# Patient Record
Sex: Female | Born: 1960
Health system: Southern US, Community
[De-identification: ages and names within clinical notes are randomized; demographics above are authoritative.]

## PROBLEM LIST (undated history)

## (undated) DIAGNOSIS — N289 Disorder of kidney and ureter, unspecified: Secondary | ICD-10-CM

## (undated) DIAGNOSIS — I82409 Acute embolism and thrombosis of unspecified deep veins of unspecified lower extremity: Secondary | ICD-10-CM

## (undated) DIAGNOSIS — E119 Type 2 diabetes mellitus without complications: Secondary | ICD-10-CM

## (undated) DIAGNOSIS — I509 Heart failure, unspecified: Secondary | ICD-10-CM

## (undated) DIAGNOSIS — J449 Chronic obstructive pulmonary disease, unspecified: Secondary | ICD-10-CM

## (undated) DIAGNOSIS — I10 Essential (primary) hypertension: Secondary | ICD-10-CM

## (undated) DIAGNOSIS — I4719 Other supraventricular tachycardia: Secondary | ICD-10-CM

## (undated) DIAGNOSIS — E1142 Type 2 diabetes mellitus with diabetic polyneuropathy: Secondary | ICD-10-CM

## (undated) DIAGNOSIS — E538 Deficiency of other specified B group vitamins: Secondary | ICD-10-CM

## (undated) DIAGNOSIS — N183 Chronic kidney disease, stage 3 unspecified: Secondary | ICD-10-CM

## (undated) DIAGNOSIS — Z720 Tobacco use: Secondary | ICD-10-CM

## (undated) DIAGNOSIS — R778 Other specified abnormalities of plasma proteins: Secondary | ICD-10-CM

## (undated) DIAGNOSIS — F1010 Alcohol abuse, uncomplicated: Secondary | ICD-10-CM

## (undated) DIAGNOSIS — R001 Bradycardia, unspecified: Secondary | ICD-10-CM

## (undated) DIAGNOSIS — I779 Disorder of arteries and arterioles, unspecified: Secondary | ICD-10-CM

## (undated) DIAGNOSIS — N179 Acute kidney failure, unspecified: Secondary | ICD-10-CM

## (undated) DIAGNOSIS — Z9119 Patient's noncompliance with other medical treatment and regimen: Secondary | ICD-10-CM

## (undated) DIAGNOSIS — J189 Pneumonia, unspecified organism: Secondary | ICD-10-CM

## (undated) DIAGNOSIS — Z91199 Patient's noncompliance with other medical treatment and regimen due to unspecified reason: Secondary | ICD-10-CM

## (undated) DIAGNOSIS — E876 Hypokalemia: Secondary | ICD-10-CM

## (undated) DIAGNOSIS — F32A Depression, unspecified: Secondary | ICD-10-CM

## (undated) DIAGNOSIS — R7989 Other specified abnormal findings of blood chemistry: Secondary | ICD-10-CM

## (undated) DIAGNOSIS — I429 Cardiomyopathy, unspecified: Secondary | ICD-10-CM

## (undated) DIAGNOSIS — K219 Gastro-esophageal reflux disease without esophagitis: Secondary | ICD-10-CM

## (undated) DIAGNOSIS — I472 Ventricular tachycardia: Secondary | ICD-10-CM

## (undated) DIAGNOSIS — G4730 Sleep apnea, unspecified: Secondary | ICD-10-CM

## (undated) DIAGNOSIS — I739 Peripheral vascular disease, unspecified: Secondary | ICD-10-CM

## (undated) DIAGNOSIS — F419 Anxiety disorder, unspecified: Secondary | ICD-10-CM

## (undated) DIAGNOSIS — E1165 Type 2 diabetes mellitus with hyperglycemia: Secondary | ICD-10-CM

## (undated) DIAGNOSIS — F1410 Cocaine abuse, uncomplicated: Secondary | ICD-10-CM

## (undated) DIAGNOSIS — I2510 Atherosclerotic heart disease of native coronary artery without angina pectoris: Secondary | ICD-10-CM

## (undated) DIAGNOSIS — F1210 Cannabis abuse, uncomplicated: Secondary | ICD-10-CM

## (undated) DIAGNOSIS — I998 Other disorder of circulatory system: Secondary | ICD-10-CM

## (undated) DIAGNOSIS — R0600 Dyspnea, unspecified: Secondary | ICD-10-CM

## (undated) DIAGNOSIS — D649 Anemia, unspecified: Secondary | ICD-10-CM

## (undated) DIAGNOSIS — K7030 Alcoholic cirrhosis of liver without ascites: Secondary | ICD-10-CM

## (undated) DIAGNOSIS — I4729 Other ventricular tachycardia: Secondary | ICD-10-CM

## (undated) DIAGNOSIS — I480 Paroxysmal atrial fibrillation: Secondary | ICD-10-CM

## (undated) DIAGNOSIS — E669 Obesity, unspecified: Secondary | ICD-10-CM

## (undated) DIAGNOSIS — M1990 Unspecified osteoarthritis, unspecified site: Secondary | ICD-10-CM

## (undated) DIAGNOSIS — E785 Hyperlipidemia, unspecified: Secondary | ICD-10-CM

## (undated) DIAGNOSIS — I5042 Chronic combined systolic (congestive) and diastolic (congestive) heart failure: Secondary | ICD-10-CM

## (undated) DIAGNOSIS — E039 Hypothyroidism, unspecified: Secondary | ICD-10-CM

## (undated) DIAGNOSIS — I70229 Atherosclerosis of native arteries of extremities with rest pain, unspecified extremity: Secondary | ICD-10-CM

## (undated) DIAGNOSIS — I471 Supraventricular tachycardia: Secondary | ICD-10-CM

## (undated) DIAGNOSIS — L0390 Cellulitis, unspecified: Secondary | ICD-10-CM

## (undated) DIAGNOSIS — F1110 Opioid abuse, uncomplicated: Secondary | ICD-10-CM

## (undated) DIAGNOSIS — F329 Major depressive disorder, single episode, unspecified: Secondary | ICD-10-CM

## (undated) HISTORY — DX: Cardiomyopathy, unspecified: I42.9

## (undated) HISTORY — DX: Obesity, unspecified: E66.9

## (undated) HISTORY — DX: Alcohol abuse, uncomplicated: F10.10

## (undated) HISTORY — DX: Hypomagnesemia: E83.42

## (undated) HISTORY — DX: Supraventricular tachycardia: I47.1

## (undated) HISTORY — DX: Disorder of kidney and ureter, unspecified: N28.9

## (undated) HISTORY — DX: Patient's noncompliance with other medical treatment and regimen due to unspecified reason: Z91.199

## (undated) HISTORY — DX: Other ventricular tachycardia: I47.29

## (undated) HISTORY — DX: Cocaine abuse, uncomplicated: F14.10

## (undated) HISTORY — DX: Atherosclerosis of native arteries of extremities with rest pain, unspecified extremity: I70.229

## (undated) HISTORY — DX: Paroxysmal atrial fibrillation: I48.0

## (undated) HISTORY — DX: Cannabis abuse, uncomplicated: F12.10

## (undated) HISTORY — DX: Patient's noncompliance with other medical treatment and regimen: Z91.19

## (undated) HISTORY — DX: Opioid abuse, uncomplicated: F11.10

## (undated) HISTORY — DX: Chronic obstructive pulmonary disease, unspecified: J44.9

## (undated) HISTORY — DX: Other supraventricular tachycardia: I47.19

## (undated) HISTORY — DX: Peripheral vascular disease, unspecified: I73.9

## (undated) HISTORY — DX: Ventricular tachycardia: I47.2

## (undated) HISTORY — DX: Deficiency of other specified B group vitamins: E53.8

## (undated) HISTORY — DX: Disorder of arteries and arterioles, unspecified: I77.9

## (undated) HISTORY — DX: Anemia, unspecified: D64.9

## (undated) HISTORY — DX: Type 2 diabetes mellitus without complications: E11.9

## (undated) HISTORY — PX: CARDIAC CATHETERIZATION: SHX172

## (undated) HISTORY — DX: Other disorder of circulatory system: I99.8

## (undated) HISTORY — DX: Chronic combined systolic (congestive) and diastolic (congestive) heart failure: I50.42

## (undated) HISTORY — DX: Hypokalemia: E87.6

## (undated) HISTORY — DX: Alcoholic cirrhosis of liver without ascites: K70.30

## (undated) HISTORY — DX: Tobacco use: Z72.0

## (undated) HISTORY — PX: CARDIOVERSION: SHX1299

---

## 1986-11-14 HISTORY — PX: DILATION AND CURETTAGE OF UTERUS: SHX78

## 2013-05-13 ENCOUNTER — Ambulatory Visit: Payer: Medicaid Other | Attending: Family Medicine | Admitting: Family Medicine

## 2013-05-13 ENCOUNTER — Encounter: Payer: Self-pay | Admitting: Family Medicine

## 2013-05-13 VITALS — BP 131/85 | HR 67 | Temp 98.8°F | Resp 18 | Ht 64.0 in | Wt 152.4 lb

## 2013-05-13 DIAGNOSIS — K7030 Alcoholic cirrhosis of liver without ascites: Secondary | ICD-10-CM | POA: Insufficient documentation

## 2013-05-13 DIAGNOSIS — I429 Cardiomyopathy, unspecified: Secondary | ICD-10-CM

## 2013-05-13 DIAGNOSIS — F17200 Nicotine dependence, unspecified, uncomplicated: Secondary | ICD-10-CM

## 2013-05-13 DIAGNOSIS — F1110 Opioid abuse, uncomplicated: Secondary | ICD-10-CM | POA: Insufficient documentation

## 2013-05-13 DIAGNOSIS — F1210 Cannabis abuse, uncomplicated: Secondary | ICD-10-CM | POA: Insufficient documentation

## 2013-05-13 DIAGNOSIS — E1165 Type 2 diabetes mellitus with hyperglycemia: Secondary | ICD-10-CM

## 2013-05-13 DIAGNOSIS — Z720 Tobacco use: Secondary | ICD-10-CM

## 2013-05-13 DIAGNOSIS — J449 Chronic obstructive pulmonary disease, unspecified: Secondary | ICD-10-CM

## 2013-05-13 DIAGNOSIS — E1169 Type 2 diabetes mellitus with other specified complication: Secondary | ICD-10-CM | POA: Insufficient documentation

## 2013-05-13 DIAGNOSIS — F1010 Alcohol abuse, uncomplicated: Secondary | ICD-10-CM | POA: Insufficient documentation

## 2013-05-13 DIAGNOSIS — F1310 Sedative, hypnotic or anxiolytic abuse, uncomplicated: Secondary | ICD-10-CM

## 2013-05-13 DIAGNOSIS — I5042 Chronic combined systolic (congestive) and diastolic (congestive) heart failure: Secondary | ICD-10-CM

## 2013-05-13 DIAGNOSIS — E1151 Type 2 diabetes mellitus with diabetic peripheral angiopathy without gangrene: Secondary | ICD-10-CM | POA: Insufficient documentation

## 2013-05-13 DIAGNOSIS — I428 Other cardiomyopathies: Secondary | ICD-10-CM

## 2013-05-13 DIAGNOSIS — R0609 Other forms of dyspnea: Secondary | ICD-10-CM | POA: Insufficient documentation

## 2013-05-13 DIAGNOSIS — E119 Type 2 diabetes mellitus without complications: Secondary | ICD-10-CM | POA: Insufficient documentation

## 2013-05-13 DIAGNOSIS — I4891 Unspecified atrial fibrillation: Secondary | ICD-10-CM

## 2013-05-13 MED ORDER — POTASSIUM CHLORIDE ER 10 MEQ PO TBCR: 10.0000 meq | EXTENDED_RELEASE_TABLET | Freq: Every day | ORAL | Status: DC

## 2013-05-13 NOTE — Progress Notes (Signed)
Pt here to establish new pt care. Pt is from [facility], with extensive medical hx. Is now living with sister to help with care. C/o nausea after eating. vss

## 2013-05-13 NOTE — Patient Instructions (Addendum)
Cirrhosis Cirrhosis is a condition of scarring of the liver which is caused when the liver has tried repairing itself following damage. This damage may come from a previous infection such as one of the forms of hepatitis (usually hepatitis C), or the damage may come from being injured by toxins. The main toxin that causes this damage is alcohol. The scarring of the liver from use of alcohol is irreversible. That means the liver cannot return to normal even though alcohol is not used any more. The main danger of hepatitis C infection is that it may cause long-lasting (chronic) liver disease, and this also may lead to cirrhosis. This complication is progressive and irreversible. CAUSES  Prior to available blood tests, hepatitis C could be contracted by blood transfusions. Since testing of blood has improved, this is now unlikely. This infection can also be contracted through intravenous drug use and the sharing of needles. It can also be contracted through sexual relationships. The injury caused by alcohol comes from too much use. It is not a few drinks that poison the liver, but years of misuse. Usually there will be some signs and symptoms early with scarring of the liver that suggest the development of better habits. Alcohol should never be used while using acetaminophen. A small dose of both taken together may cause irreversible damage to the liver. HOME CARE INSTRUCTIONS  There is no specific treatment for cirrhosis. However, there are things you can do to avoid making the condition worse.  Rest as needed.  Eat a well-balanced diet. Your caregiver can help you with suggestions.  Vitamin supplements including vitamins A, K, D, and thiamine can help.  A low-salt diet, water restriction, or diuretic medicine may be needed to reduce fluid retention.  Avoid alcohol. This can be extremely toxic if combined with acetaminophen.  Avoid drugs which are toxic to the liver. Some of these include isoniazid,  methyldopa, acetaminophen, anabolic steroids (muscle-building drugs), erythromycin, and oral contraceptives (birth control pills). Check with your caregiver to make sure medicines you are presently taking will not be harmful.  Periodic blood tests may be required. Follow your caregiver's advice regarding the timing of these.  Milk thistle is an herbal remedy which does protect the liver against toxins. However, it will not help once the liver has been scarred. SEEK MEDICAL CARE IF:  You have increasing fatigue or weakness.  You develop swelling of the hands, feet, legs, or face.  You vomit bright red blood, or a coffee ground appearing material.  You have blood in your stools, or the stools turn black and tarry.  You have a fever.  You develop loss of appetite, or have nausea and vomiting.  You develop jaundice.  You develop easy bruising or bleeding.  You have worsening of any of the problems you are concerned about. Document Released: 10/31/2005 Document Revised: 01/23/2012 Document Reviewed: 06/18/2008 [facility] Patient Information 2014 [place]. Smoking Cessation, Tips for Success YOU CAN QUIT SMOKING If you are ready to quit smoking, congratulations! You have chosen to help yourself be healthier. Cigarettes bring nicotine, tar, carbon monoxide, and other irritants into your body. Your lungs, heart, and blood vessels will be able to work better without these poisons. There are many different ways to quit smoking. Nicotine gum, nicotine patches, a nicotine inhaler, or nicotine nasal spray can help with physical craving. Hypnosis, support groups, and medicines help break the habit of smoking. Here are some tips to help you quit for good.  Throw away all  cigarettes.  Clean and remove all ashtrays from your home, work, and car.  On a card, write down your reasons for quitting. Carry the card with you and read it when you get the urge to smoke.  Cleanse your body of nicotine.  Drink enough water and fluids to keep your urine clear or pale yellow. Do this after quitting to flush the nicotine from your body.  Learn to predict your moods. Do not let a bad situation be your excuse to have a cigarette. Some situations in your life might tempt you into wanting a cigarette.  Never have "just one" cigarette. It leads to wanting another and another. Remind yourself of your decision to quit.  Change habits associated with smoking. If you smoked while driving or when feeling stressed, try other activities to replace smoking. Stand up when drinking your coffee. Brush your teeth after eating. Sit in a different chair when you read the paper. Avoid alcohol while trying to quit, and try to drink fewer caffeinated beverages. Alcohol and caffeine may urge you to smoke.  Avoid foods and drinks that can trigger a desire to smoke, such as sugary or spicy foods and alcohol.  Ask people who smoke not to smoke around you.  Have something planned to do right after eating or having a cup of coffee. Take a walk or exercise to perk you up. This will help to keep you from overeating.  Try a relaxation exercise to calm you down and decrease your stress. Remember, you may be tense and nervous for the first 2 weeks after you quit, but this will pass.  Find new activities to keep your hands busy. Play with a pen, coin, or rubber band. Doodle or draw things on paper.  Brush your teeth right after eating. This will help cut down on the craving for the taste of tobacco after meals. You can try mouthwash, too.  Use oral substitutes, such as lemon drops, carrots, a cinnamon stick, or chewing gum, in place of cigarettes. Keep them handy so they are available when you have the urge to smoke.  When you have the urge to smoke, try deep breathing.  Designate your home as a nonsmoking area.  If you are a heavy smoker, ask your caregiver about a prescription for nicotine chewing gum. It can ease your  withdrawal from nicotine.  Reward yourself. Set aside the cigarette money you save and buy yourself something nice.  Look for support from others. Join a support group or smoking cessation program. Ask someone at home or at work to help you with your plan to quit smoking.  Always ask yourself, "Do I need this cigarette or is this just a reflex?" Tell yourself, "Today, I choose not to smoke," or "I do not want to smoke." You are reminding yourself of your decision to quit, even if you do smoke a cigarette. HOW WILL I FEEL WHEN I QUIT SMOKING?  The benefits of not smoking start within days of quitting.  You may have symptoms of withdrawal because your body is used to nicotine (the addictive substance in cigarettes). You may crave cigarettes, be irritable, feel very hungry, cough often, get headaches, or have difficulty concentrating.  The withdrawal symptoms are only temporary. They are strongest when you first quit but will go away within 10 to 14 days.  When withdrawal symptoms occur, stay in control. Think about your reasons for quitting. Remind yourself that these are signs that your body is healing and getting  used to being without cigarettes.  Remember that withdrawal symptoms are easier to treat than the major diseases that smoking can cause.  Even after the withdrawal is over, expect periodic urges to smoke. However, these cravings are generally short-lived and will go away whether you smoke or not. Do not smoke!  If you relapse and smoke again, do not lose hope. Most smokers quit 3 times before they are successful.  If you relapse, do not give up! Plan ahead and think about what you will do the next time you get the urge to smoke. LIFE AS A NONSMOKER: MAKE IT FOR A MONTH, MAKE IT FOR LIFE Day 1: Hang this page where you will see it every day. Day 2: Get rid of all ashtrays, matches, and lighters. Day 3: Drink water. Breathe deeply between sips. Day 4: Avoid places with smoke-filled  air, such as bars, clubs, or the smoking section of restaurants. Day 5: Keep track of how much money you save by not smoking. Day 6: Avoid boredom. Keep a good book with you or go to the movies. Day 7: Reward yourself! One week without smoking! Day 8: Make a dental appointment to get your teeth cleaned. Day 9: Decide how you will turn down a cigarette before it is offered to you. Day 10: Review your reasons for quitting. Day 11: Distract yourself. Stay active to keep your mind off smoking and to relieve tension. Take a walk, exercise, read a book, do a crossword puzzle, or try a new hobby. Day 12: Exercise. Get off the bus before your stop or use stairs instead of escalators. Day 13: Call on friends for support and encouragement. Day 14: Reward yourself! Two weeks without smoking! Day 15: Practice deep breathing exercises. Day 16: Bet a friend that you can stay a nonsmoker. Day 17: Ask to sit in nonsmoking sections of restaurants. Day 18: Hang up "No Smoking" signs. Day 19: Think of yourself as a nonsmoker. Day 20: Each morning, tell yourself you will not smoke. Day 21: Reward yourself! Three weeks without smoking! Day 22: Think of smoking in negative ways. Remember how it stains your teeth, gives you bad breath, and leaves you short of breath. Day 23: Eat a nutritious breakfast. Day 24:Do not relive your days as a smoker. Day 25: Hold a pencil in your hand when talking on the telephone. Day 26: Tell all your friends you do not smoke. Day 27: Think about how much better food tastes. Day 28: Remember, one cigarette is one too many. Day 29: Take up a hobby that will keep your hands busy. Day 30: Congratulations! One month without smoking! Give yourself a big reward. Your caregiver can direct you to community resources or hospitals for support, which may include:  Group support.  Education.  Hypnosis.  Subliminal therapy. Document Released: 07/29/2004 Document Revised: 01/23/2012  Document Reviewed: 08/17/2009 [facility] Patient Information 2014 [place], [place]. Cardiomyopathy Cardiomyopathy means a disease of the heart muscle. The heart muscle becomes enlarged or stiff. The heart is not able to pump enough blood or deliver enough oxygen to the body. This leads to heart failure and is the number one reason for heart transplants.  TYPES OF CARDIOMYOPATHY INCLUDE: DILATED  The most common type. The heart muscle is stretched out and weak so there is less blood pumped out.   Some causes:  Disease of the arteries of the heart (ischemia).  Heart attack with muscle scar.  Leaky or damaged valves.  After a viral illness.  Smoking.  High cholesterol.  Diabetes or overactive thyroid.  Alcohol or drug abuse.  High blood pressure.  May be reversible. HYPERTROPHIC The heart muscle grows bigger so there is less room for blood in the ventricle, and not enough blood is pumped out.   Causes include:  Mitral valve leaks.  Inherited tendency (from your family).  No explanation (idiopathic).  May be a cause of sudden death in young athletes with no symptoms. RESTRICTIVE The heart muscle becomes stiff, but not always larger. The heart has to work harder and will get weaker. Abnormal heart beats or rhythm (arrhythmia) are common.  Some causes:  Diseases in other parts of the body which may produce abnormal deposits in the heart muscle.  Probably not inherited.  A result of radiation treatment for cancer. SYMPTOMS OF ALL TYPES:  Less able to exercise or tolerate physical activity.  Palpitations.  Irregular heart beat, heart arrhythmias.  Shortness of breath, even at rest.  Chest pain.  Lightheadedness or fainting. TREATMENT  Life-style changes including reducing salt, lowering cholesterol, stop smoking.  Manage contributing causes with medications.  Medicines to help reduce the fluids in the body.  An implanted cardioverter defibrillator (ICD) to  improve heart function and correct arrhythmias.  Medications to relax the blood vessels and make it easier for the heart to pump.  Drugs that help regulate heart beat and improve heart relaxation, reducing the work of the heart.  Myomectomy for patients with hypertrophic cardiomyopathy and severe problems. This is a surgical procedure that removes a portion of the thickened muscle wall in order to improve heart output and provide symptom relief.  A heart transplant is an option in carefully applied circumstances. SEEK IMMEDIATE MEDICAL CARE IF:   You have severe chest pain, especially if the pain is crushing or pressure-like and spreads to the arms, back, neck, or jaw, or if you have sweating, feeling sick to your stomach (nausea), or shortness of breath. THIS IS AN EMERGENCY. Do not wait to see if the pain will go away. Get medical help at once. Call your local emergency services (911 in [place] DO NOT drive yourself to the hospital.  You develop severe shortness of breath.  You begin to cough up bloody sputum.  You are unable to sleep because you cannot breathe.  You gain weight due to fluid retention.  You develop painful swelling in your calf or leg.  You feel your heart racing and it does not go away or happens when you are resting. Document Released: 01/13/2005 Document Revised: 01/23/2012 Document Reviewed: 06/18/2008 [facility] Patient Information 2014 [place]. Heart Failure Heart failure means your heart has trouble pumping blood. This makes it hard for your body to work well. Heart failure is a long-term (chronic) condition. You must take good care of yourself and follow your doctor's treatment plan. HOME CARE  Take your heart medicine as told by your doctor.  Do not stop taking medicine unless your doctor tells you to.  Do not skip any dose of medicine.  Refill your medicines before they run out.  Only take other medicines after your doctor approves.  Stay  active and follow your activity program as told by your doctor.  Rest for 1 hour before and after meals.  Eat heart healthy foods. This includes fresh or frozen fruits and vegetables, fish, lean meats, fat-free or low-fat dairy foods, whole grains, and high-fiber foods.  Do not eat more than 1500 milligrams of salt each day or as told  by your doctor.  Cook in a healthy way. Roast, grill, broil, bake, poach, steam, or stir-fry foods.  Limit fluids as told by your doctor.  Weigh yourself every morning. Do this after you pee (urinate) and before you eat breakfast. Write down your weight to give to your doctor.  Take your blood pressure and write it down if your doctor tell you to.  Ask your doctor how to check your pulse. Check your pulse as told.  Lose weight if you are overweight. Maintain a healthy weight.  Stop smoking or chewing tobacco. Do not use gum or patches that help you quit without your doctor's approval.  Schedule and go to doctor visits as told.  Nonpregnant women should have no more than 1 drink a day. Men should have no more than 2 drinks a day. Talk to your doctor about drinking alcohol.  Stop drug use.  Stay current with shots (immunizations).  Manage your health conditions as told by your doctor.  Learn to manage your stress.  Rest when you are tired.  If it is really hot outside:  Avoid intense activities.  Use air conditioning or fans, or get in a cooler place.  Avoid caffeine and alcohol.  Wear loose-fitting, lightweight, and light-colored clothing.  If it is really cold outside:  Avoid intense activities.  Layer your clothing.  Wear mittens or gloves, a hat, and a scarf when going outside.  Avoid alcohol.  Learn about heart failure and get support as needed.  Get help to maintain or improve your quality of life and your ability to care for yourself as needed. GET HELP IF:   You gain 3 lb/1.4 kg or more in 1 day or 5 lb/2.3 kg in a  week.  You are more short of breath than usual.  You cannot do your normal activities.  You tire easily.  You cough more than normal, especially with activity.  You have any or more puffiness (swelling) in areas such as your hands, feet, ankles, or belly (abdomen).  You cannot sleep because it is hard to breathe.  You cough up thick spit (mucus) that is bloody.  You feel like your heart is beating fast (palpitations).  You get dizzy or lightheaded when you stand up. GET HELP RIGHT AWAY IF:   You have trouble breathing.  There is a change in mental status, such as becoming less alert or not being able to focus.  You have chest pain or discomfort.  You faint.  You were outside in hot weather and show signs of your body overheating (heat exhaustion):  Heavy sweating.  Muscle cramps.  Weakness.  Dizziness.  Headaches.  Fainting.  You were outside in cold weather and show signs of low body temperature (hypothermia):  Clumsiness.  Confusion.  Sleepiness.  Shivering. MAKE SURE YOU:   Understand these instructions.  Will watch your condition.  Will get help right away if you are not doing well or get worse. Document Released: 08/09/2008 Document Revised: 10/17/2012 Document Reviewed: 05/31/2012 [facility] Patient Information 2014 [place], [place].

## 2013-05-13 NOTE — Progress Notes (Signed)
Quick Note:  Pt already notified in person: Continue current dose of warfarin and recheck on Thursday INR  Gerlene Fee, MD, CDE, [place], [place]   ______

## 2013-05-13 NOTE — Progress Notes (Signed)
05/13/13 Already taken care of per Dr. Wynetta Emery. P.Abella Shugart,RN BSN [facility]

## 2013-05-13 NOTE — Progress Notes (Signed)
Patient ID: [name], female   DOB: Mar 06, 1961, 52 y.o.   MRN: [id number]  CC: Hospital followup  HPI: The patient was recently discharged from a hospital stay in [place].  She was very ill.  She had a cardiac arrest and required resuscitation.  She was also found to have A. fib with RVR and started on warfarin.  She has a history of polysubstance abuse.  She has alcoholic cirrhosis.  She has uncontrolled diabetes mellitus which was discovered in the hospital.  She is now living in the area with her sister.  Her sister is helping her.  She has not establish cardiology care since she has been in [place].  She reports no symptoms.  She reports that she is taking her diuretics but she is losing continence of urine on the diuretics.  She is currently taking Lasix and spironolactone.  She's also taking amiodarone.  The patient is a very poor historian.  She did bring some medical records with her today.  No Known Allergies Past Medical History  Diagnosis Date  . COPD (chronic obstructive pulmonary disease)   . Alcohol abuse   . Narcotic abuse   . Cocaine abuse   . Marijuana abuse   . Poorly controlled diabetes mellitus   . COPD (chronic obstructive pulmonary disease)   . Tobacco abuse   . Atrial fibrillation   . Heart failure   . Alcoholic cirrhosis   . Cardiomyopathy    No current outpatient prescriptions on file prior to visit.   No current facility-administered medications on file prior to visit.   Family History  Problem Relation Age of Onset  . Hypertension Mother   . Hypertension Father   . Diabetes Mother   . Cancer Mother     ovarian   History   Social History  . Marital Status: Divorced    Spouse Name: N/A    Number of Children: N/A  . Years of Education: N/A   Occupational History  . disabled    Social History Main Topics  . Smoking status: Current Every Day Smoker    Types: Cigarettes  . Smokeless tobacco: Not on file  . Alcohol Use: Yes  . Drug  Use: Yes  . Sexually Active: Not Currently   Other Topics Concern  . Not on file   Social History Narrative  . No narrative on file    Review of Systems  Constitutional: Negative for fever, chills, diaphoresis, activity change, reports poor appetite change and chronic fatigue.  HENT: Negative for ear pain, nosebleeds, congestion, facial swelling, rhinorrhea, neck pain, neck stiffness and ear discharge.   Eyes: Negative for pain, discharge, redness, itching and visual disturbance.  Respiratory: Negative for cough, choking, chest tightness, shortness of breath, wheezing and stridor.   Cardiovascular: Negative for chest pain, palpitations and leg swelling.  Gastrointestinal: Negative for abdominal distention.  Genitourinary: Negative for dysuria, urgency, frequency, hematuria, flank pain, decreased urine volume, difficulty urinating and dyspareunia.  Musculoskeletal: Negative for back pain, joint swelling, arthralgias and gait problem.  edema in the lower extremities Neurological: Negative for dizziness, tremors, seizures, syncope, facial asymmetry, speech difficulty, weakness, light-headedness, numbness and headaches.  Hematological: Negative for adenopathy. Does not bruise/bleed easily.  Psychiatric/Behavioral: Negative for hallucinations, behavioral problems, confusion, dysphoric mood, decreased concentration and agitation.    Objective:   Filed Vitals:   05/13/13 1145  BP: 131/85  Pulse: 67  Temp: 98.8 F (37.1 C)  Resp: 18    Physical Exam  Constitutional:  Appears chronically ill, well-developed and well-nourished. No distress.  HENT: Normocephalic. External right and left ear normal. Oropharynx is clear and moist.  Eyes: Conjunctivae and EOM are normal. PERRLA, no scleral icterus.  Neck: Normal ROM. Neck supple. No JVD. No tracheal deviation. No thyromegaly.  CVS: RRR, S1/S2 +, no murmurs, no gallops, no carotid bruit.  Pulmonary: Effort and breath sounds normal, no  stridor, rhonchi, wheezes, rales.  Abdominal: Soft. BS +,  no distension, tenderness, rebound or guarding.  Musculoskeletal: Contracted skin in the lower extremities from rapid loss of fluid, chronic venous stasis changes in both lower extremities.  Lymphadenopathy: No lymphadenopathy noted, cervical, inguinal. Neuro: Alert. Normal reflexes, muscle tone coordination. No cranial nerve deficit. Skin: Skin is warm and dry. No rash noted. Not diaphoretic. No erythema. No pallor.  Psychiatric: Normal mood and affect. Behavior, judgment, thought content normal.   No results found for this basename: WBC, HGB, HCT, MCV, PLT   No results found for this basename: CREATININE, BUN, NA, K, CL, CO2   No results found for this basename: HGBA1C   Lipid Panel  No results found for this basename: chol, trig, hdl, cholhdl, vldl, ldlcalc     Assessment and plan:   Patient Active Problem List   Diagnosis Date Noted  . COPD (chronic obstructive pulmonary disease)   . Alcohol abuse   . Narcotic abuse   . Marijuana abuse   . Cardiomyopathy   . Alcoholic cirrhosis   . Heart failure   . Atrial fibrillation   . Tobacco abuse   . Poorly controlled diabetes mellitus    Check PT/INR today Check for discharge summary Referral to cardiology and GI for evaluation.  She will need close followup with a local cardiologist given the complexity of her heart disease.  At this time we'll continue the current medications that she is on.  Monitor PT/INR.  RTC in 2 weeks for office visit  Recheck PT/INR in 3 days  The patient was given clear instructions to go to ER or return to medical center if symptoms don't improve, worsen or new problems develop.  The patient verbalized understanding.  The patient was told to call to get any lab results if not heard anything in the next week.    Gerlene Fee, MD, CDE, [place], [place]

## 2013-05-16 ENCOUNTER — Ambulatory Visit: Payer: Self-pay | Attending: Family Medicine

## 2013-05-16 NOTE — Progress Notes (Unsigned)
Patient ID: [name], female   DOB: Jun 07, 1961, 52 y.o.   MRN: [id number]  INR subtherapeutic at 1.5 Instructed to take 5 mg in 2.5 mg every other day, patient instructed she 5 mg today on 7/3 Repeat INR 7/7

## 2013-05-20 ENCOUNTER — Ambulatory Visit: Payer: Medicaid Other | Attending: Family Medicine

## 2013-05-20 DIAGNOSIS — Z7901 Long term (current) use of anticoagulants: Secondary | ICD-10-CM

## 2013-05-20 LAB — POCT INR: INR: 1.7

## 2013-05-20 NOTE — Patient Instructions (Signed)
Patient instructed to start taking 5 mg per day and come back Thursday to repeat INR per Dr. Wynelle Cleveland.

## 2013-05-23 ENCOUNTER — Ambulatory Visit: Payer: Medicaid Other | Attending: Family Medicine

## 2013-05-23 DIAGNOSIS — E119 Type 2 diabetes mellitus without complications: Secondary | ICD-10-CM

## 2013-05-23 DIAGNOSIS — E1165 Type 2 diabetes mellitus with hyperglycemia: Secondary | ICD-10-CM

## 2013-05-23 DIAGNOSIS — Z7901 Long term (current) use of anticoagulants: Secondary | ICD-10-CM | POA: Insufficient documentation

## 2013-05-23 DIAGNOSIS — K7030 Alcoholic cirrhosis of liver without ascites: Secondary | ICD-10-CM

## 2013-05-23 LAB — BASIC METABOLIC PANEL
BUN: 38 mg/dL — ABNORMAL HIGH (ref 6–23)
Chloride: 95 mEq/L — ABNORMAL LOW (ref 96–112)
Creat: 0.99 mg/dL (ref 0.50–1.10)

## 2013-05-23 NOTE — Patient Instructions (Addendum)
INR 1.6  Pt instructed to take 2 pills per day for a total of 5 mg. Per Dr. Candiss Norse.  To return Monday during office visit to repeat INR.

## 2013-05-24 NOTE — Progress Notes (Signed)
Quick Note:  Please call patient for a visit within 2-3 days INR is low in electrolytes are abnormal. Stop any potassium supplements immediately. ______

## 2013-05-24 NOTE — Progress Notes (Signed)
Quick Note:  Please ask patient to stop Aldactone, potassium supplements immediately and to come back in the clinic within 2-3 days. ______

## 2013-05-27 ENCOUNTER — Ambulatory Visit: Payer: Medicaid Other | Attending: Family Medicine | Admitting: Internal Medicine

## 2013-05-27 ENCOUNTER — Encounter: Payer: Self-pay | Admitting: Internal Medicine

## 2013-05-27 VITALS — BP 125/78 | HR 78 | Temp 99.2°F | Resp 16 | Ht 64.0 in | Wt 154.0 lb

## 2013-05-27 DIAGNOSIS — J4489 Other specified chronic obstructive pulmonary disease: Secondary | ICD-10-CM | POA: Insufficient documentation

## 2013-05-27 DIAGNOSIS — K7030 Alcoholic cirrhosis of liver without ascites: Secondary | ICD-10-CM | POA: Insufficient documentation

## 2013-05-27 DIAGNOSIS — E119 Type 2 diabetes mellitus without complications: Secondary | ICD-10-CM | POA: Insufficient documentation

## 2013-05-27 DIAGNOSIS — J449 Chronic obstructive pulmonary disease, unspecified: Secondary | ICD-10-CM | POA: Insufficient documentation

## 2013-05-27 DIAGNOSIS — I428 Other cardiomyopathies: Secondary | ICD-10-CM | POA: Insufficient documentation

## 2013-05-27 DIAGNOSIS — Z87891 Personal history of nicotine dependence: Secondary | ICD-10-CM | POA: Insufficient documentation

## 2013-05-27 DIAGNOSIS — I10 Essential (primary) hypertension: Secondary | ICD-10-CM

## 2013-05-27 DIAGNOSIS — Z7901 Long term (current) use of anticoagulants: Secondary | ICD-10-CM | POA: Insufficient documentation

## 2013-05-27 DIAGNOSIS — I4891 Unspecified atrial fibrillation: Secondary | ICD-10-CM | POA: Insufficient documentation

## 2013-05-27 LAB — BASIC METABOLIC PANEL
BUN: 30 mg/dL — ABNORMAL HIGH (ref 6–23)
Calcium: 9.1 mg/dL (ref 8.4–10.5)
Glucose, Bld: 350 mg/dL — ABNORMAL HIGH (ref 70–99)
Potassium: 4.8 mEq/L (ref 3.5–5.3)

## 2013-05-27 LAB — POCT INR: INR: 2.4

## 2013-05-27 NOTE — Progress Notes (Signed)
Patient presents for FU for anticoagulation therapy and INR.

## 2013-05-27 NOTE — Progress Notes (Signed)
Patient ID: [name], female   DOB: 06-03-1961, 52 y.o.   MRN: [id number]  Patient Demographics  [name], is a 52 y.o. female  [id number]  [id number]  DOB - 07/13/1961  Chief Complaint  Patient presents with  . Follow-up  . Warfarin Sensitivity    INR check        Subjective:   [name] with History of fibrillation on Coumadin, chronic narcotic abuse, ongoing smoking counseled to quit smoking, COPD, alcoholic cirrhosis, hypertension  is here for INR check.  Denies any subjective complaints except as above, no active headache, no chest abdominal pain at this time, not short of breath. No focal weakness which is new.   Objective:    Patient Active Problem List   Diagnosis Date Noted  . Long term (current) use of anticoagulants 05/20/2013  . COPD (chronic obstructive pulmonary disease)   . Alcohol abuse   . Narcotic abuse   . Marijuana abuse   . Cardiomyopathy   . Alcoholic cirrhosis   . Heart failure   . Atrial fibrillation   . Tobacco abuse   . Poorly controlled diabetes mellitus      Filed Vitals:   05/27/13 1052  BP: 125/78  Pulse: 78  Temp: 99.2 F (37.3 C)  TempSrc: Oral  Resp: 16  Height: 5\' 4"  (1.626 m)  Weight: 154 lb (69.854 kg)  SpO2: 97%     Exam   Awake Alert, Oriented X 3, No new F.N deficits, Normal affect [place].AT,PERRAL Supple Neck,No JVD, No cervical lymphadenopathy appriciated.  Symmetrical Chest wall movement, Good air movement bilaterally, CTAB RRR,No Gallops,Rubs or new Murmurs, No Parasternal Heave +ve B.Sounds, Abd Soft, Non tender, No organomegaly appriciated, No rebound - guarding or rigidity. No Cyanosis, Clubbing or edema, No new Rash or bruise       Data Review   CBC No results found for this basename: WBC, HGB, HCT, PLT, MCV, MCH, MCHC, RDW, NEUTRABS, LYMPHSABS, MONOABS, EOSABS, BASOSABS, BANDABS, BANDSABD,  in the last 168 hours  Chemistries    Recent Labs Lab 05/23/13 1011  NA 131*  K 5.2   CL 95*  CO2 29  GLUCOSE 250*  BUN 38*  CREATININE 0.99  CALCIUM 9.2   ------------------------------------------------------------------------------------------------------------------ No results found for this basename: HGBA1C,  in the last 72 hours ------------------------------------------------------------------------------------------------------------------ No results found for this basename: CHOL, HDL, LDLCALC, TRIG, CHOLHDL, LDLDIRECT,  in the last 72 hours ------------------------------------------------------------------------------------------------------------------ No results found for this basename: TSH, T4TOTAL, FREET3, T3FREE, THYROIDAB,  in the last 72 hours ------------------------------------------------------------------------------------------------------------------ No results found for this basename: VITAMINB12, FOLATE, FERRITIN, TIBC, IRON, RETICCTPCT,  in the last 72 hours  Coagulation profile   Recent Labs Lab 05/23/13 1013  INR 1.6       Prior to Admission medications   Medication Sig Start Date End Date Taking? Authorizing Provider  carvedilol (COREG) 6.25 MG tablet Take 6.25 mg by mouth 2 (two) times daily with a meal.   Yes Historical Provider, MD  doxycycline (VIBRAMYCIN) 100 MG capsule Take 100 mg by mouth 2 (two) times daily.   Yes Historical Provider, MD  furosemide (LASIX) 40 MG tablet Take 40 mg by mouth.   Yes Historical Provider, MD  magnesium oxide (MAG-OX) 400 MG tablet Take 400 mg by mouth daily.   Yes Historical Provider, MD  metFORMIN (GLUCOPHAGE) 1000 MG tablet Take 1,000 mg by mouth 2 (two) times daily with a meal.   Yes Historical Provider, MD  predniSONE (DELTASONE) 10 MG tablet Take  10 mg by mouth daily.   Yes Historical Provider, MD  warfarin (COUMADIN) 2.5 MG tablet Take 2.5 mg by mouth daily.   Yes Historical Provider, MD     Assessment & Plan   Atrial fibrillation on Coumadin. INR is therapeutic now, dose was recently  increased, patient has been asked to come back in 3 days for repeat INR and dose adjustment if needed.   Top normal potassium. She was never taking lisinopril however she was on Aldactone and potassium supplements both. Both have been held, she'll continue to take her Lasix per home dose, come back in 3 days and will check BMP today.   Mentioned history of cardiomyopathy. Will check baseline echogram, echogram has been ordered.   History of alcoholic cirrhosis, smoking, narcotic abuse. Counseled to abstain from alcohol and smoking, requested not to miss his narcotics.     Routine health maintenance.    Colonoscopy - referral made    Mammogram, Pap smear - referral made    Immunizations tetanus shot next visit, clinic is out of tetanus shot       Thurnell Lose M.D on 05/27/2013 at 11:03 AM

## 2013-05-29 ENCOUNTER — Telehealth: Payer: Self-pay

## 2013-05-29 NOTE — Telephone Encounter (Signed)
Message copied by Dorothe Pea on Wed May 29, 2013  3:51 PM ------      Message from: [facility]      Created: Wed May 29, 2013  1:11 PM       Please have patient comeback in 2-3 days for another visit, Low Sodium & high sugars ------

## 2013-05-29 NOTE — Telephone Encounter (Signed)
Left message to return our call-we need to bring back in Needs to be seen back in office in the next couple of days

## 2013-05-29 NOTE — Progress Notes (Signed)
Quick Note:  Please have patient comeback in 2-3 days for another visit, Low Sodium & high sugars ______

## 2013-05-30 ENCOUNTER — Ambulatory Visit: Payer: Medicaid Other | Attending: Family Medicine

## 2013-05-30 DIAGNOSIS — Z7901 Long term (current) use of anticoagulants: Secondary | ICD-10-CM

## 2013-05-30 NOTE — Progress Notes (Unsigned)
Patient's INR 2.4. Instructed to continue taking two 2.5 mg pills (5 mg) per day per Dr. Wynetta Emery. Patient is to come back next week. If INR stable on next visit then patient to return ever 3-4 weeks for INR.

## 2013-06-04 ENCOUNTER — Encounter: Payer: Self-pay | Admitting: Obstetrics & Gynecology

## 2013-06-06 ENCOUNTER — Ambulatory Visit: Payer: Medicaid Other | Attending: Family Medicine

## 2013-06-06 VITALS — BP 131/79 | HR 71 | Temp 98.7°F | Resp 16

## 2013-06-06 DIAGNOSIS — I2699 Other pulmonary embolism without acute cor pulmonale: Secondary | ICD-10-CM

## 2013-06-06 NOTE — Progress Notes (Unsigned)
Patient presents for INR for anticoagulation treatment.  Patient to return in one week for repeat of INR and EKG per Dr. Christa See orders.

## 2013-06-06 NOTE — Patient Instructions (Addendum)
Pt to start taking 2.5 mg and return next Thursday per doctor order.

## 2013-06-06 NOTE — Progress Notes (Unsigned)
Pt here for INR level. Vss. Denies pain.taking 2.5 mg coumadin daily

## 2013-06-13 ENCOUNTER — Ambulatory Visit: Payer: Medicaid Other | Attending: Family Medicine | Admitting: Family Medicine

## 2013-06-13 ENCOUNTER — Encounter: Payer: Self-pay | Admitting: Family Medicine

## 2013-06-13 VITALS — BP 129/84 | HR 88 | Temp 97.0°F | Resp 16 | Wt 154.0 lb

## 2013-06-13 DIAGNOSIS — Z7901 Long term (current) use of anticoagulants: Secondary | ICD-10-CM

## 2013-06-13 DIAGNOSIS — F1411 Cocaine abuse, in remission: Secondary | ICD-10-CM

## 2013-06-13 DIAGNOSIS — L0390 Cellulitis, unspecified: Secondary | ICD-10-CM | POA: Insufficient documentation

## 2013-06-13 DIAGNOSIS — I5022 Chronic systolic (congestive) heart failure: Secondary | ICD-10-CM

## 2013-06-13 DIAGNOSIS — E119 Type 2 diabetes mellitus without complications: Secondary | ICD-10-CM | POA: Insufficient documentation

## 2013-06-13 DIAGNOSIS — L0291 Cutaneous abscess, unspecified: Secondary | ICD-10-CM

## 2013-06-13 LAB — GLUCOSE, POCT (MANUAL RESULT ENTRY): POC Glucose: 291 mg/dl — AB (ref 70–99)

## 2013-06-13 MED ORDER — WARFARIN SODIUM 5 MG PO TABS: 5.0000 mg | ORAL_TABLET | Freq: Every day | ORAL | Status: DC

## 2013-06-13 MED ORDER — INSULIN ASPART 100 UNIT/ML ~~LOC~~ SOLN
8.0000 [IU] | Freq: Once | SUBCUTANEOUS | Status: AC
  Administered 2013-06-13: 8 [IU] via SUBCUTANEOUS

## 2013-06-13 MED ORDER — SULFAMETHOXAZOLE-TMP DS 800-160 MG PO TABS: 1.0000 | ORAL_TABLET | Freq: Two times a day (BID) | ORAL | Status: DC

## 2013-06-13 MED ORDER — GLIPIZIDE ER 10 MG PO TB24: 10.0000 mg | ORAL_TABLET | Freq: Every day | ORAL | Status: DC

## 2013-06-13 NOTE — Patient Instructions (Addendum)
Cellulitis  Cellulitis is an infection of the skin and the tissue beneath it. The infected area is usually red and tender. Cellulitis occurs most often in the arms and lower legs.   CAUSES   Cellulitis is caused by bacteria that enter the skin through cracks or cuts in the skin. The most common types of bacteria that cause cellulitis are Staphylococcus and Streptococcus.  SYMPTOMS   · Redness and warmth.  · Swelling.  · Tenderness or pain.  · Fever.  DIAGNOSIS   Your caregiver can usually determine what is wrong based on a physical exam. Blood tests may also be done.  TREATMENT   Treatment usually involves taking an antibiotic medicine.  HOME CARE INSTRUCTIONS   · Take your antibiotics as directed. Finish them even if you start to feel better.  · Keep the infected arm or leg elevated to reduce swelling.  · Apply a warm cloth to the affected area up to 4 times per day to relieve pain.  · Only take over-the-counter or prescription medicines for pain, discomfort, or fever as directed by your caregiver.  · Keep all follow-up appointments as directed by your caregiver.  SEEK MEDICAL CARE IF:   · You notice red streaks coming from the infected area.  · Your red area gets larger or turns dark in color.  · Your bone or joint underneath the infected area becomes painful after the skin has healed.  · Your infection returns in the same area or another area.  · You notice a swollen bump in the infected area.  · You develop new symptoms.  SEEK IMMEDIATE MEDICAL CARE IF:   · You have a fever.  · You feel very sleepy.  · You develop vomiting or diarrhea.  · You have a general ill feeling (malaise) with muscle aches and pains.  MAKE SURE YOU:   · Understand these instructions.  · Will watch your condition.  · Will get help right away if you are not doing well or get worse.  Document Released: 08/10/2005 Document Revised: 05/01/2012 Document Reviewed: 01/16/2012  ExitCare® Patient Information ©2014 [employer].    Chronic  Obstructive Pulmonary Disease  Chronic obstructive pulmonary disease (COPD) is a condition in which airflow from the lungs is restricted. The lungs can never return to normal, but there are measures you can take which will improve them and make you feel better.  CAUSES   · Smoking.  · Exposure to secondhand smoke.  · Breathing in irritants such as air pollution, dust, cigarette smoke, strong odors, aerosol sprays, or paint fumes.  · History of lung infections.  SYMPTOMS   · Deep, persistent (chronic) cough with a large amount of thick mucus.  · Wheezing.  · Shortness of breath, especially with physical activity.  · Feeling like you cannot get enough air.  · Difficulty breathing.  · Rapid breaths (tachypnea).  · Gray or bluish discoloration (cyanosis) of the skin, especially in fingers, toes, or lips.  · Fatigue.  · Weight loss.  · Swelling in legs, ankles, or feet.  · Fast heartbeat (tachycardia).  · Frequent lung infections.  ·  Chest tightness.  DIAGNOSIS   Initial diagnosis may be based on your history, symptoms, and physical examination. Additional tests for COPD may include:  · Chest X-ray.  · Computed tomography (CT) scan.  · Lung (pulmonary) function tests.  · Blood tests.  TREATMENT   Treatment focuses on making you comfortable (supportive care). Your caregiver may prescribe medicines (  inhaled or pills) to help improve your breathing. Additional treatment options may include oxygen therapy and pulmonary rehabilitation. Treatment should also include reducing your exposure to known irritants and following a plan to stop smoking.  HOME CARE INSTRUCTIONS   · Take all medicines, including antibiotic medicines, as directed by your caregiver.  · Use inhaled medicines as directed by your caregiver.  · Avoid medicines or cough syrups that dry up your airway (antihistamines) and slow down the elimination of secretions. This decreases respiratory capacity and may lead to infections.  · If you smoke, stop  smoking.  · Avoid exposure to smoke, chemicals, and fumes that aggravate your breathing.  · Avoid contact with individuals that have a contagious illness.  · Avoid extreme temperature and humidity changes.  · Use humidifiers at home and at your bedside if they do not make breathing difficult.  · Drink enough water and fluids to keep your urine clear or pale yellow. This loosens secretions.  · Eat healthy foods. Eating smaller, more frequent meals and resting before meals may help you maintain your strength.  · Ask your caregiver about the use of vitamins and mineral supplements.  · Stay active. Exercise and physical activity will help maintain your ability to do things you want to do.  · Balance activity with periods of rest.  · Assume a position of comfort if you become short of breath.  · Learn and use relaxation techniques.  · Learn and use controlled breathing techniques as directed by your caregiver. Controlled breathing techniques include:  · Pursed lip breathing. This breathing technique starts with breathing in (inhaling) through your nose for 1 second. Next, purse your lips as if you were going to whistle. Then breathe out (exhale) through the pursed lips for 2 seconds.  · Diaphragmatic breathing. Start by putting one hand on your abdomen just above your waist. Inhale slowly through your nose. The hand on your abdomen should move out. Then exhale slowly through pursed lips. You should be able to feel the hand on your abdomen moving in as you exhale.  · Learn and use controlled coughing to clear mucus from your lungs. Controlled coughing is a series of short, progressive coughs. The steps of controlled coughing are:  1. Lean your head slightly forward.  2. Breathe in deeply using diaphragmatic breathing.  3. Try to hold your breath for 3 seconds.  4. Keep your mouth slightly open while coughing twice.  5. Spit any mucus out into a tissue.  6. Rest and repeat the steps once or twice as needed.  · Receive all  protective vaccines your caregiver suggests, especially pneumococcal and influenza vaccines.  · Learn to manage stress.  · Schedule and attend all follow-up appointments as directed by your caregiver. It is important to keep all your appointments.  · Participate in pulmonary rehabilitation as directed by your caregiver.  · Use home oxygen as suggested.  SEEK MEDICAL CARE IF:   · You are coughing up more mucus than usual.  · There is a change in the color or thickness of the mucus.  · Breathing is more labored than usual.  · Your breathing is faster than usual.  · Your skin color is more cyanotic than usual.  · You are running out of the medicine you take for your breathing.  · You are anxious, apprehensive, or restless.  · You have a fever.  SEEK IMMEDIATE MEDICAL CARE IF:   · You have a rapid heart rate.  ·   You have shortness of breath while you are resting.  · You have shortness of breath that prevents you from being able to talk.  · You have shortness of breath that prevents you from performing your usual physical activities.  · You have chest pain lasting longer than 5 minutes.  · You have a seizure.  · Your family or friends notice that you are agitated or confused.  MAKE SURE YOU:   · Understand these instructions.  · Will watch your condition.  · Will get help right away if you are not doing well or get worse.  Document Released: 08/10/2005 Document Revised: 07/25/2012 Document Reviewed: 12/31/2010  ExitCare® Patient Information ©2014 [employer].

## 2013-06-13 NOTE — Progress Notes (Signed)
Patient here for follow up of INR Right lower leg red and swollen with some  Tenderness to front of leg

## 2013-06-13 NOTE — Progress Notes (Signed)
Patient ID: [name], female   DOB: [birth year], 52 y.o.   MRN: [id number]  CC: follow up   HPI: Pt has been taking warfarin 2.5 mg po daily.  The dose was reduced last week according to patient.  She says that she has been taking it.  Her blood sugars have been running high.  She has been on steroids since being discharged from hospital in [place].  She is breathing much better now.  She is scheduled to see cardiologist and GI in the near future.   No Known Allergies Past Medical History  Diagnosis Date  . COPD (chronic obstructive pulmonary disease)   . Alcohol abuse   . Narcotic abuse   . Cocaine abuse   . Marijuana abuse   . Poorly controlled diabetes mellitus   . COPD (chronic obstructive pulmonary disease)   . Tobacco abuse   . Atrial fibrillation   . Heart failure   . Alcoholic cirrhosis   . Cardiomyopathy    Current Outpatient Prescriptions on File Prior to Visit  Medication Sig Dispense Refill  . carvedilol (COREG) 6.25 MG tablet Take 6.25 mg by mouth 2 (two) times daily with a meal.      . furosemide (LASIX) 40 MG tablet Take 40 mg by mouth.      . magnesium oxide (MAG-OX) 400 MG tablet Take 400 mg by mouth daily.      . metFORMIN (GLUCOPHAGE) 1000 MG tablet Take 1,000 mg by mouth 2 (two) times daily with a meal.      . predniSONE (DELTASONE) 10 MG tablet Take 10 mg by mouth daily.       No current facility-administered medications on file prior to visit.   Family History  Problem Relation Age of Onset  . Hypertension Mother   . Hypertension Father   . Diabetes Mother   . Cancer Mother     ovarian   History   Social History  . Marital Status: Divorced    Spouse Name: N/A    Number of Children: N/A  . Years of Education: N/A   Occupational History  . disabled    Social History Main Topics  . Smoking status: Current Every Day Smoker    Types: Cigarettes  . Smokeless tobacco: Not on file  . Alcohol Use: Yes  . Drug Use: Yes  . Sexually Active: Not  Currently   Other Topics Concern  . Not on file   Social History Narrative  . No narrative on file    Review of Systems  Constitutional: Negative for fever, chills, diaphoresis, activity change, appetite change and fatigue.  HENT: Negative for ear pain, nosebleeds, congestion, facial swelling, rhinorrhea, neck pain, neck stiffness and ear discharge.   Eyes: Negative for pain, discharge, redness, itching and visual disturbance.  Respiratory: Negative for cough, choking, chest tightness, shortness of breath, wheezing and stridor.   Cardiovascular: Negative for chest pain, palpitations and leg swelling.  Gastrointestinal: Negative for abdominal distention.  Genitourinary: Negative for dysuria, urgency, frequency, hematuria, flank pain, decreased urine volume, difficulty urinating and dyspareunia.  Musculoskeletal: Negative for back pain, joint swelling, arthralgias and gait problem.  Neurological: Negative for dizziness, tremors, seizures, syncope, facial asymmetry, speech difficulty, weakness, light-headedness, numbness and headaches.  Hematological: Negative for adenopathy. Does not bruise/bleed easily.  Psychiatric/Behavioral: Negative for hallucinations, behavioral problems, confusion, dysphoric mood, decreased concentration and agitation.    Objective:   Filed Vitals:   06/13/13 1509  BP: 129/84  Pulse: 88  Temp: 97  F (36.1 C)  Resp: 16    Physical Exam  Constitutional: Appears well-developed and well-nourished. No distress.  HENT: Normocephalic. External right and left ear normal. Oropharynx is clear and moist.  Eyes: Conjunctivae and EOM are normal. PERRLA, no scleral icterus.  Neck: Normal ROM. Neck supple. No JVD. No tracheal deviation. No thyromegaly.  CVS: RRR, S1/S2 +, no murmurs, no gallops, no carotid bruit.  Pulmonary: Effort and breath sounds normal, no stridor, rhonchi, wheezes, rales.  Abdominal: Soft. BS +,  no distension, tenderness, rebound or guarding.   Musculoskeletal: Normal range of motion. No edema and no tenderness.  Lymphadenopathy: No lymphadenopathy noted, cervical, inguinal. Neuro: Alert. Normal reflexes, muscle tone coordination. No cranial nerve deficit. Skin: Skin is warm and dry. No rash noted. Not diaphoretic. No erythema. No pallor.  Psychiatric: Normal mood and affect. Behavior, judgment, thought content normal.   No results found for this basename: WBC, HGB, HCT, MCV, PLT   Lab Results  Component Value Date   CREATININE 0.94 05/27/2013   BUN 30* 05/27/2013   NA 131* 05/27/2013   K 4.8 05/27/2013   CL 93* 05/27/2013   CO2 25 05/27/2013    No results found for this basename: HGBA1C   Lipid Panel  No results found for this basename: chol, trig, hdl, cholhdl, vldl, ldlcalc     Assessment and plan:   Patient Active Problem List   Diagnosis Date Noted  . Cellulitis 06/13/2013  . Long term (current) use of anticoagulants 05/20/2013  . COPD (chronic obstructive pulmonary disease)   . Alcohol abuse   . Narcotic abuse   . Marijuana abuse   . Cardiomyopathy   . Alcoholic cirrhosis   . Heart failure   . Atrial fibrillation   . Tobacco abuse   . Poorly controlled diabetes mellitus   Hyperglycemia Added glucotrol XL 10 mg po daily in AM   Increase warfarin to 5 mg po daily   Check INR next Wednesday  The patient was counseled on the dangers of tobacco use, and was advised to quit.  Reviewed strategies to maximize success, including removing cigarettes and smoking materials from environment.  RTC in 1 months  Check INR in 1 week  The patient was given clear instructions to go to ER or return to medical center if symptoms don't improve, worsen or new problems develop.  The patient verbalized understanding.  The patient was told to call to get any lab results if not heard anything in the next week.   INR recheck in 1 week  Gerlene Fee, MD, CDE, [place], [place]

## 2013-06-20 ENCOUNTER — Other Ambulatory Visit: Payer: Self-pay | Admitting: Obstetrics and Gynecology

## 2013-06-20 ENCOUNTER — Ambulatory Visit: Payer: Medicaid Other | Attending: Family Medicine

## 2013-06-20 VITALS — BP 98/61 | HR 76 | Temp 98.7°F | Resp 16

## 2013-06-20 DIAGNOSIS — I2699 Other pulmonary embolism without acute cor pulmonale: Secondary | ICD-10-CM

## 2013-06-20 DIAGNOSIS — Z1231 Encounter for screening mammogram for malignant neoplasm of breast: Secondary | ICD-10-CM

## 2013-06-20 NOTE — Patient Instructions (Addendum)
PT ORDERED TO TAKE 10MG  COUMADIN TODAY AND TOMORROW.  RESUME 5MG  DOSE STARTING Saturday UNTIL SHE RETURN Thursday.

## 2013-06-20 NOTE — Progress Notes (Unsigned)
PT HERE FOR INR DRAW. CURRENTLY TAKING WARFARIN 5MG .DENIES CONCERNS AT THIS TIME.

## 2013-06-27 ENCOUNTER — Ambulatory Visit: Payer: Self-pay | Attending: Internal Medicine

## 2013-06-27 ENCOUNTER — Ambulatory Visit: Payer: Medicaid Other | Attending: Family Medicine | Admitting: Internal Medicine

## 2013-06-27 ENCOUNTER — Encounter: Payer: Self-pay | Admitting: Internal Medicine

## 2013-06-27 VITALS — BP 117/76 | HR 74 | Temp 98.7°F | Resp 16

## 2013-06-27 DIAGNOSIS — L03119 Cellulitis of unspecified part of limb: Secondary | ICD-10-CM | POA: Insufficient documentation

## 2013-06-27 DIAGNOSIS — F1110 Opioid abuse, uncomplicated: Secondary | ICD-10-CM

## 2013-06-27 DIAGNOSIS — I2699 Other pulmonary embolism without acute cor pulmonale: Secondary | ICD-10-CM

## 2013-06-27 DIAGNOSIS — Z7901 Long term (current) use of anticoagulants: Secondary | ICD-10-CM

## 2013-06-27 DIAGNOSIS — L02419 Cutaneous abscess of limb, unspecified: Secondary | ICD-10-CM | POA: Insufficient documentation

## 2013-06-27 DIAGNOSIS — F1310 Sedative, hypnotic or anxiolytic abuse, uncomplicated: Secondary | ICD-10-CM

## 2013-06-27 DIAGNOSIS — L0291 Cutaneous abscess, unspecified: Secondary | ICD-10-CM

## 2013-06-27 DIAGNOSIS — J449 Chronic obstructive pulmonary disease, unspecified: Secondary | ICD-10-CM

## 2013-06-27 DIAGNOSIS — L0390 Cellulitis, unspecified: Secondary | ICD-10-CM

## 2013-06-27 LAB — POCT INR: INR: 2.3

## 2013-06-27 MED ORDER — TRAMADOL HCL 50 MG PO TABS: 50.0000 mg | ORAL_TABLET | Freq: Three times a day (TID) | ORAL | Status: DC | PRN

## 2013-06-27 MED ORDER — SULFAMETHOXAZOLE-TMP DS 800-160 MG PO TABS: 1.0000 | ORAL_TABLET | Freq: Two times a day (BID) | ORAL | Status: DC

## 2013-06-27 NOTE — Progress Notes (Signed)
PT INSTRUCTED TO CONTINUE TAKING COUMADIN 5MG  AND RETURN IN 2 WEEKS

## 2013-06-27 NOTE — Progress Notes (Signed)
Patient ID: [name], female   DOB: 1961/01/03, 52 y.o.   MRN: [id number]  CC: Followup  HPI: Patient was seen in the clinic today for followup of cellulitis. She still has significant redness of the right leg, and multiple scabs showing nonhealing ulcers. She sustained these wounds about a month ago when she was trying to shave her legs. She continue to smoke cigarette, abuses alcohol. She denies any chest. She has some tingling sensations in her legs as a result of neuropathy.   No Known Allergies Past Medical History  Diagnosis Date  . COPD (chronic obstructive pulmonary disease)   . Alcohol abuse   . Narcotic abuse   . Cocaine abuse   . Marijuana abuse   . Poorly controlled diabetes mellitus   . COPD (chronic obstructive pulmonary disease)   . Tobacco abuse   . Atrial fibrillation   . Heart failure   . Alcoholic cirrhosis   . Cardiomyopathy    Current Outpatient Prescriptions on File Prior to Visit  Medication Sig Dispense Refill  . carvedilol (COREG) 6.25 MG tablet Take 6.25 mg by mouth 2 (two) times daily with a meal.      . furosemide (LASIX) 40 MG tablet Take 40 mg by mouth.      [name] glipiZIDE (GLUCOTROL XL) 10 MG 24 hr tablet Take 1 tablet (10 mg total) by mouth daily with breakfast.  30 tablet  1  . magnesium oxide (MAG-OX) 400 MG tablet Take 400 mg by mouth daily.      . metFORMIN (GLUCOPHAGE) 1000 MG tablet Take 1,000 mg by mouth 2 (two) times daily with a meal.      . predniSONE (DELTASONE) 10 MG tablet Take 10 mg by mouth daily.      [name] warfarin (COUMADIN) 5 MG tablet Take 1 tablet (5 mg total) by mouth daily.  30 tablet  2   No current facility-administered medications on file prior to visit.   Family History  Problem Relation Age of Onset  . Hypertension Mother   . Hypertension Father   . Diabetes Mother   . Cancer Mother     ovarian   History   Social History  . Marital Status: Divorced    Spouse Name: N/A    Number of Children: N/A  . Years of  Education: N/A   Occupational History  . disabled    Social History Main Topics  . Smoking status: Current Every Day Smoker    Types: Cigarettes  . Smokeless tobacco: Not on file  . Alcohol Use: Yes  . Drug Use: Yes  . Sexual Activity: Not Currently   Other Topics Concern  . Not on file   Social History Narrative  . No narrative on file    Review of Systems: Constitutional: Negative for fever, chills, diaphoresis, activity change, appetite change and fatigue. HENT: Negative for ear pain, nosebleeds, congestion, facial swelling, rhinorrhea, neck pain, neck stiffness and ear discharge.  Eyes: Negative for pain, discharge, redness, itching and visual disturbance. Respiratory: Negative for cough, choking, chest tightness, shortness of breath, wheezing and stridor.  Cardiovascular: Negative for chest pain, palpitations and leg swelling. Gastrointestinal: Negative for abdominal distention. Genitourinary: Negative for dysuria, urgency, frequency, hematuria, flank pain, decreased urine volume, difficulty urinating and dyspareunia.  Neurological: Negative for dizziness, tremors, seizures, syncope, facial asymmetry, speech difficulty, weakness, light-headedness, numbness and headaches.  Hematological: Negative for adenopathy. Does not bruise/bleed easily. Psychiatric/Behavioral: Negative for hallucinations, behavioral problems, confusion, dysphoric mood, decreased concentration and  agitation.    Objective:   Filed Vitals:   06/27/13 1547  BP: 117/76  Pulse: 74  Temp: 98.7 F (37.1 C)  Resp: 16    Physical Exam: Constitutional: Patient appears well-developed and well-nourished. No distress. HENT: Normocephalic, atraumatic, External right and left ear normal. Oropharynx is clear and moist.  Eyes: Conjunctivae and EOM are normal. PERRLA, no scleral icterus. Neck: Normal ROM. Neck supple. No JVD. No tracheal deviation. No thyromegaly. CVS: RRR, S1/S2 +, no murmurs, no gallops, no  carotid bruit.  Pulmonary: Effort and breath sounds normal, no stridor, rhonchi, wheezes, rales.  Abdominal: Soft. BS +,  no distension, tenderness, rebound or guarding.  Musculoskeletal: Erythema and multiple scabs on right leg, nonhealing ulcers, poor pulsation .  Lymphadenopathy: No lymphadenopathy noted, cervical, inguinal or axillary Neuro: Alert. Normal reflexes, muscle tone coordination. No cranial nerve deficit. Skin: Skin is warm and dry. No rash noted. Not diaphoretic. No erythema. No pallor. Psychiatric: Normal mood and affect. Behavior, judgment, thought content normal.  No results found for this basename: WBC, HGB, HCT, MCV, PLT   Lab Results  Component Value Date   CREATININE 0.94 05/27/2013   BUN 30* 05/27/2013   NA 131* 05/27/2013   K 4.8 05/27/2013   CL 93* 05/27/2013   CO2 25 05/27/2013    No results found for this basename: HGBA1C   Lipid Panel  No results found for this basename: chol, trig, hdl, cholhdl, vldl, ldlcalc       Assessment and plan:   Patient Active Problem List   Diagnosis Date Noted  . Cellulitis 06/13/2013  . History of cocaine abuse 06/13/2013  . Long term (current) use of anticoagulants 05/20/2013  . COPD (chronic obstructive pulmonary disease)   . Alcohol abuse   . Narcotic abuse   . Marijuana abuse   . Cardiomyopathy   . Alcoholic cirrhosis   . Heart failure   . Atrial fibrillation   . Tobacco abuse   . Poorly controlled diabetes mellitus    Tramadol 50 mg tab by mouth twice a day when necessary pain Bactrim one tablet by mouth twice a day for 14 days  Patient extensively counseled about smoking cessation Patient also counseled about shaving her legs, she was instructed not to use razor because she cannot feel pain as a result of peripheral vascular disease and and poorly controlled diabetes with peripheral neuropathy and so we'll not know when she injure herself which may not heal as it is right now  INR is therapeutic today.  Continue present dose.      [name] was given clear instructions to go to ER or return to the clinic if symptoms don't improve, worsen or new problems develop.  [name] verbalized understanding.  [name] was told to call to get lab results if hasn't heard anything in the next week.    I STRONGLY RECOMMEND THAT YOU STOP SMOKING AND USING ALL TOBACCO / NICOTINE PRODUCTS.  If YOU ARE READY TO QUIT SMOKING PLEASE CALL  Smoking cessation specialist [phone]  Angelica Chessman, [place], [place]   06/27/2013, 4:16 PM

## 2013-06-27 NOTE — Progress Notes (Signed)
PT HERE FOR INR. TAKING COUMADIN 5MG  TAB. C/O RIGHT HEEL ABRASION WITH NEUROPATHY PAIN X 4 DYS AGO. TENDER TO THE TOUCH.

## 2013-07-02 ENCOUNTER — Ambulatory Visit (HOSPITAL_COMMUNITY)
Admission: RE | Admit: 2013-07-02 | Discharge: 2013-07-02 | Disposition: A | Discharge status: Home / Self Care | Payer: Medicaid Other | Source: Ambulatory Visit | Attending: Obstetrics and Gynecology | Admitting: Obstetrics and Gynecology

## 2013-07-02 ENCOUNTER — Ambulatory Visit (HOSPITAL_COMMUNITY)
Admission: RE | Admit: 2013-07-02 | Discharge: 2013-07-02 | Disposition: A | Discharge status: Home / Self Care | Payer: Self-pay | Source: Ambulatory Visit | Attending: Obstetrics and Gynecology | Admitting: Obstetrics and Gynecology

## 2013-07-02 ENCOUNTER — Encounter (HOSPITAL_COMMUNITY): Payer: Self-pay

## 2013-07-02 VITALS — BP 130/70 | Temp 98.1°F | Ht 64.0 in | Wt 168.8 lb

## 2013-07-02 DIAGNOSIS — Z1231 Encounter for screening mammogram for malignant neoplasm of breast: Secondary | ICD-10-CM

## 2013-07-02 DIAGNOSIS — Z01419 Encounter for gynecological examination (general) (routine) without abnormal findings: Secondary | ICD-10-CM

## 2013-07-02 NOTE — Progress Notes (Signed)
No complaints today.  Pap Smear:    Pap smear completed today. Per patient last Pap smear was 10 years ago in [facility] and normal. Per patient no history of an abnormal Pap smear. No Pap smear results in EPIC.  Physical exam: Breasts Breasts symmetrical. No skin abnormalities bilateral breasts. No nipple retraction bilateral breasts. No nipple discharge bilateral breasts. No lymphadenopathy. No lumps palpated bilateral breasts. No complaints of pain or tenderness on exam. Patient escorted to mammography for a screening mammogram.          Pelvic/Bimanual   Ext Genitalia No lesions, no swelling and no discharge observed on external genitalia.         Vagina Vagina pink and normal texture. No lesions or discharge observed in vagina.          Cervix Cervix is present. Cervix pink. Cervix friable. Lesion observed around 4 o'clock on the cervix. Small amount of white discharge observed on cervix.    Uterus Uterus is present and palpable. Uterus is positioned to the left and enlarged.  Referred patient to the [facility] for follow up for enlarged uterus and abnormal area observed on cervix. Appointment scheduled for Wednesday, August 21, 2013 at 1245       Adnexae Bilateral ovaries present and palpable. No tenderness on palpation.          Rectovaginal No rectal exam completed today since patient had no rectal complaints. No skin abnormalities observed on exam.

## 2013-07-02 NOTE — Patient Instructions (Signed)
[name] how to perform BSE and gave educational materials to take home. Let her know [facility] will cover Pap smears every 3 years unless has a history of abnormal Pap smears. Referred patient to the [facility] for follow up for enlarged uterus and abnormal area observed on cervix. Appointment scheduled for Wednesday, August 21, 2013 at 1245. Told patient [facility] would not cover the follow up but to fill out financial assistance paperwork. Patient given financial assistance paperwork. Let patient know will follow up with her within the next couple weeks with results by letter or phone. Smoking cessation discussed with patient. Patient aware of appointment and will be there. [name] verbalized understanding. Patient escorted to mammography for a screening mammogram.  Laurren Lepkowski, [place], RN 11:43 AM

## 2013-07-02 NOTE — Addendum Note (Signed)
Encounter addended by: Shirley Muscat, RN on: 07/02/2013 12:50 PM<BR>     Documentation filed: Visit Diagnoses, Charges VN

## 2013-07-05 ENCOUNTER — Telehealth (HOSPITAL_COMMUNITY): Payer: Self-pay | Admitting: *Deleted

## 2013-07-05 NOTE — Telephone Encounter (Signed)
Telephoned patient at home # and left message to return call to [facility] 

## 2013-07-05 NOTE — Telephone Encounter (Signed)
Patient returned call to [facility]. Advised patient of negative pap smear. Next pap due in 3 years. Also advised patient to keep appointment with [facility] on Oct 8. Patient voiced understanding.

## 2013-07-11 ENCOUNTER — Ambulatory Visit: Payer: Medicaid Other | Attending: Internal Medicine

## 2013-07-11 VITALS — BP 111/75 | HR 82 | Temp 98.2°F | Resp 16 | Ht 66.0 in | Wt 172.0 lb

## 2013-07-11 DIAGNOSIS — I2699 Other pulmonary embolism without acute cor pulmonale: Secondary | ICD-10-CM

## 2013-07-11 LAB — POCT INR: INR: 2.7

## 2013-07-11 NOTE — Progress Notes (Unsigned)
PT INR 2.7 INSTRUCTED TO CONTINUE 5MG  COUMADIN AND F/U IN 2 WEEKS. DENIES BLEEDING AT THIS TIME

## 2013-07-12 ENCOUNTER — Encounter: Payer: Self-pay | Admitting: Obstetrics & Gynecology

## 2013-07-17 ENCOUNTER — Ambulatory Visit: Payer: Self-pay | Admitting: Cardiovascular Disease

## 2013-07-17 ENCOUNTER — Telehealth: Payer: Self-pay | Admitting: Internal Medicine

## 2013-07-18 ENCOUNTER — Other Ambulatory Visit: Payer: Self-pay | Admitting: Internal Medicine

## 2013-07-18 ENCOUNTER — Telehealth: Payer: Self-pay | Admitting: Family Medicine

## 2013-07-18 DIAGNOSIS — L0390 Cellulitis, unspecified: Secondary | ICD-10-CM

## 2013-07-18 MED ORDER — FUROSEMIDE 40 MG PO TABS: 40.0000 mg | ORAL_TABLET | Freq: Every day | ORAL | Status: DC

## 2013-07-18 MED ORDER — TRAMADOL HCL 50 MG PO TABS: 50.0000 mg | ORAL_TABLET | Freq: Three times a day (TID) | ORAL | Status: DC | PRN

## 2013-07-18 NOTE — Telephone Encounter (Signed)
Pt called regarding a refill of her medication Tramadol, pt states that [facility] is still awaiting for doctor approval. Please contact pharmacy or pt.

## 2013-07-19 ENCOUNTER — Ambulatory Visit: Payer: Medicaid Other | Attending: Family Medicine

## 2013-07-19 VITALS — BP 162/84 | HR 81 | Temp 98.8°F | Resp 16 | Ht 64.0 in | Wt 177.0 lb

## 2013-07-19 DIAGNOSIS — I2699 Other pulmonary embolism without acute cor pulmonale: Secondary | ICD-10-CM

## 2013-07-19 NOTE — Progress Notes (Unsigned)
Pt here for labs only. 

## 2013-07-22 ENCOUNTER — Other Ambulatory Visit: Payer: Self-pay | Admitting: Obstetrics and Gynecology

## 2013-07-22 DIAGNOSIS — R928 Other abnormal and inconclusive findings on diagnostic imaging of breast: Secondary | ICD-10-CM

## 2013-07-26 ENCOUNTER — Ambulatory Visit: Payer: Medicaid Other | Attending: Internal Medicine

## 2013-07-26 ENCOUNTER — Encounter: Payer: Self-pay | Admitting: Internal Medicine

## 2013-07-26 ENCOUNTER — Ambulatory Visit: Payer: Medicaid Other | Attending: Internal Medicine | Admitting: Internal Medicine

## 2013-07-26 VITALS — BP 109/75 | HR 84 | Temp 98.3°F | Resp 16 | Wt 174.0 lb

## 2013-07-26 DIAGNOSIS — J449 Chronic obstructive pulmonary disease, unspecified: Secondary | ICD-10-CM | POA: Insufficient documentation

## 2013-07-26 DIAGNOSIS — E1165 Type 2 diabetes mellitus with hyperglycemia: Secondary | ICD-10-CM

## 2013-07-26 DIAGNOSIS — I429 Cardiomyopathy, unspecified: Secondary | ICD-10-CM

## 2013-07-26 DIAGNOSIS — I4891 Unspecified atrial fibrillation: Secondary | ICD-10-CM | POA: Insufficient documentation

## 2013-07-26 DIAGNOSIS — Z7901 Long term (current) use of anticoagulants: Secondary | ICD-10-CM

## 2013-07-26 DIAGNOSIS — E119 Type 2 diabetes mellitus without complications: Secondary | ICD-10-CM

## 2013-07-26 DIAGNOSIS — Z79899 Other long term (current) drug therapy: Secondary | ICD-10-CM | POA: Insufficient documentation

## 2013-07-26 DIAGNOSIS — J4489 Other specified chronic obstructive pulmonary disease: Secondary | ICD-10-CM | POA: Insufficient documentation

## 2013-07-26 DIAGNOSIS — L0291 Cutaneous abscess, unspecified: Secondary | ICD-10-CM

## 2013-07-26 DIAGNOSIS — L0390 Cellulitis, unspecified: Secondary | ICD-10-CM

## 2013-07-26 DIAGNOSIS — I428 Other cardiomyopathies: Secondary | ICD-10-CM

## 2013-07-26 LAB — BASIC METABOLIC PANEL
BUN: 19 mg/dL (ref 6–23)
Calcium: 9.3 mg/dL (ref 8.4–10.5)
Chloride: 99 mEq/L (ref 96–112)
Creat: 0.74 mg/dL (ref 0.50–1.10)

## 2013-07-26 LAB — CBC WITH DIFFERENTIAL/PLATELET
Basophils Relative: 0 % (ref 0–1)
Eosinophils Absolute: 0.2 10*3/uL (ref 0.0–0.7)
Lymphs Abs: 2.2 10*3/uL (ref 0.7–4.0)
MCH: 29.8 pg (ref 26.0–34.0)
MCHC: 34.4 g/dL (ref 30.0–36.0)
Neutrophils Relative %: 58 % (ref 43–77)
Platelets: 330 10*3/uL (ref 150–400)
RBC: 3.99 MIL/uL (ref 3.87–5.11)

## 2013-07-26 LAB — MAGNESIUM: Magnesium: 1.6 mg/dL (ref 1.5–2.5)

## 2013-07-26 LAB — POCT INR: INR: 1.4

## 2013-07-26 LAB — LIPID PANEL: Cholesterol: 258 mg/dL — ABNORMAL HIGH (ref 0–200)

## 2013-07-26 MED ORDER — WARFARIN SODIUM 5 MG PO TABS: 5.0000 mg | ORAL_TABLET | Freq: Every day | ORAL | Status: DC

## 2013-07-26 MED ORDER — FUROSEMIDE 40 MG PO TABS: 40.0000 mg | ORAL_TABLET | Freq: Every day | ORAL | Status: DC

## 2013-07-26 MED ORDER — NAPROXEN 500 MG PO TABS: 500.0000 mg | ORAL_TABLET | Freq: Two times a day (BID) | ORAL | Status: DC

## 2013-07-26 MED ORDER — MAGNESIUM OXIDE 400 MG PO TABS: 400.0000 mg | ORAL_TABLET | Freq: Every day | ORAL | Status: DC

## 2013-07-26 MED ORDER — METFORMIN HCL 1000 MG PO TABS: 1000.0000 mg | ORAL_TABLET | Freq: Two times a day (BID) | ORAL | Status: DC

## 2013-07-26 MED ORDER — GLIPIZIDE ER 10 MG PO TB24: 10.0000 mg | ORAL_TABLET | Freq: Every day | ORAL | Status: DC

## 2013-07-26 MED ORDER — CARVEDILOL 6.25 MG PO TABS: 6.2500 mg | ORAL_TABLET | Freq: Two times a day (BID) | ORAL | Status: DC

## 2013-07-26 NOTE — Patient Instructions (Addendum)
Atrial Fibrillation Your caregiver has diagnosed you with atrial fibrillation (AFib). The heart normally beats very regularly; AFib is a type of irregular heartbeat. The heart rate may be faster or slower than normal. This can prevent your heart from pumping as well as it should. AFib can be constant (chronic) or intermittent (paroxysmal). CAUSES  Atrial fibrillation may be caused by:  Heart disease, including heart attack, coronary artery disease, heart failure, diseases of the heart valves, and others.  Blood clot in the lungs (pulmonary embolism).  Pneumonia or other infections.  Chronic lung disease.  Thyroid disease.  Toxins. These include alcohol, some medications (such as decongestant medications or diet pills), and caffeine. In some people, no cause for AFib can be found. This is referred to as Lone Atrial Fibrillation. SYMPTOMS   Palpitations or a fluttering in your chest.  A vague sense of chest discomfort.  Shortness of breath.  Sudden onset of lightheadedness or weakness. Sometimes, the first sign of AFib can be a complication of the condition. This could be a stroke or heart failure. DIAGNOSIS  Your description of your condition may make your caregiver suspicious of atrial fibrillation. Your caregiver will examine your pulse to determine if fibrillation is present. An EKG (electrocardiogram) will confirm the diagnosis. Further testing may help determine what caused you to have atrial fibrillation. This may include chest x-ray, echocardiogram, blood tests, or CT scans. PREVENTION  If you have previously had atrial fibrillation, your caregiver may advise you to avoid substances known to cause the condition (such as stimulant medications, and possibly caffeine or alcohol). You may be advised to use medications to prevent recurrence. Proper treatment of any underlying condition is important to help prevent recurrence. PROGNOSIS  Atrial fibrillation does tend to become a  chronic condition over time. It can cause significant complications (see below). Atrial fibrillation is not usually immediately life-threatening, but it can shorten your life expectancy. This seems to be worse in women. If you have lone atrial fibrillation and are under 60 years old, the risk of complications is very low, and life expectancy is not shortened. RISKS AND COMPLICATIONS  Complications of atrial fibrillation can include stroke, chest pain, and heart failure. Your caregiver will recommend treatments for the atrial fibrillation, as well as for any underlying conditions, to help minimize risk of complications. TREATMENT  Treatment for AFib is divided into several categories:  Treatment of any underlying condition.  Converting you out of AFib into a regular (sinus) rhythm.  Controlling rapid heart rate.  Prevention of blood clots and stroke. Medications and procedures are available to convert your atrial fibrillation to sinus rhythm. However, recent studies have shown that this may not offer you any advantage, and cardiac experts are continuing research and debate on this topic. More important is controlling your rapid heartbeat. The rapid heartbeat causes more symptoms, and places strain on your heart. Your caregiver will advise you on the use of medications that can control your heart rate. Atrial fibrillation is a strong stroke risk. You can lessen this risk by taking blood thinning medications such as Coumadin (warfarin), or sometimes aspirin. These medications need close monitoring by your caregiver. Over-medication can cause bleeding. Too little medication may not protect against stroke. HOME CARE INSTRUCTIONS   If your caregiver prescribed medicine to make your heartbeat more normally, take as directed.  If blood thinners were prescribed by your caregiver, take EXACTLY as directed.  Perform blood tests EXACTLY as directed.  Quit smoking. Smoking increases your cardiac and   lung  (pulmonary) risks.  DO NOT drink alcohol.  DO NOT drink caffeinated drinks (e.g. coffee, soda, chocolate, and leaf teas). You may drink decaffeinated coffee, soda or tea.  If you are overweight, you should choose a reduced calorie diet to lose weight. Please see a registered dietitian if you need more information about healthy weight loss. DO NOT USE DIET PILLS as they may aggravate heart problems.  If you have other heart problems that are causing AFib, you may need to eat a low salt, fat, and cholesterol diet. Your caregiver will tell you if this is necessary.  Exercise every day to improve your physical fitness. Stay active unless advised otherwise.  If your caregiver has given you a follow-up appointment, it is very important to keep that appointment. Not keeping the appointment could result in heart failure or stroke. If there is any problem keeping the appointment, you must call back to this facility for assistance. SEEK MEDICAL CARE IF:  You notice a change in the rate, rhythm or strength of your heartbeat.  You develop an infection or any other change in your overall health status. SEEK IMMEDIATE MEDICAL CARE IF:   You develop chest pain, abdominal pain, sweating, weakness or feel sick to your stomach (nausea).  You develop shortness of breath.  You develop swollen feet and ankles.  You develop dizziness, numbness, or weakness of your face or limbs, or any change in vision or speech. MAKE SURE YOU:   Understand these instructions.  Will watch your condition.  Will get help right away if you are not doing well or get worse. Document Released: 10/31/2005 Document Revised: 01/23/2012 Document Reviewed: 06/09/2010 [facility] Patient Information 2014 [place]. Warfarin tablets What is this medicine? WARFARIN (WAR far in) is an anticoagulant. It is used to treat or prevent clots in the veins, arteries, lungs, or heart. This medicine may be used for other purposes; ask  your health care provider or pharmacist if you have questions. What should I tell my health care provider before I take this medicine? They need to know if you have any of these conditions: -alcoholism -anemia -bleeding disorders -cancer -diabetes -heart disease -high blood pressure -history of bleeding in the gastrointestinal tract -history of stroke or other brain injury or disease -kidney or liver disease -protein C deficiency -protein S deficiency -psychosis or dementia -recent injury, recent or planned surgery or procedure -an unusual or allergic reaction to warfarin, other medicines, foods, dyes, or preservatives -pregnant or trying to get pregnant -breast-feeding How should I use this medicine? Take this medicine by mouth with a glass of water. Follow the directions on the prescription label. You can take this medicine with or without food. Take your medicine at the same time each day. Do not take it more often than directed. Do not stop taking except on the advice of your doctor or health care professional. A special MedGuide will be given to you by the pharmacist with each prescription and refill. Be sure to read this information carefully each time. Talk to your pediatrician regarding the use of this medicine in children. Special care may be needed. Overdosage: If you think you have taken too much of this medicine contact a poison control center or emergency room at once. NOTE: This medicine is only for you. Do not share this medicine with others. What if I miss a dose? It is important not to miss a dose. If you miss a dose, call your healthcare provider. Take the dose as  soon as possible on the same day. If it is almost time for your next dose, take only that dose. Do not take double or extra doses to make up for a missed dose. What may interact with this medicine? Do not take this medicine with any of the following medications: -agents that prevent or dissolve blood  clots -aspirin or other salicylates -danshen -dextrothyroxine -mifepristone -St. John's Wort -red yeast rice This medicine may also interact with the following medications: -acetaminophen -agents that lower cholesterol -alcohol -allopurinol -amiodarone -antibiotics or medicines for treating bacterial, fungal or viral infections -azathioprine -barbiturate medicines for inducing sleep or treating seizures -certain medicines for diabetes -certain medicines for heart rhythm problems -certain medicines for high blood pressure -chloral hydrate -cisapride -disulfiram -female hormones, including contraceptive or birth control pills -general anesthetics -herbal or dietary products like cranberry, garlic, ginkgo, ginseng, green tea, or kava kava -influenza virus vaccine -female hormones -medicines for mental depression or psychosis -medicines for some types of cancer -medicines for stomach problems -methylphenidate -NSAIDs, medicines for pain and inflammation, like ibuprofen or naproxen -propoxyphene -quinidine, quinine -raloxifene -seizure or epilepsy medicine like carbamazepine, phenytoin, and valproic acid -steroids like cortisone and prednisone -tamoxifen -thyroid medicine -tramadol -vitamin c, vitamin e, and vitamin K -zafirlukast -zileuton This list may not describe all possible interactions. Give your health care provider a list of all the medicines, herbs, non-prescription drugs, or dietary supplements you use. Also tell them if you smoke, drink alcohol, or use illegal drugs. Some items may interact with your medicine. What should I watch for while using this medicine? Visit your doctor or health care professional for regular checks on your progress. You will need to have your blood checked regularly to make sure you are getting the right dose of this medicine. When you first start taking this medicine, these tests are done often. Once the correct dose is determined and you  take your medicine properly, these tests can be done less often. While you are taking this medicine, carry an identification card with your name, the name and dose of medicine(s) being used, and the name and phone number of your doctor or health care professional or person to contact in an emergency. You should discuss your diet with your doctor or health care professional. Do not make major changes in your diet. Many foods contain high amounts of vitamin K, which can interfere with the effect of this medicine. Your doctor or health care professional may want you to limit your intake of foods that contain vitamin K. Some foods that have moderate to high amounts of vitamin K are green leafy vegetables like beet greens, collard greens, endive, kale, mustard greens, spinach, turnip greens, watercress, and certain lettuces like green leaf or romaine. Some other foods that have high to moderate amounts of vitamin K are asparagus, black eye peas, broccoli, brussel sprouts, cabbage, cucumber with peel, okra, peas, parsley, and green onions. This medicine can cause birth defects or bleeding in an unborn child. Women of childbearing age should use effective birth control while taking this medicine. If a woman becomes pregnant while taking this medicine, she should discuss the potential risks and her options with her health care professional. Avoid sports and activities that might cause injury while you are using this medicine. Severe falls or injuries can cause unseen bleeding. Be careful when using sharp tools or knives. Consider using an Copy. Take special care brushing or flossing your teeth. Report any injuries, bruising, or red spots on the  skin to your doctor or health care professional. If you have an illness that causes vomiting, diarrhea, or fever for more than a few days, contact your doctor. Also check with your doctor if you are unable to eat for several days. These problems can change the effect  of this medicine. Even after you stop taking this medicine, it takes several days before your body recovers its normal ability to clot blood. Ask your doctor or health care professional how long you need to be careful. If you are going to have surgery or dental work, tell your doctor or health care professional that you have been taking this medicine. What side effects may I notice from receiving this medicine? Side effects that you should report to your doctor or health care professional as soon as possible: -back or stomach pain -chest pain or fast or irregular heartbeat -difficulty breathing or talking, wheezing -dizziness -fever or chills -headaches -heavy menstrual bleeding or vaginal bleeding -nausea, vomiting -painful, blue, or purple toes -painful, prolonged erection -signs and symptoms of bleeding such as bloody or black, tarry stools, red or dark-brown urine, spitting up blood or brown material that looks like coffee grounds, red spots on the skin, unusual bruising or bleeding from the eye, gums, or nose -skin rash, itching or skin damage -unusual swelling or sudden weight gain -unusually weak or tired -yellowing of skin or eyes Side effects that usually do not require medical attention (report to your doctor or health care professional if they continue or are bothersome): -diarrhea -unusual hair loss This list may not describe all possible side effects. Call your doctor for medical advice about side effects. You may report side effects to FDA at 1-800-FDA-1088. Where should I keep my medicine? Keep out of the reach of children. Store at room temperature between 15 and 30 degrees C (59 and 86 degrees F). Protect from light. Throw away any unused medicine after the expiration date. NOTE: This sheet is a summary. It may not cover all possible information. If you have questions about this medicine, talk to your doctor, pharmacist, or health care provider.  2012, [employer]. (03/02/2011 10:58:04 AM)

## 2013-07-26 NOTE — Progress Notes (Signed)
Patient ID: [name], female   DOB: 16-Sep-1961, 52 y.o.   MRN: [id number]  CC: Followup  HPI: 52 year old female with past medical history of COPD, drug abuse remote history, poorly controlled diabetes, cardiomyopathy and atrial fibrillation on Coumadin who presented to clinic for followup. Patient has no current complaints. She reported not taking amiodarone due to severe nausea and vomiting which was prescribed to her by [facility] physicians. She has no cardiologist in the town who follows her. Patient has no chest pain or shortness of breath. No fever or chills. No cough. No orthopnea.  No Known Allergies Past Medical History  Diagnosis Date  . COPD (chronic obstructive pulmonary disease)   . Alcohol abuse   . Narcotic abuse   . Cocaine abuse   . Marijuana abuse   . Poorly controlled diabetes mellitus   . COPD (chronic obstructive pulmonary disease)   . Tobacco abuse   . Atrial fibrillation   . Heart failure   . Alcoholic cirrhosis   . Cardiomyopathy    Current Outpatient Prescriptions on File Prior to Visit  Medication Sig Dispense Refill  . carvedilol (COREG) 6.25 MG tablet Take 6.25 mg by mouth 2 (two) times daily with a meal.      . furosemide (LASIX) 40 MG tablet Take 1 tablet (40 mg total) by mouth daily.  30 tablet  3  . glipiZIDE (GLUCOTROL XL) 10 MG 24 hr tablet Take 1 tablet (10 mg total) by mouth daily with breakfast.  30 tablet  1  . magnesium oxide (MAG-OX) 400 MG tablet Take 400 mg by mouth daily.      . metFORMIN (GLUCOPHAGE) 1000 MG tablet Take 1,000 mg by mouth 2 (two) times daily with a meal.      . predniSONE (DELTASONE) 10 MG tablet Take 10 mg by mouth daily.      [name] sulfamethoxazole-trimethoprim (BACTRIM DS) 800-160 MG per tablet Take 1 tablet by mouth 2 (two) times daily.  28 tablet  0  . traMADol (ULTRAM) 50 MG tablet Take 1 tablet (50 mg total) by mouth every 8 (eight) hours as needed for pain.  30 tablet  0  . warfarin (COUMADIN) 5 MG tablet Take 1  tablet (5 mg total) by mouth daily.  30 tablet  2   No current facility-administered medications on file prior to visit.   Family History  Problem Relation Age of Onset  . Hypertension Mother   . Diabetes Mother   . Cancer Mother     breast, ovarian, colon  . Hypertension Father    History   Social History  . Marital Status: Divorced    Spouse Name: N/A    Number of Children: N/A  . Years of Education: N/A   Occupational History  . disabled    Social History Main Topics  . Smoking status: Current Every Day Smoker    Types: Cigarettes  . Smokeless tobacco: Not on file  . Alcohol Use: Yes     Comment: has not used in 6 or 7 months  . Drug Use: Yes    Special: "Crack" cocaine, Marijuana     Comment: patient states has not used in 3 months  . Sexual Activity: Not Currently   Other Topics Concern  . Not on file   Social History Narrative  . No narrative on file    Review of Systems  Constitutional: Negative for fever, chills, diaphoresis, activity change, appetite change and fatigue.  HENT: Negative for ear pain, nosebleeds,  congestion, facial swelling, rhinorrhea, neck pain, neck stiffness and ear discharge.   Eyes: Negative for pain, discharge, redness, itching and visual disturbance.  Respiratory: Negative for cough, choking, chest tightness, shortness of breath, wheezing and stridor.   Cardiovascular: Negative for chest pain, palpitations and leg swelling.  Gastrointestinal: Negative for abdominal distention.  Genitourinary: Negative for dysuria, urgency, frequency, hematuria, flank pain, decreased urine volume, difficulty urinating and dyspareunia.  Musculoskeletal: Negative for back pain, joint swelling, arthralgias and gait problem.  Neurological: Negative for dizziness, tremors, seizures, syncope, facial asymmetry, speech difficulty, weakness, light-headedness, numbness and headaches.  Hematological: Negative for adenopathy. Does not bruise/bleed easily.   Psychiatric/Behavioral: Negative for hallucinations, behavioral problems, confusion, dysphoric mood, decreased concentration and agitation.    Objective:   Filed Vitals:   07/26/13 1203  BP: 109/75  Pulse: 84  Temp: 98.3 F (36.8 C)  Resp: 16    Physical Exam  Constitutional: Appears well-developed and well-nourished. No distress.  HENT: Normocephalic. External right and left ear normal. Oropharynx is clear and moist.  Eyes: Conjunctivae and EOM are normal. PERRLA, no scleral icterus.  Neck: Normal ROM. Neck supple. No JVD. No tracheal deviation. No thyromegaly.  CVS: irregular rhythm, rate controleld, S1/S2 appreciated  Pulmonary: Effort and breath sounds normal, no stridor, rhonchi, wheezes, rales.  Abdominal: Soft. BS +,  no distension, tenderness, rebound or guarding.  Musculoskeletal: Normal range of motion. No edema and no tenderness.  Lymphadenopathy: No lymphadenopathy noted, cervical, inguinal. Neuro: Alert. Normal reflexes, muscle tone coordination. No cranial nerve deficit. Skin: Skin is warm and dry. No rash noted. Not diaphoretic. No erythema. No pallor.  Psychiatric: Normal mood and affect. Behavior, judgment, thought content normal.   No results found for this basename: WBC, HGB, HCT, MCV, PLT   Lab Results  Component Value Date   CREATININE 0.94 05/27/2013   BUN 30* 05/27/2013   NA 131* 05/27/2013   K 4.8 05/27/2013   CL 93* 05/27/2013   CO2 25 05/27/2013    No results found for this basename: HGBA1C   Lipid Panel  No results found for this basename: chol, trig, hdl, cholhdl, vldl, ldlcalc       Assessment and plan:   Patient Active Problem List   Diagnosis Date Noted  . Cardiomyopathy     Priority: Medium - pt reported not taking amiodarone so we discontinued this medication - she has no cardiologist in town so referral provided  . Atrial fibrillation     Priority: Medium - INR 1.4 today. Patient advised to take Coumadin 7.5 mg tonight, 5 mg the  following day and Sunday to take 7.5 mg. Patient will come back Monday to recheck INR  - continue coreg 6.25 mg BID  . Poorly controlled diabetes mellitus     Priority: Medium - check A1c today - Continue metformin and glipizide   .  Preventive health care  - Check lipid panel, A1c, TSH  - Check magnesium level and BMP , CBC  06/13/2013

## 2013-07-26 NOTE — Progress Notes (Signed)
Pt is here for a f/u and wanting to review meds Alert w/no signs of acute distress

## 2013-07-29 ENCOUNTER — Ambulatory Visit: Payer: Medicaid Other | Attending: Internal Medicine

## 2013-07-29 DIAGNOSIS — Z7901 Long term (current) use of anticoagulants: Secondary | ICD-10-CM | POA: Insufficient documentation

## 2013-07-29 DIAGNOSIS — Z09 Encounter for follow-up examination after completed treatment for conditions other than malignant neoplasm: Secondary | ICD-10-CM | POA: Insufficient documentation

## 2013-07-29 LAB — POCT INR: INR: 1.7

## 2013-07-29 NOTE — Patient Instructions (Signed)
PT TO TAKE 7.5 MG COUMADIN M- THURS. RETURN Friday FOR REPEAT

## 2013-07-31 ENCOUNTER — Ambulatory Visit: Payer: Medicaid Other | Attending: Cardiology | Admitting: Cardiology

## 2013-07-31 ENCOUNTER — Encounter: Payer: Self-pay | Admitting: Cardiology

## 2013-07-31 VITALS — BP 116/83 | HR 76 | Temp 98.3°F | Resp 16

## 2013-07-31 DIAGNOSIS — Z09 Encounter for follow-up examination after completed treatment for conditions other than malignant neoplasm: Secondary | ICD-10-CM | POA: Insufficient documentation

## 2013-07-31 DIAGNOSIS — S8990XA Unspecified injury of unspecified lower leg, initial encounter: Secondary | ICD-10-CM | POA: Insufficient documentation

## 2013-07-31 DIAGNOSIS — J4489 Other specified chronic obstructive pulmonary disease: Secondary | ICD-10-CM

## 2013-07-31 DIAGNOSIS — I739 Peripheral vascular disease, unspecified: Secondary | ICD-10-CM

## 2013-07-31 DIAGNOSIS — Z7901 Long term (current) use of anticoagulants: Secondary | ICD-10-CM

## 2013-07-31 DIAGNOSIS — Z720 Tobacco use: Secondary | ICD-10-CM

## 2013-07-31 DIAGNOSIS — Z79899 Other long term (current) drug therapy: Secondary | ICD-10-CM | POA: Insufficient documentation

## 2013-07-31 DIAGNOSIS — I4891 Unspecified atrial fibrillation: Secondary | ICD-10-CM

## 2013-07-31 DIAGNOSIS — I70209 Unspecified atherosclerosis of native arteries of extremities, unspecified extremity: Secondary | ICD-10-CM

## 2013-07-31 DIAGNOSIS — X58XXXA Exposure to other specified factors, initial encounter: Secondary | ICD-10-CM | POA: Insufficient documentation

## 2013-07-31 DIAGNOSIS — F17200 Nicotine dependence, unspecified, uncomplicated: Secondary | ICD-10-CM

## 2013-07-31 DIAGNOSIS — L98499 Non-pressure chronic ulcer of skin of other sites with unspecified severity: Secondary | ICD-10-CM

## 2013-07-31 DIAGNOSIS — E039 Hypothyroidism, unspecified: Secondary | ICD-10-CM | POA: Insufficient documentation

## 2013-07-31 DIAGNOSIS — J449 Chronic obstructive pulmonary disease, unspecified: Secondary | ICD-10-CM

## 2013-07-31 DIAGNOSIS — I428 Other cardiomyopathies: Secondary | ICD-10-CM

## 2013-07-31 DIAGNOSIS — I429 Cardiomyopathy, unspecified: Secondary | ICD-10-CM

## 2013-07-31 MED ORDER — LEVOTHYROXINE SODIUM 50 MCG PO TABS: 50.0000 ug | ORAL_TABLET | Freq: Every day | ORAL | Status: DC

## 2013-07-31 MED ORDER — ASPIRIN EC 81 MG PO TBEC: 81.0000 mg | DELAYED_RELEASE_TABLET | Freq: Every day | ORAL | Status: DC

## 2013-07-31 NOTE — Assessment & Plan Note (Signed)
She most likely has coronary disease but is asymptomatic. Continue secondary preventative therapy. Would not perform echocardiogram at this time because it will not change our treatment strategy. We'll make sure she is taking 81 mg of aspirin a day.

## 2013-07-31 NOTE — Assessment & Plan Note (Signed)
Will began replacement therapy with levothyroxine at 0.5 mg per day. Repeat TSH in 6 weeks.

## 2013-07-31 NOTE — Assessment & Plan Note (Signed)
Foot care emphasized to the patient. She has been advised of this before. No indication of infection at this point. Continue to follow. Until she stops smoking, there is very little we can do. I made this clear to the patient.

## 2013-07-31 NOTE — Assessment & Plan Note (Signed)
INR check arranged for Friday.

## 2013-07-31 NOTE — Assessment & Plan Note (Signed)
Rate controlled and asymptomatic. INR subtherapeutic. We'll repeat INR today. If she remains subtherapeutic  Noncompliant is her record indicates, we may want to stop this drug altogether for increased risk and 4 no benefit if subtherapeutic.

## 2013-07-31 NOTE — Progress Notes (Signed)
HPI [name] comes in today for followup. She continues to take poor care of her self and has not stopped smoking. She has 3 nonhealing areas on her right heel but they do not appear to be infected. He is just gotten over cellulitis.  Her blood work was remarkable for an elevated TSH. Her LDLs also greater than 100. I do not see a history of hypothyroidism. She also has had some therapeutic on arch the last several visits. Her Coumadin dose was increased last visit. She says she's never had a pulmonary embolus.  Past Medical History  Diagnosis Date  . COPD (chronic obstructive pulmonary disease)   . Alcohol abuse   . Narcotic abuse   . Cocaine abuse   . Marijuana abuse   . Poorly controlled diabetes mellitus   . COPD (chronic obstructive pulmonary disease)   . Tobacco abuse   . Atrial fibrillation   . Heart failure   . Alcoholic cirrhosis   . Cardiomyopathy     Current Outpatient Prescriptions  Medication Sig Dispense Refill  . carvedilol (COREG) 6.25 MG tablet Take 1 tablet (6.25 mg total) by mouth 2 (two) times daily with a meal.  60 tablet  3  . glipiZIDE (GLUCOTROL XL) 10 MG 24 hr tablet Take 1 tablet (10 mg total) by mouth daily with breakfast.  30 tablet  3  . lisinopril (PRINIVIL,ZESTRIL) 10 MG tablet Take 10 mg by mouth daily.      . magnesium oxide (MAG-OX) 400 MG tablet Take 1 tablet (400 mg total) by mouth daily.  30 tablet  1  . metFORMIN (GLUCOPHAGE) 1000 MG tablet Take 1 tablet (1,000 mg total) by mouth 2 (two) times daily with a meal.  60 tablet  3  . naproxen (NAPROSYN) 500 MG tablet Take 1 tablet (500 mg total) by mouth 2 (two) times daily with a meal.  45 tablet  1  . warfarin (COUMADIN) 5 MG tablet Take 1 tablet (5 mg total) by mouth daily.  30 tablet  3  . furosemide (LASIX) 40 MG tablet Take 1 tablet (40 mg total) by mouth daily.  30 tablet  3   No current facility-administered medications for this visit.    No Known Allergies  Family History  Problem  Relation Age of Onset  . Hypertension Mother   . Diabetes Mother   . Cancer Mother     breast, ovarian, colon  . Hypertension Father     History   Social History  . Marital Status: Divorced    Spouse Name: N/A    Number of Children: N/A  . Years of Education: N/A   Occupational History  . disabled    Social History Main Topics  . Smoking status: Current Every Day Smoker    Types: Cigarettes  . Smokeless tobacco: Not on file  . Alcohol Use: Yes     Comment: has not used in 6 or 7 months  . Drug Use: Yes    Special: "Crack" cocaine, Marijuana     Comment: patient states has not used in 3 months  . Sexual Activity: Not Currently   Other Topics Concern  . Not on file   Social History Narrative  . No narrative on file    ROS ALL NEGATIVE EXCEPT THOSE NOTED IN HPI  PE  General Appearance: well developed, well nourished in no acute distress, chronically ill and looks much older than stated age, disheveled HEENT: symmetrical face, PERRLA, g poor dentition Neck: no JVD,  thyromegaly, or adenopathy, trachea midline Chest: symmetric without deformity Cardiac: PMI non-displaced, RRR, soft S1 and S2, no gallop or murmur Lung: Decreased breath sounds throughout without wheezing Vascular: Pulses not palpable in her feet, dependent rubor, Abdominal: nondistended, nontender, good bowel sounds, no HSM, no bruits Extremities: no cyanosis, dependent rubor, 3 areas on her right heel that showed no signs of infection. no sign of DVT, no varicosities  Skin: normal color, no rashes Neuro: alert and oriented x 3, non-focal Pysch: normal affect  EKG  BMET    Component Value Date/Time   NA 137 07/26/2013 1242   K 4.5 07/26/2013 1242   CL 99 07/26/2013 1242   CO2 30 07/26/2013 1242   GLUCOSE 105* 07/26/2013 1242   BUN 19 07/26/2013 1242   CREATININE 0.74 07/26/2013 1242   CALCIUM 9.3 07/26/2013 1242    Lipid Panel     Component Value Date/Time   CHOL 258* 07/26/2013 1242   TRIG  232* 07/26/2013 1242   HDL 58 07/26/2013 1242   CHOLHDL 4.4 07/26/2013 1242   VLDL 46* 07/26/2013 1242   LDLCALC 154* 07/26/2013 1242    CBC    Component Value Date/Time   WBC 7.7 07/26/2013 1242   RBC 3.99 07/26/2013 1242   HGB 11.9* 07/26/2013 1242   HCT 34.6* 07/26/2013 1242   PLT 330 07/26/2013 1242   MCV 86.7 07/26/2013 1242   MCH 29.8 07/26/2013 1242   MCHC 34.4 07/26/2013 1242   RDW 19.2* 07/26/2013 1242   LYMPHSABS 2.2 07/26/2013 1242   MONOABS 0.8 07/26/2013 1242   EOSABS 0.2 07/26/2013 1242   BASOSABS 0.0 07/26/2013 1242

## 2013-07-31 NOTE — Progress Notes (Signed)
PT HERE F/U CARDIOMYOPATHY DENIES CP OR SOB TAKING COUMADIN THERAPY

## 2013-07-31 NOTE — Addendum Note (Signed)
Addended by: Candie Chroman D on: 07/31/2013 01:04 PM   Modules accepted: Orders

## 2013-08-01 ENCOUNTER — Other Ambulatory Visit: Payer: Self-pay

## 2013-08-02 ENCOUNTER — Ambulatory Visit: Payer: Medicaid Other | Attending: Internal Medicine

## 2013-08-02 VITALS — BP 148/83 | HR 85 | Temp 98.6°F | Resp 16 | Ht 64.0 in | Wt 178.0 lb

## 2013-08-02 DIAGNOSIS — Z7901 Long term (current) use of anticoagulants: Secondary | ICD-10-CM

## 2013-08-02 NOTE — Progress Notes (Unsigned)
Pt is here for an INR check

## 2013-08-06 ENCOUNTER — Other Ambulatory Visit (HOSPITAL_COMMUNITY): Payer: Self-pay | Admitting: *Deleted

## 2013-08-06 ENCOUNTER — Encounter (HOSPITAL_COMMUNITY): Payer: Self-pay | Admitting: *Deleted

## 2013-08-06 ENCOUNTER — Other Ambulatory Visit (HOSPITAL_COMMUNITY): Payer: Self-pay | Admitting: Podiatry

## 2013-08-06 DIAGNOSIS — R0989 Other specified symptoms and signs involving the circulatory and respiratory systems: Secondary | ICD-10-CM

## 2013-08-09 ENCOUNTER — Telehealth: Payer: Self-pay | Admitting: Emergency Medicine

## 2013-08-09 NOTE — Telephone Encounter (Signed)
Spoke with Health visitor for new pt services. Pt [name] will receive all medication from their courier services/med assist program. Judson Roch will fax over release form of pt meds.

## 2013-08-10 ENCOUNTER — Emergency Department (HOSPITAL_COMMUNITY)
Admission: EM | Admit: 2013-08-10 | Discharge: 2013-08-10 | Disposition: A | Discharge status: Home / Self Care | Payer: Medicaid Other | Source: Home / Self Care

## 2013-08-10 ENCOUNTER — Encounter (HOSPITAL_COMMUNITY): Payer: Self-pay | Admitting: Emergency Medicine

## 2013-08-10 DIAGNOSIS — T148XXA Other injury of unspecified body region, initial encounter: Secondary | ICD-10-CM

## 2013-08-10 DIAGNOSIS — L089 Local infection of the skin and subcutaneous tissue, unspecified: Secondary | ICD-10-CM

## 2013-08-10 DIAGNOSIS — IMO0002 Reserved for concepts with insufficient information to code with codable children: Secondary | ICD-10-CM

## 2013-08-10 DIAGNOSIS — M792 Neuralgia and neuritis, unspecified: Secondary | ICD-10-CM

## 2013-08-10 MED ORDER — GABAPENTIN 100 MG PO CAPS: 100.0000 mg | ORAL_CAPSULE | Freq: Three times a day (TID) | ORAL | Status: DC

## 2013-08-10 MED ORDER — TRAMADOL HCL 50 MG PO TABS: 50.0000 mg | ORAL_TABLET | Freq: Four times a day (QID) | ORAL | Status: DC | PRN

## 2013-08-10 MED ORDER — SILVER SULFADIAZINE 1 % EX CREA: TOPICAL_CREAM | Freq: Every day | CUTANEOUS | Status: DC

## 2013-08-10 NOTE — ED Provider Notes (Signed)
Medical screening examination/treatment/procedure(s) were performed by a resident physician and as supervising physician I was immediately available for consultation/collaboration.  Philipp Deputy, M.D.  Harden Mo, MD 08/10/13 [phone]

## 2013-08-10 NOTE — ED Notes (Signed)
Pt c/o bilateral heel pain onset 3 months Pain radiates to right side of hip Pain is constant and increases w/acitivty  Sxs also include: swelling, redness Seen at [facility] and was referred to a foot specialist who then referred her to the cardiovascular center She is alert w/no signs of acute distress.

## 2013-08-10 NOTE — ED Provider Notes (Signed)
CSN: [id number]     Arrival date & time 08/10/13  1205 History   None    Chief Complaint  Patient presents with  . Foot Pain   (Consider location/radiation/quality/duration/timing/severity/associated sxs/prior Treatment) HPI  Foot pain: started several years ago. Already seen for this 7 days ago by podiatrist who started on Keflex and naprosyn. Naprosyn not working. Stabbing pain. Going for vascular studies next week due to PVD. No purulent discharge. Applying tripple ABX. Also w/ medial R heal lesion for several months that is scabbed over and healing very slowly. Burning sensation also in hands and other toes.    Past Medical History  Diagnosis Date  . COPD (chronic obstructive pulmonary disease)   . Alcohol abuse   . Narcotic abuse   . Cocaine abuse   . Marijuana abuse   . Poorly controlled diabetes mellitus   . COPD (chronic obstructive pulmonary disease)   . Tobacco abuse   . Atrial fibrillation   . Heart failure   . Alcoholic cirrhosis   . Cardiomyopathy    History reviewed. No pertinent past surgical history. Family History  Problem Relation Age of Onset  . Hypertension Mother   . Diabetes Mother   . Cancer Mother     breast, ovarian, colon  . Hypertension Father    History  Substance Use Topics  . Smoking status: Current Every Day Smoker -- 1.00 packs/day    Types: Cigarettes  . Smokeless tobacco: Not on file  . Alcohol Use: Yes     Comment: has not used in 6 or 7 months   OB History   Grav Para Term Preterm Abortions TAB SAB Ect Mult Living   1 1 1       1      Review of Systems  Constitutional: Negative for fever, chills and activity change.  Respiratory: Negative for shortness of breath and wheezing.   Cardiovascular: Negative for chest pain, palpitations and leg swelling.  Gastrointestinal: Negative for abdominal pain and abdominal distention.  Musculoskeletal: Negative for joint swelling.  Skin: Positive for wound.    Allergies  Review of  patient's allergies indicates no known allergies.  Home Medications   Current Outpatient Rx  Name  Route  Sig  Dispense  Refill  . carvedilol (COREG) 6.25 MG tablet   Oral   Take 1 tablet (6.25 mg total) by mouth 2 (two) times daily with a meal.   60 tablet   3   . furosemide (LASIX) 40 MG tablet   Oral   Take 1 tablet (40 mg total) by mouth daily.   30 tablet   3   . glipiZIDE (GLUCOTROL XL) 10 MG 24 hr tablet   Oral   Take 1 tablet (10 mg total) by mouth daily with breakfast.   30 tablet   3   . levothyroxine (SYNTHROID, LEVOTHROID) 50 MCG tablet   Oral   Take 1 tablet (50 mcg total) by mouth daily.   120 tablet   3   . lisinopril (PRINIVIL,ZESTRIL) 10 MG tablet   Oral   Take 10 mg by mouth daily.         . magnesium oxide (MAG-OX) 400 MG tablet   Oral   Take 1 tablet (400 mg total) by mouth daily.   30 tablet   1   . metFORMIN (GLUCOPHAGE) 1000 MG tablet   Oral   Take 1 tablet (1,000 mg total) by mouth 2 (two) times daily with a meal.   60  tablet   3   . naproxen (NAPROSYN) 500 MG tablet   Oral   Take 1 tablet (500 mg total) by mouth 2 (two) times daily with a meal.   45 tablet   1   . aspirin EC 81 MG tablet   Oral   Take 1 tablet (81 mg total) by mouth daily.   120 tablet   3   . gabapentin (NEURONTIN) 100 MG capsule   Oral   Take 1 capsule (100 mg total) by mouth 3 (three) times daily.   90 capsule   0   . silver sulfADIAZINE (SILVADENE) 1 % cream   Topical   Apply topically daily. Apply to foot   50 g   0   . traMADol (ULTRAM) 50 MG tablet   Oral   Take 1 tablet (50 mg total) by mouth every 6 (six) hours as needed for pain.   30 tablet   0   . warfarin (COUMADIN) 5 MG tablet   Oral   Take 1 tablet (5 mg total) by mouth daily.   30 tablet   3    BP 162/89  Pulse 85  Temp(Src) 98.1 F (36.7 C) (Oral)  Resp 18  SpO2 95%  LMP 11/27/2012 Physical Exam  Constitutional: She is oriented to person, place, and time. She appears  well-developed and well-nourished. No distress.  HENT:  Head: Normocephalic.  Eyes: EOM are normal. Pupils are equal, round, and reactive to light.  Neck: Normal range of motion. No tracheal deviation present.  Cardiovascular: Normal rate and normal heart sounds.   No murmur heard. 1+ distal pulses   Pulmonary/Chest: Effort normal and breath sounds normal. No respiratory distress. She has no wheezes. She has no rales. She exhibits no tenderness.  Abdominal: Soft. She exhibits no distension.  Musculoskeletal: Normal range of motion.  Trace LE edema  Neurological: She is alert and oriented to person, place, and time. No cranial nerve deficit.  Skin: Skin is warm. She is not diaphoretic.  Skin tears along the dorsum of the 2nd and 3rd toes w/o purulend discharge in various stages of healing w/ some serosanguanous drainage and surounding erythema/induration.   Psychiatric: She has a normal mood and affect. Her behavior is normal. Judgment and thought content normal.    ED Course  Procedures (including critical care time) Labs Review Labs Reviewed - No data to display Imaging Review No results found.  MDM   1. Neuropathic pain   2. Foot infection   3. Skin tear    52yo F w/ complex pmh presenting for foot pain that is multifactorial. Some component of DM neuropathy given stocking glove distributiona nd burning nature. Also related to PVD. Pt needs to keep appt for further vascular workup. No claudication. Infected foot appears to be healing  - add silvadene cream given open wounds and chronic heal wounds - Tramadol for pain from foot trauma - adding gabapentin (100mg  TID) for likely neuropathic type pain - all questions answered and precaution given  Reviewed previous providers notes and recent labs.     Linna Darner, MD Family Medicine PGY-3 08/10/2013, 2:01 PM    Waldemar Dickens, MD 08/10/13 [phone]

## 2013-08-13 ENCOUNTER — Ambulatory Visit (HOSPITAL_COMMUNITY)
Admission: RE | Admit: 2013-08-13 | Discharge: 2013-08-13 | Disposition: A | Discharge status: Home / Self Care | Payer: Medicaid Other | Source: Ambulatory Visit | Attending: Cardiovascular Disease | Admitting: Cardiovascular Disease

## 2013-08-13 DIAGNOSIS — R0989 Other specified symptoms and signs involving the circulatory and respiratory systems: Secondary | ICD-10-CM | POA: Insufficient documentation

## 2013-08-13 NOTE — Progress Notes (Signed)
Lower Extremity Arterial Duplex Completed. °Brianna L Mazza,RVT °

## 2013-08-14 ENCOUNTER — Ambulatory Visit
Admission: RE | Admit: 2013-08-14 | Discharge: 2013-08-14 | Disposition: A | Discharge status: Home / Self Care | Payer: Medicaid Other | Source: Ambulatory Visit | Attending: Obstetrics and Gynecology | Admitting: Obstetrics and Gynecology

## 2013-08-14 DIAGNOSIS — R928 Other abnormal and inconclusive findings on diagnostic imaging of breast: Secondary | ICD-10-CM

## 2013-08-15 ENCOUNTER — Telehealth: Payer: Self-pay | Admitting: Internal Medicine

## 2013-08-15 NOTE — Telephone Encounter (Signed)
[facility] says they faxed over a request for pt's medication regimen in order to enroll pt in their program.  They have not received reply and were f/u to see if they needed to resend.  Please f/u with [name], [phone]

## 2013-08-16 NOTE — Telephone Encounter (Signed)
Forms were faxed to and received by [facility].

## 2013-08-19 ENCOUNTER — Other Ambulatory Visit: Payer: Self-pay | Admitting: *Deleted

## 2013-08-19 ENCOUNTER — Ambulatory Visit (INDEPENDENT_AMBULATORY_CARE_PROVIDER_SITE_OTHER): Payer: Medicaid Other | Admitting: Cardiovascular Disease

## 2013-08-19 ENCOUNTER — Encounter: Payer: Self-pay | Admitting: Cardiovascular Disease

## 2013-08-19 VITALS — BP 146/83 | HR 88 | Ht 64.0 in | Wt 179.0 lb

## 2013-08-19 DIAGNOSIS — Z720 Tobacco use: Secondary | ICD-10-CM

## 2013-08-19 DIAGNOSIS — E1159 Type 2 diabetes mellitus with other circulatory complications: Secondary | ICD-10-CM

## 2013-08-19 DIAGNOSIS — E1151 Type 2 diabetes mellitus with diabetic peripheral angiopathy without gangrene: Secondary | ICD-10-CM

## 2013-08-19 DIAGNOSIS — M79609 Pain in unspecified limb: Secondary | ICD-10-CM

## 2013-08-19 DIAGNOSIS — I428 Other cardiomyopathies: Secondary | ICD-10-CM

## 2013-08-19 DIAGNOSIS — I4891 Unspecified atrial fibrillation: Secondary | ICD-10-CM

## 2013-08-19 DIAGNOSIS — E039 Hypothyroidism, unspecified: Secondary | ICD-10-CM

## 2013-08-19 DIAGNOSIS — F17200 Nicotine dependence, unspecified, uncomplicated: Secondary | ICD-10-CM

## 2013-08-19 DIAGNOSIS — M79604 Pain in right leg: Secondary | ICD-10-CM

## 2013-08-19 DIAGNOSIS — I429 Cardiomyopathy, unspecified: Secondary | ICD-10-CM

## 2013-08-19 DIAGNOSIS — I798 Other disorders of arteries, arterioles and capillaries in diseases classified elsewhere: Secondary | ICD-10-CM

## 2013-08-19 MED ORDER — HYDROCODONE-ACETAMINOPHEN 10-325 MG PO TABS: 1.0000 | ORAL_TABLET | Freq: Three times a day (TID) | ORAL | Status: DC | PRN

## 2013-08-19 NOTE — Assessment & Plan Note (Signed)
Maint NSR  INR;s not very Rx F/U at [facility] no bleeding issues

## 2013-08-19 NOTE — Telephone Encounter (Signed)
Pt's niece states pt complaining of pain in feet and would like Vicodin refill.  Informed pt [phone] that we would refill this time, but she would need to discuss pain medication with her vascular doctor.  Pt informed would need to pick up the rx here.

## 2013-08-19 NOTE — Assessment & Plan Note (Signed)
TSH near 12 in September  Would increase synthroid to 75ug  F/U with primary

## 2013-08-19 NOTE — Assessment & Plan Note (Signed)
Discussed low carb diet.  Target hemoglobin A1c is 6.5 or less.  Continue current medications. Very poor insight into management and diet

## 2013-08-19 NOTE — Assessment & Plan Note (Signed)
Not clear of diagnosis ECG fairly benign  F/U echo to assess LV and RV function

## 2013-08-19 NOTE — Patient Instructions (Addendum)
Your physician has requested that you have a lexiscan myoview. For further information please visit [email or web address]. Please follow instruction sheet, as given.   Your physician has requested that you have an echocardiogram. Echocardiography is a painless test that uses sound waves to create images of your heart. It provides your doctor with information about the size and shape of your heart and how well your heart's chambers and valves are working. This procedure takes approximately one hour. There are no restrictions for this procedure.   Your physician recommends that you continue on your current medications as directed. Please refer to the Current Medication list given to you today.  Your physician wants you to follow-up in: 1 Year You will receive a reminder letter in the mail two months in advance. If you don't receive a letter, please call our office to schedule the follow-up appointment.

## 2013-08-19 NOTE — Assessment & Plan Note (Signed)
Not clear who she has been set up to see.  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  Plan per primary since she was not referred to [place] for PVD or LE testing

## 2013-08-19 NOTE — Assessment & Plan Note (Signed)
Counseled for less than 10 minutes Link between smoking and vascular disease discussed Little motivation to quit

## 2013-08-19 NOTE — Progress Notes (Signed)
Patient ID: [name], female   DOB: January 19, 1961, 52 y.o.   MRN: [id number]      52 yo referred by Dr Tery Sanfilippo for DCM.  I see no evaluation of EF in EPIC  Has poorly controlled DM, HTN and elevated lipids Has had venous insufficiency with heal ulcers.  Smoking, ETOH and alcohol abuse with mention of cirrhosis TSH in September elevated at 11.5  Supposed to be on coumadin ? For PAF in [place]    Most of her INR/s are not Rx  Chronic foot pain seen in ER 9/27  Has been on naproxen and antibiotics by podiatrist.  ABI's ordered but not done No clear diagnosis of PVD established She thinks her ABI's were done this week at [facility]?.  Pain in feet seems more neuropathic.  Has hip pain with ambulation right greater than left No true calf claudication    ROS: Denies fever, malais, weight loss, blurry vision, decreased visual acuity, cough, sputum, SOB, hemoptysis, pleuritic pain, palpitaitons, heartburn, abdominal pain, melena, lower extremity edema, claudication, or rash.  All other systems reviewed and negative   General: Affect appropriate Chronically ill female  HEENT: normal Neck supple with no adenopathy JVP normal no bruits no thyromegaly Lungs clear with no wheezing and good diaphragmatic motion Heart:  S1/S2 no murmur,rub, gallop or click PMI normal Abdomen: benighn, BS positve, no tenderness, no AAA no bruit.  No HSM or HJR Distal pulses intact femorally but hard to palpabe below knees No bruits No edema Neuro non-focal Skin warm and dry No muscular weakness  Medications Current Outpatient Prescriptions  Medication Sig Dispense Refill  . aspirin EC 81 MG tablet Take 1 tablet (81 mg total) by mouth daily.  120 tablet  3  . carvedilol (COREG) 6.25 MG tablet Take 1 tablet (6.25 mg total) by mouth 2 (two) times daily with a meal.  60 tablet  3  . furosemide (LASIX) 40 MG tablet Take 1 tablet (40 mg total) by mouth daily.  30 tablet  3  . gabapentin (NEURONTIN) 100 MG capsule  Take 1 capsule (100 mg total) by mouth 3 (three) times daily.  90 capsule  0  . glipiZIDE (GLUCOTROL XL) 10 MG 24 hr tablet Take 1 tablet (10 mg total) by mouth daily with breakfast.  30 tablet  3  . levothyroxine (SYNTHROID, LEVOTHROID) 50 MCG tablet Take 1 tablet (50 mcg total) by mouth daily.  120 tablet  3  . lisinopril (PRINIVIL,ZESTRIL) 10 MG tablet Take 10 mg by mouth daily.      . magnesium oxide (MAG-OX) 400 MG tablet Take 1 tablet (400 mg total) by mouth daily.  30 tablet  1  . metFORMIN (GLUCOPHAGE) 1000 MG tablet Take 1 tablet (1,000 mg total) by mouth 2 (two) times daily with a meal.  60 tablet  3  . naproxen (NAPROSYN) 500 MG tablet Take 1 tablet (500 mg total) by mouth 2 (two) times daily with a meal.  45 tablet  1  . silver sulfADIAZINE (SILVADENE) 1 % cream Apply topically daily. Apply to foot  50 g  0  . traMADol (ULTRAM) 50 MG tablet Take 1 tablet (50 mg total) by mouth every 6 (six) hours as needed for pain.  30 tablet  0  . warfarin (COUMADIN) 5 MG tablet Take 1 tablet (5 mg total) by mouth daily.  30 tablet  3   No current facility-administered medications for this visit.    Allergies Review of patient's allergies indicates  no known allergies.  Family History: Family History  Problem Relation Age of Onset  . Hypertension Mother   . Diabetes Mother   . Cancer Mother     breast, ovarian, colon  . Hypertension Father     Social History: History   Social History  . Marital Status: Divorced    Spouse Name: N/A    Number of Children: N/A  . Years of Education: N/A   Occupational History  . disabled    Social History Main Topics  . Smoking status: Current Every Day Smoker -- 1.00 packs/day    Types: Cigarettes  . Smokeless tobacco: Not on file  . Alcohol Use: Yes     Comment: has not used in 6 or 7 months  . Drug Use: Yes    Special: "Crack" cocaine, Marijuana     Comment: patient states has not used in 3 months  . Sexual Activity: Not Currently    Other Topics Concern  . Not on file   Social History Narrative  . No narrative on file    Electrocardiogram:  06/13/13  SR rate 70 nonspecific T wave changes   Assessment and Plan

## 2013-08-20 ENCOUNTER — Encounter: Payer: Self-pay | Admitting: Cardiovascular Disease

## 2013-08-20 ENCOUNTER — Ambulatory Visit (INDEPENDENT_AMBULATORY_CARE_PROVIDER_SITE_OTHER): Payer: Medicaid Other | Admitting: Cardiovascular Disease

## 2013-08-20 ENCOUNTER — Telehealth: Payer: Self-pay | Admitting: *Deleted

## 2013-08-20 VITALS — BP 130/78 | HR 81 | Ht 64.0 in | Wt 180.8 lb

## 2013-08-20 DIAGNOSIS — L98499 Non-pressure chronic ulcer of skin of other sites with unspecified severity: Secondary | ICD-10-CM

## 2013-08-20 DIAGNOSIS — I70209 Unspecified atherosclerosis of native arteries of extremities, unspecified extremity: Secondary | ICD-10-CM

## 2013-08-20 DIAGNOSIS — I4891 Unspecified atrial fibrillation: Secondary | ICD-10-CM

## 2013-08-20 DIAGNOSIS — I739 Peripheral vascular disease, unspecified: Secondary | ICD-10-CM

## 2013-08-20 DIAGNOSIS — I119 Hypertensive heart disease without heart failure: Secondary | ICD-10-CM

## 2013-08-20 NOTE — Progress Notes (Signed)
08/20/2013 [name]   1961/08/08  [id number]  Primary Physician Angelica Chessman, MD Primary Cardiologist: [name] MD Renae Gloss   HPI:  [name] is a 52 year old moderately overweight single Caucasian female mother of one child who lives with her sister. She was referred by Dr. Franki Monte at [facility] for peripheral vascular evaluation because of critical limb ischemia. She saw Dr. Jenkins Rouge at [facility]  for evaluation of cardiomyopathy and proximal atrial fibrillation.risk factors include a 20-pack-year history of tobacco abuse, treated diabetes, and hypertension as well as family history of heart disease. She's never had a heart attack or stroke. She does have COPD. She had paroxysmal atrial fibrillation in the past and has undergone cardioversion in [place].. She is currently in sinus rhythm on Coumadin anticoagulation. A 2-D echo was ordered by Dr. Nolon Lennert. The [place] Doppler studies performed in our office suggested critical limb ischemia with an occluded SFA and popliteal arteries bilaterally with tibial vessel disease as well. She complains of lifestyle limiting claudication.   Current Outpatient Prescriptions  Medication Sig Dispense Refill  . aspirin EC 81 MG tablet Take 1 tablet (81 mg total) by mouth daily.  120 tablet  3  . carvedilol (COREG) 6.25 MG tablet Take 1 tablet (6.25 mg total) by mouth 2 (two) times daily with a meal.  60 tablet  3  . furosemide (LASIX) 40 MG tablet Take 1 tablet (40 mg total) by mouth daily.  30 tablet  3  . glipiZIDE (GLUCOTROL XL) 10 MG 24 hr tablet Take 1 tablet (10 mg total) by mouth daily with breakfast.  30 tablet  3  . levothyroxine (SYNTHROID, LEVOTHROID) 50 MCG tablet Take 1 tablet (50 mcg total) by mouth daily.  120 tablet  3  . lisinopril (PRINIVIL,ZESTRIL) 10 MG tablet Take 10 mg by mouth daily.      . magnesium oxide (MAG-OX) 400 MG tablet Take 1 tablet (400 mg total) by mouth  daily.  30 tablet  1  . metFORMIN (GLUCOPHAGE) 1000 MG tablet Take 1 tablet (1,000 mg total) by mouth 2 (two) times daily with a meal.  60 tablet  3  . silver sulfADIAZINE (SILVADENE) 1 % cream Apply topically daily. Apply to foot  50 g  0  . warfarin (COUMADIN) 5 MG tablet Take 1 tablet (5 mg total) by mouth daily.  30 tablet  3   No current facility-administered medications for this visit.    No Known Allergies  History   Social History  . Marital Status: Divorced    Spouse Name: N/A    Number of Children: N/A  . Years of Education: N/A   Occupational History  . disabled    Social History Main Topics  . Smoking status: Current Every Day Smoker -- 1.00 packs/day    Types: Cigarettes  . Smokeless tobacco: Never Used  . Alcohol Use: No     Comment: has not used in 6 or 7 months  . Drug Use: Yes    Special: "Crack" cocaine, Marijuana     Comment: patient states has not used in 3 months  . Sexual Activity: Not Currently   Other Topics Concern  . Not on file   Social History Narrative  . No narrative on file     Review of Systems: General: negative for chills, fever, night sweats or weight changes.  Cardiovascular: negative for chest pain, dyspnea on exertion, edema, orthopnea, palpitations, paroxysmal nocturnal dyspnea  or shortness of breath Dermatological: negative for rash Respiratory: negative for cough or wheezing Urologic: negative for hematuria Abdominal: negative for nausea, vomiting, diarrhea, bright red blood per rectum, melena, or hematemesis Neurologic: negative for visual changes, syncope, or dizziness All other systems reviewed and are otherwise negative except as noted above.    Blood pressure 130/78, pulse 81, height 5\' 4"  (1.626 m), weight 180 lb 12.8 oz (82.01 kg), last menstrual period 11/27/2012.  General appearance: alert and no distress Neck: no adenopathy, no carotid bruit, no JVD, supple, symmetrical, trachea midline and thyroid not enlarged,  symmetric, no tenderness/mass/nodules Lungs: clear to auscultation bilaterally Heart: regular rate and rhythm, S1, S2 normal, no murmur, click, rub or gallop Abdomen: soft, non-tender; bowel sounds normal; no masses,  no organomegaly Extremities: extremities normal, atraumatic, no cyanosis or edema and absent pedal pulses with nonhealing right heel ulcers. Pulses: 2+ and symmetric absent pulses  EKG normal sinus rhythm at 81 without ST or T wave changes  ASSESSMENT AND PLAN:   Atherosclerotic peripheral vascular disease with ulceration Patient was referred by Dr. Kendell Bane from [place] for nonhealing right heel ulcer. She also has bilateral lower extremity lifestyle limiting claudication.lower extremities extremity arterial Dopplers were performed 08/13/13 revealing an occluded distal right SFA and popliteal artery with occluded tibial vessels as well. Her left ABI was 0.44 with similar anatomy. Based on this, her symptoms and her nonhealing ulcer she will need angiography and potential percutaneous intervention for critical limb ischemia.  Atrial fibrillation History of DC cardioversion [place] several times on Coumadin anticoagulation since that time. She is currently in sinus rhythm.      [name] MD FACP,FACC,FAHA, [facility] 08/20/2013 11:16 AM

## 2013-08-20 NOTE — Telephone Encounter (Signed)
Pt presented to office, stating the pharmacy did not have the Vicodin ordered yesterday.  I reminded the pt in instructed to pick up the prescription in office.  I gave the pt the Vicodin rx and reminded the pt her vascular surgeon would be handling her pain management.

## 2013-08-20 NOTE — Assessment & Plan Note (Signed)
Patient was referred by Dr. Kendell Bane from [place] for nonhealing right heel ulcer. She also has bilateral lower extremity lifestyle limiting claudication.lower extremities extremity arterial Dopplers were performed 08/13/13 revealing an occluded distal right SFA and popliteal artery with occluded tibial vessels as well. Her left ABI was 0.44 with similar anatomy. Based on this, her symptoms and her nonhealing ulcer she will need angiography and potential percutaneous intervention for critical limb ischemia.

## 2013-08-20 NOTE — Assessment & Plan Note (Signed)
History of DC cardioversion [place] several times on Coumadin anticoagulation since that time. She is currently in sinus rhythm.

## 2013-08-20 NOTE — Patient Instructions (Addendum)
Your physician has requested that you have a peripheral vascular angiogram. This exam is performed at the hospital. During this exam IV contrast is used to look at arterial blood flow. Please review the information sheet given for details. This will be done in November with left groin access. Representatives will be scott and tara.

## 2013-08-21 ENCOUNTER — Ambulatory Visit (INDEPENDENT_AMBULATORY_CARE_PROVIDER_SITE_OTHER): Payer: Medicaid Other | Admitting: Obstetrics & Gynecology

## 2013-08-21 ENCOUNTER — Encounter: Payer: Self-pay | Admitting: Obstetrics & Gynecology

## 2013-08-21 ENCOUNTER — Other Ambulatory Visit: Payer: Self-pay | Admitting: *Deleted

## 2013-08-21 ENCOUNTER — Encounter: Payer: Self-pay | Admitting: *Deleted

## 2013-08-21 ENCOUNTER — Encounter: Payer: Self-pay | Admitting: Cardiovascular Disease

## 2013-08-21 VITALS — BP 139/85 | HR 92 | Temp 97.9°F | Ht 64.0 in | Wt 180.0 lb

## 2013-08-21 DIAGNOSIS — N888 Other specified noninflammatory disorders of cervix uteri: Secondary | ICD-10-CM

## 2013-08-21 DIAGNOSIS — Z01818 Encounter for other preprocedural examination: Secondary | ICD-10-CM

## 2013-08-21 DIAGNOSIS — Z23 Encounter for immunization: Secondary | ICD-10-CM

## 2013-08-21 DIAGNOSIS — N72 Inflammatory disease of cervix uteri: Secondary | ICD-10-CM

## 2013-08-21 NOTE — Progress Notes (Signed)
  Subjective:    Patient ID: [name], female    DOB: 01/10/1961, 52 y.o.   MRN: [id number]  HPI  52 yo lady who was referred here because the provider at [facility] saw a cyst on her cervix. The pap smear done there was normal. She denies any vaginal or vulvar problems.  Review of Systems Her flu vaccine is due.    Objective:   Physical Exam Nabothian cyst seen on her cervix at the 5 o'clock position Normal vulva/vagina       Assessment & Plan:  Nabothian cyst- reassurance given

## 2013-08-21 NOTE — Patient Instructions (Signed)
Breast Self-Awareness  Practicing breast self-awareness may pick up problems early, prevent significant medical complications, and possibly save your life. By practicing breast self-awareness, you can become familiar with how your breasts look and feel and if your breasts are changing. This allows you to notice changes early. It can also offer you some reassurance that your breast health is good. One way to learn what is normal for your breasts and whether your breasts are changing is to do a breast self-exam.  If you find a lump or something that was not present in the past, it is best to contact your caregiver right away. Other findings that should be evaluated by your caregiver include nipple discharge, especially if it is bloody; skin changes or reddening; areas where the skin seems to be pulled in (retracted); or new lumps and bumps. Breast pain is seldom associated with cancer (malignancy), but should also be evaluated by a caregiver.  HOW TO PERFORM A BREAST SELF-EXAM  The best time to examine your breasts is 5 7 days after your menstrual period is over. During menstruation, the breasts are lumpier, and it may be more difficult to pick up changes. If you do not menstruate, have reached menopause, or had your uterus removed (hysterectomy), you should examine your breasts at regular intervals, such as monthly. If you are breastfeeding, examine your breasts after a feeding or after using a breast pump. Breast implants do not decrease the risk for lumps or tumors, so continue to perform breast self-exams as recommended. Talk to your caregiver about how to determine the difference between the implant and breast tissue. Also, talk about the amount of pressure you should use during the exam. Over time, you will become more familiar with the variations of your breasts and more comfortable with the exam. A breast self-exam requires you to remove all your clothes above the waist.  1. Look at your breasts and nipples.  Stand in front of a mirror in a room with good lighting. With your hands on your hips, push your hands firmly downward. Look for a difference in shape, contour, and size from one breast to the other (asymmetry). Asymmetry includes puckers, dips, or bumps. Also, look for skin changes, such as reddened or scaly areas on the breasts. Look for nipple changes, such as discharge, dimpling, repositioning, or redness.  2. Carefully feel your breasts. This is best done either in the shower or tub while using soapy water or when flat on your back. Place the arm (on the side of the breast you are examining) above  your head. Use the pads (not the fingertips) of your three middle fingers on your opposite hand to feel your breasts. Start in the underarm area and use ¾ inch (2 cm) overlapping circles to feel your breast. Use 3 different levels of pressure (light, medium, and firm pressure) at each circle before moving to the next circle. The light pressure is needed to feel the tissue closest to the skin. The medium pressure will help to feel breast tissue a little deeper, while the firm pressure is needed to feel the tissue close to the ribs. Continue the overlapping circles, moving downward over the breast until you feel your ribs below your breast. Then, move one finger-width towards the center of the body. Continue to use the ¾ inch (2 cm) overlapping circles to feel your breast as you move slowly up toward the collar bone (clavicle) near the base of the neck. Continue the up and down exam using   all 3 pressures until you reach the middle of the chest. Do this with each breast, carefully feeling for lumps or changes.  3.  Keep a written record with breast changes or normal findings for each breast. By writing this information down, you do not need to depend only on memory for size, tenderness, or location. Write down where you are in your menstrual cycle, if you are still menstruating.  Breast tissue can have some lumps or  thick tissue. However, see your caregiver if you find anything that concerns you.    SEEK MEDICAL CARE IF:  · You see a change in shape, contour, or size of your breasts or nipples.    · You see skin changes, such as reddened or scaly areas on the breasts or nipples.    · You have an unusual discharge from your nipples.    · You feel a new lump or unusually thick areas.    Document Released: 10/31/2005 Document Revised: 10/17/2012 Document Reviewed: 02/15/2012  [employer]® Patient Information ©2014 [employer].

## 2013-08-22 ENCOUNTER — Telehealth: Payer: Self-pay | Admitting: *Deleted

## 2013-08-22 NOTE — Telephone Encounter (Signed)
Called to inform patient that I will be mailing out her lab slips and chest xray information. I will try to call again later.

## 2013-08-27 ENCOUNTER — Other Ambulatory Visit: Payer: Self-pay | Admitting: *Deleted

## 2013-08-27 DIAGNOSIS — Z01818 Encounter for other preprocedural examination: Secondary | ICD-10-CM

## 2013-08-28 ENCOUNTER — Telehealth: Payer: Self-pay | Admitting: Cardiovascular Disease

## 2013-08-28 ENCOUNTER — Other Ambulatory Visit: Payer: Medicaid Other

## 2013-08-28 NOTE — Telephone Encounter (Signed)
Has received paperwork regarding upcoming procedure  Does not understand  Needs to talk to nurse to get instructions regarding x ray and labwork  Please call

## 2013-08-28 NOTE — Telephone Encounter (Signed)
Called patient who allowed RN to speak with nephew who helps handle care. Informed that both pre-procedure labs & chest x-ray are both done in the [facility] and that appointments are not necessary for either. Questions answered to patient/family member's satisfaction.

## 2013-08-29 ENCOUNTER — Ambulatory Visit: Payer: Medicaid Other

## 2013-09-02 ENCOUNTER — Telehealth: Payer: Self-pay | Admitting: *Deleted

## 2013-09-02 NOTE — Telephone Encounter (Signed)
Lorre Munroe and [facility] called asking about pt's pain management from our office.  I informed them that pt was being treated by [facility] for circulation problems and that her pain was from poor circulation and she informed our office that [facility] would be performing a procedure. Pt's pain is from the circulation and is being now managed by [facility].

## 2013-09-04 ENCOUNTER — Ambulatory Visit (HOSPITAL_COMMUNITY): Payer: Medicaid Other | Attending: Cardiology | Admitting: Cardiology

## 2013-09-04 ENCOUNTER — Ambulatory Visit (HOSPITAL_BASED_OUTPATIENT_CLINIC_OR_DEPARTMENT_OTHER): Payer: Medicaid Other | Admitting: Radiology

## 2013-09-04 ENCOUNTER — Telehealth: Payer: Self-pay | Admitting: Cardiovascular Disease

## 2013-09-04 DIAGNOSIS — F17200 Nicotine dependence, unspecified, uncomplicated: Secondary | ICD-10-CM | POA: Insufficient documentation

## 2013-09-04 DIAGNOSIS — I079 Rheumatic tricuspid valve disease, unspecified: Secondary | ICD-10-CM | POA: Insufficient documentation

## 2013-09-04 DIAGNOSIS — E119 Type 2 diabetes mellitus without complications: Secondary | ICD-10-CM | POA: Insufficient documentation

## 2013-09-04 DIAGNOSIS — R0602 Shortness of breath: Secondary | ICD-10-CM

## 2013-09-04 DIAGNOSIS — I4891 Unspecified atrial fibrillation: Secondary | ICD-10-CM

## 2013-09-04 DIAGNOSIS — I428 Other cardiomyopathies: Secondary | ICD-10-CM

## 2013-09-04 DIAGNOSIS — I429 Cardiomyopathy, unspecified: Secondary | ICD-10-CM

## 2013-09-04 MED ORDER — TECHNETIUM TC 99M SESTAMIBI GENERIC - CARDIOLITE
30.0000 | Freq: Once | INTRAVENOUS | Status: AC | PRN
  Administered 2013-09-04: 30 via INTRAVENOUS

## 2013-09-04 NOTE — Telephone Encounter (Signed)
I spoke with [name] and verified with her that I could speak with [name].  [name] expressed his deep concern about the pain that [name] is in.  She is not able to get any relief from the constant lower extremity pain that she is experiencing. Her primary doctor and [facility] will not give her any additional pain medicine.    I will speak with Dr Gwenlyn Found about the pain medicine and talk with the schedulers to see if I can get her procedure moved up.

## 2013-09-04 NOTE — Telephone Encounter (Signed)
[name] is calling because [name] is in severe pain .[name] She saw Dr. Gwenlyn Found and is asking if he can prescribe some type of pain medication to help her with the pain. She is unable to walk and sleep due to the pain ,also she is schedule for a procedure with Dr. Gwenlyn Found on 09/24/2013. Please Call  [phone].[name]   Thanks

## 2013-09-04 NOTE — Progress Notes (Signed)
Echo performed. 

## 2013-09-04 NOTE — Telephone Encounter (Signed)
Procedure was moved up to 10/28.  [name] aware of date change. I reviewed all instructions including holding coumadin starting 10/25 and glucophage starting 10/27.  Dr Gwenlyn Found will not prescribe any additional pain meds.

## 2013-09-04 NOTE — Telephone Encounter (Signed)
Chart reviewed and pt saw Dr. Gwenlyn Found on 10.07.14.  Will defer to Dr. Cecil Cobbs, RN.

## 2013-09-06 ENCOUNTER — Ambulatory Visit (HOSPITAL_COMMUNITY): Payer: Medicaid Other | Attending: Cardiology | Admitting: Radiology

## 2013-09-06 ENCOUNTER — Ambulatory Visit
Admission: RE | Admit: 2013-09-06 | Discharge: 2013-09-06 | Disposition: A | Discharge status: Home / Self Care | Payer: Medicaid Other | Source: Ambulatory Visit | Attending: Cardiovascular Disease | Admitting: Cardiovascular Disease

## 2013-09-06 ENCOUNTER — Telehealth: Payer: Self-pay | Admitting: *Deleted

## 2013-09-06 ENCOUNTER — Telehealth: Payer: Self-pay | Admitting: Cardiovascular Disease

## 2013-09-06 VITALS — BP 148/74 | HR 91 | Ht 64.0 in | Wt 186.0 lb

## 2013-09-06 DIAGNOSIS — E119 Type 2 diabetes mellitus without complications: Secondary | ICD-10-CM | POA: Insufficient documentation

## 2013-09-06 DIAGNOSIS — I739 Peripheral vascular disease, unspecified: Secondary | ICD-10-CM | POA: Insufficient documentation

## 2013-09-06 DIAGNOSIS — R0602 Shortness of breath: Secondary | ICD-10-CM | POA: Insufficient documentation

## 2013-09-06 DIAGNOSIS — I10 Essential (primary) hypertension: Secondary | ICD-10-CM | POA: Insufficient documentation

## 2013-09-06 DIAGNOSIS — Z01811 Encounter for preprocedural respiratory examination: Secondary | ICD-10-CM

## 2013-09-06 DIAGNOSIS — I4891 Unspecified atrial fibrillation: Secondary | ICD-10-CM | POA: Insufficient documentation

## 2013-09-06 DIAGNOSIS — Z01818 Encounter for other preprocedural examination: Secondary | ICD-10-CM

## 2013-09-06 DIAGNOSIS — F17200 Nicotine dependence, unspecified, uncomplicated: Secondary | ICD-10-CM | POA: Insufficient documentation

## 2013-09-06 LAB — CBC
HCT: 35.3 % — ABNORMAL LOW (ref 36.0–46.0)
MCH: 30.1 pg (ref 26.0–34.0)
MCHC: 32.9 g/dL (ref 30.0–36.0)
Platelets: 358 10*3/uL (ref 150–400)
RBC: 3.86 MIL/uL — ABNORMAL LOW (ref 3.87–5.11)
RDW: 13.5 % (ref 11.5–15.5)

## 2013-09-06 LAB — BASIC METABOLIC PANEL
CO2: 30 mEq/L (ref 19–32)
Calcium: 9.4 mg/dL (ref 8.4–10.5)
Sodium: 138 mEq/L (ref 135–145)

## 2013-09-06 LAB — PROTIME-INR
INR: 1.01 (ref <1.50)
Prothrombin Time: 13.3 seconds (ref 11.6–15.2)

## 2013-09-06 LAB — APTT: aPTT: 30 seconds (ref 24–37)

## 2013-09-06 MED ORDER — AMINOPHYLLINE 25 MG/ML IV SOLN
50.0000 mg | Freq: Once | INTRAVENOUS | Status: AC
  Administered 2013-09-06: 50 mg via INTRAVENOUS

## 2013-09-06 MED ORDER — REGADENOSON 0.4 MG/5ML IV SOLN
0.4000 mg | Freq: Once | INTRAVENOUS | Status: AC
  Administered 2013-09-06: 0.4 mg via INTRAVENOUS

## 2013-09-06 MED ORDER — TECHNETIUM TC 99M SESTAMIBI GENERIC - CARDIOLITE
30.0000 | Freq: Once | INTRAVENOUS | Status: AC | PRN
  Administered 2013-09-06: 30 via INTRAVENOUS

## 2013-09-06 NOTE — Telephone Encounter (Signed)
Call from [facility].  No order for CXR.  Order released.

## 2013-09-06 NOTE — Telephone Encounter (Signed)
[facility] phoned stating they can see the order for the CXR, however,they cannot acsess it. Order looks like it is somehow attached to another visit. Order re-entered into the computer.

## 2013-09-06 NOTE — Progress Notes (Signed)
[address] NUCLEAR MED [place], [place] [address] [phone]    Cardiology Nuclear Med Study  [name] is a 52 y.o. female     MRN : [id number]     DOB: [birth year]  Procedure Date: 09/06/2013  Nuclear Med Background Indication for Stress Test:  Evaluation for Ischemia History:  Atrial Fibrillation Cardiac Risk Factors: Hypertension, Lipids, NIDDM, PVD and Smoker  Symptoms:  SOB   Nuclear Pre-Procedure Caffeine/Decaff Intake:  None NPO After: 8:00am   Lungs:  clear O2 Sat: 95% on room air. IV 0.9% NS with Angio Cath:  24g  IV Site: R Hand  IV Started by:  Jennelle Human, CNMT  Chest Size (in):  38 Cup Size: D  Height: 5\' 4"  (1.626 m)  Weight:  186 lb (84.369 kg)  BMI:  Body mass index is 31.91 kg/(m^2). Tech Comments:  None.     Nuclear Med Study 1 or 2 day study: 2 day  Stress Test Type:  Lexiscan  Reading MD: Bonney Leitz. Order Authorizing Provider:  Jenkins Rouge, MD  Resting Radionuclide: Technetium 67m Sestamibi  Resting Radionuclide Dose: 32.0 mCi   On      09-04-13  Stress Radionuclide:  Technetium 67m Sestamibi  Stress Radionuclide Dose: 33.0 mCi   On         09-06-13          Stress Protocol Rest HR: 91 Stress HR: 106  Rest BP: 148/74 Stress BP: 154/81  Exercise Time (min): n/a METS: n/a   Predicted Max HR: 170 bpm % Max HR: 62.35 bpm Rate Pressure Product: 16324   Dose of Adenosine (mg):  n/a Dose of Lexiscan: 0.4 mg  Dose of Atropine (mg): n/a Dose of Dobutamine: n/a mcg/kg/min (at max HR)  Stress Test Technologist: Glade Lloyd, BS-ES  Nuclear Technologist:  Jennelle Human, CNMT     Rest Procedure:  Myocardial perfusion imaging was performed at rest 45 minutes following the intravenous administration of Technetium 67m Sestamibi. Rest ECG: NSR - Normal EKG  Stress Procedure:  The patient received IV Lexiscan 0.4 mg over 15-seconds.  Technetium 54m Sestamibi injected at 30-seconds.  Quantitative spect  images were obtained after a 45 minute delay. During the infusion of Lexiscan, the patient became dizzy, stomach hurt, headache and very nauseated.  50mg  Aminophylline was given by Jennelle Human, CNMT and patients symptoms resolved with the exception of a slight headache.  Stress ECG: No significant change from baseline ECG  QPS Raw Data Images:  Normal; no motion artifact; normal heart/lung ratio. Stress Images:  Normal homogeneous uptake in all areas of the myocardium. Rest Images:  Comparison with the stress images reveals no significant change. Subtraction (SDS):  No evidence of ischemia. Transient Ischemic Dilatation (Normal <1.22):  1.14 Lung/Heart Ratio (Normal <0.45):  0.21  Quantitative Gated Spect Images QGS EDV:  132 ml QGS ESV:  69 ml  Impression Exercise Capacity:  Lexiscan with no exercise. BP Response:  Normal blood pressure response. Clinical Symptoms:  No chest pain. ECG Impression:  No significant ST segment change suggestive of ischemia. Comparison with Prior Nuclear Study: No images to compare  Overall Impression:  Low risk stress nuclear study. No reversible ischemia. LV systolic function is mildly depressed..  LV Ejection Fraction: 48%.  LV Wall Motion:  Mild global depression of LV systolic function without segmental wall motion abnormalities.  Darlin Coco

## 2013-09-09 NOTE — Addendum Note (Signed)
Addended by: Charlton Amor on: 09/09/2013 10:45 AM   Modules accepted: Orders

## 2013-09-10 ENCOUNTER — Encounter: Payer: Self-pay | Admitting: *Deleted

## 2013-09-10 ENCOUNTER — Ambulatory Visit (HOSPITAL_COMMUNITY)
Admission: RE | Admit: 2013-09-10 | Discharge: 2013-09-11 | Disposition: A | Discharge status: Home / Self Care | Payer: Medicaid Other | Source: Ambulatory Visit | Attending: Cardiovascular Disease | Admitting: Cardiovascular Disease

## 2013-09-10 ENCOUNTER — Encounter (HOSPITAL_COMMUNITY)
Admission: RE | Disposition: A | Discharge status: Home / Self Care | Payer: Self-pay | Source: Ambulatory Visit | Attending: Cardiovascular Disease

## 2013-09-10 ENCOUNTER — Encounter (HOSPITAL_COMMUNITY): Payer: Self-pay | Admitting: General Practice

## 2013-09-10 DIAGNOSIS — E663 Overweight: Secondary | ICD-10-CM | POA: Insufficient documentation

## 2013-09-10 DIAGNOSIS — I429 Cardiomyopathy, unspecified: Secondary | ICD-10-CM

## 2013-09-10 DIAGNOSIS — Z7901 Long term (current) use of anticoagulants: Secondary | ICD-10-CM

## 2013-09-10 DIAGNOSIS — I70219 Atherosclerosis of native arteries of extremities with intermittent claudication, unspecified extremity: Secondary | ICD-10-CM

## 2013-09-10 DIAGNOSIS — M79604 Pain in right leg: Secondary | ICD-10-CM

## 2013-09-10 DIAGNOSIS — J449 Chronic obstructive pulmonary disease, unspecified: Secondary | ICD-10-CM | POA: Insufficient documentation

## 2013-09-10 DIAGNOSIS — I70209 Unspecified atherosclerosis of native arteries of extremities, unspecified extremity: Secondary | ICD-10-CM

## 2013-09-10 DIAGNOSIS — J4489 Other specified chronic obstructive pulmonary disease: Secondary | ICD-10-CM | POA: Insufficient documentation

## 2013-09-10 DIAGNOSIS — E1151 Type 2 diabetes mellitus with diabetic peripheral angiopathy without gangrene: Secondary | ICD-10-CM

## 2013-09-10 DIAGNOSIS — Z794 Long term (current) use of insulin: Secondary | ICD-10-CM | POA: Diagnosis present

## 2013-09-10 DIAGNOSIS — E1159 Type 2 diabetes mellitus with other circulatory complications: Secondary | ICD-10-CM | POA: Insufficient documentation

## 2013-09-10 DIAGNOSIS — E119 Type 2 diabetes mellitus without complications: Secondary | ICD-10-CM | POA: Diagnosis present

## 2013-09-10 DIAGNOSIS — I4891 Unspecified atrial fibrillation: Secondary | ICD-10-CM | POA: Insufficient documentation

## 2013-09-10 DIAGNOSIS — I739 Peripheral vascular disease, unspecified: Secondary | ICD-10-CM | POA: Insufficient documentation

## 2013-09-10 DIAGNOSIS — L98499 Non-pressure chronic ulcer of skin of other sites with unspecified severity: Secondary | ICD-10-CM | POA: Insufficient documentation

## 2013-09-10 DIAGNOSIS — E1169 Type 2 diabetes mellitus with other specified complication: Secondary | ICD-10-CM | POA: Diagnosis present

## 2013-09-10 DIAGNOSIS — Z01818 Encounter for other preprocedural examination: Secondary | ICD-10-CM

## 2013-09-10 DIAGNOSIS — Z9862 Peripheral vascular angioplasty status: Secondary | ICD-10-CM

## 2013-09-10 HISTORY — DX: Gastro-esophageal reflux disease without esophagitis: K21.9

## 2013-09-10 HISTORY — DX: Hypothyroidism, unspecified: E03.9

## 2013-09-10 HISTORY — DX: Heart failure, unspecified: I50.9

## 2013-09-10 HISTORY — PX: LOWER EXTREMITY ANGIOGRAM: SHX5508

## 2013-09-10 HISTORY — DX: Unspecified osteoarthritis, unspecified site: M19.90

## 2013-09-10 LAB — PROTIME-INR: Prothrombin Time: 14.2 seconds (ref 11.6–15.2)

## 2013-09-10 LAB — POCT ACTIVATED CLOTTING TIME
Activated Clotting Time: 196 seconds
Activated Clotting Time: 217 seconds
Activated Clotting Time: 145 seconds

## 2013-09-10 SURGERY — ANGIOGRAM, LOWER EXTREMITY
Anesthesia: LOCAL

## 2013-09-10 MED ORDER — CLOPIDOGREL BISULFATE 75 MG PO TABS
75.0000 mg | ORAL_TABLET | Freq: Every day | ORAL | Status: DC
  Administered 2013-09-11: 75 mg via ORAL
  Filled 2013-09-10: qty 1

## 2013-09-10 MED ORDER — VERAPAMIL HCL 2.5 MG/ML IV SOLN
INTRAVENOUS | Status: AC
  Filled 2013-09-10: qty 2

## 2013-09-10 MED ORDER — ONDANSETRON HCL 4 MG/2ML IJ SOLN: 4.0000 mg | Freq: Four times a day (QID) | INTRAMUSCULAR | Status: DC | PRN

## 2013-09-10 MED ORDER — GLIPIZIDE ER 10 MG PO TB24
10.0000 mg | ORAL_TABLET | Freq: Every day | ORAL | Status: DC
  Filled 2013-09-10 (×2): qty 1

## 2013-09-10 MED ORDER — NITROGLYCERIN IN D5W 200-5 MCG/ML-% IV SOLN
INTRAVENOUS | Status: AC
  Filled 2013-09-10: qty 250

## 2013-09-10 MED ORDER — HYDRALAZINE HCL 20 MG/ML IJ SOLN
INTRAMUSCULAR | Status: AC
  Filled 2013-09-10: qty 1

## 2013-09-10 MED ORDER — ACETAMINOPHEN 325 MG PO TABS: 650.0000 mg | ORAL_TABLET | ORAL | Status: DC | PRN

## 2013-09-10 MED ORDER — FENTANYL CITRATE 0.05 MG/ML IJ SOLN
INTRAMUSCULAR | Status: AC
  Filled 2013-09-10: qty 2

## 2013-09-10 MED ORDER — MAGNESIUM OXIDE 400 MG PO TABS: 400.0000 mg | ORAL_TABLET | Freq: Every day | ORAL | Status: DC

## 2013-09-10 MED ORDER — MORPHINE SULFATE 2 MG/ML IJ SOLN
2.0000 mg | INTRAMUSCULAR | Status: DC | PRN
  Administered 2013-09-10 – 2013-09-11 (×6): 2 mg via INTRAVENOUS
  Filled 2013-09-10 (×6): qty 1

## 2013-09-10 MED ORDER — ASPIRIN EC 325 MG PO TBEC
325.0000 mg | DELAYED_RELEASE_TABLET | Freq: Every day | ORAL | Status: DC
  Filled 2013-09-10: qty 1

## 2013-09-10 MED ORDER — LEVOTHYROXINE SODIUM 50 MCG PO TABS
50.0000 ug | ORAL_TABLET | Freq: Every day | ORAL | Status: DC
  Administered 2013-09-10 – 2013-09-11 (×2): 50 ug via ORAL
  Filled 2013-09-10 (×3): qty 1

## 2013-09-10 MED ORDER — FUROSEMIDE 40 MG PO TABS
40.0000 mg | ORAL_TABLET | Freq: Every day | ORAL | Status: DC
  Administered 2013-09-11: 09:00:00 40 mg via ORAL
  Filled 2013-09-10 (×2): qty 1

## 2013-09-10 MED ORDER — MIDAZOLAM HCL 2 MG/2ML IJ SOLN
INTRAMUSCULAR | Status: AC
  Filled 2013-09-10: qty 2

## 2013-09-10 MED ORDER — SODIUM CHLORIDE 0.9 % IV SOLN
INTRAVENOUS | Status: DC
  Administered 2013-09-10: 08:00:00 via INTRAVENOUS

## 2013-09-10 MED ORDER — GLIPIZIDE ER 10 MG PO TB24
10.0000 mg | ORAL_TABLET | Freq: Every day | ORAL | Status: DC
  Administered 2013-09-11: 09:00:00 10 mg via ORAL
  Filled 2013-09-10 (×2): qty 1

## 2013-09-10 MED ORDER — ASPIRIN EC 81 MG PO TBEC
81.0000 mg | DELAYED_RELEASE_TABLET | Freq: Every day | ORAL | Status: DC
  Administered 2013-09-11: 09:00:00 81 mg via ORAL
  Filled 2013-09-10 (×2): qty 1

## 2013-09-10 MED ORDER — CARVEDILOL 6.25 MG PO TABS
6.2500 mg | ORAL_TABLET | Freq: Two times a day (BID) | ORAL | Status: DC
  Administered 2013-09-10 – 2013-09-11 (×2): 6.25 mg via ORAL
  Filled 2013-09-10 (×4): qty 1

## 2013-09-10 MED ORDER — SODIUM CHLORIDE 0.9 % IV SOLN: INTRAVENOUS | Status: AC

## 2013-09-10 MED ORDER — CLOPIDOGREL BISULFATE 300 MG PO TABS
ORAL_TABLET | ORAL | Status: AC
  Filled 2013-09-10: qty 1

## 2013-09-10 MED ORDER — DIAZEPAM 5 MG PO TABS
5.0000 mg | ORAL_TABLET | ORAL | Status: AC
  Administered 2013-09-10: 5 mg via ORAL

## 2013-09-10 MED ORDER — LISINOPRIL 10 MG PO TABS
10.0000 mg | ORAL_TABLET | Freq: Every day | ORAL | Status: DC
  Administered 2013-09-10 – 2013-09-11 (×2): 10 mg via ORAL
  Filled 2013-09-10 (×2): qty 1

## 2013-09-10 MED ORDER — ASPIRIN 81 MG PO CHEW
CHEWABLE_TABLET | ORAL | Status: AC
  Filled 2013-09-10: qty 1

## 2013-09-10 MED ORDER — SODIUM CHLORIDE 0.9 % IJ SOLN: 3.0000 mL | INTRAMUSCULAR | Status: DC | PRN

## 2013-09-10 MED ORDER — HEPARIN SODIUM (PORCINE) 1000 UNIT/ML IJ SOLN
INTRAMUSCULAR | Status: AC
  Filled 2013-09-10: qty 1

## 2013-09-10 MED ORDER — DIAZEPAM 5 MG PO TABS
ORAL_TABLET | ORAL | Status: AC
  Filled 2013-09-10: qty 1

## 2013-09-10 MED ORDER — ASPIRIN 81 MG PO CHEW
81.0000 mg | CHEWABLE_TABLET | ORAL | Status: AC
  Administered 2013-09-10: 81 mg via ORAL

## 2013-09-10 MED ORDER — MAGNESIUM OXIDE 400 (241.3 MG) MG PO TABS
400.0000 mg | ORAL_TABLET | Freq: Every day | ORAL | Status: DC
  Administered 2013-09-10: 19:00:00 400 mg via ORAL
  Filled 2013-09-10 (×2): qty 1

## 2013-09-10 NOTE — CV Procedure (Signed)
[name] is a 52 y.o. female    [id number] LOCATION:  FACILITY: [facility]  PHYSICIAN: Quay Burow, M.D. 13-Aug-1961   DATE OF PROCEDURE:  09/10/2013  DATE OF DISCHARGE:     PV Angiogram/Intervention    History obtained from chart review.[name] is a 52 year old moderately overweight single Caucasian female mother of one child who lives with her sister. She was referred by [name] at [facility] for peripheral vascular evaluation because of critical limb ischemia. She saw [name] at [facility] for evaluation of cardiomyopathy and proximal atrial fibrillation.risk factors include a 20-pack-year history of tobacco abuse, treated diabetes, and hypertension as well as family history of heart disease. She's never had a heart attack or stroke. She does have COPD. She had paroxysmal atrial fibrillation in the past and has undergone cardioversion in [place].. She is currently in sinus rhythm on Coumadin anticoagulation. A 2-D echo was ordered by [name]. The lower arterial Doppler studies performed in our office suggested critical limb ischemia with an occluded SFA and popliteal arteries bilaterally with tibial vessel disease as well. She complains of lifestyle limiting claudication.    PROCEDURE DESCRIPTION:   The patient was brought to the second floor [facility] lab in the postabsorptive state. She was premedicated with Valium 5 mg by mouth, IV Versed and fentanyl. Her left groinwas prepped and shaved in usual sterile fashion. Xylocaine 1% was used for local anesthesia. A 5 French sheath was inserted into the left common femoral  artery using standard Seldinger technique. A 5 French pigtail catheter was used for midstream abdominal aortography, bilateral iliac angiography with bifemoral runoff using bolus chase a digital subtraction step table technique. Visipaque dye was used for the entirety of the case. Retrograde aortic pressure was  monitored during the case.  HEMODYNAMICS:    AO SYSTOLIC/AO DIASTOLIC: [id number]   Angiographic Data:   1: Abdominal-the renal artery is widely patent. The infrarenal abdominal aorta and iliac bifurcation was free of significant atherosclerotic changes.  2: Lower extremity-there was a 70% eccentric left common femoral artery stenosis. The left SFA was occluded at its origin reconstituted in the abductor canal. There was a 75-80% segmental popliteal stenosis in the P2 segment with one vessel runoff via the anterior tibial artery 75% proximal stenosis.  3: Right lower extremity- there was a 75% focal mid right SFA stenosis followed by a short segment of total occlusion in the distal right SFA and adductor canal. There was a 40% stenosis in the P2 segment of the popliteal artery with 0 vessel runoff.  IMPRESSION:[name] has severe infrainguinal disease notable for a chronic totally occluded left SFA and high-grade disease in the mid and distal right SFA with 0 vessel runoff on the right. She is more symptomatically right side. We will proceed with directional atherectomy using turbo hawk of the right SFA to improve inflow to the geniculate collateral vessels.  Procedure Description:contralateral access was obtained with a 5 [place] crossover catheter, 035 Glidewire, with 35 bursa core wire and a 7 French 45 cm long destination sheath. This received a total of 11,000 units of heparin intravenously with ending ACT of 227. Total contrast administered the patient was 247 cc. The lesion was crossed with an 018 CX I. endhole catheter along with a 014 Rigali a wire. I then exchanged for 014 300 cm long [place] core wire. I performed directional atherectomy with a LCM Turbo hawk calcium cutter.I removed a copious amount of  atheromatous plaque. The final angiographic result reduction of a 75% mid right SFA stenosis and a short segment CTO in the distal right SFA to less than 20% residual without dissection or  perforation. There was that her collateral flow to the infrapopliteal vessels at the end of the case. The sheath was then withdrawn across the bifurcation and exchanged over an 035 wire for a short 7 [place] sheath. The patient received 300 mg of by mouth Plavix.  Final Impression: successful turbo hawk directional arthrectomy of a diffusely diseased right SFA with 0 vessel runoff for lifestyle limiting claudication. The patient was treated with aspirin Plavix. She'll be hydrated overnight and discharged home in the morning. Will get followup lower extremity or to Doppler studies after which she'll see me back. She is a candidate for staged left SFA intervention along chronic total occlusion.    Karla Harp MD, [facility] 09/10/2013 12:52 PM

## 2013-09-10 NOTE — Interval H&P Note (Signed)
History and Physical Interval Note:  09/10/2013 11:10 AM  [name]  has presented today for surgery, with the diagnosis of Claudication  The various methods of treatment have been discussed with the patient and family. After consideration of risks, benefits and other options for treatment, the patient has consented to  Procedure(s): LOWER EXTREMITY ANGIOGRAM (N/A) as a surgical intervention .  The patient's history has been reviewed, patient examined, no change in status, stable for surgery.  I have reviewed the patient's chart and labs.  Questions were answered to the patient's satisfaction.     [name]

## 2013-09-10 NOTE — H&P (View-Only) (Signed)
08/20/2013 [name]   Sep 19, 1961  [id number]  Primary Physician Angelica Chessman, MD Primary Cardiologist: [name] MD Renae Gloss   HPI:  Ms. Newitt is a 52 year old moderately overweight single Caucasian female mother of one child who lives with her sister. She was referred by Dr. Franki Monte at [facility] for peripheral vascular evaluation because of critical limb ischemia. She saw Dr. Jenkins Rouge at [facility]  for evaluation of cardiomyopathy and proximal atrial fibrillation.risk factors include a 20-pack-year history of tobacco abuse, treated diabetes, and hypertension as well as family history of heart disease. She's never had a heart attack or stroke. She does have COPD. She had paroxysmal atrial fibrillation in the past and has undergone cardioversion in [place].. She is currently in sinus rhythm on Coumadin anticoagulation. A 2-D echo was ordered by Dr. Nolon Lennert. The lower [place] Doppler studies performed in our office suggested critical limb ischemia with an occluded SFA and popliteal arteries bilaterally with tibial vessel disease as well. She complains of lifestyle limiting claudication.   Current Outpatient Prescriptions  Medication Sig Dispense Refill  . aspirin EC 81 MG tablet Take 1 tablet (81 mg total) by mouth daily.  120 tablet  3  . carvedilol (COREG) 6.25 MG tablet Take 1 tablet (6.25 mg total) by mouth 2 (two) times daily with a meal.  60 tablet  3  . furosemide (LASIX) 40 MG tablet Take 1 tablet (40 mg total) by mouth daily.  30 tablet  3  . glipiZIDE (GLUCOTROL XL) 10 MG 24 hr tablet Take 1 tablet (10 mg total) by mouth daily with breakfast.  30 tablet  3  . levothyroxine (SYNTHROID, LEVOTHROID) 50 MCG tablet Take 1 tablet (50 mcg total) by mouth daily.  120 tablet  3  . lisinopril (PRINIVIL,ZESTRIL) 10 MG tablet Take 10 mg by mouth daily.      . magnesium oxide (MAG-OX) 400 MG tablet Take 1 tablet (400 mg total) by mouth  daily.  30 tablet  1  . metFORMIN (GLUCOPHAGE) 1000 MG tablet Take 1 tablet (1,000 mg total) by mouth 2 (two) times daily with a meal.  60 tablet  3  . silver sulfADIAZINE (SILVADENE) 1 % cream Apply topically daily. Apply to foot  50 g  0  . warfarin (COUMADIN) 5 MG tablet Take 1 tablet (5 mg total) by mouth daily.  30 tablet  3   No current facility-administered medications for this visit.    No Known Allergies  History   Social History  . Marital Status: Divorced    Spouse Name: N/A    Number of Children: N/A  . Years of Education: N/A   Occupational History  . disabled    Social History Main Topics  . Smoking status: Current Every Day Smoker -- 1.00 packs/day    Types: Cigarettes  . Smokeless tobacco: Never Used  . Alcohol Use: No     Comment: has not used in 6 or 7 months  . Drug Use: Yes    Special: "Crack" cocaine, Marijuana     Comment: patient states has not used in 3 months  . Sexual Activity: Not Currently   Other Topics Concern  . Not on file   Social History Narrative  . No narrative on file     Review of Systems: General: negative for chills, fever, night sweats or weight changes.  Cardiovascular: negative for chest pain, dyspnea on exertion, edema, orthopnea, palpitations, paroxysmal nocturnal dyspnea  or shortness of breath Dermatological: negative for rash Respiratory: negative for cough or wheezing Urologic: negative for hematuria Abdominal: negative for nausea, vomiting, diarrhea, bright red blood per rectum, melena, or hematemesis Neurologic: negative for visual changes, syncope, or dizziness All other systems reviewed and are otherwise negative except as noted above.    Blood pressure 130/78, pulse 81, height 5\' 4"  (1.626 m), weight 180 lb 12.8 oz (82.01 kg), last menstrual period 11/27/2012.  General appearance: alert and no distress Neck: no adenopathy, no carotid bruit, no JVD, supple, symmetrical, trachea midline and thyroid not enlarged,  symmetric, no tenderness/mass/nodules Lungs: clear to auscultation bilaterally Heart: regular rate and rhythm, S1, S2 normal, no murmur, click, rub or gallop Abdomen: soft, non-tender; bowel sounds normal; no masses,  no organomegaly Extremities: extremities normal, atraumatic, no cyanosis or edema and absent pedal pulses with nonhealing right heel ulcers. Pulses: 2+ and symmetric absent pulses  EKG normal sinus rhythm at 81 without ST or T wave changes  ASSESSMENT AND PLAN:   Atherosclerotic peripheral vascular disease with ulceration Patient was referred by Dr. Kendell Bane from [place] for nonhealing right heel ulcer. She also has bilateral lower extremity lifestyle limiting claudication.lower extremities extremity arterial Dopplers were performed 08/13/13 revealing an occluded distal right SFA and popliteal artery with occluded tibial vessels as well. Her left ABI was 0.44 with similar anatomy. Based on this, her symptoms and her nonhealing ulcer she will need angiography and potential percutaneous intervention for critical limb ischemia.  Atrial fibrillation History of DC cardioversion [place] several times on Coumadin anticoagulation since that time. She is currently in sinus rhythm.      [name] MD FACP,FACC,FAHA, [facility] 08/20/2013 11:16 AM

## 2013-09-11 ENCOUNTER — Other Ambulatory Visit: Payer: Self-pay | Admitting: Physician Assistant

## 2013-09-11 DIAGNOSIS — I428 Other cardiomyopathies: Secondary | ICD-10-CM

## 2013-09-11 DIAGNOSIS — I4891 Unspecified atrial fibrillation: Secondary | ICD-10-CM

## 2013-09-11 DIAGNOSIS — Z9862 Peripheral vascular angioplasty status: Secondary | ICD-10-CM

## 2013-09-11 DIAGNOSIS — I739 Peripheral vascular disease, unspecified: Secondary | ICD-10-CM

## 2013-09-11 DIAGNOSIS — L98499 Non-pressure chronic ulcer of skin of other sites with unspecified severity: Secondary | ICD-10-CM

## 2013-09-11 DIAGNOSIS — E1159 Type 2 diabetes mellitus with other circulatory complications: Secondary | ICD-10-CM

## 2013-09-11 DIAGNOSIS — Z7901 Long term (current) use of anticoagulants: Secondary | ICD-10-CM

## 2013-09-11 DIAGNOSIS — I798 Other disorders of arteries, arterioles and capillaries in diseases classified elsewhere: Secondary | ICD-10-CM

## 2013-09-11 DIAGNOSIS — M79609 Pain in unspecified limb: Secondary | ICD-10-CM

## 2013-09-11 LAB — BASIC METABOLIC PANEL
BUN: 21 mg/dL (ref 6–23)
Chloride: 101 mEq/L (ref 96–112)
GFR calc Af Amer: >90 mL/min (ref >90)
GFR calc non Af Amer: >90 mL/min (ref >90)
Potassium: 4.8 mEq/L (ref 3.5–5.1)
Sodium: 133 mEq/L — ABNORMAL LOW (ref 135–145)

## 2013-09-11 LAB — CBC
HCT: 28.9 % — ABNORMAL LOW (ref 36.0–46.0)
Hemoglobin: 9.9 g/dL — ABNORMAL LOW (ref 12.0–15.0)
MCHC: 34.3 g/dL (ref 30.0–36.0)
MCV: 91.5 fL (ref 78.0–100.0)
RBC: 3.16 MIL/uL — ABNORMAL LOW (ref 3.87–5.11)
WBC: 11.7 10*3/uL — ABNORMAL HIGH (ref 4.0–10.5)

## 2013-09-11 MED ORDER — CLOPIDOGREL BISULFATE 75 MG PO TABS: 75.0000 mg | ORAL_TABLET | Freq: Every day | ORAL | Status: DC

## 2013-09-11 MED ORDER — PNEUMOCOCCAL VAC POLYVALENT 25 MCG/0.5ML IJ INJ: 0.5000 mL | INJECTION | INTRAMUSCULAR | Status: DC

## 2013-09-11 MED ORDER — METFORMIN HCL 1000 MG PO TABS: 1000.0000 mg | ORAL_TABLET | Freq: Two times a day (BID) | ORAL | Status: DC

## 2013-09-11 NOTE — Progress Notes (Addendum)
Subjective: Feels knots under her right foot.  Objective: Vital signs in last 24 hours: Temp:  [97.7 F (36.5 C)-98.8 F (37.1 C)] 98.4 F (36.9 C) (10/29 0747) Pulse Rate:  [35-108] 103 (10/29 0747) Resp:  [16-20] 18 (10/29 0747) BP: (90-181)/(54-106) 119/72 mmHg (10/29 0747) SpO2:  [95 %-98 %] 97 % (10/29 0747) Weight:  [186 lb 11.7 oz (84.7 kg)] 186 lb 11.7 oz (84.7 kg) (10/29 0007) Last BM Date: 09/10/13  Intake/Output from previous day: 10/28 0701 - 10/29 0700 In: 315 [P.O.:240; I.V.:75] Out: 1725 [Urine:1725] Intake/Output this shift:    Medications Current Facility-Administered Medications  Medication Dose Route Frequency Provider Last Rate Last Dose  . acetaminophen (TYLENOL) tablet 650 mg  650 mg Oral Q4H PRN [name], MD      . aspirin EC tablet 325 mg  325 mg Oral Daily [name], MD      . aspirin EC tablet 81 mg  81 mg Oral Daily [name], MD   81 mg at 09/11/13 0854  . carvedilol (COREG) tablet 6.25 mg  6.25 mg Oral BID WC [name], MD   6.25 mg at 09/11/13 0851  . clopidogrel (PLAVIX) tablet 75 mg  75 mg Oral Q breakfast [name], MD   75 mg at 09/11/13 0853  . furosemide (LASIX) tablet 40 mg  40 mg Oral Daily [name], MD   40 mg at 09/11/13 0854  . glipiZIDE (GLUCOTROL XL) 24 hr tablet 10 mg  10 mg Oral Q breakfast [name], MD      . glipiZIDE (GLUCOTROL XL) 24 hr tablet 10 mg  10 mg Oral Q breakfast [name], MD   10 mg at 09/11/13 0853  . levothyroxine (SYNTHROID, LEVOTHROID) tablet 50 mcg  50 mcg Oral QAC breakfast [name], MD   50 mcg at 09/11/13 [phone]  . lisinopril (PRINIVIL,ZESTRIL) tablet 10 mg  10 mg Oral Daily [name], MD   10 mg at 09/11/13 0903  . magnesium oxide (MAG-OX) tablet 400 mg  400 mg Oral Daily [name], MD   400 mg at 09/10/13 1830  . morphine 2 MG/ML injection 2 mg  2 mg Intravenous Q1H PRN [name], MD   2 mg at 09/11/13 0223  .  ondansetron (ZOFRAN) injection 4 mg  4 mg Intravenous Q6H PRN [name], MD      . Derrill Memo ON 09/12/2013] pneumococcal 23 valent vaccine (PNU-IMMUNE) injection 0.5 mL  0.5 mL Intramuscular Tomorrow-1000 [name], MD        PE: General appearance: alert, cooperative and no distress Lungs: clear to auscultation bilaterally Heart: regular rate and rhythm, S1, S2 normal, no murmur, click, rub or gallop Extremities: No LEE Pulses: 2+ and symmetric no palpable pedal pulses. Feet are warm. Skin: Left groin:  mildly tender.  No hematoma  Neurologic: Grossly normal  Lab Results:   Recent Labs  09/11/13 0355  WBC 11.7*  HGB 9.9*  HCT 28.9*  PLT 268   BMET  Recent Labs  09/11/13 0355  NA 133*  K 4.8  CL 101  CO2 22  GLUCOSE 149*  BUN 21  CREATININE 0.64  CALCIUM 8.7   PT/INR  Recent Labs  09/10/13 0742  LABPROT 14.2  INR 1.12    Assessment/Plan   Active Problems:   S/P peripheral artery angioplasty - TurboHawk atherectomy; R SFA   DM (diabetes mellitus),  type 2 with peripheral vascular complications   Long term (current) use of anticoagulants   Atherosclerotic peripheral vascular disease with ulceration  Plan:  SP successful turbo hawk directional arthrectomy of a diffusely diseased right SFA with 0 vessel runoff for lifestyle limiting claudication.  ASA Plavix.  Follow up LEA dopplers.  Possible stage intervention to left SFA.    LOS: 1 day    HAGER, BRYAN 09/11/2013 9:44 AM  I have seen and evaluated the patient this AM along with Tarri Fuller, PA. I agree with his findings, examination as well as impression recommendations.  Stable s/p Peripheral PTCA/Atherectomy for lifestyle limiting claudication.\ - R SFA  Plan is staged L SFA PTA/Atherectomy - STent  HR is a bit up - may consider increasing BB dose as OP (but will hold off here b/c of documented bradycardia)  Small groin ecchymoses - otherwise stable for d/c home with usual LEA Dopplery  eval prior to f/u. Can reduce ASA to 81 mg. Would need to consider Statin as OP.   Return to home - restart warfarin on d/c.    Leonie Man, M.D., M.S. [facility] [address] [place], [place]  [address]  [phone] Pager # [phone] 09/11/2013 10:10 AM

## 2013-09-11 NOTE — Discharge Summary (Signed)
Physician Discharge Summary  Patient ID: [name] MRN: [id number] DOB/AGE: [birth year] 52 y.o.  Admit date: 09/10/2013 Discharge date: 09/11/2013  Admission Diagnoses:  PAD  Discharge Diagnoses:  Principal Problem:   Atherosclerotic peripheral vascular disease with ulceration Active Problems:   DM (diabetes mellitus), type 2 with peripheral vascular complications   Long term (current) use of anticoagulants   S/P peripheral artery angioplasty - TurboHawk atherectomy; R SFA   Discharged Condition: stable  Hospital Course:   [name] is a 52 year old moderately overweight single Caucasian female mother of one child who lives with her sister. She was referred by Dr. Franki Monte at [facility] for peripheral vascular evaluation because of critical limb ischemia. She saw Dr. Jenkins Rouge at [facility] for evaluation of cardiomyopathy and proximal atrial fibrillation.risk factors include a 20-pack-year history of tobacco abuse, treated diabetes, and hypertension as well as family history of heart disease. She's never had a heart attack or stroke. She does have COPD. She had paroxysmal atrial fibrillation in the past and has undergone cardioversion in [place].. She is currently in sinus rhythm on Coumadin anticoagulation. A 2-D echo was ordered by Dr. Nolon Lennert. The lower arterial Doppler studies performed in our office suggested critical limb ischemia with an occluded SFA and popliteal arteries bilaterally with tibial vessel disease as well. She complains of lifestyle limiting claudication.  She underwent successful turbo hawk directional arthrectomy of a diffusely diseased right SFA with 0 vessel runoff for lifestyle limiting claudication.  The patient was seen by Dr. Ellyn Hack who felt she was stable for DC home.  Follow up LEA dopplers in two weeks and FU with Dr. Gwenlyn Found.  Follow up in coumadin clinic.    Consults: None  Significant Diagnostic Studies:  HEMODYNAMICS:  AO  SYSTOLIC/AO DIASTOLIC: [id number]  Angiographic Data:  1: Abdominal-the renal artery is widely patent. The infrarenal abdominal aorta and iliac bifurcation was free of significant atherosclerotic changes.  2: Lower extremity-there was a 70% eccentric left common femoral artery stenosis. The left SFA was occluded at its origin reconstituted in the abductor canal. There was a 75-80% segmental popliteal stenosis in the P2 segment with one vessel runoff via the anterior tibial artery 75% proximal stenosis.  3: Right lower extremity- there was a 75% focal mid right SFA stenosis followed by a short segment of total occlusion in the distal right SFA and adductor canal. There was a 40% stenosis in the P2 segment of the popliteal artery with 0 vessel runoff.  IMPRESSION:Ms. [name] has severe infrainguinal disease notable for a chronic totally occluded left SFA and high-grade disease in the mid and distal right SFA with 0 vessel runoff on the right. She is more symptomatically right side. We will proceed with directional atherectomy using turbo hawk of the right SFA to improve inflow to the geniculate collateral vessels.  Procedure Description:contralateral access was obtained with a 5 [place] crossover catheter, 035 Glidewire, with 35 bursa core wire and a 7 French 45 cm long destination sheath. This received a total of 11,000 units of heparin intravenously with ending ACT of 227. Total contrast administered the patient was 247 cc. The lesion was crossed with an 018 CX I. endhole catheter along with a 014 Rigali a wire. I then exchanged for 014 300 cm long [place] core wire. I performed directional atherectomy with a LCM Turbo hawk calcium cutter.I removed a copious amount of atheromatous plaque. The final angiographic result reduction of a 75% mid right SFA stenosis and a  short segment CTO in the distal right SFA to less than 20% residual without dissection or perforation. There was that her collateral flow to the  infrapopliteal vessels at the end of the case. The sheath was then withdrawn across the bifurcation and exchanged over an 035 wire for a short 7 [place] sheath. The patient received 300 mg of by mouth Plavix.  Final Impression: successful turbo hawk directional arthrectomy of a diffusely diseased right SFA with 0 vessel runoff for lifestyle limiting claudication. The patient was treated with aspirin Plavix. She'll be hydrated overnight and discharged home in the morning. Will get followup lower extremity or to Doppler studies after which she'll see me back. She is a candidate for staged left SFA intervention along chronic total occlusion.  [name] MD, [facility]  09/10/2013  Treatments: See above  Discharge Exam: Blood pressure 119/72, pulse 103, temperature 98.4 F (36.9 C), temperature source Oral, resp. rate 18, height 5\' 4"  (1.626 m), weight 186 lb 11.7 oz (84.7 kg), last menstrual period 11/27/2012, SpO2 97.00%.   Disposition: 01-Home or Self Care      Discharge Orders   Future Orders Complete By Expires   Diet - low sodium heart healthy  As directed    Discharge instructions  As directed    Comments:     No lifting more than a half gallon of milk or driving for three days.   Increase activity slowly  As directed        Medication List         aspirin EC 81 MG tablet  Take 1 tablet (81 mg total) by mouth daily.     carvedilol 6.25 MG tablet  Commonly known as:  COREG  Take 1 tablet (6.25 mg total) by mouth 2 (two) times daily with a meal.     clopidogrel 75 MG tablet  Commonly known as:  PLAVIX  Take 1 tablet (75 mg total) by mouth daily with breakfast.     furosemide 40 MG tablet  Commonly known as:  LASIX  Take 40 mg by mouth daily.     glipiZIDE 10 MG 24 hr tablet  Commonly known as:  GLUCOTROL XL  Take 10 mg by mouth daily with breakfast.     levothyroxine 50 MCG tablet  Commonly known as:  SYNTHROID, LEVOTHROID  Take 1 tablet (50 mcg total) by mouth  daily.     lisinopril 10 MG tablet  Commonly known as:  PRINIVIL,ZESTRIL  Take 10 mg by mouth daily.     magnesium oxide 400 MG tablet  Commonly known as:  MAG-OX  Take 1 tablet (400 mg total) by mouth daily.     metFORMIN 1000 MG tablet  Commonly known as:  GLUCOPHAGE  Take 1 tablet (1,000 mg total) by mouth 2 (two) times daily with a meal.     warfarin 6 MG tablet  Commonly known as:  COUMADIN  Take 6 mg by mouth daily.         SignedTarri Fuller 09/11/2013, 10:28 AM

## 2013-09-11 NOTE — Progress Notes (Signed)
Pt ambulated hall tolerated well Left groin no complications.

## 2013-09-11 NOTE — Discharge Summary (Signed)
Pt. Seen & examined on AM of d/c.   See las PN for details.   Ready for d/c s/p PV Intervention. ROV with Dr. Karlyne Greenspan.  Leonie Man, MD

## 2013-09-12 ENCOUNTER — Telehealth: Payer: Self-pay | Admitting: Cardiovascular Disease

## 2013-09-12 ENCOUNTER — Telehealth: Payer: Self-pay | Admitting: Physician Assistant

## 2013-09-12 NOTE — Telephone Encounter (Signed)
Reviewed chart.  Pt discharged on both Plavix and warfarin.  Returned call and spoke w/ Verdis Frederickson, pharmacist.  Informed pt discharged yesterday on both meds.  Maria verbalized understanding and asked RN to review pt's med list as pt told them she has used another pharmacy recently to fill warfarin.  Wanted to make sure she has updated list.  Med list reviewed and all meds confirmed except Duoneb, which was not listed in chart.  Verdis Frederickson stated she will f/u with pt.  Informed provider will be notified, but pt was discharged on both meds.  Verbalized understanding.  Message forwarded to B. Samara Snide, PA-C

## 2013-09-12 NOTE — Telephone Encounter (Signed)
PT'S  NEPHEW  AWARE OF  TEST RESULTS .Adonis Housekeeper

## 2013-09-12 NOTE — Telephone Encounter (Signed)
New Problem:  Pt's nephew states he is calling on behalf of his aunt to get her recent test results.

## 2013-09-12 NOTE — Telephone Encounter (Signed)
Dr. Gwenlyn Found notified and advised pt may not notice any change just yet and advised she keep f/u as scheduled.    Returned call and informed pt per instructions by MD.  Pt verbalized understanding and agreed w/ plan.  Pt aware scheduling will contact her to set up appt for doppler.

## 2013-09-12 NOTE — Telephone Encounter (Signed)
Message forwarded to Oswaldo Done, PA-C for further instructions.

## 2013-09-12 NOTE — Telephone Encounter (Signed)
Had proc on Tuesday and right side still bothering her.  Please call

## 2013-09-12 NOTE — Telephone Encounter (Signed)
Aaron Edelman hager wrote rx for plavix and she is already on warfarin  Please call to clarify if both these are needed

## 2013-09-12 NOTE — Telephone Encounter (Signed)
Returned call and pt verified x 2.  Pt stated she had a procedure done Tuesday to open the blood flow in her leg.  Stated she is still having pain in her R leg and has a sharp pain in her heel.  Stated her R leg feels numb, like she's walking on pins and needles.  Stated it feels the same as is it did when she left the hospital and denied it worse.  Pt also stated she is back on most of her meds.  Pt informed she may need to have doppler done sooner.  Will discuss w/ provider and call back.  Pt verbalized understanding and agreed w/ plan.

## 2013-09-16 ENCOUNTER — Telehealth: Payer: Self-pay | Admitting: Cardiovascular Disease

## 2013-09-16 NOTE — Telephone Encounter (Signed)
She gets her medication from [facility] and received a note when her medicine was delivered regarding Lisinopril 10 mg  There is some confusion regarding amount she is supposed to take.  Discharge paper from hospital does not agree with what she received,, Please call

## 2013-09-16 NOTE — Telephone Encounter (Signed)
Warfarin and plavix are ok.  She should be seeing Dr. Judithann Sauger soon and he can decide on continuing ASA.  Dreana Britz 11:32 AM

## 2013-09-16 NOTE — Telephone Encounter (Signed)
Returned call and pt verified x 2 w/ Billie, pt's sister.  Unable to give clear information.  Advised call Joseph's cell (nephew).  Call to Joseph's cell and verified pt x 2.  Pt in background as well.  Stated when he called about pt's refills, the pharmacist said they received a message that lisinopril was increased to a whole pill (10 mg) daily.  Need a script if dose increased.  Stated pt has been taking 1/2 tab (5 mg) only if BP> 100.  Stated if bottom number is >100 pt is supposed to take lisinopril.  Broadus John informed bottom number should be below 100 and her instructions are likely for the top number.    RN asked nephew when was last time pt took lisinopril and he stated pt is not sure, but he thinks it was at least 3 days ago.  Asked them to check BP now and BP 124/71.  Advised pt should take lisinopril tonight at dose she has been taking, 1/2 tab (5mg ) until further notice.  Advised they keep a record of BPs for the next couple of nights and call back on Wed/Thurs with update on BP.  Advised HOLD lisinopril if top number BP is <100 and call office if not sure, even after hours.  Nephew verbalized understanding and agreed w/ plan.  Will call back on Wed w/ update on BPs.  Message forwarded to Curt Bears, RN per Dr. Gwenlyn Found.

## 2013-09-18 ENCOUNTER — Telehealth: Payer: Self-pay | Admitting: Cardiovascular Disease

## 2013-09-18 NOTE — Telephone Encounter (Signed)
Spoke with [name] and she wanted to confirm the patient is to be taking both warfarin and clopidogrel. Informed her per patient's hospital discharge summary that she should be taking both of these medications. Hospital discharge faxed to [facility] for confirmation.

## 2013-09-19 ENCOUNTER — Telehealth: Payer: Self-pay | Admitting: Cardiovascular Disease

## 2013-09-19 ENCOUNTER — Telehealth (HOSPITAL_COMMUNITY): Payer: Self-pay | Admitting: *Deleted

## 2013-09-19 NOTE — Telephone Encounter (Signed)
Please call-concerning her blood pressure readings.

## 2013-09-19 NOTE — Telephone Encounter (Signed)
Returned call and pt verified x 2.  Pt gave BP readings over the past 3 nights: 147/90 (Monday), 146/96 (Tuesday), 145/92 (Wednesday).  Pt stated she has been taking 1/2 lisinopril.  Stated her directions say to take 1/2 pill if BP less than 100 and a whole pill if BP greater than 100.  Pt informed based on the instructions she stated she has, she should be taking lisinopril 10 mg once daily.  Pt advised to take whole tab tonight and check BP in AM to see if lower.  Pt verbalized understanding and agreed w/ plan.  Pt also c/o tightness in R foot and still having the sharp pains and feeling like pins and needles in R heel.  Pt informed scheduler tried to contact her today to set up appt and was not able to.  Pt informed she will need to schedule doppler as soon as possible and f/u with PA/NP for evaluation since symptoms persisting.  Pt denied change in color and symptoms not new.  Also stated she has been tested for arthritis and is supposed to find out next week if she has it.  Ebony in scheduling notified and will set up appts.  Doppler scheduled for 11.14 at 11am.  No Extender appt scheduled.  Call to pt and informed.  Offered appt for evaluation and pt declined.  Stated she will wait until after test.  Pt stated her sister needed to talk about the appt that was just scheduled.  Sister on phone and stated she has an appt on the same day at 11:30am and will need to reschedule the test.  Ebony notified and rescheduled appt for 11.12 at 12:30pm.

## 2013-09-25 ENCOUNTER — Other Ambulatory Visit: Payer: Self-pay | Admitting: *Deleted

## 2013-09-25 ENCOUNTER — Other Ambulatory Visit (HOSPITAL_COMMUNITY): Payer: Self-pay | Admitting: Cardiovascular Disease

## 2013-09-25 ENCOUNTER — Ambulatory Visit (HOSPITAL_COMMUNITY)
Admission: RE | Admit: 2013-09-25 | Discharge: 2013-09-25 | Disposition: A | Discharge status: Home / Self Care | Payer: Medicaid Other | Source: Ambulatory Visit | Attending: Cardiology | Admitting: Cardiology

## 2013-09-25 ENCOUNTER — Telehealth: Payer: Self-pay | Admitting: Cardiovascular Disease

## 2013-09-25 DIAGNOSIS — Z9862 Peripheral vascular angioplasty status: Secondary | ICD-10-CM

## 2013-09-25 DIAGNOSIS — I739 Peripheral vascular disease, unspecified: Secondary | ICD-10-CM | POA: Insufficient documentation

## 2013-09-25 NOTE — Telephone Encounter (Signed)
I spoke with the patient's nephew [name], to notify that we have not obtained the prior authorization for [name]'s LEA. I suggested that we cancel their appointment until we have obtained authorization. They stated that they would prefer to keep their appointment. I informed the patient that they can keep their appointment and we will continue to try and get this authorized with a Peer Review but there is a chance that Medicaid will still deny the test, ie. they will not cover it. The patient stated that they would still like to keep their appointment. I informed the patient that we will keep their appointment for today and that I will contact them regarding the final determination by Medicaid.

## 2013-09-25 NOTE — Progress Notes (Signed)
Lower Extremity Arterial Duplex Completed. °Brianna L Mazza,RVT °

## 2013-09-27 ENCOUNTER — Ambulatory Visit (HOSPITAL_COMMUNITY)
Admission: RE | Admit: 2013-09-27 | Discharge: 2013-09-27 | Disposition: A | Discharge status: Home / Self Care | Payer: Medicaid Other | Source: Ambulatory Visit | Attending: Nurse Practitioner | Admitting: Nurse Practitioner

## 2013-09-27 ENCOUNTER — Other Ambulatory Visit (HOSPITAL_COMMUNITY): Payer: Self-pay | Admitting: Nurse Practitioner

## 2013-09-27 ENCOUNTER — Encounter (HOSPITAL_COMMUNITY): Payer: Medicaid Other

## 2013-09-27 DIAGNOSIS — R062 Wheezing: Secondary | ICD-10-CM

## 2013-09-27 DIAGNOSIS — R059 Cough, unspecified: Secondary | ICD-10-CM | POA: Insufficient documentation

## 2013-09-27 DIAGNOSIS — R05 Cough: Secondary | ICD-10-CM

## 2013-09-27 DIAGNOSIS — F17200 Nicotine dependence, unspecified, uncomplicated: Secondary | ICD-10-CM | POA: Insufficient documentation

## 2013-09-27 NOTE — Telephone Encounter (Signed)
Per Peer Review with Dr. Gwenlyn Found- Patient [name]'s unilateral LEA was approved [id number] good from 09/24/13-10/24/13. Patient notified on 09/27/13

## 2013-10-03 ENCOUNTER — Encounter: Payer: Self-pay | Admitting: Cardiovascular Disease

## 2013-10-03 ENCOUNTER — Ambulatory Visit (INDEPENDENT_AMBULATORY_CARE_PROVIDER_SITE_OTHER): Payer: Medicaid Other | Admitting: Cardiovascular Disease

## 2013-10-03 VITALS — BP 139/69 | HR 100 | Ht 64.0 in | Wt 191.7 lb

## 2013-10-03 DIAGNOSIS — Z9889 Other specified postprocedural states: Secondary | ICD-10-CM

## 2013-10-03 DIAGNOSIS — Z9862 Peripheral vascular angioplasty status: Secondary | ICD-10-CM

## 2013-10-03 DIAGNOSIS — I739 Peripheral vascular disease, unspecified: Secondary | ICD-10-CM

## 2013-10-03 NOTE — Assessment & Plan Note (Signed)
History of TurboHawk atherectomy of right SFA 09/10/13 with two-vessel runoff for critical limb ischemia in a properly healing ulcer on the right heel.her followup lower extremity arterial Doppler study performed 09/25/13 revealed a right ABI of 0.5 to with a patent right SFA which had been occluded at prior Doppler 08/13/13. She says that her right leg does feel better as she does have some sharp pains in her right foot which may be related to her diabetic peripheral neuropathy. Despite the fact that her left leg reveals an occluded left SFA she is completely symptomatic on that side.

## 2013-10-03 NOTE — Progress Notes (Signed)
10/03/2013 LAKELA HELMLINGER   27-Aug-1961  [id number]  Primary Physician Angelica Chessman, MD Primary Cardiologist: [name] MD Renae Gloss   HPI:  Ms. Sheldon is a 52 year old moderately overweight single Caucasian female mother of one child who lives with her sister. She was referred by Dr. Franki Monte at [facility] for peripheral vascular evaluation because of critical limb ischemia. She saw Dr. Jenkins Rouge at [facility] for evaluation of cardiomyopathy and proximal atrial fibrillation.risk factors include a 20-pack-year history of tobacco abuse, treated diabetes, and hypertension as well as family history of heart disease. She's never had a heart attack or stroke. She does have COPD. She had paroxysmal atrial fibrillation in the past and has undergone cardioversion in [place].. She is currently in sinus rhythm on Coumadin anticoagulation. A 2-D echo was ordered by Dr. Nolon Lennert. The [place] Doppler studies performed in our office suggested critical limb ischemia with an occluded SFA and popliteal arteries bilaterally with tibial vessel disease as well. She complains of lifestyle limiting claudication. I performed cholangiography on 09/10/13 revealing occluded SFAs bilaterally with chronic occluded tibial vessels as well. I performed TurboHawk  directional atherectomy of her entire right SFA with an excellent angiographic result removing a copious amount of atherosclerotic plaque. The ulcer on her right heel is slowly improving. Her symptoms have improved as well.    Current Outpatient Prescriptions  Medication Sig Dispense Refill  . albuterol (PROVENTIL HFA;VENTOLIN HFA) 108 (90 BASE) MCG/ACT inhaler Inhale 1-2 puffs into the lungs every 6 (six) hours as needed for wheezing or shortness of breath.      [name] aspirin EC 81 MG tablet Take 1 tablet (81 mg total) by mouth daily.  120 tablet  3  . carvedilol (COREG) 6.25 MG tablet Take 1 tablet (6.25 mg total) by  mouth 2 (two) times daily with a meal.  60 tablet  3  . clopidogrel (PLAVIX) 75 MG tablet Take 1 tablet (75 mg total) by mouth daily with breakfast.  30 tablet  11  . furosemide (LASIX) 40 MG tablet Take 40 mg by mouth daily.      [name] glipiZIDE (GLUCOTROL XL) 10 MG 24 hr tablet Take 10 mg by mouth daily with breakfast.      . levothyroxine (SYNTHROID, LEVOTHROID) 50 MCG tablet Take 1 tablet (50 mcg total) by mouth daily.  120 tablet  3  . lisinopril (PRINIVIL,ZESTRIL) 10 MG tablet Take 10 mg by mouth daily.      . magnesium oxide (MAG-OX) 400 MG tablet Take 1 tablet (400 mg total) by mouth daily.  30 tablet  1  . metFORMIN (GLUCOPHAGE) 1000 MG tablet Take 1 tablet (1,000 mg total) by mouth 2 (two) times daily with a meal.  60 tablet  3  . pentoxifylline (TRENTAL) 400 MG CR tablet Take 400 mg by mouth 2 (two) times daily.      . Rivaroxaban (XARELTO) 20 MG TABS tablet Take 20 mg by mouth daily with supper.      . varenicline (CHANTIX) 0.5 MG tablet Take 0.5 mg by mouth daily.       No current facility-administered medications for this visit.    No Known Allergies  History   Social History  . Marital Status: Single    Spouse Name: N/A    Number of Children: N/A  . Years of Education: N/A   Occupational History  . disabled    Social History Main Topics  . Smoking status:  Current Every Day Smoker -- 1.50 packs/day for 38 years    Types: Cigarettes  . Smokeless tobacco: Never Used  . Alcohol Use: 0.6 oz/week    1 Cans of beer per week     Comment: 09/10/2013 "might drink 1 beer/wk; never had problem w/it"  . Drug Use: Yes    Special: "Crack" cocaine, Marijuana     Comment: 09/10/2013 "last marijuana ~ 09/08/2013; last crack was ~ 7 months ago"  . Sexual Activity: Not Currently   Other Topics Concern  . Not on file   Social History Narrative  . No narrative on file     Review of Systems: General: negative for chills, fever, night sweats or weight changes.  Cardiovascular:  negative for chest pain, dyspnea on exertion, edema, orthopnea, palpitations, paroxysmal nocturnal dyspnea or shortness of breath Dermatological: negative for rash Respiratory: negative for cough or wheezing Urologic: negative for hematuria Abdominal: negative for nausea, vomiting, diarrhea, bright red blood per rectum, melena, or hematemesis Neurologic: negative for visual changes, syncope, or dizziness All other systems reviewed and are otherwise negative except as noted above.    Blood pressure 139/69, pulse 100, height 5\' 4"  (1.626 m), weight 191 lb 11.2 oz (86.955 kg), last menstrual period 11/27/2012.  General appearance: alert and no distress Neck: no adenopathy, no carotid bruit, no JVD, supple, symmetrical, trachea midline and thyroid not enlarged, symmetric, no tenderness/mass/nodules Lungs: clear to auscultation bilaterally Heart: regular rate and rhythm, S1, S2 normal, no murmur, click, rub or gallop Extremities: extremities normal, atraumatic, no cyanosis or edema  EKG not performed today  ASSESSMENT AND PLAN:   S/P peripheral artery angioplasty - TurboHawk atherectomy; R SFA History of TurboHawk atherectomy of right SFA 09/10/13 with two-vessel runoff for critical limb ischemia in a properly healing ulcer on the right heel.her followup lower extremity arterial Doppler study performed 09/25/13 revealed a right ABI of 0.5 to with a patent right SFA which had been occluded at prior Doppler 08/13/13. She says that her right leg does feel better as she does have some sharp pains in her right foot which may be related to her diabetic peripheral neuropathy. Despite the fact that her left leg reveals an occluded left SFA she is completely symptomatic on that side.      [name] MD FACP,FACC,FAHA, [facility] 10/03/2013 10:32 AM

## 2013-10-03 NOTE — Patient Instructions (Signed)
  We will see you back in follow up in 3 months with Dr Gwenlyn Found  Dr Gwenlyn Found has ordered lower extremity arterial dopplers to be done in 6 months

## 2013-10-07 ENCOUNTER — Ambulatory Visit: Payer: Self-pay | Admitting: Internal Medicine

## 2014-02-01 ENCOUNTER — Emergency Department (HOSPITAL_COMMUNITY): Payer: Medicaid Other

## 2014-02-01 ENCOUNTER — Encounter (HOSPITAL_COMMUNITY): Payer: Self-pay | Admitting: Emergency Medicine

## 2014-02-01 ENCOUNTER — Inpatient Hospital Stay (HOSPITAL_COMMUNITY)
Admission: EM | Admit: 2014-02-01 | Discharge: 2014-02-05 | DRG: 189 | Disposition: A | Discharge status: Home / Self Care | Payer: Medicaid Other | Attending: Internal Medicine | Admitting: Internal Medicine

## 2014-02-01 DIAGNOSIS — I70209 Unspecified atherosclerosis of native arteries of extremities, unspecified extremity: Secondary | ICD-10-CM

## 2014-02-01 DIAGNOSIS — F17200 Nicotine dependence, unspecified, uncomplicated: Secondary | ICD-10-CM | POA: Diagnosis present

## 2014-02-01 DIAGNOSIS — I4891 Unspecified atrial fibrillation: Secondary | ICD-10-CM | POA: Diagnosis present

## 2014-02-01 DIAGNOSIS — K5900 Constipation, unspecified: Secondary | ICD-10-CM | POA: Diagnosis present

## 2014-02-01 DIAGNOSIS — IMO0002 Reserved for concepts with insufficient information to code with codable children: Secondary | ICD-10-CM

## 2014-02-01 DIAGNOSIS — F1210 Cannabis abuse, uncomplicated: Secondary | ICD-10-CM

## 2014-02-01 DIAGNOSIS — L98499 Non-pressure chronic ulcer of skin of other sites with unspecified severity: Secondary | ICD-10-CM | POA: Diagnosis present

## 2014-02-01 DIAGNOSIS — E669 Obesity, unspecified: Secondary | ICD-10-CM | POA: Diagnosis present

## 2014-02-01 DIAGNOSIS — Z8041 Family history of malignant neoplasm of ovary: Secondary | ICD-10-CM

## 2014-02-01 DIAGNOSIS — Z7982 Long term (current) use of aspirin: Secondary | ICD-10-CM

## 2014-02-01 DIAGNOSIS — E1149 Type 2 diabetes mellitus with other diabetic neurological complication: Secondary | ICD-10-CM | POA: Diagnosis present

## 2014-02-01 DIAGNOSIS — G4700 Insomnia, unspecified: Secondary | ICD-10-CM | POA: Diagnosis present

## 2014-02-01 DIAGNOSIS — L0390 Cellulitis, unspecified: Secondary | ICD-10-CM

## 2014-02-01 DIAGNOSIS — M79605 Pain in left leg: Secondary | ICD-10-CM

## 2014-02-01 DIAGNOSIS — I739 Peripheral vascular disease, unspecified: Secondary | ICD-10-CM | POA: Diagnosis present

## 2014-02-01 DIAGNOSIS — Z833 Family history of diabetes mellitus: Secondary | ICD-10-CM

## 2014-02-01 DIAGNOSIS — Z720 Tobacco use: Secondary | ICD-10-CM

## 2014-02-01 DIAGNOSIS — J189 Pneumonia, unspecified organism: Secondary | ICD-10-CM

## 2014-02-01 DIAGNOSIS — E1165 Type 2 diabetes mellitus with hyperglycemia: Secondary | ICD-10-CM

## 2014-02-01 DIAGNOSIS — E1142 Type 2 diabetes mellitus with diabetic polyneuropathy: Secondary | ICD-10-CM | POA: Diagnosis present

## 2014-02-01 DIAGNOSIS — K7030 Alcoholic cirrhosis of liver without ascites: Secondary | ICD-10-CM | POA: Diagnosis present

## 2014-02-01 DIAGNOSIS — M79604 Pain in right leg: Secondary | ICD-10-CM

## 2014-02-01 DIAGNOSIS — I428 Other cardiomyopathies: Secondary | ICD-10-CM | POA: Diagnosis present

## 2014-02-01 DIAGNOSIS — Z79899 Other long term (current) drug therapy: Secondary | ICD-10-CM

## 2014-02-01 DIAGNOSIS — R0609 Other forms of dyspnea: Secondary | ICD-10-CM | POA: Diagnosis present

## 2014-02-01 DIAGNOSIS — Z7902 Long term (current) use of antithrombotics/antiplatelets: Secondary | ICD-10-CM

## 2014-02-01 DIAGNOSIS — J9600 Acute respiratory failure, unspecified whether with hypoxia or hypercapnia: Principal | ICD-10-CM | POA: Diagnosis present

## 2014-02-01 DIAGNOSIS — Z803 Family history of malignant neoplasm of breast: Secondary | ICD-10-CM

## 2014-02-01 DIAGNOSIS — E1151 Type 2 diabetes mellitus with diabetic peripheral angiopathy without gangrene: Secondary | ICD-10-CM

## 2014-02-01 DIAGNOSIS — F1010 Alcohol abuse, uncomplicated: Secondary | ICD-10-CM

## 2014-02-01 DIAGNOSIS — J441 Chronic obstructive pulmonary disease with (acute) exacerbation: Secondary | ICD-10-CM | POA: Diagnosis present

## 2014-02-01 DIAGNOSIS — F1110 Opioid abuse, uncomplicated: Secondary | ICD-10-CM

## 2014-02-01 DIAGNOSIS — J13 Pneumonia due to Streptococcus pneumoniae: Secondary | ICD-10-CM | POA: Diagnosis present

## 2014-02-01 DIAGNOSIS — E039 Hypothyroidism, unspecified: Secondary | ICD-10-CM | POA: Diagnosis present

## 2014-02-01 DIAGNOSIS — Z9862 Peripheral vascular angioplasty status: Secondary | ICD-10-CM

## 2014-02-01 DIAGNOSIS — J449 Chronic obstructive pulmonary disease, unspecified: Secondary | ICD-10-CM

## 2014-02-01 DIAGNOSIS — Z794 Long term (current) use of insulin: Secondary | ICD-10-CM

## 2014-02-01 DIAGNOSIS — F1410 Cocaine abuse, uncomplicated: Secondary | ICD-10-CM | POA: Diagnosis present

## 2014-02-01 DIAGNOSIS — K219 Gastro-esophageal reflux disease without esophagitis: Secondary | ICD-10-CM | POA: Diagnosis present

## 2014-02-01 DIAGNOSIS — Z8249 Family history of ischemic heart disease and other diseases of the circulatory system: Secondary | ICD-10-CM

## 2014-02-01 DIAGNOSIS — F1020 Alcohol dependence, uncomplicated: Secondary | ICD-10-CM | POA: Diagnosis present

## 2014-02-01 DIAGNOSIS — E1140 Type 2 diabetes mellitus with diabetic neuropathy, unspecified: Secondary | ICD-10-CM

## 2014-02-01 DIAGNOSIS — I10 Essential (primary) hypertension: Secondary | ICD-10-CM | POA: Diagnosis present

## 2014-02-01 DIAGNOSIS — Z7901 Long term (current) use of anticoagulants: Secondary | ICD-10-CM

## 2014-02-01 DIAGNOSIS — F1411 Cocaine abuse, in remission: Secondary | ICD-10-CM

## 2014-02-01 DIAGNOSIS — Z800 Family history of malignant neoplasm of digestive organs: Secondary | ICD-10-CM

## 2014-02-01 DIAGNOSIS — I429 Cardiomyopathy, unspecified: Secondary | ICD-10-CM

## 2014-02-01 LAB — BASIC METABOLIC PANEL
BUN: 15 mg/dL (ref 6–23)
CO2: 25 mEq/L (ref 19–32)
CREATININE: 0.71 mg/dL (ref 0.50–1.10)
Calcium: 9.3 mg/dL (ref 8.4–10.5)
Chloride: 95 mEq/L — ABNORMAL LOW (ref 96–112)
GFR calc non Af Amer: >90 mL/min (ref >90)
Glucose, Bld: 204 mg/dL — ABNORMAL HIGH (ref 70–99)
POTASSIUM: 4.2 meq/L (ref 3.7–5.3)
SODIUM: 138 meq/L (ref 137–147)

## 2014-02-01 LAB — PHOSPHORUS: PHOSPHORUS: 3.9 mg/dL (ref 2.3–4.6)

## 2014-02-01 LAB — CBC
HCT: 36.4 % (ref 36.0–46.0)
Hemoglobin: 12.4 g/dL (ref 12.0–15.0)
MCH: 30.2 pg (ref 26.0–34.0)
MCHC: 34.1 g/dL (ref 30.0–36.0)
MCV: 88.8 fL (ref 78.0–100.0)
PLATELETS: 302 10*3/uL (ref 150–400)
RBC: 4.1 MIL/uL (ref 3.87–5.11)
RDW: 13.7 % (ref 11.5–15.5)
WBC: 10.7 10*3/uL — ABNORMAL HIGH (ref 4.0–10.5)

## 2014-02-01 LAB — I-STAT TROPONIN, ED: Troponin i, poc: 0.03 ng/mL (ref 0.00–0.08)

## 2014-02-01 LAB — MAGNESIUM: Magnesium: 1.5 mg/dL (ref 1.5–2.5)

## 2014-02-01 LAB — STREP PNEUMONIAE URINARY ANTIGEN: Strep Pneumo Urinary Antigen: POSITIVE — AB

## 2014-02-01 LAB — PRO B NATRIURETIC PEPTIDE: Pro B Natriuretic peptide (BNP): 1615 pg/mL — ABNORMAL HIGH (ref 0–125)

## 2014-02-01 LAB — GLUCOSE, CAPILLARY: GLUCOSE-CAPILLARY: 347 mg/dL — AB (ref 70–99)

## 2014-02-01 MED ORDER — IPRATROPIUM BROMIDE 0.02 % IN SOLN: 0.5000 mg | RESPIRATORY_TRACT | Status: DC | PRN

## 2014-02-01 MED ORDER — OXYCODONE HCL 5 MG PO TABS
5.0000 mg | ORAL_TABLET | Freq: Four times a day (QID) | ORAL | Status: DC | PRN
  Administered 2014-02-01 – 2014-02-04 (×6): 5 mg via ORAL
  Filled 2014-02-01 (×6): qty 1

## 2014-02-01 MED ORDER — CARVEDILOL 6.25 MG PO TABS
6.2500 mg | ORAL_TABLET | Freq: Two times a day (BID) | ORAL | Status: DC
  Administered 2014-02-01 – 2014-02-05 (×8): 6.25 mg via ORAL
  Filled 2014-02-01 (×10): qty 1

## 2014-02-01 MED ORDER — IPRATROPIUM BROMIDE 0.02 % IN SOLN
0.5000 mg | Freq: Once | RESPIRATORY_TRACT | Status: AC
  Administered 2014-02-01: 0.5 mg via RESPIRATORY_TRACT
  Filled 2014-02-01: qty 2.5

## 2014-02-01 MED ORDER — DEXTROSE 5 % IV SOLN
500.0000 mg | INTRAVENOUS | Status: DC
  Filled 2014-02-01: qty 500

## 2014-02-01 MED ORDER — IPRATROPIUM-ALBUTEROL 0.5-2.5 (3) MG/3ML IN SOLN
3.0000 mL | Freq: Four times a day (QID) | RESPIRATORY_TRACT | Status: DC
  Administered 2014-02-01 – 2014-02-02 (×5): 3 mL via RESPIRATORY_TRACT
  Filled 2014-02-01 (×5): qty 3

## 2014-02-01 MED ORDER — ONDANSETRON HCL 4 MG/2ML IJ SOLN: 4.0000 mg | Freq: Four times a day (QID) | INTRAMUSCULAR | Status: DC | PRN

## 2014-02-01 MED ORDER — IPRATROPIUM BROMIDE 0.02 % IN SOLN: 0.5000 mg | Freq: Four times a day (QID) | RESPIRATORY_TRACT | Status: DC

## 2014-02-01 MED ORDER — FUROSEMIDE 40 MG PO TABS
40.0000 mg | ORAL_TABLET | Freq: Every day | ORAL | Status: DC
  Administered 2014-02-02 – 2014-02-05 (×4): 40 mg via ORAL
  Filled 2014-02-01 (×4): qty 1

## 2014-02-01 MED ORDER — ASPIRIN EC 81 MG PO TBEC
81.0000 mg | DELAYED_RELEASE_TABLET | Freq: Every day | ORAL | Status: DC
  Administered 2014-02-02 – 2014-02-05 (×4): 81 mg via ORAL
  Filled 2014-02-01 (×4): qty 1

## 2014-02-01 MED ORDER — ALBUTEROL SULFATE (2.5 MG/3ML) 0.083% IN NEBU
5.0000 mg | INHALATION_SOLUTION | Freq: Once | RESPIRATORY_TRACT | Status: AC
  Administered 2014-02-01: 5 mg via RESPIRATORY_TRACT
  Filled 2014-02-01: qty 6

## 2014-02-01 MED ORDER — ONDANSETRON HCL 4 MG PO TABS
4.0000 mg | ORAL_TABLET | Freq: Four times a day (QID) | ORAL | Status: DC | PRN
  Administered 2014-02-04: 4 mg via ORAL
  Filled 2014-02-01: qty 1

## 2014-02-01 MED ORDER — RIVAROXABAN 20 MG PO TABS
20.0000 mg | ORAL_TABLET | Freq: Every day | ORAL | Status: DC
  Administered 2014-02-03 – 2014-02-04 (×2): 20 mg via ORAL
  Filled 2014-02-01 (×4): qty 1

## 2014-02-01 MED ORDER — PENTOXIFYLLINE ER 400 MG PO TBCR
400.0000 mg | EXTENDED_RELEASE_TABLET | Freq: Two times a day (BID) | ORAL | Status: DC
  Administered 2014-02-01 – 2014-02-05 (×8): 400 mg via ORAL
  Filled 2014-02-01 (×10): qty 1

## 2014-02-01 MED ORDER — ALBUTEROL SULFATE (2.5 MG/3ML) 0.083% IN NEBU: 5.0000 mg | INHALATION_SOLUTION | RESPIRATORY_TRACT | Status: DC | PRN

## 2014-02-01 MED ORDER — OXYCODONE-ACETAMINOPHEN 5-325 MG PO TABS
1.0000 | ORAL_TABLET | Freq: Once | ORAL | Status: AC
  Administered 2014-02-01: 1 via ORAL
  Filled 2014-02-01: qty 1

## 2014-02-01 MED ORDER — IPRATROPIUM-ALBUTEROL 0.5-2.5 (3) MG/3ML IN SOLN: 3.0000 mL | RESPIRATORY_TRACT | Status: DC | PRN

## 2014-02-01 MED ORDER — AZITHROMYCIN 250 MG PO TABS
500.0000 mg | ORAL_TABLET | Freq: Once | ORAL | Status: AC
  Administered 2014-02-01: 500 mg via ORAL
  Filled 2014-02-01: qty 2

## 2014-02-01 MED ORDER — SODIUM CHLORIDE 0.9 % IJ SOLN
3.0000 mL | Freq: Two times a day (BID) | INTRAMUSCULAR | Status: DC
  Administered 2014-02-01 – 2014-02-03 (×5): 3 mL via INTRAVENOUS

## 2014-02-01 MED ORDER — LEVOTHYROXINE SODIUM 50 MCG PO TABS
50.0000 ug | ORAL_TABLET | Freq: Every day | ORAL | Status: DC
  Administered 2014-02-02 – 2014-02-05 (×4): 50 ug via ORAL
  Filled 2014-02-01 (×5): qty 1

## 2014-02-01 MED ORDER — METHYLPREDNISOLONE SODIUM SUCC 125 MG IJ SOLR
125.0000 mg | Freq: Once | INTRAMUSCULAR | Status: AC
  Administered 2014-02-01: 125 mg via INTRAVENOUS
  Filled 2014-02-01: qty 2

## 2014-02-01 MED ORDER — GLIPIZIDE ER 10 MG PO TB24
10.0000 mg | ORAL_TABLET | Freq: Every day | ORAL | Status: DC
  Administered 2014-02-02 – 2014-02-05 (×4): 10 mg via ORAL
  Filled 2014-02-01 (×5): qty 1

## 2014-02-01 MED ORDER — DEXTROSE 5 % IV SOLN
1.0000 g | INTRAVENOUS | Status: DC
  Administered 2014-02-02 – 2014-02-04 (×3): 1 g via INTRAVENOUS
  Filled 2014-02-01 (×5): qty 10

## 2014-02-01 MED ORDER — DEXTROSE 5 % IV SOLN
1.0000 g | Freq: Once | INTRAVENOUS | Status: AC
  Administered 2014-02-01: 1 g via INTRAVENOUS
  Filled 2014-02-01: qty 10

## 2014-02-01 MED ORDER — SODIUM CHLORIDE 0.9 % IV SOLN: 250.0000 mL | INTRAVENOUS | Status: DC | PRN

## 2014-02-01 MED ORDER — SODIUM CHLORIDE 0.9 % IJ SOLN: 3.0000 mL | INTRAMUSCULAR | Status: DC | PRN

## 2014-02-01 MED ORDER — METHYLPREDNISOLONE SODIUM SUCC 125 MG IJ SOLR
60.0000 mg | Freq: Two times a day (BID) | INTRAMUSCULAR | Status: DC
  Administered 2014-02-02: 60 mg via INTRAVENOUS
  Filled 2014-02-01 (×3): qty 0.96

## 2014-02-01 MED ORDER — CLOPIDOGREL BISULFATE 75 MG PO TABS
75.0000 mg | ORAL_TABLET | Freq: Every day | ORAL | Status: DC
  Administered 2014-02-02 – 2014-02-05 (×5): 75 mg via ORAL
  Filled 2014-02-01 (×5): qty 1

## 2014-02-01 MED ORDER — ALBUTEROL SULFATE (2.5 MG/3ML) 0.083% IN NEBU: 2.5000 mg | INHALATION_SOLUTION | Freq: Four times a day (QID) | RESPIRATORY_TRACT | Status: DC

## 2014-02-01 MED ORDER — ALBUTEROL SULFATE (2.5 MG/3ML) 0.083% IN NEBU: 2.5000 mg | INHALATION_SOLUTION | RESPIRATORY_TRACT | Status: DC | PRN

## 2014-02-01 MED ORDER — LISINOPRIL 10 MG PO TABS
10.0000 mg | ORAL_TABLET | Freq: Every day | ORAL | Status: DC
  Administered 2014-02-02 – 2014-02-05 (×4): 10 mg via ORAL
  Filled 2014-02-01 (×4): qty 1

## 2014-02-01 NOTE — ED Notes (Signed)
Pt's sats dropped to 91% while ambulating, and continued to drop down to 87% after sitting down and reapplying oxygen.

## 2014-02-01 NOTE — ED Provider Notes (Signed)
CSN: [id number]     Arrival date & time 02/01/14  1257 History   First MD Initiated Contact with Patient 02/01/14 1501     Chief Complaint  Patient presents with  . Shortness of Breath     (Consider location/radiation/quality/duration/timing/severity/associated sxs/prior Treatment) HPI Comments: Patients with history of CHF, COPD, diabetes, current smoker -- presents with complaint of chest pain and shortness of breath began 2 days ago and is worse with exertion. Patient describes hot flashes but no documented fever. Cough worse than baseline but nonproductive. Chest pain is aching and constant in the middle of her chest. She has had nausea. No vomiting or diarrhea. No worsening lower extremity swelling. She has been compliant with blood thinners that she takes because of A. fib. Patient has been using her rescue inhaler every 4 hours. The onset of this condition was acute. The course is constant. Aggravating factors: none. Alleviating factors: none. Patient denies risk factors for pulmonary embolism including: unilateral leg swelling, history of DVT/PE/other blood clots, use of estrogens, recent immobilizations, recent surgery, recent travel (>4hr segment), malignancy, hemoptysis.      The history is provided by the patient and medical records.    Past Medical History  Diagnosis Date  . COPD (chronic obstructive pulmonary disease)   . Alcohol abuse   . Narcotic abuse   . Cocaine abuse   . Marijuana abuse   . Poorly controlled diabetes mellitus   . COPD (chronic obstructive pulmonary disease)   . Tobacco abuse   . Heart failure   . Alcoholic cirrhosis   . Cardiomyopathy   . Peripheral arterial disease   . Obesity   . CHF (congestive heart failure)   . Hypothyroidism   . GERD (gastroesophageal reflux disease)   . Headache(784.0)     "@ least once or twice/wk" (09/10/2013)  . Arthritis     "hands, arms, shoulders, legs, back" (09/10/2013)  . Atrial fibrillation   . Critical  lower limb ischemia   . Peripheral arterial disease    Past Surgical History  Procedure Laterality Date  . Lower extremity angiogram Right 09/10/2013    turbo hawk directional arthrectomy of a diffusely diseased SFA/notes 09/10/2013   . Cardioversion  ~ 02/2013    "twice" (09/10/2013)   Family History  Problem Relation Age of Onset  . Hypertension Mother   . Diabetes Mother   . Cancer Mother     breast, ovarian, colon  . Hypertension Father    History  Substance Use Topics  . Smoking status: Current Every Day Smoker -- 1.50 packs/day for 38 years    Types: Cigarettes  . Smokeless tobacco: Never Used  . Alcohol Use: 0.6 oz/week    1 Cans of beer per week     Comment: 09/10/2013 "might drink 1 beer/wk; never had problem w/it"   OB History   Grav Para Term Preterm Abortions TAB SAB Ect Mult Living   1 1 1       1      Review of Systems  Constitutional: Negative for fever.  HENT: Negative for rhinorrhea and sore throat.   Eyes: Negative for redness.  Respiratory: Positive for cough and shortness of breath.   Cardiovascular: Positive for chest pain. Negative for palpitations and leg swelling.  Gastrointestinal: Negative for nausea, vomiting, abdominal pain and diarrhea.  Genitourinary: Negative for dysuria.  Musculoskeletal: Negative for myalgias.  Skin: Negative for rash.  Neurological: Negative for headaches.      Allergies  Review of patient's  allergies indicates no known allergies.  Home Medications   Current Outpatient Rx  Name  Route  Sig  Dispense  Refill  . albuterol (PROVENTIL HFA;VENTOLIN HFA) 108 (90 BASE) MCG/ACT inhaler   Inhalation   Inhale 1-2 puffs into the lungs every 6 (six) hours as needed for wheezing or shortness of breath.         [name] aspirin EC 81 MG tablet   Oral   Take 1 tablet (81 mg total) by mouth daily.   120 tablet   3   . carvedilol (COREG) 6.25 MG tablet   Oral   Take 1 tablet (6.25 mg total) by mouth 2 (two) times daily with  a meal.   60 tablet   3   . clopidogrel (PLAVIX) 75 MG tablet   Oral   Take 1 tablet (75 mg total) by mouth daily with breakfast.   30 tablet   11   . furosemide (LASIX) 40 MG tablet   Oral   Take 40 mg by mouth daily.         [name] glipiZIDE (GLUCOTROL XL) 10 MG 24 hr tablet   Oral   Take 10 mg by mouth daily with breakfast.         . ipratropium-albuterol (DUONEB) 0.5-2.5 (3) MG/3ML SOLN   Nebulization   Take 3 mLs by nebulization.         [name] levothyroxine (SYNTHROID, LEVOTHROID) 50 MCG tablet   Oral   Take 1 tablet (50 mcg total) by mouth daily.   120 tablet   3   . lisinopril (PRINIVIL,ZESTRIL) 10 MG tablet   Oral   Take 10 mg by mouth daily.         . magnesium oxide (MAG-OX) 400 MG tablet   Oral   Take 1 tablet (400 mg total) by mouth daily.   30 tablet   1   . metFORMIN (GLUCOPHAGE) 1000 MG tablet   Oral   Take 1 tablet (1,000 mg total) by mouth 2 (two) times daily with a meal.   60 tablet   3     Resume on Friday.   . pentoxifylline (TRENTAL) 400 MG CR tablet   Oral   Take 400 mg by mouth 2 (two) times daily.         . Rivaroxaban (XARELTO) 20 MG TABS tablet   Oral   Take 20 mg by mouth every morning.           BP 137/71  Pulse 101  Temp(Src) 98.4 F (36.9 C) (Oral)  Resp 19  Wt 203 lb (92.08 kg)  SpO2 93%  LMP 11/27/2012  Physical Exam  Nursing note and vitals reviewed. Constitutional: She appears well-developed and well-nourished.  HENT:  Head: Normocephalic and atraumatic.  Mouth/Throat: Oropharynx is clear and moist and mucous membranes are normal. Mucous membranes are not dry.  Eyes: Conjunctivae are normal. Right eye exhibits no discharge. Left eye exhibits no discharge.  Neck: Trachea normal and normal range of motion. Neck supple. Normal carotid pulses and no JVD present. No muscular tenderness present. Carotid bruit is not present. No tracheal deviation present.  Cardiovascular: Normal rate, regular rhythm, S1 normal, S2  normal, normal heart sounds and intact distal pulses.  Exam reveals no decreased pulses.   No murmur heard. Pulmonary/Chest: Effort normal. No respiratory distress. She has wheezes (Moderate expiratory wheezing in all fields). She exhibits no tenderness.  Patient maintains normal oxygen saturation by nasal cannula when discontinued.  Abdominal: Soft.  Normal aorta and bowel sounds are normal. There is no tenderness. There is no rebound and no guarding.  Musculoskeletal: Normal range of motion. She exhibits edema. She exhibits no tenderness.  Trace pedal edema to ankles bilaterally.  Neurological: She is alert.  Skin: Skin is warm and dry. She is not diaphoretic. No cyanosis. No pallor.  Psychiatric: She has a normal mood and affect.    ED Course  Procedures (including critical care time) Labs Review Labs Reviewed  CBC - Abnormal; Notable for the following:    WBC 10.7 (*)    All other components within normal limits  BASIC METABOLIC PANEL - Abnormal; Notable for the following:    Chloride 95 (*)    Glucose, Bld 204 (*)    All other components within normal limits  PRO B NATRIURETIC PEPTIDE - Abnormal; Notable for the following:    Pro B Natriuretic peptide (BNP) 1615.0 (*)    All other components within normal limits  I-STAT TROPOININ, ED   Imaging Review Dg Chest 2 View  02/01/2014   CLINICAL DATA:  CHF  EXAM: CHEST  2 VIEW  COMPARISON:  DG CHEST 2 VIEW dated 09/27/2013  FINDINGS: Normal heart size. Hazy right middle lobe airspace disease. Left lung grossly clear. Low volumes. No pneumothorax.  IMPRESSION: Right middle lobe airspace disease. Follow-up studies until resolution are recommended.   Electronically Signed   By: Maryclare Bean M.D.   On: 02/01/2014 14:01     EKG Interpretation None      3:12 PM Patient seen and examined. Work-up initiated. Medications ordered. Will give breathing treatment, antibiotic for community-acquired pneumonia, and ambulate.  Vital signs reviewed  and are as follows: Filed Vitals:   02/01/14 1407  BP: 137/71  Pulse: 101  Temp:   Resp: 19   6:02 PM De-sat to 87% when ambulating just a few feet. She will need admission. She does state some subjective improvement after first neb.   Abx, steroids ordered, additional breathing treatments ordered.    MDM   Final diagnoses:  CAP (community acquired pneumonia)  COPD exacerbation   Admit.     Carlisle Cater, PA-C 02/01/14 [phone]

## 2014-02-01 NOTE — H&P (Signed)
[facility] History and Physical  ADELA Price [id number] DOB: 1961-01-29 DOA: 02/01/2014  Referring physician:  ED: PA: Carlisle Cater PCP: Angelica Chessman, MD   Chief Complaint: Shortness of breath  HPI: [name] is a 53 y.o. female  With history of peripheral vascular disease with recent evaluation with Doppler with findings suggestive of critical limb ischemia with an occluded SFA and popliteal arteries bilateral with tibial vessel disease and recent turbo hot directional atherectomy of her right SFA with an excellent angiographic result on 10 2014. Also with a history of COPD, hypothyroidism, tobacco abuse with prior history of illicit drug use per EMR. States that for the last 3 days she has been more short of breath. Nothing she is aware of makes it better. She did try her inhalers with minimal relief. She denies any sick contacts or hemoptysis.  In the ED was found to be hypoxic to 87% on room air with activity. And chest x-ray reported right middle lobe airspace disease. Subsequently we were consulted for further evaluation and recommendations.   Review of Systems:  Constitutional:  No weight loss, night sweats, Fevers, chills, fatigue.  HEENT:  No headaches, Difficulty swallowing,Tooth/dental problems,Sore throat,  No sneezing, itching, ear ache, nasal congestion, post nasal drip,  Cardio-vascular:  No chest pain, Orthopnea, PND, swelling in lower extremities, anasarca, dizziness, palpitations  GI:  No heartburn, indigestion, abdominal pain, nausea, vomiting, diarrhea, change in bowel habits, loss of appetite  Resp:  + shortness of breath with exertion or at rest. + excess mucus,+ productive cough, + non-productive cough, No coughing up of blood.+ change in color of mucus.+ wheezing.No chest wall deformity  Skin:  no rash or lesions.  GU:  no dysuria, change in color of urine, no urgency or frequency. No flank pain.  Musculoskeletal:  No joint pain or  swelling. No decreased range of motion. No back pain.  Psych:  No change in mood or affect. No depression or anxiety. No memory loss.   Past Medical History  Diagnosis Date  . COPD (chronic obstructive pulmonary disease)   . Alcohol abuse   . Narcotic abuse   . Cocaine abuse   . Marijuana abuse   . Poorly controlled diabetes mellitus   . COPD (chronic obstructive pulmonary disease)   . Tobacco abuse   . Heart failure   . Alcoholic cirrhosis   . Cardiomyopathy   . Peripheral arterial disease   . Obesity   . CHF (congestive heart failure)   . Hypothyroidism   . GERD (gastroesophageal reflux disease)   . Headache(784.0)     "@ least once or twice/wk" (09/10/2013)  . Arthritis     "hands, arms, shoulders, legs, back" (09/10/2013)  . Atrial fibrillation   . Critical lower limb ischemia   . Peripheral arterial disease    Past Surgical History  Procedure Laterality Date  . Lower extremity angiogram Right 09/10/2013    turbo hawk directional arthrectomy of a diffusely diseased SFA/notes 09/10/2013   . Cardioversion  ~ 02/2013    "twice" (09/10/2013)   Social History:  reports that she has been smoking Cigarettes.  She has a 57 pack-year smoking history. She has never used smokeless tobacco. She reports that she drinks about 0.6 ounces of alcohol per week. She reports that she uses illicit drugs ("Crack" cocaine and Marijuana).  No Known Allergies  Family History  Problem Relation Age of Onset  . Hypertension Mother   . Diabetes Mother   . Cancer Mother  breast, ovarian, colon  . Hypertension Father      Prior to Admission medications   Medication Sig Start Date End Date Taking? Authorizing Provider  albuterol (PROVENTIL HFA;VENTOLIN HFA) 108 (90 BASE) MCG/ACT inhaler Inhale 1-2 puffs into the lungs every 6 (six) hours as needed for wheezing or shortness of breath.   Yes Historical Provider, MD  aspirin EC 81 MG tablet Take 1 tablet (81 mg total) by mouth daily. 07/31/13   Yes Renella Cunas, MD  carvedilol (COREG) 6.25 MG tablet Take 1 tablet (6.25 mg total) by mouth 2 (two) times daily with a meal. 07/26/13  Yes Robbie Lis, MD  clopidogrel (PLAVIX) 75 MG tablet Take 1 tablet (75 mg total) by mouth daily with breakfast. 09/11/13  Yes Tarri Fuller, PA-C  furosemide (LASIX) 40 MG tablet Take 40 mg by mouth daily.   Yes Historical Provider, MD  glipiZIDE (GLUCOTROL XL) 10 MG 24 hr tablet Take 10 mg by mouth daily with breakfast. 07/26/13  Yes Robbie Lis, MD  ipratropium-albuterol (DUONEB) 0.5-2.5 (3) MG/3ML SOLN Take 3 mLs by nebulization.   Yes Historical Provider, MD  levothyroxine (SYNTHROID, LEVOTHROID) 50 MCG tablet Take 1 tablet (50 mcg total) by mouth daily. 07/31/13  Yes Renella Cunas, MD  lisinopril (PRINIVIL,ZESTRIL) 10 MG tablet Take 10 mg by mouth daily.   Yes Historical Provider, MD  magnesium oxide (MAG-OX) 400 MG tablet Take 1 tablet (400 mg total) by mouth daily. 07/26/13  Yes Robbie Lis, MD  metFORMIN (GLUCOPHAGE) 1000 MG tablet Take 1 tablet (1,000 mg total) by mouth 2 (two) times daily with a meal. 09/11/13  Yes Tarri Fuller, PA-C  pentoxifylline (TRENTAL) 400 MG CR tablet Take 400 mg by mouth 2 (two) times daily.   Yes Historical Provider, MD  Rivaroxaban (XARELTO) 20 MG TABS tablet Take 20 mg by mouth every morning.    Yes Historical Provider, MD   Physical Exam: Filed Vitals:   02/01/14 1745  BP: 163/80  Pulse: 100  Temp:   Resp: 16    BP 163/80  Pulse 100  Temp(Src) 98.4 F (36.9 C) (Oral)  Resp 16  Wt 92.08 kg (203 lb)  SpO2 96%  LMP 11/27/2012  General:  Appears calm and comfortable, patient looks older than stated age Eyes: PERRL, normal lids, irises & conjunctiva ENT: grossly normal hearing, lips & tongue Neck: no LAD, masses or thyromegaly Cardiovascular: RRR, no m/r/g. No LE edema. Telemetry: SR, no arrhythmias  Respiratory: Mild expiratory wheeze bilaterally, breath sounds auscultated bilaterally, chest rise equal,  prolonged expiratory phase Abdomen: soft, nt, nd Skin: no rash or induration seen on limited exam Musculoskeletal:  no clubbing Psychiatric: grossly normal mood and affect, speech fluent and appropriate Neurologic: Answers questions appropriately, moves extremities equally, no facial asymmetry           Labs on Admission:  Basic Metabolic Panel:  Recent Labs Lab 02/01/14 1307  NA 138  K 4.2  CL 95*  CO2 25  GLUCOSE 204*  BUN 15  CREATININE 0.71  CALCIUM 9.3   Liver Function Tests: No results found for this basename: AST, ALT, ALKPHOS, BILITOT, PROT, ALBUMIN,  in the last 168 hours No results found for this basename: LIPASE, AMYLASE,  in the last 168 hours No results found for this basename: AMMONIA,  in the last 168 hours CBC:  Recent Labs Lab 02/01/14 1307  WBC 10.7*  HGB 12.4  HCT 36.4  MCV 88.8  PLT 302  Cardiac Enzymes: No results found for this basename: CKTOTAL, CKMB, CKMBINDEX, TROPONINI,  in the last 168 hours  BNP (last 3 results)  Recent Labs  02/01/14 1307  PROBNP 1615.0*   CBG: No results found for this basename: GLUCAP,  in the last 168 hours  Radiological Exams on Admission: Dg Chest 2 View  02/01/2014   CLINICAL DATA:  CHF  EXAM: CHEST  2 VIEW  COMPARISON:  DG CHEST 2 VIEW dated 09/27/2013  FINDINGS: Normal heart size. Hazy right middle lobe airspace disease. Left lung grossly clear. Low volumes. No pneumothorax.  IMPRESSION: Right middle lobe airspace disease. Follow-up studies until resolution are recommended.   Electronically Signed   By: Maryclare Bean M.D.   On: 02/01/2014 14:01    EKG: Independently reviewed. Sinus rhythm with no ST elevations or depressions.  Assessment/Plan Principal problem: Respiratory failure -Will provide supplemental oxygen - Most likely secondary to COPD with new diagnosis of CAP - IV antibiotics  Active Problems:   COPD (chronic obstructive pulmonary disease)/CAP - Obtain urine Legionella/strep antigen -  Rocephin and azithromycin for community-acquired pneumonia -Supplemental oxygen -Bronchodilators and steroids    Atrial fibrillation - Currently in sinus rhythm but will comply on continuing home regimen including Xarelto    Tobacco abuse - Recommended cessation    Hypothyroidism - Stable continue home regimen   Code Status: Full Family Communication: Discussed with patient and sister Disposition Plan: Pending improvement in respiratory condition. Pt sinus rhythm on telemetry will admit to med surg  Time spent: > 55 minutes  Velvet Bathe [facility] Pager [phone]

## 2014-02-01 NOTE — ED Notes (Signed)
States shes felt like she cant breathe and like something is laying on her chest for 2 days. This started 2 days ago while she was moving. She states she was stressed at the time. She states symptoms have persisted since.

## 2014-02-01 NOTE — Consult Note (Signed)
PHARMACY NOTE  CONSULT :  Ceftriaxone INDICATION :  CAP  OBJECTIVE:  Pharmacy consulted for dosing Ceftriaxone and Azithromycin.   Patient is to receive Azithromycin and Ceftriaxone for CAP.[name]  Weight  92 kg ,  CrCl  92  Ml/min Afebrile  98.5 F (36.9 C) (Oral) ,    Component Value Date   WBC 10.7* 02/01/2014     PLAN:  1. Continue Azithromycin 500 mg IV q 24 hours.  2. Begin Ceftriaxone 1 gm IV q 24 hours.  No renal adjustments required.    3. Recommend Monitoring renal function, Hepatic baseline function, WBC's, fever curve, any cultures/sensitivities, and clinical progression. 4. Pharmacy will sign off and follow peripherally given no adjustments in doses or schedules are expected. 5. Pharmacy has alerts in place to inform us of dramatic changes in renal or hepatic function that might require dose or schedule adjustments. 6. Please re-consult if additional assistance is needed, or antibiotic selection is changed.  Thank you for allowing Pharmacy to participate in this patient's care   Estelle June,  Pharm.D. ,  02/01/2014,  7:36 PM

## 2014-02-02 DIAGNOSIS — J9600 Acute respiratory failure, unspecified whether with hypoxia or hypercapnia: Principal | ICD-10-CM

## 2014-02-02 DIAGNOSIS — F17200 Nicotine dependence, unspecified, uncomplicated: Secondary | ICD-10-CM

## 2014-02-02 DIAGNOSIS — E039 Hypothyroidism, unspecified: Secondary | ICD-10-CM

## 2014-02-02 DIAGNOSIS — I4891 Unspecified atrial fibrillation: Secondary | ICD-10-CM

## 2014-02-02 DIAGNOSIS — J441 Chronic obstructive pulmonary disease with (acute) exacerbation: Secondary | ICD-10-CM

## 2014-02-02 DIAGNOSIS — J13 Pneumonia due to Streptococcus pneumoniae: Secondary | ICD-10-CM | POA: Diagnosis present

## 2014-02-02 LAB — BASIC METABOLIC PANEL
BUN: 23 mg/dL (ref 6–23)
CO2: 22 mEq/L (ref 19–32)
Calcium: 8.9 mg/dL (ref 8.4–10.5)
Chloride: 96 mEq/L (ref 96–112)
Creatinine, Ser: 0.66 mg/dL (ref 0.50–1.10)
GFR calc non Af Amer: >90 mL/min (ref >90)
Glucose, Bld: 353 mg/dL — ABNORMAL HIGH (ref 70–99)
POTASSIUM: 4.5 meq/L (ref 3.7–5.3)
Sodium: 136 mEq/L — ABNORMAL LOW (ref 137–147)

## 2014-02-02 LAB — CBC
HEMATOCRIT: 32.3 % — AB (ref 36.0–46.0)
HEMOGLOBIN: 10.9 g/dL — AB (ref 12.0–15.0)
MCH: 29.8 pg (ref 26.0–34.0)
MCHC: 33.7 g/dL (ref 30.0–36.0)
MCV: 88.3 fL (ref 78.0–100.0)
Platelets: 241 10*3/uL (ref 150–400)
RBC: 3.66 MIL/uL — ABNORMAL LOW (ref 3.87–5.11)
RDW: 13.5 % (ref 11.5–15.5)
WBC: 11.5 10*3/uL — ABNORMAL HIGH (ref 4.0–10.5)

## 2014-02-02 LAB — GLUCOSE, CAPILLARY
Glucose-Capillary: 321 mg/dL — ABNORMAL HIGH (ref 70–99)
Glucose-Capillary: 322 mg/dL — ABNORMAL HIGH (ref 70–99)
Glucose-Capillary: 419 mg/dL — ABNORMAL HIGH (ref 70–99)
Glucose-Capillary: 468 mg/dL — ABNORMAL HIGH (ref 70–99)
Glucose-Capillary: 533 mg/dL — ABNORMAL HIGH (ref 70–99)
Glucose-Capillary: 535 mg/dL — ABNORMAL HIGH (ref 70–99)

## 2014-02-02 MED ORDER — INSULIN ASPART 100 UNIT/ML ~~LOC~~ SOLN
0.0000 [IU] | Freq: Three times a day (TID) | SUBCUTANEOUS | Status: DC
  Administered 2014-02-02 – 2014-02-03 (×3): 20 [IU] via SUBCUTANEOUS

## 2014-02-02 MED ORDER — INSULIN ASPART 100 UNIT/ML ~~LOC~~ SOLN
10.0000 [IU] | Freq: Three times a day (TID) | SUBCUTANEOUS | Status: DC
  Administered 2014-02-03: 10 [IU] via SUBCUTANEOUS

## 2014-02-02 MED ORDER — INSULIN DETEMIR 100 UNIT/ML ~~LOC~~ SOLN
10.0000 [IU] | Freq: Every day | SUBCUTANEOUS | Status: DC
  Administered 2014-02-02: 10 [IU] via SUBCUTANEOUS
  Filled 2014-02-02 (×3): qty 0.1

## 2014-02-02 MED ORDER — INSULIN ASPART 100 UNIT/ML ~~LOC~~ SOLN
15.0000 [IU] | Freq: Once | SUBCUTANEOUS | Status: AC
  Administered 2014-02-02: 15 [IU] via SUBCUTANEOUS

## 2014-02-02 MED ORDER — INSULIN ASPART 100 UNIT/ML ~~LOC~~ SOLN: 0.0000 [IU] | Freq: Every day | SUBCUTANEOUS | Status: DC

## 2014-02-02 MED ORDER — PREDNISONE 20 MG PO TABS
40.0000 mg | ORAL_TABLET | Freq: Two times a day (BID) | ORAL | Status: DC
  Administered 2014-02-02: 40 mg via ORAL
  Filled 2014-02-02 (×3): qty 2

## 2014-02-02 MED ORDER — IPRATROPIUM-ALBUTEROL 0.5-2.5 (3) MG/3ML IN SOLN
3.0000 mL | Freq: Three times a day (TID) | RESPIRATORY_TRACT | Status: DC
  Administered 2014-02-03 – 2014-02-05 (×8): 3 mL via RESPIRATORY_TRACT
  Filled 2014-02-02 (×8): qty 3

## 2014-02-02 NOTE — Progress Notes (Signed)
Page by Timmothy Sours  [phone] patient's CBG have continued to climb although she has been administered her all of her prescribed diabetic medication         A/P  Hyperglycemia -Obtain CMP, magnesium, CBC -If patient has an anion gap will start on glucose stabilizer -Administered 15 units NovoLog subcutaneous now -Ag= 16 -Start normal saline 3ml/hr (patient with cardiomyopathy); much more likely to have fluid overload problems on glucose stabilizer protocol therefore will only gently hydrate -Change CBG to q 4 hr; at 0110 patient's CBG administered 10 units NovoLog subcutaneous

## 2014-02-02 NOTE — Progress Notes (Signed)
Chart reviewed.   [facility] PROGRESS NOTE  [name] [id number] DOB: 07-Feb-1961 DOA: 02/01/2014 PCP: Angelica Chessman, MD  Assessment/Plan:  Principal Problem:   Acute respiratory failure: wean oxygen as able Active Problems: Pneumococcal pneumonia: continue rocephin. D/c azithro COPD with acute exacerbation: change to po steroids. Continue HHN.   H/o Atrial fibrillation   Tobacco abuse: 2PPD. Needs to quit.  Denies recent illicit drugs   Long term (current) use of anticoagulants   Hypothyroidism   Atherosclerotic peripheral vascular disease with ulceration   DM type 2, uncontrolled, with neuropathy: add SSI and levemir. hgb A1C pending   Code Status:  full Family Communication:   Disposition Plan:  home  HPI/Subjective: feelt better, but not yet back to baseline  Objective: Filed Vitals:   02/02/14 0657  BP: 160/72  Pulse: 79  Temp: 97.6 F (36.4 C)  Resp: 18    Intake/Output Summary (Last 24 hours) at 02/02/14 1148 Last data filed at 02/02/14 0658  Gross per 24 hour  Intake    480 ml  Output    300 ml  Net    180 ml   Filed Weights   02/01/14 1312 02/01/14 1908  Weight: 92.08 kg (203 lb) 92.08 kg (203 lb)    Exam:   General:  Eating lunch in chair. [place] on forehead  Cardiovascular: RRR without MGR  Respiratory: good air movement. Slight wheeze and rhonchi  Abdomen: S, NT, ND  Ext: no CCE  Basic Metabolic Panel:  Recent Labs Lab 02/01/14 1307 02/01/14 2038 02/02/14 0625  NA 138  --  136*  K 4.2  --  4.5  CL 95*  --  96  CO2 25  --  22  GLUCOSE 204*  --  353*  BUN 15  --  23  CREATININE 0.71  --  0.66  CALCIUM 9.3  --  8.9  MG  --  1.5  --   PHOS  --  3.9  --    Liver Function Tests: No results found for this basename: AST, ALT, ALKPHOS, BILITOT, PROT, ALBUMIN,  in the last 168 hours No results found for this basename: LIPASE, AMYLASE,  in the last 168 hours No results found for this basename: AMMONIA,  in the last  168 hours CBC:  Recent Labs Lab 02/01/14 1307 02/02/14 0625  WBC 10.7* 11.5*  HGB 12.4 10.9*  HCT 36.4 32.3*  MCV 88.8 88.3  PLT 302 241   Cardiac Enzymes: No results found for this basename: CKTOTAL, CKMB, CKMBINDEX, TROPONINI,  in the last 168 hours BNP (last 3 results)  Recent Labs  02/01/14 1307  PROBNP 1615.0*   CBG:  Recent Labs Lab 02/01/14 2219 02/02/14 0659  GLUCAP 347* 322*    No results found for this or any previous visit (from the past 240 hour(s)).   Studies: Dg Chest 2 View  02/01/2014   CLINICAL DATA:  CHF  EXAM: CHEST  2 VIEW  COMPARISON:  DG CHEST 2 VIEW dated 09/27/2013  FINDINGS: Normal heart size. Hazy right middle lobe airspace disease. Left lung grossly clear. Low volumes. No pneumothorax.  IMPRESSION: Right middle lobe airspace disease. Follow-up studies until resolution are recommended.   Electronically Signed   By: Maryclare Bean M.D.   On: 02/01/2014 14:01    Scheduled Meds: . aspirin EC  81 mg Oral Daily  . azithromycin  500 mg Intravenous Q24H  . carvedilol  6.25 mg Oral BID WC  . cefTRIAXone (ROCEPHIN)  IV  1 g Intravenous Q24H  . clopidogrel  75 mg Oral Q breakfast  . furosemide  40 mg Oral Daily  . glipiZIDE  10 mg Oral Q breakfast  . ipratropium-albuterol  3 mL Nebulization Q6H  . levothyroxine  50 mcg Oral QAC breakfast  . lisinopril  10 mg Oral Daily  . methylPREDNISolone (SOLU-MEDROL) injection  60 mg Intravenous Q12H  . pentoxifylline  400 mg Oral BID  . Rivaroxaban  20 mg Oral Q supper  . sodium chloride  3 mL Intravenous Q12H   Continuous Infusions:   Time spent: 35 minutes  [facility] Pager [phone]. If 7PM-7AM, please contact night-coverage at [email or web address], password [facility] 02/02/2014, 11:48 AM  LOS: 1 day

## 2014-02-02 NOTE — ED Provider Notes (Signed)
Medical screening examination/treatment/procedure(s) were performed by non-physician practitioner and as supervising physician I was immediately available for consultation/collaboration.  Richarda Blade, MD 02/02/14 [phone]

## 2014-02-02 NOTE — Progress Notes (Signed)
Utilization review completed.  

## 2014-02-02 NOTE — Progress Notes (Signed)
Dr Conley Canal paged regarding cbg's [phone].

## 2014-02-03 DIAGNOSIS — J13 Pneumonia due to Streptococcus pneumoniae: Secondary | ICD-10-CM

## 2014-02-03 DIAGNOSIS — E1149 Type 2 diabetes mellitus with other diabetic neurological complication: Secondary | ICD-10-CM

## 2014-02-03 DIAGNOSIS — G4700 Insomnia, unspecified: Secondary | ICD-10-CM

## 2014-02-03 DIAGNOSIS — E1142 Type 2 diabetes mellitus with diabetic polyneuropathy: Secondary | ICD-10-CM

## 2014-02-03 DIAGNOSIS — K5900 Constipation, unspecified: Secondary | ICD-10-CM

## 2014-02-03 LAB — GLUCOSE, CAPILLARY
GLUCOSE-CAPILLARY: 159 mg/dL — AB (ref 70–99)
GLUCOSE-CAPILLARY: 182 mg/dL — AB (ref 70–99)
GLUCOSE-CAPILLARY: 248 mg/dL — AB (ref 70–99)
GLUCOSE-CAPILLARY: 280 mg/dL — AB (ref 70–99)
GLUCOSE-CAPILLARY: 391 mg/dL — AB (ref 70–99)
GLUCOSE-CAPILLARY: 447 mg/dL — AB (ref 70–99)
GLUCOSE-CAPILLARY: 530 mg/dL — AB (ref 70–99)
Glucose-Capillary: 183 mg/dL — ABNORMAL HIGH (ref 70–99)
Glucose-Capillary: 497 mg/dL — ABNORMAL HIGH (ref 70–99)
Glucose-Capillary: 421 mg/dL — ABNORMAL HIGH (ref 70–99)
Glucose-Capillary: 339 mg/dL — ABNORMAL HIGH (ref 70–99)
Glucose-Capillary: 156 mg/dL — ABNORMAL HIGH (ref 70–99)
Glucose-Capillary: 302 mg/dL — ABNORMAL HIGH (ref 70–99)
Glucose-Capillary: 209 mg/dL — ABNORMAL HIGH (ref 70–99)
Glucose-Capillary: 234 mg/dL — ABNORMAL HIGH (ref 70–99)
Glucose-Capillary: 186 mg/dL — ABNORMAL HIGH (ref 70–99)
Glucose-Capillary: 119 mg/dL — ABNORMAL HIGH (ref 70–99)

## 2014-02-03 LAB — BASIC METABOLIC PANEL
BUN: 29 mg/dL — ABNORMAL HIGH (ref 6–23)
CALCIUM: 8.7 mg/dL (ref 8.4–10.5)
CO2: 25 meq/L (ref 19–32)
Chloride: 95 mEq/L — ABNORMAL LOW (ref 96–112)
Creatinine, Ser: 0.72 mg/dL (ref 0.50–1.10)
GFR calc Af Amer: >90 mL/min (ref >90)
GFR calc non Af Amer: >90 mL/min (ref >90)
Glucose, Bld: 616 mg/dL (ref 70–99)
POTASSIUM: 4 meq/L (ref 3.7–5.3)
Sodium: 136 mEq/L — ABNORMAL LOW (ref 137–147)

## 2014-02-03 LAB — COMPREHENSIVE METABOLIC PANEL
ALT: 14 U/L (ref 0–35)
AST: 10 U/L (ref 0–37)
Albumin: 2.9 g/dL — ABNORMAL LOW (ref 3.5–5.2)
Alkaline Phosphatase: 109 U/L (ref 39–117)
BUN: 33 mg/dL — ABNORMAL HIGH (ref 6–23)
CALCIUM: 9.2 mg/dL (ref 8.4–10.5)
CO2: 25 mEq/L (ref 19–32)
Chloride: 94 mEq/L — ABNORMAL LOW (ref 96–112)
Creatinine, Ser: 0.9 mg/dL (ref 0.50–1.10)
GFR calc Af Amer: 84 mL/min — ABNORMAL LOW (ref >90)
GFR calc non Af Amer: 73 mL/min — ABNORMAL LOW (ref >90)
Glucose, Bld: 379 mg/dL — ABNORMAL HIGH (ref 70–99)
POTASSIUM: 3.7 meq/L (ref 3.7–5.3)
Sodium: 135 mEq/L — ABNORMAL LOW (ref 137–147)
Total Bilirubin: <0.2 mg/dL — ABNORMAL LOW (ref 0.3–1.2)
Total Protein: 7.2 g/dL (ref 6.0–8.3)

## 2014-02-03 LAB — MAGNESIUM: Magnesium: 1.9 mg/dL (ref 1.5–2.5)

## 2014-02-03 LAB — CBC WITH DIFFERENTIAL/PLATELET
Basophils Absolute: 0 10*3/uL (ref 0.0–0.1)
Basophils Relative: 0 % (ref 0–1)
EOS ABS: 0 10*3/uL (ref 0.0–0.7)
EOS PCT: 0 % (ref 0–5)
HCT: 32.2 % — ABNORMAL LOW (ref 36.0–46.0)
HEMOGLOBIN: 11.1 g/dL — AB (ref 12.0–15.0)
Lymphocytes Relative: 14 % (ref 12–46)
Lymphs Abs: 2.3 10*3/uL (ref 0.7–4.0)
MCH: 30.4 pg (ref 26.0–34.0)
MCHC: 34.5 g/dL (ref 30.0–36.0)
MCV: 88.2 fL (ref 78.0–100.0)
MONOS PCT: 5 % (ref 3–12)
Monocytes Absolute: 0.8 10*3/uL (ref 0.1–1.0)
Neutro Abs: 13.5 10*3/uL — ABNORMAL HIGH (ref 1.7–7.7)
Neutrophils Relative %: 81 % — ABNORMAL HIGH (ref 43–77)
Platelets: 301 10*3/uL (ref 150–400)
RBC: 3.65 MIL/uL — ABNORMAL LOW (ref 3.87–5.11)
RDW: 13.4 % (ref 11.5–15.5)
WBC: 16.7 10*3/uL — ABNORMAL HIGH (ref 4.0–10.5)

## 2014-02-03 LAB — LEGIONELLA ANTIGEN, URINE: Legionella Antigen, Urine: NEGATIVE

## 2014-02-03 LAB — HEMOGLOBIN A1C
Hgb A1c MFr Bld: 8.1 % — ABNORMAL HIGH (ref <5.7)
MEAN PLASMA GLUCOSE: 186 mg/dL — AB (ref <117)

## 2014-02-03 MED ORDER — DEXTROSE 50 % IV SOLN: 25.0000 mL | INTRAVENOUS | Status: DC | PRN

## 2014-02-03 MED ORDER — SODIUM CHLORIDE 0.9 % IV SOLN
INTRAVENOUS | Status: DC
  Administered 2014-02-03: 11:00:00 via INTRAVENOUS

## 2014-02-03 MED ORDER — POLYETHYLENE GLYCOL 3350 17 G PO PACK
17.0000 g | PACK | Freq: Every day | ORAL | Status: DC
  Administered 2014-02-03 – 2014-02-05 (×3): 17 g via ORAL
  Filled 2014-02-03 (×4): qty 1

## 2014-02-03 MED ORDER — PREDNISONE 20 MG PO TABS
40.0000 mg | ORAL_TABLET | Freq: Every day | ORAL | Status: DC
  Administered 2014-02-04 – 2014-02-05 (×2): 40 mg via ORAL
  Filled 2014-02-03 (×3): qty 2

## 2014-02-03 MED ORDER — SODIUM CHLORIDE 0.9 % IV SOLN: 250.0000 mL | INTRAVENOUS | Status: DC | PRN

## 2014-02-03 MED ORDER — GABAPENTIN 300 MG PO CAPS
300.0000 mg | ORAL_CAPSULE | Freq: Every day | ORAL | Status: DC
  Administered 2014-02-03 – 2014-02-04 (×2): 300 mg via ORAL
  Filled 2014-02-03 (×3): qty 1

## 2014-02-03 MED ORDER — INSULIN ASPART 100 UNIT/ML ~~LOC~~ SOLN
10.0000 [IU] | Freq: Once | SUBCUTANEOUS | Status: AC
  Administered 2014-02-03: 10 [IU] via SUBCUTANEOUS

## 2014-02-03 MED ORDER — DIPHENHYDRAMINE HCL 25 MG PO CAPS
50.0000 mg | ORAL_CAPSULE | Freq: Every evening | ORAL | Status: DC | PRN
  Administered 2014-02-03: 50 mg via ORAL
  Filled 2014-02-03: qty 2

## 2014-02-03 MED ORDER — INSULIN DETEMIR 100 UNIT/ML ~~LOC~~ SOLN
15.0000 [IU] | Freq: Two times a day (BID) | SUBCUTANEOUS | Status: DC
  Filled 2014-02-03 (×2): qty 0.15

## 2014-02-03 MED ORDER — INSULIN REGULAR BOLUS VIA INFUSION
0.0000 [IU] | Freq: Three times a day (TID) | INTRAVENOUS | Status: DC
  Administered 2014-02-03: 5.4 [IU] via INTRAVENOUS
  Administered 2014-02-03: 6.1 [IU] via INTRAVENOUS
  Filled 2014-02-03: qty 10

## 2014-02-03 MED ORDER — SODIUM CHLORIDE 0.9 % IV SOLN
INTRAVENOUS | Status: DC
  Administered 2014-02-03: 7.6 [IU]/h via INTRAVENOUS
  Administered 2014-02-03: 3.9 [IU]/h via INTRAVENOUS
  Filled 2014-02-03 (×2): qty 1

## 2014-02-03 NOTE — Progress Notes (Addendum)
Inpatient Diabetes Program Recommendations  AACE/ADA: New Consensus Statement on Inpatient Glycemic Control (2013)  Target Ranges:  Prepandial:   less than 140 mg/dL      Peak postprandial:   less than 180 mg/dL (1-2 hours)      Critically ill patients:  140 - 180 mg/dL   Reason for Visit: Hyperglycemia  Diabetes history: DM2 Outpatient Diabetes medications: glipizide 10 mg bid and metformin 1000 mg bid Current orders for Inpatient glycemic control: IV insulin/GlucoStabilizer  53 year old female admitted with SOB. Type 2 DM on oral agents at home. While in hospital, CBGs continued to climb. Lab glucose 601 and AG - 16 and IV insulin started. Pt has been non-compliant in the past with medications per hx. Will discuss tomorrow morning importance of glucose control at home and taking meds as prescribed.  Continue with insulin drip until acidosis is cleared and CBGs within range x 4. Will likely need to be discharged on insulin. Will order insulin starter kit and RN to begin teaching insulin administration if pt to be discharged on insulin. Please add CHO mod med to heart healthy diet.  Will follow-up in am. Thank you. Lorenda Peck, RD, LDN, CDE Inpatient Diabetes Coordinator [phone]

## 2014-02-03 NOTE — Progress Notes (Signed)
Addendum: BMET glucose 616 and gap 16. Will start insulin gtt and IVF.  Doree Barthel, M.D.

## 2014-02-03 NOTE — Progress Notes (Addendum)
[facility] PROGRESS NOTE  [name] [id number] DOB: 05-11-1961 DOA: 02/01/2014 PCP: Angelica Chessman, MD  Assessment/Plan:  Principal Problem:   Acute respiratory failure: wean oxygen as able Active Problems: Pneumococcal pneumonia: continue rocephin.  COPD with acute exacerbation: wean steroids quickly as able due to difficult to control DM. Still fairy wheezy    H/o Atrial fibrillation on xarelto   Tobacco abuse: 2PPD. Needs to quit.  Denies recent illicit drugs   Long term (current) use of anticoagulants   Hypothyroidism   Atherosclerotic peripheral vascular disease with ulceration on trental   DM type 2, uncontrolled, with neuropathy: CBGs high last night and this morning.  repeat BMET. Increase levemir and novolog. Continue glucotrol.  Reports having been on metformin in the past, but for some reason, taken off.  Also was to start insulin, but couldn't afford the insulin. As pt now has medicaid, long acting insulin may be the best choice at discharge. hgb A1c>8. Will have RN practice insulin injection with patient Constipation: miralax. Insomnia: benadryl Obesity Diabetic neuropathy: pt reports medicaid does not cover lyrica. Would like to try gabapentin  Code Status:  full Family Communication:   Disposition Plan:  home  HPI/Subjective: Still with wheezing and cough.  C/o constipation and insomnia.  Dyspnea better, but not back to baseline. C/o peripheral neuropathy pain  Objective: Filed Vitals:   02/03/14 0446  BP: 159/72  Pulse: 90  Temp: 97.6 F (36.4 C)  Resp: 18    Intake/Output Summary (Last 24 hours) at 02/03/14 0852 Last data filed at 02/03/14 0449  Gross per 24 hour  Intake    410 ml  Output      0 ml  Net    410 ml   Filed Weights   02/01/14 1312 02/01/14 1908  Weight: 92.08 kg (203 lb) 92.08 kg (203 lb)    Exam:   General:  Asleep in chair off oxygen  Cardiovascular: RRR without MGR  Respiratory: wheeze, rhonchi.  Prolonged expiratory phase  Abdomen: S, NT, ND  Ext: no CCE  Basic Metabolic Panel:  Recent Labs Lab 02/01/14 1307 02/01/14 2038 02/02/14 0625 02/02/14 2329  NA 138  --  136* 135*  K 4.2  --  4.5 3.7  CL 95*  --  96 94*  CO2 25  --  22 25  GLUCOSE 204*  --  353* 379*  BUN 15  --  23 33*  CREATININE 0.71  --  0.66 0.90  CALCIUM 9.3  --  8.9 9.2  MG  --  1.5  --  1.9  PHOS  --  3.9  --   --    Liver Function Tests:  Recent Labs Lab 02/02/14 2329  AST 10  ALT 14  ALKPHOS 109  BILITOT <0.2*  PROT 7.2  ALBUMIN 2.9*   No results found for this basename: LIPASE, AMYLASE,  in the last 168 hours No results found for this basename: AMMONIA,  in the last 168 hours CBC:  Recent Labs Lab 02/01/14 1307 02/02/14 0625 02/02/14 2329  WBC 10.7* 11.5* 16.7*  NEUTROABS  --   --  13.5*  HGB 12.4 10.9* 11.1*  HCT 36.4 32.3* 32.2*  MCV 88.8 88.3 88.2  PLT 302 241 301   Cardiac Enzymes: No results found for this basename: CKTOTAL, CKMB, CKMBINDEX, TROPONINI,  in the last 168 hours BNP (last 3 results)  Recent Labs  02/01/14 1307  PROBNP 1615.0*   CBG:  Recent Labs Lab 02/02/14 1649 02/02/14  2137 02/02/14 2350 02/03/14 0450 02/03/14 0758  GLUCAP 468* 419* 321* 183* 497*    No results found for this or any previous visit (from the past 240 hour(s)).   Studies: Dg Chest 2 View  02/01/2014   CLINICAL DATA:  CHF  EXAM: CHEST  2 VIEW  COMPARISON:  DG CHEST 2 VIEW dated 09/27/2013  FINDINGS: Normal heart size. Hazy right middle lobe airspace disease. Left lung grossly clear. Low volumes. No pneumothorax.  IMPRESSION: Right middle lobe airspace disease. Follow-up studies until resolution are recommended.   Electronically Signed   By: Maryclare Bean M.D.   On: 02/01/2014 14:01    Scheduled Meds: . aspirin EC  81 mg Oral Daily  . carvedilol  6.25 mg Oral BID WC  . cefTRIAXone (ROCEPHIN)  IV  1 g Intravenous Q24H  . clopidogrel  75 mg Oral Q breakfast  . furosemide  40  mg Oral Daily  . glipiZIDE  10 mg Oral Q breakfast  . insulin aspart  0-20 Units Subcutaneous TID WC  . insulin aspart  0-5 Units Subcutaneous QHS  . insulin aspart  10 Units Subcutaneous TID WC  . insulin detemir  10 Units Subcutaneous QHS  . ipratropium-albuterol  3 mL Nebulization TID  . levothyroxine  50 mcg Oral QAC breakfast  . lisinopril  10 mg Oral Daily  . pentoxifylline  400 mg Oral BID  . predniSONE  40 mg Oral BID  . Rivaroxaban  20 mg Oral Q supper  . sodium chloride  3 mL Intravenous Q12H   Continuous Infusions:   Time spent: 35 minutes  [facility] Pager [phone]. If 7PM-7AM, please contact night-coverage at [email or web address], password [facility] 02/03/2014, 8:52 AM  LOS: 2 days

## 2014-02-04 LAB — BASIC METABOLIC PANEL
BUN: 29 mg/dL — AB (ref 6–23)
CHLORIDE: 99 meq/L (ref 96–112)
CO2: 24 mEq/L (ref 19–32)
Calcium: 8.3 mg/dL — ABNORMAL LOW (ref 8.4–10.5)
Creatinine, Ser: 0.78 mg/dL (ref 0.50–1.10)
GFR calc Af Amer: >90 mL/min (ref >90)
GFR calc non Af Amer: >90 mL/min (ref >90)
GLUCOSE: 170 mg/dL — AB (ref 70–99)
POTASSIUM: 4.1 meq/L (ref 3.7–5.3)
Sodium: 138 mEq/L (ref 137–147)

## 2014-02-04 LAB — GLUCOSE, CAPILLARY
GLUCOSE-CAPILLARY: 118 mg/dL — AB (ref 70–99)
GLUCOSE-CAPILLARY: 132 mg/dL — AB (ref 70–99)
GLUCOSE-CAPILLARY: 167 mg/dL — AB (ref 70–99)
GLUCOSE-CAPILLARY: 178 mg/dL — AB (ref 70–99)
GLUCOSE-CAPILLARY: 209 mg/dL — AB (ref 70–99)
GLUCOSE-CAPILLARY: 293 mg/dL — AB (ref 70–99)
GLUCOSE-CAPILLARY: 405 mg/dL — AB (ref 70–99)
Glucose-Capillary: 161 mg/dL — ABNORMAL HIGH (ref 70–99)
Glucose-Capillary: 146 mg/dL — ABNORMAL HIGH (ref 70–99)

## 2014-02-04 MED ORDER — INSULIN GLARGINE 100 UNIT/ML ~~LOC~~ SOLN
10.0000 [IU] | Freq: Every day | SUBCUTANEOUS | Status: DC
  Administered 2014-02-04: 10 [IU] via SUBCUTANEOUS
  Filled 2014-02-04 (×2): qty 0.1

## 2014-02-04 MED ORDER — DEXTROSE 50 % IV SOLN: 25.0000 mL | INTRAVENOUS | Status: DC | PRN

## 2014-02-04 MED ORDER — INSULIN ASPART 100 UNIT/ML ~~LOC~~ SOLN
0.0000 [IU] | Freq: Three times a day (TID) | SUBCUTANEOUS | Status: DC
  Administered 2014-02-04: 18 [IU] via SUBCUTANEOUS
  Administered 2014-02-04: 5 [IU] via SUBCUTANEOUS
  Administered 2014-02-04: 3 [IU] via SUBCUTANEOUS
  Administered 2014-02-05: 8 [IU] via SUBCUTANEOUS
  Administered 2014-02-05: 3 [IU] via SUBCUTANEOUS

## 2014-02-04 MED ORDER — DEXTROSE 50 % IV SOLN: 50.0000 mL | INTRAVENOUS | Status: DC | PRN

## 2014-02-04 MED ORDER — GLUCOSE 40 % PO GEL: 1.0000 | ORAL | Status: DC | PRN

## 2014-02-04 MED ORDER — DEXTROSE 50 % IV SOLN: 50.0000 mL | Freq: Once | INTRAVENOUS | Status: DC | PRN

## 2014-02-04 MED ORDER — DEXTROSE 5 % IV SOLN: INTRAVENOUS | Status: DC

## 2014-02-04 MED ORDER — ACETAMINOPHEN 325 MG PO TABS
650.0000 mg | ORAL_TABLET | Freq: Four times a day (QID) | ORAL | Status: DC | PRN
  Administered 2014-02-04: 650 mg via ORAL
  Filled 2014-02-04: qty 2

## 2014-02-04 MED ORDER — INSULIN ASPART 100 UNIT/ML ~~LOC~~ SOLN
3.0000 [IU] | Freq: Three times a day (TID) | SUBCUTANEOUS | Status: DC
  Administered 2014-02-05 (×2): 3 [IU] via SUBCUTANEOUS

## 2014-02-04 MED ORDER — NICOTINE 21 MG/24HR TD PT24
21.0000 mg | MEDICATED_PATCH | Freq: Every day | TRANSDERMAL | Status: DC
  Administered 2014-02-04 – 2014-02-05 (×2): 21 mg via TRANSDERMAL
  Filled 2014-02-04 (×2): qty 1

## 2014-02-04 MED ORDER — INSULIN GLARGINE 100 UNIT/ML ~~LOC~~ SOLN
15.0000 [IU] | Freq: Every day | SUBCUTANEOUS | Status: DC
  Administered 2014-02-04: 15 [IU] via SUBCUTANEOUS
  Filled 2014-02-04 (×2): qty 0.15

## 2014-02-04 MED ORDER — INSULIN ASPART 100 UNIT/ML ~~LOC~~ SOLN
0.0000 [IU] | Freq: Every day | SUBCUTANEOUS | Status: DC
  Administered 2014-02-04: 3 [IU] via SUBCUTANEOUS

## 2014-02-04 NOTE — Progress Notes (Signed)
Patient's cbg has been within the target range for several hours: 2300 - cbg 159, 0000 - cbg 178, 0100 - cbg 161, 0200 - cbg 146, 0300 - cbg 118.  New orders for insulin SQ received.  Glucose stabilizer stopped at 0322.  Patient is resting and has no complaints at this time.

## 2014-02-04 NOTE — Progress Notes (Signed)
CRITICAL VALUE ALERT  Critical value received:  Glucose  Date of notification:  02/03/14  Time of notification:  0930  Critical value read back:yes  Nurse who received alert:  Bernadene Person  MD notified (1st page):  Dr. Conley Canal  Time of first page:  0940  MD notified (2nd page):  Time of second page:  Responding MD:  Dr. Conley Canal  Time MD responded:  [phone]

## 2014-02-04 NOTE — Progress Notes (Signed)
Ambulated patient from room to nurses station and back on room air. O2 sats ranged from 94-98%.  At rest on room air patient O2 sats ranged from  94-100%

## 2014-02-04 NOTE — Progress Notes (Signed)
Inpatient Diabetes Program Recommendations  AACE/ADA: New Consensus Statement on Inpatient Glycemic Control (2013)  Target Ranges:  Prepandial:   less than 140 mg/dL      Peak postprandial:   less than 180 mg/dL (1-2 hours)      Critically ill patients:  140 - 180 mg/dL   Reason for Visit: Hyperglycema  "I gave myself insulin in my stomach." Pt happy to report giving insulin injection. Long discussion regarding importance of glucose monitoring, taking meds as prescribed and f/u with PCP with logbook for management of diabetes. Discussed risk of smoking with diabetes. Pt said she has the patch. Discussed HgbA1C results and how to reduce to goal of 7.0%. Interested in attending OP Diabetes Education classes. Will order same. Encouraged pt to view diabetes videos on pt ed channel and pt agreed to. Discussed hypoglycemia s/s and treatment. Pt will be living alone. Does not have any problems in getting medications. Has meter, strips and lancets at home.   Recommendation: Consider addition of Novolog 3 units tidwc for meal coverage insulin while on Prednisone.  Will continue to follow. Thank you. Lorenda Peck, RD, LDN, CDE Inpatient Diabetes Coordinator [phone]

## 2014-02-04 NOTE — Progress Notes (Signed)
[facility] PROGRESS NOTE  PANG GODINHO [id number] DOB: June 18, 1961 DOA: 02/01/2014 PCP: Angelica Chessman, MD  Assessment/Plan:     Acute respiratory failure: off O2  Pneumococcal pneumonia: continue rocephin.   COPD with acute exacerbation: wean steroids quickly as able due to difficult to control DM. Still fairy wheezy     H/o Atrial fibrillation on xarelto    Tobacco abuse: 2PPD. Needs to quit.  Denies recent illicit drugs -add patch    Long term (current) use of anticoagulants   Hypothyroidism   Atherosclerotic peripheral vascular disease with ulceration on trental    DM type 2, uncontrolled, with neuropathy: add lantus and SSI- needs education on how to give  Constipation: miralax. Insomnia: benadryl Obesity Diabetic neuropathy: pt reports medicaid does not cover lyrica. Would like to try gabapentin  Code Status:  full Family Communication:   Disposition Plan:  home  HPI/Subjective: Has not been taught how to give lantus   Objective: Filed Vitals:   02/04/14 0619  BP: 135/65  Pulse: 82  Temp: 98.9 F (37.2 C)  Resp: 18    Intake/Output Summary (Last 24 hours) at 02/04/14 1020 Last data filed at 02/04/14 0800  Gross per 24 hour  Intake 675.83 ml  Output      1 ml  Net 674.83 ml   Filed Weights   02/01/14 1312 02/01/14 1908  Weight: 92.08 kg (203 lb) 92.08 kg (203 lb)    Exam:   General:  Up visiting with family- no increased work of breathing  Cardiovascular: RRR without MGR  Respiratory: fw wheezes  Abdomen: S, NT, ND  Ext: no CCE  Basic Metabolic Panel:  Recent Labs Lab 02/01/14 1307 02/01/14 2038 02/02/14 0625 02/02/14 2329 02/03/14 0920 02/04/14 0719  NA 138  --  136* 135* 136* 138  K 4.2  --  4.5 3.7 4.0 4.1  CL 95*  --  96 94* 95* 99  CO2 25  --  22 25 25 24   GLUCOSE 204*  --  353* 379* 616* 170*  BUN 15  --  23 33* 29* 29*  CREATININE 0.71  --  0.66 0.90 0.72 0.78  CALCIUM 9.3  --  8.9 9.2 8.7 8.3*  MG   --  1.5  --  1.9  --   --   PHOS  --  3.9  --   --   --   --    Liver Function Tests:  Recent Labs Lab 02/02/14 2329  AST 10  ALT 14  ALKPHOS 109  BILITOT <0.2*  PROT 7.2  ALBUMIN 2.9*   No results found for this basename: LIPASE, AMYLASE,  in the last 168 hours No results found for this basename: AMMONIA,  in the last 168 hours CBC:  Recent Labs Lab 02/01/14 1307 02/02/14 0625 02/02/14 2329  WBC 10.7* 11.5* 16.7*  NEUTROABS  --   --  13.5*  HGB 12.4 10.9* 11.1*  HCT 36.4 32.3* 32.2*  MCV 88.8 88.3 88.2  PLT 302 241 301   Cardiac Enzymes: No results found for this basename: CKTOTAL, CKMB, CKMBINDEX, TROPONINI,  in the last 168 hours BNP (last 3 results)  Recent Labs  02/01/14 1307  PROBNP 1615.0*   CBG:  Recent Labs Lab 02/04/14 0009 02/04/14 0108 02/04/14 0204 02/04/14 0306 02/04/14 0557  GLUCAP 178* 161* 146* 118* 167*    No results found for this or any previous visit (from the past 240 hour(s)).   Studies: No results found.  Scheduled  Meds: . aspirin EC  81 mg Oral Daily  . carvedilol  6.25 mg Oral BID WC  . cefTRIAXone (ROCEPHIN)  IV  1 g Intravenous Q24H  . clopidogrel  75 mg Oral Q breakfast  . furosemide  40 mg Oral Daily  . gabapentin  300 mg Oral QHS  . glipiZIDE  10 mg Oral Q breakfast  . insulin aspart  0-15 Units Subcutaneous TID WC  . insulin aspart  0-5 Units Subcutaneous QHS  . insulin glargine  10 Units Subcutaneous QHS  . ipratropium-albuterol  3 mL Nebulization TID  . levothyroxine  50 mcg Oral QAC breakfast  . lisinopril  10 mg Oral Daily  . nicotine  21 mg Transdermal Daily  . pentoxifylline  400 mg Oral BID  . polyethylene glycol  17 g Oral Daily  . predniSONE  40 mg Oral Q breakfast  . Rivaroxaban  20 mg Oral Q supper  . sodium chloride  3 mL Intravenous Q12H   Continuous Infusions:   Time spent: 35 minutes  Oceane Fosse  [facility] Pager [phone]. If 7PM-7AM, please contact night-coverage at  [email or web address], password [facility] 02/04/2014, 10:20 AM  LOS: 3 days

## 2014-02-05 DIAGNOSIS — E1159 Type 2 diabetes mellitus with other circulatory complications: Secondary | ICD-10-CM

## 2014-02-05 LAB — GLUCOSE, CAPILLARY
Glucose-Capillary: 196 mg/dL — ABNORMAL HIGH (ref 70–99)
Glucose-Capillary: 281 mg/dL — ABNORMAL HIGH (ref 70–99)

## 2014-02-05 MED ORDER — INSULIN ASPART 100 UNIT/ML ~~LOC~~ SOLN: 4.0000 [IU] | Freq: Three times a day (TID) | SUBCUTANEOUS | Status: DC

## 2014-02-05 MED ORDER — PREDNISONE 20 MG PO TABS
30.0000 mg | ORAL_TABLET | Freq: Every day | ORAL | Status: DC
  Filled 2014-02-05: qty 1

## 2014-02-05 MED ORDER — LISINOPRIL 20 MG PO TABS: 20.0000 mg | ORAL_TABLET | Freq: Every day | ORAL | Status: DC

## 2014-02-05 MED ORDER — NICOTINE 21 MG/24HR TD PT24: 21.0000 mg | MEDICATED_PATCH | Freq: Every day | TRANSDERMAL | Status: DC

## 2014-02-05 MED ORDER — GABAPENTIN 300 MG PO CAPS: 300.0000 mg | ORAL_CAPSULE | Freq: Every day | ORAL | Status: DC

## 2014-02-05 MED ORDER — INSULIN GLARGINE 100 UNIT/ML ~~LOC~~ SOLN: 15.0000 [IU] | Freq: Every day | SUBCUTANEOUS | Status: DC

## 2014-02-05 MED ORDER — PREDNISONE 10 MG PO TABS: ORAL_TABLET | ORAL | Status: DC

## 2014-02-05 NOTE — Progress Notes (Signed)
Inpatient Diabetes Program Recommendations  AACE/ADA: New Consensus Statement on Inpatient Glycemic Control (2013)  Target Ranges:  Prepandial:   less than 140 mg/dL      Peak postprandial:   less than 180 mg/dL (1-2 hours)      Critically ill patients:  140 - 180 mg/dL   Pt getting ready for discharge. Pt states she feels comfortable giving herself insulin at home. Will be staying with her sister for awhile. States she needs better glasses to see the lines on the insulin syringe. Encouraged her to get friend/family member to double check the doseage. Discussed drawing up insulin ahead of time and keeping in refrigerator until needed. Will f/u with PCP for diabetes management. Instructed to take glucose logbook to MD office for any needed adjustments. Again reviewed hypoglycemia s/s and treatment. Pt states she feels motivated to eat healthier, exercise and take meds as prescribed. Has OP Diabetes education consult.   Lorenda Peck, RD, LDN, CDE Inpatient Diabetes Program [phone]

## 2014-02-05 NOTE — Discharge Summary (Signed)
Physician Discharge Summary  [name] [id number] DOB: [birth year] DOA: 02/01/2014  PCP: Angelica Chessman, MD  Admit date: 02/01/2014 Discharge date: 02/06/2014  Time spent: 35 minutes  Recommendations for Outpatient Follow-up:  Encourage smoking cessation Outpatient diabetic education Monitor BP outpatient- may need titration of meds  Discharge Diagnoses:  Principal Problem:   Acute respiratory failure Active Problems:   COPD (chronic obstructive pulmonary disease)   Atrial fibrillation   Tobacco abuse   Long term (current) use of anticoagulants   Hypothyroidism   Atherosclerotic peripheral vascular disease with ulceration   CAP (community acquired pneumonia)   COPD with acute exacerbation   Pneumococcal pneumonia   DM type 2, uncontrolled, with neuropathy   Unspecified constipation   Insomnia   Discharge Condition: improved  Diet recommendation: cardiac/diabetic  Filed Weights   02/01/14 1312 02/01/14 1908  Weight: 92.08 kg (203 lb) 92.08 kg (203 lb)    History of present illness:  [name] is a 53 y.o. female  With history of peripheral vascular disease with recent evaluation with Doppler with findings suggestive of critical limb ischemia with an occluded SFA and popliteal arteries bilateral with tibial vessel disease and recent turbo hot directional atherectomy of her right SFA with an excellent angiographic result on 10 2014. Also with a history of COPD, hypothyroidism, tobacco abuse with prior history of illicit drug use per EMR. States that for the last 3 days she has been more short of breath. Nothing she is aware of makes it better. She did try her inhalers with minimal relief. She denies any sick contacts or hemoptysis.  In the ED was found to be hypoxic to 87% on room air with activity. And chest x-ray reported right middle lobe airspace disease. Subsequently we were consulted for further evaluation and recommendations.   Hospital Course:  Acute  respiratory failure: off O2   Pneumococcal pneumonia: treated with rocephin  COPD with acute exacerbation: wean steroids quickly as able due to difficult to control DM. Feeling better  H/o Atrial fibrillation on xarelto   Tobacco abuse: 2PPD. Needs to quit. Denies recent illicit drugs  -add patch   Long term (current) use of anticoagulants   Hypothyroidism   Atherosclerotic peripheral vascular disease with ulceration on trental   DM type 2, uncontrolled, with neuropathy: add lantus and SSI- needs education on how to give   Constipation: miralax.   Insomnia: benadryl   Obesity   Diabetic neuropathy: pt reports medicaid does not cover lyrica. Would like to try gabapentin  HTN- increased lisinopril   Procedures:     Discharge Exam: Filed Vitals:   02/05/14 1150  BP: 186/75  Pulse: 83  Temp: 98 F (36.7 C)  Resp: 18    General: A+Ox3, NAD Cardiovascular: rrr Respiratory: clear  Discharge Instructions      Discharge Orders   Future Orders Complete By Expires   Ambulatory referral to Nutrition and Diabetic Education  As directed    Diet - low sodium heart healthy  As directed    Diet Carb Modified  As directed    Discharge instructions  As directed    Comments:     Stop smoking Check blood sugars and bring to PCP 2x/day DO NOT TAKE SHORT ACTING NOVOLOG IF NOT EATING   Increase activity slowly  As directed        Medication List    STOP taking these medications       metFORMIN 1000 MG tablet  Commonly known as:  GLUCOPHAGE      TAKE these medications       albuterol 108 (90 BASE) MCG/ACT inhaler  Commonly known as:  PROVENTIL HFA;VENTOLIN HFA  Inhale 1-2 puffs into the lungs every 6 (six) hours as needed for wheezing or shortness of breath.     aspirin EC 81 MG tablet  Take 1 tablet (81 mg total) by mouth daily.     carvedilol 6.25 MG tablet  Commonly known as:  COREG  Take 1 tablet (6.25 mg total) by mouth 2 (two) times daily with a  meal.     clopidogrel 75 MG tablet  Commonly known as:  PLAVIX  Take 1 tablet (75 mg total) by mouth daily with breakfast.     furosemide 40 MG tablet  Commonly known as:  LASIX  Take 40 mg by mouth daily.     gabapentin 300 MG capsule  Commonly known as:  NEURONTIN  Take 1 capsule (300 mg total) by mouth at bedtime.     glipiZIDE 10 MG 24 hr tablet  Commonly known as:  GLUCOTROL XL  Take 10 mg by mouth daily with breakfast.     insulin aspart 100 UNIT/ML injection  Commonly known as:  novoLOG  Inject 4 Units into the skin 3 (three) times daily with meals.     insulin glargine 100 UNIT/ML injection  Commonly known as:  LANTUS  Inject 0.15 mLs (15 Units total) into the skin at bedtime.     ipratropium-albuterol 0.5-2.5 (3) MG/3ML Soln  Commonly known as:  DUONEB  Take 3 mLs by nebulization.     levothyroxine 50 MCG tablet  Commonly known as:  SYNTHROID, LEVOTHROID  Take 1 tablet (50 mcg total) by mouth daily.     lisinopril 20 MG tablet  Commonly known as:  PRINIVIL,ZESTRIL  Take 1 tablet (20 mg total) by mouth daily.     magnesium oxide 400 MG tablet  Commonly known as:  MAG-OX  Take 1 tablet (400 mg total) by mouth daily.     nicotine 21 mg/24hr patch  Commonly known as:  NICODERM CQ - dosed in mg/24 hours  Place 1 patch (21 mg total) onto the skin daily.     pentoxifylline 400 MG CR tablet  Commonly known as:  TRENTAL  Take 400 mg by mouth 2 (two) times daily.     predniSONE 10 MG tablet  Commonly known as:  DELTASONE  30 mg x 2 days, 20 mg x 3 days, 10 mg x 3 days and d/c     XARELTO 20 MG Tabs tablet  Generic drug:  Rivaroxaban  Take 20 mg by mouth every morning.       No Known Allergies    The results of significant diagnostics from this hospitalization (including imaging, microbiology, ancillary and laboratory) are listed below for reference.    Significant Diagnostic Studies: Dg Chest 2 View  02/01/2014   CLINICAL DATA:  CHF  EXAM: CHEST  2  VIEW  COMPARISON:  DG CHEST 2 VIEW dated 09/27/2013  FINDINGS: Normal heart size. Hazy right middle lobe airspace disease. Left lung grossly clear. Low volumes. No pneumothorax.  IMPRESSION: Right middle lobe airspace disease. Follow-up studies until resolution are recommended.   Electronically Signed   By: Maryclare Bean M.D.   On: 02/01/2014 14:01    Microbiology: No results found for this or any previous visit (from the past 240 hour(s)).   Labs: Basic Metabolic Panel:  Recent Labs Lab 02/01/14 1307 02/01/14  2038 02/02/14 0625 02/02/14 2329 02/03/14 0920 02/04/14 0719  NA 138  --  136* 135* 136* 138  K 4.2  --  4.5 3.7 4.0 4.1  CL 95*  --  96 94* 95* 99  CO2 25  --  22 25 25 24   GLUCOSE 204*  --  353* 379* 616* 170*  BUN 15  --  23 33* 29* 29*  CREATININE 0.71  --  0.66 0.90 0.72 0.78  CALCIUM 9.3  --  8.9 9.2 8.7 8.3*  MG  --  1.5  --  1.9  --   --   PHOS  --  3.9  --   --   --   --    Liver Function Tests:  Recent Labs Lab 02/02/14 2329  AST 10  ALT 14  ALKPHOS 109  BILITOT <0.2*  PROT 7.2  ALBUMIN 2.9*   No results found for this basename: LIPASE, AMYLASE,  in the last 168 hours No results found for this basename: AMMONIA,  in the last 168 hours CBC:  Recent Labs Lab 02/01/14 1307 02/02/14 0625 02/02/14 2329  WBC 10.7* 11.5* 16.7*  NEUTROABS  --   --  13.5*  HGB 12.4 10.9* 11.1*  HCT 36.4 32.3* 32.2*  MCV 88.8 88.3 88.2  PLT 302 241 301   Cardiac Enzymes: No results found for this basename: CKTOTAL, CKMB, CKMBINDEX, TROPONINI,  in the last 168 hours BNP: BNP (last 3 results)  Recent Labs  02/01/14 1307  PROBNP 1615.0*   CBG:  Recent Labs Lab 02/04/14 1201 02/04/14 1709 02/04/14 2131 02/05/14 0636 02/05/14 1108  GLUCAP 209* 405* 293* 196* 281*       Signed:  VANN, JESSICA  [facility] 02/06/2014, 12:30 PM

## 2014-02-19 ENCOUNTER — Other Ambulatory Visit: Payer: Self-pay | Admitting: Internal Medicine

## 2014-02-21 ENCOUNTER — Other Ambulatory Visit: Payer: Self-pay | Admitting: Internal Medicine

## 2014-02-24 ENCOUNTER — Encounter (HOSPITAL_COMMUNITY): Payer: Self-pay | Admitting: Emergency Medicine

## 2014-02-24 ENCOUNTER — Emergency Department (HOSPITAL_COMMUNITY)
Admission: EM | Admit: 2014-02-24 | Discharge: 2014-02-24 | Disposition: A | Discharge status: Home / Self Care | Payer: Medicaid Other | Attending: Emergency Medicine | Admitting: Emergency Medicine

## 2014-02-24 DIAGNOSIS — E669 Obesity, unspecified: Secondary | ICD-10-CM | POA: Insufficient documentation

## 2014-02-24 DIAGNOSIS — M129 Arthropathy, unspecified: Secondary | ICD-10-CM | POA: Insufficient documentation

## 2014-02-24 DIAGNOSIS — J441 Chronic obstructive pulmonary disease with (acute) exacerbation: Secondary | ICD-10-CM | POA: Insufficient documentation

## 2014-02-24 DIAGNOSIS — Z7902 Long term (current) use of antithrombotics/antiplatelets: Secondary | ICD-10-CM | POA: Insufficient documentation

## 2014-02-24 DIAGNOSIS — I4891 Unspecified atrial fibrillation: Secondary | ICD-10-CM | POA: Insufficient documentation

## 2014-02-24 DIAGNOSIS — Z8701 Personal history of pneumonia (recurrent): Secondary | ICD-10-CM | POA: Insufficient documentation

## 2014-02-24 DIAGNOSIS — Z794 Long term (current) use of insulin: Secondary | ICD-10-CM | POA: Insufficient documentation

## 2014-02-24 DIAGNOSIS — E039 Hypothyroidism, unspecified: Secondary | ICD-10-CM | POA: Insufficient documentation

## 2014-02-24 DIAGNOSIS — Z7901 Long term (current) use of anticoagulants: Secondary | ICD-10-CM | POA: Insufficient documentation

## 2014-02-24 DIAGNOSIS — Z7982 Long term (current) use of aspirin: Secondary | ICD-10-CM | POA: Insufficient documentation

## 2014-02-24 DIAGNOSIS — Z8719 Personal history of other diseases of the digestive system: Secondary | ICD-10-CM | POA: Insufficient documentation

## 2014-02-24 DIAGNOSIS — Z79899 Other long term (current) drug therapy: Secondary | ICD-10-CM | POA: Insufficient documentation

## 2014-02-24 DIAGNOSIS — E119 Type 2 diabetes mellitus without complications: Secondary | ICD-10-CM | POA: Insufficient documentation

## 2014-02-24 DIAGNOSIS — F17200 Nicotine dependence, unspecified, uncomplicated: Secondary | ICD-10-CM | POA: Insufficient documentation

## 2014-02-24 DIAGNOSIS — I509 Heart failure, unspecified: Secondary | ICD-10-CM | POA: Insufficient documentation

## 2014-02-24 DIAGNOSIS — R739 Hyperglycemia, unspecified: Secondary | ICD-10-CM

## 2014-02-24 DIAGNOSIS — R609 Edema, unspecified: Secondary | ICD-10-CM | POA: Insufficient documentation

## 2014-02-24 LAB — CBC
HCT: 35.7 % — ABNORMAL LOW (ref 36.0–46.0)
Hemoglobin: 12 g/dL (ref 12.0–15.0)
MCH: 30.4 pg (ref 26.0–34.0)
MCHC: 33.6 g/dL (ref 30.0–36.0)
MCV: 90.4 fL (ref 78.0–100.0)
PLATELETS: 258 10*3/uL (ref 150–400)
RBC: 3.95 MIL/uL (ref 3.87–5.11)
RDW: 13.3 % (ref 11.5–15.5)
WBC: 10.3 10*3/uL (ref 4.0–10.5)

## 2014-02-24 LAB — URINALYSIS, ROUTINE W REFLEX MICROSCOPIC
BILIRUBIN URINE: NEGATIVE
Hgb urine dipstick: NEGATIVE
Ketones, ur: NEGATIVE mg/dL
Leukocytes, UA: NEGATIVE
Nitrite: NEGATIVE
PH: 5.5 (ref 5.0–8.0)
Protein, ur: 30 mg/dL — AB
Specific Gravity, Urine: 1.028 (ref 1.005–1.030)
Urobilinogen, UA: 0.2 mg/dL (ref 0.0–1.0)

## 2014-02-24 LAB — COMPREHENSIVE METABOLIC PANEL
ALBUMIN: 3.1 g/dL — AB (ref 3.5–5.2)
ALT: 35 U/L (ref 0–35)
AST: 25 U/L (ref 0–37)
Alkaline Phosphatase: 150 U/L — ABNORMAL HIGH (ref 39–117)
BUN: 22 mg/dL (ref 6–23)
CO2: 26 meq/L (ref 19–32)
Calcium: 9.5 mg/dL (ref 8.4–10.5)
Chloride: 94 mEq/L — ABNORMAL LOW (ref 96–112)
Creatinine, Ser: 0.72 mg/dL (ref 0.50–1.10)
GFR calc Af Amer: >90 mL/min (ref >90)
GFR calc non Af Amer: >90 mL/min (ref >90)
Glucose, Bld: 370 mg/dL — ABNORMAL HIGH (ref 70–99)
Potassium: 4.1 mEq/L (ref 3.7–5.3)
Sodium: 137 mEq/L (ref 137–147)
Total Bilirubin: <0.2 mg/dL — ABNORMAL LOW (ref 0.3–1.2)
Total Protein: 6.8 g/dL (ref 6.0–8.3)

## 2014-02-24 LAB — CBG MONITORING, ED
Glucose-Capillary: 374 mg/dL — ABNORMAL HIGH (ref 70–99)
Glucose-Capillary: 180 mg/dL — ABNORMAL HIGH (ref 70–99)

## 2014-02-24 LAB — URINE MICROSCOPIC-ADD ON

## 2014-02-24 MED ORDER — INSULIN ASPART 100 UNIT/ML ~~LOC~~ SOLN
5.0000 [IU] | Freq: Once | SUBCUTANEOUS | Status: AC
  Administered 2014-02-24: 5 [IU] via INTRAVENOUS
  Filled 2014-02-24: qty 1

## 2014-02-24 MED ORDER — SODIUM CHLORIDE 0.9 % IV BOLUS (SEPSIS)
500.0000 mL | Freq: Once | INTRAVENOUS | Status: AC
  Administered 2014-02-24: 500 mL via INTRAVENOUS

## 2014-02-24 NOTE — Discharge Instructions (Signed)
Hyperglycemia °Hyperglycemia occurs when the glucose (sugar) in your blood is too high. Hyperglycemia can happen for many reasons, but it most often happens to people who do not know they have diabetes or are not managing their diabetes properly.  °CAUSES  °Whether you have diabetes or not, there are other causes of hyperglycemia. Hyperglycemia can occur when you have diabetes, but it can also occur in other situations that you might not be as aware of, such as: °Diabetes °· If you have diabetes and are having problems controlling your blood glucose, hyperglycemia could occur because of some of the following reasons: °· Not following your meal plan. °· Not taking your diabetes medications or not taking it properly. °· Exercising less or doing less activity than you normally do. °· Being sick. °Pre-diabetes °· This cannot be ignored. Before people develop Type 2 diabetes, they almost always have "pre-diabetes." This is when your blood glucose levels are higher than normal, but not yet high enough to be diagnosed as diabetes. Research has shown that some long-term damage to the body, especially the heart and circulatory system, may already be occurring during pre-diabetes. If you take action to manage your blood glucose when you have pre-diabetes, you may delay or prevent Type 2 diabetes from developing. °Stress °· If you have diabetes, you may be "diet" controlled or on oral medications or insulin to control your diabetes. However, you may find that your blood glucose is higher than usual in the hospital whether you have diabetes or not. This is often referred to as "stress hyperglycemia." Stress can elevate your blood glucose. This happens because of hormones put out by the body during times of stress. If stress has been the cause of your high blood glucose, it can be followed regularly by your caregiver. That way he/she can make sure your hyperglycemia does not continue to get worse or progress to  diabetes. °Steroids °· Steroids are medications that act on the infection fighting system (immune system) to block inflammation or infection. One side effect can be a rise in blood glucose. Most people can produce enough extra insulin to allow for this rise, but for those who cannot, steroids make blood glucose levels go even higher. It is not unusual for steroid treatments to "uncover" diabetes that is developing. It is not always possible to determine if the hyperglycemia will go away after the steroids are stopped. A special blood test called an A1c is sometimes done to determine if your blood glucose was elevated before the steroids were started. °SYMPTOMS °· Thirsty. °· Frequent urination. °· Dry mouth. °· Blurred vision. °· Tired or fatigue. °· Weakness. °· Sleepy. °· Tingling in feet or leg. °DIAGNOSIS  °Diagnosis is made by monitoring blood glucose in one or all of the following ways: °· A1c test. This is a chemical found in your blood. °· Fingerstick blood glucose monitoring. °· Laboratory results. °TREATMENT  °First, knowing the cause of the hyperglycemia is important before the hyperglycemia can be treated. Treatment may include, but is not be limited to: °· Education. °· Change or adjustment in medications. °· Change or adjustment in meal plan. °· Treatment for an illness, infection, etc. °· More frequent blood glucose monitoring. °· Change in exercise plan. °· Decreasing or stopping steroids. °· Lifestyle changes. °HOME CARE INSTRUCTIONS  °· Test your blood glucose as directed. °· Exercise regularly. Your caregiver will give you instructions about exercise. Pre-diabetes or diabetes which comes on with stress is helped by exercising. °· Eat wholesome,   balanced meals. Eat often and at regular, fixed times. Your caregiver or nutritionist will give you a meal plan to guide your sugar intake. °· Being at an ideal weight is important. If needed, losing as little as 10 to 15 pounds may help improve blood  glucose levels. °SEEK MEDICAL CARE IF:  °· You have questions about medicine, activity, or diet. °· You continue to have symptoms (problems such as increased thirst, urination, or weight gain). °SEEK IMMEDIATE MEDICAL CARE IF:  °· You are vomiting or have diarrhea. °· Your breath smells fruity. °· You are breathing faster or slower. °· You are very sleepy or incoherent. °· You have numbness, tingling, or pain in your feet or hands. °· You have chest pain. °· Your symptoms get worse even though you have been following your caregiver's orders. °· If you have any other questions or concerns. °Document Released: 04/26/2001 Document Revised: 01/23/2012 Document Reviewed: 02/27/2012 °[employer]® Patient Information ©2014 [employer]. ° °

## 2014-02-24 NOTE — ED Provider Notes (Signed)
CSN: [id number]     Arrival date & time 02/24/14  1633 History   First MD Initiated Contact with Patient 02/24/14 2018     Chief Complaint  Patient presents with  . Hyperglycemia     (Consider location/radiation/quality/duration/timing/severity/associated sxs/prior Treatment) Patient is a 53 y.o. female presenting with hyperglycemia. The history is provided by the patient. No language interpreter was used.  Hyperglycemia Blood sugar level PTA:  360's Severity:  Moderate Onset quality:  Unable to specify Duration:  3 weeks Timing:  Constant Progression:  Waxing and waning Chronicity:  New Diabetes status:  Controlled with insulin Current diabetic therapy:  Scheduled insulin Context: recent illness (recent pneumonia)   Context: not change in medication and not new diabetes diagnosis   Relieved by:  Nothing Ineffective treatments:  Insulin Associated symptoms: increased appetite, increased thirst and polyuria   Associated symptoms: no abdominal pain, no blurred vision, no chest pain, no confusion, no dehydration, no diaphoresis, no dysuria, no fatigue, no fever, no nausea, no shortness of breath, no vomiting, no weakness and no weight change   Risk factors: obesity     Past Medical History  Diagnosis Date  . COPD (chronic obstructive pulmonary disease)   . Alcohol abuse   . Narcotic abuse   . Cocaine abuse   . Marijuana abuse   . Poorly controlled diabetes mellitus   . COPD (chronic obstructive pulmonary disease)   . Tobacco abuse   . Heart failure   . Alcoholic cirrhosis   . Cardiomyopathy   . Peripheral arterial disease   . Obesity   . CHF (congestive heart failure)   . Hypothyroidism   . GERD (gastroesophageal reflux disease)   . Headache(784.0)     "@ least once or twice/wk" (09/10/2013)  . Arthritis     "hands, arms, shoulders, legs, back" (09/10/2013)  . Atrial fibrillation   . Critical lower limb ischemia   . Peripheral arterial disease    Past Surgical  History  Procedure Laterality Date  . Lower extremity angiogram Right 09/10/2013    turbo hawk directional arthrectomy of a diffusely diseased SFA/notes 09/10/2013   . Cardioversion  ~ 02/2013    "twice" (09/10/2013)   Family History  Problem Relation Age of Onset  . Hypertension Mother   . Diabetes Mother   . Cancer Mother     breast, ovarian, colon  . Hypertension Father    History  Substance Use Topics  . Smoking status: Current Every Day Smoker -- 1.50 packs/day for 38 years    Types: Cigarettes  . Smokeless tobacco: Never Used  . Alcohol Use: 0.6 oz/week    1 Cans of beer per week     Comment: 09/10/2013 "might drink 1 beer/wk; never had problem w/it"   OB History   Grav Para Term Preterm Abortions TAB SAB Ect Mult Living   1 1 1       1      Review of Systems  Constitutional: Negative for fever, chills, diaphoresis, activity change, appetite change and fatigue.  HENT: Negative for congestion, facial swelling, rhinorrhea and sore throat.   Eyes: Negative for blurred vision, photophobia and discharge.  Respiratory: Negative for cough, chest tightness and shortness of breath.   Cardiovascular: Negative for chest pain, palpitations and leg swelling.  Gastrointestinal: Negative for nausea, vomiting, abdominal pain and diarrhea.  Endocrine: Positive for polydipsia and polyuria.  Genitourinary: Negative for dysuria, frequency, difficulty urinating and pelvic pain.  Musculoskeletal: Negative for arthralgias, back pain, neck pain  and neck stiffness.  Skin: Negative for color change and wound.  Allergic/Immunologic: Negative for immunocompromised state.  Neurological: Negative for facial asymmetry, weakness, numbness and headaches.  Hematological: Does not bruise/bleed easily.  Psychiatric/Behavioral: Negative for confusion and agitation.      Allergies  Review of patient's allergies indicates no known allergies.  Home Medications   Current Outpatient Rx  Name  Route   Sig  Dispense  Refill  . albuterol (PROVENTIL HFA;VENTOLIN HFA) 108 (90 BASE) MCG/ACT inhaler   Inhalation   Inhale 1-2 puffs into the lungs every 6 (six) hours as needed for wheezing or shortness of breath.         [name] aspirin EC 81 MG tablet   Oral   Take 1 tablet (81 mg total) by mouth daily.   120 tablet   3   . carvedilol (COREG) 6.25 MG tablet   Oral   Take 1 tablet (6.25 mg total) by mouth 2 (two) times daily with a meal.   60 tablet   3   . clopidogrel (PLAVIX) 75 MG tablet   Oral   Take 1 tablet (75 mg total) by mouth daily with breakfast.   30 tablet   11   . furosemide (LASIX) 40 MG tablet   Oral   Take 40 mg by mouth daily.         [name] gabapentin (NEURONTIN) 300 MG capsule   Oral   Take 1 capsule (300 mg total) by mouth at bedtime.   30 capsule   0   . glipiZIDE (GLUCOTROL XL) 10 MG 24 hr tablet   Oral   Take 10 mg by mouth daily with breakfast.         . insulin aspart (NOVOLOG) 100 UNIT/ML injection   Subcutaneous   Inject 4 Units into the skin 3 (three) times daily with meals.   10 mL   11   . insulin glargine (LANTUS) 100 UNIT/ML injection   Subcutaneous   Inject 0.15 mLs (15 Units total) into the skin at bedtime.   10 mL   11   . ipratropium-albuterol (DUONEB) 0.5-2.5 (3) MG/3ML SOLN   Nebulization   Take 3 mLs by nebulization.         [name] levothyroxine (SYNTHROID, LEVOTHROID) 50 MCG tablet   Oral   Take 1 tablet (50 mcg total) by mouth daily.   120 tablet   3   . lisinopril (PRINIVIL,ZESTRIL) 20 MG tablet   Oral   Take 1 tablet (20 mg total) by mouth daily.   30 tablet   0   . magnesium oxide (MAG-OX) 400 MG tablet   Oral   Take 1 tablet (400 mg total) by mouth daily.   30 tablet   1   . pentoxifylline (TRENTAL) 400 MG CR tablet   Oral   Take 400 mg by mouth 2 (two) times daily.         . Rivaroxaban (XARELTO) 20 MG TABS tablet   Oral   Take 20 mg by mouth every morning.          . traMADol (ULTRAM) 50 MG tablet    Oral   Take 50 mg by mouth every 6 (six) hours as needed for moderate pain.          BP 150/66  Pulse 86  Temp(Src) 97.2 F (36.2 C) (Oral)  Resp 18  SpO2 99%  LMP 11/27/2012 Physical Exam  Constitutional: She is oriented to person, place, and time.  She appears well-developed and well-nourished. No distress.  HENT:  Head: Normocephalic and atraumatic.  Mouth/Throat: No oropharyngeal exudate.  Eyes: Pupils are equal, round, and reactive to light.  Neck: Normal range of motion. Neck supple.  Cardiovascular: Normal rate, regular rhythm and normal heart sounds.  Exam reveals no gallop and no friction rub.   No murmur heard. Pulmonary/Chest: Effort normal. No respiratory distress. She has no decreased breath sounds. She has wheezes in the right lower field and the left lower field. She has no rales.  Abdominal: Soft. Bowel sounds are normal. She exhibits no distension and no mass. There is no tenderness. There is no rebound and no guarding.  Musculoskeletal: Normal range of motion. She exhibits edema (1+ BLLE). She exhibits no tenderness.  Neurological: She is alert and oriented to person, place, and time.  Skin: Skin is warm and dry.  Psychiatric: She has a normal mood and affect.    ED Course  Procedures (including critical care time) Labs Review Labs Reviewed  CBC - Abnormal; Notable for the following:    HCT 35.7 (*)    All other components within normal limits  COMPREHENSIVE METABOLIC PANEL - Abnormal; Notable for the following:    Chloride 94 (*)    Glucose, Bld 370 (*)    Albumin 3.1 (*)    Alkaline Phosphatase 150 (*)    Total Bilirubin <0.2 (*)    All other components within normal limits  URINALYSIS, ROUTINE W REFLEX MICROSCOPIC - Abnormal; Notable for the following:    APPearance CLOUDY (*)    Glucose, UA >1000 (*)    Protein, ur 30 (*)    All other components within normal limits  URINE MICROSCOPIC-ADD ON - Abnormal; Notable for the following:    Squamous  Epithelial / LPF MANY (*)    Bacteria, UA FEW (*)    All other components within normal limits  CBG MONITORING, ED - Abnormal; Notable for the following:    Glucose-Capillary 374 (*)    All other components within normal limits  CBG MONITORING, ED - Abnormal; Notable for the following:    Glucose-Capillary 180 (*)    All other components within normal limits   Imaging Review No results found.   EKG Interpretation None      MDM   Final diagnoses:  Hyperglycemia    Pt is a 53 y.o. female with Pmhx as above who presents from PCP's office with hyperglycemia. She reports being elevated for about 3 weeks since being started on insulin (was on oral meds). Also reports polyuria, polydysia.  NO CP, SOB, cough, fever. AB 17 with glu 370 on labs w/ nml CO2. She has slight wheezing on lung fields. Abdominal exam benign. 500cc NS and 5U IV insulin given. I do not feel she is in DKA and FS repeated at 180. Will d/c home with return precautions given for new or worsening symptoms.        Neta Ehlers, MD 02/25/14 [id number]

## 2014-02-24 NOTE — ED Notes (Signed)
Pt was sent here from MD office for high blood sugar.  Pt takes insulin.  Pt reports increased thirst and urination.  Pt was recently hospitalized for pneumonia 3 weeks ago and has taken antibiotics

## 2014-03-11 ENCOUNTER — Encounter: Payer: Medicaid Other | Attending: Internal Medicine

## 2014-03-11 VITALS — Ht <= 58 in | Wt 204.9 lb

## 2014-03-11 DIAGNOSIS — E1140 Type 2 diabetes mellitus with diabetic neuropathy, unspecified: Secondary | ICD-10-CM

## 2014-03-11 DIAGNOSIS — E119 Type 2 diabetes mellitus without complications: Secondary | ICD-10-CM | POA: Insufficient documentation

## 2014-03-11 DIAGNOSIS — IMO0002 Reserved for concepts with insufficient information to code with codable children: Secondary | ICD-10-CM

## 2014-03-11 DIAGNOSIS — E1165 Type 2 diabetes mellitus with hyperglycemia: Secondary | ICD-10-CM

## 2014-03-11 DIAGNOSIS — Z713 Dietary counseling and surveillance: Secondary | ICD-10-CM | POA: Insufficient documentation

## 2014-03-12 ENCOUNTER — Other Ambulatory Visit (HOSPITAL_COMMUNITY): Payer: Self-pay | Admitting: Physician Assistant

## 2014-03-13 NOTE — Progress Notes (Signed)
Patient was seen on 03/11/14 for the first of a series of three diabetes self-management courses at the [facility].  Current HbA1c: 8.1%  The following learning objectives were met by the patient during this class:  Describe diabetes  State some common risk factors for diabetes  Defines the role of glucose and insulin  Identifies type of diabetes and pathophysiology  Describe the relationship between diabetes and cardiovascular risk  State the members of the Healthcare Team  States the rationale for glucose monitoring  State when to test glucose  State their individual Target Range  State the importance of logging glucose readings  Describe how to interpret glucose readings  Identifies A1C target  Explain the correlation between A1c and eAG values  State symptoms and treatment of high blood glucose  State symptoms and treatment of low blood glucose  Explain proper technique for glucose testing  Identifies proper sharps disposal  Handouts given during class include:  Living Well with Diabetes book  Carb Counting and Meal Planning book  Meal Plan Card  Carbohydrate guide  Meal planning worksheet  Low Sodium Flavoring Tips  The diabetes portion plate  U6N to eAG Conversion Chart  Diabetes Medications  Diabetes Recommended Care Schedule  Support Group  Diabetes Success Plan  Core Class Satisfaction Survey  Follow-Up Plan:  Attend core 2

## 2014-03-18 ENCOUNTER — Ambulatory Visit: Payer: Medicaid Other

## 2014-03-19 ENCOUNTER — Other Ambulatory Visit: Payer: Self-pay | Admitting: Internal Medicine

## 2014-03-20 ENCOUNTER — Encounter: Payer: Medicaid Other | Attending: Internal Medicine

## 2014-03-20 DIAGNOSIS — IMO0002 Reserved for concepts with insufficient information to code with codable children: Secondary | ICD-10-CM

## 2014-03-20 DIAGNOSIS — E1165 Type 2 diabetes mellitus with hyperglycemia: Secondary | ICD-10-CM

## 2014-03-20 DIAGNOSIS — E1140 Type 2 diabetes mellitus with diabetic neuropathy, unspecified: Secondary | ICD-10-CM

## 2014-03-20 DIAGNOSIS — Z713 Dietary counseling and surveillance: Secondary | ICD-10-CM | POA: Insufficient documentation

## 2014-03-20 DIAGNOSIS — E119 Type 2 diabetes mellitus without complications: Secondary | ICD-10-CM | POA: Insufficient documentation

## 2014-03-20 DIAGNOSIS — E1151 Type 2 diabetes mellitus with diabetic peripheral angiopathy without gangrene: Secondary | ICD-10-CM

## 2014-03-20 NOTE — Progress Notes (Signed)
Patient was seen on 03/20/2014 for the second of a series of three diabetes self-management courses at the [facility]. The following learning objectives were met by the patient during this class:   Describe the role of different macronutrients on glucose  Explain how carbohydrates affect blood glucose  State what foods contain the most carbohydrates  Demonstrate carbohydrate counting  Demonstrate how to read Nutrition Facts food label  Describe effects of various fats on heart health  Describe the importance of good nutrition for health and healthy eating strategies  Describe techniques for managing your shopping, cooking and meal planning  List strategies to follow meal plan when dining out  Describe the effects of alcohol on glucose and how to use it safely  Goals:  Follow Diabetes Meal Plan as instructed  Eat 3 meals and 2 snacks, every 3-5 hrs  Aim for carbohydrate intake of 30 grams carbohydrate/meal Aim for carbohydrate intake of 15 grams carbohydrate/snack Add lean protein foods to meals/snacks  Monitor glucose levels as instructed by your doctor   Follow-Up Plan:  Attend Core 3  Work towards following your personal food plan.

## 2014-03-27 ENCOUNTER — Ambulatory Visit: Payer: Medicaid Other

## 2014-03-27 DIAGNOSIS — E1151 Type 2 diabetes mellitus with diabetic peripheral angiopathy without gangrene: Secondary | ICD-10-CM

## 2014-03-27 NOTE — Progress Notes (Signed)
Patient was seen on 03/27/14 for the third of a series of three diabetes self-management courses at the [facility]. The following learning objectives were met by the patient during this class:    State the amount of activity recommended for healthy living   Describe activities suitable for individual needs   Identify ways to regularly incorporate activity into daily life   Identify barriers to activity and ways to over come these barriers  Identify diabetes medications being personally used and their primary action for lowering glucose and possible side effects   Describe role of stress on blood glucose and develop strategies to address psychosocial issues   Identify diabetes complications and ways to prevent them  Explain how to manage diabetes during illness   Evaluate success in meeting personal goal   Establish 2-3 goals that they will plan to diligently work on until they return for the  23-monthfollow-up visit  Goals:  Follow Diabetes Meal Plan as instructed  Aim for 15-30 mins of physical activity daily as tolerated  Bring food record and glucose log to your follow up visit  Your patient has established the following 4 month goals in their individualized success plan: I will count my carb choices at most meals and snacks I will increase my activity level at least 4 days a week I will take my diabetes medications as scheduled  Your patient has identified these potential barriers to change:  finances  Lack of family support  Your patient has identified their diabetes self-care support plan as  [facility]  Sister  Plan:  Attend Core 4 in 4 months

## 2014-05-22 IMAGING — CR DG CHEST 2V
2 series · 2 of 2 positions shown · non-contrast
Comparison: None.

CLINICAL DATA: Preoperative evaluation; hypertension

EXAM:
CHEST  2 VIEW

[w chest pa]
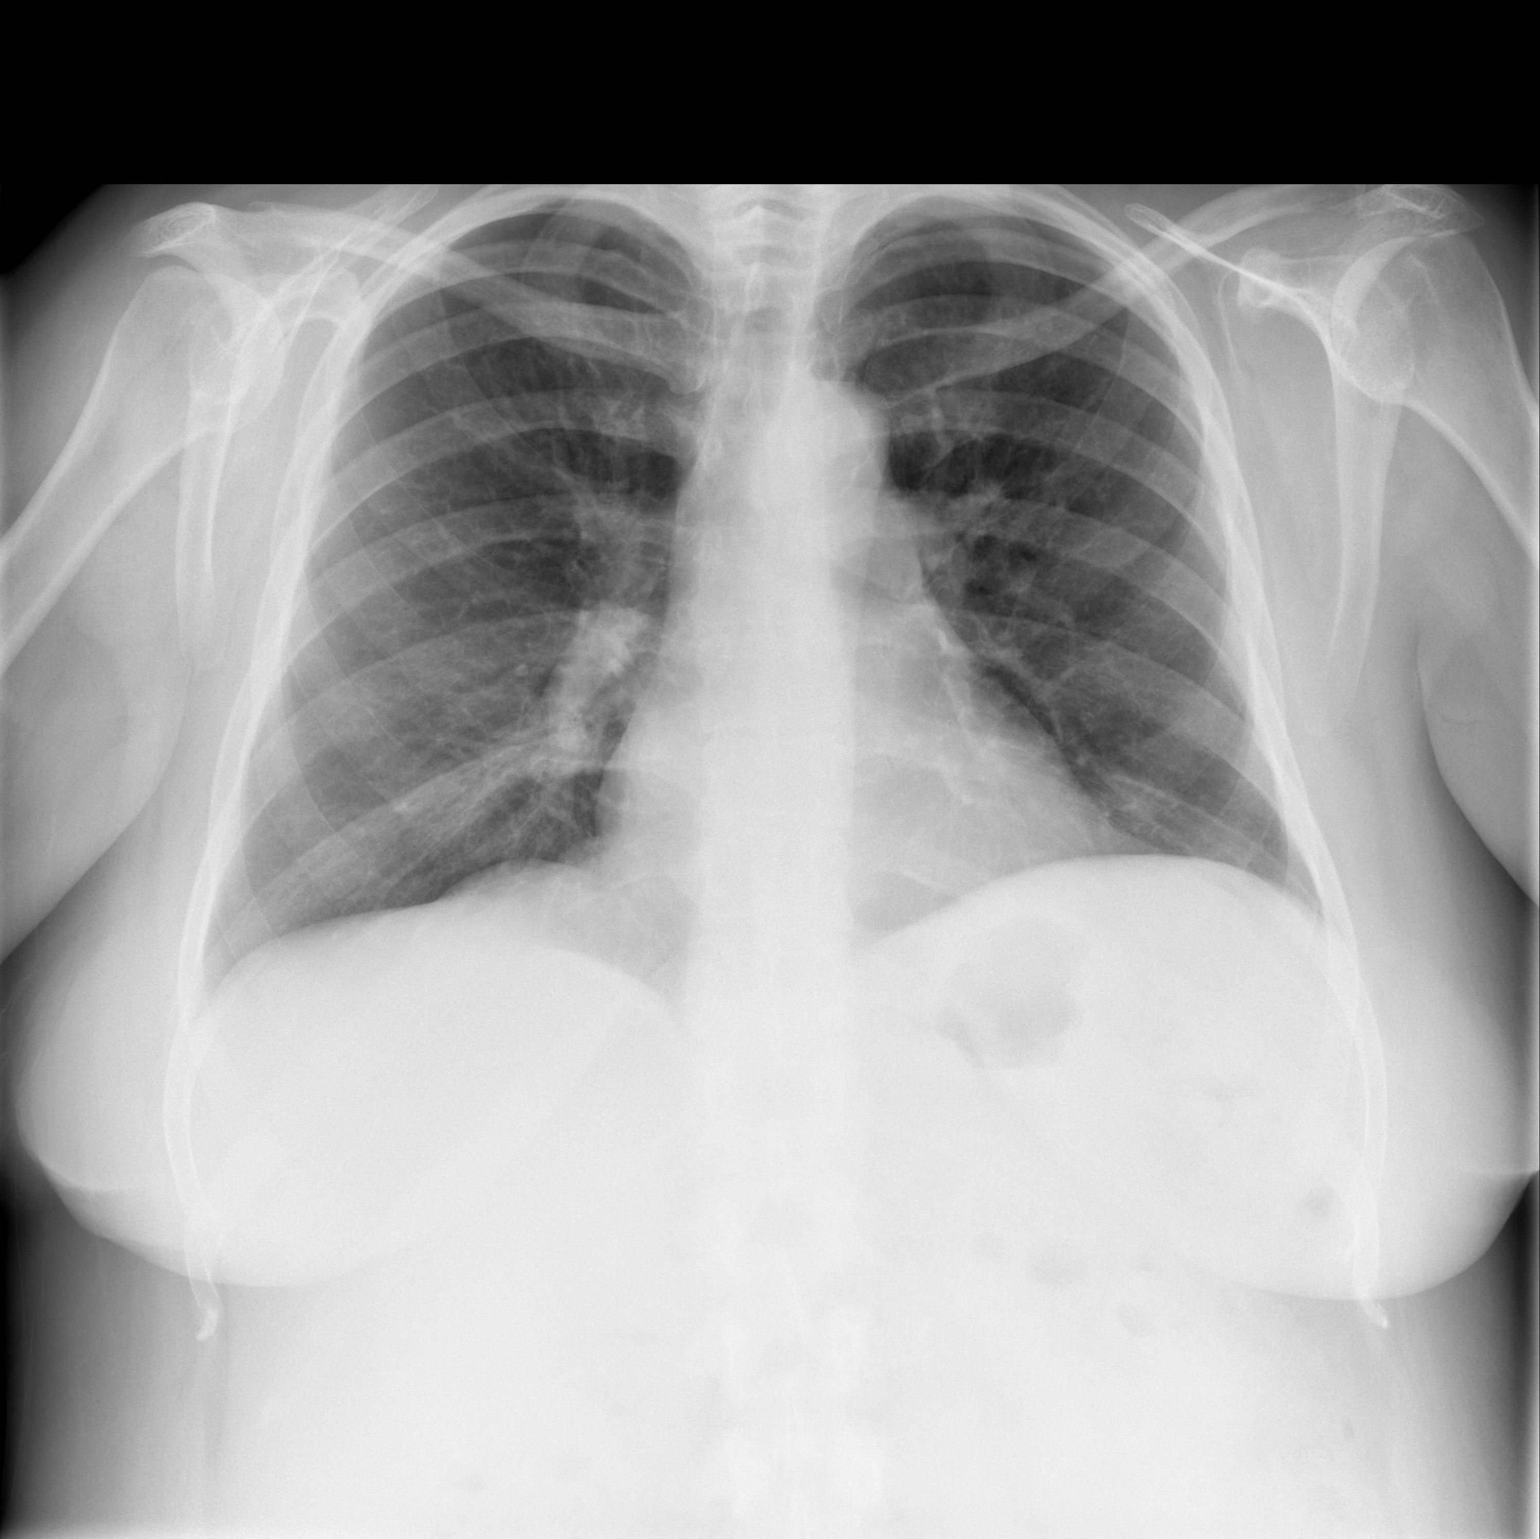

[w chest lat]
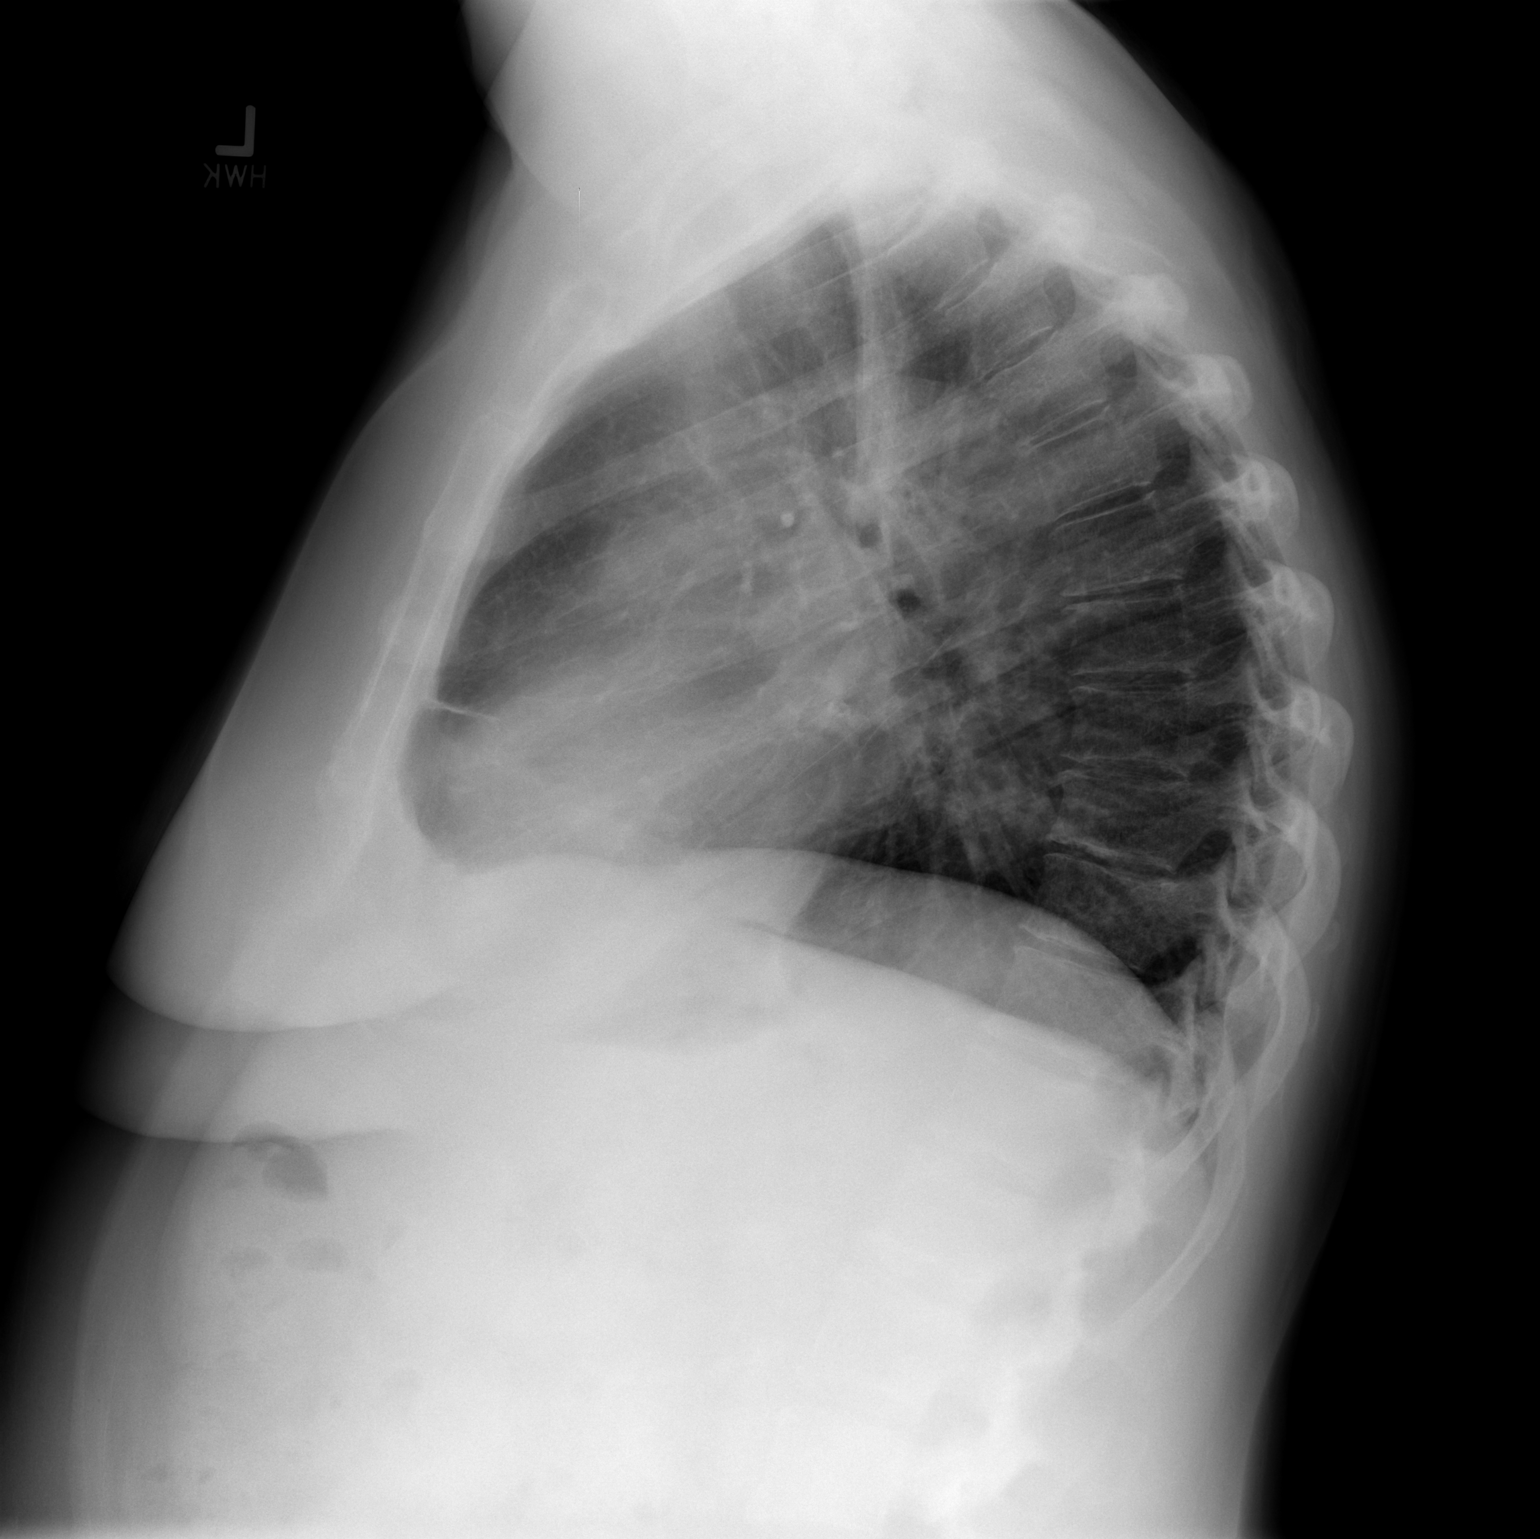

[2 of 2 positions shown; findings below may reference images not displayed]

FINDINGS: Lungs are clear. Heart size and pulmonary vascularity are normal. No
adenopathy. No bone lesions.
IMPRESSION: No edema or consolidation.

## 2014-06-06 ENCOUNTER — Encounter (HOSPITAL_COMMUNITY): Payer: Self-pay | Admitting: Emergency Medicine

## 2014-06-06 ENCOUNTER — Emergency Department (HOSPITAL_COMMUNITY)
Admission: EM | Admit: 2014-06-06 | Discharge: 2014-06-06 | Disposition: A | Discharge status: Home / Self Care | Payer: Medicaid Other | Attending: Emergency Medicine | Admitting: Emergency Medicine

## 2014-06-06 ENCOUNTER — Emergency Department (HOSPITAL_COMMUNITY): Payer: Medicaid Other

## 2014-06-06 DIAGNOSIS — Z7982 Long term (current) use of aspirin: Secondary | ICD-10-CM | POA: Diagnosis not present

## 2014-06-06 DIAGNOSIS — Z7901 Long term (current) use of anticoagulants: Secondary | ICD-10-CM | POA: Insufficient documentation

## 2014-06-06 DIAGNOSIS — E119 Type 2 diabetes mellitus without complications: Secondary | ICD-10-CM | POA: Insufficient documentation

## 2014-06-06 DIAGNOSIS — Z79899 Other long term (current) drug therapy: Secondary | ICD-10-CM | POA: Insufficient documentation

## 2014-06-06 DIAGNOSIS — R05 Cough: Secondary | ICD-10-CM | POA: Insufficient documentation

## 2014-06-06 DIAGNOSIS — K219 Gastro-esophageal reflux disease without esophagitis: Secondary | ICD-10-CM | POA: Insufficient documentation

## 2014-06-06 DIAGNOSIS — I4891 Unspecified atrial fibrillation: Secondary | ICD-10-CM | POA: Diagnosis not present

## 2014-06-06 DIAGNOSIS — M129 Arthropathy, unspecified: Secondary | ICD-10-CM | POA: Insufficient documentation

## 2014-06-06 DIAGNOSIS — E039 Hypothyroidism, unspecified: Secondary | ICD-10-CM | POA: Diagnosis not present

## 2014-06-06 DIAGNOSIS — E669 Obesity, unspecified: Secondary | ICD-10-CM | POA: Insufficient documentation

## 2014-06-06 DIAGNOSIS — R059 Cough, unspecified: Secondary | ICD-10-CM | POA: Insufficient documentation

## 2014-06-06 DIAGNOSIS — Z794 Long term (current) use of insulin: Secondary | ICD-10-CM | POA: Diagnosis not present

## 2014-06-06 DIAGNOSIS — F17200 Nicotine dependence, unspecified, uncomplicated: Secondary | ICD-10-CM | POA: Insufficient documentation

## 2014-06-06 DIAGNOSIS — J449 Chronic obstructive pulmonary disease, unspecified: Secondary | ICD-10-CM | POA: Diagnosis not present

## 2014-06-06 DIAGNOSIS — R739 Hyperglycemia, unspecified: Secondary | ICD-10-CM

## 2014-06-06 DIAGNOSIS — I509 Heart failure, unspecified: Secondary | ICD-10-CM | POA: Diagnosis not present

## 2014-06-06 DIAGNOSIS — J4489 Other specified chronic obstructive pulmonary disease: Secondary | ICD-10-CM | POA: Insufficient documentation

## 2014-06-06 LAB — CBC WITH DIFFERENTIAL/PLATELET
Basophils Absolute: 0 10*3/uL (ref 0.0–0.1)
Basophils Relative: 0 % (ref 0–1)
Eosinophils Absolute: 0.3 10*3/uL (ref 0.0–0.7)
Eosinophils Relative: 3 % (ref 0–5)
HEMATOCRIT: 34.2 % — AB (ref 36.0–46.0)
HEMOGLOBIN: 10.8 g/dL — AB (ref 12.0–15.0)
LYMPHS PCT: 23 % (ref 12–46)
Lymphs Abs: 2 10*3/uL (ref 0.7–4.0)
MCH: 28.4 pg (ref 26.0–34.0)
MCHC: 31.6 g/dL (ref 30.0–36.0)
MCV: 90 fL (ref 78.0–100.0)
MONO ABS: 0.6 10*3/uL (ref 0.1–1.0)
Monocytes Relative: 7 % (ref 3–12)
Neutro Abs: 5.9 10*3/uL (ref 1.7–7.7)
Neutrophils Relative %: 67 % (ref 43–77)
Platelets: 300 10*3/uL (ref 150–400)
RBC: 3.8 MIL/uL — AB (ref 3.87–5.11)
RDW: 13.6 % (ref 11.5–15.5)
WBC: 8.8 10*3/uL (ref 4.0–10.5)

## 2014-06-06 LAB — BASIC METABOLIC PANEL
Anion gap: 11 (ref 5–15)
BUN: 17 mg/dL (ref 6–23)
CHLORIDE: 97 meq/L (ref 96–112)
CO2: 26 meq/L (ref 19–32)
CREATININE: 0.76 mg/dL (ref 0.50–1.10)
Calcium: 8.7 mg/dL (ref 8.4–10.5)
GFR calc Af Amer: >90 mL/min (ref >90)
GFR calc non Af Amer: >90 mL/min (ref >90)
Glucose, Bld: 372 mg/dL — ABNORMAL HIGH (ref 70–99)
Potassium: 4.5 mEq/L (ref 3.7–5.3)
Sodium: 134 mEq/L — ABNORMAL LOW (ref 137–147)

## 2014-06-06 LAB — CBG MONITORING, ED
GLUCOSE-CAPILLARY: 305 mg/dL — AB (ref 70–99)
Glucose-Capillary: 337 mg/dL — ABNORMAL HIGH (ref 70–99)

## 2014-06-06 LAB — I-STAT TROPONIN, ED: Troponin i, poc: 0.01 ng/mL (ref 0.00–0.08)

## 2014-06-06 LAB — PRO B NATRIURETIC PEPTIDE: PRO B NATRI PEPTIDE: 980.5 pg/mL — AB (ref 0–125)

## 2014-06-06 MED ORDER — IPRATROPIUM-ALBUTEROL 0.5-2.5 (3) MG/3ML IN SOLN
3.0000 mL | Freq: Once | RESPIRATORY_TRACT | Status: AC
  Administered 2014-06-06: 3 mL via RESPIRATORY_TRACT
  Filled 2014-06-06: qty 3

## 2014-06-06 MED ORDER — PREDNISONE 20 MG PO TABS: 40.0000 mg | ORAL_TABLET | Freq: Every day | ORAL | Status: DC

## 2014-06-06 MED ORDER — SODIUM CHLORIDE 0.9 % IV BOLUS (SEPSIS)
1000.0000 mL | Freq: Once | INTRAVENOUS | Status: AC
  Administered 2014-06-06: 1000 mL via INTRAVENOUS

## 2014-06-06 MED ORDER — METHYLPREDNISOLONE SODIUM SUCC 125 MG IJ SOLR: 125.0000 mg | Freq: Once | INTRAMUSCULAR | Status: DC

## 2014-06-06 MED ORDER — GABAPENTIN 300 MG PO CAPS
300.0000 mg | ORAL_CAPSULE | Freq: Once | ORAL | Status: AC
  Administered 2014-06-06: 300 mg via ORAL
  Filled 2014-06-06: qty 1

## 2014-06-06 MED ORDER — ALBUTEROL SULFATE (2.5 MG/3ML) 0.083% IN NEBU
INHALATION_SOLUTION | RESPIRATORY_TRACT | Status: AC
  Administered 2014-06-06: 2.5 mg
  Filled 2014-06-06: qty 3

## 2014-06-06 NOTE — Discharge Instructions (Signed)
Take the prescribed medication as directed. Monitor your blood sugar closely over the next few days as prednisone may cause it to remain elevated and you may need to adjust your insulin. Follow-up with your primary care physician on Monday as previously scheduled. Return to the ED for new or worsening symptoms.

## 2014-06-06 NOTE — ED Notes (Signed)
Pt reports cough for 2 weeks. Denies SOB/chest pain. Pt states that her blood sugar is elevated as well. Pt has been seen by MD for same.

## 2014-06-06 NOTE — ED Provider Notes (Signed)
CSN: [id number]     Arrival date & time 06/06/14  1117 History   First MD Initiated Contact with Patient 06/06/14 1124     Chief Complaint  Patient presents with  . Cough  . Hyperglycemia     (Consider location/radiation/quality/duration/timing/severity/associated sxs/prior Treatment) Patient is a 53 y.o. female presenting with cough and hyperglycemia. The history is provided by the patient and medical records.  Cough Hyperglycemia  This is a 54 year old female with past medical history significant for COPD, CHF, hypothyroidism, AFIB, PAD, presenting to the ED for cough and hyperglycemia.  Pt states over the past several days her blood sugar has been elevated into the 500 range.  She has been taking all of her oral meds and insulin as directed.  States some intermittent nausea but no vomiting or diarrhea.  No polyuria or polydipsia.  CBG on arrival 337.  Patient also notes a recent productive cough with thick phlegm. She denies any fever or chills. She does have multiple sick contacts in her family. She denies any chest pain, palpitations, SOB, dizziness, weakness.  Pt is a daily smoker, at least 1 PPD.  VS stable on arrival.  Past Medical History  Diagnosis Date  . COPD (chronic obstructive pulmonary disease)   . Alcohol abuse   . Narcotic abuse   . Cocaine abuse   . Marijuana abuse   . Poorly controlled diabetes mellitus   . COPD (chronic obstructive pulmonary disease)   . Tobacco abuse   . Heart failure   . Alcoholic cirrhosis   . Cardiomyopathy   . Peripheral arterial disease   . Obesity   . CHF (congestive heart failure)   . Hypothyroidism   . GERD (gastroesophageal reflux disease)   . Headache(784.0)     "@ least once or twice/wk" (09/10/2013)  . Arthritis     "hands, arms, shoulders, legs, back" (09/10/2013)  . Atrial fibrillation   . Critical lower limb ischemia   . Peripheral arterial disease    Past Surgical History  Procedure Laterality Date  . Lower extremity  angiogram Right 09/10/2013    turbo hawk directional arthrectomy of a diffusely diseased SFA/notes 09/10/2013   . Cardioversion  ~ 02/2013    "twice" (09/10/2013)   Family History  Problem Relation Age of Onset  . Hypertension Mother   . Diabetes Mother   . Cancer Mother     breast, ovarian, colon  . Hypertension Father    History  Substance Use Topics  . Smoking status: Current Every Day Smoker -- 1.50 packs/day for 38 years    Types: Cigarettes  . Smokeless tobacco: Never Used  . Alcohol Use: 0.6 oz/week    1 Cans of beer per week     Comment: 09/10/2013 "might drink 1 beer/wk; never had problem w/it"   OB History   Grav Para Term Preterm Abortions TAB SAB Ect Mult Living   1 1 1       1      Review of Systems  Respiratory: Positive for cough.   Endocrine:       Hyperglycemia  All other systems reviewed and are negative.     Allergies  Review of patient's allergies indicates no known allergies.  Home Medications   Prior to Admission medications   Medication Sig Start Date End Date Taking? Authorizing Provider  ACCU-CHEK AVIVA PLUS test strip USE TO CHECK BLOOD SUGAR EVERY DAY    Angelica Chessman, MD  ACCU-CHEK SOFTCLIX LANCETS lancets USE TO CHECK BLOOD SUGAR  EVERY DAY    Angelica Chessman, MD  aspirin EC 81 MG tablet Take 1 tablet (81 mg total) by mouth daily. 07/31/13   Renella Cunas, MD  carvedilol (COREG) 6.25 MG tablet TAKE 1 TABLET BY MOUTH TWICE DAILY    Angelica Chessman, MD  clopidogrel (PLAVIX) 75 MG tablet Take 1 tablet (75 mg total) by mouth daily with breakfast. 09/11/13   Tarri Fuller, PA-C  furosemide (LASIX) 40 MG tablet TAKE 1 TABLET BY MOUTH ONCE DAILY    Angelica Chessman, MD  gabapentin (NEURONTIN) 300 MG capsule Take 1 capsule (300 mg total) by mouth at bedtime. 02/05/14   Jessica U Vann, DO  GLIPIZIDE XL 10 MG 24 hr tablet TAKE 1 TABLET BY MOUTH ONCE DAILY WITH BREAKFAST    Angelica Chessman, MD  insulin aspart (NOVOLOG) 100 UNIT/ML injection  Inject 4 Units into the skin 3 (three) times daily with meals. 02/05/14   Geradine Girt, DO  insulin glargine (LANTUS) 100 UNIT/ML injection Inject 0.15 mLs (15 Units total) into the skin at bedtime. 02/05/14   Geradine Girt, DO  ipratropium-albuterol (DUONEB) 0.5-2.5 (3) MG/3ML SOLN INHALE THE CONTENTS OF 1 VIAL VIA NEBULIZER FOUR TIMES A DAY AS NEEDED    Angelica Chessman, MD  levothyroxine (SYNTHROID, LEVOTHROID) 50 MCG tablet Take 1 tablet (50 mcg total) by mouth daily. 07/31/13   Renella Cunas, MD  lisinopril (PRINIVIL,ZESTRIL) 10 MG tablet TAKE 1/2 TABLET BY MOUTH EVERY NIGHT AT BEDTIME. HOLD IF BLOOD PRESSURE LESS THAN 100.    Angelica Chessman, MD  lisinopril (PRINIVIL,ZESTRIL) 20 MG tablet Take 1 tablet (20 mg total) by mouth daily. 02/06/14   Geradine Girt, DO  magnesium oxide (MAG-OX) 400 MG tablet TAKE 1 TABLET BY MOUTH ONCE DAILY    Angelica Chessman, MD  pentoxifylline (TRENTAL) 400 MG CR tablet TAKE 1 TABLET BY MOUTH TWICE DAILY WITH MEALS    Olugbemiga Jegede, MD  PROAIR HFA 108 (90 BASE) MCG/ACT inhaler INHALE 1-2 PUFFS BY MOUTH EVERY 4-6 HOURS AS NEEDED    Angelica Chessman, MD  Rivaroxaban (XARELTO) 20 MG TABS tablet Take 20 mg by mouth every morning.     Historical Provider, MD  traMADol (ULTRAM) 50 MG tablet Take 50 mg by mouth every 6 (six) hours as needed for moderate pain.    Historical Provider, MD   BP 179/86  Pulse 105  Temp(Src) 98.6 F (37 C)  Resp 18  SpO2 100%  LMP 11/27/2012  Physical Exam  Nursing note and vitals reviewed. Constitutional: She is oriented to person, place, and time. She appears well-developed and well-nourished.  HENT:  Head: Normocephalic and atraumatic.  Mouth/Throat: Oropharynx is clear and moist.  Eyes: Conjunctivae and EOM are normal. Pupils are equal, round, and reactive to light.  Neck: Normal range of motion.  Cardiovascular: Normal rate, regular rhythm and normal heart sounds.   Pulmonary/Chest: Effort normal and breath sounds  normal. No respiratory distress. She has no wheezes. She has no rhonchi.  Respirations unlabored, coarse breath sounds bilaterally, no distress  Abdominal: Soft. Bowel sounds are normal. There is no tenderness. There is no guarding.  Musculoskeletal: Normal range of motion. She exhibits no edema.  Neurological: She is alert and oriented to person, place, and time.  Skin: Skin is warm and dry.  Psychiatric: She has a normal mood and affect.    ED Course  Procedures (including critical care time) Labs Review Labs Reviewed  CBC WITH DIFFERENTIAL - Abnormal; Notable for the following:  RBC 3.80 (*)    Hemoglobin 10.8 (*)    HCT 34.2 (*)    All other components within normal limits  BASIC METABOLIC PANEL - Abnormal; Notable for the following:    Sodium 134 (*)    Glucose, Bld 372 (*)    All other components within normal limits  PRO B NATRIURETIC PEPTIDE - Abnormal; Notable for the following:    Pro B Natriuretic peptide (BNP) 980.5 (*)    All other components within normal limits  CBG MONITORING, ED - Abnormal; Notable for the following:    Glucose-Capillary 337 (*)    All other components within normal limits  CBG MONITORING, ED - Abnormal; Notable for the following:    Glucose-Capillary 305 (*)    All other components within normal limits  I-STAT TROPOININ, ED    Imaging Review Dg Chest 2 View  06/06/2014   CLINICAL DATA:  Cough.  Shortness of breath.  EXAM: CHEST  2 VIEW  COMPARISON:  Chest x-ray 02/01/2014.  FINDINGS: Lung volumes are normal. No consolidative airspace disease. No pleural effusions. No pneumothorax. No pulmonary nodule or mass noted. Pulmonary vasculature and the cardiomediastinal silhouette are within normal limits.  IMPRESSION: No radiographic evidence of acute cardiopulmonary disease.   Electronically Signed   By: Vinnie Langton M.D.   On: 06/06/2014 12:43     EKG Interpretation None      MDM   Final diagnoses:  Hyperglycemia  Cough    53 year old female with complaint of cough and hyperglycemia.  Reported CBGs in the 500 range over the past few days. On arrival, CBG is 337.  On exam she is afebrile and overall nontoxic appearing. She does have coarse lung sounds bilaterally, but no audible wheezes or rhonchi.  Her respirations are unlabored and she is in no acute distress. Obtain basic labs and chest x-ray. Fluid bolus given, pt has already had morning dose of insulin. Duoneb given as well.  After fluids CBG now 300.  Labs are reassuring with normal anion gap, no signs of DKA.  Chest x-ray clear without pulmonary vascular congestion or pulmonary edema. Patient does not appear significantly fluid overloaded on exam.. Duoneb with improvement of lung sounds, questionable bronchitis.  Pt remains without any chest pain or respiratory distress, VS stable on RA.  At this time i have low suspicion for ACS, PE (on xarelto making unlikely), dissection, or other acute cardiac event and feel she may be discharged home.  Given her smoking hx and COPD, will start on course of prednisone.  I have counseled her on monitor her CBG closely as prednisone may cause blood sugar to elevate even further and she may need to adjust her insulin.  Has previously scheduled FU with PCP on Monday.  Discussed plan with patient, he/she acknowledged understanding and agreed with plan of care.  Return precautions given for new or worsening symptoms.  Larene Pickett, PA-C 06/06/14 1724  Larene Pickett, PA-C 06/06/14 1725

## 2014-06-06 NOTE — Discharge Planning (Signed)
[place] to patient regarding primary care resources and the [facility] orange card. Pt states she is currently an established pt with Family Medicine @ [facility], also states her medicaid was recently terminated. Pt states she has an appointment in the upcoming week with her pcp.Pt was given the orange card application and instructions on how to obtain the orange card through her current pcp. Resource guide and my contact information also provided for any future questions or concerns. Spoke to case management Fortunato Curling. about pt possibly needing medication assistance. No other community liaison needs identified at this time.

## 2014-06-06 NOTE — Progress Notes (Signed)
  CARE MANAGEMENT ED NOTE 06/06/2014  Patient:  [name], [name]   Account Number:  [id number]  Date Initiated:  06/06/2014  Documentation initiated by:  Edwyna Shell  Subjective/Objective Assessment:   53 yo female presentng with cough and c/o pain     Subjective/Objective Assessment Detail:     Action/Plan:   Patient will follow up at [facility] appointment on Monday, July 27th and will apply for orange at this time also.   Action/Plan Detail:   Anticipated DC Date:       Status Recommendation to Physician:   Result of Recommendation:  Agreed    DC Planning Services  CM consult  Other    Choice offered to / List presented to:            Status of service:  Completed, signed off  ED Comments:   ED Comments Detail:  CM consulted for possible medication assistance. This CM spoke with the patient and she stated that she was a patient at [facility] and then received Medicaid and was switched to [facility]. She stated that she lost her Medicaid on the first of the month and she is not longer able to receive her medication refills through [place] mail order and will run out of her meds at the end of the month. The patient stated that she has an appointment at [facility] on Monday. The patient had an application for the orange card and was instructed to bring the completed application to [facility] appointment on Monday to obtain the orange card. The patient understands that the orange card will help with SS fee and use of the pharmacy. The patient stated that she will take her medication list to the appointment to receive refills on her current medications. The patient then stated that she has only one pain pill left. When questioned, she informed this CM that she is under contract at a Pain CLinic. This CM informed the patient that she is not eleigible to receive pain medications outside the pain clinic. Explained the [facility] $4 list. This CM updated the ED PA, Quincy Carnes with patient infromation and  requested that any discharge medications be prescribed from the $4 list otherwise if MATCH assist needed to contcat CM for further assist.

## 2014-06-07 NOTE — Progress Notes (Signed)
ED CM received call from [facility] concerning clarification of prednisone prescription. Clarified prescription, as per epic AVS .

## 2014-06-16 ENCOUNTER — Inpatient Hospital Stay (HOSPITAL_COMMUNITY)
Admission: EM | Admit: 2014-06-16 | Discharge: 2014-06-18 | DRG: 637 | Disposition: A | Discharge status: Home / Self Care | Payer: Medicaid Other | Attending: Internal Medicine | Admitting: Internal Medicine

## 2014-06-16 ENCOUNTER — Emergency Department (HOSPITAL_COMMUNITY): Payer: Medicaid Other

## 2014-06-16 ENCOUNTER — Other Ambulatory Visit: Payer: Self-pay

## 2014-06-16 ENCOUNTER — Encounter (HOSPITAL_COMMUNITY): Payer: Self-pay | Admitting: Emergency Medicine

## 2014-06-16 DIAGNOSIS — K5900 Constipation, unspecified: Secondary | ICD-10-CM

## 2014-06-16 DIAGNOSIS — K219 Gastro-esophageal reflux disease without esophagitis: Secondary | ICD-10-CM | POA: Diagnosis present

## 2014-06-16 DIAGNOSIS — F1010 Alcohol abuse, uncomplicated: Secondary | ICD-10-CM | POA: Diagnosis present

## 2014-06-16 DIAGNOSIS — E1169 Type 2 diabetes mellitus with other specified complication: Secondary | ICD-10-CM | POA: Diagnosis present

## 2014-06-16 DIAGNOSIS — I10 Essential (primary) hypertension: Secondary | ICD-10-CM | POA: Diagnosis present

## 2014-06-16 DIAGNOSIS — K7030 Alcoholic cirrhosis of liver without ascites: Secondary | ICD-10-CM | POA: Diagnosis present

## 2014-06-16 DIAGNOSIS — R0609 Other forms of dyspnea: Secondary | ICD-10-CM | POA: Diagnosis present

## 2014-06-16 DIAGNOSIS — E1159 Type 2 diabetes mellitus with other circulatory complications: Secondary | ICD-10-CM | POA: Diagnosis present

## 2014-06-16 DIAGNOSIS — G4700 Insomnia, unspecified: Secondary | ICD-10-CM

## 2014-06-16 DIAGNOSIS — I998 Other disorder of circulatory system: Secondary | ICD-10-CM | POA: Diagnosis present

## 2014-06-16 DIAGNOSIS — E1110 Type 2 diabetes mellitus with ketoacidosis without coma: Secondary | ICD-10-CM

## 2014-06-16 DIAGNOSIS — F1110 Opioid abuse, uncomplicated: Secondary | ICD-10-CM

## 2014-06-16 DIAGNOSIS — J449 Chronic obstructive pulmonary disease, unspecified: Secondary | ICD-10-CM

## 2014-06-16 DIAGNOSIS — I428 Other cardiomyopathies: Secondary | ICD-10-CM | POA: Diagnosis present

## 2014-06-16 DIAGNOSIS — F17200 Nicotine dependence, unspecified, uncomplicated: Secondary | ICD-10-CM | POA: Diagnosis present

## 2014-06-16 DIAGNOSIS — I798 Other disorders of arteries, arterioles and capillaries in diseases classified elsewhere: Secondary | ICD-10-CM | POA: Diagnosis present

## 2014-06-16 DIAGNOSIS — E1310 Other specified diabetes mellitus with ketoacidosis without coma: Principal | ICD-10-CM | POA: Diagnosis present

## 2014-06-16 DIAGNOSIS — Z7901 Long term (current) use of anticoagulants: Secondary | ICD-10-CM

## 2014-06-16 DIAGNOSIS — E039 Hypothyroidism, unspecified: Secondary | ICD-10-CM | POA: Diagnosis present

## 2014-06-16 DIAGNOSIS — IMO0002 Reserved for concepts with insufficient information to code with codable children: Secondary | ICD-10-CM

## 2014-06-16 DIAGNOSIS — Z794 Long term (current) use of insulin: Secondary | ICD-10-CM | POA: Diagnosis not present

## 2014-06-16 DIAGNOSIS — J441 Chronic obstructive pulmonary disease with (acute) exacerbation: Secondary | ICD-10-CM

## 2014-06-16 DIAGNOSIS — E119 Type 2 diabetes mellitus without complications: Secondary | ICD-10-CM | POA: Diagnosis present

## 2014-06-16 DIAGNOSIS — I509 Heart failure, unspecified: Secondary | ICD-10-CM | POA: Diagnosis present

## 2014-06-16 DIAGNOSIS — J189 Pneumonia, unspecified organism: Secondary | ICD-10-CM

## 2014-06-16 DIAGNOSIS — I5033 Acute on chronic diastolic (congestive) heart failure: Secondary | ICD-10-CM | POA: Diagnosis present

## 2014-06-16 DIAGNOSIS — Z91199 Patient's noncompliance with other medical treatment and regimen due to unspecified reason: Secondary | ICD-10-CM

## 2014-06-16 DIAGNOSIS — R600 Localized edema: Secondary | ICD-10-CM

## 2014-06-16 DIAGNOSIS — F1411 Cocaine abuse, in remission: Secondary | ICD-10-CM | POA: Diagnosis present

## 2014-06-16 DIAGNOSIS — I4891 Unspecified atrial fibrillation: Secondary | ICD-10-CM | POA: Diagnosis present

## 2014-06-16 DIAGNOSIS — R0789 Other chest pain: Secondary | ICD-10-CM | POA: Diagnosis present

## 2014-06-16 DIAGNOSIS — E1165 Type 2 diabetes mellitus with hyperglycemia: Secondary | ICD-10-CM

## 2014-06-16 DIAGNOSIS — I739 Peripheral vascular disease, unspecified: Secondary | ICD-10-CM | POA: Diagnosis present

## 2014-06-16 DIAGNOSIS — I429 Cardiomyopathy, unspecified: Secondary | ICD-10-CM

## 2014-06-16 DIAGNOSIS — I70209 Unspecified atherosclerosis of native arteries of extremities, unspecified extremity: Secondary | ICD-10-CM

## 2014-06-16 DIAGNOSIS — E8770 Fluid overload, unspecified: Secondary | ICD-10-CM

## 2014-06-16 DIAGNOSIS — Z720 Tobacco use: Secondary | ICD-10-CM

## 2014-06-16 DIAGNOSIS — M79605 Pain in left leg: Secondary | ICD-10-CM

## 2014-06-16 DIAGNOSIS — M79604 Pain in right leg: Secondary | ICD-10-CM

## 2014-06-16 DIAGNOSIS — J42 Unspecified chronic bronchitis: Secondary | ICD-10-CM

## 2014-06-16 DIAGNOSIS — Z7982 Long term (current) use of aspirin: Secondary | ICD-10-CM | POA: Diagnosis not present

## 2014-06-16 DIAGNOSIS — E1151 Type 2 diabetes mellitus with diabetic peripheral angiopathy without gangrene: Secondary | ICD-10-CM

## 2014-06-16 DIAGNOSIS — Z79899 Other long term (current) drug therapy: Secondary | ICD-10-CM | POA: Diagnosis not present

## 2014-06-16 DIAGNOSIS — L98499 Non-pressure chronic ulcer of skin of other sites with unspecified severity: Secondary | ICD-10-CM

## 2014-06-16 DIAGNOSIS — E0810 Diabetes mellitus due to underlying condition with ketoacidosis without coma: Secondary | ICD-10-CM

## 2014-06-16 DIAGNOSIS — F1410 Cocaine abuse, uncomplicated: Secondary | ICD-10-CM | POA: Diagnosis present

## 2014-06-16 DIAGNOSIS — R0602 Shortness of breath: Secondary | ICD-10-CM

## 2014-06-16 DIAGNOSIS — F1210 Cannabis abuse, uncomplicated: Secondary | ICD-10-CM

## 2014-06-16 DIAGNOSIS — J13 Pneumonia due to Streptococcus pneumoniae: Secondary | ICD-10-CM

## 2014-06-16 DIAGNOSIS — J4489 Other specified chronic obstructive pulmonary disease: Secondary | ICD-10-CM | POA: Diagnosis present

## 2014-06-16 DIAGNOSIS — Z9862 Peripheral vascular angioplasty status: Secondary | ICD-10-CM

## 2014-06-16 DIAGNOSIS — Z9119 Patient's noncompliance with other medical treatment and regimen: Secondary | ICD-10-CM

## 2014-06-16 DIAGNOSIS — E1140 Type 2 diabetes mellitus with diabetic neuropathy, unspecified: Secondary | ICD-10-CM

## 2014-06-16 LAB — URINALYSIS, ROUTINE W REFLEX MICROSCOPIC
Bilirubin Urine: NEGATIVE
Ketones, ur: NEGATIVE mg/dL
LEUKOCYTES UA: NEGATIVE
Nitrite: NEGATIVE
PROTEIN: 100 mg/dL — AB
Specific Gravity, Urine: 1.034 — ABNORMAL HIGH (ref 1.005–1.030)
Urobilinogen, UA: 0.2 mg/dL (ref 0.0–1.0)
pH: 5 (ref 5.0–8.0)

## 2014-06-16 LAB — URINE MICROSCOPIC-ADD ON

## 2014-06-16 LAB — BASIC METABOLIC PANEL
ANION GAP: 13 (ref 5–15)
ANION GAP: 13 (ref 5–15)
BUN: 21 mg/dL (ref 6–23)
BUN: 20 mg/dL (ref 6–23)
CALCIUM: 8.6 mg/dL (ref 8.4–10.5)
CHLORIDE: 102 meq/L (ref 96–112)
CHLORIDE: 103 meq/L (ref 96–112)
CO2: 22 mEq/L (ref 19–32)
CO2: 25 mEq/L (ref 19–32)
CREATININE: 0.81 mg/dL (ref 0.50–1.10)
CREATININE: 0.76 mg/dL (ref 0.50–1.10)
Calcium: 8.5 mg/dL (ref 8.4–10.5)
GFR calc Af Amer: >90 mL/min (ref >90)
GFR calc non Af Amer: 83 mL/min — ABNORMAL LOW (ref >90)
GFR calc non Af Amer: >90 mL/min (ref >90)
Glucose, Bld: 261 mg/dL — ABNORMAL HIGH (ref 70–99)
Glucose, Bld: 138 mg/dL — ABNORMAL HIGH (ref 70–99)
Potassium: 3.8 mEq/L (ref 3.7–5.3)
Potassium: 3.5 mEq/L — ABNORMAL LOW (ref 3.7–5.3)
Sodium: 137 mEq/L (ref 137–147)
Sodium: 141 mEq/L (ref 137–147)

## 2014-06-16 LAB — CBC
HCT: 38.8 % (ref 36.0–46.0)
HEMOGLOBIN: 12.5 g/dL (ref 12.0–15.0)
MCH: 28.8 pg (ref 26.0–34.0)
MCHC: 32.2 g/dL (ref 30.0–36.0)
MCV: 89.4 fL (ref 78.0–100.0)
PLATELETS: 342 10*3/uL (ref 150–400)
RBC: 4.34 MIL/uL (ref 3.87–5.11)
RDW: 13.7 % (ref 11.5–15.5)
WBC: 15.7 10*3/uL — ABNORMAL HIGH (ref 4.0–10.5)

## 2014-06-16 LAB — RAPID URINE DRUG SCREEN, HOSP PERFORMED
Amphetamines: NOT DETECTED
BARBITURATES: NOT DETECTED
BENZODIAZEPINES: NOT DETECTED
Cocaine: NOT DETECTED
Opiates: NOT DETECTED
Tetrahydrocannabinol: NOT DETECTED

## 2014-06-16 LAB — CBG MONITORING, ED
GLUCOSE-CAPILLARY: 290 mg/dL — AB (ref 70–99)
GLUCOSE-CAPILLARY: 241 mg/dL — AB (ref 70–99)
Glucose-Capillary: 448 mg/dL — ABNORMAL HIGH (ref 70–99)
Glucose-Capillary: 418 mg/dL — ABNORMAL HIGH (ref 70–99)
Glucose-Capillary: 318 mg/dL — ABNORMAL HIGH (ref 70–99)

## 2014-06-16 LAB — PRO B NATRIURETIC PEPTIDE: Pro B Natriuretic peptide (BNP): 2014 pg/mL — ABNORMAL HIGH (ref 0–125)

## 2014-06-16 LAB — COMPREHENSIVE METABOLIC PANEL
ALK PHOS: 143 U/L — AB (ref 39–117)
ALT: 24 U/L (ref 0–35)
AST: 22 U/L (ref 0–37)
Albumin: 2.9 g/dL — ABNORMAL LOW (ref 3.5–5.2)
Anion gap: 19 — ABNORMAL HIGH (ref 5–15)
BUN: 20 mg/dL (ref 6–23)
CO2: 20 mEq/L (ref 19–32)
Calcium: 8.8 mg/dL (ref 8.4–10.5)
Chloride: 94 mEq/L — ABNORMAL LOW (ref 96–112)
Creatinine, Ser: 0.95 mg/dL (ref 0.50–1.10)
GFR calc Af Amer: 79 mL/min — ABNORMAL LOW (ref >90)
GFR calc non Af Amer: 68 mL/min — ABNORMAL LOW (ref >90)
GLUCOSE: 616 mg/dL — AB (ref 70–99)
POTASSIUM: 3.8 meq/L (ref 3.7–5.3)
SODIUM: 133 meq/L — AB (ref 137–147)
Total Bilirubin: <0.2 mg/dL — ABNORMAL LOW (ref 0.3–1.2)
Total Protein: 6.9 g/dL (ref 6.0–8.3)

## 2014-06-16 LAB — GLUCOSE, CAPILLARY
Glucose-Capillary: 125 mg/dL — ABNORMAL HIGH (ref 70–99)
Glucose-Capillary: 140 mg/dL — ABNORMAL HIGH (ref 70–99)
Glucose-Capillary: 148 mg/dL — ABNORMAL HIGH (ref 70–99)
Glucose-Capillary: 159 mg/dL — ABNORMAL HIGH (ref 70–99)
Glucose-Capillary: 188 mg/dL — ABNORMAL HIGH (ref 70–99)
Glucose-Capillary: 94 mg/dL (ref 70–99)

## 2014-06-16 LAB — MRSA PCR SCREENING: MRSA BY PCR: NEGATIVE

## 2014-06-16 LAB — TROPONIN I: Troponin I: <0.3 ng/mL (ref <0.30)

## 2014-06-16 LAB — MAGNESIUM: MAGNESIUM: 2.1 mg/dL (ref 1.5–2.5)

## 2014-06-16 MED ORDER — ACETAMINOPHEN 650 MG RE SUPP: 650.0000 mg | Freq: Four times a day (QID) | RECTAL | Status: DC | PRN

## 2014-06-16 MED ORDER — CLOPIDOGREL BISULFATE 75 MG PO TABS
75.0000 mg | ORAL_TABLET | Freq: Every day | ORAL | Status: DC
  Administered 2014-06-17 – 2014-06-18 (×2): 75 mg via ORAL
  Filled 2014-06-16 (×3): qty 1

## 2014-06-16 MED ORDER — ACETAMINOPHEN 325 MG PO TABS: 650.0000 mg | ORAL_TABLET | Freq: Four times a day (QID) | ORAL | Status: DC | PRN

## 2014-06-16 MED ORDER — OXYCODONE HCL 5 MG PO TABS
5.0000 mg | ORAL_TABLET | Freq: Three times a day (TID) | ORAL | Status: DC | PRN
  Administered 2014-06-16: 21:00:00 via ORAL
  Administered 2014-06-17 – 2014-06-18 (×3): 5 mg via ORAL
  Filled 2014-06-16 (×4): qty 1

## 2014-06-16 MED ORDER — DILTIAZEM HCL 100 MG IV SOLR
5.0000 mg/h | Freq: Once | INTRAVENOUS | Status: AC
  Administered 2014-06-16: 15 mg/h via INTRAVENOUS

## 2014-06-16 MED ORDER — DILTIAZEM HCL 25 MG/5ML IV SOLN
20.0000 mg | Freq: Once | INTRAVENOUS | Status: AC
  Administered 2014-06-16: 20 mg via INTRAVENOUS
  Filled 2014-06-16: qty 5

## 2014-06-16 MED ORDER — DEXTROSE 50 % IV SOLN: 25.0000 mL | INTRAVENOUS | Status: DC | PRN

## 2014-06-16 MED ORDER — DEXTROSE 5 % IV SOLN
5.0000 mg/h | Freq: Once | INTRAVENOUS | Status: AC
  Administered 2014-06-16: 5 mg/h via INTRAVENOUS

## 2014-06-16 MED ORDER — ONDANSETRON HCL 4 MG/2ML IJ SOLN
4.0000 mg | Freq: Four times a day (QID) | INTRAMUSCULAR | Status: DC | PRN
  Administered 2014-06-16 – 2014-06-18 (×3): 4 mg via INTRAVENOUS
  Filled 2014-06-16 (×3): qty 2

## 2014-06-16 MED ORDER — ALBUTEROL SULFATE HFA 108 (90 BASE) MCG/ACT IN AERS: 2.0000 | INHALATION_SPRAY | RESPIRATORY_TRACT | Status: DC | PRN

## 2014-06-16 MED ORDER — SODIUM CHLORIDE 0.9 % IV SOLN
INTRAVENOUS | Status: DC
  Administered 2014-06-16: 10 mL/h via INTRAVENOUS
  Administered 2014-06-16: 11:00:00 via INTRAVENOUS

## 2014-06-16 MED ORDER — FUROSEMIDE 10 MG/ML IJ SOLN
40.0000 mg | Freq: Two times a day (BID) | INTRAMUSCULAR | Status: DC
  Administered 2014-06-17 – 2014-06-18 (×3): 40 mg via INTRAVENOUS
  Filled 2014-06-16 (×5): qty 4

## 2014-06-16 MED ORDER — CARVEDILOL 6.25 MG PO TABS
6.2500 mg | ORAL_TABLET | Freq: Two times a day (BID) | ORAL | Status: DC
  Administered 2014-06-17 – 2014-06-18 (×3): 6.25 mg via ORAL
  Filled 2014-06-16 (×4): qty 1
  Filled 2014-06-16: qty 2
  Filled 2014-06-16: qty 1

## 2014-06-16 MED ORDER — PENTOXIFYLLINE ER 400 MG PO TBCR
400.0000 mg | EXTENDED_RELEASE_TABLET | Freq: Two times a day (BID) | ORAL | Status: DC
  Administered 2014-06-16 – 2014-06-18 (×4): 400 mg via ORAL
  Filled 2014-06-16 (×5): qty 1

## 2014-06-16 MED ORDER — DILTIAZEM HCL ER COATED BEADS 120 MG PO CP24
120.0000 mg | ORAL_CAPSULE | Freq: Every day | ORAL | Status: DC
  Administered 2014-06-16 – 2014-06-18 (×3): 120 mg via ORAL
  Filled 2014-06-16 (×3): qty 1

## 2014-06-16 MED ORDER — INSULIN REGULAR BOLUS VIA INFUSION
0.0000 [IU] | Freq: Three times a day (TID) | INTRAVENOUS | Status: DC
  Filled 2014-06-16: qty 10

## 2014-06-16 MED ORDER — GABAPENTIN 300 MG PO CAPS
300.0000 mg | ORAL_CAPSULE | Freq: Two times a day (BID) | ORAL | Status: DC
  Administered 2014-06-16 – 2014-06-18 (×4): 300 mg via ORAL
  Filled 2014-06-16 (×6): qty 1

## 2014-06-16 MED ORDER — RIVAROXABAN 20 MG PO TABS
20.0000 mg | ORAL_TABLET | Freq: Every day | ORAL | Status: DC
  Administered 2014-06-16 – 2014-06-17 (×2): 20 mg via ORAL
  Filled 2014-06-16 (×3): qty 1

## 2014-06-16 MED ORDER — LISINOPRIL 10 MG PO TABS
10.0000 mg | ORAL_TABLET | Freq: Every day | ORAL | Status: DC
  Administered 2014-06-16 – 2014-06-18 (×3): 10 mg via ORAL
  Filled 2014-06-16 (×3): qty 1

## 2014-06-16 MED ORDER — DEXTROSE-NACL 5-0.45 % IV SOLN
INTRAVENOUS | Status: DC
  Administered 2014-06-16: 21:00:00 via INTRAVENOUS

## 2014-06-16 MED ORDER — FUROSEMIDE 10 MG/ML IJ SOLN
80.0000 mg | Freq: Once | INTRAMUSCULAR | Status: AC
Start: 2014-06-16 — End: 2014-06-16
  Administered 2014-06-16: 80 mg via INTRAVENOUS
  Filled 2014-06-16: qty 8

## 2014-06-16 MED ORDER — IPRATROPIUM-ALBUTEROL 0.5-2.5 (3) MG/3ML IN SOLN: 3.0000 mL | Freq: Four times a day (QID) | RESPIRATORY_TRACT | Status: DC | PRN

## 2014-06-16 MED ORDER — LEVOTHYROXINE SODIUM 50 MCG PO TABS
50.0000 ug | ORAL_TABLET | Freq: Every day | ORAL | Status: DC
  Administered 2014-06-17 – 2014-06-18 (×2): 50 ug via ORAL
  Filled 2014-06-16 (×3): qty 1

## 2014-06-16 MED ORDER — FUROSEMIDE 10 MG/ML IJ SOLN: 40.0000 mg | Freq: Two times a day (BID) | INTRAMUSCULAR | Status: DC

## 2014-06-16 MED ORDER — SODIUM CHLORIDE 0.9 % IV SOLN
INTRAVENOUS | Status: DC
  Administered 2014-06-16: 7.2 [IU]/h via INTRAVENOUS
  Administered 2014-06-16: 3.9 [IU]/h via INTRAVENOUS
  Administered 2014-06-16: 6.9 [IU]/h via INTRAVENOUS
  Administered 2014-06-16: 6.4 [IU]/h via INTRAVENOUS
  Filled 2014-06-16: qty 1

## 2014-06-16 MED ORDER — ALBUTEROL SULFATE (2.5 MG/3ML) 0.083% IN NEBU
2.5000 mg | INHALATION_SOLUTION | RESPIRATORY_TRACT | Status: DC | PRN
Start: 2014-06-16 — End: 2014-06-18

## 2014-06-16 MED ORDER — SODIUM CHLORIDE 0.9 % IV SOLN: INTRAVENOUS | Status: DC

## 2014-06-16 NOTE — ED Provider Notes (Signed)
CSN: [id number]     Arrival date & time 06/16/14  1019 History   First MD Initiated Contact with Patient 06/16/14 1030     Chief Complaint  Patient presents with  . Chest Pain  . Shortness of Breath   [name] is a 53 yo caucasian F w/PMH of COPD, CHF, hypothyroidism, AFIB, PAD, and DM2 who presents today w/SOB and chest discomfort. Central chest feels odd sensation as if heart is flip flopping around. SOB worse today, more than normal. Thought she was having a panic attack. Has never felt this way before. Discomfort not associated with diaphoresis, N/V. Endorses worse LE swelling since her medications have been out.   She denies any abd pain, diarrhea, constipation, dysuria, hematuria, N/V, fever, chills, recent travel or sick contacts.   (Consider location/radiation/quality/duration/timing/severity/associated sxs/prior Treatment) Patient is a 53 y.o. female presenting with shortness of breath.  Shortness of Breath Severity:  Moderate Onset quality:  Sudden Duration:  3 hours Timing:  Constant Progression:  Unchanged Chronicity:  New Relieved by:  Sitting up Associated symptoms: chest pain (chest pressure) and diaphoresis   Associated symptoms: no cough, no fever, no sore throat, no syncope, no vomiting and no wheezing   Risk factors: hx of PE/DVT and tobacco use     Past Medical History  Diagnosis Date  . COPD (chronic obstructive pulmonary disease)   . Alcohol abuse   . Narcotic abuse   . Cocaine abuse   . Marijuana abuse   . Poorly controlled diabetes mellitus   . COPD (chronic obstructive pulmonary disease)   . Tobacco abuse   . Heart failure   . Alcoholic cirrhosis   . Cardiomyopathy   . Peripheral arterial disease   . Obesity   . CHF (congestive heart failure)   . Hypothyroidism   . GERD (gastroesophageal reflux disease)   . Headache(784.0)     "@ least once or twice/wk" (09/10/2013)  . Arthritis     "hands, arms, shoulders, legs, back" (09/10/2013)  .  Atrial fibrillation   . Critical lower limb ischemia   . Peripheral arterial disease    Past Surgical History  Procedure Laterality Date  . Lower extremity angiogram Right 09/10/2013    turbo hawk directional arthrectomy of a diffusely diseased SFA/notes 09/10/2013   . Cardioversion  ~ 02/2013    "twice" (09/10/2013)   Family History  Problem Relation Age of Onset  . Hypertension Mother   . Diabetes Mother   . Cancer Mother     breast, ovarian, colon  . Hypertension Father    History  Substance Use Topics  . Smoking status: Current Every Day Smoker -- 1.50 packs/day for 38 years    Types: Cigarettes  . Smokeless tobacco: Never Used  . Alcohol Use: 0.6 oz/week    1 Cans of beer per week     Comment: 09/10/2013 "might drink 1 beer/wk; never had problem w/it"   OB History   Grav Para Term Preterm Abortions TAB SAB Ect Mult Living   1 1 1       1      Review of Systems  Unable to perform ROS Constitutional: Positive for diaphoresis. Negative for fever.  HENT: Negative for sore throat.   Respiratory: Positive for shortness of breath. Negative for cough and wheezing.   Cardiovascular: Positive for chest pain (chest pressure). Negative for syncope.  Gastrointestinal: Negative for vomiting.  All other systems reviewed and are negative.     Allergies  Review of  patient's allergies indicates no known allergies.  Home Medications   Prior to Admission medications   Medication Sig Start Date End Date Taking? Authorizing Provider  ACCU-CHEK SOFTCLIX LANCETS lancets 1 each by Other route See admin instructions. Check blood sugar twice daily.   Yes Historical Provider, MD  albuterol (PROVENTIL HFA;VENTOLIN HFA) 108 (90 BASE) MCG/ACT inhaler Inhale 2 puffs into the lungs every 4 (four) hours as needed for wheezing or shortness of breath.   Yes Historical Provider, MD  aspirin EC 81 MG tablet Take 1 tablet (81 mg total) by mouth daily. 07/31/13  Yes Renella Cunas, MD  carvedilol  (COREG) 6.25 MG tablet Take 6.25 mg by mouth 2 (two) times daily with a meal.   Yes Historical Provider, MD  clopidogrel (PLAVIX) 75 MG tablet Take 1 tablet (75 mg total) by mouth daily with breakfast. 09/11/13  Yes Tarri Fuller, PA-C  furosemide (LASIX) 40 MG tablet Take 40 mg by mouth daily.   Yes Historical Provider, MD  gabapentin (NEURONTIN) 300 MG capsule Take 300 mg by mouth 2 (two) times daily.   Yes Historical Provider, MD  glipiZIDE (GLUCOTROL XL) 10 MG 24 hr tablet Take 10 mg by mouth daily with breakfast.   Yes Historical Provider, MD  glucose blood (ACCU-CHEK AVIVA PLUS) test strip 1 each by Other route See admin instructions. Check blood sugar twice daily.   Yes Historical Provider, MD  insulin aspart (NOVOLOG) 100 UNIT/ML injection Inject 10 Units into the skin 3 (three) times daily with meals.   Yes Historical Provider, MD  insulin glargine (LANTUS) 100 UNIT/ML injection Inject 25 Units into the skin at bedtime.   Yes Historical Provider, MD  ipratropium-albuterol (DUONEB) 0.5-2.5 (3) MG/3ML SOLN Take 3 mLs by nebulization every 6 (six) hours as needed (for shortness of breath).   Yes Historical Provider, MD  levothyroxine (SYNTHROID, LEVOTHROID) 50 MCG tablet Take 1 tablet (50 mcg total) by mouth daily. 07/31/13  Yes Renella Cunas, MD  lisinopril (PRINIVIL,ZESTRIL) 10 MG tablet Take 10 mg by mouth daily.   Yes Historical Provider, MD  magnesium oxide (MAG-OX) 400 MG tablet Take 400 mg by mouth 2 (two) times daily.   Yes Historical Provider, MD  Multiple Vitamin (DAILY VITE PO) Take 1 tablet by mouth daily.   Yes Historical Provider, MD  oxyCODONE (OXY IR/ROXICODONE) 5 MG immediate release tablet Take 5 mg by mouth every 8 (eight) hours as needed for severe pain.   Yes Historical Provider, MD  pentoxifylline (TRENTAL) 400 MG CR tablet Take 400 mg by mouth 2 (two) times daily.   Yes Historical Provider, MD  Rivaroxaban (XARELTO) 20 MG TABS tablet Take 20 mg by mouth every morning.    Yes  Historical Provider, MD   BP 147/125  Pulse 188  Resp 17  Ht 5\' 4"  (1.626 m)  Wt 210 lb (95.255 kg)  BMI 36.03 kg/m2  SpO2 98%  LMP 11/27/2012 Physical Exam  Nursing note and vitals reviewed. Constitutional: She appears distressed.  HENT:  Head: Normocephalic and atraumatic.  Eyes: Pupils are equal, round, and reactive to light.  Cardiovascular: Exam reveals no gallop and no friction rub.   No murmur heard. AFib RVR  Pulmonary/Chest: No respiratory distress.  Basilar crackles  Abdominal: She exhibits no distension (suprapubic). There is tenderness.  Musculoskeletal: She exhibits edema (2+ LEs). She exhibits no tenderness.  Skin: She is not diaphoretic.    ED Course  Procedures (including critical care time) Labs Review Labs Reviewed  CBC - Abnormal; Notable for the following:    WBC 15.7 (*)    All other components within normal limits  COMPREHENSIVE METABOLIC PANEL - Abnormal; Notable for the following:    Sodium 133 (*)    Chloride 94 (*)    Glucose, Bld 616 (*)    Albumin 2.9 (*)    Alkaline Phosphatase 143 (*)    Total Bilirubin <0.2 (*)    GFR calc non Af Amer 68 (*)    GFR calc Af Amer 79 (*)    Anion gap 19 (*)    All other components within normal limits  PRO B NATRIURETIC PEPTIDE - Abnormal; Notable for the following:    Pro B Natriuretic peptide (BNP) 2014.0 (*)    All other components within normal limits  URINALYSIS, ROUTINE W REFLEX MICROSCOPIC - Abnormal; Notable for the following:    Specific Gravity, Urine 1.034 (*)    Glucose, UA >1000 (*)    Hgb urine dipstick TRACE (*)    Protein, ur 100 (*)    All other components within normal limits  URINE MICROSCOPIC-ADD ON - Abnormal; Notable for the following:    Squamous Epithelial / LPF FEW (*)    Bacteria, UA FEW (*)    All other components within normal limits  CBG MONITORING, ED - Abnormal; Notable for the following:    Glucose-Capillary 448 (*)    All other components within normal limits  CBG  MONITORING, ED - Abnormal; Notable for the following:    Glucose-Capillary 418 (*)    All other components within normal limits  CBG MONITORING, ED - Abnormal; Notable for the following:    Glucose-Capillary 318 (*)    All other components within normal limits  TROPONIN I    Imaging Review Dg Chest Port 1 View  06/16/2014   CLINICAL DATA:  Shortness breath.  Chest pain.  EXAM: PORTABLE CHEST - 1 VIEW  COMPARISON:  06/06/2014.  FINDINGS: Cardiomegaly.  Central pulmonary vascular prominence.  No gross pneumothorax or segmental consolidation.  Calcified aorta.  IMPRESSION: Cardiomegaly.  Central pulmonary vascular prominence.  Calcified aorta.   Electronically Signed   By: Chauncey Cruel M.D.   On: 06/16/2014 11:05     EKG Interpretation   Date/Time:  Monday June 16 2014 10:28:57 EDT Ventricular Rate:  180 PR Interval:    QRS Duration: 78 QT Interval:  240 QTC Calculation: 415 R Axis:   19 Text Interpretation:  Atrial fibrillation with rapid ventricular response  Non-specific ST-t changes Confirmed by Ashok Cordia  MD, Lennette Bihari ([id number]) on  06/16/2014 10:43:50 AM      MDM   53 yo caucasian F here w/Chest discomfort and SOB. EKG shows AFib RVR w/rates in the 200s.  Given dilt bolus x 2 and started on dilt drip. Suspect related to medication non-compliant. Has been out of meds for 2 weeks w/increasing volume overload (LE edema, SOB, etc). Pt has 2+ pitting edema bilaterally. Will obtain CBC, CMP, trop, BNP, and CXR.  Glc elevated >600. Anion gap of 19. Started on insulin drip. K+ wnl. BNP elevated >2000. Due to volume overload, will not continue fluid boluses after 1L. Will hold off on any lasix for now due to hyperglycemia.   Dilt drip increased to 15. HR in the 130s. Pt's discomfort has resolved, now resting comfortably. Troponin <0.3.   Admitted to hospitalist w/cardiology following. Please see their notes for further details regarding the remainder of her hospital course. Throughout her time  in the  ED, pt remained stable.     Final diagnoses:  Atrial fibrillation with RVR  Hypervolemia, unspecified hypervolemia type  SOB (shortness of breath)  Pedal edema    Pt was seen under the supervision of Dr. Ashok Cordia.    Sherian Maroon, MD 06/16/14 [place], MD 06/16/14 [phone]

## 2014-06-16 NOTE — ED Notes (Signed)
Cardiology aware of heart rate conversion from AF to NSR

## 2014-06-16 NOTE — ED Notes (Signed)
Pt here for cp and feels like she is hyperventilating, pt reports that she thinks it may be anxiety since she paid her rent.

## 2014-06-16 NOTE — ED Notes (Signed)
Report called to Bobby Rumpf, RN

## 2014-06-16 NOTE — Progress Notes (Signed)
[place],  Provided pt with a list of primary care resources and a [place] Card application to help patient establish primary care. Patient was sleeping when CL entered room, left information on bedside table.

## 2014-06-16 NOTE — ED Notes (Signed)
CBG = 318. Nurse informed.

## 2014-06-16 NOTE — ED Notes (Signed)
Cardizem bag empty, Continuing cardizem dose per [facility]

## 2014-06-16 NOTE — H&P (Signed)
[facility] History and Physical  [name] [id number] DOB: [birth year] DOA: 06/16/2014  Referring physician: EDP PCP: Angelica Chessman, MD   Chief Complaint: not feeling well, chest discomfort  HPI: [name] is a 53 y.o. female with a history of chronic diastolic CHF,  DM, Non-compliance, Tobacco abuse, alcohol abuse, COPD, hypothyroidism, paroxysmal AFIB on Xarelto, PAD with critical limb ischemia, h/o alcohol and h/o cocaine abuse presents to the ER with the above complaint. She ran out of her meds 2 weeks ago and was unable to refill then since her [place] Medicaid was cut off and she was unable to fill her medications. Today she noticed some chest discomfort with palpitations, weakness, sensation of heart racing and was brought to ER by her Niece. In ER, noted to be in Afib with RVR, CBG of >600 and DKA Cards consulted, she is started on Cardizem gtt, TRH consulted   Review of Systems: positives bolded Constitutional:  No weight loss, night sweats, Fevers, chills, fatigue.  HEENT:  No headaches, Difficulty swallowing,Tooth/dental problems,Sore throat,  No sneezing, itching, ear ache, nasal congestion, post nasal drip,  Cardio-vascular:  No chest pain, Orthopnea, PND, swelling in lower extremities, anasarca, dizziness, palpitations  GI:  No heartburn, indigestion, abdominal pain, nausea, vomiting, diarrhea, change in bowel habits, loss of appetite  Resp:  No shortness of breath with exertion or at rest. No excess mucus, no productive cough, No non-productive cough, No coughing up of blood.No change in color of mucus.No wheezing.No chest wall deformity  Skin:  no rash or lesions.  GU:  no dysuria, change in color of urine, no urgency or frequency. No flank pain.  Musculoskeletal:  No joint pain or swelling. No decreased range of motion. No back pain.  Psych:  No change in mood or affect. No depression or anxiety. No memory loss.   Past Medical History    Diagnosis Date  . COPD (chronic obstructive pulmonary disease)   . Alcohol abuse   . Narcotic abuse   . Cocaine abuse   . Marijuana abuse   . Poorly controlled diabetes mellitus   . COPD (chronic obstructive pulmonary disease)   . Tobacco abuse   . Heart failure   . Alcoholic cirrhosis   . Cardiomyopathy   . Peripheral arterial disease   . Obesity   . CHF (congestive heart failure)   . Hypothyroidism   . GERD (gastroesophageal reflux disease)   . Headache(784.0)     "@ least once or twice/wk" (09/10/2013)  . Arthritis     "hands, arms, shoulders, legs, back" (09/10/2013)  . Atrial fibrillation   . Critical lower limb ischemia   . Peripheral arterial disease    Past Surgical History  Procedure Laterality Date  . Lower extremity angiogram Right 09/10/2013    turbo hawk directional arthrectomy of a diffusely diseased SFA/notes 09/10/2013   . Cardioversion  ~ 02/2013    "twice" (09/10/2013)   Social History:  reports that she has been smoking Cigarettes.  She has a 57 pack-year smoking history. She has never used smokeless tobacco. She reports that she drinks about .6 ounces of alcohol per week. She reports that she uses illicit drugs ("Crack" cocaine and Marijuana).  No Known Allergies  Family History  Problem Relation Age of Onset  . Hypertension Mother   . Diabetes Mother   . Cancer Mother     breast, ovarian, colon  . Hypertension Father      Prior to Admission medications   Medication  Sig Start Date End Date Taking? Authorizing Provider  ACCU-CHEK SOFTCLIX LANCETS lancets 1 each by Other route See admin instructions. Check blood sugar twice daily.   Yes Historical Provider, MD  albuterol (PROVENTIL HFA;VENTOLIN HFA) 108 (90 BASE) MCG/ACT inhaler Inhale 2 puffs into the lungs every 4 (four) hours as needed for wheezing or shortness of breath.   Yes Historical Provider, MD  aspirin EC 81 MG tablet Take 1 tablet (81 mg total) by mouth daily. 07/31/13  Yes Renella Cunas,  MD  carvedilol (COREG) 6.25 MG tablet Take 6.25 mg by mouth 2 (two) times daily with a meal.   Yes Historical Provider, MD  clopidogrel (PLAVIX) 75 MG tablet Take 1 tablet (75 mg total) by mouth daily with breakfast. 09/11/13  Yes Tarri Fuller, PA-C  furosemide (LASIX) 40 MG tablet Take 40 mg by mouth daily.   Yes Historical Provider, MD  gabapentin (NEURONTIN) 300 MG capsule Take 300 mg by mouth 2 (two) times daily.   Yes Historical Provider, MD  glipiZIDE (GLUCOTROL XL) 10 MG 24 hr tablet Take 10 mg by mouth daily with breakfast.   Yes Historical Provider, MD  glucose blood (ACCU-CHEK AVIVA PLUS) test strip 1 each by Other route See admin instructions. Check blood sugar twice daily.   Yes Historical Provider, MD  insulin aspart (NOVOLOG) 100 UNIT/ML injection Inject 10 Units into the skin 3 (three) times daily with meals.   Yes Historical Provider, MD  insulin glargine (LANTUS) 100 UNIT/ML injection Inject 25 Units into the skin at bedtime.   Yes Historical Provider, MD  ipratropium-albuterol (DUONEB) 0.5-2.5 (3) MG/3ML SOLN Take 3 mLs by nebulization every 6 (six) hours as needed (for shortness of breath).   Yes Historical Provider, MD  levothyroxine (SYNTHROID, LEVOTHROID) 50 MCG tablet Take 1 tablet (50 mcg total) by mouth daily. 07/31/13  Yes Renella Cunas, MD  lisinopril (PRINIVIL,ZESTRIL) 10 MG tablet Take 10 mg by mouth daily.   Yes Historical Provider, MD  magnesium oxide (MAG-OX) 400 MG tablet Take 400 mg by mouth 2 (two) times daily.   Yes Historical Provider, MD  Multiple Vitamin (DAILY VITE PO) Take 1 tablet by mouth daily.   Yes Historical Provider, MD  oxyCODONE (OXY IR/ROXICODONE) 5 MG immediate release tablet Take 5 mg by mouth every 8 (eight) hours as needed for severe pain.   Yes Historical Provider, MD  pentoxifylline (TRENTAL) 400 MG CR tablet Take 400 mg by mouth 2 (two) times daily.   Yes Historical Provider, MD  Rivaroxaban (XARELTO) 20 MG TABS tablet Take 20 mg by mouth every  morning.    Yes Historical Provider, MD   Physical Exam: Filed Vitals:   06/16/14 1500 06/16/14 1530 06/16/14 1600 06/16/14 1630  BP: 120/54 136/63 101/57 108/82  Pulse: 79 56 58 135  Resp: 21 14 22 23   Height:      Weight:      SpO2: 99% 99% 98% 100%    Wt Readings from Last 3 Encounters:  06/16/14 95.255 kg (210 lb)  03/13/14 92.942 kg (204 lb 14.4 oz)  02/01/14 92.08 kg (203 lb)    General:  Appears calm and comfortable, no distress, AAOx3 Eyes: PERRL, normal lids, irises & conjunctiva ENT: grossly normal hearing, lips & tongue Neck: no LAD, masses or thyromegaly Cardiovascular: Irregular rate and rhythm, tachycardic, no m/r/g, trace edema. Respiratory: CTA bilaterally, no w/r/r. Normal respiratory effort. Abdomen: soft, Nt, ND, BS present Skin: no rash or induration seen on limited exam Musculoskeletal:  grossly normal tone BUE/BLE Psychiatric: grossly normal mood and affect, speech fluent and appropriate Neurologic: grossly non-focal.          Labs on Admission:  Basic Metabolic Panel:  Recent Labs Lab 06/16/14 1050  NA 133*  K 3.8  CL 94*  CO2 20  GLUCOSE 616*  BUN 20  CREATININE 0.95  CALCIUM 8.8   Liver Function Tests:  Recent Labs Lab 06/16/14 1050  AST 22  ALT 24  ALKPHOS 143*  BILITOT <0.2*  PROT 6.9  ALBUMIN 2.9*   No results found for this basename: LIPASE, AMYLASE,  in the last 168 hours No results found for this basename: AMMONIA,  in the last 168 hours CBC:  Recent Labs Lab 06/16/14 1050  WBC 15.7*  HGB 12.5  HCT 38.8  MCV 89.4  PLT 342   Cardiac Enzymes:  Recent Labs Lab 06/16/14 1050  TROPONINI <0.30    BNP (last 3 results)  Recent Labs  02/01/14 1307 06/06/14 1200 06/16/14 1050  PROBNP 1615.0* 980.5* 2014.0*   CBG:  Recent Labs Lab 06/16/14 1404 06/16/14 1521 06/16/14 1626 06/16/14 1723  GLUCAP 448* 418* 318* 290*    Radiological Exams on Admission: Dg Chest Port 1 View  06/16/2014   CLINICAL DATA:   Shortness breath.  Chest pain.  EXAM: PORTABLE CHEST - 1 VIEW  COMPARISON:  06/06/2014.  FINDINGS: Cardiomegaly.  Central pulmonary vascular prominence.  No gross pneumothorax or segmental consolidation.  Calcified aorta.  IMPRESSION: Cardiomegaly.  Central pulmonary vascular prominence.  Calcified aorta.   Electronically Signed   By: Chauncey Cruel M.D.   On: 06/16/2014 11:05    EKG: Independently reviewed.Afib with RVR  Assessment/Plan Principal Problem:   Atrial fibrillation with RVR -continue Cardizem gtt, Coreg BID -per Cards -Continue Xarelto -will cycle cardiac enzymes     DKA -admit to SDU -gentle IVF with Insulin gtt -resume lantus/SSI once gap corrected -case management consult to assist with meds     COPD -nebs PRN   PAD with limb ischemia -continue plavix    Chronic diastolic CHF -clinically does not look overtly fluid overloaded -last ECHo with preserved EF in 2014   Tobacco abuse -counseled  Code Status: full code DVT Prophylaxis: continue xarelto Family Communication: none at bedside Disposition Plan: admit to SDU  Time spent: 52min  Kenyata Napier [facility] Pager [phone]  **Disclaimer: This note may have been dictated with voice recognition software. Similar sounding words can inadvertently be transcribed and this note may contain transcription errors which may not have been corrected upon publication of note.**

## 2014-06-16 NOTE — ED Notes (Signed)
Patient has now converted to normal sinus rhythm at a rate of 90.

## 2014-06-16 NOTE — Consult Note (Addendum)
Patient ID: [name] MRN: [id number], DOB/AGE: 1961-05-13   Admit date: 06/16/2014   Primary Physician: Angelica Chessman, MD Primary Cardiologist: Dr. Johnsie Cancel ([facility])/ Dr. Gwenlyn Found ([place])  Pt. Profile:  [name] is a 53 y.o. female with a history of non-compliance, continued tobacco abuse, alcohol abuse, COPD, CHF, hypothyroidism, paroxysmal AFIB on Xarelto, PAD with critical limb ischemia, and DM2 who presents today w/SOB and chest discomfort and found to be in Afib with RVR; HRs in 200s.   She has a history of PAD with TurboHawk atherectomy of right SFA 09/10/13 with two-vessel runoff for critical limb ischemia in a properly healing ulcer on the right heel.her followup lower extremity arterial Doppler study performed 09/25/13 revealed a right ABI of 0.5 to with a patent right SFA which had been occluded at prior Doppler 08/13/13. She has also had afib in the past require DCCV in [place].   The patient was in her usual state of health until this morning when she was cooking bacon she had sudden onset of chest pain shortness of breath and fluttering in her chest. She felt lightheaded but did not pass out.  Her Medicaid was cut off and she stopped taking all of her medicines about 2 weeks ago. Since that time she's noticed increased weight gain, abdominal distention, lower extremity swelling, orthopnea to the point that she sleeps in a rocking chair but she denies shortness of breath or PND. She has not had chest pain outside of the episode this morning with palpitations. She felt as if she were having an anxiety attack and like she may pass out. She states that she has never had anything like this before.     Problem List  Past Medical History  Diagnosis Date  . COPD (chronic obstructive pulmonary disease)   . Alcohol abuse   . Narcotic abuse   . Cocaine abuse   . Marijuana abuse   . Poorly controlled diabetes mellitus   . COPD (chronic obstructive pulmonary disease)   .  Tobacco abuse   . Heart failure   . Alcoholic cirrhosis   . Cardiomyopathy   . Peripheral arterial disease   . Obesity   . CHF (congestive heart failure)   . Hypothyroidism   . GERD (gastroesophageal reflux disease)   . Headache(784.0)     "@ least once or twice/wk" (09/10/2013)  . Arthritis     "hands, arms, shoulders, legs, back" (09/10/2013)  . Atrial fibrillation   . Critical lower limb ischemia   . Peripheral arterial disease     Past Surgical History  Procedure Laterality Date  . Lower extremity angiogram Right 09/10/2013    turbo hawk directional arthrectomy of a diffusely diseased SFA/notes 09/10/2013   . Cardioversion  ~ 02/2013    "twice" (09/10/2013)     Allergies  No Known Allergies   Home Medications  Prior to Admission medications   Medication Sig Start Date End Date Taking? Authorizing Provider  ACCU-CHEK SOFTCLIX LANCETS lancets 1 each by Other route See admin instructions. Check blood sugar twice daily.   Yes Historical Provider, MD  albuterol (PROVENTIL HFA;VENTOLIN HFA) 108 (90 BASE) MCG/ACT inhaler Inhale 2 puffs into the lungs every 4 (four) hours as needed for wheezing or shortness of breath.   Yes Historical Provider, MD  aspirin EC 81 MG tablet Take 1 tablet (81 mg total) by mouth daily. 07/31/13  Yes Renella Cunas, MD  carvedilol (COREG) 6.25 MG tablet Take 6.25 mg by  mouth 2 (two) times daily with a meal.   Yes Historical Provider, MD  clopidogrel (PLAVIX) 75 MG tablet Take 1 tablet (75 mg total) by mouth daily with breakfast. 09/11/13  Yes Tarri Fuller, PA-C  furosemide (LASIX) 40 MG tablet Take 40 mg by mouth daily.   Yes Historical Provider, MD  gabapentin (NEURONTIN) 300 MG capsule Take 300 mg by mouth 2 (two) times daily.   Yes Historical Provider, MD  glipiZIDE (GLUCOTROL XL) 10 MG 24 hr tablet Take 10 mg by mouth daily with breakfast.   Yes Historical Provider, MD  glucose blood (ACCU-CHEK AVIVA PLUS) test strip 1 each by Other route See admin  instructions. Check blood sugar twice daily.   Yes Historical Provider, MD  insulin aspart (NOVOLOG) 100 UNIT/ML injection Inject 10 Units into the skin 3 (three) times daily with meals.   Yes Historical Provider, MD  insulin glargine (LANTUS) 100 UNIT/ML injection Inject 25 Units into the skin at bedtime.   Yes Historical Provider, MD  ipratropium-albuterol (DUONEB) 0.5-2.5 (3) MG/3ML SOLN Take 3 mLs by nebulization every 6 (six) hours as needed (for shortness of breath).   Yes Historical Provider, MD  levothyroxine (SYNTHROID, LEVOTHROID) 50 MCG tablet Take 1 tablet (50 mcg total) by mouth daily. 07/31/13  Yes Renella Cunas, MD  lisinopril (PRINIVIL,ZESTRIL) 10 MG tablet Take 10 mg by mouth daily.   Yes Historical Provider, MD  magnesium oxide (MAG-OX) 400 MG tablet Take 400 mg by mouth 2 (two) times daily.   Yes Historical Provider, MD  Multiple Vitamin (DAILY VITE PO) Take 1 tablet by mouth daily.   Yes Historical Provider, MD  oxyCODONE (OXY IR/ROXICODONE) 5 MG immediate release tablet Take 5 mg by mouth every 8 (eight) hours as needed for severe pain.   Yes Historical Provider, MD  pentoxifylline (TRENTAL) 400 MG CR tablet Take 400 mg by mouth 2 (two) times daily.   Yes Historical Provider, MD  Rivaroxaban (XARELTO) 20 MG TABS tablet Take 20 mg by mouth every morning.    Yes Historical Provider, MD    Family History  Family History  Problem Relation Age of Onset  . Hypertension Mother   . Diabetes Mother   . Cancer Mother     breast, ovarian, colon  . Hypertension Father    Family Status  Relation Status Death Age  . Mother Deceased   . Father Deceased      Social History  History   Social History  . Marital Status: Single    Spouse Name: N/A    Number of Children: N/A  . Years of Education: N/A   Occupational History  . disabled    Social History Main Topics  . Smoking status: Current Every Day Smoker -- 1.50 packs/day for 38 years    Types: Cigarettes  . Smokeless  tobacco: Never Used  . Alcohol Use: 0.6 oz/week    1 Cans of beer per week     Comment: 09/10/2013 "might drink 1 beer/wk; never had problem w/it"  . Drug Use: Yes    Special: "Crack" cocaine, Marijuana     Comment: 09/10/2013 "last marijuana ~ 09/08/2013; last crack was ~ 7 months ago"  . Sexual Activity: Not Currently   Other Topics Concern  . Not on file   Social History Narrative  . No narrative on file     Review of Systems All other systems reviewed and are otherwise negative except as noted above.  Physical Exam  Blood pressure 108/82,  pulse 135, resp. rate 23, height 5\' 4"  (1.626 m), weight 210 lb (95.255 kg), last menstrual period 11/27/2012, SpO2 100.00%.   Constitutional: She appears distressed.  HENT:  Head: Normocephalic and atraumatic.  Eyes: Pupils are equal, round, and reactive to light.  Cardiovascular: Exam reveals no gallop and no friction rub.  No murmur heard. AFib RVR  Pulmonary/Chest: No respiratory distress.  Basilar crackles  Abdominal: She exhibits no distension (suprapubic). There is tenderness.  Musculoskeletal: She exhibits edema (2+ LEs). She exhibits no tenderness.  Skin: She is not diaphoretic.   Labs   Recent Labs  06/28/2014 1050  TROPONINI <0.30   Lab Results  Component Value Date   WBC 15.7* 06/28/14   HGB 12.5 June 28, 2014   HCT 38.8 06/28/14   MCV 89.4 06-28-2014   PLT 342 06-28-14    Recent Labs Lab 28-Jun-2014 1050  NA 133*  K 3.8  CL 94*  CO2 20  BUN 20  CREATININE 0.95  CALCIUM 8.8  PROT 6.9  BILITOT <0.2*  ALKPHOS 143*  ALT 24  AST 22  GLUCOSE 616*   Lab Results  Component Value Date   CHOL 258* 07/26/2013   HDL 58 07/26/2013   LDLCALC 154* 07/26/2013   TRIG 232* 07/26/2013      Radiology/Studies    Dg Chest Port 1 View  2014/06/28   CLINICAL DATA:  Shortness breath.  Chest pain.  EXAM: PORTABLE CHEST - 1 VIEW  COMPARISON:  06/06/2014.  FINDINGS: Cardiomegaly.  Central pulmonary vascular prominence.  No gross  pneumothorax or segmental consolidation.  Calcified aorta.  IMPRESSION: Cardiomegaly.  Central pulmonary vascular prominence.  Calcified aorta.     2D ECHO: 09/04/2013: EF 60% LV EF: 60% Study Conclusions - Left ventricle: The cavity size was normal. Wall thickness was increased in a pattern of mild LVH. The estimated ejection fraction was 60%. Regional wall motion abnormalities cannot be excluded. - Left atrium: The atrium was mildly dilated. - Right ventricle: The cavity size was normal. Systolic function was normal.   ECG Afib with RVR HR 180. Non-specific ST changes.   ASSESSMENT AND PLAN  [name] is a 53 y.o. female with a history of non-compliance, continued tobacco abuse, alcohol abuse, COPD, CHF, hypothyroidism, paroxysmal AFIB on Xarelto, PAD s/p stenting, and DM2 who presents today w/SOB and chest discomfort and found to be in Afib with RVR.   Atrial fibrillation with RVR- Given dilt bolus x 2 and started on dilt drip with HRs 200s--> 140s. -- Non compliant will all medications. Will start heparin gtt. Continue diltiazem gtt. If her rates do not improve may need TEE/DCCV in the AM. Will make NPO after midnight. May be better to wait until more diuresed before attempting DCCV. -- Will check magnesium   Acute on chronic diastolic CHF- chest x-ray with pulmonary vascular congestion and BNP 2000. -- 2D ECHO: 09/04/2013: EF 60%, mild LVH, mild LA dilation. Wall motion abnormalities could not be excluded on this study.  -- Has been out of meds for 2 weeks w/increasing volume overload (LE edema, SOB, etc). Pt has 2+ pitting edema bilaterally. Has been out of meds for 2 weeks w/increasing volume overload (LE edema, SOB, etc). Pt has 2+ pitting edema bilaterally. -- Will start IV Lasix 80 x1. Tomorrow will start 40mg  IV Lasix BID. Creat normal  Chest pain- in the setting of A. fib with RVR with heart rates in the 200s. However she does have many risk factors for CAD including  hypertension, hyperlipidemia, diabetes, obesity, continued smoking and a family history of heart disease. --Troponin x1 negative, ECG with some nonspecific ST changes -- Will admit for observation -- Cycle Troponin and serial ECGs -- MPI 09/09/14: Low risk stress nuclear study. No reversible ischemia. LV systolic function is mildly depressed..  LV Ejection Fraction: 48%. LV Wall Motion: Mild global depression of LV systolic function without segmental wall motion abnormalities.  Hyperglycemia- will have medicine admit in the setting of BG >600. Anion gap of 19. Started on insulin drip. K+ wnl. Given fluids cautiously in the setting of CHF  PAD- History of TurboHawk atherectomy of right SFA 09/10/13 with two-vessel runoff for critical limb ischemia in a properly healing ulcer on the right heel.her followup lower extremity arterial Doppler study performed 09/25/13 revealed a right ABI of 0.5 to with a patent right SFA which had been occluded at prior Doppler 08/13/13.    Tyrell Antonio, PA-C 06/16/2014, 4:37 PM  Pager [phone]  The patient was seen, examined and discussed with [name], PA-C and I agree with the above.   53 year old female with known alcohol and tobacco abuse, known paroxysmal a-fib on anticoagulation with Xarelto, h/o PVD who stopped using her meds 2 weeks ago. She presented in a-fib with RVR and significant fluid overload.   She was started on Cardizem drip, Heparin drip and cardioverted to SR in the ER at 5:30 pm. We will switch to oral cardizem, continue iv Heparin, restart Xarelto. She will require significant diuresis, we will start with Lasix 80 iv x 1 and reassess in the am. She will also need a care management consult for assistance with medication cost.    She also has poorly controlled diabetes, we will have hospitalist address that.   Dorothy Spark 06/16/2014

## 2014-06-17 DIAGNOSIS — E1142 Type 2 diabetes mellitus with diabetic polyneuropathy: Secondary | ICD-10-CM

## 2014-06-17 DIAGNOSIS — E1149 Type 2 diabetes mellitus with other diabetic neurological complication: Secondary | ICD-10-CM

## 2014-06-17 DIAGNOSIS — I509 Heart failure, unspecified: Secondary | ICD-10-CM

## 2014-06-17 DIAGNOSIS — I5033 Acute on chronic diastolic (congestive) heart failure: Secondary | ICD-10-CM

## 2014-06-17 LAB — BASIC METABOLIC PANEL
ANION GAP: 12 (ref 5–15)
ANION GAP: 12 (ref 5–15)
ANION GAP: 12 (ref 5–15)
Anion gap: 13 (ref 5–15)
Anion gap: 14 (ref 5–15)
Anion gap: 12 (ref 5–15)
BUN: 21 mg/dL (ref 6–23)
BUN: 23 mg/dL (ref 6–23)
BUN: 22 mg/dL (ref 6–23)
BUN: 24 mg/dL — ABNORMAL HIGH (ref 6–23)
BUN: 24 mg/dL — ABNORMAL HIGH (ref 6–23)
BUN: 27 mg/dL — AB (ref 6–23)
CALCIUM: 8.5 mg/dL (ref 8.4–10.5)
CALCIUM: 8.4 mg/dL (ref 8.4–10.5)
CALCIUM: 8.9 mg/dL (ref 8.4–10.5)
CHLORIDE: 99 meq/L (ref 96–112)
CHLORIDE: 95 meq/L — AB (ref 96–112)
CO2: 25 mEq/L (ref 19–32)
CO2: 24 mEq/L (ref 19–32)
CO2: 28 mEq/L (ref 19–32)
CO2: 24 meq/L (ref 19–32)
CO2: 27 meq/L (ref 19–32)
CO2: 29 meq/L (ref 19–32)
CREATININE: 0.73 mg/dL (ref 0.50–1.10)
CREATININE: 0.78 mg/dL (ref 0.50–1.10)
CREATININE: 0.95 mg/dL (ref 0.50–1.10)
Calcium: 8.8 mg/dL (ref 8.4–10.5)
Calcium: 8.4 mg/dL (ref 8.4–10.5)
Calcium: 8.6 mg/dL (ref 8.4–10.5)
Chloride: 102 mEq/L (ref 96–112)
Chloride: 92 mEq/L — ABNORMAL LOW (ref 96–112)
Chloride: 92 mEq/L — ABNORMAL LOW (ref 96–112)
Chloride: 95 mEq/L — ABNORMAL LOW (ref 96–112)
Creatinine, Ser: 0.8 mg/dL (ref 0.50–1.10)
Creatinine, Ser: 0.97 mg/dL (ref 0.50–1.10)
Creatinine, Ser: 1.08 mg/dL (ref 0.50–1.10)
GFR calc Af Amer: >90 mL/min (ref >90)
GFR calc Af Amer: 79 mL/min — ABNORMAL LOW (ref >90)
GFR calc Af Amer: 77 mL/min — ABNORMAL LOW (ref >90)
GFR calc Af Amer: 68 mL/min — ABNORMAL LOW (ref >90)
GFR calc non Af Amer: >90 mL/min (ref >90)
GFR calc non Af Amer: >90 mL/min (ref >90)
GFR calc non Af Amer: 84 mL/min — ABNORMAL LOW (ref >90)
GFR calc non Af Amer: 68 mL/min — ABNORMAL LOW (ref >90)
GFR calc non Af Amer: 66 mL/min — ABNORMAL LOW (ref >90)
GFR calc non Af Amer: 58 mL/min — ABNORMAL LOW (ref >90)
GLUCOSE: 252 mg/dL — AB (ref 70–99)
GLUCOSE: 287 mg/dL — AB (ref 70–99)
Glucose, Bld: 148 mg/dL — ABNORMAL HIGH (ref 70–99)
Glucose, Bld: 250 mg/dL — ABNORMAL HIGH (ref 70–99)
Glucose, Bld: 259 mg/dL — ABNORMAL HIGH (ref 70–99)
Glucose, Bld: 428 mg/dL — ABNORMAL HIGH (ref 70–99)
Potassium: 4 mEq/L (ref 3.7–5.3)
Potassium: 4.3 mEq/L (ref 3.7–5.3)
Potassium: 4.2 mEq/L (ref 3.7–5.3)
Potassium: 4.4 mEq/L (ref 3.7–5.3)
Potassium: 4.2 mEq/L (ref 3.7–5.3)
Potassium: 4.8 mEq/L (ref 3.7–5.3)
Sodium: 139 mEq/L (ref 137–147)
Sodium: 135 mEq/L — ABNORMAL LOW (ref 137–147)
Sodium: 135 mEq/L — ABNORMAL LOW (ref 137–147)
Sodium: 129 mEq/L — ABNORMAL LOW (ref 137–147)
Sodium: 133 mEq/L — ABNORMAL LOW (ref 137–147)
Sodium: 136 mEq/L — ABNORMAL LOW (ref 137–147)

## 2014-06-17 LAB — CBC
HCT: 36.9 % (ref 36.0–46.0)
Hemoglobin: 11.7 g/dL — ABNORMAL LOW (ref 12.0–15.0)
MCH: 29 pg (ref 26.0–34.0)
MCHC: 31.7 g/dL (ref 30.0–36.0)
MCV: 91.3 fL (ref 78.0–100.0)
Platelets: 260 10*3/uL (ref 150–400)
RBC: 4.04 MIL/uL (ref 3.87–5.11)
RDW: 14.1 % (ref 11.5–15.5)
WBC: 13.3 10*3/uL — ABNORMAL HIGH (ref 4.0–10.5)

## 2014-06-17 LAB — GLUCOSE, CAPILLARY
GLUCOSE-CAPILLARY: 260 mg/dL — AB (ref 70–99)
Glucose-Capillary: 156 mg/dL — ABNORMAL HIGH (ref 70–99)
Glucose-Capillary: 260 mg/dL — ABNORMAL HIGH (ref 70–99)
Glucose-Capillary: 248 mg/dL — ABNORMAL HIGH (ref 70–99)
Glucose-Capillary: 305 mg/dL — ABNORMAL HIGH (ref 70–99)

## 2014-06-17 MED ORDER — INSULIN GLARGINE 100 UNIT/ML ~~LOC~~ SOLN
40.0000 [IU] | Freq: Two times a day (BID) | SUBCUTANEOUS | Status: DC
  Filled 2014-06-17: qty 0.4

## 2014-06-17 MED ORDER — INSULIN GLARGINE 100 UNIT/ML ~~LOC~~ SOLN
40.0000 [IU] | Freq: Two times a day (BID) | SUBCUTANEOUS | Status: DC
  Administered 2014-06-17 (×2): 40 [IU] via SUBCUTANEOUS
  Filled 2014-06-17 (×6): qty 0.4

## 2014-06-17 MED ORDER — NICOTINE 21 MG/24HR TD PT24
21.0000 mg | MEDICATED_PATCH | Freq: Every day | TRANSDERMAL | Status: DC
  Administered 2014-06-17 – 2014-06-18 (×2): 21 mg via TRANSDERMAL
  Filled 2014-06-17 (×2): qty 1

## 2014-06-17 MED ORDER — INSULIN GLARGINE 100 UNIT/ML ~~LOC~~ SOLN
20.0000 [IU] | Freq: Every day | SUBCUTANEOUS | Status: DC
  Administered 2014-06-17: 02:00:00 20 [IU] via SUBCUTANEOUS
  Filled 2014-06-17 (×2): qty 0.2

## 2014-06-17 MED ORDER — OFF THE BEAT BOOK
Freq: Once | Status: AC
  Administered 2014-06-17
  Filled 2014-06-17: qty 1

## 2014-06-17 MED ORDER — INSULIN ASPART 100 UNIT/ML ~~LOC~~ SOLN
0.0000 [IU] | Freq: Three times a day (TID) | SUBCUTANEOUS | Status: DC
  Administered 2014-06-17: 17:00:00 7 [IU] via SUBCUTANEOUS
  Administered 2014-06-17 (×2): 11 [IU] via SUBCUTANEOUS
  Administered 2014-06-18: 3 [IU] via SUBCUTANEOUS
  Administered 2014-06-18: 4 [IU] via SUBCUTANEOUS

## 2014-06-17 MED ORDER — INSULIN ASPART 100 UNIT/ML ~~LOC~~ SOLN
0.0000 [IU] | Freq: Every day | SUBCUTANEOUS | Status: DC
  Administered 2014-06-17: 4 [IU] via SUBCUTANEOUS

## 2014-06-17 MED ORDER — LIVING WELL WITH DIABETES BOOK
Freq: Once | Status: AC
  Administered 2014-06-17
  Filled 2014-06-17: qty 1

## 2014-06-17 NOTE — ED Provider Notes (Signed)
I saw and evaluated the patient, reviewed the resident's note and I agree with the findings and plan.   EKG Interpretation   Date/Time:  Monday June 16 2014 10:28:57 EDT Ventricular Rate:  180 PR Interval:    QRS Duration: 78 QT Interval:  240 QTC Calculation: 415 R Axis:   19 Text Interpretation:  Atrial fibrillation with rapid ventricular response  Non-specific ST-t changes Confirmed by Belky Mundo  MD, Lennette Bihari ([id number]) on  06/16/2014 10:43:50 AM      Pt with hx afib, c/o chest tightness and sob, palpitations.  Noted non compliance w all meds in past few weeks. Also w hx dm, non compliant, +polyuria.   Pt in rapid afib hr 180s.  cardizem iv bolus and gtt. Continuous pulse ox and monitor. o2 [place]. Ecg. Cxr. Labs.   cardizem iv gtt titrated as initially hr remains in 140 range post initiated bolus and gtt.  On additional rechecks pt feeling mildly improved, no chest pain.   Med service contacted for admission.  Results for orders placed during the hospital encounter of 06/16/14  MRSA PCR SCREENING      Result Value Ref Range   MRSA by PCR NEGATIVE  NEGATIVE  CBC      Result Value Ref Range   WBC 15.7 (*) 4.0 - 10.5 K/uL   RBC 4.34  3.87 - 5.11 MIL/uL   Hemoglobin 12.5  12.0 - 15.0 g/dL   HCT 38.8  36.0 - 46.0 %   MCV 89.4  78.0 - 100.0 fL   MCH 28.8  26.0 - 34.0 pg   MCHC 32.2  30.0 - 36.0 g/dL   RDW 13.7  11.5 - 15.5 %   Platelets 342  150 - 400 K/uL  COMPREHENSIVE METABOLIC PANEL      Result Value Ref Range   Sodium 133 (*) 137 - 147 mEq/L   Potassium 3.8  3.7 - 5.3 mEq/L   Chloride 94 (*) 96 - 112 mEq/L   CO2 20  19 - 32 mEq/L   Glucose, Bld 616 (*) 70 - 99 mg/dL   BUN 20  6 - 23 mg/dL   Creatinine, Ser 0.95  0.50 - 1.10 mg/dL   Calcium 8.8  8.4 - 10.5 mg/dL   Total Protein 6.9  6.0 - 8.3 g/dL   Albumin 2.9 (*) 3.5 - 5.2 g/dL   AST 22  0 - 37 U/L   ALT 24  0 - 35 U/L   Alkaline Phosphatase 143 (*) 39 - 117 U/L   Total Bilirubin <0.2 (*) 0.3 - 1.2 mg/dL   GFR calc  non Af Amer 68 (*) >90 mL/min   GFR calc Af Amer 79 (*) >90 mL/min   Anion gap 19 (*) 5 - 15  TROPONIN I      Result Value Ref Range   Troponin I <0.30  <0.30 ng/mL  PRO B NATRIURETIC PEPTIDE      Result Value Ref Range   Pro B Natriuretic peptide (BNP) 2014.0 (*) 0 - 125 pg/mL  URINALYSIS, ROUTINE W REFLEX MICROSCOPIC      Result Value Ref Range   Color, Urine YELLOW  YELLOW   APPearance CLEAR  CLEAR   Specific Gravity, Urine 1.034 (*) 1.005 - 1.030   pH 5.0  5.0 - 8.0   Glucose, UA >1000 (*) NEGATIVE mg/dL   Hgb urine dipstick TRACE (*) NEGATIVE   Bilirubin Urine NEGATIVE  NEGATIVE   Ketones, ur NEGATIVE  NEGATIVE mg/dL   Protein,  ur 100 (*) NEGATIVE mg/dL   Urobilinogen, UA 0.2  0.0 - 1.0 mg/dL   Nitrite NEGATIVE  NEGATIVE   Leukocytes, UA NEGATIVE  NEGATIVE  URINE MICROSCOPIC-ADD ON      Result Value Ref Range   Squamous Epithelial / LPF FEW (*) RARE   WBC, UA 7-10  <3 WBC/hpf   RBC / HPF 0-2  <3 RBC/hpf   Bacteria, UA FEW (*) RARE  MAGNESIUM      Result Value Ref Range   Magnesium 2.1  1.5 - 2.5 mg/dL  BASIC METABOLIC PANEL      Result Value Ref Range   Sodium 137  137 - 147 mEq/L   Potassium 3.8  3.7 - 5.3 mEq/L   Chloride 102  96 - 112 mEq/L   CO2 22  19 - 32 mEq/L   Glucose, Bld 261 (*) 70 - 99 mg/dL   BUN 21  6 - 23 mg/dL   Creatinine, Ser 0.81  0.50 - 1.10 mg/dL   Calcium 8.6  8.4 - 10.5 mg/dL   GFR calc non Af Amer 83 (*) >90 mL/min   GFR calc Af Amer >90  >90 mL/min   Anion gap 13  5 - 15  BASIC METABOLIC PANEL      Result Value Ref Range   Sodium 141  137 - 147 mEq/L   Potassium 3.5 (*) 3.7 - 5.3 mEq/L   Chloride 103  96 - 112 mEq/L   CO2 25  19 - 32 mEq/L   Glucose, Bld 138 (*) 70 - 99 mg/dL   BUN 20  6 - 23 mg/dL   Creatinine, Ser 0.76  0.50 - 1.10 mg/dL   Calcium 8.5  8.4 - 10.5 mg/dL   GFR calc non Af Amer >90  >90 mL/min   GFR calc Af Amer >90  >90 mL/min   Anion gap 13  5 - 15  BASIC METABOLIC PANEL      Result Value Ref Range   Sodium  139  137 - 147 mEq/L   Potassium 4.0  3.7 - 5.3 mEq/L   Chloride 102  96 - 112 mEq/L   CO2 25  19 - 32 mEq/L   Glucose, Bld 148 (*) 70 - 99 mg/dL   BUN 21  6 - 23 mg/dL   Creatinine, Ser 0.73  0.50 - 1.10 mg/dL   Calcium 8.5  8.4 - 10.5 mg/dL   GFR calc non Af Amer >90  >90 mL/min   GFR calc Af Amer >90  >90 mL/min   Anion gap 12  5 - 15  URINE RAPID DRUG SCREEN (HOSP PERFORMED)      Result Value Ref Range   Opiates NONE DETECTED  NONE DETECTED   Cocaine NONE DETECTED  NONE DETECTED   Benzodiazepines NONE DETECTED  NONE DETECTED   Amphetamines NONE DETECTED  NONE DETECTED   Tetrahydrocannabinol NONE DETECTED  NONE DETECTED   Barbiturates NONE DETECTED  NONE DETECTED  GLUCOSE, CAPILLARY      Result Value Ref Range   Glucose-Capillary 188 (*) 70 - 99 mg/dL  GLUCOSE, CAPILLARY      Result Value Ref Range   Glucose-Capillary 125 (*) 70 - 99 mg/dL   Comment 1 Notify RN     Comment 2 Documented in Chart    GLUCOSE, CAPILLARY      Result Value Ref Range   Glucose-Capillary 94  70 - 99 mg/dL   Comment 1 Notify RN  Comment 2 Documented in Chart    GLUCOSE, CAPILLARY      Result Value Ref Range   Glucose-Capillary 148 (*) 70 - 99 mg/dL  GLUCOSE, CAPILLARY      Result Value Ref Range   Glucose-Capillary 159 (*) 70 - 99 mg/dL  GLUCOSE, CAPILLARY      Result Value Ref Range   Glucose-Capillary 140 (*) 70 - 99 mg/dL  GLUCOSE, CAPILLARY      Result Value Ref Range   Glucose-Capillary 156 (*) 70 - 99 mg/dL  CBG MONITORING, ED      Result Value Ref Range   Glucose-Capillary 448 (*) 70 - 99 mg/dL  CBG MONITORING, ED      Result Value Ref Range   Glucose-Capillary 418 (*) 70 - 99 mg/dL  CBG MONITORING, ED      Result Value Ref Range   Glucose-Capillary 318 (*) 70 - 99 mg/dL  CBG MONITORING, ED      Result Value Ref Range   Glucose-Capillary 290 (*) 70 - 99 mg/dL  CBG MONITORING, ED      Result Value Ref Range   Glucose-Capillary 241 (*) 70 - 99 mg/dL   Dg Chest 2  View  06/06/2014   CLINICAL DATA:  Cough.  Shortness of breath.  EXAM: CHEST  2 VIEW  COMPARISON:  Chest x-ray 02/01/2014.  FINDINGS: Lung volumes are normal. No consolidative airspace disease. No pleural effusions. No pneumothorax. No pulmonary nodule or mass noted. Pulmonary vasculature and the cardiomediastinal silhouette are within normal limits.  IMPRESSION: No radiographic evidence of acute cardiopulmonary disease.   Electronically Signed   By: Vinnie Langton M.D.   On: 06/06/2014 12:43   Dg Chest Port 1 View  06/16/2014   CLINICAL DATA:  Shortness breath.  Chest pain.  EXAM: PORTABLE CHEST - 1 VIEW  COMPARISON:  06/06/2014.  FINDINGS: Cardiomegaly.  Central pulmonary vascular prominence.  No gross pneumothorax or segmental consolidation.  Calcified aorta.  IMPRESSION: Cardiomegaly.  Central pulmonary vascular prominence.  Calcified aorta.   Electronically Signed   By: Chauncey Cruel M.D.   On: 06/16/2014 11:05    CRITICAL CARE  RE severe tachycardia, atrial fibrillation w rapid ventricular response, chest pain, dyspnea, severe hyperglycemia. Performed by: Mirna Mires Total critical care time: 35 Critical care time was exclusive of separately billable procedures and treating other patients. Critical care was necessary to treat or prevent imminent or life-threatening deterioration. Critical care was time spent personally by me on the following activities: development of treatment plan with patient and/or surrogate as well as nursing, discussions with consultants, evaluation of patient's response to treatment, examination of patient, obtaining history from patient or surrogate, ordering and performing treatments and interventions, ordering and review of laboratory studies, ordering and review of radiographic studies, pulse oximetry and re-evaluation of patient's condition.      Mirna Mires, MD 06/17/14 [phone]

## 2014-06-17 NOTE — Care Management Note (Addendum)
    Page 1 of 2   06/18/2014     2:54:53 PM CARE MANAGEMENT NOTE 06/18/2014  Patient:  [name], [name]   Account Number:  [id number]  Date Initiated:  06/17/2014  Documentation initiated by:  HUTCHINSON,CRYSTAL  Subjective/Objective Assessment:   admitted with DKA     Action/Plan:   CM to follow for disposition needs   Anticipated DC Date:  06/18/2014   Anticipated DC Plan:  [place]  CM consult  [facility] / P4[facility] (established/new)  [facility]  Medication Assistance      Choice offered to / List presented to:             Status of service:  Completed, signed off Medicare Important Message given?   (If response is "NO", the following Medicare IM given date fields will be blank) Date Medicare IM given:   Medicare IM given by:   Date Additional Medicare IM given:   Additional Medicare IM given by:    Discharge Disposition:  HOME/SELF CARE  Per UR Regulation:  Reviewed for med. necessity/level of care/duration of stay  If discussed at [place] of Stay Meetings, dates discussed:    Comments:  06/18/14 [place], BSN [phone] patient is for dc today, she has already used the 30 day free trial for xarelto so can not use it again til next year.  NCM called [place] to see if she could give patient some samples , she stated yes.  NCM informed patient to go to Cards office to pick up sample and NCM gave patient the pateint ast form for xarelto to fill out to get some asistance.  Paitent also has a f/u apt with [facility]. NCM faxed patient ast form over for patient to get started, informed patient she will need to send in her tax information also.  Crystal Hutchinson RN, BSN, MSHL, CCM  Nurse - Case Manager, (Unit [phone]  06/17/2014 4:31pm Unit updates CM that patient will not d/c today and will be transferred to other unit. NOTE:  MATCH dates may need to be updated at point of discharge.    Crystal Hutchinson RN, BSN, MSHL, CCM   Nurse - Case Manager, (Unit [phone]  06/17/2014 Selfpay Socail:  From home alone with supportive sister near by. Hx/o substance abuse Medication assistance Card:  Xarelto provided to patient with instructions on completion [place] [facility] appt 06/20/2014 [facility] Program provided for medication assistance until [employer] activated. effective date 06/17/2014 - 06/24/2014 $4.00 Med List provided to patient as resource Dispostion Plan:  Home / [place].

## 2014-06-17 NOTE — Progress Notes (Signed)
UR completed Eliu Batch K. Dymphna Wadley, RN, BSN, MSHL, CCM  06/17/2014 1:19 PM

## 2014-06-17 NOTE — Progress Notes (Signed)
[facility] PROGRESS NOTE  Assessment/Plan:  DKA (diabetic ketoacidoses)/ DM (diabetes mellitus), type 2 with peripheral vascular complications: - Started on IV insulin then transition to long acting and short acting insulin. Cont to titrate long actin insulin. - Due to noncompliance. - CBG's ACHS. Transfer to regular floor.  Atrial fibrillation with RVR - Started on diltiazem drip now converted to SR. - Cont coreg. - will need to be test for OSA.  Acute on chronic diastolic CHF (congestive heart failure), NYHA class 3/Essential HTN: - Estimated dry weight ~ 69 kg. - Cont IV lasix, restrict fluid, daily weights, apply ted hose. - increase ACE-I, Bp high. +JVD and lower ext edema.   Code Status: full code  DVT Prophylaxis: continue xarelto  Family Communication: none at bedside  Disposition Plan: admit to SDU    Consultants:  Cardiology  Procedures:  none  Antibiotics:  none   HPI/Subjective: No complains  Objective: Filed Vitals:   06/17/14 0300 06/17/14 0400 06/17/14 0500 06/17/14 0752  BP: 102/50 147/58 149/75 162/76  Pulse:   91 88  Temp:   98.4 F (36.9 C) 99 F (37.2 C)  TempSrc:   Oral Oral  Resp: 20 20 14 16   Height:      Weight:      SpO2:   93% 92%    Intake/Output Summary (Last 24 hours) at 06/17/14 1023 Last data filed at 06/17/14 0600  Gross per 24 hour  Intake    885 ml  Output   1900 ml  Net  -1015 ml   Filed Weights   06/16/14 1028 06/17/14 0000  Weight: 95.255 kg (210 lb) 97.5 kg (214 lb 15.2 oz)    Exam:  General: Alert, awake, oriented x3, in no acute distress.  HEENT: No bruits, no goiter. +JVD Heart: Regular rate and rhythm.3+ edema. Lungs: Good air movement, clear Abdomen: Soft, nontender, nondistended, positive bowel sounds.   Data Reviewed: Basic Metabolic Panel:  Recent Labs Lab 06/16/14 1050 06/16/14 1734 06/16/14 1757 06/16/14 2209 06/17/14 0115 06/17/14 0810  NA 133*  --  137 141 139 135*  K  3.8  --  3.8 3.5* 4.0 4.3  CL 94*  --  102 103 102 99  CO2 20  --  22 25 25 24   GLUCOSE 616*  --  261* 138* 148* 250*  BUN 20  --  21 20 21 23   CREATININE 0.95  --  0.81 0.76 0.73 0.78  CALCIUM 8.8  --  8.6 8.5 8.5 8.4  MG  --  2.1  --   --   --   --    Liver Function Tests:  Recent Labs Lab 06/16/14 1050  AST 22  ALT 24  ALKPHOS 143*  BILITOT <0.2*  PROT 6.9  ALBUMIN 2.9*   No results found for this basename: LIPASE, AMYLASE,  in the last 168 hours No results found for this basename: AMMONIA,  in the last 168 hours CBC:  Recent Labs Lab 06/16/14 1050 06/17/14 0810  WBC 15.7* 13.3*  HGB 12.5 11.7*  HCT 38.8 36.9  MCV 89.4 91.3  PLT 342 260   Cardiac Enzymes:  Recent Labs Lab 06/16/14 1050  TROPONINI <0.30   BNP (last 3 results)  Recent Labs  02/01/14 1307 06/06/14 1200 06/16/14 1050  PROBNP 1615.0* 980.5* 2014.0*   CBG:  Recent Labs Lab 06/16/14 2153 06/16/14 2258 06/16/14 2354 06/17/14 0106 06/17/14 0820  GLUCAP 148* 159* 140* 156* 260*    Recent Results (  from the past 240 hour(s))  MRSA PCR SCREENING     Status: None   Collection Time    06/16/14  7:19 PM      Result Value Ref Range Status   MRSA by PCR NEGATIVE  NEGATIVE Final   Comment:            The GeneXpert MRSA Assay (FDA     approved for NASAL specimens     only), is one component of a     comprehensive MRSA colonization     surveillance program. It is not     intended to diagnose MRSA     infection nor to guide or     monitor treatment for     MRSA infections.     Studies: Dg Chest Port 1 View  06/16/2014   CLINICAL DATA:  Shortness breath.  Chest pain.  EXAM: PORTABLE CHEST - 1 VIEW  COMPARISON:  06/06/2014.  FINDINGS: Cardiomegaly.  Central pulmonary vascular prominence.  No gross pneumothorax or segmental consolidation.  Calcified aorta.  IMPRESSION: Cardiomegaly.  Central pulmonary vascular prominence.  Calcified aorta.   Electronically Signed   By: Chauncey Cruel M.D.    On: 06/16/2014 11:05    Scheduled Meds: . carvedilol  6.25 mg Oral BID WC  . clopidogrel  75 mg Oral Q breakfast  . diltiazem  120 mg Oral Daily  . furosemide  40 mg Intravenous BID  . gabapentin  300 mg Oral BID  . insulin aspart  0-20 Units Subcutaneous TID WC  . insulin aspart  0-5 Units Subcutaneous QHS  . insulin glargine  20 Units Subcutaneous QHS  . levothyroxine  50 mcg Oral QAC breakfast  . lisinopril  10 mg Oral Daily  . pentoxifylline  400 mg Oral BID  . rivaroxaban  20 mg Oral Q supper   Continuous Infusions: . sodium chloride Stopped (06/16/14 1900)  . dextrose 5 % and 0.45% NaCl 75 mL/hr at 06/17/14 0600     FELIZ Marguarite Arbour  [facility] Pager [phone]. If 8PM-8AM, please contact night-coverage at [email or web address], password [facility] 06/17/2014, 10:23 AM  LOS: 1 day      **Disclaimer: This note may have been dictated with voice recognition software. Similar sounding words can inadvertently be transcribed and this note may contain transcription errors which may not have been corrected upon publication of note.**

## 2014-06-17 NOTE — Progress Notes (Signed)
Patient Name: [name] Date of Encounter: 06/17/2014     Principal Problem:   Atrial fibrillation with RVR Active Problems:   COPD (chronic obstructive pulmonary disease)   Tobacco abuse   DM (diabetes mellitus), type 2 with peripheral vascular complications   History of cocaine abuse   DKA (diabetic ketoacidoses)   Acute on chronic diastolic CHF (congestive heart failure), NYHA class 3    SUBJECTIVE  Feeling mcuh better. No CP or SOB. Slept in recliner last night but able to lie more flat than previously.   CURRENT MEDS . carvedilol  6.25 mg Oral BID WC  . clopidogrel  75 mg Oral Q breakfast  . diltiazem  120 mg Oral Daily  . furosemide  40 mg Intravenous BID  . gabapentin  300 mg Oral BID  . insulin aspart  0-20 Units Subcutaneous TID WC  . insulin aspart  0-5 Units Subcutaneous QHS  . insulin glargine  20 Units Subcutaneous QHS  . levothyroxine  50 mcg Oral QAC breakfast  . lisinopril  10 mg Oral Daily  . pentoxifylline  400 mg Oral BID  . rivaroxaban  20 mg Oral Q supper    OBJECTIVE  Filed Vitals:   06/17/14 0300 06/17/14 0400 06/17/14 0500 06/17/14 0752  BP: 102/50 147/58 149/75 162/76  Pulse:   91 88  Temp:   98.4 F (36.9 C) 99 F (37.2 C)  TempSrc:   Oral Oral  Resp: 20 20 14 16   Height:      Weight:      SpO2:   93% 92%    Intake/Output Summary (Last 24 hours) at 06/17/14 1015 Last data filed at 06/17/14 0600  Gross per 24 hour  Intake    885 ml  Output   1900 ml  Net  -1015 ml   Filed Weights   06/16/14 1028 06/17/14 0000  Weight: 210 lb (95.255 kg) 214 lb 15.2 oz (97.5 kg)    PHYSICAL EXAM  General: Pleasant, NAD. Neuro: Alert and oriented X 3. Moves all extremities spontaneously. Psych: Normal affect. HEENT:  Normal  Neck: Supple without bruits or JVD. Lungs:  Resp regular and unlabored, inspiratory rhonchi Heart: RRR no s3, s4, or murmurs. Abdomen: Soft, non-tender, non-distended, BS + x 4.  Extremities: No clubbing,  cyanosis. trace edema. DP/PT/Radials 2+ and equal bilaterally.  Accessory Clinical Findings  CBC  Recent Labs  06/16/14 1050 06/17/14 0810  WBC 15.7* 13.3*  HGB 12.5 11.7*  HCT 38.8 36.9  MCV 89.4 91.3  PLT 342 123456   Basic Metabolic Panel  Recent Labs  06/16/14 1734  06/17/14 0115 06/17/14 0810  NA  --   < > 139 135*  K  --   < > 4.0 4.3  CL  --   < > 102 99  CO2  --   < > 25 24  GLUCOSE  --   < > 148* 250*  BUN  --   < > 21 23  CREATININE  --   < > 0.73 0.78  CALCIUM  --   < > 8.5 8.4  MG 2.1  --   --   --   < > = values in this interval not displayed. Liver Function Tests  Recent Labs  06/16/14 1050  AST 22  ALT 24  ALKPHOS 143*  BILITOT <0.2*  PROT 6.9  ALBUMIN 2.9*    Cardiac Enzymes  Recent Labs  06/16/14 1050  TROPONINI <0.30    TELE  NSR with one run of atrial fibrillation with RVR over night.   Radiology/Studies  Dg Chest 2 View  06/06/2014   CLINICAL DATA:  Cough.  Shortness of breath.  EXAM: CHEST  2 VIEW  COMPARISON:  Chest x-ray 02/01/2014.  FINDINGS: Lung volumes are normal. No consolidative airspace disease. No pleural effusions. No pneumothorax. No pulmonary nodule or mass noted. Pulmonary vasculature and the cardiomediastinal silhouette are within normal limits.  IMPRESSION: No radiographic evidence of acute cardiopulmonary disease.   Electronically Signed   By: Vinnie Langton M.D.   On: 06/06/2014 12:43   Dg Chest Port 1 View  06/16/2014   CLINICAL DATA:  Shortness breath.  Chest pain.  EXAM: PORTABLE CHEST - 1 VIEW  COMPARISON:  06/06/2014.  FINDINGS: Cardiomegaly.  Central pulmonary vascular prominence.  No gross pneumothorax or segmental consolidation.  Calcified aorta.  IMPRESSION: Cardiomegaly.  Central pulmonary vascular prominence.  Calcified aorta.   Electronically Signed   By: Chauncey Cruel M.D.   On: 06/16/2014 11:05    ASSESSMENT AND PLAN [name] is a 53 y.o. female with a history of non-compliance, continued  tobacco abuse, alcohol abuse, COPD, CHF, hypothyroidism, paroxysmal AFIB on Xarelto, PAD with critical limb ischemia, and DM2 who presents today w/SOB and chest discomfort and found to be in Afib with RVR; HRs in 200s and DKA.  Atrial fibrillation with RVR- Placed on dilt- spontaneously converted into NSR. -- Non compliant will all medications.  -- On heparin gtt. Will need to be converted to oral anticoagulation. Was previously on Xarelto. Care management to help with medication costs. -- Normal magnesium   Acute on chronic diastolic CHF- chest x-ray with pulmonary vascular congestion and BNP 2000.  -- 2D ECHO: 09/04/2013: EF 60%, mild LVH, mild LA dilation. Wall motion abnormalities could not be excluded on this study.  -- Has been out of meds for 2 weeks w/increasing volume overload (LE edema, SOB, etc).  -- On IV Lasix 40mg  BID. Given 80mg  IV in ED. Net neg 1L. Feeling better   Chest pain- in the setting of A. fib with RVR with heart rates in the 200s.  --Troponin x1 negative, ECG with some nonspecific ST changes  -- MPI 09/09/14: Low risk stress nuclear study. No reversible ischemia. LV systolic function is mildly depressed..  LV Ejection Fraction: 48%. LV Wall Motion: Mild global depression of LV systolic function without segmental wall motion abnormalities.   DKA- resolving. Per IM  PAD- History of [place]bo[place] atherectomy of right SFA 09/10/13 with two-vessel runoff for critical limb ischemia in a properly healing ulcer on the right heel.her followup lower extremity arterial Doppler study performed 09/25/13 revealed a right ABI of 0.5 to with a patent right SFA which had been occluded at prior Doppler 08/13/13.  -- Continue Plavix  Tobacco abuse  -- Counseled  HTN- will restart home ACE  Tyrell Antonio PA-C  Pager [phone]  The patient is not having any chest discomfort.  She is resting comfortably in a recliner without dyspnea at rest.  Rhythm remains normal sinus rhythm  with no further runs of atrial fibrillation.  On exam lungs are clear.  The heart reveals no gallop. Agree with assessment and plan as noted above.

## 2014-06-17 NOTE — Progress Notes (Signed)
Inpatient Diabetes Program Recommendations  AACE/ADA: New Consensus Statement on Inpatient Glycemic Control (2013)  Target Ranges:  Prepandial:   less than 140 mg/dL      Peak postprandial:   less than 180 mg/dL (1-2 hours)      Critically ill patients:  140 - 180 mg/dL   Diabetes Coordinator met with patient to discuss diabetes management at home.  Pt reports that she has been taking her insulin as prescribed (she did not run out of insulin) but her CBGs are still high.  Pt reports that she has difficulty eating healthy because she lives on a limited income and food stamps.  [facility] and attended all 3 diabetes management classes. Although patient has been able to get her insulin, she states that it is still too expensive and a less expensive option would be better.  Recommend switching pt to 70/30 so that at discharge she can purchase it from [employer] (ReliOn Novolin brand) for approximately $25 a vial. Thank you  Raoul Pitch BSN, RN,CDE Inpatient Diabetes Coordinator [phone] (team pager)

## 2014-06-18 DIAGNOSIS — J42 Unspecified chronic bronchitis: Secondary | ICD-10-CM

## 2014-06-18 LAB — BASIC METABOLIC PANEL
ANION GAP: 10 (ref 5–15)
ANION GAP: 11 (ref 5–15)
ANION GAP: 12 (ref 5–15)
ANION GAP: 14 (ref 5–15)
BUN: 28 mg/dL — ABNORMAL HIGH (ref 6–23)
BUN: 24 mg/dL — ABNORMAL HIGH (ref 6–23)
BUN: 23 mg/dL (ref 6–23)
BUN: 26 mg/dL — ABNORMAL HIGH (ref 6–23)
CALCIUM: 8.6 mg/dL (ref 8.4–10.5)
CO2: 29 meq/L (ref 19–32)
CO2: 27 mEq/L (ref 19–32)
CO2: 27 mEq/L (ref 19–32)
CO2: 25 mEq/L (ref 19–32)
CREATININE: 0.83 mg/dL (ref 0.50–1.10)
CREATININE: 0.82 mg/dL (ref 0.50–1.10)
Calcium: 9 mg/dL (ref 8.4–10.5)
Calcium: 8.7 mg/dL (ref 8.4–10.5)
Calcium: 9.2 mg/dL (ref 8.4–10.5)
Chloride: 96 mEq/L (ref 96–112)
Chloride: 98 mEq/L (ref 96–112)
Chloride: 95 mEq/L — ABNORMAL LOW (ref 96–112)
Chloride: 93 mEq/L — ABNORMAL LOW (ref 96–112)
Creatinine, Ser: 1.02 mg/dL (ref 0.50–1.10)
Creatinine, Ser: 0.77 mg/dL (ref 0.50–1.10)
GFR calc Af Amer: 73 mL/min — ABNORMAL LOW (ref >90)
GFR calc Af Amer: >90 mL/min (ref >90)
GFR calc non Af Amer: 63 mL/min — ABNORMAL LOW (ref >90)
GFR calc non Af Amer: 80 mL/min — ABNORMAL LOW (ref >90)
GFR, EST NON AFRICAN AMERICAN: 81 mL/min — AB (ref >90)
Glucose, Bld: 161 mg/dL — ABNORMAL HIGH (ref 70–99)
Glucose, Bld: 117 mg/dL — ABNORMAL HIGH (ref 70–99)
Glucose, Bld: 178 mg/dL — ABNORMAL HIGH (ref 70–99)
Glucose, Bld: 76 mg/dL (ref 70–99)
POTASSIUM: 3.9 meq/L (ref 3.7–5.3)
Potassium: 4.5 mEq/L (ref 3.7–5.3)
Potassium: 4.1 mEq/L (ref 3.7–5.3)
Potassium: 4.6 mEq/L (ref 3.7–5.3)
SODIUM: 135 meq/L — AB (ref 137–147)
Sodium: 136 mEq/L — ABNORMAL LOW (ref 137–147)
Sodium: 134 mEq/L — ABNORMAL LOW (ref 137–147)
Sodium: 132 mEq/L — ABNORMAL LOW (ref 137–147)

## 2014-06-18 LAB — GLUCOSE, CAPILLARY
GLUCOSE-CAPILLARY: 158 mg/dL — AB (ref 70–99)
Glucose-Capillary: 140 mg/dL — ABNORMAL HIGH (ref 70–99)

## 2014-06-18 MED ORDER — CLOPIDOGREL BISULFATE 75 MG PO TABS: 75.0000 mg | ORAL_TABLET | Freq: Every day | ORAL | Status: DC

## 2014-06-18 MED ORDER — LISINOPRIL 10 MG PO TABS: 10.0000 mg | ORAL_TABLET | Freq: Every day | ORAL | Status: DC

## 2014-06-18 MED ORDER — INSULIN ASPART PROT & ASPART (70-30 MIX) 100 UNIT/ML ~~LOC~~ SUSP: 35.0000 [IU] | Freq: Two times a day (BID) | SUBCUTANEOUS | Status: DC

## 2014-06-18 MED ORDER — INSULIN NPH ISOPHANE & REGULAR (70-30) 100 UNIT/ML ~~LOC~~ SUSP: 35.0000 [IU] | Freq: Two times a day (BID) | SUBCUTANEOUS | Status: DC

## 2014-06-18 MED ORDER — GLIPIZIDE ER 10 MG PO TB24
10.0000 mg | ORAL_TABLET | Freq: Every day | ORAL | Status: DC
  Administered 2014-06-18: 10 mg via ORAL
  Filled 2014-06-18 (×2): qty 1

## 2014-06-18 MED ORDER — PENTOXIFYLLINE ER 400 MG PO TBCR: 400.0000 mg | EXTENDED_RELEASE_TABLET | Freq: Two times a day (BID) | ORAL | Status: DC

## 2014-06-18 MED ORDER — INSULIN ASPART PROT & ASPART (70-30 MIX) 100 UNIT/ML ~~LOC~~ SUSP
40.0000 [IU] | Freq: Two times a day (BID) | SUBCUTANEOUS | Status: DC
  Administered 2014-06-18: 40 [IU] via SUBCUTANEOUS
  Filled 2014-06-18: qty 10

## 2014-06-18 MED ORDER — IPRATROPIUM-ALBUTEROL 0.5-2.5 (3) MG/3ML IN SOLN: 3.0000 mL | Freq: Four times a day (QID) | RESPIRATORY_TRACT | Status: DC | PRN

## 2014-06-18 MED ORDER — LEVOTHYROXINE SODIUM 50 MCG PO TABS
50.0000 ug | ORAL_TABLET | Freq: Every day | ORAL | Status: DC
Start: 2014-06-18 — End: 2014-12-30

## 2014-06-18 MED ORDER — FUROSEMIDE 40 MG PO TABS: 40.0000 mg | ORAL_TABLET | Freq: Every day | ORAL | Status: DC

## 2014-06-18 MED ORDER — CARVEDILOL 6.25 MG PO TABS: 6.2500 mg | ORAL_TABLET | Freq: Two times a day (BID) | ORAL | Status: DC

## 2014-06-18 MED ORDER — GABAPENTIN 300 MG PO CAPS: 300.0000 mg | ORAL_CAPSULE | Freq: Two times a day (BID) | ORAL | Status: DC

## 2014-06-18 MED ORDER — RIVAROXABAN 20 MG PO TABS: 20.0000 mg | ORAL_TABLET | ORAL | Status: DC

## 2014-06-18 MED ORDER — DILTIAZEM HCL ER COATED BEADS 120 MG PO CP24: 120.0000 mg | ORAL_CAPSULE | Freq: Every day | ORAL | Status: DC

## 2014-06-18 MED ORDER — GLIPIZIDE ER 10 MG PO TB24: 10.0000 mg | ORAL_TABLET | Freq: Every day | ORAL | Status: DC

## 2014-06-18 NOTE — Progress Notes (Signed)
  RD consulted for nutrition education regarding noncompliance with diabetes management.    Lab Results  Component Value Date   HGBA1C 8.1* 02/02/2014    Pt reports she has received diabetes diet education in the past; she reports high blood glucose was related to running out of medications. Reviewed pt's dietary recall. She reorts that she usually eats just one low carb meal daily and primarily drinks water. She reports that she at times over eats pasta, rice, or chocolate. RD provided "My Plate Method" handout. Reviewed recommended serving sizes of common foods and provided sample balanced meals.  Pt reports following a no added salt diet but, reports eating bacon and sausage. Reviewed sodium restriction, high sodium foods to avoid, tips for decreasing sodium, and what to look for on a nutrition label.  Discussed importance of controlled and consistent carbohydrate intake throughout the day. Provided examples of ways to balance meals/snacks and encouraged intake of high-fiber, whole grain complex carbohydrates. Teach back method used.  Expect fair compliance.  Body mass index is 36.88 kg/(m^2). Pt meets criteria for Obesity based on current BMI.  Current diet order is Carb Modified, patient is consuming approximately 100% of meals at this time. Labs and medications reviewed. No further nutrition interventions warranted at this time. RD contact information provided. If additional nutrition issues arise, please re-consult RD.  Pryor Ochoa RD, LDN Inpatient Clinical Dietitian Pager: [phone] After Hours Pager: [phone]

## 2014-06-18 NOTE — Progress Notes (Signed)
[name] to be D/C'd Home per MD order.  Discussed with the patient and all questions fully answered.    Medication List    STOP taking these medications       DAILY VITE PO     insulin aspart 100 UNIT/ML injection  Commonly known as:  novoLOG     insulin glargine 100 UNIT/ML injection  Commonly known as:  LANTUS     magnesium oxide 400 MG tablet  Commonly known as:  MAG-OX     oxyCODONE 5 MG immediate release tablet  Commonly known as:  Oxy IR/ROXICODONE      TAKE these medications       ACCU-CHEK AVIVA PLUS test strip  Generic drug:  glucose blood  1 each by Other route See admin instructions. Check blood sugar twice daily.     ACCU-CHEK SOFTCLIX LANCETS lancets  1 each by Other route See admin instructions. Check blood sugar twice daily.     albuterol 108 (90 BASE) MCG/ACT inhaler  Commonly known as:  PROVENTIL HFA;VENTOLIN HFA  Inhale 2 puffs into the lungs every 4 (four) hours as needed for wheezing or shortness of breath.     aspirin EC 81 MG tablet  Take 1 tablet (81 mg total) by mouth daily.     carvedilol 6.25 MG tablet  Commonly known as:  COREG  Take 1 tablet (6.25 mg total) by mouth 2 (two) times daily with a meal.     clopidogrel 75 MG tablet  Commonly known as:  PLAVIX  Take 1 tablet (75 mg total) by mouth daily with breakfast.     diltiazem 120 MG 24 hr capsule  Commonly known as:  CARDIZEM CD  Take 1 capsule (120 mg total) by mouth daily.     furosemide 40 MG tablet  Commonly known as:  LASIX  Take 1 tablet (40 mg total) by mouth daily.     gabapentin 300 MG capsule  Commonly known as:  NEURONTIN  Take 1 capsule (300 mg total) by mouth 2 (two) times daily.     glipiZIDE 10 MG 24 hr tablet  Commonly known as:  GLUCOTROL XL  Take 1 tablet (10 mg total) by mouth daily with breakfast.     insulin NPH-regular Human (70-30) 100 UNIT/ML injection  Commonly known as:  NOVOLIN 70/30  Inject 35 Units into the skin 2 (two) times daily with a  meal.     ipratropium-albuterol 0.5-2.5 (3) MG/3ML Soln  Commonly known as:  DUONEB  Take 3 mLs by nebulization every 6 (six) hours as needed (for shortness of breath).     levothyroxine 50 MCG tablet  Commonly known as:  SYNTHROID, LEVOTHROID  Take 1 tablet (50 mcg total) by mouth daily.     lisinopril 10 MG tablet  Commonly known as:  PRINIVIL,ZESTRIL  Take 1 tablet (10 mg total) by mouth daily.     pentoxifylline 400 MG CR tablet  Commonly known as:  TRENTAL  Take 1 tablet (400 mg total) by mouth 2 (two) times daily.     rivaroxaban 20 MG Tabs tablet  Commonly known as:  XARELTO  Take 1 tablet (20 mg total) by mouth every morning.        VVS, Skin clean, dry and intact without evidence of skin break down, no evidence of skin tears noted. IV catheter discontinued intact. Site without signs and symptoms of complications. Dressing and pressure applied.  An After Visit Summary was printed and given to  the patient.  D/c education completed with patient/family including follow up instructions, medication list, d/c activities limitations if indicated, with other d/c instructions as indicated by MD - patient able to verbalize understanding, all questions fully answered.   Patient instructed to return to ED, call 911, or call MD for any changes in condition.   Patient escorted via [place], and D/C home via private auto.  Wonda Cerise D 06/18/2014 12:31 PM

## 2014-06-18 NOTE — Discharge Summary (Signed)
PATIENT DETAILS Name: [name] Age: 53 y.o. Sex: female Date of Birth: [birth year] MRN: [id number]. Admit Date: 06/16/2014 Admitting Physician: Domenic Polite, MD [id number], Gabrielle Dare, MD  Recommendations for Outpatient Follow-up:  1. Discharge to home; restart taking home medications 2. Begin taking Novolog 70/30 in place of Lantus and home Novolog 3. Follow up with [facility] on 8/7 with regards to new insulin, home medications, and tobacco cessation  PRIMARY DISCHARGE DIAGNOSIS:  Principal Problem:   Atrial fibrillation with RVR Active Problems:   COPD (chronic obstructive pulmonary disease)   Tobacco abuse   DM (diabetes mellitus), type 2 with peripheral vascular complications   History of cocaine abuse   DKA (diabetic ketoacidoses)   Acute on chronic diastolic CHF (congestive heart failure), NYHA class 3       PAST MEDICAL HISTORY: Past Medical History  Diagnosis Date  . COPD (chronic obstructive pulmonary disease)   . Alcohol abuse   . Narcotic abuse   . Cocaine abuse   . Marijuana abuse   . Poorly controlled diabetes mellitus   . COPD (chronic obstructive pulmonary disease)   . Tobacco abuse   . Heart failure   . Alcoholic cirrhosis   . Cardiomyopathy   . Peripheral arterial disease   . Obesity   . CHF (congestive heart failure)   . Hypothyroidism   . GERD (gastroesophageal reflux disease)   . Headache(784.0)     "@ least once or twice/wk" (09/10/2013)  . Arthritis     "hands, arms, shoulders, legs, back" (09/10/2013)  . Atrial fibrillation   . Critical lower limb ischemia   . Peripheral arterial disease     DISCHARGE MEDICATIONS:   Medication List    STOP taking these medications       DAILY VITE PO     insulin aspart 100 UNIT/ML injection  Commonly known as:  novoLOG     insulin glargine 100 UNIT/ML injection  Commonly known as:  LANTUS     magnesium oxide 400 MG tablet  Commonly known as:  MAG-OX     oxyCODONE  5 MG immediate release tablet  Commonly known as:  Oxy IR/ROXICODONE      TAKE these medications       ACCU-CHEK AVIVA PLUS test strip  Generic drug:  glucose blood  1 each by Other route See admin instructions. Check blood sugar twice daily.     ACCU-CHEK SOFTCLIX LANCETS lancets  1 each by Other route See admin instructions. Check blood sugar twice daily.     albuterol 108 (90 BASE) MCG/ACT inhaler  Commonly known as:  PROVENTIL HFA;VENTOLIN HFA  Inhale 2 puffs into the lungs every 4 (four) hours as needed for wheezing or shortness of breath.     aspirin EC 81 MG tablet  Take 1 tablet (81 mg total) by mouth daily.     carvedilol 6.25 MG tablet  Commonly known as:  COREG  Take 1 tablet (6.25 mg total) by mouth 2 (two) times daily with a meal.     clopidogrel 75 MG tablet  Commonly known as:  PLAVIX  Take 1 tablet (75 mg total) by mouth daily with breakfast.     diltiazem 120 MG 24 hr capsule  Commonly known as:  CARDIZEM CD  Take 1 capsule (120 mg total) by mouth daily.     furosemide 40 MG tablet  Commonly known as:  LASIX  Take 1 tablet (40 mg total) by mouth daily.  gabapentin 300 MG capsule  Commonly known as:  NEURONTIN  Take 1 capsule (300 mg total) by mouth 2 (two) times daily.     glipiZIDE 10 MG 24 hr tablet  Commonly known as:  GLUCOTROL XL  Take 1 tablet (10 mg total) by mouth daily with breakfast.     insulin NPH-regular Human (70-30) 100 UNIT/ML injection  Commonly known as:  NOVOLIN 70/30  Inject 35 Units into the skin 2 (two) times daily with a meal.     ipratropium-albuterol 0.5-2.5 (3) MG/3ML Soln  Commonly known as:  DUONEB  Take 3 mLs by nebulization every 6 (six) hours as needed (for shortness of breath).     levothyroxine 50 MCG tablet  Commonly known as:  SYNTHROID, LEVOTHROID  Take 1 tablet (50 mcg total) by mouth daily.     lisinopril 10 MG tablet  Commonly known as:  PRINIVIL,ZESTRIL  Take 1 tablet (10 mg total) by mouth daily.      pentoxifylline 400 MG CR tablet  Commonly known as:  TRENTAL  Take 1 tablet (400 mg total) by mouth 2 (two) times daily.     rivaroxaban 20 MG Tabs tablet  Commonly known as:  XARELTO  Take 1 tablet (20 mg total) by mouth every morning.        ALLERGIES:  No Known Allergies  BRIEF HPI:  See H&P, Labs, Consult and Test reports for all details in brief. Patient with PMH of non-compliance, continued tobacco abuse, alcohol abuse, COPD, CHF, hypothyroidism, paroxysmal AFIB on Xarelto, PAD with critical limb ischemia, and DM2 presented w/SOB and chest discomfort and found to be in Afib with RVR; HRs in 200s. BG in ED >400.   CONSULTATIONS:   cardiology  PERTINENT RADIOLOGIC STUDIES: Dg Chest 2 View  06/06/2014   CLINICAL DATA:  Cough.  Shortness of breath.  EXAM: CHEST  2 VIEW  COMPARISON:  Chest x-ray 02/01/2014.  FINDINGS: Lung volumes are normal. No consolidative airspace disease. No pleural effusions. No pneumothorax. No pulmonary nodule or mass noted. Pulmonary vasculature and the cardiomediastinal silhouette are within normal limits.  IMPRESSION: No radiographic evidence of acute cardiopulmonary disease.   Electronically Signed   By: Vinnie Langton M.D.   On: 06/06/2014 12:43   Dg Chest Port 1 View  06/16/2014   CLINICAL DATA:  Shortness breath.  Chest pain.  EXAM: PORTABLE CHEST - 1 VIEW  COMPARISON:  06/06/2014.  FINDINGS: Cardiomegaly.  Central pulmonary vascular prominence.  No gross pneumothorax or segmental consolidation.  Calcified aorta.  IMPRESSION: Cardiomegaly.  Central pulmonary vascular prominence.  Calcified aorta.   Electronically Signed   By: Chauncey Cruel M.D.   On: 06/16/2014 11:05     PERTINENT LAB RESULTS: CBC:  Recent Labs  06/16/14 1050 06/17/14 0810  WBC 15.7* 13.3*  HGB 12.5 11.7*  HCT 38.8 36.9  PLT 342 260   CMET CMP     Component Value Date/Time   NA 134* 06/18/2014 0908   K 3.9 06/18/2014 0908   CL 95* 06/18/2014 0908   CO2 27 06/18/2014 0908    GLUCOSE 178* 06/18/2014 0908   BUN 23 06/18/2014 0908   CREATININE 0.77 06/18/2014 0908   CREATININE 0.90 09/06/2013 0937   CALCIUM 8.7 06/18/2014 0908   PROT 6.9 06/16/2014 1050   ALBUMIN 2.9* 06/16/2014 1050   AST 22 06/16/2014 1050   ALT 24 06/16/2014 1050   ALKPHOS 143* 06/16/2014 1050   BILITOT <0.2* 06/16/2014 1050   GFRNONAA >90 06/18/2014 [id number]  GFRAA >90 06/18/2014 0908    GFR Estimated Creatinine Clearance: 94.3 ml/min (by C-G formula based on Cr of 0.77). No results found for this basename: LIPASE, AMYLASE,  in the last 72 hours  Recent Labs  06/16/14 [place] <0.30   BRIEF HOSPITAL COURSE:   Primary Problems: Atrial fibrillation with RVR  - Started on diltiazem drip, converted to SR.  - Continue Coreg and Xarelto on discharge - cardiac enzymes unremarkable  DKA (diabetic ketoacidoses)/ DM (diabetes mellitus), type 2 with peripheral vascular complications:  - Started on IV insulin then transitioned to long acting and short acting insulin. Continued to titrate long actin insulin-unfortunately cannot afford Lantus-will switch over to Insulin 70/30 on discharge.  - Due to noncompliance.  - CBG's ACHS. Transfer to regular floor.   Active Problems: Acute on chronic diastolic CHF (congestive heart failure), NYHA class 3/Essential HTN:  - Estimated dry weight ~ 69 kg. More compensated by time of discharge. -Managed with IV lasix, restrict fluid, daily weights, apply ted hose while inpatient. Resume Lasix,ACEI,Coreg on discharge. Have emphasized importance of compliance.Has appt with cardiology scheduled.  PAD - History of TurboHawk atherectomy of right SFA 09/10/13 with two-vessel runoff for critical limb ischemia in a properly healing ulcer on the right heel. Her followup lower extremity arterial Doppler study performed 09/25/13 revealed a right ABI of 0.5 to with a patent right SFA which had been occluded at prior Doppler 08/13/13.  - Continue Plavix on discharge  COPD - nebulizer  PRN  Tobacco Abuse - counseled   HTN: -controlled-c/w Lisinopril,Coreg on discharge  TODAY-DAY OF DISCHARGE:  Subjective:   [name] today has no headache,no chest abdominal pain,no new weakness tingling or numbness, feels much better wants to go home today.  Objective:   Blood pressure 135/84, pulse 93, temperature 100.2 F (37.9 C), temperature source Oral, resp. rate 20, height 5\' 4"  (1.626 m), weight 97.5 kg (214 lb 15.2 oz), last menstrual period 11/27/2012, SpO2 92.00%.  Intake/Output Summary (Last 24 hours) at 06/18/14 1137 Last data filed at 06/18/14 0858  Gross per 24 hour  Intake    680 ml  Output    400 ml  Net    280 ml   Filed Weights   06/16/14 1028 06/17/14 0000  Weight: 95.255 kg (210 lb) 97.5 kg (214 lb 15.2 oz)    Exam Awake Alert, Oriented *3, No new F.N deficits, Normal affect [place].AT,PERRAL Supple Neck,No JVD, No cervical lymphadenopathy appreciated.  Symmetrical Chest wall movement, Good air movement bilaterally, CTAB RRR,No Gallops,Rubs or new Murmurs, No Parasternal Heave + B.Sounds, Abd Soft, Non tender, No organomegaly appriceated, No rebound -guarding or rigidity. No Cyanosis, Clubbing, No new Rash or bruise. Wearing compression stockings, no signs of extremity edema.  DISCHARGE CONDITION: Stable  DISPOSITION: Home  DISCHARGE INSTRUCTIONS:    Activity:  As tolerated   Diet recommendation: Heart Healthy/Diabetic diet  Discharge Instructions   Diet - low sodium heart healthy    Complete by:  As directed      Increase activity slowly    Complete by:  As directed            Follow-up Information   Follow up with Angelica Chessman, MD.   Specialty:  Internal Medicine   Contact information:   [address] [place] [address] [phone]       Follow up with [facility]     On 06/20/2014. ([facility] application appointment 06/20/2014)  Contact information:   [address] [place] [address] [phone]      Follow up with Lyda Jester, PA-C On 07/07/2014. ([place].   2:15PM)    Specialty:  Cardiology   Contact information:   [place] [place] [address] [phone]         Total Time spent on discharge equals 45 minutes.  Signed: [facility]  06/18/2014 11:37 AM  **Disclaimer: This note may have been dictated with voice recognition software. Similar sounding words can inadvertently be transcribed and this note may contain transcription errors which may not have been corrected upon publication of note.**  Attending Patient was seen, examined,treatment plan was discussed with the Physician extender. I have directly reviewed the clinical findings, lab, imaging studies and management of this patient in detail. I have made the necessary changes to the above noted documentation, and agree with the documentation, as recorded by the Physician extender.  Nena Alexander MD [facility].

## 2014-06-19 NOTE — ED Provider Notes (Signed)
06/06/14  Medical screening examination/treatment/procedure(s) were performed by non-physician practitioner and as supervising physician I was immediately available for consultation/collaboration.   EKG Interpretation None        [place], DO 06/19/14 [phone]

## 2014-06-25 ENCOUNTER — Inpatient Hospital Stay: Payer: Self-pay

## 2014-07-01 ENCOUNTER — Emergency Department (HOSPITAL_COMMUNITY)
Admission: EM | Admit: 2014-07-01 | Discharge: 2014-07-01 | Disposition: A | Discharge status: Home / Self Care | Payer: Medicaid Other | Attending: Emergency Medicine | Admitting: Emergency Medicine

## 2014-07-01 ENCOUNTER — Encounter (HOSPITAL_COMMUNITY): Payer: Self-pay | Admitting: Emergency Medicine

## 2014-07-01 DIAGNOSIS — I509 Heart failure, unspecified: Secondary | ICD-10-CM | POA: Diagnosis not present

## 2014-07-01 DIAGNOSIS — F17200 Nicotine dependence, unspecified, uncomplicated: Secondary | ICD-10-CM | POA: Insufficient documentation

## 2014-07-01 DIAGNOSIS — M545 Low back pain, unspecified: Secondary | ICD-10-CM | POA: Diagnosis not present

## 2014-07-01 DIAGNOSIS — Z7982 Long term (current) use of aspirin: Secondary | ICD-10-CM | POA: Diagnosis not present

## 2014-07-01 DIAGNOSIS — J449 Chronic obstructive pulmonary disease, unspecified: Secondary | ICD-10-CM | POA: Diagnosis not present

## 2014-07-01 DIAGNOSIS — I4891 Unspecified atrial fibrillation: Secondary | ICD-10-CM | POA: Insufficient documentation

## 2014-07-01 DIAGNOSIS — Z7901 Long term (current) use of anticoagulants: Secondary | ICD-10-CM | POA: Insufficient documentation

## 2014-07-01 DIAGNOSIS — Z79899 Other long term (current) drug therapy: Secondary | ICD-10-CM | POA: Diagnosis not present

## 2014-07-01 DIAGNOSIS — M129 Arthropathy, unspecified: Secondary | ICD-10-CM | POA: Diagnosis not present

## 2014-07-01 DIAGNOSIS — G8929 Other chronic pain: Secondary | ICD-10-CM | POA: Diagnosis not present

## 2014-07-01 DIAGNOSIS — Z8719 Personal history of other diseases of the digestive system: Secondary | ICD-10-CM | POA: Insufficient documentation

## 2014-07-01 DIAGNOSIS — E039 Hypothyroidism, unspecified: Secondary | ICD-10-CM | POA: Insufficient documentation

## 2014-07-01 DIAGNOSIS — J4489 Other specified chronic obstructive pulmonary disease: Secondary | ICD-10-CM | POA: Insufficient documentation

## 2014-07-01 DIAGNOSIS — E669 Obesity, unspecified: Secondary | ICD-10-CM | POA: Diagnosis not present

## 2014-07-01 DIAGNOSIS — Z794 Long term (current) use of insulin: Secondary | ICD-10-CM | POA: Diagnosis not present

## 2014-07-01 DIAGNOSIS — E119 Type 2 diabetes mellitus without complications: Secondary | ICD-10-CM | POA: Diagnosis not present

## 2014-07-01 LAB — URINE MICROSCOPIC-ADD ON

## 2014-07-01 LAB — URINALYSIS, ROUTINE W REFLEX MICROSCOPIC
BILIRUBIN URINE: NEGATIVE
Ketones, ur: NEGATIVE mg/dL
LEUKOCYTES UA: NEGATIVE
Nitrite: NEGATIVE
PH: 6 (ref 5.0–8.0)
Protein, ur: 100 mg/dL — AB
Specific Gravity, Urine: 1.026 (ref 1.005–1.030)
Urobilinogen, UA: 0.2 mg/dL (ref 0.0–1.0)

## 2014-07-01 MED ORDER — OXYCODONE HCL 5 MG PO TABS: 5.0000 mg | ORAL_TABLET | Freq: Four times a day (QID) | ORAL | Status: DC | PRN

## 2014-07-01 MED ORDER — OXYCODONE HCL 5 MG PO TABS
5.0000 mg | ORAL_TABLET | Freq: Once | ORAL | Status: AC
  Administered 2014-07-01: 5 mg via ORAL
  Filled 2014-07-01: qty 1

## 2014-07-01 NOTE — Discharge Instructions (Signed)

## 2014-07-01 NOTE — ED Notes (Signed)
NP at the bedside

## 2014-07-01 NOTE — ED Provider Notes (Signed)
CSN: [id number]     Arrival date & time 07/01/14  1628 History  This chart was scribed for non-physician practitioner, Etta Quill, NP working with Wandra Arthurs, MD by Frederich Balding, ED scribe. This patient was seen in room TR11C/TR11C and the patient's care was started at 6:22 PM.    Chief Complaint  Patient presents with  . Back Pain   The history is provided by the patient. No language interpreter was used.   HPI Comments: [name] is a 53 y.o. female with history of diabetes who presents to the Emergency Department complaining of chronic mid to lower back pain that radiates into her legs. She states the back pain radiates around to her right abdomen but denies any actual abdominal pain. Denies any new injury. Reports diabetic neuropathy in her legs. States he has been unable to see the pain clinic for the last month so he is out of pain medications. Pt has taken OTC back and body medication with no relief. Denies bowel or bladder incontinence, numbness or tingling.   Past Medical History  Diagnosis Date  . COPD (chronic obstructive pulmonary disease)   . Alcohol abuse   . Narcotic abuse   . Cocaine abuse   . Marijuana abuse   . Poorly controlled diabetes mellitus   . COPD (chronic obstructive pulmonary disease)   . Tobacco abuse   . Heart failure   . Alcoholic cirrhosis   . Cardiomyopathy   . Peripheral arterial disease   . Obesity   . CHF (congestive heart failure)   . Hypothyroidism   . GERD (gastroesophageal reflux disease)   . Headache(784.0)     "@ least once or twice/wk" (09/10/2013)  . Arthritis     "hands, arms, shoulders, legs, back" (09/10/2013)  . Atrial fibrillation   . Critical lower limb ischemia   . Peripheral arterial disease    Past Surgical History  Procedure Laterality Date  . Lower extremity angiogram Right 09/10/2013    turbo hawk directional arthrectomy of a diffusely diseased SFA/notes 09/10/2013   . Cardioversion  ~ 02/2013    "twice"  (09/10/2013)   Family History  Problem Relation Age of Onset  . Hypertension Mother   . Diabetes Mother   . Cancer Mother     breast, ovarian, colon  . Hypertension Father    History  Substance Use Topics  . Smoking status: Current Every Day Smoker -- 1.50 packs/day for 38 years    Types: Cigarettes  . Smokeless tobacco: Never Used  . Alcohol Use: 0.6 oz/week    1 Cans of beer per week     Comment: 09/10/2013 "might drink 1 beer/wk; never had problem w/it"   OB History   Grav Para Term Preterm Abortions TAB SAB Ect Mult Living   1 1 1       1      Review of Systems  Gastrointestinal: Negative for abdominal pain.  Genitourinary:       Negative for bowel or bladder incontinence.   Musculoskeletal: Positive for back pain and myalgias.  Neurological: Negative for numbness.  All other systems reviewed and are negative.  Allergies  Review of patient's allergies indicates no known allergies.  Home Medications   Prior to Admission medications   Medication Sig Start Date End Date Taking? Authorizing Provider  albuterol (PROVENTIL HFA;VENTOLIN HFA) 108 (90 BASE) MCG/ACT inhaler Inhale 2 puffs into the lungs every 4 (four) hours as needed for wheezing or shortness of breath.  Yes Historical Provider, MD  aspirin EC 81 MG tablet Take 1 tablet (81 mg total) by mouth daily. 07/31/13  Yes Renella Cunas, MD  carvedilol (COREG) 6.25 MG tablet Take 1 tablet (6.25 mg total) by mouth 2 (two) times daily with a meal. 06/18/14  Yes Jonetta Osgood, MD  clopidogrel (PLAVIX) 75 MG tablet Take 1 tablet (75 mg total) by mouth daily with breakfast. 06/18/14  Yes Shanker Kristeen Mans, MD  diltiazem (CARDIZEM CD) 120 MG 24 hr capsule Take 1 capsule (120 mg total) by mouth daily. 06/18/14  Yes Shanker Kristeen Mans, MD  furosemide (LASIX) 40 MG tablet Take 1 tablet (40 mg total) by mouth daily. 06/18/14  Yes Shanker Kristeen Mans, MD  gabapentin (NEURONTIN) 300 MG capsule Take 1 capsule (300 mg total) by mouth 2 (two)  times daily. 06/18/14  Yes Shanker Kristeen Mans, MD  glipiZIDE (GLUCOTROL XL) 10 MG 24 hr tablet Take 1 tablet (10 mg total) by mouth daily with breakfast. 06/18/14  Yes Shanker Kristeen Mans, MD  insulin NPH-regular Human (NOVOLIN 70/30) (70-30) 100 UNIT/ML injection Inject 35 Units into the skin 2 (two) times daily with a meal. 06/18/14  Yes Shanker Kristeen Mans, MD  ipratropium-albuterol (DUONEB) 0.5-2.5 (3) MG/3ML SOLN Take 3 mLs by nebulization every 6 (six) hours as needed (for shortness of breath). 06/18/14  Yes Shanker Kristeen Mans, MD  levothyroxine (SYNTHROID, LEVOTHROID) 50 MCG tablet Take 1 tablet (50 mcg total) by mouth daily. 06/18/14  Yes Shanker Kristeen Mans, MD  lisinopril (PRINIVIL,ZESTRIL) 10 MG tablet Take 1 tablet (10 mg total) by mouth daily. 06/18/14  Yes Shanker Kristeen Mans, MD  pentoxifylline (TRENTAL) 400 MG CR tablet Take 1 tablet (400 mg total) by mouth 2 (two) times daily. 06/18/14  Yes Shanker Kristeen Mans, MD  rivaroxaban (XARELTO) 20 MG TABS tablet Take 1 tablet (20 mg total) by mouth every morning. 06/18/14  Yes Shanker Kristeen Mans, MD  ACCU-CHEK SOFTCLIX LANCETS lancets 1 each by Other route See admin instructions. Check blood sugar twice daily.    Historical Provider, MD  glucose blood (ACCU-CHEK AVIVA PLUS) test strip 1 each by Other route See admin instructions. Check blood sugar twice daily.    Historical Provider, MD   BP 170/86  Pulse 95  Temp(Src) 98.1 F (36.7 C) (Oral)  Resp 18  SpO2 99%  LMP 11/27/2012  Physical Exam  Nursing note and vitals reviewed. Constitutional: She is oriented to person, place, and time. She appears well-developed and well-nourished. No distress.  HENT:  Head: Normocephalic and atraumatic.  Eyes: Conjunctivae and EOM are normal.  Neck: Neck supple. No tracheal deviation present.  Cardiovascular: Normal rate, regular rhythm, normal heart sounds and intact distal pulses.   Pulmonary/Chest: Effort normal and breath sounds normal. No respiratory distress. She has no  wheezes. She has no rales.  Abdominal: Soft. Bowel sounds are normal. There is no tenderness.  Musculoskeletal: Normal range of motion.  Midline lumbar tenderness. Sensation and strength intact. Moves all extremities without problem.   Neurological: She is alert and oriented to person, place, and time.  Skin: Skin is warm and dry.  Psychiatric: She has a normal mood and affect. Her behavior is normal.    ED Course  Procedures (including critical care time)  DIAGNOSTIC STUDIES: Oxygen Saturation is 99% on RA, normal by my interpretation.    COORDINATION OF CARE: 6:25 PM-Discussed treatment plan which includes UA with pt at bedside and pt agreed to plan.   Labs Review  Labs Reviewed - No data to display  Imaging Review No results found.   EKG Interpretation None      MDM   Final diagnoses:  None    Chronic back pain. No red flag symptoms.  Limited pain medication provided.  Stressed importance of follow-up with her pain management provider for continuing care.  I personally performed the services described in this documentation, which was scribed in my presence. The recorded information has been reviewed and is accurate.  Norman Herrlich, NP 07/02/14 [phone]

## 2014-07-01 NOTE — ED Notes (Signed)
Pt c/o chronic mid to lower back pain with radiation down legs per norm for chronic pain; pt sts unable to see pain clinic x 1 month and out of pain meds; pt denies new injury

## 2014-07-02 NOTE — ED Provider Notes (Signed)
Medical screening examination/treatment/procedure(s) were performed by non-physician practitioner and as supervising physician I was immediately available for consultation/collaboration.   EKG Interpretation None        Wandra Arthurs, MD 07/02/14 1038

## 2014-07-07 ENCOUNTER — Ambulatory Visit: Payer: Self-pay | Admitting: Cardiology

## 2014-07-08 ENCOUNTER — Other Ambulatory Visit: Payer: Self-pay | Admitting: Internal Medicine

## 2014-07-22 ENCOUNTER — Other Ambulatory Visit (HOSPITAL_COMMUNITY): Payer: Self-pay | Admitting: Primary Care

## 2014-07-22 DIAGNOSIS — Z1231 Encounter for screening mammogram for malignant neoplasm of breast: Secondary | ICD-10-CM

## 2014-08-05 ENCOUNTER — Other Ambulatory Visit: Payer: Self-pay | Admitting: Internal Medicine

## 2014-08-18 ENCOUNTER — Ambulatory Visit: Payer: Medicaid Other

## 2014-08-26 ENCOUNTER — Other Ambulatory Visit: Payer: Self-pay | Admitting: Internal Medicine

## 2014-09-02 ENCOUNTER — Ambulatory Visit: Payer: Medicaid Other | Admitting: Cardiovascular Disease

## 2014-09-04 ENCOUNTER — Other Ambulatory Visit: Payer: Self-pay | Admitting: Internal Medicine

## 2014-09-04 ENCOUNTER — Ambulatory Visit (HOSPITAL_COMMUNITY): Payer: Medicaid Other

## 2014-09-04 ENCOUNTER — Ambulatory Visit (HOSPITAL_COMMUNITY)
Admission: RE | Admit: 2014-09-04 | Discharge: 2014-09-04 | Disposition: A | Discharge status: Home / Self Care | Payer: Medicaid Other | Source: Ambulatory Visit | Attending: Primary Care | Admitting: Primary Care

## 2014-09-04 DIAGNOSIS — Z1231 Encounter for screening mammogram for malignant neoplasm of breast: Secondary | ICD-10-CM | POA: Insufficient documentation

## 2014-09-07 ENCOUNTER — Emergency Department (HOSPITAL_COMMUNITY): Payer: Medicaid Other

## 2014-09-07 ENCOUNTER — Encounter (HOSPITAL_COMMUNITY): Payer: Self-pay | Admitting: Emergency Medicine

## 2014-09-07 ENCOUNTER — Inpatient Hospital Stay (HOSPITAL_COMMUNITY)
Admission: EM | Admit: 2014-09-07 | Discharge: 2014-09-09 | DRG: 291 | Disposition: A | Discharge status: Home / Self Care | Payer: Medicaid Other | Attending: Internal Medicine | Admitting: Internal Medicine

## 2014-09-07 DIAGNOSIS — E039 Hypothyroidism, unspecified: Secondary | ICD-10-CM | POA: Diagnosis present

## 2014-09-07 DIAGNOSIS — J9601 Acute respiratory failure with hypoxia: Secondary | ICD-10-CM | POA: Diagnosis present

## 2014-09-07 DIAGNOSIS — Y95 Nosocomial condition: Secondary | ICD-10-CM | POA: Diagnosis present

## 2014-09-07 DIAGNOSIS — J209 Acute bronchitis, unspecified: Secondary | ICD-10-CM | POA: Diagnosis present

## 2014-09-07 DIAGNOSIS — I429 Cardiomyopathy, unspecified: Secondary | ICD-10-CM | POA: Diagnosis present

## 2014-09-07 DIAGNOSIS — J189 Pneumonia, unspecified organism: Secondary | ICD-10-CM | POA: Diagnosis present

## 2014-09-07 DIAGNOSIS — K7030 Alcoholic cirrhosis of liver without ascites: Secondary | ICD-10-CM | POA: Diagnosis present

## 2014-09-07 DIAGNOSIS — Z794 Long term (current) use of insulin: Secondary | ICD-10-CM

## 2014-09-07 DIAGNOSIS — F17210 Nicotine dependence, cigarettes, uncomplicated: Secondary | ICD-10-CM | POA: Diagnosis present

## 2014-09-07 DIAGNOSIS — E1159 Type 2 diabetes mellitus with other circulatory complications: Secondary | ICD-10-CM

## 2014-09-07 DIAGNOSIS — Z7982 Long term (current) use of aspirin: Secondary | ICD-10-CM | POA: Diagnosis not present

## 2014-09-07 DIAGNOSIS — Z9114 Patient's other noncompliance with medication regimen: Secondary | ICD-10-CM | POA: Diagnosis present

## 2014-09-07 DIAGNOSIS — Z6838 Body mass index (BMI) 38.0-38.9, adult: Secondary | ICD-10-CM | POA: Diagnosis not present

## 2014-09-07 DIAGNOSIS — E1151 Type 2 diabetes mellitus with diabetic peripheral angiopathy without gangrene: Secondary | ICD-10-CM | POA: Diagnosis present

## 2014-09-07 DIAGNOSIS — M1990 Unspecified osteoarthritis, unspecified site: Secondary | ICD-10-CM | POA: Diagnosis present

## 2014-09-07 DIAGNOSIS — F1210 Cannabis abuse, uncomplicated: Secondary | ICD-10-CM | POA: Diagnosis present

## 2014-09-07 DIAGNOSIS — I5033 Acute on chronic diastolic (congestive) heart failure: Secondary | ICD-10-CM | POA: Diagnosis present

## 2014-09-07 DIAGNOSIS — Z7901 Long term (current) use of anticoagulants: Secondary | ICD-10-CM

## 2014-09-07 DIAGNOSIS — E1165 Type 2 diabetes mellitus with hyperglycemia: Secondary | ICD-10-CM | POA: Diagnosis present

## 2014-09-07 DIAGNOSIS — R0602 Shortness of breath: Secondary | ICD-10-CM

## 2014-09-07 DIAGNOSIS — K219 Gastro-esophageal reflux disease without esophagitis: Secondary | ICD-10-CM | POA: Diagnosis present

## 2014-09-07 DIAGNOSIS — I998 Other disorder of circulatory system: Secondary | ICD-10-CM | POA: Diagnosis present

## 2014-09-07 DIAGNOSIS — R0609 Other forms of dyspnea: Secondary | ICD-10-CM | POA: Diagnosis present

## 2014-09-07 DIAGNOSIS — I480 Paroxysmal atrial fibrillation: Secondary | ICD-10-CM | POA: Diagnosis present

## 2014-09-07 DIAGNOSIS — J440 Chronic obstructive pulmonary disease with acute lower respiratory infection: Secondary | ICD-10-CM

## 2014-09-07 DIAGNOSIS — D649 Anemia, unspecified: Secondary | ICD-10-CM | POA: Diagnosis present

## 2014-09-07 DIAGNOSIS — Z9862 Peripheral vascular angioplasty status: Secondary | ICD-10-CM

## 2014-09-07 DIAGNOSIS — J441 Chronic obstructive pulmonary disease with (acute) exacerbation: Secondary | ICD-10-CM | POA: Diagnosis present

## 2014-09-07 DIAGNOSIS — E669 Obesity, unspecified: Secondary | ICD-10-CM | POA: Diagnosis present

## 2014-09-07 DIAGNOSIS — I4819 Other persistent atrial fibrillation: Secondary | ICD-10-CM

## 2014-09-07 DIAGNOSIS — E119 Type 2 diabetes mellitus without complications: Secondary | ICD-10-CM | POA: Diagnosis present

## 2014-09-07 DIAGNOSIS — R739 Hyperglycemia, unspecified: Secondary | ICD-10-CM

## 2014-09-07 DIAGNOSIS — E1169 Type 2 diabetes mellitus with other specified complication: Secondary | ICD-10-CM | POA: Diagnosis present

## 2014-09-07 DIAGNOSIS — I481 Persistent atrial fibrillation: Secondary | ICD-10-CM

## 2014-09-07 DIAGNOSIS — F1410 Cocaine abuse, uncomplicated: Secondary | ICD-10-CM | POA: Diagnosis present

## 2014-09-07 LAB — CBC WITH DIFFERENTIAL/PLATELET
Basophils Absolute: 0 10*3/uL (ref 0.0–0.1)
Basophils Relative: 0 % (ref 0–1)
EOS ABS: 0.3 10*3/uL (ref 0.0–0.7)
Eosinophils Relative: 2 % (ref 0–5)
HCT: 29.4 % — ABNORMAL LOW (ref 36.0–46.0)
Hemoglobin: 9.4 g/dL — ABNORMAL LOW (ref 12.0–15.0)
Lymphocytes Relative: 14 % (ref 12–46)
Lymphs Abs: 2.3 10*3/uL (ref 0.7–4.0)
MCH: 28.6 pg (ref 26.0–34.0)
MCHC: 32 g/dL (ref 30.0–36.0)
MCV: 89.4 fL (ref 78.0–100.0)
MONOS PCT: 6 % (ref 3–12)
Monocytes Absolute: 1.1 10*3/uL — ABNORMAL HIGH (ref 0.1–1.0)
NEUTROS PCT: 78 % — AB (ref 43–77)
Neutro Abs: 13.2 10*3/uL — ABNORMAL HIGH (ref 1.7–7.7)
Platelets: 408 10*3/uL — ABNORMAL HIGH (ref 150–400)
RBC: 3.29 MIL/uL — AB (ref 3.87–5.11)
RDW: 13.5 % (ref 11.5–15.5)
WBC: 16.9 10*3/uL — ABNORMAL HIGH (ref 4.0–10.5)

## 2014-09-07 LAB — BASIC METABOLIC PANEL
Anion gap: 14 (ref 5–15)
BUN: 14 mg/dL (ref 6–23)
CHLORIDE: 101 meq/L (ref 96–112)
CO2: 27 mEq/L (ref 19–32)
Calcium: 8.7 mg/dL (ref 8.4–10.5)
Creatinine, Ser: 0.9 mg/dL (ref 0.50–1.10)
GFR calc non Af Amer: 73 mL/min — ABNORMAL LOW (ref >90)
GFR, EST AFRICAN AMERICAN: 84 mL/min — AB (ref >90)
GLUCOSE: 123 mg/dL — AB (ref 70–99)
Potassium: 4.3 mEq/L (ref 3.7–5.3)
Sodium: 142 mEq/L (ref 137–147)

## 2014-09-07 LAB — COMPREHENSIVE METABOLIC PANEL
ALT: 12 U/L (ref 0–35)
ANION GAP: 14 (ref 5–15)
AST: 13 U/L (ref 0–37)
Albumin: 2.5 g/dL — ABNORMAL LOW (ref 3.5–5.2)
Alkaline Phosphatase: 145 U/L — ABNORMAL HIGH (ref 39–117)
BUN: 16 mg/dL (ref 6–23)
CO2: 26 mEq/L (ref 19–32)
Calcium: 8.8 mg/dL (ref 8.4–10.5)
Chloride: 97 mEq/L (ref 96–112)
Creatinine, Ser: 0.93 mg/dL (ref 0.50–1.10)
GFR calc non Af Amer: 70 mL/min — ABNORMAL LOW (ref >90)
GFR, EST AFRICAN AMERICAN: 81 mL/min — AB (ref >90)
Glucose, Bld: 235 mg/dL — ABNORMAL HIGH (ref 70–99)
POTASSIUM: 4.1 meq/L (ref 3.7–5.3)
Sodium: 137 mEq/L (ref 137–147)
TOTAL PROTEIN: 7.1 g/dL (ref 6.0–8.3)

## 2014-09-07 LAB — CBG MONITORING, ED
Glucose-Capillary: 244 mg/dL — ABNORMAL HIGH (ref 70–99)
Glucose-Capillary: 174 mg/dL — ABNORMAL HIGH (ref 70–99)

## 2014-09-07 LAB — I-STAT TROPONIN, ED: TROPONIN I, POC: 0.01 ng/mL (ref 0.00–0.08)

## 2014-09-07 LAB — PROTIME-INR
INR: 2.04 — ABNORMAL HIGH (ref 0.00–1.49)
Prothrombin Time: 23.2 seconds — ABNORMAL HIGH (ref 11.6–15.2)

## 2014-09-07 LAB — TSH: TSH: 1.34 u[IU]/mL (ref 0.350–4.500)

## 2014-09-07 LAB — GLUCOSE, CAPILLARY
GLUCOSE-CAPILLARY: 316 mg/dL — AB (ref 70–99)
Glucose-Capillary: 137 mg/dL — ABNORMAL HIGH (ref 70–99)
Glucose-Capillary: 273 mg/dL — ABNORMAL HIGH (ref 70–99)
Glucose-Capillary: 266 mg/dL — ABNORMAL HIGH (ref 70–99)

## 2014-09-07 LAB — TROPONIN I
Troponin I: <0.3 ng/mL (ref <0.30)
Troponin I: <0.3 ng/mL (ref <0.30)
Troponin I: <0.3 ng/mL (ref <0.30)

## 2014-09-07 LAB — PRO B NATRIURETIC PEPTIDE: Pro B Natriuretic peptide (BNP): 1423 pg/mL — ABNORMAL HIGH (ref 0–125)

## 2014-09-07 LAB — STREP PNEUMONIAE URINARY ANTIGEN: STREP PNEUMO URINARY ANTIGEN: NEGATIVE

## 2014-09-07 LAB — MAGNESIUM: Magnesium: 2.2 mg/dL (ref 1.5–2.5)

## 2014-09-07 MED ORDER — NICOTINE 21 MG/24HR TD PT24
21.0000 mg | MEDICATED_PATCH | Freq: Every day | TRANSDERMAL | Status: DC
  Administered 2014-09-07 – 2014-09-09 (×3): 21 mg via TRANSDERMAL
  Filled 2014-09-07 (×3): qty 1

## 2014-09-07 MED ORDER — LEVOTHYROXINE SODIUM 50 MCG PO TABS
50.0000 ug | ORAL_TABLET | Freq: Every day | ORAL | Status: DC
  Administered 2014-09-07 – 2014-09-09 (×3): 50 ug via ORAL
  Filled 2014-09-07 (×4): qty 1

## 2014-09-07 MED ORDER — VANCOMYCIN HCL 10 G IV SOLR
1500.0000 mg | Freq: Once | INTRAVENOUS | Status: AC
  Administered 2014-09-07: 1500 mg via INTRAVENOUS
  Filled 2014-09-07: qty 1500

## 2014-09-07 MED ORDER — ALBUTEROL SULFATE (2.5 MG/3ML) 0.083% IN NEBU: 2.5000 mg | INHALATION_SOLUTION | RESPIRATORY_TRACT | Status: DC | PRN

## 2014-09-07 MED ORDER — CARVEDILOL 6.25 MG PO TABS
6.2500 mg | ORAL_TABLET | Freq: Two times a day (BID) | ORAL | Status: DC
  Administered 2014-09-07 – 2014-09-08 (×2): 6.25 mg via ORAL
  Filled 2014-09-07 (×4): qty 1

## 2014-09-07 MED ORDER — HEPARIN SODIUM (PORCINE) 5000 UNIT/ML IJ SOLN: 5000.0000 [IU] | Freq: Three times a day (TID) | INTRAMUSCULAR | Status: DC

## 2014-09-07 MED ORDER — VANCOMYCIN HCL IN DEXTROSE 1-5 GM/200ML-% IV SOLN
1000.0000 mg | Freq: Two times a day (BID) | INTRAVENOUS | Status: DC
  Administered 2014-09-07 – 2014-09-08 (×3): 1000 mg via INTRAVENOUS
  Filled 2014-09-07 (×5): qty 200

## 2014-09-07 MED ORDER — VANCOMYCIN HCL IN DEXTROSE 750-5 MG/150ML-% IV SOLN
750.0000 mg | Freq: Once | INTRAVENOUS | Status: AC
  Administered 2014-09-07: 750 mg via INTRAVENOUS
  Filled 2014-09-07: qty 150

## 2014-09-07 MED ORDER — LISINOPRIL 20 MG PO TABS
20.0000 mg | ORAL_TABLET | Freq: Every day | ORAL | Status: DC
  Administered 2014-09-07: 20 mg via ORAL
  Filled 2014-09-07 (×2): qty 1

## 2014-09-07 MED ORDER — CLOPIDOGREL BISULFATE 75 MG PO TABS
75.0000 mg | ORAL_TABLET | Freq: Every day | ORAL | Status: DC
  Administered 2014-09-08 – 2014-09-09 (×2): 75 mg via ORAL
  Filled 2014-09-07 (×3): qty 1

## 2014-09-07 MED ORDER — INSULIN ASPART 100 UNIT/ML ~~LOC~~ SOLN
0.0000 [IU] | Freq: Three times a day (TID) | SUBCUTANEOUS | Status: DC
  Administered 2014-09-07 (×2): 5 [IU] via SUBCUTANEOUS
  Administered 2014-09-08: 2 [IU] via SUBCUTANEOUS

## 2014-09-07 MED ORDER — OXYCODONE HCL 5 MG PO TABS
5.0000 mg | ORAL_TABLET | Freq: Three times a day (TID) | ORAL | Status: DC
  Administered 2014-09-07 – 2014-09-09 (×7): 5 mg via ORAL
  Filled 2014-09-07 (×7): qty 1

## 2014-09-07 MED ORDER — RIVAROXABAN 20 MG PO TABS
20.0000 mg | ORAL_TABLET | Freq: Every day | ORAL | Status: DC
  Administered 2014-09-07 – 2014-09-08 (×2): 20 mg via ORAL
  Filled 2014-09-07 (×3): qty 1

## 2014-09-07 MED ORDER — FUROSEMIDE 10 MG/ML IJ SOLN
60.0000 mg | Freq: Two times a day (BID) | INTRAMUSCULAR | Status: DC
  Administered 2014-09-07 – 2014-09-09 (×5): 60 mg via INTRAVENOUS
  Filled 2014-09-07 (×4): qty 6

## 2014-09-07 MED ORDER — DEXTROSE 5 % IV SOLN
2.0000 g | Freq: Two times a day (BID) | INTRAVENOUS | Status: DC
  Administered 2014-09-07 – 2014-09-08 (×3): 2 g via INTRAVENOUS
  Filled 2014-09-07 (×6): qty 2

## 2014-09-07 MED ORDER — NITROGLYCERIN 2 % TD OINT
0.5000 [in_us] | TOPICAL_OINTMENT | Freq: Four times a day (QID) | TRANSDERMAL | Status: DC
  Administered 2014-09-07 – 2014-09-09 (×8): 0.5 [in_us] via TOPICAL
  Filled 2014-09-07: qty 30

## 2014-09-07 MED ORDER — FUROSEMIDE 10 MG/ML IJ SOLN
INTRAMUSCULAR | Status: AC
  Filled 2014-09-07: qty 8

## 2014-09-07 MED ORDER — INSULIN GLARGINE 100 UNIT/ML ~~LOC~~ SOLN
35.0000 [IU] | Freq: Every day | SUBCUTANEOUS | Status: DC
  Administered 2014-09-07: 35 [IU] via SUBCUTANEOUS
  Filled 2014-09-07 (×2): qty 0.35

## 2014-09-07 MED ORDER — SODIUM CHLORIDE 0.9 % IV SOLN: 15.0000 mg/kg | Freq: Once | INTRAVENOUS | Status: DC

## 2014-09-07 MED ORDER — PIPERACILLIN-TAZOBACTAM 3.375 G IVPB 30 MIN
3.3750 g | Freq: Once | INTRAVENOUS | Status: AC
  Administered 2014-09-07: 3.375 g via INTRAVENOUS
  Filled 2014-09-07: qty 50

## 2014-09-07 MED ORDER — FUROSEMIDE 10 MG/ML IJ SOLN: 40.0000 mg | Freq: Every day | INTRAMUSCULAR | Status: DC

## 2014-09-07 MED ORDER — DEXTROSE 5 % IV SOLN
1.0000 g | Freq: Three times a day (TID) | INTRAVENOUS | Status: DC
  Administered 2014-09-07: 1 g via INTRAVENOUS
  Filled 2014-09-07 (×2): qty 1

## 2014-09-07 MED ORDER — ALBUTEROL SULFATE (2.5 MG/3ML) 0.083% IN NEBU
2.5000 mg | INHALATION_SOLUTION | RESPIRATORY_TRACT | Status: DC
  Administered 2014-09-07 – 2014-09-09 (×10): 2.5 mg via RESPIRATORY_TRACT
  Filled 2014-09-07 (×12): qty 3

## 2014-09-07 MED ORDER — IPRATROPIUM BROMIDE 0.02 % IN SOLN
0.5000 mg | RESPIRATORY_TRACT | Status: DC
  Administered 2014-09-07 – 2014-09-09 (×10): 0.5 mg via RESPIRATORY_TRACT
  Filled 2014-09-07 (×12): qty 2.5

## 2014-09-07 MED ORDER — ONDANSETRON HCL 4 MG/2ML IJ SOLN
4.0000 mg | Freq: Four times a day (QID) | INTRAMUSCULAR | Status: DC | PRN
  Administered 2014-09-07 – 2014-09-08 (×2): 4 mg via INTRAVENOUS
  Filled 2014-09-07 (×2): qty 2

## 2014-09-07 MED ORDER — GABAPENTIN 300 MG PO CAPS
300.0000 mg | ORAL_CAPSULE | Freq: Two times a day (BID) | ORAL | Status: DC
  Administered 2014-09-07 – 2014-09-09 (×5): 300 mg via ORAL
  Filled 2014-09-07 (×6): qty 1

## 2014-09-07 MED ORDER — ASPIRIN EC 81 MG PO TBEC
81.0000 mg | DELAYED_RELEASE_TABLET | Freq: Every day | ORAL | Status: DC
  Administered 2014-09-07 – 2014-09-09 (×3): 81 mg via ORAL
  Filled 2014-09-07 (×3): qty 1

## 2014-09-07 MED ORDER — ALBUTEROL SULFATE (2.5 MG/3ML) 0.083% IN NEBU
5.0000 mg | INHALATION_SOLUTION | Freq: Once | RESPIRATORY_TRACT | Status: AC
  Administered 2014-09-07: 5 mg via RESPIRATORY_TRACT
  Filled 2014-09-07: qty 6

## 2014-09-07 NOTE — ED Notes (Signed)
CBG taken = 174

## 2014-09-07 NOTE — H&P (Signed)
[facility] History and Physical  [name] [id number] DOB: 10-16-1961 DOA: 09/07/2014  Referring physician: ED physician PCP: Angelica Chessman, MD  Specialists:   Chief Complaint: Worsening leg edema, cough and shortness breath.  HPI: [name] is a 53 y.o. female with PMH of with a history of chronic diastolic CHF, DM, medication non-compliance, tobacco abuse, alcohol abuse, COPD, hypothyroidism, paroxysmal AFIB on Xarelto, PAD with critical limb ischemia, h/o alcohol and h/o cocaine abuse presents  Patient reports that in the past 3 weeks she has not been feeling well. She feels like she gained a lot of fluid in her body. Her leg edema has been worsening. She also has cough with white colored sputum production. She feels hot, no chills. She did not measure her body temperature at home. She does not have chest pain. She does not have nausea, vomiting, abdominal pain, diarrhea. No pain over calf areas. No long distance travel history. No cold-like symptoms, such as sore throat or runny nose.  Patient is found to have elevated proBNP 1423, negative troponin, leukocytosis with WBC 16.9. CXR showed multiple focal pneumonia. She is admitted to inpatient for further evaluation treatment.  Review of Systems: As presented in the history of presenting illness, rest negative.  Where does patient live? Lives with her sister at home  Can patient participate in ADLs? Partially.  Allergy: No Known Allergies  Past Medical History  Diagnosis Date  . COPD (chronic obstructive pulmonary disease)   . Alcohol abuse   . Narcotic abuse   . Cocaine abuse   . Marijuana abuse   . Poorly controlled diabetes mellitus   . COPD (chronic obstructive pulmonary disease)   . Tobacco abuse   . Heart failure   . Alcoholic cirrhosis   . Cardiomyopathy   . Peripheral arterial disease   . Obesity   . CHF (congestive heart failure)   . Hypothyroidism   . GERD (gastroesophageal reflux disease)    . Headache(784.0)     "@ least once or twice/wk" (09/10/2013)  . Arthritis     "hands, arms, shoulders, legs, back" (09/10/2013)  . Atrial fibrillation   . Critical lower limb ischemia   . Peripheral arterial disease     Past Surgical History  Procedure Laterality Date  . Lower extremity angiogram Right 09/10/2013    turbo hawk directional arthrectomy of a diffusely diseased SFA/notes 09/10/2013   . Cardioversion  ~ 02/2013    "twice" (09/10/2013)    Social History:  reports that she has been smoking Cigarettes.  She has a 57 pack-year smoking history. She has never used smokeless tobacco. She reports that she drinks about .6 ounces of alcohol per week. She reports that she uses illicit drugs ("Crack" cocaine and Marijuana).  Family History:  Family History  Problem Relation Age of Onset  . Hypertension Mother   . Diabetes Mother   . Cancer Mother     breast, ovarian, colon  . Hypertension Father      Prior to Admission medications   Medication Sig Start Date End Date Taking? Authorizing Provider  albuterol (PROVENTIL HFA;VENTOLIN HFA) 108 (90 BASE) MCG/ACT inhaler Inhale 1-2 puffs into the lungs every 4 (four) hours as needed for wheezing or shortness of breath.   Yes Historical Provider, MD  aspirin EC 81 MG tablet Take 1 tablet (81 mg total) by mouth daily. 07/31/13  Yes Renella Cunas, MD  carvedilol (COREG) 6.25 MG tablet Take 1 tablet (6.25 mg total) by mouth 2 (  two) times daily with a meal. 06/18/14  Yes Shanker Kristeen Mans, MD  clopidogrel (PLAVIX) 75 MG tablet Take 1 tablet (75 mg total) by mouth daily with breakfast. 06/18/14  Yes Shanker Kristeen Mans, MD  furosemide (LASIX) 40 MG tablet Take 1 tablet (40 mg total) by mouth daily. 06/18/14  Yes Shanker Kristeen Mans, MD  gabapentin (NEURONTIN) 300 MG capsule Take 1 capsule (300 mg total) by mouth 2 (two) times daily. 06/18/14  Yes Shanker Kristeen Mans, MD  glipiZIDE (GLUCOTROL XL) 10 MG 24 hr tablet Take 30 mg by mouth daily with  breakfast.   Yes Historical Provider, MD  insulin glargine (LANTUS) 100 UNIT/ML injection Inject 35 Units into the skin at bedtime.   Yes Historical Provider, MD  insulin NPH-regular Human (NOVOLIN 70/30) (70-30) 100 UNIT/ML injection Inject 4-10 Units into the skin 3 (three) times daily. Per sliding scale.   Yes Historical Provider, MD  ipratropium-albuterol (DUONEB) 0.5-2.5 (3) MG/3ML SOLN Take 3 mLs by nebulization every 6 (six) hours as needed (for shortness of breath). 06/18/14  Yes Shanker Kristeen Mans, MD  levothyroxine (SYNTHROID, LEVOTHROID) 50 MCG tablet Take 1 tablet (50 mcg total) by mouth daily. 06/18/14  Yes Shanker Kristeen Mans, MD  lisinopril (PRINIVIL,ZESTRIL) 20 MG tablet Take 20 mg by mouth daily.   Yes Historical Provider, MD  metFORMIN (GLUCOPHAGE) 1000 MG tablet Take 1,000 mg by mouth 2 (two) times daily with a meal.   Yes Historical Provider, MD  oxyCODONE (OXY IR/ROXICODONE) 5 MG immediate release tablet Take 5 mg by mouth every 8 (eight) hours.   Yes Historical Provider, MD  rivaroxaban (XARELTO) 20 MG TABS tablet Take 1 tablet (20 mg total) by mouth every morning. 06/18/14  Yes Shanker Kristeen Mans, MD  ACCU-CHEK SOFTCLIX LANCETS lancets 1 each by Other route See admin instructions. Check blood sugar twice daily.    Historical Provider, MD  glucose blood (ACCU-CHEK AVIVA PLUS) test strip 1 each by Other route See admin instructions. Check blood sugar twice daily.    Historical Provider, MD    Physical Exam: Filed Vitals:   09/07/14 [id number] 09/07/14 0611 09/07/14 0615 09/07/14 0636  BP: 135/52  132/70   Pulse: 89  88   Temp:    98.7 F (37.1 C)  TempSrc:    Oral  Resp: 17  21   Height:      Weight:      SpO2: 94% 97% 100%    General: Not in acute distress HEENT:       Eyes: PERRL, EOMI, no scleral icterus       ENT: No discharge from the ears and nose, no pharynx injection, no tonsillar enlargement.        Neck: positive JVD, no bruit, no mass felt. Cardiac: S1/S2, RRR, No  murmurs, gallops or rubs Pulm: diffused rales posteriorly, with mild wheezing, Norubs. Abd: Soft, nondistended, nontender, no rebound pain, no organomegaly, BS present Ext: 2+ pitting leg edema bilaterally, 2+DP/PT pulse bilaterally Musculoskeletal: No joint deformities, erythema, or stiffness, ROM full Skin: No rashes.  Neuro: Alert and oriented X3, cranial nerves II-XII grossly intact, muscle strength 5/5 in all extremeties, sensation to light touch intact.  Psych: Patient is not psychotic, no suicidal or hemocidal ideation.  Labs on Admission:  Basic Metabolic Panel:  Recent Labs Lab 09/07/14 0436  NA 137  K 4.1  CL 97  CO2 26  GLUCOSE 235*  BUN 16  CREATININE 0.93  CALCIUM 8.8   Liver Function Tests:  Recent Labs Lab 09/07/14 0436  AST 13  ALT 12  ALKPHOS 145*  BILITOT <0.2*  PROT 7.1  ALBUMIN 2.5*   No results found for this basename: LIPASE, AMYLASE,  in the last 168 hours No results found for this basename: AMMONIA,  in the last 168 hours CBC:  Recent Labs Lab 09/07/14 0436  WBC 16.9*  NEUTROABS 13.2*  HGB 9.4*  HCT 29.4*  MCV 89.4  PLT 408*   Cardiac Enzymes: No results found for this basename: CKTOTAL, CKMB, CKMBINDEX, TROPONINI,  in the last 168 hours  BNP (last 3 results)  Recent Labs  06/06/14 1200 06/16/14 1050 09/07/14 0406  PROBNP 980.5* 2014.0* 1423.0*   CBG:  Recent Labs Lab 09/07/14 0401  GLUCAP 244*    Radiological Exams on Admission: Dg Chest 2 View  09/07/2014   CLINICAL DATA:  Weakness. Shortness of breath. Edema. High blood sugar.  EXAM: CHEST  2 VIEW  COMPARISON:  06/16/2014  FINDINGS: Shallow inspiration. Linear atelectasis or infiltration demonstrated in the left lung base and right mid lung. Changes may represent multifocal pneumonia. Changes are new since previous study. No blunting of costophrenic angles. No pneumothorax. Heart size and pulmonary vascularity are normal for technique.  IMPRESSION: Shallow inspiration  with developing opacities in the right mid lung and left lung base likely representing multifocal pneumonia.   Electronically Signed   By: Lucienne Capers M.D.   On: 09/07/2014 04:59    EKG: Independently reviewed.   Assessment/Plan Principal Problem:   HCAP (healthcare-associated pneumonia) Active Problems:   COPD (chronic obstructive pulmonary disease)   Atrial fibrillation   DM (diabetes mellitus), type 2 with peripheral vascular complications   Long term current use of anticoagulant therapy   Hypothyroidism   S/P peripheral artery angioplasty - TurboHawk atherectomy; R SFA   Acute on chronic diastolic CHF (congestive heart failure), NYHA class 3  SOB: It is likely due to multifactorial etiologies, including COPD exacerbation, HCAP (as evidenced on chest x-ray for multifocal infilration and leukocytosis), congestive heart failure exacerbation (elevated troponin and worsening leg edema). - will admit to tele bed given her A fib - treat HCAP:   treat with Vancomycin and cefepime to cover HCAP.    blood culture before antibiotics  urine legionella and S. pneumococcal antigen  follow up blood culture x 2  Check respiratory virus panel  repeat CBC next AM - treat CHF exacerbation: 2-D echo 09/04/13 showed EF 60% Switch oral lasix to IV, 40 mg daily Check BMP and Mg at 12:00 PM Continue home medications: Aspirin, Coreg, lisinopril Trop x 3 - treat COPD with nebulizers: Albuterol and Atrovent every 4 hours  DM-II: Last A1c was 8.13/22/15. Also metformin and glipizide at home. - continue lantus 35 unit daily - ssi - hold oral meds  A fib: Heart rate is well controlled. Heart rate is 92 on admission. She is on Xarelto at home, without bleeding tendency. -Continue Xarelto -continue coreg  PAD with limb ischemia: stable -continue plavix  Hypothyroidism: Last TSH was 11.96 on 07/26/13. Patient is on Synthroid at home. -Continue Synthroid, -Check TSH.  DVT ppx: SQ  Heparin  Code Status: Full code Family Communication:  Yes, patient's nephew at bed side Disposition Plan: Admit to inpatient   Date of Service 09/07/2014    Ivor Costa [facility] Pager [phone]  If 7PM-7AM, please contact night-coverage [email or web address] Password [facility] 09/07/2014, 6:39 AM

## 2014-09-07 NOTE — ED Notes (Signed)
Attempted report 

## 2014-09-07 NOTE — Progress Notes (Addendum)
ANTIBIOTIC CONSULT NOTE - INITIAL  Pharmacy Consult for Vancomycin Indication: pneumonia  No Known Allergies  Patient Measurements: Height: 5\' 4"  (162.6 cm) Weight: 229 lb 8 oz (104.1 kg) IBW/kg (Calculated) : 54.7  Vital Signs: Temp: 98.7 F (37.1 C) (10/25 0812) Temp Source: Oral (10/25 0812) BP: 112/58 mmHg (10/25 0812) Pulse Rate: 109 (10/25 0812) Intake/Output from previous day:   Intake/Output from this shift:    Labs:  Recent Labs  09/07/14 0436  WBC 16.9*  HGB 9.4*  PLT 408*  CREATININE 0.93   Estimated Creatinine Clearance: 84.2 ml/min (by C-G formula based on Cr of 0.93). No results found for this basename: VANCOTROUGH, VANCOPEAK, VANCORANDOM, GENTTROUGH, GENTPEAK, GENTRANDOM, TOBRATROUGH, TOBRAPEAK, TOBRARND, AMIKACINPEAK, AMIKACINTROU, AMIKACIN,  in the last 72 hours   Microbiology: No results found for this or any previous visit (from the past 720 hour(s)).  Medical History: Past Medical History  Diagnosis Date  . COPD (chronic obstructive pulmonary disease)   . Alcohol abuse   . Narcotic abuse   . Cocaine abuse   . Marijuana abuse   . Poorly controlled diabetes mellitus   . COPD (chronic obstructive pulmonary disease)   . Tobacco abuse   . Heart failure   . Alcoholic cirrhosis   . Cardiomyopathy   . Peripheral arterial disease   . Obesity   . CHF (congestive heart failure)   . Hypothyroidism   . GERD (gastroesophageal reflux disease)   . Headache(784.0)     "@ least once or twice/wk" (09/10/2013)  . Arthritis     "hands, arms, shoulders, legs, back" (09/10/2013)  . Atrial fibrillation   . Critical lower limb ischemia   . Peripheral arterial disease     Medications:  Scheduled:  . albuterol  2.5 mg Nebulization Q4H  . aspirin EC  81 mg Oral Daily  . carvedilol  6.25 mg Oral BID WC  . ceFEPime (MAXIPIME) IV  1 g Intravenous Q8H  . [START ON 09/08/2014] clopidogrel  75 mg Oral Q breakfast  . furosemide  60 mg Intravenous BID  .  gabapentin  300 mg Oral BID  . insulin aspart  0-9 Units Subcutaneous TID WC  . insulin glargine  35 Units Subcutaneous QHS  . ipratropium  0.5 mg Nebulization Q4H  . levothyroxine  50 mcg Oral QAC breakfast  . lisinopril  20 mg Oral Daily  . oxyCODONE  5 mg Oral 3 times per day  . rivaroxaban  20 mg Oral Q supper   Assessment: 53 yo f admitted on 10/25 for cough, SOB and worsening leg edema.  Chest xray significant for developing opacities in the right and left lung likely representing pneumonia.  Pharmacy is consulted to dose vancomycin.  Patient was given a loading dose of 1500 mg this morning at 0628.  Will give another load of 750 mg since patient weighs 104 kg (combined load is ~22 mg/kg) then will begin 1,000 mg IV q12hrs.  Wbc 16.9, temp 99.4, SCr 0.93, CrCl ~84 ml/min.   Cefepime 10/25 >> Vancomycin 10/25 >>  10/25 RSV panel:  10/25 Sputum Cx: 10/25 Bld x 2:  Goal of Therapy:  Vancomycin trough level 15-20 mcg/ml  Plan:  Vancomycin 750 mg IV load Vancomycin 1,000 mg IV q12hr VT at Css  Monitor CBC, temperature curve, renal function, cultures, clinical course  Cassie L. Nicole Kindred, PharmD Clinical Pharmacy Resident Pager: [phone] 09/07/2014 9:42 AM

## 2014-09-07 NOTE — Progress Notes (Signed)
Patient Demographics  [name], is a 53 y.o. female, DOB - 1960-12-06, [id number]  Admit date - 09/07/2014   Admitting Physician Ivor Costa, MD  Outpatient Primary MD for the patient is Angelica Chessman, MD  LOS - 0   Chief Complaint  Patient presents with  . Hyperglycemia  . Weakness  . Shortness of Breath  . Leg Swelling        Subjective:   [name] today has, No headache, No chest pain, No abdominal pain - No Nausea, No new weakness tingling or numbness, improved Cough and SOB.   Assessment & Plan    1. Acute hypoxic respiratory failure due to combination of HCAP and acute on chronic diastolic CHF. EF 60%. Continue empiric and the buttocks, follow cultures, nebulizer treatments and oxygen as needed. Place on salt and fluid restriction. IV Lasix for diuresis.    2. Acute on chronic diastolic CHF EF 123456. Place on salt and fluid restriction. IV Lasix for diuresis. Continue beta blocker, add nitro paste.   3. COPD. No wheezing on exam, no acute issues, supportive care, conseled to quit smoking.   4. 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 Continue Lantus and sliding scale. Monitor CBGs.   CBG (last 3)   Recent Labs  09/07/14 0401 09/07/14 0714 09/07/14 0847  GLUCAP 244* 174* 137*     Lab Results  Component Value Date   HGBA1C 8.1* 02/02/2014    5. History of paroxysmal atrial fibrillation. Currently in sinus, telemetry monitor, Coreg, continue xaralto.    6. Hypothyroidism. On home dose Synthroid. Repeat TSH.     Lab Results  Component Value Date   TSH 11.964* 07/26/2013     7. Smoking. Counseled the patient to quit smoking, nicotine patch    8. Severe PAD. On aspirin Plavix and Diabetes medications for second prevention.     Code  Status: Full  Family Communication: None present  Disposition Plan: Home   Procedures chest x-ray   Consults  none   Medications  Scheduled Meds: . albuterol  2.5 mg Nebulization Q4H  . aspirin EC  81 mg Oral Daily  . carvedilol  6.25 mg Oral BID WC  . ceFEPime (MAXIPIME) IV  1 g Intravenous Q8H  . [START ON 09/08/2014] clopidogrel  75 mg Oral Q breakfast  . furosemide  60 mg Intravenous BID  . gabapentin  300 mg Oral BID  . insulin aspart  0-9 Units Subcutaneous TID WC  . insulin glargine  35 Units Subcutaneous QHS  . ipratropium  0.5 mg Nebulization Q4H  . levothyroxine  50 mcg Oral QAC breakfast  . lisinopril  20 mg Oral Daily  . oxyCODONE  5 mg Oral 3 times per day  . rivaroxaban  20 mg Oral Q supper   Continuous Infusions:  PRN Meds:.albuterol  DVT Prophylaxis xaralto  Lab Results  Component Value Date   PLT 408* 09/07/2014    Antibiotics    Anti-infectives   Start     Dose/Rate Route Frequency Ordered Stop   09/07/14 1000  ceFEPIme (MAXIPIME) 1 g in dextrose 5 % 50 mL IVPB     1 g 100 mL/hr over 30 Minutes Intravenous Every 8 hours 09/07/14 0824 09/15/14 0959  09/07/14 0545  piperacillin-tazobactam (ZOSYN) IVPB 3.375 g     3.375 g 100 mL/hr over 30 Minutes Intravenous  Once 09/07/14 0539 09/07/14 0658   09/07/14 0545  vancomycin (VANCOCIN) 1,497 mg in sodium chloride 0.9 % 500 mL IVPB  Status:  Discontinued     15 mg/kg  99.8 kg 250 mL/hr over 120 Minutes Intravenous  Once 09/07/14 0539 09/07/14 0542   09/07/14 0545  vancomycin (VANCOCIN) 1,500 mg in sodium chloride 0.9 % 500 mL IVPB     1,500 mg 250 mL/hr over 120 Minutes Intravenous  Once 09/07/14 0542 09/07/14 0828          Objective:   Filed Vitals:   09/07/14 0630 09/07/14 0636 09/07/14 0812 09/07/14 0825  BP: 118/66  112/58   Pulse: 89  109   Temp:  98.7 F (37.1 C) 98.7 F (37.1 C)   TempSrc:  Oral Oral   Resp: 18  18   Height:   5\' 4"  (1.626 m)   Weight:   104.1 kg (229 lb 8  oz)   SpO2: 95%  97% 97%    Wt Readings from Last 3 Encounters:  09/07/14 104.1 kg (229 lb 8 oz)  06/17/14 97.5 kg (214 lb 15.2 oz)  03/13/14 92.942 kg (204 lb 14.4 oz)    No intake or output data in the 24 hours ending 09/07/14 0935   Physical Exam  Awake Alert, Oriented X 3, No new F.N deficits, Normal affect [place].AT,PERRAL Supple Neck,No JVD, No cervical lymphadenopathy appriciated.  Symmetrical Chest wall movement, Good air movement bilaterally, coarse bilateral breath sounds with some rales in the bases RRR,No Gallops,Rubs or new Murmurs, No Parasternal Heave +ve B.Sounds, Abd Soft, No tenderness, No organomegaly appriciated, No rebound - guarding or rigidity. No Cyanosis, Clubbing , 2+ leg edema, No new Rash or bruise      Data Review   Micro Results No results found for this or any previous visit (from the past 240 hour(s)).  Radiology Reports Dg Chest 2 View  09/07/2014   CLINICAL DATA:  Weakness. Shortness of breath. Edema. High blood sugar.  EXAM: CHEST  2 VIEW  COMPARISON:  06/16/2014  FINDINGS: Shallow inspiration. Linear atelectasis or infiltration demonstrated in the left lung base and right mid lung. Changes may represent multifocal pneumonia. Changes are new since previous study. No blunting of costophrenic angles. No pneumothorax. Heart size and pulmonary vascularity are normal for technique.  IMPRESSION: Shallow inspiration with developing opacities in the right mid lung and left lung base likely representing multifocal pneumonia.   Electronically Signed   By: Lucienne Capers M.D.   On: 09/07/2014 04:59        CBC  Recent Labs Lab 09/07/14 0436  WBC 16.9*  HGB 9.4*  HCT 29.4*  PLT 408*  MCV 89.4  MCH 28.6  MCHC 32.0  RDW 13.5  LYMPHSABS 2.3  MONOABS 1.1*  EOSABS 0.3  BASOSABS 0.0    Chemistries   Recent Labs Lab 09/07/14 0436  NA 137  K 4.1  CL 97  CO2 26  GLUCOSE 235*  BUN 16  CREATININE 0.93  CALCIUM 8.8  AST 13  ALT 12    ALKPHOS 145*  BILITOT <0.2*   ------------------------------------------------------------------------------------------------------------------ estimated creatinine clearance is 84.2 ml/min (by C-G formula based on Cr of 0.93). ------------------------------------------------------------------------------------------------------------------ No results found for this basename: HGBA1C,  in the last 72 hours ------------------------------------------------------------------------------------------------------------------ No results found for this basename: CHOL, HDL, LDLCALC, TRIG, CHOLHDL, LDLDIRECT,  in the  last 72 hours ------------------------------------------------------------------------------------------------------------------ No results found for this basename: TSH, T4TOTAL, FREET3, T3FREE, THYROIDAB,  in the last 72 hours ------------------------------------------------------------------------------------------------------------------ No results found for this basename: VITAMINB12, FOLATE, FERRITIN, TIBC, IRON, RETICCTPCT,  in the last 72 hours  Coagulation profile  Recent Labs Lab 09/07/14 0436  INR 2.04*    No results found for this basename: DDIMER,  in the last 72 hours  Cardiac Enzymes No results found for this basename: CK, CKMB, TROPONINI, MYOGLOBIN,  in the last 168 hours ------------------------------------------------------------------------------------------------------------------ No components found with this basename: POCBNP,      Time Spent in minutes   35   [name] K M.D on 09/07/2014 at 9:35 AM  Between 7am to 7pm - Pager - [phone]  After 7pm go to [email or web address] - password [email or web address]  And look for the night coverage person covering for me after hours  [facility] Office  [phone]

## 2014-09-07 NOTE — ED Notes (Signed)
Report attempted 

## 2014-09-07 NOTE — ED Notes (Addendum)
C/o weak, sob, edema and high blood sugar. Ongoing sx for 2-3 weeks. Here with family. Alert, NAD, calm, interactive. Reports abd and foot pain 10/10. (denies: dizziness).

## 2014-09-08 LAB — MAGNESIUM: MAGNESIUM: 1.9 mg/dL (ref 1.5–2.5)

## 2014-09-08 LAB — LEGIONELLA ANTIGEN, URINE

## 2014-09-08 LAB — CBC
HCT: 29.4 % — ABNORMAL LOW (ref 36.0–46.0)
Hemoglobin: 9.4 g/dL — ABNORMAL LOW (ref 12.0–15.0)
MCH: 28.6 pg (ref 26.0–34.0)
MCHC: 32 g/dL (ref 30.0–36.0)
MCV: 89.4 fL (ref 78.0–100.0)
PLATELETS: 382 10*3/uL (ref 150–400)
RBC: 3.29 MIL/uL — ABNORMAL LOW (ref 3.87–5.11)
RDW: 13.8 % (ref 11.5–15.5)
WBC: 16.1 10*3/uL — ABNORMAL HIGH (ref 4.0–10.5)

## 2014-09-08 LAB — GLUCOSE, CAPILLARY
GLUCOSE-CAPILLARY: 168 mg/dL — AB (ref 70–99)
Glucose-Capillary: 185 mg/dL — ABNORMAL HIGH (ref 70–99)
Glucose-Capillary: 455 mg/dL — ABNORMAL HIGH (ref 70–99)
Glucose-Capillary: 163 mg/dL — ABNORMAL HIGH (ref 70–99)

## 2014-09-08 LAB — RESPIRATORY VIRUS PANEL
ADENOVIRUS: NOT DETECTED
INFLUENZA A: NOT DETECTED
Influenza A H1: NOT DETECTED
Influenza A H3: NOT DETECTED
Influenza B: NOT DETECTED
METAPNEUMOVIRUS: NOT DETECTED
PARAINFLUENZA 2 A: NOT DETECTED
PARAINFLUENZA 3 A: NOT DETECTED
Parainfluenza 1: NOT DETECTED
Respiratory Syncytial Virus A: NOT DETECTED
Respiratory Syncytial Virus B: NOT DETECTED
Rhinovirus: NOT DETECTED

## 2014-09-08 LAB — BASIC METABOLIC PANEL
ANION GAP: 15 (ref 5–15)
BUN: 20 mg/dL (ref 6–23)
CHLORIDE: 96 meq/L (ref 96–112)
CO2: 23 mEq/L (ref 19–32)
Calcium: 8.3 mg/dL — ABNORMAL LOW (ref 8.4–10.5)
Creatinine, Ser: 0.93 mg/dL (ref 0.50–1.10)
GFR calc non Af Amer: 70 mL/min — ABNORMAL LOW (ref >90)
GFR, EST AFRICAN AMERICAN: 81 mL/min — AB (ref >90)
Glucose, Bld: 191 mg/dL — ABNORMAL HIGH (ref 70–99)
Potassium: 4.5 mEq/L (ref 3.7–5.3)
Sodium: 134 mEq/L — ABNORMAL LOW (ref 137–147)

## 2014-09-08 MED ORDER — INSULIN GLARGINE 100 UNIT/ML ~~LOC~~ SOLN
25.0000 [IU] | Freq: Two times a day (BID) | SUBCUTANEOUS | Status: DC
  Administered 2014-09-08 – 2014-09-09 (×3): 25 [IU] via SUBCUTANEOUS
  Filled 2014-09-08 (×5): qty 0.25

## 2014-09-08 MED ORDER — INSULIN GLARGINE 100 UNIT/ML ~~LOC~~ SOLN
25.0000 [IU] | Freq: Two times a day (BID) | SUBCUTANEOUS | Status: DC
  Filled 2014-09-08: qty 0.25

## 2014-09-08 MED ORDER — LISINOPRIL 10 MG PO TABS
10.0000 mg | ORAL_TABLET | Freq: Every day | ORAL | Status: DC
  Administered 2014-09-08 – 2014-09-09 (×2): 10 mg via ORAL
  Filled 2014-09-08 (×2): qty 1

## 2014-09-08 MED ORDER — ZOLPIDEM TARTRATE 5 MG PO TABS
5.0000 mg | ORAL_TABLET | Freq: Once | ORAL | Status: AC
  Administered 2014-09-08: 5 mg via ORAL
  Filled 2014-09-08: qty 1

## 2014-09-08 MED ORDER — INSULIN ASPART 100 UNIT/ML ~~LOC~~ SOLN
15.0000 [IU] | Freq: Once | SUBCUTANEOUS | Status: AC
  Administered 2014-09-08: 15 [IU] via SUBCUTANEOUS

## 2014-09-08 MED ORDER — INSULIN ASPART 100 UNIT/ML ~~LOC~~ SOLN
0.0000 [IU] | Freq: Three times a day (TID) | SUBCUTANEOUS | Status: DC
  Administered 2014-09-08: 3 [IU] via SUBCUTANEOUS
  Administered 2014-09-09: 2 [IU] via SUBCUTANEOUS

## 2014-09-08 MED ORDER — INSULIN ASPART 100 UNIT/ML ~~LOC~~ SOLN: 0.0000 [IU] | Freq: Every day | SUBCUTANEOUS | Status: DC

## 2014-09-08 MED ORDER — INSULIN ASPART 100 UNIT/ML ~~LOC~~ SOLN
4.0000 [IU] | Freq: Three times a day (TID) | SUBCUTANEOUS | Status: DC
  Administered 2014-09-08 (×2): 4 [IU] via SUBCUTANEOUS

## 2014-09-08 MED ORDER — CARVEDILOL 12.5 MG PO TABS
12.5000 mg | ORAL_TABLET | Freq: Two times a day (BID) | ORAL | Status: DC
  Administered 2014-09-08 – 2014-09-09 (×2): 12.5 mg via ORAL
  Filled 2014-09-08 (×4): qty 1

## 2014-09-08 NOTE — Progress Notes (Signed)
Inpatient Diabetes Program Recommendations  AACE/ADA: New Consensus Statement on Inpatient Glycemic Control (2013)  Target Ranges:  Prepandial:   less than 140 mg/dL      Peak postprandial:   less than 180 mg/dL (1-2 hours)      Critically ill patients:  140 - 180 mg/dL   Reason for Assessment: Hyperglycemia  Diabetes history: Type 2 Outpatient Diabetes medications: Per med rec- Glucotrol XL, 30 mg daily, Glucophage 1000 bid, Lantus 35 at HS, 70/30 4 to 10 units tid per sliding scale Current orders for Inpatient glycemic control: Lantus 35 units at HS, Novolog sensitive correction tid with meals, Heart healthy diet order  Results for Karla, Price (MRN [id number]) as of 09/08/2014 09:49  Ref. Range 09/07/2014 08:47 09/07/2014 11:23 09/07/2014 16:32 09/07/2014 20:48 09/08/2014 05:51  Glucose-Capillary Latest Range: 70-99 mg/dL 137 (H) 273 (H) 266 (H) 316 (H) 185 (H)   Note:  CBG pattern indicative of need for meal coverage insulin and CHO control.   Recommendations:  Add Novolog 7 units as meal coverage tid with meals in addition to correction provided patient eats at least 50% and CBG at least 80 mg/dl  Add CHO Medium modification to diet order. Thank you.  [name] S. Marcelline Mates, RN, CNS, CDE Inpatient Diabetes Program, team pager [phone]

## 2014-09-08 NOTE — Evaluation (Addendum)
Occupational Therapy Evaluation Patient Details Name: NAVADA LITTLER MRN: [id number] DOB: 1961-08-20 Today's Date: 09/08/2014    History of Present Illness Worsening leg edema, cough and shortness breath--found to have HCAP. PMHx:CHF, DM, tobacco/ETOH/cocaine/marijuana abuse, COPD, A-fib, PAD   Clinical Impression   This 53 yo female admitted with above presents to acute OT with increased pain in Bil LEs--calfs (left>right) with activity and decreased ability/increased work for [employer]. She will benefit from acute OT without need for follow up.    Follow Up Recommendations  No OT follow up    Equipment Recommendations  3 in 1 bedside comode (RW)       Precautions / Restrictions Precautions Precautions: Fall Restrictions Weight Bearing Restrictions: No      Mobility Bed Mobility               General bed mobility comments: Pt up in recliner upon arrival  Transfers Overall transfer level: Needs assistance Equipment used: Rolling walker (2 wheeled) Transfers: Sit to/from Stand Sit to Stand: Supervision         General transfer comment: Ambulated with S 200 feet with RW (2 rest breaks due to legs hurting her; left>right)         ADL Overall ADL's : Needs assistance/impaired Eating/Feeding: Independent;Sitting   Grooming: Set up;Supervision/safety;Standing   Upper Body Bathing: Set up;Sitting   Lower Body Bathing: Moderate assistance (with S sit<>stand)   Upper Body Dressing : Set up;Sitting   Lower Body Dressing: Moderate assistance (with S sit<>stand)   Toilet Transfer: Supervision/safety;Ambulation;RW (recliner>down to nursing station and back to recliner)   [place] and Hygiene: Supervision/safety (with S sit <>stand)                         Pertinent Vitals/Pain Pain Assessment: 0-10 Pain Score: 6  Pain Location: LLE-calf post ambulation Pain Descriptors / Indicators: Aching;Spasm Pain Intervention(s):  Monitored during session;Repositioned (massage)     Hand Dominance Right   Extremity/Trunk Assessment Upper Extremity Assessment Upper Extremity Assessment: Overall WFL for tasks assessed   Lower Extremity Assessment Lower Extremity Assessment: Defer to PT evaluation       Communication Communication Communication: No difficulties   Cognition Arousal/Alertness: Awake/alert Behavior During Therapy: WFL for tasks assessed/performed Overall Cognitive Status: Within Functional Limits for tasks assessed                                Home Living Family/patient expects to be discharged to:: Private residence Living Arrangements: Other relatives Available Help at Discharge: Available 24 hours/day;Family Type of Home: Apartment Home Access: Stairs to enter Entrance Stairs-Number of Steps: 12 Entrance Stairs-Rails: Right Home Layout: One level     Bathroom Shower/Tub: Tub/shower unit;Curtain Shower/tub characteristics: Architectural technologist: Standard     Home Equipment: Bedside commode;Shower seat (says she has a SPC, but can't find it)          Prior Functioning/Environment Level of Independence: Independent             OT Diagnosis: Generalized weakness;Acute pain   OT Problem List: Decreased activity tolerance;Pain;Obesity;Decreased knowledge of use of DME or AE   OT Treatment/Interventions: Self-care/ADL training;Patient/family education;DME and/or AE instruction    OT Goals(Current goals can be found in the care plan section) Acute Rehab OT Goals Patient Stated Goal: home tomorrow OT Goal Formulation: With patient Time For Goal Achievement: 09/15/14 Potential to Achieve Goals: Good  ADL Goals Pt Will Perform Lower Body Bathing: with supervision;with adaptive equipment;sit to/from stand Pt Will Perform Lower Body Dressing: with supervision;with adaptive equipment;sit to/from stand  OT Frequency: Min 2X/week              End of Session  Equipment Utilized During Treatment: Rolling walker  Activity Tolerance: Patient limited by pain Patient left: in chair;with call bell/phone within reach   Time: 0843-0907 OT Time Calculation (min): 24 min Charges:  OT General Charges $OT Visit: 1 Procedure OT Evaluation $Initial OT Evaluation Tier I: 1 Procedure OT Treatments $Self Care/Home Management : 8-22 mins  Almon Register [id number] 09/08/2014, 9:24 AM

## 2014-09-08 NOTE — Progress Notes (Signed)
UR completed Tameka Hoiland K. Jailani Hogans, RN, BSN, MSHL, CCM  09/08/2014 10:46 AM

## 2014-09-08 NOTE — Progress Notes (Signed)
Report given to receiving RN. Patient in bed resting. No verbal complaints and no signs or symptoms of distress or discomfort noted.

## 2014-09-08 NOTE — Evaluation (Signed)
Physical Therapy Evaluation Patient Details Name: [name] MRN: [id number] DOB: May 30, 1961 Today's Date: 09/08/2014   History of Present Illness  53 yo female with history of HCAP with lower extremity edema, cough, SOB.  Hx:  CHF, DM, polysubstance abuse and COPD, a-fib  Clinical Impression  Pt was seen for initial visit but BS was extremely high, at 455.  Had nursing in to start IV but still had her insulin just at end of therapy.  Her plan is to have HHPT come out to see and continue her rehab as she is going to need strengthening.  Pt in agreement.    Follow Up Recommendations Home health PT;Supervision/Assistance - 24 hour    Equipment Recommendations  None recommended by PT    Recommendations for Other Services       Precautions / Restrictions Precautions Precautions: Fall Restrictions Weight Bearing Restrictions: No      Mobility  Bed Mobility                  Transfers Overall transfer level: Needs assistance Equipment used: Rolling walker (2 wheeled) Transfers: Sit to/from Stand Sit to Stand: Supervision         General transfer comment: unable to maneuver due to BS being 455  Ambulation/Gait                Stairs            Wheelchair Mobility    Modified Rankin (Stroke Patients Only)       Balance Overall balance assessment: Needs assistance Sitting-balance support: Feet supported Sitting balance-Leahy Scale: Good   Postural control: Posterior lean Standing balance support: Bilateral upper extremity supported Standing balance-Leahy Scale: Fair                               Pertinent Vitals/Pain Pain Assessment: 0-10 Pain Score: 6  Pain Location: LLE calf at rest Pain Intervention(s): RN gave pain meds during session;Limited activity within patient's tolerance;Monitored during session;Other (comment) (stretch)    Home Living Family/patient expects to be discharged to:: Private residence Living  Arrangements: Other relatives Available Help at Discharge: Available 24 hours/day;Family Type of Home: Apartment Home Access: Stairs to enter Entrance Stairs-Rails: Right Entrance Stairs-Number of Steps: [place]: One level Home Equipment: Bedside commode;Shower seat      Prior Function Level of Independence: Independent               Hand Dominance   Dominant Hand: Right    Extremity/Trunk Assessment   Upper Extremity Assessment: Overall WFL for tasks assessed           Lower Extremity Assessment: Generalized weakness      Cervical / Trunk Assessment: Normal  Communication   Communication: No difficulties  Cognition Arousal/Alertness: Awake/alert Behavior During Therapy: WFL for tasks assessed/performed Overall Cognitive Status: Within Functional Limits for tasks assessed                      General Comments      Exercises General Exercises - Lower Extremity Ankle Circles/Pumps: AROM;Both;10 reps Long Arc Quad: AROM;Both;10 reps Heel Slides: AROM;Both;10 reps Hip ABduction/ADduction: Strengthening;Both;10 reps      Assessment/Plan    PT Assessment Patient needs continued PT services  PT Diagnosis Generalized weakness   PT Problem List Decreased strength;Decreased range of motion;Decreased activity tolerance;Decreased balance;Decreased mobility;Decreased coordination;Decreased cognition;Obesity;Pain  PT Treatment Interventions DME instruction;Gait training;Stair training;Functional  mobility training;Therapeutic activities;Therapeutic exercise;Balance training;Neuromuscular re-education;Patient/family education   PT Goals (Current goals can be found in the Care Plan section) Acute Rehab PT Goals Patient Stated Goal: to get home PT Goal Formulation: With patient Time For Goal Achievement: 09/19/14 Potential to Achieve Goals: Good    Frequency Min 2X/week   Barriers to discharge Inaccessible home environment 12 stairs and needs to  try but cannot with elevated BS    Co-evaluation               End of Session   Activity Tolerance: Treatment limited secondary to medical complications (Comment);Other (comment) (elevated BS) Patient left: in chair;with call bell/phone within reach;with nursing/sitter in room Nurse Communication: Other (comment) (limiting visit due to [facility])         Time: [id number] PT Time Calculation (min): 17 min   Charges:   PT Evaluation $Initial PT Evaluation Tier I: 1 Procedure     PT G CodesRamond Dial 09/28/14, 1:29 PM Mee Hives, PT MS Acute Rehab Dept. Number: [id number]

## 2014-09-08 NOTE — Progress Notes (Signed)
Patient Demographics  [name], is a 53 y.o. female, DOB - 1961-01-12, [id number]  Admit date - 09/07/2014   Admitting Physician Karla Costa, MD  Outpatient Primary MD for the patient is Karla Chessman, MD  LOS - 1   Chief Complaint  Patient presents with  . Hyperglycemia  . Weakness  . Shortness of Breath  . Leg Swelling        Subjective:   [name] today has, No headache, No chest pain, No abdominal pain - No Nausea, No new weakness tingling or numbness, improved ++ Cough and SOB.   Assessment & Plan    1. Acute hypoxic respiratory failure due to combination of HCAP and acute on chronic diastolic CHF. EF 60%. Continue empiric ABX, follow cultures, nebulizer treatments and oxygen as needed. Placed on salt and fluid restriction. IV Lasix for diuresis. Much improved.    2. Acute on chronic diastolic CHF EF 123456. Place on salt and fluid restriction. IV Lasix for diuresis. Continue beta blocker, added nitro paste.    3. COPD. No wheezing on exam, no acute issues, supportive care, conseled to quit smoking.    4. 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 Continue Lantus and sliding scale. Monitor CBGs.   CBG (last 3)   Recent Labs  09/07/14 1632 09/07/14 2048 09/08/14 0551  GLUCAP 266* 316* 185*     Lab Results  Component Value Date   HGBA1C 8.1* 02/02/2014     5. History of paroxysmal atrial fibrillation. Currently in sinus, telemetry monitor, Coreg, continue xaralto.     6. Hypothyroidism. On home dose Synthroid. Repeated TSH.     Lab Results  Component Value Date   TSH 1.340 09/07/2014      7. Smoking. Counseled the patient to quit smoking, nicotine patch     8. Severe PAD. On aspirin Plavix and Diabetes medications for second  prevention.      Code Status: Full  Family Communication: None present  Disposition Plan: Home   Procedures chest x-ray   Consults  none   Medications  Scheduled Meds: . albuterol  2.5 mg Nebulization Q4H  . aspirin EC  81 mg Oral Daily  . carvedilol  6.25 mg Oral BID WC  . ceFEPime (MAXIPIME) IV  2 g Intravenous Q12H  . clopidogrel  75 mg Oral Q breakfast  . furosemide  60 mg Intravenous BID  . gabapentin  300 mg Oral BID  . insulin aspart  0-9 Units Subcutaneous TID WC  . insulin glargine  35 Units Subcutaneous QHS  . ipratropium  0.5 mg Nebulization Q4H  . levothyroxine  50 mcg Oral QAC breakfast  . lisinopril  20 mg Oral Daily  . nicotine  21 mg Transdermal Daily  . nitroGLYCERIN  0.5 inch Topical 4 times per day  . oxyCODONE  5 mg Oral 3 times per day  . rivaroxaban  20 mg Oral Q supper  . vancomycin  1,000 mg Intravenous Q12H   Continuous Infusions:  PRN Meds:.albuterol, ondansetron (ZOFRAN) IV  DVT Prophylaxis xaralto  Lab Results  Component Value Date   PLT 382 09/08/2014    Antibiotics    Anti-infectives   Start     Dose/Rate Route Frequency Ordered Stop  09/07/14 2200  vancomycin (VANCOCIN) IVPB 1000 mg/200 mL premix     1,000 mg 200 mL/hr over 60 Minutes Intravenous Every 12 hours 09/07/14 0945 09/14/14 2359   09/07/14 2200  ceFEPIme (MAXIPIME) 2 g in dextrose 5 % 50 mL IVPB     2 g 100 mL/hr over 30 Minutes Intravenous Every 12 hours 09/07/14 1056 09/15/14 0959   09/07/14 1000  ceFEPIme (MAXIPIME) 1 g in dextrose 5 % 50 mL IVPB  Status:  Discontinued     1 g 100 mL/hr over 30 Minutes Intravenous Every 8 hours 09/07/14 0824 09/07/14 1056   09/07/14 1000  vancomycin (VANCOCIN) IVPB 750 mg/150 ml premix     750 mg 150 mL/hr over 60 Minutes Intravenous  Once 09/07/14 0945 09/07/14 1401   09/07/14 0545  piperacillin-tazobactam (ZOSYN) IVPB 3.375 g     3.375 g 100 mL/hr over 30 Minutes Intravenous  Once 09/07/14 0539 09/07/14 0658   09/07/14  0545  vancomycin (VANCOCIN) 1,497 mg in sodium chloride 0.9 % 500 mL IVPB  Status:  Discontinued     15 mg/kg  99.8 kg 250 mL/hr over 120 Minutes Intravenous  Once 09/07/14 0539 09/07/14 0542   09/07/14 0545  vancomycin (VANCOCIN) 1,500 mg in sodium chloride 0.9 % 500 mL IVPB     1,500 mg 250 mL/hr over 120 Minutes Intravenous  Once 09/07/14 0542 09/07/14 0828          Objective:   Filed Vitals:   09/08/14 0113 09/08/14 0447 09/08/14 0503 09/08/14 0745  BP:  140/67    Pulse:  100    Temp:  98.3 F (36.8 C)    TempSrc:  Oral    Resp:  18    Height:      Weight:  103.7 kg (228 lb 9.9 oz)    SpO2: 97% 94% 95% 96%    Wt Readings from Last 3 Encounters:  09/08/14 103.7 kg (228 lb 9.9 oz)  06/17/14 97.5 kg (214 lb 15.2 oz)  03/13/14 92.942 kg (204 lb 14.4 oz)     Intake/Output Summary (Last 24 hours) at 09/08/14 0944 Last data filed at 09/08/14 0827  Gross per 24 hour  Intake   1280 ml  Output   3450 ml  Net  -2170 ml     Physical Exam  Awake Alert, Oriented X 3, No new F.N deficits, Normal affect [place].AT,PERRAL Supple Neck,No JVD, No cervical lymphadenopathy appriciated.  Symmetrical Chest wall movement, Good air movement bilaterally, clear bilateral breath sounds with no rales in the bases RRR,No Gallops,Rubs or new Murmurs, No Parasternal Heave +ve B.Sounds, Abd Soft, No tenderness, No organomegaly appriciated, No rebound - guarding or rigidity. No Cyanosis, Clubbing , trace leg edema, No new Rash or bruise      Data Review   Micro Results No results found for this or any previous visit (from the past 240 hour(s)).  Radiology Reports Dg Chest 2 View  09/07/2014   CLINICAL DATA:  Weakness. Shortness of breath. Edema. High blood sugar.  EXAM: CHEST  2 VIEW  COMPARISON:  06/16/2014  FINDINGS: Shallow inspiration. Linear atelectasis or infiltration demonstrated in the left lung base and right mid lung. Changes may represent multifocal pneumonia. Changes are new  since previous study. No blunting of costophrenic angles. No pneumothorax. Heart size and pulmonary vascularity are normal for technique.  IMPRESSION: Shallow inspiration with developing opacities in the right mid lung and left lung base likely representing multifocal pneumonia.   Electronically Signed  By: Lucienne Capers M.D.   On: 09/07/2014 04:59        CBC  Recent Labs Lab 09/07/14 0436 09/08/14 0320  WBC 16.9* 16.1*  HGB 9.4* 9.4*  HCT 29.4* 29.4*  PLT 408* 382  MCV 89.4 89.4  MCH 28.6 28.6  MCHC 32.0 32.0  RDW 13.5 13.8  LYMPHSABS 2.3  --   MONOABS 1.1*  --   EOSABS 0.3  --   BASOSABS 0.0  --     Chemistries   Recent Labs Lab 09/07/14 0436 09/07/14 0930 09/08/14 0320  NA 137 142 134*  K 4.1 4.3 4.5  CL 97 101 96  CO2 26 27 23   GLUCOSE 235* 123* 191*  BUN 16 14 20   CREATININE 0.93 0.90 0.93  CALCIUM 8.8 8.7 8.3*  MG  --  2.2 1.9  AST 13  --   --   ALT 12  --   --   ALKPHOS 145*  --   --   BILITOT <0.2*  --   --    ------------------------------------------------------------------------------------------------------------------ estimated creatinine clearance is 83.9 ml/min (by C-G formula based on Cr of 0.93). ------------------------------------------------------------------------------------------------------------------ No results found for this basename: HGBA1C,  in the last 72 hours ------------------------------------------------------------------------------------------------------------------ No results found for this basename: CHOL, HDL, LDLCALC, TRIG, CHOLHDL, LDLDIRECT,  in the last 72 hours ------------------------------------------------------------------------------------------------------------------  Recent Labs  09/07/14 0930  TSH 1.340   ------------------------------------------------------------------------------------------------------------------ No results found for this basename: VITAMINB12, FOLATE, FERRITIN, TIBC, IRON,  RETICCTPCT,  in the last 72 hours  Coagulation profile  Recent Labs Lab 09/07/14 0436  INR 2.04*    No results found for this basename: DDIMER,  in the last 72 hours  Cardiac Enzymes  Recent Labs Lab 09/07/14 0930 09/07/14 1445 09/07/14 2038  TROPONINI <0.30 <0.30 <0.30   ------------------------------------------------------------------------------------------------------------------ No components found with this basename: POCBNP,      Time Spent in minutes   35   Helia Haese K M.D on 09/08/2014 at 9:44 AM  Between 7am to 7pm - Pager - [phone]  After 7pm go to [email or web address] - password [email or web address]  And look for the night coverage person covering for me after hours  [facility] Office  [phone]

## 2014-09-08 NOTE — Care Management Note (Addendum)
  Page 2 of 2   09/09/2014     10:49:14 AM CARE MANAGEMENT NOTE 09/09/2014  Patient:  [name], [name]   Account Number:  [id number]  Date Initiated:  09/08/2014  Documentation initiated by:  Mohsen Odenthal  Subjective/Objective Assessment:   HCAP, COPD     Action/Plan:   CM to follow for disposition needs   Anticipated DC Date:  09/10/2014   Anticipated DC Plan:  HOME/SELF CARE  In-house referral  NA      DC Planning Services  CM consult      PAC Choice  DURABLE MEDICAL EQUIPMENT   Choice offered to / List presented to:  C-1 Patient   DME arranged  Vassie Moselle      DME agency  [place].        Status of service:  Completed, signed off Medicare Important Message given?   (If response is "NO", the following Medicare IM given date fields will be blank) Date Medicare IM given:   Medicare IM given by:   Date Additional Medicare IM given:   Additional Medicare IM given by:    Discharge Disposition:  HOME/SELF CARE  Per UR Regulation:  Reviewed for med. necessity/level of care/duration of stay  If discussed at [place] of Stay Meetings, dates discussed:    Comments:  Renardo Cheatum RN, BSN, MSHL, CCM  Nurse - Case Manager,  ([facility] [place])  [phone]  09/08/2014 PT RECS:  PT - Patient has MCD / no coverage for PT services CM encouraged patient to continue with self therapy at home based on PT instructions during this admission in order to gain strength back Patient requested RW; MD notified and ordered. PCP:  Dr. Janifer Adie - Last appt last Monday. Dispo Plan:  Home / Self care with MD f/u.  Aureliano Oshields RN, BSN, MSHL, CCM  Nurse - Case Manager,  ([phone]  09/08/2014 HYx/o 2 admissions and 2 ER visits over past 6 months. HCAP, COPD, Smoker 2 ppd Social:  53yo From home with sister. PT RECS:  Pending. Dispo Plan:  Home / Self Care. CM will continue to follow for needs as indicated.

## 2014-09-09 LAB — GLUCOSE, CAPILLARY
GLUCOSE-CAPILLARY: 274 mg/dL — AB (ref 70–99)
Glucose-Capillary: 146 mg/dL — ABNORMAL HIGH (ref 70–99)

## 2014-09-09 MED ORDER — INSULIN ASPART 100 UNIT/ML ~~LOC~~ SOLN
4.0000 [IU] | Freq: Three times a day (TID) | SUBCUTANEOUS | Status: DC
  Administered 2014-09-09: 4 [IU] via SUBCUTANEOUS

## 2014-09-09 MED ORDER — IPRATROPIUM-ALBUTEROL 0.5-2.5 (3) MG/3ML IN SOLN: 3.0000 mL | Freq: Four times a day (QID) | RESPIRATORY_TRACT | Status: DC | PRN

## 2014-09-09 MED ORDER — LEVOFLOXACIN 750 MG PO TABS: 750.0000 mg | ORAL_TABLET | Freq: Every day | ORAL | Status: DC

## 2014-09-09 MED ORDER — ALBUTEROL SULFATE (2.5 MG/3ML) 0.083% IN NEBU: 2.5000 mg | INHALATION_SOLUTION | Freq: Four times a day (QID) | RESPIRATORY_TRACT | Status: DC | PRN

## 2014-09-09 MED ORDER — ISOSORBIDE MONONITRATE ER 30 MG PO TB24: 30.0000 mg | ORAL_TABLET | Freq: Every day | ORAL | Status: DC

## 2014-09-09 MED ORDER — ALBUTEROL SULFATE HFA 108 (90 BASE) MCG/ACT IN AERS: 1.0000 | INHALATION_SPRAY | RESPIRATORY_TRACT | Status: DC | PRN

## 2014-09-09 MED ORDER — FUROSEMIDE 40 MG PO TABS: 40.0000 mg | ORAL_TABLET | Freq: Two times a day (BID) | ORAL | Status: DC

## 2014-09-09 NOTE — Progress Notes (Signed)
Occupational Therapy Treatment Patient Details Name: [name] MRN: [id number] DOB: [birth year] Today's Date: 09/09/2014    History of present illness 53 yo female with history of HCAP with lower extremity edema, cough, SOB.  Hx:  CHF, DM, polysubstance abuse and COPD, a-fib   OT comments  Gave pt AE from supply and practiced with equipment. Education provided. Pt plans to d/c today.  Follow Up Recommendations  No OT follow up    Equipment Recommendations  3 in 1 bedside comode;Other (comment) (RW)    Recommendations for Other Services      Precautions / Restrictions Precautions Precautions: Fall Restrictions Weight Bearing Restrictions: No       Mobility Bed Mobility               General bed mobility comments: not assessed  Transfers Overall transfer level: Needs assistance Equipment used: Rolling walker (2 wheeled) Transfers: Sit to/from Stand Sit to Stand: Supervision         General transfer comment: cues to reinforce technique    Balance                                   ADL Overall ADL's : Needs assistance/impaired     Grooming: Wash/dry hands;Standing;Supervision/safety               Lower Body Dressing: Supervision/safety;Set up;With adaptive equipment;Sit to/from stand   Toilet Transfer: Supervision/safety/Min guard;Ambulation;RW;Regular Toilet;Grab bars Toilet Transfer Details (indicate cue type and reason): cues for walker management Toileting- Clothing Manipulation and Hygiene: Supervision/safety;Sit to/from stand       Functional mobility during ADLs: Supervision/safety/Min guard;Rolling walker General ADL Comments: Educated on AE and pt practiced with it. Pt verbalized difficulty with toilet hygiene so educated on toilet aide and gave pt AE from supply. Educated on energy conservation techniques and educated on safety (use of bag on walker, sitting for most of LB ADLs).      Vision                      Perception     Praxis      Cognition  Awake/Alert Behavior During Therapy: WFL for tasks assessed/performed Overall Cognitive Status: No family/caregiver present to determine baseline cognitive functioning (decreased safety awareness)                       Extremity/Trunk Assessment               Exercises     Shoulder Instructions       General Comments      Pertinent Vitals/ Pain       Pain Assessment: 0-10 Pain Score: 8  Pain Location: feet Pain Descriptors / Indicators: Burning;Tingling;Other (Comment) (stinging) Pain Intervention(s): Monitored during session  Home Living                                          Prior Functioning/Environment              Frequency Min 2X/week     Progress Toward Goals  OT Goals(current goals can now be found in the care plan section)  Progress towards OT goals: Progressing toward goals  Acute Rehab OT Goals Patient Stated Goal: not stated OT Goal Formulation: With patient Time For Goal Achievement:  09/15/14 Potential to Achieve Goals: Good ADL Goals Pt Will Perform Lower Body Bathing: with supervision;with adaptive equipment;sit to/from stand Pt Will Perform Lower Body Dressing: with supervision;with adaptive equipment;sit to/from stand  Plan Discharge plan remains appropriate    Co-evaluation                 End of Session Equipment Utilized During Treatment: Rolling walker   Activity Tolerance Patient tolerated treatment well   Patient Left in chair;with call bell/phone within reach   Nurse Communication          Time: [id number] OT Time Calculation (min): 13 min  Charges: OT General Charges $OT Visit: 1 Procedure OT Treatments $Self Care/Home Management : 8-22 mins  Benito Mccreedy OTR/L [id number] 09/09/2014, 9:41 AM

## 2014-09-09 NOTE — Discharge Summary (Signed)
[name], is a 53 y.o. female  DOB 10-21-1961  MRN [id number].  Admission date:  09/07/2014  Admitting Physician  Ivor Costa, MD  Discharge Date:  09/09/2014   Primary MD  Angelica Chessman, MD  Recommendations for primary care physician for things to follow:   Check CBC, CMP, CXR 2 view and CBG control in 1 week   Admission Diagnosis  SOB (shortness of breath) [R06.02] Acute bronchitis with chronic obstructive pulmonary disease (COPD) [J44.1] Hyperglycemia [R73.9] Persistent atrial fibrillation [I48.1] DM (diabetes mellitus), type 2 with peripheral vascular complications [D47.18] COPD with acute exacerbation [J44.1] HCAP (healthcare-associated pneumonia) [J18.9] Acute on chronic diastolic CHF (congestive heart failure), NYHA class 3 [I50.33] Anemia, unspecified anemia type [D64.9] Hypothyroidism, unspecified hypothyroidism type [E03.9]   Discharge Diagnosis  SOB (shortness of breath) [R06.02] Acute bronchitis with chronic obstructive pulmonary disease (COPD) [J44.1] Hyperglycemia [R73.9] Persistent atrial fibrillation [I48.1] DM (diabetes mellitus), type 2 with peripheral vascular complications [Z50.15] COPD with acute exacerbation [J44.1] HCAP (healthcare-associated pneumonia) [J18.9] Acute on chronic diastolic CHF (congestive heart failure), NYHA class 3 [I50.33] Anemia, unspecified anemia type [D64.9] Hypothyroidism, unspecified hypothyroidism type [E03.9]     Principal Problem:   HCAP (healthcare-associated pneumonia) Active Problems:   COPD (chronic obstructive pulmonary disease)   Atrial fibrillation   DM (diabetes mellitus), type 2 with peripheral vascular complications   Long term current use of anticoagulant therapy   Hypothyroidism   S/P peripheral artery angioplasty - TurboHawk atherectomy; R SFA  Acute on chronic diastolic CHF (congestive heart failure), NYHA class 3      Past Medical History  Diagnosis Date  . COPD (chronic obstructive pulmonary disease)   . Alcohol abuse   . Narcotic abuse   . Cocaine abuse   . Marijuana abuse   . Poorly controlled diabetes mellitus   . COPD (chronic obstructive pulmonary disease)   . Tobacco abuse   . Heart failure   . Alcoholic cirrhosis   . Cardiomyopathy   . Peripheral arterial disease   . Obesity   . CHF (congestive heart failure)   . Hypothyroidism   . GERD (gastroesophageal reflux disease)   . Headache(784.0)     "@ least once or twice/wk" (09/10/2013)  . Arthritis     "hands, arms, shoulders, legs, back" (09/10/2013)  . Atrial fibrillation   . Critical lower limb ischemia   . Peripheral arterial disease     Past Surgical History  Procedure Laterality Date  . Lower extremity angiogram Right 09/10/2013    turbo hawk directional arthrectomy of a diffusely diseased SFA/notes 09/10/2013   . Cardioversion  ~ 02/2013    "twice" (09/10/2013)       History of present illness and  Hospital Course:     Kindly see H&P for history of present illness and admission details, please review complete Labs, Consult reports and Test reports for all details in brief  HPI  from the history and physical done on the day of admission  [name] is a 53 y.o. female  with PMH of with a history of chronic diastolic CHF, DM, medication non-compliance, tobacco abuse, alcohol abuse, COPD, hypothyroidism, paroxysmal AFIB on Xarelto, PAD with critical limb ischemia, h/o alcohol and h/o cocaine abuse presents  Patient reports that in the past 3 weeks she has not been feeling well. She feels like she gained a lot of fluid in her body. Her leg edema has been worsening. She also has cough with white colored sputum production. She feels hot, no chills. She did not measure her body temperature at home. She does not have chest pain. She does not have  nausea, vomiting, abdominal pain, diarrhea. No pain over calf areas. No long distance travel history. No cold-like symptoms, such as sore throat or runny nose.  Patient is found to have elevated proBNP 1423, negative troponin, leukocytosis with WBC 16.9. CXR showed multiple focal pneumonia. She is admitted to inpatient for further evaluation treatment.   Hospital Course    1. Acute hypoxic respiratory failure due to combination of HCAP and acute on chronic diastolic CHF. EF 60%. Responded well to empiric IV ABX, negative cultures including influenza panel thus far, transitioned to 5 more days of oral Levaquin, no oxygen need, ambulated well without discomfort. Continue home nebulizer treatments. Request PCP to repeat CBC, CMP and a 2 view chest x-ray in a week.   2. Acute on chronic diastolic CHF EF 41%. Placed on salt and fluid restriction, written instructions given for the same, responded very well to IV Lasix for diuresis and diuresed close to 4 L so far. Continue beta blocker, added nitro paste, doubled home dose Lasix. Strictly counseled on fluid and salt restriction.   3. COPD. No wheezing on exam, no acute issues, supportive care, conseled to quit smoking.     4. 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 Continue home regimen, outpatient follow-up with PCP for glycemic control.   5. History of paroxysmal atrial fibrillation. Currently in sinus, was stable on telemetry monitor, Coreg, continue xaralto.    6. Hypothyroidism. On home dose Synthroid. Repeated TSH.   Lab Results   Component  Value  Date    TSH  1.340  09/07/2014      7. Smoking. Counseled the patient to quit smoking.   8. Severe PAD. On aspirin Plavix and Diabetes medications for second prevention.      Discharge Condition: stable   Follow UP  Follow-up Information   Follow up with JEGEDE, OLUGBEMIGA, MD. Schedule an appointment as soon as possible for a visit in 1 week.   Specialty:   Internal Medicine   Contact information:   [place] [address] [phone]       Follow up with [facility], MD. Schedule an appointment as soon as possible for a visit in 1 week.   Specialty:  Pulmonary Disease   Contact information:   [place] [address] [phone]         Discharge Instructions  and  Discharge Medications     Discharge Instructions   Discharge instructions    Complete by:  As directed   Follow with Primary MD Angelica Chessman, MD in 7 days   Get CBC, CMP, 2 view Chest X ray checked  by Primary MD next visit.    Activity: As tolerated with Full fall precautions use walker/cane & assistance as needed   Disposition Home     Diet: Heart Healthy Low Carb ,   Check your Weight same time everyday, if  you gain over 2 pounds, or you develop in leg swelling, experience more shortness of breath or chest pain, call your Primary MD immediately. Follow Cardiac Low Salt Diet and 1.5 lit/day fluid restriction.  Accuchecks 4 times/day, Once in AM empty stomach and then before each meal. Log in all results and show them to your Prim.MD in 7 days. If any glucose reading is under 80 or above 300 call your Prim MD immidiately. Follow Low glucose instructions for glucose under 80 as instructed.   On your next visit with your primary care physician please Get Medicines reviewed and adjusted.   Please request your Prim.MD to go over all Hospital Tests and Procedure/Radiological results at the follow up, please get all Hospital records sent to your Prim MD by signing hospital release before you go home.   If you experience worsening of your admission symptoms, develop shortness of breath, life threatening emergency, suicidal or homicidal thoughts you must seek medical attention immediately by calling 911 or calling your MD immediately  if symptoms less severe.  You Must read complete instructions/literature along with all the  possible adverse reactions/side effects for all the Medicines you take and that have been prescribed to you. Take any new Medicines after you have completely understood and accpet all the possible adverse reactions/side effects.   Do not drive, operating heavy machinery, perform activities at heights, swimming or participation in water activities or provide baby sitting services if your were admitted for syncope or siezures until you have seen by Primary MD or a Neurologist and advised to do so again.  Do not drive when taking Pain medications.    Do not take more than prescribed Pain, Sleep and Anxiety Medications  Special Instructions: If you have smoked or chewed Tobacco  in the last 2 yrs please stop smoking, stop any regular Alcohol  and or any Recreational drug use.  Wear Seat belts while driving.   Please note  You were cared for by a hospitalist during your hospital stay. If you have any questions about your discharge medications or the care you received while you were in the hospital after you are discharged, you can call the unit and asked to speak with the hospitalist on call if the hospitalist that took care of you is not available. Once you are discharged, your primary care physician will handle any further medical issues. Please note that NO REFILLS for any discharge medications will be authorized once you are discharged, as it is imperative that you return to your primary care physician (or establish a relationship with a primary care physician if you do not have one) for your aftercare needs so that they can reassess your need for medications and monitor your lab values.     Increase activity slowly    Complete by:  As directed             Medication List         ACCU-CHEK AVIVA PLUS test strip  Generic drug:  glucose blood  1 each by Other route See admin instructions. Check blood sugar twice daily.     ACCU-CHEK SOFTCLIX LANCETS lancets  1 each by Other route See  admin instructions. Check blood sugar twice daily.     albuterol 108 (90 BASE) MCG/ACT inhaler  Commonly known as:  PROVENTIL HFA;VENTOLIN HFA  Inhale 1-2 puffs into the lungs every 4 (four) hours as needed for wheezing or shortness of breath.     aspirin EC 81 MG  tablet  Take 1 tablet (81 mg total) by mouth daily.     carvedilol 6.25 MG tablet  Commonly known as:  COREG  Take 1 tablet (6.25 mg total) by mouth 2 (two) times daily with a meal.     clopidogrel 75 MG tablet  Commonly known as:  PLAVIX  Take 1 tablet (75 mg total) by mouth daily with breakfast.     furosemide 40 MG tablet  Commonly known as:  LASIX  Take 1 tablet (40 mg total) by mouth 2 (two) times daily.     gabapentin 300 MG capsule  Commonly known as:  NEURONTIN  Take 1 capsule (300 mg total) by mouth 2 (two) times daily.     glipiZIDE 10 MG 24 hr tablet  Commonly known as:  GLUCOTROL XL  Take 30 mg by mouth daily with breakfast.     insulin glargine 100 UNIT/ML injection  Commonly known as:  LANTUS  Inject 35 Units into the skin at bedtime.     insulin NPH-regular Human (70-30) 100 UNIT/ML injection  Commonly known as:  NOVOLIN 70/30  Inject 4-10 Units into the skin 3 (three) times daily. Per sliding scale.     ipratropium-albuterol 0.5-2.5 (3) MG/3ML Soln  Commonly known as:  DUONEB  Take 3 mLs by nebulization every 6 (six) hours as needed (for shortness of breath).     isosorbide mononitrate 30 MG 24 hr tablet  Commonly known as:  IMDUR  Take 1 tablet (30 mg total) by mouth daily.     levofloxacin 750 MG tablet  Commonly known as:  LEVAQUIN  Take 1 tablet (750 mg total) by mouth daily.     levothyroxine 50 MCG tablet  Commonly known as:  SYNTHROID, LEVOTHROID  Take 1 tablet (50 mcg total) by mouth daily.     lisinopril 20 MG tablet  Commonly known as:  PRINIVIL,ZESTRIL  Take 20 mg by mouth daily.     metFORMIN 1000 MG tablet  Commonly known as:  GLUCOPHAGE  Take 1,000 mg by mouth 2 (two)  times daily with a meal.     oxyCODONE 5 MG immediate release tablet  Commonly known as:  Oxy IR/ROXICODONE  Take 5 mg by mouth every 8 (eight) hours.     rivaroxaban 20 MG Tabs tablet  Commonly known as:  XARELTO  Take 1 tablet (20 mg total) by mouth every morning.          Diet and Activity recommendation: See Discharge Instructions above   Consults obtained - none   Major procedures and Radiology Reports - PLEASE review detailed and final reports for all details, in brief -      Dg Chest 2 View  09/07/2014   CLINICAL DATA:  Weakness. Shortness of breath. Edema. High blood sugar.  EXAM: CHEST  2 VIEW  COMPARISON:  06/16/2014  FINDINGS: Shallow inspiration. Linear atelectasis or infiltration demonstrated in the left lung base and right mid lung. Changes may represent multifocal pneumonia. Changes are new since previous study. No blunting of costophrenic angles. No pneumothorax. Heart size and pulmonary vascularity are normal for technique.  IMPRESSION: Shallow inspiration with developing opacities in the right mid lung and left lung base likely representing multifocal pneumonia.   Electronically Signed   By: Lucienne Capers M.D.   On: 09/07/2014 04:59       Micro Results      Recent Results (from the past 240 hour(s))  CULTURE, BLOOD (ROUTINE X 2)  Status: None   Collection Time    09/07/14  5:55 AM      Result Value Ref Range Status   Specimen Description BLOOD RIGHT ANTECUBITAL   Final   Special Requests BOTTLES DRAWN AEROBIC ONLY 5CC   Final   Culture  Setup Time     Final   Value: 09/07/2014 09:51     Performed at [facility]   Culture     Final   Value:        BLOOD CULTURE RECEIVED NO GROWTH TO DATE CULTURE WILL BE HELD FOR 5 DAYS BEFORE ISSUING A FINAL NEGATIVE REPORT     Performed at [facility]   Report Status PENDING   Incomplete  CULTURE, BLOOD (ROUTINE X 2)     Status: None   Collection Time    09/07/14  6:00 AM      Result  Value Ref Range Status   Specimen Description BLOOD RIGHT HAND   Final   Special Requests BOTTLES DRAWN AEROBIC AND ANAEROBIC 5CC EA   Final   Culture  Setup Time     Final   Value: 09/07/2014 09:51     Performed at [facility]   Culture     Final   Value:        BLOOD CULTURE RECEIVED NO GROWTH TO DATE CULTURE WILL BE HELD FOR 5 DAYS BEFORE ISSUING A FINAL NEGATIVE REPORT     Performed at [facility]   Report Status PENDING   Incomplete  RESPIRATORY VIRUS PANEL     Status: None   Collection Time    09/07/14  8:30 AM      Result Value Ref Range Status   Source - [facility]AN NASAL SWAB   Corrected   Comment: CORRECTED ON 10/26 AT 1820: PREVIOUSLY REPORTED AS NASAL SWAB   Respiratory Syncytial Virus A NOT DETECTED   Final   Respiratory Syncytial Virus B NOT DETECTED   Final   Influenza A NOT DETECTED   Final   Influenza B NOT DETECTED   Final   Parainfluenza 1 NOT DETECTED   Final   Parainfluenza 2 NOT DETECTED   Final   Parainfluenza 3 NOT DETECTED   Final   Metapneumovirus NOT DETECTED   Final   Rhinovirus NOT DETECTED   Final   Adenovirus NOT DETECTED   Final   Influenza A H1 NOT DETECTED   Final   Influenza A H3 NOT DETECTED   Final   Comment: (NOTE)           Normal Reference Range for each Analyte: NOT DETECTED     Testing performed using the Luminex xTAG Respiratory Viral Panel test     kit.     The analytical performance characteristics of this assay have been     determined by [facility].  The modifications have not been     cleared or approved by the FDA. This assay has been validated pursuant     to the CLIA regulations and is used for clinical purposes.     Performed at [facility]       Today   Subjective:   [name] today has no headache,no chest abdominal pain,no new weakness tingling or numbness, feels much better wants to go home today.    Objective:   Blood pressure 141/67, pulse 85, temperature 98.3 F (36.8 C),  temperature source Oral, resp. rate 19, height '5\' 4"'  (1.626 m), weight 101.9  kg (224 lb 10.4 oz), last menstrual period 11/27/2012, SpO2 99.00%.   Intake/Output Summary (Last 24 hours) at 09/09/14 0752 Last data filed at 09/09/14 0553  Gross per 24 hour  Intake    680 ml  Output   3050 ml  Net  -2370 ml    Exam Awake Alert, Oriented x 3, No new F.N deficits, Normal affect [employer].AT,PERRAL Supple Neck,No JVD, No cervical lymphadenopathy appriciated.  Symmetrical Chest wall movement, Good air movement bilaterally, no rales or wheezes RRR,No Gallops,Rubs or new Murmurs, No Parasternal Heave +ve B.Sounds, Abd Soft, Non tender, No organomegaly appriciated, No rebound -guarding or rigidity. No Cyanosis, Clubbing or edema, No new Rash or bruise  Data Review   CBC w Diff: Lab Results  Component Value Date   WBC 16.1* 09/08/2014   HGB 9.4* 09/08/2014   HCT 29.4* 09/08/2014   PLT 382 09/08/2014   LYMPHOPCT 14 09/07/2014   MONOPCT 6 09/07/2014   EOSPCT 2 09/07/2014   BASOPCT 0 09/07/2014    CMP: Lab Results  Component Value Date   NA 134* 09/08/2014   K 4.5 09/08/2014   CL 96 09/08/2014   CO2 23 09/08/2014   BUN 20 09/08/2014   CREATININE 0.93 09/08/2014   CREATININE 0.90 09/06/2013   PROT 7.1 09/07/2014   ALBUMIN 2.5* 09/07/2014   BILITOT <0.2* 09/07/2014   ALKPHOS 145* 09/07/2014   AST 13 09/07/2014   ALT 12 09/07/2014  .   Total Time in preparing paper work, data evaluation and todays exam - 35 minutes  Thurnell Lose M.D on 09/09/2014 at 7:52 AM  [facility] Office  [phone]

## 2014-09-09 NOTE — Progress Notes (Signed)
Patient given discharge instructions and all questions answered.  Patient discharged via wheelchair with all belongings.   

## 2014-09-09 NOTE — Progress Notes (Signed)
DC IV, DC Tele, DC Home. Discharge instructions and home medications discussed with patient and family member. Patient and family member denied any questions or concerns at his time. Patient leaving unit via wheelchair and appears in no acute distress.

## 2014-09-09 NOTE — Discharge Instructions (Signed)
Follow with Primary MD Angelica Chessman, MD in 7 days   Get CBC, CMP, 2 view Chest X ray checked  by Primary MD next visit.    Activity: As tolerated with Full fall precautions use walker/cane & assistance as needed   Disposition Home     Diet: Heart Healthy Low Carb ,   Check your Weight same time everyday, if you gain over 2 pounds, or you develop in leg swelling, experience more shortness of breath or chest pain, call your Primary MD immediately. Follow Cardiac Low Salt Diet and 1.5 lit/day fluid restriction.  Accuchecks 4 times/day, Once in AM empty stomach and then before each meal. Log in all results and show them to your Prim.MD in 7 days. If any glucose reading is under 80 or above 300 call your Prim MD immidiately. Follow Low glucose instructions for glucose under 80 as instructed.   On your next visit with your primary care physician please Get Medicines reviewed and adjusted.   Please request your Prim.MD to go over all Hospital Tests and Procedure/Radiological results at the follow up, please get all Hospital records sent to your Prim MD by signing hospital release before you go home.   If you experience worsening of your admission symptoms, develop shortness of breath, life threatening emergency, suicidal or homicidal thoughts you must seek medical attention immediately by calling 911 or calling your MD immediately  if symptoms less severe.  You Must read complete instructions/literature along with all the possible adverse reactions/side effects for all the Medicines you take and that have been prescribed to you. Take any new Medicines after you have completely understood and accpet all the possible adverse reactions/side effects.   Do not drive, operating heavy machinery, perform activities at heights, swimming or participation in water activities or provide baby sitting services if your were admitted for syncope or siezures until you have seen by Primary MD or a  Neurologist and advised to do so again.  Do not drive when taking Pain medications.    Do not take more than prescribed Pain, Sleep and Anxiety Medications  Special Instructions: If you have smoked or chewed Tobacco  in the last 2 yrs please stop smoking, stop any regular Alcohol  and or any Recreational drug use.  Wear Seat belts while driving.   Please note  You were cared for by a hospitalist during your hospital stay. If you have any questions about your discharge medications or the care you received while you were in the hospital after you are discharged, you can call the unit and asked to speak with the hospitalist on call if the hospitalist that took care of you is not available. Once you are discharged, your primary care physician will handle any further medical issues. Please note that NO REFILLS for any discharge medications will be authorized once you are discharged, as it is imperative that you return to your primary care physician (or establish a relationship with a primary care physician if you do not have one) for your aftercare needs so that they can reassess your need for medications and monitor your lab values.

## 2014-09-12 NOTE — ED Provider Notes (Signed)
CSN: [id number]     Arrival date & time 09/07/14  [id number] History   First MD Initiated Contact with Patient 09/07/14 0534     Chief Complaint  Patient presents with  . Hyperglycemia  . Weakness  . Shortness of Breath  . Leg Swelling     (Consider location/radiation/quality/duration/timing/severity/associated sxs/prior Treatment) HPI Comments: 53 year old female with history of COPD, alcohol abuse, cardiomyopathy, cirrhosis, atrial fibrillation, diabetes, cocaine abuse, uncontrolled diabetes  presents to the ED with general weakness, gradually worsening shortness of breath, cough and bilateral leg edema for the past 2-3 weeks. Symptoms have gradually worsened. Subjective fever at home. Patient has mild epigastric discomfort, nonradiating. Shortness of breath fairly constant at times worse with lying flat.   Patient is a 53 y.o. female presenting with hyperglycemia, weakness, and shortness of breath. The history is provided by the patient and medical records.  Hyperglycemia Associated symptoms: abdominal pain, fever and shortness of breath   Associated symptoms: no chest pain, no dysuria and no vomiting   Weakness Associated symptoms include abdominal pain and shortness of breath. Pertinent negatives include no chest pain and no headaches.  Shortness of Breath Associated symptoms: abdominal pain, cough and fever   Associated symptoms: no chest pain, no headaches, no neck pain, no rash and no vomiting     Past Medical History  Diagnosis Date  . COPD (chronic obstructive pulmonary disease)   . Alcohol abuse   . Narcotic abuse   . Cocaine abuse   . Marijuana abuse   . Poorly controlled diabetes mellitus   . COPD (chronic obstructive pulmonary disease)   . Tobacco abuse   . Heart failure   . Alcoholic cirrhosis   . Cardiomyopathy   . Peripheral arterial disease   . Obesity   . CHF (congestive heart failure)   . Hypothyroidism   . GERD (gastroesophageal reflux disease)   .  Headache(784.0)     "@ least once or twice/wk" (09/10/2013)  . Arthritis     "hands, arms, shoulders, legs, back" (09/10/2013)  . Atrial fibrillation   . Critical lower limb ischemia   . Peripheral arterial disease    Past Surgical History  Procedure Laterality Date  . Lower extremity angiogram Right 09/10/2013    turbo hawk directional arthrectomy of a diffusely diseased SFA/notes 09/10/2013   . Cardioversion  ~ 02/2013    "twice" (09/10/2013)   Family History  Problem Relation Age of Onset  . Hypertension Mother   . Diabetes Mother   . Cancer Mother     breast, ovarian, colon  . Hypertension Father    History  Substance Use Topics  . Smoking status: Current Every Day Smoker -- 1.50 packs/day for 38 years    Types: Cigarettes  . Smokeless tobacco: Never Used  . Alcohol Use: 0.6 oz/week    1 Cans of beer per week     Comment: 09/10/2013 "might drink 1 beer/wk; never had problem w/it"   OB History   Grav Para Term Preterm Abortions TAB SAB Ect Mult Living   1 1 1       1      Review of Systems  Constitutional: Positive for fever. Negative for chills.  HENT: Negative for congestion.   Eyes: Negative for visual disturbance.  Respiratory: Positive for cough and shortness of breath.   Cardiovascular: Negative for chest pain.  Gastrointestinal: Positive for abdominal pain. Negative for vomiting.  Genitourinary: Negative for dysuria and flank pain.  Musculoskeletal: Negative for back pain, neck  pain and neck stiffness.  Skin: Negative for rash.  Neurological: Positive for weakness. Negative for syncope, light-headedness and headaches.      Allergies  Review of patient's allergies indicates no known allergies.  Home Medications   Prior to Admission medications   Medication Sig Start Date End Date Taking? Authorizing Provider  aspirin EC 81 MG tablet Take 1 tablet (81 mg total) by mouth daily. 07/31/13  Yes Renella Cunas, MD  carvedilol (COREG) 6.25 MG tablet Take 1  tablet (6.25 mg total) by mouth 2 (two) times daily with a meal. 06/18/14  Yes Jonetta Osgood, MD  clopidogrel (PLAVIX) 75 MG tablet Take 1 tablet (75 mg total) by mouth daily with breakfast. 06/18/14  Yes Shanker Kristeen Mans, MD  gabapentin (NEURONTIN) 300 MG capsule Take 1 capsule (300 mg total) by mouth 2 (two) times daily. 06/18/14  Yes Shanker Kristeen Mans, MD  glipiZIDE (GLUCOTROL XL) 10 MG 24 hr tablet Take 30 mg by mouth daily with breakfast.   Yes Historical Provider, MD  insulin glargine (LANTUS) 100 UNIT/ML injection Inject 35 Units into the skin at bedtime.   Yes Historical Provider, MD  insulin NPH-regular Human (NOVOLIN 70/30) (70-30) 100 UNIT/ML injection Inject 4-10 Units into the skin 3 (three) times daily. Per sliding scale.   Yes Historical Provider, MD  levothyroxine (SYNTHROID, LEVOTHROID) 50 MCG tablet Take 1 tablet (50 mcg total) by mouth daily. 06/18/14  Yes Shanker Kristeen Mans, MD  lisinopril (PRINIVIL,ZESTRIL) 20 MG tablet Take 20 mg by mouth daily.   Yes Historical Provider, MD  metFORMIN (GLUCOPHAGE) 1000 MG tablet Take 1,000 mg by mouth 2 (two) times daily with a meal.   Yes Historical Provider, MD  oxyCODONE (OXY IR/ROXICODONE) 5 MG immediate release tablet Take 5 mg by mouth every 8 (eight) hours.   Yes Historical Provider, MD  rivaroxaban (XARELTO) 20 MG TABS tablet Take 1 tablet (20 mg total) by mouth every morning. 06/18/14  Yes Shanker Kristeen Mans, MD  ACCU-CHEK SOFTCLIX LANCETS lancets 1 each by Other route See admin instructions. Check blood sugar twice daily.    Historical Provider, MD  albuterol (PROVENTIL HFA;VENTOLIN HFA) 108 (90 BASE) MCG/ACT inhaler Inhale 1-2 puffs into the lungs every 4 (four) hours as needed for wheezing or shortness of breath. 09/09/14   Thurnell Lose, MD  furosemide (LASIX) 40 MG tablet Take 1 tablet (40 mg total) by mouth 2 (two) times daily. 09/09/14   Thurnell Lose, MD  glucose blood (ACCU-CHEK AVIVA PLUS) test strip 1 each by Other route See  admin instructions. Check blood sugar twice daily.    Historical Provider, MD  ipratropium-albuterol (DUONEB) 0.5-2.5 (3) MG/3ML SOLN Take 3 mLs by nebulization every 6 (six) hours as needed (for shortness of breath). 09/09/14   Thurnell Lose, MD  isosorbide mononitrate (IMDUR) 30 MG 24 hr tablet Take 1 tablet (30 mg total) by mouth daily. 09/09/14   Thurnell Lose, MD  levofloxacin (LEVAQUIN) 750 MG tablet Take 1 tablet (750 mg total) by mouth daily. 09/09/14   Thurnell Lose, MD   BP 136/70  Pulse 86  Temp(Src) 98.3 F (36.8 C) (Oral)  Resp 19  Ht 5\' 4"  (1.626 m)  Wt 224 lb 10.4 oz (101.9 kg)  BMI 38.54 kg/m2  SpO2 99%  LMP 11/27/2012 Physical Exam  Nursing note and vitals reviewed. Constitutional: She is oriented to person, place, and time. She appears well-developed and well-nourished.  HENT:  Head: Normocephalic and atraumatic.  Eyes: Conjunctivae are normal. Right eye exhibits no discharge. Left eye exhibits no discharge.  Neck: Normal range of motion. Neck supple. No tracheal deviation present.  Cardiovascular: Normal rate and regular rhythm.   Pulmonary/Chest: No respiratory distress. She has no wheezes. She has rales (crackles at bases bilateral).  Abdominal: Soft. She exhibits no distension. There is no tenderness. There is no guarding.  Musculoskeletal: She exhibits edema (mild bilateral).  Neurological: She is alert and oriented to person, place, and time.  Skin: Skin is warm.  Psychiatric: She has a normal mood and affect.    ED Course  Procedures (including critical care time) Labs Review Labs Reviewed  CBC WITH DIFFERENTIAL - Abnormal; Notable for the following:    WBC 16.9 (*)    RBC 3.29 (*)    Hemoglobin 9.4 (*)    HCT 29.4 (*)    Platelets 408 (*)    Neutrophils Relative % 78 (*)    Neutro Abs 13.2 (*)    Monocytes Absolute 1.1 (*)    All other components within normal limits  COMPREHENSIVE METABOLIC PANEL - Abnormal; Notable for the following:     Glucose, Bld 235 (*)    Albumin 2.5 (*)    Alkaline Phosphatase 145 (*)    Total Bilirubin <0.2 (*)    GFR calc non Af Amer 70 (*)    GFR calc Af Amer 81 (*)    All other components within normal limits  PROTIME-INR - Abnormal; Notable for the following:    Prothrombin Time 23.2 (*)    INR 2.04 (*)    All other components within normal limits  PRO B NATRIURETIC PEPTIDE - Abnormal; Notable for the following:    Pro B Natriuretic peptide (BNP) 1423.0 (*)    All other components within normal limits  BASIC METABOLIC PANEL - Abnormal; Notable for the following:    Glucose, Bld 123 (*)    GFR calc non Af Amer 73 (*)    GFR calc Af Amer 84 (*)    All other components within normal limits  GLUCOSE, CAPILLARY - Abnormal; Notable for the following:    Glucose-Capillary 137 (*)    All other components within normal limits  GLUCOSE, CAPILLARY - Abnormal; Notable for the following:    Glucose-Capillary 273 (*)    All other components within normal limits  GLUCOSE, CAPILLARY - Abnormal; Notable for the following:    Glucose-Capillary 266 (*)    All other components within normal limits  CBC - Abnormal; Notable for the following:    WBC 16.1 (*)    RBC 3.29 (*)    Hemoglobin 9.4 (*)    HCT 29.4 (*)    All other components within normal limits  BASIC METABOLIC PANEL - Abnormal; Notable for the following:    Sodium 134 (*)    Glucose, Bld 191 (*)    Calcium 8.3 (*)    GFR calc non Af Amer 70 (*)    GFR calc Af Amer 81 (*)    All other components within normal limits  GLUCOSE, CAPILLARY - Abnormal; Notable for the following:    Glucose-Capillary 316 (*)    All other components within normal limits  GLUCOSE, CAPILLARY - Abnormal; Notable for the following:    Glucose-Capillary 185 (*)    All other components within normal limits  GLUCOSE, CAPILLARY - Abnormal; Notable for the following:    Glucose-Capillary 455 (*)    All other components within normal limits  GLUCOSE, CAPILLARY -  Abnormal; Notable for the following:    Glucose-Capillary 168 (*)    All other components within normal limits  GLUCOSE, CAPILLARY - Abnormal; Notable for the following:    Glucose-Capillary 163 (*)    All other components within normal limits  GLUCOSE, CAPILLARY - Abnormal; Notable for the following:    Glucose-Capillary 146 (*)    All other components within normal limits  GLUCOSE, CAPILLARY - Abnormal; Notable for the following:    Glucose-Capillary 274 (*)    All other components within normal limits  CBG MONITORING, ED - Abnormal; Notable for the following:    Glucose-Capillary 244 (*)    All other components within normal limits  CBG MONITORING, ED - Abnormal; Notable for the following:    Glucose-Capillary 174 (*)    All other components within normal limits  CULTURE, BLOOD (ROUTINE X 2)  CULTURE, BLOOD (ROUTINE X 2)  RESPIRATORY VIRUS PANEL  CULTURE, EXPECTORATED SPUTUM-ASSESSMENT  GRAM STAIN  MAGNESIUM  TSH  TROPONIN I  TROPONIN I  TROPONIN I  LEGIONELLA ANTIGEN, URINE  STREP PNEUMONIAE URINARY ANTIGEN  MAGNESIUM  I-STAT TROPOININ, ED    Imaging Review No results found. Dg Chest 2 View  09/07/2014   CLINICAL DATA:  Weakness. Shortness of breath. Edema. High blood sugar.  EXAM: CHEST  2 VIEW  COMPARISON:  06/16/2014  FINDINGS: Shallow inspiration. Linear atelectasis or infiltration demonstrated in the left lung base and right mid lung. Changes may represent multifocal pneumonia. Changes are new since previous study. No blunting of costophrenic angles. No pneumothorax. Heart size and pulmonary vascularity are normal for technique.  IMPRESSION: Shallow inspiration with developing opacities in the right mid lung and left lung base likely representing multifocal pneumonia.   Electronically Signed   By: Lucienne Capers M.D.   On: 09/07/2014 04:59   Mm Digital Screening Bilateral  09/04/2014   CLINICAL DATA:  Screening.  EXAM: DIGITAL SCREENING BILATERAL MAMMOGRAM WITH  CAD  COMPARISON:  Previous exam(s).  ACR Breast Density Category b: There are scattered areas of fibroglandular density.  FINDINGS: There are no findings suspicious for malignancy. Images were processed with CAD.  IMPRESSION: No mammographic evidence of malignancy. A result letter of this screening mammogram will be mailed directly to the patient.  RECOMMENDATION: Screening mammogram in one year. (Code:[id number]-B-01Y)  BI-RADS CATEGORY  1: Negative.   Electronically Signed   By: Hassan Rowan M.D.   On: 09/04/2014 17:38    EKG Interpretation   Date/Time:  Sunday September 07 2014 04:20:01 EDT Ventricular Rate:  96 PR Interval:  136 QRS Duration: 80 QT Interval:  372 QTC Calculation: 469 R Axis:   11 Text Interpretation:  Normal sinus rhythm Septal infarct , age  undetermined Abnormal ECG similar to previous Confirmed by Demisha Nokes  MD,  Quay Simkin ([id number]) on 09/07/2014 5:21:16 AM      MDM   Final diagnoses:  HCAP (healthcare-associated pneumonia)  Acute bronchitis with chronic obstructive pulmonary disease (COPD)  Anemia, unspecified anemia type  Hyperglycemia    concern clinically for pneumonia  With component of mild pulmonary edema  antibiotics and cultures ordered. Discussed case with tried hospitalist who agreed with admission.  Chest x-ray concerning for multifocal pneumonia reviewed.  The patients results and plan were reviewed and discussed.   Any x-rays performed were personally reviewed by myself.   Differential diagnosis were considered with the presenting HPI.  Medications  piperacillin-tazobactam (ZOSYN) IVPB 3.375 g (0 g Intravenous Stopped 09/07/14 0658)  albuterol (PROVENTIL) (2.5 MG/3ML) 0.083%  nebulizer solution 5 mg (5 mg Nebulization Given 09/07/14 0611)  vancomycin (VANCOCIN) 1,500 mg in sodium chloride 0.9 % 500 mL IVPB (1,500 mg Intravenous New Bag/Given 09/07/14 0628)  vancomycin (VANCOCIN) IVPB 750 mg/150 ml premix (750 mg Intravenous Given 09/07/14 1301)  furosemide  (LASIX) 10 MG/ML injection (  Duplicate 0000000 [id number])  furosemide (LASIX) 10 MG/ML injection (  Duplicate 0000000 [id number])  insulin aspart (novoLOG) injection 15 Units (15 Units Subcutaneous Given 09/08/14 1221)  zolpidem (AMBIEN) tablet 5 mg (5 mg Oral Given 09/08/14 2104)    Filed Vitals:   09/08/14 2355 09/09/14 0551 09/09/14 0830 09/09/14 1019  BP:  141/67 129/75 136/70  Pulse:  85 86 86  Temp:  98.3 F (36.8 C)    TempSrc:  Oral    Resp:  19    Height:      Weight:  224 lb 10.4 oz (101.9 kg)    SpO2: 96% 99%      Final diagnoses:  HCAP (healthcare-associated pneumonia)  Acute bronchitis with chronic obstructive pulmonary disease (COPD)  Anemia, unspecified anemia type  Hyperglycemia    Admission/ observation were discussed with the admitting physician, patient and/or family and they are comfortable with the plan.    Mariea Clonts, MD 09/12/14 [phone]

## 2014-09-13 LAB — CULTURE, BLOOD (ROUTINE X 2)
CULTURE: NO GROWTH
Culture: NO GROWTH

## 2014-09-15 ENCOUNTER — Encounter (HOSPITAL_COMMUNITY): Payer: Self-pay | Admitting: Emergency Medicine

## 2014-09-16 ENCOUNTER — Encounter: Payer: Self-pay | Admitting: Internal Medicine

## 2014-09-16 ENCOUNTER — Ambulatory Visit (INDEPENDENT_AMBULATORY_CARE_PROVIDER_SITE_OTHER): Payer: Medicaid Other | Admitting: Internal Medicine

## 2014-09-16 ENCOUNTER — Ambulatory Visit (INDEPENDENT_AMBULATORY_CARE_PROVIDER_SITE_OTHER)
Admission: RE | Admit: 2014-09-16 | Discharge: 2014-09-16 | Disposition: A | Discharge status: Home / Self Care | Payer: Medicaid Other | Source: Ambulatory Visit | Attending: Internal Medicine | Admitting: Internal Medicine

## 2014-09-16 VITALS — BP 152/78 | HR 115 | Ht 64.0 in | Wt 221.8 lb

## 2014-09-16 DIAGNOSIS — J449 Chronic obstructive pulmonary disease, unspecified: Secondary | ICD-10-CM

## 2014-09-16 DIAGNOSIS — I429 Cardiomyopathy, unspecified: Secondary | ICD-10-CM | POA: Diagnosis not present

## 2014-09-16 DIAGNOSIS — J189 Pneumonia, unspecified organism: Secondary | ICD-10-CM | POA: Diagnosis not present

## 2014-09-16 DIAGNOSIS — F17210 Nicotine dependence, cigarettes, uncomplicated: Secondary | ICD-10-CM

## 2014-09-16 DIAGNOSIS — Z720 Tobacco use: Secondary | ICD-10-CM | POA: Diagnosis not present

## 2014-09-16 MED ORDER — VALSARTAN 160 MG PO TABS: 160.0000 mg | ORAL_TABLET | Freq: Every day | ORAL | Status: DC

## 2014-09-16 NOTE — Assessment & Plan Note (Signed)
>   3 min discussion  I emphasized that although we never turn away smokers from the pulmonary clinic, we do ask that they understand that the recommendations that we make  won't work nearly as well in the presence of continued cigarette exposure.  In fact, we may very well  reach a point where we can't promise to help the patient if he/she can't quit smoking. (We can and will promise to try to help, we just can't promise what we recommend will really work)    

## 2014-09-16 NOTE — Progress Notes (Signed)
Quick Note:  Spoke with pt and notified of results per Dr. Wert. Pt verbalized understanding and denied any questions.  ______ 

## 2014-09-16 NOTE — Assessment & Plan Note (Signed)
ACE inhibitors are problematic in  pts with airway complaints because  even experienced pulmonologists can't always distinguish ace effects from copd/asthma.  By themselves they don't actually cause a problem, much like oxygen can't by itself start a fire, but they certainly serve as a powerful catalyst or enhancer for any "fire"  or inflammatory process in the upper airway, be it caused by an ET  tube or more commonly reflux (especially in the obese or pts with known GERD or who are on biphoshonates).    In the era of ARB near equivalency until we have a better handle on the reversibility of the airway problem, it just makes sense to avoid ACEI  entirely in the short run and then decide later, having established a level of airway control using a reasonable limited regimen, whether to add back ace but even then being very careful to observe the pt for worsening airway control and number of meds used/ needed to control symptoms.    Try diovan 160 mg daily until returns for pfts to regroup

## 2014-09-16 NOTE — Patient Instructions (Addendum)
Stop lisinopril and replace it with diovan (valsartan) 160 mg daily   The key is to stop smoking completely before smoking completely stops you!   Continue to use the nebulizer as needed up to 4 x daily if needed but you should gradually improve especially if you are not smoking   Please remember to go to the  x-ray department downstairs for your tests - we will call you with the results when they are available.     Please schedule a follow up office visit in 4 weeks, sooner if needed with pfts on return Add needs final f/u cxr also on return

## 2014-09-16 NOTE — Assessment & Plan Note (Signed)
DDX of  difficult airways management all start with A and  include Adherence, Ace Inhibitors, Acid Reflux, Active Sinus Disease, Alpha 1 Antitripsin deficiency, Anxiety masquerading as Airways dz,  ABPA,  allergy(esp in young), Aspiration (esp in elderly), Adverse effects of DPI,  Active smokers, plus two Bs  = Bronchiectasis and Beta blocker use..and one C= CHF  Adherence is always the initial "prime suspect" and is a multilayered concern that requires a "trust but verify" approach in every patient - starting with knowing how to use medications, especially inhalers, correctly, keeping up with refills and understanding the fundamental difference between maintenance and prns vs those medications only taken for a very short course and then stopped and not refilled.  - for now no change in resp rx until returns for pfts  ACEi at top of list of usual suspects, needs trial off (the only way to prove cause and effect or lack thereof)  Active smoking other big concern, see sep a/p

## 2014-09-16 NOTE — Assessment & Plan Note (Signed)
Improved but needs f/u cxr at 4 weeks for baseline

## 2014-09-16 NOTE — Progress Notes (Signed)
Subjective:     Patient ID: [name], female   DOB: November 27, 1960   MRN: [id number]  HPI   53 yowf  Active Smoker referred to pulmonary clinic 09/16/2014 p admit  Admission date: 09/07/2014   Discharge Date: 09/09/2014   Discharge Diagnosis SOB (shortness of breath) [R06.02] Acute bronchitis with chronic obstructive pulmonary disease (COPD) [J44.1] Hyperglycemia [R73.9] Persistent atrial fibrillation [I48.1] DM (diabetes mellitus), type 2 with peripheral vascular complications [id number] COPD with acute exacerbation [J44.1] HCAP (healthcare-associated pneumonia) [J18.9] Acute on chronic diastolic CHF (congestive heart failure), NYHA class 3 [I50.33] Anemia, unspecified anemia type [D64.9] Hypothyroidism, unspecified hypothyroidism type [E03.9]   Principal Problem:  HCAP (healthcare-associated pneumonia) Active Problems:  COPD (chronic obstructive pulmonary disease)  Atrial fibrillation  DM (diabetes mellitus), type 2 with peripheral vascular complications  Long term current use of anticoagulant therapy  Hypothyroidism  S/P peripheral artery angioplasty - TurboHawk atherectomy; R SFA  Acute on chronic diastolic CHF (congestive heart failure), NYHA class 3    Past Medical History  Diagnosis Date  . COPD (chronic obstructive pulmonary disease)   . Alcohol abuse   . Narcotic abuse   . Cocaine abuse   . Marijuana abuse   . Poorly controlled diabetes mellitus   . COPD (chronic obstructive pulmonary disease)   . Tobacco abuse   . Heart failure   . Alcoholic cirrhosis   . Cardiomyopathy   . Peripheral arterial disease   . Obesity   . CHF (congestive heart failure)   . Hypothyroidism   . GERD (gastroesophageal reflux disease)   . Headache(784.0)     "@ least once or twice/wk" (09/10/2013)  . Arthritis     "hands, arms, shoulders, legs, back" (09/10/2013)  . Atrial fibrillation   .  Critical lower limb ischemia   . Peripheral arterial disease     Past Surgical History  Procedure Laterality Date  . Lower extremity angiogram Right 09/10/2013    turbo hawk directional arthrectomy of a diffusely diseased SFA/notes 09/10/2013   . Cardioversion  ~ 02/2013    "twice" (09/10/2013)        [name] is a 53 y.o. female with PMH of with a history of chronic diastolic CHF, DM, medication non-compliance, tobacco abuse, alcohol abuse, COPD, hypothyroidism, paroxysmal AFIB on Xarelto, PAD with critical limb ischemia, h/o alcohol and h/o cocaine abuse presents x 3 weeks   not been feeling well. She feels like she gained a lot of fluid in her body. Her leg edema has been worsening. She also has cough with white colored sputum production. She feels hot, no chills. She did not measure her body temperature at home. She does not have chest pain. She does not have nausea, vomiting, abdominal pain, diarrhea. No pain over calf areas. No long distance travel history. No cold-like symptoms, such as sore throat or runny nose.  Patient is found to have elevated proBNP 1423, negative troponin, leukocytosis with WBC 16.9. CXR showed multiple focal pneumonia. She is admitted to inpatient for further evaluation treatment.   Hospital Course    1. Acute hypoxic respiratory failure due to combination of HCAP and acute on chronic diastolic CHF. EF 60%. Responded well to empiric IV ABX, negative cultures including influenza panel thus far, transitioned to 5 more days of oral Levaquin, no oxygen need, ambulated well without discomfort. Continue home nebulizer treatments. Request PCP to repeat CBC, CMP and a 2 view chest x-ray in a week.   2. Acute on chronic diastolic CHF  EF 60%. Placed on salt and fluid restriction, written instructions given for the same, responded very well to IV Lasix for diuresis and diuresed close to 4 L so far. Continue beta blocker, added nitro paste,  doubled home dose Lasix. Strictly counseled on fluid and salt restriction.   3. COPD. No wheezing on exam, no acute issues, supportive care, conseled to quit smoking.     4. 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 Continue home regimen, outpatient follow-up with PCP for glycemic control.   5. History of paroxysmal atrial fibrillation. Currently in sinus, was stable on telemetry monitor, Coreg, continue xaralto.    6. Hypothyroidism. On home dose Synthroid. Repeated TSH.   Lab Results   Component  Value  Date    TSH  1.340  09/07/2014      7. Smoking. Counseled the patient to quit smoking.   8. Severe PAD. On aspirin Plavix and Diabetes medications for second prevention.    09/16/2014 1st [facility] office visit/ Orvie Caradine  / on ACEi  Chief Complaint  Patient presents with  . HFU    Pt reports her breathing has improved back to baseline since hospital d/c. She still has some cough in the am- white sputum.   chronic doe x across the room  X one year  Not much change between good and bad days /neb helps some Cough is mostly in ams    No obvious day to day or daytime variabilty or assoc   cp or chest tightness, subjective wheeze overt sinus or hb symptoms. No unusual exp hx or h/o childhood pna/ asthma or knowledge of premature birth.  Sleeping ok without nocturnal  or early am exacerbation  of respiratory  c/o's or need for noct saba. Also denies any obvious fluctuation of symptoms with weather or environmental changes or other aggravating or alleviating factors except as outlined above   Current Medications, Allergies, Complete Past Medical History, Past Surgical History, Family History, and Social History were reviewed in [employer] record.  ROS  The following are not active complaints unless bolded sore throat, dysphagia, dental problems, itching, sneezing,  nasal congestion or excess/ purulent secretions, ear ache,   fever,  chills, sweats, unintended wt loss, pleuritic or exertional cp, hemoptysis,  orthopnea pnd or leg swelling, presyncope, palpitations, heartburn, abdominal pain, anorexia, nausea, vomiting, diarrhea  or change in bowel or urinary habits, change in stools or urine, dysuria,hematuria,  rash, arthralgias, visual complaints, headache, numbness weakness or ataxia or problems with walking or coordination,  change in mood/affect or memory.           Review of Systems     Objective:   Physical Exam    amb hoarse  wf nad  Wt Readings from Last 3 Encounters:  09/09/14 224 lb 10.4 oz (101.9 kg)  06/17/14 214 lb 15.2 oz (97.5 kg)  03/13/14 204 lb 14.4 oz (92.942 kg)       HEENT: nl dentition, turbinates, and orophanx. Nl external ear canals without cough reflex   NECK :  without JVD/Nodes/TM/ nl carotid upstrokes bilaterally   LUNGS: no acc muscle use, clear to A and P bilaterally without cough on insp or exp maneuvers   CV:  RRR  no s3 or murmur or increase in P2, no edema   ABD:  soft and nontender with nl excursion in the supine position. No bruits or organomegaly, bowel sounds nl  MS:  warm without deformities, calf tenderness, cyanosis or clubbing  SKIN: warm and dry without lesions    NEURO:  alert, approp, no deficits    09/07/14 Shallow inspiration with developing opacities in the right mid lung and left lung base likely representing multifocal pneumonia.  CXR  09/16/2014 :  Improving infiltrates bilaterally. Continued followup is recommended.      Chemistry      Component Value Date/Time   NA 134* 09/08/2014 0320   K 4.5 09/08/2014 0320   CL 96 09/08/2014 0320   CO2 23 09/08/2014 0320   BUN 20 09/08/2014 0320   CREATININE 0.93 09/08/2014 0320   CREATININE 0.90 09/06/2013 0937      Component Value Date/Time   CALCIUM 8.3* 09/08/2014 0320   ALKPHOS 145* 09/07/2014 0436   AST 13 09/07/2014 0436   ALT 12 09/07/2014 0436   BILITOT <0.2* 09/07/2014 0436         Lab Results  Component Value Date   WBC 16.1* 09/08/2014   HGB 9.4* 09/08/2014   HCT 29.4* 09/08/2014   MCV 89.4 09/08/2014   PLT 382 09/08/2014    Lab Results  Component Value Date   TSH 1.340 09/07/2014     Lab Results  Component Value Date   PROBNP 1423.0* 09/07/2014        Assessment:

## 2014-09-26 ENCOUNTER — Other Ambulatory Visit: Payer: Self-pay | Admitting: Internal Medicine

## 2014-10-01 ENCOUNTER — Telehealth: Payer: Self-pay | Admitting: Internal Medicine

## 2014-10-01 NOTE — Telephone Encounter (Signed)
PA form received for the valsartan 160 mg . Form has been placed in MW box to be signed.   PT ID#  [id number] [facility] #  [phone]  Will forward to [facility] to follow up on PA.

## 2014-10-02 NOTE — Telephone Encounter (Signed)
PA form completed and faxed  Will await approval/denial

## 2014-10-07 NOTE — Telephone Encounter (Signed)
Called [facility] to check the status of PA  They are closed  [facility] tomorrow

## 2014-10-14 ENCOUNTER — Ambulatory Visit: Payer: Medicaid Other | Admitting: Cardiovascular Disease

## 2014-10-15 ENCOUNTER — Ambulatory Visit (INDEPENDENT_AMBULATORY_CARE_PROVIDER_SITE_OTHER): Payer: Medicaid Other | Admitting: Internal Medicine

## 2014-10-15 ENCOUNTER — Encounter: Payer: Self-pay | Admitting: Internal Medicine

## 2014-10-15 VITALS — BP 138/88 | HR 118 | Ht 64.0 in | Wt 220.0 lb

## 2014-10-15 DIAGNOSIS — R0609 Other forms of dyspnea: Secondary | ICD-10-CM

## 2014-10-15 DIAGNOSIS — Z720 Tobacco use: Secondary | ICD-10-CM

## 2014-10-15 DIAGNOSIS — F17210 Nicotine dependence, cigarettes, uncomplicated: Secondary | ICD-10-CM

## 2014-10-15 DIAGNOSIS — J449 Chronic obstructive pulmonary disease, unspecified: Secondary | ICD-10-CM

## 2014-10-15 LAB — PULMONARY FUNCTION TEST
DL/VA % PRED: 74 %
DL/VA: 3.59 ml/min/mmHg/L
DLCO unc % pred: 66 %
DLCO unc: 16.22 ml/min/mmHg
FEF 25-75 POST: 1.74 L/s
FEF 25-75 Pre: 1.49 L/sec
FEF2575-%CHANGE-POST: 16 %
FEF2575-%PRED-POST: 65 %
FEF2575-%PRED-PRE: 55 %
FEV1-%Change-Post: 3 %
FEV1-%Pred-Post: 71 %
FEV1-%Pred-Pre: 68 %
FEV1-Post: 1.96 L
FEV1-Pre: 1.89 L
FEV1FVC-%CHANGE-POST: -2 %
FEV1FVC-%PRED-PRE: 93 %
FEV6-%CHANGE-POST: 7 %
FEV6-%Pred-Post: 79 %
FEV6-%Pred-Pre: 74 %
FEV6-PRE: 2.52 L
FEV6-Post: 2.7 L
FEV6FVC-%Change-Post: 0 %
FEV6FVC-%Pred-Post: 103 %
FEV6FVC-%Pred-Pre: 102 %
FVC-%CHANGE-POST: 6 %
FVC-%Pred-Post: 77 %
FVC-%Pred-Pre: 72 %
FVC-Post: 2.7 L
FVC-Pre: 2.53 L
PRE FEV1/FVC RATIO: 75 %
Post FEV1/FVC ratio: 73 %
Post FEV6/FVC ratio: 100 %
Pre FEV6/FVC Ratio: 100 %
RV % pred: 106 %
RV: 1.95 L
TLC % pred: 91 %
TLC: 4.62 L

## 2014-10-15 NOTE — Patient Instructions (Signed)
The key is to stop smoking completely before smoking completely stops you - it's not too late  [place] to use inhalers as needed but you should not need them regularly and if need goes up please return here right away - otherwise follow up here is as needed

## 2014-10-15 NOTE — Assessment & Plan Note (Signed)
>   3 min discussion I reviewed the Flethcher curve with patient that basically indicates  if you quit smoking when your best day FEV1 is still well preserved (as is the case here)  it is highly unlikely you will progress to severe disease and informed the patient there was no medication on the market that has proven to change the curve or the likelihood of progression.  Therefore stopping smoking and maintaining abstinence is the most important aspect of care, not choice of inhalers or for that matter, doctors.   

## 2014-10-15 NOTE — Assessment & Plan Note (Signed)
09/16/2014  Walked RA  2 laps @ 185 ft each stopped due to  No desat,  Nl pace, no desat - 10/15/2014 PFTs s airflow obst/ erv 42 c/w obesity effects   As suspected,most of her symptoms were related to obesity/ not copd, and no maint rx needed  Stop smoking and wt loss key to long term health, pulmonary f/u is as needed

## 2014-10-15 NOTE — Progress Notes (Signed)
PFT done today. 

## 2014-10-15 NOTE — Progress Notes (Signed)
Subjective:     Patient ID: [name], female   DOB: 06/04/1961   MRN: [id number]  HPI   79 yowf  Active Smoker with nl pfts 10/15/2014 referred to pulmonary clinic 09/16/2014 p admit  Admission date: 09/07/2014   Discharge Date: 09/09/2014   Discharge Diagnosis SOB (shortness of breath) [R06.02] Acute bronchitis with chronic obstructive pulmonary disease (COPD) [J44.1] Hyperglycemia [R73.9] Persistent atrial fibrillation [I48.1] DM (diabetes mellitus), type 2 with peripheral vascular complications [id number] COPD with acute exacerbation [J44.1] HCAP (healthcare-associated pneumonia) [J18.9] Acute on chronic diastolic CHF (congestive heart failure), NYHA class 3 [I50.33] Anemia, unspecified anemia type [D64.9] Hypothyroidism, unspecified hypothyroidism type [E03.9]   Principal Problem:  HCAP (healthcare-associated pneumonia) Active Problems:  COPD (chronic obstructive pulmonary disease)  Atrial fibrillation  DM (diabetes mellitus), type 2 with peripheral vascular complications  Long term current use of anticoagulant therapy  Hypothyroidism  S/P peripheral artery angioplasty - TurboHawk atherectomy; R SFA  Acute on chronic diastolic CHF (congestive heart failure), NYHA class 3     09/16/2014 1st [facility] office visit/ Fender Herder  / on ACEi  Chief Complaint  Patient presents with  . HFU    Pt reports her breathing has improved back to baseline since hospital d/c. She still has some cough in the am- white sputum.   chronic doe x across the room  X one year  Not much change between good and bad days /neb helps some Cough is mostly in ams rec Stop lisinopril and replace it with diovan (valsartan) 160 mg daily  The key is to stop smoking completely before smoking completely stops you!  Continue to use the nebulizer as needed up to 4 x daily if needed but you should gradually improve especially if you are not smoking           10/15/2014 f/u ov/Daltin Crist re:  chronic cough/ no evidence copd  Chief Complaint  Patient presents with  . Follow-up    PFT done today. Cough has improved some. No new co's today.  She is using rescue inhaler 1 to 2 times per wk on average.   Not limited by breathing from desired activities      No obvious day to day or daytime variabilty or assoc excess or purulent sputum production or    cp or chest tightness, subjective wheeze overt sinus or hb symptoms. No unusual exp hx or h/o childhood pna/ asthma or knowledge of premature birth.  Sleeping ok without nocturnal  or early am exacerbation  of respiratory  c/o's or need for noct saba. Also denies any obvious fluctuation of symptoms with weather or environmental changes or other aggravating or alleviating factors except as outlined above   Current Medications, Allergies, Complete Past Medical History, Past Surgical History, Family History, and Social History were reviewed in [employer] record.  ROS  The following are not active complaints unless bolded sore throat, dysphagia, dental problems, itching, sneezing,  nasal congestion or excess/ purulent secretions, ear ache,   fever, chills, sweats, unintended wt loss, pleuritic or exertional cp, hemoptysis,  orthopnea pnd or leg swelling, presyncope, palpitations, heartburn, abdominal pain, anorexia, nausea, vomiting, diarrhea  or change in bowel or urinary habits, change in stools or urine, dysuria,hematuria,  rash, arthralgias, visual complaints, headache, numbness weakness or ataxia or problems with walking or coordination,  change in mood/affect or memory.               Objective:   Physical Exam  amb hoarse  wf nad  10/15/2014        220  Wt Readings from Last 3 Encounters:  09/09/14 224 lb 10.4 oz (101.9 kg)  06/17/14 214 lb 15.2 oz (97.5 kg)  03/13/14 204 lb 14.4 oz (92.942 kg)       HEENT: nl dentition, turbinates, and orophanx. Nl external ear canals without cough reflex   NECK  :  without JVD/Nodes/TM/ nl carotid upstrokes bilaterally   LUNGS: no acc muscle use, clear to A and P bilaterally without cough on insp or exp maneuvers   CV:  RRR  no s3 or murmur or increase in P2, no edema   ABD:  soft and nontender with nl excursion in the supine position. No bruits or organomegaly, bowel sounds nl  MS:  warm without deformities, calf tenderness, cyanosis or clubbing  SKIN: warm and dry without lesions    NEURO:  alert, approp, no deficits    09/07/14 Shallow inspiration with developing opacities in the right mid lung and left lung base likely representing multifocal pneumonia.  CXR  09/16/2014 :  Improving infiltrates bilaterally. Continued followup is recommended.      Chemistry      Component Value Date/Time   NA 134* 09/08/2014 0320   K 4.5 09/08/2014 0320   CL 96 09/08/2014 0320   CO2 23 09/08/2014 0320   BUN 20 09/08/2014 0320   CREATININE 0.93 09/08/2014 0320   CREATININE 0.90 09/06/2013 0937      Component Value Date/Time   CALCIUM 8.3* 09/08/2014 0320   ALKPHOS 145* 09/07/2014 0436   AST 13 09/07/2014 0436   ALT 12 09/07/2014 0436   BILITOT <0.2* 09/07/2014 0436        Lab Results  Component Value Date   WBC 16.1* 09/08/2014   HGB 9.4* 09/08/2014   HCT 29.4* 09/08/2014   MCV 89.4 09/08/2014   PLT 382 09/08/2014    Lab Results  Component Value Date   TSH 1.340 09/07/2014     Lab Results  Component Value Date   PROBNP 1423.0* 09/07/2014        Assessment:

## 2014-10-20 ENCOUNTER — Telehealth: Payer: Self-pay | Admitting: Internal Medicine

## 2014-10-20 MED ORDER — DIOVAN 160 MG PO TABS: 160.0000 mg | ORAL_TABLET | Freq: Every day | ORAL | Status: DC

## 2014-10-20 NOTE — Telephone Encounter (Signed)
We tried PA for valstartan Med denied Received notice from [facility] that the Diovan brand name is preferred  Rx has been sent to pharm  ATC the pt and NA, no option to leave msg, [facility]

## 2014-10-23 ENCOUNTER — Encounter (HOSPITAL_COMMUNITY): Payer: Self-pay | Admitting: Cardiovascular Disease

## 2014-10-29 ENCOUNTER — Emergency Department (HOSPITAL_COMMUNITY): Payer: Medicaid Other

## 2014-10-29 ENCOUNTER — Inpatient Hospital Stay (HOSPITAL_COMMUNITY)
Admission: EM | Admit: 2014-10-29 | Discharge: 2014-11-06 | DRG: 286 | Disposition: A | Discharge status: Home / Self Care | Payer: Medicaid Other | Attending: Cardiovascular Disease | Admitting: Cardiovascular Disease

## 2014-10-29 ENCOUNTER — Encounter (HOSPITAL_COMMUNITY): Payer: Self-pay | Admitting: Emergency Medicine

## 2014-10-29 DIAGNOSIS — Z79899 Other long term (current) drug therapy: Secondary | ICD-10-CM | POA: Diagnosis not present

## 2014-10-29 DIAGNOSIS — Z833 Family history of diabetes mellitus: Secondary | ICD-10-CM | POA: Diagnosis not present

## 2014-10-29 DIAGNOSIS — G4730 Sleep apnea, unspecified: Secondary | ICD-10-CM | POA: Diagnosis present

## 2014-10-29 DIAGNOSIS — E669 Obesity, unspecified: Secondary | ICD-10-CM | POA: Diagnosis present

## 2014-10-29 DIAGNOSIS — I4891 Unspecified atrial fibrillation: Secondary | ICD-10-CM | POA: Diagnosis present

## 2014-10-29 DIAGNOSIS — I248 Other forms of acute ischemic heart disease: Principal | ICD-10-CM | POA: Diagnosis present

## 2014-10-29 DIAGNOSIS — D649 Anemia, unspecified: Secondary | ICD-10-CM | POA: Diagnosis present

## 2014-10-29 DIAGNOSIS — I959 Hypotension, unspecified: Secondary | ICD-10-CM | POA: Diagnosis not present

## 2014-10-29 DIAGNOSIS — Z79891 Long term (current) use of opiate analgesic: Secondary | ICD-10-CM

## 2014-10-29 DIAGNOSIS — E785 Hyperlipidemia, unspecified: Secondary | ICD-10-CM | POA: Diagnosis present

## 2014-10-29 DIAGNOSIS — J441 Chronic obstructive pulmonary disease with (acute) exacerbation: Secondary | ICD-10-CM | POA: Diagnosis present

## 2014-10-29 DIAGNOSIS — R000 Tachycardia, unspecified: Secondary | ICD-10-CM | POA: Diagnosis present

## 2014-10-29 DIAGNOSIS — I739 Peripheral vascular disease, unspecified: Secondary | ICD-10-CM | POA: Diagnosis present

## 2014-10-29 DIAGNOSIS — E1165 Type 2 diabetes mellitus with hyperglycemia: Secondary | ICD-10-CM | POA: Diagnosis present

## 2014-10-29 DIAGNOSIS — I5023 Acute on chronic systolic (congestive) heart failure: Secondary | ICD-10-CM | POA: Diagnosis present

## 2014-10-29 DIAGNOSIS — D72829 Elevated white blood cell count, unspecified: Secondary | ICD-10-CM | POA: Diagnosis present

## 2014-10-29 DIAGNOSIS — R079 Chest pain, unspecified: Secondary | ICD-10-CM

## 2014-10-29 DIAGNOSIS — Z7902 Long term (current) use of antithrombotics/antiplatelets: Secondary | ICD-10-CM | POA: Diagnosis not present

## 2014-10-29 DIAGNOSIS — F17210 Nicotine dependence, cigarettes, uncomplicated: Secondary | ICD-10-CM | POA: Diagnosis present

## 2014-10-29 DIAGNOSIS — I429 Cardiomyopathy, unspecified: Secondary | ICD-10-CM | POA: Diagnosis present

## 2014-10-29 DIAGNOSIS — Z794 Long term (current) use of insulin: Secondary | ICD-10-CM | POA: Diagnosis not present

## 2014-10-29 DIAGNOSIS — E1140 Type 2 diabetes mellitus with diabetic neuropathy, unspecified: Secondary | ICD-10-CM | POA: Diagnosis present

## 2014-10-29 DIAGNOSIS — I509 Heart failure, unspecified: Secondary | ICD-10-CM

## 2014-10-29 DIAGNOSIS — I480 Paroxysmal atrial fibrillation: Secondary | ICD-10-CM | POA: Diagnosis present

## 2014-10-29 DIAGNOSIS — N179 Acute kidney failure, unspecified: Secondary | ICD-10-CM | POA: Diagnosis present

## 2014-10-29 DIAGNOSIS — I214 Non-ST elevation (NSTEMI) myocardial infarction: Secondary | ICD-10-CM

## 2014-10-29 DIAGNOSIS — J42 Unspecified chronic bronchitis: Secondary | ICD-10-CM

## 2014-10-29 DIAGNOSIS — Z7982 Long term (current) use of aspirin: Secondary | ICD-10-CM | POA: Diagnosis not present

## 2014-10-29 DIAGNOSIS — E039 Hypothyroidism, unspecified: Secondary | ICD-10-CM | POA: Diagnosis present

## 2014-10-29 DIAGNOSIS — I252 Old myocardial infarction: Secondary | ICD-10-CM | POA: Diagnosis not present

## 2014-10-29 DIAGNOSIS — K7030 Alcoholic cirrhosis of liver without ascites: Secondary | ICD-10-CM | POA: Diagnosis present

## 2014-10-29 DIAGNOSIS — I2489 Other forms of acute ischemic heart disease: Secondary | ICD-10-CM

## 2014-10-29 DIAGNOSIS — I872 Venous insufficiency (chronic) (peripheral): Secondary | ICD-10-CM | POA: Diagnosis present

## 2014-10-29 DIAGNOSIS — R0602 Shortness of breath: Secondary | ICD-10-CM

## 2014-10-29 DIAGNOSIS — K219 Gastro-esophageal reflux disease without esophagitis: Secondary | ICD-10-CM | POA: Diagnosis present

## 2014-10-29 DIAGNOSIS — J449 Chronic obstructive pulmonary disease, unspecified: Secondary | ICD-10-CM | POA: Diagnosis present

## 2014-10-29 DIAGNOSIS — I2510 Atherosclerotic heart disease of native coronary artery without angina pectoris: Secondary | ICD-10-CM | POA: Diagnosis present

## 2014-10-29 DIAGNOSIS — I10 Essential (primary) hypertension: Secondary | ICD-10-CM | POA: Diagnosis present

## 2014-10-29 DIAGNOSIS — R0609 Other forms of dyspnea: Secondary | ICD-10-CM | POA: Diagnosis present

## 2014-10-29 HISTORY — DX: Essential (primary) hypertension: I10

## 2014-10-29 HISTORY — DX: Atherosclerotic heart disease of native coronary artery without angina pectoris: I25.10

## 2014-10-29 HISTORY — DX: Other forms of acute ischemic heart disease: I24.89

## 2014-10-29 LAB — BASIC METABOLIC PANEL
Anion gap: 14 (ref 5–15)
BUN: 23 mg/dL (ref 6–23)
CALCIUM: 9.7 mg/dL (ref 8.4–10.5)
CO2: 27 mEq/L (ref 19–32)
CREATININE: 0.69 mg/dL (ref 0.50–1.10)
Chloride: 97 mEq/L (ref 96–112)
GFR calc non Af Amer: >90 mL/min (ref >90)
Glucose, Bld: 247 mg/dL — ABNORMAL HIGH (ref 70–99)
Potassium: 3.9 mEq/L (ref 3.7–5.3)
Sodium: 138 mEq/L (ref 137–147)

## 2014-10-29 LAB — I-STAT TROPONIN, ED
Troponin i, poc: 0.04 ng/mL (ref 0.00–0.08)
Troponin i, poc: 0.16 ng/mL (ref 0.00–0.08)

## 2014-10-29 LAB — TROPONIN I
TROPONIN I: 2.72 ng/mL — AB (ref <0.30)
Troponin I: 0.83 ng/mL (ref <0.30)
Troponin I: 3.89 ng/mL (ref <0.30)
Troponin I: 3.03 ng/mL (ref <0.30)

## 2014-10-29 LAB — RAPID URINE DRUG SCREEN, HOSP PERFORMED
AMPHETAMINES: NOT DETECTED
BENZODIAZEPINES: NOT DETECTED
Barbiturates: NOT DETECTED
Cocaine: NOT DETECTED
Opiates: POSITIVE — AB
Tetrahydrocannabinol: NOT DETECTED

## 2014-10-29 LAB — GLUCOSE, CAPILLARY
GLUCOSE-CAPILLARY: 295 mg/dL — AB (ref 70–99)
GLUCOSE-CAPILLARY: 294 mg/dL — AB (ref 70–99)
GLUCOSE-CAPILLARY: 404 mg/dL — AB (ref 70–99)

## 2014-10-29 LAB — CBC
HEMATOCRIT: 34.7 % — AB (ref 36.0–46.0)
Hemoglobin: 11.2 g/dL — ABNORMAL LOW (ref 12.0–15.0)
MCH: 28.6 pg (ref 26.0–34.0)
MCHC: 32.3 g/dL (ref 30.0–36.0)
MCV: 88.5 fL (ref 78.0–100.0)
Platelets: 305 10*3/uL (ref 150–400)
RBC: 3.92 MIL/uL (ref 3.87–5.11)
RDW: 13.8 % (ref 11.5–15.5)
WBC: 12.3 10*3/uL — AB (ref 4.0–10.5)

## 2014-10-29 LAB — PROTIME-INR
INR: 1.18 (ref 0.00–1.49)
Prothrombin Time: 15.1 seconds (ref 11.6–15.2)

## 2014-10-29 LAB — HEPARIN LEVEL (UNFRACTIONATED)
HEPARIN UNFRACTIONATED: 0.27 [IU]/mL — AB (ref 0.30–0.70)
Heparin Unfractionated: 0.4 IU/mL (ref 0.30–0.70)

## 2014-10-29 LAB — PRO B NATRIURETIC PEPTIDE: Pro B Natriuretic peptide (BNP): 1070 pg/mL — ABNORMAL HIGH (ref 0–125)

## 2014-10-29 LAB — APTT
aPTT: 35 seconds (ref 24–37)
aPTT: 39 seconds — ABNORMAL HIGH (ref 24–37)

## 2014-10-29 LAB — D-DIMER, QUANTITATIVE (NOT AT ARMC): D DIMER QUANT: 0.48 ug{FEU}/mL (ref 0.00–0.48)

## 2014-10-29 LAB — MRSA PCR SCREENING: MRSA BY PCR: NEGATIVE

## 2014-10-29 MED ORDER — NITROGLYCERIN 0.4 MG SL SUBL: 0.4000 mg | SUBLINGUAL_TABLET | SUBLINGUAL | Status: DC | PRN

## 2014-10-29 MED ORDER — CARVEDILOL 6.25 MG PO TABS
6.2500 mg | ORAL_TABLET | Freq: Two times a day (BID) | ORAL | Status: DC
  Administered 2014-10-29 – 2014-10-31 (×4): 6.25 mg via ORAL
  Filled 2014-10-29 (×4): qty 1

## 2014-10-29 MED ORDER — GLIPIZIDE ER 10 MG PO TB24
30.0000 mg | ORAL_TABLET | Freq: Every day | ORAL | Status: DC
  Filled 2014-10-29 (×3): qty 3

## 2014-10-29 MED ORDER — ASPIRIN 81 MG PO CHEW: 81.0000 mg | CHEWABLE_TABLET | ORAL | Status: DC

## 2014-10-29 MED ORDER — ONDANSETRON HCL 4 MG/2ML IJ SOLN: 4.0000 mg | Freq: Four times a day (QID) | INTRAMUSCULAR | Status: DC | PRN

## 2014-10-29 MED ORDER — FUROSEMIDE 40 MG PO TABS
40.0000 mg | ORAL_TABLET | Freq: Two times a day (BID) | ORAL | Status: DC
  Administered 2014-10-29 – 2014-10-30 (×3): 40 mg via ORAL
  Filled 2014-10-29 (×3): qty 1

## 2014-10-29 MED ORDER — INSULIN GLARGINE 100 UNIT/ML ~~LOC~~ SOLN
35.0000 [IU] | Freq: Every day | SUBCUTANEOUS | Status: DC
  Administered 2014-10-29 – 2014-10-30 (×2): 35 [IU] via SUBCUTANEOUS
  Filled 2014-10-29 (×2): qty 0.35

## 2014-10-29 MED ORDER — ISOSORBIDE MONONITRATE ER 30 MG PO TB24
30.0000 mg | ORAL_TABLET | Freq: Every day | ORAL | Status: DC
  Administered 2014-10-29 – 2014-10-30 (×2): 30 mg via ORAL
  Filled 2014-10-29 (×2): qty 1

## 2014-10-29 MED ORDER — SODIUM CHLORIDE 0.9 % IJ SOLN: 3.0000 mL | INTRAMUSCULAR | Status: DC | PRN

## 2014-10-29 MED ORDER — ATORVASTATIN CALCIUM 40 MG PO TABS
40.0000 mg | ORAL_TABLET | Freq: Every day | ORAL | Status: DC
  Administered 2014-10-29 – 2014-10-30 (×2): 40 mg via ORAL
  Filled 2014-10-29 (×2): qty 1

## 2014-10-29 MED ORDER — ASPIRIN 81 MG PO CHEW
324.0000 mg | CHEWABLE_TABLET | Freq: Once | ORAL | Status: DC
  Filled 2014-10-29: qty 4

## 2014-10-29 MED ORDER — MORPHINE SULFATE 4 MG/ML IJ SOLN
4.0000 mg | INTRAMUSCULAR | Status: AC | PRN
  Administered 2014-10-29 – 2014-10-30 (×3): 4 mg via INTRAVENOUS
  Filled 2014-10-29 (×3): qty 1

## 2014-10-29 MED ORDER — INSULIN ASPART 100 UNIT/ML ~~LOC~~ SOLN: 0.0000 [IU] | Freq: Three times a day (TID) | SUBCUTANEOUS | Status: DC

## 2014-10-29 MED ORDER — HEPARIN (PORCINE) IN NACL 100-0.45 UNIT/ML-% IJ SOLN
1000.0000 [IU]/h | Freq: Once | INTRAMUSCULAR | Status: AC
  Administered 2014-10-29: 1000 [IU]/h via INTRAVENOUS
  Filled 2014-10-29: qty 250

## 2014-10-29 MED ORDER — SODIUM CHLORIDE 0.9 % IV SOLN
INTRAVENOUS | Status: DC
  Administered 2014-10-30 – 2014-10-31 (×3): via INTRAVENOUS

## 2014-10-29 MED ORDER — HEPARIN BOLUS VIA INFUSION
2000.0000 [IU] | Freq: Once | INTRAVENOUS | Status: AC
  Administered 2014-10-29: 2000 [IU] via INTRAVENOUS
  Filled 2014-10-29: qty 2000

## 2014-10-29 MED ORDER — SILVER SULFADIAZINE 1 % EX CREA
1.0000 "application " | TOPICAL_CREAM | Freq: Every day | CUTANEOUS | Status: DC | PRN
  Filled 2014-10-29: qty 85

## 2014-10-29 MED ORDER — ASPIRIN EC 81 MG PO TBEC
81.0000 mg | DELAYED_RELEASE_TABLET | Freq: Every day | ORAL | Status: DC
  Administered 2014-10-30: 81 mg via ORAL
  Filled 2014-10-29: qty 1

## 2014-10-29 MED ORDER — INSULIN ASPART 100 UNIT/ML ~~LOC~~ SOLN
0.0000 [IU] | Freq: Three times a day (TID) | SUBCUTANEOUS | Status: DC
  Administered 2014-10-29 – 2014-10-30 (×3): 5 [IU] via SUBCUTANEOUS
  Administered 2014-10-30: 7 [IU] via SUBCUTANEOUS
  Administered 2014-10-30: 3 [IU] via SUBCUTANEOUS
  Administered 2014-10-31: 5 [IU] via SUBCUTANEOUS
  Administered 2014-10-31: 7 [IU] via SUBCUTANEOUS
  Administered 2014-11-01: 3 [IU] via SUBCUTANEOUS
  Administered 2014-11-01: 5 [IU] via SUBCUTANEOUS
  Administered 2014-11-01: 9 [IU] via SUBCUTANEOUS
  Administered 2014-11-02: 2 [IU] via SUBCUTANEOUS
  Administered 2014-11-02 (×2): 7 [IU] via SUBCUTANEOUS
  Administered 2014-11-03: 5 [IU] via SUBCUTANEOUS
  Administered 2014-11-03: 9 [IU] via SUBCUTANEOUS
  Administered 2014-11-03 – 2014-11-04 (×3): 3 [IU] via SUBCUTANEOUS
  Administered 2014-11-04: 5 [IU] via SUBCUTANEOUS
  Administered 2014-11-05: 1 [IU] via SUBCUTANEOUS
  Administered 2014-11-05 (×2): 3 [IU] via SUBCUTANEOUS
  Administered 2014-11-06: 5 [IU] via SUBCUTANEOUS

## 2014-10-29 MED ORDER — IPRATROPIUM-ALBUTEROL 0.5-2.5 (3) MG/3ML IN SOLN: 3.0000 mL | Freq: Four times a day (QID) | RESPIRATORY_TRACT | Status: DC | PRN

## 2014-10-29 MED ORDER — ACETAMINOPHEN 325 MG PO TABS
650.0000 mg | ORAL_TABLET | ORAL | Status: DC | PRN
Start: 2014-10-29 — End: 2014-10-30

## 2014-10-29 MED ORDER — SODIUM CHLORIDE 0.9 % IV SOLN: 250.0000 mL | INTRAVENOUS | Status: DC | PRN

## 2014-10-29 MED ORDER — IRBESARTAN 75 MG PO TABS
75.0000 mg | ORAL_TABLET | Freq: Every day | ORAL | Status: DC
  Administered 2014-10-29 – 2014-10-30 (×2): 75 mg via ORAL
  Filled 2014-10-29 (×2): qty 1

## 2014-10-29 MED ORDER — CLOPIDOGREL BISULFATE 75 MG PO TABS
75.0000 mg | ORAL_TABLET | Freq: Every day | ORAL | Status: DC
  Administered 2014-10-30 – 2014-10-31 (×2): 75 mg via ORAL
  Filled 2014-10-29 (×2): qty 1

## 2014-10-29 MED ORDER — LEVOTHYROXINE SODIUM 50 MCG PO TABS
50.0000 ug | ORAL_TABLET | Freq: Every day | ORAL | Status: DC
  Administered 2014-10-30 – 2014-10-31 (×2): 50 ug via ORAL
  Filled 2014-10-29 (×2): qty 1

## 2014-10-29 MED ORDER — ASPIRIN 300 MG RE SUPP: 300.0000 mg | RECTAL | Status: DC

## 2014-10-29 MED ORDER — ADULT MULTIVITAMIN W/MINERALS CH
1.0000 | ORAL_TABLET | Freq: Every day | ORAL | Status: DC
  Administered 2014-10-29 – 2014-11-06 (×9): 1 via ORAL
  Filled 2014-10-29 (×9): qty 1

## 2014-10-29 MED ORDER — GI COCKTAIL ~~LOC~~
30.0000 mL | Freq: Once | ORAL | Status: AC
  Administered 2014-10-29: 30 mL via ORAL
  Filled 2014-10-29: qty 30

## 2014-10-29 MED ORDER — INSULIN ASPART 100 UNIT/ML ~~LOC~~ SOLN
5.0000 [IU] | Freq: Once | SUBCUTANEOUS | Status: AC
  Administered 2014-10-29: 5 [IU] via SUBCUTANEOUS

## 2014-10-29 MED ORDER — ASPIRIN 81 MG PO CHEW
324.0000 mg | CHEWABLE_TABLET | Freq: Once | ORAL | Status: AC
  Administered 2014-10-29: 324 mg via ORAL

## 2014-10-29 MED ORDER — METOPROLOL TARTRATE 25 MG PO TABS
25.0000 mg | ORAL_TABLET | Freq: Once | ORAL | Status: AC
  Administered 2014-10-29: 25 mg via ORAL
  Filled 2014-10-29: qty 1

## 2014-10-29 MED ORDER — MULTI-VITAMIN/MINERALS PO TABS: 1.0000 | ORAL_TABLET | Freq: Every day | ORAL | Status: DC

## 2014-10-29 MED ORDER — SODIUM CHLORIDE 0.9 % IJ SOLN
3.0000 mL | Freq: Two times a day (BID) | INTRAMUSCULAR | Status: DC
  Administered 2014-10-29 – 2014-10-30 (×2): 3 mL via INTRAVENOUS

## 2014-10-29 MED ORDER — ZOLPIDEM TARTRATE 5 MG PO TABS
5.0000 mg | ORAL_TABLET | Freq: Once | ORAL | Status: AC
  Administered 2014-10-29: 5 mg via ORAL
  Filled 2014-10-29: qty 1

## 2014-10-29 MED ORDER — ALBUTEROL SULFATE (2.5 MG/3ML) 0.083% IN NEBU: 2.5000 mg | INHALATION_SOLUTION | RESPIRATORY_TRACT | Status: DC | PRN

## 2014-10-29 MED ORDER — GI COCKTAIL ~~LOC~~: 30.0000 mL | Freq: Once | ORAL | Status: DC

## 2014-10-29 MED ORDER — LEVOFLOXACIN 750 MG PO TABS
750.0000 mg | ORAL_TABLET | Freq: Every day | ORAL | Status: DC
  Administered 2014-10-29: 750 mg via ORAL
  Filled 2014-10-29: qty 1

## 2014-10-29 MED ORDER — ONDANSETRON HCL 4 MG/2ML IJ SOLN
4.0000 mg | Freq: Once | INTRAMUSCULAR | Status: AC
  Administered 2014-10-29: 4 mg via INTRAVENOUS
  Filled 2014-10-29: qty 2

## 2014-10-29 MED ORDER — ASPIRIN EC 81 MG PO TBEC: 81.0000 mg | DELAYED_RELEASE_TABLET | Freq: Every day | ORAL | Status: DC

## 2014-10-29 MED ORDER — OXYCODONE HCL 5 MG PO TABS
5.0000 mg | ORAL_TABLET | Freq: Three times a day (TID) | ORAL | Status: DC | PRN
  Administered 2014-10-29 – 2014-10-30 (×3): 5 mg via ORAL
  Filled 2014-10-29 (×5): qty 1

## 2014-10-29 MED ORDER — HEPARIN SODIUM (PORCINE) 5000 UNIT/ML IJ SOLN: 4000.0000 [IU] | Freq: Once | INTRAMUSCULAR | Status: DC

## 2014-10-29 MED ORDER — HEPARIN (PORCINE) IN NACL 100-0.45 UNIT/ML-% IJ SOLN
1800.0000 [IU]/h | INTRAMUSCULAR | Status: DC
  Administered 2014-10-29: 1000 [IU]/h via INTRAVENOUS
  Administered 2014-10-30 (×2): 1600 [IU]/h via INTRAVENOUS
  Administered 2014-10-31: 1800 [IU]/h via INTRAVENOUS
  Filled 2014-10-29 (×4): qty 250

## 2014-10-29 MED ORDER — ASPIRIN 81 MG PO CHEW: 324.0000 mg | CHEWABLE_TABLET | ORAL | Status: DC

## 2014-10-29 MED ORDER — ALBUTEROL SULFATE HFA 108 (90 BASE) MCG/ACT IN AERS: 1.0000 | INHALATION_SPRAY | RESPIRATORY_TRACT | Status: DC | PRN

## 2014-10-29 MED ORDER — GABAPENTIN 300 MG PO CAPS
300.0000 mg | ORAL_CAPSULE | Freq: Two times a day (BID) | ORAL | Status: DC
  Administered 2014-10-29 – 2014-10-30 (×4): 300 mg via ORAL
  Filled 2014-10-29 (×4): qty 1

## 2014-10-29 MED ORDER — IRBESARTAN 75 MG PO TABS: 75.0000 mg | ORAL_TABLET | Freq: Every day | ORAL | Status: DC

## 2014-10-29 NOTE — ED Notes (Signed)
Dr. Nahser at bedside 

## 2014-10-29 NOTE — Progress Notes (Signed)
ANTICOAGULATION CONSULT NOTE  Pharmacy Consult for heparin  Indication: chest pain/ACS  No Known Allergies  Patient Measurements: Height: 5\' 4"  (162.6 cm) Weight: 220 lb 9.6 oz (100.064 kg) IBW/kg (Calculated) : 54.7 Heparin Dosing Weight: 78kg  Vital Signs: Temp: 98.4 F (36.9 C) (12/16 1300) Temp Source: Oral (12/16 1300) BP: 143/75 mmHg (12/16 2102) Pulse Rate: 106 (12/16 2102)  Labs:  Recent Labs  10/29/14 0222 10/29/14 0330 10/29/14 0507 10/29/14 1145 10/29/14 1330 10/29/14 1336 10/29/14 1700 10/29/14 2100  HGB 11.2*  --   --   --   --   --   --   --   HCT 34.7*  --   --   --   --   --   --   --   PLT 305  --   --   --   --   --   --   --   APTT  --   --   --   --  35  --   --  39*  LABPROT  --  15.1  --   --   --   --   --   --   INR  --  1.18  --   --   --   --   --   --   HEPARINUNFRC  --   --   --   --   --  0.40  --  0.27*  CREATININE 0.69  --   --   --   --   --   --   --   TROPONINI  --   --  0.83* 3.89*  --   --  3.03*  --     Estimated Creatinine Clearance: 94.7 mL/min (by C-G formula based on Cr of 0.69).  Assessment 53 YOF started on heparin for ACS. PTA she is on Xarelto- last documented dose 12/15 PM.   PM Heparin level and APTT are sub-therapeutic. Starting to line up but will use APTT for now.   No issues with infusion or bleeding per RN.   Goal of Therapy:  Heparin level 0.3-0.7 units/ml aPTT 66-102 seconds Monitor platelets by anticoagulation protocol: Yes   Plan:  Heparin bolus 2000 units. Increase heparin to 1600 units/hr- about a 4unit/kg/hr rate increase HL and aPTT with AM labs Daily aPTT, HL and CBC while on protocol Cath planned for tomorrow- follow for any changes in plans Follow for s/s bleeding  Sloan Leiter, PharmD, BCPS Clinical Pharmacist [phone]  10/29/2014 10:24 PM

## 2014-10-29 NOTE — ED Notes (Signed)
Called Re[facility] in main lab, d-dimer has been sent, and to please add on INR. She acknowledges and will process.

## 2014-10-29 NOTE — ED Notes (Signed)
Ordered breakfast tray for pt and gave her crackers

## 2014-10-29 NOTE — H&P (Signed)
ADMISSION HISTORY AND PHYSICAL   Date: 10/29/2014               Patient Name:  [name] MRN: [id number]  DOB: July 08, 1961 Age / Sex: 53 y.o., female        PCP: Angelica Chessman Primary Cardiologist: Johnsie Cancel         History of Present Illness: Patient is a 53 y.o. female with a PMHx of paroxysmal atrial fib,  DM, COPD  , who was admitted to [facility] on 10/29/2014 for evaluation of CP and NSTEMI  Pt describes CP / like a brick on the back of her neck. Radiates down arms, associated with dyspnea.   Has had similar episodes assoicated with elevated BP   She ate 2 hot dogs last night, developed indigestion.  Severe shoulder pain.  Location: upper chest and neck Quality: heaviness Duration: lasted several hours -until she arrived at the hospital and received NTG Timing:  Started while she was sitting , talking to her sister   Smokes - 1 ppd ETOH - none Fhx:  Mother - DM  and father- heart problems.      Hx of PVD - stent to her SFA by Dr. Adora Fridge in 2014  Medications: Outpatient medications:  (Not in a hospital admission)  No Known Allergies   Past Medical History  Diagnosis Date  . COPD (chronic obstructive pulmonary disease)   . Alcohol abuse   . Narcotic abuse   . Cocaine abuse   . Marijuana abuse   . Poorly controlled diabetes mellitus   . COPD (chronic obstructive pulmonary disease)   . Tobacco abuse   . Heart failure   . Alcoholic cirrhosis   . Cardiomyopathy   . Peripheral arterial disease   . Obesity   . CHF (congestive heart failure)   . Hypothyroidism   . GERD (gastroesophageal reflux disease)   . Headache(784.0)     "@ least once or twice/wk" (09/10/2013)  . Arthritis     "hands, arms, shoulders, legs, back" (09/10/2013)  . Atrial fibrillation   . Critical lower limb ischemia   . Peripheral arterial disease     Past Surgical History  Procedure Laterality Date  . Lower extremity angiogram Right 09/10/2013    turbo hawk directional  arthrectomy of a diffusely diseased SFA/notes 09/10/2013   . Cardioversion  ~ 02/2013    "twice" (09/10/2013)  . Lower extremity angiogram N/A 09/10/2013    Procedure: LOWER EXTREMITY ANGIOGRAM;  Surgeon: [name], MD;  Location: [facility] CATH LAB;  Service: Cardiovascular;  Laterality: N/A;    Family History  Problem Relation Age of Onset  . Hypertension Mother   . Diabetes Mother   . Cancer Mother     breast, ovarian, colon  . Hypertension Father   . Emphysema Sister     smoked  . Heart disease Father   . Clotting disorder Mother     Social History:  reports that she has been smoking Cigarettes.  She has a 38 pack-year smoking history. She has never used smokeless tobacco. She reports that she drinks about 0.6 oz of alcohol per week. She reports that she uses illicit drugs ("Crack" cocaine and Marijuana).   Review of Systems: Constitutional:  denies fever, chills, diaphoresis, appetite change and fatigue.  HEENT: denies photophobia, eye pain, redness, hearing loss, ear pain, congestion, sore throat, rhinorrhea, sneezing, neck pain, neck stiffness and tinnitus.  Respiratory: admits to SOB,    Cardiovascular: admits  to chest pain, palpitations and  + leg swelling especially right leg  Gastrointestinal: admits to nausea, if she takes meds before eating   Genitourinary: denies dysuria, urgency, frequency, hematuria, flank pain and difficulty urinating.  Musculoskeletal: denies  myalgias, back pain, joint swelling, arthralgias and gait problem.   Skin: denies pallor, rash . Has a nonhealing ulcer on left lower leg.   Neurological: denies dizziness, seizures, syncope, weakness, light-headedness, numbness and headaches.   Hematological: denies adenopathy, easy bruising, personal or family bleeding history.  Psychiatric/ Behavioral: denies suicidal ideation, mood changes, confusion, nervousness, sleep disturbance and agitation.    Physical Exam: BP 137/97 mmHg  Pulse 106   Temp(Src) 98 F (36.7 C) (Oral)  Resp 31  Ht 5\' 4"  (1.626 m)  Wt 220 lb (99.791 kg)  BMI 37.74 kg/m2  SpO2 98%  LMP 11/27/2012  Wt Readings from Last 3 Encounters:  10/29/14 220 lb (99.791 kg)  10/15/14 220 lb (99.791 kg)  09/16/14 221 lb 12.8 oz (100.608 kg)    General: Vital signs reviewed and noted. Well-developed, well-nourished, in no acute distress; alert,   Head: Normocephalic, atraumatic, sclera anicteric, mucus membranes are moist   Neck: Supple. Negative for carotid bruits. JVD not elevated.   Lungs:  Wheezing bilateralluy  Heart: RRR with S1 S2. No murmurs, rubs, or gallops.   Abdomen:  Soft, non-tender, non-distended with normoactive bowel sounds. No hepatomegaly. No rebound/guarding. No obvious abdominal masses   MSK: Strength and the appear normal for age.  Poorly healing ulcer on left lower leg   Extremities: No clubbing or cyanosis. 1-2 +  Edema right leg, not much in left leg.  Distal pedal pulses are 2+ and equal bilaterally .  Neurologic: Alert and oriented X 3. Moves all extremities spontaneously   Psych:  normal     Lab results: Basic Metabolic Panel:  Recent Labs Lab 10/29/14 0222  NA 138  K 3.9  CL 97  CO2 27  GLUCOSE 247*  BUN 23  CREATININE 0.69  CALCIUM 9.7    Liver Function Tests: No results for input(s): AST, ALT, ALKPHOS, BILITOT, PROT, ALBUMIN in the last 168 hours. No results for input(s): LIPASE, AMYLASE in the last 168 hours.  CBC:  Recent Labs Lab 10/29/14 0222  WBC 12.3*  HGB 11.2*  HCT 34.7*  MCV 88.5  PLT 305    Cardiac Enzymes:  Recent Labs Lab 10/29/14 0507  TROPONINI 0.83*    BNP: Invalid input(s): POCBNP  CBG: No results for input(s): GLUCAP in the last 168 hours.  Coagulation Studies:  Recent Labs  10/29/14 0330  LABPROT 15.1  INR 1.18      ECG :  . Sinus tach.  No ST or T wave changes   Imaging: Dg Chest 2 View  10/29/2014   CLINICAL DATA:  Initial evaluation for are chest pain.  EXAM:  CHEST  2 VIEW  COMPARISON:  Prior study from 10/13/2014  FINDINGS: Cardiac and mediastinal silhouettes are stable in size and contour, and remain within normal limits.  Lungs are mildly hypoinflated. There is mild diffuse prominence of the interstitial markings, similar to prior, which may related to history of smoking. No consolidative airspace disease. No pulmonary edema or pleural effusion. No pneumothorax.  No acute osseous abnormality. Accentuation of the normal thoracic kyphosis noted.  IMPRESSION: No active cardiopulmonary disease.   Electronically Signed   By: Jeannine Boga M.D.   On: 10/29/2014 04:14       Assessment & Plan:  1. CAD / NSTEMI: Pt presents with CP for several hours.  Now has + Troponin levels.  Pain free after receiving NTG here in the ER  Will schedule her for cath tomorrow with Dr. Tamala Julian.    IV heparin. IMdur for now - will use IV nitro if needed.   2. Paroxysmal atrial fib:  Is in sinus tach currently. contineu coreg for rate control Holding xarelto for cath tomorrow. .  3. COPD : I advised her to stop smoking Will get cessation consult  4. PVD :  Stable for now.   DVT PPX - iv heparin,    Thayer Headings, Brooke Bonito., MD, [facility] 10/29/2014, 7:34 AM

## 2014-10-29 NOTE — ED Notes (Signed)
Oxygen 2 liter via [place] applied.  Sats drop when patient falls asleep

## 2014-10-29 NOTE — ED Notes (Signed)
Pt. reports mid chest pain radiating to both arms  with SOB onset this evening , denies nausea or diaphoresis .

## 2014-10-29 NOTE — ED Provider Notes (Signed)
CSN: [id number]     Arrival date & time 10/29/14  0133 History  This chart was scribed for Tanna Furry, MD by Delphia Grates, ED Scribe. This patient was seen in room D34C/D34C and the patient's care was started at 1:53 AM.   Chief Complaint  Patient presents with  . Chest Pain    The history is provided by the patient. No language interpreter was used.     HPI Comments: [name] is a 53 y.o. female, with history of COPD, CHF, Paroxysmal AF and DM, who presents to the Emergency Department complaining of central  pain that began approximately 1 hour ago. Patient reports the pain radiates to her bilateral arms stopping at the elbow. Patient states she was sitting down, watching TV, and denies any physical exertion prior to onset of pain. Describes it as "like a brick on the back of my neck". States it radiates down her arms. There is associated SOB and posterior neck pain. She notes bilateral leg swelling at baseline, and is currently on Lasix, but states this is not helping. She also notes generalzied abdominal pain and states she proceeded to have a BM and this helped her abdomen, however, she reports the neck arm and chest pain pain still persists. Patient states all her symptoms began simultaneously. No aggravation or alleviating factors. She reports history of same and states the previous episodes was due to high blood pressure. Patient is now currently on medication for HTN and denies changes in medications since she was last seen.  Patient reports she was recently evaluated by her pulmonologist, October 15, 2014 where she received a pulmonary function test which revealed unremarkable findings. Patient also reports history of anxiety but is currently not taking any medications for this. She denies cough, fever, nausea, or vomiting. Patient is a current smoker, but denies any illicit drug use or EtOH consumption.   Patient with a recent admission with paroxysmal A. fib and RVR and  CHF.    Past Medical History  Diagnosis Date  . COPD (chronic obstructive pulmonary disease)   . Alcohol abuse   . Narcotic abuse   . Cocaine abuse   . Marijuana abuse   . Poorly controlled diabetes mellitus   . COPD (chronic obstructive pulmonary disease)   . Tobacco abuse   . Heart failure   . Alcoholic cirrhosis   . Cardiomyopathy   . Peripheral arterial disease   . Obesity   . CHF (congestive heart failure)   . Hypothyroidism   . GERD (gastroesophageal reflux disease)   . Headache(784.0)     "@ least once or twice/wk" (09/10/2013)  . Arthritis     "hands, arms, shoulders, legs, back" (09/10/2013)  . Atrial fibrillation   . Critical lower limb ischemia   . Peripheral arterial disease    Past Surgical History  Procedure Laterality Date  . Lower extremity angiogram Right 09/10/2013    turbo hawk directional arthrectomy of a diffusely diseased SFA/notes 09/10/2013   . Cardioversion  ~ 02/2013    "twice" (09/10/2013)  . Lower extremity angiogram N/A 09/10/2013    Procedure: LOWER EXTREMITY ANGIOGRAM;  Surgeon: [name], MD;  Location: [facility] CATH LAB;  Service: Cardiovascular;  Laterality: N/A;   Family History  Problem Relation Age of Onset  . Hypertension Mother   . Diabetes Mother   . Cancer Mother     breast, ovarian, colon  . Hypertension Father   . Emphysema Sister     smoked  .  Heart disease Father   . Clotting disorder Mother    History  Substance Use Topics  . Smoking status: Current Every Day Smoker -- 1.00 packs/day for 38 years    Types: Cigarettes  . Smokeless tobacco: Never Used  . Alcohol Use: 0.6 oz/week    1 Cans of beer per week     Comment: 09/10/2013 "might drink 1 beer/wk; never had problem w/it"   OB History    Gravida Para Term Preterm AB TAB SAB Ectopic Multiple Living   1 1 1       1      Review of Systems  Constitutional: Negative for fever, chills, diaphoresis, appetite change and fatigue.  HENT: Negative for mouth sores,  sore throat and trouble swallowing.   Eyes: Negative for visual disturbance.  Respiratory: Positive for shortness of breath. Negative for cough, chest tightness and wheezing.   Cardiovascular: Positive for chest pain and leg swelling (bilateral; baseline).  Gastrointestinal: Positive for abdominal pain. Negative for nausea, vomiting, diarrhea and abdominal distention.  Endocrine: Negative for polydipsia, polyphagia and polyuria.  Genitourinary: Negative for dysuria, frequency and hematuria.  Musculoskeletal: Positive for myalgias (bilateral arms) and neck pain. Negative for gait problem.  Skin: Negative for color change, pallor and rash.  Neurological: Negative for dizziness, syncope, light-headedness and headaches.  Hematological: Does not bruise/bleed easily.  Psychiatric/Behavioral: Negative for behavioral problems and confusion.      Allergies  Review of patient's allergies indicates no known allergies.  Home Medications   Prior to Admission medications   Medication Sig Start Date End Date Taking? Authorizing Provider  ACCU-CHEK SOFTCLIX LANCETS lancets 1 each by Other route See admin instructions. Check blood sugar twice daily.   Yes Historical Provider, MD  albuterol (PROVENTIL HFA;VENTOLIN HFA) 108 (90 BASE) MCG/ACT inhaler Inhale 1-2 puffs into the lungs every 4 (four) hours as needed for wheezing or shortness of breath. 09/09/14  Yes Thurnell Lose, MD  aspirin EC 81 MG tablet Take 1 tablet (81 mg total) by mouth daily. 07/31/13  Yes Renella Cunas, MD  carvedilol (COREG) 6.25 MG tablet Take 1 tablet (6.25 mg total) by mouth 2 (two) times daily with a meal. 06/18/14  Yes Jonetta Osgood, MD  clopidogrel (PLAVIX) 75 MG tablet Take 1 tablet (75 mg total) by mouth daily with breakfast. 06/18/14  Yes Shanker Kristeen Mans, MD  DIOVAN 160 MG tablet Take 1 tablet (160 mg total) by mouth daily. 10/20/14  Yes [name], MD  furosemide (LASIX) 40 MG tablet Take 1 tablet (40 mg total) by  mouth 2 (two) times daily. 09/09/14  Yes Thurnell Lose, MD  gabapentin (NEURONTIN) 300 MG capsule Take 1 capsule (300 mg total) by mouth 2 (two) times daily. 06/18/14  Yes Shanker Kristeen Mans, MD  glipiZIDE (GLUCOTROL XL) 10 MG 24 hr tablet Take 30 mg by mouth daily with breakfast.   Yes Historical Provider, MD  glucose blood (ACCU-CHEK AVIVA PLUS) test strip 1 each by Other route See admin instructions. Check blood sugar twice daily.   Yes Historical Provider, MD  insulin glargine (LANTUS) 100 UNIT/ML injection Inject 35 Units into the skin at bedtime.   Yes Historical Provider, MD  insulin NPH-regular Human (NOVOLIN 70/30) (70-30) 100 UNIT/ML injection Inject 4-10 Units into the skin 3 (three) times daily. Per sliding scale.   Yes Historical Provider, MD  ipratropium-albuterol (DUONEB) 0.5-2.5 (3) MG/3ML SOLN Take 3 mLs by nebulization every 6 (six) hours as needed (for shortness of  breath). 09/09/14  Yes Thurnell Lose, MD  isosorbide mononitrate (IMDUR) 30 MG 24 hr tablet Take 1 tablet (30 mg total) by mouth daily. 09/09/14  Yes Thurnell Lose, MD  levothyroxine (SYNTHROID, LEVOTHROID) 50 MCG tablet Take 1 tablet (50 mcg total) by mouth daily. 06/18/14  Yes Shanker Kristeen Mans, MD  metFORMIN (GLUCOPHAGE) 1000 MG tablet Take 1,000 mg by mouth 2 (two) times daily with a meal.   Yes Historical Provider, MD  Multiple Vitamins-Minerals (MULTIVITAMIN WITH MINERALS) tablet Take 1 tablet by mouth daily.   Yes Historical Provider, MD  oxyCODONE (OXY IR/ROXICODONE) 5 MG immediate release tablet Take 5 mg by mouth every 8 (eight) hours as needed for moderate pain.    Yes Historical Provider, MD  rivaroxaban (XARELTO) 20 MG TABS tablet Take 1 tablet (20 mg total) by mouth every morning. 06/18/14  Yes Shanker Kristeen Mans, MD  silver sulfADIAZINE (SILVADENE) 1 % cream Apply 1 application topically daily as needed (rash spots on legs).   Yes Historical Provider, MD  levofloxacin (LEVAQUIN) 750 MG tablet Take 1 tablet  (750 mg total) by mouth daily. Patient not taking: Reported on 10/29/2014 09/09/14   Thurnell Lose, MD  valsartan (DIOVAN) 160 MG tablet Take 1 tablet (160 mg total) by mouth daily. Patient not taking: Reported on 10/29/2014 09/16/14   [name], MD   Triage Vitals: BP 171/103 mmHg  Pulse 128  Temp(Src) 98.1 F (36.7 C) (Oral)  Resp 20  Ht 5\' 4"  (1.626 m)  Wt 220 lb (99.791 kg)  BMI 37.74 kg/m2  SpO2 96%  LMP 11/27/2012  Physical Exam  Constitutional: She is oriented to person, place, and time. She appears well-developed and well-nourished. No distress.  HENT:  Head: Normocephalic.  Eyes: Conjunctivae are normal. Pupils are equal, round, and reactive to light. No scleral icterus.  Neck: Normal range of motion. Neck supple. No thyromegaly present.  TTP across the neck and shoulders. Patient states reproduces her symptoms.  Cardiovascular: Normal rate and regular rhythm.  Exam reveals no gallop and no friction rub.   No murmur heard. Pulmonary/Chest: Effort normal and breath sounds normal. No respiratory distress. She has no wheezes. She has no rales.  Lungs are clear.  Abdominal: Soft. Bowel sounds are normal. She exhibits no distension. There is no tenderness. There is no rebound.  Musculoskeletal: Normal range of motion. She exhibits edema.  1+ symmetric lower extremity edema. Normal per patient.  Neurological: She is alert and oriented to person, place, and time.  Skin: Skin is warm and dry. No rash noted.  Psychiatric: She has a normal mood and affect. Her behavior is normal.  Nursing note and vitals reviewed.   ED Course  Procedures (including critical care time)  DIAGNOSTIC STUDIES: Oxygen Saturation is 96% on room air, adequate by my interpretation.    COORDINATION OF CARE: At 0158 Discussed treatment plan with patient which includes pain medication. Patient agrees.   Labs Review Labs Reviewed  CBC - Abnormal; Notable for the following:    WBC 12.3 (*)     Hemoglobin 11.2 (*)    HCT 34.7 (*)    All other components within normal limits  BASIC METABOLIC PANEL - Abnormal; Notable for the following:    Glucose, Bld 247 (*)    All other components within normal limits  PRO B NATRIURETIC PEPTIDE - Abnormal; Notable for the following:    Pro B Natriuretic peptide (BNP) 1070.0 (*)    All other components within  normal limits  URINE RAPID DRUG SCREEN (HOSP PERFORMED) - Abnormal; Notable for the following:    Opiates POSITIVE (*)    All other components within normal limits  TROPONIN I - Abnormal; Notable for the following:    Troponin I 0.83 (*)    All other components within normal limits  I-STAT TROPOININ, ED - Abnormal; Notable for the following:    Troponin i, poc 0.16 (*)    All other components within normal limits  D-DIMER, QUANTITATIVE  PROTIME-INR  I-STAT TROPOININ, ED    Imaging Review Dg Chest 2 View  10/29/2014   CLINICAL DATA:  Initial evaluation for are chest pain.  EXAM: CHEST  2 VIEW  COMPARISON:  Prior study from 10/13/2014  FINDINGS: Cardiac and mediastinal silhouettes are stable in size and contour, and remain within normal limits.  Lungs are mildly hypoinflated. There is mild diffuse prominence of the interstitial markings, similar to prior, which may related to history of smoking. No consolidative airspace disease. No pulmonary edema or pleural effusion. No pneumothorax.  No acute osseous abnormality. Accentuation of the normal thoracic kyphosis noted.  IMPRESSION: No active cardiopulmonary disease.   Electronically Signed   By: Jeannine Boga M.D.   On: 10/29/2014 04:14     EKG Interpretation   Date/Time:  Wednesday October 29 2014 01:39:09 EST Ventricular Rate:  128 PR Interval:  130 QRS Duration: 84 QT Interval:  324 QTC Calculation: 473 R Axis:   24 Text Interpretation:  Sinus tachycardia Nonspecific T wave abnormality  Abnormal ECG Confirmed by Jeneen Rinks  MD, [place] ([id number]) on 10/29/2014 1:52:48 AM       MDM   Final diagnoses:  Chest pain  SOB (shortness of breath)    Pulmonary initial evaluation patient's only complaint was posterior neck pain. The pain in her chest had resolved. She was tachycardic at 110-120 sinus tach on EKG and the monitor. No episodes of A. Fib.  She'll troponin 0.04. Second troponin elevate's. Confirmation lab troponin 0.83. Negative d-dimer. Normal chest x-ray. Given morphine. Her symptoms have resolved. She had a recurrence of symptoms described as "burning in my throat" given GI cocktail and this resolves.  No history of Heart Cath.  Per Cardiology consult 10/15:    -- MPI 09/09/14: Low risk stress nuclear study. No reversible ischemia. LV systolic function is mildly depressed..  LV Ejection Fraction: 48%. LV Wall Motion: Mild global depression of LV systolic function without segmental wall motion abnormalities.  Tanna Furry, MD 11/01/14 [phone]

## 2014-10-29 NOTE — ED Notes (Addendum)
Pharmacy called regarding heparin bolus.  Will hold due to being on Xarelto

## 2014-10-29 NOTE — ED Notes (Signed)
IV attempted x 2 by this RN; second RN at bedside

## 2014-10-29 NOTE — ED Notes (Signed)
Pt c/o chest pain associated with increased shortness of breath. Reports having to sit up and attempt to sleep due to increased shortness of breath. Bilateral edema noted. Pt also has a small dime sized wound noted to left lower leg that drains "water." Pt hyperventilating on initial assessment; hx of anxiety. Chest pains radiate down both arms. Coarse wheezes noted to LLL

## 2014-10-29 NOTE — ED Notes (Signed)
Pt ate breakfast tray.

## 2014-10-29 NOTE — ED Notes (Signed)
Troponin 0.83 per lab.  Dr Jeneen Rinks notified

## 2014-10-29 NOTE — ED Notes (Signed)
Pt denies chest pain

## 2014-10-29 NOTE — ED Notes (Signed)
Attempt 2 times to start second IV  No success

## 2014-10-29 NOTE — Progress Notes (Addendum)
ANTICOAGULATION CONSULT NOTE - Initial Consult  Pharmacy Consult for heparin  Indication: chest pain/ACS  No Known Allergies  Patient Measurements: Height: 5\' 4"  (162.6 cm) Weight: 220 lb (99.791 kg) IBW/kg (Calculated) : 54.7 Heparin Dosing Weight:   Vital Signs: Temp: 98 F (36.7 C) (12/16 0528) Temp Source: Oral (12/16 0528) BP: 120/69 mmHg (12/16 0530) Pulse Rate: 112 (12/16 0530)  Labs:  Recent Labs  10/29/14 0222 10/29/14 0330 10/29/14 0507  HGB 11.2*  --   --   HCT 34.7*  --   --   PLT 305  --   --   LABPROT  --  15.1  --   INR  --  1.18  --   CREATININE 0.69  --   --   TROPONINI  --   --  0.83*    Estimated Creatinine Clearance: 94.4 mL/min (by C-G formula based on Cr of 0.69).   Medical History: Past Medical History  Diagnosis Date  . COPD (chronic obstructive pulmonary disease)   . Alcohol abuse   . Narcotic abuse   . Cocaine abuse   . Marijuana abuse   . Poorly controlled diabetes mellitus   . COPD (chronic obstructive pulmonary disease)   . Tobacco abuse   . Heart failure   . Alcoholic cirrhosis   . Cardiomyopathy   . Peripheral arterial disease   . Obesity   . CHF (congestive heart failure)   . Hypothyroidism   . GERD (gastroesophageal reflux disease)   . Headache(784.0)     "@ least once or twice/wk" (09/10/2013)  . Arthritis     "hands, arms, shoulders, legs, back" (09/10/2013)  . Atrial fibrillation   . Critical lower limb ischemia   . Peripheral arterial disease     Medications:   (Not in a hospital admission)  Assessment Heparin for ACS. Last documented dose of xarelto is on 12/15. Heparin bolus authorized by ED MD. Will obtain an aPTT and continue Heparin drip at 1000 units/hr. Heparin bolus not yet given. Begin infusion with no bolus.  Goal of Therapy:  Heparin level 0.3-0.7 units/ml Monitor platelets by anticoagulation protocol: Yes   Plan:  Continue heparin at 1000 units/hr  APTT at 1300  Curlene Dolphin 10/29/2014,6:35 AM

## 2014-10-29 NOTE — Progress Notes (Signed)
ANTICOAGULATION CONSULT NOTE  Pharmacy Consult for heparin  Indication: chest pain/ACS  No Known Allergies  Patient Measurements: Height: 5\' 4"  (162.6 cm) Weight: 220 lb 9.6 oz (100.064 kg) IBW/kg (Calculated) : 54.7 Heparin Dosing Weight: 78kg  Vital Signs: Temp: 98.4 F (36.9 C) (12/16 1300) Temp Source: Oral (12/16 1300) BP: 123/65 mmHg (12/16 1300) Pulse Rate: 116 (12/16 1300)  Labs:  Recent Labs  10/29/14 0222 10/29/14 0330 10/29/14 0507 10/29/14 1145 10/29/14 1330 10/29/14 1336  HGB 11.2*  --   --   --   --   --   HCT 34.7*  --   --   --   --   --   PLT 305  --   --   --   --   --   APTT  --   --   --   --  35  --   LABPROT  --  15.1  --   --   --   --   INR  --  1.18  --   --   --   --   HEPARINUNFRC  --   --   --   --   --  0.40  CREATININE 0.69  --   --   --   --   --   TROPONINI  --   --  0.83* 3.89*  --   --     Estimated Creatinine Clearance: 94.7 mL/min (by C-G formula based on Cr of 0.69).  Assessment 53 YOF started on heparin for ACS. PTA she is on Xarelto- last documented dose 12/15 PM. She was started on heparin IV with no bolus at 1000 units/hr. Initial heparin level is in range at 0.4 units/mL, however aPTT is below goal at 35 seconds which indicates Xarelto is affecting heparin level, and in reality heparin is not yet at goal. Hgb 11.2, plts 305- no bleeding noted.  Goal of Therapy:  Heparin level 0.3-0.7 units/ml aPTT 66-102 seconds Monitor platelets by anticoagulation protocol: Yes   Plan:  1. Increase heparin to 1300 units/hr- about a 4unit/kg/hr rate increase 2. HL and aPTT at 2100 3. Daily aPTT, HL and CBC for now 4. Cath planned for tomorrow- follow for any changes in plans 5. Follow for s/s bleeding  Parrie Rasco D. Nicandro Perrault, PharmD, BCPS Clinical Pharmacist Pager: [phone] 10/29/2014 2:51 PM

## 2014-10-30 ENCOUNTER — Other Ambulatory Visit: Payer: Self-pay | Admitting: Internal Medicine

## 2014-10-30 DIAGNOSIS — I519 Heart disease, unspecified: Secondary | ICD-10-CM

## 2014-10-30 DIAGNOSIS — E1165 Type 2 diabetes mellitus with hyperglycemia: Secondary | ICD-10-CM

## 2014-10-30 DIAGNOSIS — R509 Fever, unspecified: Secondary | ICD-10-CM

## 2014-10-30 LAB — BASIC METABOLIC PANEL
ANION GAP: 13 (ref 5–15)
Anion gap: 15 (ref 5–15)
BUN: 30 mg/dL — ABNORMAL HIGH (ref 6–23)
BUN: 32 mg/dL — ABNORMAL HIGH (ref 6–23)
CALCIUM: 9 mg/dL (ref 8.4–10.5)
CHLORIDE: 95 meq/L — AB (ref 96–112)
CO2: 22 mEq/L (ref 19–32)
CO2: 26 meq/L (ref 19–32)
CREATININE: 0.93 mg/dL (ref 0.50–1.10)
Calcium: 8.9 mg/dL (ref 8.4–10.5)
Chloride: 99 mEq/L (ref 96–112)
Creatinine, Ser: 1.03 mg/dL (ref 0.50–1.10)
GFR calc Af Amer: 80 mL/min — ABNORMAL LOW (ref >90)
GFR calc Af Amer: 71 mL/min — ABNORMAL LOW (ref >90)
GFR calc non Af Amer: 61 mL/min — ABNORMAL LOW (ref >90)
GFR, EST NON AFRICAN AMERICAN: 69 mL/min — AB (ref >90)
GLUCOSE: 250 mg/dL — AB (ref 70–99)
GLUCOSE: 323 mg/dL — AB (ref 70–99)
POTASSIUM: 4.9 meq/L (ref 3.7–5.3)
Potassium: 4.5 mEq/L (ref 3.7–5.3)
SODIUM: 136 meq/L — AB (ref 137–147)
SODIUM: 134 meq/L — AB (ref 137–147)

## 2014-10-30 LAB — CBC
HEMATOCRIT: 30.9 % — AB (ref 36.0–46.0)
HEMATOCRIT: 31 % — AB (ref 36.0–46.0)
Hemoglobin: 9.9 g/dL — ABNORMAL LOW (ref 12.0–15.0)
Hemoglobin: 9.9 g/dL — ABNORMAL LOW (ref 12.0–15.0)
MCH: 28.6 pg (ref 26.0–34.0)
MCH: 28.5 pg (ref 26.0–34.0)
MCHC: 32 g/dL (ref 30.0–36.0)
MCHC: 31.9 g/dL (ref 30.0–36.0)
MCV: 89.3 fL (ref 78.0–100.0)
MCV: 89.3 fL (ref 78.0–100.0)
Platelets: 264 10*3/uL (ref 150–400)
Platelets: 289 10*3/uL (ref 150–400)
RBC: 3.46 MIL/uL — ABNORMAL LOW (ref 3.87–5.11)
RBC: 3.47 MIL/uL — AB (ref 3.87–5.11)
RDW: 14.2 % (ref 11.5–15.5)
RDW: 14.3 % (ref 11.5–15.5)
WBC: 15.9 10*3/uL — ABNORMAL HIGH (ref 4.0–10.5)
WBC: 13.2 10*3/uL — AB (ref 4.0–10.5)

## 2014-10-30 LAB — TROPONIN I: Troponin I: 1.1 ng/mL (ref <0.30)

## 2014-10-30 LAB — URINALYSIS, ROUTINE W REFLEX MICROSCOPIC
BILIRUBIN URINE: NEGATIVE
Glucose, UA: 100 mg/dL — AB
Ketones, ur: NEGATIVE mg/dL
Nitrite: NEGATIVE
Protein, ur: 100 mg/dL — AB
Specific Gravity, Urine: 1.018 (ref 1.005–1.030)
UROBILINOGEN UA: 0.2 mg/dL (ref 0.0–1.0)
pH: 5.5 (ref 5.0–8.0)

## 2014-10-30 LAB — HEPATIC FUNCTION PANEL
ALBUMIN: 2.9 g/dL — AB (ref 3.5–5.2)
ALT: 34 U/L (ref 0–35)
AST: 39 U/L — AB (ref 0–37)
Alkaline Phosphatase: 188 U/L — ABNORMAL HIGH (ref 39–117)
Total Bilirubin: <0.2 mg/dL — ABNORMAL LOW (ref 0.3–1.2)
Total Protein: 6.6 g/dL (ref 6.0–8.3)

## 2014-10-30 LAB — GLUCOSE, CAPILLARY
Glucose-Capillary: 219 mg/dL — ABNORMAL HIGH (ref 70–99)
Glucose-Capillary: 341 mg/dL — ABNORMAL HIGH (ref 70–99)
Glucose-Capillary: 270 mg/dL — ABNORMAL HIGH (ref 70–99)
Glucose-Capillary: 361 mg/dL — ABNORMAL HIGH (ref 70–99)

## 2014-10-30 LAB — APTT: APTT: 47 s — AB (ref 24–37)

## 2014-10-30 LAB — MAGNESIUM: Magnesium: 1.8 mg/dL (ref 1.5–2.5)

## 2014-10-30 LAB — URINE MICROSCOPIC-ADD ON

## 2014-10-30 LAB — INFLUENZA PANEL BY PCR (TYPE A & B)
H1N1 flu by pcr: NOT DETECTED
INFLBPCR: NEGATIVE
Influenza A By PCR: NEGATIVE

## 2014-10-30 LAB — HEPARIN LEVEL (UNFRACTIONATED): Heparin Unfractionated: 0.4 IU/mL (ref 0.30–0.70)

## 2014-10-30 MED ORDER — LEVALBUTEROL HCL 0.63 MG/3ML IN NEBU
0.6300 mg | INHALATION_SOLUTION | Freq: Three times a day (TID) | RESPIRATORY_TRACT | Status: DC | PRN
  Administered 2014-11-01 (×2): 0.63 mg via RESPIRATORY_TRACT
  Filled 2014-10-30 (×2): qty 3

## 2014-10-30 MED ORDER — AMIODARONE HCL IN DEXTROSE 360-4.14 MG/200ML-% IV SOLN
60.0000 mg/h | INTRAVENOUS | Status: AC
  Administered 2014-10-30 – 2014-10-31 (×2): 60 mg/h via INTRAVENOUS

## 2014-10-30 MED ORDER — METOPROLOL TARTRATE 1 MG/ML IV SOLN
2.5000 mg | INTRAVENOUS | Status: AC
  Administered 2014-10-30: 5 mg via INTRAVENOUS

## 2014-10-30 MED ORDER — AMIODARONE HCL IN DEXTROSE 360-4.14 MG/200ML-% IV SOLN
30.0000 mg/h | INTRAVENOUS | Status: DC
  Administered 2014-10-31 – 2014-11-01 (×4): 30 mg/h via INTRAVENOUS
  Filled 2014-10-30 (×4): qty 200

## 2014-10-30 MED ORDER — AMIODARONE LOAD VIA INFUSION
150.0000 mg | Freq: Once | INTRAVENOUS | Status: AC
  Administered 2014-10-30: 150 mg via INTRAVENOUS
  Filled 2014-10-30: qty 83.34

## 2014-10-30 MED ORDER — METOPROLOL TARTRATE 1 MG/ML IV SOLN
5.0000 mg | Freq: Once | INTRAVENOUS | Status: AC
  Administered 2014-10-30: 5 mg via INTRAVENOUS

## 2014-10-30 MED ORDER — DILTIAZEM HCL 100 MG IV SOLR
10.0000 mg/h | INTRAVENOUS | Status: DC
  Administered 2014-10-30 – 2014-10-31 (×3): 10 mg/h via INTRAVENOUS
  Filled 2014-10-30: qty 100

## 2014-10-30 MED ORDER — METOPROLOL TARTRATE 1 MG/ML IV SOLN
INTRAVENOUS | Status: AC
  Filled 2014-10-30: qty 5

## 2014-10-30 MED ORDER — ACETAMINOPHEN 325 MG PO TABS
650.0000 mg | ORAL_TABLET | ORAL | Status: DC | PRN
  Administered 2014-10-30 (×2): 650 mg via ORAL
  Filled 2014-10-30 (×2): qty 2

## 2014-10-30 MED ORDER — AMIODARONE HCL IN DEXTROSE 360-4.14 MG/200ML-% IV SOLN
INTRAVENOUS | Status: AC
  Filled 2014-10-30: qty 200

## 2014-10-30 MED ORDER — METOPROLOL TARTRATE 1 MG/ML IV SOLN
2.5000 mg | INTRAVENOUS | Status: AC
  Administered 2014-10-30: 2.5 mg via INTRAVENOUS

## 2014-10-30 NOTE — Progress Notes (Signed)
ANTICOAGULATION CONSULT NOTE Pharmacy Consult for heparin  Indication: chest pain/ACS  No Known Allergies  Patient Measurements: Height: 5\' 4"  (162.6 cm) Weight: 229 lb (103.874 kg) IBW/kg (Calculated) : 54.7 Heparin Dosing Weight: 78kg  Vital Signs: Temp: 100.8 F (38.2 C) (12/17 0431) Temp Source: Oral (12/17 0431) BP: 146/73 mmHg (12/17 0431) Pulse Rate: 109 (12/17 0431)  Labs:  Recent Labs  10/29/14 0222 10/29/14 0330  10/29/14 1145 10/29/14 1330 10/29/14 1336 10/29/14 1700 10/29/14 2100 10/29/14 2306 10/30/14 0503  HGB 11.2*  --   --   --   --   --   --   --   --  9.9*  HCT 34.7*  --   --   --   --   --   --   --   --  30.9*  PLT 305  --   --   --   --   --   --   --   --  264  APTT  --   --   --   --  35  --   --  39*  --  47*  LABPROT  --  15.1  --   --   --   --   --   --   --   --   INR  --  1.18  --   --   --   --   --   --   --   --   HEPARINUNFRC  --   --   --   --   --  0.40  --  0.27*  --  0.40  CREATININE 0.69  --   --   --   --   --   --   --   --  0.93  TROPONINI  --   --   < > 3.89*  --   --  3.03*  --  2.72*  --   < > = values in this interval not displayed.  Estimated Creatinine Clearance: 83.1 mL/min (by C-G formula based on Cr of 0.93).  Assessment 53 y.o. female with chest pain, Xarelto on hold, for heparin   Goal of Therapy:  Heparin level 0.3-0.7 units/ml aPTT 66-102 seconds Monitor platelets by anticoagulation protocol: Yes   Plan:  Continue Heparin at current rate  Phillis Knack, PharmD, BCPS

## 2014-10-30 NOTE — Care Management Note (Addendum)
    Page 1 of 1   11/04/2014     11:06:56 AM CARE MANAGEMENT NOTE 11/04/2014  Patient:  [name], [name]   Account Number:  [id number]  Date Initiated:  10/30/2014  Documentation initiated by:  GRAVES-BIGELOW,Ersa Delaney  Subjective/Objective Assessment:   Pt admitted for CP for several hours.  Now has + Troponin levels.  Cath cancelled due to fever and leukocytosis. Plan for cath 10-31-14.     Action/Plan:   No needs identified by CM at this time.   Anticipated DC Date:  11/01/2014   Anticipated DC Plan:  [place]  CM consult      Choice offered to / List presented to:             Status of service:  Completed, signed off Medicare Important Message given?  NO (If response is "NO", the following Medicare IM given date fields will be blank) Date Medicare IM given:   Medicare IM given by:   Date Additional Medicare IM given:   Additional Medicare IM given by:    Discharge Disposition:  HOME/SELF CARE  Per UR Regulation:  Reviewed for med. necessity/level of care/duration of stay  If discussed at [place] of Stay Meetings, dates discussed:   11/04/2014    Comments:   11-04-14 [address] [place] [phone] CM did speak with pt in regards to Pharmacy and pt uses [facility] mail order. MD can either fax Rx to [phone] or escribe medications. CM provided pt with the 30 day free xarelto card. Pt will need Rx for 30 day supply once stable for d/c.  Pt can use a local pharamcy [place]. Pharmacy has medicaiton xarelto available. No further needs identified at this time.   11-03-14 1436 Jacqlyn Krauss, RN,BSN [phone] Pt in for Nstemi- Afib- continues on IV amio gtt. CM will continue to monitor for disposition needs.

## 2014-10-30 NOTE — Progress Notes (Signed)
Influenza PCR negative.  Pt removed from droplet precautions. Will continue to monitor.

## 2014-10-30 NOTE — Progress Notes (Signed)
HR still > 140 most of time, discussed with Dr. Marlou Porch will try another 5 mg IV lopressor and after 20 min if still elevated would go to IV amiodarone.  RN aware and will call MD on call for the order.

## 2014-10-30 NOTE — Progress Notes (Signed)
Inpatient Diabetes Program Recommendations  AACE/ADA: New Consensus Statement on Inpatient Glycemic Control (2013)  Target Ranges:  Prepandial:   less than 140 mg/dL      Peak postprandial:   less than 180 mg/dL (1-2 hours)      Critically ill patients:  140 - 180 mg/dL     Results for [name], [name] (MRN [id number]) as of 10/30/2014 11:34  Ref. Range 10/29/2014 13:18 10/29/2014 16:37 10/29/2014 21:00  Glucose-Capillary Latest Range: 70-99 mg/dL 295 (H) 294 (H) 404 (H)    Results for [name], [name] (MRN [id number]) as of 10/30/2014 11:34  Ref. Range 10/30/2014 07:38  Glucose-Capillary Latest Range: 70-99 mg/dL 219 (H)     Admitted with CP/ NSTEMI.  History of DM, COPD, Cocaine abuse.   Home DM Meds: Glipizide 30 mg daily       Metformin 1000 mg bid       Lantus 35 units QHS       70/30 insulin- 4-10 units tid per SSI   Current Insulin Orders: Glipizide 30 mg daily      Lantus 35 units QHS      Novolog Sensitive SSI    **Patient will be NPO after midnight for cardiac cath tomorrow AM.  **Having glucose elevations.   MD- Please consider the following insulin adjustments:  1. Increase Lantus by 20%- Lantus 42 units QHS 2. Add Novolog Meal Coverage- Novolog 4 units tid with meals    Will follow Wyn Quaker RN, MSN, CDE Diabetes Coordinator Inpatient Diabetes Program Team Pager: [phone] (8a-10p)

## 2014-10-30 NOTE — Progress Notes (Signed)
*  PRELIMINARY RESULTS* Echocardiogram 2D Echocardiogram has been performed.  Leavy Cella 10/30/2014, 12:44 PM

## 2014-10-30 NOTE — Progress Notes (Signed)
Called due to pt's HR up to 180  A fib.  Some chest pain.  Was to have cath today but cancelled due fever.  Flu screen neg.  IV lopressor 5 mg IV with some slowing but then back up to 150.  Will give dilt bolus and drip at 10 mg.  + chest pain but I believe.  Due to HR.  If no improvement soon, will transfer to another unit. Will recheck labs.  On IV heaprin.  MD aware.

## 2014-10-30 NOTE — Progress Notes (Signed)
Subjective: Chest tightness this morning which improved after RN gave her a small pill.  Objective: Vital signs in last 24 hours: Temp:  [98.4 F (36.9 C)-100.8 F (38.2 C)] 100.8 F (38.2 C) (12/17 0431) Pulse Rate:  [37-120] 109 (12/17 0431) Resp:  [14-23] 17 (12/16 2102) BP: (108-151)/(57-113) 146/73 mmHg (12/17 0431) SpO2:  [92 %-100 %] 97 % (12/17 0431) Weight:  [220 lb 9.6 oz (100.064 kg)-229 lb (103.874 kg)] 229 lb (103.874 kg) (12/17 0431) Last BM Date: 10/28/14  Intake/Output from previous day: 12/16 0701 - 12/17 0700 In: -  Out: 150 [Urine:150] Intake/Output this shift:    Medications Current Facility-Administered Medications  Medication Dose Route Frequency Provider Last Rate Last Dose  . 0.9 %  sodium chloride infusion   Intravenous Continuous Thayer Headings, MD 100 mL/hr at 10/30/14 0422    . 0.9 %  sodium chloride infusion  250 mL Intravenous PRN Thayer Headings, MD      . acetaminophen (TYLENOL) tablet 650 mg  650 mg Oral Q4H PRN Cletus Gash, MD   650 mg at 10/30/14 0459  . albuterol (PROVENTIL) (2.5 MG/3ML) 0.083% nebulizer solution 2.5 mg  2.5 mg Nebulization Q4H PRN Josue Hector, MD      . aspirin EC tablet 81 mg  81 mg Oral Daily Thayer Headings, MD      . atorvastatin (LIPITOR) tablet 40 mg  40 mg Oral q1800 Thayer Headings, MD   40 mg at 10/29/14 1652  . carvedilol (COREG) tablet 6.25 mg  6.25 mg Oral BID WC Thayer Headings, MD   6.25 mg at 10/29/14 1652  . clopidogrel (PLAVIX) tablet 75 mg  75 mg Oral Q breakfast Thayer Headings, MD      . furosemide (LASIX) tablet 40 mg  40 mg Oral BID Thayer Headings, MD   40 mg at 10/29/14 1652  . gabapentin (NEURONTIN) capsule 300 mg  300 mg Oral BID Thayer Headings, MD   300 mg at 10/29/14 2133  . glipiZIDE (GLUCOTROL XL) 24 hr tablet 30 mg  30 mg Oral Q breakfast Thayer Headings, MD   30 mg at 10/30/14 0747  . heparin ADULT infusion 100 units/mL (25000 units/250 mL)  1,600 Units/hr Intravenous Continuous  Charmian Muff McCammon, RPH 16 mL/hr at 10/30/14 0156 1,600 Units/hr at 10/30/14 0156  . insulin aspart (novoLOG) injection 0-9 Units  0-9 Units Subcutaneous TID WC Dayna N Dunn, PA-C   5 Units at 10/29/14 1652  . insulin glargine (LANTUS) injection 35 Units  35 Units Subcutaneous QHS Thayer Headings, MD   35 Units at 10/29/14 2133  . ipratropium-albuterol (DUONEB) 0.5-2.5 (3) MG/3ML nebulizer solution 3 mL  3 mL Nebulization Q6H PRN Thayer Headings, MD      . irbesartan (AVAPRO) tablet 75 mg  75 mg Oral Daily Thayer Headings, MD   75 mg at 10/29/14 1331  . isosorbide mononitrate (IMDUR) 24 hr tablet 30 mg  30 mg Oral Daily Thayer Headings, MD   30 mg at 10/29/14 1331  . levothyroxine (SYNTHROID, LEVOTHROID) tablet 50 mcg  50 mcg Oral QAC breakfast Thayer Headings, MD      . morphine 4 MG/ML injection 4 mg  4 mg Intravenous Q1H PRN Tanna Furry, MD   4 mg at 10/29/14 1131  . multivitamin with minerals tablet 1 tablet  1 tablet Oral Daily Josue Hector, MD   1 tablet at 10/29/14 1330  .  nitroGLYCERIN (NITROSTAT) SL tablet 0.4 mg  0.4 mg Sublingual Q5 Min x 3 PRN Thayer Headings, MD      . ondansetron [facility]) injection 4 mg  4 mg Intravenous Q6H PRN Thayer Headings, MD      . oxyCODONE (Oxy IR/ROXICODONE) immediate release tablet 5 mg  5 mg Oral Q8H PRN Thayer Headings, MD   5 mg at 10/30/14 0416  . silver sulfADIAZINE (SILVADENE) 1 % cream 1 application  1 application Topical Daily PRN Thayer Headings, MD      . sodium chloride 0.9 % injection 3 mL  3 mL Intravenous Q12H Thayer Headings, MD   3 mL at 10/29/14 2132  . sodium chloride 0.9 % injection 3 mL  3 mL Intravenous PRN Thayer Headings, MD        PE: General appearance: alert, cooperative and no distress Lungs: Left wheeze. Heart: Reg rhythm. Rate elevated.  No MM Extremities: No LEE Pulses: 2+ and symmetric Skin: Warm and dry Neurologic: Grossly normal  Lab Results:   Recent Labs  10/29/14 0222 10/30/14 0503  WBC 12.3* 15.9*  HGB  11.2* 9.9*  HCT 34.7* 30.9*  PLT 305 264   BMET  Recent Labs  10/29/14 0222 10/30/14 0503  NA 138 136*  K 3.9 4.5  CL 97 99  CO2 27 22  GLUCOSE 247* 250*  BUN 23 30*  CREATININE 0.69 0.93  CALCIUM 9.7 9.0   PT/INR  Recent Labs  10/29/14 0330  LABPROT 15.1  INR 1.18   Cardiac Panel (last 3 results)  Recent Labs  10/29/14 1145 10/29/14 1700 10/29/14 2306  TROPONINI 3.89* 3.03* 2.72*   Lipid Panel     Component Value Date/Time   CHOL 258* 07/26/2013 1242   TRIG 232* 07/26/2013 1242   HDL 58 07/26/2013 1242   CHOLHDL 4.4 07/26/2013 1242   VLDL 46* 07/26/2013 1242   LDLCALC 154* 07/26/2013 1242      Assessment/Plan  Active Problems:   NSTEMI (non-ST elevated myocardial infarction) Troponin 3.89 and trending down.  Chest tightness this morning which is almost resolved.  Cath cancelled due to fever and leukocytosis.  Reschedule for tomorrow.  Takes plavix.   Febrile   Leukocytosis Urine culture, blood culture, Influenza all pending.  CXR showed no active cardiopulm disease.  Was around her sister's friend who recently may have had resp infection.      COPD  Change to xopenex nebs since she is tachycardic      HLD  Elevated.  On lipitor    Tobacco abuse    PAF  Xarelto held for cath    Obesity   LOS: 1 day    HAGER, BRYAN PA-C 10/30/2014 7:48 AM  Patient seen and examined and history reviewed. Agree with above findings and plan. Patient complains of feeling weak and feverish. No chills. Complains of difficulty voiding. Minimal cough. No nausea or diarrhea. Some chest pain last pm. On exam lungs reveal scattered wheezing. She is obese. Abdomen is beingn. No edema. Telemetry demonstrates sustained sinus tachycardiac with rate 120. No acute ST-T changes on Ecg but troponins are elevated.  Impression: 1. Elevated troponins. This may be NSTEMI versus demand ischemia from febrile illness. Continue IV heparin. On ASA and Plavix. On isosorbide and  carvedilol. Will check Echo today. Will postpone cardiac cath due to acute febrile illness. 2. Acute febrile illness. Swab pending for flu. UA and culture pending. Hold antibiotics until source identified. 3. DM on  insulin. Poor control. Metformin held. On SSI.  4. PAF. In sinus tachy now. Xarelto on hold pending cardiac cath.    [place], [place] 10/30/2014 8:44 AM

## 2014-10-31 ENCOUNTER — Encounter (HOSPITAL_COMMUNITY): Payer: Self-pay | Admitting: Cardiovascular Disease

## 2014-10-31 ENCOUNTER — Encounter (HOSPITAL_COMMUNITY)
Admission: EM | Disposition: A | Discharge status: Home / Self Care | Payer: Self-pay | Source: Home / Self Care | Attending: Cardiovascular Disease

## 2014-10-31 DIAGNOSIS — I2510 Atherosclerotic heart disease of native coronary artery without angina pectoris: Secondary | ICD-10-CM

## 2014-10-31 DIAGNOSIS — E1140 Type 2 diabetes mellitus with diabetic neuropathy, unspecified: Secondary | ICD-10-CM

## 2014-10-31 DIAGNOSIS — J449 Chronic obstructive pulmonary disease, unspecified: Secondary | ICD-10-CM

## 2014-10-31 HISTORY — PX: LEFT HEART CATHETERIZATION WITH CORONARY ANGIOGRAM: SHX5451

## 2014-10-31 LAB — GLUCOSE, CAPILLARY
Glucose-Capillary: 262 mg/dL — ABNORMAL HIGH (ref 70–99)
Glucose-Capillary: 283 mg/dL — ABNORMAL HIGH (ref 70–99)
Glucose-Capillary: 348 mg/dL — ABNORMAL HIGH (ref 70–99)
Glucose-Capillary: 237 mg/dL — ABNORMAL HIGH (ref 70–99)

## 2014-10-31 LAB — APTT: APTT: 49 s — AB (ref 24–37)

## 2014-10-31 LAB — TROPONIN I
Troponin I: 1.07 ng/mL (ref <0.30)
Troponin I: 0.78 ng/mL (ref <0.30)

## 2014-10-31 LAB — CBC
HCT: 30.9 % — ABNORMAL LOW (ref 36.0–46.0)
HEMOGLOBIN: 9.8 g/dL — AB (ref 12.0–15.0)
MCH: 28.2 pg (ref 26.0–34.0)
MCHC: 31.7 g/dL (ref 30.0–36.0)
MCV: 89 fL (ref 78.0–100.0)
PLATELETS: 281 10*3/uL (ref 150–400)
RBC: 3.47 MIL/uL — ABNORMAL LOW (ref 3.87–5.11)
RDW: 14.5 % (ref 11.5–15.5)
WBC: 14.1 10*3/uL — ABNORMAL HIGH (ref 4.0–10.5)

## 2014-10-31 LAB — INFLUENZA VIRUS AG, A+B (DFA)

## 2014-10-31 LAB — HEPARIN LEVEL (UNFRACTIONATED): Heparin Unfractionated: 0.22 IU/mL — ABNORMAL LOW (ref 0.30–0.70)

## 2014-10-31 SURGERY — LEFT HEART CATHETERIZATION WITH CORONARY ANGIOGRAM
Anesthesia: LOCAL

## 2014-10-31 MED ORDER — OXYCODONE HCL 5 MG PO TABS
5.0000 mg | ORAL_TABLET | Freq: Three times a day (TID) | ORAL | Status: DC | PRN
  Administered 2014-10-31 – 2014-11-05 (×8): 5 mg via ORAL
  Filled 2014-10-31 (×8): qty 1

## 2014-10-31 MED ORDER — LIDOCAINE HCL (PF) 1 % IJ SOLN
INTRAMUSCULAR | Status: AC
  Filled 2014-10-31: qty 30

## 2014-10-31 MED ORDER — FENTANYL CITRATE 0.05 MG/ML IJ SOLN
INTRAMUSCULAR | Status: AC
  Filled 2014-10-31: qty 2

## 2014-10-31 MED ORDER — SODIUM CHLORIDE 0.9 % IV SOLN
INTRAVENOUS | Status: AC
  Administered 2014-10-31: 75 mL via INTRAVENOUS

## 2014-10-31 MED ORDER — INSULIN GLARGINE 100 UNIT/ML ~~LOC~~ SOLN
42.0000 [IU] | Freq: Every day | SUBCUTANEOUS | Status: DC
  Filled 2014-10-31: qty 0.42

## 2014-10-31 MED ORDER — HEPARIN (PORCINE) IN NACL 2-0.9 UNIT/ML-% IJ SOLN
INTRAMUSCULAR | Status: AC
  Filled 2014-10-31: qty 1500

## 2014-10-31 MED ORDER — NICOTINE 21 MG/24HR TD PT24
21.0000 mg | MEDICATED_PATCH | Freq: Every day | TRANSDERMAL | Status: DC
  Administered 2014-10-31 – 2014-11-06 (×7): 21 mg via TRANSDERMAL
  Filled 2014-10-31 (×7): qty 1

## 2014-10-31 MED ORDER — NITROGLYCERIN 1 MG/10 ML FOR IR/CATH LAB
INTRA_ARTERIAL | Status: AC
  Filled 2014-10-31: qty 10

## 2014-10-31 MED ORDER — ALPRAZOLAM 0.25 MG PO TABS
0.2500 mg | ORAL_TABLET | Freq: Two times a day (BID) | ORAL | Status: DC | PRN
  Administered 2014-10-31 – 2014-11-05 (×4): 0.25 mg via ORAL
  Filled 2014-10-31 (×4): qty 1

## 2014-10-31 MED ORDER — INSULIN ASPART 100 UNIT/ML ~~LOC~~ SOLN
4.0000 [IU] | Freq: Three times a day (TID) | SUBCUTANEOUS | Status: DC
  Administered 2014-10-31 – 2014-11-06 (×18): 4 [IU] via SUBCUTANEOUS

## 2014-10-31 MED ORDER — MIDAZOLAM HCL 2 MG/2ML IJ SOLN
INTRAMUSCULAR | Status: AC
  Filled 2014-10-31: qty 2

## 2014-10-31 MED ORDER — ASPIRIN 81 MG PO CHEW
CHEWABLE_TABLET | ORAL | Status: AC
  Filled 2014-10-31: qty 1

## 2014-10-31 MED ORDER — DILTIAZEM HCL 100 MG IV SOLR
5.0000 mg/h | INTRAVENOUS | Status: DC
  Administered 2014-10-31 (×2): 15 mg/h via INTRAVENOUS
  Filled 2014-10-31: qty 100

## 2014-10-31 MED ORDER — DILTIAZEM LOAD VIA INFUSION
10.0000 mg | Freq: Once | INTRAVENOUS | Status: AC
  Administered 2014-10-31: 10 mg via INTRAVENOUS
  Filled 2014-10-31: qty 10

## 2014-10-31 MED ORDER — GABAPENTIN 300 MG PO CAPS
300.0000 mg | ORAL_CAPSULE | Freq: Two times a day (BID) | ORAL | Status: DC
  Administered 2014-10-31 – 2014-11-06 (×12): 300 mg via ORAL
  Filled 2014-10-31 (×12): qty 1

## 2014-10-31 MED ORDER — HEPARIN SODIUM (PORCINE) 1000 UNIT/ML IJ SOLN
INTRAMUSCULAR | Status: AC
  Filled 2014-10-31: qty 1

## 2014-10-31 MED ORDER — LORAZEPAM 1 MG PO TABS: 1.0000 mg | ORAL_TABLET | Freq: Once | ORAL | Status: DC

## 2014-10-31 MED ORDER — HEPARIN (PORCINE) IN NACL 100-0.45 UNIT/ML-% IJ SOLN
1800.0000 [IU]/h | INTRAMUSCULAR | Status: DC
  Administered 2014-10-31: 1800 [IU]/h via INTRAVENOUS
  Filled 2014-10-31: qty 250

## 2014-10-31 MED ORDER — VERAPAMIL HCL 2.5 MG/ML IV SOLN
INTRAVENOUS | Status: AC
  Filled 2014-10-31: qty 2

## 2014-10-31 NOTE — Progress Notes (Signed)
Results for OREDA, MADL (MRN [id number]) as of 10/31/2014 15:07  Ref. Range 10/30/2014 11:47 10/30/2014 17:17 10/30/2014 19:55 10/31/2014 07:59 10/31/2014 11:23  Glucose-Capillary Latest Range: 70-99 mg/dL 341 (H) 270 (H) 361 (H) 262 (H) 283 (H)  Recommend restarting home dose Lantus 35 units daily if CBGs continue to be greater than 180 mg/dl.  Continue Novolog correction scale and 4 units TID of meal coverage. Harvel Ricks RN BSN CDE

## 2014-10-31 NOTE — Progress Notes (Signed)
Subjective: No chest pain currently. She did have chest pain yesterday. No SOB, cough, abd. Pain. Denies palpitations.  Objective: Vital signs in last 24 hours: Temp:  [98.3 F (36.8 C)-99.2 F (37.3 C)] 98.9 F (37.2 C) (12/18 0603) Pulse Rate:  [107-153] 135 (12/18 0603) Resp:  [20-28] 28 (12/18 0603) BP: (101-158)/(58-145) 113/91 mmHg (12/18 0603) SpO2:  [90 %-100 %] 95 % (12/18 0603) Weight:  [228 lb 11.2 oz (103.738 kg)] 228 lb 11.2 oz (103.738 kg) (12/18 0603) Last BM Date: 10/28/14  Intake/Output from previous day: 12/17 0701 - 12/18 0700 In: -  Out: 500 [Urine:500] Intake/Output this shift:   Telemetry: Atrial fibrillation with RVR. Rate 120.  Medications Current Facility-Administered Medications  Medication Dose Route Frequency Provider Last Rate Last Dose  . 0.9 %  sodium chloride infusion   Intravenous Continuous Thayer Headings, MD 100 mL/hr at 10/31/14 [phone]    . 0.9 %  sodium chloride infusion  250 mL Intravenous PRN Thayer Headings, MD      . acetaminophen (TYLENOL) tablet 650 mg  650 mg Oral Q4H PRN Cletus Gash, MD   650 mg at 10/30/14 0931  . amiodarone (NEXTERONE PREMIX) 360 MG/200ML (1.8 mg/mL) IV infusion  30 mg/hr Intravenous Continuous Skeet Latch, MD 16.7 mL/hr at 10/31/14 0509 30 mg/hr at 10/31/14 0509  . amiodarone (NEXTERONE PREMIX) 360 MG/200ML (1.8 mg/mL) IV infusion           . aspirin EC tablet 81 mg  81 mg Oral Daily Thayer Headings, MD   81 mg at 10/30/14 0930  . atorvastatin (LIPITOR) tablet 40 mg  40 mg Oral q1800 Thayer Headings, MD   40 mg at 10/30/14 1722  . carvedilol (COREG) tablet 6.25 mg  6.25 mg Oral BID WC Thayer Headings, MD   6.25 mg at 10/30/14 1722  . clopidogrel (PLAVIX) tablet 75 mg  75 mg Oral Q breakfast Thayer Headings, MD   75 mg at 10/30/14 [id number]  . diltiazem (CARDIZEM) 1 mg/mL load via infusion 10 mg  10 mg Intravenous Once Danisha Brassfield M [place], MD       And  . diltiazem (CARDIZEM) 100 mg in dextrose 5 % 100 mL (1  mg/mL) infusion  5-15 mg/hr Intravenous Continuous Zachary Nole M [place], MD      . gabapentin (NEURONTIN) capsule 300 mg  300 mg Oral BID Thayer Headings, MD   300 mg at 10/30/14 2231  . glipiZIDE (GLUCOTROL XL) 24 hr tablet 30 mg  30 mg Oral Q breakfast Thayer Headings, MD   30 mg at 10/30/14 0747  . heparin ADULT infusion 100 units/mL (25000 units/250 mL)  1,800 Units/hr Intravenous Continuous Rogue Bussing, [facility] 18 mL/hr at 10/31/14 [id number] 1,800 Units/hr at 10/31/14 [id number]  . insulin aspart (novoLOG) injection 0-9 Units  0-9 Units Subcutaneous TID WC Dayna N Dunn, PA-C   5 Units at 10/30/14 1722  . insulin aspart (novoLOG) injection 4 Units  4 Units Subcutaneous TID WC Andyn Sales M [place], MD      . insulin glargine (LANTUS) injection 42 Units  42 Units Subcutaneous QHS Nickayla Mcinnis M [place], MD      . irbesartan (AVAPRO) tablet 75 mg  75 mg Oral Daily Thayer Headings, MD   75 mg at 10/30/14 0931  . isosorbide mononitrate (IMDUR) 24 hr tablet 30 mg  30 mg Oral Daily Thayer Headings, MD   30 mg at 10/30/14 0931  . levalbuterol (XOPENEX)  nebulizer solution 0.63 mg  0.63 mg Nebulization Q8H PRN Brett Canales, PA-C      . levothyroxine (SYNTHROID, LEVOTHROID) tablet 50 mcg  50 mcg Oral QAC breakfast Thayer Headings, MD   50 mcg at 10/31/14 [phone]  . LORazepam (ATIVAN) tablet 1 mg  1 mg Oral Once Skeet Latch, MD      . metoprolol (LOPRESSOR) 1 MG/ML injection           . multivitamin with minerals tablet 1 tablet  1 tablet Oral Daily Josue Hector, MD   1 tablet at 10/30/14 [phone]  . nitroGLYCERIN (NITROSTAT) SL tablet 0.4 mg  0.4 mg Sublingual Q5 Min x 3 PRN Thayer Headings, MD      . ondansetron [facility]) injection 4 mg  4 mg Intravenous Q6H PRN Thayer Headings, MD      . oxyCODONE (Oxy IR/ROXICODONE) immediate release tablet 5 mg  5 mg Oral Q8H PRN Thayer Headings, MD   5 mg at 10/30/14 1552  . silver sulfADIAZINE (SILVADENE) 1 % cream 1 application  1 application Topical Daily PRN Thayer Headings, MD      .  sodium chloride 0.9 % injection 3 mL  3 mL Intravenous Q12H Thayer Headings, MD   3 mL at 10/30/14 2231  . sodium chloride 0.9 % injection 3 mL  3 mL Intravenous PRN Thayer Headings, MD        PE: General appearance: alert, cooperative and no distress. Obese.  Lungs: Scant left basilar wheeze. Heart: IRR. Rate elevated.  No Murmur or gallop Extremities: No LEE Pulses: 2+ and symmetric Skin: Warm and dry Neurologic: Grossly normal  Lab Results:   Recent Labs  10/30/14 0503 10/30/14 2054 10/31/14 0235  WBC 15.9* 13.2* 14.1*  HGB 9.9* 9.9* 9.8*  HCT 30.9* 31.0* 30.9*  PLT 264 289 281   BMET  Recent Labs  10/29/14 0222 10/30/14 0503 10/30/14 2054  NA 138 136* 134*  K 3.9 4.5 4.9  CL 97 99 95*  CO2 27 22 26   GLUCOSE 247* 250* 323*  BUN 23 30* 32*  CREATININE 0.69 0.93 1.03  CALCIUM 9.7 9.0 8.9   PT/INR  Recent Labs  10/29/14 0330  LABPROT 15.1  INR 1.18   Cardiac Panel (last 3 results)  Recent Labs  10/29/14 2306 10/30/14 2054 10/31/14 0235  TROPONINI 2.72* 1.10* 1.07*   Lipid Panel     Component Value Date/Time   CHOL 258* 07/26/2013 1242   TRIG 232* 07/26/2013 1242   HDL 58 07/26/2013 1242   CHOLHDL 4.4 07/26/2013 1242   VLDL 46* 07/26/2013 1242   LDLCALC 154* 07/26/2013 1242      Assessment/Plan  Active Problems:   NSTEMI (non-ST elevated myocardial infarction) Troponin peak  3.89 and trending down.  Some chest pain yesterday- now resolved. Echo shows inferior wall hypokinesis. Plan to proceed with cardiac cath today. Patient on ASA and Plavix prior to admit for PAD as well as Xarelto for AFib. Depending on need for PCI may consider stopping ASA.    Fever- resolved. Flu swab negative. So far cultures negative. UA clear. CXR clear.   Leukocytosis       COPD  On xopenex nebs since she is tachycardic      HLD  Elevated.  On lipitor    Tobacco abuse    PAF- Now in Afib with RVR. Possibly triggered by ischemic event. Loading with IV  amiodarone. On IV cardizem at 10  mg/hr. Will rebolus with 10 mg and increase infusion to 15 mg /hr.   Xarelto held for cath    Obesity    Diabetes mellitus- poorly controlled. Will increase lantus to 42 units qam. Add Novolog 4 mg tid WC. Continue SSI.    LOS: 2 days    Signed:  Mozetta Murfin [place], [place] 10/31/2014 7:30 AM

## 2014-10-31 NOTE — Progress Notes (Addendum)
Paged by nursing staff as patient had a 6.45 sec pause follow by conversion to NSR. Had cath via R radial artery earlier today for NSTEMI which shows likely demand ischemia in the setting of a-fib with RVR. Telemetry reviewed, noted a-fib following by 6.45 sec post termination pause at 3:35pm, follow by slow junctional escape beat then quickly transitioned to NSR. HR 80s. Patient is stable, alert and oriented, states had some funny sensation during the episode. IV diltiazem decreased to 5 to avoid hypotension and bradycardia.   Will continue to observe for now. Likely restart Xarelto soon. Diffuse small vessel disease on cath, however likely does not need ASA. Monitor overnight, by 10pm tonight will finished 24 hrs loading of amiodarone, plan to switch to PO amiodarone tomorrow. Continue IV diltiazem for today, previously on coreg for LV dysfunction, however may consider switch to metoprolol XL for better rate control and LV dysfunction depend on BP.   Hilbert Corrigan PA Pager: [phone]  Will hold off on restarting Xarelto for now in case significant sinus pause recur overnight which may means pt need pacemaker. Nicotine patches given has patient seen jittery without smoke. Low dose PRN Xanax as needed.   Hilbert Corrigan PA Pager: [phone]

## 2014-10-31 NOTE — Progress Notes (Signed)
ANTICOAGULATION CONSULT NOTE  Pharmacy Consult for heparin  Indication: chest pain/ACS  No Known Allergies  Patient Measurements: Height: 5\' 4"  (162.6 cm) Weight: 228 lb 11.2 oz (103.738 kg) IBW/kg (Calculated) : 54.7 Heparin Dosing Weight: 78kg  Vital Signs: Temp: 98.7 F (37.1 C) (12/18 0800) Temp Source: Oral (12/18 0800) BP: 102/57 mmHg (12/18 1145) Pulse Rate: 86 (12/18 1129)  Labs:  Recent Labs  10/29/14 0222 10/29/14 0330  10/29/14 2100  10/30/14 0503 10/30/14 2054 10/31/14 0235 10/31/14 0715  HGB 11.2*  --   --   --   --  9.9* 9.9* 9.8*  --   HCT 34.7*  --   --   --   --  30.9* 31.0* 30.9*  --   PLT 305  --   --   --   --  264 289 281  --   APTT  --   --   < > 39*  --  47*  --  49*  --   LABPROT  --  15.1  --   --   --   --   --   --   --   INR  --  1.18  --   --   --   --   --   --   --   HEPARINUNFRC  --   --   < > 0.27*  --  0.40  --  0.22*  --   CREATININE 0.69  --   --   --   --  0.93 1.03  --   --   TROPONINI  --   --   < >  --   < >  --  1.10* 1.07* 0.78*  < > = values in this interval not displayed.  Estimated Creatinine Clearance: 74.9 mL/min (by C-G formula based on Cr of 1.03).  Assessment [name] started on heparin for ACS. PTA she is on Xarelto- last documented dose 12/15 PM.  Heparin levels and aPTTs are correlating and can now use only heparin levels. She is s/p cath this morning and is to resume heparin 6 hours post TR band removal. Per RN Charlett Nose, deflation going well and band will be off at 1300 today. Last heparin level was low and rate was increased to 1800 units/hr. A level was not obtained on this rate as patient went to cath. No bleeding noted, hgb low but stable, plts wnl.  Goal of Therapy:  Heparin level 0.3-0.7 units/ml Monitor platelets by anticoagulation protocol: Yes   Plan:  1. Resume heparin 1800 units/hr at 1900 tonight 2. Heparin level at 0100 tomorrow morning 3. Daily heparin level and CBC 4. Follow for s/s bleeding and  plans for TEE as per cath note  Elchonon Maxson D. Nat Lowenthal, PharmD, BCPS Clinical Pharmacist Pager: [phone] 10/31/2014 12:51 PM

## 2014-10-31 NOTE — Progress Notes (Signed)
Patient had 6.44 sec sinus arrest on telemetry, then converted to normal sinus rythym.  Patient in bed stated she just had a weird feeling, but feels fine at this time, a little restless.  On IV Cardizem at 15mg /hr and Amiodarone at 16.7 ml/hr (30 mg/hr).  Cardizem drip titrated down to 5 mg/hr.  Maintaining sinus with rates 70-80's.  BP 108/63.  Almyra Deforest paged and notified and up to see patient.  Primary nurse Charlett Nose updated.  Will continue to monitor.  Sanda Linger

## 2014-10-31 NOTE — H&P (View-Only) (Signed)
Subjective: No chest pain currently. She did have chest pain yesterday. No SOB, cough, abd. Pain. Denies palpitations.  Objective: Vital signs in last 24 hours: Temp:  [98.3 F (36.8 C)-99.2 F (37.3 C)] 98.9 F (37.2 C) (12/18 0603) Pulse Rate:  [107-153] 135 (12/18 0603) Resp:  [20-28] 28 (12/18 0603) BP: (101-158)/(58-145) 113/91 mmHg (12/18 0603) SpO2:  [90 %-100 %] 95 % (12/18 0603) Weight:  [228 lb 11.2 oz (103.738 kg)] 228 lb 11.2 oz (103.738 kg) (12/18 0603) Last BM Date: 10/28/14  Intake/Output from previous day: 12/17 0701 - 12/18 0700 In: -  Out: 500 [Urine:500] Intake/Output this shift:   Telemetry: Atrial fibrillation with RVR. Rate 120.  Medications Current Facility-Administered Medications  Medication Dose Route Frequency Provider Last Rate Last Dose  . 0.9 %  sodium chloride infusion   Intravenous Continuous Thayer Headings, MD 100 mL/hr at 10/31/14 [phone]    . 0.9 %  sodium chloride infusion  250 mL Intravenous PRN Thayer Headings, MD      . acetaminophen (TYLENOL) tablet 650 mg  650 mg Oral Q4H PRN Cletus Gash, MD   650 mg at 10/30/14 0931  . amiodarone (NEXTERONE PREMIX) 360 MG/200ML (1.8 mg/mL) IV infusion  30 mg/hr Intravenous Continuous Skeet Latch, MD 16.7 mL/hr at 10/31/14 0509 30 mg/hr at 10/31/14 0509  . amiodarone (NEXTERONE PREMIX) 360 MG/200ML (1.8 mg/mL) IV infusion           . aspirin EC tablet 81 mg  81 mg Oral Daily Thayer Headings, MD   81 mg at 10/30/14 0930  . atorvastatin (LIPITOR) tablet 40 mg  40 mg Oral q1800 Thayer Headings, MD   40 mg at 10/30/14 1722  . carvedilol (COREG) tablet 6.25 mg  6.25 mg Oral BID WC Thayer Headings, MD   6.25 mg at 10/30/14 1722  . clopidogrel (PLAVIX) tablet 75 mg  75 mg Oral Q breakfast Thayer Headings, MD   75 mg at 10/30/14 [id number]  . diltiazem (CARDIZEM) 1 mg/mL load via infusion 10 mg  10 mg Intravenous Once Emmanuelle Hibbitts M [place], MD       And  . diltiazem (CARDIZEM) 100 mg in dextrose 5 % 100 mL (1  mg/mL) infusion  5-15 mg/hr Intravenous Continuous Abryana Lykens M [place], MD      . gabapentin (NEURONTIN) capsule 300 mg  300 mg Oral BID Thayer Headings, MD   300 mg at 10/30/14 2231  . glipiZIDE (GLUCOTROL XL) 24 hr tablet 30 mg  30 mg Oral Q breakfast Thayer Headings, MD   30 mg at 10/30/14 0747  . heparin ADULT infusion 100 units/mL (25000 units/250 mL)  1,800 Units/hr Intravenous Continuous Rogue Bussing, [facility] 18 mL/hr at 10/31/14 [id number] 1,800 Units/hr at 10/31/14 [id number]  . insulin aspart (novoLOG) injection 0-9 Units  0-9 Units Subcutaneous TID WC Dayna N Dunn, PA-C   5 Units at 10/30/14 1722  . insulin aspart (novoLOG) injection 4 Units  4 Units Subcutaneous TID WC Kyliee Ortego M [place], MD      . insulin glargine (LANTUS) injection 42 Units  42 Units Subcutaneous QHS Marckus Hanover M [place], MD      . irbesartan (AVAPRO) tablet 75 mg  75 mg Oral Daily Thayer Headings, MD   75 mg at 10/30/14 0931  . isosorbide mononitrate (IMDUR) 24 hr tablet 30 mg  30 mg Oral Daily Thayer Headings, MD   30 mg at 10/30/14 0931  . levalbuterol (XOPENEX)  nebulizer solution 0.63 mg  0.63 mg Nebulization Q8H PRN Brett Canales, PA-C      . levothyroxine (SYNTHROID, LEVOTHROID) tablet 50 mcg  50 mcg Oral QAC breakfast Thayer Headings, MD   50 mcg at 10/31/14 [id number]  . LORazepam (ATIVAN) tablet 1 mg  1 mg Oral Once Skeet Latch, MD      . metoprolol (LOPRESSOR) 1 MG/ML injection           . multivitamin with minerals tablet 1 tablet  1 tablet Oral Daily Josue Hector, MD   1 tablet at 10/30/14 [phone]  . nitroGLYCERIN (NITROSTAT) SL tablet 0.4 mg  0.4 mg Sublingual Q5 Min x 3 PRN Thayer Headings, MD      . ondansetron [facility]) injection 4 mg  4 mg Intravenous Q6H PRN Thayer Headings, MD      . oxyCODONE (Oxy IR/ROXICODONE) immediate release tablet 5 mg  5 mg Oral Q8H PRN Thayer Headings, MD   5 mg at 10/30/14 1552  . silver sulfADIAZINE (SILVADENE) 1 % cream 1 application  1 application Topical Daily PRN Thayer Headings, MD      .  sodium chloride 0.9 % injection 3 mL  3 mL Intravenous Q12H Thayer Headings, MD   3 mL at 10/30/14 2231  . sodium chloride 0.9 % injection 3 mL  3 mL Intravenous PRN Thayer Headings, MD        PE: General appearance: alert, cooperative and no distress. Obese.  Lungs: Scant left basilar wheeze. Heart: IRR. Rate elevated.  No Murmur or gallop Extremities: No LEE Pulses: 2+ and symmetric Skin: Warm and dry Neurologic: Grossly normal  Lab Results:   Recent Labs  10/30/14 0503 10/30/14 2054 10/31/14 0235  WBC 15.9* 13.2* 14.1*  HGB 9.9* 9.9* 9.8*  HCT 30.9* 31.0* 30.9*  PLT 264 289 281   BMET  Recent Labs  10/29/14 0222 10/30/14 0503 10/30/14 2054  NA 138 136* 134*  K 3.9 4.5 4.9  CL 97 99 95*  CO2 27 22 26   GLUCOSE 247* 250* 323*  BUN 23 30* 32*  CREATININE 0.69 0.93 1.03  CALCIUM 9.7 9.0 8.9   PT/INR  Recent Labs  10/29/14 0330  LABPROT 15.1  INR 1.18   Cardiac Panel (last 3 results)  Recent Labs  10/29/14 2306 10/30/14 2054 10/31/14 0235  TROPONINI 2.72* 1.10* 1.07*   Lipid Panel     Component Value Date/Time   CHOL 258* 07/26/2013 1242   TRIG 232* 07/26/2013 1242   HDL 58 07/26/2013 1242   CHOLHDL 4.4 07/26/2013 1242   VLDL 46* 07/26/2013 1242   LDLCALC 154* 07/26/2013 1242      Assessment/Plan  Active Problems:   NSTEMI (non-ST elevated myocardial infarction) Troponin peak  3.89 and trending down.  Some chest pain yesterday- now resolved. Echo shows inferior wall hypokinesis. Plan to proceed with cardiac cath today. Patient on ASA and Plavix prior to admit for PAD as well as Xarelto for AFib. Depending on need for PCI may consider stopping ASA.    Fever- resolved. Flu swab negative. So far cultures negative. UA clear. CXR clear.   Leukocytosis       COPD  On xopenex nebs since she is tachycardic      HLD  Elevated.  On lipitor    Tobacco abuse    PAF- Now in Afib with RVR. Possibly triggered by ischemic event. Loading with IV  amiodarone. On IV cardizem at 10  mg/hr. Will rebolus with 10 mg and increase infusion to 15 mg /hr.   Xarelto held for cath    Obesity    Diabetes mellitus- poorly controlled. Will increase lantus to 42 units qam. Add Novolog 4 mg tid WC. Continue SSI.    LOS: 2 days    Signed:  Orie Cuttino [place], [place] 10/31/2014 7:30 AM

## 2014-10-31 NOTE — CV Procedure (Signed)
      Cardiac Catheterization Operative Report  DONIESHA RHINES [id number] 12/18/20159:29 AM Angelica Chessman, MD  Procedure Performed:  1. Left Heart Catheterization 2. Selective Coronary Angiography 3. Left ventricular angiogram  Operator: Lauree Chandler, MD  Arterial access site:  Right radial artery.   Indication: 53 yo female with DM, tobacco abuse, atrial fib admitted with atrial fib with RVR. Troponin elevated.                                     Procedure Details: The risks, benefits, complications, treatment options, and expected outcomes were discussed with the patient. The patient and/or family concurred with the proposed plan, giving informed consent. The patient was brought to the cath lab after IV hydration was begun and oral premedication was given. The patient was further sedated with Versed and Fentanyl. The right wrist was assessed with a modified Allens test which was positive. The right wrist was prepped and draped in a sterile fashion. 1% lidocaine was used for local anesthesia. Using the modified Seldinger access technique, a 5 French sheath was placed in the right radial artery. 3 mg Verapamil was given through the sheath. 5000 units IV heparin was given. Standard diagnostic catheters were used to perform selective coronary angiography. A pigtail catheter was used to perform a left ventricular angiogram. The sheath was removed from the right radial artery and a Terumo hemostasis band was applied at the arteriotomy site on the right wrist.   There were no immediate complications. The patient was taken to the recovery area in stable condition.   Hemodynamic Findings: Central aortic pressure: 112/74 Left ventricular pressure: 104/14/21  Angiographic Findings:  Left main: No obstructive disease.   Left Anterior Descending Artery: Large caliber vessel that courses to the apex. The mid vessel has mild diffuse plaque. The first diagonal branch is a small caliber  vessel with ostial 30% stenosis. The second diagonal branch is a small to moderate caliber vessel with distal 50% stenosis.   Circumflex Artery: Large caliber vessel with 30% mid stenosis. The first obtuse marginal branch is a small caliber vessel (1.5 mm) with diffuse 80% stenosis in the proximal and mid segment. The second obtuse marginal branch is small in caliber with 40% mid stenosis. The left posterolateral branch has mild diffuse plaque.   Right Coronary Artery: Small caliber non-dominant vessel with mid 90% stenosis.   Left Ventricular Angiogram: LVEF=30-35% with severe hypokinesis fo the anterior wall and apex, inferoapex.   Impression: 1. Rapid atrial fibrillation during case 2. Moderately reduced LV systolic function 3. Diffuse small vessel disease including the small non-dominant RCA and small caliber first obtuse marginal branch 4. Elevated troponin likely due to demand ischemia with underlying small vessel CAD and rapid atrial fib  Recommendations: Medical management of CAD. As above, her elevated troponin is likely related to demand ischemia with small vessel CAD and rapid atrial fib. Rate is still poorly controlled on IV amiodarone and IV diltiazem. May need TEE guided cardioversion.        Complications:  None. The patient tolerated the procedure well.

## 2014-10-31 NOTE — Interval H&P Note (Signed)
History and Physical Interval Note:  10/31/2014 8:56 AM  [name]  has presented today for cardiac cath with the diagnosis of NSTEMI.  The various methods of treatment have been discussed with the patient and family. After consideration of risks, benefits and other options for treatment, the patient has consented to  Procedure(s): LEFT HEART CATHETERIZATION WITH CORONARY ANGIOGRAM (N/A) as a surgical intervention .  The patient's history has been reviewed, patient examined, no change in status, stable for surgery.  I have reviewed the patient's chart and labs.  Questions were answered to the patient's satisfaction.    Cath Lab Visit (complete for each Cath Lab visit)  Clinical Evaluation Leading to the Procedure:   ACS: Yes.    Non-ACS:    Anginal Classification: CCS IV  Anti-ischemic medical therapy: Maximal Therapy (2 or more classes of medications)  Non-Invasive Test Results: No non-invasive testing performed  Prior CABG: No previous CABG         MCALHANY,CHRISTOPHER

## 2014-10-31 NOTE — Progress Notes (Signed)
ANTICOAGULATION CONSULT NOTE - Follow Up Consult  Pharmacy Consult for heparin Indication: NSTEMI  Labs:  Recent Labs  10/29/14 0222 10/29/14 0330  10/29/14 2100 10/29/14 2306 10/30/14 0503 10/30/14 2054 10/31/14 0235  HGB 11.2*  --   --   --   --  9.9* 9.9* 9.8*  HCT 34.7*  --   --   --   --  30.9* 31.0* 30.9*  PLT 305  --   --   --   --  264 289 281  APTT  --   --   < > 39*  --  47*  --  49*  LABPROT  --  15.1  --   --   --   --   --   --   INR  --  1.18  --   --   --   --   --   --   HEPARINUNFRC  --   --   < > 0.27*  --  0.40  --  0.22*  CREATININE 0.69  --   --   --   --  0.93 1.03  --   TROPONINI  --   --   < >  --  2.72*  --  1.10* 1.07*  < > = values in this interval not displayed.   Assessment: 53yo female now subtherapeutic on heparin after one level at goal; cath canceled yesterday, on schedule for this am.  Goal of Therapy:  Heparin level 0.3-0.7 units/ml   Plan:  Will increase heparin gtt by 2 units/kg/hr to 1800 units/hr and check level in 6hr vs f/u after cath.  Wynona Neat, PharmD, BCPS  10/31/2014,4:35 AM

## 2014-11-01 LAB — HEPARIN LEVEL (UNFRACTIONATED)
Heparin Unfractionated: 0.32 IU/mL (ref 0.30–0.70)
Heparin Unfractionated: 0.44 IU/mL (ref 0.30–0.70)

## 2014-11-01 LAB — URINE CULTURE
Colony Count: 60000
SPECIAL REQUESTS: NORMAL

## 2014-11-01 LAB — CBC
HCT: 28.8 % — ABNORMAL LOW (ref 36.0–46.0)
Hemoglobin: 9.3 g/dL — ABNORMAL LOW (ref 12.0–15.0)
MCH: 28.7 pg (ref 26.0–34.0)
MCHC: 32.3 g/dL (ref 30.0–36.0)
MCV: 88.9 fL (ref 78.0–100.0)
PLATELETS: 245 10*3/uL (ref 150–400)
RBC: 3.24 MIL/uL — AB (ref 3.87–5.11)
RDW: 14.6 % (ref 11.5–15.5)
WBC: 14.5 10*3/uL — ABNORMAL HIGH (ref 4.0–10.5)

## 2014-11-01 LAB — GLUCOSE, CAPILLARY
GLUCOSE-CAPILLARY: 390 mg/dL — AB (ref 70–99)
GLUCOSE-CAPILLARY: 278 mg/dL — AB (ref 70–99)
Glucose-Capillary: 246 mg/dL — ABNORMAL HIGH (ref 70–99)
Glucose-Capillary: 297 mg/dL — ABNORMAL HIGH (ref 70–99)

## 2014-11-01 LAB — APTT: aPTT: 52 seconds — ABNORMAL HIGH (ref 24–37)

## 2014-11-01 MED ORDER — RIVAROXABAN 20 MG PO TABS
20.0000 mg | ORAL_TABLET | Freq: Every day | ORAL | Status: DC
  Administered 2014-11-01 – 2014-11-06 (×6): 20 mg via ORAL
  Filled 2014-11-01 (×6): qty 1

## 2014-11-01 MED ORDER — IRBESARTAN 150 MG PO TABS
150.0000 mg | ORAL_TABLET | Freq: Every day | ORAL | Status: DC
  Administered 2014-11-01 – 2014-11-06 (×6): 150 mg via ORAL
  Filled 2014-11-01 (×6): qty 1

## 2014-11-01 MED ORDER — ASPIRIN 81 MG PO CHEW
81.0000 mg | CHEWABLE_TABLET | Freq: Once | ORAL | Status: AC
  Administered 2014-11-01: 81 mg via ORAL
  Filled 2014-11-01: qty 1

## 2014-11-01 MED ORDER — CARVEDILOL 6.25 MG PO TABS
6.2500 mg | ORAL_TABLET | Freq: Two times a day (BID) | ORAL | Status: DC
  Administered 2014-11-01 – 2014-11-06 (×10): 6.25 mg via ORAL
  Filled 2014-11-01 (×10): qty 1

## 2014-11-01 MED ORDER — INSULIN NPH (HUMAN) (ISOPHANE) 100 UNIT/ML ~~LOC~~ SUSP
40.0000 [IU] | Freq: Every day | SUBCUTANEOUS | Status: DC
  Administered 2014-11-01 – 2014-11-05 (×5): 40 [IU] via SUBCUTANEOUS
  Filled 2014-11-01: qty 10

## 2014-11-01 MED ORDER — AMIODARONE HCL IN DEXTROSE 360-4.14 MG/200ML-% IV SOLN: 30.0000 mg/h | INTRAVENOUS | Status: DC

## 2014-11-01 MED ORDER — GLIPIZIDE ER 10 MG PO TB24
10.0000 mg | ORAL_TABLET | Freq: Every day | ORAL | Status: DC
  Administered 2014-11-02 – 2014-11-06 (×5): 10 mg via ORAL
  Filled 2014-11-01 (×6): qty 1

## 2014-11-01 MED ORDER — LEVOTHYROXINE SODIUM 50 MCG PO TABS
50.0000 ug | ORAL_TABLET | Freq: Every day | ORAL | Status: DC
  Administered 2014-11-02 – 2014-11-06 (×5): 50 ug via ORAL
  Filled 2014-11-01 (×5): qty 1

## 2014-11-01 MED ORDER — CLOPIDOGREL BISULFATE 75 MG PO TABS
75.0000 mg | ORAL_TABLET | Freq: Every day | ORAL | Status: DC
  Administered 2014-11-01 – 2014-11-02 (×2): 75 mg via ORAL
  Filled 2014-11-01 (×2): qty 1

## 2014-11-01 MED ORDER — FUROSEMIDE 40 MG PO TABS
40.0000 mg | ORAL_TABLET | Freq: Every day | ORAL | Status: DC
  Administered 2014-11-01 – 2014-11-03 (×3): 40 mg via ORAL
  Filled 2014-11-01 (×4): qty 1

## 2014-11-01 MED ORDER — AMIODARONE HCL 200 MG PO TABS
400.0000 mg | ORAL_TABLET | Freq: Every day | ORAL | Status: DC
  Administered 2014-11-01 – 2014-11-02 (×2): 400 mg via ORAL
  Filled 2014-11-01 (×2): qty 2

## 2014-11-01 NOTE — Progress Notes (Signed)
ANTICOAGULATION CONSULT NOTE - Follow Up Consult  Pharmacy Consult for Heparin Indication: atrial fibrillation  No Known Allergies  Patient Measurements: Height: 5\' 4"  (162.6 cm) Weight: 228 lb 6.3 oz (103.6 kg) IBW/kg (Calculated) : 54.7 Heparin Dosing Weight: 79 kg  Vital Signs: Temp: 98.3 F (36.8 C) (12/19 0424) Temp Source: Oral (12/19 0424) BP: 140/97 mmHg (12/19 0823) Pulse Rate: 73 (12/19 0823)  Labs:  Recent Labs  10/30/14 0503 10/30/14 2054 10/31/14 0235 10/31/14 0715 11/01/14 0040  HGB 9.9* 9.9* 9.8*  --  9.3*  HCT 30.9* 31.0* 30.9*  --  28.8*  PLT 264 289 281  --  245  APTT 47*  --  49*  --  52*  HEPARINUNFRC 0.40  --  0.22*  --  0.32  CREATININE 0.93 1.03  --   --   --   TROPONINI  --  1.10* 1.07* 0.78*  --     Estimated Creatinine Clearance: 74.9 mL/min (by C-G formula based on Cr of 1.03).   Medications:  Scheduled:  . gabapentin  300 mg Oral BID  . insulin aspart  0-9 Units Subcutaneous TID WC  . insulin aspart  4 Units Subcutaneous TID WC  . LORazepam  1 mg Oral Once  . multivitamin with minerals  1 tablet Oral Daily  . nicotine  21 mg Transdermal Daily   Infusions:  . amiodarone 30 mg/hr (10/31/14 2221)  . diltiazem (CARDIZEM) infusion 5 mg/hr (10/31/14 1550)  . heparin 1,800 Units/hr (10/31/14 1952)    Assessment: 12 yof admitted 10/29/2014 presenting with CP and SOB for evaluation of CP and NSTEMI.  Pharmacy was consulted to dose heparin for ACS/Afib.    Patient was on xarelto PTA for afib (now NSR) - last dose 12/15. Heparin level and aPTTs are correlating.  Heparin level is therapeutic at 0.44.  Holding off restarting xarelto in case significant sinus pause recurs, considering pacemaker.  Hgb low but stable, plts wnl, no bleeding noted.    Nephrology: SCr 0.69 > 1.03, CrCl ~61mL/min, lytes wnl.   Goal of Therapy:  Heparin level 0.3-0.7 units/ml Monitor platelets by anticoagulation protocol: Yes   Plan:  - Continue heparin 1800  units/h - Follow daily heparin level and CBC - Monitor for signs and symptoms of bleeding.    Hassie Bruce, Pharm. D. Clinical Pharmacy Resident Pager: [phone] Ph: [phone] 11/01/2014 9:01 AM

## 2014-11-01 NOTE — Progress Notes (Signed)
SUBJECTIVE:  Leg pain.  No chest pain  OBJECTIVE:   Vitals:   Filed Vitals:   10/31/14 2145 10/31/14 2345 11/01/14 0424 11/01/14 0823  BP: 132/90 125/92 119/65 140/97  Pulse: 102 101 102 73  Temp:  99.2 F (37.3 C) 98.3 F (36.8 C)   TempSrc:  Oral Oral   Resp: 25 24 23 24   Height:      Weight:   228 lb 6.3 oz (103.6 kg)   SpO2: 99% 96% 97% 89%   I&O's:   Intake/Output Summary (Last 24 hours) at 11/01/14 1202 Last data filed at 11/01/14 1137  Gross per 24 hour  Intake   1370 ml  Output   1800 ml  Net   -430 ml   TELEMETRY: Reviewed telemetry pt in NSR with PACs, sinus tach:   PHYSICAL EXAM General: Well developed, well nourished, in no acute distress Head:   Normal cephalic and atramatic  Lungs:  Bilateral wheezing Heart:  HRRR S1 S2  No JVD.   Abdomen: abdomen soft and non-tender Msk:  Back normal,  Normal strength and tone for age. Extremities:  Right radial site intact, palpable pulse,  No edema.   Neuro: Alert and oriented. Psych:  Normal affect, responds appropriately Skin: No rash   LABS: Basic Metabolic Panel:  Recent Labs  10/30/14 0503 10/30/14 2054  NA 136* 134*  K 4.5 4.9  CL 99 95*  CO2 22 26  GLUCOSE 250* 323*  BUN 30* 32*  CREATININE 0.93 1.03  CALCIUM 9.0 8.9  MG  --  1.8   Liver Function Tests:  Recent Labs  10/30/14 2054  AST 39*  ALT 34  ALKPHOS 188*  BILITOT <0.2*  PROT 6.6  ALBUMIN 2.9*   No results for input(s): LIPASE, AMYLASE in the last 72 hours. CBC:  Recent Labs  10/31/14 0235 11/01/14 0040  WBC 14.1* 14.5*  HGB 9.8* 9.3*  HCT 30.9* 28.8*  MCV 89.0 88.9  PLT 281 245   Cardiac Enzymes:  Recent Labs  10/30/14 2054 10/31/14 0235 10/31/14 0715  TROPONINI 1.10* 1.07* 0.78*   BNP: Invalid input(s): POCBNP D-Dimer: No results for input(s): DDIMER in the last 72 hours. Hemoglobin A1C: No results for input(s): HGBA1C in the last 72 hours. Fasting Lipid Panel: No results for input(s): CHOL, HDL,  LDLCALC, TRIG, CHOLHDL, LDLDIRECT in the last 72 hours. Thyroid Function Tests: No results for input(s): TSH, T4TOTAL, T3FREE, THYROIDAB in the last 72 hours.  Invalid input(s): FREET3 Anemia Panel: No results for input(s): VITAMINB12, FOLATE, FERRITIN, TIBC, IRON, RETICCTPCT in the last 72 hours. Coag Panel:   Lab Results  Component Value Date   INR 1.18 10/29/2014   INR 2.04* 09/07/2014   INR 1.12 09/10/2013    RADIOLOGY: Dg Chest 2 View  10/29/2014   CLINICAL DATA:  Initial evaluation for are chest pain.  EXAM: CHEST  2 VIEW  COMPARISON:  Prior study from 10/13/2014  FINDINGS: Cardiac and mediastinal silhouettes are stable in size and contour, and remain within normal limits.  Lungs are mildly hypoinflated. There is mild diffuse prominence of the interstitial markings, similar to prior, which may related to history of smoking. No consolidative airspace disease. No pulmonary edema or pleural effusion. No pneumothorax.  No acute osseous abnormality. Accentuation of the normal thoracic kyphosis noted.  IMPRESSION: No active cardiopulmonary disease.   Electronically Signed   By: Jeannine Boga M.D.   On: 10/29/2014 04:14      ASSESSMENT: / PLAN:  1) Switch heparin to Xarelto.  Stop aspirin since no PCI done.  COntinue PLavix given PAD. 2) Continue Amio load.  Switch to PO Amio when bag runs out.  Currently off diltiazem. Add back as needed for BP and HR. 3) Wean oxygen. She does not wear oxygen at home. 4) NSTEMI: No PCI.  Need to maintain NSR to avoid demand ischemia.    Jettie Booze, MD  11/01/2014  12:02 PM

## 2014-11-01 NOTE — Progress Notes (Signed)
ANTICOAGULATION CONSULT NOTE - Follow Up Consult  Pharmacy Consult for heparin Indication: atrial fibrillation  Labs:  Recent Labs  10/29/14 0222 10/29/14 0330  10/30/14 0503 10/30/14 2054 10/31/14 0235 10/31/14 0715 11/01/14 0040  HGB 11.2*  --   --  9.9* 9.9* 9.8*  --  9.3*  HCT 34.7*  --   --  30.9* 31.0* 30.9*  --  28.8*  PLT 305  --   --  264 289 281  --  245  APTT  --   --   < > 47*  --  49*  --  52*  LABPROT  --  15.1  --   --   --   --   --   --   INR  --  1.18  --   --   --   --   --   --   HEPARINUNFRC  --   --   < > 0.40  --  0.22*  --  0.32  CREATININE 0.69  --   --  0.93 1.03  --   --   --   TROPONINI  --   --   < >  --  1.10* 1.07* 0.78*  --   < > = values in this interval not displayed.   Assessment/Plan:  53yo female therapeutic on heparin after resumed post-cath; level at low end of goal and PTT a little low but gtt started late so many continue to accumulate. Will continue gtt at current rate and confirm stable with additional level.   Wynona Neat, PharmD, BCPS  11/01/2014,1:24 AM

## 2014-11-01 NOTE — Discharge Instructions (Signed)
Information on my medicine - XARELTO (Rivaroxaban)  This medication education was reviewed with me or my healthcare representative as part of my discharge preparation.  The pharmacist that spoke with me during my hospital stay was:  Blossom Hoops, [facility]  Why was Xarelto prescribed for you? Xarelto was prescribed for you to reduce the risk of a blood clot forming that can cause a stroke if you have a medical condition called atrial fibrillation (a type of irregular heartbeat).  What do you need to know about xarelto ? Take your Xarelto ONCE DAILY at the same time every day with your evening meal. If you have difficulty swallowing the tablet whole, you may crush it and mix in applesauce just prior to taking your dose.  Take Xarelto exactly as prescribed by your doctor and DO NOT stop taking Xarelto without talking to the doctor who prescribed the medication.  Stopping without other stroke prevention medication to take the place of Xarelto may increase your risk of developing a clot that causes a stroke.  Refill your prescription before you run out.  After discharge, you should have regular check-up appointments with your healthcare provider that is prescribing your Xarelto.  In the future your dose may need to be changed if your kidney function or weight changes by a significant amount.  What do you do if you miss a dose? If you are taking Xarelto ONCE DAILY and you miss a dose, take it as soon as you remember on the same day then continue your regularly scheduled once daily regimen the next day. Do not take two doses of Xarelto at the same time or on the same day.   Important Safety Information A possible side effect of Xarelto is bleeding. You should call your healthcare provider right away if you experience any of the following: ? Bleeding from an injury or your nose that does not stop. ? Unusual colored urine (red or dark brown) or unusual colored stools (red or black). ? Unusual  bruising for unknown reasons. ? A serious fall or if you hit your head (even if there is no bleeding).  Some medicines may interact with Xarelto and might increase your risk of bleeding while on Xarelto. To help avoid this, consult your healthcare provider or pharmacist prior to using any new prescription or non-prescription medications, including herbals, vitamins, non-steroidal anti-inflammatory drugs (NSAIDs) and supplements.  This website has more information on Xarelto: [email or web address].[email or web address]

## 2014-11-02 ENCOUNTER — Inpatient Hospital Stay (HOSPITAL_COMMUNITY): Payer: Medicaid Other

## 2014-11-02 LAB — CBC
HCT: 27.7 % — ABNORMAL LOW (ref 36.0–46.0)
Hemoglobin: 8.8 g/dL — ABNORMAL LOW (ref 12.0–15.0)
MCH: 29.5 pg (ref 26.0–34.0)
MCHC: 31.8 g/dL (ref 30.0–36.0)
MCV: 93 fL (ref 78.0–100.0)
Platelets: 216 10*3/uL (ref 150–400)
RBC: 2.98 MIL/uL — AB (ref 3.87–5.11)
RDW: 15 % (ref 11.5–15.5)
WBC: 16.8 10*3/uL — AB (ref 4.0–10.5)

## 2014-11-02 LAB — URINALYSIS, ROUTINE W REFLEX MICROSCOPIC
Bilirubin Urine: NEGATIVE
Glucose, UA: NEGATIVE mg/dL
Hgb urine dipstick: NEGATIVE
Ketones, ur: NEGATIVE mg/dL
Leukocytes, UA: NEGATIVE
NITRITE: NEGATIVE
Protein, ur: 30 mg/dL — AB
SPECIFIC GRAVITY, URINE: 1.018 (ref 1.005–1.030)
UROBILINOGEN UA: 0.2 mg/dL (ref 0.0–1.0)
pH: 5 (ref 5.0–8.0)

## 2014-11-02 LAB — URINE MICROSCOPIC-ADD ON

## 2014-11-02 LAB — GLUCOSE, CAPILLARY
GLUCOSE-CAPILLARY: 306 mg/dL — AB (ref 70–99)
Glucose-Capillary: 196 mg/dL — ABNORMAL HIGH (ref 70–99)
Glucose-Capillary: 315 mg/dL — ABNORMAL HIGH (ref 70–99)
Glucose-Capillary: 383 mg/dL — ABNORMAL HIGH (ref 70–99)

## 2014-11-02 MED ORDER — METOPROLOL TARTRATE 1 MG/ML IV SOLN: 2.5000 mg | Freq: Once | INTRAVENOUS | Status: DC

## 2014-11-02 MED ORDER — METOPROLOL TARTRATE 1 MG/ML IV SOLN: 5.0000 mg | Freq: Once | INTRAVENOUS | Status: AC

## 2014-11-02 MED ORDER — AMIODARONE HCL 200 MG PO TABS: 400.0000 mg | ORAL_TABLET | Freq: Two times a day (BID) | ORAL | Status: DC

## 2014-11-02 MED ORDER — AMIODARONE HCL IN DEXTROSE 360-4.14 MG/200ML-% IV SOLN
60.0000 mg/h | INTRAVENOUS | Status: AC
  Administered 2014-11-02: 60 mg/h via INTRAVENOUS
  Filled 2014-11-02: qty 200

## 2014-11-02 MED ORDER — SODIUM CHLORIDE 0.9 % IV BOLUS (SEPSIS)
500.0000 mL | INTRAVENOUS | Status: AC
  Administered 2014-11-02: 500 mL via INTRAVENOUS

## 2014-11-02 MED ORDER — ALUM & MAG HYDROXIDE-SIMETH 200-200-20 MG/5ML PO SUSP
15.0000 mL | ORAL | Status: DC | PRN
  Administered 2014-11-02 – 2014-11-04 (×2): 15 mL via ORAL
  Filled 2014-11-02 (×2): qty 30

## 2014-11-02 MED ORDER — METOPROLOL TARTRATE 1 MG/ML IV SOLN
INTRAVENOUS | Status: AC
  Administered 2014-11-02: 2.5 mg
  Filled 2014-11-02: qty 5

## 2014-11-02 MED ORDER — AMIODARONE HCL IN DEXTROSE 360-4.14 MG/200ML-% IV SOLN
30.0000 mg/h | INTRAVENOUS | Status: DC
  Administered 2014-11-02: 30 mg/h via INTRAVENOUS
  Filled 2014-11-02 (×2): qty 200

## 2014-11-02 NOTE — Progress Notes (Signed)
Pt noted to be back in NSR. According to CCMD, pt converted around 1430. MD on call made aware. No new orders received, ok to leave IV amio at current rate. Primary RN, Charlett Nose, made aware. Will continue to monitor.

## 2014-11-02 NOTE — Progress Notes (Signed)
Consulting cardiologist: Irish Lack Primary Cardiologist:Berry, Roderic Palau MD  Cardiology Specific Problem List: 1.Atrial fib with RVR 2. NSTEMI   Subjective:    Called by nurses that patient has gone into Aifib with RVR rate of 140 bpm and mildly hypotensive. Sitting up in chair and symptomatic. Sleeping. Noticed sleep apnea. Sat's 90%.    Objective:   Temp:  [97.6 F (36.4 C)-98.7 F (37.1 C)] 98.7 F (37.1 C) (12/20 0727) Pulse Rate:  [81-117] 81 (12/20 0805) Resp:  [15-35] 19 (12/20 0727) BP: (93-184)/(54-150) 119/80 mmHg (12/20 0805) SpO2:  [92 %-99 %] 92 % (12/20 0727) Weight:  [230 lb 12.8 oz (104.69 kg)] 230 lb 12.8 oz (104.69 kg) (12/20 0424) Last BM Date: 10/28/14  Filed Weights   10/31/14 0603 11/01/14 0424 11/02/14 0424  Weight: 228 lb 11.2 oz (103.738 kg) 228 lb 6.3 oz (103.6 kg) 230 lb 12.8 oz (104.69 kg)    Intake/Output Summary (Last 24 hours) at 11/02/14 0945 Last data filed at 11/01/14 2100  Gross per 24 hour  Intake    240 ml  Output    850 ml  Net   -610 ml    Telemetry: Atrial fib with RVR rate of 140 bpm.   Exam:  General: No acute distress.  HEENT: Conjunctiva and lids normal, oropharynx clear.  Lungs: Clear to auscultation, nonlabored.  Cardiac: No elevated JVP or bruits. RRR, no gallop or rub.   Abdomen: Normoactive bowel sounds, nontender, nondistended.  Extremities: No pitting edema, distal pulses full.  Neuropsychiatric: Alert and oriented x3, affect appropriate.   Lab Results:  Basic Metabolic Panel:  Recent Labs Lab 10/29/14 0222 10/30/14 0503 10/30/14 2054  NA 138 136* 134*  K 3.9 4.5 4.9  CL 97 99 95*  CO2 27 22 26   GLUCOSE 247* 250* 323*  BUN 23 30* 32*  CREATININE 0.69 0.93 1.03  CALCIUM 9.7 9.0 8.9  MG  --   --  1.8    Liver Function Tests:  Recent Labs Lab 10/30/14 2054  AST 39*  ALT 34  ALKPHOS 188*  BILITOT <0.2*  PROT 6.6  ALBUMIN 2.9*    CBC:  Recent Labs Lab 10/31/14 0235  11/01/14 0040 11/02/14 0400  WBC 14.1* 14.5* 16.8*  HGB 9.8* 9.3* 8.8*  HCT 30.9* 28.8* 27.7*  MCV 89.0 88.9 93.0  PLT 281 245 216    Cardiac Enzymes:  Recent Labs Lab 10/30/14 2054 10/31/14 0235 10/31/14 0715  TROPONINI 1.10* 1.07* 0.78*    BNP:  Recent Labs  06/16/14 1050 09/07/14 0406 10/29/14 0222  PROBNP 2014.0* 1423.0* 1070.0*    Coagulation:  Recent Labs Lab 10/29/14 0330  INR 1.18    ECG: Pending    Medications:   Scheduled Medications: . amiodarone  400 mg Oral Daily  . carvedilol  6.25 mg Oral BID WC  . clopidogrel  75 mg Oral Daily  . furosemide  40 mg Oral Daily  . gabapentin  300 mg Oral BID  . glipiZIDE  10 mg Oral Q breakfast  . insulin aspart  0-9 Units Subcutaneous TID WC  . insulin aspart  4 Units Subcutaneous TID WC  . insulin NPH Human  40 Units Subcutaneous QHS  . irbesartan  150 mg Oral Daily  . levothyroxine  50 mcg Oral QAC breakfast  . LORazepam  1 mg Oral Once  . multivitamin with minerals  1 tablet Oral Daily  . nicotine  21 mg Transdermal Daily  . rivaroxaban  20 mg Oral Daily  Infusions: . diltiazem (CARDIZEM) infusion 5 mg/hr (10/31/14 1550)    PRN Medications: ALPRAZolam, levalbuterol, oxyCODONE   Assessment and Plan:   1.Atrial fib with RVR: Recurrent since stopping IV amiodarone and changing to po 400 mg daily. She is slightly hypotensive. Has been given her AM medications to include carvedilol 6.25 mg, Lasix 40 mg, ASa, amiodarone 400mg . I will give her fluid bolus of 500 cc, one dose of IV lopressor 5 mg. Increase amiodarone to 400 mg BID.    She has elevated WBC. Will check UA to rule out infective process contributing. Also check CXR with recent hix of CHF.   2. Anemia: Hgb 8.8 this am, down from 9.3. Question dilutional with hx of CHF. CXR pending. She will continue on Plavix and Xarelto. Off heparin. Platelets trending down, 281, 245, 216. Check stools. Consider HIT profile.   3. NSTEMI: Cath without  PCI by Dr. Angelena Form demonstrating small vessel disease. Elevated troponin related to demand ischemia. EF of 30%-35% in rapid atrial fib at time of cath. EF of 40%-45% during repeat echo.    Phill Myron. Lawrence NP [facility]  11/02/2014, 9:45 AM   I have examined the patient and reviewed assessment and plan and discussed with patient.  Agree with above as stated.  When IV obtained, switch back to IV amiodarone.  Worsening anemia.  Stop Plavix.  COntinue Xarelto for stroke repvention.  Hemoccult stools.  Demario Faniel S.

## 2014-11-02 NOTE — Progress Notes (Signed)
Pt noted to be rapid afib, rates in the 140s-150s. Pt's O2 sats in the mid 80s on room air and BP: 80s/50s. Curt Bears with cards paged and made aware. On floor evaluating pt. 500cc bolus and 2.5mg  IV lopressor given as ordered. O2 applied at 2L [place]. 12 lead EKG obtained. Dr. Irish Lack by to evaluate patient during medication adminstration. Will continue to monitor closely. Pt's primary nurse, Charlett Nose updated on current plan of care.

## 2014-11-03 LAB — CBC
HEMATOCRIT: 25.3 % — AB (ref 36.0–46.0)
HEMOGLOBIN: 8.2 g/dL — AB (ref 12.0–15.0)
MCH: 29.4 pg (ref 26.0–34.0)
MCHC: 32.4 g/dL (ref 30.0–36.0)
MCV: 90.7 fL (ref 78.0–100.0)
Platelets: 274 10*3/uL (ref 150–400)
RBC: 2.79 MIL/uL — ABNORMAL LOW (ref 3.87–5.11)
RDW: 14.9 % (ref 11.5–15.5)
WBC: 13.2 10*3/uL — ABNORMAL HIGH (ref 4.0–10.5)

## 2014-11-03 LAB — GLUCOSE, CAPILLARY
GLUCOSE-CAPILLARY: 224 mg/dL — AB (ref 70–99)
GLUCOSE-CAPILLARY: 295 mg/dL — AB (ref 70–99)
GLUCOSE-CAPILLARY: 259 mg/dL — AB (ref 70–99)
Glucose-Capillary: 372 mg/dL — ABNORMAL HIGH (ref 70–99)

## 2014-11-03 MED ORDER — AMIODARONE HCL 200 MG PO TABS
400.0000 mg | ORAL_TABLET | Freq: Two times a day (BID) | ORAL | Status: DC
  Administered 2014-11-03 – 2014-11-06 (×7): 400 mg via ORAL
  Filled 2014-11-03 (×7): qty 2

## 2014-11-03 MED ORDER — AMIODARONE HCL IN DEXTROSE 360-4.14 MG/200ML-% IV SOLN
30.0000 mg/h | INTRAVENOUS | Status: AC
Start: 2014-11-03 — End: 2014-11-03
  Administered 2014-11-03: 30 mg/h via INTRAVENOUS
  Filled 2014-11-03: qty 200

## 2014-11-03 MED ORDER — CETYLPYRIDINIUM CHLORIDE 0.05 % MT LIQD
7.0000 mL | Freq: Two times a day (BID) | OROMUCOSAL | Status: DC
  Administered 2014-11-03 – 2014-11-06 (×7): 7 mL via OROMUCOSAL

## 2014-11-03 NOTE — Progress Notes (Signed)
Patient Name: [name] Date of Encounter: 11/03/2014  Primary Cardiologist:Berry, Roderic Palau MD   Principal Problem:   NSTEMI (non-ST elevated myocardial infarction) Active Problems:   Atrial fibrillation   DM type 2, uncontrolled, with neuropathy    SUBJECTIVE  leg sore from laying in bed, want to sit up. Denies significant SOB or chest discomfort.   CURRENT MEDS . antiseptic oral rinse  7 mL Mouth Rinse BID  . carvedilol  6.25 mg Oral BID WC  . furosemide  40 mg Oral Daily  . gabapentin  300 mg Oral BID  . glipiZIDE  10 mg Oral Q breakfast  . insulin aspart  0-9 Units Subcutaneous TID WC  . insulin aspart  4 Units Subcutaneous TID WC  . insulin NPH Human  40 Units Subcutaneous QHS  . irbesartan  150 mg Oral Daily  . levothyroxine  50 mcg Oral QAC breakfast  . LORazepam  1 mg Oral Once  . multivitamin with minerals  1 tablet Oral Daily  . nicotine  21 mg Transdermal Daily  . rivaroxaban  20 mg Oral Daily    OBJECTIVE  Filed Vitals:   11/02/14 2054 11/03/14 0000 11/03/14 0400 11/03/14 0756  BP: 111/64 104/69 109/63 111/64  Pulse: 75 77 73 77  Temp:  98.1 F (36.7 C) 98.3 F (36.8 C) 98 F (36.7 C)  TempSrc:  Oral Oral Oral  Resp: 27 16 17 16   Height:      Weight:   229 lb 0.9 oz (103.9 kg)   SpO2: 97% 99% 96%     Intake/Output Summary (Last 24 hours) at 11/03/14 0956 Last data filed at 11/03/14 0600  Gross per 24 hour  Intake 1663.31 ml  Output    350 ml  Net 1313.31 ml   Filed Weights   11/01/14 0424 11/02/14 0424 11/03/14 0400  Weight: 228 lb 6.3 oz (103.6 kg) 230 lb 12.8 oz (104.69 kg) 229 lb 0.9 oz (103.9 kg)    PHYSICAL EXAM  General: Pleasant, NAD. Neuro: Alert and oriented X 3. Moves all extremities spontaneously. Psych: Normal affect. HEENT:  Normal  Neck: Supple without bruits or JVD. Lungs:  Resp regular and unlabored, diffusely diminished breath sound, no rale, rhonchi or wheezing.  Heart: RRR no s3, s4, or murmurs. Abdomen:  Soft, non-tender, non-distended, BS + x 4.  Extremities: No clubbing, cyanosis or edema. DP/PT/Radials 2+ and equal bilaterally.  Accessory Clinical Findings  CBC  Recent Labs  11/02/14 0400 11/03/14 0440  WBC 16.8* 13.2*  HGB 8.8* 8.2*  HCT 27.7* 25.3*  MCV 93.0 90.7  PLT 216 274    TELE NSR with HR 70s, a-fib converted to NSR around 2pm yesterday, HR >100 before that    ECG  No new EKG  Echocardiogram 10/30/2014  LV EF: 40% -  45%  ------------------------------------------------------------------- History:  PMH: Polysubstance Abuse, ETOH, Nstemi. NSTEMI. Atrial fibrillation. Congestive heart failure. Chronic obstructive pulmonary disease. PMH:  Myocardial infarction. Risk factors: Current tobacco use. Diabetes mellitus.  ------------------------------------------------------------------- Study Conclusions  - Procedure narrative: Transthoracic echocardiography. Technically difficult study with reduced echocardiographic windows. - Left ventricle: The cavity size was normal. Wall thickness was normal. Systolic function was mildly to moderately reduced. The estimated ejection fraction was in the range of 40% to 45%. There appears to be inferoapical and posterior hypokinesis. The study is not technically sufficient to allow evaluation of LV diastolic function. - Left atrium: The atrium was normal in size.  Impressions:  - Technically difficult study.  Compared to the prior echo in 2014, the EF is reduced to around 45% with what appears to be inferoapical and posterior hypokinesis.    Radiology/Studies  Dg Chest 2 View  10/29/2014   CLINICAL DATA:  Initial evaluation for are chest pain.  EXAM: CHEST  2 VIEW  COMPARISON:  Prior study from 10/13/2014  FINDINGS: Cardiac and mediastinal silhouettes are stable in size and contour, and remain within normal limits.  Lungs are mildly hypoinflated. There is mild diffuse prominence of the  interstitial markings, similar to prior, which may related to history of smoking. No consolidative airspace disease. No pulmonary edema or pleural effusion. No pneumothorax.  No acute osseous abnormality. Accentuation of the normal thoracic kyphosis noted.  IMPRESSION: No active cardiopulmonary disease.   Electronically Signed   By: Jeannine Boga M.D.   On: 10/29/2014 04:14   Dg Chest Port 1 View  11/02/2014   CLINICAL DATA:  CHF, shortness of breath, right-sided chest pain  EXAM: PORTABLE CHEST - 1 VIEW  COMPARISON:  10/29/2014  FINDINGS: Mild bilateral interstitial thickening. There is no focal parenchymal opacity, pleural effusion, or pneumothorax. The heart and mediastinal contours are unremarkable.  The osseous structures are unremarkable.  IMPRESSION: No active disease.   Electronically Signed   By: Kathreen Devoid   On: 11/02/2014 11:04    ASSESSMENT AND PLAN  53 yo female with PMH of PAF, PVD, DM, COPD admitted on 10/29/2014 for CP and NSTEMI. Initially in sinus tach, however went to a-fib with RVR. Also had fever on admission and leukocytosis. 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 Converted on IV amiodarone on 12/18 (see my previous note) after 6.45 sec post termination pause. Xarelto restarted on 11/01/2014. IV amiodarone changed to PO on 12/19. She went back to a-fib with RVR on 12/20, placed back on IV amio, she quickly went back to NSR on the same day.    1. Proxysmal a-fib with RVR, recurrent - now back in NSR  - went back to a-fib with RVR yesterday. Went back on IV amiodarone.  Again has gone back into normal sinus rhythm. Will try again to convert the patient to oral amiodarone.  - continue coreg and lasix. Unfortunately unable to uptitrate coreg due to borderline BP. Plavix discontinued.  2. Demand ischemia: cath 12/18 small vessel CAD  3. Chronic systolic heart failure  - EF 30-35% on  cath, repeat echo EF 40-45%  4. Leukocytosis: blood culture negative x2. Urinalysis shows few bacteria with negative nitrite, however admission UA and culture shows coagulase negative staff, consider prophylactic ABX for UTI.  5. Anemia  Signed, Almyra Deforest PA-C Pager: [phone] Patient seen and examined. I agree with the assessment and plan as detailed above. See also my additional thoughts below.   I have made adjustments to the assessment and plan above. We will try again to convert her to oral amiodarone. Plan each day to review meds to see if any Be titrated.  Dola Argyle, MD, [facility] 11/03/2014 12:36 PM

## 2014-11-03 NOTE — Progress Notes (Signed)
Inpatient Diabetes Program Recommendations  AACE/ADA: New Consensus Statement on Inpatient Glycemic Control (2013)  Target Ranges:  Prepandial:   less than 140 mg/dL      Peak postprandial:   less than 180 mg/dL (1-2 hours)      Critically ill patients:  140 - 180 mg/dL     Results for [name], [name] (MRN [id number]) as of 11/03/2014 07:52  Ref. Range 11/02/2014 07:26 11/02/2014 11:37 11/02/2014 16:41 11/02/2014 21:11  Glucose-Capillary Latest Range: 70-99 mg/dL 196 (H) 306 (H) 315 (H) 383 (H)     Admitted with CP/ NSTEMI. History of DM, COPD, Cocaine abuse.   Home DM Meds: Glipizide 30 mg daily  Metformin 1000 mg bid  Lantus 35 units QHS  70/30 insulin- 4-10 units tid per SSI   Current Insulin Orders: Glipizide 10 mg daily  NPH 40 units QHS  Novolog Sensitive SSI      Novolog 4 units tid with meals     MD- Please consider the following insulin adjustments to help improve glucose control:  1. D/C NPH insulin 2. Start Lantus 40 units QHS 3. Increase Novolog Meal Coverage- Novolog 8 units tid with meals (currently ordered as Novolog 4 units tidwc)    Will follow Wyn Quaker RN, MSN, CDE Diabetes Coordinator Inpatient Diabetes Program Team Pager: [phone] (8a-10p)

## 2014-11-04 DIAGNOSIS — I4891 Unspecified atrial fibrillation: Secondary | ICD-10-CM

## 2014-11-04 LAB — BASIC METABOLIC PANEL
ANION GAP: 10 (ref 5–15)
Anion gap: 7 (ref 5–15)
BUN: 49 mg/dL — AB (ref 6–23)
BUN: 47 mg/dL — AB (ref 6–23)
CO2: 24 mmol/L (ref 19–32)
CO2: 24 mmol/L (ref 19–32)
CREATININE: 1.44 mg/dL — AB (ref 0.50–1.10)
Calcium: 8.3 mg/dL — ABNORMAL LOW (ref 8.4–10.5)
Calcium: 8.5 mg/dL (ref 8.4–10.5)
Chloride: 98 mEq/L (ref 96–112)
Chloride: 102 mEq/L (ref 96–112)
Creatinine, Ser: 1.46 mg/dL — ABNORMAL HIGH (ref 0.50–1.10)
GFR calc Af Amer: 47 mL/min — ABNORMAL LOW (ref >90)
GFR, EST AFRICAN AMERICAN: 47 mL/min — AB (ref >90)
GFR, EST NON AFRICAN AMERICAN: 40 mL/min — AB (ref >90)
GFR, EST NON AFRICAN AMERICAN: 41 mL/min — AB (ref >90)
GLUCOSE: 198 mg/dL — AB (ref 70–99)
Glucose, Bld: 255 mg/dL — ABNORMAL HIGH (ref 70–99)
POTASSIUM: 4.8 mmol/L (ref 3.5–5.1)
Potassium: 4.7 mmol/L (ref 3.5–5.1)
SODIUM: 132 mmol/L — AB (ref 135–145)
Sodium: 133 mmol/L — ABNORMAL LOW (ref 135–145)

## 2014-11-04 LAB — GLUCOSE, CAPILLARY
GLUCOSE-CAPILLARY: 283 mg/dL — AB (ref 70–99)
Glucose-Capillary: 236 mg/dL — ABNORMAL HIGH (ref 70–99)
Glucose-Capillary: 205 mg/dL — ABNORMAL HIGH (ref 70–99)
Glucose-Capillary: 258 mg/dL — ABNORMAL HIGH (ref 70–99)

## 2014-11-04 MED ORDER — SULFAMETHOXAZOLE-TRIMETHOPRIM 800-160 MG PO TABS
1.0000 | ORAL_TABLET | Freq: Two times a day (BID) | ORAL | Status: DC
  Administered 2014-11-04 – 2014-11-05 (×3): 1 via ORAL
  Filled 2014-11-04 (×4): qty 1

## 2014-11-04 MED ORDER — FUROSEMIDE 10 MG/ML IJ SOLN
40.0000 mg | Freq: Two times a day (BID) | INTRAMUSCULAR | Status: DC
  Administered 2014-11-04 – 2014-11-06 (×4): 40 mg via INTRAVENOUS
  Filled 2014-11-04 (×5): qty 4

## 2014-11-04 MED ORDER — FUROSEMIDE 10 MG/ML IJ SOLN
40.0000 mg | Freq: Once | INTRAMUSCULAR | Status: AC
  Administered 2014-11-04: 40 mg via INTRAVENOUS

## 2014-11-04 MED ORDER — ACETAMINOPHEN 325 MG PO TABS
650.0000 mg | ORAL_TABLET | Freq: Four times a day (QID) | ORAL | Status: DC | PRN
  Administered 2014-11-04: 650 mg via ORAL
  Filled 2014-11-04: qty 2

## 2014-11-04 MED ORDER — POLYETHYLENE GLYCOL 3350 17 G PO PACK
17.0000 g | PACK | Freq: Every day | ORAL | Status: DC | PRN
  Administered 2014-11-04: 17 g via ORAL
  Filled 2014-11-04: qty 1

## 2014-11-04 NOTE — Progress Notes (Signed)
Inpatient Diabetes Program Recommendations  AACE/ADA: New Consensus Statement on Inpatient Glycemic Control (2013)  Target Ranges:  Prepandial:   less than 140 mg/dL      Peak postprandial:   less than 180 mg/dL (1-2 hours)      Critically ill patients:  140 - 180 mg/dL    Results for [name], [name] (MRN [id number]) as of 11/04/2014 09:15  Ref. Range 11/03/2014 07:53 11/03/2014 11:33 11/03/2014 17:13 11/03/2014 21:04  Glucose-Capillary Latest Range: 70-99 mg/dL 224 (H) 372 (H) 295 (H) 259 (H)    Results for [name], [name] (MRN [id number]) as of 11/04/2014 09:15  Ref. Range 11/04/2014 07:26  Glucose-Capillary Latest Range: 70-99 mg/dL 236 (H)    Admitted with CP/ NSTEMI. History of DM, COPD, Cocaine abuse.   Home DM Meds: Glipizide 30 mg daily  Metformin 1000 mg bid  Lantus 35 units QHS  70/30 insulin- 4-10 units tid per SSI   Current Insulin Orders: Glipizide 10 mg daily  NPH 40 units QHS  Novolog Sensitive SSI  Novolog 4 units tid with meals     MD- Please consider the following insulin adjustments to help improve glucose control:  1. D/C NPH insulin 2. Start Lantus 45 units QHS 3. Increase Novolog Meal Coverage- Novolog 8 units tid with meals (currently ordered as Novolog 4 units tidwc)    Will follow Wyn Quaker RN, MSN, CDE Diabetes Coordinator Inpatient Diabetes Program Team Pager: [phone] (8a-10p)

## 2014-11-04 NOTE — Clinical Documentation Improvement (Signed)
Presents with NSTEMI, AF with RVR. Patient was cath'd with no PCI intervention.   "Patient with a recent admission with paroxysmal A. fib and RVR and CHF" documented by ED provider note  Being treated with PO Lasix 40mg  Daily  BNP on admission was 1070  ECHO results note: EF 40-45% with inferoapical and posterior hypokinesis; technically difficult study  Please clarify the likely type and acuity of CHF the patient has and document findings in next progress note and include in discharge summary.   Acute, chronic, acute on chronic CHF  Systolic, diastolic, systolic and diastolic CHF  Other condition  Thank You, Zoila Shutter ,RN Clinical Documentation Specialist:  [facility]

## 2014-11-04 NOTE — Progress Notes (Signed)
Patient Name: [name] Date of Encounter: 11/04/2014  Primary Cardiologist:Price, Karla Palau MD   Principal Problem:   Demand ischemia Active Problems:   Dyspnea on exertion   Alcoholic cirrhosis   Cigarette smoker   Hypothyroidism   COPD with acute exacerbation   DM type 2, uncontrolled, with neuropathy   Atrial fibrillation with RVR    SUBJECTIVE  Feels good, denies any SOB or CP. States always had lower extremity pitting edema, unchanged recently.  CURRENT MEDS . amiodarone  400 mg Oral BID  . antiseptic oral rinse  7 mL Mouth Rinse BID  . carvedilol  6.25 mg Oral BID WC  . furosemide  40 mg Oral Daily  . gabapentin  300 mg Oral BID  . glipiZIDE  10 mg Oral Q breakfast  . insulin aspart  0-9 Units Subcutaneous TID WC  . insulin aspart  4 Units Subcutaneous TID WC  . insulin NPH Human  40 Units Subcutaneous QHS  . irbesartan  150 mg Oral Daily  . levothyroxine  50 mcg Oral QAC breakfast  . LORazepam  1 mg Oral Once  . multivitamin with minerals  1 tablet Oral Daily  . nicotine  21 mg Transdermal Daily  . rivaroxaban  20 mg Oral Daily    OBJECTIVE  Filed Vitals:   11/04/14 0419 11/04/14 0500 11/04/14 0530 11/04/14 0805  BP: 97/52  102/61 112/71  Pulse: 77   72  Temp: 98 F (36.7 C)     TempSrc: Oral     Resp: 18  22 15   Height:      Weight:  220 lb 12.8 oz (100.154 kg)    SpO2: 93%   99%    Intake/Output Summary (Last 24 hours) at 11/04/14 0859 Last data filed at 11/04/14 0814  Gross per 24 hour  Intake    720 ml  Output   1200 ml  Net   -480 ml   Filed Weights   11/02/14 0424 11/03/14 0400 11/04/14 0500  Weight: 230 lb 12.8 oz (104.69 kg) 229 lb 0.9 oz (103.9 kg) 220 lb 12.8 oz (100.154 kg)    PHYSICAL EXAM  General: Pleasant, NAD. Neuro: Alert and oriented X 3. Moves all extremities spontaneously. Psych: Normal affect. HEENT:  Normal  Neck: Supple without bruits or JVD. Lungs:  Resp regular and unlabored, CTA. Heart: RRR no s3, s4,  or murmurs. Abdomen: Soft, non-tender, non-distended, BS + x 4.  Extremities: No clubbing, cyanosis. DP/PT/Radials 2+ and equal bilaterally. 1-2+ pitting edema bilaterally  Accessory Clinical Findings  CBC  Recent Labs  11/02/14 0400 11/03/14 0440  WBC 16.8* 13.2*  HGB 8.8* 8.2*  HCT 27.7* 25.3*  MCV 93.0 90.7  PLT 216 [id number]   Basic Metabolic Panel  Recent Labs  11/04/14 0443  NA 132*  K 4.8  CL 98  CO2 24  GLUCOSE 255*  BUN 49*  CREATININE 1.46*  CALCIUM 8.3*    TELE NSR with HR 70s    ECG  No new EKG  Echocardiogram  LV EF: 40% -  45%  ------------------------------------------------------------------- History:  PMH: Polysubstance Abuse, ETOH, Nstemi. NSTEMI. Atrial fibrillation. Congestive heart failure. Chronic obstructive pulmonary disease. PMH:  Myocardial infarction. Risk factors: Current tobacco use. Diabetes mellitus.  ------------------------------------------------------------------- Study Conclusions  - Procedure narrative: Transthoracic echocardiography. Technically difficult study with reduced echocardiographic windows. - Left ventricle: The cavity size was normal. Wall thickness was normal. Systolic function was mildly to moderately reduced. The estimated ejection fraction was in the  range of 40% to 45%. There appears to be inferoapical and posterior hypokinesis. The study is not technically sufficient to allow evaluation of LV diastolic function. - Left atrium: The atrium was normal in size.  Impressions:  - Technically difficult study. Compared to the prior echo in 2014, the EF is reduced to around 45% with what appears to be inferoapical and posterior hypokinesis.    Radiology/Studies  Dg Chest 2 View  10/29/2014   CLINICAL DATA:  Initial evaluation for are chest pain.  EXAM: CHEST  2 VIEW  COMPARISON:  Prior study from 10/13/2014  FINDINGS: Cardiac and mediastinal silhouettes are stable in size and  contour, and remain within normal limits.  Lungs are mildly hypoinflated. There is mild diffuse prominence of the interstitial markings, similar to prior, which may related to history of smoking. No consolidative airspace disease. No pulmonary edema or pleural effusion. No pneumothorax.  No acute osseous abnormality. Accentuation of the normal thoracic kyphosis noted.  IMPRESSION: No active cardiopulmonary disease.   Electronically Signed   By: [name] M.D.   On: 10/29/2014 04:14   Dg Chest Port 1 View  11/02/2014   CLINICAL DATA:  CHF, shortness of breath, right-sided chest pain  EXAM: PORTABLE CHEST - 1 VIEW  COMPARISON:  10/29/2014  FINDINGS: Mild bilateral interstitial thickening. There is no focal parenchymal opacity, pleural effusion, or pneumothorax. The heart and mediastinal contours are unremarkable.  The osseous structures are unremarkable.  IMPRESSION: No active disease.   Electronically Signed   By: [name]   On: 11/02/2014 11:04    ASSESSMENT AND PLAN  53 yo female with PMH of PAF, PVD, DM, COPD admitted on 10/29/2014 for CP and NSTEMI. Initially in sinus tach, however went to a-fib with RVR. Also had fever on admission and leukocytosis. 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 Converted on IV amiodarone on 12/18 (see my previous note) after 6.45 sec post termination pause. Xarelto restarted on 11/01/2014. IV amiodarone changed to PO on 12/19. She went back to a-fib with RVR on 12/20, placed back on IV amio, she quickly went back to NSR on the same day.   1. Proxysmal a-fib with RVR, recurrent - now back in NSR - went back to a-fib with RVR 2 days ago. Went back on IV amiodarone. Again has gone back into normal sinus rhythm. Converted to 400mg  BID PO amio yesterday - continue coreg and lasix. Unfortunately unable to uptitrate coreg due to borderline BP. Plavix  discontinued.  - currently maintaining NSR on coreg, amio.   2. Demand ischemia: cath 12/18 small vessel CAD  3. Chronic systolic heart failure - EF 30-35% on cath, repeat echo EF 40-45%       ([name]....... the patient's edema is significant. Regardless of a prior history of her having edema, I feel that she is wet. I've changed her diuretic to IV twice a day.)  4. Leukocytosis: blood culture negative x2. Urinalysis shows few bacteria with negative nitrite, however admission UA and culture shows coagulase negative staff, consider prophylactic ABX for UTI. Appears to have taken 1 dose of levaquin at the beginning of this admission  5. Anemia  6. AKI: Cr worsened from 0.69 six days ago to 1.46 today. Chronic LE pitting edema unchanged per pt, weight about the same, IO -1108ml Net.   - (Raisha Brabender....... her baseline creatinine is usually in the range of 0.8 - 0.9.  It is  elevated now. I believe she is wet. IV Lasix will be used. Patient is not ready to go home.  Hilbert Corrigan PA-C Pager: [phone] Patient seen and examined. I agree with the assessment and plan as detailed above. See also my additional thoughts below.   My thoughts are reflected in the assessment and plan above. The patient is volume overloaded. We will diuresis her and watch her renal function.  Dola Argyle, MD, [facility] 11/04/2014 11:27 AM

## 2014-11-05 DIAGNOSIS — I248 Other forms of acute ischemic heart disease: Principal | ICD-10-CM

## 2014-11-05 LAB — CULTURE, BLOOD (ROUTINE X 2)
CULTURE: NO GROWTH
CULTURE: NO GROWTH

## 2014-11-05 LAB — GLUCOSE, CAPILLARY
GLUCOSE-CAPILLARY: 139 mg/dL — AB (ref 70–99)
Glucose-Capillary: 222 mg/dL — ABNORMAL HIGH (ref 70–99)
Glucose-Capillary: 230 mg/dL — ABNORMAL HIGH (ref 70–99)
Glucose-Capillary: 178 mg/dL — ABNORMAL HIGH (ref 70–99)

## 2014-11-05 NOTE — Progress Notes (Signed)
Patient Name: [name] Date of Encounter: 11/05/2014  Primary Cardiologist:Berry, Roderic Palau MD   Principal Problem:   Demand ischemia Active Problems:   Dyspnea on exertion   Alcoholic cirrhosis   Cigarette smoker   Hypothyroidism   COPD with acute exacerbation   DM type 2, uncontrolled, with neuropathy   Atrial fibrillation with RVR    SUBJECTIVE  Denies any SOB or CP. Feeling good.   CURRENT MEDS . amiodarone  400 mg Oral BID  . antiseptic oral rinse  7 mL Mouth Rinse BID  . carvedilol  6.25 mg Oral BID WC  . furosemide  40 mg Intravenous BID  . gabapentin  300 mg Oral BID  . glipiZIDE  10 mg Oral Q breakfast  . insulin aspart  0-9 Units Subcutaneous TID WC  . insulin aspart  4 Units Subcutaneous TID WC  . insulin NPH Human  40 Units Subcutaneous QHS  . irbesartan  150 mg Oral Daily  . levothyroxine  50 mcg Oral QAC breakfast  . LORazepam  1 mg Oral Once  . multivitamin with minerals  1 tablet Oral Daily  . nicotine  21 mg Transdermal Daily  . rivaroxaban  20 mg Oral Daily  . sulfamethoxazole-trimethoprim  1 tablet Oral Q12H    OBJECTIVE  Filed Vitals:   11/04/14 1417 11/04/14 1803 11/04/14 1941 11/05/14 0500  BP: 110/55 121/58 124/62 100/58  Pulse: 74  79 76  Temp: 98.7 F (37.1 C)  98.2 F (36.8 C) 98.6 F (37 C)  TempSrc: Oral  Oral Oral  Resp: 16  20 20   Height:      Weight:      SpO2: 99%  100% 100%    Intake/Output Summary (Last 24 hours) at 11/05/14 0949 Last data filed at 11/05/14 [id number]  Gross per 24 hour  Intake    880 ml  Output   2450 ml  Net  -1570 ml   Filed Weights   11/02/14 0424 11/03/14 0400 11/04/14 0500  Weight: 230 lb 12.8 oz (104.69 kg) 229 lb 0.9 oz (103.9 kg) 220 lb 12.8 oz (100.154 kg)    PHYSICAL EXAM  General: Pleasant, NAD. Neuro: Alert and oriented X 3. Moves all extremities spontaneously. Psych: Normal affect. HEENT:  Normal  Neck: Supple without bruits or JVD. Lungs:  Resp regular and unlabored,  CTA. Heart: RRR no s3, s4, or murmurs. Abdomen: Soft, non-tender, non-distended, BS + x 4.  Extremities: No clubbing, cyanosis. DP/PT/Radials 2+ and equal bilaterally. 1-2+ pitting edema in his LE  Accessory Clinical Findings  CBC  Recent Labs  11/03/14 0440  WBC 13.2*  HGB 8.2*  HCT 25.3*  MCV 90.7  PLT 123456   Basic Metabolic Panel  Recent Labs  11/04/14 0443 11/04/14 1210  NA 132* 133*  K 4.8 4.7  CL 98 102  CO2 24 24  GLUCOSE 255* 198*  BUN 49* 47*  CREATININE 1.46* 1.44*  CALCIUM 8.3* 8.5    TELE NSR with HR 70s    ECG  No new EKG  Echocardiogram 10/30/2014  LV EF: 40% -  45%  ------------------------------------------------------------------- History:  PMH: Polysubstance Abuse, ETOH, Nstemi. NSTEMI. Atrial fibrillation. Congestive heart failure. Chronic obstructive pulmonary disease. PMH:  Myocardial infarction. Risk factors: Current tobacco use. Diabetes mellitus.  ------------------------------------------------------------------- Study Conclusions  - Procedure narrative: Transthoracic echocardiography. Technically difficult study with reduced echocardiographic windows. - Left ventricle: The cavity size was normal. Wall thickness was normal. Systolic function was mildly to moderately reduced. The  estimated ejection fraction was in the range of 40% to 45%. There appears to be inferoapical and posterior hypokinesis. The study is not technically sufficient to allow evaluation of LV diastolic function. - Left atrium: The atrium was normal in size.  Impressions:  - Technically difficult study. Compared to the prior echo in 2014, the EF is reduced to around 45% with what appears to be inferoapical and posterior hypokinesis.    Radiology/Studies  Dg Chest 2 View  10/29/2014   CLINICAL DATA:  Initial evaluation for are chest pain.  EXAM: CHEST  2 VIEW  COMPARISON:  Prior study from 10/13/2014  FINDINGS: Cardiac and  mediastinal silhouettes are stable in size and contour, and remain within normal limits.  Lungs are mildly hypoinflated. There is mild diffuse prominence of the interstitial markings, similar to prior, which may related to history of smoking. No consolidative airspace disease. No pulmonary edema or pleural effusion. No pneumothorax.  No acute osseous abnormality. Accentuation of the normal thoracic kyphosis noted.  IMPRESSION: No active cardiopulmonary disease.   Electronically Signed   By: Jeannine Boga M.D.   On: 10/29/2014 04:14   Dg Chest Port 1 View  11/02/2014   CLINICAL DATA:  CHF, shortness of breath, right-sided chest pain  EXAM: PORTABLE CHEST - 1 VIEW  COMPARISON:  10/29/2014  FINDINGS: Mild bilateral interstitial thickening. There is no focal parenchymal opacity, pleural effusion, or pneumothorax. The heart and mediastinal contours are unremarkable.  The osseous structures are unremarkable.  IMPRESSION: No active disease.   Electronically Signed   By: Kathreen Devoid   On: 11/02/2014 11:04    ASSESSMENT AND PLAN  53 yo female with PMH of PAF, PVD, DM, COPD admitted on 10/29/2014 for CP and NSTEMI. Initially in sinus tach, however went to a-fib with RVR. Also had fever on admission and leukocytosis. 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 Converted on IV amiodarone on 12/18 (see my previous note) after 6.45 sec post termination pause. Xarelto restarted on 11/01/2014. IV amiodarone changed to PO on 12/19. She went back to a-fib with RVR on 12/20, placed back on IV amio, she quickly went back to NSR on the same day.   1. Proxysmal a-fib with RVR, recurrent - now back in NSR - recurrent a-fib x 2 during this hospitalization, currently maintain NSR on 400mg  BID PO amiodarone (previously went back to a-fib when transitioned from IV amio to 400mg  daily PO amio) - continue coreg and  lasix. Unfortunately unable to uptitrate coreg due to borderline BP. Plavix discontinued.  2. Demand ischemia: cath 12/18 small vessel CAD  3. Acute on chronic systolic heart failure - EF 30-35% on cath, repeat echo EF 40-45%  - -2L so far, continue ARB give LV dysfunction. Still has significant LE edema, will continue IV lasix for now.  4. Leukocytosis: blood culture negative x2. Urinalysis shows few bacteria with negative nitrite, likely contaminant. Patient is afebrile  5. Anemia  6. AKI:   - Cr stable with IV lasix, initial rise likely related to venous congestion.   Hilbert Corrigan PA-C Pager: [phone] Patient seen and examined. I agree with the assessment and plan as detailed above. See also my additional thoughts below.   Continue diuresis.  Dola Argyle, MD, [facility] 11/05/2014 11:11 AM

## 2014-11-06 ENCOUNTER — Encounter (HOSPITAL_COMMUNITY): Payer: Self-pay | Admitting: Physician Assistant

## 2014-11-06 LAB — BASIC METABOLIC PANEL
Anion gap: 8 (ref 5–15)
BUN: 42 mg/dL — ABNORMAL HIGH (ref 6–23)
CO2: 27 mmol/L (ref 19–32)
Calcium: 8.9 mg/dL (ref 8.4–10.5)
Chloride: 100 mEq/L (ref 96–112)
Creatinine, Ser: 1.56 mg/dL — ABNORMAL HIGH (ref 0.50–1.10)
GFR calc Af Amer: 43 mL/min — ABNORMAL LOW (ref >90)
GFR calc non Af Amer: 37 mL/min — ABNORMAL LOW (ref >90)
GLUCOSE: 102 mg/dL — AB (ref 70–99)
Potassium: 5.3 mmol/L — ABNORMAL HIGH (ref 3.5–5.1)
Sodium: 135 mmol/L (ref 135–145)

## 2014-11-06 LAB — CBC
HCT: 27.6 % — ABNORMAL LOW (ref 36.0–46.0)
HEMOGLOBIN: 8.7 g/dL — AB (ref 12.0–15.0)
MCH: 28.2 pg (ref 26.0–34.0)
MCHC: 31.5 g/dL (ref 30.0–36.0)
MCV: 89.3 fL (ref 78.0–100.0)
Platelets: 351 10*3/uL (ref 150–400)
RBC: 3.09 MIL/uL — ABNORMAL LOW (ref 3.87–5.11)
RDW: 14.8 % (ref 11.5–15.5)
WBC: 10.8 10*3/uL — ABNORMAL HIGH (ref 4.0–10.5)

## 2014-11-06 LAB — GLUCOSE, CAPILLARY
GLUCOSE-CAPILLARY: 98 mg/dL (ref 70–99)
GLUCOSE-CAPILLARY: 270 mg/dL — AB (ref 70–99)

## 2014-11-06 MED ORDER — AMIODARONE HCL 400 MG PO TABS: 400.0000 mg | ORAL_TABLET | Freq: Two times a day (BID) | ORAL | Status: DC

## 2014-11-06 MED ORDER — NICOTINE 21 MG/24HR TD PT24: 21.0000 mg | MEDICATED_PATCH | Freq: Every day | TRANSDERMAL | Status: DC

## 2014-11-06 MED ORDER — FUROSEMIDE 40 MG PO TABS: 40.0000 mg | ORAL_TABLET | Freq: Two times a day (BID) | ORAL | Status: DC

## 2014-11-06 MED ORDER — VALSARTAN 160 MG PO TABS: 160.0000 mg | ORAL_TABLET | Freq: Every day | ORAL | Status: DC

## 2014-11-06 NOTE — Discharge Summary (Signed)
Discharge Summary   Patient ID: [name],  MRN: [id number], DOB/AGE: [birth year] 53 y.o.  Admit date: 10/29/2014 Discharge date: 11/06/2014  Primary Care Provider: Angelica Chessman Primary Cardiologist: Dr. Gwenlyn Found  Discharge Diagnoses Principal Problem:   Demand ischemia Active Problems:   Dyspnea on exertion   Alcoholic cirrhosis   Cigarette smoker   Hypothyroidism   COPD with acute exacerbation   DM type 2, uncontrolled, with neuropathy   Atrial fibrillation with RVR   Allergies No Known Allergies  Procedures  Echocardiogram 10/30/2014 LV EF: 40% -  45%  ------------------------------------------------------------------- History:  PMH: Polysubstance Abuse, ETOH, Nstemi. NSTEMI. Atrial fibrillation. Congestive heart failure. Chronic obstructive pulmonary disease. PMH:  Myocardial infarction. Risk factors: Current tobacco use. Diabetes mellitus.  ------------------------------------------------------------------- Study Conclusions  - Procedure narrative: Transthoracic echocardiography. Technically difficult study with reduced echocardiographic windows. - Left ventricle: The cavity size was normal. Wall thickness was normal. Systolic function was mildly to moderately reduced. The estimated ejection fraction was in the range of 40% to 45%. There appears to be inferoapical and posterior hypokinesis. The study is not technically sufficient to allow evaluation of LV diastolic function. - Left atrium: The atrium was normal in size.  Impressions:  - Technically difficult study. Compared to the prior echo in 2014, the EF is reduced to around 45% with what appears to be inferoapical and posterior hypokinesis.     Cardiac catheterization 10/31/2014 Procedure Performed:  1. Left Heart Catheterization 2. Selective Coronary Angiography 3. Left ventricular angiogram  Hemodynamic Findings: Central aortic pressure: 112/74 Left  ventricular pressure: 104/14/21  Angiographic Findings:  Left main: No obstructive disease.   Left Anterior Descending Artery: Large caliber vessel that courses to the apex. The mid vessel has mild diffuse plaque. The first diagonal branch is a small caliber vessel with ostial 30% stenosis. The second diagonal branch is a small to moderate caliber vessel with distal 50% stenosis.   Circumflex Artery: Large caliber vessel with 30% mid stenosis. The first obtuse marginal branch is a small caliber vessel (1.5 mm) with diffuse 80% stenosis in the proximal and mid segment. The second obtuse marginal branch is small in caliber with 40% mid stenosis. The left posterolateral branch has mild diffuse plaque.   Right Coronary Artery: Small caliber non-dominant vessel with mid 90% stenosis.   Left Ventricular Angiogram: LVEF=30-35% with severe hypokinesis fo the anterior wall and apex, inferoapex.   Impression: 1. Rapid atrial fibrillation during case 2. Moderately reduced LV systolic function 3. Diffuse small vessel disease including the small non-dominant RCA and small caliber first obtuse marginal branch 4. Elevated troponin likely due to demand ischemia with underlying small vessel CAD and rapid atrial fib  Recommendations: Medical management of CAD. As above, her elevated troponin is likely related to demand ischemia with small vessel CAD and rapid atrial fib. Rate is still poorly controlled on IV amiodarone and IV diltiazem. May need TEE guided cardioversion.    Complications: None. The patient tolerated the procedure well.    Hospital Course  The patient is a 53 year old Caucasian female with history of PAF, COPD, chronic tobacco abuse, CHF, hypothyroidism, PAD, HTN and DM. she presented to [facility] on 10/29/2014 for evaluation of chest pain and elevated troponin. She also had severe shoulder pain. Her troponin went up to 3.89 before start to trend down. Her white  blood cell count was 12.3. ProBNP 1070. She was placed on IV heparin and Xarelto was held in anticipation of cardiac catheterization.   It was  originally intended for the patient to undergo cardiac catheterization on 12/17, however she had fever and leukocytosis. Cardiac cath was delayed. Urine and blood culture was obtained. Influenza PCR was negative. She was given one dose of Levaquin which was later discontinued due to lack of further evidence of infection. Echocardiogram was obtained on 12/17 which showed EF 40-45%, inferolateral apical and posterior hypokinesis. Overnight, she went into atrial fibrillation with heart rate in the 180s, IV Lopressor was given with some improvement however her rate was still 150s. She was placed on IV diltiazem and IV amiodarone. Her fever resolved overnight, blood culture was negative. Urine culture does show some coagulase-negative staph, which is likely contamination. Repeat urinalysis shows only few bacteria and negative nitrite. She underwent planned cardiac catheterization on 10/31/2014 which showed EF 30-35%, severe hypokinesis of anterior, apex and inferoapex wall, rapid atrial fibrillation during the case, diffuse small vessel disease including small nondominant RCA and a small OM1. It was felt the elevated troponin was likely due to demand ischemia with underlying small vessel CAD and rapid A. fib. TEE guided cardioversion was recommended. In the afternoon of 10/31/2014, after a 6.45 second posttermination pulse, patient converted to normal sinus rhythm. She was seen on the following day, her heparin was discontinued and her Xarelto was restarted. She was transitioned to 400 mg daily of amiodarone.  unfortunately, shortly after switching to oral amiodarone, she went back into atrial fibrillation with RVR. She was placed back on IV amiodarone. Her atrial fibrillation eventually terminated on same day and she went back to normal rhythm.   She was seen on 12/21, she  was transitioned to 400mg  BID amiodarone, however she has significant lower extremity edema. She was placed on IV Lasix for diuresis. She was seen the morning of 11/06/2014, at which time her lower extremity edema continued to persist, however physical exam shows clear lung and no JVD, her lower extremity edema is likely related to the venous insufficiency. She is deemed stable for discharge from cardiology perspective. She has a previously scheduled follow-up with Dr. Gwenlyn Found.  Of note, during this hospitalization, we have discontinued her aspirin and Plavix and resume her Xarelto. Her Imdur was also discontinued due to hypotension. She will need to be reassessed on follow-up to see if Imdur could be potentially added back. She has been started on amiodarone 400 mg twice a day. We are hesitant to decrease it down to 400 mg daily as she went back to atrial fibrillation the last time after decrease it down to 400 mg daily shortly after IV amiodarone loading. She should be reassessed on follow-up, and consider decreasing her amiodarone dose to 400 mg daily or 200 mg twice a day if she is able to maintain normal sinus rhythm on followup.   Discharge Vitals Blood pressure 121/60, pulse 70, temperature 98.2 F (36.8 C), temperature source Oral, resp. rate 18, height 5\' 4"  (1.626 m), weight 232 lb 12.8 oz (105.597 kg), last menstrual period 11/27/2012, SpO2 100 %.  Filed Weights   11/04/14 0500 11/06/14 0458 11/06/14 0930  Weight: 220 lb 12.8 oz (100.154 kg) 239 lb 9.6 oz (108.682 kg) 232 lb 12.8 oz (105.597 kg)    Labs  CBC  Recent Labs  11/06/14 0427  WBC 10.8*  HGB 8.7*  HCT 27.6*  MCV 89.3  PLT [id number]   Basic Metabolic Panel  Recent Labs  11/04/14 1210 11/06/14 0427  NA 133* 135  K 4.7 5.3*  CL 102 100  CO2 24 27  GLUCOSE 198* 102*  BUN 47* 42*  CREATININE 1.44* 1.56*  CALCIUM 8.5 8.9    Disposition  Pt is being discharged home today in good condition.  Follow-up Plans &  Appointments      Follow-up Information    Follow up with [name], MD On 11/18/2014.   Specialty:  Cardiology   Why:  8:15am   Contact information:   [address] [place] [address] [phone]       Discharge Medications    Medication List    STOP taking these medications        aspirin EC 81 MG tablet     clopidogrel 75 MG tablet  Commonly known as:  PLAVIX     isosorbide mononitrate 30 MG 24 hr tablet  Commonly known as:  IMDUR     levofloxacin 750 MG tablet  Commonly known as:  LEVAQUIN      TAKE these medications        ACCU-CHEK AVIVA PLUS test strip  Generic drug:  glucose blood  1 each by Other route See admin instructions. Check blood sugar twice daily.     ACCU-CHEK SOFTCLIX LANCETS lancets  1 each by Other route See admin instructions. Check blood sugar twice daily.     albuterol 108 (90 BASE) MCG/ACT inhaler  Commonly known as:  PROVENTIL HFA;VENTOLIN HFA  Inhale 1-2 puffs into the lungs every 4 (four) hours as needed for wheezing or shortness of breath.     amiodarone 400 MG tablet  Commonly known as:  PACERONE  Take 1 tablet (400 mg total) by mouth 2 (two) times daily.     carvedilol 6.25 MG tablet  Commonly known as:  COREG  Take 1 tablet (6.25 mg total) by mouth 2 (two) times daily with a meal.     DIOVAN 160 MG tablet  Generic drug:  valsartan  Take 1 tablet (160 mg total) by mouth daily.     valsartan 160 MG tablet  Commonly known as:  DIOVAN  Take 1 tablet (160 mg total) by mouth daily.     furosemide 40 MG tablet  Commonly known as:  LASIX  Take 1 tablet (40 mg total) by mouth 2 (two) times daily.     gabapentin 300 MG capsule  Commonly known as:  NEURONTIN  Take 1 capsule (300 mg total) by mouth 2 (two) times daily.     glipiZIDE 10 MG 24 hr tablet  Commonly known as:  GLUCOTROL XL  Take 30 mg by mouth daily with breakfast.     insulin glargine 100 UNIT/ML injection  Commonly known as:  LANTUS    Inject 35 Units into the skin at bedtime.     insulin NPH-regular Human (70-30) 100 UNIT/ML injection  Commonly known as:  NOVOLIN 70/30  Inject 4-10 Units into the skin 3 (three) times daily. Per sliding scale.     ipratropium-albuterol 0.5-2.5 (3) MG/3ML Soln  Commonly known as:  DUONEB  Take 3 mLs by nebulization every 6 (six) hours as needed (for shortness of breath).     levothyroxine 50 MCG tablet  Commonly known as:  SYNTHROID, LEVOTHROID  Take 1 tablet (50 mcg total) by mouth daily.     metFORMIN 1000 MG tablet  Commonly known as:  GLUCOPHAGE  Take 1,000 mg by mouth 2 (two) times daily with a meal.     multivitamin with minerals tablet  Take 1 tablet by mouth daily.     nicotine 21  mg/24hr patch  Commonly known as:  NICODERM CQ - dosed in mg/24 hours  Place 1 patch (21 mg total) onto the skin daily.     oxyCODONE 5 MG immediate release tablet  Commonly known as:  Oxy IR/ROXICODONE  Take 5 mg by mouth every 8 (eight) hours as needed for moderate pain.     rivaroxaban 20 MG Tabs tablet  Commonly known as:  XARELTO  Take 1 tablet (20 mg total) by mouth every morning.     silver sulfADIAZINE 1 % cream  Commonly known as:  SILVADENE  Apply 1 application topically daily as needed (rash spots on legs).         Duration of Discharge Encounter   Greater than 30 minutes including physician time.  Hilbert Corrigan PA-C Pager: [phone] 11/06/2014, 12:01 PM   Patient seen and examined. I agree with the assessment and plan as detailed above. See also my additional thoughts below.   I made the decision for discharge. Patient is improved.  Dola Argyle, MD, [facility] 11/06/2014 1:44 PM

## 2014-11-06 NOTE — Progress Notes (Signed)
Discharge instructions given. Pt verbalized understanding and all questions were answered.  

## 2014-11-06 NOTE — Progress Notes (Signed)
Patient Name: [name] Date of Encounter: 11/06/2014  Primary Cardiologist:Berry, Roderic Palau MD   Principal Problem:   Demand ischemia Active Problems:   Dyspnea on exertion   Alcoholic cirrhosis   Cigarette smoker   Hypothyroidism   COPD with acute exacerbation   DM type 2, uncontrolled, with neuropathy   Atrial fibrillation with RVR    SUBJECTIVE  Denies any SOB or CP. Continue to feel good. Some mild SOB with walking. LE unchanged.  CURRENT MEDS . amiodarone  400 mg Oral BID  . antiseptic oral rinse  7 mL Mouth Rinse BID  . carvedilol  6.25 mg Oral BID WC  . furosemide  40 mg Intravenous BID  . gabapentin  300 mg Oral BID  . glipiZIDE  10 mg Oral Q breakfast  . insulin aspart  0-9 Units Subcutaneous TID WC  . insulin aspart  4 Units Subcutaneous TID WC  . insulin NPH Human  40 Units Subcutaneous QHS  . irbesartan  150 mg Oral Daily  . levothyroxine  50 mcg Oral QAC breakfast  . LORazepam  1 mg Oral Once  . multivitamin with minerals  1 tablet Oral Daily  . nicotine  21 mg Transdermal Daily  . rivaroxaban  20 mg Oral Daily    OBJECTIVE  Filed Vitals:   11/05/14 0500 11/05/14 1346 11/05/14 2010 11/06/14 0458  BP: 100/58 91/53 107/49 121/60  Pulse: 76 69 72 70  Temp: 98.6 F (37 C) 98.1 F (36.7 C) 98.6 F (37 C) 98.2 F (36.8 C)  TempSrc: Oral Oral Oral Oral  Resp: 20 17 18 18   Height:      Weight:    239 lb 9.6 oz (108.682 kg)  SpO2: 100% 99% 99% 100%    Intake/Output Summary (Last 24 hours) at 11/06/14 0924 Last data filed at 11/06/14 0824  Gross per 24 hour  Intake    740 ml  Output      0 ml  Net    740 ml   Filed Weights   11/03/14 0400 11/04/14 0500 11/06/14 0458  Weight: 229 lb 0.9 oz (103.9 kg) 220 lb 12.8 oz (100.154 kg) 239 lb 9.6 oz (108.682 kg)    PHYSICAL EXAM  General: Pleasant, NAD. Neuro: Alert and oriented X 3. Moves all extremities spontaneously. Psych: Normal affect. HEENT:  Normal  Neck: Supple without bruits or  JVD. Lungs:  Resp regular and unlabored, CTA. Heart: RRR no s3, s4, or murmurs. Abdomen: Soft, non-tender, non-distended, BS + x 4.  Extremities: No clubbing, cyanosis. DP/PT/Radials 2+ and equal bilaterally. 1-2+ pitting edema in his LE  Accessory Clinical Findings  CBC  Recent Labs  11/06/14 0427  WBC 10.8*  HGB 8.7*  HCT 27.6*  MCV 89.3  PLT [id number]   Basic Metabolic Panel  Recent Labs  11/04/14 1210 11/06/14 0427  NA 133* 135  K 4.7 5.3*  CL 102 100  CO2 24 27  GLUCOSE 198* 102*  BUN 47* 42*  CREATININE 1.44* 1.56*  CALCIUM 8.5 8.9    TELE NSR with HR 70s    ECG  No new EKG  Echocardiogram 10/30/2014  LV EF: 40% -  45%  ------------------------------------------------------------------- History:  PMH: Polysubstance Abuse, ETOH, Nstemi. NSTEMI. Atrial fibrillation. Congestive heart failure. Chronic obstructive pulmonary disease. PMH:  Myocardial infarction. Risk factors: Current tobacco use. Diabetes mellitus.  ------------------------------------------------------------------- Study Conclusions  - Procedure narrative: Transthoracic echocardiography. Technically difficult study with reduced echocardiographic windows. - Left ventricle: The cavity size was  normal. Wall thickness was normal. Systolic function was mildly to moderately reduced. The estimated ejection fraction was in the range of 40% to 45%. There appears to be inferoapical and posterior hypokinesis. The study is not technically sufficient to allow evaluation of LV diastolic function. - Left atrium: The atrium was normal in size.  Impressions:  - Technically difficult study. Compared to the prior echo in 2014, the EF is reduced to around 45% with what appears to be inferoapical and posterior hypokinesis.    Radiology/Studies  Dg Chest 2 View  10/29/2014   CLINICAL DATA:  Initial evaluation for are chest pain.  EXAM: CHEST  2 VIEW  COMPARISON:  Prior study  from 10/13/2014  FINDINGS: Cardiac and mediastinal silhouettes are stable in size and contour, and remain within normal limits.  Lungs are mildly hypoinflated. There is mild diffuse prominence of the interstitial markings, similar to prior, which may related to history of smoking. No consolidative airspace disease. No pulmonary edema or pleural effusion. No pneumothorax.  No acute osseous abnormality. Accentuation of the normal thoracic kyphosis noted.  IMPRESSION: No active cardiopulmonary disease.   Electronically Signed   By: Jeannine Boga M.D.   On: 10/29/2014 04:14   Dg Chest Port 1 View  11/02/2014   CLINICAL DATA:  CHF, shortness of breath, right-sided chest pain  EXAM: PORTABLE CHEST - 1 VIEW  COMPARISON:  10/29/2014  FINDINGS: Mild bilateral interstitial thickening. There is no focal parenchymal opacity, pleural effusion, or pneumothorax. The heart and mediastinal contours are unremarkable.  The osseous structures are unremarkable.  IMPRESSION: No active disease.   Electronically Signed   By: Kathreen Devoid   On: 11/02/2014 11:04    ASSESSMENT AND PLAN  53 yo female with PMH of PAF, PVD, DM, COPD admitted on 10/29/2014 for CP and NSTEMI. Initially in sinus tach, however went to a-fib with RVR. Also had fever on admission and leukocytosis. 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 Converted on IV amiodarone on 12/18 (see my previous note) after 6.45 sec post termination pause. Xarelto restarted on 11/01/2014. IV amiodarone changed to PO on 12/19. She went back to a-fib with RVR on 12/20, placed back on IV amio, she quickly went back to NSR on the same day.Her renal function worsened prior to discharge, although her weight is the same, it was felt she likely has some fluid overload. She was placed on IV lasix for diuresis.   1. Proxysmal a-fib with RVR, recurrent - now back in NSR - recurrent  a-fib x 2 during this hospitalization, currently maintain NSR on 400mg  BID PO amiodarone (previously went back to a-fib when transitioned from IV amio to 400mg  daily PO amio) - continue coreg and lasix. Unfortunately unable to uptitrate coreg due to borderline BP. Plavix discontinued.   2. Demand ischemia: cath 12/18 small vessel CAD  3. Acute on chronic systolic heart failure - EF 30-35% on cath, repeat echo EF 40-45%  - weight inaccurate, lung clear on exam, no RV dysfunction seen on recent echo, ?if related to venous insufficiency. Continue to have LE edema. Will discuss with Dr. Ron Parker, Cr 1.44 --> 1.56.  Lasix will be changed to oral today. (KATZ..... I feel the patient is stable for discharge home today.   4. Leukocytosis: blood culture negative x2. Urinalysis shows few bacteria with negative nitrite, likely contaminant. Patient is afebrile   5. Anemia  6. AKI:   - Cr  stable with IV lasix, initial rise likely related to venous congestion.   Signed, Almyra Deforest PA-C Patient seen and examined. I agree with the assessment and plan as detailed above. See also my additional thoughts below.   The patient is improved. She is stable for discharge home today.  Dola Argyle, MD, [facility] 11/06/2014 10:57 AM

## 2014-11-18 ENCOUNTER — Ambulatory Visit (INDEPENDENT_AMBULATORY_CARE_PROVIDER_SITE_OTHER): Payer: Medicaid Other | Admitting: Cardiovascular Disease

## 2014-11-18 ENCOUNTER — Encounter: Payer: Self-pay | Admitting: Cardiovascular Disease

## 2014-11-18 VITALS — BP 132/76 | HR 95 | Ht 64.0 in | Wt 223.0 lb

## 2014-11-18 DIAGNOSIS — R0989 Other specified symptoms and signs involving the circulatory and respiratory systems: Secondary | ICD-10-CM

## 2014-11-18 DIAGNOSIS — I4891 Unspecified atrial fibrillation: Secondary | ICD-10-CM

## 2014-11-18 DIAGNOSIS — I739 Peripheral vascular disease, unspecified: Secondary | ICD-10-CM

## 2014-11-18 DIAGNOSIS — Z9889 Other specified postprocedural states: Secondary | ICD-10-CM

## 2014-11-18 DIAGNOSIS — Z9862 Peripheral vascular angioplasty status: Secondary | ICD-10-CM

## 2014-11-18 NOTE — Progress Notes (Signed)
11/18/2014 [name]   1961-08-26  [id number]  Primary Physician Karla Chessman, MD Primary Cardiologist: Karla Harp MD Renae Gloss   HPI:   [name] is a 54 year old moderately overweight single Caucasian female mother of one child who lives with her sister. She was referred by [name] at [facility] for peripheral vascular evaluation because of critical limb ischemia. I last saw her in the office 10/03/13 She sees Dr. Jenkins Price at [facility] for cardiomyopathy and proximal atrial fibrillation. Her cardiac risk factors include a 20-pack-year history of tobacco abuse, treated diabetes, and hypertension as well as family history of heart disease. She's never had a heart attack or stroke. She does have COPD. She had paroxysmal atrial fibrillation in the past and has undergone cardioversion in [place].. The lower extremity arterial Doppler studies performed in our office suggested critical limb ischemia with an occluded SFA and popliteal arteries bilaterally with tibial vessel disease as well. She complains of lifestyle limiting claudication. I performed lower extremity angiography on 09/10/13 revealing occluded SFAs bilaterally with chronic occluded tibial vessels as well. I performed TurboHawk directional atherectomy of her entire right SFA with an excellent angiographic result removing a copious amount of atherosclerotic plaque. The ulcer on her right heel ultimately healed. She does continue to smoke one pack per day. She was recently admitted to [facility] because of atrial fibrillation with rapid ventricular response. She underwent cardiac catheterization on 10/31/14 revealing an ejection fraction of 30-35% with disease that was ultimately treated medically. She was discharged home on amiodarone and Xarelto.   Current Outpatient Prescriptions  Medication Sig Dispense Refill  . ACCU-CHEK SOFTCLIX LANCETS lancets 1 each by Other route  See admin instructions. Check blood sugar twice daily.    [name] albuterol (PROVENTIL HFA;VENTOLIN HFA) 108 (90 BASE) MCG/ACT inhaler Inhale 1-2 puffs into the lungs every 4 (four) hours as needed for wheezing or shortness of breath. 1 Inhaler 2  . aspirin 81 MG tablet Take 81 mg by mouth daily.    . carvedilol (COREG) 6.25 MG tablet Take 1 tablet (6.25 mg total) by mouth 2 (two) times daily with a meal. 60 tablet 0  . clopidogrel (PLAVIX) 75 MG tablet Take 75 mg by mouth daily.    [name] DIOVAN 160 MG tablet Take 1 tablet (160 mg total) by mouth daily. 30 tablet 5  . furosemide (LASIX) 40 MG tablet Take 1 tablet (40 mg total) by mouth 2 (two) times daily. 60 tablet 2  . gabapentin (NEURONTIN) 300 MG capsule Take 1 capsule (300 mg total) by mouth 2 (two) times daily. 60 capsule 0  . glipiZIDE (GLUCOTROL XL) 10 MG 24 hr tablet Take 30 mg by mouth daily with breakfast.    . glucose blood (ACCU-CHEK AVIVA PLUS) test strip 1 each by Other route See admin instructions. Check blood sugar twice daily.    . insulin glargine (LANTUS) 100 UNIT/ML injection Inject 35 Units into the skin at bedtime.    . insulin NPH-regular Human (NOVOLIN 70/30) (70-30) 100 UNIT/ML injection Inject 4-10 Units into the skin 3 (three) times daily. Per sliding scale.    [name] ipratropium-albuterol (DUONEB) 0.5-2.5 (3) MG/3ML SOLN Take 3 mLs by nebulization every 6 (six) hours as needed (for shortness of breath). 360 mL 0  . levothyroxine (SYNTHROID, LEVOTHROID) 50 MCG tablet Take 1 tablet (50 mcg total) by mouth daily. 120 tablet 0  . metFORMIN (GLUCOPHAGE) 1000 MG tablet Take 1,000 mg  by mouth 2 (two) times daily with a meal.    . Multiple Vitamins-Minerals (MULTIVITAMIN WITH MINERALS) tablet Take 1 tablet by mouth daily.    . rivaroxaban (XARELTO) 20 MG TABS tablet Take 1 tablet (20 mg total) by mouth every morning. 30 tablet 0  . silver sulfADIAZINE (SILVADENE) 1 % cream Apply 1 application topically daily as needed (rash spots on legs).      [name] amiodarone (PACERONE) 400 MG tablet Take 1 tablet (400 mg total) by mouth 2 (two) times daily. (Patient not taking: Reported on 11/18/2014) 60 tablet 2  . nicotine (NICODERM CQ - DOSED IN MG/24 HOURS) 21 mg/24hr patch Place 1 patch (21 mg total) onto the skin daily. (Patient not taking: Reported on 11/18/2014) 28 patch 0   No current facility-administered medications for this visit.    No Known Allergies  History   Social History  . Marital Status: Single    Spouse Name: N/A    Number of Children: N/A  . Years of Education: N/A   Occupational History  . disabled    Social History Main Topics  . Smoking status: Current Every Day Smoker -- 1.00 packs/day for 38 years    Types: Cigarettes  . Smokeless tobacco: Never Used  . Alcohol Use: 0.6 oz/week    1 Cans of beer per week     Comment: 09/10/2013 "might drink 1 beer/wk; never had problem w/it"  . Drug Use: Yes    Special: "Crack" cocaine, Marijuana     Comment: 09/10/2013 "last marijuana ~ 09/08/2013; last crack was ~ 7 months ago"  . Sexual Activity: Not Currently   Other Topics Concern  . Not on file   Social History Narrative     Review of Systems: General: negative for chills, fever, night sweats or weight changes.  Cardiovascular: negative for chest pain, dyspnea on exertion, edema, orthopnea, palpitations, paroxysmal nocturnal dyspnea or shortness of breath Dermatological: negative for rash Respiratory: negative for cough or wheezing Urologic: negative for hematuria Abdominal: negative for nausea, vomiting, diarrhea, bright red blood per rectum, melena, or hematemesis Neurologic: negative for visual changes, syncope, or dizziness All other systems reviewed and are otherwise negative except as noted above.    Blood pressure 132/76, pulse 95, height 5\' 4"  (1.626 m), weight 223 lb (101.152 kg), last menstrual period 11/27/2012.  General appearance: alert and no distress Neck: no adenopathy, no JVD, supple,  symmetrical, trachea midline, thyroid not enlarged, symmetric, no tenderness/mass/nodules and soft left carotid bruit Lungs: clear to auscultation bilaterally Heart: regular rate and rhythm, S1, S2 normal, no murmur, click, rub or gallop Extremities: 1-2+ pitting edema bilaterally  EKG normal sinus rhythm at 95 with nonspecific ST and T-wave changes. I personally reviewed this EKG  ASSESSMENT AND PLAN:   S/P peripheral artery angioplasty - TurboHawk atherectomy; R SFA History of peripheral arterial disease with right lower extremity critical limb ischemia status post turbo hawk directional atherectomy 09/10/13 with ultimate healing of her ulcer. she does have a known 75% calcified left common femoral artery stenosis as well as a total left SFA with one-vessel runoff. She has 0 vessel runoff on the right. Her left lower extremity arterial Doppler studies were performed in November of last year revealing a right ABI of 0.52 and a left of 0.42. [name] She does complain of claudication.      Karla Harp MD FACP,FACC,FAHA, [facility] 11/18/2014 9:23 AM

## 2014-11-18 NOTE — Patient Instructions (Addendum)
Your physician wants you to follow-up in 1 year with Dr. Gwenlyn Found. You will receive a reminder letter in the mail 2 months in advance. If you do not receive a letter, please call our office to schedule the follow-up appointment.  Carotid Doppler- This test is an ultrasound of the carotid arteries in your neck. It looks at blood flow through these arteries that supply the brain with blood. Allow one hour for this exam. There are no restrictions or special instructions.  Lower extremity arterial doppler- During this test, ultrasound is used to evaluate arterial blood flow in the legs. Allow approximately one hour for this exam.   We will be in contact to make a [facility] follow up visit with a cardiologist at our [facility]. Location.

## 2014-11-18 NOTE — Assessment & Plan Note (Addendum)
History of peripheral arterial disease with right lower extremity critical limb ischemia status post turbo hawk directional atherectomy 09/10/13 with ultimate healing of her ulcer. she does have a known 75% calcified left common femoral artery stenosis as well as a total left SFA with one-vessel runoff. She has 0 vessel runoff on the right. Her left lower extremity arterial Doppler studies were performed in November of last year revealing a right ABI of 0.52 and a left of 0.42. [name] She does complain of claudication.

## 2014-11-21 ENCOUNTER — Other Ambulatory Visit: Payer: Self-pay | Admitting: Internal Medicine

## 2014-12-02 ENCOUNTER — Other Ambulatory Visit: Payer: Self-pay | Admitting: *Deleted

## 2014-12-02 ENCOUNTER — Ambulatory Visit (HOSPITAL_COMMUNITY)
Admission: RE | Admit: 2014-12-02 | Discharge: 2014-12-02 | Disposition: A | Discharge status: Home / Self Care | Payer: Self-pay | Source: Ambulatory Visit | Attending: Cardiology | Admitting: Cardiology

## 2014-12-02 ENCOUNTER — Ambulatory Visit (HOSPITAL_COMMUNITY)
Admission: RE | Admit: 2014-12-02 | Discharge: 2014-12-02 | Disposition: A | Discharge status: Home / Self Care | Payer: Medicaid Other | Source: Ambulatory Visit | Attending: Cardiology | Admitting: Cardiology

## 2014-12-02 DIAGNOSIS — I739 Peripheral vascular disease, unspecified: Secondary | ICD-10-CM

## 2014-12-02 DIAGNOSIS — I6529 Occlusion and stenosis of unspecified carotid artery: Secondary | ICD-10-CM

## 2014-12-02 DIAGNOSIS — R0989 Other specified symptoms and signs involving the circulatory and respiratory systems: Secondary | ICD-10-CM | POA: Insufficient documentation

## 2014-12-02 NOTE — Progress Notes (Signed)
Order placed for referral to [facility] per Dr Gwenlyn Found for carotid artery stenosis

## 2014-12-02 NOTE — Progress Notes (Signed)
Arterial Duplex Lower Ext. Completed. Kynslei Art, BS, RDMS, RVT  

## 2014-12-02 NOTE — Progress Notes (Signed)
Carotid Duplex Completed. Preliminary results by tech - Greater than 80% ICA stenosis bilaterally.  Oda Cogan, BS, RDMS, RVT

## 2014-12-08 NOTE — Progress Notes (Signed)
Patient ID: [name], female   DOB: [birth year], 54 y.o.   MRN: [id number] [name] is a 54 y.o.  moderately overweight single Caucasian female mother of one child who lives with her sister.She is followed by Dr Gwenlyn Found for PVD and has cardiomyopathy and paroxysmal l atrial fibrillation.risk factors include a 20-pack-year history of tobacco abuse, treated diabetes, and hypertension as well as family history of heart disease. She's never had a heart attack or stroke. She does have COPD. She had paroxysmal atrial fibrillation in the past and has undergone cardioversion in [place].. She is currently in sinus rhythm on xarelto  The lower extremity  Doppler studies performed in our office suggested critical limb ischemia with an occluded SFA and popliteal arteries bilaterally with tibial vessel disease as well. She complains of lifestyle limiting claudication. JB performed angiography  on 09/10/13 revealing occluded SFAs bilaterally with chronic occluded tibial vessels as well. Had TurboHawk directional atherectomy of her entire right SFA with an excellent angiographic result removing a copious amount of atherosclerotic plaque. The ulcer on her right heel has healed. Her symptoms have improved as well.  Echo 12/15 reviewed EF 40-45% Study Conclusions  - Procedure narrative: Transthoracic echocardiography. Technically difficult study with reduced echocardiographic windows. - Left ventricle: The cavity size was normal. Wall thickness was normal. Systolic function was mildly to moderately reduced. The estimated ejection fraction was in the range of 40% to 45%. There appears to be inferoapical and posterior hypokinesis. The study is not technically sufficient to allow evaluation of LV diastolic function. - Left atrium: The atrium was normal in size.  Impressions:  - Technically difficult study. Compared to the prior echo in 2014, the EF is reduced to around 45% with what appears to  be inferoapical and posterior hypokinesis.    ROS: Denies fever, malais, weight loss, blurry vision, decreased visual acuity, cough, sputum, SOB, hemoptysis, pleuritic pain, palpitaitons, heartburn, abdominal pain, melena, lower extremity edema, claudication, or rash.  All other systems reviewed and negative  General: Affect appropriate Healthy:  appears stated age 54: normal Neck supple with no adenopathy JVP normal no bruits no thyromegaly Lungs clear with no wheezing and good diaphragmatic motion Heart:  S1/S2 no murmur, no rub, gallop or click PMI normal Abdomen: benighn, BS positve, no tenderness, no AAA no bruit.  No HSM or HJR Distal pulses intact with no bruits No edema Neuro non-focal Skin warm and dry No muscular weakness   Current Outpatient Prescriptions  Medication Sig Dispense Refill  . ACCU-CHEK SOFTCLIX LANCETS lancets 1 each by Other route See admin instructions. Check blood sugar twice daily.    [name] albuterol (PROVENTIL HFA;VENTOLIN HFA) 108 (90 BASE) MCG/ACT inhaler Inhale 1-2 puffs into the lungs every 4 (four) hours as needed for wheezing or shortness of breath. 1 Inhaler 2  . aspirin 81 MG tablet Take 81 mg by mouth daily.    . carvedilol (COREG) 6.25 MG tablet Take 1 tablet (6.25 mg total) by mouth 2 (two) times daily with a meal. 60 tablet 0  . clopidogrel (PLAVIX) 75 MG tablet Take 75 mg by mouth daily.    [name] DIOVAN 160 MG tablet Take 1 tablet (160 mg total) by mouth daily. 30 tablet 5  . gabapentin (NEURONTIN) 300 MG capsule Take 1 capsule (300 mg total) by mouth 2 (two) times daily. 60 capsule 0  . glipiZIDE (GLUCOTROL XL) 10 MG 24 hr tablet Take 30 mg by mouth daily with breakfast.    . glucose  blood (ACCU-CHEK AVIVA PLUS) test strip 1 each by Other route See admin instructions. Check blood sugar twice daily.    . insulin glargine (LANTUS) 100 UNIT/ML injection Inject 35 Units into the skin at bedtime.    . insulin NPH-regular Human (NOVOLIN 70/30)  (70-30) 100 UNIT/ML injection Inject 4-10 Units into the skin 3 (three) times daily. Per sliding scale.    [name] ipratropium-albuterol (DUONEB) 0.5-2.5 (3) MG/3ML SOLN Take 3 mLs by nebulization every 6 (six) hours as needed (for shortness of breath). 360 mL 0  . levothyroxine (SYNTHROID, LEVOTHROID) 50 MCG tablet Take 1 tablet (50 mcg total) by mouth daily. 120 tablet 0  . metFORMIN (GLUCOPHAGE) 1000 MG tablet Take 1,000 mg by mouth 2 (two) times daily with a meal.    . Multiple Vitamins-Minerals (MULTIVITAMIN WITH MINERALS) tablet Take 1 tablet by mouth daily.    . nicotine (NICODERM CQ - DOSED IN MG/24 HOURS) 21 mg/24hr patch Place 1 patch (21 mg total) onto the skin daily. 28 patch 0  . rivaroxaban (XARELTO) 20 MG TABS tablet Take 1 tablet (20 mg total) by mouth every morning. 30 tablet 0  . silver sulfADIAZINE (SILVADENE) 1 % cream Apply 1 application topically daily as needed (rash spots on legs).    . furosemide (LASIX) 40 MG tablet Take 1 tablet (40 mg total) by mouth 2 (two) times daily. (Patient not taking: Reported on 12/09/2014) 60 tablet 2   No current facility-administered medications for this visit.    Allergies  Review of patient's allergies indicates no known allergies.  Electrocardiogram:  11/18/13  SR rate 95 nonspecific ST changes   Assessment and Plan

## 2014-12-09 ENCOUNTER — Ambulatory Visit (INDEPENDENT_AMBULATORY_CARE_PROVIDER_SITE_OTHER): Payer: Self-pay | Admitting: Cardiovascular Disease

## 2014-12-09 VITALS — BP 142/82 | HR 68 | Ht 64.0 in | Wt 239.1 lb

## 2014-12-09 DIAGNOSIS — I999 Unspecified disorder of circulatory system: Secondary | ICD-10-CM

## 2014-12-09 MED ORDER — FUROSEMIDE 40 MG PO TABS: 40.0000 mg | ORAL_TABLET | Freq: Two times a day (BID) | ORAL | Status: DC

## 2014-12-09 NOTE — Assessment & Plan Note (Signed)
Maint NSR no palpitations continue xarelto

## 2014-12-09 NOTE — Assessment & Plan Note (Signed)
ABI's and carotids done 1/19  No report in chart yet.  LE;s with small ucler RLE due to noncompliance with lasix.  No resting claudication She woiuld be high risk for surgical intervention and with poor EF not a pletal candidate

## 2014-12-09 NOTE — Patient Instructions (Addendum)
Your physician wants you to follow-up in:   [place]  3 MON THS WITH  WITH DR  Gwenlyn Found FOR  VASCULAR You will receive a reminder letter in the mail two months in advance. If you don't receive a letter, please call our office to schedule the follow-up appointment. Your physician recommends that you continue on your current medications as directed. Please refer to the Current Medication list given to you today. [name]

## 2014-12-09 NOTE — Assessment & Plan Note (Signed)
Discussed low carb diet.  Target hemoglobin A1c is 6.5 or less.  Continue current medications.  

## 2014-12-09 NOTE — Assessment & Plan Note (Signed)
Stable volume up due to non compliance with diuretic Lasix called into [facility] on [address]

## 2014-12-09 NOTE — Assessment & Plan Note (Signed)
Chronic wheezing and dyspnea  Sees [facility] Smoking issues discussed and relationship to worsening PVD and need for amputation discussed No insight into need to quit

## 2014-12-26 ENCOUNTER — Encounter: Payer: Self-pay | Admitting: Surgery

## 2014-12-29 ENCOUNTER — Encounter: Payer: Self-pay | Admitting: Surgery

## 2014-12-29 ENCOUNTER — Telehealth: Payer: Self-pay | Admitting: Cardiovascular Disease

## 2014-12-29 NOTE — Telephone Encounter (Signed)
Spoke to patient about appointment on tomorrow. Pt verbalized understanding and confirmed appointment for tomorrow.

## 2014-12-29 NOTE — Telephone Encounter (Signed)
Calling to get her test results .[name] Please call

## 2014-12-29 NOTE — Telephone Encounter (Signed)
Discussed Carotid Doppler results with patient. Then was disconnected and unable to discuss Lower Ext Doppler results. An appointment has been scheduled for pt at 10:15AM with Dr. Gwenlyn Found on 2/16. Please advise patient and make sure she can come in.

## 2014-12-30 ENCOUNTER — Ambulatory Visit (INDEPENDENT_AMBULATORY_CARE_PROVIDER_SITE_OTHER): Payer: Self-pay | Admitting: Cardiovascular Disease

## 2014-12-30 ENCOUNTER — Ambulatory Visit: Payer: Self-pay | Admitting: Cardiovascular Disease

## 2014-12-30 ENCOUNTER — Encounter: Payer: Self-pay | Admitting: Cardiovascular Disease

## 2014-12-30 VITALS — BP 160/86 | HR 82 | Ht 64.0 in | Wt 223.6 lb

## 2014-12-30 DIAGNOSIS — I779 Disorder of arteries and arterioles, unspecified: Secondary | ICD-10-CM

## 2014-12-30 DIAGNOSIS — I739 Peripheral vascular disease, unspecified: Secondary | ICD-10-CM

## 2014-12-30 DIAGNOSIS — R5383 Other fatigue: Secondary | ICD-10-CM

## 2014-12-30 DIAGNOSIS — R9439 Abnormal result of other cardiovascular function study: Secondary | ICD-10-CM

## 2014-12-30 DIAGNOSIS — Z01818 Encounter for other preprocedural examination: Secondary | ICD-10-CM

## 2014-12-30 DIAGNOSIS — D689 Coagulation defect, unspecified: Secondary | ICD-10-CM

## 2014-12-30 NOTE — Progress Notes (Signed)
[place] returns today for follow-up of her outpatient noninvasive studies. Her lotion with Dopplers reveal restenosis within her mid right SFA. She also has recurrent symptoms on the side. She'll need re-angiography and intervention. Addition, because of auscultated bruits, carotid Dopplers performed on 12/02/14 revealed high-grade left greater than right internal carotid artery stenosis. We will arrange for her chronic dose carotid angiography at the time of her lower extremity angiography to define the severity of disease and help determine if revascularization strategy. She has stopped her Plavix for unclear reasons and does continue to smoke one pack per day despite counseling to the contrary.   [name], M.D., [place], [facility], [place] [address] [place], [place]  [id number]  [phone] 12/30/2014 2:08 PM

## 2014-12-30 NOTE — Assessment & Plan Note (Signed)
Recent carotid Dopplers performed 12/02/14 revealed high-grade bilateral left greater than right internal carotid artery stenosis. She is neurologically astigmatic. I'm going to arrange for her to undergo angiography at the time of her peripheral angiography as well.

## 2014-12-30 NOTE — Patient Instructions (Signed)
Dr. Gwenlyn Found has ordered a Peripheral Angiogram (Left groin) to be done at [facility].  This procedure is going to look at the bloodflow in your lower extremities.  If Dr. Gwenlyn Found is able to open up the arteries, you will have to spend one night in the hospital.  If he is not able to open the arteries, you will be able to go home that same day.    After the procedure, you will not be allowed to drive for 3 days or push, pull, or lift anything greater than 10 lbs for one week.    You will be required to have the following tests prior to the procedure:  1. Blood work-the blood work can be done no more than 7 days prior to the procedure.  It can be done at any [facility] lab.  There is one downstairs on the first floor of this building and one in the [place] ([address])  *[employer]

## 2014-12-30 NOTE — Assessment & Plan Note (Signed)
History of peripheral arterial disease status post TurboHawk atherectomy right SFA in the setting of critical limb ischemia with a known occluded left SFA. 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 We will arrange for her to undergo angiography and re intervention.

## 2015-01-09 LAB — BASIC METABOLIC PANEL
BUN: 24 mg/dL — AB (ref 6–23)
CO2: 25 meq/L (ref 19–32)
CREATININE: 0.85 mg/dL (ref 0.50–1.10)
Calcium: 10.1 mg/dL (ref 8.4–10.5)
Chloride: 102 mEq/L (ref 96–112)
Glucose, Bld: 248 mg/dL — ABNORMAL HIGH (ref 70–99)
Potassium: 4.3 mEq/L (ref 3.5–5.3)
Sodium: 139 mEq/L (ref 135–145)

## 2015-01-09 LAB — CBC
HCT: 34 % — ABNORMAL LOW (ref 36.0–46.0)
Hemoglobin: 10.9 g/dL — ABNORMAL LOW (ref 12.0–15.0)
MCH: 29 pg (ref 26.0–34.0)
MCHC: 32.1 g/dL (ref 30.0–36.0)
MCV: 90.4 fL (ref 78.0–100.0)
MPV: 11.1 fL (ref 8.6–12.4)
PLATELETS: 373 10*3/uL (ref 150–400)
RBC: 3.76 MIL/uL — ABNORMAL LOW (ref 3.87–5.11)
RDW: 14.3 % (ref 11.5–15.5)
WBC: 10.6 10*3/uL — AB (ref 4.0–10.5)

## 2015-01-09 LAB — PROTIME-INR
INR: 0.98 (ref <1.50)
PROTHROMBIN TIME: 13 s (ref 11.6–15.2)

## 2015-01-09 LAB — APTT: aPTT: 28 seconds (ref 24–37)

## 2015-01-09 LAB — TSH: TSH: 2.756 u[IU]/mL (ref 0.350–4.500)

## 2015-01-15 ENCOUNTER — Ambulatory Visit (HOSPITAL_COMMUNITY)
Admission: RE | Admit: 2015-01-15 | Discharge: 2015-01-16 | Disposition: A | Discharge status: Home / Self Care | Payer: Medicaid Other | Source: Ambulatory Visit | Attending: Cardiovascular Disease | Admitting: Cardiovascular Disease

## 2015-01-15 ENCOUNTER — Encounter (HOSPITAL_COMMUNITY)
Admission: RE | Disposition: A | Discharge status: Home / Self Care | Payer: Self-pay | Source: Ambulatory Visit | Attending: Cardiovascular Disease

## 2015-01-15 ENCOUNTER — Encounter (HOSPITAL_COMMUNITY): Payer: Self-pay | Admitting: General Practice

## 2015-01-15 DIAGNOSIS — I70213 Atherosclerosis of native arteries of extremities with intermittent claudication, bilateral legs: Secondary | ICD-10-CM | POA: Diagnosis not present

## 2015-01-15 DIAGNOSIS — D689 Coagulation defect, unspecified: Secondary | ICD-10-CM

## 2015-01-15 DIAGNOSIS — I480 Paroxysmal atrial fibrillation: Secondary | ICD-10-CM

## 2015-01-15 DIAGNOSIS — R5383 Other fatigue: Secondary | ICD-10-CM

## 2015-01-15 DIAGNOSIS — Z6839 Body mass index (BMI) 39.0-39.9, adult: Secondary | ICD-10-CM | POA: Insufficient documentation

## 2015-01-15 DIAGNOSIS — I6529 Occlusion and stenosis of unspecified carotid artery: Secondary | ICD-10-CM

## 2015-01-15 DIAGNOSIS — I429 Cardiomyopathy, unspecified: Secondary | ICD-10-CM | POA: Insufficient documentation

## 2015-01-15 DIAGNOSIS — F17210 Nicotine dependence, cigarettes, uncomplicated: Secondary | ICD-10-CM | POA: Insufficient documentation

## 2015-01-15 DIAGNOSIS — E1140 Type 2 diabetes mellitus with diabetic neuropathy, unspecified: Secondary | ICD-10-CM | POA: Insufficient documentation

## 2015-01-15 DIAGNOSIS — Q254 Other congenital malformations of aorta: Secondary | ICD-10-CM | POA: Insufficient documentation

## 2015-01-15 DIAGNOSIS — Z79899 Other long term (current) drug therapy: Secondary | ICD-10-CM | POA: Diagnosis not present

## 2015-01-15 DIAGNOSIS — I5032 Chronic diastolic (congestive) heart failure: Secondary | ICD-10-CM | POA: Diagnosis not present

## 2015-01-15 DIAGNOSIS — I10 Essential (primary) hypertension: Secondary | ICD-10-CM | POA: Insufficient documentation

## 2015-01-15 DIAGNOSIS — Z794 Long term (current) use of insulin: Secondary | ICD-10-CM | POA: Insufficient documentation

## 2015-01-15 DIAGNOSIS — E785 Hyperlipidemia, unspecified: Secondary | ICD-10-CM | POA: Insufficient documentation

## 2015-01-15 DIAGNOSIS — I712 Thoracic aortic aneurysm, without rupture: Secondary | ICD-10-CM | POA: Diagnosis not present

## 2015-01-15 DIAGNOSIS — E663 Overweight: Secondary | ICD-10-CM | POA: Insufficient documentation

## 2015-01-15 DIAGNOSIS — J449 Chronic obstructive pulmonary disease, unspecified: Secondary | ICD-10-CM | POA: Diagnosis not present

## 2015-01-15 DIAGNOSIS — I739 Peripheral vascular disease, unspecified: Secondary | ICD-10-CM | POA: Diagnosis present

## 2015-01-15 DIAGNOSIS — I6523 Occlusion and stenosis of bilateral carotid arteries: Secondary | ICD-10-CM | POA: Diagnosis not present

## 2015-01-15 DIAGNOSIS — Z7982 Long term (current) use of aspirin: Secondary | ICD-10-CM | POA: Diagnosis not present

## 2015-01-15 DIAGNOSIS — I7092 Chronic total occlusion of artery of the extremities: Secondary | ICD-10-CM | POA: Insufficient documentation

## 2015-01-15 DIAGNOSIS — Z7901 Long term (current) use of anticoagulants: Secondary | ICD-10-CM | POA: Insufficient documentation

## 2015-01-15 DIAGNOSIS — Z01818 Encounter for other preprocedural examination: Secondary | ICD-10-CM

## 2015-01-15 DIAGNOSIS — R9439 Abnormal result of other cardiovascular function study: Secondary | ICD-10-CM

## 2015-01-15 DIAGNOSIS — I779 Disorder of arteries and arterioles, unspecified: Secondary | ICD-10-CM

## 2015-01-15 HISTORY — PX: LOWER EXTREMITY ANGIOGRAM: SHX5508

## 2015-01-15 HISTORY — PX: CAROTID ANGIOGRAM: SHX5504

## 2015-01-15 LAB — GLUCOSE, CAPILLARY
GLUCOSE-CAPILLARY: 291 mg/dL — AB (ref 70–99)
Glucose-Capillary: 340 mg/dL — ABNORMAL HIGH (ref 70–99)
Glucose-Capillary: 276 mg/dL — ABNORMAL HIGH (ref 70–99)
Glucose-Capillary: 268 mg/dL — ABNORMAL HIGH (ref 70–99)

## 2015-01-15 LAB — POCT ACTIVATED CLOTTING TIME
ACTIVATED CLOTTING TIME: 165 s
ACTIVATED CLOTTING TIME: 227 s
ACTIVATED CLOTTING TIME: 208 s
Activated Clotting Time: 214 seconds
Activated Clotting Time: 178 seconds

## 2015-01-15 SURGERY — ANGIOGRAM, LOWER EXTREMITY

## 2015-01-15 MED ORDER — MORPHINE SULFATE 2 MG/ML IJ SOLN
2.0000 mg | INTRAMUSCULAR | Status: DC | PRN
Start: 2015-01-15 — End: 2015-01-16
  Administered 2015-01-15 (×3): 2 mg via INTRAVENOUS
  Filled 2015-01-15 (×3): qty 1

## 2015-01-15 MED ORDER — INSULIN GLARGINE 100 UNIT/ML ~~LOC~~ SOLN
35.0000 [IU] | Freq: Every day | SUBCUTANEOUS | Status: DC
  Administered 2015-01-15: 23:00:00 35 [IU] via SUBCUTANEOUS
  Filled 2015-01-15 (×2): qty 0.35

## 2015-01-15 MED ORDER — MIDAZOLAM HCL 2 MG/2ML IJ SOLN
INTRAMUSCULAR | Status: AC
  Filled 2015-01-15: qty 2

## 2015-01-15 MED ORDER — SODIUM CHLORIDE 0.9 % IJ SOLN: 3.0000 mL | INTRAMUSCULAR | Status: DC | PRN

## 2015-01-15 MED ORDER — CLOPIDOGREL BISULFATE 75 MG PO TABS
75.0000 mg | ORAL_TABLET | Freq: Every day | ORAL | Status: DC
  Administered 2015-01-16: 09:00:00 75 mg via ORAL
  Filled 2015-01-15: qty 1

## 2015-01-15 MED ORDER — HEPARIN (PORCINE) IN NACL 2-0.9 UNIT/ML-% IJ SOLN
INTRAMUSCULAR | Status: AC
  Filled 2015-01-15: qty 500

## 2015-01-15 MED ORDER — INSULIN ASPART 100 UNIT/ML ~~LOC~~ SOLN
0.0000 [IU] | Freq: Three times a day (TID) | SUBCUTANEOUS | Status: DC
  Administered 2015-01-15: 18:00:00 8 [IU] via SUBCUTANEOUS
  Administered 2015-01-16: 07:00:00 5 [IU] via SUBCUTANEOUS

## 2015-01-15 MED ORDER — FENTANYL CITRATE 0.05 MG/ML IJ SOLN
INTRAMUSCULAR | Status: AC
  Filled 2015-01-15: qty 2

## 2015-01-15 MED ORDER — ALPRAZOLAM 0.25 MG PO TABS
0.2500 mg | ORAL_TABLET | Freq: Two times a day (BID) | ORAL | Status: DC | PRN
  Administered 2015-01-15 (×2): 0.25 mg via ORAL
  Filled 2015-01-15 (×2): qty 1

## 2015-01-15 MED ORDER — ASPIRIN 81 MG PO CHEW: 81.0000 mg | CHEWABLE_TABLET | ORAL | Status: DC

## 2015-01-15 MED ORDER — ACETAMINOPHEN 325 MG PO TABS: 650.0000 mg | ORAL_TABLET | ORAL | Status: DC | PRN

## 2015-01-15 MED ORDER — CARVEDILOL 3.125 MG PO TABS
6.2500 mg | ORAL_TABLET | Freq: Two times a day (BID) | ORAL | Status: DC
  Administered 2015-01-15: 17:00:00 6.25 mg via ORAL
  Filled 2015-01-15: qty 2

## 2015-01-15 MED ORDER — ONDANSETRON HCL 4 MG/2ML IJ SOLN: 4.0000 mg | Freq: Four times a day (QID) | INTRAMUSCULAR | Status: DC | PRN

## 2015-01-15 MED ORDER — SODIUM CHLORIDE 0.9 % IV SOLN
INTRAVENOUS | Status: AC
  Administered 2015-01-15: 75 mL/h via INTRAVENOUS

## 2015-01-15 MED ORDER — FENTANYL CITRATE 0.05 MG/ML IJ SOLN
INTRAMUSCULAR | Status: AC
  Administered 2015-01-15: 25 ug via INTRAVENOUS
  Filled 2015-01-15: qty 2

## 2015-01-15 MED ORDER — IPRATROPIUM-ALBUTEROL 0.5-2.5 (3) MG/3ML IN SOLN
3.0000 mL | Freq: Four times a day (QID) | RESPIRATORY_TRACT | Status: DC | PRN
  Administered 2015-01-15 – 2015-01-16 (×2): 3 mL via RESPIRATORY_TRACT
  Filled 2015-01-15 (×2): qty 3

## 2015-01-15 MED ORDER — ASPIRIN 81 MG PO CHEW: 81.0000 mg | CHEWABLE_TABLET | Freq: Every day | ORAL | Status: DC

## 2015-01-15 MED ORDER — ASPIRIN EC 325 MG PO TBEC
325.0000 mg | DELAYED_RELEASE_TABLET | Freq: Every day | ORAL | Status: DC
  Administered 2015-01-15 – 2015-01-16 (×2): 325 mg via ORAL
  Filled 2015-01-15 (×2): qty 1

## 2015-01-15 MED ORDER — NICOTINE 14 MG/24HR TD PT24
14.0000 mg | MEDICATED_PATCH | Freq: Every day | TRANSDERMAL | Status: DC
  Administered 2015-01-15 – 2015-01-16 (×2): 14 mg via TRANSDERMAL
  Filled 2015-01-15 (×2): qty 1

## 2015-01-15 MED ORDER — CLOPIDOGREL BISULFATE 300 MG PO TABS
ORAL_TABLET | ORAL | Status: AC
  Filled 2015-01-15: qty 1

## 2015-01-15 MED ORDER — HYDRALAZINE HCL 20 MG/ML IJ SOLN
10.0000 mg | INTRAMUSCULAR | Status: DC | PRN
  Administered 2015-01-15: 10 mg via INTRAVENOUS
  Filled 2015-01-15: qty 1

## 2015-01-15 MED ORDER — LIDOCAINE HCL (PF) 1 % IJ SOLN
INTRAMUSCULAR | Status: AC
  Filled 2015-01-15: qty 30

## 2015-01-15 MED ORDER — SODIUM CHLORIDE 0.9 % IV SOLN
INTRAVENOUS | Status: DC
  Administered 2015-01-15: 11:00:00 via INTRAVENOUS

## 2015-01-15 MED ORDER — HEPARIN SODIUM (PORCINE) 1000 UNIT/ML IJ SOLN
INTRAMUSCULAR | Status: AC
  Filled 2015-01-15: qty 1

## 2015-01-15 MED ORDER — FUROSEMIDE 40 MG PO TABS
40.0000 mg | ORAL_TABLET | Freq: Two times a day (BID) | ORAL | Status: DC
  Administered 2015-01-15 – 2015-01-16 (×2): 40 mg via ORAL
  Filled 2015-01-15 (×6): qty 1

## 2015-01-15 MED ORDER — FENTANYL CITRATE 0.05 MG/ML IJ SOLN
25.0000 ug | INTRAMUSCULAR | Status: DC | PRN
  Administered 2015-01-15 (×2): 25 ug via INTRAVENOUS
  Filled 2015-01-15: qty 2

## 2015-01-15 MED ORDER — ZOLPIDEM TARTRATE 5 MG PO TABS
5.0000 mg | ORAL_TABLET | Freq: Every evening | ORAL | Status: DC | PRN
  Filled 2015-01-15: qty 1

## 2015-01-15 NOTE — Progress Notes (Signed)
UR Completed [place] Arabie Graves-Bigelow, RN,BSN [phone]  

## 2015-01-15 NOTE — Interval H&P Note (Signed)
History and Physical Interval Note:  01/15/2015 11:52 AM  [name]  has presented today for surgery, with the diagnosis of pad/carotid artery disease  The various methods of treatment have been discussed with the patient and family. After consideration of risks, benefits and other options for treatment, the patient has consented to  Procedure(s): LOWER EXTREMITY ANGIOGRAM (N/A) CAROTID ANGIOGRAM (N/A) as a surgical intervention .  The patient's history has been reviewed, patient examined, no change in status, stable for surgery.  I have reviewed the patient's chart and labs.  Questions were answered to the patient's satisfaction.     [name]

## 2015-01-15 NOTE — Progress Notes (Signed)
Site area: left groin  Site Prior to Removal:  Level 0  Pressure Applied For 20 MINUTES    Minutes Beginning at 1740  Manual:   Yes.    Patient Status During Pull:  AAO X 4  Post Pull Groin Site:  Level 0  Post Pull Instructions Given:  Yes.    Post Pull Pulses Present:  Yes.    Dressing Applied:  Yes.    Comments:  Tolerated procedure well

## 2015-01-15 NOTE — CV Procedure (Signed)
[name] is a 54 y.o. female    FS:[id number] LOCATION:  FACILITY: [facility]  PHYSICIAN: Quay Burow, M.D. 10-Nov-1961   DATE OF PROCEDURE:  01/15/2015  DATE OF DISCHARGE:     PV Angiogram/Intervention    History obtained from chart review.[name] is a 54 year old moderately overweight single Caucasian female mother of one child who lives with her sister. She was referred by Dr. Franki Monte at [facility] for peripheral vascular evaluation because of critical limb ischemia. I last saw her in the office 10/03/13 She sees Dr. Jenkins Rouge at [facility] for cardiomyopathy and proximal atrial fibrillation. Her cardiac risk factors include a 20-pack-year history of tobacco abuse, treated diabetes, and hypertension as well as family history of heart disease. She's never had a heart attack or stroke. She does have COPD. She had paroxysmal atrial fibrillation in the past and has undergone cardioversion in [place].. The lower extremity arterial Doppler studies performed in our office suggested critical limb ischemia with an occluded SFA and popliteal arteries bilaterally with tibial vessel disease as well. She complains of lifestyle limiting claudication. I performed lower extremity angiography on 09/10/13 revealing occluded SFAs bilaterally with chronic occluded tibial vessels as well. I performed TurboHawk directional atherectomy of her entire right SFA with an excellent angiographic result removing a copious amount of atherosclerotic plaque. The ulcer on her right heel ultimately healed. She does continue to smoke one pack per day. She was recently admitted to [facility] because of atrial fibrillation with rapid ventricular response. She underwent cardiac catheterization on 10/31/14 revealing an ejection fraction of 30-35% with disease that was ultimately treated medically. Her carotid Doppler showed high-grade bilateral ICA disease and lower extremity Dopplers revealed  restenosis within the right SFA with a ABI of 0.34. She is symptomatic on that side. We'll plan to perform carotid angiography, lower extremity angiography and endovascular therapy for lifestyle limiting claudication.  PROCEDURE DESCRIPTION:   The patient was brought to the second floor [facility] lab in the postabsorptive state. She was premedicated with Valium 5 mg by mouth, IV Versed and fentanyl. Her left groin was prepped and shaved in usual sterile fashion. Xylocaine 1% was used for local anesthesia. A 7 French sheath was inserted into the left common femoral artery using standard Seldinger technique. A 5 French pigtail catheter was placed in the aortic arch. Aortic arch angiography was performed in the LAO view. This was then pulled down to the level of the iliac bifurcation, distal abdominal aortography, bilateral iliac angiography with bifemoral runoff was performed using bolus chase digital subtraction step table technique. Following this selective right innominate, right carotid and left carotid angiography were performed using a JB 1 catheter. Visipaque dye was used for the entirety of the case. Retrograde aortic pressure was monitored during the case.   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 There was one-vessel runoff via a diffusely diseased peroneal artery. This represents a progressive "restenosis".   IMPRESSION:[name] has an occluded right  internal carotid artery and  high-grade left internal carotid artery stenosis. 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 We will proceed with [facility] 1 atherectomy followed by drug-coated balloon angioplasty.  Procedure Description:the patient received a total of 15,000 units of heparin with an ending ACT of 208. Contralateral access was obtained with a crossover catheter, Glidewire, Rosen wire and a 7 [place] multipurpose destination sheath. I was able to cross the long segment of subtotal occlusion with a CXI End hole catheter and a 014 [place]  Wire. Following this I placed a 6 mm spider in the below the knee popliteal artery. I then used a [facility] 1 atherectomy device and performed multiple circumferential cuts from the proximal right SFA down to Hunter's canal. I removed copious amounts of atherosclerotic plaque. I then performed drug-coated balloon angioplasty with a 5 x 150 mm long Lutonix balloon along with a 5 x 100 mm millimeter long Lutonix balloon for 2-1/2 minutes. The final lead to graphic results reduction of a long subtotal occlusion to 0% residual with excellent flow. There was severe infrapopliteal disease. The patient received 300 mg of by mouth Plavix at the end of the case. The sheath was withdrawn across the bifurcation over at 035 wire and exchanged for a short 7 [place] sheath. The patient left the lab in stable condition.  Final Impression: [name] has high-grade carotid disease and a long segment of subtotal occlusion right SFA which had successful directional atherectomy followed by Dr. Balloon angioplasty. She'll be treated with double and triple therapy, hydrated and discharged home in the morning. We will get follow-up lower extremity arterial Doppler studies in our [facility] line office in one week and she will see mid-level provider back in 2-3 weeks on a today that I am in the office.    [name] MD,  [facility] 01/15/2015 1:52 PM

## 2015-01-15 NOTE — H&P (Signed)
MU:[id number]  Primary Physician Angelica Chessman, MD Primary Cardiologist: [name] MD Renae Gloss   HPI: Ms. Slutz is a 54 year old moderately overweight single Caucasian female mother of one child who lives with her sister. She was referred by Dr. Franki Monte at [facility] for peripheral vascular evaluation because of critical limb ischemia. I last saw her in the office 10/03/13 She sees Dr. Jenkins Rouge at [facility] for cardiomyopathy and proximal atrial fibrillation. Her cardiac risk factors include a 20-pack-year history of tobacco abuse, treated diabetes, and hypertension as well as family history of heart disease. She's never had a heart attack or stroke. She does have COPD. She had paroxysmal atrial fibrillation in the past and has undergone cardioversion in [place].. The lower extremity arterial Doppler studies performed in our office suggested critical limb ischemia with an occluded SFA and popliteal arteries bilaterally with tibial vessel disease as well. She complains of lifestyle limiting claudication. I performed lower extremity angiography on 09/10/13 revealing occluded SFAs bilaterally with chronic occluded tibial vessels as well. I performed TurboHawk directional atherectomy of her entire right SFA with an excellent angiographic result removing a copious amount of atherosclerotic plaque. The ulcer on her right heel ultimately healed. She does continue to smoke one pack per day. She was recently admitted to [facility] because of atrial fibrillation with rapid ventricular response. She underwent cardiac catheterization on 10/31/14 revealing an ejection fraction of 30-35% with disease that was ultimately treated medically. She was discharged home on amiodarone and Xarelto.   Current Outpatient Prescriptions  Medication Sig Dispense Refill  . ACCU-CHEK SOFTCLIX LANCETS lancets 1 each by Other route See admin instructions. Check blood  sugar twice daily.    [name] albuterol (PROVENTIL HFA;VENTOLIN HFA) 108 (90 BASE) MCG/ACT inhaler Inhale 1-2 puffs into the lungs every 4 (four) hours as needed for wheezing or shortness of breath. 1 Inhaler 2  . aspirin 81 MG tablet Take 81 mg by mouth daily.    . carvedilol (COREG) 6.25 MG tablet Take 1 tablet (6.25 mg total) by mouth 2 (two) times daily with a meal. 60 tablet 0  . clopidogrel (PLAVIX) 75 MG tablet Take 75 mg by mouth daily.    [name] DIOVAN 160 MG tablet Take 1 tablet (160 mg total) by mouth daily. 30 tablet 5  . furosemide (LASIX) 40 MG tablet Take 1 tablet (40 mg total) by mouth 2 (two) times daily. 60 tablet 2  . gabapentin (NEURONTIN) 300 MG capsule Take 1 capsule (300 mg total) by mouth 2 (two) times daily. 60 capsule 0  . glipiZIDE (GLUCOTROL XL) 10 MG 24 hr tablet Take 30 mg by mouth daily with breakfast.    . glucose blood (ACCU-CHEK AVIVA PLUS) test strip 1 each by Other route See admin instructions. Check blood sugar twice daily.    . insulin glargine (LANTUS) 100 UNIT/ML injection Inject 35 Units into the skin at bedtime.    . insulin NPH-regular Human (NOVOLIN 70/30) (70-30) 100 UNIT/ML injection Inject 4-10 Units into the skin 3 (three) times daily. Per sliding scale.    [name] ipratropium-albuterol (DUONEB) 0.5-2.5 (3) MG/3ML SOLN Take 3 mLs by nebulization every 6 (six) hours as needed (for shortness of breath). 360 mL 0  . levothyroxine (SYNTHROID, LEVOTHROID) 50 MCG tablet Take 1 tablet (50 mcg total) by mouth daily. 120 tablet 0  . metFORMIN (GLUCOPHAGE) 1000 MG tablet Take 1,000 mg by mouth 2 (two) times daily with a meal.    .  Multiple Vitamins-Minerals (MULTIVITAMIN WITH MINERALS) tablet Take 1 tablet by mouth daily.    . rivaroxaban (XARELTO) 20 MG TABS tablet Take 1 tablet (20 mg total) by mouth every morning. 30 tablet 0  . silver sulfADIAZINE (SILVADENE) 1 % cream Apply 1 application  topically daily as needed (rash spots on legs).    [name] amiodarone (PACERONE) 400 MG tablet Take 1 tablet (400 mg total) by mouth 2 (two) times daily. (Patient not taking: Reported on 11/18/2014) 60 tablet 2  . nicotine (NICODERM CQ - DOSED IN MG/24 HOURS) 21 mg/24hr patch Place 1 patch (21 mg total) onto the skin daily. (Patient not taking: Reported on 11/18/2014) 28 patch 0   No current facility-administered medications for this visit.    No Known Allergies  History   Social History  . Marital Status: Single    Spouse Name: N/A    Number of Children: N/A  . Years of Education: N/A   Occupational History  . disabled    Social History Main Topics  . Smoking status: Current Every Day Smoker -- 1.00 packs/day for 38 years    Types: Cigarettes  . Smokeless tobacco: Never Used  . Alcohol Use: 0.6 oz/week    1 Cans of beer per week     Comment: 09/10/2013 "might drink 1 beer/wk; never had problem w/it"  . Drug Use: Yes    Special: "Crack" cocaine, Marijuana     Comment: 09/10/2013 "last marijuana ~ 09/08/2013; last crack was ~ 7 months ago"  . Sexual Activity: Not Currently   Other Topics Concern  . Not on file   Social History Narrative     Review of Systems: General: negative for chills, fever, night sweats or weight changes.  Cardiovascular: negative for chest pain, dyspnea on exertion, edema, orthopnea, palpitations, paroxysmal nocturnal dyspnea or shortness of breath Dermatological: negative for rash Respiratory: negative for cough or wheezing Urologic: negative for hematuria Abdominal: negative for nausea, vomiting, diarrhea, bright red blood per rectum, melena, or hematemesis Neurologic: negative for visual changes, syncope, or dizziness All other systems reviewed and are otherwise negative except as noted above.    Blood pressure 132/76, pulse 95, height 5\' 4"  (1.626 m), weight 223  lb (101.152 kg), last menstrual period 11/27/2012.  General appearance: alert and no distress Neck: no adenopathy, no JVD, supple, symmetrical, trachea midline, thyroid not enlarged, symmetric, no tenderness/mass/nodules and soft left carotid bruit Lungs: clear to auscultation bilaterally Heart: regular rate and rhythm, S1, S2 normal, no murmur, click, rub or gallop Extremities: 1-2+ pitting edema bilaterally  EKG normal sinus rhythm at 95 with nonspecific ST and T-wave changes. I personally reviewed this EKG  ASSESSMENT AND PLAN:   S/P peripheral artery angioplasty - TurboHawk atherectomy; R SFA History of peripheral arterial disease with right lower extremity critical limb ischemia status post turbo hawk directional atherectomy 09/10/13 with ultimate healing of her ulcer. she does have a known 75% calcified left common femoral artery stenosis as well as a total left SFA with one-vessel runoff. She has 0 vessel runoff on the right. Her left lower extremity arterial Doppler studies were performed in November of last year revealing a right ABI of 0.52 and a left of 0.42. [name] She does complain of claudication.      [name] MD FACP,FACC,FAHA, FSCAI   H & P will be scanned in.  Pt was reexamined and existing H & P reviewed. No changes found.  [name], MD [facility] 01/15/2015 11:18 AM

## 2015-01-16 ENCOUNTER — Encounter (HOSPITAL_COMMUNITY): Payer: Self-pay | Admitting: Nurse Practitioner

## 2015-01-16 ENCOUNTER — Other Ambulatory Visit: Payer: Self-pay | Admitting: Nurse Practitioner

## 2015-01-16 DIAGNOSIS — I739 Peripheral vascular disease, unspecified: Secondary | ICD-10-CM

## 2015-01-16 DIAGNOSIS — I480 Paroxysmal atrial fibrillation: Secondary | ICD-10-CM

## 2015-01-16 DIAGNOSIS — I779 Disorder of arteries and arterioles, unspecified: Secondary | ICD-10-CM

## 2015-01-16 DIAGNOSIS — I70213 Atherosclerosis of native arteries of extremities with intermittent claudication, bilateral legs: Secondary | ICD-10-CM | POA: Diagnosis not present

## 2015-01-16 DIAGNOSIS — I6529 Occlusion and stenosis of unspecified carotid artery: Secondary | ICD-10-CM

## 2015-01-16 LAB — CBC
HCT: 32.3 % — ABNORMAL LOW (ref 36.0–46.0)
HEMOGLOBIN: 10.3 g/dL — AB (ref 12.0–15.0)
MCH: 29.1 pg (ref 26.0–34.0)
MCHC: 31.9 g/dL (ref 30.0–36.0)
MCV: 91.2 fL (ref 78.0–100.0)
PLATELETS: 314 10*3/uL (ref 150–400)
RBC: 3.54 MIL/uL — ABNORMAL LOW (ref 3.87–5.11)
RDW: 14.1 % (ref 11.5–15.5)
WBC: 9.6 10*3/uL (ref 4.0–10.5)

## 2015-01-16 LAB — BASIC METABOLIC PANEL
Anion gap: 5 (ref 5–15)
BUN: 25 mg/dL — ABNORMAL HIGH (ref 6–23)
CALCIUM: 8.1 mg/dL — AB (ref 8.4–10.5)
CHLORIDE: 102 mmol/L (ref 96–112)
CO2: 27 mmol/L (ref 19–32)
CREATININE: 0.96 mg/dL (ref 0.50–1.10)
GFR calc non Af Amer: 67 mL/min — ABNORMAL LOW (ref >90)
GFR, EST AFRICAN AMERICAN: 77 mL/min — AB (ref >90)
GLUCOSE: 268 mg/dL — AB (ref 70–99)
POTASSIUM: 3.7 mmol/L (ref 3.5–5.1)
Sodium: 134 mmol/L — ABNORMAL LOW (ref 135–145)

## 2015-01-16 LAB — GLUCOSE, CAPILLARY: Glucose-Capillary: 248 mg/dL — ABNORMAL HIGH (ref 70–99)

## 2015-01-16 MED ORDER — CLOPIDOGREL BISULFATE 75 MG PO TABS: 75.0000 mg | ORAL_TABLET | Freq: Every day | ORAL | Status: DC

## 2015-01-16 MED ORDER — METOPROLOL TARTRATE 25 MG PO TABS
50.0000 mg | ORAL_TABLET | Freq: Two times a day (BID) | ORAL | Status: DC
  Administered 2015-01-16: 10:00:00 50 mg via ORAL
  Filled 2015-01-16: qty 2

## 2015-01-16 MED ORDER — NITROGLYCERIN 0.4 MG SL SUBL: 0.4000 mg | SUBLINGUAL_TABLET | SUBLINGUAL | Status: DC | PRN

## 2015-01-16 MED ORDER — PRAVASTATIN SODIUM 40 MG PO TABS
40.0000 mg | ORAL_TABLET | Freq: Every day | ORAL | Status: DC
  Filled 2015-01-16: qty 1

## 2015-01-16 MED ORDER — METOPROLOL TARTRATE 50 MG PO TABS: 50.0000 mg | ORAL_TABLET | Freq: Two times a day (BID) | ORAL | Status: DC

## 2015-01-16 MED ORDER — PRAVASTATIN SODIUM 40 MG PO TABS: 40.0000 mg | ORAL_TABLET | Freq: Every day | ORAL | Status: DC

## 2015-01-16 NOTE — Care Management Note (Addendum)
    Page 1 of 1   01/16/2015     10:43:40 AM CARE MANAGEMENT NOTE 01/16/2015  Patient:  [name], [name]   Account Number:  [id number]  Date Initiated:  01/16/2015  Documentation initiated by:  GRAVES-BIGELOW,Teena Mangus  Subjective/Objective Assessment:   Pt admitted for Claudication.     Action/Plan:   Referral received for medication assistance. Pt goes to [facility]. Pt will not be able to use this clinic for meds. Will have to go to [facility]. Please make sure pt has all generic meds. Will provide a coupon for Good Rx   Anticipated DC Date:  01/16/2015   Anticipated DC Plan:  [place]  CM consult      Choice offered to / List presented to:             Status of service:  Completed, signed off Medicare Important Message given?  NO (If response is "NO", the following Medicare IM given date fields will be blank) Date Medicare IM given:   Medicare IM given by:   Date Additional Medicare IM given:   Additional Medicare IM given by:    Discharge Disposition:  HOME/SELF CARE  Per UR Regulation:  Reviewed for med. necessity/level of care/duration of stay  If discussed at [place] of Stay Meetings, dates discussed:    Comments:  Per pt has has money to get medications. Pt did have questions in regards to housing and CM did provide pt with the # to [employer]. Jacqlyn Krauss, RN,BN [phone]

## 2015-01-16 NOTE — Progress Notes (Signed)
Pt very restless, anxious, stating she needs a nicotine patch.  Pt states she smokes 1 - 1 1/2 ppd.  Dr Jules Husbands notified and order received.

## 2015-01-16 NOTE — Discharge Summary (Signed)
Discharge Summary   Patient ID: [name],  MRN: [id number], DOB/AGE: November 11, 1961 54 y.o.  Admit date: 01/15/2015 Discharge date: 01/16/2015  Primary Care Provider: Angelica Chessman Primary Cardiologist: P. Johnsie Cancel, MD / J. Gwenlyn Found, MD (PV)  Discharge Diagnoses Principal Problem:   Claudication  **Status post successful PTA and directional atherectomy of the right superficial femoral artery this admission.  Active Problems:   Atherosclerotic peripheral vascular disease with ulceration   Cigarette smoker   DM type 2, uncontrolled, with neuropathy   Carotid arterial disease   Chronic diastolic CHF (congestive heart failure)   COPD (chronic obstructive pulmonary disease)   PAF (paroxysmal atrial fibrillation)  **previously on amio/xarelto - self d/c'd 2/2 cost.   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 There was one-vessel runoff via a diffusely diseased peroneal artery. This represents a progressive "restenosis".   **The right superficial femoral artery was successfully treated with directional atherectomy and balloon angioplasty.** _____________   History of Present Illness  54 year old female with a history of severe peripheral arterial disease and ongoing tobacco abuse. She is previously status post directional atherectomy  of the entire right superficial femoral artery in October 2014.  Unfortunately, she developed recurrent claudication with abnormal ABIs last fall. She was seen back in clinic by Dr. Gwenlyn Found and decision was made to pursue repeat angiography.  Hospital Course  She presented to the [facility] peripheral vascular laboratory on 01/15/2015 and underwent diagnostic angiography. This revealed a subtotally occluded right superficial femoral artery throughout the entirety of the vessel with a patent above-the-knee popliteal artery. Left SFA was totally occluded with one-vessel runoff. Carotid angiography was also performed revealing an occluded right internal carotid artery and a 95% proximal stenosis in the left internal carotid artery. Attention was turned to the right superficial femoral artery which had restenosed since his prior procedure, and successful directional atherectomy and balloon angioplasty was carried out. Patient tolerated procedure well and has been handling this morning without recurrence of right lower extremity claudication. Of note, she has been wheezing this morning and we have discontinued her carvedilol in favor of a cardioselective beta blocker, metoprolol. We have also counseled her on the importance of smoking cessation though she is not sure that she is ready to quit. She will be discharged home today on aspirin and Plavix and we have arranged for follow-up lower extremity duplex next week with subsequent office follow-up in 2-3 weeks where further discussion related to carotid arterial disease may be had.  Discharge Vitals Blood pressure 138/72, pulse 95, temperature 98.1 F (36.7 C), temperature source Oral, resp. rate 18, height 5\' 4"  (1.626 m), weight 228 lb 6.3 oz (103.6 kg), last menstrual period 11/27/2012, SpO2 98 %.  Filed Weights   01/15/15 0920 01/16/15 0346  Weight: 230 lb (104.327 kg) 228 lb 6.3 oz (103.6 kg)    Labs  CBC  Recent Labs  01/16/15 0500  WBC 9.6  HGB  10.3*  HCT 32.3*  MCV 91.2  PLT Q000111Q   Basic Metabolic Panel  Recent Labs  01/16/15 0500  NA 134*  K 3.7  CL 102  CO2 27  GLUCOSE 268*  BUN 25*  CREATININE 0.96  CALCIUM 8.1*   Disposition  Pt is being discharged home today in good condition.  Follow-up Plans & Appointments  Follow-up Information    Follow up with [name], MD In 2 weeks.   Specialty:  Cardiology   Why:  we will arrange for follow-up and contact you.   Contact information:   [address] [place] [address] [phone]       Follow up with [facility] On 01/23/2015.   Why:  10:00 AM - Arterial Dopplers;  nothing to eat after midnight. no smoking.   Contact information:   [address] [place] [address] [phone]      Follow up with [facility] On 02/04/2015.   Why:  Hospital f/u appointment.    Contact information:   [place] [address] [phone]       Discharge Medications    Medication List    STOP taking these medications        carvedilol 6.25 MG tablet  Commonly known as:  COREG      TAKE these medications        aspirin 81 MG tablet  Take 81 mg by mouth daily.     clopidogrel 75 MG tablet  Commonly known as:  PLAVIX  Take 1 tablet (75 mg total) by mouth daily with breakfast.  Notes to Patient:  NEW MEDICINE     furosemide 40 MG tablet  Commonly known as:  LASIX  Take 1 tablet (40 mg total) by mouth 2 (two) times daily.     insulin glargine 100 UNIT/ML injection  Commonly known as:  LANTUS  Inject 35 Units into the skin at bedtime.     insulin NPH-regular Human (70-30) 100 UNIT/ML injection  Commonly known as:  NOVOLIN 70/30  Inject 4-10 Units into the skin 3 (three) times daily. Per sliding scale.     ipratropium-albuterol 0.5-2.5 (3) MG/3ML Soln  Commonly known as:  DUONEB  Take 3 mLs by nebulization every 6 (six) hours as needed (for shortness of  breath).     metFORMIN 1000 MG tablet  Commonly known as:  GLUCOPHAGE  Take 1,000 mg by mouth 2 (two) times daily with a meal.  Notes to Patient:  Resume 01/17/2015     metoprolol 50 MG tablet  Commonly known as:  LOPRESSOR  Take 1 tablet (50 mg total) by mouth 2 (two) times daily.  Notes to Patient:  NEW MEDICINE     nitroGLYCERIN 0.4 MG SL tablet  Commonly known as:  NITROSTAT  Place 1 tablet (0.4 mg total) under the tongue every 5 (five) minutes as needed for chest pain.     pravastatin 40 MG tablet  Commonly known as:  PRAVACHOL  Take 1 tablet (40 mg total) by mouth daily at 6 PM.  Notes to Patient:  NEW MEDICINE        Outstanding Labs/Studies  Follow-up lipids and LFTs in 6-8 weeks.  Duration of Discharge Encounter   Greater than 30 minutes including physician time.  Signed, Murray Hodgkins NP 01/16/2015, 10:13 AM   ATTENDING ATTESTATION:  I have seen and examined the patient this morning along with Mr. Sharolyn Douglas, NP-C. I  reviewed the chart and available data. The patient underwent lower extremity PTA as well as carotid angiography demonstrated severe bilateral carotid disease as noted above.  She says that she plans to quit smoking. What is unclear with the plan for carotid disease is, will defer to Dr. Gwenlyn Found in the outpatient setting. May very well need vascular surgery evaluation for CEA versus stenting. She is not on a statin, needs to be started on statin as an outpatient. Continue to consider generic pravastatin versus Crestor as it now is on low cost regimen per pharmaceutical representatives.  Resume diabetic medication.  She is completely vasculopath and is on beta blocker +2 and a platelet therapy and now with statin added. Agree with converting beta blocker to a beta 1 selective agents as she is actively wheezing. She probably needs COPD treatments for home as well.  Ready for discharge today with plan follow-up as previously described.  Agree with d/c  summary.  Leonie Man, M.D., M.S. Interventional Cardiologist   Pager # [phone]

## 2015-01-16 NOTE — Progress Notes (Signed)
Patient Name: [name] Date of Encounter: 01/16/2015     Principal Problem:   Claudication Active Problems:   Atherosclerotic peripheral vascular disease with ulceration   Cigarette smoker   DM type 2, uncontrolled, with neuropathy   Carotid arterial disease   Chronic diastolic CHF (congestive heart failure)   COPD (chronic obstructive pulmonary disease)   PAF (paroxysmal atrial fibrillation)    SUBJECTIVE  No right leg pain.  Has ambulated some around the room.  Eager to go home.  Says that she has quit smoking as of today.  CURRENT MEDS . aspirin EC  325 mg Oral Daily  . carvedilol  6.25 mg Oral BID WC  . clopidogrel  75 mg Oral Q breakfast  . furosemide  40 mg Oral BID  . insulin aspart  0-15 Units Subcutaneous TID WC  . insulin glargine  35 Units Subcutaneous QHS  . nicotine  14 mg Transdermal Daily    OBJECTIVE  Filed Vitals:   01/15/15 2000 01/15/15 2100 01/16/15 0041 01/16/15 0346  BP: 127/78 117/61 145/72 134/65  Pulse: 104 104 110 106  Temp:   98 F (36.7 C) 98.2 F (36.8 C)  TempSrc:   Oral Oral  Resp: 18 17 18 18   Height:      Weight:    228 lb 6.3 oz (103.6 kg)  SpO2: 99% 99% 100% 97%    Intake/Output Summary (Last 24 hours) at 01/16/15 0815 Last data filed at 01/16/15 0736  Gross per 24 hour  Intake   1745 ml  Output    951 ml  Net    794 ml   Filed Weights   01/15/15 0920 01/16/15 0346  Weight: 230 lb (104.327 kg) 228 lb 6.3 oz (103.6 kg)    PHYSICAL EXAM  General: Pleasant, NAD. Neuro: Alert and oriented X 3. Moves all extremities spontaneously. Psych: Normal affect. HEENT:  Normal  Neck: Supple without bruits or JVD. Lungs:  Resp regular and unlabored, insp/exp wheezing, scattered rhonchi. Heart: RRR no s3, s4, or murmurs. Abdomen: Soft, non-tender, non-distended, BS + x 4.  Extremities: No clubbing, cyanosis.  Trace bilat LE edema. DP/PT/Radials 1+ and equal bilaterally.  L groin cath site w/o  bleeding/bruit/hematoma.  Accessory Clinical Findings  CBC  Recent Labs  01/16/15 0500  WBC 9.6  HGB 10.3*  HCT 32.3*  MCV 91.2  PLT Q000111Q   Basic Metabolic Panel  Recent Labs  01/16/15 0500  NA 134*  K 3.7  CL 102  CO2 27  GLUCOSE 268*  BUN 25*  CREATININE 0.96  CALCIUM 8.1*   TELE  Rsr/sinus tach  Radiology/Studies  No results found.  ASSESSMENT AND PLAN  1.  PAD/RLE claudication:  S/p angiography revealing severe bilat SFA dzs with progressive restenosis w/in the R SFA.  This was successfully intervened upon using a drug coated balloon.  No pain or claudication this AM.  Labs ok.  Discussed importance of smoking cessation.  Cont asa/plavix.  F/U LE dopplers next wk and office provider f/u in 2-3 wks.  2.  Carotid Arterial Dzs:  Occluded RICA, [id number] LICA.  F/U Dr. Gwenlyn Found - will likely need vascular surgery evaluation.  3.  HTN:  Stable.  4.  HL:  Not on statin?  Says that she was on one at one point but came off b/c she couldn't afford.  Will add generic pravastatin.  Will need f/u lipids/lft's as outpt.  5.  DM:  Resume metformin in 48 hrs.  6.  Tob Abuse:  Cessation advised.  7. COPD:  Actively wheezing this AM.  Treat with inhaler, cont home inhalers.  Smoking cessation.  Will switch bb to metoprolol.  Signed, Murray Hodgkins NP   I have seen and examined the patient this morning along with Mr. Sharolyn Douglas, NP-C.  I reviewed the chart and available data. The patient underwent lower extremity PTA as well as carotid angiography demonstrated severe bilateral carotid disease as noted above.  She says that she plans to quit smoking. What is unclear with the plan for carotid disease is, will defer to Dr. Gwenlyn Found in the outpatient setting. May very well need vascular surgery evaluation for CEA versus stenting. She is not on a statin, needs to be started on statin as an outpatient. Continue to consider generic pravastatin versus Crestor as it now is on low cost regimen  per pharmaceutical representatives.  Resume diabetic medication.  She is completely vasculopath and is on beta blocker +2 and a platelet therapy and now with statin added. Agree with converting beta blocker to a beta 1 selective agents as she is actively wheezing. She probably needs COPD treatments for home as well.  Ready for discharge today with plan follow-up as previously described.  Leonie Man, M.D., M.S. Interventional Cardiologist   Pager # [phone]

## 2015-01-16 NOTE — Discharge Instructions (Signed)
**  PLEASE REMEMBER TO BRING ALL OF YOUR MEDICATIONS TO EACH OF YOUR FOLLOW-UP OFFICE VISITS. ° °Groin Site Care °Refer to this sheet in the next few weeks. These instructions provide you with information on caring for yourself after your procedure. Your caregiver may also give you more specific instructions. Your treatment has been planned according to current medical practices, but problems sometimes occur. Call your caregiver if you have any problems or questions after your procedure. °HOME CARE INSTRUCTIONS °· You may shower 24 hours after the procedure. Remove the bandage (dressing) and gently wash the site with plain soap and water. Gently pat the site dry.  °· Do not apply powder or lotion to the site.  °· Do not sit in a bathtub, swimming pool, or whirlpool for 5 to 7 days.  °· No bending, squatting, or lifting anything over 10 pounds (4.5 kg) as directed by your caregiver.  °· Inspect the site at least twice daily.  °· Do not drive home if you are discharged the same day of the procedure. Have someone else drive you.  °· You may drive 24 hours after the procedure unless otherwise instructed by your caregiver.  °What to expect: °· Any bruising will usually fade within 1 to 2 weeks.  °· Blood that collects in the tissue (hematoma) may be painful to the touch. It should usually decrease in size and tenderness within 1 to 2 weeks.  °SEEK IMMEDIATE MEDICAL CARE IF: °· You have unusual pain at the groin site or down the affected leg.  °· You have redness, warmth, swelling, or pain at the groin site.  °· You have drainage (other than a small amount of blood on the dressing).  °· You have chills.  °· You have a fever or persistent symptoms for more than 72 hours.  °· You have a fever and your symptoms suddenly get worse.  °· Your leg becomes pale, cool, tingly, or numb.  °You have heavy bleeding from the site. Hold pressure on the site. . ° °

## 2015-01-23 ENCOUNTER — Encounter: Payer: Self-pay | Admitting: Surgery

## 2015-01-23 ENCOUNTER — Ambulatory Visit (HOSPITAL_COMMUNITY)
Admission: RE | Admit: 2015-01-23 | Discharge: 2015-01-23 | Disposition: A | Discharge status: Home / Self Care | Payer: Medicaid Other | Source: Ambulatory Visit | Attending: Internal Medicine | Admitting: Internal Medicine

## 2015-01-23 DIAGNOSIS — I739 Peripheral vascular disease, unspecified: Secondary | ICD-10-CM | POA: Diagnosis not present

## 2015-01-23 NOTE — Progress Notes (Signed)
Right Lower Extremity Arterial Duplex Completed. °Brianna L Mazza,RVT °

## 2015-01-26 ENCOUNTER — Other Ambulatory Visit: Payer: Self-pay

## 2015-01-26 ENCOUNTER — Encounter: Payer: Self-pay | Admitting: Surgery

## 2015-01-26 ENCOUNTER — Ambulatory Visit (INDEPENDENT_AMBULATORY_CARE_PROVIDER_SITE_OTHER): Payer: Medicaid Other | Admitting: Surgery

## 2015-01-26 VITALS — BP 146/90 | HR 93 | Ht 64.0 in | Wt 224.0 lb

## 2015-01-26 DIAGNOSIS — I6523 Occlusion and stenosis of bilateral carotid arteries: Secondary | ICD-10-CM

## 2015-01-26 NOTE — Progress Notes (Signed)
Patient name: [name] MRN: [id number] DOB: May 13, 1961 Sex: female   Referred by: Dr. Gwenlyn Found  Reason for referral:  Chief Complaint  Patient presents with  . Carotid    new pt - carotid    HISTORY OF PRESENT ILLNESS: This is a 54 year old female who comes in today for evaluation of carotid occlusive disease.  She recently had an ultrasound was suggestive high-grade carotid disease.  Dr. Gwenlyn Found performed angiography which revealed an occluded right internal carotid artery and a 95% left carotid stenosis.  The patient is asymptomatic.  Specifically, she denies numbness or weakness in either extremity.  She denies slurred speech.  She denies amaurosis fugax.  The patient has poorly controlled diabetes.  Her last hemoglobin A1c was 8.1.  She suffers from COPD and uses an occasional inhaler.  She is trying to quit smoking.  Her hypercholesterolemia is managed with a statin.  She also suffers from peripheral vascular disease with ulceration and has undergone multiple percutaneous interventions.  She has a history of coronary artery disease.  Catheterization in December 2015 revealed small vessel disease.  She has atrial fibrillation and has undergone cardioversion 2.  She is on aspirin and Plavix.  Past Medical History  Diagnosis Date  . COPD (chronic obstructive pulmonary disease)   . Alcohol abuse   . Narcotic abuse   . Cocaine abuse   . Marijuana abuse   . Poorly controlled diabetes mellitus   . COPD (chronic obstructive pulmonary disease)   . Tobacco abuse   . Heart failure   . Alcoholic cirrhosis   . Cardiomyopathy   . Peripheral arterial disease     a. 01/2015 Angio/PTA: LSFA 100 w/ recon @ adductor canal and 1 vessel runoff via AT, RSFA 99 (atherectomy/pta) - 1 vessel runoff via diff dzs peroneal.  . Obesity   . CHF (congestive heart failure)   . Hypothyroidism   . GERD (gastroesophageal reflux disease)   . Headache(784.0)     "@ least once or twice/wk" (09/10/2013)    . Arthritis     "hands, arms, shoulders, legs, back" (09/10/2013)  . Atrial fibrillation   . Critical lower limb ischemia   . Peripheral arterial disease   . Hypertension   . CAD (coronary artery disease)     a. cath 11/10/2014 small vessel CAD. Demand ischemia in the setting of rapid a-fib  . Carotid artery disease     a. 01/2015 Carotid Angio: RICA 123XX123, LICA 99991111.  . Diabetes mellitus without complication     Past Surgical History  Procedure Laterality Date  . Cardioversion  ~ 02/2013    "twice"   . Lower extremity angiogram N/A 09/10/2013    Procedure: LOWER EXTREMITY ANGIOGRAM;  Surgeon: [name], MD;  Location: [facility] CATH LAB;  Service: Cardiovascular;  Laterality: N/A;  . Left heart catheterization with coronary angiogram N/A 10/31/2014    Procedure: LEFT HEART CATHETERIZATION WITH CORONARY ANGIOGRAM;  Surgeon: Burnell Blanks, MD;  Location: [facility] CATH LAB;  Service: Cardiovascular;  Laterality: N/A;  . Peripheral athrectomy Right 01/15/2015    SFA/notes 01/15/2015  . Balloon angioplasty, artery Right 01/15/2015    SFA/notes 01/15/2015  . Cardiac catheterization    . Lower extremity angiogram N/A 01/15/2015    Procedure: LOWER EXTREMITY ANGIOGRAM;  Surgeon: [name], MD;  Location: [facility] CATH LAB;  Service: Cardiovascular;  Laterality: N/A;  . Carotid angiogram N/A 01/15/2015    Procedure: CAROTID ANGIOGRAM;  Surgeon: [name], MD;  Location: [facility] CATH LAB;  Service: Cardiovascular;  Laterality: N/A;    History   Social History  . Marital Status: Divorced    Spouse Name: N/A  . Number of Children: N/A  . Years of Education: N/A   Occupational History  . disabled    Social History Main Topics  . Smoking status: Current Every Day Smoker -- 1.00 packs/day for 78 years    Types: Cigarettes  . Smokeless tobacco: Never Used  . Alcohol Use: 0.0 oz/week    0 Standard drinks or equivalent per week     Comment: 01/15/2015 "might drink 1 beer/wk; never had problem  w/it"  . Drug Use: Yes    Special: "Crack" cocaine, Marijuana     Comment: 01/15/2015 "last drug use was ~ 09/08/2013"  . Sexual Activity: Not Currently   Other Topics Concern  . Not on file   Social History Narrative    Family History  Problem Relation Age of Onset  . Hypertension Mother   . Diabetes Mother   . Cancer Mother     breast, ovarian, colon  . Clotting disorder Mother   . Heart disease Mother   . Heart attack Mother   . Hypertension Father   . Heart disease Father   . Emphysema Sister     smoked    Allergies as of 01/26/2015  . (No Known Allergies)    Current Outpatient Prescriptions on File Prior to Visit  Medication Sig Dispense Refill  . aspirin 81 MG tablet Take 81 mg by mouth daily.    . clopidogrel (PLAVIX) 75 MG tablet Take 1 tablet (75 mg total) by mouth daily with breakfast. 30 tablet 6  . insulin glargine (LANTUS) 100 UNIT/ML injection Inject 35 Units into the skin at bedtime.    . metFORMIN (GLUCOPHAGE) 1000 MG tablet Take 1,000 mg by mouth 2 (two) times daily with a meal.    . metoprolol tartrate (LOPRESSOR) 50 MG tablet Take 1 tablet (50 mg total) by mouth 2 (two) times daily. 60 tablet 6  . nitroGLYCERIN (NITROSTAT) 0.4 MG SL tablet Place 1 tablet (0.4 mg total) under the tongue every 5 (five) minutes as needed for chest pain. 25 tablet 3  . pravastatin (PRAVACHOL) 40 MG tablet Take 1 tablet (40 mg total) by mouth daily at 6 PM. 30 tablet 6  . furosemide (LASIX) 40 MG tablet Take 1 tablet (40 mg total) by mouth 2 (two) times daily. (Patient not taking: Reported on 01/26/2015) 60 tablet 11  . insulin NPH-regular Human (NOVOLIN 70/30) (70-30) 100 UNIT/ML injection Inject 4-10 Units into the skin 3 (three) times daily. Per sliding scale.    [name] ipratropium-albuterol (DUONEB) 0.5-2.5 (3) MG/3ML SOLN Take 3 mLs by nebulization every 6 (six) hours as needed (for shortness of breath). (Patient not taking: Reported on 01/26/2015) 360 mL 0   No current  facility-administered medications on file prior to visit.     REVIEW OF SYSTEMS: Cardiovascular: Positive for chest pain, leg pain with walking and lying flat and leg swelling Pulmonary: No productive cough, asthma or wheezing. Neurologic: Positive for leg weakness and numbness. No dizziness. Hematologic: No bleeding problems or clotting disorders. Musculoskeletal: No joint pain or joint swelling. Gastrointestinal: No blood in stool or hematemesis Genitourinary: Positive dysuria no hematuria. Psychiatric:: No history of major depression. Integumentary: No rashes or ulcers. Constitutional: No fever or chills.  PHYSICAL EXAMINATION: General: The patient appears their stated age.  Vital signs are BP 146/90 mmHg  Pulse 93  Ht 5\' 4"  (1.626 m)  Wt 224 lb (101.606 kg)  BMI 38.43 kg/m2  SpO2 100%  LMP 11/27/2012 HEENT:  No gross abnormalities Pulmonary: Respirations are non-labored Musculoskeletal: There are no major deformities.   Neurologic: No focal weakness or paresthesias are detected, Skin: There are no ulcer or rashes noted. Psychiatric: The patient has normal affect. Cardiovascular: There is a regular rate and rhythm without significant murmur appreciated.  No carotid bruit  Diagnostic Studies: I have reviewed the patient's angiogram.  She has an occluded right carotid artery and a 95% left carotid stenosis which is rather high, at the level of the mandible.  It is around a bend in the artery   Assessment:  Asymptomatic left carotid stenosis Plan: I discussed the treatment options with the patient including medical management, surgical endarterectomy and stenting.  I do not think she is a good candidate for stenting as this lesion occurs around a tortuous artery.  We discussed proceeding with endarterectomy.  We discussed the risk and benefits of the operation including the risk of stroke and nerve injury.  All of her questions were answered.  I will stop her Plavix 5 days  prior to her operation which is been scheduled for Thursday, April seventh      V. Leia Alf, M.D. [facility] Office: [phone] Pager:  [phone]

## 2015-01-29 DIAGNOSIS — S62102A Fracture of unspecified carpal bone, left wrist, initial encounter for closed fracture: Secondary | ICD-10-CM | POA: Insufficient documentation

## 2015-01-29 HISTORY — DX: Fracture of unspecified carpal bone, left wrist, initial encounter for closed fracture: S62.102A

## 2015-02-05 ENCOUNTER — Other Ambulatory Visit: Payer: Self-pay | Admitting: Cardiovascular Disease

## 2015-02-05 DIAGNOSIS — S62102D Fracture of unspecified carpal bone, left wrist, subsequent encounter for fracture with routine healing: Secondary | ICD-10-CM | POA: Insufficient documentation

## 2015-02-05 MED ORDER — CARVEDILOL 6.25 MG PO TABS: 6.2500 mg | ORAL_TABLET | Freq: Two times a day (BID) | ORAL | Status: DC

## 2015-02-05 NOTE — Telephone Encounter (Signed)
°  1. Which medications need to be refilled? Carvedilol-need this today  2. Which pharmacy is medication to be sent to?[facility]-Did not know the phone number 3. Do they need a 30 day or 90 day supply? #60 and refills  4. Would they like a call back once the medication has been sent to the pharmacy? no

## 2015-02-05 NOTE — Telephone Encounter (Signed)
Refills sent to [facility] for carvedilol.

## 2015-02-05 NOTE — Telephone Encounter (Signed)
Refill for Carvedilol sent to [facility].

## 2015-02-07 DIAGNOSIS — J101 Influenza due to other identified influenza virus with other respiratory manifestations: Secondary | ICD-10-CM | POA: Insufficient documentation

## 2015-02-08 DIAGNOSIS — IMO0002 Reserved for concepts with insufficient information to code with codable children: Secondary | ICD-10-CM | POA: Insufficient documentation

## 2015-02-08 DIAGNOSIS — E1165 Type 2 diabetes mellitus with hyperglycemia: Secondary | ICD-10-CM | POA: Insufficient documentation

## 2015-02-09 ENCOUNTER — Ambulatory Visit: Payer: Self-pay | Admitting: Cardiology

## 2015-02-11 ENCOUNTER — Inpatient Hospital Stay (HOSPITAL_COMMUNITY)
Admission: RE | Admit: 2015-02-11 | Discharge: 2015-02-11 | Disposition: A | Discharge status: Home / Self Care | Payer: Medicaid Other | Source: Ambulatory Visit

## 2015-02-16 ENCOUNTER — Encounter: Payer: Self-pay | Admitting: *Deleted

## 2015-02-17 ENCOUNTER — Encounter (HOSPITAL_COMMUNITY): Payer: Self-pay

## 2015-02-17 ENCOUNTER — Encounter (HOSPITAL_COMMUNITY)
Admission: RE | Admit: 2015-02-17 | Discharge: 2015-02-17 | Disposition: A | Discharge status: Home / Self Care | Payer: Medicaid Other | Source: Ambulatory Visit | Attending: Surgery | Admitting: Surgery

## 2015-02-17 ENCOUNTER — Ambulatory Visit (HOSPITAL_COMMUNITY)
Admission: RE | Admit: 2015-02-17 | Discharge: 2015-02-17 | Disposition: A | Discharge status: Home / Self Care | Payer: Medicaid Other | Source: Ambulatory Visit | Attending: Surgery | Admitting: Surgery

## 2015-02-17 DIAGNOSIS — I509 Heart failure, unspecified: Secondary | ICD-10-CM | POA: Insufficient documentation

## 2015-02-17 DIAGNOSIS — I517 Cardiomegaly: Secondary | ICD-10-CM

## 2015-02-17 DIAGNOSIS — J449 Chronic obstructive pulmonary disease, unspecified: Secondary | ICD-10-CM | POA: Insufficient documentation

## 2015-02-17 DIAGNOSIS — Z01818 Encounter for other preprocedural examination: Secondary | ICD-10-CM

## 2015-02-17 HISTORY — DX: Anxiety disorder, unspecified: F41.9

## 2015-02-17 HISTORY — DX: Pneumonia, unspecified organism: J18.9

## 2015-02-17 LAB — CBC
HEMATOCRIT: 33.3 % — AB (ref 36.0–46.0)
HEMOGLOBIN: 10.2 g/dL — AB (ref 12.0–15.0)
MCH: 27.9 pg (ref 26.0–34.0)
MCHC: 30.6 g/dL (ref 30.0–36.0)
MCV: 91 fL (ref 78.0–100.0)
Platelets: 348 10*3/uL (ref 150–400)
RBC: 3.66 MIL/uL — ABNORMAL LOW (ref 3.87–5.11)
RDW: 13.8 % (ref 11.5–15.5)
WBC: 8.6 10*3/uL (ref 4.0–10.5)

## 2015-02-17 LAB — PROTIME-INR
INR: 1.05 (ref 0.00–1.49)
Prothrombin Time: 13.8 seconds (ref 11.6–15.2)

## 2015-02-17 LAB — COMPREHENSIVE METABOLIC PANEL
ALT: 27 U/L (ref 0–35)
AST: 16 U/L (ref 0–37)
Albumin: 2.7 g/dL — ABNORMAL LOW (ref 3.5–5.2)
Alkaline Phosphatase: 221 U/L — ABNORMAL HIGH (ref 39–117)
Anion gap: 9 (ref 5–15)
BILIRUBIN TOTAL: 0.4 mg/dL (ref 0.3–1.2)
BUN: 18 mg/dL (ref 6–23)
CALCIUM: 8.5 mg/dL (ref 8.4–10.5)
CHLORIDE: 105 mmol/L (ref 96–112)
CO2: 26 mmol/L (ref 19–32)
CREATININE: 1.03 mg/dL (ref 0.50–1.10)
GFR, EST AFRICAN AMERICAN: 71 mL/min — AB (ref >90)
GFR, EST NON AFRICAN AMERICAN: 61 mL/min — AB (ref >90)
GLUCOSE: 92 mg/dL (ref 70–99)
Potassium: 4.1 mmol/L (ref 3.5–5.1)
Sodium: 140 mmol/L (ref 135–145)
Total Protein: 6.5 g/dL (ref 6.0–8.3)

## 2015-02-17 LAB — URINALYSIS, ROUTINE W REFLEX MICROSCOPIC
BILIRUBIN URINE: NEGATIVE
Glucose, UA: NEGATIVE mg/dL
Hgb urine dipstick: NEGATIVE
Ketones, ur: NEGATIVE mg/dL
LEUKOCYTES UA: NEGATIVE
NITRITE: NEGATIVE
PH: 5 (ref 5.0–8.0)
Protein, ur: 30 mg/dL — AB
Specific Gravity, Urine: 1.012 (ref 1.005–1.030)
Urobilinogen, UA: 0.2 mg/dL (ref 0.0–1.0)

## 2015-02-17 LAB — APTT: aPTT: 28 seconds (ref 24–37)

## 2015-02-17 LAB — URINE MICROSCOPIC-ADD ON

## 2015-02-17 LAB — TYPE AND SCREEN
ABO/RH(D): A POS
ANTIBODY SCREEN: NEGATIVE

## 2015-02-17 LAB — SURGICAL PCR SCREEN
MRSA, PCR: NEGATIVE
Staphylococcus aureus: POSITIVE — AB

## 2015-02-17 LAB — ABO/RH: ABO/RH(D): A POS

## 2015-02-17 NOTE — Progress Notes (Signed)
SPOKE WITH STEPHANIE AT DR. BRABHAM'S OFFICE WHO STATED PATIENT NEEDED TO STOP PLAVIX BUT CONTINUE ASPIRIN UP UNTIL DOS. PATIENT SHOULD NOT TAKE ASPIRIN DOS AND HAS BEEN INSTRUCTED.

## 2015-02-17 NOTE — Progress Notes (Signed)
Mupirocin Ointment Rx called into [facility] on [address] for positive PCR of staph. Pt's sister, Dalene Seltzer was notified and will give pt the information. She voiced understanding.

## 2015-02-17 NOTE — Pre-Procedure Instructions (Signed)
[name]  02/17/2015   Your procedure is scheduled on:    Thursday  02/19/15  Report to [facility] Admitting at 850 AM.  Call this number if you have problems the morning of surgery: [phone]   Remember:   Do not eat food or drink liquids after midnight.   Take these medicines the morning of surgery with A SIP OF WATER:   CARVEDILOL (COREG), BREATHING TREATMENT, METOPROLOL(LOPRESSOR),  STOP PLAVIX !!!   Do not wear jewelry, make-up or nail polish.  Do not wear lotions, powders, or perfumes. You may wear deodorant.  Do not shave 48 hours prior to surgery. Men may shave face and neck.  Do not bring valuables to the hospital.  [facility] is not responsible                  for any belongings or valuables.               Contacts, dentures or bridgework may not be worn into surgery.  Leave suitcase in the car. After surgery it may be brought to your room.  For patients admitted to the hospital, discharge time is determined by your                treatment team.               Patients discharged the day of surgery will not be allowed to drive  home.  Name and phone number of your driver:  Special Instructions: [place] - Preparing for Surgery  Before surgery, you can play an important role.  Because skin is not sterile, your skin needs to be as free of germs as possible.  You can reduce the number of germs on you skin by washing with CHG (chlorahexidine gluconate) soap before surgery.  CHG is an antiseptic cleaner which kills germs and bonds with the skin to continue killing germs even after washing.  Please DO NOT use if you have an allergy to CHG or antibacterial soaps.  If your skin becomes reddened/irritated stop using the CHG and inform your nurse when you arrive at Short Stay.  Do not shave (including legs and underarms) for at least 48 hours prior to the first CHG shower.  You may shave your face.  Please follow these instructions carefully:   1.  Shower with  CHG Soap the night before surgery and the                                morning of Surgery.  2.  If you choose to wash your hair, wash your hair first as usual with your       normal shampoo.  3.  After you shampoo, rinse your hair and body thoroughly to remove the                      Shampoo.  4.  Use CHG as you would any other liquid soap.  You can apply chg directly       to the skin and wash gently with scrungie or a clean washcloth.  5.  Apply the CHG Soap to your body ONLY FROM THE NECK DOWN.        Do not use on open wounds or open sores.  Avoid contact with your eyes,       ears, mouth and genitals (private parts).  Wash  genitals (private parts)       with your normal soap.  6.  Wash thoroughly, paying special attention to the area where your surgery        will be performed.  7.  Thoroughly rinse your body with warm water from the neck down.  8.  DO NOT shower/wash with your normal soap after using and rinsing off       the CHG Soap.  9.  Pat yourself dry with a clean towel.            10.  Wear clean pajamas.            11.  Place clean sheets on your bed the night of your first shower and do not        sleep with pets.  Day of Surgery  Do not apply any lotions/deoderants the morning of surgery.  Please wear clean clothes to the hospital/surgery center.     Please read over the following fact sheets that you were given: Pain Booklet, Coughing and Deep Breathing, Blood Transfusion Information, MRSA Information and Surgical Site Infection Prevention

## 2015-02-18 MED ORDER — CHLORHEXIDINE GLUCONATE CLOTH 2 % EX PADS: 6.0000 | MEDICATED_PAD | Freq: Once | CUTANEOUS | Status: DC

## 2015-02-18 MED ORDER — DEXTROSE 5 % IV SOLN
1.5000 g | INTRAVENOUS | Status: AC
  Administered 2015-02-19: 1.5 g via INTRAVENOUS
  Filled 2015-02-18: qty 1.5

## 2015-02-18 MED ORDER — SODIUM CHLORIDE 0.9 % IV SOLN: INTRAVENOUS | Status: DC

## 2015-02-19 ENCOUNTER — Inpatient Hospital Stay (HOSPITAL_COMMUNITY)
Admission: RE | Admit: 2015-02-19 | Discharge: 2015-02-21 | DRG: 038 | Disposition: A | Discharge status: Home / Self Care | Payer: Medicaid Other | Source: Ambulatory Visit | Attending: Surgery | Admitting: Surgery

## 2015-02-19 ENCOUNTER — Encounter (HOSPITAL_COMMUNITY)
Admission: RE | Disposition: A | Discharge status: Home / Self Care | Payer: Self-pay | Source: Ambulatory Visit | Attending: Surgery

## 2015-02-19 ENCOUNTER — Inpatient Hospital Stay (HOSPITAL_COMMUNITY): Payer: Medicaid Other | Admitting: Anesthesiology

## 2015-02-19 DIAGNOSIS — I10 Essential (primary) hypertension: Secondary | ICD-10-CM | POA: Diagnosis present

## 2015-02-19 DIAGNOSIS — E039 Hypothyroidism, unspecified: Secondary | ICD-10-CM | POA: Diagnosis present

## 2015-02-19 DIAGNOSIS — K219 Gastro-esophageal reflux disease without esophagitis: Secondary | ICD-10-CM | POA: Diagnosis present

## 2015-02-19 DIAGNOSIS — I2510 Atherosclerotic heart disease of native coronary artery without angina pectoris: Secondary | ICD-10-CM | POA: Diagnosis present

## 2015-02-19 DIAGNOSIS — Z6841 Body Mass Index (BMI) 40.0 and over, adult: Secondary | ICD-10-CM | POA: Diagnosis not present

## 2015-02-19 DIAGNOSIS — E669 Obesity, unspecified: Secondary | ICD-10-CM | POA: Diagnosis present

## 2015-02-19 DIAGNOSIS — K7030 Alcoholic cirrhosis of liver without ascites: Secondary | ICD-10-CM | POA: Diagnosis present

## 2015-02-19 DIAGNOSIS — I739 Peripheral vascular disease, unspecified: Secondary | ICD-10-CM | POA: Diagnosis present

## 2015-02-19 DIAGNOSIS — I248 Other forms of acute ischemic heart disease: Secondary | ICD-10-CM | POA: Diagnosis present

## 2015-02-19 DIAGNOSIS — E119 Type 2 diabetes mellitus without complications: Secondary | ICD-10-CM | POA: Diagnosis present

## 2015-02-19 DIAGNOSIS — I6522 Occlusion and stenosis of left carotid artery: Secondary | ICD-10-CM | POA: Diagnosis not present

## 2015-02-19 DIAGNOSIS — I509 Heart failure, unspecified: Secondary | ICD-10-CM | POA: Diagnosis present

## 2015-02-19 DIAGNOSIS — Z7982 Long term (current) use of aspirin: Secondary | ICD-10-CM | POA: Diagnosis not present

## 2015-02-19 DIAGNOSIS — I6523 Occlusion and stenosis of bilateral carotid arteries: Principal | ICD-10-CM | POA: Diagnosis present

## 2015-02-19 DIAGNOSIS — M1990 Unspecified osteoarthritis, unspecified site: Secondary | ICD-10-CM | POA: Diagnosis present

## 2015-02-19 DIAGNOSIS — J449 Chronic obstructive pulmonary disease, unspecified: Secondary | ICD-10-CM | POA: Diagnosis present

## 2015-02-19 DIAGNOSIS — I4891 Unspecified atrial fibrillation: Secondary | ICD-10-CM | POA: Diagnosis present

## 2015-02-19 DIAGNOSIS — F419 Anxiety disorder, unspecified: Secondary | ICD-10-CM | POA: Diagnosis present

## 2015-02-19 DIAGNOSIS — T148XXA Other injury of unspecified body region, initial encounter: Secondary | ICD-10-CM

## 2015-02-19 DIAGNOSIS — I429 Cardiomyopathy, unspecified: Secondary | ICD-10-CM | POA: Diagnosis present

## 2015-02-19 DIAGNOSIS — E780 Pure hypercholesterolemia: Secondary | ICD-10-CM | POA: Diagnosis present

## 2015-02-19 DIAGNOSIS — Z8701 Personal history of pneumonia (recurrent): Secondary | ICD-10-CM | POA: Diagnosis not present

## 2015-02-19 DIAGNOSIS — F17210 Nicotine dependence, cigarettes, uncomplicated: Secondary | ICD-10-CM | POA: Diagnosis present

## 2015-02-19 HISTORY — PX: ENDARTERECTOMY: SHX5162

## 2015-02-19 LAB — CREATININE, SERUM
CREATININE: 1.02 mg/dL (ref 0.50–1.10)
GFR calc Af Amer: 72 mL/min — ABNORMAL LOW (ref >90)
GFR calc non Af Amer: 62 mL/min — ABNORMAL LOW (ref >90)

## 2015-02-19 LAB — CBC
HEMATOCRIT: 28.7 % — AB (ref 36.0–46.0)
Hemoglobin: 8.9 g/dL — ABNORMAL LOW (ref 12.0–15.0)
MCH: 28.2 pg (ref 26.0–34.0)
MCHC: 31 g/dL (ref 30.0–36.0)
MCV: 90.8 fL (ref 78.0–100.0)
Platelets: 286 10*3/uL (ref 150–400)
RBC: 3.16 MIL/uL — ABNORMAL LOW (ref 3.87–5.11)
RDW: 14 % (ref 11.5–15.5)
WBC: 11.3 10*3/uL — ABNORMAL HIGH (ref 4.0–10.5)

## 2015-02-19 LAB — GLUCOSE, CAPILLARY
GLUCOSE-CAPILLARY: 191 mg/dL — AB (ref 70–99)
GLUCOSE-CAPILLARY: 210 mg/dL — AB (ref 70–99)
Glucose-Capillary: 189 mg/dL — ABNORMAL HIGH (ref 70–99)

## 2015-02-19 IMAGING — CR DG CHEST 2V
2 series · 2 of 2 positions shown · non-contrast
Comparison: Chest x-ray 02/01/2014.

CLINICAL DATA: Cough.  Shortness of breath.

EXAM:
CHEST  2 VIEW

[w chest pa]
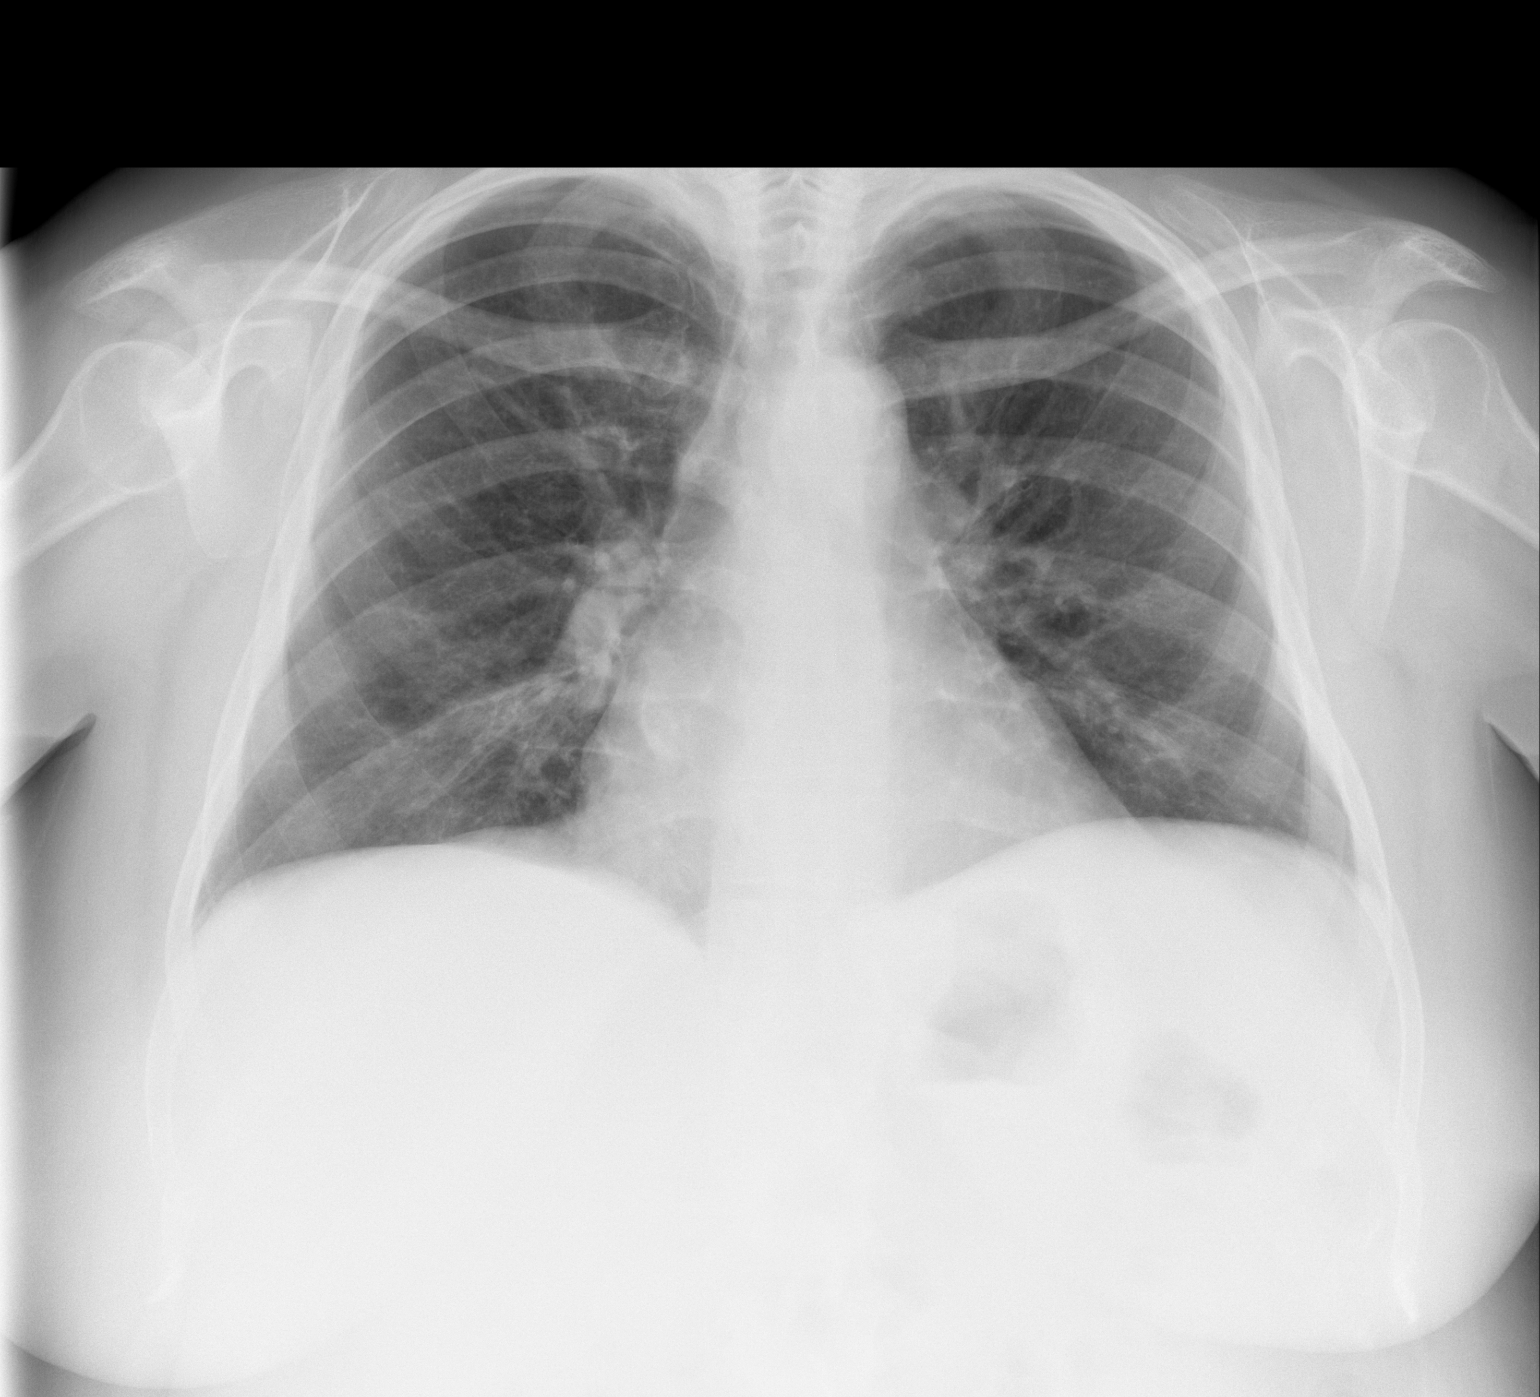

[w chest lat]
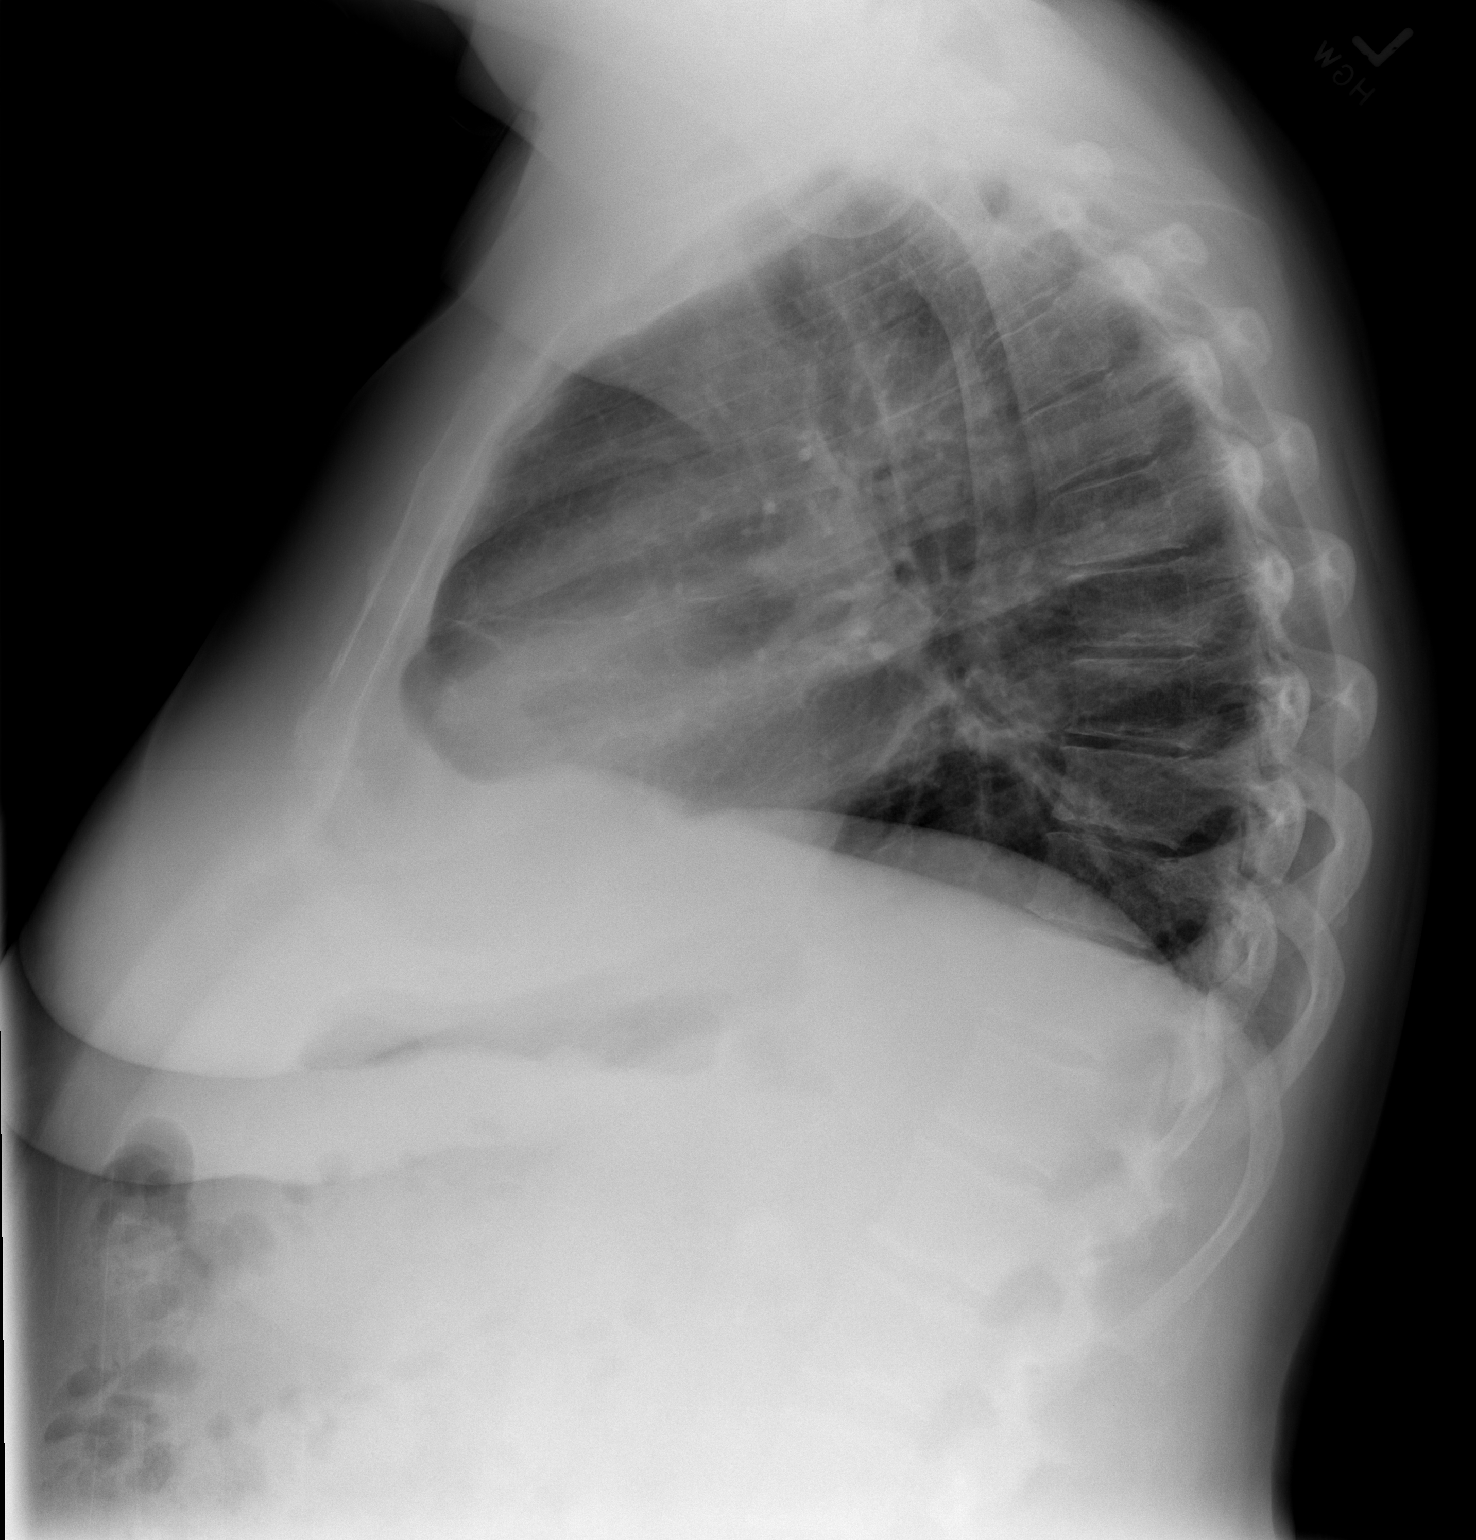

[2 of 2 positions shown; findings below may reference images not displayed]

FINDINGS: Lung volumes are normal. No consolidative airspace disease. No
pleural effusions. No pneumothorax. No pulmonary nodule or mass
noted. Pulmonary vasculature and the cardiomediastinal silhouette
are within normal limits.
IMPRESSION: No radiographic evidence of acute cardiopulmonary disease.

## 2015-02-19 SURGERY — ENDARTERECTOMY, CAROTID
Anesthesia: General | Site: Neck | Laterality: Left

## 2015-02-19 MED ORDER — PANTOPRAZOLE SODIUM 40 MG PO TBEC
40.0000 mg | DELAYED_RELEASE_TABLET | Freq: Every day | ORAL | Status: DC
  Administered 2015-02-20 – 2015-02-21 (×2): 40 mg via ORAL
  Filled 2015-02-19 (×2): qty 1

## 2015-02-19 MED ORDER — EPHEDRINE SULFATE 50 MG/ML IJ SOLN
INTRAMUSCULAR | Status: DC | PRN
  Administered 2015-02-19 (×4): 5 mg via INTRAVENOUS

## 2015-02-19 MED ORDER — BISACODYL 10 MG RE SUPP
10.0000 mg | Freq: Every day | RECTAL | Status: DC | PRN
Start: 2015-02-19 — End: 2015-02-21

## 2015-02-19 MED ORDER — PHENYLEPHRINE 40 MCG/ML (10ML) SYRINGE FOR IV PUSH (FOR BLOOD PRESSURE SUPPORT)
PREFILLED_SYRINGE | INTRAVENOUS | Status: AC
  Filled 2015-02-19: qty 20

## 2015-02-19 MED ORDER — PROTAMINE SULFATE 10 MG/ML IV SOLN
INTRAVENOUS | Status: DC | PRN
  Administered 2015-02-19: 50 mg via INTRAVENOUS

## 2015-02-19 MED ORDER — ALBUTEROL SULFATE HFA 108 (90 BASE) MCG/ACT IN AERS
INHALATION_SPRAY | RESPIRATORY_TRACT | Status: DC | PRN
  Administered 2015-02-19: 2 via RESPIRATORY_TRACT

## 2015-02-19 MED ORDER — PRAVASTATIN SODIUM 40 MG PO TABS
40.0000 mg | ORAL_TABLET | Freq: Every day | ORAL | Status: DC
  Administered 2015-02-20: 40 mg via ORAL
  Filled 2015-02-19 (×3): qty 1

## 2015-02-19 MED ORDER — PHENOL 1.4 % MT LIQD
1.0000 | OROMUCOSAL | Status: DC | PRN
  Administered 2015-02-19: 1 via OROMUCOSAL
  Filled 2015-02-19: qty 177

## 2015-02-19 MED ORDER — PHENYLEPHRINE HCL 10 MG/ML IJ SOLN
INTRAMUSCULAR | Status: DC | PRN
  Administered 2015-02-19 (×3): 80 ug via INTRAVENOUS

## 2015-02-19 MED ORDER — METOPROLOL TARTRATE 1 MG/ML IV SOLN: 2.0000 mg | INTRAVENOUS | Status: DC | PRN

## 2015-02-19 MED ORDER — LIDOCAINE HCL (CARDIAC) 20 MG/ML IV SOLN
INTRAVENOUS | Status: DC | PRN
  Administered 2015-02-19: 80 mg via INTRAVENOUS

## 2015-02-19 MED ORDER — PHENYLEPHRINE HCL 10 MG/ML IJ SOLN
INTRAMUSCULAR | Status: AC
  Filled 2015-02-19: qty 1

## 2015-02-19 MED ORDER — SODIUM CHLORIDE 0.9 % IR SOLN
Status: DC | PRN
  Administered 2015-02-19: 500 mL

## 2015-02-19 MED ORDER — CARVEDILOL 6.25 MG PO TABS
6.2500 mg | ORAL_TABLET | Freq: Two times a day (BID) | ORAL | Status: DC
  Administered 2015-02-20 – 2015-02-21 (×3): 6.25 mg via ORAL
  Filled 2015-02-19 (×6): qty 1

## 2015-02-19 MED ORDER — ALBUMIN HUMAN 5 % IV SOLN
INTRAVENOUS | Status: DC | PRN
  Administered 2015-02-19 (×2): via INTRAVENOUS

## 2015-02-19 MED ORDER — SUCCINYLCHOLINE CHLORIDE 20 MG/ML IJ SOLN
INTRAMUSCULAR | Status: AC
  Filled 2015-02-19: qty 1

## 2015-02-19 MED ORDER — INSULIN ASPART 100 UNIT/ML ~~LOC~~ SOLN
0.0000 [IU] | Freq: Three times a day (TID) | SUBCUTANEOUS | Status: DC
  Administered 2015-02-20 (×2): 2 [IU] via SUBCUTANEOUS
  Administered 2015-02-20: 3 [IU] via SUBCUTANEOUS
  Administered 2015-02-21: 2 [IU] via SUBCUTANEOUS

## 2015-02-19 MED ORDER — ASPIRIN EC 81 MG PO TBEC
81.0000 mg | DELAYED_RELEASE_TABLET | Freq: Every day | ORAL | Status: DC
  Administered 2015-02-20 – 2015-02-21 (×2): 81 mg via ORAL
  Filled 2015-02-19 (×3): qty 1

## 2015-02-19 MED ORDER — MAGNESIUM SULFATE 2 GM/50ML IV SOLN: 2.0000 g | Freq: Every day | INTRAVENOUS | Status: DC | PRN

## 2015-02-19 MED ORDER — PROMETHAZINE HCL 25 MG/ML IJ SOLN
6.2500 mg | INTRAMUSCULAR | Status: DC | PRN
Start: 2015-02-19 — End: 2015-02-19

## 2015-02-19 MED ORDER — HYDROMORPHONE HCL 1 MG/ML IJ SOLN
0.2500 mg | INTRAMUSCULAR | Status: DC | PRN
  Administered 2015-02-19 (×2): 0.5 mg via INTRAVENOUS

## 2015-02-19 MED ORDER — IPRATROPIUM-ALBUTEROL 0.5-2.5 (3) MG/3ML IN SOLN
3.0000 mL | Freq: Four times a day (QID) | RESPIRATORY_TRACT | Status: DC | PRN
  Administered 2015-02-20: 3 mL via RESPIRATORY_TRACT
  Filled 2015-02-19: qty 3

## 2015-02-19 MED ORDER — GLYCOPYRROLATE 0.2 MG/ML IJ SOLN
INTRAMUSCULAR | Status: AC
  Filled 2015-02-19: qty 5

## 2015-02-19 MED ORDER — HEPARIN SODIUM (PORCINE) 1000 UNIT/ML IJ SOLN
INTRAMUSCULAR | Status: DC | PRN
  Administered 2015-02-19: 10 mL via INTRAVENOUS

## 2015-02-19 MED ORDER — ACETAMINOPHEN 325 MG PO TABS
325.0000 mg | ORAL_TABLET | ORAL | Status: DC | PRN
  Administered 2015-02-19 – 2015-02-20 (×2): 650 mg via ORAL
  Filled 2015-02-19 (×2): qty 2

## 2015-02-19 MED ORDER — MORPHINE SULFATE 2 MG/ML IJ SOLN
2.0000 mg | INTRAMUSCULAR | Status: DC | PRN
  Administered 2015-02-19: 2 mg via INTRAVENOUS
  Administered 2015-02-19: 4 mg via INTRAVENOUS
  Administered 2015-02-19: 2 mg via INTRAVENOUS
  Administered 2015-02-20: 4 mg via INTRAVENOUS
  Administered 2015-02-20: 2 mg via INTRAVENOUS
  Filled 2015-02-19: qty 1
  Filled 2015-02-19 (×2): qty 2
  Filled 2015-02-19 (×2): qty 1

## 2015-02-19 MED ORDER — LACTATED RINGERS IV SOLN
INTRAVENOUS | Status: DC
  Administered 2015-02-19: 50 mL/h via INTRAVENOUS

## 2015-02-19 MED ORDER — OXYCODONE-ACETAMINOPHEN 5-325 MG PO TABS
1.0000 | ORAL_TABLET | ORAL | Status: DC | PRN
  Administered 2015-02-20 – 2015-02-21 (×4): 2 via ORAL
  Filled 2015-02-19 (×4): qty 2

## 2015-02-19 MED ORDER — ROCURONIUM BROMIDE 50 MG/5ML IV SOLN
INTRAVENOUS | Status: AC
  Filled 2015-02-19: qty 1

## 2015-02-19 MED ORDER — OXYCODONE HCL 5 MG/5ML PO SOLN: 5.0000 mg | Freq: Once | ORAL | Status: DC | PRN

## 2015-02-19 MED ORDER — NEOSTIGMINE METHYLSULFATE 10 MG/10ML IV SOLN
INTRAVENOUS | Status: AC
  Filled 2015-02-19: qty 2

## 2015-02-19 MED ORDER — LIDOCAINE HCL 4 % MT SOLN
OROMUCOSAL | Status: DC | PRN
  Administered 2015-02-19: 4 mL via TOPICAL

## 2015-02-19 MED ORDER — LIDOCAINE HCL (CARDIAC) 20 MG/ML IV SOLN
INTRAVENOUS | Status: AC
  Filled 2015-02-19: qty 5

## 2015-02-19 MED ORDER — SODIUM CHLORIDE 0.9 % IV SOLN
INTRAVENOUS | Status: DC
  Administered 2015-02-19: 17:00:00 via INTRAVENOUS

## 2015-02-19 MED ORDER — MIDAZOLAM HCL 2 MG/2ML IJ SOLN
INTRAMUSCULAR | Status: AC
  Filled 2015-02-19: qty 2

## 2015-02-19 MED ORDER — ONDANSETRON HCL 4 MG/2ML IJ SOLN
INTRAMUSCULAR | Status: DC | PRN
  Administered 2015-02-19: 4 mg via INTRAVENOUS

## 2015-02-19 MED ORDER — LACTATED RINGERS IV SOLN
INTRAVENOUS | Status: DC | PRN
  Administered 2015-02-19 (×2): via INTRAVENOUS

## 2015-02-19 MED ORDER — PROPOFOL 10 MG/ML IV BOLUS
INTRAVENOUS | Status: DC | PRN
  Administered 2015-02-19: 120 mg via INTRAVENOUS

## 2015-02-19 MED ORDER — METOPROLOL TARTRATE 50 MG PO TABS
50.0000 mg | ORAL_TABLET | Freq: Two times a day (BID) | ORAL | Status: DC
  Administered 2015-02-19 – 2015-02-21 (×4): 50 mg via ORAL
  Filled 2015-02-19 (×5): qty 1

## 2015-02-19 MED ORDER — SENNOSIDES-DOCUSATE SODIUM 8.6-50 MG PO TABS
1.0000 | ORAL_TABLET | Freq: Every evening | ORAL | Status: DC | PRN
  Filled 2015-02-19: qty 1

## 2015-02-19 MED ORDER — PNEUMOCOCCAL VAC POLYVALENT 25 MCG/0.5ML IJ INJ
0.5000 mL | INJECTION | INTRAMUSCULAR | Status: AC
  Administered 2015-02-20: 0.5 mL via INTRAMUSCULAR
  Filled 2015-02-19: qty 0.5

## 2015-02-19 MED ORDER — POTASSIUM CHLORIDE CRYS ER 20 MEQ PO TBCR: 20.0000 meq | EXTENDED_RELEASE_TABLET | Freq: Every day | ORAL | Status: DC | PRN

## 2015-02-19 MED ORDER — OXYCODONE HCL 5 MG PO TABS: 5.0000 mg | ORAL_TABLET | Freq: Once | ORAL | Status: DC | PRN

## 2015-02-19 MED ORDER — METFORMIN HCL 500 MG PO TABS
1000.0000 mg | ORAL_TABLET | Freq: Two times a day (BID) | ORAL | Status: DC
  Administered 2015-02-20 – 2015-02-21 (×3): 1000 mg via ORAL
  Filled 2015-02-19 (×6): qty 2

## 2015-02-19 MED ORDER — ROCURONIUM BROMIDE 100 MG/10ML IV SOLN
INTRAVENOUS | Status: DC | PRN
  Administered 2015-02-19: 20 mg via INTRAVENOUS
  Administered 2015-02-19: 30 mg via INTRAVENOUS

## 2015-02-19 MED ORDER — DOCUSATE SODIUM 100 MG PO CAPS
100.0000 mg | ORAL_CAPSULE | Freq: Every day | ORAL | Status: DC
Start: 2015-02-20 — End: 2015-02-21
  Administered 2015-02-20 – 2015-02-21 (×2): 100 mg via ORAL
  Filled 2015-02-19 (×2): qty 1

## 2015-02-19 MED ORDER — PROPOFOL 10 MG/ML IV BOLUS
INTRAVENOUS | Status: AC
  Filled 2015-02-19: qty 20

## 2015-02-19 MED ORDER — FUROSEMIDE 40 MG PO TABS
40.0000 mg | ORAL_TABLET | Freq: Two times a day (BID) | ORAL | Status: DC
  Administered 2015-02-20 – 2015-02-21 (×3): 40 mg via ORAL
  Filled 2015-02-19 (×6): qty 1

## 2015-02-19 MED ORDER — PROTAMINE SULFATE 10 MG/ML IV SOLN
INTRAVENOUS | Status: AC
  Filled 2015-02-19: qty 10

## 2015-02-19 MED ORDER — FENTANYL CITRATE 0.05 MG/ML IJ SOLN
INTRAMUSCULAR | Status: DC | PRN
  Administered 2015-02-19 (×4): 50 ug via INTRAVENOUS

## 2015-02-19 MED ORDER — HYDRALAZINE HCL 20 MG/ML IJ SOLN: 5.0000 mg | INTRAMUSCULAR | Status: DC | PRN

## 2015-02-19 MED ORDER — EPHEDRINE SULFATE 50 MG/ML IJ SOLN
INTRAMUSCULAR | Status: AC
  Filled 2015-02-19: qty 1

## 2015-02-19 MED ORDER — LABETALOL HCL 5 MG/ML IV SOLN
10.0000 mg | INTRAVENOUS | Status: DC | PRN
Start: 2015-02-19 — End: 2015-02-21

## 2015-02-19 MED ORDER — OXYCODONE HCL 5 MG PO TABS: 5.0000 mg | ORAL_TABLET | Freq: Four times a day (QID) | ORAL | Status: DC | PRN

## 2015-02-19 MED ORDER — HYDROMORPHONE HCL 1 MG/ML IJ SOLN
INTRAMUSCULAR | Status: AC
  Filled 2015-02-19: qty 1

## 2015-02-19 MED ORDER — NEOSTIGMINE METHYLSULFATE 10 MG/10ML IV SOLN
INTRAVENOUS | Status: DC | PRN
  Administered 2015-02-19: 5 mg via INTRAVENOUS

## 2015-02-19 MED ORDER — ONDANSETRON HCL 4 MG/2ML IJ SOLN
INTRAMUSCULAR | Status: AC
  Filled 2015-02-19: qty 2

## 2015-02-19 MED ORDER — ONDANSETRON HCL 4 MG/2ML IJ SOLN
4.0000 mg | Freq: Four times a day (QID) | INTRAMUSCULAR | Status: DC | PRN
  Administered 2015-02-19 – 2015-02-20 (×3): 4 mg via INTRAVENOUS
  Filled 2015-02-19 (×4): qty 2

## 2015-02-19 MED ORDER — FENTANYL CITRATE 0.05 MG/ML IJ SOLN
INTRAMUSCULAR | Status: AC
  Filled 2015-02-19: qty 5

## 2015-02-19 MED ORDER — STERILE WATER FOR INJECTION IJ SOLN
INTRAMUSCULAR | Status: AC
  Filled 2015-02-19: qty 10

## 2015-02-19 MED ORDER — HEMOSTATIC AGENTS (NO CHARGE) OPTIME
TOPICAL | Status: DC | PRN
  Administered 2015-02-19: 1 via TOPICAL

## 2015-02-19 MED ORDER — ENOXAPARIN SODIUM 30 MG/0.3ML ~~LOC~~ SOLN
30.0000 mg | SUBCUTANEOUS | Status: DC
  Administered 2015-02-20 – 2015-02-21 (×2): 30 mg via SUBCUTANEOUS
  Filled 2015-02-19 (×3): qty 0.3

## 2015-02-19 MED ORDER — DEXTROSE 5 % IV SOLN
1.5000 g | Freq: Two times a day (BID) | INTRAVENOUS | Status: AC
  Administered 2015-02-19 – 2015-02-20 (×2): 1.5 g via INTRAVENOUS
  Filled 2015-02-19 (×2): qty 1.5

## 2015-02-19 MED ORDER — GLYCOPYRROLATE 0.2 MG/ML IJ SOLN
INTRAMUSCULAR | Status: DC | PRN
  Administered 2015-02-19: .8 mg via INTRAVENOUS

## 2015-02-19 MED ORDER — SODIUM CHLORIDE 0.9 % IV SOLN: 500.0000 mL | Freq: Once | INTRAVENOUS | Status: AC | PRN

## 2015-02-19 MED ORDER — INSULIN GLARGINE 100 UNIT/ML ~~LOC~~ SOLN
35.0000 [IU] | Freq: Every day | SUBCUTANEOUS | Status: DC
  Administered 2015-02-19 – 2015-02-20 (×2): 35 [IU] via SUBCUTANEOUS
  Filled 2015-02-19 (×3): qty 0.35

## 2015-02-19 MED ORDER — ACETAMINOPHEN 650 MG RE SUPP: 325.0000 mg | RECTAL | Status: DC | PRN

## 2015-02-19 MED ORDER — 0.9 % SODIUM CHLORIDE (POUR BTL) OPTIME
TOPICAL | Status: DC | PRN
  Administered 2015-02-19: 2000 mL

## 2015-02-19 MED ORDER — LIDOCAINE HCL (PF) 1 % IJ SOLN
INTRAMUSCULAR | Status: AC
  Filled 2015-02-19: qty 30

## 2015-02-19 MED ORDER — CLOPIDOGREL BISULFATE 75 MG PO TABS
75.0000 mg | ORAL_TABLET | Freq: Every day | ORAL | Status: DC
  Administered 2015-02-20 – 2015-02-21 (×2): 75 mg via ORAL
  Filled 2015-02-19 (×3): qty 1

## 2015-02-19 MED ORDER — ROCURONIUM BROMIDE 50 MG/5ML IV SOLN
INTRAVENOUS | Status: AC
  Filled 2015-02-19: qty 2

## 2015-02-19 MED ORDER — ALUM & MAG HYDROXIDE-SIMETH 200-200-20 MG/5ML PO SUSP
15.0000 mL | ORAL | Status: DC | PRN
  Administered 2015-02-20: 30 mL via ORAL
  Filled 2015-02-19: qty 30

## 2015-02-19 MED ORDER — NITROGLYCERIN 0.4 MG SL SUBL: 0.4000 mg | SUBLINGUAL_TABLET | SUBLINGUAL | Status: DC | PRN

## 2015-02-19 MED ORDER — HEPARIN SODIUM (PORCINE) 1000 UNIT/ML IJ SOLN
INTRAMUSCULAR | Status: AC
  Filled 2015-02-19: qty 1

## 2015-02-19 MED ORDER — PHENYLEPHRINE HCL 10 MG/ML IJ SOLN
10.0000 mg | INTRAMUSCULAR | Status: DC | PRN
  Administered 2015-02-19: 25 ug/min via INTRAVENOUS

## 2015-02-19 MED ORDER — GUAIFENESIN-DM 100-10 MG/5ML PO SYRP: 15.0000 mL | ORAL_SOLUTION | ORAL | Status: DC | PRN

## 2015-02-19 SURGICAL SUPPLY — 50 items
CANISTER SUCTION 2500CC (MISCELLANEOUS) ×3 IMPLANT
CATH ROBINSON RED A/P 18FR (CATHETERS) ×3 IMPLANT
CATH SUCT 10FR WHISTLE TIP (CATHETERS) ×3 IMPLANT
CLIP TI MEDIUM 6 (CLIP) ×3 IMPLANT
CLIP TI WIDE RED SMALL 6 (CLIP) ×6 IMPLANT
CRADLE DONUT ADULT HEAD (MISCELLANEOUS) ×3 IMPLANT
DRAIN CHANNEL 15F RND FF W/TCR (WOUND CARE) IMPLANT
ELECT REM PT RETURN 9FT ADLT (ELECTROSURGICAL) ×3
ELECTRODE REM PT RTRN 9FT ADLT (ELECTROSURGICAL) ×1 IMPLANT
EVACUATOR SILICONE 100CC (DRAIN) IMPLANT
GAUZE SPONGE 4X4 12PLY STRL (GAUZE/BANDAGES/DRESSINGS) ×3 IMPLANT
GLOVE BIO SURGEON STRL SZ 6.5 (GLOVE) ×2 IMPLANT
GLOVE BIO SURGEONS STRL SZ 6.5 (GLOVE) ×1
GLOVE BIOGEL PI IND STRL 6.5 (GLOVE) ×3 IMPLANT
GLOVE BIOGEL PI IND STRL 7.5 (GLOVE) ×2 IMPLANT
GLOVE BIOGEL PI INDICATOR 6.5 (GLOVE) ×6
GLOVE BIOGEL PI INDICATOR 7.5 (GLOVE) ×4
GLOVE ECLIPSE 6.5 STRL STRAW (GLOVE) ×3 IMPLANT
GLOVE ECLIPSE 7.0 STRL STRAW (GLOVE) ×3 IMPLANT
GLOVE ECLIPSE 7.5 STRL STRAW (GLOVE) ×9 IMPLANT
GLOVE SURG SS PI 6.5 STRL IVOR (GLOVE) ×3 IMPLANT
GLOVE SURG SS PI 7.0 STRL IVOR (GLOVE) ×3 IMPLANT
GLOVE SURG SS PI 7.5 STRL IVOR (GLOVE) ×3 IMPLANT
GOWN STRL REUS W/ TWL LRG LVL3 (GOWN DISPOSABLE) ×4 IMPLANT
GOWN STRL REUS W/ TWL XL LVL3 (GOWN DISPOSABLE) ×3 IMPLANT
GOWN STRL REUS W/TWL LRG LVL3 (GOWN DISPOSABLE) ×8
GOWN STRL REUS W/TWL XL LVL3 (GOWN DISPOSABLE) ×6
HEMOSTAT SNOW SURGICEL 2X4 (HEMOSTASIS) ×3 IMPLANT
INSERT FOGARTY SM (MISCELLANEOUS) IMPLANT
KIT BASIN OR (CUSTOM PROCEDURE TRAY) ×3 IMPLANT
KIT ROOM TURNOVER OR (KITS) ×3 IMPLANT
LIQUID BAND (GAUZE/BANDAGES/DRESSINGS) ×3 IMPLANT
NEEDLE HYPO 25GX1X1/2 BEV (NEEDLE) IMPLANT
NS IRRIG 1000ML POUR BTL (IV SOLUTION) ×6 IMPLANT
PACK CAROTID (CUSTOM PROCEDURE TRAY) ×3 IMPLANT
PAD ARMBOARD 7.5X6 YLW CONV (MISCELLANEOUS) ×6 IMPLANT
PATCH VASCULAR VASCU GUARD 1X6 (Vascular Products) ×3 IMPLANT
SHUNT CAROTID BYPASS 10 (VASCULAR PRODUCTS) IMPLANT
SHUNT CAROTID BYPASS 12FRX15.5 (VASCULAR PRODUCTS) IMPLANT
SPONGE INTESTINAL PEANUT (DISPOSABLE) ×3 IMPLANT
SUT ETHILON 3 0 PS 1 (SUTURE) ×3 IMPLANT
SUT PROLENE 6 0 BV (SUTURE) ×6 IMPLANT
SUT PROLENE 7 0 BV 1 (SUTURE) IMPLANT
SUT SILK 3 0 TIES 17X18 (SUTURE)
SUT SILK 3-0 18XBRD TIE BLK (SUTURE) IMPLANT
SUT VIC AB 3-0 SH 27 (SUTURE) ×4
SUT VIC AB 3-0 SH 27X BRD (SUTURE) ×2 IMPLANT
SUT VICRYL 4-0 PS2 18IN ABS (SUTURE) ×3 IMPLANT
SYR CONTROL 10ML LL (SYRINGE) IMPLANT
WATER STERILE IRR 1000ML POUR (IV SOLUTION) ×3 IMPLANT

## 2015-02-19 NOTE — Anesthesia Postprocedure Evaluation (Signed)
  Anesthesia Post-op Note  Patient: [name]  Procedure(s) Performed: Procedure(s): LEFT CAROTID ENDARTERECTOMY  (Left)  Patient Location: PACU  Anesthesia Type:General  Level of Consciousness: awake and alert   Airway and Oxygen Therapy: Patient Spontanous Breathing  Post-op Pain: mild  Post-op Assessment: Post-op Vital signs reviewed  Post-op Vital Signs: Reviewed  Last Vitals:  Filed Vitals:   02/19/15 1600  BP: 105/63  Pulse: 64  Temp:   Resp: 17    Complications: No apparent anesthesia complications

## 2015-02-19 NOTE — Anesthesia Procedure Notes (Signed)
Procedure Name: Intubation Date/Time: 02/19/2015 11:07 AM Performed by: Clearnce Sorrel Pre-anesthesia Checklist: Patient identified, Emergency Drugs available, Suction available, Patient being monitored and Timeout performed Patient Re-evaluated:Patient Re-evaluated prior to inductionOxygen Delivery Method: Circle system utilized Preoxygenation: Pre-oxygenation with 100% oxygen Intubation Type: IV induction Ventilation: Mask ventilation without difficulty and Oral airway inserted - appropriate to patient size Laryngoscope Size: Mac and 3 Grade View: Grade I Tube type: Oral Tube size: 7.0 mm Number of attempts: 1 Placement Confirmation: ETT inserted through vocal cords under direct vision,  positive ETCO2 and breath sounds checked- equal and bilateral Secured at: 23 cm Tube secured with: Tape Dental Injury: Teeth and Oropharynx as per pre-operative assessment

## 2015-02-19 NOTE — Anesthesia Preprocedure Evaluation (Addendum)
Anesthesia Evaluation  Patient identified by MRN, date of birth, ID band Patient awake    Reviewed: Allergy & Precautions, NPO status , Patient's Chart, lab work & pertinent test results, reviewed documented beta blocker date and time   Airway Mallampati: I  TM Distance: >3 FB Neck ROM: Full    Dental  (+) Edentulous Upper, Edentulous Lower   Pulmonary shortness of breath and with exertion, pneumonia -, resolved, COPDformer smoker,  breath sounds clear to auscultation        Cardiovascular hypertension, Pt. on medications and Pt. on home beta blockers + CAD, + Peripheral Vascular Disease and +CHF Rhythm:Regular Rate:Normal  12/15 TTE: EF 40-45%    Neuro/Psych  Headaches, Anxiety    GI/Hepatic Neg liver ROS, GERD-  ,  Endo/Other  diabetes, Type 2, Insulin DependentHypothyroidism Morbid obesity  Renal/GU      Musculoskeletal  (+) Arthritis -,   Abdominal   Peds  Hematology  (+) anemia , Hgb 10.2   Anesthesia Other Findings   Reproductive/Obstetrics                            Anesthesia Physical Anesthesia Plan  ASA: III  Anesthesia Plan: General   Post-op Pain Management:    Induction: Intravenous  Airway Management Planned: Oral ETT  Additional Equipment: Arterial line  Intra-op Plan:   Post-operative Plan: Extubation in OR and Possible Post-op intubation/ventilation  Informed Consent: I have reviewed the patients History and Physical, chart, labs and discussed the procedure including the risks, benefits and alternatives for the proposed anesthesia with the patient or authorized representative who has indicated his/her understanding and acceptance.   Dental advisory given  Plan Discussed with: CRNA  Anesthesia Plan Comments:        Anesthesia Quick Evaluation

## 2015-02-19 NOTE — Progress Notes (Signed)
  Day of Surgery Note    Subjective:  C/o pain like a tooth ache around jaw  Filed Vitals:   02/19/15 1528  BP: 105/55  Pulse: 71  Temp:   Resp: 15    Incisions:   C/d/i with some mild fullness around incision Extremities:  5/5 BUE/BLE Lungs:  Non labored Neuro:  In tact; tongue is midline   Assessment/Plan:  This is a 54 y.o. female who is s/p left CEA  -pt doing well and is neurologically intact -to [place] when bed available -anticipate d/c tomorrow    Leontine Locket, PA-C 02/19/2015 3:31 PM

## 2015-02-19 NOTE — Interval H&P Note (Signed)
History and Physical Interval Note:  02/19/2015 10:27 AM  [name]  has presented today for surgery, with the diagnosis of Left internal carotid artery stenosis I65.22  The various methods of treatment have been discussed with the patient and family. After consideration of risks, benefits and other options for treatment, the patient has consented to  Procedure(s): ENDARTERECTOMY CAROTID (Left) as a surgical intervention .  The patient's history has been reviewed, patient examined, no change in status, stable for surgery.  I have reviewed the patient's chart and labs.  Questions were answered to the patient's satisfaction.     Dierre Crevier IV, V. WELLS

## 2015-02-19 NOTE — H&P (View-Only) (Signed)
Patient name: [name] MRN: [id number] DOB: 1961-07-05 Sex: female   Referred by: Dr. Gwenlyn Found  Reason for referral:  Chief Complaint  Patient presents with  . Carotid    new pt - carotid    HISTORY OF PRESENT ILLNESS: This is a 54 year old female who comes in today for evaluation of carotid occlusive disease.  She recently had an ultrasound was suggestive high-grade carotid disease.  Dr. Gwenlyn Found performed angiography which revealed an occluded right internal carotid artery and a 95% left carotid stenosis.  The patient is asymptomatic.  Specifically, she denies numbness or weakness in either extremity.  She denies slurred speech.  She denies amaurosis fugax.  The patient has poorly controlled diabetes.  Her last hemoglobin A1c was 8.1.  She suffers from COPD and uses an occasional inhaler.  She is trying to quit smoking.  Her hypercholesterolemia is managed with a statin.  She also suffers from peripheral vascular disease with ulceration and has undergone multiple percutaneous interventions.  She has a history of coronary artery disease.  Catheterization in December 2015 revealed small vessel disease.  She has atrial fibrillation and has undergone cardioversion 2.  She is on aspirin and Plavix.  Past Medical History  Diagnosis Date  . COPD (chronic obstructive pulmonary disease)   . Alcohol abuse   . Narcotic abuse   . Cocaine abuse   . Marijuana abuse   . Poorly controlled diabetes mellitus   . COPD (chronic obstructive pulmonary disease)   . Tobacco abuse   . Heart failure   . Alcoholic cirrhosis   . Cardiomyopathy   . Peripheral arterial disease     a. 01/2015 Angio/PTA: LSFA 100 w/ recon @ adductor canal and 1 vessel runoff via AT, RSFA 99 (atherectomy/pta) - 1 vessel runoff via diff dzs peroneal.  . Obesity   . CHF (congestive heart failure)   . Hypothyroidism   . GERD (gastroesophageal reflux disease)   . Headache(784.0)     "@ least once or twice/wk" (09/10/2013)    . Arthritis     "hands, arms, shoulders, legs, back" (09/10/2013)  . Atrial fibrillation   . Critical lower limb ischemia   . Peripheral arterial disease   . Hypertension   . CAD (coronary artery disease)     a. cath 11/10/2014 small vessel CAD. Demand ischemia in the setting of rapid a-fib  . Carotid artery disease     a. 01/2015 Carotid Angio: RICA [id number], LICA [id number].  . Diabetes mellitus without complication     Past Surgical History  Procedure Laterality Date  . Cardioversion  ~ 02/2013    "twice"   . Lower extremity angiogram N/A 09/10/2013    Procedure: LOWER EXTREMITY ANGIOGRAM;  Surgeon: [name], MD;  Location: [facility] CATH LAB;  Service: Cardiovascular;  Laterality: N/A;  . Left heart catheterization with coronary angiogram N/A 10/31/2014    Procedure: LEFT HEART CATHETERIZATION WITH CORONARY ANGIOGRAM;  Surgeon: Burnell Blanks, MD;  Location: [facility] CATH LAB;  Service: Cardiovascular;  Laterality: N/A;  . Peripheral athrectomy Right 01/15/2015    SFA/notes 01/15/2015  . Balloon angioplasty, artery Right 01/15/2015    SFA/notes 01/15/2015  . Cardiac catheterization    . Lower extremity angiogram N/A 01/15/2015    Procedure: LOWER EXTREMITY ANGIOGRAM;  Surgeon: [name], MD;  Location: [facility] CATH LAB;  Service: Cardiovascular;  Laterality: N/A;  . Carotid angiogram N/A 01/15/2015    Procedure: CAROTID ANGIOGRAM;  Surgeon: [name], MD;  Location: [facility] CATH LAB;  Service: Cardiovascular;  Laterality: N/A;    History   Social History  . Marital Status: Divorced    Spouse Name: N/A  . Number of Children: N/A  . Years of Education: N/A   Occupational History  . disabled    Social History Main Topics  . Smoking status: Current Every Day Smoker -- 1.00 packs/day for 78 years    Types: Cigarettes  . Smokeless tobacco: Never Used  . Alcohol Use: 0.0 oz/week    0 Standard drinks or equivalent per week     Comment: 01/15/2015 "might drink 1 beer/wk; never had problem  w/it"  . Drug Use: Yes    Special: "Crack" cocaine, Marijuana     Comment: 01/15/2015 "last drug use was ~ 09/08/2013"  . Sexual Activity: Not Currently   Other Topics Concern  . Not on file   Social History Narrative    Family History  Problem Relation Age of Onset  . Hypertension Mother   . Diabetes Mother   . Cancer Mother     breast, ovarian, colon  . Clotting disorder Mother   . Heart disease Mother   . Heart attack Mother   . Hypertension Father   . Heart disease Father   . Emphysema Sister     smoked    Allergies as of 01/26/2015  . (No Known Allergies)    Current Outpatient Prescriptions on File Prior to Visit  Medication Sig Dispense Refill  . aspirin 81 MG tablet Take 81 mg by mouth daily.    . clopidogrel (PLAVIX) 75 MG tablet Take 1 tablet (75 mg total) by mouth daily with breakfast. 30 tablet 6  . insulin glargine (LANTUS) 100 UNIT/ML injection Inject 35 Units into the skin at bedtime.    . metFORMIN (GLUCOPHAGE) 1000 MG tablet Take 1,000 mg by mouth 2 (two) times daily with a meal.    . metoprolol tartrate (LOPRESSOR) 50 MG tablet Take 1 tablet (50 mg total) by mouth 2 (two) times daily. 60 tablet 6  . nitroGLYCERIN (NITROSTAT) 0.4 MG SL tablet Place 1 tablet (0.4 mg total) under the tongue every 5 (five) minutes as needed for chest pain. 25 tablet 3  . pravastatin (PRAVACHOL) 40 MG tablet Take 1 tablet (40 mg total) by mouth daily at 6 PM. 30 tablet 6  . furosemide (LASIX) 40 MG tablet Take 1 tablet (40 mg total) by mouth 2 (two) times daily. (Patient not taking: Reported on 01/26/2015) 60 tablet 11  . insulin NPH-regular Human (NOVOLIN 70/30) (70-30) 100 UNIT/ML injection Inject 4-10 Units into the skin 3 (three) times daily. Per sliding scale.    [name] ipratropium-albuterol (DUONEB) 0.5-2.5 (3) MG/3ML SOLN Take 3 mLs by nebulization every 6 (six) hours as needed (for shortness of breath). (Patient not taking: Reported on 01/26/2015) 360 mL 0   No current  facility-administered medications on file prior to visit.     REVIEW OF SYSTEMS: Cardiovascular: Positive for chest pain, leg pain with walking and lying flat and leg swelling Pulmonary: No productive cough, asthma or wheezing. Neurologic: Positive for leg weakness and numbness. No dizziness. Hematologic: No bleeding problems or clotting disorders. Musculoskeletal: No joint pain or joint swelling. Gastrointestinal: No blood in stool or hematemesis Genitourinary: Positive dysuria no hematuria. Psychiatric:: No history of major depression. Integumentary: No rashes or ulcers. Constitutional: No fever or chills.  PHYSICAL EXAMINATION: General: The patient appears their stated age.  Vital signs are BP 146/90 mmHg  Pulse 93  Ht 5\' 4"  (1.626 m)  Wt 224 lb (101.606 kg)  BMI 38.43 kg/m2  SpO2 100%  LMP 11/27/2012 HEENT:  No gross abnormalities Pulmonary: Respirations are non-labored Musculoskeletal: There are no major deformities.   Neurologic: No focal weakness or paresthesias are detected, Skin: There are no ulcer or rashes noted. Psychiatric: The patient has normal affect. Cardiovascular: There is a regular rate and rhythm without significant murmur appreciated.  No carotid bruit  Diagnostic Studies: I have reviewed the patient's angiogram.  She has an occluded right carotid artery and a 95% left carotid stenosis which is rather high, at the level of the mandible.  It is around a bend in the artery   Assessment:  Asymptomatic left carotid stenosis Plan: I discussed the treatment options with the patient including medical management, surgical endarterectomy and stenting.  I do not think she is a good candidate for stenting as this lesion occurs around a tortuous artery.  We discussed proceeding with endarterectomy.  We discussed the risk and benefits of the operation including the risk of stroke and nerve injury.  All of her questions were answered.  I will stop her Plavix 5 days  prior to her operation which is been scheduled for Thursday, April seventh      V. Leia Alf, M.D. [facility] [place] Office: [phone] Pager:  [phone]

## 2015-02-19 NOTE — Transfer of Care (Signed)
Immediate Anesthesia Transfer of Care Note  Patient: [name]  Procedure(s) Performed: Procedure(s): LEFT CAROTID ENDARTERECTOMY  (Left)  Patient Location: PACU  Anesthesia Type:General  Level of Consciousness: awake, alert  and oriented  Airway & Oxygen Therapy: Patient Spontanous Breathing and Patient connected to face mask oxygen  Post-op Assessment: Report given to RN and Post -op Vital signs reviewed and stable  Post vital signs: Reviewed and stable  Last Vitals:  Filed Vitals:   02/19/15 0840  BP: 161/98  Pulse: 76  Temp: 36.5 C  Resp: 18    Complications: No apparent anesthesia complications

## 2015-02-19 NOTE — Op Note (Signed)
Patient name: [name] MRN: [id number] DOB: 25-Jul-1961 Sex: female  02/19/2015 Pre-operative Diagnosis: Asymptomatic   left carotid stenosis Post-operative diagnosis:  Same Surgeon:  Eldridge Abrahams Assistants:  Silva Bandy, M. Collins Procedure:    left carotid Endarterectomy with bovine pericardial patch angioplasty Anesthesia:  General Blood Loss:  See anesthesia record Specimens:  Carotid Plaque to pathology  Findings:  95 %stenosis; Thrombus:  no  Indications:  54 yo with occluded right carotid occlusion and 95% left carotid stenosis by angiography.  Procedure:  The patient was identified in the holding area and taken to [place] [facility]  The patient was then placed supine on the table.   General endotrachial anesthesia was administered.  The patient was prepped and draped in the usual sterile fashion.  A time out was called and antibiotics were administered.  The incision was made along the anterior border of the left sternocleidomastoid muscle.  Cautery was used to dissect through the subcutaneous tissue.  The platysma muscle was divided with cautery.  The internal jugular vein was exposed along its anterior medial border.  The common facial vein was exposed and then divided between 2-0 silk ties and metal clips.  The common carotid artery was then circumferentially exposed and encircled with an umbilical tape.  The vagus nerve was identified and protected.  Next sharp dissection was used to expose the external carotid artery and the superior thyroid artery.  The were encircled with a blue vessel loop and a 2-0 silk tie respectively.  Finally, the internal carotid was carefully dissected free.  An umbilical tape was placed around the internal carotid artery distal to the diseased segment.  The hypoglossal nerve was visualized throughout and protected.  The patient was given systemic heparinization.  A bovine carotid patch was selected and prepared on the back table.  A 10 french shunt  was also prepared.  After blood pressure readings were appropriate and the heparin had been given time to circulate, the internal carotid artery was occluded with a baby Gregory clamp.  The external and common carotid arteries were then occluded with vascular clamps and the 2-0 tie tightened on the superior thyroid artery.  A #11 blade was used to make an arteriotomy in the common carotid artery.  This was extended with Potts scissors along the anterior and lateral border of the common and internal carotid artery.  Approximately 95% stenosis was identified.  There was no thrombus identified.  The 10 french shunt was then placed.  A kleiner kuntz elevator was used to perform endarterectomy.  An eversion endarterectomy was performed in the external carotid artery.  A good distal endpoint was obtained in the internal carotid artery.  The specimen was removed and sent to pathology.  Heparinized saline was used to irrigate the endarterectomized field.  All potential embolic debris was removed.  Bovine pericardial patch angioplasty was then performed using a running 6-0 Prolene. Just prior to completion of the repair, the shunt was removed. The common internal and external carotid arteries were all appropriately flushed. The artery was again irrigated with heparin saline.  The anastomosis was then secured. The clamp was first released on the external carotid artery followed by the common carotid artery approximately 30 seconds later, bloodflow was reestablish through the internal carotid artery.  Next, a hand-held  Doppler was used to evaluate the signals in the common, external, and internal  carotid arteries, all of which had appropriate signals. I then administered  50  mg protamine. The wound was then irrigated.  After hemostasis was achieved, the carotid sheath was reapproximated with 3-0 Vicryl. The  platysma muscle was reapproximated with running 3-0 Vicryl. The skin  was closed with 4-0 Vicryl. Dermabond was  placed on the skin. The  patient was then successfully extubated. Her neurologic exam was  similar to his preprocedural exam. The patient was then taken to recovery room  in stable condition. There were no complications.     Disposition:  To PACU in stable condition.  Relevant Operative Details:  Normal anatomy. Tortuous distal ICA.  Vagus nerve was anterior lateral proximally  V. Annamarie Major, M.D. [facility] [place] Office: [phone] Pager:  [phone]

## 2015-02-20 ENCOUNTER — Inpatient Hospital Stay (HOSPITAL_COMMUNITY): Payer: Medicaid Other

## 2015-02-20 ENCOUNTER — Encounter (HOSPITAL_COMMUNITY): Payer: Self-pay | Admitting: *Deleted

## 2015-02-20 LAB — CBC
HEMATOCRIT: 27.9 % — AB (ref 36.0–46.0)
Hemoglobin: 8.6 g/dL — ABNORMAL LOW (ref 12.0–15.0)
MCH: 27.7 pg (ref 26.0–34.0)
MCHC: 30.8 g/dL (ref 30.0–36.0)
MCV: 90 fL (ref 78.0–100.0)
Platelets: 284 10*3/uL (ref 150–400)
RBC: 3.1 MIL/uL — ABNORMAL LOW (ref 3.87–5.11)
RDW: 14 % (ref 11.5–15.5)
WBC: 9.2 10*3/uL (ref 4.0–10.5)

## 2015-02-20 LAB — GLUCOSE, CAPILLARY
GLUCOSE-CAPILLARY: 192 mg/dL — AB (ref 70–99)
GLUCOSE-CAPILLARY: 177 mg/dL — AB (ref 70–99)
Glucose-Capillary: 243 mg/dL — ABNORMAL HIGH (ref 70–99)
Glucose-Capillary: 171 mg/dL — ABNORMAL HIGH (ref 70–99)

## 2015-02-20 LAB — BASIC METABOLIC PANEL
Anion gap: 11 (ref 5–15)
BUN: 21 mg/dL (ref 6–23)
CO2: 23 mmol/L (ref 19–32)
CREATININE: 0.94 mg/dL (ref 0.50–1.10)
Calcium: 8.3 mg/dL — ABNORMAL LOW (ref 8.4–10.5)
Chloride: 102 mmol/L (ref 96–112)
GFR, EST AFRICAN AMERICAN: 79 mL/min — AB (ref >90)
GFR, EST NON AFRICAN AMERICAN: 69 mL/min — AB (ref >90)
GLUCOSE: 146 mg/dL — AB (ref 70–99)
Potassium: 4.6 mmol/L (ref 3.5–5.1)
Sodium: 136 mmol/L (ref 135–145)

## 2015-02-20 MED ORDER — LORAZEPAM 2 MG/ML IJ SOLN
1.0000 mg | Freq: Four times a day (QID) | INTRAMUSCULAR | Status: DC | PRN
  Administered 2015-02-20: 1 mg via INTRAVENOUS
  Filled 2015-02-20: qty 1

## 2015-02-20 NOTE — Plan of Care (Signed)
Problem: Consults Goal: Diagnosis CEA/CES/AAA Stent Outcome: Completed/Met Date Met:  02/20/15 Carotid Endarterectomy (CEA)     

## 2015-02-20 NOTE — Progress Notes (Signed)
Orthopedic Tech Progress Note Patient Details:  [name] May 18, 1961 [id number]  Ortho Devices Type of Ortho Device: Velcro wrist splint Ortho Device/Splint Location: lue Ortho Device/Splint Interventions: Application As ordered by PA Kern Alberta, Trinidad Ingle 02/20/2015, 2:53 PM

## 2015-02-20 NOTE — Progress Notes (Signed)
[name], [name] came on the floor to see pt, said she would transfer to telemetry. No orders in epic for transfer. Made attempts to page, received no page back.

## 2015-02-20 NOTE — Progress Notes (Signed)
Pt's sister expressed concerns of safety at home. Reports that pt has been getting progressively weaker over the last few weeks and has a lot of trouble at home, even with the use of her walker and/or cane. Consulted PT to eval and notified the case manager and [place], [place].

## 2015-02-20 NOTE — Progress Notes (Signed)
[facility] [place]  Subjective  - Nausea and vomitting   Objective 141/63 81 97.5 F (36.4 C) (Oral) 17 100%  Intake/Output Summary (Last 24 hours) at 02/20/15 0741 Last data filed at 02/20/15 0600  Gross per 24 hour  Intake   3430 ml  Output    650 ml  Net   2780 ml   Incisions: C/d/i with some mild fullness around incision Extremities: 5/5 BUE/BLE Lungs: Non labored Neuro: In tact; tongue is midline  Left wrist x ray: IMPRESSION: Subtle nondisplaced fracture distal radius, healing. No new fracture. No dislocation. Question chronic tear in the triangular fibrocartilage region.  Assessment/Planning: POD #1 Left CEA  Nausea and vomiting have been her main pro I ordered her a left wrist splint and she will f/u with her orthopedic surgeon on an out patient basis.  Laurence Slate [facility] 02/20/2015 7:41 AM --  Laboratory Lab Results:  Recent Labs  02/19/15 1821 02/20/15 0436  WBC 11.3* 9.2  HGB 8.9* 8.6*  HCT 28.7* 27.9*  PLT 286 284   BMET  Recent Labs  02/17/15 1534 02/19/15 1821 02/20/15 0436  NA 140  --  136  K 4.1  --  4.6  CL 105  --  102  CO2 26  --  23  GLUCOSE 92  --  146*  BUN 18  --  21  CREATININE 1.03 1.02 0.94  CALCIUM 8.5  --  8.3*    COAG Lab Results  Component Value Date   INR 1.05 02/17/2015   INR 0.98 01/08/2015   INR 1.18 10/29/2014   No results found for: PTT

## 2015-02-20 NOTE — Evaluation (Signed)
Physical Therapy Evaluation Patient Details Name: [name] MRN: [id number] DOB: 06-May-1961 Today's Date: 02/20/2015   History of Present Illness  pt presents with L Carotid Endartectomy.  pt with hx of neuropathies, polysubstance, HF, COPD, and DM.    Clinical Impression  Pt did not need any physical A for mobility today, only S and set-up for safety.  Feel pt is safe to return to home with family support and would benefit from [place] for home safety eval.  Will continue to follow if remains on acute.      Follow Up Recommendations Home health PT;Supervision - Intermittent    Equipment Recommendations  None recommended by PT    Recommendations for Other Services       Precautions / Restrictions Precautions Precautions: Fall Restrictions Weight Bearing Restrictions: No      Mobility  Bed Mobility               General bed mobility comments: pt sitting in recliner.    Transfers Overall transfer level: Needs assistance   Transfers: Sit to/from Stand Sit to Stand: Supervision         General transfer comment: pt demos good use of UEs.    Ambulation/Gait Ambulation/Gait assistance: Supervision Ambulation Distance (Feet): 120 Feet Assistive device: Rolling walker (2 wheeled) Gait Pattern/deviations: Step-through pattern;Decreased stride length     General Gait Details: pt moves slowly and needs cueing for positioning within RW.    Stairs            Wheelchair Mobility    Modified Rankin (Stroke Patients Only)       Balance Overall balance assessment: Needs assistance Sitting-balance support: No upper extremity supported;Feet supported Sitting balance-Leahy Scale: Good     Standing balance support: No upper extremity supported;During functional activity Standing balance-Leahy Scale: Fair Standing balance comment: pt needs UE support during dynamic activities.                               Pertinent Vitals/Pain Pain Assessment:  0-10 Pain Score: 10-Worst pain ever Pain Location: Neck Pain Descriptors / Indicators: Aching Pain Intervention(s): Monitored during session;Repositioned;Premedicated before session;Patient requesting pain meds-RN notified    Home Living Family/patient expects to be discharged to:: Private residence Living Arrangements: Other relatives (Sister) Available Help at Discharge: Family;Available 24 hours/day Type of Home: Apartment Home Access: Stairs to enter Entrance Stairs-Rails: None Entrance Stairs-Number of Steps: 1 Home Layout: One level Home Equipment: Walker - 2 wheels;Cane - single point;Shower seat;Adaptive equipment Additional Comments: pt used to have a toilet aide, but states she lost it.      Prior Function Level of Independence: Needs assistance   Gait / Transfers Assistance Needed: pt used RW and only ambulated short distances.    ADL's / Homemaking Assistance Needed: Sister A with hard to reach bathing and peri-care during toileting.          Hand Dominance   Dominant Hand: Right    Extremity/Trunk Assessment   Upper Extremity Assessment: Overall WFL for tasks assessed           Lower Extremity Assessment: Generalized weakness      Cervical / Trunk Assessment: Normal  Communication   Communication: No difficulties  Cognition Arousal/Alertness: Awake/alert Behavior During Therapy: WFL for tasks assessed/performed Overall Cognitive Status: Within Functional Limits for tasks assessed  General Comments      Exercises        Assessment/Plan    PT Assessment Patient needs continued PT services  PT Diagnosis Difficulty walking   PT Problem List Decreased strength;Decreased activity tolerance;Decreased balance;Decreased mobility;Decreased knowledge of use of DME;Pain  PT Treatment Interventions DME instruction;Gait training;Stair training;Functional mobility training;Therapeutic activities;Therapeutic exercise;Balance  training;Patient/family education   PT Goals (Current goals can be found in the Care Plan section) Acute Rehab PT Goals Patient Stated Goal: Home PT Goal Formulation: With patient Time For Goal Achievement: 02/27/15 Potential to Achieve Goals: Good    Frequency Min 3X/week   Barriers to discharge        Co-evaluation               End of Session Equipment Utilized During Treatment: Gait belt Activity Tolerance: Patient tolerated treatment well Patient left: in chair;with call bell/phone within reach Nurse Communication: Mobility status         Time: [id number] PT Time Calculation (min) (ACUTE ONLY): 25 min   Charges:   PT Evaluation $Initial PT Evaluation Tier I: 1 Procedure PT Treatments $Gait Training: 8-22 mins   PT G CodesCatarina Hartshorn, [place] 02/20/2015, 2:51 PM

## 2015-02-21 LAB — GLUCOSE, CAPILLARY: Glucose-Capillary: 178 mg/dL — ABNORMAL HIGH (ref 70–99)

## 2015-02-21 LAB — HEMOGLOBIN A1C
Hgb A1c MFr Bld: 10.1 % — ABNORMAL HIGH (ref 4.8–5.6)
Mean Plasma Glucose: 243 mg/dL

## 2015-02-21 MED ORDER — OXYCODONE HCL 5 MG PO TABS: 5.0000 mg | ORAL_TABLET | Freq: Four times a day (QID) | ORAL | Status: DC | PRN

## 2015-02-21 NOTE — Progress Notes (Signed)
Discharge materials reviewed with pt. Educated pt on medication regimen and post op care of incision. Pt states no further questions.

## 2015-02-21 NOTE — Progress Notes (Signed)
  [facility] Progress Note  02/21/2015 8:04 AM 2 Days Post-Op  Subjective:  No further nausea or vomiting.   Tmax 98.1 BP sys 120s-150s 02 100% RA  Filed Vitals:   02/21/15 0700  BP:   Pulse:   Temp: 98.1 F (36.7 C)  Resp:     Physical Exam: Incisions:  Left neck incision without hematoma. Incision clean and intact. Extremities:  4/5 left grip strength. 5/5 right grip. 5/5 lower extremities bilaterally. Tongue midline. Slight left facial droop.   CBC    Component Value Date/Time   WBC 9.2 02/20/2015 0436   RBC 3.10* 02/20/2015 0436   HGB 8.6* 02/20/2015 0436   HCT 27.9* 02/20/2015 0436   PLT 284 02/20/2015 0436   MCV 90.0 02/20/2015 0436   MCH 27.7 02/20/2015 0436   MCHC 30.8 02/20/2015 0436   RDW 14.0 02/20/2015 0436   LYMPHSABS 2.3 09/07/2014 0436   MONOABS 1.1* 09/07/2014 0436   EOSABS 0.3 09/07/2014 0436   BASOSABS 0.0 09/07/2014 0436    BMET    Component Value Date/Time   NA 136 02/20/2015 0436   K 4.6 02/20/2015 0436   CL 102 02/20/2015 0436   CO2 23 02/20/2015 0436   GLUCOSE 146* 02/20/2015 0436   BUN 21 02/20/2015 0436   CREATININE 0.94 02/20/2015 0436   CREATININE 0.85 01/08/2015 1333   CALCIUM 8.3* 02/20/2015 0436   GFRNONAA 69* 02/20/2015 0436   GFRAA 79* 02/20/2015 0436    INR    Component Value Date/Time   INR 1.05 02/17/2015 1534   INR 2.6 08/02/2013 1424     Intake/Output Summary (Last 24 hours) at 02/21/15 0804 Last data filed at 02/21/15 0600  Gross per 24 hour  Intake    500 ml  Output   1650 ml  Net  -1150 ml     Assessment:  54 y.o. female is s/p: left carotid endarterectomy.   2 Days Post-Op  Plan: -Stayed in hospital yesterday due to n/v. This has resolved. -Wrist splint reapplied yesterday as she prematurely took her cast off.  -Neuro exam intact except for slight left marginal mandibular neuropraxia.  -Has ambulated and voided adequately. -Discharge home today. She will follow up with her  orthopedic surgeon.    Virgina Jock, PA-C [facility] Office: [phone] Pager: [phone] 02/21/2015 8:04 AM

## 2015-02-22 NOTE — Discharge Summary (Signed)
[facility] Discharge Summary  [name] 1961-04-01 54 y.o. female  [id number]  Admission Date: 02/19/2015  Discharge Date: 02/21/2015  Physician: Annamarie Major, MD  Admission Diagnosis: Left internal carotid artery stenosis I65.22  HPI:   This is a 54 y.o. female who recently had an ultrasound that was suggestive high-grade carotid disease. Dr. Gwenlyn Found performed angiography which revealed an occluded right internal carotid artery and a 95% left carotid stenosis. The patient is asymptomatic. Specifically, she denies numbness or weakness in either extremity. She denies slurred speech. She denies amaurosis fugax.  The patient has poorly controlled diabetes. Her last hemoglobin A1c was 8.1. She suffers from COPD and uses an occasional inhaler. She is trying to quit smoking. Her hypercholesterolemia is managed with a statin. She also suffers from peripheral vascular disease with ulceration and has undergone multiple percutaneous interventions. She has a history of coronary artery disease. Catheterization in December 2015 revealed small vessel disease. She has atrial fibrillation and has undergone cardioversion 2. She is on aspirin and Plavix.  Hospital Course:  The patient was admitted to the hospital and taken to the operating room on 02/19/2015 and underwent left carotid endarterectomy.  The patient tolerated the procedure well and was transported to the PACU in stable condition.  By POD 1, the patient's neuro status was intact. Her neck incision was clean and intact with mild fullness around her incision. She complained of nausea and vomiting. Her previously injured her left wrist but prematurely took her cast off before seeing her orthopedic surgeon. A wrist x-ray was ordered and showed a healing subtle non-displaced fracture to the distal radius. A left wrist splint was ordered. She was kept in stepdown due to continued nausea and vomiting.  By POD 2, her  nausea and vomiting had resolved. She was able to ambulate without difficulty and pain well-controlled. She was neurologically intact and her incision and was clean and dry without evidence of a hematoma. She was discharged home on POD 2 in good condition. She will follow up in two weeks with Dr. Trula Slade and was advised to see her orthopedic surgeon regarding her wrist.    Recent Labs  02/20/15 0436  NA 136  K 4.6  CL 102  CO2 23  GLUCOSE 146*  BUN 21  CALCIUM 8.3*    Recent Labs  02/19/15 1821 02/20/15 0436  WBC 11.3* 9.2  HGB 8.9* 8.6*  HCT 28.7* 27.9*  PLT 286 284   No results for input(s): INR in the last 72 hours.  Discharge Instructions:   The patient is discharged to home with extensive instructions on wound care and progressive ambulation.  They are instructed not to drive or perform any heavy lifting until returning to see the physician in his office.  Discharge Instructions    CAROTID [place]: Call MD for difficulty swallowing or speaking; weakness in arms or legs that is a new symtom; severe headache.  If you have increased swelling in the neck and/or  are having difficulty breathing, CALL [phone]    Complete by:  As directed      Call MD for:  redness, tenderness, or signs of infection (pain, swelling, bleeding, redness, odor or green/yellow discharge around incision site)    Complete by:  As directed      Call MD for:  severe or increased pain, loss or decreased feeling  in affected limb(s)    Complete by:  As directed      Call MD for:  temperature >  100.5    Complete by:  As directed      Driving Restrictions    Complete by:  As directed   No driving for 2 weeks     Lifting restrictions    Complete by:  As directed   No lifting for 2 weeks     Resume previous diet    Complete by:  As directed      may wash over wound with mild soap and water    Complete by:  As directed   Shower daily with soap and water starting 02/21/15           Discharge Diagnosis:    Left internal carotid artery stenosis I65.22  Secondary Diagnosis: Patient Active Problem List   Diagnosis Date Noted  . Carotid stenosis 02/19/2015  . PAF (paroxysmal atrial fibrillation) 01/16/2015  . Carotid arterial disease 01/16/2015  . Chronic diastolic CHF (congestive heart failure) 01/16/2015  . COPD (chronic obstructive pulmonary disease) 01/16/2015  . Claudication 01/15/2015  . Carotid artery disease 12/30/2014  . Demand ischemia 10/29/2014  . HCAP (healthcare-associated pneumonia) 09/07/2014  . DKA (diabetic ketoacidoses) 06/16/2014  . Atrial fibrillation with RVR 06/16/2014  . Acute on chronic diastolic CHF (congestive heart failure), NYHA class 3 06/16/2014  . Unspecified constipation 02/03/2014  . Insomnia 02/03/2014  . Pneumococcal pneumonia 02/02/2014  . DM type 2, uncontrolled, with neuropathy 02/02/2014  . CAP (community acquired pneumonia) 02/01/2014  . COPD with acute exacerbation 02/01/2014  . Acute respiratory failure 02/01/2014  . S/P peripheral artery angioplasty - [place]bo[place] atherectomy; R SFA 09/11/2013    Class: Acute  . Leg pain, bilateral 08/19/2013  . Hypothyroidism 07/31/2013  . Atherosclerotic peripheral vascular disease with ulceration 07/31/2013  . Cellulitis 06/13/2013  . History of cocaine abuse 06/13/2013  . Long term current use of anticoagulant therapy 05/20/2013  . Dyspnea on exertion   . Alcohol abuse   . Narcotic abuse   . Marijuana abuse   . Cardiomyopathy   . Alcoholic cirrhosis   . Cigarette smoker   . DM (diabetes mellitus), type 2 with peripheral vascular complications    Past Medical History  Diagnosis Date  . COPD (chronic obstructive pulmonary disease)   . Alcohol abuse   . Narcotic abuse   . Cocaine abuse   . Marijuana abuse   . Poorly controlled diabetes mellitus   . COPD (chronic obstructive pulmonary disease)   . Tobacco abuse   . Heart failure   . Alcoholic cirrhosis   . Cardiomyopathy   . Peripheral  arterial disease     a. 01/2015 Angio/PTA: LSFA 100 w/ recon @ adductor canal and 1 vessel runoff via AT, RSFA 99 (atherectomy/pta) - 1 vessel runoff via diff dzs peroneal.  . Obesity   . CHF (congestive heart failure)   . Hypothyroidism   . GERD (gastroesophageal reflux disease)   . Headache(784.0)     "@ least once or twice/wk" (09/10/2013)  . Arthritis     "hands, arms, shoulders, legs, back" (09/10/2013)  . Atrial fibrillation   . Critical lower limb ischemia   . Peripheral arterial disease   . Hypertension   . CAD (coronary artery disease)     a. cath 11/10/2014 small vessel CAD. Demand ischemia in the setting of rapid a-fib  . Carotid artery disease     a. 01/2015 Carotid Angio: RICA [id number], LICA [id number].  . Diabetes mellitus without complication   . Shortness of breath dyspnea     WALKING  .  Pneumonia     END MARCH 2016  . Anxiety   . Wrist fracture     LEFT      Medication List    STOP taking these medications        insulin NPH-regular Human (70-30) 100 UNIT/ML injection  Commonly known as:  NOVOLIN 70/30     ipratropium-albuterol 0.5-2.5 (3) MG/3ML Soln  Commonly known as:  DUONEB     LEVEMIR FLEXPEN [place]     metoprolol 50 MG tablet  Commonly known as:  LOPRESSOR      TAKE these medications        albuterol (2.5 MG/3ML) 0.083% nebulizer solution  Commonly known as:  PROVENTIL  Take 2.5 mg by nebulization every 6 (six) hours as needed. For wheezing.     aspirin 81 MG tablet  Take 81 mg by mouth daily.     carvedilol 6.25 MG tablet  Commonly known as:  COREG  Take 1 tablet (6.25 mg total) by mouth 2 (two) times daily with a meal.     clopidogrel 75 MG tablet  Commonly known as:  PLAVIX  Take 1 tablet (75 mg total) by mouth daily with breakfast.     furosemide 40 MG tablet  Commonly known as:  LASIX  Take 1 tablet (40 mg total) by mouth 2 (two) times daily.     gabapentin 300 MG capsule  Commonly known as:  NEURONTIN  Take 300 mg by mouth 3 (three)  times daily.     glipiZIDE 10 MG 24 hr tablet  Commonly known as:  GLUCOTROL XL  Take 30 mg by mouth daily with breakfast.     hydrOXYzine 50 MG tablet  Commonly known as:  ATARAX/VISTARIL  Take 50 mg by mouth at bedtime as needed. For sleep     insulin aspart 100 UNIT/ML injection  Commonly known as:  novoLOG  Inject 7-10 Units into the skin 3 (three) times daily with meals. Sliding scale, usually takes 10 units in am, 7 units in pm     insulin glargine 100 UNIT/ML injection  Commonly known as:  LANTUS  Inject 35 Units into the skin at bedtime.     ipratropium 17 MCG/ACT inhaler  Commonly known as:  ATROVENT HFA  Inhale 2 puffs into the lungs every 6 (six) hours.     levofloxacin 750 MG tablet  Commonly known as:  LEVAQUIN  Take 750 mg by mouth daily.     levothyroxine 50 MCG tablet  Commonly known as:  SYNTHROID, LEVOTHROID  Take 50 mcg by mouth daily.     lisinopril 20 MG tablet  Commonly known as:  PRINIVIL,ZESTRIL  Take 20 mg by mouth daily.     metFORMIN 1000 MG tablet  Commonly known as:  GLUCOPHAGE  Take 1,000 mg by mouth 2 (two) times daily with a meal.     nitroGLYCERIN 0.4 MG SL tablet  Commonly known as:  NITROSTAT  Place 1 tablet (0.4 mg total) under the tongue every 5 (five) minutes as needed for chest pain.     oxyCODONE 5 MG immediate release tablet  Commonly known as:  ROXICODONE  Take 1 tablet (5 mg total) by mouth every 6 (six) hours as needed.     pantoprazole 40 MG tablet  Commonly known as:  PROTONIX  Take 40 mg by mouth daily.     pravastatin 40 MG tablet  Commonly known as:  PRAVACHOL  Take 1 tablet (40 mg total) by mouth daily at  6 PM.        Oxycodone #30 No Refill  Disposition: Home  Patient's condition: is Good  Follow up: 1. Dr.  Trula Slade in 2 weeks. 2. Orthopedic surgeon   Virgina Jock, PA-C [facility] [phone]  --- For [facility] use --- Instructions: Press F2 to tab through selections.   Delete question if not applicable.   Modified Rankin score at D/C (0-6): 0  IV medication needed for:  1. Hypertension: No 2. Hypotension: No  Post-op Complications: No  1. Post-op CVA or TIA: No  2. CN injury: No  3. Myocardial infarction: No  4.  CHF: No  5.  Dysrhythmia (new): No  6. Wound infection: No  7. Reperfusion symptoms: No  8. Return to OR: No   Discharge medications: Statin use:  Yes If No: [ ]  For Medical reasons, [ ]  Non-compliant, [ ]  Not-indicated ASA use:  Yes  If No: [ ]  For Medical reasons, [ ]  Non-compliant, [ ]  Not-indicated Beta blocker use:  Yes If No: [ ]  For Medical reasons, [ ]  Non-compliant, [ ]  Not-indicated ACE-Inhibitor use:  Yes If No: [ ]  For Medical reasons, [ ]  Non-compliant, [ ]  Not-indicated P2Y12 Antagonist use: Yes, [ ]  Plavix, [ ]  Plasugrel, [ ]  Ticlopinine, [ ]  Ticagrelor, [ ]  Other, [ ]  No for medical reason, [ ]  Non-compliant, [ ]  Not-indicated Anti-coagulant use:  No, [ ]  Warfarin, [ ]  Rivaroxaban, [ ]  Dabigatran, [ ]  Other, [ ]  No for medical reason, [ ]  Non-compliant, [x ] Not-indicated

## 2015-02-23 ENCOUNTER — Telehealth: Payer: Self-pay | Admitting: Surgery

## 2015-02-23 NOTE — Addendum Note (Signed)
Addendum  created 02/23/15 1453 by Suzette Battiest, MD   Modules edited: Anesthesia Attestations

## 2015-02-23 NOTE — Telephone Encounter (Signed)
Left message regarding appointment. Asked that [place] call back to confirm, dpm

## 2015-02-23 NOTE — Telephone Encounter (Signed)
-----   Message from Mena Goes, RN sent at 02/19/2015  4:23 PM EDT ----- Regarding: Schedule   ----- Message -----    From: Gabriel Earing, PA-C    Sent: 02/19/2015   3:39 PM      To: [facility] Charge Pool  S/p left CEA 02/19/15.  F/u with Dr. Trula Slade in 2 weeks.  Thanks, Aldona Bar

## 2015-03-01 IMAGING — CR DG CHEST 1V PORT
1 series · 1 of 1 positions shown · non-contrast
Comparison: 06/06/2014.

CLINICAL DATA: Shortness breath.  Chest pain.

EXAM:
PORTABLE CHEST - 1 VIEW

[AP]
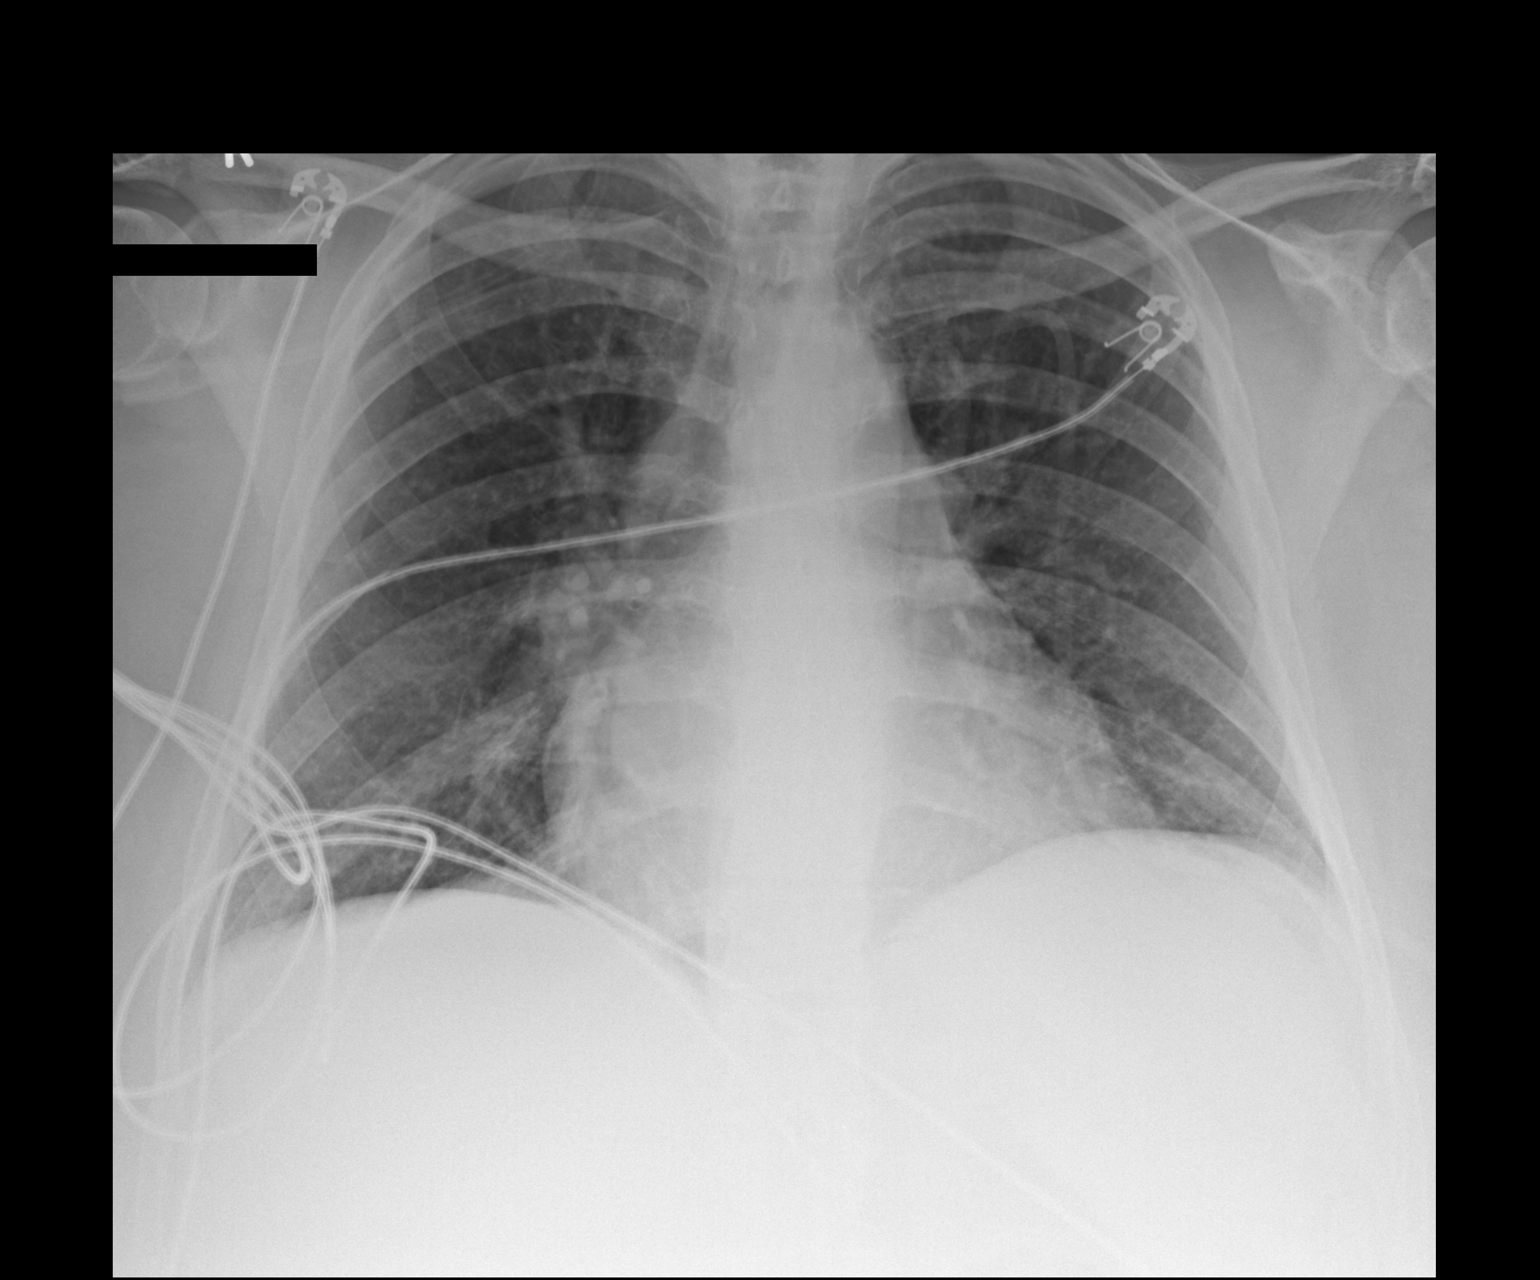

[1 of 1 positions shown; findings below may reference images not displayed]

FINDINGS: Cardiomegaly.

Central pulmonary vascular prominence.

No gross pneumothorax or segmental consolidation.

Calcified aorta.
IMPRESSION: Cardiomegaly.

Central pulmonary vascular prominence.

Calcified aorta.

## 2015-03-02 ENCOUNTER — Encounter: Payer: Medicaid Other | Admitting: Surgery

## 2015-03-03 ENCOUNTER — Encounter: Payer: Self-pay | Admitting: Surgery

## 2015-03-04 ENCOUNTER — Encounter: Payer: Self-pay | Admitting: Surgery

## 2015-03-04 ENCOUNTER — Ambulatory Visit (INDEPENDENT_AMBULATORY_CARE_PROVIDER_SITE_OTHER): Payer: Self-pay | Admitting: Surgery

## 2015-03-04 VITALS — BP 138/90 | HR 96 | Temp 98.4°F | Resp 20 | Ht 64.0 in | Wt 226.0 lb

## 2015-03-04 DIAGNOSIS — I6523 Occlusion and stenosis of bilateral carotid arteries: Secondary | ICD-10-CM

## 2015-03-04 NOTE — Progress Notes (Signed)
Patient name: [name] MRN: [id number] DOB: 07/10/1961 Sex: female     Chief Complaint  Patient presents with  . Routine Post Op    Left CEA     HISTORY OF PRESENT ILLNESS: The patient is back today for follow-up.  On 02/19/2015 she underwent left carotid endarterectomy with bovine pericardial patch angioplasty.  Intraoperative findings included 95% stenosis.  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  Her postoperative course was uncomplicated.  She has no complaints today.  She is neurologically intact.  Past Medical History  Diagnosis Date  . COPD (chronic obstructive pulmonary disease)   . Alcohol abuse   . Narcotic abuse   . Cocaine abuse   . Marijuana abuse   . Poorly controlled diabetes mellitus   . COPD (chronic obstructive pulmonary disease)   . Tobacco abuse   . Heart failure   . Alcoholic cirrhosis   . Cardiomyopathy   . Peripheral arterial disease     a. 01/2015 Angio/PTA: LSFA 100 w/ recon @ adductor canal and 1 vessel runoff via AT, RSFA 99 (atherectomy/pta) - 1 vessel runoff via diff dzs peroneal.  . Obesity   . CHF (congestive heart failure)   . Hypothyroidism   . GERD (gastroesophageal reflux disease)   . Headache(784.0)     "@ least once or twice/wk" (09/10/2013)  . Arthritis     "hands, arms, shoulders, legs, back" (09/10/2013)  . Atrial fibrillation   . Critical lower limb ischemia   . Peripheral arterial disease   . Hypertension   . CAD (coronary artery disease)     a. cath 11/10/2014 small vessel CAD. Demand ischemia in the setting of rapid a-fib  . Carotid artery disease     a. 01/2015 Carotid Angio: RICA [id number], LICA [id number].  . Diabetes mellitus without complication   . Shortness of breath dyspnea     WALKING  . Pneumonia     END MARCH 2016  . Anxiety   . Wrist fracture     LEFT    Past Surgical History  Procedure Laterality Date  . Cardioversion  ~ 02/2013    "twice"   . Lower extremity angiogram N/A 09/10/2013   Procedure: LOWER EXTREMITY ANGIOGRAM;  Surgeon: [name], MD;  Location: [facility] CATH LAB;  Service: Cardiovascular;  Laterality: N/A;  . Left heart catheterization with coronary angiogram N/A 10/31/2014    Procedure: LEFT HEART CATHETERIZATION WITH CORONARY ANGIOGRAM;  Surgeon: Burnell Blanks, MD;  Location: [facility] CATH LAB;  Service: Cardiovascular;  Laterality: N/A;  . Peripheral athrectomy Right 01/15/2015    SFA/notes 01/15/2015  . Balloon angioplasty, artery Right 01/15/2015    SFA/notes 01/15/2015  . Cardiac catheterization    . Lower extremity angiogram N/A 01/15/2015    Procedure: LOWER EXTREMITY ANGIOGRAM;  Surgeon: [name], MD;  Location: [facility] CATH LAB;  Service: Cardiovascular;  Laterality: N/A;  . Carotid angiogram N/A 01/15/2015    Procedure: CAROTID ANGIOGRAM;  Surgeon: [name], MD;  Location: [facility] CATH LAB;  Service: Cardiovascular;  Laterality: N/A;  . Endarterectomy Left 02/19/2015    Procedure: LEFT CAROTID ENDARTERECTOMY ;  Surgeon: Serafina Mitchell, MD;  Location: [place];  Service: Vascular;  Laterality: Left;    History   Social History  . Marital Status: Divorced    Spouse Name: N/A  . Number of Children: N/A  . Years of Education: N/A   Occupational History  . disabled    Social  History Main Topics  . Smoking status: Former Smoker -- 1.00 packs/day for 78 years    Types: Cigarettes, E-cigarettes    Quit date: 01/16/2015  . Smokeless tobacco: Never Used  . Alcohol Use: 0.0 oz/week    0 Standard drinks or equivalent per week     Comment: 01/15/2015 "might drink 1 beer/wk; never had problem w/it"  . Drug Use: Yes    Special: "Crack" cocaine, Marijuana     Comment: 01/15/2015 "last drug use was ~ 09/08/2013"  . Sexual Activity: Not Currently   Other Topics Concern  . Not on file   Social History Narrative    Family History  Problem Relation Age of Onset  . Hypertension Mother   . Diabetes Mother   . Cancer Mother     breast, ovarian, colon  .  Clotting disorder Mother   . Heart disease Mother   . Heart attack Mother   . Hypertension Father   . Heart disease Father   . Emphysema Sister     smoked    Allergies as of 03/04/2015 - Review Complete 03/04/2015  Allergen Reaction Noted  . Amiodarone Nausea And Vomiting 02/20/2015    Current Outpatient Prescriptions on File Prior to Visit  Medication Sig Dispense Refill  . albuterol (PROVENTIL) (2.5 MG/3ML) 0.083% nebulizer solution Take 2.5 mg by nebulization every 6 (six) hours as needed. For wheezing.    [name] aspirin 81 MG tablet Take 81 mg by mouth daily.    . carvedilol (COREG) 6.25 MG tablet Take 1 tablet (6.25 mg total) by mouth 2 (two) times daily with a meal. 60 tablet 5  . clopidogrel (PLAVIX) 75 MG tablet Take 1 tablet (75 mg total) by mouth daily with breakfast. 30 tablet 6  . gabapentin (NEURONTIN) 300 MG capsule Take 300 mg by mouth 3 (three) times daily.    [name] glipiZIDE (GLUCOTROL XL) 10 MG 24 hr tablet Take 30 mg by mouth daily with breakfast.     . hydrOXYzine (ATARAX/VISTARIL) 50 MG tablet Take 50 mg by mouth at bedtime as needed. For sleep    . insulin aspart (NOVOLOG) 100 UNIT/ML injection Inject 7-10 Units into the skin 3 (three) times daily with meals. Sliding scale, usually takes 10 units in am, 7 units in pm    . insulin glargine (LANTUS) 100 UNIT/ML injection Inject 35 Units into the skin at bedtime.    [name] ipratropium (ATROVENT HFA) 17 MCG/ACT inhaler Inhale 2 puffs into the lungs every 6 (six) hours.    [name] levothyroxine (SYNTHROID, LEVOTHROID) 50 MCG tablet Take 50 mcg by mouth daily.    [name] lisinopril (PRINIVIL,ZESTRIL) 20 MG tablet Take 20 mg by mouth daily.    . metFORMIN (GLUCOPHAGE) 1000 MG tablet Take 1,000 mg by mouth 2 (two) times daily with a meal.    . nitroGLYCERIN (NITROSTAT) 0.4 MG SL tablet Place 1 tablet (0.4 mg total) under the tongue every 5 (five) minutes as needed for chest pain. 25 tablet 3  . pantoprazole (PROTONIX) 40 MG tablet Take 40 mg by  mouth daily.    . pravastatin (PRAVACHOL) 40 MG tablet Take 1 tablet (40 mg total) by mouth daily at 6 PM. 30 tablet 6  . furosemide (LASIX) 40 MG tablet Take 1 tablet (40 mg total) by mouth 2 (two) times daily. 60 tablet 11  . oxyCODONE (ROXICODONE) 5 MG immediate release tablet Take 1 tablet (5 mg total) by mouth every 6 (six) hours as needed. (Patient not taking: Reported  on 03/04/2015) 30 tablet 0   No current facility-administered medications on file prior to visit.       PHYSICAL EXAMINATION:   Vital signs are  Filed Vitals:   03/04/15 1059 03/04/15 1102  BP: 142/85 138/90  Pulse: 102 96  Temp: 98.4 F (36.9 C)   TempSrc: Oral   Resp: 20   Height: 5\' 4"  (1.626 m)   Weight: 226 lb (102.513 kg)   SpO2: 95% 98%   Body mass index is 38.77 kg/(m^2). General: The patient appears their stated age. HEENT:  No gross abnormalities Pulmonary:  Non labored breathing Musculoskeletal: There are no major deformities. Neurologic: No focal weakness or paresthesias are detected, Skin: There are no ulcer or rashes noted. Psychiatric: The patient has normal affect. Cardiovascular: Incision is healing nicely  Diagnostic Studies none  Assessment: Status post left carotid endarterectomy Plan: The patient is doing very well.  She is neurologically intact.  She will get surveillance carotid studies at Dr. Kennon Holter office.  I will see her back in 9 months.  Eldridge Abrahams, M.D. [facility] Office: [phone] Pager:  [phone]

## 2015-03-05 ENCOUNTER — Telehealth (HOSPITAL_COMMUNITY): Payer: Self-pay

## 2015-03-05 NOTE — Telephone Encounter (Signed)
Patient's sister called to discuss Pulmonary Rehab. Unfortunately, patient has medicaid and medicaid will not provide any coverage for Pulmonary Rehab. Patient would not be appropriate for our maintenance program. I encouraged the patient's sister to follow up with [place] in the future if her situation ever changes.

## 2015-03-06 ENCOUNTER — Telehealth: Payer: Self-pay | Admitting: Cardiovascular Disease

## 2015-03-06 NOTE — Telephone Encounter (Addendum)
Social worker inquiring on behalf of patient for information about Cardiac Rehab associated w/ [facility] system in [place]. She explains pt had referral for cardiac rehab through [facility] for a location there, but she is living in [place] now.  Directed her to call the [facility] location - advised they could answer questions r/t setup. Gave number to call. She expressed thanks & understanding was verbalized.

## 2015-03-11 ENCOUNTER — Ambulatory Visit (INDEPENDENT_AMBULATORY_CARE_PROVIDER_SITE_OTHER): Payer: Medicaid Other | Admitting: Cardiovascular Disease

## 2015-03-11 ENCOUNTER — Encounter: Payer: Self-pay | Admitting: Cardiovascular Disease

## 2015-03-11 VITALS — BP 122/72 | Ht 64.0 in | Wt 219.0 lb

## 2015-03-11 DIAGNOSIS — I779 Disorder of arteries and arterioles, unspecified: Secondary | ICD-10-CM | POA: Diagnosis not present

## 2015-03-11 DIAGNOSIS — E785 Hyperlipidemia, unspecified: Secondary | ICD-10-CM | POA: Diagnosis not present

## 2015-03-11 DIAGNOSIS — Z79899 Other long term (current) drug therapy: Secondary | ICD-10-CM

## 2015-03-11 DIAGNOSIS — I739 Peripheral vascular disease, unspecified: Secondary | ICD-10-CM | POA: Diagnosis not present

## 2015-03-11 NOTE — Progress Notes (Signed)
03/11/2015 [name]   [birth year]  [id number]  Primary Physician [name], [name], MD Primary Cardiologist: [name] MD Renae Gloss   HPI:  [name] is a 54 year old moderately overweight single Caucasian female mother of one child who lives with her sister. She was referred by Dr. Franki Monte [facility] for peripheral vascular evaluation because of critical limb ischemia. I last saw her in the office 10/03/13 She sees Dr. Jenkins Rouge at [facility] for cardiomyopathy and proximal atrial fibrillation. Her cardiac risk factors include a 20-pack-year history of tobacco abuse, treated diabetes, and hypertension as well as family history of heart disease. She's never had a heart attack or stroke. She does have COPD. She had paroxysmal atrial fibrillation in the past and has undergone cardioversion in [place].. The lower extremity arterial Doppler studies performed in our office suggested critical limb ischemia with an occluded SFA and popliteal arteries bilaterally with tibial vessel disease as well. She complains of lifestyle limiting claudication. I performed lower extremity angiography on 09/10/13 revealing occluded SFAs bilaterally with chronic occluded tibial vessels as well. I performed [employer]bo[employer] directional atherectomy of her entire right SFA with an excellent angiographic result removing a copious amount of atherosclerotic plaque. The ulcer on her right heel ultimately healed. She does continue to smoke one pack per day. She was recently admitted to [facility] because of atrial fibrillation with rapid ventricular response. She underwent cardiac catheterization on 10/31/14 revealing an ejection fraction of 30-35% with disease that was ultimately treated medically. Her carotid Doppler showed high-grade bilateral ICA disease and lower extremity Dopplers revealed restenosis within the right SFA with a ABI of 0.34. She is symptomatic on that side. I  performed angiography on her 01/15/15 revealing an occluded right internal carotid artery, 95% proximal left internal carotid artery stenosis, long 99% stenosis within the right SFA and an occluded left SFA. I performed [facility] 1  directional atherectomy and drug-eluting balloon angioplasty of her right SFA with an excellent  Angiographic and clinical result. She underwent left carotid endarterectomy by Dr. Trula Slade on 02/19/15 which was uncomplicated.  Current Outpatient Prescriptions  Medication Sig Dispense Refill  . albuterol (PROVENTIL) (2.5 MG/3ML) 0.083% nebulizer solution Take 2.5 mg by nebulization every 6 (six) hours as needed. For wheezing.    [name] aspirin 81 MG tablet Take 81 mg by mouth daily.    . carvedilol (COREG) 6.25 MG tablet Take 1 tablet (6.25 mg total) by mouth 2 (two) times daily with a meal. 60 tablet 5  . clopidogrel (PLAVIX) 75 MG tablet Take 1 tablet (75 mg total) by mouth daily with breakfast. 30 tablet 6  . furosemide (LASIX) 40 MG tablet Take 1 tablet (40 mg total) by mouth 2 (two) times daily. 60 tablet 11  . gabapentin (NEURONTIN) 300 MG capsule Take 300 mg by mouth 3 (three) times daily.    [name] glipiZIDE (GLUCOTROL XL) 10 MG 24 hr tablet Take 30 mg by mouth daily with breakfast.     . hydrOXYzine (ATARAX/VISTARIL) 50 MG tablet Take 50 mg by mouth at bedtime as needed. For sleep    . insulin aspart (NOVOLOG) 100 UNIT/ML injection Inject 7-10 Units into the skin 3 (three) times daily with meals. Sliding scale, usually takes 10 units in am, 7 units in pm    . insulin glargine (LANTUS) 100 UNIT/ML injection Inject 35 Units into the skin at bedtime.    [name] ipratropium (ATROVENT HFA) 17 MCG/ACT inhaler Inhale  2 puffs into the lungs every 6 (six) hours.    [name] levothyroxine (SYNTHROID, LEVOTHROID) 50 MCG tablet Take 50 mcg by mouth daily.    [name] lisinopril (PRINIVIL,ZESTRIL) 20 MG tablet Take 20 mg by mouth daily.    . metFORMIN (GLUCOPHAGE) 1000 MG tablet Take 1,000 mg by mouth 2 (two) times  daily with a meal.    . nitroGLYCERIN (NITROSTAT) 0.4 MG SL tablet Place 1 tablet (0.4 mg total) under the tongue every 5 (five) minutes as needed for chest pain. 25 tablet 3  . oxyCODONE (ROXICODONE) 5 MG immediate release tablet Take 1 tablet (5 mg total) by mouth every 6 (six) hours as needed. 30 tablet 0  . pantoprazole (PROTONIX) 40 MG tablet Take 40 mg by mouth daily.    . pravastatin (PRAVACHOL) 40 MG tablet Take 1 tablet (40 mg total) by mouth daily at 6 PM. 30 tablet 6   No current facility-administered medications for this visit.    Allergies  Allergen Reactions  . Amiodarone Nausea And Vomiting    History   Social History  . Marital Status: Divorced    Spouse Name: N/A  . Number of Children: N/A  . Years of Education: N/A   Occupational History  . disabled    Social History Main Topics  . Smoking status: Former Smoker -- 1.00 packs/day for 78 years    Types: Cigarettes, E-cigarettes    Quit date: 01/16/2015  . Smokeless tobacco: Never Used  . Alcohol Use: 0.0 oz/week    0 Standard drinks or equivalent per week     Comment: 01/15/2015 "might drink 1 beer/wk; never had problem w/it"  . Drug Use: Yes    Special: "Crack" cocaine, Marijuana     Comment: 01/15/2015 "last drug use was ~ 09/08/2013"  . Sexual Activity: Not Currently   Other Topics Concern  . Not on file   Social History Narrative     Review of Systems: General: negative for chills, fever, night sweats or weight changes.  Cardiovascular: negative for chest pain, dyspnea on exertion, edema, orthopnea, palpitations, paroxysmal nocturnal dyspnea or shortness of breath Dermatological: negative for rash Respiratory: negative for cough or wheezing Urologic: negative for hematuria Abdominal: negative for nausea, vomiting, diarrhea, bright red blood per rectum, melena, or hematemesis Neurologic: negative for visual changes, syncope, or dizziness All other systems reviewed and are otherwise negative except as  noted above.    Blood pressure 122/72, height 5\' 4"  (1.626 m), weight 219 lb (99.338 kg), last menstrual period 11/27/2012.  General appearance: alert and no distress Neck: no adenopathy, no carotid bruit, no JVD, supple, symmetrical, trachea midline and thyroid not enlarged, symmetric, no tenderness/mass/nodules Lungs: clear to auscultation bilaterally Heart: regular rate and rhythm, S1, S2 normal, no murmur, click, rub or gallop Extremities: extremities normal, atraumatic, no cyanosis or edema  EKG not performed today  ASSESSMENT AND PLAN:   S/P peripheral artery angioplasty - TurboHawk atherectomy; R SFA [name] had [employer] atherectomy of her right SFA by myself 09/10/13 with excellent angiographic and ultrasound results. She had recurrent pain and evidence of restenosis with repeat intervention 01/15/15  Using [facility] 1 atherectomy and drug-eluting balloon. She had one vessel run off below the knee via peroneal artery. Her left SFA is known to be occluded with 1 vessel runoff via an anterior tibial that she is asymptomatic on that side. We will obtain follow-up lower extremity Doppler studies.   Carotid artery disease History of bilateral carotid disease demonstrated by  duplex ultrasound and confirmed by angiography which I performed 01/15/15 revealing an occluded right internal carotid artery and high-grade left ICA stenosis. She underwent elective left carotid endarterectomy by Dr. Trula Slade  on 02/19/15. We will get follow-up carotid Dopplers on her and continued to performed duplex ultrasound surveillance on annual basis.       [name] MD FACP,FACC,FAHA, [facility] 03/11/2015 11:25 AM

## 2015-03-11 NOTE — Assessment & Plan Note (Signed)
[name] had Turbo [facility] atherectomy of her right SFA by myself 09/10/13 with excellent angiographic and ultrasound results. She had recurrent pain and evidence of restenosis with repeat intervention 01/15/15  Using [facility] 1 atherectomy and drug-eluting balloon. She had one vessel run off below the knee via peroneal artery. Her left SFA is known to be occluded with 1 vessel runoff via an anterior tibial that she is asymptomatic on that side. We will obtain follow-up lower extremity Doppler studies.

## 2015-03-11 NOTE — Patient Instructions (Signed)
  We will see you back in follow up in 6 months with Dr Gwenlyn Found.   Dr Gwenlyn Found has ordered: 1. Carotid Duplex- This test is an ultrasound of the carotid arteries in your neck. It looks at blood flow through these arteries that supply the brain with blood. Allow one hour for this exam. There are no restrictions or special instructions.  2.  Lower extremity arterial doppler- During this test, ultrasound is used to evaluate arterial blood flow in the legs. Allow approximately one hour for this exam.   3. A FASTING lipid profile: to be done at your convenience.  There is a Psychologist, forensic lab on the first floor of this building, [address].  They are open from 8am-5pm with a lunch from 12-2.  You do not need an appointment.

## 2015-03-11 NOTE — Assessment & Plan Note (Signed)
History of bilateral carotid disease demonstrated by duplex ultrasound and confirmed by angiography which I performed 01/15/15 revealing an occluded right internal carotid artery and high-grade left ICA stenosis. She underwent elective left carotid endarterectomy by Dr. Trula Slade  on 02/19/15. We will get follow-up carotid Dopplers on her and continued to performed duplex ultrasound surveillance on annual basis.

## 2015-03-31 ENCOUNTER — Ambulatory Visit (HOSPITAL_COMMUNITY)
Admission: RE | Admit: 2015-03-31 | Discharge: 2015-03-31 | Disposition: A | Discharge status: Home / Self Care | Payer: Medicaid Other | Source: Ambulatory Visit | Attending: Cardiovascular Disease | Admitting: Cardiovascular Disease

## 2015-03-31 DIAGNOSIS — I6521 Occlusion and stenosis of right carotid artery: Secondary | ICD-10-CM | POA: Diagnosis not present

## 2015-03-31 DIAGNOSIS — I10 Essential (primary) hypertension: Secondary | ICD-10-CM | POA: Insufficient documentation

## 2015-03-31 DIAGNOSIS — Z48812 Encounter for surgical aftercare following surgery on the circulatory system: Secondary | ICD-10-CM | POA: Insufficient documentation

## 2015-03-31 DIAGNOSIS — E119 Type 2 diabetes mellitus without complications: Secondary | ICD-10-CM | POA: Insufficient documentation

## 2015-03-31 DIAGNOSIS — I739 Peripheral vascular disease, unspecified: Secondary | ICD-10-CM | POA: Diagnosis not present

## 2015-03-31 DIAGNOSIS — E785 Hyperlipidemia, unspecified: Secondary | ICD-10-CM | POA: Insufficient documentation

## 2015-03-31 NOTE — Progress Notes (Signed)
Carotid Duplex Completed. Known right ICA occlusion. Left ICA s/p CEA demonstrates normal patency without evidence of restenosis.  Oda Cogan, BS, RDMS, RVT

## 2015-04-01 ENCOUNTER — Telehealth: Payer: Self-pay | Admitting: *Deleted

## 2015-04-01 DIAGNOSIS — E785 Hyperlipidemia, unspecified: Secondary | ICD-10-CM

## 2015-04-01 DIAGNOSIS — Z79899 Other long term (current) drug therapy: Secondary | ICD-10-CM

## 2015-04-01 LAB — HEPATIC FUNCTION PANEL
ALT: 26 U/L (ref 0–35)
AST: 18 U/L (ref 0–37)
Albumin: 3.4 g/dL — ABNORMAL LOW (ref 3.5–5.2)
Alkaline Phosphatase: 129 U/L — ABNORMAL HIGH (ref 39–117)
BILIRUBIN DIRECT: 0.1 mg/dL (ref 0.0–0.3)
Indirect Bilirubin: 0.1 mg/dL — ABNORMAL LOW (ref 0.2–1.2)
Total Bilirubin: 0.2 mg/dL (ref 0.2–1.2)
Total Protein: 6.8 g/dL (ref 6.0–8.3)

## 2015-04-01 LAB — LIPID PANEL
Cholesterol: 208 mg/dL — ABNORMAL HIGH (ref 0–200)
HDL: 43 mg/dL — ABNORMAL LOW (ref >=46)
LDL CALC: 96 mg/dL (ref 0–99)
Total CHOL/HDL Ratio: 4.8 Ratio
Triglycerides: 347 mg/dL — ABNORMAL HIGH (ref <150)
VLDL: 69 mg/dL — ABNORMAL HIGH (ref 0–40)

## 2015-04-01 MED ORDER — PRAVASTATIN SODIUM 80 MG PO TABS: 80.0000 mg | ORAL_TABLET | Freq: Every day | ORAL | Status: DC

## 2015-04-01 NOTE — Telephone Encounter (Signed)
Patient notified of results Lab slip mailed for repeat labs and ERx sent in

## 2015-04-01 NOTE — Telephone Encounter (Signed)
-----   Message from [name], MD sent at 04/01/2015  1:32 PM EDT ----- Increase pravastatin to 80 mg and recheck

## 2015-04-09 ENCOUNTER — Telehealth (HOSPITAL_COMMUNITY): Payer: Self-pay | Admitting: *Deleted

## 2015-04-29 ENCOUNTER — Encounter (HOSPITAL_COMMUNITY): Payer: Self-pay | Admitting: *Deleted

## 2015-04-29 ENCOUNTER — Emergency Department (HOSPITAL_COMMUNITY): Payer: Medicaid Other

## 2015-04-29 ENCOUNTER — Inpatient Hospital Stay (HOSPITAL_COMMUNITY)
Admission: EM | Admit: 2015-04-29 | Discharge: 2015-05-03 | DRG: 293 | Disposition: A | Discharge status: Home / Self Care | Payer: Medicaid Other | Attending: Internal Medicine | Admitting: Internal Medicine

## 2015-04-29 DIAGNOSIS — F1210 Cannabis abuse, uncomplicated: Secondary | ICD-10-CM | POA: Diagnosis present

## 2015-04-29 DIAGNOSIS — E1142 Type 2 diabetes mellitus with diabetic polyneuropathy: Secondary | ICD-10-CM | POA: Diagnosis present

## 2015-04-29 DIAGNOSIS — E785 Hyperlipidemia, unspecified: Secondary | ICD-10-CM | POA: Diagnosis present

## 2015-04-29 DIAGNOSIS — K219 Gastro-esophageal reflux disease without esophagitis: Secondary | ICD-10-CM | POA: Diagnosis present

## 2015-04-29 DIAGNOSIS — Z87891 Personal history of nicotine dependence: Secondary | ICD-10-CM | POA: Diagnosis not present

## 2015-04-29 DIAGNOSIS — F1010 Alcohol abuse, uncomplicated: Secondary | ICD-10-CM | POA: Diagnosis present

## 2015-04-29 DIAGNOSIS — E1159 Type 2 diabetes mellitus with other circulatory complications: Secondary | ICD-10-CM | POA: Diagnosis not present

## 2015-04-29 DIAGNOSIS — R0609 Other forms of dyspnea: Secondary | ICD-10-CM

## 2015-04-29 DIAGNOSIS — I480 Paroxysmal atrial fibrillation: Secondary | ICD-10-CM | POA: Diagnosis present

## 2015-04-29 DIAGNOSIS — I739 Peripheral vascular disease, unspecified: Secondary | ICD-10-CM | POA: Diagnosis present

## 2015-04-29 DIAGNOSIS — Z7982 Long term (current) use of aspirin: Secondary | ICD-10-CM

## 2015-04-29 DIAGNOSIS — M1990 Unspecified osteoarthritis, unspecified site: Secondary | ICD-10-CM | POA: Diagnosis present

## 2015-04-29 DIAGNOSIS — I10 Essential (primary) hypertension: Secondary | ICD-10-CM | POA: Diagnosis present

## 2015-04-29 DIAGNOSIS — E1169 Type 2 diabetes mellitus with other specified complication: Secondary | ICD-10-CM | POA: Diagnosis present

## 2015-04-29 DIAGNOSIS — K7030 Alcoholic cirrhosis of liver without ascites: Secondary | ICD-10-CM | POA: Diagnosis present

## 2015-04-29 DIAGNOSIS — E1151 Type 2 diabetes mellitus with diabetic peripheral angiopathy without gangrene: Secondary | ICD-10-CM | POA: Diagnosis present

## 2015-04-29 DIAGNOSIS — Z888 Allergy status to other drugs, medicaments and biological substances status: Secondary | ICD-10-CM

## 2015-04-29 DIAGNOSIS — R0602 Shortness of breath: Secondary | ICD-10-CM | POA: Diagnosis present

## 2015-04-29 DIAGNOSIS — I5023 Acute on chronic systolic (congestive) heart failure: Secondary | ICD-10-CM | POA: Diagnosis present

## 2015-04-29 DIAGNOSIS — E039 Hypothyroidism, unspecified: Secondary | ICD-10-CM | POA: Diagnosis present

## 2015-04-29 DIAGNOSIS — I2510 Atherosclerotic heart disease of native coronary artery without angina pectoris: Secondary | ICD-10-CM | POA: Diagnosis present

## 2015-04-29 DIAGNOSIS — J449 Chronic obstructive pulmonary disease, unspecified: Secondary | ICD-10-CM | POA: Diagnosis present

## 2015-04-29 DIAGNOSIS — E119 Type 2 diabetes mellitus without complications: Secondary | ICD-10-CM | POA: Diagnosis present

## 2015-04-29 DIAGNOSIS — E1165 Type 2 diabetes mellitus with hyperglycemia: Secondary | ICD-10-CM | POA: Diagnosis present

## 2015-04-29 DIAGNOSIS — I5043 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) and diastolic (congestive) heart failure: Secondary | ICD-10-CM | POA: Diagnosis present

## 2015-04-29 DIAGNOSIS — F1410 Cocaine abuse, uncomplicated: Secondary | ICD-10-CM | POA: Diagnosis present

## 2015-04-29 DIAGNOSIS — E876 Hypokalemia: Secondary | ICD-10-CM | POA: Diagnosis not present

## 2015-04-29 DIAGNOSIS — F419 Anxiety disorder, unspecified: Secondary | ICD-10-CM | POA: Diagnosis present

## 2015-04-29 DIAGNOSIS — I5021 Acute systolic (congestive) heart failure: Secondary | ICD-10-CM | POA: Diagnosis not present

## 2015-04-29 HISTORY — DX: Type 2 diabetes mellitus with hyperglycemia: E11.65

## 2015-04-29 HISTORY — DX: Hyperlipidemia, unspecified: E78.5

## 2015-04-29 HISTORY — DX: Type 2 diabetes mellitus with diabetic polyneuropathy: E11.42

## 2015-04-29 LAB — BASIC METABOLIC PANEL
Anion gap: 7 (ref 5–15)
BUN: 22 mg/dL — ABNORMAL HIGH (ref 6–20)
CALCIUM: 8.8 mg/dL — AB (ref 8.9–10.3)
CHLORIDE: 104 mmol/L (ref 101–111)
CO2: 29 mmol/L (ref 22–32)
Creatinine, Ser: 1.02 mg/dL — ABNORMAL HIGH (ref 0.44–1.00)
GFR calc Af Amer: >60 mL/min (ref >60)
GFR calc non Af Amer: >60 mL/min (ref >60)
Glucose, Bld: 199 mg/dL — ABNORMAL HIGH (ref 65–99)
Potassium: 4.3 mmol/L (ref 3.5–5.1)
SODIUM: 140 mmol/L (ref 135–145)

## 2015-04-29 LAB — GLUCOSE, CAPILLARY: GLUCOSE-CAPILLARY: 218 mg/dL — AB (ref 65–99)

## 2015-04-29 LAB — I-STAT TROPONIN, ED: Troponin i, poc: 0.03 ng/mL (ref 0.00–0.08)

## 2015-04-29 LAB — CBC
HEMATOCRIT: 29.5 % — AB (ref 36.0–46.0)
Hemoglobin: 9 g/dL — ABNORMAL LOW (ref 12.0–15.0)
MCH: 26.8 pg (ref 26.0–34.0)
MCHC: 30.5 g/dL (ref 30.0–36.0)
MCV: 87.8 fL (ref 78.0–100.0)
Platelets: 298 10*3/uL (ref 150–400)
RBC: 3.36 MIL/uL — AB (ref 3.87–5.11)
RDW: 16 % — ABNORMAL HIGH (ref 11.5–15.5)
WBC: 9 10*3/uL (ref 4.0–10.5)

## 2015-04-29 LAB — BRAIN NATRIURETIC PEPTIDE: B NATRIURETIC PEPTIDE 5: 1483.5 pg/mL — AB (ref 0.0–100.0)

## 2015-04-29 MED ORDER — ENOXAPARIN SODIUM 40 MG/0.4ML ~~LOC~~ SOLN
40.0000 mg | Freq: Every day | SUBCUTANEOUS | Status: DC
  Administered 2015-04-30 – 2015-05-03 (×4): 40 mg via SUBCUTANEOUS
  Filled 2015-04-29 (×4): qty 0.4

## 2015-04-29 MED ORDER — CLOPIDOGREL BISULFATE 75 MG PO TABS
75.0000 mg | ORAL_TABLET | Freq: Every day | ORAL | Status: DC
  Administered 2015-04-30 – 2015-05-01 (×2): 75 mg via ORAL
  Filled 2015-04-29 (×4): qty 1

## 2015-04-29 MED ORDER — NITROGLYCERIN 0.4 MG SL SUBL: 0.4000 mg | SUBLINGUAL_TABLET | SUBLINGUAL | Status: DC | PRN

## 2015-04-29 MED ORDER — SODIUM CHLORIDE 0.9 % IJ SOLN
3.0000 mL | Freq: Two times a day (BID) | INTRAMUSCULAR | Status: DC
  Administered 2015-04-30 – 2015-05-02 (×7): 3 mL via INTRAVENOUS

## 2015-04-29 MED ORDER — GABAPENTIN 400 MG PO CAPS
400.0000 mg | ORAL_CAPSULE | Freq: Three times a day (TID) | ORAL | Status: DC
  Administered 2015-04-30 – 2015-05-03 (×15): 400 mg via ORAL
  Filled 2015-04-29 (×18): qty 1

## 2015-04-29 MED ORDER — PANTOPRAZOLE SODIUM 40 MG PO TBEC
40.0000 mg | DELAYED_RELEASE_TABLET | Freq: Every day | ORAL | Status: DC
  Administered 2015-04-30 – 2015-05-03 (×4): 40 mg via ORAL
  Filled 2015-04-29 (×3): qty 1

## 2015-04-29 MED ORDER — PRAVASTATIN SODIUM 80 MG PO TABS
80.0000 mg | ORAL_TABLET | Freq: Every day | ORAL | Status: DC
  Administered 2015-04-30 – 2015-05-02 (×3): 80 mg via ORAL
  Filled 2015-04-29 (×4): qty 1

## 2015-04-29 MED ORDER — ONDANSETRON HCL 4 MG/2ML IJ SOLN: 4.0000 mg | Freq: Four times a day (QID) | INTRAMUSCULAR | Status: DC | PRN

## 2015-04-29 MED ORDER — METOPROLOL TARTRATE 50 MG PO TABS: 50.0000 mg | ORAL_TABLET | Freq: Two times a day (BID) | ORAL | Status: DC

## 2015-04-29 MED ORDER — ALBUTEROL SULFATE (2.5 MG/3ML) 0.083% IN NEBU
5.0000 mg | INHALATION_SOLUTION | Freq: Once | RESPIRATORY_TRACT | Status: AC
  Administered 2015-04-29: 5 mg via RESPIRATORY_TRACT
  Filled 2015-04-29: qty 6

## 2015-04-29 MED ORDER — FUROSEMIDE 10 MG/ML IJ SOLN
60.0000 mg | Freq: Once | INTRAMUSCULAR | Status: AC
  Administered 2015-04-29: 60 mg via INTRAVENOUS
  Filled 2015-04-29: qty 6

## 2015-04-29 MED ORDER — ACETAMINOPHEN 650 MG RE SUPP: 650.0000 mg | Freq: Four times a day (QID) | RECTAL | Status: DC | PRN

## 2015-04-29 MED ORDER — ACETAMINOPHEN 325 MG PO TABS
650.0000 mg | ORAL_TABLET | Freq: Four times a day (QID) | ORAL | Status: DC | PRN
  Administered 2015-04-30 – 2015-05-03 (×6): 650 mg via ORAL
  Filled 2015-04-29 (×6): qty 2

## 2015-04-29 MED ORDER — LEVOTHYROXINE SODIUM 50 MCG PO TABS
50.0000 ug | ORAL_TABLET | Freq: Every day | ORAL | Status: DC
  Administered 2015-04-30 – 2015-05-03 (×4): 50 ug via ORAL
  Filled 2015-04-29 (×5): qty 1

## 2015-04-29 MED ORDER — ALBUTEROL SULFATE (2.5 MG/3ML) 0.083% IN NEBU
2.5000 mg | INHALATION_SOLUTION | Freq: Four times a day (QID) | RESPIRATORY_TRACT | Status: DC | PRN
  Administered 2015-05-01: 2.5 mg via RESPIRATORY_TRACT
  Filled 2015-04-29: qty 3

## 2015-04-29 MED ORDER — FUROSEMIDE 10 MG/ML IJ SOLN
40.0000 mg | Freq: Three times a day (TID) | INTRAMUSCULAR | Status: DC
  Administered 2015-04-30 – 2015-05-02 (×8): 40 mg via INTRAVENOUS
  Filled 2015-04-29 (×11): qty 4

## 2015-04-29 MED ORDER — IPRATROPIUM BROMIDE 0.02 % IN SOLN
0.5000 mg | Freq: Two times a day (BID) | RESPIRATORY_TRACT | Status: DC
  Administered 2015-04-30 – 2015-05-03 (×7): 0.5 mg via RESPIRATORY_TRACT
  Filled 2015-04-29 (×7): qty 2.5

## 2015-04-29 MED ORDER — ASPIRIN EC 81 MG PO TBEC
81.0000 mg | DELAYED_RELEASE_TABLET | Freq: Every day | ORAL | Status: DC
  Administered 2015-04-30 – 2015-05-03 (×4): 81 mg via ORAL
  Filled 2015-04-29 (×4): qty 1

## 2015-04-29 MED ORDER — HYDROXYZINE HCL 50 MG PO TABS
50.0000 mg | ORAL_TABLET | Freq: Every day | ORAL | Status: DC
  Administered 2015-04-30 – 2015-05-02 (×4): 50 mg via ORAL
  Filled 2015-04-29 (×5): qty 1

## 2015-04-29 MED ORDER — INSULIN GLARGINE 100 UNIT/ML ~~LOC~~ SOLN
35.0000 [IU] | Freq: Every day | SUBCUTANEOUS | Status: DC
  Administered 2015-04-30 – 2015-05-01 (×3): 35 [IU] via SUBCUTANEOUS
  Filled 2015-04-29 (×3): qty 0.35

## 2015-04-29 MED ORDER — ONDANSETRON HCL 4 MG PO TABS: 4.0000 mg | ORAL_TABLET | Freq: Four times a day (QID) | ORAL | Status: DC | PRN

## 2015-04-29 MED ORDER — INSULIN ASPART 100 UNIT/ML ~~LOC~~ SOLN
0.0000 [IU] | Freq: Three times a day (TID) | SUBCUTANEOUS | Status: DC
  Administered 2015-04-30: 2 [IU] via SUBCUTANEOUS
  Administered 2015-04-30: 5 [IU] via SUBCUTANEOUS
  Administered 2015-04-30: 2 [IU] via SUBCUTANEOUS
  Administered 2015-05-01: 5 [IU] via SUBCUTANEOUS
  Administered 2015-05-01: 1 [IU] via SUBCUTANEOUS
  Administered 2015-05-01: 5 [IU] via SUBCUTANEOUS
  Administered 2015-05-02: 2 [IU] via SUBCUTANEOUS

## 2015-04-29 MED ORDER — LISINOPRIL 20 MG PO TABS
20.0000 mg | ORAL_TABLET | Freq: Every day | ORAL | Status: DC
  Administered 2015-04-30 – 2015-05-03 (×4): 20 mg via ORAL
  Filled 2015-04-29 (×4): qty 1

## 2015-04-29 MED ORDER — CARVEDILOL 6.25 MG PO TABS
6.2500 mg | ORAL_TABLET | Freq: Two times a day (BID) | ORAL | Status: DC
  Administered 2015-04-30 – 2015-05-01 (×4): 6.25 mg via ORAL
  Filled 2015-04-29 (×7): qty 1

## 2015-04-29 MED ORDER — GABAPENTIN 400 MG PO CAPS
400.0000 mg | ORAL_CAPSULE | Freq: Once | ORAL | Status: DC
  Filled 2015-04-29: qty 1

## 2015-04-29 NOTE — H&P (Addendum)
[facility] History and Physical  [name] [id number] DOB: [birth year] DOA: 04/29/2015  Referring physician: [id number]. PCP: Kevan Ny, MD  Specialists: Dr.Berry.  Chief Complaint: Shortness of breath.  HPI: [name] is a 54 y.o. female with history of chronic systolic heart failure last EF measured was 40-45% in December 2015 presents to the ER because of shortness of breath. Patient states he has been getting increasing short of breath over the last 4 days and has gained at least 20 pounds from her baseline. Patient states she usually weighs around 220 pounds and she had this time was weighing around 240 pounds. On exam patient has bilateral lower extremity edema and chest x-ray shows congestion. Patient otherwise denies any chest pain nausea vomiting diaphoresis abdominal pain or diarrhea or any fever chills or productive cough.  Review of Systems: As presented in the history of presenting illness, rest negative.  Past Medical History  Diagnosis Date  . Alcohol abuse   . Narcotic abuse   . Cocaine abuse   . Marijuana abuse   . Tobacco abuse   . Alcoholic cirrhosis   . Cardiomyopathy   . Obesity   . CHF (congestive heart failure)   . Hypothyroidism   . GERD (gastroesophageal reflux disease)   . Atrial fibrillation   . Critical lower limb ischemia   . Hypertension   . CAD (coronary artery disease)     a. cath 11/10/2014 small vessel CAD. Demand ischemia in the setting of rapid a-fib  . Pneumonia     END MARCH 2016  . Anxiety   . Wrist fracture     LEFT; "just did xray and cast"  . DOE (dyspnea on exertion) 04/29/2015  . Poorly controlled type 2 diabetes mellitus   . Hyperlipemia   . Peripheral arterial disease     a. 01/2015 Angio/PTA: LSFA 100 w/ recon @ adductor canal and 1 vessel runoff via AT, RSFA 99 (atherectomy/pta) - 1 vessel runoff via diff dzs peroneal.  . Carotid artery disease     a. 01/2015 Carotid Angio: RICA 123XX123, LICA [id number].  [name] COPD (chronic  obstructive pulmonary disease)   . Arthritis     "hands, arms, shoulders, legs, back" (04/29/2015)  . Diabetic peripheral neuropathy    Past Surgical History  Procedure Laterality Date  . Cardioversion  ~ 02/2013    "twice"   . Lower extremity angiogram N/A 09/10/2013    Procedure: LOWER EXTREMITY ANGIOGRAM;  Surgeon: [name], MD;  Location[facility] C[facility] LAB;  Service: Cardiovascular;  Laterality: N/A;  . Left heart catheterization with coronary angiogram N/A 10/31/2014    Procedure: LEFT HEART CATHETERIZATION WITH CORONARY ANGIOGRAM;  Surgeon: Burnell Blanks, MD;  Location: [facility] CATH LAB;  Service: Cardiovascular;  Laterality: N/A;  . Peripheral athrectomy Right 01/15/2015    SFA/notes 01/15/2015  . Balloon angioplasty, artery Right 01/15/2015    SFA/notes 01/15/2015  . Cardiac catheterization    . Lower extremity angiogram N/A 01/15/2015    Procedure: LOWER EXTREMITY ANGIOGRAM;  Surgeon: [name], MD;  Location: [facility] CATH LAB;  Service: Cardiovascular;  Laterality: N/A;  . Carotid angiogram N/A 01/15/2015    Procedure: CAROTID ANGIOGRAM;  Surgeon: [name], MD;  Location: [facility] CATH LAB;  Service: Cardiovascular;  Laterality: N/A;  . Endarterectomy Left 02/19/2015    Procedure: LEFT CAROTID ENDARTERECTOMY ;  Surgeon: Serafina Mitchell, MD;  Location: [facility] OR;  Service: Vascular;  Laterality: Left;  . Dilation and curettage of uterus  1988   Social History:  reports that she quit smoking about 2 months ago. Her smoking use included Cigarettes. She has a 40 pack-year smoking history. She has never used smokeless tobacco. She reports that she drinks alcohol. She reports that she uses illicit drugs ("Crack" cocaine and Marijuana). Where does patient live home. Can patient participate in ADLs? Yes.  Allergies  Allergen Reactions  . Amiodarone Nausea And Vomiting    Family History:  Family History  Problem Relation Age of Onset  . Hypertension Mother   . Diabetes Mother   .  Cancer Mother     breast, ovarian, colon  . Clotting disorder Mother   . Heart disease Mother   . Heart attack Mother   . Hypertension Father   . Heart disease Father   . Emphysema Sister     smoked      Prior to Admission medications   Medication Sig Start Date End Date Taking? Authorizing Provider  albuterol (PROVENTIL) (2.5 MG/3ML) 0.083% nebulizer solution Take 2.5 mg by nebulization every 6 (six) hours as needed. For wheezing.   Yes Historical Provider, MD  aspirin 81 MG tablet Take 81 mg by mouth daily.   Yes Historical Provider, MD  carvedilol (COREG) 6.25 MG tablet Take 1 tablet (6.25 mg total) by mouth 2 (two) times daily with a meal. 02/05/15  Yes [name], MD  clopidogrel (PLAVIX) 75 MG tablet Take 1 tablet (75 mg total) by mouth daily with breakfast. 01/16/15  Yes Rogelia Mire, NP  furosemide (LASIX) 40 MG tablet Take 1 tablet (40 mg total) by mouth 2 (two) times daily. 12/09/14  Yes Josue Hector, MD  gabapentin (NEURONTIN) 300 MG capsule Take 400 mg by mouth 4 (four) times daily.    Yes Historical Provider, MD  hydrOXYzine (ATARAX/VISTARIL) 50 MG tablet Take 50 mg by mouth at bedtime. For sleep   Yes Historical Provider, MD  insulin aspart (NOVOLOG) 100 UNIT/ML injection Inject 7-10 Units into the skin 3 (three) times daily with meals. Sliding scale, usually takes 10 units in am, 7 units in pm   Yes Historical Provider, MD  insulin glargine (LANTUS) 100 UNIT/ML injection Inject 35 Units into the skin at bedtime.   Yes Historical Provider, MD  ipratropium (ATROVENT HFA) 17 MCG/ACT inhaler Inhale 2 puffs into the lungs 2 (two) times daily.    Yes Historical Provider, MD  levothyroxine (SYNTHROID, LEVOTHROID) 50 MCG tablet Take 50 mcg by mouth daily.   Yes Historical Provider, MD  lisinopril (PRINIVIL,ZESTRIL) 20 MG tablet Take 20 mg by mouth daily.   Yes Historical Provider, MD  metFORMIN (GLUCOPHAGE) 1000 MG tablet Take 1,000 mg by mouth 2 (two) times daily with a  meal.   Yes Historical Provider, MD  metoprolol (LOPRESSOR) 50 MG tablet Take 50 mg by mouth 2 (two) times daily.   Yes Historical Provider, MD  nitroGLYCERIN (NITROSTAT) 0.4 MG SL tablet Place 1 tablet (0.4 mg total) under the tongue every 5 (five) minutes as needed for chest pain. 01/16/15  Yes Rogelia Mire, NP  pantoprazole (PROTONIX) 40 MG tablet Take 40 mg by mouth daily. 02/13/15 02/13/16 Yes Historical Provider, MD  pravastatin (PRAVACHOL) 80 MG tablet Take 1 tablet (80 mg total) by mouth daily at 6 PM. 04/01/15  Yes [name], MD  PRESCRIPTION MEDICATION Apply 1 application topically daily.   Yes Historical Provider, MD  oxyCODONE (ROXICODONE) 5 MG immediate release tablet Take 1 tablet (5 mg total) by mouth  every 6 (six) hours as needed. Patient not taking: Reported on 04/29/2015 02/21/15   Alvia Grove, PA-C    Physical Exam: Filed Vitals:   04/29/15 2015 04/29/15 2045 04/29/15 2130 04/29/15 2235  BP: 166/85 162/90 160/77 164/84  Pulse: 107 96 110 109  Temp:    99.3 F (37.4 C)  TempSrc:    Oral  Resp: 20 19 18 22   SpO2: 100% 96% 92% 100%     General:  Moderately built and nourished.  Eyes: Anicteric no pallor.  ENT: No discharge from the ears eyes nose and mouth.  Neck: JVD is elevated. No mass felt.  Cardiovascular: S1 and S2 heard.  Respiratory: No rhonchi or crepitations.  Abdomen: Soft nontender bowel sounds present.  Skin: Bilateral lower extremity edema.  Musculoskeletal: Bilateral lower extremity edema.  Psychiatric: Appears normal.  Neurologic: Alert awake oriented to time place and person. Moves all extremities.  Labs on Admission:  Basic Metabolic Panel:  Recent Labs Lab 04/29/15 1553  NA 140  K 4.3  CL 104  CO2 29  GLUCOSE 199*  BUN 22*  CREATININE 1.02*  CALCIUM 8.8*   Liver Function Tests: No results for input(s): AST, ALT, ALKPHOS, BILITOT, PROT, ALBUMIN in the last 168 hours. No results for input(s): LIPASE, AMYLASE in  the last 168 hours. No results for input(s): AMMONIA in the last 168 hours. CBC:  Recent Labs Lab 04/29/15 1553  WBC 9.0  HGB 9.0*  HCT 29.5*  MCV 87.8  PLT 298   Cardiac Enzymes: No results for input(s): CKTOTAL, CKMB, CKMBINDEX, TROPONINI in the last 168 hours.  BNP (last 3 results)  Recent Labs  04/29/15 1553  BNP 1483.5*    ProBNP (last 3 results)  Recent Labs  06/16/14 1050 09/07/14 0406 10/29/14 0222  PROBNP 2014.0* 1423.0* 1070.0*    CBG:  Recent Labs Lab 04/29/15 2254  GLUCAP 218*    Radiological Exams on Admission: Dg Chest 2 View  04/29/2015   CLINICAL DATA:  Shortness of breath for 3 days.  EXAM: CHEST  2 VIEW  COMPARISON:  02/17/2015  FINDINGS: Cardiac silhouette is upper limits of normal to mildly enlarged in size, not significantly changed. Thoracic aortic calcification is noted. There is increased peribronchial cuffing and interstitial densities bilaterally, greatest in the lung bases. No segmental airspace consolidation, pleural effusion, or pneumothorax is identified. No acute osseous abnormality is seen.  IMPRESSION: Increased interstitial densities bilaterally, which may represent developing interstitial edema.   Electronically Signed   By: Logan Bores   On: 04/29/2015 16:20    EKG: Independently reviewed. Normal sinus rhythm with nonspecific T-wave changes.  Assessment/Plan Principal Problem:   Acute systolic CHF (congestive heart failure) Active Problems:   Hypothyroidism   COPD (chronic obstructive pulmonary disease)   Diabetes mellitus type 2, controlled   CHF (congestive heart failure)   1. Acute systolic heart failure Marlowe Sax pressure was 40-45% in December 2015 - patient did receive IV Lasix in the ER and I have placed patient on 40 mg IV every 8 hourly. Closely follow intake output metabolic panel and daily weights. Patient is on lisinopril. 2. Diabetes mellitus type 2 - adrenal medications but hold metformin while inpatient.  Sliding scale coverage. 3. Hypothyroidism on Synthroid. 4. Paroxysmal atrial fibrillation presently in sinus rhythm - patient is on aspirin and Plavix. On metoprolol. 5. COPD presently not wheezing. 6. Recent left carotid endarterectomy. 7. Peripheral vascular disease status post angioplasty.   DVT Prophylaxis Lovenox.  Code Status: Full  code.  Family Communication: Discussed with patient.  Disposition Plan: Admit to inpatient.    Laramie Gelles N. [facility] Pager [phone].  If 7PM-7AM, please contact night-coverage [email or web address] Password [facility] 04/29/2015, 11:44 PM

## 2015-04-29 NOTE — ED Provider Notes (Signed)
CSN: [id number]     Arrival date & time 04/29/15  1518 History   First MD Initiated Contact with Patient 04/29/15 1759     Chief Complaint  Patient presents with  . Shortness of Breath     (Consider location/radiation/quality/duration/timing/severity/associated sxs/prior Treatment) HPI [name] is a 54 year old female with past medical history of COPD, cardiomyopathy, CHF with last echo 10/2014 at [id number], A. fib, alcoholic cirrhosis who presents the ER complaining of progressively worsening dyspnea on exertion, orthopnea. Patient states her symptoms began several weeks ago, have progressively worsened. Patient states that she is also noticed an approximately 20 pound weight gain over the past 2 weeks. She states she is not changed or missed any of her doses of diuretics. Patient states her past week she has been eating fried foods which is abnormal for her. Patient denies any associated chest pain, headache, blurred vision, dizziness, weakness, nausea, vomiting, palpitations, abdominal pain, dysuria.  Past Medical History  Diagnosis Date  . COPD (chronic obstructive pulmonary disease)   . Alcohol abuse   . Narcotic abuse   . Cocaine abuse   . Marijuana abuse   . Poorly controlled diabetes mellitus   . COPD (chronic obstructive pulmonary disease)   . Tobacco abuse   . Heart failure   . Alcoholic cirrhosis   . Cardiomyopathy   . Peripheral arterial disease     a. 01/2015 Angio/PTA: LSFA 100 w/ recon @ adductor canal and 1 vessel runoff via AT, RSFA 99 (atherectomy/pta) - 1 vessel runoff via diff dzs peroneal.  . Obesity   . CHF (congestive heart failure)   . Hypothyroidism   . GERD (gastroesophageal reflux disease)   . Headache(784.0)     "@ least once or twice/wk" (09/10/2013)  . Arthritis     "hands, arms, shoulders, legs, back" (09/10/2013)  . Atrial fibrillation   . Critical lower limb ischemia   . Peripheral arterial disease   . Hypertension   . CAD (coronary artery  disease)     a. cath 11/10/2014 small vessel CAD. Demand ischemia in the setting of rapid a-fib  . Carotid artery disease     a. 01/2015 Carotid Angio: RICA 123XX123, LICA [id number].  . Diabetes mellitus without complication   . Shortness of breath dyspnea     WALKING  . Pneumonia     END MARCH 2016  . Anxiety   . Wrist fracture     LEFT   Past Surgical History  Procedure Laterality Date  . Cardioversion  ~ 02/2013    "twice"   . Lower extremity angiogram N/A 09/10/2013    Procedure: LOWER EXTREMITY ANGIOGRAM;  Surgeon: [name], MD;  Location: [facility] CATH LAB;  Service: Cardiovascular;  Laterality: N/A;  . Left heart catheterization with coronary angiogram N/A 10/31/2014    Procedure: LEFT HEART CATHETERIZATION WITH CORONARY ANGIOGRAM;  Surgeon: Burnell Blanks, MD;  Location: [facility] CATH LAB;  Service: Cardiovascular;  Laterality: N/A;  . Peripheral athrectomy Right 01/15/2015    SFA/notes 01/15/2015  . Balloon angioplasty, artery Right 01/15/2015    SFA/notes 01/15/2015  . Cardiac catheterization    . Lower extremity angiogram N/A 01/15/2015    Procedure: LOWER EXTREMITY ANGIOGRAM;  Surgeon: [name], MD;  Location: [facility] CATH LAB;  Service: Cardiovascular;  Laterality: N/A;  . Carotid angiogram N/A 01/15/2015    Procedure: CAROTID ANGIOGRAM;  Surgeon: [name], MD;  Location: [facility] CATH LAB;  Service: Cardiovascular;  Laterality: N/A;  . Endarterectomy Left 02/19/2015  Procedure: LEFT CAROTID ENDARTERECTOMY ;  Surgeon: Serafina Mitchell, MD;  Location: [facility] OR;  Service: Vascular;  Laterality: Left;   Family History  Problem Relation Age of Onset  . Hypertension Mother   . Diabetes Mother   . Cancer Mother     breast, ovarian, colon  . Clotting disorder Mother   . Heart disease Mother   . Heart attack Mother   . Hypertension Father   . Heart disease Father   . Emphysema Sister     smoked   History  Substance Use Topics  . Smoking status: Former Smoker -- 1.00 packs/day for 78  years    Types: Cigarettes, E-cigarettes    Quit date: 01/16/2015  . Smokeless tobacco: Never Used  . Alcohol Use: 0.0 oz/week    0 Standard drinks or equivalent per week     Comment: 01/15/2015 "might drink 1 beer/wk; never had problem w/it"   OB History    Gravida Para Term Preterm AB TAB SAB Ectopic Multiple Living   1 1 1       1      Review of Systems  Constitutional: Positive for unexpected weight change. Negative for fever.  HENT: Negative for trouble swallowing.   Eyes: Negative for visual disturbance.  Respiratory: Positive for shortness of breath.   Cardiovascular: Positive for leg swelling. Negative for chest pain.  Gastrointestinal: Negative for nausea, vomiting and abdominal pain.  Genitourinary: Negative for dysuria.  Musculoskeletal: Negative for neck pain.  Skin: Negative for rash.  Neurological: Negative for dizziness, weakness and numbness.  Psychiatric/Behavioral: Negative.       Allergies  Amiodarone  Home Medications   Prior to Admission medications   Medication Sig Start Date End Date Taking? Authorizing Provider  albuterol (PROVENTIL) (2.5 MG/3ML) 0.083% nebulizer solution Take 2.5 mg by nebulization every 6 (six) hours as needed. For wheezing.   Yes Historical Provider, MD  aspirin 81 MG tablet Take 81 mg by mouth daily.   Yes Historical Provider, MD  carvedilol (COREG) 6.25 MG tablet Take 1 tablet (6.25 mg total) by mouth 2 (two) times daily with a meal. 02/05/15  Yes [name], MD  clopidogrel (PLAVIX) 75 MG tablet Take 1 tablet (75 mg total) by mouth daily with breakfast. 01/16/15  Yes Rogelia Mire, NP  furosemide (LASIX) 40 MG tablet Take 1 tablet (40 mg total) by mouth 2 (two) times daily. 12/09/14  Yes Josue Hector, MD  gabapentin (NEURONTIN) 300 MG capsule Take 400 mg by mouth 4 (four) times daily.    Yes Historical Provider, MD  hydrOXYzine (ATARAX/VISTARIL) 50 MG tablet Take 50 mg by mouth at bedtime. For sleep   Yes Historical  Provider, MD  insulin aspart (NOVOLOG) 100 UNIT/ML injection Inject 7-10 Units into the skin 3 (three) times daily with meals. Sliding scale, usually takes 10 units in am, 7 units in pm   Yes Historical Provider, MD  insulin glargine (LANTUS) 100 UNIT/ML injection Inject 35 Units into the skin at bedtime.   Yes Historical Provider, MD  ipratropium (ATROVENT HFA) 17 MCG/ACT inhaler Inhale 2 puffs into the lungs 2 (two) times daily.    Yes Historical Provider, MD  levothyroxine (SYNTHROID, LEVOTHROID) 50 MCG tablet Take 50 mcg by mouth daily.   Yes Historical Provider, MD  lisinopril (PRINIVIL,ZESTRIL) 20 MG tablet Take 20 mg by mouth daily.   Yes Historical Provider, MD  metFORMIN (GLUCOPHAGE) 1000 MG tablet Take 1,000 mg by mouth 2 (two) times daily with  a meal.   Yes Historical Provider, MD  metoprolol (LOPRESSOR) 50 MG tablet Take 50 mg by mouth 2 (two) times daily.   Yes Historical Provider, MD  nitroGLYCERIN (NITROSTAT) 0.4 MG SL tablet Place 1 tablet (0.4 mg total) under the tongue every 5 (five) minutes as needed for chest pain. 01/16/15  Yes Rogelia Mire, NP  pantoprazole (PROTONIX) 40 MG tablet Take 40 mg by mouth daily. 02/13/15 02/13/16 Yes Historical Provider, MD  pravastatin (PRAVACHOL) 80 MG tablet Take 1 tablet (80 mg total) by mouth daily at 6 PM. 04/01/15  Yes [name], MD  PRESCRIPTION MEDICATION Apply 1 application topically daily.   Yes Historical Provider, MD  oxyCODONE (ROXICODONE) 5 MG immediate release tablet Take 1 tablet (5 mg total) by mouth every 6 (six) hours as needed. Patient not taking: Reported on 04/29/2015 02/21/15   Joelene Millin A Trinh, PA-C   BP 162/90 mmHg  Pulse 96  Temp(Src) 98.5 F (36.9 C) (Oral)  Resp 19  SpO2 96%  LMP 11/27/2012 Physical Exam  Constitutional: She is oriented to person, place, and time. She appears well-developed and well-nourished. No distress.  HENT:  Head: Normocephalic and atraumatic.  Mouth/Throat: Oropharynx is clear and  moist. No oropharyngeal exudate.  Eyes: Right eye exhibits no discharge. Left eye exhibits no discharge. No scleral icterus.  Neck: Normal range of motion.  Cardiovascular: Normal rate, regular rhythm, S1 normal, S2 normal and normal heart sounds.   No murmur heard. Pulmonary/Chest: Effort normal. No accessory muscle usage. No tachypnea. No respiratory distress. She has rales in the right lower field and the left lower field.  Abdominal: Soft. There is no tenderness.  Musculoskeletal: Normal range of motion. She exhibits no edema or tenderness.  Lymphadenopathy:  2+ pitting edema bilaterally.  Neurological: She is alert and oriented to person, place, and time. No cranial nerve deficit. Coordination normal.  Skin: Skin is warm and dry. No rash noted. She is not diaphoretic.  Psychiatric: She has a normal mood and affect.  Nursing note and vitals reviewed.   ED Course  Procedures (including critical care time) Labs Review Labs Reviewed  CBC - Abnormal; Notable for the following:    RBC 3.36 (*)    Hemoglobin 9.0 (*)    HCT 29.5 (*)    RDW 16.0 (*)    All other components within normal limits  BASIC METABOLIC PANEL - Abnormal; Notable for the following:    Glucose, Bld 199 (*)    BUN 22 (*)    Creatinine, Ser 1.02 (*)    Calcium 8.8 (*)    All other components within normal limits  BRAIN NATRIURETIC PEPTIDE - Abnormal; Notable for the following:    B Natriuretic Peptide 1483.5 (*)    All other components within normal limits  I-STAT TROPOININ, ED    Imaging Review Dg Chest 2 View  04/29/2015   CLINICAL DATA:  Shortness of breath for 3 days.  EXAM: CHEST  2 VIEW  COMPARISON:  02/17/2015  FINDINGS: Cardiac silhouette is upper limits of normal to mildly enlarged in size, not significantly changed. Thoracic aortic calcification is noted. There is increased peribronchial cuffing and interstitial densities bilaterally, greatest in the lung bases. No segmental airspace consolidation,  pleural effusion, or pneumothorax is identified. No acute osseous abnormality is seen.  IMPRESSION: Increased interstitial densities bilaterally, which may represent developing interstitial edema.   Electronically Signed   By: Logan Bores   On: 04/29/2015 16:20     EKG  Interpretation   Date/Time:  Wednesday April 29 2015 15:24:55 EDT Ventricular Rate:  79 PR Interval:  142 QRS Duration: 84 QT Interval:  462 QTC Calculation: 529 R Axis:   23 Text Interpretation:  Normal sinus rhythm Nonspecific T wave abnormality  Prolonged QT Otherwise no significant change Confirmed by HARRISON  MD,  FORREST ([id number]) on 04/29/2015 6:51:56 PM      MDM   Final diagnoses:  DOE (dyspnea on exertion)    Patient is progressive 2 weeks of dyspnea on exertion along with orthopnea at night. Patient noted to be fluid overloaded on exam, no acute distress at rest. Patient was given IV Lasix in the ER, had reasonable output, was ambulated, oxygen saturations dropped to 89% during ambulation with dyspnea. Patient continues to deny any chest pain during her stay here. Likely patient experiencing fluid overload in the setting of CHF, eating fried foods over the last week and taking a lower Lasix dosage. Plan for admission for hypoxia with exertion. Spoke with Dr. Hal Hope to admit to medicine in telemetry. The patient appears reasonably stabilized for admission considering the current resources, flow, and capabilities available in the ED at this time, and I doubt any other [facility] requiring further screening and/or treatment in the ED prior to admission.  BP 162/90 mmHg  Pulse 96  Temp(Src) 98.5 F (36.9 C) (Oral)  Resp 19  SpO2 96%  LMP 11/27/2012  Signed,  Dahlia Bailiff, PA-C 9:49 PM     Dahlia Bailiff, PA-C 04/29/15 2149  Pamella Pert, MD 04/30/15 [phone]

## 2015-04-29 NOTE — ED Notes (Signed)
Pt in c/o shortness of breath while laying down, states it is improved if she sits up, also worse with exertion, symptoms over the last few days, denies cough or fever, no distress noted

## 2015-04-29 NOTE — Progress Notes (Signed)
PHARMACY CONSULT Medication reconciliation    Pt has been taking both carvedilol and metoprolol since April 2016. After her admission in April 2016, she was supposed to have stopped metoprolol and only continued with carvedilol.  I spoke with the patient regarding this issue.  Upon discharge medication reconciliation please do not continue her metoprolol.  Thank you   Hughes Better, PharmD, BCPS Clinical Pharmacist Pager: [phone] 04/29/2015 8:26 PM

## 2015-04-30 DIAGNOSIS — I5021 Acute systolic (congestive) heart failure: Secondary | ICD-10-CM

## 2015-04-30 DIAGNOSIS — E876 Hypokalemia: Secondary | ICD-10-CM

## 2015-04-30 DIAGNOSIS — E119 Type 2 diabetes mellitus without complications: Secondary | ICD-10-CM

## 2015-04-30 LAB — BASIC METABOLIC PANEL
Anion gap: 8 (ref 5–15)
BUN: 21 mg/dL — ABNORMAL HIGH (ref 6–20)
CO2: 32 mmol/L (ref 22–32)
Calcium: 8.6 mg/dL — ABNORMAL LOW (ref 8.9–10.3)
Chloride: 101 mmol/L (ref 101–111)
Creatinine, Ser: 0.87 mg/dL (ref 0.44–1.00)
GFR calc Af Amer: >60 mL/min (ref >60)
GFR calc non Af Amer: >60 mL/min (ref >60)
GLUCOSE: 172 mg/dL — AB (ref 65–99)
POTASSIUM: 3.4 mmol/L — AB (ref 3.5–5.1)
SODIUM: 141 mmol/L (ref 135–145)

## 2015-04-30 LAB — GLUCOSE, CAPILLARY
GLUCOSE-CAPILLARY: 172 mg/dL — AB (ref 65–99)
GLUCOSE-CAPILLARY: 269 mg/dL — AB (ref 65–99)
GLUCOSE-CAPILLARY: 188 mg/dL — AB (ref 65–99)
GLUCOSE-CAPILLARY: 223 mg/dL — AB (ref 65–99)

## 2015-04-30 LAB — TSH: TSH: 2.58 u[IU]/mL (ref 0.350–4.500)

## 2015-04-30 MED ORDER — POTASSIUM CHLORIDE CRYS ER 20 MEQ PO TBCR
40.0000 meq | EXTENDED_RELEASE_TABLET | Freq: Every day | ORAL | Status: DC
  Administered 2015-04-30 – 2015-05-03 (×4): 40 meq via ORAL
  Filled 2015-04-30 (×4): qty 2

## 2015-04-30 NOTE — Progress Notes (Signed)
[facility] PROGRESS NOTE  AVILENE BURGENER [id number] DOB: June 15, 1961 DOA: 04/29/2015 PCP: Kevan Ny, MD   Brief narrative 54 year old female with history of chronic systolic CHF last EF of [id number] presented with acute on chronic CHF exacerbation. She had gained almost 40 pounds over past 1 week.    Assessment/Plan: Acute on chronic systolic CHF. No clear etiology that trigger her symptoms. Patient informs being compliant with her diet and medications. Placed on IV Lasix 40 mg every 8 hours. Strict I/O and daily weight. Monitor electrolytes. Continue lisinopril , statin and metoprolol. -Follows with Dr. Gwenlyn Found.  Type 2 diabetes mellitus Holding metformin. Monitor on sliding scale insulin.  Paroxysmal A. fib Continue aspirin and metoprolol. Was previously on amiodarone and Xarelto which patient discontinued self due to cost issues. Stable on telemetry.  COPD Currently asymptomatic. Resume home inhalers.  Carotid artery disease with recent carotid endarterectomy. Continue Plavix and statin.  Peripheral vascular disease status post angioplasty Continue Plavix.  Diet: Heart healthy/diabetic DVT prophylaxis: Subcutaneous Lovenox   Code Status: Full code Family Communication: None at bedside Disposition Plan: Home possibly in the next 48-72 hours   Consultants:  None  Procedures:  None  Antibiotics:  None  HPI/Subjective: Patient seen and examined. Shortness of breath mildly improved since admission.  Objective: Filed Vitals:   04/30/15 0614  BP: 134/60  Pulse: 95  Temp: 98.1 F (36.7 C)  Resp: 19    Intake/Output Summary (Last 24 hours) at 04/30/15 0954 Last data filed at 04/30/15 0210  Gross per 24 hour  Intake    240 ml  Output    600 ml  Net   -360 ml   Filed Weights   04/29/15 2235 04/30/15 0614  Weight: 105.824 kg (233 lb 4.8 oz) 104.599 kg (230 lb 9.6 oz)    Exam:   General:  Middle aged female in no acute distress  HEENT: No  pallor, moist oral mucosa, JVD +, supple neck  Cardiovascular: Normal S1 and S2, no murmurs rub or gallop  Respiratory: Bibasilar crackles, no rhonchi or wheeze  Abdomen: , Nondistended, nontender, bowel sounds present  Musculoskeletal: 2+ pitting edema bilaterally  CNS: Alert and oriented  Data Reviewed: Basic Metabolic Panel:  Recent Labs Lab 04/29/15 1553 04/30/15 0458  NA 140 141  K 4.3 3.4*  CL 104 101  CO2 29 32  GLUCOSE 199* 172*  BUN 22* 21*  CREATININE 1.02* 0.87  CALCIUM 8.8* 8.6*   Liver Function Tests: No results for input(s): AST, ALT, ALKPHOS, BILITOT, PROT, ALBUMIN in the last 168 hours. No results for input(s): LIPASE, AMYLASE in the last 168 hours. No results for input(s): AMMONIA in the last 168 hours. CBC:  Recent Labs Lab 04/29/15 1553  WBC 9.0  HGB 9.0*  HCT 29.5*  MCV 87.8  PLT 298   Cardiac Enzymes: No results for input(s): CKTOTAL, CKMB, CKMBINDEX, TROPONINI in the last 168 hours. BNP (last 3 results)  Recent Labs  04/29/15 1553  BNP 1483.5*    ProBNP (last 3 results)  Recent Labs  06/16/14 1050 09/07/14 0406 10/29/14 0222  PROBNP 2014.0* 1423.0* 1070.0*    CBG:  Recent Labs Lab 04/29/15 2254 04/30/15 0757  GLUCAP 218* 172*    No results found for this or any previous visit (from the past 240 hour(s)).   Studies: Dg Chest 2 View  04/29/2015   CLINICAL DATA:  Shortness of breath for 3 days.  EXAM: CHEST  2 VIEW  COMPARISON:  02/17/2015  FINDINGS:  Cardiac silhouette is upper limits of normal to mildly enlarged in size, not significantly changed. Thoracic aortic calcification is noted. There is increased peribronchial cuffing and interstitial densities bilaterally, greatest in the lung bases. No segmental airspace consolidation, pleural effusion, or pneumothorax is identified. No acute osseous abnormality is seen.  IMPRESSION: Increased interstitial densities bilaterally, which may represent developing interstitial  edema.   Electronically Signed   By: Logan Bores   On: 04/29/2015 16:20    Scheduled Meds: . aspirin EC  81 mg Oral Daily  . carvedilol  6.25 mg Oral BID WC  . clopidogrel  75 mg Oral Q breakfast  . enoxaparin (LOVENOX) injection  40 mg Subcutaneous Daily  . furosemide  40 mg Intravenous 3 times per day  . gabapentin  400 mg Oral TID AC & HS  . hydrOXYzine  50 mg Oral QHS  . insulin aspart  0-9 Units Subcutaneous TID WC  . insulin glargine  35 Units Subcutaneous QHS  . ipratropium  0.5 mg Inhalation BID  . levothyroxine  50 mcg Oral QAC breakfast  . lisinopril  20 mg Oral Daily  . pantoprazole  40 mg Oral Daily  . potassium chloride  40 mEq Oral Daily  . pravastatin  80 mg Oral q1800  . sodium chloride  3 mL Intravenous Q12H   Continuous Infusions:    Time spent: 25 minutes    Bristyn Kulesza, [place]  [facility] Pager [phone] If 7PM-7AM, please contact night-coverage at [email or web address], password [facility] 04/30/2015, 9:54 AM  LOS: 1 day

## 2015-04-30 NOTE — Progress Notes (Signed)
Inpatient Diabetes Program Recommendations  AACE/ADA: New Consensus Statement on Inpatient Glycemic Control (2013)  Target Ranges:  Prepandial:   less than 140 mg/dL      Peak postprandial:   less than 180 mg/dL (1-2 hours)      Critically ill patients:  140 - 180 mg/dL   Inpatient Diabetes Program Recommendations Correction (SSI): increase to moderate scale Insulin - Meal Coverage: add Novolog 4 units TID with meals per Glycemic Control order set Thank you  Raoul Pitch BSN, RN,CDE Inpatient Diabetes Coordinator [phone] (team pager)
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
## 2015-05-01 LAB — GLUCOSE, CAPILLARY
GLUCOSE-CAPILLARY: 251 mg/dL — AB (ref 65–99)
GLUCOSE-CAPILLARY: 260 mg/dL — AB (ref 65–99)
Glucose-Capillary: 143 mg/dL — ABNORMAL HIGH (ref 65–99)
Glucose-Capillary: 270 mg/dL — ABNORMAL HIGH (ref 65–99)

## 2015-05-01 LAB — BASIC METABOLIC PANEL
Anion gap: 9 (ref 5–15)
BUN: 23 mg/dL — AB (ref 6–20)
CO2: 33 mmol/L — AB (ref 22–32)
CREATININE: 1.12 mg/dL — AB (ref 0.44–1.00)
Calcium: 8.9 mg/dL (ref 8.9–10.3)
Chloride: 96 mmol/L — ABNORMAL LOW (ref 101–111)
GFR calc Af Amer: >60 mL/min (ref >60)
GFR calc non Af Amer: 55 mL/min — ABNORMAL LOW (ref >60)
GLUCOSE: 165 mg/dL — AB (ref 65–99)
POTASSIUM: 3.9 mmol/L (ref 3.5–5.1)
Sodium: 138 mmol/L (ref 135–145)

## 2015-05-01 NOTE — Progress Notes (Signed)
[facility] PROGRESS NOTE  [name] [id number] DOB: 1961/01/19 DOA: 04/29/2015 PCP: Kevan Ny, MD   Brief narrative 54 year old female with history of chronic systolic CHF last EF of [id number] presented with acute on chronic CHF exacerbation. She had gained almost 40 pounds over past 1 week.    Assessment/Plan: Acute on chronic systolic CHF. No clear etiology that trigger her symptoms. Patient informs being compliant with her diet and medications. Continue on IV Lasix 40 mg every 8 hours. Strict I/O and daily weight. Monitor electrolytes. Continue lisinopril , statin and metoprolol.net negative 4.5 L since admission weight down almost 16 lbs already. -Follows with Dr. Gwenlyn Found.  Type 2 diabetes mellitus Holding metformin. fsg stable on home dose lantus. Monitor on sliding scale insulin.  Paroxysmal A. fib Continue aspirin and metoprolol. Was previously on amiodarone and Xarelto which patient discontinued self due to cost issues. Stable on telemetry.  COPD  asymptomatic. Continue  home inhalers.  Carotid artery disease with recent carotid endarterectomy. Continue Plavix and statin.  Peripheral vascular disease status post angioplasty Continue Plavix.  Diet: Heart healthy/diabetic  DVT prophylaxis: Subcutaneous Lovenox   Code Status: Full code Family Communication: None at bedside Disposition Plan: Home possibly in the next 48 hours   Consultants:  None  Procedures:  None  Antibiotics:  None  HPI/Subjective: Patient seen and examined. Dyspnea much improved   Objective: Filed Vitals:   05/01/15 0753  BP: 130/69  Pulse: 75  Temp:   Resp: 19    Intake/Output Summary (Last 24 hours) at 05/01/15 0830 Last data filed at 05/01/15 0731  Gross per 24 hour  Intake    960 ml  Output   4300 ml  Net  -3340 ml   Filed Weights   04/29/15 2235 04/30/15 0614 05/01/15 0659  Weight: 105.824 kg (233 lb 4.8 oz) 104.599 kg (230 lb 9.6 oz) 102.74 kg (226 lb  8 oz)    Exam:   General:  Middle aged female in no acute distress  HEENT: No pallor, moist oral mucosa, JVD +, supple neck  Cardiovascular: Normal S1 and S2, no murmurs rub or gallop  Respiratory: Bibasilar crackles, no rhonchi or wheeze  Abdomen: , Nondistended, nontender,  Musculoskeletal: 1+ pitting edema bilaterally ( improved)  CNS: Alert and oriented  Data Reviewed: Basic Metabolic Panel:  Recent Labs Lab 04/29/15 1553 04/30/15 0458 05/01/15 0402  NA 140 141 138  K 4.3 3.4* 3.9  CL 104 101 96*  CO2 29 32 33*  GLUCOSE 199* 172* 165*  BUN 22* 21* 23*  CREATININE 1.02* 0.87 1.12*  CALCIUM 8.8* 8.6* 8.9   Liver Function Tests: No results for input(s): AST, ALT, ALKPHOS, BILITOT, PROT, ALBUMIN in the last 168 hours. No results for input(s): LIPASE, AMYLASE in the last 168 hours. No results for input(s): AMMONIA in the last 168 hours. CBC:  Recent Labs Lab 04/29/15 1553  WBC 9.0  HGB 9.0*  HCT 29.5*  MCV 87.8  PLT 298   Cardiac Enzymes: No results for input(s): CKTOTAL, CKMB, CKMBINDEX, TROPONINI in the last 168 hours. BNP (last 3 results)  Recent Labs  04/29/15 1553  BNP 1483.5*    ProBNP (last 3 results)  Recent Labs  06/16/14 1050 09/07/14 0406 10/29/14 0222  PROBNP 2014.0* 1423.0* 1070.0*    CBG:  Recent Labs Lab 04/30/15 0757 04/30/15 1147 04/30/15 1718 04/30/15 2152 05/01/15 0614  GLUCAP 172* 269* 188* 223* 143*    No results found for this or any previous visit (  from the past 240 hour(s)).   Studies: Dg Chest 2 View  04/29/2015   CLINICAL DATA:  Shortness of breath for 3 days.  EXAM: CHEST  2 VIEW  COMPARISON:  02/17/2015  FINDINGS: Cardiac silhouette is upper limits of normal to mildly enlarged in size, not significantly changed. Thoracic aortic calcification is noted. There is increased peribronchial cuffing and interstitial densities bilaterally, greatest in the lung bases. No segmental airspace consolidation, pleural  effusion, or pneumothorax is identified. No acute osseous abnormality is seen.  IMPRESSION: Increased interstitial densities bilaterally, which may represent developing interstitial edema.   Electronically Signed   By: Logan Bores   On: 04/29/2015 16:20    Scheduled Meds: . aspirin EC  81 mg Oral Daily  . carvedilol  6.25 mg Oral BID WC  . clopidogrel  75 mg Oral Q breakfast  . enoxaparin (LOVENOX) injection  40 mg Subcutaneous Daily  . furosemide  40 mg Intravenous 3 times per day  . gabapentin  400 mg Oral TID AC & HS  . hydrOXYzine  50 mg Oral QHS  . insulin aspart  0-9 Units Subcutaneous TID WC  . insulin glargine  35 Units Subcutaneous QHS  . ipratropium  0.5 mg Inhalation BID  . levothyroxine  50 mcg Oral QAC breakfast  . lisinopril  20 mg Oral Daily  . pantoprazole  40 mg Oral Daily  . potassium chloride  40 mEq Oral Daily  . pravastatin  80 mg Oral q1800  . sodium chloride  3 mL Intravenous Q12H   Continuous Infusions:    Time spent: 25 minutes    [name], [place]  [facility] Pager [phone] If 7PM-7AM, please contact night-coverage at [email or web address], password [facility] 05/01/2015, 8:30 AM  LOS: 2 days

## 2015-05-02 DIAGNOSIS — E1165 Type 2 diabetes mellitus with hyperglycemia: Secondary | ICD-10-CM

## 2015-05-02 LAB — GLUCOSE, CAPILLARY
GLUCOSE-CAPILLARY: 199 mg/dL — AB (ref 65–99)
GLUCOSE-CAPILLARY: 292 mg/dL — AB (ref 65–99)
Glucose-Capillary: 238 mg/dL — ABNORMAL HIGH (ref 65–99)
Glucose-Capillary: 216 mg/dL — ABNORMAL HIGH (ref 65–99)

## 2015-05-02 LAB — BASIC METABOLIC PANEL
Anion gap: 9 (ref 5–15)
BUN: 28 mg/dL — AB (ref 6–20)
CHLORIDE: 96 mmol/L — AB (ref 101–111)
CO2: 33 mmol/L — AB (ref 22–32)
CREATININE: 1.09 mg/dL — AB (ref 0.44–1.00)
Calcium: 8.9 mg/dL (ref 8.9–10.3)
GFR calc Af Amer: >60 mL/min (ref >60)
GFR calc non Af Amer: 57 mL/min — ABNORMAL LOW (ref >60)
GLUCOSE: 230 mg/dL — AB (ref 65–99)
Potassium: 4.4 mmol/L (ref 3.5–5.1)
Sodium: 138 mmol/L (ref 135–145)

## 2015-05-02 MED ORDER — CLOPIDOGREL BISULFATE 75 MG PO TABS
75.0000 mg | ORAL_TABLET | Freq: Every day | ORAL | Status: DC
  Administered 2015-05-02 – 2015-05-03 (×2): 75 mg via ORAL
  Filled 2015-05-02 (×3): qty 1

## 2015-05-02 MED ORDER — INSULIN GLARGINE 100 UNIT/ML ~~LOC~~ SOLN
40.0000 [IU] | Freq: Every day | SUBCUTANEOUS | Status: DC
  Administered 2015-05-02: 40 [IU] via SUBCUTANEOUS
  Filled 2015-05-02 (×2): qty 0.4

## 2015-05-02 MED ORDER — INSULIN ASPART 100 UNIT/ML ~~LOC~~ SOLN
3.0000 [IU] | Freq: Three times a day (TID) | SUBCUTANEOUS | Status: DC
  Administered 2015-05-02 (×2): 3 [IU] via SUBCUTANEOUS

## 2015-05-02 MED ORDER — INSULIN ASPART 100 UNIT/ML ~~LOC~~ SOLN
0.0000 [IU] | Freq: Three times a day (TID) | SUBCUTANEOUS | Status: DC
  Administered 2015-05-02: 8 [IU] via SUBCUTANEOUS
  Administered 2015-05-02: 5 [IU] via SUBCUTANEOUS
  Administered 2015-05-03: 3 [IU] via SUBCUTANEOUS
  Administered 2015-05-03: 5 [IU] via SUBCUTANEOUS

## 2015-05-02 MED ORDER — CARVEDILOL 6.25 MG PO TABS
6.2500 mg | ORAL_TABLET | Freq: Two times a day (BID) | ORAL | Status: DC
  Administered 2015-05-02 – 2015-05-03 (×3): 6.25 mg via ORAL
  Filled 2015-05-02 (×5): qty 1

## 2015-05-02 MED ORDER — FUROSEMIDE 10 MG/ML IJ SOLN
60.0000 mg | Freq: Two times a day (BID) | INTRAMUSCULAR | Status: DC
Start: 2015-05-02 — End: 2015-05-03
  Administered 2015-05-02 – 2015-05-03 (×2): 60 mg via INTRAVENOUS
  Filled 2015-05-02 (×3): qty 6

## 2015-05-02 NOTE — Care Management Note (Addendum)
Case Management Note  Patient Details  Name: [name] MRN: [id number] Date of Birth: 11/02/1961  Subjective/Objective:                   Shortness of breath Action/Plan:  Discharge planning Expected Discharge Date:  05/03/15               Expected Discharge Plan:  Home/Self Care  In-House Referral:     Discharge planning Services  CM Consult, Medication Assistance  Post Acute Care Choice:    Choice offered to:     DME Arranged:    DME Agency:     HH Arranged:    [place] Agency:     Status of Service:  Completed, signed off  Medicare Important Message Given:    Date Medicare IM Given:    Medicare IM give by:    Date Additional Medicare IM Given:    Additional Medicare Important Message give by:     If discussed at [place] of Stay Meetings, dates discussed:    Additional Comments: CM met with pt and pt's daughter in room and gave her a 30day free trial card for [facility].  Pt verbalized understanding the 30 day card will pay for her prescription upon discharge and give her PCP enough time to have her insurance authorize the medication to cover the refills.  No other CM needs were communicated. Dellie Catholic, RN 05/02/2015, 10:50 AM

## 2015-05-02 NOTE — Progress Notes (Addendum)
[facility] PROGRESS NOTE  [name] [id number] DOB: 1961/10/08 DOA: 04/29/2015 PCP: Kevan Ny, MD   Brief narrative 54 year old female with history of chronic systolic CHF last EF of [id number] presented with acute on chronic CHF exacerbation. She had gained almost 40 pounds over past 1 week.    Assessment/Plan: Acute on chronic systolic CHF. No clear etiology that trigger her symptoms. Patient informs being compliant with her diet and medications. Will need another day of IV diuresis. Switch  IV Lasix to 60 mg bid.. Strict I/O and daily weight. Monitor electrolytes. Continue lisinopril , statin and metoprolol. -Follows with Dr. Gwenlyn Found.  uncontrolled Type 2 diabetes mellitus Holding metformin. Last A1C of 10.  fsg elevated. Increase lantus to 40 u and add premeal aspart 3u tid . Switch to  moderate sliding scale insulin.  Paroxysmal A. fib Continue aspirin and metoprolol. Was previously on amiodarone and Xarelto which patient discontinued self due to cost issues. Stable on telemetry.  COPD  asymptomatic. Continue  home inhalers.  Carotid artery disease with recent carotid endarterectomy. Continue Plavix and statin.  Peripheral vascular disease status post angioplasty Continue Plavix.  Diet: Heart healthy/diabetic  DVT prophylaxis: Subcutaneous Lovenox   Code Status: Full code Family Communication: None at bedside Disposition Plan: Home tomorrow if diuresed adequately   Consultants:  None  Procedures:  None  Antibiotics:  None  HPI/Subjective: Patient seen and examined. Dyspnea and leg edema  continues to improve  Objective: Filed Vitals:   05/02/15 0430  BP: 142/68  Pulse: 101  Temp: 98 F (36.7 C)  Resp: 18    Intake/Output Summary (Last 24 hours) at 05/02/15 [id number] Last data filed at 05/02/15 0600  Gross per 24 hour  Intake   1200 ml  Output   3226 ml  Net  -2026 ml   Filed Weights   04/30/15 0614 05/01/15 0659 05/02/15 0430   Weight: 104.599 kg (230 lb 9.6 oz) 102.74 kg (226 lb 8 oz) 101.152 kg (223 lb)    Exam:   General:   no acute distress  HEENT: No pallor, moist oral mucosa, JVD +, supple neck  Cardiovascular: Normal S1 and S2, no murmurs rub or gallop  Respiratory: Bibasilar crackles( improved) , no rhonchi or wheeze  Abdomen: , Nondistended, nontender,  Musculoskeletal: 1+ pitting edema bilaterally ( improving daily)  CNS: Alert and oriented  Data Reviewed: Basic Metabolic Panel:  Recent Labs Lab 04/29/15 1553 04/30/15 0458 05/01/15 0402 05/02/15 0409  NA 140 141 138 138  K 4.3 3.4* 3.9 4.4  CL 104 101 96* 96*  CO2 29 32 33* 33*  GLUCOSE 199* 172* 165* 230*  BUN 22* 21* 23* 28*  CREATININE 1.02* 0.87 1.12* 1.09*  CALCIUM 8.8* 8.6* 8.9 8.9   Liver Function Tests: No results for input(s): AST, ALT, ALKPHOS, BILITOT, PROT, ALBUMIN in the last 168 hours. No results for input(s): LIPASE, AMYLASE in the last 168 hours. No results for input(s): AMMONIA in the last 168 hours. CBC:  Recent Labs Lab 04/29/15 1553  WBC 9.0  HGB 9.0*  HCT 29.5*  MCV 87.8  PLT 298   Cardiac Enzymes: No results for input(s): CKTOTAL, CKMB, CKMBINDEX, TROPONINI in the last 168 hours. BNP (last 3 results)  Recent Labs  04/29/15 1553  BNP 1483.5*    ProBNP (last 3 results)  Recent Labs  06/16/14 1050 09/07/14 0406 10/29/14 0222  PROBNP 2014.0* 1423.0* 1070.0*    CBG:  Recent Labs Lab 05/01/15 0614 05/01/15 1120 05/01/15 1617  05/01/15 2134 05/02/15 0607  GLUCAP 143* 270* 251* 260* 199*    No results found for this or any previous visit (from the past 240 hour(s)).   Studies: No results found.  Scheduled Meds: . aspirin EC  81 mg Oral Daily  . carvedilol  6.25 mg Oral BID WC  . clopidogrel  75 mg Oral Q breakfast  . enoxaparin (LOVENOX) injection  40 mg Subcutaneous Daily  . furosemide  40 mg Intravenous 3 times per day  . gabapentin  400 mg Oral TID AC & HS  .  hydrOXYzine  50 mg Oral QHS  . insulin aspart  0-15 Units Subcutaneous TID WC  . insulin glargine  40 Units Subcutaneous QHS  . ipratropium  0.5 mg Inhalation BID  . levothyroxine  50 mcg Oral QAC breakfast  . lisinopril  20 mg Oral Daily  . pantoprazole  40 mg Oral Daily  . potassium chloride  40 mEq Oral Daily  . pravastatin  80 mg Oral q1800  . sodium chloride  3 mL Intravenous Q12H   Continuous Infusions:    Time spent: 25 minutes    [name], [place]  [facility] Pager [phone] If 7PM-7AM, please contact night-coverage at [email or web address], password [facility] 05/02/2015, 8:22 AM  LOS: 3 days

## 2015-05-03 DIAGNOSIS — E1159 Type 2 diabetes mellitus with other circulatory complications: Secondary | ICD-10-CM

## 2015-05-03 DIAGNOSIS — I5043 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) and diastolic (congestive) heart failure: Secondary | ICD-10-CM

## 2015-05-03 LAB — GLUCOSE, CAPILLARY
Glucose-Capillary: 179 mg/dL — ABNORMAL HIGH (ref 65–99)
Glucose-Capillary: 219 mg/dL — ABNORMAL HIGH (ref 65–99)

## 2015-05-03 MED ORDER — INSULIN ASPART 100 UNIT/ML ~~LOC~~ SOLN
3.0000 [IU] | Freq: Three times a day (TID) | SUBCUTANEOUS | Status: DC
  Administered 2015-05-03 (×2): 3 [IU] via SUBCUTANEOUS

## 2015-05-03 MED ORDER — INSULIN GLARGINE 100 UNIT/ML ~~LOC~~ SOLN: 40.0000 [IU] | Freq: Every day | SUBCUTANEOUS | Status: DC

## 2015-05-03 MED ORDER — FUROSEMIDE 40 MG PO TABS: 60.0000 mg | ORAL_TABLET | Freq: Two times a day (BID) | ORAL | Status: DC

## 2015-05-03 NOTE — Progress Notes (Signed)
Patient alert and oriented.V/S stable , denies pain or shortness of breath. D/C instruction explain and given to the patient. Patient verbalized understanding, iv d/c.  Patient d/c per order.

## 2015-05-03 NOTE — Discharge Summary (Addendum)
Physician Discharge Summary  [name] [id number] DOB: April 28, 1961 DOA: 04/29/2015  PCP: Kevan Ny, MD  Admit date: 04/29/2015 Discharge date: 05/03/2015  Time spent: 35 minutes  Recommendations for Outpatient Follow-up:  1. Discharge home. Needs to follow-up with PCP and cardiology as outpatient.  Discharge weight: 220 pounds   Discharge Diagnoses:  Principal Problem:   Acute on chronic combined systolic and diastolic heart failure   Active Problems:   DM (diabetes mellitus), type 2 with peripheral vascular complications   Hypothyroidism   COPD (chronic obstructive pulmonary disease)    Discharge Condition: Fair  Diet recommendation: Heart healthy/ diabetic  Filed Weights   05/01/15 0659 05/02/15 0430 05/03/15 0631  Weight: 102.74 kg (226 lb 8 oz) 101.152 kg (223 lb) 100 kg (220 lb 7.4 oz)    History of present illness:  Refer to admission H&P for details, in brief,54 year old female with history of chronic systolic CHF last EF of [id number] presented with acute on chronic CHF exacerbation. She had gained almost 40 pounds over past 1 week. Patient admitted to telemetry for IV diuresis.  Hospital Course:  Acute on chronic systolic CHF. Suspect dietary nonadherence. Patient informs being compliant with her diet and medications. -Patient placed on IV Lasix and diuresed well with weight back to baseline. (-8.7 L since admission) Continue lisinopril , statin and metoprolol. -I will increase her Lasix dose to 60 mg twice daily. Patient provided with heart failure symptoms instructions. -Patient instructed to be compliant with her diet and added into her medications. Also instructed to monitor her weight daily and if has weight gain more than 3 pounds per day or 5 pounds per week taken a dose of Lasix and if still unimproved or has symptoms of worsened dyspnea, orthopnea or PND, increased abdominal distention or leg swellings or chest discomfort should call her PCP or her  cardiologist. -Follows with Dr. Gwenlyn Found.  uncontrolled Type 2 diabetes mellitus  Last A1C of 10. fsg elevated. Increase lantus to 40 u . continue home sliding scale insulin. Resume metformin upon discharge. Instructed on dietary adherence and losing weight.  Paroxysmal A. fib Continue aspirin and metoprolol. Was previously on amiodarone and Xarelto which patient discontinued self due to cost issues. Stable on telemetry.  COPD asymptomatic. Continue home inhalers.  Carotid artery disease with recent carotid endarterectomy. Continue Plavix and statin.  Peripheral vascular disease status post angioplasty Continue Plavix.  hypothyroidism Continue Synthroid   Code Status: Full code Family Communication: None at bedside Disposition Plan: Home with outpatient follow-up   Consultants:  None  Procedures:  None  Antibiotics:  None  Discharge Exam: Filed Vitals:   05/03/15 0748  BP: 123/96  Pulse: 102  Temp: 97.7 F (36.5 C)  Resp:      General: no acute distress  HEENT: No pallor, moist oral mucosa, no JVD, supple neck  Cardiovascular: Normal S1 and S2, no murmurs rub or gallop  Respiratory: Clear breath sounds bilaterally, no rhonchi or wheeze  Abdomen: , Nondistended, nontender,  Musculoskeletal: Trace pitting edema bilaterally  CNS: Alert and oriented  Discharge Instructions    Current Discharge Medication List    CONTINUE these medications which have CHANGED   Details  furosemide (LASIX) 40 MG tablet Take 1.5 tablets (60 mg total) by mouth 2 (two) times daily. Qty: 60 tablet, Refills: 11    insulin glargine (LANTUS) 100 UNIT/ML injection Inject 0.4 mLs (40 Units total) into the skin at bedtime. Qty: 10 mL, Refills: 11  CONTINUE these medications which have NOT CHANGED   Details  albuterol (PROVENTIL) (2.5 MG/3ML) 0.083% nebulizer solution Take 2.5 mg by nebulization every 6 (six) hours as needed. For wheezing.    aspirin 81 MG tablet  Take 81 mg by mouth daily.    carvedilol (COREG) 6.25 MG tablet Take 1 tablet (6.25 mg total) by mouth 2 (two) times daily with a meal. Qty: 60 tablet, Refills: 5    clopidogrel (PLAVIX) 75 MG tablet Take 1 tablet (75 mg total) by mouth daily with breakfast. Qty: 30 tablet, Refills: 6    gabapentin (NEURONTIN) 300 MG capsule Take 400 mg by mouth 4 (four) times daily.     hydrOXYzine (ATARAX/VISTARIL) 50 MG tablet Take 50 mg by mouth at bedtime. For sleep    insulin aspart (NOVOLOG) 100 UNIT/ML injection Inject 7-10 Units into the skin 3 (three) times daily with meals. Sliding scale, usually takes 10 units in am, 7 units in pm    ipratropium (ATROVENT HFA) 17 MCG/ACT inhaler Inhale 2 puffs into the lungs 2 (two) times daily.     levothyroxine (SYNTHROID, LEVOTHROID) 50 MCG tablet Take 50 mcg by mouth daily.    lisinopril (PRINIVIL,ZESTRIL) 20 MG tablet Take 20 mg by mouth daily.    metFORMIN (GLUCOPHAGE) 1000 MG tablet Take 1,000 mg by mouth 2 (two) times daily with a meal.    nitroGLYCERIN (NITROSTAT) 0.4 MG SL tablet Place 1 tablet (0.4 mg total) under the tongue every 5 (five) minutes as needed for chest pain. Qty: 25 tablet, Refills: 3    pantoprazole (PROTONIX) 40 MG tablet Take 40 mg by mouth daily.    pravastatin (PRAVACHOL) 80 MG tablet Take 1 tablet (80 mg total) by mouth daily at 6 PM. Qty: 30 tablet, Refills: 11    PRESCRIPTION MEDICATION Apply 1 application topically daily.    oxyCODONE (ROXICODONE) 5 MG immediate release tablet Take 1 tablet (5 mg total) by mouth every 6 (six) hours as needed. Qty: 30 tablet, Refills: 0      STOP taking these medications     metoprolol (LOPRESSOR) 50 MG tablet        Allergies  Allergen Reactions  . Amiodarone Nausea And Vomiting   Follow-up Information    Follow up with [name], [name], MD. Schedule an appointment as soon as possible for a visit in 1 week.   Specialty:  Internal Medicine   Contact information:   [address] [place] [address] [phone]        The results of significant diagnostics from this hospitalization (including imaging, microbiology, ancillary and laboratory) are listed below for reference.    Significant Diagnostic Studies: Dg Chest 2 View  04/29/2015   CLINICAL DATA:  Shortness of breath for 3 days.  EXAM: CHEST  2 VIEW  COMPARISON:  02/17/2015  FINDINGS: Cardiac silhouette is upper limits of normal to mildly enlarged in size, not significantly changed. Thoracic aortic calcification is noted. There is increased peribronchial cuffing and interstitial densities bilaterally, greatest in the lung bases. No segmental airspace consolidation, pleural effusion, or pneumothorax is identified. No acute osseous abnormality is seen.  IMPRESSION: Increased interstitial densities bilaterally, which may represent developing interstitial edema.   Electronically Signed   By: Logan Bores   On: 04/29/2015 16:20    Microbiology: No results found for this or any previous visit (from the past 240 hour(s)).   Labs: Basic Metabolic Panel:  Recent Labs Lab 04/29/15 1553 04/30/15 0458 05/01/15 0402 05/02/15 [id number]  NA 140 141 138 138  K 4.3 3.4* 3.9 4.4  CL 104 101 96* 96*  CO2 29 32 33* 33*  GLUCOSE 199* 172* 165* 230*  BUN 22* 21* 23* 28*  CREATININE 1.02* 0.87 1.12* 1.09*  CALCIUM 8.8* 8.6* 8.9 8.9   Liver Function Tests: No results for input(s): AST, ALT, ALKPHOS, BILITOT, PROT, ALBUMIN in the last 168 hours. No results for input(s): LIPASE, AMYLASE in the last 168 hours. No results for input(s): AMMONIA in the last 168 hours. CBC:  Recent Labs Lab 04/29/15 1553  WBC 9.0  HGB 9.0*  HCT 29.5*  MCV 87.8  PLT 298   Cardiac Enzymes: No results for input(s): CKTOTAL, CKMB, CKMBINDEX, TROPONINI in the last 168 hours. BNP: BNP (last 3 results)  Recent Labs  04/29/15 1553  BNP 1483.5*    ProBNP (last 3 results)  Recent Labs  06/16/14 1050 09/07/14 0406  10/29/14 0222  PROBNP 2014.0* 1423.0* 1070.0*    CBG:  Recent Labs Lab 05/02/15 0607 05/02/15 1121 05/02/15 1613 05/02/15 2115 05/03/15 0559  GLUCAP 199* 238* 292* 216* 179*       Signed:  Valera Vallas  [facility] 05/03/2015, 9:12 AM

## 2015-05-03 NOTE — Clinical Social Work Note (Signed)
Patient asked RN for transportation to [facility] and back to her hotel room. CSW unable to get approval for this transportation. Patient will need to make arrangements with her family for transport to get her medications. CSW has suggested to RN to provide the patient with a day supply of medications for the patient at discharge. CSW also provided the RN with the medicaid transport number to be given to the patient. CSW signing off.   Liz Beach MSW, [place], [place], [id number]

## 2015-05-03 NOTE — Discharge Instructions (Signed)
Monitor for increased dyspnea, orthopnea, chest discomfort or chest pain,  Leg swelling, abdominal distention, and/or weight gain of 3 pounds in one day or 5 pounds in one week.   Daily Weights and Symptom Monitoring: Weigh daily before breakfast and after voiding    Take an extra dose of furosemide if  weight gain of 3 pounds or more in 24 hours or 5 pounds in one week, OR worsening symptoms as noted above.  If symptoms persists or weight gain continues , please Call Physician/cardiologist .

## 2015-05-23 IMAGING — CR DG CHEST 2V
2 series · 2 of 2 positions shown · non-contrast
Comparison: 06/16/2014

CLINICAL DATA: Weakness. Shortness of breath. Edema. High blood
sugar.

EXAM:
CHEST  2 VIEW

[w chest pa]
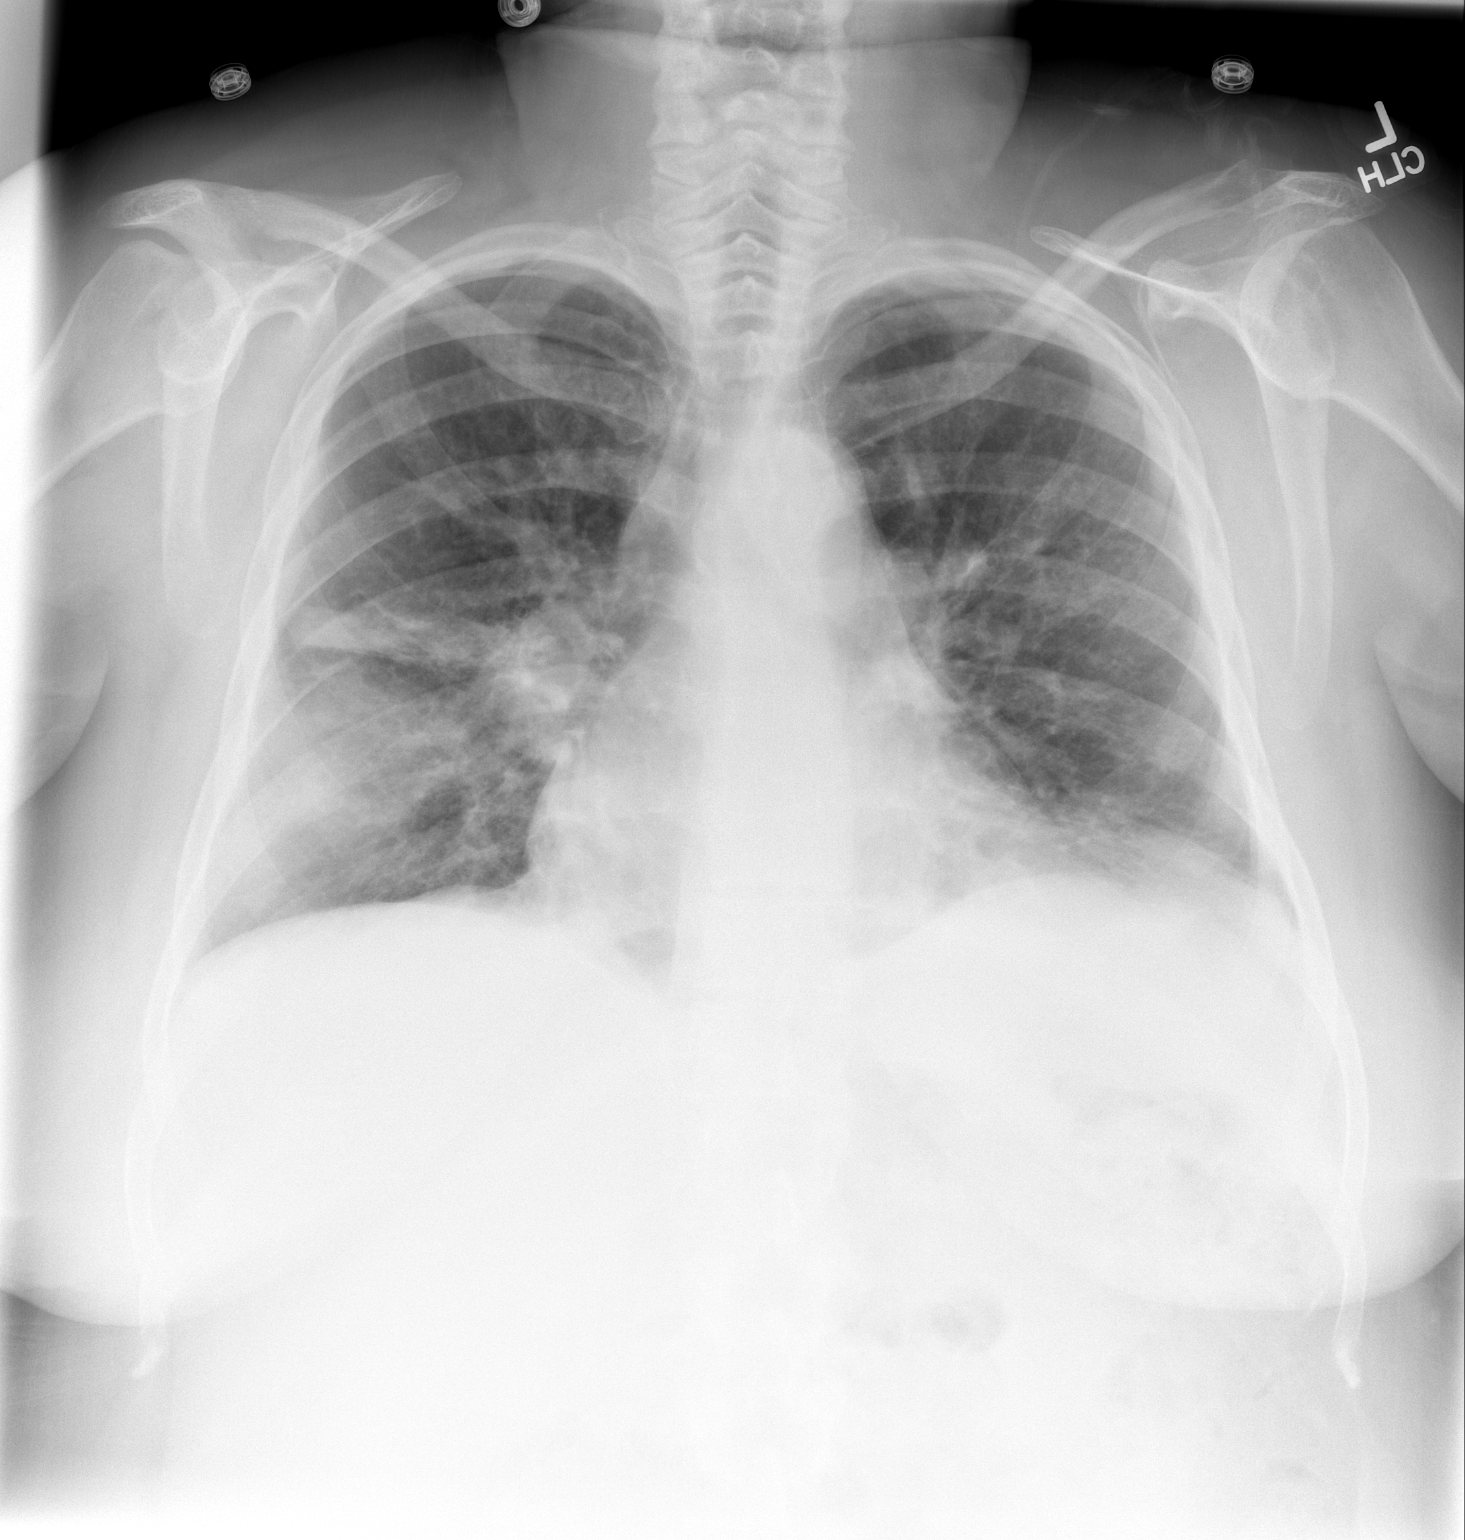

[w chest lat]
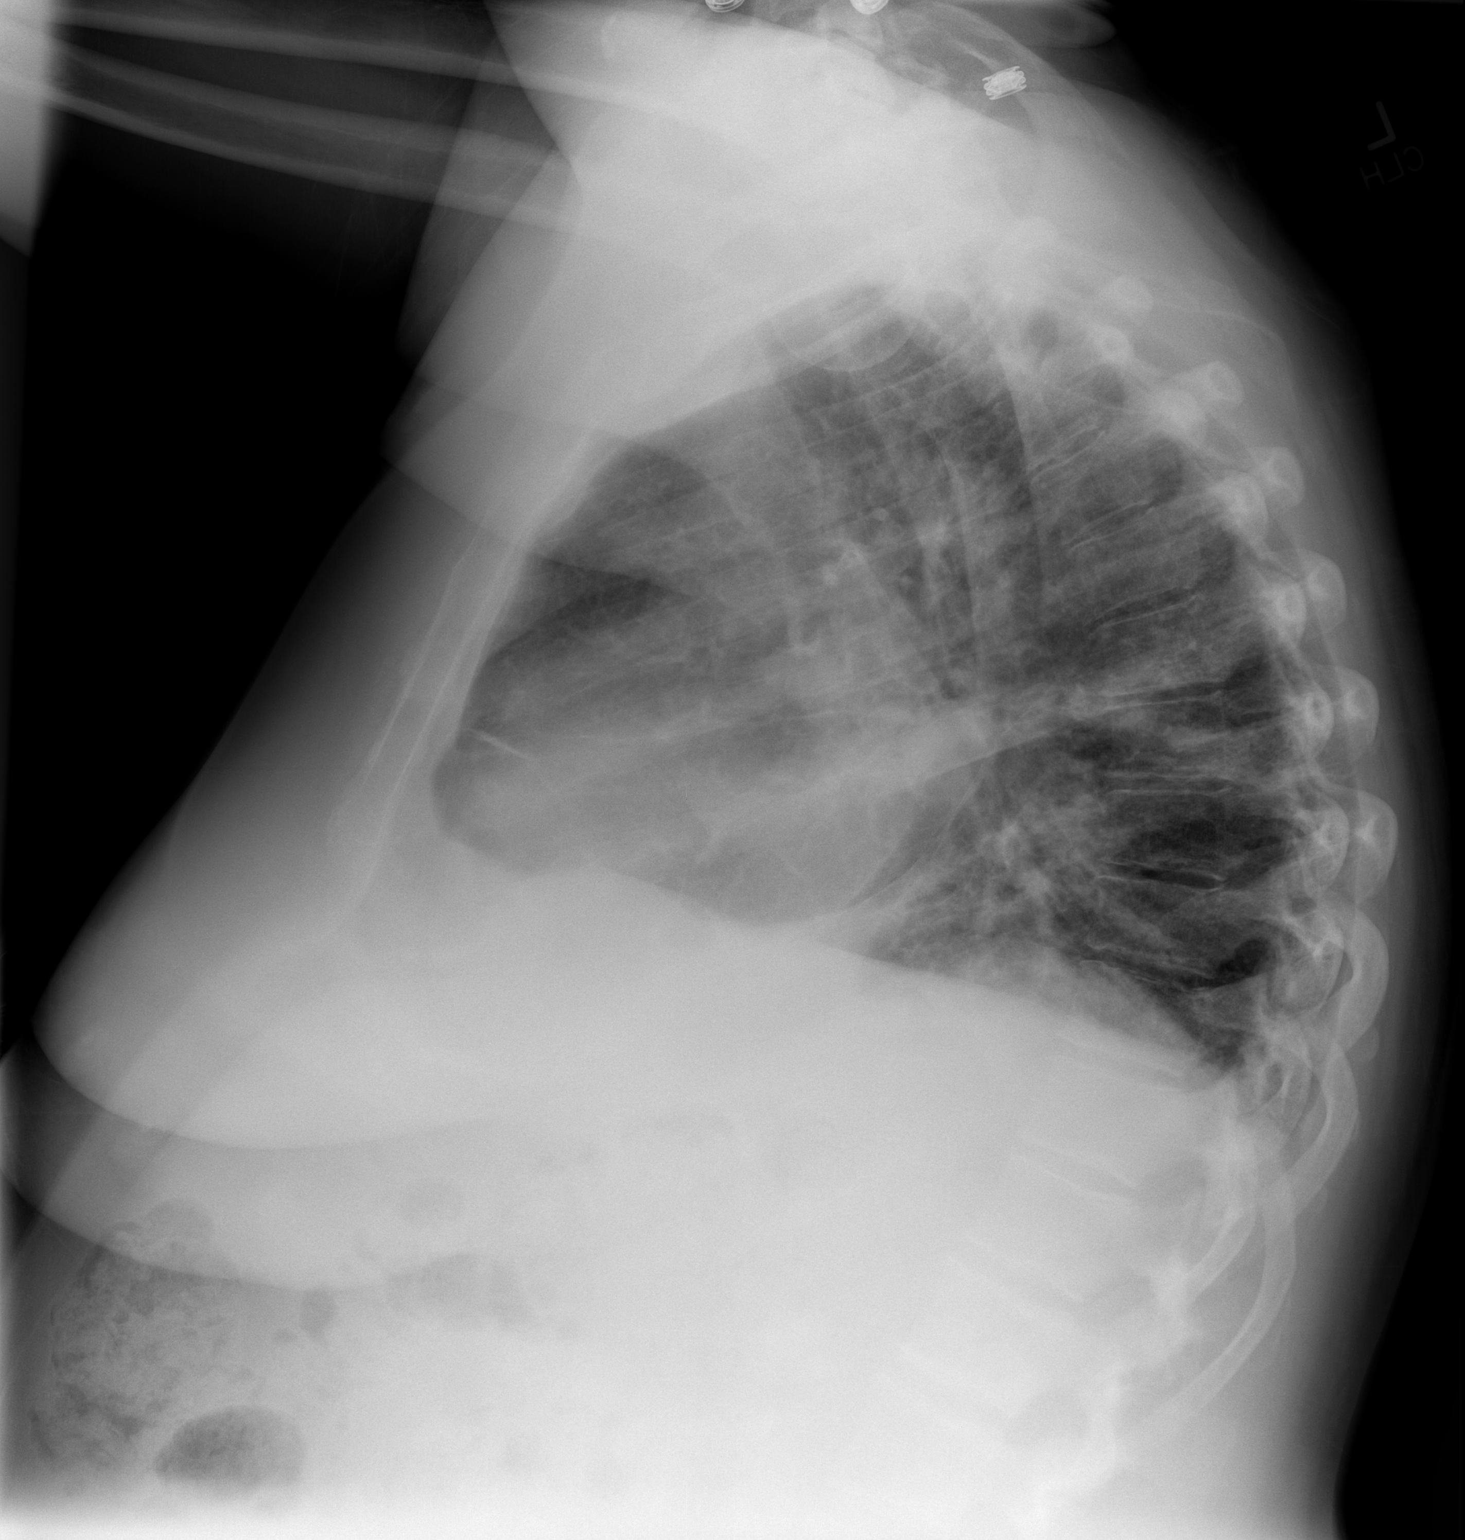

[2 of 2 positions shown; findings below may reference images not displayed]

FINDINGS: Shallow inspiration. Linear atelectasis or infiltration demonstrated
in the left lung base and right mid lung. Changes may represent
multifocal pneumonia. Changes are new since previous study. No
blunting of costophrenic angles. No pneumothorax. Heart size and
pulmonary vascularity are normal for technique.
IMPRESSION: Shallow inspiration with developing opacities in the right mid lung
and left lung base likely representing multifocal pneumonia.

## 2015-06-01 IMAGING — CR DG CHEST 2V
2 series · 2 of 2 positions shown · non-contrast
Comparison: 09/07/2014

CLINICAL DATA: COPD, history of tobacco use

EXAM:
CHEST  2 VIEW

[view not recorded (1 of 2)]
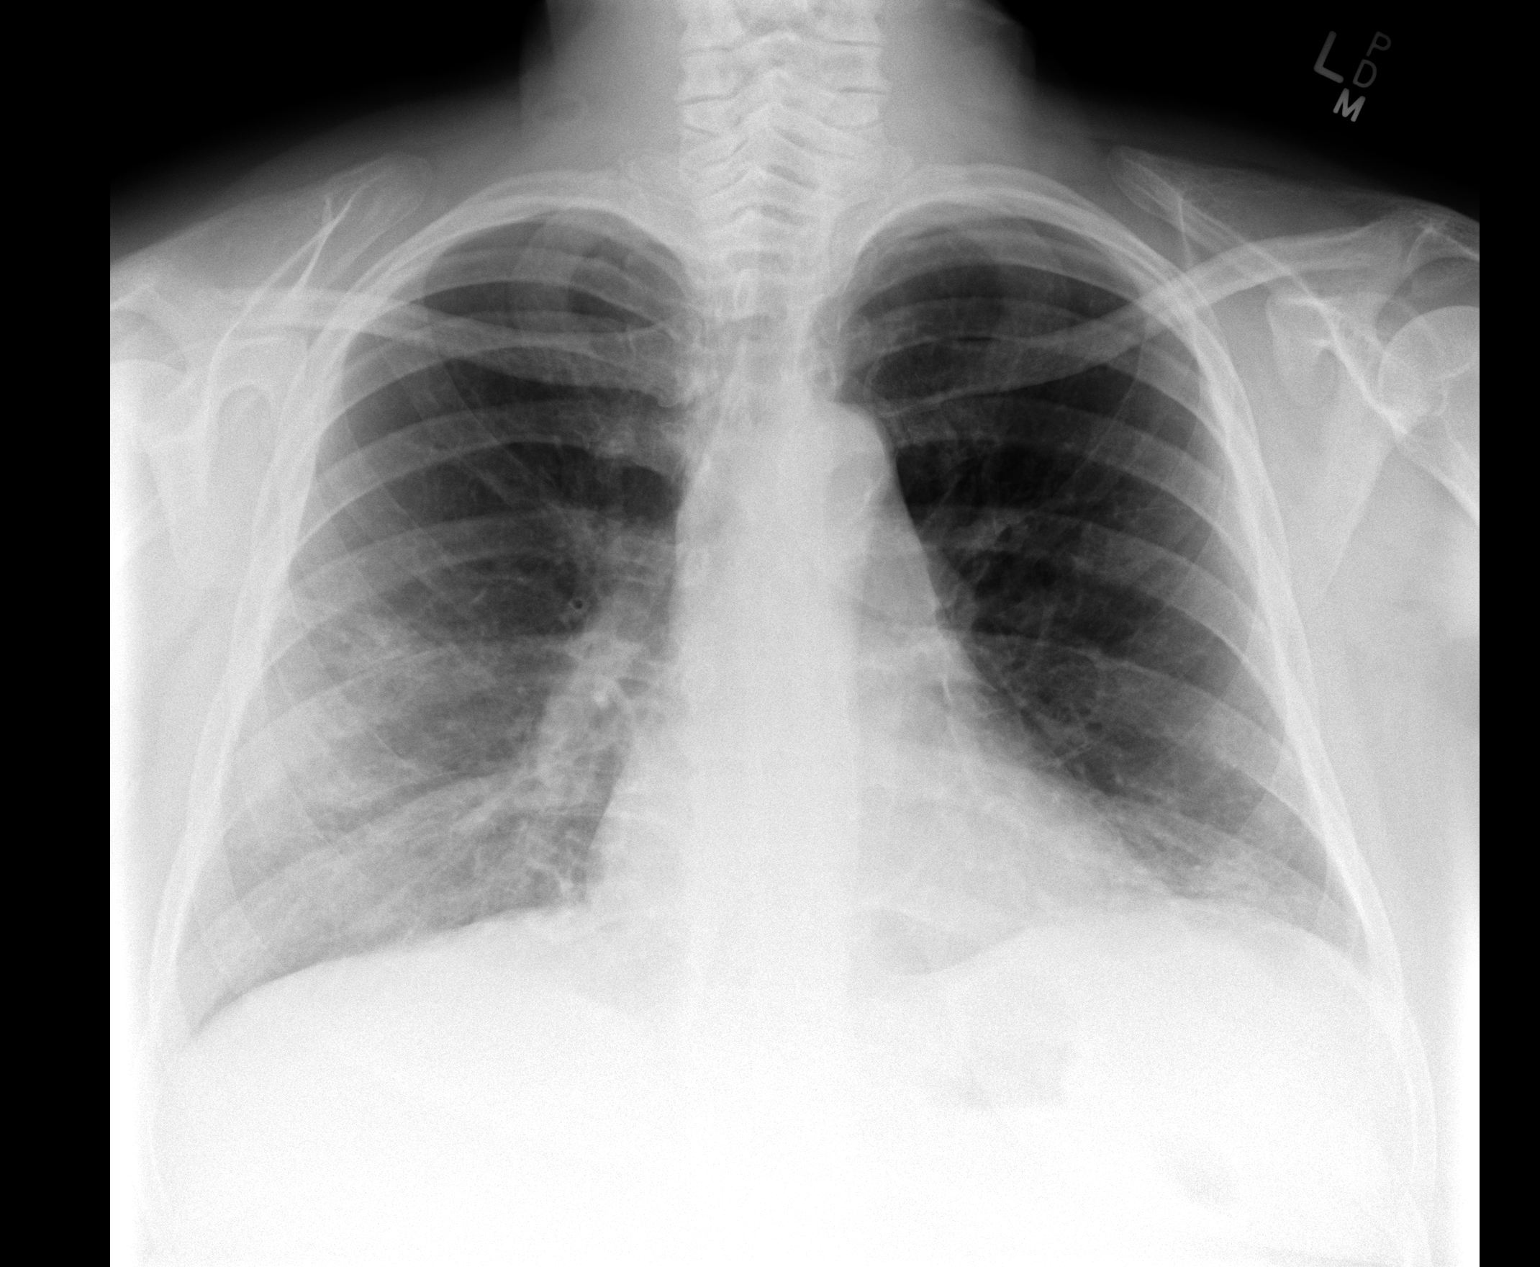

[view not recorded (2 of 2)]
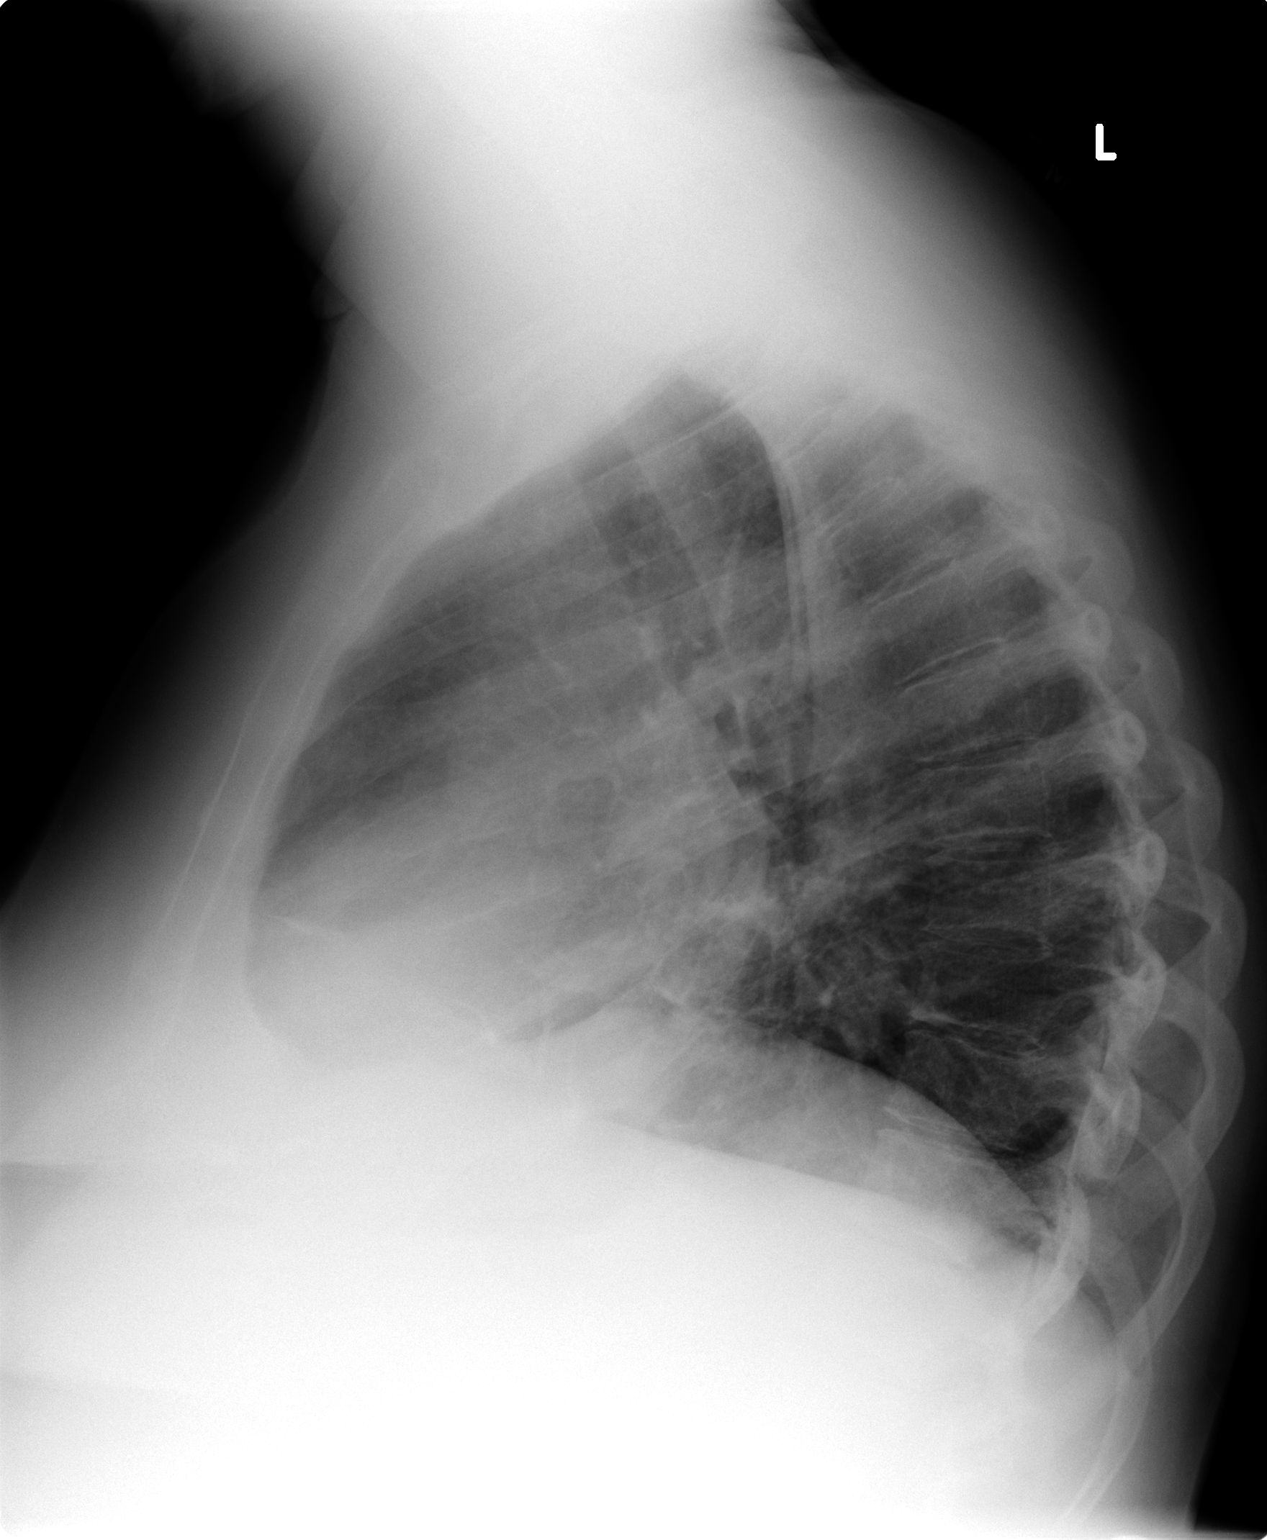

[2 of 2 positions shown; findings below may reference images not displayed]

FINDINGS: Cardiac shadow is stable. The previously seen multifocal infiltrates
have improved in the interval from the prior exam. Continued
followup is recommended. No new focal infiltrate is seen. No sizable
effusion is noted.
IMPRESSION: Improving infiltrates bilaterally. Continued followup is
recommended.

## 2015-06-14 ENCOUNTER — Encounter (HOSPITAL_COMMUNITY): Payer: Self-pay

## 2015-06-14 ENCOUNTER — Emergency Department (HOSPITAL_COMMUNITY): Payer: Medicaid Other

## 2015-06-14 ENCOUNTER — Inpatient Hospital Stay (HOSPITAL_COMMUNITY)
Admission: EM | Admit: 2015-06-14 | Discharge: 2015-06-19 | DRG: 291 | Disposition: A | Discharge status: Home / Self Care | Payer: Medicaid Other | Attending: Internal Medicine | Admitting: Internal Medicine

## 2015-06-14 DIAGNOSIS — Z8249 Family history of ischemic heart disease and other diseases of the circulatory system: Secondary | ICD-10-CM | POA: Diagnosis not present

## 2015-06-14 DIAGNOSIS — J9600 Acute respiratory failure, unspecified whether with hypoxia or hypercapnia: Secondary | ICD-10-CM | POA: Diagnosis present

## 2015-06-14 DIAGNOSIS — Z79899 Other long term (current) drug therapy: Secondary | ICD-10-CM

## 2015-06-14 DIAGNOSIS — D638 Anemia in other chronic diseases classified elsewhere: Secondary | ICD-10-CM | POA: Diagnosis present

## 2015-06-14 DIAGNOSIS — I2510 Atherosclerotic heart disease of native coronary artery without angina pectoris: Secondary | ICD-10-CM | POA: Diagnosis present

## 2015-06-14 DIAGNOSIS — I429 Cardiomyopathy, unspecified: Secondary | ICD-10-CM | POA: Diagnosis present

## 2015-06-14 DIAGNOSIS — F1010 Alcohol abuse, uncomplicated: Secondary | ICD-10-CM | POA: Diagnosis present

## 2015-06-14 DIAGNOSIS — J449 Chronic obstructive pulmonary disease, unspecified: Secondary | ICD-10-CM | POA: Diagnosis present

## 2015-06-14 DIAGNOSIS — I739 Peripheral vascular disease, unspecified: Secondary | ICD-10-CM

## 2015-06-14 DIAGNOSIS — E1151 Type 2 diabetes mellitus with diabetic peripheral angiopathy without gangrene: Secondary | ICD-10-CM | POA: Diagnosis present

## 2015-06-14 DIAGNOSIS — Z888 Allergy status to other drugs, medicaments and biological substances status: Secondary | ICD-10-CM | POA: Diagnosis not present

## 2015-06-14 DIAGNOSIS — F419 Anxiety disorder, unspecified: Secondary | ICD-10-CM | POA: Diagnosis present

## 2015-06-14 DIAGNOSIS — K219 Gastro-esophageal reflux disease without esophagitis: Secondary | ICD-10-CM | POA: Diagnosis present

## 2015-06-14 DIAGNOSIS — Z6841 Body Mass Index (BMI) 40.0 and over, adult: Secondary | ICD-10-CM

## 2015-06-14 DIAGNOSIS — Z720 Tobacco use: Secondary | ICD-10-CM | POA: Diagnosis not present

## 2015-06-14 DIAGNOSIS — I998 Other disorder of circulatory system: Secondary | ICD-10-CM | POA: Diagnosis present

## 2015-06-14 DIAGNOSIS — Z9862 Peripheral vascular angioplasty status: Secondary | ICD-10-CM | POA: Diagnosis not present

## 2015-06-14 DIAGNOSIS — I779 Disorder of arteries and arterioles, unspecified: Secondary | ICD-10-CM | POA: Diagnosis present

## 2015-06-14 DIAGNOSIS — D72829 Elevated white blood cell count, unspecified: Secondary | ICD-10-CM | POA: Diagnosis not present

## 2015-06-14 DIAGNOSIS — I4891 Unspecified atrial fibrillation: Secondary | ICD-10-CM | POA: Diagnosis present

## 2015-06-14 DIAGNOSIS — Z87898 Personal history of other specified conditions: Secondary | ICD-10-CM | POA: Diagnosis not present

## 2015-06-14 DIAGNOSIS — T380X5A Adverse effect of glucocorticoids and synthetic analogues, initial encounter: Secondary | ICD-10-CM | POA: Diagnosis not present

## 2015-06-14 DIAGNOSIS — I6529 Occlusion and stenosis of unspecified carotid artery: Secondary | ICD-10-CM | POA: Diagnosis present

## 2015-06-14 DIAGNOSIS — E1169 Type 2 diabetes mellitus with other specified complication: Secondary | ICD-10-CM | POA: Diagnosis present

## 2015-06-14 DIAGNOSIS — Z833 Family history of diabetes mellitus: Secondary | ICD-10-CM | POA: Diagnosis not present

## 2015-06-14 DIAGNOSIS — I10 Essential (primary) hypertension: Secondary | ICD-10-CM | POA: Diagnosis present

## 2015-06-14 DIAGNOSIS — R05 Cough: Secondary | ICD-10-CM | POA: Diagnosis present

## 2015-06-14 DIAGNOSIS — J438 Other emphysema: Secondary | ICD-10-CM | POA: Diagnosis not present

## 2015-06-14 DIAGNOSIS — E119 Type 2 diabetes mellitus without complications: Secondary | ICD-10-CM | POA: Diagnosis present

## 2015-06-14 DIAGNOSIS — I509 Heart failure, unspecified: Secondary | ICD-10-CM | POA: Diagnosis not present

## 2015-06-14 DIAGNOSIS — E039 Hypothyroidism, unspecified: Secondary | ICD-10-CM | POA: Diagnosis present

## 2015-06-14 DIAGNOSIS — M1389 Other specified arthritis, multiple sites: Secondary | ICD-10-CM | POA: Diagnosis present

## 2015-06-14 DIAGNOSIS — Z7902 Long term (current) use of antithrombotics/antiplatelets: Secondary | ICD-10-CM | POA: Diagnosis not present

## 2015-06-14 DIAGNOSIS — E1165 Type 2 diabetes mellitus with hyperglycemia: Secondary | ICD-10-CM | POA: Diagnosis present

## 2015-06-14 DIAGNOSIS — E1140 Type 2 diabetes mellitus with diabetic neuropathy, unspecified: Secondary | ICD-10-CM | POA: Diagnosis present

## 2015-06-14 DIAGNOSIS — Z7982 Long term (current) use of aspirin: Secondary | ICD-10-CM

## 2015-06-14 DIAGNOSIS — E785 Hyperlipidemia, unspecified: Secondary | ICD-10-CM | POA: Diagnosis present

## 2015-06-14 DIAGNOSIS — Z794 Long term (current) use of insulin: Secondary | ICD-10-CM

## 2015-06-14 DIAGNOSIS — E1159 Type 2 diabetes mellitus with other circulatory complications: Secondary | ICD-10-CM | POA: Diagnosis not present

## 2015-06-14 DIAGNOSIS — K7030 Alcoholic cirrhosis of liver without ascites: Secondary | ICD-10-CM | POA: Diagnosis present

## 2015-06-14 DIAGNOSIS — I5043 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) and diastolic (congestive) heart failure: Principal | ICD-10-CM | POA: Diagnosis present

## 2015-06-14 DIAGNOSIS — F17210 Nicotine dependence, cigarettes, uncomplicated: Secondary | ICD-10-CM | POA: Diagnosis present

## 2015-06-14 DIAGNOSIS — K7031 Alcoholic cirrhosis of liver with ascites: Secondary | ICD-10-CM | POA: Diagnosis not present

## 2015-06-14 LAB — CBC WITH DIFFERENTIAL/PLATELET
Basophils Absolute: 0 10*3/uL (ref 0.0–0.1)
Basophils Relative: 0 % (ref 0–1)
EOS ABS: 0.1 10*3/uL (ref 0.0–0.7)
EOS PCT: 1 % (ref 0–5)
HEMATOCRIT: 31.4 % — AB (ref 36.0–46.0)
HEMOGLOBIN: 10.1 g/dL — AB (ref 12.0–15.0)
LYMPHS ABS: 1.7 10*3/uL (ref 0.7–4.0)
LYMPHS PCT: 11 % — AB (ref 12–46)
MCH: 28.2 pg (ref 26.0–34.0)
MCHC: 32.2 g/dL (ref 30.0–36.0)
MCV: 87.7 fL (ref 78.0–100.0)
MONOS PCT: 5 % (ref 3–12)
Monocytes Absolute: 0.8 10*3/uL (ref 0.1–1.0)
Neutro Abs: 13.1 10*3/uL — ABNORMAL HIGH (ref 1.7–7.7)
Neutrophils Relative %: 83 % — ABNORMAL HIGH (ref 43–77)
Platelets: 286 10*3/uL (ref 150–400)
RBC: 3.58 MIL/uL — AB (ref 3.87–5.11)
RDW: 15 % (ref 11.5–15.5)
WBC: 15.8 10*3/uL — AB (ref 4.0–10.5)

## 2015-06-14 LAB — BASIC METABOLIC PANEL
ANION GAP: 10 (ref 5–15)
BUN: 16 mg/dL (ref 6–20)
CHLORIDE: 104 mmol/L (ref 101–111)
CO2: 23 mmol/L (ref 22–32)
Calcium: 9.2 mg/dL (ref 8.9–10.3)
Creatinine, Ser: 0.83 mg/dL (ref 0.44–1.00)
GFR calc Af Amer: >60 mL/min (ref >60)
GFR calc non Af Amer: >60 mL/min (ref >60)
Glucose, Bld: 289 mg/dL — ABNORMAL HIGH (ref 65–99)
Potassium: 4.3 mmol/L (ref 3.5–5.1)
Sodium: 137 mmol/L (ref 135–145)

## 2015-06-14 LAB — RAPID URINE DRUG SCREEN, HOSP PERFORMED
Amphetamines: NOT DETECTED
BARBITURATES: NOT DETECTED
Benzodiazepines: NOT DETECTED
Cocaine: NOT DETECTED
Opiates: NOT DETECTED
Tetrahydrocannabinol: NOT DETECTED

## 2015-06-14 LAB — GLUCOSE, CAPILLARY
GLUCOSE-CAPILLARY: 336 mg/dL — AB (ref 65–99)
Glucose-Capillary: 230 mg/dL — ABNORMAL HIGH (ref 65–99)
Glucose-Capillary: 380 mg/dL — ABNORMAL HIGH (ref 65–99)
Glucose-Capillary: 430 mg/dL — ABNORMAL HIGH (ref 65–99)

## 2015-06-14 LAB — I-STAT TROPONIN, ED: Troponin i, poc: 0 ng/mL (ref 0.00–0.08)

## 2015-06-14 LAB — BRAIN NATRIURETIC PEPTIDE: B Natriuretic Peptide: 1044.2 pg/mL — ABNORMAL HIGH (ref 0.0–100.0)

## 2015-06-14 LAB — ETHANOL

## 2015-06-14 MED ORDER — FUROSEMIDE 10 MG/ML IJ SOLN
40.0000 mg | Freq: Two times a day (BID) | INTRAMUSCULAR | Status: DC
  Administered 2015-06-14 – 2015-06-17 (×8): 40 mg via INTRAVENOUS
  Filled 2015-06-14 (×11): qty 4

## 2015-06-14 MED ORDER — METHYLPREDNISOLONE SODIUM SUCC 125 MG IJ SOLR
125.0000 mg | Freq: Once | INTRAMUSCULAR | Status: AC
Start: 2015-06-14 — End: 2015-06-14
  Administered 2015-06-14: 125 mg via INTRAVENOUS
  Filled 2015-06-14: qty 2

## 2015-06-14 MED ORDER — INSULIN ASPART 100 UNIT/ML ~~LOC~~ SOLN: 0.0000 [IU] | Freq: Three times a day (TID) | SUBCUTANEOUS | Status: DC

## 2015-06-14 MED ORDER — CLOPIDOGREL BISULFATE 75 MG PO TABS
75.0000 mg | ORAL_TABLET | Freq: Every day | ORAL | Status: DC
Start: 2015-06-15 — End: 2015-06-19
  Administered 2015-06-15 – 2015-06-19 (×5): 75 mg via ORAL
  Filled 2015-06-14 (×6): qty 1

## 2015-06-14 MED ORDER — FUROSEMIDE 10 MG/ML IJ SOLN
80.0000 mg | Freq: Once | INTRAMUSCULAR | Status: AC
  Administered 2015-06-14: 80 mg via INTRAVENOUS
  Filled 2015-06-14: qty 8

## 2015-06-14 MED ORDER — GUAIFENESIN-DM 100-10 MG/5ML PO SYRP: 5.0000 mL | ORAL_SOLUTION | ORAL | Status: DC | PRN

## 2015-06-14 MED ORDER — HYDROXYZINE HCL 50 MG PO TABS
50.0000 mg | ORAL_TABLET | Freq: Every day | ORAL | Status: DC
  Administered 2015-06-14 – 2015-06-18 (×5): 50 mg via ORAL
  Filled 2015-06-14 (×7): qty 1

## 2015-06-14 MED ORDER — PANTOPRAZOLE SODIUM 40 MG PO TBEC
40.0000 mg | DELAYED_RELEASE_TABLET | Freq: Every day | ORAL | Status: DC
  Administered 2015-06-14 – 2015-06-19 (×6): 40 mg via ORAL
  Filled 2015-06-14 (×6): qty 1

## 2015-06-14 MED ORDER — LEVALBUTEROL HCL 0.63 MG/3ML IN NEBU: 0.6300 mg | INHALATION_SOLUTION | Freq: Four times a day (QID) | RESPIRATORY_TRACT | Status: DC | PRN

## 2015-06-14 MED ORDER — ONDANSETRON HCL 4 MG PO TABS
4.0000 mg | ORAL_TABLET | Freq: Four times a day (QID) | ORAL | Status: DC | PRN
  Administered 2015-06-17: 4 mg via ORAL
  Filled 2015-06-14: qty 1

## 2015-06-14 MED ORDER — GABAPENTIN 400 MG PO CAPS
400.0000 mg | ORAL_CAPSULE | Freq: Four times a day (QID) | ORAL | Status: DC
  Administered 2015-06-14 – 2015-06-19 (×22): 400 mg via ORAL
  Filled 2015-06-14 (×24): qty 1

## 2015-06-14 MED ORDER — LEVALBUTEROL HCL 0.63 MG/3ML IN NEBU
0.6300 mg | INHALATION_SOLUTION | Freq: Four times a day (QID) | RESPIRATORY_TRACT | Status: DC
  Administered 2015-06-14: 0.63 mg via RESPIRATORY_TRACT
  Filled 2015-06-14 (×2): qty 3

## 2015-06-14 MED ORDER — SODIUM CHLORIDE 0.9 % IJ SOLN
3.0000 mL | Freq: Two times a day (BID) | INTRAMUSCULAR | Status: DC
  Administered 2015-06-14 – 2015-06-18 (×8): 3 mL via INTRAVENOUS
  Administered 2015-06-18: 10 mL via INTRAVENOUS
  Administered 2015-06-19: 3 mL via INTRAVENOUS

## 2015-06-14 MED ORDER — LEVOTHYROXINE SODIUM 50 MCG PO TABS
50.0000 ug | ORAL_TABLET | Freq: Every day | ORAL | Status: DC
  Administered 2015-06-14 – 2015-06-19 (×6): 50 ug via ORAL
  Filled 2015-06-14 (×7): qty 1

## 2015-06-14 MED ORDER — PRAVASTATIN SODIUM 80 MG PO TABS
80.0000 mg | ORAL_TABLET | Freq: Every day | ORAL | Status: DC
  Administered 2015-06-14 – 2015-06-18 (×5): 80 mg via ORAL
  Filled 2015-06-14 (×6): qty 1

## 2015-06-14 MED ORDER — INSULIN GLARGINE 100 UNIT/ML ~~LOC~~ SOLN
40.0000 [IU] | Freq: Every day | SUBCUTANEOUS | Status: DC
  Administered 2015-06-14 – 2015-06-18 (×5): 40 [IU] via SUBCUTANEOUS
  Filled 2015-06-14 (×6): qty 0.4

## 2015-06-14 MED ORDER — LISINOPRIL 20 MG PO TABS
20.0000 mg | ORAL_TABLET | Freq: Every day | ORAL | Status: DC
  Administered 2015-06-14 – 2015-06-19 (×6): 20 mg via ORAL
  Filled 2015-06-14 (×6): qty 1

## 2015-06-14 MED ORDER — CARVEDILOL 6.25 MG PO TABS
6.2500 mg | ORAL_TABLET | Freq: Two times a day (BID) | ORAL | Status: DC
  Administered 2015-06-14 – 2015-06-19 (×11): 6.25 mg via ORAL
  Filled 2015-06-14 (×13): qty 1

## 2015-06-14 MED ORDER — INSULIN ASPART 100 UNIT/ML ~~LOC~~ SOLN
0.0000 [IU] | Freq: Three times a day (TID) | SUBCUTANEOUS | Status: DC
  Administered 2015-06-15 (×2): 20 [IU] via SUBCUTANEOUS
  Administered 2015-06-16 (×2): 7 [IU] via SUBCUTANEOUS
  Administered 2015-06-17: 3 [IU] via SUBCUTANEOUS
  Administered 2015-06-17: 7 [IU] via SUBCUTANEOUS
  Administered 2015-06-17: 4 [IU] via SUBCUTANEOUS
  Administered 2015-06-18: 15 [IU] via SUBCUTANEOUS
  Administered 2015-06-18: 11 [IU] via SUBCUTANEOUS
  Administered 2015-06-18: 4 [IU] via SUBCUTANEOUS
  Administered 2015-06-19: 15 [IU] via SUBCUTANEOUS
  Administered 2015-06-19: 4 [IU] via SUBCUTANEOUS

## 2015-06-14 MED ORDER — ASPIRIN EC 81 MG PO TBEC
81.0000 mg | DELAYED_RELEASE_TABLET | Freq: Every day | ORAL | Status: DC
  Administered 2015-06-14 – 2015-06-19 (×5): 81 mg via ORAL
  Filled 2015-06-14 (×6): qty 1

## 2015-06-14 MED ORDER — ENOXAPARIN SODIUM 40 MG/0.4ML ~~LOC~~ SOLN
40.0000 mg | SUBCUTANEOUS | Status: DC
  Administered 2015-06-14 – 2015-06-18 (×5): 40 mg via SUBCUTANEOUS
  Filled 2015-06-14 (×6): qty 0.4

## 2015-06-14 MED ORDER — ONDANSETRON HCL 4 MG/2ML IJ SOLN
4.0000 mg | Freq: Four times a day (QID) | INTRAMUSCULAR | Status: DC | PRN
Start: 2015-06-14 — End: 2015-06-19
  Administered 2015-06-14: 4 mg via INTRAVENOUS
  Filled 2015-06-14: qty 2

## 2015-06-14 MED ORDER — GUAIFENESIN ER 600 MG PO TB12
600.0000 mg | ORAL_TABLET | Freq: Two times a day (BID) | ORAL | Status: DC
  Administered 2015-06-14 – 2015-06-19 (×11): 600 mg via ORAL
  Filled 2015-06-14 (×12): qty 1

## 2015-06-14 MED ORDER — METHYLPREDNISOLONE SODIUM SUCC 125 MG IJ SOLR
60.0000 mg | Freq: Four times a day (QID) | INTRAMUSCULAR | Status: DC
  Administered 2015-06-14 – 2015-06-15 (×4): 60 mg via INTRAVENOUS
  Filled 2015-06-14 (×8): qty 0.96

## 2015-06-14 MED ORDER — LEVALBUTEROL HCL 0.63 MG/3ML IN NEBU
0.6300 mg | INHALATION_SOLUTION | Freq: Three times a day (TID) | RESPIRATORY_TRACT | Status: DC
  Administered 2015-06-14 – 2015-06-19 (×14): 0.63 mg via RESPIRATORY_TRACT
  Filled 2015-06-14 (×25): qty 3

## 2015-06-14 NOTE — Care Management Note (Addendum)
Case Management Note  Patient Details  Name: [name] MRN: [id number] Date of Birth: 1961-07-03  Subjective/Objective:                 Admitted with dyspnea, hx of  HTN, HLD, COPD, chronic combined diastolic and systolic CHF with last known EF 40%, DM type II, a-fib.   Action/Plan: Return to home when medically stable. CM to f/u with d/c needs.  Expected Discharge Date:                  Expected Discharge Plan:  Home/Self Care  In-House Referral:     Discharge planning Services  CM Consult  Post Acute Care Choice:    Choice offered to:     DME Arranged:    DME Agency:     HH Arranged:    HH Agency:     Status of Service:  In process, will continue to follow  Medicare Important Message Given:    Date Medicare IM Given:    Medicare IM give by:    Date Additional Medicare IM Given:    Additional Medicare Important Message give by:     If discussed at [place] of Stay Meetings, dates discussed:    Additional Comments:  Pt states unable to afford medication, recently learned medicaid had ended however new application in process for eligibility. Referral to financial counselor placed by CM. CM to f/u regarding medication assistance @ d/c.    Emergency contact : Dorann Lodge (Sister) [phone]  Sharin Mons, RN 06/14/2015, 10:55 AM

## 2015-06-14 NOTE — ED Notes (Signed)
Pt placed in a gown and hooked up to the monitor with the 5 lead, BP cuff and pulse ox 

## 2015-06-14 NOTE — H&P (Addendum)
[facility] History and Physical  [name] [id number] DOB: 11/24/1960 DOA: 06/14/2015  Referring physician: ED physician, [name] PCP: Karla Sa [name], MD   Chief Complaint: dyspnea  HPI:  Pt is 54 yo female with complicated medical conditions including HTN, HLD, COPD, chronic combined diastolic and systolic CHF with last known EF 40%, DM type II, a-fib but has stopped AC on her own due to cost issues and intolerance, CAD on aspirin and plavix, follows with [name]. She now presented to [facility] ED with main concern of several days duration of progressive dyspnea taht initially started with exertion and has progressed to dyspnea at rest in the past 24 hours. This has been associated with LE swelling 3 pillow orthopnea (typically sleeps with two pillows), wheezing, chest tightness that occurs with coughing spells only. Her cough is chronic and mostly non productive. She denies fevers, chills, abd or urinary concerns.  In ED, pt is hemodynamically stable, VSS except initially mild tachypnea. CXR suggestive of pulmonary edema and [facility] asked to admit for further evaluation. Please note that last admission was in June (pt discharged June 19th, 2016 and weight on d/c was 220 lbs).   Assessment and Plan: Principal Problem:   Acute respiratory failure - secondary to acute on chronic combined CHF and acute COPD - admit to telemetry unit and treat with lasix for CHF (see below) - for COPD, continue BD's scheduled and as needed, add solumedrol   Active Problems:   Acute on chronic combined systolic and diastolic heart failure - last 2 D ECHO in 10/2014 with EF 40 - 45%  - weight is up on this admission from last known weights on June 19th, 2016 220 lbs - will place on Lasix 40 mg IV BID and will need to monitor clinical response - daily weights, strict I/O - weight on admission today 234 lbs    Hx of PSA including alcohol, tobacco, THC, cocaine  - denies any recent use - will check  alcohol level, UDS - cessation consultation on smoking provided as pt reports ongoing cigarette smoking     Atrial fibrillation with RVR, CHADS vasc score at least 5 - has stopped AC on her own - will need to address with her cardiologist as I'm not sure he is aware  - she does not want to resume Xarelto due to nausea and vomiting - risks of stroke and thromboembolic events discussed with pt and she verbalized understanding     Carotid arterial disease - continue aspirin and plavix    Cigarette smoker - continues to smoke - provide nicotine patch if pt wants - cessation consultation provided     COPD (chronic obstructive pulmonary disease) - acute exacerbation - place on solumedrol and taper down as clinically indicated - also place on BD's scheduled and as needed     Anemia of chronic disease - no signs of bleeding - repeat CBC In AM    Morbid obesity  - Body mass index is 40.17 kg/(m^2).    DVT prophylaxis  - Lovenox SQ  Radiological Exams on Admission: Dg Chest 2 View 06/14/2015   Stable borderline cardiomegaly and interstitial prominence ? pulmonary edema. Small right pleural effusion, new.    Code Status: Full Family Communication: Pt at bedside Disposition Plan: Admit for further evaluation    [name] [id number]  Review of Systems:  Constitutional: Negative for diaphoresis.  HENT: Negative for hearing loss, ear pain, nosebleeds, congestion, sore throat, neck pain, tinnitus and ear discharge.  Eyes: Negative for blurred vision, double vision, photophobia, pain, discharge and redness.  Respiratory: Per hPI .   Cardiovascular: Negative for  Palpitations, claudication.  Gastrointestinal: Negative for nausea, vomiting and abdominal pain.  Genitourinary: Negative for dysuria, urgency, frequency, hematuria and flank pain.  Musculoskeletal: Negative for myalgias, back pain, joint pain and falls.  Skin: Negative for itching and rash.  Neurological: Negative  for dizziness and weakness.  Endo/Heme/Allergies: Negative for environmental allergies and polydipsia. Does not bruise/bleed easily.  Psychiatric/Behavioral: Negative for suicidal ideas. The patient is not nervous/anxious.      Past Medical History  Diagnosis Date  . Alcohol abuse   . Narcotic abuse   . Cocaine abuse   . Marijuana abuse   . Tobacco abuse   . Alcoholic cirrhosis   . Cardiomyopathy   . Obesity   . CHF (congestive heart failure)   . Hypothyroidism   . GERD (gastroesophageal reflux disease)   . Atrial fibrillation   . Critical lower limb ischemia   . Hypertension   . CAD (coronary artery disease)     a. cath 11/10/2014 small vessel CAD. Demand ischemia in the setting of rapid a-fib  . Pneumonia     END MARCH 2016  . Anxiety   . Wrist fracture     LEFT; "just did xray and cast"  . DOE (dyspnea on exertion) 04/29/2015  . Poorly controlled type 2 diabetes mellitus   . Hyperlipemia   . Peripheral arterial disease     a. 01/2015 Angio/PTA: LSFA 100 w/ recon @ adductor canal and 1 vessel runoff via AT, RSFA 99 (atherectomy/pta) - 1 vessel runoff via diff dzs peroneal.  . Carotid artery disease     a. 01/2015 Carotid Angio: RICA [id number], LICA [id number].  [name] COPD (chronic obstructive pulmonary disease)   . Arthritis     "hands, arms, shoulders, legs, back" (04/29/2015)  . Diabetic peripheral neuropathy     Past Surgical History  Procedure Laterality Date  . Cardioversion  ~ 02/2013    "twice"   . Lower extremity angiogram N/A 09/10/2013    Procedure: LOWER EXTREMITY ANGIOGRAM;  Surgeon: [name], MD;  Location: [facility] CATH LAB;  Service: Cardiovascular;  Laterality: N/A;  . Left heart catheterization with coronary angiogram N/A 10/31/2014    Procedure: LEFT HEART CATHETERIZATION WITH CORONARY ANGIOGRAM;  Surgeon: Burnell Blanks, MD;  Location: [facility] CATH LAB;  Service: Cardiovascular;  Laterality: N/A;  . Peripheral athrectomy Right 01/15/2015    SFA/notes 01/15/2015  .  Balloon angioplasty, artery Right 01/15/2015    SFA/notes 01/15/2015  . Cardiac catheterization    . Lower extremity angiogram N/A 01/15/2015    Procedure: LOWER EXTREMITY ANGIOGRAM;  Surgeon: [name], MD;  Location: [facility] CATH LAB;  Service: Cardiovascular;  Laterality: N/A;  . Carotid angiogram N/A 01/15/2015    Procedure: CAROTID ANGIOGRAM;  Surgeon: [name], MD;  Location: [facility] CATH LAB;  Service: Cardiovascular;  Laterality: N/A;  . Endarterectomy Left 02/19/2015    Procedure: LEFT CAROTID ENDARTERECTOMY ;  Surgeon: Serafina Mitchell, MD;  Location: [place];  Service: Vascular;  Laterality: Left;  . Dilation and curettage of uterus  1988    Social History:  reports that she has been smoking Cigarettes.  She has a 40 pack-year smoking history. She has never used smokeless tobacco. She reports that she drinks alcohol. She reports that she uses illicit drugs ("Crack" cocaine and Marijuana).  Allergies  Allergen Reactions  . Amiodarone Nausea And  Vomiting    Family History  Problem Relation Age of Onset  . Hypertension Mother   . Diabetes Mother   . Cancer Mother     breast, ovarian, colon  . Clotting disorder Mother   . Heart disease Mother   . Heart attack Mother   . Hypertension Father   . Heart disease Father   . Emphysema Sister     smoked    Prior to Admission medications   Medication Sig Start Date End Date Taking? Authorizing Provider  albuterol (PROVENTIL) (2.5 MG/3ML) 0.083% nebulizer solution Take 2.5 mg by nebulization every 6 (six) hours as needed. For wheezing.   Yes Historical Provider, MD  aspirin 81 MG tablet Take 81 mg by mouth daily.   Yes Historical Provider, MD  carvedilol (COREG) 6.25 MG tablet Take 1 tablet (6.25 mg total) by mouth 2 (two) times daily with a meal. 02/05/15  Yes [name], MD  clopidogrel (PLAVIX) 75 MG tablet Take 1 tablet (75 mg total) by mouth daily with breakfast. 01/16/15  Yes Rogelia Mire, NP  furosemide (LASIX) 40 MG  tablet Take 1.5 tablets (60 mg total) by mouth 2 (two) times daily. 05/03/15  Yes Nishant Dhungel, MD  gabapentin (NEURONTIN) 400 MG capsule Take 400 mg by mouth 4 (four) times daily.   Yes Historical Provider, MD  hydrOXYzine (ATARAX/VISTARIL) 50 MG tablet Take 50 mg by mouth at bedtime. For sleep   Yes Historical Provider, MD  insulin aspart (NOVOLOG) 100 UNIT/ML injection Inject 7-10 Units into the skin 3 (three) times daily with meals. Sliding scale, usually takes 10 units in am, 7 units in pm   Yes Historical Provider, MD  insulin glargine (LANTUS) 100 UNIT/ML injection Inject 0.4 mLs (40 Units total) into the skin at bedtime. 05/03/15  Yes Nishant Dhungel, MD  ipratropium (ATROVENT HFA) 17 MCG/ACT inhaler Inhale 2 puffs into the lungs 2 (two) times daily.    Yes Historical Provider, MD  levothyroxine (SYNTHROID, LEVOTHROID) 50 MCG tablet Take 50 mcg by mouth daily.   Yes Historical Provider, MD  lisinopril (PRINIVIL,ZESTRIL) 20 MG tablet Take 20 mg by mouth daily.   Yes Historical Provider, MD  metFORMIN (GLUCOPHAGE) 1000 MG tablet Take 1,000 mg by mouth 2 (two) times daily with a meal.   Yes Historical Provider, MD  pantoprazole (PROTONIX) 40 MG tablet Take 40 mg by mouth daily. 02/13/15 02/13/16 Yes Historical Provider, MD  pravastatin (PRAVACHOL) 80 MG tablet Take 1 tablet (80 mg total) by mouth daily at 6 PM. 04/01/15  Yes [name], MD  PRESCRIPTION MEDICATION Apply 1 application topically daily.   Yes Historical Provider, MD  rivaroxaban (XARELTO) 20 MG TABS tablet Take 20 mg by mouth daily with supper.   Yes Historical Provider, MD  nitroGLYCERIN (NITROSTAT) 0.4 MG SL tablet Place 1 tablet (0.4 mg total) under the tongue every 5 (five) minutes as needed for chest pain. 01/16/15   Rogelia Mire, NP  oxyCODONE (ROXICODONE) 5 MG immediate release tablet Take 1 tablet (5 mg total) by mouth every 6 (six) hours as needed. Patient not taking: Reported on 04/29/2015 02/21/15   Alvia Grove,  PA-C    Physical Exam: Filed Vitals:   06/14/15 0530 06/14/15 0615 06/14/15 0645 06/14/15 0700  BP: 172/82 167/83 157/90 162/89  Pulse: 102 103 106 99  Temp:      TempSrc:      Resp: 32     Height:      Weight:  SpO2: 92% 94% 96% 96%    Physical Exam  Constitutional: Appears well-developed and well-nourished. No distress.  HENT: Normocephalic. External right and left ear normal. Oropharynx is clear and moist.  Eyes: Conjunctivae and EOM are normal. PERRLA, no scleral icterus.  Neck: Normal ROM. Neck supple. No JVD. No tracheal deviation. No thyromegaly.  CVS: IRRR, S1/S2 +,  no gallops, no carotid bruit.  Pulmonary: Bilateral rhonchi with exp wheezing and bibasilar crackles  Abdominal: Soft. BS +,  no distension, tenderness, rebound or guarding.  Musculoskeletal: Normal range of motion. LE +2 bilateral pitting edema  Lymphadenopathy: No lymphadenopathy noted, cervical, inguinal. Neuro: Alert. Normal reflexes, muscle tone coordination. No cranial nerve deficit. Skin: Skin is warm and dry. No rash noted. Not diaphoretic. No erythema. No pallor.  Psychiatric: Normal mood and affect. Behavior, judgment, thought content normal.   Labs on Admission:  Basic Metabolic Panel:  Recent Labs Lab 06/14/15 0500  NA 137  K 4.3  CL 104  CO2 23  GLUCOSE 289*  BUN 16  CREATININE 0.83  CALCIUM 9.2   CBC:  Recent Labs Lab 06/14/15 0500  WBC 15.8*  NEUTROABS 13.1*  HGB 10.1*  HCT 31.4*  MCV 87.7  PLT 286   EKG: no ST/T wave changes  If 7PM-7AM, please contact night-coverage [email or web address] Password [facility] 06/14/2015, 7:35 AM

## 2015-06-14 NOTE — ED Notes (Signed)
Patient transported to X-ray 

## 2015-06-14 NOTE — ED Notes (Signed)
Pt states she started having SOB 2 days ago; pt states she has had nausea; Pt states hx of COPD, CHF, and DM; Pt is whined on arrival; Pt has bilateral leg swelling; Pt states 3 days ago she ran out of lasix. Pt a&o x 4 on arrival; Pt c/o bilateral leg pain at 10/10

## 2015-06-14 NOTE — Progress Notes (Signed)
CSW received order to assist patient with inability to afford medications.  CSW notified weekend [facility] of this order to provide assistance as indicated.  CSW will sign off but will be available for any CSW needs. Thanks!  Lorie Phenix. Pauline Good, [place] (weekend coverage)

## 2015-06-14 NOTE — ED Provider Notes (Signed)
CSN: [id number]     Arrival date & time 06/14/15  [id number] History   First MD Initiated Contact with Patient 06/14/15 0445     Chief Complaint  Patient presents with  . Shortness of Breath     (Consider location/radiation/quality/duration/timing/severity/associated sxs/prior Treatment) Patient is a 54 y.o. female presenting with shortness of breath.  Shortness of Breath Severity:  Moderate Onset quality:  Gradual Duration:  4 days Timing:  Constant Progression:  Worsening Chronicity:  Recurrent Context comment:  Ran out of all home medications Relieved by:  Nothing Worsened by:  Deep breathing (laying flat) Associated symptoms: cough   Associated symptoms: no abdominal pain, no chest pain, no fever and no vomiting     Past Medical History  Diagnosis Date  . Alcohol abuse   . Narcotic abuse   . Cocaine abuse   . Marijuana abuse   . Tobacco abuse   . Alcoholic cirrhosis   . Cardiomyopathy   . Obesity   . CHF (congestive heart failure)   . Hypothyroidism   . GERD (gastroesophageal reflux disease)   . Atrial fibrillation   . Critical lower limb ischemia   . Hypertension   . CAD (coronary artery disease)     a. cath 11/10/2014 small vessel CAD. Demand ischemia in the setting of rapid a-fib  . Pneumonia     END MARCH 2016  . Anxiety   . Wrist fracture     LEFT; "just did xray and cast"  . DOE (dyspnea on exertion) 04/29/2015  . Poorly controlled type 2 diabetes mellitus   . Hyperlipemia   . Peripheral arterial disease     a. 01/2015 Angio/PTA: LSFA 100 w/ recon @ adductor canal and 1 vessel runoff via AT, RSFA 99 (atherectomy/pta) - 1 vessel runoff via diff dzs peroneal.  . Carotid artery disease     a. 01/2015 Carotid Angio: RICA [id number], LICA [id number].  [name] COPD (chronic obstructive pulmonary disease)   . Arthritis     "hands, arms, shoulders, legs, back" (04/29/2015)  . Diabetic peripheral neuropathy    Past Surgical History  Procedure Laterality Date  . Cardioversion  ~  02/2013    "twice"   . Lower extremity angiogram N/A 09/10/2013    Procedure: LOWER EXTREMITY ANGIOGRAM;  Surgeon: [name], MD;  Location: [facility] CATH LAB;  Service: Cardiovascular;  Laterality: N/A;  . Left heart catheterization with coronary angiogram N/A 10/31/2014    Procedure: LEFT HEART CATHETERIZATION WITH CORONARY ANGIOGRAM;  Surgeon: Burnell Blanks, MD;  Location: [facility] CATH LAB;  Service: Cardiovascular;  Laterality: N/A;  . Peripheral athrectomy Right 01/15/2015    SFA/notes 01/15/2015  . Balloon angioplasty, artery Right 01/15/2015    SFA/notes 01/15/2015  . Cardiac catheterization    . Lower extremity angiogram N/A 01/15/2015    Procedure: LOWER EXTREMITY ANGIOGRAM;  Surgeon: [name], MD;  Location: [facility] CATH LAB;  Service: Cardiovascular;  Laterality: N/A;  . Carotid angiogram N/A 01/15/2015    Procedure: CAROTID ANGIOGRAM;  Surgeon: [name], MD;  Location: [facility] CATH LAB;  Service: Cardiovascular;  Laterality: N/A;  . Endarterectomy Left 02/19/2015    Procedure: LEFT CAROTID ENDARTERECTOMY ;  Surgeon: Serafina Mitchell, MD;  Location: [facility] OR;  Service: Vascular;  Laterality: Left;  . Dilation and curettage of uterus  1988   Family History  Problem Relation Age of Onset  . Hypertension Mother   . Diabetes Mother   . Cancer Mother     breast, ovarian,  colon  . Clotting disorder Mother   . Heart disease Mother   . Heart attack Mother   . Hypertension Father   . Heart disease Father   . Emphysema Sister     smoked   History  Substance Use Topics  . Smoking status: Current Every Day Smoker -- 1.00 packs/day for 40 years    Types: Cigarettes    Last Attempt to Quit: 02/19/2015  . Smokeless tobacco: Never Used  . Alcohol Use: 0.0 oz/week    0 Standard drinks or equivalent per week     Comment: 04/29/2015 "might have a mixed drink maybe once/month, or less"   OB History    Gravida Para Term Preterm AB TAB SAB Ectopic Multiple Living   1 1 1       1       Review of Systems  Constitutional: Negative for fever.  Respiratory: Positive for cough and shortness of breath.   Cardiovascular: Negative for chest pain.  Gastrointestinal: Negative for vomiting and abdominal pain.  All other systems reviewed and are negative.     Allergies  Amiodarone  Home Medications   Prior to Admission medications   Medication Sig Start Date End Date Taking? Authorizing Provider  albuterol (PROVENTIL) (2.5 MG/3ML) 0.083% nebulizer solution Take 2.5 mg by nebulization every 6 (six) hours as needed. For wheezing.   Yes Historical Provider, MD  aspirin 81 MG tablet Take 81 mg by mouth daily.   Yes Historical Provider, MD  carvedilol (COREG) 6.25 MG tablet Take 1 tablet (6.25 mg total) by mouth 2 (two) times daily with a meal. 02/05/15  Yes [name], MD  clopidogrel (PLAVIX) 75 MG tablet Take 1 tablet (75 mg total) by mouth daily with breakfast. 01/16/15  Yes Rogelia Mire, NP  furosemide (LASIX) 40 MG tablet Take 1.5 tablets (60 mg total) by mouth 2 (two) times daily. 05/03/15  Yes Nishant Dhungel, MD  gabapentin (NEURONTIN) 400 MG capsule Take 400 mg by mouth 4 (four) times daily.   Yes Historical Provider, MD  hydrOXYzine (ATARAX/VISTARIL) 50 MG tablet Take 50 mg by mouth at bedtime. For sleep   Yes Historical Provider, MD  insulin aspart (NOVOLOG) 100 UNIT/ML injection Inject 7-10 Units into the skin 3 (three) times daily with meals. Sliding scale, usually takes 10 units in am, 7 units in pm   Yes Historical Provider, MD  insulin glargine (LANTUS) 100 UNIT/ML injection Inject 0.4 mLs (40 Units total) into the skin at bedtime. 05/03/15  Yes Nishant Dhungel, MD  ipratropium (ATROVENT HFA) 17 MCG/ACT inhaler Inhale 2 puffs into the lungs 2 (two) times daily.    Yes Historical Provider, MD  levothyroxine (SYNTHROID, LEVOTHROID) 50 MCG tablet Take 50 mcg by mouth daily.   Yes Historical Provider, MD  lisinopril (PRINIVIL,ZESTRIL) 20 MG tablet Take 20 mg by  mouth daily.   Yes Historical Provider, MD  metFORMIN (GLUCOPHAGE) 1000 MG tablet Take 1,000 mg by mouth 2 (two) times daily with a meal.   Yes Historical Provider, MD  pantoprazole (PROTONIX) 40 MG tablet Take 40 mg by mouth daily. 02/13/15 02/13/16 Yes Historical Provider, MD  pravastatin (PRAVACHOL) 80 MG tablet Take 1 tablet (80 mg total) by mouth daily at 6 PM. 04/01/15  Yes [name], MD  PRESCRIPTION MEDICATION Apply 1 application topically daily.   Yes Historical Provider, MD  rivaroxaban (XARELTO) 20 MG TABS tablet Take 20 mg by mouth daily with supper.   Yes Historical Provider, MD  nitroGLYCERIN (  NITROSTAT) 0.4 MG SL tablet Place 1 tablet (0.4 mg total) under the tongue every 5 (five) minutes as needed for chest pain. 01/16/15   Rogelia Mire, NP  oxyCODONE (ROXICODONE) 5 MG immediate release tablet Take 1 tablet (5 mg total) by mouth every 6 (six) hours as needed. Patient not taking: Reported on 04/29/2015 02/21/15   Joelene Millin A Trinh, PA-C   BP 147/75 mmHg  Pulse 84  Temp(Src) 98 F (36.7 C) (Oral)  Resp 16  Ht 5\' 4"  (1.626 m)  Wt 234 lb 2 oz (106.198 kg)  BMI 40.17 kg/m2  SpO2 97%  LMP 11/27/2012 Physical Exam  Constitutional: She is oriented to person, place, and time. She appears well-developed and well-nourished.  HENT:  Head: Normocephalic and atraumatic.  Right Ear: External ear normal.  Left Ear: External ear normal.  Eyes: Conjunctivae and EOM are normal. Pupils are equal, round, and reactive to light.  Neck: Normal range of motion. Neck supple.  Cardiovascular: Normal rate, regular rhythm, normal heart sounds and intact distal pulses.   Pulmonary/Chest: Effort normal. She has rales in the right lower field and the left lower field.  Abdominal: Soft. Bowel sounds are normal. There is no tenderness.  Musculoskeletal: Normal range of motion.       Right lower leg: She exhibits edema.       Left lower leg: She exhibits edema.  Neurological: She is alert and  oriented to person, place, and time.  Skin: Skin is warm and dry.  Vitals reviewed.   ED Course  Procedures (including critical care time) Labs Review Labs Reviewed  BASIC METABOLIC PANEL - Abnormal; Notable for the following:    Glucose, Bld 289 (*)    All other components within normal limits  CBC WITH DIFFERENTIAL/PLATELET - Abnormal; Notable for the following:    WBC 15.8 (*)    RBC 3.58 (*)    Hemoglobin 10.1 (*)    HCT 31.4 (*)    Neutrophils Relative % 83 (*)    Neutro Abs 13.1 (*)    Lymphocytes Relative 11 (*)    All other components within normal limits  BRAIN NATRIURETIC PEPTIDE - Abnormal; Notable for the following:    B Natriuretic Peptide 1044.2 (*)    All other components within normal limits  GLUCOSE, CAPILLARY - Abnormal; Notable for the following:    Glucose-Capillary 230 (*)    All other components within normal limits  GLUCOSE, CAPILLARY - Abnormal; Notable for the following:    Glucose-Capillary 380 (*)    All other components within normal limits  GLUCOSE, CAPILLARY - Abnormal; Notable for the following:    Glucose-Capillary 430 (*)    All other components within normal limits  GLUCOSE, CAPILLARY - Abnormal; Notable for the following:    Glucose-Capillary 336 (*)    All other components within normal limits  CULTURE, EXPECTORATED SPUTUM-ASSESSMENT  URINE RAPID DRUG SCREEN, HOSP PERFORMED  ETHANOL  BASIC METABOLIC PANEL  CBC  I-STAT TROPOININ, ED    Imaging Review Dg Chest 2 View  06/14/2015   CLINICAL DATA:  Dyspnea. Cough, shortness of breath, nausea and headache.  EXAM: CHEST  2 VIEW  COMPARISON:  04/29/2015  FINDINGS: Lung volumes are low. Heart remains at the upper limits of normal in size. Prominent interstitial opacities suspicious for pulmonary edema, similar to prior exam. Small right pleural effusion and fluid in the right minor fissure. Mild atelectasis at the right lung base. No consolidation to suggest pneumonia. No pneumothorax. No  acute osseous abnormality is seen.  IMPRESSION: 1. Stable borderline cardiomegaly and interstitial prominence suspicious for pulmonary edema. 2. Small right pleural effusion, new.   Electronically Signed   By: Jeb Levering M.D.   On: 06/14/2015 05:43     EKG Interpretation   Date/Time:  Sunday June 14 2015 04:40:51 EDT Ventricular Rate:  113 PR Interval:  146 QRS Duration: 84 QT Interval:  348 QTC Calculation: 477 R Axis:   26 Text Interpretation:  Sinus tachycardia Cannot rule out Anterior infarct ,  age undetermined Abnormal ECG SINCE LAST TRACING HEART RATE HAS INCREASED  Confirmed by Debby Freiberg [phone]) on 06/14/2015 6:38:49 AM      MDM   Final diagnoses:  None    54 y.o. female with pertinent PMH of COPD, CHF, DM presents with dyspnea as above.  Physical exam on arrival concerning for volume overload.  Wu consistent with same.  Given lasix and admitted to medicine.    I have reviewed all laboratory and imaging studies if ordered as above  1. CHF exacerbation    Debby Freiberg, MD 06/14/15 [phone]

## 2015-06-15 DIAGNOSIS — J449 Chronic obstructive pulmonary disease, unspecified: Secondary | ICD-10-CM

## 2015-06-15 DIAGNOSIS — J9600 Acute respiratory failure, unspecified whether with hypoxia or hypercapnia: Secondary | ICD-10-CM

## 2015-06-15 LAB — GLUCOSE, CAPILLARY
GLUCOSE-CAPILLARY: 301 mg/dL — AB (ref 65–99)
GLUCOSE-CAPILLARY: 408 mg/dL — AB (ref 65–99)
GLUCOSE-CAPILLARY: 462 mg/dL — AB (ref 65–99)
Glucose-Capillary: 471 mg/dL — ABNORMAL HIGH (ref 65–99)
Glucose-Capillary: 420 mg/dL — ABNORMAL HIGH (ref 65–99)
Glucose-Capillary: 426 mg/dL — ABNORMAL HIGH (ref 65–99)

## 2015-06-15 LAB — BASIC METABOLIC PANEL
ANION GAP: 7 (ref 5–15)
BUN: 30 mg/dL — AB (ref 6–20)
CALCIUM: 8.8 mg/dL — AB (ref 8.9–10.3)
CHLORIDE: 98 mmol/L — AB (ref 101–111)
CO2: 28 mmol/L (ref 22–32)
CREATININE: 0.96 mg/dL (ref 0.44–1.00)
GFR calc Af Amer: >60 mL/min (ref >60)
GLUCOSE: 427 mg/dL — AB (ref 65–99)
POTASSIUM: 4.6 mmol/L (ref 3.5–5.1)
Sodium: 133 mmol/L — ABNORMAL LOW (ref 135–145)

## 2015-06-15 LAB — CBC
HCT: 30.6 % — ABNORMAL LOW (ref 36.0–46.0)
HEMOGLOBIN: 9.8 g/dL — AB (ref 12.0–15.0)
MCH: 27.5 pg (ref 26.0–34.0)
MCHC: 32 g/dL (ref 30.0–36.0)
MCV: 86 fL (ref 78.0–100.0)
PLATELETS: 268 10*3/uL (ref 150–400)
RBC: 3.56 MIL/uL — ABNORMAL LOW (ref 3.87–5.11)
RDW: 14.9 % (ref 11.5–15.5)
WBC: 15.2 10*3/uL — ABNORMAL HIGH (ref 4.0–10.5)

## 2015-06-15 MED ORDER — INSULIN GLARGINE 100 UNIT/ML ~~LOC~~ SOLN
20.0000 [IU] | Freq: Once | SUBCUTANEOUS | Status: AC
  Administered 2015-06-15: 20 [IU] via SUBCUTANEOUS
  Filled 2015-06-15: qty 0.2

## 2015-06-15 MED ORDER — DEXTROSE 50 % IV SOLN
25.0000 mL | Freq: Once | INTRAVENOUS | Status: DC
  Filled 2015-06-15: qty 50

## 2015-06-15 MED ORDER — INSULIN ASPART 100 UNIT/ML ~~LOC~~ SOLN
22.0000 [IU] | Freq: Once | SUBCUTANEOUS | Status: AC
  Administered 2015-06-15: 22 [IU] via SUBCUTANEOUS

## 2015-06-15 MED ORDER — LIVING BETTER WITH HEART FAILURE BOOK
Freq: Once | Status: AC
  Administered 2015-06-17: 18:00:00

## 2015-06-15 NOTE — Progress Notes (Addendum)
MD paged regarding 5 beat run of v tach. Pt asymptomstic.No new orders received. Will  continue to monitor closely.  Raliegh Ip RN

## 2015-06-15 NOTE — Progress Notes (Signed)
[facility] PROGRESS NOTE  MCKENNAH STANFORTH [id number] DOB: July 22, 1961 DOA: 06/14/2015  PCP: Kevan Ny, MD  Brief HPI: 54 year old Caucasian female with a past medical history of hypertension, hyperlipidemia, COPD, chronic combined diastolic and systolic CHF with a last known EF of 40% from an echocardiogram done in December 2015, diabetes mellitus type 2, atrial fibrillation. She stopped her anticoagulation on her own. She ran out of her diuretics a few days ago. She presented with complaints of shortness of breath and orthopnea. She was hospitalized for further management.  Past medical history:  Past Medical History  Diagnosis Date  . Alcohol abuse   . Narcotic abuse   . Cocaine abuse   . Marijuana abuse   . Tobacco abuse   . Alcoholic cirrhosis   . Cardiomyopathy   . Obesity   . CHF (congestive heart failure)   . Hypothyroidism   . GERD (gastroesophageal reflux disease)   . Atrial fibrillation   . Critical lower limb ischemia   . Hypertension   . CAD (coronary artery disease)     a. cath 11/10/2014 small vessel CAD. Demand ischemia in the setting of rapid a-fib  . Pneumonia     END MARCH 2016  . Anxiety   . Wrist fracture     LEFT; "just did xray and cast"  . DOE (dyspnea on exertion) 04/29/2015  . Poorly controlled type 2 diabetes mellitus   . Hyperlipemia   . Peripheral arterial disease     a. 01/2015 Angio/PTA: LSFA 100 w/ recon @ adductor canal and 1 vessel runoff via AT, RSFA 99 (atherectomy/pta) - 1 vessel runoff via diff dzs peroneal.  . Carotid artery disease     a. 01/2015 Carotid Angio: RICA [id number], LICA [id number].  [name] COPD (chronic obstructive pulmonary disease)   . Arthritis     "hands, arms, shoulders, legs, back" (04/29/2015)  . Diabetic peripheral neuropathy     Consultants: None  Procedures: None  Antibiotics: None  Subjective: Patient feels better this morning. No shortness breath as yesterday. No chest pain. No nausea,  vomiting.  Objective: Vital Signs  Filed Vitals:   06/14/15 2026 06/14/15 2116 06/15/15 0434 06/15/15 0848  BP: 147/75  128/78   Pulse: 84  81   Temp: 98 F (36.7 C)  97.7 F (36.5 C)   TempSrc: Oral  Oral   Resp: 16  16   Height:      Weight:   103.556 kg (228 lb 4.8 oz)   SpO2: 99% 97% 98% 96%    Intake/Output Summary (Last 24 hours) at 06/15/15 0939 Last data filed at 06/15/15 [id number]  Gross per 24 hour  Intake   1540 ml  Output   2450 ml  Net   -910 ml   Filed Weights   06/14/15 0440 06/15/15 0434  Weight: 106.198 kg (234 lb 2 oz) 103.556 kg (228 lb 4.8 oz)    General appearance: alert, cooperative, appears stated age, no distress and moderately obese Resp: Crackles present, about one third of the lung fields laterally. No wheezing, no rhonchi. Cardio: regular rate and rhythm, S1, S2 normal, no murmur, click, rub or gallop GI: soft, non-tender; bowel sounds normal; no masses,  no organomegaly Extremities: 1-2+ pitting edema bilateral lower extremities Neurologic: No focal deficits  Lab Results:  Basic Metabolic Panel:  Recent Labs Lab 06/14/15 0500 06/15/15 0517  NA 137 133*  K 4.3 4.6  CL 104 98*  CO2 23 28  GLUCOSE 289* 427*  BUN 16 30*  CREATININE 0.83 0.96  CALCIUM 9.2 8.8*   CBC:  Recent Labs Lab 06/14/15 0500 06/15/15 0517  WBC 15.8* 15.2*  NEUTROABS 13.1*  --   HGB 10.1* 9.8*  HCT 31.4* 30.6*  MCV 87.7 86.0  PLT 286 268   BNP (last 3 results)  Recent Labs  04/29/15 1553 06/14/15 0500  BNP 1483.5* 1044.2*   CBG:  Recent Labs Lab 06/14/15 0914 06/14/15 1123 06/14/15 1630 06/14/15 2126 06/15/15 0549  GLUCAP 230* 380* 430* 336* 408*       Studies/Results: Dg Chest 2 View  06/14/2015   CLINICAL DATA:  Dyspnea. Cough, shortness of breath, nausea and headache.  EXAM: CHEST  2 VIEW  COMPARISON:  04/29/2015  FINDINGS: Lung volumes are low. Heart remains at the upper limits of normal in size. Prominent interstitial opacities  suspicious for pulmonary edema, similar to prior exam. Small right pleural effusion and fluid in the right minor fissure. Mild atelectasis at the right lung base. No consolidation to suggest pneumonia. No pneumothorax. No acute osseous abnormality is seen.  IMPRESSION: 1. Stable borderline cardiomegaly and interstitial prominence suspicious for pulmonary edema. 2. Small right pleural effusion, new.   Electronically Signed   By: Jeb Levering M.D.   On: 06/14/2015 05:43    Medications:  Scheduled: . aspirin EC  81 mg Oral Daily  . carvedilol  6.25 mg Oral BID WC  . clopidogrel  75 mg Oral Q breakfast  . enoxaparin (LOVENOX) injection  40 mg Subcutaneous Q24H  . furosemide  40 mg Intravenous BID  . gabapentin  400 mg Oral QID  . guaiFENesin  600 mg Oral BID  . hydrOXYzine  50 mg Oral QHS  . insulin aspart  0-20 Units Subcutaneous TID WC  . insulin glargine  40 Units Subcutaneous QHS  . levalbuterol  0.63 mg Nebulization TID  . levothyroxine  50 mcg Oral QAC breakfast  . lisinopril  20 mg Oral Daily  . Living Better with Heart Failure Book   Does not apply Once  . pantoprazole  40 mg Oral Daily  . pravastatin  80 mg Oral q1800  . sodium chloride  3 mL Intravenous Q12H   Continuous:  [id number], levalbuterol, ondansetron **OR** ondansetron (ZOFRAN) IV  Assessment/Plan:  Principal Problem:   Acute respiratory failure Active Problems:   Alcohol abuse   Cigarette smoker   Atrial fibrillation with RVR   Carotid arterial disease   COPD (chronic obstructive pulmonary disease)   Acute on chronic combined systolic and diastolic heart failure   CHF exacerbation    Acute respiratory failure Secondary to congestive heart failure. Not wheezing. Unlikely there is any COPD exacerbation at this time. We will stop her steroids.  Acute on chronic combined systolic and diastolic congestive heart failure Patient ran out of her diuretics a few days ago. Her presentation  is most likely due to noncompliance. Continue IV Lasix. Monitor ins and outs. Daily weights. No need to repeat another echocardiogram. Continue her ACE inhibitor.  History of COPD Stable. No need for steroids. Continue current home medications.  History of atrial fibrillation [place] score is 5. She stopped anticoagulation on her own. She does not want to resume this at this time. The risks of stroke and thromboembolic events were discussed with the patient at the time of admission. She will need to follow-up with her cardiologist for further discussion. She is followed by Dr. Gwenlyn Found.  Diabetes mellitus type 2 with hyperglycemia Increase dose of Lantus.  Stop her steroids. Continue sliding scale coverage. Check HbA1c.  Carotid artery disease Continue aspirin and Plavix. She is status post left carotid endarterectomy in April of this year.  Tobacco abuse Counseled to stop smoking.  Anemia of chronic disease Continue to monitor CBCs  History of polysubstance abuse in the past Denies any recent use.  DVT Prophylaxis: Lovenox    Code Status: Full code  Family Communication: Discussed with the patient  Disposition Plan: Continue current management.  Follow-up Appointment?: Will need to follow-up with her cardiologist 1 week after discharge.   LOS: 1 day   [facility] Pager [phone] 06/15/2015, 9:39 AM  If 7PM-7AM, please contact night-coverage at [email or web address], password [facility]

## 2015-06-15 NOTE — Progress Notes (Signed)
Utilization Review Completed.[name] T8/11/2014  

## 2015-06-15 NOTE — Progress Notes (Signed)
Inpatient Diabetes Program Recommendations  AACE/ADA: New Consensus Statement on Inpatient Glycemic Control (2013)  Target Ranges:  Prepandial:   less than 140 mg/dL      Peak postprandial:   less than 180 mg/dL (1-2 hours)      Critically ill patients:  140 - 180 mg/dL   Results for SONNET, TUGWELL (MRN [id number]) as of 06/15/2015 09:12  Ref. Range 06/14/2015 09:14 06/14/2015 11:23 06/14/2015 16:30 06/14/2015 21:26 06/15/2015 05:49  Glucose-Capillary Latest Ref Range: 65-99 mg/dL 230 (H) 380 (H) 430 (H) 336 (H) 408 (H)   Reason for Admission: Acute on Chronic CHF/COPD  Diabetes history: DM 2 Outpatient Diabetes medications: Lantus 40 units, Novolog 7-10 units TID meal coverage, Metformin 1,000 mg BID Current orders for Inpatient glycemic control: Lantus 40 units QHS, Novolog Resistant scale TID  Inpatient Diabetes Program Recommendations Insulin - Basal: Patient receiving 60mg  IV Solumedrol Q 6hrs. Please increase basal insulin to 50-55 units. Please give additional 10-15 units this am. Correction (SSI): Please add Novolog HS scale. Insulin - Meal Coverage: Patient takes 7-10 units of meal coverage at home. Please consider ordering Novolog 5 units TID meal coverage in addition to correction scale.  Note: Patient did not receive any Novolog all day yesterday and last night. Patient received 22 units this am 8/1.  Thanks,  Tama Headings RN, MSN, [facility] Inpatient Diabetes Coordinator Team Pager [phone]

## 2015-06-15 NOTE — Progress Notes (Addendum)
Patient blood sugar is 469.  Patient is asymptomatic.  Spoke face to face with Dr. Maryland Pink.  Orders placed to give 20 units of short acting insulin and a new order for lantus.  Medication given to patient.  Will recheck blood glucose level.

## 2015-06-16 LAB — BASIC METABOLIC PANEL
ANION GAP: 9 (ref 5–15)
BUN: 41 mg/dL — ABNORMAL HIGH (ref 6–20)
CALCIUM: 9.1 mg/dL (ref 8.9–10.3)
CHLORIDE: 96 mmol/L — AB (ref 101–111)
CO2: 30 mmol/L (ref 22–32)
CREATININE: 1.24 mg/dL — AB (ref 0.44–1.00)
GFR calc Af Amer: 57 mL/min — ABNORMAL LOW (ref >60)
GFR calc non Af Amer: 49 mL/min — ABNORMAL LOW (ref >60)
Glucose, Bld: 182 mg/dL — ABNORMAL HIGH (ref 65–99)
POTASSIUM: 3.5 mmol/L (ref 3.5–5.1)
Sodium: 135 mmol/L (ref 135–145)

## 2015-06-16 LAB — GLUCOSE, CAPILLARY
GLUCOSE-CAPILLARY: 256 mg/dL — AB (ref 65–99)
GLUCOSE-CAPILLARY: 239 mg/dL — AB (ref 65–99)
Glucose-Capillary: 107 mg/dL — ABNORMAL HIGH (ref 65–99)
Glucose-Capillary: 227 mg/dL — ABNORMAL HIGH (ref 65–99)

## 2015-06-16 MED ORDER — INSULIN ASPART 100 UNIT/ML ~~LOC~~ SOLN
8.0000 [IU] | Freq: Once | SUBCUTANEOUS | Status: AC
  Administered 2015-06-16: 8 [IU] via SUBCUTANEOUS

## 2015-06-16 MED ORDER — ALUM & MAG HYDROXIDE-SIMETH 200-200-20 MG/5ML PO SUSP
15.0000 mL | Freq: Four times a day (QID) | ORAL | Status: DC | PRN
  Administered 2015-06-16 – 2015-06-18 (×3): 15 mL via ORAL
  Filled 2015-06-16 (×3): qty 30

## 2015-06-16 MED ORDER — ACETAMINOPHEN 325 MG PO TABS
650.0000 mg | ORAL_TABLET | Freq: Once | ORAL | Status: AC
  Administered 2015-06-16: 650 mg via ORAL
  Filled 2015-06-16: qty 2

## 2015-06-16 NOTE — Progress Notes (Signed)
[facility] PROGRESS NOTE  NEELIE MEN [id number] DOB: 13-Jan-1961 DOA: 06/14/2015  PCP: Kevan Ny, MD  Brief HPI: 54 year old Caucasian female with a past medical history of hypertension, hyperlipidemia, COPD, chronic combined diastolic and systolic CHF with a last known EF of 40% from an echocardiogram done in December 2015, diabetes mellitus type 2, atrial fibrillation. She stopped her anticoagulation on her own. She ran out of her diuretics a few days ago. She presented with complaints of shortness of breath and orthopnea. She was hospitalized for further management.  Past medical history:  Past Medical History  Diagnosis Date  . Alcohol abuse   . Narcotic abuse   . Cocaine abuse   . Marijuana abuse   . Tobacco abuse   . Alcoholic cirrhosis   . Cardiomyopathy   . Obesity   . CHF (congestive heart failure)   . Hypothyroidism   . GERD (gastroesophageal reflux disease)   . Atrial fibrillation   . Critical lower limb ischemia   . Hypertension   . CAD (coronary artery disease)     a. cath 11/10/2014 small vessel CAD. Demand ischemia in the setting of rapid a-fib  . Pneumonia     END MARCH 2016  . Anxiety   . Wrist fracture     LEFT; "just did xray and cast"  . DOE (dyspnea on exertion) 04/29/2015  . Poorly controlled type 2 diabetes mellitus   . Hyperlipemia   . Peripheral arterial disease     a. 01/2015 Angio/PTA: LSFA 100 w/ recon @ adductor canal and 1 vessel runoff via AT, RSFA 99 (atherectomy/pta) - 1 vessel runoff via diff dzs peroneal.  . Carotid artery disease     a. 01/2015 Carotid Angio: RICA [id number], LICA [id number].  [name] COPD (chronic obstructive pulmonary disease)   . Arthritis     "hands, arms, shoulders, legs, back" (04/29/2015)  . Diabetic peripheral neuropathy     Consultants: None  Procedures: None  Antibiotics: None  Subjective: Patient continues to feel better. Denies any chest pain. Breathing is improving. Swelling in the legs is improving.    Objective: Vital Signs  Filed Vitals:   06/15/15 1956 06/15/15 2023 06/16/15 0453 06/16/15 0752  BP:  163/71 111/42   Pulse:  83 68 81  Temp:  97.4 F (36.3 C) 97.8 F (36.6 C)   TempSrc:  Oral Oral   Resp:  18 18 16   Height:      Weight:   103.5 kg (228 lb 2.8 oz)   SpO2: 97% 100% 100% 99%    Intake/Output Summary (Last 24 hours) at 06/16/15 0836 Last data filed at 06/16/15 0458  Gross per 24 hour  Intake    620 ml  Output   2050 ml  Net  -1430 ml   Filed Weights   06/14/15 0440 06/15/15 0434 06/16/15 0453  Weight: 106.198 kg (234 lb 2 oz) 103.556 kg (228 lb 4.8 oz) 103.5 kg (228 lb 2.8 oz)    General appearance: alert, cooperative, appears stated age, no distress and moderately obese Resp: Improving air entry bilaterally. Less crackles compared to yesterday. No wheezing, no rhonchi. Cardio: regular rate and rhythm, S1, S2 normal, no murmur, click, rub or gallop GI: soft, non-tender; bowel sounds normal; no masses,  no organomegaly Extremities: 1+ pitting edema bilateral lower extremities. Improving Neurologic: No focal deficits  Lab Results:  Basic Metabolic Panel:  Recent Labs Lab 06/14/15 0500 06/15/15 0517 06/16/15 0327  NA 137 133* 135  K 4.3  4.6 3.5  CL 104 98* 96*  CO2 23 28 30   GLUCOSE 289* 427* 182*  BUN 16 30* 41*  CREATININE 0.83 0.96 1.24*  CALCIUM 9.2 8.8* 9.1   CBC:  Recent Labs Lab 06/14/15 0500 06/15/15 0517  WBC 15.8* 15.2*  NEUTROABS 13.1*  --   HGB 10.1* 9.8*  HCT 31.4* 30.6*  MCV 87.7 86.0  PLT 286 268   BNP (last 3 results)  Recent Labs  04/29/15 1553 06/14/15 0500  BNP 1483.5* 1044.2*   CBG:  Recent Labs Lab 06/15/15 1256 06/15/15 1526 06/15/15 2047 06/15/15 2355 06/16/15 0554  GLUCAP 471* 420* 426* 301* 107*       Studies/Results: No results found.  Medications:  Scheduled: . aspirin EC  81 mg Oral Daily  . carvedilol  6.25 mg Oral BID WC  . clopidogrel  75 mg Oral Q breakfast  . enoxaparin  (LOVENOX) injection  40 mg Subcutaneous Q24H  . furosemide  40 mg Intravenous BID  . gabapentin  400 mg Oral QID  . guaiFENesin  600 mg Oral BID  . hydrOXYzine  50 mg Oral QHS  . insulin aspart  0-20 Units Subcutaneous TID WC  . insulin glargine  40 Units Subcutaneous QHS  . levalbuterol  0.63 mg Nebulization TID  . levothyroxine  50 mcg Oral QAC breakfast  . lisinopril  20 mg Oral Daily  . Living Better with Heart Failure Book   Does not apply Once  . pantoprazole  40 mg Oral Daily  . pravastatin  80 mg Oral q1800  . sodium chloride  3 mL Intravenous Q12H   Continuous:  [id number], levalbuterol, ondansetron **OR** ondansetron (ZOFRAN) IV  Assessment/Plan:  Principal Problem:   Acute respiratory failure Active Problems:   Alcohol abuse   Alcoholic cirrhosis   Cigarette smoker   DM (diabetes mellitus), type 2 with peripheral vascular complications   Atrial fibrillation with RVR   Carotid arterial disease   COPD (chronic obstructive pulmonary disease)   Acute on chronic combined systolic and diastolic heart failure   CHF exacerbation    Acute respiratory failure Secondary to congestive heart failure. Improving slowly.  Acute on chronic combined systolic and diastolic congestive heart failure Patient appears to be diuresis well. Bump in creatinine noted. Continue to watch for now. Patient ran out of her diuretics a few days prior to admission. Her presentation is most likely due to noncompliance. Weight is up on this admission. Last known dry weight on June 19th, 2016 220 lbs. . She is still about 8 pounds over. Continue IV Lasix. Monitor ins and outs. Daily weights. No need to repeat another echocardiogram as her last one was in December 2015, which showed EF of 40-45%. Continue her ACE inhibitor.  History of COPD Stable. No wheezing. Steroids were discontinued. She continues to be stable. Continue current home medications. Leukocytosis is secondary to  steroids. Continue to watch for now.  History of atrial fibrillation Chads score is 5. She stopped anticoagulation on her own. Xarelto apparently was causing nausea and vomiting. She is open to being started on a new medication. The risks of stroke and thromboembolic events were discussed with the patient at the time of admission. She is followed by Dr. Gwenlyn Found. Could consider initiating Eliquis if renal function remains stable.  Elevated BUN/creatinine Possibly from diuresis. Monitor for now. Repeat labs in the morning.  Diabetes mellitus type 2 with hyperglycemia Glycemic control has improved with cessation of steroids. Continue Lantus and sliding scale coverage.  HbA1c is pending.  Carotid artery disease Continue aspirin and Plavix. She is status post left carotid endarterectomy in April of this year.  Tobacco abuse Counseled to stop smoking.  Anemia of chronic disease Continue to monitor CBCs. Hemoglobin is stable.  History of polysubstance abuse in the past Denies any recent use.  DVT Prophylaxis: Lovenox    Code Status: Full code  Family Communication: Discussed with the patient  Disposition Plan: Continue current management.  Follow-up Appointment?: Will need to follow-up with her cardiologist 1 week after discharge.   LOS: 2 days   [facility] Pager [phone] 06/16/2015, 8:36 AM  If 7PM-7AM, please contact night-coverage at [email or web address], password [facility]

## 2015-06-16 NOTE — Care Management Note (Signed)
Case Management Note  Patient Details  Name: [name] MRN: [id number] Date of Birth: 05-15-1961  Subjective/Objective:   Pt admitted with acute respiratory failure                 Action/Plan:  Pt is independent from home.  Pt has recently had her medicaid cancelled and pt is actively looking for a PCP.  CM will assist and will continue to monitor for disposition needs   Expected Discharge Date:                  Expected Discharge Plan:  Home/Self Care  In-House Referral:  Financial Counselor  Discharge planning Services  CM Consult  Post Acute Care Choice:    Choice offered to:     DME Arranged:    DME Agency:     HH Arranged:    [place] Agency:     Status of Service:  In process, will continue to follow  Medicare Important Message Given:    Date Medicare IM Given:    Medicare IM give by:    Date Additional Medicare IM Given:    Additional Medicare Important Message give by:     If discussed at [place] of Stay Meetings, dates discussed:    Additional Comments: Pt agreed to [facility] making appointment at [facility].  CM set up post discharge appt for 06/22/15 at 10:30 am.  Clinic Brochure provided to pt, pt informed to request medication assistance during intal appointment, pt also informed that she can get medications filled at clinic starting discharge date.   Maryclare Labrador, RN 06/16/2015, 11:52 AM

## 2015-06-16 NOTE — Progress Notes (Signed)
Inpatient Diabetes Program Recommendations  AACE/ADA: New Consensus Statement on Inpatient Glycemic Control (2013)  Target Ranges:  Prepandial:   less than 140 mg/dL      Peak postprandial:   less than 180 mg/dL (1-2 hours)      Critically ill patients:  140 - 180 mg/dL   Patient received a total of 60 units of basal yesterday. 40 units currently ordered QHS. Fasting this am looks good at 107 mg/dl. May have to increase basal dose to keep patient at goal.  Thanks,  Tama Headings RN, MSN, [facility] Inpatient Diabetes Coordinator Team Pager [phone]

## 2015-06-16 NOTE — Progress Notes (Signed)
Patient walked 150 feet with standby and walker.  No shortness of breath.  Steady gait.  Patient complained of lower leg pain.

## 2015-06-17 DIAGNOSIS — E1159 Type 2 diabetes mellitus with other circulatory complications: Secondary | ICD-10-CM

## 2015-06-17 DIAGNOSIS — K7031 Alcoholic cirrhosis of liver with ascites: Secondary | ICD-10-CM

## 2015-06-17 DIAGNOSIS — F1010 Alcohol abuse, uncomplicated: Secondary | ICD-10-CM

## 2015-06-17 DIAGNOSIS — I5043 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) and diastolic (congestive) heart failure: Principal | ICD-10-CM

## 2015-06-17 DIAGNOSIS — J438 Other emphysema: Secondary | ICD-10-CM

## 2015-06-17 LAB — BASIC METABOLIC PANEL
ANION GAP: 9 (ref 5–15)
BUN: 37 mg/dL — AB (ref 6–20)
CHLORIDE: 100 mmol/L — AB (ref 101–111)
CO2: 29 mmol/L (ref 22–32)
CREATININE: 0.97 mg/dL (ref 0.44–1.00)
Calcium: 8.5 mg/dL — ABNORMAL LOW (ref 8.9–10.3)
GFR calc Af Amer: >60 mL/min (ref >60)
GFR calc non Af Amer: >60 mL/min (ref >60)
GLUCOSE: 140 mg/dL — AB (ref 65–99)
POTASSIUM: 3.8 mmol/L (ref 3.5–5.1)
Sodium: 138 mmol/L (ref 135–145)

## 2015-06-17 LAB — CBC
HCT: 32.3 % — ABNORMAL LOW (ref 36.0–46.0)
Hemoglobin: 10.4 g/dL — ABNORMAL LOW (ref 12.0–15.0)
MCH: 27.7 pg (ref 26.0–34.0)
MCHC: 32.2 g/dL (ref 30.0–36.0)
MCV: 86.1 fL (ref 78.0–100.0)
Platelets: 328 10*3/uL (ref 150–400)
RBC: 3.75 MIL/uL — ABNORMAL LOW (ref 3.87–5.11)
RDW: 15 % (ref 11.5–15.5)
WBC: 10.9 10*3/uL — ABNORMAL HIGH (ref 4.0–10.5)

## 2015-06-17 LAB — HEMOGLOBIN A1C
Hgb A1c MFr Bld: 8.7 % — ABNORMAL HIGH (ref 4.8–5.6)
MEAN PLASMA GLUCOSE: 203 mg/dL

## 2015-06-17 LAB — GLUCOSE, CAPILLARY
GLUCOSE-CAPILLARY: 262 mg/dL — AB (ref 65–99)
Glucose-Capillary: 146 mg/dL — ABNORMAL HIGH (ref 65–99)
Glucose-Capillary: 204 mg/dL — ABNORMAL HIGH (ref 65–99)
Glucose-Capillary: 199 mg/dL — ABNORMAL HIGH (ref 65–99)

## 2015-06-17 MED ORDER — OXYCODONE HCL 5 MG PO TABS
5.0000 mg | ORAL_TABLET | Freq: Four times a day (QID) | ORAL | Status: AC | PRN
  Administered 2015-06-17 – 2015-06-18 (×2): 5 mg via ORAL
  Filled 2015-06-17 (×2): qty 1

## 2015-06-17 NOTE — Progress Notes (Signed)
[facility] PROGRESS NOTE  [name] [id number] DOB: 29-Aug-1961 DOA: 06/14/2015 PCP: Karla Ny, MD   Brief HPI: 54 year old Caucasian female with a past medical history of hypertension, hyperlipidemia, COPD, chronic combined diastolic and systolic CHF with a last known EF of 40% from an echocardiogram done in December 2015, diabetes mellitus type 2, atrial fibrillation. She stopped her anticoagulation on her own. She ran out of her diuretics a few days ago. She presented with complaints of shortness of breath and orthopnea. She was hospitalized for further management.  Assessment/Plan: Acute respiratory failure Secondary to congestive heart failure. Improving slowly.  Acute on chronic combined systolic and diastolic congestive heart failure Patient appears to be diuresis well. Bump in creatinine noted. Continue to watch for now. Patient ran out of her diuretics a few days prior to admission. Her presentation is most likely due to noncompliance. Weight is up on this admission. Last known dry weight on June 19th, 2016 220 lbs (100kg). Wt today 102.9kg. Continue IV Lasix. Monitor ins and outs. Daily weights. No need to repeat another echocardiogram as her last one was in December 2015, which showed EF of 40-45%. Continue her ACE inhibitor.  History of COPD Stable. No wheezing. Steroids were discontinued. She continues to be stable. Continue current home medications. Leukocytosis is secondary to steroids. Continue to watch for now.  History of atrial fibrillation Chads score is 5. She stopped anticoagulation on her own. Xarelto apparently was causing nausea and vomiting. She is open to being started on a new medication. The risks of stroke and thromboembolic events were discussed with the patient at the time of admission. She is followed by Dr. Gwenlyn Price. Consider possible Eliquis if renal function remains stable.  Elevated BUN/creatinine Possibly secondary to diuresis. Repeat labs in the  morning.  Diabetes mellitus type 2 with hyperglycemia Glycemic control has improved with cessation of steroids. Continue Lantus and sliding scale coverage. HbA1c is 8.7  Carotid artery disease Continue aspirin and Plavix. She is status post left carotid endarterectomy in April of this year.  Tobacco abuse Counseled to stop smoking.  Anemia of chronic disease Continue to monitor CBCs. Hemoglobin is stable.  History of polysubstance abuse in the past Denies any recent use.  Code Status: Full Family Communication: Pt in room Disposition Plan: Pending   Consultants:    Procedures:    Antibiotics:    HPI/Subjective: Feels better today. No complaints  Objective: Filed Vitals:   06/17/15 0452 06/17/15 0718 06/17/15 1453 06/17/15 1515  BP: 133/78  99/67   Pulse: 85 85 87 86  Temp: 98.6 F (37 C)  98.4 F (36.9 C)   TempSrc: Oral  Oral   Resp:  20 18 18   Height:      Weight: 102.967 kg (227 lb)     SpO2: 100% 100% 92% 95%    Intake/Output Summary (Last 24 hours) at 06/17/15 1638 Last data filed at 06/17/15 1303  Gross per 24 hour  Intake   1080 ml  Output    400 ml  Net    680 ml   Filed Weights   06/15/15 0434 06/16/15 0453 06/17/15 0452  Weight: 103.556 kg (228 lb 4.8 oz) 103.5 kg (228 lb 2.8 oz) 102.967 kg (227 lb)    Exam:   General:  Awake, in nad  Cardiovascular: regular, s1, s2  Respiratory: normal resp effort, no wheezing  Abdomen: soft,obese, nondistended  Musculoskeletal: perfused, no clubbing   Data Reviewed: Basic Metabolic Panel:  Recent Labs Lab  06/14/15 0500 06/15/15 0517 06/16/15 0327 06/17/15 0449  NA 137 133* 135 138  Price 4.3 4.6 3.5 3.8  CL 104 98* 96* 100*  CO2 23 28 30 29   GLUCOSE 289* 427* 182* 140*  BUN 16 30* 41* 37*  CREATININE 0.83 0.96 1.24* 0.97  CALCIUM 9.2 8.8* 9.1 8.5*   Liver Function Tests: No results for input(s): AST, ALT, ALKPHOS, BILITOT, PROT, ALBUMIN in the last 168 hours. No results for  input(s): LIPASE, AMYLASE in the last 168 hours. No results for input(s): AMMONIA in the last 168 hours. CBC:  Recent Labs Lab 06/14/15 0500 06/15/15 0517 06/17/15 0449  WBC 15.8* 15.2* 10.9*  NEUTROABS 13.1*  --   --   HGB 10.1* 9.8* 10.4*  HCT 31.4* 30.6* 32.3*  MCV 87.7 86.0 86.1  PLT 286 268 328   Cardiac Enzymes: No results for input(s): CKTOTAL, CKMB, CKMBINDEX, TROPONINI in the last 168 hours. BNP (last 3 results)  Recent Labs  04/29/15 1553 06/14/15 0500  BNP 1483.5* 1044.2*    ProBNP (last 3 results)  Recent Labs  09/07/14 0406 10/29/14 0222  PROBNP 1423.0* 1070.0*    CBG:  Recent Labs Lab 06/16/15 1703 06/16/15 2049 06/17/15 0611 06/17/15 1109 06/17/15 1611  GLUCAP 256* 239* 146* 204* 199*    No results Price for this or any previous visit (from the past 240 hour(s)).   Studies: No results Price.  Scheduled Meds: . aspirin EC  81 mg Oral Daily  . carvedilol  6.25 mg Oral BID WC  . clopidogrel  75 mg Oral Q breakfast  . enoxaparin (LOVENOX) injection  40 mg Subcutaneous Q24H  . furosemide  40 mg Intravenous BID  . gabapentin  400 mg Oral QID  . guaiFENesin  600 mg Oral BID  . hydrOXYzine  50 mg Oral QHS  . insulin aspart  0-20 Units Subcutaneous TID WC  . insulin glargine  40 Units Subcutaneous QHS  . levalbuterol  0.63 mg Nebulization TID  . levothyroxine  50 mcg Oral QAC breakfast  . lisinopril  20 mg Oral Daily  . Living Better with Heart Failure Book   Does not apply Once  . pantoprazole  40 mg Oral Daily  . pravastatin  80 mg Oral q1800  . sodium chloride  3 mL Intravenous Q12H   Continuous Infusions:   Principal Problem:   Acute respiratory failure Active Problems:   Alcohol abuse   Alcoholic cirrhosis   Cigarette smoker   DM (diabetes mellitus), type 2 with peripheral vascular complications   Atrial fibrillation with RVR   Carotid arterial disease   COPD (chronic obstructive pulmonary disease)   Acute on chronic  combined systolic and diastolic heart failure   CHF exacerbation    [name]  [facility] Pager [phone]. If 7PM-7AM, please contact night-coverage at [email or web address], password [facility] 06/17/2015, 4:38 PM  LOS: 3 days

## 2015-06-17 NOTE — Hospital Discharge Follow-Up (Signed)
Transitional Care Clinic Care Coordination Note:  Admit date:  06/14/15 Discharge date:  TBD Discharge Disposition: Home when stable Patient contact: Patient indicates her phone is "out of minutes." She indicates she has applied for an [employer] phone, and she should be getting it soon (although date unknown). Emergency contact(s): Earvin Hansen [phone]    Patient indicates she is staying in a motel with Butch Penny and her sister. She indicates Butch Penny can be called after discharge, and she will be able to reach patient.   This Case Manager reviewed patient's EMR and determined patient would benefit from post-discharge medical management and chronic care management services through the [facility]. Patient has a history of hypertension, hyperlipidemia, COPD, chronic combined diastolic and systolic CHF. Admitted with shortness of breath and orthopnea.  Patient has had 3 admissions in the last 6 months.This Case Manager met with patient to discuss the services and medical management that can be provided at the [facility]. Patient verbalized understanding and agreed to receive post-discharge care at the [facility].   Patient scheduled for Transitional Care appointment on 06/22/15 at 1030 with Dr. Jarold Song.  Clinic information and appointment time provided to patient. Appointment information also placed on AVS.  Assessment:       Home Environment: Patient indicates she has been living in a motel for the last two months. She plans to live there until she can find a house to rent. She is living in motel with her sister and friend, Jobe Gibbon. She indicates she believes the motel is called ?[employer]. She does not know address. Will need to follow-up with patient after discharge to determine exact name and address of motel.       Support System: sister       Level of functioning: independent       Home DME: walker, nebulizer. Patient indicates she has a scale,  but it has not been working well. She indicates she has money to buy another scale. Encouraged patient to purchase working scale.       Home care services: Patient indicates she had personal care services through Medicaid, but services have ended because she lost her Medicaid coverage.            Transportation: Patient indicates she used to use Medicaid transportation to MD appointments. She recently lost Medicaid coverage. She indicates her sister's transmission needs to be repaired so she does not have transportation to appointments.  Informed patient that [facility] can provide short-term transportation to appointments. Long-term transportation arrangements will need to be determined. Patient verbalized understanding. [place], notified that transportation needed to appointment on 06/22/15. Motel address will be needed before transportation arrangements can be made. Will follow-up with patient regarding motel address after discharge.         Food/Nutrition: Patient indicates her sister shops for food. She indicates she has access to the food she needs.        Medications: Patient indicates she used to use [facility] for medications. She indicates she moved back to [place] two months ago and does not have a pharmacy she uses. Discussed pharmacy resources available at [place].  Patient verbalized understanding and indicates she plans to use [facility].        Identified Barriers: lack of insurance-Patient indicates she has reapplied for Medicaid. Discussed availability of Development worker, community at [place]. Also informed patient of Development worker, community walk-in  hours. Additional barriers include: lack of medication coverage, no PCP, social situation-living in a motel, lack of transportation to appointments        PCP: Patient indicates she was using Dr. Huel Coventry as her PCP but  indicates she is currently looking for a new PCP. May need to establish care at [place] after 30 days of medical management at [facility].              Arranged services:        Services communicated to Elenor Quinones, RN CM

## 2015-06-18 DIAGNOSIS — I4891 Unspecified atrial fibrillation: Secondary | ICD-10-CM

## 2015-06-18 DIAGNOSIS — Z720 Tobacco use: Secondary | ICD-10-CM

## 2015-06-18 LAB — GLUCOSE, CAPILLARY
GLUCOSE-CAPILLARY: 184 mg/dL — AB (ref 65–99)
Glucose-Capillary: 166 mg/dL — ABNORMAL HIGH (ref 65–99)
Glucose-Capillary: 268 mg/dL — ABNORMAL HIGH (ref 65–99)
Glucose-Capillary: 302 mg/dL — ABNORMAL HIGH (ref 65–99)

## 2015-06-18 LAB — BASIC METABOLIC PANEL
ANION GAP: 6 (ref 5–15)
BUN: 27 mg/dL — ABNORMAL HIGH (ref 6–20)
CALCIUM: 8.5 mg/dL — AB (ref 8.9–10.3)
CO2: 33 mmol/L — ABNORMAL HIGH (ref 22–32)
Chloride: 98 mmol/L — ABNORMAL LOW (ref 101–111)
Creatinine, Ser: 0.95 mg/dL (ref 0.44–1.00)
GFR calc Af Amer: >60 mL/min (ref >60)
Glucose, Bld: 171 mg/dL — ABNORMAL HIGH (ref 65–99)
POTASSIUM: 4 mmol/L (ref 3.5–5.1)
Sodium: 137 mmol/L (ref 135–145)

## 2015-06-18 MED ORDER — KETOROLAC TROMETHAMINE 15 MG/ML IJ SOLN
15.0000 mg | Freq: Four times a day (QID) | INTRAMUSCULAR | Status: DC | PRN
  Administered 2015-06-18 – 2015-06-19 (×3): 15 mg via INTRAVENOUS
  Filled 2015-06-18 (×3): qty 1

## 2015-06-18 MED ORDER — FUROSEMIDE 10 MG/ML IJ SOLN
60.0000 mg | Freq: Two times a day (BID) | INTRAMUSCULAR | Status: DC
  Administered 2015-06-18 – 2015-06-19 (×3): 60 mg via INTRAVENOUS
  Filled 2015-06-18 (×4): qty 6

## 2015-06-18 NOTE — Progress Notes (Signed)
[facility] PROGRESS NOTE  [name] [id number] DOB: [birth year] DOA: 06/14/2015 PCP: Kevan Ny, MD   Brief HPI: 54 year old Caucasian female with a past medical history of hypertension, hyperlipidemia, COPD, chronic combined diastolic and systolic CHF with a last known EF of 40% from an echocardiogram done in December 2015, diabetes mellitus type 2, atrial fibrillation. She stopped her anticoagulation on her own. She ran out of her diuretics a few days ago. She presented with complaints of shortness of breath and orthopnea. She was hospitalized for further management.  Assessment/Plan: Acute respiratory failure Secondary to congestive heart failure. Improving slowly.  Acute on chronic combined systolic and diastolic congestive heart failure Patient appears to be diuresis well. Mild bump in Cr noted. Continue to watch for now. Patient ran out of her diuretics a few days prior to admission. Her presentation is most likely due to noncompliance. Weight is up on this admission. Last known dry weight on June 19th, 2016 220 lbs (100kg). Wt today 102.2kg. Increase IV Lasix to 60mg  bid. Monitor ins and outs. Daily weights. No need to repeat another echocardiogram as her last one was in December 2015, which showed EF of 40-45%. Continue her ACE inhibitor. BUN is slowly trending upwards.  History of COPD Stable. No wheezing. Steroids were discontinued. She continues to be stable. Continue current home medications. Leukocytosis is secondary to steroids. Continue to watch for now.  History of atrial fibrillation Chads score is 5. She stopped anticoagulation on her own. Xarelto apparently was causing nausea and vomiting. She is open to being started on a new medication. The risks of stroke and thromboembolic events were discussed with the patient at the time of admission. She is followed by Dr. Gwenlyn Found. Consider possible Eliquis if renal function remains stable.  Elevated BUN/creatinine Possibly  secondary to diuresis. Repeat labs in the morning.  Diabetes mellitus type 2 with hyperglycemia Glycemic control has improved with cessation of steroids. Continue Lantus and sliding scale coverage. HbA1c is 8.7  Carotid artery disease Continue aspirin and Plavix. She is status post left carotid endarterectomy in April of this year.  Tobacco abuse Counseled to stop smoking.  Anemia of chronic disease Continue to monitor CBCs. Hemoglobin is stable.  History of polysubstance abuse in the past Denies any recent use.  Code Status: Full Family Communication: Pt in room Disposition Plan: Pending   Consultants:    Procedures:    Antibiotics:    HPI/Subjective: Complains of headache and low back pain and B feet pain  Objective: Filed Vitals:   06/17/15 2213 06/18/15 0523 06/18/15 0700 06/18/15 1300  BP:  132/58  137/64  Pulse:  81 81 79  Temp: 97.8 F (36.6 C) 97.3 F (36.3 C)  98 F (36.7 C)  TempSrc: Oral Oral  Oral  Resp:  18 18 18   Height:      Weight:  102.286 kg (225 lb 8 oz)    SpO2:  95% 97% 100%    Intake/Output Summary (Last 24 hours) at 06/18/15 1600 Last data filed at 06/18/15 1439  Gross per 24 hour  Intake   1404 ml  Output   1100 ml  Net    304 ml   Filed Weights   06/16/15 0453 06/17/15 0452 06/18/15 0523  Weight: 103.5 kg (228 lb 2.8 oz) 102.967 kg (227 lb) 102.286 kg (225 lb 8 oz)    Exam:   General:  Awake, laying in bed, in nad  Cardiovascular: regular, s1, s2  Respiratory: normal resp effort,  no wheezing  Abdomen: soft,obese, nondistended, pos BS  Musculoskeletal: perfused, no clubbing   Data Reviewed: Basic Metabolic Panel:  Recent Labs Lab 06/14/15 0500 06/15/15 0517 06/16/15 0327 06/17/15 0449 06/18/15 0359  NA 137 133* 135 138 137  K 4.3 4.6 3.5 3.8 4.0  CL 104 98* 96* 100* 98*  CO2 23 28 30 29  33*  GLUCOSE 289* 427* 182* 140* 171*  BUN 16 30* 41* 37* 27*  CREATININE 0.83 0.96 1.24* 0.97 0.95  CALCIUM 9.2  8.8* 9.1 8.5* 8.5*   Liver Function Tests: No results for input(s): AST, ALT, ALKPHOS, BILITOT, PROT, ALBUMIN in the last 168 hours. No results for input(s): LIPASE, AMYLASE in the last 168 hours. No results for input(s): AMMONIA in the last 168 hours. CBC:  Recent Labs Lab 06/14/15 0500 06/15/15 0517 06/17/15 0449  WBC 15.8* 15.2* 10.9*  NEUTROABS 13.1*  --   --   HGB 10.1* 9.8* 10.4*  HCT 31.4* 30.6* 32.3*  MCV 87.7 86.0 86.1  PLT 286 268 328   Cardiac Enzymes: No results for input(s): CKTOTAL, CKMB, CKMBINDEX, TROPONINI in the last 168 hours. BNP (last 3 results)  Recent Labs  04/29/15 1553 06/14/15 0500  BNP 1483.5* 1044.2*    ProBNP (last 3 results)  Recent Labs  09/07/14 0406 10/29/14 0222  PROBNP 1423.0* 1070.0*    CBG:  Recent Labs Lab 06/17/15 1109 06/17/15 1611 06/17/15 2022 06/18/15 0616 06/18/15 1117  GLUCAP 204* 199* 262* 166* 268*    No results found for this or any previous visit (from the past 240 hour(s)).   Studies: No results found.  Scheduled Meds: . aspirin EC  81 mg Oral Daily  . carvedilol  6.25 mg Oral BID WC  . clopidogrel  75 mg Oral Q breakfast  . enoxaparin (LOVENOX) injection  40 mg Subcutaneous Q24H  . furosemide  60 mg Intravenous BID  . gabapentin  400 mg Oral QID  . guaiFENesin  600 mg Oral BID  . hydrOXYzine  50 mg Oral QHS  . insulin aspart  0-20 Units Subcutaneous TID WC  . insulin glargine  40 Units Subcutaneous QHS  . levalbuterol  0.63 mg Nebulization TID  . levothyroxine  50 mcg Oral QAC breakfast  . lisinopril  20 mg Oral Daily  . pantoprazole  40 mg Oral Daily  . pravastatin  80 mg Oral q1800  . sodium chloride  3 mL Intravenous Q12H   Continuous Infusions:   Principal Problem:   Acute respiratory failure Active Problems:   Alcohol abuse   Alcoholic cirrhosis   Cigarette smoker   DM (diabetes mellitus), type 2 with peripheral vascular complications   Atrial fibrillation with RVR   Carotid  arterial disease   COPD (chronic obstructive pulmonary disease)   Acute on chronic combined systolic and diastolic heart failure   CHF exacerbation    CHIU, STEPHEN K  [facility] Pager [phone]. If 7PM-7AM, please contact night-coverage at [email or web address], password [facility] 06/18/2015, 4:00 PM  LOS: 4 days

## 2015-06-18 NOTE — Progress Notes (Signed)
UR Completed. Deshannon Seide, RN, BSN.  [phone] 

## 2015-06-18 NOTE — Hospital Discharge Follow-Up (Signed)
This Case Manager spoke with patient to remind her of [facility] appointment on 06/22/15 at 36 with Dr. Jarold Song. Patient inquired if patient could be moved to later in the week. Appointment rescheduled to 06/25/15 at 1200 with Dr. Jarold Song.  Appointment updated on AVS. Patient will need transportation (cab) to appointment on 06/25/15. Patient currently living in a motel and uncertain of address at this time. Will need to determine address after discharge so cab can be arranged. [place], aware.  Patient aware of new appointment and appreciative of new appointment time.  Communication sent to Elenor Quinones, RN CM updating her on patient's new appointment time.

## 2015-06-19 ENCOUNTER — Ambulatory Visit: Payer: Medicaid Other | Admitting: Cardiovascular Disease

## 2015-06-19 LAB — BASIC METABOLIC PANEL
Anion gap: 9 (ref 5–15)
BUN: 32 mg/dL — ABNORMAL HIGH (ref 6–20)
CALCIUM: 8 mg/dL — AB (ref 8.9–10.3)
CHLORIDE: 95 mmol/L — AB (ref 101–111)
CO2: 29 mmol/L (ref 22–32)
Creatinine, Ser: 1.1 mg/dL — ABNORMAL HIGH (ref 0.44–1.00)
GFR calc Af Amer: >60 mL/min (ref >60)
GFR, EST NON AFRICAN AMERICAN: 57 mL/min — AB (ref >60)
GLUCOSE: 241 mg/dL — AB (ref 65–99)
Potassium: 4.4 mmol/L (ref 3.5–5.1)
Sodium: 133 mmol/L — ABNORMAL LOW (ref 135–145)

## 2015-06-19 LAB — GLUCOSE, CAPILLARY
Glucose-Capillary: 185 mg/dL — ABNORMAL HIGH (ref 65–99)
Glucose-Capillary: 304 mg/dL — ABNORMAL HIGH (ref 65–99)

## 2015-06-19 MED ORDER — FUROSEMIDE 40 MG PO TABS: 60.0000 mg | ORAL_TABLET | Freq: Two times a day (BID) | ORAL | Status: DC

## 2015-06-19 NOTE — Discharge Summary (Signed)
Physician Discharge Summary  [name] [id number] DOB: 09/15/1961 DOA: 06/14/2015  PCP: Kevan Ny, MD  Admit date: 06/14/2015 Discharge date: 06/19/2015  Time spent: 20 minutes  Weight On Discharge: 102.2kg  Recommendations for Outpatient Follow-up:  1. Please check renal panel in 1-2 weeks  Discharge Diagnoses:  Principal Problem:   Acute respiratory failure Active Problems:   Alcohol abuse   Alcoholic cirrhosis   Cigarette smoker   DM (diabetes mellitus), type 2 with peripheral vascular complications   Atrial fibrillation with RVR   Carotid arterial disease   COPD (chronic obstructive pulmonary disease)   Acute on chronic combined systolic and diastolic heart failure   CHF exacerbation   Discharge Condition: Improved  Diet recommendation: Heart healthy, diabetic  Filed Weights   06/17/15 0452 06/18/15 0523 06/19/15 0621  Weight: 102.967 kg (227 lb) 102.286 kg (225 lb 8 oz) 102.422 kg (225 lb 12.8 oz)    History of present illness:  Please see admit h and p from 7/31 for details. Briefly, pt presented with complaints of sob with 3 pillow orthopnea, found to have evidence of pulm edema. The patient was admitted for further workup.  Hospital Course:  Acute respiratory failure Likely secondary to congestive heart failure. Improved.  Acute on chronic combined systolic and diastolic congestive heart failure Patient appeared to be diuresis well. Patient reportedly ran out of her diuretics a few days prior to admission. Her presentation is most likely due to noncompliance. Weight was up on this admission. Last known dry weight on June 19th, 2016 220 lbs (100kg). Wt on discharge 102.2kg. No need to repeat another echocardiogram as her last one was in December 2015, which showed EF of 40-45%. Continue her ACE inhibitor. BUN and Cr slowly trended upwards, thus lasix was transitioned back to PO on discharge  History of COPD Stable. No wheezing. Steroids were discontinued.  She continues to be stable. Continue current home medications. Leukocytosis is secondary to steroids.  History of atrial fibrillation Chads score is 5. She stopped anticoagulation on her own. Patient believed Xarelto was causing nausea and vomiting. This is not a common side effect of the medication. Pt was strongly advised to resume Xarelto given risk of stroke. Pt advised to follow up with Dr. Gwenlyn Found, her primary Cardiologist  Elevated BUN/creatinine Possibly secondary to diuresis. Follow renal panel in 1-2 weeks post-discharge  Diabetes mellitus type 2 with hyperglycemia Glycemic control has improved with cessation of steroids. Continue Lantus and sliding scale coverage. HbA1c is 8.7  Carotid artery disease Continue aspirin and Plavix. She is status post left carotid endarterectomy in April of this year.  Tobacco abuse Counseled to stop smoking.  Anemia of chronic disease Continue to monitor CBCs. Hemoglobin is stable.  History of polysubstance abuse in the past Denies any recent use.  Discharge Exam: Filed Vitals:   06/18/15 2056 06/19/15 0621 06/19/15 0710 06/19/15 1002  BP: 145/74 117/68  122/85  Pulse: 80 67    Temp: 98.7 F (37.1 C) 98.6 F (37 C)    TempSrc: Oral Oral    Resp: 18 18    Height:      Weight:  102.422 kg (225 lb 12.8 oz)    SpO2: 100% 96% 100%     General: Awake, in nad Cardiovascular: regular, s1, s2 Respiratory: normal resp effort, no wheezing  Discharge Instructions     Medication List    STOP taking these medications        aspirin 81 MG tablet  oxyCODONE 5 MG immediate release tablet  Commonly known as:  ROXICODONE     PRESCRIPTION MEDICATION      TAKE these medications        albuterol (2.5 MG/3ML) 0.083% nebulizer solution  Commonly known as:  PROVENTIL  Take 2.5 mg by nebulization every 6 (six) hours as needed. For wheezing.     carvedilol 6.25 MG tablet  Commonly known as:  COREG  Take 1 tablet (6.25 mg total) by  mouth 2 (two) times daily with a meal.     clopidogrel 75 MG tablet  Commonly known as:  PLAVIX  Take 1 tablet (75 mg total) by mouth daily with breakfast.     furosemide 40 MG tablet  Commonly known as:  LASIX  Take 1.5 tablets (60 mg total) by mouth 2 (two) times daily.     gabapentin 400 MG capsule  Commonly known as:  NEURONTIN  Take 400 mg by mouth 4 (four) times daily.     hydrOXYzine 50 MG tablet  Commonly known as:  ATARAX/VISTARIL  Take 50 mg by mouth at bedtime. For sleep     insulin aspart 100 UNIT/ML injection  Commonly known as:  novoLOG  Inject 7-10 Units into the skin 3 (three) times daily with meals. Sliding scale, usually takes 10 units in am, 7 units in pm     insulin glargine 100 UNIT/ML injection  Commonly known as:  LANTUS  Inject 0.4 mLs (40 Units total) into the skin at bedtime.     ipratropium 17 MCG/ACT inhaler  Commonly known as:  ATROVENT HFA  Inhale 2 puffs into the lungs 2 (two) times daily.     levothyroxine 50 MCG tablet  Commonly known as:  SYNTHROID, LEVOTHROID  Take 50 mcg by mouth daily.     lisinopril 20 MG tablet  Commonly known as:  PRINIVIL,ZESTRIL  Take 20 mg by mouth daily.     metFORMIN 1000 MG tablet  Commonly known as:  GLUCOPHAGE  Take 1,000 mg by mouth 2 (two) times daily with a meal.     nitroGLYCERIN 0.4 MG SL tablet  Commonly known as:  NITROSTAT  Place 1 tablet (0.4 mg total) under the tongue every 5 (five) minutes as needed for chest pain.     pantoprazole 40 MG tablet  Commonly known as:  PROTONIX  Take 40 mg by mouth daily.     pravastatin 80 MG tablet  Commonly known as:  PRAVACHOL  Take 1 tablet (80 mg total) by mouth daily at 6 PM.     rivaroxaban 20 MG Tabs tablet  Commonly known as:  XARELTO  Take 20 mg by mouth daily with supper.       Allergies  Allergen Reactions  . Amiodarone Nausea And Vomiting       Follow-up Information    Follow up with [facility]     On  06/25/2015.   Why:  [facility] appointment on 06/25/15 at 12:00 pm with Dr. Jarold Song.   Contact information:   [address] [place] [phone]      Schedule an appointment as soon as possible for a visit with Quay Burow, MD.   Specialties:  Cardiology, Radiology   Why:  Hospital follow up   Contact information:   [address] [place] [address] [phone]        The results of significant diagnostics from this hospitalization (including imaging, microbiology, ancillary  and laboratory) are listed below for reference.    Significant Diagnostic Studies: Dg Chest 2 View  06/14/2015   CLINICAL DATA:  Dyspnea. Cough, shortness of breath, nausea and headache.  EXAM: CHEST  2 VIEW  COMPARISON:  04/29/2015  FINDINGS: Lung volumes are low. Heart remains at the upper limits of normal in size. Prominent interstitial opacities suspicious for pulmonary edema, similar to prior exam. Small right pleural effusion and fluid in the right minor fissure. Mild atelectasis at the right lung base. No consolidation to suggest pneumonia. No pneumothorax. No acute osseous abnormality is seen.  IMPRESSION: 1. Stable borderline cardiomegaly and interstitial prominence suspicious for pulmonary edema. 2. Small right pleural effusion, new.   Electronically Signed   By: Jeb Levering M.D.   On: 06/14/2015 05:43    Microbiology: No results found for this or any previous visit (from the past 240 hour(s)).   Labs: Basic Metabolic Panel:  Recent Labs Lab 06/15/15 0517 06/16/15 0327 06/17/15 0449 06/18/15 0359 06/19/15 0414  NA 133* 135 138 137 133*  K 4.6 3.5 3.8 4.0 4.4  CL 98* 96* 100* 98* 95*  CO2 28 30 29  33* 29  GLUCOSE 427* 182* 140* 171* 241*  BUN 30* 41* 37* 27* 32*  CREATININE 0.96 1.24* 0.97 0.95 1.10*  CALCIUM 8.8* 9.1 8.5* 8.5* 8.0*   Liver Function Tests: No results for input(s): AST, ALT, ALKPHOS, BILITOT, PROT,  ALBUMIN in the last 168 hours. No results for input(s): LIPASE, AMYLASE in the last 168 hours. No results for input(s): AMMONIA in the last 168 hours. CBC:  Recent Labs Lab 06/14/15 0500 06/15/15 0517 06/17/15 0449  WBC 15.8* 15.2* 10.9*  NEUTROABS 13.1*  --   --   HGB 10.1* 9.8* 10.4*  HCT 31.4* 30.6* 32.3*  MCV 87.7 86.0 86.1  PLT 286 268 328   Cardiac Enzymes: No results for input(s): CKTOTAL, CKMB, CKMBINDEX, TROPONINI in the last 168 hours. BNP: BNP (last 3 results)  Recent Labs  04/29/15 1553 06/14/15 0500  BNP 1483.5* 1044.2*    ProBNP (last 3 results)  Recent Labs  09/07/14 0406 10/29/14 0222  PROBNP 1423.0* 1070.0*    CBG:  Recent Labs Lab 06/18/15 1117 06/18/15 1625 06/18/15 2040 06/19/15 0617 06/19/15 1135  GLUCAP 268* 302* 184* 185* 304*    Signed:  Silver Parkey K  [facility] 06/19/2015, 12:07 PM

## 2015-06-19 NOTE — Care Management Note (Addendum)
Case Management Note  Patient Details  Name: [name] MRN: [id number] Date of Birth: 02-27-1961  Subjective/Objective:   Pt admitted with acute respiratory failure                 Action/Plan:  Pt is independent from home.  Pt has recently had her medicaid cancelled and pt is actively looking for a PCP.  CM will assist and will continue to monitor for disposition needs   Expected Discharge Date:                  Expected Discharge Plan:  Home/Self Care  In-House Referral:  Financial Counselor  Discharge planning Services  CM Consult  Post Acute Care Choice:    Choice offered to:     DME Arranged:    DME Agency:     HH Arranged:    [place] Agency:     Status of Service:  Complete, will sign off  Medicare Important Message Given:  No Date Medicare IM Given:    Medicare IM give by:    Date Additional Medicare IM Given:    Additional Medicare Important Message give by:     If discussed at [place] of Stay Meetings, dates discussed:    Additional Comments: 06/19/15 Elenor Quinones, RN, BSN [phone] Pt will discharge today with sister traveling to hotel until permanent address can be established.  CM verified pts appointment with clinic, pt changed appointment time with clinic.  CM spoke with [facility] and was informed that pts medication could be filled on day of discharge on the medication assistance program, no need for [facility] letter.  Per pt; she already has enough Xeralto medication (pt on medication prior to admit) for the rest of the month, pt instructed to provide discharge instructions with current medications to clinic for medication assistance.  No CM needs.  06/16/15 Elenor Quinones, RN, BSN [phone] Pt agreed to [facility] making appointment at [facility].  CM set up post discharge appt for 06/22/15 at 10:30 am.  Clinic Brochure provided to pt, pt informed to request medication assistance during intal appointment, pt also informed that she can get medications filled at clinic  starting discharge date.   Maryclare Labrador, RN 06/19/2015, 1:46 PM

## 2015-06-19 NOTE — Progress Notes (Signed)
06/19/2015 1:44 PM Discharge AVS meds taken today and those due this evening reviewed.  Follow-up appointments and when to call md reviewed.  D/C IV and TELE.  Questions and concerns addressed.   D/C home per orders. [name]

## 2015-06-19 NOTE — Progress Notes (Signed)
06/19/2015 10:57 AM Pt's O2 saturation on RA sitting in room are 100%.  Pt ambulated 276ft on RA and maintain saturation of 97-99%. [name]

## 2015-06-22 ENCOUNTER — Inpatient Hospital Stay: Payer: Medicaid Other | Admitting: Family Medicine

## 2015-06-22 ENCOUNTER — Encounter (HOSPITAL_COMMUNITY): Payer: Self-pay | Admitting: Emergency Medicine

## 2015-06-22 ENCOUNTER — Inpatient Hospital Stay (HOSPITAL_COMMUNITY)
Admission: EM | Admit: 2015-06-22 | Discharge: 2015-06-25 | DRG: 309 | Disposition: A | Discharge status: Home / Self Care | Payer: Medicaid Other | Attending: Internal Medicine | Admitting: Internal Medicine

## 2015-06-22 ENCOUNTER — Telehealth: Payer: Self-pay

## 2015-06-22 DIAGNOSIS — I4891 Unspecified atrial fibrillation: Secondary | ICD-10-CM | POA: Diagnosis present

## 2015-06-22 DIAGNOSIS — R002 Palpitations: Secondary | ICD-10-CM

## 2015-06-22 DIAGNOSIS — I480 Paroxysmal atrial fibrillation: Principal | ICD-10-CM | POA: Diagnosis present

## 2015-06-22 DIAGNOSIS — Z888 Allergy status to other drugs, medicaments and biological substances status: Secondary | ICD-10-CM

## 2015-06-22 DIAGNOSIS — F17210 Nicotine dependence, cigarettes, uncomplicated: Secondary | ICD-10-CM | POA: Diagnosis present

## 2015-06-22 DIAGNOSIS — I482 Chronic atrial fibrillation: Secondary | ICD-10-CM | POA: Diagnosis present

## 2015-06-22 DIAGNOSIS — I5042 Chronic combined systolic (congestive) and diastolic (congestive) heart failure: Secondary | ICD-10-CM | POA: Diagnosis present

## 2015-06-22 DIAGNOSIS — E669 Obesity, unspecified: Secondary | ICD-10-CM | POA: Diagnosis present

## 2015-06-22 DIAGNOSIS — I472 Ventricular tachycardia: Secondary | ICD-10-CM | POA: Diagnosis present

## 2015-06-22 DIAGNOSIS — D649 Anemia, unspecified: Secondary | ICD-10-CM | POA: Diagnosis present

## 2015-06-22 DIAGNOSIS — I481 Persistent atrial fibrillation: Secondary | ICD-10-CM | POA: Diagnosis present

## 2015-06-22 DIAGNOSIS — I10 Essential (primary) hypertension: Secondary | ICD-10-CM | POA: Diagnosis present

## 2015-06-22 DIAGNOSIS — J449 Chronic obstructive pulmonary disease, unspecified: Secondary | ICD-10-CM | POA: Diagnosis present

## 2015-06-22 DIAGNOSIS — Z794 Long term (current) use of insulin: Secondary | ICD-10-CM

## 2015-06-22 DIAGNOSIS — Z9119 Patient's noncompliance with other medical treatment and regimen: Secondary | ICD-10-CM | POA: Diagnosis present

## 2015-06-22 DIAGNOSIS — E1151 Type 2 diabetes mellitus with diabetic peripheral angiopathy without gangrene: Secondary | ICD-10-CM | POA: Diagnosis present

## 2015-06-22 DIAGNOSIS — I429 Cardiomyopathy, unspecified: Secondary | ICD-10-CM | POA: Diagnosis present

## 2015-06-22 DIAGNOSIS — I6529 Occlusion and stenosis of unspecified carotid artery: Secondary | ICD-10-CM | POA: Diagnosis present

## 2015-06-22 DIAGNOSIS — R0602 Shortness of breath: Secondary | ICD-10-CM

## 2015-06-22 DIAGNOSIS — Z7902 Long term (current) use of antithrombotics/antiplatelets: Secondary | ICD-10-CM

## 2015-06-22 DIAGNOSIS — Z79899 Other long term (current) drug therapy: Secondary | ICD-10-CM

## 2015-06-22 DIAGNOSIS — N390 Urinary tract infection, site not specified: Secondary | ICD-10-CM | POA: Diagnosis present

## 2015-06-22 DIAGNOSIS — I248 Other forms of acute ischemic heart disease: Secondary | ICD-10-CM | POA: Diagnosis present

## 2015-06-22 DIAGNOSIS — M1990 Unspecified osteoarthritis, unspecified site: Secondary | ICD-10-CM | POA: Diagnosis present

## 2015-06-22 DIAGNOSIS — K219 Gastro-esophageal reflux disease without esophagitis: Secondary | ICD-10-CM | POA: Diagnosis present

## 2015-06-22 DIAGNOSIS — Z7901 Long term (current) use of anticoagulants: Secondary | ICD-10-CM

## 2015-06-22 DIAGNOSIS — E1165 Type 2 diabetes mellitus with hyperglycemia: Secondary | ICD-10-CM | POA: Diagnosis present

## 2015-06-22 DIAGNOSIS — E039 Hypothyroidism, unspecified: Secondary | ICD-10-CM | POA: Diagnosis present

## 2015-06-22 DIAGNOSIS — I2510 Atherosclerotic heart disease of native coronary artery without angina pectoris: Secondary | ICD-10-CM | POA: Diagnosis present

## 2015-06-22 DIAGNOSIS — Z8249 Family history of ischemic heart disease and other diseases of the circulatory system: Secondary | ICD-10-CM

## 2015-06-22 DIAGNOSIS — E785 Hyperlipidemia, unspecified: Secondary | ICD-10-CM | POA: Diagnosis present

## 2015-06-22 DIAGNOSIS — Z833 Family history of diabetes mellitus: Secondary | ICD-10-CM

## 2015-06-22 DIAGNOSIS — E1142 Type 2 diabetes mellitus with diabetic polyneuropathy: Secondary | ICD-10-CM | POA: Diagnosis present

## 2015-06-22 NOTE — ED Notes (Signed)
Pt presents with [facility] for CP and SOB; EMS reports patient was in SVT (HR 170-220 and irreg) upon their arrival on scene and 6mg  adenosine was given without improvement; EMS gave 20mg  cardizem bolus with improvement (HR 110-150); pt reports CP and SOB now gone; pt reports hx of afib, copd, and chf and has not taken medication x 1 wk; pt CAOx4 at this time

## 2015-06-22 NOTE — ED Provider Notes (Signed)
CSN: [id number]     Arrival date & time 06/22/15  2339 History  This chart was scribed for Evelina Bucy, MD by Randa Evens, ED Scribe. This patient was seen in room B14C/B14C and the patient's care was started at 11:47 AM.      Chief Complaint  Patient presents with  . Chest Pain  . Shortness of Breath  . Irregular Heart Beat   Patient is a 54 y.o. female presenting with chest pain and shortness of breath. The history is provided by the patient. No language interpreter was used.  Chest Pain Pain radiates to:  Does not radiate Pain radiates to the back: no   Pain severity:  Mild Duration:  1 hour Progression:  Improving Context: stress   Relieved by: cardizem. Associated symptoms: headache, palpitations and shortness of breath   Associated symptoms: no cough, no fever, no nausea and not vomiting   Risk factors: hypertension and obesity   Shortness of Breath Associated symptoms: chest pain and headaches   Associated symptoms: no cough, no fever and no vomiting    HPI Comments: LEXEE BOLDON is a 54 y.o. female with PMHx listed below who presents to the Emergency Department complaining of improving CP onset 1 hour PTA. Pt does report possible stressors during the onset of pain. Pt states she has associated palpitations, SOB, and HA. Pt states she has a Hx of A-Fib. Pt received Cardizem in route PTA that has provided some relief of the CP. Pt states she has been complaint with taking all her medications. Denies vomiting cough, fever, dysuria or difficulty urinating.   Past Medical History  Diagnosis Date  . Alcohol abuse   . Narcotic abuse   . Cocaine abuse   . Marijuana abuse   . Tobacco abuse   . Alcoholic cirrhosis   . Cardiomyopathy   . Obesity   . CHF (congestive heart failure)   . Hypothyroidism   . GERD (gastroesophageal reflux disease)   . Atrial fibrillation   . Critical lower limb ischemia   . Hypertension   . CAD (coronary artery disease)     a. cath 11/10/2014  small vessel CAD. Demand ischemia in the setting of rapid a-fib  . Pneumonia     END MARCH 2016  . Anxiety   . Wrist fracture     LEFT; "just did xray and cast"  . DOE (dyspnea on exertion) 04/29/2015  . Poorly controlled type 2 diabetes mellitus   . Hyperlipemia   . Peripheral arterial disease     a. 01/2015 Angio/PTA: LSFA 100 w/ recon @ adductor canal and 1 vessel runoff via AT, RSFA 99 (atherectomy/pta) - 1 vessel runoff via diff dzs peroneal.  . Carotid artery disease     a. 01/2015 Carotid Angio: RICA [id number], LICA [id number].  [name] COPD (chronic obstructive pulmonary disease)   . Arthritis     "hands, arms, shoulders, legs, back" (04/29/2015)  . Diabetic peripheral neuropathy    Past Surgical History  Procedure Laterality Date  . Cardioversion  ~ 02/2013    "twice"   . Lower extremity angiogram N/A 09/10/2013    Procedure: LOWER EXTREMITY ANGIOGRAM;  Surgeon: [name], MD;  Location: [facility] CATH LAB;  Service: Cardiovascular;  Laterality: N/A;  . Left heart catheterization with coronary angiogram N/A 10/31/2014    Procedure: LEFT HEART CATHETERIZATION WITH CORONARY ANGIOGRAM;  Surgeon: Burnell Blanks, MD;  Location: [facility] CATH LAB;  Service: Cardiovascular;  Laterality: N/A;  . Peripheral athrectomy Right  01/15/2015    SFA/notes 01/15/2015  . Balloon angioplasty, artery Right 01/15/2015    SFA/notes 01/15/2015  . Cardiac catheterization    . Lower extremity angiogram N/A 01/15/2015    Procedure: LOWER EXTREMITY ANGIOGRAM;  Surgeon: [name], MD;  Location: [facility] CATH LAB;  Service: Cardiovascular;  Laterality: N/A;  . Carotid angiogram N/A 01/15/2015    Procedure: CAROTID ANGIOGRAM;  Surgeon: [name], MD;  Location: [facility] CATH LAB;  Service: Cardiovascular;  Laterality: N/A;  . Endarterectomy Left 02/19/2015    Procedure: LEFT CAROTID ENDARTERECTOMY ;  Surgeon: Serafina Mitchell, MD;  Location: [facility] OR;  Service: Vascular;  Laterality: Left;  . Dilation and curettage of uterus  1988    Family History  Problem Relation Age of Onset  . Hypertension Mother   . Diabetes Mother   . Cancer Mother     breast, ovarian, colon  . Clotting disorder Mother   . Heart disease Mother   . Heart attack Mother   . Hypertension Father   . Heart disease Father   . Emphysema Sister     smoked   History  Substance Use Topics  . Smoking status: Current Every Day Smoker -- 1.00 packs/day for 40 years    Types: Cigarettes    Last Attempt to Quit: 02/19/2015  . Smokeless tobacco: Never Used  . Alcohol Use: 0.0 oz/week    0 Standard drinks or equivalent per week     Comment: 04/29/2015 "might have a mixed drink maybe once/month, or less"   OB History    Gravida Para Term Preterm AB TAB SAB Ectopic Multiple Living   1 1 1       1      Review of Systems  Constitutional: Negative for fever.  Respiratory: Positive for shortness of breath. Negative for cough.   Cardiovascular: Positive for chest pain and palpitations.  Gastrointestinal: Negative for nausea and vomiting.  Neurological: Positive for headaches.  All other systems reviewed and are negative.    Allergies  Amiodarone  Home Medications   Prior to Admission medications   Medication Sig Start Date End Date Taking? Authorizing Provider  albuterol (PROVENTIL HFA;VENTOLIN HFA) 108 (90 BASE) MCG/ACT inhaler Inhale 2 puffs into the lungs every 6 (six) hours as needed for wheezing or shortness of breath.   Yes Historical Provider, MD  albuterol (PROVENTIL) (2.5 MG/3ML) 0.083% nebulizer solution Take 2.5 mg by nebulization every 6 (six) hours as needed. For wheezing.   Yes Historical Provider, MD  carvedilol (COREG) 6.25 MG tablet Take 1 tablet (6.25 mg total) by mouth 2 (two) times daily with a meal. 02/05/15  Yes [name], MD  clopidogrel (PLAVIX) 75 MG tablet Take 1 tablet (75 mg total) by mouth daily with breakfast. 01/16/15  Yes Rogelia Mire, NP  furosemide (LASIX) 40 MG tablet Take 1.5 tablets (60 mg total)  by mouth 2 (two) times daily. 06/19/15  Yes Donne Hazel, MD  gabapentin (NEURONTIN) 400 MG capsule Take 400 mg by mouth 4 (four) times daily.   Yes Historical Provider, MD  glipiZIDE (GLUCOTROL XL) 10 MG 24 hr tablet Take 30 mg by mouth daily with breakfast.   Yes Historical Provider, MD  insulin aspart (NOVOLOG) 100 UNIT/ML injection Inject 7-10 Units into the skin 3 (three) times daily with meals. Sliding scale, usually takes 10 units in am, 7 units in pm   Yes Historical Provider, MD  insulin glargine (LANTUS) 100 UNIT/ML injection Inject 0.4 mLs (40  Units total) into the skin at bedtime. 05/03/15  Yes Nishant Dhungel, MD  levothyroxine (SYNTHROID, LEVOTHROID) 50 MCG tablet Take 50 mcg by mouth daily.   Yes Historical Provider, MD  lisinopril (PRINIVIL,ZESTRIL) 20 MG tablet Take 20 mg by mouth daily.   Yes Historical Provider, MD  metFORMIN (GLUCOPHAGE) 1000 MG tablet Take 1,000 mg by mouth 2 (two) times daily with a meal.   Yes Historical Provider, MD  nitroGLYCERIN (NITROSTAT) 0.4 MG SL tablet Place 1 tablet (0.4 mg total) under the tongue every 5 (five) minutes as needed for chest pain. 01/16/15  Yes Rogelia Mire, NP  pantoprazole (PROTONIX) 40 MG tablet Take 40 mg by mouth daily. 02/13/15 02/13/16 Yes Historical Provider, MD  pravastatin (PRAVACHOL) 80 MG tablet Take 1 tablet (80 mg total) by mouth daily at 6 PM. Patient taking differently: Take 40 mg by mouth daily at 6 PM.  04/01/15  Yes [name], MD  rivaroxaban (XARELTO) 20 MG TABS tablet Take 20 mg by mouth daily with supper.   Yes Historical Provider, MD   BP 100/56 mmHg  Pulse 57  Temp(Src) 98 F (36.7 C) (Oral)  Resp 16  Wt 220 lb (99.791 kg)  SpO2 99%  LMP 11/27/2012   Physical Exam  Constitutional: She is oriented to person, place, and time. She appears well-developed and well-nourished. No distress.  HENT:  Head: Normocephalic and atraumatic.  Mouth/Throat: Oropharynx is clear and moist.  Eyes: EOM are normal.  Pupils are equal, round, and reactive to light.  Neck: Normal range of motion. Neck supple.  Cardiovascular: An irregularly irregular rhythm present. Tachycardia present.  Exam reveals no friction rub.   No murmur heard. Pulmonary/Chest: Effort normal and breath sounds normal. No respiratory distress. She has no wheezes. She has no rales.  Abdominal: Soft. She exhibits no distension. There is no tenderness. There is no rebound.  Musculoskeletal: Normal range of motion. She exhibits no edema.  Neurological: She is alert and oriented to person, place, and time.  Skin: She is not diaphoretic.  Nursing note and vitals reviewed.   ED Course  Procedures (including critical care time) DIAGNOSTIC STUDIES: Oxygen Saturation is 97% on RA, normal by my interpretation.    COORDINATION OF CARE: 12:04 AM-Discussed treatment plan with pt at bedside and pt agreed to plan.     Labs Review Labs Reviewed  BASIC METABOLIC PANEL - Abnormal; Notable for the following:    Glucose, Bld 245 (*)    Creatinine, Ser 1.02 (*)    Calcium 8.8 (*)    All other components within normal limits  CBC - Abnormal; Notable for the following:    WBC 11.1 (*)    RBC 3.72 (*)    Hemoglobin 10.5 (*)    HCT 32.6 (*)    All other components within normal limits  PROTIME-INR - Abnormal; Notable for the following:    Prothrombin Time 21.9 (*)    INR 1.92 (*)    All other components within normal limits  URINALYSIS, ROUTINE W REFLEX MICROSCOPIC (NOT AT [facility]) - Abnormal; Notable for the following:    APPearance CLOUDY (*)    Hgb urine dipstick SMALL (*)    Protein, ur 100 (*)    Leukocytes, UA TRACE (*)    All other components within normal limits  BRAIN NATRIURETIC PEPTIDE - Abnormal; Notable for the following:    B Natriuretic Peptide 539.4 (*)    All other components within normal limits  URINE MICROSCOPIC-ADD ON -  Abnormal; Notable for the following:    Bacteria, UA MANY (*)    All other components within  normal limits  TSH  I-STAT TROPOININ, ED    Imaging Review Dg Chest 2 View  06/23/2015   CLINICAL DATA:  Shortness of breath and chest pain  EXAM: CHEST  2 VIEW  COMPARISON:  June 14, 2015  FINDINGS: There is no edema or consolidation. Heart is upper normal in size with pulmonary vascularity within normal limits. No adenopathy. No bone lesions.  IMPRESSION: No edema or consolidation.   Electronically Signed   By: Lowella Grip III M.D.   On: 06/23/2015 01:24     EKG Interpretation   Date/Time:  Monday June 22 2015 23:51:39 EDT Ventricular Rate:  136 PR Interval:    QRS Duration: 89 QT Interval:  306 QTC Calculation: 460 R Axis:   25 Text Interpretation:  Atrial fibrillation Abnormal R-wave progression,  early transition Nonspecific repol abnormality, diffuse leads Similar to  prior EKGs Confirmed by Mingo Amber  MD, Misako Roeder ([id number]) on 06/22/2015 11:56:39 PM      CRITICAL CARE Performed by: Osvaldo Shipper   Total critical care time: 30 minutes  Critical care time was exclusive of separately billable procedures and treating other patients.  Critical care was necessary to treat or prevent imminent or life-threatening deterioration.  Critical care was time spent personally by me on the following activities: development of treatment plan with patient and/or surrogate as well as nursing, discussions with consultants, evaluation of patient's response to treatment, examination of patient, obtaining history from patient or surrogate, ordering and performing treatments and interventions, ordering and review of laboratory studies, ordering and review of radiographic studies, pulse oximetry and re-evaluation of patient's condition.  MDM   Final diagnoses:  Atrial fibrillation with RVR  Palpitations  Shortness of breath   54 year old female with history of A. Fib, CHF presents with shortness of breath and chest pain. She also had some heart palpitations. Found to be in A. fib with  RVR with EMS. Given Cardizem with increase in rape did not convert to sinus and here rates in the 140s to 150s. She is normotensive. She was recently admitted for CHF exacerbation. She does report she taken her medicines and she is on her Xarelto. We'll give Cardizem and fluids and check labs. Labs similar to prior. No conversion to sinus rhythm and no control of HR with maximal doses of Cardizem. Admitted to stepdown.  BNP has trended down from around 1000 to around 500.  I personally performed the services described in this documentation, which was scribed in my presence. The recorded information has been reviewed and is accurate.       Evelina Bucy, MD 06/23/15 [phone]

## 2015-06-22 NOTE — Telephone Encounter (Signed)
[facility] Post-discharge Follow-Up Phone Call:  Date of Discharge: 06/19/2015 Principal Discharge Diagnosis(es): Acute respiratory failure.  Post-discharge Communication: Spoke to the patient, who said that the best # to reach her is # [phone].  She said that # [phone] ( the number that CM reached the patient today) will soon be cut off. The CM had called # [phone], the number that the patient had provided for her friend, Katori, was called an a voice mail message was left requesting a call back to # [phone] or [phone].  Call Completed: Yes                    With Whom: Patient Interpreter Needed: No             Language/Dialect: English     Please check all that app ? Patient is knowledgeable of his/her condition(s) and/or treatment. X Patient is caring for self at home.  - She is currently been living [facility] w/ her sister and friend for 2 months.  Room 122. ? Patient is receiving assist at home from family and/or caregiver. Family and/or caregiver is knowledgeable of patient's condition(s) and/or treatment. ? Patient is receiving home health services. If so, name of agency.     Medication Reconciliation:  X Medication list reviewed with patient. X Patient obtained all discharge medications. If not, why? At first the patient said that she did not have all of her medications yet when they were reviewed, she said that she has them in a pill box that is set up for the week and they are all in the box. She said that she will need refills on many of the medications and will bring all of the pill bottles, including the empty bottles and the med box , to her appointment on 06/25/15. She noted that her sister or her niece fill the med box for her. She said that the only medication that she needs now is gabapentin and she will discuss w/ the MD on 06/25/15.  She said that she has her nebulizer and inhalers and xarelto. She noted that she has not had  to use the nebulizer since prior to her hospitalization.    Activities of Daily Living:  X Independent ? Needs assist (describe; ? home DME used) ? Total [facility] (describe, ? home DME used)   Community resources in place for patient:  ? None  ? Home Health/Home DME - no home care services at this time.  ? Assisted Living ? Support Group          Patient Education: Instructed about the pharmacy and financial counseling services available at [facility].  She said that has applied for medicaid again. Discussed the resources for obtaining food and she said that she is aware of the options for food pantries in the area.         Questions/Concerns discussed:No other problems/ concerns reported The patient confirmed her appointment for 06/25/15 @1200  w/ Dr Jarold Song. She said that she has already spoken to Rolanda Lundborg, scheduler regarding transportation to her appointment and she said that Lorna Few will call her the day prior to the appointment to confirm her transportation.

## 2015-06-23 ENCOUNTER — Emergency Department (HOSPITAL_COMMUNITY): Payer: Medicaid Other

## 2015-06-23 ENCOUNTER — Telehealth: Payer: Self-pay

## 2015-06-23 ENCOUNTER — Encounter (HOSPITAL_COMMUNITY): Payer: Self-pay | Admitting: Internal Medicine

## 2015-06-23 DIAGNOSIS — I248 Other forms of acute ischemic heart disease: Secondary | ICD-10-CM | POA: Diagnosis present

## 2015-06-23 DIAGNOSIS — I4891 Unspecified atrial fibrillation: Secondary | ICD-10-CM | POA: Diagnosis not present

## 2015-06-23 DIAGNOSIS — I472 Ventricular tachycardia: Secondary | ICD-10-CM | POA: Diagnosis present

## 2015-06-23 DIAGNOSIS — J438 Other emphysema: Secondary | ICD-10-CM | POA: Diagnosis not present

## 2015-06-23 DIAGNOSIS — I5032 Chronic diastolic (congestive) heart failure: Secondary | ICD-10-CM

## 2015-06-23 DIAGNOSIS — I2510 Atherosclerotic heart disease of native coronary artery without angina pectoris: Secondary | ICD-10-CM | POA: Diagnosis present

## 2015-06-23 DIAGNOSIS — I5042 Chronic combined systolic (congestive) and diastolic (congestive) heart failure: Secondary | ICD-10-CM | POA: Diagnosis present

## 2015-06-23 DIAGNOSIS — E119 Type 2 diabetes mellitus without complications: Secondary | ICD-10-CM | POA: Diagnosis not present

## 2015-06-23 DIAGNOSIS — E039 Hypothyroidism, unspecified: Secondary | ICD-10-CM | POA: Diagnosis present

## 2015-06-23 DIAGNOSIS — I482 Chronic atrial fibrillation: Secondary | ICD-10-CM | POA: Diagnosis present

## 2015-06-23 DIAGNOSIS — F17210 Nicotine dependence, cigarettes, uncomplicated: Secondary | ICD-10-CM | POA: Diagnosis present

## 2015-06-23 DIAGNOSIS — I6529 Occlusion and stenosis of unspecified carotid artery: Secondary | ICD-10-CM | POA: Diagnosis present

## 2015-06-23 DIAGNOSIS — Z8249 Family history of ischemic heart disease and other diseases of the circulatory system: Secondary | ICD-10-CM | POA: Diagnosis not present

## 2015-06-23 DIAGNOSIS — Z9119 Patient's noncompliance with other medical treatment and regimen: Secondary | ICD-10-CM | POA: Diagnosis present

## 2015-06-23 DIAGNOSIS — Z794 Long term (current) use of insulin: Secondary | ICD-10-CM | POA: Diagnosis not present

## 2015-06-23 DIAGNOSIS — I10 Essential (primary) hypertension: Secondary | ICD-10-CM | POA: Diagnosis present

## 2015-06-23 DIAGNOSIS — M1990 Unspecified osteoarthritis, unspecified site: Secondary | ICD-10-CM | POA: Diagnosis present

## 2015-06-23 DIAGNOSIS — N390 Urinary tract infection, site not specified: Secondary | ICD-10-CM | POA: Diagnosis present

## 2015-06-23 DIAGNOSIS — E1151 Type 2 diabetes mellitus with diabetic peripheral angiopathy without gangrene: Secondary | ICD-10-CM | POA: Diagnosis present

## 2015-06-23 DIAGNOSIS — Z888 Allergy status to other drugs, medicaments and biological substances status: Secondary | ICD-10-CM | POA: Diagnosis not present

## 2015-06-23 DIAGNOSIS — E669 Obesity, unspecified: Secondary | ICD-10-CM | POA: Diagnosis present

## 2015-06-23 DIAGNOSIS — K219 Gastro-esophageal reflux disease without esophagitis: Secondary | ICD-10-CM | POA: Diagnosis present

## 2015-06-23 DIAGNOSIS — I480 Paroxysmal atrial fibrillation: Secondary | ICD-10-CM | POA: Diagnosis not present

## 2015-06-23 DIAGNOSIS — Z833 Family history of diabetes mellitus: Secondary | ICD-10-CM | POA: Diagnosis not present

## 2015-06-23 DIAGNOSIS — I429 Cardiomyopathy, unspecified: Secondary | ICD-10-CM | POA: Diagnosis present

## 2015-06-23 DIAGNOSIS — E1165 Type 2 diabetes mellitus with hyperglycemia: Secondary | ICD-10-CM | POA: Diagnosis present

## 2015-06-23 DIAGNOSIS — J449 Chronic obstructive pulmonary disease, unspecified: Secondary | ICD-10-CM | POA: Diagnosis present

## 2015-06-23 DIAGNOSIS — Z7902 Long term (current) use of antithrombotics/antiplatelets: Secondary | ICD-10-CM | POA: Diagnosis not present

## 2015-06-23 DIAGNOSIS — I481 Persistent atrial fibrillation: Secondary | ICD-10-CM | POA: Diagnosis present

## 2015-06-23 DIAGNOSIS — Z79899 Other long term (current) drug therapy: Secondary | ICD-10-CM | POA: Diagnosis not present

## 2015-06-23 DIAGNOSIS — D649 Anemia, unspecified: Secondary | ICD-10-CM | POA: Diagnosis present

## 2015-06-23 DIAGNOSIS — E1142 Type 2 diabetes mellitus with diabetic polyneuropathy: Secondary | ICD-10-CM | POA: Diagnosis present

## 2015-06-23 DIAGNOSIS — Z7901 Long term (current) use of anticoagulants: Secondary | ICD-10-CM | POA: Diagnosis not present

## 2015-06-23 DIAGNOSIS — E785 Hyperlipidemia, unspecified: Secondary | ICD-10-CM | POA: Diagnosis present

## 2015-06-23 LAB — CBC WITH DIFFERENTIAL/PLATELET
Basophils Absolute: 0 10*3/uL (ref 0.0–0.1)
Basophils Relative: 0 % (ref 0–1)
Eosinophils Absolute: 0.3 10*3/uL (ref 0.0–0.7)
Eosinophils Relative: 2 % (ref 0–5)
HCT: 35.8 % — ABNORMAL LOW (ref 36.0–46.0)
Hemoglobin: 11.2 g/dL — ABNORMAL LOW (ref 12.0–15.0)
Lymphocytes Relative: 16 % (ref 12–46)
Lymphs Abs: 2.2 10*3/uL (ref 0.7–4.0)
MCH: 27.4 pg (ref 26.0–34.0)
MCHC: 31.3 g/dL (ref 30.0–36.0)
MCV: 87.5 fL (ref 78.0–100.0)
Monocytes Absolute: 1.1 10*3/uL — ABNORMAL HIGH (ref 0.1–1.0)
Monocytes Relative: 8 % (ref 3–12)
Neutro Abs: 10 10*3/uL — ABNORMAL HIGH (ref 1.7–7.7)
Neutrophils Relative %: 73 % (ref 43–77)
Platelets: 342 10*3/uL (ref 150–400)
RBC: 4.09 MIL/uL (ref 3.87–5.11)
RDW: 14.7 % (ref 11.5–15.5)
WBC: 13.7 10*3/uL — ABNORMAL HIGH (ref 4.0–10.5)

## 2015-06-23 LAB — TROPONIN I
Troponin I: 0.05 ng/mL — ABNORMAL HIGH (ref <0.031)
Troponin I: 0.06 ng/mL — ABNORMAL HIGH (ref <0.031)
Troponin I: 0.11 ng/mL — ABNORMAL HIGH (ref <0.031)

## 2015-06-23 LAB — CBC
HEMATOCRIT: 32.6 % — AB (ref 36.0–46.0)
Hemoglobin: 10.5 g/dL — ABNORMAL LOW (ref 12.0–15.0)
MCH: 28.2 pg (ref 26.0–34.0)
MCHC: 32.2 g/dL (ref 30.0–36.0)
MCV: 87.6 fL (ref 78.0–100.0)
Platelets: 326 10*3/uL (ref 150–400)
RBC: 3.72 MIL/uL — AB (ref 3.87–5.11)
RDW: 14.5 % (ref 11.5–15.5)
WBC: 11.1 10*3/uL — ABNORMAL HIGH (ref 4.0–10.5)

## 2015-06-23 LAB — URINE MICROSCOPIC-ADD ON

## 2015-06-23 LAB — BASIC METABOLIC PANEL
Anion gap: 8 (ref 5–15)
BUN: 20 mg/dL (ref 6–20)
CHLORIDE: 102 mmol/L (ref 101–111)
CO2: 28 mmol/L (ref 22–32)
Calcium: 8.8 mg/dL — ABNORMAL LOW (ref 8.9–10.3)
Creatinine, Ser: 1.02 mg/dL — ABNORMAL HIGH (ref 0.44–1.00)
GFR calc Af Amer: >60 mL/min (ref >60)
GFR calc non Af Amer: >60 mL/min (ref >60)
Glucose, Bld: 245 mg/dL — ABNORMAL HIGH (ref 65–99)
Potassium: 3.9 mmol/L (ref 3.5–5.1)
Sodium: 138 mmol/L (ref 135–145)

## 2015-06-23 LAB — COMPREHENSIVE METABOLIC PANEL
ALBUMIN: 3.1 g/dL — AB (ref 3.5–5.0)
ALT: 30 U/L (ref 14–54)
AST: 24 U/L (ref 15–41)
Alkaline Phosphatase: 194 U/L — ABNORMAL HIGH (ref 38–126)
Anion gap: 11 (ref 5–15)
BILIRUBIN TOTAL: 0.4 mg/dL (ref 0.3–1.2)
BUN: 25 mg/dL — AB (ref 6–20)
CALCIUM: 8.7 mg/dL — AB (ref 8.9–10.3)
CO2: 27 mmol/L (ref 22–32)
Chloride: 101 mmol/L (ref 101–111)
Creatinine, Ser: 1 mg/dL (ref 0.44–1.00)
GFR calc Af Amer: >60 mL/min (ref >60)
GFR calc non Af Amer: >60 mL/min (ref >60)
Glucose, Bld: 253 mg/dL — ABNORMAL HIGH (ref 65–99)
POTASSIUM: 3.7 mmol/L (ref 3.5–5.1)
Sodium: 139 mmol/L (ref 135–145)
Total Protein: 6.6 g/dL (ref 6.5–8.1)

## 2015-06-23 LAB — URINALYSIS, ROUTINE W REFLEX MICROSCOPIC
BILIRUBIN URINE: NEGATIVE
Glucose, UA: NEGATIVE mg/dL
KETONES UR: NEGATIVE mg/dL
Nitrite: NEGATIVE
Protein, ur: 100 mg/dL — AB
Specific Gravity, Urine: 1.014 (ref 1.005–1.030)
UROBILINOGEN UA: 0.2 mg/dL (ref 0.0–1.0)
pH: 5.5 (ref 5.0–8.0)

## 2015-06-23 LAB — GLUCOSE, CAPILLARY
GLUCOSE-CAPILLARY: 316 mg/dL — AB (ref 65–99)
Glucose-Capillary: 236 mg/dL — ABNORMAL HIGH (ref 65–99)

## 2015-06-23 LAB — MRSA PCR SCREENING: MRSA by PCR: NEGATIVE

## 2015-06-23 LAB — BRAIN NATRIURETIC PEPTIDE: B NATRIURETIC PEPTIDE 5: 539.4 pg/mL — AB (ref 0.0–100.0)

## 2015-06-23 LAB — PROTIME-INR
INR: 1.92 — ABNORMAL HIGH (ref 0.00–1.49)
PROTHROMBIN TIME: 21.9 s — AB (ref 11.6–15.2)

## 2015-06-23 LAB — CBG MONITORING, ED
Glucose-Capillary: 278 mg/dL — ABNORMAL HIGH (ref 65–99)
Glucose-Capillary: 321 mg/dL — ABNORMAL HIGH (ref 65–99)

## 2015-06-23 LAB — TSH: TSH: 1.647 u[IU]/mL (ref 0.350–4.500)

## 2015-06-23 LAB — I-STAT TROPONIN, ED: Troponin i, poc: 0.03 ng/mL (ref 0.00–0.08)

## 2015-06-23 MED ORDER — CARVEDILOL 12.5 MG PO TABS
12.5000 mg | ORAL_TABLET | Freq: Two times a day (BID) | ORAL | Status: DC
  Administered 2015-06-23 – 2015-06-24 (×3): 12.5 mg via ORAL
  Filled 2015-06-23 (×3): qty 1

## 2015-06-23 MED ORDER — LISINOPRIL 20 MG PO TABS
20.0000 mg | ORAL_TABLET | Freq: Every day | ORAL | Status: DC
  Administered 2015-06-24 – 2015-06-25 (×2): 20 mg via ORAL
  Filled 2015-06-23 (×2): qty 1

## 2015-06-23 MED ORDER — LEVALBUTEROL HCL 0.63 MG/3ML IN NEBU
0.6300 mg | INHALATION_SOLUTION | Freq: Four times a day (QID) | RESPIRATORY_TRACT | Status: DC
  Administered 2015-06-23: 0.63 mg via RESPIRATORY_TRACT
  Filled 2015-06-23 (×5): qty 3

## 2015-06-23 MED ORDER — SODIUM CHLORIDE 0.9 % IJ SOLN
3.0000 mL | Freq: Two times a day (BID) | INTRAMUSCULAR | Status: DC
  Administered 2015-06-23: 10 mL via INTRAVENOUS
  Administered 2015-06-23 – 2015-06-24 (×2): 3 mL via INTRAVENOUS
  Administered 2015-06-24: 10 mL via INTRAVENOUS
  Administered 2015-06-25: 3 mL via INTRAVENOUS

## 2015-06-23 MED ORDER — GLIPIZIDE ER 10 MG PO TB24
30.0000 mg | ORAL_TABLET | Freq: Every day | ORAL | Status: DC
  Administered 2015-06-23 – 2015-06-25 (×3): 30 mg via ORAL
  Filled 2015-06-23 (×6): qty 3

## 2015-06-23 MED ORDER — CARVEDILOL 6.25 MG PO TABS
6.2500 mg | ORAL_TABLET | Freq: Two times a day (BID) | ORAL | Status: DC
  Administered 2015-06-23: 6.25 mg via ORAL
  Filled 2015-06-23 (×3): qty 1

## 2015-06-23 MED ORDER — DIPHENHYDRAMINE HCL 50 MG/ML IJ SOLN
12.5000 mg | Freq: Once | INTRAMUSCULAR | Status: AC
  Administered 2015-06-23: 12.5 mg via INTRAVENOUS
  Filled 2015-06-23: qty 1

## 2015-06-23 MED ORDER — INSULIN ASPART 100 UNIT/ML ~~LOC~~ SOLN
0.0000 [IU] | Freq: Three times a day (TID) | SUBCUTANEOUS | Status: DC
  Administered 2015-06-23: 3 [IU] via SUBCUTANEOUS
  Administered 2015-06-23: 5 [IU] via SUBCUTANEOUS
  Filled 2015-06-23 (×2): qty 1

## 2015-06-23 MED ORDER — FUROSEMIDE 40 MG PO TABS
60.0000 mg | ORAL_TABLET | Freq: Two times a day (BID) | ORAL | Status: DC
  Administered 2015-06-23 – 2015-06-25 (×5): 60 mg via ORAL
  Filled 2015-06-23 (×10): qty 1
  Filled 2015-06-23: qty 3

## 2015-06-23 MED ORDER — INSULIN GLARGINE 100 UNIT/ML ~~LOC~~ SOLN
40.0000 [IU] | Freq: Every day | SUBCUTANEOUS | Status: DC
  Administered 2015-06-23: 40 [IU] via SUBCUTANEOUS
  Filled 2015-06-23 (×2): qty 0.4

## 2015-06-23 MED ORDER — METOCLOPRAMIDE HCL 5 MG/ML IJ SOLN
10.0000 mg | Freq: Once | INTRAMUSCULAR | Status: AC
  Administered 2015-06-23: 10 mg via INTRAVENOUS
  Filled 2015-06-23: qty 2

## 2015-06-23 MED ORDER — INSULIN ASPART 100 UNIT/ML ~~LOC~~ SOLN
4.0000 [IU] | Freq: Once | SUBCUTANEOUS | Status: AC
  Administered 2015-06-23: 4 [IU] via SUBCUTANEOUS

## 2015-06-23 MED ORDER — NITROGLYCERIN 0.4 MG SL SUBL: 0.4000 mg | SUBLINGUAL_TABLET | SUBLINGUAL | Status: DC | PRN

## 2015-06-23 MED ORDER — ACETAMINOPHEN 325 MG PO TABS
650.0000 mg | ORAL_TABLET | Freq: Four times a day (QID) | ORAL | Status: DC | PRN
  Administered 2015-06-23 – 2015-06-25 (×4): 650 mg via ORAL
  Filled 2015-06-23 (×4): qty 2

## 2015-06-23 MED ORDER — DEXTROSE 5 % IV SOLN
5.0000 mg/h | Freq: Once | INTRAVENOUS | Status: AC
  Administered 2015-06-23: 5 mg/h via INTRAVENOUS
  Filled 2015-06-23: qty 100

## 2015-06-23 MED ORDER — RIVAROXABAN 20 MG PO TABS
20.0000 mg | ORAL_TABLET | Freq: Every day | ORAL | Status: DC
  Administered 2015-06-23 – 2015-06-24 (×2): 20 mg via ORAL
  Filled 2015-06-23 (×2): qty 1

## 2015-06-23 MED ORDER — CLOPIDOGREL BISULFATE 75 MG PO TABS
75.0000 mg | ORAL_TABLET | Freq: Every day | ORAL | Status: DC
  Administered 2015-06-23 – 2015-06-25 (×3): 75 mg via ORAL
  Filled 2015-06-23 (×3): qty 1

## 2015-06-23 MED ORDER — ONDANSETRON HCL 4 MG PO TABS: 4.0000 mg | ORAL_TABLET | Freq: Four times a day (QID) | ORAL | Status: DC | PRN

## 2015-06-23 MED ORDER — GABAPENTIN 400 MG PO CAPS
400.0000 mg | ORAL_CAPSULE | Freq: Four times a day (QID) | ORAL | Status: DC
  Administered 2015-06-23 – 2015-06-25 (×8): 400 mg via ORAL
  Filled 2015-06-23 (×8): qty 1

## 2015-06-23 MED ORDER — LEVOTHYROXINE SODIUM 50 MCG PO TABS
50.0000 ug | ORAL_TABLET | Freq: Every day | ORAL | Status: DC
  Administered 2015-06-23 – 2015-06-25 (×3): 50 ug via ORAL
  Filled 2015-06-23 (×4): qty 1

## 2015-06-23 MED ORDER — SODIUM CHLORIDE 0.9 % IV BOLUS (SEPSIS): 1000.0000 mL | Freq: Once | INTRAVENOUS | Status: DC

## 2015-06-23 MED ORDER — DILTIAZEM HCL 100 MG IV SOLR
INTRAVENOUS | Status: AC
  Filled 2015-06-23: qty 100

## 2015-06-23 MED ORDER — ACETAMINOPHEN 650 MG RE SUPP: 650.0000 mg | Freq: Four times a day (QID) | RECTAL | Status: DC | PRN

## 2015-06-23 MED ORDER — LEVALBUTEROL HCL 0.63 MG/3ML IN NEBU
0.6300 mg | INHALATION_SOLUTION | Freq: Four times a day (QID) | RESPIRATORY_TRACT | Status: DC | PRN
  Filled 2015-06-23: qty 3

## 2015-06-23 MED ORDER — DEXTROSE 5 % IV SOLN
1.0000 g | INTRAVENOUS | Status: DC
  Administered 2015-06-23 – 2015-06-25 (×3): 1 g via INTRAVENOUS
  Filled 2015-06-23 (×3): qty 10

## 2015-06-23 MED ORDER — PRAVASTATIN SODIUM 40 MG PO TABS
40.0000 mg | ORAL_TABLET | Freq: Every day | ORAL | Status: DC
  Administered 2015-06-23 – 2015-06-24 (×2): 40 mg via ORAL
  Filled 2015-06-23 (×2): qty 1

## 2015-06-23 MED ORDER — ASPIRIN EC 81 MG PO TBEC
81.0000 mg | DELAYED_RELEASE_TABLET | Freq: Every day | ORAL | Status: DC
  Administered 2015-06-23 – 2015-06-24 (×2): 81 mg via ORAL
  Filled 2015-06-23 (×2): qty 1

## 2015-06-23 MED ORDER — ONDANSETRON HCL 4 MG/2ML IJ SOLN: 4.0000 mg | Freq: Four times a day (QID) | INTRAMUSCULAR | Status: DC | PRN

## 2015-06-23 MED ORDER — DILTIAZEM HCL 25 MG/5ML IV SOLN
20.0000 mg | Freq: Once | INTRAVENOUS | Status: AC
  Administered 2015-06-23: 20 mg via INTRAVENOUS
  Filled 2015-06-23: qty 5

## 2015-06-23 MED ORDER — PANTOPRAZOLE SODIUM 40 MG PO TBEC
40.0000 mg | DELAYED_RELEASE_TABLET | Freq: Every day | ORAL | Status: DC
  Administered 2015-06-23 – 2015-06-25 (×3): 40 mg via ORAL
  Filled 2015-06-23 (×3): qty 1

## 2015-06-23 MED ORDER — DILTIAZEM HCL 100 MG IV SOLR
5.0000 mg/h | Freq: Once | INTRAVENOUS | Status: AC
  Administered 2015-06-23: 10 mg/h via INTRAVENOUS

## 2015-06-23 MED ORDER — SODIUM CHLORIDE 0.9 % IV BOLUS (SEPSIS)
1000.0000 mL | Freq: Once | INTRAVENOUS | Status: AC
  Administered 2015-06-23: 1000 mL via INTRAVENOUS

## 2015-06-23 NOTE — ED Notes (Signed)
Cards MD called; updated pt converted in sinus. Verbal orders for dilt to be discontinued. Pt to go to tele floor. Pt is in no distress; reports feels better.

## 2015-06-23 NOTE — Progress Notes (Addendum)
Patient Demographics:    [name], is a 54 y.o. female, DOB - 1961-09-29, [id number]  Admit date - 06/22/2015   Admitting Physician No admitting provider for patient encounter.  Outpatient Primary MD for the patient is DEAN, [name], MD  LOS - 0   Chief Complaint  Patient presents with  . Chest Pain  . Shortness of Breath  . Irregular Heart Beat        Subjective:    [name] today has, No headache, No chest pain, No abdominal pain - No Nausea, No new weakness tingling or numbness, No Cough - SOB.     Assessment  & Plan :     1.Chr.Afib with RVR [name] 2 - 4 - guarding etiology on board, currently on combination of Coreg along with IV diltiazem, on xaralto which will be continued. We'll defer monitoring of this problem to an allergy.   2. Mildly elevated troponin and non-ACS pattern. Secondary to demand ischemia from RVR, chest pain-free, EKG nonacute, cardiology on board. Continue ASA, Coreg, statin along with xaralto as above.     3. Dyslipidemia. Continue home statin.   4. PAD along with carotid artery stenosis. Continue antiplatelets and statin for secondary prevention.   5. Chronic systolic heart failure last EF 45% on December 2015 echocardiogram. Trace edema on exam, she is on a combination of Coreg, Lasix, ACE inhibitor which will be continued. Place on salt and fluid restriction. Monitor clinically.   6. GERD. Continue home dose PPI.    7. UTI. On Rocephin monitor.    8. DM type II. On Lantus and glimepiride along with sliding scale.  Lab Results  Component Value Date   HGBA1C 8.7* 06/16/2015    CBG (last 3)   Recent Labs  06/23/15 0842  GLUCAP 278*      Code Status : Full  Family Communication  : None present  Disposition Plan  : Home in 1-2  days  Consults  : Cardiology  Procedures  :   DVT Prophylaxis  :  Xaralto  Lab Results  Component Value Date   PLT 342 06/23/2015    Inpatient Medications  Scheduled Meds: . aspirin EC  81 mg Oral Daily  . carvedilol  6.25 mg Oral BID WC  . clopidogrel  75 mg Oral Q breakfast  . furosemide  60 mg Oral BID  . gabapentin  400 mg Oral QID  . glipiZIDE  30 mg Oral Q breakfast  . insulin aspart  0-9 Units Subcutaneous TID WC  . insulin glargine  40 Units Subcutaneous QHS  . levalbuterol  0.63 mg Nebulization Q6H  . levothyroxine  50 mcg Oral QAC breakfast  . lisinopril  20 mg Oral Daily  . pantoprazole  40 mg Oral Daily  . pravastatin  40 mg Oral q1800  . rivaroxaban  20 mg Oral Q supper  . sodium chloride  3 mL Intravenous Q12H   Continuous Infusions: . diltiazem (CARDIZEM) infusion     PRN Meds:.acetaminophen **OR** acetaminophen, levalbuterol, nitroGLYCERIN, ondansetron **OR** ondansetron (ZOFRAN) IV  Antibiotics  :    Anti-infectives    None        Objective:   Filed Vitals:   06/23/15 1000 06/23/15 1021 06/23/15 1030 06/23/15  1045  BP: 96/71 99/70 106/59 102/70  Pulse: 139  95 103  Temp:      TempSrc:      Resp: 14  12 20   Weight:      SpO2: 96%  92% 97%    Wt Readings from Last 3 Encounters:  06/23/15 99.791 kg (220 lb)  06/19/15 102.422 kg (225 lb 12.8 oz)  05/03/15 100 kg (220 lb 7.4 oz)     Intake/Output Summary (Last 24 hours) at 06/23/15 1129 Last data filed at 06/23/15 1057  Gross per 24 hour  Intake      0 ml  Output    400 ml  Net   -400 ml     Physical Exam  Awake Alert, Oriented X 3, No new F.N deficits, Normal affect [place].AT,PERRAL Supple Neck,No JVD, No cervical lymphadenopathy appriciated.  Symmetrical Chest wall movement, Good air movement bilaterally, CTAB iRRR,No Gallops,Rubs or new Murmurs, No Parasternal Heave +ve B.Sounds, Abd Soft, No tenderness, No organomegaly appriciated, No rebound - guarding or rigidity. No  Cyanosis, Clubbing or edema, No new Rash or bruise       Data Review:   Micro Results No results found for this or any previous visit (from the past 240 hour(s)).  Radiology Reports Dg Chest 2 View  06/23/2015   CLINICAL DATA:  Shortness of breath and chest pain  EXAM: CHEST  2 VIEW  COMPARISON:  June 14, 2015  FINDINGS: There is no edema or consolidation. Heart is upper normal in size with pulmonary vascularity within normal limits. No adenopathy. No bone lesions.  IMPRESSION: No edema or consolidation.   Electronically Signed   By: Lowella Grip III M.D.   On: 06/23/2015 01:24   Dg Chest 2 View  06/14/2015   CLINICAL DATA:  Dyspnea. Cough, shortness of breath, nausea and headache.  EXAM: CHEST  2 VIEW  COMPARISON:  04/29/2015  FINDINGS: Lung volumes are low. Heart remains at the upper limits of normal in size. Prominent interstitial opacities suspicious for pulmonary edema, similar to prior exam. Small right pleural effusion and fluid in the right minor fissure. Mild atelectasis at the right lung base. No consolidation to suggest pneumonia. No pneumothorax. No acute osseous abnormality is seen.  IMPRESSION: 1. Stable borderline cardiomegaly and interstitial prominence suspicious for pulmonary edema. 2. Small right pleural effusion, new.   Electronically Signed   By: Jeb Levering M.D.   On: 06/14/2015 05:43     CBC  Recent Labs Lab 06/17/15 0449 06/23/15 0005 06/23/15 0527  WBC 10.9* 11.1* 13.7*  HGB 10.4* 10.5* 11.2*  HCT 32.3* 32.6* 35.8*  PLT 328 326 342  MCV 86.1 87.6 87.5  MCH 27.7 28.2 27.4  MCHC 32.2 32.2 31.3  RDW 15.0 14.5 14.7  LYMPHSABS  --   --  2.2  MONOABS  --   --  1.1*  EOSABS  --   --  0.3  BASOSABS  --   --  0.0    Chemistries   Recent Labs Lab 06/17/15 0449 06/18/15 0359 06/19/15 0414 06/23/15 0005 06/23/15 0527  NA 138 137 133* 138 139  K 3.8 4.0 4.4 3.9 3.7  CL 100* 98* 95* 102 101  CO2 29 33* 29 28 27   GLUCOSE 140* 171* 241* 245* 253*   BUN 37* 27* 32* 20 25*  CREATININE 0.97 0.95 1.10* 1.02* 1.00  CALCIUM 8.5* 8.5* 8.0* 8.8* 8.7*  AST  --   --   --   --  24  ALT  --   --   --   --  30  ALKPHOS  --   --   --   --  194*  BILITOT  --   --   --   --  0.4   ------------------------------------------------------------------------------------------------------------------ estimated creatinine clearance is 75.5 mL/min (by C-G formula based on Cr of 1). ------------------------------------------------------------------------------------------------------------------ No results for input(s): HGBA1C in the last 72 hours. ------------------------------------------------------------------------------------------------------------------ No results for input(s): CHOL, HDL, LDLCALC, TRIG, CHOLHDL, LDLDIRECT in the last 72 hours. ------------------------------------------------------------------------------------------------------------------  Recent Labs  06/23/15 0044  TSH 1.647   ------------------------------------------------------------------------------------------------------------------ No results for input(s): VITAMINB12, FOLATE, FERRITIN, TIBC, IRON, RETICCTPCT in the last 72 hours.  Coagulation profile  Recent Labs Lab 06/23/15 0005  INR 1.92*    No results for input(s): DDIMER in the last 72 hours.  Cardiac Enzymes  Recent Labs Lab 06/23/15 0527  TROPONINI 0.05*   ------------------------------------------------------------------------------------------------------------------ Invalid input(s): POCBNP   Time Spent in minutes  35   SINGH,PRASHANT K M.D on 06/23/2015 at 11:29 AM  Between 7am to 7pm - Pager - [phone]  After 7pm go to [email or web address] - password [facility]  [facility] -  Office  [phone]

## 2015-06-23 NOTE — Telephone Encounter (Signed)
This patient had been referred to the [facility] after her prior hospitalization and has an appointment scheduled for 06/25/15@ 1200. This CM went to see the patient in the ED and she was in the process of being admitted to the hospital.

## 2015-06-23 NOTE — H&P (Signed)
[facility] History and Physical  [name] [id number] DOB: [birth year] DOA: 06/22/2015  Referring physician: Dr. Mingo Amber. PCP: Kevan Ny, MD  Specialists: None.  Chief Complaint: Chest pain and palpitations.  HPI: [name] is a 54 y.o. female with history of chronic atrial fibrillation, COPD, chronic diastolic heart failure, diabetes mellitus type 2 presents to the ER because of sudden onset of chest pain headache and palpitations. In the ER patient was found to have A. fib with RVR. Patient was started on Cardizem infusion and admitted for further control of heart rate. Presently patient is chest pain-free. Patient has mild frontal headache for which patient is receiving Benadryl and Reglan. Patient denies any nausea vomiting or productive cough fever chills abdominal pain diarrhea or any focal deficits. Denies any visual symptoms. Patient was recently admitted for CHF.   Review of Systems: As presented in the history of presenting illness, rest negative.  Past Medical History  Diagnosis Date  . Alcohol abuse   . Narcotic abuse   . Cocaine abuse   . Marijuana abuse   . Tobacco abuse   . Alcoholic cirrhosis   . Cardiomyopathy   . Obesity   . CHF (congestive heart failure)   . Hypothyroidism   . GERD (gastroesophageal reflux disease)   . Atrial fibrillation   . Critical lower limb ischemia   . Hypertension   . CAD (coronary artery disease)     a. cath 11/10/2014 small vessel CAD. Demand ischemia in the setting of rapid a-fib  . Pneumonia     END MARCH 2016  . Anxiety   . Wrist fracture     LEFT; "just did xray and cast"  . DOE (dyspnea on exertion) 04/29/2015  . Poorly controlled type 2 diabetes mellitus   . Hyperlipemia   . Peripheral arterial disease     a. 01/2015 Angio/PTA: LSFA 100 w/ recon @ adductor canal and 1 vessel runoff via AT, RSFA 99 (atherectomy/pta) - 1 vessel runoff via diff dzs peroneal.  . Carotid artery disease     a. 01/2015 Carotid  Angio: RICA [id number], LICA [id number].  [name] COPD (chronic obstructive pulmonary disease)   . Arthritis     "hands, arms, shoulders, legs, back" (04/29/2015)  . Diabetic peripheral neuropathy    Past Surgical History  Procedure Laterality Date  . Cardioversion  ~ 02/2013    "twice"   . Lower extremity angiogram N/A 09/10/2013    Procedure: LOWER EXTREMITY ANGIOGRAM;  Surgeon: [name], MD;  Location: [facility] CATH LAB;  Service: Cardiovascular;  Laterality: N/A;  . Left heart catheterization with coronary angiogram N/A 10/31/2014    Procedure: LEFT HEART CATHETERIZATION WITH CORONARY ANGIOGRAM;  Surgeon: Burnell Blanks, MD;  Location: [facility] CATH LAB;  Service: Cardiovascular;  Laterality: N/A;  . Peripheral athrectomy Right 01/15/2015    SFA/notes 01/15/2015  . Balloon angioplasty, artery Right 01/15/2015    SFA/notes 01/15/2015  . Cardiac catheterization    . Lower extremity angiogram N/A 01/15/2015    Procedure: LOWER EXTREMITY ANGIOGRAM;  Surgeon: [name], MD;  Location: [facility] CATH LAB;  Service: Cardiovascular;  Laterality: N/A;  . Carotid angiogram N/A 01/15/2015    Procedure: CAROTID ANGIOGRAM;  Surgeon: [name], MD;  Location: [facility] CATH LAB;  Service: Cardiovascular;  Laterality: N/A;  . Endarterectomy Left 02/19/2015    Procedure: LEFT CAROTID ENDARTERECTOMY ;  Surgeon: Serafina Mitchell, MD;  Location: [place];  Service: Vascular;  Laterality: Left;  . Dilation and  curettage of uterus  1988   Social History:  reports that she has been smoking Cigarettes.  She has a 40 pack-year smoking history. She has never used smokeless tobacco. She reports that she drinks alcohol. She reports that she uses illicit drugs ("Crack" cocaine and Marijuana). Where does patient live at home. Can patient participate in ADLs? Yes.  Allergies  Allergen Reactions  . Amiodarone Nausea And Vomiting    Family History:  Family History  Problem Relation Age of Onset  . Hypertension Mother   . Diabetes Mother    . Cancer Mother     breast, ovarian, colon  . Clotting disorder Mother   . Heart disease Mother   . Heart attack Mother   . Hypertension Father   . Heart disease Father   . Emphysema Sister     smoked      Prior to Admission medications   Medication Sig Start Date End Date Taking? Authorizing Provider  albuterol (PROVENTIL HFA;VENTOLIN HFA) 108 (90 BASE) MCG/ACT inhaler Inhale 2 puffs into the lungs every 6 (six) hours as needed for wheezing or shortness of breath.   Yes Historical Provider, MD  albuterol (PROVENTIL) (2.5 MG/3ML) 0.083% nebulizer solution Take 2.5 mg by nebulization every 6 (six) hours as needed. For wheezing.   Yes Historical Provider, MD  carvedilol (COREG) 6.25 MG tablet Take 1 tablet (6.25 mg total) by mouth 2 (two) times daily with a meal. 02/05/15  Yes [name], MD  clopidogrel (PLAVIX) 75 MG tablet Take 1 tablet (75 mg total) by mouth daily with breakfast. 01/16/15  Yes Rogelia Mire, NP  furosemide (LASIX) 40 MG tablet Take 1.5 tablets (60 mg total) by mouth 2 (two) times daily. 06/19/15  Yes Donne Hazel, MD  gabapentin (NEURONTIN) 400 MG capsule Take 400 mg by mouth 4 (four) times daily.   Yes Historical Provider, MD  glipiZIDE (GLUCOTROL XL) 10 MG 24 hr tablet Take 30 mg by mouth daily with breakfast.   Yes Historical Provider, MD  insulin aspart (NOVOLOG) 100 UNIT/ML injection Inject 7-10 Units into the skin 3 (three) times daily with meals. Sliding scale, usually takes 10 units in am, 7 units in pm   Yes Historical Provider, MD  insulin glargine (LANTUS) 100 UNIT/ML injection Inject 0.4 mLs (40 Units total) into the skin at bedtime. 05/03/15  Yes Nishant Dhungel, MD  levothyroxine (SYNTHROID, LEVOTHROID) 50 MCG tablet Take 50 mcg by mouth daily.   Yes Historical Provider, MD  lisinopril (PRINIVIL,ZESTRIL) 20 MG tablet Take 20 mg by mouth daily.   Yes Historical Provider, MD  metFORMIN (GLUCOPHAGE) 1000 MG tablet Take 1,000 mg by mouth 2 (two) times  daily with a meal.   Yes Historical Provider, MD  nitroGLYCERIN (NITROSTAT) 0.4 MG SL tablet Place 1 tablet (0.4 mg total) under the tongue every 5 (five) minutes as needed for chest pain. 01/16/15  Yes Rogelia Mire, NP  pantoprazole (PROTONIX) 40 MG tablet Take 40 mg by mouth daily. 02/13/15 02/13/16 Yes Historical Provider, MD  pravastatin (PRAVACHOL) 80 MG tablet Take 1 tablet (80 mg total) by mouth daily at 6 PM. Patient taking differently: Take 40 mg by mouth daily at 6 PM.  04/01/15  Yes [name], MD  rivaroxaban (XARELTO) 20 MG TABS tablet Take 20 mg by mouth daily with supper.   Yes Historical Provider, MD    Physical Exam: Filed Vitals:   06/23/15 0315 06/23/15 0330 06/23/15 0345 06/23/15 0400  BP: 102/74 99/59  105/58 127/83  Pulse: 122 36 66 72  Temp:      TempSrc:      Resp: 17 24 13 19   Weight:      SpO2: 93% 94% 95% 98%     General:  Moderately built and nourished.  Eyes: Anicteric no pallor.  ENT: No discharge from the ears eyes nose or mouth.  Neck: No mass felt.  Cardiovascular: S1 and S2 were heard.  Respiratory: No rhonchi or crepitations.  Abdomen: Soft nontender bowel sounds present.  Skin: No rash.  Musculoskeletal: No edema.  Psychiatric: Appears normal.  Neurologic: Alert awake oriented to time place and person. Moves all extremities.  Labs on Admission:  Basic Metabolic Panel:  Recent Labs Lab 06/17/15 0449 06/18/15 0359 06/19/15 0414 06/23/15 0005  NA 138 137 133* 138  K 3.8 4.0 4.4 3.9  CL 100* 98* 95* 102  CO2 29 33* 29 28  GLUCOSE 140* 171* 241* 245*  BUN 37* 27* 32* 20  CREATININE 0.97 0.95 1.10* 1.02*  CALCIUM 8.5* 8.5* 8.0* 8.8*   Liver Function Tests: No results for input(s): AST, ALT, ALKPHOS, BILITOT, PROT, ALBUMIN in the last 168 hours. No results for input(s): LIPASE, AMYLASE in the last 168 hours. No results for input(s): AMMONIA in the last 168 hours. CBC:  Recent Labs Lab 06/17/15 0449 06/23/15 0005   WBC 10.9* 11.1*  HGB 10.4* 10.5*  HCT 32.3* 32.6*  MCV 86.1 87.6  PLT 328 326   Cardiac Enzymes: No results for input(s): CKTOTAL, CKMB, CKMBINDEX, TROPONINI in the last 168 hours.  BNP (last 3 results)  Recent Labs  04/29/15 1553 06/14/15 0500 06/23/15 0005  BNP 1483.5* 1044.2* 539.4*    ProBNP (last 3 results)  Recent Labs  09/07/14 0406 10/29/14 0222  PROBNP 1423.0* 1070.0*    CBG:  Recent Labs Lab 06/18/15 1117 06/18/15 1625 06/18/15 2040 06/19/15 0617 06/19/15 1135  GLUCAP 268* 302* 184* 185* 304*    Radiological Exams on Admission: Dg Chest 2 View  06/23/2015   CLINICAL DATA:  Shortness of breath and chest pain  EXAM: CHEST  2 VIEW  COMPARISON:  June 14, 2015  FINDINGS: There is no edema or consolidation. Heart is upper normal in size with pulmonary vascularity within normal limits. No adenopathy. No bone lesions.  IMPRESSION: No edema or consolidation.   Electronically Signed   By: Lowella Grip III M.D.   On: 06/23/2015 01:24    EKG: Independently reviewed. A. fib with RVR.  Assessment/Plan Principal Problem:   Atrial fibrillation with RVR Active Problems:   Hypothyroidism   Chronic diastolic CHF (congestive heart failure)   COPD (chronic obstructive pulmonary disease)   Diabetes mellitus type 2, controlled   1. A. fib with RVR - not sure what precipitated. Check TSH and d-dimer. Continue Cardizem infusion and restart home medication including Coreg. Not sure if patient was compliant with her medications. Once patient takes her Coreg then try to wean her off Cardizem infusion. Patient's chads 2 vasc score is more than 2. Patient is on xarelto. Patient was not taking xarelto for last 34 days because of nausea and vomiting. Patient's vomiting has resolved. 2. Chest pain - probably precipitated by elevated heart rate. At this time chest pain-free. Cycle cardiac markers and check d-dimer. 3. Chronic diastolic CHF last EF measured was 40-45% in  December 2015  - appears compensated. Continue Lasix. Closely follow intake and output and daily weights. 4. COPD - presently not wheezing. I have replaced  albuterol with Xopenex secondary to A. fib with RVR. 5. Diabetes mellitus type 2 with hyperglycemia - closely monitor CBGs with sliding-scale coverage. Continue home medications. 6. Chronic anemia - follow CBC. 7. Hypothyroidism - continue Synthroid. Check TSH.  I have reviewed patient's old charts and labs and personally reviewed patient's chest x-ray.   DVT Prophylaxis xarelto. Code Status: Full code.  Family Communication: Discussed with patient.  Disposition Plan: Admit to inpatient.    Annmarie Plemmons N. [facility] Pager [phone].  If 7PM-7AM, please contact night-coverage [email or web address] Password [email or web address] 06/23/2015, 4:26 AM

## 2015-06-23 NOTE — ED Notes (Addendum)
Henri Medal, MD at bedside.

## 2015-06-23 NOTE — ED Notes (Addendum)
PT in no signs of distress; no needs.

## 2015-06-23 NOTE — ED Notes (Signed)
Admitting phy stated to call cardiology about pt HR

## 2015-06-23 NOTE — ED Notes (Signed)
Paged cardiology 

## 2015-06-23 NOTE — ED Notes (Signed)
Dr Lara Mulch notified about elevated troponin. Verbalized he will notify cardiology.

## 2015-06-23 NOTE — Progress Notes (Signed)
   Converted to NSR. Stopped diltiazem IV Increased coreg to 12.5 BID (first increased dose this evening).   Candee Furbish, MD

## 2015-06-23 NOTE — ED Notes (Signed)
Patient is resting comfortably. 

## 2015-06-23 NOTE — Consult Note (Signed)
Admit date: 06/22/2015 Referring Physician  Dr. Candiss Norse Primary Physician [name], [name], MD Primary Cardiologist  Dr. Johnsie Cancel Reason for Consultation  AFIB with RVR and elevated   HPI: 54 year old with history of COPD, PVD, diabetes, persistent atrial fibrillation who presented to the emergency room with worsening palpitations and chest discomfort. She was also complaining of headache as well. Diltiazem was utilized and successfully helped with rate control. She was initially given adenosine because of the rapid ventricular rate, unsuccessful since this was atrial fibrillation.  No recent fevers, diarrhea, nausea, strokelike symptoms.  Shortly after presentation to the emergency room, her chest pain subsided. Troponin was drawn and was mildly elevated 0.05. Creatinine is 1.0. White count is also minimally elevated at 13.7. She reports that she has been taking her Xarelto. She is also been taking carvedilol 6.25 mg today at home.   PMH:   Past Medical History  Diagnosis Date  . Alcohol abuse   . Narcotic abuse   . Cocaine abuse   . Marijuana abuse   . Tobacco abuse   . Alcoholic cirrhosis   . Cardiomyopathy   . Obesity   . CHF (congestive heart failure)   . Hypothyroidism   . GERD (gastroesophageal reflux disease)   . Atrial fibrillation   . Critical lower limb ischemia   . Hypertension   . CAD (coronary artery disease)     a. cath 11/10/2014 small vessel CAD. Demand ischemia in the setting of rapid a-fib  . Pneumonia     END MARCH 2016  . Anxiety   . Wrist fracture     LEFT; "just did xray and cast"  . DOE (dyspnea on exertion) 04/29/2015  . Poorly controlled type 2 diabetes mellitus   . Hyperlipemia   . Peripheral arterial disease     a. 01/2015 Angio/PTA: LSFA 100 w/ recon @ adductor canal and 1 vessel runoff via AT, RSFA 99 (atherectomy/pta) - 1 vessel runoff via diff dzs peroneal.  . Carotid artery disease     a. 01/2015 Carotid Angio: RICA [id number], LICA [id number].  [name] COPD (chronic  obstructive pulmonary disease)   . Arthritis     "hands, arms, shoulders, legs, back" (04/29/2015)  . Diabetic peripheral neuropathy     PSH:   Past Surgical History  Procedure Laterality Date  . Cardioversion  ~ 02/2013    "twice"   . Lower extremity angiogram N/A 09/10/2013    Procedure: LOWER EXTREMITY ANGIOGRAM;  Surgeon: [name], MD;  Location: [facility] CATH LAB;  Service: Cardiovascular;  Laterality: N/A;  . Left heart catheterization with coronary angiogram N/A 10/31/2014    Procedure: LEFT HEART CATHETERIZATION WITH CORONARY ANGIOGRAM;  Surgeon: Burnell Blanks, MD;  Location: [facility] CATH LAB;  Service: Cardiovascular;  Laterality: N/A;  . Peripheral athrectomy Right 01/15/2015    SFA/notes 01/15/2015  . Balloon angioplasty, artery Right 01/15/2015    SFA/notes 01/15/2015  . Cardiac catheterization    . Lower extremity angiogram N/A 01/15/2015    Procedure: LOWER EXTREMITY ANGIOGRAM;  Surgeon: [name], MD;  Location: [facility] CATH LAB;  Service: Cardiovascular;  Laterality: N/A;  . Carotid angiogram N/A 01/15/2015    Procedure: CAROTID ANGIOGRAM;  Surgeon: [name], MD;  Location: [facility] CATH LAB;  Service: Cardiovascular;  Laterality: N/A;  . Endarterectomy Left 02/19/2015    Procedure: LEFT CAROTID ENDARTERECTOMY ;  Surgeon: Serafina Mitchell, MD;  Location: [facility] OR;  Service: Vascular;  Laterality: Left;  . Dilation and curettage of uterus  1988   Allergies:  Amiodarone Prior to Admit Meds:   Prior to Admission medications   Medication Sig Start Date End Date Taking? Authorizing Provider  albuterol (PROVENTIL HFA;VENTOLIN HFA) 108 (90 BASE) MCG/ACT inhaler Inhale 2 puffs into the lungs every 6 (six) hours as needed for wheezing or shortness of breath.   Yes Historical Provider, MD  albuterol (PROVENTIL) (2.5 MG/3ML) 0.083% nebulizer solution Take 2.5 mg by nebulization every 6 (six) hours as needed. For wheezing.   Yes Historical Provider, MD  carvedilol (COREG) 6.25 MG tablet  Take 1 tablet (6.25 mg total) by mouth 2 (two) times daily with a meal. 02/05/15  Yes [name], MD  clopidogrel (PLAVIX) 75 MG tablet Take 1 tablet (75 mg total) by mouth daily with breakfast. 01/16/15  Yes Rogelia Mire, NP  furosemide (LASIX) 40 MG tablet Take 1.5 tablets (60 mg total) by mouth 2 (two) times daily. 06/19/15  Yes Donne Hazel, MD  gabapentin (NEURONTIN) 400 MG capsule Take 400 mg by mouth 4 (four) times daily.   Yes Historical Provider, MD  glipiZIDE (GLUCOTROL XL) 10 MG 24 hr tablet Take 30 mg by mouth daily with breakfast.   Yes Historical Provider, MD  insulin aspart (NOVOLOG) 100 UNIT/ML injection Inject 7-10 Units into the skin 3 (three) times daily with meals. Sliding scale, usually takes 10 units in am, 7 units in pm   Yes Historical Provider, MD  insulin glargine (LANTUS) 100 UNIT/ML injection Inject 0.4 mLs (40 Units total) into the skin at bedtime. 05/03/15  Yes Nishant Dhungel, MD  levothyroxine (SYNTHROID, LEVOTHROID) 50 MCG tablet Take 50 mcg by mouth daily.   Yes Historical Provider, MD  lisinopril (PRINIVIL,ZESTRIL) 20 MG tablet Take 20 mg by mouth daily.   Yes Historical Provider, MD  metFORMIN (GLUCOPHAGE) 1000 MG tablet Take 1,000 mg by mouth 2 (two) times daily with a meal.   Yes Historical Provider, MD  nitroGLYCERIN (NITROSTAT) 0.4 MG SL tablet Place 1 tablet (0.4 mg total) under the tongue every 5 (five) minutes as needed for chest pain. 01/16/15  Yes Rogelia Mire, NP  pantoprazole (PROTONIX) 40 MG tablet Take 40 mg by mouth daily. 02/13/15 02/13/16 Yes Historical Provider, MD  pravastatin (PRAVACHOL) 80 MG tablet Take 1 tablet (80 mg total) by mouth daily at 6 PM. Patient taking differently: Take 40 mg by mouth daily at 6 PM.  04/01/15  Yes [name], MD  rivaroxaban (XARELTO) 20 MG TABS tablet Take 20 mg by mouth daily with supper.   Yes Historical Provider, MD   Fam HX:    Family History  Problem Relation Age of Onset  . Hypertension  Mother   . Diabetes Mother   . Cancer Mother     breast, ovarian, colon  . Clotting disorder Mother   . Heart disease Mother   . Heart attack Mother   . Hypertension Father   . Heart disease Father   . Emphysema Sister     smoked   Social HX:    History   Social History  . Marital Status: Divorced    Spouse Name: N/A  . Number of Children: N/A  . Years of Education: N/A   Occupational History  . disabled    Social History Main Topics  . Smoking status: Current Every Day Smoker -- 1.00 packs/day for 40 years    Types: Cigarettes    Last Attempt to Quit: 02/19/2015  . Smokeless tobacco: Never Used  .  Alcohol Use: 0.0 oz/week    0 Standard drinks or equivalent per week     Comment: 04/29/2015 "might have a mixed drink maybe once/month, or less"  . Drug Use: Yes    Special: "Crack" cocaine, Marijuana     Comment: 04/29/2015 "last drug use was ~ 09/08/2013"  . Sexual Activity: No   Other Topics Concern  . Not on file   Social History Narrative     ROS:  All 11 ROS were addressed and are negative except what is stated in the HPI   Physical Exam: Blood pressure 103/77, pulse 110, temperature 98 F (36.7 C), temperature source Oral, resp. rate 21, weight 220 lb (99.791 kg), last menstrual period 11/27/2012, SpO2 93 %.   General: Well developed, well nourished, in no acute distress Head: Eyes PERRLA, No xanthomas.   Normal cephalic and atramatic  Lungs:   Clear bilaterally to auscultation and percussion. Normal respiratory effort. No wheezes, no rales. Heart:   Irregularly irregular, mildly tachycardic Pulses are 2+ & equal. No murmur, rubs, gallops.  No carotid bruit. No JVD.  No abdominal bruits.  Abdomen: Bowel sounds are positive, abdomen soft and non-tender without masses. No hepatosplenomegaly. Msk:  Back normal. Normal strength and tone for age. Extremities:  No clubbing, cyanosis or edema.  DP +1 Neuro: Alert and oriented X 3, non-focal, MAE x 4 GU:  Deferred Rectal: Deferred Psych:  Good affect, responds appropriately      Labs: Lab Results  Component Value Date   WBC 13.7* 06/23/2015   HGB 11.2* 06/23/2015   HCT 35.8* 06/23/2015   MCV 87.5 06/23/2015   PLT 342 06/23/2015     Recent Labs Lab 06/23/15 0527  NA 139  K 3.7  CL 101  CO2 27  BUN 25*  CREATININE 1.00  CALCIUM 8.7*  PROT 6.6  BILITOT 0.4  ALKPHOS 194*  ALT 30  AST 24  GLUCOSE 253*    Recent Labs  06/23/15 0527  TROPONINI 0.05*   Lab Results  Component Value Date   CHOL 208* 03/31/2015   HDL 43* 03/31/2015   LDLCALC 96 03/31/2015   TRIG 347* 03/31/2015   Lab Results  Component Value Date   DDIMER 0.48 10/29/2014     Radiology:  Dg Chest 2 View  06/23/2015   CLINICAL DATA:  Shortness of breath and chest pain  EXAM: CHEST  2 VIEW  COMPARISON:  June 14, 2015  FINDINGS: There is no edema or consolidation. Heart is upper normal in size with pulmonary vascularity within normal limits. No adenopathy. No bone lesions.  IMPRESSION: No edema or consolidation.   Electronically Signed   By: Lowella Grip III M.D.   On: 06/23/2015 01:24   Personally viewed.  EKG:  06/22/15 at 2351 Atrial fibrillation, heart rate 136 bpm, nonspecific ST-T wave changes. Prior EKG on 04/29/2015 showed sinus rhythm. Personally viewed.   Echocardiogram: 10/30/14-ejection fraction 40-45% with inferior wall hypokinesis.  Scheduled Meds: . aspirin EC  81 mg Oral Daily  . carvedilol  6.25 mg Oral BID WC  . clopidogrel  75 mg Oral Q breakfast  . furosemide  60 mg Oral BID  . gabapentin  400 mg Oral QID  . glipiZIDE  30 mg Oral Q breakfast  . insulin aspart  0-9 Units Subcutaneous TID WC  . insulin glargine  40 Units Subcutaneous QHS  . levalbuterol  0.63 mg Nebulization Q6H  . levothyroxine  50 mcg Oral QAC breakfast  . lisinopril  20  mg Oral Daily  . pantoprazole  40 mg Oral Daily  . pravastatin  40 mg Oral q1800  . rivaroxaban  20 mg Oral Q supper  . sodium  chloride  3 mL Intravenous Q12H   Continuous Infusions: . diltiazem (CARDIZEM) infusion     PRN Meds:.acetaminophen **OR** acetaminophen, levalbuterol, nitroGLYCERIN, ondansetron **OR** ondansetron (ZOFRAN) IV   ASSESSMENT/PLAN:    54 year old female with paroxysmal atrial fibrillation, COPD, diabetes admitted with atrial fibrillation with rapid ventricular response with concomitant chest discomfort, minimally elevated troponin.  1. Atrial fibrillation, paroxysmal with rapid ventricular response  - CHADSVASc at least 4 (F, HTN, DM, PVD)  - Appropriately anticoagulated with Xarelto. She states that she has not missed any doses but she has just restarted since last hospitalization.  - On carvedilol 6.25 mg twice a day at home. Received diltiazem in the emergency room.  - If blood pressure is able to tolerate, we will increase carvedilol to 12.5 mg twice a day after stabilized with IV diltiazem. It seems best to utilize beta blocker in this situation given her underlying cardiomyopathy.  - May need to proceed with TEE/CV if no conversion.   2. Elevated troponin/chest pain  - Minimally elevated, may be a component of demand ischemia in the setting of rapid tachycardia.  - Last nuclear stress test was on 09/09/13  - If chest pain continued, it would not be unreasonable to repeat nuclear stress test in the outpatient setting.  3. Peripheral vascular disease-she has seen Dr. Gwenlyn Found, atherectomy of right SFA left SFA is known to be occluded history of left carotid endarterectomy.  4. Chronic systolic heart failure - history of non-compliance. Mild LE edema. Continue lasix.  Karla Furbish, MD  06/23/2015  8:46 AM

## 2015-06-23 NOTE — Progress Notes (Signed)
CBG 316, notified K. Schorr with [facility]. Awaiting any new orders.

## 2015-06-24 ENCOUNTER — Telehealth: Payer: Self-pay

## 2015-06-24 LAB — BASIC METABOLIC PANEL
Anion gap: 10 (ref 5–15)
BUN: 27 mg/dL — ABNORMAL HIGH (ref 6–20)
CO2: 27 mmol/L (ref 22–32)
Calcium: 8.4 mg/dL — ABNORMAL LOW (ref 8.9–10.3)
Chloride: 96 mmol/L — ABNORMAL LOW (ref 101–111)
Creatinine, Ser: 0.99 mg/dL (ref 0.44–1.00)
GFR calc Af Amer: >60 mL/min (ref >60)
GLUCOSE: 405 mg/dL — AB (ref 65–99)
POTASSIUM: 3.9 mmol/L (ref 3.5–5.1)
SODIUM: 133 mmol/L — AB (ref 135–145)

## 2015-06-24 LAB — GLUCOSE, CAPILLARY
GLUCOSE-CAPILLARY: 290 mg/dL — AB (ref 65–99)
GLUCOSE-CAPILLARY: 390 mg/dL — AB (ref 65–99)
Glucose-Capillary: 256 mg/dL — ABNORMAL HIGH (ref 65–99)
Glucose-Capillary: 342 mg/dL — ABNORMAL HIGH (ref 65–99)
Glucose-Capillary: 252 mg/dL — ABNORMAL HIGH (ref 65–99)

## 2015-06-24 LAB — CBC
HEMATOCRIT: 30.1 % — AB (ref 36.0–46.0)
HEMOGLOBIN: 9.6 g/dL — AB (ref 12.0–15.0)
MCH: 28.2 pg (ref 26.0–34.0)
MCHC: 31.9 g/dL (ref 30.0–36.0)
MCV: 88.5 fL (ref 78.0–100.0)
Platelets: 275 10*3/uL (ref 150–400)
RBC: 3.4 MIL/uL — AB (ref 3.87–5.11)
RDW: 14.9 % (ref 11.5–15.5)
WBC: 8.5 10*3/uL (ref 4.0–10.5)

## 2015-06-24 LAB — MAGNESIUM: Magnesium: 1.6 mg/dL — ABNORMAL LOW (ref 1.7–2.4)

## 2015-06-24 MED ORDER — INSULIN GLARGINE 100 UNIT/ML ~~LOC~~ SOLN
25.0000 [IU] | Freq: Two times a day (BID) | SUBCUTANEOUS | Status: DC
  Administered 2015-06-24 – 2015-06-25 (×3): 25 [IU] via SUBCUTANEOUS
  Filled 2015-06-24 (×4): qty 0.25

## 2015-06-24 MED ORDER — INSULIN ASPART 100 UNIT/ML ~~LOC~~ SOLN: 3.0000 [IU] | Freq: Three times a day (TID) | SUBCUTANEOUS | Status: DC

## 2015-06-24 MED ORDER — INSULIN ASPART 100 UNIT/ML ~~LOC~~ SOLN
0.0000 [IU] | Freq: Every day | SUBCUTANEOUS | Status: DC
  Administered 2015-06-24: 3 [IU] via SUBCUTANEOUS

## 2015-06-24 MED ORDER — MAGNESIUM SULFATE 2 GM/50ML IV SOLN
2.0000 g | Freq: Once | INTRAVENOUS | Status: AC
  Administered 2015-06-24: 2 g via INTRAVENOUS
  Filled 2015-06-24: qty 50

## 2015-06-24 MED ORDER — INSULIN ASPART 100 UNIT/ML ~~LOC~~ SOLN
6.0000 [IU] | Freq: Three times a day (TID) | SUBCUTANEOUS | Status: DC
  Administered 2015-06-24 – 2015-06-25 (×3): 6 [IU] via SUBCUTANEOUS

## 2015-06-24 MED ORDER — INSULIN ASPART 100 UNIT/ML ~~LOC~~ SOLN
0.0000 [IU] | Freq: Three times a day (TID) | SUBCUTANEOUS | Status: DC
  Administered 2015-06-24: 11 [IU] via SUBCUTANEOUS
  Administered 2015-06-24: 15 [IU] via SUBCUTANEOUS
  Administered 2015-06-25: 3 [IU] via SUBCUTANEOUS

## 2015-06-24 NOTE — Telephone Encounter (Signed)
Call placed to [name]- Cherlyn Cushing, CM to inquire about a discharge plan for the patient.  She reported that the patient is currently being treated for a UTI.  At this time, no discharge planned for today.

## 2015-06-24 NOTE — Progress Notes (Signed)
UR Completed Parlee Amescua Graves-Bigelow, RN,BSN [phone]  

## 2015-06-24 NOTE — Progress Notes (Signed)
Patient Demographics:    [name], is a 54 y.o. female, DOB - [birth year], [id number]  Admit date - 06/22/2015   Admitting Physician [name], MD  Outpatient Primary MD for the patient is [name], [name], MD  LOS - 1   Chief Complaint  Patient presents with  . Chest Pain  . Shortness of Breath  . Irregular Heart Beat        Subjective:    [name] today has, No headache, No chest pain, No abdominal pain - No Nausea, No new weakness tingling or numbness, No Cough - SOB.     Assessment  & Plan :     1.Chr.Afib with RVR [name] 2 - 4 - Cards on board, currently on combination of Coreg , diltiazem stopped by cardiology, on xaralto which will be continued. Monitor on telemetry if remains rate controlled discharge in the morning.   2. Mildly elevated troponin and non-ACS pattern. Secondary to demand ischemia from RVR, chest pain-free, EKG nonacute, cardiology on board. Continue ASA, Coreg, statin along with xaralto as above.     3. Dyslipidemia. Continue home statin.   4. PAD along with carotid artery stenosis. Continue antiplatelets and statin for secondary prevention.   5. Chronic systolic heart failure last EF 45% on December 2015 echocardiogram. Trace edema on exam, she is on a combination of Coreg, Lasix, ACE inhibitor which will be continued. Place on salt and fluid restriction. Monitor clinically.   6. GERD. Continue home dose PPI.    7. UTI. On Rocephin monitor.    8. DM type II. Have adjusted Lantus, added premeal Novolog, continue glimepiride along with sliding scale.  Lab Results  Component Value Date   HGBA1C 8.7* 06/16/2015    CBG (last 3)   Recent Labs  06/23/15 2026 06/24/15 0019 06/24/15 0733  GLUCAP 316* 290* 256*      Code Status :  Full  Family Communication  : None present  Disposition Plan  : Home in 1-2 days  Consults  : Cardiology  Procedures  :   TTE 10-2014  Technically difficult study. Compared to the prior echo in 2014,the EF is reduced to around 45% with what appears to be inferoapical and posterior hypokinesis.   DVT Prophylaxis  :  Xaralto  Lab Results  Component Value Date   PLT 275 06/24/2015    Inpatient Medications  Scheduled Meds: . aspirin EC  81 mg Oral Daily  . carvedilol  12.5 mg Oral BID WC  . cefTRIAXone (ROCEPHIN)  IV  1 g Intravenous Q24H  . clopidogrel  75 mg Oral Q breakfast  . furosemide  60 mg Oral BID  . gabapentin  400 mg Oral QID  . glipiZIDE  30 mg Oral Q breakfast  . insulin aspart  0-15 Units Subcutaneous TID WC  . insulin aspart  0-5 Units Subcutaneous QHS  . insulin aspart  6 Units Subcutaneous TID WC  . insulin glargine  25 Units Subcutaneous BID  . levothyroxine  50 mcg Oral QAC breakfast  . lisinopril  20 mg Oral Daily  . pantoprazole  40 mg Oral Daily  . pravastatin  40 mg Oral q1800  . rivaroxaban  20 mg Oral Q supper  . sodium  chloride  3 mL Intravenous Q12H   Continuous Infusions:   PRN Meds:.acetaminophen **OR** acetaminophen, levalbuterol, nitroGLYCERIN, ondansetron **OR** ondansetron (ZOFRAN) IV  Antibiotics  :    Anti-infectives    Start     Dose/Rate Route Frequency Ordered Stop   06/23/15 1145  cefTRIAXone (ROCEPHIN) 1 g in dextrose 5 % 50 mL IVPB     1 g 100 mL/hr over 30 Minutes Intravenous Every 24 hours 06/23/15 1130          Objective:   Filed Vitals:   06/24/15 0016 06/24/15 0400 06/24/15 0734 06/24/15 1058  BP: 133/58 142/64 136/73 130/68  Pulse: 80 79 76   Temp: 98.9 F (37.2 C) 98.2 F (36.8 C) 97.8 F (36.6 C)   TempSrc: Oral Oral Oral   Resp:   15   Height:      Weight:  100.336 kg (221 lb 3.2 oz)    SpO2: 98% 99% 98%     Wt Readings from Last 3 Encounters:  06/24/15 100.336 kg (221 lb 3.2 oz)  06/19/15  102.422 kg (225 lb 12.8 oz)  05/03/15 100 kg (220 lb 7.4 oz)     Intake/Output Summary (Last 24 hours) at 06/24/15 1105 Last data filed at 06/24/15 0900  Gross per 24 hour  Intake    420 ml  Output    250 ml  Net    170 ml     Physical Exam  Awake Alert, Oriented X 3, No new F.N deficits, Normal affect [place].AT,PERRAL Supple Neck,No JVD, No cervical lymphadenopathy appriciated.  Symmetrical Chest wall movement, Good air movement bilaterally, CTAB iRRR,No Gallops,Rubs or new Murmurs, No Parasternal Heave +ve B.Sounds, Abd Soft, No tenderness, No organomegaly appriciated, No rebound - guarding or rigidity. No Cyanosis, Clubbing or edema, No new Rash or bruise       Data Review:   Micro Results Recent Results (from the past 240 hour(s))  MRSA PCR Screening     Status: None   Collection Time: 06/23/15  6:29 PM  Result Value Ref Range Status   MRSA by PCR NEGATIVE NEGATIVE Final    Comment:        The GeneXpert MRSA Assay (FDA approved for NASAL specimens only), is one component of a comprehensive MRSA colonization surveillance program. It is not intended to diagnose MRSA infection nor to guide or monitor treatment for MRSA infections.     Radiology Reports Dg Chest 2 View  06/23/2015   CLINICAL DATA:  Shortness of breath and chest pain  EXAM: CHEST  2 VIEW  COMPARISON:  June 14, 2015  FINDINGS: There is no edema or consolidation. Heart is upper normal in size with pulmonary vascularity within normal limits. No adenopathy. No bone lesions.  IMPRESSION: No edema or consolidation.   Electronically Signed   By: Lowella Grip III M.D.   On: 06/23/2015 01:24   Dg Chest 2 View  06/14/2015   CLINICAL DATA:  Dyspnea. Cough, shortness of breath, nausea and headache.  EXAM: CHEST  2 VIEW  COMPARISON:  04/29/2015  FINDINGS: Lung volumes are low. Heart remains at the upper limits of normal in size. Prominent interstitial opacities suspicious for pulmonary edema, similar to prior  exam. Small right pleural effusion and fluid in the right minor fissure. Mild atelectasis at the right lung base. No consolidation to suggest pneumonia. No pneumothorax. No acute osseous abnormality is seen.  IMPRESSION: 1. Stable borderline cardiomegaly and interstitial prominence suspicious for pulmonary edema. 2. Small right pleural effusion,  new.   Electronically Signed   By: Jeb Levering M.D.   On: 06/14/2015 05:43     CBC  Recent Labs Lab 06/23/15 0005 06/23/15 0527 06/24/15 0932  WBC 11.1* 13.7* 8.5  HGB 10.5* 11.2* 9.6*  HCT 32.6* 35.8* 30.1*  PLT 326 342 275  MCV 87.6 87.5 88.5  MCH 28.2 27.4 28.2  MCHC 32.2 31.3 31.9  RDW 14.5 14.7 14.9  LYMPHSABS  --  2.2  --   MONOABS  --  1.1*  --   EOSABS  --  0.3  --   BASOSABS  --  0.0  --     Chemistries   Recent Labs Lab 06/18/15 0359 06/19/15 0414 06/23/15 0005 06/23/15 0527 06/24/15 0932  NA 137 133* 138 139 133*  K 4.0 4.4 3.9 3.7 3.9  CL 98* 95* 102 101 96*  CO2 33* 29 28 27 27   GLUCOSE 171* 241* 245* 253* 405*  BUN 27* 32* 20 25* 27*  CREATININE 0.95 1.10* 1.02* 1.00 0.99  CALCIUM 8.5* 8.0* 8.8* 8.7* 8.4*  MG  --   --   --   --  1.6*  AST  --   --   --  24  --   ALT  --   --   --  30  --   ALKPHOS  --   --   --  194*  --   BILITOT  --   --   --  0.4  --    ------------------------------------------------------------------------------------------------------------------ estimated creatinine clearance is 76.5 mL/min (by C-G formula based on Cr of 0.99). ------------------------------------------------------------------------------------------------------------------ No results for input(s): HGBA1C in the last 72 hours. ------------------------------------------------------------------------------------------------------------------ No results for input(s): CHOL, HDL, LDLCALC, TRIG, CHOLHDL, LDLDIRECT in the last 72  hours. ------------------------------------------------------------------------------------------------------------------  Recent Labs  06/23/15 0044  TSH 1.647   ------------------------------------------------------------------------------------------------------------------ No results for input(s): VITAMINB12, FOLATE, FERRITIN, TIBC, IRON, RETICCTPCT in the last 72 hours.  Coagulation profile  Recent Labs Lab 06/23/15 0005  INR 1.92*    No results for input(s): DDIMER in the last 72 hours.  Cardiac Enzymes  Recent Labs Lab 06/23/15 0527 06/23/15 1117 06/23/15 1700  TROPONINI 0.05* 0.06* 0.11*   ------------------------------------------------------------------------------------------------------------------ Invalid input(s): POCBNP   Time Spent in minutes  35   SINGH,PRASHANT K M.D on 06/24/2015 at 11:05 AM  Between 7am to 7pm - Pager - [phone]  After 7pm go to [email or web address] - password [facility]  [facility] -  Office  [phone]

## 2015-06-24 NOTE — Progress Notes (Signed)
Inpatient Diabetes Program Recommendations  AACE/ADA: New Consensus Statement on Inpatient Glycemic Control (2013)  Target Ranges:  Prepandial:   less than 140 mg/dL      Peak postprandial:   less than 180 mg/dL (1-2 hours)      Critically ill patients:  140 - 180 mg/dL    Results for [name], [name] (MRN [id number]) as of 06/24/2015 10:04  Ref. Range 06/23/2015 08:42 06/23/2015 12:08 06/23/2015 16:00 06/23/2015 20:26  Glucose-Capillary Latest Ref Range: 65-99 mg/dL 278 (H) 321 (H) 236 (H) 316 (H)    Results for [name], [name] (MRN [id number]) as of 06/24/2015 10:04  Ref. Range 06/24/2015 07:33  Glucose-Capillary Latest Ref Range: 65-99 mg/dL 256 (H)     Admit with: A Fib w/ RVR  History: DM, CHF, COPD, Cirrhosis  Home DM Meds: Lantus 40 units QHS       Novolog 7-10 units tidwc per SSI       Glipizide 30 mg daily       Metformin 1000 mg bid  Current DM Orders: Lantus 40 units QHS            Novolog Moderate SSI (0-15 units) TID AC + HS            Novolog 3 units tidwc            Glipizide 30 mg daily    -Note Novolog SSI increased to Moderate scale today.  -Also note Novolog 3 units Meal Coverage started today as well.    MD- If patient continues to have elevated fasting glucose levels, please consider increasing Lantus to 48 units QHS (20% increase)    Will follow Wyn Quaker RN, MSN, CDE Diabetes Coordinator Inpatient Glycemic Control Team Team Pager: [phone] (8a-5p)

## 2015-06-24 NOTE — Progress Notes (Signed)
Patient Name: [name] Date of Encounter: 06/24/2015   SUBJECTIVE  Denies chest pain, sob or palpitations.   CURRENT MEDS . aspirin EC  81 mg Oral Daily  . carvedilol  12.5 mg Oral BID WC  . cefTRIAXone (ROCEPHIN)  IV  1 g Intravenous Q24H  . clopidogrel  75 mg Oral Q breakfast  . furosemide  60 mg Oral BID  . gabapentin  400 mg Oral QID  . glipiZIDE  30 mg Oral Q breakfast  . insulin aspart  0-15 Units Subcutaneous TID WC  . insulin aspart  0-5 Units Subcutaneous QHS  . insulin aspart  3 Units Subcutaneous TID WC  . insulin glargine  40 Units Subcutaneous QHS  . levothyroxine  50 mcg Oral QAC breakfast  . lisinopril  20 mg Oral Daily  . pantoprazole  40 mg Oral Daily  . pravastatin  40 mg Oral q1800  . rivaroxaban  20 mg Oral Q supper  . sodium chloride  3 mL Intravenous Q12H    OBJECTIVE  Filed Vitals:   06/23/15 2027 06/24/15 0016 06/24/15 0400 06/24/15 0734  BP: 137/60 133/58 142/64 136/73  Pulse: 81 80 79 76  Temp: 99.1 F (37.3 C) 98.9 F (37.2 C) 98.2 F (36.8 C) 97.8 F (36.6 C)  TempSrc: Oral Oral Oral Oral  Resp:    15  Height:      Weight:   221 lb 3.2 oz (100.336 kg)   SpO2: 95% 98% 99% 98%    Intake/Output Summary (Last 24 hours) at 06/24/15 0837 Last data filed at 06/23/15 2300  Gross per 24 hour  Intake    200 ml  Output    650 ml  Net   -450 ml   Filed Weights   06/23/15 0036 06/23/15 1603 06/24/15 0400  Weight: 220 lb (99.791 kg) 222 lb (100.699 kg) 221 lb 3.2 oz (100.336 kg)    PHYSICAL EXAM  General: Pleasant, NAD. Neuro: Alert and oriented X 3. Moves all extremities spontaneously. Psych: Normal affect. HEENT:  Normal  Neck: Supple without bruits or JVD. Lungs:  Resp regular and unlabored, CTA. Heart: RRR no s3, s4, or murmurs. Abdomen: Soft, non-tender, non-distended, BS + x 4.  Extremities: No clubbing, cyanosis or edema. DP 1+ and equal bilaterally.  Accessory Clinical Findings  CBC  Recent Labs  06/23/15 0005  06/23/15 0527  WBC 11.1* 13.7*  NEUTROABS  --  10.0*  HGB 10.5* 11.2*  HCT 32.6* 35.8*  MCV 87.6 87.5  PLT 326 [id number]   Basic Metabolic Panel  Recent Labs  06/23/15 0005 06/23/15 0527  NA 138 139  K 3.9 3.7  CL 102 101  CO2 28 27  GLUCOSE 245* 253*  BUN 20 25*  CREATININE 1.02* 1.00  CALCIUM 8.8* 8.7*   Liver Function Tests  Recent Labs  06/23/15 0527  AST 24  ALT 30  ALKPHOS 194*  BILITOT 0.4  PROT 6.6  ALBUMIN 3.1*   No results for input(s): LIPASE, AMYLASE in the last 72 hours. Cardiac Enzymes  Recent Labs  06/23/15 0527 06/23/15 1117 06/23/15 1700  TROPONINI 0.05* 0.06* 0.11*   Thyroid Function Tests  Recent Labs  06/23/15 0044  TSH 1.647    TELE  NSR  Radiology/Studies  Dg Chest 2 View  06/23/2015   CLINICAL DATA:  Shortness of breath and chest pain  EXAM: CHEST  2 VIEW  COMPARISON:  June 14, 2015  FINDINGS: There is no edema or consolidation. Heart is  upper normal in size with pulmonary vascularity within normal limits. No adenopathy. No bone lesions.  IMPRESSION: No edema or consolidation.   Electronically Signed   By: Lowella Grip III M.D.   On: 06/23/2015 01:24   Dg Chest 2 View  06/14/2015   CLINICAL DATA:  Dyspnea. Cough, shortness of breath, nausea and headache.  EXAM: CHEST  2 VIEW  COMPARISON:  04/29/2015  FINDINGS: Lung volumes are low. Heart remains at the upper limits of normal in size. Prominent interstitial opacities suspicious for pulmonary edema, similar to prior exam. Small right pleural effusion and fluid in the right minor fissure. Mild atelectasis at the right lung base. No consolidation to suggest pneumonia. No pneumothorax. No acute osseous abnormality is seen.  IMPRESSION: 1. Stable borderline cardiomegaly and interstitial prominence suspicious for pulmonary edema. 2. Small right pleural effusion, new.   Electronically Signed   By: Jeb Levering M.D.   On: 06/14/2015 05:43     ASSESSMENT AND PLAN  54 year old female  with paroxysmal atrial fibrillation, COPD, diabetes admitted with atrial fibrillation with rapid ventricular response with concomitant chest discomfort, minimally elevated troponin.  1. Atrial fibrillation, paroxysmal with rapid ventricular response - CHADSVASc at least 4 (F, HTN, DM, PVD) - Continue anticoagulation with Xarelto.  - Converted to NSR yesterday, Stopped IV dilt. Increased coreg to 12.5mg  BID. BP stable.  -  It seems best to utilize beta blocker in this situation given her underlying cardiomyopathy.  2. Elevated troponin/chest pain - Minimally elevated, may be a component of demand ischemia in the setting of rapid tachycardia. - Last nuclear stress test was on 09/09/13 - Currently no chest pain. If chest pain, it would not be unreasonable to repeat nuclear stress test in the outpatient setting.  3. Peripheral vascular disease-she has seen Dr. Gwenlyn Found, atherectomy of right SFA left SFA is known to be occluded history of left carotid endarterectomy.  4. Chronic systolic heart failure - history of non-compliance. Appears euvolemic. Continue lasix.  5. NSVT - Had 3 beats of NSVT overnight. Pending labs.   Signed, Bhagat,Bhavinkumar PA-C Pager [phone] Mildly elevated troponins are consistent with demand ischemia from rapid ventricular response.  She denies any chest pain.  Could consider outpatient nuclear stress test She is having no chest discomfort or dyspnea.  Rhythm remains normal sinus rhythm on current dose of carvedilol.  Currently she is on triple anticoagulation therapy.  I will stop her baby aspirin and we will continue with Xarelto and Plavix.  Okay for discharge soon from cardiac standpoint.

## 2015-06-24 NOTE — Progress Notes (Signed)
Patient had 9 beat run of Vtach, returned to [place] in the 80s, asymptomatic, resting in bed. Notified K. Schorr with [facility]. Will continue to closely monitor.

## 2015-06-25 ENCOUNTER — Inpatient Hospital Stay: Payer: Medicaid Other | Admitting: Family Medicine

## 2015-06-25 ENCOUNTER — Telehealth: Payer: Self-pay | Admitting: Cardiovascular Disease

## 2015-06-25 LAB — HEMOGLOBIN A1C
Hgb A1c MFr Bld: 9.4 % — ABNORMAL HIGH (ref 4.8–5.6)
Mean Plasma Glucose: 223 mg/dL

## 2015-06-25 LAB — GLUCOSE, CAPILLARY
GLUCOSE-CAPILLARY: 340 mg/dL — AB (ref 65–99)
Glucose-Capillary: 153 mg/dL — ABNORMAL HIGH (ref 65–99)

## 2015-06-25 MED ORDER — GABAPENTIN 400 MG PO CAPS
400.0000 mg | ORAL_CAPSULE | Freq: Four times a day (QID) | ORAL | Status: DC
Start: 2015-06-25 — End: 2015-06-26

## 2015-06-25 MED ORDER — CARVEDILOL 25 MG PO TABS: 25.0000 mg | ORAL_TABLET | Freq: Two times a day (BID) | ORAL | Status: DC

## 2015-06-25 MED ORDER — CARVEDILOL 25 MG PO TABS
25.0000 mg | ORAL_TABLET | Freq: Two times a day (BID) | ORAL | Status: DC
  Administered 2015-06-25: 25 mg via ORAL
  Filled 2015-06-25: qty 1

## 2015-06-25 MED ORDER — CARVEDILOL 25 MG PO TABS
25.0000 mg | ORAL_TABLET | Freq: Two times a day (BID) | ORAL | Status: DC
Start: 2015-06-25 — End: 2015-06-25

## 2015-06-25 NOTE — Telephone Encounter (Signed)
New message     TCM appt per Vin PA  appt on  8.18.2016 @ 8:00am

## 2015-06-25 NOTE — Care Management Note (Signed)
Case Management Note  Patient Details  Name: [name] MRN: [id number] Date of Birth: 1961/09/21  Subjective/Objective: Pt admitted for CP and SOB. Initiated on Cardizem gtt and changed to po.                  Action/Plan: Transitional Care Clinic: Appointment scheduled for hospital f/u once medically stable for d/c. CM did call [facility] for [facility] to see if they will be able to monitor post d/c. Liaison has to make sure that Medicaid is active. No further needs from CM at this time.    Expected Discharge Date:                  Expected Discharge Plan:  Home/Self Care  In-House Referral:     Discharge planning Services  CM Consult, Follow-up appt scheduled  Post Acute Care Choice:  NA Choice offered to:  NA  DME Arranged:  N/A DME Agency:  NA  HH Arranged:  NA HH Agency:  NA  Status of Service:  Completed, signed off  Medicare Important Message Given:    Date Medicare IM Given:    Medicare IM give by:    Date Additional Medicare IM Given:    Additional Medicare Important Message give by:     If discussed at [place] of Stay Meetings, dates discussed:    Additional Comments:  Bethena Roys, RN 06/25/2015, 10:17 AM

## 2015-06-25 NOTE — Progress Notes (Signed)
Reviewed discharge instructions with patient and sister and they stated their understanding.  Discharged home with family via wheelchair. Sanda Linger

## 2015-06-25 NOTE — Hospital Discharge Follow-Up (Signed)
Transitional Care Clinic Care Coordination Note:  Admit date:  06/23/15 Discharge date: 06/25/15 Discharge Disposition: Home Patient contact: [phone]   This Case Manager reviewed patient's EMR and determined patient would benefit from post-discharge medical management and chronic care management services through the [facility]. Patient has a history of chronic atrial fibrillation, COPD, chronic diastolic heart failure, diabetes mellitus type 2 who presented to ED on 06/23/15 with chest pain and palpitations. Patient has had 4 admissions in the last 6 months. Patient recently admitted 06/14/15-06/19/15 for acute respiratory failure likely secondary to congestive heart failure. This Case Manager met with patient to discuss the services and medical management that can be provided at the [facility]. Patient verbalized understanding and agreed to receive post-discharge care at the [facility].   Patient scheduled for Transitional Care appointment on 06/30/15 at 1200 with Dr. Jarold Song.  Clinic information and appointment time provided to patient. Appointment information also placed on AVS.  Assessment:       Home Environment: Patient living in a hotel ([employer] [facility] [place]) with her sister and friend for the last two months.  She does not know address to hotel.  Will need to follow-up with patient after discharge to determine address of hotel.       Support System: sister and friend, Jobe Gibbon       Level of functioning: independent       Home DME: walker, nebulizer.  Patient has a scale, but it has not been working well.  Patient indicates she has money to buy another scale. Encouraged patient to purchase working scale.       Home care services: Patient indicates she had personal care services through [facility]; however, Medicaid services recently ended per patient.       Transportation:  Patient indicates she used to utilize Medicaid transportation to get to her  appointments.  She recently lost Medicaid coverage, but she indicates she has reapplied. She also indicates her sister's car is not functional at this time so she will need transportation to her appointment on 06/30/15. Informed patient that [facility] can provide short-term transportation to appointments; however, long-term transportation arrangements will need to be determined. Patient verbalized understanding. [place], notified that transportation need to appointment on 06/30/15. Hotel address will be needed before transportation arrangements can be made.       Food/Nutrition: Patient indicates her sister shops for food. She indicates she has access to the food she needs.        Medications: Patient indicates she has used [facility] in the past; however, she used [facility] and [facility] for medications after discharge on 06/19/15. She plans to use [facility] and [facility] for any additional medications.  Reiterated pharmacy resources available at [place]. Patient verbalized understanding.        Identified Barriers: lack of insurance. Patient indicates she has reapplied for Medicaid.  [facility] walk-in hours. Additional barriers include: lack of medication coverage, lack of PCP, social situation-living in a hotel, lack of transportation to appointments.        PCP; Patient indicates she was using Dr. Huel Coventry as her PCP, but she is currently looking for a new PCP. Patient may need to establish care at [place] after 30 days of medical management at [facility].     Arranged services:        Services communicated to [employer]  Adelfa Koh, RN CM

## 2015-06-25 NOTE — Discharge Instructions (Signed)
Follow with Primary MD [name], [name], MD in 7 days   Get CBC, CMP, 2 view Chest X ray checked  by Primary MD next visit.    Activity: As tolerated with Full fall precautions use walker/cane & assistance as needed   Disposition Home     Diet: Heart Healthy Low Carb.  For Heart failure patients - Check your Weight same time everyday, if you gain over 2 pounds, or you develop in leg swelling, experience more shortness of breath or chest pain, call your Primary MD immediately. Follow Cardiac Low Salt Diet and 1.5 lit/day fluid restriction.   On your next visit with your primary care physician please Get Medicines reviewed and adjusted.   Please request your Prim.MD to go over all Hospital Tests and Procedure/Radiological results at the follow up, please get all Hospital records sent to your Prim MD by signing hospital release before you go home.   If you experience worsening of your admission symptoms, develop shortness of breath, life threatening emergency, suicidal or homicidal thoughts you must seek medical attention immediately by calling 911 or calling your MD immediately  if symptoms less severe.  You Must read complete instructions/literature along with all the possible adverse reactions/side effects for all the Medicines you take and that have been prescribed to you. Take any new Medicines after you have completely understood and accpet all the possible adverse reactions/side effects.   Do not drive, operating heavy machinery, perform activities at heights, swimming or participation in water activities or provide baby sitting services if your were admitted for syncope or siezures until you have seen by Primary MD or a Neurologist and advised to do so again.  Do not drive when taking Pain medications.    Do not take more than prescribed Pain, Sleep and Anxiety Medications  Special Instructions: If you have smoked or chewed Tobacco  in the last 2 yrs please stop smoking, stop any  regular Alcohol  and or any Recreational drug use.  Wear Seat belts while driving.   Please note  You were cared for by a hospitalist during your hospital stay. If you have any questions about your discharge medications or the care you received while you were in the hospital after you are discharged, you can call the unit and asked to speak with the hospitalist on call if the hospitalist that took care of you is not available. Once you are discharged, your primary care physician will handle any further medical issues. Please note that NO REFILLS for any discharge medications will be authorized once you are discharged, as it is imperative that you return to your primary care physician (or establish a relationship with a primary care physician if you do not have one) for your aftercare needs so that they can reassess your need for medications and monitor your lab values.   Atrial Fibrillation Atrial fibrillation is a condition that causes your heart to beat irregularly. It may also cause your heart to beat faster than normal. Atrial fibrillation can prevent your heart from pumping blood normally. It increases your risk of stroke and heart problems. HOME CARE  Take medications as told by your doctor.  Only take medications that your doctor says are safe. Some medications can make the condition worse or happen again.  If blood thinners were prescribed by your doctor, take them exactly as told. Too much can cause bleeding. Too little and you will not have the needed protection against stroke and other problems.  Perform blood  tests at home if told by your doctor.  Perform blood tests exactly as told by your doctor.  Do not drink alcohol.  Do not drink beverages with caffeine such as coffee, soda, and some teas.  Maintain a healthy weight.  Do not use diet pills unless your doctor says they are safe. They may make heart problems worse.  Follow diet instructions as told by your  doctor.  Exercise regularly as told by your doctor.  Keep all follow-up appointments. GET HELP IF:  You notice a change in the speed, rhythm, or strength of your heartbeat.  You suddenly begin peeing (urinating) more often.  You get tired more easily when moving or exercising. GET HELP RIGHT AWAY IF:   You have chest or belly (abdominal) pain.  You feel sick to your stomach (nauseous).  You are short of breath.  You suddenly have swollen feet and ankles.  You feel dizzy.  You face, arms, or legs feel numb or weak.  There is a change in your vision or speech. MAKE SURE YOU:   Understand these instructions.  Will watch your condition.  Will get help right away if you are not doing well or get worse. Document Released: 08/09/2008 Document Revised: 03/17/2014 Document Reviewed: 12/11/2012 [facility] Patient Information 2015 [place], [place]. This information is not intended to replace advice given to you by your health care provider. Make sure you discuss any questions you have with your health care provider.

## 2015-06-25 NOTE — Discharge Summary (Signed)
[name], is a 54 y.o. female  DOB 09-08-1961  MRN [id number].  Admission date:  06/22/2015  Admitting Physician  [name], MD  Discharge Date:  06/25/2015   Primary MD  Kevan Ny, MD  Recommendations for primary care physician for things to follow:   Monitor CBC, CMP closely. Monitor weight and diuretic dose.  Monitor heart rate blood pressure and Coreg dose.   Admission Diagnosis  Palpitations [R00.2] Shortness of breath [R06.02] Atrial fibrillation with RVR [I48.91]   Discharge Diagnosis  Palpitations [R00.2] Shortness of breath [R06.02] Atrial fibrillation with RVR [I48.91]     Principal Problem:   Atrial fibrillation with RVR Active Problems:   Hypothyroidism   Chronic diastolic CHF (congestive heart failure)   COPD (chronic obstructive pulmonary disease)   Diabetes mellitus type 2, controlled      Past Medical History  Diagnosis Date  . Alcohol abuse   . Narcotic abuse   . Cocaine abuse   . Marijuana abuse   . Tobacco abuse   . Alcoholic cirrhosis   . Cardiomyopathy   . Obesity   . CHF (congestive heart failure)   . Hypothyroidism   . GERD (gastroesophageal reflux disease)   . Atrial fibrillation   . Critical lower limb ischemia   . Hypertension   . CAD (coronary artery disease)     a. cath 11/10/2014 small vessel CAD. Demand ischemia in the setting of rapid a-fib  . Pneumonia     END MARCH 2016  . Anxiety   . Wrist fracture     LEFT; "just did xray and cast"  . DOE (dyspnea on exertion) 04/29/2015  . Poorly controlled type 2 diabetes mellitus   . Hyperlipemia   . Peripheral arterial disease     a. 01/2015 Angio/PTA: LSFA 100 w/ recon @ adductor canal and 1 vessel runoff via AT, RSFA 99 (atherectomy/pta) - 1 vessel runoff via diff dzs peroneal.  . Carotid artery  disease     a. 01/2015 Carotid Angio: RICA [id number], LICA [id number].  [name] COPD (chronic obstructive pulmonary disease)   . Arthritis     "hands, arms, shoulders, legs, back" (04/29/2015)  . Diabetic peripheral neuropathy     Past Surgical History  Procedure Laterality Date  . Cardioversion  ~ 02/2013    "twice"   . Lower extremity angiogram N/A 09/10/2013    Procedure: LOWER EXTREMITY ANGIOGRAM;  Surgeon: [name], MD;  Location: [facility] CATH LAB;  Service: Cardiovascular;  Laterality: N/A;  . Left heart catheterization with coronary angiogram N/A 10/31/2014    Procedure: LEFT HEART CATHETERIZATION WITH CORONARY ANGIOGRAM;  Surgeon: Burnell Blanks, MD;  Location: [facility] CATH LAB;  Service: Cardiovascular;  Laterality: N/A;  . Peripheral athrectomy Right 01/15/2015    SFA/notes 01/15/2015  . Balloon angioplasty, artery Right 01/15/2015    SFA/notes 01/15/2015  . Cardiac catheterization    . Lower extremity angiogram N/A 01/15/2015    Procedure: LOWER EXTREMITY ANGIOGRAM;  Surgeon: [name], MD;  Location: [facility]  CATH LAB;  Service: Cardiovascular;  Laterality: N/A;  . Carotid angiogram N/A 01/15/2015    Procedure: CAROTID ANGIOGRAM;  Surgeon: [name], MD;  Location: [facility] CATH LAB;  Service: Cardiovascular;  Laterality: N/A;  . Endarterectomy Left 02/19/2015    Procedure: LEFT CAROTID ENDARTERECTOMY ;  Surgeon: Serafina Mitchell, MD;  Location: [facility];  Service: Vascular;  Laterality: Left;  . Dilation and curettage of uterus  1988       HPI  from the history and physical done on the day of admission:    [name] is a 54 y.o. female with history of chronic atrial fibrillation, COPD, chronic diastolic heart failure, diabetes mellitus type 2 presents to the ER because of sudden onset of chest pain headache and palpitations. In the ER patient was found to have A. fib with RVR. Patient was started on Cardizem infusion and admitted for further control of heart rate. Presently patient is chest  pain-free. Patient has mild frontal headache for which patient is receiving Benadryl and Reglan. Patient denies any nausea vomiting or productive cough fever chills abdominal pain diarrhea or any focal deficits. Denies any visual symptoms. Patient was recently admitted for CHF.      Hospital Course:     1.Chr.Afib with RVR [name] 2 - 4 - Cards on board, her Coreg dose has been increased for rate control, she isn't rate controlled and cleared by cardio before discharge, currently symptom free, on xaralto which will be continued. Request PCP to continue monitoring blood pressure heart rate and Coreg dose. Outpatient cardiology follow-up post discharge.   2. Mildly elevated troponin and non-ACS pattern. Secondary to demand ischemia from RVR, chest pain-free, EKG nonacute, cardiology on board. Continue ASA, Coreg, statin along with xaralto as above.    3. Dyslipidemia. Continue home statin.   4. PAD along with carotid artery stenosis. Continue antiplatelets and statin for secondary prevention.   5. Chronic systolic heart failure last EF 45% on December 2015 echocardiogram. Trace edema on exam, she is on a combination of Coreg, Lasix, ACE inhibitor which will be continued. Placed on salt and fluid restriction upon discharge. Monitor clinically.   6. GERD. Continue home dose PPI.    7. UTI. Treated with Rocephin.    8. DM type II. continue home regimen, request PCP to monitor glycemic control.   Lab Results  Component Value Date   HGBA1C 9.4* 06/24/2015        Discharge Condition: Stable  Follow UP  Follow-up Information    Follow up with Melina Copa, PA-C On 07/02/2015.   Specialties:  Cardiology, Radiology   Why:  @8 :00am cardiolgy   Contact information:   [address] [place] [address] [phone]       Follow up with [facility]     On 06/30/2015.   Why:  Transitional Care Clinic appointment on 06/30/15 at  12:00 pm with Dr. Jarold Song.   Contact information:   [address] [place] [phone]      Follow up with [name], [name], MD. Schedule an appointment as soon as possible for a visit in 1 week.   Specialty:  Internal Medicine   Contact information:   [place] [address] [phone]       Follow up with [facility]. Schedule an appointment as soon as possible for a visit in 1 week.   Why:  Afib  Contact information:   [address] [place] [phone]        Consults obtained -  Cards  Diet and Activity recommendation: See Discharge Instructions below  Discharge Instructions       Discharge Instructions    Discharge instructions    Complete by:  As directed   Follow with Primary MD [name], [name], MD in 7 days   Get CBC, CMP, 2 view Chest X ray checked  by Primary MD next visit.    Activity: As tolerated with Full fall precautions use walker/cane & assistance as needed   Disposition Home     Diet: Heart Healthy Low Carb.  For Heart failure patients - Check your Weight same time everyday, if you gain over 2 pounds, or you develop in leg swelling, experience more shortness of breath or chest pain, call your Primary MD immediately. Follow Cardiac Low Salt Diet and 1.5 lit/day fluid restriction.   On your next visit with your primary care physician please Get Medicines reviewed and adjusted.   Please request your Prim.MD to go over all Hospital Tests and Procedure/Radiological results at the follow up, please get all Hospital records sent to your Prim MD by signing hospital release before you go home.   If you experience worsening of your admission symptoms, develop shortness of breath, life threatening emergency, suicidal or homicidal thoughts you must seek medical attention immediately by calling 911 or calling your MD immediately  if symptoms less severe.  You Must read  complete instructions/literature along with all the possible adverse reactions/side effects for all the Medicines you take and that have been prescribed to you. Take any new Medicines after you have completely understood and accpet all the possible adverse reactions/side effects.   Do not drive, operating heavy machinery, perform activities at heights, swimming or participation in water activities or provide baby sitting services if your were admitted for syncope or siezures until you have seen by Primary MD or a Neurologist and advised to do so again.  Do not drive when taking Pain medications.    Do not take more than prescribed Pain, Sleep and Anxiety Medications  Special Instructions: If you have smoked or chewed Tobacco  in the last 2 yrs please stop smoking, stop any regular Alcohol  and or any Recreational drug use.  Wear Seat belts while driving.   Please note  You were cared for by a hospitalist during your hospital stay. If you have any questions about your discharge medications or the care you received while you were in the hospital after you are discharged, you can call the unit and asked to speak with the hospitalist on call if the hospitalist that took care of you is not available. Once you are discharged, your primary care physician will handle any further medical issues. Please note that NO REFILLS for any discharge medications will be authorized once you are discharged, as it is imperative that you return to your primary care physician (or establish a relationship with a primary care physician if you do not have one) for your aftercare needs so that they can reassess your need for medications and monitor your lab values.     Increase activity slowly    Complete by:  As directed              Discharge Medications       Medication List    TAKE these medications        albuterol (  2.5 MG/3ML) 0.083% nebulizer solution  Commonly known as:  PROVENTIL  Take 2.5 mg by  nebulization every 6 (six) hours as needed. For wheezing.     albuterol 108 (90 BASE) MCG/ACT inhaler  Commonly known as:  PROVENTIL HFA;VENTOLIN HFA  Inhale 2 puffs into the lungs every 6 (six) hours as needed for wheezing or shortness of breath.     carvedilol 25 MG tablet  Commonly known as:  COREG  Take 1 tablet (25 mg total) by mouth 2 (two) times daily with a meal.     clopidogrel 75 MG tablet  Commonly known as:  PLAVIX  Take 1 tablet (75 mg total) by mouth daily with breakfast.     furosemide 40 MG tablet  Commonly known as:  LASIX  Take 1.5 tablets (60 mg total) by mouth 2 (two) times daily.     gabapentin 400 MG capsule  Commonly known as:  NEURONTIN  Take 400 mg by mouth 4 (four) times daily.     glipiZIDE 10 MG 24 hr tablet  Commonly known as:  GLUCOTROL XL  Take 30 mg by mouth daily with breakfast.     insulin aspart 100 UNIT/ML injection  Commonly known as:  novoLOG  Inject 7-10 Units into the skin 3 (three) times daily with meals. Sliding scale, usually takes 10 units in am, 7 units in pm     insulin glargine 100 UNIT/ML injection  Commonly known as:  LANTUS  Inject 0.4 mLs (40 Units total) into the skin at bedtime.     levothyroxine 50 MCG tablet  Commonly known as:  SYNTHROID, LEVOTHROID  Take 50 mcg by mouth daily.     lisinopril 20 MG tablet  Commonly known as:  PRINIVIL,ZESTRIL  Take 20 mg by mouth daily.     metFORMIN 1000 MG tablet  Commonly known as:  GLUCOPHAGE  Take 1,000 mg by mouth 2 (two) times daily with a meal.     nitroGLYCERIN 0.4 MG SL tablet  Commonly known as:  NITROSTAT  Place 1 tablet (0.4 mg total) under the tongue every 5 (five) minutes as needed for chest pain.     pantoprazole 40 MG tablet  Commonly known as:  PROTONIX  Take 40 mg by mouth daily.     pravastatin 80 MG tablet  Commonly known as:  PRAVACHOL  Take 1 tablet (80 mg total) by mouth daily at 6 PM.     rivaroxaban 20 MG Tabs tablet  Commonly known as:   XARELTO  Take 20 mg by mouth daily with supper.        Major procedures and Radiology Reports - PLEASE review detailed and final reports for all details, in brief -     Dg Chest 2 View  06/23/2015   CLINICAL DATA:  Shortness of breath and chest pain  EXAM: CHEST  2 VIEW  COMPARISON:  June 14, 2015  FINDINGS: There is no edema or consolidation. Heart is upper normal in size with pulmonary vascularity within normal limits. No adenopathy. No bone lesions.  IMPRESSION: No edema or consolidation.   Electronically Signed   By: Lowella Grip III M.D.   On: 06/23/2015 01:24   Dg Chest 2 View  06/14/2015   CLINICAL DATA:  Dyspnea. Cough, shortness of breath, nausea and headache.  EXAM: CHEST  2 VIEW  COMPARISON:  04/29/2015  FINDINGS: Lung volumes are low. Heart remains at the upper limits of normal in size. Prominent interstitial opacities suspicious for pulmonary edema,  similar to prior exam. Small right pleural effusion and fluid in the right minor fissure. Mild atelectasis at the right lung base. No consolidation to suggest pneumonia. No pneumothorax. No acute osseous abnormality is seen.  IMPRESSION: 1. Stable borderline cardiomegaly and interstitial prominence suspicious for pulmonary edema. 2. Small right pleural effusion, new.   Electronically Signed   By: Jeb Levering M.D.   On: 06/14/2015 05:43    Micro Results      Recent Results (from the past 240 hour(s))  MRSA PCR Screening     Status: None   Collection Time: 06/23/15  6:29 PM  Result Value Ref Range Status   MRSA by PCR NEGATIVE NEGATIVE Final    Comment:        The GeneXpert MRSA Assay (FDA approved for NASAL specimens only), is one component of a comprehensive MRSA colonization surveillance program. It is not intended to diagnose MRSA infection nor to guide or monitor treatment for MRSA infections.        Today   Subjective    [name] today has no headache,no chest abdominal pain,no new weakness  tingling or numbness, feels much better wants to go home today.    Objective   Blood pressure 132/91, pulse 96, temperature 97.7 F (36.5 C), temperature source Oral, resp. rate 15, height 5\' 4"  (1.626 m), weight 101.651 kg (224 lb 1.6 oz), last menstrual period 11/27/2012, SpO2 100 %.   Intake/Output Summary (Last 24 hours) at 06/25/15 1127 Last data filed at 06/25/15 0900  Gross per 24 hour  Intake   1130 ml  Output      0 ml  Net   1130 ml    Exam Awake Alert, Oriented x 3, No new F.N deficits, Normal affect [place].AT,PERRAL Supple Neck,No JVD, No cervical lymphadenopathy appriciated.  Symmetrical Chest wall movement, Good air movement bilaterally, CTAB RRR,No Gallops,Rubs or new Murmurs, No Parasternal Heave +ve B.Sounds, Abd Soft, Non tender, No organomegaly appriciated, No rebound -guarding or rigidity. No Cyanosis, Clubbing or edema, No new Rash or bruise   Data Review   CBC w Diff: Lab Results  Component Value Date   WBC 8.5 06/24/2015   HGB 9.6* 06/24/2015   HCT 30.1* 06/24/2015   PLT 275 06/24/2015   LYMPHOPCT 16 06/23/2015   MONOPCT 8 06/23/2015   EOSPCT 2 06/23/2015   BASOPCT 0 06/23/2015    CMP: Lab Results  Component Value Date   NA 133* 06/24/2015   K 3.9 06/24/2015   CL 96* 06/24/2015   CO2 27 06/24/2015   BUN 27* 06/24/2015   CREATININE 0.99 06/24/2015   CREATININE 0.85 01/08/2015   PROT 6.6 06/23/2015   ALBUMIN 3.1* 06/23/2015   BILITOT 0.4 06/23/2015   ALKPHOS 194* 06/23/2015   AST 24 06/23/2015   ALT 30 06/23/2015  .   Total Time in preparing paper work, data evaluation and todays exam - 35 minutes  Thurnell Lose M.D on 06/25/2015 at 11:27 AM  [facility]   Office  [phone]

## 2015-06-26 ENCOUNTER — Telehealth: Payer: Self-pay | Admitting: Family Medicine

## 2015-06-26 ENCOUNTER — Other Ambulatory Visit: Payer: Self-pay | Admitting: *Deleted

## 2015-06-26 ENCOUNTER — Telehealth: Payer: Self-pay

## 2015-06-26 ENCOUNTER — Encounter: Payer: Self-pay | Admitting: *Deleted

## 2015-06-26 DIAGNOSIS — Z006 Encounter for examination for normal comparison and control in clinical research program: Secondary | ICD-10-CM

## 2015-06-26 DIAGNOSIS — E1165 Type 2 diabetes mellitus with hyperglycemia: Secondary | ICD-10-CM

## 2015-06-26 MED ORDER — TRUEPLUS LANCETS 28G MISC: 1.0000 | Freq: Three times a day (TID) | Status: DC

## 2015-06-26 MED ORDER — GLUCOSE BLOOD VI STRP: ORAL_STRIP | Status: DC

## 2015-06-26 MED ORDER — GABAPENTIN 400 MG PO CAPS: 400.0000 mg | ORAL_CAPSULE | Freq: Four times a day (QID) | ORAL | Status: DC

## 2015-06-26 MED ORDER — TRUE METRIX METER DEVI: 1.0000 | Freq: Three times a day (TID) | Status: DC

## 2015-06-26 NOTE — Telephone Encounter (Signed)
Transitional [facility] Clinic Post-discharge Follow-Up Phone Call:  Date of Discharge: 06/25/15 Principal Discharge Diagnosis(es): Atrial fibrillation with RVR, hypothyroidism, chronic diastolic CHF, COPD, diabetes mellitus type 2 Post-discharge Communication: Call placed to [phone] and discharge follow-up phone call completed. Call Completed: Yes                  With Whom: Patient   Please check all that apply:  X  Patient is knowledgeable of his/her condition(s) and/or treatment. X  Patient is caring for self at home.  ? Patient is receiving assist at home from family and/or caregiver. Family and/or caregiver is knowledgeable of patient's condition(s) and/or treatment. ? Patient is receiving home health services. If so, name of agency.     Medication Reconciliation:  X  Medication list reviewed with patient. X  Patient obtained all discharge medications-No.  Patient indicates she came to [facility] after discharge and was informed that prescriptions were not sent to pharmacy. Patient indicated her prescriptions were sent to [facility]. She indicated she has asked [facility] to transfer medications to [facility].  This Case Manager spoke with Orland Dec, Pharmacy Tech at [facility], who indicates [facility] has transferred patient's medication profile; however, the script for neurontin was noted to be printed at discharge.  She indicates all other medications will be ready for pick-up today.  This Case Manager spoke with patient, and she indicates she did not receive a prescription for neurontin at discharge. Spoke with Dr. Jarold Song who indicates neurontin could be reordered and electronically sent to [facility] and [facility]. Verbal order entered. Patient updated and informed medications will be ready for pick-up today. Patient appreciative and  verbalized understanding.   Activities of Daily Living:  X  Independent-Patient uses a walker for mobility. ? Needs assist  ? Total Care   Community resources in place for patient:  X  None  ? Home Health/Home DME ? Assisted Living ? Support Group          Patient Education: Comptroller and [facility] resources as well as Development worker, community walk-in hours. Discussed importance of medication compliance.        Questions/Concerns discussed:  Patient indicates she is aware of Transitional Care Clinic appointment on 06/30/15 at 1200 with Dr. Jarold Song. She indicates transportation needed. Patient indicates she is staying at [facility] ([address] [name], [place], [place] [address]). [facility] scheduler, updated so transportation can be arranged for appointment on 06/30/15. Patient also indicates she has been experiencing foot pain that "kept [her] up all night" because she is out of Neurontin.  Patient's medications will be filled today at [facility], and patient aware Neurontin reordered. Patient indicates she plans to pick-up medications today. Patient asked to bring all medications to her appointment on 06/30/15. No additional needs identified.

## 2015-06-26 NOTE — Telephone Encounter (Signed)
Patient called requesting to speak to nurse, please f/u

## 2015-06-26 NOTE — Telephone Encounter (Signed)
TCM call no answer.[place].

## 2015-06-26 NOTE — Progress Notes (Signed)
Called [name] for her 6 month follow-up for the [facility] registry. She states she has been hospitalized but has not had any problems relating to her lower extremity  intervention in March.

## 2015-06-29 NOTE — Telephone Encounter (Signed)
TCM call to patient.Patient stated she is doing good.She understands discharge instructions and is taking medications as prescribed.Advised to keep post hospital appointment with Melina Copa PA 07/02/15 at 8:00 am at [name] office.

## 2015-06-30 ENCOUNTER — Telehealth: Payer: Self-pay | Admitting: Family Medicine

## 2015-06-30 ENCOUNTER — Other Ambulatory Visit: Payer: Self-pay | Admitting: *Deleted

## 2015-06-30 ENCOUNTER — Ambulatory Visit: Payer: Medicaid Other | Attending: Family Medicine | Admitting: Family Medicine

## 2015-06-30 ENCOUNTER — Encounter (HOSPITAL_BASED_OUTPATIENT_CLINIC_OR_DEPARTMENT_OTHER): Payer: Self-pay | Admitting: Clinical

## 2015-06-30 ENCOUNTER — Encounter: Payer: Self-pay | Admitting: Family Medicine

## 2015-06-30 VITALS — BP 133/81 | HR 88 | Temp 98.6°F | Ht 64.0 in | Wt 211.0 lb

## 2015-06-30 DIAGNOSIS — I5033 Acute on chronic diastolic (congestive) heart failure: Secondary | ICD-10-CM | POA: Diagnosis not present

## 2015-06-30 DIAGNOSIS — M25569 Pain in unspecified knee: Secondary | ICD-10-CM | POA: Diagnosis not present

## 2015-06-30 DIAGNOSIS — E1165 Type 2 diabetes mellitus with hyperglycemia: Secondary | ICD-10-CM

## 2015-06-30 DIAGNOSIS — I739 Peripheral vascular disease, unspecified: Secondary | ICD-10-CM

## 2015-06-30 DIAGNOSIS — Z9889 Other specified postprocedural states: Secondary | ICD-10-CM

## 2015-06-30 DIAGNOSIS — Z7901 Long term (current) use of anticoagulants: Secondary | ICD-10-CM | POA: Insufficient documentation

## 2015-06-30 DIAGNOSIS — Z9862 Peripheral vascular angioplasty status: Secondary | ICD-10-CM

## 2015-06-30 DIAGNOSIS — Z794 Long term (current) use of insulin: Secondary | ICD-10-CM | POA: Insufficient documentation

## 2015-06-30 DIAGNOSIS — E1140 Type 2 diabetes mellitus with diabetic neuropathy, unspecified: Secondary | ICD-10-CM | POA: Diagnosis not present

## 2015-06-30 DIAGNOSIS — IMO0002 Reserved for concepts with insufficient information to code with codable children: Secondary | ICD-10-CM

## 2015-06-30 DIAGNOSIS — I4891 Unspecified atrial fibrillation: Secondary | ICD-10-CM | POA: Insufficient documentation

## 2015-06-30 DIAGNOSIS — I779 Disorder of arteries and arterioles, unspecified: Secondary | ICD-10-CM | POA: Diagnosis not present

## 2015-06-30 DIAGNOSIS — I10 Essential (primary) hypertension: Secondary | ICD-10-CM | POA: Insufficient documentation

## 2015-06-30 DIAGNOSIS — Z659 Problem related to unspecified psychosocial circumstances: Secondary | ICD-10-CM

## 2015-06-30 DIAGNOSIS — E039 Hypothyroidism, unspecified: Secondary | ICD-10-CM | POA: Insufficient documentation

## 2015-06-30 LAB — GLUCOSE, POCT (MANUAL RESULT ENTRY): POC Glucose: 283 mg/dl — AB (ref 70–99)

## 2015-06-30 MED ORDER — RIVAROXABAN 20 MG PO TABS: 20.0000 mg | ORAL_TABLET | Freq: Every day | ORAL | Status: DC

## 2015-06-30 MED ORDER — INSULIN GLARGINE 100 UNIT/ML ~~LOC~~ SOLN: 45.0000 [IU] | Freq: Every day | SUBCUTANEOUS | Status: DC

## 2015-06-30 MED ORDER — INSULIN ASPART 100 UNIT/ML FLEXPEN: PEN_INJECTOR | SUBCUTANEOUS | Status: DC

## 2015-06-30 MED ORDER — INSULIN GLARGINE 100 UNIT/ML SOLOSTAR PEN: PEN_INJECTOR | SUBCUTANEOUS | Status: DC

## 2015-06-30 MED ORDER — CARVEDILOL 25 MG PO TABS: 25.0000 mg | ORAL_TABLET | Freq: Two times a day (BID) | ORAL | Status: DC

## 2015-06-30 MED ORDER — ALBUTEROL SULFATE (2.5 MG/3ML) 0.083% IN NEBU: 2.5000 mg | INHALATION_SOLUTION | Freq: Four times a day (QID) | RESPIRATORY_TRACT | Status: DC | PRN

## 2015-06-30 MED ORDER — GABAPENTIN 400 MG PO CAPS: 400.0000 mg | ORAL_CAPSULE | Freq: Four times a day (QID) | ORAL | Status: DC

## 2015-06-30 MED ORDER — FUROSEMIDE 40 MG PO TABS: 60.0000 mg | ORAL_TABLET | Freq: Two times a day (BID) | ORAL | Status: DC

## 2015-06-30 MED ORDER — ACETAMINOPHEN-CODEINE #3 300-30 MG PO TABS: 1.0000 | ORAL_TABLET | Freq: Four times a day (QID) | ORAL | Status: DC | PRN

## 2015-06-30 MED ORDER — ATORVASTATIN CALCIUM 40 MG PO TABS: 40.0000 mg | ORAL_TABLET | Freq: Every day | ORAL | Status: DC

## 2015-06-30 MED ORDER — LEVOTHYROXINE SODIUM 50 MCG PO TABS: 50.0000 ug | ORAL_TABLET | Freq: Every day | ORAL | Status: DC

## 2015-06-30 MED ORDER — INSULIN PEN NEEDLE 32G X 4 MM MISC: 1.0000 | Freq: Three times a day (TID) | Status: DC

## 2015-06-30 MED ORDER — GLIPIZIDE ER 10 MG PO TB24: 20.0000 mg | ORAL_TABLET | Freq: Every day | ORAL | Status: DC

## 2015-06-30 MED ORDER — LISINOPRIL 20 MG PO TABS: 20.0000 mg | ORAL_TABLET | Freq: Every day | ORAL | Status: DC

## 2015-06-30 MED ORDER — INSULIN ASPART 100 UNIT/ML ~~LOC~~ SOLN: SUBCUTANEOUS | Status: DC

## 2015-06-30 MED ORDER — PANTOPRAZOLE SODIUM 40 MG PO TBEC: 40.0000 mg | DELAYED_RELEASE_TABLET | Freq: Every day | ORAL | Status: DC

## 2015-06-30 MED ORDER — ALBUTEROL SULFATE HFA 108 (90 BASE) MCG/ACT IN AERS: 2.0000 | INHALATION_SPRAY | Freq: Four times a day (QID) | RESPIRATORY_TRACT | Status: DC | PRN

## 2015-06-30 MED ORDER — CLOPIDOGREL BISULFATE 75 MG PO TABS: 75.0000 mg | ORAL_TABLET | Freq: Every day | ORAL | Status: DC

## 2015-06-30 NOTE — Progress Notes (Signed)
Patient here for first TCC visit She needs refills on all her medications  She has 10/10 pain in bilateral lower extremities that she describes a radiating nerve pain She states her Neurontin helps during the day but is not helping her pain when she tries to sleep

## 2015-06-30 NOTE — Progress Notes (Signed)
ASSESSMENT: Pt currently experiencing symptoms of anxiety and depression as a result of psychosocial circumstances. Pt needs to f/u with PCP, and would benefit from f/u with [facility], along with psychoeducation and supportive counseling regarding coping with symptoms of anxiety and depression.  Stage of Change: precontemplative  PLAN: 1. F/U with behavioral health consultant in two weeks 2. Psychiatric Medications: neurontin. 3. Behavioral recommendation(s):   -Consider reading educational material regarding coping with symptoms of anxiety and depression  SUBJECTIVE: Pt. referred by Dr Jarold Song for symptoms of anxiety and depression:  Pt. reports the following symptoms/concerns: Pt states that she used to like to walk, but that she cannot walk for very long now without hurting; says there is nothing else she can do to feel better. Pt says she only feels " a little bit" of anxiety and depression, and that it has to do with her living situation the past year. Duration of problem: one year Severity: mild  OBJECTIVE: Orientation & Cognition: Oriented x3. Thought processes normal and appropriate to situation. Mood: appropriate. Affect: appropriate Appearance: appropriate Risk of harm to self or others: no risk of harm to self or others Substance use: tobacco Assessments administered: PHQ9: 17/ GAD7: 13  Diagnosis: Problem related to psychosocial circumstances CPT Code: Z65.9 -------------------------------------------- Other(s) present in the room: none  Time spent with patient in exam room: 16 minutes

## 2015-06-30 NOTE — Patient Instructions (Signed)

## 2015-06-30 NOTE — Telephone Encounter (Signed)
Called patient and verified transportation, patient was advised that she needed to be ready for pick up at 11:30. Patient verbalized understanding.

## 2015-06-30 NOTE — Telephone Encounter (Signed)
Pharmacy called to request patient's insulin be changed to a flex pen instead of a vial and an order for insulin syringes

## 2015-07-01 ENCOUNTER — Encounter: Payer: Self-pay | Admitting: Physician Assistant

## 2015-07-01 NOTE — Progress Notes (Signed)
Cardiology Office Note Date:  07/02/2015  Patient ID:  [name] [birth year], MRN [id number] PCP:  Kevan Ny, MD  Cardiologist:  Dr. Gwenlyn Found   Chief Complaint: f/u hospitalization for AF  History of Present Illness: [name] is a 54 y.o. female with history of COPD, PVD s/p PTA as below, carotid disease (occluded RCA, 99991111 LICA s/p L carotid endarectomy 02/2015), DM A1c 9.4, paroxysmal atrial fibrillation, previous alcohol/narcotic/cocaine/marijuana abuse, hypothyroidism, chronic CHF, small vessel CAD by cath 10/2014, anxiety, HTN who presents for post-hospital follow-up. Per chart she previously self-d/c'd amio/Xarelto due to cost. She has prior history of noncompliance. Last echo 12/205: EF 40-45%, inferoapical/posterior HK, not technically sufficient to allow eval of LV diastolic function, normal LA in size. She had 2 admissions this month alone. 7/31-06/19/15 due to acute resp failure felt 2/2 acute on chronic systolic CHF. She was advised by IM at that time to strongly reconsider restarting Xarelto. She told them she had stopped it 2/2 nausea and vomiting. She was then readmitted 8/9 with worsening palpitations and chest discomfort. Troponin was minimally elevated to 0.11 felt 2/2 demand ischemia. She was found to be in rapid atrial fib and received diltiazem for rate stabilization. Given LV dysfunction she was transitioned to higher dose of BB. She converted to NSR. Aspirin was stopped and she was continued on Plavix/Xarelto. There were discussions to consider outpatient stress testing but it seems from the notes the team was not aware she had recently had cath (they were referencing last ischemic study as 2014). She was discharged home with plan for outpatient f/u. Last hgb 9.6 (prev 9-11 range), A1C 9.4, BUN/Cr 27/0.99, Na 133 TSH wnl.  She comes in to clinic today feeling better. No further chest pain. She does feel "drowsy" during the day sometimes. She thinks she snores. She also  reports chronic DOE. She does not exercise. She was supposed to go for cardiac rehab back in December but states there was an issue where they never called her back. No SOB at rest. She continues to smoke. Reports problems with insomnia. Denies alcohol or illicit drug use. She is now living in a motel. She reports compliance with meds. Denies any BRBPR, melena, hematemesis, or hematuria.     Past Medical History  Diagnosis Date  . Alcohol abuse   . Narcotic abuse   . Cocaine abuse   . Marijuana abuse   . Tobacco abuse   . Alcoholic cirrhosis   . Cardiomyopathy   . Obesity   . Chronic systolic CHF (congestive heart failure)     a. Last echo 12/205: EF 40-45%, inferoapical/posterior HK, not technically sufficient to allow eval of LV diastolic function, normal LA in size..  . Hypothyroidism   . GERD (gastroesophageal reflux disease)   . PAF (paroxysmal atrial fibrillation)   . Critical lower limb ischemia   . Hypertension   . CAD (coronary artery disease)     a. cath 11/10/2014 small vessel CAD. Demand ischemia in the setting of rapid a-fib  . Pneumonia     END MARCH 2016  . Anxiety   . Wrist fracture     LEFT; "just did xray and cast"  . Poorly controlled type 2 diabetes mellitus   . Hyperlipemia   . Peripheral arterial disease     a. 01/2015 Angio/PTA: LSFA 100 w/ recon @ adductor canal and 1 vessel runoff via AT, RSFA 99 (atherectomy/pta) - 1 vessel runoff via diff dzs peroneal.  . Carotid artery  disease     a. 01/2015 Carotid Angio: RICA 123XX123, LICA 99991111. s/p L carotid endarterectomy 02/2015.  [name] COPD (chronic obstructive pulmonary disease)   . Arthritis     "hands, arms, shoulders, legs, back" (04/29/2015)  . Diabetic peripheral neuropathy     Past Surgical History  Procedure Laterality Date  . Cardioversion  ~ 02/2013    "twice"   . Lower extremity angiogram N/A 09/10/2013    Procedure: LOWER EXTREMITY ANGIOGRAM;  Surgeon: [name], MD;  Location: [facility] CATH LAB;  Service:  Cardiovascular;  Laterality: N/A;  . Left heart catheterization with coronary angiogram N/A 10/31/2014    Procedure: LEFT HEART CATHETERIZATION WITH CORONARY ANGIOGRAM;  Surgeon: Burnell Blanks, MD;  Location: [facility] CATH LAB;  Service: Cardiovascular;  Laterality: N/A;  . Peripheral athrectomy Right 01/15/2015    SFA/notes 01/15/2015  . Balloon angioplasty, artery Right 01/15/2015    SFA/notes 01/15/2015  . Cardiac catheterization    . Lower extremity angiogram N/A 01/15/2015    Procedure: LOWER EXTREMITY ANGIOGRAM;  Surgeon: [name], MD;  Location: [facility] CATH LAB;  Service: Cardiovascular;  Laterality: N/A;  . Carotid angiogram N/A 01/15/2015    Procedure: CAROTID ANGIOGRAM;  Surgeon: [name], MD;  Location: [facility] CATH LAB;  Service: Cardiovascular;  Laterality: N/A;  . Endarterectomy Left 02/19/2015    Procedure: LEFT CAROTID ENDARTERECTOMY ;  Surgeon: Serafina Mitchell, MD;  Location: [facility] OR;  Service: Vascular;  Laterality: Left;  . Dilation and curettage of uterus  1988    Current Outpatient Prescriptions  Medication Sig Dispense Refill  . acetaminophen-codeine (TYLENOL #3) 300-30 MG per tablet Take 1 tablet by mouth every 6 (six) hours as needed for moderate pain. 40 tablet 1  . albuterol (PROVENTIL HFA;VENTOLIN HFA) 108 (90 BASE) MCG/ACT inhaler Inhale 2 puffs into the lungs every 6 (six) hours as needed for wheezing or shortness of breath. 1 each 3  . albuterol (PROVENTIL) (2.5 MG/3ML) 0.083% nebulizer solution Take 3 mLs (2.5 mg total) by nebulization every 6 (six) hours as needed. For wheezing. 75 mL 2  . atorvastatin (LIPITOR) 40 MG tablet Take 1 tablet (40 mg total) by mouth daily. 30 tablet 2  . Blood Glucose Monitoring Suppl (TRUE METRIX METER) DEVI 1 Device by Does not apply route 3 (three) times daily before meals. 1 Device 0  . carvedilol (COREG) 25 MG tablet Take 1 tablet (25 mg total) by mouth 2 (two) times daily with a meal. 60 tablet 0  . clopidogrel (PLAVIX) 75 MG  tablet Take 1 tablet (75 mg total) by mouth daily with breakfast. 30 tablet 6  . furosemide (LASIX) 40 MG tablet Take 1.5 tablets (60 mg total) by mouth 2 (two) times daily. 60 tablet 0  . gabapentin (NEURONTIN) 400 MG capsule Take 1 capsule (400 mg total) by mouth 4 (four) times daily. 60 capsule 0  . glipiZIDE (GLUCOTROL XL) 10 MG 24 hr tablet Take 2 tablets (20 mg total) by mouth daily with breakfast. 60 tablet 3  . glucose blood (TRUE METRIX BLOOD GLUCOSE TEST) test strip Use as instructed 100 each 12  . insulin aspart (NOVOLOG) 100 UNIT/ML FlexPen Sliding scale 15 mL 11  . Insulin Glargine (LANTUS) 100 UNIT/ML Solostar Pen Inject 45 units subcutaneous hs 15 mL 11  . Insulin Pen Needle (ULTICARE MICRO PEN NEEDLES) 32G X 4 MM MISC 1 Syringe by Does not apply route 4 (four) times daily - after meals and at bedtime. 1 each 11  .  levothyroxine (SYNTHROID, LEVOTHROID) 50 MCG tablet Take 1 tablet (50 mcg total) by mouth daily. 30 tablet 2  . lisinopril (PRINIVIL,ZESTRIL) 20 MG tablet Take 1 tablet (20 mg total) by mouth daily. 30 tablet 2  . nitroGLYCERIN (NITROSTAT) 0.4 MG SL tablet Place 1 tablet (0.4 mg total) under the tongue every 5 (five) minutes as needed for chest pain. 25 tablet 3  . pantoprazole (PROTONIX) 40 MG tablet Take 1 tablet (40 mg total) by mouth daily. 30 tablet 2  . rivaroxaban (XARELTO) 20 MG TABS tablet Take 1 tablet (20 mg total) by mouth daily with supper. 30 tablet 2  . TRUEPLUS LANCETS 28G MISC 1 Device by Does not apply route 3 (three) times daily before meals. 1 each 11   No current facility-administered medications for this visit.    Allergies:   Amiodarone   Social History:  The patient  reports that she has been smoking Cigarettes.  She has a 20 pack-year smoking history. She has never used smokeless tobacco. She reports that she drinks alcohol. She reports that she does not use illicit drugs.   Family History:  The patient's family history includes Cancer in her  mother; Clotting disorder in her mother; Diabetes in her mother; Emphysema in her sister; Heart attack in her mother; Heart disease in her father and mother; Hypertension in her father and mother.  ROS:  Please see the history of present illness.    All other systems are reviewed and otherwise negative.   PHYSICAL EXAM:  VS:  BP 110/56 mmHg  Pulse 69  Ht 5\' 4"  (1.626 m)  Wt 226 lb 6.4 oz (102.694 kg)  BMI 38.84 kg/m2  LMP 11/27/2012 BMI: Body mass index is 38.84 kg/(m^2). Well nourished, well developed WF, in no acute distress HEENT: normocephalic, atraumatic Neck: no JVD or masses Cardiac:  normal S1, S2; RRR; no murmurs, rubs, or gallops Lungs:  clear to auscultation bilaterally, no wheezing, rhonchi or rales Abd: soft, nontender, no hepatomegaly, + BS MS: no deformity or atrophy Ext: no edema Skin: warm and dry, no rash Neuro:  moves all extremities spontaneously, no focal abnormalities noted, follows commands Psych: euthymic mood, full affect   EKG:  Done today shows NSR 69bpm, inferolateral TWI. No sig change from prior (except NSR), QTc 471ms  Recent Labs: 10/29/2014: Pro B Natriuretic peptide (BNP) 1070.0* 06/23/2015: ALT 30; B Natriuretic Peptide 539.4*; TSH 1.647 06/24/2015: BUN 27*; Creatinine, Ser 0.99; Hemoglobin 9.6*; Magnesium 1.6*; Platelets 275; Potassium 3.9; Sodium 133*  03/31/2015: Cholesterol 208*; HDL 43*; LDL Cholesterol 96; Total CHOL/HDL Ratio 4.8; Triglycerides 347*; VLDL 69*   Estimated Creatinine Clearance: 77.5 mL/min (by C-G formula based on Cr of 0.99).   Wt Readings from Last 3 Encounters:  07/02/15 226 lb 6.4 oz (102.694 kg)  06/30/15 211 lb (95.709 kg)  06/25/15 224 lb 1.6 oz (101.651 kg)     Other studies reviewed: Additional studies/records reviewed today include: summarized above  ASSESSMENT AND PLAN:  1. Paroxysmal atrial fibrillation - recently converted on her own. Maintaining NSR on higher dose BB. She reports daytime drowsiness and  snoring. Will proceed with sleep study. Recent TSH wnl. Continue Xarelto. Discussed monitoring for bleeding while on concomitant Xarelto/Plavix. 2. Chronic systolic CHF - weight generally stable since admission. I do not think the weight of 211 on 06/30/15 was accurate as it varies considerably from all prior weights this year. D/C weight when diuresed earlier this month was 225-227. She appears euvolemic. Continue BB, ACEI, Lasix.  3. PVD s/p LE PTA 01/2015, carotid endarterectomy 02/2015 - she is maintained on Plavix. 4. Anemia - as above, discussed observation for bleeding. I asked her to f/u PCP for further evaluation of this. 5. Essential HTN - controlled. 6. CAD (diffuse small vessel disease 10/2014) - in December she bumped her troponin in the setting of rapid AF + small vessel disease, and did the same again this recent admission. She has not had any further chest pain in the absence of atrial fibrillation. We will observe for further symptoms. We discussed observation for recurrence of chest pain/pressure - she will notify us if she begins to develop this. ASA recently stopped as above.  I believe her chronic DOE is a combination of deconditioning and continued tobacco abuse. She is interested in giving cardiac rehab a shot. We will refer.  Disposition: F/u with Dr. Gwenlyn Found in 3 months.  Current medicines are reviewed at length with the patient today.  The patient did not have any concerns regarding medicines. Note that I did not charge a TOC visit for today as the PCP note from 06/30/15 indicates that was a transitional care visit.  Raechel Ache PA-C 07/02/2015 8:15 AM     [facility] [address] [place] [address] [phone] (office)  [phone] (fax)

## 2015-07-02 ENCOUNTER — Encounter: Payer: Self-pay | Admitting: Physician Assistant

## 2015-07-02 ENCOUNTER — Ambulatory Visit (INDEPENDENT_AMBULATORY_CARE_PROVIDER_SITE_OTHER): Payer: Medicaid Other | Admitting: Physician Assistant

## 2015-07-02 VITALS — BP 110/56 | HR 69 | Ht 64.0 in | Wt 226.4 lb

## 2015-07-02 DIAGNOSIS — I5022 Chronic systolic (congestive) heart failure: Secondary | ICD-10-CM | POA: Diagnosis not present

## 2015-07-02 DIAGNOSIS — I2510 Atherosclerotic heart disease of native coronary artery without angina pectoris: Secondary | ICD-10-CM

## 2015-07-02 DIAGNOSIS — D649 Anemia, unspecified: Secondary | ICD-10-CM | POA: Diagnosis not present

## 2015-07-02 DIAGNOSIS — I739 Peripheral vascular disease, unspecified: Secondary | ICD-10-CM | POA: Diagnosis not present

## 2015-07-02 DIAGNOSIS — I480 Paroxysmal atrial fibrillation: Secondary | ICD-10-CM | POA: Diagnosis not present

## 2015-07-02 DIAGNOSIS — I10 Essential (primary) hypertension: Secondary | ICD-10-CM

## 2015-07-02 NOTE — Patient Instructions (Signed)
Medication Instructions:  Your physician recommends that you continue on your current medications as directed. Please refer to the Current Medication list given to you today.   Labwork: None   Testing/Procedures: Your physician has recommended that you have a sleep study. This test records several body functions during sleep, including: brain activity, eye movement, oxygen and carbon dioxide blood levels, heart rate and rhythm, breathing rate and rhythm, the flow of air through your mouth and nose, snoring, body muscle movements, and chest and belly movement.   Follow-Up: Your physician recommends that you schedule a follow-up appointment in: 3 months with Dr.Berry  You have been referred to [facility]  Schedule an appointment with your primary care physician to address your anemia    Any Other Special Instructions Will Be Listed Below (If Applicable). Your physician discussed the hazards of tobacco use. Tobacco use cessation is recommended.

## 2015-07-02 NOTE — Progress Notes (Signed)
[place]  PCP: none (Previously Dr [name])  Date of Telephone encounter:  06/26/15  Admit Date : 06/22/15 Discharge Date : 06/25/15  [name], is a 54 y.o. female  [id number]  [id number]  DOB - [birth year]  CC:  Chief Complaint  Patient presents with  . Hospitalization Follow-up       HPI: [name] is a 54 y.o. female 54 year old female with a history of Uncontrolled Type 2 DM, HTN, A.fib, Hypothyroidism, CHF, CAD, PVD, carotid stenosis s/p L endarterectomy, COPD recently Hospitalized for Afib with RVR.  She had presented to the ED with worsening palpitations and chest discomfort. She was also complaining of headache as well. In the ER she was found to be in Afib with RVR; cardiology was consulted ; Diltiazem was utilized and successfully helped with rate control. She was initially given adenosine because of the rapid ventricular rate which was unsuccessful.  Troponins were elevated at 0.05, 0.06, 0.11 which was thought to be secondary to demand ischemia. She had a 2d echo from 10/2014 which revealed an EF of 40-45%. Benadryl and Reglan was given for her headaches.  She did have a UTI which was treated with IV Rocephin. For DM she was maintained on Lantus and Novolog. Her condition improved and she was subsequently discharged with an increased dose of Coreg and recommended follow up with Cardiology.    Interval History: She complains of her knees giving out on her and hurting; requests a rolling walker to assist with ambulation especially when she has to catch the bus. Patient has No headache, No chest pain, No abdominal pain - No Nausea, No Cough - SOB.  Allergies  Allergen Reactions  . Amiodarone Nausea And Vomiting   Past Medical History  Diagnosis Date  . Alcohol abuse   . Narcotic abuse   . Cocaine abuse   . Marijuana abuse   . Tobacco abuse   . Alcoholic cirrhosis   . Cardiomyopathy   . Obesity   . Chronic systolic CHF (congestive  heart failure)     a. Last echo 12/205: EF 40-45%, inferoapical/posterior HK, not technically sufficient to allow eval of LV diastolic function, normal LA in size..  . Hypothyroidism   . GERD (gastroesophageal reflux disease)   . PAF (paroxysmal atrial fibrillation)   . Critical lower limb ischemia   . Hypertension   . CAD (coronary artery disease)     a. cath 11/10/2014 small vessel CAD. Demand ischemia in the setting of rapid a-fib  . Pneumonia     END MARCH 2016  . Anxiety   . Wrist fracture     LEFT; "just did xray and cast"  . Poorly controlled type 2 diabetes mellitus   . Hyperlipemia   . Peripheral arterial disease     a. 01/2015 Angio/PTA: LSFA 100 w/ recon @ adductor canal and 1 vessel runoff via AT, RSFA 99 (atherectomy/pta) - 1 vessel runoff via diff dzs peroneal.  . Carotid artery disease     a. 01/2015 Carotid Angio: RICA 123XX123, LICA [id number]. s/p L carotid endarterectomy 02/2015.  [name] COPD (chronic obstructive pulmonary disease)   . Arthritis     "hands, arms, shoulders, legs, back" (04/29/2015)  . Diabetic peripheral neuropathy    Current Outpatient Prescriptions on File Prior to Visit  Medication Sig Dispense Refill  . Blood Glucose Monitoring Suppl (TRUE METRIX METER) DEVI 1 Device by Does not apply route 3 (three) times daily before meals. 1 Device 0  .  glucose blood (TRUE METRIX BLOOD GLUCOSE TEST) test strip Use as instructed 100 each 12  . TRUEPLUS LANCETS 28G MISC 1 Device by Does not apply route 3 (three) times daily before meals. 1 each 11  . nitroGLYCERIN (NITROSTAT) 0.4 MG SL tablet Place 1 tablet (0.4 mg total) under the tongue every 5 (five) minutes as needed for chest pain. 25 tablet 3   No current facility-administered medications on file prior to visit.   Family History  Problem Relation Age of Onset  . Hypertension Mother   . Diabetes Mother   . Cancer Mother     breast, ovarian, colon  . Clotting disorder Mother   . Heart disease Mother   . Heart attack  Mother   . Hypertension Father   . Heart disease Father   . Emphysema Sister     smoked   Social History   Social History  . Marital Status: Divorced    Spouse Name: N/A  . Number of Children: N/A  . Years of Education: N/A   Occupational History  . disabled    Social History Main Topics  . Smoking status: Current Every Day Smoker -- 0.50 packs/day for 40 years    Types: Cigarettes    Last Attempt to Quit: 02/19/2015  . Smokeless tobacco: Never Used  . Alcohol Use: 0.0 oz/week    0 Standard drinks or equivalent per week     Comment: 04/29/2015 "might have a mixed drink maybe once/month, or less"  . Drug Use: No     Comment: 04/29/2015 "last drug use was ~ 09/08/2013"  . Sexual Activity: No   Other Topics Concern  . Not on file   Social History Narrative    Review of Systems: Constitutional: Negative for fever, chills, diaphoresis, activity change, appetite change and fatigue. HENT: Negative for ear pain, nosebleeds, congestion, facial swelling, rhinorrhea, neck pain, neck stiffness and ear discharge.  Eyes: Negative for pain, discharge, redness, itching and visual disturbance. Respiratory: Negative for cough, choking, chest tightness, shortness of breath, wheezing and stridor.  Cardiovascular: Negative for chest pain, palpitations and leg swelling. Gastrointestinal: Negative for abdominal distention. Genitourinary: Negative for dysuria, urgency, frequency, hematuria, flank pain, decreased urine volume, difficulty urinating and dyspareunia.  Musculoskeletal: see hpi Neurological: Negative for dizziness, tremors, seizures, syncope, facial asymmetry, speech difficulty, weakness, light-headedness, + numbness and no headaches.  Hematological: Negative for adenopathy. Does not bruise/bleed easily. Skin: Negative for rash, ulcer. Psychiatric/Behavioral: Negative for hallucinations, behavioral problems, confusion, dysphoric mood, decreased concentration and agitation.     Objective:   Filed Vitals:   06/30/15 1203  BP: 133/81  Pulse: 88  Temp: 98.6 F (37 C)    Physical Exam: Constitutional: Patient appears well-developed and well-nourished. No distress. HENT: Normocephalic, atraumatic, External right and left ear normal. Oropharynx is clear and moist.  Eyes: Conjunctivae and EOM are normal. PERRLA, no scleral icterus. Neck: Normal ROM, No JVD. No tracheal deviation. No thyromegaly. CVS: RRR, S1/S2 +, no murmurs, no gallops, no carotid bruit, unable to palpate dorsalis pedis bilaterally  Pulmonary: Effort and breath sounds normal, no stridor, rhonchi, wheezes, rales.  Abdominal: Soft. BS +, no distension, tenderness, rebound or guarding.  Musculoskeletal: mild tenderness on ROM of knees Lymphadenopathy: No lymphadenopathy noted, cervical, inguinal or axillary Neuro: Alert. Normal reflexes, muscle tone coordination. No cranial nerve deficit. Skin: Skin is warm and dry. No rash noted. Not diaphoretic. No erythema. No pallor. Psychiatric: Normal mood and affect. Behavior, judgment, thought content normal.  Lab Results  Component Value Date   WBC 8.5 06/24/2015   HGB 9.6* 06/24/2015   HCT 30.1* 06/24/2015   MCV 88.5 06/24/2015   PLT 275 06/24/2015   Lab Results  Component Value Date   CREATININE 0.99 06/24/2015   BUN 27* 06/24/2015   NA 133* 06/24/2015   K 3.9 06/24/2015   CL 96* 06/24/2015   CO2 27 06/24/2015    Lab Results  Component Value Date   HGBA1C 9.4* 06/24/2015   Lipid Panel     Component Value Date/Time   CHOL 208* 03/31/2015 0802   TRIG 347* 03/31/2015 0802   HDL 43* 03/31/2015 0802   CHOLHDL 4.8 03/31/2015 0802   VLDL 69* 03/31/2015 0802   LDLCALC 96 03/31/2015 0802       Assessment and plan:  54 year old female with a history of Uncontrolled Type 2 DM, HTN, A.fib, PVD, carotid stenosis s/p L endarterectomy, Hypothyroidism, CHF, CAD, COPD recently Hospitalized for Afib with RVR.  A.fib with RVR: [name] 2  score of 4                    Currently on anticoagulation with Xarelto. Rate control with Coreg. Scheduled to see Cardiology tomorrow.  CHF: EF 40-45% Continue daily weight checks, limit fluid intake to less than 2L/day Heart healthy low sodium diet  Uncontrolled Type 2DM: A1c 9.4, CBG 283 Increase Lantus to 45 units  Refilled Novolog  HTN: Controlled Continue antihypertensives  CAD: Aggressive risk factor modification. Remains on Plavix and Statin  Knee Pain: Likely secondary to underlying osteoarthritis. Placed on Tylenol #3 Prescription for rolling walker given.  Hypothyroidism: Last TSH was normal-1.647 Continue Synthroid.     The patient was given clear instructions to go to ER or return to medical center if symptoms don't improve, worsen or new problems develop. The patient verbalized understanding. The patient was told to call to get lab results if they haven't heard anything in the next week.     Arnoldo Morale, [place] [facility] [phone] 07/02/2015, 8:18 AM

## 2015-07-04 ENCOUNTER — Emergency Department (HOSPITAL_COMMUNITY): Payer: Medicaid Other

## 2015-07-04 ENCOUNTER — Encounter (HOSPITAL_COMMUNITY): Payer: Self-pay | Admitting: Emergency Medicine

## 2015-07-04 ENCOUNTER — Emergency Department (HOSPITAL_COMMUNITY)
Admission: EM | Admit: 2015-07-04 | Discharge: 2015-07-04 | Disposition: A | Discharge status: Home / Self Care | Payer: Medicaid Other | Attending: Emergency Medicine | Admitting: Emergency Medicine

## 2015-07-04 DIAGNOSIS — I2510 Atherosclerotic heart disease of native coronary artery without angina pectoris: Secondary | ICD-10-CM | POA: Insufficient documentation

## 2015-07-04 DIAGNOSIS — Z8701 Personal history of pneumonia (recurrent): Secondary | ICD-10-CM | POA: Insufficient documentation

## 2015-07-04 DIAGNOSIS — M1990 Unspecified osteoarthritis, unspecified site: Secondary | ICD-10-CM | POA: Insufficient documentation

## 2015-07-04 DIAGNOSIS — K219 Gastro-esophageal reflux disease without esophagitis: Secondary | ICD-10-CM | POA: Diagnosis not present

## 2015-07-04 DIAGNOSIS — Z794 Long term (current) use of insulin: Secondary | ICD-10-CM | POA: Insufficient documentation

## 2015-07-04 DIAGNOSIS — E1165 Type 2 diabetes mellitus with hyperglycemia: Secondary | ICD-10-CM | POA: Insufficient documentation

## 2015-07-04 DIAGNOSIS — I10 Essential (primary) hypertension: Secondary | ICD-10-CM | POA: Diagnosis not present

## 2015-07-04 DIAGNOSIS — Z3202 Encounter for pregnancy test, result negative: Secondary | ICD-10-CM | POA: Diagnosis not present

## 2015-07-04 DIAGNOSIS — R509 Fever, unspecified: Secondary | ICD-10-CM

## 2015-07-04 DIAGNOSIS — E669 Obesity, unspecified: Secondary | ICD-10-CM | POA: Insufficient documentation

## 2015-07-04 DIAGNOSIS — Z8781 Personal history of (healed) traumatic fracture: Secondary | ICD-10-CM | POA: Diagnosis not present

## 2015-07-04 DIAGNOSIS — E039 Hypothyroidism, unspecified: Secondary | ICD-10-CM | POA: Insufficient documentation

## 2015-07-04 DIAGNOSIS — Z79899 Other long term (current) drug therapy: Secondary | ICD-10-CM | POA: Diagnosis not present

## 2015-07-04 DIAGNOSIS — J449 Chronic obstructive pulmonary disease, unspecified: Secondary | ICD-10-CM | POA: Insufficient documentation

## 2015-07-04 DIAGNOSIS — R531 Weakness: Secondary | ICD-10-CM

## 2015-07-04 DIAGNOSIS — J189 Pneumonia, unspecified organism: Secondary | ICD-10-CM

## 2015-07-04 DIAGNOSIS — Z720 Tobacco use: Secondary | ICD-10-CM | POA: Diagnosis not present

## 2015-07-04 DIAGNOSIS — R739 Hyperglycemia, unspecified: Secondary | ICD-10-CM

## 2015-07-04 DIAGNOSIS — I5022 Chronic systolic (congestive) heart failure: Secondary | ICD-10-CM | POA: Diagnosis not present

## 2015-07-04 LAB — I-STAT BETA HCG BLOOD, ED (MC, WL, AP ONLY): I-stat hCG, quantitative: <5 m[IU]/mL (ref <5)

## 2015-07-04 LAB — URINALYSIS, ROUTINE W REFLEX MICROSCOPIC
BILIRUBIN URINE: NEGATIVE
GLUCOSE, UA: 500 mg/dL — AB
KETONES UR: NEGATIVE mg/dL
Leukocytes, UA: NEGATIVE
Nitrite: NEGATIVE
PH: 5.5 (ref 5.0–8.0)
PROTEIN: 100 mg/dL — AB
Specific Gravity, Urine: 1.014 (ref 1.005–1.030)
Urobilinogen, UA: 0.2 mg/dL (ref 0.0–1.0)

## 2015-07-04 LAB — COMPREHENSIVE METABOLIC PANEL
ALT: 35 U/L (ref 14–54)
AST: 42 U/L — AB (ref 15–41)
Albumin: 2.9 g/dL — ABNORMAL LOW (ref 3.5–5.0)
Alkaline Phosphatase: 312 U/L — ABNORMAL HIGH (ref 38–126)
Anion gap: 11 (ref 5–15)
BILIRUBIN TOTAL: 0.2 mg/dL — AB (ref 0.3–1.2)
BUN: 33 mg/dL — AB (ref 6–20)
CO2: 28 mmol/L (ref 22–32)
CREATININE: 1.15 mg/dL — AB (ref 0.44–1.00)
Calcium: 8.9 mg/dL (ref 8.9–10.3)
Chloride: 94 mmol/L — ABNORMAL LOW (ref 101–111)
GFR, EST NON AFRICAN AMERICAN: 54 mL/min — AB (ref >60)
Glucose, Bld: 353 mg/dL — ABNORMAL HIGH (ref 65–99)
POTASSIUM: 4.7 mmol/L (ref 3.5–5.1)
Sodium: 133 mmol/L — ABNORMAL LOW (ref 135–145)
TOTAL PROTEIN: 6.3 g/dL — AB (ref 6.5–8.1)

## 2015-07-04 LAB — CBC WITH DIFFERENTIAL/PLATELET
BASOS ABS: 0 10*3/uL (ref 0.0–0.1)
Basophils Relative: 0 % (ref 0–1)
EOS PCT: 1 % (ref 0–5)
Eosinophils Absolute: 0.2 10*3/uL (ref 0.0–0.7)
HEMATOCRIT: 28.1 % — AB (ref 36.0–46.0)
Hemoglobin: 9.2 g/dL — ABNORMAL LOW (ref 12.0–15.0)
LYMPHS ABS: 1.4 10*3/uL (ref 0.7–4.0)
LYMPHS PCT: 9 % — AB (ref 12–46)
MCH: 28.1 pg (ref 26.0–34.0)
MCHC: 32.7 g/dL (ref 30.0–36.0)
MCV: 85.9 fL (ref 78.0–100.0)
MONO ABS: 1.1 10*3/uL — AB (ref 0.1–1.0)
MONOS PCT: 7 % (ref 3–12)
NEUTROS ABS: 13.6 10*3/uL — AB (ref 1.7–7.7)
Neutrophils Relative %: 83 % — ABNORMAL HIGH (ref 43–77)
PLATELETS: 316 10*3/uL (ref 150–400)
RBC: 3.27 MIL/uL — ABNORMAL LOW (ref 3.87–5.11)
RDW: 14 % (ref 11.5–15.5)
WBC: 16.3 10*3/uL — ABNORMAL HIGH (ref 4.0–10.5)

## 2015-07-04 LAB — CBG MONITORING, ED
GLUCOSE-CAPILLARY: 337 mg/dL — AB (ref 65–99)
Glucose-Capillary: 251 mg/dL — ABNORMAL HIGH (ref 65–99)

## 2015-07-04 LAB — URINE MICROSCOPIC-ADD ON

## 2015-07-04 LAB — LIPASE, BLOOD: LIPASE: 24 U/L (ref 22–51)

## 2015-07-04 LAB — I-STAT CG4 LACTIC ACID, ED: LACTIC ACID, VENOUS: 1.43 mmol/L (ref 0.5–2.0)

## 2015-07-04 MED ORDER — ACETAMINOPHEN 500 MG PO TABS
1000.0000 mg | ORAL_TABLET | Freq: Once | ORAL | Status: AC
  Administered 2015-07-04: 1000 mg via ORAL
  Filled 2015-07-04: qty 2

## 2015-07-04 MED ORDER — IOHEXOL 300 MG/ML  SOLN
100.0000 mL | Freq: Once | INTRAMUSCULAR | Status: AC | PRN
  Administered 2015-07-04: 100 mL via INTRAVENOUS

## 2015-07-04 MED ORDER — SODIUM CHLORIDE 0.9 % IV BOLUS (SEPSIS)
1000.0000 mL | Freq: Once | INTRAVENOUS | Status: AC
  Administered 2015-07-04: 1000 mL via INTRAVENOUS

## 2015-07-04 MED ORDER — IOHEXOL 300 MG/ML  SOLN
25.0000 mL | Freq: Once | INTRAMUSCULAR | Status: AC | PRN
  Administered 2015-07-04: 25 mL via ORAL

## 2015-07-04 NOTE — ED Notes (Signed)
[facility] pt coming from home vomiting this morning, weak, fever. Pt started feeling bad around lunch. Pt reports 2x in last 24 hrs, yellow and green to color. Pt able to keep food down. PMH COPD and CHF. 138/63 BP, 89HR, 18R,93% on RA with EMS (EMS put on 99% on 2L)  20 L hand.

## 2015-07-04 NOTE — ED Provider Notes (Signed)
CSN: [id number]     Arrival date & time 07/04/15  0417 History   First MD Initiated Contact with Patient 07/04/15 [phone]     Chief Complaint  Patient presents with  . Fever     (Consider location/radiation/quality/duration/timing/severity/associated sxs/prior Treatment) HPI  [name] is a 54 y.o. female with past history of polysubstance abuse, alcoholic cirrhosis, CHF, coronary artery disease, hypertension, hyperlipidemia, peripheral artery disease presenting today with weakness. She states she has felt weak for the past several days. She feels like she does not want to do anything. She has had fevers and chills as well. She denies any abdominal pain or diarrhea. She had an episode of emesis today which self resolved. She has no viral URI like symptoms. She denies any changes in her urine. Patient has no further complaints.   10 Systems reviewed and are negative for acute change except as noted in the HPI.    Past Medical History  Diagnosis Date  . Alcohol abuse   . Narcotic abuse   . Cocaine abuse   . Marijuana abuse   . Tobacco abuse   . Alcoholic cirrhosis   . Cardiomyopathy   . Obesity   . Chronic systolic CHF (congestive heart failure)     a. Last echo 12/205: EF 40-45%, inferoapical/posterior HK, not technically sufficient to allow eval of LV diastolic function, normal LA in size..  . Hypothyroidism   . GERD (gastroesophageal reflux disease)   . PAF (paroxysmal atrial fibrillation)   . Critical lower limb ischemia   . Hypertension   . CAD (coronary artery disease)     a. cath 11/10/2014 small vessel CAD. Demand ischemia in the setting of rapid a-fib  . Pneumonia     END MARCH 2016  . Anxiety   . Wrist fracture     LEFT; "just did xray and cast"  . Poorly controlled type 2 diabetes mellitus   . Hyperlipemia   . Peripheral arterial disease     a. 01/2015 Angio/PTA: LSFA 100 w/ recon @ adductor canal and 1 vessel runoff via AT, RSFA 99 (atherectomy/pta) - 1 vessel  runoff via diff dzs peroneal.  . Carotid artery disease     a. 01/2015 Carotid Angio: RICA [id number], LICA [id number]. s/p L carotid endarterectomy 02/2015.  [name] COPD (chronic obstructive pulmonary disease)   . Arthritis     "hands, arms, shoulders, legs, back" (04/29/2015)  . Diabetic peripheral neuropathy    Past Surgical History  Procedure Laterality Date  . Cardioversion  ~ 02/2013    "twice"   . Lower extremity angiogram N/A 09/10/2013    Procedure: LOWER EXTREMITY ANGIOGRAM;  Surgeon: [name], MD;  Location: [facility] CATH LAB;  Service: Cardiovascular;  Laterality: N/A;  . Left heart catheterization with coronary angiogram N/A 10/31/2014    Procedure: LEFT HEART CATHETERIZATION WITH CORONARY ANGIOGRAM;  Surgeon: Burnell Blanks, MD;  Location: [facility] CATH LAB;  Service: Cardiovascular;  Laterality: N/A;  . Peripheral athrectomy Right 01/15/2015    SFA/notes 01/15/2015  . Balloon angioplasty, artery Right 01/15/2015    SFA/notes 01/15/2015  . Cardiac catheterization    . Lower extremity angiogram N/A 01/15/2015    Procedure: LOWER EXTREMITY ANGIOGRAM;  Surgeon: [name], MD;  Location: [facility] CATH LAB;  Service: Cardiovascular;  Laterality: N/A;  . Carotid angiogram N/A 01/15/2015    Procedure: CAROTID ANGIOGRAM;  Surgeon: [name], MD;  Location: [facility] CATH LAB;  Service: Cardiovascular;  Laterality: N/A;  . Endarterectomy Left  02/19/2015    Procedure: LEFT CAROTID ENDARTERECTOMY ;  Surgeon: Serafina Mitchell, MD;  Location: [facility] OR;  Service: Vascular;  Laterality: Left;  . Dilation and curettage of uterus  1988   Family History  Problem Relation Age of Onset  . Hypertension Mother   . Diabetes Mother   . Cancer Mother     breast, ovarian, colon  . Clotting disorder Mother   . Heart disease Mother   . Heart attack Mother   . Hypertension Father   . Heart disease Father   . Emphysema Sister     smoked   Social History  Substance Use Topics  . Smoking status: Current Every Day Smoker --  0.50 packs/day for 40 years    Types: Cigarettes    Last Attempt to Quit: 02/19/2015  . Smokeless tobacco: Never Used  . Alcohol Use: No   OB History    Gravida Para Term Preterm AB TAB SAB Ectopic Multiple Living   1 1 1       1      Review of Systems    Allergies  Amiodarone  Home Medications   Prior to Admission medications   Medication Sig Start Date End Date Taking? Authorizing Provider  acetaminophen-codeine (TYLENOL #3) 300-30 MG per tablet Take 1 tablet by mouth every 6 (six) hours as needed for moderate pain. 06/30/15  Yes Arnoldo Morale, MD  albuterol (PROVENTIL HFA;VENTOLIN HFA) 108 (90 BASE) MCG/ACT inhaler Inhale 2 puffs into the lungs every 6 (six) hours as needed for wheezing or shortness of breath. 06/30/15  Yes Arnoldo Morale, MD  albuterol (PROVENTIL) (2.5 MG/3ML) 0.083% nebulizer solution Take 3 mLs (2.5 mg total) by nebulization every 6 (six) hours as needed. For wheezing. 06/30/15  Yes Arnoldo Morale, MD  atorvastatin (LIPITOR) 40 MG tablet Take 1 tablet (40 mg total) by mouth daily. 06/30/15  Yes Arnoldo Morale, MD  Blood Glucose Monitoring Suppl (TRUE METRIX METER) DEVI 1 Device by Does not apply route 3 (three) times daily before meals. 06/26/15  Yes Arnoldo Morale, MD  carvedilol (COREG) 25 MG tablet Take 1 tablet (25 mg total) by mouth 2 (two) times daily with a meal. 06/30/15  Yes Arnoldo Morale, MD  Cholecalciferol (VITAMIN D PO) Take 1 capsule by mouth daily.   Yes Historical Provider, MD  clopidogrel (PLAVIX) 75 MG tablet Take 1 tablet (75 mg total) by mouth daily with breakfast. 06/30/15  Yes Arnoldo Morale, MD  furosemide (LASIX) 40 MG tablet Take 1.5 tablets (60 mg total) by mouth 2 (two) times daily. 06/30/15  Yes Arnoldo Morale, MD  gabapentin (NEURONTIN) 400 MG capsule Take 1 capsule (400 mg total) by mouth 4 (four) times daily. 06/30/15  Yes Arnoldo Morale, MD  glipiZIDE (GLUCOTROL XL) 10 MG 24 hr tablet Take 2 tablets (20 mg total) by mouth daily with breakfast. 06/30/15  Yes  Arnoldo Morale, MD  glucose blood (TRUE METRIX BLOOD GLUCOSE TEST) test strip Use as instructed 06/26/15  Yes Arnoldo Morale, MD  insulin aspart (NOVOLOG) 100 UNIT/ML FlexPen Sliding scale Patient taking differently: Inject 2-8 Units into the skin 3 (three) times daily with meals. Sliding scale 06/30/15  Yes Arnoldo Morale, MD  Insulin Glargine (LANTUS) 100 UNIT/ML Solostar Pen Inject 45 units subcutaneous hs Patient taking differently: Inject 45 Units into the skin at bedtime. Inject 45 units subcutaneous hs 06/30/15  Yes Arnoldo Morale, MD  Insulin Pen Needle (ULTICARE MICRO PEN NEEDLES) 32G X 4 MM MISC 1 Syringe by Does not apply  route 4 (four) times daily - after meals and at bedtime. 06/30/15  Yes Arnoldo Morale, MD  levothyroxine (SYNTHROID, LEVOTHROID) 50 MCG tablet Take 1 tablet (50 mcg total) by mouth daily. 06/30/15  Yes Arnoldo Morale, MD  lisinopril (PRINIVIL,ZESTRIL) 20 MG tablet Take 1 tablet (20 mg total) by mouth daily. 06/30/15  Yes Arnoldo Morale, MD  nitroGLYCERIN (NITROSTAT) 0.4 MG SL tablet Place 1 tablet (0.4 mg total) under the tongue every 5 (five) minutes as needed for chest pain. 01/16/15  Yes Rogelia Mire, NP  pantoprazole (PROTONIX) 40 MG tablet Take 1 tablet (40 mg total) by mouth daily. 06/30/15 06/29/16 Yes Arnoldo Morale, MD  rivaroxaban (XARELTO) 20 MG TABS tablet Take 1 tablet (20 mg total) by mouth daily with supper. 06/30/15  Yes Arnoldo Morale, MD  TRUEPLUS LANCETS 28G MISC 1 Device by Does not apply route 3 (three) times daily before meals. 06/26/15  Yes Arnoldo Morale, MD   BP 94/45 mmHg  Pulse 79  Temp(Src) 102 F (38.9 C) (Rectal)  Resp 26  Ht 5\' 4"  (1.626 m)  Wt 225 lb (102.059 kg)  BMI 38.60 kg/m2  SpO2 95%  LMP 11/27/2012 Physical Exam  Constitutional: She is oriented to person, place, and time. She appears well-developed and well-nourished. No distress.  HENT:  Head: Normocephalic and atraumatic.  Nose: Nose normal.  Mouth/Throat: Oropharynx is clear and moist. No  oropharyngeal exudate.  Eyes: Conjunctivae and EOM are normal. Pupils are equal, round, and reactive to light. No scleral icterus.  Neck: Normal range of motion. Neck supple. No JVD present. No tracheal deviation present. No thyromegaly present.  Cardiovascular: Normal rate, regular rhythm and normal heart sounds.  Exam reveals no gallop and no friction rub.   No murmur heard. Pulmonary/Chest: Effort normal and breath sounds normal. No respiratory distress. She has no wheezes. She exhibits no tenderness.  Abdominal: Soft. Bowel sounds are normal. She exhibits no distension and no mass. There is tenderness. There is no rebound and no guarding.  Suprapubic tenderness to palpation.  Musculoskeletal: Normal range of motion. She exhibits no edema or tenderness.  Lymphadenopathy:    She has no cervical adenopathy.  Neurological: She is alert and oriented to person, place, and time. No cranial nerve deficit. She exhibits normal muscle tone.  Skin: Skin is warm and dry. No rash noted. She is not diaphoretic. No erythema. No pallor.  Nursing note and vitals reviewed.   ED Course  Procedures (including critical care time) Labs Review Labs Reviewed  CBC WITH DIFFERENTIAL/PLATELET - Abnormal; Notable for the following:    WBC 16.3 (*)    RBC 3.27 (*)    Hemoglobin 9.2 (*)    HCT 28.1 (*)    Neutrophils Relative % 83 (*)    Neutro Abs 13.6 (*)    Lymphocytes Relative 9 (*)    Monocytes Absolute 1.1 (*)    All other components within normal limits  COMPREHENSIVE METABOLIC PANEL - Abnormal; Notable for the following:    Sodium 133 (*)    Chloride 94 (*)    Glucose, Bld 353 (*)    BUN 33 (*)    Creatinine, Ser 1.15 (*)    Total Protein 6.3 (*)    Albumin 2.9 (*)    AST 42 (*)    Alkaline Phosphatase 312 (*)    Total Bilirubin 0.2 (*)    GFR calc non Af Amer 54 (*)    All other components within normal limits  URINALYSIS, ROUTINE W  REFLEX MICROSCOPIC (NOT AT [facility]) - Abnormal; Notable for  the following:    APPearance CLOUDY (*)    Glucose, UA 500 (*)    Hgb urine dipstick SMALL (*)    Protein, ur 100 (*)    All other components within normal limits  CBG MONITORING, ED - Abnormal; Notable for the following:    Glucose-Capillary 337 (*)    All other components within normal limits  CBG MONITORING, ED - Abnormal; Notable for the following:    Glucose-Capillary 251 (*)    All other components within normal limits  URINE CULTURE  LIPASE, BLOOD  URINE MICROSCOPIC-ADD ON  I-STAT CG4 LACTIC ACID, ED  I-STAT BETA HCG BLOOD, ED ([facility], [facility], [facility] ONLY)    Imaging Review No results found. I have personally reviewed and evaluated these images and lab results as part of my medical decision-making.   EKG Interpretation None      MDM   Final diagnoses:  None   patient presents emergency department for fever and weakness. Temperature here is 38.9. By history I cannot tell what her source is. Laboratory studies are unremarkable. Patient did have suprapubic tenderness, urinalysis pending. If negative patient will need CT scan of abdomen for further assessment. She was given 2 L of IV fluids, white count is 16.3.  CT scan is pending of C/A/P to eval for infection.  Patient signed out to Dr. Canary Brim.  If CT is negative, patient is safe for DC home and close PCP fu for further evaluation of fever.  Patient may also be safe for outpatient antibiotic therapy for simple infection.  Please see her note for the ultimate disposition of this patient.  [name], MD 07/04/15 1455

## 2015-07-04 NOTE — Discharge Instructions (Signed)
Return to the ED with any concerns including difficulty breathing, abdominal pain, vomiting and not able to keep down liquids, fainting, decreased level of alertness/lethargy, or any other alarming symptoms

## 2015-07-04 NOTE — ED Notes (Signed)
Pt off unit with CT 

## 2015-07-04 NOTE — ED Notes (Signed)
Pt CBG result was 337. Informed RN.

## 2015-07-04 NOTE — ED Provider Notes (Signed)
9:58 AM received in signout from Dr. Claudine Mouton, pending ct chest and abdomen which were both normal.  CBG is improved into the 200s after IV fluids.  Per Dr. Claudine Mouton, pt [facility] for discharge if nothing focal on the ct scans.  Discharged with strict return precautions.  Pt agreeable with plan.  Alfonzo Beers, MD 07/04/15 1000

## 2015-07-06 LAB — URINE CULTURE: Culture: >=100000

## 2015-07-08 ENCOUNTER — Telehealth (HOSPITAL_BASED_OUTPATIENT_CLINIC_OR_DEPARTMENT_OTHER): Payer: Self-pay | Admitting: Emergency Medicine

## 2015-07-09 ENCOUNTER — Telehealth: Payer: Self-pay

## 2015-07-09 NOTE — Telephone Encounter (Signed)
This Case Manager placed call to patient to follow-up on status and to remind patient of appointment on 07/14/15 at 1130 with Dr. Jarold Song.  Patient verbalized understanding and indicates she will be at her appointment. Patient indicates she will have a ride to her appointment.  Patient indicates she is feeling "fine;" no complaints at this time.  Discussed importance of medication compliance. Patient verbalized understanding and indicates she has all medications. Reminded patient to keep blood glucose log and to bring log to her appointment on 07/14/15. Patient verbalized understanding.

## 2015-07-11 ENCOUNTER — Telehealth (HOSPITAL_COMMUNITY): Payer: Self-pay | Admitting: Emergency Medicine

## 2015-07-11 NOTE — Telephone Encounter (Signed)
Post ED Visit - Positive Culture Follow-up: Unsuccessful Patient Follow-up   Positive Urine culture  [x]  Patient discharged without antimicrobial prescription and treatment is now indicated []  Organism is resistant to prescribed ED discharge antimicrobial []  Patient with positive blood cultures   Unable to contact patient after 3 attempts, letter will be sent to address on file  Ernesta Amble 07/11/2015, 2:10 PM

## 2015-07-13 ENCOUNTER — Telehealth: Payer: Self-pay

## 2015-07-13 NOTE — Telephone Encounter (Signed)
Call placed to the patient to check on her status and to confirm her appointment for tomorrow, 8/30 /16 @ 1130. She said that she will be at her appointment and has scheduled transportation through [facility]. She noted that she has all of her medications and will bring the medications and her blood sugar log to her appointment.  She said that she has been feeling "good" and reported that her blood sugar was 279 this morning but it has been in the 300's -400's.  She noted that the blood sugars are all recorded in her log.  She reported no other problems/questions.  This CM informed Dr Jarold Song that the ED has been trying to contact the patient to follow up re. Positive urine cultures and have been unsuccessful in reaching her after 3 attempts. Dr Jarold Song noted that she will address the + cultures at the visit tomorrow.

## 2015-07-14 ENCOUNTER — Ambulatory Visit: Payer: Medicaid Other | Attending: Family Medicine | Admitting: Family Medicine

## 2015-07-14 ENCOUNTER — Encounter: Payer: Self-pay | Admitting: Family Medicine

## 2015-07-14 ENCOUNTER — Encounter (HOSPITAL_BASED_OUTPATIENT_CLINIC_OR_DEPARTMENT_OTHER): Payer: Medicaid Other | Admitting: Clinical

## 2015-07-14 VITALS — BP 144/76 | HR 75 | Temp 97.8°F | Ht 64.0 in | Wt 228.0 lb

## 2015-07-14 DIAGNOSIS — I10 Essential (primary) hypertension: Secondary | ICD-10-CM | POA: Insufficient documentation

## 2015-07-14 DIAGNOSIS — I480 Paroxysmal atrial fibrillation: Secondary | ICD-10-CM | POA: Diagnosis not present

## 2015-07-14 DIAGNOSIS — I5033 Acute on chronic diastolic (congestive) heart failure: Secondary | ICD-10-CM

## 2015-07-14 DIAGNOSIS — J449 Chronic obstructive pulmonary disease, unspecified: Secondary | ICD-10-CM | POA: Diagnosis not present

## 2015-07-14 DIAGNOSIS — I779 Disorder of arteries and arterioles, unspecified: Secondary | ICD-10-CM

## 2015-07-14 DIAGNOSIS — I2510 Atherosclerotic heart disease of native coronary artery without angina pectoris: Secondary | ICD-10-CM | POA: Diagnosis not present

## 2015-07-14 DIAGNOSIS — I4891 Unspecified atrial fibrillation: Secondary | ICD-10-CM

## 2015-07-14 DIAGNOSIS — E039 Hypothyroidism, unspecified: Secondary | ICD-10-CM

## 2015-07-14 DIAGNOSIS — N390 Urinary tract infection, site not specified: Secondary | ICD-10-CM | POA: Insufficient documentation

## 2015-07-14 DIAGNOSIS — N3000 Acute cystitis without hematuria: Secondary | ICD-10-CM

## 2015-07-14 DIAGNOSIS — I5022 Chronic systolic (congestive) heart failure: Secondary | ICD-10-CM | POA: Diagnosis not present

## 2015-07-14 DIAGNOSIS — Z7901 Long term (current) use of anticoagulants: Secondary | ICD-10-CM | POA: Insufficient documentation

## 2015-07-14 DIAGNOSIS — I739 Peripheral vascular disease, unspecified: Secondary | ICD-10-CM

## 2015-07-14 DIAGNOSIS — E1165 Type 2 diabetes mellitus with hyperglycemia: Secondary | ICD-10-CM | POA: Diagnosis not present

## 2015-07-14 LAB — GLUCOSE, POCT (MANUAL RESULT ENTRY): POC GLUCOSE: 287 mg/dL — AB (ref 70–99)

## 2015-07-14 IMAGING — CR DG CHEST 2V
2 series · 2 of 2 positions shown · non-contrast
Comparison: Prior study from 10/13/2014

CLINICAL DATA: Initial evaluation for are chest pain.

EXAM:
CHEST  2 VIEW

[chest pa]
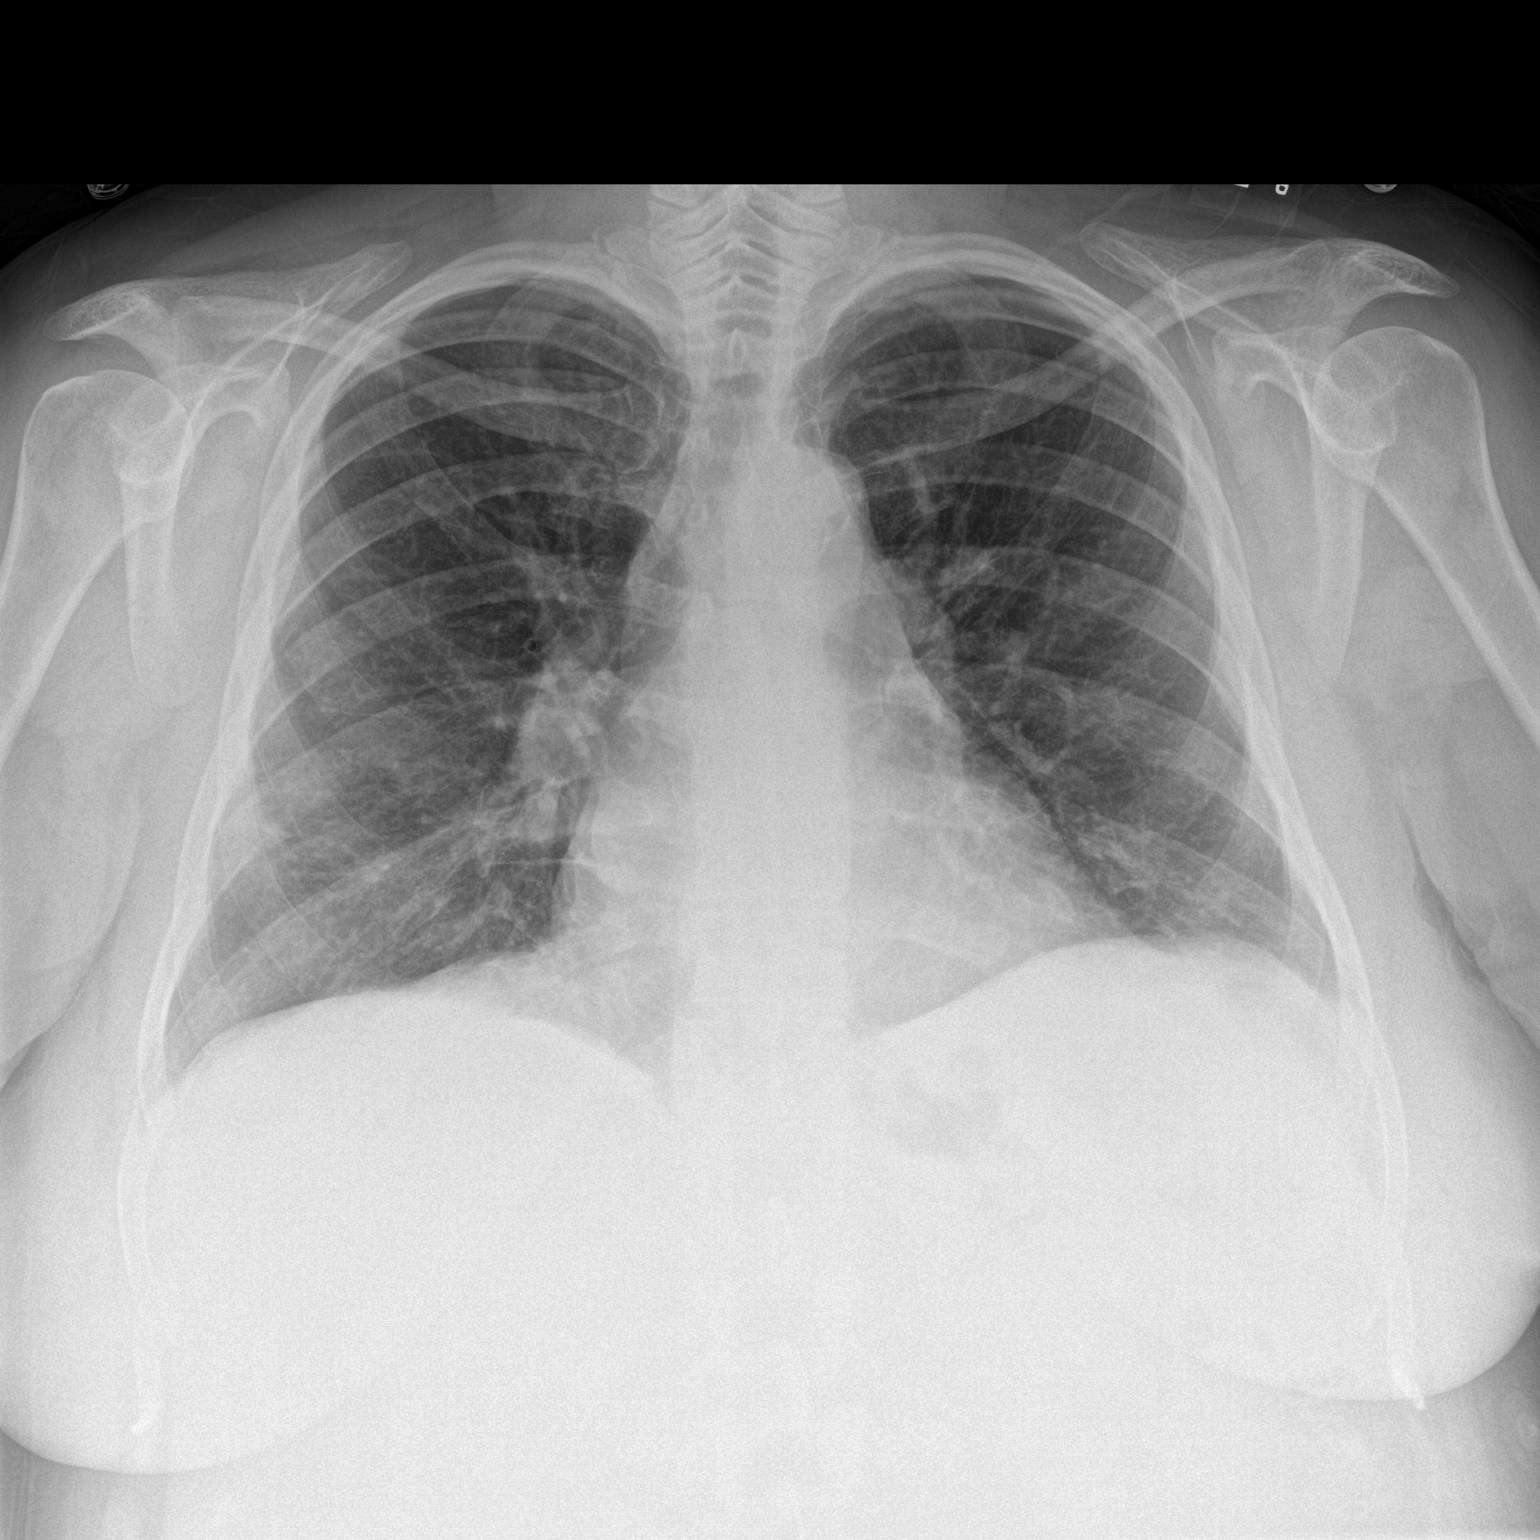

[chest lat]
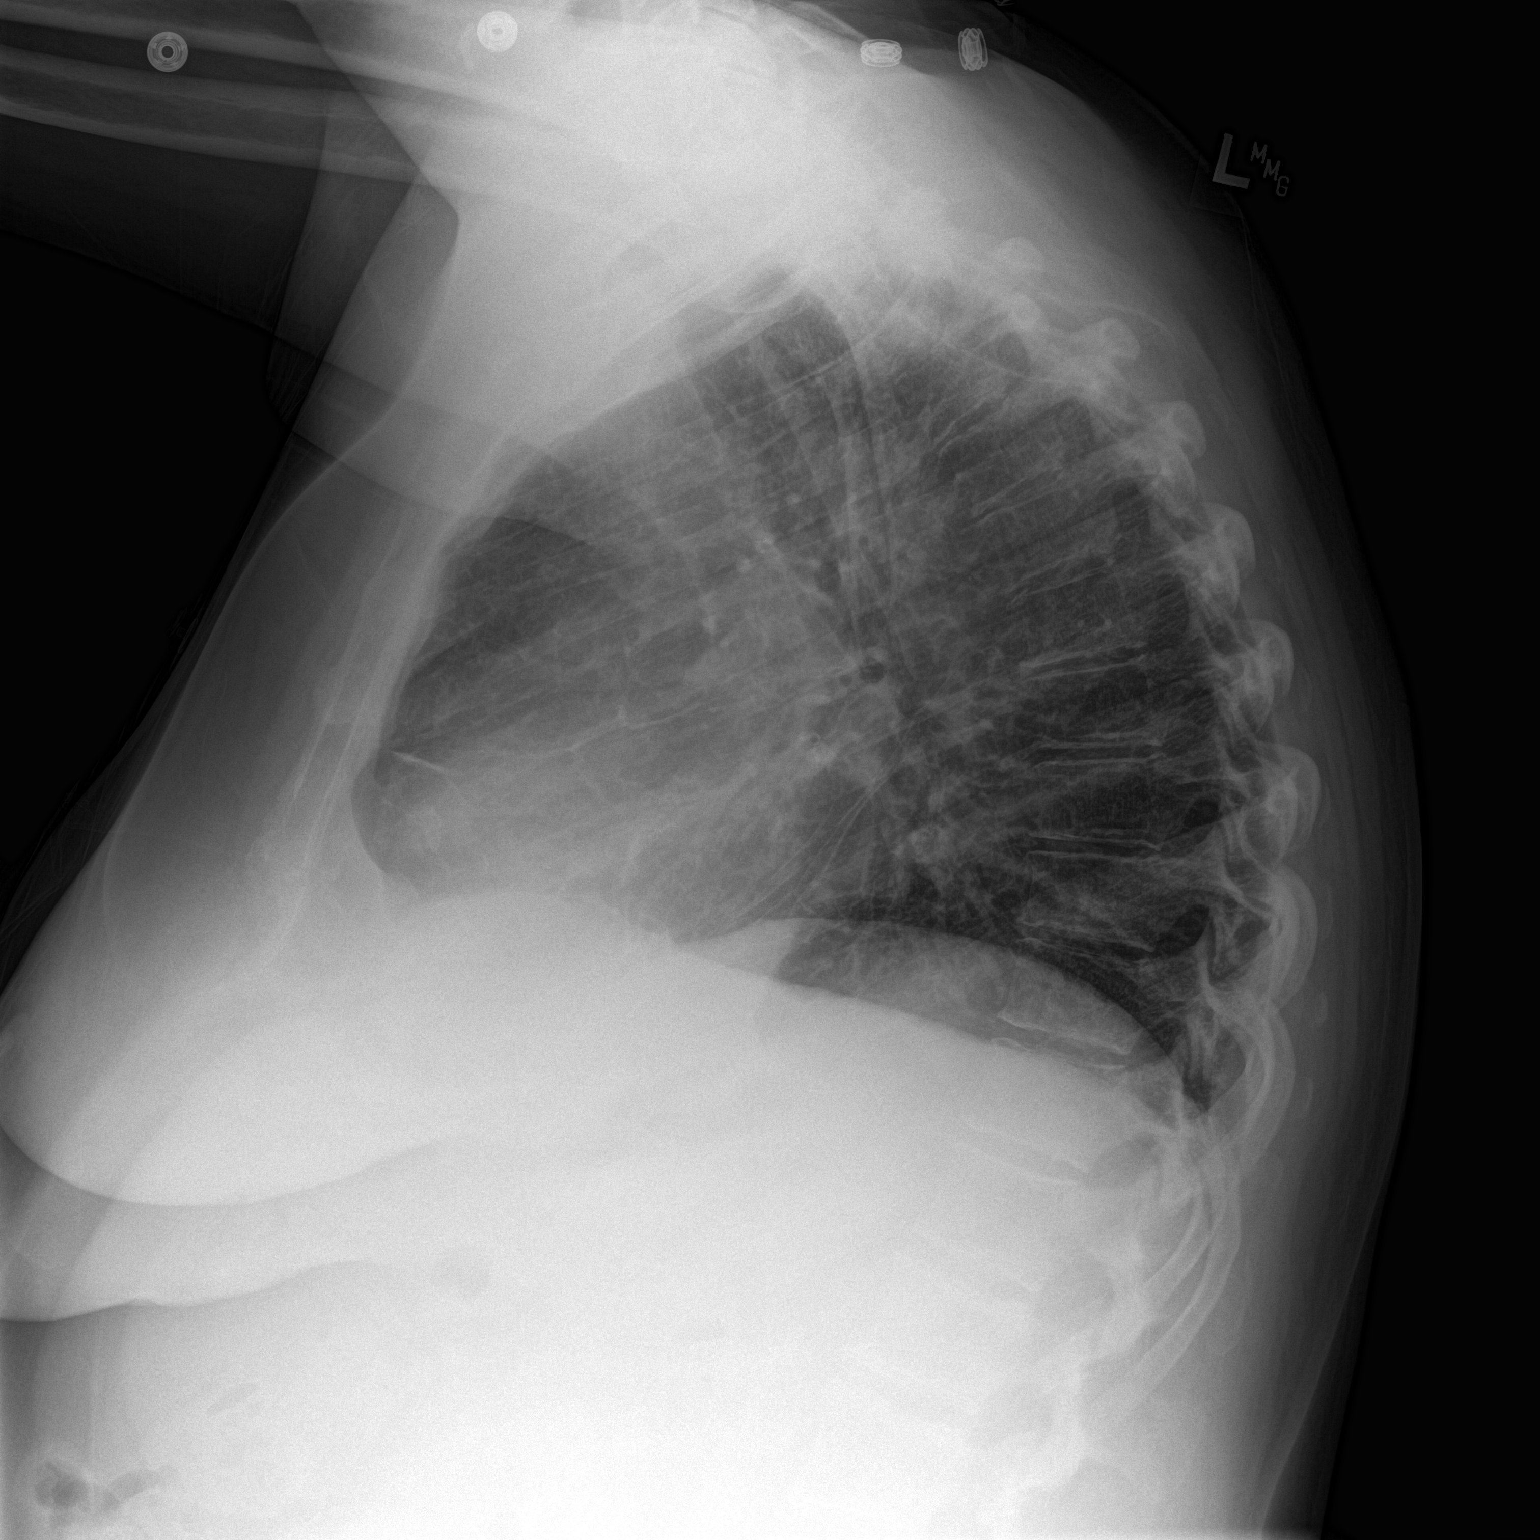

[2 of 2 positions shown; findings below may reference images not displayed]

FINDINGS: Cardiac and mediastinal silhouettes are stable in size and contour,
and remain within normal limits.

Lungs are mildly hypoinflated. There is mild diffuse prominence of
the interstitial markings, similar to prior, which may related to
history of smoking. No consolidative airspace disease. No pulmonary
edema or pleural effusion. No pneumothorax.

No acute osseous abnormality. Accentuation of the normal thoracic
kyphosis noted.
IMPRESSION: No active cardiopulmonary disease.

## 2015-07-14 MED ORDER — GABAPENTIN 400 MG PO CAPS: 400.0000 mg | ORAL_CAPSULE | Freq: Four times a day (QID) | ORAL | Status: DC

## 2015-07-14 MED ORDER — NITROFURANTOIN MONOHYD MACRO 100 MG PO CAPS: 100.0000 mg | ORAL_CAPSULE | Freq: Two times a day (BID) | ORAL | Status: DC

## 2015-07-14 MED ORDER — ACETAMINOPHEN-CODEINE #3 300-30 MG PO TABS: 1.0000 | ORAL_TABLET | Freq: Four times a day (QID) | ORAL | Status: DC | PRN

## 2015-07-14 MED ORDER — FUROSEMIDE 40 MG PO TABS: 60.0000 mg | ORAL_TABLET | Freq: Two times a day (BID) | ORAL | Status: DC

## 2015-07-14 NOTE — Progress Notes (Signed)
[name]  Date of first face-to-face visit: 06/30/15  PCP: None (previously Dr Kevan Ny)  Subjective:    Patient ID: [name], female    DOB: 1960-11-26, 54 y.o.   MRN: [id number]  HPI 54 year old female with a history of Uncontrolled Type 2 DM, HTN, A.fib, Hypothyroidism, CHF, CAD, PVD, carotid stenosis s/p L endarterectomy, COPD recently Hospitalized for Afib with RVR from 06/22/15- 06/25/15. She had presented to the ED with worsening palpitations and chest discomfort. She was also complaining of headache as well. In the ER she was found to be in Afib with RVR; cardiology was consulted ; Diltiazem was utilized and successfully helped with rate control. She was initially given adenosine because of the rapid ventricular rate which was unsuccessful.  Troponins were elevated at 0.05, 0.06, 0.11 which was thought to be secondary to demand ischemia. She had a 2d echo from 10/2014 which revealed an EF of 40-45%. Benadryl and Reglan was given for her headaches.  She did have a UTI which was treated with IV Rocephin during that hospitalization.  10 days ago she again presented to the [facility] ED with fever and chills as well as weakness; was found to have a leukocytosis of 16.3. CT abdomen and pelvis was negative for any acute intra-abdominal process and she received IV fluids and was subsequently discharged. A urine culture which was sent off at that time returned after the patient had been discharged revealing enterococcus sensitive to vancomycin, Levaquin, nitrofurantoin, amoxicillin.  Today she is concerned that she has gained weight and does admit that she had run out of her Lasix and so had been taking half of her usual dose. She also complains of burning in her feet and shooting pain as well and is requesting a refill of her gabapentin which helps with those symptoms as well as Tylenol 3. Blood sugar logs reveal elevated fasting sugars of 208, 231 and she endorses compliance  with her insulins.  Past Medical History  Diagnosis Date  . Alcohol abuse   . Narcotic abuse   . Cocaine abuse   . Marijuana abuse   . Tobacco abuse   . Alcoholic cirrhosis   . Cardiomyopathy   . Obesity   . Chronic systolic CHF (congestive heart failure)     a. Last echo 12/205: EF 40-45%, inferoapical/posterior HK, not technically sufficient to allow eval of LV diastolic function, normal LA in size..  . Hypothyroidism   . GERD (gastroesophageal reflux disease)   . PAF (paroxysmal atrial fibrillation)   . Critical lower limb ischemia   . Hypertension   . CAD (coronary artery disease)     a. cath 11/10/2014 small vessel CAD. Demand ischemia in the setting of rapid a-fib  . Pneumonia     END MARCH 2016  . Anxiety   . Wrist fracture     LEFT; "just did xray and cast"  . Poorly controlled type 2 diabetes mellitus   . Hyperlipemia   . Peripheral arterial disease     a. 01/2015 Angio/PTA: LSFA 100 w/ recon @ adductor canal and 1 vessel runoff via AT, RSFA 99 (atherectomy/pta) - 1 vessel runoff via diff dzs peroneal.  . Carotid artery disease     a. 01/2015 Carotid Angio: RICA 123XX123, LICA [id number]. s/p L carotid endarterectomy 02/2015.  [name] COPD (chronic obstructive pulmonary disease)   . Arthritis     "hands, arms, shoulders, legs, back" (04/29/2015)  . Diabetic peripheral neuropathy  Past Surgical History  Procedure Laterality Date  . Cardioversion  ~ 02/2013    "twice"   . Lower extremity angiogram N/A 09/10/2013    Procedure: LOWER EXTREMITY ANGIOGRAM;  Surgeon: [name], MD;  Location: [facility] CATH LAB;  Service: Cardiovascular;  Laterality: N/A;  . Left heart catheterization with coronary angiogram N/A 10/31/2014    Procedure: LEFT HEART CATHETERIZATION WITH CORONARY ANGIOGRAM;  Surgeon: Burnell Blanks, MD;  Location: [facility] CATH LAB;  Service: Cardiovascular;  Laterality: N/A;  . Peripheral athrectomy Right 01/15/2015    SFA/notes 01/15/2015  . Balloon angioplasty, artery  Right 01/15/2015    SFA/notes 01/15/2015  . Cardiac catheterization    . Lower extremity angiogram N/A 01/15/2015    Procedure: LOWER EXTREMITY ANGIOGRAM;  Surgeon: [name], MD;  Location: [facility] CATH LAB;  Service: Cardiovascular;  Laterality: N/A;  . Carotid angiogram N/A 01/15/2015    Procedure: CAROTID ANGIOGRAM;  Surgeon: [name], MD;  Location: [facility] C[facility] LAB;  Service: Cardiovascular;  Laterality: N/A;  . Endarterectomy Left 02/19/2015    Procedure: LEFT CAROTID ENDARTERECTOMY ;  Surgeon: Serafina Mitchell, MD;  Location: [facility] OR;  Service: Vascular;  Laterality: Left;  . Dilation and curettage of uterus  1988    Social History   Social History  . Marital Status: Divorced    Spouse Name: N/A  . Number of Children: N/A  . Years of Education: N/A   Occupational History  . disabled    Social History Main Topics  . Smoking status: Current Every Day Smoker -- 0.00 packs/day for 40 years    Types: E-cigarettes    Last Attempt to Quit: 02/19/2015  . Smokeless tobacco: Never Used  . Alcohol Use: No  . Drug Use: No     Comment: 04/29/2015 "last drug use was ~ 09/08/2013"  . Sexual Activity: No   Other Topics Concern  . Not on file   Social History Narrative    Allergies  Allergen Reactions  . Amiodarone Nausea And Vomiting    Current Outpatient Prescriptions on File Prior to Visit  Medication Sig Dispense Refill  . albuterol (PROVENTIL HFA;VENTOLIN HFA) 108 (90 BASE) MCG/ACT inhaler Inhale 2 puffs into the lungs every 6 (six) hours as needed for wheezing or shortness of breath. 1 each 3  . albuterol (PROVENTIL) (2.5 MG/3ML) 0.083% nebulizer solution Take 3 mLs (2.5 mg total) by nebulization every 6 (six) hours as needed. For wheezing. 75 mL 2  . atorvastatin (LIPITOR) 40 MG tablet Take 1 tablet (40 mg total) by mouth daily. 30 tablet 2  . Blood Glucose Monitoring Suppl (TRUE METRIX METER) DEVI 1 Device by Does not apply route 3 (three) times daily before meals. 1 Device 0    . carvedilol (COREG) 25 MG tablet Take 1 tablet (25 mg total) by mouth 2 (two) times daily with a meal. 60 tablet 0  . Cholecalciferol (VITAMIN D PO) Take 1 capsule by mouth daily.    . clopidogrel (PLAVIX) 75 MG tablet Take 1 tablet (75 mg total) by mouth daily with breakfast. 30 tablet 6  . glipiZIDE (GLUCOTROL XL) 10 MG 24 hr tablet Take 2 tablets (20 mg total) by mouth daily with breakfast. 60 tablet 3  . glucose blood (TRUE METRIX BLOOD GLUCOSE TEST) test strip Use as instructed 100 each 12  . insulin aspart (NOVOLOG) 100 UNIT/ML FlexPen Sliding scale (Patient taking differently: Inject 2-8 Units into the skin 3 (three) times daily with meals. Sliding scale) 15  mL 11  . Insulin Glargine (LANTUS) 100 UNIT/ML Solostar Pen Inject 45 units subcutaneous hs (Patient taking differently: Inject 45 Units into the skin at bedtime. Inject 45 units subcutaneous hs) 15 mL 11  . Insulin Pen Needle (ULTICARE MICRO PEN NEEDLES) 32G X 4 MM MISC 1 Syringe by Does not apply route 4 (four) times daily - after meals and at bedtime. 1 each 11  . levothyroxine (SYNTHROID, LEVOTHROID) 50 MCG tablet Take 1 tablet (50 mcg total) by mouth daily. 30 tablet 2  . lisinopril (PRINIVIL,ZESTRIL) 20 MG tablet Take 1 tablet (20 mg total) by mouth daily. 30 tablet 2  . nitroGLYCERIN (NITROSTAT) 0.4 MG SL tablet Place 1 tablet (0.4 mg total) under the tongue every 5 (five) minutes as needed for chest pain. 25 tablet 3  . pantoprazole (PROTONIX) 40 MG tablet Take 1 tablet (40 mg total) by mouth daily. 30 tablet 2  . rivaroxaban (XARELTO) 20 MG TABS tablet Take 1 tablet (20 mg total) by mouth daily with supper. 30 tablet 2  . TRUEPLUS LANCETS 28G MISC 1 Device by Does not apply route 3 (three) times daily before meals. 1 each 11   No current facility-administered medications on file prior to visit.      Review of Systems Constitutional: Negative for fever, chills, diaphoresis, activity change, appetite change and  fatigue. HENT: Negative for ear pain, nosebleeds, congestion, facial swelling, rhinorrhea, neck pain, neck stiffness and ear discharge.  Eyes: Negative for pain, discharge, redness, itching and visual disturbance. Respiratory: Negative for cough, choking, chest tightness, shortness of breath, wheezing and stridor.  Cardiovascular: Negative for chest pain, palpitations and leg swelling. Gastrointestinal: Negative for abdominal distention. Genitourinary: Negative for dysuria, urgency, frequency, hematuria, flank pain, decreased urine volume, difficulty urinating and dyspareunia.  Musculoskeletal: positive for knee pain Neurological: Negative for dizziness, tremors, seizures, syncope, facial asymmetry, speech difficulty, weakness, light-headedness and burning in feet, + numbness and no headaches.  Hematological: Negative for adenopathy. Does not bruise/bleed easily. Skin: Negative for rash, ulcer. Psychiatric/Behavioral: Negative for hallucinations, behavioral problems, confusion, dysphoric mood, decreased concentration and agitation.      Objective:  Filed Vitals:   07/14/15 1159  BP: 144/76  Pulse: 75  Temp: 97.8 F (36.6 C)  Height: 5\' 4"  (1.626 m)  Weight: 228 lb (103.42 kg)  SpO2: 96%      Physical Exam Constitutional: Patient appears well-developed and well-nourished. No distress. HENT: Normocephalic, atraumatic, External right and left ear normal. Oropharynx is clear and moist.  Eyes: Conjunctivae and EOM are normal. PERRLA, no scleral icterus. Neck: Normal ROM, No JVD. No tracheal deviation. No thyromegaly. CVS: RRR, S1/S2 +, no murmurs, no gallops, no carotid bruit, unable to palpate dorsalis pedis bilaterally  Pulmonary: Effort and breath sounds normal, no stridor, rhonchi, wheezes, rales.  Abdominal: Soft. BS +, no distension, tenderness, rebound or guarding.  Musculoskeletal: mild tenderness on ROM of knees Lymphadenopathy: No lymphadenopathy noted, cervical, inguinal or  axillary Neuro: Alert. Normal reflexes, muscle tone coordination. No cranial nerve deficit. Skin: Skin is warm and dry. No rash noted. Not diaphoretic. No erythema. No pallor. Psychiatric: Normal mood and affect. Behavior, judgment, thought content normal.    Filed Weights   07/14/15 1159  Weight: 228 lb (103.42 kg)       Lab Results  Component Value Date   HGBA1C 9.4* 06/24/2015    CMP Latest Ref Rng 07/04/2015 06/24/2015 06/23/2015  Glucose 65 - 99 mg/dL 353(H) 405(H) 253(H)  BUN 6 - 20 mg/dL 33(H)  27(H) 25(H)  Creatinine 0.44 - 1.00 mg/dL 1.15(H) 0.99 1.00  Sodium 135 - 145 mmol/L 133(L) 133(L) 139  Potassium 3.5 - 5.1 mmol/L 4.7 3.9 3.7  Chloride 101 - 111 mmol/L 94(L) 96(L) 101  CO2 22 - 32 mmol/L 28 27 27   Calcium 8.9 - 10.3 mg/dL 8.9 8.4(L) 8.7(L)  Total Protein 6.5 - 8.1 g/dL 6.3(L) - 6.6  Total Bilirubin 0.3 - 1.2 mg/dL 0.2(L) - 0.4  Alkaline Phos 38 - 126 U/L 312(H) - 194(H)  AST 15 - 41 U/L 42(H) - 24  ALT 14 - 54 U/L 35 - 30     Assessment & Plan:  54 year old female with a history of Uncontrolled Type 2 DM, HTN, A.fib, PVD, carotid stenosis s/p L carotid endarterectomy, Hypothyroidism, CHF, CAD, COPD here for follow-up visit in the transitional care clinic.  A.fib with RVR: [name] 2 score of 4                    Currently on anticoagulation with Xarelto. Rate control with Coreg. Seen by cardiology on 07/01/15, follow-up in 3 months with cardiology.  CHF: EF 40-45% Gained 3 pounds in the last 10 days. Continue daily weight checks, limit fluid intake to less than 2L/day Heart healthy low sodium diet  Uncontrolled Type 2DM: A1c 9.4, CBG 287 Increase Lantus to 50 units  We'll review blood sugar log at next visit.  HTN: Controlled Continue antihypertensives  CAD: Aggressive risk factor modification. Remains on Plavix and Statin  Hypothyroidism: Last TSH was normal-1.647 Continue Synthroid.  UTI: Urine culture from ED visit on 07/04/15 grew  enterococcus sensitive to nitrofurantoin which I have placed her on.

## 2015-07-14 NOTE — Progress Notes (Signed)
Patient is here to follow up on her DM and HTN She reports chronic pain in both legs burning and radiating in nature and is need of a refill of her Gabapentin She also needs refills on her test strips and her furosemide

## 2015-07-14 NOTE — Progress Notes (Signed)
ASSESSMENT: Pt currently experiencing symptoms of anxiety and depression as a result of her psychosocial circumstances; would benefit from continued supportive counseling regarding coping with psychosocial circumstances.  Stage of Change: precontemplative  PLAN: 1. F/U with behavioral health consultant in as needed 2. Psychiatric Medications: neurontin. 3. Behavioral recommendation(s):   -Consider trying apple cider vinegar drink for inflammation -Consider walking to fence and back daily SUBJECTIVE: Pt. referred by Dr Jarold Song for symptoms of depression and anxiety  Pt. reports the following symptoms/concerns: Pt states that she has gained weight, which makes her feel depressed. She used to ride a bike 18 miles/day, but is in too much pain to walk around her building. Her parents used to drink a vinegar drink daily for inflammation, and she is thinking about trying that daily. When other people are in her home telling her to walk, she thinks "I'm not doing anything because I don't have to"; says that it is easier to move when others are not around telling her what to do. Still living in hotel.  Duration of problem: one year Severity: mild  OBJECTIVE: Orientation & Cognition: Oriented x3. Thought processes normal and appropriate to situation. Mood: teary Affect: appropriate Appearance: appropriate Risk of harm to self or others: no risk of harm to self or others Substance use: tobacco Assessments administered: PHQ9: 17/ GAD7: 13  Diagnosis: Problem related to psychosocial circumstances CPT Code: Z65.9 -------------------------------------------- Other(s) present in the room: none  Time spent with patient in exam room: 20 minutes

## 2015-07-14 NOTE — Patient Instructions (Signed)

## 2015-07-17 ENCOUNTER — Telehealth: Payer: Self-pay

## 2015-07-17 NOTE — Telephone Encounter (Signed)
This Case Manager placed call to patient to check on status and remind her of appointment on 07/21/15 at 1200. Unable to reach patient; voicemail left requesting return call.

## 2015-07-18 IMAGING — CR DG CHEST 1V PORT
1 series · 1 of 1 positions shown · non-contrast
Comparison: 10/29/2014

CLINICAL DATA: CHF, shortness of breath, right-sided chest pain

EXAM:
PORTABLE CHEST - 1 VIEW

[AP]
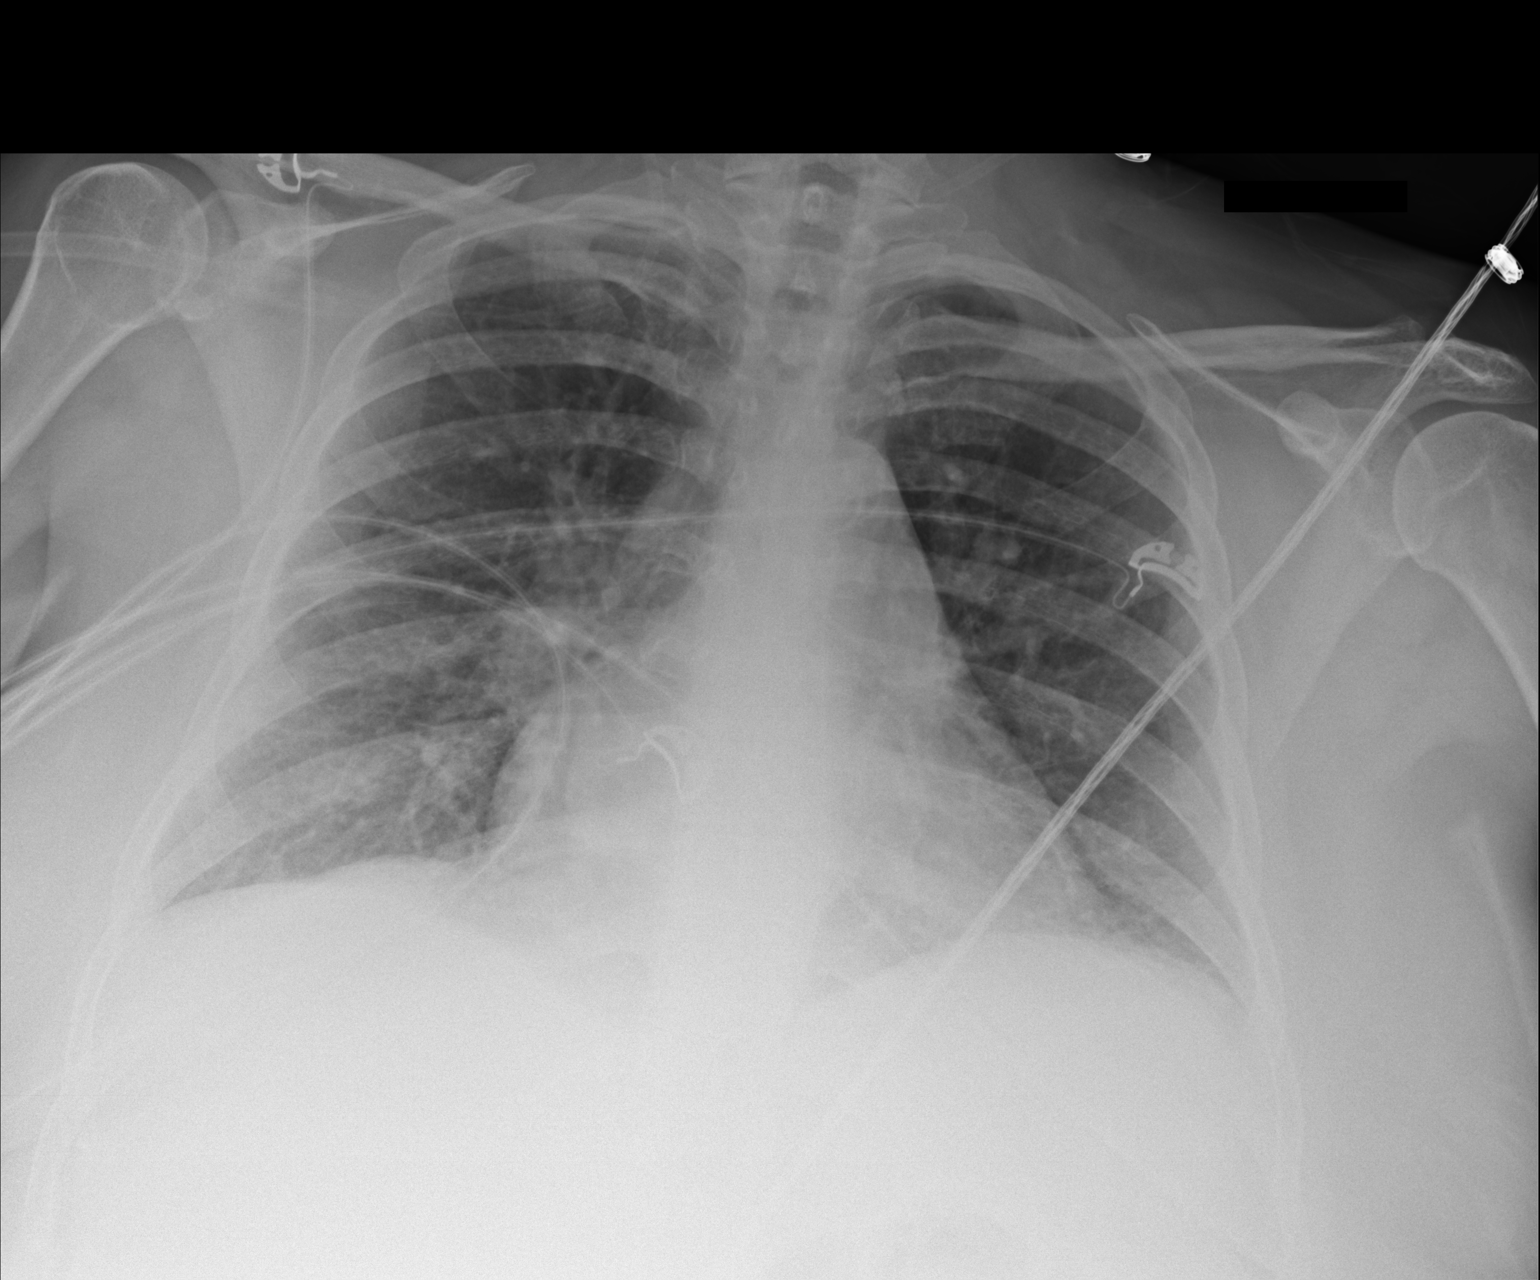

[1 of 1 positions shown; findings below may reference images not displayed]

FINDINGS: Mild bilateral interstitial thickening. There is no focal
parenchymal opacity, pleural effusion, or pneumothorax. The heart
and mediastinal contours are unremarkable.

The osseous structures are unremarkable.
IMPRESSION: No active disease.

## 2015-07-21 ENCOUNTER — Ambulatory Visit: Payer: Medicaid Other | Attending: Family Medicine | Admitting: Family Medicine

## 2015-07-21 ENCOUNTER — Encounter: Payer: Self-pay | Admitting: Family Medicine

## 2015-07-21 ENCOUNTER — Other Ambulatory Visit: Payer: Self-pay | Admitting: *Deleted

## 2015-07-21 ENCOUNTER — Encounter: Payer: Self-pay | Admitting: *Deleted

## 2015-07-21 VITALS — BP 150/92 | HR 76 | Temp 98.5°F | Resp 18 | Ht 64.0 in | Wt 224.6 lb

## 2015-07-21 DIAGNOSIS — I5032 Chronic diastolic (congestive) heart failure: Secondary | ICD-10-CM | POA: Diagnosis not present

## 2015-07-21 DIAGNOSIS — E1165 Type 2 diabetes mellitus with hyperglycemia: Secondary | ICD-10-CM | POA: Insufficient documentation

## 2015-07-21 DIAGNOSIS — M791 Myalgia, unspecified site: Secondary | ICD-10-CM

## 2015-07-21 DIAGNOSIS — E039 Hypothyroidism, unspecified: Secondary | ICD-10-CM | POA: Diagnosis not present

## 2015-07-21 DIAGNOSIS — I4891 Unspecified atrial fibrillation: Secondary | ICD-10-CM

## 2015-07-21 DIAGNOSIS — Z794 Long term (current) use of insulin: Secondary | ICD-10-CM | POA: Diagnosis not present

## 2015-07-21 DIAGNOSIS — D500 Iron deficiency anemia secondary to blood loss (chronic): Secondary | ICD-10-CM | POA: Diagnosis not present

## 2015-07-21 DIAGNOSIS — Z720 Tobacco use: Secondary | ICD-10-CM | POA: Insufficient documentation

## 2015-07-21 DIAGNOSIS — I779 Disorder of arteries and arterioles, unspecified: Secondary | ICD-10-CM | POA: Insufficient documentation

## 2015-07-21 DIAGNOSIS — R748 Abnormal levels of other serum enzymes: Secondary | ICD-10-CM | POA: Diagnosis not present

## 2015-07-21 DIAGNOSIS — I739 Peripheral vascular disease, unspecified: Secondary | ICD-10-CM

## 2015-07-21 DIAGNOSIS — N10 Acute tubulo-interstitial nephritis: Secondary | ICD-10-CM | POA: Insufficient documentation

## 2015-07-21 LAB — CBC WITH DIFFERENTIAL/PLATELET
BASOS PCT: 0 % (ref 0–1)
Basophils Absolute: 0 10*3/uL (ref 0.0–0.1)
EOS ABS: 0.6 10*3/uL (ref 0.0–0.7)
Eosinophils Relative: 5 % (ref 0–5)
HCT: 33.3 % — ABNORMAL LOW (ref 36.0–46.0)
HEMOGLOBIN: 10.7 g/dL — AB (ref 12.0–15.0)
Lymphocytes Relative: 18 % (ref 12–46)
Lymphs Abs: 2.1 10*3/uL (ref 0.7–4.0)
MCH: 28.2 pg (ref 26.0–34.0)
MCHC: 32.1 g/dL (ref 30.0–36.0)
MCV: 87.9 fL (ref 78.0–100.0)
MONOS PCT: 6 % (ref 3–12)
MPV: 11.1 fL (ref 8.6–12.4)
Monocytes Absolute: 0.7 10*3/uL (ref 0.1–1.0)
NEUTROS ABS: 8.3 10*3/uL — AB (ref 1.7–7.7)
NEUTROS PCT: 71 % (ref 43–77)
PLATELETS: 343 10*3/uL (ref 150–400)
RBC: 3.79 MIL/uL — AB (ref 3.87–5.11)
RDW: 14.2 % (ref 11.5–15.5)
WBC: 11.7 10*3/uL — AB (ref 4.0–10.5)

## 2015-07-21 LAB — COMPREHENSIVE METABOLIC PANEL
ALT: 26 U/L (ref 6–29)
AST: 20 U/L (ref 10–35)
Albumin: 3.4 g/dL — ABNORMAL LOW (ref 3.6–5.1)
Alkaline Phosphatase: 281 U/L — ABNORMAL HIGH (ref 33–130)
BILIRUBIN TOTAL: 0.4 mg/dL (ref 0.2–1.2)
BUN: 31 mg/dL — AB (ref 7–25)
CHLORIDE: 91 mmol/L — AB (ref 98–110)
CO2: 31 mmol/L (ref 20–31)
CREATININE: 0.95 mg/dL (ref 0.50–1.05)
Calcium: 9.3 mg/dL (ref 8.6–10.4)
Glucose, Bld: 439 mg/dL — ABNORMAL HIGH (ref 65–99)
Potassium: 4.5 mmol/L (ref 3.5–5.3)
SODIUM: 134 mmol/L — AB (ref 135–146)
TOTAL PROTEIN: 6.9 g/dL (ref 6.1–8.1)

## 2015-07-21 LAB — GLUCOSE, POCT (MANUAL RESULT ENTRY)
POC GLUCOSE: 455 mg/dL — AB (ref 70–99)
POC Glucose: 405 mg/dl — AB (ref 70–99)

## 2015-07-21 MED ORDER — GLUCOSE BLOOD VI STRP: ORAL_STRIP | Status: DC

## 2015-07-21 MED ORDER — INSULIN GLARGINE 100 UNIT/ML SOLOSTAR PEN: PEN_INJECTOR | SUBCUTANEOUS | Status: DC

## 2015-07-21 MED ORDER — INSULIN ASPART 100 UNIT/ML ~~LOC~~ SOLN
10.0000 [IU] | Freq: Once | SUBCUTANEOUS | Status: AC
  Administered 2015-07-21: 10 [IU] via SUBCUTANEOUS

## 2015-07-21 MED ORDER — ACCU-CHEK AVIVA PLUS W/DEVICE KIT: 1.0000 | PACK | Freq: Three times a day (TID) | Status: DC

## 2015-07-21 MED ORDER — CYCLOBENZAPRINE HCL 10 MG PO TABS: 10.0000 mg | ORAL_TABLET | Freq: Three times a day (TID) | ORAL | Status: DC | PRN

## 2015-07-21 MED ORDER — ACCU-CHEK SOFT TOUCH LANCETS MISC: Status: DC

## 2015-07-21 NOTE — Progress Notes (Signed)
Patient following up from last week visit. Patient complains of chest pain, scaled at a 10, described as sharp pain. Patients states "I may have slept wrong". Patient states pain worsens with movement and has been constant since she woke up this morning. Patient woke up 3 times during the night due to pain Blood Sugar 455 Patient needs refill on triamcinolone (not prescribed through [facility]) and test strips

## 2015-07-21 NOTE — Progress Notes (Signed)
[place]  PCP: Dr [name] ([place])  Subjective:    Patient ID: [name], female    DOB: 02-27-1961, 54 y.o.   MRN: [id number]  HPI 54 year old female with a history of Uncontrolled Type 2 DM, HTN, A.fib, Hypothyroidism, CHF, CAD, PVD, carotid stenosis s/p L endarterectomy, COPD recently Hospitalized for Afib with RVR from 06/22/15- 06/25/15 which responded well to Diltiazem. She is currently on nitrofurantoin for recurrent UTI after failing treatment with IV ceftriaxone which she had during hospitalization.  Lantus was increased to 50 units at her last office visit but her blood sugar logs today reveal her sugars running in the 300s to 500s despite compliance; she also takes mealtime insulin on the sliding scale. Blood sugar log in the clinic is 455 today. She complains of left shoulder pain and pain around the muscle off her left upper chest wall and is unsure if she slept wrong. Complains of pain in her right knee pain in all joints of the body which has been going on for 1 week.  She does have a new primary care physician which has been assigned to her and this will be [facility] on [address].   Past Medical History  Diagnosis Date  . Alcohol abuse   . Narcotic abuse   . Cocaine abuse   . Marijuana abuse   . Tobacco abuse   . Alcoholic cirrhosis   . Cardiomyopathy   . Obesity   . Chronic systolic CHF (congestive heart failure)     a. Last echo 12/205: EF 40-45%, inferoapical/posterior HK, not technically sufficient to allow eval of LV diastolic function, normal LA in size..  . Hypothyroidism   . GERD (gastroesophageal reflux disease)   . PAF (paroxysmal atrial fibrillation)   . Critical lower limb ischemia   . Hypertension   . CAD (coronary artery disease)     a. cath 11/10/2014 small vessel CAD. Demand ischemia in the setting of rapid a-fib  . Pneumonia     END MARCH 2016  . Anxiety   . Wrist fracture     LEFT; "just did xray  and cast"  . Poorly controlled type 2 diabetes mellitus   . Hyperlipemia   . Peripheral arterial disease     a. 01/2015 Angio/PTA: LSFA 100 w/ recon @ adductor canal and 1 vessel runoff via AT, RSFA 99 (atherectomy/pta) - 1 vessel runoff via diff dzs peroneal.  . Carotid artery disease     a. 01/2015 Carotid Angio: RICA [id number], LICA [id number]. s/p L carotid endarterectomy 02/2015.  [name] COPD (chronic obstructive pulmonary disease)   . Arthritis     "hands, arms, shoulders, legs, back" (04/29/2015)  . Diabetic peripheral neuropathy     Past Surgical History  Procedure Laterality Date  . Cardioversion  ~ 02/2013    "twice"   . Lower extremity angiogram N/A 09/10/2013    Procedure: LOWER EXTREMITY ANGIOGRAM;  Surgeon: [name], MD;  Location: [facility] CATH LAB;  Service: Cardiovascular;  Laterality: N/A;  . Left heart catheterization with coronary angiogram N/A 10/31/2014    Procedure: LEFT HEART CATHETERIZATION WITH CORONARY ANGIOGRAM;  Surgeon: Burnell Blanks, MD;  Location: [facility] CATH LAB;  Service: Cardiovascular;  Laterality: N/A;  . Peripheral athrectomy Right 01/15/2015    SFA/notes 01/15/2015  . Balloon angioplasty, artery Right 01/15/2015    SFA/notes 01/15/2015  . Cardiac catheterization    . Lower extremity angiogram N/A 01/15/2015    Procedure: LOWER EXTREMITY ANGIOGRAM;  Surgeon: [name], MD;  Location: [facility] CATH LAB;  Service: Cardiovascular;  Laterality: N/A;  . Carotid angiogram N/A 01/15/2015    Procedure: CAROTID ANGIOGRAM;  Surgeon: [name], MD;  Location: [facility] CATH LAB;  Service: Cardiovascular;  Laterality: N/A;  . Endarterectomy Left 02/19/2015    Procedure: LEFT CAROTID ENDARTERECTOMY ;  Surgeon: Serafina Mitchell, MD;  Location: [facility] OR;  Service: Vascular;  Laterality: Left;  . Dilation and curettage of uterus  1988    Social History   Social History  . Marital Status: Divorced    Spouse Name: N/A  . Number of Children: N/A  . Years of Education: N/A   Occupational  History  . disabled    Social History Main Topics  . Smoking status: Current Every Day Smoker -- 0.00 packs/day for 40 years    Types: E-cigarettes    Last Attempt to Quit: 02/19/2015  . Smokeless tobacco: Never Used  . Alcohol Use: No  . Drug Use: No     Comment: 04/29/2015 "last drug use was ~ 09/08/2013"  . Sexual Activity: No   Other Topics Concern  . Not on file   Social History Narrative    Allergies  Allergen Reactions  . Amiodarone Nausea And Vomiting     Review of Systems Constitutional: Negative for fever, chills, diaphoresis, activity change, appetite change and fatigue. HENT: Negative for ear pain, nosebleeds, congestion, facial swelling, rhinorrhea, neck pain, neck stiffness and ear discharge.  Eyes: Negative for pain, discharge, redness, itching and visual disturbance. Respiratory: Negative for cough, choking, chest tightness, shortness of breath, wheezing and stridor.  Cardiovascular: Negative for chest pain, palpitations and leg swelling. Gastrointestinal: Negative for abdominal distention. Genitourinary: Negative for dysuria, urgency, frequency, hematuria, flank pain, decreased urine volume, difficulty urinating and dyspareunia.  Musculoskeletal: positive for left shoulder pain,positive for knee pain Neurological: Negative for dizziness, tremors, seizures, syncope, facial asymmetry, speech difficulty, weakness, light-headedness and burning in feet, + numbness and no headaches.  Hematological: Negative for adenopathy. Does not bruise/bleed easily. Skin: Negative for rash, ulcer. Psychiatric/Behavioral: Negative for hallucinations, behavioral problems, confusion, dysphoric mood, decreased concentration and agitation.      Objective: Filed Vitals:   07/21/15 1201  BP: 150/92  Pulse: 76  Temp: 98.5 F (36.9 C)  TempSrc: Oral  Resp: 18  Height: 5\' 4"  (1.626 m)  Weight: 224 lb 9.6 oz (101.878 kg)  SpO2: 94%       Physical Exam   Constitutional:  Patient appears well-developed and well-nourished. No distress. HENT: Normocephalic, atraumatic, External right and left ear normal. Oropharynx is clear and moist.  Eyes: Conjunctivae and EOM are normal. PERRLA, no scleral icterus. Neck: Normal ROM, No JVD. No tracheal deviation. No thyromegaly. CVS: RRR, S1/S2 +, no murmurs, no gallops, no carotid bruit, unable to palpate dorsalis pedis bilaterally  Pulmonary: Effort and breath sounds normal, no stridor, rhonchi, wheezes, rales.  Abdominal: Soft. BS +, no distension, tenderness, rebound or guarding.  Musculoskeletal: mild tenderness in left shoulder and on palpation of pectoralis muscle,mild tenderness on ROM of R knee Lymphadenopathy: No lymphadenopathy noted, cervical, inguinal or axillary Neuro: Alert. Normal reflexes, muscle tone coordination. No cranial nerve deficit. Skin: Skin is warm and dry. No rash noted. Not diaphoretic. No erythema. No pallor. Psychiatric: Normal mood and affect. Behavior, judgment, thought content normal.     Wt Readings from Last 3 Encounters:  07/21/15 224 lb 9.6 oz (101.878 kg)  07/14/15 228 lb (103.42 kg)  07/04/15 225 lb (  102.059 kg)       Assessment & Plan:  54 year old female with a history of Uncontrolled Type 2 DM, HTN, A.fib, PVD, carotid stenosis s/p L carotid endarterectomy, Hypothyroidism, CHF, CAD, COPD here for follow-up visit in the transitional care clinic.  A.fib with RVR: [name] 2 score of 4                    Currently on anticoagulation with Xarelto. Rate control with Coreg. Follow-up in 3 months with cardiology.  CHF: EF 40-45% Lost 4 pounds in the last 7 days. Continue daily weight checks, limit fluid intake to less than 2L/day Heart healthy low sodium diet  Uncontrolled Type 2 DM: A1c 9.4, CBG 455 Novolog 10 units administered, patient observed in the clinic for 30 minutes after which blood sugar repeated. Increase Lantus to 60 units  Blood sugars will be reviewed by  primary care physician going forward. Will schedule for DM teaching with the health    HTN: Uncontrolled, we'll make no changes to regimen as blood pressure was previously controlled at last office visit.  Continue antihypertensives.  CAD: Aggressive risk factor modification. Remains on Plavix and Statin  Hypothyroidism: Last TSH was normal-1.647 Continue Synthroid.  UTI: Asymptomatic. Currently on a course of nitrofurantoin.  Elevated alkaline phosphatase: Repeat CMET   Her medical care will be transitioned to her primary care physician - spoke with Dr [name] on the phone regarding patient's care; will fax records over to [place]

## 2015-07-21 NOTE — Patient Instructions (Signed)
Diabetes Mellitus and Food It is important for you to manage your blood sugar (glucose) level. Your blood glucose level can be greatly affected by what you eat. Eating healthier foods in the appropriate amounts throughout the day at about the same time each day will help you control your blood glucose level. It can also help slow or prevent worsening of your diabetes mellitus. Healthy eating may even help you improve the level of your blood pressure and reach or maintain a healthy weight.  HOW CAN FOOD AFFECT ME? Carbohydrates Carbohydrates affect your blood glucose level more than any other type of food. Your dietitian will help you determine how many carbohydrates to eat at each meal and teach you how to count carbohydrates. Counting carbohydrates is important to keep your blood glucose at a healthy level, especially if you are using insulin or taking certain medicines for diabetes mellitus. Alcohol Alcohol can cause sudden decreases in blood glucose (hypoglycemia), especially if you use insulin or take certain medicines for diabetes mellitus. Hypoglycemia can be a life-threatening condition. Symptoms of hypoglycemia (sleepiness, dizziness, and disorientation) are similar to symptoms of having too much alcohol.  If your health care provider has given you approval to drink alcohol, do so in moderation and use the following guidelines:  Women should not have more than one drink per day, and men should not have more than two drinks per day. One drink is equal to:  12 oz of beer.  5 oz of wine.  1 oz of hard liquor.  Do not drink on an empty stomach.  Keep yourself hydrated. Have water, diet soda, or unsweetened iced tea.  Regular soda, juice, and other mixers might contain a lot of carbohydrates and should be counted. WHAT FOODS ARE NOT RECOMMENDED? As you make food choices, it is important to remember that all foods are not the same. Some foods have fewer nutrients per serving than other  foods, even though they might have the same number of calories or carbohydrates. It is difficult to get your body what it needs when you eat foods with fewer nutrients. Examples of foods that you should avoid that are high in calories and carbohydrates but low in nutrients include:  Trans fats (most processed foods list trans fats on the Nutrition Facts label).  Regular soda.  Juice.  Candy.  Sweets, such as cake, pie, doughnuts, and cookies.  Fried foods. WHAT FOODS CAN I EAT? Have nutrient-rich foods, which will nourish your body and keep you healthy. The food you should eat also will depend on several factors, including:  The calories you need.  The medicines you take.  Your weight.  Your blood glucose level.  Your blood pressure level.  Your cholesterol level. You also should eat a variety of foods, including:  Protein, such as meat, poultry, fish, tofu, nuts, and seeds (lean animal proteins are best).  Fruits.  Vegetables.  Dairy products, such as milk, cheese, and yogurt (low fat is best).  Breads, grains, pasta, cereal, rice, and beans.  Fats such as olive oil, trans fat-free margarine, canola oil, avocado, and olives. DOES EVERYONE WITH DIABETES MELLITUS HAVE THE SAME MEAL PLAN? Because every person with diabetes mellitus is different, there is not one meal plan that works for everyone. It is very important that you meet with a dietitian who will help you create a meal plan that is just right for you. Document Released: 07/28/2005 Document Revised: 11/05/2013 Document Reviewed: 09/27/2013 ExitCare Patient Information 2015 [employer]. This   information is not intended to replace advice given to you by your health care provider. Make sure you discuss any questions you have with your health care provider.  

## 2015-07-21 NOTE — Progress Notes (Unsigned)
Patient ID: [name], female   DOB: October 15, 1961, 54 y.o.   MRN: [id number]   Per Dr. Amao-patient's last office visit notes were faxed to patient's new PCP at [place] fax [phone]

## 2015-07-23 ENCOUNTER — Telehealth: Payer: Self-pay | Admitting: *Deleted

## 2015-07-23 ENCOUNTER — Telehealth (HOSPITAL_COMMUNITY): Payer: Self-pay

## 2015-07-23 NOTE — Telephone Encounter (Signed)
Unable to contact pt by mail or telephone. Unable to communicate lab results or treatment changes. 

## 2015-07-23 NOTE — Telephone Encounter (Signed)
Verified name and date of birth and told patient that her  Hemoglobin has improved slightly; labs reveal she is somewhat dehydrated and she will need to increase fluid intake. Glucose is elevated and we had addressed this at her last visit. Alkaline phosphate is elevated- could be secondary to bone pains (her knee joint) will monitor for downward trend.   Patient states she is drinking 3 bottles of water a day but would increase her intake of water.  She verbalized understanding of these results after I explained that the alkaline phosphate increase could be due to her joint pain.  I told her that at her next visit the MD would redraw her labs to check to make sure things were within normal results.

## 2015-07-23 NOTE — Telephone Encounter (Signed)
-----   Message from Arnoldo Morale, MD sent at 07/22/2015 10:41 PM EDT ----- Hemoglobin has improved slightly; labs reveal she is somewhat dehydrated and she will need to increase fluid intake. Glucose is elevated and we had addressed this at her last visit. Alkaline phosphate is elevated- could be secondary to bone pains (her knee joint) will monitor for downward trend.

## 2015-08-03 ENCOUNTER — Other Ambulatory Visit: Payer: Self-pay | Admitting: Internal Medicine

## 2015-08-04 NOTE — Telephone Encounter (Signed)
She was transitioned to Dr [name] of [facility] (whom I spoke with) on [address] who will refill all her medications.

## 2015-08-18 ENCOUNTER — Telehealth (HOSPITAL_COMMUNITY): Payer: Self-pay | Admitting: Cardiac Rehabilitation

## 2015-08-18 NOTE — Telephone Encounter (Signed)
pc to pt for nursing telephone interview prior to beginning cardiac rehab.  Pt stated her CBG at home have been 400-500 consistently.  Pt states her diabetes is managed by her PCP, Dr. [name], [facility]. Pt has appt with Dr. [name] 08/19/15 to evaluate her diabetes.  Pt verbalizes uncertainty about her medication regimen and reports she has food disparities in her home.    PC to Dr. Lynnda Child nurse to request home health nursing evaluate and treat pt in her home to assist with diabetes management.  Dr. Lynnda Child nurse will coordinate this referral.  Pt orientation rescheduled to 09/03/15 to allow time for CBG readings to improve.  Pt verbalized understanding

## 2015-08-20 ENCOUNTER — Ambulatory Visit (HOSPITAL_COMMUNITY): Payer: Medicaid Other

## 2015-08-24 ENCOUNTER — Ambulatory Visit (HOSPITAL_COMMUNITY): Payer: Medicaid Other

## 2015-08-26 ENCOUNTER — Ambulatory Visit (HOSPITAL_COMMUNITY): Payer: Medicaid Other

## 2015-08-28 ENCOUNTER — Ambulatory Visit (HOSPITAL_COMMUNITY): Payer: Medicaid Other

## 2015-08-31 ENCOUNTER — Ambulatory Visit (HOSPITAL_COMMUNITY): Payer: Medicaid Other

## 2015-09-02 ENCOUNTER — Ambulatory Visit (HOSPITAL_COMMUNITY): Payer: Medicaid Other

## 2015-09-03 ENCOUNTER — Ambulatory Visit (HOSPITAL_COMMUNITY): Payer: Medicaid Other

## 2015-09-04 ENCOUNTER — Emergency Department (HOSPITAL_COMMUNITY): Payer: Medicaid Other

## 2015-09-04 ENCOUNTER — Ambulatory Visit (HOSPITAL_COMMUNITY): Payer: Medicaid Other

## 2015-09-04 ENCOUNTER — Inpatient Hospital Stay (HOSPITAL_COMMUNITY)
Admission: EM | Admit: 2015-09-04 | Discharge: 2015-09-07 | DRG: 309 | Disposition: A | Discharge status: Home / Self Care | Payer: Medicaid Other | Attending: Internal Medicine | Admitting: Internal Medicine

## 2015-09-04 ENCOUNTER — Encounter (HOSPITAL_COMMUNITY): Payer: Self-pay | Admitting: *Deleted

## 2015-09-04 DIAGNOSIS — E1165 Type 2 diabetes mellitus with hyperglycemia: Secondary | ICD-10-CM | POA: Diagnosis present

## 2015-09-04 DIAGNOSIS — K7031 Alcoholic cirrhosis of liver with ascites: Secondary | ICD-10-CM

## 2015-09-04 DIAGNOSIS — E860 Dehydration: Secondary | ICD-10-CM | POA: Diagnosis present

## 2015-09-04 DIAGNOSIS — M5430 Sciatica, unspecified side: Secondary | ICD-10-CM | POA: Diagnosis present

## 2015-09-04 DIAGNOSIS — J449 Chronic obstructive pulmonary disease, unspecified: Secondary | ICD-10-CM | POA: Diagnosis present

## 2015-09-04 DIAGNOSIS — R0609 Other forms of dyspnea: Secondary | ICD-10-CM

## 2015-09-04 DIAGNOSIS — F17210 Nicotine dependence, cigarettes, uncomplicated: Secondary | ICD-10-CM

## 2015-09-04 DIAGNOSIS — I6523 Occlusion and stenosis of bilateral carotid arteries: Secondary | ICD-10-CM

## 2015-09-04 DIAGNOSIS — J441 Chronic obstructive pulmonary disease with (acute) exacerbation: Secondary | ICD-10-CM

## 2015-09-04 DIAGNOSIS — J438 Other emphysema: Secondary | ICD-10-CM

## 2015-09-04 DIAGNOSIS — I5033 Acute on chronic diastolic (congestive) heart failure: Secondary | ICD-10-CM

## 2015-09-04 DIAGNOSIS — IMO0002 Reserved for concepts with insufficient information to code with codable children: Secondary | ICD-10-CM

## 2015-09-04 DIAGNOSIS — I480 Paroxysmal atrial fibrillation: Principal | ICD-10-CM

## 2015-09-04 DIAGNOSIS — F1110 Opioid abuse, uncomplicated: Secondary | ICD-10-CM

## 2015-09-04 DIAGNOSIS — R739 Hyperglycemia, unspecified: Secondary | ICD-10-CM

## 2015-09-04 DIAGNOSIS — D649 Anemia, unspecified: Secondary | ICD-10-CM | POA: Diagnosis present

## 2015-09-04 DIAGNOSIS — I739 Peripheral vascular disease, unspecified: Secondary | ICD-10-CM

## 2015-09-04 DIAGNOSIS — E1169 Type 2 diabetes mellitus with other specified complication: Secondary | ICD-10-CM | POA: Diagnosis present

## 2015-09-04 DIAGNOSIS — F1210 Cannabis abuse, uncomplicated: Secondary | ICD-10-CM

## 2015-09-04 DIAGNOSIS — I5043 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) and diastolic (congestive) heart failure: Secondary | ICD-10-CM

## 2015-09-04 DIAGNOSIS — Z9862 Peripheral vascular angioplasty status: Secondary | ICD-10-CM

## 2015-09-04 DIAGNOSIS — I4891 Unspecified atrial fibrillation: Secondary | ICD-10-CM | POA: Diagnosis present

## 2015-09-04 DIAGNOSIS — J189 Pneumonia, unspecified organism: Secondary | ICD-10-CM

## 2015-09-04 DIAGNOSIS — Z7901 Long term (current) use of anticoagulants: Secondary | ICD-10-CM

## 2015-09-04 DIAGNOSIS — I110 Hypertensive heart disease with heart failure: Secondary | ICD-10-CM | POA: Diagnosis present

## 2015-09-04 DIAGNOSIS — I429 Cardiomyopathy, unspecified: Secondary | ICD-10-CM

## 2015-09-04 DIAGNOSIS — E785 Hyperlipidemia, unspecified: Secondary | ICD-10-CM | POA: Diagnosis present

## 2015-09-04 DIAGNOSIS — I248 Other forms of acute ischemic heart disease: Secondary | ICD-10-CM

## 2015-09-04 DIAGNOSIS — I255 Ischemic cardiomyopathy: Secondary | ICD-10-CM | POA: Diagnosis present

## 2015-09-04 DIAGNOSIS — I2510 Atherosclerotic heart disease of native coronary artery without angina pectoris: Secondary | ICD-10-CM | POA: Insufficient documentation

## 2015-09-04 DIAGNOSIS — E669 Obesity, unspecified: Secondary | ICD-10-CM | POA: Diagnosis present

## 2015-09-04 DIAGNOSIS — Z794 Long term (current) use of insulin: Secondary | ICD-10-CM

## 2015-09-04 DIAGNOSIS — I2489 Other forms of acute ischemic heart disease: Secondary | ICD-10-CM

## 2015-09-04 DIAGNOSIS — I779 Disorder of arteries and arterioles, unspecified: Secondary | ICD-10-CM

## 2015-09-04 DIAGNOSIS — I5032 Chronic diastolic (congestive) heart failure: Secondary | ICD-10-CM

## 2015-09-04 DIAGNOSIS — E1151 Type 2 diabetes mellitus with diabetic peripheral angiopathy without gangrene: Secondary | ICD-10-CM

## 2015-09-04 DIAGNOSIS — E119 Type 2 diabetes mellitus without complications: Secondary | ICD-10-CM | POA: Diagnosis present

## 2015-09-04 DIAGNOSIS — Z79899 Other long term (current) drug therapy: Secondary | ICD-10-CM

## 2015-09-04 DIAGNOSIS — G4700 Insomnia, unspecified: Secondary | ICD-10-CM

## 2015-09-04 DIAGNOSIS — K7030 Alcoholic cirrhosis of liver without ascites: Secondary | ICD-10-CM | POA: Diagnosis present

## 2015-09-04 DIAGNOSIS — F17200 Nicotine dependence, unspecified, uncomplicated: Secondary | ICD-10-CM | POA: Diagnosis present

## 2015-09-04 DIAGNOSIS — E1140 Type 2 diabetes mellitus with diabetic neuropathy, unspecified: Secondary | ICD-10-CM

## 2015-09-04 DIAGNOSIS — R079 Chest pain, unspecified: Secondary | ICD-10-CM

## 2015-09-04 DIAGNOSIS — M79605 Pain in left leg: Secondary | ICD-10-CM

## 2015-09-04 DIAGNOSIS — K219 Gastro-esophageal reflux disease without esophagitis: Secondary | ICD-10-CM | POA: Diagnosis present

## 2015-09-04 DIAGNOSIS — E1142 Type 2 diabetes mellitus with diabetic polyneuropathy: Secondary | ICD-10-CM | POA: Diagnosis present

## 2015-09-04 DIAGNOSIS — Z6838 Body mass index (BMI) 38.0-38.9, adult: Secondary | ICD-10-CM

## 2015-09-04 DIAGNOSIS — E039 Hypothyroidism, unspecified: Secondary | ICD-10-CM | POA: Diagnosis present

## 2015-09-04 DIAGNOSIS — F1010 Alcohol abuse, uncomplicated: Secondary | ICD-10-CM

## 2015-09-04 DIAGNOSIS — F1411 Cocaine abuse, in remission: Secondary | ICD-10-CM

## 2015-09-04 DIAGNOSIS — Z7982 Long term (current) use of aspirin: Secondary | ICD-10-CM

## 2015-09-04 DIAGNOSIS — Z7902 Long term (current) use of antithrombotics/antiplatelets: Secondary | ICD-10-CM

## 2015-09-04 DIAGNOSIS — E1110 Type 2 diabetes mellitus with ketoacidosis without coma: Secondary | ICD-10-CM

## 2015-09-04 DIAGNOSIS — M79604 Pain in right leg: Secondary | ICD-10-CM

## 2015-09-04 LAB — CBC WITH DIFFERENTIAL/PLATELET
BASOS ABS: 0 10*3/uL (ref 0.0–0.1)
Basophils Relative: 0 %
Eosinophils Absolute: 0.2 10*3/uL (ref 0.0–0.7)
Eosinophils Relative: 2 %
HEMATOCRIT: 33.5 % — AB (ref 36.0–46.0)
Hemoglobin: 11.2 g/dL — ABNORMAL LOW (ref 12.0–15.0)
LYMPHS ABS: 2.7 10*3/uL (ref 0.7–4.0)
LYMPHS PCT: 22 %
MCH: 29 pg (ref 26.0–34.0)
MCHC: 33.4 g/dL (ref 30.0–36.0)
MCV: 86.8 fL (ref 78.0–100.0)
MONOS PCT: 6 %
Monocytes Absolute: 0.7 10*3/uL (ref 0.1–1.0)
Neutro Abs: 8.6 10*3/uL — ABNORMAL HIGH (ref 1.7–7.7)
Neutrophils Relative %: 70 %
PLATELETS: 318 10*3/uL (ref 150–400)
RBC: 3.86 MIL/uL — AB (ref 3.87–5.11)
RDW: 13.2 % (ref 11.5–15.5)
WBC: 12.2 10*3/uL — ABNORMAL HIGH (ref 4.0–10.5)

## 2015-09-04 LAB — BASIC METABOLIC PANEL
Anion gap: 12 (ref 5–15)
BUN: 34 mg/dL — ABNORMAL HIGH (ref 6–20)
CALCIUM: 8.8 mg/dL — AB (ref 8.9–10.3)
CO2: 29 mmol/L (ref 22–32)
CREATININE: 1.27 mg/dL — AB (ref 0.44–1.00)
Chloride: 89 mmol/L — ABNORMAL LOW (ref 101–111)
GFR calc Af Amer: 55 mL/min — ABNORMAL LOW (ref >60)
GFR, EST NON AFRICAN AMERICAN: 48 mL/min — AB (ref >60)
GLUCOSE: 474 mg/dL — AB (ref 65–99)
Potassium: 4.1 mmol/L (ref 3.5–5.1)
Sodium: 130 mmol/L — ABNORMAL LOW (ref 135–145)

## 2015-09-04 LAB — I-STAT TROPONIN, ED: Troponin i, poc: 0.02 ng/mL (ref 0.00–0.08)

## 2015-09-04 MED ORDER — DILTIAZEM LOAD VIA INFUSION
20.0000 mg | Freq: Once | INTRAVENOUS | Status: AC
  Administered 2015-09-04: 20 mg via INTRAVENOUS
  Filled 2015-09-04: qty 20

## 2015-09-04 MED ORDER — SODIUM CHLORIDE 0.9 % IV BOLUS (SEPSIS)
500.0000 mL | Freq: Once | INTRAVENOUS | Status: AC
  Administered 2015-09-04: 500 mL via INTRAVENOUS

## 2015-09-04 MED ORDER — DILTIAZEM HCL 100 MG IV SOLR
5.0000 mg/h | INTRAVENOUS | Status: DC
  Administered 2015-09-04 (×2): 5 mg/h via INTRAVENOUS
  Filled 2015-09-04: qty 100

## 2015-09-04 NOTE — ED Provider Notes (Signed)
CSN: [id number]     Arrival date & time 09/04/15  2130 History   First MD Initiated Contact with Patient 09/04/15 2219     Chief Complaint  Patient presents with  . Chest Pain     (Consider location/radiation/quality/duration/timing/severity/associated sxs/prior Treatment) HPI   [name] is a 54 y.o. female who presents for evaluation of palpitations, chest tightness and right shoulder pain which started shortly after eating her evening meal. She has history of atrial fibrillation, and is currently taking her usual medications including xarelto. She denies recent fever, chills, cough, change in bowel or urinary habits, paresthesias, or local weakness. She states that she is taking her usual sliding scale insulin 3 times a day, and last had a dose that at 7 PM tonight. There are no other known modifying factors.   Past Medical History  Diagnosis Date  . Alcohol abuse   . Narcotic abuse   . Cocaine abuse   . Marijuana abuse   . Tobacco abuse   . Alcoholic cirrhosis ([place])   . Cardiomyopathy ([place])   . Obesity   . Chronic systolic CHF (congestive heart failure) ([place])     a. Last echo 12/205: EF 40-45%, inferoapical/posterior HK, not technically sufficient to allow eval of LV diastolic function, normal LA in size..  . Hypothyroidism   . GERD (gastroesophageal reflux disease)   . PAF (paroxysmal atrial fibrillation) ([place])   . Critical lower limb ischemia   . Hypertension   . CAD (coronary artery disease)     a. cath 11/10/2014 small vessel CAD. Demand ischemia in the setting of rapid a-fib  . Pneumonia     END MARCH 2016  . Anxiety   . Wrist fracture     LEFT; "just did xray and cast"  . Poorly controlled type 2 diabetes mellitus ([place])   . Hyperlipemia   . Peripheral arterial disease ([name])     a. 01/2015 Angio/PTA: LSFA 100 w/ recon @ adductor canal and 1 vessel runoff via AT, RSFA 99 (atherectomy/pta) - 1 vessel runoff via diff dzs peroneal.  . Carotid artery disease ([place])      a. 01/2015 Carotid Angio: RICA 810, LICA 17P. s/p L carotid endarterectomy 02/2015.  [name] COPD (chronic obstructive pulmonary disease) ([place])   . Arthritis     "hands, arms, shoulders, legs, back" (04/29/2015)  . Diabetic peripheral neuropathy [facility])    Past Surgical History  Procedure Laterality Date  . Cardioversion  ~ 02/2013    "twice"   . Lower extremity angiogram N/A 09/10/2013    Procedure: LOWER EXTREMITY ANGIOGRAM;  Surgeon: [name], MD;  Location: [facility] CATH LAB;  Service: Cardiovascular;  Laterality: N/A;  . Left heart catheterization with coronary angiogram N/A 10/31/2014    Procedure: LEFT HEART CATHETERIZATION WITH CORONARY ANGIOGRAM;  Surgeon: Burnell Blanks, MD;  Location: [facility] CATH LAB;  Service: Cardiovascular;  Laterality: N/A;  . Peripheral athrectomy Right 01/15/2015    SFA/notes 01/15/2015  . Balloon angioplasty, artery Right 01/15/2015    SFA/notes 01/15/2015  . Cardiac catheterization    . Lower extremity angiogram N/A 01/15/2015    Procedure: LOWER EXTREMITY ANGIOGRAM;  Surgeon: [name], MD;  Location: [facility] CATH LAB;  Service: Cardiovascular;  Laterality: N/A;  . Carotid angiogram N/A 01/15/2015    Procedure: CAROTID ANGIOGRAM;  Surgeon: [name], MD;  Location: [facility] CATH LAB;  Service: Cardiovascular;  Laterality: N/A;  . Endarterectomy Left 02/19/2015    Procedure: LEFT CAROTID ENDARTERECTOMY ;  Surgeon: Serafina Mitchell, MD;  Location: [facility] OR;  Service: Vascular;  Laterality: Left;  . Dilation and curettage of uterus  1988   Family History  Problem Relation Age of Onset  . Hypertension Mother   . Diabetes Mother   . Cancer Mother     breast, ovarian, colon  . Clotting disorder Mother   . Heart disease Mother   . Heart attack Mother   . Hypertension Father   . Heart disease Father   . Emphysema Sister     smoked   Social History  Substance Use Topics  . Smoking status: Current Every Day Smoker -- 0.00 packs/day for 40 years    Types:  E-cigarettes    Last Attempt to Quit: 02/19/2015  . Smokeless tobacco: Never Used  . Alcohol Use: No   OB History    Gravida Para Term Preterm AB TAB SAB Ectopic Multiple Living   '1 1 1       1     ' Review of Systems  All other systems reviewed and are negative.     Allergies  Amiodarone  Home Medications   Prior to Admission medications   Medication Sig Start Date End Date Taking? Authorizing Provider  albuterol (PROVENTIL HFA;VENTOLIN HFA) 108 (90 BASE) MCG/ACT inhaler Inhale 2 puffs into the lungs every 6 (six) hours as needed for wheezing or shortness of breath. 06/30/15  Yes Arnoldo Morale, MD  albuterol (PROVENTIL) (2.5 MG/3ML) 0.083% nebulizer solution Take 3 mLs (2.5 mg total) by nebulization every 6 (six) hours as needed. For wheezing. 06/30/15  Yes Arnoldo Morale, MD  atorvastatin (LIPITOR) 40 MG tablet Take 1 tablet (40 mg total) by mouth daily. 06/30/15  Yes Arnoldo Morale, MD  beta carotene w/minerals (OCUVITE) tablet Take 1 tablet by mouth daily.   Yes Historical Provider, MD  Blood Glucose Monitoring Suppl (ACCU-CHEK AVIVA PLUS) W/DEVICE KIT 1 Device by Does not apply route 4 (four) times daily -  before meals and at bedtime. 07/21/15  Yes Arnoldo Morale, MD  carvedilol (COREG) 25 MG tablet Take 1 tablet (25 mg total) by mouth 2 (two) times daily with a meal. 06/30/15  Yes Arnoldo Morale, MD  Cholecalciferol (VITAMIN D PO) Take 1 capsule by mouth daily.   Yes Historical Provider, MD  clopidogrel (PLAVIX) 75 MG tablet Take 1 tablet (75 mg total) by mouth daily with breakfast. 06/30/15  Yes Arnoldo Morale, MD  cyclobenzaprine (FLEXERIL) 10 MG tablet Take 1 tablet (10 mg total) by mouth 3 (three) times daily as needed for muscle spasms. 07/21/15  Yes Arnoldo Morale, MD  furosemide (LASIX) 40 MG tablet Take 1.5 tablets (60 mg total) by mouth 2 (two) times daily. 07/14/15  Yes Arnoldo Morale, MD  Gabapentin, Once-Daily, (GRALISE) 300 MG TABS Take 1 tablet by mouth 4 (four) times daily.   Yes  Historical Provider, MD  glipiZIDE (GLUCOTROL XL) 10 MG 24 hr tablet Take 2 tablets (20 mg total) by mouth daily with breakfast. 06/30/15  Yes Arnoldo Morale, MD  insulin aspart (NOVOLOG) 100 UNIT/ML FlexPen Sliding scale Patient taking differently: Inject 2-8 Units into the skin 3 (three) times daily with meals. Sliding scale 06/30/15  Yes Arnoldo Morale, MD  Insulin Glargine (LANTUS) 100 UNIT/ML Solostar Pen Inject 60 units subcutaneous hs 07/21/15  Yes Arnoldo Morale, MD  levothyroxine (SYNTHROID, LEVOTHROID) 50 MCG tablet Take 1 tablet (50 mcg total) by mouth daily. 06/30/15  Yes Arnoldo Morale, MD  lisinopril (PRINIVIL,ZESTRIL) 20 MG tablet Take 1 tablet (20  mg total) by mouth daily. 06/30/15  Yes Arnoldo Morale, MD  nitrofurantoin, macrocrystal-monohydrate, (MACROBID) 100 MG capsule Take 1 capsule (100 mg total) by mouth 2 (two) times daily. 07/14/15  Yes Arnoldo Morale, MD  nitroGLYCERIN (NITROSTAT) 0.4 MG SL tablet Place 1 tablet (0.4 mg total) under the tongue every 5 (five) minutes as needed for chest pain. 01/16/15  Yes Rogelia Mire, NP  pantoprazole (PROTONIX) 40 MG tablet Take 1 tablet (40 mg total) by mouth daily. 06/30/15 06/29/16 Yes Arnoldo Morale, MD  rivaroxaban (XARELTO) 20 MG TABS tablet Take 1 tablet (20 mg total) by mouth daily with supper. 06/30/15  Yes Arnoldo Morale, MD  acetaminophen-codeine (TYLENOL #3) 300-30 MG per tablet Take 1 tablet by mouth every 6 (six) hours as needed for moderate pain. Patient not taking: Reported on 09/04/2015 07/14/15   Arnoldo Morale, MD  gabapentin (NEURONTIN) 400 MG capsule Take 1 capsule (400 mg total) by mouth 4 (four) times daily. Patient not taking: Reported on 09/04/2015 07/14/15   Arnoldo Morale, MD  glucose blood (ACCU-CHEK AVIVA PLUS) test strip Use as instructed 07/21/15   Arnoldo Morale, MD  Insulin Pen Needle (ULTICARE MICRO PEN NEEDLES) 32G X 4 MM MISC 1 Syringe by Does not apply route 4 (four) times daily - after meals and at bedtime. 06/30/15   Arnoldo Morale,  MD  Lancets (ACCU-CHEK SOFT TOUCH) lancets Use as instructed 07/21/15   Arnoldo Morale, MD   BP 82/58 mmHg  Pulse 129  Temp(Src) 98.8 F (37.1 C) (Oral)  Resp 14  Ht '5\' 4"'  (1.626 m)  Wt 225 lb (102.059 kg)  BMI 38.60 kg/m2  SpO2 97%  LMP 11/27/2012 Physical Exam  Constitutional: She is oriented to person, place, and time. She appears well-developed and well-nourished.  HENT:  Head: Normocephalic and atraumatic.  Right Ear: External ear normal.  Left Ear: External ear normal.  Eyes: Conjunctivae and EOM are normal. Pupils are equal, round, and reactive to light.  Neck: Normal range of motion and phonation normal. Neck supple.  Cardiovascular: Normal heart sounds.   No murmur heard. Irregular tachycardia  Pulmonary/Chest: Effort normal and breath sounds normal. No respiratory distress. She has no wheezes. She exhibits no bony tenderness.  Abdominal: Soft. There is no tenderness.  Musculoskeletal: Normal range of motion. She exhibits edema (trace lower extremities bilaterally).  Neurological: She is alert and oriented to person, place, and time. No cranial nerve deficit or sensory deficit. She exhibits normal muscle tone. Coordination normal.  Skin: Skin is warm, dry and intact.  Psychiatric: She has a normal mood and affect. Her behavior is normal. Judgment and thought content normal.  Nursing note and vitals reviewed.   ED Course  Procedures (including critical care time)  Medications  diltiazem (CARDIZEM) 1 mg/mL load via infusion 20 mg (20 mg Intravenous Given 09/04/15 2240)    And  diltiazem (CARDIZEM) 100 mg in dextrose 5 % 100 mL (1 mg/mL) infusion (0 mg/hr Intravenous Paused 09/04/15 2314)  sodium chloride 0.9 % bolus 500 mL (0 mLs Intravenous Stopped 09/04/15 2311)  sodium chloride 0.9 % bolus 500 mL (500 mLs Intravenous New Bag/Given 09/04/15 2312)    Patient Vitals for the past 24 hrs:  BP Temp Temp src Pulse Resp SpO2 Height Weight  09/04/15 2308 (!) 82/58 mmHg - -  (!) 129 14 97 % - -  09/04/15 2300 91/68 mmHg - - (!) 40 15 95 % - -  09/04/15 2250 - - - 61 15 96 % - -  09/04/15 2245 101/59 mmHg - - (!) 134 20 97 % - -  09/04/15 2240 - - - 93 18 96 % - -  09/04/15 2230 133/55 mmHg - - 111 15 97 % - -  09/04/15 2215 119/98 mmHg - - 72 16 96 % - -  09/04/15 2200 120/69 mmHg - - 68 13 95 % - -  09/04/15 2145 106/69 mmHg - - (!) 136 11 96 % - -  09/04/15 2144 108/63 mmHg 98.8 F (37.1 C) Oral (!) 140 16 95 % - -  09/04/15 2142 - - - - - - '5\' 4"'  (1.626 m) 225 lb (102.059 kg)    11:21 PM Reevaluation with update and discussion. After initial assessment and treatment, an updated evaluation reveals hypotensive response to Cardizem. Drip stopped and additional IV fluids given. Will consider treatment with Lopressor. Keymari Sato L    12:22 AM-Consult complete with Hospitalist. Patient case explained and discussed. He agrees to admit patient for further evaluation and treatment. Call ended at 1228 AM  CRITICAL CARE Performed by: Daleen Bo L Total critical care time: 50 minutes Critical care time was exclusive of separately billable procedures and treating other patients. Critical care was necessary to treat or prevent imminent or life-threatening deterioration. Critical care was time spent personally by me on the following activities: development of treatment plan with patient and/or surrogate as well as nursing, discussions with consultants, evaluation of patient's response to treatment, examination of patient, obtaining history from patient or surrogate, ordering and performing treatments and interventions, ordering and review of laboratory studies, ordering and review of radiographic studies, pulse oximetry and re-evaluation of patient's condition.    Labs Review Labs Reviewed  BASIC METABOLIC PANEL - Abnormal; Notable for the following:    Sodium 130 (*)    Chloride 89 (*)    Glucose, Bld 474 (*)    BUN 34 (*)    Creatinine, Ser 1.27 (*)     Calcium 8.8 (*)    GFR calc non Af Amer 48 (*)    GFR calc Af Amer 55 (*)    All other components within normal limits  CBC WITH DIFFERENTIAL/PLATELET - Abnormal; Notable for the following:    WBC 12.2 (*)    RBC 3.86 (*)    Hemoglobin 11.2 (*)    HCT 33.5 (*)    Neutro Abs 8.6 (*)    All other components within normal limits  URINE CULTURE  URINALYSIS, ROUTINE W REFLEX MICROSCOPIC (NOT AT [facility])  I-STAT TROPOININ, ED   BUN  Date Value Ref Range Status  09/04/2015 34* 6 - 20 mg/dL Final  07/21/2015 31* 7 - 25 mg/dL Final  07/04/2015 33* 6 - 20 mg/dL Final  06/24/2015 27* 6 - 20 mg/dL Final   CREAT  Date Value Ref Range Status  07/21/2015 0.95 0.50 - 1.05 mg/dL Final  01/08/2015 0.85 0.50 - 1.10 mg/dL Final  09/06/2013 0.90 0.50 - 1.10 mg/dL Final  07/26/2013 0.74 0.50 - 1.10 mg/dL Final   CREATININE, SER  Date Value Ref Range Status  09/04/2015 1.27* 0.44 - 1.00 mg/dL Final  07/04/2015 1.15* 0.44 - 1.00 mg/dL Final  06/24/2015 0.99 0.44 - 1.00 mg/dL Final  06/23/2015 1.00 0.44 - 1.00 mg/dL Final   GLUCOSE, BLD  Date Value Ref Range Status  09/04/2015 474* 65 - 99 mg/dL Final  07/21/2015 439* 65 - 99 mg/dL Final  07/04/2015 353* 65 - 99 mg/dL Final  06/24/2015 405* 65 - 99 mg/dL Final  Imaging Review Dg Chest Port 1 View  09/04/2015  CLINICAL DATA:  Chest pain and palpitations. EXAM: PORTABLE CHEST 1 VIEW COMPARISON:  Frontal and lateral views 06/23/2015, chest CT 07/04/2015 FINDINGS: The cardiomediastinal contours are unchanged with heart at the upper limits of normal in size in atherosclerosis of the thoracic aorta. Pulmonary vasculature is normal. No consolidation, pleural effusion, or pneumothorax. No acute osseous abnormalities are seen. IMPRESSION: Borderline cardiomegaly, unchanged.  No acute pulmonary process. Electronically Signed   By: Jeb Levering M.D.   On: 09/04/2015 23:03   I have personally reviewed and evaluated these images and lab results as  part of my medical decision-making.   EKG Interpretation None      MDM   Final diagnoses:  Atrial fibrillation with RVR ([name])  Hyperglycemia    Recurrent atrial fibrillation with history of paroxysmal afibrillation, now with rapid ventricular response. Doubt ACS, serious bacterial infection or metabolic instability. Anion gap is normal. Creatinine is marginally elevated above baseline. Glucose is chronically elevated. Suspect noncompliance with diet.  Nursing Notes Reviewed/ Care Coordinated, and agree without changes. Applicable Imaging Reviewed.  Interpretation of Laboratory Data incorporated into ED treatment   Plan: Admit   Daleen Bo, MD 09/05/15 [phone]

## 2015-09-04 NOTE — ED Notes (Signed)
Pt c/o chest pain mid chest that radiates to neck and shoulders starting at 2030 tonight.  Pt took 1 nitro that took pain from 10 to 8/10.  [facility] states Pt Hr 192 and Afib on monitor.  Gave 20mg  cardizem that brought HR to 136 AFIB.  Gave pt 1 nitro that took pain to 5/10.  Gave 324 asp.  CBG 585.  20 gauge r hand.

## 2015-09-05 ENCOUNTER — Encounter (HOSPITAL_COMMUNITY): Payer: Self-pay | Admitting: Certified Registered Nurse Anesthetist

## 2015-09-05 DIAGNOSIS — J449 Chronic obstructive pulmonary disease, unspecified: Secondary | ICD-10-CM | POA: Diagnosis present

## 2015-09-05 DIAGNOSIS — D649 Anemia, unspecified: Secondary | ICD-10-CM | POA: Diagnosis present

## 2015-09-05 DIAGNOSIS — I110 Hypertensive heart disease with heart failure: Secondary | ICD-10-CM | POA: Diagnosis present

## 2015-09-05 DIAGNOSIS — Z7902 Long term (current) use of antithrombotics/antiplatelets: Secondary | ICD-10-CM | POA: Diagnosis not present

## 2015-09-05 DIAGNOSIS — J438 Other emphysema: Secondary | ICD-10-CM | POA: Diagnosis not present

## 2015-09-05 DIAGNOSIS — I5022 Chronic systolic (congestive) heart failure: Secondary | ICD-10-CM

## 2015-09-05 DIAGNOSIS — E1151 Type 2 diabetes mellitus with diabetic peripheral angiopathy without gangrene: Secondary | ICD-10-CM

## 2015-09-05 DIAGNOSIS — E785 Hyperlipidemia, unspecified: Secondary | ICD-10-CM | POA: Diagnosis present

## 2015-09-05 DIAGNOSIS — E1165 Type 2 diabetes mellitus with hyperglycemia: Secondary | ICD-10-CM | POA: Diagnosis present

## 2015-09-05 DIAGNOSIS — E669 Obesity, unspecified: Secondary | ICD-10-CM | POA: Diagnosis present

## 2015-09-05 DIAGNOSIS — M5430 Sciatica, unspecified side: Secondary | ICD-10-CM | POA: Diagnosis present

## 2015-09-05 DIAGNOSIS — I739 Peripheral vascular disease, unspecified: Secondary | ICD-10-CM | POA: Diagnosis present

## 2015-09-05 DIAGNOSIS — F17200 Nicotine dependence, unspecified, uncomplicated: Secondary | ICD-10-CM | POA: Diagnosis present

## 2015-09-05 DIAGNOSIS — I2510 Atherosclerotic heart disease of native coronary artery without angina pectoris: Secondary | ICD-10-CM | POA: Diagnosis present

## 2015-09-05 DIAGNOSIS — I248 Other forms of acute ischemic heart disease: Secondary | ICD-10-CM | POA: Diagnosis present

## 2015-09-05 DIAGNOSIS — F17210 Nicotine dependence, cigarettes, uncomplicated: Secondary | ICD-10-CM | POA: Diagnosis present

## 2015-09-05 DIAGNOSIS — E860 Dehydration: Secondary | ICD-10-CM | POA: Diagnosis present

## 2015-09-05 DIAGNOSIS — I25118 Atherosclerotic heart disease of native coronary artery with other forms of angina pectoris: Secondary | ICD-10-CM | POA: Diagnosis not present

## 2015-09-05 DIAGNOSIS — Z794 Long term (current) use of insulin: Secondary | ICD-10-CM | POA: Diagnosis not present

## 2015-09-05 DIAGNOSIS — Z6838 Body mass index (BMI) 38.0-38.9, adult: Secondary | ICD-10-CM | POA: Diagnosis not present

## 2015-09-05 DIAGNOSIS — I480 Paroxysmal atrial fibrillation: Secondary | ICD-10-CM | POA: Diagnosis not present

## 2015-09-05 DIAGNOSIS — I5032 Chronic diastolic (congestive) heart failure: Secondary | ICD-10-CM | POA: Diagnosis present

## 2015-09-05 DIAGNOSIS — Z7901 Long term (current) use of anticoagulants: Secondary | ICD-10-CM | POA: Diagnosis not present

## 2015-09-05 DIAGNOSIS — E1142 Type 2 diabetes mellitus with diabetic polyneuropathy: Secondary | ICD-10-CM | POA: Diagnosis present

## 2015-09-05 DIAGNOSIS — E039 Hypothyroidism, unspecified: Secondary | ICD-10-CM | POA: Diagnosis present

## 2015-09-05 DIAGNOSIS — Z79899 Other long term (current) drug therapy: Secondary | ICD-10-CM | POA: Diagnosis not present

## 2015-09-05 DIAGNOSIS — K219 Gastro-esophageal reflux disease without esophagitis: Secondary | ICD-10-CM | POA: Diagnosis present

## 2015-09-05 DIAGNOSIS — R079 Chest pain, unspecified: Secondary | ICD-10-CM | POA: Diagnosis not present

## 2015-09-05 DIAGNOSIS — I4891 Unspecified atrial fibrillation: Secondary | ICD-10-CM | POA: Diagnosis present

## 2015-09-05 DIAGNOSIS — I255 Ischemic cardiomyopathy: Secondary | ICD-10-CM | POA: Diagnosis present

## 2015-09-05 DIAGNOSIS — K7030 Alcoholic cirrhosis of liver without ascites: Secondary | ICD-10-CM | POA: Diagnosis present

## 2015-09-05 DIAGNOSIS — Z7982 Long term (current) use of aspirin: Secondary | ICD-10-CM | POA: Diagnosis not present

## 2015-09-05 LAB — BASIC METABOLIC PANEL
ANION GAP: 12 (ref 5–15)
BUN: 38 mg/dL — ABNORMAL HIGH (ref 6–20)
CALCIUM: 8.5 mg/dL — AB (ref 8.9–10.3)
CO2: 25 mmol/L (ref 22–32)
CREATININE: 1.23 mg/dL — AB (ref 0.44–1.00)
Chloride: 97 mmol/L — ABNORMAL LOW (ref 101–111)
GFR, EST AFRICAN AMERICAN: 57 mL/min — AB (ref >60)
GFR, EST NON AFRICAN AMERICAN: 50 mL/min — AB (ref >60)
GLUCOSE: 416 mg/dL — AB (ref 65–99)
Potassium: 4.4 mmol/L (ref 3.5–5.1)
Sodium: 134 mmol/L — ABNORMAL LOW (ref 135–145)

## 2015-09-05 LAB — RAPID URINE DRUG SCREEN, HOSP PERFORMED
AMPHETAMINES: NOT DETECTED
BENZODIAZEPINES: NOT DETECTED
Barbiturates: NOT DETECTED
Cocaine: NOT DETECTED
Opiates: NOT DETECTED
TETRAHYDROCANNABINOL: NOT DETECTED

## 2015-09-05 LAB — GLUCOSE, CAPILLARY
GLUCOSE-CAPILLARY: 393 mg/dL — AB (ref 65–99)
GLUCOSE-CAPILLARY: 300 mg/dL — AB (ref 65–99)
GLUCOSE-CAPILLARY: 213 mg/dL — AB (ref 65–99)
GLUCOSE-CAPILLARY: 173 mg/dL — AB (ref 65–99)
GLUCOSE-CAPILLARY: 334 mg/dL — AB (ref 65–99)
Glucose-Capillary: 337 mg/dL — ABNORMAL HIGH (ref 65–99)
Glucose-Capillary: 349 mg/dL — ABNORMAL HIGH (ref 65–99)
Glucose-Capillary: 238 mg/dL — ABNORMAL HIGH (ref 65–99)
Glucose-Capillary: 233 mg/dL — ABNORMAL HIGH (ref 65–99)
Glucose-Capillary: 279 mg/dL — ABNORMAL HIGH (ref 65–99)
Glucose-Capillary: 225 mg/dL — ABNORMAL HIGH (ref 65–99)
Glucose-Capillary: 329 mg/dL — ABNORMAL HIGH (ref 65–99)

## 2015-09-05 LAB — URINALYSIS, ROUTINE W REFLEX MICROSCOPIC
Bilirubin Urine: NEGATIVE
HGB URINE DIPSTICK: NEGATIVE
Ketones, ur: NEGATIVE mg/dL
LEUKOCYTES UA: NEGATIVE
Nitrite: NEGATIVE
PH: 5.5 (ref 5.0–8.0)
Protein, ur: NEGATIVE mg/dL
SPECIFIC GRAVITY, URINE: 1.014 (ref 1.005–1.030)
Urobilinogen, UA: 0.2 mg/dL (ref 0.0–1.0)

## 2015-09-05 LAB — CBC
HEMATOCRIT: 32.4 % — AB (ref 36.0–46.0)
Hemoglobin: 10.4 g/dL — ABNORMAL LOW (ref 12.0–15.0)
MCH: 28 pg (ref 26.0–34.0)
MCHC: 32.1 g/dL (ref 30.0–36.0)
MCV: 87.3 fL (ref 78.0–100.0)
PLATELETS: 316 10*3/uL (ref 150–400)
RBC: 3.71 MIL/uL — ABNORMAL LOW (ref 3.87–5.11)
RDW: 13.3 % (ref 11.5–15.5)
WBC: 11.9 10*3/uL — AB (ref 4.0–10.5)

## 2015-09-05 LAB — TROPONIN I
TROPONIN I: 0.29 ng/mL — AB (ref <0.031)
Troponin I: 0.07 ng/mL — ABNORMAL HIGH (ref <0.031)
Troponin I: 0.31 ng/mL — ABNORMAL HIGH (ref <0.031)

## 2015-09-05 LAB — TSH: TSH: 1.719 u[IU]/mL (ref 0.350–4.500)

## 2015-09-05 LAB — URINE MICROSCOPIC-ADD ON

## 2015-09-05 LAB — MRSA PCR SCREENING: MRSA by PCR: NEGATIVE

## 2015-09-05 MED ORDER — SODIUM CHLORIDE 0.9 % IJ SOLN
3.0000 mL | Freq: Two times a day (BID) | INTRAMUSCULAR | Status: DC
  Administered 2015-09-05 – 2015-09-07 (×5): 3 mL via INTRAVENOUS

## 2015-09-05 MED ORDER — GABAPENTIN (ONCE-DAILY) 300 MG PO TABS
300.0000 mg | ORAL_TABLET | Freq: Four times a day (QID) | ORAL | Status: DC
  Administered 2015-09-05 – 2015-09-07 (×9): 300 mg via ORAL
  Filled 2015-09-05: qty 1

## 2015-09-05 MED ORDER — ALBUTEROL SULFATE (2.5 MG/3ML) 0.083% IN NEBU: 2.5000 mg | INHALATION_SOLUTION | Freq: Four times a day (QID) | RESPIRATORY_TRACT | Status: DC | PRN

## 2015-09-05 MED ORDER — CYCLOBENZAPRINE HCL 10 MG PO TABS
10.0000 mg | ORAL_TABLET | Freq: Three times a day (TID) | ORAL | Status: DC | PRN
  Administered 2015-09-05 – 2015-09-07 (×7): 10 mg via ORAL
  Filled 2015-09-05 (×7): qty 1

## 2015-09-05 MED ORDER — INSULIN REGULAR BOLUS VIA INFUSION
0.0000 [IU] | Freq: Three times a day (TID) | INTRAVENOUS | Status: DC
  Administered 2015-09-05: 6.4 [IU] via INTRAVENOUS
  Filled 2015-09-05: qty 10

## 2015-09-05 MED ORDER — HEPARIN SODIUM (PORCINE) 5000 UNIT/ML IJ SOLN: 5000.0000 [IU] | Freq: Three times a day (TID) | INTRAMUSCULAR | Status: DC

## 2015-09-05 MED ORDER — DEXTROSE 50 % IV SOLN: 25.0000 mL | INTRAVENOUS | Status: DC | PRN

## 2015-09-05 MED ORDER — DEXTROSE-NACL 5-0.45 % IV SOLN
INTRAVENOUS | Status: DC
  Administered 2015-09-05: 06:00:00 via INTRAVENOUS

## 2015-09-05 MED ORDER — SODIUM CHLORIDE 0.9 % IV SOLN: INTRAVENOUS | Status: DC

## 2015-09-05 MED ORDER — PANTOPRAZOLE SODIUM 40 MG PO TBEC
40.0000 mg | DELAYED_RELEASE_TABLET | Freq: Every day | ORAL | Status: DC
  Administered 2015-09-05 – 2015-09-07 (×3): 40 mg via ORAL
  Filled 2015-09-05 (×3): qty 1

## 2015-09-05 MED ORDER — ALBUTEROL SULFATE HFA 108 (90 BASE) MCG/ACT IN AERS: 2.0000 | INHALATION_SPRAY | Freq: Four times a day (QID) | RESPIRATORY_TRACT | Status: DC | PRN

## 2015-09-05 MED ORDER — CLOPIDOGREL BISULFATE 75 MG PO TABS
75.0000 mg | ORAL_TABLET | Freq: Every day | ORAL | Status: DC
  Administered 2015-09-05 – 2015-09-07 (×3): 75 mg via ORAL
  Filled 2015-09-05 (×3): qty 1

## 2015-09-05 MED ORDER — LEVOTHYROXINE SODIUM 50 MCG PO TABS
50.0000 ug | ORAL_TABLET | Freq: Every day | ORAL | Status: DC
  Administered 2015-09-05 – 2015-09-07 (×3): 50 ug via ORAL
  Filled 2015-09-05 (×3): qty 1

## 2015-09-05 MED ORDER — INSULIN ASPART 100 UNIT/ML ~~LOC~~ SOLN
0.0000 [IU] | Freq: Three times a day (TID) | SUBCUTANEOUS | Status: DC
  Administered 2015-09-05: 11 [IU] via SUBCUTANEOUS
  Administered 2015-09-05: 3 [IU] via SUBCUTANEOUS
  Administered 2015-09-06 (×2): 15 [IU] via SUBCUTANEOUS
  Administered 2015-09-06 – 2015-09-07 (×2): 8 [IU] via SUBCUTANEOUS
  Administered 2015-09-07: 11 [IU] via SUBCUTANEOUS

## 2015-09-05 MED ORDER — SODIUM CHLORIDE 0.9 % IV SOLN
INTRAVENOUS | Status: DC
  Administered 2015-09-05: 03:00:00 via INTRAVENOUS

## 2015-09-05 MED ORDER — CARVEDILOL 25 MG PO TABS
25.0000 mg | ORAL_TABLET | Freq: Two times a day (BID) | ORAL | Status: DC
  Administered 2015-09-05: 25 mg via ORAL
  Filled 2015-09-05: qty 1

## 2015-09-05 MED ORDER — SODIUM CHLORIDE 0.9 % IV SOLN
INTRAVENOUS | Status: DC
  Administered 2015-09-05: 02:00:00 via INTRAVENOUS

## 2015-09-05 MED ORDER — CARVEDILOL 25 MG PO TABS
37.5000 mg | ORAL_TABLET | Freq: Two times a day (BID) | ORAL | Status: DC
  Administered 2015-09-05 – 2015-09-07 (×4): 37.5 mg via ORAL
  Filled 2015-09-05 (×8): qty 1

## 2015-09-05 MED ORDER — ATORVASTATIN CALCIUM 40 MG PO TABS
40.0000 mg | ORAL_TABLET | Freq: Every day | ORAL | Status: DC
  Administered 2015-09-05 – 2015-09-07 (×3): 40 mg via ORAL
  Filled 2015-09-05 (×3): qty 1

## 2015-09-05 MED ORDER — INSULIN ASPART 100 UNIT/ML ~~LOC~~ SOLN
0.0000 [IU] | Freq: Every day | SUBCUTANEOUS | Status: DC
  Administered 2015-09-05: 4 [IU] via SUBCUTANEOUS
  Administered 2015-09-06: 3 [IU] via SUBCUTANEOUS

## 2015-09-05 MED ORDER — SODIUM CHLORIDE 0.9 % IV SOLN
INTRAVENOUS | Status: DC
  Administered 2015-09-05: 3.3 [IU]/h via INTRAVENOUS
  Filled 2015-09-05: qty 2.5

## 2015-09-05 MED ORDER — RIVAROXABAN 20 MG PO TABS
20.0000 mg | ORAL_TABLET | Freq: Every day | ORAL | Status: DC
  Administered 2015-09-05 – 2015-09-06 (×2): 20 mg via ORAL
  Filled 2015-09-05 (×2): qty 1

## 2015-09-05 MED ORDER — INSULIN GLARGINE 100 UNIT/ML ~~LOC~~ SOLN
60.0000 [IU] | Freq: Every morning | SUBCUTANEOUS | Status: DC
  Administered 2015-09-05 – 2015-09-06 (×2): 60 [IU] via SUBCUTANEOUS
  Filled 2015-09-05 (×3): qty 0.6

## 2015-09-05 NOTE — H&P (Addendum)
[facility] History and Physical  [name] UUV:[id number] DOB: 07-Mar-1961 DOA: 09/04/2015  Referring physician: EDP PCP: Kevan Ny, MD   Chief Complaint: Chest pain   HPI: [name] is a 54 y.o. female with uncontrolled DM, h/o paroxysmal A.fib on Xeralto.  Patient presents to the ED with c/o palpitations, chest tightness, R shoulder pain.  Symptoms onset this evening after eating dinner.  She has taken all of her usual meds today.  No recent illness.  Per EMS initially HR A.Fib in the 190s, improved slightly with cardizem.  Patient now going 140s in ED on cardizem gtt, chest pain has improved with rate control.  Review of Systems: Systems reviewed.  As above, otherwise negative  Past Medical History  Diagnosis Date  . Alcohol abuse   . Narcotic abuse   . Cocaine abuse   . Marijuana abuse   . Tobacco abuse   . Alcoholic cirrhosis ([place])   . Cardiomyopathy ([place])   . Obesity   . Chronic systolic CHF (congestive heart failure) ([place])     a. Last echo 12/205: EF 40-45%, inferoapical/posterior HK, not technically sufficient to allow eval of LV diastolic function, normal LA in size..  . Hypothyroidism   . GERD (gastroesophageal reflux disease)   . PAF (paroxysmal atrial fibrillation) ([place])   . Critical lower limb ischemia   . Hypertension   . CAD (coronary artery disease)     a. cath 11/10/2014 small vessel CAD. Demand ischemia in the setting of rapid a-fib  . Pneumonia     END MARCH 2016  . Anxiety   . Wrist fracture     LEFT; "just did xray and cast"  . Poorly controlled type 2 diabetes mellitus ([place])   . Hyperlipemia   . Peripheral arterial disease ([place])     a. 01/2015 Angio/PTA: LSFA 100 w/ recon @ adductor canal and 1 vessel runoff via AT, RSFA 99 (atherectomy/pta) - 1 vessel runoff via diff dzs peroneal.  . Carotid artery disease ([place])     a. 01/2015 Carotid Angio: RICA 474, LICA 25Z. s/p L carotid endarterectomy 02/2015.  [name] COPD (chronic obstructive  pulmonary disease) ([place])   . Arthritis     "hands, arms, shoulders, legs, back" (04/29/2015)  . Diabetic peripheral neuropathy [facility])    Past Surgical History  Procedure Laterality Date  . Cardioversion  ~ 02/2013    "twice"   . Lower extremity angiogram N/A 09/10/2013    Procedure: LOWER EXTREMITY ANGIOGRAM;  Surgeon: [name], MD;  Location: [facility] CATH LAB;  Service: Cardiovascular;  Laterality: N/A;  . Left heart catheterization with coronary angiogram N/A 10/31/2014    Procedure: LEFT HEART CATHETERIZATION WITH CORONARY ANGIOGRAM;  Surgeon: Burnell Blanks, MD;  Location: [facility] CATH LAB;  Service: Cardiovascular;  Laterality: N/A;  . Peripheral athrectomy Right 01/15/2015    SFA/notes 01/15/2015  . Balloon angioplasty, artery Right 01/15/2015    SFA/notes 01/15/2015  . Cardiac catheterization    . Lower extremity angiogram N/A 01/15/2015    Procedure: LOWER EXTREMITY ANGIOGRAM;  Surgeon: [name], MD;  Location: [facility] CATH LAB;  Service: Cardiovascular;  Laterality: N/A;  . Carotid angiogram N/A 01/15/2015    Procedure: CAROTID ANGIOGRAM;  Surgeon: [name], MD;  Location: [facility] CATH LAB;  Service: Cardiovascular;  Laterality: N/A;  . Endarterectomy Left 02/19/2015    Procedure: LEFT CAROTID ENDARTERECTOMY ;  Surgeon: Serafina Mitchell, MD;  Location: [place];  Service: Vascular;  Laterality: Left;  .  Dilation and curettage of uterus  1988   Social History:  reports that she has been smoking E-cigarettes.  She has been smoking about 0.00 packs per day for the past 40 years. She has never used smokeless tobacco. She reports that she does not drink alcohol or use illicit drugs.  Allergies  Allergen Reactions  . Amiodarone Nausea And Vomiting    Family History  Problem Relation Age of Onset  . Hypertension Mother   . Diabetes Mother   . Cancer Mother     breast, ovarian, colon  . Clotting disorder Mother   . Heart disease Mother   . Heart attack Mother   . Hypertension Father    . Heart disease Father   . Emphysema Sister     smoked     Prior to Admission medications   Medication Sig Start Date End Date Taking? Authorizing Provider  albuterol (PROVENTIL HFA;VENTOLIN HFA) 108 (90 BASE) MCG/ACT inhaler Inhale 2 puffs into the lungs every 6 (six) hours as needed for wheezing or shortness of breath. 06/30/15  Yes Arnoldo Morale, MD  albuterol (PROVENTIL) (2.5 MG/3ML) 0.083% nebulizer solution Take 3 mLs (2.5 mg total) by nebulization every 6 (six) hours as needed. For wheezing. 06/30/15  Yes Arnoldo Morale, MD  atorvastatin (LIPITOR) 40 MG tablet Take 1 tablet (40 mg total) by mouth daily. 06/30/15  Yes Arnoldo Morale, MD  beta carotene w/minerals (OCUVITE) tablet Take 1 tablet by mouth daily.   Yes Historical Provider, MD  Blood Glucose Monitoring Suppl (ACCU-CHEK AVIVA PLUS) W/DEVICE KIT 1 Device by Does not apply route 4 (four) times daily -  before meals and at bedtime. 07/21/15  Yes Arnoldo Morale, MD  carvedilol (COREG) 25 MG tablet Take 1 tablet (25 mg total) by mouth 2 (two) times daily with a meal. 06/30/15  Yes Arnoldo Morale, MD  Cholecalciferol (VITAMIN D PO) Take 1 capsule by mouth daily.   Yes Historical Provider, MD  clopidogrel (PLAVIX) 75 MG tablet Take 1 tablet (75 mg total) by mouth daily with breakfast. 06/30/15  Yes Arnoldo Morale, MD  cyclobenzaprine (FLEXERIL) 10 MG tablet Take 1 tablet (10 mg total) by mouth 3 (three) times daily as needed for muscle spasms. 07/21/15  Yes Arnoldo Morale, MD  furosemide (LASIX) 40 MG tablet Take 1.5 tablets (60 mg total) by mouth 2 (two) times daily. 07/14/15  Yes Arnoldo Morale, MD  Gabapentin, Once-Daily, (GRALISE) 300 MG TABS Take 1 tablet by mouth 4 (four) times daily.   Yes Historical Provider, MD  glipiZIDE (GLUCOTROL XL) 10 MG 24 hr tablet Take 2 tablets (20 mg total) by mouth daily with breakfast. 06/30/15  Yes Arnoldo Morale, MD  insulin aspart (NOVOLOG) 100 UNIT/ML FlexPen Sliding scale Patient taking differently: Inject 2-8 Units  into the skin 3 (three) times daily with meals. Sliding scale 06/30/15  Yes Arnoldo Morale, MD  Insulin Glargine (LANTUS) 100 UNIT/ML Solostar Pen Inject 60 units subcutaneous hs 07/21/15  Yes Arnoldo Morale, MD  levothyroxine (SYNTHROID, LEVOTHROID) 50 MCG tablet Take 1 tablet (50 mcg total) by mouth daily. 06/30/15  Yes Arnoldo Morale, MD  lisinopril (PRINIVIL,ZESTRIL) 20 MG tablet Take 1 tablet (20 mg total) by mouth daily. 06/30/15  Yes Arnoldo Morale, MD  nitroGLYCERIN (NITROSTAT) 0.4 MG SL tablet Place 1 tablet (0.4 mg total) under the tongue every 5 (five) minutes as needed for chest pain. 01/16/15  Yes Rogelia Mire, NP  pantoprazole (PROTONIX) 40 MG tablet Take 1 tablet (40 mg total) by mouth daily.  06/30/15 06/29/16 Yes Arnoldo Morale, MD  rivaroxaban (XARELTO) 20 MG TABS tablet Take 1 tablet (20 mg total) by mouth daily with supper. 06/30/15  Yes Arnoldo Morale, MD  glucose blood (ACCU-CHEK AVIVA PLUS) test strip Use as instructed 07/21/15   Arnoldo Morale, MD  Insulin Pen Needle (ULTICARE MICRO PEN NEEDLES) 32G X 4 MM MISC 1 Syringe by Does not apply route 4 (four) times daily - after meals and at bedtime. 06/30/15   Arnoldo Morale, MD  Lancets (ACCU-CHEK SOFT TOUCH) lancets Use as instructed 07/21/15   Arnoldo Morale, MD   Physical Exam: Filed Vitals:   09/05/15 0030  BP: 120/73  Pulse: 62  Temp:   Resp: 10    BP 120/73 mmHg  Pulse 62  Temp(Src) 98.8 F (37.1 C) (Oral)  Resp 10  Ht _0  (1.626 m)  Wt 102.059 kg (225 lb)  BMI 38.60 kg/m2  SpO2 96%  LMP 11/27/2012  General Appearance:    Alert, oriented, no distress, appears stated age  Head:    Normocephalic, atraumatic  Eyes:    PERRL, EOMI, sclera non-icteric        Nose:   Nares without drainage or epistaxis. Mucosa, turbinates normal  Throat:   Moist mucous membranes. Oropharynx without erythema or exudate.  Neck:   Supple. No carotid bruits.  No thyromegaly.  No lymphadenopathy.   Back:     No CVA tenderness, no spinal tenderness   Lungs:     Clear to auscultation bilaterally, without wheezes, rhonchi or rales  Chest wall:    No tenderness to palpitation  Heart:   Tachycardic, irregular, without murmurs, gallops, rubs  Abdomen:     Soft, non-tender, nondistended, normal bowel sounds, no organomegaly  Genitalia:    deferred  Rectal:    deferred  Extremities:   No clubbing, cyanosis or edema.  Pulses:   2+ and symmetric all extremities  Skin:   Skin color, texture, turgor normal, no rashes or lesions  Lymph nodes:   Cervical, supraclavicular, and axillary nodes normal  Neurologic:   CNII-XII intact. Normal strength, sensation and reflexes      throughout    Labs on Admission:  Basic Metabolic Panel:  Recent Labs Lab 09/04/15 2245  NA 130*  K 4.1  CL 89*  CO2 29  GLUCOSE 474*  BUN 34*  CREATININE 1.27*  CALCIUM 8.8*   Liver Function Tests: No results for input(s): AST, ALT, ALKPHOS, BILITOT, PROT, ALBUMIN in the last 168 hours. No results for input(s): LIPASE, AMYLASE in the last 168 hours. No results for input(s): AMMONIA in the last 168 hours. CBC:  Recent Labs Lab 09/04/15 2245  WBC 12.2*  NEUTROABS 8.6*  HGB 11.2*  HCT 33.5*  MCV 86.8  PLT 318   Cardiac Enzymes: No results for input(s): CKTOTAL, CKMB, CKMBINDEX, TROPONINI in the last 168 hours.  BNP (last 3 results)  Recent Labs  09/07/14 0406 10/29/14 0222  PROBNP 1423.0* 1070.0*   CBG: No results for input(s): GLUCAP in the last 168 hours.  Radiological Exams on Admission: Dg Chest Port 1 View  09/04/2015  CLINICAL DATA:  Chest pain and palpitations. EXAM: PORTABLE CHEST 1 VIEW COMPARISON:  Frontal and lateral views 06/23/2015, chest CT 07/04/2015 FINDINGS: The cardiomediastinal contours are unchanged with heart at the upper limits of normal in size in atherosclerosis of the thoracic aorta. Pulmonary vasculature is normal. No consolidation, pleural effusion, or pneumothorax. No acute osseous abnormalities are seen. IMPRESSION:  Borderline cardiomegaly, unchanged.  No acute pulmonary process. Electronically Signed   By: Jeb Levering M.D.   On: 09/04/2015 23:03    EKG: Independently reviewed.  Assessment/Plan Principal Problem:   Atrial fibrillation with RVR ([name]) Active Problems:   DM (diabetes mellitus), type 2 with peripheral vascular complications ([name])   Hypothyroidism   Chronic diastolic CHF (congestive heart failure) ([name])   COPD (chronic obstructive pulmonary disease) ([name])   Chest pain   1. A.Fib RVR -  1. Rate control with Cardizem gtt 2. Continue home metoprolol 3. Tele monitor 4. Goal HR < 110 5. On Alen Blew already, also is on ASA and plavix too (probably want to talk with cards prior to DC to see if she really needs to be on all of these blood thinners simultaneously, for the moment I will leave this alone though). 2. DM2 -  1. Insulin gtt due to BGL over 400 2. Diabetes coordinator consult, I suspect her lantus needs to be increased as most of her readings at home are in the 300s or higher 3. HTN - hold home ACEi due to borderline BPs today in ED 4. CHF - hold home lasix due to borderline BPs today in ED, watch for exacerbation as I am giving some IVF due to borderline BPs 5. COPD - continue home nebs 6. Hypothyroidism - continue synthroid, TSH pending 7. Chest pain - serial trops and tele monitor ordered, but suspect this is rate mediated as it was during last admit in Aug. 8. DVT ppx - Xeralto    Code Status: Full  Family Communication: No family in room Disposition Plan: Admit to inpatient   Time spent: 70 min  Suriyah Vergara M. [facility] Pager [phone]  If 7AM-7PM, please contact the day team taking care of the patient [email or web address] Password [email or web address] 09/05/2015, 1:02 AM

## 2015-09-05 NOTE — Progress Notes (Signed)
While pt asleep, O2 sats dropped to 79-84.  Placed on 2L O2.  Sats increased to 98.  Pt states does not wear cpap at night.  While awake pt's sats 94-98 on room air.  Will continue to monitor.

## 2015-09-05 NOTE — Progress Notes (Signed)
Has appt with Dr. Gwenlyn Found scheduled 09/29/15

## 2015-09-05 NOTE — Consult Note (Signed)
Reason for Consult: atrial fibrillation with RVR, elevated troponin  Requesting Physician: Eliseo Squires  Cardiologist: Gwenlyn Found  HPI: This is a 54 y.o. female with a past medical history significant for "small vessel" CAD (cath 10/2014) and recurrent paroxysmal atrial fibrillation, PAD, carotid stenosis s/p CEA, mild ischemic cardiomyopathy (EF45%) and chronic compensated CHF presents with atrial fibrillation with RVR and a marginal increase in troponin I (0.3). Denies angina or manifestations of CHF. Severe hyperglycemia. Baseline ECG shows extensive ST-T changes that worsened during tachycardia, but no ST elevation. atrial fibrillation resolved around 0300h last night and has not recurred.  She reports compliance with meds including her insulin and carvedilol and denies recent drug use, fever/chills, cough, edema or other CV complaints. No bleeding, no focal neurological events.  Currently no CV symptoms.  PMHx:  Past Medical History  Diagnosis Date  . Alcohol abuse   . Narcotic abuse   . Cocaine abuse   . Marijuana abuse   . Tobacco abuse   . Alcoholic cirrhosis ([place])   . Cardiomyopathy ([place])   . Obesity   . Chronic systolic CHF (congestive heart failure) ([place])     a. Last echo 12/205: EF 40-45%, inferoapical/posterior HK, not technically sufficient to allow eval of LV diastolic function, normal LA in size..  . Hypothyroidism   . GERD (gastroesophageal reflux disease)   . PAF (paroxysmal atrial fibrillation) ([place])   . Critical lower limb ischemia   . Hypertension   . CAD (coronary artery disease)     a. cath 11/10/2014 small vessel CAD. Demand ischemia in the setting of rapid a-fib  . Pneumonia     END MARCH 2016  . Anxiety   . Wrist fracture     LEFT; "just did xray and cast"  . Poorly controlled type 2 diabetes mellitus ([place])   . Hyperlipemia   . Peripheral arterial disease ([place])     a. 01/2015 Angio/PTA: LSFA 100 w/ recon @ adductor canal and 1 vessel runoff via AT,  RSFA 99 (atherectomy/pta) - 1 vessel runoff via diff dzs peroneal.  . Carotid artery disease ([place])     a. 01/2015 Carotid Angio: RICA 201, LICA 00F. s/p L carotid endarterectomy 02/2015.  [name] COPD (chronic obstructive pulmonary disease) ([place])   . Arthritis     "hands, arms, shoulders, legs, back" (04/29/2015)  . Diabetic peripheral neuropathy [facility])    Past Surgical History  Procedure Laterality Date  . Cardioversion  ~ 02/2013    "twice"   . Lower extremity angiogram N/A 09/10/2013    Procedure: LOWER EXTREMITY ANGIOGRAM;  Surgeon: [name], MD;  Location: [facility] CATH LAB;  Service: Cardiovascular;  Laterality: N/A;  . Left heart catheterization with coronary angiogram N/A 10/31/2014    Procedure: LEFT HEART CATHETERIZATION WITH CORONARY ANGIOGRAM;  Surgeon: Burnell Blanks, MD;  Location: [facility] CATH LAB;  Service: Cardiovascular;  Laterality: N/A;  . Peripheral athrectomy Right 01/15/2015    SFA/notes 01/15/2015  . Balloon angioplasty, artery Right 01/15/2015    SFA/notes 01/15/2015  . Cardiac catheterization    . Lower extremity angiogram N/A 01/15/2015    Procedure: LOWER EXTREMITY ANGIOGRAM;  Surgeon: [name], MD;  Location: [facility] CATH LAB;  Service: Cardiovascular;  Laterality: N/A;  . Carotid angiogram N/A 01/15/2015    Procedure: CAROTID ANGIOGRAM;  Surgeon: [name], MD;  Location: [facility] CATH LAB;  Service: Cardiovascular;  Laterality: N/A;  . Endarterectomy Left 02/19/2015    Procedure: LEFT CAROTID ENDARTERECTOMY ;  Surgeon: Serafina Mitchell, MD;  Location: [facility] OR;  Service: Vascular;  Laterality: Left;  . Dilation and curettage of uterus  1988    FAMHx: Family History  Problem Relation Age of Onset  . Hypertension Mother   . Diabetes Mother   . Cancer Mother     breast, ovarian, colon  . Clotting disorder Mother   . Heart disease Mother   . Heart attack Mother   . Hypertension Father   . Heart disease Father   . Emphysema Sister     smoked    SOCHx:  reports  that she has been smoking E-cigarettes.  She has been smoking about 0.00 packs per day for the past 40 years. She has never used smokeless tobacco. She reports that she does not drink alcohol or use illicit drugs.  ALLERGIES: Allergies  Allergen Reactions  . Amiodarone Nausea And Vomiting    ROS: Pertinent items noted in HPI and remainder of comprehensive ROS otherwise negative.  HOME MEDICATIONS: Prescriptions prior to admission  Medication Sig Dispense Refill Last Dose  . albuterol (PROVENTIL HFA;VENTOLIN HFA) 108 (90 BASE) MCG/ACT inhaler Inhale 2 puffs into the lungs every 6 (six) hours as needed for wheezing or shortness of breath. 1 each 3 Past Month at Unknown time  . albuterol (PROVENTIL) (2.5 MG/3ML) 0.083% nebulizer solution Take 3 mLs (2.5 mg total) by nebulization every 6 (six) hours as needed. For wheezing. 75 mL 2 Past Month at Unknown time  . atorvastatin (LIPITOR) 40 MG tablet Take 1 tablet (40 mg total) by mouth daily. 30 tablet 2 09/04/2015 at Unknown time  . beta carotene w/minerals (OCUVITE) tablet Take 1 tablet by mouth daily.   09/04/2015 at Unknown time  . Blood Glucose Monitoring Suppl (ACCU-CHEK AVIVA PLUS) W/DEVICE KIT 1 Device by Does not apply route 4 (four) times daily -  before meals and at bedtime. 1 kit 0 09/04/2015 at Unknown time  . carvedilol (COREG) 25 MG tablet Take 1 tablet (25 mg total) by mouth 2 (two) times daily with a meal. 60 tablet 0 09/04/2015 at 0800  . Cholecalciferol (VITAMIN D PO) Take 1 capsule by mouth daily.   09/04/2015 at Unknown time  . clopidogrel (PLAVIX) 75 MG tablet Take 1 tablet (75 mg total) by mouth daily with breakfast. 30 tablet 6 09/04/2015 at Unknown time  . cyclobenzaprine (FLEXERIL) 10 MG tablet Take 1 tablet (10 mg total) by mouth 3 (three) times daily as needed for muscle spasms. 30 tablet 0 Past Week at Unknown time  . furosemide (LASIX) 40 MG tablet Take 1.5 tablets (60 mg total) by mouth 2 (two) times daily. 90 tablet 2  09/04/2015 at Unknown time  . Gabapentin, Once-Daily, (GRALISE) 300 MG TABS Take 1 tablet by mouth 4 (four) times daily.   09/04/2015 at Unknown time  . glipiZIDE (GLUCOTROL XL) 10 MG 24 hr tablet Take 2 tablets (20 mg total) by mouth daily with breakfast. 60 tablet 3 09/04/2015 at Unknown time  . insulin aspart (NOVOLOG) 100 UNIT/ML FlexPen Sliding scale (Patient taking differently: Inject 2-8 Units into the skin 3 (three) times daily with meals. Sliding scale) 15 mL 11 09/04/2015 at Unknown time  . Insulin Glargine (LANTUS) 100 UNIT/ML Solostar Pen Inject 60 units subcutaneous hs 15 mL 3 09/03/2015 at Unknown time  . levothyroxine (SYNTHROID, LEVOTHROID) 50 MCG tablet Take 1 tablet (50 mcg total) by mouth daily. 30 tablet 2 09/04/2015 at Unknown time  . lisinopril (PRINIVIL,ZESTRIL) 20 MG tablet Take 1  tablet (20 mg total) by mouth daily. 30 tablet 2 09/04/2015 at Unknown time  . nitroGLYCERIN (NITROSTAT) 0.4 MG SL tablet Place 1 tablet (0.4 mg total) under the tongue every 5 (five) minutes as needed for chest pain. 25 tablet 3 09/04/2015 at unknown  . pantoprazole (PROTONIX) 40 MG tablet Take 1 tablet (40 mg total) by mouth daily. 30 tablet 2 09/04/2015 at Unknown time  . rivaroxaban (XARELTO) 20 MG TABS tablet Take 1 tablet (20 mg total) by mouth daily with supper. 30 tablet 2 09/04/2015 at Unknown time  . glucose blood (ACCU-CHEK AVIVA PLUS) test strip Use as instructed 100 each 12   . Insulin Pen Needle (ULTICARE MICRO PEN NEEDLES) 32G X 4 MM MISC 1 Syringe by Does not apply route 4 (four) times daily - after meals and at bedtime. 1 each 11 Taking  . Lancets (ACCU-CHEK SOFT TOUCH) lancets Use as instructed 100 each 12     HOSPITAL MEDICATIONS: Scheduled: . atorvastatin  40 mg Oral Daily  . carvedilol  25 mg Oral BID WC  . clopidogrel  75 mg Oral Q breakfast  . Gabapentin (Once-Daily)  300 mg Oral QID  . insulin aspart  0-15 Units Subcutaneous TID WC  . insulin aspart  0-5 Units  Subcutaneous QHS  . insulin glargine  60 Units Subcutaneous q morning - 10a  . insulin regular  0-10 Units Intravenous TID WC  . levothyroxine  50 mcg Oral QAC breakfast  . pantoprazole  40 mg Oral Daily  . rivaroxaban  20 mg Oral Q supper  . sodium chloride  3 mL Intravenous Q12H   Continuous: . sodium chloride Stopped (09/05/15 0700)  . sodium chloride 10 mL/hr at 09/05/15 0900  . dextrose 5 % and 0.45% NaCl 75 mL/hr at 09/05/15 0627  . insulin (NOVOLIN-R) infusion 7.7 Units/hr (09/05/15 0730)    VITALS: Blood pressure 157/75, pulse 81, temperature 97.8 F (36.6 C), temperature source Oral, resp. rate 19, height '5\' 4"'  (1.626 m), weight 227 lb 1.2 oz (103 kg), last menstrual period 11/27/2012, SpO2 92 %.  PHYSICAL EXAM:  General: Alert, oriented x3, no distress Head: no evidence of trauma, PERRL, EOMI, no exophtalmos or lid lag, no myxedema, no xanthelasma; normal ears, nose and oropharynx Neck: normal jugular venous pulsations and no hepatojugular reflux; brisk carotid pulses without delay and no carotid bruits Chest: clear to auscultation, no signs of consolidation by percussion or palpation, normal fremitus, symmetrical and full respiratory excursions Cardiovascular: normal position and quality of the apical impulse, regular rhythm, normal first heart sound and normal second heart sound, no rubs or gallops, no murmur Abdomen: no tenderness or distention, no masses by palpation, no abnormal pulsatility or arterial bruits, normal bowel sounds, no hepatosplenomegaly Extremities: no clubbing, cyanosis;  no edema; 2+ radial, ulnar and brachial pulses bilaterally; 2+ right femoral, posterior tibial and dorsalis pedis pulses; 2+ left femoral, posterior tibial and dorsalis pedis pulses; no subclavian or femoral bruits Neurological: grossly nonfocal   LABS  CBC  Recent Labs  09/04/15 2245 09/05/15 0131  WBC 12.2* 11.9*  NEUTROABS 8.6*  --   HGB 11.2* 10.4*  HCT 33.5* 32.4*  MCV  86.8 87.3  PLT 318 761   Basic Metabolic Panel  Recent Labs  09/04/15 2245 09/05/15 0131  NA 130* 134*  K 4.1 4.4  CL 89* 97*  CO2 29 25  GLUCOSE 474* 416*  BUN 34* 38*  CREATININE 1.27* 1.23*  CALCIUM 8.8* 8.5*   Liver Function Tests  No results for input(s): AST, ALT, ALKPHOS, BILITOT, PROT, ALBUMIN in the last 72 hours. No results for input(s): LIPASE, AMYLASE in the last 72 hours. Cardiac Enzymes  Recent Labs  09/05/15 0137 09/05/15 0710  TROPONINI 0.07* 0.31*   Thyroid Function Tests  Recent Labs  09/05/15 0136  TSH 1.719      IMAGING: Dg Chest Port 1 View  09/04/2015  CLINICAL DATA:  Chest pain and palpitations. EXAM: PORTABLE CHEST 1 VIEW COMPARISON:  Frontal and lateral views 06/23/2015, chest CT 07/04/2015 FINDINGS: The cardiomediastinal contours are unchanged with heart at the upper limits of normal in size in atherosclerosis of the thoracic aorta. Pulmonary vasculature is normal. No consolidation, pleural effusion, or pneumothorax. No acute osseous abnormalities are seen. IMPRESSION: Borderline cardiomegaly, unchanged.  No acute pulmonary process. Electronically Signed   By: Jeb Levering M.D.   On: 09/04/2015 23:03    ECG: atrial fibrillation with RVR, diffuse ST-T changes  TELEMETRY: NSR  IMPRESSION:  1. Paroxysmal atrial fibrillation - converted on her own. Maintaining NSR, will place on even higher dose BB. Recent TSH wnl. Continue Xarelto. Note increased risk for bleeding while on concomitant Xarelto/Plavix, but her extensive vascular disease justifies this combination at this time. 2. Chronic systolic CHF - weight at her previous estimated optimal level of 225-227. She appears euvolemic. Continue BB, ACEI, Lasix.  3. PVD s/p LE PTA 01/2015, carotid endarterectomy 02/2015 - she is maintained on Plavix. 4. Anemia - improved since August. 5. Essential HTN - controlled. 6. CAD (diffuse small vessel disease 10/2014) - in December 2015 and August  2016 she bumped her troponin in the setting of rapid AF + small vessel disease, and did the same again this recent admission, albeit slightly higher.   RECOMMENDATION: 1. Increase carvedilol to 37.5 mg BID 2. Further workup for ischemic heart disease not planned at this time  Time Spent Directly with Patient: 60 minutes  Sanda Klein, MD, [facility] [phone] office [phone] pager   09/05/2015, 9:14 AM

## 2015-09-05 NOTE — Progress Notes (Signed)
CBG 329.  Dr. Eliseo Squires notified.  Will continue with sliding scale and transfer to floor as ordered.  Will continue to monitor.

## 2015-09-05 NOTE — Progress Notes (Signed)
Inpatient Diabetes Program Recommendations  AACE/ADA: New Consensus Statement on Inpatient Glycemic Control (2015)  Target Ranges:  Prepandial:   less than 140 mg/dL      Peak postprandial:   less than 180 mg/dL (1-2 hours)      Critically ill patients:  140 - 180 mg/dL   Review of Glycemic Control:  Results for ONYINYE, MORALIS (MRN [id number]) as of 09/05/2015 17:25  Ref. Range 09/05/2015 09:18 09/05/2015 10:12 09/05/2015 11:12 09/05/2015 12:19 09/05/2015 16:15  Glucose-Capillary Latest Ref Range: 65-99 mg/dL 233 (H) 279 (H) 225 (H) 173 (H) 329 (H)    Diabetes history: Type 2 diabetes Outpatient Diabetes medications: Lantus 60 units daily, Novolog 8 units tid with meals, Glipizide 20 mg daily Current orders for Inpatient glycemic control: Novolog moderate tid with meals and HS, Lantus 60 units daily.  A1C pending.  Inpatient Diabetes Program Recommendations:   Please consider adding Novolog meal coverage 8 units tid with meals.  Note that patient currently gets medications from [place].  Will need close follow-up with PCP.   Thanks, Adah Perl, RN, BC-ADM Inpatient Diabetes Coordinator Pager [phone] (8a-5p)

## 2015-09-05 NOTE — Progress Notes (Signed)
Patient admitted after midnight- please see H&P.  Will start lantus and d/c insulin gtt Wean off cardizem Cards consult  Eulogio Bear

## 2015-09-06 DIAGNOSIS — E039 Hypothyroidism, unspecified: Secondary | ICD-10-CM

## 2015-09-06 DIAGNOSIS — I2510 Atherosclerotic heart disease of native coronary artery without angina pectoris: Secondary | ICD-10-CM | POA: Insufficient documentation

## 2015-09-06 DIAGNOSIS — J438 Other emphysema: Secondary | ICD-10-CM

## 2015-09-06 LAB — BASIC METABOLIC PANEL
Anion gap: 7 (ref 5–15)
BUN: 29 mg/dL — AB (ref 6–20)
CALCIUM: 8.5 mg/dL — AB (ref 8.9–10.3)
CO2: 26 mmol/L (ref 22–32)
CREATININE: 0.97 mg/dL (ref 0.44–1.00)
Chloride: 101 mmol/L (ref 101–111)
GFR calc Af Amer: >60 mL/min (ref >60)
GFR calc non Af Amer: >60 mL/min (ref >60)
GLUCOSE: 279 mg/dL — AB (ref 65–99)
Potassium: 4.1 mmol/L (ref 3.5–5.1)
Sodium: 134 mmol/L — ABNORMAL LOW (ref 135–145)

## 2015-09-06 LAB — CBC
HEMATOCRIT: 30.5 % — AB (ref 36.0–46.0)
Hemoglobin: 9.7 g/dL — ABNORMAL LOW (ref 12.0–15.0)
MCH: 28 pg (ref 26.0–34.0)
MCHC: 31.8 g/dL (ref 30.0–36.0)
MCV: 87.9 fL (ref 78.0–100.0)
Platelets: 261 10*3/uL (ref 150–400)
RBC: 3.47 MIL/uL — ABNORMAL LOW (ref 3.87–5.11)
RDW: 13.4 % (ref 11.5–15.5)
WBC: 9.8 10*3/uL (ref 4.0–10.5)

## 2015-09-06 LAB — URINE CULTURE

## 2015-09-06 LAB — GLUCOSE, CAPILLARY
GLUCOSE-CAPILLARY: 381 mg/dL — AB (ref 65–99)
Glucose-Capillary: 257 mg/dL — ABNORMAL HIGH (ref 65–99)
Glucose-Capillary: 430 mg/dL — ABNORMAL HIGH (ref 65–99)
Glucose-Capillary: 271 mg/dL — ABNORMAL HIGH (ref 65–99)

## 2015-09-06 MED ORDER — INSULIN ASPART 100 UNIT/ML ~~LOC~~ SOLN
8.0000 [IU] | Freq: Three times a day (TID) | SUBCUTANEOUS | Status: DC
  Administered 2015-09-06 – 2015-09-07 (×4): 8 [IU] via SUBCUTANEOUS

## 2015-09-06 MED ORDER — ACETAMINOPHEN 325 MG PO TABS
650.0000 mg | ORAL_TABLET | Freq: Four times a day (QID) | ORAL | Status: DC | PRN
  Administered 2015-09-06 – 2015-09-07 (×4): 650 mg via ORAL
  Filled 2015-09-06 (×4): qty 2

## 2015-09-06 NOTE — Progress Notes (Signed)
Notified MD about CBG being 420. Pt was asymptomatic. Will continue to monitor.

## 2015-09-06 NOTE — Progress Notes (Signed)
PROGRESS NOTE  [name] [id number] DOB: Apr 11, 1961 DOA: 09/04/2015 PCP: Kevan Ny, MD  Assessment/Plan: A.Fib RVR -  Off cardiazem gtt Cards consult for mildly elevated troponin- suspect from increased rate Resume home meds On Xarelto/plavix  DM2 -  Change to home meds Diabetes coordinator consult   HTN - hold home ACEi due to borderline BPs today in ED  CHF - hold home lasix due to borderline BPs today in ED, watch for exacerbation as I am giving some IVF due to borderline BPs  COPD - continue home nebs  Hypothyroidism - continue synthroid, TSH pending  Chest pain - serial trops and tele monitor ordered, but suspect this is rate mediated as it was during last admit in Aug.  prob OSA- outpatient sleep study  Dehydration -CR improved -Hgb trending down   Code Status: full Family Communication: patient Disposition Plan: home in AM?   Consultants:  cardiology  Procedures:    Antibiotics:    HPI/Subjective: C/o siatica  Objective: Filed Vitals:   09/06/15 0435  BP: 154/75  Pulse: 87  Temp: 97.6 F (36.4 C)  Resp: 16    Intake/Output Summary (Last 24 hours) at 09/06/15 1011 Last data filed at 09/05/15 1818  Gross per 24 hour  Intake 413.79 ml  Output    900 ml  Net -486.21 ml   Filed Weights   09/04/15 2142 09/05/15 0145  Weight: 102.059 kg (225 lb) 103 kg (227 lb 1.2 oz)    Exam:   General:  Awake, NAD  Cardiovascular: rrr  Respiratory: clear  Abdomen: obese, +BS  Musculoskeletal: +edema   Data Reviewed: Basic Metabolic Panel:  Recent Labs Lab 09/04/15 2245 09/05/15 0131 09/06/15 0319  NA 130* 134* 134*  K 4.1 4.4 4.1  CL 89* 97* 101  CO2 29 25 26   GLUCOSE 474* 416* 279*  BUN 34* 38* 29*  CREATININE 1.27* 1.23* 0.97  CALCIUM 8.8* 8.5* 8.5*   Liver Function Tests: No results for input(s): AST, ALT, ALKPHOS, BILITOT, PROT, ALBUMIN in the last 168 hours. No results for input(s): LIPASE, AMYLASE in the  last 168 hours. No results for input(s): AMMONIA in the last 168 hours. CBC:  Recent Labs Lab 09/04/15 2245 09/05/15 0131 09/06/15 0319  WBC 12.2* 11.9* 9.8  NEUTROABS 8.6*  --   --   HGB 11.2* 10.4* 9.7*  HCT 33.5* 32.4* 30.5*  MCV 86.8 87.3 87.9  PLT 318 316 261   Cardiac Enzymes:  Recent Labs Lab 09/05/15 0137 09/05/15 0710 09/05/15 1405  TROPONINI 0.07* 0.31* 0.29*   BNP (last 3 results)  Recent Labs  04/29/15 1553 06/14/15 0500 06/23/15 0005  BNP 1483.5* 1044.2* 539.4*    ProBNP (last 3 results)  Recent Labs  09/07/14 0406 10/29/14 0222  PROBNP 1423.0* 1070.0*    CBG:  Recent Labs Lab 09/05/15 1112 09/05/15 1219 09/05/15 1615 09/05/15 2215 09/06/15 0619  GLUCAP 225* 173* 329* 334* 257*    Recent Results (from the past 240 hour(s))  MRSA PCR Screening     Status: None   Collection Time: 09/05/15  2:21 AM  Result Value Ref Range Status   MRSA by PCR NEGATIVE NEGATIVE Final    Comment:        The GeneXpert MRSA Assay (FDA approved for NASAL specimens only), is one component of a comprehensive MRSA colonization surveillance program. It is not intended to diagnose MRSA infection nor to guide or monitor treatment for MRSA infections.      Studies: Dg  Chest Port 1 View  09/04/2015  CLINICAL DATA:  Chest pain and palpitations. EXAM: PORTABLE CHEST 1 VIEW COMPARISON:  Frontal and lateral views 06/23/2015, chest CT 07/04/2015 FINDINGS: The cardiomediastinal contours are unchanged with heart at the upper limits of normal in size in atherosclerosis of the thoracic aorta. Pulmonary vasculature is normal. No consolidation, pleural effusion, or pneumothorax. No acute osseous abnormalities are seen. IMPRESSION: Borderline cardiomegaly, unchanged.  No acute pulmonary process. Electronically Signed   By: Jeb Levering M.D.   On: 09/04/2015 23:03    Scheduled Meds: . atorvastatin  40 mg Oral Daily  . carvedilol  37.5 mg Oral BID WC  .  clopidogrel  75 mg Oral Q breakfast  . Gabapentin (Once-Daily)  300 mg Oral QID  . insulin aspart  0-15 Units Subcutaneous TID WC  . insulin aspart  0-5 Units Subcutaneous QHS  . insulin aspart  8 Units Subcutaneous TID WC  . insulin glargine  60 Units Subcutaneous q morning - 10a  . levothyroxine  50 mcg Oral QAC breakfast  . pantoprazole  40 mg Oral Daily  . rivaroxaban  20 mg Oral Q supper  . sodium chloride  3 mL Intravenous Q12H   Continuous Infusions: . sodium chloride Stopped (09/05/15 0959)   Antibiotics Given (last 72 hours)    None      Principal Problem:   Atrial fibrillation with RVR ([name]) Active Problems:   DM (diabetes mellitus), type 2 with peripheral vascular complications ([name])   Hypothyroidism   Chronic diastolic CHF (congestive heart failure) ([name])   COPD (chronic obstructive pulmonary disease) ([name])   Chest pain    Time spent: 25 min    Daymien Goth  [facility] Pager [phone]. If 7PM-7AM, please contact night-coverage at [email or web address], password [facility] 09/06/2015, 10:11 AM  LOS: 1 day

## 2015-09-06 NOTE — Progress Notes (Signed)
Patient Name: [name] Date of Encounter: 09/06/2015  Principal Problem:   Atrial fibrillation with RVR ([place]) Active Problems:   DM (diabetes mellitus), type 2 with peripheral vascular complications ([name])   Hypothyroidism   Chronic diastolic CHF (congestive heart failure) ([name])   COPD (chronic obstructive pulmonary disease) ([place])   Chest pain   Length of Stay: 1  SUBJECTIVE  She still reports compliance with home cardiac meds, but the prompt drop in BP makes me wonder whether she was really taking them as prescribed. No further AF. No cardiac complaints. Reports pain in R hip and thigh radiating to toes - sciatica?  CURRENT MEDS . atorvastatin  40 mg Oral Daily  . carvedilol  37.5 mg Oral BID WC  . clopidogrel  75 mg Oral Q breakfast  . Gabapentin (Once-Daily)  300 mg Oral QID  . insulin aspart  0-15 Units Subcutaneous TID WC  . insulin aspart  0-5 Units Subcutaneous QHS  . insulin aspart  8 Units Subcutaneous TID WC  . insulin glargine  60 Units Subcutaneous q morning - 10a  . levothyroxine  50 mcg Oral QAC breakfast  . pantoprazole  40 mg Oral Daily  . rivaroxaban  20 mg Oral Q supper  . sodium chloride  3 mL Intravenous Q12H    OBJECTIVE   Intake/Output Summary (Last 24 hours) at 09/06/15 1109 Last data filed at 09/05/15 1818  Gross per 24 hour  Intake 401.95 ml  Output    900 ml  Net -498.05 ml   Filed Weights   09/04/15 2142 09/05/15 0145  Weight: 225 lb (102.059 kg) 227 lb 1.2 oz (103 kg)    PHYSICAL EXAM Filed Vitals:   09/05/15 1700 09/05/15 1817 09/05/15 2203 09/06/15 0435  BP: 127/86 116/48 153/67 154/75  Pulse: 77 77 78 87  Temp:  98.6 F (37 C) 98 F (36.7 C) 97.6 F (36.4 C)  TempSrc:  Oral Oral Oral  Resp: 12 16 16 16   Height:      Weight:      SpO2: 100% 97% 98% 96%   General: Alert, oriented x3, no distress Head: no evidence of trauma, PERRL, EOMI, no exophtalmos or lid lag, no myxedema, no xanthelasma; normal ears, nose and  oropharynx Neck: normal jugular venous pulsations and no hepatojugular reflux; brisk carotid pulses without delay and no carotid bruits Chest: clear to auscultation, no signs of consolidation by percussion or palpation, normal fremitus, symmetrical and full respiratory excursions Cardiovascular: normal position and quality of the apical impulse, regular rhythm, normal first and second heart sounds, no rubs or gallops, no murmur Abdomen: no tenderness or distention, no masses by palpation, no abnormal pulsatility or arterial bruits, normal bowel sounds, no hepatosplenomegaly Extremities: no clubbing, cyanosis or edema; 2+ radial, ulnar and brachial pulses bilaterally; 2+ right femoral, posterior tibial and dorsalis pedis pulses; 2+ left femoral, posterior tibial and dorsalis pedis pulses; no subclavian or femoral bruits Neurological: grossly nonfocal  LABS  CBC  Recent Labs  09/04/15 2245 09/05/15 0131 09/06/15 0319  WBC 12.2* 11.9* 9.8  NEUTROABS 8.6*  --   --   HGB 11.2* 10.4* 9.7*  HCT 33.5* 32.4* 30.5*  MCV 86.8 87.3 87.9  PLT 318 316 0000000   Basic Metabolic Panel  Recent Labs  09/05/15 0131 09/06/15 0319  NA 134* 134*  K 4.4 4.1  CL 97* 101  CO2 25 26  GLUCOSE 416* 279*  BUN 38* 29*  CREATININE 1.23* 0.97  CALCIUM 8.5* 8.5*  Liver Function Tests No results for input(s): AST, ALT, ALKPHOS, BILITOT, PROT, ALBUMIN in the last 72 hours. No results for input(s): LIPASE, AMYLASE in the last 72 hours. Cardiac Enzymes  Recent Labs  09/05/15 0137 09/05/15 0710 09/05/15 1405  TROPONINI 0.07* 0.31* 0.29*  Thyroid Function Tests  Recent Labs  09/05/15 0136  TSH 1.719    Radiology Studies Imaging results have been reviewed and Dg Chest Port 1 View  09/04/2015  CLINICAL DATA:  Chest pain and palpitations. EXAM: PORTABLE CHEST 1 VIEW COMPARISON:  Frontal and lateral views 06/23/2015, chest CT 07/04/2015 FINDINGS: The cardiomediastinal contours are unchanged with heart  at the upper limits of normal in size in atherosclerosis of the thoracic aorta. Pulmonary vasculature is normal. No consolidation, pleural effusion, or pneumothorax. No acute osseous abnormalities are seen. IMPRESSION: Borderline cardiomegaly, unchanged.  No acute pulmonary process. Electronically Signed   By: Jeb Levering M.D.   On: 09/04/2015 23:03    TELE NSR   ASSESSMENT AND PLAN  1. Paroxysmal atrial fibrillation - converted on her own. Maintaining NSR, placed on even higher dose BB. Recent TSH wnl. Continue Xarelto. Note increased risk for bleeding while on concomitant Xarelto/Plavix, but her extensive vascular disease justifies this combination at this time. May need to back down on beta blocker again if her BP is too low (was she really compliant?). Reviewed the risk of rebound arrhythmia if she stops beta blocker abruptly. 2. Chronic systolic CHF - weight at her previous estimated optimal level of 225-227. She appears euvolemic. Continue BB, ACEI, temporariley hold Lasix.  3. PVD s/p LE PTA 01/2015, carotid endarterectomy 02/2015 - she is maintained on Plavix. 4. Anemia - improved since August. 5. Essential HTN - controlled. 6. CAD (diffuse small vessel disease 10/2014) - in December 2015 and August 2016 she bumped her troponin in the setting of rapid AF + small vessel disease, and did the same again this recent admission, albeit slightly higher.  Sanda Klein, MD, [facility] [phone] office [phone] pager 09/06/2015 11:09 AM

## 2015-09-07 ENCOUNTER — Ambulatory Visit (HOSPITAL_COMMUNITY): Payer: Medicaid Other

## 2015-09-07 DIAGNOSIS — R079 Chest pain, unspecified: Secondary | ICD-10-CM

## 2015-09-07 DIAGNOSIS — I5032 Chronic diastolic (congestive) heart failure: Secondary | ICD-10-CM

## 2015-09-07 DIAGNOSIS — I2510 Atherosclerotic heart disease of native coronary artery without angina pectoris: Secondary | ICD-10-CM

## 2015-09-07 DIAGNOSIS — I4891 Unspecified atrial fibrillation: Secondary | ICD-10-CM

## 2015-09-07 LAB — GLUCOSE, CAPILLARY
GLUCOSE-CAPILLARY: 303 mg/dL — AB (ref 65–99)
Glucose-Capillary: 252 mg/dL — ABNORMAL HIGH (ref 65–99)

## 2015-09-07 LAB — BASIC METABOLIC PANEL
Anion gap: 8 (ref 5–15)
BUN: 20 mg/dL (ref 6–20)
CALCIUM: 8.8 mg/dL — AB (ref 8.9–10.3)
CHLORIDE: 100 mmol/L — AB (ref 101–111)
CO2: 26 mmol/L (ref 22–32)
CREATININE: 0.86 mg/dL (ref 0.44–1.00)
GFR calc non Af Amer: >60 mL/min (ref >60)
GLUCOSE: 241 mg/dL — AB (ref 65–99)
Potassium: 4.1 mmol/L (ref 3.5–5.1)
Sodium: 134 mmol/L — ABNORMAL LOW (ref 135–145)

## 2015-09-07 LAB — CBC
HCT: 30.9 % — ABNORMAL LOW (ref 36.0–46.0)
Hemoglobin: 9.8 g/dL — ABNORMAL LOW (ref 12.0–15.0)
MCH: 27.9 pg (ref 26.0–34.0)
MCHC: 31.7 g/dL (ref 30.0–36.0)
MCV: 88 fL (ref 78.0–100.0)
Platelets: 275 10*3/uL (ref 150–400)
RBC: 3.51 MIL/uL — AB (ref 3.87–5.11)
RDW: 13.4 % (ref 11.5–15.5)
WBC: 9.3 10*3/uL (ref 4.0–10.5)

## 2015-09-07 LAB — HEMOGLOBIN A1C
HEMOGLOBIN A1C: 12.8 % — AB (ref 4.8–5.6)
Mean Plasma Glucose: 321 mg/dL

## 2015-09-07 MED ORDER — CARVEDILOL 12.5 MG PO TABS: 37.5000 mg | ORAL_TABLET | Freq: Two times a day (BID) | ORAL | Status: DC

## 2015-09-07 MED ORDER — FUROSEMIDE 40 MG PO TABS: 40.0000 mg | ORAL_TABLET | Freq: Two times a day (BID) | ORAL | Status: DC

## 2015-09-07 MED ORDER — TRAMADOL HCL 50 MG PO TABS: 50.0000 mg | ORAL_TABLET | Freq: Two times a day (BID) | ORAL | Status: DC | PRN

## 2015-09-07 MED ORDER — INSULIN GLARGINE 100 UNIT/ML ~~LOC~~ SOLN: 70.0000 [IU] | Freq: Every morning | SUBCUTANEOUS | Status: DC

## 2015-09-07 MED ORDER — INSULIN GLARGINE 100 UNIT/ML ~~LOC~~ SOLN
70.0000 [IU] | Freq: Every morning | SUBCUTANEOUS | Status: DC
  Administered 2015-09-07: 70 [IU] via SUBCUTANEOUS
  Filled 2015-09-07 (×2): qty 0.7

## 2015-09-07 MED ORDER — OXYCODONE HCL 5 MG PO TABS
5.0000 mg | ORAL_TABLET | Freq: Once | ORAL | Status: AC
  Administered 2015-09-07: 5 mg via ORAL
  Filled 2015-09-07: qty 1

## 2015-09-07 NOTE — Care Management Note (Signed)
Case Management Note Karla Gibbons RN, BSN Unit 2W-Case Manager [phone]  Patient Details  Name: [name] MRN: [id number] Date of Birth: 1961-10-01  Subjective/Objective:    Pt admitted with afib              Action/Plan: PTA pt states that she lives in [place] with sister- plan to return there post discharge- has PCP- Dr. [name]  Expected Discharge Date:   09/07/15               Expected Discharge Plan:  Home/Self Care  In-House Referral:     Discharge planning Services  CM Consult  Post Acute Care Choice:    Choice offered to:     DME Arranged:    DME Agency:     HH Arranged:    [place] Agency:     Status of Service:  Completed, signed off  Medicare Important Message Given:    Date Medicare IM Given:    Medicare IM give by:    Date Additional Medicare IM Given:    Additional Medicare Important Message give by:     If discussed at [place] of Stay Meetings, dates discussed:    Additional Comments:  Dawayne Patricia, RN 09/07/2015, 1:19 PM

## 2015-09-07 NOTE — Progress Notes (Signed)
Patient Name: [name] Date of Encounter: 09/07/2015  Principal Problem:   Atrial fibrillation with RVR ([place]) Active Problems:   DM (diabetes mellitus), type 2 with peripheral vascular complications ([name])   Hypothyroidism   Chronic diastolic CHF (congestive heart failure) ([name])   COPD (chronic obstructive pulmonary disease) ([name])   Chest pain   Coronary artery disease involving native coronary artery of native heart   Length of Stay: 2  SUBJECTIVE  She denies palpitations or SOB. No further AF. Reports pain in R hip and thigh radiating to toes - sciatica?  CURRENT MEDS . atorvastatin  40 mg Oral Daily  . carvedilol  37.5 mg Oral BID WC  . clopidogrel  75 mg Oral Q breakfast  . Gabapentin (Once-Daily)  300 mg Oral QID  . insulin aspart  0-15 Units Subcutaneous TID WC  . insulin aspart  0-5 Units Subcutaneous QHS  . insulin aspart  8 Units Subcutaneous TID WC  . insulin glargine  70 Units Subcutaneous q morning - 10a  . levothyroxine  50 mcg Oral QAC breakfast  . pantoprazole  40 mg Oral Daily  . rivaroxaban  20 mg Oral Q supper  . sodium chloride  3 mL Intravenous Q12H    OBJECTIVE   Intake/Output Summary (Last 24 hours) at 09/07/15 1046 Last data filed at 09/07/15 0730  Gross per 24 hour  Intake    840 ml  Output      0 ml  Net    840 ml   Filed Weights   09/04/15 2142 09/05/15 0145  Weight: 225 lb (102.059 kg) 227 lb 1.2 oz (103 kg)    PHYSICAL EXAM Filed Vitals:   09/06/15 1420 09/06/15 2222 09/07/15 0445 09/07/15 0800  BP: 156/82 149/70 164/72 177/84  Pulse: 80 80 78 97  Temp: 98.2 F (36.8 C) 99.3 F (37.4 C) 97.7 F (36.5 C)   TempSrc: Oral Oral Oral   Resp: 17 16 16    Height:      Weight:      SpO2: 97% 98% 97%    General: Alert, oriented x3, no distress Head: no evidence of trauma, PERRL, EOMI, no exophtalmos or lid lag, no myxedema, no xanthelasma; normal ears, nose and oropharynx Neck: normal jugular venous pulsations and no  hepatojugular reflux; brisk carotid pulses without delay and no carotid bruits Chest: clear to auscultation, no signs of consolidation by percussion or palpation, normal fremitus, symmetrical and full respiratory excursions Cardiovascular: normal position and quality of the apical impulse, regular rhythm, normal first and second heart sounds, no rubs or gallops, no murmur Abdomen: no tenderness or distention, no masses by palpation, no abnormal pulsatility or arterial bruits, normal bowel sounds, no hepatosplenomegaly Extremities: no clubbing, cyanosis or edema; 2+ radial, ulnar and brachial pulses bilaterally; 2+ right femoral, posterior tibial and dorsalis pedis pulses; 2+ left femoral, posterior tibial and dorsalis pedis pulses; no subclavian or femoral bruits Neurological: grossly nonfocal  LABS  CBC  Recent Labs  09/04/15 2245  09/06/15 0319 09/07/15 0006  WBC 12.2*  < > 9.8 9.3  NEUTROABS 8.6*  --   --   --   HGB 11.2*  < > 9.7* 9.8*  HCT 33.5*  < > 30.5* 30.9*  MCV 86.8  < > 87.9 88.0  PLT 318  < > 261 275  < > = values in this interval not displayed. Basic Metabolic Panel  Recent Labs  09/06/15 0319 09/07/15 0006  NA 134* 134*  K 4.1 4.1  CL 101 100*  CO2 26 26  GLUCOSE 279* 241*  BUN 29* 20  CREATININE 0.97 0.86  CALCIUM 8.5* 8.8*    Recent Labs  09/05/15 0137 09/05/15 0710 09/05/15 1405  TROPONINI 0.07* 0.31* 0.29*  Thyroid Function Tests  Recent Labs  09/05/15 0136  TSH 1.719    Radiology Studies Imaging results have been reviewed and No results found.  TELE NSR   ASSESSMENT AND PLAN  1. Paroxysmal atrial fibrillation - converted on her own. Maintaining NSR, placed on even higher dose BB. Recent TSH wnl. Continue Xarelto. Note increased risk for bleeding while on concomitant Xarelto/Plavix, but her extensive vascular disease justifies this combination at this time. May need to back down on beta blocker again if her BP is too low (was she really  compliant?). Reviewed the risk of rebound arrhythmia if she stops beta blocker abruptly. 2. Chronic systolic CHF - weight at her previous estimated optimal level of 225-227. She appears euvolemic. Continue BB, ACEI, temporariley hold Lasix.  3. PVD s/p LE PTA 01/2015, carotid endarterectomy 02/2015 - she is maintained on Plavix. 4. Anemia - improved since August. 5. Essential HTN - uncontrolled. Add cardizem CD 120 mg PO daily. 6. CAD (diffuse small vessel disease 10/2014) - in December 2015 and August 2016 she bumped her troponin in the setting of rapid AF + small vessel disease, and did the same again this recent admission, albeit slightly higher.  She could be discharged from cardiac standpoint. Complains of severe sciatica type of pain.   Dorothy Spark, MD, [facility] [phone] office [phone] pager 09/07/2015 10:46 AM

## 2015-09-07 NOTE — Progress Notes (Signed)
Pt ready for discharge, sister at bedside. Tele off-CCMD notified, IV out, some bleeding after removal. Meds from pharmacy picked up and returned to pt. Pressure held for 5 minutes and clean dressing applied. Pt given paper gown due to clothes being soiled. Reviewed meds and need for compliance with patient as well as discharge instructions on Afib and stroke prevention. Pt verbalized understanding of instructions, meds, and states she has an appt with PCP to f/u. Pt taken out via wheel chair with belongings.  Darel Hong M

## 2015-09-07 NOTE — Hospital Discharge Follow-Up (Signed)
Met with the patient to discuss her discharge follow up plan as she had been followed [facility] in the past.  Her PCP is Dr Sun and she stated that she plans to follow up with him after discharge.  She said that she has medicaid transportation to her medical appointments and she has also applied for SCAT.  She noted that she receives her medications from [facility] medication delivery and her co-pays are $3.00.   Currently, she is living  with her sister at the [facility] and she hopes to eventually secure permanent housing if her sister's disability is approved. Her sister has an upcoming disability hearing.   No other questions/problems reported.   

## 2015-09-07 NOTE — Clinical Documentation Improvement (Addendum)
Hospitalist  (please document your query responses in the progress notes and discharge summary, not on the query form itself.)  Query 1 of 3  (please scroll down to view all 3 queries Conflicting documentation exists in the current medical record.  The attending physician is required to resolve any conflicting documentation issues.  Possible Clinical Conditions:  - Chronic Systolic Heart Failure, stable  - Chronic Diastolic Heart Failure, stable  - Combined Systolic and Diastolic Heart Failure, stable  Clinical Information: "Chronic systolic heart failure" is documented in the current record. "Chronic diastolic heart failure is also documented in the current record. "Last echo 12/205: EF 40-45%, inferoapical/posterior HK, not technically sufficient to allow eval of LV diastolic function, normal LA in size.." is documented in the Past Medical History section of the H&P and progress notes.  Query 2 of 3 "Anemia" is documented in the current medical record.  For greater specificity and if able to determine, please document the acuity (if applicable) and the type of anemia in the progress notes and discharge summary.  Query 3 of 3  Possible Clinical Conditions:  - Demand Ischemia  - Other condition  - Unable to clinically determine  Clinical Information: "Chest pain - serial trops and tele monitor ordered, but suspect this is rate mediated as it was during last admit in Aug" is documented in the H&P "CAD (diffuse small vessel disease 10/2014) - in December 2015 and August 2016 she bumped her troponin in the setting of rapid AF + small vessel disease, and did the same again this recent admission, albeit slightly higher" is documented in the cardiology consult.  Troponin Trend this Admission: Component     Latest Ref Rng 09/05/2015 09/05/2015 09/05/2015         1:37 AM  7:10 AM  2:05 PM  Troponin I     <0.031 ng/mL 0.07 (H) 0.31 (H) 0.29 (H)      Please exercise your independent,  professional judgment when responding. A specific answer is not anticipated or expected.   Thank You, Erling Conte  RN BSN CCDS [phone] [facility] [name]

## 2015-09-07 NOTE — Progress Notes (Signed)
Inpatient Diabetes Program Recommendations  AACE/ADA: New Consensus Statement on Inpatient Glycemic Control (2015)  Target Ranges:  Prepandial:   less than 140 mg/dL      Peak postprandial:   less than 180 mg/dL (1-2 hours)      Critically ill patients:  140 - 180 mg/dL  Review of Glycemic control Results for Karla, Price (MRN [id number]) as of 09/07/2015 12:34  Ref. Range 09/06/2015 11:21 09/06/2015 16:21 09/06/2015 22:17 09/07/2015 06:21 09/07/2015 11:17  Glucose-Capillary Latest Ref Range: 65-99 mg/dL 430 (H) 381 (H) 271 (H) 252 (H) 303 (H)   Diabetes history: Type 2 diabetes  Outpatient Diabetes medications: Lantus 60 units daily at hs, Novolog 6-8 or 10 units pre-meals based on a sliding scale she was given at [place] a few months ago, Glipizide 20 mg daily  Current orders for Inpatient glycemic control: Novolog moderate tid with meals and HS, Novolog 8 units tid with meals,  Lantus 70 units daily. A1C 12.8%.  Inpatient Diabetes Program Recommendations:   Please consider increasing Novolog meal coverage to 15 units tid with meals- continue Novolog correction as ordered.   Patient tells me she no longer gets meds from [facility], she gets them from [facility] and is scheduled to see Dr. Sandi Mariscal, [phone] on October 02, 2015    I have instructed the patient to divide her Lantus dose in half (give it at the same time but in 2 different locations).  Suggested she use her abdomen for insulin administration.   When patient is discharged please re-examine her pre-admission sliding scale - it likely needs to be increased as A1C is 12.8% and she denies skipping her insulin.  Gentry Fitz, RN, BA, MHA, CDE Diabetes Coordinator Inpatient Diabetes Program  [phone] (Team Pager) [phone] ([place]) 09/07/2015 1:09 PM

## 2015-09-07 NOTE — Discharge Summary (Signed)
Physician Discharge Summary  [name] [id number] DOB: 26-Feb-1961 DOA: 09/04/2015  PCP: Lynne Logan, MD  Admit date: 09/04/2015 Discharge date: 09/08/2015  Time spent: 35 minutes  Recommendations for Outpatient Follow-up:  1. Needs better outpatient glucose control 2. Outpatient sleep study  Discharge Diagnoses:  Principal Problem:   Atrial fibrillation with RVR ([place]) Active Problems:   DM (diabetes mellitus), type 2 with peripheral vascular complications ([name])   Hypothyroidism   Chronic diastolic CHF (congestive heart failure) ([name])   COPD (chronic obstructive pulmonary disease) ([name])   Chest pain   Coronary artery disease involving native coronary artery of native heart   Discharge Condition: improved  Diet recommendation: diabetic  Filed Weights   09/04/15 2142 09/05/15 0145  Weight: 102.059 kg (225 lb) 103 kg (227 lb 1.2 oz)    History of present illness:  [name] is a 54 y.o. female with uncontrolled DM, h/o paroxysmal A.fib on Xeralto. Patient presents to the ED with c/o palpitations, chest tightness, R shoulder pain. Symptoms onset this evening after eating dinner. She has taken all of her usual meds today. No recent illness.  Per EMS initially HR A.Fib in the 190s, improved slightly with cardizem. Patient now going 140s in ED on cardizem gtt, chest pain has improved with rate control.  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- outpatient work up  Procedures:    Consultations: Cardiology  Discharge Exam: Filed Vitals:   09/07/15 0800  BP: 177/84  Pulse: 97  Temp:   Resp:     General: awake, NAD   Discharge Instructions   Discharge Instructions    Diet - low sodium heart healthy    Complete by:  As directed      Diet Carb Modified    Complete by:  As directed      Discharge instructions    Complete by:  As directed   Keep track of blood sugars and bring to PCP BMP 1 week     Increase activity slowly    Complete by:  As directed           Discharge Medication List as of 09/07/2015  1:33 PM    START taking these medications   Details  insulin glargine (LANTUS) 100 UNIT/ML injection Inject 0.7 mLs (70 Units total) into the skin every morning., Starting 09/07/2015, Until Discontinued, No Print    traMADol (ULTRAM) 50 MG tablet Take 1 tablet (50 mg total) by mouth every 12 (twelve) hours as needed for moderate pain., Starting 09/07/2015, Until Discontinued, Print      CONTINUE these medications which have CHANGED   Details  carvedilol (COREG) 12.5 MG tablet Take 3 tablets (37.5 mg total) by mouth 2 (two) times daily with a meal., Starting 09/07/2015, Until Discontinued, Print    furosemide (LASIX) 40 MG tablet Take 1 tablet (40 mg total) by mouth 2 (two) times daily., Starting 09/07/2015, Until Discontinued, Print  CONTINUE these medications which have NOT CHANGED   Details  albuterol (PROVENTIL HFA;VENTOLIN HFA) 108 (90 BASE) MCG/ACT inhaler Inhale 2 puffs into the lungs every 6 (six) hours as needed for wheezing or shortness of breath., Starting 06/30/2015, Until Discontinued, Normal    albuterol (PROVENTIL) (2.5 MG/3ML) 0.083% nebulizer solution Take 3 mLs (2.5 mg total) by nebulization every 6 (six) hours as needed. For wheezing., Starting 06/30/2015, Until Discontinued, Normal    atorvastatin (LIPITOR)  40 MG tablet Take 1 tablet (40 mg total) by mouth daily., Starting 06/30/2015, Until Discontinued, Normal    beta carotene w/minerals (OCUVITE) tablet Take 1 tablet by mouth daily., Until Discontinued, Historical Med    Blood Glucose Monitoring Suppl (ACCU-CHEK AVIVA PLUS) W/DEVICE KIT 1 Device by Does not apply route 4 (four) times daily -  before meals and at bedtime., Starting 07/21/2015, Until Discontinued, Normal    Cholecalciferol (VITAMIN D PO) Take 1 capsule by mouth daily., Until Discontinued, Historical Med    clopidogrel (PLAVIX) 75 MG tablet Take 1 tablet (75 mg total) by mouth daily with breakfast., Starting 06/30/2015, Until Discontinued, Print    cyclobenzaprine (FLEXERIL) 10 MG tablet Take 1 tablet (10 mg total) by mouth 3 (three) times daily as needed for muscle spasms., Starting 07/21/2015, Until Discontinued, Normal    Gabapentin, Once-Daily, (GRALISE) 300 MG TABS Take 1 tablet by mouth 4 (four) times daily., Until Discontinued, Historical Med    glipiZIDE (GLUCOTROL XL) 10 MG 24 hr tablet Take 2 tablets (20 mg total) by mouth daily with breakfast., Starting 06/30/2015, Until Discontinued, Normal    insulin aspart (NOVOLOG) 100 UNIT/ML FlexPen Sliding scale, Normal    levothyroxine (SYNTHROID, LEVOTHROID) 50 MCG tablet Take 1 tablet (50 mcg total) by mouth daily., Starting 06/30/2015, Until Discontinued, Normal    lisinopril (PRINIVIL,ZESTRIL) 20 MG tablet Take 1 tablet (20 mg total) by mouth daily., Starting 06/30/2015, Until Discontinued, Normal    nitroGLYCERIN (NITROSTAT) 0.4 MG SL tablet Place 1 tablet (0.4 mg total) under the tongue every 5 (five) minutes as needed for chest pain., Starting 01/16/2015, Until Discontinued, Print    pantoprazole (PROTONIX) 40 MG tablet Take 1 tablet (40 mg total) by mouth daily., Starting 06/30/2015, Until Wed 06/29/16, Normal    rivaroxaban (XARELTO) 20 MG TABS tablet Take 1 tablet (20 mg total) by mouth daily with supper., Starting 06/30/2015,  Until Discontinued, Normal    glucose blood (ACCU-CHEK AVIVA PLUS) test strip Use as instructed, Normal    Insulin Pen Needle (ULTICARE MICRO PEN NEEDLES) 32G X 4 MM MISC 1 Syringe by Does not apply route 4 (four) times daily - after meals and at bedtime., Starting 06/30/2015, Until Discontinued, Normal    Lancets (ACCU-CHEK SOFT TOUCH) lancets Use as instructed, Normal      STOP taking these medications     Insulin Glargine (LANTUS) 100 UNIT/ML Solostar Pen        Allergies  Allergen Reactions  . Amiodarone Nausea And Vomiting   Follow-up Information    Please follow up.   Why:  As needed       The results of significant diagnostics from this hospitalization (including imaging, microbiology, ancillary and laboratory) are listed below for reference.    Significant Diagnostic Studies: Dg Chest Port 1 View  09/04/2015  CLINICAL DATA:  Chest pain and palpitations. EXAM: PORTABLE CHEST 1 VIEW COMPARISON:  Frontal and lateral views 06/23/2015, chest CT 07/04/2015 FINDINGS: The cardiomediastinal contours are unchanged with heart at the upper limits of normal in  size in atherosclerosis of the thoracic aorta. Pulmonary vasculature is normal. No consolidation, pleural effusion, or pneumothorax. No acute osseous abnormalities are seen. IMPRESSION: Borderline cardiomegaly, unchanged.  No acute pulmonary process. Electronically Signed   By: Jeb Levering M.D.   On: 09/04/2015 23:03    Microbiology: Recent Results (from the past 240 hour(s))  Urine culture     Status: None   Collection Time: 09/05/15 12:13 AM  Result Value Ref Range Status   Specimen Description URINE, RANDOM  Final   Special Requests NONE  Final   Culture MULTIPLE SPECIES PRESENT, SUGGEST RECOLLECTION  Final   Report Status 09/06/2015 FINAL  Final  MRSA PCR Screening     Status: None   Collection Time: 09/05/15  2:21 AM  Result Value Ref Range Status   MRSA by PCR NEGATIVE NEGATIVE Final    Comment:        The  GeneXpert MRSA Assay (FDA approved for NASAL specimens only), is one component of a comprehensive MRSA colonization surveillance program. It is not intended to diagnose MRSA infection nor to guide or monitor treatment for MRSA infections.      Labs: Basic Metabolic Panel:  Recent Labs Lab 09/04/15 2245 09/05/15 0131 09/06/15 0319 09/07/15 0006  NA 130* 134* 134* 134*  K 4.1 4.4 4.1 4.1  CL 89* 97* 101 100*  CO2 _0 GLUCOSE 474* 416* 279* 241*  BUN 34* 38* 29* 20  CREATININE 1.27* 1.23* 0.97 0.86  CALCIUM 8.8* 8.5* 8.5* 8.8*   Liver Function Tests: No results for input(s): AST, ALT, ALKPHOS, BILITOT, PROT, ALBUMIN in the last 168 hours. No results for input(s): LIPASE, AMYLASE in the last 168 hours. No results for input(s): AMMONIA in the last 168 hours. CBC:  Recent Labs Lab 09/04/15 2245 09/05/15 0131 09/06/15 0319 09/07/15 0006  WBC 12.2* 11.9* 9.8 9.3  NEUTROABS 8.6*  --   --   --   HGB 11.2* 10.4* 9.7* 9.8*  HCT 33.5* 32.4* 30.5* 30.9*  MCV 86.8 87.3 87.9 88.0  PLT 318 316 261 275   Cardiac Enzymes:  Recent Labs Lab 09/05/15 0137 09/05/15 0710 09/05/15 1405  TROPONINI 0.07* 0.31* 0.29*   BNP: BNP (last 3 results)  Recent Labs  04/29/15 1553 06/14/15 0500 06/23/15 0005  BNP 1483.5* 1044.2* 539.4*    ProBNP (last 3 results)  Recent Labs  10/29/14 0222  PROBNP 1070.0*    CBG:  Recent Labs Lab 09/06/15 1121 09/06/15 1621 09/06/15 2217 09/07/15 0621 09/07/15 1117  GLUCAP 430* 381* 271* 252* 303*       Signed:  Jemina Scahill  [facility] 09/08/2015, 3:54 PM

## 2015-09-09 ENCOUNTER — Ambulatory Visit (HOSPITAL_COMMUNITY): Payer: Medicaid Other

## 2015-09-11 ENCOUNTER — Other Ambulatory Visit: Payer: Self-pay | Admitting: Family Medicine

## 2015-09-11 ENCOUNTER — Ambulatory Visit (HOSPITAL_COMMUNITY): Payer: Medicaid Other

## 2015-09-14 ENCOUNTER — Ambulatory Visit (HOSPITAL_COMMUNITY): Payer: Medicaid Other

## 2015-09-14 NOTE — Progress Notes (Signed)
In response to CHADS-VASc query: patients CHADS-Vasc score at the time of my H&P was a 5 (and so [name] was continued).

## 2015-09-15 ENCOUNTER — Telehealth (HOSPITAL_COMMUNITY): Payer: Self-pay | Admitting: Cardiac Rehabilitation

## 2015-09-15 NOTE — Telephone Encounter (Signed)
Cardiac rehab appointments cancelled pending better glycemic control and ED office follow up appt.  Pt reports her fasting CBG is 100s, however her post meal CBG-200-300s.  Pt has appt with Dr. [name] 09/21/15 and Dr. Gwenlyn Found 09/29/15.

## 2015-09-16 ENCOUNTER — Ambulatory Visit (HOSPITAL_BASED_OUTPATIENT_CLINIC_OR_DEPARTMENT_OTHER): Payer: Medicaid Other | Attending: Physician Assistant | Admitting: Radiology

## 2015-09-16 ENCOUNTER — Ambulatory Visit (HOSPITAL_COMMUNITY): Payer: Medicaid Other

## 2015-09-16 VITALS — Ht 64.0 in | Wt 224.0 lb

## 2015-09-17 ENCOUNTER — Ambulatory Visit (HOSPITAL_COMMUNITY): Payer: Medicaid Other

## 2015-09-17 NOTE — Sleep Study (Signed)
The patient presented to the [facility] for a Split night study with a chief complaint of Paroxysmal Atrial Fibrillation. The patient entered the center stating she felt funny and thought her blood pressure was elevated. The patient stated she suffers from diabetes and that her glucose has been ranging low. The patient also stated she is insulin dependent and takes her insulin 4 times per day. At 1900, the patient stated she self administered 8 units of insulin and was supposed to take 100 units at bedtime. The patient was asked if her insulin was brought with her, but the patient stated she forgot to bring it with her. The technologist requested the patient check her glucose with her glucometer for a baseline reading. The patient stated her glucose was 354mmHg. With the patient's glucose at a high level and the fact of the patient not having her insulin, the technologist decided it would be best if the patient rescheduled the sleep study.

## 2015-09-18 ENCOUNTER — Ambulatory Visit (HOSPITAL_COMMUNITY): Payer: Medicaid Other

## 2015-09-21 ENCOUNTER — Ambulatory Visit (HOSPITAL_COMMUNITY): Payer: Medicaid Other

## 2015-09-22 ENCOUNTER — Other Ambulatory Visit: Payer: Self-pay

## 2015-09-22 MED ORDER — PANTOPRAZOLE SODIUM 40 MG PO TBEC: 40.0000 mg | DELAYED_RELEASE_TABLET | Freq: Every day | ORAL | Status: DC

## 2015-09-23 ENCOUNTER — Ambulatory Visit (HOSPITAL_COMMUNITY): Payer: Medicaid Other

## 2015-09-25 ENCOUNTER — Ambulatory Visit (HOSPITAL_COMMUNITY): Payer: Medicaid Other

## 2015-09-28 ENCOUNTER — Ambulatory Visit (HOSPITAL_COMMUNITY): Payer: Medicaid Other

## 2015-09-29 ENCOUNTER — Telehealth (HOSPITAL_COMMUNITY): Payer: Self-pay | Admitting: Cardiac Rehabilitation

## 2015-09-29 ENCOUNTER — Encounter: Payer: Self-pay | Admitting: Cardiovascular Disease

## 2015-09-29 ENCOUNTER — Ambulatory Visit (INDEPENDENT_AMBULATORY_CARE_PROVIDER_SITE_OTHER): Payer: Medicare Other | Admitting: Cardiovascular Disease

## 2015-09-29 VITALS — BP 154/62 | HR 91 | Ht 64.0 in | Wt 224.0 lb

## 2015-09-29 DIAGNOSIS — I248 Other forms of acute ischemic heart disease: Secondary | ICD-10-CM | POA: Diagnosis not present

## 2015-09-29 DIAGNOSIS — I779 Disorder of arteries and arterioles, unspecified: Secondary | ICD-10-CM | POA: Diagnosis not present

## 2015-09-29 DIAGNOSIS — I6523 Occlusion and stenosis of bilateral carotid arteries: Secondary | ICD-10-CM | POA: Diagnosis not present

## 2015-09-29 DIAGNOSIS — I429 Cardiomyopathy, unspecified: Secondary | ICD-10-CM | POA: Diagnosis not present

## 2015-09-29 DIAGNOSIS — I739 Peripheral vascular disease, unspecified: Principal | ICD-10-CM

## 2015-09-29 NOTE — Assessment & Plan Note (Addendum)
History of ischemic cardiomyopathy with an EF of 40-45% by 2-D echo performed in December 2015

## 2015-09-29 NOTE — Patient Instructions (Signed)
Medication Instructions:  Your physician recommends that you continue on your current medications as directed. Please refer to the Current Medication list given to you today.   Labwork: I will get your lab work from your Primary Care Physician.   Testing/Procedures: Your physician has requested that you have a carotid duplex. This test is an ultrasound of the carotid arteries in your neck. It looks at blood flow through these arteries that supply the brain with blood. Allow one hour for this exam. There are no restrictions or special instructions. MAY 2017    Follow-Up: Your physician wants you to follow-up in: 12 months with Dr. Gwenlyn Found. You will receive a reminder letter in the mail two months in advance. If you don't receive a letter, please call our office to schedule the follow-up appointment.   Any Other Special Instructions Will Be Listed Below (If Applicable).     If you need a refill on your cardiac medications before your next appointment, please call your pharmacy.

## 2015-09-29 NOTE — Assessment & Plan Note (Signed)
History of chronic catheterization performed 10/31/14 revealing moderate CAD treated medically. Her EF at that time was 30-35%

## 2015-09-29 NOTE — Telephone Encounter (Signed)
pc to pt to confirm cardiac rehab orientation appt scheduled 10/01/15.  Pt reports home CBG <200 over past week.  Medical records received from Dr. [name].

## 2015-09-29 NOTE — Assessment & Plan Note (Signed)
History of carotid artery disease status post angiography performed 01/15/15 revealing an occluded right internal carotid and high-grade left shoulder and underwent uncomplicated left carotid endarterectomy by Dr. Trula Slade  02/19/15. Her follow-up Doppler study in our office 03/31/15 revealed her endarterectomy site to be widely patent.

## 2015-09-29 NOTE — Assessment & Plan Note (Signed)
History of peripheral arterial disease status post right SFA intervention 09/02/13 for critical limb ischemia with re-intervention of her right lower extremity 01/15/15. She does have one vessel runoff bilaterally with a known occluded left SFA. She does have continued lifestyle limiting claudication.

## 2015-09-29 NOTE — Progress Notes (Signed)
   09/29/2015 [name]   10/29/1961  [id number]  Primary Physician SUN,VYVYAN Y, MD Primary Cardiologist: Karla J. Berry MD FACP,FACC,FAHA, FSCAI    HPI: [name] is a 54-year-old moderately overweight single Caucasian female mother of one child who lives with her sister. She was referred by Dr. Richard Price at [facility] for peripheral vascular evaluation because of critical limb ischemia. I last saw her in the office /27/16. She sees Dr. Peter Price at [facility] for cardiomyopathy and paroxysmal atrial fibrillation. Her cardiac risk factors include a 20-pack-year history of tobacco abuse (she has stopped smoking), treated diabetes, and hypertension as well as family history of heart disease. She's never had a heart attack or stroke. She does have COPD. She had paroxysmal atrial fibrillation in the past and has undergone cardioversion in [name].. The lower extremity arterial Doppler studies performed in our office suggested critical limb ischemia with an occluded SFA and popliteal arteries bilaterally with tibial vessel disease as well. She complains of lifestyle limiting claudication. I performed lower extremity angiography on 09/10/13 revealing occluded SFAs bilaterally with chronic occluded tibial vessels as well. I performed TurboHawk directional atherectomy of her entire right SFA with an excellent angiographic result removing a copious amount of atherosclerotic plaque. The ulcer on her right heel ultimately healed. She does continue to smoke one pack per day. She was recently admitted to [name] because of atrial fibrillation with rapid ventricular response. She underwent cardiac catheterization on 10/31/14 revealing an ejection fraction of 30-35% with disease that was ultimately treated medically. Her carotid Doppler showed high-grade bilateral ICA disease and lower extremity Dopplers revealed restenosis within the right SFA with a ABI of 0.34. She is  symptomatic on that side. I performed angiography on her 01/15/15 revealing an occluded right internal carotid artery, 95% proximal left internal carotid artery stenosis, long 99% stenosis within the right SFA and an occluded left SFA. I performed Hawk 1 directional atherectomy and drug-eluting balloon angioplasty of her right SFA with an excellent Angiographic and clinical result. She underwent left carotid endarterectomy by [name] on 02/19/15 which was uncomplicated. Since I saw her 6 months ago she's been remaining clinically stable. She denies chest pain or shortness of breath. She continues to have claudication.Lower extremity Doppler studies performed 01/23/15 revealed ABIs in the 0.6 range bilaterally with a patent right SFA   Current Outpatient Prescriptions  Medication Sig Dispense Refill  . albuterol (PROVENTIL HFA;VENTOLIN HFA) 108 (90 BASE) MCG/ACT inhaler Inhale 2 puffs into the lungs every 6 (six) hours as needed for wheezing or shortness of breath. 1 each 3  . albuterol (PROVENTIL) (2.5 MG/3ML) 0.083% nebulizer solution Take 3 mLs (2.5 mg total) by nebulization every 6 (six) hours as needed. For wheezing. 75 mL 2  . atorvastatin (LIPITOR) 40 MG tablet Take 1 tablet (40 mg total) by mouth daily. 30 tablet 2  . beta carotene w/minerals (OCUVITE) tablet Take 1 tablet by mouth daily.    . Blood Glucose Monitoring Suppl (ACCU-CHEK AVIVA PLUS) W/DEVICE KIT 1 Device by Does not apply route 4 (four) times daily -  before meals and at bedtime. 1 kit 0  . carvedilol (COREG) 12.5 MG tablet Take 3 tablets (37.5 mg total) by mouth 2 (two) times daily with a meal. 60 tablet 0  . Cholecalciferol (VITAMIN D PO) Take 1 capsule by mouth daily.    . clopidogrel (PLAVIX) 75 MG tablet Take 1 tablet (75 mg total) by mouth daily   with breakfast. 30 tablet 6  . cyclobenzaprine (FLEXERIL) 10 MG tablet Take 1 tablet (10 mg total) by mouth 3 (three) times daily as needed for muscle spasms. 30 tablet 0  .  furosemide (LASIX) 40 MG tablet Take 1 tablet (40 mg total) by mouth 2 (two) times daily. 90 tablet 2  . Gabapentin, Once-Daily, (GRALISE) 300 MG TABS Take 1 tablet by mouth 4 (four) times daily.    [name] glipiZIDE (GLUCOTROL XL) 10 MG 24 hr tablet Take 2 tablets (20 mg total) by mouth daily with breakfast. 60 tablet 3  . glucose blood (ACCU-CHEK AVIVA PLUS) test strip Use as instructed 100 each 12  . insulin aspart (NOVOLOG) 100 UNIT/ML FlexPen Sliding scale (Patient taking differently: Inject 2-8 Units into the skin 3 (three) times daily with meals. Sliding scale) 15 mL 11  . insulin glargine (LANTUS) 100 UNIT/ML injection Inject 0.7 mLs (70 Units total) into the skin every morning. 10 mL 11  . Insulin Pen Needle (ULTICARE MICRO PEN NEEDLES) 32G X 4 MM MISC 1 Syringe by Does not apply route 4 (four) times daily - after meals and at bedtime. 1 each 11  . Lancets (ACCU-CHEK SOFT TOUCH) lancets Use as instructed 100 each 12  . levothyroxine (SYNTHROID, LEVOTHROID) 50 MCG tablet Take 1 tablet (50 mcg total) by mouth daily. 30 tablet 2  . lisinopril (PRINIVIL,ZESTRIL) 20 MG tablet Take 1 tablet (20 mg total) by mouth daily. 30 tablet 2  . nitroGLYCERIN (NITROSTAT) 0.4 MG SL tablet Place 1 tablet (0.4 mg total) under the tongue every 5 (five) minutes as needed for chest pain. 25 tablet 3  . pantoprazole (PROTONIX) 40 MG tablet Take 1 tablet (40 mg total) by mouth daily. 30 tablet 2  . rivaroxaban (XARELTO) 20 MG TABS tablet Take 1 tablet (20 mg total) by mouth daily with supper. 30 tablet 2  . traMADol (ULTRAM) 50 MG tablet Take 1 tablet (50 mg total) by mouth every 12 (twelve) hours as needed for moderate pain. 20 tablet 0   No current facility-administered medications for this visit.    Allergies  Allergen Reactions  . Amiodarone Nausea And Vomiting    Social History   Social History  . Marital Status: Divorced    Spouse Name: N/A  . Number of Children: N/A  . Years of Education: N/A    Occupational History  . disabled    Social History Main Topics  . Smoking status: Current Every Day Smoker -- 0.00 packs/day for 40 years    Types: E-cigarettes    Last Attempt to Quit: 02/19/2015  . Smokeless tobacco: Never Used  . Alcohol Use: No  . Drug Use: No     Comment: 04/29/2015 "last drug use was ~ 09/08/2013"  . Sexual Activity: No   Other Topics Concern  . Not on file   Social History Narrative     Review of Systems: General: negative for chills, fever, night sweats or weight changes.  Cardiovascular: negative for chest pain, dyspnea on exertion, edema, orthopnea, palpitations, paroxysmal nocturnal dyspnea or shortness of breath Dermatological: negative for rash Respiratory: negative for cough or wheezing Urologic: negative for hematuria Abdominal: negative for nausea, vomiting, diarrhea, bright red blood per rectum, melena, or hematemesis Neurologic: negative for visual changes, syncope, or dizziness All other systems reviewed and are otherwise negative except as noted above.    Blood pressure 154/62, pulse 91, height 5' 4" (1.626 m), weight 224 lb (101.606 kg), last menstrual period 11/27/2012.  General  appearance: alert Neck: no adenopathy, no carotid bruit, no JVD, supple, symmetrical, trachea midline and thyroid not enlarged, symmetric, no tenderness/mass/nodules Lungs: clear to auscultation bilaterally Heart: regular rate and rhythm, S1, S2 normal, no murmur, click, rub or gallop Extremities: extremities normal, atraumatic, no cyanosis or edema  EKG /91 with inferior and lateral ST segment depression and T-wave inversion. I personally reviewed this EKG  ASSESSMENT AND PLAN:   Coronary artery disease involving native coronary artery of native heart History of chronic catheterization performed 10/31/14 revealing moderate CAD treated medically. Her EF at that time was 30-35%  Claudication ([name]) History of peripheral arterial disease status post right  SFA intervention 09/02/13 for critical limb ischemia with re-intervention of her right lower extremity 01/15/15. She does have one vessel runoff bilaterally with a known occluded left SFA. She does have continued lifestyle limiting claudication.  Carotid stenosis History of carotid artery disease status post angiography performed 01/15/15 revealing an occluded right internal carotid and high-grade left shoulder and underwent uncomplicated left carotid endarterectomy by [name]  02/19/15. Her follow-up Doppler study in our office 03/31/15 revealed her endarterectomy site to be widely patent.  Cardiomyopathy History of ischemic cardiomyopathy with an EF of 40-45% by 2-D echo performed in December 2015      Karla J. Berry MD FACP,FACC,FAHA, FSCAI 09/29/2015 11:49 AM  

## 2015-09-30 ENCOUNTER — Encounter: Payer: Self-pay | Admitting: Cardiovascular Disease

## 2015-09-30 ENCOUNTER — Ambulatory Visit (HOSPITAL_COMMUNITY): Payer: Medicaid Other

## 2015-10-01 ENCOUNTER — Encounter: Payer: Self-pay | Admitting: Cardiovascular Disease

## 2015-10-01 ENCOUNTER — Ambulatory Visit (HOSPITAL_COMMUNITY): Payer: Medicaid Other

## 2015-10-02 ENCOUNTER — Ambulatory Visit (HOSPITAL_COMMUNITY): Payer: Medicaid Other

## 2015-10-05 ENCOUNTER — Encounter (HOSPITAL_COMMUNITY): Payer: Medicare Other

## 2015-10-05 ENCOUNTER — Ambulatory Visit (HOSPITAL_COMMUNITY): Payer: Medicaid Other

## 2015-10-07 ENCOUNTER — Encounter (HOSPITAL_COMMUNITY)
Admission: RE | Admit: 2015-10-07 | Discharge: 2015-10-07 | Disposition: A | Discharge status: Home / Self Care | Payer: Medicare Other | Source: Ambulatory Visit | Attending: Cardiovascular Disease | Admitting: Cardiovascular Disease

## 2015-10-07 ENCOUNTER — Ambulatory Visit (HOSPITAL_COMMUNITY): Payer: Medicaid Other

## 2015-10-07 ENCOUNTER — Encounter (HOSPITAL_COMMUNITY): Payer: Self-pay

## 2015-10-07 DIAGNOSIS — I429 Cardiomyopathy, unspecified: Secondary | ICD-10-CM | POA: Insufficient documentation

## 2015-10-07 LAB — GLUCOSE, CAPILLARY
GLUCOSE-CAPILLARY: 106 mg/dL — AB (ref 65–99)
GLUCOSE-CAPILLARY: 122 mg/dL — AB (ref 65–99)

## 2015-10-07 NOTE — Progress Notes (Addendum)
Pt started cardiac rehab today.  Pt tolerated light exercise without difficulty. VSS, telemetry-sinus rhythm, non specific ST T wave changes, asymptomatic.  Medication list reconciled.  Pt has poor knowledge of prescribed medication regimen.  Pt with recent poorly controlled diabetes, working with PCP to stabilize.  Pt verbalized compliance with medications and barriers to compliance include knowledge deficit, financial access to medications.   PSYCHOSOCIAL ASSESSMENT:  PHQ-0. Pt exhibits positive coping skills, hopeful outlook with supportive family.  Pt does have financial difficulties with known food disparities in her home.  Barriers to rehab participation include claudication, diabetic control and transportation issues.  Pt is registered with [facility] transportation services for rides to cardiac rehab. Pt enjoys playing online phone games and watching TV.   Pt cardiac rehab  goal is  to walk more, improve health, strength and stamina.  Pt encouraged to participate in home exercise in addition to cardiac rehab activities and risk factor education classes  to increase ability to achieve these goals.  Pt oriented to exercise equipment and routine.  Understanding verbalized.

## 2015-10-12 ENCOUNTER — Telehealth: Payer: Self-pay | Admitting: Cardiovascular Disease

## 2015-10-12 ENCOUNTER — Encounter (HOSPITAL_COMMUNITY)
Admission: RE | Admit: 2015-10-12 | Discharge: 2015-10-12 | Disposition: A | Discharge status: Home / Self Care | Payer: Medicare Other | Source: Ambulatory Visit | Attending: Cardiovascular Disease | Admitting: Cardiovascular Disease

## 2015-10-12 ENCOUNTER — Ambulatory Visit (HOSPITAL_COMMUNITY): Payer: Medicaid Other

## 2015-10-12 DIAGNOSIS — I5032 Chronic diastolic (congestive) heart failure: Secondary | ICD-10-CM

## 2015-10-12 DIAGNOSIS — I429 Cardiomyopathy, unspecified: Secondary | ICD-10-CM | POA: Diagnosis not present

## 2015-10-12 LAB — GLUCOSE, CAPILLARY: GLUCOSE-CAPILLARY: 169 mg/dL — AB (ref 65–99)

## 2015-10-12 NOTE — Progress Notes (Signed)
Pt weight up 2.2kg at cardiac rehab today.  On Wednesday, 10/07/15 weight 102.0kg, today 104.2kg.  Last OV with Dr. Gwenlyn Found weight 101.6kg. Pt does not weigh herself at home, her scale is broken. Pt does admit to increased caloric and sodium intake over Thanksgiving holiday. Pt asymptomatic, O2 sat-96%,  lungs clear, trace-1+ edema bilaterally, left greater than right.  PC to Dr. Kennon Holter nurse to advise. Malachy Mood will communicate with Dr. Gwenlyn Found and notify pt of recommendation.  Pt instructed to follow strict low sodium diet.  Understanding verbalized.

## 2015-10-12 NOTE — Telephone Encounter (Signed)
Returned call to patient spoke to DOD Dr.Crenshaw he advised to increase Lasix to 80 mg am,40 mg pm for 2 days only then return to normal dose 40 mg twice a day.Bmet in 1 week.

## 2015-10-12 NOTE — Telephone Encounter (Signed)
Received a call from [place] with Cardiac Rehab.She was calling to report patient has gained 5 lbs within 1 week. She has pitting edema in both lower legs.Left leg worse.No sob,vital signs stable,lungs clear.Advised Dr.Berry out of office will check with DOD Dr.Crenshaw.

## 2015-10-14 ENCOUNTER — Ambulatory Visit (HOSPITAL_COMMUNITY): Payer: Medicaid Other

## 2015-10-14 ENCOUNTER — Telehealth (HOSPITAL_COMMUNITY): Payer: Self-pay | Admitting: Family Medicine

## 2015-10-14 ENCOUNTER — Encounter (HOSPITAL_COMMUNITY): Payer: Medicare Other

## 2015-10-15 ENCOUNTER — Encounter (HOSPITAL_COMMUNITY): Payer: Self-pay

## 2015-10-15 ENCOUNTER — Observation Stay (HOSPITAL_COMMUNITY)
Admission: EM | Admit: 2015-10-15 | Discharge: 2015-10-17 | Disposition: A | Discharge status: Home / Self Care | Payer: Medicare Other | Attending: Cardiology | Admitting: Cardiology

## 2015-10-15 ENCOUNTER — Emergency Department (HOSPITAL_COMMUNITY): Payer: Medicare Other

## 2015-10-15 DIAGNOSIS — I248 Other forms of acute ischemic heart disease: Secondary | ICD-10-CM | POA: Insufficient documentation

## 2015-10-15 DIAGNOSIS — I472 Ventricular tachycardia: Secondary | ICD-10-CM | POA: Insufficient documentation

## 2015-10-15 DIAGNOSIS — I6521 Occlusion and stenosis of right carotid artery: Secondary | ICD-10-CM | POA: Diagnosis not present

## 2015-10-15 DIAGNOSIS — I480 Paroxysmal atrial fibrillation: Secondary | ICD-10-CM | POA: Diagnosis not present

## 2015-10-15 DIAGNOSIS — K7030 Alcoholic cirrhosis of liver without ascites: Secondary | ICD-10-CM | POA: Diagnosis present

## 2015-10-15 DIAGNOSIS — E1142 Type 2 diabetes mellitus with diabetic polyneuropathy: Secondary | ICD-10-CM | POA: Insufficient documentation

## 2015-10-15 DIAGNOSIS — M159 Polyosteoarthritis, unspecified: Secondary | ICD-10-CM | POA: Diagnosis not present

## 2015-10-15 DIAGNOSIS — Z7902 Long term (current) use of antithrombotics/antiplatelets: Secondary | ICD-10-CM | POA: Insufficient documentation

## 2015-10-15 DIAGNOSIS — Z79899 Other long term (current) drug therapy: Secondary | ICD-10-CM | POA: Insufficient documentation

## 2015-10-15 DIAGNOSIS — R748 Abnormal levels of other serum enzymes: Secondary | ICD-10-CM | POA: Diagnosis not present

## 2015-10-15 DIAGNOSIS — I10 Essential (primary) hypertension: Secondary | ICD-10-CM

## 2015-10-15 DIAGNOSIS — E1165 Type 2 diabetes mellitus with hyperglycemia: Secondary | ICD-10-CM | POA: Diagnosis not present

## 2015-10-15 DIAGNOSIS — J449 Chronic obstructive pulmonary disease, unspecified: Secondary | ICD-10-CM | POA: Diagnosis not present

## 2015-10-15 DIAGNOSIS — Z6839 Body mass index (BMI) 39.0-39.9, adult: Secondary | ICD-10-CM | POA: Diagnosis not present

## 2015-10-15 DIAGNOSIS — F17290 Nicotine dependence, other tobacco product, uncomplicated: Secondary | ICD-10-CM | POA: Diagnosis not present

## 2015-10-15 DIAGNOSIS — Z794 Long term (current) use of insulin: Secondary | ICD-10-CM | POA: Diagnosis not present

## 2015-10-15 DIAGNOSIS — I5043 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) and diastolic (congestive) heart failure: Secondary | ICD-10-CM | POA: Diagnosis present

## 2015-10-15 DIAGNOSIS — I429 Cardiomyopathy, unspecified: Secondary | ICD-10-CM | POA: Insufficient documentation

## 2015-10-15 DIAGNOSIS — F1410 Cocaine abuse, uncomplicated: Secondary | ICD-10-CM | POA: Insufficient documentation

## 2015-10-15 DIAGNOSIS — I481 Persistent atrial fibrillation: Secondary | ICD-10-CM | POA: Diagnosis not present

## 2015-10-15 DIAGNOSIS — I6529 Occlusion and stenosis of unspecified carotid artery: Secondary | ICD-10-CM | POA: Diagnosis present

## 2015-10-15 DIAGNOSIS — I2510 Atherosclerotic heart disease of native coronary artery without angina pectoris: Secondary | ICD-10-CM | POA: Diagnosis not present

## 2015-10-15 DIAGNOSIS — F1010 Alcohol abuse, uncomplicated: Secondary | ICD-10-CM | POA: Diagnosis present

## 2015-10-15 DIAGNOSIS — I214 Non-ST elevation (NSTEMI) myocardial infarction: Secondary | ICD-10-CM

## 2015-10-15 DIAGNOSIS — I110 Hypertensive heart disease with heart failure: Secondary | ICD-10-CM | POA: Diagnosis not present

## 2015-10-15 DIAGNOSIS — K219 Gastro-esophageal reflux disease without esophagitis: Secondary | ICD-10-CM | POA: Diagnosis not present

## 2015-10-15 DIAGNOSIS — F1210 Cannabis abuse, uncomplicated: Secondary | ICD-10-CM | POA: Insufficient documentation

## 2015-10-15 DIAGNOSIS — I4891 Unspecified atrial fibrillation: Secondary | ICD-10-CM | POA: Diagnosis not present

## 2015-10-15 DIAGNOSIS — I739 Peripheral vascular disease, unspecified: Secondary | ICD-10-CM | POA: Diagnosis present

## 2015-10-15 DIAGNOSIS — E1151 Type 2 diabetes mellitus with diabetic peripheral angiopathy without gangrene: Secondary | ICD-10-CM | POA: Diagnosis not present

## 2015-10-15 DIAGNOSIS — E039 Hypothyroidism, unspecified: Secondary | ICD-10-CM | POA: Diagnosis present

## 2015-10-15 DIAGNOSIS — Z86718 Personal history of other venous thrombosis and embolism: Secondary | ICD-10-CM | POA: Insufficient documentation

## 2015-10-15 DIAGNOSIS — Z7901 Long term (current) use of anticoagulants: Secondary | ICD-10-CM | POA: Diagnosis not present

## 2015-10-15 DIAGNOSIS — E785 Hyperlipidemia, unspecified: Secondary | ICD-10-CM

## 2015-10-15 DIAGNOSIS — I2489 Other forms of acute ischemic heart disease: Secondary | ICD-10-CM | POA: Diagnosis present

## 2015-10-15 DIAGNOSIS — I779 Disorder of arteries and arterioles, unspecified: Secondary | ICD-10-CM | POA: Diagnosis present

## 2015-10-15 DIAGNOSIS — R079 Chest pain, unspecified: Secondary | ICD-10-CM | POA: Diagnosis present

## 2015-10-15 LAB — CBC
HEMATOCRIT: 35.3 % — AB (ref 36.0–46.0)
HEMOGLOBIN: 11 g/dL — AB (ref 12.0–15.0)
MCH: 27.8 pg (ref 26.0–34.0)
MCHC: 31.2 g/dL (ref 30.0–36.0)
MCV: 89.4 fL (ref 78.0–100.0)
Platelets: 346 10*3/uL (ref 150–400)
RBC: 3.95 MIL/uL (ref 3.87–5.11)
RDW: 13.4 % (ref 11.5–15.5)
WBC: 12.8 10*3/uL — ABNORMAL HIGH (ref 4.0–10.5)

## 2015-10-15 LAB — COMPREHENSIVE METABOLIC PANEL
ALK PHOS: 277 U/L — AB (ref 38–126)
ALT: 99 U/L — ABNORMAL HIGH (ref 14–54)
ANION GAP: 11 (ref 5–15)
AST: 63 U/L — ABNORMAL HIGH (ref 15–41)
Albumin: 3.3 g/dL — ABNORMAL LOW (ref 3.5–5.0)
BILIRUBIN TOTAL: 0.4 mg/dL (ref 0.3–1.2)
BUN: 25 mg/dL — ABNORMAL HIGH (ref 6–20)
CALCIUM: 9.2 mg/dL (ref 8.9–10.3)
CO2: 31 mmol/L (ref 22–32)
Chloride: 94 mmol/L — ABNORMAL LOW (ref 101–111)
Creatinine, Ser: 1.12 mg/dL — ABNORMAL HIGH (ref 0.44–1.00)
GFR calc Af Amer: >60 mL/min (ref >60)
GFR, EST NON AFRICAN AMERICAN: 55 mL/min — AB (ref >60)
Glucose, Bld: 355 mg/dL — ABNORMAL HIGH (ref 65–99)
POTASSIUM: 3.8 mmol/L (ref 3.5–5.1)
Sodium: 136 mmol/L (ref 135–145)
TOTAL PROTEIN: 7.1 g/dL (ref 6.5–8.1)

## 2015-10-15 LAB — BRAIN NATRIURETIC PEPTIDE: B Natriuretic Peptide: 246.5 pg/mL — ABNORMAL HIGH (ref 0.0–100.0)

## 2015-10-15 LAB — ETHANOL: Alcohol, Ethyl (B): <5 mg/dL (ref <5)

## 2015-10-15 LAB — I-STAT TROPONIN, ED
TROPONIN I, POC: 0.04 ng/mL (ref 0.00–0.08)
TROPONIN I, POC: 1.83 ng/mL — AB (ref 0.00–0.08)

## 2015-10-15 LAB — RAPID URINE DRUG SCREEN, HOSP PERFORMED
AMPHETAMINES: NOT DETECTED
Barbiturates: NOT DETECTED
Benzodiazepines: NOT DETECTED
COCAINE: NOT DETECTED
OPIATES: NOT DETECTED
TETRAHYDROCANNABINOL: NOT DETECTED

## 2015-10-15 LAB — URINALYSIS, ROUTINE W REFLEX MICROSCOPIC
Bilirubin Urine: NEGATIVE
GLUCOSE, UA: 500 mg/dL — AB
Ketones, ur: NEGATIVE mg/dL
Leukocytes, UA: NEGATIVE
Nitrite: NEGATIVE
PH: 6 (ref 5.0–8.0)
PROTEIN: 100 mg/dL — AB
Specific Gravity, Urine: 1.01 (ref 1.005–1.030)

## 2015-10-15 LAB — PROTIME-INR
INR: 1.71 — ABNORMAL HIGH (ref 0.00–1.49)
PROTHROMBIN TIME: 20 s — AB (ref 11.6–15.2)

## 2015-10-15 LAB — CBG MONITORING, ED
GLUCOSE-CAPILLARY: 294 mg/dL — AB (ref 65–99)
Glucose-Capillary: 263 mg/dL — ABNORMAL HIGH (ref 65–99)
Glucose-Capillary: 215 mg/dL — ABNORMAL HIGH (ref 65–99)

## 2015-10-15 LAB — GLUCOSE, CAPILLARY
Glucose-Capillary: 198 mg/dL — ABNORMAL HIGH (ref 65–99)
Glucose-Capillary: 249 mg/dL — ABNORMAL HIGH (ref 65–99)
Glucose-Capillary: 314 mg/dL — ABNORMAL HIGH (ref 65–99)

## 2015-10-15 LAB — TSH: TSH: 2.196 u[IU]/mL (ref 0.350–4.500)

## 2015-10-15 LAB — URINE MICROSCOPIC-ADD ON

## 2015-10-15 LAB — APTT: aPTT: 33 seconds (ref 24–37)

## 2015-10-15 LAB — TROPONIN I
TROPONIN I: 1.89 ng/mL — AB (ref <0.031)
TROPONIN I: 2.28 ng/mL — AB (ref <0.031)

## 2015-10-15 LAB — HEPARIN LEVEL (UNFRACTIONATED): Heparin Unfractionated: 2.2 IU/mL — ABNORMAL HIGH (ref 0.30–0.70)

## 2015-10-15 LAB — MAGNESIUM: Magnesium: 1.4 mg/dL — ABNORMAL LOW (ref 1.7–2.4)

## 2015-10-15 MED ORDER — HEPARIN (PORCINE) IN NACL 100-0.45 UNIT/ML-% IJ SOLN
1600.0000 [IU]/h | INTRAMUSCULAR | Status: DC
  Administered 2015-10-15: 1200 [IU]/h via INTRAVENOUS
  Filled 2015-10-15: qty 250

## 2015-10-15 MED ORDER — CARVEDILOL 25 MG PO TABS
37.5000 mg | ORAL_TABLET | Freq: Two times a day (BID) | ORAL | Status: DC
  Administered 2015-10-15 – 2015-10-17 (×4): 37.5 mg via ORAL
  Filled 2015-10-15 (×4): qty 1

## 2015-10-15 MED ORDER — ASPIRIN 300 MG RE SUPP
300.0000 mg | RECTAL | Status: AC
Start: 2015-10-15 — End: 2015-10-15

## 2015-10-15 MED ORDER — PANTOPRAZOLE SODIUM 40 MG PO TBEC: 40.0000 mg | DELAYED_RELEASE_TABLET | Freq: Every day | ORAL | Status: DC

## 2015-10-15 MED ORDER — METOCLOPRAMIDE HCL 5 MG PO TABS: 5.0000 mg | ORAL_TABLET | Freq: Four times a day (QID) | ORAL | Status: DC | PRN

## 2015-10-15 MED ORDER — ACETAMINOPHEN-CODEINE #3 300-30 MG PO TABS
1.0000 | ORAL_TABLET | ORAL | Status: DC | PRN
  Administered 2015-10-15 – 2015-10-17 (×5): 1 via ORAL
  Filled 2015-10-15 (×6): qty 1

## 2015-10-15 MED ORDER — GLIPIZIDE ER 10 MG PO TB24
20.0000 mg | ORAL_TABLET | Freq: Every day | ORAL | Status: DC
  Administered 2015-10-16 – 2015-10-17 (×2): 20 mg via ORAL
  Filled 2015-10-15 (×3): qty 2

## 2015-10-15 MED ORDER — SODIUM CHLORIDE 0.9 % IV SOLN
INTRAVENOUS | Status: DC
  Administered 2015-10-15: 05:00:00 via INTRAVENOUS

## 2015-10-15 MED ORDER — CLOPIDOGREL BISULFATE 75 MG PO TABS
75.0000 mg | ORAL_TABLET | Freq: Every day | ORAL | Status: DC
  Administered 2015-10-16 – 2015-10-17 (×2): 75 mg via ORAL
  Filled 2015-10-15 (×2): qty 1

## 2015-10-15 MED ORDER — SODIUM CHLORIDE 0.9 % IV SOLN: 250.0000 mL | INTRAVENOUS | Status: DC | PRN

## 2015-10-15 MED ORDER — PANTOPRAZOLE SODIUM 40 MG PO TBEC
40.0000 mg | DELAYED_RELEASE_TABLET | Freq: Every day | ORAL | Status: DC
  Administered 2015-10-15 – 2015-10-17 (×3): 40 mg via ORAL
  Filled 2015-10-15 (×3): qty 1

## 2015-10-15 MED ORDER — SODIUM CHLORIDE 0.9 % IJ SOLN: 3.0000 mL | INTRAMUSCULAR | Status: DC | PRN

## 2015-10-15 MED ORDER — LISINOPRIL 10 MG PO TABS: 20.0000 mg | ORAL_TABLET | Freq: Every day | ORAL | Status: DC

## 2015-10-15 MED ORDER — GABAPENTIN 300 MG PO CAPS
300.0000 mg | ORAL_CAPSULE | Freq: Four times a day (QID) | ORAL | Status: DC
  Administered 2015-10-15 – 2015-10-17 (×8): 300 mg via ORAL
  Filled 2015-10-15 (×8): qty 1

## 2015-10-15 MED ORDER — INSULIN ASPART 100 UNIT/ML ~~LOC~~ SOLN
8.0000 [IU] | Freq: Once | SUBCUTANEOUS | Status: AC
  Administered 2015-10-15: 8 [IU] via SUBCUTANEOUS
  Filled 2015-10-15: qty 1

## 2015-10-15 MED ORDER — GABAPENTIN (ONCE-DAILY) 300 MG PO TABS: 1.0000 | ORAL_TABLET | Freq: Four times a day (QID) | ORAL | Status: DC

## 2015-10-15 MED ORDER — ATORVASTATIN CALCIUM 40 MG PO TABS
40.0000 mg | ORAL_TABLET | Freq: Every day | ORAL | Status: DC
  Administered 2015-10-15 – 2015-10-17 (×3): 40 mg via ORAL
  Filled 2015-10-15 (×3): qty 1

## 2015-10-15 MED ORDER — CARVEDILOL 25 MG PO TABS
25.0000 mg | ORAL_TABLET | Freq: Once | ORAL | Status: AC
  Administered 2015-10-15: 25 mg via ORAL
  Filled 2015-10-15: qty 1

## 2015-10-15 MED ORDER — ASPIRIN 81 MG PO CHEW
324.0000 mg | CHEWABLE_TABLET | Freq: Once | ORAL | Status: AC
  Administered 2015-10-15: 324 mg via ORAL
  Filled 2015-10-15: qty 4

## 2015-10-15 MED ORDER — ALUM & MAG HYDROXIDE-SIMETH 200-200-20 MG/5ML PO SUSP
15.0000 mL | ORAL | Status: DC | PRN
  Administered 2015-10-15: 15 mL via ORAL
  Filled 2015-10-15: qty 30

## 2015-10-15 MED ORDER — ASPIRIN EC 81 MG PO TBEC
81.0000 mg | DELAYED_RELEASE_TABLET | Freq: Every day | ORAL | Status: DC
  Administered 2015-10-16 – 2015-10-17 (×2): 81 mg via ORAL
  Filled 2015-10-15 (×3): qty 1

## 2015-10-15 MED ORDER — NITROGLYCERIN 0.4 MG SL SUBL: 0.4000 mg | SUBLINGUAL_TABLET | SUBLINGUAL | Status: DC | PRN

## 2015-10-15 MED ORDER — ACETAMINOPHEN 325 MG PO TABS
650.0000 mg | ORAL_TABLET | ORAL | Status: DC | PRN
  Administered 2015-10-15 – 2015-10-16 (×2): 650 mg via ORAL
  Filled 2015-10-15 (×2): qty 2

## 2015-10-15 MED ORDER — SODIUM CHLORIDE 0.9 % IJ SOLN
3.0000 mL | Freq: Two times a day (BID) | INTRAMUSCULAR | Status: DC
  Administered 2015-10-15 – 2015-10-17 (×4): 3 mL via INTRAVENOUS

## 2015-10-15 MED ORDER — LEVOTHYROXINE SODIUM 50 MCG PO TABS
50.0000 ug | ORAL_TABLET | Freq: Every day | ORAL | Status: DC
Start: 2015-10-15 — End: 2015-10-17
  Administered 2015-10-15 – 2015-10-17 (×3): 50 ug via ORAL
  Filled 2015-10-15 (×3): qty 1

## 2015-10-15 MED ORDER — LISINOPRIL 10 MG PO TABS
20.0000 mg | ORAL_TABLET | Freq: Every day | ORAL | Status: DC
  Administered 2015-10-15 – 2015-10-17 (×3): 20 mg via ORAL
  Filled 2015-10-15 (×3): qty 2

## 2015-10-15 MED ORDER — FUROSEMIDE 40 MG PO TABS
40.0000 mg | ORAL_TABLET | Freq: Two times a day (BID) | ORAL | Status: DC
  Administered 2015-10-15 – 2015-10-17 (×4): 40 mg via ORAL
  Filled 2015-10-15 (×4): qty 1

## 2015-10-15 MED ORDER — PROMETHAZINE HCL 12.5 MG PO TABS
12.5000 mg | ORAL_TABLET | Freq: Four times a day (QID) | ORAL | Status: DC | PRN
Start: 2015-10-15 — End: 2015-10-17
  Filled 2015-10-15: qty 1

## 2015-10-15 MED ORDER — INSULIN GLARGINE 100 UNIT/ML ~~LOC~~ SOLN
70.0000 [IU] | Freq: Every morning | SUBCUTANEOUS | Status: DC
  Administered 2015-10-15 – 2015-10-17 (×3): 70 [IU] via SUBCUTANEOUS
  Filled 2015-10-15 (×3): qty 0.7

## 2015-10-15 MED ORDER — ASPIRIN 81 MG PO CHEW
324.0000 mg | CHEWABLE_TABLET | ORAL | Status: AC
  Administered 2015-10-15: 324 mg via ORAL
  Filled 2015-10-15: qty 4

## 2015-10-15 MED ORDER — INSULIN ASPART 100 UNIT/ML ~~LOC~~ SOLN
0.0000 [IU] | Freq: Three times a day (TID) | SUBCUTANEOUS | Status: DC
  Administered 2015-10-15: 3 [IU] via SUBCUTANEOUS
  Administered 2015-10-15: 5 [IU] via SUBCUTANEOUS
  Administered 2015-10-16 (×2): 3 [IU] via SUBCUTANEOUS
  Administered 2015-10-16: 15 [IU] via SUBCUTANEOUS
  Administered 2015-10-17: 8 [IU] via SUBCUTANEOUS
  Administered 2015-10-17: 3 [IU] via SUBCUTANEOUS

## 2015-10-15 MED ORDER — ONDANSETRON HCL 4 MG/2ML IJ SOLN: 4.0000 mg | Freq: Four times a day (QID) | INTRAMUSCULAR | Status: DC | PRN

## 2015-10-15 NOTE — Progress Notes (Signed)
ANTICOAGULATION CONSULT NOTE - Initial Consult  Pharmacy Consult for heparin Indication: chest pain/ACS/afib  Allergies  Allergen Reactions  . Amiodarone Nausea And Vomiting    Patient Measurements: Height: 5\' 4"  (162.6 cm) Weight: 228 lb (103.42 kg) IBW/kg (Calculated) : 54.7 Heparin Dosing Weight: 78.9 kg  Vital Signs: Temp: 98.8 F (37.1 C) (12/01 0057) Temp Source: Oral (12/01 0057) BP: 147/91 mmHg (12/01 1100) Pulse Rate: 85 (12/01 1100)  Labs:  Recent Labs  10/15/15 0115  HGB 11.0*  HCT 35.3*  PLT 346  LABPROT 20.0*  INR 1.71*  CREATININE 1.12*    Estimated Creatinine Clearance: 68.8 mL/min (by C-G formula based on Cr of 1.12).   Medical History: Past Medical History  Diagnosis Date  . Alcohol abuse   . Narcotic abuse   . Cocaine abuse   . Marijuana abuse   . Tobacco abuse   . Alcoholic cirrhosis ([place])   . Cardiomyopathy ([place])   . Obesity   . Chronic systolic CHF (congestive heart failure) ([place])     a. Last echo 12/205: EF 40-45%, inferoapical/posterior HK, not technically sufficient to allow eval of LV diastolic function, normal LA in size..  . Hypothyroidism   . GERD (gastroesophageal reflux disease)   . PAF (paroxysmal atrial fibrillation) ([place])   . Critical lower limb ischemia   . Hypertension   . CAD (coronary artery disease)     a. cath 11/10/2014 small vessel CAD. Demand ischemia in the setting of rapid a-fib  . Pneumonia     END MARCH 2016  . Anxiety   . Wrist fracture     LEFT; "just did xray and cast"  . Poorly controlled type 2 diabetes mellitus ([place])   . Hyperlipemia   . Peripheral arterial disease ([place])     a. 01/2015 Angio/PTA: LSFA 100 w/ recon @ adductor canal and 1 vessel runoff via AT, RSFA 99 (atherectomy/pta) - 1 vessel runoff via diff dzs peroneal.  . Carotid artery disease ([place])     a. 01/2015 Carotid Angio: RICA [id number], LICA [id number]. s/p L carotid endarterectomy 02/2015.  [name] COPD (chronic obstructive pulmonary disease) ([place])   .  Arthritis     "hands, arms, shoulders, legs, back" (04/29/2015)  . Diabetic peripheral neuropathy ([name])     Medications:   (Not in a hospital admission)  Assessment: 54 yo female w/ PMH of PAD, afib, DM, HTN, hx DVT. Presented to ED in afib w/ RVR. Trop 0.04 > 1.83 (likely demand ischemia). Pt was on Xarelto PTA for afib/hx of clot (last dose 11/30 at ~1800). Pharmacy consulted to dose heparin for CP/STEMI/Afib.  Baseline HL and aPTT pending. Hgb 11, Plt 346  Goal of Therapy:  Heparin level 0.3-0.7 units/ml aPTT 66-102 seconds Monitor platelets by anticoagulation protocol: Yes   Plan:  - Start heparin infusion at 1200 units/hr at 1800 - Check anti-Xa level and aPTT in 6 hours and daily while on heparin - Continue to monitor H&H and platelets - f/u cardioversion plans, resumption of [place], PharmD Clinical Pharmacy Resident Pager: [phone] 10/15/2015,11:26 AM

## 2015-10-15 NOTE — ED Notes (Signed)
Repaged cards to Dr. Leonides Schanz.

## 2015-10-15 NOTE — ED Provider Notes (Signed)
By signing my name below, I, Hansel Feinstein, attest that this documentation has been prepared under the direction and in the presence of [place], DO. Electronically Signed: Hansel Feinstein, ED Scribe. 10/15/2015. 1:50 AM.   TIME SEEN: 1:41 AM   CHIEF COMPLAINT:  Chief Complaint  Patient presents with  . Atrial Fibrillation  . Chest Pain    HPI:  HPI Comments: [name] is a 54 y.o. female BIBA with h/o polysubstance abuse, cardiomyopathy, alcoholic cirrhosis, chronic systolic CHF, GERD, PAF for 3 years, HTN, CAD, HLD, type II DM, CAD, COPD who presents to the Emergency Department complaining of moderate palpitations onset 2 hours ago while at rest. Stay she did have associated chest pain, shortness of breath. She states associated left sided HA, neck pain. Pt reports current resolution of the palpitations. She notes one episode of emesis en route. Pt is on Xarelto and Plavix, bid Lasix, 37.5 bid Coreg (started on 09/07/15). No missed doses of Xarelto. Pt is not sure if she has missed any doses of Coreg. Pt states she believes she took all her medication tonight at [facility]. Pt states she is compliant with all her medications but when questioned about the Coreg she states she does not remember having this medication filled after her last hospitalization. No h/o cardiac stents, heart failure, PE. H/o DVT in the right leg. No drug or cocaine use. She notes she had 1 beer 4 weeks ago, but no other alcohol use recently. Denies any other illicit substance abuse. Cardiologist is Dr. Alvester Chou and PCP is Dr. [name]. She denies CP currently, SOB, hematochezia, melena. No recent vomiting or diarrhea.  ROS: See HPI Constitutional: no fever  Eyes: no drainage  ENT: no runny nose   Cardiovascular:   chest pain. Positive for palpitations  Resp: no SOB  GI: No hematochezia, melena. Positive for one episode of vomiting GU: no dysuria Integumentary: no rash  Allergy: no hives  Musculoskeletal: no leg swelling.  Positive for neck pain Neurological: no slurred speech. Positive for HA.  ROS otherwise negative  PAST MEDICAL HISTORY/PAST SURGICAL HISTORY:  Past Medical History  Diagnosis Date  . Alcohol abuse   . Narcotic abuse   . Cocaine abuse   . Marijuana abuse   . Tobacco abuse   . Alcoholic cirrhosis ([place])   . Cardiomyopathy ([place])   . Obesity   . Chronic systolic CHF (congestive heart failure) ([place])     a. Last echo 12/205: EF 40-45%, inferoapical/posterior HK, not technically sufficient to allow eval of LV diastolic function, normal LA in size..  . Hypothyroidism   . GERD (gastroesophageal reflux disease)   . PAF (paroxysmal atrial fibrillation) ([place])   . Critical lower limb ischemia   . Hypertension   . CAD (coronary artery disease)     a. cath 11/10/2014 small vessel CAD. Demand ischemia in the setting of rapid a-fib  . Pneumonia     END MARCH 2016  . Anxiety   . Wrist fracture     LEFT; "just did xray and cast"  . Poorly controlled type 2 diabetes mellitus ([place])   . Hyperlipemia   . Peripheral arterial disease ([name])     a. 01/2015 Angio/PTA: LSFA 100 w/ recon @ adductor canal and 1 vessel runoff via AT, RSFA 99 (atherectomy/pta) - 1 vessel runoff via diff dzs peroneal.  . Carotid artery disease ([place])     a. 01/2015 Carotid Angio: RICA 458, LICA 09X. s/p L carotid endarterectomy 02/2015.  [name]  COPD (chronic obstructive pulmonary disease) ([place])   . Arthritis     "hands, arms, shoulders, legs, back" (04/29/2015)  . Diabetic peripheral neuropathy ([name])     MEDICATIONS:  Prior to Admission medications   Medication Sig Start Date End Date Taking? Authorizing Provider  albuterol (PROVENTIL HFA;VENTOLIN HFA) 108 (90 BASE) MCG/ACT inhaler Inhale 2 puffs into the lungs every 6 (six) hours as needed for wheezing or shortness of breath. 06/30/15   Arnoldo Morale, MD  albuterol (PROVENTIL) (2.5 MG/3ML) 0.083% nebulizer solution Take 3 mLs (2.5 mg total) by nebulization every 6 (six) hours as  needed. For wheezing. 06/30/15   Arnoldo Morale, MD  atorvastatin (LIPITOR) 40 MG tablet Take 1 tablet (40 mg total) by mouth daily. 06/30/15   Arnoldo Morale, MD  beta carotene w/minerals (OCUVITE) tablet Take 1 tablet by mouth daily.    Historical Provider, MD  Blood Glucose Monitoring Suppl (ACCU-CHEK AVIVA PLUS) W/DEVICE KIT 1 Device by Does not apply route 4 (four) times daily -  before meals and at bedtime. 07/21/15   Arnoldo Morale, MD  carvedilol (COREG) 12.5 MG tablet Take 3 tablets (37.5 mg total) by mouth 2 (two) times daily with a meal. 09/07/15   Geradine Girt, DO  Cholecalciferol (VITAMIN D PO) Take 1 capsule by mouth daily.    Historical Provider, MD  clopidogrel (PLAVIX) 75 MG tablet Take 1 tablet (75 mg total) by mouth daily with breakfast. 06/30/15   Arnoldo Morale, MD  cyclobenzaprine (FLEXERIL) 10 MG tablet Take 1 tablet (10 mg total) by mouth 3 (three) times daily as needed for muscle spasms. 07/21/15   Arnoldo Morale, MD  furosemide (LASIX) 40 MG tablet Take 1 tablet (40 mg total) by mouth 2 (two) times daily. 09/07/15   Geradine Girt, DO  Gabapentin, Once-Daily, (GRALISE) 300 MG TABS Take 1 tablet by mouth 4 (four) times daily.    Historical Provider, MD  glipiZIDE (GLUCOTROL XL) 10 MG 24 hr tablet Take 2 tablets (20 mg total) by mouth daily with breakfast. 06/30/15   Arnoldo Morale, MD  glucose blood (ACCU-CHEK AVIVA PLUS) test strip Use as instructed 07/21/15   Arnoldo Morale, MD  insulin aspart (NOVOLOG) 100 UNIT/ML FlexPen Sliding scale Patient taking differently: Inject 2-8 Units into the skin 3 (three) times daily with meals. Sliding scale 06/30/15   Arnoldo Morale, MD  insulin glargine (LANTUS) 100 UNIT/ML injection Inject 0.7 mLs (70 Units total) into the skin every morning. 09/07/15   Geradine Girt, DO  Insulin Pen Needle (ULTICARE MICRO PEN NEEDLES) 32G X 4 MM MISC 1 Syringe by Does not apply route 4 (four) times daily - after meals and at bedtime. 06/30/15   Arnoldo Morale, MD  Lancets  (ACCU-CHEK SOFT TOUCH) lancets Use as instructed 07/21/15   Arnoldo Morale, MD  levothyroxine (SYNTHROID, LEVOTHROID) 50 MCG tablet Take 1 tablet (50 mcg total) by mouth daily. 06/30/15   Arnoldo Morale, MD  lisinopril (PRINIVIL,ZESTRIL) 20 MG tablet Take 1 tablet (20 mg total) by mouth daily. 06/30/15   Arnoldo Morale, MD  nitroGLYCERIN (NITROSTAT) 0.4 MG SL tablet Place 1 tablet (0.4 mg total) under the tongue every 5 (five) minutes as needed for chest pain. 01/16/15   Rogelia Mire, NP  pantoprazole (PROTONIX) 40 MG tablet Take 1 tablet (40 mg total) by mouth daily. 09/22/15 09/21/16  Arnoldo Morale, MD  rivaroxaban (XARELTO) 20 MG TABS tablet Take 1 tablet (20 mg total) by mouth daily with supper. 06/30/15   Arnoldo Morale,  MD  traMADol (ULTRAM) 50 MG tablet Take 1 tablet (50 mg total) by mouth every 12 (twelve) hours as needed for moderate pain. 09/07/15   Geradine Girt, DO    ALLERGIES:  Allergies  Allergen Reactions  . Amiodarone Nausea And Vomiting    SOCIAL HISTORY:  Social History  Substance Use Topics  . Smoking status: Current Some Day Smoker -- 0.00 packs/day for 40 years    Types: E-cigarettes    Last Attempt to Quit: 02/19/2015  . Smokeless tobacco: Never Used  . Alcohol Use: No    FAMILY HISTORY: Family History  Problem Relation Age of Onset  . Hypertension Mother   . Diabetes Mother   . Cancer Mother     breast, ovarian, colon  . Clotting disorder Mother   . Heart disease Mother   . Heart attack Mother   . Hypertension Father   . Heart disease Father   . Emphysema Sister     smoked    EXAM: BP 130/79 mmHg  Pulse 100  Temp(Src) 98.8 F (37.1 C) (Oral)  Resp 33  Ht '5\' 4"'  (1.626 m)  Wt 228 lb (103.42 kg)  BMI 39.12 kg/m2  SpO2 99%  LMP 11/27/2012 CONSTITUTIONAL: Alert and oriented and responds appropriately to questions. Well-appearing; well-nourished HEAD: Normocephalic EYES: Conjunctivae clear, PERRL ENT: normal nose; no rhinorrhea; moist mucous membranes;  pharynx without lesions noted NECK: Supple, no meningismus, no LAD  CARD: Regular rate and rhythm; S1 and S2 appreciated; no murmurs, no clicks, no rubs, no gallops RESP: Normal chest excursion without splinting or tachypnea; breath sounds clear and equal bilaterally; no wheezes, no rhonchi, no rales, no hypoxia or respiratory distress, speaking full sentences ABD/GI: Normal bowel sounds; non-distended; soft, non-tender, no rebound, no guarding, no peritoneal signs BACK:  The back appears normal and is non-tender to palpation, there is no CVA tenderness EXT: Normal ROM in all joints; non-tender to palpation; no edema; normal capillary refill; no cyanosis, no calf tenderness or swelling    SKIN: Normal color for age and race; warm NEURO: Moves all extremities equally, sensation to light touch intact diffusely, cranial nerves II through XII intact PSYCH: The patient's mood and manner are appropriate. Grooming and personal hygiene are appropriate.  MEDICAL DECISION MAKING: Patient here with chest pain, atrial fibrillation with RVR. Heart rate initially in the 160s. She received adenosine without any relief with EMS and then 10 mg of Cardizem prior to arrival. By the time I have walked into the room patient has spontaneously converted into a normal sinus rhythm. Her symptoms have also resolved. We'll obtain labs, chest x-ray. We'll give IV fluids. Patient is not sure if she has had her Coreg tonight. Will give dose of 25 mg.  ED PROGRESS: Patient's first troponin is negative. Labs otherwise unremarkable. TSH normal. Chest x-ray clear. We'll repeat second troponin continue to monitor patient. UDS is negative.   6:00 AM  Patient's second troponin is positive at 1.83. Does appear that she has had a cardiac catheterization one year ago that showed nonobstructive coronary artery disease. This may be secondary to demand ischemia but given her risk factors for ACS and complaints of chest pain earlier, will  discuss with cardiology for admission. We'll give aspirin. She is still chest pain-free.   7:30 AM  D/w Dr. Meda Coffee with cardiology who will see pt in ED for admission.    EKG Interpretation  Date/Time:  Thursday October 15 2015 00:56:04 EST Ventricular Rate:  162 PR Interval:  QRS Duration: 90 QT Interval:  267 QTC Calculation: 438 R Axis:   22 Text Interpretation:  Atrial fibrillation Repolarization abnormality, prob rate related Confirmed by WARD,  DO, KRISTEN [phone]) on 10/15/2015 1:45:21 AM          EKG Interpretation  Date/Time:  Thursday October 15 2015 02:09:53 EST Ventricular Rate:  99 PR Interval:  137 QRS Duration: 92 QT Interval:  375 QTC Calculation: 481 R Axis:   16 Text Interpretation:  Sinus rhythm Nonspecific repol abnormality, diffuse leads No longer in atrial fibrillation Confirmed by WARD,  DO, KRISTEN ([id number]) on 10/15/2015 2:44:23 AM        CRITICAL CARE Performed by: Nyra Jabs   Total critical care time: 45 minutes - a fib RVR, NSTEMI  Critical care time was exclusive of separately billable procedures and treating other patients.  Critical care was necessary to treat or prevent imminent or life-threatening deterioration.  Critical care was time spent personally by me on the following activities: development of treatment plan with patient and/or surrogate as well as nursing, discussions with consultants, evaluation of patient's response to treatment, examination of patient, obtaining history from patient or surrogate, ordering and performing treatments and interventions, ordering and review of laboratory studies, ordering and review of radiographic studies, pulse oximetry and re-evaluation of patient's condition.     I personally performed the services described in this documentation, which was scribed in my presence. The recorded information has been reviewed and is accurate.   [place], DO 10/15/15 [phone]

## 2015-10-15 NOTE — ED Notes (Signed)
Paged cardiology x 2.

## 2015-10-15 NOTE — ED Notes (Signed)
Per EMS pt started having chest pain with radiation to left neck and arm; Pt c/o n/v; HR was 180-220 on site; pt received 6 mg Adenosine  With 10 mg Cardizem; Pt at 3/10 chest pain on arrival; Pt denise SOB;

## 2015-10-15 NOTE — ED Notes (Signed)
Heart healthy diet ordered. 

## 2015-10-15 NOTE — ED Notes (Signed)
Cardiology at bedside.

## 2015-10-15 NOTE — Progress Notes (Signed)
Pt troponin went from 1.89 to 2.28. MD notified. Will continue to monitor.

## 2015-10-15 NOTE — H&P (Signed)
Patient ID: [name] MRN: [id number], DOB/AGE: [birth year]   Admit date: 10/15/2015   Primary Physician: Lynne Logan, MD Primary Cardiologist: Dr Gwenlyn Found, Dr Johnsie Cancel  Pt. Profile:  A-fib with RVR, troponin elevation  Problem List  Past Medical History  Diagnosis Date  . Alcohol abuse   . Narcotic abuse   . Cocaine abuse   . Marijuana abuse   . Tobacco abuse   . Alcoholic cirrhosis ([place])   . Cardiomyopathy ([place])   . Obesity   . Chronic systolic CHF (congestive heart failure) ([place])     a. Last echo 12/205: EF 40-45%, inferoapical/posterior HK, not technically sufficient to allow eval of LV diastolic function, normal LA in size..  . Hypothyroidism   . GERD (gastroesophageal reflux disease)   . PAF (paroxysmal atrial fibrillation) ([place])   . Critical lower limb ischemia   . Hypertension   . CAD (coronary artery disease)     a. cath 11/10/2014 small vessel CAD. Demand ischemia in the setting of rapid a-fib  . Pneumonia     END MARCH 2016  . Anxiety   . Wrist fracture     LEFT; "just did xray and cast"  . Poorly controlled type 2 diabetes mellitus ([place])   . Hyperlipemia   . Peripheral arterial disease ([place])     a. 01/2015 Angio/PTA: LSFA 100 w/ recon @ adductor canal and 1 vessel runoff via AT, RSFA 99 (atherectomy/pta) - 1 vessel runoff via diff dzs peroneal.  . Carotid artery disease ([place])     a. 01/2015 Carotid Angio: RICA 174, LICA 08X. s/p L carotid endarterectomy 02/2015.  [name] COPD (chronic obstructive pulmonary disease) ([place])   . Arthritis     "hands, arms, shoulders, legs, back" (04/29/2015)  . Diabetic peripheral neuropathy [facility])     Past Surgical History  Procedure Laterality Date  . Cardioversion  ~ 02/2013    "twice"   . Lower extremity angiogram N/A 09/10/2013    Procedure: LOWER EXTREMITY ANGIOGRAM;  Surgeon: [name], MD;  Location: [facility] CATH LAB;  Service: Cardiovascular;  Laterality: N/A;  . Left heart catheterization with coronary angiogram N/A  10/31/2014    Procedure: LEFT HEART CATHETERIZATION WITH CORONARY ANGIOGRAM;  Surgeon: Burnell Blanks, MD;  Location: [facility] CATH LAB;  Service: Cardiovascular;  Laterality: N/A;  . Peripheral athrectomy Right 01/15/2015    SFA/notes 01/15/2015  . Balloon angioplasty, artery Right 01/15/2015    SFA/notes 01/15/2015  . Cardiac catheterization    . Lower extremity angiogram N/A 01/15/2015    Procedure: LOWER EXTREMITY ANGIOGRAM;  Surgeon: [name], MD;  Location: [facility] CATH LAB;  Service: Cardiovascular;  Laterality: N/A;  . Carotid angiogram N/A 01/15/2015    Procedure: CAROTID ANGIOGRAM;  Surgeon: [name], MD;  Location: [facility] CATH LAB;  Service: Cardiovascular;  Laterality: N/A;  . Endarterectomy Left 02/19/2015    Procedure: LEFT CAROTID ENDARTERECTOMY ;  Surgeon: Serafina Mitchell, MD;  Location: [facility] OR;  Service: Vascular;  Laterality: Left;  . Dilation and curettage of uterus  1988     Allergies  Allergies  Allergen Reactions  . Amiodarone Nausea And Vomiting    HPI  [name] is a 54 year old obese female with h/o PAD, heavy smoker, cocaine abuse, alcoholic cirrhosis , with CMP, cardiomyopathy paroxysmal atrial fibrillation, DM, HTN, DVT in right LE. She's never had a heart attack or stroke. She had paroxysmal atrial fibrillation in the past and has undergone cardioversion in [place]. The lower  extremity arterial Doppler studies performed in our office suggested critical limb ischemia with an occluded SFA and popliteal arteries bilaterally with tibial vessel disease as well. She complains of lifestyle limiting claudication.  She underwent cardiac catheterization on 10/31/14 revealing an ejection fraction of 30-35% with disease that was ultimately treated medically. Her carotid Doppler showed high-grade bilateral ICA disease and lower extremity Dopplers revealed restenosis within the right SFA with a ABI of 0.34. She underwent left carotid endarterectomy by Dr. Trula Slade on 02/19/15  which was uncomplicated. Since I saw her 6 months ago she's been remaining clinically stable. She denies chest pain or shortness of breath. She continues to have claudication.Lower extremity Doppler studies performed 01/23/15 revealed ABIs in the 0.6 range bilaterally with a patent right SFA.  She was recently admitted to [facility] because of atrial fibrillation with rapid ventricular response. Today she presented to the ED with palpitations that started 2 hours ago while at rest. She did have associated chest pain, shortness of breath. She states associated left sided HA, neck pain. Pt reports current resolution of the palpitations. She notes one episode of emesis en route. Pt is on Xarelto and Plavix, bid Lasix, 37.5 bid Coreg (started on 09/07/15). No missed doses of Xarelto. Pt is not sure if she has missed any doses of Coreg. Pt states she believes she took all her medication tonight at [facility]. Pt states she is compliant with all her medications but when questioned about the Coreg she states she does not remember having this medication filled after her last hospitalization. No h/o cardiac stents. She is currently chest pain free, denies SOB, hematochezia, melena. No recent vomiting or diarrhea.  She states that she never picked up carvedilol after she was discharge for a similar episode in October 2016, denies chest pain, complains of leg pain.  Home Medications  Prior to Admission medications   Medication Sig Start Date End Date Taking? Authorizing Provider  acetaminophen-codeine (TYLENOL #3) 300-30 MG tablet Take 1 tablet by mouth every 4 (four) hours as needed for moderate pain.   Yes Historical Provider, MD  albuterol (PROVENTIL HFA;VENTOLIN HFA) 108 (90 BASE) MCG/ACT inhaler Inhale 2 puffs into the lungs every 6 (six) hours as needed for wheezing or shortness of breath. 06/30/15  Yes Arnoldo Morale, MD  atorvastatin (LIPITOR) 40 MG tablet Take 1 tablet (40 mg total) by mouth daily. 06/30/15   Yes Arnoldo Morale, MD  carvedilol (COREG) 12.5 MG tablet Take 3 tablets (37.5 mg total) by mouth 2 (two) times daily with a meal. 09/07/15  Yes Geradine Girt, DO  clopidogrel (PLAVIX) 75 MG tablet Take 1 tablet (75 mg total) by mouth daily with breakfast. 06/30/15  Yes Arnoldo Morale, MD  furosemide (LASIX) 40 MG tablet Take 1 tablet (40 mg total) by mouth 2 (two) times daily. 09/07/15  Yes Geradine Girt, DO  Gabapentin, Once-Daily, (GRALISE) 300 MG TABS Take 1 tablet by mouth 4 (four) times daily.   Yes Historical Provider, MD  glipiZIDE (GLUCOTROL XL) 10 MG 24 hr tablet Take 2 tablets (20 mg total) by mouth daily with breakfast. 06/30/15  Yes Arnoldo Morale, MD  insulin glargine (LANTUS) 100 UNIT/ML injection Inject 0.7 mLs (70 Units total) into the skin every morning. 09/07/15  Yes Geradine Girt, DO  levothyroxine (SYNTHROID, LEVOTHROID) 50 MCG tablet Take 1 tablet (50 mcg total) by mouth daily. 06/30/15  Yes Arnoldo Morale, MD  lisinopril (PRINIVIL,ZESTRIL) 20 MG tablet Take 1 tablet (20 mg total) by  mouth daily. 06/30/15  Yes Arnoldo Morale, MD  metFORMIN (GLUCOPHAGE) 500 MG tablet Take 500 mg by mouth 2 (two) times daily with a meal.   Yes Historical Provider, MD  metoCLOPramide (REGLAN) 5 MG tablet Take 5 mg by mouth every 6 (six) hours as needed for nausea.   Yes Historical Provider, MD  nitroGLYCERIN (NITROSTAT) 0.4 MG SL tablet Place 1 tablet (0.4 mg total) under the tongue every 5 (five) minutes as needed for chest pain. 01/16/15  Yes Rogelia Mire, NP  pantoprazole (PROTONIX) 40 MG tablet Take 1 tablet (40 mg total) by mouth daily. 09/22/15 09/21/16 Yes Arnoldo Morale, MD  promethazine (PHENERGAN) 12.5 MG tablet Take 12.5 mg by mouth every 6 (six) hours as needed for nausea or vomiting.   Yes Historical Provider, MD  rivaroxaban (XARELTO) 20 MG TABS tablet Take 1 tablet (20 mg total) by mouth daily with supper. 06/30/15  Yes Arnoldo Morale, MD  albuterol (PROVENTIL) (2.5 MG/3ML) 0.083% nebulizer  solution Take 3 mLs (2.5 mg total) by nebulization every 6 (six) hours as needed. For wheezing. Patient not taking: Reported on 10/15/2015 06/30/15   Arnoldo Morale, MD  Blood Glucose Monitoring Suppl (ACCU-CHEK AVIVA PLUS) W/DEVICE KIT 1 Device by Does not apply route 4 (four) times daily -  before meals and at bedtime. 07/21/15   Arnoldo Morale, MD  cyclobenzaprine (FLEXERIL) 10 MG tablet Take 1 tablet (10 mg total) by mouth 3 (three) times daily as needed for muscle spasms. Patient not taking: Reported on 10/15/2015 07/21/15   Arnoldo Morale, MD  glucose blood (ACCU-CHEK AVIVA PLUS) test strip Use as instructed 07/21/15   Arnoldo Morale, MD  insulin aspart (NOVOLOG) 100 UNIT/ML FlexPen Sliding scale Patient not taking: Reported on 10/15/2015 06/30/15   Arnoldo Morale, MD  Insulin Pen Needle (ULTICARE MICRO PEN NEEDLES) 32G X 4 MM MISC 1 Syringe by Does not apply route 4 (four) times daily - after meals and at bedtime. 06/30/15   Arnoldo Morale, MD  Lancets (ACCU-CHEK SOFT TOUCH) lancets Use as instructed 07/21/15   Arnoldo Morale, MD  traMADol (ULTRAM) 50 MG tablet Take 1 tablet (50 mg total) by mouth every 12 (twelve) hours as needed for moderate pain. Patient not taking: Reported on 10/15/2015 09/07/15   Geradine Girt, DO    Family History  Family History  Problem Relation Age of Onset  . Hypertension Mother   . Diabetes Mother   . Cancer Mother     breast, ovarian, colon  . Clotting disorder Mother   . Heart disease Mother   . Heart attack Mother   . Hypertension Father   . Heart disease Father   . Emphysema Sister     smoked    Social History  Social History   Social History  . Marital Status: Divorced    Spouse Name: N/A  . Number of Children: N/A  . Years of Education: N/A   Occupational History  . disabled    Social History Main Topics  . Smoking status: Current Some Day Smoker -- 0.00 packs/day for 40 years    Types: E-cigarettes    Last Attempt to Quit: 02/19/2015  . Smokeless  tobacco: Never Used  . Alcohol Use: No  . Drug Use: No     Comment: 04/29/2015 "last drug use was ~ 09/08/2013"  . Sexual Activity: No   Other Topics Concern  . Not on file   Social History Narrative     Review of Systems General:  No  chills, fever, night sweats or weight changes.  Cardiovascular:  No chest pain, dyspnea on exertion, edema, orthopnea, palpitations, paroxysmal nocturnal dyspnea. Dermatological: No rash, lesions/masses Respiratory: No cough, dyspnea Urologic: No hematuria, dysuria Abdominal:   No nausea, vomiting, diarrhea, bright red blood per rectum, melena, or hematemesis Neurologic:  No visual changes, wkns, changes in mental status. All other systems reviewed and are otherwise negative except as noted above.  Physical Exam  Blood pressure 125/75, pulse 72, temperature 98.8 F (37.1 C), temperature source Oral, resp. rate 17, height _0  (1.626 m), weight 228 lb (103.42 kg), last menstrual period 11/27/2012, SpO2 98 %.  Psych: Normal affect. Neuro: Alert and oriented X 3. Moves all extremities spontaneously. HEENT: Normal  Neck: Supple without bruits or JVD. Lungs:  Resp regular and unlabored, CTA. Heart: RRR no s3, s4, 2/6 syst murmurs. Abdomen: Soft, non-tender, non-distended, BS + x 4.  Extremities: No clubbing, cyanosis or edema. DP/PT weak billaterally.  Labs  No results for input(s): CKTOTAL, CKMB, TROPONINI in the last 72 hours. Lab Results  Component Value Date   WBC 12.8* 10/15/2015   HGB 11.0* 10/15/2015   HCT 35.3* 10/15/2015   MCV 89.4 10/15/2015   PLT 346 10/15/2015    Recent Labs Lab 10/15/15 0115  NA 136  K 3.8  CL 94*  CO2 31  BUN 25*  CREATININE 1.12*  CALCIUM 9.2  PROT 7.1  BILITOT 0.4  ALKPHOS 277*  ALT 99*  AST 63*  GLUCOSE 355*   Lab Results  Component Value Date   CHOL 208* 03/31/2015   HDL 43* 03/31/2015   LDLCALC 96 03/31/2015   TRIG 347* 03/31/2015   Lab Results  Component Value Date   DDIMER 0.48  10/29/2014   Invalid input(s): POCBNP   Radiology/Studies  Dg Chest Portable 1 View  10/15/2015  CLINICAL DATA:  Chest pain and dyspnea, onset tonight. EXAM: PORTABLE CHEST 1 VIEW COMPARISON:  09/04/2015 FINDINGS: A single AP portable view of the chest demonstrates no focal airspace consolidation or alveolar edema. The lungs are grossly clear. There is no large effusion or pneumothorax. Cardiac and mediastinal contours appear unremarkable. IMPRESSION: No active disease. Electronically Signed   By: Andreas Newport M.D.   On: 10/15/2015 01:55   Echocardiogram - 10/2014 - Procedure narrative: Transthoracic echocardiography. Technically difficult study with reduced echocardiographic windows. - Left ventricle: The cavity size was normal. Wall thickness was normal. Systolic function was mildly to moderately reduced. The estimated ejection fraction was in the range of 40% to 45%. There appears to be inferoapical and posterior hypokinesis. The study is not technically sufficient to allow evaluation of LV diastolic function. - Left atrium: The atrium was normal in size.  Impressions:  - Technically difficult study. Compared to the prior echo in 2014, the EF is reduced to around 45% with what appears to be inferoapical and posterior hypokinesis.  ECG:   Left cardiac cath: 10/31/2014  Angiographic Findings:  Left main: No obstructive disease.   Left Anterior Descending Artery: Large caliber vessel that courses to the apex. The mid vessel has mild diffuse plaque. The first diagonal branch is a small caliber vessel with ostial 30% stenosis. The second diagonal branch is a small to moderate caliber vessel with distal 50% stenosis.   Circumflex Artery: Large caliber vessel with 30% mid stenosis. The first obtuse marginal branch is a small caliber vessel (1.5 mm) with diffuse 80% stenosis in the proximal and mid segment. The second obtuse marginal branch is small  in caliber  with 40% mid stenosis. The left posterolateral branch has mild diffuse plaque.   Right Coronary Artery: Small caliber non-dominant vessel with mid 90% stenosis.   Left Ventricular Angiogram: LVEF=30-35% with severe hypokinesis fo the anterior wall and apex, inferoapex.   Impression: 1. Rapid atrial fibrillation during case 2. Moderately reduced LV systolic function 3. Diffuse small vessel disease including the small non-dominant RCA and small caliber first obtuse marginal branch 4. Elevated troponin likely due to demand ischemia with underlying small vessel CAD and rapid atrial fib  Recommendations: Medical management of CAD. As above, her elevated troponin is likely related to demand ischemia with small vessel CAD and rapid atrial fib. Rate is still poorly controlled on IV amiodarone and IV diltiazem. May need TEE guided cardioversion.      ASSESSMENT AND PLAN  1. Elevated troponin - uptrending, in the settings of a-fib with RVR with ventricular rate up to 160 BPM, troponin 0.04 --> 1.83, this is most probably demand ischemia, she is chest pain free. Hold Xarelto, start iv Heparin drip.  Restart carvedilol, the patient was most probably not taking it. The last cath in 10/2014 showed diffuse small vessel disease, medical management was recommended, she continues to smoke heavily.  Continue ASA, Plavix, lisinoprol, carvedilol, atorvastatin.  2. Paroxysmal a-fib with RVR, cardioverted spontaneously to SR, restart carvedilol  3. Chronic systolic CHF - LVEF 49-44%, she is euvolemic  4. HTN - controlled  5. PAD - followed by Dr Gwenlyn Found.   We will admit.    Signed, Dorothy Spark, MD, [facility] 10/15/2015, 10:34 AM

## 2015-10-15 NOTE — ED Notes (Signed)
MD at bedside. 

## 2015-10-15 NOTE — Hospital Discharge Follow-Up (Signed)
Transitional Care Clinic:  This Case Manager met with patient at bedside to determine her plan for medical follow-up after discharge as patient has been followed by the Transitional Care Clinic in the past.  Patient indicated her PCP is Dr. Sun, and she indicated she plans to follow-up with him after discharge.  She indicated she already has an appointment scheduled with him on 10/21/15. Kristi Webster, RN CM updated. 

## 2015-10-16 ENCOUNTER — Encounter (HOSPITAL_COMMUNITY): Admission: RE | Admit: 2015-10-16 | Payer: Medicare Other | Source: Ambulatory Visit

## 2015-10-16 ENCOUNTER — Telehealth: Payer: Self-pay | Admitting: Cardiovascular Disease

## 2015-10-16 ENCOUNTER — Ambulatory Visit (HOSPITAL_COMMUNITY): Payer: Medicaid Other

## 2015-10-16 DIAGNOSIS — I4891 Unspecified atrial fibrillation: Secondary | ICD-10-CM | POA: Diagnosis not present

## 2015-10-16 DIAGNOSIS — I10 Essential (primary) hypertension: Secondary | ICD-10-CM | POA: Diagnosis not present

## 2015-10-16 DIAGNOSIS — I5042 Chronic combined systolic (congestive) and diastolic (congestive) heart failure: Secondary | ICD-10-CM

## 2015-10-16 DIAGNOSIS — I480 Paroxysmal atrial fibrillation: Secondary | ICD-10-CM | POA: Diagnosis not present

## 2015-10-16 DIAGNOSIS — I214 Non-ST elevation (NSTEMI) myocardial infarction: Secondary | ICD-10-CM

## 2015-10-16 LAB — APTT
aPTT: 46 seconds — ABNORMAL HIGH (ref 24–37)
aPTT: 48 seconds — ABNORMAL HIGH (ref 24–37)

## 2015-10-16 LAB — CBC
HCT: 30.1 % — ABNORMAL LOW (ref 36.0–46.0)
Hemoglobin: 9.5 g/dL — ABNORMAL LOW (ref 12.0–15.0)
MCH: 28.1 pg (ref 26.0–34.0)
MCHC: 31.6 g/dL (ref 30.0–36.0)
MCV: 89.1 fL (ref 78.0–100.0)
Platelets: 277 10*3/uL (ref 150–400)
RBC: 3.38 MIL/uL — ABNORMAL LOW (ref 3.87–5.11)
RDW: 13.7 % (ref 11.5–15.5)
WBC: 11.9 10*3/uL — ABNORMAL HIGH (ref 4.0–10.5)

## 2015-10-16 LAB — GLUCOSE, CAPILLARY
Glucose-Capillary: 188 mg/dL — ABNORMAL HIGH (ref 65–99)
Glucose-Capillary: 319 mg/dL — ABNORMAL HIGH (ref 65–99)
Glucose-Capillary: 186 mg/dL — ABNORMAL HIGH (ref 65–99)
Glucose-Capillary: 201 mg/dL — ABNORMAL HIGH (ref 65–99)

## 2015-10-16 LAB — COMPREHENSIVE METABOLIC PANEL
ALT: 81 U/L — AB (ref 14–54)
AST: 54 U/L — ABNORMAL HIGH (ref 15–41)
Albumin: 2.8 g/dL — ABNORMAL LOW (ref 3.5–5.0)
Alkaline Phosphatase: 204 U/L — ABNORMAL HIGH (ref 38–126)
Anion gap: 12 (ref 5–15)
BILIRUBIN TOTAL: 0.4 mg/dL (ref 0.3–1.2)
BUN: 31 mg/dL — ABNORMAL HIGH (ref 6–20)
CHLORIDE: 94 mmol/L — AB (ref 101–111)
CO2: 30 mmol/L (ref 22–32)
CREATININE: 1.23 mg/dL — AB (ref 0.44–1.00)
Calcium: 8.9 mg/dL (ref 8.9–10.3)
GFR, EST AFRICAN AMERICAN: 57 mL/min — AB (ref >60)
GFR, EST NON AFRICAN AMERICAN: 50 mL/min — AB (ref >60)
Glucose, Bld: 184 mg/dL — ABNORMAL HIGH (ref 65–99)
Potassium: 4.1 mmol/L (ref 3.5–5.1)
Sodium: 136 mmol/L (ref 135–145)
TOTAL PROTEIN: 6.4 g/dL — AB (ref 6.5–8.1)

## 2015-10-16 LAB — HEPARIN LEVEL (UNFRACTIONATED): Heparin Unfractionated: 0.68 IU/mL (ref 0.30–0.70)

## 2015-10-16 LAB — HEMOGLOBIN A1C
HEMOGLOBIN A1C: 11.1 % — AB (ref 4.8–5.6)
MEAN PLASMA GLUCOSE: 272 mg/dL

## 2015-10-16 LAB — PROTIME-INR
INR: 1.14 (ref 0.00–1.49)
PROTHROMBIN TIME: 14.8 s (ref 11.6–15.2)

## 2015-10-16 LAB — LIPID PANEL
CHOL/HDL RATIO: 4.6 ratio
CHOLESTEROL: 160 mg/dL (ref 0–200)
HDL: 35 mg/dL — ABNORMAL LOW (ref >40)
LDL Cholesterol: 76 mg/dL (ref 0–99)
Triglycerides: 246 mg/dL — ABNORMAL HIGH (ref <150)
VLDL: 49 mg/dL — ABNORMAL HIGH (ref 0–40)

## 2015-10-16 LAB — TROPONIN I: TROPONIN I: 1.43 ng/mL — AB (ref <0.031)

## 2015-10-16 MED ORDER — RIVAROXABAN 20 MG PO TABS
20.0000 mg | ORAL_TABLET | ORAL | Status: AC
  Administered 2015-10-16: 20 mg via ORAL
  Filled 2015-10-16: qty 1

## 2015-10-16 MED ORDER — RIVAROXABAN 20 MG PO TABS: 20.0000 mg | ORAL_TABLET | Freq: Every day | ORAL | Status: DC

## 2015-10-16 NOTE — Consult Note (Signed)
PHARMACY NOTE   Pharmacy Consult for :  Xarelto Indication:  History of atrial fibrillation   Dosing Weight: 104.9 kg  Labs:  Recent Labs  10/15/15 0115 10/15/15 1355 10/16/15 0210 10/16/15 0514 10/16/15 0850  HGB 11.0*  --   --  9.5*  --   HCT 35.3*  --   --  30.1*  --   PLT 346  --   --  277  --   APTT  --  33 46*  --  48*  LABPROT 20.0*  --   --  14.8  --   INR 1.71*  --   --  1.14  --   HEPARINUNFRC  --  2.20* 0.68  --   --   CREATININE 1.12*  --   --  1.23*  --    Estimated Creatinine Clearance: 63.2 mL/min (by C-G formula based on Cr of 1.23).  Current Medication[s] Include: Medication PTA: Prescriptions prior to admission  Medication Sig Dispense Refill Last Dose  . acetaminophen-codeine (TYLENOL #3) 300-30 MG tablet Take 1 tablet by mouth every 4 (four) hours as needed for moderate pain.   10/14/2015 at Unknown time  . albuterol (PROVENTIL HFA;VENTOLIN HFA) 108 (90 BASE) MCG/ACT inhaler Inhale 2 puffs into the lungs every 6 (six) hours as needed for wheezing or shortness of breath. 1 each 3 10/14/2015 at Unknown time  . atorvastatin (LIPITOR) 40 MG tablet Take 1 tablet (40 mg total) by mouth daily. 30 tablet 2 10/14/2015 at Unknown time  . carvedilol (COREG) 12.5 MG tablet Take 3 tablets (37.5 mg total) by mouth 2 (two) times daily with a meal. 60 tablet 0 10/14/2015 at 1800  . clopidogrel (PLAVIX) 75 MG tablet Take 1 tablet (75 mg total) by mouth daily with breakfast. 30 tablet 6 10/14/2015 at Unknown time  . furosemide (LASIX) 40 MG tablet Take 1 tablet (40 mg total) by mouth 2 (two) times daily. 90 tablet 2 10/14/2015 at Unknown time  . Gabapentin, Once-Daily, (GRALISE) 300 MG TABS Take 1 tablet by mouth 4 (four) times daily.   10/14/2015 at Unknown time  . glipiZIDE (GLUCOTROL XL) 10 MG 24 hr tablet Take 2 tablets (20 mg total) by mouth daily with breakfast. 60 tablet 3 10/14/2015 at Unknown time  . insulin glargine (LANTUS) 100 UNIT/ML injection Inject 0.7 mLs  (70 Units total) into the skin every morning. 10 mL 11 10/14/2015 at Unknown time  . levothyroxine (SYNTHROID, LEVOTHROID) 50 MCG tablet Take 1 tablet (50 mcg total) by mouth daily. 30 tablet 2 10/14/2015 at Unknown time  . lisinopril (PRINIVIL,ZESTRIL) 20 MG tablet Take 1 tablet (20 mg total) by mouth daily. 30 tablet 2 10/14/2015 at Unknown time  . metFORMIN (GLUCOPHAGE) 500 MG tablet Take 500 mg by mouth 2 (two) times daily with a meal.   10/14/2015 at Unknown time  . metoCLOPramide (REGLAN) 5 MG tablet Take 5 mg by mouth every 6 (six) hours as needed for nausea.   Past Week at Unknown time  . nitroGLYCERIN (NITROSTAT) 0.4 MG SL tablet Place 1 tablet (0.4 mg total) under the tongue every 5 (five) minutes as needed for chest pain. 25 tablet 3 unknown  . pantoprazole (PROTONIX) 40 MG tablet Take 1 tablet (40 mg total) by mouth daily. 30 tablet 2 10/14/2015 at Unknown time  . promethazine (PHENERGAN) 12.5 MG tablet Take 12.5 mg by mouth every 6 (six) hours as needed for nausea or vomiting.   Past Week at Unknown time  . rivaroxaban (  XARELTO) 20 MG TABS tablet Take 1 tablet (20 mg total) by mouth daily with supper. 30 tablet 2 10/14/2015 at Unknown time  . albuterol (PROVENTIL) (2.5 MG/3ML) 0.083% nebulizer solution Take 3 mLs (2.5 mg total) by nebulization every 6 (six) hours as needed. For wheezing. (Patient not taking: Reported on 10/15/2015) 75 mL 2 Not Taking at Unknown time  . Blood Glucose Monitoring Suppl (ACCU-CHEK AVIVA PLUS) W/DEVICE KIT 1 Device by Does not apply route 4 (four) times daily -  before meals and at bedtime. 1 kit 0 Taking  . cyclobenzaprine (FLEXERIL) 10 MG tablet Take 1 tablet (10 mg total) by mouth 3 (three) times daily as needed for muscle spasms. (Patient not taking: Reported on 10/15/2015) 30 tablet 0 Not Taking at Unknown time  . glucose blood (ACCU-CHEK AVIVA PLUS) test strip Use as instructed 100 each 12 Taking  . insulin aspart (NOVOLOG) 100 UNIT/ML FlexPen Sliding scale  (Patient not taking: Reported on 10/15/2015) 15 mL 11 Not Taking at Unknown time  . Insulin Pen Needle (ULTICARE MICRO PEN NEEDLES) 32G X 4 MM MISC 1 Syringe by Does not apply route 4 (four) times daily - after meals and at bedtime. 1 each 11 Taking  . Lancets (ACCU-CHEK SOFT TOUCH) lancets Use as instructed 100 each 12 Taking  . traMADol (ULTRAM) 50 MG tablet Take 1 tablet (50 mg total) by mouth every 12 (twelve) hours as needed for moderate pain. (Patient not taking: Reported on 10/15/2015) 20 tablet 0 Not Taking at Unknown time    Scheduled:  Scheduled:  . aspirin EC  81 mg Oral Daily  . atorvastatin  40 mg Oral Daily  . carvedilol  37.5 mg Oral BID WC  . clopidogrel  75 mg Oral Q breakfast  . furosemide  40 mg Oral BID  . gabapentin  300 mg Oral QID  . glipiZIDE  20 mg Oral Q breakfast  . insulin aspart  0-15 Units Subcutaneous TID WC  . insulin glargine  70 Units Subcutaneous q morning - 10a  . levothyroxine  50 mcg Oral Daily  . lisinopril  20 mg Oral Daily  . pantoprazole  40 mg Oral Daily  . rivaroxaban  20 mg Oral NOW  . sodium chloride  3 mL Intravenous Q12H   Infusion[s]: Infusions:  . sodium chloride 125 mL/hr at 10/15/15 2376   Assessment:  54 y/o female admitted with chest pain and history of atrial fibrillation who has been on Xarelto Prior to Admission.  Heparin was started overnight.  Patient converted spontaneously to SR.  Heparin Discontinued and Xarelto to be restarted.  Goal:  Xarelto dosed Non-valvular atrial fibrillation.   Plan: 1. DC Heparin now. 2. Xarelto 20 mg po now. 3. Then Xarelto 20 mg q day with supper.   Marthenia Rolling,  Pharm.D   10/16/2015,  11:20 AM

## 2015-10-16 NOTE — Progress Notes (Signed)
Patient Name: [name] Date of Encounter: 10/16/2015     Active Problems:   Atrial fibrillation with RVR ([place])    SUBJECTIVE  No CP or SOB, just fatigued.   CURRENT MEDS . aspirin EC  81 mg Oral Daily  . atorvastatin  40 mg Oral Daily  . carvedilol  37.5 mg Oral BID WC  . clopidogrel  75 mg Oral Q breakfast  . furosemide  40 mg Oral BID  . gabapentin  300 mg Oral QID  . glipiZIDE  20 mg Oral Q breakfast  . insulin aspart  0-15 Units Subcutaneous TID WC  . insulin glargine  70 Units Subcutaneous q morning - 10a  . levothyroxine  50 mcg Oral Daily  . lisinopril  20 mg Oral Daily  . pantoprazole  40 mg Oral Daily  . sodium chloride  3 mL Intravenous Q12H    OBJECTIVE  Filed Vitals:   10/15/15 1140 10/15/15 1257 10/15/15 2101 10/16/15 0426  BP: 130/114 126/74 115/65 112/71  Pulse: 89 74 83 72  Temp:  97.9 F (36.6 C) 97.9 F (36.6 C) 98.5 F (36.9 C)  TempSrc:  Oral Oral Oral  Resp: 21 18 18 18   Height:  5\' 4"  (1.626 m)    Weight:  229 lb 4.5 oz (104 kg)  231 lb 4.2 oz (104.9 kg)  SpO2: 100% 100% 95% 99%    Intake/Output Summary (Last 24 hours) at 10/16/15 1046 Last data filed at 10/16/15 0900  Gross per 24 hour  Intake   1320 ml  Output   1750 ml  Net   -430 ml   Filed Weights   10/15/15 0057 10/15/15 1257 10/16/15 0426  Weight: 228 lb (103.42 kg) 229 lb 4.5 oz (104 kg) 231 lb 4.2 oz (104.9 kg)    PHYSICAL EXAM  General: Pleasant, NAD. Neuro: Alert and oriented X 3. Moves all extremities spontaneously. Psych: Normal affect. HEENT:  Normal  Neck: Supple without bruits or JVD. Lungs:  Resp regular and unlabored, CTA. Heart: RRR no s3, s4, or 2//6 syst murmur.  Abdomen: Soft, non-tender, non-distended, BS + x 4.  Extremities: No clubbing, cyanosis or edema. DP/PT/Radials 2+ and equal bilaterally. Cool extremities bilaterally  Accessory Clinical Findings  CBC  Recent Labs  10/15/15 0115 10/16/15 0514  WBC 12.8* 11.9*  HGB 11.0* 9.5*    HCT 35.3* 30.1*  MCV 89.4 89.1  PLT 346 99991111   Basic Metabolic Panel  Recent Labs  10/15/15 0115 10/15/15 1355 10/16/15 0514  NA 136  --  136  K 3.8  --  4.1  CL 94*  --  94*  CO2 31  --  30  GLUCOSE 355*  --  184*  BUN 25*  --  31*  CREATININE 1.12*  --  1.23*  CALCIUM 9.2  --  8.9  MG  --  1.4*  --    Liver Function Tests  Recent Labs  10/15/15 0115 10/16/15 0514  AST 63* 54*  ALT 99* 81*  ALKPHOS 277* 204*  BILITOT 0.4 0.4  PROT 7.1 6.4*  ALBUMIN 3.3* 2.8*   No results for input(s): LIPASE, AMYLASE in the last 72 hours. Cardiac Enzymes  Recent Labs  10/15/15 1355 10/15/15 1655 10/15/15 2340  TROPONINI 1.89* 2.28* 1.43*   BNP Invalid input(s): POCBNP D-Dimer No results for input(s): DDIMER in the last 72 hours. Hemoglobin A1C  Recent Labs  10/15/15 1355  HGBA1C 11.1*   Fasting Lipid Panel  Recent Labs  10/16/15 0514  CHOL 160  HDL 35*  LDLCALC 76  TRIG 246*  CHOLHDL 4.6   Thyroid Function Tests  Recent Labs  10/15/15 0213  TSH 2.196    TELE NSR with PVCs and one run NSVT  Radiology/Studies  Dg Chest Portable 1 View  10/15/2015  CLINICAL DATA:  Chest pain and dyspnea, onset tonight. EXAM: PORTABLE CHEST 1 VIEW COMPARISON:  09/04/2015 FINDINGS: A single AP portable view of the chest demonstrates no focal airspace consolidation or alveolar edema. The lungs are grossly clear. There is no large effusion or pneumothorax. Cardiac and mediastinal contours appear unremarkable. IMPRESSION: No active disease. Electronically Signed   By: Andreas Newport M.D.   On: 10/15/2015 01:55    ASSESSMENT AND PLAN [name] is a 54 year old obese female with h/o PAD, heavy smoker, cocaine abuse, alcoholic cirrhosis, with CMP, cardiomyopathy paroxysmal atrial fibrillation, DM, HTN, DVT in right LE who presented to [facility] on 53/81/16 with palpiations. She was found to be in afib with RVR and and elevated troponin.   1. Elevated troponin - in the settings  of a-fib with RVR with RVR up to 160 BPM, troponin 0.04 --> 1.83 --> 2.28--> 1.43. Xarelto was held and she was started in heparin gtt. Now that troponin trending downwards and she is chest pain free, we will discontinue heparin gtt and resume Xarelto.  -- She was restarted on carvedilol, as the patient was not taking it. The last cath in 10/2014 showed diffuse small vessel disease, medical management was recommended, she continues to smoke.  -- Continue ASA, Plavix, lisinoprol, carvedilol, atorvastatin.  2. Paroxysmal a-fib with RVR -cardioverted spontaneously to SR, restarted on home carvedilol (wasn't taking). Her home Xarelto was held and she was started on heparin gtt (in the setting on NSTEMI). Now we will restart Xarelto and discontinue heparin gtt  3. Chronic systolic CHF - LVEF [id number], she is euvolemic. Continue home meds.   4. HTN - controlled.  5. PAD - complex PAD followed by Dr  Gwenlyn Found.  6. NSVT- one run of 5-6 beat noted on tele. Continue BB    7. Tobacco abuse- patient reports only smoking a couple cigs last week and now wants to quit  Dipso- expected discharge tomorrow. I have arranged follow up in clinic for 10/26/15 with [name].   Karla Pimple PA-C  Pager [phone]  The patient was seen, examined and discussed with Karla Sims, NP and I agree with the above.   Elevated troponin - downrending, in the settings of a-fib with RVR with ventricular rate up to 160 BPM, troponin 0.04 --> 1.83 -->2.8-->1.4, this is most probably demand ischemia, she is chest pain free, d/c heparin, restart Xarelto.The last cath in 10/2014 showed diffuse small vessel disease, medical management was recommended, she continues to smoke heavily.  She had a nsVT - 6 beats total overnight, we restarted carvedilol.   She was in a-fib with RVR on admission, this is a paroxysmal atrial fibrillation that is recurrent, not new. Her CHADS-VASc is  Restart carvedilol as she was not  taking it at home. CHADS-VASc is 4.  She is euvolemic and we will plan for discharge in the am.   Dorothy Spark 10/16/2015

## 2015-10-16 NOTE — Progress Notes (Signed)
ANTICOAGULATION CONSULT NOTE - Follow-up Consult  Pharmacy Consult for heparin Indication: chest pain/ACS/afib  Allergies  Allergen Reactions  . Amiodarone Nausea And Vomiting    Patient Measurements: Height: 5\' 4"  (162.6 cm) Weight: 229 lb 4.5 oz (104 kg) IBW/kg (Calculated) : 54.7 Heparin Dosing Weight: 78.9 kg  Vital Signs: Temp: 97.9 F (36.6 C) (12/01 2101) Temp Source: Oral (12/01 2101) BP: 115/65 mmHg (12/01 2101) Pulse Rate: 83 (12/01 2101)  Labs:  Recent Labs  10/15/15 0115 10/15/15 1355 10/15/15 1655 10/15/15 2340 10/16/15 0210  HGB 11.0*  --   --   --   --   HCT 35.3*  --   --   --   --   PLT 346  --   --   --   --   APTT  --  33  --   --  46*  LABPROT 20.0*  --   --   --   --   INR 1.71*  --   --   --   --   HEPARINUNFRC  --  2.20*  --   --  0.68  CREATININE 1.12*  --   --   --   --   TROPONINI  --  1.89* 2.28* 1.43*  --     Estimated Creatinine Clearance: 69 mL/min (by C-G formula based on Cr of 1.12).   Assessment: 54 yo female on Xarelto PTA for afib/hx of clot (last dose 11/30 at ~1800). Xarelto on hold and pt on heparin for elevated trop, afib. Baseline heparin level >2.2 as Xarelto affecting. Will use aPTT to monitor heparin for now (baseline aPTT 33 sec).  APTT 46 sec (subtherapeutic). Heparin level still being affected by Xarelto. No issues with line or bleeding reported per RN.  Goal of Therapy:  Heparin level 0.3-0.7 units/ml aPTT 66-102 seconds Monitor platelets by anticoagulation protocol: Yes   Plan:  - Increase heparin infusion to 1400 units/hr  - Will f/u 6 hr aPTT - F/u cardioversion plans, resumption of Xarelto  Sherlon Handing, PharmD, BCPS Clinical pharmacist, pager [phone] 10/16/2015,2:47 AM

## 2015-10-16 NOTE — Care Management Obs Status (Signed)
[place] NOTIFICATION   Patient Details  Name: [name] MRN: [id number] Date of Birth: [birth year]   Medicare Observation Status Notification Given:  Yes    Dawayne Patricia, RN 10/16/2015, 10:50 AM

## 2015-10-16 NOTE — Telephone Encounter (Signed)
TCM  Appt with Cecille Rubin 12/12 @ 2:30pm   Per PA [place]? She states that the pt will be D/C'd on 10/17/2015

## 2015-10-17 ENCOUNTER — Encounter (HOSPITAL_COMMUNITY): Payer: Self-pay | Admitting: Physician Assistant

## 2015-10-17 DIAGNOSIS — I429 Cardiomyopathy, unspecified: Secondary | ICD-10-CM | POA: Diagnosis not present

## 2015-10-17 DIAGNOSIS — I5023 Acute on chronic systolic (congestive) heart failure: Secondary | ICD-10-CM

## 2015-10-17 DIAGNOSIS — I481 Persistent atrial fibrillation: Secondary | ICD-10-CM | POA: Diagnosis not present

## 2015-10-17 DIAGNOSIS — I4891 Unspecified atrial fibrillation: Secondary | ICD-10-CM | POA: Diagnosis not present

## 2015-10-17 DIAGNOSIS — I248 Other forms of acute ischemic heart disease: Secondary | ICD-10-CM | POA: Diagnosis not present

## 2015-10-17 DIAGNOSIS — I480 Paroxysmal atrial fibrillation: Secondary | ICD-10-CM | POA: Diagnosis not present

## 2015-10-17 LAB — CBC
HCT: 31.2 % — ABNORMAL LOW (ref 36.0–46.0)
Hemoglobin: 9.6 g/dL — ABNORMAL LOW (ref 12.0–15.0)
MCH: 27.4 pg (ref 26.0–34.0)
MCHC: 30.8 g/dL (ref 30.0–36.0)
MCV: 88.9 fL (ref 78.0–100.0)
Platelets: 285 10*3/uL (ref 150–400)
RBC: 3.51 MIL/uL — ABNORMAL LOW (ref 3.87–5.11)
RDW: 13.7 % (ref 11.5–15.5)
WBC: 10.8 10*3/uL — ABNORMAL HIGH (ref 4.0–10.5)

## 2015-10-17 LAB — GLUCOSE, CAPILLARY
Glucose-Capillary: 153 mg/dL — ABNORMAL HIGH (ref 65–99)
Glucose-Capillary: 294 mg/dL — ABNORMAL HIGH (ref 65–99)

## 2015-10-17 NOTE — Progress Notes (Signed)
Patient to D/C home with sister. Education done. IV removed. Tele box removed. VSS. Personal belongings given sister.   Domingo Dimes RN

## 2015-10-17 NOTE — Progress Notes (Signed)
SUBJECTIVE: The patient is doing well today.  She feels "better" and wants to go home.  At this time, she denies chest pain, shortness of breath, or any new concerns.  [name] aspirin EC  81 mg Oral Daily  . atorvastatin  40 mg Oral Daily  . carvedilol  37.5 mg Oral BID WC  . clopidogrel  75 mg Oral Q breakfast  . furosemide  40 mg Oral BID  . gabapentin  300 mg Oral QID  . glipiZIDE  20 mg Oral Q breakfast  . insulin aspart  0-15 Units Subcutaneous TID WC  . insulin glargine  70 Units Subcutaneous q morning - 10a  . levothyroxine  50 mcg Oral Daily  . lisinopril  20 mg Oral Daily  . pantoprazole  40 mg Oral Daily  . rivaroxaban  20 mg Oral Q supper  . sodium chloride  3 mL Intravenous Q12H   . sodium chloride 125 mL/hr at 10/15/15 0439    OBJECTIVE: Physical Exam: Filed Vitals:   10/16/15 1315 10/16/15 2117 10/17/15 0519 10/17/15 1013  BP: 104/59 136/59 105/50 110/54  Pulse: 63 66 74   Temp: 97.9 F (36.6 C) 98 F (36.7 C) 97.7 F (36.5 C)   TempSrc: Oral Oral Oral   Resp: 18 18 18    Height:      Weight:   231 lb 11.2 oz (105.098 kg)   SpO2: 97% 100% 100%     Intake/Output Summary (Last 24 hours) at 10/17/15 1034 Last data filed at 10/17/15 0157  Gross per 24 hour  Intake    840 ml  Output   2050 ml  Net  -1210 ml    Telemetry reveals rate controlled afib  GEN- The patient is morbidly obese and chronically ill appearing, alert and oriented x 3 today.   Head- normocephalic, atraumatic Eyes-  Sclera clear, conjunctiva pink Ears- hearing intact Oropharynx- clear Neck- supple,   Lungs- decreased BS at the bases, normal work of breathing Heart- irregular rate and rhythm  GI- soft, NT, ND, + BS Extremities- no clubbing, cyanosis, + dependant edema Skin- no rash or lesion Psych- euthymic mood, full affect Neuro- strength and sensation are intact  LABS: Basic Metabolic Panel:  Recent Labs  10/15/15 0115 10/15/15 1355 10/16/15 0514  NA 136  --  136  K 3.8   --  4.1  CL 94*  --  94*  CO2 31  --  30  GLUCOSE 355*  --  184*  BUN 25*  --  31*  CREATININE 1.12*  --  1.23*  CALCIUM 9.2  --  8.9  MG  --  1.4*  --    Liver Function Tests:  Recent Labs  10/15/15 0115 10/16/15 0514  AST 63* 54*  ALT 99* 81*  ALKPHOS 277* 204*  BILITOT 0.4 0.4  PROT 7.1 6.4*  ALBUMIN 3.3* 2.8*   No results for input(s): LIPASE, AMYLASE in the last 72 hours. CBC:  Recent Labs  10/16/15 0514 10/17/15 0315  WBC 11.9* 10.8*  HGB 9.5* 9.6*  HCT 30.1* 31.2*  MCV 89.1 88.9  PLT 277 285   Cardiac Enzymes:  Recent Labs  10/15/15 1355 10/15/15 1655 10/15/15 2340  TROPONINI 1.89* 2.28* 1.43*   BNP: Invalid input(s): POCBNP D-Dimer: No results for input(s): DDIMER in the last 72 hours. Hemoglobin A1C:  Recent Labs  10/15/15 1355  HGBA1C 11.1*   Fasting Lipid Panel:  Recent Labs  10/16/15 0514  CHOL 160  HDL 35*  [place] 17  TRIG 246*  CHOLHDL 4.6    ASSESSMENT AND PLAN Ms. Wan is a 54 year old obese female with h/o PAD, heavy smoker, cocaine abuse, alcoholic cirrhosis, with CMP, cardiomyopathy paroxysmal atrial fibrillation, DM, HTN, DVT in right LE who presented to [facility] on 12/81/16 with palpiations. She was found to be in afib with RVR and and elevated troponin.   1. Elevated troponin - i Demand ischemia Conservative management Lifestyle modification she continues to smoke.  Continue ASA, Plavix, lisinoprol, carvedilol, atorvastatin.  2. Persistent a-fib with RVR Would advise rate control long term  Poor candidate for antiarrhythmic drugs Could consider Genetic AF study down the road if she demonstrates compliance with therapies I am doubtful that we can maintain sinus rhythm long term in this patient Not an ablation candidate chads2vasc score is 4 Resume xarelto  3. Chronic systolic CHF - LVEF [id number], she is near euvolemic.   4. HTN - controlled.  5. PAD - complex PAD followed by Dr Gwenlyn Found.  6. Tobacco abuse- she  is resistant to cessation  7. Obesity Body mass index is 39.75 kg/(m^2). Weight reduction advised  Dipso- discharge to home today,   Needs transition of care Noted follow up in clinic for 10/26/15 with Truitt Merle.   She is at very high risk for rehospitalization/ decompensation over time Without lifestyle modification, her lifespan will no doubt be shortened.   Thompson Grayer, MD 10/17/2015 10:34 AM

## 2015-10-17 NOTE — Discharge Summary (Signed)
Discharge Summary   Patient ID: [name] MRN: [id number], DOB/AGE: 53-Aug-1962 53 y.o. Admit date: 10/15/2015 D/C date:     10/17/2015  Primary Cardiologist: Dr. Meda Coffee  Principal Problem:   Atrial fibrillation with RVR [name] Active Problems:   Demand ischemia ([place])   Alcohol abuse   Alcoholic cirrhosis ([place])   Cigarette smoker   Hypothyroidism   DM type 2, uncontrolled, with neuropathy ([place])   Claudication ([place])   PAF (paroxysmal atrial fibrillation) ([place])   Carotid arterial disease ([place])   COPD (chronic obstructive pulmonary disease) ([place])   Acute on chronic combined systolic and diastolic heart failure ([place])    Admission Dates: 10/15/15-10/17/15 Discharge Diagnosis: Afib with RVR and troponin elevation felt to be due to demand ischemia.   HPI: [name] is a 54 y.o. female with a history of h/o PAD, heavy smoker, cocaine abuse, alcoholic cirrhosis, with CMP, cardiomyopathy paroxysmal atrial fibrillation, DM, HTN, DVT in right LE who presented to [facility] on 12/81/16 with palpiations. She was found to be in afib with RVR and and elevated troponin.   She's never had a heart attack or stroke. She had paroxysmal atrial fibrillation in the past and has undergone cardioversion in [place]. The lower extremity arterial Doppler studies performed in our office suggested critical limb ischemia with an occluded SFA and popliteal arteries bilaterally with tibial vessel disease as well. She complains of lifestyle limiting claudication. She underwent cardiac catheterization on 10/31/14 revealing an ejection fraction of 30-35% with disease that was ultimately treated medically. Her carotid Doppler showed high-grade bilateral ICA disease and lower extremity dopplers revealed restenosis within the right SFA with a ABI of 0.34. She underwent left carotid endarterectomy by Dr. Trula Slade on 02/19/15 which was uncomplicated.  Lower extremity Doppler studies performed 01/23/15 revealed ABIs in the  0.6 range bilaterally with a patent right SFA. She was recently admitted to [facility] because of atrial fibrillation with RVR (10/22-10/24/16). Discharged with Coreg. She then represented to [facility] ED with palpitations and found to be in recurrent afib with RVR in the setting of not taking her coreg. She states that she never picked up carvedilol after she was discharge for a similar episode in October 2016. She also was noted to have elevated troponin but no chest pain. She spontaneously converted to NSR and was restarted on Coreg.   Hospital Course  1. Elevated troponin - in the settings of a-fib with RVR with RVR up to 160 BPM, troponin 0.04 --> 1.83 --> 2.28--> 1.43 felt to be c/w with demand ischemia. Xarelto was held and she was started in heparin gtt. She was resumed on Xarelto on 10/16/15 with troponin trending downwards and chest pain free. -- She was restarted on carvedilol, as the patient was not taking it. The last cath in 10/2014 showed diffuse small vessel disease, medical management was recommended, she continues to smoke.  -- Continue ASA, Plavix, lisinoprol, carvedilol, atorvastatin.  2. Paroxysmal a-fib with RVR -cardioverted spontaneously to SR, restarted on home carvedilol (wasn't taking).  -- CHADSVASC score of 4. Her home Xarelto was held and she was started on heparin gtt (in the setting on NSTEMI). She was chest pain free and troponin downtrending so she was restarted on Xarelto  -- Dr Rayann Heman saw today and advised rate control for long term as she is a poor candidate for antiarrhythmic drugs and not an ablation candidate. He was doubtful the NSR can be maintained in this patient. Could consider Genetic  AF study down the road if she demonstrates compliance with therapies  3. Chronic combined systolic/diastolic CHF - LVEF 09-60%, she is euvolemic. Continue home meds.   4. HTN - controlled.  5. PAD - complex PAD followed by Dr Gwenlyn Found.  6. NSVT- one run of 5-6 beat noted  on tele on 10/16/15. No further episodes. -- Continue Coreg    7. Tobacco abuse- she is resistant to cessation  6. Obesity- Body mass index is 39.75 kg/(m^2).  -- Weight loss advised.   7. DM- uncontrolled. HgA1c 11  Dipso- she has follow up in clinic for 10/26/15 with Truitt Merle. She is at very high risk for rehospitalization/ decompensation over time. Without lifestyle modification, her lifespan will no doubt be shortened per Dr. Rayann Heman  The patient has had an uncomplicated hospital course and is recovering well. She has been seen by Dr. Rayann Heman today and deemed ready for discharge home. All follow-up appointments have been scheduled. Smoking cessation was disscussed in length. Discharge medications are listed below.   Discharge Vitals: Blood pressure 110/54, pulse 74, temperature 97.7 F (36.5 C), temperature source Oral, resp. rate 18, height '5\' 4"'  (1.626 m), weight 231 lb 11.2 oz (105.098 kg), last menstrual period 11/27/2012, SpO2 100 %.  Labs: Lab Results  Component Value Date   WBC 10.8* 10/17/2015   HGB 9.6* 10/17/2015   HCT 31.2* 10/17/2015   MCV 88.9 10/17/2015   PLT 285 10/17/2015     Recent Labs Lab 10/16/15 0514  NA 136  K 4.1  CL 94*  CO2 30  BUN 31*  CREATININE 1.23*  CALCIUM 8.9  PROT 6.4*  BILITOT 0.4  ALKPHOS 204*  ALT 81*  AST 54*  GLUCOSE 184*    Recent Labs  10/15/15 1355 10/15/15 1655 10/15/15 2340  TROPONINI 1.89* 2.28* 1.43*   Lab Results  Component Value Date   CHOL 160 10/16/2015   HDL 35* 10/16/2015   LDLCALC 76 10/16/2015   TRIG 246* 10/16/2015   Lab Results  Component Value Date   DDIMER 0.48 10/29/2014    Diagnostic Studies/Procedures   Dg Chest Portable 1 View  10/15/2015  CLINICAL DATA:  Chest pain and dyspnea, onset tonight. EXAM: PORTABLE CHEST 1 VIEW COMPARISON:  09/04/2015 FINDINGS: A single AP portable view of the chest demonstrates no focal airspace consolidation or alveolar edema. The lungs are grossly  clear. There is no large effusion or pneumothorax. Cardiac and mediastinal contours appear unremarkable. IMPRESSION: No active disease. Electronically Signed   By: Andreas Newport M.D.   On: 10/15/2015 01:55    Discharge Medications     Medication List    TAKE these medications        ACCU-CHEK AVIVA PLUS W/DEVICE Kit  1 Device by Does not apply route 4 (four) times daily -  before meals and at bedtime.     accu-chek soft touch lancets  Use as instructed     acetaminophen-codeine 300-30 MG tablet  Commonly known as:  TYLENOL #3  Take 1 tablet by mouth every 4 (four) hours as needed for moderate pain.     albuterol (2.5 MG/3ML) 0.083% nebulizer solution  Commonly known as:  PROVENTIL  Take 3 mLs (2.5 mg total) by nebulization every 6 (six) hours as needed. For wheezing.     albuterol 108 (90 BASE) MCG/ACT inhaler  Commonly known as:  PROVENTIL HFA;VENTOLIN HFA  Inhale 2 puffs into the lungs every 6 (six) hours as needed for wheezing or shortness of breath.  atorvastatin 40 MG tablet  Commonly known as:  LIPITOR  Take 1 tablet (40 mg total) by mouth daily.     carvedilol 12.5 MG tablet  Commonly known as:  COREG  Take 3 tablets (37.5 mg total) by mouth 2 (two) times daily with a meal.     clopidogrel 75 MG tablet  Commonly known as:  PLAVIX  Take 1 tablet (75 mg total) by mouth daily with breakfast.     cyclobenzaprine 10 MG tablet  Commonly known as:  FLEXERIL  Take 1 tablet (10 mg total) by mouth 3 (three) times daily as needed for muscle spasms.     furosemide 40 MG tablet  Commonly known as:  LASIX  Take 1 tablet (40 mg total) by mouth 2 (two) times daily.     glipiZIDE 10 MG 24 hr tablet  Commonly known as:  GLUCOTROL XL  Take 2 tablets (20 mg total) by mouth daily with breakfast.     glucose blood test strip  Commonly known as:  ACCU-CHEK AVIVA PLUS  Use as instructed     GRALISE 300 MG Tabs  Generic drug:  Gabapentin (Once-Daily)  Take 1 tablet by  mouth 4 (four) times daily.     insulin aspart 100 UNIT/ML FlexPen  Commonly known as:  NOVOLOG  Sliding scale     insulin glargine 100 UNIT/ML injection  Commonly known as:  LANTUS  Inject 0.7 mLs (70 Units total) into the skin every morning.     Insulin Pen Needle 32G X 4 MM Misc  Commonly known as:  ULTICARE MICRO PEN NEEDLES  1 Syringe by Does not apply route 4 (four) times daily - after meals and at bedtime.     levothyroxine 50 MCG tablet  Commonly known as:  SYNTHROID, LEVOTHROID  Take 1 tablet (50 mcg total) by mouth daily.     lisinopril 20 MG tablet  Commonly known as:  PRINIVIL,ZESTRIL  Take 1 tablet (20 mg total) by mouth daily.     metFORMIN 500 MG tablet  Commonly known as:  GLUCOPHAGE  Take 500 mg by mouth 2 (two) times daily with a meal.     metoCLOPramide 5 MG tablet  Commonly known as:  REGLAN  Take 5 mg by mouth every 6 (six) hours as needed for nausea.     nitroGLYCERIN 0.4 MG SL tablet  Commonly known as:  NITROSTAT  Place 1 tablet (0.4 mg total) under the tongue every 5 (five) minutes as needed for chest pain.     pantoprazole 40 MG tablet  Commonly known as:  PROTONIX  Take 1 tablet (40 mg total) by mouth daily.     promethazine 12.5 MG tablet  Commonly known as:  PHENERGAN  Take 12.5 mg by mouth every 6 (six) hours as needed for nausea or vomiting.     rivaroxaban 20 MG Tabs tablet  Commonly known as:  XARELTO  Take 1 tablet (20 mg total) by mouth daily with supper.     traMADol 50 MG tablet  Commonly known as:  ULTRAM  Take 1 tablet (50 mg total) by mouth every 12 (twelve) hours as needed for moderate pain.        Disposition   The patient will be discharged in stable condition to home.  Follow-up Information    Follow up with Truitt Merle, NP On 10/26/2015.   Specialties:  Nurse Practitioner, Interventional Cardiology, Cardiology, Radiology   Why:  @ 2:30 pm    Contact information:  [place]. [address] [place]  [address] [phone]         Duration of Discharge Encounter: Greater than 30 minutes including physician and PA time.  SignedAngelena Form R PA-C 10/17/2015, 11:04 AM   I have seen, examined the patient, and reviewed the above assessment and plan.  Changes to above are made where necessary.    Co Sign: Thompson Grayer, MD 10/17/2015 1:08 PM

## 2015-10-19 ENCOUNTER — Encounter (HOSPITAL_COMMUNITY)
Admission: RE | Admit: 2015-10-19 | Discharge: 2015-10-19 | Disposition: A | Discharge status: Home / Self Care | Payer: Medicare Other | Source: Ambulatory Visit | Attending: Cardiovascular Disease | Admitting: Cardiovascular Disease

## 2015-10-19 ENCOUNTER — Ambulatory Visit (HOSPITAL_COMMUNITY): Payer: Medicaid Other

## 2015-10-19 ENCOUNTER — Other Ambulatory Visit: Payer: Self-pay | Admitting: Family Medicine

## 2015-10-19 ENCOUNTER — Telehealth: Payer: Self-pay | Admitting: Cardiovascular Disease

## 2015-10-19 DIAGNOSIS — I429 Cardiomyopathy, unspecified: Secondary | ICD-10-CM | POA: Insufficient documentation

## 2015-10-19 NOTE — Telephone Encounter (Signed)
New message      Talk to the nurse regarding discharge medication carvedilol.  She does not have a presc for it and she has a question regarding the dosage and instructions.  Pt will be at cardiac rehab for a few more minutes

## 2015-10-19 NOTE — Telephone Encounter (Signed)
Reviewed with Kathrene Alu-- Per Lori-continue coreg 6.25mg  bid for now, keep appt scheduled with Jana Half 10/22/15.   LMTCB for pt.

## 2015-10-19 NOTE — Telephone Encounter (Signed)
I spoke with Joann at Cardiac Rehab. Arville Go states pt was discharged from hospital recently on coreg 37.5mg  bid. Joann  states pt had been taking coreg 6.25mg  bid prior to hospitalization. Joann states discharge summary states pt had not been taking coreg but pt had been taking it.   Arville Go states that pt will not get new prescription for coreg 37.5mg  bid until 10/21/15, she has continued coreg 6.25mg  bid since discharge from hospital. Arville Go states today at Cardiac Rehab BP not checked, heart rate was palpated in the 60s and regular.  Joann states pt denies symptoms, including lightheadedness.  Joann calling to clarify coreg dose since pt has been taking coreg 6.25mg  bid not coreg 37.5mg  bid since discharge from hospital recently.

## 2015-10-19 NOTE — Telephone Encounter (Signed)
Patient contacted regarding discharge from [facility] on October 17, 2015.  Patient understands to follow up with provider Richardson Dopp, PA-C on October 22, 2015 at 8:30AM at [facility] location. Patient understands discharge instructions? yes Patient understands medications and regiment? yes Patient understands to bring all medications to this visit? yes  Pt was originally scheduled for 12/12 with Truitt Merle.  Pt states that she is going out of town on 12/10.  Rescheduled pt for 12/8 with Richardson Dopp, PA-C.

## 2015-10-19 NOTE — Progress Notes (Signed)
Pt arrived at cardiac rehab however clearance to exercise post hospital DC has not been approved.  Pt medication list reviewed.  Pt states her sister prepares her weekly pill box and she receives her medications from [place]. Pt expects delivery on 10/21/15.  Hospital DC list reviewed with pt sister via telephone. Pt sister read pill bottle labels to confirm names and dosages. Unfortunately, pt does not  have coreg 37.5mg  as ordered upon hospital discharge.  Pt has coreg 6.25mg .  Contacted pt pharmacy to assess order.  Most recent medication order on file is from 09/07/15 by [facility] for coreg 6.25mg  BID.  pc to Dr. Kyla Balzarine office to clarify and request prescription order to pt pharmacy with appropriate dosage.  Lelon Frohlich will review with MD and further advise pt.  Per telephone note pt advised to continue coreg 6.25mg  BID until office appt 10/22/15 with Richardson Dopp.  Pt instructed to bring medication bottles and pharmacy information with her to office visit.   Pt also instructed to not return to cardiac rehab until MD clearance obtained.  Pt verbalized understanding.

## 2015-10-20 ENCOUNTER — Emergency Department (HOSPITAL_COMMUNITY): Payer: Medicare Other

## 2015-10-20 ENCOUNTER — Other Ambulatory Visit: Payer: Self-pay | Admitting: Family Medicine

## 2015-10-20 ENCOUNTER — Encounter (HOSPITAL_COMMUNITY): Payer: Self-pay | Admitting: Emergency Medicine

## 2015-10-20 ENCOUNTER — Inpatient Hospital Stay (HOSPITAL_COMMUNITY)
Admission: EM | Admit: 2015-10-20 | Discharge: 2015-10-23 | DRG: 309 | Disposition: A | Discharge status: Home / Self Care | Payer: Medicare Other | Attending: Cardiovascular Disease | Admitting: Cardiovascular Disease

## 2015-10-20 DIAGNOSIS — K219 Gastro-esophageal reflux disease without esophagitis: Secondary | ICD-10-CM | POA: Diagnosis present

## 2015-10-20 DIAGNOSIS — E1142 Type 2 diabetes mellitus with diabetic polyneuropathy: Secondary | ICD-10-CM | POA: Diagnosis present

## 2015-10-20 DIAGNOSIS — E039 Hypothyroidism, unspecified: Secondary | ICD-10-CM | POA: Diagnosis present

## 2015-10-20 DIAGNOSIS — I480 Paroxysmal atrial fibrillation: Secondary | ICD-10-CM | POA: Diagnosis not present

## 2015-10-20 DIAGNOSIS — Z720 Tobacco use: Secondary | ICD-10-CM

## 2015-10-20 DIAGNOSIS — I779 Disorder of arteries and arterioles, unspecified: Secondary | ICD-10-CM | POA: Diagnosis present

## 2015-10-20 DIAGNOSIS — I5042 Chronic combined systolic (congestive) and diastolic (congestive) heart failure: Secondary | ICD-10-CM | POA: Diagnosis present

## 2015-10-20 DIAGNOSIS — E669 Obesity, unspecified: Secondary | ICD-10-CM | POA: Diagnosis present

## 2015-10-20 DIAGNOSIS — Z7902 Long term (current) use of antithrombotics/antiplatelets: Secondary | ICD-10-CM

## 2015-10-20 DIAGNOSIS — Z6839 Body mass index (BMI) 39.0-39.9, adult: Secondary | ICD-10-CM

## 2015-10-20 DIAGNOSIS — I2510 Atherosclerotic heart disease of native coronary artery without angina pectoris: Secondary | ICD-10-CM | POA: Diagnosis present

## 2015-10-20 DIAGNOSIS — F17200 Nicotine dependence, unspecified, uncomplicated: Secondary | ICD-10-CM | POA: Diagnosis present

## 2015-10-20 DIAGNOSIS — R002 Palpitations: Secondary | ICD-10-CM | POA: Diagnosis not present

## 2015-10-20 DIAGNOSIS — K7030 Alcoholic cirrhosis of liver without ascites: Secondary | ICD-10-CM | POA: Diagnosis present

## 2015-10-20 DIAGNOSIS — I2489 Other forms of acute ischemic heart disease: Secondary | ICD-10-CM | POA: Diagnosis present

## 2015-10-20 DIAGNOSIS — Z9114 Patient's other noncompliance with medication regimen: Secondary | ICD-10-CM

## 2015-10-20 DIAGNOSIS — F419 Anxiety disorder, unspecified: Secondary | ICD-10-CM | POA: Diagnosis present

## 2015-10-20 DIAGNOSIS — I110 Hypertensive heart disease with heart failure: Secondary | ICD-10-CM | POA: Diagnosis present

## 2015-10-20 DIAGNOSIS — F1010 Alcohol abuse, uncomplicated: Secondary | ICD-10-CM | POA: Diagnosis present

## 2015-10-20 DIAGNOSIS — Z79899 Other long term (current) drug therapy: Secondary | ICD-10-CM

## 2015-10-20 DIAGNOSIS — E1165 Type 2 diabetes mellitus with hyperglycemia: Secondary | ICD-10-CM | POA: Diagnosis present

## 2015-10-20 DIAGNOSIS — I739 Peripheral vascular disease, unspecified: Secondary | ICD-10-CM | POA: Diagnosis present

## 2015-10-20 DIAGNOSIS — E785 Hyperlipidemia, unspecified: Secondary | ICD-10-CM | POA: Diagnosis present

## 2015-10-20 DIAGNOSIS — I248 Other forms of acute ischemic heart disease: Secondary | ICD-10-CM | POA: Diagnosis present

## 2015-10-20 DIAGNOSIS — Z91148 Patient's other noncompliance with medication regimen for other reason: Secondary | ICD-10-CM | POA: Diagnosis present

## 2015-10-20 DIAGNOSIS — I4891 Unspecified atrial fibrillation: Secondary | ICD-10-CM | POA: Diagnosis present

## 2015-10-20 DIAGNOSIS — I255 Ischemic cardiomyopathy: Secondary | ICD-10-CM | POA: Diagnosis present

## 2015-10-20 DIAGNOSIS — I6529 Occlusion and stenosis of unspecified carotid artery: Secondary | ICD-10-CM | POA: Diagnosis present

## 2015-10-20 DIAGNOSIS — J449 Chronic obstructive pulmonary disease, unspecified: Secondary | ICD-10-CM | POA: Diagnosis present

## 2015-10-20 DIAGNOSIS — Z7984 Long term (current) use of oral hypoglycemic drugs: Secondary | ICD-10-CM

## 2015-10-20 DIAGNOSIS — Z794 Long term (current) use of insulin: Secondary | ICD-10-CM

## 2015-10-20 LAB — BASIC METABOLIC PANEL
ANION GAP: 12 (ref 5–15)
BUN: 25 mg/dL — AB (ref 6–20)
CHLORIDE: 97 mmol/L — AB (ref 101–111)
CO2: 27 mmol/L (ref 22–32)
Calcium: 9.6 mg/dL (ref 8.9–10.3)
Creatinine, Ser: 1.48 mg/dL — ABNORMAL HIGH (ref 0.44–1.00)
GFR calc Af Amer: 46 mL/min — ABNORMAL LOW (ref >60)
GFR calc non Af Amer: 40 mL/min — ABNORMAL LOW (ref >60)
Glucose, Bld: 321 mg/dL — ABNORMAL HIGH (ref 65–99)
POTASSIUM: 3.6 mmol/L (ref 3.5–5.1)
SODIUM: 136 mmol/L (ref 135–145)

## 2015-10-20 LAB — CBC
HEMATOCRIT: 35.5 % — AB (ref 36.0–46.0)
HEMOGLOBIN: 11.3 g/dL — AB (ref 12.0–15.0)
MCH: 28.4 pg (ref 26.0–34.0)
MCHC: 31.8 g/dL (ref 30.0–36.0)
MCV: 89.2 fL (ref 78.0–100.0)
Platelets: 329 10*3/uL (ref 150–400)
RBC: 3.98 MIL/uL (ref 3.87–5.11)
RDW: 13.7 % (ref 11.5–15.5)
WBC: 11.9 10*3/uL — AB (ref 4.0–10.5)

## 2015-10-20 LAB — I-STAT TROPONIN, ED: TROPONIN I, POC: 0.09 ng/mL — AB (ref 0.00–0.08)

## 2015-10-20 LAB — MAGNESIUM: MAGNESIUM: 1.5 mg/dL — AB (ref 1.7–2.4)

## 2015-10-20 LAB — APTT: aPTT: 35 seconds (ref 24–37)

## 2015-10-20 MED ORDER — DILTIAZEM HCL 100 MG IV SOLR
5.0000 mg/h | Freq: Once | INTRAVENOUS | Status: AC
  Administered 2015-10-21: 5 mg/h via INTRAVENOUS
  Filled 2015-10-20: qty 100

## 2015-10-20 MED ORDER — MAGNESIUM SULFATE 2 GM/50ML IV SOLN
2.0000 g | Freq: Once | INTRAVENOUS | Status: AC
  Administered 2015-10-20: 2 g via INTRAVENOUS
  Filled 2015-10-20: qty 50

## 2015-10-20 NOTE — Telephone Encounter (Signed)
Notified [name] at Cardiac Rehab that [name] spoke with [name] regarding the [name].  She states she saw response in epic and will advise pt.

## 2015-10-20 NOTE — ED Notes (Signed)
Pt arrives via EMS from home with afib, upon EMS arrival pt's HR in 220s, received 20 MG Cardizem and went to 180s, then received 10 MG more Cardizem and was in 130s. Pt's rate back up to 160s. Alert and oriented x4, ambulatory.

## 2015-10-20 NOTE — ED Provider Notes (Signed)
CSN: [id number]     Arrival date & time 10/20/15  2313 History  By signing my name below, I, [name], attest that this documentation has been prepared under the direction and in the presence of [name], MD. Electronically Signed: [name], ED Scribe. 10/20/2015. 11:42 PM.      Chief Complaint  Patient presents with  . Atrial Fibrillation      The history is provided by the patient. No language interpreter was used.   HPI Comments: [name] is a 54 y.o. female who presents to the Emergency Department complaining of sudden onset, moderate, intermittent, gradual improving atrial fibrillation onset this evening. She has associated edema in both of her lower extremities. Pt reports she was sitting on her bed about to lay down after dinner when she began to have palpitations and pain in her neck and shoulders. Pt took 4 aspirin before EMS arrived. She was given 30 mg of Cardizem by EMS and reports minimal relief. Pt denies shortness of breath.  Past Medical History  Diagnosis Date  . Alcohol abuse   . Narcotic abuse   . Cocaine abuse   . Marijuana abuse   . Tobacco abuse   . Alcoholic cirrhosis ([place])   . Cardiomyopathy ([place])   . Obesity   . Chronic systolic CHF (congestive heart failure) ([place])     a. Last echo 12/205: EF 40-45%, inferoapical/posterior HK, not technically sufficient to allow eval of LV diastolic function, normal LA in size..  . Hypothyroidism   . GERD (gastroesophageal reflux disease)   . PAF (paroxysmal atrial fibrillation) ([place])   . Critical lower limb ischemia   . Hypertension   . CAD (coronary artery disease)     a. cath 11/10/2014 small vessel CAD. Demand ischemia in the setting of rapid a-fib  . Anxiety   . Poorly controlled type 2 diabetes mellitus ([place])   . Hyperlipemia   . Peripheral arterial disease ([place])     a. 01/2015 Angio/PTA: LSFA 100 w/ recon @ adductor canal and 1 vessel runoff via AT, RSFA 99 (atherectomy/pta) - 1 vessel runoff via  diff dzs peroneal.  . Carotid artery disease ([place])     a. 01/2015 Carotid Angio: RICA 502, LICA 77A. s/p L carotid endarterectomy 02/2015.  [name] COPD (chronic obstructive pulmonary disease) ([place])   . Diabetic peripheral neuropathy [facility])    Past Surgical History  Procedure Laterality Date  . Cardioversion  ~ 02/2013    "twice"   . Lower extremity angiogram N/A 09/10/2013    Procedure: LOWER EXTREMITY ANGIOGRAM;  Surgeon: [name], MD;  Location: [facility] CATH LAB;  Service: Cardiovascular;  Laterality: N/A;  . Left heart catheterization with coronary angiogram N/A 10/31/2014    Procedure: LEFT HEART CATHETERIZATION WITH CORONARY ANGIOGRAM;  Surgeon: Burnell Blanks, MD;  Location: [facility] CATH LAB;  Service: Cardiovascular;  Laterality: N/A;  . Peripheral athrectomy Right 01/15/2015    SFA/notes 01/15/2015  . Balloon angioplasty, artery Right 01/15/2015    SFA/notes 01/15/2015  . Cardiac catheterization    . Lower extremity angiogram N/A 01/15/2015    Procedure: LOWER EXTREMITY ANGIOGRAM;  Surgeon: [name], MD;  Location: [facility] CATH LAB;  Service: Cardiovascular;  Laterality: N/A;  . Carotid angiogram N/A 01/15/2015    Procedure: CAROTID ANGIOGRAM;  Surgeon: [name], MD;  Location: [facility] CATH LAB;  Service: Cardiovascular;  Laterality: N/A;  . Endarterectomy Left 02/19/2015    Procedure: LEFT CAROTID ENDARTERECTOMY ;  Surgeon: Butch Penny  Trula Slade, MD;  Location: [place] OR;  Service: Vascular;  Laterality: Left;  . Dilation and curettage of uterus  1988   Family History  Problem Relation Age of Onset  . Hypertension Mother   . Diabetes Mother   . Cancer Mother     breast, ovarian, colon  . Clotting disorder Mother   . Heart disease Mother   . Heart attack Mother   . Hypertension Father   . Heart disease Father   . Emphysema Sister     smoked   Social History  Substance Use Topics  . Smoking status: Current Some Day Smoker -- 0.00 packs/day for 40 years    Types: E-cigarettes    Last  Attempt to Quit: 02/19/2015  . Smokeless tobacco: Never Used  . Alcohol Use: No   OB History    Gravida Para Term Preterm AB TAB SAB Ectopic Multiple Living   '1 1 1       1     ' Review of Systems  A complete 10 system review of systems was obtained and all systems are negative except as noted in the HPI and PMH.    Allergies  Amiodarone  Home Medications   Prior to Admission medications   Medication Sig Start Date End Date Taking? Authorizing Provider  acetaminophen-codeine (TYLENOL #3) 300-30 MG tablet Take 1 tablet by mouth every 4 (four) hours as needed for moderate pain.    Historical Provider, MD  albuterol (PROVENTIL HFA;VENTOLIN HFA) 108 (90 BASE) MCG/ACT inhaler Inhale 2 puffs into the lungs every 6 (six) hours as needed for wheezing or shortness of breath. 06/30/15   Arnoldo Morale, MD  albuterol (PROVENTIL) (2.5 MG/3ML) 0.083% nebulizer solution Take 3 mLs (2.5 mg total) by nebulization every 6 (six) hours as needed. For wheezing. Patient not taking: Reported on 10/15/2015 06/30/15   Arnoldo Morale, MD  atorvastatin (LIPITOR) 40 MG tablet Take 1 tablet (40 mg total) by mouth daily. 06/30/15   Arnoldo Morale, MD  Blood Glucose Monitoring Suppl (ACCU-CHEK AVIVA PLUS) W/DEVICE KIT 1 Device by Does not apply route 4 (four) times daily -  before meals and at bedtime. 07/21/15   Arnoldo Morale, MD  carvedilol (COREG) 12.5 MG tablet Take 3 tablets (37.5 mg total) by mouth 2 (two) times daily with a meal. 09/07/15   Mechele Claude, DO  clopidogrel (PLAVIX) 75 MG tablet Take 1 tablet (75 mg total) by mouth daily with breakfast. 06/30/15   Arnoldo Morale, MD  cyclobenzaprine (FLEXERIL) 10 MG tablet Take 1 tablet (10 mg total) by mouth 3 (three) times daily as needed for muscle spasms. Patient not taking: Reported on 10/15/2015 07/21/15   Arnoldo Morale, MD  furosemide (LASIX) 40 MG tablet Take 1 tablet (40 mg total) by mouth 2 (two) times daily. 09/07/15   Mechele Claude, DO  Gabapentin,  Once-Daily, (GRALISE) 300 MG TABS Take 1 tablet by mouth 4 (four) times daily.    Historical Provider, MD  glipiZIDE (GLUCOTROL XL) 10 MG 24 hr tablet Take 2 tablets (20 mg total) by mouth daily with breakfast. 06/30/15   Arnoldo Morale, MD  glucose blood (ACCU-CHEK AVIVA PLUS) test strip Use as instructed 07/21/15   Arnoldo Morale, MD  insulin aspart (NOVOLOG) 100 UNIT/ML FlexPen Sliding scale Patient not taking: Reported on 10/15/2015 06/30/15   Arnoldo Morale, MD  insulin glargine (LANTUS) 100 UNIT/ML injection Inject 0.7 mLs (70 Units total) into the skin every morning. 09/07/15   Mechele Claude, DO  Insulin Pen  Needle (ULTICARE MICRO PEN NEEDLES) 32G X 4 MM MISC 1 Syringe by Does not apply route 4 (four) times daily - after meals and at bedtime. 06/30/15   Arnoldo Morale, MD  Lancets (ACCU-CHEK SOFT TOUCH) lancets Use as instructed 07/21/15   Arnoldo Morale, MD  levothyroxine (SYNTHROID, LEVOTHROID) 50 MCG tablet Take 1 tablet (50 mcg total) by mouth daily. 06/30/15   Arnoldo Morale, MD  lisinopril (PRINIVIL,ZESTRIL) 20 MG tablet Take 1 tablet (20 mg total) by mouth daily. 06/30/15   Arnoldo Morale, MD  metFORMIN (GLUCOPHAGE) 500 MG tablet Take 500 mg by mouth 2 (two) times daily with a meal.    Historical Provider, MD  metoCLOPramide (REGLAN) 5 MG tablet Take 5 mg by mouth every 6 (six) hours as needed for nausea.    Historical Provider, MD  nitroGLYCERIN (NITROSTAT) 0.4 MG SL tablet Place 1 tablet (0.4 mg total) under the tongue every 5 (five) minutes as needed for chest pain. 01/16/15   Rogelia Mire, NP  pantoprazole (PROTONIX) 40 MG tablet Take 1 tablet (40 mg total) by mouth daily. 09/22/15 09/21/16  Arnoldo Morale, MD  promethazine (PHENERGAN) 12.5 MG tablet Take 12.5 mg by mouth every 6 (six) hours as needed for nausea or vomiting.    Historical Provider, MD  rivaroxaban (XARELTO) 20 MG TABS tablet Take 1 tablet (20 mg total) by mouth daily with supper. 06/30/15   Arnoldo Morale, MD  traMADol (ULTRAM) 50 MG  tablet Take 1 tablet (50 mg total) by mouth every 12 (twelve) hours as needed for moderate pain. Patient not taking: Reported on 10/15/2015 09/07/15   Mechele Claude, DO   Triage vitals: BP 117/82 mmHg  Pulse 65  Temp(Src) 98.3 F (36.8 C) (Oral)  Resp 19  Ht '5\' 4"'  (1.626 m)  Wt 230 lb (104.327 kg)  BMI 39.46 kg/m2  SpO2 96%  LMP 11/27/2012 Physical Exam  Constitutional: She is oriented to person, place, and time. She appears well-developed and well-nourished. No distress.  HENT:  Head: Normocephalic and atraumatic.  Nose: Nose normal.  Mouth/Throat: Oropharynx is clear and moist. No oropharyngeal exudate.  Eyes: Conjunctivae and EOM are normal. Pupils are equal, round, and reactive to light. No scleral icterus.  Neck: Normal range of motion. Neck supple. No JVD present. No tracheal deviation present. No thyromegaly present.  Cardiovascular: Normal heart sounds.  An irregularly irregular rhythm present. Tachycardia present.  Exam reveals no gallop and no friction rub.   No murmur heard. Pulmonary/Chest: Effort normal and breath sounds normal. No respiratory distress. She has no wheezes. She exhibits no tenderness.  Abdominal: Soft. Bowel sounds are normal. She exhibits no distension and no mass. There is no tenderness. There is no rebound and no guarding.  Musculoskeletal: Normal range of motion. She exhibits edema. She exhibits no tenderness.  Bilateral lower extremity edema  Lymphadenopathy:    She has no cervical adenopathy.  Neurological: She is alert and oriented to person, place, and time. No cranial nerve deficit. She exhibits normal muscle tone.  Skin: Skin is warm and dry. No rash noted. No erythema. No pallor.  Nursing note and vitals reviewed.   ED Course  Procedures  DIAGNOSTIC STUDIES: Oxygen Saturation is 96% on RA, adequate by my interpretation.  COORDINATION OF CARE:  11:40 PM Discussed treatment plan with pt at bedside and pt agreed to plan.  Labs  Review Labs Reviewed  BASIC METABOLIC PANEL - Abnormal; Notable for the following:    Chloride 97 (*)  Glucose, Bld 321 (*)    BUN 25 (*)    Creatinine, Ser 1.48 (*)    GFR calc non Af Amer 40 (*)    GFR calc Af Amer 46 (*)    All other components within normal limits  CBC - Abnormal; Notable for the following:    WBC 11.9 (*)    Hemoglobin 11.3 (*)    HCT 35.5 (*)    All other components within normal limits  MAGNESIUM - Abnormal; Notable for the following:    Magnesium 1.5 (*)    All other components within normal limits  I-STAT TROPOININ, ED - Abnormal; Notable for the following:    Troponin i, poc 0.09 (*)    All other components within normal limits  APTT    Imaging Review Dg Chest 2 View  10/21/2015  CLINICAL DATA:  Acute onset chest pain tonight. Atrial fibrillation with rapid ventricular response. Hypertension. EXAM: CHEST  2 VIEW COMPARISON:  10/15/2015 FINDINGS: The heart size and mediastinal contours are within normal limits. Both lungs are clear. The visualized skeletal structures are unremarkable. IMPRESSION: No active cardiopulmonary disease. Electronically Signed   By: Earle Gell M.D.   On: 10/21/2015 00:11   I have personally reviewed and evaluated these images and lab results as part of my medical decision-making.   EKG Interpretation   Date/Time:  Tuesday October 20 2015 23:15:23 EST Ventricular Rate:  152 PR Interval:    QRS Duration: 89 QT Interval:  285 QTC Calculation: 453 R Axis:   22 Text Interpretation:  Atrial fibrillation Abnormal R-wave progression,  early transition Repol abnrm suggests ischemia, diffuse leads ED PHYSICIAN  INTERPRETATION AVAILABLE IN [facility] Confirmed by TEST, Record  ([id number]) on 10/21/2015 8:04:26 AM      MDM   Final diagnoses:  None  Patient presents to the ED for a fib with RVR.  She was given dilt 86m by EMS, but now is still tachy to 190.  Will place on dilt gtt and admit for further care.  Troponin is  high at 0.9.  Her last admission it was also high and it was determined that it was due to demand ischemia.  She is currently still having active chest pain however.  Will admit to cardiology for care.    I personally performed the services described in this documentation, which was scribed in my presence. The recorded information has been reviewed and is accurate.     AEverlene Balls MD 10/21/15 2119

## 2015-10-21 ENCOUNTER — Ambulatory Visit (HOSPITAL_COMMUNITY): Payer: Medicaid Other

## 2015-10-21 ENCOUNTER — Encounter (HOSPITAL_COMMUNITY): Payer: Medicare Other

## 2015-10-21 DIAGNOSIS — I255 Ischemic cardiomyopathy: Secondary | ICD-10-CM | POA: Diagnosis present

## 2015-10-21 DIAGNOSIS — E1169 Type 2 diabetes mellitus with other specified complication: Secondary | ICD-10-CM | POA: Diagnosis not present

## 2015-10-21 DIAGNOSIS — Z794 Long term (current) use of insulin: Secondary | ICD-10-CM | POA: Diagnosis not present

## 2015-10-21 DIAGNOSIS — I5042 Chronic combined systolic (congestive) and diastolic (congestive) heart failure: Secondary | ICD-10-CM | POA: Diagnosis present

## 2015-10-21 DIAGNOSIS — R002 Palpitations: Secondary | ICD-10-CM | POA: Diagnosis present

## 2015-10-21 DIAGNOSIS — J449 Chronic obstructive pulmonary disease, unspecified: Secondary | ICD-10-CM | POA: Diagnosis present

## 2015-10-21 DIAGNOSIS — Z79899 Other long term (current) drug therapy: Secondary | ICD-10-CM | POA: Diagnosis not present

## 2015-10-21 DIAGNOSIS — E1159 Type 2 diabetes mellitus with other circulatory complications: Secondary | ICD-10-CM | POA: Diagnosis not present

## 2015-10-21 DIAGNOSIS — I5032 Chronic diastolic (congestive) heart failure: Secondary | ICD-10-CM

## 2015-10-21 DIAGNOSIS — E039 Hypothyroidism, unspecified: Secondary | ICD-10-CM | POA: Diagnosis present

## 2015-10-21 DIAGNOSIS — E1100 Type 2 diabetes mellitus with hyperosmolarity without nonketotic hyperglycemic-hyperosmolar coma (NKHHC): Secondary | ICD-10-CM | POA: Diagnosis not present

## 2015-10-21 DIAGNOSIS — J438 Other emphysema: Secondary | ICD-10-CM | POA: Diagnosis not present

## 2015-10-21 DIAGNOSIS — E1142 Type 2 diabetes mellitus with diabetic polyneuropathy: Secondary | ICD-10-CM | POA: Diagnosis present

## 2015-10-21 DIAGNOSIS — I480 Paroxysmal atrial fibrillation: Secondary | ICD-10-CM | POA: Diagnosis present

## 2015-10-21 DIAGNOSIS — Z7902 Long term (current) use of antithrombotics/antiplatelets: Secondary | ICD-10-CM | POA: Diagnosis not present

## 2015-10-21 DIAGNOSIS — E785 Hyperlipidemia, unspecified: Secondary | ICD-10-CM | POA: Diagnosis present

## 2015-10-21 DIAGNOSIS — F17200 Nicotine dependence, unspecified, uncomplicated: Secondary | ICD-10-CM | POA: Diagnosis present

## 2015-10-21 DIAGNOSIS — Z720 Tobacco use: Secondary | ICD-10-CM | POA: Diagnosis not present

## 2015-10-21 DIAGNOSIS — I2510 Atherosclerotic heart disease of native coronary artery without angina pectoris: Secondary | ICD-10-CM | POA: Diagnosis present

## 2015-10-21 DIAGNOSIS — K7030 Alcoholic cirrhosis of liver without ascites: Secondary | ICD-10-CM | POA: Diagnosis present

## 2015-10-21 DIAGNOSIS — I110 Hypertensive heart disease with heart failure: Secondary | ICD-10-CM | POA: Diagnosis present

## 2015-10-21 DIAGNOSIS — F1010 Alcohol abuse, uncomplicated: Secondary | ICD-10-CM | POA: Diagnosis present

## 2015-10-21 DIAGNOSIS — Z9114 Patient's other noncompliance with medication regimen: Secondary | ICD-10-CM | POA: Diagnosis present

## 2015-10-21 DIAGNOSIS — E1165 Type 2 diabetes mellitus with hyperglycemia: Secondary | ICD-10-CM | POA: Diagnosis present

## 2015-10-21 DIAGNOSIS — I4891 Unspecified atrial fibrillation: Secondary | ICD-10-CM | POA: Diagnosis not present

## 2015-10-21 DIAGNOSIS — Z7984 Long term (current) use of oral hypoglycemic drugs: Secondary | ICD-10-CM | POA: Diagnosis not present

## 2015-10-21 DIAGNOSIS — I739 Peripheral vascular disease, unspecified: Secondary | ICD-10-CM | POA: Diagnosis present

## 2015-10-21 DIAGNOSIS — I10 Essential (primary) hypertension: Secondary | ICD-10-CM | POA: Diagnosis not present

## 2015-10-21 DIAGNOSIS — E669 Obesity, unspecified: Secondary | ICD-10-CM | POA: Diagnosis present

## 2015-10-21 DIAGNOSIS — F419 Anxiety disorder, unspecified: Secondary | ICD-10-CM | POA: Diagnosis present

## 2015-10-21 DIAGNOSIS — I248 Other forms of acute ischemic heart disease: Secondary | ICD-10-CM | POA: Diagnosis present

## 2015-10-21 DIAGNOSIS — R7989 Other specified abnormal findings of blood chemistry: Secondary | ICD-10-CM | POA: Diagnosis not present

## 2015-10-21 DIAGNOSIS — Z6839 Body mass index (BMI) 39.0-39.9, adult: Secondary | ICD-10-CM | POA: Diagnosis not present

## 2015-10-21 DIAGNOSIS — K219 Gastro-esophageal reflux disease without esophagitis: Secondary | ICD-10-CM | POA: Diagnosis present

## 2015-10-21 DIAGNOSIS — I70209 Unspecified atherosclerosis of native arteries of extremities, unspecified extremity: Secondary | ICD-10-CM

## 2015-10-21 LAB — CBC
HCT: 30.9 % — ABNORMAL LOW (ref 36.0–46.0)
HEMOGLOBIN: 9.7 g/dL — AB (ref 12.0–15.0)
MCH: 28.1 pg (ref 26.0–34.0)
MCHC: 31.4 g/dL (ref 30.0–36.0)
MCV: 89.6 fL (ref 78.0–100.0)
PLATELETS: 296 10*3/uL (ref 150–400)
RBC: 3.45 MIL/uL — AB (ref 3.87–5.11)
RDW: 14 % (ref 11.5–15.5)
WBC: 10.4 10*3/uL (ref 4.0–10.5)

## 2015-10-21 LAB — GLUCOSE, RANDOM: Glucose, Bld: 568 mg/dL (ref 65–99)

## 2015-10-21 LAB — GLUCOSE, CAPILLARY
GLUCOSE-CAPILLARY: 426 mg/dL — AB (ref 65–99)
GLUCOSE-CAPILLARY: 436 mg/dL — AB (ref 65–99)
GLUCOSE-CAPILLARY: 431 mg/dL — AB (ref 65–99)
GLUCOSE-CAPILLARY: 271 mg/dL — AB (ref 65–99)
Glucose-Capillary: 545 mg/dL — ABNORMAL HIGH (ref 65–99)
Glucose-Capillary: 208 mg/dL — ABNORMAL HIGH (ref 65–99)

## 2015-10-21 LAB — BASIC METABOLIC PANEL
ANION GAP: 11 (ref 5–15)
ANION GAP: 11 (ref 5–15)
BUN: 33 mg/dL — ABNORMAL HIGH (ref 6–20)
BUN: 35 mg/dL — AB (ref 6–20)
CALCIUM: 9.8 mg/dL (ref 8.9–10.3)
CHLORIDE: 96 mmol/L — AB (ref 101–111)
CO2: 27 mmol/L (ref 22–32)
CO2: 27 mmol/L (ref 22–32)
CREATININE: 1.66 mg/dL — AB (ref 0.44–1.00)
Calcium: 9.8 mg/dL (ref 8.9–10.3)
Chloride: 94 mmol/L — ABNORMAL LOW (ref 101–111)
Creatinine, Ser: 1.37 mg/dL — ABNORMAL HIGH (ref 0.44–1.00)
GFR calc Af Amer: 50 mL/min — ABNORMAL LOW (ref >60)
GFR calc non Af Amer: 34 mL/min — ABNORMAL LOW (ref >60)
GFR calc non Af Amer: 43 mL/min — ABNORMAL LOW (ref >60)
GFR, EST AFRICAN AMERICAN: 40 mL/min — AB (ref >60)
Glucose, Bld: 441 mg/dL — ABNORMAL HIGH (ref 65–99)
Glucose, Bld: 459 mg/dL — ABNORMAL HIGH (ref 65–99)
POTASSIUM: 4.2 mmol/L (ref 3.5–5.1)
Potassium: 4.3 mmol/L (ref 3.5–5.1)
SODIUM: 132 mmol/L — AB (ref 135–145)
SODIUM: 134 mmol/L — AB (ref 135–145)

## 2015-10-21 LAB — MRSA PCR SCREENING: MRSA by PCR: NEGATIVE

## 2015-10-21 LAB — TROPONIN I
TROPONIN I: 0.19 ng/mL — AB (ref <0.031)
TROPONIN I: 0.46 ng/mL — AB (ref <0.031)
Troponin I: 0.37 ng/mL — ABNORMAL HIGH (ref <0.031)

## 2015-10-21 MED ORDER — INSULIN ASPART 100 UNIT/ML ~~LOC~~ SOLN: 0.0000 [IU] | Freq: Three times a day (TID) | SUBCUTANEOUS | Status: DC

## 2015-10-21 MED ORDER — PANTOPRAZOLE SODIUM 40 MG PO TBEC
40.0000 mg | DELAYED_RELEASE_TABLET | Freq: Every day | ORAL | Status: DC
  Administered 2015-10-21 – 2015-10-23 (×3): 40 mg via ORAL
  Filled 2015-10-21 (×3): qty 1

## 2015-10-21 MED ORDER — ACETAMINOPHEN 325 MG PO TABS
650.0000 mg | ORAL_TABLET | ORAL | Status: DC | PRN
  Administered 2015-10-21 – 2015-10-23 (×8): 650 mg via ORAL
  Filled 2015-10-21 (×8): qty 2

## 2015-10-21 MED ORDER — SODIUM CHLORIDE 0.9 % IV SOLN
INTRAVENOUS | Status: DC
Start: 2015-10-21 — End: 2015-10-22
  Administered 2015-10-21: 17:00:00 via INTRAVENOUS

## 2015-10-21 MED ORDER — LEVOTHYROXINE SODIUM 50 MCG PO TABS
50.0000 ug | ORAL_TABLET | Freq: Every day | ORAL | Status: DC
  Administered 2015-10-21 – 2015-10-23 (×3): 50 ug via ORAL
  Filled 2015-10-21 (×3): qty 1

## 2015-10-21 MED ORDER — INSULIN ASPART 100 UNIT/ML ~~LOC~~ SOLN: 20.0000 [IU] | Freq: Once | SUBCUTANEOUS | Status: DC

## 2015-10-21 MED ORDER — METOPROLOL TARTRATE 1 MG/ML IV SOLN
5.0000 mg | Freq: Once | INTRAVENOUS | Status: AC
  Administered 2015-10-21: 5 mg via INTRAVENOUS
  Filled 2015-10-21: qty 5

## 2015-10-21 MED ORDER — INSULIN GLARGINE 100 UNIT/ML ~~LOC~~ SOLN
70.0000 [IU] | Freq: Every morning | SUBCUTANEOUS | Status: DC
  Administered 2015-10-21: 70 [IU] via SUBCUTANEOUS
  Filled 2015-10-21: qty 0.7

## 2015-10-21 MED ORDER — GABAPENTIN (ONCE-DAILY) 300 MG PO TABS: 1.0000 | ORAL_TABLET | Freq: Four times a day (QID) | ORAL | Status: DC

## 2015-10-21 MED ORDER — METOPROLOL TARTRATE 1 MG/ML IV SOLN: 5.0000 mg | Freq: Four times a day (QID) | INTRAVENOUS | Status: AC | PRN

## 2015-10-21 MED ORDER — INSULIN ASPART 100 UNIT/ML ~~LOC~~ SOLN
20.0000 [IU] | Freq: Once | SUBCUTANEOUS | Status: AC
  Administered 2015-10-21: 20 [IU] via SUBCUTANEOUS

## 2015-10-21 MED ORDER — INSULIN REGULAR BOLUS VIA INFUSION
0.0000 [IU] | Freq: Three times a day (TID) | INTRAVENOUS | Status: DC
  Filled 2015-10-21: qty 10

## 2015-10-21 MED ORDER — DEXTROSE 50 % IV SOLN
25.0000 mL | INTRAVENOUS | Status: DC | PRN
  Filled 2015-10-21: qty 50

## 2015-10-21 MED ORDER — INSULIN ASPART 100 UNIT/ML ~~LOC~~ SOLN
15.0000 [IU] | Freq: Once | SUBCUTANEOUS | Status: AC
  Administered 2015-10-21: 15 [IU] via SUBCUTANEOUS

## 2015-10-21 MED ORDER — METFORMIN HCL 500 MG PO TABS
500.0000 mg | ORAL_TABLET | Freq: Two times a day (BID) | ORAL | Status: DC
  Administered 2015-10-21 (×2): 500 mg via ORAL
  Filled 2015-10-21 (×2): qty 1

## 2015-10-21 MED ORDER — DILTIAZEM HCL 30 MG PO TABS: 30.0000 mg | ORAL_TABLET | Freq: Four times a day (QID) | ORAL | Status: DC

## 2015-10-21 MED ORDER — SODIUM CHLORIDE 0.9 % IV SOLN
INTRAVENOUS | Status: DC
  Administered 2015-10-21: 2.1 [IU]/h via INTRAVENOUS
  Filled 2015-10-21: qty 2.5

## 2015-10-21 MED ORDER — INSULIN ASPART 100 UNIT/ML ~~LOC~~ SOLN: 0.0000 [IU] | Freq: Every day | SUBCUTANEOUS | Status: DC

## 2015-10-21 MED ORDER — DEXTROSE-NACL 5-0.45 % IV SOLN
INTRAVENOUS | Status: DC
  Administered 2015-10-21: 19:00:00 via INTRAVENOUS

## 2015-10-21 MED ORDER — SODIUM CHLORIDE 0.9 % IV SOLN
2.0000 g | Freq: Once | INTRAVENOUS | Status: AC
  Administered 2015-10-21: 2 g via INTRAVENOUS
  Filled 2015-10-21: qty 20

## 2015-10-21 MED ORDER — RIVAROXABAN 20 MG PO TABS
20.0000 mg | ORAL_TABLET | Freq: Every day | ORAL | Status: DC
  Administered 2015-10-21 – 2015-10-22 (×2): 20 mg via ORAL
  Filled 2015-10-21 (×2): qty 1

## 2015-10-21 MED ORDER — CARVEDILOL 25 MG PO TABS
37.5000 mg | ORAL_TABLET | Freq: Two times a day (BID) | ORAL | Status: DC
  Administered 2015-10-21 – 2015-10-23 (×5): 37.5 mg via ORAL
  Filled 2015-10-21 (×5): qty 1

## 2015-10-21 MED ORDER — CLOPIDOGREL BISULFATE 75 MG PO TABS
75.0000 mg | ORAL_TABLET | Freq: Every day | ORAL | Status: DC
  Administered 2015-10-21 – 2015-10-23 (×3): 75 mg via ORAL
  Filled 2015-10-21 (×3): qty 1

## 2015-10-21 MED ORDER — INSULIN ASPART 100 UNIT/ML ~~LOC~~ SOLN
15.0000 [IU] | Freq: Three times a day (TID) | SUBCUTANEOUS | Status: DC
  Administered 2015-10-21: 15 [IU] via SUBCUTANEOUS

## 2015-10-21 MED ORDER — METOPROLOL TARTRATE 1 MG/ML IV SOLN
5.0000 mg | Freq: Four times a day (QID) | INTRAVENOUS | Status: DC
  Administered 2015-10-21: 5 mg via INTRAVENOUS
  Filled 2015-10-21: qty 5

## 2015-10-21 MED ORDER — DILTIAZEM HCL ER COATED BEADS 120 MG PO TB24
120.0000 mg | ORAL_TABLET | Freq: Every day | ORAL | Status: DC
  Administered 2015-10-21 – 2015-10-23 (×3): 120 mg via ORAL
  Filled 2015-10-21 (×6): qty 1

## 2015-10-21 MED ORDER — ATORVASTATIN CALCIUM 40 MG PO TABS
40.0000 mg | ORAL_TABLET | Freq: Every day | ORAL | Status: DC
  Administered 2015-10-21 – 2015-10-23 (×3): 40 mg via ORAL
  Filled 2015-10-21 (×3): qty 1

## 2015-10-21 MED ORDER — PROMETHAZINE HCL 25 MG PO TABS: 12.5000 mg | ORAL_TABLET | Freq: Four times a day (QID) | ORAL | Status: DC | PRN

## 2015-10-21 MED ORDER — GABAPENTIN 300 MG PO CAPS
300.0000 mg | ORAL_CAPSULE | Freq: Four times a day (QID) | ORAL | Status: DC
  Administered 2015-10-21 – 2015-10-23 (×9): 300 mg via ORAL
  Filled 2015-10-21 (×8): qty 1

## 2015-10-21 MED ORDER — FUROSEMIDE 40 MG PO TABS
40.0000 mg | ORAL_TABLET | Freq: Two times a day (BID) | ORAL | Status: DC
  Administered 2015-10-21 – 2015-10-23 (×5): 40 mg via ORAL
  Filled 2015-10-21 (×5): qty 1

## 2015-10-21 MED ORDER — METOCLOPRAMIDE HCL 5 MG PO TABS: 5.0000 mg | ORAL_TABLET | Freq: Four times a day (QID) | ORAL | Status: DC | PRN

## 2015-10-21 MED ORDER — DILTIAZEM HCL 25 MG/5ML IV SOLN
20.0000 mg | Freq: Once | INTRAVENOUS | Status: AC
  Administered 2015-10-21: 20 mg via INTRAVENOUS

## 2015-10-21 NOTE — Progress Notes (Signed)
CBG 436 stat glucose by lab and MD will be called with results /protocol

## 2015-10-21 NOTE — H&P (Signed)
HPI: 54 yo woman with CHF, HTN, DM, PAD (vasculopath) and prior substance abuse who presents with CP, SOB and palpitations.  She reports she sat for dinner and felt sudden onset of palpitations.  There was also onset of chest pressure, left neck/shoulder ache and SOB.  This is similar to prior episodes.  EMS called and noted to be in AF RVR, given 30 mg IV dilt.  On arrival to ED she remained in AF RVR stable BP.  She received another 20 mg IV dilt with drop in BP to 11M systolic.  Dilt drip started and cardiology consult requested.  HR remains ~ 140 bpm on 5 mg/hr diltiazem.  She reports adherence to her medications, specifically the carvedilol and xarelto.     Review of Systems:     Cardiac Review of Systems: {Y] = yes '[ ]'  = no  Chest Pain [  x  ]  Resting SOB [  x ] Exertional SOB  [  ]  Orthopnea [  ]   Pedal Edema [   ]    Palpitations [x  ] Syncope  [  ]   Presyncope [   ]  General Review of Systems: [Y] = yes [  ]=no Constitional: recent weight change [  ]; anorexia [  ]; fatigue [  ]; nausea [  ]; night sweats [  ]; fever [  ]; or chills [  ];                                                                      Dental: poor dentition[  ];   Eye : blurred vision [  ]; diplopia [   ]; vision changes [  ];  Amaurosis fugax[  ]; Resp: cough [  ];  wheezing[  ];  hemoptysis[  ]; shortness of breath[  ]; paroxysmal nocturnal dyspnea[  ]; dyspnea on exertion[  ]; or orthopnea[  ];  GI:  gallstones[  ], vomiting[  ];  dysphagia[  ]; melena[  ];  hematochezia [  ]; heartburn[  ];   GU: kidney stones [  ]; hematuria[  ];   dysuria [  ];  nocturia[  ];               Skin: rash [  ], swelling[  ];, hair loss[  ];  peripheral edema[  ];  or itching[  ]; Musculosketetal: myalgias[  ];  joint swelling[  ];  joint erythema[  ];  joint pain[  ];  back pain[  ];  Heme/Lymph: bruising[  ];  bleeding[  ];  anemia[  ];  Neuro: TIA[  ];  headaches[  ];  stroke[  ];  vertigo[  ];  seizures[  ];    paresthesias[  ];  difficulty walking[  ];  Psych:depression[  ]; anxiety[  ];  Endocrine: diabetes[  ];  thyroid dysfunction[  ];  Other:  Past Medical History  Diagnosis Date  . Alcohol abuse   . Narcotic abuse   . Cocaine abuse   . Marijuana abuse   . Tobacco abuse   . Alcoholic cirrhosis ([place])   . Cardiomyopathy ([place])   . Obesity   . Chronic systolic CHF (congestive heart  failure) ([place])     a. Last echo 12/205: EF 40-45%, inferoapical/posterior HK, not technically sufficient to allow eval of LV diastolic function, normal LA in size..  . Hypothyroidism   . GERD (gastroesophageal reflux disease)   . PAF (paroxysmal atrial fibrillation) ([place])   . Critical lower limb ischemia   . Hypertension   . CAD (coronary artery disease)     a. cath 11/10/2014 small vessel CAD. Demand ischemia in the setting of rapid a-fib  . Anxiety   . Poorly controlled type 2 diabetes mellitus ([place])   . Hyperlipemia   . Peripheral arterial disease ([place])     a. 01/2015 Angio/PTA: LSFA 100 w/ recon @ adductor canal and 1 vessel runoff via AT, RSFA 99 (atherectomy/pta) - 1 vessel runoff via diff dzs peroneal.  . Carotid artery disease ([place])     a. 01/2015 Carotid Angio: RICA 161, LICA 09U. s/p L carotid endarterectomy 02/2015.  [name] COPD (chronic obstructive pulmonary disease) ([place])   . Diabetic peripheral neuropathy ([name])    No current facility-administered medications on file prior to encounter.   Current Outpatient Prescriptions on File Prior to Encounter  Medication Sig Dispense Refill  . acetaminophen-codeine (TYLENOL #3) 300-30 MG tablet Take 1 tablet by mouth every 4 (four) hours as needed for moderate pain.    [name] albuterol (PROVENTIL HFA;VENTOLIN HFA) 108 (90 BASE) MCG/ACT inhaler Inhale 2 puffs into the lungs every 6 (six) hours as needed for wheezing or shortness of breath. 1 each 3  . atorvastatin (LIPITOR) 40 MG tablet Take 1 tablet (40 mg total) by mouth daily. 30 tablet 2  . carvedilol (COREG)  12.5 MG tablet Take 3 tablets (37.5 mg total) by mouth 2 (two) times daily with a meal. 60 tablet 0  . clopidogrel (PLAVIX) 75 MG tablet Take 1 tablet (75 mg total) by mouth daily with breakfast. 30 tablet 6  . furosemide (LASIX) 40 MG tablet Take 1 tablet (40 mg total) by mouth 2 (two) times daily. 90 tablet 2  . Gabapentin, Once-Daily, (GRALISE) 300 MG TABS Take 1 tablet by mouth 4 (four) times daily.    [name] glipiZIDE (GLUCOTROL XL) 10 MG 24 hr tablet Take 2 tablets (20 mg total) by mouth daily with breakfast. 60 tablet 3  . insulin glargine (LANTUS) 100 UNIT/ML injection Inject 0.7 mLs (70 Units total) into the skin every morning. 10 mL 11  . levothyroxine (SYNTHROID, LEVOTHROID) 50 MCG tablet Take 1 tablet (50 mcg total) by mouth daily. 30 tablet 2  . lisinopril (PRINIVIL,ZESTRIL) 20 MG tablet Take 1 tablet (20 mg total) by mouth daily. 30 tablet 2  . metFORMIN (GLUCOPHAGE) 500 MG tablet Take 500 mg by mouth 2 (two) times daily with a meal.    . metoCLOPramide (REGLAN) 5 MG tablet Take 5 mg by mouth every 6 (six) hours as needed for nausea.    . nitroGLYCERIN (NITROSTAT) 0.4 MG SL tablet Place 1 tablet (0.4 mg total) under the tongue every 5 (five) minutes as needed for chest pain. 25 tablet 3  . pantoprazole (PROTONIX) 40 MG tablet Take 1 tablet (40 mg total) by mouth daily. 30 tablet 2  . promethazine (PHENERGAN) 12.5 MG tablet Take 12.5 mg by mouth every 6 (six) hours as needed for nausea or vomiting.    . rivaroxaban (XARELTO) 20 MG TABS tablet Take 1 tablet (20 mg total) by mouth daily with supper. 30 tablet 2  . albuterol (PROVENTIL) (2.5 MG/3ML) 0.083% nebulizer solution Take 3 mLs (2.5  mg total) by nebulization every 6 (six) hours as needed. For wheezing. (Patient not taking: Reported on 10/15/2015) 75 mL 2  . Blood Glucose Monitoring Suppl (ACCU-CHEK AVIVA PLUS) W/DEVICE KIT 1 Device by Does not apply route 4 (four) times daily -  before meals and at bedtime. 1 kit 0  . cyclobenzaprine  (FLEXERIL) 10 MG tablet Take 1 tablet (10 mg total) by mouth 3 (three) times daily as needed for muscle spasms. (Patient not taking: Reported on 10/15/2015) 30 tablet 0  . glucose blood (ACCU-CHEK AVIVA PLUS) test strip Use as instructed 100 each 12  . insulin aspart (NOVOLOG) 100 UNIT/ML FlexPen Sliding scale (Patient not taking: Reported on 10/15/2015) 15 mL 11  . Insulin Pen Needle (ULTICARE MICRO PEN NEEDLES) 32G X 4 MM MISC 1 Syringe by Does not apply route 4 (four) times daily - after meals and at bedtime. 1 each 11  . Lancets (ACCU-CHEK SOFT TOUCH) lancets Use as instructed 100 each 12  . traMADol (ULTRAM) 50 MG tablet Take 1 tablet (50 mg total) by mouth every 12 (twelve) hours as needed for moderate pain. (Patient not taking: Reported on 10/15/2015) 20 tablet 0     Allergies  Allergen Reactions  . Amiodarone Nausea And Vomiting    Social History   Social History  . Marital Status: Divorced    Spouse Name: N/A  . Number of Children: N/A  . Years of Education: N/A   Occupational History  . disabled    Social History Main Topics  . Smoking status: Current Some Day Smoker -- 0.00 packs/day for 40 years    Types: E-cigarettes    Last Attempt to Quit: 02/19/2015  . Smokeless tobacco: Never Used  . Alcohol Use: No  . Drug Use: No     Comment: 04/29/2015 "last drug use was ~ 09/08/2013"  . Sexual Activity: No   Other Topics Concern  . Not on file   Social History Narrative    Family History  Problem Relation Age of Onset  . Hypertension Mother   . Diabetes Mother   . Cancer Mother     breast, ovarian, colon  . Clotting disorder Mother   . Heart disease Mother   . Heart attack Mother   . Hypertension Father   . Heart disease Father   . Emphysema Sister     smoked    PHYSICAL EXAM: Filed Vitals:   10/21/15 0059 10/21/15 0100  BP:  112/78  Pulse: 66   Temp:    Resp: 22    General:  Appears older than stated age. No respiratory difficulty HEENT: normal Neck:  supple. no JVD. Carotids 2+ bilat; no bruits. No lymphadenopathy or thryomegaly appreciated. Cor: PMI nondisplaced. Regular rate & rhythm. No rubs, gallops or murmurs. Lungs: clear Abdomen: soft, nontender, nondistended. No hepatosplenomegaly. No bruits or masses. Good bowel sounds. Extremities: no cyanosis, clubbing, rash, edema.  Decreased pulses Neuro: alert & oriented x 3, cranial nerves grossly intact. moves all 4 extremities w/o difficulty. Affect pleasant.  ECG: AF RVR, diffuse ST-T abnormality  Results for orders placed or performed during the hospital encounter of 10/20/15 (from the past 24 hour(s))  Basic metabolic panel     Status: Abnormal   Collection Time: 10/20/15 11:31 PM  Result Value Ref Range   Sodium 136 135 - 145 mmol/L   Potassium 3.6 3.5 - 5.1 mmol/L   Chloride 97 (L) 101 - 111 mmol/L   CO2 27 22 - 32 mmol/L  Glucose, Bld 321 (H) 65 - 99 mg/dL   BUN 25 (H) 6 - 20 mg/dL   Creatinine, Ser 1.48 (H) 0.44 - 1.00 mg/dL   Calcium 9.6 8.9 - 10.3 mg/dL   GFR calc non Af Amer 40 (L) >60 mL/min   GFR calc Af Amer 46 (L) >60 mL/min   Anion gap 12 5 - 15  CBC     Status: Abnormal   Collection Time: 10/20/15 11:31 PM  Result Value Ref Range   WBC 11.9 (H) 4.0 - 10.5 K/uL   RBC 3.98 3.87 - 5.11 MIL/uL   Hemoglobin 11.3 (L) 12.0 - 15.0 g/dL   HCT 35.5 (L) 36.0 - 46.0 %   MCV 89.2 78.0 - 100.0 fL   MCH 28.4 26.0 - 34.0 pg   MCHC 31.8 30.0 - 36.0 g/dL   RDW 13.7 11.5 - 15.5 %   Platelets 329 150 - 400 K/uL  Magnesium     Status: Abnormal   Collection Time: 10/20/15 11:31 PM  Result Value Ref Range   Magnesium 1.5 (L) 1.7 - 2.4 mg/dL  APTT     Status: None   Collection Time: 10/20/15 11:31 PM  Result Value Ref Range   aPTT 35 24 - 37 seconds  I-stat troponin, ED     Status: Abnormal   Collection Time: 10/20/15 11:37 PM  Result Value Ref Range   Troponin i, poc 0.09 (HH) 0.00 - 0.08 ng/mL   Comment NOTIFIED PHYSICIAN    Comment 3           Dg Chest 2  View  10/21/2015  CLINICAL DATA:  Acute onset chest pain tonight. Atrial fibrillation with rapid ventricular response. Hypertension. EXAM: CHEST  2 VIEW COMPARISON:  10/15/2015 FINDINGS: The heart size and mediastinal contours are within normal limits. Both lungs are clear. The visualized skeletal structures are unremarkable. IMPRESSION: No active cardiopulmonary disease. Electronically Signed   By: Earle Gell M.D.   On: 10/21/2015 00:11     ASSESSMENT: 54 yo woman with CHF, HTN, DM, PAD (vasculopath) and prior substance abuse who presents with CP, SOB and palpitations found to be in AF RVR.  I suspect the majority of symptoms are precipitated by RVR.  She has known CAD with last cath 10/31/14 that explains her recent elevated troponin in setting of demand ischemia.    PLAN/DISCUSSION: Admit to step down given RVR with associated symptoms Cycle troponins, suspect increase in setting of demand ischemia dilt drip and prn metop boluses for rate control Continue home xarelto for stroke prevention as well as plavix, statin and BB for CAD Hold lisinopril to allow room with BP for rate controlling agents Continue antihyperglycemic + SSI with CBG ACHS

## 2015-10-21 NOTE — ED Notes (Signed)
Pt in NSR, EKG performed

## 2015-10-21 NOTE — Care Management Note (Signed)
Case Management Note  Patient Details  Name: [name] MRN: [id number] Date of Birth: Sep 23, 1961  Subjective/Objective:       Adm w at fib             Action/Plan: lives w sister. Pt has pcs aid thru medicaid. Pt has medicaid for meds. She has skat and medicaid transp. Sister has car .  Expected Discharge Date:                  Expected Discharge Plan:  Home/Self Care  In-House Referral:  Clinical Social Work  Discharge planning Services  CM Consult  Post Acute Care Choice:    Choice offered to:     DME Arranged:    DME Agency:     HH Arranged:  PCS/Personal Care Services [facility] Agency:     Status of Service:     Medicare Important Message Given:    Date Medicare IM Given:    Medicare IM give by:    Date Additional Medicare IM Given:    Additional Medicare Important Message give by:     If discussed at [place] of Stay Meetings, dates discussed:    Additional Comments: pt goes to dr sun on [address] for pcp.  Lacretia Leigh, RN 10/21/2015, 9:38 AM

## 2015-10-21 NOTE — Care Management Note (Signed)
Case Management Note  Patient Details  Name: [name] MRN: [id number] Date of Birth: [birth year]  Subjective/Objective:           Adm w at fib w rvr        Action/Plan: lives at home   Expected Discharge Date:                  Expected Discharge Plan:     In-House Referral:     Discharge planning Services     Post Acute Care Choice:    Choice offered to:     DME Arranged:    DME Agency:     HH Arranged:    [place] Agency:     Status of Service:     Medicare Important Message Given:    Date Medicare IM Given:    Medicare IM give by:    Date Additional Medicare IM Given:    Additional Medicare Important Message give by:     If discussed at [place] of Stay Meetings, dates discussed:    Additional Comments: ur review done  Lacretia Leigh, RN 10/21/2015, 8:25 AM

## 2015-10-21 NOTE — Progress Notes (Signed)
Per Cardiac Rehab, the patient will need hand written rx at time of discharge along with transportation need.   [name], [place]

## 2015-10-21 NOTE — Progress Notes (Signed)
Lab draw results called MD order to give 20 units now and to recheck in one hour

## 2015-10-21 NOTE — Progress Notes (Signed)
Blood glucose is poorly controlled. She has been non-compliant with home insulin regimen. She may need an insulin gtts to gain control over BG's. Will cancel discharge. Consult medicine for assistance in blood sugar management.  Pixie Casino, MD, [facility] Attending Cardiologist [place]

## 2015-10-21 NOTE — ED Notes (Signed)
HR 140s

## 2015-10-21 NOTE — Progress Notes (Signed)
Inpatient Diabetes Program Recommendations  AACE/ADA: New Consensus Statement on Inpatient Glycemic Control (2015)  Target Ranges:  Prepandial:   less than 140 mg/dL      Peak postprandial:   less than 180 mg/dL (1-2 hours)      Critically ill patients:  140 - 180 mg/dL   Review of Glycemic Control  Inpatient Diabetes Program Recommendations:  Insulin - IV drip/GlucoStabilizer: consider starting insulin gtt until glucose stabilized Thank you  Raoul Pitch BSN, RN,CDE Inpatient Diabetes Coordinator [phone] (team pager)

## 2015-10-21 NOTE — Progress Notes (Signed)
Patients CBG for 12 noon revealed 545 results MD called and repeat/Lab and call back ([phone]) patient alert and oriented voices no complaints at this time

## 2015-10-21 NOTE — ED Notes (Signed)
HR 190s, Oni MD notified - verbal order to bolus 20 MG diltiazem

## 2015-10-21 NOTE — ED Notes (Signed)
Cardiology MD at bedside.

## 2015-10-21 NOTE — ED Notes (Signed)
Pt beginning to complain of foot pain, neck pain, head pain, indigestion. Also stating her back hurts

## 2015-10-21 NOTE — Progress Notes (Signed)
Patients CBG from lab draw 441 called MD twice still waiting for return call  For further instructions  [phone]

## 2015-10-21 NOTE — Consult Note (Signed)
[facility] Medical Consultation  [name] LHT:[id number] DOB: 01-04-1961 DOA: 10/20/2015 PCP: Lynne Logan, MD   Requesting physician: Dr. [name], cardiology Date of consultation: 10/21/15 Reason for consultation: Elevated CBG  Impression/Recommendations Principal Problem:   Atrial fibrillation with RVR ([place]) Active Problems:   Alcohol abuse   PAF (paroxysmal atrial fibrillation) ([name])   Chronic diastolic CHF (congestive heart failure) ([name])   COPD (chronic obstructive pulmonary disease) ([place])   Uncontrolled type 2 diabetes mellitus ([name])   Non compliance w medication regimen    Hyperglycemia in the setting of type 2 diabetes Patient's log indicates that she normally runs between 125 and 400 at home. She takes metformin 500 mg 3 times a day and glipizide 20 mg daily in addition to her Lantus and NovoLog.  CBGs inpatient have remained in the 400-500 range. As is normal inpatient practice, she has not been receiving her oral medications inpatient. she is not acidotic. Her anion gap is 11. She has no obvious signs of infection. Chest x-ray is clear.  she has not had any recent steroids or contrast media.  We will give the patient an additional dose of NovoLog and feel that she can safely be discharged to home.   Recommend that she resume her oral medications and insulins as previously prescribed. Also recommend near-term follow-up with her primary care physician for tighter diabetic management.  Please contact me if I can be of assistance in the meanwhile. Thank you for this consultation.  Chief Complaint: elevated blood sugar  HPI:  The patient is a 54 year old female with paroxysmal atrial fibrillation, chronic diastolic CHF, COPD, and uncontrolled type 2 diabetes. She was admitted by the cardiology service on 12/6 for atrial fibrillation with RVR.  They placed her on diltiazem drip and she converted to normal sinus. She is being prepared for discharge but it was noted that  her CBG was significantly elevated-in the 400s. [facility] were consulted for hyperglycemia in the setting of type 2 diabetes.  Review of Systems:  Review of Systems  Constitutional: Negative.   HENT: Negative.   Eyes: Negative.   Respiratory: Negative.   Cardiovascular: Positive for leg swelling.  Gastrointestinal: Negative.   Genitourinary: Negative.   Musculoskeletal: Positive for myalgias and joint pain.  Skin: Negative.   Neurological: Negative.   Endo/Heme/Allergies: Negative.   Psychiatric/Behavioral: Negative.      Past Medical History  Diagnosis Date  . Alcohol abuse   . Narcotic abuse   . Cocaine abuse   . Marijuana abuse   . Tobacco abuse   . Alcoholic cirrhosis ([place])   . Cardiomyopathy ([place])   . Obesity   . Chronic systolic CHF (congestive heart failure) ([place])     a. Last echo 12/205: EF 40-45%, inferoapical/posterior HK, not technically sufficient to allow eval of LV diastolic function, normal LA in size..  . Hypothyroidism   . GERD (gastroesophageal reflux disease)   . PAF (paroxysmal atrial fibrillation) ([place])   . Critical lower limb ischemia   . Hypertension   . CAD (coronary artery disease)     a. cath 11/10/2014 small vessel CAD. Demand ischemia in the setting of rapid a-fib  . Anxiety   . Poorly controlled type 2 diabetes mellitus ([place])   . Hyperlipemia   . Peripheral arterial disease ([place])     a. 01/2015 Angio/PTA: LSFA 100 w/ recon @ adductor canal and 1 vessel runoff via AT, RSFA 99 (atherectomy/pta) - 1 vessel runoff via diff dzs peroneal.  . Carotid  artery disease ([place])     a. 01/2015 Carotid Angio: RICA 390, LICA 30S. s/p L carotid endarterectomy 02/2015.  [name] COPD (chronic obstructive pulmonary disease) ([place])   . Diabetic peripheral neuropathy [facility])    Past Surgical History  Procedure Laterality Date  . Cardioversion  ~ 02/2013    "twice"   . Lower extremity angiogram N/A 09/10/2013    Procedure: LOWER EXTREMITY ANGIOGRAM;  Surgeon:  [name], MD;  Location: [facility] CATH LAB;  Service: Cardiovascular;  Laterality: N/A;  . Left heart catheterization with coronary angiogram N/A 10/31/2014    Procedure: LEFT HEART CATHETERIZATION WITH CORONARY ANGIOGRAM;  Surgeon: Burnell Blanks, MD;  Location: [facility] CATH LAB;  Service: Cardiovascular;  Laterality: N/A;  . Peripheral athrectomy Right 01/15/2015    SFA/notes 01/15/2015  . Balloon angioplasty, artery Right 01/15/2015    SFA/notes 01/15/2015  . Cardiac catheterization    . Lower extremity angiogram N/A 01/15/2015    Procedure: LOWER EXTREMITY ANGIOGRAM;  Surgeon: [name], MD;  Location: [facility] CATH LAB;  Service: Cardiovascular;  Laterality: N/A;  . Carotid angiogram N/A 01/15/2015    Procedure: CAROTID ANGIOGRAM;  Surgeon: [name], MD;  Location: [facility] CATH LAB;  Service: Cardiovascular;  Laterality: N/A;  . Endarterectomy Left 02/19/2015    Procedure: LEFT CAROTID ENDARTERECTOMY ;  Surgeon: Serafina Mitchell, MD;  Location: [facility] OR;  Service: Vascular;  Laterality: Left;  . Dilation and curettage of uterus  1988   Social History:  reports that she has been smoking E-cigarettes.  She has been smoking about 0.00 packs per day for the past 40 years. She has never used smokeless tobacco. She reports that she does not drink alcohol or use illicit drugs.  Allergies  Allergen Reactions  . Amiodarone Nausea And Vomiting   Family History  Problem Relation Age of Onset  . Hypertension Mother   . Diabetes Mother   . Cancer Mother     breast, ovarian, colon  . Clotting disorder Mother   . Heart disease Mother   . Heart attack Mother   . Hypertension Father   . Heart disease Father   . Emphysema Sister     smoked    Prior to Admission medications   Medication Sig Start Date End Date Taking? Authorizing Provider  acetaminophen-codeine (TYLENOL #3) 300-30 MG tablet Take 1 tablet by mouth every 4 (four) hours as needed for moderate pain.   Yes Historical Provider, MD   albuterol (PROVENTIL HFA;VENTOLIN HFA) 108 (90 BASE) MCG/ACT inhaler Inhale 2 puffs into the lungs every 6 (six) hours as needed for wheezing or shortness of breath. 06/30/15  Yes Arnoldo Morale, MD  atorvastatin (LIPITOR) 40 MG tablet Take 1 tablet (40 mg total) by mouth daily. 06/30/15  Yes Arnoldo Morale, MD  carvedilol (COREG) 12.5 MG tablet Take 3 tablets (37.5 mg total) by mouth 2 (two) times daily with a meal. 09/07/15  Yes Mechele Claude, DO  clopidogrel (PLAVIX) 75 MG tablet Take 1 tablet (75 mg total) by mouth daily with breakfast. 06/30/15  Yes Arnoldo Morale, MD  furosemide (LASIX) 40 MG tablet Take 1 tablet (40 mg total) by mouth 2 (two) times daily. 09/07/15  Yes Penelope Coop Vann, DO  Gabapentin, Once-Daily, (GRALISE) 300 MG TABS Take 1 tablet by mouth 4 (four) times daily.   Yes Historical Provider, MD  glipiZIDE (GLUCOTROL XL) 10 MG 24 hr tablet Take 2 tablets (20 mg total) by mouth daily with breakfast. 06/30/15  Yes [name]  Amao, MD  insulin glargine (LANTUS) 100 UNIT/ML injection Inject 0.7 mLs (70 Units total) into the skin every morning. 09/07/15  Yes Mechele Claude, DO  levothyroxine (SYNTHROID, LEVOTHROID) 50 MCG tablet Take 1 tablet (50 mcg total) by mouth daily. 06/30/15  Yes Arnoldo Morale, MD  lisinopril (PRINIVIL,ZESTRIL) 20 MG tablet Take 1 tablet (20 mg total) by mouth daily. 06/30/15  Yes Arnoldo Morale, MD  metFORMIN (GLUCOPHAGE) 500 MG tablet Take 500 mg by mouth 2 (two) times daily with a meal.   Yes Historical Provider, MD  metoCLOPramide (REGLAN) 5 MG tablet Take 5 mg by mouth every 6 (six) hours as needed for nausea.   Yes Historical Provider, MD  nitroGLYCERIN (NITROSTAT) 0.4 MG SL tablet Place 1 tablet (0.4 mg total) under the tongue every 5 (five) minutes as needed for chest pain. 01/16/15  Yes Rogelia Mire, NP  pantoprazole (PROTONIX) 40 MG tablet Take 1 tablet (40 mg total) by mouth daily. 09/22/15 09/21/16 Yes Arnoldo Morale, MD  promethazine (PHENERGAN)  12.5 MG tablet Take 12.5 mg by mouth every 6 (six) hours as needed for nausea or vomiting.   Yes Historical Provider, MD  rivaroxaban (XARELTO) 20 MG TABS tablet Take 1 tablet (20 mg total) by mouth daily with supper. 06/30/15  Yes Arnoldo Morale, MD  albuterol (PROVENTIL) (2.5 MG/3ML) 0.083% nebulizer solution Take 3 mLs (2.5 mg total) by nebulization every 6 (six) hours as needed. For wheezing. Patient not taking: Reported on 10/15/2015 06/30/15   Arnoldo Morale, MD  Blood Glucose Monitoring Suppl (ACCU-CHEK AVIVA PLUS) W/DEVICE KIT 1 Device by Does not apply route 4 (four) times daily -  before meals and at bedtime. 07/21/15   Arnoldo Morale, MD  cyclobenzaprine (FLEXERIL) 10 MG tablet Take 1 tablet (10 mg total) by mouth 3 (three) times daily as needed for muscle spasms. Patient not taking: Reported on 10/15/2015 07/21/15   Arnoldo Morale, MD  glucose blood (ACCU-CHEK AVIVA PLUS) test strip Use as instructed 07/21/15   Arnoldo Morale, MD  insulin aspart (NOVOLOG) 100 UNIT/ML FlexPen Sliding scale Patient not taking: Reported on 10/15/2015 06/30/15   Arnoldo Morale, MD  Insulin Pen Needle (ULTICARE MICRO PEN NEEDLES) 32G X 4 MM MISC 1 Syringe by Does not apply route 4 (four) times daily - after meals and at bedtime. 06/30/15   Arnoldo Morale, MD  Lancets (ACCU-CHEK SOFT TOUCH) lancets Use as instructed 07/21/15   Arnoldo Morale, MD  traMADol (ULTRAM) 50 MG tablet Take 1 tablet (50 mg total) by mouth every 12 (twelve) hours as needed for moderate pain. Patient not taking: Reported on 10/15/2015 09/07/15   Mechele Claude, DO   Physical Exam: Blood pressure 104/63, pulse 64, temperature 97.4 F (36.3 C), temperature source Oral, resp. rate 18, height '5\' 4"'  (1.626 m), weight 102.1 kg (225 lb 1.4 oz), last menstrual period 11/27/2012, SpO2 98 %. Filed Vitals:   10/21/15 0745 10/21/15 1100  BP: 117/68 104/63  Pulse: 68 64  Temp: 97.7 F (36.5 C) 97.4 F (36.3 C)  Resp: 14 18     General:  Pleasant, overweight  Caucasian female sitting up in a recliner chair  Eyes: Pupils equal and round, sclera clear conjunctiva pink  ENT: Moist membranes, no exudates or erythema in his oropharynx  Neck: Supple, no lymphadenopathy  Cardiovascular: Sounds regular, no murmurs rubs or gallops.  Respiratory: Clear to auscultation, no wheezes crackles or rales  Abdomen: Soft, nontender, nondistended, positive bowel sounds  Skin: No rashes bruises or lesions  Musculoskeletal: 5 over 5 strength in each, no effusions  Psychiatric: Alert and oriented, calm and appropriate  Neurologic: CN II- 12 grossly intact, no focal neuro deficits.  Labs on Admission:  Basic Metabolic Panel:  Recent Labs Lab 10/15/15 0115 10/15/15 1355 10/16/15 0514 10/20/15 2331 10/21/15 0838 10/21/15 1226 10/21/15 1436  NA 136  --  136 136 132*  --  134*  K 3.8  --  4.1 3.6 4.3  --  4.2  CL 94*  --  94* 97* 94*  --  96*  CO2 31  --  '30 27 27  ' --  27  GLUCOSE 355*  --  184* 321* 441* 568* 459*  BUN 25*  --  31* 25* 33*  --  35*  CREATININE 1.12*  --  1.23* 1.48* 1.66*  --  1.37*  CALCIUM 9.2  --  8.9 9.6 9.8  --  9.8  MG  --  1.4*  --  1.5*  --   --   --    Liver Function Tests:  Recent Labs Lab 10/15/15 0115 10/16/15 0514  AST 63* 54*  ALT 99* 81*  ALKPHOS 277* 204*  BILITOT 0.4 0.4  PROT 7.1 6.4*  ALBUMIN 3.3* 2.8*   CBC:  Recent Labs Lab 10/15/15 0115 10/16/15 0514 10/17/15 0315 10/20/15 2331 10/21/15 0838  WBC 12.8* 11.9* 10.8* 11.9* 10.4  HGB 11.0* 9.5* 9.6* 11.3* 9.7*  HCT 35.3* 30.1* 31.2* 35.5* 30.9*  MCV 89.4 89.1 88.9 89.2 89.6  PLT 346 277 285 329 296   Cardiac Enzymes:  Recent Labs Lab 10/15/15 1355 10/15/15 1655 10/15/15 2340 10/21/15 0318 10/21/15 0838  TROPONINI 1.89* 2.28* 1.43* 0.19* 0.46*   CBG:  Recent Labs Lab 10/17/15 1118 10/21/15 0249 10/21/15 0754 10/21/15 1140 10/21/15 1448  GLUCAP 294* 426* 436* 545* 431*    Radiological Exams on Admission: Dg Chest 2  View  10/21/2015  CLINICAL DATA:  Acute onset chest pain tonight. Atrial fibrillation with rapid ventricular response. Hypertension. EXAM: CHEST  2 VIEW COMPARISON:  10/15/2015 FINDINGS: The heart size and mediastinal contours are within normal limits. Both lungs are clear. The visualized skeletal structures are unremarkable. IMPRESSION: No active cardiopulmonary disease. Electronically Signed   By: Earle Gell M.D.   On: 10/21/2015 00:11    EKG: Independently reviewed. Atrial Fibrillation.  Time spent: 50 min.  Karen Kitchens [facility] Pager [phone]   If 7PM-7AM, please contact night-coverage [email or web address] Password [email or web address] 10/21/2015, 4:00 PM

## 2015-10-21 NOTE — Progress Notes (Signed)
Cardiology Office Note   Date:  10/21/2015   ID:  [name], DOB 1960/12/18, MRN [id number]   Patient Care Team: Donald Prose, MD as PCP - General (Family Medicine)    No chief complaint on file.    History of Present Illness: [name] is a 54 y.o. female with a hx of    Studies/Reports Reviewed Today:  Carotid US 5/16 R ICA occluded; L CEA patent  Echo 12/15 - Procedure narrative: Transthoracic echocardiography. Technically difficult study with reduced echocardiographic windows. - Left ventricle: The cavity size was normal. Wall thickness was normal. Systolic function was mildly to moderately reduced. The estimated ejection fraction was in the range of 40% to 45%. There appears to be inferoapical and posterior hypokinesis. The study is not technically sufficient to allow evaluation of LV diastolic function. - Left atrium: The atrium was normal in size. Impressions:- Technically difficult study. Compared to the prior echo in 2014, the EF is reduced to around 45% with what appears to be inferoapical and posterior hypokinesis.  LHC 10/31/14 Left main: No obstructive disease.  Left Anterior Descending Artery: Large caliber vessel that courses to the apex. The mid vessel has mild diffuse plaque. The first diagonal branch is a small caliber vessel with ostial 30% stenosis. The second diagonal branch is a small to moderate caliber vessel with distal 50% stenosis.  Circumflex Artery: Large caliber vessel with 30% mid stenosis. The first obtuse marginal branch is a small caliber vessel (1.5 mm) with diffuse 80% stenosis in the proximal and mid segment. The second obtuse marginal branch is small in caliber with 40% mid stenosis. The left posterolateral branch has mild diffuse plaque.  Right Coronary Artery: Small caliber non-dominant vessel with mid 90% stenosis.  Left Ventricular Angiogram: LVEF=30-35% with severe hypokinesis fo the anterior wall and  apex, inferoapex.  Impression: 1. Rapid atrial fibrillation during case 2. Moderately reduced LV systolic function 3. Diffuse small vessel disease including the small non-dominant RCA and small caliber first obtuse marginal branch 4. Elevated troponin likely due to demand ischemia with underlying small vessel CAD and rapid atrial fib Recommendations: Medical management of CAD. As above, her elevated troponin is likely related to demand ischemia with small vessel CAD and rapid atrial fib. Rate is still poorly controlled on IV amiodarone and IV diltiazem. May need TEE guided cardioversion.   Myoview 10/14 Low risk stress nuclear study. No reversible ischemia. LV systolic function is mildly depressed. LV Ejection Fraction: 48%. LV Wall Motion: Mild global depression of LV systolic function without segmental wall motion abnormalities.   Past Medical History  Diagnosis Date  . Alcohol abuse   . Narcotic abuse   . Cocaine abuse   . Marijuana abuse   . Tobacco abuse   . Alcoholic cirrhosis ([place])   . Cardiomyopathy ([place])   . Obesity   . Chronic systolic CHF (congestive heart failure) ([place])     a. Last echo 12/205: EF 40-45%, inferoapical/posterior HK, not technically sufficient to allow eval of LV diastolic function, normal LA in size..  . Hypothyroidism   . GERD (gastroesophageal reflux disease)   . PAF (paroxysmal atrial fibrillation) ([place])   . Critical lower limb ischemia   . Hypertension   . CAD (coronary artery disease)     a. cath 11/10/2014 small vessel CAD. Demand ischemia in the setting of rapid a-fib  . Anxiety   . Poorly controlled type 2 diabetes mellitus ([place])   . Hyperlipemia   . Peripheral  arterial disease ([place])     a. 01/2015 Angio/PTA: LSFA 100 w/ recon @ adductor canal and 1 vessel runoff via AT, RSFA 99 (atherectomy/pta) - 1 vessel runoff via diff dzs peroneal.  . Carotid artery disease ([place])     a. 01/2015 Carotid Angio: RICA [id number], LICA 99991111. s/p L carotid  endarterectomy 02/2015.  [name] COPD (chronic obstructive pulmonary disease) ([place])   . Diabetic peripheral neuropathy [facility])     Past Surgical History  Procedure Laterality Date  . Cardioversion  ~ 02/2013    "twice"   . Lower extremity angiogram N/A 09/10/2013    Procedure: LOWER EXTREMITY ANGIOGRAM;  Surgeon: [name], MD;  Location: [facility] CATH LAB;  Service: Cardiovascular;  Laterality: N/A;  . Left heart catheterization with coronary angiogram N/A 10/31/2014    Procedure: LEFT HEART CATHETERIZATION WITH CORONARY ANGIOGRAM;  Surgeon: Burnell Blanks, MD;  Location: [facility] CATH LAB;  Service: Cardiovascular;  Laterality: N/A;  . Peripheral athrectomy Right 01/15/2015    SFA/notes 01/15/2015  . Balloon angioplasty, artery Right 01/15/2015    SFA/notes 01/15/2015  . Cardiac catheterization    . Lower extremity angiogram N/A 01/15/2015    Procedure: LOWER EXTREMITY ANGIOGRAM;  Surgeon: [name], MD;  Location: [facility] CATH LAB;  Service: Cardiovascular;  Laterality: N/A;  . Carotid angiogram N/A 01/15/2015    Procedure: CAROTID ANGIOGRAM;  Surgeon: [name], MD;  Location: [facility] CATH LAB;  Service: Cardiovascular;  Laterality: N/A;  . Endarterectomy Left 02/19/2015    Procedure: LEFT CAROTID ENDARTERECTOMY ;  Surgeon: Serafina Mitchell, MD;  Location: [facility] OR;  Service: Vascular;  Laterality: Left;  . Dilation and curettage of uterus  1988     No current facility-administered medications for this visit.   No current outpatient prescriptions on file.   Facility-Administered Medications Ordered in Other Visits  Medication Dose Route Frequency Provider Last Rate Last Dose  . 0.9 %  sodium chloride infusion   Intravenous Continuous Erma Heritage, [place]      . acetaminophen (TYLENOL) tablet 650 mg  650 mg Oral Q4H PRN Alphia Moh, MD   650 mg at 10/21/15 2006  . atorvastatin (LIPITOR) tablet 40 mg  40 mg Oral Daily Alphia Moh, MD   40 mg at 10/21/15 1005  . carvedilol (COREG) tablet  37.5 mg  37.5 mg Oral BID WC Alphia Moh, MD   37.5 mg at 10/21/15 1631  . clopidogrel (PLAVIX) tablet 75 mg  75 mg Oral Q breakfast Alphia Moh, MD   75 mg at 10/21/15 0807  . dextrose 5 %-0.45 % sodium chloride infusion   Intravenous Continuous Erma Heritage, [place]      . dextrose 50 % solution 25 mL  25 mL Intravenous PRN Erma Heritage, PA      . diltiazem (CARDIZEM LA) 24 hr tablet 120 mg  120 mg Oral Daily Pixie Casino, MD   120 mg at 10/21/15 1005  . furosemide (LASIX) tablet 40 mg  40 mg Oral BID Alphia Moh, MD   40 mg at 10/21/15 1631  . gabapentin (NEURONTIN) capsule 300 mg  300 mg Oral QID Herminio Commons, MD   300 mg at 10/21/15 2116  . insulin regular (NOVOLIN R,HUMULIN R) 250 Units in sodium chloride 0.9 % 250 mL (1 Units/mL) infusion   Intravenous Continuous Erma Heritage, PA   4.6 Units/hr at 10/21/15 2209  . insulin regular bolus via infusion 0-10 Units  0-10 Units Intravenous TID  WC Erma Heritage, [place]   0 Units at 10/21/15 1851  . levothyroxine (SYNTHROID, LEVOTHROID) tablet 50 mcg  50 mcg Oral QAC breakfast Alphia Moh, MD   50 mcg at 10/21/15 0807  . metoCLOPramide (REGLAN) tablet 5 mg  5 mg Oral Q6H PRN Alphia Moh, MD      . metoprolol (LOPRESSOR) injection 5 mg  5 mg Intravenous Q6H PRN Herminio Commons, MD      . pantoprazole (PROTONIX) EC tablet 40 mg  40 mg Oral Daily Alphia Moh, MD   40 mg at 10/21/15 1005  . promethazine (PHENERGAN) tablet 12.5 mg  12.5 mg Oral Q6H PRN Alphia Moh, MD      . rivaroxaban Alveda Reasons) tablet 20 mg  20 mg Oral Q supper Alphia Moh, MD   20 mg at 10/21/15 1631    Allergies:   Amiodarone    Social History:   Social History   Social History  . Marital Status: Divorced    Spouse Name: N/A  . Number of Children: N/A  . Years of Education: N/A   Occupational History  . disabled    Social History Main Topics  . Smoking status: Current Some Day Smoker -- 0.00 packs/day  for 40 years    Types: E-cigarettes    Last Attempt to Quit: 02/19/2015  . Smokeless tobacco: Never Used  . Alcohol Use: No  . Drug Use: No     Comment: 04/29/2015 "last drug use was ~ 09/08/2013"  . Sexual Activity: No   Other Topics Concern  . Not on file   Social History Narrative     Family History:   Family History  Problem Relation Age of Onset  . Hypertension Mother   . Diabetes Mother   . Cancer Mother     breast, ovarian, colon  . Clotting disorder Mother   . Heart disease Mother   . Heart attack Mother   . Hypertension Father   . Heart disease Father   . Emphysema Sister     smoked      ROS:   Please see the history of present illness.   ROS    PHYSICAL EXAM: VS:  LMP 11/27/2012    Wt Readings from Last 3 Encounters:  10/21/15 225 lb 1.4 oz (102.1 kg)  10/17/15 231 lb 11.2 oz (105.098 kg)  09/29/15 224 lb (101.606 kg)     GEN: Well nourished, well developed, in no acute distress HEENT: normal Neck: no JVD, no carotid bruits, no masses Cardiac:  Normal S1/S2, RRR; no murmur ,  no rubs or gallops, no edema  Respiratory:  clear to auscultation bilaterally, no wheezing, rhonchi or rales. GI: soft, nontender, nondistended, + BS MS: no deformity or atrophy Skin: warm and dry  Neuro:  CNs II-XII intact, Strength and sensation are intact Psych: Normal affect   EKG:  EKG is ordered today.  It demonstrates:      Recent Labs: 10/29/2014: Pro B Natriuretic peptide (BNP) 1070.0* 10/15/2015: B Natriuretic Peptide 246.5*; TSH 2.196 10/16/2015: ALT 81* 10/20/2015: Magnesium 1.5* 10/21/2015: BUN 35*; Creatinine, Ser 1.37*; Hemoglobin 9.7*; Platelets 296; Potassium 4.2; Sodium 134*    Lipid Panel    Component Value Date/Time   CHOL 160 10/16/2015 0514   TRIG 246* 10/16/2015 0514   HDL 35* 10/16/2015 0514   CHOLHDL 4.6 10/16/2015 0514   VLDL 49* 10/16/2015 0514   LDLCALC 76 10/16/2015 0514      ASSESSMENT AND PLAN:  Medication  Changes: Current medicines are reviewed at length with the patient today.  Concerns regarding medicines are as outlined above.  The following changes have been made:   Discontinued Medications   No medications on file   Modified Medications   No medications on file   New Prescriptions   No medications on file   Labs/ tests ordered today include:   No orders of the defined types were placed in this encounter.     Disposition:    FU with     Signed, Versie Starks, MHS 10/21/2015 10:38 PM    [facility] [place], [place], [place]  [address] Phone: [phone]; Fax: [phone]    This encounter was created in error - please disregard.

## 2015-10-21 NOTE — Progress Notes (Addendum)
Patient seen and briefly examined. Telemetry indicates she is back in sinus rhythm. She feels "much better". Denies chest pain or palpitations. Responded well to diltiazem. Will added diltiazem LA 120 mg daily to her regimen. Continue carvedilol and Xarelto for stroke prevention (CHADSVASC score of 5). Follow-up as planned with Richardson Dopp, PA-C in the office tomorrow.  Pixie Casino, MD, [facility] Attending Cardiologist [place]

## 2015-10-22 ENCOUNTER — Encounter: Payer: Medicare Other | Admitting: Physician Assistant

## 2015-10-22 ENCOUNTER — Ambulatory Visit (HOSPITAL_BASED_OUTPATIENT_CLINIC_OR_DEPARTMENT_OTHER): Payer: Medicare Other | Attending: Physician Assistant

## 2015-10-22 DIAGNOSIS — Z794 Long term (current) use of insulin: Secondary | ICD-10-CM

## 2015-10-22 DIAGNOSIS — E1100 Type 2 diabetes mellitus with hyperosmolarity without nonketotic hyperglycemic-hyperosmolar coma (NKHHC): Secondary | ICD-10-CM

## 2015-10-22 LAB — GLUCOSE, CAPILLARY
GLUCOSE-CAPILLARY: 194 mg/dL — AB (ref 65–99)
GLUCOSE-CAPILLARY: 185 mg/dL — AB (ref 65–99)
GLUCOSE-CAPILLARY: 148 mg/dL — AB (ref 65–99)
GLUCOSE-CAPILLARY: 136 mg/dL — AB (ref 65–99)
GLUCOSE-CAPILLARY: 153 mg/dL — AB (ref 65–99)
GLUCOSE-CAPILLARY: 174 mg/dL — AB (ref 65–99)
GLUCOSE-CAPILLARY: 148 mg/dL — AB (ref 65–99)
GLUCOSE-CAPILLARY: 253 mg/dL — AB (ref 65–99)
GLUCOSE-CAPILLARY: 246 mg/dL — AB (ref 65–99)
Glucose-Capillary: 138 mg/dL — ABNORMAL HIGH (ref 65–99)
Glucose-Capillary: 167 mg/dL — ABNORMAL HIGH (ref 65–99)
Glucose-Capillary: 174 mg/dL — ABNORMAL HIGH (ref 65–99)
Glucose-Capillary: 192 mg/dL — ABNORMAL HIGH (ref 65–99)
Glucose-Capillary: 162 mg/dL — ABNORMAL HIGH (ref 65–99)
Glucose-Capillary: 174 mg/dL — ABNORMAL HIGH (ref 65–99)
Glucose-Capillary: 177 mg/dL — ABNORMAL HIGH (ref 65–99)
Glucose-Capillary: 240 mg/dL — ABNORMAL HIGH (ref 65–99)
Glucose-Capillary: 236 mg/dL — ABNORMAL HIGH (ref 65–99)
Glucose-Capillary: 274 mg/dL — ABNORMAL HIGH (ref 65–99)
Glucose-Capillary: 232 mg/dL — ABNORMAL HIGH (ref 65–99)

## 2015-10-22 MED ORDER — INSULIN ASPART 100 UNIT/ML ~~LOC~~ SOLN
6.0000 [IU] | Freq: Three times a day (TID) | SUBCUTANEOUS | Status: DC
  Administered 2015-10-23 (×2): 6 [IU] via SUBCUTANEOUS

## 2015-10-22 MED ORDER — INSULIN ASPART 100 UNIT/ML ~~LOC~~ SOLN
0.0000 [IU] | Freq: Three times a day (TID) | SUBCUTANEOUS | Status: DC
  Administered 2015-10-22: 7 [IU] via SUBCUTANEOUS
  Administered 2015-10-22: 11 [IU] via SUBCUTANEOUS
  Administered 2015-10-23: 4 [IU] via SUBCUTANEOUS
  Administered 2015-10-23: 15 [IU] via SUBCUTANEOUS

## 2015-10-22 MED ORDER — INSULIN GLARGINE 100 UNIT/ML ~~LOC~~ SOLN
70.0000 [IU] | Freq: Every day | SUBCUTANEOUS | Status: DC
  Administered 2015-10-22: 70 [IU] via SUBCUTANEOUS
  Filled 2015-10-22 (×2): qty 0.7

## 2015-10-22 NOTE — Progress Notes (Signed)
@  3:00pm  Received on [address] from  [facility].  Alert & oriented.  Nos c/o pain.  B/P 135/67   Hr 72  NSR   R 18 O2 sat @ 98% room air.      OOB in chair.  Mervyn Skeeters, RN

## 2015-10-22 NOTE — Progress Notes (Addendum)
Medical Consultation Follow up Note        PATIENT DETAILS Name: TAQUESHA REDDITT Age: 54 y.o. Sex: female Date of Birth: Jul 07, 1961 Admit Date: 10/20/2015 Admitting Physician Alphia Moh, MD PCP:SUN,VYVYAN Y, MD  Subjective: No SOB  Assessment/Plan: Principal Problem: Uncontrolled DM: claims takes SSI with meals and 100 units of Lantus at bedtime at home-currently on Insulin gtt-give Lantus 70 units-and stop Insulin gtt/IVF in 2 hours. Depending on CBG's will start SSI or scheduled premeal novolog. Continue to hold oral hypoglycemics for now.Ok to transfer out of SDU, TRH will follow along  Rest per cards    Time spent 25 minutes-Greater than 50% of this time was spent in counseling, explanation of diagnosis, planning of further management, and coordination of care.  MEDICATIONS: Scheduled Meds: . atorvastatin  40 mg Oral Daily  . carvedilol  37.5 mg Oral BID WC  . clopidogrel  75 mg Oral Q breakfast  . diltiazem  120 mg Oral Daily  . furosemide  40 mg Oral BID  . gabapentin  300 mg Oral QID  . insulin aspart  0-20 Units Subcutaneous TID WC  . insulin glargine  70 Units Subcutaneous Daily  . insulin regular  0-10 Units Intravenous TID WC  . levothyroxine  50 mcg Oral QAC breakfast  . pantoprazole  40 mg Oral Daily  . rivaroxaban  20 mg Oral Q supper   Continuous Infusions: . sodium chloride 50 mL/hr at 10/21/15 1820  . dextrose 5 % and 0.45% NaCl 10 mL/hr at 10/21/15 2000  . insulin (NOVOLIN-R) infusion 2 Units/hr (10/22/15 0715)   PRN Meds:.acetaminophen, dextrose, metoCLOPramide, promethazine    PHYSICAL EXAM: Vital signs in last 24 hours: Filed Vitals:   10/22/15 0000 10/22/15 0321 10/22/15 0400 10/22/15 0735  BP: 111/61 112/67 122/66 133/62  Pulse: 65 68 66 71  Temp:  98.2 F (36.8 C)  98.1 F (36.7 C)  TempSrc:  Oral  Oral  Resp: 19 17 18 20   Height:      Weight:  103.874 kg (229 lb)    SpO2: 97% 91% 96% 98%    Weight change: -0.454 kg  (-1 lb) Filed Weights   10/20/15 2317 10/21/15 0250 10/22/15 0321  Weight: 104.327 kg (230 lb) 102.1 kg (225 lb 1.4 oz) 103.874 kg (229 lb)   Body mass index is 39.29 kg/(m^2).   Gen Exam: Awake and alert with clear speech.  Neck: Supple, No JVD.   Chest: B/L Clear.   CVS: S1 S2 Regular, no murmurs.  Abdomen: soft, BS +, non tender, non distended.  Extremities: no edema, lower extremities warm to touch. Neurologic: Non Focal.   Skin: No Rash.   Wounds: N/A.   Intake/Output from previous day:  Intake/Output Summary (Last 24 hours) at 10/22/15 0810 Last data filed at 10/22/15 0507  Gross per 24 hour  Intake    110 ml  Output      0 ml  Net    110 ml     LAB RESULTS: CBC  Recent Labs Lab 10/16/15 0514 10/17/15 0315 10/20/15 2331 10/21/15 0838  WBC 11.9* 10.8* 11.9* 10.4  HGB 9.5* 9.6* 11.3* 9.7*  HCT 30.1* 31.2* 35.5* 30.9*  PLT 277 285 329 296  MCV 89.1 88.9 89.2 89.6  MCH 28.1 27.4 28.4 28.1  MCHC 31.6 30.8 31.8 31.4  RDW 13.7 13.7 13.7 14.0    Chemistries   Recent Labs Lab 10/15/15 1355 10/16/15 0514 10/20/15 2331  10/21/15 0838 10/21/15 1226 10/21/15 1436  NA  --  136 136 132*  --  134*  K  --  4.1 3.6 4.3  --  4.2  CL  --  94* 97* 94*  --  96*  CO2  --  30 27 27   --  27  GLUCOSE  --  184* 321* 441* 568* 459*  BUN  --  31* 25* 33*  --  35*  CREATININE  --  1.23* 1.48* 1.66*  --  1.37*  CALCIUM  --  8.9 9.6 9.8  --  9.8  MG 1.4*  --  1.5*  --   --   --     CBG:  Recent Labs Lab 10/21/15 2203 10/21/15 2311 10/22/15 10/22/15 0102 10/22/15 0206  GLUCAP 174* 148* 138* 136* 167*    GFR Estimated Creatinine Clearance: 56.4 mL/min (by C-G formula based on Cr of 1.37).  Coagulation profile  Recent Labs Lab 10/16/15 0514  INR 1.14    Cardiac Enzymes  Recent Labs Lab 10/21/15 0318 10/21/15 0838 10/21/15 1436  TROPONINI 0.19* 0.46* 0.37*    Invalid input(s): POCBNP No results for input(s): DDIMER in the last 72 hours. No  results for input(s): HGBA1C in the last 72 hours. No results for input(s): CHOL, HDL, LDLCALC, TRIG, CHOLHDL, LDLDIRECT in the last 72 hours. No results for input(s): TSH, T4TOTAL, T3FREE, THYROIDAB in the last 72 hours.  Invalid input(s): FREET3 No results for input(s): VITAMINB12, FOLATE, FERRITIN, TIBC, IRON, RETICCTPCT in the last 72 hours. No results for input(s): LIPASE, AMYLASE in the last 72 hours.  Urine Studies No results for input(s): UHGB, CRYS in the last 72 hours.  Invalid input(s): UACOL, UAPR, USPG, UPH, UTP, UGL, UKET, UBIL, UNIT, UROB, ULEU, UEPI, UWBC, URBC, UBAC, CAST, UCOM, BILUA  MICROBIOLOGY: Recent Results (from the past 240 hour(s))  MRSA PCR Screening     Status: None   Collection Time: 10/21/15  2:45 AM  Result Value Ref Range Status   MRSA by PCR NEGATIVE NEGATIVE Final    Comment:        The GeneXpert MRSA Assay (FDA approved for NASAL specimens only), is one component of a comprehensive MRSA colonization surveillance program. It is not intended to diagnose MRSA infection nor to guide or monitor treatment for MRSA infections.     RADIOLOGY STUDIES/RESULTS: Dg Chest 2 View  10/21/2015  CLINICAL DATA:  Acute onset chest pain tonight. Atrial fibrillation with rapid ventricular response. Hypertension. EXAM: CHEST  2 VIEW COMPARISON:  10/15/2015 FINDINGS: The heart size and mediastinal contours are within normal limits. Both lungs are clear. The visualized skeletal structures are unremarkable. IMPRESSION: No active cardiopulmonary disease. Electronically Signed   By: Earle Gell M.D.   On: 10/21/2015 00:11   Dg Chest Portable 1 View  10/15/2015  CLINICAL DATA:  Chest pain and dyspnea, onset tonight. EXAM: PORTABLE CHEST 1 VIEW COMPARISON:  09/04/2015 FINDINGS: A single AP portable view of the chest demonstrates no focal airspace consolidation or alveolar edema. The lungs are grossly clear. There is no large effusion or pneumothorax. Cardiac and  mediastinal contours appear unremarkable. IMPRESSION: No active disease. Electronically Signed   By: Andreas Newport M.D.   On: 10/15/2015 01:55    Oren Binet, MD  [facility] Pager:[phone]  If 7PM-7AM, please contact night-coverage [email or web address] Password [email or web address] 10/22/2015, 8:10 AM   LOS: 1 day

## 2015-10-22 NOTE — Progress Notes (Signed)
DAILY PROGRESS NOTE  Subjective:  No further a-fib overnight. No chest pain. Blood glucose has been poorly controlled. Appreciate IM recommendations for stating lantus and probably novolog insulin.   Objective:  Temp:  [97.4 F (36.3 C)-98.2 F (36.8 C)] 98.1 F (36.7 C) (12/08 0735) Pulse Rate:  [64-72] 71 (12/08 0735) Resp:  [15-20] 20 (12/08 0735) BP: (104-141)/(59-67) 133/62 mmHg (12/08 0735) SpO2:  [91 %-98 %] 98 % (12/08 0735) Weight:  [229 lb (103.874 kg)] 229 lb (103.874 kg) (12/08 0321) Weight change: -1 lb (-0.454 kg)  Intake/Output from previous day: 12/07 0701 - 12/08 0700 In: 110 [I.V.:110] Out: -   Intake/Output from this shift: Total I/O In: 240 [P.O.:240] Out: -   Medications: Current Facility-Administered Medications  Medication Dose Route Frequency Provider Last Rate Last Dose  . 0.9 %  sodium chloride infusion   Intravenous Continuous Erma Heritage, [place] 50 mL/hr at 10/21/15 1820    . acetaminophen (TYLENOL) tablet 650 mg  650 mg Oral Q4H PRN Alphia Moh, MD   650 mg at 10/22/15 1011  . atorvastatin (LIPITOR) tablet 40 mg  40 mg Oral Daily Alphia Moh, MD   40 mg at 10/22/15 1011  . carvedilol (COREG) tablet 37.5 mg  37.5 mg Oral BID WC Alphia Moh, MD   37.5 mg at 10/22/15 0814  . clopidogrel (PLAVIX) tablet 75 mg  75 mg Oral Q breakfast Alphia Moh, MD   75 mg at 10/22/15 0813  . dextrose 5 %-0.45 % sodium chloride infusion   Intravenous Continuous Erma Heritage, [place] 10 mL/hr at 10/21/15 2000    . dextrose 50 % solution 25 mL  25 mL Intravenous PRN Erma Heritage, [place]      . diltiazem (CARDIZEM LA) 24 hr tablet 120 mg  120 mg Oral Daily Pixie Casino, MD   120 mg at 10/22/15 1011  . furosemide (LASIX) tablet 40 mg  40 mg Oral BID Alphia Moh, MD   40 mg at 10/22/15 0813  . gabapentin (NEURONTIN) capsule 300 mg  300 mg Oral QID Herminio Commons, MD   300 mg at 10/22/15 1011  . insulin aspart (novoLOG) injection  0-20 Units  0-20 Units Subcutaneous TID WC Shanker Kristeen Mans, MD      . insulin glargine (LANTUS) injection 70 Units  70 Units Subcutaneous Daily Jonetta Osgood, MD   70 Units at 10/22/15 1004  . insulin regular (NOVOLIN R,HUMULIN R) 250 Units in sodium chloride 0.9 % 250 mL (1 Units/mL) infusion   Intravenous Continuous Erma Heritage, PA 1.8 mL/hr at 10/22/15 0821 1.8 Units/hr at 10/22/15 0821  . insulin regular bolus via infusion 0-10 Units  0-10 Units Intravenous TID WC Erma Heritage, [place]   0 Units at 10/21/15 1851  . levothyroxine (SYNTHROID, LEVOTHROID) tablet 50 mcg  50 mcg Oral QAC breakfast Alphia Moh, MD   50 mcg at 10/22/15 0813  . metoCLOPramide (REGLAN) tablet 5 mg  5 mg Oral Q6H PRN Alphia Moh, MD      . pantoprazole (PROTONIX) EC tablet 40 mg  40 mg Oral Daily Alphia Moh, MD   40 mg at 10/22/15 1011  . promethazine (PHENERGAN) tablet 12.5 mg  12.5 mg Oral Q6H PRN Alphia Moh, MD      . rivaroxaban Alveda Reasons) tablet 20 mg  20 mg Oral Q supper Alphia Moh, MD   20 mg at 10/21/15 1631    Physical Exam: General appearance: alert and no  distress Lungs: clear to auscultation bilaterally Heart: regular rate and rhythm, S1, S2 normal, no murmur, click, rub or gallop Extremities: extremities normal, atraumatic, no cyanosis or edema  Lab Results: Results for orders placed or performed during the hospital encounter of 10/20/15 (from the past 48 hour(s))  Basic metabolic panel     Status: Abnormal   Collection Time: 10/20/15 11:31 PM  Result Value Ref Range   Sodium 136 135 - 145 mmol/L   Potassium 3.6 3.5 - 5.1 mmol/L   Chloride 97 (L) 101 - 111 mmol/L   CO2 27 22 - 32 mmol/L   Glucose, Bld 321 (H) 65 - 99 mg/dL   BUN 25 (H) 6 - 20 mg/dL   Creatinine, Ser 1.48 (H) 0.44 - 1.00 mg/dL   Calcium 9.6 8.9 - 10.3 mg/dL   GFR calc non Af Amer 40 (L) >60 mL/min   GFR calc Af Amer 46 (L) >60 mL/min    Comment: (NOTE) The eGFR has been calculated using  the CKD EPI equation. This calculation has not been validated in all clinical situations. eGFR's persistently <60 mL/min signify possible Chronic Kidney Disease.    Anion gap 12 5 - 15  CBC     Status: Abnormal   Collection Time: 10/20/15 11:31 PM  Result Value Ref Range   WBC 11.9 (H) 4.0 - 10.5 K/uL   RBC 3.98 3.87 - 5.11 MIL/uL   Hemoglobin 11.3 (L) 12.0 - 15.0 g/dL   HCT 35.5 (L) 36.0 - 46.0 %   MCV 89.2 78.0 - 100.0 fL   MCH 28.4 26.0 - 34.0 pg   MCHC 31.8 30.0 - 36.0 g/dL   RDW 13.7 11.5 - 15.5 %   Platelets 329 150 - 400 K/uL  Magnesium     Status: Abnormal   Collection Time: 10/20/15 11:31 PM  Result Value Ref Range   Magnesium 1.5 (L) 1.7 - 2.4 mg/dL  APTT     Status: None   Collection Time: 10/20/15 11:31 PM  Result Value Ref Range   aPTT 35 24 - 37 seconds  I-stat troponin, ED     Status: Abnormal   Collection Time: 10/20/15 11:37 PM  Result Value Ref Range   Troponin i, poc 0.09 (HH) 0.00 - 0.08 ng/mL   Comment NOTIFIED PHYSICIAN    Comment 3            Comment: Due to the release kinetics of cTnI, a negative result within the first hours of the onset of symptoms does not rule out myocardial infarction with certainty. If myocardial infarction is still suspected, repeat the test at appropriate intervals.   MRSA PCR Screening     Status: None   Collection Time: 10/21/15  2:45 AM  Result Value Ref Range   MRSA by PCR NEGATIVE NEGATIVE    Comment:        The GeneXpert MRSA Assay (FDA approved for NASAL specimens only), is one component of a comprehensive MRSA colonization surveillance program. It is not intended to diagnose MRSA infection nor to guide or monitor treatment for MRSA infections.   Glucose, capillary     Status: Abnormal   Collection Time: 10/21/15  2:49 AM  Result Value Ref Range   Glucose-Capillary 426 (H) 65 - 99 mg/dL   Comment 1 Capillary Specimen   Troponin I     Status: Abnormal   Collection Time: 10/21/15  3:18 AM  Result Value  Ref Range   Troponin I 0.19 (H) <0.031 ng/mL  Comment:        PERSISTENTLY INCREASED TROPONIN VALUES IN THE RANGE OF 0.04-0.49 ng/mL CAN BE SEEN IN:       -UNSTABLE ANGINA       -CONGESTIVE HEART FAILURE       -MYOCARDITIS       -CHEST TRAUMA       -ARRYHTHMIAS       -LATE PRESENTING MYOCARDIAL INFARCTION       -COPD   CLINICAL FOLLOW-UP RECOMMENDED.   Glucose, capillary     Status: Abnormal   Collection Time: 10/21/15  7:54 AM  Result Value Ref Range   Glucose-Capillary 436 (H) 65 - 99 mg/dL   Comment 1 Capillary Specimen   Troponin I     Status: Abnormal   Collection Time: 10/21/15  8:38 AM  Result Value Ref Range   Troponin I 0.46 (H) <0.031 ng/mL    Comment:        PERSISTENTLY INCREASED TROPONIN VALUES IN THE RANGE OF 0.04-0.49 ng/mL CAN BE SEEN IN:       -UNSTABLE ANGINA       -CONGESTIVE HEART FAILURE       -MYOCARDITIS       -CHEST TRAUMA       -ARRYHTHMIAS       -LATE PRESENTING MYOCARDIAL INFARCTION       -COPD   CLINICAL FOLLOW-UP RECOMMENDED.   Basic metabolic panel     Status: Abnormal   Collection Time: 10/21/15  8:38 AM  Result Value Ref Range   Sodium 132 (L) 135 - 145 mmol/L   Potassium 4.3 3.5 - 5.1 mmol/L   Chloride 94 (L) 101 - 111 mmol/L   CO2 27 22 - 32 mmol/L   Glucose, Bld 441 (H) 65 - 99 mg/dL   BUN 33 (H) 6 - 20 mg/dL   Creatinine, Ser 1.66 (H) 0.44 - 1.00 mg/dL   Calcium 9.8 8.9 - 10.3 mg/dL   GFR calc non Af Amer 34 (L) >60 mL/min   GFR calc Af Amer 40 (L) >60 mL/min    Comment: (NOTE) The eGFR has been calculated using the CKD EPI equation. This calculation has not been validated in all clinical situations. eGFR's persistently <60 mL/min signify possible Chronic Kidney Disease.    Anion gap 11 5 - 15  CBC     Status: Abnormal   Collection Time: 10/21/15  8:38 AM  Result Value Ref Range   WBC 10.4 4.0 - 10.5 K/uL   RBC 3.45 (L) 3.87 - 5.11 MIL/uL   Hemoglobin 9.7 (L) 12.0 - 15.0 g/dL   HCT 30.9 (L) 36.0 - 46.0 %   MCV 89.6  78.0 - 100.0 fL   MCH 28.1 26.0 - 34.0 pg   MCHC 31.4 30.0 - 36.0 g/dL   RDW 14.0 11.5 - 15.5 %   Platelets 296 150 - 400 K/uL  Glucose, capillary     Status: Abnormal   Collection Time: 10/21/15 11:40 AM  Result Value Ref Range   Glucose-Capillary 545 (H) 65 - 99 mg/dL   Comment 1 Capillary Specimen   Glucose, random     Status: Abnormal   Collection Time: 10/21/15 12:26 PM  Result Value Ref Range   Glucose, Bld 568 (HH) 65 - 99 mg/dL    Comment: CRITICAL RESULT CALLED TO, READ BACK BY AND VERIFIED WITH: H.STREET,RN 1331 10/21/15 CLARK,S   Troponin I     Status: Abnormal   Collection Time: 10/21/15  2:36 PM  Result  Value Ref Range   Troponin I 0.37 (H) <0.031 ng/mL    Comment:        PERSISTENTLY INCREASED TROPONIN VALUES IN THE RANGE OF 0.04-0.49 ng/mL CAN BE SEEN IN:       -UNSTABLE ANGINA       -CONGESTIVE HEART FAILURE       -MYOCARDITIS       -CHEST TRAUMA       -ARRYHTHMIAS       -LATE PRESENTING MYOCARDIAL INFARCTION       -COPD   CLINICAL FOLLOW-UP RECOMMENDED.   Basic metabolic panel     Status: Abnormal   Collection Time: 10/21/15  2:36 PM  Result Value Ref Range   Sodium 134 (L) 135 - 145 mmol/L   Potassium 4.2 3.5 - 5.1 mmol/L   Chloride 96 (L) 101 - 111 mmol/L   CO2 27 22 - 32 mmol/L   Glucose, Bld 459 (H) 65 - 99 mg/dL   BUN 35 (H) 6 - 20 mg/dL   Creatinine, Ser 1.37 (H) 0.44 - 1.00 mg/dL   Calcium 9.8 8.9 - 10.3 mg/dL   GFR calc non Af Amer 43 (L) >60 mL/min   GFR calc Af Amer 50 (L) >60 mL/min    Comment: (NOTE) The eGFR has been calculated using the CKD EPI equation. This calculation has not been validated in all clinical situations. eGFR's persistently <60 mL/min signify possible Chronic Kidney Disease.    Anion gap 11 5 - 15  Glucose, capillary     Status: Abnormal   Collection Time: 10/21/15  2:48 PM  Result Value Ref Range   Glucose-Capillary 431 (H) 65 - 99 mg/dL   Comment 1 Capillary Specimen   Glucose, capillary     Status: Abnormal     Collection Time: 10/21/15  4:29 PM  Result Value Ref Range   Glucose-Capillary 271 (H) 65 - 99 mg/dL   Comment 1 Capillary Specimen   Glucose, capillary     Status: Abnormal   Collection Time: 10/21/15  6:00 PM  Result Value Ref Range   Glucose-Capillary 194 (H) 65 - 99 mg/dL  Glucose, capillary     Status: Abnormal   Collection Time: 10/21/15  7:05 PM  Result Value Ref Range   Glucose-Capillary 192 (H) 65 - 99 mg/dL   Comment 1 Glucose Stabilizer   Glucose, capillary     Status: Abnormal   Collection Time: 10/21/15  8:02 PM  Result Value Ref Range   Glucose-Capillary 208 (H) 65 - 99 mg/dL   Comment 1 Capillary Specimen   Glucose, capillary     Status: Abnormal   Collection Time: 10/21/15  9:03 PM  Result Value Ref Range   Glucose-Capillary 185 (H) 65 - 99 mg/dL   Comment 1 Capillary Specimen   Glucose, capillary     Status: Abnormal   Collection Time: 10/21/15 10:03 PM  Result Value Ref Range   Glucose-Capillary 174 (H) 65 - 99 mg/dL   Comment 1 Capillary Specimen   Glucose, capillary     Status: Abnormal   Collection Time: 10/21/15 11:11 PM  Result Value Ref Range   Glucose-Capillary 148 (H) 65 - 99 mg/dL   Comment 1 Capillary Specimen   Glucose, capillary     Status: Abnormal   Collection Time: 10/22/15 12:00 AM  Result Value Ref Range   Glucose-Capillary 138 (H) 65 - 99 mg/dL  Glucose, capillary     Status: Abnormal   Collection Time: 10/22/15  1:02 AM  Result Value Ref Range   Glucose-Capillary 136 (H) 65 - 99 mg/dL   Comment 1 Capillary Specimen   Glucose, capillary     Status: Abnormal   Collection Time: 10/22/15  2:06 AM  Result Value Ref Range   Glucose-Capillary 167 (H) 65 - 99 mg/dL    Imaging: Dg Chest 2 View  10/21/2015  CLINICAL DATA:  Acute onset chest pain tonight. Atrial fibrillation with rapid ventricular response. Hypertension. EXAM: CHEST  2 VIEW COMPARISON:  10/15/2015 FINDINGS: The heart size and mediastinal contours are within normal limits.  Both lungs are clear. The visualized skeletal structures are unremarkable. IMPRESSION: No active cardiopulmonary disease. Electronically Signed   By: Earle Gell M.D.   On: 10/21/2015 00:11    Assessment:  1. Principal Problem: 2.   Atrial fibrillation with RVR ([place]) 3. Active Problems: 4.   Alcohol abuse 5.   PAF (paroxysmal atrial fibrillation) ([place]) 6.   Chronic diastolic CHF (congestive heart failure) ([place]) 7.   COPD (chronic obstructive pulmonary disease) ([place]) 8.   Uncontrolled type 2 diabetes mellitus ([place]) 9.   Non compliance w medication regimen 10.   Plan:  1. HR and BP stable. Tolerating cardizem. No further a-fib. Working on BG control per IM -appreciate their assistance. Ok to transfer to telemetry floor today. Anticipate possible d/c tomorrow if BG better controlled on a stable regimen.   Time Spent Directly with Patient:  15 minutes  Length of Stay:  LOS: 1 day   Pixie Casino, MD, [facility] Attending Cardiologist [place] 10/22/2015, 10:48 AM

## 2015-10-22 NOTE — Plan of Care (Signed)
Problem: Food- and Nutrition-Related Knowledge Deficit (NB-1.1) Goal: Nutrition education Formal process to instruct or train a patient/client in a skill or to impart knowledge to help patients/clients voluntarily manage or modify food choices and eating behavior to maintain or improve health. Outcome: Adequate for Discharge  RD consulted for nutrition education regarding diabetes.     Lab Results  Component Value Date    HGBA1C 11.1* 10/15/2015   [name] is a 54 y.o. female with a Past Medical History of afib, ETOH abuse, PAF, CHF, COPD, DM. Consulted for hyperglycemia.   Pt with hx of poorly controlled DM; noted HGB A1c has historically ranged between 8.1-12.8.   Hx obtained from pt at bedside. She reports that she has received DM education before and confirms that she attended classes at [facility] approximately one year ago. She reports that she found these classes helpful.   Pt currently resides with her sister, who does the grocery shopping and meal preparation for their household. Pt has minimal resources to food access and relies on food banks to supplement food expenses. Protein sources are occasionally processed (hot dogs). Pt reports she consumes 3 meals per day (Breakfast: oatmeal/grits and eggs OR pancakes, Lunch: chicken, potatoes, and salad, Dinner: meat, starch, and vegetable). Beverages consist mainly of water, but pt reveals that she occasionally consumes diet soda.   Pt allowed this RD review her blood glucose log; she confirms that she checks her blood sugar 3 times daily pre-prandially. Readings are very inconsistent and range between 113-400, averaging in the 300 range.   Per chart review, pt home DM medication regimen is 20 mg Glipizide daily, 500 mg Metformin BID, and 70 units Insulin glargine every AM. She denies any difficulty obtaining or affording her medications and test strips, but reveals she occasionally has difficulty attending appointments due to transportation  issues.  The majority of the visit was spent on reinforcement on compliance of self-management behaviors to assist in optimizing glycemic control. Pt had a fair knowledge of DM diet and RD reinforced principles and answered pt questions.   Pt recently changed PCPs (she has been seeing Dr. [name] for approximately one month per her report). Spoke with cardiologist after visit and discussed findings; pt will likely d/c home within 1-2 days once blood sugar is stabilized.   RD provided "Carbohydrate Counting for People with Diabetes" handout from the Academy of [employer] Dietetics. Discussed different food groups and their effects on blood sugar, emphasizing carbohydrate-containing foods. Provided list of carbohydrates and recommended serving sizes of common foods.  Discussed importance of controlled and consistent carbohydrate intake throughout the day. Provided examples of ways to balance meals/snacks and encouraged intake of high-fiber, whole grain complex carbohydrates. Teach back method used.  Expect fair to good compliance.  Body mass index is 39.29 kg/(m^2). Pt meets criteria for obesity, class II based on current BMI.  Current diet order is Heart Healthy/ carb Modified, patient is consuming approximately 100% of meals at this time. Labs and medications reviewed. No further nutrition interventions warranted at this time. RD contact information provided. If additional nutrition issues arise, please re-consult RD.  [name], RD, LDN, CDE Pager: [phone] After hours Pager: [phone]

## 2015-10-23 ENCOUNTER — Encounter (HOSPITAL_COMMUNITY): Payer: Medicare Other

## 2015-10-23 ENCOUNTER — Telehealth: Payer: Self-pay | Admitting: Cardiology

## 2015-10-23 ENCOUNTER — Ambulatory Visit (HOSPITAL_COMMUNITY): Payer: Medicaid Other

## 2015-10-23 ENCOUNTER — Other Ambulatory Visit: Payer: Self-pay | Admitting: Physician Assistant

## 2015-10-23 DIAGNOSIS — Z720 Tobacco use: Secondary | ICD-10-CM

## 2015-10-23 DIAGNOSIS — K7030 Alcoholic cirrhosis of liver without ascites: Secondary | ICD-10-CM

## 2015-10-23 LAB — GLUCOSE, CAPILLARY
Glucose-Capillary: 186 mg/dL — ABNORMAL HIGH (ref 65–99)
Glucose-Capillary: 345 mg/dL — ABNORMAL HIGH (ref 65–99)

## 2015-10-23 MED ORDER — DILTIAZEM HCL ER COATED BEADS 120 MG PO TB24: 120.0000 mg | ORAL_TABLET | Freq: Every day | ORAL | Status: DC

## 2015-10-23 MED ORDER — DILTIAZEM HCL ER COATED BEADS 180 MG PO CP24: 180.0000 mg | ORAL_CAPSULE | Freq: Every day | ORAL | Status: DC

## 2015-10-23 MED ORDER — INSULIN GLARGINE 100 UNIT/ML ~~LOC~~ SOLN
80.0000 [IU] | Freq: Every day | SUBCUTANEOUS | Status: DC
  Administered 2015-10-23: 80 [IU] via SUBCUTANEOUS
  Filled 2015-10-23: qty 0.8

## 2015-10-23 MED ORDER — INSULIN GLARGINE 100 UNIT/ML ~~LOC~~ SOLN: 80.0000 [IU] | Freq: Every morning | SUBCUTANEOUS | Status: DC

## 2015-10-23 NOTE — Telephone Encounter (Signed)
New problem   Pt has TCM  On 12.16.16 w/Brittany per [facility] calling.

## 2015-10-23 NOTE — Progress Notes (Signed)
Medical Consultation Follow up Note        PATIENT DETAILS Name: [name] Age: 54 y.o. Sex: female Date of Birth: Jan 18, 1961 Admit Date: 10/20/2015 Admitting Physician Alphia Moh, MD PCP:SUN,VYVYAN Y, MD  Subjective: Sitting comfortably at bedside  Assessment/Plan: Principal Problem: Uncontrolled DM: CBG's much better off Insulin gtt-ok to d/c today-Would keep on lantus 80 units with meals, patient has a Novolog sliding scale at home which she can continue to use. I have asked her to follow up with a PCP in 1 week-with her CBG diary for further optimization of her insulin regimen.   Rest per cards  I will sign off.    Time spent 25 minutes-Greater than 50% of this time was spent in counseling, explanation of diagnosis, planning of further management, and coordination of care.  MEDICATIONS: Scheduled Meds: . atorvastatin  40 mg Oral Daily  . carvedilol  37.5 mg Oral BID WC  . clopidogrel  75 mg Oral Q breakfast  . diltiazem  120 mg Oral Daily  . furosemide  40 mg Oral BID  . gabapentin  300 mg Oral QID  . insulin aspart  0-20 Units Subcutaneous TID WC  . insulin aspart  6 Units Subcutaneous TID WC  . insulin glargine  80 Units Subcutaneous Daily  . levothyroxine  50 mcg Oral QAC breakfast  . pantoprazole  40 mg Oral Daily  . rivaroxaban  20 mg Oral Q supper   Continuous Infusions:   PRN Meds:.acetaminophen, metoCLOPramide, promethazine    PHYSICAL EXAM: Vital signs in last 24 hours: Filed Vitals:   10/22/15 1252 10/22/15 1649 10/22/15 2126 10/23/15 0504  BP:  163/75 130/57 134/57  Pulse:  71 70 63  Temp: 97.5 F (36.4 C) 98.2 F (36.8 C) 98.3 F (36.8 C) 97.9 F (36.6 C)  TempSrc:  Oral Oral Oral  Resp:  18 16 18   Height:      Weight:    103.329 kg (227 lb 12.8 oz)  SpO2:  100% 97% 99%    Weight change: -0.544 kg (-1 lb 3.2 oz) Filed Weights   10/21/15 0250 10/22/15 0321 10/23/15 0504  Weight: 102.1 kg (225 lb 1.4 oz) 103.874 kg  (229 lb) 103.329 kg (227 lb 12.8 oz)   Body mass index is 39.08 kg/(m^2).   Gen Exam: Awake and alert with clear speech.  Neck: Supple, No JVD.   Chest: B/L Clear.   CVS: S1 S2 Regular, no murmurs.  Abdomen: soft, BS +, non tender, non distended.  Extremities: no edema, lower extremities warm to touch. Neurologic: Non Focal.   Skin: No Rash.   Wounds: N/A.   Intake/Output from previous day:  Intake/Output Summary (Last 24 hours) at 10/23/15 0810 Last data filed at 10/23/15 [id number]  Gross per 24 hour  Intake 778.31 ml  Output      0 ml  Net 778.31 ml     LAB RESULTS: CBC  Recent Labs Lab 10/17/15 0315 10/20/15 2331 10/21/15 0838  WBC 10.8* 11.9* 10.4  HGB 9.6* 11.3* 9.7*  HCT 31.2* 35.5* 30.9*  PLT 285 329 296  MCV 88.9 89.2 89.6  MCH 27.4 28.4 28.1  MCHC 30.8 31.8 31.4  RDW 13.7 13.7 14.0    Chemistries   Recent Labs Lab 10/20/15 2331 10/21/15 0838 10/21/15 1226 10/21/15 1436  NA 136 132*  --  134*  K 3.6 4.3  --  4.2  CL 97* 94*  --  96*  CO2 27 27  --  27  GLUCOSE 321* 441* 568* 459*  BUN 25* 33*  --  35*  CREATININE 1.48* 1.66*  --  1.37*  CALCIUM 9.6 9.8  --  9.8  MG 1.5*  --   --   --     CBG:  Recent Labs Lab 10/22/15 1217 10/22/15 1443 10/22/15 1554 10/22/15 2129 10/23/15 0625  GLUCAP 236* 246* 240* 232* 186*    GFR Estimated Creatinine Clearance: 56.2 mL/min (by C-G formula based on Cr of 1.37).  Coagulation profile No results for input(s): INR, PROTIME in the last 168 hours.  Cardiac Enzymes  Recent Labs Lab 10/21/15 0318 10/21/15 0838 10/21/15 1436  TROPONINI 0.19* 0.46* 0.37*    Invalid input(s): POCBNP No results for input(s): DDIMER in the last 72 hours. No results for input(s): HGBA1C in the last 72 hours. No results for input(s): CHOL, HDL, LDLCALC, TRIG, CHOLHDL, LDLDIRECT in the last 72 hours. No results for input(s): TSH, T4TOTAL, T3FREE, THYROIDAB in the last 72 hours.  Invalid input(s): FREET3 No results  for input(s): VITAMINB12, FOLATE, FERRITIN, TIBC, IRON, RETICCTPCT in the last 72 hours. No results for input(s): LIPASE, AMYLASE in the last 72 hours.  Urine Studies No results for input(s): UHGB, CRYS in the last 72 hours.  Invalid input(s): UACOL, UAPR, USPG, UPH, UTP, UGL, UKET, UBIL, UNIT, UROB, ULEU, UEPI, UWBC, URBC, UBAC, CAST, UCOM, BILUA  MICROBIOLOGY: Recent Results (from the past 240 hour(s))  MRSA PCR Screening     Status: None   Collection Time: 10/21/15  2:45 AM  Result Value Ref Range Status   MRSA by PCR NEGATIVE NEGATIVE Final    Comment:        The GeneXpert MRSA Assay (FDA approved for NASAL specimens only), is one component of a comprehensive MRSA colonization surveillance program. It is not intended to diagnose MRSA infection nor to guide or monitor treatment for MRSA infections.     RADIOLOGY STUDIES/RESULTS: Dg Chest 2 View  10/21/2015  CLINICAL DATA:  Acute onset chest pain tonight. Atrial fibrillation with rapid ventricular response. Hypertension. EXAM: CHEST  2 VIEW COMPARISON:  10/15/2015 FINDINGS: The heart size and mediastinal contours are within normal limits. Both lungs are clear. The visualized skeletal structures are unremarkable. IMPRESSION: No active cardiopulmonary disease. Electronically Signed   By: Earle Gell M.D.   On: 10/21/2015 00:11   Dg Chest Portable 1 View  10/15/2015  CLINICAL DATA:  Chest pain and dyspnea, onset tonight. EXAM: PORTABLE CHEST 1 VIEW COMPARISON:  09/04/2015 FINDINGS: A single AP portable view of the chest demonstrates no focal airspace consolidation or alveolar edema. The lungs are grossly clear. There is no large effusion or pneumothorax. Cardiac and mediastinal contours appear unremarkable. IMPRESSION: No active disease. Electronically Signed   By: Andreas Newport M.D.   On: 10/15/2015 01:55    Oren Binet, MD  [facility] Pager:[phone]  If 7PM-7AM, please contact  night-coverage [email or web address] Password [email or web address] 10/23/2015, 8:10 AM   LOS: 2 days

## 2015-10-23 NOTE — Telephone Encounter (Signed)
Called patient. Remains in hospital waiting for DC instructions Patient is working out appointment since she will not be able to come on 12/16; she will be out of town through Christmas. At this point she has no needs or questions for [place].  Given our number to call for any needs or questions

## 2015-10-23 NOTE — Discharge Summary (Addendum)
Discharge Summary   Patient ID: [name] MRN: [id number], DOB/AGE: 12-05-1960 54 y.o. Admit date: 10/20/2015 D/C date:     10/23/2015  Primary Cardiologist: Dr. Meda Coffee  Principal Problem:   Atrial fibrillation with RVR ([place]) Active Problems:   Demand ischemia ([place])   Alcohol abuse   Alcoholic cirrhosis ([place])   Hypothyroidism   PAF (paroxysmal atrial fibrillation) ([name])   Carotid arterial disease ([name])   Chronic diastolic CHF (congestive heart failure) ([name])   COPD (chronic obstructive pulmonary disease) ([place])   Uncontrolled type 2 diabetes mellitus ([place])   Non compliance w medication regimen   Tobacco abuse    Admission Dates: 10/20/15-10/23/15 Discharge Diagnosis: afib with RVR and uncontrolled blood glucose   HPI: [name] is a 54 y.o. female with a history of PAD, heavy tobacco abuse, polysubstance abuse, alcoholic cirrhosis, ischemic cardiomyopathy, chronic diastolic CHF, PAF, uncontrolled DM, HTN, DVT in right LE, and recent admission for afib w RVR/NSTEMI who presented to [facility] on 10/21/15 with palpitations and found to be in recurrent afib with RVR.  She underwent cardiac catheterization on 10/31/14 revealing diffuse small vessel disease including the small non-dominant RCA and small caliber first obtuse marginal branch and EF 30-35%. She was continued to be treated medically. Carotid Doppler showed high-grade bilateral ICA disease and lower extremity dopplers revealed restenosis within the right SFA with a ABI of 0.34. She underwent left carotid endarterectomy by Dr. Trula Slade on 02/19/15 which was uncomplicated. Lower extremity Doppler studies performed 01/23/15 revealed ABIs in the 0.6 range bilaterally with a patent right SFA.  She was recently admitted to [facility] because of atrial fibrillation with RVR (10/22-10/24/16). Discharged with Coreg. She then represented to [facility] ED with palpitations and found to be in recurrent afib with RVR in the setting of not  taking her coreg. She was admitted 10/15/15-10/17/15 for the same. She was restarted on coreg. She then presented again on 10/20/15 with recurrent afib with RVR but this time she had been complaint with all outpatient medications including Coreg.    Hospital Course  Paroxysmal a-fib with RVR -cardioverted spontaneously to SR on dilt gtt.  -- CHADSVASC score of 5. Continue home Xarelto  -- Continued on home Coreg 37.5 mg BID. Diltiazem LA 120 mg daily added to her regimen this admission. This was later increased to 153m due to a brief episode of atrial tach.  Uncontrolled DM- HgA1c 11.1. BG's have been uncontrolled. She had not been complaint with home insulin regimen. Started on an insulin gtt which was later discontinued and started on lantus 840mwith meals. She has a Novolog sliding scale at home which she can continue to use. She has been asked to follow up with a PCP in 1 week-with her CBG diary for further optimization of her insulin regimen. This appointment has been scheduled.   Elevated troponin- c/w demand ischemia. No further work up  Chronic combined systolic/diastolic CHF - LVEF 4085-02%she is euvolemic. Continue home meds.   HTN - controlled. Lisinopril held upon admission to make room for more AV nodal blocking agents. We will continue to hold this at discharge  PAD - complex PAD followed by Dr BeGwenlyn FoundContinue plavix  Tobacco abuse- she is resistant to cessation  Obesity- Body mass index is 39.75 kg/(m^2). Weight loss advised.    The patient has had an uncomplicated hospital course and is recovering well. She has been seen by Dr. HiDebara Pickettoday and deemed ready for discharge home. All  follow-up appointments have been scheduled. Smoking cessation was disscussed in length. Discharge medications are listed below.   Discharge Vitals: Blood pressure 134/57, pulse 63, temperature 97.9 F (36.6 C), temperature source Oral, resp. rate 18, height 5' 4" (1.626 m), weight 227 lb  12.8 oz (103.329 kg), last menstrual period 11/27/2012, SpO2 99 %.  Labs: Lab Results  Component Value Date   WBC 10.4 10/21/2015   HGB 9.7* 10/21/2015   HCT 30.9* 10/21/2015   MCV 89.6 10/21/2015   PLT 296 10/21/2015     Recent Labs Lab 10/21/15 1436  NA 134*  K 4.2  CL 96*  CO2 27  BUN 35*  CREATININE 1.37*  CALCIUM 9.8  GLUCOSE 459*    Recent Labs  10/21/15 0318 10/21/15 0838 10/21/15 1436  TROPONINI 0.19* 0.46* 0.37*   Lab Results  Component Value Date   CHOL 160 10/16/2015   HDL 35* 10/16/2015   LDLCALC 76 10/16/2015   TRIG 246* 10/16/2015     Diagnostic Studies/Procedures   Dg Chest 2 View  10/21/2015  CLINICAL DATA:  Acute onset chest pain tonight. Atrial fibrillation with rapid ventricular response. Hypertension. EXAM: CHEST  2 VIEW COMPARISON:  10/15/2015 FINDINGS: The heart size and mediastinal contours are within normal limits. Both lungs are clear. The visualized skeletal structures are unremarkable. IMPRESSION: No active cardiopulmonary disease. Electronically Signed   By: Earle Gell M.D.   On: 10/21/2015 00:11   Dg Chest Portable 1 View  10/15/2015  CLINICAL DATA:  Chest pain and dyspnea, onset tonight. EXAM: PORTABLE CHEST 1 VIEW COMPARISON:  09/04/2015 FINDINGS: A single AP portable view of the chest demonstrates no focal airspace consolidation or alveolar edema. The lungs are grossly clear. There is no large effusion or pneumothorax. Cardiac and mediastinal contours appear unremarkable. IMPRESSION: No active disease. Electronically Signed   By: Andreas Newport M.D.   On: 10/15/2015 01:55    Discharge Medications     Medication List    STOP taking these medications        lisinopril 20 MG tablet  Commonly known as:  PRINIVIL,ZESTRIL      TAKE these medications        ACCU-CHEK AVIVA PLUS W/DEVICE Kit  1 Device by Does not apply route 4 (four) times daily -  before meals and at bedtime.     accu-chek soft touch lancets  Use as  instructed     acetaminophen-codeine 300-30 MG tablet  Commonly known as:  TYLENOL #3  Take 1 tablet by mouth every 4 (four) hours as needed for moderate pain.     albuterol (2.5 MG/3ML) 0.083% nebulizer solution  Commonly known as:  PROVENTIL  Take 3 mLs (2.5 mg total) by nebulization every 6 (six) hours as needed. For wheezing.     albuterol 108 (90 BASE) MCG/ACT inhaler  Commonly known as:  PROVENTIL HFA;VENTOLIN HFA  Inhale 2 puffs into the lungs every 6 (six) hours as needed for wheezing or shortness of breath.     atorvastatin 40 MG tablet  Commonly known as:  LIPITOR  Take 1 tablet (40 mg total) by mouth daily.     carvedilol 12.5 MG tablet  Commonly known as:  COREG  Take 3 tablets (37.5 mg total) by mouth 2 (two) times daily with a meal.     clopidogrel 75 MG tablet  Commonly known as:  PLAVIX  Take 1 tablet (75 mg total) by mouth daily with breakfast.     cyclobenzaprine 10 MG tablet  Commonly known as:  FLEXERIL  Take 1 tablet (10 mg total) by mouth 3 (three) times daily as needed for muscle spasms.     diltiazem 180 MG 24 hr capsule  Commonly known as:  CARDIZEM CD  Take 1 capsule (180 mg total) by mouth daily.     furosemide 40 MG tablet  Commonly known as:  LASIX  Take 1 tablet (40 mg total) by mouth 2 (two) times daily.     glipiZIDE 10 MG 24 hr tablet  Commonly known as:  GLUCOTROL XL  Take 2 tablets (20 mg total) by mouth daily with breakfast.     glucose blood test strip  Commonly known as:  ACCU-CHEK AVIVA PLUS  Use as instructed     GRALISE 300 MG Tabs  Generic drug:  Gabapentin (Once-Daily)  Take 1 tablet by mouth 4 (four) times daily.     insulin aspart 100 UNIT/ML FlexPen  Commonly known as:  NOVOLOG  Sliding scale     insulin glargine 100 UNIT/ML injection  Commonly known as:  LANTUS  Inject 0.8 mLs (80 Units total) into the skin every morning.     Insulin Pen Needle 32G X 4 MM Misc  Commonly known as:  ULTICARE MICRO PEN NEEDLES  1  Syringe by Does not apply route 4 (four) times daily - after meals and at bedtime.     levothyroxine 50 MCG tablet  Commonly known as:  SYNTHROID, LEVOTHROID  Take 1 tablet (50 mcg total) by mouth daily.     metFORMIN 500 MG tablet  Commonly known as:  GLUCOPHAGE  Take 500 mg by mouth 2 (two) times daily with a meal.     metoCLOPramide 5 MG tablet  Commonly known as:  REGLAN  Take 5 mg by mouth every 6 (six) hours as needed for nausea.     nitroGLYCERIN 0.4 MG SL tablet  Commonly known as:  NITROSTAT  Place 1 tablet (0.4 mg total) under the tongue every 5 (five) minutes as needed for chest pain.     pantoprazole 40 MG tablet  Commonly known as:  PROTONIX  Take 1 tablet (40 mg total) by mouth daily.     promethazine 12.5 MG tablet  Commonly known as:  PHENERGAN  Take 12.5 mg by mouth every 6 (six) hours as needed for nausea or vomiting.     rivaroxaban 20 MG Tabs tablet  Commonly known as:  XARELTO  Take 1 tablet (20 mg total) by mouth daily with supper.     traMADol 50 MG tablet  Commonly known as:  ULTRAM  Take 1 tablet (50 mg total) by mouth every 12 (twelve) hours as needed for moderate pain.        Disposition   The patient will be discharged in stable condition to home.  Follow-up Information    Follow up with Sandi Mariscal, MD On 10/27/2015.   Specialty:  Internal Medicine   Why:  Follow-Up with your Primary Care Doctor on 10/27/2015 at 2:00PM.   Contact information:   [place] [address] [phone]       Follow up with Lyda Jester, PA-C On 10/30/2015.   Specialties:  Cardiology, Radiology   Why:  @ 9am   Contact information:   [place] [address] [phone]         Duration of Discharge Encounter: Greater than 30 minutes including physician and PA time.  Mable Fill R PA-C 10/23/2015, 12:17 PM  I reviewed telemetry- she had a brief run of PAT overnight. ?symptomatic. Does not  appear to be a-fib (regular). Will increase cardizem to 180 mg daily.  Pixie Casino, MD, [facility] Attending Cardiologist [place]

## 2015-10-23 NOTE — Progress Notes (Signed)
Patient Name: [name] Date of Encounter: 10/23/2015     Principal Problem:   Atrial fibrillation with RVR ([place]) Active Problems:   Alcohol abuse   PAF (paroxysmal atrial fibrillation) ([name])   Chronic diastolic CHF (congestive heart failure) ([name])   COPD (chronic obstructive pulmonary disease) ([place])   Uncontrolled type 2 diabetes mellitus ([name])   Non compliance w medication regimen    SUBJECTIVE  Feeling well ready to go home.   CURRENT MEDS . atorvastatin  40 mg Oral Daily  . carvedilol  37.5 mg Oral BID WC  . clopidogrel  75 mg Oral Q breakfast  . diltiazem  120 mg Oral Daily  . furosemide  40 mg Oral BID  . gabapentin  300 mg Oral QID  . insulin aspart  0-20 Units Subcutaneous TID WC  . insulin aspart  6 Units Subcutaneous TID WC  . insulin glargine  80 Units Subcutaneous Daily  . levothyroxine  50 mcg Oral QAC breakfast  . pantoprazole  40 mg Oral Daily  . rivaroxaban  20 mg Oral Q supper    OBJECTIVE  Filed Vitals:   10/22/15 1252 10/22/15 1649 10/22/15 2126 10/23/15 0504  BP:  163/75 130/57 134/57  Pulse:  71 70 63  Temp: 97.5 F (36.4 C) 98.2 F (36.8 C) 98.3 F (36.8 C) 97.9 F (36.6 C)  TempSrc:  Oral Oral Oral  Resp:  18 16 18   Height:      Weight:    227 lb 12.8 oz (103.329 kg)  SpO2:  100% 97% 99%    Intake/Output Summary (Last 24 hours) at 10/23/15 0808 Last data filed at 10/23/15 [id number]  Gross per 24 hour  Intake 778.31 ml  Output      0 ml  Net 778.31 ml   Filed Weights   10/21/15 0250 10/22/15 0321 10/23/15 0504  Weight: 225 lb 1.4 oz (102.1 kg) 229 lb (103.874 kg) 227 lb 12.8 oz (103.329 kg)    PHYSICAL EXAM  General: Pleasant, NAD. obese Neuro: Alert and oriented X 3. Moves all extremities spontaneously. Psych: Normal affect. HEENT:  Normal  Neck: Supple without bruits or JVD. Lungs:  Resp regular and unlabored, CTA. Heart: RRR no s3, s4, or murmurs. Abdomen: Soft, non-tender, non-distended, BS + x 4.  Extremities: No  clubbing, cyanosis or edema. DP/PT/Radials 2+ and equal bilaterally. Cool  Accessory Clinical Findings  CBC  Recent Labs  10/20/15 2331 10/21/15 0838  WBC 11.9* 10.4  HGB 11.3* 9.7*  HCT 35.5* 30.9*  MCV 89.2 89.6  PLT 329 0000000   Basic Metabolic Panel  Recent Labs  10/20/15 2331 10/21/15 0838 10/21/15 1226 10/21/15 1436  NA 136 132*  --  134*  K 3.6 4.3  --  4.2  CL 97* 94*  --  96*  CO2 27 27  --  27  GLUCOSE 321* 441* 568* 459*  BUN 25* 33*  --  35*  CREATININE 1.48* 1.66*  --  1.37*  CALCIUM 9.6 9.8  --  9.8  MG 1.5*  --   --   --     Cardiac Enzymes  Recent Labs  10/21/15 0318 10/21/15 0838 10/21/15 1436  TROPONINI 0.19* 0.46* 0.37*    TELE  NSR with few PVCs  Radiology/Studies  Dg Chest 2 View  10/21/2015  CLINICAL DATA:  Acute onset chest pain tonight. Atrial fibrillation with rapid ventricular response. Hypertension. EXAM: CHEST  2 VIEW COMPARISON:  10/15/2015 FINDINGS: The heart size and mediastinal  contours are within normal limits. Both lungs are clear. The visualized skeletal structures are unremarkable. IMPRESSION: No active cardiopulmonary disease. Electronically Signed   By: Earle Gell M.D.   On: 10/21/2015 00:11   Dg Chest Portable 1 View  10/15/2015  CLINICAL DATA:  Chest pain and dyspnea, onset tonight. EXAM: PORTABLE CHEST 1 VIEW COMPARISON:  09/04/2015 FINDINGS: A single AP portable view of the chest demonstrates no focal airspace consolidation or alveolar edema. The lungs are grossly clear. There is no large effusion or pneumothorax. Cardiac and mediastinal contours appear unremarkable. IMPRESSION: No active disease. Electronically Signed   By: Andreas Newport M.D.   On: 10/15/2015 01:55    ASSESSMENT AND PLAN  [name] is a 54 y.o. female with a history of h/o PAD, heavy tobacco abuse, polysubstance abuse, alcoholic cirrhosis, ischemic cardiomyopathy, PAF, uncontrolled DM, HTN, DVT in right LE, and recent admission for afib w  RVR/NSTEMI who presented to [facility] on 10/21/15 with palpitations and found to be in recurrent afib with RVR.  Paroxysmal a-fib with RVR -cardioverted spontaneously to SR on dilt gtt.  -- CHADSVASC score of 5.  Continue home Xarelto  -- Continued on home Coreg 37.5 mg BID.  Diltiazem LA 120 mg daily added to her regimen this admission.   Uncontrolled DM-  HgA1c 11.1. BG's have been uncontrolled. She had not been complaint with home insulin regimen. Started on an insulin gtt which was later discontinued and started on lantus 80mg  with meals. She has a Novolog sliding scale at home which she can continue to use. She has been asked to follow up with a PCP in 1 week-with her CBG diary for further optimization of her insulin regimen. This appointment has been scheduled.   Elevated troponin- c/w demand ischemia. No further work up  Chronic combined systolic/diastolic CHF - LVEF [id number], she is euvolemic. Continue home meds.   HTN - controlled.  PAD - complex PAD followed by Dr Gwenlyn Found.  Tobacco abuse- she is resistant to cessation  Obesity- Body mass index is 39.75 kg/(m^2). Weight loss advised.     Judy Pimple PA-C  Pager [phone]

## 2015-10-23 NOTE — Discharge Instructions (Signed)
Follow up with your Primary Care Physician (Dr. Sandi Mariscal) as [place] as possible for Diabetic control.    Resume your medications as previously prescribed:  Metformin, Glipizide, Novolog and Lantus. Please take 60units of lantus every night. Stop the Novolog  Please take 15 units of Novolog with meals. Please decrease or stop the novolog and lantus if your sugar gets near or below 100.   Atrial Fibrillation Atrial fibrillation is a type of heartbeat that is irregular or fast (rapid). If you have this condition, your heart keeps quivering in a weird (chaotic) way. This condition can make it so your heart cannot pump blood normally. Having this condition gives a person more risk for stroke, heart failure, and other heart problems. There are different types of atrial fibrillation. Talk with your doctor to learn about the type that you have. HOME CARE  Take over-the-counter and prescription medicines only as told by your doctor.  If your doctor prescribed a blood-thinning medicine, take it exactly as told. Taking too much of it can cause bleeding. If you do not take enough of it, you will not have the protection that you need against stroke and other problems.  Do not use any tobacco products. These include cigarettes, chewing tobacco, and e-cigarettes. If you need help quitting, ask your doctor.  If you have apnea (obstructive sleep apnea), manage it as told by your doctor.  Do not drink alcohol.  Do not drink beverages that have caffeine. These include coffee, soda, and tea.  Maintain a healthy weight. Do not use diet pills unless your doctor says they are safe for you. Diet pills may make heart problems worse.  Follow diet instructions as told by your doctor.  Exercise regularly as told by your doctor.  Keep all follow-up visits as told by your doctor. This is important. GET HELP IF:  You notice a change in the speed, rhythm, or strength of your heartbeat.  You are taking a  blood-thinning medicine and you notice more bruising.  You get tired more easily when you move or exercise. GET HELP RIGHT AWAY IF:  You have pain in your chest or your belly (abdomen).  You have sweating or weakness.  You feel sick to your stomach (nauseous).  You notice blood in your throw up (vomit), poop (stool), or pee (urine).  You are short of breath.  You suddenly have swollen feet and ankles.  You feel dizzy.  Your suddenly get weak or numb in your face, arms, or legs, especially if it happens on one side of your body.  You have trouble talking, trouble understanding, or both.  Your face or your eyelid droops on one side. These symptoms may be an emergency. Do not wait to see if the symptoms will go away. Get medical help right away. Call your local emergency services (911 in the [place] Do not drive yourself to the hospital.   This information is not intended to replace advice given to you by your health care provider. Make sure you discuss any questions you have with your health care provider.   Document Released: 08/09/2008 Document Revised: 07/22/2015 Document Reviewed: 02/25/2015 Elsevier Interactive Patient Education [employer].

## 2015-10-23 NOTE — Progress Notes (Signed)
D/C instructions reviewed including reviewing AVS print out and medications taken here/ medications still to take today at home, new Rx, insulins, follow-up appointments. Taken by volunteer via w/c with daughter and ex-husband to exit for d/c.

## 2015-10-23 NOTE — Progress Notes (Signed)
Phone call from [place], [place] - requesting updated information r/t pharmacy - have change Cardizem dose up to 180 mg and d/c 120 mg. Talked with patient - local pharmacy = [facility] - [address]/ [address] here in [place] - her other pharmacy is where she orders medications and are delivered to her monthly. Returned call to Valetta Fuller to provide updated pharmacy information - she stated she would call them r/t new Cardizem Rx.

## 2015-10-26 ENCOUNTER — Encounter (HOSPITAL_COMMUNITY): Payer: Medicare Other

## 2015-10-26 ENCOUNTER — Encounter: Payer: Medicare Other | Admitting: Nurse Practitioner

## 2015-10-26 ENCOUNTER — Ambulatory Visit (HOSPITAL_COMMUNITY): Payer: Medicaid Other

## 2015-10-28 ENCOUNTER — Ambulatory Visit (HOSPITAL_COMMUNITY): Payer: Medicaid Other

## 2015-10-28 ENCOUNTER — Encounter (HOSPITAL_COMMUNITY): Payer: Medicare Other

## 2015-10-30 ENCOUNTER — Encounter (HOSPITAL_COMMUNITY): Payer: Medicare Other

## 2015-10-30 ENCOUNTER — Encounter: Payer: Medicare Other | Admitting: Cardiology

## 2015-10-30 ENCOUNTER — Ambulatory Visit (HOSPITAL_COMMUNITY): Payer: Medicaid Other

## 2015-10-30 DIAGNOSIS — R0989 Other specified symptoms and signs involving the circulatory and respiratory systems: Secondary | ICD-10-CM

## 2015-11-02 ENCOUNTER — Encounter (HOSPITAL_COMMUNITY): Payer: Medicare Other

## 2015-11-02 ENCOUNTER — Ambulatory Visit (HOSPITAL_COMMUNITY): Payer: Medicaid Other

## 2015-11-02 IMAGING — CR DG CHEST 2V
2 series · 2 of 2 positions shown · non-contrast
Comparison: Portable chest x-ray of 11/02/2014

CLINICAL DATA: Preop, history of COPD and CHF

EXAM:
CHEST  2 VIEW

[w chest pa]
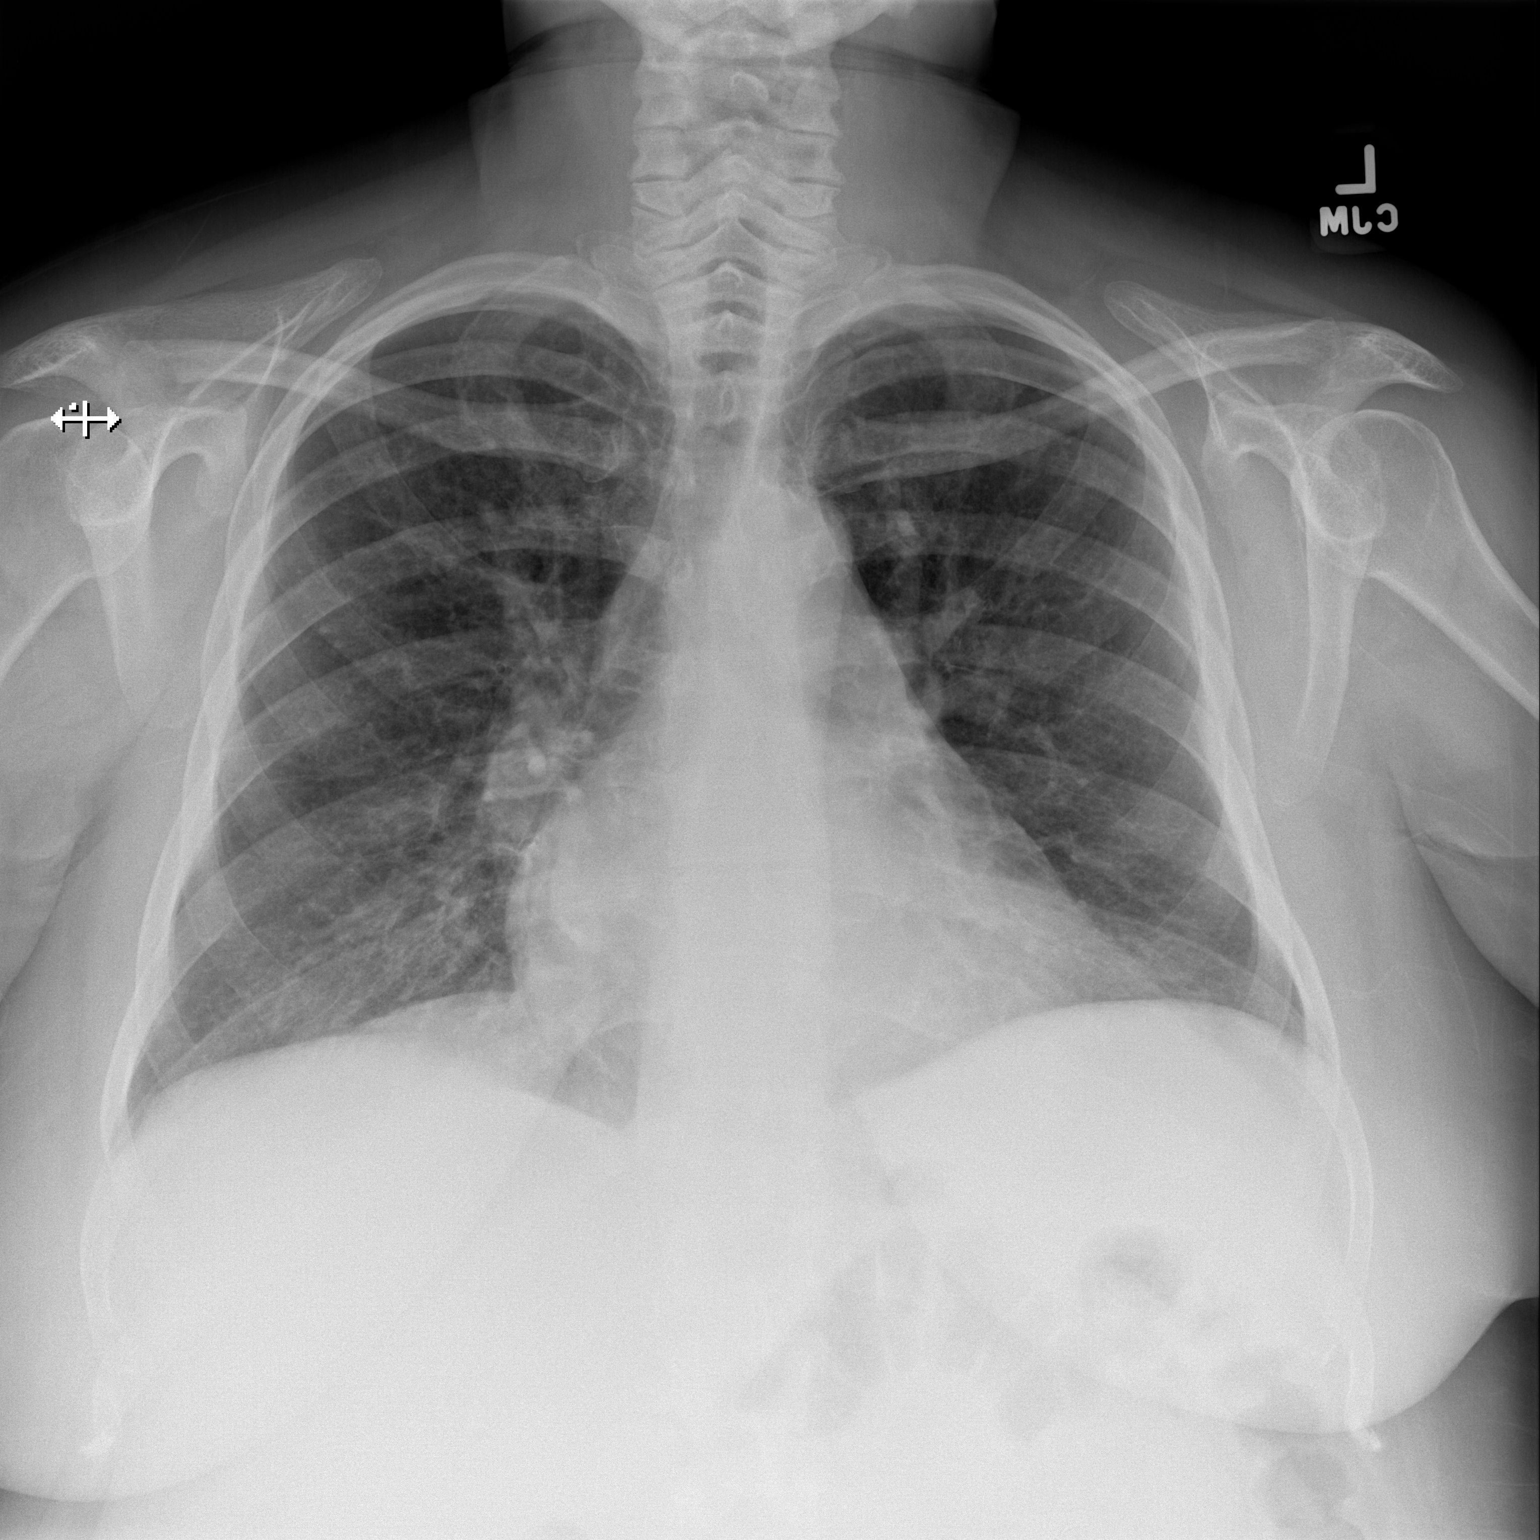

[w chest lat]
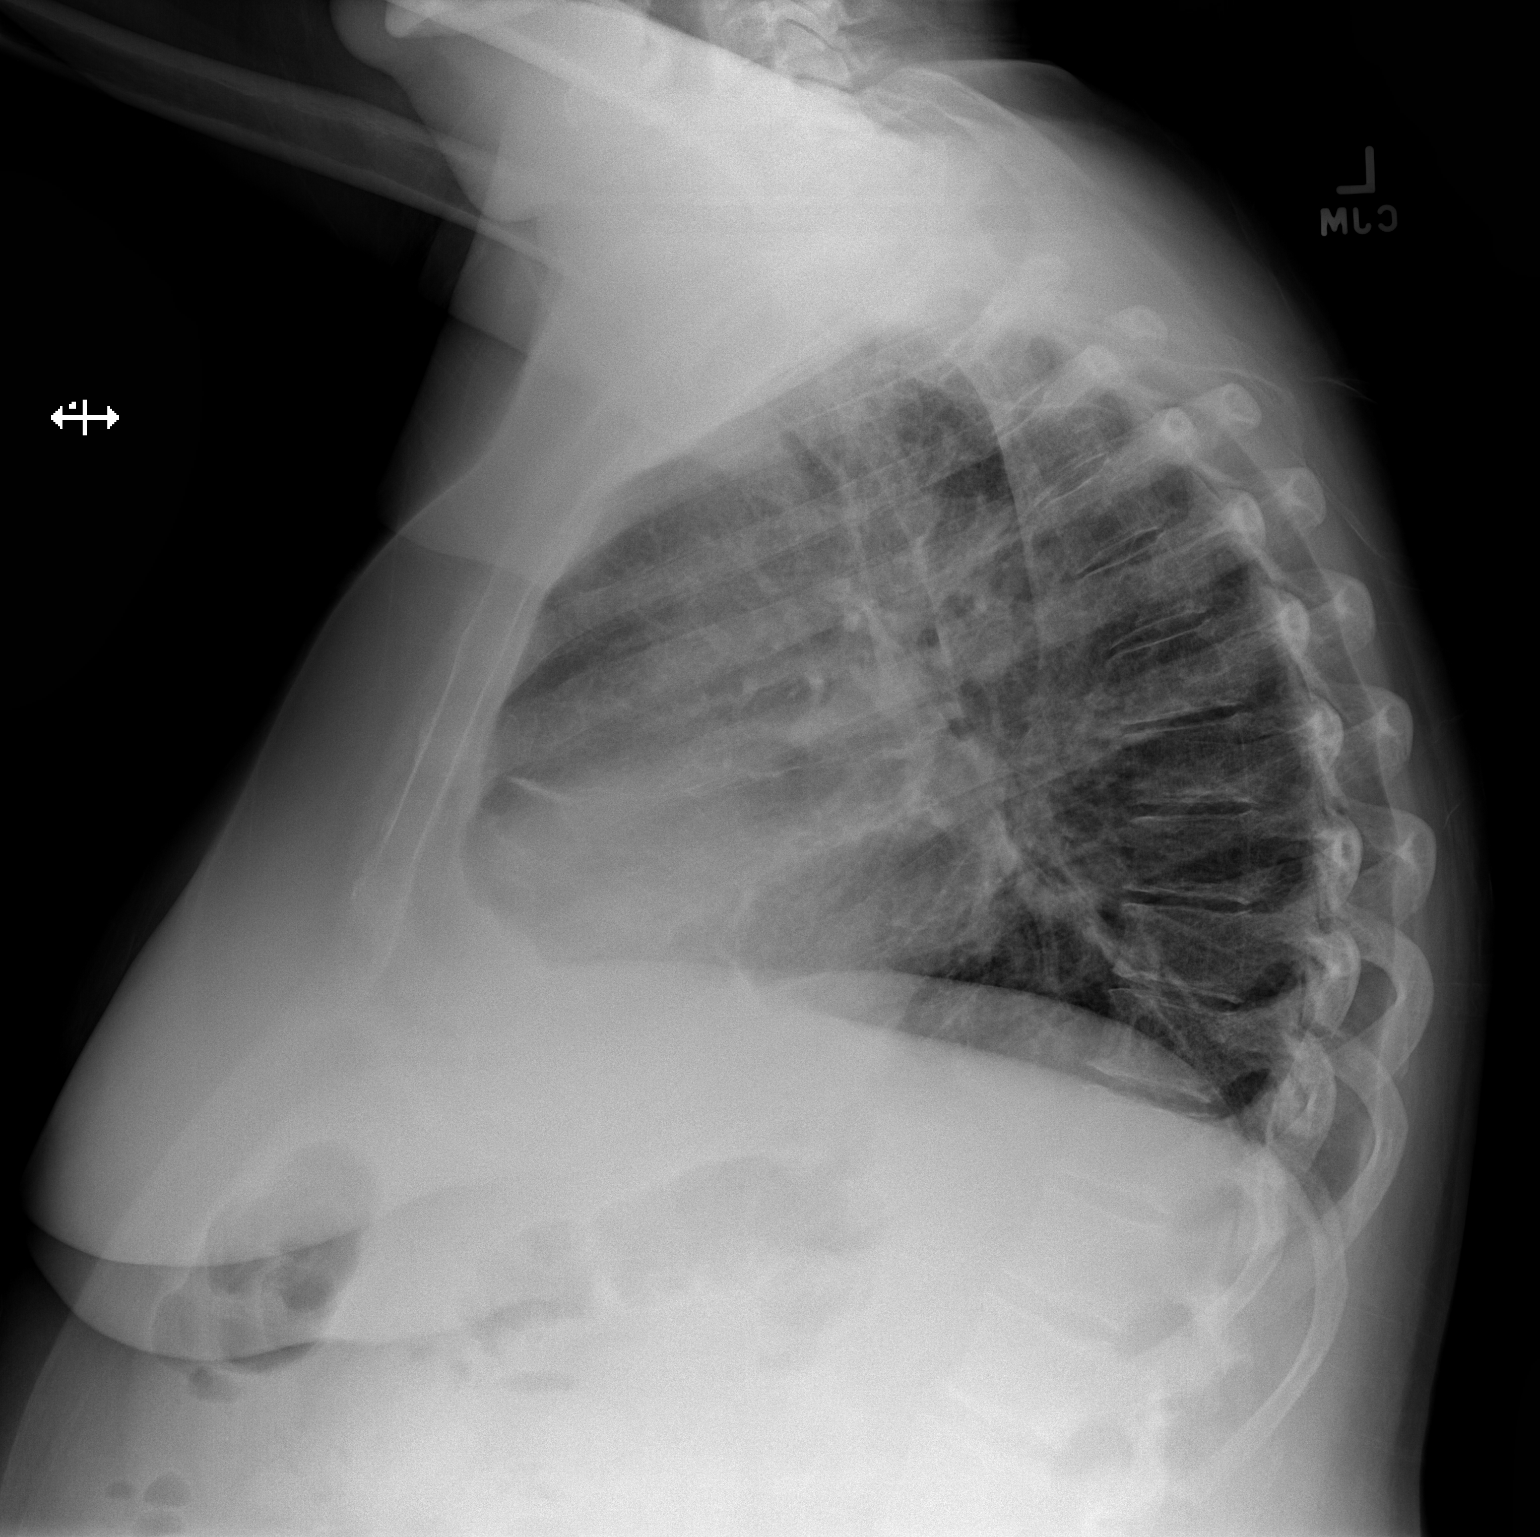

[2 of 2 positions shown; findings below may reference images not displayed]

FINDINGS: No active infiltrate or effusion is seen. Mediastinal and hilar
contours are unremarkable. The heart is borderline enlarged. No bony
abnormality is seen.
IMPRESSION: Borderline cardiomegaly.  No active lung disease.

## 2015-11-04 ENCOUNTER — Ambulatory Visit (HOSPITAL_COMMUNITY): Payer: Medicaid Other

## 2015-11-04 ENCOUNTER — Encounter (HOSPITAL_COMMUNITY): Payer: Medicare Other

## 2015-11-04 ENCOUNTER — Encounter: Payer: Self-pay | Admitting: Cardiology

## 2015-11-05 IMAGING — CR DG WRIST 2V*L*
2 series · 2 of 2 positions shown · non-contrast
Comparison: None.

CLINICAL DATA: Recent fracture with cast removal. Pain and swelling

EXAM:
LEFT WRIST - 2 VIEW

[x wrist pa left]
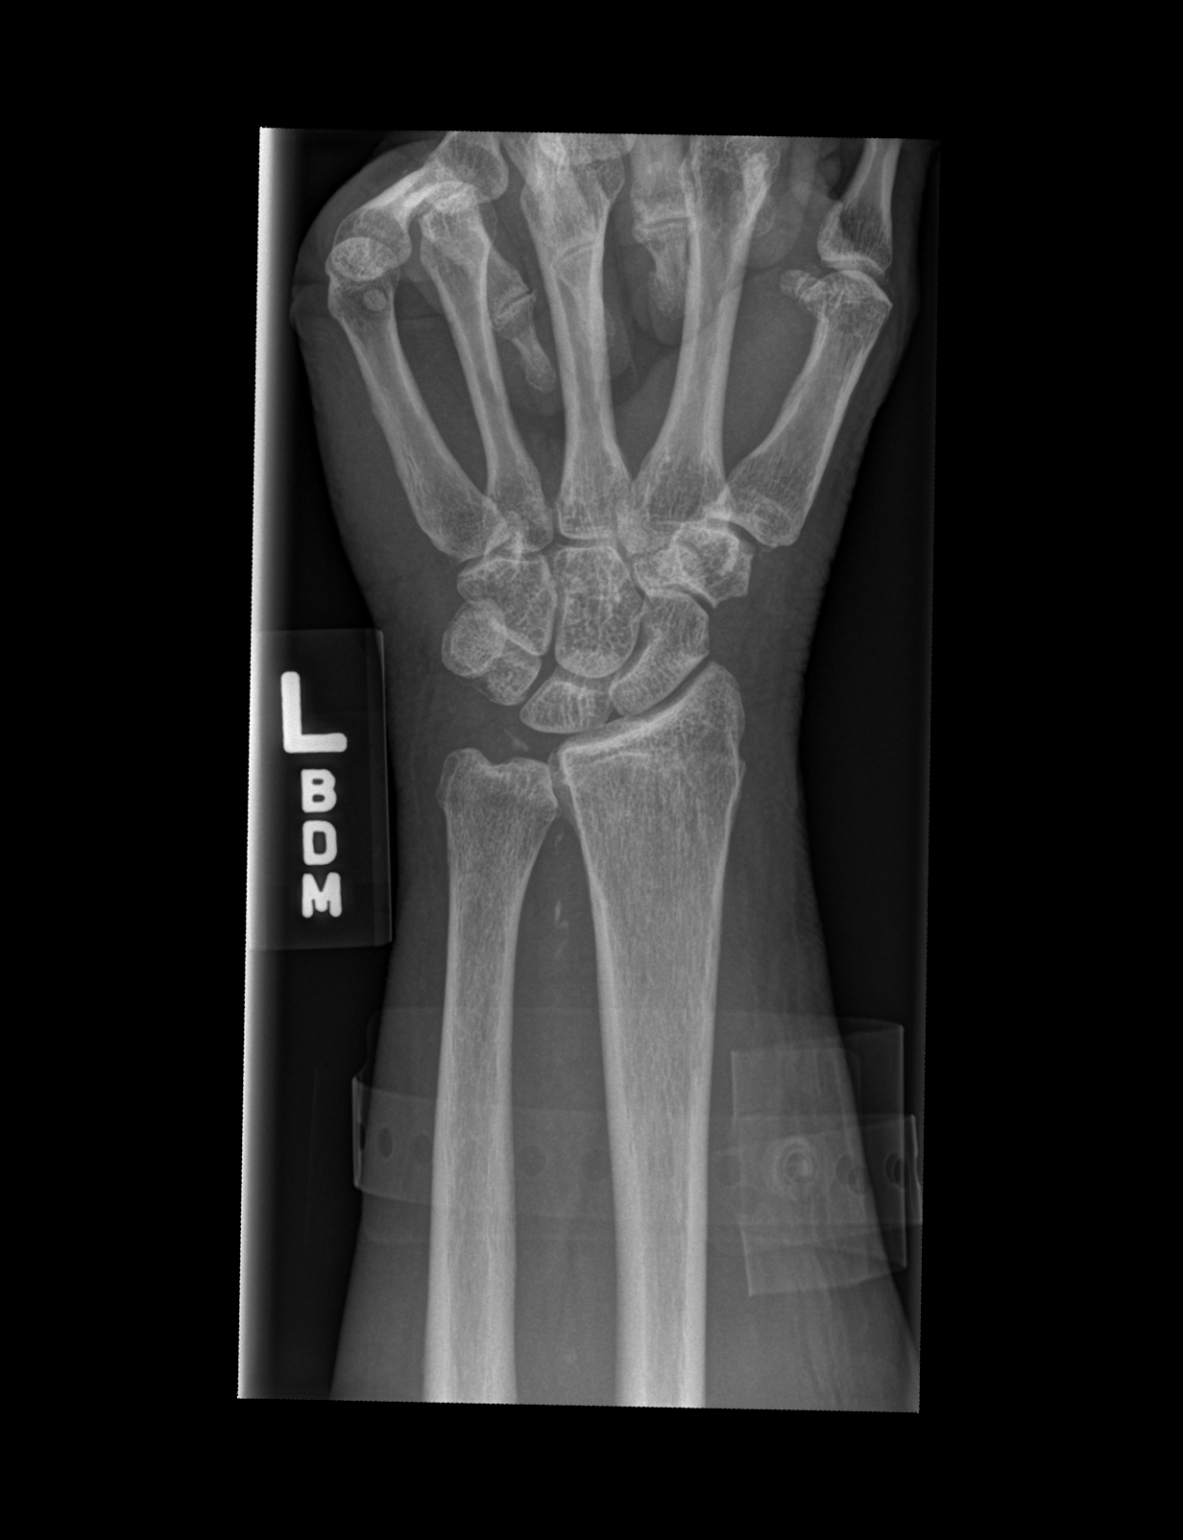

[x wrist lat left]
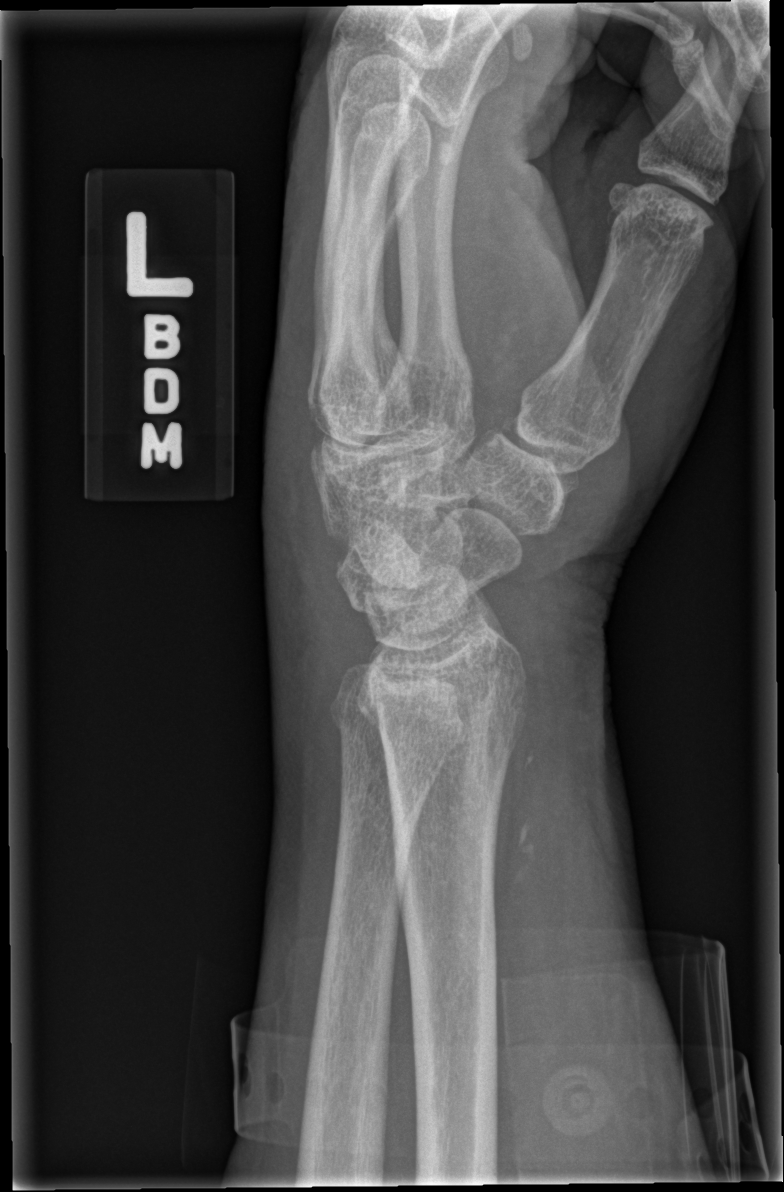

[2 of 2 positions shown; findings below may reference images not displayed]

FINDINGS: Frontal and lateral views were obtained. There is a healing subtle
nondisplaced fracture of the distal radial metaphysis in anatomic
alignment. No acute fracture seen. No dislocation. Calcification is
noted in the triangular fibrocartilage region. There is mild
narrowing of the scaphotrapezial joint.
IMPRESSION: Subtle nondisplaced fracture distal radius, healing. No new
fracture. No dislocation. Question chronic tear in the triangular
fibrocartilage region.

## 2015-11-06 ENCOUNTER — Ambulatory Visit (HOSPITAL_COMMUNITY): Payer: Medicaid Other

## 2015-11-06 ENCOUNTER — Encounter (HOSPITAL_COMMUNITY): Payer: Medicare Other

## 2015-11-07 ENCOUNTER — Observation Stay (HOSPITAL_COMMUNITY): Payer: Medicare Other

## 2015-11-07 ENCOUNTER — Inpatient Hospital Stay (HOSPITAL_COMMUNITY)
Admission: AD | Admit: 2015-11-07 | Discharge: 2015-11-09 | DRG: 311 | Disposition: A | Discharge status: Home / Self Care | Payer: Medicare Other | Source: Other Acute Inpatient Hospital | Attending: Internal Medicine | Admitting: Internal Medicine

## 2015-11-07 ENCOUNTER — Encounter (HOSPITAL_COMMUNITY): Payer: Self-pay | Admitting: *Deleted

## 2015-11-07 DIAGNOSIS — E669 Obesity, unspecified: Secondary | ICD-10-CM | POA: Diagnosis present

## 2015-11-07 DIAGNOSIS — I200 Unstable angina: Secondary | ICD-10-CM

## 2015-11-07 DIAGNOSIS — F419 Anxiety disorder, unspecified: Secondary | ICD-10-CM | POA: Diagnosis present

## 2015-11-07 DIAGNOSIS — D519 Vitamin B12 deficiency anemia, unspecified: Secondary | ICD-10-CM | POA: Diagnosis present

## 2015-11-07 DIAGNOSIS — Z7984 Long term (current) use of oral hypoglycemic drugs: Secondary | ICD-10-CM

## 2015-11-07 DIAGNOSIS — D649 Anemia, unspecified: Secondary | ICD-10-CM | POA: Diagnosis present

## 2015-11-07 DIAGNOSIS — R0602 Shortness of breath: Secondary | ICD-10-CM

## 2015-11-07 DIAGNOSIS — Z825 Family history of asthma and other chronic lower respiratory diseases: Secondary | ICD-10-CM

## 2015-11-07 DIAGNOSIS — E039 Hypothyroidism, unspecified: Secondary | ICD-10-CM | POA: Diagnosis present

## 2015-11-07 DIAGNOSIS — Z888 Allergy status to other drugs, medicaments and biological substances status: Secondary | ICD-10-CM

## 2015-11-07 DIAGNOSIS — I2510 Atherosclerotic heart disease of native coronary artery without angina pectoris: Secondary | ICD-10-CM | POA: Diagnosis not present

## 2015-11-07 DIAGNOSIS — Z8041 Family history of malignant neoplasm of ovary: Secondary | ICD-10-CM

## 2015-11-07 DIAGNOSIS — I739 Peripheral vascular disease, unspecified: Secondary | ICD-10-CM

## 2015-11-07 DIAGNOSIS — E1142 Type 2 diabetes mellitus with diabetic polyneuropathy: Secondary | ICD-10-CM | POA: Diagnosis present

## 2015-11-07 DIAGNOSIS — D72829 Elevated white blood cell count, unspecified: Secondary | ICD-10-CM | POA: Diagnosis present

## 2015-11-07 DIAGNOSIS — I214 Non-ST elevation (NSTEMI) myocardial infarction: Secondary | ICD-10-CM | POA: Diagnosis present

## 2015-11-07 DIAGNOSIS — E785 Hyperlipidemia, unspecified: Secondary | ICD-10-CM | POA: Diagnosis present

## 2015-11-07 DIAGNOSIS — E1165 Type 2 diabetes mellitus with hyperglycemia: Secondary | ICD-10-CM | POA: Diagnosis present

## 2015-11-07 DIAGNOSIS — N390 Urinary tract infection, site not specified: Secondary | ICD-10-CM | POA: Diagnosis present

## 2015-11-07 DIAGNOSIS — I6523 Occlusion and stenosis of bilateral carotid arteries: Secondary | ICD-10-CM | POA: Diagnosis present

## 2015-11-07 DIAGNOSIS — I110 Hypertensive heart disease with heart failure: Secondary | ICD-10-CM | POA: Diagnosis present

## 2015-11-07 DIAGNOSIS — F17290 Nicotine dependence, other tobacco product, uncomplicated: Secondary | ICD-10-CM | POA: Diagnosis present

## 2015-11-07 DIAGNOSIS — K7030 Alcoholic cirrhosis of liver without ascites: Secondary | ICD-10-CM | POA: Diagnosis present

## 2015-11-07 DIAGNOSIS — E1151 Type 2 diabetes mellitus with diabetic peripheral angiopathy without gangrene: Secondary | ICD-10-CM | POA: Diagnosis present

## 2015-11-07 DIAGNOSIS — E119 Type 2 diabetes mellitus without complications: Secondary | ICD-10-CM | POA: Diagnosis present

## 2015-11-07 DIAGNOSIS — I248 Other forms of acute ischemic heart disease: Principal | ICD-10-CM | POA: Diagnosis present

## 2015-11-07 DIAGNOSIS — Z8249 Family history of ischemic heart disease and other diseases of the circulatory system: Secondary | ICD-10-CM

## 2015-11-07 DIAGNOSIS — Z6841 Body Mass Index (BMI) 40.0 and over, adult: Secondary | ICD-10-CM

## 2015-11-07 DIAGNOSIS — D638 Anemia in other chronic diseases classified elsewhere: Secondary | ICD-10-CM | POA: Diagnosis present

## 2015-11-07 DIAGNOSIS — E1169 Type 2 diabetes mellitus with other specified complication: Secondary | ICD-10-CM | POA: Diagnosis present

## 2015-11-07 DIAGNOSIS — I480 Paroxysmal atrial fibrillation: Secondary | ICD-10-CM | POA: Diagnosis present

## 2015-11-07 DIAGNOSIS — Z803 Family history of malignant neoplasm of breast: Secondary | ICD-10-CM

## 2015-11-07 DIAGNOSIS — R509 Fever, unspecified: Secondary | ICD-10-CM | POA: Diagnosis not present

## 2015-11-07 DIAGNOSIS — Z720 Tobacco use: Secondary | ICD-10-CM | POA: Diagnosis present

## 2015-11-07 DIAGNOSIS — J449 Chronic obstructive pulmonary disease, unspecified: Secondary | ICD-10-CM | POA: Diagnosis present

## 2015-11-07 DIAGNOSIS — E876 Hypokalemia: Secondary | ICD-10-CM

## 2015-11-07 DIAGNOSIS — Z833 Family history of diabetes mellitus: Secondary | ICD-10-CM

## 2015-11-07 DIAGNOSIS — Z794 Long term (current) use of insulin: Secondary | ICD-10-CM

## 2015-11-07 DIAGNOSIS — Z800 Family history of malignant neoplasm of digestive organs: Secondary | ICD-10-CM

## 2015-11-07 DIAGNOSIS — I429 Cardiomyopathy, unspecified: Secondary | ICD-10-CM | POA: Diagnosis present

## 2015-11-07 DIAGNOSIS — K219 Gastro-esophageal reflux disease without esophagitis: Secondary | ICD-10-CM | POA: Diagnosis present

## 2015-11-07 DIAGNOSIS — I252 Old myocardial infarction: Secondary | ICD-10-CM

## 2015-11-07 DIAGNOSIS — I5022 Chronic systolic (congestive) heart failure: Secondary | ICD-10-CM | POA: Diagnosis present

## 2015-11-07 LAB — GLUCOSE, CAPILLARY
GLUCOSE-CAPILLARY: 102 mg/dL — AB (ref 65–99)
GLUCOSE-CAPILLARY: 202 mg/dL — AB (ref 65–99)
GLUCOSE-CAPILLARY: 227 mg/dL — AB (ref 65–99)
Glucose-Capillary: 82 mg/dL (ref 65–99)

## 2015-11-07 LAB — BLOOD GAS, ARTERIAL
ACID-BASE EXCESS: 7.9 mmol/L — AB (ref 0.0–2.0)
BICARBONATE: 32.5 meq/L — AB (ref 20.0–24.0)
Drawn by: 398661
O2 Content: 2 L/min
O2 SAT: 95.6 %
PATIENT TEMPERATURE: 98.6
PO2 ART: 80.3 mmHg (ref 80.0–100.0)
TCO2: 34.1 mmol/L (ref 0–100)
pCO2 arterial: 51 mmHg — ABNORMAL HIGH (ref 35.0–45.0)
pH, Arterial: 7.421 (ref 7.350–7.450)

## 2015-11-07 LAB — COMPREHENSIVE METABOLIC PANEL
ALK PHOS: 245 U/L — AB (ref 38–126)
ALT: 59 U/L — ABNORMAL HIGH (ref 14–54)
ANION GAP: 10 (ref 5–15)
AST: 55 U/L — AB (ref 15–41)
Albumin: 2.9 g/dL — ABNORMAL LOW (ref 3.5–5.0)
BILIRUBIN TOTAL: 0.6 mg/dL (ref 0.3–1.2)
BUN: 24 mg/dL — AB (ref 6–20)
CALCIUM: 7.9 mg/dL — AB (ref 8.9–10.3)
CO2: 33 mmol/L — ABNORMAL HIGH (ref 22–32)
Chloride: 95 mmol/L — ABNORMAL LOW (ref 101–111)
Creatinine, Ser: 1.26 mg/dL — ABNORMAL HIGH (ref 0.44–1.00)
GFR calc Af Amer: 55 mL/min — ABNORMAL LOW (ref >60)
GFR, EST NON AFRICAN AMERICAN: 48 mL/min — AB (ref >60)
Glucose, Bld: 215 mg/dL — ABNORMAL HIGH (ref 65–99)
POTASSIUM: 3.6 mmol/L (ref 3.5–5.1)
Sodium: 138 mmol/L (ref 135–145)
TOTAL PROTEIN: 6.8 g/dL (ref 6.5–8.1)

## 2015-11-07 LAB — CBC WITH DIFFERENTIAL/PLATELET
BASOS ABS: 0 10*3/uL (ref 0.0–0.1)
BASOS PCT: 0 %
EOS PCT: 2 %
Eosinophils Absolute: 0.4 10*3/uL (ref 0.0–0.7)
HCT: 29.8 % — ABNORMAL LOW (ref 36.0–46.0)
Hemoglobin: 9.5 g/dL — ABNORMAL LOW (ref 12.0–15.0)
LYMPHS PCT: 11 %
Lymphs Abs: 2.1 10*3/uL (ref 0.7–4.0)
MCH: 29 pg (ref 26.0–34.0)
MCHC: 31.9 g/dL (ref 30.0–36.0)
MCV: 90.9 fL (ref 78.0–100.0)
Monocytes Absolute: 0.8 10*3/uL (ref 0.1–1.0)
Monocytes Relative: 4 %
Neutro Abs: 15.1 10*3/uL — ABNORMAL HIGH (ref 1.7–7.7)
Neutrophils Relative %: 83 %
PLATELETS: 298 10*3/uL (ref 150–400)
RBC: 3.28 MIL/uL — AB (ref 3.87–5.11)
RDW: 14.1 % (ref 11.5–15.5)
WBC: 18.3 10*3/uL — AB (ref 4.0–10.5)

## 2015-11-07 LAB — RAPID URINE DRUG SCREEN, HOSP PERFORMED
Amphetamines: NOT DETECTED
Barbiturates: NOT DETECTED
Benzodiazepines: NOT DETECTED
Cocaine: NOT DETECTED
Opiates: POSITIVE — AB
Tetrahydrocannabinol: NOT DETECTED

## 2015-11-07 LAB — TROPONIN I
TROPONIN I: 0.46 ng/mL — AB (ref <0.031)
Troponin I: 0.61 ng/mL (ref <0.031)

## 2015-11-07 LAB — PROTIME-INR
INR: 1.2 (ref 0.00–1.49)
PROTHROMBIN TIME: 15.4 s — AB (ref 11.6–15.2)

## 2015-11-07 LAB — HEPARIN LEVEL (UNFRACTIONATED)

## 2015-11-07 LAB — MRSA PCR SCREENING: MRSA BY PCR: NEGATIVE

## 2015-11-07 LAB — APTT: aPTT: 44 seconds — ABNORMAL HIGH (ref 24–37)

## 2015-11-07 MED ORDER — INSULIN GLARGINE 100 UNIT/ML ~~LOC~~ SOLN
60.0000 [IU] | Freq: Every day | SUBCUTANEOUS | Status: DC
  Administered 2015-11-07 – 2015-11-08 (×2): 60 [IU] via SUBCUTANEOUS
  Filled 2015-11-07 (×4): qty 0.6

## 2015-11-07 MED ORDER — FUROSEMIDE 40 MG PO TABS
40.0000 mg | ORAL_TABLET | Freq: Two times a day (BID) | ORAL | Status: DC
  Administered 2015-11-08 – 2015-11-09 (×3): 40 mg via ORAL
  Filled 2015-11-07 (×3): qty 1

## 2015-11-07 MED ORDER — CARVEDILOL 25 MG PO TABS
37.5000 mg | ORAL_TABLET | Freq: Two times a day (BID) | ORAL | Status: DC
  Administered 2015-11-08 – 2015-11-09 (×4): 37.5 mg via ORAL
  Filled 2015-11-07 (×4): qty 1

## 2015-11-07 MED ORDER — ACETAMINOPHEN-CODEINE #3 300-30 MG PO TABS: 1.0000 | ORAL_TABLET | ORAL | Status: DC | PRN

## 2015-11-07 MED ORDER — ACETAMINOPHEN 325 MG PO TABS
650.0000 mg | ORAL_TABLET | ORAL | Status: DC | PRN
  Administered 2015-11-07 – 2015-11-09 (×7): 650 mg via ORAL
  Filled 2015-11-07 (×7): qty 2

## 2015-11-07 MED ORDER — HEPARIN (PORCINE) IN NACL 100-0.45 UNIT/ML-% IJ SOLN
1000.0000 [IU]/h | INTRAMUSCULAR | Status: DC
  Administered 2015-11-07: 1000 [IU]/h via INTRAVENOUS

## 2015-11-07 MED ORDER — HEPARIN BOLUS VIA INFUSION
4000.0000 [IU] | Freq: Once | INTRAVENOUS | Status: DC
  Filled 2015-11-07: qty 4000

## 2015-11-07 MED ORDER — CLOPIDOGREL BISULFATE 75 MG PO TABS
75.0000 mg | ORAL_TABLET | Freq: Every day | ORAL | Status: DC
  Administered 2015-11-08 – 2015-11-09 (×2): 75 mg via ORAL
  Filled 2015-11-07 (×2): qty 1

## 2015-11-07 MED ORDER — LEVOTHYROXINE SODIUM 50 MCG PO TABS
50.0000 ug | ORAL_TABLET | Freq: Every day | ORAL | Status: DC
  Administered 2015-11-08 – 2015-11-09 (×2): 50 ug via ORAL
  Filled 2015-11-07 (×2): qty 1

## 2015-11-07 MED ORDER — ALBUTEROL SULFATE (2.5 MG/3ML) 0.083% IN NEBU
3.0000 mL | INHALATION_SOLUTION | Freq: Four times a day (QID) | RESPIRATORY_TRACT | Status: DC | PRN
  Administered 2015-11-07: 3 mL via RESPIRATORY_TRACT
  Filled 2015-11-07: qty 3

## 2015-11-07 MED ORDER — GABAPENTIN (ONCE-DAILY) 300 MG PO TABS
1.0000 | ORAL_TABLET | Freq: Four times a day (QID) | ORAL | Status: DC
  Administered 2015-11-08 – 2015-11-09 (×6): 1 via ORAL
  Filled 2015-11-07 (×9): qty 1

## 2015-11-07 MED ORDER — THIAMINE HCL 100 MG/ML IJ SOLN
100.0000 mg | Freq: Every day | INTRAMUSCULAR | Status: DC
  Filled 2015-11-07: qty 2

## 2015-11-07 MED ORDER — PANTOPRAZOLE SODIUM 40 MG PO TBEC
40.0000 mg | DELAYED_RELEASE_TABLET | Freq: Every day | ORAL | Status: DC
  Administered 2015-11-08 – 2015-11-09 (×2): 40 mg via ORAL
  Filled 2015-11-07 (×2): qty 1

## 2015-11-07 MED ORDER — DILTIAZEM HCL ER COATED BEADS 180 MG PO CP24
180.0000 mg | ORAL_CAPSULE | Freq: Every day | ORAL | Status: DC
  Administered 2015-11-08 – 2015-11-09 (×2): 180 mg via ORAL
  Filled 2015-11-07 (×2): qty 1

## 2015-11-07 MED ORDER — LORAZEPAM 2 MG/ML IJ SOLN: 1.0000 mg | Freq: Four times a day (QID) | INTRAMUSCULAR | Status: DC | PRN

## 2015-11-07 MED ORDER — PROMETHAZINE HCL 25 MG PO TABS: 12.5000 mg | ORAL_TABLET | Freq: Four times a day (QID) | ORAL | Status: DC | PRN

## 2015-11-07 MED ORDER — LORAZEPAM 2 MG/ML IJ SOLN
0.0000 mg | Freq: Four times a day (QID) | INTRAMUSCULAR | Status: DC
  Administered 2015-11-07: 1 mg via INTRAVENOUS
  Filled 2015-11-07: qty 1

## 2015-11-07 MED ORDER — ONDANSETRON HCL 4 MG/2ML IJ SOLN
4.0000 mg | Freq: Four times a day (QID) | INTRAMUSCULAR | Status: DC | PRN
Start: 2015-11-07 — End: 2015-11-09

## 2015-11-07 MED ORDER — LORAZEPAM 1 MG PO TABS: 1.0000 mg | ORAL_TABLET | Freq: Four times a day (QID) | ORAL | Status: DC | PRN

## 2015-11-07 MED ORDER — ADULT MULTIVITAMIN W/MINERALS CH
1.0000 | ORAL_TABLET | Freq: Every day | ORAL | Status: DC
  Administered 2015-11-08 – 2015-11-09 (×2): 1 via ORAL
  Filled 2015-11-07 (×2): qty 1

## 2015-11-07 MED ORDER — LORAZEPAM 2 MG/ML IJ SOLN: 0.0000 mg | Freq: Two times a day (BID) | INTRAMUSCULAR | Status: DC

## 2015-11-07 MED ORDER — INSULIN ASPART 100 UNIT/ML ~~LOC~~ SOLN
0.0000 [IU] | Freq: Three times a day (TID) | SUBCUTANEOUS | Status: DC
  Administered 2015-11-08: 3 [IU] via SUBCUTANEOUS
  Administered 2015-11-08: 9 [IU] via SUBCUTANEOUS
  Administered 2015-11-08: 2 [IU] via SUBCUTANEOUS
  Administered 2015-11-09: 7 [IU] via SUBCUTANEOUS
  Administered 2015-11-09: 9 [IU] via SUBCUTANEOUS
  Administered 2015-11-09: 3 [IU] via SUBCUTANEOUS

## 2015-11-07 MED ORDER — METOCLOPRAMIDE HCL 5 MG PO TABS: 5.0000 mg | ORAL_TABLET | Freq: Four times a day (QID) | ORAL | Status: DC | PRN

## 2015-11-07 MED ORDER — FOLIC ACID 1 MG PO TABS
1.0000 mg | ORAL_TABLET | Freq: Every day | ORAL | Status: DC
  Administered 2015-11-08 – 2015-11-09 (×2): 1 mg via ORAL
  Filled 2015-11-07 (×2): qty 1

## 2015-11-07 MED ORDER — VITAMIN B-1 100 MG PO TABS
100.0000 mg | ORAL_TABLET | Freq: Every day | ORAL | Status: DC
  Administered 2015-11-08 – 2015-11-09 (×2): 100 mg via ORAL
  Filled 2015-11-07 (×2): qty 1

## 2015-11-07 MED ORDER — NITROGLYCERIN 0.4 MG SL SUBL: 0.4000 mg | SUBLINGUAL_TABLET | SUBLINGUAL | Status: DC | PRN

## 2015-11-07 MED ORDER — ATORVASTATIN CALCIUM 40 MG PO TABS
40.0000 mg | ORAL_TABLET | Freq: Every day | ORAL | Status: DC
Start: 2015-11-08 — End: 2015-11-09
  Administered 2015-11-08: 40 mg via ORAL
  Filled 2015-11-07: qty 1

## 2015-11-07 MED ORDER — INSULIN ASPART 100 UNIT/ML ~~LOC~~ SOLN
0.0000 [IU] | Freq: Every day | SUBCUTANEOUS | Status: DC
  Administered 2015-11-07: 2 [IU] via SUBCUTANEOUS
  Administered 2015-11-08: 5 [IU] via SUBCUTANEOUS

## 2015-11-07 NOTE — Progress Notes (Addendum)
Otelia Sergeant, MD notified: 2nd troponin 0.61; pt lethargic & slow to answer orientation questions and unclear about time & situation. Pt tremors in all extremities. Pt states last alcohol consumption over 3 years ago when asked by this RN. When asked at change of shift, patient reported drinks mixed drinks every once in a while. O2 sats 87 on room air. Audible expiratory wheezes all lobes and rhonchus in bilateral upper lobes. Patient no c/o pain anywhere. RT called to admin PRN nebs. New orders received for ABG, CXR, BCx2, CIWA protocol, and UA. Patient febrile 102.5, Tylenol given. Will continue to monitor patient closely.

## 2015-11-07 NOTE — Progress Notes (Signed)
Pt arrived from [place], [place]. Pt on Heparin and Nitro. Heparin restarted at 10U. Nitro Gtt dc'd per MD. CHG and MRSA complete

## 2015-11-07 NOTE — Progress Notes (Addendum)
ANTICOAGULATION CONSULT NOTE - Initial Consult  Pharmacy Consult for heparin Indication: ACS/STEMI  Allergies  Allergen Reactions  . Amiodarone Nausea And Vomiting    Patient Measurements: Height: 5\' 4"  (162.6 cm) Weight: 238 lb 11.2 oz (108.274 kg) IBW/kg (Calculated) : 54.7  Heparin dosing wt: 80 kg  Vital Signs: Temp: 100.1 F (37.8 C) (12/24 1428) Temp Source: Oral (12/24 1140) BP: 156/73 mmHg (12/24 1220) Pulse Rate: 106 (12/24 1220)  Labs: No results for input(s): HGB, HCT, PLT, APTT, LABPROT, INR, HEPARINUNFRC, CREATININE, CKTOTAL, CKMB, TROPONINI in the last 72 hours.  Estimated Creatinine Clearance: 57.1 mL/min (by C-G formula based on Cr of 1.37).   Medical History: Past Medical History  Diagnosis Date  . Alcohol abuse   . Narcotic abuse   . Cocaine abuse   . Marijuana abuse   . Tobacco abuse   . Alcoholic cirrhosis ([place])   . Cardiomyopathy ([place])   . Obesity   . Chronic systolic CHF (congestive heart failure) ([place])     a. Last echo 12/205: EF 40-45%, inferoapical/posterior HK, not technically sufficient to allow eval of LV diastolic function, normal LA in size..  . Hypothyroidism   . GERD (gastroesophageal reflux disease)   . PAF (paroxysmal atrial fibrillation) ([place])   . Critical lower limb ischemia   . Hypertension   . CAD (coronary artery disease)     a. cath 11/10/2014 small vessel CAD. Demand ischemia in the setting of rapid a-fib  . Anxiety   . Poorly controlled type 2 diabetes mellitus ([place])   . Hyperlipemia   . Peripheral arterial disease ([place])     a. 01/2015 Angio/PTA: LSFA 100 w/ recon @ adductor canal and 1 vessel runoff via AT, RSFA 99 (atherectomy/pta) - 1 vessel runoff via diff dzs peroneal.  . Carotid artery disease ([place])     a. 01/2015 Carotid Angio: RICA [id number], LICA [id number]. s/p L carotid endarterectomy 02/2015.  [name] COPD (chronic obstructive pulmonary disease) ([place])   . Diabetic peripheral neuropathy [facility])    Assessment:  54 y/o F on 12/24  from [place] and was on heparin gtt for ACS/STEMI. Pharmacy consulted to dose. No labs currently.   Goal of Therapy:  Heparin level 0.3-0.7 units/ml Monitor platelets by anticoagulation protocol: Yes   Plan:  Continue heparin gtt 1000 units/hr 6 hr HL Daily HL, CBC Monitor for S&S of bleed  Angela Burke, PharmD Pharmacy Resident Pager: [phone] 11/07/2015,2:37 PM

## 2015-11-07 NOTE — H&P (Signed)
Patient ID: [name] MRN: [id number], DOB/AGE: Apr 30, 1961   Admit date: 11/07/2015   Primary Physician: Lynne Logan, MD Primary Cardiologist: Dr. Gwenlyn Found  Pt. Profile:  54 year old was history of HTN, DM, PAD (vasculopath) and chronic systolic heart failure with EF 40-45% transferred from [place] for chest pain and mildly elevated trop  Problem List  Past Medical History  Diagnosis Date  . Alcohol abuse   . Narcotic abuse   . Cocaine abuse   . Marijuana abuse   . Tobacco abuse   . Alcoholic cirrhosis ([place])   . Cardiomyopathy ([name])   . Obesity   . Chronic systolic CHF (congestive heart failure) ([place])     a. Last echo 12/205: EF 40-45%, inferoapical/posterior HK, not technically sufficient to allow eval of LV diastolic function, normal LA in size..  . Hypothyroidism   . GERD (gastroesophageal reflux disease)   . PAF (paroxysmal atrial fibrillation) ([place])   . Critical lower limb ischemia   . Hypertension   . CAD (coronary artery disease)     a. cath 11/10/2014 small vessel CAD. Demand ischemia in the setting of rapid a-fib  . Anxiety   . Poorly controlled type 2 diabetes mellitus ([place])   . Hyperlipemia   . Peripheral arterial disease ([place])     a. 01/2015 Angio/PTA: LSFA 100 w/ recon @ adductor canal and 1 vessel runoff via AT, RSFA 99 (atherectomy/pta) - 1 vessel runoff via diff dzs peroneal.  . Carotid artery disease ([place])     a. 01/2015 Carotid Angio: RICA 956, LICA 21H. s/p L carotid endarterectomy 02/2015.  [name] COPD (chronic obstructive pulmonary disease) ([place])   . Diabetic peripheral neuropathy [facility])     Past Surgical History  Procedure Laterality Date  . Cardioversion  ~ 02/2013    "twice"   . Lower extremity angiogram N/A 09/10/2013    Procedure: LOWER EXTREMITY ANGIOGRAM;  Surgeon: [name], MD;  Location: [facility] CATH LAB;  Service: Cardiovascular;  Laterality: N/A;  . Left heart catheterization with coronary angiogram N/A 10/31/2014    Procedure: LEFT  HEART CATHETERIZATION WITH CORONARY ANGIOGRAM;  Surgeon: Burnell Blanks, MD;  Location: [facility] CATH LAB;  Service: Cardiovascular;  Laterality: N/A;  . Peripheral athrectomy Right 01/15/2015    SFA/notes 01/15/2015  . Balloon angioplasty, artery Right 01/15/2015    SFA/notes 01/15/2015  . Cardiac catheterization    . Lower extremity angiogram N/A 01/15/2015    Procedure: LOWER EXTREMITY ANGIOGRAM;  Surgeon: [name], MD;  Location: [facility] CATH LAB;  Service: Cardiovascular;  Laterality: N/A;  . Carotid angiogram N/A 01/15/2015    Procedure: CAROTID ANGIOGRAM;  Surgeon: [name], MD;  Location: [facility] CATH LAB;  Service: Cardiovascular;  Laterality: N/A;  . Endarterectomy Left 02/19/2015    Procedure: LEFT CAROTID ENDARTERECTOMY ;  Surgeon: Serafina Mitchell, MD;  Location: [facility] OR;  Service: Vascular;  Laterality: Left;  . Dilation and curettage of uterus  1988     Allergies  Allergies  Allergen Reactions  . Amiodarone Nausea And Vomiting    HPI  The patient is a 54 year old was history of HTN, DM, PAD (vasculopath) and chronic systolic heart failure with EF 40-45%. She has had multiple admissions in the recent months. She had lower extremity angiography by Dr. Gwenlyn Found and underwent right SFA direction arthrectomy in 2014. Her last cardiac catheterization was on 10/31/2014 which showed EF 30-35%, with severe hypokinesis in the anterior wall and apex, inferoapical apex, diffuse small vessel disease including  small nondominant RCA and 80% small caliber OM1, 50% mid left circumflex stenosis. Her elevated troponin was felt to be related to atrial fibrillation with RVR and the underlying small vessel CAD. She was continued to be treated medically. Carotid Doppler showed high-grade bilateral ICA disease and a lower extremity Doppler revealed restenosis of the right SFA with ABI of 0.34. She had left carotid endarterectomy about 02/19/2015 by Dr. Trula Slade. Lower extremity Doppler study performed on 01/23/2015  showed ABI in the 0.6 range bilaterally was patent right SFA. She was admitted with atrial fibrillation with RVR in October and discharged on carvedilol. She was later readmitted was atrial fibrillation in the setting of not taking her carvedilol.  Her last admission was from 12/7-12/9 for chest pain, shortness breath, and atrial fibrillation with RVR.  She presented to [facility] in [place] /24/2016 with left shoulder and the left jaw pain around 5 AM in the morning of presentation. Initial troponin was elevated at 0.06. EKG showed T-wave inversion in the inferolateral leads. She was subsequently transferred to [facility] for further evaluation.  Outside labs include sodium 144, potassium 3.2, chloride 98, glucose 206, creatinine 1.1, total protein 6.6, calcium 8.2, albumin 2.7, alkaline phosphatase 223, total bilirubin 0.2. PT 16.4, INR 1.35. Chest x-ray was negative for acute infiltrate, pleural effusion, no pulmonary edema.    Home Medications  Prior to Admission medications   Medication Sig Start Date End Date Taking? Authorizing Provider  acetaminophen-codeine (TYLENOL #3) 300-30 MG tablet Take 1 tablet by mouth every 4 (four) hours as needed for moderate pain.    Historical Provider, MD  albuterol (PROVENTIL HFA;VENTOLIN HFA) 108 (90 BASE) MCG/ACT inhaler Inhale 2 puffs into the lungs every 6 (six) hours as needed for wheezing or shortness of breath. 06/30/15   Arnoldo Morale, MD  albuterol (PROVENTIL) (2.5 MG/3ML) 0.083% nebulizer solution Take 3 mLs (2.5 mg total) by nebulization every 6 (six) hours as needed. For wheezing. Patient not taking: Reported on 10/15/2015 06/30/15   Arnoldo Morale, MD  atorvastatin (LIPITOR) 40 MG tablet Take 1 tablet (40 mg total) by mouth daily. 06/30/15   Arnoldo Morale, MD  Blood Glucose Monitoring Suppl (ACCU-CHEK AVIVA PLUS) W/DEVICE KIT 1 Device by Does not apply route 4 (four) times daily -  before meals and at bedtime. 07/21/15    Arnoldo Morale, MD  carvedilol (COREG) 12.5 MG tablet Take 3 tablets (37.5 mg total) by mouth 2 (two) times daily with a meal. 09/07/15   Geradine Girt, DO  clopidogrel (PLAVIX) 75 MG tablet Take 1 tablet (75 mg total) by mouth daily with breakfast. 06/30/15   Arnoldo Morale, MD  cyclobenzaprine (FLEXERIL) 10 MG tablet Take 1 tablet (10 mg total) by mouth 3 (three) times daily as needed for muscle spasms. Patient not taking: Reported on 10/15/2015 07/21/15   Arnoldo Morale, MD  diltiazem (CARDIZEM CD) 180 MG 24 hr capsule Take 1 capsule (180 mg total) by mouth daily. 10/23/15   Eileen Stanford, PA-C  furosemide (LASIX) 40 MG tablet Take 1 tablet (40 mg total) by mouth 2 (two) times daily. 09/07/15   Geradine Girt, DO  Gabapentin, Once-Daily, (GRALISE) 300 MG TABS Take 1 tablet by mouth 4 (four) times daily.    Historical Provider, MD  glipiZIDE (GLUCOTROL XL) 10 MG 24 hr tablet Take 2 tablets (20 mg total) by mouth daily with breakfast. 06/30/15   Arnoldo Morale, MD  glucose blood (ACCU-CHEK AVIVA PLUS) test strip Use as  instructed 07/21/15   Arnoldo Morale, MD  insulin aspart (NOVOLOG) 100 UNIT/ML FlexPen Sliding scale Patient not taking: Reported on 10/15/2015 06/30/15   Arnoldo Morale, MD  insulin glargine (LANTUS) 100 UNIT/ML injection Inject 0.8 mLs (80 Units total) into the skin every morning. 10/23/15   Eileen Stanford, PA-C  Insulin Pen Needle (ULTICARE MICRO PEN NEEDLES) 32G X 4 MM MISC 1 Syringe by Does not apply route 4 (four) times daily - after meals and at bedtime. 06/30/15   Arnoldo Morale, MD  Lancets (ACCU-CHEK SOFT TOUCH) lancets Use as instructed 07/21/15   Arnoldo Morale, MD  levothyroxine (SYNTHROID, LEVOTHROID) 50 MCG tablet Take 1 tablet (50 mcg total) by mouth daily. 06/30/15   Arnoldo Morale, MD  metFORMIN (GLUCOPHAGE) 500 MG tablet Take 500 mg by mouth 2 (two) times daily with a meal.    Historical Provider, MD  metoCLOPramide (REGLAN) 5 MG tablet Take 5 mg by mouth every 6 (six) hours as needed  for nausea.    Historical Provider, MD  nitroGLYCERIN (NITROSTAT) 0.4 MG SL tablet Place 1 tablet (0.4 mg total) under the tongue every 5 (five) minutes as needed for chest pain. 01/16/15   Rogelia Mire, NP  pantoprazole (PROTONIX) 40 MG tablet Take 1 tablet (40 mg total) by mouth daily. 09/22/15 09/21/16  Arnoldo Morale, MD  promethazine (PHENERGAN) 12.5 MG tablet Take 12.5 mg by mouth every 6 (six) hours as needed for nausea or vomiting.    Historical Provider, MD  rivaroxaban (XARELTO) 20 MG TABS tablet Take 1 tablet (20 mg total) by mouth daily with supper. 06/30/15   Arnoldo Morale, MD  traMADol (ULTRAM) 50 MG tablet Take 1 tablet (50 mg total) by mouth every 12 (twelve) hours as needed for moderate pain. Patient not taking: Reported on 10/15/2015 09/07/15   Geradine Girt, DO    Family History  Family History  Problem Relation Age of Onset  . Hypertension Mother   . Diabetes Mother   . Cancer Mother     breast, ovarian, colon  . Clotting disorder Mother   . Heart disease Mother   . Heart attack Mother   . Hypertension Father   . Heart disease Father   . Emphysema Sister     smoked    Social History  Social History   Social History  . Marital Status: Divorced    Spouse Name: N/A  . Number of Children: N/A  . Years of Education: N/A   Occupational History  . disabled    Social History Main Topics  . Smoking status: Current Some Day Smoker -- 0.00 packs/day for 40 years    Types: E-cigarettes    Last Attempt to Quit: 02/19/2015  . Smokeless tobacco: Never Used  . Alcohol Use: No  . Drug Use: No     Comment: 04/29/2015 "last drug use was ~ 09/08/2013"  . Sexual Activity: No   Other Topics Concern  . Not on file   Social History Narrative     Review of Systems General:  No chills, fever, night sweats or weight changes.  Cardiovascular:  No dyspnea on exertion, edema, orthopnea, palpitations, paroxysmal nocturnal dyspnea. +chest pain Dermatological: No rash,  lesions/masses Respiratory: No cough, dyspnea Urologic: No hematuria, dysuria Abdominal:   No nausea, vomiting, diarrhea, bright red blood per rectum, melena, or hematemesis Neurologic:  No visual changes, wkns, changes in mental status. All other systems reviewed and are otherwise negative except as noted above.  Physical Exam  Blood  pressure 156/73, pulse 106, temperature 100.1 F (37.8 C), temperature source Oral, resp. rate 20, height '5\' 4"'  (1.626 m), weight 238 lb 11.2 oz (108.274 kg), last menstrual period 11/27/2012, SpO2 99 %.  General: Pleasant, NAD Psych: Normal affect. Neuro: Alert and oriented X 3. Moves all extremities spontaneously. HEENT: Normal  Neck: Supple without bruits or JVD. Lungs:  Resp regular and unlabored, CTA. Heart: RRR no s3, s4, or murmurs. Abdomen: Soft, non-tender, non-distended, BS + x 4.  Extremities: No clubbing, cyanosis or edema. DP/PT/Radials 2+ and equal bilaterally.  Labs  Troponin (Point of Care Test) No results for input(s): TROPIPOC in the last 72 hours. No results for input(s): CKTOTAL, CKMB, TROPONINI in the last 72 hours. Lab Results  Component Value Date   WBC 10.4 10/21/2015   HGB 9.7* 10/21/2015   HCT 30.9* 10/21/2015   MCV 89.6 10/21/2015   PLT 296 10/21/2015   No results for input(s): NA, K, CL, CO2, BUN, CREATININE, CALCIUM, PROT, BILITOT, ALKPHOS, ALT, AST, GLUCOSE in the last 168 hours.  Invalid input(s): LABALBU Lab Results  Component Value Date   CHOL 160 10/16/2015   HDL 35* 10/16/2015   LDLCALC 76 10/16/2015   TRIG 246* 10/16/2015   Lab Results  Component Value Date   DDIMER 0.48 10/29/2014     Radiology/Studies  Dg Chest 2 View  10/21/2015  CLINICAL DATA:  Acute onset chest pain tonight. Atrial fibrillation with rapid ventricular response. Hypertension. EXAM: CHEST  2 VIEW COMPARISON:  10/15/2015 FINDINGS: The heart size and mediastinal contours are within normal limits. Both lungs are clear. The visualized  skeletal structures are unremarkable. IMPRESSION: No active cardiopulmonary disease. Electronically Signed   By: Earle Gell M.D.   On: 10/21/2015 00:11   Dg Chest Portable 1 View  10/15/2015  CLINICAL DATA:  Chest pain and dyspnea, onset tonight. EXAM: PORTABLE CHEST 1 VIEW COMPARISON:  09/04/2015 FINDINGS: A single AP portable view of the chest demonstrates no focal airspace consolidation or alveolar edema. The lungs are grossly clear. There is no large effusion or pneumothorax. Cardiac and mediastinal contours appear unremarkable. IMPRESSION: No active disease. Electronically Signed   By: Andreas Newport M.D.   On: 10/15/2015 01:55    ECG  No new EKG  Echocardiogram 10/30/2014  LV EF: 40% -  45%  ------------------------------------------------------------------- History:  PMH: Polysubstance Abuse, ETOH, Nstemi. NSTEMI. Atrial fibrillation. Congestive heart failure. Chronic obstructive pulmonary disease. PMH:  Myocardial infarction. Risk factors: Current tobacco use. Diabetes mellitus.  ------------------------------------------------------------------- Study Conclusions  - Procedure narrative: Transthoracic echocardiography. Technically difficult study with reduced echocardiographic windows. - Left ventricle: The cavity size was normal. Wall thickness was normal. Systolic function was mildly to moderately reduced. The estimated ejection fraction was in the range of 40% to 45%. There appears to be inferoapical and posterior hypokinesis. The study is not technically sufficient to allow evaluation of LV diastolic function. - Left atrium: The atrium was normal in size.  Impressions:  - Technically difficult study. Compared to the prior echo in 2014, the EF is reduced to around 45% with what appears to be inferoapical and posterior hypokinesis.    ASSESSMENT AND PLAN  1. Chest pain with NSTEMI  - trend trop, known small vessel CAD with 80% OM and  non-dominant RCA. Previous h/o NSTEMI in the setting of afib with RVR, appears to be in NSR at this time.   2. H/o PAF on Xarelto: CHA2DS2-Vasc score 4 (HTN, DM, female, PAD/CAD)  3. chronic systolic heart failure  with EF 40-45%  4. HTN  5. DM  6. PAD (vasculopath): bilateral carotid artery stenosis with known occluded R ICA, s/p L CEA 02/2015. H/o R SFA atherectomy by Dr. Gwenlyn Found in 2014, last ABI: R 0.61 and L 0.65. Recommend repeat in 6 month, will check with MD regarding if  to obtain during this admission.  Hilbert Corrigan, PA-C 11/07/2015, 2:31 PM   Agree with note by Almyra Deforest PA-C  Pt well known to me with H/O CAD, carotid Dz and PVOD. Other probs as outlined including PAF with RVR on NOAC. She was just admitted several weeks ago with AF with RVR. She developed CP with jaw pain in [place] and was Tx to [facility]. Currently pain free. EKG w/o acute changes (NSR with inferolateral TWI). On Xarelto. Have put on IV hep. Cycle enz. She has been cathed in recent past w/o critical CAD.   [name], M.D., [place], [facility], [place] [address] [place], [place]  [address]  [phone] 11/07/2015 4:19 PM

## 2015-11-07 NOTE — Progress Notes (Signed)
Pt has taken all Morning and Evening Medications

## 2015-11-07 NOTE — Progress Notes (Signed)
[place] for heparin Indication: ACS/STEMI  Allergies  Allergen Reactions  . Amiodarone Nausea And Vomiting    Patient Measurements: Height: 5\' 4"  (162.6 cm) Weight: 238 lb 11.2 oz (108.274 kg) IBW/kg (Calculated) : 54.7  Heparin dosing wt: 80 kg  Vital Signs: Temp: 100.2 F (37.9 C) (12/24 2011) Temp Source: Oral (12/24 2011) BP: 122/81 mmHg (12/24 2011) Pulse Rate: 92 (12/24 2200)  Labs:  Recent Labs  11/07/15 1509 11/07/15 2100  HGB 9.5*  --   HCT 29.8*  --   PLT 298  --   LABPROT 15.4*  --   INR 1.20  --   HEPARINUNFRC  --  >2.20*  CREATININE 1.26*  --   TROPONINI 0.46* 0.61*    Estimated Creatinine Clearance: 62 mL/min (by C-G formula based on Cr of 1.26).   Medical History: Past Medical History  Diagnosis Date  . Alcohol abuse   . Narcotic abuse   . Cocaine abuse   . Marijuana abuse   . Tobacco abuse   . Alcoholic cirrhosis ([place])   . Cardiomyopathy ([place])   . Obesity   . Chronic systolic CHF (congestive heart failure) ([place])     a. Last echo 12/205: EF 40-45%, inferoapical/posterior HK, not technically sufficient to allow eval of LV diastolic function, normal LA in size..  . Hypothyroidism   . GERD (gastroesophageal reflux disease)   . PAF (paroxysmal atrial fibrillation) ([place])   . Critical lower limb ischemia   . Hypertension   . CAD (coronary artery disease)     a. cath 11/10/2014 small vessel CAD. Demand ischemia in the setting of rapid a-fib  . Anxiety   . Poorly controlled type 2 diabetes mellitus ([place])   . Hyperlipemia   . Peripheral arterial disease ([place])     a. 01/2015 Angio/PTA: LSFA 100 w/ recon @ adductor canal and 1 vessel runoff via AT, RSFA 99 (atherectomy/pta) - 1 vessel runoff via diff dzs peroneal.  . Carotid artery disease ([place])     a. 01/2015 Carotid Angio: RICA [id number], LICA [id number]. s/p L carotid endarterectomy 02/2015.  [name] COPD (chronic obstructive pulmonary disease) ([place])   . Diabetic  peripheral neuropathy [facility])    Assessment:  54 y/o F on 12/24 from [place] and was on heparin gtt for ACS/STEMI. Pharmacy consulted to dose. No labs currently. Pt from outside hospital - RN called bc heparin bag that is hanging is heparin 25000 units/500 ml - which is half the concentration of our heparin. So gtt running at 10 ml/hr which is actually 500 units/hr - Heparin level = >2.2  So it was then discovered that patient took a dose of Xarelto from her own supply tonight at 4 PM.  Spoke to Dr. Eula Fried, will stop IV heparin for now.  Will resume tomorrow at 4 PM when next dose of Xarelto would be due.  Goal of Therapy:  Monitor platelets by anticoagulation protocol: Yes   Plan:  1. Stop IV heparin now. 2. Resume IV heparin without bolus tomorrow at 4 PM. 3. F/u plans for cath, etc.  Uvaldo Rising, BCPS  Clinical Pharmacist Pager [phone]  11/07/2015 10:30 PM

## 2015-11-08 ENCOUNTER — Inpatient Hospital Stay (HOSPITAL_COMMUNITY): Payer: Medicare Other

## 2015-11-08 DIAGNOSIS — K219 Gastro-esophageal reflux disease without esophagitis: Secondary | ICD-10-CM | POA: Diagnosis present

## 2015-11-08 DIAGNOSIS — D638 Anemia in other chronic diseases classified elsewhere: Secondary | ICD-10-CM | POA: Diagnosis present

## 2015-11-08 DIAGNOSIS — I5032 Chronic diastolic (congestive) heart failure: Secondary | ICD-10-CM | POA: Diagnosis not present

## 2015-11-08 DIAGNOSIS — D513 Other dietary vitamin B12 deficiency anemia: Secondary | ICD-10-CM | POA: Diagnosis not present

## 2015-11-08 DIAGNOSIS — E876 Hypokalemia: Secondary | ICD-10-CM | POA: Diagnosis present

## 2015-11-08 DIAGNOSIS — D519 Vitamin B12 deficiency anemia, unspecified: Secondary | ICD-10-CM | POA: Diagnosis present

## 2015-11-08 DIAGNOSIS — E785 Hyperlipidemia, unspecified: Secondary | ICD-10-CM | POA: Diagnosis present

## 2015-11-08 DIAGNOSIS — D649 Anemia, unspecified: Secondary | ICD-10-CM | POA: Diagnosis present

## 2015-11-08 DIAGNOSIS — E1165 Type 2 diabetes mellitus with hyperglycemia: Secondary | ICD-10-CM | POA: Diagnosis present

## 2015-11-08 DIAGNOSIS — N390 Urinary tract infection, site not specified: Secondary | ICD-10-CM | POA: Diagnosis present

## 2015-11-08 DIAGNOSIS — F419 Anxiety disorder, unspecified: Secondary | ICD-10-CM | POA: Diagnosis present

## 2015-11-08 DIAGNOSIS — R509 Fever, unspecified: Secondary | ICD-10-CM | POA: Diagnosis not present

## 2015-11-08 DIAGNOSIS — I214 Non-ST elevation (NSTEMI) myocardial infarction: Secondary | ICD-10-CM | POA: Diagnosis present

## 2015-11-08 DIAGNOSIS — R0789 Other chest pain: Secondary | ICD-10-CM

## 2015-11-08 DIAGNOSIS — E1151 Type 2 diabetes mellitus with diabetic peripheral angiopathy without gangrene: Secondary | ICD-10-CM | POA: Diagnosis not present

## 2015-11-08 DIAGNOSIS — Z8041 Family history of malignant neoplasm of ovary: Secondary | ICD-10-CM | POA: Diagnosis not present

## 2015-11-08 DIAGNOSIS — I5022 Chronic systolic (congestive) heart failure: Secondary | ICD-10-CM | POA: Diagnosis present

## 2015-11-08 DIAGNOSIS — I248 Other forms of acute ischemic heart disease: Secondary | ICD-10-CM | POA: Diagnosis present

## 2015-11-08 DIAGNOSIS — I2510 Atherosclerotic heart disease of native coronary artery without angina pectoris: Secondary | ICD-10-CM | POA: Diagnosis present

## 2015-11-08 DIAGNOSIS — I6523 Occlusion and stenosis of bilateral carotid arteries: Secondary | ICD-10-CM | POA: Diagnosis present

## 2015-11-08 DIAGNOSIS — I252 Old myocardial infarction: Secondary | ICD-10-CM | POA: Diagnosis not present

## 2015-11-08 DIAGNOSIS — E669 Obesity, unspecified: Secondary | ICD-10-CM | POA: Diagnosis present

## 2015-11-08 DIAGNOSIS — I429 Cardiomyopathy, unspecified: Secondary | ICD-10-CM | POA: Diagnosis present

## 2015-11-08 DIAGNOSIS — Z8249 Family history of ischemic heart disease and other diseases of the circulatory system: Secondary | ICD-10-CM | POA: Diagnosis not present

## 2015-11-08 DIAGNOSIS — I480 Paroxysmal atrial fibrillation: Secondary | ICD-10-CM | POA: Diagnosis present

## 2015-11-08 DIAGNOSIS — I110 Hypertensive heart disease with heart failure: Secondary | ICD-10-CM | POA: Diagnosis present

## 2015-11-08 DIAGNOSIS — Z833 Family history of diabetes mellitus: Secondary | ICD-10-CM | POA: Diagnosis not present

## 2015-11-08 DIAGNOSIS — E1142 Type 2 diabetes mellitus with diabetic polyneuropathy: Secondary | ICD-10-CM | POA: Diagnosis present

## 2015-11-08 DIAGNOSIS — Z800 Family history of malignant neoplasm of digestive organs: Secondary | ICD-10-CM | POA: Diagnosis not present

## 2015-11-08 DIAGNOSIS — Z825 Family history of asthma and other chronic lower respiratory diseases: Secondary | ICD-10-CM | POA: Diagnosis not present

## 2015-11-08 DIAGNOSIS — J449 Chronic obstructive pulmonary disease, unspecified: Secondary | ICD-10-CM | POA: Diagnosis present

## 2015-11-08 DIAGNOSIS — Z803 Family history of malignant neoplasm of breast: Secondary | ICD-10-CM | POA: Diagnosis not present

## 2015-11-08 DIAGNOSIS — Z7984 Long term (current) use of oral hypoglycemic drugs: Secondary | ICD-10-CM | POA: Diagnosis not present

## 2015-11-08 DIAGNOSIS — E039 Hypothyroidism, unspecified: Secondary | ICD-10-CM | POA: Diagnosis present

## 2015-11-08 DIAGNOSIS — F17290 Nicotine dependence, other tobacco product, uncomplicated: Secondary | ICD-10-CM | POA: Diagnosis present

## 2015-11-08 DIAGNOSIS — K7030 Alcoholic cirrhosis of liver without ascites: Secondary | ICD-10-CM | POA: Diagnosis present

## 2015-11-08 DIAGNOSIS — Z888 Allergy status to other drugs, medicaments and biological substances status: Secondary | ICD-10-CM | POA: Diagnosis not present

## 2015-11-08 DIAGNOSIS — Z6841 Body Mass Index (BMI) 40.0 and over, adult: Secondary | ICD-10-CM | POA: Diagnosis not present

## 2015-11-08 DIAGNOSIS — Z794 Long term (current) use of insulin: Secondary | ICD-10-CM | POA: Diagnosis not present

## 2015-11-08 DIAGNOSIS — D72829 Elevated white blood cell count, unspecified: Secondary | ICD-10-CM | POA: Diagnosis present

## 2015-11-08 LAB — BASIC METABOLIC PANEL
ANION GAP: 11 (ref 5–15)
BUN: 30 mg/dL — ABNORMAL HIGH (ref 6–20)
CO2: 31 mmol/L (ref 22–32)
Calcium: 7.7 mg/dL — ABNORMAL LOW (ref 8.9–10.3)
Chloride: 97 mmol/L — ABNORMAL LOW (ref 101–111)
Creatinine, Ser: 1.52 mg/dL — ABNORMAL HIGH (ref 0.44–1.00)
GFR, EST AFRICAN AMERICAN: 44 mL/min — AB (ref >60)
GFR, EST NON AFRICAN AMERICAN: 38 mL/min — AB (ref >60)
Glucose, Bld: 72 mg/dL (ref 65–99)
POTASSIUM: 3.4 mmol/L — AB (ref 3.5–5.1)
SODIUM: 139 mmol/L (ref 135–145)

## 2015-11-08 LAB — CBC
HEMATOCRIT: 25.9 % — AB (ref 36.0–46.0)
HEMOGLOBIN: 8.1 g/dL — AB (ref 12.0–15.0)
MCH: 29 pg (ref 26.0–34.0)
MCHC: 31.3 g/dL (ref 30.0–36.0)
MCV: 92.8 fL (ref 78.0–100.0)
Platelets: 340 10*3/uL (ref 150–400)
RBC: 2.79 MIL/uL — ABNORMAL LOW (ref 3.87–5.11)
RDW: 14.4 % (ref 11.5–15.5)
WBC: 15.2 10*3/uL — AB (ref 4.0–10.5)

## 2015-11-08 LAB — GLUCOSE, CAPILLARY
GLUCOSE-CAPILLARY: 145 mg/dL — AB (ref 65–99)
Glucose-Capillary: 160 mg/dL — ABNORMAL HIGH (ref 65–99)
Glucose-Capillary: 241 mg/dL — ABNORMAL HIGH (ref 65–99)
Glucose-Capillary: 377 mg/dL — ABNORMAL HIGH (ref 65–99)

## 2015-11-08 LAB — URINALYSIS W MICROSCOPIC (NOT AT ARMC)
BILIRUBIN URINE: NEGATIVE
Glucose, UA: NEGATIVE mg/dL
Hgb urine dipstick: NEGATIVE
Ketones, ur: NEGATIVE mg/dL
LEUKOCYTES UA: NEGATIVE
Nitrite: NEGATIVE
PH: 5 (ref 5.0–8.0)
Protein, ur: 100 mg/dL — AB
SPECIFIC GRAVITY, URINE: 1.017 (ref 1.005–1.030)

## 2015-11-08 LAB — TSH: TSH: 0.391 u[IU]/mL (ref 0.350–4.500)

## 2015-11-08 LAB — TROPONIN I: Troponin I: 0.73 ng/mL (ref <0.031)

## 2015-11-08 LAB — VITAMIN B12: VITAMIN B 12: 272 pg/mL (ref 180–914)

## 2015-11-08 LAB — RETICULOCYTES
RBC.: 2.93 MIL/uL — AB (ref 3.87–5.11)
Retic Count, Absolute: 87.9 10*3/uL (ref 19.0–186.0)
Retic Ct Pct: 3 % (ref 0.4–3.1)

## 2015-11-08 LAB — MAGNESIUM: Magnesium: 1.4 mg/dL — ABNORMAL LOW (ref 1.7–2.4)

## 2015-11-08 LAB — OCCULT BLOOD X 1 CARD TO LAB, STOOL: Fecal Occult Bld: NEGATIVE

## 2015-11-08 LAB — FOLATE: FOLATE: 28.3 ng/mL (ref >5.9)

## 2015-11-08 LAB — IRON AND TIBC
IRON: 48 ug/dL (ref 28–170)
SATURATION RATIOS: 16 % (ref 10.4–31.8)
TIBC: 295 ug/dL (ref 250–450)
UIBC: 247 ug/dL

## 2015-11-08 LAB — FERRITIN: Ferritin: 49 ng/mL (ref 11–307)

## 2015-11-08 LAB — APTT: APTT: 36 s (ref 24–37)

## 2015-11-08 MED ORDER — MAGNESIUM SULFATE 2 GM/50ML IV SOLN
2.0000 g | Freq: Once | INTRAVENOUS | Status: AC
  Administered 2015-11-08: 2 g via INTRAVENOUS
  Filled 2015-11-08: qty 50

## 2015-11-08 MED ORDER — CEFTRIAXONE SODIUM 1 G IJ SOLR
1.0000 g | INTRAMUSCULAR | Status: DC
  Administered 2015-11-08 – 2015-11-09 (×2): 1 g via INTRAVENOUS
  Filled 2015-11-08 (×3): qty 10

## 2015-11-08 MED ORDER — POTASSIUM CHLORIDE CRYS ER 10 MEQ PO TBCR
10.0000 meq | EXTENDED_RELEASE_TABLET | Freq: Once | ORAL | Status: AC
  Administered 2015-11-08: 10 meq via ORAL
  Filled 2015-11-08: qty 1

## 2015-11-08 MED ORDER — POTASSIUM CHLORIDE CRYS ER 20 MEQ PO TBCR
40.0000 meq | EXTENDED_RELEASE_TABLET | Freq: Once | ORAL | Status: AC
  Administered 2015-11-08: 40 meq via ORAL
  Filled 2015-11-08: qty 2

## 2015-11-08 MED ORDER — IPRATROPIUM-ALBUTEROL 0.5-2.5 (3) MG/3ML IN SOLN
3.0000 mL | Freq: Four times a day (QID) | RESPIRATORY_TRACT | Status: DC
  Administered 2015-11-08 – 2015-11-09 (×4): 3 mL via RESPIRATORY_TRACT
  Filled 2015-11-08 (×5): qty 3

## 2015-11-08 MED ORDER — RIVAROXABAN 20 MG PO TABS
20.0000 mg | ORAL_TABLET | Freq: Every day | ORAL | Status: DC
  Administered 2015-11-08 – 2015-11-09 (×2): 20 mg via ORAL
  Filled 2015-11-08 (×2): qty 1

## 2015-11-08 MED ORDER — MAGNESIUM SULFATE 4 GM/100ML IV SOLN: 4.0000 g | Freq: Once | INTRAVENOUS | Status: DC

## 2015-11-08 NOTE — Progress Notes (Signed)
Anemia panel shows normal ferritin and iron level, hemacult negative. Likely anemia of chronic illness. Per IM note, was referred to GI doc by PCP, will need to continue followup with GI after discharge.   Discussed with Dr. [place] Silos, would prefer to discharge after afebrile for >24 hours, will keep today, and followup on urine culture tomorrow to decide outpatient abx therapy. Likely discharge tomorrow.   Hilbert Corrigan PA Pager: [phone]

## 2015-11-08 NOTE — Discharge Summary (Signed)
Discharge Summary   Patient ID: [name],  MRN: [id number], DOB/AGE: 1960/12/10 54 y.o.  Admit date: 11/07/2015 Discharge date: 11/09/2015  Primary Care Provider: OZD,GUY Primary Cardiologist: Dr. Gwenlyn Found  Discharge Diagnoses Principal Problem:   Fever, unspecified Active Problems:   Alcoholic cirrhosis ([name])   DM (diabetes mellitus), type 2 with peripheral vascular complications ([name])   Hypothyroidism   Chronic diastolic CHF (congestive heart failure) ([name])   COPD (chronic obstructive pulmonary disease) ([name])   Tobacco abuse   NSTEMI (non-ST elevated myocardial infarction) ([name])   Leukocytosis   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   Hypomagnesemia   Hypokalemia   Allergies Allergies  Allergen Reactions  . Amiodarone Nausea And Vomiting     Hospital Course  [name] 54 year old female with past medical history of HTN, DM, PAD (vasculopath) and chronic systolic heart failure with EF 40-45%. She has had multiple admissions in the recent months. She had lower extremity angiography by Dr. Gwenlyn Found and underwent right SFA direction arthrectomy in 2014. Her last cardiac catheterization was on 10/31/2014 which showed EF 30-35%, with severe hypokinesis in the anterior wall and apex, inferoapical apex, diffuse small vessel disease including small nondominant RCA and 80% small caliber OM1, 50% mid left circumflex stenosis. Her elevated troponin was felt to be related to atrial fibrillation with RVR and the underlying small vessel CAD. She was treated medically. Carotid Doppler showed high-grade bilateral ICA disease and a lower extremity Doppler revealed restenosis of the right SFA with ABI of 0.34. She had left carotid endarterectomy about 02/19/2015 by Dr. Trula Slade. Lower extremity Doppler study performed on 01/23/2015 showed ABI in the 0.6 range bilaterally was patent right SFA. She was admitted with atrial fibrillation with RVR in October and discharged on carvedilol. She was later readmitted was  atrial fibrillation in the setting of not taking her carvedilol. Her last admission was from 12/7-12/9 for chest pain, shortness breath, and atrial fibrillation with RVR.  She presented to [facility] in [place] on 11/07/2015 with left shoulder and the left jaw pain around 5 AM in the morning of presentation. She was visiting her family at the time. Initial troponin was elevated at 0.06. EKG showed T-wave inversion in the inferolateral leads. She was transferred to [facility] for further evaluation. She was admitted to cardiology service. Initial laboratory finding was significant for potassium 3.6, creatinine 1.26, alkaline phosphatase 245, troponin 0.46, white blood cell count 18.3, hemoglobin 9.5. Xarelto was held. She was started on IV heparin.however, her enzymes trend remained low level/flat, thus low likelihood for ACS. However, overnight, she ran a fever of 102.5. Urinalysis showed many bacteria, negative nitrite. She was given a dose of IV ceftriaxone. White blood cell count is trending down to 15.2. Hemoglobin 8.1. Fecal occult blood test was negative. Anemia panel shows normal ferritin and iron level suggesting anemia of chronic illness.  She was also found to have low normal B12 level, thus IM recommended 1000 mcg supplemental daily with f/u level in 1 month. It seems her PCP has referred her to GI at [facility] for evaluation, however colonoscopy was not able to be done due to transportation issues. Her IV heparin was discontinued and she was restarted on Xarelto. Her elevated troponin were felt to be demand ischemia in the setting of infection. She was having productive cough with yellow sputum. Given fever and elevated white blood cell count, internal medicine service was consulted. CXR was repeated which came back negative for acute process. She  was started on Rocephin for possible UTI- no other source found- no symptoms of UTI either. IM recommended 3 days of  antibiotics only, Ceftin 500 BID. She was monitored for an additional 24 hrs and remained afebrile. IM felt that she was stable for discharge. Dr. Gwenlyn Found also felt that she was stable for discharge home. A staff message was sent to our scheduler to arrange followup.    Discharge Vitals Blood pressure 135/89, pulse 87, temperature 97.5 F (36.4 C), temperature source Oral, resp. rate 20, height _0  (1.626 m), weight 246 lb 1.6 oz (111.63 kg), last menstrual period 11/27/2012, SpO2 96 %.  Filed Weights   11/07/15 1142 11/08/15 0501 11/09/15 0500  Weight: 238 lb 11.2 oz (108.274 kg) 241 lb 9.6 oz (109.589 kg) 246 lb 1.6 oz (111.63 kg)    Labs  CBC  Recent Labs  11/07/15 1509 11/08/15 0246 11/09/15 0240  WBC 18.3* 15.2* 11.8*  NEUTROABS 15.1*  --   --   HGB 9.5* 8.1* 8.1*  HCT 29.8* 25.9* 25.6*  MCV 90.9 92.8 91.1  PLT 298 340 161   Basic Metabolic Panel  Recent Labs  11/08/15 0246 11/09/15 0240  NA 139 132*  K 3.4* 4.1  CL 97* 94*  CO2 31 28  GLUCOSE 72 411*  BUN 30* 34*  CREATININE 1.52* 1.23*  CALCIUM 7.7* 7.8*  MG 1.4* 2.2   Liver Function Tests  Recent Labs  11/07/15 1509  AST 55*  ALT 59*  ALKPHOS 245*  BILITOT 0.6  PROT 6.8  ALBUMIN 2.9*   Cardiac Enzymes  Recent Labs  11/07/15 1509 11/07/15 2100 11/08/15 0246  TROPONINI 0.46* 0.61* 0.73*   Thyroid Function Tests  Recent Labs  11/07/15 2255  TSH 0.391    Disposition  Pt is being discharged home today in good condition.  Follow-up Plans & Appointments      Follow-up Information    Schedule an appointment as soon as possible for a visit with Sandi Mariscal, MD.   Specialty:  Internal Medicine   Why:  Please followup with your primary care physician as soon as possible after discharge.    Contact information:   [place] [address] [phone]       Please follow up.   Why:  Please followup with your GI doctor for evaluation of anemia as outpatient.       Follow up with Quay Burow, MD.   Specialties:  Cardiology, Radiology   Why:  Office scheduler will contact you to arrange followup, please give [place] a call if you do not hear from [place] in 2 business days.   Contact information:   [address] [place] [address] [phone]       Discharge Medications    Medication List    STOP taking these medications        traMADol 50 MG tablet  Commonly known as:  ULTRAM      TAKE these medications        ACCU-CHEK AVIVA PLUS W/DEVICE Kit  1 Device by Does not apply route 4 (four) times daily -  before meals and at bedtime.     accu-chek soft touch lancets  Use as instructed     acetaminophen 500 MG tablet  Commonly known as:  TYLENOL  Take 1,000 mg by mouth every 6 (six) hours as needed (pain).     albuterol (2.5 MG/3ML) 0.083% nebulizer solution  Commonly known as:  PROVENTIL  Take 3 mLs (  2.5 mg total) by nebulization every 6 (six) hours as needed. For wheezing.     albuterol 108 (90 BASE) MCG/ACT inhaler  Commonly known as:  PROVENTIL HFA;VENTOLIN HFA  Inhale 2 puffs into the lungs every 6 (six) hours as needed for wheezing or shortness of breath.     atorvastatin 40 MG tablet  Commonly known as:  LIPITOR  Take 1 tablet (40 mg total) by mouth daily.     carvedilol 12.5 MG tablet  Commonly known as:  COREG  Take 3 tablets (37.5 mg total) by mouth 2 (two) times daily with a meal.     cefUROXime 500 MG tablet  Commonly known as:  CEFTIN  Take 1 tablet (500 mg total) by mouth 2 (two) times daily with a meal.     clopidogrel 75 MG tablet  Commonly known as:  PLAVIX  Take 1 tablet (75 mg total) by mouth daily with breakfast.     cyanocobalamin 1000 MCG tablet  Take 1 tablet (1,000 mcg total) by mouth daily.  Start taking on:  11/10/2015     diltiazem 180 MG 24 hr capsule  Commonly known as:  CARDIZEM CD  Take 1 capsule (180 mg total) by mouth daily.     folic acid 1 MG tablet  Commonly known as:   FOLVITE  Take 1 tablet (1 mg total) by mouth daily.     furosemide 40 MG tablet  Commonly known as:  LASIX  Take 1 tablet (40 mg total) by mouth 2 (two) times daily.     glipiZIDE 10 MG tablet  Commonly known as:  GLUCOTROL  Take 20 mg by mouth daily with breakfast.     glipiZIDE 10 MG 24 hr tablet  Commonly known as:  GLUCOTROL XL  Take 2 tablets (20 mg total) by mouth daily with breakfast.     glucose blood test strip  Commonly known as:  ACCU-CHEK AVIVA PLUS  Use as instructed     GRALISE 300 MG Tabs  Generic drug:  Gabapentin (Once-Daily)  Take 300 mg by mouth 4 (four) times daily.     insulin aspart 100 UNIT/ML FlexPen  Commonly known as:  NOVOLOG  Sliding scale     insulin glargine 100 unit/mL Sopn  Commonly known as:  LANTUS  Inject 60 Units into the skin at bedtime.     Insulin Pen Needle 32G X 4 MM Misc  Commonly known as:  ULTICARE MICRO PEN NEEDLES  1 Syringe by Does not apply route 4 (four) times daily - after meals and at bedtime.     levothyroxine 50 MCG tablet  Commonly known as:  SYNTHROID, LEVOTHROID  Take 1 tablet (50 mcg total) by mouth daily.     lisinopril 20 MG tablet  Commonly known as:  PRINIVIL,ZESTRIL  Take 20 mg by mouth daily.     metFORMIN 500 MG tablet  Commonly known as:  GLUCOPHAGE  Take 500 mg by mouth 3 (three) times daily with meals.     nitroGLYCERIN 0.4 MG SL tablet  Commonly known as:  NITROSTAT  Place 1 tablet (0.4 mg total) under the tongue every 5 (five) minutes as needed for chest pain.     pantoprazole 40 MG tablet  Commonly known as:  PROTONIX  Take 1 tablet (40 mg total) by mouth daily.     rivaroxaban 20 MG Tabs tablet  Commonly known as:  XARELTO  Take 1 tablet (20 mg total) by mouth daily with supper.  Vitamin D 2000 UNITS tablet  Take 2,000 Units by mouth daily.        Duration of Discharge Encounter   Greater than 30 minutes including physician time.  Hilbert Corrigan PA-C Pager:  [phone] 11/08/2015, 6:43 PM   Lyda Jester, PA-C 11/09/2015

## 2015-11-08 NOTE — Consult Note (Signed)
[facility] Medical Consultation  [name] MBW:[id number] DOB: Mar 23, 1961 DOA: 11/07/2015 PCP: Karla Logan, MD   Requesting physician: [name] Date of consultation: 11/08/15 Reason for consultation: fever/anemia  Impression/Recommendations Principal Problem:   Fever, unspecified Active Problems:   Alcoholic cirrhosis ([place])   DM (diabetes mellitus), type 2 with peripheral vascular complications ([name])   Hypothyroidism   Chronic diastolic CHF (congestive heart failure) ([name])   COPD (chronic obstructive pulmonary disease) ([name])   Tobacco abuse   NSTEMI (non-ST elevated myocardial infarction) ([name])   Leukocytosis   Anemia   Hypomagnesemia   Hypokalemia  #1. Fever. May be related to urinary tract infection Patient spiked a temperature of 102.5 last evening. Await blood cultures. Urine  many bacteria but negative nitrites and leukocytes, chest x-ray no active disease.  -Continue Rocephin initiated 12/24  -Vital signs every 42 -Follow blood cultures and urine culture - Tylenol for temp greater than 101  #2. Anemia. Likely of chronic disease but hemoglobin trending down per chart review. patient does report she was scheduled for colonoscopy last month at the suggestion of her PCP and unable to keep the appointment due to transportation problems. She was referred to GI and [place] -We will obtain an anemia panel -FOBT -Consider GI consult -Consider transfusion if Hg less than 8 -Ocular CBC closely  #3. Diabetes type 2. Uncontrolled. -We'll obtain a hemoglobin A1c -Continuing home Lantus -Sliding scale insulin for optimal control  #4. Leukocytosis. Related to above. Trending down. -Await blood culture, await urine culture -Urine with many bacteria -Continue Rocephin -Monitor   5. COPD/tobacco use. Not on oxygen at home. Has not had a cigarette in 1 week. Home medications include albuterol inhaler -Breath sounds diminished, productive cough -Chest x-ray without  active disease -Nebs every 6 -Sedation counseling offered  #6. Hypokalemia. Mild. -We'll supplement -Recheck  #7. Hypomagnesemia -Will replete -Recheck   Will followup again tomorrow. Please contact me if I can be of assistance in the meanwhile. Thank you for this consultation.  Chief Complaint: fever/anemia  HPI: [name] daily 54 year old past medical history includes diabetes, chronic systolic heart failure, hypothyroidism, paroxysmal atrial fib on Xarelto, hypertension, CAD, transferred to [name] from [place] with chest pain and elevated troponin. Evaluated by cardiology who started heparin cycle enzymes and who are familiar with patient further note. However she spiked a temp 102.5 and a leukocytosis of 18. CBC this morning yields a hemoglobin declining and WBCs trending down as well. Blood cultures were obtained Rocephin was started.   Patient reports some dysuria intermittently over the last week. She denies frequency or urgency. She also reports some melena about a month ago when she was completing a prep for colonoscopy. She denies that her PCP has talked to her about anemia and is unsure why the colonoscopy was scheduled. She denies abdominal pain diarrhea bright red blood per rectum. She denies headache dizziness nausea vomiting decreased appetite unintentional weight loss.   Review of Systems:  10 point review of systems complete and all systems are negative except as indicated in the history of present illness  Past Medical History  Diagnosis Date  . Alcohol abuse   . Narcotic abuse   . Cocaine abuse   . Marijuana abuse   . Tobacco abuse   . Alcoholic cirrhosis ([name])   . Cardiomyopathy ([place])   . Obesity   . Chronic systolic CHF (congestive heart failure) ([place])     a. Last echo 12/205: EF 40-45%, inferoapical/posterior HK, not technically sufficient to  allow eval of LV diastolic function, normal LA in size..  . Hypothyroidism   . GERD (gastroesophageal reflux  disease)   . PAF (paroxysmal atrial fibrillation) ([place])   . Critical lower limb ischemia   . Hypertension   . CAD (coronary artery disease)     a. cath 11/10/2014 small vessel CAD. Demand ischemia in the setting of rapid a-fib  . Anxiety   . Poorly controlled type 2 diabetes mellitus ([place])   . Hyperlipemia   . Peripheral arterial disease ([place])     a. 01/2015 Angio/PTA: LSFA 100 w/ recon @ adductor canal and 1 vessel runoff via AT, RSFA 99 (atherectomy/pta) - 1 vessel runoff via diff dzs peroneal.  . Carotid artery disease ([place])     a. 01/2015 Carotid Angio: RICA 956, LICA 38V. s/p L carotid endarterectomy 02/2015.  [name] COPD (chronic obstructive pulmonary disease) ([place])   . Diabetic peripheral neuropathy [facility])    Past Surgical History  Procedure Laterality Date  . Cardioversion  ~ 02/2013    "twice"   . Lower extremity angiogram N/A 09/10/2013    Procedure: LOWER EXTREMITY ANGIOGRAM;  Surgeon: [name], MD;  Location: [facility] CATH LAB;  Service: Cardiovascular;  Laterality: N/A;  . Left heart catheterization with coronary angiogram N/A 10/31/2014    Procedure: LEFT HEART CATHETERIZATION WITH CORONARY ANGIOGRAM;  Surgeon: Burnell Blanks, MD;  Location: [facility] CATH LAB;  Service: Cardiovascular;  Laterality: N/A;  . Peripheral athrectomy Right 01/15/2015    SFA/notes 01/15/2015  . Balloon angioplasty, artery Right 01/15/2015    SFA/notes 01/15/2015  . Cardiac catheterization    . Lower extremity angiogram N/A 01/15/2015    Procedure: LOWER EXTREMITY ANGIOGRAM;  Surgeon: [name], MD;  Location: [facility] CATH LAB;  Service: Cardiovascular;  Laterality: N/A;  . Carotid angiogram N/A 01/15/2015    Procedure: CAROTID ANGIOGRAM;  Surgeon: [name], MD;  Location: [facility] CATH LAB;  Service: Cardiovascular;  Laterality: N/A;  . Endarterectomy Left 02/19/2015    Procedure: LEFT CAROTID ENDARTERECTOMY ;  Surgeon: Serafina Mitchell, MD;  Location: [facility] OR;  Service: Vascular;  Laterality: Left;  .  Dilation and curettage of uterus  1988   Social History:  reports that she has been smoking E-cigarettes.  She has been smoking about 0.00 packs per day for the past 40 years. She has never used smokeless tobacco. She reports that she does not drink alcohol or use illicit drugs. He said she stopped smoking about one week ago. She lives with her sister is fairly independent with ADLs but is on disability. Allergies  Allergen Reactions  . Amiodarone Nausea And Vomiting   Family History  Problem Relation Age of Onset  . Hypertension Mother   . Diabetes Mother   . Cancer Mother     breast, ovarian, colon  . Clotting disorder Mother   . Heart disease Mother   . Heart attack Mother   . Hypertension Father   . Heart disease Father   . Emphysema Sister     smoked    Prior to Admission medications   Medication Sig Start Date End Date Taking? Authorizing Provider  acetaminophen (TYLENOL) 500 MG tablet Take 1,000 mg by mouth every 6 (six) hours as needed (pain).   Yes Historical Provider, MD  albuterol (PROVENTIL HFA;VENTOLIN HFA) 108 (90 BASE) MCG/ACT inhaler Inhale 2 puffs into the lungs every 6 (six) hours as needed for wheezing or shortness of breath. 06/30/15  Yes Arnoldo Morale, MD  albuterol (  PROVENTIL) (2.5 MG/3ML) 0.083% nebulizer solution Take 3 mLs (2.5 mg total) by nebulization every 6 (six) hours as needed. For wheezing. Patient taking differently: Take 2.5 mg by nebulization every 6 (six) hours as needed.  06/30/15  Yes Arnoldo Morale, MD  atorvastatin (LIPITOR) 40 MG tablet Take 1 tablet (40 mg total) by mouth daily. 06/30/15  Yes Arnoldo Morale, MD  carvedilol (COREG) 12.5 MG tablet Take 3 tablets (37.5 mg total) by mouth 2 (two) times daily with a meal. 09/07/15  Yes Jessica U Vann, DO  carvedilol (COREG) 6.25 MG tablet Take 6.25 mg by mouth 2 (two) times daily with a meal.   Yes Historical Provider, MD  Cholecalciferol (VITAMIN D) 2000 UNITS tablet Take 2,000 Units by mouth daily.   Yes  Historical Provider, MD  clopidogrel (PLAVIX) 75 MG tablet Take 1 tablet (75 mg total) by mouth daily with breakfast. 06/30/15  Yes Arnoldo Morale, MD  diltiazem (CARDIZEM CD) 180 MG 24 hr capsule Take 1 capsule (180 mg total) by mouth daily. 10/23/15  Yes Eileen Stanford, PA-C  furosemide (LASIX) 40 MG tablet Take 1 tablet (40 mg total) by mouth 2 (two) times daily. Patient taking differently: Take 60 mg by mouth 2 (two) times daily.  09/07/15  Yes Geradine Girt, DO  Gabapentin, Once-Daily, (GRALISE) 300 MG TABS Take 300 mg by mouth 4 (four) times daily.    Yes Historical Provider, MD  glipiZIDE (GLUCOTROL) 10 MG tablet Take 20 mg by mouth daily with breakfast.   Yes Historical Provider, MD  insulin aspart (NOVOLOG) 100 UNIT/ML FlexPen Sliding scale Patient taking differently: Inject 8-14 Units into the skin 3 (three) times daily with meals. Per sliding scale 06/30/15  Yes Arnoldo Morale, MD  insulin glargine (LANTUS) 100 unit/mL SOPN Inject 60 Units into the skin at bedtime.   Yes Historical Provider, MD  levothyroxine (SYNTHROID, LEVOTHROID) 50 MCG tablet Take 1 tablet (50 mcg total) by mouth daily. 06/30/15  Yes Arnoldo Morale, MD  lisinopril (PRINIVIL,ZESTRIL) 20 MG tablet Take 20 mg by mouth daily.   Yes Historical Provider, MD  metFORMIN (GLUCOPHAGE) 500 MG tablet Take 500 mg by mouth 3 (three) times daily with meals.    Yes Historical Provider, MD  nitroGLYCERIN (NITROSTAT) 0.4 MG SL tablet Place 1 tablet (0.4 mg total) under the tongue every 5 (five) minutes as needed for chest pain. 01/16/15  Yes Rogelia Mire, NP  pantoprazole (PROTONIX) 40 MG tablet Take 1 tablet (40 mg total) by mouth daily. 09/22/15 09/21/16 Yes Arnoldo Morale, MD  rivaroxaban (XARELTO) 20 MG TABS tablet Take 1 tablet (20 mg total) by mouth daily with supper. Patient taking differently: Take 20 mg by mouth at bedtime.  06/30/15  Yes Arnoldo Morale, MD  Blood Glucose Monitoring Suppl (ACCU-CHEK AVIVA PLUS) W/DEVICE KIT 1 Device  by Does not apply route 4 (four) times daily -  before meals and at bedtime. 07/21/15   Arnoldo Morale, MD  glipiZIDE (GLUCOTROL XL) 10 MG 24 hr tablet Take 2 tablets (20 mg total) by mouth daily with breakfast. Patient not taking: Reported on 11/07/2015 06/30/15   Arnoldo Morale, MD  glucose blood (ACCU-CHEK AVIVA PLUS) test strip Use as instructed 07/21/15   Arnoldo Morale, MD  Insulin Pen Needle (ULTICARE MICRO PEN NEEDLES) 32G X 4 MM MISC 1 Syringe by Does not apply route 4 (four) times daily - after meals and at bedtime. 06/30/15   Arnoldo Morale, MD  Lancets (ACCU-CHEK SOFT TOUCH) lancets Use as instructed  07/21/15   Arnoldo Morale, MD  traMADol (ULTRAM) 50 MG tablet Take 1 tablet (50 mg total) by mouth every 12 (twelve) hours as needed for moderate pain. Patient not taking: Reported on 10/15/2015 09/07/15   Geradine Girt, DO   Physical Exam: Blood pressure 121/63, pulse 74, temperature 98.1 F (36.7 C), temperature source Oral, resp. rate 16, height 5' 4" (1.626 m), weight 109.589 kg (241 lb 9.6 oz), last menstrual period 11/27/2012, SpO2 96 %. Filed Vitals:   11/08/15 0533 11/08/15 1218  BP: 121/63   Pulse: 74   Temp:  98.1 F (36.7 C)  Resp:       General:  Up in chair obese cooperative appears comfortable  Eyes: Pupils equal round reactive to light EOMI  ENT: Is clear nose without drainage oropharynx without erythema or exudate  Neck: Range of motion no lymphadenopathy  Cardiovascular: Regular rate and rhythm, hear no murmur no gallop no rub or extremities with 1+ lower strep many edema  Respiratory: Normal effort. Breath sounds distant but clear. Moist productive cough during exam  Abdomen: Obese positive bowel sounds nontender to palpation  Skin: Dry no lesions or rashes  Musculoskeletal: Joints without swelling/erythema  Psychiatric: Calm cooperative  Neurologic: Oriented 3 speech clear facial symmetry  Labs on Admission:  Basic Metabolic Panel:  Recent Labs Lab  11/07/15 1509 11/08/15 0246  NA 138 139  K 3.6 3.4*  CL 95* 97*  CO2 33* 31  GLUCOSE 215* 72  BUN 24* 30*  CREATININE 1.26* 1.52*  CALCIUM 7.9* 7.7*  MG  --  1.4*   Liver Function Tests:  Recent Labs Lab 11/07/15 1509  AST 55*  ALT 59*  ALKPHOS 245*  BILITOT 0.6  PROT 6.8  ALBUMIN 2.9*   No results for input(s): LIPASE, AMYLASE in the last 168 hours. No results for input(s): AMMONIA in the last 168 hours. CBC:  Recent Labs Lab 11/07/15 1509 11/08/15 0246  WBC 18.3* 15.2*  NEUTROABS 15.1*  --   HGB 9.5* 8.1*  HCT 29.8* 25.9*  MCV 90.9 92.8  PLT 298 340   Cardiac Enzymes:  Recent Labs Lab 11/07/15 1509 11/07/15 2100 11/08/15 0246  TROPONINI 0.46* 0.61* 0.73*   BNP: Invalid input(s): POCBNP CBG:  Recent Labs Lab 11/07/15 1952 11/07/15 2333 11/08/15 0620 11/08/15 0732 11/08/15 1145  GLUCAP 227* 82 145* 160* 241*    Radiological Exams on Admission: Dg Chest Port 1 View  11/07/2015  CLINICAL DATA:  54 year old female with shortness of breath EXAM: PORTABLE CHEST 1 VIEW COMPARISON:  Chest radiograph dated 10/20/2015 FINDINGS: Single-view of the chest demonstrates minimal bibasilar dependent atelectatic changes. No focal consolidation, pleural effusion, pneumothorax. Stable cardiac silhouette. The osseous structures appear unremarkable. IMPRESSION: No active disease. Electronically Signed   By: Anner Crete M.D.   On: 11/07/2015 23:00    EKG: Independently reviewed sinus rhythm  Time spent: 55 minutes  [place]  If 7PM-7AM, please contact night-coverage [email or web address] Password [email or web address] 11/08/2015, 1:21 PM

## 2015-11-08 NOTE — Progress Notes (Addendum)
Eula Fried MD notified of UA results and 10 beat run VTACH (K 3.6, no Mg level this admission), VSS, pt asymptomatic, CP free. New order received for IV Abx, will continue to monitor closely.

## 2015-11-08 NOTE — Progress Notes (Signed)
Subjective:  No Cp/SOB  Objective:  Temp:  [98.6 F (37 C)-102.5 F (39.2 C)] 98.6 F (37 C) (12/25 0501) Pulse Rate:  [72-106] 74 (12/25 0533) Resp:  [16-21] 16 (12/25 0501) BP: (106-156)/(56-99) 121/63 mmHg (12/25 0533) SpO2:  [90 %-100 %] 96 % (12/25 0501) Weight:  [238 lb 11.2 oz (108.274 kg)-241 lb 9.6 oz (109.589 kg)] 241 lb 9.6 oz (109.589 kg) (12/25 0501) Weight change:   Intake/Output from previous day: 12/24 0701 - 12/25 0700 In: 1055 [P.O.:960; I.V.:45; IV Piggyback:50] Out: 1150 [Urine:1150]  Intake/Output from this shift:    Physical Exam: General appearance: alert and no distress Neck: no adenopathy, no carotid bruit, no JVD, supple, symmetrical, trachea midline and thyroid not enlarged, symmetric, no tenderness/mass/nodules Lungs: clear to auscultation bilaterally Heart: regular rate and rhythm, S1, S2 normal, no murmur, click, rub or gallop Extremities: extremities normal, atraumatic, no cyanosis or edema  Lab Results: Results for orders placed or performed during the hospital encounter of 11/07/15 (from the past 48 hour(s))  Glucose, capillary     Status: Abnormal   Collection Time: 11/07/15 11:47 AM  Result Value Ref Range   Glucose-Capillary 102 (H) 65 - 99 mg/dL   Comment 1 Notify RN    Comment 2 Document in Chart   Urine rapid drug screen (hosp performed)     Status: Abnormal   Collection Time: 11/07/15 12:22 PM  Result Value Ref Range   Opiates POSITIVE (A) NONE DETECTED   Cocaine NONE DETECTED NONE DETECTED   Benzodiazepines NONE DETECTED NONE DETECTED   Amphetamines NONE DETECTED NONE DETECTED   Tetrahydrocannabinol NONE DETECTED NONE DETECTED   Barbiturates NONE DETECTED NONE DETECTED    Comment:        DRUG SCREEN FOR MEDICAL PURPOSES ONLY.  IF CONFIRMATION IS NEEDED FOR ANY PURPOSE, NOTIFY LAB WITHIN 5 DAYS.        LOWEST DETECTABLE LIMITS FOR URINE DRUG SCREEN Drug Class       Cutoff (ng/mL) Amphetamine       1000 Barbiturate      200 Benzodiazepine   470 Tricyclics       962 Opiates          300 Cocaine          300 THC              50   MRSA PCR Screening     Status: None   Collection Time: 11/07/15  1:10 PM  Result Value Ref Range   MRSA by PCR NEGATIVE NEGATIVE    Comment:        The GeneXpert MRSA Assay (FDA approved for NASAL specimens only), is one component of a comprehensive MRSA colonization surveillance program. It is not intended to diagnose MRSA infection nor to guide or monitor treatment for MRSA infections.   Troponin I     Status: Abnormal   Collection Time: 11/07/15  3:09 PM  Result Value Ref Range   Troponin I 0.46 (H) <0.031 ng/mL    Comment:        PERSISTENTLY INCREASED TROPONIN VALUES IN THE RANGE OF 0.04-0.49 ng/mL CAN BE SEEN IN:       -UNSTABLE ANGINA       -CONGESTIVE HEART FAILURE       -MYOCARDITIS       -CHEST TRAUMA       -ARRYHTHMIAS       -LATE PRESENTING MYOCARDIAL INFARCTION       -COPD  CLINICAL FOLLOW-UP RECOMMENDED.   Comprehensive metabolic panel     Status: Abnormal   Collection Time: 11/07/15  3:09 PM  Result Value Ref Range   Sodium 138 135 - 145 mmol/L   Potassium 3.6 3.5 - 5.1 mmol/L   Chloride 95 (L) 101 - 111 mmol/L   CO2 33 (H) 22 - 32 mmol/L   Glucose, Bld 215 (H) 65 - 99 mg/dL   BUN 24 (H) 6 - 20 mg/dL   Creatinine, Ser 1.26 (H) 0.44 - 1.00 mg/dL   Calcium 7.9 (L) 8.9 - 10.3 mg/dL   Total Protein 6.8 6.5 - 8.1 g/dL   Albumin 2.9 (L) 3.5 - 5.0 g/dL   AST 55 (H) 15 - 41 U/L   ALT 59 (H) 14 - 54 U/L   Alkaline Phosphatase 245 (H) 38 - 126 U/L   Total Bilirubin 0.6 0.3 - 1.2 mg/dL   GFR calc non Af Amer 48 (L) >60 mL/min   GFR calc Af Amer 55 (L) >60 mL/min    Comment: (NOTE) The eGFR has been calculated using the CKD EPI equation. This calculation has not been validated in all clinical situations. eGFR's persistently <60 mL/min signify possible Chronic Kidney Disease.    Anion gap 10 5 - 15  CBC WITH  DIFFERENTIAL     Status: Abnormal   Collection Time: 11/07/15  3:09 PM  Result Value Ref Range   WBC 18.3 (H) 4.0 - 10.5 K/uL   RBC 3.28 (L) 3.87 - 5.11 MIL/uL   Hemoglobin 9.5 (L) 12.0 - 15.0 g/dL   HCT 29.8 (L) 36.0 - 46.0 %   MCV 90.9 78.0 - 100.0 fL   MCH 29.0 26.0 - 34.0 pg   MCHC 31.9 30.0 - 36.0 g/dL   RDW 14.1 11.5 - 15.5 %   Platelets 298 150 - 400 K/uL   Neutrophils Relative % 83 %   Neutro Abs 15.1 (H) 1.7 - 7.7 K/uL   Lymphocytes Relative 11 %   Lymphs Abs 2.1 0.7 - 4.0 K/uL   Monocytes Relative 4 %   Monocytes Absolute 0.8 0.1 - 1.0 K/uL   Eosinophils Relative 2 %   Eosinophils Absolute 0.4 0.0 - 0.7 K/uL   Basophils Relative 0 %   Basophils Absolute 0.0 0.0 - 0.1 K/uL  Protime-INR     Status: Abnormal   Collection Time: 11/07/15  3:09 PM  Result Value Ref Range   Prothrombin Time 15.4 (H) 11.6 - 15.2 seconds   INR 1.20 0.00 - 1.49  Glucose, capillary     Status: Abnormal   Collection Time: 11/07/15  4:37 PM  Result Value Ref Range   Glucose-Capillary 202 (H) 65 - 99 mg/dL   Comment 1 Notify RN    Comment 2 Document in Chart   Glucose, capillary     Status: Abnormal   Collection Time: 11/07/15  7:52 PM  Result Value Ref Range   Glucose-Capillary 227 (H) 65 - 99 mg/dL  Troponin I     Status: Abnormal   Collection Time: 11/07/15  9:00 PM  Result Value Ref Range   Troponin I 0.61 (HH) <0.031 ng/mL    Comment:        POSSIBLE MYOCARDIAL ISCHEMIA. SERIAL TESTING RECOMMENDED. CRITICAL RESULT CALLED TO, READ BACK BY AND VERIFIED WITH: WALKER J,RN 11/07/15 2154 WAYK   Heparin level (unfractionated)     Status: Abnormal   Collection Time: 11/07/15  9:00 PM  Result Value Ref Range   Heparin Unfractionated >  2.20 (H) 0.30 - 0.70 IU/mL    Comment: RESULTS CONFIRMED BY MANUAL DILUTION        IF HEPARIN RESULTS ARE BELOW EXPECTED VALUES, AND PATIENT DOSAGE HAS BEEN CONFIRMED, SUGGEST FOLLOW UP TESTING OF ANTITHROMBIN III LEVELS.   APTT     Status: Abnormal    Collection Time: 11/07/15  9:00 PM  Result Value Ref Range   aPTT 44 (H) 24 - 37 seconds    Comment:        IF BASELINE aPTT IS ELEVATED, SUGGEST PATIENT RISK ASSESSMENT BE USED TO DETERMINE APPROPRIATE ANTICOAGULANT THERAPY.   Blood gas, arterial     Status: Abnormal   Collection Time: 11/07/15 10:26 PM  Result Value Ref Range   O2 Content 2.0 L/min   Delivery systems NASAL CANNULA    pH, Arterial 7.421 7.350 - 7.450   pCO2 arterial 51.0 (H) 35.0 - 45.0 mmHg   pO2, Arterial 80.3 80.0 - 100.0 mmHg   Bicarbonate 32.5 (H) 20.0 - 24.0 mEq/L   TCO2 34.1 0 - 100 mmol/L   Acid-Base Excess 7.9 (H) 0.0 - 2.0 mmol/L   O2 Saturation 95.6 %   Patient temperature 98.6    Collection site LEFT RADIAL    Drawn by [id number]    Sample type ARTERIAL DRAW    Allens test (pass/fail) PASS PASS  TSH     Status: None   Collection Time: 11/07/15 10:55 PM  Result Value Ref Range   TSH 0.391 0.350 - 4.500 uIU/mL  Glucose, capillary     Status: None   Collection Time: 11/07/15 11:33 PM  Result Value Ref Range   Glucose-Capillary 82 65 - 99 mg/dL  Urinalysis with microscopic (not at [facility])     Status: Abnormal   Collection Time: 11/07/15 11:53 PM  Result Value Ref Range   Color, Urine YELLOW YELLOW   APPearance CLEAR CLEAR   Specific Gravity, Urine 1.017 1.005 - 1.030   pH 5.0 5.0 - 8.0   Glucose, UA NEGATIVE NEGATIVE mg/dL   Hgb urine dipstick NEGATIVE NEGATIVE   Bilirubin Urine NEGATIVE NEGATIVE   Ketones, ur NEGATIVE NEGATIVE mg/dL   Protein, ur 100 (A) NEGATIVE mg/dL   Nitrite NEGATIVE NEGATIVE   Leukocytes, UA NEGATIVE NEGATIVE   WBC, UA 0-5 0 - 5 WBC/hpf   RBC / HPF 0-5 0 - 5 RBC/hpf   Bacteria, UA MANY (A) NONE SEEN   Squamous Epithelial / LPF 6-30 (A) NONE SEEN   Casts HYALINE CASTS (A) NEGATIVE    Comment: GRANULAR CAST  Basic metabolic panel     Status: Abnormal   Collection Time: 11/08/15  2:46 AM  Result Value Ref Range   Sodium 139 135 - 145 mmol/L   Potassium 3.4 (L) 3.5 - 5.1  mmol/L   Chloride 97 (L) 101 - 111 mmol/L   CO2 31 22 - 32 mmol/L   Glucose, Bld 72 65 - 99 mg/dL   BUN 30 (H) 6 - 20 mg/dL   Creatinine, Ser 1.52 (H) 0.44 - 1.00 mg/dL   Calcium 7.7 (L) 8.9 - 10.3 mg/dL   GFR calc non Af Amer 38 (L) >60 mL/min   GFR calc Af Amer 44 (L) >60 mL/min    Comment: (NOTE) The eGFR has been calculated using the CKD EPI equation. This calculation has not been validated in all clinical situations. eGFR's persistently <60 mL/min signify possible Chronic Kidney Disease.    Anion gap 11 5 - 15  CBC  Status: Abnormal   Collection Time: 11/08/15  2:46 AM  Result Value Ref Range   WBC 15.2 (H) 4.0 - 10.5 K/uL   RBC 2.79 (L) 3.87 - 5.11 MIL/uL   Hemoglobin 8.1 (L) 12.0 - 15.0 g/dL   HCT 25.9 (L) 36.0 - 46.0 %   MCV 92.8 78.0 - 100.0 fL   MCH 29.0 26.0 - 34.0 pg   MCHC 31.3 30.0 - 36.0 g/dL   RDW 14.4 11.5 - 15.5 %   Platelets 340 150 - 400 K/uL  APTT     Status: None   Collection Time: 11/08/15  2:46 AM  Result Value Ref Range   aPTT 36 24 - 37 seconds  Magnesium     Status: Abnormal   Collection Time: 11/08/15  2:46 AM  Result Value Ref Range   Magnesium 1.4 (L) 1.7 - 2.4 mg/dL  Troponin I     Status: Abnormal   Collection Time: 11/08/15  2:46 AM  Result Value Ref Range   Troponin I 0.73 (HH) <0.031 ng/mL    Comment:        POSSIBLE MYOCARDIAL ISCHEMIA. SERIAL TESTING RECOMMENDED. CRITICAL VALUE NOTED.  VALUE IS CONSISTENT WITH PREVIOUSLY REPORTED AND CALLED VALUE.   Glucose, capillary     Status: Abnormal   Collection Time: 11/08/15  6:20 AM  Result Value Ref Range   Glucose-Capillary 145 (H) 65 - 99 mg/dL  Glucose, capillary     Status: Abnormal   Collection Time: 11/08/15  7:32 AM  Result Value Ref Range   Glucose-Capillary 160 (H) 65 - 99 mg/dL    Imaging: Imaging results have been reviewed NAD  Tele: NSR  Assessment/Plan:   1. Active Problems: 2.   NSTEMI (non-ST elevated myocardial infarction) ([place]) 3.   Time Spent  Directly with Patient:  20 minutes  Length of Stay:   No complaints of CP/SOB. Enz curve flat. Exam benign. Was c/o cough productive of yellowish sputum. T 102 and WBCs elevated. Prob bronchitis. OK for DC from cards view point. Will get TRH to see this AM to eval infection issue.  [name] 11/08/2015, 11:18 AM

## 2015-11-09 ENCOUNTER — Ambulatory Visit (HOSPITAL_COMMUNITY): Payer: Medicare Other

## 2015-11-09 DIAGNOSIS — E1151 Type 2 diabetes mellitus with diabetic peripheral angiopathy without gangrene: Secondary | ICD-10-CM

## 2015-11-09 DIAGNOSIS — I5032 Chronic diastolic (congestive) heart failure: Secondary | ICD-10-CM

## 2015-11-09 DIAGNOSIS — R509 Fever, unspecified: Secondary | ICD-10-CM

## 2015-11-09 DIAGNOSIS — D513 Other dietary vitamin B12 deficiency anemia: Secondary | ICD-10-CM

## 2015-11-09 LAB — BASIC METABOLIC PANEL
ANION GAP: 10 (ref 5–15)
BUN: 34 mg/dL — ABNORMAL HIGH (ref 6–20)
CALCIUM: 7.8 mg/dL — AB (ref 8.9–10.3)
CO2: 28 mmol/L (ref 22–32)
Chloride: 94 mmol/L — ABNORMAL LOW (ref 101–111)
Creatinine, Ser: 1.23 mg/dL — ABNORMAL HIGH (ref 0.44–1.00)
GFR, EST AFRICAN AMERICAN: 57 mL/min — AB (ref >60)
GFR, EST NON AFRICAN AMERICAN: 49 mL/min — AB (ref >60)
Glucose, Bld: 411 mg/dL — ABNORMAL HIGH (ref 65–99)
Potassium: 4.1 mmol/L (ref 3.5–5.1)
Sodium: 132 mmol/L — ABNORMAL LOW (ref 135–145)

## 2015-11-09 LAB — CBC
HCT: 25.6 % — ABNORMAL LOW (ref 36.0–46.0)
HEMOGLOBIN: 8.1 g/dL — AB (ref 12.0–15.0)
MCH: 28.8 pg (ref 26.0–34.0)
MCHC: 31.6 g/dL (ref 30.0–36.0)
MCV: 91.1 fL (ref 78.0–100.0)
PLATELETS: 296 10*3/uL (ref 150–400)
RBC: 2.81 MIL/uL — AB (ref 3.87–5.11)
RDW: 14.2 % (ref 11.5–15.5)
WBC: 11.8 10*3/uL — ABNORMAL HIGH (ref 4.0–10.5)

## 2015-11-09 LAB — GLUCOSE, CAPILLARY
GLUCOSE-CAPILLARY: 305 mg/dL — AB (ref 65–99)
GLUCOSE-CAPILLARY: 317 mg/dL — AB (ref 65–99)
Glucose-Capillary: 398 mg/dL — ABNORMAL HIGH (ref 65–99)
Glucose-Capillary: 245 mg/dL — ABNORMAL HIGH (ref 65–99)

## 2015-11-09 LAB — MAGNESIUM: MAGNESIUM: 2.2 mg/dL (ref 1.7–2.4)

## 2015-11-09 MED ORDER — VITAMIN B-12 1000 MCG PO TABS: 1000.0000 ug | ORAL_TABLET | Freq: Every day | ORAL | Status: DC

## 2015-11-09 MED ORDER — VITAMIN B-12 1000 MCG PO TABS
1000.0000 ug | ORAL_TABLET | Freq: Once | ORAL | Status: AC
  Administered 2015-11-09: 1000 ug via ORAL
  Filled 2015-11-09: qty 1

## 2015-11-09 MED ORDER — IPRATROPIUM-ALBUTEROL 0.5-2.5 (3) MG/3ML IN SOLN: 3.0000 mL | Freq: Four times a day (QID) | RESPIRATORY_TRACT | Status: DC | PRN

## 2015-11-09 MED ORDER — CYANOCOBALAMIN 1000 MCG PO TABS: 1000.0000 ug | ORAL_TABLET | Freq: Every day | ORAL | Status: DC

## 2015-11-09 MED ORDER — ALPRAZOLAM 0.25 MG PO TABS
0.2500 mg | ORAL_TABLET | Freq: Once | ORAL | Status: AC
  Administered 2015-11-09: 0.25 mg via ORAL
  Filled 2015-11-09: qty 1

## 2015-11-09 MED ORDER — CEFUROXIME AXETIL 500 MG PO TABS: 500.0000 mg | ORAL_TABLET | Freq: Two times a day (BID) | ORAL | Status: DC

## 2015-11-09 MED ORDER — FOLIC ACID 1 MG PO TABS: 1.0000 mg | ORAL_TABLET | Freq: Every day | ORAL | Status: DC

## 2015-11-09 NOTE — Progress Notes (Signed)
Subjective:  No further CP. Afeb. Hgb slightly decreased 8.1  Objective:  Temp:  [98.1 F (36.7 C)-98.5 F (36.9 C)] 98.1 F (36.7 C) (12/26 0500) Pulse Rate:  [64-80] 67 (12/26 0500) Resp:  [15-16] 16 (12/26 0500) BP: (94-137)/(55-67) 117/55 mmHg (12/26 0500) SpO2:  [87 %-98 %] 98 % (12/26 0500) Weight:  [246 lb 1.6 oz (111.63 kg)] 246 lb 1.6 oz (111.63 kg) (12/26 0500) Weight change: 7 lb 6.4 oz (3.357 kg)  Intake/Output from previous day: 12/25 0701 - 12/26 0700 In: 1350 [P.O.:1200; IV Piggyback:150] Out: 1000 [Urine:1000]  Intake/Output from this shift:    Physical Exam: General appearance: alert and no distress Neck: no adenopathy, no carotid bruit, no JVD, supple, symmetrical, trachea midline and thyroid not enlarged, symmetric, no tenderness/mass/nodules Lungs: clear to auscultation bilaterally Heart: regular rate and rhythm, S1, S2 normal, no murmur, click, rub or gallop Extremities: extremities normal, atraumatic, no cyanosis or edema  Lab Results: Results for orders placed or performed during the hospital encounter of 11/07/15 (from the past 48 hour(s))  Glucose, capillary     Status: Abnormal   Collection Time: 11/07/15 11:47 AM  Result Value Ref Range   Glucose-Capillary 102 (H) 65 - 99 mg/dL   Comment 1 Notify RN    Comment 2 Document in Chart   Urine rapid drug screen (hosp performed)     Status: Abnormal   Collection Time: 11/07/15 12:22 PM  Result Value Ref Range   Opiates POSITIVE (A) NONE DETECTED   Cocaine NONE DETECTED NONE DETECTED   Benzodiazepines NONE DETECTED NONE DETECTED   Amphetamines NONE DETECTED NONE DETECTED   Tetrahydrocannabinol NONE DETECTED NONE DETECTED   Barbiturates NONE DETECTED NONE DETECTED    Comment:        DRUG SCREEN FOR MEDICAL PURPOSES ONLY.  IF CONFIRMATION IS NEEDED FOR ANY PURPOSE, NOTIFY LAB WITHIN 5 DAYS.        LOWEST DETECTABLE LIMITS FOR URINE DRUG SCREEN Drug Class       Cutoff  (ng/mL) Amphetamine      1000 Barbiturate      200 Benzodiazepine   542 Tricyclics       706 Opiates          300 Cocaine          300 THC              50   MRSA PCR Screening     Status: None   Collection Time: 11/07/15  1:10 PM  Result Value Ref Range   MRSA by PCR NEGATIVE NEGATIVE    Comment:        The GeneXpert MRSA Assay (FDA approved for NASAL specimens only), is one component of a comprehensive MRSA colonization surveillance program. It is not intended to diagnose MRSA infection nor to guide or monitor treatment for MRSA infections.   Troponin I     Status: Abnormal   Collection Time: 11/07/15  3:09 PM  Result Value Ref Range   Troponin I 0.46 (H) <0.031 ng/mL    Comment:        PERSISTENTLY INCREASED TROPONIN VALUES IN THE RANGE OF 0.04-0.49 ng/mL CAN BE SEEN IN:       -UNSTABLE ANGINA       -CONGESTIVE HEART FAILURE       -MYOCARDITIS       -CHEST TRAUMA       -ARRYHTHMIAS       -LATE PRESENTING MYOCARDIAL INFARCTION       -  COPD   CLINICAL FOLLOW-UP RECOMMENDED.   Comprehensive metabolic panel     Status: Abnormal   Collection Time: 11/07/15  3:09 PM  Result Value Ref Range   Sodium 138 135 - 145 mmol/L   Potassium 3.6 3.5 - 5.1 mmol/L   Chloride 95 (L) 101 - 111 mmol/L   CO2 33 (H) 22 - 32 mmol/L   Glucose, Bld 215 (H) 65 - 99 mg/dL   BUN 24 (H) 6 - 20 mg/dL   Creatinine, Ser 1.26 (H) 0.44 - 1.00 mg/dL   Calcium 7.9 (L) 8.9 - 10.3 mg/dL   Total Protein 6.8 6.5 - 8.1 g/dL   Albumin 2.9 (L) 3.5 - 5.0 g/dL   AST 55 (H) 15 - 41 U/L   ALT 59 (H) 14 - 54 U/L   Alkaline Phosphatase 245 (H) 38 - 126 U/L   Total Bilirubin 0.6 0.3 - 1.2 mg/dL   GFR calc non Af Amer 48 (L) >60 mL/min   GFR calc Af Amer 55 (L) >60 mL/min    Comment: (NOTE) The eGFR has been calculated using the CKD EPI equation. This calculation has not been validated in all clinical situations. eGFR's persistently <60 mL/min signify possible Chronic Kidney Disease.    Anion gap 10 5  - 15  CBC WITH DIFFERENTIAL     Status: Abnormal   Collection Time: 11/07/15  3:09 PM  Result Value Ref Range   WBC 18.3 (H) 4.0 - 10.5 K/uL   RBC 3.28 (L) 3.87 - 5.11 MIL/uL   Hemoglobin 9.5 (L) 12.0 - 15.0 g/dL   HCT 29.8 (L) 36.0 - 46.0 %   MCV 90.9 78.0 - 100.0 fL   MCH 29.0 26.0 - 34.0 pg   MCHC 31.9 30.0 - 36.0 g/dL   RDW 14.1 11.5 - 15.5 %   Platelets 298 150 - 400 K/uL   Neutrophils Relative % 83 %   Neutro Abs 15.1 (H) 1.7 - 7.7 K/uL   Lymphocytes Relative 11 %   Lymphs Abs 2.1 0.7 - 4.0 K/uL   Monocytes Relative 4 %   Monocytes Absolute 0.8 0.1 - 1.0 K/uL   Eosinophils Relative 2 %   Eosinophils Absolute 0.4 0.0 - 0.7 K/uL   Basophils Relative 0 %   Basophils Absolute 0.0 0.0 - 0.1 K/uL  Protime-INR     Status: Abnormal   Collection Time: 11/07/15  3:09 PM  Result Value Ref Range   Prothrombin Time 15.4 (H) 11.6 - 15.2 seconds   INR 1.20 0.00 - 1.49  Glucose, capillary     Status: Abnormal   Collection Time: 11/07/15  4:37 PM  Result Value Ref Range   Glucose-Capillary 202 (H) 65 - 99 mg/dL   Comment 1 Notify RN    Comment 2 Document in Chart   Glucose, capillary     Status: Abnormal   Collection Time: 11/07/15  7:52 PM  Result Value Ref Range   Glucose-Capillary 227 (H) 65 - 99 mg/dL  Troponin I     Status: Abnormal   Collection Time: 11/07/15  9:00 PM  Result Value Ref Range   Troponin I 0.61 (HH) <0.031 ng/mL    Comment:        POSSIBLE MYOCARDIAL ISCHEMIA. SERIAL TESTING RECOMMENDED. CRITICAL RESULT CALLED TO, READ BACK BY AND VERIFIED WITH: WALKER J,RN 11/07/15 2154 WAYK   Heparin level (unfractionated)     Status: Abnormal   Collection Time: 11/07/15  9:00 PM  Result Value Ref Range  Heparin Unfractionated >2.20 (H) 0.30 - 0.70 IU/mL    Comment: RESULTS CONFIRMED BY MANUAL DILUTION        IF HEPARIN RESULTS ARE BELOW EXPECTED VALUES, AND PATIENT DOSAGE HAS BEEN CONFIRMED, SUGGEST FOLLOW UP TESTING OF ANTITHROMBIN III LEVELS.   APTT      Status: Abnormal   Collection Time: 11/07/15  9:00 PM  Result Value Ref Range   aPTT 44 (H) 24 - 37 seconds    Comment:        IF BASELINE aPTT IS ELEVATED, SUGGEST PATIENT RISK ASSESSMENT BE USED TO DETERMINE APPROPRIATE ANTICOAGULANT THERAPY.   Blood gas, arterial     Status: Abnormal   Collection Time: 11/07/15 10:26 PM  Result Value Ref Range   O2 Content 2.0 L/min   Delivery systems NASAL CANNULA    pH, Arterial 7.421 7.350 - 7.450   pCO2 arterial 51.0 (H) 35.0 - 45.0 mmHg   pO2, Arterial 80.3 80.0 - 100.0 mmHg   Bicarbonate 32.5 (H) 20.0 - 24.0 mEq/L   TCO2 34.1 0 - 100 mmol/L   Acid-Base Excess 7.9 (H) 0.0 - 2.0 mmol/L   O2 Saturation 95.6 %   Patient temperature 98.6    Collection site LEFT RADIAL    Drawn by [id number]    Sample type ARTERIAL DRAW    Allens test (pass/fail) PASS PASS  TSH     Status: None   Collection Time: 11/07/15 10:55 PM  Result Value Ref Range   TSH 0.391 0.350 - 4.500 uIU/mL  Glucose, capillary     Status: None   Collection Time: 11/07/15 11:33 PM  Result Value Ref Range   Glucose-Capillary 82 65 - 99 mg/dL  Urinalysis with microscopic (not at [facility])     Status: Abnormal   Collection Time: 11/07/15 11:53 PM  Result Value Ref Range   Color, Urine YELLOW YELLOW   APPearance CLEAR CLEAR   Specific Gravity, Urine 1.017 1.005 - 1.030   pH 5.0 5.0 - 8.0   Glucose, UA NEGATIVE NEGATIVE mg/dL   Hgb urine dipstick NEGATIVE NEGATIVE   Bilirubin Urine NEGATIVE NEGATIVE   Ketones, ur NEGATIVE NEGATIVE mg/dL   Protein, ur 100 (A) NEGATIVE mg/dL   Nitrite NEGATIVE NEGATIVE   Leukocytes, UA NEGATIVE NEGATIVE   WBC, UA 0-5 0 - 5 WBC/hpf   RBC / HPF 0-5 0 - 5 RBC/hpf   Bacteria, UA MANY (A) NONE SEEN   Squamous Epithelial / LPF 6-30 (A) NONE SEEN   Casts HYALINE CASTS (A) NEGATIVE    Comment: GRANULAR CAST  Basic metabolic panel     Status: Abnormal   Collection Time: 11/08/15  2:46 AM  Result Value Ref Range   Sodium 139 135 - 145 mmol/L    Potassium 3.4 (L) 3.5 - 5.1 mmol/L   Chloride 97 (L) 101 - 111 mmol/L   CO2 31 22 - 32 mmol/L   Glucose, Bld 72 65 - 99 mg/dL   BUN 30 (H) 6 - 20 mg/dL   Creatinine, Ser 1.52 (H) 0.44 - 1.00 mg/dL   Calcium 7.7 (L) 8.9 - 10.3 mg/dL   GFR calc non Af Amer 38 (L) >60 mL/min   GFR calc Af Amer 44 (L) >60 mL/min    Comment: (NOTE) The eGFR has been calculated using the CKD EPI equation. This calculation has not been validated in all clinical situations. eGFR's persistently <60 mL/min signify possible Chronic Kidney Disease.    Anion gap 11 5 - 15  CBC  Status: Abnormal   Collection Time: 11/08/15  2:46 AM  Result Value Ref Range   WBC 15.2 (H) 4.0 - 10.5 K/uL   RBC 2.79 (L) 3.87 - 5.11 MIL/uL   Hemoglobin 8.1 (L) 12.0 - 15.0 g/dL   HCT 25.9 (L) 36.0 - 46.0 %   MCV 92.8 78.0 - 100.0 fL   MCH 29.0 26.0 - 34.0 pg   MCHC 31.3 30.0 - 36.0 g/dL   RDW 14.4 11.5 - 15.5 %   Platelets 340 150 - 400 K/uL  APTT     Status: None   Collection Time: 11/08/15  2:46 AM  Result Value Ref Range   aPTT 36 24 - 37 seconds  Magnesium     Status: Abnormal   Collection Time: 11/08/15  2:46 AM  Result Value Ref Range   Magnesium 1.4 (L) 1.7 - 2.4 mg/dL  Troponin I     Status: Abnormal   Collection Time: 11/08/15  2:46 AM  Result Value Ref Range   Troponin I 0.73 (HH) <0.031 ng/mL    Comment:        POSSIBLE MYOCARDIAL ISCHEMIA. SERIAL TESTING RECOMMENDED. CRITICAL VALUE NOTED.  VALUE IS CONSISTENT WITH PREVIOUSLY REPORTED AND CALLED VALUE.   Glucose, capillary     Status: Abnormal   Collection Time: 11/08/15  6:20 AM  Result Value Ref Range   Glucose-Capillary 145 (H) 65 - 99 mg/dL  Glucose, capillary     Status: Abnormal   Collection Time: 11/08/15  7:32 AM  Result Value Ref Range   Glucose-Capillary 160 (H) 65 - 99 mg/dL  Glucose, capillary     Status: Abnormal   Collection Time: 11/08/15 11:45 AM  Result Value Ref Range   Glucose-Capillary 241 (H) 65 - 99 mg/dL  Occult blood card  to lab, stool RN will collect     Status: None   Collection Time: 11/08/15  1:09 PM  Result Value Ref Range   Fecal Occult Bld NEGATIVE NEGATIVE  Vitamin B12     Status: None   Collection Time: 11/08/15  2:12 PM  Result Value Ref Range   Vitamin B-12 272 180 - 914 pg/mL    Comment: (NOTE) This assay is not validated for testing neonatal or myeloproliferative syndrome specimens for Vitamin B12 levels.   Folate     Status: None   Collection Time: 11/08/15  2:12 PM  Result Value Ref Range   Folate 28.3 >5.9 ng/mL  Iron and TIBC     Status: None   Collection Time: 11/08/15  2:12 PM  Result Value Ref Range   Iron 48 28 - 170 ug/dL   TIBC 295 250 - 450 ug/dL   Saturation Ratios 16 10.4 - 31.8 %   UIBC 247 ug/dL  Ferritin     Status: None   Collection Time: 11/08/15  2:12 PM  Result Value Ref Range   Ferritin 49 11 - 307 ng/mL  Reticulocytes     Status: Abnormal   Collection Time: 11/08/15  2:12 PM  Result Value Ref Range   Retic Ct Pct 3.0 0.4 - 3.1 %   RBC. 2.93 (L) 3.87 - 5.11 MIL/uL   Retic Count, Manual 87.9 19.0 - 186.0 K/uL  Glucose, capillary     Status: Abnormal   Collection Time: 11/08/15  4:33 PM  Result Value Ref Range   Glucose-Capillary 377 (H) 65 - 99 mg/dL  Glucose, capillary     Status: Abnormal   Collection Time: 11/08/15  9:36 PM  Result Value Ref Range   Glucose-Capillary 398 (H) 65 - 99 mg/dL  CBC     Status: Abnormal   Collection Time: 11/09/15  2:40 AM  Result Value Ref Range   WBC 11.8 (H) 4.0 - 10.5 K/uL   RBC 2.81 (L) 3.87 - 5.11 MIL/uL   Hemoglobin 8.1 (L) 12.0 - 15.0 g/dL   HCT 25.6 (L) 36.0 - 46.0 %   MCV 91.1 78.0 - 100.0 fL   MCH 28.8 26.0 - 34.0 pg   MCHC 31.6 30.0 - 36.0 g/dL   RDW 14.2 11.5 - 15.5 %   Platelets 296 150 - 400 K/uL  Magnesium     Status: None   Collection Time: 11/09/15  2:40 AM  Result Value Ref Range   Magnesium 2.2 1.7 - 2.4 mg/dL  Basic metabolic panel     Status: Abnormal   Collection Time: 11/09/15  2:40 AM   Result Value Ref Range   Sodium 132 (L) 135 - 145 mmol/L    Comment: DELTA CHECK NOTED   Potassium 4.1 3.5 - 5.1 mmol/L    Comment: DELTA CHECK NOTED   Chloride 94 (L) 101 - 111 mmol/L   CO2 28 22 - 32 mmol/L   Glucose, Bld 411 (H) 65 - 99 mg/dL   BUN 34 (H) 6 - 20 mg/dL   Creatinine, Ser 1.23 (H) 0.44 - 1.00 mg/dL   Calcium 7.8 (L) 8.9 - 10.3 mg/dL   GFR calc non Af Amer 49 (L) >60 mL/min   GFR calc Af Amer 57 (L) >60 mL/min    Comment: (NOTE) The eGFR has been calculated using the CKD EPI equation. This calculation has not been validated in all clinical situations. eGFR's persistently <60 mL/min signify possible Chronic Kidney Disease.    Anion gap 10 5 - 15    Imaging: Imaging results have been reviewed  Tele- NSR  Assessment/Plan:   1. Principal Problem: 2.   Fever, unspecified 3. Active Problems: 4.   Alcoholic cirrhosis ([place]) 5.   DM (diabetes mellitus), type 2 with peripheral vascular complications ([place]) 6.   Hypothyroidism 7.   Chronic diastolic CHF (congestive heart failure) ([place]) 8.   COPD (chronic obstructive pulmonary disease) ([place]) 9.   Tobacco abuse 10.   NSTEMI (non-ST elevated myocardial infarction) ([place]) 11.   Leukocytosis 12.   Anemia 13.   Hypomagnesemia 14.   Hypokalemia 15.   Time Spent Directly with Patient:  15 minutes  Length of Stay:  LOS: 1 day   No further CP. Afeb. Hgb 8.1 (down from 9.5). Etiology of anemia not clear. If FOBT neg and iron studies neg prob OK to DC home on PPI and Fe replacement for OP GI eval. Would check CBC in 1 week as OP. Will follow up in office 1 month.  Quay Burow 11/09/2015, 7:26 AM

## 2015-11-09 NOTE — Progress Notes (Signed)
Mrs Gomolka was consulted on yesterday by my partner for fever and anemia.   1. Fever: No fever in > 24 hrs now. Was started on Rocephin for possible UTI- no other source found- no symptoms of UTI either- recommend 3 days of antibiotics only- if she leaves today, can change Rocephin to Ceftin 50 BID- tomorrow would be last dose  2. Anemia: - work up included an FOBT (negative) and an anemia panel which reveals a low normal B12 which we can start replacing- will order 1000 mcg s/c once and then can continue 1000 mcg oral daily - repeat B 12 level in 1 month  3. DM 2, uncontrolled -  Eating fruit and drinking juice in the hospital - tells me that her endocrinologist is "working hard" on her sugars- I will not change her meds but recommend she see her endocrinologist for follow up- re-iterated dietary compliance to her   Debbe Odea, MD

## 2015-11-10 LAB — URINE CULTURE: Culture: 30000

## 2015-11-11 ENCOUNTER — Ambulatory Visit (HOSPITAL_COMMUNITY): Payer: Medicaid Other

## 2015-11-11 ENCOUNTER — Encounter (HOSPITAL_COMMUNITY): Payer: Medicare Other

## 2015-11-13 ENCOUNTER — Encounter (HOSPITAL_COMMUNITY): Payer: Medicare Other

## 2015-11-13 ENCOUNTER — Ambulatory Visit (HOSPITAL_COMMUNITY): Payer: Medicaid Other

## 2015-11-13 LAB — CULTURE, BLOOD (ROUTINE X 2)
Culture: NO GROWTH
Culture: NO GROWTH

## 2015-11-16 ENCOUNTER — Ambulatory Visit (HOSPITAL_COMMUNITY): Payer: Medicaid Other

## 2015-11-16 ENCOUNTER — Encounter: Payer: Self-pay | Admitting: Physician Assistant

## 2015-11-16 NOTE — Progress Notes (Signed)
Cardiology Office Note Date:  11/17/2015  Patient ID:  [name], [name] 07-Aug-1961, MRN [id number] PCP:  Sandi Mariscal, MD  Cardiologist:  Dr. Gwenlyn Found   Chief Complaint: f/u hospitalization for elevated troponin  History of Present Illness: [name] is a 55 y.o. female with history of COPD, PVD s/p PTA (followed by Dr. Gwenlyn Found), carotid disease (occluded RCA, 06% LICA s/p L carotid endarectomy 02/2015), DM, paroxysmal atrial fibrillation, previous alcohol/narcotic/cocaine/marijuana abuse, hypothyroidism, chronic combined CHF, small vessel CAD by cath 10/2014, anxiety, HTN, anemia, alcoholic cirrhosis, NSVT, obesity, hypomagnesemia, hypokalemia, h/o noncompliance, recent renal insufficiency (Cr 0.9-1.6) who presents for post-hospital follow-up. Last echo 10/2014: EF 40-45% with inferoapical and posterior HK, difficult study.  She has complex history. She previously self-d/c'd amio/Xarelto due to cost. She has prior history of noncompliance. She has had multiple admissions in the last year. Admitted 7/31-06/19/15 due to acute resp failure felt 2/2 acute on chronic systolic CHF. She was advised by IM at that time to strongly reconsider restarting Xarelto. She told them she had stopped it 2/2 nausea and vomiting. She was then readmitted 8/9 with worsening palpitations and chest discomfort. Troponin was minimally elevated to 0.11 felt 2/2 demand ischemia. She was found to be in rapid atrial fib initially treated with dilt. Given LV dysfunction she was transitioned to higher dose of BB. She converted to NSR. Aspirin was stopped and she was continued on Plavix/Xarelto. There were discussions to consider outpatient stress testing but it seems from the notes the team was not aware she had recently had cath (they were referencing last ischemic study as 2014). Readmitted 10/21-10/25/16 for AF RVR and mildly elevated troponin with titration of rate control in the setting of not picking up her medications. Readmitted  12/1-12/3/16 with AF RVR again with elevated troponin - see by Dr. Rayann Heman who recommended rate control as she was a poor candidate for antiarrhythmic drugs and not an ablation candidate. Readmitted 12/6-12/9/16 with AF RVR, elevated troponin, and uncontrolled DM A1C 11.1 - diltiazem added back to regimen and later increased due to brief episode of atrial tach. Most recent admission was in 12/24-12/26/16 for fever, elevated troponin, leukocytosis. Hemoglobin 8.1. Fecal occult blood test was negative. Anemia panel showed normal ferritin and iron level suggesting anemia of chronic illness.She was also found to have low normal B12 level, thus IM recommended 1000 mcg supplemental daily with f/u level in 1 month. She was started on Rocephin for possible UTI- no other source found- no symptoms of UTI either. IM recommended 3 days of antibiotics only. Her mildly elevated troponin was felt due to demand ischemia in the setting of the above. Last Cr 1.23 (glucose 411 at that time), Hgb 8.1, Mg 2.2. D/c weight 246lb.  She comes in today for follow-up feeling well. Weight 243lb. She denies any recurrent CP/neck pain, dyspnea, or palpitations. She has chronic LEE which improves when she elevates her legs. She has not noticed any bleeding. A good portion of the visit was trying to sort out her medications. Even though recent discharge list said for her to take carvedilol 37.94m BID, she has gone back to only taking 6.258mBID. She is also taking Lasix 4072m.5 tablets BID (despite discharge AVS saying 11m35mD). She says this is what the pharmacy told her to do since that is what her prescription bottles still say on file. She herself cannot read well. She is accompanied by her sister today.    Past Medical History  Diagnosis Date  .  Alcohol abuse   . Narcotic abuse   . Cocaine abuse   . Marijuana abuse   . Tobacco abuse   . Alcoholic cirrhosis ([place])   . Cardiomyopathy ([place])   . Obesity   . Chronic combined  systolic and diastolic CHF (congestive heart failure) ([place])     a. Last echo 12/205: EF 40-45%, inferoapical/posterior HK, not technically sufficient to allow eval of LV diastolic function, normal LA in size..  . Hypothyroidism   . GERD (gastroesophageal reflux disease)   . PAF (paroxysmal atrial fibrillation) ([place])   . Critical lower limb ischemia   . Hypertension   . CAD (coronary artery disease)     a. cath 11/10/2014 small vessel CAD. Demand ischemia in the setting of rapid a-fib  . Anxiety   . Poorly controlled type 2 diabetes mellitus ([place])   . Hyperlipemia   . Peripheral arterial disease ([place])     a. 01/2015 Angio/PTA: LSFA 100 w/ recon @ adductor canal and 1 vessel runoff via AT, RSFA 99 (atherectomy/pta) - 1 vessel runoff via diff dzs peroneal.  . Carotid artery disease ([place])     a. 01/2015 Carotid Angio: RICA 998, LICA 33A. s/p L carotid endarterectomy 02/2015.  [name] COPD (chronic obstructive pulmonary disease) ([place])   . Diabetic peripheral neuropathy ([place])   . Paroxysmal atrial tachycardia ([place])   . Noncompliance   . Anemia   . B12 deficiency   . Alcoholic cirrhosis ([place])   . NSVT (nonsustained ventricular tachycardia) ([place])   . Paroxysmal atrial tachycardia ([place])   . Hypokalemia   . Hypomagnesemia   . Renal insufficiency     a. 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    Past Surgical History  Procedure Laterality Date  . Cardioversion  ~ 02/2013    "twice"   . Lower extremity angiogram N/A 09/10/2013    Procedure: LOWER EXTREMITY ANGIOGRAM;  Surgeon: [name], MD;  Location[facility] C[facility] LAB;  Service: Cardiovascular;  Laterality: N/A;  . Left heart catheterization with coronary angiogram N/A 10/31/2014    Procedure: LEFT HEART CATHETERIZATION WITH CORONARY ANGIOGRAM;  Surgeon: Burnell Blanks, MD;  Location: [facility] CATH LAB;  Service: Cardiovascular;  Laterality: N/A;  . Peripheral athrectomy Right 01/15/2015    SFA/notes 01/15/2015  . Balloon angioplasty, artery Right 01/15/2015     SFA/notes 01/15/2015  . Cardiac catheterization    . Lower extremity angiogram N/A 01/15/2015    Procedure: LOWER EXTREMITY ANGIOGRAM;  Surgeon: [name], MD;  Location: [facility] CATH LAB;  Service: Cardiovascular;  Laterality: N/A;  . Carotid angiogram N/A 01/15/2015    Procedure: CAROTID ANGIOGRAM;  Surgeon: [name], MD;  Location: [facility] CATH LAB;  Service: Cardiovascular;  Laterality: N/A;  . Endarterectomy Left 02/19/2015    Procedure: LEFT CAROTID ENDARTERECTOMY ;  Surgeon: Serafina Mitchell, MD;  Location: [facility] OR;  Service: Vascular;  Laterality: Left;  . Dilation and curettage of uterus  1988    Current Outpatient Prescriptions  Medication Sig Dispense Refill  . acetaminophen (TYLENOL) 500 MG tablet Take 1,000 mg by mouth every 6 (six) hours as needed (pain).    [name] albuterol (PROVENTIL HFA;VENTOLIN HFA) 108 (90 BASE) MCG/ACT inhaler Inhale 2 puffs into the lungs every 6 (six) hours as needed for wheezing or shortness of breath. 1 each 3  . albuterol (PROVENTIL) (2.5 MG/3ML) 0.083% nebulizer solution Take 3 mLs (2.5 mg total) by nebulization every 6 (six) hours as needed. For wheezing. (Patient taking differently: Take 2.5 mg by nebulization  every 6 (six) hours as needed. ) 75 mL 2  . atorvastatin (LIPITOR) 40 MG tablet Take 1 tablet (40 mg total) by mouth daily. 30 tablet 2  . Blood Glucose Monitoring Suppl (ACCU-CHEK AVIVA PLUS) W/DEVICE KIT 1 Device by Does not apply route 4 (four) times daily -  before meals and at bedtime. 1 kit 0  . carvedilol (COREG) 6.25 MG tablet Take 6.25 mg by mouth 2 (two) times daily with a meal.     . cefUROXime (CEFTIN) 500 MG tablet Take 1 tablet (500 mg total) by mouth 2 (two) times daily with a meal. 3 tablet 0  . Cholecalciferol (VITAMIN D) 2000 UNITS tablet Take 2,000 Units by mouth daily.    . clopidogrel (PLAVIX) 75 MG tablet Take 1 tablet (75 mg total) by mouth daily with breakfast. 30 tablet 6  . diltiazem (CARDIZEM CD) 180 MG 24 hr capsule Take 1  capsule (180 mg total) by mouth daily. 30 capsule 6  . folic acid (FOLVITE) 1 MG tablet Take 1 tablet (1 mg total) by mouth daily. 30 tablet 5  . furosemide (LASIX) 40 MG tablet Take 1 tablet (40 mg total) by mouth 2 (two) times daily. (Patient taking differently: Take 40 mg by mouth 2 (two) times daily. TAKE A TABLET AND HALF) 90 tablet 2  . Gabapentin, Once-Daily, (GRALISE) 300 MG TABS Take 300 mg by mouth 4 (four) times daily.     [name] glipiZIDE (GLUCOTROL XL) 10 MG 24 hr tablet Take 2 tablets (20 mg total) by mouth daily with breakfast. 60 tablet 3  . glucose blood (ACCU-CHEK AVIVA PLUS) test strip Use as instructed 100 each 12  . insulin aspart (NOVOLOG) 100 UNIT/ML FlexPen Sliding scale (Patient taking differently: Inject 8-14 Units into the skin 3 (three) times daily with meals. Per sliding scale) 15 mL 11  . insulin glargine (LANTUS) 100 unit/mL SOPN Inject 60 Units into the skin at bedtime.    . Insulin Pen Needle (ULTICARE MICRO PEN NEEDLES) 32G X 4 MM MISC 1 Syringe by Does not apply route 4 (four) times daily - after meals and at bedtime. 1 each 11  . Lancets (ACCU-CHEK SOFT TOUCH) lancets Use as instructed 100 each 12  . levothyroxine (SYNTHROID, LEVOTHROID) 50 MCG tablet Take 1 tablet (50 mcg total) by mouth daily. 30 tablet 2  . lisinopril (PRINIVIL,ZESTRIL) 20 MG tablet Take 20 mg by mouth daily.    . metFORMIN (GLUCOPHAGE) 500 MG tablet Take 500 mg by mouth 3 (three) times daily with meals.     . nitroGLYCERIN (NITROSTAT) 0.4 MG SL tablet Place 1 tablet (0.4 mg total) under the tongue every 5 (five) minutes as needed for chest pain. 25 tablet 3  . pantoprazole (PROTONIX) 40 MG tablet Take 1 tablet (40 mg total) by mouth daily. 30 tablet 2  . rivaroxaban (XARELTO) 20 MG TABS tablet Take 1 tablet (20 mg total) by mouth daily with supper. (Patient taking differently: Take 20 mg by mouth at bedtime. ) 30 tablet 2  . vitamin B-12 1000 MCG tablet Take 1 tablet (1,000 mcg total) by mouth  daily. 30 tablet 5   No current facility-administered medications for this visit.    Allergies:   Amiodarone   Social History:  The patient  reports that she has been smoking E-cigarettes.  She has been smoking about 0.00 packs per day for the past 40 years. She has never used smokeless tobacco. She reports that she does not drink alcohol or  use illicit drugs.   Family History:  The patient's family history includes Cancer in her mother; Clotting disorder in her mother; Diabetes in her mother; Emphysema in her sister; Heart attack in her mother; Heart disease in her father and mother; Hypertension in her father and mother.  ROS:  Please see the history of present illness.   All other systems are reviewed and otherwise negative.   PHYSICAL EXAM:  VS:  BP 128/74 mmHg  Pulse 96  Ht '5\' 4"'  (1.626 m)  Wt 243 lb (110.224 kg)  BMI 41.69 kg/m2  LMP 11/27/2012 BMI: Body mass index is 41.69 kg/(m^2). Well nourished, well developed obese WF, in no acute distress HEENT: normocephalic, atraumatic Neck: no JVD or masses Cardiac:  normal S1, S2; RRR; no murmurs, rubs, or gallops Lungs:  clear to auscultation bilaterally, no wheezing, rhonchi or rales Abd: soft, nontender, no hepatomegaly, + BS MS: no deformity or atrophy Ext: trace-1+ bilateral LE edema Skin: warm and dry, no rash Neuro:  moves all extremities spontaneously, no focal abnormalities noted, follows commands Psych: euthymic mood, full affect   EKG:  Done today shows NSR 96bpm, minimal voltage criterial for LVH, QTc 470m, diffuse TW changes including TWI I, II, avL, V2-V6  Recent Labs: 10/15/2015: B Natriuretic Peptide 246.5* 11/07/2015: ALT 59*; TSH 0.391 11/09/2015: BUN 34*; Creatinine, Ser 1.23*; Hemoglobin 8.1*; Magnesium 2.2; Platelets 296; Potassium 4.1; Sodium 132*  10/16/2015: Cholesterol 160; HDL 35*; LDL Cholesterol 76; Total CHOL/HDL Ratio 4.6; Triglycerides 246*; VLDL 49*   Estimated Creatinine Clearance: 64.2 mL/min  (by C-G formula based on Cr of 1.23).   Wt Readings from Last 3 Encounters:  11/17/15 243 lb (110.224 kg)  11/09/15 246 lb 1.6 oz (111.63 kg)  10/23/15 227 lb 12.8 oz (103.329 kg)     Other studies reviewed: Additional studies/records reviewed today include: summarized above.  ASSESSMENT AND PLAN:  1. PAF - maintaining NSR. Suspect majority of issue here is medication compliance related to poor insight and literacy. She was supposed to be on carvedilol 37.564mBID per recent hospital discharge but has only gone back to 6.2561mID since this was the dose on the most recent refill she had. Will increase to 12.5mg47mD given elevated HR in hopes of preventing further recurrence. Continue anticoagulation. Recheck CBC. 2. CAD - continue medical therapy. Note she is on Plavix for h/o PV issues. 3. Chronic combined CHF -  Dry weight is not totally clear to me. She was discharged at 246lb recently and is down to 243 today, but overall weight appears higher than the fall when she was in the high 220s. She has had poor dietary habits. Mild edema noted on exam but otherwise lungs are clear. Will continue higher dose of Lasix for now (60mg7m). Recheck CMET, Mg. (LFTs added to panel since recent LFTs were abnormal in December.) If Cr is stable I may consider adding an extra dose of Lasix for several days to try and bring her closer to the weight she was at time of discharge in early December (227lb). I [facility] also [facility]. She remains at high risk for rehospitalization. 4. 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 5. Anemia likely due to combo of B12 deficiency and AOCD - she was advised to f/u PCP for further monitoring. She will need recheck of her B12 level in a few weeks per internal medicine recommendation. 6. Tobacco abuse - cessation advised. She is using e-cig and  on some days has only smoked 1 cigarette.   Disposition: Tentatively f/u with  Dr. Gwenlyn Found or APP in 1 month.  Current medicines are reviewed at length with the patient today.  The patient did not have any concerns regarding medicines.  Raechel Ache PA-C 11/17/2015 11:39 AM     [facility] [address] [place] [address] [phone] (office)  [phone] (fax)

## 2015-11-17 ENCOUNTER — Ambulatory Visit (INDEPENDENT_AMBULATORY_CARE_PROVIDER_SITE_OTHER): Payer: Medicare Other | Admitting: Physician Assistant

## 2015-11-17 ENCOUNTER — Encounter: Payer: Self-pay | Admitting: Physician Assistant

## 2015-11-17 ENCOUNTER — Telehealth (HOSPITAL_COMMUNITY): Payer: Self-pay | Admitting: Cardiac Rehabilitation

## 2015-11-17 ENCOUNTER — Other Ambulatory Visit: Payer: Self-pay | Admitting: Family Medicine

## 2015-11-17 VITALS — BP 128/74 | HR 96 | Ht 64.0 in | Wt 243.0 lb

## 2015-11-17 DIAGNOSIS — I480 Paroxysmal atrial fibrillation: Secondary | ICD-10-CM

## 2015-11-17 DIAGNOSIS — I2510 Atherosclerotic heart disease of native coronary artery without angina pectoris: Secondary | ICD-10-CM | POA: Diagnosis not present

## 2015-11-17 DIAGNOSIS — R945 Abnormal results of liver function studies: Secondary | ICD-10-CM

## 2015-11-17 DIAGNOSIS — N289 Disorder of kidney and ureter, unspecified: Secondary | ICD-10-CM

## 2015-11-17 DIAGNOSIS — R7989 Other specified abnormal findings of blood chemistry: Secondary | ICD-10-CM

## 2015-11-17 DIAGNOSIS — D649 Anemia, unspecified: Secondary | ICD-10-CM

## 2015-11-17 DIAGNOSIS — I5042 Chronic combined systolic (congestive) and diastolic (congestive) heart failure: Secondary | ICD-10-CM | POA: Diagnosis not present

## 2015-11-17 DIAGNOSIS — Z720 Tobacco use: Secondary | ICD-10-CM

## 2015-11-17 LAB — CBC WITH DIFFERENTIAL/PLATELET
BASOS PCT: 0 % (ref 0–1)
Basophils Absolute: 0 10*3/uL (ref 0.0–0.1)
EOS ABS: 0.4 10*3/uL (ref 0.0–0.7)
EOS PCT: 4 % (ref 0–5)
HCT: 29.1 % — ABNORMAL LOW (ref 36.0–46.0)
HEMOGLOBIN: 9.4 g/dL — AB (ref 12.0–15.0)
Lymphocytes Relative: 19 % (ref 12–46)
Lymphs Abs: 2 10*3/uL (ref 0.7–4.0)
MCH: 28.7 pg (ref 26.0–34.0)
MCHC: 32.3 g/dL (ref 30.0–36.0)
MCV: 89 fL (ref 78.0–100.0)
MONO ABS: 0.6 10*3/uL (ref 0.1–1.0)
MONOS PCT: 6 % (ref 3–12)
MPV: 10 fL (ref 8.6–12.4)
Neutro Abs: 7.6 10*3/uL (ref 1.7–7.7)
Neutrophils Relative %: 71 % (ref 43–77)
PLATELETS: 418 10*3/uL — AB (ref 150–400)
RBC: 3.27 MIL/uL — AB (ref 3.87–5.11)
RDW: 14.1 % (ref 11.5–15.5)
WBC: 10.7 10*3/uL — AB (ref 4.0–10.5)

## 2015-11-17 LAB — COMPREHENSIVE METABOLIC PANEL
ALBUMIN: 3.3 g/dL — AB (ref 3.6–5.1)
ALT: 48 U/L — ABNORMAL HIGH (ref 6–29)
AST: 38 U/L — ABNORMAL HIGH (ref 10–35)
Alkaline Phosphatase: 208 U/L — ABNORMAL HIGH (ref 33–130)
BILIRUBIN TOTAL: 0.3 mg/dL (ref 0.2–1.2)
BUN: 25 mg/dL (ref 7–25)
CHLORIDE: 98 mmol/L (ref 98–110)
CO2: 30 mmol/L (ref 20–31)
CREATININE: 0.99 mg/dL (ref 0.50–1.05)
Calcium: 8.8 mg/dL (ref 8.6–10.4)
GLUCOSE: 235 mg/dL — AB (ref 65–99)
Potassium: 4 mmol/L (ref 3.5–5.3)
SODIUM: 140 mmol/L (ref 135–146)
Total Protein: 6.7 g/dL (ref 6.1–8.1)

## 2015-11-17 LAB — MAGNESIUM: MAGNESIUM: 1.6 mg/dL (ref 1.5–2.5)

## 2015-11-17 MED ORDER — CARVEDILOL 12.5 MG PO TABS: 12.5000 mg | ORAL_TABLET | Freq: Two times a day (BID) | ORAL | Status: DC

## 2015-11-17 MED ORDER — FUROSEMIDE 40 MG PO TABS: 60.0000 mg | ORAL_TABLET | Freq: Two times a day (BID) | ORAL | Status: DC

## 2015-11-17 NOTE — Telephone Encounter (Signed)
-----   Message from Charlie Pitter, [place] sent at 11/17/2015  1:19 PM EST ----- Regarding: RE: cardiac rehab I saw her this afternoon - cleared to return to rehab. Thx.  ----- Message -----    From: Lowell Guitar, RN    Sent: 11/17/2015   8:59 AM      To: Charlie Pitter, PA-C Subject: cardiac rehab                                  Dear Lisbeth Renshaw,  Pt was seen in office yesterday for hospital followup.  Does she have clearance to return to cardiac rehab?  Thank you, Andi Hence, RN, BSN Cardiac Pulmonary Rehab

## 2015-11-17 NOTE — Patient Instructions (Addendum)
Medication Instructions:  Your physician has recommended you make the following change in your medication:  1- Increase Carvedilol to 12.5 mg by mouth twice daily 2- Lasix 60 mg by mouth twice daily.   Labwork: Your physician recommends that you have lab work today, CMET, Mg 2 and CBC   Testing/Procedures: NONE  Follow-Up: Your physician recommends that you schedule a follow-up appointment in: 1 month with Dr. Gwenlyn Found or APP Nell Range PA or Melina Copa PA)  Your physician recommends that you have home health for heart failure management.   Your physician recommends that you follow-up with your primary care doctor to manage your diabetes and anemia.     If you need a refill on your cardiac medications before your next appointment, please call your pharmacy.

## 2015-11-18 ENCOUNTER — Telehealth: Payer: Self-pay | Admitting: Physician Assistant

## 2015-11-18 ENCOUNTER — Ambulatory Visit (HOSPITAL_COMMUNITY): Payer: Medicaid Other

## 2015-11-18 ENCOUNTER — Emergency Department (HOSPITAL_COMMUNITY): Payer: Medicare Other

## 2015-11-18 ENCOUNTER — Other Ambulatory Visit: Payer: Self-pay | Admitting: *Deleted

## 2015-11-18 ENCOUNTER — Encounter (HOSPITAL_COMMUNITY): Admission: RE | Admit: 2015-11-18 | Payer: Medicare Other | Source: Ambulatory Visit

## 2015-11-18 ENCOUNTER — Emergency Department (HOSPITAL_COMMUNITY)
Admission: EM | Admit: 2015-11-18 | Discharge: 2015-11-18 | Disposition: A | Discharge status: Home / Self Care | Payer: Medicare Other | Attending: Emergency Medicine | Admitting: Emergency Medicine

## 2015-11-18 ENCOUNTER — Encounter (HOSPITAL_COMMUNITY): Payer: Self-pay | Admitting: Family Medicine

## 2015-11-18 DIAGNOSIS — Z9119 Patient's noncompliance with other medical treatment and regimen: Secondary | ICD-10-CM | POA: Insufficient documentation

## 2015-11-18 DIAGNOSIS — Z87448 Personal history of other diseases of urinary system: Secondary | ICD-10-CM | POA: Diagnosis not present

## 2015-11-18 DIAGNOSIS — I10 Essential (primary) hypertension: Secondary | ICD-10-CM | POA: Insufficient documentation

## 2015-11-18 DIAGNOSIS — K219 Gastro-esophageal reflux disease without esophagitis: Secondary | ICD-10-CM | POA: Diagnosis not present

## 2015-11-18 DIAGNOSIS — D649 Anemia, unspecified: Secondary | ICD-10-CM | POA: Diagnosis not present

## 2015-11-18 DIAGNOSIS — Z79899 Other long term (current) drug therapy: Secondary | ICD-10-CM | POA: Diagnosis not present

## 2015-11-18 DIAGNOSIS — R079 Chest pain, unspecified: Secondary | ICD-10-CM | POA: Diagnosis present

## 2015-11-18 DIAGNOSIS — F17210 Nicotine dependence, cigarettes, uncomplicated: Secondary | ICD-10-CM | POA: Diagnosis not present

## 2015-11-18 DIAGNOSIS — E785 Hyperlipidemia, unspecified: Secondary | ICD-10-CM | POA: Diagnosis not present

## 2015-11-18 DIAGNOSIS — E039 Hypothyroidism, unspecified: Secondary | ICD-10-CM | POA: Insufficient documentation

## 2015-11-18 DIAGNOSIS — Z7902 Long term (current) use of antithrombotics/antiplatelets: Secondary | ICD-10-CM | POA: Diagnosis not present

## 2015-11-18 DIAGNOSIS — Z794 Long term (current) use of insulin: Secondary | ICD-10-CM | POA: Insufficient documentation

## 2015-11-18 DIAGNOSIS — Z7901 Long term (current) use of anticoagulants: Secondary | ICD-10-CM | POA: Insufficient documentation

## 2015-11-18 DIAGNOSIS — I25119 Atherosclerotic heart disease of native coronary artery with unspecified angina pectoris: Secondary | ICD-10-CM | POA: Insufficient documentation

## 2015-11-18 DIAGNOSIS — F419 Anxiety disorder, unspecified: Secondary | ICD-10-CM | POA: Diagnosis not present

## 2015-11-18 DIAGNOSIS — I739 Peripheral vascular disease, unspecified: Secondary | ICD-10-CM

## 2015-11-18 DIAGNOSIS — I5042 Chronic combined systolic (congestive) and diastolic (congestive) heart failure: Secondary | ICD-10-CM | POA: Diagnosis not present

## 2015-11-18 DIAGNOSIS — R112 Nausea with vomiting, unspecified: Secondary | ICD-10-CM | POA: Insufficient documentation

## 2015-11-18 DIAGNOSIS — E669 Obesity, unspecified: Secondary | ICD-10-CM | POA: Insufficient documentation

## 2015-11-18 DIAGNOSIS — Z9889 Other specified postprocedural states: Secondary | ICD-10-CM | POA: Diagnosis not present

## 2015-11-18 DIAGNOSIS — E538 Deficiency of other specified B group vitamins: Secondary | ICD-10-CM | POA: Insufficient documentation

## 2015-11-18 DIAGNOSIS — R7989 Other specified abnormal findings of blood chemistry: Secondary | ICD-10-CM

## 2015-11-18 DIAGNOSIS — I480 Paroxysmal atrial fibrillation: Secondary | ICD-10-CM | POA: Diagnosis not present

## 2015-11-18 DIAGNOSIS — E1140 Type 2 diabetes mellitus with diabetic neuropathy, unspecified: Secondary | ICD-10-CM | POA: Diagnosis not present

## 2015-11-18 DIAGNOSIS — R739 Hyperglycemia, unspecified: Secondary | ICD-10-CM

## 2015-11-18 DIAGNOSIS — R0789 Other chest pain: Secondary | ICD-10-CM | POA: Diagnosis not present

## 2015-11-18 DIAGNOSIS — J441 Chronic obstructive pulmonary disease with (acute) exacerbation: Secondary | ICD-10-CM | POA: Insufficient documentation

## 2015-11-18 DIAGNOSIS — E1165 Type 2 diabetes mellitus with hyperglycemia: Secondary | ICD-10-CM | POA: Insufficient documentation

## 2015-11-18 DIAGNOSIS — I209 Angina pectoris, unspecified: Secondary | ICD-10-CM

## 2015-11-18 HISTORY — DX: Other specified abnormalities of plasma proteins: R77.8

## 2015-11-18 HISTORY — DX: Other specified abnormal findings of blood chemistry: R79.89

## 2015-11-18 LAB — BASIC METABOLIC PANEL
ANION GAP: 13 (ref 5–15)
BUN: 17 mg/dL (ref 6–20)
CHLORIDE: 98 mmol/L — AB (ref 101–111)
CO2: 28 mmol/L (ref 22–32)
Calcium: 9.2 mg/dL (ref 8.9–10.3)
Creatinine, Ser: 0.9 mg/dL (ref 0.44–1.00)
GFR calc Af Amer: >60 mL/min (ref >60)
Glucose, Bld: 272 mg/dL — ABNORMAL HIGH (ref 65–99)
POTASSIUM: 4 mmol/L (ref 3.5–5.1)
SODIUM: 139 mmol/L (ref 135–145)

## 2015-11-18 LAB — CBC
HEMATOCRIT: 31 % — AB (ref 36.0–46.0)
HEMOGLOBIN: 9.8 g/dL — AB (ref 12.0–15.0)
MCH: 28.7 pg (ref 26.0–34.0)
MCHC: 31.6 g/dL (ref 30.0–36.0)
MCV: 90.6 fL (ref 78.0–100.0)
PLATELETS: 379 10*3/uL (ref 150–400)
RBC: 3.42 MIL/uL — AB (ref 3.87–5.11)
RDW: 14.2 % (ref 11.5–15.5)
WBC: 11.4 10*3/uL — AB (ref 4.0–10.5)

## 2015-11-18 LAB — CBG MONITORING, ED: GLUCOSE-CAPILLARY: 272 mg/dL — AB (ref 65–99)

## 2015-11-18 LAB — I-STAT TROPONIN, ED
Troponin i, poc: 0.07 ng/mL (ref 0.00–0.08)
Troponin i, poc: 0.5 ng/mL (ref 0.00–0.08)

## 2015-11-18 LAB — TROPONIN I: Troponin I: 0.91 ng/mL (ref <0.031)

## 2015-11-18 MED ORDER — ACETAMINOPHEN 325 MG PO TABS
650.0000 mg | ORAL_TABLET | Freq: Once | ORAL | Status: AC
  Administered 2015-11-18: 650 mg via ORAL
  Filled 2015-11-18: qty 2

## 2015-11-18 MED ORDER — INSULIN GLARGINE 100 UNIT/ML ~~LOC~~ SOLN
60.0000 [IU] | Freq: Once | SUBCUTANEOUS | Status: AC
  Administered 2015-11-18: 60 [IU] via SUBCUTANEOUS
  Filled 2015-11-18 (×2): qty 0.6

## 2015-11-18 MED ORDER — ONDANSETRON 4 MG PO TBDP
8.0000 mg | ORAL_TABLET | Freq: Once | ORAL | Status: AC
  Administered 2015-11-18: 8 mg via ORAL
  Filled 2015-11-18: qty 2

## 2015-11-18 NOTE — ED Notes (Signed)
Pt sts chest pain that started this am radiating into left arm and neck. sts that she vomited.

## 2015-11-18 NOTE — ED Notes (Signed)
Pt stable, ambulatory, states understanding of discharge instructions 

## 2015-11-18 NOTE — Telephone Encounter (Signed)
[place] and talked to Wonda Cheng about patient's medication list. Patient's cardiac medications are correct, informed pharmacist to call patient's PCP for questions about her other medications.

## 2015-11-18 NOTE — Consult Note (Signed)
History & Physical    Patient ID: [name] MRN: [id number], DOB/AGE: [birth year]   Admit date: 11/18/2015  Primary Physician: Sandi Mariscal, MD Primary Cardiologist: Adora Fridge, MD   Patient Profile    55 year old female with a complex medical history including severe small vessel CAD, peripheral vascular disease, CHF with chronic lower extremity edema, paroxysmal atrial fibrillation with intermittent anticoagulation compliance, carotid arterial disease, diabetes, CKD, polysubstance abuse, and multiple admissions who presents to the ED today secondary to left shoulder pain and has been found to have an elevated troponin.  Past Medical History    Past Medical History  Diagnosis Date  . Alcohol abuse   . Narcotic abuse   . Cocaine abuse   . Marijuana abuse   . Tobacco abuse   . Alcoholic cirrhosis ([place])   . Cardiomyopathy ([place])   . Obesity   . Chronic combined systolic and diastolic CHF (congestive heart failure) ([place])     a. Last echo 12/205: EF 40-45%, inferoapical/posterior HK, not technically sufficient to allow eval of LV diastolic function, normal LA in size..  . Hypothyroidism   . GERD (gastroesophageal reflux disease)   . PAF (paroxysmal atrial fibrillation) ([place])   . Critical lower limb ischemia   . Hypertension   . CAD (coronary artery disease)     a. cath 11/10/2014 LM nl, LAD min irregs, D1 30 ost, D2 50d, LCX 44m OM1 80 p/m (1.5 mm vessel), OM2 410mRCA nondom 9019mmed rx.. Demand ischemia in the setting of rapid a-fib.  . AMarland Kitchenxiety   . Poorly controlled type 2 diabetes mellitus (HCC[place]hol[place] . Hyperlipemia   . Peripheral arterial disease ([place]CC[place]   a. 01/2015 Angio/PTA: LSFA 100 w/ recon @ adductor canal and 1 vessel runoff via AT, RSFA 99 (atherectomy/pta) - 1 vessel runoff via diff dzs peroneal.  . Carotid artery disease (HCC[place]   a. 01/2015 Carotid Angio: RICA [id number]/p L carotid endarterectomy 02/2015.  . CMarland KitchenPD (chronic obstructive pulmonary disease) (HCCDills[place] .  Diabetic peripheral neuropathy (HCC[place] . Paroxysmal atrial tachycardia (HCC[place] . Noncompliance   . Anemia   . B12 deficiency   . Alcoholic cirrhosis (HCCCor[place] . NSVT (nonsustained ventricular tachycardia) (HCCBos[place] . Paroxysmal atrial tachycardia (HCCCorvall[place] . Hypokalemia   . Hypomagnesemia   . Renal insufficiency     a. 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  . EMarland Kitchenevated troponin     a. Chronic elevation.    Past Surgical History  Procedure Laterality Date  . Cardioversion  ~ 02/2013    "twice"   . Lower extremity angiogram N/A 09/10/2013    Procedure: LOWER EXTREMITY ANGIOGRAM;  Surgeon: JonLorretta HarpD;  [facility];  Service: Cardiovascular;  Laterality: N/A;  . Left heart catheterization with coronary angiogram N/A 10/31/2014    Procedure: LEFT HEART CATHETERIZATION WITH CORONARY ANGIOGRAM;  Surgeon: ChrBurnell BlanksD;  [facility];  Service: Cardiovascular;  Laterality: N/A;  . Peripheral athrectomy Right 01/15/2015    SFA/notes 01/15/2015  . Balloon angioplasty, artery Right 01/15/2015    SFA/notes 01/15/2015  . Cardiac catheterization    . Lower extremity angiogram N/A 01/15/2015    Procedure: LOWER EXTREMITY ANGIOGRAM;  Surgeon: JonLorretta HarpD;  [facility];  Service: Cardiovascular;  Laterality: N/A;  . Carotid angiogram N/A 01/15/2015    Procedure: CAROTID ANGIOGRAM;  Surgeon: JonLorretta HarpD;  [facility];  Service: Cardiovascular;  Laterality: N/A;  . Endarterectomy Left 02/19/2015    Procedure: LEFT CAROTID ENDARTERECTOMY ;  Surgeon: Serafina Mitchell, MD;  [facility];  Service: Vascular;  Laterality: Left;  . Dilation and curettage of uterus  1988     Allergies  Allergies  Allergen Reactions  . Amiodarone Nausea And Vomiting    History of Present Illness    55 year old female with the above complex past medical history. Most notably, she has a history of peripheral arterial carotid arterial disease and is status post percutaneous  angioplasty to the right SFA in March 2016. She is also status post left carotid endarterectomy in April 2016. She has a history of nonobstructive CAD, chronic combined CHF, and PAF on xarelto.  she was previously on amiodarone but discontinued secondary to cost. She has not always been compliant with Xarelto. She's had multiple admissions this year including admission in July for respiratory failure and CHF, August for palpitations and chest pain in the setting of A. fib RVR with conversion to sinus rhythm on beta blocker therapy, October for A. fib RVR and elevated troponin, December 1-3 for A. fib RVR and elevated troponin. She was seen by ENT during that admission was not felt to be a good candidate for either antiarrhythmic therapy or ablation. She was readmitted December 6-9 with A. fib RVR and elevated troponin as well as uncontrolled diabetes with a hemoglobin A1c of 11.1. Medications were adjusted and diltiazem was added to her regimen. Her most recent admission was December 24-26 for fever, elevated troponin, and leukocytosis. She was also anemic and was found to have a low normal B-12. She was treated for UTI. Elevated troponin at that time was felt to be secondary to demand ischemia. She was just seen back in clinic on January 3 and it was noted that she had not increased her Coreg and Lasix doses as recommended on most recent discharge. Apparently her pharmacy had not yet delivered the higher doses and she says today, that she has run out of insulin as well. Beta blocker and Lasix were adjusted yesterday and a basic metabolic panel was obtained and revealed normal renal function.  This morning, she awoke with mild nausea bilateral shoulder pain, left worse than right. She was not dyspneic. She drank coffee and subsequently became more nauseated and vomited. She continued to have left shoulder pain that was not worse with palpation or rotation. As result, she presented back to the emergency department  this afternoon. Here, ECG is notable for sinus tachycardia and inferolateral ST and T changes, which were present previously. Troponin is again elevated at 0.5. She continues to have left greater than right shoulder pain and also notes intermittent substernal chest discomfort. She denies dyspnea, PND, orthopnea, palpitations, or early satiety. She does have chronic lower extremity edema which is unchanged.   Home Medications    Prior to Admission medications   Medication Sig Start Date End Date Taking? Authorizing Provider  acetaminophen (TYLENOL) 500 MG tablet Take 1,000 mg by mouth every 6 (six) hours as needed (pain).   Yes Historical Provider, MD  acetaminophen-codeine (TYLENOL #3) 300-30 MG tablet Take 1 tablet by mouth every 4 (four) hours as needed for moderate pain.   Yes Historical Provider, MD  albuterol (PROVENTIL HFA;VENTOLIN HFA) 108 (90 BASE) MCG/ACT inhaler Inhale 2 puffs into the lungs every 6 (six) hours as needed for wheezing or shortness of breath. 06/30/15  Yes Arnoldo Morale, MD  albuterol (PROVENTIL) (  2.5 MG/3ML) 0.083% nebulizer solution Take 3 mLs (2.5 mg total) by nebulization every 6 (six) hours as needed. For wheezing. Patient taking differently: Take 2.5 mg by nebulization every 6 (six) hours as needed.  06/30/15  Yes Arnoldo Morale, MD  atorvastatin (LIPITOR) 40 MG tablet Take 1 tablet (40 mg total) by mouth daily. 06/30/15  Yes Arnoldo Morale, MD  carvedilol (COREG) 12.5 MG tablet Take 1 tablet (12.5 mg total) by mouth 2 (two) times daily with a meal. 11/17/15  Yes Dayna N Dunn, PA-C  Cholecalciferol (VITAMIN D) 2000 UNITS tablet Take 2,000 Units by mouth daily.   Yes Historical Provider, MD  clopidogrel (PLAVIX) 75 MG tablet Take 1 tablet (75 mg total) by mouth daily with breakfast. 06/30/15  Yes Arnoldo Morale, MD  diltiazem (CARDIZEM CD) 180 MG 24 hr capsule Take 1 capsule (180 mg total) by mouth daily. 10/23/15  Yes Eileen Stanford, PA-C  furosemide (LASIX) 40 MG tablet Take  1.5 tablets (60 mg total) by mouth 2 (two) times daily. 11/17/15  Yes Dayna N Dunn, PA-C  Gabapentin, Once-Daily, (GRALISE) 300 MG TABS Take 300 mg by mouth 4 (four) times daily.    Yes Historical Provider, MD  glipiZIDE (GLUCOTROL XL) 10 MG 24 hr tablet Take 2 tablets (20 mg total) by mouth daily with breakfast. 06/30/15  Yes Arnoldo Morale, MD  insulin aspart (NOVOLOG) 100 UNIT/ML FlexPen Sliding scale Patient taking differently: Inject 8-14 Units into the skin 3 (three) times daily with meals. Per sliding scale 06/30/15  Yes Arnoldo Morale, MD  insulin glargine (LANTUS) 100 unit/mL SOPN Inject 60 Units into the skin at bedtime.   Yes Historical Provider, MD  levothyroxine (SYNTHROID, LEVOTHROID) 50 MCG tablet Take 1 tablet (50 mcg total) by mouth daily. 06/30/15  Yes Arnoldo Morale, MD  lisinopril (PRINIVIL,ZESTRIL) 20 MG tablet Take 20 mg by mouth daily.   Yes Historical Provider, MD  metFORMIN (GLUCOPHAGE) 500 MG tablet Take 500 mg by mouth 3 (three) times daily with meals.    Yes Historical Provider, MD  nitroGLYCERIN (NITROSTAT) 0.4 MG SL tablet Place 1 tablet (0.4 mg total) under the tongue every 5 (five) minutes as needed for chest pain. 01/16/15  Yes Rogelia Mire, NP  pantoprazole (PROTONIX) 40 MG tablet Take 1 tablet (40 mg total) by mouth daily. 09/22/15 09/21/16 Yes Arnoldo Morale, MD  rivaroxaban (XARELTO) 20 MG TABS tablet Take 1 tablet (20 mg total) by mouth daily with supper. Patient taking differently: Take 20 mg by mouth at bedtime.  06/30/15  Yes Arnoldo Morale, MD  Blood Glucose Monitoring Suppl (ACCU-CHEK AVIVA PLUS) W/DEVICE KIT 1 Device by Does not apply route 4 (four) times daily -  before meals and at bedtime. 07/21/15   Arnoldo Morale, MD  cefUROXime (CEFTIN) 500 MG tablet Take 1 tablet (500 mg total) by mouth 2 (two) times daily with a meal. Patient not taking: Reported on 11/18/2015 11/09/15   Brittainy Erie Noe, PA-C  folic acid (FOLVITE) 1 MG tablet Take 1 tablet (1 mg total) by mouth  daily. Patient not taking: Reported on 11/18/2015 11/09/15   Brittainy Erie Noe, PA-C  glucose blood (ACCU-CHEK AVIVA PLUS) test strip Use as instructed 07/21/15   Arnoldo Morale, MD  Insulin Pen Needle (ULTICARE MICRO PEN NEEDLES) 32G X 4 MM MISC 1 Syringe by Does not apply route 4 (four) times daily - after meals and at bedtime. 06/30/15   Arnoldo Morale, MD  Lancets (ACCU-CHEK SOFT TOUCH) lancets Use as instructed 07/21/15  Arnoldo Morale, MD  vitamin B-12 1000 MCG tablet Take 1 tablet (1,000 mcg total) by mouth daily. Patient not taking: Reported on 11/18/2015 11/10/15   Consuelo Pandy, PA-C    Family History    Family History  Problem Relation Age of Onset  . Hypertension Mother   . Diabetes Mother   . Cancer Mother     breast, ovarian, colon  . Clotting disorder Mother   . Heart disease Mother   . Heart attack Mother   . Hypertension Father   . Heart disease Father   . Emphysema Sister     smoked    Social History    Social History   Social History  . Marital Status: Divorced    Spouse Name: N/A  . Number of Children: N/A  . Years of Education: N/A   Occupational History  . disabled    Social History Main Topics  . Smoking status: Current Some Day Smoker -- 0.00 packs/day for 40 years    Types: E-cigarettes  . Smokeless tobacco: Never Used  . Alcohol Use: No  . Drug Use: No     Comment: 04/29/2015 "last drug use was ~ 09/08/2013"  . Sexual Activity: No   Other Topics Concern  . Not on file   Social History Narrative   Lives in [place], in [place] with sister.  They are looking to move but don't have a place to go yet.       Review of Systems    General:  No chills, fever, night sweats or weight changes.  Cardiovascular:  Positive intermittentchest pain,  positive chronicdyspnea on exertion, positive chronic lower extremity edema,  noorthopnea, palpitations, paroxysmal nocturnal dyspnea. Dermatological: No rash, lesions/masses Respiratory: No cough,  positive chronic  dyspnea on exertion  Urologic: No hematuria, dysuria Abdominal:   Positive nausea with vomiting this morning. This has since resolved. No diarrhea, bright red blood per rectum, melena, or hematemesis Neurologic:  No visual changes, wkns, changes in mental status. All other systems reviewed and are otherwise negative except as noted above.  Physical Exam    Blood pressure 153/80, pulse 100, temperature 98.6 F (37 C), resp. rate 13, last menstrual period 11/27/2012, SpO2 95 %.  General: Pleasant, NAD Psych: Normal affect. Neuro: Alert and oriented X 3. Moves all extremities spontaneously. HEENT: Normal  Neck: Supple without bruits or JVD. Lungs:  Resp regular and unlabored, CTA. Heart: RRR no s3, s4, or murmurs. Abdomen: Soft, non-tender, non-distended, BS + x 4.  Extremities: No clubbing, cyanosis. 1+ bilateral lower extremity edema. DP/PT/Radials 2+ and equal bilaterally. no shoulder or arm pain/tenderness.  Labs    Troponin [facility] Test)  Recent Labs  11/18/15 1723  TROPIPOC 0.50*   Lab Results  Component Value Date   WBC 11.4* 11/18/2015   HGB 9.8* 11/18/2015   HCT 31.0* 11/18/2015   MCV 90.6 11/18/2015   PLT 379 11/18/2015     Recent Labs Lab 11/17/15 1224 11/18/15 1240  NA 140 139  K 4.0 4.0  CL 98 98*  CO2 30 28  BUN 25 17  CREATININE 0.99 0.90  CALCIUM 8.8 9.2  PROT 6.7  --   BILITOT 0.3  --   ALKPHOS 208*  --   ALT 48*  --   AST 38*  --   GLUCOSE 235* 272*   Lab Results  Component Value Date   CHOL 160 10/16/2015   HDL 35* 10/16/2015   LDLCALC 76 10/16/2015   TRIG  246* 10/16/2015   Lab Results  Component Value Date   DDIMER 0.48 10/29/2014     Radiology Studies    Dg Chest 2 View  11/18/2015  CLINICAL DATA:  Upper midline chest pain EXAM: CHEST  2 VIEW COMPARISON:  11/08/2015 FINDINGS: Normal heart size and mediastinal contours. No acute infiltrate or edema. No effusion or pneumothorax. No acute osseous findings. IMPRESSION: No  active cardiopulmonary disease. Electronically Signed   By: Monte Fantasia M.D.   On: 11/18/2015 14:38   ECG & Cardiac Imaging    Sinus tach, 105, inflat st/t changes - similar to prior ecg's.  Assessment & Plan    1.  Bilateral shoulder pain/non-obstructive small vessel CAD/chronic troponin elevation:  Pt presented to the ED this afternoon with complaints of bilateral, L>R shoulder pain and intermittent chest discomfort and has been found to have a mildly elevated POC troponin, currently at 0.50.  Shoulder pain currently improved.  ECG non-acute.  Troponins reviewed and noted to be chronically elevated dating back to 08/2014.  The highest troponin recorded in the past year was 4.89 (old scale - nl < 0.30), and cath was performed at that time revealing small vessel dzs (LM nl, LAD min irregs, D1 30 ost, D2 50d, LCX 42m OM1 80 p/m (1.5 mm vessel), OM2 452mRCA nondom 902m She has been medically managed since and had mild troponin elevations with flat trend on all of her admissions this past December.  Given severe, small vessel OM1 and RCA dzs, it is conceivable that she has some degree of chronic demand ischemia, which in the past has been worsened by rapid afib.  She is not in afib today, though she is in sinus tach - likely 2/2 inadequate beta blockade (recently d/c'd on coreg 37.5 bid but was only taking 6.25 bid).  Recommend repeating troponin in ER now.  If trend remains flat and troponin is < 1, would recommend continued medical therapy and possibly ER discharge.  Given her living situation (motel with sister - notes cockroach infestation) and multiple readmissions since October, she will benefit from case mgmt/social work eval in the ED.  Cont statin, bb (titrated yesterday to 12.5 bid), acei, plavix.  No ASA 2/2 plavix/xarelto.  2.  PAF:  In sinus.  Cont bb/xarelto.  She was recently d/c'd on coreg 37.5 mg bid but was only taking 6.25 bid.  Dose was increased to 12.5 bid but not clear that she  has increased this yet.  3.  Chronic systolic CHF:  Volume appears stable.  Cont bb/acei/lasix - tolerating lasix 60 bid.  4. HL:  Cont statin.  5.  Relative homelessness:  Living in motel with sister.  As above, would benefit from cm/sw eval.  Signed, ChrMurray HodgkinsP 11/18/2015, 7:27 PM    Personally seen and examined. Agree with above. [age]ar old with severe PVD, small vessel CAD (high grade branching small OM and non dominant RCA) on cath 2015 with chronically elevated troponin who currently is struggling in her current living arrangements.   During my interview, she stated that her "pain"was improved. She was concerned about insulin later tonight because the pharmacy did not send to her.   Exam with no rubs, no new murmurs, RRR.   Since troponin is chronically elevated and has demonstrated flat trends in the past, it is challenging to interpret and does not likely represent true ACS (thrombosis). More like a demand ischemia presentation. If troponin remains flat and less than 2 (as  we have seen in the past which prompted her prior cath), it would not be unreasonable to DC from ER. Note however that her chronically elevated troponin does portend increase morbidity and mortality from a CV perspective (much like we see in ESRD patients for instance).   Agree with increased Beta blocker as above.   Social work. ?[facility] assistance?  Candee Furbish, MD  Could consider nitrate in future.

## 2015-11-18 NOTE — Care Management (Signed)
ED CM met with patient at bedside concerning medication assistance. Patient reports that she received her Medication today from [place] but Lantus insulin was missing. Patient attempted to contact the pharmacy unable speak with anyone she came to the ED. ED CM contacted [place] after hour line [phone] regarding patient's insulin. The Pharmacy will follow up with her in the am at the verified number to deliver her insulin in the am. CM discussed with Dr. Christy Gentles, about provided a dose here in the ED prior to discharge, he is agreeable. Patient instructed to contact pharmacy first thing in the am to ensure she receives the insulin tomorrow afternoon. Teach back done patient verbalized understanding. No further ED CM needs identified.

## 2015-11-18 NOTE — ED Provider Notes (Signed)
CSN: [id number]     Arrival date & time 11/18/15  1227 History   First MD Initiated Contact with Patient 11/18/15 1637     Chief Complaint  Patient presents with  . Chest Pain  . Arm Pain    Patient is a 55 y.o. female presenting with chest pain and arm pain. The history is provided by the patient.  Chest Pain Pain location:  L chest Pain quality: tightness   Pain radiates to:  L shoulder Pain severity:  Moderate Onset quality:  Gradual Timing:  Intermittent Progression:  Resolved Chronicity:  Recurrent Relieved by:  Nothing Worsened by:  Nothing tried Associated symptoms: nausea, shortness of breath and vomiting   Associated symptoms: no fever   Arm Pain Associated symptoms include chest pain and shortness of breath.  pt reports since 6am, she has had intermittent episodes of chest pain that last for about 20 minutes at a time. She is CP free at this time. She has extensive h/o CAD and this feels similar to prior episodes   Past Medical History  Diagnosis Date  . Alcohol abuse   . Narcotic abuse   . Cocaine abuse   . Marijuana abuse   . Tobacco abuse   . Alcoholic cirrhosis ([place])   . Cardiomyopathy ([place])   . Obesity   . Chronic combined systolic and diastolic CHF (congestive heart failure) ([place])     a. Last echo 12/205: EF 40-45%, inferoapical/posterior HK, not technically sufficient to allow eval of LV diastolic function, normal LA in size..  . Hypothyroidism   . GERD (gastroesophageal reflux disease)   . PAF (paroxysmal atrial fibrillation) ([place])   . Critical lower limb ischemia   . Hypertension   . CAD (coronary artery disease)     a. cath 11/10/2014 small vessel CAD. Demand ischemia in the setting of rapid a-fib  . Anxiety   . Poorly controlled type 2 diabetes mellitus ([place])   . Hyperlipemia   . Peripheral arterial disease ([place])     a. 01/2015 Angio/PTA: LSFA 100 w/ recon @ adductor canal and 1 vessel runoff via AT, RSFA 99 (atherectomy/pta) - 1 vessel runoff via  diff dzs peroneal.  . Carotid artery disease ([place])     a. 01/2015 Carotid Angio: RICA 732, LICA 20U. s/p L carotid endarterectomy 02/2015.  [name] COPD (chronic obstructive pulmonary disease) ([place])   . Diabetic peripheral neuropathy ([place])   . Paroxysmal atrial tachycardia ([place])   . Noncompliance   . Anemia   . B12 deficiency   . Alcoholic cirrhosis ([place])   . NSVT (nonsustained ventricular tachycardia) ([place])   . Paroxysmal atrial tachycardia ([place])   . Hypokalemia   . Hypomagnesemia   . Renal insufficiency     a. 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   Past Surgical History  Procedure Laterality Date  . Cardioversion  ~ 02/2013    "twice"   . Lower extremity angiogram N/A 09/10/2013    Procedure: LOWER EXTREMITY ANGIOGRAM;  Surgeon: [name], MD;  Location: [facility] CATH LAB;  Service: Cardiovascular;  Laterality: N/A;  . Left heart catheterization with coronary angiogram N/A 10/31/2014    Procedure: LEFT HEART CATHETERIZATION WITH CORONARY ANGIOGRAM;  Surgeon: Burnell Blanks, MD;  Location: [facility] CATH LAB;  Service: Cardiovascular;  Laterality: N/A;  . Peripheral athrectomy Right 01/15/2015    SFA/notes 01/15/2015  . Balloon angioplasty, artery Right 01/15/2015    SFA/notes 01/15/2015  . Cardiac catheterization    . Lower extremity angiogram N/A 01/15/2015  Procedure: LOWER EXTREMITY ANGIOGRAM;  Surgeon: [name], MD;  Location: [facility] CATH LAB;  Service: Cardiovascular;  Laterality: N/A;  . Carotid angiogram N/A 01/15/2015    Procedure: CAROTID ANGIOGRAM;  Surgeon: [name], MD;  Location: [facility] CATH LAB;  Service: Cardiovascular;  Laterality: N/A;  . Endarterectomy Left 02/19/2015    Procedure: LEFT CAROTID ENDARTERECTOMY ;  Surgeon: Serafina Mitchell, MD;  Location: [facility] OR;  Service: Vascular;  Laterality: Left;  . Dilation and curettage of uterus  1988   Family History  Problem Relation Age of Onset  . Hypertension Mother   . Diabetes Mother   . Cancer Mother     breast, ovarian, colon  .  Clotting disorder Mother   . Heart disease Mother   . Heart attack Mother   . Hypertension Father   . Heart disease Father   . Emphysema Sister     smoked   Social History  Substance Use Topics  . Smoking status: Current Some Day Smoker -- 0.00 packs/day for 40 years    Types: E-cigarettes  . Smokeless tobacco: Never Used  . Alcohol Use: No   OB History    Gravida Para Term Preterm AB TAB SAB Ectopic Multiple Living   _0 Review of Systems  Constitutional: Negative for fever.  Respiratory: Positive for shortness of breath.   Cardiovascular: Positive for chest pain.  Gastrointestinal: Positive for nausea and vomiting.  All other systems reviewed and are negative.     Allergies  Amiodarone  Home Medications   Prior to Admission medications   Medication Sig Start Date End Date Taking? Authorizing Provider  acetaminophen (TYLENOL) 500 MG tablet Take 1,000 mg by mouth every 6 (six) hours as needed (pain).    Historical Provider, MD  albuterol (PROVENTIL HFA;VENTOLIN HFA) 108 (90 BASE) MCG/ACT inhaler Inhale 2 puffs into the lungs every 6 (six) hours as needed for wheezing or shortness of breath. 06/30/15   Arnoldo Morale, MD  albuterol (PROVENTIL) (2.5 MG/3ML) 0.083% nebulizer solution Take 3 mLs (2.5 mg total) by nebulization every 6 (six) hours as needed. For wheezing. Patient taking differently: Take 2.5 mg by nebulization every 6 (six) hours as needed.  06/30/15   Arnoldo Morale, MD  atorvastatin (LIPITOR) 40 MG tablet Take 1 tablet (40 mg total) by mouth daily. 06/30/15   Arnoldo Morale, MD  Blood Glucose Monitoring Suppl (ACCU-CHEK AVIVA PLUS) W/DEVICE KIT 1 Device by Does not apply route 4 (four) times daily -  before meals and at bedtime. 07/21/15   Arnoldo Morale, MD  carvedilol (COREG) 12.5 MG tablet Take 1 tablet (12.5 mg total) by mouth 2 (two) times daily with a meal. 11/17/15   Dayna N Dunn, PA-C  cefUROXime (CEFTIN) 500 MG tablet Take 1 tablet (500 mg total) by  mouth 2 (two) times daily with a meal. 11/09/15   Brittainy Erie Noe, PA-C  Cholecalciferol (VITAMIN D) 2000 UNITS tablet Take 2,000 Units by mouth daily.    Historical Provider, MD  clopidogrel (PLAVIX) 75 MG tablet Take 1 tablet (75 mg total) by mouth daily with breakfast. 06/30/15   Arnoldo Morale, MD  diltiazem (CARDIZEM CD) 180 MG 24 hr capsule Take 1 capsule (180 mg total) by mouth daily. 10/23/15   Eileen Stanford, PA-C  folic acid (FOLVITE) 1 MG tablet Take 1 tablet (1 mg total) by mouth daily. 11/09/15   Brittainy Erie Noe, PA-C  furosemide (  LASIX) 40 MG tablet Take 1.5 tablets (60 mg total) by mouth 2 (two) times daily. 11/17/15   Dayna N Dunn, PA-C  Gabapentin, Once-Daily, (GRALISE) 300 MG TABS Take 300 mg by mouth 4 (four) times daily.     Historical Provider, MD  glipiZIDE (GLUCOTROL XL) 10 MG 24 hr tablet Take 2 tablets (20 mg total) by mouth daily with breakfast. 06/30/15   Arnoldo Morale, MD  glucose blood (ACCU-CHEK AVIVA PLUS) test strip Use as instructed 07/21/15   Arnoldo Morale, MD  insulin aspart (NOVOLOG) 100 UNIT/ML FlexPen Sliding scale Patient taking differently: Inject 8-14 Units into the skin 3 (three) times daily with meals. Per sliding scale 06/30/15   Arnoldo Morale, MD  insulin glargine (LANTUS) 100 unit/mL SOPN Inject 60 Units into the skin at bedtime.    Historical Provider, MD  Insulin Pen Needle (ULTICARE MICRO PEN NEEDLES) 32G X 4 MM MISC 1 Syringe by Does not apply route 4 (four) times daily - after meals and at bedtime. 06/30/15   Arnoldo Morale, MD  Lancets (ACCU-CHEK SOFT TOUCH) lancets Use as instructed 07/21/15   Arnoldo Morale, MD  levothyroxine (SYNTHROID, LEVOTHROID) 50 MCG tablet Take 1 tablet (50 mcg total) by mouth daily. 06/30/15   Arnoldo Morale, MD  lisinopril (PRINIVIL,ZESTRIL) 20 MG tablet Take 20 mg by mouth daily.    Historical Provider, MD  metFORMIN (GLUCOPHAGE) 500 MG tablet Take 500 mg by mouth 3 (three) times daily with meals.     Historical Provider, MD   nitroGLYCERIN (NITROSTAT) 0.4 MG SL tablet Place 1 tablet (0.4 mg total) under the tongue every 5 (five) minutes as needed for chest pain. 01/16/15   Rogelia Mire, NP  pantoprazole (PROTONIX) 40 MG tablet Take 1 tablet (40 mg total) by mouth daily. 09/22/15 09/21/16  Arnoldo Morale, MD  rivaroxaban (XARELTO) 20 MG TABS tablet Take 1 tablet (20 mg total) by mouth daily with supper. Patient taking differently: Take 20 mg by mouth at bedtime.  06/30/15   Arnoldo Morale, MD  vitamin B-12 1000 MCG tablet Take 1 tablet (1,000 mcg total) by mouth daily. 11/10/15   Brittainy Erie Noe, PA-C   BP 155/82 mmHg  Pulse 90  Temp(Src) 98.6 F (37 C)  Resp 14  SpO2 93%  LMP 11/27/2012 Physical Exam CONSTITUTIONAL: Well developed/well nourished HEAD: Normocephalic/atraumatic EYES: EOMI ENMT: Mucous membranes moist NECK: supple no meningeal signs SPINE/BACK:entire spine nontender CV: S1/S2 noted, no murmurs/rubs/gallops noted LUNGS: Lungs are clear to auscultation bilaterally, no apparent distress ABDOMEN: soft, nontender, no rebound or guarding, bowel sounds noted throughout abdomen NEURO: Pt is awake/alert/appropriate, moves all extremitiesx4.  No facial droop.   EXTREMITIES: pulses normal/equal, full ROM SKIN: warm, color normal PSYCH: no abnormalities of mood noted, alert and oriented to situation  ED Course  Procedures  Labs Review Labs Reviewed  BASIC METABOLIC PANEL - Abnormal; Notable for the following:    Chloride 98 (*)    Glucose, Bld 272 (*)    All other components within normal limits  CBC - Abnormal; Notable for the following:    WBC 11.4 (*)    RBC 3.42 (*)    Hemoglobin 9.8 (*)    HCT 31.0 (*)    All other components within normal limits  I-STAT TROPOININ, ED - Abnormal; Notable for the following:    Troponin i, poc 0.50 (*)    All other components within normal limits  TROPONIN I  I-STAT TROPOININ, ED    Imaging Review Dg Chest 2  View  11/18/2015  CLINICAL DATA:   Upper midline chest pain EXAM: CHEST  2 VIEW COMPARISON:  11/08/2015 FINDINGS: Normal heart size and mediastinal contours. No acute infiltrate or edema. No effusion or pneumothorax. No acute osseous findings. IMPRESSION: No active cardiopulmonary disease. Electronically Signed   By: Monte Fantasia M.D.   On: 11/18/2015 14:38   I have personally reviewed and evaluated these images and lab results as part of my medical decision-making.   EKG Interpretation   Date/Time:  Wednesday November 18 2015 12:33:50 EST Ventricular Rate:  105 PR Interval:  168 QRS Duration: 90 QT Interval:  368 QTC Calculation: 486 R Axis:   27 Text Interpretation:  Sinus tachycardia Septal infarct , age undetermined  ST \T\ T wave abnormality, consider inferolateral ischemia Abnormal ECG No  significant change since last tracing Confirmed by Christy Gentles  MD, [place]  [phone]) on 11/18/2015 4:47:28 PM     5:30 PM Pt CP free at this time She has extensive h/o CAD Cardiology consulted She takes both plavix/xarelto 7:15 PM Seen by cardiology dr Marlou Porch Pt has chronically elevated troponin She is CP free with unchanged EKG at this time If repeat troponin is 2 or less she can be discharged She may need assistance with home meds 9:04 PM Pt improved She would like to be discharged Troponin less than 2 Discussed return precautions She was given lantus dose and CM has seen patient and arranged home meds  MDM   Final diagnoses:  Ischemic chest pain ([place])  Hyperglycemia    Nursing notes including past medical history and social history reviewed and considered in documentation xrays/imaging reviewed by myself and considered during evaluation Labs/vital reviewed myself and considered during evaluation Previous records reviewed and considered     Ripley Fraise, MD 11/18/15 2105

## 2015-11-18 NOTE — Discharge Instructions (Signed)
Angina Pectoris Angina pectoris, often called angina, is extreme discomfort in the chest, neck, or arm. This is caused by a lack of blood in the middle and thickest layer of the heart wall (myocardium). There are four types of angina:  Stable angina. Stable angina usually occurs in episodes of predictable frequency and duration. It is usually brought on by physical activity, stress, or excitement. Stable angina usually lasts a few minutes and can often be relieved by a medicine that you place under your tongue. This medicine is called sublingual nitroglycerin.  Unstable angina. Unstable angina can occur even when you are doing little or no physical activity. It can even occur while you are sleeping or when you are at rest. It can suddenly increase in severity or frequency. It may not be relieved by sublingual nitroglycerin, and it can last up to 30 minutes.  Microvascular angina. This type of angina is caused by a disorder of tiny blood vessels called arterioles. Microvascular angina is more common in women. The pain may be more severe and last longer than other types of angina pectoris.  Prinzmetal or variant angina. This type of angina pectoris is rare and usually occurs when you are doing little or no physical activity. It especially occurs in the early morning hours. CAUSES Atherosclerosis is the cause of angina. This is the buildup of fat and cholesterol (plaque) on the inside of the arteries. Over time, the plaque may narrow or block the artery, and this will lessen blood flow to the heart. Plaque can also become weak and break off within a coronary artery to form a clot and cause a sudden blockage. RISK FACTORS Risk factors common to both men and women include:  High cholesterol levels.  High blood pressure (hypertension).  Tobacco use.  Diabetes.  Family history of angina.  Obesity.  Lack of exercise.  A diet high in saturated fats. Women are at greater risk for angina if they  are:  Over age 2.  Postmenopausal. SYMPTOMS Many people do not experience any symptoms during the early stages of angina. As the condition progresses, symptoms common to both men and women may include:  Chest pain.  The pain can be described as a crushing or squeezing in the chest, or a tightness, pressure, fullness, or heaviness in the chest.  The pain can last more than a few minutes, or it can stop and recur.  Pain in the arms, neck, jaw, or back.  Unexplained heartburn or indigestion.  Shortness of breath.  Nausea.  Sudden cold sweats.  Sudden light-headedness. Many women have chest discomfort and some of the other symptoms. However, women often have different (atypical) symptoms, such as:   Fatigue.  Unexplained feelings of nervousness or anxiety.  Unexplained weakness.  Dizziness or fainting. Sometimes, women may have angina without any symptoms. DIAGNOSIS  Tests to diagnose angina may include:  ECG (electrocardiogram).  Exercise stress test. This looks for signs of blockage when the heart is being exercised.  Pharmacologic stress test. This test looks for signs of blockage when the heart is being stressed with a medicine.  Blood tests.  Coronary angiogram. This is a procedure to look at the coronary arteries to see if there is any blockage. TREATMENT  The treatment of angina may include the following:  Healthy behavioral changes to reduce or control risk factors.  Medicine.  Coronary stenting.A stent helps to keep an artery open.  Coronary angioplasty. This procedure widens a narrowed or blocked artery.  Coronary arterybypass  surgery. This will allow your blood to pass the blockage (bypass) to reach your heart. HOME CARE INSTRUCTIONS   Take medicines only as directed by your health care provider.  Do not take the following medicines unless your health care provider approves:  Nonsteroidal anti-inflammatory drugs (NSAIDs), such as ibuprofen,  naproxen, or celecoxib.  Vitamin supplements that contain vitamin A, vitamin E, or both.  Hormone replacement therapy that contains estrogen with or without progestin.  Manage other health conditions such as hypertension and diabetes as directed by your health care provider.  Follow a heart-healthy diet. A dietitian can help to educate you about healthy food options and changes.  Use healthy cooking methods such as roasting, grilling, broiling, baking, poaching, steaming, or stir-frying. Talk to a dietitian to learn more about healthy cooking methods.  Follow an exercise program approved by your health care provider.  Maintain a healthy weight. Lose weight as approved by your health care provider.  Plan rest periods when fatigued.  Learn to manage stress.  Do not use any tobacco products, including cigarettes, chewing tobacco, or electronic cigarettes. If you need help quitting, ask your health care provider.  If you drink alcohol, and your health care provider approves, limit your alcohol intake to no more than 1 drink per day. One drink equals 12 ounces of beer, 5 ounces of wine, or 1 ounces of hard liquor.  Stop illegal drug use.  Keep all follow-up visits as directed by your health care provider. This is important. SEEK IMMEDIATE MEDICAL CARE IF:   You have pain in your chest, neck, arm, jaw, stomach, or back that lasts more than a few minutes, is recurring, or is unrelieved by taking sublingualnitroglycerin.  You have profuse sweating without cause.  You have unexplained:  Heartburn or indigestion.  Shortness of breath or difficulty breathing.  Nausea or vomiting.  Fatigue.  Feelings of nervousness or anxiety.  Weakness.  Diarrhea.  You have sudden light-headedness or dizziness.  You faint. These symptoms may represent a serious problem that is an emergency. Do not wait to see if the symptoms will go away. Get medical help right away. Call your local  emergency services (911 in the [place] Do not drive yourself to the hospital.   This information is not intended to replace advice given to you by your health care provider. Make sure you discuss any questions you have with your health care provider.   Document Released: 10/31/2005 Document Revised: 11/21/2014 Document Reviewed: 03/04/2014 Elsevier Interactive Patient Education [employer].

## 2015-11-18 NOTE — Telephone Encounter (Signed)
NeW Message  Pharmacist wanted to clarify a list of meds that were sent to [facility]- Please call back and discuss.

## 2015-11-19 ENCOUNTER — Other Ambulatory Visit: Payer: Self-pay | Admitting: Family Medicine

## 2015-11-20 ENCOUNTER — Encounter (HOSPITAL_COMMUNITY): Admission: RE | Admit: 2015-11-20 | Payer: Medicare Other | Source: Ambulatory Visit

## 2015-11-20 ENCOUNTER — Ambulatory Visit (HOSPITAL_COMMUNITY): Payer: Medicaid Other

## 2015-11-20 ENCOUNTER — Telehealth (HOSPITAL_COMMUNITY): Payer: Self-pay | Admitting: Cardiac Rehabilitation

## 2015-11-20 NOTE — Telephone Encounter (Signed)
-----  Message from Rogelia Mire, NP sent at 11/20/2015 11:51 AM EST ----- Regarding: RE: cardiac rehab Hi Jimma Ortman,  She can return to rehab.  She has small vessel dzs and is likely to have some degree of angina.  In reality, cardiac rehab may well be the best medicine we can offer.  Her social situation is unfortunate and I suspect it contributes greatly to her noncompliance, rehospitalizations, etc.  I think her ER visit the other night was largely driven by needing a place that didn't have cockroaches and had food.  Sad.  Thanks,  Gerald Stabs ----- Message -----    From: Lowell Guitar, RN    Sent: 11/20/2015  10:50 AM      To: Rogelia Mire, NP Subject: cardiac rehab                                  Dear Gerald Stabs,  Pt was treated and released in ED.  Is it ok for her to return to cardiac rehab?    Also, social work met with her but only addressed specific medication questions not home medication management and housing.  I have previously tried [facility] and she doesn't qualify because of insurance. i requested home health through her PCP but that was limited.    I will request social work assistance again as outpatient.  I share your concerns and appreciate you addressing them.     Thank you, Andi Hence, RN, BSN Cardiac Pulmonary Rehab

## 2015-11-23 ENCOUNTER — Ambulatory Visit (HOSPITAL_COMMUNITY): Payer: Medicaid Other

## 2015-11-23 ENCOUNTER — Encounter (HOSPITAL_COMMUNITY)
Admission: RE | Admit: 2015-11-23 | Discharge: 2015-11-23 | Disposition: A | Discharge status: Home / Self Care | Payer: Medicare Other | Source: Ambulatory Visit | Attending: Cardiovascular Disease | Admitting: Cardiovascular Disease

## 2015-11-23 ENCOUNTER — Encounter (HOSPITAL_COMMUNITY): Payer: Self-pay | Admitting: Emergency Medicine

## 2015-11-23 ENCOUNTER — Other Ambulatory Visit: Payer: Self-pay

## 2015-11-23 ENCOUNTER — Emergency Department (HOSPITAL_COMMUNITY): Payer: Medicare Other

## 2015-11-23 ENCOUNTER — Inpatient Hospital Stay (HOSPITAL_COMMUNITY)
Admission: EM | Admit: 2015-11-23 | Discharge: 2015-12-03 | DRG: 208 | Disposition: A | Discharge status: Home Health | Payer: Medicare Other | Attending: Internal Medicine | Admitting: Internal Medicine

## 2015-11-23 DIAGNOSIS — I248 Other forms of acute ischemic heart disease: Secondary | ICD-10-CM | POA: Diagnosis not present

## 2015-11-23 DIAGNOSIS — J209 Acute bronchitis, unspecified: Secondary | ICD-10-CM | POA: Diagnosis present

## 2015-11-23 DIAGNOSIS — J440 Chronic obstructive pulmonary disease with acute lower respiratory infection: Secondary | ICD-10-CM | POA: Diagnosis present

## 2015-11-23 DIAGNOSIS — E785 Hyperlipidemia, unspecified: Secondary | ICD-10-CM | POA: Diagnosis present

## 2015-11-23 DIAGNOSIS — I5043 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) and diastolic (congestive) heart failure: Secondary | ICD-10-CM

## 2015-11-23 DIAGNOSIS — R7989 Other specified abnormal findings of blood chemistry: Secondary | ICD-10-CM | POA: Insufficient documentation

## 2015-11-23 DIAGNOSIS — F419 Anxiety disorder, unspecified: Secondary | ICD-10-CM | POA: Diagnosis present

## 2015-11-23 DIAGNOSIS — I429 Cardiomyopathy, unspecified: Secondary | ICD-10-CM | POA: Insufficient documentation

## 2015-11-23 DIAGNOSIS — E039 Hypothyroidism, unspecified: Secondary | ICD-10-CM | POA: Diagnosis present

## 2015-11-23 DIAGNOSIS — R062 Wheezing: Secondary | ICD-10-CM | POA: Insufficient documentation

## 2015-11-23 DIAGNOSIS — I509 Heart failure, unspecified: Secondary | ICD-10-CM

## 2015-11-23 DIAGNOSIS — Z01818 Encounter for other preprocedural examination: Secondary | ICD-10-CM

## 2015-11-23 DIAGNOSIS — J9601 Acute respiratory failure with hypoxia: Secondary | ICD-10-CM

## 2015-11-23 DIAGNOSIS — I5033 Acute on chronic diastolic (congestive) heart failure: Secondary | ICD-10-CM | POA: Insufficient documentation

## 2015-11-23 DIAGNOSIS — Z6841 Body Mass Index (BMI) 40.0 and over, adult: Secondary | ICD-10-CM

## 2015-11-23 DIAGNOSIS — I5042 Chronic combined systolic (congestive) and diastolic (congestive) heart failure: Secondary | ICD-10-CM | POA: Insufficient documentation

## 2015-11-23 DIAGNOSIS — E669 Obesity, unspecified: Secondary | ICD-10-CM | POA: Diagnosis present

## 2015-11-23 DIAGNOSIS — N179 Acute kidney failure, unspecified: Secondary | ICD-10-CM | POA: Diagnosis present

## 2015-11-23 DIAGNOSIS — E1165 Type 2 diabetes mellitus with hyperglycemia: Secondary | ICD-10-CM | POA: Diagnosis present

## 2015-11-23 DIAGNOSIS — E1151 Type 2 diabetes mellitus with diabetic peripheral angiopathy without gangrene: Secondary | ICD-10-CM | POA: Diagnosis present

## 2015-11-23 DIAGNOSIS — F17200 Nicotine dependence, unspecified, uncomplicated: Secondary | ICD-10-CM | POA: Diagnosis present

## 2015-11-23 DIAGNOSIS — I480 Paroxysmal atrial fibrillation: Secondary | ICD-10-CM | POA: Diagnosis present

## 2015-11-23 DIAGNOSIS — I130 Hypertensive heart and chronic kidney disease with heart failure and stage 1 through stage 4 chronic kidney disease, or unspecified chronic kidney disease: Secondary | ICD-10-CM | POA: Diagnosis present

## 2015-11-23 DIAGNOSIS — Z7902 Long term (current) use of antithrombotics/antiplatelets: Secondary | ICD-10-CM

## 2015-11-23 DIAGNOSIS — I2489 Other forms of acute ischemic heart disease: Secondary | ICD-10-CM | POA: Insufficient documentation

## 2015-11-23 DIAGNOSIS — E1122 Type 2 diabetes mellitus with diabetic chronic kidney disease: Secondary | ICD-10-CM | POA: Diagnosis present

## 2015-11-23 DIAGNOSIS — K7030 Alcoholic cirrhosis of liver without ascites: Secondary | ICD-10-CM | POA: Diagnosis present

## 2015-11-23 DIAGNOSIS — R778 Other specified abnormalities of plasma proteins: Secondary | ICD-10-CM | POA: Insufficient documentation

## 2015-11-23 DIAGNOSIS — J9621 Acute and chronic respiratory failure with hypoxia: Principal | ICD-10-CM | POA: Diagnosis present

## 2015-11-23 DIAGNOSIS — N183 Chronic kidney disease, stage 3 (moderate): Secondary | ICD-10-CM | POA: Diagnosis present

## 2015-11-23 DIAGNOSIS — E1142 Type 2 diabetes mellitus with diabetic polyneuropathy: Secondary | ICD-10-CM | POA: Diagnosis present

## 2015-11-23 DIAGNOSIS — J9600 Acute respiratory failure, unspecified whether with hypoxia or hypercapnia: Secondary | ICD-10-CM

## 2015-11-23 DIAGNOSIS — Z794 Long term (current) use of insulin: Secondary | ICD-10-CM

## 2015-11-23 DIAGNOSIS — J45909 Unspecified asthma, uncomplicated: Secondary | ICD-10-CM | POA: Diagnosis present

## 2015-11-23 DIAGNOSIS — J441 Chronic obstructive pulmonary disease with (acute) exacerbation: Secondary | ICD-10-CM | POA: Diagnosis present

## 2015-11-23 DIAGNOSIS — D519 Vitamin B12 deficiency anemia, unspecified: Secondary | ICD-10-CM | POA: Diagnosis present

## 2015-11-23 DIAGNOSIS — D649 Anemia, unspecified: Secondary | ICD-10-CM | POA: Diagnosis present

## 2015-11-23 DIAGNOSIS — I2510 Atherosclerotic heart disease of native coronary artery without angina pectoris: Secondary | ICD-10-CM | POA: Diagnosis present

## 2015-11-23 DIAGNOSIS — D638 Anemia in other chronic diseases classified elsewhere: Secondary | ICD-10-CM | POA: Diagnosis present

## 2015-11-23 DIAGNOSIS — R0602 Shortness of breath: Secondary | ICD-10-CM | POA: Diagnosis not present

## 2015-11-23 LAB — I-STAT TROPONIN, ED: TROPONIN I, POC: 0.17 ng/mL — AB (ref 0.00–0.08)

## 2015-11-23 LAB — BASIC METABOLIC PANEL
ANION GAP: 16 — AB (ref 5–15)
BUN: 32 mg/dL — AB (ref 6–20)
CO2: 31 mmol/L (ref 22–32)
Calcium: 8.8 mg/dL — ABNORMAL LOW (ref 8.9–10.3)
Chloride: 88 mmol/L — ABNORMAL LOW (ref 101–111)
Creatinine, Ser: 1.95 mg/dL — ABNORMAL HIGH (ref 0.44–1.00)
GFR calc Af Amer: 33 mL/min — ABNORMAL LOW (ref >60)
GFR, EST NON AFRICAN AMERICAN: 28 mL/min — AB (ref >60)
Glucose, Bld: 424 mg/dL — ABNORMAL HIGH (ref 65–99)
POTASSIUM: 4.1 mmol/L (ref 3.5–5.1)
SODIUM: 135 mmol/L (ref 135–145)

## 2015-11-23 LAB — CBC
HCT: 34.1 % — ABNORMAL LOW (ref 36.0–46.0)
Hemoglobin: 10.8 g/dL — ABNORMAL LOW (ref 12.0–15.0)
MCH: 29.3 pg (ref 26.0–34.0)
MCHC: 31.7 g/dL (ref 30.0–36.0)
MCV: 92.7 fL (ref 78.0–100.0)
PLATELETS: 395 10*3/uL (ref 150–400)
RBC: 3.68 MIL/uL — ABNORMAL LOW (ref 3.87–5.11)
RDW: 14.4 % (ref 11.5–15.5)
WBC: 11 10*3/uL — AB (ref 4.0–10.5)

## 2015-11-23 LAB — GLUCOSE, CAPILLARY: GLUCOSE-CAPILLARY: 294 mg/dL — AB (ref 65–99)

## 2015-11-23 LAB — BRAIN NATRIURETIC PEPTIDE: B Natriuretic Peptide: 523.5 pg/mL — ABNORMAL HIGH (ref 0.0–100.0)

## 2015-11-23 MED ORDER — ALBUTEROL SULFATE (2.5 MG/3ML) 0.083% IN NEBU
5.0000 mg | INHALATION_SOLUTION | Freq: Once | RESPIRATORY_TRACT | Status: AC
  Administered 2015-11-23: 5 mg via RESPIRATORY_TRACT
  Filled 2015-11-23: qty 6

## 2015-11-23 MED ORDER — FUROSEMIDE 10 MG/ML IJ SOLN
80.0000 mg | Freq: Once | INTRAMUSCULAR | Status: AC
  Administered 2015-11-23: 80 mg via INTRAVENOUS
  Filled 2015-11-23: qty 8

## 2015-11-23 NOTE — Progress Notes (Signed)
Pt arrived at cardiac rehab c/o tight congested cough x2 days.  Non productive.  Pt lungs scattered wheezes on right.  O2 sat-92% RA at rest.  CBG-294.  Sinus rhythm.   Pt did not exercise. PC to Dr. [name].  Pt instructed work in appt available with Dr. [name] today or tomorrow.  Medication list reconciled.  Pt is currently taking coreg 6.25mg  BID awaiting new rx to increase dose.  Pt has completed antibiotics previously prescribed for bronchitis.  Pt taking albuterol inhaler q4 hours PRN.  Pt did not have time to use nebulizer today, however instructed to use upon arrival home.  Pt instructed to withhold exercise until cleared by Dr. [name].   Pt verbalized understanding.

## 2015-11-23 NOTE — ED Notes (Signed)
Taken to xray.

## 2015-11-23 NOTE — ED Provider Notes (Signed)
CSN: [id number]     Arrival date & time 11/23/15  2215 History  By signing my name below, I, Altamease Oiler, attest that this documentation has been prepared under the direction and in the presence of Delora Fuel, MD. Electronically Signed: Altamease Oiler, ED Scribe. 11/23/2015. 11:49 PM   Chief Complaint  Patient presents with  . Shortness of Breath    The history is provided by the patient. No language interpreter was used.   [name] is a 55 y.o. female with PMHx of SOB, CHF, CAD, a-fib, cardiomyopathy,  HTN, DM, and HLD who presents to the Emergency Department complaining of SOB with gradual onset last night. The shortness of breath is worse with laying flat and improved with moving around. Alka Seltzer Cold Plus provided no relief in SOB at home.  Associated symptoms include sweating, anxiety, and nausea. Pt denies increased LE swelling, chest pain, and vomiting.   Past Medical History  Diagnosis Date  . Alcohol abuse   . Narcotic abuse   . Cocaine abuse   . Marijuana abuse   . Tobacco abuse   . Alcoholic cirrhosis ([place])   . Cardiomyopathy ([place])   . Obesity   . Chronic combined systolic and diastolic CHF (congestive heart failure) ([place])     a. Last echo 12/205: EF 40-45%, inferoapical/posterior HK, not technically sufficient to allow eval of LV diastolic function, normal LA in size..  . Hypothyroidism   . GERD (gastroesophageal reflux disease)   . PAF (paroxysmal atrial fibrillation) ([place])   . Critical lower limb ischemia   . Hypertension   . CAD (coronary artery disease)     a. cath 11/10/2014 LM nl, LAD min irregs, D1 30 ost, D2 50d, LCX 88m OM1 80 p/m (1.5 mm vessel), OM2 446mRCA nondom 9085mmed rx.. Demand ischemia in the setting of rapid a-fib.  . AMarland Kitchenxiety   . Poorly controlled type 2 diabetes mellitus ([place]CC[place] . Hyperlipemia   . Peripheral arterial disease ([place]   a. 01/2015 Angio/PTA: LSFA 100 w/ recon @ adductor canal and 1 vessel runoff via AT, RSFA 99  (atherectomy/pta) - 1 vessel runoff via diff dzs peroneal.  . Carotid artery disease ([place]CC[place]   a. 01/2015 Carotid Angio: RICA [id number] L carotid endarterectomy 02/2015.  . CMarland KitchenPD (chronic obstructive pulmonary disease) (HCC[place] . Diabetic peripheral neuropathy ([place]CC[place] . Paroxysmal atrial tachycardia (HCC[place] . Noncompliance   . Anemia   . B12 deficiency   . Alcoholic cirrhosis ([place]CC[place] . NSVT (nonsustained ventricular tachycardia) ([place]CC[place] . Paroxysmal atrial tachycardia (HCC[place] . Hypokalemia   . Hypomagnesemia   . Renal insufficiency     a. 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  . EMarland Kitchenevated troponin     a. Chronic elevation.   Past Surgical History  Procedure Laterality Date  . Cardioversion  ~ 02/2013    "twice"   . Lower extremity angiogram N/A 09/10/2013    Procedure: LOWER EXTREMITY ANGIOGRAM;  Surgeon: JonLorretta HarpD;  [facility];  Service: Cardiovascular;  Laterality: N/A;  . Left heart catheterization with coronary angiogram N/A 10/31/2014    Procedure: LEFT HEART CATHETERIZATION WITH CORONARY ANGIOGRAM;  Surgeon: ChrBurnell BlanksD;  [facility];  Service: Cardiovascular;  Laterality: N/A;  . Peripheral athrectomy Right 01/15/2015    SFA/notes 01/15/2015  . Balloon angioplasty, artery Right 01/15/2015    SFA/notes 01/15/2015  . Cardiac catheterization    .  Lower extremity angiogram N/A 01/15/2015    Procedure: LOWER EXTREMITY ANGIOGRAM;  Surgeon: [name], MD;  [facility];  Service: Cardiovascular;  Laterality: N/A;  . Carotid angiogram N/A 01/15/2015    Procedure: CAROTID ANGIOGRAM;  Surgeon: [name], MD;  Location[facility] C[facility] LAB;  Service: Cardiovascular;  Laterality: N/A;  . Endarterectomy Left 02/19/2015    Procedure: LEFT CAROTID ENDARTERECTOMY ;  Surgeon: Serafina Mitchell, MD;  Location: [facility] OR;  Service: Vascular;  Laterality: Left;  . Dilation and curettage of uterus  1988   Family History  Problem Relation Age of Onset  .  Hypertension Mother   . Diabetes Mother   . Cancer Mother     breast, ovarian, colon  . Clotting disorder Mother   . Heart disease Mother   . Heart attack Mother   . Hypertension Father   . Heart disease Father   . Emphysema Sister     smoked   Social History  Substance Use Topics  . Smoking status: Current Some Day Smoker -- 0.00 packs/day for 40 years    Types: E-cigarettes  . Smokeless tobacco: Never Used  . Alcohol Use: No   OB History    Gravida Para Term Preterm AB TAB SAB Ectopic Multiple Living   _0 Review of Systems  Constitutional: Positive for diaphoresis.  Respiratory: Positive for shortness of breath.   Cardiovascular: Negative for chest pain.  Gastrointestinal: Positive for nausea. Negative for vomiting.  Psychiatric/Behavioral: The patient is nervous/anxious.   All other systems reviewed and are negative.  Allergies  Amiodarone  Home Medications   Prior to Admission medications   Medication Sig Start Date End Date Taking? Authorizing Provider  acetaminophen (TYLENOL) 500 MG tablet Take 1,000 mg by mouth every 6 (six) hours as needed (pain).    Historical Provider, MD  acetaminophen-codeine (TYLENOL #3) 300-30 MG tablet Take 1 tablet by mouth every 4 (four) hours as needed for moderate pain.    Historical Provider, MD  albuterol (PROVENTIL HFA;VENTOLIN HFA) 108 (90 BASE) MCG/ACT inhaler Inhale 2 puffs into the lungs every 6 (six) hours as needed for wheezing or shortness of breath. 06/30/15   Arnoldo Morale, MD  albuterol (PROVENTIL) (2.5 MG/3ML) 0.083% nebulizer solution Take 3 mLs (2.5 mg total) by nebulization every 6 (six) hours as needed. For wheezing. Patient taking differently: Take 2.5 mg by nebulization every 6 (six) hours as needed.  06/30/15   Arnoldo Morale, MD  atorvastatin (LIPITOR) 40 MG tablet Take 1 tablet (40 mg total) by mouth daily. 06/30/15   Arnoldo Morale, MD  Blood Glucose Monitoring Suppl (ACCU-CHEK AVIVA PLUS) W/DEVICE KIT 1  Device by Does not apply route 4 (four) times daily -  before meals and at bedtime. 07/21/15   Arnoldo Morale, MD  carvedilol (COREG) 12.5 MG tablet Take 1 tablet (12.5 mg total) by mouth 2 (two) times daily with a meal. Patient taking differently: Take 12.5 mg by mouth 2 (two) times daily with a meal.  11/17/15   Dayna N Dunn, PA-C  cefUROXime (CEFTIN) 500 MG tablet Take 1 tablet (500 mg total) by mouth 2 (two) times daily with a meal. Patient not taking: Reported on 11/18/2015 11/09/15   Brittainy Erie Noe, PA-C  Cholecalciferol (VITAMIN D) 2000 UNITS tablet Take 2,000 Units by mouth daily.    Historical Provider, MD  clopidogrel (PLAVIX) 75 MG tablet Take 1 tablet (75 mg  total) by mouth daily with breakfast. 06/30/15   Arnoldo Morale, MD  diltiazem (CARDIZEM CD) 180 MG 24 hr capsule Take 1 capsule (180 mg total) by mouth daily. 10/23/15   Eileen Stanford, PA-C  folic acid (FOLVITE) 1 MG tablet Take 1 tablet (1 mg total) by mouth daily. 11/09/15   Brittainy Erie Noe, PA-C  furosemide (LASIX) 40 MG tablet Take 1.5 tablets (60 mg total) by mouth 2 (two) times daily. 11/17/15   Dayna N Dunn, PA-C  Gabapentin, Once-Daily, (GRALISE) 300 MG TABS Take 300 mg by mouth 4 (four) times daily.     Historical Provider, MD  glipiZIDE (GLUCOTROL XL) 10 MG 24 hr tablet Take 2 tablets (20 mg total) by mouth daily with breakfast. 06/30/15   Arnoldo Morale, MD  glucose blood (ACCU-CHEK AVIVA PLUS) test strip Use as instructed 07/21/15   Arnoldo Morale, MD  insulin aspart (NOVOLOG) 100 UNIT/ML FlexPen Sliding scale Patient taking differently: Inject 8-14 Units into the skin 3 (three) times daily with meals. Per sliding scale 06/30/15   Arnoldo Morale, MD  insulin glargine (LANTUS) 100 unit/mL SOPN Inject 60 Units into the skin at bedtime.    Historical Provider, MD  Insulin Pen Needle (ULTICARE MICRO PEN NEEDLES) 32G X 4 MM MISC 1 Syringe by Does not apply route 4 (four) times daily - after meals and at bedtime. 06/30/15   Arnoldo Morale,  MD  Lancets (ACCU-CHEK SOFT TOUCH) lancets Use as instructed 07/21/15   Arnoldo Morale, MD  levothyroxine (SYNTHROID, LEVOTHROID) 50 MCG tablet Take 1 tablet (50 mcg total) by mouth daily. 06/30/15   Arnoldo Morale, MD  lisinopril (PRINIVIL,ZESTRIL) 20 MG tablet Take 20 mg by mouth daily.    Historical Provider, MD  metFORMIN (GLUCOPHAGE) 500 MG tablet Take 500 mg by mouth 3 (three) times daily with meals.     Historical Provider, MD  nitroGLYCERIN (NITROSTAT) 0.4 MG SL tablet Place 1 tablet (0.4 mg total) under the tongue every 5 (five) minutes as needed for chest pain. 01/16/15   Rogelia Mire, NP  pantoprazole (PROTONIX) 40 MG tablet Take 1 tablet (40 mg total) by mouth daily. 09/22/15 09/21/16  Arnoldo Morale, MD  rivaroxaban (XARELTO) 20 MG TABS tablet Take 1 tablet (20 mg total) by mouth daily with supper. Patient taking differently: Take 20 mg by mouth at bedtime.  06/30/15   Arnoldo Morale, MD  vitamin B-12 1000 MCG tablet Take 1 tablet (1,000 mcg total) by mouth daily. 11/10/15   Brittainy M Simmons, PA-C   BP 173/90 mmHg  Pulse 116  Temp(Src) 98.4 F (36.9 C) (Oral)  Resp 32  Ht _0  (1.626 m)  Wt 254 lb (115.214 kg)  BMI 43.58 kg/m2  SpO2 96%  LMP 11/27/2012 Physical Exam  Constitutional: She is oriented to person, place, and time. She appears well-developed and well-nourished. No distress.  Appears dyspneic  HENT:  Head: Normocephalic and atraumatic.  Eyes: EOM are normal. Pupils are equal, round, and reactive to light.  Neck: Normal range of motion. Neck supple. No JVD present.  Cardiovascular: Regular rhythm and normal heart sounds.   No murmur heard. Tachycardic  Pulmonary/Chest: Effort normal. She has wheezes. She has rales. She exhibits no tenderness.  Coarse expiratory wheezes Fine bibasilar rales   Abdominal: Soft. Bowel sounds are normal. She exhibits no distension and no mass. There is no tenderness.  Musculoskeletal: Normal range of motion. She exhibits edema.  3+  Pretibial edema, 1+ presacral edema   Lymphadenopathy:  She has no cervical adenopathy.  Neurological: She is alert and oriented to person, place, and time. No cranial nerve deficit. She exhibits normal muscle tone. Coordination normal.  Skin: Skin is warm and dry. No rash noted.  Psychiatric: She has a normal mood and affect. Her behavior is normal. Judgment and thought content normal.  Nursing note and vitals reviewed.   ED Course  Procedures (including critical care time) DIAGNOSTIC STUDIES: Oxygen Saturation is 96% on 4L,  adequate by my interpretation.    COORDINATION OF CARE: 11:14 PM Discussed treatment plan which includes lab work, CXR, EKG, a breathing treatment ,and Lasix with pt at bedside and pt agreed to plan.  11:46 PM-Consult complete with Cardiology. Patient case explained and discussed. Call ended at 11:48 PM   Labs Review Results for orders placed or performed during the hospital encounter of 02/40/97  Basic metabolic panel  Result Value Ref Range   Sodium 135 135 - 145 mmol/L   Potassium 4.1 3.5 - 5.1 mmol/L   Chloride 88 (L) 101 - 111 mmol/L   CO2 31 22 - 32 mmol/L   Glucose, Bld 424 (H) 65 - 99 mg/dL   BUN 32 (H) 6 - 20 mg/dL   Creatinine, Ser 1.95 (H) 0.44 - 1.00 mg/dL   Calcium 8.8 (L) 8.9 - 10.3 mg/dL   GFR calc non Af Amer 28 (L) >60 mL/min   GFR calc Af Amer 33 (L) >60 mL/min   Anion gap 16 (H) 5 - 15  CBC  Result Value Ref Range   WBC 11.0 (H) 4.0 - 10.5 K/uL   RBC 3.68 (L) 3.87 - 5.11 MIL/uL   Hemoglobin 10.8 (L) 12.0 - 15.0 g/dL   HCT 34.1 (L) 36.0 - 46.0 %   MCV 92.7 78.0 - 100.0 fL   MCH 29.3 26.0 - 34.0 pg   MCHC 31.7 30.0 - 36.0 g/dL   RDW 14.4 11.5 - 15.5 %   Platelets 395 150 - 400 K/uL  Brain natriuretic peptide  Result Value Ref Range   B Natriuretic Peptide 523.5 (H) 0.0 - 100.0 pg/mL  Comprehensive metabolic panel  Result Value Ref Range   Sodium 137 135 - 145 mmol/L   Potassium 3.9 3.5 - 5.1 mmol/L   Chloride 96 (L) 101 -  111 mmol/L   CO2 31 22 - 32 mmol/L   Glucose, Bld 342 (H) 65 - 99 mg/dL   BUN 33 (H) 6 - 20 mg/dL   Creatinine, Ser 1.50 (H) 0.44 - 1.00 mg/dL   Calcium 7.9 (L) 8.9 - 10.3 mg/dL   Total Protein 6.8 6.5 - 8.1 g/dL   Albumin 3.0 (L) 3.5 - 5.0 g/dL   AST 41 15 - 41 U/L   ALT 54 14 - 54 U/L   Alkaline Phosphatase 216 (H) 38 - 126 U/L   Total Bilirubin 0.6 0.3 - 1.2 mg/dL   GFR calc non Af Amer 39 (L) >60 mL/min   GFR calc Af Amer 45 (L) >60 mL/min   Anion gap 10 5 - 15  CBC with Differential/Platelet  Result Value Ref Range   WBC 10.2 4.0 - 10.5 K/uL   RBC 3.16 (L) 3.87 - 5.11 MIL/uL   Hemoglobin 9.1 (L) 12.0 - 15.0 g/dL   HCT 29.1 (L) 36.0 - 46.0 %   MCV 92.1 78.0 - 100.0 fL   MCH 28.8 26.0 - 34.0 pg   MCHC 31.3 30.0 - 36.0 g/dL   RDW 14.3 11.5 - 15.5 %  Platelets 290 150 - 400 K/uL   Neutrophils Relative % 82 %   Neutro Abs 8.4 (H) 1.7 - 7.7 K/uL   Lymphocytes Relative 13 %   Lymphs Abs 1.3 0.7 - 4.0 K/uL   Monocytes Relative 5 %   Monocytes Absolute 0.5 0.1 - 1.0 K/uL   Eosinophils Relative 0 %   Eosinophils Absolute 0.0 0.0 - 0.7 K/uL   Basophils Relative 0 %   Basophils Absolute 0.0 0.0 - 0.1 K/uL  Glucose, capillary  Result Value Ref Range   Glucose-Capillary 330 (H) 65 - 99 mg/dL   Comment 1 Notify RN    Comment 2 Document in Chart   Glucose, capillary  Result Value Ref Range   Glucose-Capillary 308 (H) 65 - 99 mg/dL   Comment 1 Notify RN    Comment 2 Document in Chart   I-stat troponin, ED (not at [facility], [facility])  Result Value Ref Range   Troponin i, poc 0.17 (HH) 0.00 - 0.08 ng/mL   Comment NOTIFIED PHYSICIAN    Comment 3          I-stat troponin, ED  Result Value Ref Range   Troponin i, poc 0.19 (HH) 0.00 - 0.08 ng/mL   Comment NOTIFIED PHYSICIAN    Comment 3          I-Stat arterial blood gas, ED  Result Value Ref Range   pH, Arterial 7.400 7.350 - 7.450   pCO2 arterial 55.9 (H) 35.0 - 45.0 mmHg   pO2, Arterial 92.0 80.0 - 100.0 mmHg   Bicarbonate 34.6  (H) 20.0 - 24.0 mEq/L   TCO2 36 0 - 100 mmol/L   O2 Saturation 97.0 %   Acid-Base Excess 8.0 (H) 0.0 - 2.0 mmol/L   Patient temperature 98.6 F    Collection site RADIAL, ALLEN'S TEST ACCEPTABLE    Drawn by Operator    Sample type ARTERIAL    Imaging Review Dg Chest 2 View  11/23/2015  CLINICAL DATA:  Shortness of breath. EXAM: CHEST  2 VIEW COMPARISON:  November 18, 2015. FINDINGS: The heart size and mediastinal contours are within normal limits. No pneumothorax pleural effusion is noted. Mildly increased bibasilar interstitial opacities are noted concerning for edema or subsegmental atelectasis. The visualized skeletal structures are unremarkable. IMPRESSION: Mild bibasilar subsegmental atelectasis or possibly edema. Electronically Signed   By: Marijo Conception, M.D.   On: 11/23/2015 23:23   I have personally reviewed and evaluated these images and lab results as part of my medical decision-making.   EKG Interpretation   Date/Time:  Monday November 23 2015 22:26:03 EST Ventricular Rate:  117 PR Interval:    QRS Duration: 86 QT Interval:  312 QTC Calculation: 435 R Axis:   50 Text Interpretation:  Accelerated Sinus tachycardia Marked ST abnormality,  possible inferior subendocardial injury Abnormal ECG When compared with  ECG of 11/18/2015, No significant change was found Confirmed by [facility]  MD,  Renso Swett ([id number]) on 11/23/2015 11:11:42 PM      MDM   Final diagnoses:  Acute on chronic combined systolic and diastolic congestive heart failure ([name])  Elevated troponin  Acute kidney injury (nontraumatic) ([name])  Normochromic normocytic anemia    Acute dyspnea and patient with known history of combined heart failure. Exam is consistent with CHF. Rhonchi and rales are suggestive of a mild pulmonary edema. Old records reviewed in she was in emergency department on January 4 at which time troponin was elevated but felt to be elevated on a chronic  basis. She was admitted for CHF exacerbation on  December 24. Today, BNP is elevated over baseline and getting creatinine are elevated over baseline. She's given a dose of furosemide. Troponin is elevated but lower than it was on January 4. This will need to be trended. Case is discussed with Dr. Karlyn Agee of cardiology service who agrees to admit the patient.  I personally performed the services described in this documentation, which was scribed in my presence. The recorded information has been reviewed and is accurate.      Delora Fuel, MD 37/54/36 [id number]

## 2015-11-23 NOTE — ED Notes (Signed)
Upon arrival to triage patient's O2 sats were reading 80% on RA. After oxygen applied, patient's O2 saturation recovered quickly. Currently patient alert and oriented.

## 2015-11-23 NOTE — ED Notes (Signed)
Patient here with increased shortness of breath over the past couple days. Hx of COPD. Noted to have increased work of breathing in triage with audible wheezing at bedside. Denies fever. Endorses productive cough.

## 2015-11-24 ENCOUNTER — Encounter (HOSPITAL_COMMUNITY): Payer: Self-pay | Admitting: *Deleted

## 2015-11-24 ENCOUNTER — Inpatient Hospital Stay (HOSPITAL_COMMUNITY): Payer: Medicare Other

## 2015-11-24 DIAGNOSIS — E039 Hypothyroidism, unspecified: Secondary | ICD-10-CM | POA: Diagnosis present

## 2015-11-24 DIAGNOSIS — I5023 Acute on chronic systolic (congestive) heart failure: Secondary | ICD-10-CM | POA: Diagnosis not present

## 2015-11-24 DIAGNOSIS — K7030 Alcoholic cirrhosis of liver without ascites: Secondary | ICD-10-CM | POA: Diagnosis present

## 2015-11-24 DIAGNOSIS — F17200 Nicotine dependence, unspecified, uncomplicated: Secondary | ICD-10-CM | POA: Diagnosis present

## 2015-11-24 DIAGNOSIS — F419 Anxiety disorder, unspecified: Secondary | ICD-10-CM | POA: Diagnosis present

## 2015-11-24 DIAGNOSIS — I2510 Atherosclerotic heart disease of native coronary artery without angina pectoris: Secondary | ICD-10-CM | POA: Diagnosis present

## 2015-11-24 DIAGNOSIS — R062 Wheezing: Secondary | ICD-10-CM | POA: Diagnosis not present

## 2015-11-24 DIAGNOSIS — I4891 Unspecified atrial fibrillation: Secondary | ICD-10-CM

## 2015-11-24 DIAGNOSIS — N184 Chronic kidney disease, stage 4 (severe): Secondary | ICD-10-CM | POA: Diagnosis not present

## 2015-11-24 DIAGNOSIS — N183 Chronic kidney disease, stage 3 (moderate): Secondary | ICD-10-CM | POA: Diagnosis present

## 2015-11-24 DIAGNOSIS — Z794 Long term (current) use of insulin: Secondary | ICD-10-CM | POA: Diagnosis not present

## 2015-11-24 DIAGNOSIS — J9601 Acute respiratory failure with hypoxia: Secondary | ICD-10-CM

## 2015-11-24 DIAGNOSIS — D649 Anemia, unspecified: Secondary | ICD-10-CM | POA: Diagnosis present

## 2015-11-24 DIAGNOSIS — I509 Heart failure, unspecified: Secondary | ICD-10-CM

## 2015-11-24 DIAGNOSIS — J209 Acute bronchitis, unspecified: Secondary | ICD-10-CM | POA: Diagnosis present

## 2015-11-24 DIAGNOSIS — J9600 Acute respiratory failure, unspecified whether with hypoxia or hypercapnia: Secondary | ICD-10-CM | POA: Diagnosis not present

## 2015-11-24 DIAGNOSIS — J440 Chronic obstructive pulmonary disease with acute lower respiratory infection: Secondary | ICD-10-CM | POA: Diagnosis present

## 2015-11-24 DIAGNOSIS — J45909 Unspecified asthma, uncomplicated: Secondary | ICD-10-CM | POA: Diagnosis present

## 2015-11-24 DIAGNOSIS — D638 Anemia in other chronic diseases classified elsewhere: Secondary | ICD-10-CM | POA: Diagnosis present

## 2015-11-24 DIAGNOSIS — R7989 Other specified abnormal findings of blood chemistry: Secondary | ICD-10-CM | POA: Diagnosis not present

## 2015-11-24 DIAGNOSIS — R0602 Shortness of breath: Secondary | ICD-10-CM | POA: Diagnosis present

## 2015-11-24 DIAGNOSIS — J9602 Acute respiratory failure with hypercapnia: Secondary | ICD-10-CM | POA: Diagnosis not present

## 2015-11-24 DIAGNOSIS — J441 Chronic obstructive pulmonary disease with (acute) exacerbation: Secondary | ICD-10-CM

## 2015-11-24 DIAGNOSIS — N179 Acute kidney failure, unspecified: Secondary | ICD-10-CM

## 2015-11-24 DIAGNOSIS — I429 Cardiomyopathy, unspecified: Secondary | ICD-10-CM | POA: Diagnosis present

## 2015-11-24 DIAGNOSIS — I130 Hypertensive heart and chronic kidney disease with heart failure and stage 1 through stage 4 chronic kidney disease, or unspecified chronic kidney disease: Secondary | ICD-10-CM | POA: Diagnosis present

## 2015-11-24 DIAGNOSIS — E785 Hyperlipidemia, unspecified: Secondary | ICD-10-CM | POA: Diagnosis present

## 2015-11-24 DIAGNOSIS — E1151 Type 2 diabetes mellitus with diabetic peripheral angiopathy without gangrene: Secondary | ICD-10-CM | POA: Diagnosis present

## 2015-11-24 DIAGNOSIS — J9621 Acute and chronic respiratory failure with hypoxia: Secondary | ICD-10-CM | POA: Diagnosis present

## 2015-11-24 DIAGNOSIS — E1165 Type 2 diabetes mellitus with hyperglycemia: Secondary | ICD-10-CM | POA: Diagnosis present

## 2015-11-24 DIAGNOSIS — I480 Paroxysmal atrial fibrillation: Secondary | ICD-10-CM | POA: Diagnosis present

## 2015-11-24 DIAGNOSIS — I5043 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) and diastolic (congestive) heart failure: Secondary | ICD-10-CM | POA: Diagnosis present

## 2015-11-24 DIAGNOSIS — E1122 Type 2 diabetes mellitus with diabetic chronic kidney disease: Secondary | ICD-10-CM | POA: Diagnosis present

## 2015-11-24 DIAGNOSIS — I248 Other forms of acute ischemic heart disease: Secondary | ICD-10-CM | POA: Diagnosis not present

## 2015-11-24 DIAGNOSIS — Z6841 Body Mass Index (BMI) 40.0 and over, adult: Secondary | ICD-10-CM | POA: Diagnosis not present

## 2015-11-24 DIAGNOSIS — E1142 Type 2 diabetes mellitus with diabetic polyneuropathy: Secondary | ICD-10-CM | POA: Diagnosis present

## 2015-11-24 DIAGNOSIS — D519 Vitamin B12 deficiency anemia, unspecified: Secondary | ICD-10-CM | POA: Diagnosis present

## 2015-11-24 DIAGNOSIS — E669 Obesity, unspecified: Secondary | ICD-10-CM | POA: Diagnosis present

## 2015-11-24 DIAGNOSIS — Z7902 Long term (current) use of antithrombotics/antiplatelets: Secondary | ICD-10-CM | POA: Diagnosis not present

## 2015-11-24 LAB — POCT I-STAT 3, ART BLOOD GAS (G3+)
ACID-BASE EXCESS: 9 mmol/L — AB (ref 0.0–2.0)
BICARBONATE: 38 meq/L — AB (ref 20.0–24.0)
O2 SAT: 100 %
TCO2: 40 mmol/L (ref 0–100)
pCO2 arterial: 79.6 mmHg (ref 35.0–45.0)
pH, Arterial: 7.291 — ABNORMAL LOW (ref 7.350–7.450)
pO2, Arterial: 543 mmHg — ABNORMAL HIGH (ref 80.0–100.0)

## 2015-11-24 LAB — COMPREHENSIVE METABOLIC PANEL
ALK PHOS: 216 U/L — AB (ref 38–126)
ALT: 54 U/L (ref 14–54)
ANION GAP: 10 (ref 5–15)
AST: 41 U/L (ref 15–41)
Albumin: 3 g/dL — ABNORMAL LOW (ref 3.5–5.0)
BUN: 33 mg/dL — ABNORMAL HIGH (ref 6–20)
CALCIUM: 7.9 mg/dL — AB (ref 8.9–10.3)
CO2: 31 mmol/L (ref 22–32)
Chloride: 96 mmol/L — ABNORMAL LOW (ref 101–111)
Creatinine, Ser: 1.5 mg/dL — ABNORMAL HIGH (ref 0.44–1.00)
GFR, EST AFRICAN AMERICAN: 45 mL/min — AB (ref >60)
GFR, EST NON AFRICAN AMERICAN: 39 mL/min — AB (ref >60)
Glucose, Bld: 342 mg/dL — ABNORMAL HIGH (ref 65–99)
Potassium: 3.9 mmol/L (ref 3.5–5.1)
SODIUM: 137 mmol/L (ref 135–145)
TOTAL PROTEIN: 6.8 g/dL (ref 6.5–8.1)
Total Bilirubin: 0.6 mg/dL (ref 0.3–1.2)

## 2015-11-24 LAB — GLUCOSE, CAPILLARY
GLUCOSE-CAPILLARY: 308 mg/dL — AB (ref 65–99)
GLUCOSE-CAPILLARY: 261 mg/dL — AB (ref 65–99)
Glucose-Capillary: 330 mg/dL — ABNORMAL HIGH (ref 65–99)
Glucose-Capillary: 191 mg/dL — ABNORMAL HIGH (ref 65–99)
Glucose-Capillary: 256 mg/dL — ABNORMAL HIGH (ref 65–99)

## 2015-11-24 LAB — CBC WITH DIFFERENTIAL/PLATELET
BASOS ABS: 0 10*3/uL (ref 0.0–0.1)
Basophils Relative: 0 %
Eosinophils Absolute: 0 10*3/uL (ref 0.0–0.7)
Eosinophils Relative: 0 %
HEMATOCRIT: 29.1 % — AB (ref 36.0–46.0)
Hemoglobin: 9.1 g/dL — ABNORMAL LOW (ref 12.0–15.0)
LYMPHS PCT: 13 %
Lymphs Abs: 1.3 10*3/uL (ref 0.7–4.0)
MCH: 28.8 pg (ref 26.0–34.0)
MCHC: 31.3 g/dL (ref 30.0–36.0)
MCV: 92.1 fL (ref 78.0–100.0)
Monocytes Absolute: 0.5 10*3/uL (ref 0.1–1.0)
Monocytes Relative: 5 %
NEUTROS ABS: 8.4 10*3/uL — AB (ref 1.7–7.7)
Neutrophils Relative %: 82 %
PLATELETS: 290 10*3/uL (ref 150–400)
RBC: 3.16 MIL/uL — AB (ref 3.87–5.11)
RDW: 14.3 % (ref 11.5–15.5)
WBC: 10.2 10*3/uL (ref 4.0–10.5)

## 2015-11-24 LAB — I-STAT TROPONIN, ED: Troponin i, poc: 0.19 ng/mL (ref 0.00–0.08)

## 2015-11-24 LAB — CARBOXYHEMOGLOBIN
CARBOXYHEMOGLOBIN: 0.8 % (ref 0.5–1.5)
METHEMOGLOBIN: 0.8 % (ref 0.0–1.5)
O2 SAT: 51.5 %
Total hemoglobin: 11.1 g/dL — ABNORMAL LOW (ref 12.0–16.0)

## 2015-11-24 LAB — I-STAT ARTERIAL BLOOD GAS, ED
Acid-Base Excess: 8 mmol/L — ABNORMAL HIGH (ref 0.0–2.0)
Bicarbonate: 34.6 mEq/L — ABNORMAL HIGH (ref 20.0–24.0)
O2 Saturation: 97 %
PCO2 ART: 55.9 mmHg — AB (ref 35.0–45.0)
PH ART: 7.4 (ref 7.350–7.450)
Patient temperature: 98.6
TCO2: 36 mmol/L (ref 0–100)
pO2, Arterial: 92 mmHg (ref 80.0–100.0)

## 2015-11-24 LAB — RAPID URINE DRUG SCREEN, HOSP PERFORMED
Amphetamines: NOT DETECTED
Barbiturates: NOT DETECTED
Benzodiazepines: NOT DETECTED
Cocaine: NOT DETECTED
OPIATES: POSITIVE — AB
TETRAHYDROCANNABINOL: NOT DETECTED

## 2015-11-24 LAB — HEPARIN LEVEL (UNFRACTIONATED): HEPARIN UNFRACTIONATED: 1.07 [IU]/mL — AB (ref 0.30–0.70)

## 2015-11-24 LAB — APTT: APTT: 32 s (ref 24–37)

## 2015-11-24 LAB — TROPONIN I
TROPONIN I: 0.28 ng/mL — AB (ref <0.031)
Troponin I: 0.23 ng/mL — ABNORMAL HIGH (ref <0.031)

## 2015-11-24 MED ORDER — MIDAZOLAM HCL 2 MG/2ML IJ SOLN
2.0000 mg | Freq: Once | INTRAMUSCULAR | Status: AC
  Administered 2015-11-24: 2 mg via INTRAVENOUS

## 2015-11-24 MED ORDER — ASPIRIN EC 81 MG PO TBEC: 81.0000 mg | DELAYED_RELEASE_TABLET | Freq: Every day | ORAL | Status: DC

## 2015-11-24 MED ORDER — FENTANYL CITRATE (PF) 100 MCG/2ML IJ SOLN
INTRAMUSCULAR | Status: AC
  Administered 2015-11-24: 100 ug via INTRAVENOUS
  Filled 2015-11-24: qty 4

## 2015-11-24 MED ORDER — RIVAROXABAN 20 MG PO TABS
20.0000 mg | ORAL_TABLET | Freq: Every day | ORAL | Status: DC
  Filled 2015-11-24: qty 1

## 2015-11-24 MED ORDER — CARVEDILOL 12.5 MG PO TABS: 12.5000 mg | ORAL_TABLET | Freq: Two times a day (BID) | ORAL | Status: DC

## 2015-11-24 MED ORDER — INSULIN ASPART 100 UNIT/ML ~~LOC~~ SOLN
0.0000 [IU] | Freq: Three times a day (TID) | SUBCUTANEOUS | Status: DC
  Administered 2015-11-24: 15 [IU] via SUBCUTANEOUS
  Administered 2015-11-24: 4 [IU] via SUBCUTANEOUS

## 2015-11-24 MED ORDER — INSULIN ASPART 100 UNIT/ML ~~LOC~~ SOLN: 0.0000 [IU] | Freq: Every day | SUBCUTANEOUS | Status: DC

## 2015-11-24 MED ORDER — ACETAMINOPHEN 500 MG PO TABS: 1000.0000 mg | ORAL_TABLET | Freq: Four times a day (QID) | ORAL | Status: DC | PRN

## 2015-11-24 MED ORDER — ALBUTEROL SULFATE (2.5 MG/3ML) 0.083% IN NEBU
2.5000 mg | INHALATION_SOLUTION | Freq: Four times a day (QID) | RESPIRATORY_TRACT | Status: DC | PRN
  Administered 2015-11-26 – 2015-12-03 (×10): 2.5 mg via RESPIRATORY_TRACT
  Filled 2015-11-24 (×10): qty 3

## 2015-11-24 MED ORDER — FUROSEMIDE 10 MG/ML IJ SOLN
80.0000 mg | Freq: Once | INTRAMUSCULAR | Status: AC
  Administered 2015-11-24: 80 mg via INTRAVENOUS
  Filled 2015-11-24: qty 8

## 2015-11-24 MED ORDER — FOLIC ACID 1 MG PO TABS
1.0000 mg | ORAL_TABLET | Freq: Every day | ORAL | Status: DC
  Administered 2015-11-24 – 2015-12-03 (×10): 1 mg via ORAL
  Filled 2015-11-24 (×10): qty 1

## 2015-11-24 MED ORDER — NOREPINEPHRINE BITARTRATE 1 MG/ML IV SOLN
0.0000 ug/min | INTRAVENOUS | Status: DC
  Administered 2015-11-24: 5 ug/min via INTRAVENOUS
  Filled 2015-11-24: qty 4

## 2015-11-24 MED ORDER — INSULIN ASPART 100 UNIT/ML ~~LOC~~ SOLN
10.0000 [IU] | SUBCUTANEOUS | Status: DC
  Administered 2015-11-24 – 2015-11-27 (×16): 10 [IU] via SUBCUTANEOUS

## 2015-11-24 MED ORDER — INSULIN ASPART 100 UNIT/ML ~~LOC~~ SOLN
0.0000 [IU] | SUBCUTANEOUS | Status: DC
  Administered 2015-11-24 (×2): 11 [IU] via SUBCUTANEOUS
  Administered 2015-11-25 (×2): 4 [IU] via SUBCUTANEOUS
  Administered 2015-11-25: 7 [IU] via SUBCUTANEOUS
  Administered 2015-11-25: 4 [IU] via SUBCUTANEOUS
  Administered 2015-11-25: 7 [IU] via SUBCUTANEOUS
  Administered 2015-11-25: 4 [IU] via SUBCUTANEOUS
  Administered 2015-11-26: 7 [IU] via SUBCUTANEOUS
  Administered 2015-11-26: 3 [IU] via SUBCUTANEOUS
  Administered 2015-11-26 (×3): 4 [IU] via SUBCUTANEOUS
  Administered 2015-11-26: 3 [IU] via SUBCUTANEOUS
  Administered 2015-11-26 – 2015-11-27 (×2): 4 [IU] via SUBCUTANEOUS

## 2015-11-24 MED ORDER — FENTANYL CITRATE (PF) 100 MCG/2ML IJ SOLN
100.0000 ug | INTRAMUSCULAR | Status: DC | PRN
  Administered 2015-11-24: 100 ug via INTRAVENOUS
  Filled 2015-11-24: qty 2

## 2015-11-24 MED ORDER — GABAPENTIN 300 MG PO CAPS
300.0000 mg | ORAL_CAPSULE | Freq: Three times a day (TID) | ORAL | Status: DC
Start: 2015-11-24 — End: 2015-12-03
  Administered 2015-11-24 – 2015-12-03 (×38): 300 mg via ORAL
  Filled 2015-11-24 (×42): qty 1

## 2015-11-24 MED ORDER — LISINOPRIL 20 MG PO TABS: 20.0000 mg | ORAL_TABLET | Freq: Every day | ORAL | Status: DC

## 2015-11-24 MED ORDER — PANTOPRAZOLE SODIUM 40 MG PO TBEC
40.0000 mg | DELAYED_RELEASE_TABLET | Freq: Every day | ORAL | Status: DC
  Filled 2015-11-24: qty 1

## 2015-11-24 MED ORDER — FUROSEMIDE 10 MG/ML IJ SOLN: 80.0000 mg | Freq: Once | INTRAMUSCULAR | Status: DC

## 2015-11-24 MED ORDER — AMIODARONE HCL IN DEXTROSE 360-4.14 MG/200ML-% IV SOLN
30.0000 mg/h | INTRAVENOUS | Status: DC
  Administered 2015-11-24 – 2015-11-27 (×8): 30 mg/h via INTRAVENOUS
  Filled 2015-11-24 (×12): qty 200

## 2015-11-24 MED ORDER — FENTANYL CITRATE (PF) 100 MCG/2ML IJ SOLN
100.0000 ug | Freq: Once | INTRAMUSCULAR | Status: AC
  Administered 2015-11-24: 100 ug via INTRAVENOUS

## 2015-11-24 MED ORDER — ROCURONIUM BROMIDE 50 MG/5ML IV SOLN
50.0000 mg | Freq: Once | INTRAVENOUS | Status: AC
  Administered 2015-11-24: 50 mg via INTRAVENOUS
  Filled 2015-11-24: qty 5

## 2015-11-24 MED ORDER — CLOPIDOGREL BISULFATE 75 MG PO TABS: 75.0000 mg | ORAL_TABLET | Freq: Every day | ORAL | Status: DC

## 2015-11-24 MED ORDER — AMIODARONE LOAD VIA INFUSION
150.0000 mg | Freq: Once | INTRAVENOUS | Status: AC
  Administered 2015-11-24: 150 mg via INTRAVENOUS
  Filled 2015-11-24: qty 83.34

## 2015-11-24 MED ORDER — ALPRAZOLAM 0.25 MG PO TABS
0.2500 mg | ORAL_TABLET | Freq: Every evening | ORAL | Status: DC | PRN
  Administered 2015-11-24 – 2015-12-02 (×2): 0.25 mg via ORAL
  Filled 2015-11-24 (×2): qty 1

## 2015-11-24 MED ORDER — INSULIN ASPART 100 UNIT/ML ~~LOC~~ SOLN: 0.0000 [IU] | Freq: Three times a day (TID) | SUBCUTANEOUS | Status: DC

## 2015-11-24 MED ORDER — DOCUSATE SODIUM 100 MG PO CAPS
100.0000 mg | ORAL_CAPSULE | Freq: Two times a day (BID) | ORAL | Status: DC
  Filled 2015-11-24 (×5): qty 1

## 2015-11-24 MED ORDER — NITROGLYCERIN IN D5W 200-5 MCG/ML-% IV SOLN
0.0000 ug/min | INTRAVENOUS | Status: DC
  Administered 2015-11-24: 20 ug/min via INTRAVENOUS

## 2015-11-24 MED ORDER — PREDNISONE 50 MG PO TABS
50.0000 mg | ORAL_TABLET | Freq: Every day | ORAL | Status: DC
  Administered 2015-11-24: 50 mg via ORAL
  Filled 2015-11-24: qty 1

## 2015-11-24 MED ORDER — LEVOTHYROXINE SODIUM 50 MCG PO TABS
50.0000 ug | ORAL_TABLET | Freq: Every day | ORAL | Status: DC
  Administered 2015-11-24 – 2015-12-03 (×10): 50 ug via ORAL
  Filled 2015-11-24 (×11): qty 1

## 2015-11-24 MED ORDER — CHLORHEXIDINE GLUCONATE 0.12% ORAL RINSE (MEDLINE KIT)
15.0000 mL | Freq: Two times a day (BID) | OROMUCOSAL | Status: DC
  Administered 2015-11-24 – 2015-11-26 (×4): 15 mL via OROMUCOSAL

## 2015-11-24 MED ORDER — MIDAZOLAM HCL 2 MG/2ML IJ SOLN
INTRAMUSCULAR | Status: AC
  Administered 2015-11-24: 2 mg via INTRAVENOUS
  Filled 2015-11-24: qty 4

## 2015-11-24 MED ORDER — ACETAMINOPHEN-CODEINE #3 300-30 MG PO TABS
1.0000 | ORAL_TABLET | ORAL | Status: DC | PRN
  Administered 2015-11-24 – 2015-12-02 (×11): 1 via ORAL
  Filled 2015-11-24 (×11): qty 1

## 2015-11-24 MED ORDER — NITROGLYCERIN IN D5W 200-5 MCG/ML-% IV SOLN
INTRAVENOUS | Status: AC
  Administered 2015-11-24: 20 ug/min via INTRAVENOUS
  Filled 2015-11-24: qty 250

## 2015-11-24 MED ORDER — INSULIN ASPART 100 UNIT/ML ~~LOC~~ SOLN: 10.0000 [IU] | Freq: Three times a day (TID) | SUBCUTANEOUS | Status: DC

## 2015-11-24 MED ORDER — MIDAZOLAM HCL 2 MG/2ML IJ SOLN
2.0000 mg | INTRAMUSCULAR | Status: DC | PRN
  Administered 2015-11-24: 2 mg via INTRAVENOUS
  Filled 2015-11-24: qty 2

## 2015-11-24 MED ORDER — CARVEDILOL 12.5 MG PO TABS
12.5000 mg | ORAL_TABLET | Freq: Two times a day (BID) | ORAL | Status: DC
  Administered 2015-11-24: 12.5 mg via ORAL
  Filled 2015-11-24: qty 1

## 2015-11-24 MED ORDER — FUROSEMIDE 10 MG/ML IJ SOLN
80.0000 mg | Freq: Two times a day (BID) | INTRAMUSCULAR | Status: DC
  Administered 2015-11-24: 80 mg via INTRAVENOUS
  Filled 2015-11-24 (×3): qty 8

## 2015-11-24 MED ORDER — PREDNISONE 20 MG PO TABS
40.0000 mg | ORAL_TABLET | Freq: Every day | ORAL | Status: DC
  Administered 2015-11-24 – 2015-11-27 (×4): 40 mg
  Filled 2015-11-24 (×6): qty 2

## 2015-11-24 MED ORDER — TIOTROPIUM BROMIDE MONOHYDRATE 18 MCG IN CAPS
18.0000 ug | ORAL_CAPSULE | Freq: Every day | RESPIRATORY_TRACT | Status: DC
  Administered 2015-11-28 – 2015-12-03 (×6): 18 ug via RESPIRATORY_TRACT
  Filled 2015-11-24 (×3): qty 5

## 2015-11-24 MED ORDER — ATORVASTATIN CALCIUM 40 MG PO TABS
40.0000 mg | ORAL_TABLET | Freq: Every day | ORAL | Status: DC
  Administered 2015-11-24 – 2015-12-03 (×10): 40 mg via ORAL
  Filled 2015-11-24 (×11): qty 1

## 2015-11-24 MED ORDER — VITAL HIGH PROTEIN PO LIQD
1000.0000 mL | ORAL | Status: DC
  Administered 2015-11-24: 1000 mL
  Administered 2015-11-25: 06:00:00
  Administered 2015-11-25 – 2015-11-26 (×2): 1000 mL
  Filled 2015-11-24 (×2): qty 1000

## 2015-11-24 MED ORDER — VITAMIN D 1000 UNITS PO TABS
2000.0000 [IU] | ORAL_TABLET | Freq: Every day | ORAL | Status: DC
  Administered 2015-11-24 – 2015-12-03 (×9): 2000 [IU] via ORAL
  Filled 2015-11-24 (×8): qty 2

## 2015-11-24 MED ORDER — INSULIN ASPART 100 UNIT/ML ~~LOC~~ SOLN
10.0000 [IU] | Freq: Three times a day (TID) | SUBCUTANEOUS | Status: DC
  Administered 2015-11-24: 10 [IU] via SUBCUTANEOUS

## 2015-11-24 MED ORDER — FUROSEMIDE 10 MG/ML IJ SOLN
60.0000 mg | Freq: Two times a day (BID) | INTRAMUSCULAR | Status: DC
  Administered 2015-11-24: 60 mg via INTRAVENOUS
  Filled 2015-11-24 (×2): qty 6

## 2015-11-24 MED ORDER — METHYLPREDNISOLONE SODIUM SUCC 125 MG IJ SOLR
60.0000 mg | INTRAMUSCULAR | Status: AC
  Administered 2015-11-24: 60 mg via INTRAVENOUS
  Filled 2015-11-24: qty 2

## 2015-11-24 MED ORDER — HEPARIN (PORCINE) IN NACL 100-0.45 UNIT/ML-% IJ SOLN
1100.0000 [IU]/h | INTRAMUSCULAR | Status: DC
  Administered 2015-11-24: 1100 [IU]/h via INTRAVENOUS
  Filled 2015-11-24 (×3): qty 250

## 2015-11-24 MED ORDER — SENNA 8.6 MG PO TABS
1.0000 | ORAL_TABLET | Freq: Two times a day (BID) | ORAL | Status: DC
  Administered 2015-11-24 – 2015-11-25 (×3): 8.6 mg via ORAL
  Filled 2015-11-24 (×4): qty 1

## 2015-11-24 MED ORDER — INSULIN GLARGINE 100 UNIT/ML ~~LOC~~ SOLN
60.0000 [IU] | Freq: Every day | SUBCUTANEOUS | Status: DC
  Administered 2015-11-24 – 2015-11-26 (×4): 60 [IU] via SUBCUTANEOUS
  Filled 2015-11-24 (×7): qty 0.6

## 2015-11-24 MED ORDER — CLOPIDOGREL BISULFATE 75 MG PO TABS
75.0000 mg | ORAL_TABLET | Freq: Every day | ORAL | Status: DC
  Administered 2015-11-24 – 2015-11-25 (×2): 75 mg via ORAL
  Filled 2015-11-24 (×3): qty 1

## 2015-11-24 MED ORDER — SODIUM CHLORIDE 0.9 % IV SOLN
25.0000 ug/h | INTRAVENOUS | Status: DC
  Administered 2015-11-24 (×2): 100 ug/h via INTRAVENOUS
  Filled 2015-11-24 (×2): qty 50

## 2015-11-24 MED ORDER — ALBUTEROL SULFATE (2.5 MG/3ML) 0.083% IN NEBU
2.5000 mg | INHALATION_SOLUTION | Freq: Four times a day (QID) | RESPIRATORY_TRACT | Status: DC | PRN
Start: 2015-11-24 — End: 2015-11-24

## 2015-11-24 MED ORDER — SODIUM CHLORIDE 0.9 % IJ SOLN: 3.0000 mL | Freq: Two times a day (BID) | INTRAMUSCULAR | Status: DC

## 2015-11-24 MED ORDER — ETOMIDATE 2 MG/ML IV SOLN
20.0000 mg | Freq: Once | INTRAVENOUS | Status: AC
  Administered 2015-11-24: 20 mg via INTRAVENOUS

## 2015-11-24 MED ORDER — LEVOFLOXACIN IN D5W 500 MG/100ML IV SOLN
500.0000 mg | Freq: Every day | INTRAVENOUS | Status: DC
  Administered 2015-11-24 – 2015-11-25 (×2): 500 mg via INTRAVENOUS
  Filled 2015-11-24 (×2): qty 100

## 2015-11-24 MED ORDER — AMIODARONE HCL IN DEXTROSE 360-4.14 MG/200ML-% IV SOLN
60.0000 mg/h | INTRAVENOUS | Status: AC
  Administered 2015-11-24 (×2): 60 mg/h via INTRAVENOUS
  Filled 2015-11-24 (×2): qty 200

## 2015-11-24 MED ORDER — ANTISEPTIC ORAL RINSE SOLUTION (CORINZ)
7.0000 mL | Freq: Four times a day (QID) | OROMUCOSAL | Status: DC
  Administered 2015-11-24 – 2015-11-26 (×8): 7 mL via OROMUCOSAL

## 2015-11-24 NOTE — Progress Notes (Signed)
Advanced Heart Failure Rounding Note   Subjective:    Admitted this morning with increased dyspnea on exertion. Received 80 mg IV lasix last night. Called by nursing staff this morning for A fib RVR, increased SOB. Given additional 140 mg IV lasix.Given 150 mg amio bolus and started on amio drip 60 mg per hour. SBP 160s. Started on nitro drip 20 mcg per hour. Placed on BiPap.   Most recent cath [facility] 10/31/2014-Large caliber vessel with 30% mid stenosis. The first obtuse marginal branch is a small caliber vessel (1.5 mm) with diffuse 80% stenosis in the proximal and mid segment. The second obtuse marginal branch is small in caliber with 40% mid stenosis. The left posterolateral branch has mild diffuse plaque -   Objective:   Weight Range:  Vital Signs:   Temp:  [98 F (36.7 C)-98.6 F (37 C)] 98 F (36.7 C) (01/10 0733) Pulse Rate:  [98-168] 124 (01/10 0846) Resp:  [21-39] 22 (01/10 0733) BP: (118-214)/(71-132) 137/76 mmHg (01/10 0846) SpO2:  [94 %-100 %] 98 % (01/10 0846) FiO2 (%):  [40 %] 40 % (01/10 0857) Weight:  [229 lb 3.2 oz (103.964 kg)-254 lb (115.214 kg)] 229 lb 3.2 oz (103.964 kg) (01/10 0322) Last BM Date: 11/23/15  Weight change: Filed Weights   11/23/15 2225 11/24/15 0322  Weight: 254 lb (115.214 kg) 229 lb 3.2 oz (103.964 kg)    Intake/Output:   Intake/Output Summary (Last 24 hours) at 11/24/15 0859 Last data filed at 11/24/15 0832  Gross per 24 hour  Intake   1300 ml  Output    850 ml  Net    450 ml     Physical Exam: General:  Dyspneic at rest.  HEENT: normal Neck: supple. JVP hard to assess but appears elevated. Carotids 2+ bilat; no bruits. No lymphadenopathy or thryomegaly appreciated. Cor: PMI nondisplaced. Irregular rate & rhythm. No rubs, gallops or murmurs. Lungs: EW throughout. Using accessory muscles. Poor air movement. On BiPap.  Abdomen: soft, nontender, nondistended. No hepatosplenomegaly. No bruits or masses. Good bowel  sounds. Extremities: no cyanosis, clubbing, rash, R and LLE 2+ edema Neuro: alert & orientedx3, cranial nerves grossly intact. moves all 4 extremities w/o difficulty. Affect pleasant  Telemetry:  A fib RVR 182  Labs: Basic Metabolic Panel:  Recent Labs Lab 11/17/15 1224 11/18/15 1240 11/23/15 2234 11/24/15 0530  NA 140 139 135 137  K 4.0 4.0 4.1 3.9  CL 98 98* 88* 96*  CO2 30 28 31 31   GLUCOSE 235* 272* 424* 342*  BUN 25 17 32* 33*  CREATININE 0.99 0.90 1.95* 1.50*  CALCIUM 8.8 9.2 8.8* 7.9*  MG 1.6  --   --   --     Liver Function Tests:  Recent Labs Lab 11/17/15 1224 11/24/15 0530  AST 38* 41  ALT 48* 54  ALKPHOS 208* 216*  BILITOT 0.3 0.6  PROT 6.7 6.8  ALBUMIN 3.3* 3.0*   No results for input(s): LIPASE, AMYLASE in the last 168 hours. No results for input(s): AMMONIA in the last 168 hours.  CBC:  Recent Labs Lab 11/17/15 1224 11/18/15 1240 11/23/15 2234 11/24/15 0530  WBC 10.7* 11.4* 11.0* 10.2  NEUTROABS 7.6  --   --  8.4*  HGB 9.4* 9.8* 10.8* 9.1*  HCT 29.1* 31.0* 34.1* 29.1*  MCV 89.0 90.6 92.7 92.1  PLT 418* 379 395 290    Cardiac Enzymes:  Recent Labs Lab 11/18/15 1945  TROPONINI 0.91*    BNP: BNP (last  3 results)  Recent Labs  06/23/15 0005 10/15/15 1355 11/23/15 2234  BNP 539.4* 246.5* 523.5*    ProBNP (last 3 results) No results for input(s): PROBNP in the last 8760 hours.    Other results:  Imaging: Dg Chest 2 View  11/23/2015  CLINICAL DATA:  Shortness of breath. EXAM: CHEST  2 VIEW COMPARISON:  November 18, 2015. FINDINGS: The heart size and mediastinal contours are within normal limits. No pneumothorax pleural effusion is noted. Mildly increased bibasilar interstitial opacities are noted concerning for edema or subsegmental atelectasis. The visualized skeletal structures are unremarkable. IMPRESSION: Mild bibasilar subsegmental atelectasis or possibly edema. Electronically Signed   By: Marijo Conception, M.D.   On:  11/23/2015 23:23      Medications:     Scheduled Medications: . atorvastatin  40 mg Oral Daily  . cholecalciferol  2,000 Units Oral Daily  . clopidogrel  75 mg Oral Q breakfast  . docusate sodium  100 mg Oral BID  . folic acid  1 mg Oral Daily  . furosemide  80 mg Intravenous BID  . gabapentin  300 mg Oral TID AC & HS  . insulin aspart  0-20 Units Subcutaneous TID WC  . insulin aspart  0-5 Units Subcutaneous QHS  . insulin aspart  10 Units Subcutaneous TID WC  . insulin glargine  60 Units Subcutaneous QHS  . levofloxacin (LEVAQUIN) IV  500 mg Intravenous Daily  . levothyroxine  50 mcg Oral QAC breakfast  . pantoprazole  40 mg Oral Daily  . rivaroxaban  20 mg Oral QAC supper  . senna  1 tablet Oral BID  . tiotropium  18 mcg Inhalation Daily     Infusions: . amiodarone 60 mg/hr (11/24/15 0843)   Followed by  . amiodarone    . nitroGLYCERIN 20 mcg/min (11/24/15 0845)     PRN Medications:  acetaminophen, acetaminophen-codeine, albuterol, ALPRAZolam   Assessment/Plan :  1. Acute/Chronic Respiratory Failure Dyspneic at rest, multifactorial given COPD, volume overload, A fib RVR.   Placed on BiPap earlier. Continued to decline. PCCM consulted.  2. A fib RVR, on Xarelto- Given 150 mg amio followed by amio drip.  3. A/C systolic heart failure Volume overload. Given IV lasix 140 mg this morning. Continue 80 mg IV twice a day.  Check ECHO after intubated.  Stop BB for now. Stop lisinopril with elevated renal function.   4. COPD exacerbation- on Levaquin 5. AKI- Renal function elevated. Follow closely.  5. DM- on insulin. Continue sliding scale.  7. PAD - Multiple vascular procedures 6. Non-Obstructive CAD- most recent [facility] 10/2014 8. Cirrhosis, [place] 9. Hypothyroidism- recent TSH 0.391 11/07/2015  10 . H/O Cocaine Abuse - check urine drug screen  11. Smoker - quit 3 days ago  Transfer to ICU   Length of Stay: 0  [name] Clegg 11/24/2015, 8:59 AM  Advanced Heart  Failure Team Pager [phone] (M-F; 7a - 4p)  Please contact [facility] for night-coverage after hours (4p -7a ) and weekends on [email or web address]  Patient seen with NP, agree with the above note.  1. Acute hypoxemic respiratory failure: Suspect combination of CHF and COPD exacerbation, wheezing on my exam initially and moving air poorly.  - Treating with IV Lasix, nebs, Solumedrol IV, and antibiotics.  - Bipap started, suspect she will need intubation due to high work of breathing.  Being seen now by CCM.  2. Atrial fibrillation with RVR: History of PAF, on Xarelto at home.  Says she has not missed  any doses.  HR in 150s initially, started amiodarone gtt with fall in HR to 90s.  - Continue amiodarone gtt.  - Continue anticoagulation, will use IV heparin gtt for now as she may need procedure.  - Will tentatively plan DCCV tomorrow when more stable.  She will likely be sedated/intubated. Keep anticoagulated.  3. Acute on chronic systolic CHF: EF [id number] on last echo in 12/15.  Suspected nonischemic cardiomyopathy at that time (?ETOH or drugs), had cardiac cath in 12/15 with nonobstructive and relatively mild CAD.  She is volume overloaded on exam and hypertensive currently.  - Hold Coreg for now with wheezing and decompensated CHF.  - Hold lisinopril with AKI.  - NTG gtt for afterload reduction/hypertension.  - Lasix 80 mg IV x 1 now then 80 mg IV bid.  - Repeat echo. 4. COPD: Suspect exacerbation.  Wheezing on exam.  Nebs, stop prednison and will give IV Solumedrol.  She is on antibiotics.  No PNA on CXR.   5. PAD: Extensive PAD history.  She has been on ASA, Plavix and Xarelto at home.  From what I can tell, last peripheral PCI was in 2014.  I will stop ASA.  For now can continue Plavix but will need to discuss long-term plan for this with Dr Gwenlyn Found, suspect this can be stopped.  6. AKI: Creatinine 1.9 yesterday, 1.5 today.  Follow closely with diuresis.  Stopped ACEI.  7. Anemia: Stable compared to  past.  Follow closely.  Will stop ASA, d/w Dr Gwenlyn Found regarding need for Plavix.  8. Cirrhosis: ETOH-related per report.   45 minutes critical care time.   Loralie Champagne 11/24/2015 9:40 AM

## 2015-11-24 NOTE — Progress Notes (Signed)
Pt was having AFIB with RVR this morning after eating breakfast. Staff entered room and patient was sitting in chair. Vitals completed. Medical team notified. Rapid response notified. Pt states she was having SOB and could be heard wheezing. Pt also had mid chest pain 10/10. EKG completed. Orders for amiodarone, nitro, and bipap given and completed. 16 Fr Foley placed. Report called to receiving RN and pt transferred.

## 2015-11-24 NOTE — Progress Notes (Addendum)
Inpatient Diabetes Program Recommendations  AACE/ADA: New Consensus Statement on Inpatient Glycemic Control (2015)  Target Ranges:  Prepandial:   less than 140 mg/dL      Peak postprandial:   less than 180 mg/dL (1-2 hours)      Critically ill patients:  140 - 180 mg/dL   Results for [name], [name] (MRN [id number]) as of 11/24/2015 12:19  Ref. Range 11/24/2015 03:51 11/24/2015 06:14  Glucose-Capillary Latest Ref Range: 65-99 mg/dL 330 (H) 308 (H)     Admit with: Decompensated Heart Failure  History: DM, COPD, Cocaine Abuse, CHF  Home DM Meds: Lantus 60 units QHS       Novolog 6-14 units tid per SSI       Glipizide 20 mg daily       Metformin 500 mg tid  Current Insulin Orders: Lantus 60 units QHS      Novolog Resistant SSI (0-20 units) TID AC + HS      Novolog 10 units tidwc     -Note patient transferred to ICU today from floor.  Was Intubated this AM.  Currently NPO.  -Note Novolog SSI currently ordered TID AC + HS.  Patient received 60 mg IV Solumedrol X 1 dose today at 9am and now getting Prednisone 40 mg daily.    MD- Please consider discontinuation of current Insulin orders and Start ICU Glycemic Control Protocol Phase 2 (IV Insulin)  If you decide you do not want to start IV Insulin, please change Novolog SSI to Q4 hour coverage     --Will follow patient during hospitalization--  Wyn Quaker RN, MSN, CDE Diabetes Coordinator Inpatient Glycemic Control Team Team Pager: [phone] (8a-5p)

## 2015-11-24 NOTE — Procedures (Signed)
Central Venous Catheter Insertion Procedure Note JAMIYLA BILLIE [id number] 1961-04-12  Procedure: Insertion of Central Venous Catheter Indications: Assessment of intravascular volume, Drug and/or fluid administration and Frequent blood sampling  Procedure Details Consent: Risks of procedure as well as the alternatives and risks of each were explained to the (patient/caregiver).  Consent for procedure obtained. Time Out: Verified patient identification, verified procedure, site/side was marked, verified correct patient position, special equipment/implants available, medications/allergies/relevent history reviewed, required imaging and test results available.  Performed  Maximum sterile technique was used including antiseptics, cap, gloves, gown, hand hygiene, mask and sheet. Skin prep: Chlorhexidine; local anesthetic administered A antimicrobial bonded/coated triple lumen catheter was placed in the left internal jugular vein using the Seldinger technique. Ultrasound guidance used.Yes.   Catheter placed to 20 cm. Blood aspirated via all 3 ports and then flushed x 3. Line sutured x 2 and dressing applied.  Evaluation Blood flow good Complications: No apparent complications Patient did tolerate procedure well. Chest X-ray ordered to verify placement.  CXR: pending.  Richardson Landry Minor ACNP Maryanna Shape PCCM Pager [phone] till 3 pm If no answer page [phone] 11/24/2015, 10:34 AM   Baltazar Apo, MD, PhD 11/24/2015, 11:25 AM [facility] [phone] or if no answer [phone]

## 2015-11-24 NOTE — Progress Notes (Signed)
Patient arrived to unit from ED via stretcher. Patient alert, oriented and ambulatory with c/o anxiety and pain. Admission weight, vitals and assessment completed. Fall and safety plan reviewed with patient, patient currently refusing bed alarm. Informed patient of purpose of alarm for safety reasons, continued to refused.  Blood pressure 168/74, pulse 98, temperature 98.6 F (37 C), temperature source Oral, resp. rate 26, height 5\' 4"  (1.626 m), weight 103.964 kg (229 lb 3.2 oz), last menstrual period 11/27/2012, SpO2 95 %. Tresa Endo

## 2015-11-24 NOTE — Progress Notes (Signed)
  Echocardiogram 2D Echocardiogram has been performed.  [name] 11/24/2015, 4:55 PM

## 2015-11-24 NOTE — Progress Notes (Signed)
Initial Nutrition Assessment  DOCUMENTATION CODES:   Obesity unspecified  INTERVENTION:    Initiate TF via OGT with Vital High Protein at 25 ml/h and Prostat 30 ml BID on day 1; on day 2, increase to goal rate of 55 ml/h (1320 ml per day) to provide 1320 kcals, 116 gm protein, 1104 ml free water daily.  NUTRITION DIAGNOSIS:   Inadequate oral intake related to inability to eat as evidenced by NPO status.  GOAL:   Provide needs based on ASPEN/SCCM guidelines  MONITOR:   Vent status, Labs, Weight trends, TF tolerance, I & O's  REASON FOR ASSESSMENT:   Consult Enteral/tube feeding initiation and management  ASSESSMENT:   54yoF with hx of HTN, DM2, HLD, active smoking, PAD, Carotid disease, nonobstructive coronary disease, non-ischemic cardiomyopathy with chronic elevated troponins, EF 40%, COPD, presents with 2 days of worsening sob in setting of recent increased sputum production and increased cough.  Nutrition focused physical exam completed.  No muscle or subcutaneous fat depletion noticed. Received MD Consult for TF initiation and management. OGT in place.  Patient is currently intubated on ventilator support MV: 11.2 L/min Temp (24hrs), Avg:98.8 F (37.1 C), Min:98 F (36.7 C), Max:100 F (37.8 C)   Diet Order:  Diet heart healthy/carb modified Room service appropriate?: Yes; Fluid consistency:: Thin  Skin:  Reviewed, no issues  Last BM:  1/9  Height:   Ht Readings from Last 1 Encounters:  11/24/15 5\' 4"  (1.626 m)    Weight:   Wt Readings from Last 1 Encounters:  11/24/15 229 lb 3.2 oz (103.964 kg)    Ideal Body Weight:  54.5 kg  BMI:  Body mass index is 39.32 kg/(m^2).  Estimated Nutritional Needs:   Kcal:  [id number]  Protein:  110-120 gm  Fluid:  1.8 L  EDUCATION NEEDS:   No education needs identified at this time  Molli Barrows, [place], [place], [place] Pager [phone] After Hours Pager [phone]

## 2015-11-24 NOTE — Procedures (Signed)
Intubation Procedure Note [name] [id number] 05-Mar-1961  Procedure: Intubation Indications: Airway protection and maintenance  Procedure Details Consent: Risks of procedure as well as the alternatives and risks of each were explained to the (patient/caregiver).  Consent for procedure obtained. Time Out: Verified patient identification, verified procedure, site/side was marked, verified correct patient position, special equipment/implants available, medications/allergies/relevent history reviewed, required imaging and test results available.  Performed  MAC and 3 Medications:  Fentanyl 100  mcg Etomidate 20 mg Versed 2 mg NMB rocuronium 50 mg   Evaluation Hemodynamic Status: Transient hypotension treated with fluid; O2 sats: stable throughout Patient's Current Condition: stable Complications: No apparent complications Patient did tolerate procedure well. Chest X-ray ordered to verify placement.  CXR: pending.   Richardson Landry Minor ACNP Maryanna Shape PCCM Pager [phone] till 3 pm If no answer page [phone] 11/24/2015, 10:32 AM  Baltazar Apo, MD, PhD 11/24/2015, 11:25 AM [facility] [phone] or if no answer [phone]

## 2015-11-24 NOTE — Progress Notes (Signed)
Pt aunt Clair Gulling) notified of patient's status and location. Phone message left for sister Dorann Lodge).

## 2015-11-24 NOTE — Progress Notes (Signed)
ANTICOAGULATION CONSULT NOTE - Initial Consult  Pharmacy Consult for Heparin Indication: atrial fibrillation  Allergies  Allergen Reactions  . Amiodarone Nausea And Vomiting    Patient Measurements: Height: 5\' 4"  (162.6 cm) Weight: 229 lb 3.2 oz (103.964 kg) IBW/kg (Calculated) : 54.7 Heparin Dosing Weight: 79 kg  Vital Signs: Temp: 98 F (36.7 C) (01/10 0733) Temp Source: Oral (01/10 0733) BP: 137/76 mmHg (01/10 0846) Pulse Rate: 124 (01/10 0846)  Labs:  Recent Labs  11/23/15 2234 11/24/15 0530  HGB 10.8* 9.1*  HCT 34.1* 29.1*  PLT 395 290  CREATININE 1.95* 1.50*    Estimated Creatinine Clearance: 50.9 mL/min (by C-G formula based on Cr of 1.5).   Medical History: Past Medical History  Diagnosis Date  . Alcohol abuse   . Narcotic abuse   . Cocaine abuse   . Marijuana abuse   . Tobacco abuse   . Alcoholic cirrhosis ([place])   . Cardiomyopathy ([name])   . Obesity   . Chronic combined systolic and diastolic CHF (congestive heart failure) ([place])     a. Last echo 12/205: EF 40-45%, inferoapical/posterior HK, not technically sufficient to allow eval of LV diastolic function, normal LA in size..  . Hypothyroidism   . GERD (gastroesophageal reflux disease)   . PAF (paroxysmal atrial fibrillation) ([place])   . Critical lower limb ischemia   . Hypertension   . CAD (coronary artery disease)     a. cath 11/10/2014 LM nl, LAD min irregs, D1 30 ost, D2 50d, LCX 67m, OM1 80 p/m (1.5 mm vessel), OM2 51m, RCA nondom 68m-->med rx.. Demand ischemia in the setting of rapid a-fib.  [name] Anxiety   . Poorly controlled type 2 diabetes mellitus ([place])   . Hyperlipemia   . Peripheral arterial disease ([place])     a. 01/2015 Angio/PTA: LSFA 100 w/ recon @ adductor canal and 1 vessel runoff via AT, RSFA 99 (atherectomy/pta) - 1 vessel runoff via diff dzs peroneal.  . Carotid artery disease ([place])     a. 01/2015 Carotid Angio: RICA [id number], LICA [id number]. s/p L carotid endarterectomy 02/2015.  [name] COPD  (chronic obstructive pulmonary disease) ([place])   . Diabetic peripheral neuropathy ([place])   . Paroxysmal atrial tachycardia ([place])   . Noncompliance   . Anemia   . B12 deficiency   . Alcoholic cirrhosis ([place])   . NSVT (nonsustained ventricular tachycardia) ([place])   . Paroxysmal atrial tachycardia ([place])   . Hypokalemia   . Hypomagnesemia   . Renal insufficiency     a. 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  [name] Elevated troponin     a. Chronic elevation.    Medications:  Infusions:  . amiodarone 60 mg/hr (11/24/15 0921)   Followed by  . amiodarone    . heparin    . nitroGLYCERIN 20 mcg/min (11/24/15 0845)    Assessment: 55 yo female admitted with SOB, on chronic Xarelto for afib.  Now in ICU on vent, pharmacy asked to transition to IV heparin.  Per patient last Xarelto dose was yesterday 1/9, unknown time, but she usually takes it at bedtime.  Baseline labs pending, heparin level should be artificially high given recent Xarelto doses.    Goal of Therapy:  Heparin level 0.3-0.7 units/ml Monitor platelets by anticoagulation protocol: Yes   Plan:  1. Check baseline heparin level and PTT now. 2. Start heparin at around 6 pm tonight, at 1100 units/hr, no bolus. 3. Heparin level 6 hrs after gtt starts. 4. Daily CBC, heparin level and PTT (until  heparin level unaffected by recent Xarelto) 5. F/u plans for intervention, resuming oral anticoagulant.  Uvaldo Rising, BCPS  Clinical Pharmacist Pager [phone]  11/24/2015 9:55 AM

## 2015-11-24 NOTE — H&P (Addendum)
RFA: decompensated heart failure  HPI: 55yoF with hx of HTN, DM2, HLD, active smoking, PAD, Carotid disease, nonobstructive coronary disease, non-ischemic cardiomyopathy with chronic elevated troponins, EF 40%, COPD, presents with 2 days of worsening sob in setting of recent increased sputum production and increased cough.  Also reports recent eating salty food at home.   States her weight is 4 pounds up today as well.  Feels short of breath at rest and with increased DOE  ROS  Also positive for nausea/vomiting, back pain, neuropathic pain.  She denies chest pain and chest pressure to me.  PMHx: Past Medical History  Diagnosis Date  . Alcohol abuse   . Narcotic abuse   . Cocaine abuse   . Marijuana abuse   . Tobacco abuse   . Alcoholic cirrhosis ([place])   . Cardiomyopathy ([place])   . Obesity   . Chronic combined systolic and diastolic CHF (congestive heart failure) ([place])     a. Last echo 12/205: EF 40-45%, inferoapical/posterior HK, not technically sufficient to allow eval of LV diastolic function, normal LA in size..  . Hypothyroidism   . GERD (gastroesophageal reflux disease)   . PAF (paroxysmal atrial fibrillation) ([place])   . Critical lower limb ischemia   . Hypertension   . CAD (coronary artery disease)     a. cath 11/10/2014 LM nl, LAD min irregs, D1 30 ost, D2 50d, LCX 81m OM1 80 p/m (1.5 mm vessel), OM2 440mRCA nondom 9087mmed rx.. Demand ischemia in the setting of rapid a-fib.  . AMarland Kitchenxiety   . Poorly controlled type 2 diabetes mellitus (HCC[place] . Hyperlipemia   . Peripheral arterial disease (HCCRanchester   a. 01/2015 Angio/PTA: LSFA 100 w/ recon @ adductor canal and 1 vessel runoff via AT, RSFA 99 (atherectomy/pta) - 1 vessel runoff via diff dzs peroneal.  . Carotid artery disease (HCC[place]org[place]   a. 01/2015 Carotid Angio: RICA [id number]/p L carotid endarterectomy 02/2015.  . CMarland KitchenPD (chronic obstructive pulmonary disease) (HCCKanorado . Diabetic peripheral neuropathy (HCCB[place] . Paroxysmal  atrial tachycardia (HCCBoard[place] . Noncompliance   . Anemia   . B12 deficiency   . Alcoholic cirrhosis (HCCGu [place] . NSVT (nonsustained ventricular tachycardia) (HCCSen[place] . Paroxysmal atrial tachycardia (HCCUnion[place] . Hypokalemia   . Hypomagnesemia   . Renal insufficiency     a. 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  . EMarland Kitchenevated troponin     a. Chronic elevation.   Medications No current facility-administered medications on file prior to encounter.   Current Outpatient Prescriptions on File Prior to Encounter  Medication Sig Dispense Refill  . acetaminophen (TYLENOL) 500 MG tablet Take 1,000 mg by mouth every 6 (six) hours as needed (pain).    . aMarland Kitchenetaminophen-codeine (TYLENOL #3) 300-30 MG tablet Take 1 tablet by mouth every 4 (four) hours as needed for moderate pain.    . aMarland Kitchenbuterol (PROVENTIL HFA;VENTOLIN HFA) 108 (90 BASE) MCG/ACT inhaler Inhale 2 puffs into the lungs every 6 (six) hours as needed for wheezing or shortness of breath. 1 each 3  . albuterol (PROVENTIL) (2.5 MG/3ML) 0.083% nebulizer solution Take 3 mLs (2.5 mg total) by nebulization every 6 (six) hours as needed. For wheezing. 75 mL 2  . atorvastatin (LIPITOR) 40 MG tablet Take 1 tablet (40 mg total) by mouth daily. 30 tablet 2  . Blood Glucose Monitoring Suppl (ACCU-CHEK AVIVA PLUS) W/DEVICE KIT 1 Device by Does not apply route 4 (four) times  daily -  before meals and at bedtime. 1 kit 0  . carvedilol (COREG) 12.5 MG tablet Take 1 tablet (12.5 mg total) by mouth 2 (two) times daily with a meal. (Patient taking differently: Take 6.25 mg by mouth 2 (two) times daily with a meal. ) 180 tablet 3  . Cholecalciferol (VITAMIN D) 2000 UNITS tablet Take 2,000 Units by mouth daily.    . clopidogrel (PLAVIX) 75 MG tablet Take 1 tablet (75 mg total) by mouth daily with breakfast. 30 tablet 6  . diltiazem (CARDIZEM CD) 180 MG 24 hr capsule Take 1 capsule (180 mg total) by mouth daily. 30 capsule 6  . folic acid (FOLVITE) 1 MG tablet Take 1 tablet (1 mg total) by  mouth daily. 30 tablet 5  . furosemide (LASIX) 40 MG tablet Take 1.5 tablets (60 mg total) by mouth 2 (two) times daily. 90 tablet 11  . Gabapentin, Once-Daily, (GRALISE) 300 MG TABS Take 300 mg by mouth 4 (four) times daily.     [name] glipiZIDE (GLUCOTROL XL) 10 MG 24 hr tablet Take 2 tablets (20 mg total) by mouth daily with breakfast. 60 tablet 3  . glucose blood (ACCU-CHEK AVIVA PLUS) test strip Use as instructed 100 each 12  . insulin aspart (NOVOLOG) 100 UNIT/ML FlexPen Sliding scale (Patient taking differently: Inject 6-14 Units into the skin 3 (three) times daily with meals. Per sliding scale) 15 mL 11  . insulin glargine (LANTUS) 100 unit/mL SOPN Inject 60 Units into the skin at bedtime.    . Insulin Pen Needle (ULTICARE MICRO PEN NEEDLES) 32G X 4 MM MISC 1 Syringe by Does not apply route 4 (four) times daily - after meals and at bedtime. 1 each 11  . Lancets (ACCU-CHEK SOFT TOUCH) lancets Use as instructed 100 each 12  . levothyroxine (SYNTHROID, LEVOTHROID) 50 MCG tablet Take 1 tablet (50 mcg total) by mouth daily. 30 tablet 2  . lisinopril (PRINIVIL,ZESTRIL) 20 MG tablet Take 20 mg by mouth daily.    . metFORMIN (GLUCOPHAGE) 500 MG tablet Take 500 mg by mouth 3 (three) times daily with meals.     . nitroGLYCERIN (NITROSTAT) 0.4 MG SL tablet Place 1 tablet (0.4 mg total) under the tongue every 5 (five) minutes as needed for chest pain. 25 tablet 3  . pantoprazole (PROTONIX) 40 MG tablet Take 1 tablet (40 mg total) by mouth daily. 30 tablet 2  . rivaroxaban (XARELTO) 20 MG TABS tablet Take 1 tablet (20 mg total) by mouth daily with supper. (Patient taking differently: Take 20 mg by mouth at bedtime. ) 30 tablet 2  . vitamin B-12 1000 MCG tablet Take 1 tablet (1,000 mcg total) by mouth daily. 30 tablet 5   Fam Hx: Family History  Problem Relation Age of Onset  . Hypertension Mother   . Diabetes Mother   . Cancer Mother     breast, ovarian, colon  . Clotting disorder Mother   . Heart  disease Mother   . Heart attack Mother   . Hypertension Father   . Heart disease Father   . Emphysema Sister     smoked   Soc Hx: Social History   Social History  . Marital Status: Divorced    Spouse Name: N/A  . Number of Children: N/A  . Years of Education: N/A   Occupational History  . disabled    Social History Main Topics  . Smoking status: Current Some Day Smoker -- 0.00 packs/day for 40 years  Types: E-cigarettes  . Smokeless tobacco: Never Used  . Alcohol Use: No  . Drug Use: No     Comment: 04/29/2015 "last drug use was ~ 09/08/2013"  . Sexual Activity: No   Other Topics Concern  . Not on file   Social History Narrative   Lives in [place], in [place] with sister.  They are looking to move but don't have a place to go yet.     Allergies: Allergies  Allergen Reactions  . Amiodarone Nausea And Vomiting   Physical Exam BP 118/83 mmHg  Pulse 110  Temp(Src) 98.4 F (36.9 C) (Oral)  Resp 21  Ht 5' 4" (1.626 m)  Wt 115.214 kg (254 lb)  BMI 43.58 kg/m2  SpO2 99%  LMP 11/27/2012 Obese 54yo, mildly tachypneic on supplemental oxygen JVP is very elevated Soft rales at bases, prolonged expiratory wheezes throughout Soft obese abdomen Bilateral pitting edema AAO without gross focal deficit  Labs 11.0 >-----------< 395                34  135  88   32 ------------------< 424 4.1   31   1.95  BNP 523.5  Troponin 0.17 (known to be chronically elevated)  EKG Sinus tach, nonspecific T wave abnormalities  Echo 10/30/14 EF 40% no significant valvular abnormalities  Impression: 54yoF with hx of HTN, DM2, HLD, active smoking, PAD, Carotid disease, nonobstructive coronary disease, non-ischemic cardiomyopathy with chronic elevated troponins, EF 40%, COPD, presents with SOB likely due to combined decompensated systolic heart failure and COPD exacerbation (increased SOB, increased sputum volume, increased cough).   Plan: # Decompensated HF - Strict Is/Os; weigh  daily - place foley - Lasix 60 IV BID: goal negative 1-2 liters - c/w coreg and ace-inhibitor - c/w ASA/Plavix - d/c dilt given HFrEF   - Insert foley (patient with mobility issues) - c/w statin - consider cardiac MRI to evaluate for inflammation of heart given LHC not showing large vessel disease - will check CBC with differential to look for eosinophilia  # Atrial arrhythmias - atach and PAF - d/c dilt given history of HFrEF - restart xarelto - telemetry  #Diabetes - hyperglycemia - restart lantus 60 qhs - novolog 10 now and qac - start sliding scale  - sugars will increase while on steroids, will need to remember to decrease insulin dosing when she comes off prednisone  # COPD exacerbation - CXR primarily with edema, but change in sputum pattern - albuterol qid standing and qid prn - prednisone 60 mg x 5 days - levoquin x 5 days (suspect viral infection but given criteria for complicated COPD exacerbation will treat). - obtain sputum sample - add tiotropium given COPD  Terressa Koyanagi, MD [employer]

## 2015-11-24 NOTE — Consult Note (Signed)
PULMONARY / CRITICAL CARE MEDICINE   Name: [name] MRN: [id number] DOB: 10/27/1961    ADMISSION DATE:  11/23/2015 CONSULTATION DATE:  1/10  REFERRING MD:  Cards  CHIEF COMPLAINT:  I can't breathe  HISTORY OF PRESENT ILLNESS:   55 yo smoker with a plethora of health issues who presented 1/9 with SOB. Subsequently developed A fib RVR 160/s and acutely decompensated and required urgent intubation for increased wob RR 38. Cardiology has treated her with Amio and are planning on cardioversion soon.  We will place a central to monitor CVP's to help with fluid management.  PAST MEDICAL HISTORY :  She  has a past medical history of Alcohol abuse; Narcotic abuse; Cocaine abuse; Marijuana abuse; Tobacco abuse; Alcoholic cirrhosis ([place]); Cardiomyopathy ([place]); Obesity; Chronic combined systolic and diastolic CHF (congestive heart failure) ([place]); Hypothyroidism; GERD (gastroesophageal reflux disease); PAF (paroxysmal atrial fibrillation) ([place]); Critical lower limb ischemia; Hypertension; CAD (coronary artery disease); Anxiety; Poorly controlled type 2 diabetes mellitus ([place]); Hyperlipemia; Peripheral arterial disease ([place]); Carotid artery disease ([place]); COPD (chronic obstructive pulmonary disease) ([place]); Diabetic peripheral neuropathy ([place]); Paroxysmal atrial tachycardia ([place]); Noncompliance; Anemia; B12 deficiency; Alcoholic cirrhosis ([place]); NSVT (nonsustained ventricular tachycardia) ([place]); Paroxysmal atrial tachycardia ([place]); Hypokalemia; Hypomagnesemia; Renal insufficiency; and Elevated troponin.  PAST SURGICAL HISTORY: She  has past surgical history that includes Cardioversion (~ 02/2013); lower extremity angiogram (N/A, 09/10/2013); left heart catheterization with coronary angiogram (N/A, 10/31/2014); Peripheral athrectomy (Right, 01/15/2015); Balloon angioplasty, artery (Right, 01/15/2015); Cardiac catheterization; lower extremity angiogram (N/A, 01/15/2015); carotid angiogram (N/A, 01/15/2015);  Endarterectomy (Left, 02/19/2015); and Dilation and curettage of uterus (1988).  Allergies  Allergen Reactions  . Amiodarone Nausea And Vomiting    No current facility-administered medications on file prior to encounter.   Current Outpatient Prescriptions on File Prior to Encounter  Medication Sig  . acetaminophen (TYLENOL) 500 MG tablet Take 1,000 mg by mouth every 6 (six) hours as needed (pain).  [name] acetaminophen-codeine (TYLENOL #3) 300-30 MG tablet Take 1 tablet by mouth every 4 (four) hours as needed for moderate pain.  [name] albuterol (PROVENTIL HFA;VENTOLIN HFA) 108 (90 BASE) MCG/ACT inhaler Inhale 2 puffs into the lungs every 6 (six) hours as needed for wheezing or shortness of breath.  [name] albuterol (PROVENTIL) (2.5 MG/3ML) 0.083% nebulizer solution Take 3 mLs (2.5 mg total) by nebulization every 6 (six) hours as needed. For wheezing.  [name] atorvastatin (LIPITOR) 40 MG tablet Take 1 tablet (40 mg total) by mouth daily.  . Blood Glucose Monitoring Suppl (ACCU-CHEK AVIVA PLUS) W/DEVICE KIT 1 Device by Does not apply route 4 (four) times daily -  before meals and at bedtime.  . carvedilol (COREG) 12.5 MG tablet Take 1 tablet (12.5 mg total) by mouth 2 (two) times daily with a meal. (Patient taking differently: Take 6.25 mg by mouth 2 (two) times daily with a meal. )  . Cholecalciferol (VITAMIN D) 2000 UNITS tablet Take 2,000 Units by mouth daily.  . clopidogrel (PLAVIX) 75 MG tablet Take 1 tablet (75 mg total) by mouth daily with breakfast.  . diltiazem (CARDIZEM CD) 180 MG 24 hr capsule Take 1 capsule (180 mg total) by mouth daily.  . folic acid (FOLVITE) 1 MG tablet Take 1 tablet (1 mg total) by mouth daily.  . furosemide (LASIX) 40 MG tablet Take 1.5 tablets (60 mg total) by mouth 2 (two) times daily.  . Gabapentin, Once-Daily, (GRALISE) 300 MG TABS Take 300 mg by mouth 4 (four) times daily.   [name] glipiZIDE (GLUCOTROL XL) 10  MG 24 hr tablet Take 2 tablets (20 mg total) by mouth daily with  breakfast.  . glucose blood (ACCU-CHEK AVIVA PLUS) test strip Use as instructed  . insulin aspart (NOVOLOG) 100 UNIT/ML FlexPen Sliding scale (Patient taking differently: Inject 6-14 Units into the skin 3 (three) times daily with meals. Per sliding scale)  . insulin glargine (LANTUS) 100 unit/mL SOPN Inject 60 Units into the skin at bedtime.  . Insulin Pen Needle (ULTICARE MICRO PEN NEEDLES) 32G X 4 MM MISC 1 Syringe by Does not apply route 4 (four) times daily - after meals and at bedtime.  . Lancets (ACCU-CHEK SOFT TOUCH) lancets Use as instructed  . levothyroxine (SYNTHROID, LEVOTHROID) 50 MCG tablet Take 1 tablet (50 mcg total) by mouth daily.  [name] lisinopril (PRINIVIL,ZESTRIL) 20 MG tablet Take 20 mg by mouth daily.  . metFORMIN (GLUCOPHAGE) 500 MG tablet Take 500 mg by mouth 3 (three) times daily with meals.   . nitroGLYCERIN (NITROSTAT) 0.4 MG SL tablet Place 1 tablet (0.4 mg total) under the tongue every 5 (five) minutes as needed for chest pain.  . pantoprazole (PROTONIX) 40 MG tablet Take 1 tablet (40 mg total) by mouth daily.  . rivaroxaban (XARELTO) 20 MG TABS tablet Take 1 tablet (20 mg total) by mouth daily with supper. (Patient taking differently: Take 20 mg by mouth at bedtime. )  . vitamin B-12 1000 MCG tablet Take 1 tablet (1,000 mcg total) by mouth daily.    FAMILY HISTORY:  Her indicated that her mother is deceased. She indicated that her father is deceased.   SOCIAL HISTORY: She  reports that she quit smoking about 3 weeks ago. Her smoking use included E-cigarettes. She smoked 0.00 packs per day for 40 years. She has quit using smokeless tobacco. She reports that she does not drink alcohol or use illicit drugs.  REVIEW OF SYSTEMS:   NA  SUBJECTIVE:  Acute distress  VITAL SIGNS: BP 137/76 mmHg  Pulse 124  Temp(Src) 98 F (36.7 C) (Oral)  Resp 22  Ht _0  (1.626 m)  Wt 229 lb 3.2 oz (103.964 kg)  BMI 39.32 kg/m2  SpO2 98%  LMP 11/27/2012  HEMODYNAMICS:     VENTILATOR SETTINGS: Vent Mode:  [-]  FiO2 (%):  [40 %] 40 % Set Rate:  [12 bmp] 12 bmp PEEP:  [6 cmH20] 6 cmH20  INTAKE / OUTPUT: I/O last 3 completed shifts: In: 17 [P.O.:840; IV Piggyback:100] Out: 850 [Urine:850]  PHYSICAL EXAMINATION: General:  MOWF in acute resp distress and required urgent intubation Neuro:  Prior to intubation awake and alert, able to converse and answer questions HEENT:  Edentulous, no jvd/lan  Cardiovascular: HSIR IR Afib with RVR Lungs:  Exp wheeze Abdomen:  Obese +bs Musculoskeletal:  Intact Skin:  Warm 2 ++ pitting edema  LABS:  BMET  Recent Labs Lab 11/18/15 1240 11/23/15 2234 11/24/15 0530  NA 139 135 137  K 4.0 4.1 3.9  CL 98* 88* 96*  CO2 _1 BUN 17 32* 33*  CREATININE 0.90 1.95* 1.50*  GLUCOSE 272* 424* 342*    Electrolytes  Recent Labs Lab 11/17/15 1224 11/18/15 1240 11/23/15 2234 11/24/15 0530  CALCIUM 8.8 9.2 8.8* 7.9*  MG 1.6  --   --   --     CBC  Recent Labs Lab 11/18/15 1240 11/23/15 2234 11/24/15 0530  WBC 11.4* 11.0* 10.2  HGB 9.8* 10.8* 9.1*  HCT 31.0* 34.1* 29.1*  PLT 379 395 290  Coag's No results for input(s): APTT, INR in the last 168 hours.  Sepsis Markers No results for input(s): LATICACIDVEN, PROCALCITON, O2SATVEN in the last 168 hours.  ABG  Recent Labs Lab 11/24/15 0251  PHART 7.400  PCO2ART 55.9*  PO2ART 92.0    Liver Enzymes  Recent Labs Lab 11/17/15 1224 11/24/15 0530  AST 38* 41  ALT 48* 54  ALKPHOS 208* 216*  BILITOT 0.3 0.6  ALBUMIN 3.3* 3.0*    Cardiac Enzymes  Recent Labs Lab 11/18/15 1945  TROPONINI 0.91*    Glucose  Recent Labs Lab 11/18/15 2053 11/23/15 1334 11/24/15 0351 11/24/15 0614  GLUCAP 272* 294* 330* 308*    Imaging Dg Chest 2 View  11/23/2015  CLINICAL DATA:  Shortness of breath. EXAM: CHEST  2 VIEW COMPARISON:  November 18, 2015. FINDINGS: The heart size and mediastinal contours are within normal limits. No pneumothorax  pleural effusion is noted. Mildly increased bibasilar interstitial opacities are noted concerning for edema or subsegmental atelectasis. The visualized skeletal structures are unremarkable. IMPRESSION: Mild bibasilar subsegmental atelectasis or possibly edema. Electronically Signed   By: Marijo Conception, M.D.   On: 11/23/2015 23:23     STUDIES:    CULTURES: 1/11 bc x 2>> 1/10 UC>> 1/10 Sputum>>  ANTIBIOTICS: 1/9 levaquin>>  SIGNIFICANT EVENTS: 1/10 Acute resp failure, tx to ICU and intubated urgently.  LINES/TUBES: 1/10 ET>> 1/10 Left I J CVL>>  DISCUSSION: 55 yo wf with acute resp distress with a fib rvr  ASSESSMENT / PLAN:  PULMONARY A: Acute resp failure, suspect due primarily to CHF in setting RVR, superimposed COPD CHF COPD with continued tobacco abuse P:   Intubate now. Vent bundle Scheduled Bd's Start corticosteroids 1/10  CARDIOVASCULAR A:  CHF A fib with RVR HTN Poor compliance  P:  Amio per cards Ntg drip > suspect will d/c once sedated Diuresis, monitor UOP and renal fxn, BP  RENAL Lab Results  Component Value Date   CREATININE 1.50* 11/24/2015   CREATININE 1.95* 11/23/2015   CREATININE 0.90 11/18/2015   CREATININE 0.99 11/17/2015   CREATININE 0.95 07/21/2015   CREATININE 0.85 01/08/2015    A:   Worsening renal failure P:   Avoid nephrotoxins Adjust diuresis based on renal fxn  GASTROINTESTINAL A:   GI protection P:   PPI  HEMATOLOGIC A:   Anticoagulation for A fib P:  Heparin drip per Cards; was on xarelto > holding  INFECTIOUS A:   Presumed brochitis P:   See ID  ENDOCRINE A:   Poorly controlled DM P:   SSI  NEUROLOGIC A:   Alert and Orientated  P:   RASS goal: 0 Will require sedation for ETT tolerance.  Diprivan given her hypertension    FAMILY  - Updates: None at bedside  - Inter-disciplinary family meet or Palliative Care meeting due by:  day [place] [facility] Pager [phone] till  3 pm If no answer page [phone] 11/24/2015, 9:26 AM  Attending Note:  I have examined patient, reviewed labs, studies and notes. I have discussed the case with S Minor, and I agree with the data and plans as I have amended in full above. Patient has chronic resp insufficiency due to chronic systolic CHF, COPD, obesity. She has evolved acute respiratory failure in setting A fib + RVR. Suspect multifactorial > CHF, A Fib, COPD. On my eval she is able to converse but is tachypneic and labored on BiPAP. Breath sounds are very distant with  some end exp wheezing. Diuresis and rate control have been addressed by cardiology. We will plan to intubate to support her while we address the contributing factors here. Continue levaquin for now, although history inconsistent with a PNA. Start steroids given wheeze on exam.  Independent critical care time is 45 minutes.   Baltazar Apo, MD, PhD 11/24/2015, 11:12 AM [facility] [phone] or if no answer [phone]

## 2015-11-24 NOTE — ED Notes (Signed)
Paged [place] cards about admission orders

## 2015-11-25 ENCOUNTER — Ambulatory Visit (HOSPITAL_COMMUNITY): Payer: Medicaid Other

## 2015-11-25 ENCOUNTER — Inpatient Hospital Stay (HOSPITAL_COMMUNITY): Payer: Medicare Other

## 2015-11-25 ENCOUNTER — Encounter (HOSPITAL_COMMUNITY): Admission: RE | Admit: 2015-11-25 | Payer: Medicare Other | Source: Ambulatory Visit

## 2015-11-25 DIAGNOSIS — I480 Paroxysmal atrial fibrillation: Secondary | ICD-10-CM

## 2015-11-25 DIAGNOSIS — I5043 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) and diastolic (congestive) heart failure: Secondary | ICD-10-CM

## 2015-11-25 DIAGNOSIS — I5042 Chronic combined systolic (congestive) and diastolic (congestive) heart failure: Secondary | ICD-10-CM | POA: Insufficient documentation

## 2015-11-25 DIAGNOSIS — I5033 Acute on chronic diastolic (congestive) heart failure: Secondary | ICD-10-CM | POA: Insufficient documentation

## 2015-11-25 DIAGNOSIS — R062 Wheezing: Secondary | ICD-10-CM | POA: Insufficient documentation

## 2015-11-25 LAB — GLUCOSE, CAPILLARY
GLUCOSE-CAPILLARY: 165 mg/dL — AB (ref 65–99)
GLUCOSE-CAPILLARY: 170 mg/dL — AB (ref 65–99)
Glucose-Capillary: 187 mg/dL — ABNORMAL HIGH (ref 65–99)
Glucose-Capillary: 192 mg/dL — ABNORMAL HIGH (ref 65–99)
Glucose-Capillary: 221 mg/dL — ABNORMAL HIGH (ref 65–99)
Glucose-Capillary: 224 mg/dL — ABNORMAL HIGH (ref 65–99)

## 2015-11-25 LAB — HEPARIN LEVEL (UNFRACTIONATED)
Heparin Unfractionated: 0.68 IU/mL (ref 0.30–0.70)
Heparin Unfractionated: 0.55 IU/mL (ref 0.30–0.70)

## 2015-11-25 LAB — BASIC METABOLIC PANEL
ANION GAP: 10 (ref 5–15)
ANION GAP: 12 (ref 5–15)
BUN: 53 mg/dL — AB (ref 6–20)
BUN: 68 mg/dL — AB (ref 6–20)
CALCIUM: 8.4 mg/dL — AB (ref 8.9–10.3)
CO2: 32 mmol/L (ref 22–32)
CO2: 32 mmol/L (ref 22–32)
CREATININE: 2 mg/dL — AB (ref 0.44–1.00)
Calcium: 8.3 mg/dL — ABNORMAL LOW (ref 8.9–10.3)
Chloride: 96 mmol/L — ABNORMAL LOW (ref 101–111)
Chloride: 96 mmol/L — ABNORMAL LOW (ref 101–111)
Creatinine, Ser: 1.93 mg/dL — ABNORMAL HIGH (ref 0.44–1.00)
GFR calc Af Amer: 33 mL/min — ABNORMAL LOW (ref >60)
GFR calc Af Amer: 32 mL/min — ABNORMAL LOW (ref >60)
GFR, EST NON AFRICAN AMERICAN: 29 mL/min — AB (ref >60)
GFR, EST NON AFRICAN AMERICAN: 27 mL/min — AB (ref >60)
GLUCOSE: 204 mg/dL — AB (ref 65–99)
Glucose, Bld: 185 mg/dL — ABNORMAL HIGH (ref 65–99)
POTASSIUM: 3.8 mmol/L (ref 3.5–5.1)
Potassium: 3.7 mmol/L (ref 3.5–5.1)
SODIUM: 138 mmol/L (ref 135–145)
Sodium: 140 mmol/L (ref 135–145)

## 2015-11-25 LAB — TROPONIN I: TROPONIN I: 0.22 ng/mL — AB (ref <0.031)

## 2015-11-25 LAB — APTT
APTT: 67 s — AB (ref 24–37)
APTT: 61 s — AB (ref 24–37)
APTT: 58 s — AB (ref 24–37)

## 2015-11-25 LAB — CBC
HCT: 25.8 % — ABNORMAL LOW (ref 36.0–46.0)
Hemoglobin: 8 g/dL — ABNORMAL LOW (ref 12.0–15.0)
MCH: 28.4 pg (ref 26.0–34.0)
MCHC: 31 g/dL (ref 30.0–36.0)
MCV: 91.5 fL (ref 78.0–100.0)
PLATELETS: 267 10*3/uL (ref 150–400)
RBC: 2.82 MIL/uL — AB (ref 3.87–5.11)
RDW: 14.5 % (ref 11.5–15.5)
WBC: 8.9 10*3/uL (ref 4.0–10.5)

## 2015-11-25 MED ORDER — ATROPINE SULFATE 1 % OP SOLN
2.0000 [drp] | Freq: Four times a day (QID) | OPHTHALMIC | Status: DC
  Administered 2015-11-25 – 2015-11-26 (×8): 2 [drp] via SUBLINGUAL
  Filled 2015-11-25: qty 2

## 2015-11-25 MED ORDER — DOCUSATE SODIUM 50 MG/5ML PO LIQD: 100.0000 mg | Freq: Two times a day (BID) | ORAL | Status: DC

## 2015-11-25 MED ORDER — FUROSEMIDE 10 MG/ML IJ SOLN
40.0000 mg | Freq: Once | INTRAMUSCULAR | Status: AC
  Administered 2015-11-25: 40 mg via INTRAVENOUS

## 2015-11-25 MED ORDER — SENNOSIDES 8.8 MG/5ML PO SYRP
5.0000 mL | ORAL_SOLUTION | Freq: Two times a day (BID) | ORAL | Status: DC
  Filled 2015-11-25: qty 5

## 2015-11-25 MED ORDER — PANTOPRAZOLE SODIUM 40 MG PO PACK
40.0000 mg | PACK | Freq: Every day | ORAL | Status: DC
  Administered 2015-11-25 – 2015-11-26 (×2): 40 mg
  Filled 2015-11-25 (×3): qty 20

## 2015-11-25 MED ORDER — ONDANSETRON HCL 4 MG/2ML IJ SOLN
4.0000 mg | Freq: Four times a day (QID) | INTRAMUSCULAR | Status: DC | PRN
  Administered 2015-11-27 – 2015-11-30 (×2): 4 mg via INTRAVENOUS
  Filled 2015-11-25 (×2): qty 2

## 2015-11-25 MED ORDER — LEVOFLOXACIN IN D5W 750 MG/150ML IV SOLN
750.0000 mg | INTRAVENOUS | Status: DC
  Administered 2015-11-26: 750 mg via INTRAVENOUS
  Filled 2015-11-25: qty 150

## 2015-11-25 MED ORDER — SENNOSIDES-DOCUSATE SODIUM 8.6-50 MG PO TABS
1.0000 | ORAL_TABLET | Freq: Two times a day (BID) | ORAL | Status: DC
  Administered 2015-11-26 – 2015-12-03 (×16): 1
  Filled 2015-11-25 (×17): qty 1

## 2015-11-25 MED ORDER — FUROSEMIDE 10 MG/ML IJ SOLN
10.0000 mg/h | INTRAVENOUS | Status: DC
  Administered 2015-11-25 – 2015-11-26 (×2): 10 mg/h via INTRAVENOUS
  Filled 2015-11-25 (×4): qty 25

## 2015-11-25 MED ORDER — HEPARIN (PORCINE) IN NACL 100-0.45 UNIT/ML-% IJ SOLN
1300.0000 [IU]/h | INTRAMUSCULAR | Status: AC
  Administered 2015-11-25: 1250 [IU]/h via INTRAVENOUS
  Administered 2015-11-26 – 2015-11-27 (×2): 1300 [IU]/h via INTRAVENOUS
  Filled 2015-11-25 (×7): qty 250

## 2015-11-25 NOTE — Progress Notes (Signed)
PULMONARY / CRITICAL CARE MEDICINE   Name: [name] MRN: [id number] DOB: [birth year]    ADMISSION DATE:  11/23/2015 CONSULTATION DATE:  1/10  REFERRING MD:  Cards   SUBJECTIVE: remains intubated, awake and nods head yes/no to questions, denies pain, feels breathing is improved on vent   VITAL SIGNS: BP 104/59 mmHg  Pulse 54  Temp(Src) 98.3 F (36.8 C) (Oral)  Resp 24  Ht 5\' 4"  (1.626 m)  Wt 241 lb 2.9 oz (109.4 kg)  BMI 41.38 kg/m2  SpO2 100%  LMP 11/27/2012  HEMODYNAMICS: CVP:  [10 mmHg-17 mmHg] 14 mmHg  VENTILATOR SETTINGS: Vent Mode:  [-] PRVC FiO2 (%):  [40 %-100 %] 60 % Set Rate:  [12 bmp-24 bmp] 24 bmp Vt Set:  [440 mL] 440 mL PEEP:  [5 cmH20-6 cmH20] 5 cmH20 Plateau Pressure:  [20 cmH20-30 cmH20] 30 cmH20  INTAKE / OUTPUT: I/O last 3 completed shifts: In: 2735.9 [P.O.:1200; I.V.:818; Other:250; NG/GT:367.9; IV Piggyback:100] Out: 1875 [Urine:1875]  PHYSICAL EXAMINATION: General:  Obese female on mechanical vent Neuro:  Awake, alert, follows commands, RASS 0 HEENT:  EOMI Cardiovascular: RRR, no m/r/g Lungs:  Exp wheezing Abdomen:  Obese, +bs Musculoskeletal:  Moves all extremities, trace edema lower extremities Skin: no rash/lesions  LABS:  BMET  Recent Labs Lab 11/23/15 2234 11/24/15 0530 11/25/15 0430  NA 135 137 138  K 4.1 3.9 3.8  CL 88* 96* 96*  CO2 31 31 32  BUN 32* 33* 53*  CREATININE 1.95* 1.50* 1.93*  GLUCOSE 424* 342* 185*    Electrolytes  Recent Labs Lab 11/23/15 2234 11/24/15 0530 11/25/15 0430  CALCIUM 8.8* 7.9* 8.3*    CBC  Recent Labs Lab 11/23/15 2234 11/24/15 0530 11/25/15 0430  WBC 11.0* 10.2 8.9  HGB 10.8* 9.1* 8.0*  HCT 34.1* 29.1* 25.8*  PLT 395 290 267    Coag's  Recent Labs Lab 11/24/15 1113 11/25/15 0430  APTT 32 67*    Sepsis Markers No results for input(s): LATICACIDVEN, PROCALCITON, O2SATVEN in the last 168 hours.  ABG  Recent Labs Lab 11/24/15 0251 11/24/15 1150  PHART  7.400 7.291*  PCO2ART 55.9* 79.6*  PO2ART 92.0 543.0*    Liver Enzymes  Recent Labs Lab 11/24/15 0530  AST 41  ALT 54  ALKPHOS 216*  BILITOT 0.6  ALBUMIN 3.0*    Cardiac Enzymes  Recent Labs Lab 11/24/15 1808 11/24/15 2300 11/25/15 0430  TROPONINI 0.28* 0.23* 0.22*    Glucose  Recent Labs Lab 11/24/15 0614 11/24/15 1130 11/24/15 1621 11/24/15 2034 11/24/15 2335 11/25/15 0345  GLUCAP 308* 191* 261* 256* 224* 165*    Imaging Dg Chest Port 1 View  11/25/2015  CLINICAL DATA:  Acute respiratory failure. EXAM: PORTABLE CHEST 1 VIEW COMPARISON:  11/24/2015. FINDINGS: Endotracheal tube, left IJ line, NG tube and good anatomic position. Right base subsegmental atelectasis and or mild infiltrate. No pleural effusion or pneumothorax. Heart size stable. No acute bony abnormality . IMPRESSION: 1. Lines and tubes in stable position. 2. Right base subsegmental atelectasis and or mild infiltrate. Electronically Signed   By: Marcello Moores  Register   On: 11/25/2015 07:16   Dg Chest Port 1 View  11/24/2015  CLINICAL DATA:  Status post intubation and central line placement EXAM: PORTABLE CHEST - 1 VIEW COMPARISON:  11/23/2015 FINDINGS: Endotracheal tube is now seen approximately 2.9 cm above the carina. A nasogastric catheter courses towards the stomach. Left central venous line is noted in the proximal superior vena cava. No pneumothorax is noted.  Cardiac shadow remains enlarged accentuated by the portable technique. The lungs are clear bilaterally. IMPRESSION: Tubes and lines as described above in satisfactory position. No acute abnormality noted. Electronically Signed   By: Inez Catalina M.D.   On: 11/24/2015 11:11   Dg Abd Portable 1v  11/24/2015  CLINICAL DATA:  Status post gastric catheter placement EXAM: PORTABLE ABDOMEN - 1 VIEW COMPARISON:  None. FINDINGS: Gastric catheter is noted within the stomach. Scattered large and small bowel gas is noted. No obstructive changes are seen. No free  air is noted. IMPRESSION: Gastric catheter within the stomach. Electronically Signed   By: Inez Catalina M.D.   On: 11/24/2015 11:13     STUDIES:    CULTURES: 1/11 bc x 2>> 1/10 UC>> 1/10 Sputum>>  ANTIBIOTICS: 1/9 levaquin>>  SIGNIFICANT EVENTS: 1/10 Acute resp failure, tx to ICU and intubated urgently.  LINES/TUBES: 1/10 ET>> 1/10 Left I J CVL>>  DISCUSSION: 55 yo wf with acute resp distress with a fib rvr, decompensated with increased WOB requiring intubation on 1/10.  ASSESSMENT / PLAN:  PULMONARY A: Acute resp failure, suspect due primarily to CHF in setting RVR, superimposed COPD CHF COPD with continued tobacco abuse P:   Intubated 1/10, wean as tolerated SBT Scheduled Bd's Start corticosteroids 1/10, given Solu-Medrol once, now on Prednisone   CARDIOVASCULAR A:  CHF A fib with RVR HTN Poor compliance  P:  Amio per cards Off Ntg drip Diuresis, monitor UOP and renal fxn, BP CVP monitoring Possible DCCV today  RENAL Lab Results  Component Value Date   CREATININE 1.93* 11/25/2015   CREATININE 1.50* 11/24/2015   CREATININE 1.95* 11/23/2015   CREATININE 0.99 11/17/2015   CREATININE 0.95 07/21/2015   CREATININE 0.85 01/08/2015    A:   AKI P:   Avoid nephrotoxins Adjust diuresis based on renal fxn  GASTROINTESTINAL A:   GI protection P:   PPI Senna Colace  HEMATOLOGIC A:   Anticoagulation for A fib P:  Heparin drip per Cards; was on xarelto at home > holding  INFECTIOUS A:   Presumed bronchitis P:   Levaquin   ENDOCRINE A:   Poorly controlled DM P:   SSI  NEUROLOGIC A:   Alert and Orientated  P:   RASS goal: -1 Fentanyl, Versed for sedation, wean as tolerated    Zada Finders Internal Medicine PGY1 Pager: [phone]

## 2015-11-25 NOTE — Progress Notes (Signed)
ANTICOAGULATION CONSULT NOTE - Follow Up Consult  Pharmacy Consult for Heparin Indication: atrial fibrillation  Allergies  Allergen Reactions  . Amiodarone Nausea And Vomiting    Patient Measurements: Height: 5\' 4"  (162.6 cm) Weight: 241 lb 2.9 oz (109.4 kg) IBW/kg (Calculated) : 54.7 Heparin Dosing Weight: 79kg  Vital Signs: Temp: 98.8 F (37.1 C) (01/11 1131) Temp Source: Oral (01/11 1131) BP: 108/64 mmHg (01/11 1100) Pulse Rate: 59 (01/11 1100)  Labs:  Recent Labs  11/23/15 2234 11/24/15 0530 11/24/15 1110 11/24/15 1113 11/24/15 1808 11/24/15 2300 11/25/15 0430 11/25/15 1300  HGB 10.8* 9.1*  --   --   --   --  8.0*  --   HCT 34.1* 29.1*  --   --   --   --  25.8*  --   PLT 395 290  --   --   --   --  267  --   APTT  --   --   --  32  --   --  67* 61*  HEPARINUNFRC  --   --  1.07*  --   --   --  0.68  --   CREATININE 1.95* 1.50*  --   --   --   --  1.93*  --   TROPONINI  --   --   --   --  0.28* 0.23* 0.22*  --     Estimated Creatinine Clearance: 40.8 mL/min (by C-G formula based on Cr of 1.93).   Medications:  Heparin @ 1100 units/hr  Assessment: 54yof on xarelto for afib, decompensated yesterday and was intubated. Xarelto held and switched to IV heparin (last xarelto  dose 1/9 PM). Heparin level and aPTT were therapeutic this morning, but confirmatory aPTT is below goal. Hemoglobin drifting down, platelets stable. No bleeding reported.  Goal of Therapy:  APTT 66-102 seconds Heparin level 0.3-0.7 units/ml Monitor platelets by anticoagulation protocol: Yes   Plan:  1) Increase heparin to 1250 units/hr 2) Check heparin level and aPTT in 6 hours  Deboraha Sprang 11/25/2015,1:59 PM

## 2015-11-25 NOTE — Progress Notes (Signed)
Patient ID: [name], female   DOB: July 25, 1961, 55 y.o.   MRN: [id number]    Advanced Heart Failure Rounding Note   Subjective:    She was intubated 1/10 for hypoxemic respiratory failure and increased workup of breathing.  She converted back to NSR overnight on amiodarone gtt and remains on amiodarone gtt.  She required norepinephrine overnight but is now off. She did not have much recorded response to IV Lasix yesterday and CVP is 19-20 this morning.   Echo (11/24/15) showed EF 45% with inferolateral hypokinesis and mildly decreased RV systolic function.  This is similar to prior echo from 12/15.    Most recent Gabbs 10/31/2014: Large caliber vessel with 30% mid stenosis. The first obtuse marginal branch was a small caliber vessel (1.5 mm) with diffuse 80% stenosis in the proximal and mid segment. The second obtuse marginal branch is small in caliber with 40% mid stenosis. The left posterolateral branch has mild diffuse plaque.    Objective:   Weight Range:  Vital Signs:   Temp:  [98.3 F (36.8 C)-100 F (37.8 C)] 98.4 F (36.9 C) (01/11 0832) Pulse Rate:  [52-110] 80 (01/11 0800) Resp:  [21-26] 22 (01/11 0800) BP: (57-152)/(34-83) 121/74 mmHg (01/11 0800) SpO2:  [94 %-100 %] 100 % (01/11 0800) FiO2 (%):  [50 %-100 %] 50 % (01/11 0745) Weight:  [241 lb 2.9 oz (109.4 kg)] 241 lb 2.9 oz (109.4 kg) (01/11 0427) Last BM Date: 11/23/15  Weight change: Filed Weights   11/23/15 2225 11/24/15 0322 11/25/15 0427  Weight: 254 lb (115.214 kg) 229 lb 3.2 oz (103.964 kg) 241 lb 2.9 oz (109.4 kg)    Intake/Output:   Intake/Output Summary (Last 24 hours) at 11/25/15 0911 Last data filed at 11/25/15 0600  Gross per 24 hour  Intake 1346.49 ml  Output   1025 ml  Net 321.49 ml     Physical Exam: General:  Intubated, awake.  HEENT: normal Neck: Thick. JVP hard to assess but appears elevated.  Cor: PMI nondisplaced. Regular S1S2. No rubs, gallops or murmurs. Lungs: Soft lung  sounds, mechanically ventilated.   Abdomen: soft, nontender, nondistended. No hepatosplenomegaly. No bruits or masses. Good bowel sounds. Extremities: no cyanosis, clubbing, rash. 1+ edema 1/2 to knees bilaterally.  Neuro: Alert on vent, follows commands.   Telemetry:  NSR 50s  Labs: Basic Metabolic Panel:  Recent Labs Lab 11/18/15 1240 11/23/15 2234 11/24/15 0530 11/25/15 0430  NA 139 135 137 138  K 4.0 4.1 3.9 3.8  CL 98* 88* 96* 96*  CO2 28 31 31  32  GLUCOSE 272* 424* 342* 185*  BUN 17 32* 33* 53*  CREATININE 0.90 1.95* 1.50* 1.93*  CALCIUM 9.2 8.8* 7.9* 8.3*    Liver Function Tests:  Recent Labs Lab 11/24/15 0530  AST 41  ALT 54  ALKPHOS 216*  BILITOT 0.6  PROT 6.8  ALBUMIN 3.0*   No results for input(s): LIPASE, AMYLASE in the last 168 hours. No results for input(s): AMMONIA in the last 168 hours.  CBC:  Recent Labs Lab 11/18/15 1240 11/23/15 2234 11/24/15 0530 11/25/15 0430  WBC 11.4* 11.0* 10.2 8.9  NEUTROABS  --   --  8.4*  --   HGB 9.8* 10.8* 9.1* 8.0*  HCT 31.0* 34.1* 29.1* 25.8*  MCV 90.6 92.7 92.1 91.5  PLT 379 395 290 267    Cardiac Enzymes:  Recent Labs Lab 11/18/15 1945 11/24/15 1808 11/24/15 2300 11/25/15 0430  TROPONINI 0.91* 0.28* 0.23* 0.22*  BNP: BNP (last 3 results)  Recent Labs  06/23/15 0005 10/15/15 1355 11/23/15 2234  BNP 539.4* 246.5* 523.5*    ProBNP (last 3 results) No results for input(s): PROBNP in the last 8760 hours.    Other results:  Imaging: Dg Chest 2 View  11/23/2015  CLINICAL DATA:  Shortness of breath. EXAM: CHEST  2 VIEW COMPARISON:  November 18, 2015. FINDINGS: The heart size and mediastinal contours are within normal limits. No pneumothorax pleural effusion is noted. Mildly increased bibasilar interstitial opacities are noted concerning for edema or subsegmental atelectasis. The visualized skeletal structures are unremarkable. IMPRESSION: Mild bibasilar subsegmental atelectasis or possibly  edema. Electronically Signed   By: Marijo Conception, M.D.   On: 11/23/2015 23:23   Dg Chest Port 1 View  11/25/2015  CLINICAL DATA:  Acute respiratory failure. EXAM: PORTABLE CHEST 1 VIEW COMPARISON:  11/24/2015. FINDINGS: Endotracheal tube, left IJ line, NG tube and good anatomic position. Right base subsegmental atelectasis and or mild infiltrate. No pleural effusion or pneumothorax. Heart size stable. No acute bony abnormality . IMPRESSION: 1. Lines and tubes in stable position. 2. Right base subsegmental atelectasis and or mild infiltrate. Electronically Signed   By: Marcello Moores  Register   On: 11/25/2015 07:16   Dg Chest Port 1 View  11/24/2015  CLINICAL DATA:  Status post intubation and central line placement EXAM: PORTABLE CHEST - 1 VIEW COMPARISON:  11/23/2015 FINDINGS: Endotracheal tube is now seen approximately 2.9 cm above the carina. A nasogastric catheter courses towards the stomach. Left central venous line is noted in the proximal superior vena cava. No pneumothorax is noted. Cardiac shadow remains enlarged accentuated by the portable technique. The lungs are clear bilaterally. IMPRESSION: Tubes and lines as described above in satisfactory position. No acute abnormality noted. Electronically Signed   By: Inez Catalina M.D.   On: 11/24/2015 11:11   Dg Abd Portable 1v  11/24/2015  CLINICAL DATA:  Status post gastric catheter placement EXAM: PORTABLE ABDOMEN - 1 VIEW COMPARISON:  None. FINDINGS: Gastric catheter is noted within the stomach. Scattered large and small bowel gas is noted. No obstructive changes are seen. No free air is noted. IMPRESSION: Gastric catheter within the stomach. Electronically Signed   By: Inez Catalina M.D.   On: 11/24/2015 11:13     Medications:     Scheduled Medications: . antiseptic oral rinse  7 mL Mouth Rinse QID  . atorvastatin  40 mg Oral Daily  . chlorhexidine gluconate  15 mL Mouth Rinse BID  . cholecalciferol  2,000 Units Oral Daily  . docusate sodium   100 mg Oral BID  . feeding supplement (VITAL HIGH PROTEIN)  1,000 mL Per Tube Q24H  . folic acid  1 mg Oral Daily  . furosemide  40 mg Intravenous Once  . gabapentin  300 mg Oral TID AC & HS  . insulin aspart  0-20 Units Subcutaneous 6 times per day  . insulin aspart  10 Units Subcutaneous 6 times per day  . insulin glargine  60 Units Subcutaneous QHS  . levofloxacin (LEVAQUIN) IV  500 mg Intravenous Daily  . levothyroxine  50 mcg Oral QAC breakfast  . pantoprazole  40 mg Oral Daily  . predniSONE  40 mg Per Tube Q breakfast  . senna  1 tablet Oral BID  . tiotropium  18 mcg Inhalation Daily    Infusions: . amiodarone 30 mg/hr (11/25/15 0554)  . fentaNYL infusion INTRAVENOUS 100 mcg/hr (11/24/15 1449)  . furosemide (LASIX) infusion    .  heparin 1,100 Units/hr (11/24/15 1745)  . norepinephrine (LEVOPHED) Adult infusion Stopped (11/24/15 2052)    PRN Medications: acetaminophen, acetaminophen-codeine, albuterol, ALPRAZolam, fentaNYL (SUBLIMAZE) injection, fentaNYL (SUBLIMAZE) injection, midazolam, midazolam, ondansetron (ZOFRAN) IV   Assessment/Plan :   1. Acute hypoxemic respiratory failure: Suspect combination of CHF and COPD exacerbation, wheezing initially and moving air poorly => intubated 1/10.  - Treating with IV Lasix, nebs, steroids, and antibiotics.  2. Atrial fibrillation with RVR: History of PAF, on Xarelto at home.  Says she has not missed any doses.  HR in 150s initially, started amiodarone gtt with eventual conversion to NSR, remains in NSR today.  - Continue amiodarone gtt today.  - Continue anticoagulation, will use IV heparin gtt for now as she may need procedure.   3. Acute on chronic systolic CHF: [id number] on echo with inferolateral hypokinesis and mildly decreased RV systolic function.  She had a similar echo pattern in 12/15 and cardiac cath in 12/15 with nonobstructive and relatively mild CAD.  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   She briefly required norepinephrine, may have been due to sedation (dropped BP after intubation), now stable BP off pressors.  She remains volume overloaded, CVP 19-20.  She did not diurese well with Lasix boluses yesterday.  Creatinine to 1.9.   - Off Coreg with wheezing and decompensated CHF.  - Hold lisinopril with AKI.  - Will give 40 mg IV lasix x 1 followed by Lasix infusion at 10 mg/hr.  Follow creatinine closely, may improve with lowering of renal venous pressure (CVP quite high).  4. COPD: Suspect exacerbation.  Wheezing on exam prior to intubation.  Nebs, steroids, abx per CCM.   5. PAD: Extensive PAD history.  She has been on ASA, Plavix and Xarelto at home. Last peripheral PCI was in 2014.  I will stop ASA and Plavix as there is likely no benefit beyond her anticoagulation alone and will make her more prone to pathologic bleeding.  6. AKI: Creatinine 1.9, follow closely with diuresis.  7. Anemia: Chronic, lower today.  As above, stopping Plavix and ASA, will continue anticoagulation with heparin for now, eventually transition back to Xarelto.  8. Cirrhosis: ETOH-related per report.  9. CAD: Nonobstructive on 12/15 cath.  TnI mildly elevated this admission, max 0.28 with minimal trend.  Suspect most likely demand ischemia with hypoxemia and volume overload.  Echo wall motion pattern this admission is similar to the prior echo in 12/15.  Would avoid cath for now, especially with AKI.   Loralie Champagne 11/25/2015 9:11 AM

## 2015-11-25 NOTE — Progress Notes (Signed)
[facility] for Heparin Indication: atrial fibrillation  Allergies  Allergen Reactions  . Amiodarone Nausea And Vomiting    Patient Measurements: Height: 5\' 4"  (162.6 cm) Weight: 241 lb 2.9 oz (109.4 kg) IBW/kg (Calculated) : 54.7 Heparin Dosing Weight: 79 kg  Vital Signs: Temp: 98.3 F (36.8 C) (01/11 0346) Temp Source: Oral (01/11 0346) BP: 103/65 mmHg (01/10 2100) Pulse Rate: 69 (01/10 2100)  Labs:  Recent Labs  11/23/15 2234 11/24/15 0530 11/24/15 1110 11/24/15 1113 11/24/15 1808 11/24/15 2300 11/25/15 0430  HGB 10.8* 9.1*  --   --   --   --  8.0*  HCT 34.1* 29.1*  --   --   --   --  25.8*  PLT 395 290  --   --   --   --  267  APTT  --   --   --  32  --   --  67*  HEPARINUNFRC  --   --  1.07*  --   --   --  0.68  CREATININE 1.95* 1.50*  --   --   --   --   --   TROPONINI  --   --   --   --  0.28* 0.23*  --     Estimated Creatinine Clearance: 52.4 mL/min (by C-G formula based on Cr of 1.5).   Assessment: 55 yo female with SOB/VDRF, h/o Afib and Xarelto on hold, for heparin,   Goal of Therapy:  aPTT 66-102 Heparin level 0.3-0.7 units/ml Monitor platelets by anticoagulation protocol: Yes   Plan:  Continue Heparin at current rate for now Recheck PTT in 6 hours to verify  Phillis Knack, PharmD, BCPS

## 2015-11-25 NOTE — Progress Notes (Signed)
ANTICOAGULATION CONSULT NOTE - Follow Up Consult  Pharmacy Consult for Heparin Indication: atrial fibrillation  Allergies  Allergen Reactions  . Amiodarone Nausea And Vomiting    Patient Measurements: Height: 5\' 4"  (162.6 cm) Weight: 241 lb 2.9 oz (109.4 kg) IBW/kg (Calculated) : 54.7 Heparin Dosing Weight: 79kg  Vital Signs: Temp: 98.6 F (37 C) (01/11 2017) Temp Source: Oral (01/11 2017) BP: 116/67 mmHg (01/11 2000) Pulse Rate: 69 (01/11 2000)  Labs:  Recent Labs  11/23/15 2234 11/24/15 0530 11/24/15 1110  11/24/15 1808 11/24/15 2300 11/25/15 0430 11/25/15 1300 11/25/15 1752 11/25/15 1940  HGB 10.8* 9.1*  --   --   --   --  8.0*  --   --   --   HCT 34.1* 29.1*  --   --   --   --  25.8*  --   --   --   PLT 395 290  --   --   --   --  267  --   --   --   APTT  --   --   --   < >  --   --  67* 61*  --  58*  HEPARINUNFRC  --   --  1.07*  --   --   --  0.68  --   --  0.55  CREATININE 1.95* 1.50*  --   --   --   --  1.93*  --  2.00*  --   TROPONINI  --   --   --   --  0.28* 0.23* 0.22*  --   --   --   < > = values in this interval not displayed.  Estimated Creatinine Clearance: 39.3 mL/min (by C-G formula based on Cr of 2).   Medications:  Heparin @ 1250 units/hr  Assessment: 54yof on xarelto for afib, decompensated yesterday and was intubated. Xarelto held and switched to IV heparin (last xarelto  dose 1/9 PM). Heparin level down to 0.55 but aPTT remains slightly below goal. Anticipate both levels to correlate on next lab check.   Goal of Therapy:  APTT 66-102 seconds Heparin level 0.3-0.7 units/ml Monitor platelets by anticoagulation protocol: Yes   Plan:  1) Increase heparin to 1300 units/hr 2) Check heparin level and aPTT with AM labs   Albertina Parr, PharmD., BCPS Clinical Pharmacist Pager [phone]

## 2015-11-25 NOTE — Progress Notes (Signed)
Central line dressing changed

## 2015-11-26 ENCOUNTER — Inpatient Hospital Stay (HOSPITAL_COMMUNITY): Payer: Medicare Other

## 2015-11-26 DIAGNOSIS — J9602 Acute respiratory failure with hypercapnia: Secondary | ICD-10-CM

## 2015-11-26 LAB — BASIC METABOLIC PANEL
Anion gap: 9 (ref 5–15)
BUN: 75 mg/dL — AB (ref 6–20)
CALCIUM: 8.3 mg/dL — AB (ref 8.9–10.3)
CHLORIDE: 98 mmol/L — AB (ref 101–111)
CO2: 33 mmol/L — AB (ref 22–32)
CREATININE: 1.82 mg/dL — AB (ref 0.44–1.00)
GFR calc Af Amer: 35 mL/min — ABNORMAL LOW (ref >60)
GFR calc non Af Amer: 31 mL/min — ABNORMAL LOW (ref >60)
Glucose, Bld: 185 mg/dL — ABNORMAL HIGH (ref 65–99)
Potassium: 3.1 mmol/L — ABNORMAL LOW (ref 3.5–5.1)
SODIUM: 140 mmol/L (ref 135–145)

## 2015-11-26 LAB — CBC
HCT: 26.5 % — ABNORMAL LOW (ref 36.0–46.0)
HEMOGLOBIN: 8.2 g/dL — AB (ref 12.0–15.0)
MCH: 28.3 pg (ref 26.0–34.0)
MCHC: 30.9 g/dL (ref 30.0–36.0)
MCV: 91.4 fL (ref 78.0–100.0)
Platelets: 278 10*3/uL (ref 150–400)
RBC: 2.9 MIL/uL — ABNORMAL LOW (ref 3.87–5.11)
RDW: 14.9 % (ref 11.5–15.5)
WBC: 13.7 10*3/uL — ABNORMAL HIGH (ref 4.0–10.5)

## 2015-11-26 LAB — CULTURE, RESPIRATORY

## 2015-11-26 LAB — URIC ACID: URIC ACID, SERUM: 15 mg/dL — AB (ref 2.3–6.6)

## 2015-11-26 LAB — GLUCOSE, CAPILLARY
GLUCOSE-CAPILLARY: 138 mg/dL — AB (ref 65–99)
Glucose-Capillary: 132 mg/dL — ABNORMAL HIGH (ref 65–99)
Glucose-Capillary: 155 mg/dL — ABNORMAL HIGH (ref 65–99)
Glucose-Capillary: 168 mg/dL — ABNORMAL HIGH (ref 65–99)
Glucose-Capillary: 179 mg/dL — ABNORMAL HIGH (ref 65–99)
Glucose-Capillary: 203 mg/dL — ABNORMAL HIGH (ref 65–99)

## 2015-11-26 LAB — HEPARIN LEVEL (UNFRACTIONATED): HEPARIN UNFRACTIONATED: 0.51 [IU]/mL (ref 0.30–0.70)

## 2015-11-26 LAB — CARBOXYHEMOGLOBIN
Carboxyhemoglobin: 1 % (ref 0.5–1.5)
METHEMOGLOBIN: 0.8 % (ref 0.0–1.5)
O2 Saturation: 49.1 %
Total hemoglobin: 8.1 g/dL — ABNORMAL LOW (ref 12.0–16.0)

## 2015-11-26 LAB — CULTURE, RESPIRATORY W GRAM STAIN: Culture: NORMAL

## 2015-11-26 LAB — APTT: aPTT: 60 seconds — ABNORMAL HIGH (ref 24–37)

## 2015-11-26 MED ORDER — DOPAMINE-DEXTROSE 3.2-5 MG/ML-% IV SOLN
2.5000 ug/kg/min | INTRAVENOUS | Status: DC
  Administered 2015-11-26: 2.5 ug/kg/min via INTRAVENOUS
  Filled 2015-11-26: qty 250

## 2015-11-26 MED ORDER — HYDRALAZINE HCL 20 MG/ML IJ SOLN: 10.0000 mg | INTRAMUSCULAR | Status: DC | PRN

## 2015-11-26 MED ORDER — FENTANYL CITRATE (PF) 100 MCG/2ML IJ SOLN: 12.5000 ug | INTRAMUSCULAR | Status: DC | PRN

## 2015-11-26 MED ORDER — CETYLPYRIDINIUM CHLORIDE 0.05 % MT LIQD
7.0000 mL | Freq: Two times a day (BID) | OROMUCOSAL | Status: DC
  Administered 2015-11-26 – 2015-12-02 (×11): 7 mL via OROMUCOSAL

## 2015-11-26 MED ORDER — POTASSIUM CHLORIDE 10 MEQ/50ML IV SOLN
10.0000 meq | INTRAVENOUS | Status: AC
  Administered 2015-11-26 (×4): 10 meq via INTRAVENOUS
  Filled 2015-11-26 (×4): qty 50

## 2015-11-26 MED ORDER — CHLORHEXIDINE GLUCONATE 0.12 % MT SOLN
15.0000 mL | Freq: Two times a day (BID) | OROMUCOSAL | Status: DC
  Administered 2015-11-26 – 2015-12-03 (×14): 15 mL via OROMUCOSAL
  Filled 2015-11-26 (×12): qty 15

## 2015-11-26 MED ORDER — METOLAZONE 2.5 MG PO TABS
2.5000 mg | ORAL_TABLET | Freq: Once | ORAL | Status: AC
  Administered 2015-11-26: 2.5 mg via ORAL
  Filled 2015-11-26: qty 1

## 2015-11-26 NOTE — Progress Notes (Signed)
ANTICOAGULATION CONSULT NOTE - Follow Up Consult  Pharmacy Consult for Heparin Indication: atrial fibrillation  Allergies  Allergen Reactions  . Amiodarone Nausea And Vomiting    Patient Measurements: Height: 5\' 4"  (162.6 cm) Weight: 242 lb 1 oz (109.8 kg) IBW/kg (Calculated) : 54.7 Heparin Dosing Weight: 79kg  Vital Signs: Temp: 98.2 F (36.8 C) (01/12 0834) Temp Source: Oral (01/12 0834) BP: 120/73 mmHg (01/12 0834) Pulse Rate: 61 (01/12 0834)  Labs:  Recent Labs  11/24/15 0530  11/24/15 1808 11/24/15 2300 11/25/15 0430 11/25/15 1300 11/25/15 1752 11/25/15 1940 11/26/15 0400 11/26/15 0700  HGB 9.1*  --   --   --  8.0*  --   --   --  8.2*  --   HCT 29.1*  --   --   --  25.8*  --   --   --  26.5*  --   PLT 290  --   --   --  267  --   --   --  278  --   APTT  --   < >  --   --  67* 61*  --  58* 60*  --   HEPARINUNFRC  --   < >  --   --  0.68  --   --  0.55  --  0.51  CREATININE 1.50*  --   --   --  1.93*  --  2.00*  --  1.82*  --   TROPONINI  --   --  0.28* 0.23* 0.22*  --   --   --   --   --   < > = values in this interval not displayed.  Estimated Creatinine Clearance: 43.3 mL/min (by C-G formula based on Cr of 1.82).   Medications:  Heparin @ 1300 units/hr  Assessment: 54yof on xarelto for afib, decompensated 1/10 and was intubated. Xarelto held and switched to IV heparin (last xarelto dose 1/9 PM). Heparin level is therapeutic at 0.51 today and has been therapeutic x 3, so xarelto has likely cleared. Hemoglobin low but stable. No bleeding reported.  Goal of Therapy:  Heparin level 0.3-0.7 units/ml Monitor platelets by anticoagulation protocol: Yes   Plan:  1) Continue heparin at 1300 units/hr 2) Daily heparin level and CBC  Nena Jordan, PharmD., BCPS 11/26/2015, 8:50 AM

## 2015-11-26 NOTE — Progress Notes (Signed)
Fentanyl 200 ml wasted in the sink. Allegra Grana witnessed waste.

## 2015-11-26 NOTE — Progress Notes (Signed)
PULMONARY / CRITICAL CARE MEDICINE   Name: SCHERRI MESSERSMITH MRN: [id number] DOB: 17-May-1961    ADMISSION DATE:  11/23/2015 CONSULTATION DATE:  1/10  REFERRING MD:  Cards   SUBJECTIVE:   Passing SBT Breathing better  Didn't diurese   VITAL SIGNS: BP 164/63 mmHg  Pulse 78  Temp(Src) 98.2 F (36.8 C) (Oral)  Resp 20  Ht 5\' 4"  (1.626 m)  Wt 242 lb 1 oz (109.8 kg)  BMI 41.53 kg/m2  SpO2 89%  LMP 11/27/2012  HEMODYNAMICS: CVP:  [10 mmHg-17 mmHg] 11 mmHg  VENTILATOR SETTINGS: Vent Mode:  [-] PSV;CPAP FiO2 (%):  [40 %] 40 % Set Rate:  [24 bmp] 24 bmp Vt Set:  [440 mL] 440 mL PEEP:  [5 cmH20] 5 cmH20 Pressure Support:  [5 cmH20] 5 cmH20 Plateau Pressure:  [26 cmH20-30 cmH20] 28 cmH20  INTAKE / OUTPUT: I/O last 3 completed shifts: In: 2673.6 [I.V.:1493.6; NG/GT:1080; IV Piggyback:100] Out: 2350 [Urine:2350]  PHYSICAL EXAMINATION:  Gen: comfortable on vent HEENT: NCAT, EOMI PULM: WHEEZING improved, remains comfortabl on vent support CV: RRR, no mur GI: BS+, soft, nontender Ext: trace edema, warm Neuro: A&Ox4, following commands  LABS:  BMET  Recent Labs Lab 11/25/15 0430 11/25/15 1752 11/26/15 0400  NA 138 140 140  K 3.8 3.7 3.1*  CL 96* 96* 98*  CO2 32 32 33*  BUN 53* 68* 75*  CREATININE 1.93* 2.00* 1.82*  GLUCOSE 185* 204* 185*    Electrolytes  Recent Labs Lab 11/25/15 0430 11/25/15 1752 11/26/15 0400  CALCIUM 8.3* 8.4* 8.3*    CBC  Recent Labs Lab 11/24/15 0530 11/25/15 0430 11/26/15 0400  WBC 10.2 8.9 13.7*  HGB 9.1* 8.0* 8.2*  HCT 29.1* 25.8* 26.5*  PLT 290 267 278    Coag's  Recent Labs Lab 11/25/15 1300 11/25/15 1940 11/26/15 0400  APTT 61* 58* 60*    Sepsis Markers No results for input(s): LATICACIDVEN, PROCALCITON, O2SATVEN in the last 168 hours.  ABG  Recent Labs Lab 11/24/15 0251 11/24/15 1150  PHART 7.400 7.291*  PCO2ART 55.9* 79.6*  PO2ART 92.0 543.0*    Liver Enzymes  Recent Labs Lab  11/24/15 0530  AST 41  ALT 54  ALKPHOS 216*  BILITOT 0.6  ALBUMIN 3.0*    Cardiac Enzymes  Recent Labs Lab 11/24/15 1808 11/24/15 2300 11/25/15 0430  TROPONINI 0.28* 0.23* 0.22*    Glucose  Recent Labs Lab 11/25/15 1129 11/25/15 1610 11/25/15 1957 11/26/15 0021 11/26/15 0325 11/26/15 0832  GLUCAP 192* 187* 221* 203* 179* 132*    Imaging  1/12 CXR > bilateral air space disease unchanged  STUDIES:    CULTURES: 1/11 bc x 2>> 1/10 UC>> 1/10 Sputum>>  ANTIBIOTICS: 1/9 levaquin>>  SIGNIFICANT EVENTS: 1/10 Acute resp failure, tx to ICU and intubated urgently  LINES/TUBES: 1/10 ET>> 1/10 Left I J CVL>>  DISCUSSION: 55 yo wf with acute resp distress with a fib rvr, decompensated with increased WOB requiring intubation on 1/10. Now diuresing well, passing SBT.  ASSESSMENT / PLAN:  PULMONARY A: Acute resp failure, suspect due primarily to CHF in setting RVR, superimposed COPD Acute pulmonary edema due to CHF acute decompensated Asthma?  with continued tobacco abuse, no obstruction on PFT, no emphysema on CT chest P:   Extubate today Continue prednisone Continue bronchodilators May need high flow O2  CARDIOVASCULAR A:  CHF, acute decompensated A fib with RVR HTN Poor compliance  P:  Amio per cards Diuresis per cardiology, monitor UOP and renal fxn, BP Add  hydralazine prn for SBP > 150. CVP monitoring  RENAL  A:   AKI P:   Avoid nephrotoxins Monitor BMET and UOP Replace electrolytes as needed   GASTROINTESTINAL A:   GI protection P:   PPI Senna Colace  HEMATOLOGIC A:   Anticoagulation for A fib P:  Heparin drip per Cards; was on xarelto at home > holding  INFECTIOUS A:   Presumed bronchitis P:   Levaquin   ENDOCRINE A:   Poorly controlled DM P:   SSI  NEUROLOGIC A:   No acute issues P:   RASS goal: O Stop sedation  My CC time 35 minutes   Roselie Awkward, MD [place] [facility] Pager: [phone] Cell:  [phone] After 3pm or if no response, call [phone]

## 2015-11-26 NOTE — Procedures (Signed)
Extubation Procedure Note  Patient Details:   Name: [name] DOB: 1961/08/02 MRN: [id number]   Airway Documentation:     Evaluation  O2 sats: stable throughout Complications: No apparent complications Patient did tolerate procedure well. Bilateral Breath Sounds: Clear, Expiratory wheezes Suctioning: Airway Yes   Patient extubated to 6L nasal cannula per MD order.  Positive cuff leak noted.  No evidence of stridor.  Patient able to speak post extubation.  Sats currently at 90%.  Vitals are stable.  No apparent complications.  Alphia Moh N 11/26/2015, 2:30 PM

## 2015-11-26 NOTE — Progress Notes (Signed)
Patient ID: [name], female   DOB: 07-17-1961, 55 y.o.   MRN: [id number]    Advanced Heart Failure Rounding Note   Subjective:    She was intubated 1/10 for hypoxemic respiratory failure and increased workup of breathing.  She converted back to NSR on amiodarone gtt and remains on amiodarone gtt.    Yesterday she was transitioned to Lasix gtt. Limited diuresis noted. Remained off pressors. Todays CO-OX 49% and CVP remains elevated 13-14.   Echo (11/24/15) showed EF 45% with inferolateral hypokinesis and mildly decreased RV systolic function.  This is similar to prior echo from 12/15.    Most recent [facility] 10/31/2014: Large caliber vessel with 30% mid stenosis. The first obtuse marginal branch was a small caliber vessel (1.5 mm) with diffuse 80% stenosis in the proximal and mid segment. The second obtuse marginal branch is small in caliber with 40% mid stenosis. The left posterolateral branch has mild diffuse plaque.    Objective:   Weight Range:  Vital Signs:   Temp:  [97.4 F (36.3 C)-98.8 F (37.1 C)] 98.1 F (36.7 C) (01/12 0326) Pulse Rate:  [52-80] 54 (01/12 0600) Resp:  [15-25] 24 (01/12 0600) BP: (93-132)/(54-121) 98/63 mmHg (01/12 0600) SpO2:  [96 %-100 %] 100 % (01/12 0600) FiO2 (%):  [40 %-50 %] 40 % (01/12 0400) Weight:  [242 lb 1 oz (109.8 kg)] 242 lb 1 oz (109.8 kg) (01/12 0500) Last BM Date: 11/23/15  Weight change: Filed Weights   11/24/15 0322 11/25/15 0427 11/26/15 0500  Weight: 229 lb 3.2 oz (103.964 kg) 241 lb 2.9 oz (109.4 kg) 242 lb 1 oz (109.8 kg)    Intake/Output:   Intake/Output Summary (Last 24 hours) at 11/26/15 0704 Last data filed at 11/26/15 0700  Gross per 24 hour  Intake 1768.98 ml  Output   1750 ml  Net  18.98 ml     Physical Exam: CVP 13 General:  Intubated, awake.    HEENT: normal Neck: LIJ Thick. JVP hard to assess but appears elevated.  Cor: PMI nondisplaced. Regular S1S2. No rubs, gallops or murmurs. Lungs: Soft lung  sounds, mechanically ventilated.   Abdomen: soft, nontender, nondistended. No hepatosplenomegaly. No bruits or masses. Good bowel sounds. Extremities: no cyanosis, clubbing, rash. 1+ edema 1/2 to knees bilaterally. R and LLE anterior aspect erythema.  Neuro: Alert on vent, follows commands.   Telemetry:  NSR 70s  Labs: Basic Metabolic Panel:  Recent Labs Lab 11/23/15 2234 11/24/15 0530 11/25/15 0430 11/25/15 1752  NA 135 137 138 140  K 4.1 3.9 3.8 3.7  CL 88* 96* 96* 96*  CO2 31 31 32 32  GLUCOSE 424* 342* 185* 204*  BUN 32* 33* 53* 68*  CREATININE 1.95* 1.50* 1.93* 2.00*  CALCIUM 8.8* 7.9* 8.3* 8.4*    Liver Function Tests:  Recent Labs Lab 11/24/15 0530  AST 41  ALT 54  ALKPHOS 216*  BILITOT 0.6  PROT 6.8  ALBUMIN 3.0*   No results for input(s): LIPASE, AMYLASE in the last 168 hours. No results for input(s): AMMONIA in the last 168 hours.  CBC:  Recent Labs Lab 11/23/15 2234 11/24/15 0530 11/25/15 0430 11/26/15 0400  WBC 11.0* 10.2 8.9 13.7*  NEUTROABS  --  8.4*  --   --   HGB 10.8* 9.1* 8.0* 8.2*  HCT 34.1* 29.1* 25.8* 26.5*  MCV 92.7 92.1 91.5 91.4  PLT 395 290 267 278    Cardiac Enzymes:  Recent Labs Lab 11/24/15 1808 11/24/15 2300 11/25/15  0430  TROPONINI 0.28* 0.23* 0.22*    BNP: BNP (last 3 results)  Recent Labs  06/23/15 0005 10/15/15 1355 11/23/15 2234  BNP 539.4* 246.5* 523.5*    ProBNP (last 3 results) No results for input(s): PROBNP in the last 8760 hours.    Other results:  Imaging: Dg Chest Port 1 View  11/25/2015  CLINICAL DATA:  Acute respiratory failure. EXAM: PORTABLE CHEST 1 VIEW COMPARISON:  11/24/2015. FINDINGS: Endotracheal tube, left IJ line, NG tube and good anatomic position. Right base subsegmental atelectasis and or mild infiltrate. No pleural effusion or pneumothorax. Heart size stable. No acute bony abnormality . IMPRESSION: 1. Lines and tubes in stable position. 2. Right base subsegmental atelectasis  and or mild infiltrate. Electronically Signed   By: Marcello Moores  Register   On: 11/25/2015 07:16   Dg Chest Port 1 View  11/24/2015  CLINICAL DATA:  Status post intubation and central line placement EXAM: PORTABLE CHEST - 1 VIEW COMPARISON:  11/23/2015 FINDINGS: Endotracheal tube is now seen approximately 2.9 cm above the carina. A nasogastric catheter courses towards the stomach. Left central venous line is noted in the proximal superior vena cava. No pneumothorax is noted. Cardiac shadow remains enlarged accentuated by the portable technique. The lungs are clear bilaterally. IMPRESSION: Tubes and lines as described above in satisfactory position. No acute abnormality noted. Electronically Signed   By: Inez Catalina M.D.   On: 11/24/2015 11:11   Dg Abd Portable 1v  11/24/2015  CLINICAL DATA:  Status post gastric catheter placement EXAM: PORTABLE ABDOMEN - 1 VIEW COMPARISON:  None. FINDINGS: Gastric catheter is noted within the stomach. Scattered large and small bowel gas is noted. No obstructive changes are seen. No free air is noted. IMPRESSION: Gastric catheter within the stomach. Electronically Signed   By: Inez Catalina M.D.   On: 11/24/2015 11:13     Medications:     Scheduled Medications: . antiseptic oral rinse  7 mL Mouth Rinse QID  . atorvastatin  40 mg Oral Daily  . atropine  2 drop Sublingual QID  . chlorhexidine gluconate  15 mL Mouth Rinse BID  . cholecalciferol  2,000 Units Oral Daily  . feeding supplement (VITAL HIGH PROTEIN)  1,000 mL Per Tube Q24H  . folic acid  1 mg Oral Daily  . gabapentin  300 mg Oral TID AC & HS  . insulin aspart  0-20 Units Subcutaneous 6 times per day  . insulin aspart  10 Units Subcutaneous 6 times per day  . insulin glargine  60 Units Subcutaneous QHS  . levofloxacin (LEVAQUIN) IV  750 mg Intravenous Q48H  . levothyroxine  50 mcg Oral QAC breakfast  . pantoprazole sodium  40 mg Per Tube Daily  . predniSONE  40 mg Per Tube Q breakfast  . senna-docusate   1 tablet Per Tube BID  . tiotropium  18 mcg Inhalation Daily    Infusions: . amiodarone 30 mg/hr (11/26/15 0533)  . fentaNYL infusion INTRAVENOUS 25 mcg/hr (11/26/15 0440)  . furosemide (LASIX) infusion 10 mg/hr (11/25/15 0950)  . heparin 1,300 Units/hr (11/25/15 2119)    PRN Medications: acetaminophen, acetaminophen-codeine, albuterol, ALPRAZolam, fentaNYL (SUBLIMAZE) injection, fentaNYL (SUBLIMAZE) injection, midazolam, midazolam, ondansetron (ZOFRAN) IV   Assessment/Plan :   1. Acute hypoxemic respiratory failure: Suspect combination of CHF and COPD exacerbation, wheezing initially and moving air poorly => intubated 1/10.  - Treating with IV Lasix, nebs, steroids, and antibiotics.  2. Atrial fibrillation with RVR: History of PAF, on Xarelto at home.  Says she has not missed any doses.  HR in 150s initially, started amiodarone gtt with eventual conversion to NSR, remains in NSR today.  - Continue amiodarone gtt today.  - Continue anticoagulation, will use IV heparin gtt for now, transition back to [place] eventually.   3. Acute on chronic systolic CHF: EF [id number] on echo with inferolateral hypokinesis and mildly decreased RV systolic function.  She had a similar echo pattern in 12/15 and cardiac cath in 12/15 with nonobstructive and relatively mild CAD.  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  She briefly required norepinephrine, may have been due to sedation (dropped BP after intubation), now stable BP off pressors.  She remains volume overloaded, CVP remains 13-14 and poor diuresis noted. Weight up 1 pound. Co-ox low at 49%. - Continue lasix drip 10 mg per hour, creatinine fairly stable at 1.8 though BUN rising. - With low co-ox and poor diuresis, add 2.5 mcg/kg/min dopamine and give one dose of metolazone 2.5. Supplement K. Give K runs.   - Off Coreg with wheezing and decompensated CHF.  - Hold lisinopril with AKI.  4. COPD: Suspect exacerbation.  Wheezing on  exam prior to intubation.  Nebs, steroids, abx per CCM.   5. PAD: Extensive PAD history.  She has been on ASA, Plavix and Xarelto at home. Last peripheral PCI was in 2014.  She is off ASA and Plavix as there is likely no benefit beyond her anticoagulation alone and will make her more prone to pathologic bleeding.  6. AKI: Creatinine  1.82, follow closely with diuresis (BUN rising).  7. Anemia: Chronic, lower today.  As above, stopping Plavix and ASA, will continue anticoagulation with heparin for now, eventually transition back to Xarelto.  8. Cirrhosis: ETOH-related per report.  9. CAD: Nonobstructive on 12/15 cath.  TnI mildly elevated this admission, max 0.28 with minimal trend.  Suspect most likely demand ischemia with hypoxemia and volume overload.  Echo wall motion pattern this admission is similar to the prior echo in 12/15.  Would avoid cath for now, especially with AKI.   [name] Clegg Np-C  11/26/2015 7:04 AM   Patient seen with NP, agree with the above note.  She remains intubated with elevated CVP.  She diuresed poorly yesterday despite Lasix gtt.  Creatinine stable, BUN rising.  EF 45% on echo, co-ox low at 49%.  Difficult situation in terms of diuresis.  I think that it is reasonable to try low dose dopamine to augment diuresis and enable clearing of lungs for extubation.  Will give dose of metolazone after dopamine begun. Will have to watch for recurrent atrial fibrillation, would continue IV amiodarone while on dopamine.   35 minutes critical care time.   Loralie Champagne 11/26/2015 9:00 AM

## 2015-11-26 NOTE — Progress Notes (Signed)
Inpatient Diabetes Program Recommendations  AACE/ADA: New Consensus Statement on Inpatient Glycemic Control (2015)  Target Ranges:  Prepandial:   less than 140 mg/dL      Peak postprandial:   less than 180 mg/dL (1-2 hours)      Critically ill patients:  140 - 180 mg/dL  Results for [name], [name] (MRN [id number]) as of 11/26/2015 11:20  Ref. Range 11/25/2015 03:45 11/25/2015 08:28 11/25/2015 11:29 11/25/2015 16:10 11/25/2015 19:57 11/26/2015 00:21 11/26/2015 03:25 11/26/2015 08:32  Glucose-Capillary Latest Ref Range: 65-99 mg/dL 165 (H) 170 (H) 192 (H) 187 (H) 221 (H) 203 (H) 179 (H) 132 (H)   Review of Glycemic Control  Current orders for Inpatient glycemic control: Lantus 60 units QHS, Novolog 0-20 units Q4H, Novolog 10 units Q4H for tube feeding coverage  Inpatient Diabetes Program Recommendations: Insulin - Meal Coverage: If tube feeding continues at current rate and steroids are continued as ordered, please consider increasing tube feeding coverage to 12 units Q4H.  Thanks, Barnie Alderman, RN, MSN, CDE Diabetes Coordinator Inpatient Diabetes Program [phone] (Team Pager from [place] to [place]) [phone] ([place] office) [phone] [facility] office) [phone] [facility] office)

## 2015-11-27 ENCOUNTER — Ambulatory Visit (HOSPITAL_COMMUNITY): Payer: Medicaid Other

## 2015-11-27 ENCOUNTER — Inpatient Hospital Stay (HOSPITAL_COMMUNITY): Payer: Medicare Other

## 2015-11-27 ENCOUNTER — Encounter (HOSPITAL_COMMUNITY): Admission: RE | Admit: 2015-11-27 | Payer: Medicare Other | Source: Ambulatory Visit

## 2015-11-27 ENCOUNTER — Encounter: Payer: Self-pay | Admitting: Surgery

## 2015-11-27 DIAGNOSIS — J9601 Acute respiratory failure with hypoxia: Secondary | ICD-10-CM | POA: Insufficient documentation

## 2015-11-27 LAB — CBC WITH DIFFERENTIAL/PLATELET
BASOS ABS: 0 10*3/uL (ref 0.0–0.1)
BASOS PCT: 0 %
Eosinophils Absolute: 0 10*3/uL (ref 0.0–0.7)
Eosinophils Relative: 0 %
HEMATOCRIT: 30.1 % — AB (ref 36.0–46.0)
HEMOGLOBIN: 9.3 g/dL — AB (ref 12.0–15.0)
Lymphocytes Relative: 25 %
Lymphs Abs: 3.7 10*3/uL (ref 0.7–4.0)
MCH: 28.4 pg (ref 26.0–34.0)
MCHC: 30.9 g/dL (ref 30.0–36.0)
MCV: 91.8 fL (ref 78.0–100.0)
Monocytes Absolute: 1 10*3/uL (ref 0.1–1.0)
Monocytes Relative: 7 %
NEUTROS ABS: 9.8 10*3/uL — AB (ref 1.7–7.7)
NEUTROS PCT: 68 %
Platelets: 304 10*3/uL (ref 150–400)
RBC: 3.28 MIL/uL — ABNORMAL LOW (ref 3.87–5.11)
RDW: 14.6 % (ref 11.5–15.5)
WBC: 14.5 10*3/uL — ABNORMAL HIGH (ref 4.0–10.5)

## 2015-11-27 LAB — BASIC METABOLIC PANEL
ANION GAP: 11 (ref 5–15)
BUN: 57 mg/dL — ABNORMAL HIGH (ref 6–20)
CALCIUM: 8.9 mg/dL (ref 8.9–10.3)
CO2: 40 mmol/L — ABNORMAL HIGH (ref 22–32)
Chloride: 90 mmol/L — ABNORMAL LOW (ref 101–111)
Creatinine, Ser: 1.46 mg/dL — ABNORMAL HIGH (ref 0.44–1.00)
GFR, EST AFRICAN AMERICAN: 46 mL/min — AB (ref >60)
GFR, EST NON AFRICAN AMERICAN: 40 mL/min — AB (ref >60)
Glucose, Bld: 169 mg/dL — ABNORMAL HIGH (ref 65–99)
Potassium: 3.1 mmol/L — ABNORMAL LOW (ref 3.5–5.1)
Sodium: 141 mmol/L (ref 135–145)

## 2015-11-27 LAB — POCT I-STAT 3, ART BLOOD GAS (G3+)
ACID-BASE EXCESS: 18 mmol/L — AB (ref 0.0–2.0)
Bicarbonate: 43 mEq/L — ABNORMAL HIGH (ref 20.0–24.0)
O2 Saturation: 93 %
PCO2 ART: 54 mmHg — AB (ref 35.0–45.0)
PH ART: 7.509 — AB (ref 7.350–7.450)
PO2 ART: 63 mmHg — AB (ref 80.0–100.0)
Patient temperature: 98.7
TCO2: 45 mmol/L (ref 0–100)

## 2015-11-27 LAB — GLUCOSE, CAPILLARY
GLUCOSE-CAPILLARY: 162 mg/dL — AB (ref 65–99)
GLUCOSE-CAPILLARY: 379 mg/dL — AB (ref 65–99)
GLUCOSE-CAPILLARY: 401 mg/dL — AB (ref 65–99)
Glucose-Capillary: 152 mg/dL — ABNORMAL HIGH (ref 65–99)
Glucose-Capillary: 164 mg/dL — ABNORMAL HIGH (ref 65–99)
Glucose-Capillary: 267 mg/dL — ABNORMAL HIGH (ref 65–99)
Glucose-Capillary: 72 mg/dL (ref 65–99)

## 2015-11-27 LAB — HEPARIN LEVEL (UNFRACTIONATED): HEPARIN UNFRACTIONATED: 0.57 [IU]/mL (ref 0.30–0.70)

## 2015-11-27 MED ORDER — RIVAROXABAN 20 MG PO TABS
20.0000 mg | ORAL_TABLET | Freq: Every day | ORAL | Status: DC
  Administered 2015-11-27 – 2015-11-29 (×3): 20 mg via ORAL
  Filled 2015-11-27 (×4): qty 1

## 2015-11-27 MED ORDER — FUROSEMIDE 80 MG PO TABS
80.0000 mg | ORAL_TABLET | Freq: Two times a day (BID) | ORAL | Status: DC
  Administered 2015-11-27 – 2015-11-28 (×2): 80 mg via ORAL
  Filled 2015-11-27 (×4): qty 1

## 2015-11-27 MED ORDER — INSULIN ASPART 100 UNIT/ML ~~LOC~~ SOLN
10.0000 [IU] | Freq: Three times a day (TID) | SUBCUTANEOUS | Status: DC
  Administered 2015-11-27 – 2015-12-01 (×13): 10 [IU] via SUBCUTANEOUS

## 2015-11-27 MED ORDER — INSULIN ASPART 100 UNIT/ML ~~LOC~~ SOLN
0.0000 [IU] | Freq: Three times a day (TID) | SUBCUTANEOUS | Status: DC
  Administered 2015-11-27 (×2): 20 [IU] via SUBCUTANEOUS
  Administered 2015-11-27: 4 [IU] via SUBCUTANEOUS
  Administered 2015-11-28: 20 [IU] via SUBCUTANEOUS
  Administered 2015-11-28: 7 [IU] via SUBCUTANEOUS
  Administered 2015-11-28: 20 [IU] via SUBCUTANEOUS
  Administered 2015-11-28: 3 [IU] via SUBCUTANEOUS
  Administered 2015-11-29: 4 [IU] via SUBCUTANEOUS
  Administered 2015-11-29: 20 [IU] via SUBCUTANEOUS
  Administered 2015-11-29: 15 [IU] via SUBCUTANEOUS
  Administered 2015-11-29: 3 [IU] via SUBCUTANEOUS
  Administered 2015-11-30: 20 [IU] via SUBCUTANEOUS
  Administered 2015-11-30: 15 [IU] via SUBCUTANEOUS
  Administered 2015-11-30: 3 [IU] via SUBCUTANEOUS
  Administered 2015-11-30: 7 [IU] via SUBCUTANEOUS
  Administered 2015-12-01: 20 [IU] via SUBCUTANEOUS
  Administered 2015-12-01: 11 [IU] via SUBCUTANEOUS
  Administered 2015-12-01: 7 [IU] via SUBCUTANEOUS
  Administered 2015-12-01: 20 [IU] via SUBCUTANEOUS
  Administered 2015-12-02: 15 [IU] via SUBCUTANEOUS
  Administered 2015-12-02: 3 [IU] via SUBCUTANEOUS
  Administered 2015-12-02 – 2015-12-03 (×4): 4 [IU] via SUBCUTANEOUS

## 2015-11-27 MED ORDER — LEVOFLOXACIN 500 MG PO TABS
500.0000 mg | ORAL_TABLET | Freq: Every day | ORAL | Status: AC
  Administered 2015-11-27 – 2015-11-30 (×4): 500 mg via ORAL
  Filled 2015-11-27 (×5): qty 1

## 2015-11-27 MED ORDER — PREDNISONE 10 MG PO TABS
10.0000 mg | ORAL_TABLET | Freq: Every day | ORAL | Status: AC
Start: 2015-11-30 — End: 2015-12-01
  Administered 2015-11-30 – 2015-12-01 (×2): 10 mg via ORAL
  Filled 2015-11-27 (×2): qty 1

## 2015-11-27 MED ORDER — PANTOPRAZOLE SODIUM 40 MG PO TBEC
40.0000 mg | DELAYED_RELEASE_TABLET | Freq: Every day | ORAL | Status: DC
Start: 2015-11-27 — End: 2015-12-03
  Administered 2015-11-27 – 2015-12-03 (×7): 40 mg via ORAL
  Filled 2015-11-27 (×7): qty 1

## 2015-11-27 MED ORDER — PREDNISONE 20 MG PO TABS
20.0000 mg | ORAL_TABLET | Freq: Every day | ORAL | Status: AC
  Administered 2015-11-29: 20 mg via ORAL
  Filled 2015-11-27: qty 1

## 2015-11-27 MED ORDER — PREDNISONE 5 MG PO TABS
5.0000 mg | ORAL_TABLET | Freq: Every day | ORAL | Status: AC
Start: 2015-12-02 — End: 2015-12-02
  Administered 2015-12-02: 5 mg via ORAL
  Filled 2015-11-27: qty 1

## 2015-11-27 MED ORDER — POTASSIUM CHLORIDE 10 MEQ/50ML IV SOLN
10.0000 meq | INTRAVENOUS | Status: AC
  Administered 2015-11-27 (×4): 10 meq via INTRAVENOUS
  Filled 2015-11-27 (×4): qty 50

## 2015-11-27 MED ORDER — BUDESONIDE-FORMOTEROL FUMARATE 160-4.5 MCG/ACT IN AERO
2.0000 | INHALATION_SPRAY | Freq: Two times a day (BID) | RESPIRATORY_TRACT | Status: DC
  Administered 2015-11-27 – 2015-12-03 (×9): 2 via RESPIRATORY_TRACT
  Filled 2015-11-27: qty 6

## 2015-11-27 MED ORDER — POTASSIUM CHLORIDE CRYS ER 20 MEQ PO TBCR
40.0000 meq | EXTENDED_RELEASE_TABLET | Freq: Once | ORAL | Status: AC
  Administered 2015-11-27: 40 meq via ORAL
  Filled 2015-11-27: qty 2

## 2015-11-27 MED ORDER — PREDNISONE 20 MG PO TABS
30.0000 mg | ORAL_TABLET | Freq: Every day | ORAL | Status: AC
  Administered 2015-11-28: 30 mg via ORAL
  Filled 2015-11-27 (×2): qty 1

## 2015-11-27 MED ORDER — INSULIN GLARGINE 100 UNIT/ML ~~LOC~~ SOLN
70.0000 [IU] | Freq: Every day | SUBCUTANEOUS | Status: DC
Start: 2015-11-27 — End: 2015-12-03
  Administered 2015-11-27 – 2015-12-02 (×6): 70 [IU] via SUBCUTANEOUS
  Filled 2015-11-27 (×11): qty 0.7

## 2015-11-27 MED ORDER — LEVOFLOXACIN IN D5W 750 MG/150ML IV SOLN
750.0000 mg | INTRAVENOUS | Status: DC
  Filled 2015-11-27: qty 150

## 2015-11-27 MED ORDER — BISOPROLOL FUMARATE 5 MG PO TABS
2.5000 mg | ORAL_TABLET | Freq: Every day | ORAL | Status: DC
  Administered 2015-11-27 – 2015-11-28 (×2): 2.5 mg via ORAL
  Filled 2015-11-27: qty 1
  Filled 2015-11-27: qty 0.5

## 2015-11-27 MED ORDER — AMIODARONE HCL 200 MG PO TABS
400.0000 mg | ORAL_TABLET | Freq: Two times a day (BID) | ORAL | Status: DC
  Administered 2015-11-27 – 2015-12-02 (×11): 400 mg via ORAL
  Filled 2015-11-27 (×12): qty 2

## 2015-11-27 MED ORDER — POTASSIUM CHLORIDE CRYS ER 20 MEQ PO TBCR
40.0000 meq | EXTENDED_RELEASE_TABLET | Freq: Every day | ORAL | Status: DC
  Administered 2015-11-28 – 2015-12-01 (×4): 40 meq via ORAL
  Filled 2015-11-27 (×4): qty 2

## 2015-11-27 NOTE — Progress Notes (Addendum)
PULMONARY / CRITICAL CARE MEDICINE   Name: [name] MRN: [id number] DOB: Nov 10, 1961    HISTORY OF PRESENT ILLNESS:  Patient states her SOB has improved. Denies having any cough, fevers, or chills.  PAST MEDICAL HISTORY :  Past Medical History  Diagnosis Date  . Alcohol abuse   . Narcotic abuse   . Cocaine abuse   . Marijuana abuse   . Tobacco abuse   . Alcoholic cirrhosis ([place])   . Cardiomyopathy ([place])   . Obesity   . Chronic combined systolic and diastolic CHF (congestive heart failure) ([place])     a. Last echo 12/205: EF 40-45%, inferoapical/posterior HK, not technically sufficient to allow eval of LV diastolic function, normal LA in size..  . Hypothyroidism   . GERD (gastroesophageal reflux disease)   . PAF (paroxysmal atrial fibrillation) ([place])   . Critical lower limb ischemia   . Hypertension   . CAD (coronary artery disease)     a. cath 11/10/2014 LM nl, LAD min irregs, D1 30 ost, D2 50d, LCX 66m OM1 80 p/m (1.5 mm vessel), OM2 471mRCA nondom 9013mmed rx.. Demand ischemia in the setting of rapid a-fib.  . AMarland Kitchenxiety   . Poorly controlled type 2 diabetes mellitus (HCCRome . Hyperlipemia   . Peripheral arterial disease ([place]CC[place]   a. 01/2015 Angio/PTA: LSFA 100 w/ recon @ adductor canal and 1 vessel runoff via AT, RSFA 99 (atherectomy/pta) - 1 vessel runoff via diff dzs peroneal.  . Carotid artery disease ([place]CC[place]   a. 01/2015 Carotid Angio: RICA 100697ICA [id number]/p L carotid endarterectomy 02/2015.  . CMarland KitchenPD (chronic obstructive pulmonary disease) (HCC[place] . Diabetic peripheral neuropathy ([place]CC[place] . Paroxysmal atrial tachycardia ([place]CC[place] . Noncompliance   . Anemia   . B12 deficiency   . Alcoholic cirrhosis (HC[place] . NSVT (nonsustained ventricular tachycardia) ([place]CC[place] . Paroxysmal atrial tachycardia (HCC[place]raham[place] . Hypokalemia   . Hypomagnesemia   . Renal insufficiency     a. 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  . EMarland Kitchenevated troponin     a. Chronic elevation.   Past Surgical History   Procedure Laterality Date  . Cardioversion  ~ 02/2013    "twice"   . Lower extremity angiogram N/A 09/10/2013    Procedure: LOWER EXTREMITY ANGIOGRAM;  Surgeon: JonLorretta HarpD;  [facility];  Service: Cardiovascular;  Laterality: N/A;  . Left heart catheterization with coronary angiogram N/A 10/31/2014    Procedure: LEFT HEART CATHETERIZATION WITH CORONARY ANGIOGRAM;  Surgeon: ChrBurnell BlanksD;  [facility];  Service: Cardiovascular;  Laterality: N/A;  . Peripheral athrectomy Right 01/15/2015    SFA/notes 01/15/2015  . Balloon angioplasty, artery Right 01/15/2015    SFA/notes 01/15/2015  . Cardiac catheterization    . Lower extremity angiogram N/A 01/15/2015    Procedure: LOWER EXTREMITY ANGIOGRAM;  Surgeon: JonLorretta HarpD;  [facility];  Service: Cardiovascular;  Laterality: N/A;  . Carotid angiogram N/A 01/15/2015    Procedure: CAROTID ANGIOGRAM;  Surgeon: JonLorretta HarpD;  [facility];  Service: Cardiovascular;  Laterality: N/A;  . Endarterectomy Left 02/19/2015    Procedure: LEFT CAROTID ENDARTERECTOMY ;  Surgeon: VanSerafina MitchellD;  [facility];  Service: Vascular;  Laterality: Left;  . Dilation and curettage of uterus  1988   Prior to Admission medications   Medication Sig Start Date End Date Taking? Authorizing Provider  acetaminophen (TYLENOL) 500 MG tablet Take 1,000 mg by mouth every 6 (six) hours as needed (pain).   Yes Historical Provider, MD  acetaminophen-codeine (TYLENOL #3) 300-30 MG tablet Take 1 tablet by mouth every 4 (four) hours as needed for moderate pain.   Yes Historical Provider, MD  albuterol (PROVENTIL HFA;VENTOLIN HFA) 108 (90 BASE) MCG/ACT inhaler Inhale 2 puffs into the lungs every 6 (six) hours as needed for wheezing or shortness of breath. 06/30/15  Yes Arnoldo Morale, MD  albuterol (PROVENTIL) (2.5 MG/3ML) 0.083% nebulizer solution Take 3 mLs (2.5 mg total) by nebulization every 6 (six) hours as needed.  For wheezing. 06/30/15  Yes Arnoldo Morale, MD  aspirin EC 81 MG tablet Take 81 mg by mouth daily.   Yes Historical Provider, MD  atorvastatin (LIPITOR) 40 MG tablet Take 1 tablet (40 mg total) by mouth daily. 06/30/15  Yes Arnoldo Morale, MD  Blood Glucose Monitoring Suppl (ACCU-CHEK AVIVA PLUS) W/DEVICE KIT 1 Device by Does not apply route 4 (four) times daily -  before meals and at bedtime. 07/21/15  Yes Arnoldo Morale, MD  carvedilol (COREG) 12.5 MG tablet Take 1 tablet (12.5 mg total) by mouth 2 (two) times daily with a meal. Patient taking differently: Take 6.25 mg by mouth 2 (two) times daily with a meal.  11/17/15  Yes Dayna N Dunn, PA-C  Cholecalciferol (VITAMIN D) 2000 UNITS tablet Take 2,000 Units by mouth daily.   Yes Historical Provider, MD  clopidogrel (PLAVIX) 75 MG tablet Take 1 tablet (75 mg total) by mouth daily with breakfast. 06/30/15  Yes Arnoldo Morale, MD  diltiazem (CARDIZEM CD) 180 MG 24 hr capsule Take 1 capsule (180 mg total) by mouth daily. 10/23/15  Yes Eileen Stanford, PA-C  folic acid (FOLVITE) 1 MG tablet Take 1 tablet (1 mg total) by mouth daily. 11/09/15  Yes Brittainy Erie Noe, PA-C  furosemide (LASIX) 40 MG tablet Take 1.5 tablets (60 mg total) by mouth 2 (two) times daily. 11/17/15  Yes Dayna N Dunn, PA-C  Gabapentin, Once-Daily, (GRALISE) 300 MG TABS Take 300 mg by mouth 4 (four) times daily.    Yes Historical Provider, MD  glipiZIDE (GLUCOTROL XL) 10 MG 24 hr tablet Take 2 tablets (20 mg total) by mouth daily with breakfast. 06/30/15  Yes Arnoldo Morale, MD  glucose blood (ACCU-CHEK AVIVA PLUS) test strip Use as instructed 07/21/15  Yes Arnoldo Morale, MD  insulin aspart (NOVOLOG) 100 UNIT/ML FlexPen Sliding scale Patient taking differently: Inject 6-14 Units into the skin 3 (three) times daily with meals. Per sliding scale 06/30/15  Yes Arnoldo Morale, MD  insulin glargine (LANTUS) 100 unit/mL SOPN Inject 60 Units into the skin at bedtime.   Yes Historical Provider, MD  Insulin Pen  Needle (ULTICARE MICRO PEN NEEDLES) 32G X 4 MM MISC 1 Syringe by Does not apply route 4 (four) times daily - after meals and at bedtime. 06/30/15  Yes Arnoldo Morale, MD  Lancets (ACCU-CHEK SOFT TOUCH) lancets Use as instructed 07/21/15  Yes Arnoldo Morale, MD  levothyroxine (SYNTHROID, LEVOTHROID) 50 MCG tablet Take 1 tablet (50 mcg total) by mouth daily. 06/30/15  Yes Arnoldo Morale, MD  lisinopril (PRINIVIL,ZESTRIL) 20 MG tablet Take 20 mg by mouth daily.   Yes Historical Provider, MD  metFORMIN (GLUCOPHAGE) 500 MG tablet Take 500 mg by mouth 3 (three) times daily with meals.    Yes Historical Provider, MD  nitroGLYCERIN (NITROSTAT) 0.4 MG SL tablet Place 1 tablet (0.4 mg total) under the tongue every  5 (five) minutes as needed for chest pain. 01/16/15  Yes Rogelia Mire, NP  pantoprazole (PROTONIX) 40 MG tablet Take 1 tablet (40 mg total) by mouth daily. 09/22/15 09/21/16 Yes Arnoldo Morale, MD  rivaroxaban (XARELTO) 20 MG TABS tablet Take 1 tablet (20 mg total) by mouth daily with supper. Patient taking differently: Take 20 mg by mouth at bedtime.  06/30/15  Yes Arnoldo Morale, MD  vitamin B-12 1000 MCG tablet Take 1 tablet (1,000 mcg total) by mouth daily. 11/10/15  Yes Brittainy Erie Noe, PA-C   Allergies  Allergen Reactions  . Amiodarone Nausea And Vomiting    FAMILY HISTORY:  Family History  Problem Relation Age of Onset  . Hypertension Mother   . Diabetes Mother   . Cancer Mother     breast, ovarian, colon  . Clotting disorder Mother   . Heart disease Mother   . Heart attack Mother   . Hypertension Father   . Heart disease Father   . Emphysema Sister     smoked   SOCIAL HISTORY:  reports that she quit smoking about 3 weeks ago. Her smoking use included E-cigarettes. She smoked 0.00 packs per day for 40 years. She has quit using smokeless tobacco. She reports that she does not drink alcohol or use illicit drugs.  SUBJECTIVE:   VITAL SIGNS: Temp:  [98.1 F (36.7 C)-98.3 F (36.8  C)] 98.1 F (36.7 C) (01/13 0404) Pulse Rate:  [54-79] 58 (01/13 0700) Resp:  [12-24] 12 (01/13 0700) BP: (99-170)/(51-120) 115/86 mmHg (01/13 0600) SpO2:  [85 %-100 %] 96 % (01/13 0700) FiO2 (%):  [40 %] 40 % (01/12 1349) Weight:  [101.9 kg (224 lb 10.4 oz)] 101.9 kg (224 lb 10.4 oz) (01/13 0455) HEMODYNAMICS: CVP:  [4 mmHg-11 mmHg] 5 mmHg VENTILATOR SETTINGS: Vent Mode:  [-] PSV;CPAP FiO2 (%):  [40 %] 40 % Set Rate:  [24 bmp] 24 bmp Vt Set:  [440 mL] 440 mL PEEP:  [5 cmH20] 5 cmH20 Pressure Support:  [5 cmH20] 5 cmH20 Plateau Pressure:  [21 cmH20] 21 cmH20 INTAKE / OUTPUT: Intake/Output      01/12 0701 - 01/13 0700 01/13 0701 - 01/14 0700   I.V. (mL/kg) 952.8 (9.4)    NG/GT 275    IV Piggyback 400    Total Intake(mL/kg) 1627.8 (16)    Urine (mL/kg/hr) 7950 (3.3)    Total Output 7950     Net -6322.2            PHYSICAL EXAMINATION: Gen: Resting comfortably in hospital bed. HEENT: EOMI PULM: Diffuse end expiratory wheezing CV: RRR. No murmur, rubs, or gallop.  GI: BS+, soft, nontender, nondistended Ext: trace edema, warm Neuro: A&Ox4, following commands  LABS:  CBC  Recent Labs Lab 11/25/15 0430 11/26/15 0400 11/27/15 0445  WBC 8.9 13.7* 14.5*  HGB 8.0* 8.2* 9.3*  HCT 25.8* 26.5* 30.1*  PLT 267 278 304   Coag's  Recent Labs Lab 11/25/15 1300 11/25/15 1940 11/26/15 0400  APTT 61* 58* 60*   BMET  Recent Labs Lab 11/25/15 1752 11/26/15 0400 11/27/15 0445  NA 140 140 141  K 3.7 3.1* 3.1*  CL 96* 98* 90*  CO2 32 33* 40*  BUN 68* 75* 57*  CREATININE 2.00* 1.82* 1.46*  GLUCOSE 204* 185* 169*   Electrolytes  Recent Labs Lab 11/25/15 1752 11/26/15 0400 11/27/15 0445  CALCIUM 8.4* 8.3* 8.9   Sepsis Markers No results for input(s): LATICACIDVEN, PROCALCITON, O2SATVEN in the last 168 hours. ABG  Recent Labs Lab 11/24/15 0251 11/24/15 1150 11/27/15 0344  PHART 7.400 7.291* 7.509*  PCO2ART 55.9* 79.6* 54.0*  PO2ART 92.0 543.0* 63.0*    Liver Enzymes  Recent Labs Lab 11/24/15 0530  AST 41  ALT 54  ALKPHOS 216*  BILITOT 0.6  ALBUMIN 3.0*   Cardiac Enzymes  Recent Labs Lab 11/24/15 1808 11/24/15 2300 11/25/15 0430  TROPONINI 0.28* 0.23* 0.22*   Glucose  Recent Labs Lab 11/26/15 0832 11/26/15 1203 11/26/15 1602 11/26/15 2005 11/26/15 2333 11/27/15 0404  GLUCAP 132* 155* 168* 138* 152* 162*    Imaging Dg Chest Port 1 View  11/26/2015  CLINICAL DATA:  CHF. EXAM: PORTABLE CHEST 1 VIEW COMPARISON:  11/25/2015. FINDINGS: Lines and tubes in stable position. Patch that endotracheal tube, NG tube, left IJ line in stable position. Heart size normal. Bibasilar atelectasis and/or infiltrates. Slight interim progression. No pleural effusion or pneumothorax. IMPRESSION: 1. Lines and tubes in stable position. 2. Low lung volumes with bibasilar atelectasis and/or infiltrates, progressed slightly from prior exam. Electronically Signed   By: [place]   On: 11/26/2015 07:21     CXR: Low lung volumes with vascular crowding and bibasilar atelectasis. Suspect small pleural effusions.   DISCUSSION: 55 yo wf with acute resp distress with a fib rvr, decompensated with increased WOB requiring intubation on 1/10. Now diuresing well, passing SBT.  ASSESSMENT / PLAN:  PULMONARY A: Acute resp failure, suspect due primarily to CHF in setting RVR, superimposed COPD Acute pulmonary edema due to CHF acute decompensated Asthma? with continued tobacco abuse, no obstruction on PFT, no emphysema on CT chest P:  Extubated yesterday  Continue prednisone Continue bronchodilators May need high flow O2  CARDIOVASCULAR A:  CHF, acute decompensated A fib with RVR HTN Poor compliance  P:  Amio per cards Diuresis per cardiology, monitor UOP and renal fxn, BP Add hydralazine prn for SBP > 150. CVP monitoring  RENAL A:  AKI (SCr improved from 1.82 yest to 1.46 today)  P:  Avoid nephrotoxins Hold  Lisinopril  Monitor BMET and UOP Replace electrolytes as needed   GASTROINTESTINAL A:  GI protection P:  PPI Senna Colace  HEMATOLOGIC A:  Anticoagulation for A fib P:  Heparin drip per Cards Xarelto started today   INFECTIOUS A:  Presumed bronchitis P:  Levaquin   ENDOCRINE A:  Poorly controlled DM P:  SSI  NEUROLOGIC A:  No acute issues P:  RASS goal: 0 Stop sedation  TODAY'S SUMMARY:   I have personally obtained a history, examined the patient, evaluated laboratory and imaging results, formulated the assessment and plan and placed orders. CRITICAL CARE: The patient is critically ill with multiple organ systems failure and requires high complexity decision making for assessment and support, frequent evaluation and titration of therapies, application of advanced monitoring technologies and extensive interpretation of multiple databases. Critical Care Time devoted to patient care services described in this note is  minutes.

## 2015-11-27 NOTE — Evaluation (Signed)
Physical Therapy Evaluation Patient Details Name: [name] MRN: [id number] DOB: 11-23-1960 Today's Date: 11/27/2015   History of Present Illness  Patient is a 55 y/o female with hx of neuropathies, polysubstance, HF, COPD and DM presents with worsening sob in setting of increased sputum production and cough likely due to combined decompensated systolic heart failure and COPD exacerbation.  Clinical Impression  Patient presents with decreased strength, endurance, balance and respiratory status impacting safe mobility. Sp02 decreases to 85% on 2L/min 02. Cues for pursed lip breathing. Pt lives in [place] with sister who cannot provide any physical assist. Would benefit from use of RW and w/c at home for safe mobility. Will follow acutely to maximize independence and mobility prior to return home.    Follow Up Recommendations Home health PT;Supervision/Assistance - 24 hour    Equipment Recommendations  Rolling walker with 5" wheels;Wheelchair (measurements PT);Wheelchair cushion (measurements PT)    Recommendations for Other Services OT consult     Precautions / Restrictions Precautions Precautions: Fall Precaution Comments: monitor 02 Restrictions Weight Bearing Restrictions: No      Mobility  Bed Mobility               General bed mobility comments: Sitting in recliner upon PT arrival.   Transfers Overall transfer level: Needs assistance Equipment used: Rolling walker (2 wheeled) Transfers: Sit to/from Stand Sit to Stand: Min assist         General transfer comment: Min A to boost from chair with cues for hand placement and use of momentum. Stood Gaffer. Knee instability noted bilaterally.  Ambulation/Gait Ambulation/Gait assistance: Min assist Ambulation Distance (Feet): 125 Feet Assistive device: Rolling walker (2 wheeled) Gait Pattern/deviations: Step-through pattern;Decreased stride length;Drifts right/left   Gait velocity interpretation: Below normal speed  for age/gender General Gait Details: Slow, unsteady gait with Min A for balance. Sp02 dropped to 85% on 2L/min 02.   Stairs            Wheelchair Mobility    Modified Rankin (Stroke Patients Only)       Balance Overall balance assessment: Needs assistance Sitting-balance support: Feet supported;No upper extremity supported Sitting balance-Leahy Scale: Good     Standing balance support: During functional activity Standing balance-Leahy Scale: Poor Standing balance comment: Reliant on RW for support. Instability in bil knees- no buckling.                             Pertinent Vitals/Pain Pain Assessment: No/denies pain    Home Living Family/patient expects to be discharged to:: Private residence Living Arrangements: Other relatives Available Help at Discharge: Family;Available 24 hours/day (sister lives with her but not able to do any lifting or physically assist due to being on disability.) Type of Home: Other(Comment) (motel room) Home Access: Level entry     Home Layout: One level Home Equipment: Clinical cytogeneticist - 2 wheels;Cane - single point (Reports shower chair is broken- screws are loose) Additional Comments: Reports her RW is out of town in [place].    Prior Function Level of Independence: Needs assistance   Gait / Transfers Assistance Needed: Reports independence with ambulation PTA.  ADL's / Homemaking Assistance Needed: Sister does cooking/cleaning.  Pt has aide come in 1 hour/day for 5 days to assist with ADLs- bathing.        Hand Dominance   Dominant Hand: Left    Extremity/Trunk Assessment   Upper Extremity Assessment: Defer to OT evaluation  Lower Extremity Assessment: RLE deficits/detail;LLE deficits/detail         Communication   Communication: No difficulties  Cognition Arousal/Alertness: Awake/alert Behavior During Therapy: WFL for tasks assessed/performed Overall Cognitive Status: Impaired/Different  from baseline Area of Impairment: Awareness;Safety/judgement         Safety/Judgement: Decreased awareness of safety;Decreased awareness of deficits          General Comments      Exercises        Assessment/Plan    PT Assessment Patient needs continued PT services  PT Diagnosis Difficulty walking;Generalized weakness   PT Problem List Decreased strength;Cardiopulmonary status limiting activity;Decreased activity tolerance;Impaired sensation;Decreased balance;Decreased mobility;Decreased safety awareness  PT Treatment Interventions Balance training;Gait training;Functional mobility training;Therapeutic activities;Therapeutic exercise;Patient/family education   PT Goals (Current goals can be found in the Care Plan section) Acute Rehab PT Goals Patient Stated Goal: to get stronger PT Goal Formulation: With patient Time For Goal Achievement: 12/11/15 Potential to Achieve Goals: Good    Frequency Min 3X/week   Barriers to discharge Decreased caregiver support Sister not able to assist physically.    Co-evaluation               End of Session Equipment Utilized During Treatment: Gait belt;Oxygen Activity Tolerance: Treatment limited secondary to medical complications (Comment) (drop in sp02 to 85% on 2L) Patient left: in chair;with call bell/phone within reach Nurse Communication: Mobility status         Time: [id number] PT Time Calculation (min) (ACUTE ONLY): 23 min   Charges:   PT Evaluation $PT Eval Moderate Complexity: 1 Procedure PT Treatments $Gait Training: 8-22 mins   PT G Codes:        Zafiro Routson A Idaliz Tinkle 11/27/2015, 11:43 AM Wray Kearns, PT, DPT [phone]

## 2015-11-27 NOTE — Care Management Note (Signed)
Case Management Note  Patient Details  Name: [name] MRN: [id number] Date of Birth: 12-29-1960  Subjective/Objective:  Patient lives with sister in a motel at this time.  Sister has had bilateral shoulder surgery so cannot do heavy lifting, but does do the cooking, motel has small kitchen.  Has a walker but it is in storage at this time, not sure if she can get.  [place] RN would be a good idea for continued education and monitoring.  Hoping to be able to move into own place shortly.                     Action/Plan:   Expected Discharge Date:  11/25/15               Expected Discharge Plan:  [place]  In-House Referral:     Discharge planning Services  CM Consult  Post Acute Care Choice:    Choice offered to:     DME Arranged:    DME Agency:     HH Arranged:    HH Agency:     Status of Service:  In process, will continue to follow  Medicare Important Message Given:  Yes Date Medicare IM Given:    Medicare IM give by:    Date Additional Medicare IM Given:    Additional Medicare Important Message give by:     If discussed at [place] of Stay Meetings, dates discussed:    Additional Comments:  Vergie Living, RN 11/27/2015, 11:41 AM

## 2015-11-27 NOTE — Care Management Important Message (Signed)
Important Message  Patient Details  Name: [name] MRN: [id number] Date of Birth: 20-May-1961   Medicare Important Message Given:  Yes    [name] [name] 11/27/2015, 11:17 AM

## 2015-11-27 NOTE — Progress Notes (Signed)
Patient ID: [name], female   DOB: [birth year], 55 y.o.   MRN: [id number]    Advanced Heart Failure Rounding Note   Subjective:    She was intubated 1/10 for hypoxemic respiratory failure and increased workup of breathing.  She converted back to NSR on amiodarone gtt and remains on amiodarone gtt.    Yesterday renal dose dopamine added and she diuresed with IV lasix + metolazone. Brisk diuresis noted, weight down considerably. CVP 4.  Extubated to 6 liters [place]. Creatinine coming down 1.8 > 1.46   Overall feeling better. Mild dyspnea .   Echo (11/24/15) showed EF 45% with inferolateral hypokinesis and mildly decreased RV systolic function.  This is similar to prior echo from 12/15.    Most recent [place] 10/31/2014: Large caliber vessel with 30% mid stenosis. The first obtuse marginal branch was a small caliber vessel (1.5 mm) with diffuse 80% stenosis in the proximal and mid segment. The second obtuse marginal branch is small in caliber with 40% mid stenosis. The left posterolateral branch has mild diffuse plaque.    Objective:   Weight Range:  Vital Signs:   Temp:  [98.1 F (36.7 C)-98.3 F (36.8 C)] 98.1 F (36.7 C) (01/13 0404) Pulse Rate:  [54-79] 58 (01/13 0700) Resp:  [12-24] 12 (01/13 0700) BP: (99-170)/(51-120) 115/86 mmHg (01/13 0600) SpO2:  [85 %-100 %] 96 % (01/13 0700) FiO2 (%):  [40 %] 40 % (01/12 1349) Weight:  [224 lb 10.4 oz (101.9 kg)] 224 lb 10.4 oz (101.9 kg) (01/13 0455) Last BM Date: 11/23/15  Weight change: Filed Weights   11/25/15 0427 11/26/15 0500 11/27/15 0455  Weight: 241 lb 2.9 oz (109.4 kg) 242 lb 1 oz (109.8 kg) 224 lb 10.4 oz (101.9 kg)    Intake/Output:   Intake/Output Summary (Last 24 hours) at 11/27/15 [id number] Last data filed at 11/27/15 0700  Gross per 24 hour  Intake 1627.8 ml  Output   7950 ml  Net -6322.2 ml     Physical Exam: CVP 4 General:  NAD.    HEENT: normal Neck: Thick. JVP hard to assess but does not appear elevated.     Cor: PMI nondisplaced. Regular S1S2. No rubs, gallops or murmurs. Lungs: Still with rhonchi. 6 liters .   Abdomen: soft, nontender, nondistended. No hepatosplenomegaly. No bruits or masses. Good bowel sounds. Extremities: no cyanosis, clubbing, rash. 1+ ankle edema. R and LLE anterior aspect erythema.  Neuro: Alert and Oriented x3.  GU: Foley  Telemetry:  NSR 60s  Labs: Basic Metabolic Panel:  Recent Labs Lab 11/24/15 0530 11/25/15 0430 11/25/15 1752 11/26/15 0400 11/27/15 0445  NA 137 138 140 140 141  K 3.9 3.8 3.7 3.1* 3.1*  CL 96* 96* 96* 98* 90*  CO2 31 32 32 33* 40*  GLUCOSE 342* 185* 204* 185* 169*  BUN 33* 53* 68* 75* 57*  CREATININE 1.50* 1.93* 2.00* 1.82* 1.46*  CALCIUM 7.9* 8.3* 8.4* 8.3* 8.9    Liver Function Tests:  Recent Labs Lab 11/24/15 0530  AST 41  ALT 54  ALKPHOS 216*  BILITOT 0.6  PROT 6.8  ALBUMIN 3.0*   No results for input(s): LIPASE, AMYLASE in the last 168 hours. No results for input(s): AMMONIA in the last 168 hours.  CBC:  Recent Labs Lab 11/23/15 2234 11/24/15 0530 11/25/15 0430 11/26/15 0400 11/27/15 0445  WBC 11.0* 10.2 8.9 13.7* 14.5*  NEUTROABS  --  8.4*  --   --  9.8*  HGB 10.8* 9.1* 8.0*  8.2* 9.3*  HCT 34.1* 29.1* 25.8* 26.5* 30.1*  MCV 92.7 92.1 91.5 91.4 91.8  PLT 395 290 267 278 304    Cardiac Enzymes:  Recent Labs Lab 11/24/15 1808 11/24/15 2300 11/25/15 0430  TROPONINI 0.28* 0.23* 0.22*    BNP: BNP (last 3 results)  Recent Labs  06/23/15 0005 10/15/15 1355 11/23/15 2234  BNP 539.4* 246.5* 523.5*    ProBNP (last 3 results) No results for input(s): PROBNP in the last 8760 hours.    Other results:  Imaging: Dg Chest Port 1 View  11/26/2015  CLINICAL DATA:  CHF. EXAM: PORTABLE CHEST 1 VIEW COMPARISON:  11/25/2015. FINDINGS: Lines and tubes in stable position. Patch that endotracheal tube, NG tube, left IJ line in stable position. Heart size normal. Bibasilar atelectasis and/or  infiltrates. Slight interim progression. No pleural effusion or pneumothorax. IMPRESSION: 1. Lines and tubes in stable position. 2. Low lung volumes with bibasilar atelectasis and/or infiltrates, progressed slightly from prior exam. Electronically Signed   By: Cortland West   On: 11/26/2015 07:21     Medications:     Scheduled Medications: . antiseptic oral rinse  7 mL Mouth Rinse q12n4p  . atorvastatin  40 mg Oral Daily  . atropine  2 drop Sublingual QID  . chlorhexidine  15 mL Mouth Rinse BID  . cholecalciferol  2,000 Units Oral Daily  . folic acid  1 mg Oral Daily  . gabapentin  300 mg Oral TID AC & HS  . insulin aspart  0-20 Units Subcutaneous 6 times per day  . insulin aspart  10 Units Subcutaneous 6 times per day  . insulin glargine  60 Units Subcutaneous QHS  . levofloxacin (LEVAQUIN) IV  750 mg Intravenous Q48H  . levothyroxine  50 mcg Oral QAC breakfast  . pantoprazole sodium  40 mg Per Tube Daily  . potassium chloride  10 mEq Intravenous Q1 Hr x 4  . predniSONE  40 mg Per Tube Q breakfast  . senna-docusate  1 tablet Per Tube BID  . tiotropium  18 mcg Inhalation Daily    Infusions: . amiodarone 30 mg/hr (11/27/15 0528)  . DOPamine 2.5 mcg/kg/min (11/27/15 0700)  . furosemide (LASIX) infusion 10 mg/hr (11/26/15 0905)  . heparin 1,300 Units/hr (11/27/15 [id number])    PRN Medications: acetaminophen, acetaminophen-codeine, albuterol, ALPRAZolam, fentaNYL (SUBLIMAZE) injection, hydrALAZINE, ondansetron (ZOFRAN) IV   Assessment/Plan :   1. Acute hypoxemic respiratory failure: Suspect combination of CHF and COPD exacerbation, wheezing initially and moving air poorly => intubated 1/10. Extubated. 1/12 to 6 liters.  - Treating  nebs, steroids, and antibiotics.  2. Atrial fibrillation with RVR: History of PAF, on Xarelto at home.  Says she has not missed any doses.  HR in 150s initially, started amiodarone gtt with eventual conversion to NSR, remains in NSR today.  - Stop IV  amio and transition to amio 400 mg twice a day.  - Stop IV heparin and start Xarelto 20 mg daily.  3. Acute on chronic systolic CHF: [id number] on echo with inferolateral hypokinesis and mildly decreased RV systolic function.  She had a similar echo pattern in 12/15 and cardiac cath in 12/15 with nonobstructive and relatively mild CAD.  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  She briefly required norepinephrine, may have been due to sedation (dropped BP after intubation), now stable BP off pressors. She initially diuresed poorly, then low dose dopamine added.  Brisk diuresis noted with dopamine.  Renal function improved. Negative 6.3  liters. CVP down 4.  Weight down 18 pounds.   - Stop dopamine and lasix drip. - Start lasix 80 mg po twice a day. Prior to admit she was on lasix 60 mg twice a day.  - Supplement K. Give K runs.  Start 40 meq K daily.  - Off Coreg with wheezing and decompensated CHF => can start low dose bisoprolol (beta-1 selective).  - Hold lisinopril with AKI.  4. COPD: Suspect exacerbation.  Nebs, steroids, abx per CCM.  Extubated 1/12 to 6 liters. Still with rhonchi on exam.  5. PAD: Extensive PAD history.  She has been on ASA, Plavix and Xarelto at home. Last peripheral PCI was in 2014.  She is off ASA and Plavix as there is likely no benefit beyond her anticoagulation alone and will make her more prone to pathologic bleeding.  6. AKI: Creatinine  Coming down from 1.82 > 1.46   7. Anemia: Chronic.   Hgb 9.3. As above, pff Plavix and ASA. Stop heparin drip. Start xarelto 20 mg daily.  8. Cirrhosis: ETOH-related per report.  9. CAD: Nonobstructive on 12/15 cath.  TnI mildly elevated this admission, max 0.28 with minimal trend.  Suspect most likely demand ischemia with hypoxemia and volume overload.  Echo wall motion pattern this admission is similar to the prior echo in 12/15.  Would avoid cath for now, especially with AKI.  10. Deconditioning: Consult PT.  OOB  Transfer to stepdown.   [name] Clegg NP-C  11/27/2015 7:02 AM   Patient seen with NP and agree with the above note.  She diuresed very well yesterday on dopamine gtt + Lasix gtt + metolazone.  Weight down considerably and CVP 4 this morning.  Creatinine lower.   - Can stop Lasix gtt, transition to po as above.  - Stop dopamine.  - Stop heparin gtt, transition back to Xarelto.  - Stop amiodarone gtt, transition to po as above.  - Ok to transfer out of unit.   Loralie Champagne 11/27/2015 7:41 AM

## 2015-11-27 NOTE — Progress Notes (Signed)
ANTICOAGULATION CONSULT NOTE - Follow Up Consult  Pharmacy Consult for Heparin Indication: atrial fibrillation  Allergies  Allergen Reactions  . Amiodarone Nausea And Vomiting    Patient Measurements: Height: 5\' 4"  (162.6 cm) Weight: 224 lb 10.4 oz (101.9 kg) IBW/kg (Calculated) : 54.7 Heparin Dosing Weight: 79kg  Vital Signs: Temp: 98.1 F (36.7 C) (01/13 0404) Temp Source: Oral (01/13 0404) BP: 115/86 mmHg (01/13 0600) Pulse Rate: 58 (01/13 0700)  Labs:  Recent Labs  11/24/15 1808 11/24/15 2300  11/25/15 0430 11/25/15 1300 11/25/15 1752 11/25/15 1940 11/26/15 0400 11/26/15 0700 11/27/15 0445  HGB  --   --   < > 8.0*  --   --   --  8.2*  --  9.3*  HCT  --   --   --  25.8*  --   --   --  26.5*  --  30.1*  PLT  --   --   --  267  --   --   --  278  --  304  APTT  --   --   --  67* 61*  --  58* 60*  --   --   HEPARINUNFRC  --   --   --  0.68  --   --  0.55  --  0.51 0.57  CREATININE  --   --   < > 1.93*  --  2.00*  --  1.82*  --  1.46*  TROPONINI 0.28* 0.23*  --  0.22*  --   --   --   --   --   --   < > = values in this interval not displayed.  Estimated Creatinine Clearance: 51.8 mL/min (by C-G formula based on Cr of 1.46).   Medications:  Heparin @ 1300 units/hr  Assessment: 54yof on xarelto for afib, decompensated 1/10 and was intubated. Xarelto held and switched to IV heparin (last xarelto dose 1/9 PM). Heparin level is therapeutic at 0.57 today, however, patient is extubated so will switch back to xarelto. Renal function improving.  Goal of Therapy:  Heparin level 0.3-0.7 units/ml Monitor platelets by anticoagulation protocol: Yes   Plan:  1) Continue heparin at 1300 units/hr until 1600 today then d/c 2) Resume xarelto 20mg  daily with supper  Nena Jordan, PharmD., BCPS 11/27/2015, 7:25 AM

## 2015-11-27 NOTE — Progress Notes (Signed)
CRITICAL VALUE ALERT  Critical value received:  Blood glucose-401  Date of notification: 11/27/2015  Time of notification: 2015  Critical value read back:yes  Nurse who received alert: Rebecka Apley RN   MD notified (1st page):  Dr. Susy Manor  Time of first page:  2020  MD notified (2nd page):  Time of second page:  Responding MD: Dr. Susy Manor  Time MD responded:  2030  MD notified of patient's glucose level. New orders obtained. Will continue to monitor.

## 2015-11-27 NOTE — Care Management (Addendum)
  Case Management Note Initial Note Started By: Luz Lex RN Case Manager Patient Details  Name: FABIAN ALMENDINGER MRN: [id number] Date of Birth: 09/15/1961  Subjective/Objective: Patient lives with sister in a motel at this time. Sister has had bilateral shoulder surgery so cannot do heavy lifting, but does do the cooking, motel has small kitchen. Has a walker but it is in storage at this time, not sure if she can get. [facility] RN would be a good idea for continued education and monitoring. Hoping to be able to move into own place shortly.    Action/Plan:   Expected Discharge Date: 11/25/15   Expected Discharge Plan: [place]  In-House Referral:    Discharge planning Services CM Consult  Post Acute Care Choice:   Choice offered to:    DME Arranged: [facility] Walker and Oxygen  DME Agency:  [facility]  HH Arranged: Registered Nurse, Physical Therapy and Social Worker [facility] Agency:  [facility].   Medicare Important Message Given: Yes Date Medicare IM Given:   Medicare IM give by:   Date Additional Medicare IM Given:   Additional Medicare Important Message give by:    If discussed at [place] of Stay Meetings, dates discussed:   Additional Comments: 1211 12-01-15 Jacqlyn Krauss, RN,BSN [phone] CM did speak with pt and she lives in a [place] [address] With her sister.  Pt has Medicaid Transportation and has an Aide via Medicaid that is in the home 1 hr per day 5 days week. Referral made to [facility] for [facility] RN. PT, and Social Work for housing resources. SOC to begin within 24-48 hrs.  DME RW and 02 to be delivered to room for d/c. No further needs from CM at this time.     [id number] 11-27-15 Jacqlyn Krauss, RN, BSN [phone] CM just notified that pt is a transfer from 2 M and will need assistance at home. CM was approached by Cardiac Rehab RN  and Staff RN. CM looked at notes- pt would be a good [facility] Failure Clinic Pt. CM did reach out to HF Navigator to see if they can f/u. CSW in the HF Clinic could assist with the patient in addition to [facility].

## 2015-11-28 LAB — CBC
HEMATOCRIT: 28.5 % — AB (ref 36.0–46.0)
Hemoglobin: 9.1 g/dL — ABNORMAL LOW (ref 12.0–15.0)
MCH: 29.3 pg (ref 26.0–34.0)
MCHC: 31.9 g/dL (ref 30.0–36.0)
MCV: 91.6 fL (ref 78.0–100.0)
PLATELETS: 289 10*3/uL (ref 150–400)
RBC: 3.11 MIL/uL — AB (ref 3.87–5.11)
RDW: 14.5 % (ref 11.5–15.5)
WBC: 13.6 10*3/uL — ABNORMAL HIGH (ref 4.0–10.5)

## 2015-11-28 LAB — BASIC METABOLIC PANEL
Anion gap: 9 (ref 5–15)
BUN: 72 mg/dL — ABNORMAL HIGH (ref 6–20)
CALCIUM: 8.8 mg/dL — AB (ref 8.9–10.3)
CHLORIDE: 93 mmol/L — AB (ref 101–111)
CO2: 36 mmol/L — ABNORMAL HIGH (ref 22–32)
Creatinine, Ser: 1.79 mg/dL — ABNORMAL HIGH (ref 0.44–1.00)
GFR calc non Af Amer: 31 mL/min — ABNORMAL LOW (ref >60)
GFR, EST AFRICAN AMERICAN: 36 mL/min — AB (ref >60)
Glucose, Bld: 135 mg/dL — ABNORMAL HIGH (ref 65–99)
Potassium: 4.4 mmol/L (ref 3.5–5.1)
Sodium: 138 mmol/L (ref 135–145)

## 2015-11-28 LAB — CARBOXYHEMOGLOBIN
Carboxyhemoglobin: 1.4 % (ref 0.5–1.5)
Methemoglobin: 0.8 % (ref 0.0–1.5)
O2 Saturation: 47.3 %
Total hemoglobin: 9.1 g/dL — ABNORMAL LOW (ref 12.0–16.0)

## 2015-11-28 LAB — GLUCOSE, CAPILLARY
GLUCOSE-CAPILLARY: 137 mg/dL — AB (ref 65–99)
GLUCOSE-CAPILLARY: 138 mg/dL — AB (ref 65–99)
GLUCOSE-CAPILLARY: 244 mg/dL — AB (ref 65–99)
GLUCOSE-CAPILLARY: 365 mg/dL — AB (ref 65–99)
GLUCOSE-CAPILLARY: 379 mg/dL — AB (ref 65–99)

## 2015-11-28 MED ORDER — BENZONATATE 100 MG PO CAPS
100.0000 mg | ORAL_CAPSULE | Freq: Three times a day (TID) | ORAL | Status: DC | PRN
  Administered 2015-11-28 – 2015-12-02 (×4): 100 mg via ORAL
  Filled 2015-11-28 (×4): qty 1

## 2015-11-28 MED ORDER — PHENOL 1.4 % MT LIQD
1.0000 | OROMUCOSAL | Status: DC | PRN
  Administered 2015-11-28: 1 via OROMUCOSAL
  Filled 2015-11-28 (×2): qty 177

## 2015-11-28 MED ORDER — MENTHOL 3 MG MT LOZG
1.0000 | LOZENGE | OROMUCOSAL | Status: DC | PRN
  Administered 2015-11-28 – 2015-12-02 (×4): 3 mg via ORAL
  Filled 2015-11-28 (×5): qty 9

## 2015-11-28 MED ORDER — TORSEMIDE 20 MG PO TABS
40.0000 mg | ORAL_TABLET | Freq: Two times a day (BID) | ORAL | Status: DC
  Administered 2015-11-28 – 2015-12-01 (×6): 40 mg via ORAL
  Filled 2015-11-28 (×6): qty 2

## 2015-11-28 NOTE — Progress Notes (Addendum)
Patient ID: [name], female   DOB: Oct 15, 1961, 55 y.o.   MRN: [id number]    Advanced Heart Failure Rounding Note   Subjective:    She was intubated 1/10 for hypoxemic respiratory failure and increased workup of breathing.  She converted back to NSR on amiodarone gtt and remains on amiodarone gtt.    1/12 renal dose dopamine added and she diuresed with IV lasix + metolazone.Extubated to 6 liters [place]. Creatinine coming down 1.8 > 1.46   1/13 dopamine stopped. Transitioned to po lasix. Weight up 3 pounds. Creatinine 1.46->1.79  Breathing better. Down to 2L [place]. Drinking coffee and water on tray.   Studies:  Echo (11/24/15) showed EF 45% with inferolateral hypokinesis and mildly decreased RV systolic function.  This is similar to prior echo from 12/15.    Most recent [place] 10/31/2014: Large caliber vessel with 30% mid stenosis. The first obtuse marginal branch was a small caliber vessel (1.5 mm) with diffuse 80% stenosis in the proximal and mid segment. The second obtuse marginal branch is small in caliber with 40% mid stenosis. The left posterolateral branch has mild diffuse plaque.    Objective:   Weight Range:  Vital Signs:   Temp:  [97.5 F (36.4 C)-98.5 F (36.9 C)] 97.5 F (36.4 C) (01/14 0812) Pulse Rate:  [56-82] 67 (01/14 0812) Resp:  [13-21] 13 (01/14 0812) BP: (92-135)/(45-75) 120/45 mmHg (01/14 0812) SpO2:  [94 %-100 %] 98 % (01/14 0925) Weight:  [103.012 kg (227 lb 1.6 oz)] 103.012 kg (227 lb 1.6 oz) (01/14 0331) Last BM Date: 11/23/15  Weight change: Filed Weights   11/26/15 0500 11/27/15 0455 11/28/15 0331  Weight: 109.8 kg (242 lb 1 oz) 101.9 kg (224 lb 10.4 oz) 103.012 kg (227 lb 1.6 oz)    Intake/Output:   Intake/Output Summary (Last 24 hours) at 11/28/15 1028 Last data filed at 11/27/15 1758  Gross per 24 hour  Intake    525 ml  Output    300 ml  Net    225 ml     Physical Exam: General:  NAD.    HEENT: normal Neck: Thick. JVP hard to assess  but does not appear elevated.   Cor: PMI nondisplaced. Regular S1S2. No rubs, gallops or murmurs. Lungs: Poor air movement throughout. No wheezing Abdomen:  Obese soft, nontender, nondistended. No hepatosplenomegaly. No bruits or masses. Good bowel sounds. Extremities: no cyanosis, clubbing, rash. Tr ankle edema.  Neuro: Alert and Oriented x3.    Telemetry:  NSR 60s  Labs: Basic Metabolic Panel:  Recent Labs Lab 11/25/15 0430 11/25/15 1752 11/26/15 0400 11/27/15 0445 11/28/15 0530  NA 138 140 140 141 138  K 3.8 3.7 3.1* 3.1* 4.4  CL 96* 96* 98* 90* 93*  CO2 32 32 33* 40* 36*  GLUCOSE 185* 204* 185* 169* 135*  BUN 53* 68* 75* 57* 72*  CREATININE 1.93* 2.00* 1.82* 1.46* 1.79*  CALCIUM 8.3* 8.4* 8.3* 8.9 8.8*    Liver Function Tests:  Recent Labs Lab 11/24/15 0530  AST 41  ALT 54  ALKPHOS 216*  BILITOT 0.6  PROT 6.8  ALBUMIN 3.0*   No results for input(s): LIPASE, AMYLASE in the last 168 hours. No results for input(s): AMMONIA in the last 168 hours.  CBC:  Recent Labs Lab 11/24/15 0530 11/25/15 0430 11/26/15 0400 11/27/15 0445 11/28/15 0530  WBC 10.2 8.9 13.7* 14.5* 13.6*  NEUTROABS 8.4*  --   --  9.8*  --   HGB 9.1* 8.0* 8.2*  9.3* 9.1*  HCT 29.1* 25.8* 26.5* 30.1* 28.5*  MCV 92.1 91.5 91.4 91.8 91.6  PLT 290 267 278 304 289    Cardiac Enzymes:  Recent Labs Lab 11/24/15 1808 11/24/15 2300 11/25/15 0430  TROPONINI 0.28* 0.23* 0.22*    BNP: BNP (last 3 results)  Recent Labs  06/23/15 0005 10/15/15 1355 11/23/15 2234  BNP 539.4* 246.5* 523.5*    ProBNP (last 3 results) No results for input(s): PROBNP in the last 8760 hours.    Other results:  Imaging: Dg Chest Port 1 View  11/27/2015  CLINICAL DATA:  Acute respiratory failure with hypoxemia. EXAM: PORTABLE CHEST 1 VIEW COMPARISON:  11/26/2015 FINDINGS: The endotracheal tube and NG tubes have been removed. The left IJ catheter is stable. Low lung volumes with vascular crowding and  bibasilar atelectasis. No pulmonary edema. IMPRESSION: Interval removal of ET and NG tubes. Low lung volumes with vascular crowding and bibasilar atelectasis. Suspect small bilateral pleural effusions. Electronically Signed   By: Marijo Sanes M.D.   On: 11/27/2015 07:57     Medications:     Scheduled Medications: . amiodarone  400 mg Oral BID  . antiseptic oral rinse  7 mL Mouth Rinse q12n4p  . atorvastatin  40 mg Oral Daily  . bisoprolol  2.5 mg Oral Daily  . budesonide-formoterol  2 puff Inhalation BID  . chlorhexidine  15 mL Mouth Rinse BID  . cholecalciferol  2,000 Units Oral Daily  . folic acid  1 mg Oral Daily  . furosemide  80 mg Oral BID  . gabapentin  300 mg Oral TID AC & HS  . insulin aspart  0-20 Units Subcutaneous TID WC & HS  . insulin aspart  10 Units Subcutaneous TID WC  . insulin glargine  70 Units Subcutaneous QHS  . levofloxacin  500 mg Oral Daily  . levothyroxine  50 mcg Oral QAC breakfast  . pantoprazole  40 mg Oral Daily  . potassium chloride  40 mEq Oral Daily  . [START ON 11/29/2015] predniSONE  20 mg Oral Q breakfast   Followed by  . [START ON 11/30/2015] predniSONE  10 mg Oral Q breakfast   Followed by  . [START ON 12/02/2015] predniSONE  5 mg Oral Q breakfast  . rivaroxaban  20 mg Oral Q supper  . senna-docusate  1 tablet Per Tube BID  . tiotropium  18 mcg Inhalation Daily    Infusions:    PRN Medications: acetaminophen, acetaminophen-codeine, albuterol, ALPRAZolam, fentaNYL (SUBLIMAZE) injection, hydrALAZINE, ondansetron (ZOFRAN) IV   Assessment/Plan :   1. Acute hypoxemic respiratory failure: Suspect combination of CHF and COPD exacerbation. Extubated. 1/12 to 6 liters.  - Improving with diuresis nebs, steroids, and antibiotics.  - Has severe COPD on exam. Will need PFTs after d/c, 2. Atrial fibrillation with RVR: History of PAF, on Xarelto at home. HR in 150s initially, started amiodarone gtt with eventual conversion to NSR, remains in NSR  today.  - On po amio and Xarelto 3. Acute on chronic systolic CHF: [id number] on echo with inferolateral hypokinesis and mildly decreased RV systolic function.  She had a similar echo pattern in 12/15 and cardiac cath in 12/15 with nonobstructive and relatively mild CAD.  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  She briefly required norepinephrine, may have been due to sedation (dropped BP after intubation), now stable BP off pressors. She initially diuresed poorly, then low dose dopamine added.  Brisk diuresis noted with dopamine.  Renal function  improved. Negative 6.3 liters. Weight down 18 pounds.   - Off lasix drip and dopamine x 24 hours. Weight trending back up  - Recheck co-ox and CVP (currently disconnected). Switch lasix to torsemide 40 bid - On low dose bisoprolol (beta-1 selective).  - Hold lisinopril with AKI.  4. COPD: Suspect exacerbation.  Nebs, steroids, abx per CCM.  Extubated 1/12 to 6 liters.   5. PAD: Extensive PAD history.  She has been on ASA, Plavix and Xarelto at home. Last peripheral PCI was in 2014.  She is off ASA and Plavix as there is likely no benefit beyond her anticoagulation alone and will make her more prone to pathologic bleeding.  6. AKI: Creatinine labile 1.82 > 1.46  > 1.79. Recheck co-ox and CVP. Adjust diuretics accordingly. Watch renal function closely.  7. Anemia: Chronic.   Hgb 9.3. As above, pff Plavix and ASA. On xarelto 20 mg daily.  8. Cirrhosis: ETOH-related per report.  9. CAD: Nonobstructive on 12/15 cath.  TnI mildly elevated this admission, max 0.28 with minimal trend.  Suspect most likely demand ischemia with hypoxemia and volume overload.  Echo wall motion pattern this admission is similar to the prior echo in 12/15.  Would avoid cath for now, especially with AKI.  10. Deconditioning: Consult PT. Bethann Berkshire MD  11/28/2015 10:28 AM   Addendum: CVP 12.  Bensimhon, Daniel,MD 12:06 PM

## 2015-11-28 NOTE — Progress Notes (Signed)
MDI not given, pt is sleeping at this time. o2 saturation is stable at this time 99% on 2l o2.

## 2015-11-29 DIAGNOSIS — N184 Chronic kidney disease, stage 4 (severe): Secondary | ICD-10-CM

## 2015-11-29 LAB — BASIC METABOLIC PANEL
ANION GAP: 8 (ref 5–15)
BUN: 76 mg/dL — ABNORMAL HIGH (ref 6–20)
CO2: 35 mmol/L — AB (ref 22–32)
Calcium: 9 mg/dL (ref 8.9–10.3)
Chloride: 96 mmol/L — ABNORMAL LOW (ref 101–111)
Creatinine, Ser: 1.77 mg/dL — ABNORMAL HIGH (ref 0.44–1.00)
GFR calc Af Amer: 37 mL/min — ABNORMAL LOW (ref >60)
GFR calc non Af Amer: 32 mL/min — ABNORMAL LOW (ref >60)
GLUCOSE: 163 mg/dL — AB (ref 65–99)
POTASSIUM: 4.3 mmol/L (ref 3.5–5.1)
Sodium: 139 mmol/L (ref 135–145)

## 2015-11-29 LAB — CBC
HEMATOCRIT: 26.2 % — AB (ref 36.0–46.0)
Hemoglobin: 8.5 g/dL — ABNORMAL LOW (ref 12.0–15.0)
MCH: 29.4 pg (ref 26.0–34.0)
MCHC: 32.4 g/dL (ref 30.0–36.0)
MCV: 90.7 fL (ref 78.0–100.0)
Platelets: 330 10*3/uL (ref 150–400)
RBC: 2.89 MIL/uL — AB (ref 3.87–5.11)
RDW: 14.1 % (ref 11.5–15.5)
WBC: 16.4 10*3/uL — AB (ref 4.0–10.5)

## 2015-11-29 LAB — GLUCOSE, CAPILLARY
GLUCOSE-CAPILLARY: 151 mg/dL — AB (ref 65–99)
GLUCOSE-CAPILLARY: 154 mg/dL — AB (ref 65–99)
Glucose-Capillary: 182 mg/dL — ABNORMAL HIGH (ref 65–99)
Glucose-Capillary: 261 mg/dL — ABNORMAL HIGH (ref 65–99)
Glucose-Capillary: 318 mg/dL — ABNORMAL HIGH (ref 65–99)
Glucose-Capillary: 371 mg/dL — ABNORMAL HIGH (ref 65–99)

## 2015-11-29 LAB — CARBOXYHEMOGLOBIN
CARBOXYHEMOGLOBIN: 1.4 % (ref 0.5–1.5)
METHEMOGLOBIN: 0.6 % (ref 0.0–1.5)
O2 Saturation: 52.1 %
Total hemoglobin: 10.4 g/dL — ABNORMAL LOW (ref 12.0–16.0)

## 2015-11-29 MED ORDER — LOSARTAN POTASSIUM 25 MG PO TABS
25.0000 mg | ORAL_TABLET | Freq: Every day | ORAL | Status: DC
  Administered 2015-11-29 – 2015-12-01 (×3): 25 mg via ORAL
  Filled 2015-11-29 (×3): qty 1

## 2015-11-29 NOTE — Progress Notes (Signed)
Patient ID: [name], female   DOB: 1960/12/28, 55 y.o.   MRN: [id number]    Advanced Heart Failure Rounding Note   Subjective:    She was intubated 1/10 for hypoxemic respiratory failure and increased workup of breathing.  She converted back to NSR on amiodarone gtt and remains on amiodarone gtt.    1/12 renal dose dopamine added and she diuresed with IV lasix + metolazone.Extubated to 6 liters [place].  1/13 dopamine stopped. Transitioned to po lasix. Weight up 3 pounds.  1/14 lasix switched to po demadex   Switched to torsemide yesterday. Weight down 2 pounds. Creatinine stable 1.7-1.8 range. Co-ox 52%. On 2L satting in 90s. CVP 9-10 range this am (was 12 yesterday)   Studies:  Echo (11/24/15) showed EF 45% with inferolateral hypokinesis and mildly decreased RV systolic function.  This is similar to prior echo from 12/15.    Most recent [facility] 10/31/2014: Large caliber vessel with 30% mid stenosis. The first obtuse marginal branch was a small caliber vessel (1.5 mm) with diffuse 80% stenosis in the proximal and mid segment. The second obtuse marginal branch is small in caliber with 40% mid stenosis. The left posterolateral branch has mild diffuse plaque.    Objective:   Weight Range:  Vital Signs:   Temp:  [97.7 F (36.5 C)-98.3 F (36.8 C)] 98.3 F (36.8 C) (01/15 0735) Pulse Rate:  [58-67] 67 (01/15 0735) Resp:  [12-20] 18 (01/15 0735) BP: (120-153)/(63-80) 130/80 mmHg (01/15 0735) SpO2:  [86 %-100 %] 99 % (01/15 0735) Weight:  [102.059 kg (225 lb)] 102.059 kg (225 lb) (01/15 0510) Last BM Date: 11/28/15  Weight change: Filed Weights   11/27/15 0455 11/28/15 0331 11/29/15 0510  Weight: 101.9 kg (224 lb 10.4 oz) 103.012 kg (227 lb 1.6 oz) 102.059 kg (225 lb)    Intake/Output:   Intake/Output Summary (Last 24 hours) at 11/29/15 0859 Last data filed at 11/29/15 0810  Gross per 24 hour  Intake   1260 ml  Output   1625 ml  Net   -365 ml     Physical Exam: General:   NAD.    HEENT: normal Neck: Thick. JVP hard to assess  Cor: PMI nondisplaced. Regular S1S2. No rubs, gallops or murmurs. Lungs: Poor air movement throughout. No wheezing Abdomen:  Obese soft, nontender, nondistended. No hepatosplenomegaly. No bruits or masses. Good bowel sounds. Extremities: no cyanosis, clubbing, rash. Tr ankle edema.  Neuro: Alert and Oriented x3.    Telemetry:  NSR 60s  Labs: Basic Metabolic Panel:  Recent Labs Lab 11/25/15 1752 11/26/15 0400 11/27/15 0445 11/28/15 0530 11/29/15 0430  NA 140 140 141 138 139  K 3.7 3.1* 3.1* 4.4 4.3  CL 96* 98* 90* 93* 96*  CO2 32 33* 40* 36* 35*  GLUCOSE 204* 185* 169* 135* 163*  BUN 68* 75* 57* 72* 76*  CREATININE 2.00* 1.82* 1.46* 1.79* 1.77*  CALCIUM 8.4* 8.3* 8.9 8.8* 9.0    Liver Function Tests:  Recent Labs Lab 11/24/15 0530  AST 41  ALT 54  ALKPHOS 216*  BILITOT 0.6  PROT 6.8  ALBUMIN 3.0*   No results for input(s): LIPASE, AMYLASE in the last 168 hours. No results for input(s): AMMONIA in the last 168 hours.  CBC:  Recent Labs Lab 11/24/15 0530 11/25/15 0430 11/26/15 0400 11/27/15 0445 11/28/15 0530 11/29/15 0430  WBC 10.2 8.9 13.7* 14.5* 13.6* 16.4*  NEUTROABS 8.4*  --   --  9.8*  --   --  HGB 9.1* 8.0* 8.2* 9.3* 9.1* 8.5*  HCT 29.1* 25.8* 26.5* 30.1* 28.5* 26.2*  MCV 92.1 91.5 91.4 91.8 91.6 90.7  PLT 290 267 278 304 289 330    Cardiac Enzymes:  Recent Labs Lab 11/24/15 1808 11/24/15 2300 11/25/15 0430  TROPONINI 0.28* 0.23* 0.22*    BNP: BNP (last 3 results)  Recent Labs  06/23/15 0005 10/15/15 1355 11/23/15 2234  BNP 539.4* 246.5* 523.5*    ProBNP (last 3 results) No results for input(s): PROBNP in the last 8760 hours.    Other results:  Imaging: No results found.   Medications:     Scheduled Medications: . amiodarone  400 mg Oral BID  . antiseptic oral rinse  7 mL Mouth Rinse q12n4p  . atorvastatin  40 mg Oral Daily  . bisoprolol  2.5 mg Oral  Daily  . budesonide-formoterol  2 puff Inhalation BID  . chlorhexidine  15 mL Mouth Rinse BID  . cholecalciferol  2,000 Units Oral Daily  . folic acid  1 mg Oral Daily  . gabapentin  300 mg Oral TID AC & HS  . insulin aspart  0-20 Units Subcutaneous TID WC & HS  . insulin aspart  10 Units Subcutaneous TID WC  . insulin glargine  70 Units Subcutaneous QHS  . levofloxacin  500 mg Oral Daily  . levothyroxine  50 mcg Oral QAC breakfast  . pantoprazole  40 mg Oral Daily  . potassium chloride  40 mEq Oral Daily  . [START ON 11/30/2015] predniSONE  10 mg Oral Q breakfast   Followed by  . [START ON 12/02/2015] predniSONE  5 mg Oral Q breakfast  . rivaroxaban  20 mg Oral Q supper  . senna-docusate  1 tablet Per Tube BID  . tiotropium  18 mcg Inhalation Daily  . torsemide  40 mg Oral BID    Infusions:    PRN Medications: acetaminophen, acetaminophen-codeine, albuterol, ALPRAZolam, benzonatate, fentaNYL (SUBLIMAZE) injection, hydrALAZINE, menthol-cetylpyridinium, ondansetron (ZOFRAN) IV   Assessment/Plan :   1. Acute hypoxemic respiratory failure: Suspect combination of CHF and COPD exacerbation. Extubated. 1/12 to 6 liters. Now on 2L.  - Improving with diuresis nebs, steroids, and antibiotics.  - Has severe COPD on exam. Will need PFTs after d/c, 2. Atrial fibrillation with RVR: History of PAF, on Xarelto at home. HR in 150s initially, started amiodarone gtt with eventual conversion to NSR, remains in NSR today.  - On po amio and Xarelto 3. Acute on chronic systolic CHF: EF [id number] on echo with inferolateral hypokinesis and mildly decreased RV systolic function.  She had a similar echo pattern in 12/15 and cardiac cath in 12/15 with nonobstructive and relatively mild CAD.  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  She briefly required norepinephrine, may have been due to sedation (dropped BP after intubation), now stable BP off pressors. She initially diuresed poorly,  then low dose dopamine added.  Brisk diuresis noted with dopamine.  Renal function improved. Negative 6.3 liters. Weight down 18 pounds.   - Volume status improved on switch from lasix to torsemide 40 bid. Will continue.  - BP high will start low-dose losartan. Watch renal function closely. - On low dose bisoprolol (beta-1 selective). With low co-ox and bad COPD I am going to hold this for now particularly as EF is not severely low.   4. COPD: Acute exacerbation.  Nebs, steroids, abx per CCM.  Extubated 1/12 to 6 liters. Now on 2L   5. PAD: Extensive  PAD history.  She has been on ASA, Plavix and Xarelto at home. Last peripheral PCI was in 2014.  She is off ASA and Plavix as there is likely no benefit beyond her anticoagulation alone and will make her more prone to pathologic bleeding.  6. AKI: Creatinine fairly stable. Add low-dose losartant. Watch renal function closely.  7. Anemia: Chronic.   Hgb 9.3. As above, pff Plavix and ASA. On xarelto 20 mg daily.  8. Cirrhosis: ETOH-related per report.  9. CAD: Nonobstructive on 12/15 cath.  TnI mildly elevated this admission, max 0.28 with minimal trend.  Suspect most likely demand ischemia with hypoxemia and volume overload.  Echo wall motion pattern this admission is similar to the prior echo in 12/15.  Would avoid cath for now, especially with AKI.  10. Deconditioning: Consult PT. Bethann Berkshire MD  11/29/2015 8:59 AM

## 2015-11-30 ENCOUNTER — Encounter (HOSPITAL_COMMUNITY): Payer: Medicare Other

## 2015-11-30 DIAGNOSIS — J9600 Acute respiratory failure, unspecified whether with hypoxia or hypercapnia: Secondary | ICD-10-CM

## 2015-11-30 LAB — CBC
HCT: 31.8 % — ABNORMAL LOW (ref 36.0–46.0)
HEMOGLOBIN: 9.7 g/dL — AB (ref 12.0–15.0)
MCH: 27.7 pg (ref 26.0–34.0)
MCHC: 30.5 g/dL (ref 30.0–36.0)
MCV: 90.9 fL (ref 78.0–100.0)
Platelets: 333 10*3/uL (ref 150–400)
RBC: 3.5 MIL/uL — AB (ref 3.87–5.11)
RDW: 14.3 % (ref 11.5–15.5)
WBC: 14.7 10*3/uL — ABNORMAL HIGH (ref 4.0–10.5)

## 2015-11-30 LAB — CARBOXYHEMOGLOBIN
CARBOXYHEMOGLOBIN: 1.3 % (ref 0.5–1.5)
Methemoglobin: 0.9 % (ref 0.0–1.5)
O2 SAT: 56.7 %
TOTAL HEMOGLOBIN: 10 g/dL — AB (ref 12.0–16.0)

## 2015-11-30 LAB — GLUCOSE, CAPILLARY
GLUCOSE-CAPILLARY: 227 mg/dL — AB (ref 65–99)
GLUCOSE-CAPILLARY: 301 mg/dL — AB (ref 65–99)
Glucose-Capillary: 143 mg/dL — ABNORMAL HIGH (ref 65–99)
Glucose-Capillary: 242 mg/dL — ABNORMAL HIGH (ref 65–99)
Glucose-Capillary: 371 mg/dL — ABNORMAL HIGH (ref 65–99)

## 2015-11-30 LAB — BASIC METABOLIC PANEL
Anion gap: 10 (ref 5–15)
BUN: 83 mg/dL — AB (ref 6–20)
CHLORIDE: 95 mmol/L — AB (ref 101–111)
CO2: 34 mmol/L — AB (ref 22–32)
Calcium: 9.3 mg/dL (ref 8.9–10.3)
Creatinine, Ser: 1.77 mg/dL — ABNORMAL HIGH (ref 0.44–1.00)
GFR calc Af Amer: 37 mL/min — ABNORMAL LOW (ref >60)
GFR calc non Af Amer: 32 mL/min — ABNORMAL LOW (ref >60)
GLUCOSE: 166 mg/dL — AB (ref 65–99)
POTASSIUM: 4.7 mmol/L (ref 3.5–5.1)
Sodium: 139 mmol/L (ref 135–145)

## 2015-11-30 MED ORDER — GLUCERNA SHAKE PO LIQD
237.0000 mL | Freq: Two times a day (BID) | ORAL | Status: DC
  Administered 2015-12-01 (×2): 237 mL via ORAL

## 2015-11-30 MED ORDER — RIVAROXABAN 15 MG PO TABS
15.0000 mg | ORAL_TABLET | Freq: Every day | ORAL | Status: DC
  Administered 2015-11-30 – 2015-12-02 (×3): 15 mg via ORAL
  Filled 2015-11-30 (×3): qty 1

## 2015-11-30 NOTE — Discharge Instructions (Addendum)
Information on my medicine - XARELTO (Rivaroxaban)  Why was Xarelto prescribed for you? Xarelto was prescribed for you to reduce the risk of a blood clot forming that can cause a stroke if you have a medical condition called atrial fibrillation (a type of irregular heartbeat).  What do you need to know about xarelto ? Take your Xarelto ONCE DAILY at the same time every day with your evening meal. If you have difficulty swallowing the tablet whole, you may crush it and mix in applesauce just prior to taking your dose.  Take Xarelto exactly as prescribed by your doctor and DO NOT stop taking Xarelto without talking to the doctor who prescribed the medication.  Stopping without other stroke prevention medication to take the place of Xarelto may increase your risk of developing a clot that causes a stroke.  Refill your prescription before you run out.  After discharge, you should have regular check-up appointments with your healthcare provider that is prescribing your Xarelto.  In the future your dose may need to be changed if your kidney function or weight changes by a significant amount.  What do you do if you miss a dose? If you are taking Xarelto ONCE DAILY and you miss a dose, take it as soon as you remember on the same day then continue your regularly scheduled once daily regimen the next day. Do not take two doses of Xarelto at the same time or on the same day.   Important Safety Information A possible side effect of Xarelto is bleeding. You should call your healthcare provider right away if you experience any of the following: ? Bleeding from an injury or your nose that does not stop. ? Unusual colored urine (red or dark brown) or unusual colored stools (red or black). ? Unusual bruising for unknown reasons. ? A serious fall or if you hit your head (even if there is no bleeding).  Some medicines may interact with Xarelto and might increase your risk of bleeding while on  Xarelto. To help avoid this, consult your healthcare provider or pharmacist prior to using any new prescription or non-prescription medications, including herbals, vitamins, non-steroidal anti-inflammatory drugs (NSAIDs) and supplements.  This website has more information on Xarelto: [email or web address]    Heart Failure Heart failure is a condition in which the heart has trouble pumping blood. This means your heart does not pump blood efficiently for your body to work well. In some cases of heart failure, fluid may back up into your lungs or you may have swelling (edema) in your lower legs. Heart failure is usually a long-term (chronic) condition. It is important for you to take good care of yourself and follow your health care provider's treatment plan. CAUSES  Some health conditions can cause heart failure. Those health conditions include:  High blood pressure (hypertension). Hypertension causes the heart muscle to work harder than normal. When pressure in the blood vessels is high, the heart needs to pump (contract) with more force in order to circulate blood throughout the body. High blood pressure eventually causes the heart to become stiff and weak.  Coronary artery disease (CAD). CAD is the buildup of cholesterol and fat (plaque) in the arteries of the heart. The blockage in the arteries deprives the heart muscle of oxygen and blood. This can cause chest pain and may lead to a heart attack. High blood pressure can also contribute to CAD.  Heart attack (myocardial infarction). A heart attack occurs when one or more arteries in the  heart become blocked. The loss of oxygen damages the muscle tissue of the heart. When this happens, part of the heart muscle dies. The injured tissue does not contract as well and weakens the heart's ability to pump blood.  Abnormal heart valves. When the heart valves do not open and close properly, it can cause heart failure. This makes the heart muscle pump harder  to keep the blood flowing.  Heart muscle disease (cardiomyopathy or myocarditis). Heart muscle disease is damage to the heart muscle from a variety of causes. These can include drug or alcohol abuse, infections, or unknown reasons. These can increase the risk of heart failure.  Lung disease. Lung disease makes the heart work harder because the lungs do not work properly. This can cause a strain on the heart, leading it to fail.  Diabetes. Diabetes increases the risk of heart failure. High blood sugar contributes to high fat (lipid) levels in the blood. Diabetes can also cause slow damage to tiny blood vessels that carry important nutrients to the heart muscle. When the heart does not get enough oxygen and food, it can cause the heart to become weak and stiff. This leads to a heart that does not contract efficiently.  Other conditions can contribute to heart failure. These include abnormal heart rhythms, thyroid problems, and low blood counts (anemia). Certain unhealthy behaviors can increase the risk of heart failure, including:  Being overweight.  Smoking or chewing tobacco.  Eating foods high in fat and cholesterol.  Abusing illicit drugs or alcohol.  Lacking physical activity. SYMPTOMS  Heart failure symptoms may vary and can be hard to detect. Symptoms may include:  Shortness of breath with activity, such as climbing stairs.  Persistent cough.  Swelling of the feet, ankles, legs, or abdomen.  Unexplained weight gain.  Difficulty breathing when lying flat (orthopnea).  Waking from sleep because of the need to sit up and get more air.  Rapid heartbeat.  Fatigue and loss of energy.  Feeling light-headed, dizzy, or close to fainting.  Loss of appetite.  Nausea.  Increased urination during the night (nocturia). DIAGNOSIS  A diagnosis of heart failure is based on your history, symptoms, physical examination, and diagnostic tests. Diagnostic tests for heart failure may  include:  Echocardiography.  Electrocardiography.  Chest X-ray.  Blood tests.  Exercise stress test.  Cardiac angiography.  Radionuclide scans. TREATMENT  Treatment is aimed at managing the symptoms of heart failure. Medicines, behavioral changes, or surgical intervention may be necessary to treat heart failure.  Medicines to help treat heart failure may include:  Angiotensin-converting enzyme (ACE) inhibitors. This type of medicine blocks the effects of a blood protein called angiotensin-converting enzyme. ACE inhibitors relax (dilate) the blood vessels and help lower blood pressure.  Angiotensin receptor blockers (ARBs). This type of medicine blocks the actions of a blood protein called angiotensin. Angiotensin receptor blockers dilate the blood vessels and help lower blood pressure.  Water pills (diuretics). Diuretics cause the kidneys to remove salt and water from the blood. The extra fluid is removed through urination. This loss of extra fluid lowers the volume of blood the heart pumps.  Beta blockers. These prevent the heart from beating too fast and improve heart muscle strength.  Digitalis. This increases the force of the heartbeat.  Healthy behavior changes include:  Obtaining and maintaining a healthy weight.  Stopping smoking or chewing tobacco.  Eating heart-healthy foods.  Limiting or avoiding alcohol.  Stopping illicit drug use.  Physical activity as directed by  your health care provider.  Surgical treatment for heart failure may include:  A procedure to open blocked arteries, repair damaged heart valves, or remove damaged heart muscle tissue.  A pacemaker to improve heart muscle function and control certain abnormal heart rhythms.  An internal cardioverter defibrillator to treat certain serious abnormal heart rhythms.  A left ventricular assist device (LVAD) to assist the pumping ability of the heart. HOME CARE INSTRUCTIONS   Take medicines only  as directed by your health care provider. Medicines are important in reducing the workload of your heart, slowing the progression of heart failure, and improving your symptoms.  Do not stop taking your medicine unless directed by your health care provider.  Do not skip any dose of medicine.  Refill your prescriptions before you run out of medicine. Your medicines are needed every day.  Engage in moderate physical activity if directed by your health care provider. Moderate physical activity can benefit some people. The elderly and people with severe heart failure should consult with a health care provider for physical activity recommendations.  Eat heart-healthy foods. Food choices should be free of trans fat and low in saturated fat, cholesterol, and salt (sodium). Healthy choices include fresh or frozen fruits and vegetables, fish, lean meats, legumes, fat-free or low-fat dairy products, and whole grain or high fiber foods. Talk to a dietitian to learn more about heart-healthy foods.  Limit sodium if directed by your health care provider. Sodium restriction may reduce symptoms of heart failure in some people. Talk to a dietitian to learn more about heart-healthy seasonings.  Use healthy cooking methods. Healthy cooking methods include roasting, grilling, broiling, baking, poaching, steaming, or stir-frying. Talk to a dietitian to learn more about healthy cooking methods.  Limit fluids if directed by your health care provider. Fluid restriction may reduce symptoms of heart failure in some people.  Weigh yourself every day. Daily weights are important in the early recognition of excess fluid. You should weigh yourself every morning after you urinate and before you eat breakfast. Wear the same amount of clothing each time you weigh yourself. Record your daily weight. Provide your health care provider with your weight record.  Monitor and record your blood pressure if directed by your health care  provider.  Check your pulse if directed by your health care provider.  Lose weight if directed by your health care provider. Weight loss may reduce symptoms of heart failure in some people.  Stop smoking or chewing tobacco. Nicotine makes your heart work harder by causing your blood vessels to constrict. Do not use nicotine gum or patches before talking to your health care provider.  Keep all follow-up visits as directed by your health care provider. This is important.  Limit alcohol intake to no more than 1 drink per day for nonpregnant women and 2 drinks per day for men. One drink equals 12 ounces of beer, 5 ounces of wine, or 1 ounces of hard liquor. Drinking more than that is harmful to your heart. Tell your health care provider if you drink alcohol several times a week. Talk with your health care provider about whether alcohol is safe for you. If your heart has already been damaged by alcohol or you have severe heart failure, drinking alcohol should be stopped completely.  Stop illicit drug use.  Stay up-to-date with immunizations. It is especially important to prevent respiratory infections through current pneumococcal and influenza immunizations.  Manage other health conditions such as hypertension, diabetes, thyroid disease, or abnormal  heart rhythms as directed by your health care provider.  Learn to manage stress.  Plan rest periods when fatigued.  Learn strategies to manage high temperatures. If the weather is extremely hot:  Avoid vigorous physical activity.  Use air conditioning or fans or seek a cooler location.  Avoid caffeine and alcohol.  Wear loose-fitting, lightweight, and light-colored clothing.  Learn strategies to manage cold temperatures. If the weather is extremely cold:  Avoid vigorous physical activity.  Layer clothes.  Wear mittens or gloves, a hat, and a scarf when going outside.  Avoid alcohol.  Obtain ongoing education and support as  needed.  Participate in or seek rehabilitation as needed to maintain or improve independence and quality of life. SEEK MEDICAL CARE IF:   You have a rapid weight gain.  You have increasing shortness of breath that is unusual for you.  You are unable to participate in your usual physical activities.  You tire easily.  You cough more than normal, especially with physical activity.  You have any or more swelling in areas such as your hands, feet, ankles, or abdomen.  You are unable to sleep because it is hard to breathe.  You feel like your heart is beating fast (palpitations).  You become dizzy or light-headed upon standing up. SEEK IMMEDIATE MEDICAL CARE IF:   You have difficulty breathing.  There is a change in mental status such as decreased alertness or difficulty with concentration.  You have a pain or discomfort in your chest.  You have an episode of fainting (syncope). MAKE SURE YOU:   Understand these instructions.  Will watch your condition.  Will get help right away if you are not doing well or get worse.   This information is not intended to replace advice given to you by your health care provider. Make sure you discuss any questions you have with your health care provider.   Document Released: 10/31/2005 Document Revised: 03/17/2015 Document Reviewed: 11/30/2012 Elsevier Interactive Patient Education [employer].

## 2015-11-30 NOTE — Care Management Important Message (Signed)
Important Message  Patient Details  Name: [name] MRN: [id number] Date of Birth: Mar 31, 1961   Medicare Important Message Given:  Yes    [name] 11/30/2015, 12:43 PM

## 2015-11-30 NOTE — Progress Notes (Addendum)
Patient ID: [name], female   DOB: [birth year], 55 y.o.   MRN: [id number]    Advanced Heart Failure Rounding Note   Subjective:    She was intubated 1/10 for hypoxemic respiratory failure and increased workup of breathing.  She converted back to NSR on amiodarone gtt and remains on amiodarone gtt.    1/12 renal dose dopamine added and she diuresed with IV lasix + metolazone.Extubated to 6 liters [place].  1/13 dopamine stopped. Transitioned to po lasix. Weight up 3 pounds.  1/14 lasix switched to po demadex   Switched to torsemide 11/28/15. Weight down another 4 pounds. Creatinine stable 1.7-1.8 range. No Co-ox this am. Will get now.  On 2L satting in 90s. CVP 8-9, checked personally.  Studies:  Echo (11/24/15) showed EF 45% with inferolateral hypokinesis and mildly decreased RV systolic function.  This is similar to prior echo from 12/15.    Most recent [place] 10/31/2014: Large caliber vessel with 30% mid stenosis. The first obtuse marginal branch was a small caliber vessel (1.5 mm) with diffuse 80% stenosis in the proximal and mid segment. The second obtuse marginal branch is small in caliber with 40% mid stenosis. The left posterolateral branch has mild diffuse plaque.    Objective:   Weight Range:  Vital Signs:   Temp:  [97.6 F (36.4 C)-98.6 F (37 C)] 97.9 F (36.6 C) (01/16 0727) Pulse Rate:  [58-69] 58 (01/16 0727) Resp:  [16-20] 16 (01/16 0727) BP: (118-128)/(64-79) 128/71 mmHg (01/16 0727) SpO2:  [92 %-99 %] 99 % (01/16 0727) Weight:  [221 lb 11.2 oz (100.562 kg)] 221 lb 11.2 oz (100.562 kg) (01/16 0503) Last BM Date: 11/29/15  Weight change: Filed Weights   11/28/15 0331 11/29/15 0510 11/30/15 0503  Weight: 227 lb 1.6 oz (103.012 kg) 225 lb (102.059 kg) 221 lb 11.2 oz (100.562 kg)    Intake/Output:   Intake/Output Summary (Last 24 hours) at 11/30/15 0858 Last data filed at 11/30/15 0829  Gross per 24 hour  Intake   1620 ml  Output   2375 ml  Net   -755 ml      Physical Exam: General:  NAD.    HEENT: normal Neck: Thick. JVP difficult to assess  Cor: PMI nondisplaced. Regular S1S2. No M/G/R noted Lungs: Decreased throughout, mild basilar crackles.  Abdomen:  Obese soft, NT, ND, no HSM. No bruits or masses. +BS  Extremities: no cyanosis, clubbing, rash. Tr ankle edema.  Neuro: Alert and Oriented x3.   Telemetry:  Reviewed personally, Sinus Brady 50-60s  Labs: Basic Metabolic Panel:  Recent Labs Lab 11/26/15 0400 11/27/15 0445 11/28/15 0530 11/29/15 0430 11/30/15 0524  NA 140 141 138 139 139  K 3.1* 3.1* 4.4 4.3 4.7  CL 98* 90* 93* 96* 95*  CO2 33* 40* 36* 35* 34*  GLUCOSE 185* 169* 135* 163* 166*  BUN 75* 57* 72* 76* 83*  CREATININE 1.82* 1.46* 1.79* 1.77* 1.77*  CALCIUM 8.3* 8.9 8.8* 9.0 9.3    Liver Function Tests:  Recent Labs Lab 11/24/15 0530  AST 41  ALT 54  ALKPHOS 216*  BILITOT 0.6  PROT 6.8  ALBUMIN 3.0*   No results for input(s): LIPASE, AMYLASE in the last 168 hours. No results for input(s): AMMONIA in the last 168 hours.  CBC:  Recent Labs Lab 11/24/15 0530  11/26/15 0400 11/27/15 0445 11/28/15 0530 11/29/15 0430 11/30/15 0524  WBC 10.2  < > 13.7* 14.5* 13.6* 16.4* 14.7*  NEUTROABS 8.4*  --   --  9.8*  --   --   --   HGB 9.1*  < > 8.2* 9.3* 9.1* 8.5* 9.7*  HCT 29.1*  < > 26.5* 30.1* 28.5* 26.2* 31.8*  MCV 92.1  < > 91.4 91.8 91.6 90.7 90.9  PLT 290  < > 278 304 289 330 333  < > = values in this interval not displayed.  Cardiac Enzymes:  Recent Labs Lab 11/24/15 1808 11/24/15 2300 11/25/15 0430  TROPONINI 0.28* 0.23* 0.22*    BNP: BNP (last 3 results)  Recent Labs  06/23/15 0005 10/15/15 1355 11/23/15 2234  BNP 539.4* 246.5* 523.5*    ProBNP (last 3 results) No results for input(s): PROBNP in the last 8760 hours.    Other results:  Imaging: No results found.   Medications:     Scheduled Medications: . amiodarone  400 mg Oral BID  . antiseptic oral rinse  7  mL Mouth Rinse q12n4p  . atorvastatin  40 mg Oral Daily  . budesonide-formoterol  2 puff Inhalation BID  . chlorhexidine  15 mL Mouth Rinse BID  . cholecalciferol  2,000 Units Oral Daily  . folic acid  1 mg Oral Daily  . gabapentin  300 mg Oral TID AC & HS  . insulin aspart  0-20 Units Subcutaneous TID WC & HS  . insulin aspart  10 Units Subcutaneous TID WC  . insulin glargine  70 Units Subcutaneous QHS  . levofloxacin  500 mg Oral Daily  . levothyroxine  50 mcg Oral QAC breakfast  . losartan  25 mg Oral Daily  . pantoprazole  40 mg Oral Daily  . potassium chloride  40 mEq Oral Daily  . predniSONE  10 mg Oral Q breakfast   Followed by  . [START ON 12/02/2015] predniSONE  5 mg Oral Q breakfast  . rivaroxaban  20 mg Oral Q supper  . senna-docusate  1 tablet Per Tube BID  . tiotropium  18 mcg Inhalation Daily  . torsemide  40 mg Oral BID    Infusions:    PRN Medications: acetaminophen, acetaminophen-codeine, albuterol, ALPRAZolam, benzonatate, fentaNYL (SUBLIMAZE) injection, hydrALAZINE, menthol-cetylpyridinium, ondansetron (ZOFRAN) IV   Assessment/Plan :   1. Acute hypoxemic respiratory failure: Suspect combination of CHF and COPD exacerbation. Extubated. 1/12 to 6 liters. Now on 2L.  - Improving with diuresis nebs, steroids, and antibiotics.  - Has severe COPD on exam. Will need PFTs as outpatient. 2. Atrial fibrillation with RVR: History of PAF, on Xarelto at home.  - Remains in NSR in 50-60s. Converted with amio drip. HR in 150s intially.  - Continue po amio and Xarelto 3. Acute on chronic systolic CHF: EF [id number] on echo with inferolateral hypokinesis and mildly decreased RV systolic function.  She had a similar echo pattern in 12/15 and cardiac cath in 12/15 with nonobstructive and relatively mild CAD.  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  She briefly required norepinephrine, may have been due to sedation (dropped BP after intubation), now stable  BP off pressors. She initially diuresed poorly, then low dose dopamine added.  Brisk diuresis noted with dopamine.   - Renal function stable. Negative 5.7 liters overal. Weight down 21 lbs overall. - Volume status continues to improve on torsemide 40 bid.  - Losartan 25 mg daily added 11/29/15 - Hold bisoprolol (beta-1 selective) for now with low co-ox and bad COPD.   4. COPD: Acute exacerbation.  - Nebs, steroids, abx per CCM. She is being tapered off the steroids  and has one more day of antibiotics.  - Extubated 1/12 to 6 liters. Now stable on 2L   5. PAD: Extensive PAD history.  She had been on ASA, Plavix and Xarelto at home. Last peripheral PCI was in 2014.   - Now off ASA and Plavix. Minimal benefit in combination with Xarelto and increased risk of bleeding.   6. AKI: - Creatinine and K stable with addition of losartan yesterday. - Continue to follow closely.  7. Anemia: Chronic.    - Hgb 9.7. Improved.  - Off Plavix and ASA. On xarelto 20 mg daily.  8. Cirrhosis:  - ETOH-related per report.  9. CAD: Nonobstructive on 12/15 cath.   - TnI mildly elevated this admission, max 0.28 with minimal trend.   - Likely demand ischemia with hypoxemia and volume overload.  - Echo wall motion pattern relatively unchanged from prior echo in 12/15.  - Avoid cath for now, especially with AKI.  10. Deconditioning:  - Continue PT. Current recommendation HHPT with 24 hr supervision.   Likely home in 24-48 hours.   Shirley Friar PA-C 11/30/2015 8:58 AM   Advanced Heart Failure Team Pager [phone] (M-F; 7a - 4p)  Please contact [facility] for night-coverage after hours (4p -7a ) and weekends on [email or web address]  Patient seen with PA, agree with the above note.  Still has cough but feeling better overall.  Walked in halls.  Still on oxygen.  Co-ox 57%, CVP 8-9 cm.  She remains in NSR by telemetry.  - Would continue torsemide 40 mg bid today for a bit more diuresis, may send her home on  torsemide 40 qam/20 qpm.  Creatinine stable but need to watch rise in BUN.  - Off bisoprolol now and on losartan 25 mg daily.  Stopped bisoprolol with low co-ox, now somewhat improved.  - She is not on home oxygen, however remains on 2L [place] here.  May need oxygen at discharge.  - Tapering off steroids, has 1 more day of antibiotics.  - GFR < 50, would use 15 mg Xarelto.   Will see how she's doing tomorrow but may be able to go home.   Loralie Champagne 11/30/2015 12:30 PM

## 2015-11-30 NOTE — Progress Notes (Addendum)
PULMONARY / CRITICAL CARE MEDICINE   Name: [name] MRN: [id number] DOB: [birth year]    HISTORY OF PRESENT ILLNESS:  55 yo wf with acute resp distress with a fib rvr, decompensated with increased WOB requiring intubation on 1/10. Diuresed, BDs, steroids. Extubated 1/12.   SUBJECTIVE: Doing better today, decreasing O2 demands.   VITAL SIGNS: Temp:  [97.7 F (36.5 C)-98.6 F (37 C)] 97.7 F (36.5 C) (01/16 1107) Pulse Rate:  [58-59] 58 (01/16 1107) Resp:  [16-20] 16 (01/16 1107) BP: (119-128)/(64-76) 119/76 mmHg (01/16 1107) SpO2:  [93 %-99 %] 93 % (01/16 1107) Weight:  [100.562 kg (221 lb 11.2 oz)] 100.562 kg (221 lb 11.2 oz) (01/16 0503) HEMODYNAMICS: CVP:  [6 mmHg-10 mmHg] 9 mmHg VENTILATOR SETTINGS:   INTAKE / OUTPUT: Intake/Output      01/15 0701 - 01/16 0700 01/16 0701 - 01/17 0700   P.O. 1680 240   Total Intake(mL/kg) 1680 (16.7) 240 (2.4)   Urine (mL/kg/hr) 2375 (1) 1300 (2.3)   Stool 0 (0) 1 (0)   Total Output 2375 1301   Net -695 -1061        Urine Occurrence 1 x    Stool Occurrence 1 x      PHYSICAL EXAMINATION: Gen: Resting comfortably in hospital bed. HEENT: [place]/At, PERRL, No appreciable JVD PULM: Wheezing improved.  CV: RRR. No murmur, rubs, or gallop.  GI: BS+, soft, nontender, nondistended Ext: trace edema, warm Neuro: A&Ox4, following commands  LABS:  CBC  Recent Labs Lab 11/28/15 0530 11/29/15 0430 11/30/15 0524  WBC 13.6* 16.4* 14.7*  HGB 9.1* 8.5* 9.7*  HCT 28.5* 26.2* 31.8*  PLT 289 330 333   Coag's  Recent Labs Lab 11/25/15 1300 11/25/15 1940 11/26/15 0400  APTT 61* 58* 60*   BMET  Recent Labs Lab 11/28/15 0530 11/29/15 0430 11/30/15 0524  NA 138 139 139  K 4.4 4.3 4.7  CL 93* 96* 95*  CO2 36* 35* 34*  BUN 72* 76* 83*  CREATININE 1.79* 1.77* 1.77*  GLUCOSE 135* 163* 166*   Electrolytes  Recent Labs Lab 11/28/15 0530 11/29/15 0430 11/30/15 0524  CALCIUM 8.8* 9.0 9.3   Sepsis Markers No results for  input(s): LATICACIDVEN, PROCALCITON, O2SATVEN in the last 168 hours. ABG  Recent Labs Lab 11/24/15 0251 11/24/15 1150 11/27/15 0344  PHART 7.400 7.291* 7.509*  PCO2ART 55.9* 79.6* 54.0*  PO2ART 92.0 543.0* 63.0*   Liver Enzymes  Recent Labs Lab 11/24/15 0530  AST 41  ALT 54  ALKPHOS 216*  BILITOT 0.6  ALBUMIN 3.0*   Cardiac Enzymes  Recent Labs Lab 11/24/15 1808 11/24/15 2300 11/25/15 0430  TROPONINI 0.28* 0.23* 0.22*   Glucose  Recent Labs Lab 11/29/15 1200 11/29/15 1617 11/29/15 1938 11/30/15 0026 11/30/15 0726 11/30/15 1105  GLUCAP 182* 318* 371* 242* 143* 227*    Imaging No results found.   CXR: Low lung volumes with vascular crowding and bibasilar atelectasis. Suspect small pleural effusions.   DISCUSSION: 55 yo wf with acute resp distress with a fib rvr, decompensated with increased WOB requiring intubation on 1/10. Now diuresing well, passing SBT.  ASSESSMENT / PLAN:  Acute hypoxemic respiratory failure, suspect due primarily to CHF in setting AF RVR Acute bronchitis > may have asthma but doesn't have COPD By PFT or CT chest -Continue supplemental O2 to keep SpO2 > 90% -Continue prednisone, tapering -Continue bronchodilators (symnicort, spiriva, PRN albuterol) -Levaquin stop date today -Will need pulmonary follow up as outpatient for repeat spirogram  CHF, acute  decompensated A fib with RVR HTN -Cardiology primary -Amiodarone, Xarelto -Diuresis per cardiology, monitor UOP and renal fxn, BP  Georgann Housekeeper, AGACNP-BC [facility] Pulmonology/Critical Care Pager [phone] or [phone]  11/30/2015 12:49 PM

## 2015-11-30 NOTE — Progress Notes (Signed)
Physical Therapy Treatment Patient Details Name: [name] MRN: [id number] DOB: 06-Nov-1961 Today's Date: 11/30/2015    History of Present Illness Patient is a 55 y/o female with hx of neuropathies, polysubstance, HF, COPD and DM presents with worsening sob in setting of increased sputum production and cough likely due to combined decompensated systolic heart failure and COPD exacerbation. She continues to have hypoxia with activity     PT Comments    Pt appears to be improved in functional mobility indpendence, but she still is limited by decreased O2 sats with activity.  Discussed with her trying to be move active in small bouts throughout the day, with standing for minutes at a time when nursing is in the room.   Follow Up Recommendations  HHPT      Equipment Recommendations  RW and Home O2   Recommendations for Other Services       Precautions / Restrictions Precautions Precautions: Other (comment) Precaution Comments: O2 desaturation with activity  Restrictions Weight Bearing Restrictions: No    Mobility  Bed Mobility               General bed mobility comments: sitting up in chair upon arrival   Transfers Overall transfer level: Modified independent Equipment used: Rolling walker (2 wheeled) Transfers: Sit to/from Stand Sit to Stand: Modified independent (Device/Increase time)         General transfer comment: sit to stand x ~ 10 times during session for strengthening with pt able to do with rare cues   Ambulation/Gait Ambulation/Gait assistance: Supervision Ambulation Distance (Feet): 125 Feet Assistive device: Rolling walker (2 wheeled) Gait Pattern/deviations: Step-to pattern     General Gait Details: slow due to multipe lines, and O2 tank.  Pt did have O2 desaturation that registered to 98s with anbultaion though pt showed only minor dyspea    Stairs            Wheelchair Mobility    Modified Rankin (Stroke Patients Only)        Balance Overall balance assessment: Modified Independent Sitting-balance support: No upper extremity supported;Feet supported Sitting balance-Leahy Scale: Normal     Standing balance support: Single extremity supported;No upper extremity supported Standing balance-Leahy Scale: Good Standing balance comment: able to stand without hand support for a short time, desaturates with this activity                     Cognition Arousal/Alertness: Awake/alert Behavior During Therapy: WFL for tasks assessed/performed                        Exercises      General Comments General comments (skin integrity, edema, etc.): pt appears to be improving with mobility, but still limited by desaturation witha activity.  Continues to have telemetry and a central line       Pertinent Vitals/Pain Pain Assessment: No/denies pain    Home Living                      Prior Function            PT Goals (current goals can now be found in the care plan section) Acute Rehab PT Goals Patient Stated Goal: to get stonger and go home  Progress towards PT goals: Progressing toward goals    Frequency       PT Plan Current plan remains appropriate    Co-evaluation  End of Session Equipment Utilized During Treatment: Oxygen Activity Tolerance: Treatment limited secondary to medical complications (Comment) (drop in O2 saturation despite 2L O2) Patient left: in chair;with call bell/phone within reach;with nursing/sitter in room     Time: 1035-1100 PT Time Calculation (min) (ACUTE ONLY): 25 min  Charges:  $Therapeutic Activity: 23-37 mins                    G Codes:     Stuti Sandin K. Owens Shark, PT  11/30/2015, 11:36 AM

## 2015-11-30 NOTE — Progress Notes (Signed)
Inpatient Diabetes Program Recommendations  AACE/ADA: New Consensus Statement on Inpatient Glycemic Control (2015)  Target Ranges:  Prepandial:   less than 140 mg/dL      Peak postprandial:   less than 180 mg/dL (1-2 hours)      Critically ill patients:  140 - 180 mg/dL   Results for [name], [name] (MRN [id number]) as of 11/30/2015 12:22  Ref. Range 11/29/2015 08:08 11/29/2015 12:00 11/29/2015 16:17 11/29/2015 19:38 11/30/2015 00:26 11/30/2015 07:26 11/30/2015 11:05  Glucose-Capillary Latest Ref Range: 65-99 mg/dL 154 (H) 182 (H) 318 (H) 371 (H) 242 (H) 143 (H) 227 (H)   Review of Glycemic Control  Diabetes history: DM 2 Outpatient Diabetes medications: Lantus 60 units, Novolog 6-14 units TID, Glipizide 20 mg Daily Current orders for Inpatient glycemic control: Lantus 70, Novolog Resistant TID + Novolog 10 units Meal coverage  Inpatient Diabetes Program Recommendations: Insulin - Meal Coverage: Patient takes Novolog 6-14 units TID meal coverage at home. Please consider increasing Meal Coverage to Novolog 14 units TID.  Thanks,  Tama Headings RN, MSN, [facility] Inpatient Diabetes Coordinator Team Pager [phone] (8a-5p)

## 2015-11-30 NOTE — Progress Notes (Signed)
Nutrition Follow-up  DOCUMENTATION CODES:   Obesity unspecified  INTERVENTION:  -Glucerna Shake po BID, each supplement provides 220 kcal and 10 grams of protein    NUTRITION DIAGNOSIS:   Inadequate oral intake related to inability to eat as evidenced by NPO status.  Pt is able to eat now, has good appetite  GOAL:   Provide needs based on ASPEN/SCCM guidelines  Goal is for patient to meet 90% of needs PO now.  MONITOR:   Vent status, Labs, Weight trends, TF tolerance, I & O's  REASON FOR ASSESSMENT:   Consult Enteral/tube feeding initiation and management  ASSESSMENT:   22yoF with hx of HTN, DM2, HLD, active smoking, PAD, Carotid disease, nonobstructive coronary disease, non-ischemic cardiomyopathy with chronic elevated troponins, EF 40%, COPD, presents with 2 days of worsening sob in setting of recent increased sputum production and increased cough.  Spoke with pt briefly at bedside. Pt has no acute complaints. No n/v/d/c. No chewing or swallowing problems. 100% PO intake at all previous meals. PT states she is hungry more than anything. Will provide GS BID to help.  Diet Order:  Diet heart healthy/carb modified Room service appropriate?: Yes; Fluid consistency:: Thin; Fluid restriction:: 1200 mL Fluid  Skin:  Reviewed, no issues  Last BM:  1/9  Height:   Ht Readings from Last 1 Encounters:  11/24/15 5\' 4"  (1.626 m)    Weight:   Wt Readings from Last 1 Encounters:  11/30/15 221 lb 11.2 oz (100.562 kg)    Ideal Body Weight:  54.5 kg  BMI:  Body mass index is 38.04 kg/(m^2).  Estimated Nutritional Needs:   Kcal:  [id number]  Protein:  110-120 gm  Fluid:  1.8 L  EDUCATION NEEDS:   No education needs identified at this time  Satira Anis. Denise Bramblett, MS, RD LDN After Hours/Weekend Pager [phone]

## 2015-11-30 NOTE — Progress Notes (Signed)
Pt oxygen saturation 71% with oxygen out of nares and asleep.  Upon awakening pt and replacing O2 at 2L per [place] sat 100%.  Had been able to wean oxygen while awake to 1/2 Liters per min.  Will keep on oxygen and continuous oxygen saturation monitoring overnight to maintain sats.

## 2015-12-01 ENCOUNTER — Ambulatory Visit: Payer: Medicare Other | Admitting: Cardiovascular Disease

## 2015-12-01 DIAGNOSIS — N179 Acute kidney failure, unspecified: Secondary | ICD-10-CM | POA: Insufficient documentation

## 2015-12-01 LAB — GLUCOSE, CAPILLARY
GLUCOSE-CAPILLARY: 454 mg/dL — AB (ref 65–99)
GLUCOSE-CAPILLARY: 342 mg/dL — AB (ref 65–99)
GLUCOSE-CAPILLARY: 369 mg/dL — AB (ref 65–99)
GLUCOSE-CAPILLARY: 382 mg/dL — AB (ref 65–99)
Glucose-Capillary: 209 mg/dL — ABNORMAL HIGH (ref 65–99)
Glucose-Capillary: 274 mg/dL — ABNORMAL HIGH (ref 65–99)

## 2015-12-01 LAB — BASIC METABOLIC PANEL
Anion gap: 8 (ref 5–15)
BUN: 94 mg/dL — AB (ref 6–20)
CALCIUM: 8.7 mg/dL — AB (ref 8.9–10.3)
CO2: 33 mmol/L — ABNORMAL HIGH (ref 22–32)
Chloride: 94 mmol/L — ABNORMAL LOW (ref 101–111)
Creatinine, Ser: 2.19 mg/dL — ABNORMAL HIGH (ref 0.44–1.00)
GFR calc Af Amer: 28 mL/min — ABNORMAL LOW (ref >60)
GFR, EST NON AFRICAN AMERICAN: 24 mL/min — AB (ref >60)
GLUCOSE: 201 mg/dL — AB (ref 65–99)
Potassium: 4.9 mmol/L (ref 3.5–5.1)
Sodium: 135 mmol/L (ref 135–145)

## 2015-12-01 LAB — CBC
HCT: 32.5 % — ABNORMAL LOW (ref 36.0–46.0)
Hemoglobin: 10.1 g/dL — ABNORMAL LOW (ref 12.0–15.0)
MCH: 28.5 pg (ref 26.0–34.0)
MCHC: 31.1 g/dL (ref 30.0–36.0)
MCV: 91.5 fL (ref 78.0–100.0)
PLATELETS: 371 10*3/uL (ref 150–400)
RBC: 3.55 MIL/uL — ABNORMAL LOW (ref 3.87–5.11)
RDW: 14.1 % (ref 11.5–15.5)
WBC: 16.1 10*3/uL — ABNORMAL HIGH (ref 4.0–10.5)

## 2015-12-01 LAB — CARBOXYHEMOGLOBIN
Carboxyhemoglobin: 1.3 % (ref 0.5–1.5)
METHEMOGLOBIN: 1 % (ref 0.0–1.5)
O2 Saturation: 64.4 %
Total hemoglobin: 10.3 g/dL — ABNORMAL LOW (ref 12.0–16.0)

## 2015-12-01 LAB — BRAIN NATRIURETIC PEPTIDE: B Natriuretic Peptide: 174.9 pg/mL — ABNORMAL HIGH (ref 0.0–100.0)

## 2015-12-01 MED ORDER — INSULIN ASPART 100 UNIT/ML ~~LOC~~ SOLN
14.0000 [IU] | Freq: Three times a day (TID) | SUBCUTANEOUS | Status: DC
  Administered 2015-12-01 – 2015-12-03 (×7): 14 [IU] via SUBCUTANEOUS

## 2015-12-01 MED ORDER — POTASSIUM CHLORIDE CRYS ER 20 MEQ PO TBCR: 20.0000 meq | EXTENDED_RELEASE_TABLET | Freq: Every day | ORAL | Status: DC

## 2015-12-01 MED ORDER — ACETAMINOPHEN 500 MG PO TABS
1000.0000 mg | ORAL_TABLET | Freq: Four times a day (QID) | ORAL | Status: DC | PRN
  Administered 2015-12-01: 1000 mg via ORAL
  Filled 2015-12-01: qty 2

## 2015-12-01 MED ORDER — INSULIN ASPART 100 UNIT/ML ~~LOC~~ SOLN
14.0000 [IU] | Freq: Once | SUBCUTANEOUS | Status: AC
  Administered 2015-12-01: 14 [IU] via SUBCUTANEOUS

## 2015-12-01 NOTE — Progress Notes (Signed)
Pt FSBS [id number], pt states she feels hot and nauseated.  Barrington Ellison, PA notified and orders received to administer 20 units SSI novolog and 14 units meal coverage and re check FSBS in one hour.  Will continue to closely monitor.

## 2015-12-01 NOTE — Progress Notes (Signed)
PULMONARY / CRITICAL CARE MEDICINE   Name: [name] MRN: [id number] DOB: [birth year]    HISTORY OF PRESENT ILLNESS:  55 yo wf with acute resp distress with a fib rvr, decompensated with increased WOB requiring intubation on 1/10. Diuresed, BDs, steroids. Extubated 1/12.   SUBJECTIVE: desaturation overnight while when [place] fell out down to 71% overall feeling better, still with some chest congestion. Ambulated yesterday with some mild dyspnea, sats ok on 2L.  VITAL SIGNS: Temp:  [97.7 F (36.5 C)-98.5 F (36.9 C)] 97.9 F (36.6 C) (01/17 0758) Pulse Rate:  [58-72] 68 (01/17 0800) Resp:  [6-16] 16 (01/17 0758) BP: (95-135)/(52-92) 95/52 mmHg (01/17 0945) SpO2:  [92 %-100 %] 98 % (01/17 0917) Weight:  [101.424 kg (223 lb 9.6 oz)-102.1 kg (225 lb 1.4 oz)] 102.1 kg (225 lb 1.4 oz) (01/17 0949) HEMODYNAMICS: CVP:  [8 mmHg-9 mmHg] 8 mmHg VENTILATOR SETTINGS:   INTAKE / OUTPUT: Intake/Output      01/16 0701 - 01/17 0700 01/17 0701 - 01/18 0700   P.O. 1080 240   Total Intake(mL/kg) 1080 (10.6) 240 (2.4)   Urine (mL/kg/hr) 1850 (0.8) 600 (1.6)   Stool 1 (0)    Total Output 1851 600   Net -771 -360        Urine Occurrence 1 x    Stool Occurrence 3 x      PHYSICAL EXAMINATION: Gen: Resting comfortably in chair HEENT: [place]/At, PERRL, No appreciable JVD PULM: Still with some wheeze on expiration CV: RRR. No murmur, rubs, or gallop.  GI: BS+, soft, nontender, nondistended Ext: trace edema, warm Neuro: A&Ox4, following commands  LABS:  CBC  Recent Labs Lab 11/29/15 0430 11/30/15 0524 12/01/15 0445  WBC 16.4* 14.7* 16.1*  HGB 8.5* 9.7* 10.1*  HCT 26.2* 31.8* 32.5*  PLT 330 333 371   Coag's  Recent Labs Lab 11/25/15 1300 11/25/15 1940 11/26/15 0400  APTT 61* 58* 60*   BMET  Recent Labs Lab 11/29/15 0430 11/30/15 0524 12/01/15 0445  NA 139 139 135  K 4.3 4.7 4.9  CL 96* 95* 94*  CO2 35* 34* 33*  BUN 76* 83* 94*  CREATININE 1.77* 1.77* 2.19*  GLUCOSE  163* 166* 201*   Electrolytes  Recent Labs Lab 11/29/15 0430 11/30/15 0524 12/01/15 0445  CALCIUM 9.0 9.3 8.7*   Sepsis Markers No results for input(s): LATICACIDVEN, PROCALCITON, O2SATVEN in the last 168 hours. ABG  Recent Labs Lab 11/24/15 1150 11/27/15 0344  PHART 7.291* 7.509*  PCO2ART 79.6* 54.0*  PO2ART 543.0* 63.0*   Liver Enzymes No results for input(s): AST, ALT, ALKPHOS, BILITOT, ALBUMIN in the last 168 hours. Cardiac Enzymes  Recent Labs Lab 11/24/15 1808 11/24/15 2300 11/25/15 0430  TROPONINI 0.28* 0.23* 0.22*   Glucose  Recent Labs Lab 11/30/15 0026 11/30/15 0726 11/30/15 1105 11/30/15 1616 11/30/15 2140 12/01/15 0732  GLUCAP 242* 143* 227* 301* 371* 209*    Imaging No results found.   CXR: Low lung volumes with vascular crowding and bibasilar atelectasis. Suspect small pleural effusions.   DISCUSSION: 55 yo wf with acute resp distress with a fib rvr, decompensated with increased WOB requiring intubation on 1/10. Now diuresing well, passing SBT.  ASSESSMENT / PLAN:  Acute hypoxemic respiratory failure, suspect due primarily to CHF in setting AF RVR Acute bronchitis > may have asthma but doesn't have COPD By PFT or CT chest -Continue supplemental O2 to keep SpO2 > 90% -Will likely need home O2 -Continue prednisone, tapering -Continue bronchodilators (symnicort, spiriva, PRN  albuterol) - Will give prn neb now to see if wheezes and congestion clear, if not may need to escalate to scheduled nebs -off abx as of 1/16, monitor -Will need pulmonary follow up as outpatient for repeat spirogram  CHF, acute decompensated A fib with RVR HTN -Cardiology primary -Amiodarone, Xarelto -Diuresis per cardiology, monitor UOP and renal fxn, BP  Georgann Housekeeper, AGACNP-BC [facility] Pulmonology/Critical Care Pager [phone] or [phone]  12/01/2015 10:36 AM

## 2015-12-01 NOTE — Progress Notes (Signed)
Pt FSBS 342.  Cecilie Kicks notified and orders received to wait one hour, re check it and let her know if it is greater than 300.  Will continue to closely monitor.

## 2015-12-01 NOTE — Progress Notes (Signed)
Patient ID: [name], female   DOB: [birth year], 55 y.o.   MRN: [id number]    Advanced Heart Failure Rounding Note   Subjective:    She was intubated 1/10 for hypoxemic respiratory failure and increased workup of breathing.  She converted back to NSR on amiodarone gtt and remains on amiodarone gtt.    1/12 renal dose dopamine added and she diuresed with IV lasix + metolazone.Extubated to 6 liters [place].  1/13 dopamine stopped. Transitioned to po lasix. Weight up 3 pounds.  1/14 lasix switched to po demadex   Switched to torsemide 11/28/15. Weight shows up 2 lbs despite negative output (even higher on recheck).  Creatinine bumped from 1.77 => 2.19. Coox stable 64.4 this am.   02 sat dropped into 70s overnight while sleeping. Will need home 02. CVP 6-7. Breathing better but still feels congested.  Willing to stay for several more days if needed. OK with home 02.   Studies:  Echo (11/24/15) showed EF 45% with inferolateral hypokinesis and mildly decreased RV systolic function.  This is similar to prior echo from 12/15.    Most recent [place] 10/31/2014: Large caliber vessel with 30% mid stenosis. The first obtuse marginal branch was a small caliber vessel (1.5 mm) with diffuse 80% stenosis in the proximal and mid segment. The second obtuse marginal branch is small in caliber with 40% mid stenosis. The left posterolateral branch has mild diffuse plaque.    Objective:   Weight Range:  Vital Signs:   Temp:  [97.7 F (36.5 C)-98.5 F (36.9 C)] 97.9 F (36.6 C) (01/17 0758) Pulse Rate:  [58-72] 68 (01/17 0800) Resp:  [6-16] 16 (01/17 0758) BP: (101-135)/(62-92) 101/83 mmHg (01/17 0800) SpO2:  [92 %-100 %] 98 % (01/17 0917) Weight:  [223 lb 9.6 oz (101.424 kg)] 223 lb 9.6 oz (101.424 kg) (01/17 0400) Last BM Date: 11/30/15  Weight change: Filed Weights   11/29/15 0510 11/30/15 0503 12/01/15 0400  Weight: 225 lb (102.059 kg) 221 lb 11.2 oz (100.562 kg) 223 lb 9.6 oz (101.424 kg)     Intake/Output:   Intake/Output Summary (Last 24 hours) at 12/01/15 0944 Last data filed at 12/01/15 0829  Gross per 24 hour  Intake   1080 ml  Output   2451 ml  Net  -1371 ml     Physical Exam: General:  NAD.    HEENT: normal Neck: Thick. JVP difficult to assess. CVP 6-7 Cor: PMI nondisplaced. Regular S1S2. No M/G/R noted Lungs: Decreased throughout, mild basilar crackles and scattered rhonchi. Mild expiratory wheezes. Abdomen:  Obese soft, NT, ND, no HSM. No bruits or masses. +BS  Extremities: no cyanosis, clubbing, rash. Tr ankle edema.  Neuro: Alert and Oriented x3.   Telemetry:  Reviewed personally, NSR 60-70s  Labs: Basic Metabolic Panel:  Recent Labs Lab 11/27/15 0445 11/28/15 0530 11/29/15 0430 11/30/15 0524 12/01/15 0445  NA 141 138 139 139 135  K 3.1* 4.4 4.3 4.7 4.9  CL 90* 93* 96* 95* 94*  CO2 40* 36* 35* 34* 33*  GLUCOSE 169* 135* 163* 166* 201*  BUN 57* 72* 76* 83* 94*  CREATININE 1.46* 1.79* 1.77* 1.77* 2.19*  CALCIUM 8.9 8.8* 9.0 9.3 8.7*    Liver Function Tests: No results for input(s): AST, ALT, ALKPHOS, BILITOT, PROT, ALBUMIN in the last 168 hours. No results for input(s): LIPASE, AMYLASE in the last 168 hours. No results for input(s): AMMONIA in the last 168 hours.  CBC:  Recent Labs Lab 11/27/15 0445 11/28/15  0530 11/29/15 0430 11/30/15 0524 12/01/15 0445  WBC 14.5* 13.6* 16.4* 14.7* 16.1*  NEUTROABS 9.8*  --   --   --   --   HGB 9.3* 9.1* 8.5* 9.7* 10.1*  HCT 30.1* 28.5* 26.2* 31.8* 32.5*  MCV 91.8 91.6 90.7 90.9 91.5  PLT 304 289 330 333 371    Cardiac Enzymes:  Recent Labs Lab 11/24/15 1808 11/24/15 2300 11/25/15 0430  TROPONINI 0.28* 0.23* 0.22*    BNP: BNP (last 3 results)  Recent Labs  10/15/15 1355 11/23/15 2234 12/01/15 0458  BNP 246.5* 523.5* 174.9*    ProBNP (last 3 results) No results for input(s): PROBNP in the last 8760 hours.    Other results:  Imaging: No results  found.   Medications:     Scheduled Medications: . amiodarone  400 mg Oral BID  . antiseptic oral rinse  7 mL Mouth Rinse q12n4p  . atorvastatin  40 mg Oral Daily  . budesonide-formoterol  2 puff Inhalation BID  . chlorhexidine  15 mL Mouth Rinse BID  . cholecalciferol  2,000 Units Oral Daily  . feeding supplement (GLUCERNA SHAKE)  237 mL Oral BID BM  . folic acid  1 mg Oral Daily  . gabapentin  300 mg Oral TID AC & HS  . insulin aspart  0-20 Units Subcutaneous TID WC & HS  . insulin aspart  10 Units Subcutaneous TID WC  . insulin glargine  70 Units Subcutaneous QHS  . levothyroxine  50 mcg Oral QAC breakfast  . losartan  25 mg Oral Daily  . pantoprazole  40 mg Oral Daily  . [START ON 12/02/2015] potassium chloride  20 mEq Oral Daily  . [START ON 12/02/2015] predniSONE  5 mg Oral Q breakfast  . rivaroxaban  15 mg Oral Q supper  . senna-docusate  1 tablet Per Tube BID  . tiotropium  18 mcg Inhalation Daily  . torsemide  40 mg Oral BID    Infusions:    PRN Medications: acetaminophen, acetaminophen-codeine, albuterol, ALPRAZolam, benzonatate, fentaNYL (SUBLIMAZE) injection, hydrALAZINE, menthol-cetylpyridinium, ondansetron (ZOFRAN) IV   Assessment/Plan :   1. Acute hypoxemic on chronic respiratory failure: Suspect combination of CHF and COPD exacerbation. Extubated. 1/12 to 6 liters. Now on 2L.  - Improved with diuresis nebs, steroids, and antibiotics.  - Need PFTs as outpatient. - Desats without [place] (into 70s with sleep). Will need home 02. Case management consult made.  2. Atrial fibrillation with RVR: History of PAF, on Xarelto at home.  - Remains in NSR in 60-70s.  - Continue po amio and Xarelto 3. Acute on chronic systolic CHF: [id number] on echo with inferolateral hypokinesis and mildly decreased RV systolic function.  She had a similar echo pattern in 12/15 and cardiac cath in 12/15 with nonobstructive and relatively mild CAD.  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  She briefly required norepinephrine, may have been due to sedation (dropped BP after intubation), now stable BP off pressors. She initially diuresed poorly, then low dose dopamine added.  Brisk diuresis noted with dopamine.   - Renal function bumped with uptrend in BUN. Negative 7.6 liters overall. Weight down 21 lbs overall. - Volume status stable. Creatinine and BUN trending up. Already received morning dose of torsemide. Will hold evening dose, may need to hold for tomorrow as well.  - Received morning Losartan 25 mg. Just started back 11/29/15. Will hold for now.  May need to decrease vs hold and restart as outpatient.  -  No bisoprolol (beta-1 selective) with recent low output and bad COPD.   4. COPD: Acute exacerbation.  - Nebs, steroids, abx per CCM.  - Extubated 1/12 to 6 liters. Now stable on 2L, de-sats off. 5. PAD: Extensive PAD history.  She had been on ASA, Plavix and Xarelto at home. Last peripheral PCI was in 2014.   - Now off ASA and Plavix with Xarelto. 6. AKI: - Creatinine bumped and K trending up.  - Changes as above.  - Continue to follow closely.  7. Anemia: Chronic.    - Hgb 9.7. Improved.  - Off Plavix and ASA.  - Continue xarelto 15 mg daily now with borderline/poor CrCl 8. Cirrhosis:  - ETOH-related per report.  9. CAD: Nonobstructive on 12/15 cath.   - TnI mildly elevated this admission, max 0.28 with minimal trend.  Suspect demand ischemia with hypoxemia and volume overload.  - No cath for now with AKI. - Echo wall motion pattern relatively unchanged from prior echo in 12/15.  10. Deconditioning:  - Continue PT. Current recommendation HHPT with 24 hr supervision.  - Will consult case management to help work for [facility] 02.   With bump in creatinine think could benefit from 24 more hours and labs in am.    Shirley Friar PA-C 12/01/2015 9:44 AM   Advanced Heart Failure Team Pager [phone] (M-F; 7a - 4p)  Please  contact [facility] for night-coverage after hours (4p -7a ) and weekends on [email or web address]  Patient seen with PA, agree with the above note.    BUN/creatinine higher today, CVP good at 6-7.  Good co-ox at 64%.  Stop losartan, and will hold torsemide for now.  She already got a dose of losartan and torsemide earlier today.   - Reassess renal function in the am, will eventually restart torsemide.  - Will stay off losartan.  - Eventually will get her back on bisoprolol but would like full resolution of COPD exacerbation.   Still has some wheezes/rhonchi on exam.  Steroid taper per pulmonary.  Finished levofloxacin.  Getting nebs. She will need home oxygen for COPD.    She is in NSR.  Continue Xarelto 15 daily and amiodarone, decrease amiodarone to 200 mg bid tomorrow.   Agree with monitori  Loralie Champagne 12/01/2015 12:33 PM

## 2015-12-01 NOTE — Progress Notes (Signed)
Glucose is remaining high, have adjusted meds. Will give 2200 dose earlier if needed.  Is on steroids.

## 2015-12-01 NOTE — Progress Notes (Signed)
Paged with [place] [phone]. Orders are for 20 units for up to 400 and to page MD for anything over.    Will give 20 Units. Pt has been getting 10 units for meal coverage.  Diabetes coordinator note recommend increase to 14 mg TID for meal coverage.    Will increase meal coverage as recommended.  Appreciate diabetes coordinator input.   Legrand Como [address] [place], PA-C 12/01/2015 4:32 PM

## 2015-12-02 ENCOUNTER — Telehealth: Payer: Self-pay

## 2015-12-02 ENCOUNTER — Encounter (HOSPITAL_COMMUNITY): Payer: Medicare Other

## 2015-12-02 DIAGNOSIS — I2489 Other forms of acute ischemic heart disease: Secondary | ICD-10-CM | POA: Insufficient documentation

## 2015-12-02 DIAGNOSIS — R7989 Other specified abnormal findings of blood chemistry: Secondary | ICD-10-CM | POA: Insufficient documentation

## 2015-12-02 DIAGNOSIS — R778 Other specified abnormalities of plasma proteins: Secondary | ICD-10-CM | POA: Insufficient documentation

## 2015-12-02 LAB — BASIC METABOLIC PANEL
ANION GAP: 7 (ref 5–15)
BUN: 85 mg/dL — ABNORMAL HIGH (ref 6–20)
CHLORIDE: 96 mmol/L — AB (ref 101–111)
CO2: 35 mmol/L — AB (ref 22–32)
Calcium: 8.9 mg/dL (ref 8.9–10.3)
Creatinine, Ser: 1.86 mg/dL — ABNORMAL HIGH (ref 0.44–1.00)
GFR calc Af Amer: 35 mL/min — ABNORMAL LOW (ref >60)
GFR calc non Af Amer: 30 mL/min — ABNORMAL LOW (ref >60)
GLUCOSE: 117 mg/dL — AB (ref 65–99)
POTASSIUM: 4.6 mmol/L (ref 3.5–5.1)
Sodium: 138 mmol/L (ref 135–145)

## 2015-12-02 LAB — GLUCOSE, CAPILLARY
GLUCOSE-CAPILLARY: 145 mg/dL — AB (ref 65–99)
Glucose-Capillary: 126 mg/dL — ABNORMAL HIGH (ref 65–99)
Glucose-Capillary: 186 mg/dL — ABNORMAL HIGH (ref 65–99)
Glucose-Capillary: 181 mg/dL — ABNORMAL HIGH (ref 65–99)
Glucose-Capillary: 318 mg/dL — ABNORMAL HIGH (ref 65–99)

## 2015-12-02 LAB — CARBOXYHEMOGLOBIN
CARBOXYHEMOGLOBIN: 1.6 % — AB (ref 0.5–1.5)
METHEMOGLOBIN: 0.7 % (ref 0.0–1.5)
O2 Saturation: 62.5 %
Total hemoglobin: 9.9 g/dL — ABNORMAL LOW (ref 12.0–16.0)

## 2015-12-02 MED ORDER — LOSARTAN POTASSIUM 25 MG PO TABS
12.5000 mg | ORAL_TABLET | Freq: Every day | ORAL | Status: DC
  Administered 2015-12-02: 12.5 mg via ORAL
  Filled 2015-12-02: qty 1

## 2015-12-02 MED ORDER — BISOPROLOL FUMARATE 5 MG PO TABS
2.5000 mg | ORAL_TABLET | Freq: Every day | ORAL | Status: DC
  Administered 2015-12-02: 2.5 mg via ORAL
  Filled 2015-12-02: qty 1

## 2015-12-02 MED ORDER — AMIODARONE HCL 200 MG PO TABS
200.0000 mg | ORAL_TABLET | Freq: Two times a day (BID) | ORAL | Status: DC
  Administered 2015-12-02 – 2015-12-03 (×2): 200 mg via ORAL
  Filled 2015-12-02 (×2): qty 1

## 2015-12-02 NOTE — Telephone Encounter (Signed)
-----   Message from Juanito Doom, MD sent at 11/27/2015 11:42 AM EST ----- Hi,  Needs f/u with me or TP for asthma in 4-6 weeks  Thanks B

## 2015-12-02 NOTE — Progress Notes (Signed)
Inpatient Diabetes Program Recommendations  AACE/ADA: New Consensus Statement on Inpatient Glycemic Control (2015)  Target Ranges:  Prepandial:   less than 140 mg/dL      Peak postprandial:   less than 180 mg/dL (1-2 hours)      Critically ill patients:  140 - 180 mg/dL   Review of Glycemic Control  Results for [name], [name] (MRN [id number]) as of 12/02/2015 16:32  Ref. Range 12/01/2015 18:48 12/01/2015 21:11 12/02/2015 02:59 12/02/2015 07:31 12/02/2015 11:16  Glucose-Capillary Latest Ref Range: 65-99 mg/dL 369 (H) 382 (H) 126 (H) 186 (H) 181 (H)   Blood sugars acceptable with Novolog 14 units tidwc. Steroids tapering. Agree with current orders.  Will f/u in am. Thank you. Lorenda Peck, RD, LDN, CDE Inpatient Diabetes Coordinator [phone]

## 2015-12-02 NOTE — Progress Notes (Signed)
PULMONARY / CRITICAL CARE MEDICINE   Name: [name] MRN: [id number] DOB: 1961/07/04    HISTORY OF PRESENT ILLNESS:  55 yo wf with acute resp distress with a fib rvr, decompensated with increased WOB requiring intubation on 1/10. Diuresed, BDs, steroids. Extubated 1/12.   SUBJECTIVE: Feels better today. Wheezing is better.   VITAL SIGNS: Temp:  [97.6 F (36.4 C)-98.5 F (36.9 C)] 97.6 F (36.4 C) (01/18 0733) Pulse Rate:  [61-73] 70 (01/18 0243) Resp:  [11-19] 18 (01/18 0346) BP: (112-135)/(50-68) 135/68 mmHg (01/18 0346) SpO2:  [97 %-100 %] 98 % (01/18 0346) Weight:  [224 lb 4.8 oz (101.742 kg)] 224 lb 4.8 oz (101.742 kg) (01/18 0244) HEMODYNAMICS: CVP:  [8 mmHg] 8 mmHg VENTILATOR SETTINGS:   INTAKE / OUTPUT: Intake/Output      01/17 0701 - 01/18 0700 01/18 0701 - 01/19 0700   P.O. 1080 660   Total Intake(mL/kg) 1080 (10.6) 660 (6.5)   Urine (mL/kg/hr) 3070 (1.3) 600 (1.1)   Stool     Total Output 3070 600   Net -1990 +60        Urine Occurrence 3 x      PHYSICAL EXAMINATION: Gen: No distress HEENT: Moist mucus membranes PULM: Clear, no wheeze or crackles.  CV: RRR. No MRG GI: BS+, soft, nontender, nondistended Ext: No edema Neuro: No focal deficits  LABS:  CBC  Recent Labs Lab 11/29/15 0430 11/30/15 0524 12/01/15 0445  WBC 16.4* 14.7* 16.1*  HGB 8.5* 9.7* 10.1*  HCT 26.2* 31.8* 32.5*  PLT 330 333 371   Coag's  Recent Labs Lab 11/25/15 1300 11/25/15 1940 11/26/15 0400  APTT 61* 58* 60*   BMET  Recent Labs Lab 11/30/15 0524 12/01/15 0445 12/02/15 0300  NA 139 135 138  K 4.7 4.9 4.6  CL 95* 94* 96*  CO2 34* 33* 35*  BUN 83* 94* 85*  CREATININE 1.77* 2.19* 1.86*  GLUCOSE 166* 201* 117*   Electrolytes  Recent Labs Lab 11/30/15 0524 12/01/15 0445 12/02/15 0300  CALCIUM 9.3 8.7* 8.9   Sepsis Markers No results for input(s): LATICACIDVEN, PROCALCITON, O2SATVEN in the last 168 hours. ABG  Recent Labs Lab 11/27/15 0344   PHART 7.509*  PCO2ART 54.0*  PO2ART 63.0*   Liver Enzymes No results for input(s): AST, ALT, ALKPHOS, BILITOT, ALBUMIN in the last 168 hours. Cardiac Enzymes No results for input(s): TROPONINI, PROBNP in the last 168 hours. Glucose  Recent Labs Lab 12/01/15 1742 12/01/15 1848 12/01/15 2111 12/02/15 0259 12/02/15 0731 12/02/15 1116  GLUCAP 342* 369* 382* 126* 186* 181*    Imaging No results found.   CXR: Low lung volumes with vascular crowding and bibasilar atelectasis. Suspect small pleural effusions.   DISCUSSION: 55 yo wf with acute resp distress with a fib rvr, decompensated with increased WOB requiring intubation on 1/10.   ASSESSMENT / PLAN:  Acute hypoxemic respiratory failure, suspect due primarily to CHF in setting AF RVR Acute bronchitis > may have asthma but doesn't have COPD By PFT or CT chest -Continue supplemental O2 to keep SpO2 > 90% -Will likely need home O2 -Continue prednisone, tapering -Continue bronchodilators (symnicort, spiriva, PRN albuterol) -off abx as of 1/16, monitor -Will need pulmonary follow up as outpatient for repeat spirogram  CHF, acute decompensated A fib with RVR HTN -Cardiology primary -Amiodarone, Xarelto -Diuresis per cardiology, monitor UOP and renal fxn, BP  Karla Oaxaca MD [facility] Pager [phone] If no answer or after 3pm call:  [phone] 12/02/2015, 12:21 PM

## 2015-12-02 NOTE — Telephone Encounter (Signed)
LVM for patient to return call. 

## 2015-12-02 NOTE — Progress Notes (Signed)
Patient ID: [name], female   DOB: 1961-07-24, 55 y.o.   MRN: [id number]    Advanced Heart Failure Rounding Note   Subjective:    She was intubated 1/10 for hypoxemic respiratory failure and increased workup of breathing.  She converted back to NSR on amiodarone gtt and remains on amiodarone gtt.    1/12 renal dose dopamine added and she diuresed with IV lasix + metolazone.Extubated to 6 liters [place].  1/13 dopamine stopped. Transitioned to po lasix. Weight up 3 pounds.  1/14 lasix switched to po demadex  Yesterday torsemide and losartan held due to elevated creatinine. Overall feeling ok. Ongoing dyspnea with exertion but this is her baseline. Not walking much.    Studies: Echo (11/24/15) showed EF 45% with inferolateral hypokinesis and mildly decreased RV systolic function.  This is similar to prior echo from 12/15.    Most recent [place] 10/31/2014: Large caliber vessel with 30% mid stenosis. The first obtuse marginal branch was a small caliber vessel (1.5 mm) with diffuse 80% stenosis in the proximal and mid segment. The second obtuse marginal branch is small in caliber with 40% mid stenosis. The left posterolateral branch has mild diffuse plaque.    Objective:   Weight Range:  Vital Signs:   Temp:  [97.6 F (36.4 C)-98.5 F (36.9 C)] 97.6 F (36.4 C) (01/18 0733) Pulse Rate:  [61-73] 70 (01/18 0243) Resp:  [11-20] 18 (01/18 0346) BP: (95-139)/(50-69) 135/68 mmHg (01/18 0346) SpO2:  [97 %-100 %] 98 % (01/18 0346) Weight:  [224 lb 4.8 oz (101.742 kg)-225 lb 1.4 oz (102.1 kg)] 224 lb 4.8 oz (101.742 kg) (01/18 0244) Last BM Date: 12/01/15  Weight change: Filed Weights   12/01/15 0400 12/01/15 0949 12/02/15 0244  Weight: 223 lb 9.6 oz (101.424 kg) 225 lb 1.4 oz (102.1 kg) 224 lb 4.8 oz (101.742 kg)    Intake/Output:   Intake/Output Summary (Last 24 hours) at 12/02/15 0854 Last data filed at 12/02/15 0847  Gross per 24 hour  Intake   1020 ml  Output   2770 ml  Net   -1750 ml     Physical Exam: CVP 4  General:  NAD.   Sitting in the chair HEENT: normal Neck: Thick. JVP difficult to assess. CVP flat Cor: PMI nondisplaced. Regular S1S2. No M/G/R noted Lungs: Decreased throughout, mild basilar crackles and scattered rhonchi. Mild expiratory wheezes. Abdomen:  Obese soft, NT, ND, no HSM. No bruits or masses. +BS  Extremities: no cyanosis, clubbing, rash. Tr ankle edema.  Neuro: Alert and Oriented x3.   Telemetry:  Reviewed personally, NSR 60-70s  Labs: Basic Metabolic Panel:  Recent Labs Lab 11/28/15 0530 11/29/15 0430 11/30/15 0524 12/01/15 0445 12/02/15 0300  NA 138 139 139 135 138  K 4.4 4.3 4.7 4.9 4.6  CL 93* 96* 95* 94* 96*  CO2 36* 35* 34* 33* 35*  GLUCOSE 135* 163* 166* 201* 117*  BUN 72* 76* 83* 94* 85*  CREATININE 1.79* 1.77* 1.77* 2.19* 1.86*  CALCIUM 8.8* 9.0 9.3 8.7* 8.9    Liver Function Tests: No results for input(s): AST, ALT, ALKPHOS, BILITOT, PROT, ALBUMIN in the last 168 hours. No results for input(s): LIPASE, AMYLASE in the last 168 hours. No results for input(s): AMMONIA in the last 168 hours.  CBC:  Recent Labs Lab 11/27/15 0445 11/28/15 0530 11/29/15 0430 11/30/15 0524 12/01/15 0445  WBC 14.5* 13.6* 16.4* 14.7* 16.1*  NEUTROABS 9.8*  --   --   --   --  HGB 9.3* 9.1* 8.5* 9.7* 10.1*  HCT 30.1* 28.5* 26.2* 31.8* 32.5*  MCV 91.8 91.6 90.7 90.9 91.5  PLT 304 289 330 333 371    Cardiac Enzymes: No results for input(s): CKTOTAL, CKMB, CKMBINDEX, TROPONINI in the last 168 hours.  BNP: BNP (last 3 results)  Recent Labs  10/15/15 1355 11/23/15 2234 12/01/15 0458  BNP 246.5* 523.5* 174.9*    ProBNP (last 3 results) No results for input(s): PROBNP in the last 8760 hours.    Other results:  Imaging: No results found.   Medications:     Scheduled Medications: . amiodarone  400 mg Oral BID  . antiseptic oral rinse  7 mL Mouth Rinse q12n4p  . atorvastatin  40 mg Oral Daily  .  budesonide-formoterol  2 puff Inhalation BID  . chlorhexidine  15 mL Mouth Rinse BID  . cholecalciferol  2,000 Units Oral Daily  . feeding supplement (GLUCERNA SHAKE)  237 mL Oral BID BM  . folic acid  1 mg Oral Daily  . gabapentin  300 mg Oral TID AC & HS  . insulin aspart  0-20 Units Subcutaneous TID WC & HS  . insulin aspart  14 Units Subcutaneous TID WC  . insulin glargine  70 Units Subcutaneous QHS  . levothyroxine  50 mcg Oral QAC breakfast  . pantoprazole  40 mg Oral Daily  . rivaroxaban  15 mg Oral Q supper  . senna-docusate  1 tablet Per Tube BID  . tiotropium  18 mcg Inhalation Daily    Infusions:    PRN Medications: acetaminophen, acetaminophen-codeine, albuterol, ALPRAZolam, benzonatate, fentaNYL (SUBLIMAZE) injection, hydrALAZINE, menthol-cetylpyridinium, ondansetron (ZOFRAN) IV   Assessment/Plan :   1. Acute hypoxemic on chronic respiratory failure: Suspect combination of CHF and COPD exacerbation. Extubated. 1/12 to 6 liters. Now on 2L.  - Improved with diuresis nebs, steroids, and antibiotics.  - Need PFTs as outpatient. - Desats without [place] (into 70s with sleep). Will need home 02. Case management consult made.  -She will need pulmonary follow up.  2. Atrial fibrillation with RVR: History of PAF, on Xarelto at home.  - Remains in NSR in 60-70s.  - Cut back amio to 200 mg twice a day. With ongoing lung issues may need to stop.  3. Acute on chronic systolic CHF: [id number] on echo with inferolateral hypokinesis and mildly decreased RV systolic function.  She had a similar echo pattern in 12/15 and cardiac cath in 12/15 with nonobstructive and relatively mild CAD.  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  She briefly required norepinephrine, may have been due to sedation (dropped BP after intubation), now stable BP off pressors. She initially diuresed poorly, then low dose dopamine added.  Brisk diuresis noted with dopamine.   - Renal function  bumped with uptrend in BUN. Negative 7.6 liters overall. Weight down 21 lbs overall. - Volume status stable. CVP 4. Hold diuretics today.  - Add 12.5 mg losartan.  -Start 2.5 mg bisoprolol.  4. COPD: Acute exacerbation.  - Nebs, steroids, abx per CCM.  - Extubated 1/12 to 6 liters. Now stable on 2L, de-sats off. 5. PAD: Extensive PAD history.  She had been on ASA, Plavix and Xarelto at home. Last peripheral PCI was in 2014.   - Now off ASA and Plavix with Xarelto. 6. AKI: - Creatinine back down from 2.1> 1.86  - Continue to follow closely.  7. Anemia: Chronic.    - Hgb 10.1   - Off Plavix  and ASA.  - Continue xarelto 15 mg daily now with borderline/poor CrCl 8. Cirrhosis:  - ETOH-related per report.  9. CAD: Nonobstructive on 12/15 cath.   - TnI mildly elevated this admission, max 0.28 with minimal trend.  Suspect demand ischemia with hypoxemia and volume overload.   - No cath for now with AKI. - Echo wall motion pattern relatively unchanged from prior echo in 12/15.  10. Deconditioning: - Continue PT. Current recommendation HHPT with 24 hr supervision.  - Will consult case management to help work for [facility] 02.  11. Uncontrolled DM- Diabetes Coordinator input appreciated. Meal coverage increased to Novolog  14 units TID. Continue lantus 70 units qhs, and resistant sliding scale coverage.   [facility] to follow for HHRN HHPT. Home oxygen set up.   Social- Lives in [place] with her siser. Gets food stamps. High risk for admits due to poor insight. She will need close follow up in the HF clinic.   [name] Clegg NP-C  12/02/2015 8:54 AM   Advanced Heart Failure Team Pager [phone] (M-F; 7a - 4p)  Please contact [facility] for night-coverage after hours (4p -7a ) and weekends on [email or web address]  Patient seen with NP, agree with the above note.  CVP 4 today, creatinine down to 1.8 but BUN still high.  - Got torsemide yesterday, hold today.  Probably restart 40 mg daily tomorrow.  - Would  keep off losartan as BUN/creatinine remain elevated.  - Add back bisoprolol as COPD exacerbation has improved.   She will need home oxygen for COPD, this will be arranged.   She remains in NSR, cut back on amiodarone to 200 mg bid.  Will titrate this down to lowest dose possible to keep in NSR given COPD, would try to get to 100 mg daily eventually.  She does not tolerate atrial fibrillation well with her CHF.   She may be able to go home tomorrow with home health and home O2.  Will need close followup.    Loralie Champagne 12/02/2015

## 2015-12-02 NOTE — Progress Notes (Signed)
Physical Therapy Treatment Patient Details Name: [name] MRN: [id number] DOB: 1961-05-11 Today's Date: 12/02/2015    History of Present Illness Patient is a 55 y/o female with hx of neuropathies, polysubstance, HF, COPD and DM presents with worsening sob in setting of increased sputum production and cough likely due to combined decompensated systolic heart failure and COPD exacerbation. She continues to have hypoxia with activity     PT Comments    Pt progressing w/ mobility but remains limited by fatigue after ambulating 125 ft.  Pt will benefit from continued skilled PT services to increase functional independence and safety.  Follow Up Recommendations  Home health PT;Supervision/Assistance - 24 hour     Equipment Recommendations  Rolling walker with 5" wheels;Wheelchair (measurements PT);Wheelchair cushion (measurements PT)    Recommendations for Other Services OT consult     Precautions / Restrictions Precautions Precautions: Fall Precaution Comments: watch O2 Restrictions Weight Bearing Restrictions: No    Mobility  Bed Mobility               General bed mobility comments: sitting up in chair upon arrival   Transfers Overall transfer level: Modified independent Equipment used: Rolling walker (2 wheeled) Transfers: Sit to/from Stand Sit to Stand: Modified independent (Device/Increase time)         General transfer comment: Safe technique and no physical assist or cues needed.  Ambulation/Gait Ambulation/Gait assistance: Supervision;+2 safety/equipment Ambulation Distance (Feet): 125 Feet Assistive device: Rolling walker (2 wheeled) Gait Pattern/deviations: Step-through pattern;Decreased stride length;Trunk flexed;Antalgic   Gait velocity interpretation: Below normal speed for age/gender General Gait Details: Cues for upright posture and technique using RW which improves her c/o back pain.  Pt fatigues after ambulating 125 ft.   Stairs            Wheelchair Mobility    Modified Rankin (Stroke Patients Only)       Balance Overall balance assessment: Needs assistance Sitting-balance support: No upper extremity supported;Feet supported Sitting balance-Leahy Scale: Normal     Standing balance support: During functional activity Standing balance-Leahy Scale: Fair                      Cognition Arousal/Alertness: Awake/alert Behavior During Therapy: WFL for tasks assessed/performed Overall Cognitive Status: Within Functional Limits for tasks assessed                      Exercises General Exercises - Lower Extremity Ankle Circles/Pumps: AROM;Both;10 reps;Seated Long Arc Quad: AROM;Both;10 reps;Seated Hip Flexion/Marching: AROM;Both;10 reps;Seated    General Comments        Pertinent Vitals/Pain Pain Assessment: 0-10 Pain Score: 10-Worst pain ever Pain Location: Bil LEs and back (w/ ambulation) Pain Descriptors / Indicators: Aching Pain Intervention(s): Limited activity within patient's tolerance;Monitored during session    Home Living                      Prior Function            PT Goals (current goals can now be found in the care plan section) Acute Rehab PT Goals Patient Stated Goal: to go for a walk PT Goal Formulation: With patient Time For Goal Achievement: 12/11/15 Potential to Achieve Goals: Good Progress towards PT goals: Progressing toward goals    Frequency  Min 3X/week    PT Plan Current plan remains appropriate    Co-evaluation             End of  Session Equipment Utilized During Treatment: Oxygen;Gait belt Activity Tolerance: Patient limited by fatigue Patient left: in chair;with call bell/phone within reach     Time: 1135-1146 PT Time Calculation (min) (ACUTE ONLY): 11 min  Charges:  $Gait Training: 8-22 mins                    G Codes:      Joslyn Hy PT, [place] [id number] Pager: [phone] 12/02/2015, 3:25 PM

## 2015-12-03 LAB — BASIC METABOLIC PANEL
Anion gap: 8 (ref 5–15)
BUN: 70 mg/dL — ABNORMAL HIGH (ref 6–20)
CALCIUM: 9 mg/dL (ref 8.9–10.3)
CO2: 32 mmol/L (ref 22–32)
CREATININE: 1.4 mg/dL — AB (ref 0.44–1.00)
Chloride: 99 mmol/L — ABNORMAL LOW (ref 101–111)
GFR calc Af Amer: 49 mL/min — ABNORMAL LOW (ref >60)
GFR calc non Af Amer: 42 mL/min — ABNORMAL LOW (ref >60)
GLUCOSE: 126 mg/dL — AB (ref 65–99)
Potassium: 5.2 mmol/L — ABNORMAL HIGH (ref 3.5–5.1)
Sodium: 139 mmol/L (ref 135–145)

## 2015-12-03 LAB — GLUCOSE, CAPILLARY
GLUCOSE-CAPILLARY: 179 mg/dL — AB (ref 65–99)
Glucose-Capillary: 108 mg/dL — ABNORMAL HIGH (ref 65–99)
Glucose-Capillary: 155 mg/dL — ABNORMAL HIGH (ref 65–99)

## 2015-12-03 LAB — CARBOXYHEMOGLOBIN
CARBOXYHEMOGLOBIN: 1.2 % (ref 0.5–1.5)
Methemoglobin: 0.9 % (ref 0.0–1.5)
O2 SAT: 51.9 %
Total hemoglobin: 9.6 g/dL — ABNORMAL LOW (ref 12.0–16.0)

## 2015-12-03 MED ORDER — BUDESONIDE-FORMOTEROL FUMARATE 160-4.5 MCG/ACT IN AERO: 2.0000 | INHALATION_SPRAY | Freq: Two times a day (BID) | RESPIRATORY_TRACT | Status: DC

## 2015-12-03 MED ORDER — INSULIN ASPART 100 UNIT/ML FLEXPEN: 6.0000 [IU] | PEN_INJECTOR | Freq: Three times a day (TID) | SUBCUTANEOUS | Status: DC

## 2015-12-03 MED ORDER — TORSEMIDE 20 MG PO TABS: 40.0000 mg | ORAL_TABLET | Freq: Every day | ORAL | Status: DC

## 2015-12-03 MED ORDER — INSULIN GLARGINE 100 UNITS/ML SOLOSTAR PEN: 70.0000 [IU] | PEN_INJECTOR | Freq: Every day | SUBCUTANEOUS | Status: DC

## 2015-12-03 MED ORDER — AMIODARONE HCL 200 MG PO TABS: 200.0000 mg | ORAL_TABLET | Freq: Two times a day (BID) | ORAL | Status: DC

## 2015-12-03 MED ORDER — TIOTROPIUM BROMIDE MONOHYDRATE 18 MCG IN CAPS: 18.0000 ug | ORAL_CAPSULE | Freq: Every day | RESPIRATORY_TRACT | Status: DC

## 2015-12-03 MED ORDER — RIVAROXABAN 20 MG PO TABS
20.0000 mg | ORAL_TABLET | Freq: Every day | ORAL | Status: DC
  Administered 2015-12-03: 20 mg via ORAL
  Filled 2015-12-03: qty 1

## 2015-12-03 MED ORDER — INSULIN ASPART 100 UNIT/ML FLEXPEN: 14.0000 [IU] | PEN_INJECTOR | Freq: Three times a day (TID) | SUBCUTANEOUS | Status: DC

## 2015-12-03 MED ORDER — GABAPENTIN (ONCE-DAILY) 300 MG PO TABS: 300.0000 mg | ORAL_TABLET | Freq: Three times a day (TID) | ORAL | Status: DC

## 2015-12-03 MED ORDER — TORSEMIDE 20 MG PO TABS
40.0000 mg | ORAL_TABLET | Freq: Every day | ORAL | Status: DC
  Administered 2015-12-03: 40 mg via ORAL
  Filled 2015-12-03: qty 2

## 2015-12-03 NOTE — Consult Note (Signed)
   [facility] CM Inpatient Consult   12/03/2015  [name] 11/04/1961 [id number] Referral received for care management services.  Patient evaluated for [place] [facility] services.  Thank you for this consult.   This patient is currently Not eligible for [facility] Care Management Services.   Reason:  Not a beneficiary currently attributed to one of the [place].  Membership roster was used to verify non- eligible status. Will update inpatient RNCM.  For questions, please contact: Natividad Brood, RN BSN [facility] Liaison  [phone] business mobile phone Toll free office [phone]

## 2015-12-03 NOTE — Progress Notes (Signed)
SATURATION QUALIFICATIONS: (This note is used to comply with regulatory documentation for home oxygen)  Patient Saturations on Room Air at Rest = 91%  Patient Saturations on Room Air while Ambulating = 85%  Patient Saturations on 2 Liters of oxygen while Ambulating = 94%  Please briefly explain why patient needs home oxygen: Pt's oxygen level dropped to 85% on RA with ambulation

## 2015-12-03 NOTE — Care Management Important Message (Signed)
Important Message  Patient Details  Name: [name] MRN: [id number] Date of Birth: 03-29-1961   Medicare Important Message Given:  Yes    Bethena Roys, RN 12/03/2015, 11:29 AM

## 2015-12-03 NOTE — Discharge Summary (Signed)
Advanced Heart Failure Team  Discharge Summary   Patient ID: [name] MRN: [id number], DOB/AGE: 54-Oct-1962 54 y.o. Admit date: 11/23/2015 D/C date:     12/03/2015   Primary Discharge Diagnoses:  1. Acute hypoxemic on chronic respiratory failure requiring intubation- thought 2/2 mixed CHF/COPD. Resolved. Has pulm follow up. 2. Atrial fibrillation with RVR: History of PAF, on Xarelto at home. - Improved. Now on amio for same.  3. Acute on chronic systolic CHF: EF 26% on echo with inferolateral hypokinesis and mildly decreased RV systolic function. - Improved. Close HF follow up.  4. COPD: Acute exacerbation. - Improved. Has pulm follow up.  5. PAD: - Off ASA and plavix, Now only on Xarelto.  6. AKI on CKD stage III - Improved. Follow with labs next week.  7. Anemia: Chronic.Stable. Off plavix and ASA. On Xarelto.  8. Cirrhosis: - ETOH-related per report.  9. CAD: - Nonobstructive on 12/15 by cath. Stable. Troponins mildly up this admit, but with AKI no cath. Will reconsider with symptoms.  10. Deconditioning: - HHRN, PT, Home 02.  11. Uncontrolled DM- Diabetes Coordinator input appreciated. Needs to make PCP follow up.  Consults: CCM, Internal Medicine, Diabetes coordinator, PT, Case Manager.   Hospital Course:   [name] is a 55 y.o. female with history of chronic combined HF Echo 11/24/15 LVEF 45% and grade 2 DD, HTN, DM2, HLD, tobacco abuse, PAD, PAF, carotid disease, and Non obstructive CAD admitted early morning 11/23/15 with 2 days worsening SOB and increased sputum production/cough. Found to be in Afib RVR later that morning and HF team paged. Received stat IV lasix and amio bolus with drip.  With markedly increased work of breathing, placed on Bipap and without improvement, was intubated. Problem based hospital course as follows.  1. Acute hypoxemic on chronic respiratory failure:  - Intubated 1/10 with hypoxemia and increased work of breathing. She required norepinephrine  overnight for a short period, but was able to be weaned off quickly, after she returned to NSR from afib on amio as below. This was thought to be a combination of CHF and COPD exacerbation. She was treated for her COPD with levaquin, steroid taper and nebs, and IV diuresis for her CHF as below. Suspect combination of CHF and COPD exacerbation. Extubated 11/26/15 and weaned from 6 L 02 to RA. Desat with ambulation as below, added home 02. Sees pulmonary 2/20 for PFTs and MD visit same day. Appreciate pulmonary assistance in obtaining this.   2. Atrial fibrillation with RVR: History of PAF, on Xarelto at home -She had rates as high as 150 bpm this admission. She was given an amio bolus and placed on continuous drip, on which she spontaneously converted, and remained, in NSR. Rates improved into 60-70s even with transition to po amio. Amio decreased down to 200 mg BID. We will cut back to 200 mg daily at her follow up visit, and likely down to 100 mg mg daily shortly after that with her ongoing lung issues to preemptively protect against amiodarone lung toxicity. Her rate eventually dropped into the 40-50s where upon Coreg was stopped. Rate remained stable in the 50s on discharge.  This patients CHA2DS2-VASc is 5.    3. Acute on chronic systolic CHF: Echo 3/78/54 with EF 45% with inferolateral hypokinesis and mildly decreased RV systolic function. She had a similar echo pattern in 12/15 and cardiac cath in 12/15 with nonobstructive and relatively mild CAD. She briefly required norepinephrine, which may have been due  to sedation (dropped BP after intubation). Was able to be weaned off. It was noted her CVP was 19-20 with initial poor response to lasix and metolazone. Renal dose dopamine was added 11/26/15 which greatly improved diuresis. BB was held with low BP and concerns for low ouput. Creatinine trended up with diuresis to a max of 2.19, but improved to 1.4 after several days of holding diuresis.  Losartan held  and not added back on with AKI.  Switched from lasix to torsemide for home.   4. COPD, Acute exacerbation.  - Contributed to #1. Treated with nebs, a prednisone taper, and per CCM. Will go home on 02.   5. PAD: She has an extensive PAD history.Had been on ASA, Plavix and Xarelto at home. All held with anemia. Bridged with heparin. It was decided to stop ASA and plavix, as she was otherwise stable on chronic Xarelto.   6. AKI on CKD stage III -  - Creatinine rose to a max of 2.1 with aggressive diuresis.  Improved to 1.4 with several days of holding diuretics.  Place on torsemide for home. Will follow with labs via [facility] next week.   7. Anemia, of chronic disease. - Hgb dropped as low as 8.0 this admission. Plavix, Xarelto, and ASA held.  Xarelto added back once stabilized. Will keep off plavix and ASA. Per pharmacy, with improved CrCl xarelto 20 mg daily. Calculated for ACTUAL body weight with her obesity.   8. Cirrhosis: - ETOH-related per report. Will have follow up PCP.   9. CAD: Noted to nonobstructive on 12/15 cath. TnI mildly elevated this admission, max 0.28 with minimal trend. Suspect demand ischemia with hypoxemia and volume overload.No cath for now with AKI. May need to do stress test if pt has ACS like symptoms. Will continue to follow with general cards in addition to HF clinic. Of note, Echo wall motion pattern relatively unchanged from prior echo in 12/15.   10. Uncontrolled DM- Diabetes Coordinator input appreciated. Changes made accordingly and carried over to outpatient orders. Placed back on glipizide on discharge. Metformin held with AKI. Recommended she see PCP ASAP for reconciliation and further adjustment.    Overall she diuresed 7 L and 16 lbs from highest accurate weight this admission. She will be discharged home in stable condition with close follow up in the HF clinic and f/u with pulm as below.  Also recommended she follow up with her PCP ASAP.  She has been  referred to the paramedicine program, and will receive HHRN and PT via [facility].  Now on home 02. DME provided for home 02, wheelchair (with cushion) and rolling walker.   Discharge Weight Range: 226 lbs Discharge Vitals: Blood pressure 129/55, pulse 57, temperature 98 F (36.7 C), temperature source Oral, resp. rate 15, height '5\' 4"'  (1.626 m), weight 226 lb 1.6 oz (102.558 kg), last menstrual period 11/27/2012, SpO2 97 %.  Labs: Lab Results  Component Value Date   WBC 16.1* 12/01/2015   HGB 10.1* 12/01/2015   HCT 32.5* 12/01/2015   MCV 91.5 12/01/2015   PLT 371 12/01/2015     Recent Labs Lab 12/03/15 0630  NA 139  K 5.2*  CL 99*  CO2 32  BUN 70*  CREATININE 1.40*  CALCIUM 9.0  GLUCOSE 126*   Lab Results  Component Value Date   CHOL 160 10/16/2015   HDL 35* 10/16/2015   LDLCALC 76 10/16/2015   TRIG 246* 10/16/2015   BNP (last 3 results)  Recent Labs  10/15/15 1355 11/23/15 2234 12/01/15 0458  BNP 246.5* 523.5* 174.9*    ProBNP (last 3 results) No results for input(s): PROBNP in the last 8760 hours.   Diagnostic Studies/Procedures   No results found.  Discharge Medications     Medication List    STOP taking these medications        aspirin EC 81 MG tablet     carvedilol 12.5 MG tablet  Commonly known as:  COREG     clopidogrel 75 MG tablet  Commonly known as:  PLAVIX     diltiazem 180 MG 24 hr capsule  Commonly known as:  CARDIZEM CD     furosemide 40 MG tablet  Commonly known as:  LASIX     lisinopril 20 MG tablet  Commonly known as:  PRINIVIL,ZESTRIL     metFORMIN 500 MG tablet  Commonly known as:  GLUCOPHAGE      TAKE these medications        ACCU-CHEK AVIVA PLUS w/Device Kit  1 Device by Does not apply route 4 (four) times daily -  before meals and at bedtime.     accu-chek soft touch lancets  Use as instructed     acetaminophen 500 MG tablet  Commonly known as:  TYLENOL  Take 1,000 mg by mouth every 6 (six) hours as needed  (pain).     acetaminophen-codeine 300-30 MG tablet  Commonly known as:  TYLENOL #3  Take 1 tablet by mouth every 4 (four) hours as needed for moderate pain.     albuterol (2.5 MG/3ML) 0.083% nebulizer solution  Commonly known as:  PROVENTIL  Take 3 mLs (2.5 mg total) by nebulization every 6 (six) hours as needed. For wheezing.     albuterol 108 (90 Base) MCG/ACT inhaler  Commonly known as:  PROVENTIL HFA;VENTOLIN HFA  Inhale 2 puffs into the lungs every 6 (six) hours as needed for wheezing or shortness of breath.     amiodarone 200 MG tablet  Commonly known as:  PACERONE  Take 1 tablet (200 mg total) by mouth 2 (two) times daily.     atorvastatin 40 MG tablet  Commonly known as:  LIPITOR  Take 1 tablet (40 mg total) by mouth daily.     budesonide-formoterol 160-4.5 MCG/ACT inhaler  Commonly known as:  SYMBICORT  Inhale 2 puffs into the lungs 2 (two) times daily.     cyanocobalamin 1000 MCG tablet  Take 1 tablet (1,000 mcg total) by mouth daily.     folic acid 1 MG tablet  Commonly known as:  FOLVITE  Take 1 tablet (1 mg total) by mouth daily.     Gabapentin (Once-Daily) 300 MG Tabs  Commonly known as:  GRALISE  Take 300 mg by mouth 3 (three) times daily.     glipiZIDE 10 MG 24 hr tablet  Commonly known as:  GLUCOTROL XL  Take 2 tablets (20 mg total) by mouth daily with breakfast.     glucose blood test strip  Commonly known as:  ACCU-CHEK AVIVA PLUS  Use as instructed     insulin aspart 100 UNIT/ML FlexPen  Commonly known as:  NOVOLOG  Inject 6-14 Units into the skin 3 (three) times daily with meals. Sliding scale     insulin glargine 100 unit/mL Sopn  Commonly known as:  LANTUS  Inject 0.7 mLs (70 Units total) into the skin at bedtime.     Insulin Pen Needle 32G X 4 MM Misc  Commonly known as:  ULTICARE MICRO  PEN NEEDLES  1 Syringe by Does not apply route 4 (four) times daily - after meals and at bedtime.     levothyroxine 50 MCG tablet  Commonly known as:   SYNTHROID, LEVOTHROID  Take 1 tablet (50 mcg total) by mouth daily.     nitroGLYCERIN 0.4 MG SL tablet  Commonly known as:  NITROSTAT  Place 1 tablet (0.4 mg total) under the tongue every 5 (five) minutes as needed for chest pain.     pantoprazole 40 MG tablet  Commonly known as:  PROTONIX  Take 1 tablet (40 mg total) by mouth daily.     rivaroxaban 20 MG Tabs tablet  Commonly known as:  XARELTO  Take 1 tablet (20 mg total) by mouth daily with supper.     tiotropium 18 MCG inhalation capsule  Commonly known as:  SPIRIVA  Place 1 capsule (18 mcg total) into inhaler and inhale daily.     torsemide 20 MG tablet  Commonly known as:  DEMADEX  Take 2 tablets (40 mg total) by mouth daily.     Vitamin D 2000 units tablet  Take 2,000 Units by mouth daily.        Disposition   The patient will be discharged in stable condition to home. Discharge Instructions    Diet - low sodium heart healthy    Complete by:  As directed      Increase activity slowly    Complete by:  As directed           Follow-up Information    Follow up with [facility].   Why:  Registered Nurse, Physical Therapy and Education officer, museum.    Contact information:   [address] [place] [address] [phone]       Follow up with [facility].   Why:  Rolling Walker and Oxygen   Contact information:   [address] [place] [address] [phone]       Follow up with Loralie Champagne, MD. Go on 12/14/2015.   Specialty:  Cardiology   Why:  at 0900 am in the [facility] --gate code 1000--Please bring all medications   Contact information:   [address] [place] [address] [phone]       Follow up with Christinia Gully, MD On 01/04/2016.   Specialty:  Pulmonary Disease   Why:  1:00 PM lung function test, then 2:00pm appointment with Dr. Melvyn Novas.    Contact information:   [address]  [address] [phone]       Schedule an appointment as soon as possible for a visit with Sandi Mariscal, MD.   Specialty:  Internal Medicine   Why:  as soon as possible within 2 weeks.    Contact information:   [place] [address] [phone]         Duration of Discharge Encounter: Greater than 35 minutes   Signed, Shirley Friar PA-C 12/03/2015, 4:49 PM

## 2015-12-03 NOTE — Progress Notes (Signed)
PULMONARY / CRITICAL CARE MEDICINE   Name: [name] MRN: [id number] DOB: 1961-02-09    HISTORY OF PRESENT ILLNESS:  55 yo wf with acute resp distress with a fib rvr, decompensated with increased WOB requiring intubation on 1/10. Diuresed, BDs, steroids. Extubated 1/12.   SUBJECTIVE: Feels better today. Not wheezing.  Probably going home.    VITAL SIGNS: Temp:  [97.8 F (36.6 C)-98.8 F (37.1 C)] 98 F (36.7 C) (01/19 0744) Pulse Rate:  [50-56] 50 (01/19 0744) Resp:  [16-19] 17 (01/19 0744) BP: (128-145)/(45-72) 128/50 mmHg (01/19 0744) SpO2:  [95 %-99 %] 97 % (01/19 0932) Weight:  [226 lb 1.6 oz (102.558 kg)] 226 lb 1.6 oz (102.558 kg) (01/19 0351) HEMODYNAMICS: CVP:  [10 mmHg-11 mmHg] 10 mmHg VENTILATOR SETTINGS:   INTAKE / OUTPUT: Intake/Output      01/18 0701 - 01/19 0700 01/19 0701 - 01/20 0700   P.O. 900 120   Total Intake(mL/kg) 900 (8.8) 120 (1.2)   Urine (mL/kg/hr) 2450 (1) 600 (1.6)   Stool 1 (0)    Total Output 2451 600   Net -1551 -480          PHYSICAL EXAMINATION: Gen: No distress HEENT: Moist mucus membranes PULM: resps even non labored on 2L, diminished bases otherwise clear, no wheeze or crackles.  CV: RRR. No MRG GI: BS+, protuberant, soft, nontender, nondistended Ext: No edema Neuro: No focal deficits  LABS:  CBC  Recent Labs Lab 11/29/15 0430 11/30/15 0524 12/01/15 0445  WBC 16.4* 14.7* 16.1*  HGB 8.5* 9.7* 10.1*  HCT 26.2* 31.8* 32.5*  PLT 330 333 371   Coag's No results for input(s): APTT, INR in the last 168 hours. BMET  Recent Labs Lab 12/01/15 0445 12/02/15 0300 12/03/15 0630  NA 135 138 139  K 4.9 4.6 5.2*  CL 94* 96* 99*  CO2 33* 35* 32  BUN 94* 85* 70*  CREATININE 2.19* 1.86* 1.40*  GLUCOSE 201* 117* 126*   Electrolytes  Recent Labs Lab 12/01/15 0445 12/02/15 0300 12/03/15 0630  CALCIUM 8.7* 8.9 9.0   Sepsis Markers No results for input(s): LATICACIDVEN, PROCALCITON, O2SATVEN in the last 168  hours. ABG  Recent Labs Lab 11/27/15 0344  PHART 7.509*  PCO2ART 54.0*  PO2ART 63.0*   Liver Enzymes No results for input(s): AST, ALT, ALKPHOS, BILITOT, ALBUMIN in the last 168 hours. Cardiac Enzymes No results for input(s): TROPONINI, PROBNP in the last 168 hours. Glucose  Recent Labs Lab 12/02/15 0259 12/02/15 0731 12/02/15 1116 12/02/15 1609 12/02/15 2134 12/03/15 0747  GLUCAP 126* 186* 181* 318* 145* 108*    Imaging No results found.  DISCUSSION: 55 yo wf with acute resp failure in setting decompensated heart failure, a fib rvr, with likely underlying asthma.    ASSESSMENT / PLAN:  Acute hypoxemic respiratory failure, suspect due primarily to CHF in setting AF RVR Acute bronchitis > may have asthma but doesn't have COPD By PFT or CT chest -Home O2 on d/c  -Continue prednisone with taper  -Continue bronchodilators (symnicort, spiriva, PRN albuterol) - monitor off abx  - outpt pulm f/u with repeat PFT's -- scheduled   CHF, acute decompensated A fib with RVR HTN -Cardiology primary   [facility] for d/c home on O2 from pulmonary standpoint.  Will f/u as outpt with repeat PFT's.     Nickolas Madrid, NP 12/03/2015  10:39 AM Pager: [phone] or [phone]

## 2015-12-03 NOTE — Progress Notes (Signed)
Patient ID: [name], female   DOB: [birth year], 55 y.o.   MRN: [id number]    Advanced Heart Failure Rounding Note   Subjective:    She was intubated 1/10 for hypoxemic respiratory failure and increased workup of breathing.  She converted back to NSR on amiodarone gtt and remains on amiodarone gtt.    1/12 renal dose dopamine added and she diuresed with IV lasix + metolazone.Extubated to 6 liters [name].  1/13 dopamine stopped. Transitioned to po lasix. Weight up 3 pounds.  1/14 lasix switched to po demadex  Weight up 2 lbs despite negative 2 L yesterday.  Torsemide held yesterday for elevated creatinine. Much improved. Coox slightly low at 51.9 this am. Feels ok. Denies SOB or CP.  No lightheadedness or dizziness. Asks for home medicine for anxiety.    Studies: Echo (11/24/15) showed EF 45% with inferolateral hypokinesis and mildly decreased RV systolic function.  This is similar to prior echo from 12/15.    Most recent [place] 10/31/2014: Large caliber vessel with 30% mid stenosis. The first obtuse marginal branch was a small caliber vessel (1.5 mm) with diffuse 80% stenosis in the proximal and mid segment. The second obtuse marginal branch is small in caliber with 40% mid stenosis. The left posterolateral branch has mild diffuse plaque.    Objective:   Weight Range:  Vital Signs:   Temp:  [97.8 F (36.6 C)-98.8 F (37.1 C)] 98 F (36.7 C) (01/19 0744) Pulse Rate:  [50-56] 50 (01/19 0744) Resp:  [16-19] 17 (01/19 0744) BP: (128-145)/(45-72) 128/50 mmHg (01/19 0744) SpO2:  [95 %-99 %] 98 % (01/19 0744) Weight:  [226 lb 1.6 oz (102.558 kg)] 226 lb 1.6 oz (102.558 kg) (01/19 0351) Last BM Date: 12/02/15  Weight change: Filed Weights   12/01/15 0949 12/02/15 0244 12/03/15 0351  Weight: 225 lb 1.4 oz (102.1 kg) 224 lb 4.8 oz (101.742 kg) 226 lb 1.6 oz (102.558 kg)    Intake/Output:   Intake/Output Summary (Last 24 hours) at 12/03/15 0811 Last data filed at 12/03/15 0350  Gross  per 24 hour  Intake    720 ml  Output   2451 ml  Net  -1731 ml     Physical Exam: CVP 7 General:  NAD.   Sitting in the chair HEENT: normal Neck: Thick. JVP difficult to assess, appears stable.  Cor: PMI nondisplaced. Regular S1S2. No M/G/R noted Lungs: Decreased. Scattered rhonchi that clears with cough.  Abdomen:  Obese soft, NT, ND, no HSM. No bruits or masses. +BS  Extremities: no cyanosis, clubbing, rash. Tr ankle edema.  Neuro: Alert and Oriented x3.   Telemetry:  Reviewed personally, Loletha Grayer 40-50s   Labs: Basic Metabolic Panel:  Recent Labs Lab 11/29/15 0430 11/30/15 0524 12/01/15 0445 12/02/15 0300 12/03/15 0630  NA 139 139 135 138 139  K 4.3 4.7 4.9 4.6 5.2*  CL 96* 95* 94* 96* 99*  CO2 35* 34* 33* 35* 32  GLUCOSE 163* 166* 201* 117* 126*  BUN 76* 83* 94* 85* 70*  CREATININE 1.77* 1.77* 2.19* 1.86* 1.40*  CALCIUM 9.0 9.3 8.7* 8.9 9.0    Liver Function Tests: No results for input(s): AST, ALT, ALKPHOS, BILITOT, PROT, ALBUMIN in the last 168 hours. No results for input(s): LIPASE, AMYLASE in the last 168 hours. No results for input(s): AMMONIA in the last 168 hours.  CBC:  Recent Labs Lab 11/27/15 0445 11/28/15 0530 11/29/15 0430 11/30/15 0524 12/01/15 0445  WBC 14.5* 13.6* 16.4* 14.7* 16.1*  NEUTROABS  9.8*  --   --   --   --   HGB 9.3* 9.1* 8.5* 9.7* 10.1*  HCT 30.1* 28.5* 26.2* 31.8* 32.5*  MCV 91.8 91.6 90.7 90.9 91.5  PLT 304 289 330 333 371    Cardiac Enzymes: No results for input(s): CKTOTAL, CKMB, CKMBINDEX, TROPONINI in the last 168 hours.  BNP: BNP (last 3 results)  Recent Labs  10/15/15 1355 11/23/15 2234 12/01/15 0458  BNP 246.5* 523.5* 174.9*    ProBNP (last 3 results) No results for input(s): PROBNP in the last 8760 hours.    Other results:  Imaging: No results found.   Medications:     Scheduled Medications: . amiodarone  200 mg Oral BID  . antiseptic oral rinse  7 mL Mouth Rinse q12n4p  . atorvastatin  40  mg Oral Daily  . bisoprolol  2.5 mg Oral Daily  . budesonide-formoterol  2 puff Inhalation BID  . chlorhexidine  15 mL Mouth Rinse BID  . cholecalciferol  2,000 Units Oral Daily  . feeding supplement (GLUCERNA SHAKE)  237 mL Oral BID BM  . folic acid  1 mg Oral Daily  . gabapentin  300 mg Oral TID AC & HS  . insulin aspart  0-20 Units Subcutaneous TID WC & HS  . insulin aspart  14 Units Subcutaneous TID WC  . insulin glargine  70 Units Subcutaneous QHS  . levothyroxine  50 mcg Oral QAC breakfast  . pantoprazole  40 mg Oral Daily  . rivaroxaban  15 mg Oral Q supper  . senna-docusate  1 tablet Per Tube BID  . tiotropium  18 mcg Inhalation Daily    Infusions:    PRN Medications: acetaminophen, acetaminophen-codeine, albuterol, ALPRAZolam, benzonatate, fentaNYL (SUBLIMAZE) injection, hydrALAZINE, menthol-cetylpyridinium, ondansetron (ZOFRAN) IV   Assessment/Plan :   1. Acute hypoxemic on chronic respiratory failure: Suspect combination of CHF and COPD exacerbation. Extubated. 1/12 to 6 liters. Now on 2L.  - Improved with diuresis nebs, steroids, and antibiotics.  - Arrangements made for home 02.  - Sees pulmonary 2/20 for PFTs and MD visit same day. Appreciate pulmonary assistance in obtaining this.  2. Atrial fibrillation with RVR: History of PAF, on Xarelto at home.  - Rate now 40-50s. Holding coreg.  - Continue amio 200 mg BID. Will cut back to 200 mg daily at next visit, likely down to 100 mg daily eventually with ongoing lung issues.  3. Acute on chronic systolic CHF: EF [id number] on echo with inferolateral hypokinesis and mildly decreased RV systolic function.  She had a similar echo pattern in 12/15 and cardiac cath in 12/15 with nonobstructive and relatively mild CAD.  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  She briefly required norepinephrine, may have been due to sedation (dropped BP after intubation), now stable BP off pressors. She initially diuresed  poorly, then low dose dopamine added.  Brisk diuresis noted with dopamine.   - Renal function bumped with uptrend in BUN. Negative 6.4 liters overall. Weight down 19 lbs overall. - Volume status stable. CVP 7. Trace ankle edema. Start back on torsemide. Reviewed use of sliding scale diuretics ( extra 20 mg torsemide for weight gain of 3 lbs over night or 5 lbs in one week). Instructed to inform HF clinic if she has to do this multiple times, as we will need to check her Cr and K, and consider changing her chronic dosing.  - No losartan for now with recent creatinine up. - Will hold  bisoprolol with bradycardia and ? Low output.  4. COPD: Acute exacerbation.  - Nebs, steroids, abx per CCM.  - Extubated 1/12 to 6 liters. Now stable on 2L, de-sats off. Will get home 02.  5. PAD: Extensive PAD history.  She had been on ASA, Plavix and Xarelto at home. Last peripheral PCI was in 2014.   - Now off ASA and Plavix with Xarelto. 6. AKI on CKD stage III - Creatinine improved 2.1> 1.86 > 1.4.  - Will follow with labs via [facility] next week.   7. Anemia: Chronic.    - Hgb 10.1  12/01/15. No bleeding.  - Off Plavix and ASA.  - Per pharmacy, with improved CrCl xarelto 20 mg daily. Calculated for ACTUAL body weight with her obesity.  8. Cirrhosis:  - ETOH-related per report.  9. CAD: Nonobstructive on 12/15 cath.   - TnI mildly elevated this admission, max 0.28 with minimal trend.  Suspect demand ischemia with hypoxemia and volume overload.   - No cath for now with AKI. - Echo wall motion pattern relatively unchanged from prior echo in 12/15.  10. Deconditioning: - Continue PT. Current recommendation HHPT with 24 hr supervision.  - Will consult case management to help work for [facility].  11. Uncontrolled DM- Diabetes Coordinator input appreciated. Meal coverage increased to Novolog  14 units TID. Continue lantus 70 units qhs, and resistant sliding scale coverage.  12. Social- Lives in [place] with her siser.  Gets food stamps. High risk for admits due to poor insight. She has close follow up in the HF clinic.   [facility] following for [facility] and PT. Home 02 as well.  Will get BMET next week via [facility].   Shirley Friar PA-C  12/03/2015 8:11 AM   Advanced Heart Failure Team Pager [phone] (M-F; 7a - 4p)  Please contact [facility] for night-coverage after hours (4p -7a ) and weekends on [email or web address]  Patient seen with PA, agree with the above note.  She is stable today. Think she can go home with close followup.  Will need steroid taper and inhalers per pulmonary.  She has finished antibiotic.   Home on amiodarone 200 mg bid until followup visit.  Will continue Xarelto, will need to check on dosing with pharmacy as she is borderline for decreasing to 15 mg daily.  Stop ASA and Plavix.  Will be on torsemide 40 mg daily with no K (K high today).  With bradycardia, hold bisoprolol for now and no ACEI/ARB with elevated creatinine.    Loralie Champagne 12/03/2015 1:56 PM

## 2015-12-04 ENCOUNTER — Encounter (HOSPITAL_COMMUNITY): Payer: Medicare Other

## 2015-12-07 ENCOUNTER — Encounter (HOSPITAL_COMMUNITY): Payer: Medicare Other

## 2015-12-07 ENCOUNTER — Encounter: Payer: Self-pay | Admitting: Surgery

## 2015-12-07 ENCOUNTER — Ambulatory Visit (INDEPENDENT_AMBULATORY_CARE_PROVIDER_SITE_OTHER): Payer: Medicare Other | Admitting: Surgery

## 2015-12-07 VITALS — BP 147/81 | HR 55 | Temp 98.1°F | Ht 64.0 in | Wt 231.3 lb

## 2015-12-07 DIAGNOSIS — I6523 Occlusion and stenosis of bilateral carotid arteries: Secondary | ICD-10-CM

## 2015-12-07 NOTE — Telephone Encounter (Signed)
Called and spoke with patient. Patient states she is scheduled to have a PFT and see MW on 01/04/16. I explained to her that BQ was requesting a ov with him or TP in 4 weeks.  Patient stated she would like to keep ov with MW. Pt voiced understanding and had no further questions. Nothing further needed.

## 2015-12-07 NOTE — Progress Notes (Signed)
Patient name: [name] MRN: [id number] DOB: 27-Sep-1961 Sex: female     Chief Complaint  Patient presents with  . Re-evaluation    9 month f/u     HISTORY OF PRESENT ILLNESS: The patient is back today for follow-up. On 02/19/2015 she underwent left carotid endarterectomy with bovine pericardial patch angioplasty for asymptomatic high-grade left carotid stenosis.. Intraoperative findings included 95% stenosis. 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 Her postoperative course was uncomplicated.    she reports no neurologic symptoms with the exception of sciatic pain in her legs which is chronic.  She was recently hospitalized for pneumonia.  She is recovering from that. She was supposed to have a ultrasound of her carotid artery with Dr. Gwenlyn Found, however she was in the hospital during that appointment.  Past Medical History  Diagnosis Date  . Alcohol abuse   . Narcotic abuse   . Cocaine abuse   . Marijuana abuse   . Tobacco abuse   . Alcoholic cirrhosis ([place])   . Cardiomyopathy ([place])   . Obesity   . Chronic combined systolic and diastolic CHF (congestive heart failure) ([place])     a. Last echo 12/205: EF 40-45%, inferoapical/posterior HK, not technically sufficient to allow eval of LV diastolic function, normal LA in size..  . Hypothyroidism   . GERD (gastroesophageal reflux disease)   . PAF (paroxysmal atrial fibrillation) ([place])   . Critical lower limb ischemia   . Hypertension   . CAD (coronary artery disease)     a. cath 11/10/2014 LM nl, LAD min irregs, D1 30 ost, D2 50d, LCX 74m OM1 80 p/m (1.5 mm vessel), OM2 461mRCA nondom 9057mmed rx.. Demand ischemia in the setting of rapid a-fib.  . AMarland Kitchenxiety   . Poorly controlled type 2 diabetes mellitus ([place]CC[place] . Hyperlipemia   . Peripheral arterial disease ([place]CC[place]lippery [place]   a. 01/2015 Angio/PTA: LSFA 100 w/ recon @ adductor canal and 1 vessel runoff via AT, RSFA 99 (atherectomy/pta) - 1 vessel runoff via diff dzs  peroneal.  . Carotid artery disease (HCCNatchitoc[place]   a. 01/2015 Carotid Angio: RICA [id number] L carotid endarterectomy 02/2015.  . CMarland KitchenPD (chronic obstructive pulmonary disease) (HCCConneaut [place] . Diabetic peripheral neuropathy (HCCChew[place] . Paroxysmal atrial tachycardia (HCCBelt . Noncompliance   . Anemia   . B12 deficiency   . Alcoholic cirrhosis ([place]CC[place] . NSVT (nonsustained ventricular tachycardia) (HCC[place]john Island . Paroxysmal atrial tachycardia (HCCLake[place] . Hypokalemia   . Hypomagnesemia   . Renal insufficiency     a. 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  . EMarland Kitchenevated troponin     a. Chronic elevation.    Past Surgical History  Procedure Laterality Date  . Cardioversion  ~ 02/2013    "twice"   . Lower extremity angiogram N/A 09/10/2013    Procedure: LOWER EXTREMITY ANGIOGRAM;  Surgeon: JonLorretta HarpD;  [facility];  Service: Cardiovascular;  Laterality: N/A;  . Left heart catheterization with coronary angiogram N/A 10/31/2014    Procedure: LEFT HEART CATHETERIZATION WITH CORONARY ANGIOGRAM;  Surgeon: ChrBurnell BlanksD;  [facility];  Service: Cardiovascular;  Laterality: N/A;  . Peripheral athrectomy Right 01/15/2015    SFA/notes 01/15/2015  . Balloon angioplasty, artery Right 01/15/2015    SFA/notes 01/15/2015  . Cardiac catheterization    . Lower extremity angiogram N/A 01/15/2015    Procedure: LOWER EXTREMITY ANGIOGRAM;  Surgeon: [name], MD;  Location[facility] LAB;  Service: Cardiovascular;  Laterality: N/A;  . Carotid angiogram N/A 01/15/2015    Procedure: CAROTID ANGIOGRAM;  Surgeon: [name], MD;  Location: [facility] CATH LAB;  Service: Cardiovascular;  Laterality: N/A;  . Endarterectomy Left 02/19/2015    Procedure: LEFT CAROTID ENDARTERECTOMY ;  Surgeon: Serafina Mitchell, MD;  Location: [facility] OR;  Service: Vascular;  Laterality: Left;  . Dilation and curettage of uterus  1988    Social History   Social History  . Marital Status: Divorced    Spouse Name: N/A  . Number of  Children: N/A  . Years of Education: N/A   Occupational History  . disabled    Social History Main Topics  . Smoking status: Former Smoker -- 0.00 packs/day for 40 years    Types: E-cigarettes    Quit date: 11/03/2015  . Smokeless tobacco: Former Systems developer  . Alcohol Use: No  . Drug Use: No     Comment: 04/29/2015 "last drug use was ~ 09/08/2013"  . Sexual Activity: No   Other Topics Concern  . Not on file   Social History Narrative   Lives in [place], in [place] with sister.  They are looking to move but don't have a place to go yet.      Family History  Problem Relation Age of Onset  . Hypertension Mother   . Diabetes Mother   . Cancer Mother     breast, ovarian, colon  . Clotting disorder Mother   . Heart disease Mother   . Heart attack Mother   . Hypertension Father   . Heart disease Father   . Emphysema Sister     smoked    Allergies as of 12/07/2015  . (No Active Allergies)    Current Outpatient Prescriptions on File Prior to Visit  Medication Sig Dispense Refill  . acetaminophen (TYLENOL) 500 MG tablet Take 1,000 mg by mouth every 6 (six) hours as needed (pain).    [name] acetaminophen-codeine (TYLENOL #3) 300-30 MG tablet Take 1 tablet by mouth every 4 (four) hours as needed for moderate pain.    [name] albuterol (PROVENTIL HFA;VENTOLIN HFA) 108 (90 BASE) MCG/ACT inhaler Inhale 2 puffs into the lungs every 6 (six) hours as needed for wheezing or shortness of breath. 1 each 3  . albuterol (PROVENTIL) (2.5 MG/3ML) 0.083% nebulizer solution Take 3 mLs (2.5 mg total) by nebulization every 6 (six) hours as needed. For wheezing. 75 mL 2  . amiodarone (PACERONE) 200 MG tablet Take 1 tablet (200 mg total) by mouth 2 (two) times daily. 60 tablet 6  . atorvastatin (LIPITOR) 40 MG tablet Take 1 tablet (40 mg total) by mouth daily. 30 tablet 2  . Blood Glucose Monitoring Suppl (ACCU-CHEK AVIVA PLUS) W/DEVICE KIT 1 Device by Does not apply route 4 (four) times daily -  before meals and at  bedtime. 1 kit 0  . budesonide-formoterol (SYMBICORT) 160-4.5 MCG/ACT inhaler Inhale 2 puffs into the lungs 2 (two) times daily. 1 Inhaler 12  . Cholecalciferol (VITAMIN D) 2000 UNITS tablet Take 2,000 Units by mouth daily.    . folic acid (FOLVITE) 1 MG tablet Take 1 tablet (1 mg total) by mouth daily. 30 tablet 5  . Gabapentin, Once-Daily, (GRALISE) 300 MG TABS Take 300 mg by mouth 3 (three) times daily. 270 tablet 3  . glipiZIDE (GLUCOTROL XL) 10 MG 24 hr tablet Take 2 tablets (20 mg total) by mouth daily with breakfast.  60 tablet 3  . glucose blood (ACCU-CHEK AVIVA PLUS) test strip Use as instructed 100 each 12  . insulin aspart (NOVOLOG) 100 UNIT/ML FlexPen Inject 6-14 Units into the skin 3 (three) times daily with meals. Sliding scale 15 mL 11  . insulin glargine (LANTUS) 100 unit/mL SOPN Inject 0.7 mLs (70 Units total) into the skin at bedtime. 15 mL 11  . Insulin Pen Needle (ULTICARE MICRO PEN NEEDLES) 32G X 4 MM MISC 1 Syringe by Does not apply route 4 (four) times daily - after meals and at bedtime. 1 each 11  . Lancets (ACCU-CHEK SOFT TOUCH) lancets Use as instructed 100 each 12  . levothyroxine (SYNTHROID, LEVOTHROID) 50 MCG tablet Take 1 tablet (50 mcg total) by mouth daily. 30 tablet 2  . nitroGLYCERIN (NITROSTAT) 0.4 MG SL tablet Place 1 tablet (0.4 mg total) under the tongue every 5 (five) minutes as needed for chest pain. 25 tablet 3  . pantoprazole (PROTONIX) 40 MG tablet Take 1 tablet (40 mg total) by mouth daily. 30 tablet 2  . rivaroxaban (XARELTO) 20 MG TABS tablet Take 1 tablet (20 mg total) by mouth daily with supper. (Patient taking differently: Take 20 mg by mouth at bedtime. ) 30 tablet 2  . tiotropium (SPIRIVA) 18 MCG inhalation capsule Place 1 capsule (18 mcg total) into inhaler and inhale daily. 30 capsule 12  . torsemide (DEMADEX) 20 MG tablet Take 2 tablets (40 mg total) by mouth daily. 60 tablet 6  . vitamin B-12 1000 MCG tablet Take 1 tablet (1,000 mcg total) by  mouth daily. 30 tablet 5   No current facility-administered medications on file prior to visit.     REVIEW OF SYSTEMS: Cardiovascular: No chest pain, chest pressure Pulmonary:  Positive shortness of breath Neurologic: No weakness, paresthesias, aphasia, or amaurosis. No dizziness. Hematologic: No bleeding problems or clotting disorders. Musculoskeletal: No joint pain or joint swelling. Gastrointestinal: No blood in stool or hematemesis Genitourinary: No dysuria or hematuria. Psychiatric:: No history of major depression. Integumentary:  Right leg was Constitutional: No fever or chills.  PHYSICAL EXAMINATION:   Vital signs are  Filed Vitals:   12/07/15 0940 12/07/15 0943  BP: 159/85 147/81  Pulse: 55   Temp: 98.1 F (36.7 C)   TempSrc: Oral   Height: '5\' 4"'  (1.626 m)   Weight: 231 lb 4.8 oz (104.917 kg)   SpO2: 96%    Body mass index is 39.68 kg/(m^2). General: The patient appears their stated age. HEENT:  No gross abnormalities Pulmonary:  Non labored breathing Musculoskeletal: There are no major deformities. Neurologic: No focal weakness or paresthesias are detected, Skin:  Small fluid-filled blister on the right anterior shin without erythema. Psychiatric: The patient has normal affect. Cardiovascular: There is a regular rate and rhythm without significant murmur appreciated. No carotid bruits   Diagnostic Studies  I have reviewed her carotid ultrasound from 03/31/2015 which shows a widely patent left carotid endarterectomy site.  Assessment:  status post left carotid endarterectomy Plan:  I encouraged the patient to have her carotid duplex which was Miss because she was in the hospital rescheduled.  Her initial 3 month follow-up scan shows a widely patent endarterectomy site. I will defer future testing to Dr. Gwenlyn Found.  The patient will follow-up with me on an as-needed basis  V. Leia Alf, M.D. [facility] Office:  [phone] Pager:  [phone]

## 2015-12-09 ENCOUNTER — Encounter (HOSPITAL_COMMUNITY): Payer: Medicare Other

## 2015-12-11 ENCOUNTER — Encounter (HOSPITAL_COMMUNITY): Payer: Medicare Other

## 2015-12-14 ENCOUNTER — Encounter (HOSPITAL_COMMUNITY): Payer: Medicare Other

## 2015-12-14 ENCOUNTER — Ambulatory Visit (HOSPITAL_COMMUNITY)
Admit: 2015-12-14 | Discharge: 2015-12-14 | Disposition: A | Discharge status: Home / Self Care | Payer: Medicare Other | Source: Ambulatory Visit | Attending: Cardiology | Admitting: Cardiology

## 2015-12-14 ENCOUNTER — Other Ambulatory Visit (HOSPITAL_COMMUNITY): Payer: Self-pay | Admitting: Cardiology

## 2015-12-14 ENCOUNTER — Encounter (HOSPITAL_COMMUNITY): Payer: Self-pay

## 2015-12-14 VITALS — BP 130/72 | HR 91 | Wt 237.8 lb

## 2015-12-14 DIAGNOSIS — I5032 Chronic diastolic (congestive) heart failure: Secondary | ICD-10-CM | POA: Diagnosis not present

## 2015-12-14 DIAGNOSIS — I739 Peripheral vascular disease, unspecified: Secondary | ICD-10-CM | POA: Insufficient documentation

## 2015-12-14 DIAGNOSIS — E1122 Type 2 diabetes mellitus with diabetic chronic kidney disease: Secondary | ICD-10-CM | POA: Insufficient documentation

## 2015-12-14 DIAGNOSIS — J449 Chronic obstructive pulmonary disease, unspecified: Secondary | ICD-10-CM | POA: Diagnosis not present

## 2015-12-14 DIAGNOSIS — Z87891 Personal history of nicotine dependence: Secondary | ICD-10-CM | POA: Diagnosis not present

## 2015-12-14 DIAGNOSIS — Z7902 Long term (current) use of antithrombotics/antiplatelets: Secondary | ICD-10-CM | POA: Diagnosis not present

## 2015-12-14 DIAGNOSIS — Z79899 Other long term (current) drug therapy: Secondary | ICD-10-CM | POA: Insufficient documentation

## 2015-12-14 DIAGNOSIS — K7460 Unspecified cirrhosis of liver: Secondary | ICD-10-CM | POA: Diagnosis not present

## 2015-12-14 DIAGNOSIS — N183 Chronic kidney disease, stage 3 (moderate): Secondary | ICD-10-CM | POA: Diagnosis not present

## 2015-12-14 DIAGNOSIS — Z794 Long term (current) use of insulin: Secondary | ICD-10-CM | POA: Insufficient documentation

## 2015-12-14 DIAGNOSIS — Z8249 Family history of ischemic heart disease and other diseases of the circulatory system: Secondary | ICD-10-CM | POA: Insufficient documentation

## 2015-12-14 DIAGNOSIS — I480 Paroxysmal atrial fibrillation: Secondary | ICD-10-CM | POA: Diagnosis not present

## 2015-12-14 DIAGNOSIS — E039 Hypothyroidism, unspecified: Secondary | ICD-10-CM | POA: Insufficient documentation

## 2015-12-14 DIAGNOSIS — I428 Other cardiomyopathies: Secondary | ICD-10-CM | POA: Insufficient documentation

## 2015-12-14 DIAGNOSIS — Z9981 Dependence on supplemental oxygen: Secondary | ICD-10-CM | POA: Insufficient documentation

## 2015-12-14 DIAGNOSIS — I5022 Chronic systolic (congestive) heart failure: Secondary | ICD-10-CM | POA: Insufficient documentation

## 2015-12-14 DIAGNOSIS — I2510 Atherosclerotic heart disease of native coronary artery without angina pectoris: Secondary | ICD-10-CM | POA: Diagnosis not present

## 2015-12-14 DIAGNOSIS — I4891 Unspecified atrial fibrillation: Secondary | ICD-10-CM | POA: Diagnosis not present

## 2015-12-14 LAB — TSH: TSH: 7.074 u[IU]/mL — AB (ref 0.350–4.500)

## 2015-12-14 LAB — BRAIN NATRIURETIC PEPTIDE: B NATRIURETIC PEPTIDE 5: 346.8 pg/mL — AB (ref 0.0–100.0)

## 2015-12-14 LAB — COMPREHENSIVE METABOLIC PANEL
ALT: 53 U/L (ref 14–54)
ANION GAP: 12 (ref 5–15)
AST: 43 U/L — ABNORMAL HIGH (ref 15–41)
Albumin: 3.1 g/dL — ABNORMAL LOW (ref 3.5–5.0)
Alkaline Phosphatase: 171 U/L — ABNORMAL HIGH (ref 38–126)
BUN: 25 mg/dL — ABNORMAL HIGH (ref 6–20)
CALCIUM: 8.9 mg/dL (ref 8.9–10.3)
CHLORIDE: 102 mmol/L (ref 101–111)
CO2: 25 mmol/L (ref 22–32)
Creatinine, Ser: 1.04 mg/dL — ABNORMAL HIGH (ref 0.44–1.00)
GFR calc non Af Amer: >60 mL/min (ref >60)
Glucose, Bld: 169 mg/dL — ABNORMAL HIGH (ref 65–99)
Potassium: 4.3 mmol/L (ref 3.5–5.1)
SODIUM: 139 mmol/L (ref 135–145)
Total Bilirubin: 0.3 mg/dL (ref 0.3–1.2)
Total Protein: 7 g/dL (ref 6.5–8.1)

## 2015-12-14 MED ORDER — BISOPROLOL FUMARATE 5 MG PO TABS: 2.5000 mg | ORAL_TABLET | Freq: Every day | ORAL | Status: DC

## 2015-12-14 MED ORDER — TORSEMIDE 20 MG PO TABS: ORAL_TABLET | ORAL | Status: DC

## 2015-12-14 NOTE — Patient Instructions (Signed)
Start Bisoprolol 2.5 mg (1/2 tab) daily  Increase Torsemide to 40 mg (2 tabs) Twice daily FOR 4 DAYS ONLY, THEN take Torsemide 40 (2 tabs) in AM and 20 mg (1 tab) in PM  Labs today  Please place compression hose in the AM as soon as you get up and remove before bed in PM  Your physician has requested that you have a lower extremity arterial exercise duplex. During this test, exercise and ultrasound are used to evaluate arterial blood flow in the legs. Allow one hour for this exam. There are no restrictions or special instructions.  Your physician recommends that you schedule a follow-up appointment in: 1 week with our PA and 4 weeks with Dr Aundra Dubin

## 2015-12-14 NOTE — Addendum Note (Signed)
Encounter addended by: Larey Dresser, MD on: 12/14/2015 10:48 PM<BR>     Documentation filed: Clinical Notes

## 2015-12-14 NOTE — Progress Notes (Signed)
Patient ID: [name], female   DOB: [birth year], 55 y.o.   MRN: [id number] PCP: Dr Roxy Manns HF Cardiology: Dr. Aundra Dubin PV: Dr. Gwenlyn Found  55 yo with history of PAD, carotid stenosis s/p left CEA, relatively mild CAD, chronic systolic CHF, paroxysmal atrial fibrillation and prior substance abuse presents for CHF clinic followup.  She was admitted in 1/17 with acute hypoxemic respiratory failure in the setting of atrial fibrillation/RVR and volume overload.  She was initially intubated.  She converted back to NSR with amiodarone gtt.  She was treated with IV Lasix, steroids, bronchodilators.  She developed AKI and losartan was stopped.  She is now at home with her sister.  Since getting home, weight is up a bit.  Her diet is abysmal (popcorn, [place] food, pizza).  She is not short of breath walking around her house but does get short of breath after walking 100-200 feet.  She was short of breath walking into the office today.  She has quit smoking since her recent admission.  She has not had ETOH or cocaine in a long time now.  She is on home oxygen since hospital discharge.  No chest pain.  Right thigh/calf pain with ambulation only a short distance, this has worsened over the last couple of months.    ECG: NSR, LVH  Labs (1/17): K 5.2, creatinine 1.4  PMH: 1. Carotid stenosis: Known occluded right carotid.  Left CEA in 4/16.  2. CAD: LHC in 12/15 with 80% stenosis in small OM1, nonobstructive disease in other territories.  3. Chronic systolic CHF: Nonischemic cardiomyopathy (?due to ETOH or prior drug abuse).  - Echo (1/17) with EF 45%, mild LV hypertrophy, moderate diastolic dysfunction, inferolateral severe hypokinesis, mildly decreased RV systolic function.  4. Atrial fibrillation: Paroxysmal.   5. Type II diabetes 6. CKD: Stage III. 7. COPD: Quit smoking 1/17. On home oxygen.  8. 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  No longer drinks.  9. Hypothyroidism 10. PAD: Atherectomy SFA in 2014 (Dr  Gwenlyn Found).  11. Anemia 12. Prior cocaine abuse.   SH: Lives with sister.  Prior cocaine abuse.  Prior ETOH abuse.  Quit smoking in 1/17.  FH: CAD  ROS: All systems reviewed and negative except as per HPI.   Current Outpatient Prescriptions  Medication Sig Dispense Refill  . acetaminophen (TYLENOL) 500 MG tablet Take 1,000 mg by mouth every 6 (six) hours as needed (pain).    [name] acetaminophen-codeine (TYLENOL #3) 300-30 MG tablet Take 1 tablet by mouth every 4 (four) hours as needed for moderate pain.    [name] albuterol (PROVENTIL HFA;VENTOLIN HFA) 108 (90 BASE) MCG/ACT inhaler Inhale 2 puffs into the lungs every 6 (six) hours as needed for wheezing or shortness of breath. 1 each 3  . albuterol (PROVENTIL) (2.5 MG/3ML) 0.083% nebulizer solution Take 3 mLs (2.5 mg total) by nebulization every 6 (six) hours as needed. For wheezing. 75 mL 2  . amiodarone (PACERONE) 200 MG tablet Take 1 tablet (200 mg total) by mouth 2 (two) times daily. 60 tablet 6  . atorvastatin (LIPITOR) 40 MG tablet Take 1 tablet (40 mg total) by mouth daily. 30 tablet 2  . Blood Glucose Monitoring Suppl (ACCU-CHEK AVIVA PLUS) W/DEVICE KIT 1 Device by Does not apply route 4 (four) times daily -  before meals and at bedtime. 1 kit 0  . budesonide-formoterol (SYMBICORT) 160-4.5 MCG/ACT inhaler Inhale 2 puffs into the lungs 2 (two) times daily. 1 Inhaler 12  . Cholecalciferol (VITAMIN D) 2000  UNITS tablet Take 2,000 Units by mouth daily.    . folic acid (FOLVITE) 1 MG tablet Take 1 tablet (1 mg total) by mouth daily. 30 tablet 5  . Gabapentin, Once-Daily, (GRALISE) 300 MG TABS Take 300 mg by mouth 3 (three) times daily. 270 tablet 3  . glipiZIDE (GLUCOTROL XL) 10 MG 24 hr tablet Take 2 tablets (20 mg total) by mouth daily with breakfast. 60 tablet 3  . glucose blood (ACCU-CHEK AVIVA PLUS) test strip Use as instructed 100 each 12  . insulin aspart (NOVOLOG) 100 UNIT/ML FlexPen Inject 6-14 Units into the skin 3 (three) times daily with  meals. Sliding scale 15 mL 11  . insulin glargine (LANTUS) 100 unit/mL SOPN Inject 0.7 mLs (70 Units total) into the skin at bedtime. 15 mL 11  . Insulin Pen Needle (ULTICARE MICRO PEN NEEDLES) 32G X 4 MM MISC 1 Syringe by Does not apply route 4 (four) times daily - after meals and at bedtime. 1 each 11  . Lancets (ACCU-CHEK SOFT TOUCH) lancets Use as instructed 100 each 12  . levothyroxine (SYNTHROID, LEVOTHROID) 50 MCG tablet Take 1 tablet (50 mcg total) by mouth daily. 30 tablet 2  . pantoprazole (PROTONIX) 40 MG tablet Take 1 tablet (40 mg total) by mouth daily. 30 tablet 2  . rivaroxaban (XARELTO) 20 MG TABS tablet Take 1 tablet (20 mg total) by mouth daily with supper. (Patient taking differently: Take 20 mg by mouth at bedtime. ) 30 tablet 2  . tiotropium (SPIRIVA) 18 MCG inhalation capsule Place 1 capsule (18 mcg total) into inhaler and inhale daily. 30 capsule 12  . torsemide (DEMADEX) 20 MG tablet Take 2 tabs in AM and 1 tab in PM 90 tablet 6  . vitamin B-12 1000 MCG tablet Take 1 tablet (1,000 mcg total) by mouth daily. 30 tablet 5  . bisoprolol (ZEBETA) 5 MG tablet Take 0.5 tablets (2.5 mg total) by mouth daily. 15 tablet 3  . nitroGLYCERIN (NITROSTAT) 0.4 MG SL tablet Place 1 tablet (0.4 mg total) under the tongue every 5 (five) minutes as needed for chest pain. (Patient not taking: Reported on 12/14/2015) 25 tablet 3   No current facility-administered medications for this encounter.   BP 130/72 mmHg  Pulse 91  Wt 237 lb 12 oz (107.843 kg)  SpO2 99%  LMP 11/27/2012 General: NAD Neck: Thick, JVP 8-9 cm, no thyromegaly or thyroid nodule.  Lungs: Distant breath sounds bilaterally.  CV: Nondisplaced PMI.  Heart regular S1/S2, no S3/S4, no murmur.  1+ edema to knees bilaterally.  No carotid bruit.  Unable to palpate pedal pulses.  Abdomen: Soft, nontender, no hepatosplenomegaly, no distention.  Skin: Intact without lesions or rashes.  Neurologic: Alert and oriented x 3.  Psych:  Normal affect. Extremities: No clubbing or cyanosis.  HEENT: Normal.   Assessment/Plan: 1. Chronic systolic CHF: EF 70% on last echo. Has been presumed nonischemic given LHC in 12/15 showing only 80% stenosis in small OM1. Recent admission with acute on chronic systolic CHF likely triggered by atrial fibrillation/RVR + dietary indiscretion.  On exam today, she is at least mildly volume overloaded with NYHA class III symptoms.  Still following a high sodium diet.  - We discussed cutting back on dietary sodium.  - Increase torsemide to 40 mg bid x 2 days then 40 qam/20 qpm long-term. - BMET/BNP today, repeat in 1 week on higher torsemide.  - Restart bisoprolol 2.5 mg daily.  - No ARB with elevated creatinine, will see  what creatinine is running today and continue restarting.  - Wear compression stockings.  2. Atrial fibrillation: Now in NSR on amiodarone.  She is on Xarelto 20 mg daily.  - 1 more week of amiodarone 200 mg bid, then decrease to daily.  Check LFTs and TSH today.  She will need regular eye exams while on amiodarone.  2. PAD: Right thigh/calf claudication, difficult to palpate pulses.  I will arrange for peripheral arterial dopplers to evaluate.   3. COPD: On home oxygen.  Has quit smoking, needs to stay off cigarettes.  4. H/o cirrhosis: Likely from ETOH, no longer drinks.   Loralie Champagne 12/14/2015

## 2015-12-16 ENCOUNTER — Encounter (HOSPITAL_COMMUNITY): Payer: Medicare Other

## 2015-12-17 ENCOUNTER — Other Ambulatory Visit (HOSPITAL_COMMUNITY): Payer: Self-pay | Admitting: *Deleted

## 2015-12-18 ENCOUNTER — Encounter (HOSPITAL_COMMUNITY): Payer: Medicare Other

## 2015-12-18 ENCOUNTER — Ambulatory Visit (INDEPENDENT_AMBULATORY_CARE_PROVIDER_SITE_OTHER): Payer: Medicare Other | Admitting: Cardiovascular Disease

## 2015-12-18 ENCOUNTER — Encounter: Payer: Self-pay | Admitting: Cardiovascular Disease

## 2015-12-18 VITALS — BP 130/62 | HR 70 | Ht 64.0 in | Wt 235.0 lb

## 2015-12-18 DIAGNOSIS — I739 Peripheral vascular disease, unspecified: Secondary | ICD-10-CM

## 2015-12-18 DIAGNOSIS — I5032 Chronic diastolic (congestive) heart failure: Secondary | ICD-10-CM

## 2015-12-18 DIAGNOSIS — I779 Disorder of arteries and arterioles, unspecified: Secondary | ICD-10-CM | POA: Diagnosis not present

## 2015-12-18 DIAGNOSIS — Z9862 Peripheral vascular angioplasty status: Secondary | ICD-10-CM | POA: Diagnosis not present

## 2015-12-18 DIAGNOSIS — I4891 Unspecified atrial fibrillation: Secondary | ICD-10-CM

## 2015-12-18 DIAGNOSIS — I6523 Occlusion and stenosis of bilateral carotid arteries: Secondary | ICD-10-CM

## 2015-12-18 NOTE — Assessment & Plan Note (Signed)
History of peripheral arterial disease with known occluded left SFA and recent right SFA re-intervention by myself 01/15/15 with directional atherectomy and drug-eluting stenting. She has 1 vessel runoff bilaterally with a patent peroneal on the right and anterior tibial on the left. Her most recent lower extremity arterial Doppler studies performed 01/23/15, one week post atherectomy revealed ABIs in the midpoint 6 range bilaterally with a patent right SFA. She does continue to complain of claudication and for most part is minimally ambulatory.

## 2015-12-18 NOTE — Patient Instructions (Signed)
Medication Instructions:  Your physician recommends that you continue on your current medications as directed. Please refer to the Current Medication list given to you today.   Labwork: none  Testing/Procedures: none  Follow-Up: Your physician wants you to follow-up in: 6 months with Dr. Berry. You will receive a reminder letter in the mail two months in advance. If you don't receive a letter, please call our office to schedule the follow-up appointment.   Any Other Special Instructions Will Be Listed Below (If Applicable).     If you need a refill on your cardiac medications before your next appointment, please call your pharmacy.   

## 2015-12-18 NOTE — Assessment & Plan Note (Signed)
She has being taken care of for this by Dr. Marigene Ehlers. Ejection fraction is 45%. She did have intermittent or lateral hypokinesia with grade 2 diastolic dysfunction. She was recently hospitalized because of respiratory failure related to A. Fib with RVR and was intubated. She admits to dietary indiscretion with high salt load and is on diuretics.

## 2015-12-18 NOTE — Assessment & Plan Note (Signed)
On home O2 with history of tobacco abuse

## 2015-12-18 NOTE — Progress Notes (Signed)
12/18/2015 [name]   October 04, 1961  [id number]  Primary Physician Sandi Mariscal, MD Primary Cardiologist: [name] MD [place], [place]   HPI:  [name] is a 55 year old moderately overweight single Caucasian female mother of one child who lives with her sister who accompanies her today. She was referred by Dr. Franki Monte at [facility] for peripheral vascular evaluation because of critical limb ischemia. I last saw her in the office 09/29/15. She sees Dr. Jenkins Rouge at [facility] for cardiomyopathy and paroxysmal atrial fibrillation. Her cardiac risk factors include a 20-pack-year history of tobacco abuse (she has stopped smoking), treated diabetes, and hypertension as well as family history of heart disease. She's never had a heart attack or stroke. She does have COPD. She had paroxysmal atrial fibrillation in the past and has undergone cardioversion in [place].. The lower extremity arterial Doppler studies performed in our office suggested critical limb ischemia with an occluded SFA and popliteal arteries bilaterally with tibial vessel disease as well. She complains of lifestyle limiting claudication. I performed lower extremity angiography on 09/10/13 revealing occluded SFAs bilaterally with chronic occluded tibial vessels as well. I performed TurboHawk directional atherectomy of her entire right SFA with an excellent angiographic result removing a copious amount of atherosclerotic plaque. The ulcer on her right heel ultimately healed. She does continue to smoke one pack per day. She was recently admitted to [facility] because of atrial fibrillation with rapid ventricular response. She underwent cardiac catheterization on 10/31/14 revealing an ejection fraction of 30-35% with disease that was ultimately treated medically. Her carotid Doppler showed high-grade bilateral ICA disease and lower extremity Dopplers revealed restenosis within the right SFA with a  ABI of 0.34. She is symptomatic on that side. I performed angiography on her 01/15/15 revealing an occluded right internal carotid artery, 95% proximal left internal carotid artery stenosis, long 99% stenosis within the right SFA and an occluded left SFA. I performed [facility] 1 directional atherectomy and drug-eluting balloon angioplasty of her right SFA with an excellent Angiographic and clinical result. She underwent left carotid endarterectomy by Dr. Trula Slade on 02/19/15 which was uncomplicated. Since I saw her 6 months ago she's been remaining clinically stable. She denies chest pain or shortness of breath. She continues to have claudication.Lower extremity Doppler studies performed 01/23/15 revealed ABIs in the 0.6 range bilaterally with a patent right SFA. She continues to complain of claudication however.   Current Outpatient Prescriptions  Medication Sig Dispense Refill  . acetaminophen (TYLENOL) 500 MG tablet Take 1,000 mg by mouth every 6 (six) hours as needed (pain).    [name] acetaminophen-codeine (TYLENOL #3) 300-30 MG tablet Take 1 tablet by mouth every 4 (four) hours as needed for moderate pain.    [name] albuterol (PROVENTIL HFA;VENTOLIN HFA) 108 (90 BASE) MCG/ACT inhaler Inhale 2 puffs into the lungs every 6 (six) hours as needed for wheezing or shortness of breath. 1 each 3  . albuterol (PROVENTIL) (2.5 MG/3ML) 0.083% nebulizer solution Take 3 mLs (2.5 mg total) by nebulization every 6 (six) hours as needed. For wheezing. 75 mL 2  . amiodarone (PACERONE) 200 MG tablet Take 1 tablet (200 mg total) by mouth 2 (two) times daily. 60 tablet 6  . atorvastatin (LIPITOR) 40 MG tablet Take 1 tablet (40 mg total) by mouth daily. 30 tablet 2  . bisoprolol (ZEBETA) 5 MG tablet Take 0.5 tablets (2.5 mg total) by mouth daily. 15 tablet 3  . Blood Glucose Monitoring  Suppl (ACCU-CHEK AVIVA PLUS) W/DEVICE KIT 1 Device by Does not apply route 4 (four) times daily -  before meals and at bedtime. 1 kit 0  .  budesonide-formoterol (SYMBICORT) 160-4.5 MCG/ACT inhaler Inhale 2 puffs into the lungs 2 (two) times daily. 1 Inhaler 12  . Cholecalciferol (VITAMIN D) 2000 UNITS tablet Take 2,000 Units by mouth daily.    . folic acid (FOLVITE) 1 MG tablet Take 1 tablet (1 mg total) by mouth daily. 30 tablet 5  . Gabapentin, Once-Daily, (GRALISE) 300 MG TABS Take 300 mg by mouth 3 (three) times daily. 270 tablet 3  . glipiZIDE (GLUCOTROL XL) 10 MG 24 hr tablet Take 2 tablets (20 mg total) by mouth daily with breakfast. 60 tablet 3  . glucose blood (ACCU-CHEK AVIVA PLUS) test strip Use as instructed 100 each 12  . insulin aspart (NOVOLOG) 100 UNIT/ML FlexPen Inject 6-14 Units into the skin 3 (three) times daily with meals. Sliding scale 15 mL 11  . insulin glargine (LANTUS) 100 unit/mL SOPN Inject 0.7 mLs (70 Units total) into the skin at bedtime. 15 mL 11  . Insulin Pen Needle (ULTICARE MICRO PEN NEEDLES) 32G X 4 MM MISC 1 Syringe by Does not apply route 4 (four) times daily - after meals and at bedtime. 1 each 11  . Lancets (ACCU-CHEK SOFT TOUCH) lancets Use as instructed 100 each 12  . levothyroxine (SYNTHROID, LEVOTHROID) 50 MCG tablet Take 1 tablet (50 mcg total) by mouth daily. 30 tablet 2  . nitroGLYCERIN (NITROSTAT) 0.4 MG SL tablet Place 1 tablet (0.4 mg total) under the tongue every 5 (five) minutes as needed for chest pain. 25 tablet 3  . pantoprazole (PROTONIX) 40 MG tablet Take 1 tablet (40 mg total) by mouth daily. 30 tablet 2  . rivaroxaban (XARELTO) 20 MG TABS tablet Take 1 tablet (20 mg total) by mouth daily with supper. (Patient taking differently: Take 20 mg by mouth at bedtime. ) 30 tablet 2  . tiotropium (SPIRIVA) 18 MCG inhalation capsule Place 1 capsule (18 mcg total) into inhaler and inhale daily. 30 capsule 12  . torsemide (DEMADEX) 20 MG tablet Take 2 tabs in AM and 1 tab in PM 90 tablet 6  . vitamin B-12 1000 MCG tablet Take 1 tablet (1,000 mcg total) by mouth daily. 30 tablet 5   No  current facility-administered medications for this visit.    No Active Allergies  Social History   Social History  . Marital Status: Divorced    Spouse Name: N/A  . Number of Children: N/A  . Years of Education: N/A   Occupational History  . disabled    Social History Main Topics  . Smoking status: Former Smoker -- 0.00 packs/day for 40 years    Types: E-cigarettes    Quit date: 11/03/2015  . Smokeless tobacco: Former Systems developer  . Alcohol Use: No  . Drug Use: No     Comment: 04/29/2015 "last drug use was ~ 09/08/2013"  . Sexual Activity: No   Other Topics Concern  . Not on file   Social History Narrative   Lives in [place], in [place] with sister.  They are looking to move but don't have a place to go yet.       Review of Systems: General: negative for chills, fever, night sweats or weight changes.  Cardiovascular: negative for chest pain, dyspnea on exertion, edema, orthopnea, palpitations, paroxysmal nocturnal dyspnea or shortness of breath Dermatological: negative for rash Respiratory: negative for cough or  wheezing Urologic: negative for hematuria Abdominal: negative for nausea, vomiting, diarrhea, bright red blood per rectum, melena, or hematemesis Neurologic: negative for visual changes, syncope, or dizziness All other systems reviewed and are otherwise negative except as noted above.    Blood pressure 130/62, pulse 70, height '5\' 4"'  (1.626 m), weight 235 lb (106.595 kg), last menstrual period 11/27/2012.  General appearance: alert and no distress Neck: no adenopathy, no carotid bruit, no JVD, supple, symmetrical, trachea midline and thyroid not enlarged, symmetric, no tenderness/mass/nodules Lungs: clear to auscultation bilaterally Abdomen: soft, non-tender; bowel sounds normal; no masses,  no organomegaly  EKG not performed today  ASSESSMENT AND PLAN:   Chronic diastolic CHF (congestive heart failure) ([place]) She has being taken care of for this by Dr. Marigene Ehlers.  Ejection fraction is 45%. She did have intermittent or lateral hypokinesia with grade 2 diastolic dysfunction. She was recently hospitalized because of respiratory failure related to A. Fib with RVR and was intubated. She admits to dietary indiscretion with high salt load and is on diuretics.  COPD (chronic obstructive pulmonary disease) ([name]) On home O2 with history of tobacco abuse  S/P peripheral artery angioplasty - TurboHawk atherectomy; R SFA History of peripheral arterial disease with known occluded left SFA and recent right SFA re-intervention by myself 01/15/15 with directional atherectomy and drug-eluting stenting. She has 1 vessel runoff bilaterally with a patent peroneal on the right and anterior tibial on the left. Her most recent lower extremity arterial Doppler studies performed 01/23/15, one week post atherectomy revealed ABIs in the midpoint 6 range bilaterally with a patent right SFA. She does continue to complain of claudication and for most part is minimally ambulatory.  Atrial fibrillation with RVR ([name]) History of PAF maintaining sinus rhythm on amiodarone. It is not clear to me why she is not on oral anticoagulant.  Carotid arterial disease ([name]) History of carotid artery disease status post angiography by myself 01/15/15  Revealing an occluded right internal carotid artery with a 95% proximal left internal carotid artery stenosis. She underwent elective left carotid endarterectomy by Dr. Trula Slade on 02/19/15 which was uncomplicated. Follow-up carotid Dopplers revealed this to be widely patent.      [name] MD FACP,FACC,FAHA, [facility] 12/18/2015 12:16 PM

## 2015-12-18 NOTE — Assessment & Plan Note (Signed)
History of PAF maintaining sinus rhythm on amiodarone. It is not clear to me why she is not on oral anticoagulant.

## 2015-12-18 NOTE — Assessment & Plan Note (Signed)
History of carotid artery disease status post angiography by myself 01/15/15  Revealing an occluded right internal carotid artery with a 95% proximal left internal carotid artery stenosis. She underwent elective left carotid endarterectomy by Dr. Trula Slade on 02/19/15 which was uncomplicated. Follow-up carotid Dopplers revealed this to be widely patent.

## 2015-12-21 ENCOUNTER — Encounter (HOSPITAL_COMMUNITY): Payer: Medicare Other

## 2015-12-21 NOTE — Progress Notes (Signed)
Patient ID: [name], female   DOB: October 16, 1961, 55 y.o.   MRN: [id number]    Advanced Heart Failure Clinic Note   PCP: Dr Roxy Manns HF Cardiology: Dr. Aundra Dubin PV: Dr. Gwenlyn Found  55 yo with history of PAD, carotid stenosis s/p left CEA, relatively mild CAD, chronic systolic CHF, paroxysmal atrial fibrillation and prior substance abuse presents for CHF clinic followup.  She was admitted in 1/17 with acute hypoxemic respiratory failure in the setting of atrial fibrillation/RVR and volume overload.  She was initially intubated.  She converted back to NSR with amiodarone gtt.  She was treated with IV Lasix, steroids, bronchodilators.  She developed AKI and losartan was stopped.  She is now at home with her sister.  She returns today for regular HF follow up. At last visit torsemide doubled for two days and then put on increased dose of 30m qan/267mqpm daily. Feels about the same. Watching her diet better now. Feels like she drinks near 2 L but probably not over.  No SOB with ADLs but does get short of breath after walking 100-200 feet.  Walking with PT, several times a week. Not smoking, no ETOH or drugs. On Chronic 02 at home via [place] 2 lpm.  No chest pain. Still having claudication like symptoms in the right Right thigh/calf pain. Had ABIs today, results pending.    Labs (1/17): K 5.2, creatinine 1.4  PMH: 1. Carotid stenosis: Known occluded right carotid.  Left CEA in 4/16.  2. CAD: LHC in 12/15 with 80% stenosis in small OM1, nonobstructive disease in other territories.  3. Chronic systolic CHF: Nonischemic cardiomyopathy (?due to ETOH or prior drug abuse).  - Echo (1/17) with EF 45%, mild LV hypertrophy, moderate diastolic dysfunction, inferolateral severe hypokinesis, mildly decreased RV systolic function.  4. Atrial fibrillation: Paroxysmal.   5. Type II diabetes 6. CKD: Stage III. 7. COPD: Quit smoking 1/17. On home oxygen.  8. 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  No longer drinks.  9.  Hypothyroidism 10. PAD: Atherectomy SFA in 2014 (Dr BeGwenlyn Found  11. Anemia 12. Prior cocaine abuse.   SH: Lives with sister.  Prior cocaine abuse.  Prior ETOH abuse.  Quit smoking in 1/17.  FH: CAD  ROS: All systems reviewed and negative except as per HPI.   Current Outpatient Prescriptions  Medication Sig Dispense Refill  . acetaminophen (TYLENOL) 500 MG tablet Take 1,000 mg by mouth every 6 (six) hours as needed (pain).    . Marland Kitchencetaminophen-codeine (TYLENOL #3) 300-30 MG tablet Take 1 tablet by mouth every 4 (four) hours as needed for moderate pain.    . Marland Kitchenlbuterol (PROVENTIL HFA;VENTOLIN HFA) 108 (90 BASE) MCG/ACT inhaler Inhale 2 puffs into the lungs every 6 (six) hours as needed for wheezing or shortness of breath. 1 each 3  . albuterol (PROVENTIL) (2.5 MG/3ML) 0.083% nebulizer solution Take 3 mLs (2.5 mg total) by nebulization every 6 (six) hours as needed. For wheezing. 75 mL 2  . amiodarone (PACERONE) 200 MG tablet Take 1 tablet (200 mg total) by mouth 2 (two) times daily. 60 tablet 6  . atorvastatin (LIPITOR) 40 MG tablet Take 1 tablet (40 mg total) by mouth daily. 30 tablet 2  . bisoprolol (ZEBETA) 5 MG tablet Take 0.5 tablets (2.5 mg total) by mouth daily. 15 tablet 3  . Blood Glucose Monitoring Suppl (ACCU-CHEK AVIVA PLUS) W/DEVICE KIT 1 Device by Does not apply route 4 (four) times daily -  before meals and at bedtime. 1 kit  0  . budesonide-formoterol (SYMBICORT) 160-4.5 MCG/ACT inhaler Inhale 2 puffs into the lungs 2 (two) times daily. 1 Inhaler 12  . Cholecalciferol (VITAMIN D) 2000 UNITS tablet Take 2,000 Units by mouth daily.    . folic acid (FOLVITE) 1 MG tablet Take 1 tablet (1 mg total) by mouth daily. 30 tablet 5  . Gabapentin, Once-Daily, (GRALISE) 300 MG TABS Take 300 mg by mouth 3 (three) times daily. 270 tablet 3  . glipiZIDE (GLUCOTROL XL) 10 MG 24 hr tablet Take 2 tablets (20 mg total) by mouth daily with breakfast. 60 tablet 3  . glucose blood (ACCU-CHEK AVIVA PLUS)  test strip Use as instructed 100 each 12  . insulin aspart (NOVOLOG) 100 UNIT/ML FlexPen Inject 6-14 Units into the skin 3 (three) times daily with meals. Sliding scale 15 mL 11  . insulin glargine (LANTUS) 100 unit/mL SOPN Inject 0.7 mLs (70 Units total) into the skin at bedtime. 15 mL 11  . Insulin Pen Needle (ULTICARE MICRO PEN NEEDLES) 32G X 4 MM MISC 1 Syringe by Does not apply route 4 (four) times daily - after meals and at bedtime. 1 each 11  . Lancets (ACCU-CHEK SOFT TOUCH) lancets Use as instructed 100 each 12  . levothyroxine (SYNTHROID, LEVOTHROID) 50 MCG tablet Take 1 tablet (50 mcg total) by mouth daily. 30 tablet 2  . nitroGLYCERIN (NITROSTAT) 0.4 MG SL tablet Place 1 tablet (0.4 mg total) under the tongue every 5 (five) minutes as needed for chest pain. 25 tablet 3  . pantoprazole (PROTONIX) 40 MG tablet Take 1 tablet (40 mg total) by mouth daily. 30 tablet 2  . rivaroxaban (XARELTO) 20 MG TABS tablet Take 1 tablet (20 mg total) by mouth daily with supper. (Patient taking differently: Take 20 mg by mouth at bedtime. ) 30 tablet 2  . tiotropium (SPIRIVA) 18 MCG inhalation capsule Place 1 capsule (18 mcg total) into inhaler and inhale daily. 30 capsule 12  . torsemide (DEMADEX) 20 MG tablet Take 2 tabs in AM and 1 tab in PM 90 tablet 6  . vitamin B-12 1000 MCG tablet Take 1 tablet (1,000 mcg total) by mouth daily. 30 tablet 5   No current facility-administered medications for this encounter.    Filed Vitals:   12/22/15 1446  BP: 152/90  Pulse: 70  Weight: 236 lb (107.049 kg)  SpO2: 97%   Wt Readings from Last 3 Encounters:  12/22/15 236 lb (107.049 kg)  12/18/15 235 lb (106.595 kg)  12/14/15 237 lb 12 oz (107.843 kg)     General: NAD Neck: Thick, JVP 8-9 cm, no thyromegaly or thyroid nodule.  Lungs: Distant breath sounds bilaterally.  CV: Nondisplaced PMI.  Heart regular S1/S2, no S3/S4, no murmur.  1+ edema to knees bilaterally.  No carotid bruit.  Unable to palpate  pedal pulses.  Abdomen: Soft, nontender, no hepatosplenomegaly, no distention.  Skin: Intact without lesions or rashes.  Neurologic: Alert and oriented x 3.  Psych: Normal affect. Extremities: No clubbing or cyanosis.  HEENT: Normal.   Assessment/Plan: 1. Chronic systolic CHF: EF 60% on last echo. Has been presumed nonischemic given LHC in 12/15 showing only 80% stenosis in small OM1. Recent admission with acute on chronic systolic CHF likely triggered by atrial fibrillation/RVR + dietary indiscretion.   - She remains mildly mildly volume overloaded with NYHA class III symptoms.  - Increase torsemide to 40 mg BID.  - BMET/BNP today with increased torsemide last week. Recheck in 10 days.  - Continue  bisoprolol 2.5 mg daily.  - Restart lisinopril 10 mg daily.  - Wear compression stockings.  - Discussed importance of daily weights, limiting fluid and sodium, and calling HF clinic if weight increases by 3 lbs overnight or 5 lbs within 1 week.  2. Atrial fibrillation: Now in NSR on amiodarone.  She is on Xarelto 20 mg daily.  - Decrease amio to 200 daily on 12/25/15. - Will check T3/T4 and recheck TSH today.  - She will need regular eye exams while on amiodarone.  2. PAD: Right thigh/calf claudication, difficult to palpate pulses.  - had ABIs today. Results pending.  3. COPD: On home oxygen.  Has quit smoking, needs to stay off cigarettes.  4. H/o cirrhosis: Likely from ETOH, no longer drinks.   Has follow up with Dr. Aundra Dubin in 3 weeks. BMET/BNP, TSH, T3, T4 today. Recheck BMET 10 days. Med changes as above.   Satira Mccallum Tillery PA-C 12/21/2015

## 2015-12-22 ENCOUNTER — Ambulatory Visit (HOSPITAL_COMMUNITY)
Admission: RE | Admit: 2015-12-22 | Discharge: 2015-12-22 | Disposition: A | Discharge status: Home / Self Care | Payer: Medicare Other | Source: Ambulatory Visit | Attending: Internal Medicine | Admitting: Internal Medicine

## 2015-12-22 ENCOUNTER — Ambulatory Visit (HOSPITAL_COMMUNITY)
Admission: RE | Admit: 2015-12-22 | Discharge: 2015-12-22 | Disposition: A | Discharge status: Home / Self Care | Payer: Medicare Other | Source: Ambulatory Visit | Attending: Cardiology | Admitting: Cardiology

## 2015-12-22 VITALS — BP 152/90 | HR 70 | Wt 236.0 lb

## 2015-12-22 DIAGNOSIS — I4891 Unspecified atrial fibrillation: Secondary | ICD-10-CM

## 2015-12-22 DIAGNOSIS — N183 Chronic kidney disease, stage 3 (moderate): Secondary | ICD-10-CM | POA: Insufficient documentation

## 2015-12-22 DIAGNOSIS — Z87891 Personal history of nicotine dependence: Secondary | ICD-10-CM | POA: Insufficient documentation

## 2015-12-22 DIAGNOSIS — I5032 Chronic diastolic (congestive) heart failure: Secondary | ICD-10-CM | POA: Diagnosis not present

## 2015-12-22 DIAGNOSIS — Z8249 Family history of ischemic heart disease and other diseases of the circulatory system: Secondary | ICD-10-CM | POA: Insufficient documentation

## 2015-12-22 DIAGNOSIS — I480 Paroxysmal atrial fibrillation: Secondary | ICD-10-CM | POA: Insufficient documentation

## 2015-12-22 DIAGNOSIS — Z79899 Other long term (current) drug therapy: Secondary | ICD-10-CM | POA: Diagnosis not present

## 2015-12-22 DIAGNOSIS — I2510 Atherosclerotic heart disease of native coronary artery without angina pectoris: Secondary | ICD-10-CM | POA: Insufficient documentation

## 2015-12-22 DIAGNOSIS — K7460 Unspecified cirrhosis of liver: Secondary | ICD-10-CM | POA: Diagnosis not present

## 2015-12-22 DIAGNOSIS — I10 Essential (primary) hypertension: Secondary | ICD-10-CM | POA: Insufficient documentation

## 2015-12-22 DIAGNOSIS — R062 Wheezing: Secondary | ICD-10-CM

## 2015-12-22 DIAGNOSIS — I5022 Chronic systolic (congestive) heart failure: Secondary | ICD-10-CM | POA: Insufficient documentation

## 2015-12-22 DIAGNOSIS — J449 Chronic obstructive pulmonary disease, unspecified: Secondary | ICD-10-CM | POA: Diagnosis not present

## 2015-12-22 DIAGNOSIS — I428 Other cardiomyopathies: Secondary | ICD-10-CM | POA: Insufficient documentation

## 2015-12-22 DIAGNOSIS — I5043 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) and diastolic (congestive) heart failure: Secondary | ICD-10-CM

## 2015-12-22 DIAGNOSIS — I739 Peripheral vascular disease, unspecified: Secondary | ICD-10-CM | POA: Diagnosis not present

## 2015-12-22 DIAGNOSIS — E1122 Type 2 diabetes mellitus with diabetic chronic kidney disease: Secondary | ICD-10-CM | POA: Insufficient documentation

## 2015-12-22 DIAGNOSIS — Z794 Long term (current) use of insulin: Secondary | ICD-10-CM | POA: Diagnosis not present

## 2015-12-22 DIAGNOSIS — E039 Hypothyroidism, unspecified: Secondary | ICD-10-CM | POA: Diagnosis not present

## 2015-12-22 DIAGNOSIS — Z7902 Long term (current) use of antithrombotics/antiplatelets: Secondary | ICD-10-CM | POA: Insufficient documentation

## 2015-12-22 DIAGNOSIS — I152 Hypertension secondary to endocrine disorders: Secondary | ICD-10-CM | POA: Insufficient documentation

## 2015-12-22 DIAGNOSIS — Z9981 Dependence on supplemental oxygen: Secondary | ICD-10-CM | POA: Insufficient documentation

## 2015-12-22 LAB — BRAIN NATRIURETIC PEPTIDE: B Natriuretic Peptide: 607.2 pg/mL — ABNORMAL HIGH (ref 0.0–100.0)

## 2015-12-22 LAB — BASIC METABOLIC PANEL
ANION GAP: 9 (ref 5–15)
BUN: 32 mg/dL — ABNORMAL HIGH (ref 6–20)
CHLORIDE: 99 mmol/L — AB (ref 101–111)
CO2: 34 mmol/L — AB (ref 22–32)
Calcium: 9.3 mg/dL (ref 8.9–10.3)
Creatinine, Ser: 1.16 mg/dL — ABNORMAL HIGH (ref 0.44–1.00)
GFR calc Af Amer: >60 mL/min (ref >60)
GFR calc non Af Amer: 53 mL/min — ABNORMAL LOW (ref >60)
GLUCOSE: 142 mg/dL — AB (ref 65–99)
POTASSIUM: 3.9 mmol/L (ref 3.5–5.1)
Sodium: 142 mmol/L (ref 135–145)

## 2015-12-22 LAB — T4, FREE: Free T4: 1.09 ng/dL (ref 0.61–1.12)

## 2015-12-22 LAB — TSH: TSH: 5.836 u[IU]/mL — ABNORMAL HIGH (ref 0.350–4.500)

## 2015-12-22 MED ORDER — AMIODARONE HCL 200 MG PO TABS: 200.0000 mg | ORAL_TABLET | Freq: Every day | ORAL | Status: DC

## 2015-12-22 MED ORDER — TORSEMIDE 20 MG PO TABS: 40.0000 mg | ORAL_TABLET | Freq: Two times a day (BID) | ORAL | Status: DC

## 2015-12-22 MED ORDER — LISINOPRIL 10 MG PO TABS: 10.0000 mg | ORAL_TABLET | Freq: Every day | ORAL | Status: DC

## 2015-12-22 NOTE — Patient Instructions (Signed)
DECREASED Amiodarone to 200 mg, one tab daily staring on 12/25/15 INCREASE Torsemide to 40 mg (2 tabs) twice a day RESTART Lisinopril 10 mg , one tab daily  Labs today and again in 10 days  Your physician recommends that you schedule a follow-up appointment in: 3 weeks with Dr.McLean as scheduled

## 2015-12-23 ENCOUNTER — Encounter (HOSPITAL_COMMUNITY): Payer: Medicare Other

## 2015-12-23 LAB — T3, FREE: T3, Free: 1.9 pg/mL — ABNORMAL LOW (ref 2.0–4.4)

## 2015-12-24 NOTE — Progress Notes (Signed)
Dalton, it looks like she has occluded her RSFA again. I'll see her back in the office to discuss  Thx

## 2015-12-25 ENCOUNTER — Other Ambulatory Visit (HOSPITAL_COMMUNITY): Payer: Medicare Other

## 2015-12-25 ENCOUNTER — Encounter (HOSPITAL_COMMUNITY): Payer: Medicare Other

## 2015-12-28 ENCOUNTER — Encounter (HOSPITAL_COMMUNITY): Payer: Medicare Other

## 2015-12-30 ENCOUNTER — Encounter (HOSPITAL_COMMUNITY): Payer: Medicare Other

## 2016-01-01 ENCOUNTER — Ambulatory Visit (HOSPITAL_COMMUNITY)
Admission: RE | Admit: 2016-01-01 | Discharge: 2016-01-01 | Disposition: A | Discharge status: Home / Self Care | Payer: Medicare Other | Source: Ambulatory Visit | Attending: Cardiology | Admitting: Cardiology

## 2016-01-01 ENCOUNTER — Encounter (HOSPITAL_COMMUNITY): Payer: Medicare Other

## 2016-01-01 DIAGNOSIS — I5043 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) and diastolic (congestive) heart failure: Secondary | ICD-10-CM | POA: Diagnosis not present

## 2016-01-01 LAB — BASIC METABOLIC PANEL
ANION GAP: 9 (ref 5–15)
BUN: 40 mg/dL — AB (ref 6–20)
CHLORIDE: 97 mmol/L — AB (ref 101–111)
CO2: 32 mmol/L (ref 22–32)
Calcium: 9.3 mg/dL (ref 8.9–10.3)
Creatinine, Ser: 1.3 mg/dL — ABNORMAL HIGH (ref 0.44–1.00)
GFR calc Af Amer: 53 mL/min — ABNORMAL LOW (ref >60)
GFR calc non Af Amer: 46 mL/min — ABNORMAL LOW (ref >60)
GLUCOSE: 236 mg/dL — AB (ref 65–99)
POTASSIUM: 4.2 mmol/L (ref 3.5–5.1)
SODIUM: 138 mmol/L (ref 135–145)

## 2016-01-04 ENCOUNTER — Ambulatory Visit: Payer: Medicare Other | Admitting: Internal Medicine

## 2016-01-04 ENCOUNTER — Encounter (HOSPITAL_COMMUNITY): Payer: Medicare Other

## 2016-01-06 ENCOUNTER — Encounter (HOSPITAL_COMMUNITY): Payer: Medicare Other

## 2016-01-07 ENCOUNTER — Emergency Department (HOSPITAL_COMMUNITY)
Admission: EM | Admit: 2016-01-07 | Discharge: 2016-01-07 | Disposition: A | Discharge status: Home / Self Care | Payer: No Typology Code available for payment source | Attending: Emergency Medicine | Admitting: Emergency Medicine

## 2016-01-07 ENCOUNTER — Encounter (HOSPITAL_COMMUNITY): Payer: Self-pay | Admitting: Emergency Medicine

## 2016-01-07 ENCOUNTER — Emergency Department (HOSPITAL_COMMUNITY): Payer: No Typology Code available for payment source

## 2016-01-07 DIAGNOSIS — Z9119 Patient's noncompliance with other medical treatment and regimen: Secondary | ICD-10-CM | POA: Insufficient documentation

## 2016-01-07 DIAGNOSIS — I10 Essential (primary) hypertension: Secondary | ICD-10-CM | POA: Insufficient documentation

## 2016-01-07 DIAGNOSIS — Y92410 Unspecified street and highway as the place of occurrence of the external cause: Secondary | ICD-10-CM | POA: Insufficient documentation

## 2016-01-07 DIAGNOSIS — E1142 Type 2 diabetes mellitus with diabetic polyneuropathy: Secondary | ICD-10-CM | POA: Diagnosis not present

## 2016-01-07 DIAGNOSIS — M25562 Pain in left knee: Secondary | ICD-10-CM

## 2016-01-07 DIAGNOSIS — M25561 Pain in right knee: Secondary | ICD-10-CM

## 2016-01-07 DIAGNOSIS — I5042 Chronic combined systolic (congestive) and diastolic (congestive) heart failure: Secondary | ICD-10-CM | POA: Diagnosis not present

## 2016-01-07 DIAGNOSIS — Y998 Other external cause status: Secondary | ICD-10-CM | POA: Diagnosis not present

## 2016-01-07 DIAGNOSIS — Z7901 Long term (current) use of anticoagulants: Secondary | ICD-10-CM | POA: Diagnosis not present

## 2016-01-07 DIAGNOSIS — Y9389 Activity, other specified: Secondary | ICD-10-CM | POA: Diagnosis not present

## 2016-01-07 DIAGNOSIS — Z9981 Dependence on supplemental oxygen: Secondary | ICD-10-CM | POA: Insufficient documentation

## 2016-01-07 DIAGNOSIS — Z9889 Other specified postprocedural states: Secondary | ICD-10-CM | POA: Insufficient documentation

## 2016-01-07 DIAGNOSIS — E785 Hyperlipidemia, unspecified: Secondary | ICD-10-CM | POA: Insufficient documentation

## 2016-01-07 DIAGNOSIS — Z7951 Long term (current) use of inhaled steroids: Secondary | ICD-10-CM | POA: Insufficient documentation

## 2016-01-07 DIAGNOSIS — I480 Paroxysmal atrial fibrillation: Secondary | ICD-10-CM | POA: Insufficient documentation

## 2016-01-07 DIAGNOSIS — E039 Hypothyroidism, unspecified: Secondary | ICD-10-CM | POA: Insufficient documentation

## 2016-01-07 DIAGNOSIS — Z794 Long term (current) use of insulin: Secondary | ICD-10-CM | POA: Insufficient documentation

## 2016-01-07 DIAGNOSIS — Z87891 Personal history of nicotine dependence: Secondary | ICD-10-CM | POA: Diagnosis not present

## 2016-01-07 DIAGNOSIS — K219 Gastro-esophageal reflux disease without esophagitis: Secondary | ICD-10-CM | POA: Diagnosis not present

## 2016-01-07 DIAGNOSIS — E669 Obesity, unspecified: Secondary | ICD-10-CM | POA: Diagnosis not present

## 2016-01-07 DIAGNOSIS — Z79899 Other long term (current) drug therapy: Secondary | ICD-10-CM | POA: Insufficient documentation

## 2016-01-07 DIAGNOSIS — S8991XA Unspecified injury of right lower leg, initial encounter: Secondary | ICD-10-CM | POA: Insufficient documentation

## 2016-01-07 DIAGNOSIS — S8992XA Unspecified injury of left lower leg, initial encounter: Secondary | ICD-10-CM | POA: Diagnosis not present

## 2016-01-07 DIAGNOSIS — F419 Anxiety disorder, unspecified: Secondary | ICD-10-CM | POA: Diagnosis not present

## 2016-01-07 MED ORDER — HYDROCODONE-ACETAMINOPHEN 5-325 MG PO TABS: 1.0000 | ORAL_TABLET | Freq: Four times a day (QID) | ORAL | Status: DC | PRN

## 2016-01-07 MED ORDER — IBUPROFEN 200 MG PO TABS
600.0000 mg | ORAL_TABLET | Freq: Once | ORAL | Status: AC
  Administered 2016-01-07: 600 mg via ORAL
  Filled 2016-01-07: qty 3

## 2016-01-07 MED ORDER — HYDROCODONE-ACETAMINOPHEN 5-325 MG PO TABS
1.0000 | ORAL_TABLET | Freq: Once | ORAL | Status: AC
  Administered 2016-01-07: 1 via ORAL
  Filled 2016-01-07: qty 1

## 2016-01-07 MED ORDER — NAPROXEN 500 MG PO TABS: 500.0000 mg | ORAL_TABLET | Freq: Two times a day (BID) | ORAL | Status: DC

## 2016-01-07 MED ORDER — METHOCARBAMOL 500 MG PO TABS: 500.0000 mg | ORAL_TABLET | Freq: Two times a day (BID) | ORAL | Status: DC
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
## 2016-01-07 NOTE — ED Notes (Signed)
Patient transported to X-ray 
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
## 2016-01-08 ENCOUNTER — Encounter (HOSPITAL_COMMUNITY): Payer: Medicare Other

## 2016-01-11 ENCOUNTER — Ambulatory Visit (HOSPITAL_BASED_OUTPATIENT_CLINIC_OR_DEPARTMENT_OTHER)
Admission: RE | Admit: 2016-01-11 | Discharge: 2016-01-11 | Disposition: A | Discharge status: Home / Self Care | Payer: Medicare Other | Source: Ambulatory Visit | Attending: Cardiology | Admitting: Cardiology

## 2016-01-11 ENCOUNTER — Encounter (HOSPITAL_COMMUNITY): Payer: Self-pay

## 2016-01-11 VITALS — BP 140/80 | HR 62 | Wt 242.2 lb

## 2016-01-11 DIAGNOSIS — I5023 Acute on chronic systolic (congestive) heart failure: Secondary | ICD-10-CM | POA: Diagnosis not present

## 2016-01-11 DIAGNOSIS — I2510 Atherosclerotic heart disease of native coronary artery without angina pectoris: Secondary | ICD-10-CM | POA: Diagnosis present

## 2016-01-11 DIAGNOSIS — I739 Peripheral vascular disease, unspecified: Secondary | ICD-10-CM

## 2016-01-11 DIAGNOSIS — E1122 Type 2 diabetes mellitus with diabetic chronic kidney disease: Secondary | ICD-10-CM | POA: Diagnosis present

## 2016-01-11 DIAGNOSIS — Z7951 Long term (current) use of inhaled steroids: Secondary | ICD-10-CM

## 2016-01-11 DIAGNOSIS — I130 Hypertensive heart and chronic kidney disease with heart failure and stage 1 through stage 4 chronic kidney disease, or unspecified chronic kidney disease: Secondary | ICD-10-CM | POA: Diagnosis present

## 2016-01-11 DIAGNOSIS — N183 Chronic kidney disease, stage 3 (moderate): Secondary | ICD-10-CM | POA: Diagnosis present

## 2016-01-11 DIAGNOSIS — K7030 Alcoholic cirrhosis of liver without ascites: Secondary | ICD-10-CM | POA: Diagnosis present

## 2016-01-11 DIAGNOSIS — I480 Paroxysmal atrial fibrillation: Secondary | ICD-10-CM | POA: Diagnosis not present

## 2016-01-11 DIAGNOSIS — Z79899 Other long term (current) drug therapy: Secondary | ICD-10-CM

## 2016-01-11 DIAGNOSIS — E1142 Type 2 diabetes mellitus with diabetic polyneuropathy: Secondary | ICD-10-CM | POA: Diagnosis present

## 2016-01-11 DIAGNOSIS — I5043 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) and diastolic (congestive) heart failure: Secondary | ICD-10-CM

## 2016-01-11 DIAGNOSIS — N170 Acute kidney failure with tubular necrosis: Secondary | ICD-10-CM | POA: Diagnosis present

## 2016-01-11 DIAGNOSIS — Z8249 Family history of ischemic heart disease and other diseases of the circulatory system: Secondary | ICD-10-CM

## 2016-01-11 DIAGNOSIS — E785 Hyperlipidemia, unspecified: Secondary | ICD-10-CM | POA: Diagnosis present

## 2016-01-11 DIAGNOSIS — I5032 Chronic diastolic (congestive) heart failure: Secondary | ICD-10-CM

## 2016-01-11 DIAGNOSIS — I426 Alcoholic cardiomyopathy: Secondary | ICD-10-CM | POA: Diagnosis present

## 2016-01-11 DIAGNOSIS — E039 Hypothyroidism, unspecified: Secondary | ICD-10-CM | POA: Diagnosis present

## 2016-01-11 DIAGNOSIS — I6521 Occlusion and stenosis of right carotid artery: Secondary | ICD-10-CM | POA: Diagnosis present

## 2016-01-11 DIAGNOSIS — I428 Other cardiomyopathies: Secondary | ICD-10-CM | POA: Diagnosis present

## 2016-01-11 DIAGNOSIS — R42 Dizziness and giddiness: Secondary | ICD-10-CM | POA: Diagnosis not present

## 2016-01-11 DIAGNOSIS — Z7901 Long term (current) use of anticoagulants: Secondary | ICD-10-CM

## 2016-01-11 DIAGNOSIS — E1151 Type 2 diabetes mellitus with diabetic peripheral angiopathy without gangrene: Secondary | ICD-10-CM | POA: Diagnosis present

## 2016-01-11 DIAGNOSIS — R001 Bradycardia, unspecified: Secondary | ICD-10-CM | POA: Diagnosis not present

## 2016-01-11 DIAGNOSIS — Z791 Long term (current) use of non-steroidal anti-inflammatories (NSAID): Secondary | ICD-10-CM

## 2016-01-11 DIAGNOSIS — R748 Abnormal levels of other serum enzymes: Secondary | ICD-10-CM | POA: Diagnosis present

## 2016-01-11 DIAGNOSIS — K219 Gastro-esophageal reflux disease without esophagitis: Secondary | ICD-10-CM | POA: Diagnosis present

## 2016-01-11 DIAGNOSIS — D649 Anemia, unspecified: Secondary | ICD-10-CM | POA: Diagnosis present

## 2016-01-11 DIAGNOSIS — Z9862 Peripheral vascular angioplasty status: Secondary | ICD-10-CM

## 2016-01-11 DIAGNOSIS — J449 Chronic obstructive pulmonary disease, unspecified: Secondary | ICD-10-CM | POA: Diagnosis present

## 2016-01-11 DIAGNOSIS — Z87891 Personal history of nicotine dependence: Secondary | ICD-10-CM

## 2016-01-11 DIAGNOSIS — Z794 Long term (current) use of insulin: Secondary | ICD-10-CM

## 2016-01-11 DIAGNOSIS — E669 Obesity, unspecified: Secondary | ICD-10-CM | POA: Diagnosis present

## 2016-01-11 DIAGNOSIS — Z9981 Dependence on supplemental oxygen: Secondary | ICD-10-CM

## 2016-01-11 DIAGNOSIS — Z6841 Body Mass Index (BMI) 40.0 and over, adult: Secondary | ICD-10-CM

## 2016-01-11 LAB — BASIC METABOLIC PANEL
ANION GAP: 11 (ref 5–15)
BUN: 40 mg/dL — ABNORMAL HIGH (ref 6–20)
CHLORIDE: 104 mmol/L (ref 101–111)
CO2: 26 mmol/L (ref 22–32)
Calcium: 8.3 mg/dL — ABNORMAL LOW (ref 8.9–10.3)
Creatinine, Ser: 1.77 mg/dL — ABNORMAL HIGH (ref 0.44–1.00)
GFR calc non Af Amer: 32 mL/min — ABNORMAL LOW (ref >60)
GFR, EST AFRICAN AMERICAN: 37 mL/min — AB (ref >60)
Glucose, Bld: 368 mg/dL — ABNORMAL HIGH (ref 65–99)
Potassium: 5.2 mmol/L — ABNORMAL HIGH (ref 3.5–5.1)
Sodium: 141 mmol/L (ref 135–145)

## 2016-01-11 LAB — BRAIN NATRIURETIC PEPTIDE: B Natriuretic Peptide: 369.3 pg/mL — ABNORMAL HIGH (ref 0.0–100.0)

## 2016-01-11 MED ORDER — TORSEMIDE 20 MG PO TABS: 60.0000 mg | ORAL_TABLET | Freq: Two times a day (BID) | ORAL | Status: DC

## 2016-01-11 MED ORDER — POTASSIUM CHLORIDE ER 20 MEQ PO TBCR: 20.0000 meq | EXTENDED_RELEASE_TABLET | Freq: Two times a day (BID) | ORAL | Status: DC

## 2016-01-11 NOTE — Patient Instructions (Signed)
Your provider requests you have an appointment with Dr.Jonathan Gwenlyn Found.  Routine lab work today. (bmet bnp) Will notify you of abnormal results  Recheck bmet in 10 days.  Increase Torsemide to 60mg  twice daily.  Start Potassium 55meq twice daily.  Follow up with Dr.McLean in 1 month

## 2016-01-12 ENCOUNTER — Encounter (HOSPITAL_COMMUNITY): Payer: Self-pay | Admitting: Neurology

## 2016-01-12 ENCOUNTER — Ambulatory Visit (HOSPITAL_COMMUNITY): Admit: 2016-01-12 | Payer: Self-pay | Admitting: Cardiology

## 2016-01-12 ENCOUNTER — Inpatient Hospital Stay (HOSPITAL_COMMUNITY): Payer: Medicare Other

## 2016-01-12 ENCOUNTER — Encounter (HOSPITAL_COMMUNITY)
Admission: EM | Disposition: A | Discharge status: Home Health | Payer: Self-pay | Source: Home / Self Care | Attending: Cardiology

## 2016-01-12 ENCOUNTER — Inpatient Hospital Stay (HOSPITAL_COMMUNITY)
Admission: EM | Admit: 2016-01-12 | Discharge: 2016-01-18 | DRG: 308 | Disposition: A | Discharge status: Home Health | Payer: Medicare Other | Attending: Cardiology | Admitting: Cardiology

## 2016-01-12 ENCOUNTER — Emergency Department (HOSPITAL_COMMUNITY): Payer: Medicare Other

## 2016-01-12 ENCOUNTER — Other Ambulatory Visit (HOSPITAL_COMMUNITY): Payer: Self-pay

## 2016-01-12 ENCOUNTER — Telehealth (HOSPITAL_COMMUNITY): Payer: Self-pay

## 2016-01-12 DIAGNOSIS — E039 Hypothyroidism, unspecified: Secondary | ICD-10-CM | POA: Diagnosis present

## 2016-01-12 DIAGNOSIS — I495 Sick sinus syndrome: Secondary | ICD-10-CM | POA: Diagnosis not present

## 2016-01-12 DIAGNOSIS — Z9862 Peripheral vascular angioplasty status: Secondary | ICD-10-CM

## 2016-01-12 DIAGNOSIS — Z79899 Other long term (current) drug therapy: Secondary | ICD-10-CM | POA: Diagnosis not present

## 2016-01-12 DIAGNOSIS — R001 Bradycardia, unspecified: Secondary | ICD-10-CM | POA: Diagnosis not present

## 2016-01-12 DIAGNOSIS — N170 Acute kidney failure with tubular necrosis: Secondary | ICD-10-CM | POA: Diagnosis present

## 2016-01-12 DIAGNOSIS — I426 Alcoholic cardiomyopathy: Secondary | ICD-10-CM | POA: Diagnosis present

## 2016-01-12 DIAGNOSIS — Z8249 Family history of ischemic heart disease and other diseases of the circulatory system: Secondary | ICD-10-CM | POA: Diagnosis not present

## 2016-01-12 DIAGNOSIS — I428 Other cardiomyopathies: Secondary | ICD-10-CM | POA: Diagnosis present

## 2016-01-12 DIAGNOSIS — Z950 Presence of cardiac pacemaker: Secondary | ICD-10-CM

## 2016-01-12 DIAGNOSIS — J449 Chronic obstructive pulmonary disease, unspecified: Secondary | ICD-10-CM | POA: Diagnosis present

## 2016-01-12 DIAGNOSIS — N179 Acute kidney failure, unspecified: Secondary | ICD-10-CM | POA: Diagnosis not present

## 2016-01-12 DIAGNOSIS — I2510 Atherosclerotic heart disease of native coronary artery without angina pectoris: Secondary | ICD-10-CM | POA: Diagnosis present

## 2016-01-12 DIAGNOSIS — I480 Paroxysmal atrial fibrillation: Secondary | ICD-10-CM | POA: Diagnosis present

## 2016-01-12 DIAGNOSIS — I5043 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) and diastolic (congestive) heart failure: Secondary | ICD-10-CM | POA: Diagnosis not present

## 2016-01-12 DIAGNOSIS — E1122 Type 2 diabetes mellitus with diabetic chronic kidney disease: Secondary | ICD-10-CM | POA: Diagnosis present

## 2016-01-12 DIAGNOSIS — R748 Abnormal levels of other serum enzymes: Secondary | ICD-10-CM | POA: Diagnosis present

## 2016-01-12 DIAGNOSIS — I739 Peripheral vascular disease, unspecified: Secondary | ICD-10-CM

## 2016-01-12 DIAGNOSIS — I152 Hypertension secondary to endocrine disorders: Secondary | ICD-10-CM | POA: Diagnosis present

## 2016-01-12 DIAGNOSIS — Z791 Long term (current) use of non-steroidal anti-inflammatories (NSAID): Secondary | ICD-10-CM | POA: Diagnosis not present

## 2016-01-12 DIAGNOSIS — E669 Obesity, unspecified: Secondary | ICD-10-CM | POA: Diagnosis present

## 2016-01-12 DIAGNOSIS — K219 Gastro-esophageal reflux disease without esophagitis: Secondary | ICD-10-CM | POA: Diagnosis present

## 2016-01-12 DIAGNOSIS — I6521 Occlusion and stenosis of right carotid artery: Secondary | ICD-10-CM | POA: Diagnosis present

## 2016-01-12 DIAGNOSIS — N183 Chronic kidney disease, stage 3 (moderate): Secondary | ICD-10-CM | POA: Diagnosis present

## 2016-01-12 DIAGNOSIS — I6529 Occlusion and stenosis of unspecified carotid artery: Secondary | ICD-10-CM | POA: Diagnosis present

## 2016-01-12 DIAGNOSIS — Z794 Long term (current) use of insulin: Secondary | ICD-10-CM | POA: Diagnosis not present

## 2016-01-12 DIAGNOSIS — I10 Essential (primary) hypertension: Secondary | ICD-10-CM | POA: Diagnosis not present

## 2016-01-12 DIAGNOSIS — E1159 Type 2 diabetes mellitus with other circulatory complications: Secondary | ICD-10-CM | POA: Diagnosis present

## 2016-01-12 DIAGNOSIS — I5033 Acute on chronic diastolic (congestive) heart failure: Secondary | ICD-10-CM | POA: Diagnosis present

## 2016-01-12 DIAGNOSIS — Z9981 Dependence on supplemental oxygen: Secondary | ICD-10-CM | POA: Diagnosis not present

## 2016-01-12 DIAGNOSIS — Z7901 Long term (current) use of anticoagulants: Secondary | ICD-10-CM

## 2016-01-12 DIAGNOSIS — E1151 Type 2 diabetes mellitus with diabetic peripheral angiopathy without gangrene: Secondary | ICD-10-CM | POA: Diagnosis present

## 2016-01-12 DIAGNOSIS — I779 Disorder of arteries and arterioles, unspecified: Secondary | ICD-10-CM | POA: Diagnosis present

## 2016-01-12 DIAGNOSIS — N189 Chronic kidney disease, unspecified: Secondary | ICD-10-CM | POA: Diagnosis not present

## 2016-01-12 DIAGNOSIS — I5042 Chronic combined systolic (congestive) and diastolic (congestive) heart failure: Secondary | ICD-10-CM | POA: Diagnosis present

## 2016-01-12 DIAGNOSIS — Z87891 Personal history of nicotine dependence: Secondary | ICD-10-CM | POA: Diagnosis not present

## 2016-01-12 DIAGNOSIS — D649 Anemia, unspecified: Secondary | ICD-10-CM | POA: Diagnosis present

## 2016-01-12 DIAGNOSIS — I481 Persistent atrial fibrillation: Secondary | ICD-10-CM | POA: Diagnosis not present

## 2016-01-12 DIAGNOSIS — E1169 Type 2 diabetes mellitus with other specified complication: Secondary | ICD-10-CM | POA: Diagnosis present

## 2016-01-12 DIAGNOSIS — K7030 Alcoholic cirrhosis of liver without ascites: Secondary | ICD-10-CM | POA: Diagnosis present

## 2016-01-12 DIAGNOSIS — E785 Hyperlipidemia, unspecified: Secondary | ICD-10-CM | POA: Diagnosis present

## 2016-01-12 DIAGNOSIS — R42 Dizziness and giddiness: Secondary | ICD-10-CM | POA: Diagnosis present

## 2016-01-12 DIAGNOSIS — Z6841 Body Mass Index (BMI) 40.0 and over, adult: Secondary | ICD-10-CM | POA: Diagnosis not present

## 2016-01-12 DIAGNOSIS — Z7951 Long term (current) use of inhaled steroids: Secondary | ICD-10-CM | POA: Diagnosis not present

## 2016-01-12 DIAGNOSIS — I130 Hypertensive heart and chronic kidney disease with heart failure and stage 1 through stage 4 chronic kidney disease, or unspecified chronic kidney disease: Secondary | ICD-10-CM | POA: Diagnosis present

## 2016-01-12 DIAGNOSIS — E119 Type 2 diabetes mellitus without complications: Secondary | ICD-10-CM | POA: Diagnosis present

## 2016-01-12 DIAGNOSIS — E1142 Type 2 diabetes mellitus with diabetic polyneuropathy: Secondary | ICD-10-CM | POA: Diagnosis present

## 2016-01-12 HISTORY — PX: CARDIAC CATHETERIZATION: SHX172

## 2016-01-12 LAB — HEPATIC FUNCTION PANEL
ALK PHOS: 199 U/L — AB (ref 38–126)
ALT: 67 U/L — ABNORMAL HIGH (ref 14–54)
AST: 40 U/L (ref 15–41)
Albumin: 2.8 g/dL — ABNORMAL LOW (ref 3.5–5.0)
BILIRUBIN TOTAL: 0.5 mg/dL (ref 0.3–1.2)
TOTAL PROTEIN: 6.4 g/dL — AB (ref 6.5–8.1)

## 2016-01-12 LAB — CBC WITH DIFFERENTIAL/PLATELET
BASOS ABS: 0 10*3/uL (ref 0.0–0.1)
BASOS PCT: 0 %
EOS ABS: 0.3 10*3/uL (ref 0.0–0.7)
EOS PCT: 3 %
HCT: 30.2 % — ABNORMAL LOW (ref 36.0–46.0)
Hemoglobin: 9.1 g/dL — ABNORMAL LOW (ref 12.0–15.0)
Lymphocytes Relative: 22 %
Lymphs Abs: 2.6 10*3/uL (ref 0.7–4.0)
MCH: 27.4 pg (ref 26.0–34.0)
MCHC: 30.1 g/dL (ref 30.0–36.0)
MCV: 91 fL (ref 78.0–100.0)
MONO ABS: 0.7 10*3/uL (ref 0.1–1.0)
MONOS PCT: 6 %
Neutro Abs: 7.9 10*3/uL — ABNORMAL HIGH (ref 1.7–7.7)
Neutrophils Relative %: 69 %
PLATELETS: 319 10*3/uL (ref 150–400)
RBC: 3.32 MIL/uL — AB (ref 3.87–5.11)
RDW: 14.1 % (ref 11.5–15.5)
WBC: 11.6 10*3/uL — ABNORMAL HIGH (ref 4.0–10.5)

## 2016-01-12 LAB — MAGNESIUM
Magnesium: 1.9 mg/dL (ref 1.7–2.4)
Magnesium: 2 mg/dL (ref 1.7–2.4)

## 2016-01-12 LAB — I-STAT CHEM 8, ED
BUN: 41 mg/dL — ABNORMAL HIGH (ref 6–20)
CALCIUM ION: 1.06 mmol/L — AB (ref 1.12–1.23)
Chloride: 102 mmol/L (ref 101–111)
Creatinine, Ser: 1.9 mg/dL — ABNORMAL HIGH (ref 0.44–1.00)
GLUCOSE: 419 mg/dL — AB (ref 65–99)
HCT: 30 % — ABNORMAL LOW (ref 36.0–46.0)
HEMOGLOBIN: 10.2 g/dL — AB (ref 12.0–15.0)
Potassium: 4.7 mmol/L (ref 3.5–5.1)
Sodium: 138 mmol/L (ref 135–145)
TCO2: 27 mmol/L (ref 0–100)

## 2016-01-12 LAB — T4, FREE: Free T4: 1.06 ng/dL (ref 0.61–1.12)

## 2016-01-12 LAB — I-STAT TROPONIN, ED: TROPONIN I, POC: 0.01 ng/mL (ref 0.00–0.08)

## 2016-01-12 LAB — GLUCOSE, CAPILLARY: GLUCOSE-CAPILLARY: 282 mg/dL — AB (ref 65–99)

## 2016-01-12 LAB — BRAIN NATRIURETIC PEPTIDE: B NATRIURETIC PEPTIDE 5: 489.9 pg/mL — AB (ref 0.0–100.0)

## 2016-01-12 LAB — TROPONIN I: Troponin I: 0.04 ng/mL — ABNORMAL HIGH (ref <0.031)

## 2016-01-12 LAB — MRSA PCR SCREENING: MRSA by PCR: NEGATIVE

## 2016-01-12 LAB — TSH: TSH: 4.112 u[IU]/mL (ref 0.350–4.500)

## 2016-01-12 IMAGING — CR DG CHEST 2V
2 series · 2 of 2 positions shown · non-contrast
Comparison: 02/17/2015

CLINICAL DATA: Shortness of breath for 3 days.

EXAM:
CHEST  2 VIEW

[chest pa]
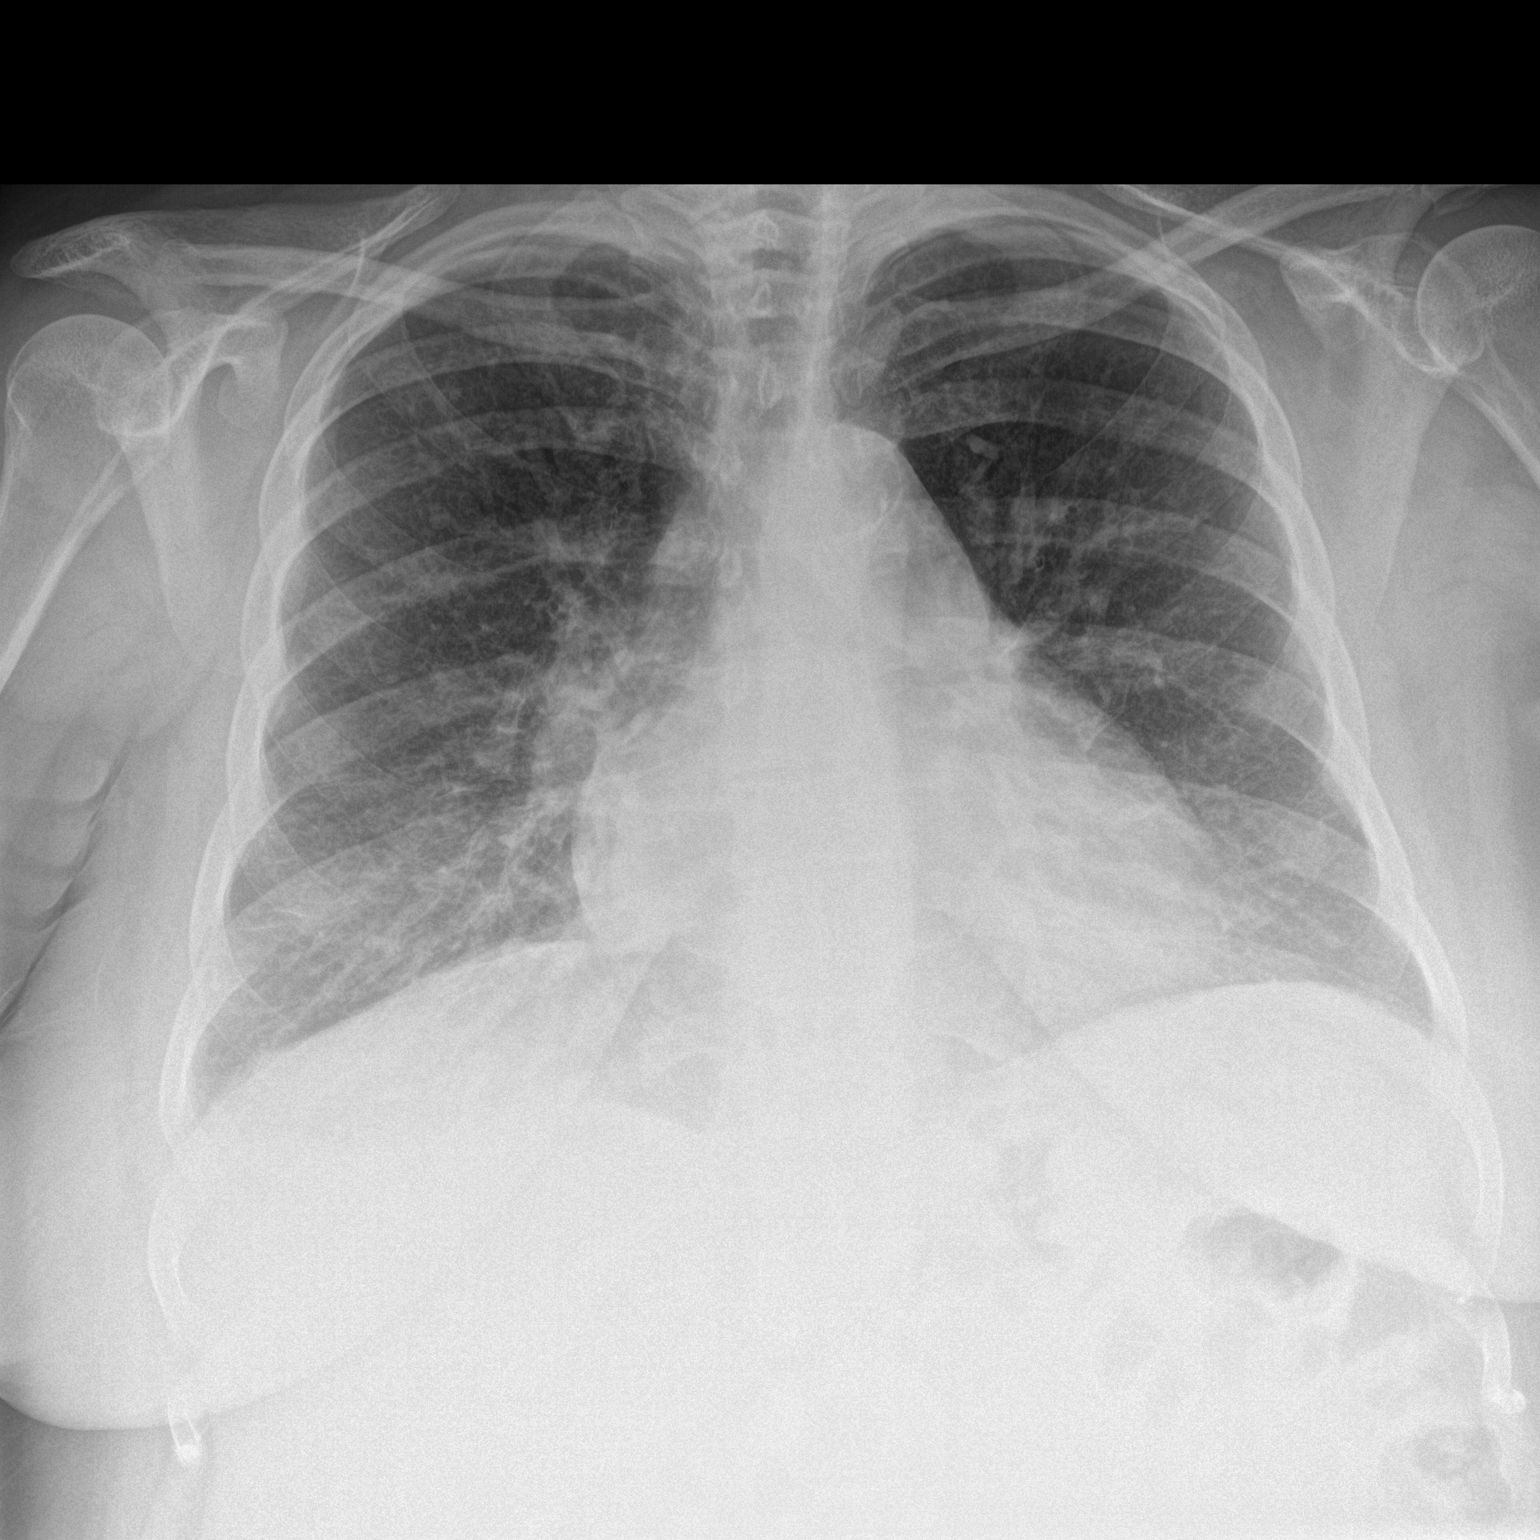

[chest lat]
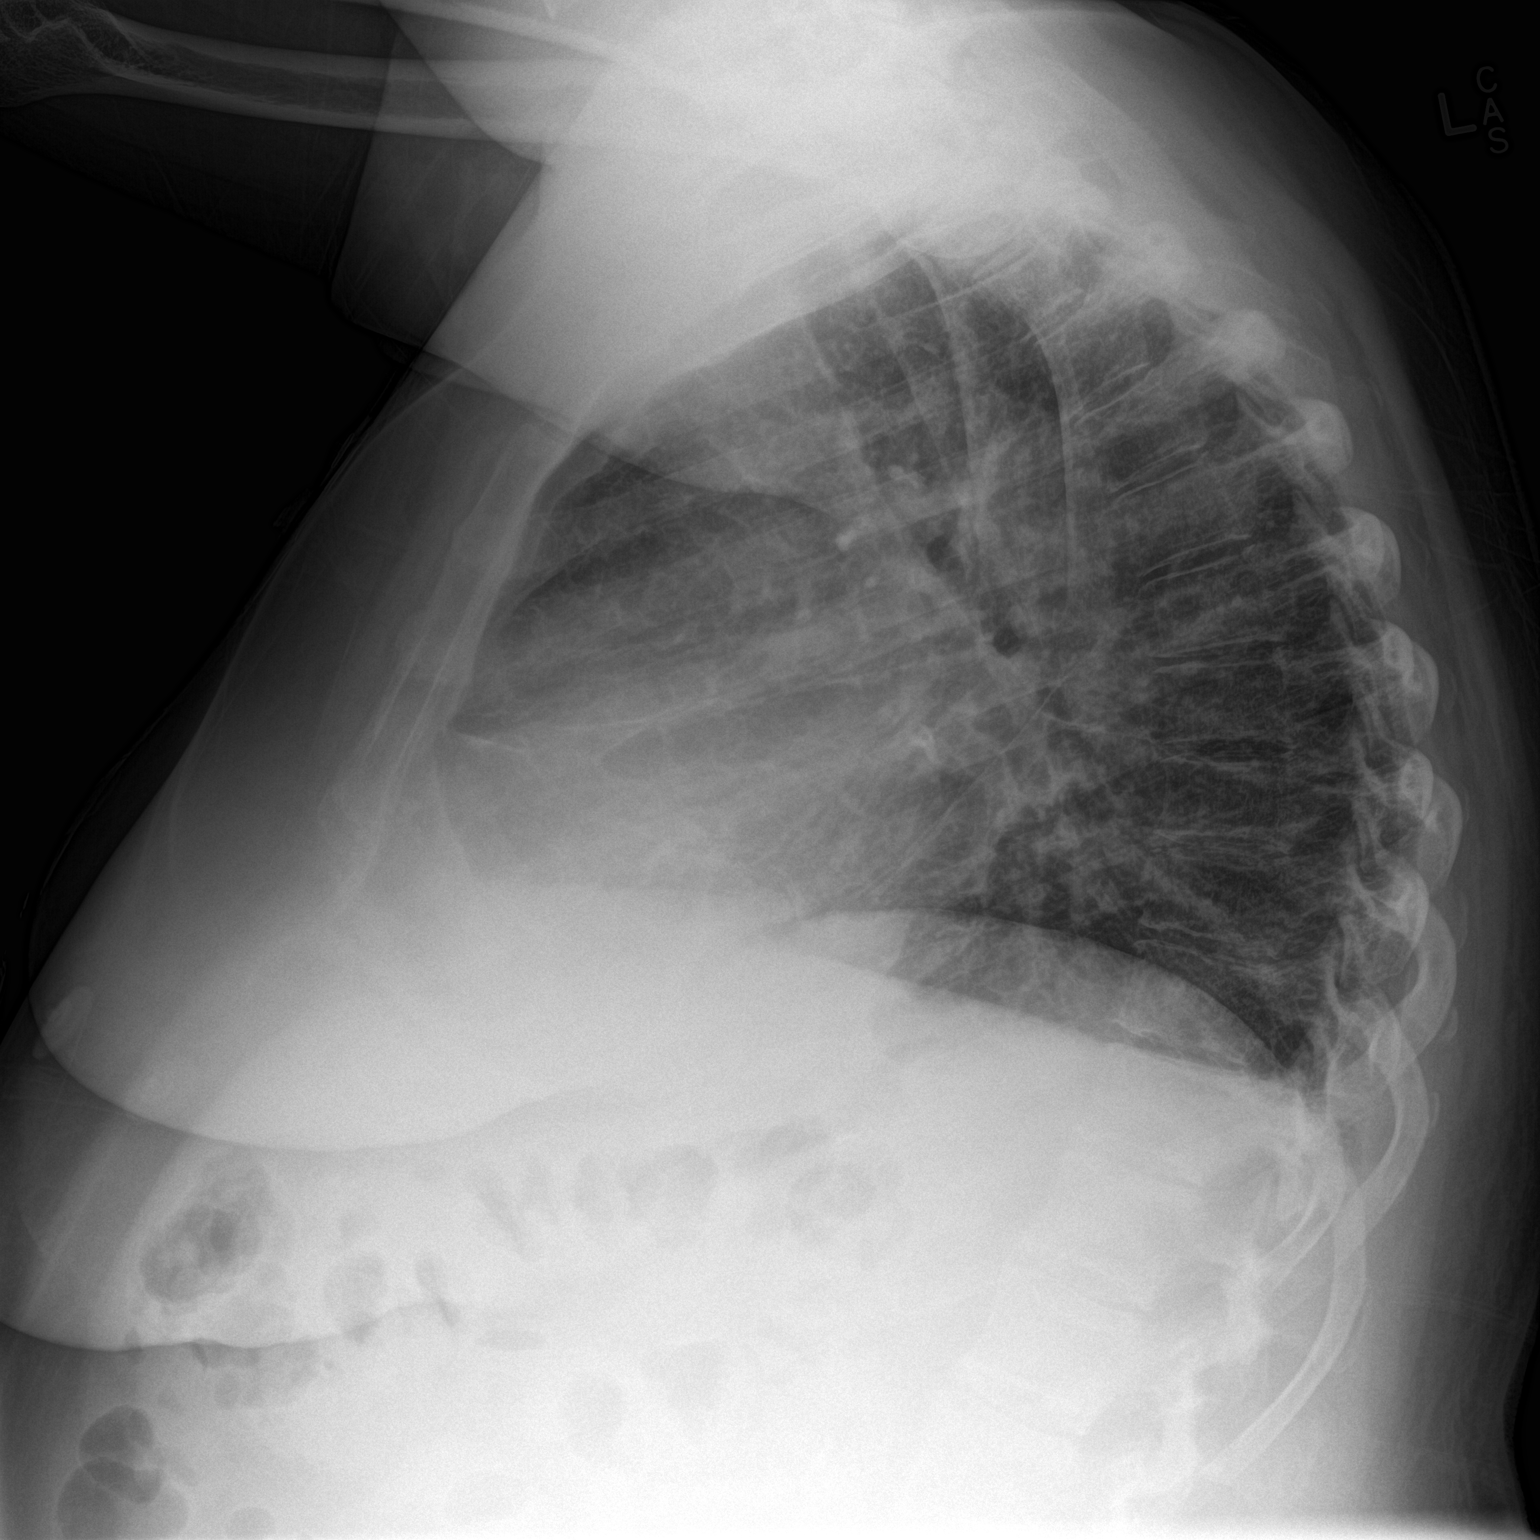

[2 of 2 positions shown; findings below may reference images not displayed]

FINDINGS: Cardiac silhouette is upper limits of normal to mildly enlarged in
size, not significantly changed. Thoracic aortic calcification is
noted. There is increased peribronchial cuffing and interstitial
densities bilaterally, greatest in the lung bases. No segmental
airspace consolidation, pleural effusion, or pneumothorax is
identified. No acute osseous abnormality is seen.
IMPRESSION: Increased interstitial densities bilaterally, which may represent
developing interstitial edema.

## 2016-01-12 SURGERY — TEMPORARY PACEMAKER

## 2016-01-12 MED ORDER — MORPHINE SULFATE (PF) 10 MG/ML IV SOLN
2.0000 mg | INTRAVENOUS | Status: DC | PRN
  Administered 2016-01-12: 2 mg via INTRAVENOUS

## 2016-01-12 MED ORDER — RIVAROXABAN 20 MG PO TABS
20.0000 mg | ORAL_TABLET | Freq: Every day | ORAL | Status: DC
Start: 2016-01-12 — End: 2016-01-13
  Administered 2016-01-12: 20 mg via ORAL
  Filled 2016-01-12: qty 1

## 2016-01-12 MED ORDER — ONDANSETRON HCL 4 MG/2ML IJ SOLN
4.0000 mg | Freq: Four times a day (QID) | INTRAMUSCULAR | Status: DC | PRN
  Administered 2016-01-12 – 2016-01-15 (×8): 4 mg via INTRAVENOUS
  Filled 2016-01-12 (×9): qty 2

## 2016-01-12 MED ORDER — MORPHINE SULFATE (PF) 2 MG/ML IV SOLN
2.0000 mg | INTRAVENOUS | Status: DC | PRN
  Administered 2016-01-12 – 2016-01-18 (×6): 2 mg via INTRAVENOUS
  Filled 2016-01-12 (×6): qty 1

## 2016-01-12 MED ORDER — METHOCARBAMOL 500 MG PO TABS
500.0000 mg | ORAL_TABLET | Freq: Two times a day (BID) | ORAL | Status: DC
  Administered 2016-01-12 – 2016-01-18 (×12): 500 mg via ORAL
  Filled 2016-01-12 (×13): qty 1

## 2016-01-12 MED ORDER — MORPHINE SULFATE (PF) 2 MG/ML IV SOLN
INTRAVENOUS | Status: AC
  Filled 2016-01-12: qty 1

## 2016-01-12 MED ORDER — ASPIRIN 81 MG PO CHEW: 324.0000 mg | CHEWABLE_TABLET | ORAL | Status: DC

## 2016-01-12 MED ORDER — ATORVASTATIN CALCIUM 40 MG PO TABS
40.0000 mg | ORAL_TABLET | Freq: Every day | ORAL | Status: DC
  Administered 2016-01-13 – 2016-01-18 (×6): 40 mg via ORAL
  Filled 2016-01-12 (×6): qty 1

## 2016-01-12 MED ORDER — ATROPINE SULFATE 1 MG/ML IJ SOLN: 0.4000 mg | Freq: Once | INTRAMUSCULAR | Status: DC

## 2016-01-12 MED ORDER — FOLIC ACID 1 MG PO TABS
1.0000 mg | ORAL_TABLET | Freq: Every day | ORAL | Status: DC
Start: 2016-01-13 — End: 2016-01-18
  Administered 2016-01-13 – 2016-01-18 (×6): 1 mg via ORAL
  Filled 2016-01-12 (×6): qty 1

## 2016-01-12 MED ORDER — ATROPINE SULFATE 0.1 MG/ML IJ SOLN
INTRAMUSCULAR | Status: AC
  Administered 2016-01-12: 1 mg via INTRAVENOUS
  Filled 2016-01-12: qty 10

## 2016-01-12 MED ORDER — MIDAZOLAM HCL 2 MG/2ML IJ SOLN
INTRAMUSCULAR | Status: AC
  Filled 2016-01-12: qty 2

## 2016-01-12 MED ORDER — NITROGLYCERIN 0.4 MG SL SUBL: 0.4000 mg | SUBLINGUAL_TABLET | SUBLINGUAL | Status: DC | PRN

## 2016-01-12 MED ORDER — INSULIN GLARGINE 100 UNIT/ML ~~LOC~~ SOLN
70.0000 [IU] | Freq: Every day | SUBCUTANEOUS | Status: DC
  Administered 2016-01-12 – 2016-01-17 (×6): 70 [IU] via SUBCUTANEOUS
  Filled 2016-01-12 (×8): qty 0.7

## 2016-01-12 MED ORDER — VITAMIN D 1000 UNITS PO TABS
2000.0000 [IU] | ORAL_TABLET | Freq: Every day | ORAL | Status: DC
  Administered 2016-01-13 – 2016-01-18 (×6): 2000 [IU] via ORAL
  Filled 2016-01-12 (×6): qty 2

## 2016-01-12 MED ORDER — DOPAMINE-DEXTROSE 3.2-5 MG/ML-% IV SOLN
0.0000 ug/kg/min | Freq: Once | INTRAVENOUS | Status: DC
Start: 2016-01-12 — End: 2016-01-12

## 2016-01-12 MED ORDER — GABAPENTIN (ONCE-DAILY) 300 MG PO TABS
300.0000 mg | ORAL_TABLET | Freq: Three times a day (TID) | ORAL | Status: DC
  Administered 2016-01-13: 300 mg via ORAL
  Filled 2016-01-12 (×3): qty 1

## 2016-01-12 MED ORDER — HYDROCODONE-ACETAMINOPHEN 5-325 MG PO TABS
1.0000 | ORAL_TABLET | Freq: Four times a day (QID) | ORAL | Status: DC | PRN
  Administered 2016-01-13 – 2016-01-18 (×14): 2 via ORAL
  Filled 2016-01-12 (×14): qty 2

## 2016-01-12 MED ORDER — SODIUM CHLORIDE 0.9 % IV SOLN
INTRAVENOUS | Status: DC
  Administered 2016-01-12: 23:00:00 via INTRAVENOUS

## 2016-01-12 MED ORDER — TORSEMIDE 20 MG PO TABS: ORAL_TABLET | ORAL | Status: DC

## 2016-01-12 MED ORDER — HEPARIN (PORCINE) IN NACL 2-0.9 UNIT/ML-% IJ SOLN
INTRAMUSCULAR | Status: AC
  Filled 2016-01-12: qty 500

## 2016-01-12 MED ORDER — HEPARIN (PORCINE) IN NACL 2-0.9 UNIT/ML-% IJ SOLN
INTRAMUSCULAR | Status: DC | PRN
  Administered 2016-01-12: 500 mL

## 2016-01-12 MED ORDER — DOPAMINE-DEXTROSE 3.2-5 MG/ML-% IV SOLN
INTRAVENOUS | Status: AC
  Filled 2016-01-12: qty 250

## 2016-01-12 MED ORDER — ATROPINE SULFATE 0.1 MG/ML IJ SOLN
1.0000 mg | Freq: Once | INTRAMUSCULAR | Status: AC
  Administered 2016-01-12: 1 mg via INTRAVENOUS

## 2016-01-12 MED ORDER — GLIPIZIDE ER 10 MG PO TB24
20.0000 mg | ORAL_TABLET | Freq: Every day | ORAL | Status: DC
  Administered 2016-01-13 – 2016-01-18 (×6): 20 mg via ORAL
  Filled 2016-01-12 (×6): qty 2

## 2016-01-12 MED ORDER — ASPIRIN EC 81 MG PO TBEC
81.0000 mg | DELAYED_RELEASE_TABLET | Freq: Every day | ORAL | Status: DC
  Administered 2016-01-13 – 2016-01-18 (×6): 81 mg via ORAL
  Filled 2016-01-12 (×6): qty 1

## 2016-01-12 MED ORDER — VITAMIN B-12 1000 MCG PO TABS
1000.0000 ug | ORAL_TABLET | Freq: Every day | ORAL | Status: DC
  Administered 2016-01-13 – 2016-01-18 (×6): 1000 ug via ORAL
  Filled 2016-01-12 (×6): qty 1

## 2016-01-12 MED ORDER — ALBUTEROL SULFATE (2.5 MG/3ML) 0.083% IN NEBU: 2.5000 mg | INHALATION_SOLUTION | Freq: Four times a day (QID) | RESPIRATORY_TRACT | Status: DC | PRN

## 2016-01-12 MED ORDER — TIOTROPIUM BROMIDE MONOHYDRATE 18 MCG IN CAPS
18.0000 ug | ORAL_CAPSULE | Freq: Every day | RESPIRATORY_TRACT | Status: DC
  Administered 2016-01-13 – 2016-01-17 (×5): 18 ug via RESPIRATORY_TRACT
  Filled 2016-01-12 (×2): qty 5

## 2016-01-12 MED ORDER — MOMETASONE FURO-FORMOTEROL FUM 200-5 MCG/ACT IN AERO
2.0000 | INHALATION_SPRAY | Freq: Two times a day (BID) | RESPIRATORY_TRACT | Status: DC
  Administered 2016-01-12 – 2016-01-18 (×11): 2 via RESPIRATORY_TRACT
  Filled 2016-01-12: qty 8.8

## 2016-01-12 MED ORDER — LIDOCAINE HCL (PF) 1 % IJ SOLN
INTRAMUSCULAR | Status: DC | PRN
  Administered 2016-01-12: 5 mL

## 2016-01-12 MED ORDER — LIDOCAINE HCL (PF) 1 % IJ SOLN
INTRAMUSCULAR | Status: AC
  Filled 2016-01-12: qty 30

## 2016-01-12 MED ORDER — LEVOTHYROXINE SODIUM 50 MCG PO TABS
50.0000 ug | ORAL_TABLET | Freq: Every day | ORAL | Status: DC
  Administered 2016-01-13 – 2016-01-18 (×6): 50 ug via ORAL
  Filled 2016-01-12 (×7): qty 1

## 2016-01-12 MED ORDER — PANTOPRAZOLE SODIUM 40 MG PO TBEC
40.0000 mg | DELAYED_RELEASE_TABLET | Freq: Every day | ORAL | Status: DC
  Administered 2016-01-13 – 2016-01-18 (×6): 40 mg via ORAL
  Filled 2016-01-12 (×6): qty 1

## 2016-01-12 MED ORDER — ASPIRIN 300 MG RE SUPP: 300.0000 mg | RECTAL | Status: DC

## 2016-01-12 MED ORDER — ACETAMINOPHEN 325 MG PO TABS: 650.0000 mg | ORAL_TABLET | ORAL | Status: DC | PRN

## 2016-01-12 MED ORDER — MIDAZOLAM HCL 2 MG/2ML IJ SOLN
INTRAMUSCULAR | Status: DC | PRN
  Administered 2016-01-12 (×2): 1 mg via INTRAVENOUS

## 2016-01-12 SURGICAL SUPPLY — 4 items
CATH S G BIP PACING (SET/KITS/TRAYS/PACK) ×3 IMPLANT
PACK CARDIAC CATHETERIZATION (CUSTOM PROCEDURE TRAY) ×3 IMPLANT
SHEATH PINNACLE 6F 10CM (SHEATH) ×3 IMPLANT
SLEEVE REPOSITIONING LENGTH 30 (MISCELLANEOUS) ×3 IMPLANT

## 2016-01-12 NOTE — CV Procedure (Addendum)
   Cardiac Catheterization Procedure Note  Name: [name] MRN: [id number] Procedure: Temporary transvenous IJ pacemaker.  Indication:  Symptomatic bradycardia.  Procedural Details: The right IJ was prepped, draped, and anesthetized with 1% lidocaine. Using the modified Seldinger technique a  5 French sheath was placed in the right internal jugular vein. A balloon tipped temporary catheter was used.  The catheter was floated to the RV apex under fluoroscopy.  ThereThere were no immediate procedural complications. The patient was transferred to the post catheterization recovery area for further monitoring.   Minus Breeding 01/12/2016, 6:03 PM

## 2016-01-12 NOTE — ED Provider Notes (Signed)
I saw and evaluated the patient, reviewed the resident's note and I agree with the findings and plan.  Pertinent History: The patient is a 55 year old female, she is chronically ill, states that she has congestive heart failure, diabetes, she has had to have a defibrillation twice in the past at an outside hospital, was admitted recently because of fluid overload and now presents today with feeling like she is shaking, weak, having some shortness of breath. The symptoms are persistent and severe  Pertinent Exam findings: On exam the patient is severely bradycardic with a heart rate of approximately 30 bpm, her pulses are strong, there is no peripheral edema, she has clear heart sounds though she is bradycardic, she does appear to be in atrial fibrillation on her EKG, she does have chronic atrial fibrillation and is anticoagulated as well as on amiodarone and a beta blocker. She denies misusing these medications at all. She denies smoking since she stopped approximately one month ago denies any other illegal drugs. The patient appears ill  I was personally present and directly supervised the following procedures:  Pacer pads placed but not needed as she maintained pressure above 80 and mentated in the ED Cards saw patient at the bedside and admitted   EKG Interpretation  Date/Time:  Tuesday January 12 2016 15:37:17 EST Ventricular Rate:  34 PR Interval:    QRS Duration: 90 QT Interval:  614 QTC Calculation: 461 R Axis:   11 Text Interpretation:  Junctional bradycardia ST \\T \ T wave abnormality, consider inferior ischemia Prolonged QT Abnormal ECG Since last tracing rate slower Confirmed by Eastyn Skalla  MD, Lataisha Colan ([id number]) on 01/12/2016 4:03:55 PM      CRITICAL CARE Performed by: Noemi Chapel D Total critical care time: 35 minutes Critical care time was exclusive of separately billable procedures and treating other patients. Critical care was necessary to treat or prevent imminent or  life-threatening deterioration. Critical care was time spent personally by me on the following activities: development of treatment plan with patient and/or surrogate as well as nursing, discussions with consultants, evaluation of patient's response to treatment, examination of patient, obtaining history from patient or surrogate, ordering and performing treatments and interventions, ordering and review of laboratory studies, ordering and review of radiographic studies, pulse oximetry and re-evaluation of patient's condition.    Final diagnoses:  Symptomatic bradycardia      Noemi Chapel, MD 01/13/16 [phone]

## 2016-01-12 NOTE — Telephone Encounter (Signed)
Informed patient to not start potassium due to recent labs showing serum K of 5.2 Patient states she does not have any bottles that say potassium or K-dur in the home. Also advised to change torsemide to 60mg  in am and 40 mg in pm for elevated Cr. Patient aware and agreeable to plan.  [name]

## 2016-01-12 NOTE — Progress Notes (Signed)
Patient ID: [name], female   DOB: 06-09-1961, 55 y.o.   MRN: [id number]    Advanced Heart Failure Clinic Note   PCP: Dr Roxy Manns HF Cardiology: Dr. Aundra Dubin PV: Dr. Gwenlyn Found  55 yo with history of PAD, carotid stenosis s/p left CEA, relatively mild CAD, chronic systolic CHF, paroxysmal atrial fibrillation and prior substance abuse presents for CHF clinic followup.  She was admitted in 1/17 with acute hypoxemic respiratory failure in the setting of atrial fibrillation/RVR and volume overload.  She was initially intubated.  She converted back to NSR with amiodarone gtt.  She was treated with IV Lasix, steroids, bronchodilators.  She developed AKI and losartan was stopped.  She is now at home with her sister.  She returns today for regular HF follow up. Torsemide increased last visit but weight is up 6 lbs.  She continues to be more limited by claudication than by dyspnea. She will get pain in the bilateral R>L calves after walking for about 4 minutes. Not smoking, no ETOH or drugs. On Chronic 02 at home via [place] 2 lpm.  No chest pain.  Breathing generally is ok, no getting short of breath walking on flat ground. No orthopnea/PND.    ECG: NSR at 55, LVH.  Labs (1/17): K 5.2, creatinine 1.4, AST 43, ALT normal Labs (2/17): K 4.2, creatinine 1.3, BNP 607  PMH: 1. Carotid stenosis: Known occluded right carotid.  Left CEA in 4/16.  2. CAD: LHC in 12/15 with 80% stenosis in small OM1, nonobstructive disease in other territories.  3. Chronic systolic CHF: Nonischemic cardiomyopathy (?due to ETOH or prior drug abuse).  - Echo (1/17) with EF 45%, mild LV hypertrophy, moderate diastolic dysfunction, inferolateral severe hypokinesis, mildly decreased RV systolic function.  4. Atrial fibrillation: Paroxysmal.   5. Type II diabetes 6. CKD: Stage III. 7. COPD: Quit smoking 1/17. On home oxygen.  8. 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  No longer drinks.  9. Hypothyroidism 10. PAD: Atherectomy SFA in 2014  (Dr Gwenlyn Found).  Peripheral arterial dopplers (2/17) with focal 75-99% proximal right SFA stenosis, occluded mid-distal right SFA, chronic occlusion of all runoff arteries on right. 11. Anemia 12. Prior cocaine abuse.   SH: Lives with sister.  Prior cocaine abuse.  Prior ETOH abuse.  Quit smoking in 1/17.  FH: CAD  ROS: All systems reviewed and negative except as per HPI.   Meds:  Amiodarone 200 mg daily Atorvastatin 40 daily Bisoprolol 2.5 daily Lisinopril 10 daily Xarelto 20 daily Torsemide 40 bid Spiriva daily  Filed Vitals:   01/11/16 1506  BP: 140/80  Pulse: 62  Weight: 242 lb 4 oz (109.884 kg)  SpO2: 100%   Wt Readings from Last 3 Encounters:  01/12/16 246 lb 7.6 oz (111.8 kg)  01/11/16 242 lb 4 oz (109.884 kg)  12/22/15 236 lb (107.049 kg)     General: NAD Neck: Thick, JVP 10 cm, no thyromegaly or thyroid nodule.  Lungs: Distant breath sounds bilaterally.  CV: Nondisplaced PMI.  Heart regular S1/S2, no S3/S4, no murmur.  1+ ankle edema bilaterally.  No carotid bruit.  Unable to palpate pedal pulses.  Abdomen: Soft, nontender, no hepatosplenomegaly, no distention.  Skin: Intact without lesions or rashes.  Neurologic: Alert and oriented x 3.  Psych: Normal affect. Extremities: No clubbing or cyanosis.  HEENT: Normal.   Assessment/Plan: 1. Chronic systolic CHF: EF [id number] on last echo. Has been presumed nonischemic given LHC in 12/15 showing only 80% stenosis in small OM1. Recent  admission with acute on chronic systolic CHF likely triggered by atrial fibrillation/RVR + dietary indiscretion.  She remains mildly volume overloaded.  More limited by claudication than dypsnea.  - Increase torsemide to 60 mg BID and KCl to 20 mEq bid. - BMET/BNP today and recheck in 10 days.  - Continue bisoprolol 2.5 mg daily.  - Continue lisinopril 10 mg daily.  - Try to limit dietary sodium.   2. Atrial fibrillation: Now in NSR on amiodarone.  She is on Xarelto 20 mg daily.  - Continue  amiodarone 200 daily.  PCP following hypothyroidism (on Levoxyl).  LFTs in 1/17 very mildly elevated.  Will need regular eye exams. 3. PAD: Right calf claudication, difficult to palpate pulses.  ABIs show severe disease on right, may not have interventional options.  Not candidate for cilostazol with CHF.   - I would like her to get back in to see Dr Gwenlyn Found, will arrange appointment.   4. COPD: On home oxygen.  Has quit smoking, needs to stay off cigarettes.  5. H/o cirrhosis: Likely from ETOH, no longer drinks.   Loralie Champagne 01/11/16
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
## 2016-01-12 NOTE — ED Notes (Signed)
Cardiology at bedside.

## 2016-01-12 NOTE — ED Provider Notes (Signed)
CSN: [id number]     Arrival date & time 01/12/16  1504 History   First MD Initiated Contact with Patient 01/12/16 1541     Chief Complaint  Patient presents with  . Bradycardia  . Marine scientist  . Shaking   Patient is a 55 y.o. female presenting with general illness. The history is provided by the patient. No language interpreter was used.  Illness Quality:  Bradycardia, dizziness Severity:  Severe Onset quality:  Sudden Duration:  2 hours Timing:  Constant Progression:  Worsening Chronicity:  New Context:  On beta blocker, amiodarone Relieved by:  None Worsened by:  None Ineffective treatments:  None Associated symptoms: no abdominal pain, no chest pain, no congestion, no cough, no diarrhea, no ear pain, no fever, no headaches, no loss of consciousness, no nausea, no rash, no rhinorrhea, no shortness of breath, no sore throat, no vomiting and no wheezing     Past Medical History  Diagnosis Date  . Alcohol abuse   . Narcotic abuse   . Cocaine abuse   . Marijuana abuse   . Tobacco abuse   . Alcoholic cirrhosis ([place])   . Cardiomyopathy ([place])   . Obesity   . Chronic combined systolic and diastolic CHF (congestive heart failure) ()     a. Last echo 12/205: EF 40-45%, inferoapical/posterior HK, not technically sufficient to allow eval of LV diastolic function, normal LA in size..  . Hypothyroidism   . GERD (gastroesophageal reflux disease)   . PAF (paroxysmal atrial fibrillation) ([place])   . Critical lower limb ischemia   . Hypertension   . CAD (coronary artery disease)     a. cath 11/10/2014 LM nl, LAD min irregs, D1 30 ost, D2 50d, LCX 43m OM1 80 p/m (1.5 mm vessel), OM2 426mRCA nondom 9040mmed rx.. Demand ischemia in the setting of rapid a-fib.  . AMarland Kitchenxiety   . Poorly controlled type 2 diabetes mellitus ([place]CC[place] . Hyperlipemia   . Peripheral arterial disease (HCC[place]   a. 01/2015 Angio/PTA: LSFA 100 w/ recon @ adductor canal and 1 vessel runoff via AT, RSFA 99  (atherectomy/pta) - 1 vessel runoff via diff dzs peroneal.  . Carotid artery disease (HCCPlev[place]   a. 01/2015 Carotid Angio: RICA [id number] L carotid endarterectomy 02/2015.  . CMarland KitchenPD (chronic obstructive pulmonary disease) (HCCArthur . Diabetic peripheral neuropathy (HCCCedar [place] . Paroxysmal atrial tachycardia (HCC[place] . Noncompliance   . Anemia   . B12 deficiency   . Alcoholic cirrhosis (HCCWest [place] . NSVT (nonsustained ventricular tachycardia) (HCCRoyse [place] . Paroxysmal atrial tachycardia (HCC[place] . Hypokalemia   . Hypomagnesemia   . Renal insufficiency     a. 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  . EMarland Kitchenevated troponin     a. Chronic elevation.   Past Surgical History  Procedure Laterality Date  . Cardioversion  ~ 02/2013    "twice"   . Lower extremity angiogram N/A 09/10/2013    Procedure: LOWER EXTREMITY ANGIOGRAM;  Surgeon: JonLorretta HarpD;  [facility];  Service: Cardiovascular;  Laterality: N/A;  . Left heart catheterization with coronary angiogram N/A 10/31/2014    Procedure: LEFT HEART CATHETERIZATION WITH CORONARY ANGIOGRAM;  Surgeon: ChrBurnell BlanksD;  [facility];  Service: Cardiovascular;  Laterality: N/A;  . Peripheral athrectomy Right 01/15/2015    SFA/notes 01/15/2015  . Balloon angioplasty, artery Right 01/15/2015    SFA/notes 01/15/2015  . Cardiac  catheterization    . Lower extremity angiogram N/A 01/15/2015    Procedure: LOWER EXTREMITY ANGIOGRAM;  Surgeon: [name], MD;  Location: [facility] CATH LAB;  Service: Cardiovascular;  Laterality: N/A;  . Carotid angiogram N/A 01/15/2015    Procedure: CAROTID ANGIOGRAM;  Surgeon: [name], MD;  Location: [facility] CATH LAB;  Service: Cardiovascular;  Laterality: N/A;  . Endarterectomy Left 02/19/2015    Procedure: LEFT CAROTID ENDARTERECTOMY ;  Surgeon: Serafina Mitchell, MD;  Location: [facility];  Service: Vascular;  Laterality: Left;  . Dilation and curettage of uterus  1988   Family History  Problem Relation Age of Onset  .  Hypertension Mother   . Diabetes Mother   . Cancer Mother     breast, ovarian, colon  . Clotting disorder Mother   . Heart disease Mother   . Heart attack Mother   . Hypertension Father   . Heart disease Father   . Emphysema Sister     smoked   Social History  Substance Use Topics  . Smoking status: Former Smoker -- 0.00 packs/day for 40 years    Types: E-cigarettes    Quit date: 11/03/2015  . Smokeless tobacco: Former Systems developer  . Alcohol Use: No   OB History    Gravida Para Term Preterm AB TAB SAB Ectopic Multiple Living   '1 1 1       1     ' Review of Systems  Constitutional: Negative for fever, chills, activity change and appetite change.  HENT: Negative for congestion, dental problem, ear pain, facial swelling, hearing loss, rhinorrhea, sneezing, sore throat, trouble swallowing and voice change.   Eyes: Negative for photophobia, pain, redness and visual disturbance.  Respiratory: Negative for apnea, cough, chest tightness, shortness of breath, wheezing and stridor.   Cardiovascular: Negative for chest pain, palpitations and leg swelling.       Bradycardia  Gastrointestinal: Negative for nausea, vomiting, abdominal pain, diarrhea, constipation, blood in stool and abdominal distention.  Endocrine: Negative for polydipsia and polyuria.  Genitourinary: Negative for frequency, hematuria, flank pain, decreased urine volume and difficulty urinating.  Musculoskeletal: Negative for back pain, joint swelling, gait problem, neck pain and neck stiffness.  Skin: Negative for rash and wound.  Allergic/Immunologic: Negative for immunocompromised state.  Neurological: Positive for light-headedness. Negative for dizziness, loss of consciousness, syncope, facial asymmetry, speech difficulty, weakness, numbness and headaches.  Hematological: Negative for adenopathy.  Psychiatric/Behavioral: Negative for suicidal ideas, behavioral problems, confusion, sleep disturbance and agitation. The patient is  not nervous/anxious.   All other systems reviewed and are negative.     Allergies  Review of patient's allergies indicates no known allergies.  Home Medications   Prior to Admission medications   Medication Sig Start Date End Date Taking? Authorizing Provider  acetaminophen (TYLENOL) 500 MG tablet Take 1,000 mg by mouth every 6 (six) hours as needed (pain). Reported on 01/11/2016    Historical Provider, MD  acetaminophen-codeine (TYLENOL #3) 300-30 MG tablet Take 1 tablet by mouth every 4 (four) hours as needed for moderate pain.    Historical Provider, MD  albuterol (PROVENTIL HFA;VENTOLIN HFA) 108 (90 BASE) MCG/ACT inhaler Inhale 2 puffs into the lungs every 6 (six) hours as needed for wheezing or shortness of breath. 06/30/15   Arnoldo Morale, MD  albuterol (PROVENTIL) (2.5 MG/3ML) 0.083% nebulizer solution Take 3 mLs (2.5 mg total) by nebulization every 6 (six) hours as needed. For wheezing. 06/30/15   Arnoldo Morale, MD  amiodarone (PACERONE) 200 MG  tablet Take 1 tablet (200 mg total) by mouth daily. 12/22/15   Shirley Friar, PA-C  atorvastatin (LIPITOR) 40 MG tablet Take 1 tablet (40 mg total) by mouth daily. 06/30/15   Arnoldo Morale, MD  bisoprolol (ZEBETA) 5 MG tablet Take 0.5 tablets (2.5 mg total) by mouth daily. 12/14/15   Larey Dresser, MD  Blood Glucose Monitoring Suppl (ACCU-CHEK AVIVA PLUS) W/DEVICE KIT 1 Device by Does not apply route 4 (four) times daily -  before meals and at bedtime. 07/21/15   Arnoldo Morale, MD  budesonide-formoterol (SYMBICORT) 160-4.5 MCG/ACT inhaler Inhale 2 puffs into the lungs 2 (two) times daily. 12/03/15   Shirley Friar, PA-C  Cholecalciferol (VITAMIN D) 2000 UNITS tablet Take 2,000 Units by mouth daily.    Historical Provider, MD  folic acid (FOLVITE) 1 MG tablet Take 1 tablet (1 mg total) by mouth daily. 11/09/15   Brittainy Erie Noe, PA-C  Gabapentin, Once-Daily, (GRALISE) 300 MG TABS Take 300 mg by mouth 3 (three) times daily. 12/03/15    Shirley Friar, PA-C  glipiZIDE (GLUCOTROL XL) 10 MG 24 hr tablet Take 2 tablets (20 mg total) by mouth daily with breakfast. 06/30/15   Arnoldo Morale, MD  glucose blood (ACCU-CHEK AVIVA PLUS) test strip Use as instructed 07/21/15   Arnoldo Morale, MD  HYDROcodone-acetaminophen (NORCO/VICODIN) 5-325 MG tablet Take 1 tablet by mouth every 6 (six) hours as needed. 01/07/16   Olivia Canter Sam, PA-C  insulin aspart (NOVOLOG) 100 UNIT/ML FlexPen Inject 6-14 Units into the skin 3 (three) times daily with meals. Sliding scale 12/03/15   Shirley Friar, PA-C  insulin glargine (LANTUS) 100 unit/mL SOPN Inject 0.7 mLs (70 Units total) into the skin at bedtime. 12/03/15   Shirley Friar, PA-C  Insulin Pen Needle (ULTICARE MICRO PEN NEEDLES) 32G X 4 MM MISC 1 Syringe by Does not apply route 4 (four) times daily - after meals and at bedtime. 06/30/15   Arnoldo Morale, MD  Lancets (ACCU-CHEK SOFT TOUCH) lancets Use as instructed 07/21/15   Arnoldo Morale, MD  levothyroxine (SYNTHROID, LEVOTHROID) 50 MCG tablet Take 1 tablet (50 mcg total) by mouth daily. 06/30/15   Arnoldo Morale, MD  lisinopril (PRINIVIL) 10 MG tablet Take 1 tablet (10 mg total) by mouth daily. 12/22/15   Shirley Friar, PA-C  methocarbamol (ROBAXIN) 500 MG tablet Take 1 tablet (500 mg total) by mouth 2 (two) times daily. 01/07/16   Olivia Canter Sam, PA-C  naproxen (NAPROSYN) 500 MG tablet Take 1 tablet (500 mg total) by mouth 2 (two) times daily. 01/07/16   Olivia Canter Sam, PA-C  nitroGLYCERIN (NITROSTAT) 0.4 MG SL tablet Place 1 tablet (0.4 mg total) under the tongue every 5 (five) minutes as needed for chest pain. 01/16/15   Rogelia Mire, NP  pantoprazole (PROTONIX) 40 MG tablet Take 1 tablet (40 mg total) by mouth daily. 09/22/15 09/21/16  Arnoldo Morale, MD  potassium chloride 20 MEQ TBCR Take 20 mEq by mouth 2 (two) times daily. 01/11/16   Larey Dresser, MD  rivaroxaban (XARELTO) 20 MG TABS tablet Take 1 tablet (20 mg total) by mouth daily  with supper. 06/30/15   Arnoldo Morale, MD  tiotropium (SPIRIVA) 18 MCG inhalation capsule Place 1 capsule (18 mcg total) into inhaler and inhale daily. 12/03/15   Shirley Friar, PA-C  torsemide (DEMADEX) 20 MG tablet Take 60 mg (3 tabs) in am and 40 mg (2 tabs) in pm 01/12/16   Larey Dresser, MD  vitamin B-12 1000 MCG tablet Take 1 tablet (1,000 mcg total) by mouth daily. 11/10/15   Brittainy M Simmons, PA-C   BP 97/58 mmHg  Pulse 49  Temp(Src) 98.6 F (37 C) (Oral)  Resp 20  SpO2 99%  LMP 11/27/2012 Physical Exam  Constitutional: She is oriented to person, place, and time. She appears well-developed and well-nourished. No distress.  HENT:  Head: Normocephalic and atraumatic.  Right Ear: External ear normal.  Left Ear: External ear normal.  Eyes: Pupils are equal, round, and reactive to light. Right eye exhibits no discharge. Left eye exhibits no discharge.  Neck: Normal range of motion. No JVD present. No tracheal deviation present.  Cardiovascular: Regular rhythm and normal heart sounds.  Bradycardia present.  Exam reveals no friction rub.   No murmur heard. Pulmonary/Chest: Effort normal and breath sounds normal. No stridor. No respiratory distress. She has no wheezes.  Abdominal: Soft. Bowel sounds are normal. She exhibits no distension. There is no rebound and no guarding.  Musculoskeletal: Normal range of motion. She exhibits no edema or tenderness.  Lymphadenopathy:    She has no cervical adenopathy.  Neurological: She is alert and oriented to person, place, and time. No cranial nerve deficit. Coordination normal.  Skin: Skin is warm and dry. No rash noted. No pallor.  Psychiatric: She has a normal mood and affect. Her behavior is normal. Judgment and thought content normal.  Nursing note and vitals reviewed.   ED Course  Procedures (including critical care time) Labs Review Labs Reviewed  CBC WITH DIFFERENTIAL/PLATELET - Abnormal; Notable for the following:    WBC  11.6 (*)    RBC 3.32 (*)    Hemoglobin 9.1 (*)    HCT 30.2 (*)    Neutro Abs 7.9 (*)    All other components within normal limits  BRAIN NATRIURETIC PEPTIDE - Abnormal; Notable for the following:    B Natriuretic Peptide 489.9 (*)    All other components within normal limits  GLUCOSE, CAPILLARY - Abnormal; Notable for the following:    Glucose-Capillary 282 (*)    All other components within normal limits  I-STAT CHEM 8, ED - Abnormal; Notable for the following:    BUN 41 (*)    Creatinine, Ser 1.90 (*)    Glucose, Bld 419 (*)    Calcium, Ion 1.06 (*)    Hemoglobin 10.2 (*)    HCT 30.0 (*)    All other components within normal limits  MAGNESIUM  I-STAT TROPOININ, ED    Imaging Review Dg Chest Portable 1 View  01/12/2016  CLINICAL DATA:  55 year old female with history of bradycardia. Shaking. Chest pain. EXAM: PORTABLE CHEST 1 VIEW COMPARISON:  Chest x-ray 11/27/2015. FINDINGS: Transcutaneous defibrillator pads projecting over the lower left hemithorax. Low lung volumes. No acute consolidative airspace disease. No pleural effusions. No evidence of pulmonary edema. No pneumothorax. Heart size is mildly enlarged (unchanged). The patient is rotated to the right on today's exam, resulting in distortion of the mediastinal contours and reduced diagnostic sensitivity and specificity for mediastinal pathology. Atherosclerotic calcifications in the thoracic aorta. IMPRESSION: 1. Low lung volumes without radiographic evidence of acute cardiopulmonary disease. 2. Mild cardiomegaly. 3. Atherosclerosis. Electronically Signed   By: Vinnie Langton M.D.   On: 01/12/2016 16:18   I have personally reviewed and evaluated these images and lab results as part of my medical decision-making.   EKG Interpretation   Date/Time:  Tuesday January 12 2016 15:37:17 EST Ventricular Rate:  34 PR Interval:  QRS Duration: 90 QT Interval:  614 QTC Calculation: 461 R Axis:   11 Text Interpretation:   Junctional bradycardia ST' \T' \ T wave abnormality,  consider inferior ischemia Prolonged QT Abnormal ECG Since last tracing  rate slower Confirmed by MILLER  MD, BRIAN ([id number]) on 01/12/2016 4:03:55 PM      MDM   Final diagnoses:  Symptomatic bradycardia    Patient with symptomatic bradycardia which started roughly 2 hours ago. Patient with history of atrial fibrillation, CHF. Patient taking a beta blocker as well as amiodarone as an outpatient.  Upon arrival patient with heart rate 31. EKG consistent with sinus bradycardia versus atrial fibrillation bradycardia. Patient with symptoms of dizziness. She denies any chest pain or shortness of breath.  Atropine 1 mg given with transient increase in heart rate to operative 30s. Patient's blood pressure soft with 48G to 89V systolic. No indication for transcutaneous pacing at this point.  Chest x-ray with low lung volumes.  Consult to cardiology after atropine did not fix her bradycardia. Cardiology saw patient in ED at the patient for transvenous pacemaker. Patient to be admitted to cardiology following procedure.  Discussed case my attending Dr. Sabra Heck.    Vira Blanco, MD 01/12/16 [place], MD 01/13/16 [phone]
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
## 2016-01-13 DIAGNOSIS — I481 Persistent atrial fibrillation: Secondary | ICD-10-CM

## 2016-01-13 DIAGNOSIS — N179 Acute kidney failure, unspecified: Secondary | ICD-10-CM

## 2016-01-13 DIAGNOSIS — E1151 Type 2 diabetes mellitus with diabetic peripheral angiopathy without gangrene: Secondary | ICD-10-CM

## 2016-01-13 DIAGNOSIS — N189 Chronic kidney disease, unspecified: Secondary | ICD-10-CM

## 2016-01-13 DIAGNOSIS — I495 Sick sinus syndrome: Secondary | ICD-10-CM

## 2016-01-13 LAB — BASIC METABOLIC PANEL
ANION GAP: 11 (ref 5–15)
BUN: 49 mg/dL — ABNORMAL HIGH (ref 6–20)
CALCIUM: 8 mg/dL — AB (ref 8.9–10.3)
CHLORIDE: 103 mmol/L (ref 101–111)
CO2: 23 mmol/L (ref 22–32)
Creatinine, Ser: 2.42 mg/dL — ABNORMAL HIGH (ref 0.44–1.00)
GFR calc non Af Amer: 22 mL/min — ABNORMAL LOW (ref >60)
GFR, EST AFRICAN AMERICAN: 25 mL/min — AB (ref >60)
Glucose, Bld: 216 mg/dL — ABNORMAL HIGH (ref 65–99)
Potassium: 5.8 mmol/L — ABNORMAL HIGH (ref 3.5–5.1)
Sodium: 137 mmol/L (ref 135–145)

## 2016-01-13 LAB — GLUCOSE, CAPILLARY
GLUCOSE-CAPILLARY: 212 mg/dL — AB (ref 65–99)
GLUCOSE-CAPILLARY: 211 mg/dL — AB (ref 65–99)
Glucose-Capillary: 272 mg/dL — ABNORMAL HIGH (ref 65–99)
Glucose-Capillary: 161 mg/dL — ABNORMAL HIGH (ref 65–99)

## 2016-01-13 LAB — CBC
HCT: 27.8 % — ABNORMAL LOW (ref 36.0–46.0)
HEMOGLOBIN: 8.7 g/dL — AB (ref 12.0–15.0)
MCH: 28.9 pg (ref 26.0–34.0)
MCHC: 31.3 g/dL (ref 30.0–36.0)
MCV: 92.4 fL (ref 78.0–100.0)
Platelets: 222 10*3/uL (ref 150–400)
RBC: 3.01 MIL/uL — AB (ref 3.87–5.11)
RDW: 14.4 % (ref 11.5–15.5)
WBC: 10.7 10*3/uL — ABNORMAL HIGH (ref 4.0–10.5)

## 2016-01-13 LAB — BASIC METABOLIC PANEL WITH GFR
Anion gap: 10 (ref 5–15)
BUN: 53 mg/dL — ABNORMAL HIGH (ref 6–20)
CO2: 26 mmol/L (ref 22–32)
Calcium: 8.2 mg/dL — ABNORMAL LOW (ref 8.9–10.3)
Chloride: 101 mmol/L (ref 101–111)
Creatinine, Ser: 2.68 mg/dL — ABNORMAL HIGH (ref 0.44–1.00)
GFR calc Af Amer: 22 mL/min — ABNORMAL LOW
GFR calc non Af Amer: 19 mL/min — ABNORMAL LOW
Glucose, Bld: 220 mg/dL — ABNORMAL HIGH (ref 65–99)
Potassium: 5.8 mmol/L — ABNORMAL HIGH (ref 3.5–5.1)
Sodium: 137 mmol/L (ref 135–145)

## 2016-01-13 LAB — TROPONIN I
TROPONIN I: 0.05 ng/mL — AB (ref <0.031)
Troponin I: 0.04 ng/mL — ABNORMAL HIGH (ref <0.031)

## 2016-01-13 MED ORDER — SODIUM CHLORIDE 0.9 % IV BOLUS (SEPSIS)
250.0000 mL | Freq: Once | INTRAVENOUS | Status: AC
  Administered 2016-01-13: 250 mL via INTRAVENOUS

## 2016-01-13 MED ORDER — GABAPENTIN (ONCE-DAILY) 300 MG PO TABS
1200.0000 mg | ORAL_TABLET | Freq: Every day | ORAL | Status: DC
  Administered 2016-01-14: 1200 mg via ORAL
  Filled 2016-01-13: qty 1

## 2016-01-13 MED ORDER — INSULIN ASPART 100 UNIT/ML ~~LOC~~ SOLN
0.0000 [IU] | Freq: Three times a day (TID) | SUBCUTANEOUS | Status: DC
  Administered 2016-01-13: 8 [IU] via SUBCUTANEOUS
  Administered 2016-01-13: 5 [IU] via SUBCUTANEOUS
  Administered 2016-01-14: 3 [IU] via SUBCUTANEOUS
  Administered 2016-01-14: 2 [IU] via SUBCUTANEOUS
  Administered 2016-01-14: 5 [IU] via SUBCUTANEOUS
  Administered 2016-01-15: 3 [IU] via SUBCUTANEOUS
  Administered 2016-01-15: 2 [IU] via SUBCUTANEOUS
  Administered 2016-01-15: 5 [IU] via SUBCUTANEOUS
  Administered 2016-01-16: 3 [IU] via SUBCUTANEOUS
  Administered 2016-01-16: 2 [IU] via SUBCUTANEOUS
  Administered 2016-01-16 – 2016-01-17 (×2): 5 [IU] via SUBCUTANEOUS
  Administered 2016-01-17: 8 [IU] via SUBCUTANEOUS
  Administered 2016-01-17: 3 [IU] via SUBCUTANEOUS
  Administered 2016-01-18: 5 [IU] via SUBCUTANEOUS

## 2016-01-13 MED FILL — Morphine Sulfate Inj 2 MG/ML: INTRAMUSCULAR | Qty: 1 | Status: AC

## 2016-01-13 NOTE — Care Management Note (Addendum)
Case Management Note  Patient Details  Name: [name] MRN: [id number] Date of Birth: 1961/09/12  Subjective/Objective:        Adm w bradycardia            Action/Plan: lives w sister, pcp dr sun, act w [facility] for rn-pt-sw   Expected Discharge Date:                  Expected Discharge Plan:     In-House Referral:     Discharge planning Services     Post Acute Care Choice:    Choice offered to:     DME Arranged:    DME Agency:     HH Arranged:    [place] Agency:     Status of Service:     Medicare Important Message Given:    Date Medicare IM Given:    Medicare IM give by:    Date Additional Medicare IM Given:    Additional Medicare Important Message give by:     If discussed at [place] of Stay Meetings, dates discussed:    Additional Comments: ur review done, alerted donna w [facility] of pt's adm  Lacretia Leigh, RN 01/13/2016, 8:39 AM

## 2016-01-13 NOTE — Progress Notes (Signed)
[facility]  Patient Status: Active (receiving services up to time of hospitalization)  [facility] is providing the following services: RN, PT and MSW  If patient discharges after hours, please call [phone].   [name] 01/13/2016, 10:13 AM
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
## 2016-01-13 NOTE — Progress Notes (Signed)
SUBJECTIVE:  Nauseated this morning.  No chest pain.     PHYSICAL EXAM Filed Vitals:   01/13/16 0400 01/13/16 0500 01/13/16 0600 01/13/16 0700  BP: 96/51 105/60 95/58 107/56  Pulse: 40 34 66 75  Temp: 97.9 F (36.6 C)     TempSrc: Oral     Resp: 10 15 14 22   Height:      Weight:  246 lb 7.6 oz (111.8 kg)    SpO2: 100% 100% 100% 100%   General:  No acute distress Lungs:  Clear Heart:  RRR Abdomen:  Positive bowel sounds, no rebound no guarding Extremities:  Trace edema Neuro:  Nonfocal  LABS: Lab Results  Component Value Date   TROPONINI 0.05* 01/13/2016   Results for orders placed or performed during the hospital encounter of 01/12/16 (from the past 24 hour(s))  CBC with Differential     Status: Abnormal   Collection Time: 01/12/16  3:58 PM  Result Value Ref Range   WBC 11.6 (H) 4.0 - 10.5 K/uL   RBC 3.32 (L) 3.87 - 5.11 MIL/uL   Hemoglobin 9.1 (L) 12.0 - 15.0 g/dL   HCT 30.2 (L) 36.0 - 46.0 %   MCV 91.0 78.0 - 100.0 fL   MCH 27.4 26.0 - 34.0 pg   MCHC 30.1 30.0 - 36.0 g/dL   RDW 14.1 11.5 - 15.5 %   Platelets 319 150 - 400 K/uL   Neutrophils Relative % 69 %   Neutro Abs 7.9 (H) 1.7 - 7.7 K/uL   Lymphocytes Relative 22 %   Lymphs Abs 2.6 0.7 - 4.0 K/uL   Monocytes Relative 6 %   Monocytes Absolute 0.7 0.1 - 1.0 K/uL   Eosinophils Relative 3 %   Eosinophils Absolute 0.3 0.0 - 0.7 K/uL   Basophils Relative 0 %   Basophils Absolute 0.0 0.0 - 0.1 K/uL  Magnesium     Status: None   Collection Time: 01/12/16  3:58 PM  Result Value Ref Range   Magnesium 1.9 1.7 - 2.4 mg/dL  Brain natriuretic peptide     Status: Abnormal   Collection Time: 01/12/16  3:59 PM  Result Value Ref Range   B Natriuretic Peptide 489.9 (H) 0.0 - 100.0 pg/mL  I-Stat Troponin, ED (not at [facility])     Status: None   Collection Time: 01/12/16  4:11 PM  Result Value Ref Range   Troponin i, poc 0.01 0.00 - 0.08 ng/mL   Comment 3          I-Stat Chem 8, ED     Status: Abnormal   Collection  Time: 01/12/16  4:12 PM  Result Value Ref Range   Sodium 138 135 - 145 mmol/L   Potassium 4.7 3.5 - 5.1 mmol/L   Chloride 102 101 - 111 mmol/L   BUN 41 (H) 6 - 20 mg/dL   Creatinine, Ser 1.90 (H) 0.44 - 1.00 mg/dL   Glucose, Bld 419 (H) 65 - 99 mg/dL   Calcium, Ion 1.06 (L) 1.12 - 1.23 mmol/L   TCO2 27 0 - 100 mmol/L   Hemoglobin 10.2 (L) 12.0 - 15.0 g/dL   HCT 30.0 (L) 36.0 - 46.0 %  Glucose, capillary     Status: Abnormal   Collection Time: 01/12/16  6:13 PM  Result Value Ref Range   Glucose-Capillary 282 (H) 65 - 99 mg/dL  MRSA PCR Screening     Status: None   Collection Time: 01/12/16  8:15 PM  Result Value Ref  Range   MRSA by PCR NEGATIVE NEGATIVE  Magnesium     Status: None   Collection Time: 01/12/16  8:55 PM  Result Value Ref Range   Magnesium 2.0 1.7 - 2.4 mg/dL  Hepatic function panel     Status: Abnormal   Collection Time: 01/12/16  8:55 PM  Result Value Ref Range   Total Protein 6.4 (L) 6.5 - 8.1 g/dL   Albumin 2.8 (L) 3.5 - 5.0 g/dL   AST 40 15 - 41 U/L   ALT 67 (H) 14 - 54 U/L   Alkaline Phosphatase 199 (H) 38 - 126 U/L   Total Bilirubin 0.5 0.3 - 1.2 mg/dL   Bilirubin, Direct <0.1 (L) 0.1 - 0.5 mg/dL   Indirect Bilirubin NOT CALCULATED 0.3 - 0.9 mg/dL  T4, free     Status: None   Collection Time: 01/12/16  8:55 PM  Result Value Ref Range   Free T4 1.06 0.61 - 1.12 ng/dL  Troponin I     Status: Abnormal   Collection Time: 01/12/16  8:55 PM  Result Value Ref Range   Troponin I 0.04 (H) <0.031 ng/mL  TSH     Status: None   Collection Time: 01/12/16  9:00 PM  Result Value Ref Range   TSH 4.112 0.350 - 4.500 uIU/mL  Troponin I     Status: Abnormal   Collection Time: 01/13/16  2:15 AM  Result Value Ref Range   Troponin I 0.05 (H) <0.031 ng/mL    Intake/Output Summary (Last 24 hours) at 01/13/16 [id number] Last data filed at 01/13/16 0700  Gross per 24 hour  Intake    110 ml  Output      0 ml  Net    110 ml    ASSESSMENT AND PLAN:  SYMPTOMATIC  BRADYCARDIA:   Underlying rate is still in the 30s.  Will ask EP to follow.  Might end up needing a pacemaker.    ATRIAL FIB:    No underlying P waves noted.  Holding Xarelto with CKD and with possible pending pacemaker.  Holding amiodarone.    ACUTE ON CHRONIC SYSTOLIC FAILURE:  Suspect ATN related to hypotension.    She seems to be euvolemic.  I am going to give 250 cc fluid bolus given CKD.   Holding torsemide.  Holding ACE inhibitor.    HTN:  Hypotensive.  Holding betaa blocker, amiodarone, torsemide, lisinopril.    HYPOTHYROID:    TSH elevated but T4 is normal.  Continue current therapy.    ANEMIA:  Chronic and stable.  Hbg is actually up from previous.    DM:   Add sliding scale.    ELEVATED TROPONIN:  I do not suspect that this is clinically significant.  Doubt ongoing ischemia.        Jeneen Rinks [facility] 01/13/2016 7:11 AM

## 2016-01-14 DIAGNOSIS — I480 Paroxysmal atrial fibrillation: Secondary | ICD-10-CM

## 2016-01-14 LAB — GLUCOSE, CAPILLARY
GLUCOSE-CAPILLARY: 172 mg/dL — AB (ref 65–99)
Glucose-Capillary: 149 mg/dL — ABNORMAL HIGH (ref 65–99)
Glucose-Capillary: 205 mg/dL — ABNORMAL HIGH (ref 65–99)
Glucose-Capillary: 227 mg/dL — ABNORMAL HIGH (ref 65–99)

## 2016-01-14 LAB — BASIC METABOLIC PANEL
Anion gap: 6 (ref 5–15)
BUN: 46 mg/dL — ABNORMAL HIGH (ref 6–20)
CHLORIDE: 105 mmol/L (ref 101–111)
CO2: 28 mmol/L (ref 22–32)
Calcium: 8.3 mg/dL — ABNORMAL LOW (ref 8.9–10.3)
Creatinine, Ser: 1.81 mg/dL — ABNORMAL HIGH (ref 0.44–1.00)
GFR calc non Af Amer: 31 mL/min — ABNORMAL LOW (ref >60)
GFR, EST AFRICAN AMERICAN: 36 mL/min — AB (ref >60)
Glucose, Bld: 162 mg/dL — ABNORMAL HIGH (ref 65–99)
POTASSIUM: 4.9 mmol/L (ref 3.5–5.1)
SODIUM: 139 mmol/L (ref 135–145)

## 2016-01-14 MED ORDER — AMIODARONE HCL 100 MG PO TABS
100.0000 mg | ORAL_TABLET | Freq: Every day | ORAL | Status: DC
  Administered 2016-01-14 – 2016-01-18 (×4): 100 mg via ORAL
  Filled 2016-01-14 (×5): qty 1

## 2016-01-14 MED ORDER — ALPRAZOLAM 0.25 MG PO TABS
0.2500 mg | ORAL_TABLET | Freq: Three times a day (TID) | ORAL | Status: DC | PRN
  Administered 2016-01-14 – 2016-01-17 (×7): 0.25 mg via ORAL
  Filled 2016-01-14 (×7): qty 1

## 2016-01-14 MED ORDER — HYDRALAZINE HCL 20 MG/ML IJ SOLN: 5.0000 mg | Freq: Three times a day (TID) | INTRAMUSCULAR | Status: DC | PRN

## 2016-01-14 NOTE — Progress Notes (Signed)
SUBJECTIVE:  Agitated this morning.  No SOB.[name]  No pain.   PHYSICAL EXAM Filed Vitals:   01/14/16 0200 01/14/16 0300 01/14/16 0340 01/14/16 0400  BP: 130/84 143/125  146/72  Pulse: 72 71  71  Temp:   98.8 F (37.1 C)   TempSrc:   Oral   Resp: 15 21  15   Height:      Weight:   252 lb 3.3 oz (114.4 kg)   SpO2: 100% 100%  100%   General:  No acute distress Lungs:  Clear Heart:  RRR Abdomen:  Positive bowel sounds, no rebound no guarding Extremities:  Trace edema Neuro:  Nonfocal  LABS:  Results for orders placed or performed during the hospital encounter of 01/12/16 (from the past 24 hour(s))  Troponin I     Status: Abnormal   Collection Time: 01/13/16  7:56 AM  Result Value Ref Range   Troponin I 0.04 (H) <0.031 ng/mL  Basic metabolic panel     Status: Abnormal   Collection Time: 01/13/16  7:56 AM  Result Value Ref Range   Sodium 137 135 - 145 mmol/L   Potassium 5.8 (H) 3.5 - 5.1 mmol/L   Chloride 103 101 - 111 mmol/L   CO2 23 22 - 32 mmol/L   Glucose, Bld 216 (H) 65 - 99 mg/dL   BUN 49 (H) 6 - 20 mg/dL   Creatinine, Ser 2.42 (H) 0.44 - 1.00 mg/dL   Calcium 8.0 (L) 8.9 - 10.3 mg/dL   GFR calc non Af Amer 22 (L) >60 mL/min   GFR calc Af Amer 25 (L) >60 mL/min   Anion gap 11 5 - 15  CBC     Status: Abnormal   Collection Time: 01/13/16  7:56 AM  Result Value Ref Range   WBC 10.7 (H) 4.0 - 10.5 K/uL   RBC 3.01 (L) 3.87 - 5.11 MIL/uL   Hemoglobin 8.7 (L) 12.0 - 15.0 g/dL   HCT 27.8 (L) 36.0 - 46.0 %   MCV 92.4 78.0 - 100.0 fL   MCH 28.9 26.0 - 34.0 pg   MCHC 31.3 30.0 - 36.0 g/dL   RDW 14.4 11.5 - 15.5 %   Platelets 222 150 - 400 K/uL  Glucose, capillary     Status: Abnormal   Collection Time: 01/13/16  8:30 AM  Result Value Ref Range   Glucose-Capillary 212 (H) 65 - 99 mg/dL  Glucose, capillary     Status: Abnormal   Collection Time: 01/13/16 11:16 AM  Result Value Ref Range   Glucose-Capillary 272 (H) 65 - 99 mg/dL   Comment 1 Capillary Specimen     Glucose, capillary     Status: Abnormal   Collection Time: 01/13/16  4:12 PM  Result Value Ref Range   Glucose-Capillary 211 (H) 65 - 99 mg/dL   Comment 1 Capillary Specimen   Basic metabolic panel     Status: Abnormal   Collection Time: 01/13/16  5:10 PM  Result Value Ref Range   Sodium 137 135 - 145 mmol/L   Potassium 5.8 (H) 3.5 - 5.1 mmol/L   Chloride 101 101 - 111 mmol/L   CO2 26 22 - 32 mmol/L   Glucose, Bld 220 (H) 65 - 99 mg/dL   BUN 53 (H) 6 - 20 mg/dL   Creatinine, Ser 2.68 (H) 0.44 - 1.00 mg/dL   Calcium 8.2 (L) 8.9 - 10.3 mg/dL   GFR calc non Af Amer 19 (L) >60 mL/min   GFR  calc Af Amer 22 (L) >60 mL/min   Anion gap 10 5 - 15  Glucose, capillary     Status: Abnormal   Collection Time: 01/13/16  9:26 PM  Result Value Ref Range   Glucose-Capillary 161 (H) 65 - 99 mg/dL  Basic metabolic panel     Status: Abnormal   Collection Time: 01/14/16  3:40 AM  Result Value Ref Range   Sodium 139 135 - 145 mmol/L   Potassium 4.9 3.5 - 5.1 mmol/L   Chloride 105 101 - 111 mmol/L   CO2 28 22 - 32 mmol/L   Glucose, Bld 162 (H) 65 - 99 mg/dL   BUN 46 (H) 6 - 20 mg/dL   Creatinine, Ser 1.81 (H) 0.44 - 1.00 mg/dL   Calcium 8.3 (L) 8.9 - 10.3 mg/dL   GFR calc non Af Amer 31 (L) >60 mL/min   GFR calc Af Amer 36 (L) >60 mL/min   Anion gap 6 5 - 15    Intake/Output Summary (Last 24 hours) at 01/14/16 0739 Last data filed at 01/14/16 0700  Gross per 24 hour  Intake    835 ml  Output   1200 ml  Net   -365 ml    ASSESSMENT AND PLAN:  SYMPTOMATIC BRADYCARDIA:    Now NSR rate 60s.  Holding amiodarone but will likely restart in AM.  Hold beta blocker.  Med compliance is an issue.      ATRIAL FIB:    NSR, restart Xarelto in the AM.    ACUTE ON CHRONIC SYSTOLIC FAILURE:  Euvolemic.  Given some fluids yesterday.  I/O likely incomplete.  Maintain even I/Os today.     HTN:  BP improved.  Holding vasoactive meds.   Will use PRN hydralazine until she is back on some of her pervious  meds.  CKD:  Creat is improved.  Follow.  Suspect ATN from hypotension.   HYPOTHYROID:    TSH elevated but T4 is normal.  Continue current therapy.    ANEMIA:  Chronic and stable.  Check CBC in AM.    DM:   Added sliding scale.    ELEVATED TROPONIN:  I do not suspect that this is clinically significant.  Doubt ongoing ischemia.        Minus Breeding 01/14/2016 7:39 AM

## 2016-01-14 NOTE — Progress Notes (Signed)
SUBJECTIVE: The patient states she is improved today. Nausea improved. At this time, she denies chest pain, shortness of breath, or any new concerns.  CURRENT MEDICATIONS: . aspirin EC  81 mg Oral Daily  . atorvastatin  40 mg Oral Daily  . cholecalciferol  2,000 Units Oral Daily  . folic acid  1 mg Oral Daily  . Gabapentin (Once-Daily)  1,200 mg Oral Daily  . glipiZIDE  20 mg Oral Q breakfast  . insulin aspart  0-15 Units Subcutaneous TID WC  . insulin glargine  70 Units Subcutaneous QHS  . levothyroxine  50 mcg Oral QAC breakfast  . methocarbamol  500 mg Oral BID  . mometasone-formoterol  2 puff Inhalation BID  . pantoprazole  40 mg Oral Daily  . tiotropium  18 mcg Inhalation Daily  . vitamin B-12  1,000 mcg Oral Daily   . sodium chloride 10 mL/hr at 01/13/16 1800    OBJECTIVE: Physical Exam: Filed Vitals:   01/14/16 0200 01/14/16 0300 01/14/16 0340 01/14/16 0400  BP: 130/84 143/125  146/72  Pulse: 72 71  71  Temp:   98.8 F (37.1 C)   TempSrc:   Oral   Resp: 15 21  15   Height:      Weight:   252 lb 3.3 oz (114.4 kg)   SpO2: 100% 100%  100%    Intake/Output Summary (Last 24 hours) at 01/14/16 0718 Last data filed at 01/14/16 0342  Gross per 24 hour  Intake    725 ml  Output    800 ml  Net    -75 ml    Telemetry reveals sinus rhythm 70s  GEN- The patient is obese and chronically ill appearing, alert and oriented x 3 today.   Head- normocephalic, atraumatic Eyes-  Sclera clear, conjunctiva pink Ears- hearing intact Oropharynx- clear Neck- supple, RIJ temp PPM Lungs- Clear to ausculation bilaterally, normal work of breathing Heart- Regular rate and rhythm  GI- soft, NT, ND, + BS Extremities- no clubbing, cyanosis, 1+ BLE edema Skin- no rash or lesion Psych- euthymic mood, full affect Neuro- strength and sensation are intact  LABS: Basic Metabolic Panel:  Recent Labs  01/12/16 1558  01/12/16 2055  01/13/16 1710 01/14/16 0340  NA  --   < >  --    < > 137 139  K  --   < >  --   < > 5.8* 4.9  CL  --   < >  --   < > 101 105  CO2  --   --   --   < > 26 28  GLUCOSE  --   < >  --   < > 220* 162*  BUN  --   < >  --   < > 53* 46*  CREATININE  --   < >  --   < > 2.68* 1.81*  CALCIUM  --   --   --   < > 8.2* 8.3*  MG 1.9  --  2.0  --   --   --   < > = values in this interval not displayed. Liver Function Tests:  Recent Labs  01/12/16 2055  AST 40  ALT 67*  ALKPHOS 199*  BILITOT 0.5  PROT 6.4*  ALBUMIN 2.8*   CBC:  Recent Labs  01/12/16 1558 01/12/16 1612 01/13/16 0756  WBC 11.6*  --  10.7*  NEUTROABS 7.9*  --   --   HGB 9.1* 10.2* 8.7*  HCT 30.2* 30.0* 27.8*  MCV 91.0  --  92.4  PLT 319  --  222   Cardiac Enzymes:  Recent Labs  01/12/16 2055 01/13/16 0215 01/13/16 0756  TROPONINI 0.04* 0.05* 0.04*   Thyroid Function Tests:  Recent Labs  01/12/16 2100  TSH 4.112    RADIOLOGY: Dg Chest Port 1 View 01/12/2016  CLINICAL DATA:  Cardiac arrhythmia with temporary pacemaker insertion. EXAM: PORTABLE CHEST 1 VIEW COMPARISON:  Study obtained earlier in the day FINDINGS: The temporary pacemaker lead is attached to the right ventricle. No pneumothorax. There is no edema or consolidation. Heart is mildly enlarged with pulmonary vascularity within normal limits. No adenopathy. No bone lesions. There are surgical clips in the left neck region. IMPRESSION: Pacemaker lead tip at level of right ventricle. No pneumothorax. Stable cardiac prominence. No edema or consolidation. Electronically Signed   By: Lowella Grip III M.D.   On: 01/12/2016 22:01   ASSESSMENT AND PLAN:  Principal Problem:   Symptomatic bradycardia Active Problems:   DM (diabetes mellitus), type 2 with peripheral vascular complications ([place])   Long term current use of anticoagulant therapy   Hypothyroidism   S/P peripheral artery angioplasty - TurboHawk atherectomy; R SFA   PAF (paroxysmal atrial fibrillation) ([name])   Carotid arterial disease ([name])    COPD (chronic obstructive pulmonary disease) ([place])   Essential hypertension  1.  Junctional bradycardia The patient presented with symptomatic junctional bradycardia in the setting of BB, CCB, and amiodarone.   Temp wire was placed on admission This morning, pt SR in the 60's - pacer reprogrammed to VVI 40.  If rhythm stable today, can prob DC temp pacer tomorrow  2. Persistent atrial fibrillation Currently maintaining SR Resume Xarelto when temp pacer discontinued  CHADS2VASC is 5  3.  Acute on chronic renal insufficiency Improved today Monitor for now Repeat BMET tomorrow  4.  Chronic systolic heart failure/presumed NICM Euvolemic on exam BB on hold with bradycardia ACE-I on hold with AKI  Dr Rayann Heman to see later today  Chanetta Marshall, NP 01/14/2016 7:32 AM  I have seen, examined the patient, and reviewed the above assessment and plan.  On exam, RRR. Changes to above are made where necessary.   No indication for pacing at this time.  Based on conversations with Dr Aundra Dubin, it would be preferred to avoid procedures if possible.  I do think that her overall prognosis is quite poor. Will restart amiodarone 100mg  daily and avoid any other AV nodal agents if we can going forward. Anticipate pulling temp pacing wire in am if rhythm remains stable.  Electrophysiology team to see as needed while here. Please call with questions.   Co Sign: Thompson Grayer, MD 01/14/2016 9:45 AM

## 2016-01-15 ENCOUNTER — Encounter (HOSPITAL_COMMUNITY): Payer: Self-pay | Admitting: Cardiology

## 2016-01-15 LAB — GLUCOSE, CAPILLARY
GLUCOSE-CAPILLARY: 227 mg/dL — AB (ref 65–99)
GLUCOSE-CAPILLARY: 142 mg/dL — AB (ref 65–99)
GLUCOSE-CAPILLARY: 159 mg/dL — AB (ref 65–99)
Glucose-Capillary: 173 mg/dL — ABNORMAL HIGH (ref 65–99)

## 2016-01-15 LAB — BASIC METABOLIC PANEL
ANION GAP: 11 (ref 5–15)
BUN: 28 mg/dL — ABNORMAL HIGH (ref 6–20)
CALCIUM: 8.5 mg/dL — AB (ref 8.9–10.3)
CHLORIDE: 100 mmol/L — AB (ref 101–111)
CO2: 27 mmol/L (ref 22–32)
Creatinine, Ser: 1.09 mg/dL — ABNORMAL HIGH (ref 0.44–1.00)
GFR calc non Af Amer: 57 mL/min — ABNORMAL LOW (ref >60)
Glucose, Bld: 188 mg/dL — ABNORMAL HIGH (ref 65–99)
Potassium: 5 mmol/L (ref 3.5–5.1)
Sodium: 138 mmol/L (ref 135–145)

## 2016-01-15 LAB — CBC
HCT: 25.7 % — ABNORMAL LOW (ref 36.0–46.0)
HEMOGLOBIN: 7.6 g/dL — AB (ref 12.0–15.0)
MCH: 27 pg (ref 26.0–34.0)
MCHC: 29.6 g/dL — ABNORMAL LOW (ref 30.0–36.0)
MCV: 91.5 fL (ref 78.0–100.0)
Platelets: 220 10*3/uL (ref 150–400)
RBC: 2.81 MIL/uL — AB (ref 3.87–5.11)
RDW: 14.3 % (ref 11.5–15.5)
WBC: 11.7 10*3/uL — ABNORMAL HIGH (ref 4.0–10.5)

## 2016-01-15 MED ORDER — GABAPENTIN 100 MG PO CAPS
300.0000 mg | ORAL_CAPSULE | Freq: Three times a day (TID) | ORAL | Status: DC
  Administered 2016-01-15 – 2016-01-18 (×10): 300 mg via ORAL
  Filled 2016-01-15 (×10): qty 3

## 2016-01-15 MED ORDER — RIVAROXABAN 20 MG PO TABS
20.0000 mg | ORAL_TABLET | Freq: Every day | ORAL | Status: DC
  Administered 2016-01-15 – 2016-01-17 (×3): 20 mg via ORAL
  Filled 2016-01-15 (×3): qty 1

## 2016-01-15 MED ORDER — LISINOPRIL 2.5 MG PO TABS
2.5000 mg | ORAL_TABLET | Freq: Every day | ORAL | Status: DC
  Administered 2016-01-15 – 2016-01-18 (×4): 2.5 mg via ORAL
  Filled 2016-01-15 (×4): qty 1

## 2016-01-15 MED ORDER — BISOPROLOL FUMARATE 5 MG PO TABS
2.5000 mg | ORAL_TABLET | Freq: Every day | ORAL | Status: DC
  Administered 2016-01-15 – 2016-01-18 (×3): 2.5 mg via ORAL
  Filled 2016-01-15 (×4): qty 1

## 2016-01-15 NOTE — Progress Notes (Signed)
SUBJECTIVE:  Agitated again this morning.  No SOB.  No pain.   PHYSICAL EXAM Filed Vitals:   01/15/16 0100 01/15/16 0200 01/15/16 0300 01/15/16 0500  BP: 126/52 113/51 153/63 145/82  Pulse: 78 72 79 80  Temp:      TempSrc:      Resp: 17 17 16 17   Height:      Weight:      SpO2: 97% 99% 100% 87%   General:  No acute distress Lungs:  Clear Heart:  RRR Abdomen:  Positive bowel sounds, no rebound no guarding Extremities:  Trace edema Neuro:  Nonfocal  LABS:  Results for orders placed or performed during the hospital encounter of 01/12/16 (from the past 24 hour(s))  Glucose, capillary     Status: Abnormal   Collection Time: 01/14/16  8:36 AM  Result Value Ref Range   Glucose-Capillary 172 (H) 65 - 99 mg/dL  Glucose, capillary     Status: Abnormal   Collection Time: 01/14/16 11:31 AM  Result Value Ref Range   Glucose-Capillary 149 (H) 65 - 99 mg/dL   Comment 1 Capillary Specimen   Glucose, capillary     Status: Abnormal   Collection Time: 01/14/16  3:56 PM  Result Value Ref Range   Glucose-Capillary 205 (H) 65 - 99 mg/dL  Glucose, capillary     Status: Abnormal   Collection Time: 01/14/16 11:29 PM  Result Value Ref Range   Glucose-Capillary 227 (H) 65 - 99 mg/dL   Comment 1 Capillary Specimen   Basic metabolic panel     Status: Abnormal   Collection Time: 01/15/16  5:45 AM  Result Value Ref Range   Sodium 138 135 - 145 mmol/L   Potassium 5.0 3.5 - 5.1 mmol/L   Chloride 100 (L) 101 - 111 mmol/L   CO2 27 22 - 32 mmol/L   Glucose, Bld 188 (H) 65 - 99 mg/dL   BUN 28 (H) 6 - 20 mg/dL   Creatinine, Ser 1.09 (H) 0.44 - 1.00 mg/dL   Calcium 8.5 (L) 8.9 - 10.3 mg/dL   GFR calc non Af Amer 57 (L) >60 mL/min   GFR calc Af Amer >60 >60 mL/min   Anion gap 11 5 - 15  CBC     Status: Abnormal   Collection Time: 01/15/16  5:45 AM  Result Value Ref Range   WBC 11.7 (H) 4.0 - 10.5 K/uL   RBC 2.81 (L) 3.87 - 5.11 MIL/uL   Hemoglobin 7.6 (L) 12.0 - 15.0 g/dL   HCT 25.7 (L)  36.0 - 46.0 %   MCV 91.5 78.0 - 100.0 fL   MCH 27.0 26.0 - 34.0 pg   MCHC 29.6 (L) 30.0 - 36.0 g/dL   RDW 14.3 11.5 - 15.5 %   Platelets 220 150 - 400 K/uL    Intake/Output Summary (Last 24 hours) at 01/15/16 0750 Last data filed at 01/15/16 0500  Gross per 24 hour  Intake   1500 ml  Output   1050 ml  Net    450 ml    ASSESSMENT AND PLAN:  SYMPTOMATIC BRADYCARDIA:    Now NSR rate 90s.  I will take out the pacemaker.    ATRIAL FIB:    NSR, restart Xarelto after sheath is out.    ACUTE ON CHRONIC SYSTOLIC FAILURE:  Euvolemic.   I/O incomplete.   Holding diuretics  HTN:  BP improved.  Restart half dose of previous ACE inhibitor.  Restart lower dose beta blocker.  CKD:  Creat is returned to baseline.  I will restart lower dose ACE inhibitor.  BPs have been for the most part high but fluctuating.    HYPOTHYROID:    TSH elevated but T4 is normal.  Continue current therapy.    ANEMIA:  Chronic and stable.  Hgb is falling.  No active signs of anemia but I will check a stool guaiac and follow the CBC.    DM:   Added sliding scale.    ELEVATED TROPONIN:  I do not suspect that this is clinically significant.  Doubt ongoing ischemia.        Minus Breeding 01/15/2016 7:50 AM

## 2016-01-15 NOTE — Care Management Important Message (Signed)
Important Message  Patient Details  Name: [name] MRN: [id number] Date of Birth: [birth year]   Medicare Important Message Given:  Yes    Jakim Drapeau P Cleland Simkins 01/15/2016, 3:59 PM

## 2016-01-16 DIAGNOSIS — Z7901 Long term (current) use of anticoagulants: Secondary | ICD-10-CM

## 2016-01-16 LAB — GLUCOSE, CAPILLARY
GLUCOSE-CAPILLARY: 124 mg/dL — AB (ref 65–99)
GLUCOSE-CAPILLARY: 178 mg/dL — AB (ref 65–99)
GLUCOSE-CAPILLARY: 230 mg/dL — AB (ref 65–99)
GLUCOSE-CAPILLARY: 206 mg/dL — AB (ref 65–99)

## 2016-01-16 LAB — CBC
HCT: 26.3 % — ABNORMAL LOW (ref 36.0–46.0)
Hemoglobin: 8.2 g/dL — ABNORMAL LOW (ref 12.0–15.0)
MCH: 28.4 pg (ref 26.0–34.0)
MCHC: 31.2 g/dL (ref 30.0–36.0)
MCV: 91 fL (ref 78.0–100.0)
PLATELETS: 214 10*3/uL (ref 150–400)
RBC: 2.89 MIL/uL — AB (ref 3.87–5.11)
RDW: 14.5 % (ref 11.5–15.5)
WBC: 11.2 10*3/uL — AB (ref 4.0–10.5)
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
## 2016-01-17 DIAGNOSIS — I5043 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) and diastolic (congestive) heart failure: Secondary | ICD-10-CM

## 2016-01-17 DIAGNOSIS — I10 Essential (primary) hypertension: Secondary | ICD-10-CM

## 2016-01-17 LAB — GLUCOSE, CAPILLARY
GLUCOSE-CAPILLARY: 203 mg/dL — AB (ref 65–99)
GLUCOSE-CAPILLARY: 278 mg/dL — AB (ref 65–99)
Glucose-Capillary: 161 mg/dL — ABNORMAL HIGH (ref 65–99)
Glucose-Capillary: 204 mg/dL — ABNORMAL HIGH (ref 65–99)

## 2016-01-17 MED ORDER — TORSEMIDE 20 MG PO TABS: 40.0000 mg | ORAL_TABLET | Freq: Two times a day (BID) | ORAL | Status: DC

## 2016-01-17 MED ORDER — TORSEMIDE 20 MG PO TABS
40.0000 mg | ORAL_TABLET | Freq: Every day | ORAL | Status: DC
  Administered 2016-01-17: 40 mg via ORAL
  Filled 2016-01-17: qty 2

## 2016-01-17 MED ORDER — TORSEMIDE 20 MG PO TABS
60.0000 mg | ORAL_TABLET | Freq: Every day | ORAL | Status: DC
  Administered 2016-01-17 – 2016-01-18 (×2): 60 mg via ORAL
  Filled 2016-01-17 (×2): qty 3

## 2016-01-17 NOTE — Progress Notes (Signed)
Call from telemetry that patient is going in and out of junctional rhythm and sinus brady both in mid 50's. PA notified. PA verbal order to hold amio and beta blocker at this time. Will continue to monitor.

## 2016-01-17 NOTE — Progress Notes (Signed)
Subjective: No complaints of shortness of breath or chest pain. patient feels she is at baseline  Objective: Vital signs in last 24 hours: Temp:  [97.3 F (36.3 C)-98.6 F (37 C)] 97.8 F (36.6 C) (03/05 0841) Pulse Rate:  [59-75] 60 (03/05 0841) Resp:  [16-19] 16 (03/05 0841) BP: (121-146)/(56-68) 130/68 mmHg (03/05 0841) SpO2:  [92 %-100 %] 99 % (03/05 0442) Weight:  [247 lb 12.8 oz (112.401 kg)] 247 lb 12.8 oz (112.401 kg) (03/05 0446) Last BM Date: 01/16/16  Intake/Output from previous day: 03/04 0701 - 03/05 0700 In: 1800 [P.O.:1800] Out: 1375 [Urine:1375] Intake/Output this shift:    Medications Scheduled Meds: . amiodarone  100 mg Oral Daily  . aspirin EC  81 mg Oral Daily  . atorvastatin  40 mg Oral Daily  . bisoprolol  2.5 mg Oral Daily  . cholecalciferol  2,000 Units Oral Daily  . folic acid  1 mg Oral Daily  . gabapentin  300 mg Oral TID  . glipiZIDE  20 mg Oral Q breakfast  . insulin aspart  0-15 Units Subcutaneous TID WC  . insulin glargine  70 Units Subcutaneous QHS  . levothyroxine  50 mcg Oral QAC breakfast  . lisinopril  2.5 mg Oral Daily  . methocarbamol  500 mg Oral BID  . mometasone-formoterol  2 puff Inhalation BID  . pantoprazole  40 mg Oral Daily  . rivaroxaban  20 mg Oral Q supper  . tiotropium  18 mcg Inhalation Daily  . vitamin B-12  1,000 mcg Oral Daily   Continuous Infusions: . sodium chloride Stopped (01/15/16 0830)   PRN Meds:.acetaminophen, albuterol, ALPRAZolam, hydrALAZINE, HYDROcodone-acetaminophen, morphine injection, nitroGLYCERIN, nitroGLYCERIN, ondansetron (ZOFRAN) IV  PE: General appearance: alert, cooperative and no distress Neck: elevated JVD Lungs: fine crackles and bases Heart: regular rate and rhythm Extremities: 1+ LEE Pulses: 2+ left radial absent right radial.  Right hand warm.  One + DPs Skin: Warm and dry Neurologic: Grossly normal   Lab Results:   Recent Labs  01/15/16 0545 01/16/16 0504  WBC  11.7* 11.2*  HGB 7.6* 8.2*  HCT 25.7* 26.3*  PLT 220 214   BMET  Recent Labs  01/15/16 0545  NA 138  K 5.0  CL 100*  CO2 27  GLUCOSE 188*  BUN 28*  CREATININE 1.09*  CALCIUM 8.5*    Assessment/Plan    Symptomatic bradycardia  Temp PPM removed. Rhythm stable on telemetry.  Low-dose beta blocker.    Acute on chronic combined sys and diastolic heart failure Net fluids:0.4L/0.5L.  Weight is all over-242 at adm now 247. Ejection fraction by echo in January 2017 was 45%. Grade 2 diastolic dysfunction. Inferior lateral hypokinesis.  Left and right atrium are mildly dilated.  She looks volume overloaded.  JVD is elevated with fine rales and 1+ LEE.  She has not had any diuretic since adm on Feb 28.   SCr has improved.  She takes torsemide 60 QAM and 40QPM at home.  Will restart home dosing this morning.      Acute kidney injury  SCr improved from  was mildly 2.68 to 1.09.      DM (diabetes mellitus), type 2 with peripheral vascular complications ([name])   Anemia  Hgb yesterday 7.6 and today 8.2.  No overt signs of bleeding.     Long term current use of anticoagulant therapy   Hypothyroidism   S/P peripheral artery angioplasty - TurboHawk atherectomy; R SFA   PAF (paroxysmal atrial fibrillation) ([place])  Maintaining SR.  Amiodarone 100 daily, bisoprolol 2.5, Xarelto 20,    Carotid arterial disease ([name])   COPD (chronic obstructive pulmonary disease) ([place])  On home O2.     Essential hypertension  Overall controlled.  Restarting torsemide.     LOS: 5 days    HAGER, BRYAN PA-C 01/17/2016 9:21 AM  I have seen and examined the patient along with HAGER, BRYAN PA-C.  I have reviewed the chart, notes and new data.  I agree with PA's note.   PLAN: Clinical exam shows hypervolemia (JVD, leg edema) and there has been a remarkable improvement in renal function. She has baseline NYHA class 2 exertional dyspnea. Restart diuretics, possibly home tomorrow if she responds well and renal  function remains stable.  Sanda Klein, MD, [place] [phone] 01/17/2016, 10:09 AM

## 2016-01-18 ENCOUNTER — Other Ambulatory Visit: Payer: Self-pay | Admitting: Physician Assistant

## 2016-01-18 DIAGNOSIS — N179 Acute kidney failure, unspecified: Secondary | ICD-10-CM

## 2016-01-18 LAB — BASIC METABOLIC PANEL
Anion gap: 12 (ref 5–15)
BUN: 41 mg/dL — ABNORMAL HIGH (ref 6–20)
CHLORIDE: 94 mmol/L — AB (ref 101–111)
CO2: 30 mmol/L (ref 22–32)
CREATININE: 1.22 mg/dL — AB (ref 0.44–1.00)
Calcium: 8.9 mg/dL (ref 8.9–10.3)
GFR calc non Af Amer: 50 mL/min — ABNORMAL LOW (ref >60)
GFR, EST AFRICAN AMERICAN: 58 mL/min — AB (ref >60)
GLUCOSE: 192 mg/dL — AB (ref 65–99)
Potassium: 4.6 mmol/L (ref 3.5–5.1)
Sodium: 136 mmol/L (ref 135–145)

## 2016-01-18 LAB — GLUCOSE, CAPILLARY
GLUCOSE-CAPILLARY: 79 mg/dL (ref 65–99)
Glucose-Capillary: 211 mg/dL — ABNORMAL HIGH (ref 65–99)

## 2016-01-18 MED ORDER — AMIODARONE HCL 100 MG PO TABS: 100.0000 mg | ORAL_TABLET | Freq: Every day | ORAL | Status: DC

## 2016-01-18 MED ORDER — LISINOPRIL 2.5 MG PO TABS: 2.5000 mg | ORAL_TABLET | Freq: Every day | ORAL | Status: DC

## 2016-01-18 MED ORDER — ASPIRIN 81 MG PO TBEC: 81.0000 mg | DELAYED_RELEASE_TABLET | Freq: Every day | ORAL | Status: DC

## 2016-01-18 NOTE — Plan of Care (Signed)
Problem: Education: Goal: Knowledge of [facility] Education information/materials will improve Outcome: Completed/Met Date Met:  01/18/16 Pt received information throughout hospitalization regarding medications, procedures, and tests.   Problem: Safety: Goal: Ability to remain free from injury will improve Outcome: Completed/Met Date Met:  01/18/16 Pt remained free from injury during this admission  Problem: Pain Managment: Goal: General experience of comfort will improve Outcome: Completed/Met Date Met:  01/18/16 Pt general experience of comfort is improving   Problem: Physical Regulation: Goal: Will remain free from infection Outcome: Completed/Met Date Met:  01/18/16 Pt remains free from infection   Problem: Skin Integrity: Goal: Risk for impaired skin integrity will decrease Outcome: Completed/Met Date Met:  01/18/16 Pt skin remains in tact   Problem: Activity: Goal: Risk for activity intolerance will decrease Outcome: Completed/Met Date Met:  01/18/16 Pt able to tolerate increased activity   Problem: Fluid Volume: Goal: Ability to maintain a balanced intake and output will improve Outcome: Completed/Met Date Met:  01/18/16 Pt intake and output is adequate   Problem: Nutrition: Goal: Adequate nutrition will be maintained Outcome: Completed/Met Date Met:  01/18/16 Pt able to tolerate current diet with no difficulties
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
## 2016-01-18 NOTE — Care Management Note (Signed)
Case Management Note  Patient Details  Name: [name] MRN: [id number] Date of Birth: Jul 25, 1961  Subjective/Objective:  Pt admitted for bradycardia. Plan for d/c home Previously active with [facility] for RN, SW and PT Services.                   Action/Plan: Pt will need HH resumption orders. CM did make [facility] aware of d/c. No further needs from CM at this time.    Expected Discharge Date:                  Expected Discharge Plan:  [place]  In-House Referral:  NA  Discharge planning Services  CM Consult  Post Acute Care Choice:  Resumption of Svcs/PTA Provider, Home Health Choice offered to:  Patient  DME Arranged:  N/A DME Agency:  NA  HH Arranged:  RN, PT, Social Work [employer] Agency:  [place]  Status of Service:  Completed, signed off  Medicare Important Message Given:  Yes Date Medicare IM Given:    Medicare IM give by:    Date Additional Medicare IM Given:    Additional Medicare Important Message give by:     If discussed at [place] of Stay Meetings, dates discussed:    Additional Comments:  Bethena Roys, RN 01/18/2016, 12:05 PM

## 2016-01-18 NOTE — Progress Notes (Signed)
    Subjective: Denies dyspnea or chest pain  Objective: Vital signs in last 24 hours: Temp:  [97.8 F (36.6 C)-98.4 F (36.9 C)] 98.4 F (36.9 C) (03/06 0400) Pulse Rate:  [65-78] 78 (03/06 0400) Resp:  [18-20] 20 (03/06 0400) BP: (122-142)/(54-81) 122/81 mmHg (03/06 0400) SpO2:  [95 %-100 %] 100 % (03/06 0400) Weight:  [240 lb 11.9 oz (109.2 kg)] 240 lb 11.9 oz (109.2 kg) (03/06 0400) Last BM Date: 01/17/16  Intake/Output from previous day: 03/05 0701 - 03/06 0700 In: 1440 [P.O.:1440] Out: 5150 [Urine:5150] Intake/Output this shift:    Medications Scheduled Meds: . amiodarone  100 mg Oral Daily  . aspirin EC  81 mg Oral Daily  . atorvastatin  40 mg Oral Daily  . bisoprolol  2.5 mg Oral Daily  . cholecalciferol  2,000 Units Oral Daily  . folic acid  1 mg Oral Daily  . gabapentin  300 mg Oral TID  . glipiZIDE  20 mg Oral Q breakfast  . insulin aspart  0-15 Units Subcutaneous TID WC  . insulin glargine  70 Units Subcutaneous QHS  . levothyroxine  50 mcg Oral QAC breakfast  . lisinopril  2.5 mg Oral Daily  . methocarbamol  500 mg Oral BID  . mometasone-formoterol  2 puff Inhalation BID  . pantoprazole  40 mg Oral Daily  . rivaroxaban  20 mg Oral Q supper  . tiotropium  18 mcg Inhalation Daily  . torsemide  60 mg Oral QAC breakfast   And  . torsemide  40 mg Oral q1800  . vitamin B-12  1,000 mcg Oral Daily   Continuous Infusions: . sodium chloride Stopped (01/15/16 0830)   PRN Meds:.acetaminophen, albuterol, ALPRAZolam, hydrALAZINE, HYDROcodone-acetaminophen, morphine injection, nitroGLYCERIN, nitroGLYCERIN, ondansetron (ZOFRAN) IV  PE: General appearance: alert, cooperative and no distress Neck: supple Lungs: CTA Heart: regular rate and rhythm Abd: soft, not tender Extremities: 1+ LEE Skin: Warm and dry Neurologic: Grossly normal   Lab Results:   Recent Labs  01/16/16 0504  WBC 11.2*  HGB 8.2*  HCT 26.3*  PLT 214    Assessment/Plan   Symptomatic bradycardia  Temp PPM removed. Rhythm stable on telemetry.  Low-dose beta blocker.    Acute on chronic combined sys and diastolic heart failure Ejection fraction by echo in January 2017 was 45%. Grade 2 diastolic dysfunction. Inferior lateral hypokinesis.  Left and right atrium are mildly dilated. Home diuretics resumed; resume Kdur 20 meq BID.    Acute kidney injury  SCr improved    DM (diabetes mellitus), type 2 with peripheral vascular complications ([name])    Anemia  Hgb yesterday 7.6 and today 8.2.  No overt signs of bleeding.      S/P peripheral artery angioplasty - TurboHawk atherectomy; R SFA    PAF (paroxysmal atrial fibrillation) ([name])  Maintaining SR.  Amiodarone 100 daily, bisoprolol 2.5, Xarelto 20,     Carotid arterial disease ([name])    COPD (chronic obstructive pulmonary disease) ([place])  On home O2.      Essential hypertension  Overall controlled.  Plan DC today on present meds (amiodarone decreased and cardizem DCed since admission); check BMET Wed with results to Dr Gwenlyn Found. TOC appt 1 week. FU Dr Gwenlyn Found 6 weeks. >30 min PA and physician time D2  Kirk Ruths MD  01/18/2016 10:10 AM

## 2016-01-18 NOTE — Progress Notes (Signed)
Inpatient Diabetes Program Recommendations  AACE/ADA: New Consensus Statement on Inpatient Glycemic Control (2015)  Target Ranges:  Prepandial:   less than 140 mg/dL      Peak postprandial:   less than 180 mg/dL (1-2 hours)      Critically ill patients:  140 - 180 mg/dL   Results for [name], [name] (MRN [id number]) as of 01/18/2016 09:41  Ref. Range 01/17/2016 07:27 01/17/2016 11:14 01/17/2016 16:22 01/17/2016 20:58  Glucose-Capillary Latest Ref Range: 65-99 mg/dL 161 (H) 203 (H) 278 (H) 204 (H)    Home DM Meds: Lantus 70 units QHS       Novolog 6-14 units tidwc       Glipizide 20 mg daily  Current Insulin Orders: Lantus 70 units QHS      Novolog Moderate Correction Scale/ SSI (0-15 units) TID AC      Glipizide 20 mg daily       MD- Patient eating 100% of meals.  Having elevated postprandial glucose levels.  Please start Novolog Meal Coverage for this patient- Novolog 4 units tid with meals (hold if pt eats <50% of meal)      --Will follow patient during hospitalization--  Wyn Quaker RN, MSN, CDE Diabetes Coordinator Inpatient Glycemic Control Team Team Pager: [phone] (8a-5p)

## 2016-01-21 ENCOUNTER — Other Ambulatory Visit: Payer: Self-pay | Admitting: Family Medicine

## 2016-01-22 ENCOUNTER — Other Ambulatory Visit (HOSPITAL_COMMUNITY): Payer: Medicare Other

## 2016-01-25 ENCOUNTER — Ambulatory Visit (INDEPENDENT_AMBULATORY_CARE_PROVIDER_SITE_OTHER): Payer: Medicare Other | Admitting: Nurse Practitioner

## 2016-01-25 ENCOUNTER — Encounter: Payer: Self-pay | Admitting: Nurse Practitioner

## 2016-01-25 ENCOUNTER — Other Ambulatory Visit (HOSPITAL_COMMUNITY): Payer: Medicare Other

## 2016-01-25 ENCOUNTER — Telehealth: Payer: Self-pay | Admitting: *Deleted

## 2016-01-25 VITALS — BP 160/80 | HR 68 | Resp 22 | Ht 64.0 in | Wt 239.4 lb

## 2016-01-25 DIAGNOSIS — Z79899 Other long term (current) drug therapy: Secondary | ICD-10-CM

## 2016-01-25 DIAGNOSIS — I6523 Occlusion and stenosis of bilateral carotid arteries: Secondary | ICD-10-CM

## 2016-01-25 LAB — CBC
HCT: 28.8 % — ABNORMAL LOW (ref 36.0–46.0)
Hemoglobin: 9.1 g/dL — ABNORMAL LOW (ref 12.0–15.0)
MCH: 27.2 pg (ref 26.0–34.0)
MCHC: 31.6 g/dL (ref 30.0–36.0)
MCV: 86.2 fL (ref 78.0–100.0)
MPV: 9.2 fL (ref 8.6–12.4)
Platelets: 381 10*3/uL (ref 150–400)
RBC: 3.34 MIL/uL — ABNORMAL LOW (ref 3.87–5.11)
RDW: 14.3 % (ref 11.5–15.5)
WBC: 9.4 10*3/uL (ref 4.0–10.5)

## 2016-01-25 LAB — BASIC METABOLIC PANEL
BUN: 27 mg/dL — ABNORMAL HIGH (ref 7–25)
CO2: 30 mmol/L (ref 20–31)
Calcium: 9.2 mg/dL (ref 8.6–10.4)
Chloride: 101 mmol/L (ref 98–110)
Creat: 1.09 mg/dL — ABNORMAL HIGH (ref 0.50–1.05)
Glucose, Bld: 76 mg/dL (ref 65–99)
Potassium: 3.9 mmol/L (ref 3.5–5.3)
Sodium: 140 mmol/L (ref 135–146)

## 2016-01-25 LAB — HEPATIC FUNCTION PANEL
ALT: 44 U/L — ABNORMAL HIGH (ref 6–29)
AST: 31 U/L (ref 10–35)
Albumin: 3.3 g/dL — ABNORMAL LOW (ref 3.6–5.1)
Alkaline Phosphatase: 234 U/L — ABNORMAL HIGH (ref 33–130)
Bilirubin, Direct: 0.1 mg/dL (ref <=0.2)
Indirect Bilirubin: 0.2 mg/dL (ref 0.2–1.2)
Total Bilirubin: 0.3 mg/dL (ref 0.2–1.2)
Total Protein: 6.9 g/dL (ref 6.1–8.1)
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
## 2016-01-25 NOTE — Patient Instructions (Addendum)
We will be checking the following labs today - BMET, CBC, HPF   Medication Instructions:    Continue with your current medicines.   GO BY THIS LIST OF MEDICINES THAT YOU ARE TO GO BY  STOP aspirin    Testing/Procedures To Be Arranged:  N/A  Follow-Up:   See Dr. Gwenlyn Found for discussion of your blood flow to your legs.     Other Special Instructions:   N/A    If you need a refill on your cardiac medications before your next appointment, please call your pharmacy.   Call the [place] office at [phone] if you have any questions, problems or concerns.

## 2016-01-26 ENCOUNTER — Other Ambulatory Visit: Payer: Self-pay

## 2016-01-26 DIAGNOSIS — Z1231 Encounter for screening mammogram for malignant neoplasm of breast: Secondary | ICD-10-CM

## 2016-01-26 NOTE — Telephone Encounter (Signed)
error 

## 2016-01-28 ENCOUNTER — Telehealth (HOSPITAL_COMMUNITY): Payer: Self-pay | Admitting: *Deleted

## 2016-01-28 MED ORDER — BISOPROLOL FUMARATE 5 MG PO TABS: 2.5000 mg | ORAL_TABLET | Freq: Every day | ORAL | Status: DC

## 2016-01-28 MED ORDER — POTASSIUM CHLORIDE CRYS ER 20 MEQ PO TBCR: 20.0000 meq | EXTENDED_RELEASE_TABLET | Freq: Two times a day (BID) | ORAL | Status: DC

## 2016-01-28 NOTE — Telephone Encounter (Signed)
[name], w/paramedicine called to verify pt's meds, she states pt has been taking Bisoprolol 2.5 mg and KCL 20 meq bid since hospital d/c and that is what the d/c summary shows, however the pt saw Truitt Merle on 3/13 and both meds were removed from med list so pt is not sure if she should continue taking or not.  Advised unsure why meds were stopped, there is no mention of meds being d/c'd and comment states "completed course" advised pt to continue taking meds, she is sch for f/u 3/31 and will keep that appt.

## 2016-02-03 ENCOUNTER — Inpatient Hospital Stay (HOSPITAL_COMMUNITY)
Admission: EM | Admit: 2016-02-03 | Discharge: 2016-02-08 | DRG: 917 | Disposition: A | Discharge status: Home Health | Payer: Medicare Other | Attending: Internal Medicine | Admitting: Internal Medicine

## 2016-02-03 ENCOUNTER — Encounter (HOSPITAL_COMMUNITY): Payer: Self-pay

## 2016-02-03 ENCOUNTER — Emergency Department (HOSPITAL_COMMUNITY): Payer: Medicare Other

## 2016-02-03 DIAGNOSIS — Z79899 Other long term (current) drug therapy: Secondary | ICD-10-CM

## 2016-02-03 DIAGNOSIS — Z9861 Coronary angioplasty status: Secondary | ICD-10-CM | POA: Diagnosis not present

## 2016-02-03 DIAGNOSIS — R001 Bradycardia, unspecified: Secondary | ICD-10-CM | POA: Diagnosis present

## 2016-02-03 DIAGNOSIS — E785 Hyperlipidemia, unspecified: Secondary | ICD-10-CM | POA: Diagnosis present

## 2016-02-03 DIAGNOSIS — E1165 Type 2 diabetes mellitus with hyperglycemia: Secondary | ICD-10-CM | POA: Diagnosis present

## 2016-02-03 DIAGNOSIS — N179 Acute kidney failure, unspecified: Secondary | ICD-10-CM

## 2016-02-03 DIAGNOSIS — I480 Paroxysmal atrial fibrillation: Secondary | ICD-10-CM | POA: Diagnosis present

## 2016-02-03 DIAGNOSIS — E669 Obesity, unspecified: Secondary | ICD-10-CM | POA: Diagnosis present

## 2016-02-03 DIAGNOSIS — Z7901 Long term (current) use of anticoagulants: Secondary | ICD-10-CM | POA: Diagnosis not present

## 2016-02-03 DIAGNOSIS — Z6841 Body Mass Index (BMI) 40.0 and over, adult: Secondary | ICD-10-CM

## 2016-02-03 DIAGNOSIS — I495 Sick sinus syndrome: Secondary | ICD-10-CM | POA: Diagnosis present

## 2016-02-03 DIAGNOSIS — I739 Peripheral vascular disease, unspecified: Secondary | ICD-10-CM | POA: Diagnosis present

## 2016-02-03 DIAGNOSIS — E538 Deficiency of other specified B group vitamins: Secondary | ICD-10-CM | POA: Diagnosis present

## 2016-02-03 DIAGNOSIS — E871 Hypo-osmolality and hyponatremia: Secondary | ICD-10-CM | POA: Diagnosis not present

## 2016-02-03 DIAGNOSIS — E875 Hyperkalemia: Secondary | ICD-10-CM | POA: Diagnosis present

## 2016-02-03 DIAGNOSIS — I2510 Atherosclerotic heart disease of native coronary artery without angina pectoris: Secondary | ICD-10-CM | POA: Diagnosis present

## 2016-02-03 DIAGNOSIS — E039 Hypothyroidism, unspecified: Secondary | ICD-10-CM | POA: Diagnosis present

## 2016-02-03 DIAGNOSIS — Z794 Long term (current) use of insulin: Secondary | ICD-10-CM | POA: Diagnosis not present

## 2016-02-03 DIAGNOSIS — I130 Hypertensive heart and chronic kidney disease with heart failure and stage 1 through stage 4 chronic kidney disease, or unspecified chronic kidney disease: Secondary | ICD-10-CM | POA: Diagnosis present

## 2016-02-03 DIAGNOSIS — I252 Old myocardial infarction: Secondary | ICD-10-CM

## 2016-02-03 DIAGNOSIS — I5042 Chronic combined systolic (congestive) and diastolic (congestive) heart failure: Secondary | ICD-10-CM | POA: Diagnosis present

## 2016-02-03 DIAGNOSIS — K7030 Alcoholic cirrhosis of liver without ascites: Secondary | ICD-10-CM | POA: Diagnosis present

## 2016-02-03 DIAGNOSIS — I481 Persistent atrial fibrillation: Secondary | ICD-10-CM | POA: Diagnosis not present

## 2016-02-03 DIAGNOSIS — F419 Anxiety disorder, unspecified: Secondary | ICD-10-CM | POA: Diagnosis present

## 2016-02-03 DIAGNOSIS — T461X1A Poisoning by calcium-channel blockers, accidental (unintentional), initial encounter: Principal | ICD-10-CM | POA: Diagnosis present

## 2016-02-03 DIAGNOSIS — D649 Anemia, unspecified: Secondary | ICD-10-CM | POA: Diagnosis present

## 2016-02-03 DIAGNOSIS — E1122 Type 2 diabetes mellitus with diabetic chronic kidney disease: Secondary | ICD-10-CM | POA: Diagnosis present

## 2016-02-03 DIAGNOSIS — E1151 Type 2 diabetes mellitus with diabetic peripheral angiopathy without gangrene: Secondary | ICD-10-CM | POA: Diagnosis present

## 2016-02-03 DIAGNOSIS — I519 Heart disease, unspecified: Secondary | ICD-10-CM | POA: Diagnosis not present

## 2016-02-03 DIAGNOSIS — K219 Gastro-esophageal reflux disease without esophagitis: Secondary | ICD-10-CM | POA: Diagnosis present

## 2016-02-03 DIAGNOSIS — J449 Chronic obstructive pulmonary disease, unspecified: Secondary | ICD-10-CM | POA: Diagnosis present

## 2016-02-03 DIAGNOSIS — Z7951 Long term (current) use of inhaled steroids: Secondary | ICD-10-CM | POA: Diagnosis not present

## 2016-02-03 DIAGNOSIS — I429 Cardiomyopathy, unspecified: Secondary | ICD-10-CM | POA: Diagnosis present

## 2016-02-03 DIAGNOSIS — E1140 Type 2 diabetes mellitus with diabetic neuropathy, unspecified: Secondary | ICD-10-CM | POA: Diagnosis not present

## 2016-02-03 DIAGNOSIS — Z87891 Personal history of nicotine dependence: Secondary | ICD-10-CM | POA: Diagnosis not present

## 2016-02-03 DIAGNOSIS — E1142 Type 2 diabetes mellitus with diabetic polyneuropathy: Secondary | ICD-10-CM | POA: Diagnosis present

## 2016-02-03 DIAGNOSIS — N170 Acute kidney failure with tubular necrosis: Secondary | ICD-10-CM | POA: Diagnosis present

## 2016-02-03 DIAGNOSIS — E119 Type 2 diabetes mellitus without complications: Secondary | ICD-10-CM | POA: Diagnosis present

## 2016-02-03 DIAGNOSIS — Z9119 Patient's noncompliance with other medical treatment and regimen: Secondary | ICD-10-CM | POA: Diagnosis not present

## 2016-02-03 DIAGNOSIS — I5043 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) and diastolic (congestive) heart failure: Secondary | ICD-10-CM

## 2016-02-03 DIAGNOSIS — E1169 Type 2 diabetes mellitus with other specified complication: Secondary | ICD-10-CM | POA: Diagnosis present

## 2016-02-03 DIAGNOSIS — E11649 Type 2 diabetes mellitus with hypoglycemia without coma: Secondary | ICD-10-CM | POA: Diagnosis not present

## 2016-02-03 DIAGNOSIS — N183 Chronic kidney disease, stage 3 (moderate): Secondary | ICD-10-CM | POA: Diagnosis present

## 2016-02-03 DIAGNOSIS — N189 Chronic kidney disease, unspecified: Secondary | ICD-10-CM

## 2016-02-03 HISTORY — DX: Bradycardia, unspecified: R00.1

## 2016-02-03 LAB — CBC WITH DIFFERENTIAL/PLATELET
Basophils Absolute: 0 10*3/uL (ref 0.0–0.1)
Basophils Relative: 0 %
EOS ABS: 0.2 10*3/uL (ref 0.0–0.7)
EOS PCT: 2 %
HCT: 28 % — ABNORMAL LOW (ref 36.0–46.0)
HEMOGLOBIN: 8.8 g/dL — AB (ref 12.0–15.0)
Lymphocytes Relative: 23 %
Lymphs Abs: 2.1 10*3/uL (ref 0.7–4.0)
MCH: 27.3 pg (ref 26.0–34.0)
MCHC: 31.4 g/dL (ref 30.0–36.0)
MCV: 87 fL (ref 78.0–100.0)
MONO ABS: 1.1 10*3/uL — AB (ref 0.1–1.0)
Monocytes Relative: 12 %
Neutro Abs: 5.7 10*3/uL (ref 1.7–7.7)
Neutrophils Relative %: 63 %
Platelets: 296 10*3/uL (ref 150–400)
RBC: 3.22 MIL/uL — AB (ref 3.87–5.11)
RDW: 14.3 % (ref 11.5–15.5)
WBC: 9.2 10*3/uL (ref 4.0–10.5)

## 2016-02-03 LAB — I-STAT CHEM 8, ED
BUN: 74 mg/dL — AB (ref 6–20)
CHLORIDE: 94 mmol/L — AB (ref 101–111)
Calcium, Ion: 1.06 mmol/L — ABNORMAL LOW (ref 1.12–1.23)
Creatinine, Ser: 3.3 mg/dL — ABNORMAL HIGH (ref 0.44–1.00)
Glucose, Bld: 293 mg/dL — ABNORMAL HIGH (ref 65–99)
HEMATOCRIT: 29 % — AB (ref 36.0–46.0)
Hemoglobin: 9.9 g/dL — ABNORMAL LOW (ref 12.0–15.0)
POTASSIUM: 5.7 mmol/L — AB (ref 3.5–5.1)
SODIUM: 133 mmol/L — AB (ref 135–145)
TCO2: 30 mmol/L (ref 0–100)

## 2016-02-03 LAB — I-STAT CG4 LACTIC ACID, ED: LACTIC ACID, VENOUS: 1.65 mmol/L (ref 0.5–2.0)

## 2016-02-03 LAB — I-STAT TROPONIN, ED: Troponin i, poc: 0.03 ng/mL (ref 0.00–0.08)

## 2016-02-03 MED ORDER — INSULIN ASPART 100 UNIT/ML ~~LOC~~ SOLN
5.0000 [IU] | Freq: Once | SUBCUTANEOUS | Status: AC
  Administered 2016-02-03: 5 [IU] via INTRAVENOUS
  Filled 2016-02-03: qty 1

## 2016-02-03 MED ORDER — ATROPINE SULFATE 0.1 MG/ML IJ SOLN
1.0000 mg | Freq: Once | INTRAMUSCULAR | Status: AC
  Administered 2016-02-03: 1 mg via INTRAVENOUS

## 2016-02-03 MED ORDER — SODIUM CHLORIDE 0.9 % IV SOLN
1.0000 g | Freq: Once | INTRAVENOUS | Status: AC
  Administered 2016-02-04: 1 g via INTRAVENOUS
  Filled 2016-02-03: qty 10

## 2016-02-03 MED ORDER — SODIUM CHLORIDE 0.9 % IV BOLUS (SEPSIS)
500.0000 mL | Freq: Once | INTRAVENOUS | Status: AC
  Administered 2016-02-03: 500 mL via INTRAVENOUS

## 2016-02-03 NOTE — ED Notes (Signed)
Pt complaining of dizziness for past three days. Upon EMS arrival, patient with heart rate in the 30's. Pt also with BGL of 377. Pt recently discharged from hospital.

## 2016-02-03 NOTE — ED Notes (Signed)
Message sent to pharmacy about calcium gluconate

## 2016-02-03 NOTE — ED Notes (Signed)
Dr. Billy Fischer notified about pts blood pressure and heart rate.

## 2016-02-03 NOTE — ED Notes (Addendum)
Pt placed on pacing / d fib pads. Zoll at bedside

## 2016-02-03 NOTE — ED Provider Notes (Signed)
CSN: [id number]     Arrival date & time 02/03/16  2255 History  By signing my name below, I, [name], attest that this documentation has been prepared under the direction and in the presence of [name], MD Electronically Signed: Evonnie Dawes, ED Scribe 02/03/2016 at 11:21 PM.   Chief Complaint  Patient presents with  . Bradycardia   The history is provided by the patient. No language interpreter was used.    HPI Comments: [name] is a 55 y.o. female with an extensive pmhx which includes Afib, CHF, cirrhosis, GERD, HTN, HLD, CAD, and diabetes, brought in by ambulance, who presents to the Emergency Department complaining of sudden onset, constant, waxing and waning dizziness, and light headedness along with increasing shakiness which began yesterday. She also reports one episode of emesis today. She denies any similarity of today's sx to those in the past. Sx are exacerbated by sitting and standing.  Pt does not believe that she accidentally took too much of her medication at any time recently. She denies any weakness, diarrhea, CP, abdominal pain, cough, rhinorrhea, or SOB. Pt has had normal appetite today. Pt denies regular alcohol use. Pt has not started any new medications recently. Pt has recently had a temporary pacemaker placed. Pt has a hx of drug abuse, however she is not currently using any narcotics.   Past Medical History  Diagnosis Date  . Alcohol abuse   . Narcotic abuse   . Cocaine abuse   . Marijuana abuse   . Tobacco abuse   . Alcoholic cirrhosis ([place])   . Cardiomyopathy ([place])   . Obesity   . Chronic combined systolic and diastolic CHF (congestive heart failure) ([place])     a. Last echo 12/205: EF 40-45%, inferoapical/posterior HK, not technically sufficient to allow eval of LV diastolic function, normal LA in size..  . Hypothyroidism   . GERD (gastroesophageal reflux disease)   . PAF (paroxysmal atrial fibrillation) ([place])   . Critical lower  limb ischemia   . Hypertension   . CAD (coronary artery disease)     a. cath 11/10/2014 LM nl, LAD min irregs, D1 30 ost, D2 50d, LCX 57m OM1 80 p/m (1.5 mm vessel), OM2 455mRCA nondom 9026mmed rx.. Demand ischemia in the setting of rapid a-fib.  . AMarland Kitchenxiety   . Poorly controlled type 2 diabetes mellitus ([place]CC[place] . Hyperlipemia   . Peripheral arterial disease ([place]   a. 01/2015 Angio/PTA: LSFA 100 w/ recon @ adductor canal and 1 vessel runoff via AT, RSFA 99 (atherectomy/pta) - 1 vessel runoff via diff dzs peroneal.  . Carotid artery disease ([place]   a. 01/2015 Carotid Angio: RICA [id number]/p L carotid endarterectomy 02/2015.  . CMarland KitchenPD (chronic obstructive pulmonary disease) (HCC[place] . Diabetic peripheral neuropathy ([place]CC[place] . Paroxysmal atrial tachycardia ([place]CC[place] . Noncompliance   . Anemia   . B12 deficiency   . Alcoholic cirrhosis (HCCLove . NSVT (nonsustained ventricular tachycardia) ([place]CC[place] . Paroxysmal atrial tachycardia ([place]CC[place] . Hypokalemia   . Hypomagnesemia   . Renal insufficiency     a. 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  . EMarland Kitchenevated troponin     a. Chronic elevation.   Past Surgical History  Procedure Laterality Date  . Cardioversion  ~ 02/2013    "twice"   . Lower extremity angiogram N/A 09/10/2013    Procedure: LOWER EXTREMITY ANGIOGRAM;  Surgeon: JonLorretta HarpD;  Location: [place] CATH LAB;  Service: Cardiovascular;  Laterality: N/A;  . Left heart catheterization with coronary angiogram N/A 10/31/2014    Procedure: LEFT HEART CATHETERIZATION WITH CORONARY ANGIOGRAM;  Surgeon: Burnell Blanks, MD;  Location[facility] C[facility] LAB;  Service: Cardiovascular;  Laterality: N/A;  . Peripheral athrectomy Right 01/15/2015    SFA/notes 01/15/2015  . Balloon angioplasty, artery Right 01/15/2015    SFA/notes 01/15/2015  . Cardiac catheterization    . Lower extremity angiogram N/A 01/15/2015    Procedure: LOWER EXTREMITY ANGIOGRAM;  Surgeon: [name], MD;  Location: [facility] CATH LAB;  Service:  Cardiovascular;  Laterality: N/A;  . Carotid angiogram N/A 01/15/2015    Procedure: CAROTID ANGIOGRAM;  Surgeon: [name], MD;  Location: [facility] CATH LAB;  Service: Cardiovascular;  Laterality: N/A;  . Endarterectomy Left 02/19/2015    Procedure: LEFT CAROTID ENDARTERECTOMY ;  Surgeon: Serafina Mitchell, MD;  Location: [place];  Service: Vascular;  Laterality: Left;  . Dilation and curettage of uterus  1988  . Cardiac catheterization N/A 01/12/2016    Procedure: Temporary Wire;  Surgeon: Minus Breeding, MD;  Location: [place] CV LAB;  Service: Cardiovascular;  Laterality: N/A;   Family History  Problem Relation Age of Onset  . Hypertension Mother   . Diabetes Mother   . Cancer Mother     breast, ovarian, colon  . Clotting disorder Mother   . Heart disease Mother   . Heart attack Mother   . Hypertension Father   . Heart disease Father   . Emphysema Sister     smoked   Social History  Substance Use Topics  . Smoking status: Former Smoker -- 0.00 packs/day for 40 years    Types: E-cigarettes    Quit date: 11/03/2015  . Smokeless tobacco: Former Systems developer  . Alcohol Use: No   OB History    Gravida Para Term Preterm AB TAB SAB Ectopic Multiple Living   _0 Review of Systems  Constitutional: Negative for fever and appetite change.  HENT: Negative for rhinorrhea and sore throat.   Eyes: Negative for visual disturbance.  Respiratory: Negative for cough and shortness of breath.   Cardiovascular: Negative for chest pain.  Gastrointestinal: Positive for vomiting. Negative for abdominal pain and diarrhea.  Genitourinary: Negative for difficulty urinating.  Musculoskeletal: Negative for back pain and neck pain.  Skin: Negative for rash.  Neurological: Positive for dizziness, tremors ("shakiness") and light-headedness. Negative for syncope, weakness and headaches.  All other systems reviewed and are negative.  Allergies  Review of patient's allergies indicates no known  allergies.  Home Medications   Prior to Admission medications   Medication Sig Start Date End Date Taking? Authorizing Provider  albuterol (PROVENTIL HFA;VENTOLIN HFA) 108 (90 BASE) MCG/ACT inhaler Inhale 2 puffs into the lungs every 6 (six) hours as needed for wheezing or shortness of breath. 06/30/15   Arnoldo Morale, MD  albuterol (PROVENTIL) (2.5 MG/3ML) 0.083% nebulizer solution Take 3 mLs (2.5 mg total) by nebulization every 6 (six) hours as needed. For wheezing. 06/30/15   Arnoldo Morale, MD  amiodarone (PACERONE) 100 MG tablet Take 1 tablet (100 mg total) by mouth daily. 01/18/16   Brett Canales, PA-C  atorvastatin (LIPITOR) 40 MG tablet Take 1 tablet (40 mg total) by mouth daily. 06/30/15   Arnoldo Morale, MD  bisoprolol (ZEBETA) 5 MG tablet Take 0.5 tablets (2.5 mg total) by mouth daily. 01/28/16  Larey Dresser, MD  Blood Glucose Monitoring Suppl (ACCU-CHEK AVIVA PLUS) W/DEVICE KIT 1 Device by Does not apply route 4 (four) times daily -  before meals and at bedtime. 07/21/15   Arnoldo Morale, MD  budesonide-formoterol (SYMBICORT) 160-4.5 MCG/ACT inhaler Inhale 2 puffs into the lungs 2 (two) times daily. 12/03/15   Shirley Friar, PA-C  Cholecalciferol (VITAMIN D) 2000 UNITS tablet Take 2,000 Units by mouth daily.    Historical Provider, MD  diltiazem (TIAZAC) 180 MG 24 hr capsule Take 180 mg by mouth daily.    Historical Provider, MD  folic acid (FOLVITE) 1 MG tablet Take 1 tablet (1 mg total) by mouth daily. 11/09/15   Brittainy Erie Noe, PA-C  glipiZIDE (GLUCOTROL XL) 10 MG 24 hr tablet Take 2 tablets (20 mg total) by mouth daily with breakfast. 06/30/15   Arnoldo Morale, MD  insulin aspart (NOVOLOG) 100 UNIT/ML FlexPen Inject 6-14 Units into the skin 3 (three) times daily with meals. Sliding scale 12/03/15   Shirley Friar, PA-C  insulin glargine (LANTUS) 100 unit/mL SOPN Inject 0.7 mLs (70 Units total) into the skin at bedtime. 12/03/15   Shirley Friar, PA-C  Insulin Pen  Needle (ULTICARE MICRO PEN NEEDLES) 32G X 4 MM MISC 1 Syringe by Does not apply route 4 (four) times daily - after meals and at bedtime. 06/30/15   Arnoldo Morale, MD  Lancets (ACCU-CHEK SOFT TOUCH) lancets Use as instructed 07/21/15   Arnoldo Morale, MD  levothyroxine (SYNTHROID, LEVOTHROID) 50 MCG tablet Take 1 tablet (50 mcg total) by mouth daily. 06/30/15   Arnoldo Morale, MD  lisinopril (PRINIVIL,ZESTRIL) 2.5 MG tablet Take 1 tablet (2.5 mg total) by mouth daily. 01/18/16   Brett Canales, PA-C  nitroGLYCERIN (NITROSTAT) 0.4 MG SL tablet Place 1 tablet (0.4 mg total) under the tongue every 5 (five) minutes as needed for chest pain. 01/16/15   Rogelia Mire, NP  NON FORMULARY Place 2 L into the nose continuous.    Historical Provider, MD  pantoprazole (PROTONIX) 40 MG tablet Take 1 tablet (40 mg total) by mouth daily. 09/22/15 09/21/16  Arnoldo Morale, MD  potassium chloride SA (K-DUR,KLOR-CON) 20 MEQ tablet Take 1 tablet (20 mEq total) by mouth 2 (two) times daily. 01/28/16   Larey Dresser, MD  rivaroxaban (XARELTO) 20 MG TABS tablet Take 1 tablet (20 mg total) by mouth daily with supper. 06/30/15   Arnoldo Morale, MD  tiotropium (SPIRIVA) 18 MCG inhalation capsule Place 1 capsule (18 mcg total) into inhaler and inhale daily. 12/03/15   Shirley Friar, PA-C  torsemide (DEMADEX) 20 MG tablet Take 60 mg (3 tabs) in am and 40 mg (2 tabs) in pm 01/12/16   Larey Dresser, MD  vitamin B-12 1000 MCG tablet Take 1 tablet (1,000 mcg total) by mouth daily. 11/10/15   Brittainy M Simmons, PA-C   BP 117/44 mmHg  Pulse 36  Temp(Src) 97.8 F (36.6 C) (Oral)  Resp 13  Ht _0  (1.626 m)  Wt 238 lb (107.956 kg)  BMI 40.83 kg/m2  SpO2 100%  LMP 11/27/2012 Physical Exam  Constitutional: She is oriented to person, place, and time. She appears well-developed and well-nourished. No distress.  HENT:  Head: Normocephalic and atraumatic.  Symmetric tongue and palate elevation.   Eyes: Conjunctivae and EOM are  normal.  Neck: Normal range of motion.  Cardiovascular: Regular rhythm, normal heart sounds and intact distal pulses.  Bradycardia present.  Exam reveals no gallop and no  friction rub.   No murmur heard. 2+ b/l pulses. Bradycardic.  Pulmonary/Chest: Effort normal and breath sounds normal. No respiratory distress. She has no wheezes. She has no rales.  Lungs clear b/l.  Abdominal: Soft. She exhibits no distension. There is no tenderness. There is no guarding.  Musculoskeletal: She exhibits no edema or tenderness.  Neurological: She is alert and oriented to person, place, and time. She has normal strength. She displays tremor (bilateral hand flapping consistent with asterixis). No cranial nerve deficit or sensory deficit. GCS eye subscore is 4. GCS verbal subscore is 5. GCS motor subscore is 6.  Skin: Skin is warm and dry. No rash noted. She is not diaphoretic. No erythema.  Scattered skin wounds that are currently healing. No signs of cellulitis. Pitting, scattered edema.   Nursing note and vitals reviewed.   ED Course  Procedures  DIAGNOSTIC STUDIES: Oxygen Saturation is 100% on RA, normal by my interpretation.    COORDINATION OF CARE: 11:04 PM-Discussed treatment plan which includes DG Chest, blood work, troponin, and an EKG, with pt at bedside and pt agreed to plan.   Labs Review Labs Reviewed  CBC WITH DIFFERENTIAL/PLATELET - Abnormal; Notable for the following:    RBC 3.22 (*)    Hemoglobin 8.8 (*)    HCT 28.0 (*)    Monocytes Absolute 1.1 (*)    All other components within normal limits  I-STAT CHEM 8, ED - Abnormal; Notable for the following:    Sodium 133 (*)    Potassium 5.7 (*)    Chloride 94 (*)    BUN 74 (*)    Creatinine, Ser 3.30 (*)    Glucose, Bld 293 (*)    Calcium, Ion 1.06 (*)    Hemoglobin 9.9 (*)    HCT 29.0 (*)    All other components within normal limits  COMPREHENSIVE METABOLIC PANEL  URINE RAPID DRUG SCREEN, HOSP PERFORMED  AMMONIA  I-STAT  TROPOININ, ED  I-STAT CG4 LACTIC ACID, ED    Imaging Review Dg Chest Portable 1 View  02/03/2016  CLINICAL DATA:  Bradycardia tonight. Shortness of breath. Dizziness and weakness. EXAM: PORTABLE CHEST 1 VIEW COMPARISON:  01/12/2016 FINDINGS: Shallow inspiration with atelectasis in the lung bases. Mild cardiac enlargement. No pulmonary vascular congestion. No focal airspace disease or consolidation. No blunting of costophrenic angles. No pneumothorax. Calcification of the aorta. IMPRESSION: Shallow inspiration with atelectasis in the lung bases. Cardiac enlargement. Electronically Signed   By: Lucienne Capers M.D.   On: 02/03/2016 23:32   I have personally reviewed and evaluated these images and lab results as part of my medical decision-making.   EKG Interpretation   Date/Time:  Wednesday February 03 2016 22:56:14 EDT Ventricular Rate:  36 PR Interval:    QRS Duration: 114 QT Interval:  600 QTC Calculation: 464 R Axis:   16 Text Interpretation:  Junctional bradycardia Borderline intraventricular  conduction delay Low voltage, precordial leads Baseline wander in lead(s)  V6 Since previous tracing, paced rhythm is no longer present Confirmed by  [facility] MD, Terryn Redner ([id number]) on 02/03/2016 11:50:10 PM     Medications  calcium gluconate 1 g in sodium chloride 0.9 % 100 mL IVPB (not administered)  atropine 0.1 MG/ML injection 1 mg (1 mg Intravenous Given 02/03/16 2321)  atropine 0.1 MG/ML injection 1 mg (1 mg Intravenous Given 02/03/16 2339)  insulin aspart (novoLOG) injection 5 Units (5 Units Intravenous Given 02/03/16 2347)  sodium chloride 0.9 % bolus 500 mL (500 mLs Intravenous New Bag/Given  02/03/16 2340)    MDM   Final diagnoses:  Bradycardia  Symptomatic bradycardia  Acute kidney injury (nontraumatic) ([name])  Hyperkalemia   55 year old female with a past history of polysubstance abuse and alcohol abuse, alcoholic cirrhosis, cardiomyopathy with combined systolic and diastolic CHF,  hypothyroidism, paroxysmal atrial fibrillation, hypertension, coronary artery disease, COPD, diabetes, recent admission for a junctional bradycardia with hypotension with a trans-penis temporary pacemaker placed presents with concern for lightheadedness. Patient did not have permanent pacemaker placed, and was continued on diltiazem, amiodarone and continues to take brimolol. Patient is in a junctional bradycardia on arrival to the emergency department with a rate in the 30s, and initially blood pressures within normal limits. Patient's systolic blood pressure trended to 90 systolic, and she was given 1 mg of atropine, with only temporarily improvement in her heart rate and blood pressures. Potassium is mildly elevated at 5.7, within new acute kidney injury at creatinine of 3.3. Patient given calcium, insulin (hyperglycemic also to 293.)  AKI and lightheadedness likely secondary to bradycardia/hypoperfusion.  Patient also concerned with shakiness, and exam shows asterixis.  Ammonia sent although patient without other signs of hepatic encephalopathy.   Primary issue is patient's bradycardia with hypotension.  BP stable now in 90s, patient mentating well, and will not transcutaneously pace at this time. Spoke with Dr. Philbert Riser from Cardiology and will admit patient to CCU for continued care.   I personally performed the services described in this documentation, which was scribed in my presence. The recorded information has been reviewed and is accurate.     [name], MD 02/04/16 1432

## 2016-02-03 NOTE — ED Notes (Signed)
Dr. Schlossman at bedside. 

## 2016-02-03 NOTE — ED Notes (Signed)
Pt states that when she left the hospital her heart rate "had straightened out or they would have put in a pacemaker"

## 2016-02-04 ENCOUNTER — Inpatient Hospital Stay (HOSPITAL_COMMUNITY): Payer: Medicare Other

## 2016-02-04 DIAGNOSIS — R001 Bradycardia, unspecified: Secondary | ICD-10-CM

## 2016-02-04 DIAGNOSIS — I480 Paroxysmal atrial fibrillation: Secondary | ICD-10-CM

## 2016-02-04 DIAGNOSIS — E875 Hyperkalemia: Secondary | ICD-10-CM

## 2016-02-04 DIAGNOSIS — I481 Persistent atrial fibrillation: Secondary | ICD-10-CM

## 2016-02-04 DIAGNOSIS — I519 Heart disease, unspecified: Secondary | ICD-10-CM

## 2016-02-04 LAB — BASIC METABOLIC PANEL
Anion gap: 12 (ref 5–15)
Anion gap: 12 (ref 5–15)
BUN: 71 mg/dL — AB (ref 6–20)
BUN: 83 mg/dL — AB (ref 6–20)
CALCIUM: 8.8 mg/dL — AB (ref 8.9–10.3)
CO2: 26 mmol/L (ref 22–32)
CO2: 26 mmol/L (ref 22–32)
CREATININE: 3.6 mg/dL — AB (ref 0.44–1.00)
CREATININE: 4.21 mg/dL — AB (ref 0.44–1.00)
Calcium: 8.8 mg/dL — ABNORMAL LOW (ref 8.9–10.3)
Chloride: 96 mmol/L — ABNORMAL LOW (ref 101–111)
Chloride: 99 mmol/L — ABNORMAL LOW (ref 101–111)
GFR calc Af Amer: 16 mL/min — ABNORMAL LOW (ref >60)
GFR calc Af Amer: 13 mL/min — ABNORMAL LOW (ref >60)
GFR calc non Af Amer: 13 mL/min — ABNORMAL LOW (ref >60)
GFR, EST NON AFRICAN AMERICAN: 11 mL/min — AB (ref >60)
GLUCOSE: 325 mg/dL — AB (ref 65–99)
Glucose, Bld: 161 mg/dL — ABNORMAL HIGH (ref 65–99)
POTASSIUM: 4.9 mmol/L (ref 3.5–5.1)
Potassium: 6 mmol/L — ABNORMAL HIGH (ref 3.5–5.1)
SODIUM: 137 mmol/L (ref 135–145)
Sodium: 134 mmol/L — ABNORMAL LOW (ref 135–145)

## 2016-02-04 LAB — COMPREHENSIVE METABOLIC PANEL
ALT: 44 U/L (ref 14–54)
AST: 27 U/L (ref 15–41)
Albumin: 3 g/dL — ABNORMAL LOW (ref 3.5–5.0)
Alkaline Phosphatase: 146 U/L — ABNORMAL HIGH (ref 38–126)
Anion gap: 11 (ref 5–15)
BUN: 67 mg/dL — AB (ref 6–20)
CHLORIDE: 94 mmol/L — AB (ref 101–111)
CO2: 28 mmol/L (ref 22–32)
Calcium: 8.6 mg/dL — ABNORMAL LOW (ref 8.9–10.3)
Creatinine, Ser: 3.66 mg/dL — ABNORMAL HIGH (ref 0.44–1.00)
GFR calc Af Amer: 15 mL/min — ABNORMAL LOW (ref >60)
GFR, EST NON AFRICAN AMERICAN: 13 mL/min — AB (ref >60)
Glucose, Bld: 300 mg/dL — ABNORMAL HIGH (ref 65–99)
POTASSIUM: 5.7 mmol/L — AB (ref 3.5–5.1)
SODIUM: 133 mmol/L — AB (ref 135–145)
Total Bilirubin: 0.3 mg/dL (ref 0.3–1.2)
Total Protein: 6.8 g/dL (ref 6.5–8.1)

## 2016-02-04 LAB — TROPONIN I
TROPONIN I: 0.03 ng/mL (ref <0.031)
Troponin I: 0.04 ng/mL — ABNORMAL HIGH (ref <0.031)
Troponin I: 0.03 ng/mL (ref <0.031)
Troponin I: 0.03 ng/mL (ref <0.031)

## 2016-02-04 LAB — GLUCOSE, CAPILLARY
GLUCOSE-CAPILLARY: 284 mg/dL — AB (ref 65–99)
GLUCOSE-CAPILLARY: 352 mg/dL — AB (ref 65–99)
GLUCOSE-CAPILLARY: 288 mg/dL — AB (ref 65–99)
GLUCOSE-CAPILLARY: 458 mg/dL — AB (ref 65–99)
Glucose-Capillary: 154 mg/dL — ABNORMAL HIGH (ref 65–99)
Glucose-Capillary: 471 mg/dL — ABNORMAL HIGH (ref 65–99)

## 2016-02-04 LAB — IRON AND TIBC
Iron: 34 ug/dL (ref 28–170)
Saturation Ratios: 10 % — ABNORMAL LOW (ref 10.4–31.8)
TIBC: 357 ug/dL (ref 250–450)
UIBC: 323 ug/dL

## 2016-02-04 LAB — CBC
HCT: 26.7 % — ABNORMAL LOW (ref 36.0–46.0)
Hemoglobin: 8.2 g/dL — ABNORMAL LOW (ref 12.0–15.0)
MCH: 26.5 pg (ref 26.0–34.0)
MCHC: 30.7 g/dL (ref 30.0–36.0)
MCV: 86.4 fL (ref 78.0–100.0)
PLATELETS: 269 10*3/uL (ref 150–400)
RBC: 3.09 MIL/uL — ABNORMAL LOW (ref 3.87–5.11)
RDW: 14.2 % (ref 11.5–15.5)
WBC: 10.7 10*3/uL — ABNORMAL HIGH (ref 4.0–10.5)

## 2016-02-04 LAB — LACTIC ACID, PLASMA
Lactic Acid, Venous: 1.4 mmol/L (ref 0.5–2.0)
Lactic Acid, Venous: 1 mmol/L (ref 0.5–2.0)

## 2016-02-04 LAB — AMMONIA: AMMONIA: 33 umol/L (ref 9–35)

## 2016-02-04 LAB — FERRITIN: FERRITIN: 45 ng/mL (ref 11–307)

## 2016-02-04 LAB — MRSA PCR SCREENING: MRSA BY PCR: NEGATIVE

## 2016-02-04 LAB — BRAIN NATRIURETIC PEPTIDE: B NATRIURETIC PEPTIDE 5: 355.3 pg/mL — AB (ref 0.0–100.0)

## 2016-02-04 MED ORDER — FOLIC ACID 1 MG PO TABS
1.0000 mg | ORAL_TABLET | Freq: Every day | ORAL | Status: DC
Start: 2016-02-04 — End: 2016-02-08
  Administered 2016-02-04 – 2016-02-08 (×5): 1 mg via ORAL
  Filled 2016-02-04 (×5): qty 1

## 2016-02-04 MED ORDER — SODIUM CHLORIDE 0.9 % IV SOLN
INTRAVENOUS | Status: DC
  Administered 2016-02-05: 50 mL/h via INTRAVENOUS

## 2016-02-04 MED ORDER — PANTOPRAZOLE SODIUM 40 MG PO TBEC
40.0000 mg | DELAYED_RELEASE_TABLET | Freq: Every day | ORAL | Status: DC
  Administered 2016-02-04 – 2016-02-08 (×5): 40 mg via ORAL
  Filled 2016-02-04 (×5): qty 1

## 2016-02-04 MED ORDER — INSULIN ASPART 100 UNIT/ML ~~LOC~~ SOLN
0.0000 [IU] | Freq: Three times a day (TID) | SUBCUTANEOUS | Status: DC
  Administered 2016-02-04: 8 [IU] via SUBCUTANEOUS
  Administered 2016-02-04: 3 [IU] via SUBCUTANEOUS
  Administered 2016-02-04: 15 [IU] via SUBCUTANEOUS
  Administered 2016-02-05 (×3): 5 [IU] via SUBCUTANEOUS
  Administered 2016-02-06: 8 [IU] via SUBCUTANEOUS
  Administered 2016-02-06: 5 [IU] via SUBCUTANEOUS
  Administered 2016-02-07: 3 [IU] via SUBCUTANEOUS

## 2016-02-04 MED ORDER — ATORVASTATIN CALCIUM 40 MG PO TABS
40.0000 mg | ORAL_TABLET | Freq: Every day | ORAL | Status: DC
  Administered 2016-02-05 – 2016-02-08 (×4): 40 mg via ORAL
  Filled 2016-02-04 (×4): qty 1

## 2016-02-04 MED ORDER — INSULIN GLARGINE 100 UNIT/ML ~~LOC~~ SOLN
70.0000 [IU] | Freq: Every day | SUBCUTANEOUS | Status: DC
  Administered 2016-02-04 – 2016-02-07 (×5): 70 [IU] via SUBCUTANEOUS
  Filled 2016-02-04 (×8): qty 0.7

## 2016-02-04 MED ORDER — INSULIN GLARGINE 100 UNITS/ML SOLOSTAR PEN
70.0000 [IU] | PEN_INJECTOR | Freq: Every day | SUBCUTANEOUS | Status: DC
Start: 2016-02-04 — End: 2016-02-04
  Filled 2016-02-04: qty 3

## 2016-02-04 MED ORDER — INSULIN ASPART 100 UNIT/ML FLEXPEN: 6.0000 [IU] | PEN_INJECTOR | Freq: Three times a day (TID) | SUBCUTANEOUS | Status: DC

## 2016-02-04 MED ORDER — SODIUM POLYSTYRENE SULFONATE 15 GM/60ML PO SUSP
45.0000 g | Freq: Once | ORAL | Status: AC
  Administered 2016-02-04: 45 g via ORAL
  Filled 2016-02-04: qty 180

## 2016-02-04 MED ORDER — LEVOTHYROXINE SODIUM 50 MCG PO TABS
50.0000 ug | ORAL_TABLET | Freq: Every day | ORAL | Status: DC
  Administered 2016-02-04 – 2016-02-08 (×5): 50 ug via ORAL
  Filled 2016-02-04 (×5): qty 1

## 2016-02-04 MED ORDER — ACETAMINOPHEN 325 MG PO TABS
650.0000 mg | ORAL_TABLET | Freq: Four times a day (QID) | ORAL | Status: DC | PRN
  Administered 2016-02-05 – 2016-02-08 (×9): 650 mg via ORAL
  Filled 2016-02-04 (×9): qty 2

## 2016-02-04 MED ORDER — TRAMADOL HCL 50 MG PO TABS
50.0000 mg | ORAL_TABLET | Freq: Four times a day (QID) | ORAL | Status: DC | PRN
  Administered 2016-02-04 – 2016-02-07 (×8): 50 mg via ORAL
  Filled 2016-02-04 (×8): qty 1

## 2016-02-04 MED ORDER — TIOTROPIUM BROMIDE MONOHYDRATE 18 MCG IN CAPS
18.0000 ug | ORAL_CAPSULE | Freq: Every day | RESPIRATORY_TRACT | Status: DC
  Administered 2016-02-04 – 2016-02-08 (×5): 18 ug via RESPIRATORY_TRACT
  Filled 2016-02-04: qty 5

## 2016-02-04 MED ORDER — VITAMIN B-12 1000 MCG PO TABS
1000.0000 ug | ORAL_TABLET | Freq: Every day | ORAL | Status: DC
  Administered 2016-02-04 – 2016-02-08 (×5): 1000 ug via ORAL
  Filled 2016-02-04 (×5): qty 1

## 2016-02-04 MED ORDER — ALBUTEROL SULFATE (2.5 MG/3ML) 0.083% IN NEBU: 2.5000 mg | INHALATION_SOLUTION | Freq: Four times a day (QID) | RESPIRATORY_TRACT | Status: DC | PRN

## 2016-02-04 MED ORDER — MOMETASONE FURO-FORMOTEROL FUM 200-5 MCG/ACT IN AERO
2.0000 | INHALATION_SPRAY | Freq: Two times a day (BID) | RESPIRATORY_TRACT | Status: DC
  Administered 2016-02-04 – 2016-02-08 (×9): 2 via RESPIRATORY_TRACT
  Filled 2016-02-04 (×2): qty 8.8

## 2016-02-04 MED ORDER — SODIUM CHLORIDE 0.9% FLUSH
3.0000 mL | Freq: Two times a day (BID) | INTRAVENOUS | Status: DC
  Administered 2016-02-04 – 2016-02-05 (×4): 3 mL via INTRAVENOUS

## 2016-02-04 NOTE — Consult Note (Signed)
Reason for Consult: Acute renal failure on chronic kidney disease stage III Referring Physician: Alphia Moh M.D. (Cardiology)   HPI:  55 year old Caucasian woman past medical history significant for paroxysmal atrial fibrillation, combined chronic CHF, hypertension, type 2 diabetes mellitus, dyslipidemia, peripheral vascular disease status post multiple prior interventions including CEA, cirrhosis, prior history of alcohol/polysubstance abuse and history of chronic kidney disease stage III (baseline creatinine 1.1-1.3) who was recently discharged from the hospital after an admission for symptomatic bradycardia treated with temporary transvenous pacemaker.  She was readmitted early this morning with dizziness/lightheadedness/tremors and weakness and was likely taking the wrong regime of medications. She was again found to be bradycardic and unresponsive to atropine in the ER and consequently admitted. She was found to have acute on chronic renal failure with a creatinine of 3.6 and a potassium of 5.7 (was taking lisinopril and potassium prior to admission). She reported one episode of vomiting earlier yesterday and denies any diarrhea. She denies any recent iodinated intravenous contrast and has not had any newly emerging skin rash/epistaxis. She denies any flank pain, fever or chills.  She denies any personal history of renal disease, recurrent urinary tract infections or history of kidney stones. She does not have any strong family history of renal disease or autoimmune disorders.   Past Medical History  Diagnosis Date  . Alcohol abuse   . Narcotic abuse   . Cocaine abuse   . Marijuana abuse   . Tobacco abuse   . Alcoholic cirrhosis ([place])   . Cardiomyopathy ([place])   . Obesity   . Chronic combined systolic and diastolic CHF (congestive heart failure) ([place])     a. Last echo 12/205: EF 40-45%, inferoapical/posterior HK, not technically sufficient to allow eval of LV diastolic function, normal  LA in size..  . Hypothyroidism   . GERD (gastroesophageal reflux disease)   . PAF (paroxysmal atrial fibrillation) ([place])   . Critical lower limb ischemia   . Hypertension   . CAD (coronary artery disease)     a. cath 11/10/2014 LM nl, LAD min irregs, D1 30 ost, D2 50d, LCX 30m, OM1 80 p/m (1.5 mm vessel), OM2 78m, RCA nondom 63m-->med rx.. Demand ischemia in the setting of rapid a-fib.  [name] Anxiety   . Poorly controlled type 2 diabetes mellitus ([place])   . Hyperlipemia   . Peripheral arterial disease ([place])     a. 01/2015 Angio/PTA: LSFA 100 w/ recon @ adductor canal and 1 vessel runoff via AT, RSFA 99 (atherectomy/pta) - 1 vessel runoff via diff dzs peroneal.  . Carotid artery disease ([place])     a. 01/2015 Carotid Angio: RICA [id number], LICA [id number]. s/p L carotid endarterectomy 02/2015.  [name] COPD (chronic obstructive pulmonary disease) ([place])   . Diabetic peripheral neuropathy ([place])   . Paroxysmal atrial tachycardia ([place])   . Noncompliance   . Anemia   . B12 deficiency   . Alcoholic cirrhosis ([place])   . NSVT (nonsustained ventricular tachycardia) ([place])   . Paroxysmal atrial tachycardia ([place])   . Hypokalemia   . Hypomagnesemia   . Renal insufficiency     a. 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  [name] Elevated troponin     a. Chronic elevation.    Past Surgical History  Procedure Laterality Date  . Cardioversion  ~ 02/2013    "twice"   . Lower extremity angiogram N/A 09/10/2013    Procedure: LOWER EXTREMITY ANGIOGRAM;  Surgeon: [name], MD;  Location: [facility] CATH LAB;  Service: Cardiovascular;  Laterality: N/A;  .  Left heart catheterization with coronary angiogram N/A 10/31/2014    Procedure: LEFT HEART CATHETERIZATION WITH CORONARY ANGIOGRAM;  Surgeon: Burnell Blanks, MD;  Location: [facility] CATH LAB;  Service: Cardiovascular;  Laterality: N/A;  . Peripheral athrectomy Right 01/15/2015    SFA/notes 01/15/2015  . Balloon angioplasty, artery Right 01/15/2015    SFA/notes 01/15/2015  . Cardiac catheterization    .  Lower extremity angiogram N/A 01/15/2015    Procedure: LOWER EXTREMITY ANGIOGRAM;  Surgeon: [name], MD;  Location: [facility] CATH LAB;  Service: Cardiovascular;  Laterality: N/A;  . Carotid angiogram N/A 01/15/2015    Procedure: CAROTID ANGIOGRAM;  Surgeon: [name], MD;  Location: [facility] CATH LAB;  Service: Cardiovascular;  Laterality: N/A;  . Endarterectomy Left 02/19/2015    Procedure: LEFT CAROTID ENDARTERECTOMY ;  Surgeon: Serafina Mitchell, MD;  Location: [name];  Service: Vascular;  Laterality: Left;  . Dilation and curettage of uterus  1988  . Cardiac catheterization N/A 01/12/2016    Procedure: Temporary Wire;  Surgeon: Minus Breeding, MD;  Location: [place] CV LAB;  Service: Cardiovascular;  Laterality: N/A;    Family History  Problem Relation Age of Onset  . Hypertension Mother   . Diabetes Mother   . Cancer Mother     breast, ovarian, colon  . Clotting disorder Mother   . Heart disease Mother   . Heart attack Mother   . Hypertension Father   . Heart disease Father   . Emphysema Sister     smoked    Social History:  reports that she quit smoking about 3 months ago. Her smoking use included E-cigarettes. She smoked 0.00 packs per day for 40 years. She has quit using smokeless tobacco. She reports that she does not drink alcohol or use illicit drugs.  Allergies: No Known Allergies  Medications:  Scheduled: . atorvastatin  40 mg Oral q1800  . folic acid  1 mg Oral Daily  . insulin aspart  0-15 Units Subcutaneous TID WC  . insulin glargine  70 Units Subcutaneous QHS  . levothyroxine  50 mcg Oral QAC breakfast  . mometasone-formoterol  2 puff Inhalation BID  . pantoprazole  40 mg Oral Daily  . sodium chloride flush  3 mL Intravenous Q12H  . tiotropium  18 mcg Inhalation Daily  . vitamin B-12  1,000 mcg Oral Daily    BMP Latest Ref Rng 02/04/2016 02/03/2016 02/03/2016  Glucose 65 - 99 mg/dL 325(H) 293(H) 300(H)  BUN 6 - 20 mg/dL 71(H) 74(H) 67(H)  Creatinine 0.44 -  1.00 mg/dL 3.60(H) 3.30(H) 3.66(H)  Sodium 135 - 145 mmol/L 134(L) 133(L) 133(L)  Potassium 3.5 - 5.1 mmol/L 6.0(H) 5.7(H) 5.7(H)  Chloride 101 - 111 mmol/L 96(L) 94(L) 94(L)  CO2 22 - 32 mmol/L 26 - 28  Calcium 8.9 - 10.3 mg/dL 8.8(L) - 8.6(L)   CBC Latest Ref Rng 02/04/2016 02/03/2016 02/03/2016  WBC 4.0 - 10.5 K/uL 10.7(H) - 9.2  Hemoglobin 12.0 - 15.0 g/dL 8.2(L) 9.9(L) 8.8(L)  Hematocrit 36.0 - 46.0 % 26.7(L) 29.0(L) 28.0(L)  Platelets 150 - 400 K/uL 269 - 296      Dg Chest Portable 1 View  02/03/2016  CLINICAL DATA:  Bradycardia tonight. Shortness of breath. Dizziness and weakness. EXAM: PORTABLE CHEST 1 VIEW COMPARISON:  01/12/2016 FINDINGS: Shallow inspiration with atelectasis in the lung bases. Mild cardiac enlargement. No pulmonary vascular congestion. No focal airspace disease or consolidation. No blunting of costophrenic angles. No pneumothorax. Calcification of the aorta. IMPRESSION: Shallow  inspiration with atelectasis in the lung bases. Cardiac enlargement. Electronically Signed   By: Lucienne Capers M.D.   On: 02/03/2016 23:32    Review of Systems  Constitutional: Positive for malaise/fatigue and diaphoresis. Negative for fever and chills.  HENT: Negative.   Eyes: Negative.   Respiratory: Negative.   Cardiovascular: Positive for palpitations and leg swelling. Negative for chest pain and orthopnea.  Gastrointestinal: Negative.   Genitourinary: Negative.   Musculoskeletal: Negative.   Skin: Negative.   Neurological: Positive for dizziness, tremors and weakness.   Blood pressure 100/48, pulse 33, temperature 98.4 F (36.9 C), temperature source Oral, resp. rate 16, height 5\' 4"  (1.626 m), weight 110.6 kg (243 lb 13.3 oz), last menstrual period 11/27/2012, SpO2 99 %. Physical Exam  Nursing note and vitals reviewed. Constitutional: She is oriented to person, place, and time. She appears well-developed and well-nourished. No distress.  HENT:  Head: Normocephalic and  atraumatic.  Eyes: EOM are normal. Pupils are equal, round, and reactive to light.  Neck: Normal range of motion. Neck supple. No JVD present.  Cardiovascular: Regular rhythm.   No murmur heard. Bradycardia  Respiratory: Effort normal and breath sounds normal. She has no wheezes. She has no rales.  GI: Soft. Bowel sounds are normal. There is no tenderness. There is no rebound and no guarding.  Musculoskeletal: She exhibits edema.  1-2+ LE edema  Neurological: She is alert and oriented to person, place, and time.  Skin: Skin is warm and dry. No rash noted. No erythema.    Assessment/Plan: 1. Acute renal failure on chronic kidney disease stage III: Appears to have been hemodynamically mediated with symptomatic bradycardia in the face of diuretic therapy/ongoing ACE inhibitor (and possibly inadvertent overmedication by the patient). I will send her for urinalysis, urine electrolytes and gently hydrate her. Renal ultrasound has been requested in order to evaluate for any obstruction. Hold diuretics/ACE inhibitor/potassium for now. Will continue to monitor closely as we avoid nephrotoxins including iodinated intravenous contrast and minimize hypotension. I suspect definitive management of her bradycardia is likely to result in more sustained and stable renal function.  2. Hyperkalemia: Secondary to acute renal failure with ongoing ACE inhibitor/potassium supplement-unclear whether the patient was taking additional potassium tablets. Also likely associated with her concomitant hyperglycemia. Treat with Kayexalate and recheck labs again this evening. 3. Anemia: Denies any overt loss, check iron studies and decide on repletion. She is up-to-date with colonoscopy and reports with had 4 polyps removed last year. 4. Symptomatic Bradycardia:  appears to be partly from pharmacological reasons-patient taking oral medications. Currently off of AVN blocking agents and ongoing telemetry monitoring. Further  management per cardiology.  Maryjean Corpening K. 02/04/2016, 11:55 AM

## 2016-02-04 NOTE — Progress Notes (Signed)
Inpatient Diabetes Program Recommendations  AACE/ADA: New Consensus Statement on Inpatient Glycemic Control (2015)  Target Ranges:  Prepandial:   less than 140 mg/dL      Peak postprandial:   less than 180 mg/dL (1-2 hours)      Critically ill patients:  140 - 180 mg/dL   Review of Glycemic Control  Diabetes history: DM2 Outpatient Diabetes medications: Lantus 70 units QHS, Novolog 6-14 units tidwc, glipizide 20 mg QD Current orders for Inpatient glycemic control: Lantus 70 units QHS, Novolog moderate Q4H  Inpatient Diabetes Program Recommendations:    Need updated HgbA1C to assess glycemic control prior to hospitalization. May need more Lantus d/t FBS > elevated.   Follow.  Thank you. Lorenda Peck, RD, LDN, CDE Inpatient Diabetes Coordinator [phone]

## 2016-02-04 NOTE — Progress Notes (Signed)
[facility]  Patient Status: Active (receiving services up to time of hospitalization)  [facility] is providing the following services: RN and MSW  If patient discharges after hours, please call [phone].   [name] 02/04/2016, 11:00 AM

## 2016-02-04 NOTE — Care Management Note (Signed)
Case Management Note  Patient Details  Name: [name] MRN: [id number] Date of Birth: 12-14-1960  Subjective/Objective:       Adm w brady             Action/Plan: lives w wife, chart states act w [facility]   Expected Discharge Date:                  Expected Discharge Plan:  [place]  In-House Referral:     Discharge planning Services     Post Acute Care Choice:  Resumption of Svcs/PTA Provider Choice offered to:     DME Arranged:    DME Agency:     HH Arranged:  RN [place] Agency:  [place]  Status of Service:     Medicare Important Message Given:    Date Medicare IM Given:    Medicare IM give by:    Date Additional Medicare IM Given:    Additional Medicare Important Message give by:     If discussed at [place] of Stay Meetings, dates discussed:    Additional Comments: ur reivew done  Lacretia Leigh, RN 02/04/2016, 7:37 AM

## 2016-02-04 NOTE — H&P (Signed)
HPI: [name] is a 55 y.o. female with an extensive pmhx which includes pAF, combined chronic CHF, cirrhosis, GERD, HTN, HLD, CAD, PAD and diabetes, brought in by ambulance, who presents to the ED complaining of sudden onset, constant, waxing and waning dizziness, and light headedness along with increasing shakiness which began yesterday. She reports some similar symptoms in the the past but cannot tell me what the etiology was at the time. Sx are exacerbated by sitting and standing. Pt states that she felt a sudden onset of dizziness earlier today that caused her to drop everything in her hands. She tells me she is taking her medications but is unable to tell me what they are, however, she has all of them in her purse.  Of note, she has amiodarone, bisoprolol and diltiazem.  Reviewing the chart she was discharged earlier this month following an admission for bradycardia with transvenous temp pacer placement with instructions to continue amiodarone and bisoprolol.  However, upon cardiology f/u there is reference to medications not matching med list and that she remained on amiodarone and CCB.  Today, I asked her specifically about these and she appears to be taking all 3.  In ED her vitals showed bradycardia with HR 36 confirmed by ECG.  Atropine given in ER without significant improvement.  Labs significant for upper normal lactate 1.65 but pre-renal AKI with BUN/Cr 67/3.6, mild hyperkalemia 5.7, HCO3 28, Glu 300, stable Hg 8.8 and negative troponin.   Review of Systems:     Cardiac Review of Systems: {Y] = yes '[ ]'  = no  Chest Pain [    ]  Resting SOB [   ] Exertional SOB  [  ]  Orthopnea [  ]   Pedal Edema [   ]    Palpitations [  ] Syncope  [  ]   Presyncope [   ]  General Review of Systems: [Y] = yes [  ]=no Constitional: recent weight change [  ]; anorexia [  ]; fatigue [  ]; nausea [  ]; night sweats [  ]; fever [  ]; or chills [  ];                                                                       Dental: poor dentition[  ];   Eye : blurred vision [  ]; diplopia [   ]; vision changes [  ];  Amaurosis fugax[  ]; Resp: cough [  ];  wheezing[  ];  hemoptysis[  ]; shortness of breath[  ]; paroxysmal nocturnal dyspnea[  ]; dyspnea on exertion[  ]; or orthopnea[  ];  GI:  gallstones[  ], vomiting[ X ];  dysphagia[  ]; melena[  ];  hematochezia [  ]; heartburn[  ];   GU: kidney stones [  ]; hematuria[  ];   dysuria [  ];  nocturia[  ];               Skin: rash [  ], swelling[  ];, hair loss[  ];  peripheral edema[  ];  or itching[  ]; Musculosketetal: myalgias[  ];  joint swelling[  ];  joint erythema[  ];  joint pain[  ];  back pain[  ];  Heme/Lymph: bruising[  ];  bleeding[  ];  anemia[  ];  Neuro: TIA[  ];  headaches[  ];  stroke[  ];  vertigo[  ];  seizures[  ];   paresthesias[  ];  difficulty walking[  ];  Psych:depression[  ]; anxiety[  ];  Endocrine: diabetes[  ];  thyroid dysfunction[  ];  Other:  Past Medical History  Diagnosis Date  . Alcohol abuse   . Narcotic abuse   . Cocaine abuse   . Marijuana abuse   . Tobacco abuse   . Alcoholic cirrhosis ([place])   . Cardiomyopathy ([place])   . Obesity   . Chronic combined systolic and diastolic CHF (congestive heart failure) ([place])     a. Last echo 12/205: EF 40-45%, inferoapical/posterior HK, not technically sufficient to allow eval of LV diastolic function, normal LA in size..  . Hypothyroidism   . GERD (gastroesophageal reflux disease)   . PAF (paroxysmal atrial fibrillation) ([place])   . Critical lower limb ischemia   . Hypertension   . CAD (coronary artery disease)     a. cath 11/10/2014 LM nl, LAD min irregs, D1 30 ost, D2 50d, LCX 14m OM1 80 p/m (1.5 mm vessel), OM2 410mRCA nondom 9066mmed rx.. Demand ischemia in the setting of rapid a-fib.  . AMarland Kitchenxiety   . Poorly controlled type 2 diabetes mellitus ([facility]CC[facility] . Hyperlipemia   . Peripheral arterial disease ([facility]   a. 01/2015 Angio/PTA: LSFA 100 w/ recon @ adductor  [place] and 1 vessel runoff via AT, RSFA 99 (atherectomy/pta) - 1 vessel runoff via diff dzs peroneal.  . Carotid artery disease ([facility]   a. 01/2015 Carotid Angio: RICA [id number] L carotid endarterectomy 02/2015.  . CMarland KitchenPD (chronic obstructive pulmonary disease) ([facility]CC[facility] [place] . Diabetic peripheral neuropathy ([facility] . Paroxysmal atrial tachycardia (HCC[facility] . Noncompliance   . Anemia   . B12 deficiency   . Alcoholic cirrhosis ([facility] . NSVT (nonsustained ventricular tachycardia) ([facility]CC[facility] . Paroxysmal atrial tachycardia ([facility]CC[place] . Hypokalemia   . Hypomagnesemia   . Renal insufficiency     a. 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  . EMarland Kitchenevated troponin     a. Chronic elevation.    No current facility-administered medications on file prior to encounter.   Current Outpatient Prescriptions on File Prior to Encounter  Medication Sig Dispense Refill  . albuterol (PROVENTIL HFA;VENTOLIN HFA) 108 (90 BASE) MCG/ACT inhaler Inhale 2 puffs into the lungs every 6 (six) hours as needed for wheezing or shortness of breath. 1 each 3  . albuterol (PROVENTIL) (2.5 MG/3ML) 0.083% nebulizer solution Take 3 mLs (2.5 mg total) by nebulization every 6 (six) hours as needed. For wheezing. 75 mL 2  . amiodarone (PACERONE) 100 MG tablet Take 1 tablet (100 mg total) by mouth daily. 30 tablet 11  . atorvastatin (LIPITOR) 40 MG tablet Take 1 tablet (40 mg total) by mouth daily. 30 tablet 2  . bisoprolol (ZEBETA) 5 MG tablet Take 0.5 tablets (2.5 mg total) by mouth daily. 15 tablet 3  . budesonide-formoterol (SYMBICORT) 160-4.5 MCG/ACT inhaler Inhale 2 puffs into the lungs 2 (two) times daily. 1 Inhaler 12  . Cholecalciferol (VITAMIN D) 2000 UNITS tablet Take 2,000 Units by mouth daily.    . dMarland Kitchenltiazem (TIAZAC) 180 MG 24 hr capsule Take 180 mg by mouth daily.    . folic acid (FOLVITE) 1 MG tablet Take  1 tablet (1 mg total) by mouth daily. 30 tablet 5  . glipiZIDE (GLUCOTROL XL) 10 MG 24 hr tablet Take 2 tablets (20 mg total) by mouth  daily with breakfast. 60 tablet 3  . insulin aspart (NOVOLOG) 100 UNIT/ML FlexPen Inject 6-14 Units into the skin 3 (three) times daily with meals. Sliding scale 15 mL 11  . insulin glargine (LANTUS) 100 unit/mL SOPN Inject 0.7 mLs (70 Units total) into the skin at bedtime. 15 mL 11  . levothyroxine (SYNTHROID, LEVOTHROID) 50 MCG tablet Take 1 tablet (50 mcg total) by mouth daily. 30 tablet 2  . lisinopril (PRINIVIL,ZESTRIL) 2.5 MG tablet Take 1 tablet (2.5 mg total) by mouth daily. 30 tablet 11  . nitroGLYCERIN (NITROSTAT) 0.4 MG SL tablet Place 1 tablet (0.4 mg total) under the tongue every 5 (five) minutes as needed for chest pain. 25 tablet 3  . pantoprazole (PROTONIX) 40 MG tablet Take 1 tablet (40 mg total) by mouth daily. 30 tablet 2  . potassium chloride SA (K-DUR,KLOR-CON) 20 MEQ tablet Take 1 tablet (20 mEq total) by mouth 2 (two) times daily. 60 tablet 3  . rivaroxaban (XARELTO) 20 MG TABS tablet Take 1 tablet (20 mg total) by mouth daily with supper. 30 tablet 2  . tiotropium (SPIRIVA) 18 MCG inhalation capsule Place 1 capsule (18 mcg total) into inhaler and inhale daily. 30 capsule 12  . torsemide (DEMADEX) 20 MG tablet Take 60 mg (3 tabs) in am and 40 mg (2 tabs) in pm (Patient taking differently: Take 40-60 mg by mouth See admin instructions. Take 60 mg (3 tabs) in am and 40 mg (2 tabs) in pm) 180 tablet 6  . vitamin B-12 1000 MCG tablet Take 1 tablet (1,000 mcg total) by mouth daily. 30 tablet 5  . Blood Glucose Monitoring Suppl (ACCU-CHEK AVIVA PLUS) W/DEVICE KIT 1 Device by Does not apply route 4 (four) times daily -  before meals and at bedtime. 1 kit 0  . Insulin Pen Needle (ULTICARE MICRO PEN NEEDLES) 32G X 4 MM MISC 1 Syringe by Does not apply route 4 (four) times daily - after meals and at bedtime. 1 each 11  . Lancets (ACCU-CHEK SOFT TOUCH) lancets Use as instructed 100 each 12  . NON FORMULARY Place 2 L into the nose continuous.       No Known Allergies  Social History     Social History  . Marital Status: Divorced    Spouse Name: N/A  . Number of Children: N/A  . Years of Education: N/A   Occupational History  . disabled    Social History Main Topics  . Smoking status: Former Smoker -- 0.00 packs/day for 40 years    Types: E-cigarettes    Quit date: 11/03/2015  . Smokeless tobacco: Former Systems developer  . Alcohol Use: No  . Drug Use: No     Comment: 04/29/2015 "last drug use was ~ 09/08/2013"  . Sexual Activity: No   Other Topics Concern  . Not on file   Social History Narrative   Lives in [place], in [place] with sister.  They are looking to move but don't have a place to go yet.      Family History  Problem Relation Age of Onset  . Hypertension Mother   . Diabetes Mother   . Cancer Mother     breast, ovarian, colon  . Clotting disorder Mother   . Heart disease Mother   . Heart attack Mother   . Hypertension  Father   . Heart disease Father   . Emphysema Sister     smoked    PHYSICAL EXAM: Filed Vitals:   02/04/16 0020 02/04/16 0030  BP: 92/72 103/50  Pulse: 41 42  Temp:    Resp: 11 15   General:  Chronically ill appearing. Older appearing than stated age. No respiratory difficulty HEENT: AT, [place], EOMI, anicteric, dry MM Neck: supple. Elevated JVP and + HJR. Carotids 2+ bilat; no bruits. No lymphadenopathy or thryomegaly appreciated. Cor: PMI nondisplaced. Bradycardic rate & rhythm. No rubs, gallops or murmurs. Lungs: Distant but clear Abdomen: soft, nontender, nondistended. No hepatosplenomegaly. No bruits or masses. Good bowel sounds. Extremities: no cyanosis, clubbing, rash, edema Neuro: alert & oriented x 3, cranial nerves grossly intact. moves all 4 extremities w/o difficulty. Affect pleasant.  ECG: Regular, narrow complex bradycardia.  Appears to possibly have small p waves prior to each of the QRS complexes in the V1 rhythm strip but cannot say with certainty, and if so has a short PR interval.   Results for orders placed or  performed during the hospital encounter of 02/03/16 (from the past 24 hour(s))  CBC with Differential     Status: Abnormal   Collection Time: 02/03/16 10:46 PM  Result Value Ref Range   WBC 9.2 4.0 - 10.5 K/uL   RBC 3.22 (L) 3.87 - 5.11 MIL/uL   Hemoglobin 8.8 (L) 12.0 - 15.0 g/dL   HCT 28.0 (L) 36.0 - 46.0 %   MCV 87.0 78.0 - 100.0 fL   MCH 27.3 26.0 - 34.0 pg   MCHC 31.4 30.0 - 36.0 g/dL   RDW 14.3 11.5 - 15.5 %   Platelets 296 150 - 400 K/uL   Neutrophils Relative % 63 %   Neutro Abs 5.7 1.7 - 7.7 K/uL   Lymphocytes Relative 23 %   Lymphs Abs 2.1 0.7 - 4.0 K/uL   Monocytes Relative 12 %   Monocytes Absolute 1.1 (H) 0.1 - 1.0 K/uL   Eosinophils Relative 2 %   Eosinophils Absolute 0.2 0.0 - 0.7 K/uL   Basophils Relative 0 %   Basophils Absolute 0.0 0.0 - 0.1 K/uL  Comprehensive metabolic panel     Status: Abnormal   Collection Time: 02/03/16 10:46 PM  Result Value Ref Range   Sodium 133 (L) 135 - 145 mmol/L   Potassium 5.7 (H) 3.5 - 5.1 mmol/L   Chloride 94 (L) 101 - 111 mmol/L   CO2 28 22 - 32 mmol/L   Glucose, Bld 300 (H) 65 - 99 mg/dL   BUN 67 (H) 6 - 20 mg/dL   Creatinine, Ser 3.66 (H) 0.44 - 1.00 mg/dL   Calcium 8.6 (L) 8.9 - 10.3 mg/dL   Total Protein 6.8 6.5 - 8.1 g/dL   Albumin 3.0 (L) 3.5 - 5.0 g/dL   AST 27 15 - 41 U/L   ALT 44 14 - 54 U/L   Alkaline Phosphatase 146 (H) 38 - 126 U/L   Total Bilirubin 0.3 0.3 - 1.2 mg/dL   GFR calc non Af Amer 13 (L) >60 mL/min   GFR calc Af Amer 15 (L) >60 mL/min   Anion gap 11 5 - 15  Ammonia     Status: None   Collection Time: 02/03/16 10:46 PM  Result Value Ref Range   Ammonia 33 9 - 35 umol/L  I-Stat Troponin, ED - 0, 3, 6 hours (not at [facility])     Status: None   Collection Time: 02/03/16 11:21  PM  Result Value Ref Range   Troponin i, poc 0.03 0.00 - 0.08 ng/mL   Comment 3          I-Stat Chem 8, ED     Status: Abnormal   Collection Time: 02/03/16 11:23 PM  Result Value Ref Range   Sodium 133 (L) 135 - 145 mmol/L    Potassium 5.7 (H) 3.5 - 5.1 mmol/L   Chloride 94 (L) 101 - 111 mmol/L   BUN 74 (H) 6 - 20 mg/dL   Creatinine, Ser 3.30 (H) 0.44 - 1.00 mg/dL   Glucose, Bld 293 (H) 65 - 99 mg/dL   Calcium, Ion 1.06 (L) 1.12 - 1.23 mmol/L   TCO2 30 0 - 100 mmol/L   Hemoglobin 9.9 (L) 12.0 - 15.0 g/dL   HCT 29.0 (L) 36.0 - 46.0 %  I-Stat CG4 Lactic Acid, ED     Status: None   Collection Time: 02/03/16 11:23 PM  Result Value Ref Range   Lactic Acid, Venous 1.65 0.5 - 2.0 mmol/L   Dg Chest Portable 1 View  02/03/2016  CLINICAL DATA:  Bradycardia tonight. Shortness of breath. Dizziness and weakness. EXAM: PORTABLE CHEST 1 VIEW COMPARISON:  01/12/2016 FINDINGS: Shallow inspiration with atelectasis in the lung bases. Mild cardiac enlargement. No pulmonary vascular congestion. No focal airspace disease or consolidation. No blunting of costophrenic angles. No pneumothorax. Calcification of the aorta. IMPRESSION: Shallow inspiration with atelectasis in the lung bases. Cardiac enlargement. Electronically Signed   By: Lucienne Capers M.D.   On: 02/03/2016 23:32     ASSESSMENT/PLAN: 1. Symptomatic bradycardia: likely due to multiple AVN blocking agents as mentioned above.  - hold AVN blocking agents (amio, dilt and bisoprolol) - monitor overnight as expect improvement with holding doses, temp pacer if needed depending on HR, BP and symptoms - prn atropine - pacing pads on in case transcutaneous pacing needed - discussed plan with Dr. Claiborne Billings  2. Paroxysmal AF:  CHA2DS2Vasc 5 - hold AVN blocking agents as per above bradycardia - hold Xarelto due to AKI with GFR < 15  3. AKI:  Appears pre-renal on labs (BUN/Cr ratio of ~ 20), likely ATN component.  Assume multifactorial as she is bradycardic with likely decrease CO/renal perfusion, on torsemide though appears volume up by JVP, and ACE-I - hold diuretic and ACE-I - monitor closely, hope for improvement with increase in HR  4. Hyperkalemia: due to above AKI and K  repletion - received tx in ED, monitor - hold KCL, ACE-I  5. DM/hyperglycemia - continue lantus + meal coverage/SSI  6. Chronic diastolic CHF:  EF 15% on 0/56/97 - as noted above - will likely need diuresis once HR improves

## 2016-02-04 NOTE — Consult Note (Signed)
ELECTROPHYSIOLOGY CONSULT NOTE    Patient ID: METTE SOUTHGATE MRN: [id number], DOB/AGE: Mar 19, 1961 55 y.o.  Admit date: 02/03/2016 Date of Consult: 02/04/2016  Primary Physician: Sandi Mariscal, MD Primary Cardiologist: Dr. Gwenlyn Found CHF: Dr. Aundra Dubin  Reason for Consultation: symptomatic bradycardia  HPI: [name] is a 55 y.o. female who was recently at [facility] with symptomatic bradycardia requiring temp pacing with home meds of amiodarone, bisoprolol and diltiazem, her medicines were held and her HR improved and the patient after Dr. Rayann Heman discussed with Dr. Aundra Dubin who was familiar with her felt avoiding PPM implant was the better approach and was discharged 01/18/16 on amiodarone, bisoprolol , her diltiazem stopped.  She was seen by L. Roxy Horseman 01/25/16 for TOC visit and note mentions "She has brought a huge bag of medicine bottles. Her list DOES NOT match up with the discharge summary. She is NOT on Zebeta."  It appears she was taking amiodarone 121m daily and diltiazem CD 180 daily as of that visit.  There is a telephone note dated 01/28/16  that mentions the patient reported she was in fact taking bisoprolol 2.514mdaily and was advised to continue to take her medicines. So appears that she was therfor back on all 3 medicines and her list of medicines coming in this admit does list all 3 medicines.  All of which have been held  Medication compliance has been mentioned as an issue/concern for this patient historically as well.  She arrived to the ED 02/04/16 with recurrent dizzy spells no syncope is reported. Noted to be bradycardic with rates 30's  Her labs notable for ARF with BUN/Creat 71/3.60 today (discharged at 27/1.09) K+ 5.7, 5.7, and 6.0 this morning H/H 8.2/26.7 appears similar to previous Trop 0.04, 0.03, 0.03 TSH on 01/12/16 with her last admit wnl, 4.112  BP has been stable   The patient feels "fine" this morning, she denies feeling lightheaded or dizzy this morning, in bed, no CP or  SOB.  She tells me she has been feeling weak and "shakey" when up and around, no near syncope or syncope, no CP no SOB. She does tell me there was confusion on which medicines she was to be taking and was taking them all.   PMHX: She has PMHx of CAD medically treated after her cath Dec 2015, noting diffuse branch vessel disease and EF 30-35%, chronic CHF with an improved EF to 45% in Jan 2017, she has PVD with L CEA April 2016, LE PAD with hx of atherectomy of SFA in 2014, reports of claudication, O2 dependent COPD, HTN, hypothyroidism, HLD, PAFib on xarelto. Her record mentions history of ETOH related cirrhois, and ETOH Narcotic, cocaine abuse, though no longer using (4 years) or drinking (4 years), she is an ongoing heavy cigarrette smoker until Jan 2017, CRI, ?hx of VT. Jan 2017 she was hospitalized with acute respiratory failure and intibated with AF RVR treated with amio gtt and diuresis, with ARI on CRI and her ARB held.    Past Medical History  Diagnosis Date  . Alcohol abuse   . Narcotic abuse   . Cocaine abuse   . Marijuana abuse   . Tobacco abuse   . Alcoholic cirrhosis (HCSeeley  . Cardiomyopathy (HCPortland  . Obesity   . Chronic combined systolic and diastolic CHF (congestive heart failure) (HCOrleans    a. Last echo 12/205: EF 40-45%, inferoapical/posterior HK, not technically sufficient to allow eval of LV diastolic function, normal LA in size.. [name].[name]  Hypothyroidism   . GERD (gastroesophageal reflux disease)   . PAF (paroxysmal atrial fibrillation) ([place])   . Critical lower limb ischemia   . Hypertension   . CAD (coronary artery disease)     a. cath 11/10/2014 LM nl, LAD min irregs, D1 30 ost, D2 50d, LCX 63m OM1 80 p/m (1.5 mm vessel), OM2 45mRCA nondom 9016mmed rx.. Demand ischemia in the setting of rapid a-fib.  . AMarland Kitchenxiety   . Poorly controlled type 2 diabetes mellitus (HCCSugar Grove . Hyperlipemia   . Peripheral arterial disease (HCCFort Lee   a. 01/2015 Angio/PTA: LSFA 100 w/ recon @  adductor canal and 1 vessel runoff via AT, RSFA 99 (atherectomy/pta) - 1 vessel runoff via diff dzs peroneal.  . Carotid artery disease (HCCArtas   a. 01/2015 Carotid Angio: RICA [id number]/p L carotid endarterectomy 02/2015.  . CMarland KitchenPD (chronic obstructive pulmonary disease) (HCCEagle . Diabetic peripheral neuropathy (HCCWest Millgrove . Paroxysmal atrial tachycardia (HCCWeb[place] . Noncompliance   . Anemia   . B12 deficiency   . Alcoholic cirrhosis (HCCGifford . NSVT (nonsustained ventricular tachycardia) (HCCBoone . Paroxysmal atrial tachycardia (HCCTalpa . Hypokalemia   . Hypomagnesemia   . Renal insufficiency     a. 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  . EMarland Kitchenevated troponin     a. Chronic elevation.     Surgical History:  Past Surgical History  Procedure Laterality Date  . Cardioversion  ~ 02/2013    "twice"   . Lower extremity angiogram N/A 09/10/2013    Procedure: LOWER EXTREMITY ANGIOGRAM;  Surgeon: JonLorretta HarpD;  [facility];  Service: Cardiovascular;  Laterality: N/A;  . Left heart catheterization with coronary angiogram N/A 10/31/2014    Procedure: LEFT HEART CATHETERIZATION WITH CORONARY ANGIOGRAM;  Surgeon: ChrBurnell BlanksD;  [facility];  Service: Cardiovascular;  Laterality: N/A;  . Peripheral athrectomy Right 01/15/2015    SFA/notes 01/15/2015  . Balloon angioplasty, artery Right 01/15/2015    SFA/notes 01/15/2015  . Cardiac catheterization    . Lower extremity angiogram N/A 01/15/2015    Procedure: LOWER EXTREMITY ANGIOGRAM;  Surgeon: JonLorretta HarpD;  [facility];  Service: Cardiovascular;  Laterality: N/A;  . Carotid angiogram N/A 01/15/2015    Procedure: CAROTID ANGIOGRAM;  Surgeon: JonLorretta HarpD;  [facility];  Service: Cardiovascular;  Laterality: N/A;  . Endarterectomy Left 02/19/2015    Procedure: LEFT CAROTID ENDARTERECTOMY ;  Surgeon: VanSerafina MitchellD;  [facility];  Laterality: Left;  . Dilation and curettage of  uterus  1988  . Cardiac catheterization N/A 01/12/2016    Procedure: Temporary Wire;  Surgeon: JamMinus BreedingD;  [facility] [place] [facility];  Service: Cardiovascular;  Laterality: N/A;     Prescriptions prior to admission  Medication Sig Dispense Refill Last Dose  . albuterol (PROVENTIL HFA;VENTOLIN HFA) 108 (90 BASE) MCG/ACT inhaler Inhale 2 puffs into the lungs every 6 (six) hours as needed for wheezing or shortness of breath. 1 each 3 02/03/2016 at Unknown time  . albuterol (PROVENTIL) (2.5 MG/3ML) 0.083% nebulizer solution Take 3 mLs (2.5 mg total) by nebulization every 6 (six) hours as needed. For wheezing. 75 mL 2 02/03/2016 at Unknown time  . amiodarone (PACERONE) 100 MG tablet Take 1 tablet (100 mg total) by mouth daily. 30 tablet 11 02/03/2016 at Unknown time  . atorvastatin (LIPITOR)  40 MG tablet Take 1 tablet (40 mg total) by mouth daily. 30 tablet 2 02/03/2016 at Unknown time  . bisoprolol (ZEBETA) 5 MG tablet Take 0.5 tablets (2.5 mg total) by mouth daily. 15 tablet 3 02/03/2016 at Unknown time  . budesonide-formoterol (SYMBICORT) 160-4.5 MCG/ACT inhaler Inhale 2 puffs into the lungs 2 (two) times daily. 1 Inhaler 12 02/03/2016 at Unknown time  . Cholecalciferol (VITAMIN D) 2000 UNITS tablet Take 2,000 Units by mouth daily.   02/03/2016 at Unknown time  . diltiazem (TIAZAC) 180 MG 24 hr capsule Take 180 mg by mouth daily.   02/03/2016 at Unknown time  . folic acid (FOLVITE) 1 MG tablet Take 1 tablet (1 mg total) by mouth daily. 30 tablet 5 02/03/2016 at Unknown time  . glipiZIDE (GLUCOTROL XL) 10 MG 24 hr tablet Take 2 tablets (20 mg total) by mouth daily with breakfast. 60 tablet 3 02/03/2016 at Unknown time  . insulin aspart (NOVOLOG) 100 UNIT/ML FlexPen Inject 6-14 Units into the skin 3 (three) times daily with meals. Sliding scale 15 mL 11 02/03/2016 at Unknown time  . insulin glargine (LANTUS) 100 unit/mL SOPN Inject 0.7 mLs (70 Units total) into the skin at bedtime. 15 mL 11 02/02/2016  at Unknown time  . levothyroxine (SYNTHROID, LEVOTHROID) 50 MCG tablet Take 1 tablet (50 mcg total) by mouth daily. 30 tablet 2 02/03/2016 at Unknown time  . lisinopril (PRINIVIL,ZESTRIL) 2.5 MG tablet Take 1 tablet (2.5 mg total) by mouth daily. 30 tablet 11 02/03/2016 at Unknown time  . nitroGLYCERIN (NITROSTAT) 0.4 MG SL tablet Place 1 tablet (0.4 mg total) under the tongue every 5 (five) minutes as needed for chest pain. 25 tablet 3 unknown  . pantoprazole (PROTONIX) 40 MG tablet Take 1 tablet (40 mg total) by mouth daily. 30 tablet 2 02/03/2016 at Unknown time  . potassium chloride SA (K-DUR,KLOR-CON) 20 MEQ tablet Take 1 tablet (20 mEq total) by mouth 2 (two) times daily. 60 tablet 3 02/03/2016 at Unknown time  . rivaroxaban (XARELTO) 20 MG TABS tablet Take 1 tablet (20 mg total) by mouth daily with supper. 30 tablet 2 02/02/2016 at 2100  . tiotropium (SPIRIVA) 18 MCG inhalation capsule Place 1 capsule (18 mcg total) into inhaler and inhale daily. 30 capsule 12 02/03/2016 at Unknown time  . torsemide (DEMADEX) 20 MG tablet Take 60 mg (3 tabs) in am and 40 mg (2 tabs) in pm (Patient taking differently: Take 40-60 mg by mouth See admin instructions. Take 60 mg (3 tabs) in am and 40 mg (2 tabs) in pm) 180 tablet 6 02/03/2016 at Unknown time  . vitamin B-12 1000 MCG tablet Take 1 tablet (1,000 mcg total) by mouth daily. 30 tablet 5 02/03/2016 at Unknown time  . Blood Glucose Monitoring Suppl (ACCU-CHEK AVIVA PLUS) W/DEVICE KIT 1 Device by Does not apply route 4 (four) times daily -  before meals and at bedtime. 1 kit 0 Taking  . Insulin Pen Needle (ULTICARE MICRO PEN NEEDLES) 32G X 4 MM MISC 1 Syringe by Does not apply route 4 (four) times daily - after meals and at bedtime. 1 each 11 Taking  . Lancets (ACCU-CHEK SOFT TOUCH) lancets Use as instructed 100 each 12 Taking  . NON FORMULARY Place 2 L into the nose continuous.   Taking    Inpatient Medications:  . atorvastatin  40 mg Oral q1800  . folic acid   1 mg Oral Daily  . insulin aspart  0-15 Units Subcutaneous TID WC  .  insulin glargine  70 Units Subcutaneous QHS  . levothyroxine  50 mcg Oral QAC breakfast  . mometasone-formoterol  2 puff Inhalation BID  . pantoprazole  40 mg Oral Daily  . sodium chloride flush  3 mL Intravenous Q12H  . tiotropium  18 mcg Inhalation Daily  . vitamin B-12  1,000 mcg Oral Daily    Allergies: No Known Allergies  Social History   Social History  . Marital Status: Divorced    Spouse Name: N/A  . Number of Children: N/A  . Years of Education: N/A   Occupational History  . disabled    Social History Main Topics  . Smoking status: Former Smoker -- 0.00 packs/day for 40 years    Types: E-cigarettes    Quit date: 11/03/2015  . Smokeless tobacco: Former Systems developer  . Alcohol Use: No  . Drug Use: No     Comment: 04/29/2015 "last drug use was ~ 09/08/2013"  . Sexual Activity: No   Other Topics Concern  . Not on file   Social History Narrative   Lives in [place], in [place] with sister.  They are looking to move but don't have a place to go yet.       Family History  Problem Relation Age of Onset  . Hypertension Mother   . Diabetes Mother   . Cancer Mother     breast, ovarian, colon  . Clotting disorder Mother   . Heart disease Mother   . Heart attack Mother   . Hypertension Father   . Heart disease Father   . Emphysema Sister     smoked     Review of Systems All other systems reviewed and are otherwise negative except as noted above.  Physical Exam: Filed Vitals:   02/04/16 0500 02/04/16 0704 02/04/16 0800 02/04/16 0900  BP: 112/48  102/61 112/43  Pulse: 40 37 36 37  Temp:   99 F (37.2 C)   TempSrc:   Oral   Resp: '17 13 17 17  ' Height:      Weight:      SpO2: 99% 99% 98% 99%    GEN- The patient is sleeping, easily woken and mentating well, appears in NAD, alert and oriented x 3 today.   HEENT: normocephalic, atraumatic; sclera clear, conjunctiva pink; hearing intact; oropharynx  clear; neck supple, no JVP Lymph- no cervical lymphadenopathy Lungs- Clear to ausculation bilaterally, normal work of breathing.  No wheezes, rales, rhonchi Heart- Regular rate and rhythm, bradycardic, no  Significant murmurs, rubs or gallops, PMI not laterally displaced GI- soft, non-tender, non-distended Extremities- no clubbing, cyanosis, trace if any edema, dressing RLE MS- no significant deformity or atrophy Skin- warm and dry, no rash or lesion Psych- euthymic mood, full affect Neuro- no gross deficits observed  Labs:   Lab Results  Component Value Date   WBC 10.7* 02/04/2016   HGB 8.2* 02/04/2016   HCT 26.7* 02/04/2016   MCV 86.4 02/04/2016   PLT 269 02/04/2016    Recent Labs Lab 02/03/16 2246  02/04/16 0211  NA 133*  < > 134*  K 5.7*  < > 6.0*  CL 94*  < > 96*  CO2 28  --  26  BUN 67*  < > 71*  CREATININE 3.66*  < > 3.60*  CALCIUM 8.6*  --  8.8*  PROT 6.8  --   --   BILITOT 0.3  --   --   ALKPHOS 146*  --   --   ALT 44  --   --  AST 27  --   --   GLUCOSE 300*  < > 325*  < > = values in this interval not displayed.    Radiology/Studies:  Dg Chest Portable 1 View 02/03/2016  CLINICAL DATA:  Bradycardia tonight. Shortness of breath. Dizziness and weakness. EXAM: PORTABLE CHEST 1 VIEW COMPARISON:  01/12/2016 FINDINGS: Shallow inspiration with atelectasis in the lung bases. Mild cardiac enlargement. No pulmonary vascular congestion. No focal airspace disease or consolidation. No blunting of costophrenic angles. No pneumothorax. Calcification of the aorta. IMPRESSION: Shallow inspiration with atelectasis in the lung bases. Cardiac enlargement. Electronically Signed   By: Lucienne Capers M.D.   On: 02/03/2016 23:32      EKG: junctional bradycrdia, 36bpm TELEMETRY: junctional rhythm, rates 30's-40's, some sinus 50's  11/24/15: Echocardiogram Study Conclusions - Left ventricle: The cavity size was normal. Wall thickness was increased in a pattern of mild LVH.  The estimated ejection fraction was 45%. Inferolateral hypokinesis. Features are consistent with a pseudonormal left ventricular filling pattern, with concomitant abnormal relaxation and increased filling pressure (grade 2 diastolic dysfunction). E/medial e&' > 15 suggests LV end diastolic pressure at least 20 mmHg. - Aortic valve: There was no stenosis. - Mitral valve: Mildly calcified annulus. There was mild regurgitation. - Left atrium: The atrium was mildly dilated. (65m) - Right ventricle: The cavity size was normal. Systolic function was mildly reduced. - Right atrium: The atrium was mildly dilated. - Pulmonary arteries: No complete TR doppler jet so unable to estimate PA systolic pressure. - Systemic veins: IVC measured 2.3 cm with < 50% respirophasic variation, suggesting RA pressure 15 mmHg. Impressions: - Normal LV size with mild LV hypertrophy. EF 45% with inferolateral severe hypokinesis. Moderate diastolic dysfunction with evidence for elevated LV filling pressure. Normal RV size with mildly decreased systolic function. Mild MR.   Assessment and Plan:   1. Symptomatic bradycardia     Junctional bradycardia on arrival 36bpm     Back on amiodarone,bisoprolol and diltiazem at home unfortunately, all have been held here     HR 30's-50's, BP stable, currently when awake and talking to me 35-37bpm     Agree with holding rate contolling meds, follow HR closely     No PPM at this time, would like to avoid temp wire     zoll pads are on the patient  2. ARF, hyperkalemia     We have asked nephrology to the case     ACE and diuretic have been held  3. Hx of PAF     CHA2DS2Vasc is at least 4     On Xarelto out patient has been held secondary to ARF  4. HTN     BP stable  5. CHF     Exam does not appear fluid OL at this time     CXR without edema  6. Anemia     Appears similar to her last admission    Signed, RTommye Standard  PA-C 02/04/2016 9:45 AM   I have seen, examined the patient, and reviewed the above assessment and plan.  Changes to above are made where necessary.  On exam, chronically ill.  Bradycardic.  Known to me from recent hospitalization. Unfortunately it appears that she continues to inadvertently overmedicate. I would not advise pacing presently but would allow dilt, bisoprolol, and amioadrone to washout. Restart amiodarone along at 1054mdaily over the next 1-2 days as heart rate improves. No indication for pacing if her HR improves. I think that she may  require social work assistance to see if she qualifies for any resources for medicine administration.  She does not seem to understand how to safely administer her medications.  General cardiology to manage and EP to see when needed  Co Sign: Thompson Grayer, MD 02/04/2016 4:43 PM

## 2016-02-05 DIAGNOSIS — I5042 Chronic combined systolic (congestive) and diastolic (congestive) heart failure: Secondary | ICD-10-CM

## 2016-02-05 DIAGNOSIS — N179 Acute kidney failure, unspecified: Secondary | ICD-10-CM

## 2016-02-05 LAB — RENAL FUNCTION PANEL
ALBUMIN: 2.9 g/dL — AB (ref 3.5–5.0)
Anion gap: 11 (ref 5–15)
BUN: 86 mg/dL — AB (ref 6–20)
CALCIUM: 8.3 mg/dL — AB (ref 8.9–10.3)
CO2: 25 mmol/L (ref 22–32)
CREATININE: 3.78 mg/dL — AB (ref 0.44–1.00)
Chloride: 96 mmol/L — ABNORMAL LOW (ref 101–111)
GFR calc Af Amer: 15 mL/min — ABNORMAL LOW (ref >60)
GFR, EST NON AFRICAN AMERICAN: 13 mL/min — AB (ref >60)
GLUCOSE: 341 mg/dL — AB (ref 65–99)
PHOSPHORUS: 6.5 mg/dL — AB (ref 2.5–4.6)
POTASSIUM: 4.9 mmol/L (ref 3.5–5.1)
SODIUM: 132 mmol/L — AB (ref 135–145)

## 2016-02-05 LAB — RAPID URINE DRUG SCREEN, HOSP PERFORMED
AMPHETAMINES: NOT DETECTED
BENZODIAZEPINES: NOT DETECTED
Barbiturates: NOT DETECTED
Cocaine: NOT DETECTED
OPIATES: POSITIVE — AB
Tetrahydrocannabinol: NOT DETECTED

## 2016-02-05 LAB — PROTIME-INR
INR: 1.42 (ref 0.00–1.49)
PROTHROMBIN TIME: 17.4 s — AB (ref 11.6–15.2)

## 2016-02-05 LAB — GLUCOSE, CAPILLARY
GLUCOSE-CAPILLARY: 421 mg/dL — AB (ref 65–99)
GLUCOSE-CAPILLARY: 224 mg/dL — AB (ref 65–99)
GLUCOSE-CAPILLARY: 218 mg/dL — AB (ref 65–99)
GLUCOSE-CAPILLARY: 222 mg/dL — AB (ref 65–99)
Glucose-Capillary: 202 mg/dL — ABNORMAL HIGH (ref 65–99)

## 2016-02-05 LAB — CREATININE, URINE, RANDOM: CREATININE, URINE: 127.17 mg/dL

## 2016-02-05 LAB — URINALYSIS, ROUTINE W REFLEX MICROSCOPIC
BILIRUBIN URINE: NEGATIVE
GLUCOSE, UA: NEGATIVE mg/dL
HGB URINE DIPSTICK: NEGATIVE
KETONES UR: NEGATIVE mg/dL
Nitrite: NEGATIVE
PROTEIN: NEGATIVE mg/dL
Specific Gravity, Urine: 1.016 (ref 1.005–1.030)
pH: 5 (ref 5.0–8.0)

## 2016-02-05 LAB — HEPARIN LEVEL (UNFRACTIONATED): HEPARIN UNFRACTIONATED: 0.79 [IU]/mL — AB (ref 0.30–0.70)

## 2016-02-05 LAB — URINE MICROSCOPIC-ADD ON

## 2016-02-05 LAB — SODIUM, URINE, RANDOM: SODIUM UR: 17 mmol/L

## 2016-02-05 LAB — APTT
aPTT: 30 seconds (ref 24–37)
aPTT: <20 seconds — ABNORMAL LOW (ref 24–37)

## 2016-02-05 MED ORDER — PROMETHAZINE HCL 25 MG/ML IJ SOLN
25.0000 mg | Freq: Once | INTRAMUSCULAR | Status: AC
Start: 2016-02-05 — End: 2016-02-05
  Administered 2016-02-05: 25 mg via INTRAVENOUS
  Filled 2016-02-05: qty 1

## 2016-02-05 MED ORDER — INSULIN ASPART 100 UNIT/ML ~~LOC~~ SOLN
15.0000 [IU] | Freq: Once | SUBCUTANEOUS | Status: AC
  Administered 2016-02-05: 15 [IU] via SUBCUTANEOUS

## 2016-02-05 MED ORDER — ONDANSETRON HCL 4 MG/2ML IJ SOLN
4.0000 mg | Freq: Four times a day (QID) | INTRAMUSCULAR | Status: DC | PRN
  Administered 2016-02-05 (×2): 4 mg via INTRAVENOUS
  Filled 2016-02-05: qty 2

## 2016-02-05 MED ORDER — SODIUM CHLORIDE 0.9 % IV SOLN
510.0000 mg | INTRAVENOUS | Status: DC
  Administered 2016-02-05: 510 mg via INTRAVENOUS
  Filled 2016-02-05: qty 17

## 2016-02-05 MED ORDER — AMLODIPINE BESYLATE 5 MG PO TABS
5.0000 mg | ORAL_TABLET | Freq: Every day | ORAL | Status: DC
  Administered 2016-02-05 – 2016-02-08 (×4): 5 mg via ORAL
  Filled 2016-02-05 (×4): qty 1

## 2016-02-05 MED ORDER — HYDRALAZINE HCL 20 MG/ML IJ SOLN
10.0000 mg | INTRAMUSCULAR | Status: DC | PRN
  Administered 2016-02-05: 10 mg via INTRAVENOUS
  Filled 2016-02-05: qty 1

## 2016-02-05 MED ORDER — HEPARIN (PORCINE) IN NACL 100-0.45 UNIT/ML-% IJ SOLN
1750.0000 [IU]/h | INTRAMUSCULAR | Status: AC
  Administered 2016-02-05: 1000 [IU]/h via INTRAVENOUS
  Administered 2016-02-06: 1800 [IU]/h via INTRAVENOUS
  Administered 2016-02-06: 1500 [IU]/h via INTRAVENOUS
  Administered 2016-02-07 – 2016-02-08 (×2): 1800 [IU]/h via INTRAVENOUS
  Filled 2016-02-05 (×5): qty 250

## 2016-02-05 MED ORDER — INSULIN ASPART 100 UNIT/ML ~~LOC~~ SOLN
10.0000 [IU] | Freq: Once | SUBCUTANEOUS | Status: AC
  Administered 2016-02-05: 10 [IU] via SUBCUTANEOUS

## 2016-02-05 MED ORDER — ONDANSETRON HCL 4 MG/2ML IJ SOLN
INTRAMUSCULAR | Status: AC
  Filled 2016-02-05: qty 2

## 2016-02-05 NOTE — Progress Notes (Signed)
ANTICOAGULATION CONSULT NOTE - Initial Consult  Pharmacy Consult for Heparin  Indication: atrial fibrillation  No Known Allergies  Patient Measurements: Height: 5\' 4"  (162.6 cm) Weight: 245 lb 2.4 oz (111.2 kg) IBW/kg (Calculated) : 54.7 Heparin Dosing Weight: 84kg  Vital Signs: Temp: 98.6 F (37 C) (03/24 2000) Temp Source: Oral (03/24 2000) BP: 167/67 mmHg (03/24 2000) Pulse Rate: 80 (03/24 2000)  Labs:  Recent Labs  02/03/16 2246 02/03/16 2323  02/04/16 0211 02/04/16 0426 02/04/16 0815 02/04/16 1103 02/04/16 1602 02/05/16 0330 02/05/16 1053 02/05/16 1932  HGB 8.8* 9.9*  --  8.2*  --   --   --   --   --   --   --   HCT 28.0* 29.0*  --  26.7*  --   --   --   --   --   --   --   PLT 296  --   --  269  --   --   --   --   --   --   --   APTT  --   --   --   --   --   --   --   --   --  30 <20*  LABPROT  --   --   --   --   --   --   --   --   --  17.4*  --   INR  --   --   --   --   --   --   --   --   --  1.42  --   HEPARINUNFRC  --   --   --   --   --   --   --   --   --  0.79*  --   CREATININE 3.66* 3.30*  --  3.60*  --   --   --  4.21* 3.78*  --   --   TROPONINI  --   --   < > 0.04* 0.03 0.03 0.03  --   --   --   --   < > = values in this interval not displayed.  Estimated Creatinine Clearance: 21 mL/min (by C-G formula based on Cr of 3.78).   Assessment: 32 yof on xarelto PTA for afib, which has been on hold d/t AKI. She is currently on IV heparin. APTT = 20, subtherapeutic on 1000 units/hr.   Goal of Therapy:  Heparin level 0.3-0.7 units/ml aPTT 66-102 seconds Monitor platelets by anticoagulation protocol: Yes   Plan:  Increase heparin rate to 1200 units/hr F/u APTT with AM labs  Daily APTT, HL, CBC  Maryanna Shape, PharmD, BCPS  Clinical Pharmacist  Pager: [phone]  02/05/2016 8:38 PM

## 2016-02-05 NOTE — Progress Notes (Signed)
Inpatient Diabetes Program Recommendations  AACE/ADA: New Consensus Statement on Inpatient Glycemic Control (2015)  Target Ranges:  Prepandial:   less than 140 mg/dL      Peak postprandial:   less than 180 mg/dL (1-2 hours)      Critically ill patients:  140 - 180 mg/dL   Review of Glycemic Control  Results for MYHA, MECHANIC (MRN [id number]) as of 02/05/2016 12:20  Ref. Range 02/04/2016 22:30 02/04/2016 23:48 02/05/2016 01:21 02/05/2016 07:59  Glucose-Capillary Latest Ref Range: 65-99 mg/dL 458 (H) 471 (H) 421 (H) 224 (H)   Needs meal coverage insulin. Eating 100%.  Inpatient Diabetes Program Recommendations:    Add Novolog 8 units tidwc for meal coverage insulin. Updated HgbA1C.  Continue to follow. Thank you. Lorenda Peck, RD, LDN, CDE Inpatient Diabetes Coordinator [phone]

## 2016-02-05 NOTE — Progress Notes (Signed)
PATIENT ID: [name] is a 73F with diabetes, hypothyroidism, COPD, polysubstance abuse, coronary artery disease status post non-ST elevation MI, and cirrhosis here with recurrent symptomatic bradycardia.  INTERVAL HISTORY: Heart rate remains in the 30s to 40s.  SUBJECTIVE:  [name] is feeling well overall. However she notes that her legs feel weak and she is dizzy especially when walking.    PHYSICAL EXAM Filed Vitals:   02/05/16 0600 02/05/16 0700 02/05/16 0735 02/05/16 0756  BP: 129/109 118/79  113/58  Pulse: 39 139  41  Temp:    97.5 F (36.4 C)  TempSrc:    Oral  Resp: 16 14  16   Height:      Weight:      SpO2: 94% 98% 100% 100%   General:  Chronically ill-appearing. No acute distress. Neck: No JVD Lungs:  Clear to auscultation bilaterally. No crackles, no rhonchi, or wheezes. Heart:  Bradycardic, regular rhythm. No murmurs, rubs, or gallops. Normal S1/S2. Abdomen:  Soft. Nontender, nondistended. Active bowel sounds. Obese. Extremities:  Warm and well-perfused. No edema  LABS: Lab Results  Component Value Date   TROPONINI 0.03 02/04/2016   Results for orders placed or performed during the hospital encounter of 02/03/16 (from the past 24 hour(s))  Troponin I     Status: None   Collection Time: 02/04/16 11:03 AM  Result Value Ref Range   Troponin I 0.03 <0.031 ng/mL  Glucose, capillary     Status: Abnormal   Collection Time: 02/04/16 11:43 AM  Result Value Ref Range   Glucose-Capillary 288 (H) 65 - 99 mg/dL   Comment 1 Notify RN   Glucose, capillary     Status: Abnormal   Collection Time: 02/04/16  3:39 PM  Result Value Ref Range   Glucose-Capillary 154 (H) 65 - 99 mg/dL   Comment 1 Capillary Specimen   Basic metabolic panel     Status: Abnormal   Collection Time: 02/04/16  4:02 PM  Result Value Ref Range   Sodium 137 135 - 145 mmol/L   Potassium 4.9 3.5 - 5.1 mmol/L   Chloride 99 (L) 101 - 111 mmol/L   CO2 26 22 - 32 mmol/L   Glucose, Bld 161 (H) 65 -  99 mg/dL   BUN 83 (H) 6 - 20 mg/dL   Creatinine, Ser 4.21 (H) 0.44 - 1.00 mg/dL   Calcium 8.8 (L) 8.9 - 10.3 mg/dL   GFR calc non Af Amer 11 (L) >60 mL/min   GFR calc Af Amer 13 (L) >60 mL/min   Anion gap 12 5 - 15  Ferritin     Status: None   Collection Time: 02/04/16  4:02 PM  Result Value Ref Range   Ferritin 45 11 - 307 ng/mL  Iron and TIBC     Status: Abnormal   Collection Time: 02/04/16  4:02 PM  Result Value Ref Range   Iron 34 28 - 170 ug/dL   TIBC 357 250 - 450 ug/dL   Saturation Ratios 10 (L) 10.4 - 31.8 %   UIBC 323 ug/dL  Glucose, capillary     Status: Abnormal   Collection Time: 02/04/16 10:30 PM  Result Value Ref Range   Glucose-Capillary 458 (H) 65 - 99 mg/dL   Comment 1 Capillary Specimen   Glucose, capillary     Status: Abnormal   Collection Time: 02/04/16 11:48 PM  Result Value Ref Range   Glucose-Capillary 471 (H) 65 - 99 mg/dL   Comment 1 Capillary Specimen  Glucose, capillary     Status: Abnormal   Collection Time: 02/05/16  1:21 AM  Result Value Ref Range   Glucose-Capillary 421 (H) 65 - 99 mg/dL   Comment 1 Capillary Specimen   Renal function panel     Status: Abnormal   Collection Time: 02/05/16  3:30 AM  Result Value Ref Range   Sodium 132 (L) 135 - 145 mmol/L   Potassium 4.9 3.5 - 5.1 mmol/L   Chloride 96 (L) 101 - 111 mmol/L   CO2 25 22 - 32 mmol/L   Glucose, Bld 341 (H) 65 - 99 mg/dL   BUN 86 (H) 6 - 20 mg/dL   Creatinine, Ser 3.78 (H) 0.44 - 1.00 mg/dL   Calcium 8.3 (L) 8.9 - 10.3 mg/dL   Phosphorus 6.5 (H) 2.5 - 4.6 mg/dL   Albumin 2.9 (L) 3.5 - 5.0 g/dL   GFR calc non Af Amer 13 (L) >60 mL/min   GFR calc Af Amer 15 (L) >60 mL/min   Anion gap 11 5 - 15  Urine rapid drug screen (hosp performed)     Status: Abnormal   Collection Time: 02/05/16  7:11 AM  Result Value Ref Range   Opiates POSITIVE (A) NONE DETECTED   Cocaine NONE DETECTED NONE DETECTED   Benzodiazepines NONE DETECTED NONE DETECTED   Amphetamines NONE DETECTED NONE  DETECTED   Tetrahydrocannabinol NONE DETECTED NONE DETECTED   Barbiturates NONE DETECTED NONE DETECTED  Urinalysis, Routine w reflex microscopic (not at [facility])     Status: Abnormal   Collection Time: 02/05/16  7:11 AM  Result Value Ref Range   Color, Urine YELLOW YELLOW   APPearance CLOUDY (A) CLEAR   Specific Gravity, Urine 1.016 1.005 - 1.030   pH 5.0 5.0 - 8.0   Glucose, UA NEGATIVE NEGATIVE mg/dL   Hgb urine dipstick NEGATIVE NEGATIVE   Bilirubin Urine NEGATIVE NEGATIVE   Ketones, ur NEGATIVE NEGATIVE mg/dL   Protein, ur NEGATIVE NEGATIVE mg/dL   Nitrite NEGATIVE NEGATIVE   Leukocytes, UA MODERATE (A) NEGATIVE  Creatinine, urine, random     Status: None   Collection Time: 02/05/16  7:11 AM  Result Value Ref Range   Creatinine, Urine 127.17 mg/dL  Sodium, urine, random     Status: None   Collection Time: 02/05/16  7:11 AM  Result Value Ref Range   Sodium, Ur 17 mmol/L  Urine microscopic-add on     Status: Abnormal   Collection Time: 02/05/16  7:11 AM  Result Value Ref Range   Squamous Epithelial / LPF 6-30 (A) NONE SEEN   WBC, UA 6-30 0 - 5 WBC/hpf   RBC / HPF 0-5 0 - 5 RBC/hpf   Bacteria, UA MANY (A) NONE SEEN    Intake/Output Summary (Last 24 hours) at 02/05/16 [id number] Last data filed at 02/05/16 0700  Gross per 24 hour  Intake   1263 ml  Output      0 ml  Net   1263 ml    Telemetry: Bradycardia rates 30s to 40s.  ASSESSMENT AND PLAN:  Active Problems:   Symptomatic bradycardia   Bradycardia   Hyperkalemia   # SSS, Atrial fibrillation: She continues to have bradycardia off all nodal agents and amiodarone. We will continue to monitor for now as she is not in ideal candidate for PPM placement.  We will attempt to restart amiodarone when her heart rate is stable.  She is on Xarelto as an outpatient but this is held due to her acute  renal failure.  We will start heparin for anticoagulation as her Xarelto has been held for over 2 days and her  CHA2DS2-VASc Score is 5.    This patients CHA2DS2-VASc Score and unadjusted Ischemic Stroke Rate (% per year) is equal to 7.2 % stroke rate/year from a score of 5  Above score calculated as 1 point each if present [CHF, HTN, DM, Vascular=MI/PAD/Aortic Plaque, Age if 65-74, or Female] Above score calculated as 2 points each if present [Age > 75, or Stroke/TIA/TE]  # AKI: This is thought to taking too much torsemide. Her renal function is improving still quite poor.  She continues to receive gentle IV fluids. Appreciate nephrology assistance.  # DM Type 2: Glucose has been poorly controlled. Management her diabetes coordinator.  Appreciate their assistance.  Time spent: 25 minutes-Greater than 50% of this time was spent in counseling, explanation of diagnosis, planning of further management, and coordination of care.    Michayla Mcneil C. Oval Linsey, MD, [facility] 02/05/2016 9:38 AM

## 2016-02-05 NOTE — Progress Notes (Signed)
Pt. Blood glucose at 2200 was 458.  Was rechecked an hour later and was 471.  MD made aware and ordered sliding scale coverage.  15 units Novolog given.  Blood glucose rechecked within an hour and was 451.  MD made aware and ordered 10 more units of Novolog. Will continue to monitor.

## 2016-02-05 NOTE — Progress Notes (Signed)
ANTICOAGULATION CONSULT NOTE - Initial Consult  Pharmacy Consult for Heparin  Indication: atrial fibrillation  No Known Allergies  Patient Measurements: Height: 5\' 4"  (162.6 cm) Weight: 245 lb 2.4 oz (111.2 kg) IBW/kg (Calculated) : 54.7 Heparin Dosing Weight: 84kg  Vital Signs: Temp: 97.5 F (36.4 C) (03/24 0756) Temp Source: Oral (03/24 0756) BP: 113/58 mmHg (03/24 0756) Pulse Rate: 41 (03/24 0756)  Labs:  Recent Labs  02/03/16 2246 02/03/16 2323  02/04/16 0211 02/04/16 0426 02/04/16 0815 02/04/16 1103 02/04/16 1602 02/05/16 0330 02/05/16 1053  HGB 8.8* 9.9*  --  8.2*  --   --   --   --   --   --   HCT 28.0* 29.0*  --  26.7*  --   --   --   --   --   --   PLT 296  --   --  269  --   --   --   --   --   --   APTT  --   --   --   --   --   --   --   --   --  30  LABPROT  --   --   --   --   --   --   --   --   --  17.4*  INR  --   --   --   --   --   --   --   --   --  1.42  HEPARINUNFRC  --   --   --   --   --   --   --   --   --  0.79*  CREATININE 3.66* 3.30*  --  3.60*  --   --   --  4.21* 3.78*  --   TROPONINI  --   --   < > 0.04* 0.03 0.03 0.03  --   --   --   < > = values in this interval not displayed.  Estimated Creatinine Clearance: 21 mL/min (by C-G formula based on Cr of 3.78).   Medical History: Past Medical History  Diagnosis Date  . Alcohol abuse   . Narcotic abuse   . Cocaine abuse   . Marijuana abuse   . Tobacco abuse   . Alcoholic cirrhosis ([place])   . Cardiomyopathy ([place])   . Obesity   . Chronic combined systolic and diastolic CHF (congestive heart failure) ([place])     a. Last echo 12/205: EF 40-45%, inferoapical/posterior HK, not technically sufficient to allow eval of LV diastolic function, normal LA in size..  . Hypothyroidism   . GERD (gastroesophageal reflux disease)   . PAF (paroxysmal atrial fibrillation) ([place])   . Critical lower limb ischemia   . Hypertension   . CAD (coronary artery disease)     a. cath 11/10/2014 LM nl, LAD min  irregs, D1 30 ost, D2 50d, LCX 58m, OM1 80 p/m (1.5 mm vessel), OM2 98m, RCA nondom 39m-->med rx.. Demand ischemia in the setting of rapid a-fib.  [name] Anxiety   . Poorly controlled type 2 diabetes mellitus ([place])   . Hyperlipemia   . Peripheral arterial disease ([place])     a. 01/2015 Angio/PTA: LSFA 100 w/ recon @ adductor canal and 1 vessel runoff via AT, RSFA 99 (atherectomy/pta) - 1 vessel runoff via diff dzs peroneal.  . Carotid artery disease ([place])     a. 01/2015 Carotid Angio: RICA [id number], LICA [id number]. s/p  L carotid endarterectomy 02/2015.  [name] COPD (chronic obstructive pulmonary disease) ([place])   . Diabetic peripheral neuropathy ([place])   . Paroxysmal atrial tachycardia ([place])   . Noncompliance   . Anemia   . B12 deficiency   . Alcoholic cirrhosis ([place])   . NSVT (nonsustained ventricular tachycardia) ([place])   . Paroxysmal atrial tachycardia ([place])   . Hypokalemia   . Hypomagnesemia   . Renal insufficiency     a. 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  [name] Elevated troponin     a. Chronic elevation.     Assessment: 54yof admitted with bradycardia - home meds amiodarone, bisoprolol, diltiazem stopped.  On rivaroxaban prior to admit for Afib - now CVR.  AC held on admit given acute renal failure BL Cr 1> 4> trending back down 3.78 Start heparin drip.  CBC low stable.  HL 0.79  falsely elevated due to rivaroxaban will dose heparin based off of APTT until they start to correlate.     Goal of Therapy:  Heparin level 0.3-0.7 units/ml aPTT 66-102 seconds Monitor platelets by anticoagulation protocol: Yes   Plan:  No bolus  Heparin drip 1000 uts/hr APTT in 6hr Daily APTT, HL, CBC  Bonnita Nasuti Pharm.D. CPP, BCPS Clinical Pharmacist [phone] 02/05/2016 12:22 PM

## 2016-02-05 NOTE — Progress Notes (Signed)
Patient ID: [name], female   DOB: 03/01/1961, 55 y.o.   MRN: [id number]  [facility] Progress Note   Assessment/ Plan:   1. Acute renal failure on chronic kidney disease stage III: Acutely to have suffered ischemic renal injury with symptomatic bradycardia in the face of ongoing ACE inhibitor/diuretic use-urine output unfortunately not quantified but overall hemodynamic status appears to have improved. She remains bradycardic and off AV nodal blocking agents. No acute dialysis needs noted at this time. I would favor continuing normal saline at 50 mL/h at this time and not restarting diuretics for another 24 hours. 2. Hyperkalemia: Secondary to acute renal failure with ongoing ACE inhibitor/potassium supplement, this is further compounded by her hyperglycemia-insulin management per primary service. 3. Anemia: With significantly low iron saturations/low ferritin-give intravenous iron 4. Symptomatic Bradycardia: appears to be partly from pharmacological reasons-patient taking oral medications. Currently off of AVN blocking agents and ongoing telemetry monitoring. Further management per cardiology.  Subjective:   Reports to be feeling well except for some tremors-had some nausea this morning    Objective:   BP 113/58 mmHg  Pulse 41  Temp(Src) 97.5 F (36.4 C) (Oral)  Resp 16  Ht 5\' 4"  (1.626 m)  Wt 111.2 kg (245 lb 2.4 oz)  BMI 42.06 kg/m2  SpO2 100%  LMP 11/27/2012  Intake/Output Summary (Last 24 hours) at 02/05/16 [id number] Last data filed at 02/05/16 0700  Gross per 24 hour  Intake   1263 ml  Output      0 ml  Net   1263 ml   Weight change: 3.244 kg (7 lb 2.4 oz)  Physical Exam: Gen: Comfortably sitting up in recliner, eating breakfast CVS: Pulse regular bradycardia, S1 and S2 normal Resp: Decreased breath sounds over bases-no rales Abd: Soft, obese, nontender Ext: 1-2+ lower extremity edema  Imaging: US Renal  02/04/2016  CLINICAL DATA:  Acute on chronic  renal failure. EXAM: RENAL / URINARY TRACT ULTRASOUND COMPLETE COMPARISON:  CT abdomen and pelvis 07/04/2015 FINDINGS: Right Kidney: Length: 13.0 cm. Echogenic parenchyma with mild cortical thinning. 2.2 x 1.6 x 2.0 cm hyperechoic lesion in the lower pole corresponding to the fact containing lesion on prior CT. No hydronephrosis. Left Kidney: Length: 12.4 cm. Increased parenchymal echogenicity. No mass or hydronephrosis visualized. Bladder: Appears normal for degree of bladder distention. IMPRESSION: 1. Echogenic kidneys compatible with medical renal disease. No hydronephrosis. 2. 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 Electronically Signed   By: Logan Bores M.D.   On: 02/04/2016 23:45   Dg Chest Portable 1 View  02/03/2016  CLINICAL DATA:  Bradycardia tonight. Shortness of breath. Dizziness and weakness. EXAM: PORTABLE CHEST 1 VIEW COMPARISON:  01/12/2016 FINDINGS: Shallow inspiration with atelectasis in the lung bases. Mild cardiac enlargement. No pulmonary vascular congestion. No focal airspace disease or consolidation. No blunting of costophrenic angles. No pneumothorax. Calcification of the aorta. IMPRESSION: Shallow inspiration with atelectasis in the lung bases. Cardiac enlargement. Electronically Signed   By: Lucienne Capers M.D.   On: 02/03/2016 23:32    Labs: BMET  Recent Labs Lab 02/03/16 2246 02/03/16 2323 02/04/16 0211 02/04/16 1602 02/05/16 0330  NA 133* 133* 134* 137 132*  K 5.7* 5.7* 6.0* 4.9 4.9  CL 94* 94* 96* 99* 96*  CO2 28  --  26 26 25   GLUCOSE 300* 293* 325* 161* 341*  BUN 67* 74* 71* 83* 86*  CREATININE 3.66* 3.30* 3.60* 4.21* 3.78*  CALCIUM 8.6*  --  8.8* 8.8* 8.3*  PHOS  --   --   --   --  6.5*   CBC  Recent Labs Lab 02/03/16 2246 02/03/16 2323 02/04/16 0211  WBC 9.2  --  10.7*  NEUTROABS 5.7  --   --   HGB 8.8* 9.9* 8.2*  HCT 28.0* 29.0* 26.7*  MCV 87.0  --  86.4  PLT 296  --  269    Medications:    . atorvastatin  40 mg Oral q1800  . folic acid  1  mg Oral Daily  . insulin aspart  0-15 Units Subcutaneous TID WC  . insulin glargine  70 Units Subcutaneous QHS  . levothyroxine  50 mcg Oral QAC breakfast  . mometasone-formoterol  2 puff Inhalation BID  . ondansetron      . pantoprazole  40 mg Oral Daily  . sodium chloride flush  3 mL Intravenous Q12H  . tiotropium  18 mcg Inhalation Daily  . vitamin B-12  1,000 mcg Oral Daily   Elmarie Shiley, MD 02/05/2016, 9:16 AM

## 2016-02-06 DIAGNOSIS — Z7901 Long term (current) use of anticoagulants: Secondary | ICD-10-CM

## 2016-02-06 LAB — CBC
HCT: 28.7 % — ABNORMAL LOW (ref 36.0–46.0)
HEMOGLOBIN: 9.1 g/dL — AB (ref 12.0–15.0)
MCH: 27.7 pg (ref 26.0–34.0)
MCHC: 31.7 g/dL (ref 30.0–36.0)
MCV: 87.5 fL (ref 78.0–100.0)
Platelets: 241 10*3/uL (ref 150–400)
RBC: 3.28 MIL/uL — ABNORMAL LOW (ref 3.87–5.11)
RDW: 14.3 % (ref 11.5–15.5)
WBC: 8 10*3/uL (ref 4.0–10.5)

## 2016-02-06 LAB — GLUCOSE, CAPILLARY
GLUCOSE-CAPILLARY: 115 mg/dL — AB (ref 65–99)
GLUCOSE-CAPILLARY: 231 mg/dL — AB (ref 65–99)
GLUCOSE-CAPILLARY: 266 mg/dL — AB (ref 65–99)
Glucose-Capillary: 284 mg/dL — ABNORMAL HIGH (ref 65–99)
Glucose-Capillary: 255 mg/dL — ABNORMAL HIGH (ref 65–99)
Glucose-Capillary: 213 mg/dL — ABNORMAL HIGH (ref 65–99)

## 2016-02-06 LAB — RENAL FUNCTION PANEL
ALBUMIN: 2.8 g/dL — AB (ref 3.5–5.0)
Anion gap: 8 (ref 5–15)
BUN: 47 mg/dL — AB (ref 6–20)
CO2: 27 mmol/L (ref 22–32)
Calcium: 8.9 mg/dL (ref 8.9–10.3)
Chloride: 102 mmol/L (ref 101–111)
Creatinine, Ser: 1.62 mg/dL — ABNORMAL HIGH (ref 0.44–1.00)
GFR calc Af Amer: 41 mL/min — ABNORMAL LOW (ref >60)
GFR, EST NON AFRICAN AMERICAN: 35 mL/min — AB (ref >60)
Glucose, Bld: 176 mg/dL — ABNORMAL HIGH (ref 65–99)
PHOSPHORUS: 4.2 mg/dL (ref 2.5–4.6)
POTASSIUM: 4.7 mmol/L (ref 3.5–5.1)
Sodium: 137 mmol/L (ref 135–145)

## 2016-02-06 LAB — APTT
APTT: 40 s — AB (ref 24–37)
APTT: 47 s — AB (ref 24–37)
APTT: 74 s — AB (ref 24–37)
aPTT: 92 seconds — ABNORMAL HIGH (ref 24–37)

## 2016-02-06 LAB — HEPARIN LEVEL (UNFRACTIONATED): HEPARIN UNFRACTIONATED: 0.43 [IU]/mL (ref 0.30–0.70)

## 2016-02-06 MED ORDER — GABAPENTIN 300 MG PO CAPS
300.0000 mg | ORAL_CAPSULE | Freq: Four times a day (QID) | ORAL | Status: DC | PRN
Start: 2016-02-06 — End: 2016-02-08
  Administered 2016-02-06 – 2016-02-08 (×6): 300 mg via ORAL
  Filled 2016-02-06 (×6): qty 1

## 2016-02-06 MED ORDER — WARFARIN SODIUM 7.5 MG PO TABS
7.5000 mg | ORAL_TABLET | Freq: Once | ORAL | Status: AC
  Administered 2016-02-06: 7.5 mg via ORAL
  Filled 2016-02-06: qty 1

## 2016-02-06 MED ORDER — DIPHENHYDRAMINE HCL 25 MG PO CAPS
25.0000 mg | ORAL_CAPSULE | Freq: Once | ORAL | Status: AC
  Administered 2016-02-06: 25 mg via ORAL
  Filled 2016-02-06: qty 1

## 2016-02-06 MED ORDER — ALPRAZOLAM 0.5 MG PO TABS
0.5000 mg | ORAL_TABLET | ORAL | Status: DC | PRN
  Administered 2016-02-06: 0.5 mg via ORAL
  Filled 2016-02-06: qty 1

## 2016-02-06 MED ORDER — WARFARIN - PHARMACIST DOSING INPATIENT: Freq: Every day | Status: DC

## 2016-02-06 NOTE — Progress Notes (Signed)
ANTICOAGULATION CONSULT NOTE - Follow Up Consult  Pharmacy Consult for Heparin Indication: Atrial fibrillation  No Known Allergies  Patient Measurements: Height: 5\' 4"  (162.6 cm) Weight: 239 lb 13.8 oz (108.8 kg) IBW/kg (Calculated) : 54.7 Heparin Dosing Weight: 84 kg  Vital Signs: Temp: 98 F (36.7 C) (03/25 1140) Temp Source: Oral (03/25 1140) BP: 144/63 mmHg (03/25 1200) Pulse Rate: 88 (03/25 1200)  Labs:  Recent Labs  02/03/16 2246 02/03/16 2323  02/04/16 0211 02/04/16 0426 02/04/16 0815 02/04/16 1103 02/04/16 1602 02/05/16 0330 02/05/16 1053  02/06/16 0210 02/06/16 0933 02/06/16 1430  HGB 8.8* 9.9*  --  8.2*  --   --   --   --   --   --   --  9.1*  --   --   HCT 28.0* 29.0*  --  26.7*  --   --   --   --   --   --   --  28.7*  --   --   PLT 296  --   --  269  --   --   --   --   --   --   --  241  --   --   APTT  --   --   --   --   --   --   --   --   --  30  < > 40* 47* 74*  LABPROT  --   --   --   --   --   --   --   --   --  17.4*  --   --   --   --   INR  --   --   --   --   --   --   --   --   --  1.42  --   --   --   --   HEPARINUNFRC  --   --   --   --   --   --   --   --   --  0.79*  --  0.43  --   --   CREATININE 3.66* 3.30*  --  3.60*  --   --   --  4.21* 3.78*  --   --  1.62*  --   --   TROPONINI  --   --   < > 0.04* 0.03 0.03 0.03  --   --   --   --   --   --   --   < > = values in this interval not displayed.  Estimated Creatinine Clearance: 48.4 mL/min (by C-G formula based on Cr of 1.62).   Assessment: 41 yof on Xarelto PTA for afib, which has been on hold d/t AKI. She is currently on IV heparin which is being dosed based on aPTT due to false elevation of HL with NOACs. Switched from Xarelto to warfarin today due to decline in renal function. 6 hour aPTT = 74 sec, therapeutic after heparin infusion increased to  1800 units/hr. No bleeding noted.   Goal of Therapy:  aPTT 66-102 seconds Heparin level 0.3-0.7 units/ml  Monitor platelets by  anticoagulation protocol: Yes   Plan:  Continue IV heparin drip at current rate of 1800 units/hr. Check aPTT in 6 hours to confirm therapeutic x2 consecutive results before changing to only daily aPTT's.    Thank you for allowing pharmacy to be part of this patients care team. Nicole Cella,  RPh Clinical Pharmacist Pager: [phone] 02/06/2016,4:10 PM

## 2016-02-06 NOTE — Progress Notes (Signed)
ANTICOAGULATION CONSULT NOTE - Initial Consult  Pharmacy Consult for Heparin/warfarin  Indication: atrial fibrillation  No Known Allergies  Patient Measurements: Height: 5\' 4"  (162.6 cm) Weight: 239 lb 13.8 oz (108.8 kg) IBW/kg (Calculated) : 54.7 Heparin Dosing Weight: 84kg  Vital Signs: Temp: 98.1 F (36.7 C) (03/25 0722) Temp Source: Oral (03/25 0722) BP: 141/58 mmHg (03/25 0900) Pulse Rate: 80 (03/25 0900)  Labs:  Recent Labs  02/03/16 2246 02/03/16 2323  02/04/16 0211 02/04/16 0426 02/04/16 0815 02/04/16 1103 02/04/16 1602 02/05/16 0330 02/05/16 1053 02/05/16 1932 02/06/16 0210  HGB 8.8* 9.9*  --  8.2*  --   --   --   --   --   --   --  9.1*  HCT 28.0* 29.0*  --  26.7*  --   --   --   --   --   --   --  28.7*  PLT 296  --   --  269  --   --   --   --   --   --   --  241  APTT  --   --   --   --   --   --   --   --   --  30 <20* 40*  LABPROT  --   --   --   --   --   --   --   --   --  17.4*  --   --   INR  --   --   --   --   --   --   --   --   --  1.42  --   --   HEPARINUNFRC  --   --   --   --   --   --   --   --   --  0.79*  --  0.43  CREATININE 3.66* 3.30*  --  3.60*  --   --   --  4.21* 3.78*  --   --  1.62*  TROPONINI  --   --   < > 0.04* 0.03 0.03 0.03  --   --   --   --   --   < > = values in this interval not displayed.  Estimated Creatinine Clearance: 48.4 mL/min (by C-G formula based on Cr of 1.62).   Assessment: 27 yof on xarelto PTA for afib, which has been on hold d/t AKI. She is currently on IV heparin which is being dosed based on aPTT due to false elevation of HL with NOACs. Patient to start warfarin now due to decline in renal function. aPTT today 40, subtherapeutic on 1500 units/hr. CBC stable, no bleeding noted.   Goal of Therapy:  Heparin level 0.3-0.7 units/ml aPTT 66-102 seconds Monitor platelets by anticoagulation protocol: Yes   Plan:  Increase heparin rate to 1800 units/hr Warfarin 7.5mg  tonight*1 F/u aPTT in 6 hours  Daily  APTT, HL, CBC, INR   Melburn Popper, PharmD Clinical Pharmacy Resident Pager: [phone] 02/06/2016 10:37 AM

## 2016-02-06 NOTE — Discharge Instructions (Signed)

## 2016-02-06 NOTE — Progress Notes (Signed)
Patient ID: [name], female   DOB: 1961/10/21, 55 y.o.   MRN: [id number]   PATIENT ID: [name] is a 55F with diabetes, hypothyroidism, COPD, polysubstance abuse, coronary artery disease status post non-ST elevation MI, and cirrhosis here with recurrent symptomatic bradycardia.  INTERVAL HISTORY: Heart rate improved in 80's    SUBJECTIVE:  Needs to go to bathroom  No dizziness or pre syncope    PHYSICAL EXAM Filed Vitals:   02/06/16 0500 02/06/16 0600 02/06/16 0722 02/06/16 0730  BP:  141/75 116/56   Pulse: 79 82 77   Temp:   98.1 F (36.7 C)   TempSrc:   Oral   Resp: 13 16 12    Height:      Weight:      SpO2: 100% 100% 100% 100%   General:  Chronically ill-appearing. No acute distress. Neck: No JVD Lungs:  Clear to auscultation bilaterally. No crackles, no rhonchi, or wheezes. Heart:  Bradycardic, regular rhythm. No murmurs, rubs, or gallops. Normal S1/S2. Abdomen:  Soft. Nontender, nondistended. Active bowel sounds. Obese. Extremities:  Warm and well-perfused. No edema  LABS: Lab Results  Component Value Date   TROPONINI 0.03 02/04/2016   Results for orders placed or performed during the hospital encounter of 02/03/16 (from the past 24 hour(s))  Glucose, capillary     Status: Abnormal   Collection Time: 02/05/16  7:59 AM  Result Value Ref Range   Glucose-Capillary 224 (H) 65 - 99 mg/dL   Comment 1 Notify RN   Heparin level (unfractionated)     Status: Abnormal   Collection Time: 02/05/16 10:53 AM  Result Value Ref Range   Heparin Unfractionated 0.79 (H) 0.30 - 0.70 IU/mL  Protime-INR     Status: Abnormal   Collection Time: 02/05/16 10:53 AM  Result Value Ref Range   Prothrombin Time 17.4 (H) 11.6 - 15.2 seconds   INR 1.42 0.00 - 1.49  APTT     Status: None   Collection Time: 02/05/16 10:53 AM  Result Value Ref Range   aPTT 30 24 - 37 seconds  Glucose, capillary     Status: Abnormal   Collection Time: 02/05/16 12:53 PM  Result Value Ref Range   Glucose-Capillary 202 (H) 65 - 99 mg/dL   Comment 1 Notify RN   Glucose, capillary     Status: Abnormal   Collection Time: 02/05/16  4:04 PM  Result Value Ref Range   Glucose-Capillary 218 (H) 65 - 99 mg/dL   Comment 1 Notify RN   APTT     Status: Abnormal   Collection Time: 02/05/16  7:32 PM  Result Value Ref Range   aPTT <20 (L) 24 - 37 seconds  Glucose, capillary     Status: Abnormal   Collection Time: 02/05/16 10:18 PM  Result Value Ref Range   Glucose-Capillary 222 (H) 65 - 99 mg/dL   Comment 1 Capillary Specimen   Renal function panel     Status: Abnormal   Collection Time: 02/06/16  2:10 AM  Result Value Ref Range   Sodium 137 135 - 145 mmol/L   Potassium 4.7 3.5 - 5.1 mmol/L   Chloride 102 101 - 111 mmol/L   CO2 27 22 - 32 mmol/L   Glucose, Bld 176 (H) 65 - 99 mg/dL   BUN 47 (H) 6 - 20 mg/dL   Creatinine, Ser 1.62 (H) 0.44 - 1.00 mg/dL   Calcium 8.9 8.9 - 10.3 mg/dL   Phosphorus 4.2 2.5 - 4.6 mg/dL  Albumin 2.8 (L) 3.5 - 5.0 g/dL   GFR calc non Af Amer 35 (L) >60 mL/min   GFR calc Af Amer 41 (L) >60 mL/min   Anion gap 8 5 - 15  Heparin level (unfractionated)     Status: None   Collection Time: 02/06/16  2:10 AM  Result Value Ref Range   Heparin Unfractionated 0.43 0.30 - 0.70 IU/mL  CBC     Status: Abnormal   Collection Time: 02/06/16  2:10 AM  Result Value Ref Range   WBC 8.0 4.0 - 10.5 K/uL   RBC 3.28 (L) 3.87 - 5.11 MIL/uL   Hemoglobin 9.1 (L) 12.0 - 15.0 g/dL   HCT 28.7 (L) 36.0 - 46.0 %   MCV 87.5 78.0 - 100.0 fL   MCH 27.7 26.0 - 34.0 pg   MCHC 31.7 30.0 - 36.0 g/dL   RDW 14.3 11.5 - 15.5 %   Platelets 241 150 - 400 K/uL  APTT     Status: Abnormal   Collection Time: 02/06/16  2:10 AM  Result Value Ref Range   aPTT 40 (H) 24 - 37 seconds    Intake/Output Summary (Last 24 hours) at 02/06/16 0737 Last data filed at 02/06/16 0600  Gross per 24 hour  Intake 2306.16 ml  Output   3600 ml  Net -1293.84 ml    Telemetry: SR rates 70-90   Lab  Results  Component Value Date   CREATININE 1.62* 02/06/2016   BUN 47* 02/06/2016   NA 137 02/06/2016   K 4.7 02/06/2016   CL 102 02/06/2016   CO2 27 02/06/2016    ASSESSMENT AND PLAN:  Active Problems:   DM (diabetes mellitus), type 2 with peripheral vascular complications ([name])   Long term current use of anticoagulant therapy   DM type 2, uncontrolled, with neuropathy ([name])   Symptomatic bradycardia   Bradycardia   Hyperkalemia   # SSS, Atrial fibrillation: See consult note EP no pacer Avoid multiple AV nodal drugs.  Cannot be on NOAC given renal failure will ask pharmacy to start coumadin On heparin now Currently HR much improved  CHADVASC Score is 5   # AKI:  Improved follow diuretic on hold    # DM Type 2: Glucose has been poorly controlled. Management her diabetes coordinator.  Appreciate their assistance.  Time spent: 25 minutes-Greater than 50% of this time was spent in counseling, explanation of diagnosis, planning of further management, and coordination of care.    Jenkins Rouge

## 2016-02-06 NOTE — Progress Notes (Signed)
ANTICOAGULATION CONSULT NOTE - Follow Up Consult  Pharmacy Consult for Heparin Indication: Atrial fibrillation  No Known Allergies  Patient Measurements: Height: 5\' 4"  (162.6 cm) Weight: 239 lb 13.8 oz (108.8 kg) IBW/kg (Calculated) : 54.7 Heparin Dosing Weight: 84 kg  Vital Signs: Temp: 98.4 F (36.9 C) (03/25 2157) Temp Source: Oral (03/25 2157) BP: 120/64 mmHg (03/25 2157) Pulse Rate: 82 (03/25 2157)  Labs:  Recent Labs  02/03/16 2246 02/03/16 2323  02/04/16 0211 02/04/16 0426 02/04/16 0815 02/04/16 1103 02/04/16 1602 02/05/16 0330 02/05/16 1053  02/06/16 0210 02/06/16 0933 02/06/16 1430 02/06/16 2012  HGB 8.8* 9.9*  --  8.2*  --   --   --   --   --   --   --  9.1*  --   --   --   HCT 28.0* 29.0*  --  26.7*  --   --   --   --   --   --   --  28.7*  --   --   --   PLT 296  --   --  269  --   --   --   --   --   --   --  241  --   --   --   APTT  --   --   --   --   --   --   --   --   --  30  < > 40* 47* 74* 92*  LABPROT  --   --   --   --   --   --   --   --   --  17.4*  --   --   --   --   --   INR  --   --   --   --   --   --   --   --   --  1.42  --   --   --   --   --   HEPARINUNFRC  --   --   --   --   --   --   --   --   --  0.79*  --  0.43  --   --   --   CREATININE 3.66* 3.30*  --  3.60*  --   --   --  4.21* 3.78*  --   --  1.62*  --   --   --   TROPONINI  --   --   < > 0.04* 0.03 0.03 0.03  --   --   --   --   --   --   --   --   < > = values in this interval not displayed.  Estimated Creatinine Clearance: 48.4 mL/min (by C-G formula based on Cr of 1.62).   Assessment: 49 yof on Xarelto PTA for afib, which has been on hold d/t AKI. She is currently on IV heparin which is being dosed based on aPTT due to false elevation of HL with NOACs. Switched from Xarelto to warfarin due to decline in renal function.  aPTT tonight is 92 sec, remains therapeutic x2 on heparin infusion 1800 units/hr. No bleeding noted.   Goal of Therapy:  aPTT 66-102  seconds Heparin level 0.3-0.7 units/ml  Monitor platelets by anticoagulation protocol: Yes   Plan:  Continue IV heparin drip at current rate of 1800 units/hr. Daily aPTT, CBC.    Thank you for allowing  pharmacy to be part of this patients care team. Nicole Cella, RPh Clinical Pharmacist Pager: [phone] 02/06/2016,10:04 PM

## 2016-02-06 NOTE — Progress Notes (Signed)
Patient ID: [name], female   DOB: 1961/04/12, 55 y.o.   MRN: [id number]  [facility] Progress Note   Assessment/ Plan:   1. Acute renal failure on chronic kidney disease stage III: Likely suffered ischemic renal injury with symptomatic bradycardia in the face of ongoing ACE inhibitor/diuretic use +/- inadvertent over-diuresis. Renal function continues to improve and she has excellent urine output without any acute dialysis needs at this time. I will discontinue intravenous fluids and monitor her renal function/urine output to assess for possible intervention-modification of therapy. If she continues to have improving renal function tomorrow, start low-dose diuretic.  2. Hyperkalemia: corrected at this time. Initially from acute on chronic renal failure, ACE inhibitor and potassium supplement as well as hyperglycemia.  3. Anemia: noted to have significantly low iron saturation-intravenous iron given.  4. Symptomatic Bradycardia: secondary to AV nodal blocking agents-adjustment of therapy per cardiology recommendations .  Subjective:    Reports that she continues to feel well, ambulated around her room and transferred without problems     Objective:   BP 149/69 mmHg  Pulse 111  Temp(Src) 98.1 F (36.7 C) (Oral)  Resp 26  Ht 5\' 4"  (1.626 m)  Wt 108.8 kg (239 lb 13.8 oz)  BMI 41.15 kg/m2  SpO2 96%  LMP 11/27/2012  Intake/Output Summary (Last 24 hours) at 02/06/16 0853 Last data filed at 02/06/16 0800  Gross per 24 hour  Intake 3236.16 ml  Output   4000 ml  Net -763.84 ml   Weight change: -2.4 kg (-5 lb 4.7 oz)  Physical Exam: Gen: Comfortably sitting up in recliner watching television CVS: Pulse regular tachycardia S1 and S2 normal Resp: Decreased breath sounds over bases-no rales Abd: Soft, obese, nontender Ext: 1+ lower extremity edema  Imaging: US Renal  02/04/2016  CLINICAL DATA:  Acute on chronic renal failure. EXAM: RENAL / URINARY TRACT ULTRASOUND  COMPLETE COMPARISON:  CT abdomen and pelvis 07/04/2015 FINDINGS: Right Kidney: Length: 13.0 cm. Echogenic parenchyma with mild cortical thinning. 2.2 x 1.6 x 2.0 cm hyperechoic lesion in the lower pole corresponding to the fact containing lesion on prior CT. No hydronephrosis. Left Kidney: Length: 12.4 cm. Increased parenchymal echogenicity. No mass or hydronephrosis visualized. Bladder: Appears normal for degree of bladder distention. IMPRESSION: 1. Echogenic kidneys compatible with medical renal disease. No hydronephrosis. 2. 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 Electronically Signed   By: Logan Bores M.D.   On: 02/04/2016 23:45    Labs: BMET  Recent Labs Lab 02/03/16 2246 02/03/16 2323 02/04/16 0211 02/04/16 1602 02/05/16 0330 02/06/16 0210  NA 133* 133* 134* 137 132* 137  K 5.7* 5.7* 6.0* 4.9 4.9 4.7  CL 94* 94* 96* 99* 96* 102  CO2 28  --  26 26 25 27   GLUCOSE 300* 293* 325* 161* 341* 176*  BUN 67* 74* 71* 83* 86* 47*  CREATININE 3.66* 3.30* 3.60* 4.21* 3.78* 1.62*  CALCIUM 8.6*  --  8.8* 8.8* 8.3* 8.9  PHOS  --   --   --   --  6.5* 4.2   CBC  Recent Labs Lab 02/03/16 2246 02/03/16 2323 02/04/16 0211 02/06/16 0210  WBC 9.2  --  10.7* 8.0  NEUTROABS 5.7  --   --   --   HGB 8.8* 9.9* 8.2* 9.1*  HCT 28.0* 29.0* 26.7* 28.7*  MCV 87.0  --  86.4 87.5  PLT 296  --  269 241    Medications:    . amLODipine  5 mg  Oral Daily  . atorvastatin  40 mg Oral q1800  . ferumoxytol  510 mg Intravenous Weekly  . folic acid  1 mg Oral Daily  . insulin aspart  0-15 Units Subcutaneous TID WC  . insulin glargine  70 Units Subcutaneous QHS  . levothyroxine  50 mcg Oral QAC breakfast  . mometasone-formoterol  2 puff Inhalation BID  . pantoprazole  40 mg Oral Daily  . sodium chloride flush  3 mL Intravenous Q12H  . tiotropium  18 mcg Inhalation Daily  . vitamin B-12  1,000 mcg Oral Daily   Elmarie Shiley, MD 02/06/2016, 8:53 AM

## 2016-02-06 NOTE — Clinical Social Work Note (Signed)
CSW consult acknowledged:  Clinical Social Worker received consult in regards to patient managing her medications at home. Please consult RNCM for medication assistance and further assessment of home needs.   Clinical Social Worker will sign off for now as social work intervention is no longer needed. Please consult [place] again if new need arises.  Glendon Axe, MSW, LCSWA [phone] 02/06/2016 11:49 AM

## 2016-02-06 NOTE — Progress Notes (Signed)
ANTICOAGULATION CONSULT NOTE - Follow Up Consult  Pharmacy Consult for heparin Indication: atrial fibrillation  Labs:  Recent Labs  02/03/16 2246 02/03/16 2323  02/04/16 0211 02/04/16 0426 02/04/16 0815 02/04/16 1103 02/04/16 1602 02/05/16 0330 02/05/16 1053 02/05/16 1932 02/06/16 0210  HGB 8.8* 9.9*  --  8.2*  --   --   --   --   --   --   --  9.1*  HCT 28.0* 29.0*  --  26.7*  --   --   --   --   --   --   --  28.7*  PLT 296  --   --  269  --   --   --   --   --   --   --  241  APTT  --   --   --   --   --   --   --   --   --  30 <20* 40*  LABPROT  --   --   --   --   --   --   --   --   --  17.4*  --   --   INR  --   --   --   --   --   --   --   --   --  1.42  --   --   HEPARINUNFRC  --   --   --   --   --   --   --   --   --  0.79*  --  0.43  CREATININE 3.66* 3.30*  --  3.60*  --   --   --  4.21* 3.78*  --   --  1.62*  TROPONINI  --   --   < > 0.04* 0.03 0.03 0.03  --   --   --   --   --   < > = values in this interval not displayed.   Assessment: 55yo female remains subtherapeutic on heparin after rate increase (using PTT until heparin levels correlate).  Goal of Therapy:  Heparin level 0.3-0.7 units/ml   Plan:  Will increase heparin gtt by 3 units/kg/hr to 1500 units/hr and check PTT in 6hr.  Wynona Neat, PharmD, BCPS  02/06/2016,3:55 AM

## 2016-02-07 LAB — BASIC METABOLIC PANEL
Anion gap: 7 (ref 5–15)
BUN: 26 mg/dL — AB (ref 6–20)
CHLORIDE: 102 mmol/L (ref 101–111)
CO2: 24 mmol/L (ref 22–32)
CREATININE: 1.19 mg/dL — AB (ref 0.44–1.00)
Calcium: 8.7 mg/dL — ABNORMAL LOW (ref 8.9–10.3)
GFR calc non Af Amer: 51 mL/min — ABNORMAL LOW (ref >60)
GFR, EST AFRICAN AMERICAN: 59 mL/min — AB (ref >60)
Glucose, Bld: 362 mg/dL — ABNORMAL HIGH (ref 65–99)
POTASSIUM: 4.7 mmol/L (ref 3.5–5.1)
SODIUM: 133 mmol/L — AB (ref 135–145)

## 2016-02-07 LAB — CBC
HCT: 28.7 % — ABNORMAL LOW (ref 36.0–46.0)
Hemoglobin: 8.9 g/dL — ABNORMAL LOW (ref 12.0–15.0)
MCH: 27.3 pg (ref 26.0–34.0)
MCHC: 31 g/dL (ref 30.0–36.0)
MCV: 88 fL (ref 78.0–100.0)
Platelets: 238 10*3/uL (ref 150–400)
RBC: 3.26 MIL/uL — ABNORMAL LOW (ref 3.87–5.11)
RDW: 14.4 % (ref 11.5–15.5)
WBC: 5.4 10*3/uL (ref 4.0–10.5)

## 2016-02-07 LAB — HEPARIN LEVEL (UNFRACTIONATED): HEPARIN UNFRACTIONATED: 0.76 [IU]/mL — AB (ref 0.30–0.70)

## 2016-02-07 LAB — APTT: aPTT: 82 seconds — ABNORMAL HIGH (ref 24–37)

## 2016-02-07 LAB — PROTIME-INR
INR: 1.17 (ref 0.00–1.49)
PROTHROMBIN TIME: 15 s (ref 11.6–15.2)

## 2016-02-07 LAB — GLUCOSE, CAPILLARY
GLUCOSE-CAPILLARY: 420 mg/dL — AB (ref 65–99)
GLUCOSE-CAPILLARY: 198 mg/dL — AB (ref 65–99)
GLUCOSE-CAPILLARY: 296 mg/dL — AB (ref 65–99)
Glucose-Capillary: 176 mg/dL — ABNORMAL HIGH (ref 65–99)

## 2016-02-07 MED ORDER — HYDROXYZINE HCL 10 MG PO TABS
10.0000 mg | ORAL_TABLET | Freq: Once | ORAL | Status: AC
  Administered 2016-02-07: 10 mg via ORAL
  Filled 2016-02-07: qty 1

## 2016-02-07 MED ORDER — AMIODARONE HCL 100 MG PO TABS
100.0000 mg | ORAL_TABLET | Freq: Every day | ORAL | Status: DC
  Administered 2016-02-07 – 2016-02-08 (×2): 100 mg via ORAL
  Filled 2016-02-07 (×2): qty 1

## 2016-02-07 MED ORDER — WARFARIN SODIUM 10 MG PO TABS
10.0000 mg | ORAL_TABLET | Freq: Once | ORAL | Status: AC
  Administered 2016-02-07: 10 mg via ORAL
  Filled 2016-02-07: qty 1

## 2016-02-07 MED ORDER — INSULIN ASPART 100 UNIT/ML ~~LOC~~ SOLN
20.0000 [IU] | Freq: Once | SUBCUTANEOUS | Status: AC
  Administered 2016-02-07: 20 [IU] via SUBCUTANEOUS

## 2016-02-07 MED ORDER — TORSEMIDE 20 MG PO TABS
60.0000 mg | ORAL_TABLET | Freq: Every day | ORAL | Status: DC
  Administered 2016-02-07 – 2016-02-08 (×2): 60 mg via ORAL
  Filled 2016-02-07 (×2): qty 3

## 2016-02-07 MED ORDER — HYDROXYZINE HCL 10 MG PO TABS
10.0000 mg | ORAL_TABLET | Freq: Once | ORAL | Status: AC
Start: 2016-02-07 — End: 2016-02-07
  Administered 2016-02-07: 10 mg via ORAL
  Filled 2016-02-07: qty 1

## 2016-02-07 MED ORDER — INSULIN ASPART 100 UNIT/ML ~~LOC~~ SOLN
0.0000 [IU] | Freq: Three times a day (TID) | SUBCUTANEOUS | Status: DC
Start: 2016-02-07 — End: 2016-02-08
  Administered 2016-02-07 – 2016-02-08 (×3): 4 [IU] via SUBCUTANEOUS
  Administered 2016-02-08: 20 [IU] via SUBCUTANEOUS

## 2016-02-07 NOTE — Progress Notes (Signed)
Patient ID: [name], female   DOB: [birth year], 55 y.o.   MRN: [id number]   PATIENT ID: [name] is a 60F with diabetes, hypothyroidism, COPD, polysubstance abuse, coronary artery disease status post non-ST elevation MI, and cirrhosis here with recurrent symptomatic bradycardia.  INTERVAL HISTORY: Heart rate improved in 80's    SUBJECTIVE:  Needs to go to bathroom  No dizziness or pre syncope    PHYSICAL EXAM Filed Vitals:   02/06/16 2157 02/07/16 0255 02/07/16 0455 02/07/16 0843  BP: 120/64  165/69 131/67  Pulse: 82  88   Temp: 98.4 F (36.9 C)  98.3 F (36.8 C)   TempSrc: Oral     Resp: 16  18   Height:      Weight:  109.816 kg (242 lb 1.6 oz)    SpO2: 96%  98%    General:  Chronically ill-appearing. No acute distress. Neck: No JVD Lungs:  Clear to auscultation bilaterally. No crackles, no rhonchi, or wheezes. Heart:  Bradycardic, regular rhythm. No murmurs, rubs, or gallops. Normal S1/S2. Abdomen:  Soft. Nontender, nondistended. Active bowel sounds. Obese. Extremities:  Warm and well-perfused. No edema  LABS: Lab Results  Component Value Date   TROPONINI 0.03 02/04/2016   Results for orders placed or performed during the hospital encounter of 02/03/16 (from the past 24 hour(s))  APTT     Status: Abnormal   Collection Time: 02/06/16  9:33 AM  Result Value Ref Range   aPTT 47 (H) 24 - 37 seconds  Glucose, capillary     Status: Abnormal   Collection Time: 02/06/16 11:39 AM  Result Value Ref Range   Glucose-Capillary 231 (H) 65 - 99 mg/dL   Comment 1 Venous Specimen   APTT     Status: Abnormal   Collection Time: 02/06/16  2:30 PM  Result Value Ref Range   aPTT 74 (H) 24 - 37 seconds  Glucose, capillary     Status: Abnormal   Collection Time: 02/06/16  4:40 PM  Result Value Ref Range   Glucose-Capillary 266 (H) 65 - 99 mg/dL  APTT     Status: Abnormal   Collection Time: 02/06/16  8:12 PM  Result Value Ref Range   aPTT 92 (H) 24 - 37 seconds  Glucose,  capillary     Status: Abnormal   Collection Time: 02/06/16  9:13 PM  Result Value Ref Range   Glucose-Capillary 284 (H) 65 - 99 mg/dL  Glucose, capillary     Status: Abnormal   Collection Time: 02/06/16  9:59 PM  Result Value Ref Range   Glucose-Capillary 255 (H) 65 - 99 mg/dL  Glucose, capillary     Status: Abnormal   Collection Time: 02/06/16 11:14 PM  Result Value Ref Range   Glucose-Capillary 213 (H) 65 - 99 mg/dL  Heparin level (unfractionated)     Status: Abnormal   Collection Time: 02/07/16  4:30 AM  Result Value Ref Range   Heparin Unfractionated 0.76 (H) 0.30 - 0.70 IU/mL  CBC     Status: Abnormal   Collection Time: 02/07/16  4:30 AM  Result Value Ref Range   WBC 5.4 4.0 - 10.5 K/uL   RBC 3.26 (L) 3.87 - 5.11 MIL/uL   Hemoglobin 8.9 (L) 12.0 - 15.0 g/dL   HCT 28.7 (L) 36.0 - 46.0 %   MCV 88.0 78.0 - 100.0 fL   MCH 27.3 26.0 - 34.0 pg   MCHC 31.0 30.0 - 36.0 g/dL   RDW 14.4 11.5 -  15.5 %   Platelets 238 150 - 400 K/uL  Protime-INR     Status: None   Collection Time: 02/07/16  4:30 AM  Result Value Ref Range   Prothrombin Time 15.0 11.6 - 15.2 seconds   INR 1.17 0.00 - 1.49  APTT     Status: Abnormal   Collection Time: 02/07/16  4:30 AM  Result Value Ref Range   aPTT 82 (H) 24 - 37 seconds  Glucose, capillary     Status: Abnormal   Collection Time: 02/07/16  7:47 AM  Result Value Ref Range   Glucose-Capillary 176 (H) 65 - 99 mg/dL    Intake/Output Summary (Last 24 hours) at 02/07/16 0848 Last data filed at 02/07/16 [id number]  Gross per 24 hour  Intake 1745.7 ml  Output   2000 ml  Net -254.3 ml    Telemetry: SR rates 70-90 02/07/2016   Lab Results  Component Value Date   CREATININE 1.62* 02/06/2016   BUN 47* 02/06/2016   NA 137 02/06/2016   K 4.7 02/06/2016   CL 102 02/06/2016   CO2 27 02/06/2016    ASSESSMENT AND PLAN:  Active Problems:   DM (diabetes mellitus), type 2 with peripheral vascular complications ([place])   Long term current use of  anticoagulant therapy   DM type 2, uncontrolled, with neuropathy ([name])   Symptomatic bradycardia   Bradycardia   Hyperkalemia   # SSS, Atrial fibrillation: See consult note EP no pacer Avoid multiple AV nodal drugs.  Cannot be on NOAC given renal failure started on coumadin Continue heparin until INR Rx Currently HR much improved  CHADVASC Score is 5  Will resume home dose of amiodarone   # AKI:  Improved follow diuretic on hold    # DM Type 2: Glucose has been poorly controlled. Management her diabetes coordinator.  Appreciate their assistance.     Jenkins Rouge

## 2016-02-07 NOTE — Progress Notes (Signed)
Patient c/o itching all over again this am. Patient now with welps to her lt thigh and flank. Dr Clayborne Artist notified and orders received. Will medicate the patient and continue to monitor.

## 2016-02-07 NOTE — Progress Notes (Signed)
Patient ID: [name], female   DOB: Jul 24, 1961, 55 y.o.   MRN: [id number]  [facility] Progress Note   Assessment/ Plan:   1. Acute renal failure on chronic kidney disease stage III: Likely suffered ischemic renal injury with symptomatic bradycardia in the face of ongoing ACE inhibitor/diuretic use +/- inadvertent over-diuresis. Renal function continues to show improvement off of intravenous fluids likely supported by resolution of her bradycardia. Will restart lower dose of torsemide today to help with her volume excess.  2. Hyponatremia: This appears to be in part from pseudohyponatremia from concomitant hyperglycemia as well as her overall volume status-start loop diuretic.  3. Anemia: Status post intravenous iron therapy for low iron saturation.  4. Symptomatic Bradycardia: Resolved and felt to be secondary to AV nodal blocking agents-adjustment of therapy per cardiology recommendations .  With renal function back at baseline-will sign off, please reconsult if necessary  Subjective:    Reports that she continues to feel well without any further dizziness/weakness. Denies chest pain or shortness of breath and had some hypoglycemia this morning.     Objective:   BP 131/67 mmHg  Pulse 88  Temp(Src) 98.3 F (36.8 C) (Oral)  Resp 18  Ht 5\' 4"  (1.626 m)  Wt 109.816 kg (242 lb 1.6 oz)  BMI 41.54 kg/m2  SpO2 98%  LMP 11/27/2012  Intake/Output Summary (Last 24 hours) at 02/07/16 1159 Last data filed at 02/07/16 0843  Gross per 24 hour  Intake 1653.2 ml  Output   1500 ml  Net  153.2 ml   Weight change: 1.016 kg (2 lb 3.8 oz)  Physical Exam: Gen: Comfortably sitting up in recliner watching television CVS: Pulse regular tachycardia S1 and S2 normal Resp: Decreased breath sounds over bases-no rales Abd: Soft, obese, nontender Ext: 1+ lower extremity edema  Imaging: No results found.  Labs: BMET  Recent Labs Lab 02/03/16 2246 02/03/16 2323 02/04/16 0211  02/04/16 1602 02/05/16 0330 02/06/16 0210 02/07/16 0950  NA 133* 133* 134* 137 132* 137 133*  K 5.7* 5.7* 6.0* 4.9 4.9 4.7 4.7  CL 94* 94* 96* 99* 96* 102 102  CO2 28  --  26 26 25 27 24   GLUCOSE 300* 293* 325* 161* 341* 176* 362*  BUN 67* 74* 71* 83* 86* 47* 26*  CREATININE 3.66* 3.30* 3.60* 4.21* 3.78* 1.62* 1.19*  CALCIUM 8.6*  --  8.8* 8.8* 8.3* 8.9 8.7*  PHOS  --   --   --   --  6.5* 4.2  --    CBC  Recent Labs Lab 02/03/16 2246 02/03/16 2323 02/04/16 0211 02/06/16 0210 02/07/16 0430  WBC 9.2  --  10.7* 8.0 5.4  NEUTROABS 5.7  --   --   --   --   HGB 8.8* 9.9* 8.2* 9.1* 8.9*  HCT 28.0* 29.0* 26.7* 28.7* 28.7*  MCV 87.0  --  86.4 87.5 88.0  PLT 296  --  269 241 238    Medications:    . amiodarone  100 mg Oral Daily  . amLODipine  5 mg Oral Daily  . atorvastatin  40 mg Oral q1800  . ferumoxytol  510 mg Intravenous Weekly  . folic acid  1 mg Oral Daily  . insulin aspart  0-15 Units Subcutaneous TID WC  . insulin glargine  70 Units Subcutaneous QHS  . levothyroxine  50 mcg Oral QAC breakfast  . mometasone-formoterol  2 puff Inhalation BID  . pantoprazole  40 mg Oral Daily  .  sodium chloride flush  3 mL Intravenous Q12H  . tiotropium  18 mcg Inhalation Daily  . vitamin B-12  1,000 mcg Oral Daily  . warfarin  10 mg Oral ONCE-1800  . Warfarin - Pharmacist Dosing Inpatient   Does not apply [id number]   Elmarie Shiley, MD 02/07/2016, 11:59 AM

## 2016-02-07 NOTE — Progress Notes (Signed)
Patient c/o itching all over. No overt rash noted. Patient with a bump on back of both thighs. Skin is very dry. Attempted to soothe itching with lotion. Patient continues to complain. Dr Clayborne Artist called and made aware and order for 1 time dose of benadryl given. Will medicate and continue to monitor.

## 2016-02-07 NOTE — Progress Notes (Signed)
ANTICOAGULATION CONSULT NOTE - Follow Up Consult  Pharmacy Consult for Heparin/warfarin Indication: Atrial fibrillation  No Known Allergies  Patient Measurements: Height: 5\' 4"  (162.6 cm) Weight: 242 lb 1.6 oz (109.816 kg) IBW/kg (Calculated) : 54.7 Heparin Dosing Weight: 84 kg  Vital Signs: Temp: 98.3 F (36.8 C) (03/26 0455) Temp Source: Oral (03/25 2157) BP: 131/67 mmHg (03/26 0843) Pulse Rate: 88 (03/26 0455)  Labs:  Recent Labs  02/04/16 1103 02/04/16 1602 02/05/16 0330 02/05/16 1053  02/06/16 0210  02/06/16 1430 02/06/16 2012 02/07/16 0430  HGB  --   --   --   --   --  9.1*  --   --   --  8.9*  HCT  --   --   --   --   --  28.7*  --   --   --  28.7*  PLT  --   --   --   --   --  241  --   --   --  238  APTT  --   --   --  30  < > 40*  < > 74* 92* 82*  LABPROT  --   --   --  17.4*  --   --   --   --   --  15.0  INR  --   --   --  1.42  --   --   --   --   --  1.17  HEPARINUNFRC  --   --   --  0.79*  --  0.43  --   --   --  0.76*  CREATININE  --  4.21* 3.78*  --   --  1.62*  --   --   --   --   TROPONINI 0.03  --   --   --   --   --   --   --   --   --   < > = values in this interval not displayed.  Estimated Creatinine Clearance: 48.6 mL/min (by C-G formula based on Cr of 1.62).   Assessment: 89 yof on Xarelto PTA for afib, which has been on hold d/t AKI. She is currently on IV heparin which is being dosed based on aPTT due to false elevation of HL with DOACs. Switched from Xarelto to warfarin due to decline in renal function. Currently bridging to warfarin with heparin. APTT 82 (therapeutic) with heparin drip at 1800 units/hr.  INR 1.17 (subtherapeutic) No bleeding noted. CBC stable   Goal of Therapy:  aPTT 66-102 seconds Heparin level 0.3-0.7 units/ml  Monitor platelets by anticoagulation protocol: Yes   Plan:  Continue heparin at 1800 units/hr. Warfarin 10mg  tonight Daily aPTT/HL, CBC, INR.    Thank you for allowing pharmacy to be part of this  patients care team. Darl Pikes, PharmD Clinical Pharmacist- Resident Pager: [phone]   02/07/2016,8:46 AM

## 2016-02-08 ENCOUNTER — Other Ambulatory Visit: Payer: Self-pay | Admitting: Physician Assistant

## 2016-02-08 ENCOUNTER — Encounter (HOSPITAL_COMMUNITY): Payer: Self-pay | Admitting: Physician Assistant

## 2016-02-08 DIAGNOSIS — E1165 Type 2 diabetes mellitus with hyperglycemia: Secondary | ICD-10-CM

## 2016-02-08 DIAGNOSIS — I480 Paroxysmal atrial fibrillation: Secondary | ICD-10-CM

## 2016-02-08 DIAGNOSIS — E1140 Type 2 diabetes mellitus with diabetic neuropathy, unspecified: Secondary | ICD-10-CM

## 2016-02-08 LAB — PROTIME-INR
INR: 1.19 (ref 0.00–1.49)
PROTHROMBIN TIME: 15.3 s — AB (ref 11.6–15.2)

## 2016-02-08 LAB — CBC
HEMATOCRIT: 28.9 % — AB (ref 36.0–46.0)
HEMOGLOBIN: 8.8 g/dL — AB (ref 12.0–15.0)
MCH: 26.5 pg (ref 26.0–34.0)
MCHC: 30.4 g/dL (ref 30.0–36.0)
MCV: 87 fL (ref 78.0–100.0)
Platelets: 285 10*3/uL (ref 150–400)
RBC: 3.32 MIL/uL — AB (ref 3.87–5.11)
RDW: 14.5 % (ref 11.5–15.5)
WBC: 8.2 10*3/uL (ref 4.0–10.5)

## 2016-02-08 LAB — GLUCOSE, CAPILLARY
Glucose-Capillary: 199 mg/dL — ABNORMAL HIGH (ref 65–99)
Glucose-Capillary: 383 mg/dL — ABNORMAL HIGH (ref 65–99)
Glucose-Capillary: 180 mg/dL — ABNORMAL HIGH (ref 65–99)

## 2016-02-08 LAB — RENAL FUNCTION PANEL
ANION GAP: 10 (ref 5–15)
Albumin: 2.7 g/dL — ABNORMAL LOW (ref 3.5–5.0)
BUN: 29 mg/dL — ABNORMAL HIGH (ref 6–20)
CALCIUM: 8.8 mg/dL — AB (ref 8.9–10.3)
CHLORIDE: 99 mmol/L — AB (ref 101–111)
CO2: 26 mmol/L (ref 22–32)
Creatinine, Ser: 1.45 mg/dL — ABNORMAL HIGH (ref 0.44–1.00)
GFR calc non Af Amer: 40 mL/min — ABNORMAL LOW (ref >60)
GFR, EST AFRICAN AMERICAN: 47 mL/min — AB (ref >60)
Glucose, Bld: 219 mg/dL — ABNORMAL HIGH (ref 65–99)
Phosphorus: 3.6 mg/dL (ref 2.5–4.6)
Potassium: 4.2 mmol/L (ref 3.5–5.1)
SODIUM: 135 mmol/L (ref 135–145)

## 2016-02-08 LAB — APTT: APTT: 79 s — AB (ref 24–37)

## 2016-02-08 LAB — HEPARIN LEVEL (UNFRACTIONATED): Heparin Unfractionated: 0.69 IU/mL (ref 0.30–0.70)

## 2016-02-08 MED ORDER — ENOXAPARIN SODIUM 100 MG/ML ~~LOC~~ SOLN
100.0000 mg | Freq: Once | SUBCUTANEOUS | Status: AC
  Administered 2016-02-08: 100 mg via SUBCUTANEOUS
  Filled 2016-02-08: qty 1

## 2016-02-08 MED ORDER — WARFARIN SODIUM 5 MG PO TABS: 7.5000 mg | ORAL_TABLET | Freq: Every day | ORAL | Status: DC

## 2016-02-08 MED ORDER — ENOXAPARIN SODIUM 100 MG/ML ~~LOC~~ SOLN: 100.0000 mg | Freq: Two times a day (BID) | SUBCUTANEOUS | Status: DC

## 2016-02-08 MED ORDER — POTASSIUM CHLORIDE CRYS ER 20 MEQ PO TBCR: 20.0000 meq | EXTENDED_RELEASE_TABLET | Freq: Every day | ORAL | Status: DC

## 2016-02-08 MED ORDER — ENOXAPARIN SODIUM 100 MG/ML ~~LOC~~ SOLN: 100.0000 mg | Freq: Once | SUBCUTANEOUS | Status: DC

## 2016-02-08 MED ORDER — AMLODIPINE BESYLATE 5 MG PO TABS: 5.0000 mg | ORAL_TABLET | Freq: Every day | ORAL | Status: DC

## 2016-02-08 MED ORDER — TORSEMIDE 20 MG PO TABS: 60.0000 mg | ORAL_TABLET | Freq: Once | ORAL | Status: DC

## 2016-02-08 MED FILL — ENOXAPARIN 100 MG/ML SYRN: 100 | 4 days supply | Qty: 8 | Fill #0

## 2016-02-08 NOTE — Care Management Note (Addendum)
Case Management Note  Patient Details  Name: [name] MRN: [id number] Date of Birth: 04/06/1961  Subjective/Objective: Pt admitted for bradycardia. Plan for home on Lovenox injections.                     Action/Plan: Pt states she uses a delivery pharmacy out of [place]. CM did call [facility]. Insurance with [employer] is active on file for generic lovenox. Delivery Pharmacy does not have a driver in the area until Wed. Pt does not have insurance card with her. CM did call the outpatient pharmacy to see if they could get the Rx switched. [facility] via [facility]. They were waiting on [facility] to fax Rx. CM did a benefits check to see if insurance will pay for medication. No further needs at this time.     Expected Discharge Date:                  Expected Discharge Plan:  [place]  In-House Referral:  NA  Discharge planning Services  Medication Assistance, CM Consult  Post Acute Care Choice:  Resumption of Svcs/PTA Provider, Home Health Choice offered to:  Patient  DME Arranged:  N/A DME Agency:  NA  HH Arranged:  RN, Social Work, PT [place] Agency:  [place]  Status of Service:  Completed, signed off  Medicare Important Message Given:  Yes Date Medicare IM Given:    Medicare IM give by:    Date Additional Medicare IM Given:    Additional Medicare Important Message give by:     If discussed at [place] of Stay Meetings, dates discussed:    Additional Comments: Kai Levins, RN            S/W [facility] @ [facility] # [phone]   LOVENOX 100 MG SQ BID -- NO T ON FORMULARY   ENOXAPRIN 100 MG SQ BID DAILY FOR 5 DAYS 10 SYRINGES   COVER- YES  CO-PAY- $ 3.30  TIER- 4 DRUG  PRIOR APPROVAL - NO  PHARMACY : [place]    CM did call the [facility] back to speak with Beverly Gust. Benefits check completed and co pay will be $3.30. RN  to call PA to have Rx sent over to outpatient pharmacy. No further needs at this time.   Karla Roys, RN 02/08/2016, 4:39 PM

## 2016-02-08 NOTE — Discharge Summary (Signed)
Discharge Summary    Patient ID: [name],  MRN: [id number], DOB/AGE: 1961-03-12 55 y.o.  Admit date: 02/03/2016 Discharge date: 02/08/2016  Primary Care Provider: [facility],OVP Primary Cardiologist: Dr. Gwenlyn Found  Discharge Diagnoses    Principal Problem:   Symptomatic bradycardia Active Problems:   DM (diabetes mellitus), type 2 with peripheral vascular complications ([place])   Long term current use of anticoagulant therapy   DM type 2, uncontrolled, with neuropathy ([name])   Bradycardia   Hyperkalemia   PAF   Chronic combined CHF   HTN   HLD   CAD  Allergies No Known Allergies  Diagnostic Studies/Procedures    None  History of Present Illness     [name] is a 55 y.o. female with an extensive pmhx which includes pAF, combined chronic CHF, cirrhosis, GERD, HTN, HLD, CAD, PAD and diabetes, brought in by ambulance, who presented 02/04/16 to the ED complaining of sudden onset, constant, waxing and waning dizziness, and light headedness along with increasing shakiness x 1 day. She reports some similar symptoms in the the past but cannot tell what the etiology was at the time. Sx are exacerbated by sitting and standing. Pt states that she felt a sudden onset of dizziness earlier today that caused her to drop everything in her hands. She tells that she is taking her medications but is unable to tell what they are, however, she has all of them in her purse. Of note, she has amiodarone, bisoprolol and diltiazem. Reviewing the chart she was discharged earlier this month following an admission for bradycardia with transvenous temp pacer placement with instructions to continue amiodarone and bisoprolol. However, upon cardiology f/u there is reference to medications not matching med list and that she remained on amiodarone and CCB. Asked her specifically about these and she appears to be taking all 3.  In ED her vitals showed bradycardia with HR 36 confirmed by ECG. Atropine given in  ER without significant improvement. Labs significant for upper normal lactate 1.65 but pre-renal AKI with BUN/Cr 67/3.6, mild hyperkalemia 5.7, HCO3 28, Glu 300, stable Hg 8.8 and negative troponin.  Hospital Course     Consultants: EP and nephrology   1. Symptomatic bradycardia: Junctional bradycardia on arrival 36bpm. Discontinued AVN blocking agents (amio, dilt and bisoprolol). The patient was seen by Dr. Rayann Heman who resumed amiodarone along at 138m daily and agreed to discontinued dilt and bisoprolol. The patient's symptoms improved. No further bradycardia. EP felt no indication of PPM at this time.   2. Paroxysmal AF: CHA2DS2Vasc 5. Discontinued AVN blocking agents as per above bradycardia. Discontinued  Xarelto due to AKI with GFR < 15. Bridged heparin to coumadin. Dr. NJohnsie Cancelfelt that she cannot be on NOAC given renal failure started on coumadin .Will discharge on coumadin and defer restart on Xarelto per primary cardiologist.  Continue amiodarone.   3. Acute renal failure on chronic kidney disease stage III: Followed by nephology - suffered ischemic renal injury with symptomatic bradycardia in the face of ongoing ACE inhibitor/diuretic use +/- inadvertent over-diuresis. Renal function continues to show improvement off of intravenous fluids likely supported by resolution of her bradycardia. Restarted lower dose of torsemide with stable renal function.   4. Hyperkalemia: Secondary to acute renal failure with ongoing ACE inhibitor/potassium supplement, this is further compounded by her hyperglycemia-insulin management   5. Hyponatremia: This appears to be in part from pseudohyponatremia from concomitant hyperglycemia as well as her overall volume status- improved on loop diuretic.  The patient will be discharge on coumadin 7.5gm tonight 3/27 and tomorrow 3/28 with Lovenox bridge (Given scrip of Lovenox 156m q 12 hrs #8 RF 0 - had written) per pharmacy. INR check Wednesday 3/29. BMET during  post hospital visit.   The patient has been seen by Dr. SMarlou Porchtoday and deemed ready for discharge home. All follow-up appointments have been scheduled. Discharge medications are listed below.    Discharge Vitals Blood pressure 140/68, pulse 91, temperature 98.1 F (36.7 C), temperature source Oral, resp. rate 16, height _0  (1.626 m), weight 239 lb 11.2 oz (108.727 kg), last menstrual period 11/27/2012, SpO2 99 %.  Filed Weights   02/06/16 0400 02/07/16 0255 02/08/16 0440  Weight: 239 lb 13.8 oz (108.8 kg) 242 lb 1.6 oz (109.816 kg) 239 lb 11.2 oz (108.727 kg)    Labs & Radiologic Studies     CBC  Recent Labs  02/07/16 0430 02/08/16 0311  WBC 5.4 8.2  HGB 8.9* 8.8*  HCT 28.7* 28.9*  MCV 88.0 87.0  PLT 238 2076  Basic Metabolic Panel  Recent Labs  02/06/16 0210 02/07/16 0950 02/08/16 0311  NA 137 133* 135  K 4.7 4.7 4.2  CL 102 102 99*  CO2 _1 GLUCOSE 176* 362* 219*  BUN 47* 26* 29*  CREATININE 1.62* 1.19* 1.45*  CALCIUM 8.9 8.7* 8.8*  PHOS 4.2  --  3.6   Liver Function Tests  Recent Labs  02/06/16 0210 02/08/16 0311  ALBUMIN 2.8* 2.7*     [place]al  02/04/2016  CLINICAL DATA:  Acute on chronic renal failure. EXAM: RENAL / URINARY TRACT ULTRASOUND COMPLETE COMPARISON:  CT abdomen and pelvis 07/04/2015 FINDINGS: Right Kidney: Length: 13.0 cm. Echogenic parenchyma with mild cortical thinning. 2.2 x 1.6 x 2.0 cm hyperechoic lesion in the lower pole corresponding to the fact containing lesion on prior CT. No hydronephrosis. Left Kidney: Length: 12.4 cm. Increased parenchymal echogenicity. No mass or hydronephrosis visualized. Bladder: Appears normal for degree of bladder distention. IMPRESSION: 1. Echogenic kidneys compatible with medical renal disease. No hydronephrosis. 2. 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 Electronically Signed   By: ALogan BoresM.D.   On: 02/04/2016 23:45   Dg Chest Portable 1 View  02/03/2016  CLINICAL DATA:  Bradycardia tonight.  Shortness of breath. Dizziness and weakness. EXAM: PORTABLE CHEST 1 VIEW COMPARISON:  01/12/2016 FINDINGS: Shallow inspiration with atelectasis in the lung bases. Mild cardiac enlargement. No pulmonary vascular congestion. No focal airspace disease or consolidation. No blunting of costophrenic angles. No pneumothorax. Calcification of the aorta. IMPRESSION: Shallow inspiration with atelectasis in the lung bases. Cardiac enlargement. Electronically Signed   By: WLucienne CapersM.D.   On: 02/03/2016 23:32   Dg Chest Port 1 View  01/12/2016  CLINICAL DATA:  Cardiac arrhythmia with temporary pacemaker insertion. EXAM: PORTABLE CHEST 1 VIEW COMPARISON:  Study obtained earlier in the day FINDINGS: The temporary pacemaker lead is attached to the right ventricle. No pneumothorax. There is no edema or consolidation. Heart is mildly enlarged with pulmonary vascularity within normal limits. No adenopathy. No bone lesions. There are surgical clips in the left neck region. IMPRESSION: Pacemaker lead tip at level of right ventricle. No pneumothorax. Stable cardiac prominence. No edema or consolidation. Electronically Signed   By: WLowella GripIII M.D.   On: 01/12/2016 22:01   Dg Chest Portable 1 View  01/12/2016  CLINICAL DATA:  55year old female with history of bradycardia. Shaking. Chest pain. EXAM: PORTABLE CHEST 1  VIEW COMPARISON:  Chest x-ray 11/27/2015. FINDINGS: Transcutaneous defibrillator pads projecting over the lower left hemithorax. Low lung volumes. No acute consolidative airspace disease. No pleural effusions. No evidence of pulmonary edema. No pneumothorax. Heart size is mildly enlarged (unchanged). The patient is rotated to the right on today's exam, resulting in distortion of the mediastinal contours and reduced diagnostic sensitivity and specificity for mediastinal pathology. Atherosclerotic calcifications in the thoracic aorta. IMPRESSION: 1. Low lung volumes without radiographic evidence of acute  cardiopulmonary disease. 2. Mild cardiomegaly. 3. Atherosclerosis. Electronically Signed   By: Vinnie Langton M.D.   On: 01/12/2016 16:18    Disposition   Pt is being discharged home today in good condition.  Follow-up Plans & Appointments    Follow-up Information    Follow up with [facility]. Go on 02/10/2016.   Specialty:  Cardiology   Why:  _0 :30 for coumadin check with pharmacist   Contact information:   [address], [place] [address] [phone]      Follow up with Lyda Jester, PA-C On 02/15/2016.   Specialties:  Cardiology, Radiology   Why:  _1 :00pm for post hospital   Contact information:   [place] [address] [phone]        Discharge Medications   Current Discharge Medication List    START taking these medications   Details  amLODipine (NORVASC) 5 MG tablet Take 1 tablet (5 mg total) by mouth daily. Qty: 30 tablet, Refills: 6    enoxaparin (LOVENOX) 100 MG/ML injection Inject 1 mL (100 mg total) into the skin once. Qty: 1 Syringe, Refills: 0    warfarin (COUMADIN) 5 MG tablet Take 1.5 tablets (7.5 mg total) by mouth daily. Take 1.5 tablet (total 7.5 mg) tonight and tomorrow 02/09/16 at supper and check INR with pharmacist 02/10/16. Qty: 45 tablet, Refills: 0      CONTINUE these medications which have CHANGED   Details  potassium chloride SA (K-DUR,KLOR-CON) 20 MEQ tablet Take 1 tablet (20 mEq total) by mouth daily. Qty: 30 tablet, Refills: 3    torsemide (DEMADEX) 20 MG tablet Take 3 tablets (60 mg total) by mouth once. Qty: 180 tablet, Refills: 6   Associated Diagnoses: Acute on chronic combined systolic and diastolic congestive heart failure ([name])      CONTINUE these medications which have NOT CHANGED   Details  albuterol (PROVENTIL HFA;VENTOLIN HFA) 108 (90 BASE) MCG/ACT inhaler Inhale 2 puffs into the lungs every 6 (six) hours as needed for wheezing or  shortness of breath. Qty: 1 each, Refills: 3    albuterol (PROVENTIL) (2.5 MG/3ML) 0.083% nebulizer solution Take 3 mLs (2.5 mg total) by nebulization every 6 (six) hours as needed. For wheezing. Qty: 75 mL, Refills: 2    amiodarone (PACERONE) 100 MG tablet Take 1 tablet (100 mg total) by mouth daily. Qty: 30 tablet, Refills: 11    atorvastatin (LIPITOR) 40 MG tablet Take 1 tablet (40 mg total) by mouth daily. Qty: 30 tablet, Refills: 2    budesonide-formoterol (SYMBICORT) 160-4.5 MCG/ACT inhaler Inhale 2 puffs into the lungs 2 (two) times daily. Qty: 1 Inhaler, Refills: 12    Cholecalciferol (VITAMIN D) 2000 UNITS tablet Take 2,000 Units by mouth daily.    folic acid (FOLVITE) 1 MG tablet Take 1 tablet (1 mg total) by mouth daily. Qty: 30 tablet, Refills: 5    glipiZIDE (GLUCOTROL XL) 10 MG 24 hr tablet Take 2 tablets (20 mg  total) by mouth daily with breakfast. Qty: 60 tablet, Refills: 3    insulin aspart (NOVOLOG) 100 UNIT/ML FlexPen Inject 6-14 Units into the skin 3 (three) times daily with meals. Sliding scale Qty: 15 mL, Refills: 11   Associated Diagnoses: Type 2 diabetes mellitus with hyperglycemia ([name])    insulin glargine (LANTUS) 100 unit/mL SOPN Inject 0.7 mLs (70 Units total) into the skin at bedtime. Qty: 15 mL, Refills: 11    levothyroxine (SYNTHROID, LEVOTHROID) 50 MCG tablet Take 1 tablet (50 mcg total) by mouth daily. Qty: 30 tablet, Refills: 2    nitroGLYCERIN (NITROSTAT) 0.4 MG SL tablet Place 1 tablet (0.4 mg total) under the tongue every 5 (five) minutes as needed for chest pain. Qty: 25 tablet, Refills: 3    pantoprazole (PROTONIX) 40 MG tablet Take 1 tablet (40 mg total) by mouth daily. Qty: 30 tablet, Refills: 2    tiotropium (SPIRIVA) 18 MCG inhalation capsule Place 1 capsule (18 mcg total) into inhaler and inhale daily. Qty: 30 capsule, Refills: 12    vitamin B-12 1000 MCG tablet Take 1 tablet (1,000 mcg total) by mouth daily. Qty: 30 tablet,  Refills: 5    Blood Glucose Monitoring Suppl (ACCU-CHEK AVIVA PLUS) W/DEVICE KIT 1 Device by Does not apply route 4 (four) times daily -  before meals and at bedtime. Qty: 1 kit, Refills: 0   Associated Diagnoses: Type 2 diabetes mellitus with hyperglycemia ([name])    Insulin Pen Needle (ULTICARE MICRO PEN NEEDLES) 32G X 4 MM MISC 1 Syringe by Does not apply route 4 (four) times daily - after meals and at bedtime. Qty: 1 each, Refills: 11   Associated Diagnoses: Type 2 diabetes mellitus with hyperglycemia ([name])    Lancets (ACCU-CHEK SOFT TOUCH) lancets Use as instructed Qty: 100 each, Refills: 12   Associated Diagnoses: Type 2 diabetes mellitus with hyperglycemia ([name])    NON FORMULARY Place 2 L into the nose continuous.      STOP taking these medications     bisoprolol (ZEBETA) 5 MG tablet      diltiazem (TIAZAC) 180 MG 24 hr capsule      lisinopril (PRINIVIL,ZESTRIL) 2.5 MG tablet      rivaroxaban (XARELTO) 20 MG TABS tablet           Outstanding Labs/Studies   BMET during post hospital appointment  Duration of Discharge Encounter   Greater than 30 minutes including physician time.  Signed, Bhagat,Bhavinkumar PA-C 02/08/2016, 3:23 PM   Personally seen and examined. Agree with above. Multiple hospital days have been reviewed. Because of her original issue with acute kidney injury/acute renal failure, we will avoid novel oral anticoagulants in the future and utilize Coumadin. Currently she is subtherapeutic and we will bridge with Lovenox. Heart rate seems to be very stable currently. Fluid status also appears to be stable and we will continue with torsemide as prescribed above. Appreciate renal assistance. Currently ambulating well, lungs are clear to auscultation.  Candee Furbish, MD

## 2016-02-08 NOTE — Progress Notes (Signed)
Inpatient Diabetes Program Recommendations  AACE/ADA: New Consensus Statement on Inpatient Glycemic Control (2015)  Target Ranges:  Prepandial:   less than 140 mg/dL      Peak postprandial:   less than 180 mg/dL (1-2 hours)      Critically ill patients:  140 - 180 mg/dL   Review of Glycemic Control Results for [name], [name] (MRN [id number]) as of 02/08/2016 10:01  Ref. Range 02/07/2016 07:47 02/07/2016 11:54 02/07/2016 16:58 02/07/2016 21:41 02/08/2016 07:20  Glucose-Capillary Latest Ref Range: 65-99 mg/dL 176 (H) 420 (H) 198 (H) 296 (H) 199 (H)    Inpatient Diabetes Program Recommendations:  Noted CBGs elevated post meals. Please consider Novolog meal coverage 8 units tid with meals (if eats > 50 %). Per home rec, patient takes 6-14 units tid with meals.  Thank you, Nani Gasser. Arlette Schaad, RN, MSN, CDE Inpatient Glycemic Control Team Team Pager [phone] (8am-5pm) 02/08/2016 10:03 AM

## 2016-02-08 NOTE — Progress Notes (Signed)
Pt complains of itching to bilat legs. States she was given benadryl last pm which "didn't work" and was given "something different".  Upon MAR review pt received a one time dose of Hydroxyzine 10mg .  MD paged with orders received. Jessie Foot, RN

## 2016-02-08 NOTE — Care Management Important Message (Signed)
Important Message  Patient Details  Name: [name] MRN: [id number] Date of Birth: 1961-09-30   Medicare Important Message Given:  Yes    [name] 02/08/2016, 12:55 PM

## 2016-02-08 NOTE — Progress Notes (Signed)
Pt sister number is [id number]

## 2016-02-08 NOTE — Progress Notes (Addendum)
ANTICOAGULATION CONSULT NOTE - Follow Up Consult  Pharmacy Consult for Heparin/warfarin Indication: Atrial fibrillation  No Known Allergies  Patient Measurements: Height: 5\' 4"  (162.6 cm) Weight: 239 lb 11.2 oz (108.727 kg) IBW/kg (Calculated) : 54.7 Heparin Dosing Weight: 84 kg  Vital Signs: Temp: 98.2 F (36.8 C) (03/27 0440) BP: 121/60 mmHg (03/27 0440) Pulse Rate: 88 (03/27 0440)  Labs:  Recent Labs  02/06/16 0210  02/06/16 2012 02/07/16 0430 02/07/16 0950 02/08/16 0311  HGB 9.1*  --   --  8.9*  --  8.8*  HCT 28.7*  --   --  28.7*  --  28.9*  PLT 241  --   --  238  --  285  APTT 40*  < > 92* 82*  --  79*  LABPROT  --   --   --  15.0  --  15.3*  INR  --   --   --  1.17  --  1.19  HEPARINUNFRC 0.43  --   --  0.76*  --  0.69  CREATININE 1.62*  --   --   --  1.19* 1.45*  < > = values in this interval not displayed.  Estimated Creatinine Clearance: 54 mL/min (by C-G formula based on Cr of 1.45).   Assessment: 74 yof on Xarelto PTA for afib, which has been on hold d/t AKI. She is currently on IV heparin which is being dosed based on aPTT due to false elevation of HL with DOACs. Switched from Xarelto to warfarin due to decline in renal function. Currently bridging to warfarin with heparin.  aPTT 79 and HL 0.69 (therapeutic) on heparin drip at 1800 units/hr - aPTT and HL are correlating so will d/c aPTT INR 1.19 (subtherapeutic), no bleeding noted, CBC stable  Goal of Therapy:  aPTT 66-102 seconds Heparin level 0.3-0.7 units/ml  Monitor platelets by anticoagulation protocol: Yes   Plan:  Decrease heparin drip to 1750 units/hr since on upper end of goal range Warfarin 7.5 mg PO tonight Back to baseline SCr - change back to Xarelto?? Daily HL, CBC, INR D/C aPTTs  Thank you for allowing pharmacy to be part of this patients care team.  [facility], Pharm.D., BCPS Clinical Pharmacist Pager: [phone] 02/08/2016 11:14 AM   Addendum: Transitioning to Lovenox  for discharge.   Stop heparin drip 1 hr prior to Lovenox dose Lovenox 100 mg SQ q12h CBC q72h  [facility], Pharm.D., BCPS Clinical Pharmacist Pager: [phone] 02/08/2016 1:48 PM

## 2016-02-08 NOTE — Progress Notes (Signed)
Patient Name: [name] Date of Encounter: 02/08/2016   SUBJECTIVE  Feeling well. No chest pain, sob or palpitations.   CURRENT MEDS . amiodarone  100 mg Oral Daily  . amLODipine  5 mg Oral Daily  . atorvastatin  40 mg Oral q1800  . ferumoxytol  510 mg Intravenous Weekly  . folic acid  1 mg Oral Daily  . insulin aspart  0-20 Units Subcutaneous TID WC  . insulin glargine  70 Units Subcutaneous QHS  . levothyroxine  50 mcg Oral QAC breakfast  . mometasone-formoterol  2 puff Inhalation BID  . pantoprazole  40 mg Oral Daily  . sodium chloride flush  3 mL Intravenous Q12H  . tiotropium  18 mcg Inhalation Daily  . torsemide  60 mg Oral Daily  . vitamin B-12  1,000 mcg Oral Daily  . Warfarin - Pharmacist Dosing Inpatient   Does not apply q1800    OBJECTIVE  Filed Vitals:   02/07/16 2047 02/07/16 2118 02/08/16 0440 02/08/16 0845  BP: 114/85  121/60   Pulse: 95 85 88   Temp: 98.4 F (36.9 C)  98.2 F (36.8 C)   TempSrc:      Resp: 20 16 17    Height:      Weight:   239 lb 11.2 oz (108.727 kg)   SpO2: 100% 99% 97% 95%    Intake/Output Summary (Last 24 hours) at 02/08/16 1206 Last data filed at 02/08/16 1100  Gross per 24 hour  Intake   1302 ml  Output   1950 ml  Net   -648 ml   Filed Weights   02/06/16 0400 02/07/16 0255 02/08/16 0440  Weight: 239 lb 13.8 oz (108.8 kg) 242 lb 1.6 oz (109.816 kg) 239 lb 11.2 oz (108.727 kg)    PHYSICAL EXAM  General: Pleasant, NAD. Neuro: Alert and oriented X 3. Moves all extremities spontaneously. Psych: Normal affect. HEENT:  Normal  Neck: Supple without bruits or JVD. Lungs:  Resp regular and unlabored, CTA. Heart: RRR no s3, s4, or murmurs. Abdomen: Soft, non-tender, non-distended, BS + x 4.  Extremities: No clubbing, cyanosis or edema. DP/PT/Radials 2+ and equal bilaterally.  Accessory Clinical Findings  CBC  Recent Labs  02/07/16 0430 02/08/16 0311  WBC 5.4 8.2  HGB 8.9* 8.8*  HCT 28.7* 28.9*  MCV 88.0  87.0  PLT 238 [id number]   Basic Metabolic Panel  Recent Labs  02/06/16 0210 02/07/16 0950 02/08/16 0311  NA 137 133* 135  K 4.7 4.7 4.2  CL 102 102 99*  CO2 27 24 26   GLUCOSE 176* 362* 219*  BUN 47* 26* 29*  CREATININE 1.62* 1.19* 1.45*  CALCIUM 8.9 8.7* 8.8*  PHOS 4.2  --  3.6   Liver Function Tests  Recent Labs  02/06/16 0210 02/08/16 0311  ALBUMIN 2.8* 2.7*     TELE  Sinus rhythm   Radiology/Studies  US Renal  02/04/2016  CLINICAL DATA:  Acute on chronic renal failure. EXAM: RENAL / URINARY TRACT ULTRASOUND COMPLETE COMPARISON:  CT abdomen and pelvis 07/04/2015 FINDINGS: Right Kidney: Length: 13.0 cm. Echogenic parenchyma with mild cortical thinning. 2.2 x 1.6 x 2.0 cm hyperechoic lesion in the lower pole corresponding to the fact containing lesion on prior CT. No hydronephrosis. Left Kidney: Length: 12.4 cm. Increased parenchymal echogenicity. No mass or hydronephrosis visualized. Bladder: Appears normal for degree of bladder distention. IMPRESSION: 1. Echogenic kidneys compatible with medical renal disease. No hydronephrosis. 2. 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 Electronically Signed  By: Logan Bores M.D.   On: 02/04/2016 23:45   Dg Chest Portable 1 View  02/03/2016  CLINICAL DATA:  Bradycardia tonight. Shortness of breath. Dizziness and weakness. EXAM: PORTABLE CHEST 1 VIEW COMPARISON:  01/12/2016 FINDINGS: Shallow inspiration with atelectasis in the lung bases. Mild cardiac enlargement. No pulmonary vascular congestion. No focal airspace disease or consolidation. No blunting of costophrenic angles. No pneumothorax. Calcification of the aorta. IMPRESSION: Shallow inspiration with atelectasis in the lung bases. Cardiac enlargement. Electronically Signed   By: Lucienne Capers M.D.   On: 02/03/2016 23:32   Dg Chest Port 1 View  01/12/2016  CLINICAL DATA:  Cardiac arrhythmia with temporary pacemaker insertion. EXAM: PORTABLE CHEST 1 VIEW COMPARISON:  Study obtained earlier  in the day FINDINGS: The temporary pacemaker lead is attached to the right ventricle. No pneumothorax. There is no edema or consolidation. Heart is mildly enlarged with pulmonary vascularity within normal limits. No adenopathy. No bone lesions. There are surgical clips in the left neck region. IMPRESSION: Pacemaker lead tip at level of right ventricle. No pneumothorax. Stable cardiac prominence. No edema or consolidation. Electronically Signed   By: Lowella Grip III M.D.   On: 01/12/2016 22:01   Dg Chest Portable 1 View  01/12/2016  CLINICAL DATA:  55 year old female with history of bradycardia. Shaking. Chest pain. EXAM: PORTABLE CHEST 1 VIEW COMPARISON:  Chest x-ray 11/27/2015. FINDINGS: Transcutaneous defibrillator pads projecting over the lower left hemithorax. Low lung volumes. No acute consolidative airspace disease. No pleural effusions. No evidence of pulmonary edema. No pneumothorax. Heart size is mildly enlarged (unchanged). The patient is rotated to the right on today's exam, resulting in distortion of the mediastinal contours and reduced diagnostic sensitivity and specificity for mediastinal pathology. Atherosclerotic calcifications in the thoracic aorta. IMPRESSION: 1. Low lung volumes without radiographic evidence of acute cardiopulmonary disease. 2. Mild cardiomegaly. 3. Atherosclerosis. Electronically Signed   By: Vinnie Langton M.D.   On: 01/12/2016 16:18    ASSESSMENT AND PLAN   # SSS, Atrial fibrillation:  EP recommended to avoid multiple AV nodal drugs. No need for pacer. Cannot be on NOAC given renal failure started on coumadin . Continue heparin for now. Continue amiodarone.   # AKI: Improved follow diuretic on hold. Nephrology recommended low dose torsemide. Stable Scr.   # DM Type 2: Glucose has been poorly controlled. Management her diabetes coordinator. Appreciate their assistance.       Signed, Leanor Kail PA-C Pager [phone]  Personally seen and  examined. Agree with above. Should be OK with DC Lovenox bridge Torsemide CTAB  Candee Furbish, MD

## 2016-02-08 NOTE — Progress Notes (Signed)
RN received call from pts sister. Pt states she thought she had coumadin at home. Upon arrival at home pt does not have any coumadin. RN paged PA on call to have prescription sent in to [facility] on [address].

## 2016-02-08 NOTE — Progress Notes (Signed)
Pt discharged home. Discharge instructions have been gone over with the patient. IV's removed. Pt given unit number and told to call if they have any concerns regarding their discharge instructions. Teela Narducci V, RN   

## 2016-02-09 ENCOUNTER — Telehealth: Payer: Self-pay | Admitting: *Deleted

## 2016-02-09 MED ORDER — WARFARIN SODIUM 5 MG PO TABS: ORAL_TABLET | ORAL | Status: DC

## 2016-02-09 NOTE — Telephone Encounter (Signed)
Patients sister, Dalene Seltzer called and stated that the patient was discharged from the hospital yesterday and an rx for warfarin was supposed to be sent to [facility]. Per epic the rx was printed, but patient stated that she does not have it. Dalene Seltzer can be reached at [phone]. Please advise. Thanks, MI

## 2016-02-09 NOTE — Telephone Encounter (Signed)
Rx resent to [facility], attempted to return call to pt's sister to notify.  LMOM TCB.

## 2016-02-10 ENCOUNTER — Ambulatory Visit (INDEPENDENT_AMBULATORY_CARE_PROVIDER_SITE_OTHER): Payer: Medicare Other | Admitting: *Deleted

## 2016-02-10 DIAGNOSIS — I4891 Unspecified atrial fibrillation: Secondary | ICD-10-CM | POA: Diagnosis not present

## 2016-02-10 DIAGNOSIS — I480 Paroxysmal atrial fibrillation: Secondary | ICD-10-CM

## 2016-02-10 DIAGNOSIS — Z5181 Encounter for therapeutic drug level monitoring: Secondary | ICD-10-CM | POA: Diagnosis not present

## 2016-02-10 LAB — POCT INR: INR: 1.2

## 2016-02-11 ENCOUNTER — Encounter (HOSPITAL_COMMUNITY): Payer: Self-pay

## 2016-02-11 ENCOUNTER — Ambulatory Visit (HOSPITAL_COMMUNITY)
Admission: RE | Admit: 2016-02-11 | Discharge: 2016-02-11 | Disposition: A | Discharge status: Home / Self Care | Payer: Medicare Other | Source: Ambulatory Visit | Attending: Cardiology | Admitting: Cardiology

## 2016-02-11 VITALS — BP 158/72 | HR 91 | Wt 236.8 lb

## 2016-02-11 DIAGNOSIS — Z7901 Long term (current) use of anticoagulants: Secondary | ICD-10-CM | POA: Diagnosis not present

## 2016-02-11 DIAGNOSIS — Z79899 Other long term (current) drug therapy: Secondary | ICD-10-CM | POA: Insufficient documentation

## 2016-02-11 DIAGNOSIS — E1122 Type 2 diabetes mellitus with diabetic chronic kidney disease: Secondary | ICD-10-CM | POA: Diagnosis not present

## 2016-02-11 DIAGNOSIS — I130 Hypertensive heart and chronic kidney disease with heart failure and stage 1 through stage 4 chronic kidney disease, or unspecified chronic kidney disease: Secondary | ICD-10-CM | POA: Diagnosis not present

## 2016-02-11 DIAGNOSIS — J449 Chronic obstructive pulmonary disease, unspecified: Secondary | ICD-10-CM | POA: Diagnosis not present

## 2016-02-11 DIAGNOSIS — N189 Chronic kidney disease, unspecified: Secondary | ICD-10-CM | POA: Insufficient documentation

## 2016-02-11 DIAGNOSIS — I2510 Atherosclerotic heart disease of native coronary artery without angina pectoris: Secondary | ICD-10-CM | POA: Diagnosis not present

## 2016-02-11 DIAGNOSIS — E039 Hypothyroidism, unspecified: Secondary | ICD-10-CM | POA: Diagnosis not present

## 2016-02-11 DIAGNOSIS — I5022 Chronic systolic (congestive) heart failure: Secondary | ICD-10-CM | POA: Diagnosis present

## 2016-02-11 DIAGNOSIS — Z9862 Peripheral vascular angioplasty status: Secondary | ICD-10-CM

## 2016-02-11 DIAGNOSIS — I739 Peripheral vascular disease, unspecified: Secondary | ICD-10-CM | POA: Insufficient documentation

## 2016-02-11 DIAGNOSIS — Z8249 Family history of ischemic heart disease and other diseases of the circulatory system: Secondary | ICD-10-CM | POA: Insufficient documentation

## 2016-02-11 DIAGNOSIS — Z794 Long term (current) use of insulin: Secondary | ICD-10-CM | POA: Diagnosis not present

## 2016-02-11 DIAGNOSIS — I5043 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) and diastolic (congestive) heart failure: Secondary | ICD-10-CM | POA: Diagnosis not present

## 2016-02-11 DIAGNOSIS — K7460 Unspecified cirrhosis of liver: Secondary | ICD-10-CM | POA: Diagnosis not present

## 2016-02-11 DIAGNOSIS — Z9981 Dependence on supplemental oxygen: Secondary | ICD-10-CM | POA: Diagnosis not present

## 2016-02-11 DIAGNOSIS — R0683 Snoring: Secondary | ICD-10-CM

## 2016-02-11 DIAGNOSIS — I5023 Acute on chronic systolic (congestive) heart failure: Secondary | ICD-10-CM

## 2016-02-11 DIAGNOSIS — I480 Paroxysmal atrial fibrillation: Secondary | ICD-10-CM | POA: Insufficient documentation

## 2016-02-11 MED ORDER — TORSEMIDE 20 MG PO TABS: 80.0000 mg | ORAL_TABLET | Freq: Once | ORAL | Status: DC

## 2016-02-11 MED ORDER — POTASSIUM CHLORIDE CRYS ER 20 MEQ PO TBCR: 30.0000 meq | EXTENDED_RELEASE_TABLET | Freq: Every day | ORAL | Status: DC

## 2016-02-11 MED ORDER — AMLODIPINE BESYLATE 10 MG PO TABS: 10.0000 mg | ORAL_TABLET | Freq: Every day | ORAL | Status: DC

## 2016-02-11 NOTE — Patient Instructions (Addendum)
INCREASE Amlodipine to 10 mg, one tab daily INCREASE Torsemide to 80 mg (4 tabs) daily iNCREASE Potassium to 30 meQ, one and one half tab daily  Labs needed in one week  Your physician recommends that you schedule a follow-up appointment in: 2 weeks  You have been referred to Paramed[facility]. They will be in contact with you to schedule an appointment.  Your provider has requested you have a sleep study.  Also you have been referred to [facility].

## 2016-02-12 NOTE — Progress Notes (Signed)
Patient ID: [name], female   DOB: 11-11-1961, 55 y.o.   MRN: [id number]    Advanced Heart Failure Clinic Note   PCP: Dr Madelaine Etienne HF Cardiology: Dr. Shirlee Latch PV: Dr. Allyson Sabal  55 yo with history of PAD, carotid stenosis s/p left CEA, relatively mild CAD, chronic systolic CHF, paroxysmal atrial fibrillation and prior substance abuse presents for CHF clinic followup.  She was admitted in 1/17 with acute hypoxemic respiratory failure in the setting of atrial fibrillation/RVR and volume overload.  She was initially intubated.  She converted back to NSR with amiodarone gtt.  She was treated with IV Lasix, steroids, bronchodilators.  She developed AKI and losartan was stopped.    She was admitted in 3/17 with symptomatic bradycardia.  She was supposed to stop diltiazem after this appointment but continued to take it.  She presented later in 3/17, again with symptomatic bradycardia (junctional bradycardia), hypotension, and AKI.  Diltiazem and bisoprolol were stopped.  HR recovered.  ACEI was stopped with AKI.   She now seems to be taking her medications as ordered.  Paramedicine is following her.  She is on home oxygen.  She remains in NSR on low dose amiodarone.  BP is high.  She is able to walk about 3-4 minutes then develops bilateral calf pain.  No foot ulcers.  She denies significant exertional dyspnea but is minimally active, mainly due to claudication.  No chest pain.  No orthopnea/PND.  No lightheadedness.  ECG: NSR with septal Qs  Labs (1/17): K 5.2, creatinine 1.4, AST 43, ALT normal Labs (2/17): K 4.2, creatinine 1.3, BNP 607, TSH normal Labs (3/17): K 4.2, creatinine 1.45, AST/ALT normal, HCT 28.7  PMH: 1. Carotid stenosis: Known occluded right carotid.  Left CEA in 4/16.  2. CAD: LHC in 12/15 with 80% stenosis in small OM1, nonobstructive disease in other territories.  3. Chronic systolic CHF: Nonischemic cardiomyopathy (?due to ETOH or prior drug abuse).  - Echo (1/17) with EF 45%,  mild LV hypertrophy, moderate diastolic dysfunction, inferolateral severe hypokinesis, mildly decreased RV systolic function.  4. Atrial fibrillation: Paroxysmal.   5. Type II diabetes 6. CKD: Stage III. 7. COPD: Quit smoking 1/17. On home oxygen.  8. 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  No longer drinks.  9. Hypothyroidism 10. PAD: Atherectomy SFA in 2014 (Dr Allyson Sabal).  Peripheral arterial dopplers (2/17) with focal 75-99% proximal right SFA stenosis, occluded mid-distal right SFA, chronic occlusion of all runoff arteries on right. 11. Anemia 12. Prior cocaine abuse.  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   SH: Lives with sister.  Prior cocaine abuse.  Prior ETOH abuse.  Quit smoking in 1/17.  FH: CAD  ROS: All systems reviewed and negative except as per HPI.   Current Outpatient Prescriptions  Medication Sig Dispense Refill  . albuterol (PROVENTIL HFA;VENTOLIN HFA) 108 (90 BASE) MCG/ACT inhaler Inhale 2 puffs into the lungs every 6 (six) hours as needed for wheezing or shortness of breath. 1 each 3  . albuterol (PROVENTIL) (2.5 MG/3ML) 0.083% nebulizer solution Take 3 mLs (2.5 mg total) by nebulization every 6 (six) hours as needed. For wheezing. 75 mL 2  . amiodarone (PACERONE) 100 MG tablet Take 1 tablet (100 mg total) by mouth daily. 30 tablet 11  . amLODipine (NORVASC) 10 MG tablet Take 1 tablet (10 mg total) by mouth daily. 30 tablet 6  . atorvastatin (LIPITOR) 40 MG tablet Take 1 tablet (40 mg total) by mouth daily. 30  tablet 2  . Blood Glucose Monitoring Suppl (ACCU-CHEK AVIVA PLUS) W/DEVICE KIT 1 Device by Does not apply route 4 (four) times daily -  before meals and at bedtime. 1 kit 0  . budesonide-formoterol (SYMBICORT) 160-4.5 MCG/ACT inhaler Inhale 2 puffs into the lungs 2 (two) times daily. 1 Inhaler 12  . Cholecalciferol (VITAMIN D) 2000 UNITS tablet Take 2,000 Units by mouth daily. Reported on 02/11/2016    . enoxaparin (LOVENOX) 100 MG/ML  injection Inject 1 mL (100 mg total) into the skin once. 1 Syringe 1  . folic acid (FOLVITE) 1 MG tablet Take 1 tablet (1 mg total) by mouth daily. 30 tablet 5  . gabapentin (NEURONTIN) 300 MG capsule Take 300 mg by mouth 4 (four) times daily.    [name] glipiZIDE (GLUCOTROL XL) 10 MG 24 hr tablet Take 2 tablets (20 mg total) by mouth daily with breakfast. 60 tablet 3  . insulin aspart (NOVOLOG) 100 UNIT/ML FlexPen Inject 6-14 Units into the skin 3 (three) times daily with meals. Sliding scale 15 mL 11  . insulin glargine (LANTUS) 100 unit/mL SOPN Inject 0.7 mLs (70 Units total) into the skin at bedtime. 15 mL 11  . Insulin Pen Needle (ULTICARE MICRO PEN NEEDLES) 32G X 4 MM MISC 1 Syringe by Does not apply route 4 (four) times daily - after meals and at bedtime. 1 each 11  . Lancets (ACCU-CHEK SOFT TOUCH) lancets Use as instructed 100 each 12  . levothyroxine (SYNTHROID, LEVOTHROID) 50 MCG tablet Take 1 tablet (50 mcg total) by mouth daily. 30 tablet 2  . nitroGLYCERIN (NITROSTAT) 0.4 MG SL tablet Place 1 tablet (0.4 mg total) under the tongue every 5 (five) minutes as needed for chest pain. 25 tablet 3  . NON FORMULARY Place 2 L into the nose continuous.    . pantoprazole (PROTONIX) 40 MG tablet Take 1 tablet (40 mg total) by mouth daily. 30 tablet 2  . potassium chloride SA (K-DUR,KLOR-CON) 20 MEQ tablet Take 1.5 tablets (30 mEq total) by mouth daily. 45 tablet 3  . tiotropium (SPIRIVA) 18 MCG inhalation capsule Place 1 capsule (18 mcg total) into inhaler and inhale daily. 30 capsule 12  . torsemide (DEMADEX) 20 MG tablet Take 4 tablets (80 mg total) by mouth once. 180 tablet 6  . vitamin B-12 1000 MCG tablet Take 1 tablet (1,000 mcg total) by mouth daily. 30 tablet 5  . warfarin (COUMADIN) 5 MG tablet Take 1.5 tablet (total 7.5 mg) tonight and tomorrow 02/09/16 at supper and check INR 02/10/16. 45 tablet 0   No current facility-administered medications for this encounter.     Filed Vitals:    02/11/16 1449  BP: 158/72  Pulse: 91  Weight: 236 lb 12.8 oz (107.412 kg)  SpO2: 97%   Wt Readings from Last 3 Encounters:  02/11/16 236 lb 12.8 oz (107.412 kg)  02/08/16 239 lb 11.2 oz (108.727 kg)  01/25/16 239 lb 6.4 oz (108.591 kg)     General: NAD Neck: Thick, JVP 8-9 cm, no thyromegaly or thyroid nodule.  Lungs: Distant breath sounds bilaterally.  CV: Nondisplaced PMI.  Heart regular S1/S2, no S3/S4, no murmur.  2+ edema to knees bilaterally.  No carotid bruit.  Unable to palpate pedal pulses.  Trace right radial pulse, 2+ left radial pulse.  Abdomen: Soft, nontender, no hepatosplenomegaly, no distention.  Skin: Intact without lesions or rashes.  Neurologic: Alert and oriented x 3.  Psych: Normal affect. Extremities: No clubbing or cyanosis.  HEENT: Normal.  Assessment/Plan: 1. Chronic systolic CHF: EF 21% on last echo. Has been presumed nonischemic given LHC in 12/15 showing only 80% stenosis in small OM1.  She appears at least mildly volume overloaded on exam.  Torsemide was cut back recently during admission with AKI.  More limited by claudication than dypsnea.  - Increase torsemide to 80 mg daily. Increase KCl to 30 daily. BMET in 1 week.  - Off bisoprolol with episodes of junctional bradycardia. - Off lisinopril with recent AKI.   - Try to limit dietary sodium.   2. Atrial fibrillation: Now in NSR on amiodarone.  She is on warfarin (taken off NOAC with fluctuation in creatinine).  - Continue amiodarone 100 daily.  PCP following hypothyroidism (on Levoxyl).  AST/ALT were normal in 3/17.  Will need regular eye exams. 3. PAD: Bilateral calf claudication seems quit limiting.  No rest pain or pedal ulcers.  Peripheral arterial dopplers show severe disease on right, may not have interventional options.  Not candidate for cilostazol with CHF.   - I would like her to get back in to see Dr Gwenlyn Found, will arrange appointment.   4. COPD: On home oxygen.  Has quit smoking, needs to stay  off cigarettes.  5. H/o cirrhosis: Likely from ETOH, no longer drinks.  6. Suspected OSA: We will reschedule sleep study for her.  7. CKD: Creatinine 1.45 most recently. Off ACEI with recent AKI.  8. HTN: Increase amlodipine to 10 mg daily.  9. Bradycardia: Junctional rhythm when hospitalized in 3/17.  This has resolved.  She is off diltiazem and bisoprolol.   Followup in 2 wks.  Loralie Champagne 02/12/2016

## 2016-02-14 DIAGNOSIS — I2510 Atherosclerotic heart disease of native coronary artery without angina pectoris: Secondary | ICD-10-CM | POA: Diagnosis not present

## 2016-02-14 DIAGNOSIS — I4891 Unspecified atrial fibrillation: Secondary | ICD-10-CM | POA: Diagnosis not present

## 2016-02-14 DIAGNOSIS — K7030 Alcoholic cirrhosis of liver without ascites: Secondary | ICD-10-CM | POA: Diagnosis not present

## 2016-02-14 DIAGNOSIS — I5043 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) and diastolic (congestive) heart failure: Secondary | ICD-10-CM | POA: Diagnosis not present

## 2016-02-15 ENCOUNTER — Telehealth (HOSPITAL_COMMUNITY): Payer: Self-pay | Admitting: *Deleted

## 2016-02-15 ENCOUNTER — Ambulatory Visit (INDEPENDENT_AMBULATORY_CARE_PROVIDER_SITE_OTHER): Payer: Medicare Other | Admitting: Cardiology

## 2016-02-15 ENCOUNTER — Ambulatory Visit (INDEPENDENT_AMBULATORY_CARE_PROVIDER_SITE_OTHER): Payer: Medicare Other

## 2016-02-15 ENCOUNTER — Encounter: Payer: Self-pay | Admitting: Cardiology

## 2016-02-15 VITALS — BP 130/72 | HR 93 | Ht 64.0 in | Wt 235.4 lb

## 2016-02-15 DIAGNOSIS — I480 Paroxysmal atrial fibrillation: Secondary | ICD-10-CM

## 2016-02-15 DIAGNOSIS — I4891 Unspecified atrial fibrillation: Secondary | ICD-10-CM

## 2016-02-15 DIAGNOSIS — Z5181 Encounter for therapeutic drug level monitoring: Secondary | ICD-10-CM | POA: Diagnosis not present

## 2016-02-15 DIAGNOSIS — I6523 Occlusion and stenosis of bilateral carotid arteries: Secondary | ICD-10-CM

## 2016-02-15 LAB — POCT INR: INR: 3.3
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
## 2016-02-15 NOTE — Patient Instructions (Signed)
Medication Instructions:  Your physician recommends that you continue on your current medications as directed. Please refer to the Current Medication list given to you today.   Labwork: NONE  Testing/Procedures: NONE  Follow-Up: Your physician recommends that you schedule a follow-up appointment in: AS SCHEDULED  Any Other Special Instructions Will Be Listed Below (If Applicable).     If you need a refill on your cardiac medications before your next appointment, please call your pharmacy.

## 2016-02-15 NOTE — Telephone Encounter (Addendum)
[name] called to report pt's BP today is 170/102 HR 100 wt 234 lb, was 235 yesterday, pt's ankle are swollen she states she has not seen pt in several weeks so she is unsure if swelling is worse or not, per pt she feels it is a little better than last week.  Pt was seen her Demetrius Charity and was advised to increase Amlodipine to 10 mg and Torsemide to 80 mg, [name] reports pt had increased the Torsemide but just increased the Amlodipine today.  [name] states she will see pt again in a few days to see if the increased Amlodipine helps BP

## 2016-02-16 ENCOUNTER — Ambulatory Visit
Admission: RE | Admit: 2016-02-16 | Discharge: 2016-02-16 | Disposition: A | Discharge status: Home / Self Care | Payer: Medicare Other | Source: Ambulatory Visit

## 2016-02-16 ENCOUNTER — Other Ambulatory Visit (HOSPITAL_COMMUNITY): Payer: Self-pay | Admitting: *Deleted

## 2016-02-16 ENCOUNTER — Other Ambulatory Visit: Payer: Self-pay | Admitting: Family Medicine

## 2016-02-16 DIAGNOSIS — I5043 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) and diastolic (congestive) heart failure: Secondary | ICD-10-CM

## 2016-02-16 DIAGNOSIS — Z1231 Encounter for screening mammogram for malignant neoplasm of breast: Secondary | ICD-10-CM

## 2016-02-16 MED ORDER — TORSEMIDE 20 MG PO TABS: 80.0000 mg | ORAL_TABLET | Freq: Once | ORAL | Status: DC

## 2016-02-19 ENCOUNTER — Ambulatory Visit (HOSPITAL_COMMUNITY)
Admission: RE | Admit: 2016-02-19 | Discharge: 2016-02-19 | Disposition: A | Discharge status: Home / Self Care | Payer: Medicare Other | Source: Ambulatory Visit | Attending: Cardiology | Admitting: Cardiology

## 2016-02-19 ENCOUNTER — Other Ambulatory Visit: Payer: Self-pay | Admitting: Internal Medicine

## 2016-02-19 DIAGNOSIS — I5022 Chronic systolic (congestive) heart failure: Secondary | ICD-10-CM

## 2016-02-19 DIAGNOSIS — R0600 Dyspnea, unspecified: Secondary | ICD-10-CM

## 2016-02-19 LAB — BASIC METABOLIC PANEL
ANION GAP: 13 (ref 5–15)
BUN: 49 mg/dL — ABNORMAL HIGH (ref 6–20)
CALCIUM: 9.4 mg/dL (ref 8.9–10.3)
CHLORIDE: 99 mmol/L — AB (ref 101–111)
CO2: 27 mmol/L (ref 22–32)
CREATININE: 1.61 mg/dL — AB (ref 0.44–1.00)
GFR calc non Af Amer: 36 mL/min — ABNORMAL LOW (ref >60)
GFR, EST AFRICAN AMERICAN: 41 mL/min — AB (ref >60)
Glucose, Bld: 286 mg/dL — ABNORMAL HIGH (ref 65–99)
Potassium: 4.4 mmol/L (ref 3.5–5.1)
SODIUM: 139 mmol/L (ref 135–145)

## 2016-02-22 ENCOUNTER — Encounter: Payer: Self-pay | Admitting: Internal Medicine

## 2016-02-22 ENCOUNTER — Ambulatory Visit (INDEPENDENT_AMBULATORY_CARE_PROVIDER_SITE_OTHER): Payer: Medicare Other | Admitting: Pharmacist Clinician (PhC)/ Clinical Pharmacy Specialist

## 2016-02-22 ENCOUNTER — Ambulatory Visit (INDEPENDENT_AMBULATORY_CARE_PROVIDER_SITE_OTHER)
Admission: RE | Admit: 2016-02-22 | Discharge: 2016-02-22 | Disposition: A | Discharge status: Home / Self Care | Payer: Medicare Other | Source: Ambulatory Visit | Attending: Internal Medicine | Admitting: Internal Medicine

## 2016-02-22 ENCOUNTER — Ambulatory Visit (INDEPENDENT_AMBULATORY_CARE_PROVIDER_SITE_OTHER): Payer: Medicare Other | Admitting: Internal Medicine

## 2016-02-22 VITALS — BP 130/72 | HR 89 | Ht 64.5 in | Wt 237.0 lb

## 2016-02-22 DIAGNOSIS — I4891 Unspecified atrial fibrillation: Secondary | ICD-10-CM | POA: Diagnosis not present

## 2016-02-22 DIAGNOSIS — I6523 Occlusion and stenosis of bilateral carotid arteries: Secondary | ICD-10-CM | POA: Diagnosis not present

## 2016-02-22 DIAGNOSIS — I480 Paroxysmal atrial fibrillation: Secondary | ICD-10-CM

## 2016-02-22 DIAGNOSIS — R0609 Other forms of dyspnea: Secondary | ICD-10-CM

## 2016-02-22 DIAGNOSIS — R0600 Dyspnea, unspecified: Secondary | ICD-10-CM

## 2016-02-22 DIAGNOSIS — J449 Chronic obstructive pulmonary disease, unspecified: Secondary | ICD-10-CM | POA: Diagnosis not present

## 2016-02-22 DIAGNOSIS — Z5181 Encounter for therapeutic drug level monitoring: Secondary | ICD-10-CM | POA: Diagnosis not present

## 2016-02-22 LAB — PULMONARY FUNCTION TEST
DL/VA % PRED: 56 %
DL/VA: 2.73 ml/min/mmHg/L
DLCO UNC % PRED: 49 %
DLCO UNC: 12.42 ml/min/mmHg
FEF 25-75 POST: 1.49 L/s
FEF 25-75 PRE: 1.5 L/s
FEF2575-%Change-Post: 0 %
FEF2575-%PRED-PRE: 56 %
FEF2575-%Pred-Post: 55 %
FEV1-%Change-Post: 0 %
FEV1-%Pred-Post: 76 %
FEV1-%Pred-Pre: 76 %
FEV1-PRE: 2.13 L
FEV1-Post: 2.12 L
FEV1FVC-%CHANGE-POST: 0 %
FEV1FVC-%PRED-PRE: 92 %
FEV6-%CHANGE-POST: 0 %
FEV6-%Pred-Post: 85 %
FEV6-%Pred-Pre: 85 %
FEV6-POST: 2.92 L
FEV6-Pre: 2.93 L
FEV6FVC-%Change-Post: 0 %
FEV6FVC-%PRED-POST: 103 %
FEV6FVC-%Pred-Pre: 103 %
FVC-%Change-Post: 0 %
FVC-%PRED-PRE: 82 %
FVC-%Pred-Post: 82 %
FVC-POST: 2.93 L
FVC-PRE: 2.93 L
PRE FEV1/FVC RATIO: 73 %
Post FEV1/FVC ratio: 73 %
Post FEV6/FVC ratio: 100 %
Pre FEV6/FVC Ratio: 100 %
RV % pred: 100 %
RV: 1.88 L
TLC % PRED: 94 %
TLC: 4.84 L

## 2016-02-22 LAB — POCT INR: INR: 1.6

## 2016-02-22 NOTE — Progress Notes (Signed)
Subjective:     Patient ID: [name], female   DOB: 11/01/1961   MRN: [id number]  HPI   36 yowf  Quit smoking 10/2015  with nl pfts 10/15/2014 referred to pulmonary clinic 09/16/2014 p admit  Admission date: 09/07/2014   Discharge Date: 09/09/2014   Discharge Diagnosis SOB (shortness of breath) [R06.02] Acute bronchitis with chronic obstructive pulmonary disease (COPD) [J44.1] Hyperglycemia [R73.9] Persistent atrial fibrillation [I48.1] DM (diabetes mellitus), type 2 with peripheral vascular complications [id number] COPD with acute exacerbation [J44.1] HCAP (healthcare-associated pneumonia) [J18.9] Acute on chronic diastolic CHF (congestive heart failure), NYHA class 3 [I50.33] Anemia, unspecified anemia type [D64.9] Hypothyroidism, unspecified hypothyroidism type [E03.9]   Principal Problem:  HCAP (healthcare-associated pneumonia) Active Problems:  COPD (chronic obstructive pulmonary disease)  Atrial fibrillation  DM (diabetes mellitus), type 2 with peripheral vascular complications  Long term current use of anticoagulant therapy  Hypothyroidism  S/P peripheral artery angioplasty - TurboHawk atherectomy; R SFA  Acute on chronic diastolic CHF (congestive heart failure), NYHA class 3     09/16/2014 1st [facility] office visit/ Draylon Mercadel  / on ACEi  Chief Complaint  Patient presents with  . HFU    Pt reports her breathing has improved back to baseline since hospital d/c. She still has some cough in the am- white sputum.   chronic doe x across the room  X one year  Not much change between good and bad days /neb helps some Cough is mostly in ams rec Stop lisinopril and replace it with diovan (valsartan) 160 mg daily  The key is to stop smoking completely before smoking completely stops you!  Continue to use the nebulizer as needed up to 4 x daily if needed but you should gradually improve especially if you are not smoking           10/15/2014 f/u  ov/Kholton Coate re: chronic cough/ no evidence copd  Chief Complaint  Patient presents with  . Follow-up    PFT done today. Cough has improved some. No new co's today.  She is using rescue inhaler 1 to 2 times per wk on average.   Not limited by breathing from desired activities   rec The key is to stop smoking completely before smoking completely stops you - it's not too late [place] to use inhalers as needed but you should not need them regularly and if need goes up please return here right away - otherwise follow up here is as needed    02/22/2016  f/u ov/Karsin Pesta re: obesity/ maint rx with amiodarone ? D/c 'd it  / symb/spiriva s/p smoking cessatoin 10/2015  Chief Complaint  Patient presents with  . HFU    PFT done today. Breathing is doing well today. She is on 02 2lpm most of the time. She is not smoking x 2 months. She uses albuterol inhaler 1 x per wk in average and has not needed neb recently.  Not limited by breathing from desired activities but by painful tingling R > L and L knee pain  Uses 02 at hs and prn daytime x 2 months    No obvious day to day or daytime variabilty or assoc excess or purulent sputum production or    cp or chest tightness, subjective wheeze overt sinus or hb symptoms. No unusual exp hx or h/o childhood pna/ asthma or knowledge of premature birth.  Sleeping ok without nocturnal  or early am exacerbation  of respiratory  c/o's or need for noct saba. Also denies  any obvious fluctuation of symptoms with weather or environmental changes or other aggravating or alleviating factors except as outlined above   Current Medications, Allergies, Complete Past Medical History, Past Surgical History, Family History, and Social History were reviewed in [employer] record.  ROS  The following are not active complaints unless bolded sore throat, dysphagia, dental problems, itching, sneezing,  nasal congestion or excess/ purulent secretions, ear ache,   fever, chills,  sweats, unintended wt loss, pleuritic or exertional cp, hemoptysis,  orthopnea pnd or leg swelling, presyncope, palpitations, heartburn, abdominal pain, anorexia, nausea, vomiting, diarrhea  or change in bowel or urinary habits, change in stools or urine, dysuria,hematuria,  rash, arthralgias, visual complaints, headache, numbness weakness or ataxia or problems with walking or coordination,  change in mood/affect or memory.               Objective:   Physical Exam  Obese wf w/c bound nad  10/15/2014        220 > 02/22/2016   237     09/09/14 224 lb 10.4 oz (101.9 kg)  06/17/14 214 lb 15.2 oz (97.5 kg)  03/13/14 204 lb 14.4 oz (92.942 kg)       HEENT: nl dentition, turbinates, and orophanx. Nl external ear canals without cough reflex   NECK :  without JVD/Nodes/TM/ nl carotid upstrokes bilaterally   LUNGS: no acc muscle use, clear to A and P bilaterally without cough on insp or exp maneuvers   CV:  RRR  no s3 or murmur or increase in P2, no edema   ABD:  soft and nontender with nl excursion in the supine position. No bruits or organomegaly, bowel sounds nl  MS:  warm without deformities, calf tenderness, cyanosis or clubbing  SKIN: warm and dry without lesions    NEURO:  alert, approp, no deficits        CXR PA and Lateral:   02/22/2016 :    I personally reviewed images and agree with radiology impression as follows:    The heart size and mediastinal contours are within normal limits. Both lungs are clear. The visualized skeletal structures are unremarkable.        Assessment:

## 2016-02-22 NOTE — Progress Notes (Signed)
Quick Note:  ATC, NA and no option to leave msg ______ 

## 2016-02-22 NOTE — Patient Instructions (Addendum)
Please remember to go to the x-ray department downstairs for your tests - we will call you with the results when they are available.  Try off spiriva (think of it like  high octane) to see your breathing is just as good without it   If you go back on amiodarone, you will need repeat pfts in 3 months

## 2016-02-22 NOTE — Progress Notes (Signed)
PFT done today. 02/22/2016

## 2016-02-23 ENCOUNTER — Other Ambulatory Visit (HOSPITAL_COMMUNITY): Payer: Self-pay

## 2016-02-23 ENCOUNTER — Telehealth (HOSPITAL_COMMUNITY): Payer: Self-pay

## 2016-02-23 DIAGNOSIS — I5043 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) and diastolic (congestive) heart failure: Secondary | ICD-10-CM

## 2016-02-23 MED ORDER — AMIODARONE HCL 100 MG PO TABS: 100.0000 mg | ORAL_TABLET | Freq: Every day | ORAL | Status: DC

## 2016-02-23 MED ORDER — AMLODIPINE BESYLATE 10 MG PO TABS: 10.0000 mg | ORAL_TABLET | Freq: Every day | ORAL | Status: DC

## 2016-02-23 NOTE — Telephone Encounter (Signed)
[name] with [facility] called to report patient is out of amlodipine and amiodarone completely and pharmacy will not give any additional refills until seen by our office. Unclear how this has happened after reviewing chart with recent phone noted between [facility] ([facility]) and [name] (RN CHF clinic) where discussions were had about increasing medication in the home and refills were sent at that time.  Too, patient just saw Dr. Aundra Dubin in our office 2 weeks ago. Will send in updated Rx's to pharmacy. Advised [name] these are being sent in and to ensure patient received these.  [name]

## 2016-02-23 NOTE — Progress Notes (Signed)
Quick Note:  ATC, NA and unable to leave msg ______

## 2016-02-24 DIAGNOSIS — E66812 Obesity, class 2: Secondary | ICD-10-CM | POA: Insufficient documentation

## 2016-02-24 DIAGNOSIS — J449 Chronic obstructive pulmonary disease, unspecified: Secondary | ICD-10-CM | POA: Insufficient documentation

## 2016-02-24 NOTE — Progress Notes (Signed)
Quick Note:  ATC, NA and VM note set up ______

## 2016-02-24 NOTE — Assessment & Plan Note (Signed)
Assoc with ERV 13% 02/22/2016 on pfts  Body mass index is 40.07    Lab Results  Component Value Date   TSH 4.112 01/12/2016     Contributing to gerd tendency/ doe/reviewed the need and the process to achieve and maintain neg calorie balance > defer f/u primary care including intermittently monitoring thyroid status

## 2016-02-24 NOTE — Assessment & Plan Note (Addendum)
09/16/2014  Walked RA  2 laps @ 185 ft each stopped due to  No desat,  Nl pace, no desat - 10/15/2014 PFTs s airflow obst/ erv 42 c/w obesity effects - PFT's  02/22/2016  Nl p am symb/spiriva except  ERV 13%  DLCO  49 % corrects to 56  % for alv volume    No def evidence of amiodarone toxicity but she is at risk if restarts due to relatively low baseline dlco  Patients typically have been on amiodarone for 6-12 months before this complication manifests.  Of note, serial clinical evaluation for symptoms such as cough dyspnea or fevers is  the preferred method of monitoring for pulmonary toxicity because a decrease in DLCO or lung volumes is a nonspecific for toxicity. Pathologically amiodarone pulmonary toxicity may appear as interstitial pneumonitis, eosinophilic pneumonia, organizing pneumonia, pulmonary fibrosis or less commonly as diffuse alveolar hemorrhage, pulmonary nodules or pleural effusions.  Risk factors for pulmonary toxicity include age greater than 42, daily dose greater than equal to 400 mg, a high cumulative dose, or pre-existing lung disease  (in this case the low dlco is the only abnormality and is lower than prev value but neither is corrected for hgb so hard to draw any conclusions).  If resumes amio rec repeat pfts in 3 m

## 2016-02-24 NOTE — Assessment & Plan Note (Signed)
Quit smoking 10/2015  - no sign airflow obst 02/22/2016 p am spiriva /symb> try off spiriva     I reviewed the Fletcher curve with the patient that basically indicates  if you quit smoking when your best day FEV1 is still well preserved (as is clearly  the case here)  it is highly unlikely you will progress to severe disease and informed the patient there was no medication on the market that has proven to alter the curve/ its downward trajectory  or the likelihood of progression of their disease.  Therefore stopping smoking and maintaining abstinence is the most important aspect of her care, not choice of inhalers or for that matter, doctors.

## 2016-02-25 ENCOUNTER — Other Ambulatory Visit (HOSPITAL_COMMUNITY): Payer: Self-pay | Admitting: Pharmacist

## 2016-02-25 DIAGNOSIS — I5043 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) and diastolic (congestive) heart failure: Secondary | ICD-10-CM

## 2016-02-25 MED ORDER — AMIODARONE HCL 100 MG PO TABS: 100.0000 mg | ORAL_TABLET | Freq: Every day | ORAL | Status: DC

## 2016-02-25 MED ORDER — AMLODIPINE BESYLATE 10 MG PO TABS: 10.0000 mg | ORAL_TABLET | Freq: Every day | ORAL | Status: DC

## 2016-02-27 IMAGING — CR DG CHEST 2V
2 series · 2 of 2 positions shown · non-contrast
Comparison: 04/29/2015

CLINICAL DATA: Dyspnea. Cough, shortness of breath, nausea and
headache.

EXAM:
CHEST  2 VIEW

[chest pa]
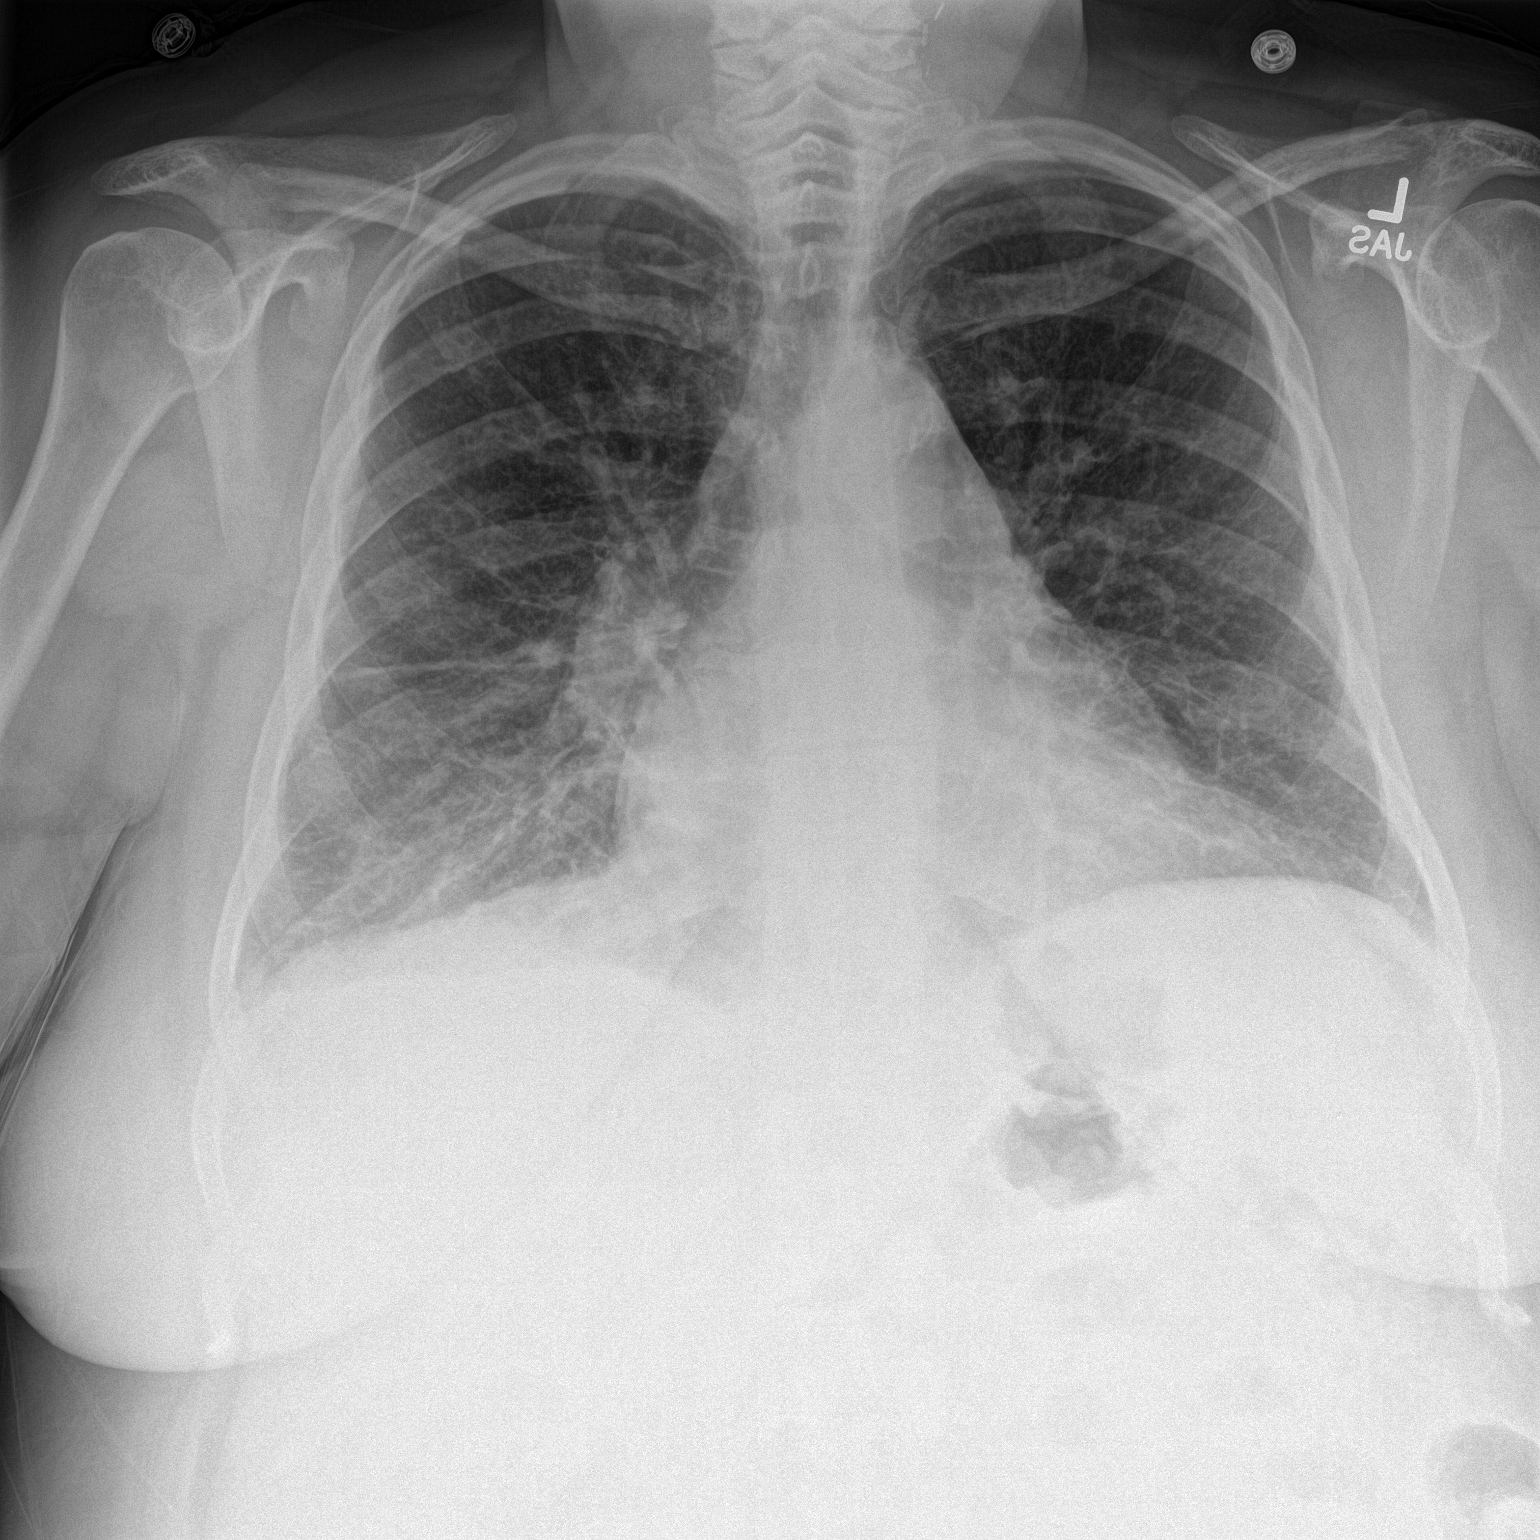

[chest lat]
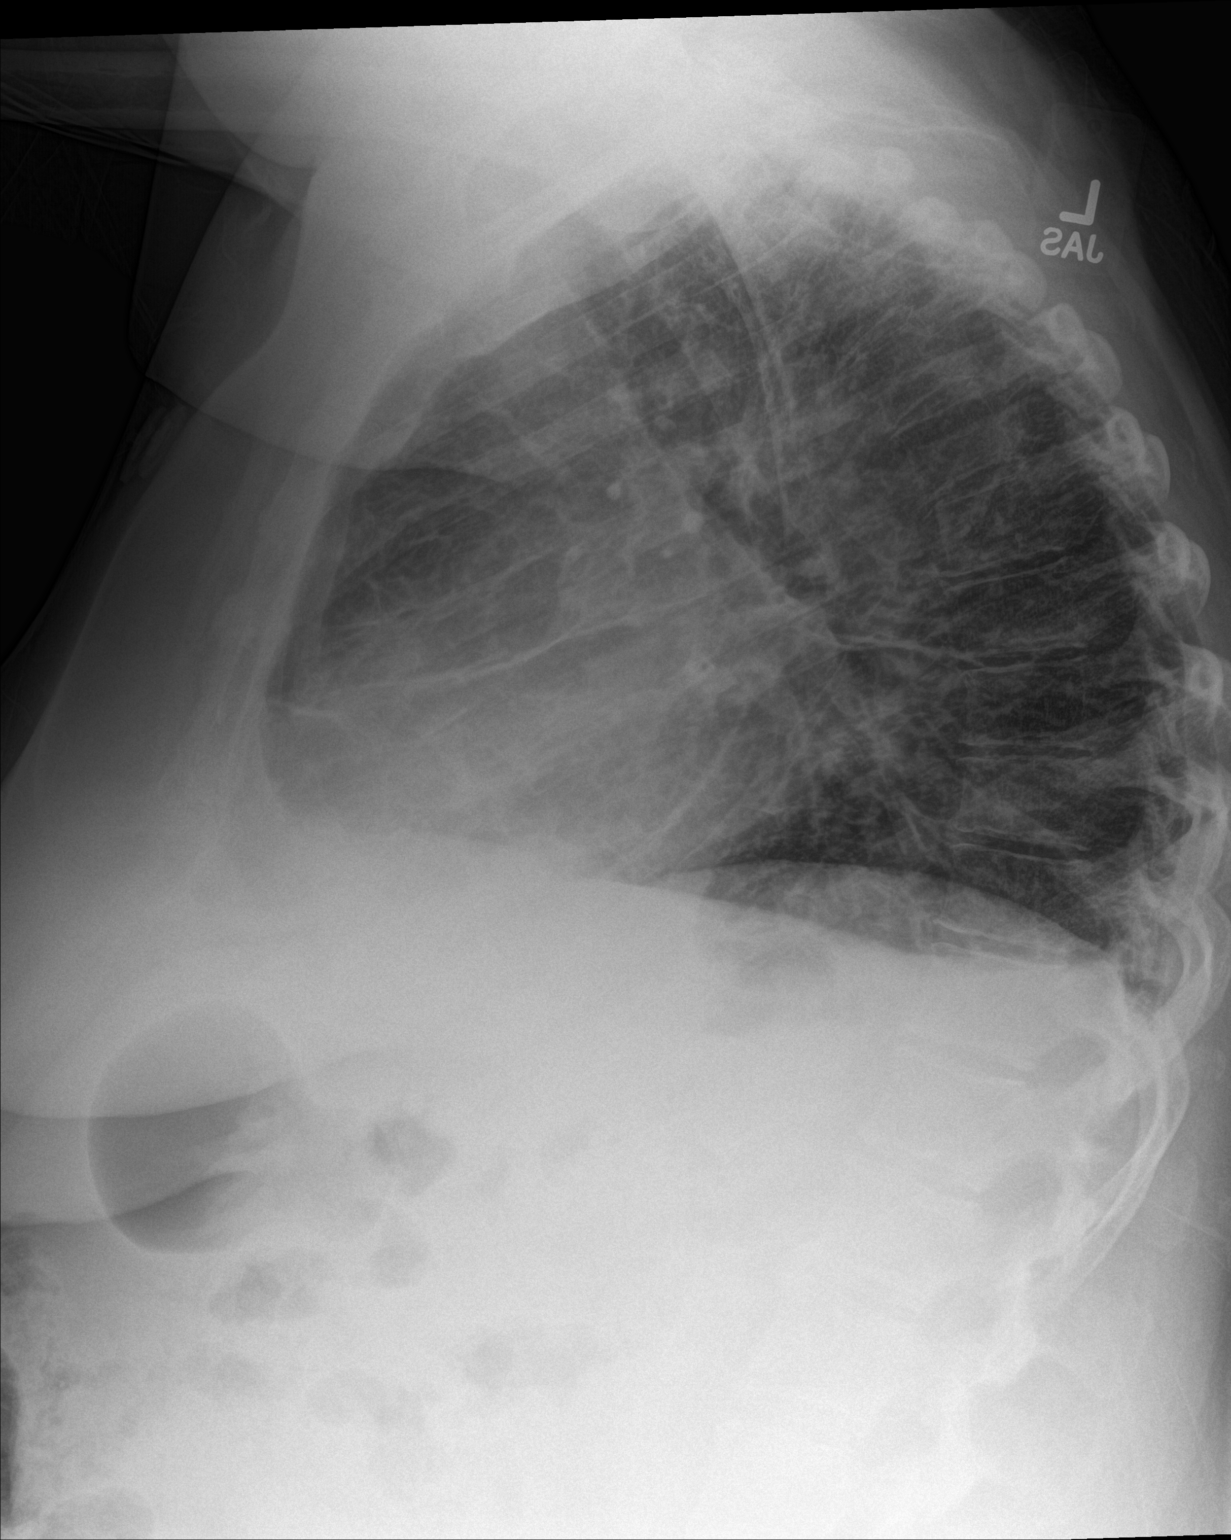

[2 of 2 positions shown; findings below may reference images not displayed]

FINDINGS: Lung volumes are low. Heart remains at the upper limits of normal in
size. Prominent interstitial opacities suspicious for pulmonary
edema, similar to prior exam. Small right pleural effusion and fluid
in the right minor fissure. Mild atelectasis at the right lung base.
No consolidation to suggest pneumonia. No pneumothorax. No acute
osseous abnormality is seen.
IMPRESSION: 1. Stable borderline cardiomegaly and interstitial prominence
suspicious for pulmonary edema.
2. Small right pleural effusion, new.

## 2016-03-01 ENCOUNTER — Other Ambulatory Visit: Payer: Self-pay | Admitting: Family Medicine

## 2016-03-01 ENCOUNTER — Ambulatory Visit (INDEPENDENT_AMBULATORY_CARE_PROVIDER_SITE_OTHER): Payer: Medicare Other | Admitting: Pharmacist Clinician (PhC)/ Clinical Pharmacy Specialist

## 2016-03-01 ENCOUNTER — Encounter: Payer: Self-pay | Admitting: *Deleted

## 2016-03-01 ENCOUNTER — Ambulatory Visit (HOSPITAL_COMMUNITY)
Admission: RE | Admit: 2016-03-01 | Discharge: 2016-03-01 | Disposition: A | Discharge status: Home / Self Care | Payer: Medicare Other | Source: Ambulatory Visit | Attending: Cardiology | Admitting: Cardiology

## 2016-03-01 ENCOUNTER — Ambulatory Visit (INDEPENDENT_AMBULATORY_CARE_PROVIDER_SITE_OTHER): Payer: Medicare Other | Admitting: Cardiovascular Disease

## 2016-03-01 ENCOUNTER — Encounter: Payer: Self-pay | Admitting: Cardiovascular Disease

## 2016-03-01 VITALS — BP 148/78 | HR 100 | Wt 231.8 lb

## 2016-03-01 VITALS — BP 138/82 | HR 100 | Ht 64.0 in | Wt 233.2 lb

## 2016-03-01 DIAGNOSIS — J449 Chronic obstructive pulmonary disease, unspecified: Secondary | ICD-10-CM | POA: Diagnosis not present

## 2016-03-01 DIAGNOSIS — E039 Hypothyroidism, unspecified: Secondary | ICD-10-CM | POA: Insufficient documentation

## 2016-03-01 DIAGNOSIS — I4891 Unspecified atrial fibrillation: Secondary | ICD-10-CM | POA: Diagnosis not present

## 2016-03-01 DIAGNOSIS — I2510 Atherosclerotic heart disease of native coronary artery without angina pectoris: Secondary | ICD-10-CM

## 2016-03-01 DIAGNOSIS — I130 Hypertensive heart and chronic kidney disease with heart failure and stage 1 through stage 4 chronic kidney disease, or unspecified chronic kidney disease: Secondary | ICD-10-CM | POA: Insufficient documentation

## 2016-03-01 DIAGNOSIS — Z79899 Other long term (current) drug therapy: Secondary | ICD-10-CM | POA: Insufficient documentation

## 2016-03-01 DIAGNOSIS — I428 Other cardiomyopathies: Secondary | ICD-10-CM | POA: Insufficient documentation

## 2016-03-01 DIAGNOSIS — N183 Chronic kidney disease, stage 3 (moderate): Secondary | ICD-10-CM | POA: Insufficient documentation

## 2016-03-01 DIAGNOSIS — I480 Paroxysmal atrial fibrillation: Secondary | ICD-10-CM | POA: Diagnosis not present

## 2016-03-01 DIAGNOSIS — I6523 Occlusion and stenosis of bilateral carotid arteries: Secondary | ICD-10-CM | POA: Diagnosis not present

## 2016-03-01 DIAGNOSIS — Z5181 Encounter for therapeutic drug level monitoring: Secondary | ICD-10-CM | POA: Diagnosis not present

## 2016-03-01 DIAGNOSIS — K7460 Unspecified cirrhosis of liver: Secondary | ICD-10-CM | POA: Insufficient documentation

## 2016-03-01 DIAGNOSIS — Z87891 Personal history of nicotine dependence: Secondary | ICD-10-CM | POA: Diagnosis not present

## 2016-03-01 DIAGNOSIS — I5022 Chronic systolic (congestive) heart failure: Secondary | ICD-10-CM | POA: Insufficient documentation

## 2016-03-01 DIAGNOSIS — Z7901 Long term (current) use of anticoagulants: Secondary | ICD-10-CM | POA: Diagnosis not present

## 2016-03-01 DIAGNOSIS — Z9981 Dependence on supplemental oxygen: Secondary | ICD-10-CM | POA: Diagnosis not present

## 2016-03-01 DIAGNOSIS — I739 Peripheral vascular disease, unspecified: Secondary | ICD-10-CM | POA: Insufficient documentation

## 2016-03-01 DIAGNOSIS — Z794 Long term (current) use of insulin: Secondary | ICD-10-CM | POA: Insufficient documentation

## 2016-03-01 DIAGNOSIS — Z8249 Family history of ischemic heart disease and other diseases of the circulatory system: Secondary | ICD-10-CM | POA: Insufficient documentation

## 2016-03-01 DIAGNOSIS — E1122 Type 2 diabetes mellitus with diabetic chronic kidney disease: Secondary | ICD-10-CM | POA: Insufficient documentation

## 2016-03-01 LAB — POCT INR: INR: 1.5

## 2016-03-01 MED ORDER — BISOPROLOL FUMARATE 5 MG PO TABS: 2.5000 mg | ORAL_TABLET | Freq: Every day | ORAL | Status: DC

## 2016-03-01 MED ORDER — ALBUTEROL SULFATE HFA 108 (90 BASE) MCG/ACT IN AERS: 2.0000 | INHALATION_SPRAY | Freq: Four times a day (QID) | RESPIRATORY_TRACT | Status: DC | PRN

## 2016-03-01 NOTE — Patient Instructions (Signed)
START Bisoprolol 2.5 mg (1/2 tablet) once daily.  Follow up with Dr. Aundra Dubin in 1 month.  Do the following things EVERYDAY: 1) Weigh yourself in the morning before breakfast. Write it down and keep it in a log. 2) Take your medicines as prescribed 3) Eat low salt foods-Limit salt (sodium) to 2000 mg per day.  4) Stay as active as you can everyday 5) Limit all fluids for the day to less than 2 liters

## 2016-03-01 NOTE — Progress Notes (Signed)
Quick Note:    Letter mailed.  ______

## 2016-03-01 NOTE — Progress Notes (Signed)
03/01/2016 [name]   10-12-1961  [id number]  Primary Physician Sandi Mariscal, MD Primary Cardiologist: [name] MD [place], [place]   HPI:  [name] is a 55 year old moderately overweight single Caucasian female mother of one child who lives with her sister who accompanies her today. She was referred by Dr. Franki Monte at [facility] for peripheral vascular evaluation because of critical limb ischemia. I last saw her in the office 12/18/15. She sees Dr. Jenkins Rouge at [facility] for cardiomyopathy and paroxysmal atrial fibrillation. Her cardiac risk factors include a 20-pack-year history of tobacco abuse (she has stopped smoking), treated diabetes, and hypertension as well as family history of heart disease. She's never had a heart attack or stroke. She does have COPD. She had paroxysmal atrial fibrillation in the past and has undergone cardioversion in [place].. The lower extremity arterial Doppler studies performed in our office suggested critical limb ischemia with an occluded SFA and popliteal arteries bilaterally with tibial vessel disease as well. She complains of lifestyle limiting claudication. I performed lower extremity angiography on 09/10/13 revealing occluded SFAs bilaterally with chronic occluded tibial vessels as well. I performed TurboHawk directional atherectomy of her entire right SFA with an excellent angiographic result removing a copious amount of atherosclerotic plaque. The ulcer on her right heel ultimately healed. She does continue to smoke one pack per day. She was recently admitted to [facility] because of atrial fibrillation with rapid ventricular response. She underwent cardiac catheterization on 10/31/14 revealing an ejection fraction of 30-35% with disease that was ultimately treated medically. Her carotid Doppler showed high-grade bilateral ICA disease and lower extremity Dopplers revealed restenosis within the right SFA with a  ABI of 0.34. She is symptomatic on that side. I performed angiography on her 01/15/15 revealing an occluded right internal carotid artery, 95% proximal left internal carotid artery stenosis, long 99% stenosis within the right SFA and an occluded left SFA. I performed [facility] 1 directional atherectomy and drug-eluting balloon angioplasty of her right SFA with an excellent Angiographic and clinical result. She underwent left carotid endarterectomy by Dr. Trula Slade on 02/19/15 which was uncomplicated. Since I saw her 6 months ago she's been remaining clinically stable. She denies chest pain or shortness of breath. She continues to have claudication.Lower extremity Doppler studies performed 01/23/15 revealed ABIs in the 0.6 range bilaterally with a patent right SFA. She continues to complain of claudication however.recent Dopplers performed 12/22/15 revealed a right TBI 0. 17. She continues to complain of bilateral calf claudication she also has arthritis in both knees. At this point, I did not feel inclined to perform further intervention.   Current Outpatient Prescriptions  Medication Sig Dispense Refill  . acetaminophen-codeine (TYLENOL #3) 300-30 MG tablet Take 1 tablet by mouth 2 (two) times daily.    [name] albuterol (PROVENTIL HFA;VENTOLIN HFA) 108 (90 Base) MCG/ACT inhaler Inhale 2 puffs into the lungs every 6 (six) hours as needed for wheezing or shortness of breath. 1 each 3  . albuterol (PROVENTIL) (2.5 MG/3ML) 0.083% nebulizer solution Take 3 mLs (2.5 mg total) by nebulization every 6 (six) hours as needed. For wheezing. 75 mL 2  . amiodarone (PACERONE) 100 MG tablet Take 1 tablet (100 mg total) by mouth daily. 30 tablet 11  . amLODipine (NORVASC) 10 MG tablet Take 1 tablet (10 mg total) by mouth daily. 30 tablet 6  . atorvastatin (LIPITOR) 40 MG tablet Take 1 tablet (40 mg total) by mouth daily. 30 tablet  2  . Blood Glucose Monitoring Suppl (ACCU-CHEK AVIVA PLUS) W/DEVICE KIT 1 Device by Does not apply route 4  (four) times daily -  before meals and at bedtime. 1 kit 0  . budesonide-formoterol (SYMBICORT) 160-4.5 MCG/ACT inhaler Inhale 2 puffs into the lungs 2 (two) times daily. 1 Inhaler 12  . cholecalciferol (VITAMIN D) 1000 units tablet Take 1,000 Units by mouth daily.    . folic acid (FOLVITE) 1 MG tablet Take 1 tablet (1 mg total) by mouth daily. 30 tablet 5  . gabapentin (NEURONTIN) 300 MG capsule Take 300 mg by mouth 4 (four) times daily.    . insulin aspart (NOVOLOG) 100 UNIT/ML FlexPen Inject 6-14 Units into the skin 3 (three) times daily with meals. Sliding scale 15 mL 11  . insulin glargine (LANTUS) 100 unit/mL SOPN Inject 0.7 mLs (70 Units total) into the skin at bedtime. 15 mL 11  . Insulin Pen Needle (ULTICARE MICRO PEN NEEDLES) 32G X 4 MM MISC 1 Syringe by Does not apply route 4 (four) times daily - after meals and at bedtime. 1 each 11  . Lancets (ACCU-CHEK SOFT TOUCH) lancets Use as instructed 100 each 12  . levothyroxine (SYNTHROID, LEVOTHROID) 50 MCG tablet Take 1 tablet (50 mcg total) by mouth daily. 30 tablet 2  . nitroGLYCERIN (NITROSTAT) 0.4 MG SL tablet Place 1 tablet (0.4 mg total) under the tongue every 5 (five) minutes as needed for chest pain. 25 tablet 3  . OXYGEN 2lpm 24/7 AHC    . potassium chloride SA (K-DUR,KLOR-CON) 20 MEQ tablet Take 1.5 tablets (30 mEq total) by mouth daily. 45 tablet 3  . torsemide (DEMADEX) 20 MG tablet Take 4 tablets (80 mg total) by mouth once. 120 tablet 6  . vitamin B-12 1000 MCG tablet Take 1 tablet (1,000 mcg total) by mouth daily. 30 tablet 5  . warfarin (COUMADIN) 5 MG tablet Take 1.5 tablet (total 7.5 mg) tonight and tomorrow 02/09/16 at supper and check INR 02/10/16. 45 tablet 0   No current facility-administered medications for this visit.    No Known Allergies  Social History   Social History  . Marital Status: Divorced    Spouse Name: N/A  . Number of Children: N/A  . Years of Education: N/A   Occupational History  . disabled     Social History Main Topics  . Smoking status: Former Smoker -- 0.00 packs/day for 40 years    Types: E-cigarettes    Quit date: 11/03/2015  . Smokeless tobacco: Former Systems developer  . Alcohol Use: No  . Drug Use: No     Comment: 04/29/2015 "last drug use was ~ 09/08/2013"  . Sexual Activity: No   Other Topics Concern  . Not on file   Social History Narrative   Lives in [place], in [place] with sister.  They are looking to move but don't have a place to go yet.       Review of Systems: General: negative for chills, fever, night sweats or weight changes.  Cardiovascular: negative for chest pain, dyspnea on exertion, edema, orthopnea, palpitations, paroxysmal nocturnal dyspnea or shortness of breath Dermatological: negative for rash Respiratory: negative for cough or wheezing Urologic: negative for hematuria Abdominal: negative for nausea, vomiting, diarrhea, bright red blood per rectum, melena, or hematemesis Neurologic: negative for visual changes, syncope, or dizziness All other systems reviewed and are otherwise negative except as noted above.    Blood pressure 138/82, pulse 100, height '5\' 4"'  (1.626 m), weight 233 lb  3.2 oz (105.779 kg), last menstrual period 11/27/2012, SpO2 96 %.  General appearance: alert and no distress Neck: no adenopathy, no carotid bruit, no JVD, supple, symmetrical, trachea midline and thyroid not enlarged, symmetric, no tenderness/mass/nodules Lungs: clear to auscultation bilaterally Heart: regular rate and rhythm, S1, S2 normal, no murmur, click, rub or gallop Extremities: extremities normal, atraumatic, no cyanosis or edema  EKG not performed today  ASSESSMENT AND PLAN:   Claudication ([place]) History of peripheral arterial disease status post angiography 01/15/15 revealing a long subtotally occluded right SFA which I intervened unsuccessfully and total left SFA. She had 1 vessel runoff bilaterally. She has had recurrent claudication with worsening Dopplers. Echo  also demonstrated total right internal carotid and 95% left which ultimately Dr. Trula Slade operated on performed left carotid endarterectomy which we have been following by duplex ultrasound.      [name] MD FACP,FACC,FAHA, [facility] 03/01/2016 12:08 PM

## 2016-03-01 NOTE — Patient Instructions (Addendum)
Medication Instructions:  Your physician recommends that you continue on your current medications as directed. Please refer to the Current Medication list given to you today.   Labwork: none  Testing/Procedures: Your physician has requested that you have a lower extremity arterial doppler- During this test, ultrasound is used to evaluate arterial blood flow in the legs. Allow approximately one hour for this exam. April 2018   Follow-Up: Your physician wants you to follow-up in: 12 months with Dr. Gwenlyn Found. You will receive a reminder letter in the mail two months in advance. If you don't receive a letter, please call our office to schedule the follow-up appointment.  Any Other Special Instructions Will Be Listed Below (If Applicable).     If you need a refill on your cardiac medications before your next appointment, please call your pharmacy.

## 2016-03-01 NOTE — Assessment & Plan Note (Signed)
History of peripheral arterial disease status post angiography 01/15/15 revealing a long subtotally occluded right SFA which I intervened unsuccessfully and total left SFA. She had 1 vessel runoff bilaterally. She has had recurrent claudication with worsening Dopplers. Echo also demonstrated total right internal carotid and 95% left which ultimately Dr. Trula Slade operated on performed left carotid endarterectomy which we have been following by duplex ultrasound.

## 2016-03-02 ENCOUNTER — Other Ambulatory Visit: Payer: Self-pay

## 2016-03-02 DIAGNOSIS — I739 Peripheral vascular disease, unspecified: Secondary | ICD-10-CM

## 2016-03-02 NOTE — Progress Notes (Signed)
Patient ID: [name], female   DOB: [birth year], 55 y.o.   MRN: [id number]    Advanced Heart Failure Clinic Note   PCP: Dr Roxy Manns HF Cardiology: Dr. Aundra Dubin PV: Dr. Gwenlyn Found  55 yo with history of PAD, carotid stenosis s/p left CEA, relatively mild CAD, chronic systolic CHF, paroxysmal atrial fibrillation and prior substance abuse presents for CHF clinic followup.  She was admitted in 1/17 with acute hypoxemic respiratory failure in the setting of atrial fibrillation/RVR and volume overload.  She was initially intubated.  She converted back to NSR with amiodarone gtt.  She was treated with IV Lasix, steroids, bronchodilators.  She developed AKI and losartan was stopped.    She was admitted in 3/17 with symptomatic bradycardia.  She was supposed to stop diltiazem after this appointment but continued to take it.  She presented later in 3/17, again with symptomatic bradycardia (junctional bradycardia), hypotension, and AKI.  Diltiazem and bisoprolol were stopped.  HR recovered and is actually elevated today, rate around 100 bpm.  ACEI was stopped with AKI.   She now seems to be taking her medications as ordered.  Paramedicine is following her.  She is on home oxygen.  She remains in NSR on low dose amiodarone.  BP is high.  She is able to walk about 3-4 minutes then develops bilateral calf pain (R>L), this is stable.  No foot ulcers.  She denies significant exertional dyspnea but is minimally active, mainly due to claudication.  No chest pain.  No orthopnea/PND.  No lightheadedness.  She saw Dr Gwenlyn Found recently, no plan for PAD intervention for now. Weight is down 5 lbs.   Labs (1/17): K 5.2, creatinine 1.4, AST 43, ALT normal Labs (2/17): K 4.2, creatinine 1.3, BNP 607, TSH normal Labs (3/17): K 4.2, creatinine 1.45, AST/ALT normal, HCT 28.7 Labs (4/17): K 4.4, creatinine 1.61  PMH: 1. Carotid stenosis: Known occluded right carotid.  Left CEA in 4/16.  2. CAD: LHC in 12/15 with 80% stenosis in  small OM1, nonobstructive disease in other territories.  3. Chronic systolic CHF: Nonischemic cardiomyopathy (?due to ETOH or prior drug abuse).  - Echo (1/17) with EF 45%, mild LV hypertrophy, moderate diastolic dysfunction, inferolateral severe hypokinesis, mildly decreased RV systolic function.  4. Atrial fibrillation: Paroxysmal.   5. Type II diabetes 6. CKD: Stage III. 7. COPD: Quit smoking 1/17. On home oxygen.  8. 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  No longer drinks.  9. Hypothyroidism 10. PAD: Atherectomy SFA in 2014 (Dr Gwenlyn Found).  Peripheral arterial dopplers (2/17) with focal 75-99% proximal right SFA stenosis, occluded mid-distal right SFA, chronic occlusion of all runoff arteries on right. 11. Anemia 12. Prior cocaine abuse.  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   SH: Lives with sister.  Prior cocaine abuse.  Prior ETOH abuse.  Quit smoking in 1/17.  FH: CAD  ROS: All systems reviewed and negative except as per HPI.   Current Outpatient Prescriptions  Medication Sig Dispense Refill  . acetaminophen-codeine (TYLENOL #3) 300-30 MG tablet Take 1 tablet by mouth 2 (two) times daily.    [name] albuterol (PROVENTIL HFA;VENTOLIN HFA) 108 (90 Base) MCG/ACT inhaler Inhale 2 puffs into the lungs every 6 (six) hours as needed for wheezing or shortness of breath. 1 each 3  . albuterol (PROVENTIL) (2.5 MG/3ML) 0.083% nebulizer solution Take 3 mLs (2.5 mg total) by nebulization every 6 (six) hours as needed. For wheezing. 75 mL 2  . amiodarone (PACERONE)  200 MG tablet Take 100 mg by mouth daily.    [name] amLODipine (NORVASC) 10 MG tablet Take 1 tablet (10 mg total) by mouth daily. 30 tablet 6  . atorvastatin (LIPITOR) 40 MG tablet Take 1 tablet (40 mg total) by mouth daily. 30 tablet 2  . Blood Glucose Monitoring Suppl (ACCU-CHEK AVIVA PLUS) W/DEVICE KIT 1 Device by Does not apply route 4 (four) times daily -  before meals and at bedtime. 1 kit 0  .  budesonide-formoterol (SYMBICORT) 160-4.5 MCG/ACT inhaler Inhale 2 puffs into the lungs 2 (two) times daily. 1 Inhaler 12  . cholecalciferol (VITAMIN D) 1000 units tablet Take 1,000 Units by mouth daily.    . folic acid (FOLVITE) 1 MG tablet Take 1 tablet (1 mg total) by mouth daily. 30 tablet 5  . gabapentin (NEURONTIN) 300 MG capsule Take 300 mg by mouth 4 (four) times daily.    . insulin aspart (NOVOLOG FLEXPEN) 100 UNIT/ML FlexPen Inject 17-32 Units into the skin 3 (three) times daily with meals.    . Insulin Glargine (LANTUS SOLOSTAR) 100 UNIT/ML Solostar Pen Inject 50 Units into the skin daily at 10 pm.    . Insulin Pen Needle (ULTICARE MICRO PEN NEEDLES) 32G X 4 MM MISC 1 Syringe by Does not apply route 4 (four) times daily - after meals and at bedtime. 1 each 11  . Lancets (ACCU-CHEK SOFT TOUCH) lancets Use as instructed 100 each 12  . levothyroxine (SYNTHROID, LEVOTHROID) 50 MCG tablet Take 1 tablet (50 mcg total) by mouth daily. 30 tablet 2  . nitroGLYCERIN (NITROSTAT) 0.4 MG SL tablet Place 1 tablet (0.4 mg total) under the tongue every 5 (five) minutes as needed for chest pain. 25 tablet 3  . OXYGEN 2lpm 24/7 AHC    . potassium chloride SA (K-DUR,KLOR-CON) 20 MEQ tablet Take 1.5 tablets (30 mEq total) by mouth daily. 45 tablet 3  . torsemide (DEMADEX) 20 MG tablet Take 80 mg by mouth daily.    . vitamin B-12 1000 MCG tablet Take 1 tablet (1,000 mcg total) by mouth daily. 30 tablet 5  . warfarin (COUMADIN) 5 MG tablet Take 1.5 tablet (total 7.5 mg) tonight and tomorrow 02/09/16 at supper and check INR 02/10/16. 45 tablet 0  . bisoprolol (ZEBETA) 5 MG tablet Take 0.5 tablets (2.5 mg total) by mouth daily. 15 tablet 3   No current facility-administered medications for this encounter.     Filed Vitals:   03/01/16 1523  BP: 148/78  Pulse: 100  Weight: 231 lb 12 oz (105.121 kg)  SpO2: 93%   Wt Readings from Last 3 Encounters:  03/01/16 231 lb 12 oz (105.121 kg)  03/01/16 233 lb 3.2  oz (105.779 kg)  02/22/16 237 lb (107.502 kg)     General: NAD Neck: Thick, JVP 7 cm, no thyromegaly or thyroid nodule.  Lungs: Distant breath sounds bilaterally.  CV: Nondisplaced PMI.  Heart regular S1/S2, no S3/S4, no murmur.  No edema.  No carotid bruit.  Unable to palpate pedal pulses.  Trace right radial pulse, 2+ left radial pulse.  Abdomen: Soft, nontender, no hepatosplenomegaly, no distention.  Skin: Intact without lesions or rashes.  Neurologic: Alert and oriented x 3.  Psych: Normal affect. Extremities: No clubbing or cyanosis.  HEENT: Normal.   Assessment/Plan: 1. Chronic systolic CHF: EF 62% on last echo. Has been presumed nonischemic given LHC in 12/15 showing only 80% stenosis in small OM1.  Volume status looks considerably better today and weight is down  5 lbs.  More limited by claudication than dypsnea.  - Continue torsemide 80 mg daily.  - Restart low dose of bisoprolol, 2.5 mg daily.  - Off lisinopril with recent AKI.   - Try to limit dietary sodium.   2. Atrial fibrillation: Now in NSR on amiodarone.  She is on warfarin (taken off NOAC with fluctuation in creatinine).  - Continue amiodarone 100 daily.  PCP following hypothyroidism (on Levoxyl).  AST/ALT were normal in 3/17.  Will need regular eye exams. 3. PAD: Bilateral calf claudication seems quit limiting.  No rest pain or pedal ulcers.  Peripheral arterial dopplers show severe disease on right.  Not candidate for cilostazol with CHF.  Not good candidate for intervention per Dr Gwenlyn Found (seen recently).  4. COPD: On home oxygen.  Has quit smoking, needs to stay off cigarettes.  5. H/o cirrhosis: Likely from ETOH, no longer drinks.  6. Suspected OSA: Sleep study at end of month.   7. CKD: Creatinine 1.6 most recently. Off ACEI with recent AKI.  8. HTN: Restarting low dose bisoprolol.  9. Bradycardia: Junctional rhythm when hospitalized in 3/17.  This has resolved.  She is off diltiazem and bisoprolol.  Will try her  back on low dose bisoprolol 2.5 mg daily.  HR 100 bpm today. No lightheaded spells.   Followup in 1 month.  Loralie Champagne 03/02/2016

## 2016-03-07 ENCOUNTER — Telehealth: Payer: Self-pay | Admitting: Internal Medicine

## 2016-03-07 ENCOUNTER — Ambulatory Visit: Payer: Medicaid Other | Admitting: Surgery

## 2016-03-07 IMAGING — CR DG CHEST 2V
2 series · 2 of 2 positions shown · non-contrast
Comparison: June 14, 2015

CLINICAL DATA: Shortness of breath and chest pain

EXAM:
CHEST  2 VIEW

[chest pa]
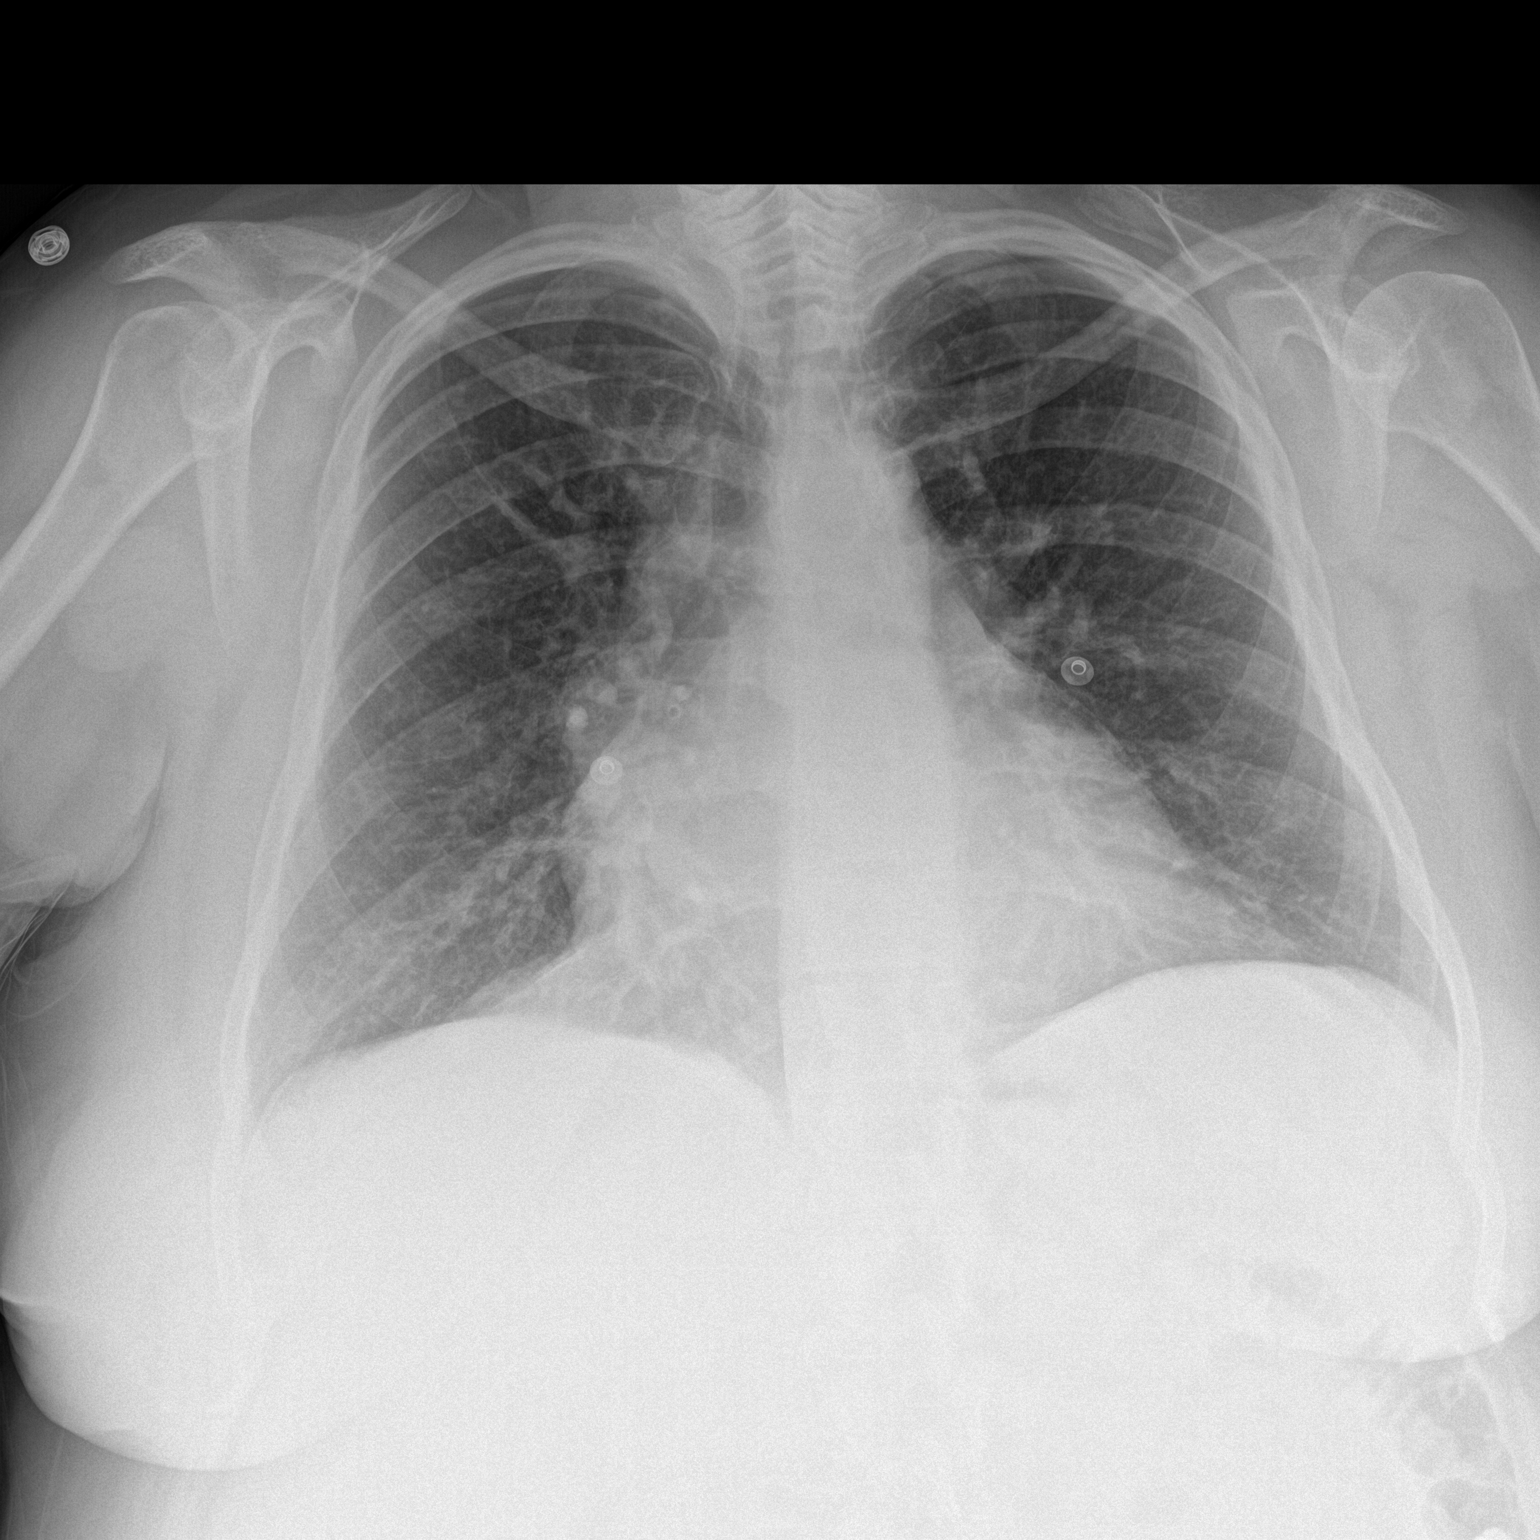

[chest lat]
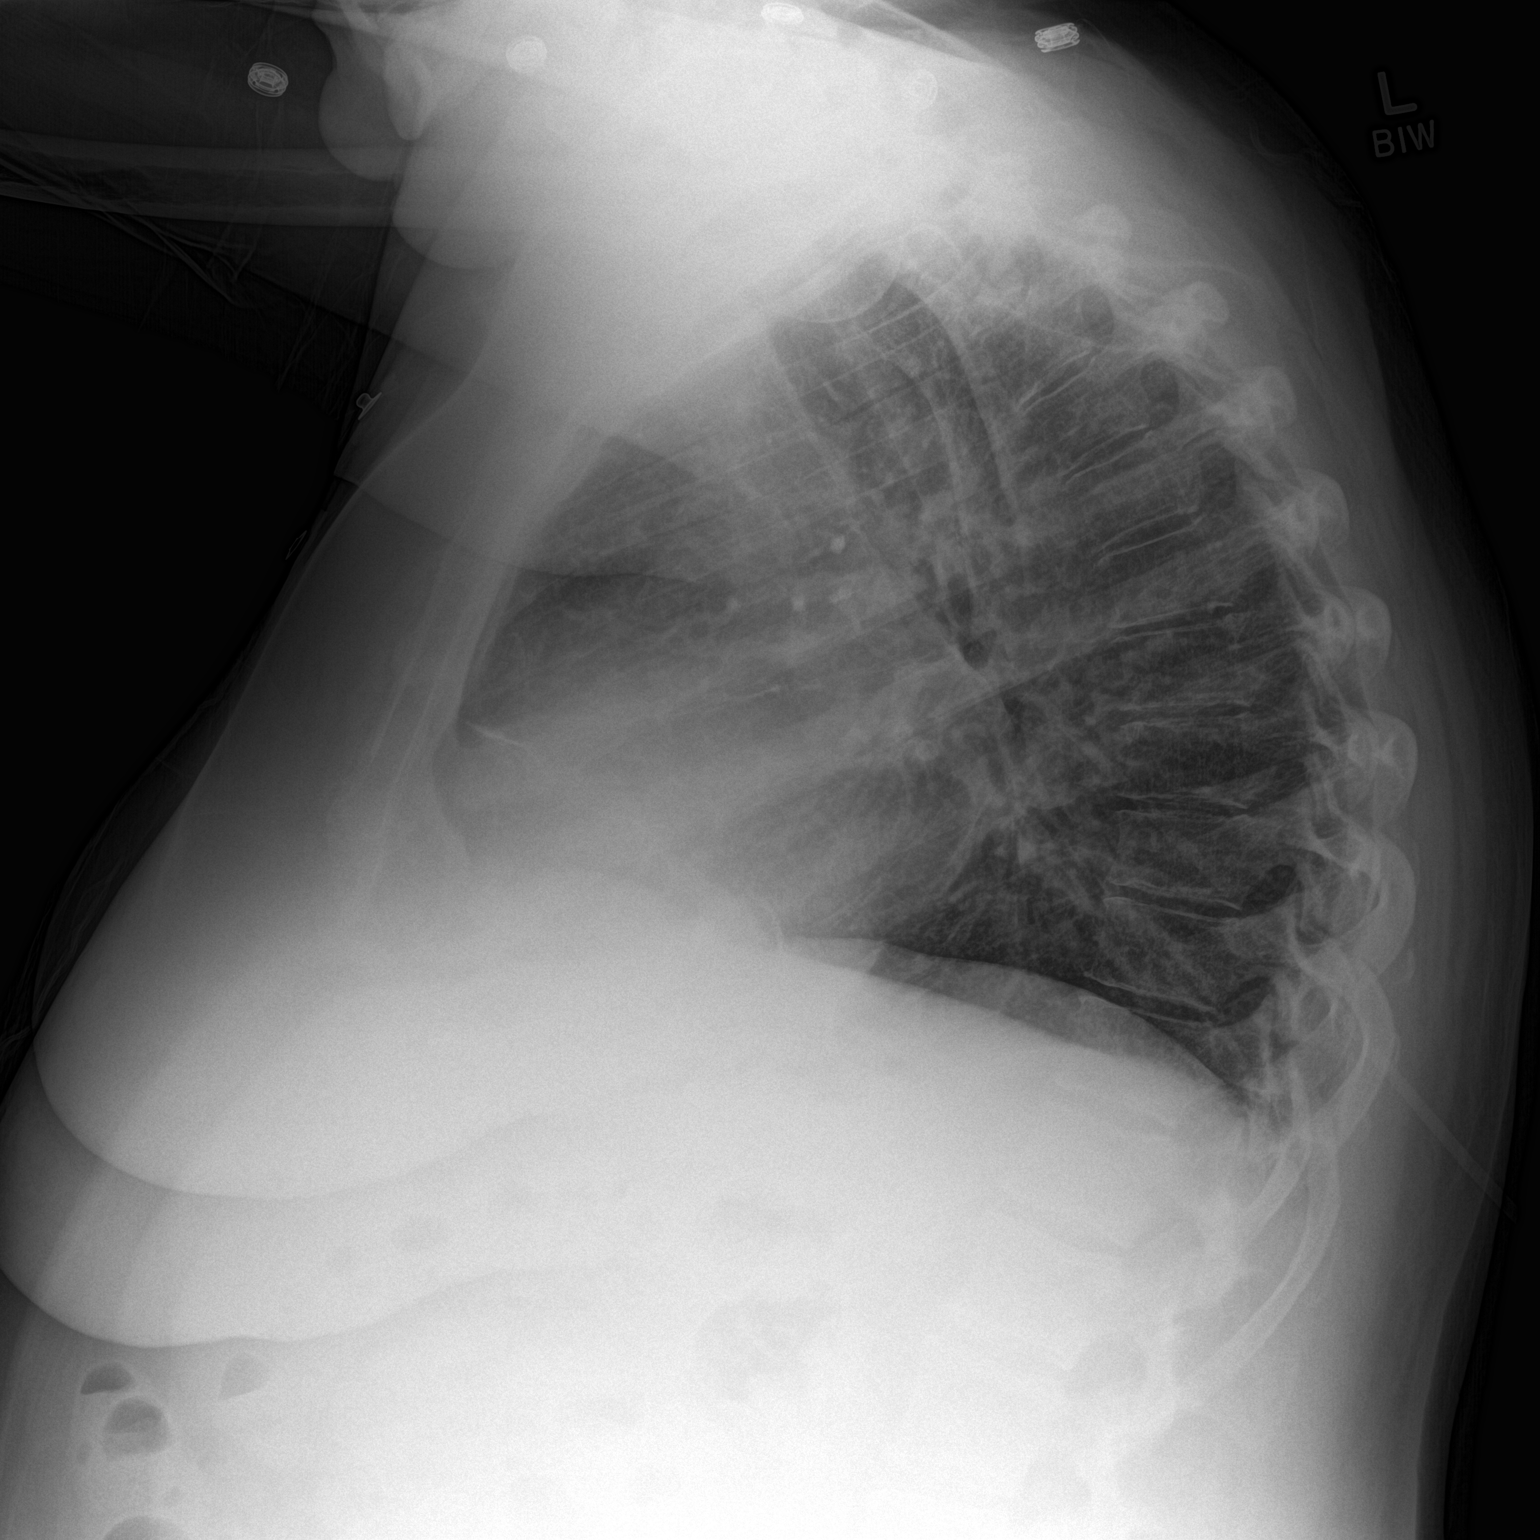

[2 of 2 positions shown; findings below may reference images not displayed]

FINDINGS: There is no edema or consolidation. Heart is upper normal in size
with pulmonary vascularity within normal limits. No adenopathy. No
bone lesions.
IMPRESSION: No edema or consolidation.

## 2016-03-07 NOTE — Telephone Encounter (Signed)
  Notes Recorded by [name], MD on 02/22/2016 at 1:23 PM Call pt: Reviewed cxr and no acute change so no change in recommendations made at ov  ATC, NA and VM not set up yet [facility]

## 2016-03-08 NOTE — Telephone Encounter (Signed)
Atc, no answer, no vm set up yet. wcb.

## 2016-03-08 NOTE — Telephone Encounter (Signed)
lmtcb x1 for pt. 

## 2016-03-08 NOTE — Telephone Encounter (Signed)
[phone], pt cb

## 2016-03-09 NOTE — Telephone Encounter (Signed)
ATC the number provided in message - no answer and VM is not set up yet Called the other number on file for pt was told that number was incorrect by person answering the phone Per the 4.10.17 cxr result note, a letter has been mailed to the pt Will sign off

## 2016-03-11 ENCOUNTER — Ambulatory Visit: Payer: Medicare Other | Admitting: Cardiovascular Disease

## 2016-03-11 ENCOUNTER — Encounter: Payer: Medicare Other | Admitting: Pharmacist Clinician (PhC)/ Clinical Pharmacy Specialist

## 2016-03-14 ENCOUNTER — Telehealth (HOSPITAL_COMMUNITY): Payer: Self-pay | Admitting: *Deleted

## 2016-03-14 NOTE — Telephone Encounter (Signed)
[name] with paramedicine called and said pts weight went up 3lba overnight.  No symptoms/bp stable.  I spoke with Dr.McLean who said she should take 2.5mg  of metolazone, extra dose of torsemide, and 20 of K today. Pt also needs to limit her fluid and salt intake. [name] aware and voiced understanding.

## 2016-03-15 ENCOUNTER — Ambulatory Visit (INDEPENDENT_AMBULATORY_CARE_PROVIDER_SITE_OTHER): Payer: Medicare Other | Admitting: Pharmacist Clinician (PhC)/ Clinical Pharmacy Specialist

## 2016-03-15 DIAGNOSIS — I4891 Unspecified atrial fibrillation: Secondary | ICD-10-CM

## 2016-03-15 DIAGNOSIS — Z5181 Encounter for therapeutic drug level monitoring: Secondary | ICD-10-CM | POA: Diagnosis not present

## 2016-03-15 DIAGNOSIS — I480 Paroxysmal atrial fibrillation: Secondary | ICD-10-CM

## 2016-03-15 LAB — POCT INR: INR: 3

## 2016-03-15 MED ORDER — WARFARIN SODIUM 5 MG PO TABS: ORAL_TABLET | ORAL | Status: DC

## 2016-03-18 IMAGING — CT CT CHEST W/ CM
2 of 5 series · 15 of 36 positions shown, 18 images · IV contrast (Omni 300)
Comparison: None.

CLINICAL DATA: Generalized chest and abdominal pain, fever, and
vomiting since yesterday. Generalized weakness.

EXAM:
CT CHEST, ABDOMEN, AND PELVIS WITH CONTRAST
TECHNIQUE: Multidetector CT imaging of the chest, abdomen and pelvis was
performed following the standard protocol during bolus
administration of intravenous contrast.
CONTRAST:  100mL OMNIPAQUE IOHEXOL 300 MG/ML  SOLN

[Series 2: cap 5.0 i31f 1 · axial · 0.88mm/px · z∈[+833,+1358]mm · 12 of 123 slices shown, 15 images]
[im 9/123  mediastinal]
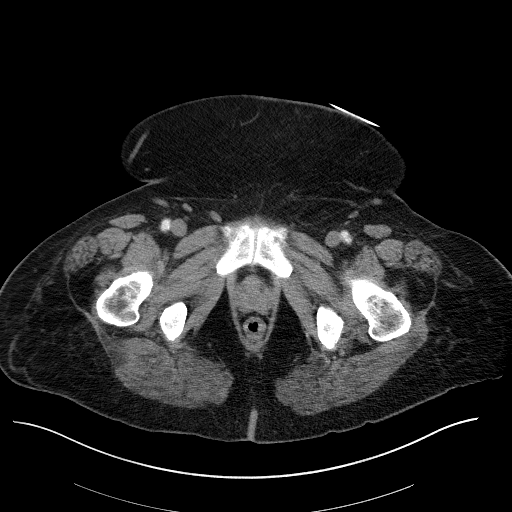
[im 9/123  lung]
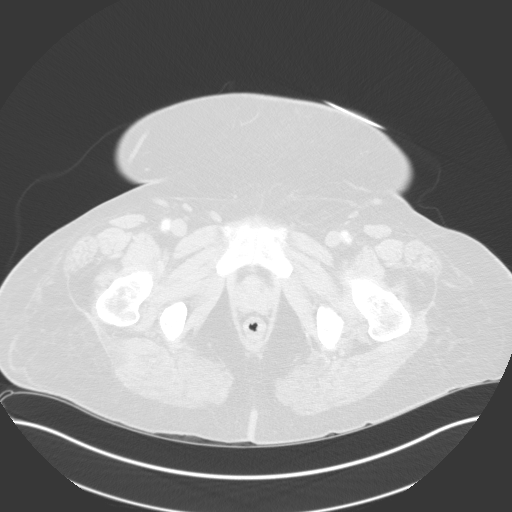
[im 17/123  lung]
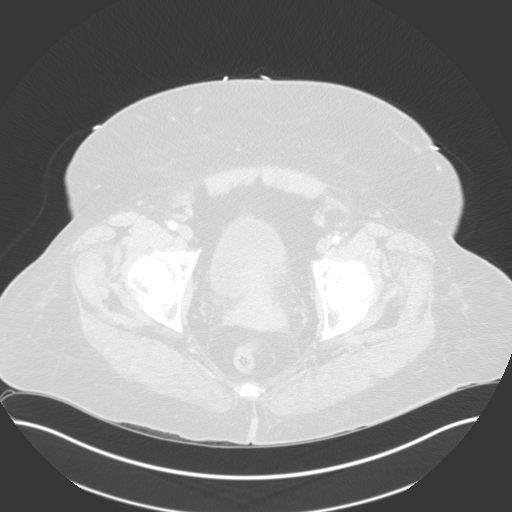
[im 25/123  lung]
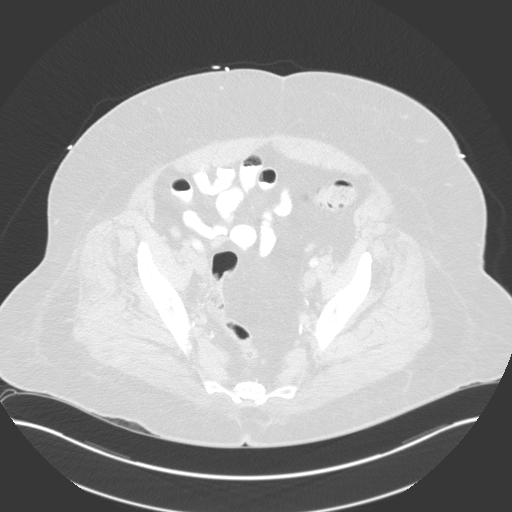
[im 41/123  lung]
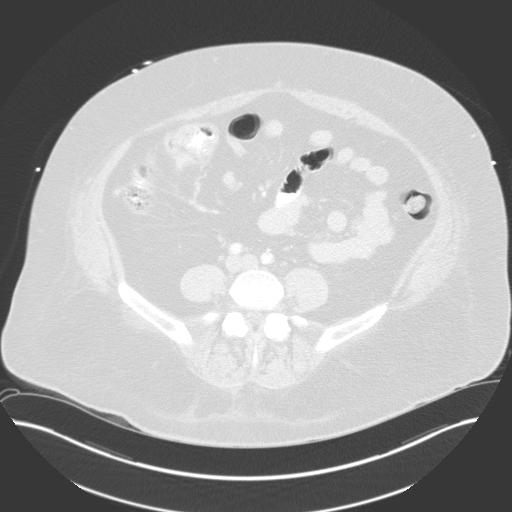
[im 49/123  mediastinal]
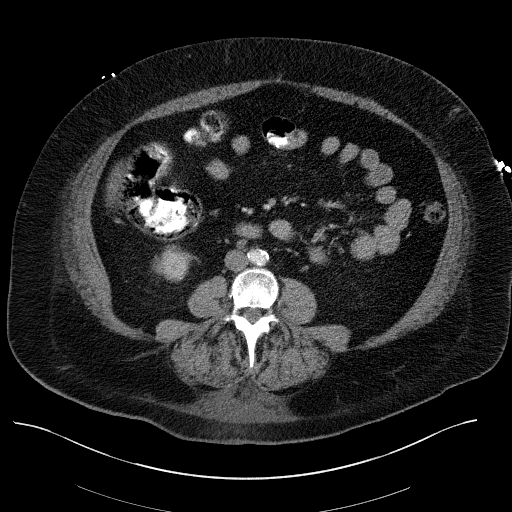
[im 49/123  lung]
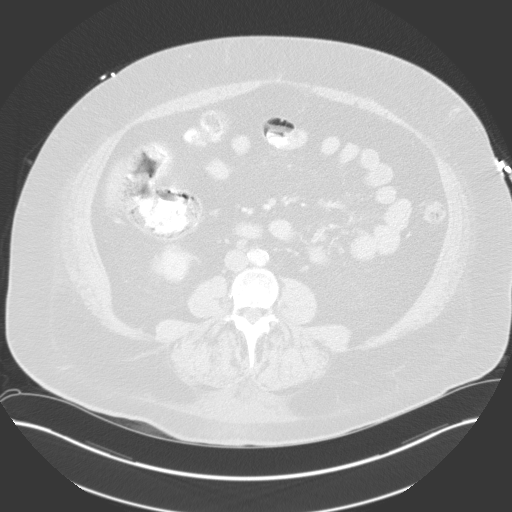
[im 57/123  lung]
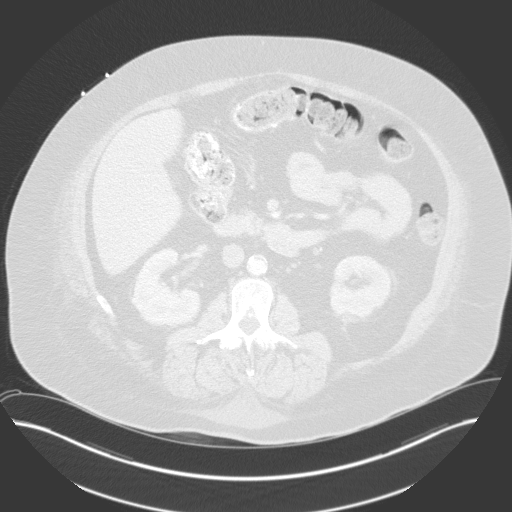
[im 66/123  lung]
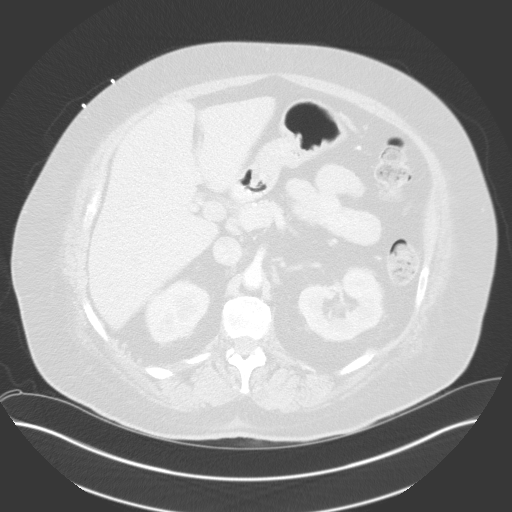
[im 74/123  lung]
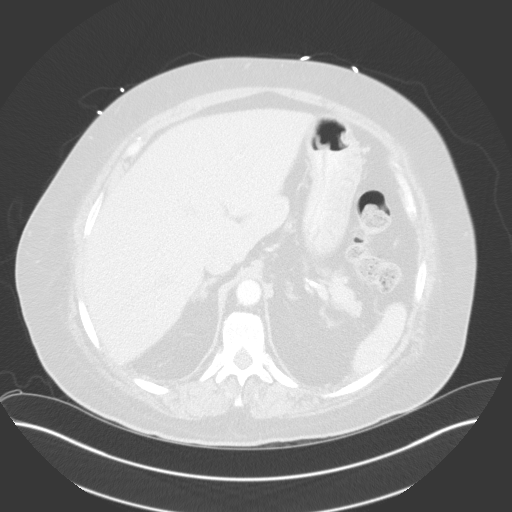
[im 82/123  mediastinal]
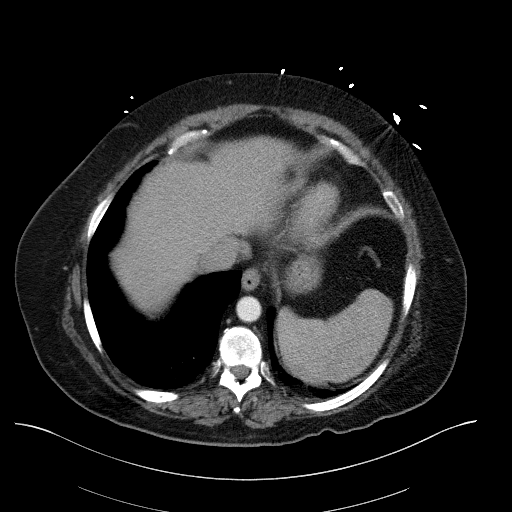
[im 82/123  lung]
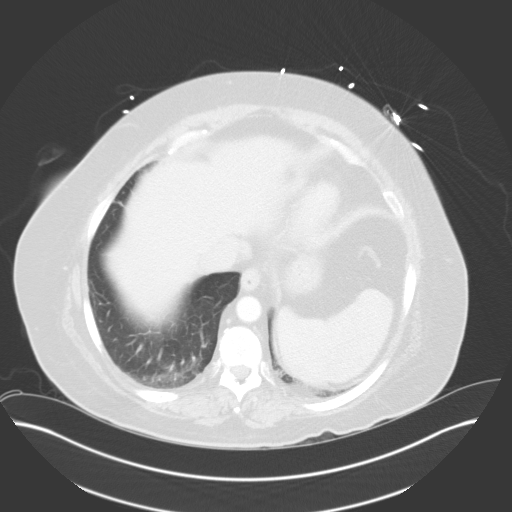
[im 98/123  lung]
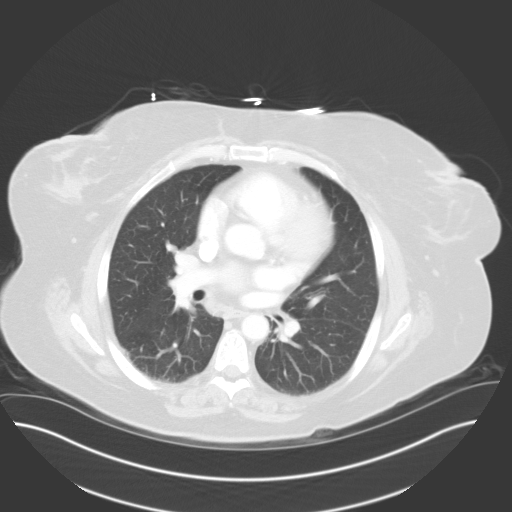
[im 106/123  lung]
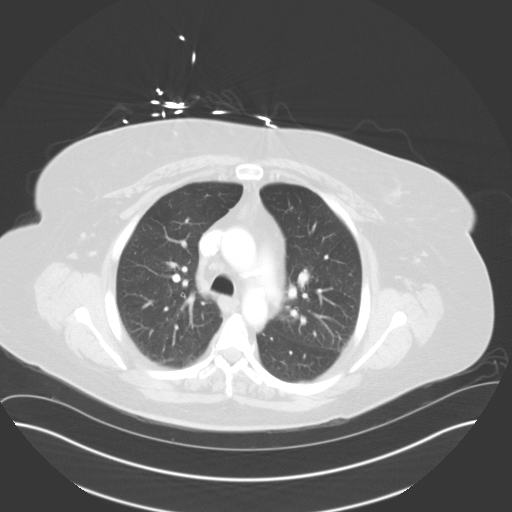
[im 114/123  lung]
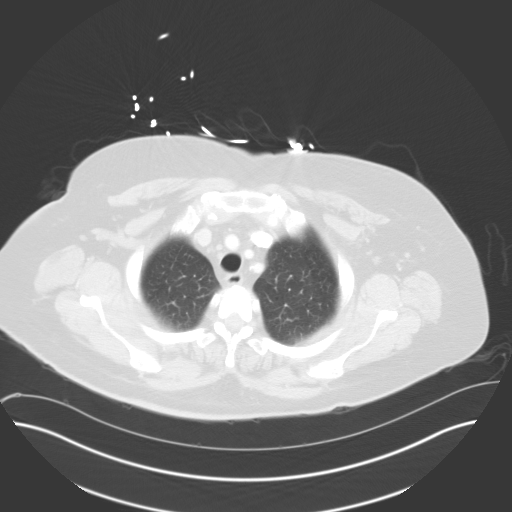

[Series 5: coronal · coronal · 0.93mm/px · 3 of 106 slices shown]
[im 22/106  lung]
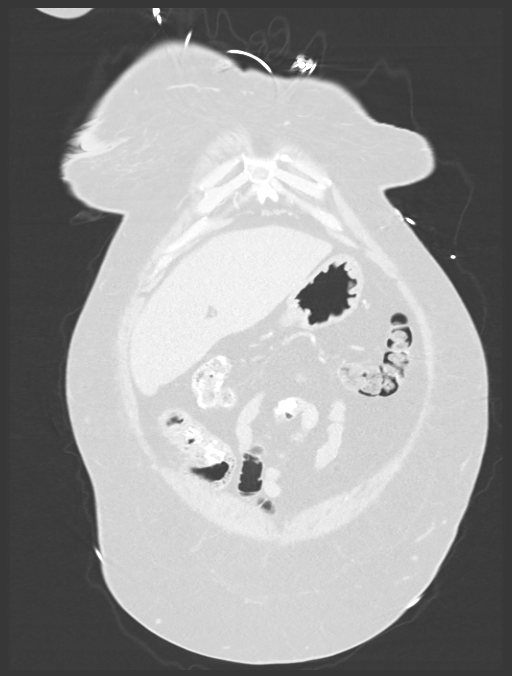
[im 43/106  lung]
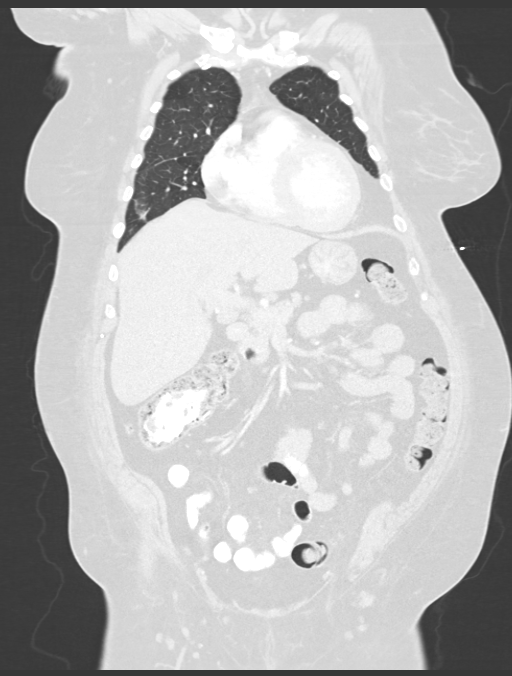
[im 64/106  lung]
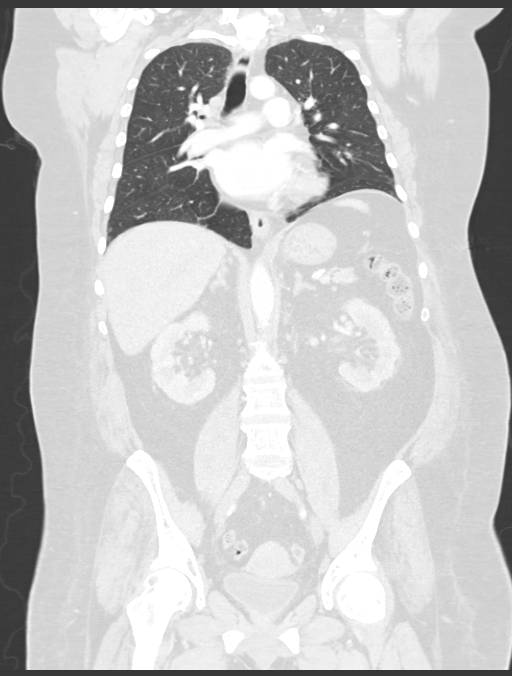

[15 of 36 positions shown; findings below may reference images not displayed]

FINDINGS: CT CHEST FINDINGS

Mediastinum/Lymph Nodes: No masses or pathologically enlarged lymph
nodes identified.

Lungs/Pleura: No pulmonary infiltrate or mass identified. No
effusion present.

Musculoskeletal/Soft Tissues: No suspicious bone lesions or other
significant chest wall abnormality.

CT ABDOMEN AND PELVIS FINDINGS

Hepatobiliary: No masses or other significant abnormality
identified. Gallbladder is unremarkable.

Pancreas: No mass, inflammatory changes, or other significant
abnormality identified.

Spleen:  Within normal limits in size and appearance.

Adrenals:  No masses identified.

Kidneys/Urinary Tract: A 1.5 cm benign angiomyolipoma is seen in the
anterior lower pole of the right kidney. No other renal masses are
identified. No evidence of hydronephrosis.

Stomach/Bowel/Peritoneum: No evidence of wall thickening, mass, or
obstruction. Normal appendix visualized.

Vascular/Lymphatic: No pathologically enlarged lymph nodes
identified. No abdominal aortic aneurysm or other significant
retroperitoneal abnormality demonstrated.

Reproductive:  No mass or other significant abnormality identified.

Other:  None.

Musculoskeletal:  No suspicious bone lesions identified.
IMPRESSION: No acute findings within the chest, abdomen, or pelvis.

1.5 cm benign right renal angiomyolipoma incidentally noted.

## 2016-03-30 ENCOUNTER — Ambulatory Visit (INDEPENDENT_AMBULATORY_CARE_PROVIDER_SITE_OTHER): Payer: Medicare Other | Admitting: Pharmacist Clinician (PhC)/ Clinical Pharmacy Specialist

## 2016-03-30 DIAGNOSIS — Z5181 Encounter for therapeutic drug level monitoring: Secondary | ICD-10-CM | POA: Diagnosis not present

## 2016-03-30 DIAGNOSIS — I4891 Unspecified atrial fibrillation: Secondary | ICD-10-CM

## 2016-03-30 DIAGNOSIS — I480 Paroxysmal atrial fibrillation: Secondary | ICD-10-CM | POA: Diagnosis not present

## 2016-03-30 LAB — POCT INR: INR: 5.3

## 2016-04-04 ENCOUNTER — Encounter (HOSPITAL_COMMUNITY): Payer: Medicare Other

## 2016-04-05 ENCOUNTER — Telehealth (HOSPITAL_COMMUNITY): Payer: Self-pay | Admitting: *Deleted

## 2016-04-05 DIAGNOSIS — I5043 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) and diastolic (congestive) heart failure: Secondary | ICD-10-CM

## 2016-04-05 MED ORDER — POTASSIUM CHLORIDE CRYS ER 20 MEQ PO TBCR: 30.0000 meq | EXTENDED_RELEASE_TABLET | Freq: Every day | ORAL | Status: DC

## 2016-04-05 MED ORDER — TORSEMIDE 20 MG PO TABS: 80.0000 mg | ORAL_TABLET | Freq: Every day | ORAL | Status: DC

## 2016-04-05 NOTE — Telephone Encounter (Signed)
[name] is seeing pt today and reports wt is up 16 lbs.  Pt's wt was 229 lbs on 5/1 and she was 245 lbs today.  [name] reports pt has bilat LE edema up to her knees and is SOB w/exertion.  Pt has been taking meds as directed including Tor 80 mg daily.  idscussed w/[name] Ninfa Meeker, NP will have pt take Torsemide 80 mg BID for 2 days along w/KCL 30 meq BID for 2 days.  [name] aware and will advise pt, she will see pt again on Thur to f/u on how she is doing, f/u appt sch w/us on 5/31

## 2016-04-06 ENCOUNTER — Ambulatory Visit (HOSPITAL_COMMUNITY)
Admission: RE | Admit: 2016-04-06 | Discharge: 2016-04-06 | Disposition: A | Discharge status: Home / Self Care | Payer: Medicare Other | Source: Ambulatory Visit | Attending: Cardiovascular Disease | Admitting: Cardiovascular Disease

## 2016-04-06 ENCOUNTER — Ambulatory Visit (INDEPENDENT_AMBULATORY_CARE_PROVIDER_SITE_OTHER): Payer: Medicare Other | Admitting: Pharmacist Clinician (PhC)/ Clinical Pharmacy Specialist

## 2016-04-06 DIAGNOSIS — I4891 Unspecified atrial fibrillation: Secondary | ICD-10-CM | POA: Diagnosis not present

## 2016-04-06 DIAGNOSIS — I110 Hypertensive heart disease with heart failure: Secondary | ICD-10-CM | POA: Diagnosis not present

## 2016-04-06 DIAGNOSIS — I6523 Occlusion and stenosis of bilateral carotid arteries: Secondary | ICD-10-CM | POA: Diagnosis not present

## 2016-04-06 DIAGNOSIS — K219 Gastro-esophageal reflux disease without esophagitis: Secondary | ICD-10-CM | POA: Diagnosis not present

## 2016-04-06 DIAGNOSIS — Z5181 Encounter for therapeutic drug level monitoring: Secondary | ICD-10-CM

## 2016-04-06 DIAGNOSIS — I2510 Atherosclerotic heart disease of native coronary artery without angina pectoris: Secondary | ICD-10-CM | POA: Insufficient documentation

## 2016-04-06 DIAGNOSIS — I739 Peripheral vascular disease, unspecified: Secondary | ICD-10-CM

## 2016-04-06 DIAGNOSIS — I5042 Chronic combined systolic (congestive) and diastolic (congestive) heart failure: Secondary | ICD-10-CM | POA: Diagnosis not present

## 2016-04-06 DIAGNOSIS — I480 Paroxysmal atrial fibrillation: Secondary | ICD-10-CM | POA: Diagnosis not present

## 2016-04-06 DIAGNOSIS — E1142 Type 2 diabetes mellitus with diabetic polyneuropathy: Secondary | ICD-10-CM | POA: Insufficient documentation

## 2016-04-06 DIAGNOSIS — E785 Hyperlipidemia, unspecified: Secondary | ICD-10-CM | POA: Diagnosis not present

## 2016-04-06 DIAGNOSIS — E1169 Type 2 diabetes mellitus with other specified complication: Secondary | ICD-10-CM | POA: Diagnosis not present

## 2016-04-06 DIAGNOSIS — I779 Disorder of arteries and arterioles, unspecified: Secondary | ICD-10-CM | POA: Insufficient documentation

## 2016-04-06 LAB — POCT INR: INR: 2

## 2016-04-07 ENCOUNTER — Other Ambulatory Visit: Payer: Self-pay | Admitting: *Deleted

## 2016-04-07 DIAGNOSIS — I739 Peripheral vascular disease, unspecified: Principal | ICD-10-CM

## 2016-04-07 DIAGNOSIS — I779 Disorder of arteries and arterioles, unspecified: Secondary | ICD-10-CM

## 2016-04-11 ENCOUNTER — Ambulatory Visit (HOSPITAL_BASED_OUTPATIENT_CLINIC_OR_DEPARTMENT_OTHER): Payer: Medicare Other | Attending: Cardiology

## 2016-04-12 ENCOUNTER — Other Ambulatory Visit: Payer: Self-pay | Admitting: Cardiovascular Disease

## 2016-04-12 DIAGNOSIS — I739 Peripheral vascular disease, unspecified: Secondary | ICD-10-CM

## 2016-04-13 ENCOUNTER — Ambulatory Visit (HOSPITAL_COMMUNITY)
Admission: RE | Admit: 2016-04-13 | Discharge: 2016-04-13 | Disposition: A | Discharge status: Home / Self Care | Payer: Medicare Other | Source: Ambulatory Visit | Attending: Internal Medicine | Admitting: Internal Medicine

## 2016-04-13 ENCOUNTER — Encounter: Payer: Self-pay | Admitting: Licensed Clinical Social Worker

## 2016-04-13 VITALS — BP 148/70 | HR 79 | Wt 242.0 lb

## 2016-04-13 DIAGNOSIS — K7460 Unspecified cirrhosis of liver: Secondary | ICD-10-CM | POA: Insufficient documentation

## 2016-04-13 DIAGNOSIS — J449 Chronic obstructive pulmonary disease, unspecified: Secondary | ICD-10-CM | POA: Insufficient documentation

## 2016-04-13 DIAGNOSIS — E039 Hypothyroidism, unspecified: Secondary | ICD-10-CM | POA: Diagnosis not present

## 2016-04-13 DIAGNOSIS — E1151 Type 2 diabetes mellitus with diabetic peripheral angiopathy without gangrene: Secondary | ICD-10-CM | POA: Diagnosis not present

## 2016-04-13 DIAGNOSIS — Z8249 Family history of ischemic heart disease and other diseases of the circulatory system: Secondary | ICD-10-CM | POA: Diagnosis not present

## 2016-04-13 DIAGNOSIS — N183 Chronic kidney disease, stage 3 (moderate): Secondary | ICD-10-CM | POA: Diagnosis not present

## 2016-04-13 DIAGNOSIS — Z79899 Other long term (current) drug therapy: Secondary | ICD-10-CM | POA: Diagnosis not present

## 2016-04-13 DIAGNOSIS — E1122 Type 2 diabetes mellitus with diabetic chronic kidney disease: Secondary | ICD-10-CM | POA: Insufficient documentation

## 2016-04-13 DIAGNOSIS — I5022 Chronic systolic (congestive) heart failure: Secondary | ICD-10-CM | POA: Insufficient documentation

## 2016-04-13 DIAGNOSIS — I480 Paroxysmal atrial fibrillation: Secondary | ICD-10-CM | POA: Diagnosis not present

## 2016-04-13 DIAGNOSIS — Z794 Long term (current) use of insulin: Secondary | ICD-10-CM | POA: Diagnosis not present

## 2016-04-13 DIAGNOSIS — I5043 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) and diastolic (congestive) heart failure: Secondary | ICD-10-CM

## 2016-04-13 DIAGNOSIS — Z87891 Personal history of nicotine dependence: Secondary | ICD-10-CM | POA: Diagnosis not present

## 2016-04-13 DIAGNOSIS — I2510 Atherosclerotic heart disease of native coronary artery without angina pectoris: Secondary | ICD-10-CM

## 2016-04-13 DIAGNOSIS — I739 Peripheral vascular disease, unspecified: Secondary | ICD-10-CM | POA: Diagnosis not present

## 2016-04-13 DIAGNOSIS — I130 Hypertensive heart and chronic kidney disease with heart failure and stage 1 through stage 4 chronic kidney disease, or unspecified chronic kidney disease: Secondary | ICD-10-CM | POA: Insufficient documentation

## 2016-04-13 DIAGNOSIS — Z9981 Dependence on supplemental oxygen: Secondary | ICD-10-CM | POA: Diagnosis not present

## 2016-04-13 DIAGNOSIS — Z7901 Long term (current) use of anticoagulants: Secondary | ICD-10-CM | POA: Diagnosis not present

## 2016-04-13 LAB — BASIC METABOLIC PANEL
Anion gap: 12 (ref 5–15)
BUN: 40 mg/dL — AB (ref 6–20)
CHLORIDE: 101 mmol/L (ref 101–111)
CO2: 21 mmol/L — ABNORMAL LOW (ref 22–32)
CREATININE: 1.44 mg/dL — AB (ref 0.44–1.00)
Calcium: 9.1 mg/dL (ref 8.9–10.3)
GFR calc Af Amer: 47 mL/min — ABNORMAL LOW (ref >60)
GFR, EST NON AFRICAN AMERICAN: 41 mL/min — AB (ref >60)
Glucose, Bld: 278 mg/dL — ABNORMAL HIGH (ref 65–99)
Potassium: 4.8 mmol/L (ref 3.5–5.1)
SODIUM: 134 mmol/L — AB (ref 135–145)

## 2016-04-13 LAB — MAGNESIUM: MAGNESIUM: 1.8 mg/dL (ref 1.7–2.4)

## 2016-04-13 LAB — BRAIN NATRIURETIC PEPTIDE: B Natriuretic Peptide: 76.2 pg/mL (ref 0.0–100.0)

## 2016-04-13 NOTE — Progress Notes (Signed)
Patient ID: [name], female   DOB: Mar 17, 1961, 55 y.o.   MRN: [id number]    Advanced Heart Failure Clinic Note   PCP: Dr Roxy Manns HF Cardiology: Dr. Aundra Dubin PV: Dr. Gwenlyn Found  55 yo with history of PAD, carotid stenosis s/p left CEA, relatively mild CAD, chronic systolic CHF, paroxysmal atrial fibrillation and prior substance abuse presents for CHF clinic followup.  She was admitted in 1/17 with acute hypoxemic respiratory failure in the setting of atrial fibrillation/RVR and volume overload.  She was initially intubated.  She converted back to NSR with amiodarone gtt.  She was treated with IV Lasix, steroids, bronchodilators.  She developed AKI and losartan was stopped.    She was admitted in 3/17 with symptomatic bradycardia.  She was supposed to stop diltiazem after this appointment but continued to take it.  She presented later in 3/17, again with symptomatic bradycardia (junctional bradycardia), hypotension, and AKI.  Diltiazem and bisoprolol were stopped.  HR recovered and is actually elevated today, rate around 100 bpm.  ACEI was stopped with AKI.   She presents today for regular follow up.  Recently had to take extra torsemide + metolazone for weights at home up to 245.  Has come down into lower 240s. 242 today, same as clinic weight, although she feels a lot better. Taking all other medications as directing. Paramedicine is following her.  Denies lightheadedness or dizziness on bisoprolol. Still can only walk about 3 minutes prior to her leg pain forcing her to stop.  No SOB, but legs give out prior to that. No CP. No orthopnea/PND. Dog scratched her leg about a month ago and has healed slowly. No fever, chills, or purulence. Has Endocrine follow up next week.   Labs (1/17): K 5.2, creatinine 1.4, AST 43, ALT normal Labs (2/17): K 4.2, creatinine 1.3, BNP 607, TSH normal Labs (3/17): K 4.2, creatinine 1.45, AST/ALT normal, HCT 28.7 Labs (4/17): K 4.4, creatinine 1.61  PMH: 1. Carotid  stenosis: Known occluded right carotid.  Left CEA in 4/16.  2. CAD: LHC in 12/15 with 80% stenosis in small OM1, nonobstructive disease in other territories.  3. Chronic systolic CHF: Nonischemic cardiomyopathy (?due to ETOH or prior drug abuse).  - Echo (1/17) with EF 45%, mild LV hypertrophy, moderate diastolic dysfunction, inferolateral severe hypokinesis, mildly decreased RV systolic function.  4. Atrial fibrillation: Paroxysmal.   5. Type II diabetes 6. CKD: Stage III. 7. COPD: Quit smoking 1/17. On home oxygen.  8. 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  No longer drinks.  9. Hypothyroidism 10. PAD: Atherectomy SFA in 2014 (Dr Gwenlyn Found).  Peripheral arterial dopplers (2/17) with focal 75-99% proximal right SFA stenosis, occluded mid-distal right SFA, chronic occlusion of all runoff arteries on right. 11. Anemia 12. Prior cocaine abuse.  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   SH: Lives with sister.  Prior cocaine abuse.  Prior ETOH abuse.  Quit smoking in 1/17.  FH: CAD  ROS: All systems reviewed and negative except as per HPI.   Current Outpatient Prescriptions  Medication Sig Dispense Refill  . acetaminophen-codeine (TYLENOL #3) 300-30 MG tablet Take 1 tablet by mouth 2 (two) times daily.    [name] albuterol (PROVENTIL HFA;VENTOLIN HFA) 108 (90 Base) MCG/ACT inhaler Inhale 2 puffs into the lungs every 6 (six) hours as needed for wheezing or shortness of breath. 1 each 3  . albuterol (PROVENTIL) (2.5 MG/3ML) 0.083% nebulizer solution Take 3 mLs (2.5 mg total) by nebulization every  6 (six) hours as needed. For wheezing. 75 mL 2  . amiodarone (PACERONE) 200 MG tablet Take 100 mg by mouth daily.    [name] amLODipine (NORVASC) 10 MG tablet Take 1 tablet (10 mg total) by mouth daily. 30 tablet 6  . atorvastatin (LIPITOR) 40 MG tablet Take 1 tablet (40 mg total) by mouth daily. 30 tablet 2  . bisoprolol (ZEBETA) 5 MG tablet Take 0.5 tablets (2.5 mg total) by mouth  daily. 15 tablet 3  . Blood Glucose Monitoring Suppl (ACCU-CHEK AVIVA PLUS) W/DEVICE KIT 1 Device by Does not apply route 4 (four) times daily -  before meals and at bedtime. 1 kit 0  . budesonide-formoterol (SYMBICORT) 160-4.5 MCG/ACT inhaler Inhale 2 puffs into the lungs 2 (two) times daily. 1 Inhaler 12  . cholecalciferol (VITAMIN D) 1000 units tablet Take 1,000 Units by mouth daily.    . folic acid (FOLVITE) 1 MG tablet Take 1 tablet (1 mg total) by mouth daily. 30 tablet 5  . gabapentin (NEURONTIN) 300 MG capsule Take 300 mg by mouth 4 (four) times daily.    . insulin aspart (NOVOLOG FLEXPEN) 100 UNIT/ML FlexPen Inject 17-32 Units into the skin 3 (three) times daily with meals.    . Insulin Glargine (LANTUS SOLOSTAR) 100 UNIT/ML Solostar Pen Inject 50 Units into the skin daily at 10 pm.    . Insulin Pen Needle (ULTICARE MICRO PEN NEEDLES) 32G X 4 MM MISC 1 Syringe by Does not apply route 4 (four) times daily - after meals and at bedtime. 1 each 11  . Lancets (ACCU-CHEK SOFT TOUCH) lancets Use as instructed 100 each 12  . levothyroxine (SYNTHROID, LEVOTHROID) 50 MCG tablet Take 1 tablet (50 mcg total) by mouth daily. 30 tablet 2  . nitroGLYCERIN (NITROSTAT) 0.4 MG SL tablet Place 1 tablet (0.4 mg total) under the tongue every 5 (five) minutes as needed for chest pain. 25 tablet 3  . OXYGEN 2lpm 24/7 AHC    . potassium chloride SA (K-DUR,KLOR-CON) 20 MEQ tablet Take 1.5 tablets (30 mEq total) by mouth daily. Take an extra 1.5 tabs as directed 60 tablet 3  . torsemide (DEMADEX) 20 MG tablet Take 4 tablets (80 mg total) by mouth daily. Take extra 4 tabs as directed 180 tablet 3  . vitamin B-12 1000 MCG tablet Take 1 tablet (1,000 mcg total) by mouth daily. 30 tablet 5  . warfarin (COUMADIN) 5 MG tablet Take 1-1.5 tablets by mouth daily as directed by coumadin clinic 45 tablet 3   No current facility-administered medications for this encounter.     Filed Vitals:   04/13/16 1437  BP: 148/70    Pulse: 79  Weight: 242 lb (109.77 kg)  SpO2: 95%   Wt Readings from Last 3 Encounters:  04/13/16 242 lb (109.77 kg)  03/01/16 231 lb 12 oz (105.121 kg)  03/01/16 233 lb 3.2 oz (105.779 kg)     General: NAD Neck: Thick, JVP 7-8 cm, no thyromegaly or thyroid nodule.  Lungs: Distant breath sounds bilaterally.  CV: Nondisplaced PMI.  Heart regular S1/S2, no S3/S4, no murmur.  No carotid bruit.  Unable to palpate pedal pulses.  Trace right radial pulse, 2+ left radial pulse.  Abdomen: Obese, soft, NT, ND, no HSM. No bruits or masses. +BS  Skin: Intact without lesions or rashes.  Neurologic: Alert and oriented x 3.  Psych: Normal affect. Extremities: No clubbing or cyanosis. Trace - 1+ edema up to mid calf. Several wounds LLE with eschar.  HEENT: Normal.   Assessment/Plan: 1. Chronic systolic CHF: EF 98% on last echo. Has been presumed nonischemic given LHC in 12/15 showing only 80% stenosis in small OM1.  Volume status remains up slightly. Weight up 9 lbs from last visit. NYHA Class II-III. More limited by claudication than dypsnea.  - Continue torsemide 80 mg daily. Can take extra 80 torsemide with extra 20 meq potassium as needed for weight gain swelling. Will have her take extra for the next 2 days.  - Continue bisoprolol 2.5 mg daily.  - Off lisinopril with recent AKI.   - Encouraged to limit fluid and sodium intake.  2. Atrial fibrillation: Now in NSR on amiodarone.  She is on warfarin (taken off NOAC with fluctuation in creatinine).  - Continue amiodarone 100 daily.  PCP following hypothyroidism (on Levoxyl).  AST/ALT were normal in 3/17.  Will need regular eye exams. 3. PAD: Bilateral calf claudication seems quit limiting.  No rest pain or pedal ulcers.  Peripheral arterial dopplers show severe disease on right.  Not candidate for cilostazol with CHF.  Not good candidate for intervention per Dr Gwenlyn Found (seen recently).  4. COPD: On home oxygen.  Has quit smoking, needs to stay off  cigarettes.  5. H/o cirrhosis: Likely from ETOH, no longer drinks.  6. Suspected OSA: Sleep study at end of month.   7. CKD: Creatinine 1.6 most recently. Off ACEI with recent AKI.  8. HTN:  - low dose bisoprolol.  9. Bradycardia: Junctional rhythm when hospitalized in 3/17.  This has resolved.  She is off diltiazem and bisoprolol.   - Stable thus far on low dose bisoprolol 2.5 mg daily.  HR 79 bpm today.  10. Poorly healing leg wounds - Sees diabetes MD next week. Encouraged to follow up with PCP as soon as able as well.  Take extra torsemide and extra potassium for next 2 days. Followup 4 weeks. BMET, BNP, and Mg today.  Recheck BMET next week.   Satira Mccallum Lukis Bunt PA-C 04/13/2016

## 2016-04-13 NOTE — Progress Notes (Signed)
Paramedicine Multidisciplinary Team Update  [name] is enrolled in the [employer] Paramedicine Program through [facility].  The patient presents today in association with an [facility] Appointment.  Patient living/home environment and social support-- Patient currently resides with her sister in a motel. She states they have been there for almost a year and have been unable to locate something permanent due to limited finances. Patient reports she receives SSD $1,029.00 monthly but her sister still has a pending disability application. They currently spend close to $950.00 monthly on motel room leaving little to no money for other expenses. Patient reports they get most of there food from [place].  Insurance/ Prescription Coverage-- Patient reports she has Medicare A&B and Medicaid ([employer])  Does the patient have a scale and weigh each day? Yes and reviews with paramedic on weekly visits  Does the patient follow a low salt diet? "tries to" Goes to the food bank  Is patient compliant with medications?  Yes  Does the patient have transportation for physician appointments? Yes- sister drives  Does the patient contact the HF Clinic appropriately with worsening symptoms or weight increases? yes  Do you have an Advanced Directive? CSW discussed at length and encouraged patient to complete. Patient does not read well and states she plans to think about it and will complete with CSW on next visit.  Are there any identified obstacles / challenges for adherence to current treatment plan? CSW provided patient with a list of housing options through [email or web address] Patient grateful for the list and plans to follow up with possible options with her sister.   CSW will continue to follow for continuity of care with paramedicine. Raquel Sarna, LCSW [phone]

## 2016-04-13 NOTE — Patient Instructions (Signed)
Please take an additional torsemide and potassium for the next 2 days  Labs today and again in 1 week  Your physician recommends that you schedule a follow-up appointment in: 4 weeks  Do the following things EVERYDAY: 1) Weigh yourself in the morning before breakfast. Write it down and keep it in a log. 2) Take your medicines as prescribed 3) Eat low salt foods-Limit salt (sodium) to 2000 mg per day.  4) Stay as active as you can everyday Limit all fluids for the day to less than 2 liters

## 2016-04-15 ENCOUNTER — Ambulatory Visit (INDEPENDENT_AMBULATORY_CARE_PROVIDER_SITE_OTHER): Payer: Medicare Other | Admitting: Pharmacist

## 2016-04-15 ENCOUNTER — Ambulatory Visit (HOSPITAL_COMMUNITY)
Admission: RE | Admit: 2016-04-15 | Discharge: 2016-04-15 | Disposition: A | Discharge status: Home / Self Care | Payer: Medicare Other | Source: Ambulatory Visit | Attending: Cardiovascular Disease | Admitting: Cardiovascular Disease

## 2016-04-15 DIAGNOSIS — E1140 Type 2 diabetes mellitus with diabetic neuropathy, unspecified: Secondary | ICD-10-CM | POA: Insufficient documentation

## 2016-04-15 DIAGNOSIS — I739 Peripheral vascular disease, unspecified: Secondary | ICD-10-CM | POA: Insufficient documentation

## 2016-04-15 DIAGNOSIS — I2510 Atherosclerotic heart disease of native coronary artery without angina pectoris: Secondary | ICD-10-CM | POA: Diagnosis not present

## 2016-04-15 DIAGNOSIS — R938 Abnormal findings on diagnostic imaging of other specified body structures: Secondary | ICD-10-CM | POA: Diagnosis not present

## 2016-04-15 DIAGNOSIS — I4891 Unspecified atrial fibrillation: Secondary | ICD-10-CM

## 2016-04-15 DIAGNOSIS — I110 Hypertensive heart disease with heart failure: Secondary | ICD-10-CM | POA: Diagnosis not present

## 2016-04-15 DIAGNOSIS — E785 Hyperlipidemia, unspecified: Secondary | ICD-10-CM | POA: Diagnosis not present

## 2016-04-15 DIAGNOSIS — I5042 Chronic combined systolic (congestive) and diastolic (congestive) heart failure: Secondary | ICD-10-CM | POA: Diagnosis not present

## 2016-04-15 DIAGNOSIS — K219 Gastro-esophageal reflux disease without esophagitis: Secondary | ICD-10-CM | POA: Insufficient documentation

## 2016-04-15 DIAGNOSIS — F419 Anxiety disorder, unspecified: Secondary | ICD-10-CM | POA: Diagnosis not present

## 2016-04-15 DIAGNOSIS — I480 Paroxysmal atrial fibrillation: Secondary | ICD-10-CM

## 2016-04-15 DIAGNOSIS — Z720 Tobacco use: Secondary | ICD-10-CM | POA: Diagnosis not present

## 2016-04-15 DIAGNOSIS — Z5181 Encounter for therapeutic drug level monitoring: Secondary | ICD-10-CM | POA: Diagnosis not present

## 2016-04-15 LAB — POCT INR: INR: 1.7

## 2016-04-20 ENCOUNTER — Other Ambulatory Visit: Payer: Self-pay | Admitting: *Deleted

## 2016-04-20 DIAGNOSIS — I739 Peripheral vascular disease, unspecified: Secondary | ICD-10-CM

## 2016-04-22 ENCOUNTER — Ambulatory Visit (HOSPITAL_COMMUNITY)
Admission: RE | Admit: 2016-04-22 | Discharge: 2016-04-22 | Disposition: A | Discharge status: Home / Self Care | Payer: Medicare Other | Source: Ambulatory Visit | Attending: Cardiology | Admitting: Cardiology

## 2016-04-22 ENCOUNTER — Ambulatory Visit (INDEPENDENT_AMBULATORY_CARE_PROVIDER_SITE_OTHER): Payer: Medicare Other | Admitting: Pharmacist

## 2016-04-22 DIAGNOSIS — Z5181 Encounter for therapeutic drug level monitoring: Secondary | ICD-10-CM

## 2016-04-22 DIAGNOSIS — I480 Paroxysmal atrial fibrillation: Secondary | ICD-10-CM

## 2016-04-22 DIAGNOSIS — I4891 Unspecified atrial fibrillation: Secondary | ICD-10-CM

## 2016-04-22 DIAGNOSIS — I5022 Chronic systolic (congestive) heart failure: Secondary | ICD-10-CM | POA: Diagnosis not present

## 2016-04-22 LAB — BASIC METABOLIC PANEL
Anion gap: 9 (ref 5–15)
BUN: 28 mg/dL — ABNORMAL HIGH (ref 6–20)
CALCIUM: 8.8 mg/dL — AB (ref 8.9–10.3)
CO2: 26 mmol/L (ref 22–32)
CREATININE: 1.37 mg/dL — AB (ref 0.44–1.00)
Chloride: 100 mmol/L — ABNORMAL LOW (ref 101–111)
GFR calc Af Amer: 50 mL/min — ABNORMAL LOW (ref >60)
GFR, EST NON AFRICAN AMERICAN: 43 mL/min — AB (ref >60)
GLUCOSE: 436 mg/dL — AB (ref 65–99)
Potassium: 4.9 mmol/L (ref 3.5–5.1)
Sodium: 135 mmol/L (ref 135–145)

## 2016-04-22 LAB — POCT INR: INR: 2.3

## 2016-04-25 ENCOUNTER — Encounter (HOSPITAL_COMMUNITY): Payer: Self-pay

## 2016-04-25 NOTE — Progress Notes (Signed)
Received fax from [facility] that patient was [name] from services as of 04/12/16 with goals of care met.  [name]

## 2016-04-27 ENCOUNTER — Telehealth (HOSPITAL_COMMUNITY): Payer: Self-pay | Admitting: *Deleted

## 2016-04-27 NOTE — Telephone Encounter (Signed)
[name] called to report pt's wt is up to 250 lb today, she has gradually been increasing since 5/31 when she was 242 lb.  [name] reports pt's legs are very swollen and weeping, pt is scheduled to see pcp tomorrow.  Pt has been taking Torsemide 80 mg daily as prescribed.  Advised per last OV note pt can take an extra 80 mg along w/extra 20 meq of KCL for wt gain, advised for pt to take that for 2 days if not improving or getting worse can go to ER or call us back, she will advise pt and f/u with her early next week

## 2016-05-10 ENCOUNTER — Other Ambulatory Visit (HOSPITAL_COMMUNITY): Payer: Self-pay | Admitting: Cardiology

## 2016-05-10 ENCOUNTER — Other Ambulatory Visit: Payer: Self-pay | Admitting: Cardiology

## 2016-05-11 ENCOUNTER — Other Ambulatory Visit: Payer: Self-pay

## 2016-05-11 ENCOUNTER — Ambulatory Visit (HOSPITAL_COMMUNITY)
Admission: RE | Admit: 2016-05-11 | Discharge: 2016-05-11 | Disposition: A | Discharge status: Home / Self Care | Payer: Medicare Other | Source: Ambulatory Visit | Attending: Internal Medicine | Admitting: Internal Medicine

## 2016-05-11 ENCOUNTER — Encounter (HOSPITAL_COMMUNITY): Payer: Self-pay

## 2016-05-11 ENCOUNTER — Encounter: Payer: Self-pay | Admitting: Licensed Clinical Social Worker

## 2016-05-11 VITALS — BP 120/70 | HR 81 | Wt 250.8 lb

## 2016-05-11 DIAGNOSIS — R001 Bradycardia, unspecified: Secondary | ICD-10-CM | POA: Insufficient documentation

## 2016-05-11 DIAGNOSIS — I5043 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) and diastolic (congestive) heart failure: Secondary | ICD-10-CM | POA: Diagnosis not present

## 2016-05-11 DIAGNOSIS — I2510 Atherosclerotic heart disease of native coronary artery without angina pectoris: Secondary | ICD-10-CM | POA: Insufficient documentation

## 2016-05-11 DIAGNOSIS — J449 Chronic obstructive pulmonary disease, unspecified: Secondary | ICD-10-CM | POA: Insufficient documentation

## 2016-05-11 DIAGNOSIS — N183 Chronic kidney disease, stage 3 (moderate): Secondary | ICD-10-CM | POA: Insufficient documentation

## 2016-05-11 DIAGNOSIS — Z9981 Dependence on supplemental oxygen: Secondary | ICD-10-CM | POA: Insufficient documentation

## 2016-05-11 DIAGNOSIS — R0609 Other forms of dyspnea: Secondary | ICD-10-CM

## 2016-05-11 DIAGNOSIS — K7460 Unspecified cirrhosis of liver: Secondary | ICD-10-CM | POA: Insufficient documentation

## 2016-05-11 DIAGNOSIS — I480 Paroxysmal atrial fibrillation: Secondary | ICD-10-CM | POA: Insufficient documentation

## 2016-05-11 DIAGNOSIS — E1122 Type 2 diabetes mellitus with diabetic chronic kidney disease: Secondary | ICD-10-CM | POA: Insufficient documentation

## 2016-05-11 DIAGNOSIS — Z794 Long term (current) use of insulin: Secondary | ICD-10-CM | POA: Diagnosis not present

## 2016-05-11 DIAGNOSIS — I130 Hypertensive heart and chronic kidney disease with heart failure and stage 1 through stage 4 chronic kidney disease, or unspecified chronic kidney disease: Secondary | ICD-10-CM | POA: Diagnosis present

## 2016-05-11 DIAGNOSIS — N179 Acute kidney failure, unspecified: Secondary | ICD-10-CM | POA: Insufficient documentation

## 2016-05-11 DIAGNOSIS — I739 Peripheral vascular disease, unspecified: Secondary | ICD-10-CM | POA: Diagnosis not present

## 2016-05-11 DIAGNOSIS — I6529 Occlusion and stenosis of unspecified carotid artery: Secondary | ICD-10-CM | POA: Diagnosis not present

## 2016-05-11 DIAGNOSIS — Z7901 Long term (current) use of anticoagulants: Secondary | ICD-10-CM | POA: Insufficient documentation

## 2016-05-11 DIAGNOSIS — I5022 Chronic systolic (congestive) heart failure: Secondary | ICD-10-CM | POA: Diagnosis present

## 2016-05-11 DIAGNOSIS — Z87891 Personal history of nicotine dependence: Secondary | ICD-10-CM | POA: Insufficient documentation

## 2016-05-11 LAB — MAGNESIUM: Magnesium: 2 mg/dL (ref 1.7–2.4)

## 2016-05-11 LAB — BASIC METABOLIC PANEL
ANION GAP: 9 (ref 5–15)
BUN: 28 mg/dL — ABNORMAL HIGH (ref 6–20)
CALCIUM: 8.7 mg/dL — AB (ref 8.9–10.3)
CO2: 25 mmol/L (ref 22–32)
CREATININE: 1.3 mg/dL — AB (ref 0.44–1.00)
Chloride: 100 mmol/L — ABNORMAL LOW (ref 101–111)
GFR, EST AFRICAN AMERICAN: 53 mL/min — AB (ref >60)
GFR, EST NON AFRICAN AMERICAN: 46 mL/min — AB (ref >60)
GLUCOSE: 458 mg/dL — AB (ref 65–99)
Potassium: 4.9 mmol/L (ref 3.5–5.1)
Sodium: 134 mmol/L — ABNORMAL LOW (ref 135–145)

## 2016-05-11 MED ORDER — BISOPROLOL FUMARATE 5 MG PO TABS: 2.5000 mg | ORAL_TABLET | Freq: Every evening | ORAL | Status: DC

## 2016-05-11 MED ORDER — CYANOCOBALAMIN 1000 MCG PO TABS: 1000.0000 ug | ORAL_TABLET | Freq: Every day | ORAL | Status: DC

## 2016-05-11 MED ORDER — ATORVASTATIN CALCIUM 40 MG PO TABS: 40.0000 mg | ORAL_TABLET | Freq: Every evening | ORAL | Status: DC

## 2016-05-11 MED ORDER — METOLAZONE 2.5 MG PO TABS: 2.5000 mg | ORAL_TABLET | ORAL | Status: DC

## 2016-05-11 MED ORDER — AMLODIPINE BESYLATE 10 MG PO TABS: 10.0000 mg | ORAL_TABLET | Freq: Every evening | ORAL | Status: DC

## 2016-05-11 NOTE — Progress Notes (Signed)
CSW and [name] paramedic met with patient and friend in the clinic. Patient reports she continues to look for housing and unfortunately most of the options have long waiting lists. CSW provided patient with additional list in the [facility] of available apartments through [employer]. Patient and friend grateful. Patient also inquired about a "red flag" on her medicaid. CSW not familiar with "red flag" and patient later stated that it was the inability to get home services covered from her medicaid. Patient has [employer] medicaid which does not cover home services. CSW explained medicaid benefits to patient who verbalizes understanding. Patient possibly eligible for deductible medicaid as she was in the past. CSW explained need to collect unpaid medical bills and will assist with request for evaluation for deductible medicaid program. Patient and friend verbalize understanding. CSW continues to follow and coordinate with paramedicine program. Raquel Sarna, LCSW [phone]

## 2016-05-11 NOTE — Progress Notes (Addendum)
Patient ID: [name], female   DOB: 08/24/1961, 55 y.o.   MRN: [id number]    Advanced Heart Failure Clinic Note   PCP: Dr Roxy Manns HF Cardiology: Dr. Aundra Dubin PV: Dr. Gwenlyn Found  55 yo with history of PAD, carotid stenosis s/p left CEA, relatively mild CAD, chronic systolic CHF, paroxysmal atrial fibrillation and prior substance abuse presents for CHF clinic followup.  She was admitted in 1/17 with acute hypoxemic respiratory failure in the setting of atrial fibrillation/RVR and volume overload.  She was initially intubated.  She converted back to NSR with amiodarone gtt.  She was treated with IV Lasix, steroids, bronchodilators.  She developed AKI and losartan was stopped.    She was admitted in 3/17 with symptomatic bradycardia.  She was supposed to stop diltiazem after this appointment but continued to take it.  She presented later in 3/17, again with symptomatic bradycardia (junctional bradycardia), hypotension, and AKI.  Diltiazem and bisoprolol were stopped.  HR recovered and is actually elevated today, rate around 100 bpm.  ACEI was stopped with AKI.   She presents today for regular follow up. At last visit given 2 days extra torsemide with volume overload.  She is up 8 lbs from last visit. Has cellulitis in R foot that is bothering her a lot. Is following wound clinic in [facility], had appt yesterday. Doesn't feel "bad", but just feels bloated. Denies SOB with changing clothes or bathing.  Gets SOB walking, but thinks it is mostly from the pain in her legs.  Has tried taking her torsemide 40 mg BID for the past few days to see if it helped diuresis, it hasn't. Trying to watch fluid and salt but not always successful. Paramedicine still following.   Labs (1/17): K 5.2, creatinine 1.4, AST 43, ALT normal Labs (2/17): K 4.2, creatinine 1.3, BNP 607, TSH normal Labs (3/17): K 4.2, creatinine 1.45, AST/ALT normal, HCT 28.7 Labs (4/17): K 4.4, creatinine 1.61  PMH: 1. Carotid stenosis: Known  occluded right carotid.  Left CEA in 4/16.  2. CAD: LHC in 12/15 with 80% stenosis in small OM1, nonobstructive disease in other territories.  3. Chronic systolic CHF: Nonischemic cardiomyopathy (?due to ETOH or prior drug abuse).  - Echo (1/17) with EF 45%, mild LV hypertrophy, moderate diastolic dysfunction, inferolateral severe hypokinesis, mildly decreased RV systolic function.  4. Atrial fibrillation: Paroxysmal.   5. Type II diabetes 6. CKD: Stage III. 7. COPD: Quit smoking 1/17. On home oxygen.  8. 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  No longer drinks.  9. Hypothyroidism 10. PAD: Atherectomy SFA in 2014 (Dr Gwenlyn Found).  Peripheral arterial dopplers (2/17) with focal 75-99% proximal right SFA stenosis, occluded mid-distal right SFA, chronic occlusion of all runoff arteries on right. 11. Anemia 12. Prior cocaine abuse.  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   SH: Lives with sister.  Prior cocaine abuse.  Prior ETOH abuse.  Quit smoking in 1/17.  FH: CAD  ROS: All systems reviewed and negative except as per HPI.   Current Outpatient Prescriptions  Medication Sig Dispense Refill  . albuterol (PROVENTIL HFA;VENTOLIN HFA) 108 (90 Base) MCG/ACT inhaler Inhale 2 puffs into the lungs every 6 (six) hours as needed for wheezing or shortness of breath. 1 each 3  . albuterol (PROVENTIL) (2.5 MG/3ML) 0.083% nebulizer solution Take 3 mLs (2.5 mg total) by nebulization every 6 (six) hours as needed. For wheezing. 75 mL 2  . amiodarone (PACERONE) 200 MG tablet Take 100  mg by mouth daily.    [name] amLODipine (NORVASC) 10 MG tablet Take 1 tablet (10 mg total) by mouth daily. 30 tablet 6  . atorvastatin (LIPITOR) 40 MG tablet Take 1 tablet (40 mg total) by mouth daily. 30 tablet 2  . bisoprolol (ZEBETA) 5 MG tablet Take 0.5 tablets (2.5 mg total) by mouth daily. 15 tablet 3  . Blood Glucose Monitoring Suppl (ACCU-CHEK AVIVA PLUS) W/DEVICE KIT 1 Device by Does not apply  route 4 (four) times daily -  before meals and at bedtime. 1 kit 0  . budesonide-formoterol (SYMBICORT) 160-4.5 MCG/ACT inhaler Inhale 2 puffs into the lungs 2 (two) times daily. 1 Inhaler 12  . cholecalciferol (VITAMIN D) 1000 units tablet Take 1,000 Units by mouth daily.    . cyanocobalamin 1000 MCG tablet Take 1 tablet (1,000 mcg total) by mouth daily. 30 tablet 10  . doxycycline (VIBRAMYCIN) 100 MG capsule Take 100 mg by mouth 2 (two) times daily.    . DULoxetine (CYMBALTA) 30 MG capsule Take 30 mg by mouth daily.    . folic acid (FOLVITE) 1 MG tablet Take 1 tablet (1 mg total) by mouth daily. 30 tablet 5  . gabapentin (NEURONTIN) 300 MG capsule Take 300 mg by mouth 4 (four) times daily.    . insulin aspart (NOVOLOG FLEXPEN) 100 UNIT/ML FlexPen Inject 17-32 Units into the skin 3 (three) times daily with meals.    . Insulin Glargine (LANTUS SOLOSTAR) 100 UNIT/ML Solostar Pen Inject 50 Units into the skin daily at 10 pm.    . Insulin Pen Needle (ULTICARE MICRO PEN NEEDLES) 32G X 4 MM MISC 1 Syringe by Does not apply route 4 (four) times daily - after meals and at bedtime. 1 each 11  . Lancets (ACCU-CHEK SOFT TOUCH) lancets Use as instructed 100 each 12  . levothyroxine (SYNTHROID, LEVOTHROID) 50 MCG tablet Take 1 tablet (50 mcg total) by mouth daily. 30 tablet 2  . nitroGLYCERIN (NITROSTAT) 0.4 MG SL tablet Place 1 tablet (0.4 mg total) under the tongue every 5 (five) minutes as needed for chest pain. 25 tablet 3  . OXYGEN 2lpm 24/7 AHC    . potassium chloride SA (K-DUR,KLOR-CON) 20 MEQ tablet Take 1.5 tablets (30 mEq total) by mouth daily. Take an extra 1.5 tabs as directed 60 tablet 3  . torsemide (DEMADEX) 20 MG tablet Take 4 tablets (80 mg total) by mouth daily. Take extra 4 tabs as directed 180 tablet 3  . warfarin (COUMADIN) 5 MG tablet Take 1-1.5 tablets by mouth daily as directed by coumadin clinic 45 tablet 3   No current facility-administered medications for this encounter.       Filed Vitals:   05/11/16 1519  BP: 120/70  Pulse: 81  Weight: 250 lb 12.8 oz (113.762 kg)  SpO2: 94%   Wt Readings from Last 3 Encounters:  05/11/16 250 lb 12.8 oz (113.762 kg)  04/13/16 242 lb (109.77 kg)  03/01/16 231 lb 12 oz (105.121 kg)     General: NAD Neck: Thick, JVP to ear, no thyromegaly or thyroid nodule.  Lungs: Distant breath sounds bilaterally, but seem clear. ? Mild basilar crackles. CV: Nondisplaced PMI.  Heart regular S1/S2, no S3/S4, no murmur.  No carotid bruit.  Unable to palpate pedal pulses.  Trace right radial pulse, 2+ left radial pulse.  Abdomen: Obese, soft, NT, mildly distended , no HSM. No bruits or masses. +BS  Skin: Intact without lesions or rashes.  Neurologic: Alert and oriented x 3.  Psych: Normal affect. Extremities: No clubbing or cyanosis. UNNA boots in place, 1+ edema to knees.  HEENT: Normal.   Assessment/Plan: 1. Chronic systolic CHF: EF 00% on last echo. Has been presumed nonischemic given LHC in 12/15 showing only 80% stenosis in small OM1.  - Volume status remains elevated. Weight up 8 lbs from last visit. NYHA Class II-III, but pt not very active so difficult to discern. She is more limited by claudication than dypsnea, and now mostly limited by cellulitis.  - Continue torsemide 80 mg daily.  - Take metolazone 2.5 mg x 2 with extra 20 meq of K x days, and repeat weekly on thursdays.  BMET today.  - Continue bisoprolol 2.5 mg daily.  - Off lisinopril with recent AKI.   - Encouraged to limit fluid and sodium intake.  2. Atrial fibrillation: Now in NSR on amiodarone.  She is on warfarin (taken off NOAC with fluctuation in creatinine).  - Continue amiodarone 100 daily.  PCP following hypothyroidism (on Levoxyl).  AST/ALT were normal in 3/17.  Will need regular eye exams. 3. PAD: Bilateral calf claudication seems quit limiting.  No rest pain or pedal ulcers.  Peripheral arterial dopplers show severe disease on right.  Not candidate for  cilostazol with CHF.  Not good candidate for intervention per Dr Gwenlyn Found (seen recently).  4. COPD: On home oxygen.  Has quit smoking, needs to stay off cigarettes.  5. H/o cirrhosis: Likely from ETOH, no longer drinks.  6. Suspected OSA:  - Still has   7. CKD stage III: - Creatinine 1.3 most recently.  - BMET today.  8. HTN:  - Stable on current meds and adjusting diuretics, no further changes.  9. Bradycardia: Junctional rhythm when hospitalized in 3/17.  This has resolved.  She is off diltiazem and bisoprolol.   - Stable on low dose bisoprolol 2.5 mg daily.  HR 81 bpm today.  10. Poorly healing leg wounds - Following with wound clinic with [facility] healthcare in [facility]. On ABX for cellulitis.   Take extra torsemide and extra potassium for next 2 days. Followup 2 weeks to recheck fluid status. BMET and Mg today.    Satira Mccallum Tillery PA-C 05/11/2016

## 2016-05-11 NOTE — Patient Instructions (Signed)
LABS TODAY.  CHANGE AMLODIPINE, BISOPROLOL, AND ATORVASTATIN AT NIGHT DAILY  START METOLAZONE 2.5 MG ONE TAB DAILY FOR 2 DAYS, THEN ONCE A WEEK ON Thursday TAKE AN ADDITIONAL 20 MEQ OF POTASSIUM WITH EVERY DOSE OF METOLAZONE  Your physician recommends that you schedule a follow-up appointment in: 2 WEEKS [place] the following things EVERYDAY: 1) Weigh yourself in the morning before breakfast. Write it down and keep it in a log. 2) Take your medicines as prescribed 3) Eat low salt foods-Limit salt (sodium) to 2000 mg per day.  4) Stay as active as you can everyday 5) Limit all fluids for the day to less than 2 liters.

## 2016-05-11 NOTE — Telephone Encounter (Signed)
Please advise 

## 2016-05-16 ENCOUNTER — Ambulatory Visit (INDEPENDENT_AMBULATORY_CARE_PROVIDER_SITE_OTHER): Payer: Medicare Other | Admitting: Pharmacist Clinician (PhC)/ Clinical Pharmacy Specialist

## 2016-05-16 DIAGNOSIS — I480 Paroxysmal atrial fibrillation: Secondary | ICD-10-CM | POA: Diagnosis not present

## 2016-05-16 DIAGNOSIS — I4891 Unspecified atrial fibrillation: Secondary | ICD-10-CM

## 2016-05-16 DIAGNOSIS — Z5181 Encounter for therapeutic drug level monitoring: Secondary | ICD-10-CM

## 2016-05-16 LAB — POCT INR: INR: 1.5

## 2016-05-19 DIAGNOSIS — L97929 Non-pressure chronic ulcer of unspecified part of left lower leg with unspecified severity: Secondary | ICD-10-CM | POA: Insufficient documentation

## 2016-05-19 IMAGING — CR DG CHEST 1V PORT
1 series · 1 of 1 positions shown · non-contrast
Comparison: Frontal and lateral views 06/23/2015, chest CT
07/04/2015

CLINICAL DATA: Chest pain and palpitations.

EXAM:
PORTABLE CHEST 1 VIEW

[AP]
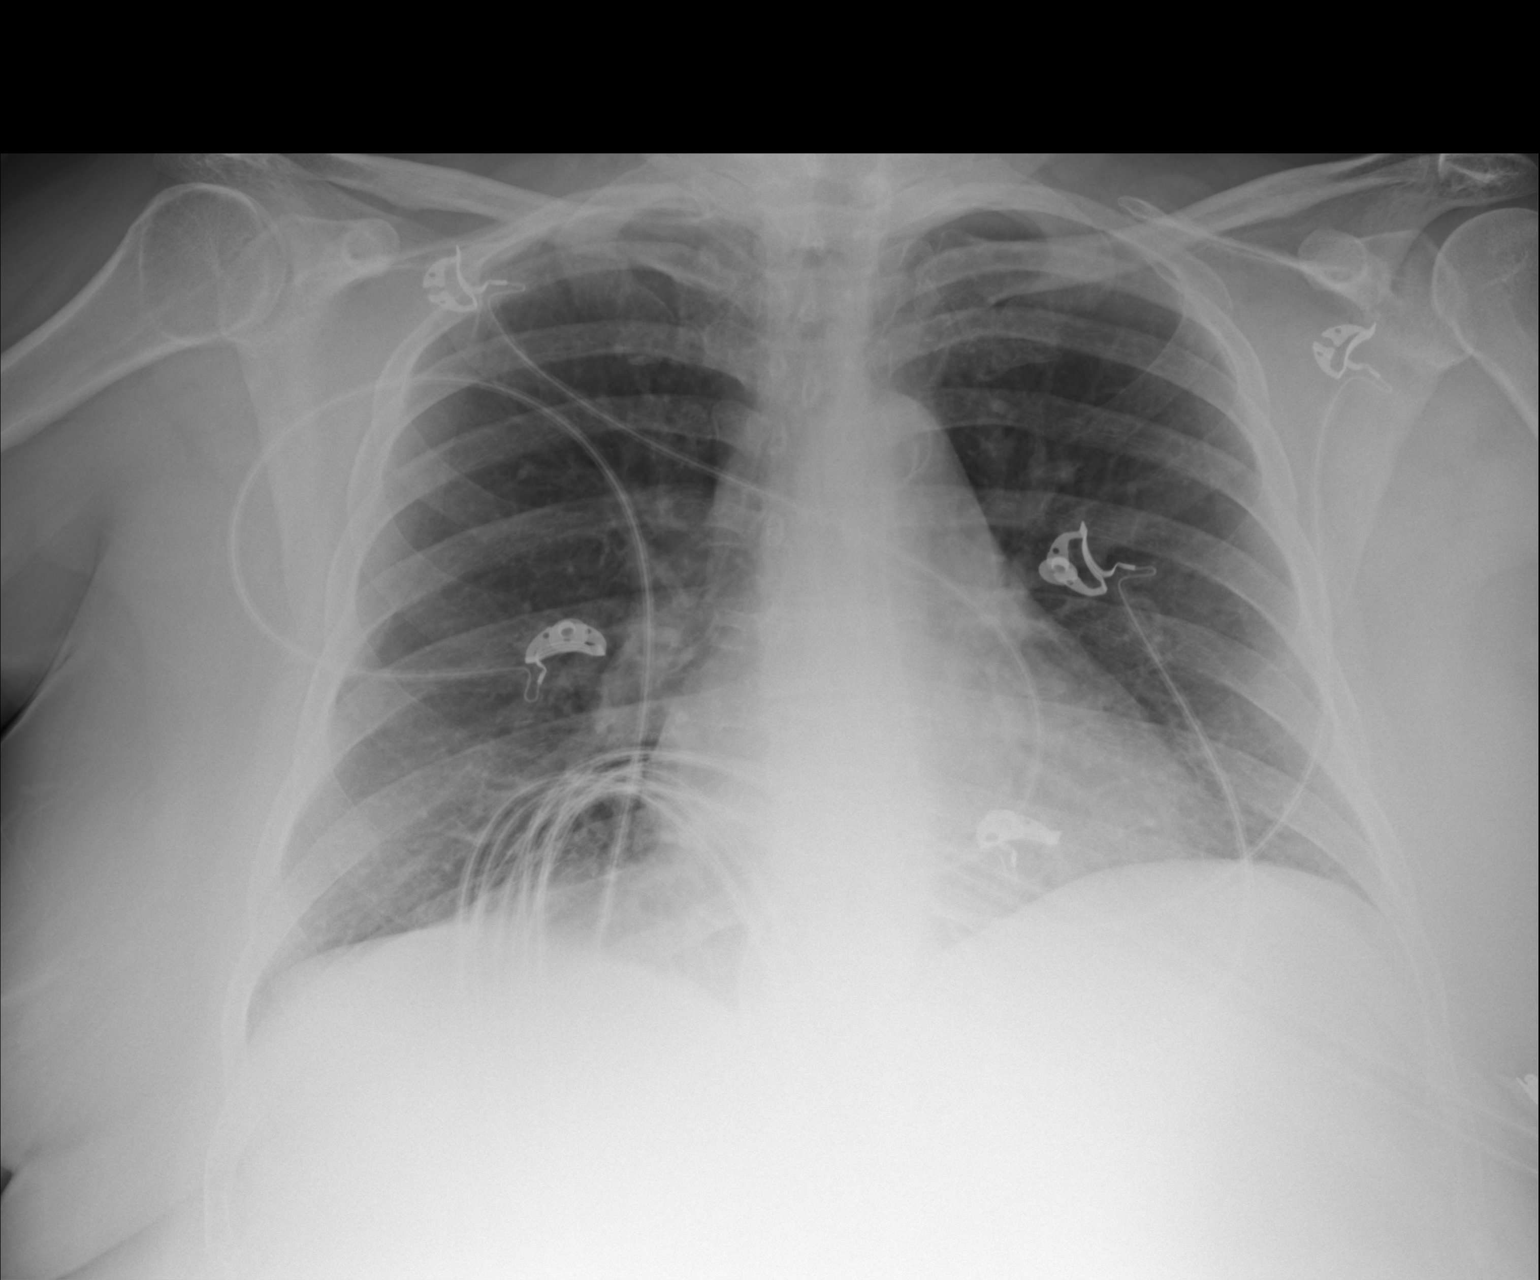

[1 of 1 positions shown; findings below may reference images not displayed]

FINDINGS: The cardiomediastinal contours are unchanged with heart at the upper
limits of normal in size in atherosclerosis of the thoracic aorta.
Pulmonary vasculature is normal. No consolidation, pleural effusion,
or pneumothorax. No acute osseous abnormalities are seen.
IMPRESSION: Borderline cardiomegaly, unchanged.  No acute pulmonary process.

## 2016-05-26 ENCOUNTER — Encounter (HOSPITAL_COMMUNITY): Payer: Medicare Other

## 2016-05-27 ENCOUNTER — Other Ambulatory Visit (HOSPITAL_COMMUNITY): Payer: Self-pay | Admitting: Cardiology

## 2016-05-30 ENCOUNTER — Ambulatory Visit (INDEPENDENT_AMBULATORY_CARE_PROVIDER_SITE_OTHER): Payer: Medicare Other | Admitting: Pharmacist Clinician (PhC)/ Clinical Pharmacy Specialist

## 2016-05-30 DIAGNOSIS — I4891 Unspecified atrial fibrillation: Secondary | ICD-10-CM | POA: Diagnosis not present

## 2016-05-30 DIAGNOSIS — I480 Paroxysmal atrial fibrillation: Secondary | ICD-10-CM

## 2016-05-30 DIAGNOSIS — Z5181 Encounter for therapeutic drug level monitoring: Secondary | ICD-10-CM | POA: Diagnosis not present

## 2016-05-30 LAB — POCT INR: INR: 2.7

## 2016-06-09 ENCOUNTER — Ambulatory Visit (HOSPITAL_COMMUNITY)
Admission: RE | Admit: 2016-06-09 | Discharge: 2016-06-09 | Disposition: A | Discharge status: Home / Self Care | Payer: Medicare Other | Source: Ambulatory Visit | Attending: Cardiology | Admitting: Cardiology

## 2016-06-09 ENCOUNTER — Other Ambulatory Visit: Payer: Self-pay | Admitting: Cardiology

## 2016-06-09 ENCOUNTER — Other Ambulatory Visit: Payer: Self-pay | Admitting: Family Medicine

## 2016-06-09 ENCOUNTER — Other Ambulatory Visit: Payer: Self-pay | Admitting: Cardiovascular Disease

## 2016-06-09 VITALS — BP 164/102 | HR 81 | Wt 245.0 lb

## 2016-06-09 DIAGNOSIS — I2510 Atherosclerotic heart disease of native coronary artery without angina pectoris: Secondary | ICD-10-CM | POA: Insufficient documentation

## 2016-06-09 DIAGNOSIS — Z794 Long term (current) use of insulin: Secondary | ICD-10-CM | POA: Insufficient documentation

## 2016-06-09 DIAGNOSIS — I739 Peripheral vascular disease, unspecified: Secondary | ICD-10-CM | POA: Diagnosis not present

## 2016-06-09 DIAGNOSIS — E1122 Type 2 diabetes mellitus with diabetic chronic kidney disease: Secondary | ICD-10-CM | POA: Diagnosis not present

## 2016-06-09 DIAGNOSIS — J449 Chronic obstructive pulmonary disease, unspecified: Secondary | ICD-10-CM | POA: Insufficient documentation

## 2016-06-09 DIAGNOSIS — N183 Chronic kidney disease, stage 3 (moderate): Secondary | ICD-10-CM | POA: Insufficient documentation

## 2016-06-09 DIAGNOSIS — Z87891 Personal history of nicotine dependence: Secondary | ICD-10-CM | POA: Insufficient documentation

## 2016-06-09 DIAGNOSIS — R001 Bradycardia, unspecified: Secondary | ICD-10-CM | POA: Diagnosis not present

## 2016-06-09 DIAGNOSIS — Z9981 Dependence on supplemental oxygen: Secondary | ICD-10-CM | POA: Diagnosis not present

## 2016-06-09 DIAGNOSIS — K7460 Unspecified cirrhosis of liver: Secondary | ICD-10-CM | POA: Insufficient documentation

## 2016-06-09 DIAGNOSIS — E039 Hypothyroidism, unspecified: Secondary | ICD-10-CM | POA: Diagnosis not present

## 2016-06-09 DIAGNOSIS — I5022 Chronic systolic (congestive) heart failure: Secondary | ICD-10-CM | POA: Diagnosis present

## 2016-06-09 DIAGNOSIS — Z79899 Other long term (current) drug therapy: Secondary | ICD-10-CM | POA: Diagnosis not present

## 2016-06-09 DIAGNOSIS — Z8249 Family history of ischemic heart disease and other diseases of the circulatory system: Secondary | ICD-10-CM | POA: Insufficient documentation

## 2016-06-09 DIAGNOSIS — Z7901 Long term (current) use of anticoagulants: Secondary | ICD-10-CM | POA: Diagnosis not present

## 2016-06-09 DIAGNOSIS — I480 Paroxysmal atrial fibrillation: Secondary | ICD-10-CM | POA: Insufficient documentation

## 2016-06-09 DIAGNOSIS — I130 Hypertensive heart and chronic kidney disease with heart failure and stage 1 through stage 4 chronic kidney disease, or unspecified chronic kidney disease: Secondary | ICD-10-CM | POA: Insufficient documentation

## 2016-06-09 LAB — BASIC METABOLIC PANEL
Anion gap: 10 (ref 5–15)
BUN: 31 mg/dL — AB (ref 6–20)
CHLORIDE: 94 mmol/L — AB (ref 101–111)
CO2: 28 mmol/L (ref 22–32)
CREATININE: 1.63 mg/dL — AB (ref 0.44–1.00)
Calcium: 8.6 mg/dL — ABNORMAL LOW (ref 8.9–10.3)
GFR calc Af Amer: 41 mL/min — ABNORMAL LOW (ref >60)
GFR calc non Af Amer: 35 mL/min — ABNORMAL LOW (ref >60)
GLUCOSE: 441 mg/dL — AB (ref 65–99)
Potassium: 4.6 mmol/L (ref 3.5–5.1)
SODIUM: 132 mmol/L — AB (ref 135–145)

## 2016-06-09 MED ORDER — LOSARTAN POTASSIUM 25 MG PO TABS: 12.5000 mg | ORAL_TABLET | Freq: Every day | ORAL | 3 refills | Status: DC

## 2016-06-09 NOTE — Patient Instructions (Signed)
START Losartan 12.5 mg (one half tab) daily  Labs today and again in 7- 10 days  Your physician recommends that you schedule a follow-up appointment in: 4 weeks with the PA  Do the following things EVERYDAY: 1) Weigh yourself in the morning before breakfast. Write it down and keep it in a log. 2) Take your medicines as prescribed 3) Eat low salt foods-Limit salt (sodium) to 2000 mg per day.  4) Stay as active as you can everyday 5) Limit all fluids for the day to less than 2 liters

## 2016-06-09 NOTE — Progress Notes (Signed)
Patient ID: [name], female   DOB: November 17, 1960, 55 y.o.   MRN: [id number]    Advanced Heart Failure Clinic Note   PCP: Dr Roxy Manns HF Cardiology: Dr. Aundra Dubin PV: Dr. Gwenlyn Found  55 yo with history of PAD, carotid stenosis s/p left CEA, relatively mild CAD, chronic systolic CHF, paroxysmal atrial fibrillation and prior substance abuse presents for CHF clinic followup.  She was admitted in 1/17 with acute hypoxemic respiratory failure in the setting of atrial fibrillation/RVR and volume overload.  She was initially intubated.  She converted back to NSR with amiodarone gtt.  She was treated with IV Lasix, steroids, bronchodilators.  She developed AKI and losartan was stopped.    She was admitted in 3/17 with symptomatic bradycardia.  She was supposed to stop diltiazem after this appointment but continued to take it.  She presented later in 3/17, again with symptomatic bradycardia (junctional bradycardia), hypotension, and AKI.  Diltiazem and bisoprolol were stopped.  HR recovered and is actually elevated today, rate around 100 bpm.  ACEI was stopped with AKI.   She presents today for regular follow up. At last visit given 2 days extra torsemide with volume overload. She is down 5 lbs from that visit. Feels like fluid is under better control. She is limited by claudication before she can get DOE. Denies SOB with ADLs. Taking torsemide 80 mg daily.  Haven't had to take any extra. Pees a little better on metolazone days.  Trying to watch salt and fluid, but difficult with the heat. Occasionally smokes a bit of one of her sisters cigarettes. Paramedicine still following.   Labs (1/17): K 5.2, creatinine 1.4, AST 43, ALT normal Labs (2/17): K 4.2, creatinine 1.3, BNP 607, TSH normal Labs (3/17): K 4.2, creatinine 1.45, AST/ALT normal, HCT 28.7 Labs (4/17): K 4.4, creatinine 1.61  PMH: 1. Carotid stenosis: Known occluded right carotid.  Left CEA in 4/16.  2. CAD: LHC in 12/15 with 80% stenosis in small  OM1, nonobstructive disease in other territories.  3. Chronic systolic CHF: Nonischemic cardiomyopathy (?due to ETOH or prior drug abuse).  - Echo (1/17) with EF 45%, mild LV hypertrophy, moderate diastolic dysfunction, inferolateral severe hypokinesis, mildly decreased RV systolic function.  4. Atrial fibrillation: Paroxysmal.   5. Type II diabetes 6. CKD: Stage III. 7. COPD: Quit smoking 1/17. On home oxygen.  8. 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  No longer drinks.  9. Hypothyroidism 10. PAD: Atherectomy SFA in 2014 (Dr Gwenlyn Found).  Peripheral arterial dopplers (2/17) with focal 75-99% proximal right SFA stenosis, occluded mid-distal right SFA, chronic occlusion of all runoff arteries on right. 11. Anemia 12. Prior cocaine abuse.  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   SH: Lives with sister.  Prior cocaine abuse.  Prior ETOH abuse.  Quit smoking in 1/17.  FH: CAD  ROS: All systems reviewed and negative except as per HPI.   Current Outpatient Prescriptions  Medication Sig Dispense Refill  . albuterol (PROVENTIL HFA;VENTOLIN HFA) 108 (90 Base) MCG/ACT inhaler Inhale 2 puffs into the lungs every 6 (six) hours as needed for wheezing or shortness of breath. 1 each 3  . albuterol (PROVENTIL) (2.5 MG/3ML) 0.083% nebulizer solution Take 3 mLs (2.5 mg total) by nebulization every 6 (six) hours as needed. For wheezing. 75 mL 2  . amiodarone (PACERONE) 200 MG tablet Take 100 mg by mouth daily.    [name] amLODipine (NORVASC) 10 MG tablet Take 1 tablet (10 mg total) by mouth  every evening. 30 tablet 6  . atorvastatin (LIPITOR) 40 MG tablet Take 1 tablet (40 mg total) by mouth every evening. 30 tablet 2  . bisoprolol (ZEBETA) 5 MG tablet Take 0.5 tablets (2.5 mg total) by mouth every evening. 15 tablet 3  . Blood Glucose Monitoring Suppl (ACCU-CHEK AVIVA PLUS) W/DEVICE KIT 1 Device by Does not apply route 4 (four) times daily -  before meals and at bedtime. 1 kit 0  .  budesonide-formoterol (SYMBICORT) 160-4.5 MCG/ACT inhaler Inhale 2 puffs into the lungs 2 (two) times daily. 1 Inhaler 12  . cholecalciferol (VITAMIN D) 1000 units tablet Take 1,000 Units by mouth daily.    . cyanocobalamin 1000 MCG tablet Take 1 tablet (1,000 mcg total) by mouth daily. 30 tablet 10  . DULoxetine (CYMBALTA) 30 MG capsule Take 30 mg by mouth daily.    . folic acid (FOLVITE) 1 MG tablet Take 1 tablet (1 mg total) by mouth daily. 30 tablet 5  . gabapentin (NEURONTIN) 300 MG capsule Take 300 mg by mouth 4 (four) times daily.    . insulin aspart (NOVOLOG FLEXPEN) 100 UNIT/ML FlexPen Inject 17-32 Units into the skin 3 (three) times daily with meals.    . Insulin Glargine (LANTUS SOLOSTAR) 100 UNIT/ML Solostar Pen Inject 50 Units into the skin daily at 10 pm.    . Insulin Pen Needle (ULTICARE MICRO PEN NEEDLES) 32G X 4 MM MISC 1 Syringe by Does not apply route 4 (four) times daily - after meals and at bedtime. 1 each 11  . Lancets (ACCU-CHEK SOFT TOUCH) lancets Use as instructed 100 each 12  . levothyroxine (SYNTHROID, LEVOTHROID) 50 MCG tablet Take 1 tablet (50 mcg total) by mouth daily. 30 tablet 2  . metolazone (ZAROXOLYN) 2.5 MG tablet Take 1 tablet (2.5 mg total) by mouth as directed. WEEKLY ON THURSDAY 10 tablet 3  . nitroGLYCERIN (NITROSTAT) 0.4 MG SL tablet Place 1 tablet (0.4 mg total) under the tongue every 5 (five) minutes as needed for chest pain. 25 tablet 3  . OXYGEN 2lpm 24/7 AHC    . potassium chloride SA (K-DUR,KLOR-CON) 20 MEQ tablet Take 1.5 tablets (30 mEq total) by mouth daily. Take an extra 1.5 tabs as directed 60 tablet 3  . torsemide (DEMADEX) 20 MG tablet Take 4 tablets (80 mg total) by mouth daily. Take extra 4 tabs as directed 180 tablet 3  . warfarin (COUMADIN) 5 MG tablet Take 1-1.5 tablets by mouth daily as directed by coumadin clinic 45 tablet 3   No current facility-administered medications for this encounter.      Vitals:   06/09/16 1439  BP: (!)  164/102  Pulse: 81  SpO2: 95%  Weight: 245 lb (111.1 kg)   Wt Readings from Last 3 Encounters:  06/09/16 245 lb (111.1 kg)  05/11/16 250 lb 12.8 oz (113.8 kg)  04/13/16 242 lb (109.8 kg)     General: NAD Neck: Thick, JVP difficult to assess, does not appear elevated. No thyromegaly or nodule noted.   Lungs: Distant breath sounds bilaterally CV: Nondisplaced PMI.  Heart regular S1/S2, no S3/S4, no murmur.  No carotid bruit.  Unable to palpate pedal pulses.  Trace right radial pulse, 2+ left radial pulse.  Abdomen: Obese, soft, NT, ND, no HSM. No bruits or masses. +BS  Skin: Intact without lesions or rashes.  Neurologic: Alert and oriented x 3.   Psych: Normal affect. Extremities: No clubbing or cyanosis. UNNA boots in place, 1+ ankle edema HEENT: Normal.  Assessment/Plan: 1. Chronic systolic CHF: EF 87% on last echo. Has been presumed nonischemic given LHC in 12/15 showing only 80% stenosis in small OM1.  - Volume status remains elevated. Weight up 8 lbs from last visit. NYHA Class II-III, but pt not very active so difficult to discern. She is more limited by claudication than dypsnea, and now mostly limited by cellulitis.  - Continue torsemide 80 mg daily.  - Take metolazone 2.5 mg x 2 with extra 20 meq of K x days, and repeat weekly on thursdays.  BMET today.  - Continue bisoprolol 2.5 mg daily.  - Start losartan 12.5 mg daily.   Repeat BMET 7-10 days.  - Encouraged to limit fluid and sodium intake.  2. Atrial fibrillation: Now in NSR on amiodarone.  She is on warfarin (taken off NOAC with fluctuation in creatinine).  - Continue amiodarone 100 daily.  PCP following hypothyroidism (on Levoxyl).  AST/ALT were normal in 3/17.  Will need regular eye exams. 3. PAD: Bilateral calf claudication seems quit limiting.  No rest pain or pedal ulcers.  Peripheral arterial dopplers show severe disease on right.  Not candidate for cilostazol with CHF.  Not good candidate for intervention per Dr  Gwenlyn Found (seen recently).  4. COPD: On home oxygen.  Has quit smoking, needs to stay off cigarettes.  5. H/o cirrhosis: Likely from ETOH, no longer drinks.  6. Suspected OSA:  - Still has   7. CKD stage III: - Creatinine 1.3 most recently.  - BMET today.  8. HTN:  - Stable on current meds and adjusting diuretics, no further changes.  9. Bradycardia: Junctional rhythm when hospitalized in 3/17.   - Stable on low dose bisoprolol 2.5 mg daily.  HR 81 bpm again today.  10. Poorly healing leg wounds - Following with wound clinic with [facility] healthcare in [facility]. On ABX for cellulitis.  - Healing is going well so far.     Volume status improved with extra diuretics at last visit. Looks good today. Needs to cut back on salt and fluid.   BMET today and repeat in 7-10 with adding losartan. Follow up 4 weeks.   Satira Mccallum Tekila Caillouet PA-C 06/09/2016  Total time spent > 25 minutes. Over half that spent discussing the above.

## 2016-06-15 ENCOUNTER — Telehealth: Payer: Self-pay | Admitting: Licensed Clinical Social Worker

## 2016-06-15 NOTE — Telephone Encounter (Signed)
CSW received referral to assist with food/meals on wheels. CSW attempted to reach patient via phone with no success. Will attempt again. Raquel Sarna, LCSW [phone]

## 2016-06-17 ENCOUNTER — Telehealth (HOSPITAL_COMMUNITY): Payer: Self-pay

## 2016-06-17 NOTE — Telephone Encounter (Signed)
[name] with CHF paramedicine program left VM after hours last night on CHF clinic triage line to report patient had 7 lb weight gain since last visit with [place] about a week ago. Did not leave any further details (vital signs, s/s, how patient felt, etc.) Attempted to call patient directly on all available lines, no answer, left VM to return our call to let us know how she is doing, compliance with diet and fluid, and what medications she is taking and how to further assess situation.  [name] RN

## 2016-06-20 ENCOUNTER — Ambulatory Visit (HOSPITAL_COMMUNITY)
Admission: RE | Admit: 2016-06-20 | Discharge: 2016-06-20 | Disposition: A | Discharge status: Home / Self Care | Payer: Medicare Other | Source: Ambulatory Visit | Attending: Internal Medicine | Admitting: Internal Medicine

## 2016-06-20 DIAGNOSIS — I5023 Acute on chronic systolic (congestive) heart failure: Secondary | ICD-10-CM

## 2016-06-20 DIAGNOSIS — I5022 Chronic systolic (congestive) heart failure: Secondary | ICD-10-CM | POA: Insufficient documentation

## 2016-06-20 LAB — BASIC METABOLIC PANEL
ANION GAP: 10 (ref 5–15)
BUN: 29 mg/dL — ABNORMAL HIGH (ref 6–20)
CALCIUM: 8.4 mg/dL — AB (ref 8.9–10.3)
CO2: 26 mmol/L (ref 22–32)
Chloride: 100 mmol/L — ABNORMAL LOW (ref 101–111)
Creatinine, Ser: 1.36 mg/dL — ABNORMAL HIGH (ref 0.44–1.00)
GFR, EST AFRICAN AMERICAN: 50 mL/min — AB (ref >60)
GFR, EST NON AFRICAN AMERICAN: 44 mL/min — AB (ref >60)
Glucose, Bld: 331 mg/dL — ABNORMAL HIGH (ref 65–99)
POTASSIUM: 4.4 mmol/L (ref 3.5–5.1)
SODIUM: 136 mmol/L (ref 135–145)

## 2016-06-22 ENCOUNTER — Telehealth (HOSPITAL_COMMUNITY): Payer: Self-pay | Admitting: Cardiology

## 2016-06-22 ENCOUNTER — Encounter (HOSPITAL_COMMUNITY): Payer: Self-pay | Admitting: *Deleted

## 2016-06-22 DIAGNOSIS — Z955 Presence of coronary angioplasty implant and graft: Secondary | ICD-10-CM | POA: Diagnosis not present

## 2016-06-22 DIAGNOSIS — I5042 Chronic combined systolic (congestive) and diastolic (congestive) heart failure: Secondary | ICD-10-CM | POA: Insufficient documentation

## 2016-06-22 DIAGNOSIS — Z87891 Personal history of nicotine dependence: Secondary | ICD-10-CM | POA: Insufficient documentation

## 2016-06-22 DIAGNOSIS — I2510 Atherosclerotic heart disease of native coronary artery without angina pectoris: Secondary | ICD-10-CM | POA: Diagnosis not present

## 2016-06-22 DIAGNOSIS — I252 Old myocardial infarction: Secondary | ICD-10-CM | POA: Diagnosis not present

## 2016-06-22 DIAGNOSIS — Z7901 Long term (current) use of anticoagulants: Secondary | ICD-10-CM | POA: Insufficient documentation

## 2016-06-22 DIAGNOSIS — J449 Chronic obstructive pulmonary disease, unspecified: Secondary | ICD-10-CM | POA: Insufficient documentation

## 2016-06-22 DIAGNOSIS — E039 Hypothyroidism, unspecified: Secondary | ICD-10-CM | POA: Insufficient documentation

## 2016-06-22 DIAGNOSIS — Z794 Long term (current) use of insulin: Secondary | ICD-10-CM | POA: Diagnosis not present

## 2016-06-22 DIAGNOSIS — I110 Hypertensive heart disease with heart failure: Secondary | ICD-10-CM | POA: Diagnosis not present

## 2016-06-22 DIAGNOSIS — E1140 Type 2 diabetes mellitus with diabetic neuropathy, unspecified: Secondary | ICD-10-CM | POA: Diagnosis not present

## 2016-06-22 DIAGNOSIS — M25552 Pain in left hip: Secondary | ICD-10-CM | POA: Diagnosis not present

## 2016-06-22 DIAGNOSIS — R51 Headache: Secondary | ICD-10-CM | POA: Diagnosis present

## 2016-06-22 DIAGNOSIS — I509 Heart failure, unspecified: Secondary | ICD-10-CM

## 2016-06-22 NOTE — ED Triage Notes (Signed)
pt states she has history with sciatica nerve pains. And her feet feel like pins and needles chronically.

## 2016-06-22 NOTE — ED Triage Notes (Signed)
pt c/o new onset lower back pain with radiation to both legs. States it started 2 days ago, denies injury.

## 2016-06-22 NOTE — ED Notes (Signed)
Called patient name to recheck vital but no answer

## 2016-06-22 NOTE — Telephone Encounter (Signed)
[name] with para medicine called to report 10 lb weight gain in a week (weight today 258) Diet is poor (does not adhere to fluid/sodium restriction)  Patient has been taking  meds as prescribed Denies SOB- +LE edema and L flank pain  Metolazone 2.5 weekly Thursday Torsemide 80 daily  K 30 daily, with extra 30 prn  Labs 06/20/16 k- 4.4 Cr- 1.36  Per VO Lily Kocher  take metolazone today, increase torsemide to 80 mg BID x 2 days, increase K to 30 meq BID x 2 days [facility]   [name] aware and will report to patient

## 2016-06-23 ENCOUNTER — Emergency Department (HOSPITAL_COMMUNITY): Payer: Medicare Other

## 2016-06-23 ENCOUNTER — Emergency Department (HOSPITAL_COMMUNITY)
Admission: EM | Admit: 2016-06-23 | Discharge: 2016-06-23 | Disposition: A | Discharge status: Home / Self Care | Payer: Medicare Other | Attending: Emergency Medicine | Admitting: Emergency Medicine

## 2016-06-23 DIAGNOSIS — R519 Headache, unspecified: Secondary | ICD-10-CM

## 2016-06-23 DIAGNOSIS — R51 Headache: Secondary | ICD-10-CM

## 2016-06-23 DIAGNOSIS — M25552 Pain in left hip: Secondary | ICD-10-CM

## 2016-06-23 LAB — CBG MONITORING, ED: Glucose-Capillary: 101 mg/dL — ABNORMAL HIGH (ref 65–99)

## 2016-06-23 MED ORDER — TRAMADOL HCL 50 MG PO TABS
50.0000 mg | ORAL_TABLET | Freq: Once | ORAL | Status: DC
Start: 2016-06-23 — End: 2016-06-23
  Filled 2016-06-23: qty 1

## 2016-06-23 MED ORDER — FENTANYL CITRATE (PF) 100 MCG/2ML IJ SOLN: 50.0000 ug | Freq: Once | INTRAMUSCULAR | Status: DC

## 2016-06-23 NOTE — ED Notes (Signed)
Pt understood dc material. NAD Noted 

## 2016-06-23 NOTE — ED Provider Notes (Signed)
[facility] DEPT Provider Note   CSN: [id number] Arrival date & time: 06/22/16  2111  First Provider Contact:  First MD Initiated Contact with Patient 06/23/16 0200        History   Chief Complaint Chief Complaint  Patient presents with  . Leg Pain  . Back Pain    HPI [name] is a 55 y.o. female.  [name] is a 55 y.o. female with history of alcohol abuse, anemia, anxiety, CHF, diabetic polyneuropathy, GERD, HTN, HLD, hypothyroidism, polysubstance abuse, T2DM, PAD, obesity, and a.fib on chronic coumadin presents to ED with complaint of left hip pain. Patient states pain started two days ago in her left gluteal muscle and right hip. She denies any trauma or falls. No fever, joint swelling, redness, or warmth. Denies calf pain or swelling. She is able to ambulate with the assistance of a walker. Patient states she has chronic low back pain with right sided sciatica. She has diabetic polyneuropathy with chronic numbness in her feet. She denies any new numbness. Denies weakness. She has tried treating the pain with tylenol 3 with codeine with minimal relief of symptoms. H/o constipation - states she had a normal bowel movement today. No abdominal pain, blood in stool, dysuria, hematuria, vomiting. No chest pain or SOB. No lower leg swelling or pain. Patient does have a wound on right lower extremity being treated at wound care clinic in [facility].   Of note, patient also endorses a headache, left sided throbbing, gradual in onset. She denies any history of headaches. No trauma. No unilateral weakness or numbness. No changes in vision. No facial droop or slurred speech.       Past Medical History:  Diagnosis Date  . Alcohol abuse   . Alcoholic cirrhosis ([place])   . Alcoholic cirrhosis ([place])   . Anemia   . Anxiety   . B12 deficiency   . CAD (coronary artery disease)    a. cath 11/10/2014 LM nl, LAD min irregs, D1 30 ost, D2 50d, LCX 24m OM1 80 p/m (1.5 mm vessel), OM2  471mRCA nondom 9051mmed rx.. Demand ischemia in the setting of rapid a-fib.  . Cardiomyopathy (HCC[place]orse [place] . Carotid artery disease (HCC[place]oystown  a. 01/2015 Carotid Angio: RICA [id number]/p L carotid endarterectomy 02/2015.  . CMarland Kitchenronic combined systolic and diastolic CHF (congestive heart failure) (HCCLangley  a. Last echo 12/205: EF 40-45%, inferoapical/posterior HK, not technically sufficient to allow eval of LV diastolic function, normal LA in size..  . Cocaine abuse   . COPD (chronic obstructive pulmonary disease) (HCCGran[place] . Critical lower limb ischemia   . Diabetic peripheral neuropathy (HCCCok[place] . Elevated troponin    a. Chronic elevation.  . GMarland KitchenRD (gastroesophageal reflux disease)   . Hyperlipemia   . Hypertension   . Hypokalemia   . Hypomagnesemia   . Hypothyroidism   . Marijuana abuse   . Narcotic abuse   . Noncompliance   . NSVT (nonsustained ventricular tachycardia) (HCCAnderson . Obesity   . PAF (paroxysmal atrial fibrillation) (HCCTwig[place] . Paroxysmal atrial tachycardia (HCCSteele . Paroxysmal atrial tachycardia (HCCAvond[place] . Peripheral arterial disease (HCCGrand Terrace  a. 01/2015 Angio/PTA: LSFA 100 w/ recon @ adductor canal and 1 vessel runoff via AT, RSFA 99 (atherectomy/pta) - 1 vessel runoff via diff dzs peroneal.  . Poorly controlled type 2 diabetes mellitus (HCCLakeside . Renal insufficiency  a. 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  [name] Symptomatic bradycardia    avoid AV blocking agent per EP  . Tobacco abuse     Patient Active Problem List   Diagnosis Date Noted  . Severe obesity (BMI >= 40) ([place]) 02/24/2016  . COPD GOLD 0  02/24/2016  . Chronic systolic CHF (congestive heart failure) ([place]) 02/12/2016  . Encounter for therapeutic drug monitoring 02/10/2016  . Hyperkalemia   . Bradycardia 02/03/2016  . Symptomatic bradycardia 01/12/2016  . Essential hypertension 12/22/2015  . Elevated troponin   . Wheeze   . CHF (congestive heart failure) ([place]) 11/24/2015  . Fever, unspecified 11/08/2015  . Leukocytosis  11/08/2015  . 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 Hypomagnesemia 11/08/2015  . Hypokalemia 11/08/2015  . NSTEMI (non-ST elevated myocardial infarction) ([place]) 11/07/2015  . Tobacco abuse 10/23/2015  . Uncontrolled type 2 diabetes mellitus ([place])   . Non compliance w medication regimen   . Coronary artery disease involving native coronary artery of native heart   . DOE (dyspnea on exertion) 04/29/2015  . PAF (paroxysmal atrial fibrillation) ([place]) 01/16/2015  . Carotid arterial disease ([place]) 01/16/2015  . Claudication ([place]) 01/15/2015  . Demand ischemia ([place]) 10/29/2014  . HCAP (healthcare-associated pneumonia) 09/07/2014  . Atrial fibrillation with RVR ([place]) 06/16/2014  . Insomnia 02/03/2014  . Pneumococcal pneumonia ([place]) 02/02/2014  . DM type 2, uncontrolled, with neuropathy ([place]) 02/02/2014  . S/P peripheral artery angioplasty - [place] atherectomy; R SFA 09/11/2013    Class: Acute  . Leg pain, bilateral 08/19/2013  . Hypothyroidism 07/31/2013  . Cellulitis 06/13/2013  . History of cocaine abuse 06/13/2013  . Long term current use of anticoagulant therapy 05/20/2013  . Dyspnea on exertion   . Alcohol abuse   . Narcotic abuse   . Marijuana abuse   . Alcoholic cirrhosis ([place])   . DM (diabetes mellitus), type 2 with peripheral vascular complications [facility])     Past Surgical History:  Procedure Laterality Date  . BALLOON ANGIOPLASTY, ARTERY Right 01/15/2015   SFA/notes 01/15/2015  . CARDIAC CATHETERIZATION    . CARDIAC CATHETERIZATION N/A 01/12/2016   Procedure: Temporary Wire;  Surgeon: Minus Breeding, MD;  Location: [facility] CV LAB;  Service: Cardiovascular;  Laterality: N/A;  . CARDIOVERSION  ~ 02/2013   "twice"   . CAROTID ANGIOGRAM N/A 01/15/2015   Procedure: CAROTID ANGIOGRAM;  Surgeon: [name], MD;  Location: [facility] CATH LAB;  Service: Cardiovascular;  Laterality: N/A;  . DILATION AND CURETTAGE OF UTERUS  1988  . ENDARTERECTOMY Left 02/19/2015   Procedure: LEFT  CAROTID ENDARTERECTOMY ;  Surgeon: Serafina Mitchell, MD;  Location: [facility];  Service: Vascular;  Laterality: Left;  . LEFT HEART CATHETERIZATION WITH CORONARY ANGIOGRAM N/A 10/31/2014   Procedure: LEFT HEART CATHETERIZATION WITH CORONARY ANGIOGRAM;  Surgeon: Burnell Blanks, MD;  Location[facility] CATH LAB;  Service: Cardiovascular;  Laterality: N/A;  . LOWER EXTREMITY ANGIOGRAM N/A 09/10/2013   Procedure: LOWER EXTREMITY ANGIOGRAM;  Surgeon: [name], MD;  Location: [facility] CATH LAB;  Service: Cardiovascular;  Laterality: N/A;  . LOWER EXTREMITY ANGIOGRAM N/A 01/15/2015   Procedure: LOWER EXTREMITY ANGIOGRAM;  Surgeon: [name], MD;  Location: [facility] CATH LAB;  Service: Cardiovascular;  Laterality: N/A;  . PERIPHERAL ATHRECTOMY Right 01/15/2015   SFA/notes 01/15/2015    OB History    Gravida Para Term Preterm AB Living   '1 1 1     1   ' SAB TAB Ectopic Multiple Live Births  Home Medications    Prior to Admission medications   Medication Sig Start Date End Date Taking? Authorizing Provider  albuterol (PROVENTIL HFA;VENTOLIN HFA) 108 (90 Base) MCG/ACT inhaler Inhale 2 puffs into the lungs every 6 (six) hours as needed for wheezing or shortness of breath. 03/01/16  Yes Arnoldo Morale, MD  albuterol (PROVENTIL) (2.5 MG/3ML) 0.083% nebulizer solution Take 3 mLs (2.5 mg total) by nebulization every 6 (six) hours as needed. For wheezing. 06/30/15  Yes Arnoldo Morale, MD  amiodarone (PACERONE) 200 MG tablet Take 100 mg by mouth daily.   Yes Historical Provider, MD  amLODipine (NORVASC) 10 MG tablet Take 1 tablet (10 mg total) by mouth every evening. 05/11/16  Yes Shirley Friar, PA-C  atorvastatin (LIPITOR) 40 MG tablet Take 1 tablet (40 mg total) by mouth every evening. 05/11/16  Yes Shirley Friar, PA-C  bisoprolol (ZEBETA) 5 MG tablet Take 0.5 tablets (2.5 mg total) by mouth every evening. 05/11/16  Yes Shirley Friar, PA-C  budesonide-formoterol [facility])  160-4.5 MCG/ACT inhaler Inhale 2 puffs into the lungs 2 (two) times daily. 12/03/15  Yes Shirley Friar, PA-C  cyanocobalamin 1000 MCG tablet Take 1 tablet (1,000 mcg total) by mouth daily. 05/11/16  Yes Brittainy Erie Noe, PA-C  DULoxetine (CYMBALTA) 30 MG capsule Take 30 mg by mouth daily.   Yes Historical Provider, MD  folic acid (FOLVITE) 1 MG tablet TAKE ONE TABLET BY MOUTH DAILY. 06/10/16  Yes Brittainy Erie Noe, PA-C  gabapentin (NEURONTIN) 300 MG capsule Take 300 mg by mouth 4 (four) times daily.   Yes Historical Provider, MD  insulin aspart (NOVOLOG FLEXPEN) 100 UNIT/ML FlexPen Inject 24-44 Units into the skin 3 (three) times daily with meals.    Yes Historical Provider, MD  Insulin Glargine (LANTUS SOLOSTAR) 100 UNIT/ML Solostar Pen Inject 64 Units into the skin daily at 10 pm.    Yes Historical Provider, MD  levothyroxine (SYNTHROID, LEVOTHROID) 50 MCG tablet Take 1 tablet (50 mcg total) by mouth daily. 06/30/15  Yes Arnoldo Morale, MD  losartan (COZAAR) 25 MG tablet Take 0.5 tablets (12.5 mg total) by mouth daily. 06/09/16 09/07/16 Yes Shirley Friar, PA-C  metolazone (ZAROXOLYN) 2.5 MG tablet Take 1 tablet (2.5 mg total) by mouth as directed. WEEKLY ON THURSDAY 05/11/16  Yes Shirley Friar, PA-C  nitroGLYCERIN (NITROSTAT) 0.4 MG SL tablet Place 1 tablet (0.4 mg total) under the tongue every 5 (five) minutes as needed for chest pain. 01/16/15  Yes Rogelia Mire, NP  potassium chloride SA (K-DUR,KLOR-CON) 20 MEQ tablet Take 1.5 tablets (30 mEq total) by mouth daily. Take an extra 1.5 tabs as directed Patient taking differently: Take 30-60 mEq by mouth See admin instructions. Take 1 and 1/2 tablets every day then Take an extra 1 and 1/2 tablets on Thursday 04/05/16  Yes [name] D Clegg, NP  torsemide (DEMADEX) 20 MG tablet Take 4 tablets (80 mg total) by mouth daily. Take extra 4 tabs as directed Patient taking differently: Take 80 mg by mouth daily.  04/05/16  Yes [name] D  Clegg, NP  warfarin (COUMADIN) 5 MG tablet Take 5-7.5 mg by mouth See admin instructions. Take 1 and 1/2 tablets on Monday, Wednesday and Friday then take 1 tablet all the other days   Yes Historical Provider, MD  Blood Glucose Monitoring Suppl (ACCU-CHEK AVIVA PLUS) W/DEVICE KIT 1 Device by Does not apply route 4 (four) times daily -  before meals and at bedtime. 07/21/15   Arnoldo Morale, MD  Insulin Pen Needle (ULTICARE MICRO PEN NEEDLES) 32G X 4 MM MISC 1 Syringe by Does not apply route 4 (four) times daily - after meals and at bedtime. 06/30/15   Arnoldo Morale, MD  Lancets (ACCU-CHEK SOFT TOUCH) lancets Use as instructed 07/21/15   Arnoldo Morale, MD  OXYGEN 2lpm 24/7 [facility]    Historical Provider, MD  warfarin (COUMADIN) 5 MG tablet TAKE 1 TABLET TO 1 AND 1/2 TABLETS BY MOUTH EVERY DAY AS DIRECTED BY COUMADIN CLINIC Patient not taking: Reported on 06/23/2016 06/10/16   [name], MD    Family History Family History  Problem Relation Age of Onset  . Hypertension Mother   . Diabetes Mother   . Cancer Mother     breast, ovarian, colon  . Clotting disorder Mother   . Heart disease Mother   . Heart attack Mother   . Hypertension Father   . Heart disease Father   . Emphysema Sister     smoked    Social History Social History  Substance Use Topics  . Smoking status: Former Smoker    Packs/day: 0.00    Years: 40.00    Types: E-cigarettes    Quit date: 11/03/2015  . Smokeless tobacco: Former Systems developer  . Alcohol use No     Allergies   Review of patient's allergies indicates no known allergies.   Review of Systems Review of Systems  Gastrointestinal: Positive for constipation (h/o).  Musculoskeletal: Positive for arthralgias ( left hip) and back pain ( chronic with right side sciatica).  Skin: Positive for wound ( left lower extremity, managed in [place] by wound care).  Neurological: Positive for numbness ( chronic in right lower extremity, unchanged) and headaches.  All other systems  reviewed and are negative.    Physical Exam Updated Vital Signs BP 125/59 (BP Location: Right Arm)   Pulse 89   Temp 99.3 F (37.4 C) (Oral)   Resp 20   LMP 11/27/2012   SpO2 99%   Physical Exam  Constitutional: She appears well-developed and well-nourished. She appears ill ( chronically). No distress.  HENT:  Head: Normocephalic and atraumatic.  Mouth/Throat: Oropharynx is clear and moist. No oropharyngeal exudate.  Eyes: Conjunctivae and EOM are normal. Pupils are equal, round, and reactive to light. Right eye exhibits no discharge. Left eye exhibits no discharge. No scleral icterus.  Neck: Normal range of motion. Neck supple. No spinous process tenderness and no muscular tenderness present. No neck rigidity. Normal range of motion present.  Cardiovascular: Normal rate, regular rhythm, normal heart sounds and intact distal pulses.   No murmur heard. Pulmonary/Chest: Effort normal and breath sounds normal. No respiratory distress.  Abdominal: Soft. Bowel sounds are normal. There is no tenderness. There is no rebound and no guarding.  Morbidly obese abdomen.   Musculoskeletal: Normal range of motion.  No C-, T-, or L- spinal tenderness. No step off or obvious deformity. Mild TTP of paravertebral muscles b/l. TTP of left greater trochanter. No warmth, redness, or erythema appreciated. Patient is able to ambulate, favors left side.   Lymphadenopathy:    She has no cervical adenopathy.  Neurological: She is alert. She is not disoriented. Coordination normal. GCS eye subscore is 4. GCS verbal subscore is 5. GCS motor subscore is 6.  Mental Status:  Alert, thought content appropriate, able to give a coherent history. Speech fluent without evidence of aphasia. Able to follow 2 step commands without difficulty.  Cranial Nerves:  II:  Peripheral visual fields grossly normal,  pupils equal, round, reactive to light III,IV, VI: ptosis not present, extra-ocular motions intact bilaterally    V,VII: smile symmetric, facial light touch sensation equal VIII: hearing grossly normal to voice  X: uvula elevates symmetrically  XI: bilateral shoulder shrug symmetric and strong XII: midline tongue extension without fassiculations Motor:  Normal tone. 5/5 in upper and lower extremities bilaterally including strong and equal grip strength and dorsiflexion/plantar flexion Sensory: decrease sensation in right lower extremity, per patient his is chronic. Cerebellar: normal finger-to-nose with bilateral upper extremities Gait: normal gait with slight limp favoring left side.  Skin: Skin is warm and dry. She is not diaphoretic.     Psychiatric: She has a normal mood and affect. Her behavior is normal.     ED Treatments / Results  Labs (all labs ordered are listed, but only abnormal results are displayed) Labs Reviewed  CBG MONITORING, ED - Abnormal; Notable for the following:       Result Value   Glucose-Capillary 101 (*)    All other components within normal limits    EKG  EKG Interpretation None       Radiology Ct Head Wo Contrast  Result Date: 06/23/2016 CLINICAL DATA:  Acute onset of headache. Lethargy. Initial encounter. EXAM: CT HEAD WITHOUT CONTRAST TECHNIQUE: Contiguous axial images were obtained from the base of the skull through the vertex without intravenous contrast. COMPARISON:  None. FINDINGS: There is no evidence of acute infarction, mass lesion, or intra- or extra-axial hemorrhage on CT. Small infarcts are noted at the right convexity, within the right parietal lobe. These are most likely chronic in nature. Chronic ischemic change extends into the right basal ganglia. The posterior fossa, including the cerebellum, brainstem and fourth ventricle, is within normal limits. The third and lateral ventricles are within normal limits. No mass effect or midline shift is seen. There is no evidence of fracture; visualized osseous structures are unremarkable in appearance.  The orbits are within normal limits. The paranasal sinuses and mastoid air cells are well-aerated. No significant soft tissue abnormalities are seen. IMPRESSION: 1. No acute intracranial pathology seen on CT. 2. Small infarcts at the right convexity, within the right parietal lobe, are most likely chronic in nature. 3. Chronic ischemic change extends into the right basal ganglia. Electronically Signed   By: Garald Balding M.D.   On: 06/23/2016 04:26   Dg Hip Unilat With Pelvis 2-3 Views Left  Result Date: 06/23/2016 CLINICAL DATA:  Acute onset of left hip pain.  Initial encounter. EXAM: DG HIP (WITH OR WITHOUT PELVIS) 2-3V LEFT COMPARISON:  None. FINDINGS: There is no evidence of fracture or dislocation. Both femoral heads are seated normally within their respective acetabula. The proximal left femur appears intact. No significant degenerative change is appreciated. The sacroiliac joints are unremarkable in appearance. The visualized bowel gas pattern is grossly unremarkable in appearance. IMPRESSION: No evidence of fracture or dislocation. Electronically Signed   By: Garald Balding M.D.   On: 06/23/2016 03:27    Procedures Procedures (including critical care time)  Medications Ordered in ED Medications - No data to display   Initial Impression / Assessment and Plan / ED Course  I have reviewed the triage vital signs and the nursing notes.  Pertinent labs & imaging results that were available during my care of the patient were reviewed by me and considered in my medical decision making (see chart for details).  Clinical Course  Value Comment By Time  DG Hip Unilat With Pelvis 2-3 Views  Left No obvious evidence of fracture or dislocation Roxanna Mew, PA-C 08/10 0400  CT Head Wo Contrast No obvious evidence of acute infarct, hemorrhage, or mass lesion.  Roxanna Mew, PA-C 08/10 0500    Patient is afebrile and non-toxic appearing in NAD. Vital signs are stable. Physical exam  remarkable for TTP of left greater trochanter. No obvious erythema, warmth or swelling appreciated. Patient is able to ambulate; however favors left lower extremity. Low suspicion for septic joint. Neurologic exam remarkable for mild decrease sensation in right lower extremity compared to left; however, per patient this is chronic and unchanged- h/o diabetic neuropathy. Given new onset of headache will CT head. Will take plain films of right hip and pelvis to r/o fracture or dislocation. Pain medication ordered.   CT negative for acute infarct, hemorrhage, or mass lesion - suspect benign source of headache. X-ray of hip negative for obvious fracture or dislocation. When I went to discuss results with patient, she is sleeping comfortably in bed. Discussed results with patient - hip pain may be ?MSK vs. ?bursitis vs. ?arthritis. Discussed symptomatic management. Encouraged follow up with PCP in next few days for re-evaluation. Return precautions provided. Patient voiced understanding and is agreeable.   Final Clinical Impressions(s) / ED Diagnoses   Final diagnoses:  Hip pain, left  Nonintractable headache, unspecified chronicity pattern, unspecified headache type    New Prescriptions Discharge Medication List as of 06/23/2016  5:16 AM       Roxanna Mew, PA-C 06/23/16 2020    Forde Dandy, MD 06/23/16 2137

## 2016-06-23 NOTE — ED Notes (Signed)
CBG is 101.

## 2016-06-23 NOTE — Discharge Instructions (Signed)
Read the information below.   Your head CT did not show any acute abnormality. The x-ray of your hip and pelvis did not show any obvious sign of fracture or dislocation.  You can take tylenol 650mg  6hrs or motrin 400mg  q6hrs for pain relief.  Apply heat or ice to affected area for pain relief.  Use your walker for ambulation. You may return to the Emergency Department at any time for worsening condition or any new symptoms that concern you. Return to ED if you develop worsening symptoms or you develop fever, a red/hot/swollen joint and are unable to walk, or you develop facial droop, slurred speech, one sided weakness/numbness.

## 2016-06-27 ENCOUNTER — Ambulatory Visit (INDEPENDENT_AMBULATORY_CARE_PROVIDER_SITE_OTHER): Payer: Medicare Other | Admitting: Pharmacist

## 2016-06-27 DIAGNOSIS — I480 Paroxysmal atrial fibrillation: Secondary | ICD-10-CM

## 2016-06-27 DIAGNOSIS — Z5181 Encounter for therapeutic drug level monitoring: Secondary | ICD-10-CM

## 2016-06-27 DIAGNOSIS — I4891 Unspecified atrial fibrillation: Secondary | ICD-10-CM

## 2016-06-27 LAB — POCT INR: INR: 2.8

## 2016-06-29 IMAGING — CR DG CHEST 1V PORT
1 series · 1 of 1 positions shown · non-contrast
Comparison: 09/04/2015

CLINICAL DATA: Chest pain and dyspnea, onset tonight.

EXAM:
PORTABLE CHEST 1 VIEW

[AP]
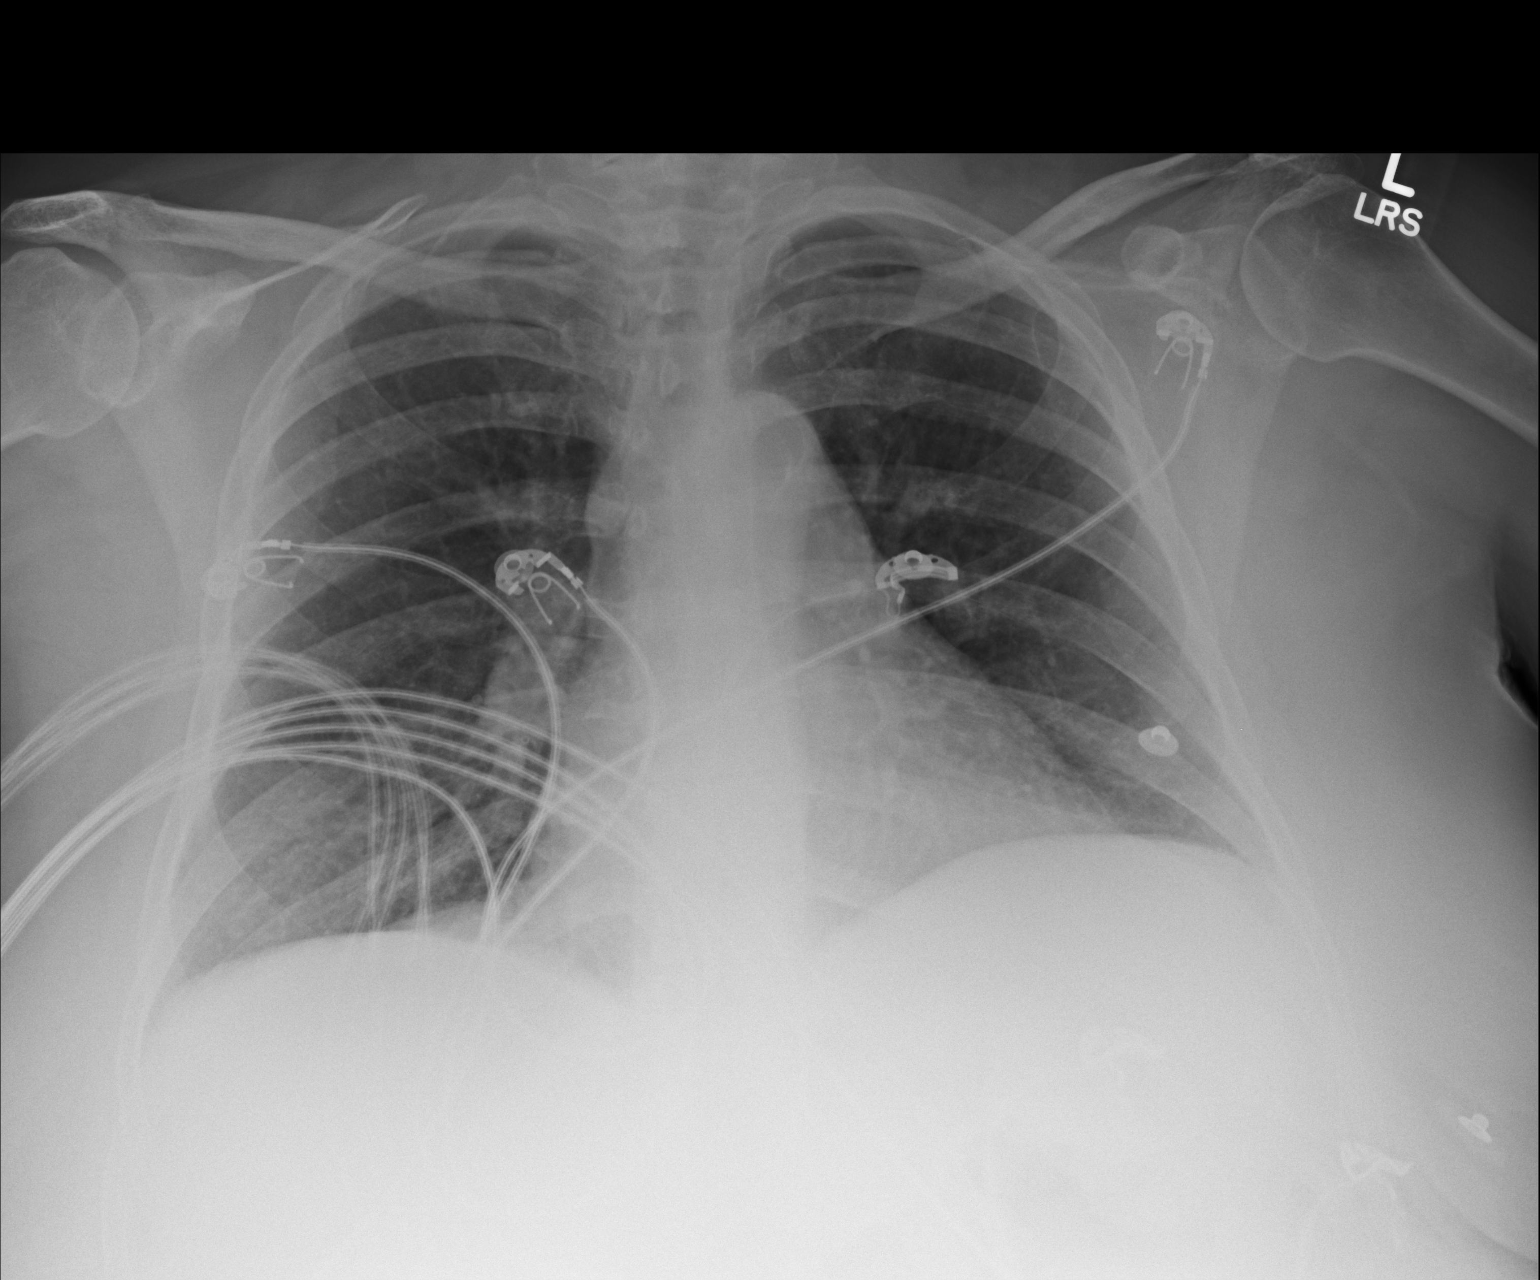

[1 of 1 positions shown; findings below may reference images not displayed]

FINDINGS: A single AP portable view of the chest demonstrates no focal
airspace consolidation or alveolar edema. The lungs are grossly
clear. There is no large effusion or pneumothorax. Cardiac and
mediastinal contours appear unremarkable.
IMPRESSION: No active disease.

## 2016-07-01 ENCOUNTER — Other Ambulatory Visit: Payer: Self-pay | Admitting: Family Medicine

## 2016-07-04 IMAGING — CR DG CHEST 2V
2 series · 2 of 2 positions shown · non-contrast
Comparison: 10/15/2015

CLINICAL DATA: Acute onset chest pain tonight. Atrial fibrillation
with rapid ventricular response. Hypertension.

EXAM:
CHEST  2 VIEW

[chest pa]
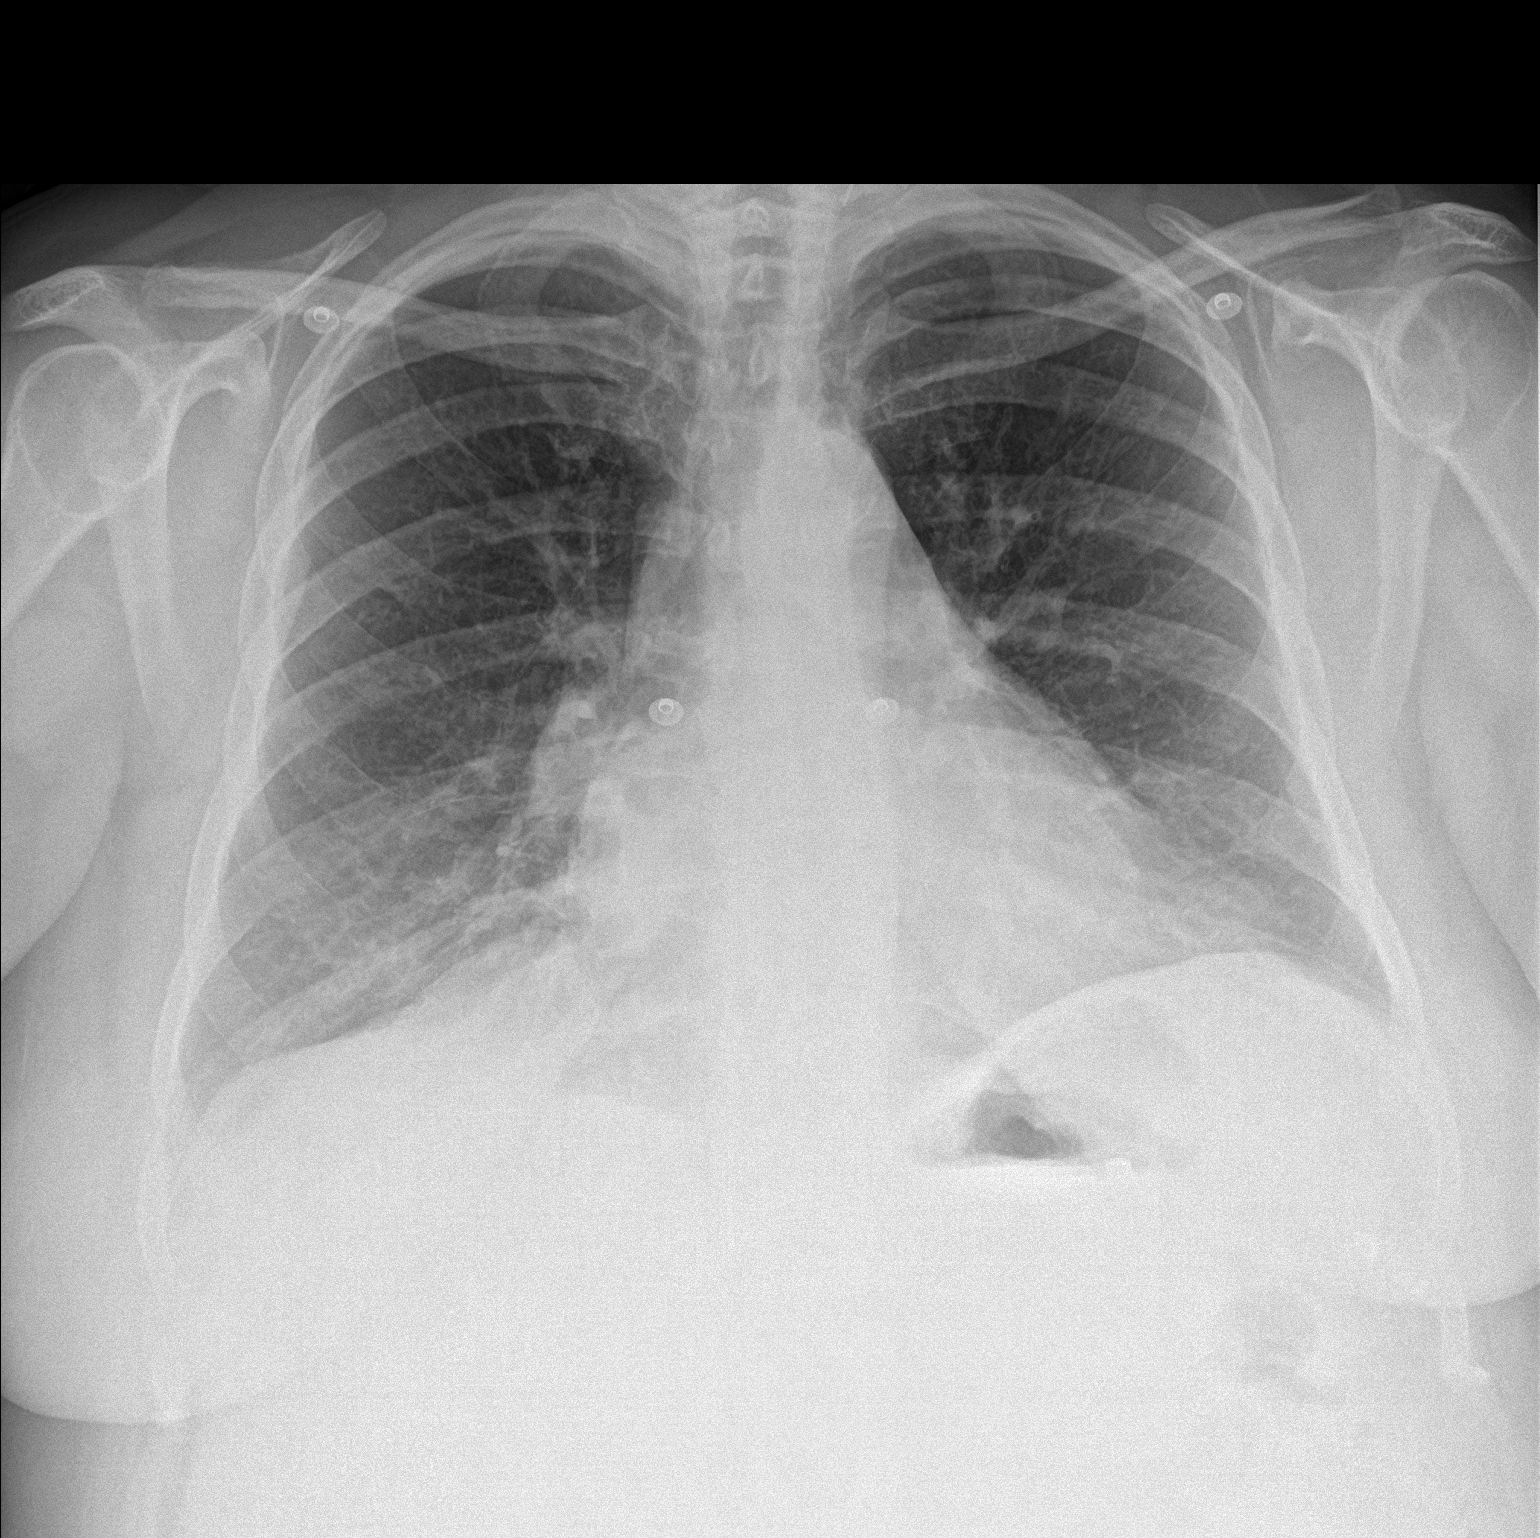

[chest lat]
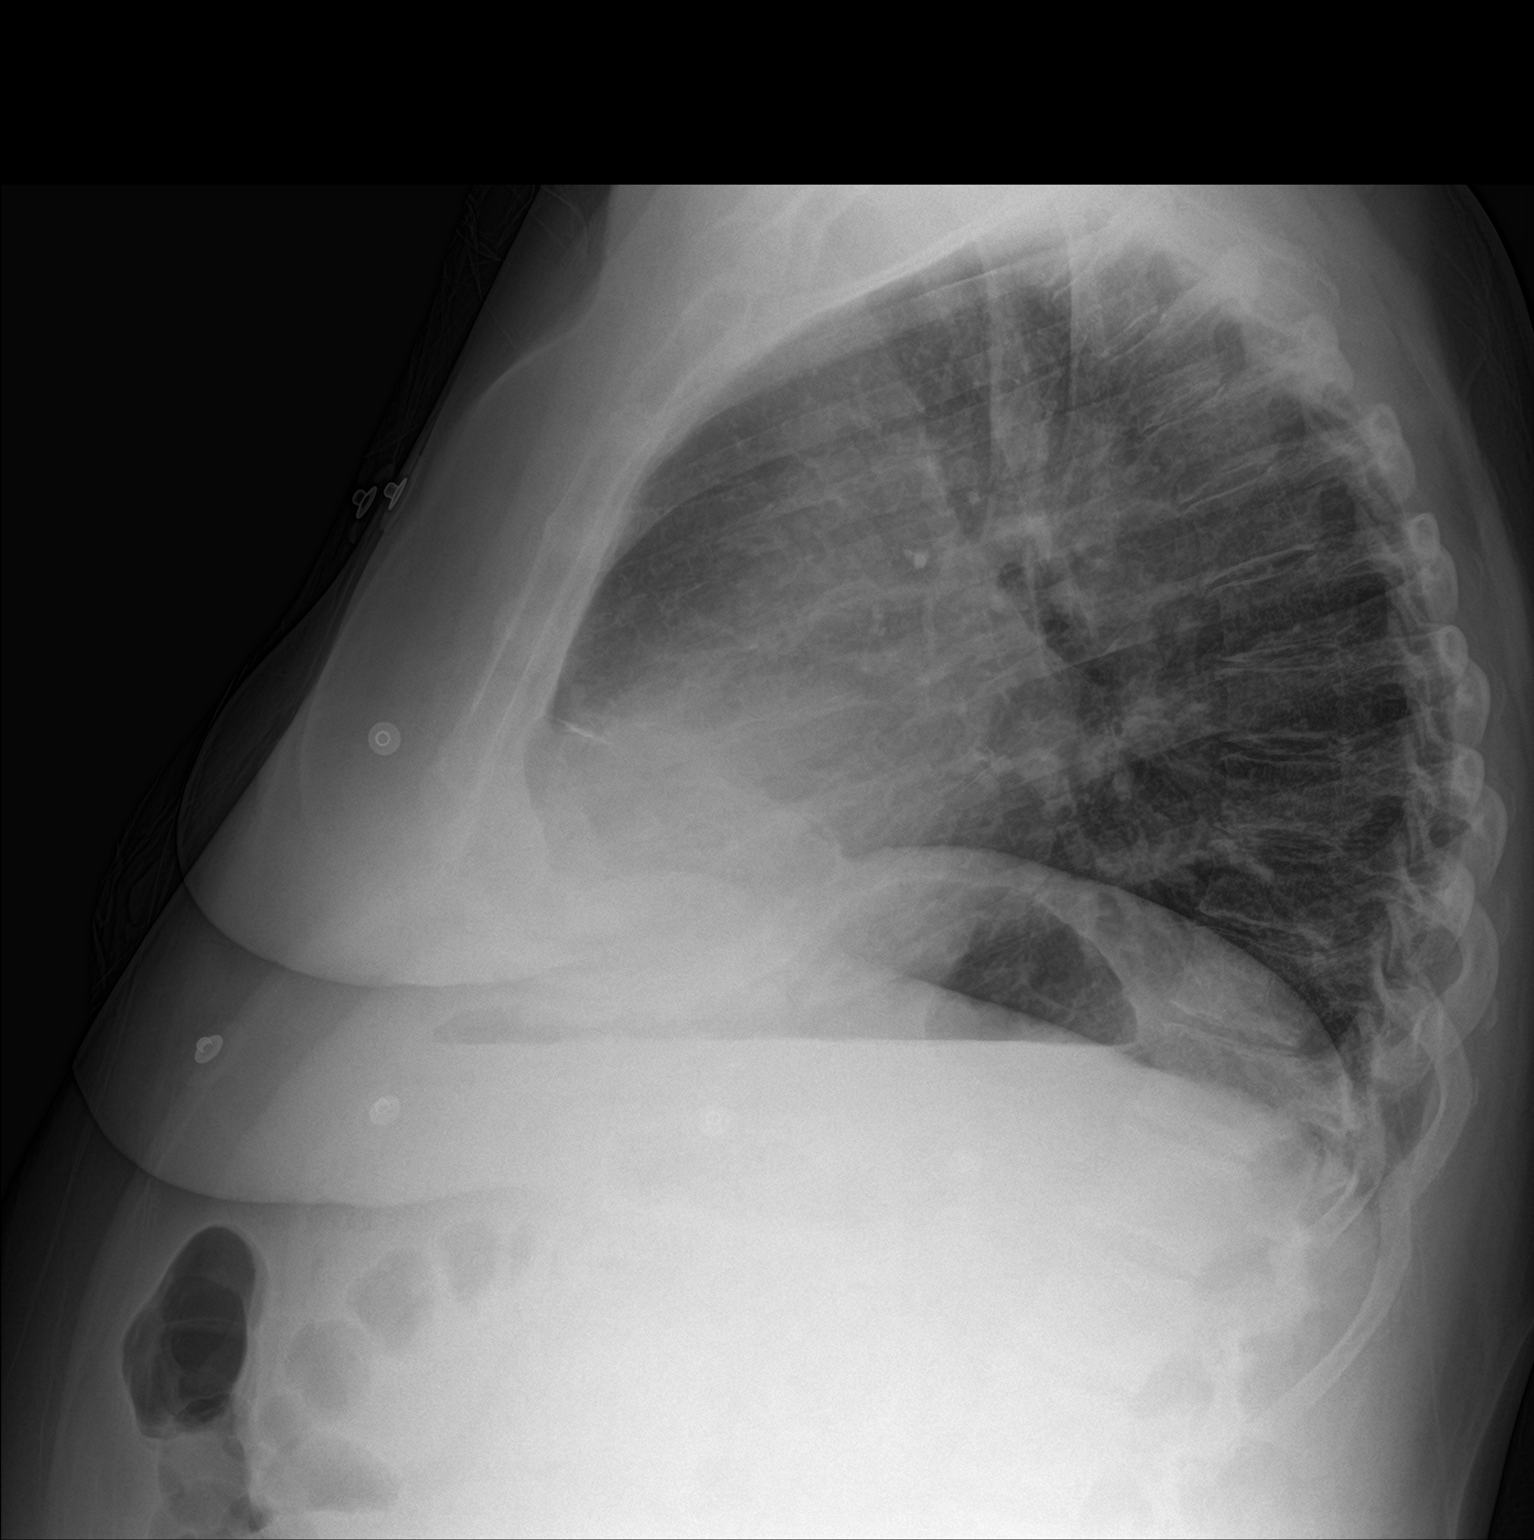

[2 of 2 positions shown; findings below may reference images not displayed]

FINDINGS: The heart size and mediastinal contours are within normal limits.
Both lungs are clear. The visualized skeletal structures are
unremarkable.
IMPRESSION: No active cardiopulmonary disease.

## 2016-07-07 ENCOUNTER — Other Ambulatory Visit (HOSPITAL_COMMUNITY): Payer: Self-pay | Admitting: Adult Health

## 2016-07-07 ENCOUNTER — Other Ambulatory Visit: Payer: Self-pay | Admitting: Family Medicine

## 2016-07-07 ENCOUNTER — Ambulatory Visit (HOSPITAL_COMMUNITY)
Admission: RE | Admit: 2016-07-07 | Discharge: 2016-07-07 | Disposition: A | Discharge status: Home / Self Care | Payer: Medicare Other | Source: Ambulatory Visit | Attending: Internal Medicine | Admitting: Internal Medicine

## 2016-07-07 VITALS — BP 150/86 | HR 81 | Wt 249.8 lb

## 2016-07-07 DIAGNOSIS — Z87891 Personal history of nicotine dependence: Secondary | ICD-10-CM | POA: Diagnosis not present

## 2016-07-07 DIAGNOSIS — Z79899 Other long term (current) drug therapy: Secondary | ICD-10-CM | POA: Diagnosis not present

## 2016-07-07 DIAGNOSIS — I779 Disorder of arteries and arterioles, unspecified: Secondary | ICD-10-CM | POA: Diagnosis not present

## 2016-07-07 DIAGNOSIS — Z7951 Long term (current) use of inhaled steroids: Secondary | ICD-10-CM | POA: Diagnosis not present

## 2016-07-07 DIAGNOSIS — E039 Hypothyroidism, unspecified: Secondary | ICD-10-CM | POA: Diagnosis not present

## 2016-07-07 DIAGNOSIS — K7460 Unspecified cirrhosis of liver: Secondary | ICD-10-CM | POA: Diagnosis not present

## 2016-07-07 DIAGNOSIS — R001 Bradycardia, unspecified: Secondary | ICD-10-CM

## 2016-07-07 DIAGNOSIS — Z720 Tobacco use: Secondary | ICD-10-CM

## 2016-07-07 DIAGNOSIS — I4891 Unspecified atrial fibrillation: Secondary | ICD-10-CM | POA: Insufficient documentation

## 2016-07-07 DIAGNOSIS — Z794 Long term (current) use of insulin: Secondary | ICD-10-CM | POA: Diagnosis not present

## 2016-07-07 DIAGNOSIS — N183 Chronic kidney disease, stage 3 (moderate): Secondary | ICD-10-CM | POA: Insufficient documentation

## 2016-07-07 DIAGNOSIS — I130 Hypertensive heart and chronic kidney disease with heart failure and stage 1 through stage 4 chronic kidney disease, or unspecified chronic kidney disease: Secondary | ICD-10-CM | POA: Insufficient documentation

## 2016-07-07 DIAGNOSIS — J449 Chronic obstructive pulmonary disease, unspecified: Secondary | ICD-10-CM | POA: Diagnosis not present

## 2016-07-07 DIAGNOSIS — I480 Paroxysmal atrial fibrillation: Secondary | ICD-10-CM

## 2016-07-07 DIAGNOSIS — I5043 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) and diastolic (congestive) heart failure: Secondary | ICD-10-CM

## 2016-07-07 DIAGNOSIS — Z7901 Long term (current) use of anticoagulants: Secondary | ICD-10-CM | POA: Insufficient documentation

## 2016-07-07 DIAGNOSIS — L0390 Cellulitis, unspecified: Secondary | ICD-10-CM | POA: Diagnosis not present

## 2016-07-07 DIAGNOSIS — Z8249 Family history of ischemic heart disease and other diseases of the circulatory system: Secondary | ICD-10-CM | POA: Diagnosis not present

## 2016-07-07 DIAGNOSIS — Z9981 Dependence on supplemental oxygen: Secondary | ICD-10-CM | POA: Diagnosis not present

## 2016-07-07 DIAGNOSIS — E1122 Type 2 diabetes mellitus with diabetic chronic kidney disease: Secondary | ICD-10-CM | POA: Diagnosis not present

## 2016-07-07 DIAGNOSIS — I5022 Chronic systolic (congestive) heart failure: Secondary | ICD-10-CM | POA: Diagnosis present

## 2016-07-07 DIAGNOSIS — I10 Essential (primary) hypertension: Secondary | ICD-10-CM

## 2016-07-07 DIAGNOSIS — I739 Peripheral vascular disease, unspecified: Secondary | ICD-10-CM | POA: Diagnosis not present

## 2016-07-07 LAB — BASIC METABOLIC PANEL
ANION GAP: 11 (ref 5–15)
BUN: 28 mg/dL — ABNORMAL HIGH (ref 6–20)
CALCIUM: 9.8 mg/dL (ref 8.9–10.3)
CHLORIDE: 98 mmol/L — AB (ref 101–111)
CO2: 27 mmol/L (ref 22–32)
Creatinine, Ser: 1.2 mg/dL — ABNORMAL HIGH (ref 0.44–1.00)
GFR calc non Af Amer: 51 mL/min — ABNORMAL LOW (ref >60)
GFR, EST AFRICAN AMERICAN: 59 mL/min — AB (ref >60)
Glucose, Bld: 280 mg/dL — ABNORMAL HIGH (ref 65–99)
Potassium: 4.5 mmol/L (ref 3.5–5.1)
SODIUM: 136 mmol/L (ref 135–145)

## 2016-07-07 MED ORDER — ATORVASTATIN CALCIUM 40 MG PO TABS: 40.0000 mg | ORAL_TABLET | Freq: Every evening | ORAL | 2 refills | Status: DC

## 2016-07-07 MED ORDER — METOLAZONE 2.5 MG PO TABS: 2.5000 mg | ORAL_TABLET | ORAL | 3 refills | Status: DC

## 2016-07-07 MED ORDER — LOSARTAN POTASSIUM 25 MG PO TABS: 25.0000 mg | ORAL_TABLET | Freq: Every day | ORAL | 3 refills | Status: DC

## 2016-07-07 NOTE — Progress Notes (Addendum)
Advanced Heart Failure Medication Review by a Pharmacist  Does the patient  feel that his/her medications are working for him/her?  yes  Has the patient been experiencing any side effects to the medications prescribed?  no  Does the patient measure his/her own blood pressure or blood glucose at home?  yes   Does the patient have any problems obtaining medications due to transportation or finances?   no  Understanding of regimen: fair Understanding of indications: fair Potential of compliance: good Patient understands to avoid NSAIDs. Patient understands to avoid decongestants.  Issues to address at subsequent visits: None   Pharmacist comments:  [name] is a pleasant 55 yo F presenting with her medication bottles and [name] (paramedicine). She reports great compliance with her regimen but did state that she has run out of metolazone and won't be able to afford her refill until next Friday (#4 for $7 at [facility]). I did call [facility] who stated that #4 tablets would only be $5. This is the pharmacy she uses for all of her other medications and they deliver so will send the Rx to them instead. No other medication-related questions or concerns for me at this time.   [name]. [name], PharmD, BCPS, CPP Clinical Pharmacist Pager: [phone] Phone: [phone] 07/07/2016 12:34 PM      Time with patient: 10 minutes Preparation and documentation time: 6 minutes Total time: 16 minutes

## 2016-07-07 NOTE — Progress Notes (Signed)
Patient ID: [name], female   DOB: 1961/05/27, 55 y.o.   MRN: [id number]    Advanced Heart Failure Clinic Note   PCP: Dr Roxy Manns HF Cardiology: Dr. Aundra Dubin PV: Dr. Gwenlyn Found  55 yo with history of PAD, carotid stenosis s/p left CEA, relatively mild CAD, chronic systolic CHF, paroxysmal atrial fibrillation and prior substance abuse presents for CHF clinic followup.  She was admitted in 1/17 with acute hypoxemic respiratory failure in the setting of atrial fibrillation/RVR and volume overload.  She was initially intubated.  She converted back to NSR with amiodarone gtt.  She was treated with IV Lasix, steroids, bronchodilators.  She developed AKI and losartan was stopped.    She was admitted in 3/17 with symptomatic bradycardia.  She was supposed to stop diltiazem after this appointment but continued to take it.  She presented later in 3/17, again with symptomatic bradycardia (junctional bradycardia), hypotension, and AKI.  Diltiazem and bisoprolol were stopped.  HR recovered and is actually elevated today, rate around 100 bpm.  ACEI was stopped with AKI.   She presents today for regular follow up. Feeling good overall. Taking metolazone every Thursday, but took last pill today. Drinks > 2 L most days. Eating well. Eats grilled chicken most days, made at home with no salt added.  Limited more by claudication than DOE.  No SOB with ADLs or on flat ground. Avoids stairs.  Does pee more on metolazone days.  Weights at home stable around 247 lbs for the most part.  Continues to try and stop smoking. Only smoking a "drag" or two from her sisters. Paramedicine following.   Labs (1/17): K 5.2, creatinine 1.4, AST 43, ALT normal Labs (2/17): K 4.2, creatinine 1.3, BNP 607, TSH normal Labs (3/17): K 4.2, creatinine 1.45, AST/ALT normal, HCT 28.7 Labs (4/17): K 4.4, creatinine 1.61  PMH: 1. Carotid stenosis: Known occluded right carotid.  Left CEA in 4/16.  2. CAD: LHC in 12/15 with 80% stenosis in small  OM1, nonobstructive disease in other territories.  3. Chronic systolic CHF: Nonischemic cardiomyopathy (?due to ETOH or prior drug abuse).  - Echo (1/17) with EF 45%, mild LV hypertrophy, moderate diastolic dysfunction, inferolateral severe hypokinesis, mildly decreased RV systolic function.  4. Atrial fibrillation: Paroxysmal.   5. Type II diabetes 6. CKD: Stage III. 7. COPD: Quit smoking 1/17. On home oxygen.  8. 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  No longer drinks.  9. Hypothyroidism 10. PAD: Atherectomy SFA in 2014 (Dr Gwenlyn Found).  Peripheral arterial dopplers (2/17) with focal 75-99% proximal right SFA stenosis, occluded mid-distal right SFA, chronic occlusion of all runoff arteries on right. 11. Anemia 12. Prior cocaine abuse.  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   SH: Lives with sister.  Prior cocaine abuse.  Prior ETOH abuse.  Quit smoking in 1/17.  FH: CAD  ROS: All systems reviewed and negative except as per HPI.   Current Outpatient Prescriptions  Medication Sig Dispense Refill  . acetaminophen-codeine (TYLENOL #3) 300-30 MG tablet Take 1 tablet by mouth 2 (two) times daily as needed for moderate pain.    [name] albuterol (PROVENTIL HFA;VENTOLIN HFA) 108 (90 Base) MCG/ACT inhaler Inhale 2 puffs into the lungs every 6 (six) hours as needed for wheezing or shortness of breath. 1 each 3  . albuterol (PROVENTIL) (2.5 MG/3ML) 0.083% nebulizer solution Take 3 mLs (2.5 mg total) by nebulization every 6 (six) hours as needed. For wheezing. 75 mL 2  . amiodarone (  PACERONE) 100 MG tablet Take 100 mg by mouth daily.    [name] amLODipine (NORVASC) 10 MG tablet Take 1 tablet (10 mg total) by mouth every evening. 30 tablet 6  . atorvastatin (LIPITOR) 40 MG tablet Take 1 tablet (40 mg total) by mouth every evening. 30 tablet 2  . bisoprolol (ZEBETA) 5 MG tablet Take 0.5 tablets (2.5 mg total) by mouth every evening. 15 tablet 3  . Blood Glucose Monitoring Suppl  (ACCU-CHEK AVIVA PLUS) W/DEVICE KIT 1 Device by Does not apply route 4 (four) times daily -  before meals and at bedtime. 1 kit 0  . budesonide-formoterol (SYMBICORT) 160-4.5 MCG/ACT inhaler Inhale 2 puffs into the lungs 2 (two) times daily. 1 Inhaler 12  . cholecalciferol (VITAMIN D) 1000 units tablet Take 1,000 Units by mouth daily.    . cyanocobalamin 1000 MCG tablet Take 1 tablet (1,000 mcg total) by mouth daily. 30 tablet 10  . DULoxetine (CYMBALTA) 30 MG capsule Take 30 mg by mouth daily.    . folic acid (FOLVITE) 1 MG tablet TAKE ONE TABLET BY MOUTH DAILY. 90 tablet 1  . insulin aspart (NOVOLOG FLEXPEN) 100 UNIT/ML FlexPen Inject 24-44 Units into the skin 3 (three) times daily with meals.     . Insulin Glargine (LANTUS SOLOSTAR) 100 UNIT/ML Solostar Pen Inject 64 Units into the skin daily at 10 pm.     . Insulin Pen Needle (ULTICARE MICRO PEN NEEDLES) 32G X 4 MM MISC 1 Syringe by Does not apply route 4 (four) times daily - after meals and at bedtime. 1 each 11  . Lancets (ACCU-CHEK SOFT TOUCH) lancets Use as instructed 100 each 12  . levothyroxine (SYNTHROID, LEVOTHROID) 50 MCG tablet Take 1 tablet (50 mcg total) by mouth daily. 30 tablet 2  . losartan (COZAAR) 25 MG tablet Take 0.5 tablets (12.5 mg total) by mouth daily. 45 tablet 3  . metolazone (ZAROXOLYN) 2.5 MG tablet Take 1 tablet (2.5 mg total) by mouth as directed. WEEKLY ON THURSDAY 10 tablet 3  . potassium chloride SA (K-DUR,KLOR-CON) 20 MEQ tablet Take 30 mEq by mouth daily. Take an additional 30 meq (1 and 1/2 tablets) on Thursday with metolazone    . torsemide (DEMADEX) 20 MG tablet Take 4 tablets (80 mg total) by mouth daily. Take extra 4 tabs as directed 180 tablet 3  . warfarin (COUMADIN) 5 MG tablet TAKE 1 TABLET TO 1 AND 1/2 TABLETS BY MOUTH EVERY DAY AS DIRECTED BY COUMADIN CLINIC 45 tablet 2  . gabapentin (NEURONTIN) 300 MG capsule Take 300 mg by mouth 4 (four) times daily.    . nitroGLYCERIN (NITROSTAT) 0.4 MG SL tablet  Place 1 tablet (0.4 mg total) under the tongue every 5 (five) minutes as needed for chest pain. (Patient not taking: Reported on 07/07/2016) 25 tablet 3   No current facility-administered medications for this encounter.      Vitals:   07/07/16 1206  BP: (!) 150/86  Pulse: 81  SpO2: 94%  Weight: 249 lb 12.8 oz (113.3 kg)   Wt Readings from Last 3 Encounters:  07/07/16 249 lb 12.8 oz (113.3 kg)  06/09/16 245 lb (111.1 kg)  05/11/16 250 lb 12.8 oz (113.8 kg)     General: NAD Neck: Thick, JVP difficult to assess, does not appear elevated. No thyromegaly or nodule noted.   Lungs: CTAB, normal effort CV: Nondisplaced PMI.  Heart regular S1/S2, no S3/S4, no murmur.  No carotid bruit.  Unable to palpate pedal pulses.  Trace  right radial pulse, 2+ left radial pulse.  Abdomen: Obese, soft, NT, ND, no HSM. No bruits or masses. +BS  Skin: Intact without lesions or rashes.  Neurologic: Alert and oriented x 3.   Psych: Normal affect. Extremities: No clubbing or cyanosis. UNNA boots in place, Trace ankle edema at most.  HEENT: Normal.   Assessment/Plan: 1. Chronic systolic CHF: EF 93% on last echo. Has been presumed nonischemic given LHC in 12/15 showing only 80% stenosis in small OM1.  - Volume status stable. NYHA Class II-III - Continue torsemide 80 mg daily.  - Continue metolazone 2.5 mg Thursdays with extra 20 meq of potassium.  BMET today.  - Continue bisoprolol 2.5 mg daily.  - Increase losartan 25 mg daily.  BMET today and 2 weeks.  - Encouraged to limit fluid and sodium intake.  2. Atrial fibrillation:  - remains in in NSR on amiodarone.  She is on warfarin (taken off NOAC with fluctuation in creatinine).  - Continue amiodarone 100 daily.  PCP following hypothyroidism (on Levoxyl).  AST/ALT were normal in 3/17, check at next visit.  Will need regular eye exams. 3. PAD: Bilateral calf claudication seems quit limiting.  No rest pain or pedal ulcers.  Peripheral arterial dopplers show  severe disease on right.  Not candidate for cilostazol with CHF.  Not good candidate for intervention per Dr Gwenlyn Found (seen recently).  4. COPD: On home oxygen.   - Smoking very little, encouraged to stop completely.  5. H/o cirrhosis: Likely from ETOH, no longer drinks.  6. Suspected OSA:  - Needs sleep study.  7. CKD stage III: - BMET today.  8. HTN:  - Stable on current meds and adjusting diuretics, no further changes.  9. Bradycardia: Junctional rhythm when hospitalized in 3/17.   - Stable on low dose bisoprolol 2.5 mg daily.  Will not increase. - HR stable in 80s.  10. Poorly healing leg wounds - Following with wound clinic with [facility] in [facility]. On ABX for cellulitis.     Volume status stable. Encouraged to cut back further on fluids(drinking more than 2 L daily).  BMET today and 2 weeks with increased Losartan.  Follow up 6 weeks.    Satira Mccallum Beatric Fulop PA-C 07/07/2016  Total time spent > 25 minutes. Over half that spent discussing the above.

## 2016-07-07 NOTE — Patient Instructions (Signed)
Lab today and again in 7-14 days  INCREASE Losartan to 25 mg , one tab daily  Your physician recommends that you schedule a follow-up appointment in: 6 weeks  Do the following things EVERYDAY: 1) Weigh yourself in the morning before breakfast. Write it down and keep it in a log. 2) Take your medicines as prescribed 3) Eat low salt foods-Limit salt (sodium) to 2000 mg per day.  4) Stay as active as you can everyday 5) Limit all fluids for the day to less than 2 liters

## 2016-07-22 ENCOUNTER — Ambulatory Visit (HOSPITAL_COMMUNITY)
Admission: RE | Admit: 2016-07-22 | Discharge: 2016-07-22 | Disposition: A | Discharge status: Home / Self Care | Payer: Medicare Other | Source: Ambulatory Visit | Attending: Cardiology | Admitting: Cardiology

## 2016-07-22 DIAGNOSIS — I5022 Chronic systolic (congestive) heart failure: Secondary | ICD-10-CM | POA: Diagnosis not present

## 2016-07-22 LAB — BASIC METABOLIC PANEL
Anion gap: 10 (ref 5–15)
BUN: 29 mg/dL — ABNORMAL HIGH (ref 6–20)
CALCIUM: 9.6 mg/dL (ref 8.9–10.3)
CO2: 24 mmol/L (ref 22–32)
CREATININE: 1.33 mg/dL — AB (ref 0.44–1.00)
Chloride: 103 mmol/L (ref 101–111)
GFR calc non Af Amer: 45 mL/min — ABNORMAL LOW (ref >60)
GFR, EST AFRICAN AMERICAN: 52 mL/min — AB (ref >60)
GLUCOSE: 267 mg/dL — AB (ref 65–99)
Potassium: 4.2 mmol/L (ref 3.5–5.1)
Sodium: 137 mmol/L (ref 135–145)

## 2016-07-22 IMAGING — CR DG CHEST 1V PORT
1 series · 1 of 1 positions shown · non-contrast
Comparison: Chest radiograph dated 10/20/2015

CLINICAL DATA: 54-year-old female with shortness of breath

EXAM:
PORTABLE CHEST 1 VIEW

[AP]
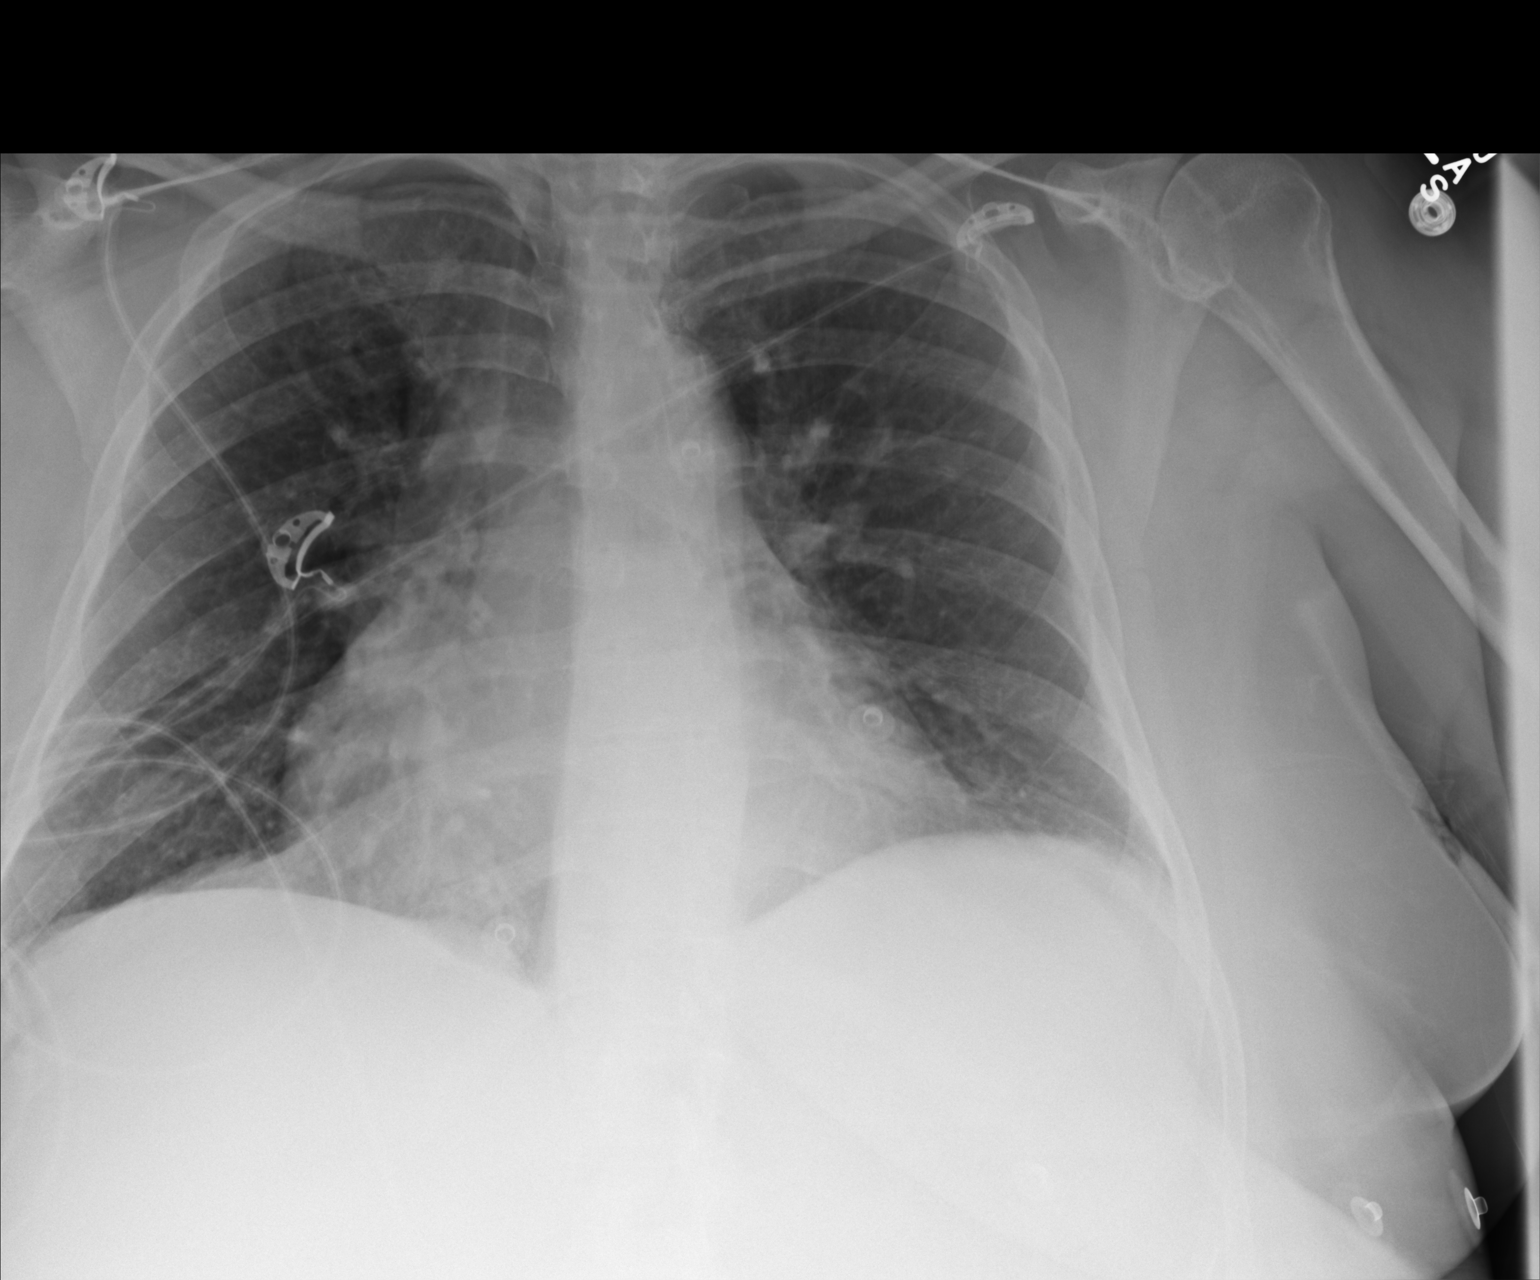

[1 of 1 positions shown; findings below may reference images not displayed]

FINDINGS: Single-view of the chest demonstrates minimal bibasilar dependent
atelectatic changes. No focal consolidation, pleural effusion,
pneumothorax. Stable cardiac silhouette. The osseous structures
appear unremarkable.
IMPRESSION: No active disease.

## 2016-07-23 IMAGING — CR DG CHEST 2V
2 series · 2 of 2 positions shown · non-contrast
Comparison: Single view of the chest 11/07/2015. PA and lateral
chest 10/20/2015.

CLINICAL DATA: Fever today.  Cough and congestion.

EXAM:
CHEST  2 VIEW

[chest pa]
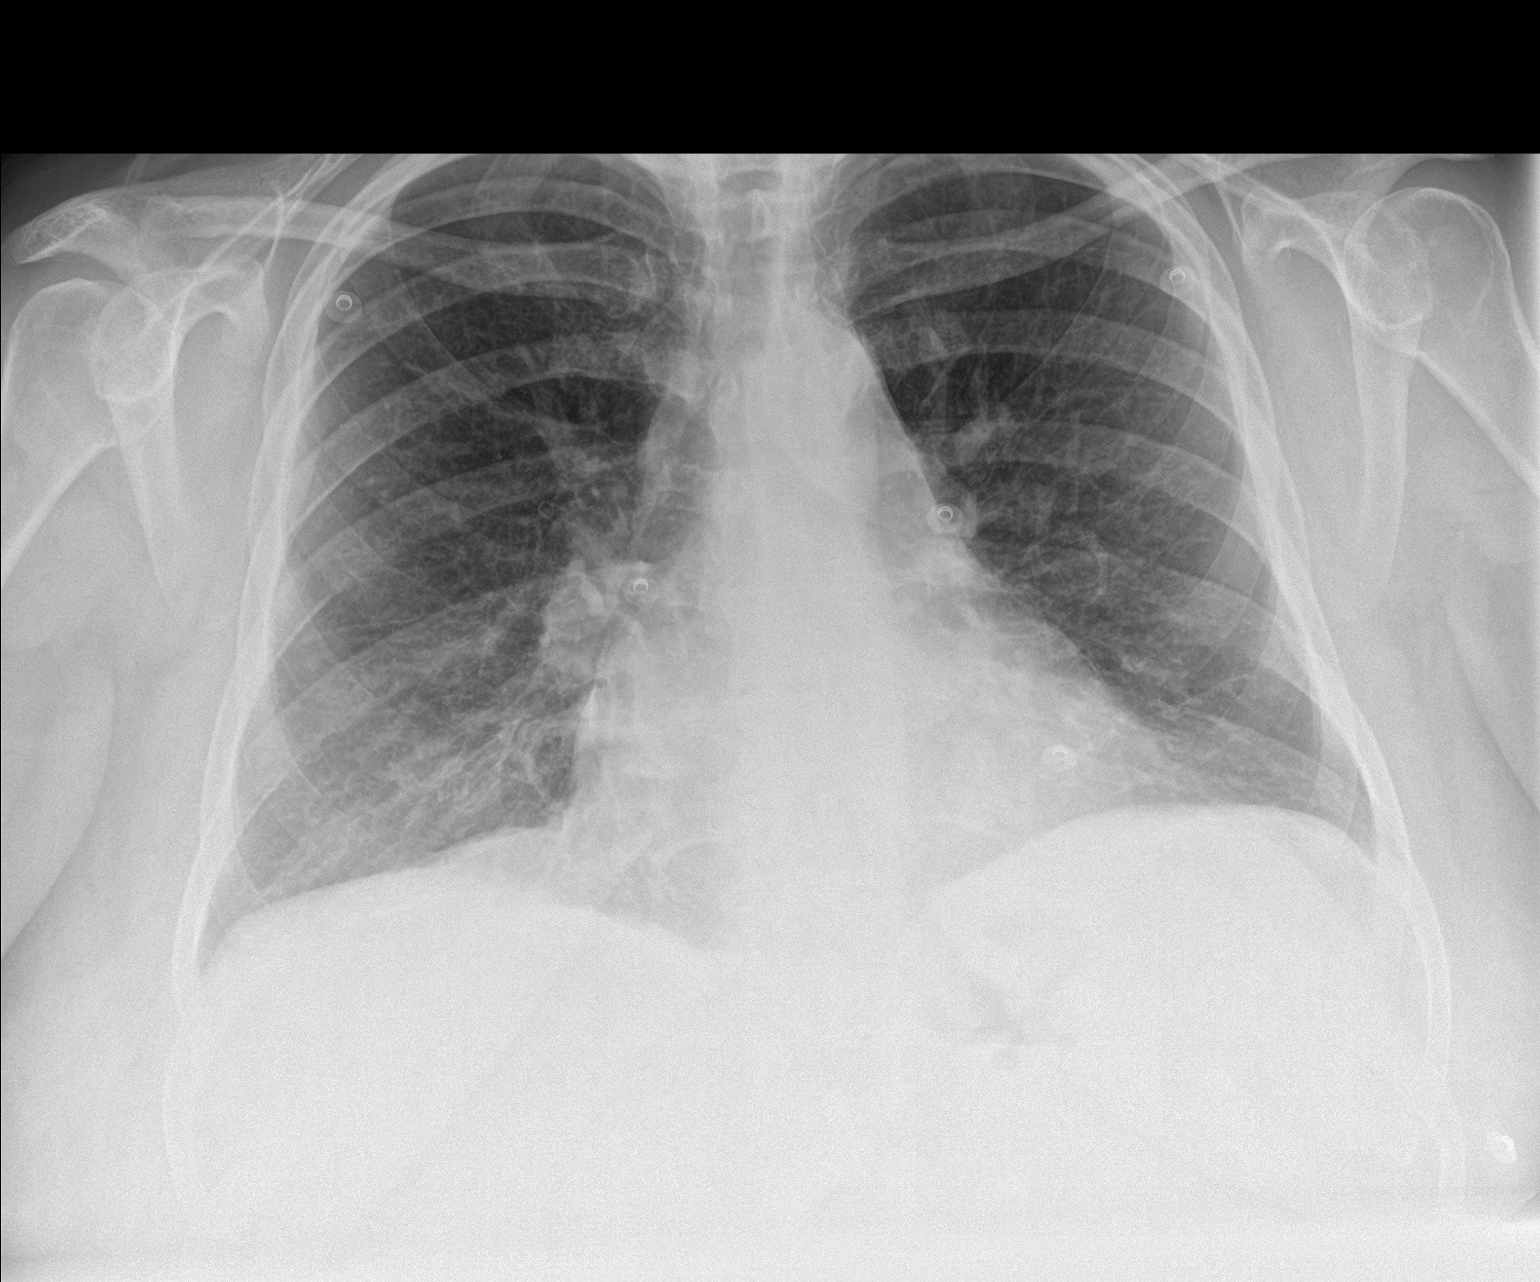

[chest lat]
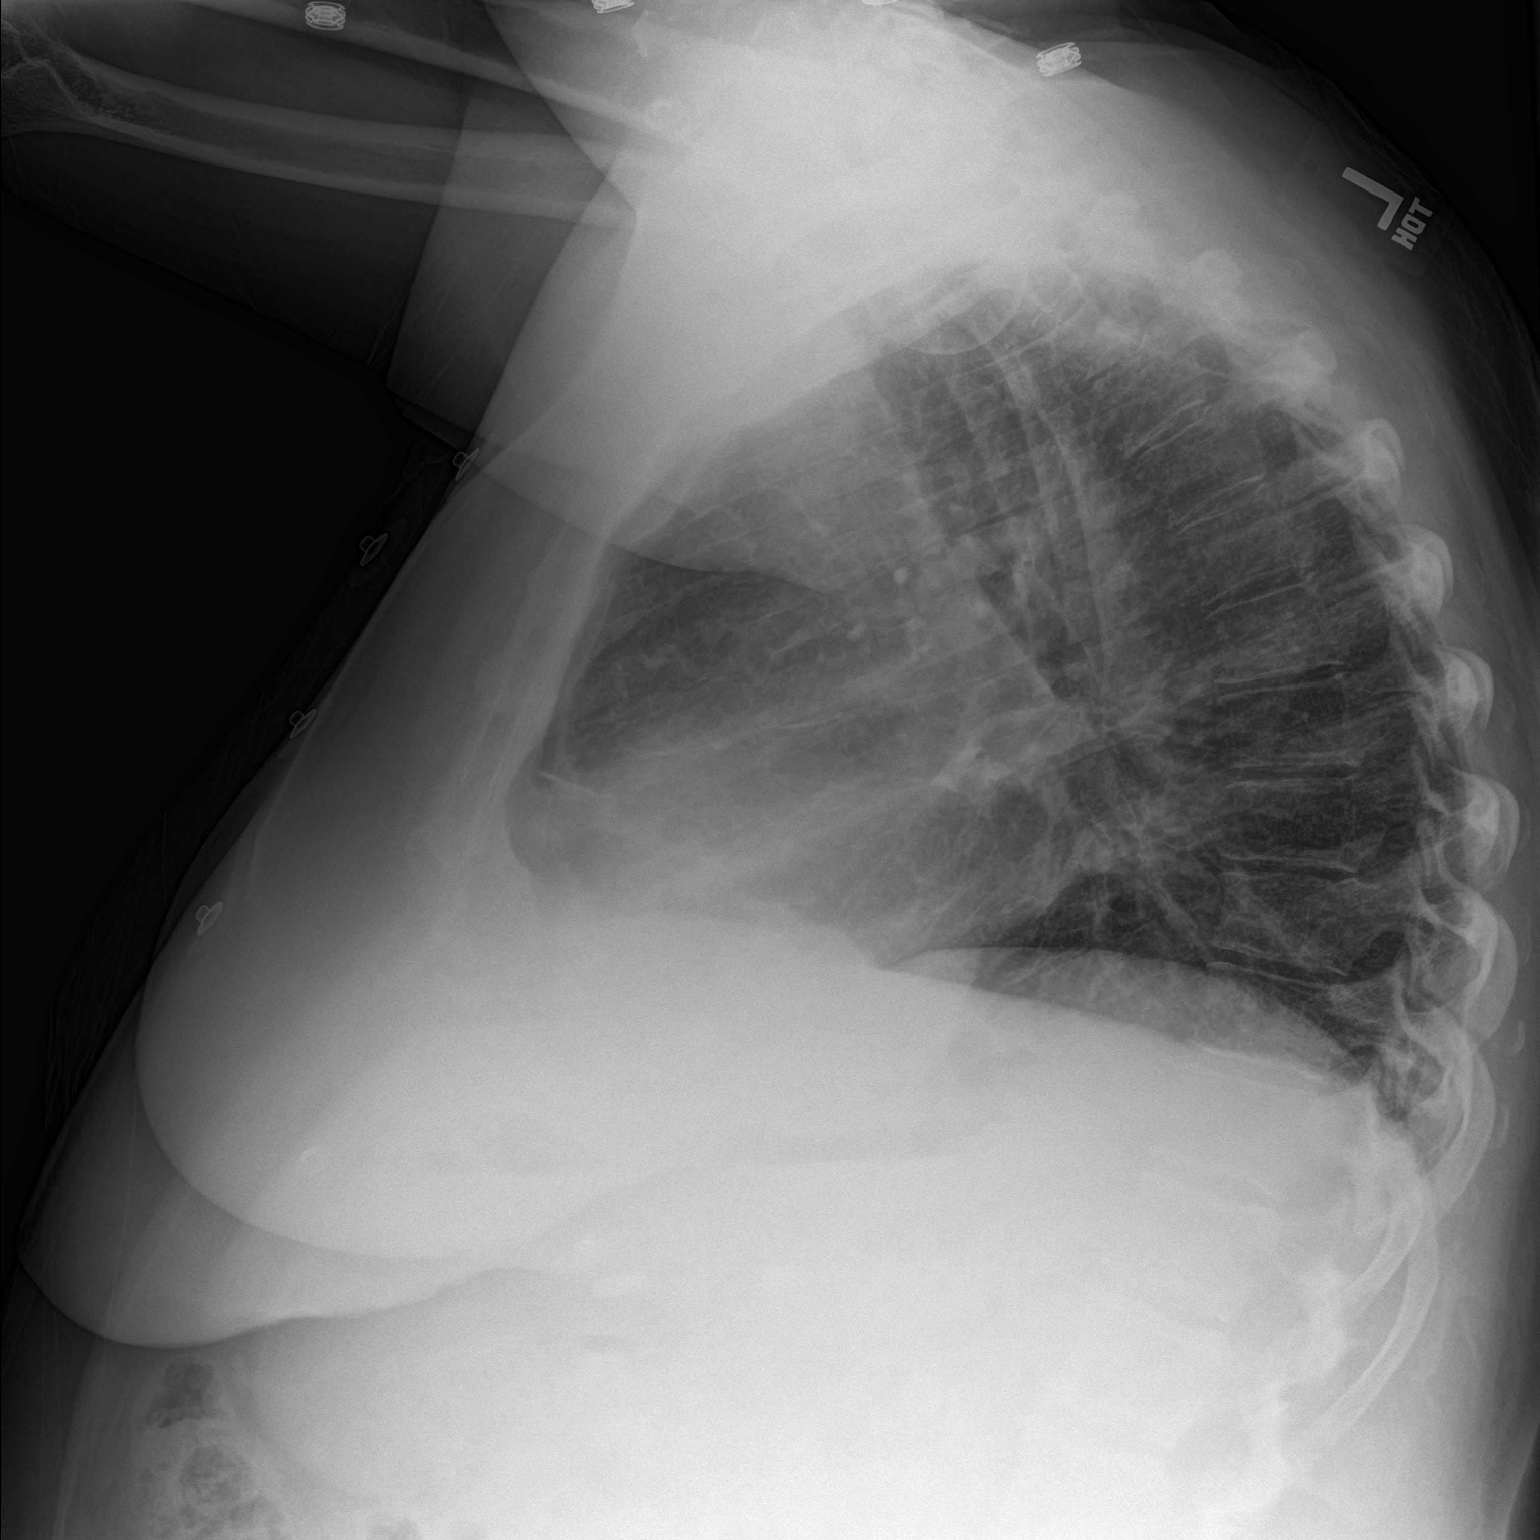

[2 of 2 positions shown; findings below may reference images not displayed]

FINDINGS: The lungs are clear. Heart size normal. No pneumothorax or pleural
effusion. No focal bony abnormality.
IMPRESSION: No acute disease.

## 2016-07-25 ENCOUNTER — Ambulatory Visit (INDEPENDENT_AMBULATORY_CARE_PROVIDER_SITE_OTHER): Payer: Medicare Other | Admitting: Pharmacist

## 2016-07-25 DIAGNOSIS — I480 Paroxysmal atrial fibrillation: Secondary | ICD-10-CM

## 2016-07-25 DIAGNOSIS — Z5181 Encounter for therapeutic drug level monitoring: Secondary | ICD-10-CM

## 2016-07-25 DIAGNOSIS — I4891 Unspecified atrial fibrillation: Secondary | ICD-10-CM

## 2016-07-25 LAB — POCT INR: INR: 3.4

## 2016-08-02 IMAGING — DX DG CHEST 2V
2 series · 2 of 2 positions shown · non-contrast
Comparison: 11/08/2015

CLINICAL DATA: Upper midline chest pain

EXAM:
CHEST  2 VIEW

[w chest pa]
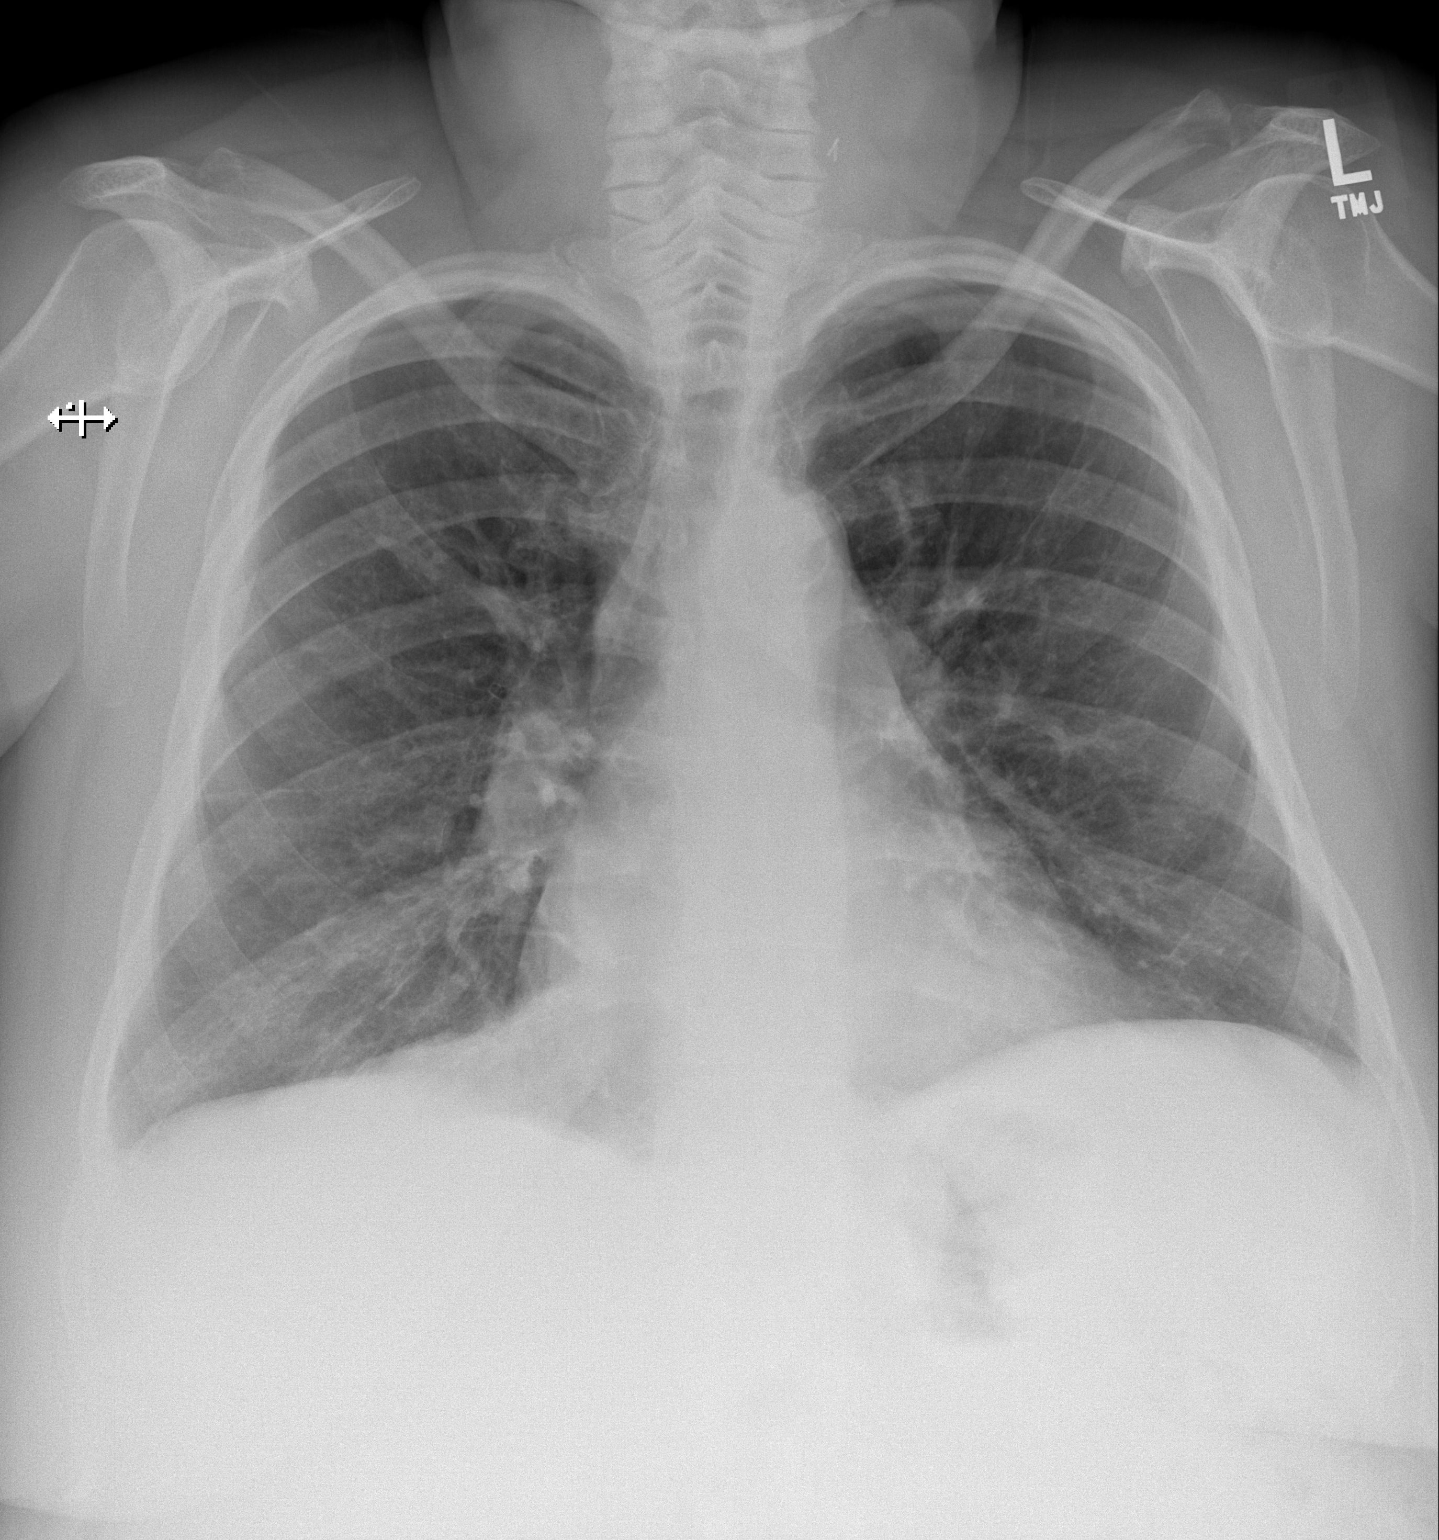

[w chest lat]
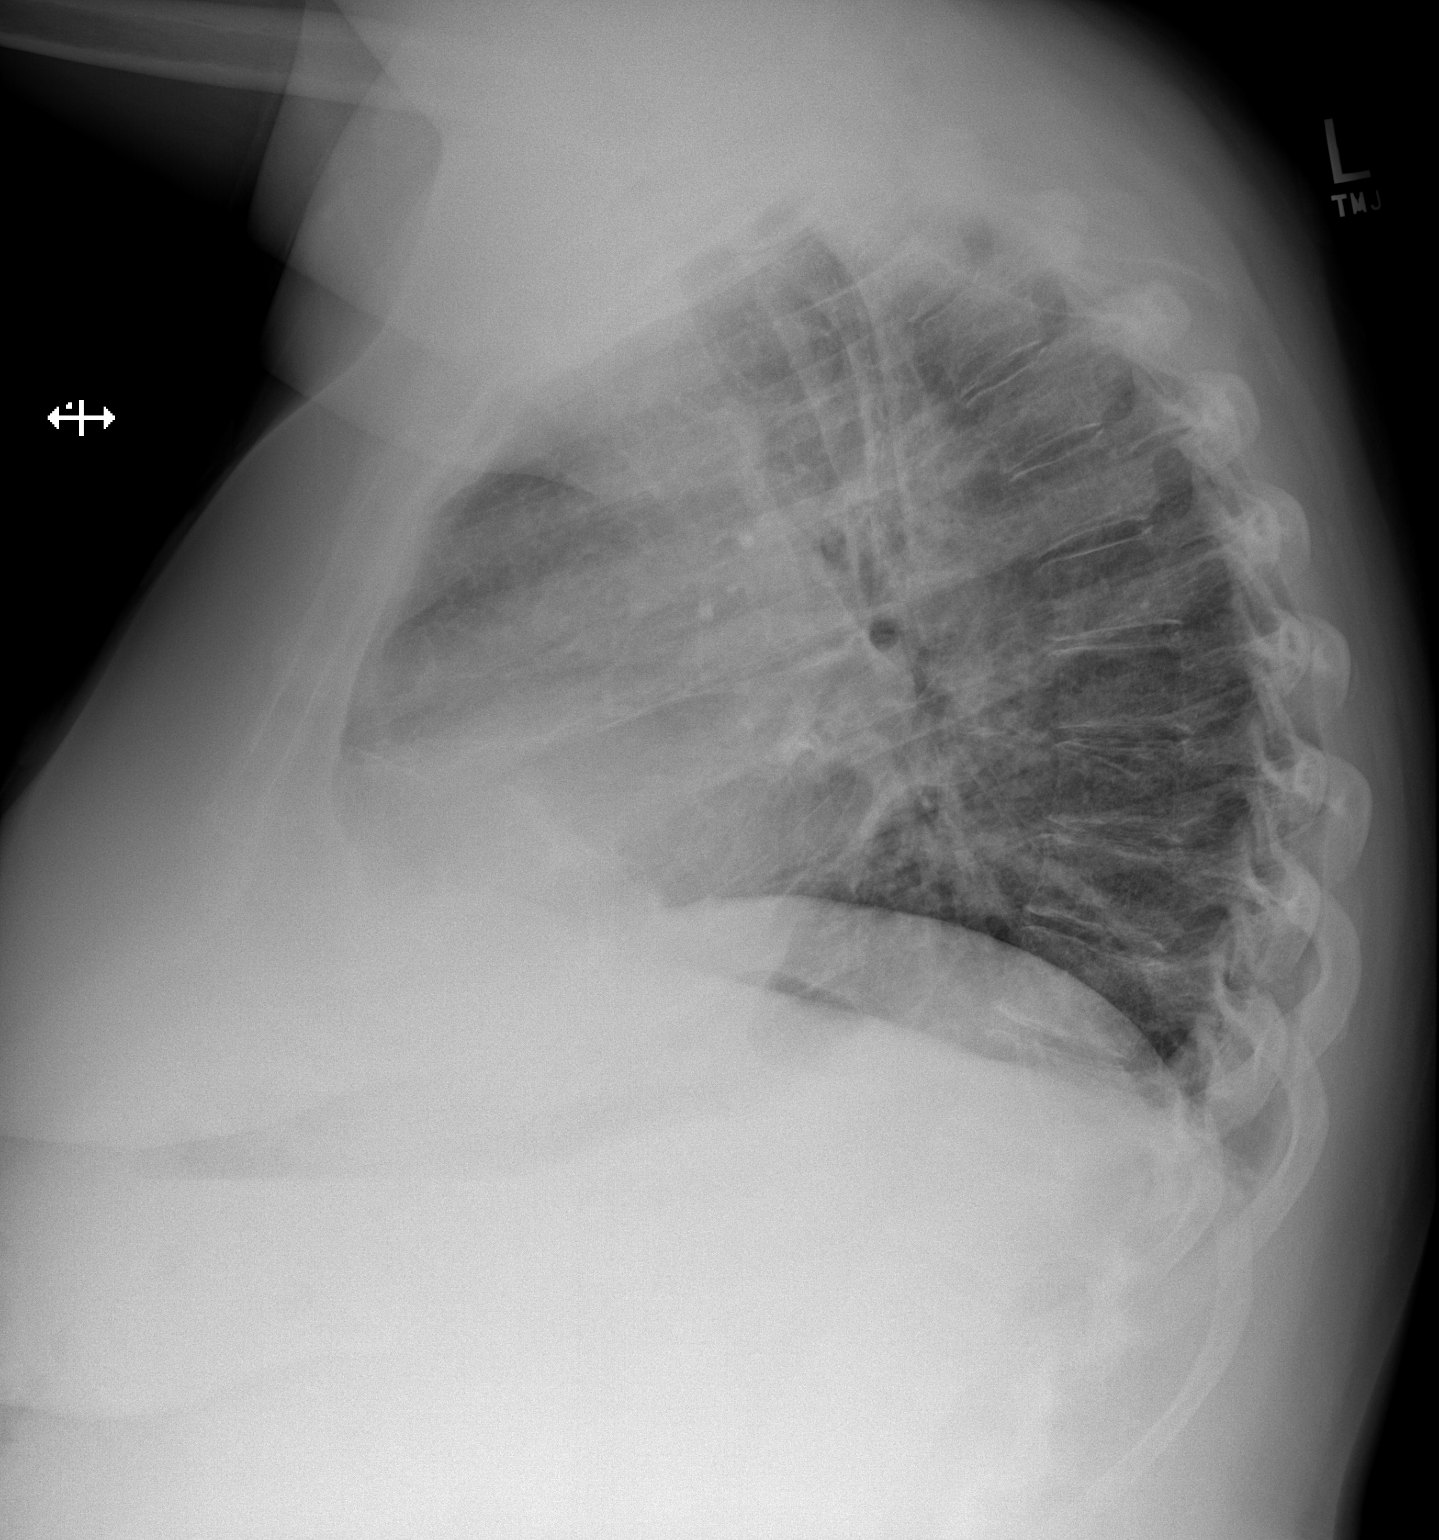

[2 of 2 positions shown; findings below may reference images not displayed]

FINDINGS: Normal heart size and mediastinal contours. No acute infiltrate or
edema. No effusion or pneumothorax. No acute osseous findings.
IMPRESSION: No active cardiopulmonary disease.

## 2016-08-03 ENCOUNTER — Other Ambulatory Visit: Payer: Self-pay | Admitting: Family Medicine

## 2016-08-04 ENCOUNTER — Telehealth (HOSPITAL_COMMUNITY): Payer: Self-pay

## 2016-08-04 NOTE — Telephone Encounter (Signed)
[name] with paramedicine called to report patient had weight gain over past 2 weeks of 8 lbs (248 to 253 to 256 lbs). Patient did take prn metolazone today.  Patient asymptomatic, vss, no swelling noted.  Patient will call us tomorrow to update weight.  [name], RN

## 2016-08-05 ENCOUNTER — Other Ambulatory Visit: Payer: Self-pay | Admitting: Family Medicine

## 2016-08-07 IMAGING — DX DG CHEST 2V
2 series · 2 of 2 positions shown · non-contrast
Comparison: November 18, 2015.

CLINICAL DATA: Shortness of breath.

EXAM:
CHEST  2 VIEW

[chest pa]
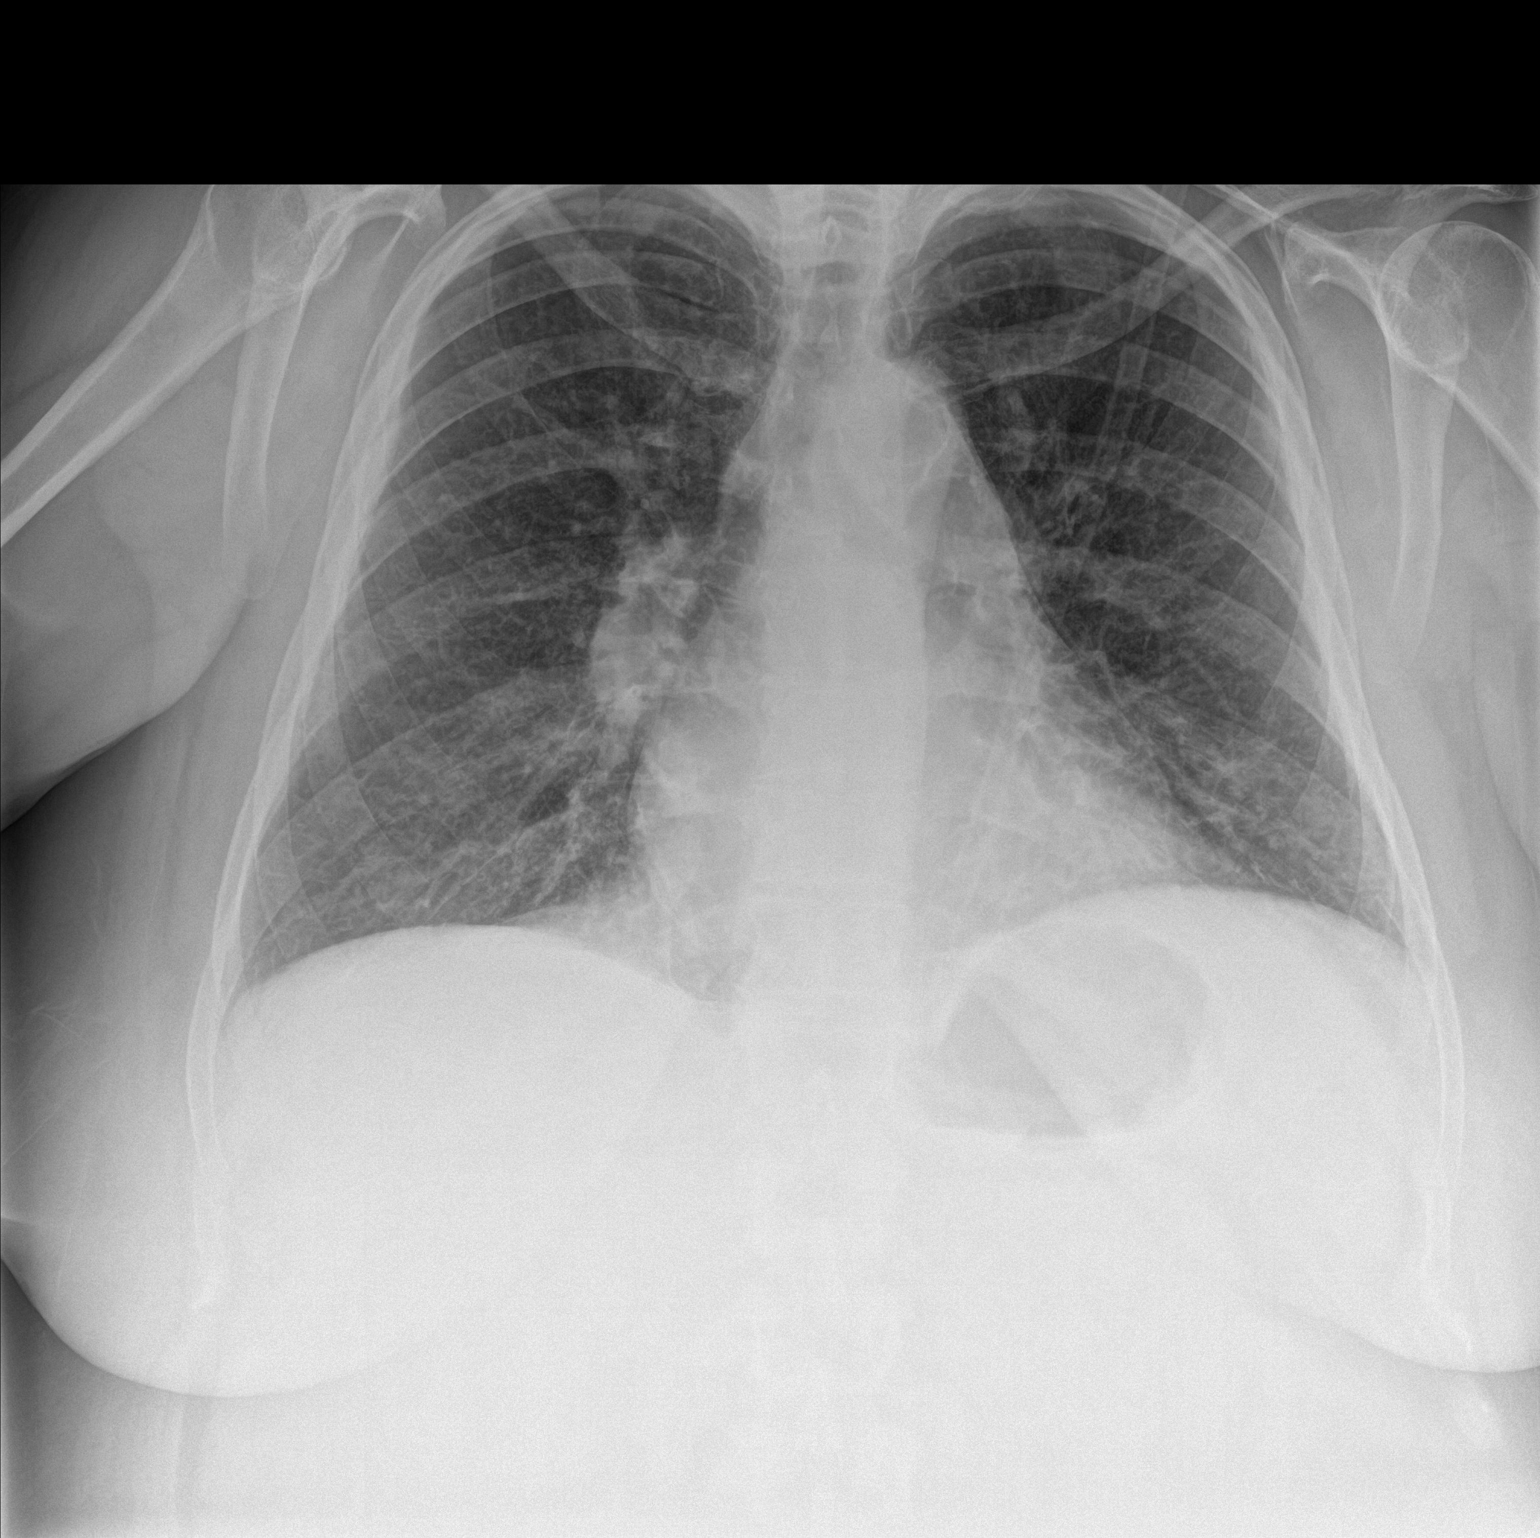

[chest lat]
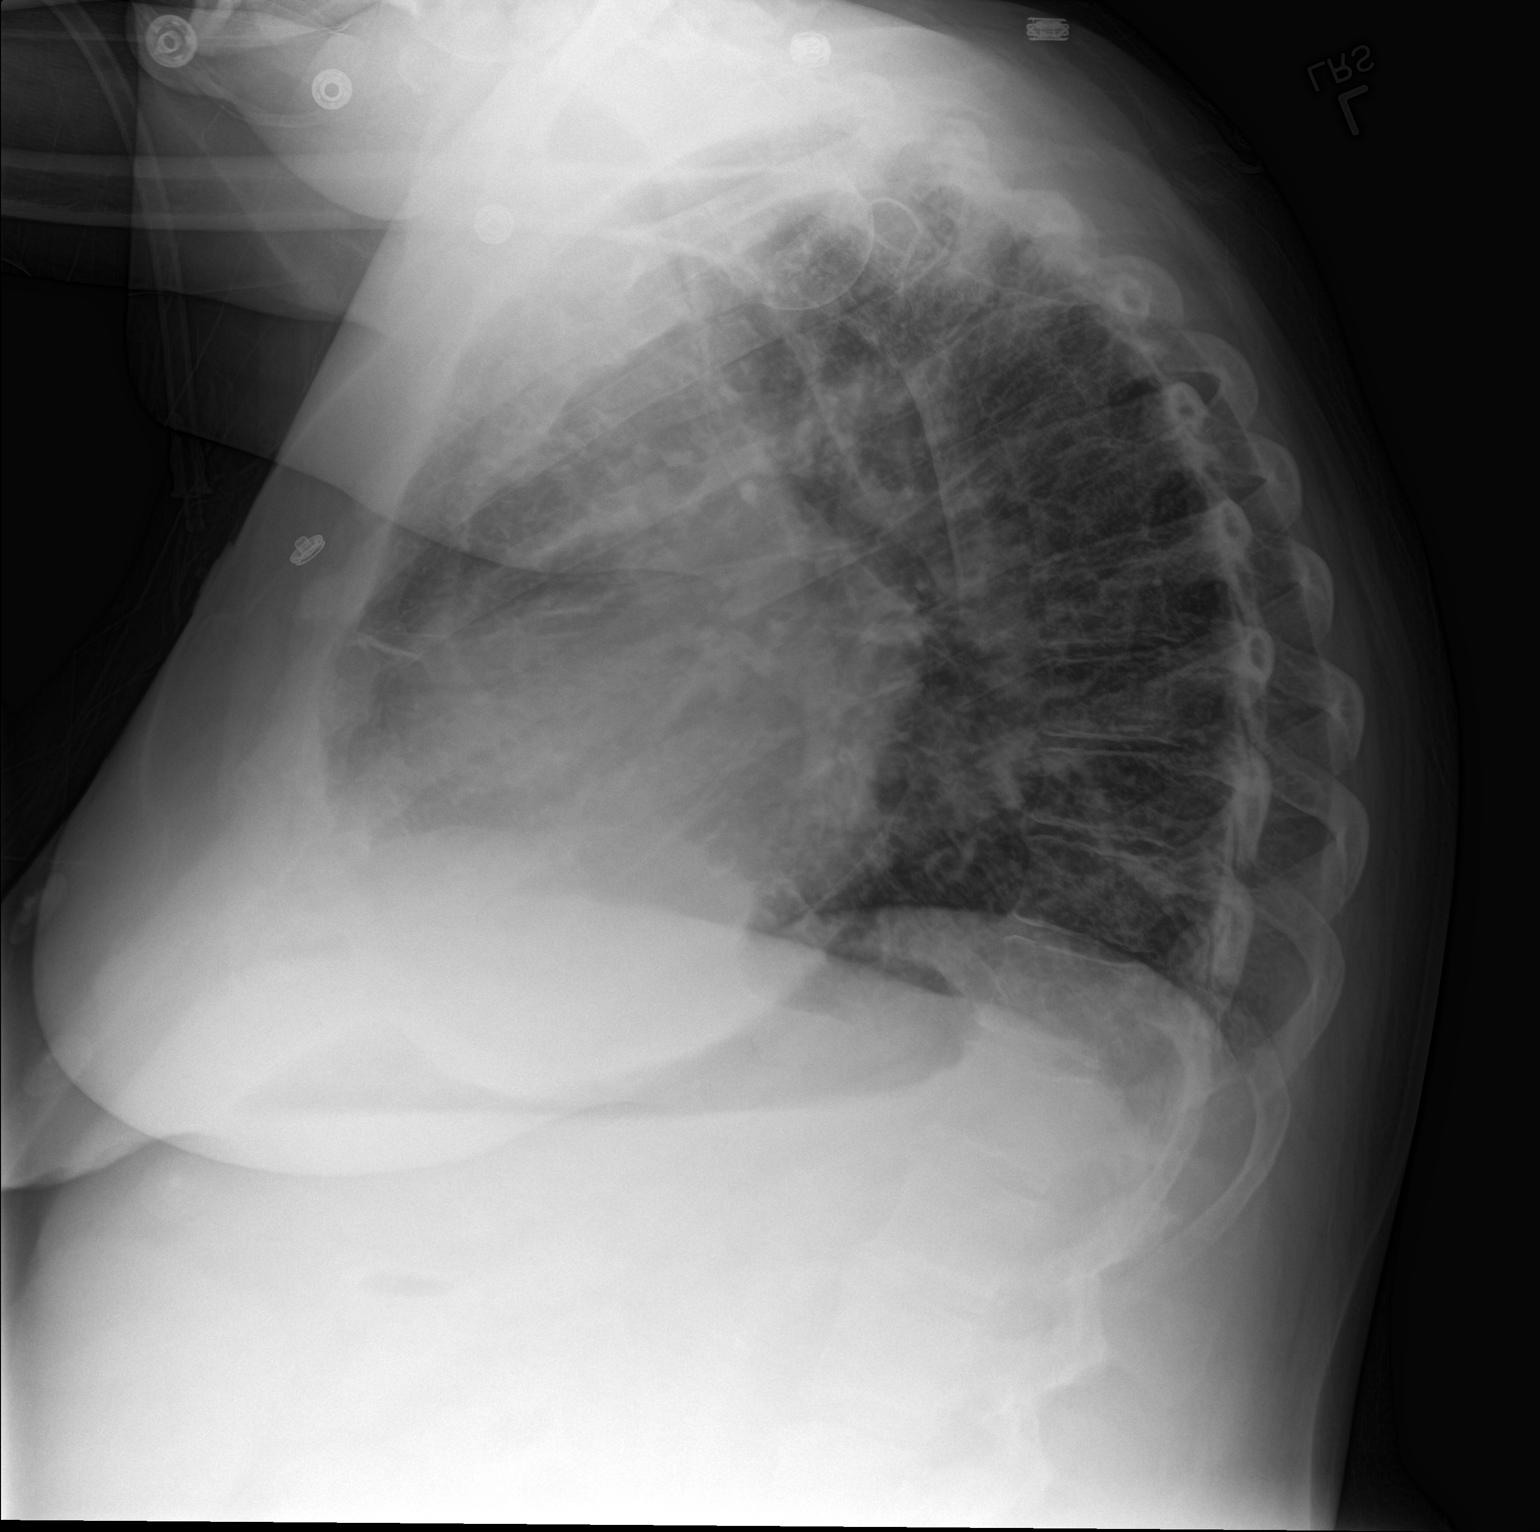

[2 of 2 positions shown; findings below may reference images not displayed]

FINDINGS: The heart size and mediastinal contours are within normal limits. No
pneumothorax pleural effusion is noted. Mildly increased bibasilar
interstitial opacities are noted concerning for edema or
subsegmental atelectasis. The visualized skeletal structures are
unremarkable.
IMPRESSION: Mild bibasilar subsegmental atelectasis or possibly edema.

## 2016-08-08 IMAGING — CR DG CHEST 1V PORT
1 series · 1 of 1 positions shown · non-contrast
Comparison: 11/23/2015

CLINICAL DATA: Status post intubation and central line placement

EXAM:
PORTABLE CHEST - 1 VIEW

[portable]
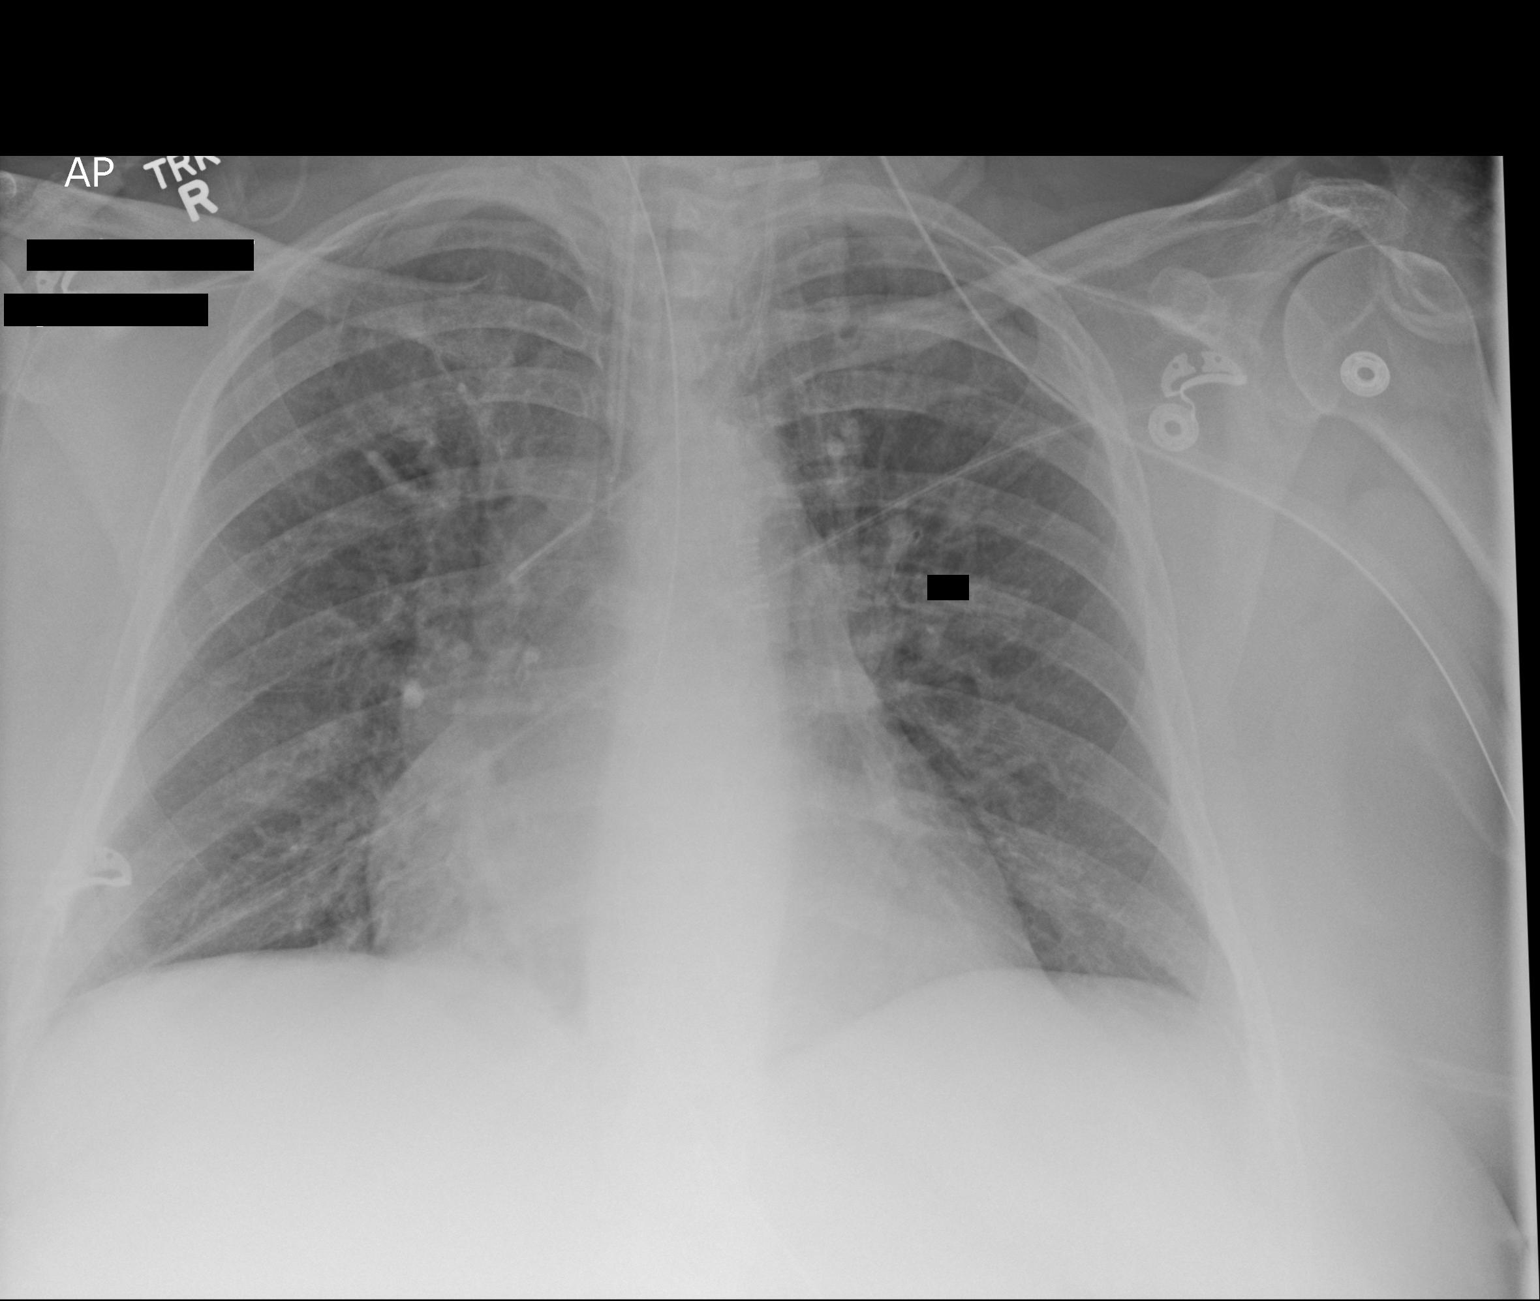

[1 of 1 positions shown; findings below may reference images not displayed]

FINDINGS: Endotracheal tube is now seen approximately 2.9 cm above the carina.
A nasogastric catheter courses towards the stomach. Left central
venous line is noted in the proximal superior vena cava. No
pneumothorax is noted. Cardiac shadow remains enlarged accentuated
by the portable technique. The lungs are clear bilaterally.
IMPRESSION: Tubes and lines as described above in satisfactory position.

No acute abnormality noted.

## 2016-08-08 IMAGING — CR DG ABD PORTABLE 1V
1 series · 1 of 1 positions shown · non-contrast
Comparison: None.

CLINICAL DATA: Status post gastric catheter placement

EXAM:
PORTABLE ABDOMEN - 1 VIEW

[supine ap]
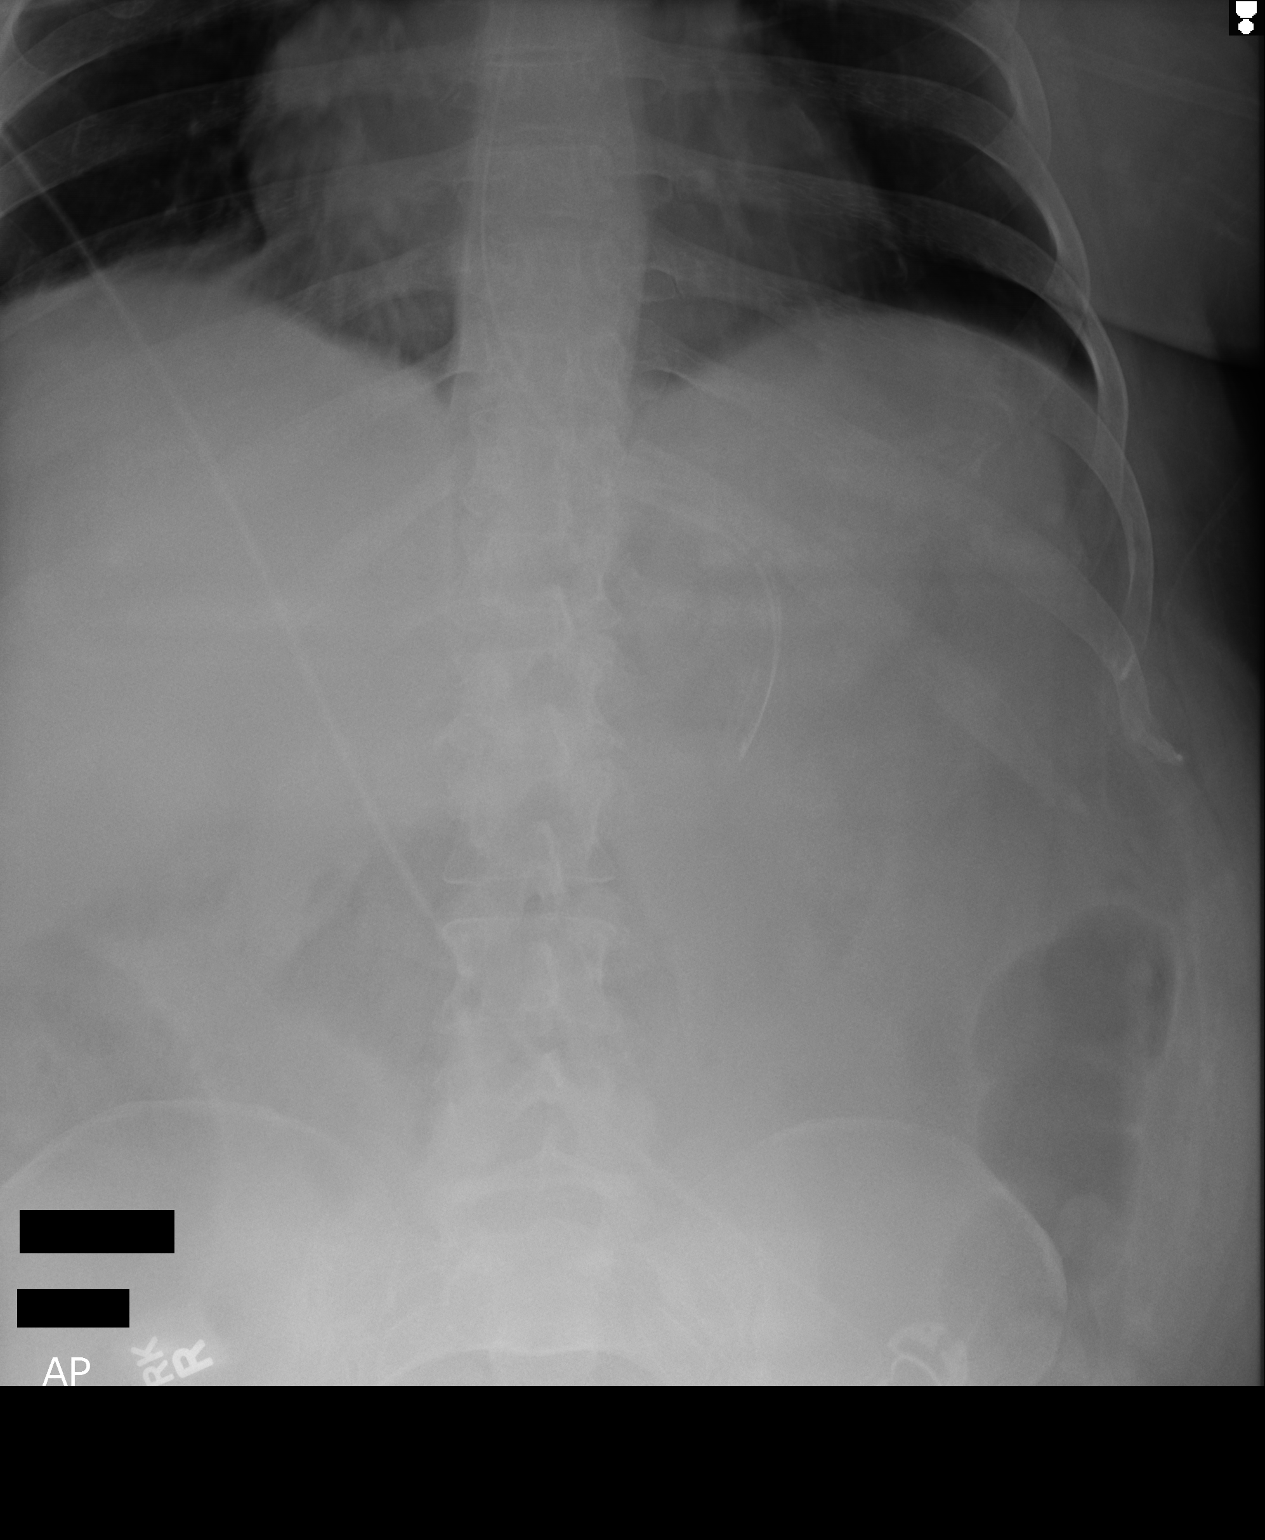

[1 of 1 positions shown; findings below may reference images not displayed]

FINDINGS: Gastric catheter is noted within the stomach. Scattered large and
small bowel gas is noted. No obstructive changes are seen. No free
air is noted.
IMPRESSION: Gastric catheter within the stomach.

## 2016-08-09 IMAGING — CR DG CHEST 1V PORT
1 series · 1 of 1 positions shown · non-contrast
Comparison: 11/24/2015.

CLINICAL DATA: Acute respiratory failure.

EXAM:
PORTABLE CHEST 1 VIEW

[AP]
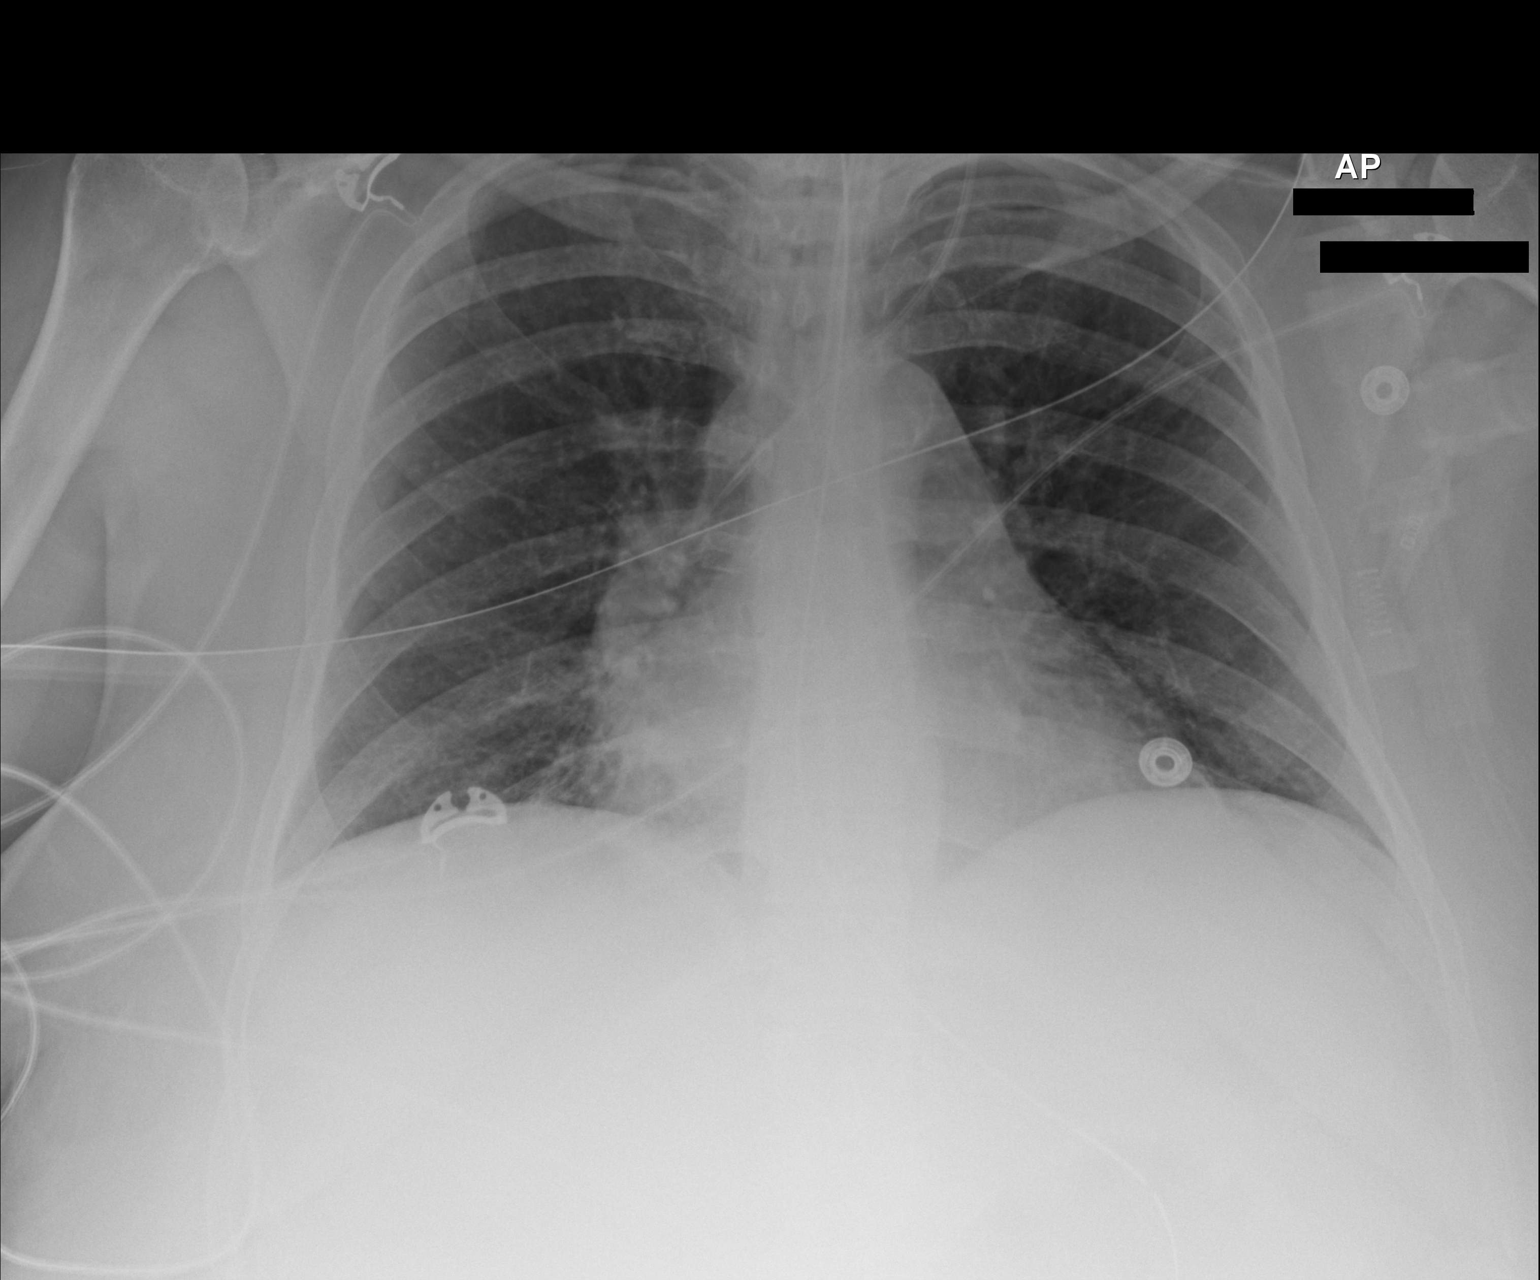

[1 of 1 positions shown; findings below may reference images not displayed]

FINDINGS: Endotracheal tube, left IJ line, NG tube and good anatomic position.
Right base subsegmental atelectasis and or mild infiltrate. No
pleural effusion or pneumothorax. Heart size stable. No acute bony
abnormality .
IMPRESSION: 1. Lines and tubes in stable position.
2. Right base subsegmental atelectasis and or mild infiltrate.

## 2016-08-10 IMAGING — CR DG CHEST 1V PORT
1 series · 1 of 1 positions shown · non-contrast
Comparison: 11/25/2015.

CLINICAL DATA: CHF.

EXAM:
PORTABLE CHEST 1 VIEW

[AP]
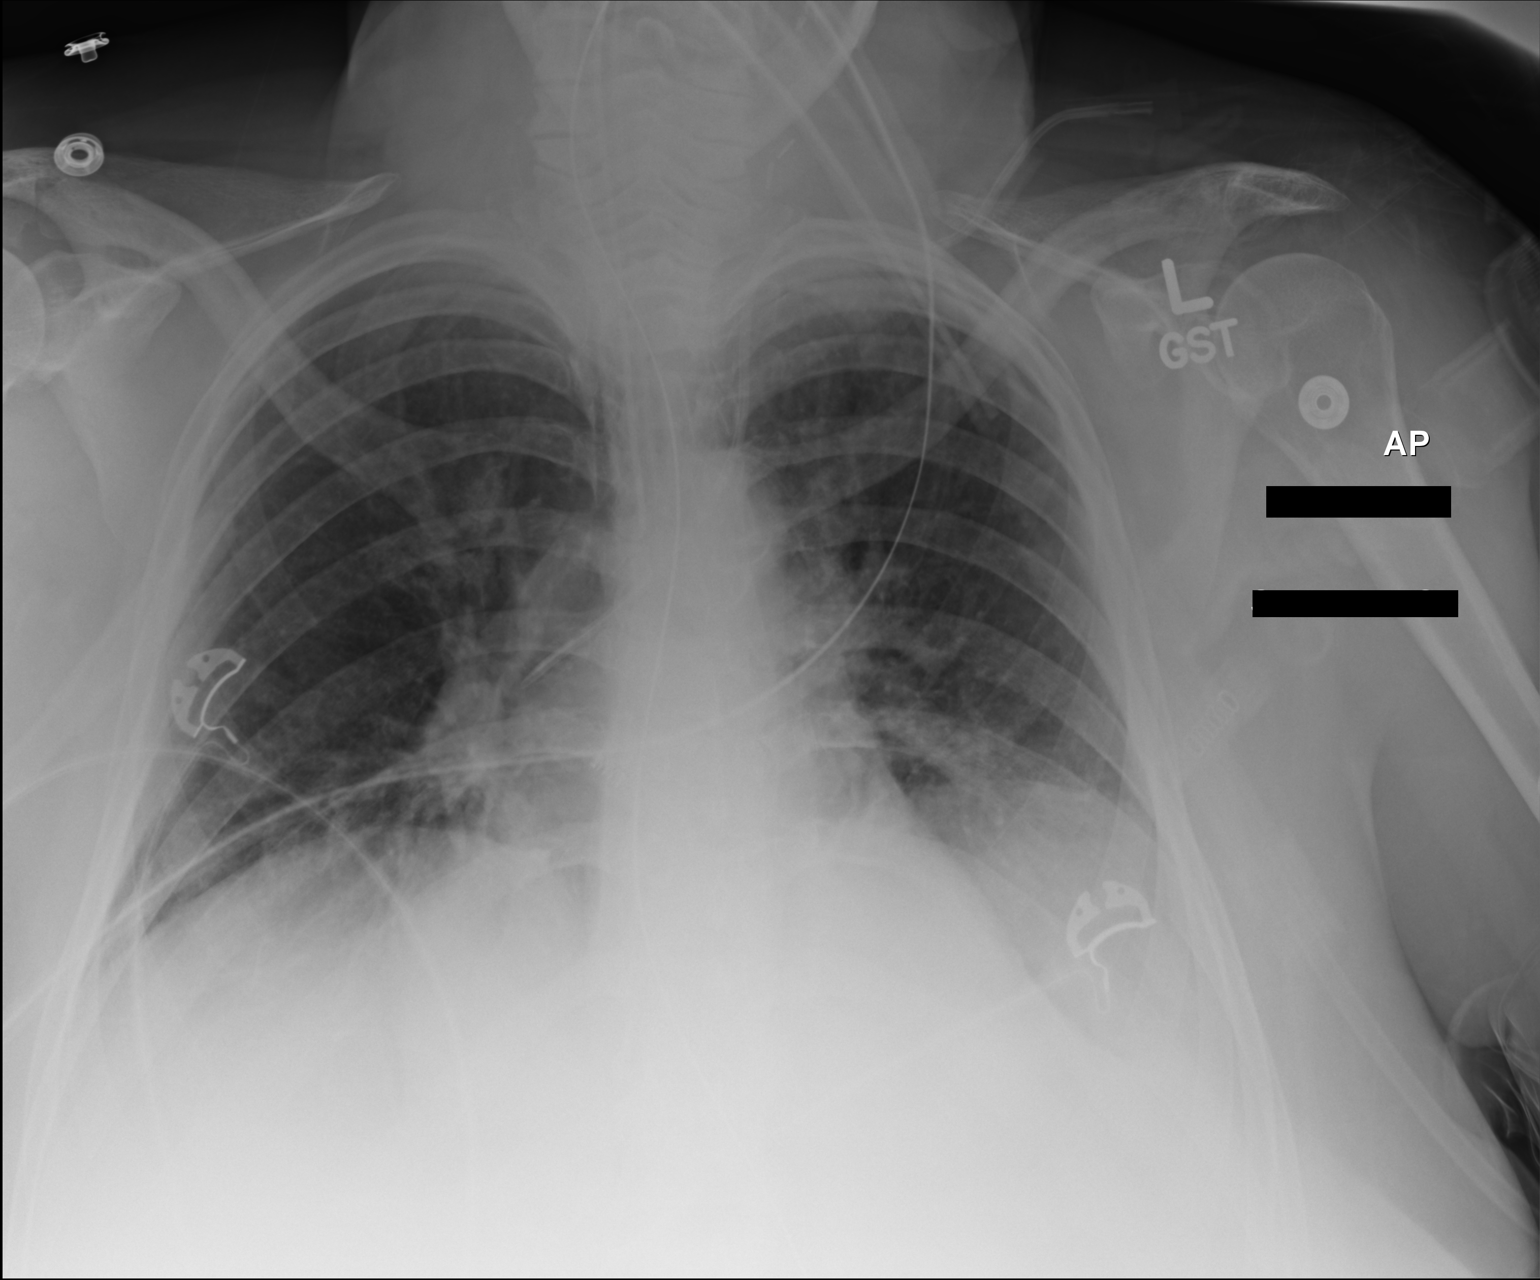

[1 of 1 positions shown; findings below may reference images not displayed]

FINDINGS: Lines and tubes in stable position. Patch that endotracheal tube, NG
tube, left IJ line in stable position. Heart size normal. Bibasilar
atelectasis and/or infiltrates. Slight interim progression. No
pleural effusion or pneumothorax.
IMPRESSION: 1. Lines and tubes in stable position.
2. Low lung volumes with bibasilar atelectasis and/or infiltrates,
progressed slightly from prior exam.

## 2016-08-11 ENCOUNTER — Emergency Department (HOSPITAL_COMMUNITY)
Admission: EM | Admit: 2016-08-11 | Discharge: 2016-08-11 | Disposition: A | Discharge status: Home / Self Care | Payer: Medicare Other | Attending: Emergency Medicine | Admitting: Emergency Medicine

## 2016-08-11 ENCOUNTER — Emergency Department (HOSPITAL_COMMUNITY): Payer: Medicare Other

## 2016-08-11 DIAGNOSIS — E039 Hypothyroidism, unspecified: Secondary | ICD-10-CM | POA: Insufficient documentation

## 2016-08-11 DIAGNOSIS — S0990XA Unspecified injury of head, initial encounter: Secondary | ICD-10-CM

## 2016-08-11 DIAGNOSIS — S0511XA Contusion of eyeball and orbital tissues, right eye, initial encounter: Secondary | ICD-10-CM | POA: Insufficient documentation

## 2016-08-11 DIAGNOSIS — M1610 Unilateral primary osteoarthritis, unspecified hip: Secondary | ICD-10-CM

## 2016-08-11 DIAGNOSIS — M179 Osteoarthritis of knee, unspecified: Secondary | ICD-10-CM | POA: Diagnosis not present

## 2016-08-11 DIAGNOSIS — Z87891 Personal history of nicotine dependence: Secondary | ICD-10-CM | POA: Diagnosis not present

## 2016-08-11 DIAGNOSIS — Z7901 Long term (current) use of anticoagulants: Secondary | ICD-10-CM

## 2016-08-11 DIAGNOSIS — I2510 Atherosclerotic heart disease of native coronary artery without angina pectoris: Secondary | ICD-10-CM | POA: Diagnosis not present

## 2016-08-11 DIAGNOSIS — M25551 Pain in right hip: Secondary | ICD-10-CM

## 2016-08-11 DIAGNOSIS — Y999 Unspecified external cause status: Secondary | ICD-10-CM | POA: Diagnosis not present

## 2016-08-11 DIAGNOSIS — D68318 Other hemorrhagic disorder due to intrinsic circulating anticoagulants, antibodies, or inhibitors: Secondary | ICD-10-CM | POA: Diagnosis not present

## 2016-08-11 DIAGNOSIS — J449 Chronic obstructive pulmonary disease, unspecified: Secondary | ICD-10-CM | POA: Diagnosis not present

## 2016-08-11 DIAGNOSIS — Z7982 Long term (current) use of aspirin: Secondary | ICD-10-CM | POA: Diagnosis not present

## 2016-08-11 DIAGNOSIS — S060X0A Concussion without loss of consciousness, initial encounter: Secondary | ICD-10-CM | POA: Diagnosis not present

## 2016-08-11 DIAGNOSIS — Y9301 Activity, walking, marching and hiking: Secondary | ICD-10-CM | POA: Diagnosis not present

## 2016-08-11 DIAGNOSIS — Z794 Long term (current) use of insulin: Secondary | ICD-10-CM | POA: Insufficient documentation

## 2016-08-11 DIAGNOSIS — S0083XA Contusion of other part of head, initial encounter: Secondary | ICD-10-CM

## 2016-08-11 DIAGNOSIS — Z955 Presence of coronary angioplasty implant and graft: Secondary | ICD-10-CM | POA: Diagnosis not present

## 2016-08-11 DIAGNOSIS — Z79899 Other long term (current) drug therapy: Secondary | ICD-10-CM | POA: Diagnosis not present

## 2016-08-11 DIAGNOSIS — Y929 Unspecified place or not applicable: Secondary | ICD-10-CM | POA: Insufficient documentation

## 2016-08-11 DIAGNOSIS — M169 Osteoarthritis of hip, unspecified: Secondary | ICD-10-CM | POA: Diagnosis not present

## 2016-08-11 DIAGNOSIS — M25561 Pain in right knee: Secondary | ICD-10-CM

## 2016-08-11 DIAGNOSIS — E1140 Type 2 diabetes mellitus with diabetic neuropathy, unspecified: Secondary | ICD-10-CM | POA: Insufficient documentation

## 2016-08-11 DIAGNOSIS — I5042 Chronic combined systolic (congestive) and diastolic (congestive) heart failure: Secondary | ICD-10-CM | POA: Insufficient documentation

## 2016-08-11 DIAGNOSIS — W01190A Fall on same level from slipping, tripping and stumbling with subsequent striking against furniture, initial encounter: Secondary | ICD-10-CM | POA: Insufficient documentation

## 2016-08-11 DIAGNOSIS — I110 Hypertensive heart disease with heart failure: Secondary | ICD-10-CM | POA: Diagnosis not present

## 2016-08-11 DIAGNOSIS — S7001XA Contusion of right hip, initial encounter: Secondary | ICD-10-CM | POA: Diagnosis not present

## 2016-08-11 DIAGNOSIS — W19XXXA Unspecified fall, initial encounter: Secondary | ICD-10-CM

## 2016-08-11 DIAGNOSIS — M1710 Unilateral primary osteoarthritis, unspecified knee: Secondary | ICD-10-CM

## 2016-08-11 LAB — CBG MONITORING, ED: Glucose-Capillary: 301 mg/dL — ABNORMAL HIGH (ref 65–99)

## 2016-08-11 IMAGING — CR DG CHEST 1V PORT
1 series · 1 of 1 positions shown · non-contrast
Comparison: 11/26/2015

CLINICAL DATA: Acute respiratory failure with hypoxemia.

EXAM:
PORTABLE CHEST 1 VIEW

[AP]
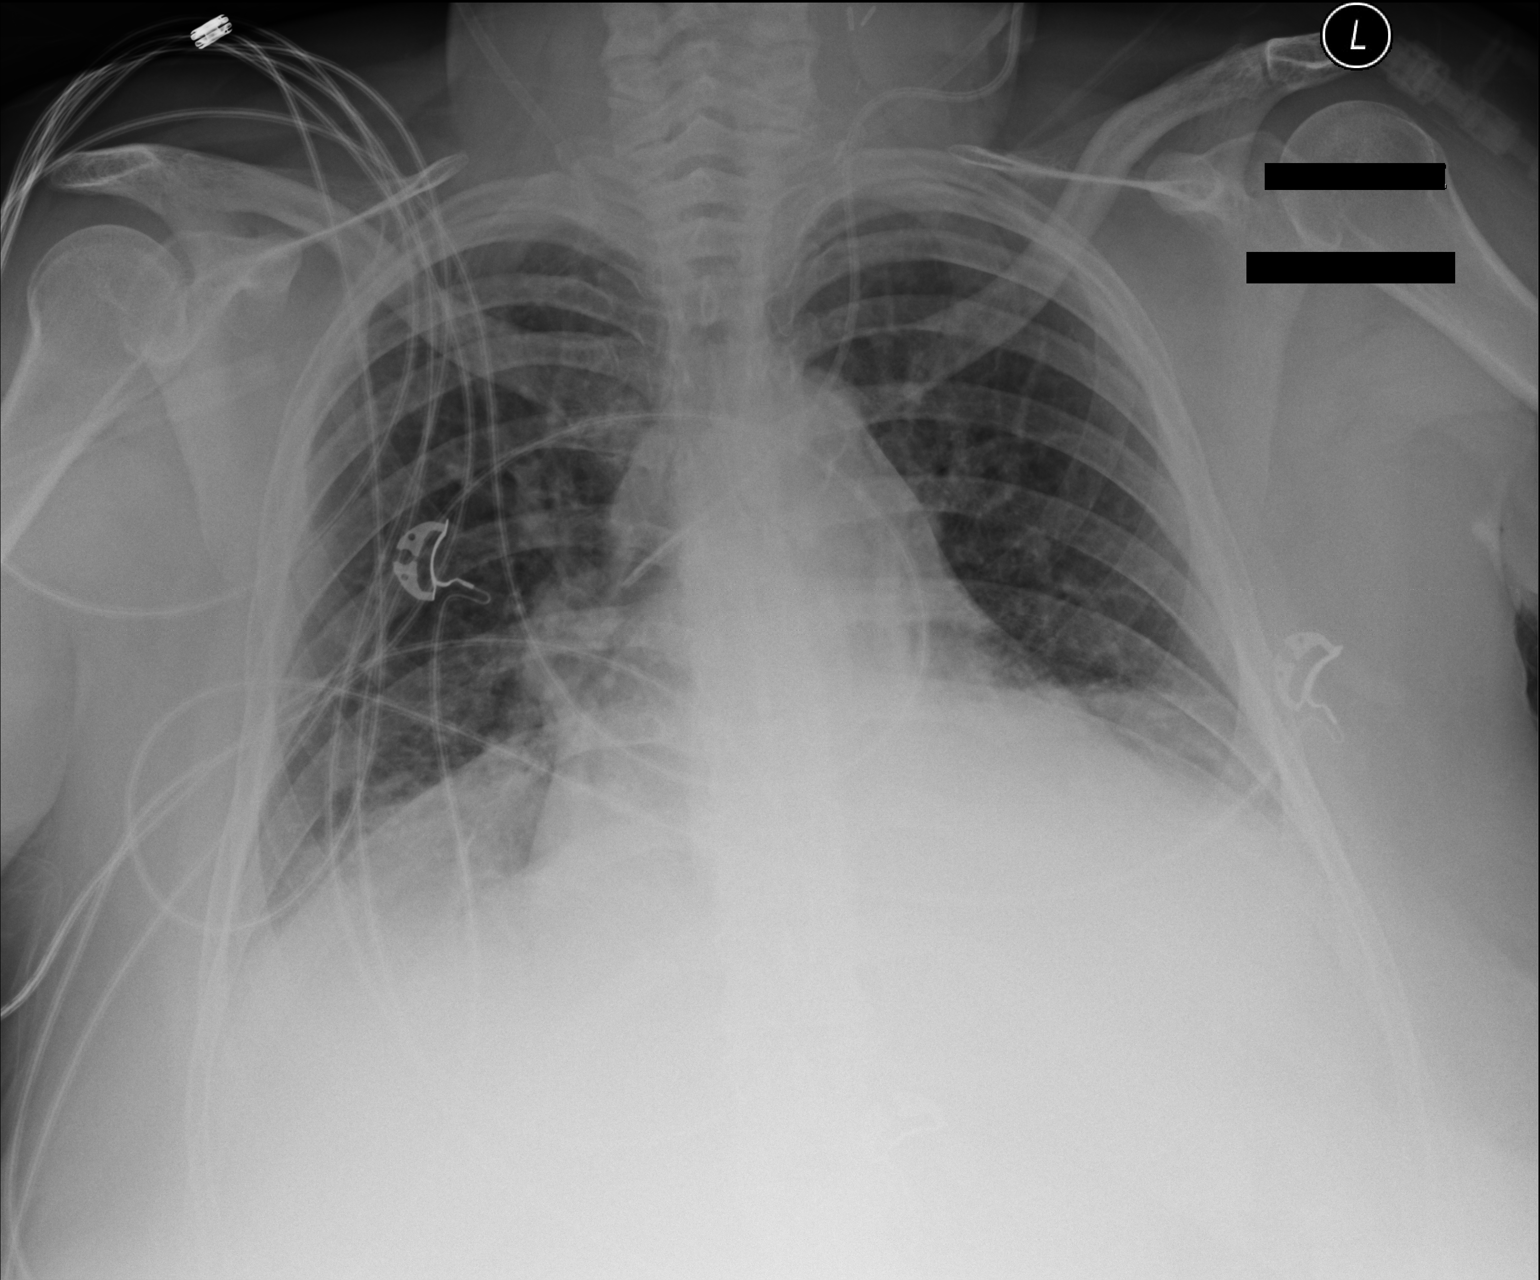

[1 of 1 positions shown; findings below may reference images not displayed]

FINDINGS: The endotracheal tube and NG tubes have been removed. The left IJ
catheter is stable. Low lung volumes with vascular crowding and
bibasilar atelectasis. No pulmonary edema.
IMPRESSION: Interval removal of ET and NG tubes.

Low lung volumes with vascular crowding and bibasilar atelectasis.
Suspect small bilateral pleural effusions.

## 2016-08-11 MED ORDER — ACETAMINOPHEN-CODEINE #3 300-30 MG PO TABS
1.0000 | ORAL_TABLET | Freq: Once | ORAL | Status: AC
  Administered 2016-08-11: 1 via ORAL
  Filled 2016-08-11: qty 1

## 2016-08-11 NOTE — ED Triage Notes (Addendum)
Pt states she tripped and fell walking out of trailer on Monday. Pt reports R knee and hip pain. Pt states she struck head on ground, bruising noted to R eye. Denies LOC. Currently taking coumadin at home.

## 2016-08-11 NOTE — ED Provider Notes (Signed)
[name] Provider Note   CSN: [id number] Arrival date & time: 08/11/16  1552     History   Chief Complaint Chief Complaint  Patient presents with  . Fall    HPI [name] is a 55 y.o. female with a PMHx of alcohol abuse, cirrhosis, PAF on coumadin, anemia, CAD, cardiomyopathy, CHF, GERD, HTN, HLD, PAD, peripheral neuropathy, DM2, and multiple other medical conditions listed below, who presents to the ED with complaints of mechanical fall on Monday 3 days ago when she was stepping out of her door and tripped on the door frame and fell onto her porch on her right side. Patient denies any LOC. She is on Coumadin. She is complaining of 10/10 constant stabbing diffuse right leg pain from her hip to her knee, nonradiating from that area, worse with sitting or standing, and unrelieved with Tylenol. Additionally she reports a mild headache, and a bruise around her right eye. She usually takes Tylenol #3 with codeine for pain but she has run out. She has a PCP appointment on 08/15/16 for refills and INR check.  She denies any recent fevers, chills, chest pain, shortness breath, abdominal pain, nausea, vomiting, diarrhea, constipation, dysuria, hematuria, new numbness or tingling, focal weakness, vision changes, lightheadedness, neck or back pain, or wounds. She denies any other injuries or complaints at this time.   The history is provided by the patient and medical records. No language interpreter was used.  Fall  This is a new problem. The current episode started more than 2 days ago. The problem occurs rarely. The problem has not changed since onset.Associated symptoms include headaches. Pertinent negatives include no chest pain, no abdominal pain and no shortness of breath. The symptoms are aggravated by walking. Nothing relieves the symptoms. She has tried acetaminophen for the symptoms. The treatment provided no relief.    Past Medical History:  Diagnosis Date  . Alcohol abuse    . Alcoholic cirrhosis ([place])   . Alcoholic cirrhosis ([place])   . Anemia   . Anxiety   . B12 deficiency   . CAD (coronary artery disease)    a. cath 11/10/2014 LM nl, LAD min irregs, D1 30 ost, D2 50d, LCX 98m OM1 80 p/m (1.5 mm vessel), OM2 467mRCA nondom 9022mmed rx.. Demand ischemia in the setting of rapid a-fib.  . Cardiomyopathy (HCC[place] . Carotid artery disease ([place]CC[place]Clen[place]  a. 01/2015 Carotid Angio: RICA [id number]/p L carotid endarterectomy 02/2015.  . CMarland Kitchenronic combined systolic and diastolic CHF (congestive heart failure) (HCCDrexel [place]  a. Last echo 12/205: EF 40-45%, inferoapical/posterior HK, not technically sufficient to allow eval of LV diastolic function, normal LA in size..  . Cocaine abuse   . COPD (chronic obstructive pulmonary disease) (HCCParma . Critical lower limb ischemia   . Diabetic peripheral neuropathy (HCCCleveland . Elevated troponin    a. Chronic elevation.  . GMarland KitchenRD (gastroesophageal reflux disease)   . Hyperlipemia   . Hypertension   . Hypokalemia   . Hypomagnesemia   . Hypothyroidism   . Marijuana abuse   . Narcotic abuse   . Noncompliance   . NSVT (nonsustained ventricular tachycardia) (HCC[place] . Obesity   . PAF (paroxysmal atrial fibrillation) (HCC[place]land[place] . Paroxysmal atrial tachycardia (HCC[place] . Paroxysmal atrial tachycardia (HCC[place] . Peripheral arterial disease (HCCK[place]  a. 01/2015 Angio/PTA: LSFA 100 w/ recon @ adductor canal and 1 vessel runoff  via AT, RSFA 99 (atherectomy/pta) - 1 vessel runoff via diff dzs peroneal.  . Poorly controlled type 2 diabetes mellitus ([place])   . Renal insufficiency    a. 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  [name] Symptomatic bradycardia    avoid AV blocking agent per EP  . Tobacco abuse     Patient Active Problem List   Diagnosis Date Noted  . Severe obesity (BMI >= 40) ([place]) 02/24/2016  . COPD GOLD 0  02/24/2016  . Chronic systolic CHF (congestive heart failure) ([place]) 02/12/2016  . Encounter for therapeutic drug monitoring 02/10/2016  .  Symptomatic bradycardia 01/12/2016  . Essential hypertension 12/22/2015  . Wheeze   . 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 Tobacco abuse 10/23/2015  . Coronary artery disease involving native coronary artery of native heart   . DOE (dyspnea on exertion) 04/29/2015  . PAF (paroxysmal atrial fibrillation) ([place]) 01/16/2015  . Carotid arterial disease ([place]) 01/16/2015  . Claudication ([place]) 01/15/2015  . Demand ischemia ([place]) 10/29/2014  . Insomnia 02/03/2014  . S/P peripheral artery angioplasty - [place] atherectomy; R SFA 09/11/2013    Class: Acute  . Leg pain, bilateral 08/19/2013  . Hypothyroidism 07/31/2013  . Cellulitis 06/13/2013  . History of cocaine abuse 06/13/2013  . Long term current use of anticoagulant therapy 05/20/2013  . Alcohol abuse   . Narcotic abuse   . Marijuana abuse   . Alcoholic cirrhosis ([place])   . DM (diabetes mellitus), type 2 with peripheral vascular complications [facility])     Past Surgical History:  Procedure Laterality Date  . BALLOON ANGIOPLASTY, ARTERY Right 01/15/2015   SFA/notes 01/15/2015  . CARDIAC CATHETERIZATION    . CARDIAC CATHETERIZATION N/A 01/12/2016   Procedure: Temporary Wire;  Surgeon: Minus Breeding, MD;  Location: [facility];  Service: Cardiovascular;  Laterality: N/A;  . CARDIOVERSION  ~ 02/2013   "twice"   . CAROTID ANGIOGRAM N/A 01/15/2015   Procedure: CAROTID ANGIOGRAM;  Surgeon: [name], MD;  Location: [facility] CATH LAB;  Service: Cardiovascular;  Laterality: N/A;  . DILATION AND CURETTAGE OF UTERUS  1988  . ENDARTERECTOMY Left 02/19/2015   Procedure: LEFT CAROTID ENDARTERECTOMY ;  Surgeon: Serafina Mitchell, MD;  Location: [facility];  Service: Vascular;  Laterality: Left;  . LEFT HEART CATHETERIZATION WITH CORONARY ANGIOGRAM N/A 10/31/2014   Procedure: LEFT HEART CATHETERIZATION WITH CORONARY ANGIOGRAM;  Surgeon: Burnell Blanks, MD;  Location[facility] CATH LAB;  Service: Cardiovascular;  Laterality: N/A;  . LOWER EXTREMITY  ANGIOGRAM N/A 09/10/2013   Procedure: LOWER EXTREMITY ANGIOGRAM;  Surgeon: [name], MD;  Location: [facility] CATH LAB;  Service: Cardiovascular;  Laterality: N/A;  . LOWER EXTREMITY ANGIOGRAM N/A 01/15/2015   Procedure: LOWER EXTREMITY ANGIOGRAM;  Surgeon: [name], MD;  Location: [facility] CATH LAB;  Service: Cardiovascular;  Laterality: N/A;  . PERIPHERAL ATHRECTOMY Right 01/15/2015   SFA/notes 01/15/2015    OB History    Gravida Para Term Preterm AB Living   '1 1 1     1   ' SAB TAB Ectopic Multiple Live Births                   Home Medications    Prior to Admission medications   Medication Sig Start Date End Date Taking? Authorizing Provider  acetaminophen-codeine (TYLENOL #3) 300-30 MG tablet Take 1 tablet by mouth 2 (two) times daily as needed for moderate pain.    Historical Provider, MD  albuterol (PROVENTIL HFA;VENTOLIN HFA) 108 (90 Base) MCG/ACT  inhaler Inhale 2 puffs into the lungs every 6 (six) hours as needed for wheezing or shortness of breath. 03/01/16   Arnoldo Morale, MD  albuterol (PROVENTIL) (2.5 MG/3ML) 0.083% nebulizer solution Take 3 mLs (2.5 mg total) by nebulization every 6 (six) hours as needed. For wheezing. 06/30/15   Arnoldo Morale, MD  amiodarone (PACERONE) 100 MG tablet Take 100 mg by mouth daily.    Historical Provider, MD  amLODipine (NORVASC) 10 MG tablet Take 1 tablet (10 mg total) by mouth every evening. 05/11/16   Shirley Friar, PA-C  atorvastatin (LIPITOR) 40 MG tablet Take 1 tablet (40 mg total) by mouth every evening. 07/07/16   Shirley Friar, PA-C  bisoprolol (ZEBETA) 5 MG tablet Take 0.5 tablets (2.5 mg total) by mouth every evening. 05/11/16   Shirley Friar, PA-C  Blood Glucose Monitoring Suppl (ACCU-CHEK AVIVA PLUS) W/DEVICE KIT 1 Device by Does not apply route 4 (four) times daily -  before meals and at bedtime. 07/21/15   Arnoldo Morale, MD  budesonide-formoterol (SYMBICORT) 160-4.5 MCG/ACT inhaler Inhale 2 puffs into the lungs 2  (two) times daily. 12/03/15   Shirley Friar, PA-C  cholecalciferol (VITAMIN D) 1000 units tablet Take 1,000 Units by mouth daily.    Historical Provider, MD  cyanocobalamin 1000 MCG tablet Take 1 tablet (1,000 mcg total) by mouth daily. 05/11/16   Brittainy Erie Noe, PA-C  DULoxetine (CYMBALTA) 30 MG capsule Take 30 mg by mouth daily.    Historical Provider, MD  folic acid (FOLVITE) 1 MG tablet TAKE ONE TABLET BY MOUTH DAILY. 06/10/16   Brittainy Erie Noe, PA-C  gabapentin (NEURONTIN) 300 MG capsule Take 300 mg by mouth 4 (four) times daily.    Historical Provider, MD  insulin aspart (NOVOLOG FLEXPEN) 100 UNIT/ML FlexPen Inject 24-44 Units into the skin 3 (three) times daily with meals.     Historical Provider, MD  Insulin Glargine (LANTUS SOLOSTAR) 100 UNIT/ML Solostar Pen Inject 64 Units into the skin daily at 10 pm.     Historical Provider, MD  Insulin Pen Needle (ULTICARE MICRO PEN NEEDLES) 32G X 4 MM MISC 1 Syringe by Does not apply route 4 (four) times daily - after meals and at bedtime. 06/30/15   Arnoldo Morale, MD  Lancets (ACCU-CHEK SOFT TOUCH) lancets Use as instructed 07/21/15   Arnoldo Morale, MD  levothyroxine (SYNTHROID, LEVOTHROID) 50 MCG tablet Take 1 tablet (50 mcg total) by mouth daily. 06/30/15   Arnoldo Morale, MD  losartan (COZAAR) 25 MG tablet Take 1 tablet (25 mg total) by mouth daily. 07/07/16 10/05/16  Shirley Friar, PA-C  metolazone (ZAROXOLYN) 2.5 MG tablet Take 1 tablet (2.5 mg total) by mouth once a week. On Thursday. May take an additional 2.5 mg (1 tablet) as directed by HF clinic. 07/07/16   Jolaine Artist, MD  nitroGLYCERIN (NITROSTAT) 0.4 MG SL tablet Place 1 tablet (0.4 mg total) under the tongue every 5 (five) minutes as needed for chest pain. Patient not taking: Reported on 07/07/2016 01/16/15   Rogelia Mire, NP  potassium chloride SA (K-DUR,KLOR-CON) 20 MEQ tablet Take 30 mEq by mouth daily. Take an additional 30 meq (1 and 1/2 tablets) on Thursday  with metolazone    Historical Provider, MD  potassium chloride SA (K-DUR,KLOR-CON) 20 MEQ tablet TAKE 11/2 TABLETS BY MOUTH EVERY DAY AND TAKE AN EXTRA 11/2 TABLETS AS DIRECTED 07/08/16   [name] D Clegg, NP  torsemide (DEMADEX) 20 MG tablet TAKE 4 TABLETS BY MOUTH EVERY  DAY AND TAKE AN EXTRA 4 TABLETS AS DIRECTED 07/08/16   [name] D Ninfa Meeker, NP  warfarin (COUMADIN) 5 MG tablet TAKE 1 TABLET TO 1 AND 1/2 TABLETS BY MOUTH EVERY DAY AS DIRECTED BY COUMADIN CLINIC 06/10/16   [name], MD    Family History Family History  Problem Relation Age of Onset  . Hypertension Mother   . Diabetes Mother   . Cancer Mother     breast, ovarian, colon  . Clotting disorder Mother   . Heart disease Mother   . Heart attack Mother   . Hypertension Father   . Heart disease Father   . Emphysema Sister     smoked    Social History Social History  Substance Use Topics  . Smoking status: Former Smoker    Packs/day: 0.00    Years: 40.00    Types: E-cigarettes    Quit date: 11/03/2015  . Smokeless tobacco: Former Systems developer  . Alcohol use No     Allergies   Review of patient's allergies indicates no known allergies.   Review of Systems Review of Systems  Constitutional: Negative for chills and fever.  Eyes: Negative for visual disturbance.  Respiratory: Negative for shortness of breath.   Cardiovascular: Negative for chest pain.  Gastrointestinal: Negative for abdominal pain, constipation, diarrhea, nausea and vomiting.  Genitourinary: Negative for dysuria and hematuria.  Musculoskeletal: Positive for arthralgias (R hip and R knee). Negative for back pain, myalgias and neck pain.  Skin: Positive for color change (bruising). Negative for wound.  Allergic/Immunologic: Positive for immunocompromised state (diabetic).  Neurological: Positive for headaches. Negative for syncope, weakness, light-headedness and numbness.  Hematological: Bruises/bleeds easily (on coumadin).  Psychiatric/Behavioral: Negative for  confusion.   10 Systems reviewed and are negative for acute change except as noted in the HPI.   Physical Exam Updated Vital Signs BP 156/84   Pulse 91   Temp 98.7 F (37.1 C) (Oral)   Resp 16   Ht '5\' 4"'  (1.626 m)   Wt 117 kg   LMP 11/27/2012   SpO2 98%   BMI 44.29 kg/m   Physical Exam  Constitutional: She is oriented to person, place, and time. Vital signs are normal. She appears well-developed and well-nourished.  Non-toxic appearance. No distress.  Afebrile, nontoxic, NAD  HENT:  Head: Normocephalic. Head is with contusion. Head is without Battle's sign, without abrasion and without laceration.  Mouth/Throat: Oropharynx is clear and moist and mucous membranes are normal.  R periorbital hematoma, no battle's sign, no lacerations, no scalp crepitus or deformity  Eyes: Conjunctivae and EOM are normal. Pupils are equal, round, and reactive to light. Right eye exhibits no discharge. Left eye exhibits no discharge.  PERRL, EOMI, no nystagmus, no visual field deficits   Neck: Normal range of motion. Neck supple. No spinous process tenderness and no muscular tenderness present. No neck rigidity. Normal range of motion present.  FROM intact without spinous process TTP, no bony stepoffs or deformities, no paraspinous muscle TTP or muscle spasms. No rigidity or meningeal signs. No bruising or swelling.   Cardiovascular: Normal rate, regular rhythm, normal heart sounds and intact distal pulses.  Exam reveals no gallop and no friction rub.   No murmur heard. Pulmonary/Chest: Effort normal and breath sounds normal. No respiratory distress. She has no decreased breath sounds. She has no wheezes. She has no rhonchi. She has no rales.  Abdominal: Soft. Normal appearance and bowel sounds are normal. She exhibits no distension. There is no tenderness.  There is no rigidity, no rebound, no guarding, no CVA tenderness, no tenderness at McBurney's point and negative Murphy's sign.  Musculoskeletal:  Normal range of motion.       Right hip: She exhibits tenderness and bony tenderness. She exhibits normal range of motion, normal strength, no swelling, no crepitus, no deformity and no laceration.       Right knee: She exhibits normal range of motion, no swelling, no ecchymosis, no deformity, no laceration, no erythema, normal alignment, no LCL laxity, normal patellar mobility and no MCL laxity. Tenderness found. Medial joint line and lateral joint line tenderness noted.       Legs: R hip with FROM intact, with mild diffuse joint line TTP, small bruise to greater trochanteric region, no swelling, no crepitus or deformity, no limb length discrepancy or abnormal rotation. No pain with log roll testing.  R knee with FROM intact, with mild diffuse medial and lateral joint line TTP, no bony TTP to thigh/calf, no swelling/deformity, no bruising, no abnormal alignment or patellar mobility, no varus/valgus laxity, neg anterior drawer test, no crepitus.  MAE x4 Strength and sensation grossly intact Distal pulses intact in upper extremities, but difficult to palpate DP/PT pulses which pt states is chronic and unchanged Chronic RLE wound, unchanged from baseline. Chronic BLE erythema around ankles, unchanged from baseline  Neurological: She is alert and oriented to person, place, and time. She has normal strength. No cranial nerve deficit or sensory deficit. Coordination and gait normal. GCS eye subscore is 4. GCS verbal subscore is 5. GCS motor subscore is 6.  CN 2-12 grossly intact A&O x4 GCS 15 Sensation and strength intact Gait nonataxic Coordination with finger-to-nose WNL Neg pronator drift   Skin: Skin is warm, dry and intact. Bruising noted. No rash noted.  Bruise to R hip and R periorbital region as mentioned above  Psychiatric: She has a normal mood and affect.  Nursing note and vitals reviewed.    ED Treatments / Results  Labs (all labs ordered are listed, but only abnormal results are  displayed) Labs Reviewed  CBG MONITORING, ED - Abnormal; Notable for the following:       Result Value   Glucose-Capillary 301 (*)    All other components within normal limits    EKG  EKG Interpretation None       Radiology Ct Head Wo Contrast  Result Date: 08/11/2016 CLINICAL DATA:  [place] out of trailer on Monday hitting posterior occiput with headache and lightheadedness, the patient is on blood thinner EXAM: CT HEAD WITHOUT CONTRAST TECHNIQUE: Contiguous axial images were obtained from the base of the skull through the vertex without intravenous contrast. COMPARISON:  CT brain scan of 06/23/2016 FINDINGS: Brain: The ventricular system is minimally prominent as are the cortical sulci, indicative of mild atrophy. The septum remains midline in position. The fourth ventricle and basilar cisterns are unremarkable. Mild small vessel ischemic change throughout the periventricular white matter is stable. No hemorrhage, mass lesion, or acute infarction is seen. Vascular: No vascular abnormality is seen on this unenhanced study. Skull: No acute calvarial abnormality is noted. Sinuses/Orbits: There is fluid within the left partition of the sphenoid sinus consistent with sinusitis, possibly chronic. The remainder of the paranasal sinuses are well pneumatized. Other: There does appear to be a right frontal scalp hematoma above the right orbit. IMPRESSION: 1. Mild atrophy and small vessel disease. No acute intracranial abnormality. 2. Probable scalp hematoma in the high right frontal region. 3. Chronic left partition sphenoid  sinusitis. Electronically Signed   By: Ivar Drape M.D.   On: 08/11/2016 16:50   Dg Knee Complete 4 Views Right  Result Date: 08/11/2016 CLINICAL DATA:  Right hip pain after fall. EXAM: RIGHT KNEE - COMPLETE 4+ VIEW COMPARISON:  None. FINDINGS: No evidence of fracture, dislocation, or joint effusion. There are 3 compartment osteoarthritic changes with joint space narrowing,  subchondral sclerosis, osteophyte formation. Meniscal calcifications also seen. Soft tissues are unremarkable. IMPRESSION: No acute fracture or dislocation identified about the right Knee. Three compartment osteoarthritic changes, moderate to severe. Electronically Signed   By: Fidela Salisbury M.D.   On: 08/11/2016 22:02   Dg Hip Unilat W Or Wo Pelvis 2-3 Views Right  Result Date: 08/11/2016 CLINICAL DATA:  55 year old female with fall and right hip pain. 55 year old female with fall and right hip pain. EXAM: DG HIP (WITH OR WITHOUT PELVIS) 2-3V RIGHT COMPARISON:  None. FINDINGS: S there is no acute fracture or dislocation. The bones are osteopenic. There is mild bilateral hip osteoarthritic changes. The soft tissues are grossly unremarkable. No radiopaque foreign object. IMPRESSION: No acute fracture or dislocation. Electronically Signed   By: Anner Crete M.D.   On: 08/11/2016 22:00    Procedures Procedures (including critical care time)  Medications Ordered in ED Medications  acetaminophen-codeine (TYLENOL #3) 300-30 MG per tablet 1 tablet (1 tablet Oral Given 08/11/16 2048)     Initial Impression / Assessment and Plan / ED Course  I have reviewed the triage vital signs and the nursing notes.  Pertinent labs & imaging results that were available during my care of the patient were reviewed by me and considered in my medical decision making (see chart for details).  Clinical Course    55 y.o. female here with mechanical fall 3 days ago c/o R leg pain from hip into knee, R eye bruise/hematoma, and HA. On Coumadin. No LOC. No focal neuro deficits, bruise around R eye, PERRL and EOMI, strength and sensation grossly intact in all extremities, pulses difficult to find in B/L feet but this is chronic and at baseline per pt, mild tenderness to R knee and hip, small bruise to R hip. CT head done in triage, and was neg. Will obtain R hip and R knee xray. Will give tylenol #3 which is her home  pain med, and reassess shortly. Doubt need for labs.   11:01 PM R hip and knee xray show arthritis in both hips and in knee, but no acute findings seen. Discussed concussion guidelines for head trauma, use of ice/tylenol/motrin for pain, and f/up with PCP at her already scheduled visit in 4 days for recheck of symptoms, and with ortho in 1-2wks for ongoing management of her arthritis. Doesn't need for crutches since she uses a wheelchair typically. I explained the diagnosis and have given explicit precautions to return to the ER including for any other new or worsening symptoms. The patient understands and accepts the medical plan as it's been dictated and I have answered their questions. Discharge instructions concerning home care and prescriptions have been given. The patient is STABLE and is discharged to home in good condition.   Final Clinical Impressions(s) / ED Diagnoses   Final diagnoses:  Fall, initial encounter  Facial contusion, initial encounter  Minor head injury, initial encounter  Anticoagulated on Coumadin  Concussion, without loss of consciousness, initial encounter  Right hip pain  Right knee pain  Hip arthritis  Arthritis of knee    New Prescriptions New Prescriptions  No medications on file     Zacarias Pontes, PA-C 08/11/16 2307    Forde Dandy, MD 08/13/16 1135

## 2016-08-11 NOTE — Discharge Instructions (Signed)
Use Tylenol or ibuprofen as needed for pain. Get plenty of rest, use ice on your head.  Stay in a quiet, not simulating, dark environment. No TV, computer use, video games, or cell phone use until headache is resolved completely. Your xrays show that you have arthritis in your hips and knee, ice and elevate the areas to help with pain, and use tylenol/ibuprofen for pain. Follow Up with  your primary care physician in 4 days for your already scheduled appointment, and to follow up after today's visit. Follow up with the orthopedist in 1-2 weeks for ongoing management of your arthritis.  Return to the emergency department if patient becomes lethargic, begins vomiting or other change in mental status, or for any changes or worsening symptoms.

## 2016-08-15 ENCOUNTER — Ambulatory Visit (INDEPENDENT_AMBULATORY_CARE_PROVIDER_SITE_OTHER): Payer: Medicare Other | Admitting: Pharmacist Clinician (PhC)/ Clinical Pharmacy Specialist

## 2016-08-15 ENCOUNTER — Telehealth: Payer: Self-pay | Admitting: Cardiovascular Disease

## 2016-08-15 DIAGNOSIS — I480 Paroxysmal atrial fibrillation: Secondary | ICD-10-CM

## 2016-08-15 DIAGNOSIS — I4891 Unspecified atrial fibrillation: Secondary | ICD-10-CM

## 2016-08-15 DIAGNOSIS — Z5181 Encounter for therapeutic drug level monitoring: Secondary | ICD-10-CM | POA: Diagnosis not present

## 2016-08-15 LAB — POCT INR: INR: 2.6

## 2016-08-16 NOTE — Telephone Encounter (Signed)
n

## 2016-08-22 ENCOUNTER — Telehealth (HOSPITAL_COMMUNITY): Payer: Self-pay | Admitting: Pharmacist

## 2016-08-22 DIAGNOSIS — I5043 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) and diastolic (congestive) heart failure: Secondary | ICD-10-CM

## 2016-08-22 DIAGNOSIS — I5022 Chronic systolic (congestive) heart failure: Secondary | ICD-10-CM

## 2016-08-22 MED ORDER — TORSEMIDE 20 MG PO TABS: 80.0000 mg | ORAL_TABLET | Freq: Every day | ORAL | 0 refills | Status: DC

## 2016-08-22 MED ORDER — AMLODIPINE BESYLATE 10 MG PO TABS: 10.0000 mg | ORAL_TABLET | Freq: Every evening | ORAL | 0 refills | Status: DC

## 2016-08-22 MED ORDER — METOLAZONE 2.5 MG PO TABS: 2.5000 mg | ORAL_TABLET | ORAL | 0 refills | Status: DC

## 2016-08-22 MED ORDER — ATORVASTATIN CALCIUM 40 MG PO TABS: 40.0000 mg | ORAL_TABLET | Freq: Every evening | ORAL | 0 refills | Status: DC

## 2016-08-22 MED ORDER — BISOPROLOL FUMARATE 5 MG PO TABS: 2.5000 mg | ORAL_TABLET | Freq: Every evening | ORAL | 0 refills | Status: DC

## 2016-08-22 MED ORDER — AMIODARONE HCL 100 MG PO TABS: 100.0000 mg | ORAL_TABLET | Freq: Every day | ORAL | 0 refills | Status: DC

## 2016-08-22 MED ORDER — WARFARIN SODIUM 5 MG PO TABS: 5.0000 mg | ORAL_TABLET | Freq: Every day | ORAL | 0 refills | Status: DC

## 2016-08-22 MED ORDER — LOSARTAN POTASSIUM 25 MG PO TABS: 25.0000 mg | ORAL_TABLET | Freq: Every day | ORAL | 0 refills | Status: DC

## 2016-08-22 MED ORDER — POTASSIUM CHLORIDE CRYS ER 20 MEQ PO TBCR: 30.0000 meq | EXTENDED_RELEASE_TABLET | Freq: Every day | ORAL | 0 refills | Status: DC

## 2016-08-22 MED FILL — AMLODIPINE BESYLATE 10 MG T: 10 | 21 days supply | Qty: 21 | Fill #0

## 2016-08-22 MED FILL — AMIODARONE HCL 200 MG TAB: 200 | 22 days supply | Qty: 11 | Fill #0

## 2016-08-22 MED FILL — metOLazone 2.5 MG TABS: 2.5 | 28 days supply | Qty: 4 | Fill #0

## 2016-08-22 MED FILL — ATORVASTATIN 40 MG TABLET: 40 | 21 days supply | Qty: 21 | Fill #0

## 2016-08-22 MED FILL — LOSARTAN POTASSIUM 25 MG TA: 25 | 21 days supply | Qty: 21 | Fill #0

## 2016-08-22 MED FILL — KLOR-CON M20 TABLET: 20 | 20 days supply | Qty: 36 | Fill #0

## 2016-08-22 MED FILL — BISOPROLOL FUMARATE 5 MG TA: 5 | 22 days supply | Qty: 11 | Fill #0

## 2016-08-22 MED FILL — TORSEMIDE 20 MG TABLET: 20 | 21 days supply | Qty: 84 | Fill #0

## 2016-08-22 NOTE — Telephone Encounter (Signed)
[name]) called stating that Ms. Hatchell misplaced all of her medications that she just filled at the end of September. Will give her a few weeks worth to get her through to the end of the month.   [name]. [name], PharmD, BCPS, CPP Clinical Pharmacist Pager: [phone] Phone: [phone] 08/22/2016 3:50 PM

## 2016-08-25 ENCOUNTER — Telehealth: Payer: Self-pay | Admitting: Pharmacist

## 2016-08-25 NOTE — Telephone Encounter (Signed)
Pt called to report that she lost her medications. She is unable to afford her medications to replace lost.   She called to inquire about samples. Unfortunately she takes mostly generic medications and we do not have samples of any on her list.   Have asked her to call about price on warfarin since she is out of warfarin. She states she will do this.   She will call if she is unable to obtain warfarin.

## 2016-08-31 ENCOUNTER — Ambulatory Visit (HOSPITAL_COMMUNITY)
Admission: RE | Admit: 2016-08-31 | Discharge: 2016-08-31 | Disposition: A | Discharge status: Home / Self Care | Payer: Medicare Other | Source: Ambulatory Visit | Attending: Internal Medicine | Admitting: Internal Medicine

## 2016-08-31 ENCOUNTER — Telehealth: Payer: Self-pay | Admitting: Pharmacist Clinician (PhC)/ Clinical Pharmacy Specialist

## 2016-08-31 VITALS — BP 122/60 | HR 63 | Wt 266.2 lb

## 2016-08-31 DIAGNOSIS — I5033 Acute on chronic diastolic (congestive) heart failure: Secondary | ICD-10-CM

## 2016-08-31 DIAGNOSIS — I5022 Chronic systolic (congestive) heart failure: Secondary | ICD-10-CM | POA: Insufficient documentation

## 2016-08-31 DIAGNOSIS — Z9981 Dependence on supplemental oxygen: Secondary | ICD-10-CM | POA: Diagnosis not present

## 2016-08-31 DIAGNOSIS — K7460 Unspecified cirrhosis of liver: Secondary | ICD-10-CM | POA: Diagnosis not present

## 2016-08-31 DIAGNOSIS — E1122 Type 2 diabetes mellitus with diabetic chronic kidney disease: Secondary | ICD-10-CM | POA: Insufficient documentation

## 2016-08-31 DIAGNOSIS — I2510 Atherosclerotic heart disease of native coronary artery without angina pectoris: Secondary | ICD-10-CM | POA: Diagnosis not present

## 2016-08-31 DIAGNOSIS — Z8249 Family history of ischemic heart disease and other diseases of the circulatory system: Secondary | ICD-10-CM | POA: Diagnosis not present

## 2016-08-31 DIAGNOSIS — I779 Disorder of arteries and arterioles, unspecified: Secondary | ICD-10-CM

## 2016-08-31 DIAGNOSIS — J449 Chronic obstructive pulmonary disease, unspecified: Secondary | ICD-10-CM | POA: Diagnosis not present

## 2016-08-31 DIAGNOSIS — Z87891 Personal history of nicotine dependence: Secondary | ICD-10-CM | POA: Diagnosis not present

## 2016-08-31 DIAGNOSIS — R001 Bradycardia, unspecified: Secondary | ICD-10-CM | POA: Diagnosis not present

## 2016-08-31 DIAGNOSIS — E1151 Type 2 diabetes mellitus with diabetic peripheral angiopathy without gangrene: Secondary | ICD-10-CM

## 2016-08-31 DIAGNOSIS — Z9119 Patient's noncompliance with other medical treatment and regimen: Secondary | ICD-10-CM | POA: Diagnosis not present

## 2016-08-31 DIAGNOSIS — I482 Chronic atrial fibrillation: Secondary | ICD-10-CM | POA: Insufficient documentation

## 2016-08-31 DIAGNOSIS — Z794 Long term (current) use of insulin: Secondary | ICD-10-CM | POA: Insufficient documentation

## 2016-08-31 DIAGNOSIS — Z79899 Other long term (current) drug therapy: Secondary | ICD-10-CM | POA: Diagnosis not present

## 2016-08-31 DIAGNOSIS — N183 Chronic kidney disease, stage 3 (moderate): Secondary | ICD-10-CM | POA: Insufficient documentation

## 2016-08-31 DIAGNOSIS — I130 Hypertensive heart and chronic kidney disease with heart failure and stage 1 through stage 4 chronic kidney disease, or unspecified chronic kidney disease: Secondary | ICD-10-CM | POA: Diagnosis not present

## 2016-08-31 DIAGNOSIS — I480 Paroxysmal atrial fibrillation: Secondary | ICD-10-CM | POA: Diagnosis not present

## 2016-08-31 DIAGNOSIS — I739 Peripheral vascular disease, unspecified: Secondary | ICD-10-CM | POA: Diagnosis not present

## 2016-08-31 DIAGNOSIS — Z7901 Long term (current) use of anticoagulants: Secondary | ICD-10-CM | POA: Insufficient documentation

## 2016-08-31 DIAGNOSIS — E039 Hypothyroidism, unspecified: Secondary | ICD-10-CM | POA: Insufficient documentation

## 2016-08-31 DIAGNOSIS — I10 Essential (primary) hypertension: Secondary | ICD-10-CM

## 2016-08-31 DIAGNOSIS — Z720 Tobacco use: Secondary | ICD-10-CM

## 2016-08-31 LAB — COMPREHENSIVE METABOLIC PANEL
ALBUMIN: 3.5 g/dL (ref 3.5–5.0)
ALT: 38 U/L (ref 14–54)
AST: 34 U/L (ref 15–41)
Alkaline Phosphatase: 187 U/L — ABNORMAL HIGH (ref 38–126)
Anion gap: 11 (ref 5–15)
BUN: 37 mg/dL — AB (ref 6–20)
CHLORIDE: 100 mmol/L — AB (ref 101–111)
CO2: 27 mmol/L (ref 22–32)
Calcium: 8.9 mg/dL (ref 8.9–10.3)
Creatinine, Ser: 1.43 mg/dL — ABNORMAL HIGH (ref 0.44–1.00)
GFR calc Af Amer: 47 mL/min — ABNORMAL LOW (ref >60)
GFR calc non Af Amer: 41 mL/min — ABNORMAL LOW (ref >60)
GLUCOSE: 285 mg/dL — AB (ref 65–99)
POTASSIUM: 4.5 mmol/L (ref 3.5–5.1)
SODIUM: 138 mmol/L (ref 135–145)
Total Bilirubin: 0.7 mg/dL (ref 0.3–1.2)
Total Protein: 7.4 g/dL (ref 6.5–8.1)

## 2016-08-31 LAB — CBC
HEMATOCRIT: 34.7 % — AB (ref 36.0–46.0)
HEMOGLOBIN: 10.9 g/dL — AB (ref 12.0–15.0)
MCH: 29.5 pg (ref 26.0–34.0)
MCHC: 31.4 g/dL (ref 30.0–36.0)
MCV: 94 fL (ref 78.0–100.0)
Platelets: 394 10*3/uL (ref 150–400)
RBC: 3.69 MIL/uL — AB (ref 3.87–5.11)
RDW: 15.1 % (ref 11.5–15.5)
WBC: 10.4 10*3/uL (ref 4.0–10.5)

## 2016-08-31 LAB — BRAIN NATRIURETIC PEPTIDE: B Natriuretic Peptide: 216.4 pg/mL — ABNORMAL HIGH (ref 0.0–100.0)

## 2016-08-31 MED ORDER — POTASSIUM CHLORIDE CRYS ER 20 MEQ PO TBCR
40.0000 meq | EXTENDED_RELEASE_TABLET | Freq: Once | ORAL | Status: AC
  Administered 2016-08-31: 40 meq via ORAL
  Filled 2016-08-31: qty 2

## 2016-08-31 MED ORDER — WARFARIN SODIUM 5 MG PO TABS: 5.0000 mg | ORAL_TABLET | Freq: Every day | ORAL | 0 refills | Status: DC

## 2016-08-31 MED ORDER — FUROSEMIDE 10 MG/ML IJ SOLN
80.0000 mg | Freq: Once | INTRAMUSCULAR | Status: AC
  Administered 2016-08-31: 80 mg via INTRAVENOUS
  Filled 2016-08-31: qty 8

## 2016-08-31 NOTE — Progress Notes (Signed)
Patient ID: [name], female   DOB: 11/11/1961, 55 y.o.   MRN: [id number]    Advanced Heart Failure Clinic Note   PCP: Dr Roxy Manns HF Cardiology: Dr. Aundra Dubin PV: Dr. Gwenlyn Found  55 yo with history of PAD, carotid stenosis s/p left CEA, relatively mild CAD, chronic systolic CHF, paroxysmal atrial fibrillation and prior substance abuse presents for CHF clinic followup.  She was admitted in 1/17 with acute hypoxemic respiratory failure in the setting of atrial fibrillation/RVR and volume overload.  She was initially intubated.  She converted back to NSR with amiodarone gtt.  She was treated with IV Lasix, steroids, bronchodilators.  She developed AKI and losartan was stopped.    She was admitted in 3/17 with symptomatic bradycardia.  She was supposed to stop diltiazem after this appointment but continued to take it.  She presented later in 3/17, again with symptomatic bradycardia (junctional bradycardia), hypotension, and AKI.  Diltiazem and bisoprolol were stopped.  HR recovered and is actually elevated today, rate around 100 bpm.  ACEI was stopped with AKI.   She presents today for regular follow up.  She called last week and had "lost" her medicine. We refilled her cardiac medications and she was instructed to call other providers for her non-HF medicines. She called coumadin clinic never followed up with them, as she is still off coumadin. Weight up 17 lbs from visit at the end of August.   She tells me today that she had been out of her medications and off everything for at least one week when she called [place]. She remains off coumadin. She states she is not any more short of breath than usual. Doesn't think she pees a lot extra on metolazone days. Saw PCP Dr. Sandi Mariscal last week and was started on Coreg. Has been taking bisoprolol and Coreg since 08/23/16. Weight at home up to 255, but that was 2 days ago. Still getting bad claudication pains. Not smoking regularly, occasionally shares a cigarette with her  sister.   Labs (1/17): K 5.2, creatinine 1.4, AST 43, ALT normal Labs (2/17): K 4.2, creatinine 1.3, BNP 607, TSH normal Labs (3/17): K 4.2, creatinine 1.45, AST/ALT normal, HCT 28.7 Labs (4/17): K 4.4, creatinine 1.61  PMH: 1. Carotid stenosis: Known occluded right carotid.  Left CEA in 4/16.  2. CAD: LHC in 12/15 with 80% stenosis in small OM1, nonobstructive disease in other territories.  3. Chronic systolic CHF: Nonischemic cardiomyopathy (?due to ETOH or prior drug abuse).  - Echo (1/17) with EF 45%, mild LV hypertrophy, moderate diastolic dysfunction, inferolateral severe hypokinesis, mildly decreased RV systolic function.  4. Atrial fibrillation: Paroxysmal.   5. Type II diabetes 6. CKD: Stage III. 7. COPD: Quit smoking 1/17. On home oxygen.  8. 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  No longer drinks.  9. Hypothyroidism 10. PAD: Atherectomy SFA in 2014 (Dr Gwenlyn Found).  Peripheral arterial dopplers (2/17) with focal 75-99% proximal right SFA stenosis, occluded mid-distal right SFA, chronic occlusion of all runoff arteries on right. 11. Anemia 12. Prior cocaine abuse.  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   SH: Lives with sister.  Prior cocaine abuse.  Prior ETOH abuse.  Quit smoking in 1/17.  FH: CAD  ROS: All systems reviewed and negative except as per HPI.   Current Outpatient Prescriptions  Medication Sig Dispense Refill  . amLODipine (NORVASC) 10 MG tablet Take 1 tablet (10 mg total) by mouth every evening. 21 tablet 0  .  atorvastatin (LIPITOR) 40 MG tablet Take 1 tablet (40 mg total) by mouth every evening. 21 tablet 0  . acetaminophen-codeine (TYLENOL #3) 300-30 MG tablet Take 1 tablet by mouth 2 (two) times daily as needed for moderate pain.    [name] albuterol (PROVENTIL HFA;VENTOLIN HFA) 108 (90 Base) MCG/ACT inhaler Inhale 2 puffs into the lungs every 6 (six) hours as needed for wheezing or shortness of breath. 1 each 3  . albuterol  (PROVENTIL) (2.5 MG/3ML) 0.083% nebulizer solution Take 3 mLs (2.5 mg total) by nebulization every 6 (six) hours as needed. For wheezing. 75 mL 2  . amiodarone (PACERONE) 100 MG tablet Take 1 tablet (100 mg total) by mouth daily. 21 tablet 0  . bisoprolol (ZEBETA) 5 MG tablet Take 0.5 tablets (2.5 mg total) by mouth every evening. 11 tablet 0  . Blood Glucose Monitoring Suppl (ACCU-CHEK AVIVA PLUS) W/DEVICE KIT 1 Device by Does not apply route 4 (four) times daily -  before meals and at bedtime. 1 kit 0  . budesonide-formoterol (SYMBICORT) 160-4.5 MCG/ACT inhaler Inhale 2 puffs into the lungs 2 (two) times daily. 1 Inhaler 12  . cholecalciferol (VITAMIN D) 1000 units tablet Take 1,000 Units by mouth daily.    . cyanocobalamin 1000 MCG tablet Take 1 tablet (1,000 mcg total) by mouth daily. 30 tablet 10  . DULoxetine (CYMBALTA) 30 MG capsule Take 30 mg by mouth daily.    . folic acid (FOLVITE) 1 MG tablet TAKE ONE TABLET BY MOUTH DAILY. 90 tablet 1  . gabapentin (NEURONTIN) 300 MG capsule Take 300 mg by mouth 4 (four) times daily.    . insulin aspart (NOVOLOG FLEXPEN) 100 UNIT/ML FlexPen Inject 24-44 Units into the skin 3 (three) times daily with meals.     . Insulin Glargine (LANTUS SOLOSTAR) 100 UNIT/ML Solostar Pen Inject 64 Units into the skin daily at 10 pm.     . Insulin Pen Needle (ULTICARE MICRO PEN NEEDLES) 32G X 4 MM MISC 1 Syringe by Does not apply route 4 (four) times daily - after meals and at bedtime. (Patient not taking: Reported on 08/11/2016) 1 each 11  . Lancets (ACCU-CHEK SOFT TOUCH) lancets Use as instructed (Patient not taking: Reported on 08/11/2016) 100 each 12  . levothyroxine (SYNTHROID, LEVOTHROID) 50 MCG tablet Take 1 tablet (50 mcg total) by mouth daily. 30 tablet 2  . losartan (COZAAR) 25 MG tablet Take 1 tablet (25 mg total) by mouth daily. 21 tablet 0  . metolazone (ZAROXOLYN) 2.5 MG tablet Take 1 tablet (2.5 mg total) by mouth once a week. On Thursday. May take an  additional 2.5 mg (1 tablet) as directed by HF clinic. 4 tablet 0  . nitroGLYCERIN (NITROSTAT) 0.4 MG SL tablet Place 1 tablet (0.4 mg total) under the tongue every 5 (five) minutes as needed for chest pain. 25 tablet 3  . potassium chloride SA (K-DUR,KLOR-CON) 20 MEQ tablet Take 1.5 tablets (30 mEq total) by mouth daily. Take an additional 1 and 1/2 tablets (30 meq) on Thursday with metolazone 36 tablet 0  . torsemide (DEMADEX) 20 MG tablet Take 4 tablets (80 mg total) by mouth daily. 84 tablet 0  . warfarin (COUMADIN) 5 MG tablet Take 1-1.5 tablets (5-7.5 mg total) by mouth daily. Take 1 tablet (5 mg) daily except 1 and 1/2 tablets (7.5 mg) on Monday, Wednesday and Friday 26 tablet 0   No current facility-administered medications for this encounter.      Vitals:   08/31/16 1339  BP:  122/60  Pulse: 63  SpO2: 94%  Weight: 266 lb 4 oz (120.8 kg)   Wt Readings from Last 3 Encounters:  08/31/16 266 lb 4 oz (120.8 kg)  08/11/16 258 lb (117 kg)  07/07/16 249 lb 12.8 oz (113.3 kg)     General: NAD Neck: Thick, JVP to jaw. No thyromegaly or nodule noted.   Lungs: CTAB, normal effort CV: Nondisplaced PMI.  Heart regular S1/S2, no S3/S4, no murmur.  No carotid bruit.  Unable to palpate pedal pulses.  Trace right radial pulse, 2+ left radial pulse.  Abdomen: Obese, soft, NT, ND, no HSM. No bruits or masses. +BS  Skin: Intact without lesions or rashes.  Neurologic: Alert and oriented x 3.   Psych: Normal affect. Extremities: No clubbing or cyanosis. UNNA boots in place, 1-2+ edema to knees. HEENT: Normal.   Assessment/Plan: 1. Chronic systolic CHF: EF 16% on last echo. Has been presumed nonischemic given LHC in 12/15 showing only 80% stenosis in small OM1.  - Volume status elevated, thought remains NYHA class II-III (suspect this is purely from lack of activity. - Will give 80 mg IV lasix and 40 meq of potassium in clinic today.  - Continue torsemide 80 mg daily.  - Continue metolazone 2.5  mg Thursdays with extra 20 meq of potassium.  BMET today.  - Continue bisoprolol 2.5 mg daily. Stop Coreg. She was started on coreg last week by Dr Sandi Mariscal. Will use bisoprolol with COPD and previous bradycardia on doses of Coreg.  - Continue losartan 25 mg daily.  BMET BNP today.   - Encouraged to limit fluid and sodium intake.  2. Atrial fibrillation:  - Remiins in NSR by exam.  - Continue amiodarone 100 mg daily.  - She has been off coumadin for going on two weeks. She was supposed to have followed up with coumadin clinic about receiving more, but has not done so. She states she "didn't know she was supposed to call them back". - Will send 30 tablets of 5 mg Coumadin to bridge until she gets her mail order prescriptions on 09/05/16. Pt was instructed to call coumadin clinic for directions while still in our office and now has instructions as well as follow up scheduled. Please see phone note from [facility].   - Continue amiodarone 100 daily.  PCP following hypothyroidism (on Levoxyl).  AST/ALT normal on labs today.  in - Needs regular eye exams. 3. PAD: Bilateral calf claudication seems quit limiting.  No rest pain or pedal ulcers.  Peripheral arterial dopplers show severe disease on right.  Not candidate for cilostazol with CHF.  Not good candidate for intervention per Dr Gwenlyn Found (seen recently).  4. COPD: On home oxygen.   - Smoking very little, encouraged to stop completely.  5. H/o cirrhosis: Likely from ETOH, no longer drinks.  6. Suspected OSA:  - Needs sleep study.  7. CKD stage III: - BMET today.  8. HTN:  - Stopping Coreg with duplicate BB therapy. Otherwise stable from this perspective now back on meds.  9. Bradycardia: Junctional rhythm when hospitalized in 3/17.   - Stable on low dose bisoprolol 2.5 mg daily.  Will not increase. - Pulse lower in 60s today. Stopping Coreg. Has been taking this + bisoprolol since last week. 10. Poorly healing leg wounds - Sees wound  clinic in [facility].  - Stable currently.      Given 80 mg IV lasix in clinic with good output. Labs  relatively stable considering medical non-compliance.  Pt given 30 tablets of Coumadin to bridge until she gets meds refilled.  She called Coumadin clinic while in our office to set up follow up, and her sister went to get her coumadin while she was being observed for lasix.   She is at high risk for readmission with poor insight into her disease and medical non-compliance.   Satira Mccallum Melenda Bielak PA-C 08/31/2016  Total time spent > 45 minutes. Over half that spent discussing the above.

## 2016-08-31 NOTE — Progress Notes (Signed)
80 mg IV Lasix and 40 meq of KCL administered to pt, pt tolerated well with no complaints

## 2016-08-31 NOTE — Telephone Encounter (Signed)
Was off warfarin about 2 weeks, lost meds and was unable to replace immediately.  Advised that she take 2 tabs (10 mg) for 2 days then resume with her previous routine (5 mg qd x 7.5 mg MWF).  Will keep previously scheduled INR appt

## 2016-08-31 NOTE — Patient Instructions (Addendum)
Restart Coumadin   Labs today  Please weigh yourself daily and let us know if not improving  Your physician recommends that you schedule a follow-up appointment in: 1 week

## 2016-08-31 NOTE — Progress Notes (Signed)
22g IV started on pt's left arm, 1st attempt, pt tolerated well.

## 2016-09-02 ENCOUNTER — Other Ambulatory Visit: Payer: Self-pay | Admitting: Family Medicine

## 2016-09-02 ENCOUNTER — Other Ambulatory Visit (HOSPITAL_COMMUNITY): Payer: Self-pay | Admitting: Cardiology

## 2016-09-02 DIAGNOSIS — I5043 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) and diastolic (congestive) heart failure: Secondary | ICD-10-CM

## 2016-09-05 ENCOUNTER — Telehealth: Payer: Self-pay | Admitting: *Deleted

## 2016-09-05 NOTE — Telephone Encounter (Signed)
Received incoming fax request for clearance for patient a Lymphapress Compression Pump by Eulogio Bear PA-C at [facility] at settings of 27mmHg 2-3 times a day for 60 minutes per session for the treatment of secondary lymphedema: scarring of the lymph channels due to Cellulitis and/or Lymphangitis.  Please let us know if this is appropriate for patient concerning history of CHF.  Routing to Dr Gwenlyn Found for advice.

## 2016-09-06 NOTE — Telephone Encounter (Signed)
Shouldn't be a problem.

## 2016-09-07 ENCOUNTER — Ambulatory Visit (HOSPITAL_COMMUNITY)
Admission: RE | Admit: 2016-09-07 | Discharge: 2016-09-07 | Disposition: A | Discharge status: Home / Self Care | Payer: Medicare Other | Source: Ambulatory Visit | Attending: Adult Health | Admitting: Adult Health

## 2016-09-07 VITALS — BP 140/72 | HR 79 | Wt 256.4 lb

## 2016-09-07 DIAGNOSIS — Z8249 Family history of ischemic heart disease and other diseases of the circulatory system: Secondary | ICD-10-CM | POA: Diagnosis not present

## 2016-09-07 DIAGNOSIS — Z9981 Dependence on supplemental oxygen: Secondary | ICD-10-CM | POA: Insufficient documentation

## 2016-09-07 DIAGNOSIS — I480 Paroxysmal atrial fibrillation: Secondary | ICD-10-CM | POA: Diagnosis not present

## 2016-09-07 DIAGNOSIS — N183 Chronic kidney disease, stage 3 (moderate): Secondary | ICD-10-CM | POA: Diagnosis not present

## 2016-09-07 DIAGNOSIS — I10 Essential (primary) hypertension: Secondary | ICD-10-CM

## 2016-09-07 DIAGNOSIS — E039 Hypothyroidism, unspecified: Secondary | ICD-10-CM | POA: Insufficient documentation

## 2016-09-07 DIAGNOSIS — J449 Chronic obstructive pulmonary disease, unspecified: Secondary | ICD-10-CM | POA: Insufficient documentation

## 2016-09-07 DIAGNOSIS — I739 Peripheral vascular disease, unspecified: Secondary | ICD-10-CM | POA: Diagnosis not present

## 2016-09-07 DIAGNOSIS — I5022 Chronic systolic (congestive) heart failure: Secondary | ICD-10-CM | POA: Insufficient documentation

## 2016-09-07 DIAGNOSIS — Z79899 Other long term (current) drug therapy: Secondary | ICD-10-CM | POA: Diagnosis not present

## 2016-09-07 DIAGNOSIS — Z794 Long term (current) use of insulin: Secondary | ICD-10-CM | POA: Diagnosis not present

## 2016-09-07 DIAGNOSIS — Z87891 Personal history of nicotine dependence: Secondary | ICD-10-CM | POA: Diagnosis not present

## 2016-09-07 DIAGNOSIS — E1122 Type 2 diabetes mellitus with diabetic chronic kidney disease: Secondary | ICD-10-CM | POA: Diagnosis not present

## 2016-09-07 DIAGNOSIS — I130 Hypertensive heart and chronic kidney disease with heart failure and stage 1 through stage 4 chronic kidney disease, or unspecified chronic kidney disease: Secondary | ICD-10-CM | POA: Insufficient documentation

## 2016-09-07 DIAGNOSIS — R001 Bradycardia, unspecified: Secondary | ICD-10-CM | POA: Diagnosis not present

## 2016-09-07 DIAGNOSIS — I2510 Atherosclerotic heart disease of native coronary artery without angina pectoris: Secondary | ICD-10-CM | POA: Diagnosis not present

## 2016-09-07 DIAGNOSIS — K7460 Unspecified cirrhosis of liver: Secondary | ICD-10-CM | POA: Diagnosis not present

## 2016-09-07 LAB — BASIC METABOLIC PANEL
ANION GAP: 10 (ref 5–15)
BUN: 23 mg/dL — ABNORMAL HIGH (ref 6–20)
CHLORIDE: 100 mmol/L — AB (ref 101–111)
CO2: 27 mmol/L (ref 22–32)
CREATININE: 1.21 mg/dL — AB (ref 0.44–1.00)
Calcium: 8.9 mg/dL (ref 8.9–10.3)
GFR calc non Af Amer: 50 mL/min — ABNORMAL LOW (ref >60)
GFR, EST AFRICAN AMERICAN: 58 mL/min — AB (ref >60)
Glucose, Bld: 142 mg/dL — ABNORMAL HIGH (ref 65–99)
POTASSIUM: 4.7 mmol/L (ref 3.5–5.1)
SODIUM: 137 mmol/L (ref 135–145)

## 2016-09-07 LAB — BRAIN NATRIURETIC PEPTIDE: B NATRIURETIC PEPTIDE 5: 251.5 pg/mL — AB (ref 0.0–100.0)

## 2016-09-07 MED ORDER — METOLAZONE 2.5 MG PO TABS: 2.5000 mg | ORAL_TABLET | ORAL | 5 refills | Status: DC

## 2016-09-07 NOTE — Progress Notes (Signed)
Patient ID: [name], female   DOB: 01-Oct-1961, 55 y.o.   MRN: [id number]    Advanced Heart Failure Clinic Note   PCP: Dr Roxy Manns HF Cardiology: Dr. Aundra Dubin PV: Dr. Gwenlyn Found  55 yo with history of PAD, carotid stenosis s/p left CEA, relatively mild CAD, chronic systolic CHF, paroxysmal atrial fibrillation and prior substance abuse presents for CHF clinic followup.  She was admitted in 1/17 with acute hypoxemic respiratory failure in the setting of atrial fibrillation/RVR and volume overload.  She was initially intubated.  She converted back to NSR with amiodarone gtt.  She was treated with IV Lasix, steroids, bronchodilators.  She developed AKI and losartan was stopped.    She was admitted in 3/17 with symptomatic bradycardia.  She was supposed to stop diltiazem after this appointment but continued to take it.  She presented later in 3/17, again with symptomatic bradycardia (junctional bradycardia), hypotension, and AKI.  Diltiazem and bisoprolol were stopped.  HR recovered and is actually elevated today, rate around 100 bpm.  ACEI was stopped with AKI.   She returns for HF follow up.Last visit she received 80 mg IV lasix in the clinic. Weight at home has been trending down from 266 to 262 pounds. She is chair bound. She can only walk a few steps. Mild dyspnea with exertion. Denies PND /orthopnea. No bleeding problems.Eating high salt diet. She has all medications. Followed by Paramedicine.   Labs (1/17): K 5.2, creatinine 1.4, AST 43, ALT normal Labs (2/17): K 4.2, creatinine 1.3, BNP 607, TSH normal Labs (3/17): K 4.2, creatinine 1.45, AST/ALT normal, HCT 28.7 Labs (4/17): K 4.4, creatinine 1.61 Labs (08/31/2016): K 4.5 Creatinine 1.43   PMH: 1. Carotid stenosis: Known occluded right carotid.  Left CEA in 4/16.  2. CAD: LHC in 12/15 with 80% stenosis in small OM1, nonobstructive disease in other territories.  3. Chronic systolic CHF: Nonischemic cardiomyopathy (?due to ETOH or prior drug  abuse).  - Echo (1/17) with EF 45%, mild LV hypertrophy, moderate diastolic dysfunction, inferolateral severe hypokinesis, mildly decreased RV systolic function.  4. Atrial fibrillation: Paroxysmal.   5. Type II diabetes 6. CKD: Stage III. 7. COPD: Quit smoking 1/17. On home oxygen.  8. 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  No longer drinks.  9. Hypothyroidism 10. PAD: Atherectomy SFA in 2014 (Dr Gwenlyn Found).  Peripheral arterial dopplers (2/17) with focal 75-99% proximal right SFA stenosis, occluded mid-distal right SFA, chronic occlusion of all runoff arteries on right. 11. Anemia 12. Prior cocaine abuse.  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   SH: Lives with sister.  Prior cocaine abuse.  Prior ETOH abuse.  Quit smoking in 1/17.  FH: CAD  ROS: All systems reviewed and negative except as per HPI.   Current Outpatient Prescriptions  Medication Sig Dispense Refill  . acetaminophen-codeine (TYLENOL #3) 300-30 MG tablet Take 1 tablet by mouth 2 (two) times daily as needed for moderate pain.    [name] albuterol (PROVENTIL HFA;VENTOLIN HFA) 108 (90 Base) MCG/ACT inhaler Inhale 2 puffs into the lungs every 6 (six) hours as needed for wheezing or shortness of breath. 1 each 3  . albuterol (PROVENTIL) (2.5 MG/3ML) 0.083% nebulizer solution Take 3 mLs (2.5 mg total) by nebulization every 6 (six) hours as needed. For wheezing. 75 mL 2  . amiodarone (PACERONE) 100 MG tablet Take 1 tablet (100 mg total) by mouth daily. 21 tablet 0  . atorvastatin (LIPITOR) 40 MG tablet Take 1 tablet (40 mg  total) by mouth every evening. 21 tablet 0  . bisoprolol (ZEBETA) 5 MG tablet Take 0.5 tablets (2.5 mg total) by mouth every evening. 11 tablet 0  . Blood Glucose Monitoring Suppl (ACCU-CHEK AVIVA PLUS) W/DEVICE KIT 1 Device by Does not apply route 4 (four) times daily -  before meals and at bedtime. 1 kit 0  . budesonide-formoterol (SYMBICORT) 160-4.5 MCG/ACT inhaler Inhale 2 puffs into  the lungs 2 (two) times daily. 1 Inhaler 12  . cholecalciferol (VITAMIN D) 1000 units tablet Take 1,000 Units by mouth daily.    . cyanocobalamin 1000 MCG tablet Take 1 tablet (1,000 mcg total) by mouth daily. 30 tablet 10  . DULoxetine (CYMBALTA) 30 MG capsule Take 30 mg by mouth daily.    . folic acid (FOLVITE) 1 MG tablet TAKE ONE TABLET BY MOUTH DAILY. 90 tablet 1  . insulin aspart (NOVOLOG FLEXPEN) 100 UNIT/ML FlexPen Inject 24-44 Units into the skin 3 (three) times daily with meals.     . Insulin Glargine (LANTUS SOLOSTAR) 100 UNIT/ML Solostar Pen Inject 64 Units into the skin daily at 10 pm.     . Insulin Pen Needle (ULTICARE MICRO PEN NEEDLES) 32G X 4 MM MISC 1 Syringe by Does not apply route 4 (four) times daily - after meals and at bedtime. 1 each 11  . Lancets (ACCU-CHEK SOFT TOUCH) lancets Use as instructed 100 each 12  . levothyroxine (SYNTHROID, LEVOTHROID) 50 MCG tablet Take 1 tablet (50 mcg total) by mouth daily. 30 tablet 2  . losartan (COZAAR) 25 MG tablet Take 1 tablet (25 mg total) by mouth daily. 21 tablet 0  . metolazone (ZAROXOLYN) 2.5 MG tablet Take 1 tablet (2.5 mg total) by mouth once a week. On Thursday. May take an additional 2.5 mg (1 tablet) as directed by HF clinic. 4 tablet 0  . potassium chloride SA (K-DUR,KLOR-CON) 20 MEQ tablet Take 1.5 tablets (30 mEq total) by mouth daily. Take an additional 1 and 1/2 tablets (30 meq) on Thursday with metolazone 36 tablet 0  . torsemide (DEMADEX) 20 MG tablet Take 4 tablets (80 mg total) by mouth daily. 84 tablet 0  . warfarin (COUMADIN) 5 MG tablet Take 1-1.5 tablets (5-7.5 mg total) by mouth daily. Take 1 tablet (5 mg) daily except 1 & 1/2 tablets (7.5 mg) on Mon, Wed and Fri 30 tablet 0  . nitroGLYCERIN (NITROSTAT) 0.4 MG SL tablet Place 1 tablet (0.4 mg total) under the tongue every 5 (five) minutes as needed for chest pain. (Patient not taking: Reported on 09/07/2016) 25 tablet 3   No current facility-administered  medications for this encounter.      Vitals:   09/07/16 1515  BP: 140/72  Pulse: 79  SpO2: 93%  Weight: 256 lb 6.4 oz (116.3 kg)   Wt Readings from Last 3 Encounters:  09/07/16 256 lb 6.4 oz (116.3 kg)  08/31/16 266 lb 4 oz (120.8 kg)  08/11/16 258 lb (117 kg)     General: NAD Arrive in a wheel chair with her sister.  Neck: Thick, JVP 6-7. No thyromegaly or nodule noted.   Lungs: CTAB, normal effort CV: Nondisplaced PMI.  Heart regular S1/S2, no S3/S4, no murmur.  No carotid bruit.  Abdomen: Obese, soft, NT, ND, no HSM. No bruits or masses. +BS  Skin: Intact without lesions or rashes.  Neurologic: Alert and oriented x 3.   Psych: Normal affect. Extremities: No clubbing or cyanosis. R and LLE compression wraps. 1+ edema to knees. HEENT:  Normal.   Assessment/Plan: 1. Chronic systolic CHF: EF 38% on last echo. Has been presumed nonischemic given LHC in 12/15 showing only 80% stenosis in small OM1.NYHA class II-III  - Volume status improved. Continue torsemide 80 mg daily and metolazone 2.5 mg Thursdays with extra 20 meq of potassium.   - Continue bisoprolol 2.5 mg daily. Continue bisoprolol with COPD and previous bradycardia on doses of Coreg.  - Continue losartan 25 mg daily.     BMET today. 2. PAF:  - Regular pulse. Continue amiodarone 100 mg daily.    PCP following hypothyroidism (on Levoxyl).   Needs regular eye exams. 3. PAD: Bilateral calf claudication seems quit limiting.  No rest pain or pedal ulcers.  Peripheral arterial dopplers show severe disease on right.  Not candidate for cilostazol with CHF.  Not good candidate for intervention per Dr Gwenlyn Found (seen recently).  4. COPD: On home oxygen.   - Counseled Smoking very little, encouraged to stop completely.  5. H/o cirrhosis: Likely from ETOH, no longer drinks.  6. Suspected OSA:  - Needs sleep study. Will set up next visit.  7. CKD stage III: - BMET today.  8. HTN:   Stable.   9. Bradycardia: Junctional rhythm when  hospitalized in 3/17.   - Todays heart rate is 79 . Continue low dose bisoprolol 2.5 mg daily.   10. Chronic leg wound - Followed at wound clinic in [facility].    Follow up in 3 weeks.       Desiraye Rolfson NP-C  09/07/2016

## 2016-09-07 NOTE — Patient Instructions (Signed)
Routine lab work today. Will notify you of abnormal results, otherwise no news is good news!  Follow up 3 weeks with Oda Kilts PA-C.  Do the following things EVERYDAY: 1) Weigh yourself in the morning before breakfast. Write it down and keep it in a log. 2) Take your medicines as prescribed 3) Eat low salt foods-Limit salt (sodium) to 2000 mg per day.  4) Stay as active as you can everyday 5) Limit all fluids for the day to less than 2 liters

## 2016-09-07 NOTE — Progress Notes (Signed)
Advanced Heart Failure Medication Review by a Pharmacist  Does the patient  feel that his/her medications are working for him/her?  yes  Has the patient been experiencing any side effects to the medications prescribed?  no  Does the patient measure his/her own blood pressure or blood glucose at home?  yes   Does the patient have any problems obtaining medications due to transportation or finances?   no  Understanding of regimen: good Understanding of indications: good Potential of compliance: good Patient understands to avoid NSAIDs. Patient understands to avoid decongestants.  Issues to address at subsequent visits: Compliance   Pharmacist comments:  [name] is a 55 yo F presenting with a family friend and [name] (paramedicine) and her medication bottles. She was just sent her refills from [place] and did not have any specific medication-related questions or concerns for me at this time.   [name]. [name], PharmD, BCPS, CPP Clinical Pharmacist Pager: [phone] Phone: [phone] 09/07/2016 3:30 PM      Time with patient: 10 minutes Preparation and documentation time: 2 minutes Total time: 12 minutes

## 2016-09-07 NOTE — Telephone Encounter (Signed)
Encounter routed to number provided via [employer] fax.

## 2016-09-12 ENCOUNTER — Ambulatory Visit (INDEPENDENT_AMBULATORY_CARE_PROVIDER_SITE_OTHER): Payer: Medicare Other | Admitting: Pharmacist Clinician (PhC)/ Clinical Pharmacy Specialist

## 2016-09-12 DIAGNOSIS — Z5181 Encounter for therapeutic drug level monitoring: Secondary | ICD-10-CM

## 2016-09-12 DIAGNOSIS — I4891 Unspecified atrial fibrillation: Secondary | ICD-10-CM | POA: Diagnosis not present

## 2016-09-12 DIAGNOSIS — I480 Paroxysmal atrial fibrillation: Secondary | ICD-10-CM | POA: Diagnosis not present

## 2016-09-12 LAB — POCT INR: INR: 2.1
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
## 2016-09-26 IMAGING — CR DG CHEST 1V PORT
2 series · 2 of 2 positions shown · non-contrast
Comparison: Study obtained earlier in the day

CLINICAL DATA: Cardiac arrhythmia with temporary pacemaker
insertion.

EXAM:
PORTABLE CHEST 1 VIEW

[AP (1 of 2)]
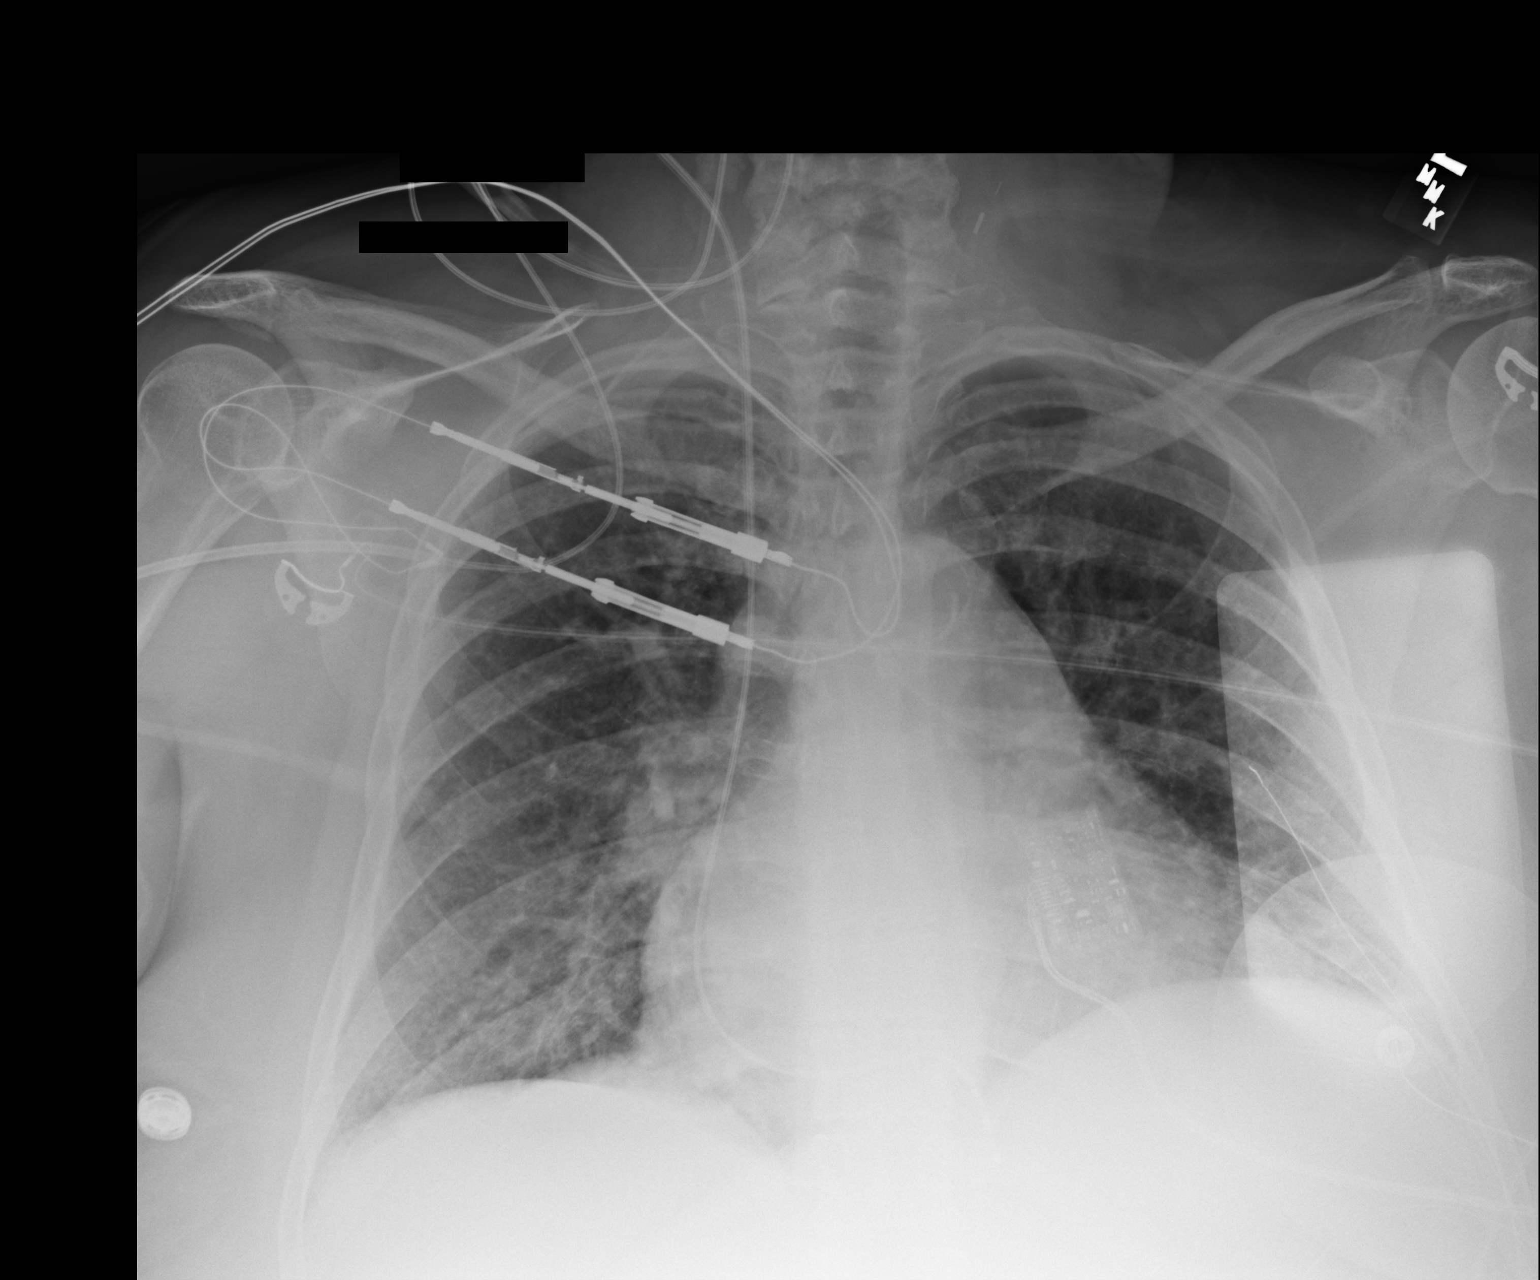

[AP (2 of 2)]
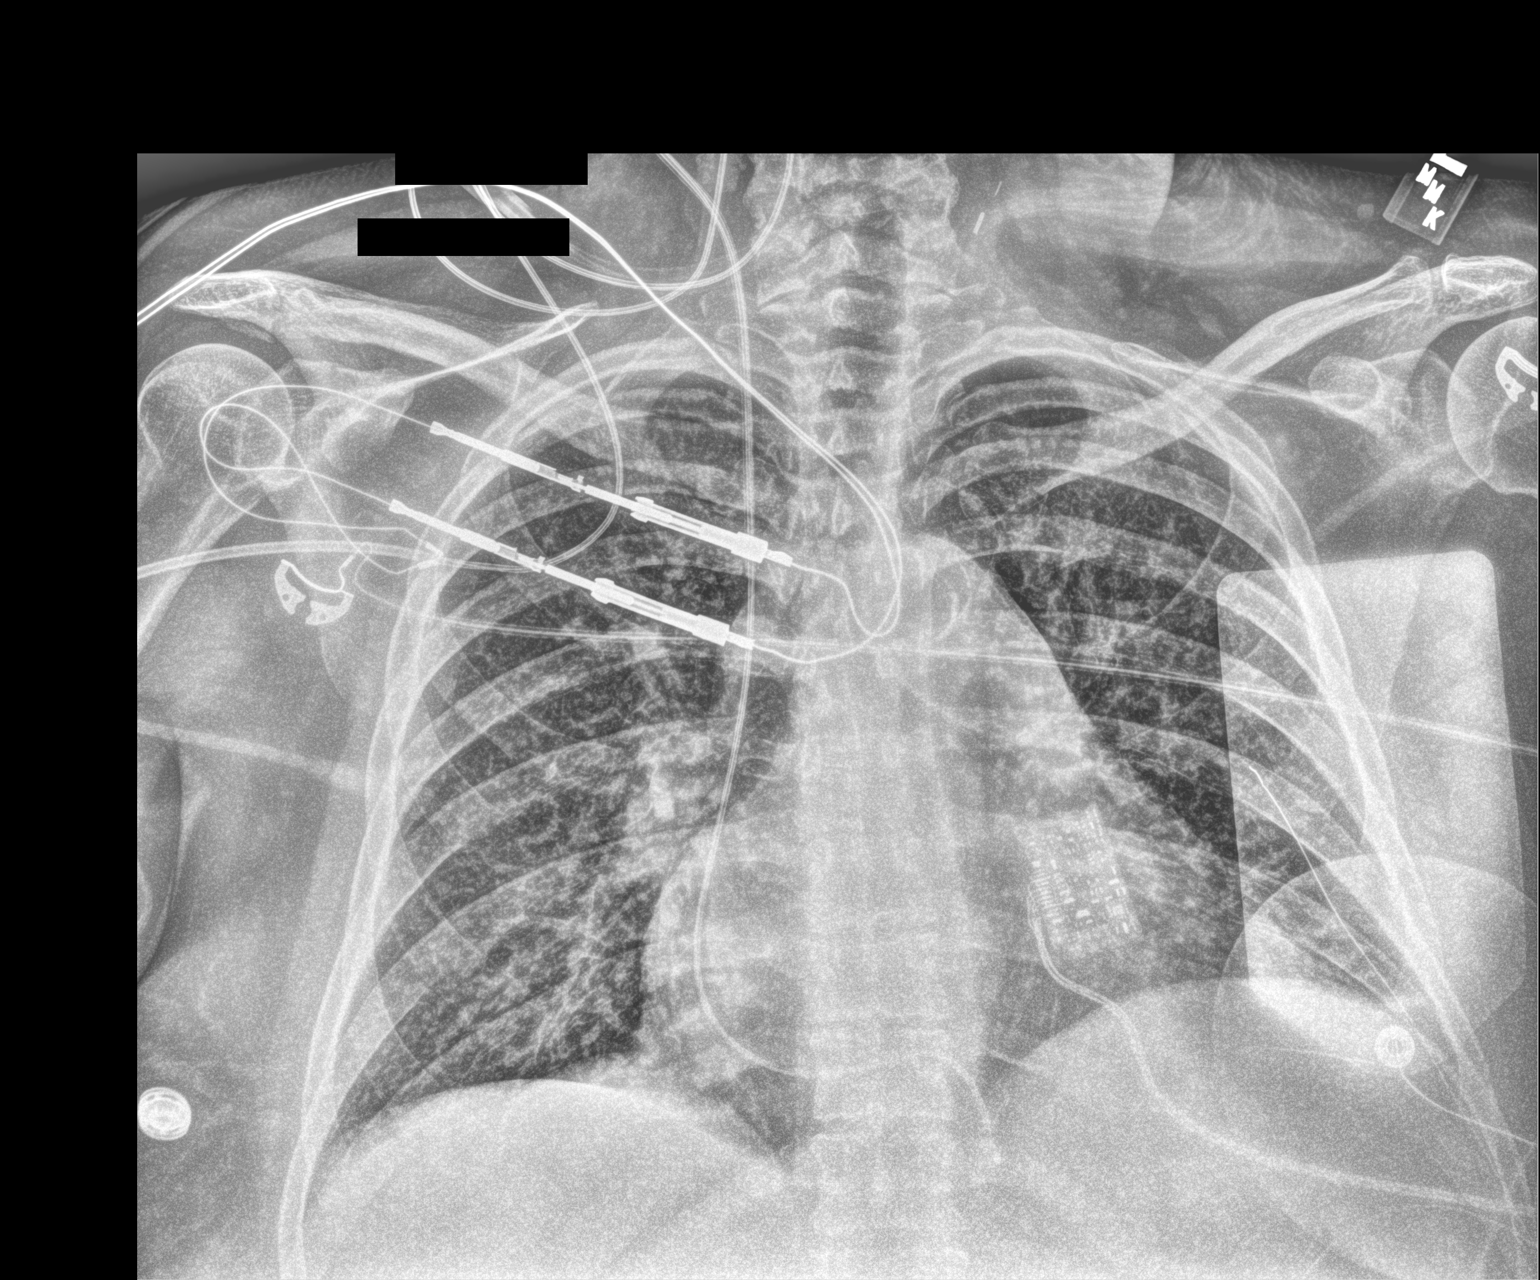

[2 of 2 positions shown; findings below may reference images not displayed]

FINDINGS: The temporary pacemaker lead is attached to the right ventricle. No
pneumothorax. There is no edema or consolidation. Heart is mildly
enlarged with pulmonary vascularity within normal limits. No
adenopathy. No bone lesions. There are surgical clips in the left
neck region.
IMPRESSION: Pacemaker lead tip at level of right ventricle. No pneumothorax.
Stable cardiac prominence. No edema or consolidation.

## 2016-09-26 IMAGING — CR DG CHEST 1V PORT
1 series · 1 of 1 positions shown · non-contrast
Comparison: Chest x-ray 11/27/2015.

CLINICAL DATA: 54-year-old female with history of bradycardia.
Shaking. Chest pain.

EXAM:
PORTABLE CHEST 1 VIEW

[AP]
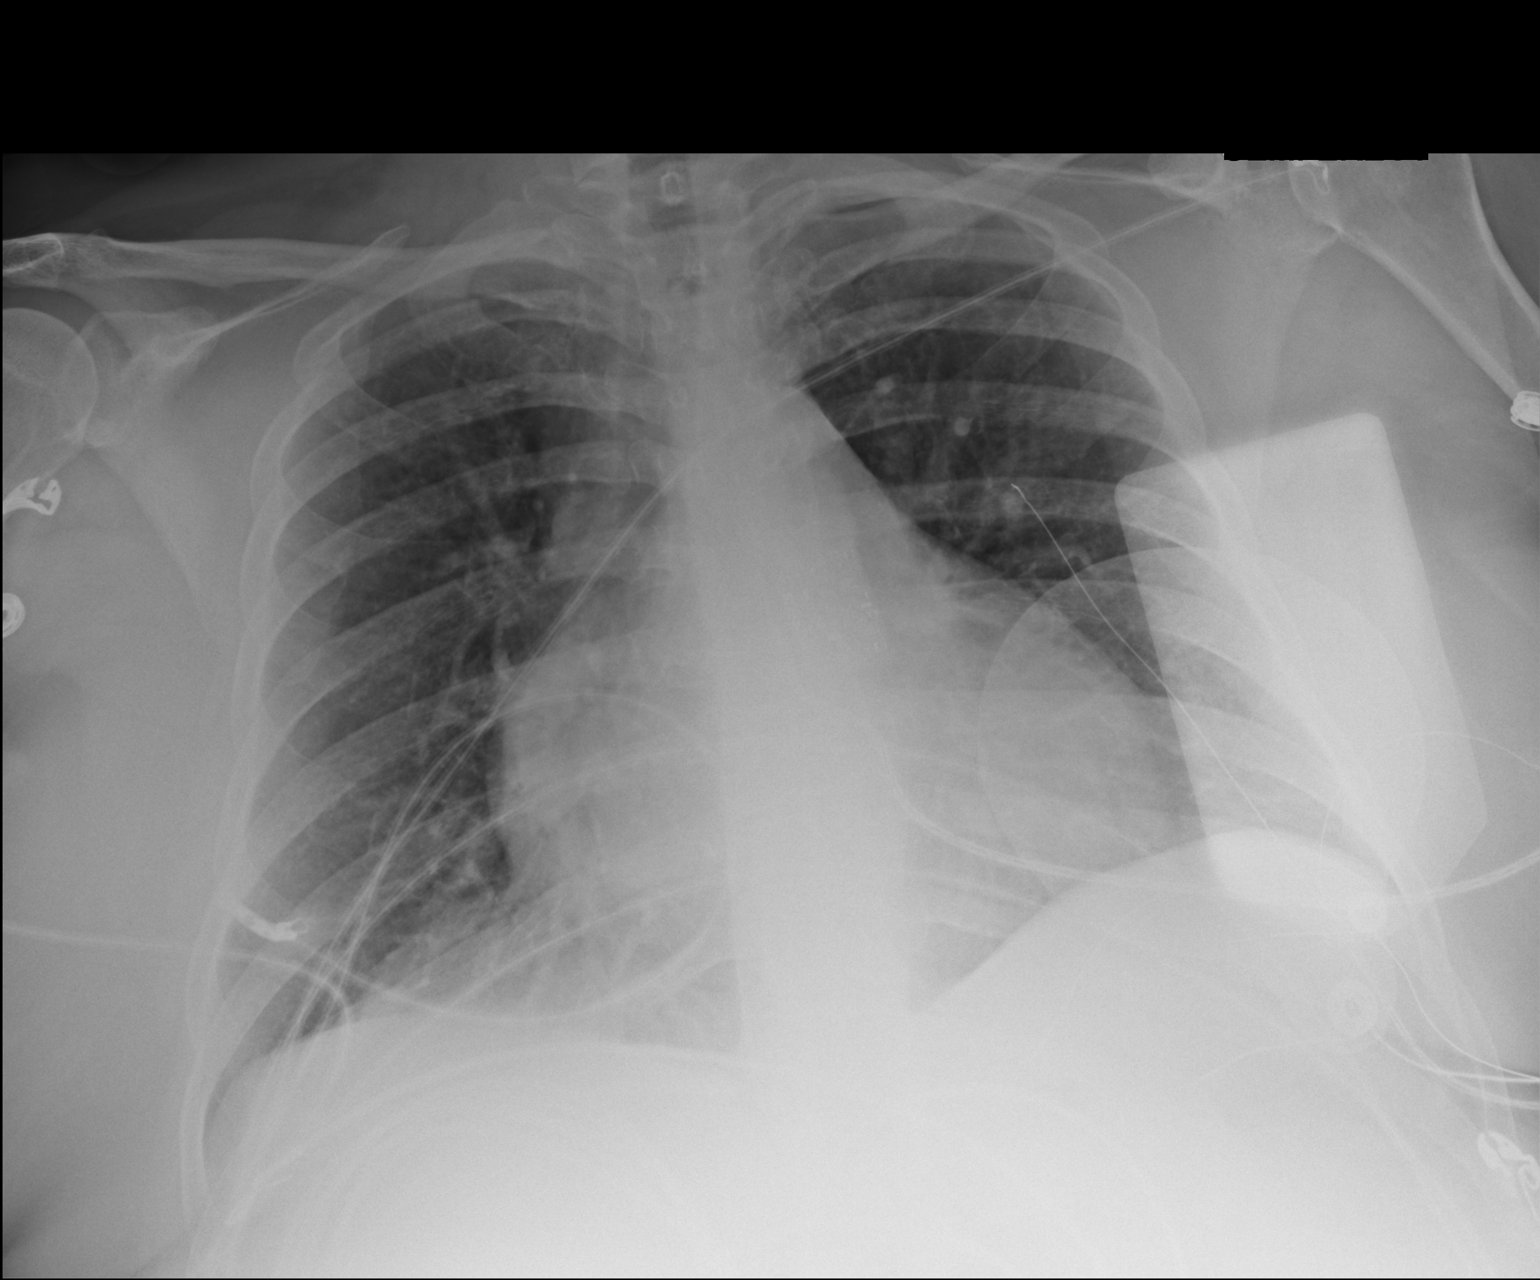

[1 of 1 positions shown; findings below may reference images not displayed]

FINDINGS: Transcutaneous defibrillator pads projecting over the lower left
hemithorax. Low lung volumes. No acute consolidative airspace
disease. No pleural effusions. No evidence of pulmonary edema. No
pneumothorax. Heart size is mildly enlarged (unchanged). The patient
is rotated to the right on today's exam, resulting in distortion of
the mediastinal contours and reduced diagnostic sensitivity and
specificity for mediastinal pathology. Atherosclerotic
calcifications in the thoracic aorta.
IMPRESSION: 1. Low lung volumes without radiographic evidence of acute
cardiopulmonary disease.
2. Mild cardiomegaly.
3. Atherosclerosis.

## 2016-09-28 ENCOUNTER — Encounter (HOSPITAL_COMMUNITY): Payer: Self-pay

## 2016-09-28 ENCOUNTER — Ambulatory Visit (HOSPITAL_COMMUNITY)
Admission: RE | Admit: 2016-09-28 | Discharge: 2016-09-28 | Disposition: A | Discharge status: Home / Self Care | Payer: Medicare Other | Source: Ambulatory Visit | Attending: Internal Medicine | Admitting: Internal Medicine

## 2016-09-28 VITALS — BP 122/66 | HR 71 | Wt 252.4 lb

## 2016-09-28 DIAGNOSIS — R0683 Snoring: Secondary | ICD-10-CM

## 2016-09-28 DIAGNOSIS — Z79899 Other long term (current) drug therapy: Secondary | ICD-10-CM | POA: Insufficient documentation

## 2016-09-28 DIAGNOSIS — E1151 Type 2 diabetes mellitus with diabetic peripheral angiopathy without gangrene: Secondary | ICD-10-CM | POA: Diagnosis not present

## 2016-09-28 DIAGNOSIS — I480 Paroxysmal atrial fibrillation: Secondary | ICD-10-CM | POA: Diagnosis not present

## 2016-09-28 DIAGNOSIS — K7460 Unspecified cirrhosis of liver: Secondary | ICD-10-CM | POA: Diagnosis not present

## 2016-09-28 DIAGNOSIS — E039 Hypothyroidism, unspecified: Secondary | ICD-10-CM | POA: Insufficient documentation

## 2016-09-28 DIAGNOSIS — J449 Chronic obstructive pulmonary disease, unspecified: Secondary | ICD-10-CM | POA: Insufficient documentation

## 2016-09-28 DIAGNOSIS — Z8249 Family history of ischemic heart disease and other diseases of the circulatory system: Secondary | ICD-10-CM | POA: Insufficient documentation

## 2016-09-28 DIAGNOSIS — R001 Bradycardia, unspecified: Secondary | ICD-10-CM | POA: Insufficient documentation

## 2016-09-28 DIAGNOSIS — Z794 Long term (current) use of insulin: Secondary | ICD-10-CM | POA: Insufficient documentation

## 2016-09-28 DIAGNOSIS — I2510 Atherosclerotic heart disease of native coronary artery without angina pectoris: Secondary | ICD-10-CM | POA: Insufficient documentation

## 2016-09-28 DIAGNOSIS — Z7901 Long term (current) use of anticoagulants: Secondary | ICD-10-CM | POA: Insufficient documentation

## 2016-09-28 DIAGNOSIS — I5022 Chronic systolic (congestive) heart failure: Secondary | ICD-10-CM | POA: Diagnosis present

## 2016-09-28 DIAGNOSIS — E1122 Type 2 diabetes mellitus with diabetic chronic kidney disease: Secondary | ICD-10-CM | POA: Diagnosis not present

## 2016-09-28 DIAGNOSIS — Z87891 Personal history of nicotine dependence: Secondary | ICD-10-CM | POA: Diagnosis not present

## 2016-09-28 DIAGNOSIS — Z9981 Dependence on supplemental oxygen: Secondary | ICD-10-CM | POA: Diagnosis not present

## 2016-09-28 DIAGNOSIS — I10 Essential (primary) hypertension: Secondary | ICD-10-CM

## 2016-09-28 DIAGNOSIS — N183 Chronic kidney disease, stage 3 (moderate): Secondary | ICD-10-CM | POA: Insufficient documentation

## 2016-09-28 DIAGNOSIS — I130 Hypertensive heart and chronic kidney disease with heart failure and stage 1 through stage 4 chronic kidney disease, or unspecified chronic kidney disease: Secondary | ICD-10-CM | POA: Diagnosis present

## 2016-09-28 DIAGNOSIS — I779 Disorder of arteries and arterioles, unspecified: Secondary | ICD-10-CM | POA: Diagnosis not present

## 2016-09-28 DIAGNOSIS — Z720 Tobacco use: Secondary | ICD-10-CM

## 2016-09-28 DIAGNOSIS — I739 Peripheral vascular disease, unspecified: Secondary | ICD-10-CM

## 2016-09-28 LAB — BASIC METABOLIC PANEL
Anion gap: 9 (ref 5–15)
BUN: 28 mg/dL — AB (ref 6–20)
CALCIUM: 8.9 mg/dL (ref 8.9–10.3)
CO2: 30 mmol/L (ref 22–32)
CREATININE: 1.41 mg/dL — AB (ref 0.44–1.00)
Chloride: 98 mmol/L — ABNORMAL LOW (ref 101–111)
GFR calc Af Amer: 48 mL/min — ABNORMAL LOW (ref >60)
GFR calc non Af Amer: 42 mL/min — ABNORMAL LOW (ref >60)
GLUCOSE: 261 mg/dL — AB (ref 65–99)
Potassium: 4.2 mmol/L (ref 3.5–5.1)
Sodium: 137 mmol/L (ref 135–145)

## 2016-09-28 LAB — BRAIN NATRIURETIC PEPTIDE: B Natriuretic Peptide: 97.8 pg/mL (ref 0.0–100.0)

## 2016-09-28 NOTE — Addendum Note (Signed)
Encounter addended by: Louann Liv, LCSW on: 09/28/2016  2:51 PM<BR>    Actions taken: Sign clinical note

## 2016-09-28 NOTE — Progress Notes (Signed)
Patient is a known to paramedicine program. CSW meet with patient in the clinic for follow up visit. Patient reports she and her sister recently [place] to [place] and have their own place. Patient very happy to have housing. Patient states [name] paramedic has visited new home and patient pleased with support of program. CSW continues to follow for coordination of paramedic program. Raquel Sarna, LCSW [phone]

## 2016-09-28 NOTE — Patient Instructions (Signed)
Routine lab work today. Will notify you of abnormal results  Follow up in 4-6 weeks with [facility]   Your provider requests you have a sleep study they will call you to schedule appointment.

## 2016-09-28 NOTE — Progress Notes (Signed)
Patient ID: [name], female   DOB: 12-Jun-1961, 55 y.o.   MRN: [id number]    Advanced Heart Failure Clinic Note   PCP: Dr Roxy Manns HF Cardiology: Dr. Aundra Dubin PV: Dr. Gwenlyn Found  55 yo with history of PAD, carotid stenosis s/p left CEA, relatively mild CAD, chronic systolic CHF, paroxysmal atrial fibrillation and prior substance abuse presents for CHF clinic followup.  She was admitted in 1/17 with acute hypoxemic respiratory failure in the setting of atrial fibrillation/RVR and volume overload.  She was initially intubated.  She converted back to NSR with amiodarone gtt.  She was treated with IV Lasix, steroids, bronchodilators.  She developed AKI and losartan was stopped.    She was admitted in 3/17 with symptomatic bradycardia.  She was supposed to stop diltiazem after this appointment but continued to take it.  She presented later in 3/17, again with symptomatic bradycardia (junctional bradycardia), hypotension, and AKI.  Diltiazem and bisoprolol were stopped.  HR recovered and is actually elevated today, rate around 100 bpm.  ACEI was stopped with AKI.   She returns for HF follow up. Weight down 4 lbs. Breathing is better.  Has starting using lymphedema pumps (as of this am).  Mostly chair bound. Denies orthopnea or PND. Denies DOE walking around the house, mostly limited due to neuropathy, lymphedema, and neuropathy. Hands and arms are starting are starting to have some neuropathy as well over past few months. Has been watching salt and fluid.  Taking all medications Paramedicine following. Denies bleeding on coumadin. Is now off cigarettes.   Labs (1/17): K 5.2, creatinine 1.4, AST 43, ALT normal Labs (2/17): K 4.2, creatinine 1.3, BNP 607, TSH normal Labs (3/17): K 4.2, creatinine 1.45, AST/ALT normal, HCT 28.7 Labs (4/17): K 4.4, creatinine 1.61 Labs (08/31/2016): K 4.5 Creatinine 1.43   PMH: 1. Carotid stenosis: Known occluded right carotid.  Left CEA in 4/16.  2. CAD: LHC in 12/15 with  80% stenosis in small OM1, nonobstructive disease in other territories.  3. Chronic systolic CHF: Nonischemic cardiomyopathy (?due to ETOH or prior drug abuse).  - Echo (1/17) with EF 45%, mild LV hypertrophy, moderate diastolic dysfunction, inferolateral severe hypokinesis, mildly decreased RV systolic function.  4. Atrial fibrillation: Paroxysmal.   5. Type II diabetes 6. CKD: Stage III. 7. COPD: Quit smoking 1/17. On home oxygen.  8. 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  No longer drinks.  9. Hypothyroidism 10. PAD: Atherectomy SFA in 2014 (Dr Gwenlyn Found).  Peripheral arterial dopplers (2/17) with focal 75-99% proximal right SFA stenosis, occluded mid-distal right SFA, chronic occlusion of all runoff arteries on right. 11. Anemia 12. Prior cocaine abuse.  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   SH: Lives with sister.  Prior cocaine abuse.  Prior ETOH abuse.  Quit smoking in 1/17.  FH: CAD  ROS: All systems reviewed and negative except as per HPI.   Current Outpatient Prescriptions  Medication Sig Dispense Refill  . acetaminophen-codeine (TYLENOL #3) 300-30 MG tablet Take 1 tablet by mouth 2 (two) times daily as needed for moderate pain.    [name] albuterol (PROVENTIL HFA;VENTOLIN HFA) 108 (90 Base) MCG/ACT inhaler Inhale 2 puffs into the lungs every 6 (six) hours as needed for wheezing or shortness of breath. 1 each 3  . albuterol (PROVENTIL) (2.5 MG/3ML) 0.083% nebulizer solution Take 3 mLs (2.5 mg total) by nebulization every 6 (six) hours as needed. For wheezing. 75 mL 2  . amiodarone (PACERONE) 100 MG tablet Take  1 tablet (100 mg total) by mouth daily. 21 tablet 0  . amLODipine (NORVASC) 10 MG tablet Take 10 mg by mouth daily.    [name] atorvastatin (LIPITOR) 40 MG tablet Take 1 tablet (40 mg total) by mouth every evening. 21 tablet 0  . bisoprolol (ZEBETA) 5 MG tablet Take 0.5 tablets (2.5 mg total) by mouth every evening. 11 tablet 0  . Blood Glucose  Monitoring Suppl (ACCU-CHEK AVIVA PLUS) W/DEVICE KIT 1 Device by Does not apply route 4 (four) times daily -  before meals and at bedtime. 1 kit 0  . budesonide-formoterol (SYMBICORT) 160-4.5 MCG/ACT inhaler Inhale 2 puffs into the lungs 2 (two) times daily. 1 Inhaler 12  . cholecalciferol (VITAMIN D) 1000 units tablet Take 1,000 Units by mouth daily.    . cyanocobalamin 1000 MCG tablet Take 1 tablet (1,000 mcg total) by mouth daily. 30 tablet 10  . DULoxetine (CYMBALTA) 30 MG capsule Take 30 mg by mouth daily.    . folic acid (FOLVITE) 1 MG tablet TAKE ONE TABLET BY MOUTH DAILY. 90 tablet 1  . insulin aspart (NOVOLOG FLEXPEN) 100 UNIT/ML FlexPen Inject 24-44 Units into the skin 3 (three) times daily with meals.     . Insulin Glargine (LANTUS SOLOSTAR) 100 UNIT/ML Solostar Pen Inject 64 Units into the skin daily at 10 pm.     . Insulin Pen Needle (ULTICARE MICRO PEN NEEDLES) 32G X 4 MM MISC 1 Syringe by Does not apply route 4 (four) times daily - after meals and at bedtime. 1 each 11  . Lancets (ACCU-CHEK SOFT TOUCH) lancets Use as instructed 100 each 12  . levothyroxine (SYNTHROID, LEVOTHROID) 50 MCG tablet Take 1 tablet (50 mcg total) by mouth daily. 30 tablet 2  . losartan (COZAAR) 25 MG tablet Take 1 tablet (25 mg total) by mouth daily. 21 tablet 0  . metolazone (ZAROXOLYN) 2.5 MG tablet Take 1 tablet (2.5 mg total) by mouth once a week. On Thursday. May take an additional 2.5 mg (1 tablet) as directed by HF clinic. 4 tablet 5  . potassium chloride SA (K-DUR,KLOR-CON) 20 MEQ tablet Take 1.5 tablets (30 mEq total) by mouth daily. Take an additional 1 and 1/2 tablets (30 meq) on Thursday with metolazone 36 tablet 0  . torsemide (DEMADEX) 20 MG tablet Take 4 tablets (80 mg total) by mouth daily. 84 tablet 0  . warfarin (COUMADIN) 5 MG tablet Take 1-1.5 tablets (5-7.5 mg total) by mouth daily. Take 1 tablet (5 mg) daily except 1 & 1/2 tablets (7.5 mg) on Mon, Wed and Fri 30 tablet 0  . nitroGLYCERIN  (NITROSTAT) 0.4 MG SL tablet Place 1 tablet (0.4 mg total) under the tongue every 5 (five) minutes as needed for chest pain. (Patient not taking: Reported on 09/28/2016) 25 tablet 3   No current facility-administered medications for this encounter.    Vitals:   09/28/16 1355  BP: 122/66  Pulse: 71  SpO2: 98%  Weight: 252 lb 6 oz (114.5 kg)     Wt Readings from Last 3 Encounters:  09/28/16 252 lb 6 oz (114.5 kg)  09/07/16 256 lb 6.4 oz (116.3 kg)  08/31/16 266 lb 4 oz (120.8 kg)     General: Obese, NAD, in WC Neck: Thick, JVP difficult, appears 7-8 cm. No thyromegaly or nodule noted.   Lungs: Clear, normal effort CV: Nondisplaced PMI.  Heart regular S1/S2, no S3/S4, no murmur.  No carotid bruit.  Abdomen: Obese, soft, NT, ND, no HSM. No bruits  or masses. +BS  Skin: Intact without lesions or rashes.  Neurologic: Alert and oriented x 3.   Psych: Normal affect. Extremities: No clubbing or cyanosis. Chronic 1+ edema bilaterally to knees.  HEENT: Normal.   EKG: NSR 71 bpm  Assessment/Plan: 1. Chronic systolic CHF: EF 73% on last echo. Has been presumed nonischemic given LHC in 12/15 showing only 80% stenosis in small OM1.NYHA class II-III  - Volume status stable - Continue torsemide 80 mg daily and metolazone 2.5 mg Thursdays with extra 20 meq of potassium.   - Continue bisoprolol 2.5 mg daily. Using instead of Coreg with  COPD and bradycardia previously on previous bradycardia on doses of Coreg.  - Continue losartan 25 mg daily.     BMET today. 2. PAF:  - NSR by EKG. ontinue amiodarone 100 mg daily.    PCP following hypothyroidism (on Levoxyl).   Needs regular eye exams. 3. PAD: Bilateral calf claudication seems quit limiting.  No rest pain or pedal ulcers.  Peripheral arterial dopplers show severe disease on right.  Not candidate for cilostazol with CHF.  Not good candidate for intervention per Dr Gwenlyn Found (seen recently).  4. COPD: On home oxygen.   - Has stopped smoking.  Congratulated.  5. H/o cirrhosis: Likely from ETOH, no longer drinks.  6. Suspected OSA:  - Will refer for sleep study.  7. CKD stage III: - BMET today.  8. HTN:   Stable.   9. Bradycardia: Junctional rhythm when hospitalized in 3/17.   - 71 bpm in NSR per EKG.  Continue low dose bisoprolol as above.  10. Chronic leg wound - Followed at wound clinic in [facility].   Doing well. Labs today. Schedule for sleep study. Follow up with MD 4-6 weeks.      Satira Mccallum Salinda Snedeker PA-C 09/28/2016

## 2016-09-29 NOTE — Addendum Note (Signed)
Encounter addended by: Shirley Friar, PA-C on: 09/29/2016  8:06 PM<BR>    Actions taken: LOS modified

## 2016-09-30 ENCOUNTER — Other Ambulatory Visit: Payer: Self-pay | Admitting: Cardiovascular Disease

## 2016-09-30 ENCOUNTER — Other Ambulatory Visit: Payer: Self-pay | Admitting: Family Medicine

## 2016-10-05 ENCOUNTER — Telehealth (HOSPITAL_COMMUNITY): Payer: Self-pay | Admitting: Cardiology

## 2016-10-05 NOTE — Telephone Encounter (Signed)
Weight increase weight, up x 10lbs in a week B/p elevated- 164/80 Mild LE edema Denies SOB Due  11/23 for metolazone Has not missed any doses   Per Rebecca Eaton Patient can take two doses of metolazone this week 11/22 AND 11/23.( be sure to add additional 30 meq of K) Patient should return call 11/27 with update on weight and symptoms.   [name] aware

## 2016-10-10 ENCOUNTER — Ambulatory Visit (INDEPENDENT_AMBULATORY_CARE_PROVIDER_SITE_OTHER): Payer: Medicare Other | Admitting: Pharmacist Clinician (PhC)/ Clinical Pharmacy Specialist

## 2016-10-10 DIAGNOSIS — I6523 Occlusion and stenosis of bilateral carotid arteries: Secondary | ICD-10-CM

## 2016-10-10 DIAGNOSIS — Z5181 Encounter for therapeutic drug level monitoring: Secondary | ICD-10-CM | POA: Diagnosis not present

## 2016-10-10 DIAGNOSIS — I4891 Unspecified atrial fibrillation: Secondary | ICD-10-CM | POA: Diagnosis not present

## 2016-10-10 DIAGNOSIS — I480 Paroxysmal atrial fibrillation: Secondary | ICD-10-CM | POA: Diagnosis not present

## 2016-10-10 LAB — POCT INR: INR: 3.8

## 2016-10-14 ENCOUNTER — Telehealth (HOSPITAL_COMMUNITY): Payer: Self-pay | Admitting: *Deleted

## 2016-10-14 NOTE — Telephone Encounter (Signed)
Received a call with updates on patient symptoms from [facility] yesterday.  [name] said patient didn't see much of a difference in weight and output after taking her metolazone.  Jonni Sanger advises her to take an extra metolazone with potassium today and come in for lab work next week.  Called patient and left her voicemail with this information and asked her to call us back to schedule a day for lab work next week.  Also advised her to call us if she does not see any results through the weekend.

## 2016-10-18 IMAGING — CR DG CHEST 1V PORT
1 series · 1 of 1 positions shown · non-contrast
Comparison: 01/12/2016

CLINICAL DATA: Bradycardia tonight. Shortness of breath. Dizziness
and weakness.

EXAM:
PORTABLE CHEST 1 VIEW

[AP]
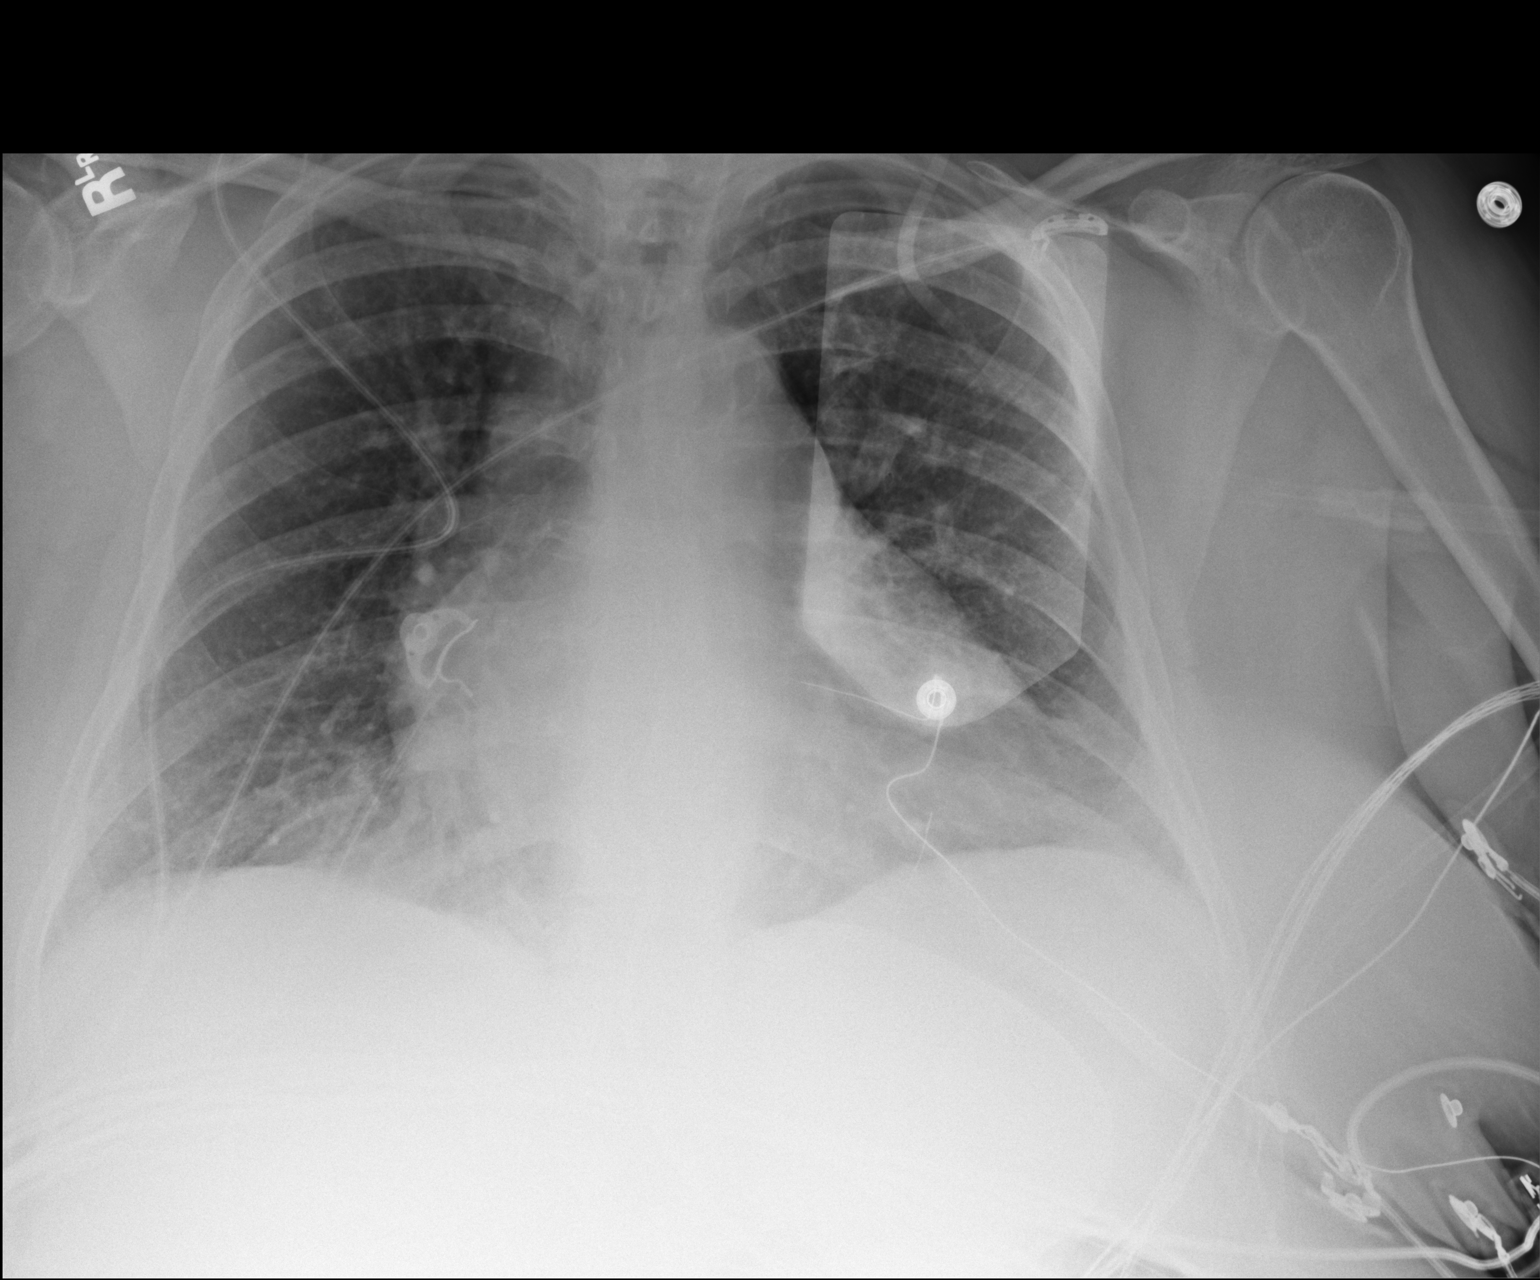

[1 of 1 positions shown; findings below may reference images not displayed]

FINDINGS: Shallow inspiration with atelectasis in the lung bases. Mild cardiac
enlargement. No pulmonary vascular congestion. No focal airspace
disease or consolidation. No blunting of costophrenic angles. No
pneumothorax. Calcification of the aorta.
IMPRESSION: Shallow inspiration with atelectasis in the lung bases. Cardiac
enlargement.

## 2016-10-24 ENCOUNTER — Ambulatory Visit (INDEPENDENT_AMBULATORY_CARE_PROVIDER_SITE_OTHER): Payer: Medicare Other | Admitting: Pharmacist Clinician (PhC)/ Clinical Pharmacy Specialist

## 2016-10-24 DIAGNOSIS — I6523 Occlusion and stenosis of bilateral carotid arteries: Secondary | ICD-10-CM

## 2016-10-24 DIAGNOSIS — I4891 Unspecified atrial fibrillation: Secondary | ICD-10-CM

## 2016-10-24 DIAGNOSIS — Z5181 Encounter for therapeutic drug level monitoring: Secondary | ICD-10-CM | POA: Diagnosis not present

## 2016-10-24 DIAGNOSIS — I480 Paroxysmal atrial fibrillation: Secondary | ICD-10-CM

## 2016-10-24 LAB — POCT INR: INR: 4.3

## 2016-10-27 DIAGNOSIS — L97514 Non-pressure chronic ulcer of other part of right foot with necrosis of bone: Secondary | ICD-10-CM | POA: Insufficient documentation

## 2016-10-31 IMAGING — MG MM SCREENING BREAST TOMO BILATERAL
8 of 12 series · 8 of 28 positions shown · non-contrast
Comparison: Previous exam(s).

CLINICAL DATA: Screening.

EXAM:
2D DIGITAL SCREENING BILATERAL MAMMOGRAM WITH CAD AND ADJUNCT TOMO

[L CC synth-2D]
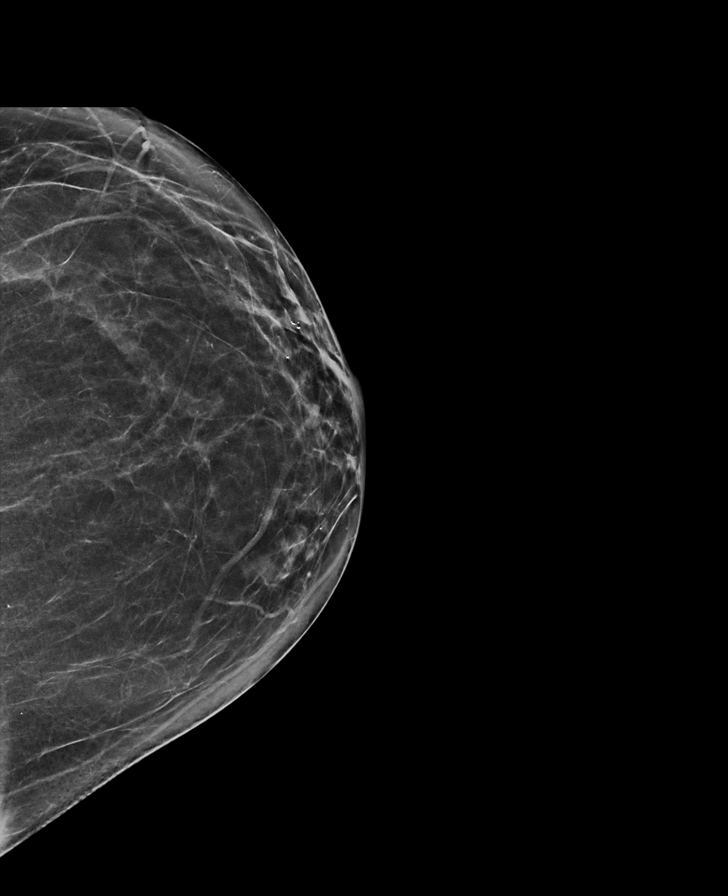

[R MLO synth-2D]
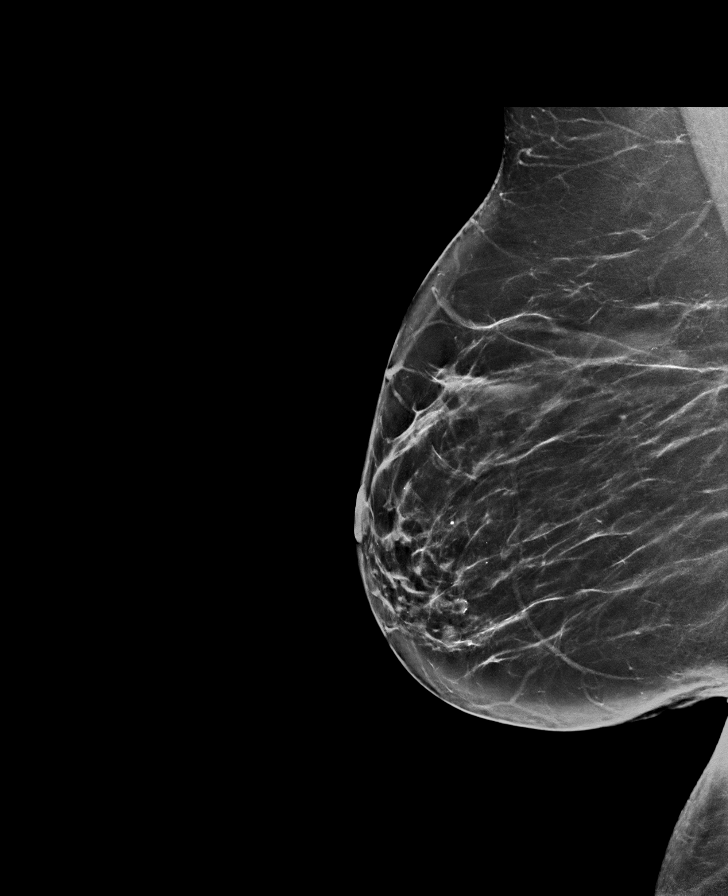

[R MLO]
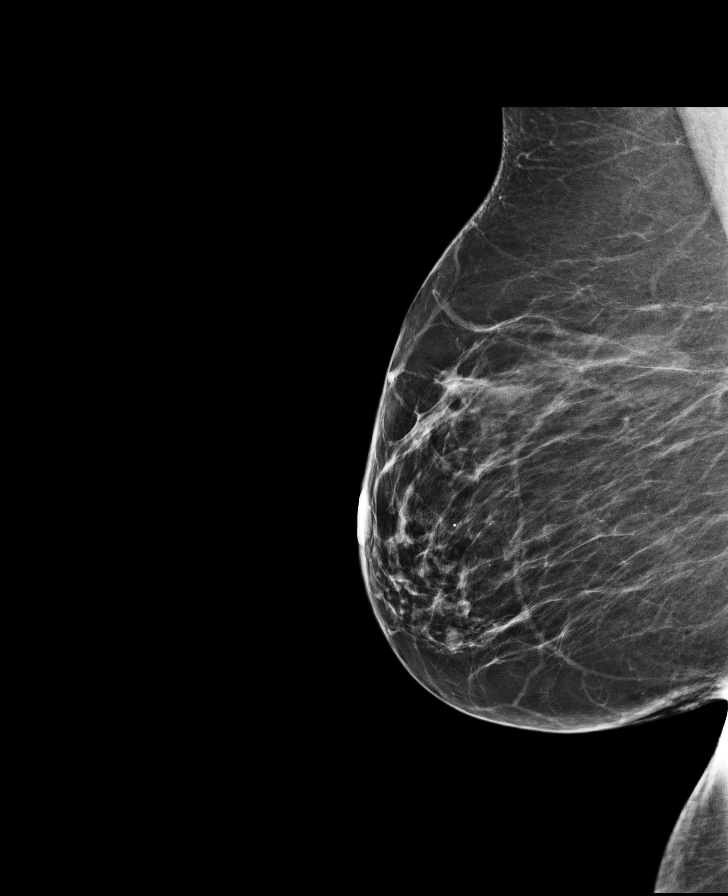

[L MLO synth-2D]
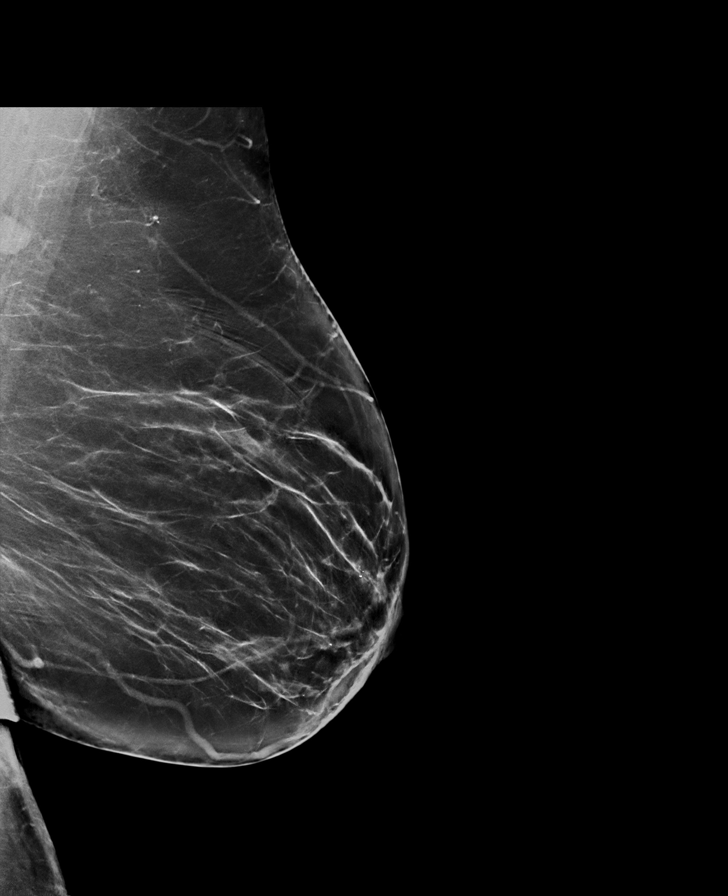

[R CC synth-2D]
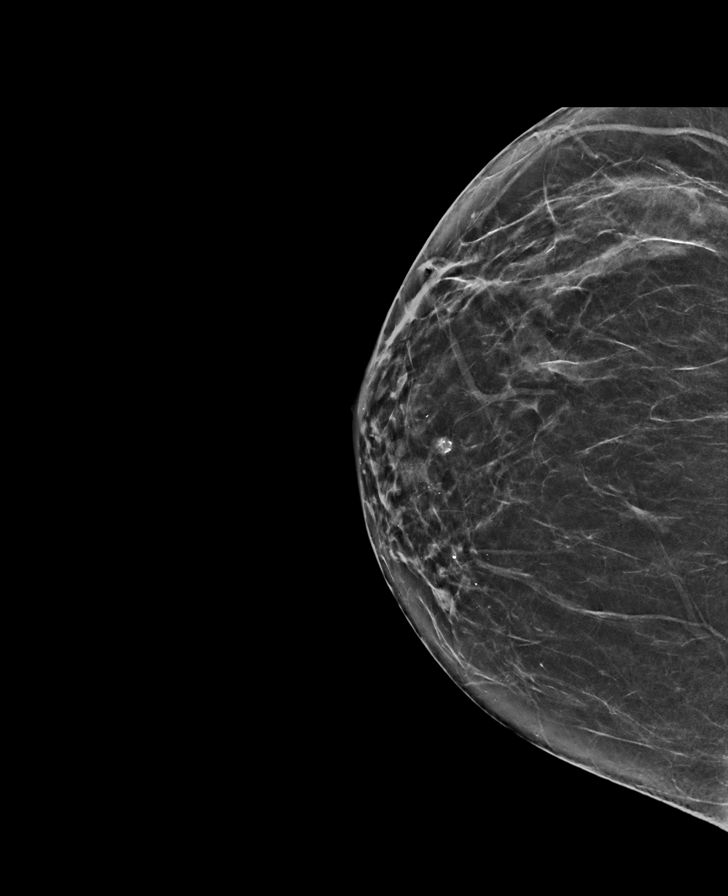

[R CC]
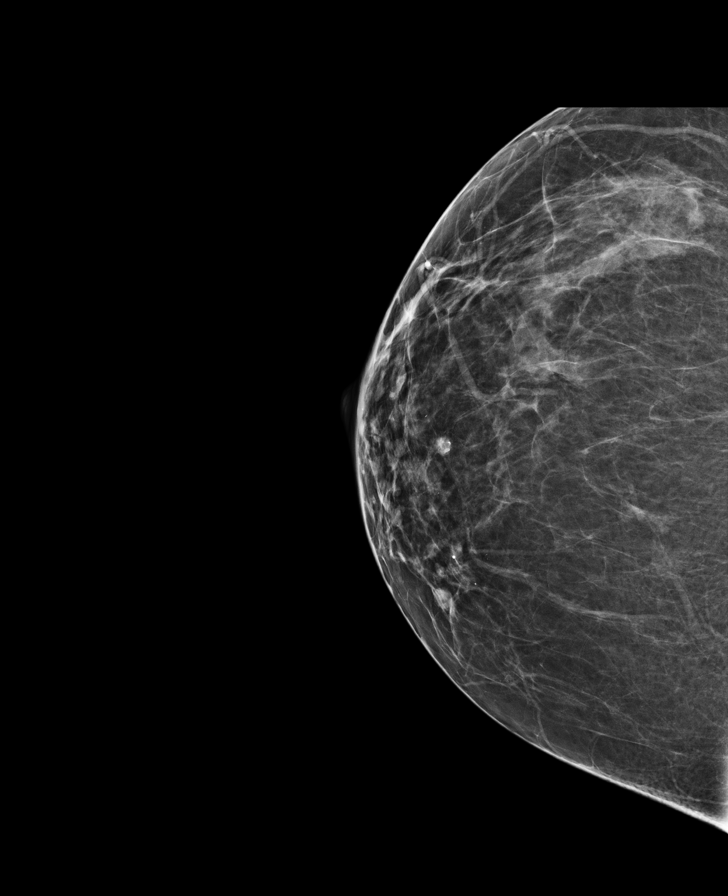

[L CC]
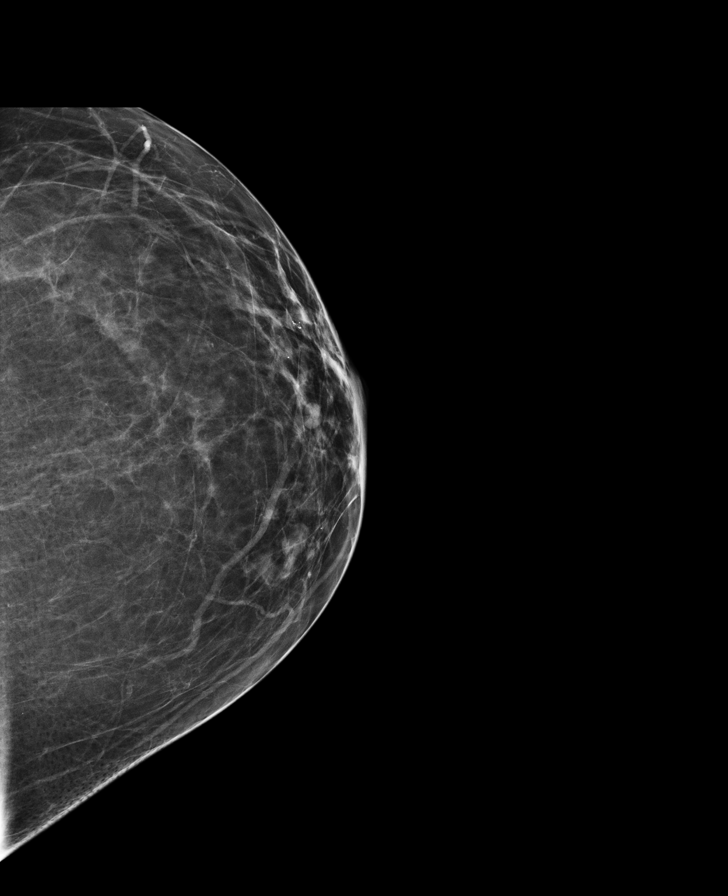

[L MLO]
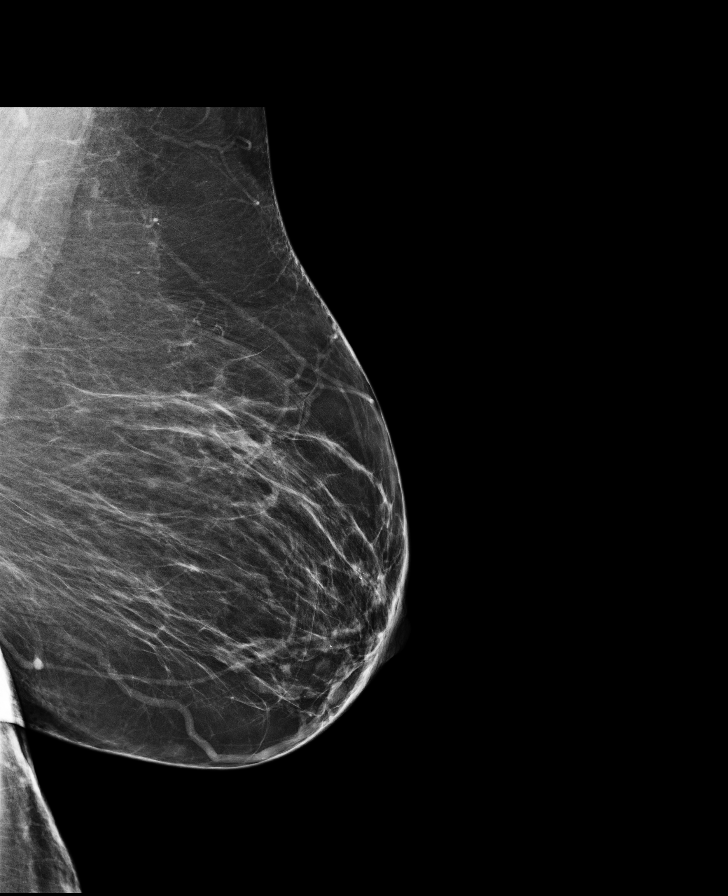

[8 of 28 positions shown; findings below may reference images not displayed]

ACR Breast Density Category b: There are scattered areas of
fibroglandular density.
FINDINGS: There are no findings suspicious for malignancy. Images were
processed with CAD.
IMPRESSION: No mammographic evidence of malignancy. A result letter of this
screening mammogram will be mailed directly to the patient.

RECOMMENDATION:
Screening mammogram in one year. (Code:97-6-RS4)

BI-RADS CATEGORY  1: Negative.

## 2016-11-02 ENCOUNTER — Other Ambulatory Visit (HOSPITAL_COMMUNITY): Payer: Self-pay | Admitting: Student

## 2016-11-02 ENCOUNTER — Other Ambulatory Visit (HOSPITAL_COMMUNITY): Payer: Self-pay | Admitting: Internal Medicine

## 2016-11-02 DIAGNOSIS — I5043 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) and diastolic (congestive) heart failure: Secondary | ICD-10-CM

## 2016-11-04 ENCOUNTER — Ambulatory Visit (INDEPENDENT_AMBULATORY_CARE_PROVIDER_SITE_OTHER): Payer: Medicare Other | Admitting: Pharmacist Clinician (PhC)/ Clinical Pharmacy Specialist

## 2016-11-04 DIAGNOSIS — I4891 Unspecified atrial fibrillation: Secondary | ICD-10-CM | POA: Diagnosis not present

## 2016-11-04 DIAGNOSIS — I6523 Occlusion and stenosis of bilateral carotid arteries: Secondary | ICD-10-CM

## 2016-11-04 DIAGNOSIS — I480 Paroxysmal atrial fibrillation: Secondary | ICD-10-CM

## 2016-11-04 DIAGNOSIS — Z5181 Encounter for therapeutic drug level monitoring: Secondary | ICD-10-CM

## 2016-11-04 LAB — POCT INR: INR: 3

## 2016-11-06 IMAGING — DX DG CHEST 2V
2 series · 2 of 2 positions shown · non-contrast
Comparison: 02/03/2016

CLINICAL DATA: Dyspnea on exertion.  Amiodarone

EXAM:
CHEST  2 VIEW

[chest pa]
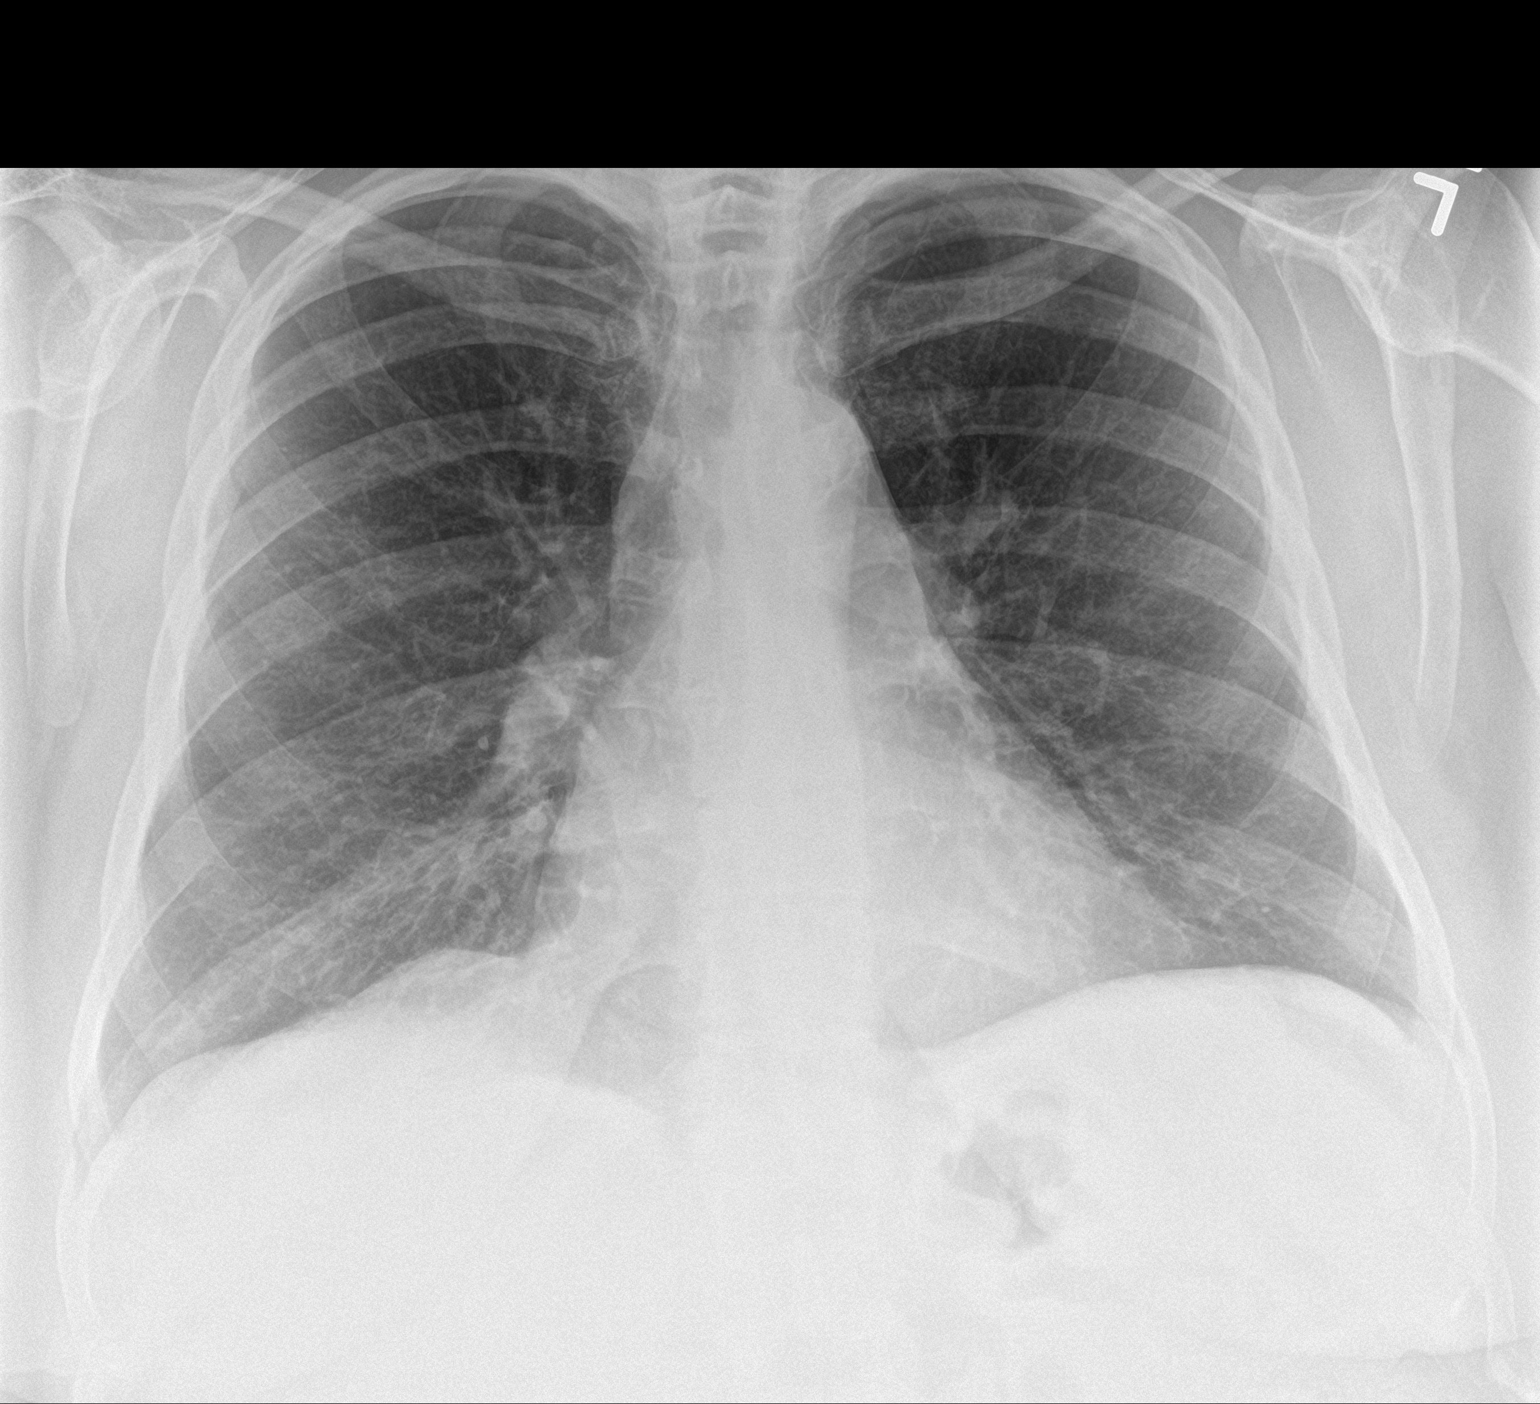

[chest lat]
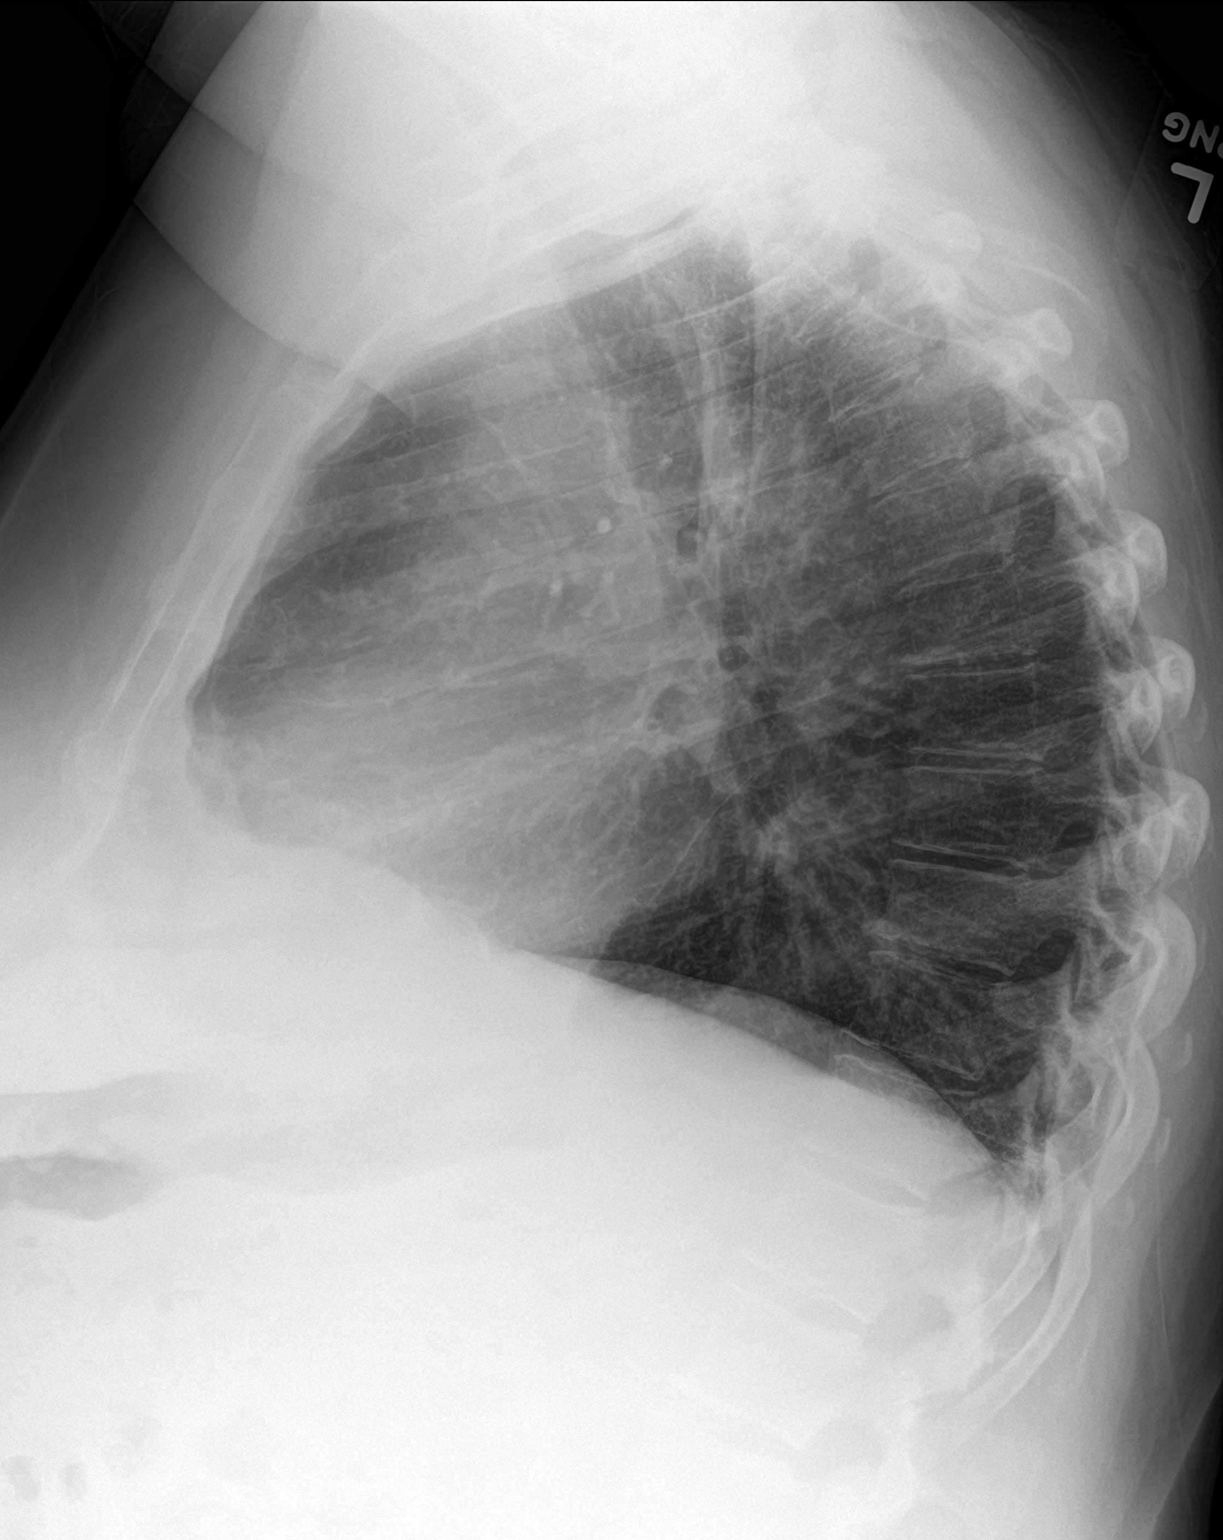

[2 of 2 positions shown; findings below may reference images not displayed]

FINDINGS: The heart size and mediastinal contours are within normal limits.
Both lungs are clear. The visualized skeletal structures are
unremarkable.
IMPRESSION: No active cardiopulmonary disease.

## 2016-11-11 ENCOUNTER — Ambulatory Visit (HOSPITAL_COMMUNITY)
Admission: RE | Admit: 2016-11-11 | Discharge: 2016-11-11 | Disposition: A | Discharge status: Home / Self Care | Payer: Medicare Other | Source: Ambulatory Visit | Attending: Cardiology | Admitting: Cardiology

## 2016-11-11 ENCOUNTER — Encounter (HOSPITAL_COMMUNITY): Payer: Self-pay

## 2016-11-11 VITALS — BP 164/80 | HR 86 | Wt 262.4 lb

## 2016-11-11 DIAGNOSIS — I130 Hypertensive heart and chronic kidney disease with heart failure and stage 1 through stage 4 chronic kidney disease, or unspecified chronic kidney disease: Secondary | ICD-10-CM | POA: Insufficient documentation

## 2016-11-11 DIAGNOSIS — Z9981 Dependence on supplemental oxygen: Secondary | ICD-10-CM | POA: Diagnosis not present

## 2016-11-11 DIAGNOSIS — I10 Essential (primary) hypertension: Secondary | ICD-10-CM

## 2016-11-11 DIAGNOSIS — Z8249 Family history of ischemic heart disease and other diseases of the circulatory system: Secondary | ICD-10-CM | POA: Diagnosis not present

## 2016-11-11 DIAGNOSIS — K7460 Unspecified cirrhosis of liver: Secondary | ICD-10-CM | POA: Diagnosis not present

## 2016-11-11 DIAGNOSIS — I5022 Chronic systolic (congestive) heart failure: Secondary | ICD-10-CM

## 2016-11-11 DIAGNOSIS — Z87891 Personal history of nicotine dependence: Secondary | ICD-10-CM | POA: Diagnosis not present

## 2016-11-11 DIAGNOSIS — Z794 Long term (current) use of insulin: Secondary | ICD-10-CM | POA: Insufficient documentation

## 2016-11-11 DIAGNOSIS — I779 Disorder of arteries and arterioles, unspecified: Secondary | ICD-10-CM | POA: Diagnosis not present

## 2016-11-11 DIAGNOSIS — I480 Paroxysmal atrial fibrillation: Secondary | ICD-10-CM | POA: Insufficient documentation

## 2016-11-11 DIAGNOSIS — R001 Bradycardia, unspecified: Secondary | ICD-10-CM | POA: Insufficient documentation

## 2016-11-11 DIAGNOSIS — Z79899 Other long term (current) drug therapy: Secondary | ICD-10-CM | POA: Diagnosis not present

## 2016-11-11 DIAGNOSIS — J449 Chronic obstructive pulmonary disease, unspecified: Secondary | ICD-10-CM | POA: Insufficient documentation

## 2016-11-11 DIAGNOSIS — N183 Chronic kidney disease, stage 3 (moderate): Secondary | ICD-10-CM | POA: Insufficient documentation

## 2016-11-11 DIAGNOSIS — E039 Hypothyroidism, unspecified: Secondary | ICD-10-CM | POA: Insufficient documentation

## 2016-11-11 DIAGNOSIS — I739 Peripheral vascular disease, unspecified: Secondary | ICD-10-CM

## 2016-11-11 DIAGNOSIS — I2510 Atherosclerotic heart disease of native coronary artery without angina pectoris: Secondary | ICD-10-CM | POA: Insufficient documentation

## 2016-11-11 DIAGNOSIS — E1122 Type 2 diabetes mellitus with diabetic chronic kidney disease: Secondary | ICD-10-CM | POA: Insufficient documentation

## 2016-11-11 DIAGNOSIS — Z7901 Long term (current) use of anticoagulants: Secondary | ICD-10-CM | POA: Diagnosis not present

## 2016-11-11 DIAGNOSIS — E1151 Type 2 diabetes mellitus with diabetic peripheral angiopathy without gangrene: Secondary | ICD-10-CM | POA: Insufficient documentation

## 2016-11-11 LAB — CBC
HCT: 36.9 % (ref 36.0–46.0)
Hemoglobin: 12.1 g/dL (ref 12.0–15.0)
MCH: 29.8 pg (ref 26.0–34.0)
MCHC: 32.8 g/dL (ref 30.0–36.0)
MCV: 90.9 fL (ref 78.0–100.0)
PLATELETS: 281 10*3/uL (ref 150–400)
RBC: 4.06 MIL/uL (ref 3.87–5.11)
RDW: 14.5 % (ref 11.5–15.5)
WBC: 11.1 10*3/uL — AB (ref 4.0–10.5)

## 2016-11-11 LAB — COMPREHENSIVE METABOLIC PANEL
ALBUMIN: 3.4 g/dL — AB (ref 3.5–5.0)
ALT: 40 U/L (ref 14–54)
AST: 32 U/L (ref 15–41)
Alkaline Phosphatase: 126 U/L (ref 38–126)
Anion gap: 11 (ref 5–15)
BILIRUBIN TOTAL: 0.3 mg/dL (ref 0.3–1.2)
BUN: 20 mg/dL (ref 6–20)
CALCIUM: 9.2 mg/dL (ref 8.9–10.3)
CHLORIDE: 100 mmol/L — AB (ref 101–111)
CO2: 25 mmol/L (ref 22–32)
CREATININE: 1.1 mg/dL — AB (ref 0.44–1.00)
GFR calc Af Amer: >60 mL/min (ref >60)
GFR calc non Af Amer: 56 mL/min — ABNORMAL LOW (ref >60)
Glucose, Bld: 171 mg/dL — ABNORMAL HIGH (ref 65–99)
POTASSIUM: 3.9 mmol/L (ref 3.5–5.1)
Sodium: 136 mmol/L (ref 135–145)
Total Protein: 7.2 g/dL (ref 6.5–8.1)

## 2016-11-11 LAB — TSH: TSH: 3.486 u[IU]/mL (ref 0.350–4.500)

## 2016-11-11 LAB — BRAIN NATRIURETIC PEPTIDE: B NATRIURETIC PEPTIDE 5: 163.1 pg/mL — AB (ref 0.0–100.0)

## 2016-11-11 MED ORDER — TORSEMIDE 20 MG PO TABS: 40.0000 mg | ORAL_TABLET | ORAL | 2 refills | Status: DC

## 2016-11-11 MED ORDER — SPIRONOLACTONE 25 MG PO TABS: 12.5000 mg | ORAL_TABLET | Freq: Every day | ORAL | 6 refills | Status: DC

## 2016-11-11 MED ORDER — SPIRONOLACTONE 25 MG PO TABS: 25.0000 mg | ORAL_TABLET | Freq: Every day | ORAL | 6 refills | Status: DC

## 2016-11-11 NOTE — Progress Notes (Signed)
Patient ID: [name], female   DOB: 1961-05-09, 55 y.o.   MRN: [id number]    Advanced Heart Failure Clinic Note   PCP: Dr Roxy Manns HF Cardiology: Dr. Aundra Dubin PV: Dr. Gwenlyn Found  55 yo with history of PAD, carotid stenosis s/p left CEA, relatively mild CAD, chronic systolic CHF, paroxysmal atrial fibrillation and prior substance abuse presents for CHF clinic followup.  She was admitted in 1/17 with acute hypoxemic respiratory failure in the setting of atrial fibrillation/RVR and volume overload.  She was initially intubated.  She converted back to NSR with amiodarone gtt.  She was treated with IV Lasix, steroids, bronchodilators.  She developed AKI and losartan was stopped.    She was admitted in 3/17 with symptomatic bradycardia.  She was supposed to stop diltiazem after this admission but continued to take it.  She presented later in 3/17, again with symptomatic bradycardia (junctional bradycardia), hypotension, and AKI.  Diltiazem and bisoprolol were stopped.  HR recovered and she has had no problems since.  ACEI was stopped with AKI.   She returns today for regular followup. Weight up 10 lbs from last visit 6 weeks ago.  Overall she feels good.  Using lymphedema pumps and feels like it's helping.  Trying to cut back on fluid and watching portion size. Denies orthopnea or PND. Can walk for about 3 minutes before she gets SOB, but mostly limited due to leg pains. Taking all medications as directed.  Taking metolazone every Thursday. Doesn't see much of a difference on these days. Paramedicine continues to follow. She remains abstinent from ETOH and tobacco.    Labs (1/17): K 5.2, creatinine 1.4, AST 43, ALT normal Labs (2/17): K 4.2, creatinine 1.3, BNP 607, TSH normal Labs (3/17): K 4.2, creatinine 1.45, AST/ALT normal, HCT 28.7 Labs (4/17): K 4.4, creatinine 1.61 Labs (08/31/2016): K 4.5 Creatinine 1.43  Labs (11/17): K 4.2, creatinine 1.4  ECG: NSR, QTc prolonged at 496  PMH: 1. Carotid  stenosis: Known occluded right carotid.  Left CEA in 4/16.  2. CAD: LHC in 12/15 with 80% stenosis in small OM1, nonobstructive disease in other territories.  3. Chronic systolic CHF: Nonischemic cardiomyopathy (?due to ETOH or prior drug abuse).  - Echo (1/17) with EF 45%, mild LV hypertrophy, moderate diastolic dysfunction, inferolateral severe hypokinesis, mildly decreased RV systolic function.  4. Atrial fibrillation: Paroxysmal.   5. Type II diabetes 6. CKD: Stage III. 7. COPD: Quit smoking 1/17. On home oxygen.  8. 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  No longer drinks.  9. Hypothyroidism 10. PAD: Atherectomy SFA in 2014 (Dr Gwenlyn Found).  Peripheral arterial dopplers (2/17) with focal 75-99% proximal right SFA stenosis, occluded mid-distal right SFA, chronic occlusion of all runoff arteries on right. 11. Anemia 12. Prior cocaine abuse.  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   SH: Lives with sister.  Prior cocaine abuse.  Prior ETOH abuse.  Quit smoking in 1/17.  FH: CAD  ROS: All systems reviewed and negative except as per HPI.   Current Outpatient Prescriptions  Medication Sig Dispense Refill  . acetaminophen-codeine (TYLENOL #3) 300-30 MG tablet Take 1 tablet by mouth 2 (two) times daily as needed for moderate pain.    [name] albuterol (PROVENTIL HFA;VENTOLIN HFA) 108 (90 Base) MCG/ACT inhaler Inhale 2 puffs into the lungs every 6 (six) hours as needed for wheezing or shortness of breath. 1 each 3  . albuterol (PROVENTIL) (2.5 MG/3ML) 0.083% nebulizer solution Take 3 mLs (2.5 mg  total) by nebulization every 6 (six) hours as needed. For wheezing. 75 mL 2  . amiodarone (PACERONE) 100 MG tablet Take 1 tablet (100 mg total) by mouth daily. 21 tablet 0  . amLODipine (NORVASC) 10 MG tablet Take 10 mg by mouth daily.    [name] atorvastatin (LIPITOR) 40 MG tablet Take 1 tablet (40 mg total) by mouth every evening. 21 tablet 0  . bisoprolol (ZEBETA) 5 MG tablet Take 0.5  tablets (2.5 mg total) by mouth every evening. 11 tablet 0  . Blood Glucose Monitoring Suppl (ACCU-CHEK AVIVA PLUS) W/DEVICE KIT 1 Device by Does not apply route 4 (four) times daily -  before meals and at bedtime. 1 kit 0  . cholecalciferol (VITAMIN D) 1000 units tablet Take 1,000 Units by mouth daily.    . cyanocobalamin 1000 MCG tablet Take 1 tablet (1,000 mcg total) by mouth daily. 30 tablet 10  . DULoxetine (CYMBALTA) 30 MG capsule Take 30 mg by mouth daily.    . folic acid (FOLVITE) 1 MG tablet TAKE ONE TABLET BY MOUTH DAILY. 90 tablet 1  . insulin aspart (NOVOLOG FLEXPEN) 100 UNIT/ML FlexPen Inject 24-44 Units into the skin 3 (three) times daily with meals.     . Insulin Glargine (LANTUS SOLOSTAR) 100 UNIT/ML Solostar Pen Inject 64 Units into the skin daily at 10 pm.     . Insulin Pen Needle (ULTICARE MICRO PEN NEEDLES) 32G X 4 MM MISC 1 Syringe by Does not apply route 4 (four) times daily - after meals and at bedtime. 1 each 11  . Lancets (ACCU-CHEK SOFT TOUCH) lancets Use as instructed 100 each 12  . levothyroxine (SYNTHROID, LEVOTHROID) 50 MCG tablet Take 1 tablet (50 mcg total) by mouth daily. 30 tablet 2  . losartan (COZAAR) 25 MG tablet Take 1 tablet (25 mg total) by mouth daily. 21 tablet 0  . metolazone (ZAROXOLYN) 2.5 MG tablet Take 1 tablet (2.5 mg total) by mouth once a week. On Thursday. May take an additional 2.5 mg (1 tablet) as directed by HF clinic. 4 tablet 5  . nitroGLYCERIN (NITROSTAT) 0.4 MG SL tablet Place 1 tablet (0.4 mg total) under the tongue every 5 (five) minutes as needed for chest pain. 25 tablet 3  . potassium chloride SA (K-DUR,KLOR-CON) 20 MEQ tablet Take 1.5 tablets (30 mEq total) by mouth daily. Take an additional 1 and 1/2 tablets (30 meq) on Thursday with metolazone 36 tablet 0  . SYMBICORT 160-4.5 MCG/ACT inhaler INHALE 2 PUFFS INTO THE LUNGS TWICE DAILY 1 Inhaler 11  . torsemide (DEMADEX) 20 MG tablet Take 4 tablets (80 mg total) by mouth daily. 84 tablet  0  . torsemide (DEMADEX) 20 MG tablet TAKE 4 TABLETS BY MOUTH EVERY DAY AND TAKE AN EXTRA 4 TABLETS AS DIRECTED 180 tablet 2  . warfarin (COUMADIN) 5 MG tablet Take 1-1.5 tablets (5-7.5 mg total) by mouth daily. Take 1 tablet (5 mg) daily except 1 & 1/2 tablets (7.5 mg) on Mon, Wed and Fri 30 tablet 0  . warfarin (COUMADIN) 5 MG tablet TAKE 1 TABLET TO 1 AND 1/2 TABLETS BY MOUTH EVERY DAY AS DIRECTED BY COUMADIN CLINIC 45 tablet 2   No current facility-administered medications for this encounter.    Vitals:   11/11/16 1123  BP: (!) 164/80  Pulse: 86  SpO2: 95%  Weight: 262 lb 6.4 oz (119 kg)     Wt Readings from Last 3 Encounters:  11/11/16 262 lb 6.4 oz (119 kg)  09/28/16  252 lb 6 oz (114.5 kg)  09/07/16 256 lb 6.4 oz (116.3 kg)     General: Obese, NAD, in WC Neck: Thick, JVP difficult, appears ~ 10-12 cm, No thyromegaly or nodule noted.   Lungs: CTAB, normal effort.  CV: Nondisplaced PMI.  Heart regular S1/S2, no S3/S4, no murmur.  No carotid bruit.  Unable to palpate pedal pulses.  Abdomen: Obese, soft, NT, ND, no HSM. No bruits or masses. +BS  Skin: Intact without lesions or rashes.  Neurologic: Alert and oriented x 3.   Psych: Normal affect. Extremities: No clubbing or cyanosis. Chronic 1+ edema bilaterally into thighs. Unna boots in place.  HEENT: Normal.   Assessment/Plan: 1. Chronic systolic CHF: EF 07% on last echo. Has been presumed nonischemic given LHC in 12/15 showing only 80% stenosis in small OM1.  NYHA class II-III.  Volume status elevated on exam and weight is up.  Took metolazone yesterday.  - Increase torsemide to 80 mg qam and 40 mg qpm.  BMET today and repeat in 10 days.  - Continue metolazone 2.5 mg Thursdays with extra 20 meq of potassium.   - Continue bisoprolol 2.5 mg daily. Has been intolerant of coreg.  - Continue losartan 25 mg daily.     - Add spironolactone 12.5 mg daily.  - Will order repeat echo.  2. Atrial fibrillation: Paroxysmal.  NSR today.    - Continue amiodarone 100 mg daily. Check LFTs. PCP following hypothyroidism (on Levoxyl).   Needs regular eye exams. - On coumadin. CBC today.  3. PAD: Bilateral calf claudication seems limiting.  No rest pain or pedal ulcers.  Peripheral arterial dopplers show severe disease on right.  Not candidate for cilostazol with CHF.  Not good candidate for intervention per Dr Gwenlyn Found (saw earlier this year).  4. COPD: On home oxygen.  Has stopped smoking.  5. H/o cirrhosis: Likely from ETOH, no longer drinks.  6. Suspected OSA:  Sleep study scheduled for January 9th.  7. CKD stage III:  BMET/BNP today.  8. HTN:  Elevated today. Took morning medications just prior to visit.  - Adding spironolactone 12.5 mg daily. 9. Bradycardia: Junctional rhythm when hospitalized in 3/17.   - Stable. Continue low dose bisoprolol. 10. Chronic leg wound: Followed at wound clinic in [name].   Followup in 2 wks.   Loralie Champagne 11/12/2016

## 2016-11-11 NOTE — Patient Instructions (Addendum)
START Spironolacotne 12.5 mg, one half tab daily INCREASE Torsemide to 80 mg (4 tabs) in the AM and 40 mg (2 tabs) in the PM  Your physician has requested that you have an echocardiogram. Echocardiography is a painless test that uses sound waves to create images of your heart. It provides your doctor with information about the size and shape of your heart and how well your heart's chambers and valves are working. This procedure takes approximately one hour. There are no restrictions for this procedure.    Your physician recommends that you schedule a follow-up appointment in: 2 weeks with Rebecca Eaton  Do the following things EVERYDAY: 1) Weigh yourself in the morning before breakfast. Write it down and keep it in a log. 2) Take your medicines as prescribed 3) Eat low salt foods-Limit salt (sodium) to 2000 mg per day.  4) Stay as active as you can everyday 5) Limit all fluids for the day to less than 2 liters

## 2016-11-17 ENCOUNTER — Telehealth (HOSPITAL_COMMUNITY): Payer: Self-pay | Admitting: *Deleted

## 2016-11-17 ENCOUNTER — Ambulatory Visit (HOSPITAL_COMMUNITY)
Admission: RE | Admit: 2016-11-17 | Discharge: 2016-11-17 | Disposition: A | Discharge status: Home / Self Care | Payer: Medicare Other | Source: Ambulatory Visit | Attending: Cardiology | Admitting: Cardiology

## 2016-11-17 DIAGNOSIS — I5022 Chronic systolic (congestive) heart failure: Secondary | ICD-10-CM | POA: Insufficient documentation

## 2016-11-17 LAB — ECHOCARDIOGRAM COMPLETE
EERAT: 10.62
EWDT: 197 ms
FS: 12 % — AB (ref 28–44)
IV/PV OW: 0.77
LA diam end sys: 44 mm
LA diam index: 2 cm/m2
LA vol A4C: 76.9 ml
LA vol index: 35.2 mL/m2
LA vol: 77.5 mL
LASIZE: 44 mm
LV E/e' medial: 10.62
LV E/e'average: 10.62
LV PW d: 16 mm — AB (ref 0.6–1.1)
LV TDI E'LATERAL: 7.72
LV TDI E'MEDIAL: 6.96
LV e' LATERAL: 7.72 cm/s
LVOT area: 2.84 cm2
LVOT diameter: 19 mm
MV Dec: 197
MV pk A vel: 71.8 m/s
MVPG: 3 mmHg
MVPKEVEL: 82 m/s

## 2016-11-17 MED ORDER — SPIRONOLACTONE 25 MG PO TABS: 12.5000 mg | ORAL_TABLET | Freq: Every day | ORAL | 6 refills | Status: DC

## 2016-11-17 NOTE — Progress Notes (Signed)
  Echocardiogram 2D Echocardiogram has been performed.  Berneice Zettlemoyer 11/17/2016, 4:09 PM

## 2016-11-17 NOTE — Telephone Encounter (Signed)
[name] with [facility] called and while she was visiting patient, patient said that her prescription for [name] was never called in.    Med refill sent to [facility] last week, I resent refill to requested [facility].

## 2016-11-22 ENCOUNTER — Ambulatory Visit (HOSPITAL_BASED_OUTPATIENT_CLINIC_OR_DEPARTMENT_OTHER): Payer: Medicare Other | Attending: Student | Admitting: Cardiology

## 2016-11-22 VITALS — Ht 64.0 in | Wt 260.0 lb

## 2016-11-22 DIAGNOSIS — E669 Obesity, unspecified: Secondary | ICD-10-CM | POA: Diagnosis present

## 2016-11-22 DIAGNOSIS — R0609 Other forms of dyspnea: Secondary | ICD-10-CM | POA: Diagnosis not present

## 2016-11-22 DIAGNOSIS — G4733 Obstructive sleep apnea (adult) (pediatric): Secondary | ICD-10-CM | POA: Diagnosis not present

## 2016-11-22 DIAGNOSIS — R0989 Other specified symptoms and signs involving the circulatory and respiratory systems: Secondary | ICD-10-CM

## 2016-11-22 DIAGNOSIS — R0683 Snoring: Secondary | ICD-10-CM | POA: Diagnosis not present

## 2016-11-22 DIAGNOSIS — G4734 Idiopathic sleep related nonobstructive alveolar hypoventilation: Secondary | ICD-10-CM | POA: Diagnosis not present

## 2016-11-22 DIAGNOSIS — G4736 Sleep related hypoventilation in conditions classified elsewhere: Secondary | ICD-10-CM | POA: Diagnosis not present

## 2016-11-24 ENCOUNTER — Ambulatory Visit (HOSPITAL_COMMUNITY)
Admission: RE | Admit: 2016-11-24 | Discharge: 2016-11-24 | Disposition: A | Discharge status: Home / Self Care | Payer: Medicare Other | Source: Ambulatory Visit | Attending: Cardiology | Admitting: Cardiology

## 2016-11-24 VITALS — BP 130/70 | HR 90 | Wt 259.0 lb

## 2016-11-24 DIAGNOSIS — E1122 Type 2 diabetes mellitus with diabetic chronic kidney disease: Secondary | ICD-10-CM | POA: Insufficient documentation

## 2016-11-24 DIAGNOSIS — R001 Bradycardia, unspecified: Secondary | ICD-10-CM | POA: Diagnosis not present

## 2016-11-24 DIAGNOSIS — X58XXXD Exposure to other specified factors, subsequent encounter: Secondary | ICD-10-CM | POA: Diagnosis not present

## 2016-11-24 DIAGNOSIS — Z7901 Long term (current) use of anticoagulants: Secondary | ICD-10-CM | POA: Insufficient documentation

## 2016-11-24 DIAGNOSIS — I10 Essential (primary) hypertension: Secondary | ICD-10-CM | POA: Diagnosis not present

## 2016-11-24 DIAGNOSIS — S81809D Unspecified open wound, unspecified lower leg, subsequent encounter: Secondary | ICD-10-CM | POA: Diagnosis not present

## 2016-11-24 DIAGNOSIS — I5022 Chronic systolic (congestive) heart failure: Secondary | ICD-10-CM | POA: Diagnosis not present

## 2016-11-24 DIAGNOSIS — Z79899 Other long term (current) drug therapy: Secondary | ICD-10-CM | POA: Insufficient documentation

## 2016-11-24 DIAGNOSIS — Z794 Long term (current) use of insulin: Secondary | ICD-10-CM | POA: Diagnosis not present

## 2016-11-24 DIAGNOSIS — G4730 Sleep apnea, unspecified: Secondary | ICD-10-CM

## 2016-11-24 DIAGNOSIS — Z87891 Personal history of nicotine dependence: Secondary | ICD-10-CM | POA: Insufficient documentation

## 2016-11-24 DIAGNOSIS — E039 Hypothyroidism, unspecified: Secondary | ICD-10-CM | POA: Diagnosis not present

## 2016-11-24 DIAGNOSIS — K7460 Unspecified cirrhosis of liver: Secondary | ICD-10-CM | POA: Insufficient documentation

## 2016-11-24 DIAGNOSIS — R29818 Other symptoms and signs involving the nervous system: Secondary | ICD-10-CM | POA: Insufficient documentation

## 2016-11-24 DIAGNOSIS — I480 Paroxysmal atrial fibrillation: Secondary | ICD-10-CM | POA: Insufficient documentation

## 2016-11-24 DIAGNOSIS — I130 Hypertensive heart and chronic kidney disease with heart failure and stage 1 through stage 4 chronic kidney disease, or unspecified chronic kidney disease: Secondary | ICD-10-CM | POA: Insufficient documentation

## 2016-11-24 DIAGNOSIS — Z9981 Dependence on supplemental oxygen: Secondary | ICD-10-CM | POA: Diagnosis not present

## 2016-11-24 DIAGNOSIS — N183 Chronic kidney disease, stage 3 unspecified: Secondary | ICD-10-CM | POA: Insufficient documentation

## 2016-11-24 DIAGNOSIS — E1151 Type 2 diabetes mellitus with diabetic peripheral angiopathy without gangrene: Secondary | ICD-10-CM | POA: Diagnosis not present

## 2016-11-24 DIAGNOSIS — Z8249 Family history of ischemic heart disease and other diseases of the circulatory system: Secondary | ICD-10-CM | POA: Insufficient documentation

## 2016-11-24 DIAGNOSIS — I2510 Atherosclerotic heart disease of native coronary artery without angina pectoris: Secondary | ICD-10-CM | POA: Diagnosis not present

## 2016-11-24 DIAGNOSIS — J449 Chronic obstructive pulmonary disease, unspecified: Secondary | ICD-10-CM | POA: Diagnosis not present

## 2016-11-24 LAB — BASIC METABOLIC PANEL
Anion gap: 11 (ref 5–15)
BUN: 32 mg/dL — AB (ref 6–20)
CHLORIDE: 97 mmol/L — AB (ref 101–111)
CO2: 28 mmol/L (ref 22–32)
CREATININE: 1.73 mg/dL — AB (ref 0.44–1.00)
Calcium: 8.9 mg/dL (ref 8.9–10.3)
GFR calc Af Amer: 37 mL/min — ABNORMAL LOW (ref >60)
GFR calc non Af Amer: 32 mL/min — ABNORMAL LOW (ref >60)
GLUCOSE: 214 mg/dL — AB (ref 65–99)
Potassium: 4.8 mmol/L (ref 3.5–5.1)
Sodium: 136 mmol/L (ref 135–145)

## 2016-11-24 LAB — BRAIN NATRIURETIC PEPTIDE: B Natriuretic Peptide: 73.5 pg/mL (ref 0.0–100.0)

## 2016-11-24 MED ORDER — SPIRONOLACTONE 25 MG PO TABS: 25.0000 mg | ORAL_TABLET | Freq: Every day | ORAL | 6 refills | Status: DC

## 2016-11-24 NOTE — Progress Notes (Signed)
  SW Intern met with paramedicine pt and provided a scale for daily weighing's. Pt indicated she didn't need anything else at this time. CSW will continue to provide assistance and Paramedicine support as needed. SW Intern, [place], [place] [phone]

## 2016-11-24 NOTE — Progress Notes (Signed)
Advanced Heart Failure Medication Review by a Pharmacist  Does the patient  feel that his/her medications are working for him/her?  yes  Has the patient been experiencing any side effects to the medications prescribed?  no  Does the patient measure his/her own blood pressure or blood glucose at home?  yes   Does the patient have any problems obtaining medications due to transportation or finances?   no  Understanding of regimen: good Understanding of indications: good Potential of compliance: good Patient understands to avoid NSAIDs. Patient understands to avoid decongestants.  Issues to address at subsequent visits: None   Pharmacist comments:  Ms. Zollars is a pleasant 56 yo F presenting with [name] (paramedicine) and her medication bottles. She reports great compliance with her regimen and did not have any specific medication-related questions or concerns for me at this time.   [name]. [name], PharmD, BCPS, CPP Clinical Pharmacist Pager: [phone] Phone: [phone] 11/24/2016 2:50 PM      Time with patient: 10 minutes Preparation and documentation time: 2 minutes Total time: 12 minutes

## 2016-11-24 NOTE — Patient Instructions (Signed)
INCREASE Spironolactone to 25 mg (1 whole tablet) once daily.  Routine lab work today. Will notify you of abnormal results, otherwise no news is good news!  Repeat labs in 10-14 days.  Follow up 4 weeks with Oda Kilts PA-C.  Do the following things EVERYDAY: 1) Weigh yourself in the morning before breakfast. Write it down and keep it in a log. 2) Take your medicines as prescribed 3) Eat low salt foods-Limit salt (sodium) to 2000 mg per day.  4) Stay as active as you can everyday 5) Limit all fluids for the day to less than 2 liters

## 2016-11-24 NOTE — Progress Notes (Signed)
Patient ID: [name], female   DOB: 1961/11/01, 56 y.o.   MRN: [id number]    Advanced Heart Failure Clinic Note   PCP: Dr Roxy Manns HF Cardiology: Dr. Aundra Dubin PV: Dr. Gwenlyn Found  56 yo with history of PAD, carotid stenosis s/p left CEA, relatively mild CAD, chronic systolic CHF, paroxysmal atrial fibrillation and prior substance abuse presents for CHF clinic followup.  She was admitted in 1/17 with acute hypoxemic respiratory failure in the setting of atrial fibrillation/RVR and volume overload.  She was initially intubated.  She converted back to NSR with amiodarone gtt.  She was treated with IV Lasix, steroids, bronchodilators.  She developed AKI and losartan was stopped.    She was admitted in 3/17 with symptomatic bradycardia.  She was supposed to stop diltiazem after this admission but continued to take it.  She presented later in 3/17, again with symptomatic bradycardia (junctional bradycardia), hypotension, and AKI.  Diltiazem and bisoprolol were stopped.  HR recovered and she has had no problems since.  ACEI was stopped with AKI.   She presents today for follow up. Last visit torsemide increased. Only started spironolactone a few days ago. Feels like she has better urine out. Hasn't been able to weigh at home as her scale broke. Had frozen baked fish last for dinner (non-breaded). Has been avoiding fast food.  Drinking over 2 L still. Lymphedema pumps help her swelling a little. Takes metolazone on thursdays. Breathing has been "OK". Can walk a few minutes but still mostly limited by leg pains.   Echo 11/17/16  LVEF 40-45%, Grade 2 DD, Moderate LAE, RV normal  Labs (1/17): K 5.2, creatinine 1.4, AST 43, ALT normal Labs (2/17): K 4.2, creatinine 1.3, BNP 607, TSH normal Labs (3/17): K 4.2, creatinine 1.45, AST/ALT normal, HCT 28.7 Labs (4/17): K 4.4, creatinine 1.61 Labs (08/31/2016): K 4.5 Creatinine 1.43  Labs (11/17): K 4.2, creatinine 1.4  PMH: 1. Carotid stenosis: Known occluded right  carotid.  Left CEA in 4/16.  2. CAD: LHC in 12/15 with 80% stenosis in small OM1, nonobstructive disease in other territories.  3. Chronic systolic CHF: Nonischemic cardiomyopathy (?due to ETOH or prior drug abuse).  - Echo (1/17) with EF 45%, mild LV hypertrophy, moderate diastolic dysfunction, inferolateral severe hypokinesis, mildly decreased RV systolic function.  4. Atrial fibrillation: Paroxysmal.   5. Type II diabetes 6. CKD: Stage III. 7. COPD: Quit smoking 1/17. On home oxygen.  8. 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  No longer drinks.  9. Hypothyroidism 10. PAD: Atherectomy SFA in 2014 (Dr Gwenlyn Found).  Peripheral arterial dopplers (2/17) with focal 75-99% proximal right SFA stenosis, occluded mid-distal right SFA, chronic occlusion of all runoff arteries on right. 11. Anemia 12. Prior cocaine abuse.  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   SH: Lives with sister.  Prior cocaine abuse.  Prior ETOH abuse.  Quit smoking in 1/17.  FH: CAD  ROS: All systems reviewed and negative except as per HPI.   Current Outpatient Prescriptions  Medication Sig Dispense Refill  . albuterol (PROVENTIL HFA;VENTOLIN HFA) 108 (90 Base) MCG/ACT inhaler Inhale 2 puffs into the lungs every 6 (six) hours as needed for wheezing or shortness of breath. 1 each 3  . albuterol (PROVENTIL) (2.5 MG/3ML) 0.083% nebulizer solution Take 3 mLs (2.5 mg total) by nebulization every 6 (six) hours as needed. For wheezing. 75 mL 2  . amiodarone (PACERONE) 100 MG tablet Take 1 tablet (100 mg total) by mouth daily.  21 tablet 0  . amLODipine (NORVASC) 10 MG tablet Take 10 mg by mouth daily.    [name] atorvastatin (LIPITOR) 40 MG tablet Take 1 tablet (40 mg total) by mouth every evening. 21 tablet 0  . bisoprolol (ZEBETA) 5 MG tablet Take 0.5 tablets (2.5 mg total) by mouth every evening. 11 tablet 0  . Blood Glucose Monitoring Suppl (ACCU-CHEK AVIVA PLUS) W/DEVICE KIT 1 Device by Does not apply  route 4 (four) times daily -  before meals and at bedtime. 1 kit 0  . cholecalciferol (VITAMIN D) 1000 units tablet Take 1,000 Units by mouth daily.    . cyanocobalamin 1000 MCG tablet Take 1 tablet (1,000 mcg total) by mouth daily. 30 tablet 10  . DULoxetine (CYMBALTA) 30 MG capsule Take 30 mg by mouth daily.    . folic acid (FOLVITE) 1 MG tablet TAKE ONE TABLET BY MOUTH DAILY. 90 tablet 1  . gabapentin (NEURONTIN) 600 MG tablet Take 600 mg by mouth at bedtime.    . insulin aspart (NOVOLOG FLEXPEN) 100 UNIT/ML FlexPen Inject 24-44 Units into the skin 3 (three) times daily with meals.     . Insulin Glargine (LANTUS SOLOSTAR) 100 UNIT/ML Solostar Pen Inject 80 Units into the skin daily at 10 pm.     . Insulin Pen Needle (ULTICARE MICRO PEN NEEDLES) 32G X 4 MM MISC 1 Syringe by Does not apply route 4 (four) times daily - after meals and at bedtime. 1 each 11  . Lancets (ACCU-CHEK SOFT TOUCH) lancets Use as instructed 100 each 12  . levothyroxine (SYNTHROID, LEVOTHROID) 50 MCG tablet Take 1 tablet (50 mcg total) by mouth daily. 30 tablet 2  . losartan (COZAAR) 25 MG tablet Take 1 tablet (25 mg total) by mouth daily. 21 tablet 0  . metolazone (ZAROXOLYN) 2.5 MG tablet Take 1 tablet (2.5 mg total) by mouth once a week. On Thursday. May take an additional 2.5 mg (1 tablet) as directed by HF clinic. 4 tablet 5  . potassium chloride SA (K-DUR,KLOR-CON) 20 MEQ tablet Take 1.5 tablets (30 mEq total) by mouth daily. Take an additional 1 and 1/2 tablets (30 meq) on Thursday with metolazone 36 tablet 0  . spironolactone (ALDACTONE) 25 MG tablet Take 0.5 tablets (12.5 mg total) by mouth daily. 15 tablet 6  . SYMBICORT 160-4.5 MCG/ACT inhaler INHALE 2 PUFFS INTO THE LUNGS TWICE DAILY 1 Inhaler 11  . torsemide (DEMADEX) 20 MG tablet Take 2-4 tablets (40-80 mg total) by mouth as directed. 80 mg (4 tabs) in the AM and 40 mg (2 tabs) in the PM 180 tablet 2  . warfarin (COUMADIN) 5 MG tablet Take 1-1.5 tablets (5-7.5  mg total) by mouth daily. Take 1 tablet (5 mg) daily except 1 & 1/2 tablets (7.5 mg) on Mon, Wed and Fri 30 tablet 0  . nitroGLYCERIN (NITROSTAT) 0.4 MG SL tablet Place 1 tablet (0.4 mg total) under the tongue every 5 (five) minutes as needed for chest pain. (Patient not taking: Reported on 11/24/2016) 25 tablet 3   No current facility-administered medications for this encounter.    Vitals:   11/24/16 1436  BP: 130/70  Pulse: 90  SpO2: 90%  Weight: 259 lb (117.5 kg)     Wt Readings from Last 3 Encounters:  11/24/16 259 lb (117.5 kg)  11/22/16 260 lb (117.9 kg)  11/11/16 262 lb 6.4 oz (119 kg)     General: Obese, NAD HEENT: Normal.  Neck: Thick, JVP difficult, appears ~9-10 cm. No  thyromegaly or nodule noted.  No carotid bruit.  Lungs: Clear, normal effort CV: Nondisplaced PMI.  Heart regular S1/S2, no S3/S4, no murmur.  Abdomen: Obese, soft, NT, ND, no HSM. No bruits or masses. +BS  Skin: Intact without lesions or rashes.  Neurologic: Alert and oriented x 3.   Psych: Normal affect. Extremities: No clubbing or cyanosis. Chronic 1+ edema up to thighs. Unna boots in place.    Assessment/Plan: 1. Chronic systolic CHF:  Has been presumed nonischemic given LHC in 12/15 showing only 80% stenosis in small OM1.  NYHA class II-III.   - Continue torsemide 80 mg qam and 40 mg qpm.  BMET/BNP today.  - Continue metolazone 2.5 mg Thursdays with extra 20 meq of potassium.   - Continue bisoprolol 2.5 mg daily. Has been intolerant of coreg.  - Continue losartan 25 mg daily.     - Increase spironolactone 25 mg daily.  Repeat BMET 10 days.  2. Atrial fibrillation: Paroxysmal. - NSR by exam.  Continue amio 100 mg daily.   -LFTs and TSH stable 11/11/16. PCP following hypothyroidism (on Levoxyl).   Needs regular eye exams. - On coumadin. CBC stable as well.  3. PAD: Bilateral calf claudication seems limiting.  No rest pain or pedal ulcers.  Peripheral arterial dopplers show severe disease on right.   Not candidate for cilostazol with CHF. - No good intervention per Dr. Gwenlyn Found.    - Agree with current plan.  4. COPD:  - On home 02. Not smoking. Have encouraged her to try and get her sister to stop as well.  5. H/o cirrhosis: Likely from ETOH, no longer drinks. No change.  6. Suspected OSA:   - Sleep study completed 11/22/16. Results pending.   7. CKD stage III - Repeat BMET today.  8. HTN:   - Increase spiro as above. 9. Bradycardia: Junctional rhythm when hospitalized in 3/17.   - Stable. Continue low dose bisoprolol. Agree with current plan.  10. Chronic leg wound:  Programme researcher, broadcasting/film/video wound clinic following.   Labs today. Increase spiro, repeat labs 10 days. Follow up 4 weeks.  Sooner with any symptoms.   Shirley Friar, PA-C 11/24/2016  Total time spent > 25 minutes. Over half that spent discussing the above.

## 2016-11-25 ENCOUNTER — Ambulatory Visit (INDEPENDENT_AMBULATORY_CARE_PROVIDER_SITE_OTHER): Payer: Medicare Other | Admitting: Pharmacist

## 2016-11-25 ENCOUNTER — Telehealth (HOSPITAL_COMMUNITY): Payer: Self-pay

## 2016-11-25 DIAGNOSIS — I480 Paroxysmal atrial fibrillation: Secondary | ICD-10-CM | POA: Diagnosis not present

## 2016-11-25 DIAGNOSIS — I4891 Unspecified atrial fibrillation: Secondary | ICD-10-CM | POA: Diagnosis not present

## 2016-11-25 DIAGNOSIS — Z5181 Encounter for therapeutic drug level monitoring: Secondary | ICD-10-CM | POA: Diagnosis not present

## 2016-11-25 LAB — POCT INR: INR: 1.4

## 2016-11-25 NOTE — Telephone Encounter (Signed)
Basic metabolic panel  Order: [id number]  Status:  Final result Visible to patient:  No (Not Released) Dx:  Chronic systolic CHF (congestive hear...  Notes Recorded by Effie Berkshire, RN on 11/25/2016 at 10:50 AM EST Attempted to reach patient by cell phone number listed, wrong number. Demographics updated. ------  Notes Recorded by Effie Berkshire, RN on 11/25/2016 at 10:49 AM EST Left message to call back. ------  Notes Recorded by Shirley Friar, PA-C on 11/24/2016 at 4:52 PM EST Please hold 2 (two) doses of torsemide and resume at 40 mg BID. Can take extra as needed. Decrease potassium to 20 meq daily, extra 20 with metolazone.  Needs repeat end of next week.  Legrand Como [address] [place], [place]

## 2016-11-28 ENCOUNTER — Telehealth (HOSPITAL_COMMUNITY): Payer: Self-pay

## 2016-11-28 DIAGNOSIS — I5043 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) and diastolic (congestive) heart failure: Secondary | ICD-10-CM

## 2016-11-28 MED ORDER — TORSEMIDE 20 MG PO TABS: 40.0000 mg | ORAL_TABLET | Freq: Two times a day (BID) | ORAL | 3 refills | Status: DC

## 2016-11-28 MED ORDER — POTASSIUM CHLORIDE CRYS ER 20 MEQ PO TBCR: 20.0000 meq | EXTENDED_RELEASE_TABLET | Freq: Every day | ORAL | 6 refills | Status: DC

## 2016-11-28 NOTE — Procedures (Signed)
Patient Name: [name], [name] Date: 11/22/2016 Gender: Female D.O.B: 12-01-1960 Age (years): 55 Referring Provider: Barrington Ellison PA-C Height (inches): 3 Interpreting Physician: Fransico Him MD, ABSM Weight (lbs): 260 RPSGT: Laren Everts BMI: 31 MRN: [id number] Neck Size: 17.00  CLINICAL INFORMATION Sleep Study Type: Split Night CPAP  Indication for sleep study: Congestive Heart Failure, COPD, Diabetes, Excessive Daytime Sleepiness, Obesity, Sleep walking/talking/parasomnias, Snoring  Epworth Sleepiness Score: 17  SLEEP STUDY TECHNIQUE As per the AASM Manual for the Scoring of Sleep and Associated Events v2.3 (April 2016) with a hypopnea requiring 4% desaturations.  The channels recorded and monitored were frontal, central and occipital EEG, electrooculogram (EOG), submentalis EMG (chin), nasal and oral airflow, thoracic and abdominal wall motion, anterior tibialis EMG, snore microphone, electrocardiogram, and pulse oximetry. Continuous positive airway pressure (CPAP) was initiated when the patient met split night criteria and was titrated according to treat sleep-disordered breathing.  MEDICATIONS Medications self-administered by patient taken the night of the study : LANTUS  RESPIRATORY PARAMETERS Diagnostic Total AHI (/hr): 58.0  RDI (/hr):71.0  OA Index (/hr): 12.1  CA Index (/hr): 1.3 REM AHI (/hr): N/A  NREM AHI (/hr):58.0  Supine AHI (/hr):N/A  Non-supine AHI (/hr):58.03 Min O2 Sat (%):62.00  Mean O2 (%):87.07  Time below 88% (min):80.0    Titration Optimal Pressure (cm):19  AHI at Optimal Pressure (/hr):3.0  Min O2 at Optimal Pressure (%):88.0 Supine % at Optimal (%):0  Sleep % at Optimal (%):99    SLEEP ARCHITECTURE The recording time for the entire night was 419.2 minutes.  During a baseline period of 179.3 minutes, the patient slept for 134.4 minutes in REM and nonREM, yielding a sleep efficiency of 75.0%. Sleep onset after lights out  was 11.4 minutes with a REM latency of N/A minutes. The patient spent 24.18% of the night in stage N1 sleep, 75.82% in stage N2 sleep, 0.00% in stage N3 and 0.00% in REM.  During the titration period of 231.9 minutes, the patient slept for 179.0 minutes in REM and nonREM, yielding a sleep efficiency of 77.2%. Sleep onset after CPAP initiation was 3.6 minutes with a REM latency of N/A minutes. The patient spent 10.06% of the night in stage N1 sleep, 89.94% in stage N2 sleep, 0.00% in stage N3 and 0.00% in REM.  CARDIAC DATA The 2 lead EKG demonstrated sinus rhythm. The mean heart rate was 77.51 beats per minute. Other EKG findings include: None  LEG MOVEMENT DATA The total Periodic Limb Movements of Sleep (PLMS) were 0. The PLMS index was 0.00 .  IMPRESSIONS - Severe obstructive sleep apnea occurred during the diagnostic portion of the study (AHI = 58.0/hour). An optimal PAP pressure was selected for this patient ( 19 cm of water) - No significant central sleep apnea occurred during the diagnostic portion of the study (CAI = 1.3/hour). - Moderate oxygen desaturation was noted during the diagnostic portion of the study (Min O2 =62.00%).  The total time spent with oxygen saturations < 88% during diagnostic portion was 80 minutes and during CPAP titration 77.3 minutes.  - The patient snored with Moderate snoring volume during the diagnostic portion of the study. - Clinically significant periodic limb movements did not occur during sleep.  DIAGNOSIS - Obstructive Sleep Apnea (327.23 [G47.33 ICD-10]) - Nocturnal Hypoxemia  RECOMMENDATIONS - Trial of CPAP therapy on 19 cm H2O with a Small size Resmed Full Face Mask AirFit F20 mask and heated humidification and 1L O2 via CPAP. - Avoid alcohol, sedatives and other CNS  depressants that may worsen sleep apnea and disrupt normal sleep architecture. - Sleep hygiene should be reviewed to assess factors that may improve sleep quality. - Weight management  and regular exercise should be initiated or continued. - Return to Sleep Center for re-evaluation after 10 weeks of therapy - Overnight pulse oximetry should be obtained to make sure no further hypoxemia is noted.  [place], American Board of Sleep Medicine  ELECTRONICALLY SIGNED ON:  11/28/2016, 5:30 PM [place] PH: [phone]   FX: [phone] [place]

## 2016-11-28 NOTE — Telephone Encounter (Signed)
Faylene Million Peptide  Order: [id number]  Status:  Final result Visible to patient:  No (Not Released) Dx:  Chronic systolic CHF (congestive hear...  Notes Recorded by Effie Berkshire, RN on 11/28/2016 at 9:29 AM EST Patient aware and agreeable. Lab recheck scheduled for 24th, Rx updated to chart and sent into pharmacy per patient request. ------  Notes Recorded by Effie Berkshire, RN on 11/25/2016 at 10:50 AM EST Attempted to reach patient by cell phone number listed, wrong number. Demographics updated. ------  Notes Recorded by Effie Berkshire, RN on 11/25/2016 at 10:49 AM EST Left message to call back. ------  Notes Recorded by Shirley Friar, PA-C on 11/24/2016 at 4:52 PM EST Please hold 2 (two) doses of torsemide and resume at 40 mg BID. Can take extra as needed. Decrease potassium to 20 meq daily, extra 20 with metolazone.  Needs repeat end of next week.  Legrand Como [address] [place], [place]

## 2016-11-29 ENCOUNTER — Other Ambulatory Visit: Payer: Self-pay | Admitting: Cardiology

## 2016-12-07 ENCOUNTER — Ambulatory Visit (HOSPITAL_COMMUNITY)
Admission: RE | Admit: 2016-12-07 | Discharge: 2016-12-07 | Disposition: A | Discharge status: Home / Self Care | Payer: Medicare Other | Source: Ambulatory Visit | Attending: Cardiology | Admitting: Cardiology

## 2016-12-07 DIAGNOSIS — I5022 Chronic systolic (congestive) heart failure: Secondary | ICD-10-CM | POA: Diagnosis present

## 2016-12-07 LAB — BASIC METABOLIC PANEL
Anion gap: 12 (ref 5–15)
BUN: 46 mg/dL — AB (ref 6–20)
CO2: 27 mmol/L (ref 22–32)
Calcium: 9.4 mg/dL (ref 8.9–10.3)
Chloride: 97 mmol/L — ABNORMAL LOW (ref 101–111)
Creatinine, Ser: 1.69 mg/dL — ABNORMAL HIGH (ref 0.44–1.00)
GFR calc Af Amer: 39 mL/min — ABNORMAL LOW (ref >60)
GFR, EST NON AFRICAN AMERICAN: 33 mL/min — AB (ref >60)
Glucose, Bld: 244 mg/dL — ABNORMAL HIGH (ref 65–99)
POTASSIUM: 4.1 mmol/L (ref 3.5–5.1)
SODIUM: 136 mmol/L (ref 135–145)

## 2016-12-07 LAB — BRAIN NATRIURETIC PEPTIDE: B NATRIURETIC PEPTIDE 5: 47.4 pg/mL (ref 0.0–100.0)

## 2016-12-09 ENCOUNTER — Ambulatory Visit (INDEPENDENT_AMBULATORY_CARE_PROVIDER_SITE_OTHER): Payer: Medicare Other | Admitting: Pharmacist Clinician (PhC)/ Clinical Pharmacy Specialist

## 2016-12-09 DIAGNOSIS — I4891 Unspecified atrial fibrillation: Secondary | ICD-10-CM | POA: Diagnosis not present

## 2016-12-09 DIAGNOSIS — Z5181 Encounter for therapeutic drug level monitoring: Secondary | ICD-10-CM

## 2016-12-09 DIAGNOSIS — I480 Paroxysmal atrial fibrillation: Secondary | ICD-10-CM | POA: Diagnosis not present

## 2016-12-09 LAB — POCT INR: INR: 2

## 2016-12-14 ENCOUNTER — Encounter: Payer: Self-pay | Admitting: Cardiology

## 2016-12-22 ENCOUNTER — Ambulatory Visit (HOSPITAL_COMMUNITY)
Admission: RE | Admit: 2016-12-22 | Discharge: 2016-12-22 | Disposition: A | Discharge status: Home / Self Care | Payer: Medicare Other | Source: Ambulatory Visit | Attending: Cardiology | Admitting: Cardiology

## 2016-12-22 VITALS — BP 138/78 | HR 87 | Wt 268.8 lb

## 2016-12-22 DIAGNOSIS — Z8249 Family history of ischemic heart disease and other diseases of the circulatory system: Secondary | ICD-10-CM | POA: Diagnosis not present

## 2016-12-22 DIAGNOSIS — R001 Bradycardia, unspecified: Secondary | ICD-10-CM | POA: Diagnosis not present

## 2016-12-22 DIAGNOSIS — Z7901 Long term (current) use of anticoagulants: Secondary | ICD-10-CM | POA: Insufficient documentation

## 2016-12-22 DIAGNOSIS — I10 Essential (primary) hypertension: Secondary | ICD-10-CM | POA: Diagnosis not present

## 2016-12-22 DIAGNOSIS — I480 Paroxysmal atrial fibrillation: Secondary | ICD-10-CM | POA: Insufficient documentation

## 2016-12-22 DIAGNOSIS — J449 Chronic obstructive pulmonary disease, unspecified: Secondary | ICD-10-CM | POA: Diagnosis not present

## 2016-12-22 DIAGNOSIS — Z87891 Personal history of nicotine dependence: Secondary | ICD-10-CM | POA: Insufficient documentation

## 2016-12-22 DIAGNOSIS — K7460 Unspecified cirrhosis of liver: Secondary | ICD-10-CM | POA: Insufficient documentation

## 2016-12-22 DIAGNOSIS — M79605 Pain in left leg: Secondary | ICD-10-CM

## 2016-12-22 DIAGNOSIS — Z79899 Other long term (current) drug therapy: Secondary | ICD-10-CM | POA: Diagnosis not present

## 2016-12-22 DIAGNOSIS — I2510 Atherosclerotic heart disease of native coronary artery without angina pectoris: Secondary | ICD-10-CM | POA: Insufficient documentation

## 2016-12-22 DIAGNOSIS — I5022 Chronic systolic (congestive) heart failure: Secondary | ICD-10-CM | POA: Insufficient documentation

## 2016-12-22 DIAGNOSIS — E1151 Type 2 diabetes mellitus with diabetic peripheral angiopathy without gangrene: Secondary | ICD-10-CM | POA: Diagnosis not present

## 2016-12-22 DIAGNOSIS — I130 Hypertensive heart and chronic kidney disease with heart failure and stage 1 through stage 4 chronic kidney disease, or unspecified chronic kidney disease: Secondary | ICD-10-CM | POA: Insufficient documentation

## 2016-12-22 DIAGNOSIS — N183 Chronic kidney disease, stage 3 unspecified: Secondary | ICD-10-CM

## 2016-12-22 DIAGNOSIS — Z9981 Dependence on supplemental oxygen: Secondary | ICD-10-CM | POA: Diagnosis not present

## 2016-12-22 DIAGNOSIS — M79604 Pain in right leg: Secondary | ICD-10-CM

## 2016-12-22 DIAGNOSIS — R29818 Other symptoms and signs involving the nervous system: Secondary | ICD-10-CM

## 2016-12-22 DIAGNOSIS — Z794 Long term (current) use of insulin: Secondary | ICD-10-CM | POA: Diagnosis not present

## 2016-12-22 DIAGNOSIS — E1122 Type 2 diabetes mellitus with diabetic chronic kidney disease: Secondary | ICD-10-CM | POA: Diagnosis not present

## 2016-12-22 DIAGNOSIS — E039 Hypothyroidism, unspecified: Secondary | ICD-10-CM | POA: Insufficient documentation

## 2016-12-22 DIAGNOSIS — G4730 Sleep apnea, unspecified: Secondary | ICD-10-CM

## 2016-12-22 LAB — BASIC METABOLIC PANEL
Anion gap: 13 (ref 5–15)
BUN: 45 mg/dL — AB (ref 6–20)
CHLORIDE: 93 mmol/L — AB (ref 101–111)
CO2: 31 mmol/L (ref 22–32)
Calcium: 9.3 mg/dL (ref 8.9–10.3)
Creatinine, Ser: 1.69 mg/dL — ABNORMAL HIGH (ref 0.44–1.00)
GFR calc Af Amer: 39 mL/min — ABNORMAL LOW (ref >60)
GFR, EST NON AFRICAN AMERICAN: 33 mL/min — AB (ref >60)
Glucose, Bld: 386 mg/dL — ABNORMAL HIGH (ref 65–99)
POTASSIUM: 4 mmol/L (ref 3.5–5.1)
Sodium: 137 mmol/L (ref 135–145)

## 2016-12-22 NOTE — Addendum Note (Signed)
Encounter addended by: Louann Liv, LCSW on: 12/22/2016  4:52 PM<BR>    Actions taken: Sign clinical note

## 2016-12-22 NOTE — Progress Notes (Signed)
CSW met in the clinic with patient and [facility] paramedic. Patient spoke about her frustrations with illness and disability as well as her motivation. Patient requesting assistance with getting some exercise programs through her insurance. Patient shared that she and her sister are trying to motivate one another for improved health. Patient stated that at time she gets down because she is limited with mobility. CSW provided supportive intervention around issues of coping and adjustment to disability. CSW also discussed possible referral to [name] program and contacted with message left for return call. Patient verbalizes understanding of follow up and will contact CSW with information as needed. CSW will return call to patient with information regarding exercise programs once information obtained. [name], [place], [place]

## 2016-12-22 NOTE — Patient Instructions (Signed)
Labs today We will only contact you if something comes back abnormal or we need to make some changes. Otherwise no news is good news!  Your physician recommends that you schedule a follow-up appointment in: 4 weeks with Rebecca Eaton

## 2016-12-22 NOTE — Progress Notes (Signed)
Patient ID: [name], female   DOB: September 18, 1961, 56 y.o.   MRN: [id number]    Advanced Heart Failure Clinic Note   PCP: Dr Roxy Manns HF Cardiology: Dr. Aundra Dubin PV: Dr. Gwenlyn Found  56 yo with history of PAD, carotid stenosis s/p left CEA, relatively mild CAD, chronic systolic CHF, paroxysmal atrial fibrillation and prior substance abuse presents for CHF clinic followup.  She was admitted in 1/17 with acute hypoxemic respiratory failure in the setting of atrial fibrillation/RVR and volume overload.  She was initially intubated.  She converted back to NSR with amiodarone gtt.  She was treated with IV Lasix, steroids, bronchodilators.  She developed AKI and losartan was stopped.    She was admitted in 3/17 with symptomatic bradycardia.  She was supposed to stop diltiazem after this admission but continued to take it.  She presented later in 3/17, again with symptomatic bradycardia (junctional bradycardia), hypotension, and AKI.  Diltiazem and bisoprolol were stopped.  HR recovered and she has had no problems since.  ACEI was stopped with AKI.   She presents today for follow up. Feeling OK, but depressed with "life in general". Living in a single wide currently with her sister. Trying to look into section 8 housing to decrease their bills. Feels like she's never going to get "better". Not very active. Says all she eats is fish and chicken, baked, with vegetables. Breathing is "OK". ADLs mostly limited to joint pains, not so much DOE.  Not very active.  Using lymphedema pumps. Has been taking metolazone every Thursday.  Watching her fluid in take.   Echo 11/17/16  LVEF 40-45%, Grade 2 DD, Moderate LAE, RV normal  Labs (1/17): K 5.2, creatinine 1.4, AST 43, ALT normal Labs (2/17): K 4.2, creatinine 1.3, BNP 607, TSH normal Labs (3/17): K 4.2, creatinine 1.45, AST/ALT normal, HCT 28.7 Labs (4/17): K 4.4, creatinine 1.61 Labs (08/31/2016): K 4.5 Creatinine 1.43  Labs (11/17): K 4.2, creatinine 1.4  PMH: 1.  Carotid stenosis: Known occluded right carotid.  Left CEA in 4/16.  2. CAD: LHC in 12/15 with 80% stenosis in small OM1, nonobstructive disease in other territories.  3. Chronic systolic CHF: Nonischemic cardiomyopathy (?due to ETOH or prior drug abuse).  - Echo (1/17) with EF 45%, mild LV hypertrophy, moderate diastolic dysfunction, inferolateral severe hypokinesis, mildly decreased RV systolic function.  4. Atrial fibrillation: Paroxysmal.   5. Type II diabetes 6. CKD: Stage III. 7. COPD: Quit smoking 1/17. On home oxygen.  8. 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  No longer drinks.  9. Hypothyroidism 10. PAD: Atherectomy SFA in 2014 (Dr Gwenlyn Found).  Peripheral arterial dopplers (2/17) with focal 75-99% proximal right SFA stenosis, occluded mid-distal right SFA, chronic occlusion of all runoff arteries on right. 11. Anemia 12. Prior cocaine abuse.  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   SH: Lives with sister.  Prior cocaine abuse.  Prior ETOH abuse.  Quit smoking in 1/17.  FH: CAD  ROS: All systems reviewed and negative except as per HPI.   Current Outpatient Prescriptions  Medication Sig Dispense Refill  . albuterol (PROVENTIL HFA;VENTOLIN HFA) 108 (90 Base) MCG/ACT inhaler Inhale 2 puffs into the lungs every 6 (six) hours as needed for wheezing or shortness of breath. 1 each 3  . albuterol (PROVENTIL) (2.5 MG/3ML) 0.083% nebulizer solution Take 3 mLs (2.5 mg total) by nebulization every 6 (six) hours as needed. For wheezing. 75 mL 2  . amiodarone (PACERONE) 100 MG tablet Take  1 tablet (100 mg total) by mouth daily. 21 tablet 0  . amLODipine (NORVASC) 10 MG tablet Take 10 mg by mouth daily.    [name] atorvastatin (LIPITOR) 40 MG tablet Take 1 tablet (40 mg total) by mouth every evening. 21 tablet 0  . bisoprolol (ZEBETA) 5 MG tablet Take 0.5 tablets (2.5 mg total) by mouth every evening. 11 tablet 0  . Blood Glucose Monitoring Suppl (ACCU-CHEK AVIVA PLUS)  W/DEVICE KIT 1 Device by Does not apply route 4 (four) times daily -  before meals and at bedtime. 1 kit 0  . cholecalciferol (VITAMIN D) 1000 units tablet Take 1,000 Units by mouth daily.    . cyanocobalamin 1000 MCG tablet Take 1 tablet (1,000 mcg total) by mouth daily. 30 tablet 10  . DULoxetine (CYMBALTA) 30 MG capsule Take 30 mg by mouth daily.    . folic acid (FOLVITE) 1 MG tablet TAKE 1 TABLET BY MOUTH EVERY DAY 90 tablet 1  . gabapentin (NEURONTIN) 600 MG tablet Take 600 mg by mouth at bedtime.    . insulin aspart (NOVOLOG FLEXPEN) 100 UNIT/ML FlexPen Inject 24-44 Units into the skin 3 (three) times daily with meals.     . Insulin Glargine (LANTUS SOLOSTAR) 100 UNIT/ML Solostar Pen Inject 80 Units into the skin daily at 10 pm.     . Insulin Pen Needle (ULTICARE MICRO PEN NEEDLES) 32G X 4 MM MISC 1 Syringe by Does not apply route 4 (four) times daily - after meals and at bedtime. 1 each 11  . Lancets (ACCU-CHEK SOFT TOUCH) lancets Use as instructed 100 each 12  . levothyroxine (SYNTHROID, LEVOTHROID) 50 MCG tablet Take 1 tablet (50 mcg total) by mouth daily. 30 tablet 2  . losartan (COZAAR) 25 MG tablet Take 1 tablet (25 mg total) by mouth daily. 21 tablet 0  . metolazone (ZAROXOLYN) 2.5 MG tablet Take 1 tablet (2.5 mg total) by mouth once a week. On Thursday. May take an additional 2.5 mg (1 tablet) as directed by HF clinic. 4 tablet 5  . nitroGLYCERIN (NITROSTAT) 0.4 MG SL tablet Place 1 tablet (0.4 mg total) under the tongue every 5 (five) minutes as needed for chest pain. 25 tablet 3  . potassium chloride SA (K-DUR,KLOR-CON) 20 MEQ tablet Take 1 tablet (20 mEq total) by mouth daily. Take an additional 20 meq (1 tab) on Thursday with metolazone 35 tablet 6  . spironolactone (ALDACTONE) 25 MG tablet Take 1 tablet (25 mg total) by mouth daily. 30 tablet 6  . SYMBICORT 160-4.5 MCG/ACT inhaler INHALE 2 PUFFS INTO THE LUNGS TWICE DAILY 1 Inhaler 11  . torsemide (DEMADEX) 20 MG tablet Take 2  tablets (40 mg total) by mouth 2 (two) times daily. 120 tablet 3  . warfarin (COUMADIN) 5 MG tablet Take 1-1.5 tablets (5-7.5 mg total) by mouth daily. Take 1 tablet (5 mg) daily except 1 & 1/2 tablets (7.5 mg) on Mon, Wed and Fri 30 tablet 0   No current facility-administered medications for this encounter.    Vitals:   12/22/16 1413  BP: 138/78  Pulse: 87  SpO2: 99%  Weight: 268 lb 12.8 oz (121.9 kg)     Wt Readings from Last 3 Encounters:  12/22/16 268 lb 12.8 oz (121.9 kg)  11/24/16 259 lb (117.5 kg)  11/22/16 260 lb (117.9 kg)     General: Obese, NAD HEENT: Normal Neck: Thick, JVP difficult, appears 8-9 cm. No thyromegaly or nodule noted.  No carotid bruit.  Lungs: CTAB,  normal effort CV: Nondisplaced PMI.  Heart regular S1/S2, no S3/S4, no murmur.  Abdomen: Obese, soft, NT, ND, no HSM. No bruits or masses. +BS  Skin: Intact without lesions or rashes.  Neurologic: Alert and oriented x 3.   Psych: Normal affect. Extremities: No clubbing or cyanosis. Chronic 1+ up to thighs with unna boots in place.     Assessment/Plan: 1. Chronic systolic CHF:  Has been presumed nonischemic given LHC in 12/15 showing only 80% stenosis in small OM1.   - NYHA class likely III. Difficult to assess with deconditioning, arthritis, and neuropathy.    - Continue torsemide 40 mg BID for now. BMET today.  - Continue metolazone 2.5 mg Thursdays with extra 20 meq of potassium.  Took this am.  - Continue bisoprolol 2.5 mg daily. Has been intolerant of coreg.  - Continue losartan 25 mg daily.     - Continue spironolactone 25 mg daily.   2. Atrial fibrillation: Paroxysmal. - NSR by exam. Continue amio 100 mg daily.   -LFTs and TSH stable 11/11/16. PCP following hypothyroidism (on Levoxyl).   Needs regular eye exams. - On coumadin. CBC stable as well.  - No change to current plan.  3. PAD: Bilateral calf claudication seems limiting.  No rest pain or pedal ulcers.  Peripheral arterial dopplers show  severe disease on right.  Not candidate for cilostazol with CHF. - No good intervention per Dr. Gwenlyn Found.    - No change to current plan.  4. COPD:  - On home 02.  - Encouraged not to smoke at all, and to encourage her sister to stop completely.  5. H/o cirrhosis: Likely from ETOH, no longer drinks.  - No change to current plan.  6. Suspected OSA:   - Sleep study completed 11/22/16. Results still pending. Encouraged to call for results and further instructions.  7. CKD stage III - Repeat BMET today.  8. HTN:   - Relatively stable on current medications.  9. Bradycardia: Junctional rhythm when hospitalized in 3/17.   - Stable. Continue low dose bisoprolol. May try to increase at next visit.   10. Chronic leg wound:  - Released from [facility] wound clinic last week. Legs much improved.   BMET today. Continue current medications. Reinforced fluid restriction to < 2 L daily, sodium restriction to less than 2000 mg daily, and the importance of daily weights.    Shirley Friar, PA-C 12/22/2016

## 2016-12-27 ENCOUNTER — Other Ambulatory Visit (HOSPITAL_COMMUNITY): Payer: Self-pay

## 2016-12-27 NOTE — Progress Notes (Signed)
Paramedicine Encounter    Patient ID: [name], female    DOB: 09-Jan-1961, 56 y.o.   MRN: [id number]   Patient Care Team: Sandi Mariscal, MD as PCP - General (Internal Medicine) [name], MD as Consulting Physician (Cardiology) [name], MD as Consulting Physician (Pulmonary Disease)  Patient Active Problem List   Diagnosis Date Noted  . CKD (chronic kidney disease), stage III 11/24/2016  . Suspected sleep apnea 11/24/2016  . Severe obesity (BMI >= 40) ([place]) 02/24/2016  . COPD GOLD 0  02/24/2016  . Chronic systolic CHF (congestive heart failure) ([place]) 02/12/2016  . Encounter for therapeutic drug monitoring 02/10/2016  . Symptomatic bradycardia 01/12/2016  . Essential hypertension 12/22/2015  . Wheeze   . 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 Tobacco abuse 10/23/2015  . Coronary artery disease involving native coronary artery of native heart   . DOE (dyspnea on exertion) 04/29/2015  . PAF (paroxysmal atrial fibrillation) ([place]) 01/16/2015  . Carotid arterial disease ([place]) 01/16/2015  . Claudication ([place]) 01/15/2015  . Demand ischemia ([place]) 10/29/2014  . Insomnia 02/03/2014  . S/P peripheral artery angioplasty - [place]bo[place] atherectomy; R SFA 09/11/2013    Class: Acute  . Leg pain, bilateral 08/19/2013  . Hypothyroidism 07/31/2013  . Cellulitis 06/13/2013  . History of cocaine abuse 06/13/2013  . Long term current use of anticoagulant therapy 05/20/2013  . Alcohol abuse   . Narcotic abuse   . Marijuana abuse   . Alcoholic cirrhosis ([place])   . DM (diabetes mellitus), type 2 with peripheral vascular complications ([name])     Current Outpatient Prescriptions:  .  albuterol (PROVENTIL HFA;VENTOLIN HFA) 108 (90 Base) MCG/ACT inhaler, Inhale 2 puffs into the lungs every 6 (six) hours as needed for wheezing or shortness of breath., Disp: 1 each, Rfl: 3 .  albuterol (PROVENTIL) (2.5 MG/3ML) 0.083% nebulizer solution, Take 3 mLs (2.5 mg total) by nebulization every 6 (six)  hours as needed. For wheezing., Disp: 75 mL, Rfl: 2 .  amiodarone (PACERONE) 100 MG tablet, Take 1 tablet (100 mg total) by mouth daily., Disp: 21 tablet, Rfl: 0 .  amLODipine (NORVASC) 10 MG tablet, Take 10 mg by mouth daily., Disp: , Rfl:  .  atorvastatin (LIPITOR) 40 MG tablet, Take 1 tablet (40 mg total) by mouth every evening., Disp: 21 tablet, Rfl: 0 .  bisoprolol (ZEBETA) 5 MG tablet, Take 0.5 tablets (2.5 mg total) by mouth every evening., Disp: 11 tablet, Rfl: 0 .  Blood Glucose Monitoring Suppl (ACCU-CHEK AVIVA PLUS) W/DEVICE KIT, 1 Device by Does not apply route 4 (four) times daily -  before meals and at bedtime., Disp: 1 kit, Rfl: 0 .  cholecalciferol (VITAMIN D) 1000 units tablet, Take 1,000 Units by mouth daily., Disp: , Rfl:  .  cyanocobalamin 1000 MCG tablet, Take 1 tablet (1,000 mcg total) by mouth daily., Disp: 30 tablet, Rfl: 10 .  folic acid (FOLVITE) 1 MG tablet, TAKE 1 TABLET BY MOUTH EVERY DAY, Disp: 90 tablet, Rfl: 1 .  gabapentin (NEURONTIN) 600 MG tablet, Take 300 mg by mouth at bedtime. , Disp: , Rfl:  .  insulin aspart (NOVOLOG FLEXPEN) 100 UNIT/ML FlexPen, Inject 24-44 Units into the skin 3 (three) times daily with meals. , Disp: , Rfl:  .  Insulin Glargine (LANTUS SOLOSTAR) 100 UNIT/ML Solostar Pen, Inject 80 Units into the skin daily at 10 pm. , Disp: , Rfl:  .  Insulin Pen Needle (ULTICARE MICRO PEN NEEDLES) 32G X 4 MM MISC,  1 Syringe by Does not apply route 4 (four) times daily - after meals and at bedtime., Disp: 1 each, Rfl: 11 .  Lancets (ACCU-CHEK SOFT TOUCH) lancets, Use as instructed, Disp: 100 each, Rfl: 12 .  levothyroxine (SYNTHROID, LEVOTHROID) 50 MCG tablet, Take 1 tablet (50 mcg total) by mouth daily., Disp: 30 tablet, Rfl: 2 .  losartan (COZAAR) 25 MG tablet, Take 1 tablet (25 mg total) by mouth daily., Disp: 21 tablet, Rfl: 0 .  metolazone (ZAROXOLYN) 2.5 MG tablet, Take 1 tablet (2.5 mg total) by mouth once a week. On Thursday. May take an additional  2.5 mg (1 tablet) as directed by HF clinic., Disp: 4 tablet, Rfl: 5 .  potassium chloride SA (K-DUR,KLOR-CON) 20 MEQ tablet, Take 1 tablet (20 mEq total) by mouth daily. Take an additional 20 meq (1 tab) on Thursday with metolazone, Disp: 35 tablet, Rfl: 6 .  spironolactone (ALDACTONE) 25 MG tablet, Take 1 tablet (25 mg total) by mouth daily., Disp: 30 tablet, Rfl: 6 .  SYMBICORT 160-4.5 MCG/ACT inhaler, INHALE 2 PUFFS INTO THE LUNGS TWICE DAILY, Disp: 1 Inhaler, Rfl: 11 .  torsemide (DEMADEX) 20 MG tablet, Take 2 tablets (40 mg total) by mouth 2 (two) times daily., Disp: 120 tablet, Rfl: 3 .  warfarin (COUMADIN) 5 MG tablet, Take 1-1.5 tablets (5-7.5 mg total) by mouth daily. Take 1 tablet (5 mg) daily except 1 & 1/2 tablets (7.5 mg) on Mon, Wed and Fri, Disp: 30 tablet, Rfl: 0 .  DULoxetine (CYMBALTA) 30 MG capsule, Take 30 mg by mouth daily., Disp: , Rfl:  .  nitroGLYCERIN (NITROSTAT) 0.4 MG SL tablet, Place 1 tablet (0.4 mg total) under the tongue every 5 (five) minutes as needed for chest pain. (Patient not taking: Reported on 12/27/2016), Disp: 25 tablet, Rfl: 3 Allergies  Allergen Reactions  . Gabapentin Nausea And Vomiting    POSSIBLE SHAKING? TAKING MED CURRENTLY     Social History   Social History  . Marital status: Single    Spouse name: N/A  . Number of children: N/A  . Years of education: N/A   Occupational History  . disabled    Social History Main Topics  . Smoking status: Former Smoker    Packs/day: 0.00    Years: 40.00    Types: E-cigarettes    Quit date: 11/03/2015  . Smokeless tobacco: Former Systems developer  . Alcohol use No  . Drug use: No     Comment: 04/29/2015 "last drug use was ~ 09/08/2013"  . Sexual activity: No   Other Topics Concern  . Not on file   Social History Narrative   Lives in [place], in [place] with sister.  They are looking to move but don't have a place to go yet.      Physical Exam  Constitutional: She appears well-developed.  HENT:  Head:  Normocephalic.  Eyes: Pupils are equal, round, and reactive to light.  Pulmonary/Chest: She has wheezes.  Wheezing to the rt upper lobe  Abdominal: Soft.  Musculoskeletal: She exhibits edema.  Both legs are edematous and reddened.         [facility] - 12/27/16 1100      Situation   Admitting diagnosis chf   Heart failure history Exisiting   Comorbidities DM;HTN;Poor Wound Healing;Sleep Apnea;CKD/renal insufficiency;Anemia;Atrial fibillation;Hx DVT/PE;Hx MI/CAD   Readmitted within 30 days No     Assessment   Lives alone No   Primary support person sister-billie   Mode of transportation personal car;family/friends  Other services involved None   Home equipement Scale;Wheelchair     [employer]   Weighs self daily Yes   Scale provided No  pt has own    Records on weight chart Yes     Resources   Has "Living better w/heart failure" book Yes   Has HF Zone tool Yes   Able to identify yellow zone signs/when to call MD Yes   Records zone daily No     Medications   Uses a pill box Yes   Who stocks the pill box paramedicine   Pill box checked this visit Yes   Pill box refilled this visit Yes   Difficulty obtaining medications No   Mail order medications Yes   New medications at home today No   Next scheduled delivery of medications 12/29/16   Missed one or more doses of medications per week No     Nutrition   Patient receives meals on wheels No   Patient follows low sodium diet No   Has foods at home that meet the current recommended diet Yes   Patient follows low sugar/card diet No   Nutritional concerns/issues y     Activity Level   ADL's/Mobility Independent   How many feet can patient ambulate 50f   Typical activity level poor mobility   Barriers reading/pain to legs   Actvity tolerance: NYHA Class 3     Urine   Difficulty urinating No   Changes in urine None     Time spent with patient   Time spent with patient  [place]- 12/27/16 1100      Outside of House   Sidewalk and pathway to house is level and free from any hazards Yes   Driveway is free from debris/snow/ice Yes   Outside stairs are stable and have sturdy handrail Yes   Porch lights are working and provide adequate lighting Yes     Living Room   Furniture is of adequate height and offers arm rests that assist in getting up and down Yes   Floor is free from any clutter that would create tripping hazards Yes   All cords are either behind furniture or secured in a manner that does not cause trip hazards Yes   All rugs are secured to floor with double-sided tape No   Lighting is adequate to light room Yes   All lighting has an easily accessible on/off switch Yes   Phone is readily accessible near favorite seating areas Yes   Emergency numbers are printed near all phones in house Yes     Kitchen   Items used most often are within easy reach on low shelves Yes   Step stool is present, is sturdy and has a handrail Yes   Floor mats are non-slip tread and secured to floor Yes   Oven controls are within easy reach Yes   Kitchen lighting is adequate and easy to reach switches Yes   ABC fire extinguisher is located in kitchen Yes     [place] properly secured to stairs and/or all wood is properly secured N/A   Handrail is present and sturdy N/A   Stairs are free from any clutter N/A   Stairway is adequately lit N/A     Bathroom   Tub and shower have a non-slip surface No   Tub and/or shower have a grab bar for stability No  Toilet has a raised seat Yes   Grab bar is attached near toilet for assistance N/A   Pathway from bedroom to bathroom is free from clutter and well lit for ease of movement in the middle of the night Yes     Bedroom   Floor is free from clutter Yes   Light is near bed and is easy to turn on Yes   Phone is next to bed and within easy reach Yes   Flashlight is near bed in case of emergency No      General   Smoke detectors in all areas of the house (each floor) and tested Yes   CO detectors on each floor of house and tested No   Flashlights are handy throughout the home No   Resident has all medical information readily available and in an area emergency providers will easily find Yes   All heaters are away from any type of flammable material N/A     Overall Tips   Homeowner ha good non-skid shoes to move around house Yes   All assisted walking devices are readily accessible and in good condition Yes   There is a phone near the floor for ease of reach in case of a fall YES   All O2 tubing is less than 50 ft. and is not a trip hazard N/A   Resident has had an annual hearing and vision check by a physician Yes   Resident has the proper hearing and visual aids prescribed and are in good working order No   All medications are properly stored and labeled to avoid confusion on dosage, time to take, and avoidance of missed doses Yes       Future Appointments Date Time Provider [place]  12/30/2016 11:00 AM [facility]  01/19/2017 10:00 AM [facility] PA/NP [facility] None   BP 130/90 (BP Location: Left Arm, Patient Position: Sitting, Cuff Size: Normal)   Pulse 70   Resp 18   Wt 266 lb 6.4 oz (120.8 kg)   LMP 11/27/2012   SpO2 98%   BMI 45.73 kg/m  Weight yesterday-264 Last visit weight-268.4 FBS 116  Pt reports she is tired today as last nigh she didn't sleep good due to her severe leg pains. Her weight is still fluctuating. No changes in urination. She reports her breathing is doing ok. She has limited mobility due to her leg pains-she no longer is going to her wound doctor as she was d/c. She reports that she is staying with low sodium/low salt diet. States that sometimes she drinks more than other days. Encouraged her to limit her salt/sodium and fluids daily.  Pt legs are swollen, tight to her knees and reddened worse than last week. She  states she goes back to wound center on 2/22-advised her to contact the wound center to let them know her legs have worsened and see if they can sch the visit sooner.  She also is still out of cymbalta-she doesn't think it will be delivered with this next shipment of meds-advised her to contact them about the refills as the pharmacy hadnt heard back from the office. We also called in spiro refill for pt at [facility].   ACTION: Home visit completed Next visit planned for 1 week   Marylouise Stacks, EMT -Paramedic

## 2016-12-29 ENCOUNTER — Telehealth: Payer: Self-pay | Admitting: Licensed Clinical Social Worker

## 2016-12-29 ENCOUNTER — Other Ambulatory Visit (HOSPITAL_COMMUNITY): Payer: Self-pay | Admitting: Cardiology

## 2016-12-29 NOTE — Telephone Encounter (Signed)
CSW referred patient to [facility] for the [name] program per request. Patient very excited about starting and getting "healthy". CSW notified [name] from paramedic to inform of start of program. CSW continues to coordinate services with paramedicine and AHF clinic. Raquel Sarna, [place], [place]

## 2016-12-30 ENCOUNTER — Ambulatory Visit (INDEPENDENT_AMBULATORY_CARE_PROVIDER_SITE_OTHER): Payer: Medicare Other | Admitting: Pharmacist

## 2016-12-30 ENCOUNTER — Telehealth: Payer: Self-pay

## 2016-12-30 DIAGNOSIS — I480 Paroxysmal atrial fibrillation: Secondary | ICD-10-CM

## 2016-12-30 DIAGNOSIS — I4891 Unspecified atrial fibrillation: Secondary | ICD-10-CM

## 2016-12-30 DIAGNOSIS — Z5181 Encounter for therapeutic drug level monitoring: Secondary | ICD-10-CM | POA: Diagnosis not present

## 2016-12-30 LAB — POCT INR: INR: 1.8

## 2016-12-30 NOTE — Telephone Encounter (Signed)
Received referral for [place] to enroll into the PREP at [facility].  Left her a voicemail as she didn't answer the call.

## 2017-01-03 ENCOUNTER — Other Ambulatory Visit (HOSPITAL_COMMUNITY): Payer: Self-pay

## 2017-01-03 NOTE — Progress Notes (Signed)
Paramedicine Encounter    Patient ID: [name], female    DOB: 09-Jan-1961, 56 y.o.   MRN: [id number]   Patient Care Team: Sandi Mariscal, MD as PCP - General (Internal Medicine) [name], MD as Consulting Physician (Cardiology) [name], MD as Consulting Physician (Pulmonary Disease)  Patient Active Problem List   Diagnosis Date Noted  . CKD (chronic kidney disease), stage III 11/24/2016  . Suspected sleep apnea 11/24/2016  . Severe obesity (BMI >= 40) ([place]) 02/24/2016  . COPD GOLD 0  02/24/2016  . Chronic systolic CHF (congestive heart failure) ([place]) 02/12/2016  . Encounter for therapeutic drug monitoring 02/10/2016  . Symptomatic bradycardia 01/12/2016  . Essential hypertension 12/22/2015  . Wheeze   . 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 Tobacco abuse 10/23/2015  . Coronary artery disease involving native coronary artery of native heart   . DOE (dyspnea on exertion) 04/29/2015  . PAF (paroxysmal atrial fibrillation) ([place]) 01/16/2015  . Carotid arterial disease ([place]) 01/16/2015  . Claudication ([place]) 01/15/2015  . Demand ischemia ([place]) 10/29/2014  . Insomnia 02/03/2014  . S/P peripheral artery angioplasty - TurboHawk atherectomy; R SFA 09/11/2013    Class: Acute  . Leg pain, bilateral 08/19/2013  . Hypothyroidism 07/31/2013  . Cellulitis 06/13/2013  . History of cocaine abuse 06/13/2013  . Long term current use of anticoagulant therapy 05/20/2013  . Alcohol abuse   . Narcotic abuse   . Marijuana abuse   . Alcoholic cirrhosis ([place])   . DM (diabetes mellitus), type 2 with peripheral vascular complications ([name])     Current Outpatient Prescriptions:  .  albuterol (PROVENTIL HFA;VENTOLIN HFA) 108 (90 Base) MCG/ACT inhaler, Inhale 2 puffs into the lungs every 6 (six) hours as needed for wheezing or shortness of breath., Disp: 1 each, Rfl: 3 .  albuterol (PROVENTIL) (2.5 MG/3ML) 0.083% nebulizer solution, Take 3 mLs (2.5 mg total) by nebulization every 6 (six)  hours as needed. For wheezing., Disp: 75 mL, Rfl: 2 .  amiodarone (PACERONE) 100 MG tablet, Take 1 tablet (100 mg total) by mouth daily., Disp: 21 tablet, Rfl: 0 .  amLODipine (NORVASC) 10 MG tablet, Take 10 mg by mouth daily., Disp: , Rfl:  .  atorvastatin (LIPITOR) 40 MG tablet, Take 1 tablet (40 mg total) by mouth every evening., Disp: 21 tablet, Rfl: 0 .  bisoprolol (ZEBETA) 5 MG tablet, Take 0.5 tablets (2.5 mg total) by mouth every evening., Disp: 11 tablet, Rfl: 0 .  Blood Glucose Monitoring Suppl (ACCU-CHEK AVIVA PLUS) W/DEVICE KIT, 1 Device by Does not apply route 4 (four) times daily -  before meals and at bedtime., Disp: 1 kit, Rfl: 0 .  cholecalciferol (VITAMIN D) 1000 units tablet, Take 1,000 Units by mouth daily., Disp: , Rfl:  .  cyanocobalamin 1000 MCG tablet, Take 1 tablet (1,000 mcg total) by mouth daily., Disp: 30 tablet, Rfl: 10 .  folic acid (FOLVITE) 1 MG tablet, TAKE 1 TABLET BY MOUTH EVERY DAY, Disp: 90 tablet, Rfl: 1 .  gabapentin (NEURONTIN) 600 MG tablet, Take 300 mg by mouth at bedtime. , Disp: , Rfl:  .  insulin aspart (NOVOLOG FLEXPEN) 100 UNIT/ML FlexPen, Inject 24-44 Units into the skin 3 (three) times daily with meals. , Disp: , Rfl:  .  Insulin Glargine (LANTUS SOLOSTAR) 100 UNIT/ML Solostar Pen, Inject 80 Units into the skin daily at 10 pm. , Disp: , Rfl:  .  Insulin Pen Needle (ULTICARE MICRO PEN NEEDLES) 32G X 4 MM MISC,  1 Syringe by Does not apply route 4 (four) times daily - after meals and at bedtime., Disp: 1 each, Rfl: 11 .  Lancets (ACCU-CHEK SOFT TOUCH) lancets, Use as instructed, Disp: 100 each, Rfl: 12 .  levothyroxine (SYNTHROID, LEVOTHROID) 50 MCG tablet, Take 1 tablet (50 mcg total) by mouth daily., Disp: 30 tablet, Rfl: 2 .  losartan (COZAAR) 25 MG tablet, Take 1 tablet (25 mg total) by mouth daily., Disp: 21 tablet, Rfl: 0 .  metolazone (ZAROXOLYN) 2.5 MG tablet, Take 1 tablet (2.5 mg total) by mouth once a week. On Thursday. May take an additional  2.5 mg (1 tablet) as directed by HF clinic., Disp: 4 tablet, Rfl: 5 .  potassium chloride SA (K-DUR,KLOR-CON) 20 MEQ tablet, Take 1 tablet (20 mEq total) by mouth daily. Take an additional 20 meq (1 tab) on Thursday with metolazone, Disp: 35 tablet, Rfl: 6 .  spironolactone (ALDACTONE) 25 MG tablet, Take 1 tablet (25 mg total) by mouth daily., Disp: 30 tablet, Rfl: 6 .  SYMBICORT 160-4.5 MCG/ACT inhaler, INHALE 2 PUFFS INTO THE LUNGS TWICE DAILY, Disp: 1 Inhaler, Rfl: 11 .  torsemide (DEMADEX) 20 MG tablet, Take 2 tablets (40 mg total) by mouth 2 (two) times daily., Disp: 120 tablet, Rfl: 3 .  warfarin (COUMADIN) 5 MG tablet, Take 1-1.5 tablets (5-7.5 mg total) by mouth daily. Take 1 tablet (5 mg) daily except 1 & 1/2 tablets (7.5 mg) on Mon, Wed and Fri, Disp: 30 tablet, Rfl: 0 .  amiodarone (PACERONE) 100 MG tablet, TAKE 1 TABLET BY MOUTH ONCE DAILY, Disp: 30 tablet, Rfl: 10 .  bisoprolol (ZEBETA) 5 MG tablet, TAKE 1/2 TABLET BY MOUTH EVERY DAY, Disp: 15 tablet, Rfl: 2 .  DULoxetine (CYMBALTA) 30 MG capsule, Take 30 mg by mouth daily., Disp: , Rfl:  .  nitroGLYCERIN (NITROSTAT) 0.4 MG SL tablet, Place 1 tablet (0.4 mg total) under the tongue every 5 (five) minutes as needed for chest pain. (Patient not taking: Reported on 12/27/2016), Disp: 25 tablet, Rfl: 3 Allergies  Allergen Reactions  . Gabapentin Nausea And Vomiting    POSSIBLE SHAKING? TAKING MED CURRENTLY     Social History   Social History  . Marital status: Single    Spouse name: N/A  . Number of children: N/A  . Years of education: N/A   Occupational History  . disabled    Social History Main Topics  . Smoking status: Former Smoker    Packs/day: 0.00    Years: 40.00    Types: E-cigarettes    Quit date: 11/03/2015  . Smokeless tobacco: Former Systems developer  . Alcohol use No  . Drug use: No     Comment: 04/29/2015 "last drug use was ~ 09/08/2013"  . Sexual activity: No   Other Topics Concern  . Not on file   Social History  Narrative   Lives in [place], in [place] with sister.  They are looking to move but don't have a place to go yet.      Physical Exam      SAFE - 12/27/16 1100      Situation   Admitting diagnosis chf   Heart failure history Exisiting   Comorbidities DM;HTN;Poor Wound Healing;Sleep Apnea;CKD/renal insufficiency;Anemia;Atrial fibillation;Hx DVT/PE;Hx MI/CAD   Readmitted within 30 days No     Assessment   Lives alone No   Primary support person sister-billie   Mode of transportation personal car;family/friends   Other services involved None   Home equipement Scale;Wheelchair  Weight   Weighs self daily Yes   Scale provided No  pt has own    Records on weight chart Yes     Resources   Has "Living better w/heart failure" book Yes   Has HF Zone tool Yes   Able to identify yellow zone signs/when to call MD Yes   Records zone daily No     Medications   Uses a pill box Yes   Who stocks the pill box paramedicine   Pill box checked this visit Yes   Pill box refilled this visit Yes   Difficulty obtaining medications No   Mail order medications Yes   New medications at home today No   Next scheduled delivery of medications 12/29/16   Missed one or more doses of medications per week No     Nutrition   Patient receives meals on wheels No   Patient follows low sodium diet No   Has foods at home that meet the current recommended diet Yes   Patient follows low sugar/card diet No   Nutritional concerns/issues y     Activity Level   ADL's/Mobility Independent   How many feet can patient ambulate 81f   Typical activity level poor mobility   Barriers reading/pain to legs   Actvity tolerance: NYHA Class 3     Urine   Difficulty urinating No   Changes in urine None     Time spent with patient   Time spent with patient  60 Minutes        Future Appointments Date Time Provider [place] 01/19/2017 10:00 AM [facility] PA/NP [facility] None  01/20/2017 11:30 AM [facility]   BP 140/72 (Patient Position: Sitting)   Pulse 82   Resp 16   Wt 266 lb (120.7 kg)   LMP 11/27/2012   SpO2 97%   BMI 45.66 kg/m     Weight yesterday-265 Last visit weight-266 CBG-524   Pt reports her legs are hurting very bad today. Her sister went to p/u spiro at [facility] today but they told her she had to wait a few more days but when I called the pharmacy they said the insurance was blocking it due to it being said it was p/u on 2/15 and it actually was sent in to her mail order pharmacy but it was wrote for 1/2 tab daily with only 15 pills so I contacted EDoroteo Bradfordto send in the [facility].  Pt reports her breathing is doing ok. She will see the wound clinic in [place] on 2/22 for follow up on her legs.  meds verified and pill box filled. No missed doses this week.  [facility]ed [facility] and got the metolazone, bG86and folic acid to be brought in since those werent sent in with this shipment. She still does not have the cymbalta- left a message at the PCP office [facility] about that. Pt reports her CBG was 504 @ 115 pm today-she states she took her insulin and had not missed any doses of that. She does not recall any sugary foods she ate recently. Pt denies any dizziness, no h/a. Pt reports she feels more sleepy than usual. She is caoX3, no lethargy noted.  Her CBG was quite elevated-she took her 44U of novolin during our visit.she states she will check it again before supper and watch what she is eating. Pt reports good urination, denies nosebleeds, no blood in stool. She states  she is having increased indigestion and its happening every day-she is having burning pains in the middle of her chest. Due to her age and hx I did a 12lead on her- no acute changes noted. Advised her if it got worse then to contact doctor.  She is aware of she has been approved for use of the Y program.  Her legs are swollen and tight but  pt states its not more than usual and her left leg is leaking.    Marylouise Stacks, EMT-Paramedic  01/03/17   ACTION: Home visit completed Next visit planned for 1 week

## 2017-01-05 ENCOUNTER — Other Ambulatory Visit: Payer: Self-pay | Admitting: Internal Medicine

## 2017-01-05 ENCOUNTER — Other Ambulatory Visit (HOSPITAL_COMMUNITY): Payer: Self-pay | Admitting: Cardiology

## 2017-01-05 DIAGNOSIS — Z1231 Encounter for screening mammogram for malignant neoplasm of breast: Secondary | ICD-10-CM

## 2017-01-05 MED ORDER — SPIRONOLACTONE 25 MG PO TABS
25.0000 mg | ORAL_TABLET | Freq: Every day | ORAL | 3 refills | Status: DC
Start: 2017-01-05 — End: 2017-03-23

## 2017-01-06 ENCOUNTER — Other Ambulatory Visit (HOSPITAL_COMMUNITY): Payer: Self-pay | Admitting: Pharmacist

## 2017-01-10 ENCOUNTER — Other Ambulatory Visit (HOSPITAL_COMMUNITY): Payer: Self-pay

## 2017-01-10 NOTE — Progress Notes (Signed)
Paramedicine Encounter    Patient ID: [name], female    DOB: 1961/02/05, 56 y.o.   MRN: [id number]   Patient Care Team: Sandi Mariscal, MD as PCP - General (Internal Medicine) [name], MD as Consulting Physician (Cardiology) [name], MD as Consulting Physician (Pulmonary Disease)  Patient Active Problem List   Diagnosis Date Noted  . CKD (chronic kidney disease), stage III 11/24/2016  . Suspected sleep apnea 11/24/2016  . Severe obesity (BMI >= 40) ([place]) 02/24/2016  . COPD GOLD 0  02/24/2016  . Chronic systolic CHF (congestive heart failure) ([place]) 02/12/2016  . Encounter for therapeutic drug monitoring 02/10/2016  . Symptomatic bradycardia 01/12/2016  . Essential hypertension 12/22/2015  . Wheeze   . 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 Tobacco abuse 10/23/2015  . Coronary artery disease involving native coronary artery of native heart   . DOE (dyspnea on exertion) 04/29/2015  . PAF (paroxysmal atrial fibrillation) ([place]) 01/16/2015  . Carotid arterial disease ([place]) 01/16/2015  . Claudication ([place]) 01/15/2015  . Demand ischemia ([place]) 10/29/2014  . Insomnia 02/03/2014  . S/P peripheral artery angioplasty - TurboHawk atherectomy; R SFA 09/11/2013    Class: Acute  . Leg pain, bilateral 08/19/2013  . Hypothyroidism 07/31/2013  . Cellulitis 06/13/2013  . History of cocaine abuse 06/13/2013  . Long term current use of anticoagulant therapy 05/20/2013  . Alcohol abuse   . Narcotic abuse   . Marijuana abuse   . Alcoholic cirrhosis ([place])   . DM (diabetes mellitus), type 2 with peripheral vascular complications ([name])     Current Outpatient Prescriptions:  .  albuterol (PROVENTIL HFA;VENTOLIN HFA) 108 (90 Base) MCG/ACT inhaler, Inhale 2 puffs into the lungs every 6 (six) hours as needed for wheezing or shortness of breath., Disp: 1 each, Rfl: 3 .  albuterol (PROVENTIL) (2.5 MG/3ML) 0.083% nebulizer solution, Take 3 mLs (2.5 mg total) by nebulization every 6 (six)  hours as needed. For wheezing., Disp: 75 mL, Rfl: 2 .  amiodarone (PACERONE) 100 MG tablet, TAKE 1 TABLET BY MOUTH ONCE DAILY, Disp: 30 tablet, Rfl: 10 .  amLODipine (NORVASC) 10 MG tablet, Take 10 mg by mouth daily., Disp: , Rfl:  .  atorvastatin (LIPITOR) 40 MG tablet, Take 1 tablet (40 mg total) by mouth every evening., Disp: 21 tablet, Rfl: 0 .  bisoprolol (ZEBETA) 5 MG tablet, TAKE 1/2 TABLET BY MOUTH EVERY DAY, Disp: 15 tablet, Rfl: 2 .  Blood Glucose Monitoring Suppl (ACCU-CHEK AVIVA PLUS) W/DEVICE KIT, 1 Device by Does not apply route 4 (four) times daily -  before meals and at bedtime., Disp: 1 kit, Rfl: 0 .  cholecalciferol (VITAMIN D) 1000 units tablet, Take 1,000 Units by mouth daily., Disp: , Rfl:  .  folic acid (FOLVITE) 1 MG tablet, TAKE 1 TABLET BY MOUTH EVERY DAY, Disp: 90 tablet, Rfl: 1 .  gabapentin (NEURONTIN) 600 MG tablet, Take 300 mg by mouth at bedtime. , Disp: , Rfl:  .  insulin aspart (NOVOLOG FLEXPEN) 100 UNIT/ML FlexPen, Inject 24-44 Units into the skin 3 (three) times daily with meals. , Disp: , Rfl:  .  Insulin Glargine (LANTUS SOLOSTAR) 100 UNIT/ML Solostar Pen, Inject 80 Units into the skin daily at 10 pm. , Disp: , Rfl:  .  Insulin Pen Needle (ULTICARE MICRO PEN NEEDLES) 32G X 4 MM MISC, 1 Syringe by Does not apply route 4 (four) times daily - after meals and at bedtime., Disp: 1 each, Rfl: 11 .  Lancets (  ACCU-CHEK SOFT TOUCH) lancets, Use as instructed, Disp: 100 each, Rfl: 12 .  levothyroxine (SYNTHROID, LEVOTHROID) 50 MCG tablet, Take 1 tablet (50 mcg total) by mouth daily., Disp: 30 tablet, Rfl: 2 .  losartan (COZAAR) 25 MG tablet, Take 1 tablet (25 mg total) by mouth daily., Disp: 21 tablet, Rfl: 0 .  metolazone (ZAROXOLYN) 2.5 MG tablet, Take 1 tablet (2.5 mg total) by mouth once a week. On Thursday. May take an additional 2.5 mg (1 tablet) as directed by HF clinic., Disp: 4 tablet, Rfl: 5 .  nitroGLYCERIN (NITROSTAT) 0.4 MG SL tablet, Place 1 tablet (0.4 mg  total) under the tongue every 5 (five) minutes as needed for chest pain., Disp: 25 tablet, Rfl: 3 .  potassium chloride SA (K-DUR,KLOR-CON) 20 MEQ tablet, Take 1 tablet (20 mEq total) by mouth daily. Take an additional 20 meq (1 tab) on Thursday with metolazone, Disp: 35 tablet, Rfl: 6 .  spironolactone (ALDACTONE) 25 MG tablet, Take 1 tablet (25 mg total) by mouth daily., Disp: 90 tablet, Rfl: 3 .  SYMBICORT 160-4.5 MCG/ACT inhaler, INHALE 2 PUFFS INTO THE LUNGS TWICE DAILY, Disp: 1 Inhaler, Rfl: 11 .  torsemide (DEMADEX) 20 MG tablet, Take 2 tablets (40 mg total) by mouth 2 (two) times daily., Disp: 120 tablet, Rfl: 3 .  warfarin (COUMADIN) 5 MG tablet, Take 1-1.5 tablets (5-7.5 mg total) by mouth daily. Take 1 tablet (5 mg) daily except 1 & 1/2 tablets (7.5 mg) on Mon, Wed and Fri, Disp: 30 tablet, Rfl: 0 .  cyanocobalamin 1000 MCG tablet, Take 1 tablet (1,000 mcg total) by mouth daily. (Patient not taking: Reported on 01/10/2017), Disp: 30 tablet, Rfl: 10 .  DULoxetine (CYMBALTA) 30 MG capsule, Take 30 mg by mouth daily., Disp: , Rfl:  Allergies  Allergen Reactions  . Gabapentin Nausea And Vomiting    POSSIBLE SHAKING? TAKING MED CURRENTLY     Social History   Social History  . Marital status: Single    Spouse name: N/A  . Number of children: N/A  . Years of education: N/A   Occupational History  . disabled    Social History Main Topics  . Smoking status: Former Smoker    Packs/day: 0.00    Years: 40.00    Types: E-cigarettes    Quit date: 11/03/2015  . Smokeless tobacco: Former Systems developer  . Alcohol use No  . Drug use: No     Comment: 04/29/2015 "last drug use was ~ 09/08/2013"  . Sexual activity: No   Other Topics Concern  . Not on file   Social History Narrative   Lives in [place], in [place] with sister.  They are looking to move but don't have a place to go yet.      Physical Exam  Eyes: Pupils are equal, round, and reactive to light.  Pulmonary/Chest: No respiratory  distress. She has no wheezes. She has no rales.  Abdominal: She exhibits no distension. There is no tenderness. There is no guarding.  Musculoskeletal: She exhibits no edema.  Skin: Skin is warm and dry. She is not diaphoretic.        SAFE - 12/27/16 1100      Situation   Admitting diagnosis chf   Heart failure history Exisiting   Comorbidities DM;HTN;Poor Wound Healing;Sleep Apnea;CKD/renal insufficiency;Anemia;Atrial fibillation;Hx DVT/PE;Hx MI/CAD   Readmitted within 30 days No     Assessment   Lives alone No   Primary support person sister-billie   Mode of transportation personal car;family/friends  Other services involved None   Home equipement Scale;Wheelchair     [employer]   Weighs self daily Yes   Scale provided No  pt has own    Records on weight chart Yes     Resources   Has "Living better w/heart failure" book Yes   Has HF Zone tool Yes   Able to identify yellow zone signs/when to call MD Yes   Records zone daily No     Medications   Uses a pill box Yes   Who stocks the pill box paramedicine   Pill box checked this visit Yes   Pill box refilled this visit Yes   Difficulty obtaining medications No   Mail order medications Yes   New medications at home today No   Next scheduled delivery of medications 12/29/16   Missed one or more doses of medications per week No     Nutrition   Patient receives meals on wheels No   Patient follows low sodium diet No   Has foods at home that meet the current recommended diet Yes   Patient follows low sugar/card diet No   Nutritional concerns/issues y     Activity Level   ADL's/Mobility Independent   How many feet can patient ambulate 52f   Typical activity level poor mobility   Barriers reading/pain to legs   Actvity tolerance: NYHA Class 3     Urine   Difficulty urinating No   Changes in urine None     Time spent with patient   Time spent with patient  60 Minutes        Future Appointments Date Time  Provider [place] 01/19/2017 10:00 AM [facility] PA/NP [facility] None  01/20/2017 11:30 AM [facility]  02/16/2017 2:50 PM [facility] MM 3 [facility]   ATF pt CAO x4 complaining of the sores on her feet and legs bothering her. pt stated that she is going to get a personal trainer on Friday to exercise. Pt denies dizziness, headache and chest pain today. Pt stated that she Cancelled wound care appointment last Thursday due to not feeling well. Pt is feeling a lot better today and is walking around more and not using the wheelchair as much. No abnormal swelling to pt's legs and feet.  rx bottles and pill box verified.   Out of cymbalta *coumadin 1.5 ( mon and fri)  Refills called in: SArlyce Harman(will be delivered Thursday)  BP 138/70 (BP Location: Left Arm, Patient Position: Sitting, Cuff Size: Large)   Pulse 74   Resp 16   Wt 260 lb (117.9 kg)   LMP 11/27/2012   SpO2 98%   BMI 44.63 kg/m   cbg 190  Weight yesterday-268 Last visit weight-266    ACTION: Home visit completed

## 2017-01-13 NOTE — Progress Notes (Signed)
[facility] Report   Patient Details  Name: [name] MRN: [id number] Date of Birth: [birth year] Age: 56 y.o. PCP: Sandi Mariscal, MD  Vitals:   01/13/17 1233  BP: 140/80  Pulse: 90  Resp: 20  SpO2: 98%  Weight: 262 lb 9.6 oz (119.1 kg)  Height: 5\' 4"  (1.626 m)         [name] - 01/13/17 1000      Referral    Referring Provider Dr. Haroldine Laws   Reason for referral Current Smoker;Diabetes;Family History;Heart Failure;High Cholesterol;Inactivity;Hypertension;Obesitity/Overweight;Orthopedic   Program Start Date 01/13/17     Information for Trainer   Goals "walk more, lose weight, get healthy, be on less meds & insulin"   Current Exercise "none"   Orthopedic Concerns neuropathy in feet, arthritis in knees & hips   Pertinent Medical History CHF, HTN, obesity, "bad liver, smokes 3-4 cigs/day, diabetes"   Current Barriers "pain in feet/legs, circulation problems, obesity, depression"   Restrictions/Precautions Fall risk;Assistive device     Timed Up and Go (TUGS)   Timed Up and Go High risk >13 seconds  18.76 secs     Mobility and Daily Activities   I find it easy to walk up or down two or more flights of stairs. 1   I have no trouble taking out the trash. 1  0   I do housework such as vacuuming and dusting on my own without difficulty. 1   I can easily lift a gallon of milk (8lbs). 4   I can easily walk a mile. 1  0   I have no trouble reaching into high cupboards or reaching down to pick up something from the floor. 2   I do not have trouble doing out-door work such as Armed forces logistics/support/administrative officer, raking leaves, or gardening. 1  0     Mobility and Daily Activities   I feel younger than my age. 1   I feel independent. 2   I feel energetic. 2   I live an active life.  1  0   I feel strong. 1  0   I feel healthy. 1  0   I feel active as other people my age. 1  0     How fit and strong are you.   Fit and Strong Total Score 20     Past Medical  History:  Diagnosis Date  . Alcohol abuse   . Alcoholic cirrhosis ([place])   . Alcoholic cirrhosis ([place])   . Anemia   . Anxiety   . B12 deficiency   . CAD (coronary artery disease)    a. cath 11/10/2014 LM nl, LAD min irregs, D1 30 ost, D2 50d, LCX 76m, OM1 80 p/m (1.5 mm vessel), OM2 49m, RCA nondom 33m-->med rx.. Demand ischemia in the setting of rapid a-fib.  . Cardiomyopathy ([place])   . Carotid artery disease ([place])    a. 01/2015 Carotid Angio: RICA 093, LICA 81W. s/p L carotid endarterectomy 02/2015.  [name] Chronic combined systolic and diastolic CHF (congestive heart failure) ([place])    a. Last echo 12/205: EF 40-45%, inferoapical/posterior HK, not technically sufficient to allow eval of LV diastolic function, normal LA in size..  . Cocaine abuse   . COPD (chronic obstructive pulmonary disease) ([place])   . Critical lower limb ischemia   . Diabetic peripheral neuropathy ([place])   . Elevated troponin    a. Chronic elevation.  [name] GERD (gastroesophageal reflux disease)   . Hyperlipemia   .  Hypertension   . Hypokalemia   . Hypomagnesemia   . Hypothyroidism   . Marijuana abuse   . Narcotic abuse   . Noncompliance   . NSVT (nonsustained ventricular tachycardia) ([place])   . Obesity   . PAF (paroxysmal atrial fibrillation) ([place])   . Paroxysmal atrial tachycardia ([place])   . Paroxysmal atrial tachycardia ([place])   . Peripheral arterial disease ([place])    a. 01/2015 Angio/PTA: LSFA 100 w/ recon @ adductor canal and 1 vessel runoff via AT, RSFA 99 (atherectomy/pta) - 1 vessel runoff via diff dzs peroneal.  . Poorly controlled type 2 diabetes mellitus ([place])   . Renal insufficiency    a. 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  [name] Symptomatic bradycardia    avoid AV blocking agent per EP  . Tobacco abuse    Past Surgical History:  Procedure Laterality Date  . BALLOON ANGIOPLASTY, ARTERY Right 01/15/2015   SFA/notes 01/15/2015  . CARDIAC CATHETERIZATION    . CARDIAC CATHETERIZATION N/A 01/12/2016   Procedure: Temporary Wire;   Surgeon: Minus Breeding, MD;  Location: [facility];  Service: Cardiovascular;  Laterality: N/A;  . CARDIOVERSION  ~ 02/2013   "twice"   . CAROTID ANGIOGRAM N/A 01/15/2015   Procedure: CAROTID ANGIOGRAM;  Surgeon: [name], MD;  Location[facility] CATH LAB;  Service: Cardiovascular;  Laterality: N/A;  . DILATION AND CURETTAGE OF UTERUS  1988  . ENDARTERECTOMY Left 02/19/2015   Procedure: LEFT CAROTID ENDARTERECTOMY ;  Surgeon: Serafina Mitchell, MD;  Location: [facility];  Service: Vascular;  Laterality: Left;  . LEFT HEART CATHETERIZATION WITH CORONARY ANGIOGRAM N/A 10/31/2014   Procedure: LEFT HEART CATHETERIZATION WITH CORONARY ANGIOGRAM;  Surgeon: Burnell Blanks, MD;  Location[facility] C[facility] LAB;  Service: Cardiovascular;  Laterality: N/A;  . LOWER EXTREMITY ANGIOGRAM N/A 09/10/2013   Procedure: LOWER EXTREMITY ANGIOGRAM;  Surgeon: [name], MD;  Location: [facility] CATH LAB;  Service: Cardiovascular;  Laterality: N/A;  . LOWER EXTREMITY ANGIOGRAM N/A 01/15/2015   Procedure: LOWER EXTREMITY ANGIOGRAM;  Surgeon: [name], MD;  Location: [facility] CATH LAB;  Service: Cardiovascular;  Laterality: N/A;  . PERIPHERAL ATHRECTOMY Right 01/15/2015   SFA/notes 01/15/2015   History  Smoking Status  . Former Smoker  . Packs/day: 0.00  . Years: 40.00  . Types: E-cigarettes  . Quit date: 11/03/2015  Smokeless Tobacco  . 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  She has registered along with her sister she lives with.  They have discussed being accountable to one another and coming to [name] on Monday from 4:30-5:30pm for PREP class and to meet with their trainer every other week either before or after the class.  They also talked about [facility] their home on the other days of the week.  I will monitor them weekly as well as teach them nutrition basics in class, help them navigate exercise classes that are appropriate for them,  coach and encourage them.  They have also agreed to using the pool as that will help provide cardiovascular activity without the impact and pain to Jeanita's feet/legs.   Vanita Ingles 01/13/2017, 12:41 PM

## 2017-01-18 ENCOUNTER — Other Ambulatory Visit (HOSPITAL_COMMUNITY): Payer: Self-pay

## 2017-01-18 NOTE — Progress Notes (Signed)
Paramedicine Encounter    Patient ID: [name], female    DOB: 02/10/1961, 56 y.o.   MRN: [id number]   Patient Care Team: Sandi Mariscal, MD as PCP - General (Internal Medicine) [name], MD as Consulting Physician (Cardiology) [name], MD as Consulting Physician (Pulmonary Disease)  Patient Active Problem List   Diagnosis Date Noted  . CKD (chronic kidney disease), stage III 11/24/2016  . Suspected sleep apnea 11/24/2016  . Severe obesity (BMI >= 40) ([place]) 02/24/2016  . COPD GOLD 0  02/24/2016  . Chronic systolic CHF (congestive heart failure) ([place]) 02/12/2016  . Encounter for therapeutic drug monitoring 02/10/2016  . Symptomatic bradycardia 01/12/2016  . Essential hypertension 12/22/2015  . Wheeze   . 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 Tobacco abuse 10/23/2015  . Coronary artery disease involving native coronary artery of native heart   . Price (dyspnea on exertion) 04/29/2015  . PAF (paroxysmal atrial fibrillation) ([place]) 01/16/2015  . Carotid arterial disease ([place]) 01/16/2015  . Claudication ([place]) 01/15/2015  . Demand ischemia ([place]) 10/29/2014  . Insomnia 02/03/2014  . S/P peripheral artery angioplasty - [place]bo[place] atherectomy; R SFA 09/11/2013    Class: Acute  . Leg pain, bilateral 08/19/2013  . Hypothyroidism 07/31/2013  . Cellulitis 06/13/2013  . History of cocaine abuse 06/13/2013  . Long term current use of anticoagulant therapy 05/20/2013  . Alcohol abuse   . Narcotic abuse   . Marijuana abuse   . Alcoholic cirrhosis ([place])   . DM (diabetes mellitus), type 2 with peripheral vascular complications ([name])     Current Outpatient Prescriptions:  .  albuterol (PROVENTIL HFA;VENTOLIN HFA) 108 (90 Base) MCG/ACT inhaler, Inhale 2 puffs into the lungs every 6 (six) hours as needed for wheezing or shortness of breath., Disp: 1 each, Rfl: 3 .  albuterol (PROVENTIL) (2.5 MG/3ML) 0.083% nebulizer solution, Take 3 mLs (2.5 mg total) by nebulization every 6 (six)  hours as needed. For wheezing., Disp: 75 mL, Rfl: 2 .  amiodarone (PACERONE) 100 MG tablet, TAKE 1 TABLET BY MOUTH ONCE DAILY, Disp: 30 tablet, Rfl: 10 .  amLODipine (NORVASC) 10 MG tablet, Take 10 mg by mouth daily., Disp: , Rfl:  .  atorvastatin (LIPITOR) 40 MG tablet, Take 1 tablet (40 mg total) by mouth every evening., Disp: 21 tablet, Rfl: 0 .  bisoprolol (ZEBETA) 5 MG tablet, TAKE 1/2 TABLET BY MOUTH EVERY DAY, Disp: 15 tablet, Rfl: 2 .  Blood Glucose Monitoring Suppl (ACCU-CHEK AVIVA PLUS) W/DEVICE KIT, 1 Device by Does not apply route 4 (four) times daily -  before meals and at bedtime., Disp: 1 kit, Rfl: 0 .  cholecalciferol (VITAMIN D) 1000 units tablet, Take 1,000 Units by mouth daily., Disp: , Rfl:  .  cyanocobalamin 1000 MCG tablet, Take 1 tablet (1,000 mcg total) by mouth daily., Disp: 30 tablet, Rfl: 10 .  DULoxetine (CYMBALTA) 30 MG capsule, Take 30 mg by mouth daily., Disp: , Rfl:  .  folic acid (FOLVITE) 1 MG tablet, TAKE 1 TABLET BY MOUTH EVERY DAY, Disp: 90 tablet, Rfl: 1 .  gabapentin (NEURONTIN) 600 MG tablet, Take 300 mg by mouth at bedtime. , Disp: , Rfl:  .  insulin aspart (NOVOLOG FLEXPEN) 100 UNIT/ML FlexPen, Inject 24-44 Units into the skin 3 (three) times daily with meals. , Disp: , Rfl:  .  Insulin Glargine (LANTUS SOLOSTAR) 100 UNIT/ML Solostar Pen, Inject 80 Units into the skin daily at 10 pm. , Disp: , Rfl:  .  Insulin Pen Needle (ULTICARE MICRO PEN NEEDLES) 32G X 4 MM MISC, 1 Syringe by Does not apply route 4 (four) times daily - after meals and at bedtime., Disp: 1 each, Rfl: 11 .  Lancets (ACCU-CHEK SOFT TOUCH) lancets, Use as instructed, Disp: 100 each, Rfl: 12 .  levothyroxine (SYNTHROID, LEVOTHROID) 50 MCG tablet, Take 1 tablet (50 mcg total) by mouth daily., Disp: 30 tablet, Rfl: 2 .  losartan (COZAAR) 25 MG tablet, Take 1 tablet (25 mg total) by mouth daily., Disp: 21 tablet, Rfl: 0 .  metolazone (ZAROXOLYN) 2.5 MG tablet, Take 1 tablet (2.5 mg total) by  mouth once a week. On Thursday. May take an additional 2.5 mg (1 tablet) as directed by HF clinic., Disp: 4 tablet, Rfl: 5 .  nitroGLYCERIN (NITROSTAT) 0.4 MG SL tablet, Place 1 tablet (0.4 mg total) under the tongue every 5 (five) minutes as needed for chest pain., Disp: 25 tablet, Rfl: 3 .  potassium chloride SA (K-DUR,KLOR-CON) 20 MEQ tablet, Take 1 tablet (20 mEq total) by mouth daily. Take an additional 20 meq (1 tab) on Thursday with metolazone, Disp: 35 tablet, Rfl: 6 .  spironolactone (ALDACTONE) 25 MG tablet, Take 1 tablet (25 mg total) by mouth daily., Disp: 90 tablet, Rfl: 3 .  SYMBICORT 160-4.5 MCG/ACT inhaler, INHALE 2 PUFFS INTO THE LUNGS TWICE DAILY, Disp: 1 Inhaler, Rfl: 11 .  torsemide (DEMADEX) 20 MG tablet, Take 2 tablets (40 mg total) by mouth 2 (two) times daily., Disp: 120 tablet, Rfl: 3 .  warfarin (COUMADIN) 5 MG tablet, Take 1-1.5 tablets (5-7.5 mg total) by mouth daily. Take 1 tablet (5 mg) daily except 1 & 1/2 tablets (7.5 mg) on Mon, Wed and Fri, Disp: 30 tablet, Rfl: 0 Allergies  Allergen Reactions  . Gabapentin Nausea And Vomiting    POSSIBLE SHAKING? TAKING MED CURRENTLY     Social History   Social History  . Marital status: Single    Spouse name: N/A  . Number of children: N/A  . Years of education: N/A   Occupational History  . disabled    Social History Main Topics  . Smoking status: Former Smoker    Packs/day: 0.00    Years: 40.00    Types: E-cigarettes    Quit date: 11/03/2015  . Smokeless tobacco: Former Systems developer  . Alcohol use No  . Drug use: No     Comment: 04/29/2015 "last drug use was ~ 09/08/2013"  . Sexual activity: No   Other Topics Concern  . Not on file   Social History Narrative   Lives in [place], in [place] with sister.  They are looking to move but don't have a place to go yet.      Physical Exam  Constitutional: She is oriented to person, place, and time.  Neck: Normal range of motion.  Cardiovascular: Normal rate, regular rhythm  and intact distal pulses.   Pulmonary/Chest: Effort normal. No respiratory distress. She has no wheezes. She has no rales.  Musculoskeletal: She exhibits edema.  Neurological: She is oriented to person, place, and time.        [facility] - 12/27/16 1100      Situation   Admitting diagnosis chf   Heart failure history Exisiting   Comorbidities DM;HTN;Poor Wound Healing;Sleep Apnea;CKD/renal insufficiency;Anemia;Atrial fibillation;Hx DVT/PE;Hx MI/CAD   Readmitted within 30 days No     Assessment   Lives alone No   Primary support person sister-billie   Mode of transportation personal car;family/friends   Other services  involved None   Home equipement Scale;Wheelchair     Weight   Weighs self daily Yes   Scale provided No  pt has own    Records on weight chart Yes     Resources   Has "Living better w/heart failure" book Yes   Has HF Zone tool Yes   Able to identify yellow zone signs/when to call MD Yes   Records zone daily No     Medications   Uses a pill box Yes   Who stocks the pill box paramedicine   Pill box checked this visit Yes   Pill box refilled this visit Yes   Difficulty obtaining medications No   Mail order medications Yes   New medications at home today No   Next scheduled delivery of medications 12/29/16   Missed one or more doses of medications per week No     Nutrition   Patient receives meals on wheels No   Patient follows low sodium diet No   Has foods at home that meet the current recommended diet Yes   Patient follows low sugar/card diet No   Nutritional concerns/issues y     Activity Level   ADL's/Mobility Independent   How many feet can patient ambulate 91f   Typical activity level poor mobility   Barriers reading/pain to legs   Actvity tolerance: NYHA Class 3     Urine   Difficulty urinating No   Changes in urine None     Time spent with patient   Time spent with patient  60 Minutes        Future Appointments Date Time Provider  [place] 01/20/2017 11:30 AM [facility]  01/23/2017 2:00 PM [facility] PA/NP [facility] None  02/16/2017 2:50 PM [facility] MM 3 [facility]   LMP 11/27/2012  Weight yesterday-266.2 Last visit weight-260 CBG-397  Ms. HArrasis seen at home today and reports having bilateral lower leg discomfort. She has wounds noted to both legs near the ankle. She has been seen at the wound clinic but canceled her most recent appointment. She was advised to call the clinic and reschedule ASAP.She denied SOB,H/A or dizziness. Her legs were wrapped to keep the wounds clean until she could be seen. Her medications were verified and her pillbox was filled.    [name] EMT-Paramedic [name] EMT-Paramedic  01/18/17  ACTION: Home visit completed Next visit planned for 1 week

## 2017-01-19 ENCOUNTER — Encounter (HOSPITAL_COMMUNITY): Payer: Medicare Other

## 2017-01-20 ENCOUNTER — Ambulatory Visit (INDEPENDENT_AMBULATORY_CARE_PROVIDER_SITE_OTHER): Payer: Medicare Other | Admitting: Pharmacist

## 2017-01-20 DIAGNOSIS — I4891 Unspecified atrial fibrillation: Secondary | ICD-10-CM | POA: Diagnosis not present

## 2017-01-20 DIAGNOSIS — Z5181 Encounter for therapeutic drug level monitoring: Secondary | ICD-10-CM | POA: Diagnosis not present

## 2017-01-20 DIAGNOSIS — I480 Paroxysmal atrial fibrillation: Secondary | ICD-10-CM | POA: Diagnosis not present

## 2017-01-20 LAB — POCT INR: INR: 2

## 2017-01-20 NOTE — Progress Notes (Signed)
[facility] PREP Weekly Session   Patient Details  Name: [name] MRN: [id number] Date of Birth: 1961-01-05 Age: 56 y.o. PCP: Sandi Mariscal, MD  There were no vitals filed for this visit.      [name] Weekly seesion - 01/20/17 0900      Weekly Session   Topic Discussed Restaurant Eating   Classes attended to date 1   Comments First PREP class actually on 01/16/17       Vanita Ingles 01/20/2017, 9:38 AM

## 2017-01-23 ENCOUNTER — Encounter (HOSPITAL_COMMUNITY): Payer: Medicare Other

## 2017-01-25 ENCOUNTER — Other Ambulatory Visit (HOSPITAL_COMMUNITY): Payer: Self-pay

## 2017-01-25 NOTE — Progress Notes (Signed)
Paramedicine Encounter    Patient ID: [name], female    DOB: 1961/09/13, 56 y.o.   MRN: [id number]   Patient Care Team: Sandi Mariscal, MD as PCP - General (Internal Medicine) [name], MD as Consulting Physician (Cardiology) [name], MD as Consulting Physician (Pulmonary Disease)  Patient Active Problem List   Diagnosis Date Noted  . CKD (chronic kidney disease), stage III 11/24/2016  . Suspected sleep apnea 11/24/2016  . Severe obesity (BMI >= 40) ([place]) 02/24/2016  . COPD GOLD 0  02/24/2016  . Chronic systolic CHF (congestive heart failure) ([place]) 02/12/2016  . Encounter for therapeutic drug monitoring 02/10/2016  . Symptomatic bradycardia 01/12/2016  . Essential hypertension 12/22/2015  . Wheeze   . 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 Tobacco abuse 10/23/2015  . Coronary artery disease involving native coronary artery of native heart   . DOE (dyspnea on exertion) 04/29/2015  . PAF (paroxysmal atrial fibrillation) ([place]) 01/16/2015  . Carotid arterial disease ([place]) 01/16/2015  . Claudication ([place]) 01/15/2015  . Demand ischemia ([place]) 10/29/2014  . Insomnia 02/03/2014  . S/P peripheral artery angioplasty - TurboHawk atherectomy; R SFA 09/11/2013    Class: Acute  . Leg pain, bilateral 08/19/2013  . Hypothyroidism 07/31/2013  . Cellulitis 06/13/2013  . History of cocaine abuse 06/13/2013  . Long term current use of anticoagulant therapy 05/20/2013  . Alcohol abuse   . Narcotic abuse   . Marijuana abuse   . Alcoholic cirrhosis ([place])   . DM (diabetes mellitus), type 2 with peripheral vascular complications ([name])     Current Outpatient Prescriptions:  .  albuterol (PROVENTIL HFA;VENTOLIN HFA) 108 (90 Base) MCG/ACT inhaler, Inhale 2 puffs into the lungs every 6 (six) hours as needed for wheezing or shortness of breath., Disp: 1 each, Rfl: 3 .  albuterol (PROVENTIL) (2.5 MG/3ML) 0.083% nebulizer solution, Take 3 mLs (2.5 mg total) by nebulization every 6 (six)  hours as needed. For wheezing., Disp: 75 mL, Rfl: 2 .  amiodarone (PACERONE) 100 MG tablet, TAKE 1 TABLET BY MOUTH ONCE DAILY, Disp: 30 tablet, Rfl: 10 .  amLODipine (NORVASC) 10 MG tablet, Take 10 mg by mouth daily., Disp: , Rfl:  .  atorvastatin (LIPITOR) 40 MG tablet, Take 1 tablet (40 mg total) by mouth every evening., Disp: 21 tablet, Rfl: 0 .  bisoprolol (ZEBETA) 5 MG tablet, TAKE 1/2 TABLET BY MOUTH EVERY DAY, Disp: 15 tablet, Rfl: 2 .  Blood Glucose Monitoring Suppl (ACCU-CHEK AVIVA PLUS) W/DEVICE KIT, 1 Device by Does not apply route 4 (four) times daily -  before meals and at bedtime., Disp: 1 kit, Rfl: 0 .  cholecalciferol (VITAMIN D) 1000 units tablet, Take 1,000 Units by mouth daily., Disp: , Rfl:  .  cyanocobalamin 1000 MCG tablet, Take 1 tablet (1,000 mcg total) by mouth daily., Disp: 30 tablet, Rfl: 10 .  folic acid (FOLVITE) 1 MG tablet, TAKE 1 TABLET BY MOUTH EVERY DAY, Disp: 90 tablet, Rfl: 1 .  gabapentin (NEURONTIN) 600 MG tablet, Take 300 mg by mouth at bedtime. , Disp: , Rfl:  .  insulin aspart (NOVOLOG FLEXPEN) 100 UNIT/ML FlexPen, Inject 24-44 Units into the skin 3 (three) times daily with meals. , Disp: , Rfl:  .  Insulin Glargine (LANTUS SOLOSTAR) 100 UNIT/ML Solostar Pen, Inject 80 Units into the skin daily at 10 pm. , Disp: , Rfl:  .  Insulin Pen Needle (ULTICARE MICRO PEN NEEDLES) 32G X 4 MM MISC, 1 Syringe by Does not  apply route 4 (four) times daily - after meals and at bedtime., Disp: 1 each, Rfl: 11 .  Lancets (ACCU-CHEK SOFT TOUCH) lancets, Use as instructed, Disp: 100 each, Rfl: 12 .  levothyroxine (SYNTHROID, LEVOTHROID) 50 MCG tablet, Take 1 tablet (50 mcg total) by mouth daily., Disp: 30 tablet, Rfl: 2 .  losartan (COZAAR) 25 MG tablet, Take 1 tablet (25 mg total) by mouth daily., Disp: 21 tablet, Rfl: 0 .  metolazone (ZAROXOLYN) 2.5 MG tablet, Take 1 tablet (2.5 mg total) by mouth once a week. On Thursday. May take an additional 2.5 mg (1 tablet) as directed by  HF clinic., Disp: 4 tablet, Rfl: 5 .  potassium chloride SA (K-DUR,KLOR-CON) 20 MEQ tablet, Take 1 tablet (20 mEq total) by mouth daily. Take an additional 20 meq (1 tab) on Thursday with metolazone, Disp: 35 tablet, Rfl: 6 .  spironolactone (ALDACTONE) 25 MG tablet, Take 1 tablet (25 mg total) by mouth daily., Disp: 90 tablet, Rfl: 3 .  SYMBICORT 160-4.5 MCG/ACT inhaler, INHALE 2 PUFFS INTO THE LUNGS TWICE DAILY, Disp: 1 Inhaler, Rfl: 11 .  torsemide (DEMADEX) 20 MG tablet, Take 2 tablets (40 mg total) by mouth 2 (two) times daily., Disp: 120 tablet, Rfl: 3 .  warfarin (COUMADIN) 5 MG tablet, Take 1-1.5 tablets (5-7.5 mg total) by mouth daily. Take 1 tablet (5 mg) daily except 1 & 1/2 tablets (7.5 mg) on Mon, Wed and Fri, Disp: 30 tablet, Rfl: 0 .  DULoxetine (CYMBALTA) 30 MG capsule, Take 30 mg by mouth daily., Disp: , Rfl:  .  nitroGLYCERIN (NITROSTAT) 0.4 MG SL tablet, Place 1 tablet (0.4 mg total) under the tongue every 5 (five) minutes as needed for chest pain. (Patient not taking: Reported on 01/18/2017), Disp: 25 tablet, Rfl: 3 Allergies  Allergen Reactions  . Gabapentin Nausea And Vomiting    POSSIBLE SHAKING? TAKING MED CURRENTLY     Social History   Social History  . Marital status: Single    Spouse name: N/A  . Number of children: N/A  . Years of education: N/A   Occupational History  . disabled    Social History Main Topics  . Smoking status: Former Smoker    Packs/day: 0.00    Years: 40.00    Types: E-cigarettes    Quit date: 11/03/2015  . Smokeless tobacco: Former Neurosurgeon  . Alcohol use No  . Drug use: No     Comment: 04/29/2015 "last drug use was ~ 09/08/2013"  . Sexual activity: No   Other Topics Concern  . Not on file   Social History Narrative   Lives in [place], in motel with sister.  They are looking to move but don't have a place to go yet.      Physical Exam  Constitutional: She is oriented to person, place, and time. She appears well-developed.  Neck:  Normal range of motion.  Cardiovascular: Normal rate.   Pulmonary/Chest: Effort normal and breath sounds normal. No respiratory distress. She has no wheezes. She has no rales.  Abdominal: Soft.  Musculoskeletal: She exhibits edema.  Edema to both legs with sores and weeping noted  Neurological: She is oriented to person, place, and time.  Skin: Skin is warm and dry.  Psychiatric: She has a normal mood and affect.        SAFE - 12/27/16 1100      Situation   Admitting diagnosis chf   Heart failure history Exisiting   Comorbidities DM;HTN;Poor Wound Healing;Sleep Apnea;CKD/renal insufficiency;Anemia;Atrial fibillation;Hx  DVT/PE;Hx MI/CAD   Readmitted within 30 days No     Assessment   Lives alone No   Primary support person sister-billie   Mode of transportation personal car;family/friends   Other services involved None   Home equipement Scale;Wheelchair     Weight   Weighs self daily Yes   Scale provided No  pt has own    Records on weight chart Yes     Resources   Has "Living better w/heart failure" book Yes   Has HF Zone tool Yes   Able to identify yellow zone signs/when to call MD Yes   Records zone daily No     Medications   Uses a pill box Yes   Who stocks the pill box paramedicine   Pill box checked this visit Yes   Pill box refilled this visit Yes   Difficulty obtaining medications No   Mail order medications Yes   New medications at home today No   Next scheduled delivery of medications 12/29/16   Missed one or more doses of medications per week No     Nutrition   Patient receives meals on wheels No   Patient follows low sodium diet No   Has foods at home that meet the current recommended diet Yes   Patient follows low sugar/card diet No   Nutritional concerns/issues y     Activity Level   ADL's/Mobility Independent   How many feet can patient ambulate 54f   Typical activity level poor mobility   Barriers reading/pain to legs   Actvity tolerance:  NYHA Class 3     Urine   Difficulty urinating No   Changes in urine None     Time spent with patient   Time spent with patient  60 Minutes        Future Appointments Date Time Provider DWalthall 02/06/2017 11:30 AM [facility] PA/NP [facility] None  02/16/2017 2:50 PM [facility] MM 3 [facility]  02/17/2017 12:30 PM [facility]   BP 120/64   Pulse 79   Resp 16   Wt 268 lb (121.6 kg)   LMP 11/27/2012   SpO2 96%   BMI 46.00 kg/m  Weight yesterday-268 Last visit weight-266 CBG-470  Ms. HFordis seen at home today and reports feeling well. She exhibits edema in both lower extremities which is an ongoing issue. She has an appointment at the wound clinic early next week to be evaluated for multiple sites on her lower legs. Ms. HFedorkodenies dizziness, headache or SOB. Medications were verified and pillbox was refilled. Medications ordered from pharmacy.   ZJacquiline Doe EMT-Paramedic KMarylouise Stacks EMT-Paramedic 01/25/17  ACTION: Home visit completed Next visit planned for 1 week

## 2017-01-26 ENCOUNTER — Other Ambulatory Visit (HOSPITAL_COMMUNITY): Payer: Self-pay | Admitting: Adult Health

## 2017-01-26 ENCOUNTER — Other Ambulatory Visit: Payer: Self-pay | Admitting: Cardiovascular Disease

## 2017-02-02 ENCOUNTER — Other Ambulatory Visit (HOSPITAL_COMMUNITY): Payer: Self-pay

## 2017-02-02 ENCOUNTER — Encounter (HOSPITAL_COMMUNITY): Payer: Self-pay

## 2017-02-02 NOTE — Telephone Encounter (Signed)
Created in error

## 2017-02-02 NOTE — Progress Notes (Signed)
Paramedicine Encounter    Patient ID: [name], female    DOB: 1961/09/13, 56 y.o.   MRN: [id number]   Patient Care Team: Sandi Mariscal, MD as PCP - General (Internal Medicine) [name], MD as Consulting Physician (Cardiology) [name], MD as Consulting Physician (Pulmonary Disease)  Patient Active Problem List   Diagnosis Date Noted  . CKD (chronic kidney disease), stage III 11/24/2016  . Suspected sleep apnea 11/24/2016  . Severe obesity (BMI >= 40) ([place]) 02/24/2016  . COPD GOLD 0  02/24/2016  . Chronic systolic CHF (congestive heart failure) ([place]) 02/12/2016  . Encounter for therapeutic drug monitoring 02/10/2016  . Symptomatic bradycardia 01/12/2016  . Essential hypertension 12/22/2015  . Wheeze   . 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 Tobacco abuse 10/23/2015  . Coronary artery disease involving native coronary artery of native heart   . DOE (dyspnea on exertion) 04/29/2015  . PAF (paroxysmal atrial fibrillation) ([place]) 01/16/2015  . Carotid arterial disease ([place]) 01/16/2015  . Claudication ([place]) 01/15/2015  . Demand ischemia ([place]) 10/29/2014  . Insomnia 02/03/2014  . S/P peripheral artery angioplasty - TurboHawk atherectomy; R SFA 09/11/2013    Class: Acute  . Leg pain, bilateral 08/19/2013  . Hypothyroidism 07/31/2013  . Cellulitis 06/13/2013  . History of cocaine abuse 06/13/2013  . Long term current use of anticoagulant therapy 05/20/2013  . Alcohol abuse   . Narcotic abuse   . Marijuana abuse   . Alcoholic cirrhosis ([place])   . DM (diabetes mellitus), type 2 with peripheral vascular complications ([name])     Current Outpatient Prescriptions:  .  albuterol (PROVENTIL HFA;VENTOLIN HFA) 108 (90 Base) MCG/ACT inhaler, Inhale 2 puffs into the lungs every 6 (six) hours as needed for wheezing or shortness of breath., Disp: 1 each, Rfl: 3 .  albuterol (PROVENTIL) (2.5 MG/3ML) 0.083% nebulizer solution, Take 3 mLs (2.5 mg total) by nebulization every 6 (six)  hours as needed. For wheezing., Disp: 75 mL, Rfl: 2 .  amiodarone (PACERONE) 100 MG tablet, TAKE 1 TABLET BY MOUTH ONCE DAILY, Disp: 30 tablet, Rfl: 10 .  amLODipine (NORVASC) 10 MG tablet, Take 10 mg by mouth daily., Disp: , Rfl:  .  atorvastatin (LIPITOR) 40 MG tablet, Take 1 tablet (40 mg total) by mouth every evening., Disp: 21 tablet, Rfl: 0 .  bisoprolol (ZEBETA) 5 MG tablet, TAKE 1/2 TABLET BY MOUTH EVERY DAY, Disp: 15 tablet, Rfl: 2 .  Blood Glucose Monitoring Suppl (ACCU-CHEK AVIVA PLUS) W/DEVICE KIT, 1 Device by Does not apply route 4 (four) times daily -  before meals and at bedtime., Disp: 1 kit, Rfl: 0 .  cholecalciferol (VITAMIN D) 1000 units tablet, Take 1,000 Units by mouth daily., Disp: , Rfl:  .  cyanocobalamin 1000 MCG tablet, Take 1 tablet (1,000 mcg total) by mouth daily., Disp: 30 tablet, Rfl: 10 .  folic acid (FOLVITE) 1 MG tablet, TAKE 1 TABLET BY MOUTH EVERY DAY, Disp: 90 tablet, Rfl: 1 .  gabapentin (NEURONTIN) 600 MG tablet, Take 300 mg by mouth at bedtime. , Disp: , Rfl:  .  insulin aspart (NOVOLOG FLEXPEN) 100 UNIT/ML FlexPen, Inject 24-44 Units into the skin 3 (three) times daily with meals. , Disp: , Rfl:  .  Insulin Glargine (LANTUS SOLOSTAR) 100 UNIT/ML Solostar Pen, Inject 80 Units into the skin daily at 10 pm. , Disp: , Rfl:  .  Insulin Pen Needle (ULTICARE MICRO PEN NEEDLES) 32G X 4 MM MISC, 1 Syringe by Does not  apply route 4 (four) times daily - after meals and at bedtime., Disp: 1 each, Rfl: 11 .  Lancets (ACCU-CHEK SOFT TOUCH) lancets, Use as instructed, Disp: 100 each, Rfl: 12 .  levothyroxine (SYNTHROID, LEVOTHROID) 50 MCG tablet, Take 1 tablet (50 mcg total) by mouth daily., Disp: 30 tablet, Rfl: 2 .  losartan (COZAAR) 25 MG tablet, Take 1 tablet (25 mg total) by mouth daily., Disp: 21 tablet, Rfl: 0 .  metolazone (ZAROXOLYN) 2.5 MG tablet, TAKE 1 TABLET BY MOUTH ONCE A WEEK ON THURSDAY. MAY TAKE AN ADDITIONAL TABLET AS DIRECTED BY HF CLINIC., Disp: 5  tablet, Rfl: 4 .  potassium chloride SA (K-DUR,KLOR-CON) 20 MEQ tablet, Take 1 tablet (20 mEq total) by mouth daily. Take an additional 20 meq (1 tab) on Thursday with metolazone, Disp: 35 tablet, Rfl: 6 .  spironolactone (ALDACTONE) 25 MG tablet, Take 1 tablet (25 mg total) by mouth daily., Disp: 90 tablet, Rfl: 3 .  SYMBICORT 160-4.5 MCG/ACT inhaler, INHALE 2 PUFFS INTO THE LUNGS TWICE DAILY, Disp: 1 Inhaler, Rfl: 11 .  torsemide (DEMADEX) 20 MG tablet, Take 2 tablets (40 mg total) by mouth 2 (two) times daily., Disp: 120 tablet, Rfl: 3 .  warfarin (COUMADIN) 5 MG tablet, TAKE 1 TABLET TO 1 AND 1/2 TABLETS BY MOUTH EVERY DAY AS DIRECTED BY COUMADIN CLINIC, Disp: 45 tablet, Rfl: 2 .  DULoxetine (CYMBALTA) 30 MG capsule, Take 30 mg by mouth daily., Disp: , Rfl:  .  nitroGLYCERIN (NITROSTAT) 0.4 MG SL tablet, Place 1 tablet (0.4 mg total) under the tongue every 5 (five) minutes as needed for chest pain. (Patient not taking: Reported on 01/18/2017), Disp: 25 tablet, Rfl: 3 Allergies  Allergen Reactions  . Gabapentin Nausea And Vomiting    POSSIBLE SHAKING? TAKING MED CURRENTLY     Social History   Social History  . Marital status: Single    Spouse name: N/A  . Number of children: N/A  . Years of education: N/A   Occupational History  . disabled    Social History Main Topics  . Smoking status: Former Smoker    Packs/day: 0.00    Years: 40.00    Types: E-cigarettes    Quit date: 11/03/2015  . Smokeless tobacco: Former Systems developer  . Alcohol use No  . Drug use: No     Comment: 04/29/2015 "last drug use was ~ 09/08/2013"  . Sexual activity: No   Other Topics Concern  . Not on file   Social History Narrative   Lives in [place], in [place] with sister.  They are looking to move but don't have a place to go yet.      Physical Exam      SAFE - 12/27/16 1100      Situation   Admitting diagnosis chf   Heart failure history Exisiting   Comorbidities DM;HTN;Poor Wound Healing;Sleep  Apnea;CKD/renal insufficiency;Anemia;Atrial fibillation;Hx DVT/PE;Hx MI/CAD   Readmitted within 30 days No     Assessment   Lives alone No   Primary support person sister-billie   Mode of transportation personal car;family/friends   Other services involved None   Home equipement Scale;Wheelchair     [employer]   Weighs self daily Yes   Scale provided No  pt has own    Records on weight chart Yes     Resources   Has "Living better w/heart failure" book Yes   Has HF Zone tool Yes   Able to identify yellow zone signs/when to call MD Yes  Records zone daily No     Medications   Uses a pill box Yes   Who stocks the pill box paramedicine   Pill box checked this visit Yes   Pill box refilled this visit Yes   Difficulty obtaining medications No   Mail order medications Yes   New medications at home today No   Next scheduled delivery of medications 12/29/16   Missed one or more doses of medications per week No     Nutrition   Patient receives meals on wheels No   Patient follows low sodium diet No   Has foods at home that meet the current recommended diet Yes   Patient follows low sugar/card diet No   Nutritional concerns/issues y     Activity Level   ADL's/Mobility Independent   How many feet can patient ambulate 42f   Typical activity level poor mobility   Barriers reading/pain to legs   Actvity tolerance: NYHA Class 3     Urine   Difficulty urinating No   Changes in urine None     Time spent with patient   Time spent with patient  60 Minutes        Future Appointments Date Time Provider DElk Garden 02/06/2017 11:30 AM [facility] PA/NP [facility] None  02/16/2017 2:50 PM [facility] MM 3 [facility]  02/17/2017 12:30 PM [facility]   BP 130/68   Pulse 74   Resp 16   Wt 270 lb (122.5 kg)   LMP 11/27/2012   SpO2 97%   BMI 46.35 kg/m  Weight yesterday-Monday-274 @ gym  Last visit weight-268 CBG- EMS-386  Pt has  her legs wrapped again-she [place] too bad-she states the sciatica nerve is what causes the pains.  Her weight continues to climb. Her legs are swollen, the right more than the left and its up to her knee. She reports that she goes back tomor to wound center to see about getting antibiotic-a culture was done on her wounds--her CBGs have continued to be elevated in the 300s-she states she cant go back to her diabetic doc as she owes them money and she doesn't have the money to see her pcp until the first of month.  She denies sob, no h/a, no dizziness.meds verified and pill box refilled.     ACTION: Home visit completed Next visit planned for next week    KMarylouise Stacks EMT-Paramedic  02/02/17

## 2017-02-03 NOTE — Progress Notes (Signed)
[facility] [employer] PREP Weekly Session   Patient Details  Name: [name] MRN: [id number] Date of Birth: 08-Mar-1961 Age: 56 y.o. PCP: Sandi Mariscal, MD  Vitals:   02/03/17 0919  Weight: 273 lb 6.4 oz (124 kg)        [name] Weekly seesion - 02/03/17 0900      Weekly Session   Topic Discussed Importance of resistance training   Classes attended to date 2       Vanita Ingles 02/03/2017, 9:20 AM

## 2017-02-06 ENCOUNTER — Ambulatory Visit (HOSPITAL_COMMUNITY)
Admission: RE | Admit: 2017-02-06 | Discharge: 2017-02-06 | Disposition: A | Discharge status: Home / Self Care | Payer: Medicare Other | Source: Ambulatory Visit | Attending: Internal Medicine | Admitting: Internal Medicine

## 2017-02-06 ENCOUNTER — Other Ambulatory Visit (HOSPITAL_COMMUNITY): Payer: Self-pay

## 2017-02-06 VITALS — BP 102/68 | HR 84 | Wt 265.4 lb

## 2017-02-06 DIAGNOSIS — I130 Hypertensive heart and chronic kidney disease with heart failure and stage 1 through stage 4 chronic kidney disease, or unspecified chronic kidney disease: Secondary | ICD-10-CM | POA: Insufficient documentation

## 2017-02-06 DIAGNOSIS — N183 Chronic kidney disease, stage 3 unspecified: Secondary | ICD-10-CM

## 2017-02-06 DIAGNOSIS — Z7901 Long term (current) use of anticoagulants: Secondary | ICD-10-CM | POA: Diagnosis not present

## 2017-02-06 DIAGNOSIS — I5022 Chronic systolic (congestive) heart failure: Secondary | ICD-10-CM

## 2017-02-06 DIAGNOSIS — E1122 Type 2 diabetes mellitus with diabetic chronic kidney disease: Secondary | ICD-10-CM | POA: Insufficient documentation

## 2017-02-06 DIAGNOSIS — G4733 Obstructive sleep apnea (adult) (pediatric): Secondary | ICD-10-CM | POA: Diagnosis not present

## 2017-02-06 DIAGNOSIS — R001 Bradycardia, unspecified: Secondary | ICD-10-CM | POA: Insufficient documentation

## 2017-02-06 DIAGNOSIS — Z720 Tobacco use: Secondary | ICD-10-CM | POA: Diagnosis not present

## 2017-02-06 DIAGNOSIS — Z794 Long term (current) use of insulin: Secondary | ICD-10-CM | POA: Insufficient documentation

## 2017-02-06 DIAGNOSIS — Z87891 Personal history of nicotine dependence: Secondary | ICD-10-CM | POA: Insufficient documentation

## 2017-02-06 DIAGNOSIS — I5042 Chronic combined systolic (congestive) and diastolic (congestive) heart failure: Secondary | ICD-10-CM | POA: Diagnosis present

## 2017-02-06 DIAGNOSIS — I426 Alcoholic cardiomyopathy: Secondary | ICD-10-CM | POA: Diagnosis not present

## 2017-02-06 DIAGNOSIS — I480 Paroxysmal atrial fibrillation: Secondary | ICD-10-CM | POA: Diagnosis not present

## 2017-02-06 DIAGNOSIS — Z8249 Family history of ischemic heart disease and other diseases of the circulatory system: Secondary | ICD-10-CM | POA: Insufficient documentation

## 2017-02-06 DIAGNOSIS — Z79899 Other long term (current) drug therapy: Secondary | ICD-10-CM | POA: Insufficient documentation

## 2017-02-06 DIAGNOSIS — Z9981 Dependence on supplemental oxygen: Secondary | ICD-10-CM | POA: Insufficient documentation

## 2017-02-06 DIAGNOSIS — J449 Chronic obstructive pulmonary disease, unspecified: Secondary | ICD-10-CM | POA: Diagnosis not present

## 2017-02-06 DIAGNOSIS — I2510 Atherosclerotic heart disease of native coronary artery without angina pectoris: Secondary | ICD-10-CM | POA: Diagnosis not present

## 2017-02-06 DIAGNOSIS — E039 Hypothyroidism, unspecified: Secondary | ICD-10-CM | POA: Diagnosis not present

## 2017-02-06 DIAGNOSIS — E1151 Type 2 diabetes mellitus with diabetic peripheral angiopathy without gangrene: Secondary | ICD-10-CM | POA: Diagnosis not present

## 2017-02-06 DIAGNOSIS — K7460 Unspecified cirrhosis of liver: Secondary | ICD-10-CM | POA: Diagnosis not present

## 2017-02-06 NOTE — Patient Instructions (Signed)
Your physician recommends that you schedule a follow-up appointment in: 4 months with Dr Aundra Dubin  Do the following things EVERYDAY: 1) Weigh yourself in the morning before breakfast. Write it down and keep it in a log. 2) Take your medicines as prescribed 3) Eat low salt foods-Limit salt (sodium) to 2000 mg per day.  4) Stay as active as you can everyday 5) Limit all fluids for the day to less than 2 liters

## 2017-02-06 NOTE — Progress Notes (Signed)
Paramedicine Encounter   Patient ID: [name] , female,   DOB: 1961/03/06,55 y.o.,  MRN: [id number]   Met patient in clinic today with provider.  Time spent with patient  Her weights at home for the wknd was around 266 and her CBG was 254 this morning. c/o severe leg pain/burning to her legs. No changes were made today. Continue to encourage her to increase her activity level. [facility] have [facility] the [facility] recently. Her legs are still wrapped and she is continuing to be followed there. Will be seen back in 38mhs by clinic. Our goal will be getting pt back comfortable with her meds so she can do her own pill box.    KMarylouise Stacks EMT-Paramedic  02/06/2017   ACTION: Home visit completed Next visit planned for thursday for med rec/pill box filling

## 2017-02-06 NOTE — Progress Notes (Signed)
Patient ID: [name], female   DOB: [birth year], 56 y.o.   MRN: [id number]    Advanced Heart Failure Clinic Note   PCP: Dr Roxy Manns HF Cardiology: Dr. Aundra Dubin PV: Dr. Gwenlyn Found [facility] [address].   56 yo with history of PAD, carotid stenosis s/p left CEA, relatively mild CAD, chronic systolic CHF, paroxysmal atrial fibrillation and prior substance abuse presents for CHF clinic followup.  She was admitted in 1/17 with acute hypoxemic respiratory failure in the setting of atrial fibrillation/RVR and volume overload.  She was initially intubated.  She converted back to NSR with amiodarone gtt.  She was treated with IV Lasix, steroids, bronchodilators.  She developed AKI and losartan was stopped.    She was admitted in 3/17 with symptomatic bradycardia.  She was supposed to stop diltiazem after this admission but continued to take it.  She presented later in 3/17, again with symptomatic bradycardia (junctional bradycardia), hypotension, and AKI.  Diltiazem and bisoprolol were stopped.  HR recovered and she has had no problems since.  ACEI was stopped with AKI.   Today she returns for HF follow up. Overall feeling ok. Complaining of R and LLE pain. Able to go to [employer] but can only do a few things due to leg pain.  Denies SOB/PND/Orthopnea. Limited by leg pain. No bleeding problems. She is followed at the [facility] in [facility]. Smoking a little bit every day. Weight at home trending up  267-270 pounds. Tries to follow low salt diet. Drinks < 2 liters per day. Lives with sister.   Echo 11/17/16  LVEF 40-45%, Grade 2 DD, Moderate LAE, RV normal  Labs (1/17): K 5.2, creatinine 1.4, AST 43, ALT normal Labs (2/17): K 4.2, creatinine 1.3, BNP 607, TSH normal Labs (3/17): K 4.2, creatinine 1.45, AST/ALT normal, HCT 28.7 Labs (4/17): K 4.4, creatinine 1.61 Labs (08/31/2016): K 4.5 Creatinine 1.43  Labs (11/17): K 4.2, creatinine 1.4  PMH: 1. Carotid stenosis: Known occluded right  carotid.  Left CEA in 4/16.  2. CAD: LHC in 12/15 with 80% stenosis in small OM1, nonobstructive disease in other territories.  3. Chronic systolic CHF: Nonischemic cardiomyopathy (?due to ETOH or prior drug abuse).  - Echo (1/17) with EF 45%, mild LV hypertrophy, moderate diastolic dysfunction, inferolateral severe hypokinesis, mildly decreased RV systolic function.  -ECHo 11/2016: Ef 4-45% Grade IIDD 4. Atrial fibrillation: Paroxysmal.   5. Type II diabetes 6. CKD: Stage III. 7. COPD: Quit smoking 1/17. On home oxygen.  8. 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  No longer drinks.  9. Hypothyroidism 10. PAD: Atherectomy SFA in 2014 (Dr Gwenlyn Found).  Peripheral arterial dopplers (2/17) with focal 75-99% proximal right SFA stenosis, occluded mid-distal right SFA, chronic occlusion of all runoff arteries on right. 11. Anemia 12. Prior cocaine abuse.  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   SH: Lives with sister.  Prior cocaine abuse.  Prior ETOH abuse.  Quit smoking in 1/17.  FH: CAD  ROS: All systems reviewed and negative except as per HPI.   Current Outpatient Prescriptions  Medication Sig Dispense Refill  . albuterol (PROVENTIL HFA;VENTOLIN HFA) 108 (90 Base) MCG/ACT inhaler Inhale 2 puffs into the lungs every 6 (six) hours as needed for wheezing or shortness of breath. 1 each 3  . albuterol (PROVENTIL) (2.5 MG/3ML) 0.083% nebulizer solution Take 3 mLs (2.5 mg total) by nebulization every 6 (six) hours as needed. For wheezing. 75 mL 2  . amiodarone (  PACERONE) 100 MG tablet TAKE 1 TABLET BY MOUTH ONCE DAILY 30 tablet 10  . amLODipine (NORVASC) 10 MG tablet Take 10 mg by mouth daily.    [name] atorvastatin (LIPITOR) 40 MG tablet Take 1 tablet (40 mg total) by mouth every evening. 21 tablet 0  . bisoprolol (ZEBETA) 5 MG tablet TAKE 1/2 TABLET BY MOUTH EVERY DAY 15 tablet 2  . Blood Glucose Monitoring Suppl (ACCU-CHEK AVIVA PLUS) W/DEVICE KIT 1 Device by Does not apply  route 4 (four) times daily -  before meals and at bedtime. 1 kit 0  . cholecalciferol (VITAMIN D) 1000 units tablet Take 1,000 Units by mouth daily.    . cyanocobalamin 1000 MCG tablet Take 1 tablet (1,000 mcg total) by mouth daily. 30 tablet 10  . folic acid (FOLVITE) 1 MG tablet TAKE 1 TABLET BY MOUTH EVERY DAY 90 tablet 1  . gabapentin (NEURONTIN) 600 MG tablet Take 300 mg by mouth at bedtime.     . insulin aspart (NOVOLOG FLEXPEN) 100 UNIT/ML FlexPen Inject 24-44 Units into the skin 3 (three) times daily with meals.     . Insulin Glargine (LANTUS SOLOSTAR) 100 UNIT/ML Solostar Pen Inject 80 Units into the skin daily at 10 pm.     . Insulin Pen Needle (ULTICARE MICRO PEN NEEDLES) 32G X 4 MM MISC 1 Syringe by Does not apply route 4 (four) times daily - after meals and at bedtime. 1 each 11  . Lancets (ACCU-CHEK SOFT TOUCH) lancets Use as instructed 100 each 12  . levothyroxine (SYNTHROID, LEVOTHROID) 50 MCG tablet Take 1 tablet (50 mcg total) by mouth daily. 30 tablet 2  . losartan (COZAAR) 25 MG tablet Take 1 tablet (25 mg total) by mouth daily. 21 tablet 0  . metolazone (ZAROXOLYN) 2.5 MG tablet TAKE 1 TABLET BY MOUTH ONCE A WEEK ON THURSDAY. MAY TAKE AN ADDITIONAL TABLET AS DIRECTED BY HF CLINIC. 5 tablet 4  . nitroGLYCERIN (NITROSTAT) 0.4 MG SL tablet Place 1 tablet (0.4 mg total) under the tongue every 5 (five) minutes as needed for chest pain. 25 tablet 3  . potassium chloride SA (K-DUR,KLOR-CON) 20 MEQ tablet Take 1 tablet (20 mEq total) by mouth daily. Take an additional 20 meq (1 tab) on Thursday with metolazone 35 tablet 6  . spironolactone (ALDACTONE) 25 MG tablet Take 1 tablet (25 mg total) by mouth daily. 90 tablet 3  . SYMBICORT 160-4.5 MCG/ACT inhaler INHALE 2 PUFFS INTO THE LUNGS TWICE DAILY 1 Inhaler 11  . torsemide (DEMADEX) 20 MG tablet Take 2 tablets (40 mg total) by mouth 2 (two) times daily. 120 tablet 3  . warfarin (COUMADIN) 5 MG tablet TAKE 1 TABLET TO 1 AND 1/2 TABLETS BY  MOUTH EVERY DAY AS DIRECTED BY COUMADIN CLINIC 45 tablet 2   No current facility-administered medications for this encounter.    Vitals:   02/06/17 1207  BP: 102/68  Pulse: 84  SpO2: 99%  Weight: 265 lb 6.4 oz (120.4 kg)     Wt Readings from Last 3 Encounters:  02/06/17 265 lb 6.4 oz (120.4 kg)  02/03/17 273 lb 6.4 oz (124 kg)  02/02/17 270 lb (122.5 kg)     General: Obese, NAD. Arrived in wheel chair HEENT: Normal Neck: Thick, JVP 7-78o thyromegaly or nodule noted.  No carotid bruit.  Lungs: CTAB.  CV: Nondisplaced PMI.  RRR S1/S2, no S3/S4, no murmur.  Abdomen: Obese, soft, NT, ND, no HSM. No bruits or masses. +BS  Skin: Intact without  lesions or rashes.  Neurologic: Alert and oriented x 3.   Psych: Normal affect. Extremities: No clubbing or cyanosis. R and LLE compression wraps.   Assessment/Plan: 1. Chronic combined diastolic/systolic CHF:  Has been presumed nonischemic given LHC in 12/15 showing only 80% stenosis in small OM1.  ECHO 11/2016 EF 40-45% Grade IIDD.  NYHA II.  Volume status stable. Continue torsemide 40 mg twice a day + metiolazone every Thursday with extra 20 of potassium. 40 mg BID for now.  - Continue bisoprolol 2.5 mg daily.  - Continue losartan 25 mg daily.     - Continue spironolactone 25 mg daily.    2. PAF: -Continue amio 100 mg daily.   -LFTs and TSH stable 11/11/16. PCP following hypothyroidism (on Levoxyl).   Needs regular eye exams. - On coumadin. No bleeding problems.  3. PAD: Bilateral calf claudication seems limiting.  No rest pain or pedal ulcers.  Peripheral arterial dopplers show severe disease on right.  Not candidate for cilostazol with CHF. - No good intervention per Dr. Gwenlyn Found.    - No change to current plan.  4. COPD:  - On home 02.  -Discussed smoking cessation.  5. H/o cirrhosis: Likely from ETOH, no longer drinks.  - No change to current plan.  6. OSA.  Needs to start CPAP. I have asked her to follow up with Dr Radford Pax. 7. CKD  stage III Repeat BMET next visit.  8. HTN:   - Stable.   9. Bradycardia: Junctional rhythm when hospitalized in 3/17.   10. Chronic leg wound:  - Followed at [facility].  11. Tobacco Abuse- Discussed smoking cessation.   Follow up in 2 months  Continue Paramedicine.    Darrick Grinder, NP-C  02/06/2017

## 2017-02-09 ENCOUNTER — Other Ambulatory Visit (HOSPITAL_COMMUNITY): Payer: Self-pay

## 2017-02-09 NOTE — Progress Notes (Signed)
Paramedicine Encounter    Patient ID: [name], female    DOB: 1961/09/13, 56 y.o.   MRN: [id number]   Patient Care Team: Sandi Mariscal, MD as PCP - General (Internal Medicine) [name], MD as Consulting Physician (Cardiology) [name], MD as Consulting Physician (Pulmonary Disease)  Patient Active Problem List   Diagnosis Date Noted  . CKD (chronic kidney disease), stage III 11/24/2016  . Suspected sleep apnea 11/24/2016  . Severe obesity (BMI >= 40) ([place]) 02/24/2016  . COPD GOLD 0  02/24/2016  . Chronic systolic CHF (congestive heart failure) ([place]) 02/12/2016  . Encounter for therapeutic drug monitoring 02/10/2016  . Symptomatic bradycardia 01/12/2016  . Essential hypertension 12/22/2015  . Wheeze   . 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 Tobacco abuse 10/23/2015  . Coronary artery disease involving native coronary artery of native heart   . DOE (dyspnea on exertion) 04/29/2015  . PAF (paroxysmal atrial fibrillation) ([place]) 01/16/2015  . Carotid arterial disease ([place]) 01/16/2015  . Claudication ([place]) 01/15/2015  . Demand ischemia ([place]) 10/29/2014  . Insomnia 02/03/2014  . S/P peripheral artery angioplasty - [place]bo[place] atherectomy; R SFA 09/11/2013    Class: Acute  . Leg pain, bilateral 08/19/2013  . Hypothyroidism 07/31/2013  . Cellulitis 06/13/2013  . History of cocaine abuse 06/13/2013  . Long term current use of anticoagulant therapy 05/20/2013  . Alcohol abuse   . Narcotic abuse   . Marijuana abuse   . Alcoholic cirrhosis ([place])   . DM (diabetes mellitus), type 2 with peripheral vascular complications ([name])     Current Outpatient Prescriptions:  .  albuterol (PROVENTIL HFA;VENTOLIN HFA) 108 (90 Base) MCG/ACT inhaler, Inhale 2 puffs into the lungs every 6 (six) hours as needed for wheezing or shortness of breath., Disp: 1 each, Rfl: 3 .  albuterol (PROVENTIL) (2.5 MG/3ML) 0.083% nebulizer solution, Take 3 mLs (2.5 mg total) by nebulization every 6 (six)  hours as needed. For wheezing., Disp: 75 mL, Rfl: 2 .  amiodarone (PACERONE) 100 MG tablet, TAKE 1 TABLET BY MOUTH ONCE DAILY, Disp: 30 tablet, Rfl: 10 .  amLODipine (NORVASC) 10 MG tablet, Take 10 mg by mouth daily., Disp: , Rfl:  .  atorvastatin (LIPITOR) 40 MG tablet, Take 1 tablet (40 mg total) by mouth every evening., Disp: 21 tablet, Rfl: 0 .  bisoprolol (ZEBETA) 5 MG tablet, TAKE 1/2 TABLET BY MOUTH EVERY DAY, Disp: 15 tablet, Rfl: 2 .  Blood Glucose Monitoring Suppl (ACCU-CHEK AVIVA PLUS) W/DEVICE KIT, 1 Device by Does not apply route 4 (four) times daily -  before meals and at bedtime., Disp: 1 kit, Rfl: 0 .  cholecalciferol (VITAMIN D) 1000 units tablet, Take 1,000 Units by mouth daily., Disp: , Rfl:  .  cyanocobalamin 1000 MCG tablet, Take 1 tablet (1,000 mcg total) by mouth daily., Disp: 30 tablet, Rfl: 10 .  folic acid (FOLVITE) 1 MG tablet, TAKE 1 TABLET BY MOUTH EVERY DAY, Disp: 90 tablet, Rfl: 1 .  gabapentin (NEURONTIN) 600 MG tablet, Take 300 mg by mouth at bedtime. , Disp: , Rfl:  .  insulin aspart (NOVOLOG FLEXPEN) 100 UNIT/ML FlexPen, Inject 24-44 Units into the skin 3 (three) times daily with meals. , Disp: , Rfl:  .  Insulin Glargine (LANTUS SOLOSTAR) 100 UNIT/ML Solostar Pen, Inject 80 Units into the skin daily at 10 pm. , Disp: , Rfl:  .  Insulin Pen Needle (ULTICARE MICRO PEN NEEDLES) 32G X 4 MM MISC, 1 Syringe by Does not  apply route 4 (four) times daily - after meals and at bedtime., Disp: 1 each, Rfl: 11 .  Lancets (ACCU-CHEK SOFT TOUCH) lancets, Use as instructed, Disp: 100 each, Rfl: 12 .  levothyroxine (SYNTHROID, LEVOTHROID) 50 MCG tablet, Take 1 tablet (50 mcg total) by mouth daily., Disp: 30 tablet, Rfl: 2 .  losartan (COZAAR) 25 MG tablet, Take 1 tablet (25 mg total) by mouth daily., Disp: 21 tablet, Rfl: 0 .  metolazone (ZAROXOLYN) 2.5 MG tablet, TAKE 1 TABLET BY MOUTH ONCE A WEEK ON THURSDAY. MAY TAKE AN ADDITIONAL TABLET AS DIRECTED BY HF CLINIC., Disp: 5  tablet, Rfl: 4 .  potassium chloride SA (K-DUR,KLOR-CON) 20 MEQ tablet, Take 1 tablet (20 mEq total) by mouth daily. Take an additional 20 meq (1 tab) on Thursday with metolazone, Disp: 35 tablet, Rfl: 6 .  spironolactone (ALDACTONE) 25 MG tablet, Take 1 tablet (25 mg total) by mouth daily., Disp: 90 tablet, Rfl: 3 .  SYMBICORT 160-4.5 MCG/ACT inhaler, INHALE 2 PUFFS INTO THE LUNGS TWICE DAILY, Disp: 1 Inhaler, Rfl: 11 .  torsemide (DEMADEX) 20 MG tablet, Take 2 tablets (40 mg total) by mouth 2 (two) times daily., Disp: 120 tablet, Rfl: 3 .  warfarin (COUMADIN) 5 MG tablet, TAKE 1 TABLET TO 1 AND 1/2 TABLETS BY MOUTH EVERY DAY AS DIRECTED BY COUMADIN CLINIC, Disp: 45 tablet, Rfl: 2 .  nitroGLYCERIN (NITROSTAT) 0.4 MG SL tablet, Place 1 tablet (0.4 mg total) under the tongue every 5 (five) minutes as needed for chest pain. (Patient not taking: Reported on 02/09/2017), Disp: 25 tablet, Rfl: 3 Allergies  Allergen Reactions  . Gabapentin Nausea And Vomiting    POSSIBLE SHAKING? TAKING MED CURRENTLY     Social History   Social History  . Marital status: Single    Spouse name: N/A  . Number of children: N/A  . Years of education: N/A   Occupational History  . disabled    Social History Main Topics  . Smoking status: Former Smoker    Packs/day: 0.00    Years: 40.00    Types: E-cigarettes    Quit date: 11/03/2015  . Smokeless tobacco: Former Systems developer  . Alcohol use No  . Drug use: No     Comment: 04/29/2015 "last drug use was ~ 09/08/2013"  . Sexual activity: No   Other Topics Concern  . Not on file   Social History Narrative   Lives in [place], in [place] [place] with sister.  They are looking to move but don't have a place to go yet.      Physical Exam  Constitutional: She is oriented to person, place, and time.  Pulmonary/Chest: Effort normal and breath sounds normal. No respiratory distress. She has no wheezes. She has no rales.  Abdominal: Soft.  Musculoskeletal: Normal range of motion.   Neurological: She is oriented to person, place, and time.  Skin: Skin is warm and dry.  Psychiatric: She has a normal mood and affect.        SAFE - 12/27/16 1100      Situation   Admitting diagnosis chf   Heart failure history Exisiting   Comorbidities DM;HTN;Poor Wound Healing;Sleep Apnea;CKD/renal insufficiency;Anemia;Atrial fibillation;Hx DVT/PE;Hx MI/CAD   Readmitted within 30 days No     Assessment   Lives alone No   Primary support person sister-billie   Mode of transportation personal car;family/friends   Other services involved None   Home equipement Scale;Wheelchair     [employer]   Weighs self daily Yes  Scale provided No  pt has own    Records on weight chart Yes     Resources   Has "Living better w/heart failure" book Yes   Has HF Zone tool Yes   Able to identify yellow zone signs/when to call MD Yes   Records zone daily No     Medications   Uses a pill box Yes   Who stocks the pill box paramedicine   Pill box checked this visit Yes   Pill box refilled this visit Yes   Difficulty obtaining medications No   Mail order medications Yes   New medications at home today No   Next scheduled delivery of medications 12/29/16   Missed one or more doses of medications per week No     Nutrition   Patient receives meals on wheels No   Patient follows low sodium diet No   Has foods at home that meet the current recommended diet Yes   Patient follows low sugar/card diet No   Nutritional concerns/issues y     Activity Level   ADL's/Mobility Independent   How many feet can patient ambulate 73f   Typical activity level poor mobility   Barriers reading/pain to legs   Actvity tolerance: NYHA Class 3     Urine   Difficulty urinating No   Changes in urine None     Time spent with patient   Time spent with patient  60 Minutes        Future Appointments Date Time Provider [place] 02/16/2017 2:50 PM [facility] MM 3 [facility]  02/17/2017  12:30 PM [facility]   BP 132/80   Pulse 82   Resp 16   Wt 263 lb (119.3 kg)   LMP 11/27/2012   SpO2 98%   BMI 45.14 kg/m  Weight yesterday-263.6 Last visit weight-265 CBG PTA-HI  CBG by EMS-HI   Pt reports that she started prednisone yesterday for her legs, this morning her CBG was HI, she took her insulin, ate and hasnt rechecked it yet-pt states she feels good, her legs feel better, she was having abd cramping the other day and along with her feet and hands. meds verified and pill box refilled. She missed mon noon time meds which was her torsemide.  She states she took 44U of novolog this morning, still reading HI and she is going to take an additional 44U now. She is going to get xray of her toe today and is going back to wound clinic as they prescribed the prednisone and antibiotic, advised her to tell them about her HI CBG readings.   ACTION: Home visit completed Next visit planned for next week   KMarylouise Stacks EMT-Paramedic 02/09/17

## 2017-02-10 NOTE — Progress Notes (Signed)
[facility] PREP Weekly Session   Patient Details  Name: [name] MRN: [id number] Date of Birth: [birth year] Age: 56 y.o. PCP: Sandi Mariscal, MD  Vitals:   02/10/17 1355  Weight: 265 lb 6.4 oz (120.4 kg)        [name] Weekly seesion - 02/10/17 1300      Weekly Session   Topic Discussed Expectations and non-scale victories   Minutes exercised this week 180 minutes  90cardio/90strength   Classes attended to date [place] 02/10/2017, 1:56 PM

## 2017-02-14 ENCOUNTER — Telehealth: Payer: Self-pay | Admitting: *Deleted

## 2017-02-14 NOTE — Telephone Encounter (Signed)
Called [name] for her [facility] registry 2 year follow-up. She has not had any additional interventions on her right leg. I will follow-up again in 1 year.

## 2017-02-15 NOTE — Progress Notes (Signed)
[facility] PREP Weekly Session   Patient Details  Name: [name] MRN: [id number] Date of Birth: 01-12-1961 Age: 56 y.o. PCP: Sandi Mariscal, MD  Vitals:   02/15/17 1320  Weight: 263 lb 6.4 oz (119.5 kg)        [name] Weekly seesion - 02/15/17 1300      Weekly Session   Topic Discussed Other  portion control   Minutes exercised this week 32 minutes  2cardio/30strength   Classes attended to date [place] 02/15/2017, 1:21 PM

## 2017-02-16 ENCOUNTER — Other Ambulatory Visit (HOSPITAL_COMMUNITY): Payer: Self-pay

## 2017-02-16 ENCOUNTER — Ambulatory Visit
Admission: RE | Admit: 2017-02-16 | Discharge: 2017-02-16 | Disposition: A | Discharge status: Home / Self Care | Payer: Medicare Other | Source: Ambulatory Visit | Attending: Internal Medicine | Admitting: Internal Medicine

## 2017-02-16 DIAGNOSIS — Z1231 Encounter for screening mammogram for malignant neoplasm of breast: Secondary | ICD-10-CM

## 2017-02-16 NOTE — Progress Notes (Signed)
Paramedicine Encounter    Patient ID: [name], female    DOB: 1961/09/13, 56 y.o.   MRN: [id number]   Patient Care Team: Sandi Mariscal, MD as PCP - General (Internal Medicine) [name], MD as Consulting Physician (Cardiology) [name], MD as Consulting Physician (Pulmonary Disease)  Patient Active Problem List   Diagnosis Date Noted  . CKD (chronic kidney disease), stage III 11/24/2016  . Suspected sleep apnea 11/24/2016  . Severe obesity (BMI >= 40) ([place]) 02/24/2016  . COPD GOLD 0  02/24/2016  . Chronic systolic CHF (congestive heart failure) ([place]) 02/12/2016  . Encounter for therapeutic drug monitoring 02/10/2016  . Symptomatic bradycardia 01/12/2016  . Essential hypertension 12/22/2015  . Wheeze   . 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 Tobacco abuse 10/23/2015  . Coronary artery disease involving native coronary artery of native heart   . DOE (dyspnea on exertion) 04/29/2015  . PAF (paroxysmal atrial fibrillation) ([place]) 01/16/2015  . Carotid arterial disease ([place]) 01/16/2015  . Claudication ([place]) 01/15/2015  . Demand ischemia ([place]) 10/29/2014  . Insomnia 02/03/2014  . S/P peripheral artery angioplasty - TurboHawk atherectomy; R SFA 09/11/2013    Class: Acute  . Leg pain, bilateral 08/19/2013  . Hypothyroidism 07/31/2013  . Cellulitis 06/13/2013  . History of cocaine abuse 06/13/2013  . Long term current use of anticoagulant therapy 05/20/2013  . Alcohol abuse   . Narcotic abuse   . Marijuana abuse   . Alcoholic cirrhosis ([place])   . DM (diabetes mellitus), type 2 with peripheral vascular complications ([name])     Current Outpatient Prescriptions:  .  albuterol (PROVENTIL HFA;VENTOLIN HFA) 108 (90 Base) MCG/ACT inhaler, Inhale 2 puffs into the lungs every 6 (six) hours as needed for wheezing or shortness of breath., Disp: 1 each, Rfl: 3 .  albuterol (PROVENTIL) (2.5 MG/3ML) 0.083% nebulizer solution, Take 3 mLs (2.5 mg total) by nebulization every 6 (six)  hours as needed. For wheezing., Disp: 75 mL, Rfl: 2 .  amiodarone (PACERONE) 100 MG tablet, TAKE 1 TABLET BY MOUTH ONCE DAILY, Disp: 30 tablet, Rfl: 10 .  amLODipine (NORVASC) 10 MG tablet, Take 10 mg by mouth daily., Disp: , Rfl:  .  atorvastatin (LIPITOR) 40 MG tablet, Take 1 tablet (40 mg total) by mouth every evening., Disp: 21 tablet, Rfl: 0 .  bisoprolol (ZEBETA) 5 MG tablet, TAKE 1/2 TABLET BY MOUTH EVERY DAY, Disp: 15 tablet, Rfl: 2 .  Blood Glucose Monitoring Suppl (ACCU-CHEK AVIVA PLUS) W/DEVICE KIT, 1 Device by Does not apply route 4 (four) times daily -  before meals and at bedtime., Disp: 1 kit, Rfl: 0 .  cholecalciferol (VITAMIN D) 1000 units tablet, Take 1,000 Units by mouth daily., Disp: , Rfl:  .  cyanocobalamin 1000 MCG tablet, Take 1 tablet (1,000 mcg total) by mouth daily., Disp: 30 tablet, Rfl: 10 .  folic acid (FOLVITE) 1 MG tablet, TAKE 1 TABLET BY MOUTH EVERY DAY, Disp: 90 tablet, Rfl: 1 .  gabapentin (NEURONTIN) 600 MG tablet, Take 300 mg by mouth at bedtime. , Disp: , Rfl:  .  insulin aspart (NOVOLOG FLEXPEN) 100 UNIT/ML FlexPen, Inject 24-44 Units into the skin 3 (three) times daily with meals. , Disp: , Rfl:  .  Insulin Glargine (LANTUS SOLOSTAR) 100 UNIT/ML Solostar Pen, Inject 80 Units into the skin daily at 10 pm. , Disp: , Rfl:  .  Insulin Pen Needle (ULTICARE MICRO PEN NEEDLES) 32G X 4 MM MISC, 1 Syringe by Does not  apply route 4 (four) times daily - after meals and at bedtime., Disp: 1 each, Rfl: 11 .  Lancets (ACCU-CHEK SOFT TOUCH) lancets, Use as instructed, Disp: 100 each, Rfl: 12 .  levothyroxine (SYNTHROID, LEVOTHROID) 50 MCG tablet, Take 1 tablet (50 mcg total) by mouth daily., Disp: 30 tablet, Rfl: 2 .  losartan (COZAAR) 25 MG tablet, Take 1 tablet (25 mg total) by mouth daily., Disp: 21 tablet, Rfl: 0 .  metolazone (ZAROXOLYN) 2.5 MG tablet, TAKE 1 TABLET BY MOUTH ONCE A WEEK ON THURSDAY. MAY TAKE AN ADDITIONAL TABLET AS DIRECTED BY HF CLINIC., Disp: 5  tablet, Rfl: 4 .  potassium chloride SA (K-DUR,KLOR-CON) 20 MEQ tablet, Take 1 tablet (20 mEq total) by mouth daily. Take an additional 20 meq (1 tab) on Thursday with metolazone, Disp: 35 tablet, Rfl: 6 .  spironolactone (ALDACTONE) 25 MG tablet, Take 1 tablet (25 mg total) by mouth daily., Disp: 90 tablet, Rfl: 3 .  SYMBICORT 160-4.5 MCG/ACT inhaler, INHALE 2 PUFFS INTO THE LUNGS TWICE DAILY, Disp: 1 Inhaler, Rfl: 11 .  torsemide (DEMADEX) 20 MG tablet, Take 2 tablets (40 mg total) by mouth 2 (two) times daily., Disp: 120 tablet, Rfl: 3 .  warfarin (COUMADIN) 5 MG tablet, TAKE 1 TABLET TO 1 AND 1/2 TABLETS BY MOUTH EVERY DAY AS DIRECTED BY COUMADIN CLINIC, Disp: 45 tablet, Rfl: 2 .  nitroGLYCERIN (NITROSTAT) 0.4 MG SL tablet, Place 1 tablet (0.4 mg total) under the tongue every 5 (five) minutes as needed for chest pain. (Patient not taking: Reported on 02/09/2017), Disp: 25 tablet, Rfl: 3 Allergies  Allergen Reactions  . Gabapentin Nausea And Vomiting    POSSIBLE SHAKING? TAKING MED CURRENTLY     Social History   Social History  . Marital status: Single    Spouse name: N/A  . Number of children: N/A  . Years of education: N/A   Occupational History  . disabled    Social History Main Topics  . Smoking status: Former Smoker    Packs/day: 0.00    Years: 40.00    Types: E-cigarettes    Quit date: 11/03/2015  . Smokeless tobacco: Former Neurosurgeon  . Alcohol use No  . Drug use: No     Comment: 04/29/2015 "last drug use was ~ 09/08/2013"  . Sexual activity: No   Other Topics Concern  . Not on file   Social History Narrative   Lives in [place], in motel with sister.  They are looking to move but don't have a place to go yet.      Physical Exam      SAFE - 12/27/16 1100      Situation   Admitting diagnosis chf   Heart failure history Exisiting   Comorbidities DM;HTN;Poor Wound Healing;Sleep Apnea;CKD/renal insufficiency;Anemia;Atrial fibillation;Hx DVT/PE;Hx MI/CAD   Readmitted  within 30 days No     Assessment   Lives alone No   Primary support person sister-billie   Mode of transportation personal car;family/friends   Other services involved None   Home equipement Scale;Wheelchair     [employer]   Weighs self daily Yes   Scale provided No  pt has own    Records on weight chart Yes     Resources   Has "Living better w/heart failure" book Yes   Has HF Zone tool Yes   Able to identify yellow zone signs/when to call MD Yes   Records zone daily No     Medications   Uses a pill box Yes  Who stocks the pill box paramedicine   Pill box checked this visit Yes   Pill box refilled this visit Yes   Difficulty obtaining medications No   Mail order medications Yes   New medications at home today No   Next scheduled delivery of medications 12/29/16   Missed one or more doses of medications per week No     Nutrition   Patient receives meals on wheels No   Patient follows low sodium diet No   Has foods at home that meet the current recommended diet Yes   Patient follows low sugar/card diet No   Nutritional concerns/issues y     Activity Level   ADL's/Mobility Independent   How many feet can patient ambulate 27f   Typical activity level poor mobility   Barriers reading/pain to legs   Actvity tolerance: NYHA Class 3     Urine   Difficulty urinating No   Changes in urine None     Time spent with patient   Time spent with patient  60 Minutes        Future Appointments Date Time Provider [place] 02/17/2017 12:30 PM [facility]   BP (!) 150/78   Pulse 80   Resp 16   Wt 262 lb (118.8 kg)   LMP 11/27/2012   SpO2 98%   BMI 44.97 kg/m  Weight yesterday-261 Last visit weight-263 [facility] PTA @ 11-136 [facility] EMS-431  Pt reports that she is feeling ok, her legs are feet are still very painful with shocks of pain shooting through her feet and causes her difficulty in ambulating. She now has a home health  nurse coming out twice a week for dressing changes. Pt denies any dizziness, no sob, legs are wrapped, no h/a. She states she is fixing to take her insulin with a snack as she didn't take it at lunch time since it was 136. Advised her again to get in touch with her PCP to manage her diabetes.   ACTION: Home visit completed Next visit planned for next week   KMarylouise Stacks EMT-Paramedic 02/16/17

## 2017-02-17 ENCOUNTER — Ambulatory Visit (INDEPENDENT_AMBULATORY_CARE_PROVIDER_SITE_OTHER): Payer: Medicare Other | Admitting: *Deleted

## 2017-02-17 DIAGNOSIS — I4891 Unspecified atrial fibrillation: Secondary | ICD-10-CM | POA: Diagnosis not present

## 2017-02-17 DIAGNOSIS — Z5181 Encounter for therapeutic drug level monitoring: Secondary | ICD-10-CM | POA: Diagnosis not present

## 2017-02-17 DIAGNOSIS — I480 Paroxysmal atrial fibrillation: Secondary | ICD-10-CM

## 2017-02-17 LAB — POCT INR: INR: 3.5

## 2017-02-22 NOTE — Progress Notes (Signed)
[facility] PREP Weekly Session   Patient Details  Name: [name] MRN: [id number] Date of Birth: Feb 02, 1961 Age: 56 y.o. PCP: Sandi Mariscal, MD  Vitals:   02/22/17 0029  Weight: 266 lb 6.4 oz (120.8 kg)        [name] Weekly seesion - 02/22/17 0900      Weekly Session   Topic Discussed Finding support   Minutes exercised this week 57 minutes  7cardio(?)/50strength   Classes attended to date [place] 02/22/2017, 9:28 AM

## 2017-02-23 ENCOUNTER — Other Ambulatory Visit (HOSPITAL_COMMUNITY): Payer: Self-pay | Admitting: Internal Medicine

## 2017-02-23 ENCOUNTER — Other Ambulatory Visit (HOSPITAL_COMMUNITY): Payer: Self-pay

## 2017-02-23 ENCOUNTER — Other Ambulatory Visit (HOSPITAL_COMMUNITY): Payer: Self-pay | Admitting: Student

## 2017-02-23 DIAGNOSIS — I5043 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) and diastolic (congestive) heart failure: Secondary | ICD-10-CM

## 2017-02-23 NOTE — Progress Notes (Signed)
Paramedicine Encounter    Patient ID: [name], female    DOB: [birth year], 56 y.o.   MRN: [id number]    Patient Care Team: Sandi Mariscal, MD as PCP - General (Internal Medicine) [name], MD as Consulting Physician (Cardiology) [name], MD as Consulting Physician (Pulmonary Disease)  Patient Active Problem List   Diagnosis Date Noted  . CKD (chronic kidney disease), stage III 11/24/2016  . Suspected sleep apnea 11/24/2016  . Severe obesity (BMI >= 40) ([place]) 02/24/2016  . COPD GOLD 0  02/24/2016  . Chronic systolic CHF (congestive heart failure) ([place]) 02/12/2016  . Encounter for therapeutic drug monitoring 02/10/2016  . Symptomatic bradycardia 01/12/2016  . Essential hypertension 12/22/2015  . Wheeze   . 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 Tobacco abuse 10/23/2015  . Coronary artery disease involving native coronary artery of native heart   . DOE (dyspnea on exertion) 04/29/2015  . PAF (paroxysmal atrial fibrillation) ([place]) 01/16/2015  . Carotid arterial disease ([place]) 01/16/2015  . Claudication ([place]) 01/15/2015  . Demand ischemia ([place]) 10/29/2014  . Insomnia 02/03/2014  . S/P peripheral artery angioplasty - TurboHawk atherectomy; R SFA 09/11/2013    Class: Acute  . Leg pain, bilateral 08/19/2013  . Hypothyroidism 07/31/2013  . Cellulitis 06/13/2013  . History of cocaine abuse 06/13/2013  . Long term current use of anticoagulant therapy 05/20/2013  . Alcohol abuse   . Narcotic abuse   . Marijuana abuse   . Alcoholic cirrhosis ([place])   . DM (diabetes mellitus), type 2 with peripheral vascular complications ([name])     Current Outpatient Prescriptions:  .  amiodarone (PACERONE) 100 MG tablet, TAKE 1 TABLET BY MOUTH ONCE DAILY, Disp: 30 tablet, Rfl: 10 .  amLODipine (NORVASC) 10 MG tablet, Take 10 mg by mouth daily., Disp: , Rfl:  .  atorvastatin (LIPITOR) 40 MG tablet, Take 1 tablet (40 mg total) by mouth every evening., Disp: 21 tablet, Rfl: 0 .  bisoprolol  (ZEBETA) 5 MG tablet, TAKE 1/2 TABLET BY MOUTH EVERY DAY, Disp: 15 tablet, Rfl: 2 .  Blood Glucose Monitoring Suppl (ACCU-CHEK AVIVA PLUS) W/DEVICE KIT, 1 Device by Does not apply route 4 (four) times daily -  before meals and at bedtime., Disp: 1 kit, Rfl: 0 .  cholecalciferol (VITAMIN D) 1000 units tablet, Take 1,000 Units by mouth daily., Disp: , Rfl:  .  folic acid (FOLVITE) 1 MG tablet, TAKE 1 TABLET BY MOUTH EVERY DAY, Disp: 90 tablet, Rfl: 1 .  gabapentin (NEURONTIN) 600 MG tablet, Take 300 mg by mouth at bedtime. , Disp: , Rfl:  .  insulin aspart (NOVOLOG FLEXPEN) 100 UNIT/ML FlexPen, Inject 24-44 Units into the skin 3 (three) times daily with meals. , Disp: , Rfl:  .  Insulin Glargine (LANTUS SOLOSTAR) 100 UNIT/ML Solostar Pen, Inject 80 Units into the skin daily at 10 pm. , Disp: , Rfl:  .  Insulin Pen Needle (ULTICARE MICRO PEN NEEDLES) 32G X 4 MM MISC, 1 Syringe by Does not apply route 4 (four) times daily - after meals and at bedtime., Disp: 1 each, Rfl: 11 .  Lancets (ACCU-CHEK SOFT TOUCH) lancets, Use as instructed, Disp: 100 each, Rfl: 12 .  levothyroxine (SYNTHROID, LEVOTHROID) 50 MCG tablet, Take 1 tablet (50 mcg total) by mouth daily., Disp: 30 tablet, Rfl: 2 .  losartan (COZAAR) 25 MG tablet, Take 1 tablet (25 mg total) by mouth daily., Disp: 21 tablet, Rfl: 0 .  potassium chloride SA (K-DUR,KLOR-CON) 20 MEQ  tablet, Take 1 tablet (20 mEq total) by mouth daily. Take an additional 20 meq (1 tab) on Thursday with metolazone, Disp: 35 tablet, Rfl: 6 .  spironolactone (ALDACTONE) 25 MG tablet, Take 1 tablet (25 mg total) by mouth daily., Disp: 90 tablet, Rfl: 3 .  warfarin (COUMADIN) 5 MG tablet, TAKE 1 TABLET TO 1 AND 1/2 TABLETS BY MOUTH EVERY DAY AS DIRECTED BY COUMADIN CLINIC, Disp: 45 tablet, Rfl: 2 .  albuterol (PROVENTIL HFA;VENTOLIN HFA) 108 (90 Base) MCG/ACT inhaler, Inhale 2 puffs into the lungs every 6 (six) hours as needed for wheezing or shortness of breath., Disp: 1 each,  Rfl: 3 .  albuterol (PROVENTIL) (2.5 MG/3ML) 0.083% nebulizer solution, Take 3 mLs (2.5 mg total) by nebulization every 6 (six) hours as needed. For wheezing., Disp: 75 mL, Rfl: 2 .  cyanocobalamin 1000 MCG tablet, Take 1 tablet (1,000 mcg total) by mouth daily., Disp: 30 tablet, Rfl: 10 .  metolazone (ZAROXOLYN) 2.5 MG tablet, TAKE 1 TABLET BY MOUTH ONCE A WEEK ON THURSDAY. MAY TAKE AN ADDITIONAL TABLET AS DIRECTED BY HF CLINIC., Disp: 5 tablet, Rfl: 4 .  nitroGLYCERIN (NITROSTAT) 0.4 MG SL tablet, Place 1 tablet (0.4 mg total) under the tongue every 5 (five) minutes as needed for chest pain. (Patient not taking: Reported on 02/09/2017), Disp: 25 tablet, Rfl: 3 .  SYMBICORT 160-4.5 MCG/ACT inhaler, INHALE 2 PUFFS INTO THE LUNGS TWICE DAILY, Disp: 1 Inhaler, Rfl: 11 .  torsemide (DEMADEX) 20 MG tablet, Take 2 tablets (40 mg total) by mouth 2 (two) times daily., Disp: 120 tablet, Rfl: 3 Allergies  Allergen Reactions  . Gabapentin Nausea And Vomiting    POSSIBLE SHAKING? TAKING MED CURRENTLY     Social History   Social History  . Marital status: Single    Spouse name: N/A  . Number of children: N/A  . Years of education: N/A   Occupational History  . disabled    Social History Main Topics  . Smoking status: Former Smoker    Packs/day: 0.00    Years: 40.00    Types: E-cigarettes    Quit date: 11/03/2015  . Smokeless tobacco: Former Systems developer  . Alcohol use No  . Drug use: No     Comment: 04/29/2015 "last drug use was ~ 09/08/2013"  . Sexual activity: No   Other Topics Concern  . Not on file   Social History Narrative   Lives in [place], in [place] with sister.  They are looking to move but don't have a place to go yet.      Physical Exam  Eyes: Pupils are equal, round, and reactive to light.  Pulmonary/Chest: No respiratory distress. She has wheezes. She has no rales.  Abdominal: She exhibits no distension. There is no tenderness. There is no guarding.  Musculoskeletal: She exhibits  edema.  Skin: Skin is warm and dry. She is not diaphoretic.        SAFE - 12/27/16 1100      Situation   Admitting diagnosis chf   Heart failure history Exisiting   Comorbidities DM;HTN;Poor Wound Healing;Sleep Apnea;CKD/renal insufficiency;Anemia;Atrial fibillation;Hx DVT/PE;Hx MI/CAD   Readmitted within 30 days No     Assessment   Lives alone No   Primary support person sister-billie   Mode of transportation personal car;family/friends   Other services involved None   Home equipement Scale;Wheelchair     Weight   Weighs self daily Yes   Scale provided No  pt has own    Records  on weight chart Yes     Resources   Has "Living better w/heart failure" book Yes   Has HF Zone tool Yes   Able to identify yellow zone signs/when to call MD Yes   Records zone daily No     Medications   Uses a pill box Yes   Who stocks the pill box paramedicine   Pill box checked this visit Yes   Pill box refilled this visit Yes   Difficulty obtaining medications No   Mail order medications Yes   New medications at home today No   Next scheduled delivery of medications 12/29/16   Missed one or more doses of medications per week No     Nutrition   Patient receives meals on wheels No   Patient follows low sodium diet No   Has foods at home that meet the current recommended diet Yes   Patient follows low sugar/card diet No   Nutritional concerns/issues y     Activity Level   ADL's/Mobility Independent   How many feet can patient ambulate 61f   Typical activity level poor mobility   Barriers reading/pain to legs   Actvity tolerance: NYHA Class 3     Urine   Difficulty urinating No   Changes in urine None     Time spent with patient   Time spent with patient  60 Minutes        Future Appointments Date Time Provider [place] 03/16/2017 1:30 PM [facility]  03/17/2017 1:00 PM [facility]    ATF pt CAO x4 c/o  leg pain caused by nerve pain.    currently out of gabapentin for about 2 weeks, metolazone is "coming by next week" pt stated that she only take it on thursdays. Pt is now filling her pill box independently. Pt had multiple pills in a couple of her slots which was corrected. Pt has an .5 pill extra coumadine on Monday and Friday.  Pt's vitals noted. Pt has a lot of leg pain, due to her legs being re-wrapped earlier.  Pt is taking tylenol but she says isn't working.  Pt denies sob, chest pain, headaches, and dizziness.  rx bottles and pill box verified.   BP 128/60 (BP Location: Left Arm, Patient Position: Sitting, Cuff Size: Normal)   Pulse 76   Resp 16   Wt 260 lb (117.9 kg)   LMP 11/27/2012   SpO2 97%   BMI 44.63 kg/m   Weight yesterday-260 Last visit weight-262    Raeleigh Guinn, EMT Paramedic 02/23/2017    ACTION: Home visit completed

## 2017-03-01 ENCOUNTER — Other Ambulatory Visit (HOSPITAL_COMMUNITY): Payer: Self-pay

## 2017-03-01 ENCOUNTER — Telehealth (HOSPITAL_COMMUNITY): Payer: Self-pay | Admitting: Pharmacist

## 2017-03-01 ENCOUNTER — Other Ambulatory Visit (HOSPITAL_COMMUNITY): Payer: Self-pay | Admitting: Pharmacist

## 2017-03-01 DIAGNOSIS — I5043 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) and diastolic (congestive) heart failure: Secondary | ICD-10-CM

## 2017-03-01 MED ORDER — POTASSIUM CHLORIDE CRYS ER 20 MEQ PO TBCR: 20.0000 meq | EXTENDED_RELEASE_TABLET | Freq: Every day | ORAL | 6 refills | Status: DC

## 2017-03-01 NOTE — Telephone Encounter (Signed)
Received a message from [place] with paramedicine that there are 2 different directions for [name]'s potassium chloride. Based on the last clinic note, she should continue KCl 20 mEq daily with an additional 20 mEq tablet on metolazone days. Med list updated.   [name]. [name], PharmD, BCPS, CPP Clinical Pharmacist Pager: [phone] Phone: [phone] 03/01/2017 3:39 PM

## 2017-03-01 NOTE — Progress Notes (Signed)
Paramedicine Encounter    Patient ID: [name], female    DOB: 08-19-1961, 56 y.o.   MRN: [id number]   Patient Care Team: Sandi Mariscal, MD as PCP - General (Internal Medicine) [name], MD as Consulting Physician (Cardiology) [name], MD as Consulting Physician (Pulmonary Disease)  Patient Active Problem List   Diagnosis Date Noted  . CKD (chronic kidney disease), stage III 11/24/2016  . Suspected sleep apnea 11/24/2016  . Severe obesity (BMI >= 40) ([place]) 02/24/2016  . COPD GOLD 0  02/24/2016  . Chronic systolic CHF (congestive heart failure) ([place]) 02/12/2016  . Encounter for therapeutic drug monitoring 02/10/2016  . Symptomatic bradycardia 01/12/2016  . Essential hypertension 12/22/2015  . Wheeze   . 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 Tobacco abuse 10/23/2015  . Coronary artery disease involving native coronary artery of native heart   . DOE (dyspnea on exertion) 04/29/2015  . PAF (paroxysmal atrial fibrillation) ([place]) 01/16/2015  . Carotid arterial disease ([place]) 01/16/2015  . Claudication ([place]) 01/15/2015  . Demand ischemia ([place]) 10/29/2014  . Insomnia 02/03/2014  . S/P peripheral artery angioplasty - TurboHawk atherectomy; R SFA 09/11/2013    Class: Acute  . Leg pain, bilateral 08/19/2013  . Hypothyroidism 07/31/2013  . Cellulitis 06/13/2013  . History of cocaine abuse 06/13/2013  . Long term current use of anticoagulant therapy 05/20/2013  . Alcohol abuse   . Narcotic abuse   . Marijuana abuse   . Alcoholic cirrhosis ([place])   . DM (diabetes mellitus), type 2 with peripheral vascular complications ([name])     Current Outpatient Prescriptions:  .  albuterol (PROVENTIL HFA;VENTOLIN HFA) 108 (90 Base) MCG/ACT inhaler, Inhale 2 puffs into the lungs every 6 (six) hours as needed for wheezing or shortness of breath., Disp: 1 each, Rfl: 3 .  albuterol (PROVENTIL) (2.5 MG/3ML) 0.083% nebulizer solution, Take 3 mLs (2.5 mg total) by nebulization every 6 (six)  hours as needed. For wheezing., Disp: 75 mL, Rfl: 2 .  amiodarone (PACERONE) 100 MG tablet, TAKE 1 TABLET BY MOUTH ONCE DAILY, Disp: 30 tablet, Rfl: 10 .  amLODipine (NORVASC) 10 MG tablet, Take 10 mg by mouth daily., Disp: , Rfl:  .  atorvastatin (LIPITOR) 40 MG tablet, TAKE 1 TABLET BY MOUTH ONCE EVERY EVENING, Disp: 90 tablet, Rfl: 3 .  bisoprolol (ZEBETA) 5 MG tablet, TAKE 1/2 TABLET BY MOUTH EVERY DAY, Disp: 15 tablet, Rfl: 2 .  Blood Glucose Monitoring Suppl (ACCU-CHEK AVIVA PLUS) W/DEVICE KIT, 1 Device by Does not apply route 4 (four) times daily -  before meals and at bedtime., Disp: 1 kit, Rfl: 0 .  cholecalciferol (VITAMIN D) 1000 units tablet, Take 1,000 Units by mouth daily., Disp: , Rfl:  .  cyanocobalamin 1000 MCG tablet, Take 1 tablet (1,000 mcg total) by mouth daily., Disp: 30 tablet, Rfl: 10 .  folic acid (FOLVITE) 1 MG tablet, TAKE 1 TABLET BY MOUTH EVERY DAY, Disp: 90 tablet, Rfl: 1 .  insulin aspart (NOVOLOG FLEXPEN) 100 UNIT/ML FlexPen, Inject 24-44 Units into the skin 3 (three) times daily with meals. , Disp: , Rfl:  .  Insulin Glargine (LANTUS SOLOSTAR) 100 UNIT/ML Solostar Pen, Inject 80 Units into the skin daily at 10 pm. , Disp: , Rfl:  .  Insulin Pen Needle (ULTICARE MICRO PEN NEEDLES) 32G X 4 MM MISC, 1 Syringe by Does not apply route 4 (four) times daily - after meals and at bedtime., Disp: 1 each, Rfl: 11 .  Lancets (ACCU-CHEK  SOFT TOUCH) lancets, Use as instructed, Disp: 100 each, Rfl: 12 .  levothyroxine (SYNTHROID, LEVOTHROID) 50 MCG tablet, Take 1 tablet (50 mcg total) by mouth daily., Disp: 30 tablet, Rfl: 2 .  losartan (COZAAR) 25 MG tablet, Take 1 tablet (25 mg total) by mouth daily., Disp: 21 tablet, Rfl: 0 .  metolazone (ZAROXOLYN) 2.5 MG tablet, TAKE 1 TABLET BY MOUTH ONCE A WEEK ON THURSDAY. MAY TAKE AN ADDITIONAL TABLET AS DIRECTED BY HF CLINIC., Disp: 5 tablet, Rfl: 4 .  spironolactone (ALDACTONE) 25 MG tablet, Take 1 tablet (25 mg total) by mouth daily.,  Disp: 90 tablet, Rfl: 3 .  SYMBICORT 160-4.5 MCG/ACT inhaler, INHALE 2 PUFFS INTO THE LUNGS TWICE DAILY, Disp: 1 Inhaler, Rfl: 11 .  gabapentin (NEURONTIN) 600 MG tablet, Take 300 mg by mouth at bedtime. , Disp: , Rfl:  .  nitroGLYCERIN (NITROSTAT) 0.4 MG SL tablet, Place 1 tablet (0.4 mg total) under the tongue every 5 (five) minutes as needed for chest pain. (Patient not taking: Reported on 02/09/2017), Disp: 25 tablet, Rfl: 3 .  potassium chloride SA (K-DUR,KLOR-CON) 20 MEQ tablet, Take 1 tablet (20 mEq total) by mouth daily. Take an additional 20 meq (1 tab) on Thursday with metolazone, Disp: 35 tablet, Rfl: 6 .  torsemide (DEMADEX) 20 MG tablet, Take 2 tablets (40 mg total) by mouth 2 (two) times daily., Disp: 360 tablet, Rfl: 3 .  warfarin (COUMADIN) 5 MG tablet, TAKE 1 TABLET TO 1 AND 1/2 TABLETS BY MOUTH EVERY DAY AS DIRECTED BY COUMADIN CLINIC, Disp: 45 tablet, Rfl: 2 Allergies  Allergen Reactions  . Gabapentin Nausea And Vomiting    POSSIBLE SHAKING? TAKING MED CURRENTLY     Social History   Social History  . Marital status: Single    Spouse name: N/A  . Number of children: N/A  . Years of education: N/A   Occupational History  . disabled    Social History Main Topics  . Smoking status: Former Smoker    Packs/day: 0.00    Years: 40.00    Types: E-cigarettes    Quit date: 11/03/2015  . Smokeless tobacco: Former Systems developer  . Alcohol use No  . Drug use: No     Comment: 04/29/2015 "last drug use was ~ 09/08/2013"  . Sexual activity: No   Other Topics Concern  . Not on file   Social History Narrative   Lives in [place], in [place] with sister.  They are looking to move but don't have a place to go yet.      Physical Exam      Future Appointments Date Time Provider [place]  03/16/2017 1:30 PM [facility]  03/17/2017 1:00 PM [facility]   BP 140/82   Pulse 86   Resp 16   Wt 260 lb (117.9 kg)   LMP  11/27/2012   SpO2 98%   BMI 44.63 kg/m  Weight yesterday-260 Last visit weight-260 CBG PTA-387 [facility]G EMS-408  Pt reports she feels ok other than her foot pain. She went to wound clinic today and she was prescribed another antiobiotic. Her legs are still wrapped, legs are back draining again.  She is still out of her gabapentin. She plans on going to her PCP soon for the refills. She states her pains have increased since she has been without it. She hasnt had any in about a week. Her knees are swollen. Nurse came out last night to wrap them again.  Theres 2 potassium doses in her chart so I verified with erika and she advised to do the 55mq daily with an additional 281m on metolazone day.  She has not taken her dose of insulin for this afternoon yet.   ACTION: Home visit completed Next visit planned for next week   KaMarylouise StacksEMT-Paramedic 03/01/17

## 2017-03-08 IMAGING — DX DG HIP (WITH OR WITHOUT PELVIS) 2-3V*L*
3 series · 3 of 3 positions shown · non-contrast
Comparison: None.

CLINICAL DATA: Acute onset of left hip pain.  Initial encounter.

EXAM:
DG HIP (WITH OR WITHOUT PELVIS) 2-3V LEFT

[pelvis ap]
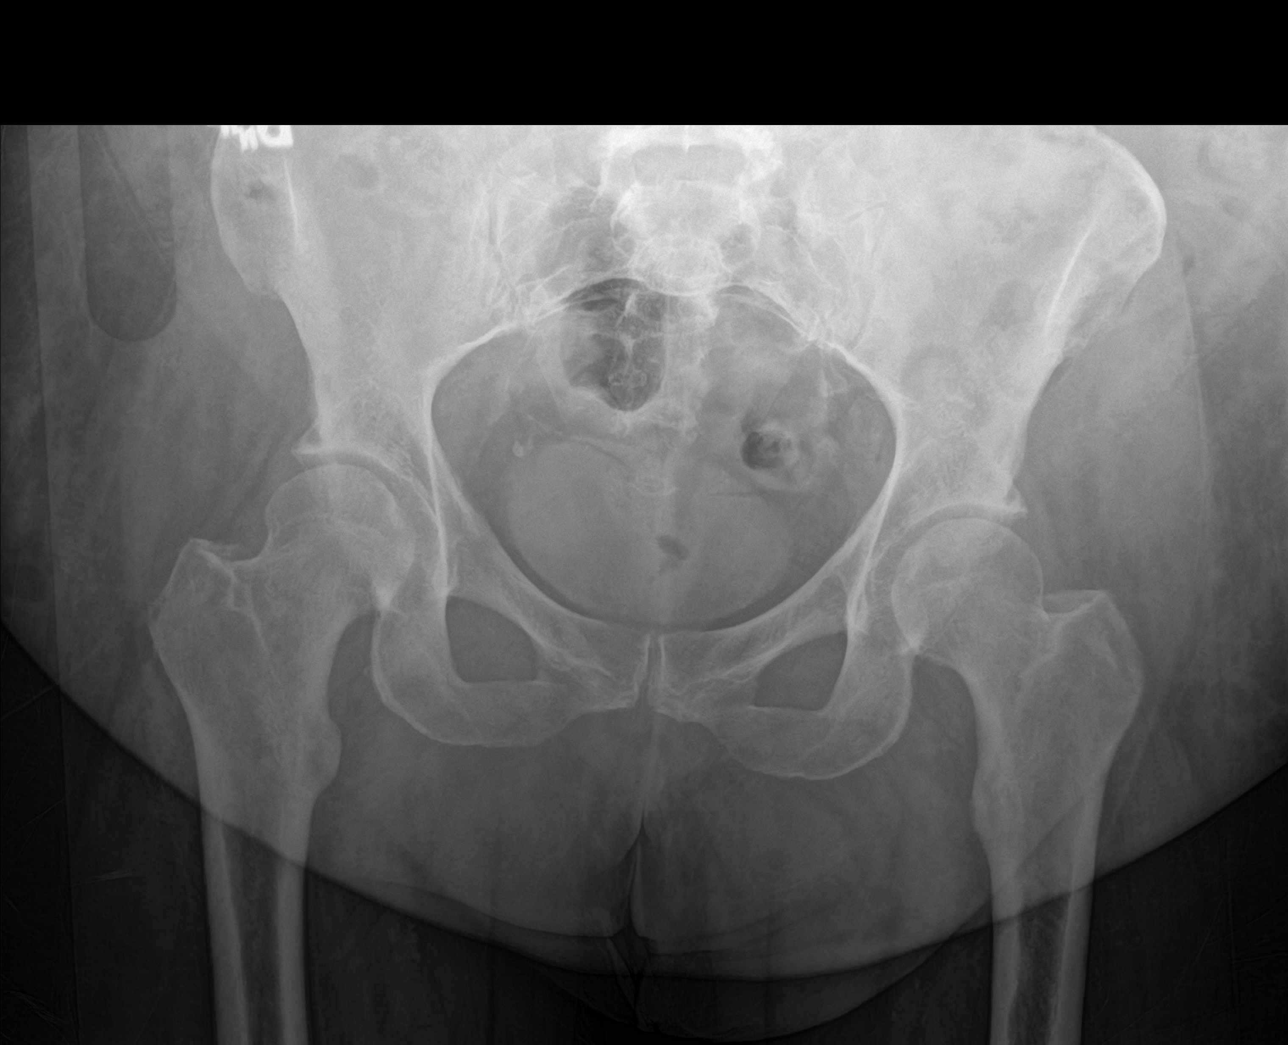

[hip ap]
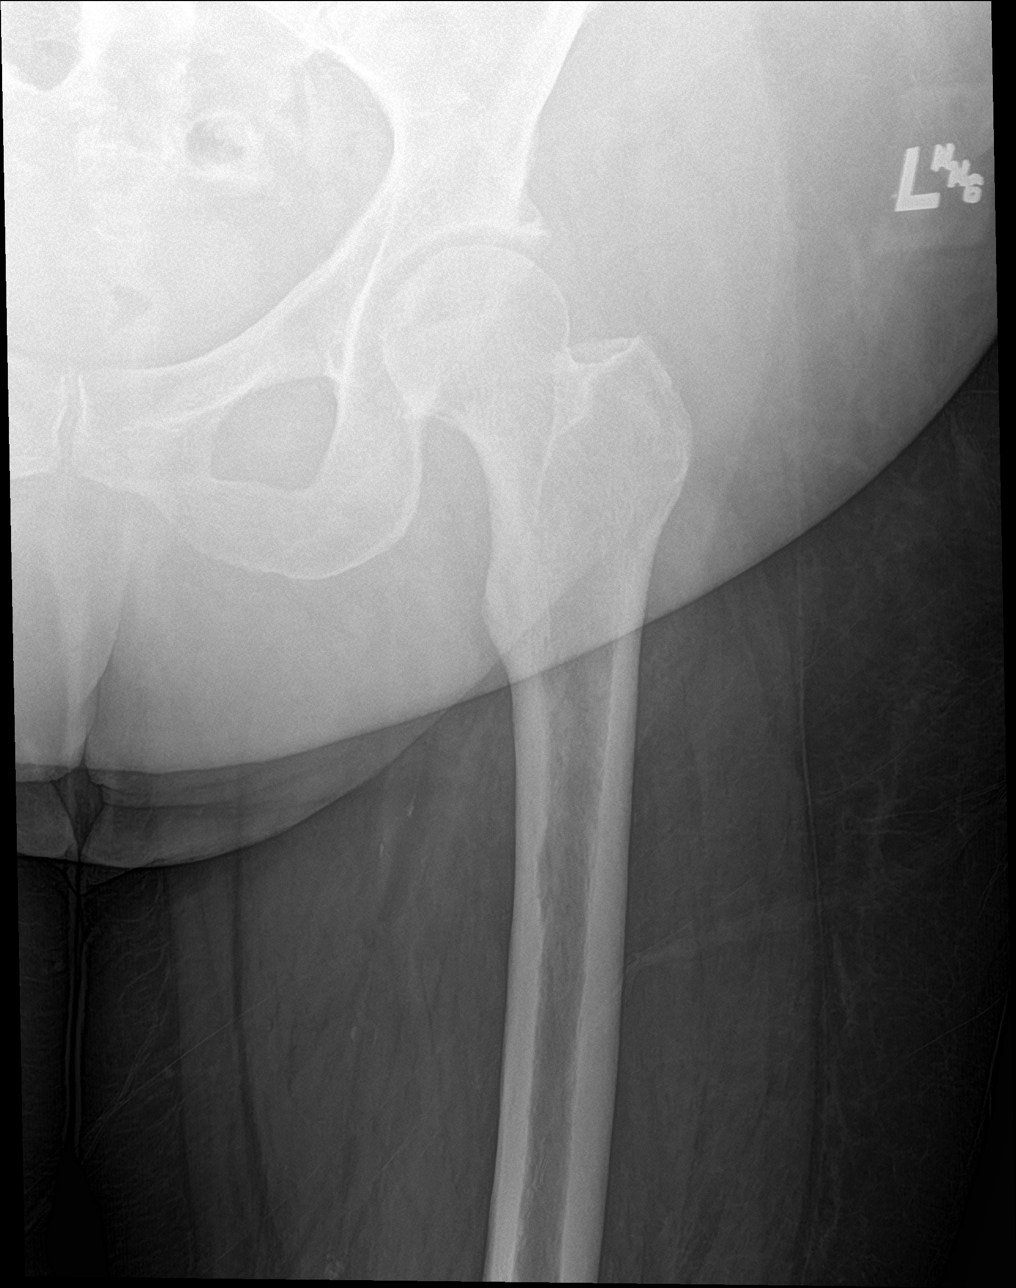

[hip lat]
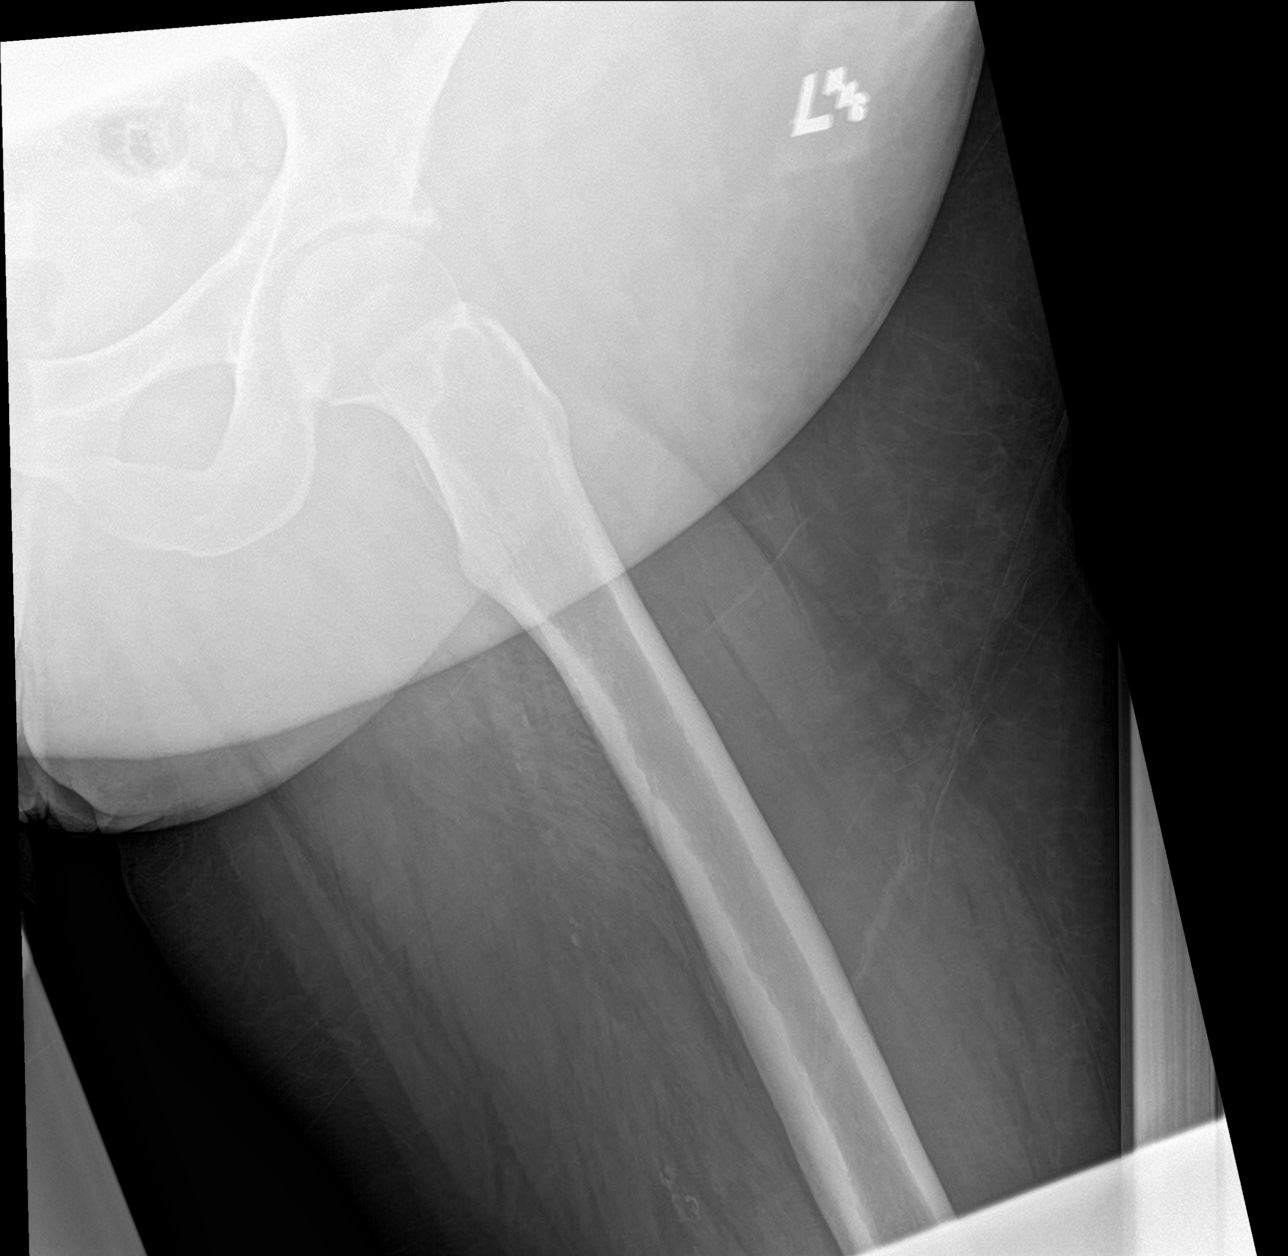

[3 of 3 positions shown; findings below may reference images not displayed]

FINDINGS: There is no evidence of fracture or dislocation. Both femoral heads
are seated normally within their respective acetabula. The proximal
left femur appears intact. No significant degenerative change is
appreciated. The sacroiliac joints are unremarkable in appearance.

The visualized bowel gas pattern is grossly unremarkable in
appearance.
IMPRESSION: No evidence of fracture or dislocation.

## 2017-03-08 IMAGING — CT CT HEAD W/O CM
4 series · 16 of 47 positions shown, 18 images · non-contrast
Comparison: None.

CLINICAL DATA: Acute onset of headache. Lethargy. Initial
encounter.

EXAM:
CT HEAD WITHOUT CONTRAST
TECHNIQUE: Contiguous axial images were obtained from the base of the skull
through the vertex without intravenous contrast.

[Series 2: head without · axial · non-contrast · 0.46mm/px · z∈[-191,-71]mm · 6 of 34 slices shown, 8 images]
[im 5/34  brain]
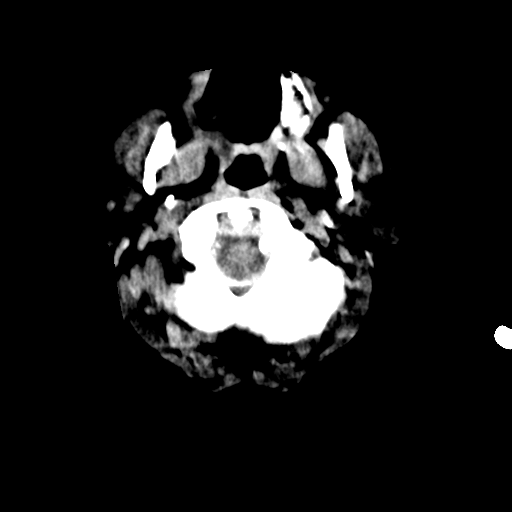
[im 5/34  bone]
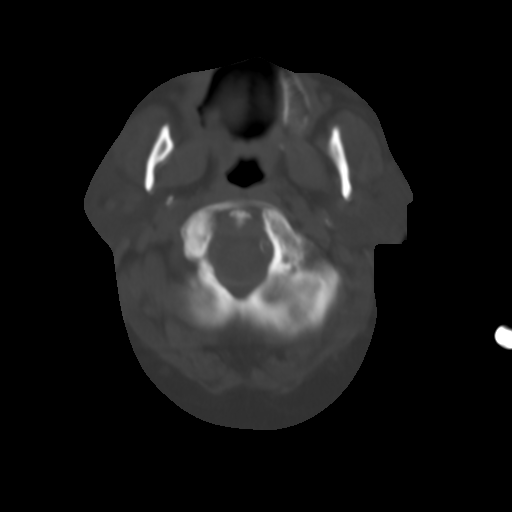
[im 10/34  brain]
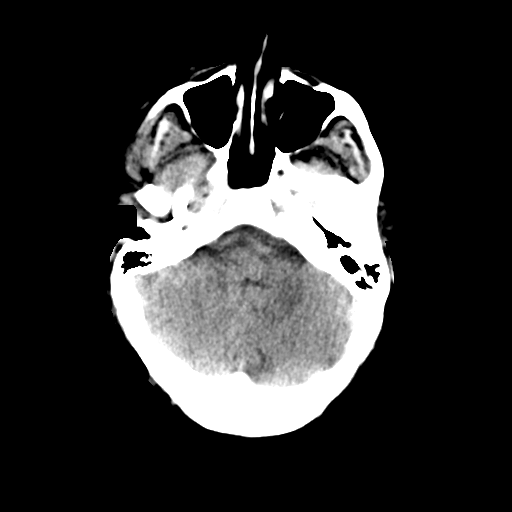
[im 15/34  brain]
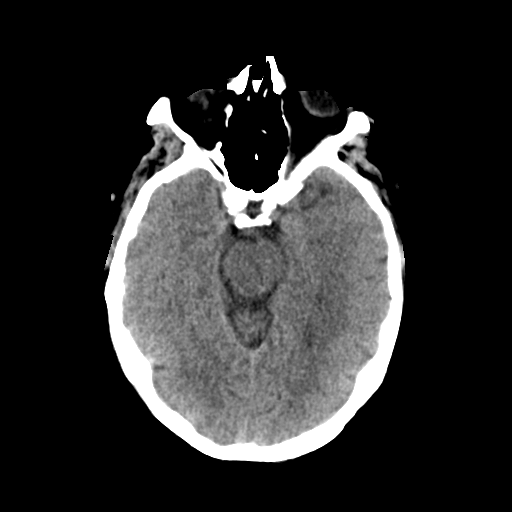
[im 19/34  brain]
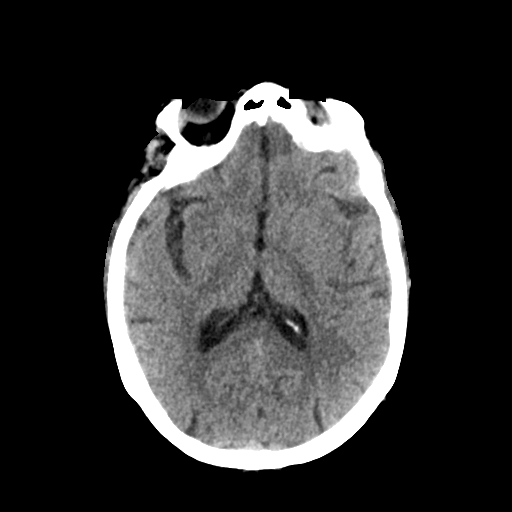
[im 24/34  brain]
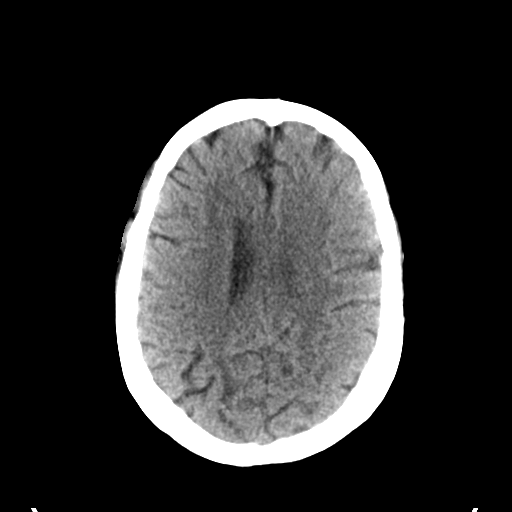
[im 24/34  bone]
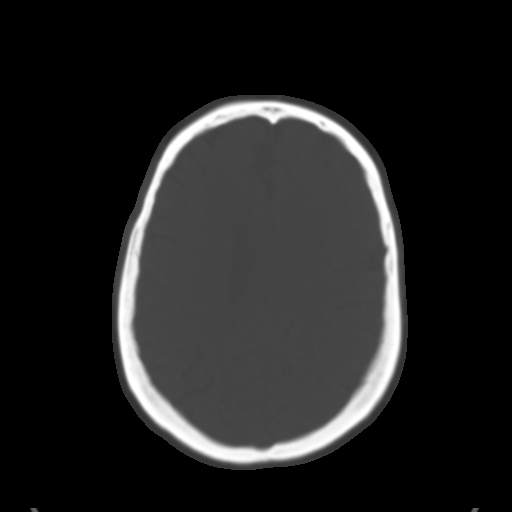
[im 29/34  brain]
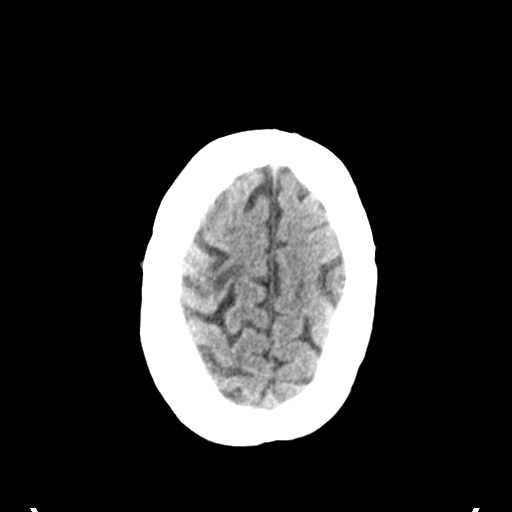

[Series 3: head bone · axial · 0.46mm/px · z∈[-195,-133]mm · 4 of 92 slices shown]
[im 9/92  bone]
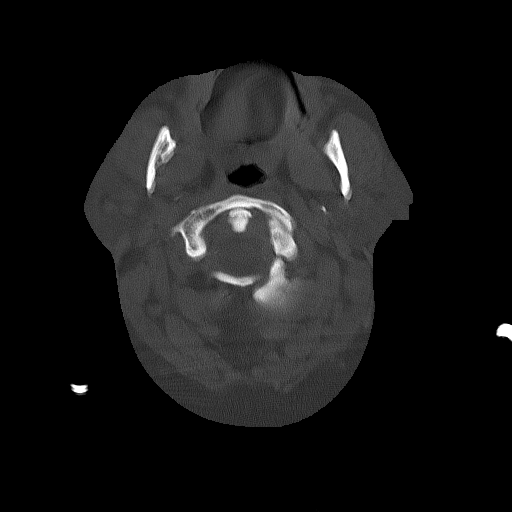
[im 18/92  bone]
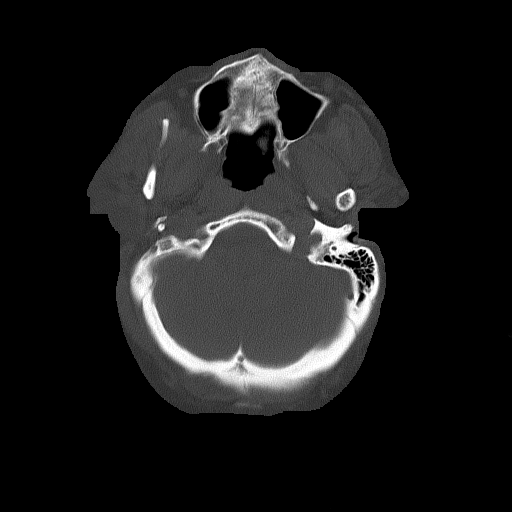
[im 31/92  bone]
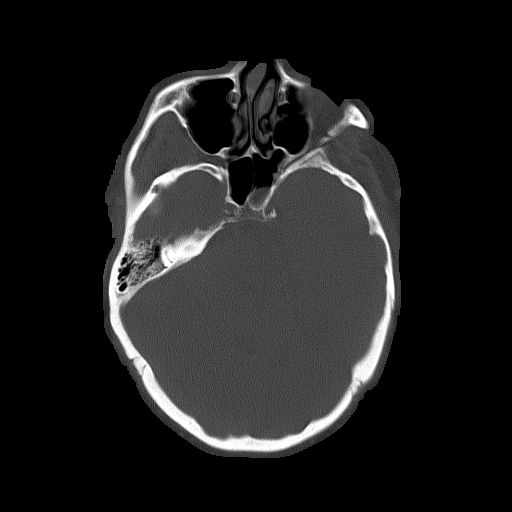
[im 40/92  bone]
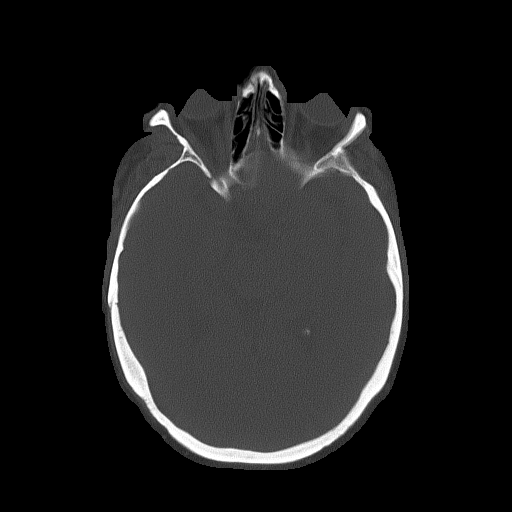

[Series 4: head without cor · coronal · non-contrast · 0.34mm/px · 3 of 73 slices shown]
[im 29/73  brain]
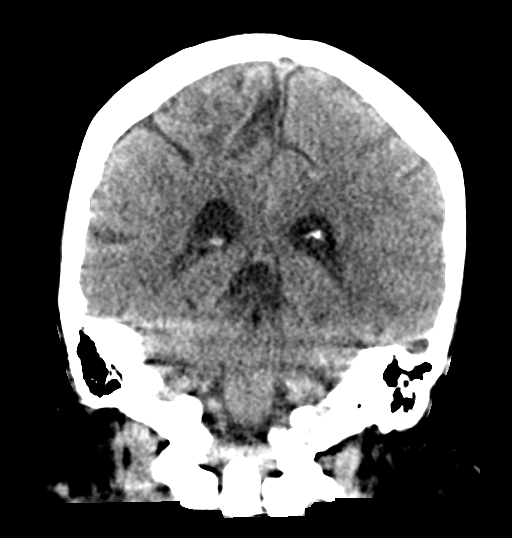
[im 34/73  brain]
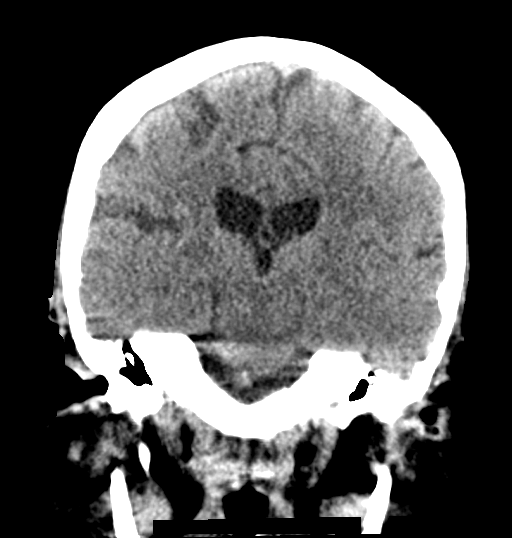
[im 39/73  brain]
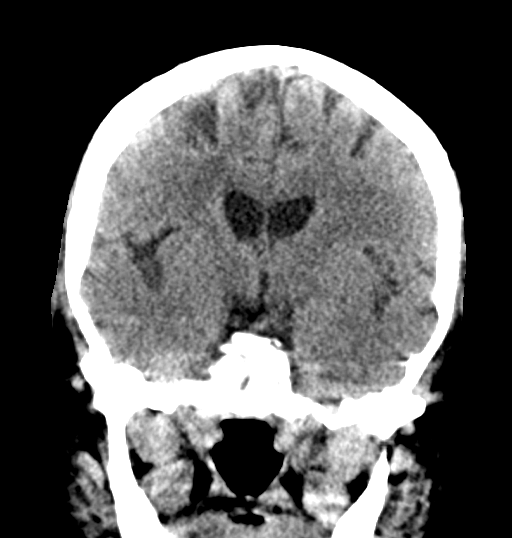

[Series 5: head without sag · sagittal · non-contrast · 0.36mm/px · 3 of 55 slices shown]
[im 19/55  brain]
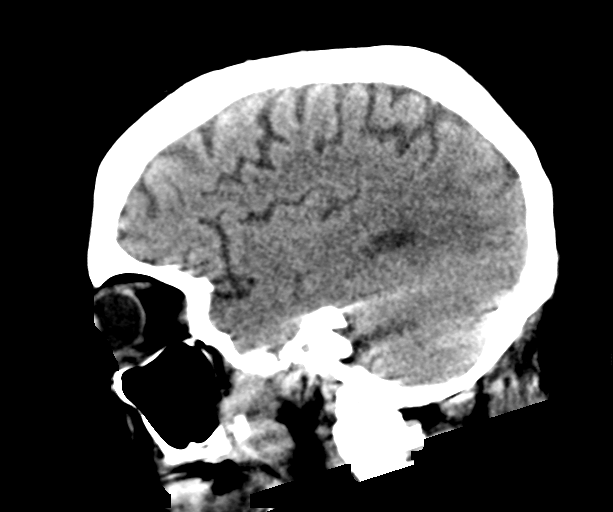
[im 28/55  brain]
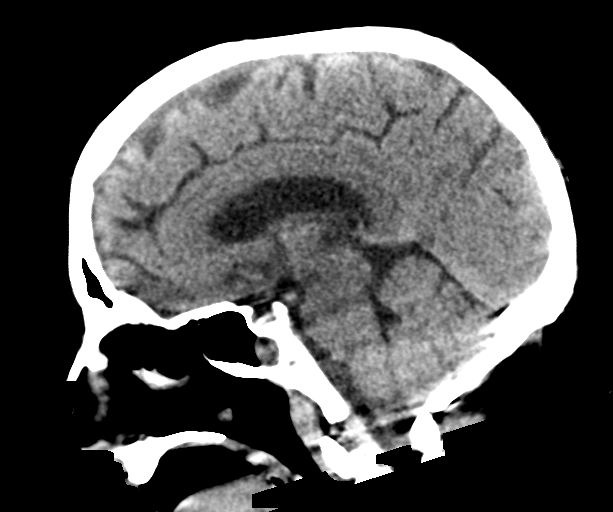
[im 37/55  brain]
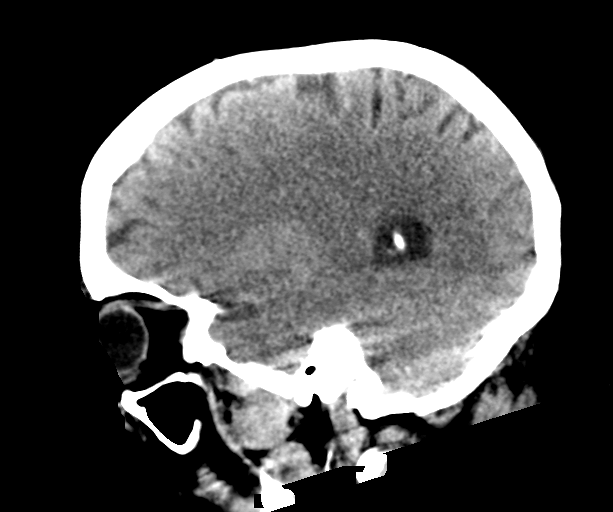

[16 of 47 positions shown; findings below may reference images not displayed]

FINDINGS: There is no evidence of acute infarction, mass lesion, or intra- or
extra-axial hemorrhage on CT.

Small infarcts are noted at the right convexity, within the right
parietal lobe. These are most likely chronic in nature. Chronic
ischemic change extends into the right basal ganglia.

The posterior fossa, including the cerebellum, brainstem and fourth
ventricle, is within normal limits. The third and lateral ventricles
are within normal limits. No mass effect or midline shift is seen.

There is no evidence of fracture; visualized osseous structures are
unremarkable in appearance. The orbits are within normal limits. The
paranasal sinuses and mastoid air cells are well-aerated. No
significant soft tissue abnormalities are seen.
IMPRESSION: 1. No acute intracranial pathology seen on CT.
2. Small infarcts at the right convexity, within the right parietal
lobe, are most likely chronic in nature.
3. Chronic ischemic change extends into the right basal ganglia.

## 2017-03-09 ENCOUNTER — Other Ambulatory Visit (HOSPITAL_COMMUNITY): Payer: Self-pay

## 2017-03-09 NOTE — Progress Notes (Signed)
Paramedicine Encounter    Patient ID: [name], female    DOB: 10-03-1961, 56 y.o.   MRN: [id number]   Patient Care Team: Sandi Mariscal, MD as PCP - General (Internal Medicine) [name], MD as Consulting Physician (Cardiology) [name], MD as Consulting Physician (Pulmonary Disease)  Patient Active Problem List   Diagnosis Date Noted  . CKD (chronic kidney disease), stage III 11/24/2016  . Suspected sleep apnea 11/24/2016  . Severe obesity (BMI >= 40) ([place]) 02/24/2016  . COPD GOLD 0  02/24/2016  . Chronic systolic CHF (congestive heart failure) ([place]) 02/12/2016  . Encounter for therapeutic drug monitoring 02/10/2016  . Symptomatic bradycardia 01/12/2016  . Essential hypertension 12/22/2015  . Wheeze   . 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 Tobacco abuse 10/23/2015  . Coronary artery disease involving native coronary artery of native heart   . DOE (dyspnea on exertion) 04/29/2015  . PAF (paroxysmal atrial fibrillation) ([place]) 01/16/2015  . Carotid arterial disease ([place]) 01/16/2015  . Claudication ([place]) 01/15/2015  . Demand ischemia ([place]) 10/29/2014  . Insomnia 02/03/2014  . S/P peripheral artery angioplasty - TurboHawk atherectomy; R SFA 09/11/2013    Class: Acute  . Leg pain, bilateral 08/19/2013  . Hypothyroidism 07/31/2013  . Cellulitis 06/13/2013  . History of cocaine abuse 06/13/2013  . Long term current use of anticoagulant therapy 05/20/2013  . Alcohol abuse   . Narcotic abuse   . Marijuana abuse   . Alcoholic cirrhosis ([place])   . DM (diabetes mellitus), type 2 with peripheral vascular complications ([name])     Current Outpatient Prescriptions:  .  albuterol (PROVENTIL HFA;VENTOLIN HFA) 108 (90 Base) MCG/ACT inhaler, Inhale 2 puffs into the lungs every 6 (six) hours as needed for wheezing or shortness of breath., Disp: 1 each, Rfl: 3 .  albuterol (PROVENTIL) (2.5 MG/3ML) 0.083% nebulizer solution, Take 3 mLs (2.5 mg total) by nebulization every 6 (six)  hours as needed. For wheezing., Disp: 75 mL, Rfl: 2 .  amiodarone (PACERONE) 100 MG tablet, TAKE 1 TABLET BY MOUTH ONCE DAILY, Disp: 30 tablet, Rfl: 10 .  amLODipine (NORVASC) 10 MG tablet, Take 10 mg by mouth daily., Disp: , Rfl:  .  atorvastatin (LIPITOR) 40 MG tablet, TAKE 1 TABLET BY MOUTH ONCE EVERY EVENING, Disp: 90 tablet, Rfl: 3 .  bisoprolol (ZEBETA) 5 MG tablet, TAKE 1/2 TABLET BY MOUTH EVERY DAY, Disp: 15 tablet, Rfl: 2 .  Blood Glucose Monitoring Suppl (ACCU-CHEK AVIVA PLUS) W/DEVICE KIT, 1 Device by Does not apply route 4 (four) times daily -  before meals and at bedtime., Disp: 1 kit, Rfl: 0 .  cholecalciferol (VITAMIN D) 1000 units tablet, Take 1,000 Units by mouth daily., Disp: , Rfl:  .  cyanocobalamin 1000 MCG tablet, Take 1 tablet (1,000 mcg total) by mouth daily., Disp: 30 tablet, Rfl: 10 .  folic acid (FOLVITE) 1 MG tablet, TAKE 1 TABLET BY MOUTH EVERY DAY, Disp: 90 tablet, Rfl: 1 .  gabapentin (NEURONTIN) 600 MG tablet, Take 300 mg by mouth at bedtime. , Disp: , Rfl:  .  insulin aspart (NOVOLOG FLEXPEN) 100 UNIT/ML FlexPen, Inject 24-44 Units into the skin 3 (three) times daily with meals. , Disp: , Rfl:  .  Insulin Glargine (LANTUS SOLOSTAR) 100 UNIT/ML Solostar Pen, Inject 80 Units into the skin daily at 10 pm. , Disp: , Rfl:  .  Insulin Pen Needle (ULTICARE MICRO PEN NEEDLES) 32G X 4 MM MISC, 1 Syringe by Does not apply route  4 (four) times daily - after meals and at bedtime., Disp: 1 each, Rfl: 11 .  Lancets (ACCU-CHEK SOFT TOUCH) lancets, Use as instructed, Disp: 100 each, Rfl: 12 .  levothyroxine (SYNTHROID, LEVOTHROID) 50 MCG tablet, Take 1 tablet (50 mcg total) by mouth daily., Disp: 30 tablet, Rfl: 2 .  losartan (COZAAR) 25 MG tablet, Take 1 tablet (25 mg total) by mouth daily., Disp: 21 tablet, Rfl: 0 .  metolazone (ZAROXOLYN) 2.5 MG tablet, TAKE 1 TABLET BY MOUTH ONCE A WEEK ON THURSDAY. MAY TAKE AN ADDITIONAL TABLET AS DIRECTED BY HF CLINIC., Disp: 5 tablet, Rfl:  4 .  Multiple Vitamin (MULTIVITAMIN) tablet, Take 1 tablet by mouth daily., Disp: , Rfl:  .  potassium chloride SA (K-DUR,KLOR-CON) 20 MEQ tablet, Take 1 tablet (20 mEq total) by mouth daily. Take an additional 20 meq (1 tab) on Thursday with metolazone, Disp: 35 tablet, Rfl: 6 .  spironolactone (ALDACTONE) 25 MG tablet, Take 1 tablet (25 mg total) by mouth daily., Disp: 90 tablet, Rfl: 3 .  SYMBICORT 160-4.5 MCG/ACT inhaler, INHALE 2 PUFFS INTO THE LUNGS TWICE DAILY, Disp: 1 Inhaler, Rfl: 11 .  torsemide (DEMADEX) 20 MG tablet, Take 2 tablets (40 mg total) by mouth 2 (two) times daily., Disp: 360 tablet, Rfl: 3 .  warfarin (COUMADIN) 5 MG tablet, TAKE 1 TABLET TO 1 AND 1/2 TABLETS BY MOUTH EVERY DAY AS DIRECTED BY COUMADIN CLINIC, Disp: 45 tablet, Rfl: 2 .  nitroGLYCERIN (NITROSTAT) 0.4 MG SL tablet, Place 1 tablet (0.4 mg total) under the tongue every 5 (five) minutes as needed for chest pain. (Patient not taking: Reported on 02/09/2017), Disp: 25 tablet, Rfl: 3 Allergies  Allergen Reactions  . Gabapentin Nausea And Vomiting    POSSIBLE SHAKING? TAKING MED CURRENTLY     Social History   Social History  . Marital status: Single    Spouse name: N/A  . Number of children: N/A  . Years of education: N/A   Occupational History  . disabled    Social History Main Topics  . Smoking status: Former Smoker    Packs/day: 0.00    Years: 40.00    Types: E-cigarettes    Quit date: 11/03/2015  . Smokeless tobacco: Former Systems developer  . Alcohol use No  . Drug use: No     Comment: 04/29/2015 "last drug use was ~ 09/08/2013"  . Sexual activity: No   Other Topics Concern  . Not on file   Social History Narrative   Lives in [place], in [place] with sister.  They are looking to move but don't have a place to go yet.      Physical Exam  Constitutional: She is oriented to person, place, and time.  Pulmonary/Chest: Effort normal. No respiratory distress. She has wheezes. She has no rales. She exhibits no  tenderness.  Slight wheeze to rt side upper and lower, she did not use her inhaler this morning   Abdominal: Soft.  Musculoskeletal: She exhibits no edema.  Neurological: She is oriented to person, place, and time.  Skin: Skin is warm and dry.        Future Appointments Date Time Provider [place]  03/16/2017 1:30 PM [facility]  03/17/2017 1:00 PM [facility]  03/21/2017 12:30 PM [facility]   BP 130/70   Pulse 82   Resp 16   Wt 264 lb 12.8 oz (120.1 kg)   LMP 11/27/2012   SpO2 98%  BMI 45.45 kg/m  Weight yesterday-264 Last visit weight-260 CBG PTA-303 [facility]G EMS-419  Pt reports she is doing good, she has the normal complaint of leg pain, she is going to wound clinic tomor but will be changing to [facility] wound office on may 8. Her weight is up 4lbs from last visit- She did her own pill box yesterday-that was checked-overall her meds were correctly placed in box.  Will come back out next week for another check, I look to see her d/c in a few wks as long as her pill box is correct when she does it.  She did not use her inhaler this morning, she has some slight wheezing to the rt side, no sob, no dizziness, no h/a.  Legs are still wrapped.  CBG elevated, still not been seen by PCP about her CBGs. She took her insulin this morning around 10am with breakfast, she took more insulin at the end of our visit with a snack. Home health speech therapist arrived at end of our visit also. Will see next week, same plan-she will fill pill box on Wednesday and then I will come out for visit Thursday. Will make a med sheet list for her to go by and reference too.   ACTION: Home visit completed Next visit planned for next week   Marylouise Stacks, EMT-Paramedic 03/09/17

## 2017-03-10 ENCOUNTER — Telehealth (HOSPITAL_COMMUNITY): Payer: Self-pay | Admitting: Cardiology

## 2017-03-10 NOTE — Telephone Encounter (Signed)
Barnie Del, with the [facility] called to report patients second culture has come back positive for PSEUDOMONAS with active infection in her LE. They would like to treat with Cipro x 14 days However there is a drug to drug interaction as patent is on Amiodarone. Patient has been treated topically in the past    Cr 1.69-1.7   (will need to adjust dose based on CrCl) ekg- QT is borderline   Above reviewed with Dr Aundra Dubin and Eudelia Bunch, PharmD [facility] to start cipro with risk of QT prolongation. Patient will need an EKG 5-7 days after starting treatment.    Left message for Barnie Del to return call

## 2017-03-15 ENCOUNTER — Encounter (HOSPITAL_COMMUNITY): Payer: Self-pay

## 2017-03-15 ENCOUNTER — Emergency Department (HOSPITAL_COMMUNITY)
Admission: EM | Admit: 2017-03-15 | Discharge: 2017-03-15 | Disposition: A | Discharge status: Home / Self Care | Payer: Medicare Other | Attending: Emergency Medicine | Admitting: Emergency Medicine

## 2017-03-15 ENCOUNTER — Emergency Department (HOSPITAL_COMMUNITY): Payer: Medicare Other

## 2017-03-15 DIAGNOSIS — J449 Chronic obstructive pulmonary disease, unspecified: Secondary | ICD-10-CM | POA: Insufficient documentation

## 2017-03-15 DIAGNOSIS — F17210 Nicotine dependence, cigarettes, uncomplicated: Secondary | ICD-10-CM | POA: Insufficient documentation

## 2017-03-15 DIAGNOSIS — I130 Hypertensive heart and chronic kidney disease with heart failure and stage 1 through stage 4 chronic kidney disease, or unspecified chronic kidney disease: Secondary | ICD-10-CM | POA: Insufficient documentation

## 2017-03-15 DIAGNOSIS — L03116 Cellulitis of left lower limb: Secondary | ICD-10-CM | POA: Diagnosis not present

## 2017-03-15 DIAGNOSIS — I2510 Atherosclerotic heart disease of native coronary artery without angina pectoris: Secondary | ICD-10-CM | POA: Diagnosis not present

## 2017-03-15 DIAGNOSIS — M79605 Pain in left leg: Secondary | ICD-10-CM | POA: Diagnosis present

## 2017-03-15 DIAGNOSIS — N183 Chronic kidney disease, stage 3 (moderate): Secondary | ICD-10-CM | POA: Diagnosis not present

## 2017-03-15 DIAGNOSIS — E039 Hypothyroidism, unspecified: Secondary | ICD-10-CM | POA: Diagnosis not present

## 2017-03-15 DIAGNOSIS — I5022 Chronic systolic (congestive) heart failure: Secondary | ICD-10-CM | POA: Diagnosis not present

## 2017-03-15 LAB — CBC WITH DIFFERENTIAL/PLATELET
Basophils Absolute: 0 10*3/uL (ref 0.0–0.1)
Basophils Relative: 0 %
EOS PCT: 1 %
Eosinophils Absolute: 0.2 10*3/uL (ref 0.0–0.7)
HCT: 37.1 % (ref 36.0–46.0)
Hemoglobin: 12.3 g/dL (ref 12.0–15.0)
LYMPHS ABS: 2.9 10*3/uL (ref 0.7–4.0)
LYMPHS PCT: 15 %
MCH: 30.8 pg (ref 26.0–34.0)
MCHC: 33.2 g/dL (ref 30.0–36.0)
MCV: 93 fL (ref 78.0–100.0)
MONOS PCT: 7 %
Monocytes Absolute: 1.3 10*3/uL — ABNORMAL HIGH (ref 0.1–1.0)
Neutro Abs: 14.3 10*3/uL — ABNORMAL HIGH (ref 1.7–7.7)
Neutrophils Relative %: 77 %
PLATELETS: 434 10*3/uL — AB (ref 150–400)
RBC: 3.99 MIL/uL (ref 3.87–5.11)
RDW: 15.3 % (ref 11.5–15.5)
WBC: 18.7 10*3/uL — AB (ref 4.0–10.5)

## 2017-03-15 LAB — COMPREHENSIVE METABOLIC PANEL WITH GFR
ALT: 26 U/L (ref 14–54)
AST: 33 U/L (ref 15–41)
Albumin: 3.6 g/dL (ref 3.5–5.0)
Alkaline Phosphatase: 98 U/L (ref 38–126)
Anion gap: 11 (ref 5–15)
BUN: 57 mg/dL — ABNORMAL HIGH (ref 6–20)
CO2: 28 mmol/L (ref 22–32)
Calcium: 9.6 mg/dL (ref 8.9–10.3)
Chloride: 97 mmol/L — ABNORMAL LOW (ref 101–111)
Creatinine, Ser: 1.74 mg/dL — ABNORMAL HIGH (ref 0.44–1.00)
GFR calc Af Amer: 37 mL/min — ABNORMAL LOW
GFR calc non Af Amer: 32 mL/min — ABNORMAL LOW
Glucose, Bld: 176 mg/dL — ABNORMAL HIGH (ref 65–99)
Potassium: 4.3 mmol/L (ref 3.5–5.1)
Sodium: 136 mmol/L (ref 135–145)
Total Bilirubin: 0.5 mg/dL (ref 0.3–1.2)
Total Protein: 7.9 g/dL (ref 6.5–8.1)

## 2017-03-15 LAB — I-STAT CG4 LACTIC ACID, ED: Lactic Acid, Venous: 1.21 mmol/L (ref 0.5–1.9)

## 2017-03-15 LAB — LIPASE, BLOOD: Lipase: 34 U/L (ref 11–51)

## 2017-03-15 MED ORDER — CIPROFLOXACIN HCL 500 MG PO TABS
500.0000 mg | ORAL_TABLET | Freq: Once | ORAL | Status: AC
  Administered 2017-03-15: 500 mg via ORAL
  Filled 2017-03-15: qty 1

## 2017-03-15 MED ORDER — CIPROFLOXACIN HCL 500 MG PO TABS: 500.0000 mg | ORAL_TABLET | Freq: Two times a day (BID) | ORAL | 0 refills | Status: DC

## 2017-03-15 MED ORDER — OXYCODONE-ACETAMINOPHEN 5-325 MG PO TABS
1.0000 | ORAL_TABLET | Freq: Once | ORAL | Status: AC
  Administered 2017-03-15: 1 via ORAL
  Filled 2017-03-15: qty 1

## 2017-03-15 MED ORDER — SODIUM CHLORIDE 0.9 % IV BOLUS (SEPSIS): 500.0000 mL | Freq: Once | INTRAVENOUS | Status: DC

## 2017-03-15 MED ORDER — OXYCODONE-ACETAMINOPHEN 5-325 MG PO TABS: 2.0000 | ORAL_TABLET | ORAL | 0 refills | Status: DC | PRN

## 2017-03-15 NOTE — ED Triage Notes (Signed)
Per EMS- patient has been going to the wound center for wounds to bilateral legs. Patient states the left leg has been weeping. Patient c/o noting a green area to the bottom of the left foot. patient denies a fever, but c/o increased pain to the left leg.

## 2017-03-15 NOTE — ED Notes (Addendum)
Using clean technique, applied 5, 5 x 9 Xeroform to lower left leg. Covered with 2 large kerlix and secured with ACE wrap. Pt tolerated it well. Perform by Duard Larsen RN.

## 2017-03-15 NOTE — ED Provider Notes (Signed)
Emergency Department Provider Note   I have reviewed the triage vital signs and the nursing notes.   HISTORY  Chief Complaint Leg Pain   HPI [name] is a 56 y.o. female with PMH of EtOH abuse and cirrhosis, CAD, COPD, DM, HLD, HTN, and critical lower limb ischemia with chronic lower extremity wounds managed [facility] for evaluation of discoloration to the bottom of the left foot in the setting of worsening cellulitis. Patient states that her left leg continues to feel uncomfortable and has drainage and ulcerations. She states that a nurse comes the house 2 days a week to change her dressings and she goes to the wound care center every week for additional treatment. The patient was recultured recently and found to have pseudomonas. She was prescribed Cipro but the patient states that she does not take it because she hurt it would interact with one of her medications and was waiting to hear from her cardiologist. She states that she had not been told that this medication was okay for her to take yet. She denies any fevers or chills. She does have some increasing pain in the leg. She tried taking Neurontin but this caused her to have some shaking and so she stopped taking it. Her wound care nurse came today for dressing change and noted some greenish discoloration to the bottom of the foot which seemed new and referred her to the emergency department for further testing and evaluation.    Past Medical History:  Diagnosis Date  . Alcohol abuse   . Alcoholic cirrhosis ([place])   . Alcoholic cirrhosis ([place])   . Anemia   . Anxiety   . B12 deficiency   . CAD (coronary artery disease)    a. cath 11/10/2014 LM nl, LAD min irregs, D1 30 ost, D2 50d, LCX 35m, OM1 80 p/m (1.5 mm vessel), OM2 29m, RCA nondom 72m-->med rx.. Demand ischemia in the setting of rapid a-fib.  . Cardiomyopathy ([place])   . Carotid artery disease ([place])    a. 01/2015 Carotid Angio: RICA 578, LICA  46N. s/p L carotid endarterectomy 02/2015.  [name] Chronic combined systolic and diastolic CHF (congestive heart failure) ([place])    a. Last echo 12/205: EF 40-45%, inferoapical/posterior HK, not technically sufficient to allow eval of LV diastolic function, normal LA in size..  . Cocaine abuse   . COPD (chronic obstructive pulmonary disease) ([place])   . Critical lower limb ischemia   . Diabetic peripheral neuropathy ([place])   . Elevated troponin    a. Chronic elevation.  [name] GERD (gastroesophageal reflux disease)   . Hyperlipemia   . Hypertension   . Hypokalemia   . Hypomagnesemia   . Hypothyroidism   . Marijuana abuse   . Narcotic abuse   . Noncompliance   . NSVT (nonsustained ventricular tachycardia) ([place])   . Obesity   . PAF (paroxysmal atrial fibrillation) ([place])   . Paroxysmal atrial tachycardia ([place])   . Paroxysmal atrial tachycardia ([place])   . Peripheral arterial disease ([place])    a. 01/2015 Angio/PTA: LSFA 100 w/ recon @ adductor canal and 1 vessel runoff via AT, RSFA 99 (atherectomy/pta) - 1 vessel runoff via diff dzs peroneal.  . Poorly controlled type 2 diabetes mellitus ([place])   . Renal insufficiency    a. 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  [name] Symptomatic bradycardia    avoid AV blocking agent per EP  . Tobacco abuse     Patient Active Problem List  Diagnosis Date Noted  . CKD (chronic kidney disease), stage III 11/24/2016  . Suspected sleep apnea 11/24/2016  . Severe obesity (BMI >= 40) ([place]) 02/24/2016  . COPD GOLD 0  02/24/2016  . Chronic systolic CHF (congestive heart failure) ([place]) 02/12/2016  . Encounter for therapeutic drug monitoring 02/10/2016  . Symptomatic bradycardia 01/12/2016  . Essential hypertension 12/22/2015  . Wheeze   . 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 Tobacco abuse 10/23/2015  . Coronary artery disease involving native coronary artery of native heart   . DOE (dyspnea on exertion) 04/29/2015  . PAF (paroxysmal atrial fibrillation) ([place]) 01/16/2015  . Carotid  arterial disease ([place]) 01/16/2015  . Claudication ([place]) 01/15/2015  . Demand ischemia ([place]) 10/29/2014  . Insomnia 02/03/2014  . S/P peripheral artery angioplasty - TurboHawk atherectomy; R SFA 09/11/2013    Class: Acute  . Leg pain, bilateral 08/19/2013  . Hypothyroidism 07/31/2013  . Cellulitis 06/13/2013  . History of cocaine abuse 06/13/2013  . Kanai Hilger term current use of anticoagulant therapy 05/20/2013  . Alcohol abuse   . Narcotic abuse   . Marijuana abuse   . Alcoholic cirrhosis ([place])   . DM (diabetes mellitus), type 2 with peripheral vascular complications [facility])     Past Surgical History:  Procedure Laterality Date  . BALLOON ANGIOPLASTY, ARTERY Right 01/15/2015   SFA/notes 01/15/2015  . CARDIAC CATHETERIZATION    . CARDIAC CATHETERIZATION N/A 01/12/2016   Procedure: Temporary Wire;  Surgeon: Minus Breeding, MD;  Location: [place] CV LAB;  Service: Cardiovascular;  Laterality: N/A;  . CARDIOVERSION  ~ 02/2013   "twice"   . CAROTID ANGIOGRAM N/A 01/15/2015   Procedure: CAROTID ANGIOGRAM;  Surgeon: [name], MD;  Location[facility] CATH LAB;  Service: Cardiovascular;  Laterality: N/A;  . DILATION AND CURETTAGE OF UTERUS  1988  . ENDARTERECTOMY Left 02/19/2015   Procedure: LEFT CAROTID ENDARTERECTOMY ;  Surgeon: Serafina Mitchell, MD;  Location: [place];  Service: Vascular;  Laterality: Left;  . LEFT HEART CATHETERIZATION WITH CORONARY ANGIOGRAM N/A 10/31/2014   Procedure: LEFT HEART CATHETERIZATION WITH CORONARY ANGIOGRAM;  Surgeon: Burnell Blanks, MD;  Location: [facility] CATH LAB;  Service: Cardiovascular;  Laterality: N/A;  . LOWER EXTREMITY ANGIOGRAM N/A 09/10/2013   Procedure: LOWER EXTREMITY ANGIOGRAM;  Surgeon: [name], MD;  Location: [facility] CATH LAB;  Service: Cardiovascular;  Laterality: N/A;  . LOWER EXTREMITY ANGIOGRAM N/A 01/15/2015   Procedure: LOWER EXTREMITY ANGIOGRAM;  Surgeon: [name], MD;  Location: [facility] CATH LAB;  Service: Cardiovascular;  Laterality:  N/A;  . PERIPHERAL ATHRECTOMY Right 01/15/2015   SFA/notes 01/15/2015    Current Outpatient Rx  . Order #: [id number] Class: Normal  . Order #: [id number] Class: Normal  . Order #: [id number] Class: Normal  . Order #: [id number] Class: Historical Med  . Order #: [id number] Class: Historical Med  . Order #: [id number] Class: Normal  . Order #: [id number] Class: Normal  . Order #: [id number] Class: Historical Med  . Order #: [id number] Class: Normal  . Order #: [id number] Class: Normal  . Order #: [id number] Class: Historical Med  . Order #: [id number] Class: Historical Med  . Order #: [id number] Class: Normal  . Order #: [id number] Class: Normal  . Order #: [id number] Class: Normal  . Order #: [id number] Class: Historical Med  . Order #: [id number] Class: Print  . Order #: [id number] Class: Normal  . Order #: [id number] Class: Normal  . Order #: [id number] Class: Normal  . Order #: [id number] Class: Normal  . Order #: [id number] Class: Normal  . Order #: [id number] Class: Normal  .  Order #: [id number] Class: Print  . Order #: [id number] Class: Normal  . Order #: [id number] Class: Normal  . Order #: [id number] Class: Print    Allergies Gabapentin  Family History  Problem Relation Age of Onset  . Hypertension Mother   . Diabetes Mother   . Cancer Mother     breast, ovarian, colon  . Clotting disorder Mother   . Heart disease Mother   . Heart attack Mother   . Breast cancer Mother   . Hypertension Father   . Heart disease Father   . Emphysema Sister     smoked    Social History Social History  Substance Use Topics  . Smoking status: Current Some Day Smoker    Packs/day: 0.00    Years: 40.00    Types: E-cigarettes    Last attempt to quit: 11/03/2015  . Smokeless tobacco: Former Systems developer    Types: Snuff  . Alcohol use No     Comment: former alcohol abuse    Review of Systems  Constitutional: No fever/chills Eyes: No visual changes. ENT: No sore throat. Cardiovascular: Denies chest pain. Respiratory: Denies  shortness of breath. Gastrointestinal: No abdominal pain.  No nausea, no vomiting.  No diarrhea.  No constipation. Genitourinary: Negative for dysuria. Musculoskeletal: Negative for back pain. Skin: Bilateral LE rash and ulcerations.  Neurological: Negative for headaches, focal weakness or numbness.  10-point ROS otherwise negative.  ____________________________________________   PHYSICAL EXAM:  VITAL SIGNS: ED Triage Vitals  Enc Vitals Group     BP 03/15/17 1721 139/73     Pulse Rate 03/15/17 1721 (!) 105     Resp 03/15/17 1721 20     Temp 03/15/17 1721 98.6 F (37 C)     Temp Source 03/15/17 1721 Oral     SpO2 03/15/17 1719 98 %     Weight 03/15/17 1725 266 lb (120.7 kg)     Height 03/15/17 1725 5\' 4"  (1.626 m)     Pain Score 03/15/17 1725 9    Constitutional: Alert and oriented. Well appearing and in no acute distress. Eyes: Conjunctivae are normal.  Head: Atraumatic. Nose: No congestion/rhinnorhea. Mouth/Throat: Mucous membranes are moist.  Neck: No stridor.   Cardiovascular: Normal rate, regular rhythm. Decreased bilateral LE circulation. Grossly normal heart sounds.   Respiratory: Normal respiratory effort.  No retractions. Lungs with faint expiratory wheezing.  Gastrointestinal: Soft and nontender. No distention.  Musculoskeletal: No lower extremity tenderness nor edema. No gross deformities of extremities. Neurologic:  Normal speech and language. No gross focal neurologic deficits are appreciated.  Skin:  Skin is warm and dry. Bilateral LE erythema with LLE shallow ulcerations medially. No fluctuance or area of abscess. Faint discoloration to the sole of the left foot, again, with no fluctuance or ulceration. No necrotic tissue. No crepitus. No extension or erythema to the knee or thigh.    ____________________________________________   LABS (all labs ordered are listed, but only abnormal results are displayed)  Labs Reviewed  CBC WITH DIFFERENTIAL/PLATELET -  Abnormal; Notable for the following:       Result Value   WBC 18.7 (*)    Platelets 434 (*)    Neutro Abs 14.3 (*)    Monocytes Absolute 1.3 (*)    All other components within normal limits  COMPREHENSIVE METABOLIC PANEL - Abnormal; Notable for the following:    Chloride 97 (*)    Glucose, Bld 176 (*)    BUN 57 (*)    Creatinine, Ser 1.74 (*)  GFR calc non Af Amer 32 (*)    GFR calc Af Amer 37 (*)    All other components within normal limits  LIPASE, BLOOD  I-STAT CG4 LACTIC ACID, ED   ____________________________________________  RADIOLOGY  Dg Foot Complete Left  Result Date: 03/15/2017 CLINICAL DATA:  Cellulitis for 1 month EXAM: LEFT FOOT - COMPLETE 3+ VIEW COMPARISON:  None. FINDINGS: There is no evidence of fracture or dislocation. There is no evidence of arthropathy or other focal bone abnormality. Soft tissues are unremarkable. IMPRESSION: No acute abnormality noted. Electronically Signed   By: Inez Catalina M.D.   On: 03/15/2017 19:39    ____________________________________________   PROCEDURES  Procedure(s) performed:   Procedures  None ____________________________________________   INITIAL IMPRESSION / ASSESSMENT AND PLAN / ED COURSE  Pertinent labs & imaging results that were available during my care of the patient were reviewed by me and considered in my medical decision making (see chart for details).  Patient resents to the emergency department for evaluation of left lower extremity ulceration, erythema, and discoloration to the bottom of the left foot. She does have some mild discoloration there but there is no fluctuance, blistering, or erythema. No open areas or drainage. The remaining left lower extremity is erythematous with some weeping and shallow ulcers that seem close to the patient's baseline. No erythema tracking up the thigh. Plan for labs, pain control, plain film to evaluate for osteomyelitis. No information to suggest necrotizing infection.  Review of telephone encounters in Epic report intention to start the patient on Cipro after Pseudomonas was cultured from the wound. There had apparently been conversation between the patient's cardiologist and pharmacy who agreed that Cipro would be okay with repeat EKG in several days.   X-ray reviewed with no gas formation or obvious osteomyelitis. No clearly new sores in the leg. The patient has chronic ulcerations and wounds from severe PAD. No evidence to suggest acute limb ischemia. No fever, chills, or other signs of systemic illness or developing sepsis. Patient with chronic pain in the LLE. Patient to be seen in wound clinic tomorrow for re-evaluation. Plan to start Cipro per outpatient plan and have her seen by wound care tomorrow. Discussed return precautions for worsening rash, pain, or fever/chills. Patient's leg was dressed by nursing prior to discharge.   At this time, I do not feel there is any life-threatening condition present. I have reviewed and discussed all results (EKG, imaging, lab, urine as appropriate), exam findings with patient. I have reviewed nursing notes and appropriate previous records.  I feel the patient is safe to be discharged home without further emergent workup. Discussed usual and customary return precautions. Patient and family (if present) verbalize understanding and are comfortable with this plan.  Patient will follow-up with their primary care provider. If they do not have a primary care provider, information for follow-up has been provided to them. All questions have been answered.  ____________________________________________  FINAL CLINICAL IMPRESSION(S) / ED DIAGNOSES  Final diagnoses:  Left leg cellulitis     MEDICATIONS GIVEN DURING THIS VISIT:  Medications  oxyCODONE-acetaminophen (PERCOCET/ROXICET) 5-325 MG per tablet 1 tablet (1 tablet Oral Given 03/15/17 1843)  ciprofloxacin (CIPRO) tablet 500 mg (500 mg Oral Given 03/15/17 2141)    oxyCODONE-acetaminophen (PERCOCET/ROXICET) 5-325 MG per tablet 1 tablet (1 tablet Oral Given 03/15/17 2141)     NEW OUTPATIENT MEDICATIONS STARTED DURING THIS VISIT:  Discharge Medication List as of 03/15/2017  9:06 PM    START taking these medications  Details  ciprofloxacin (CIPRO) 500 MG tablet Take 1 tablet (500 mg total) by mouth every 12 (twelve) hours., Starting Wed 03/15/2017, Print    oxyCODONE-acetaminophen (PERCOCET/ROXICET) 5-325 MG tablet Take 2 tablets by mouth every 4 (four) hours as needed for severe pain., Starting Wed 03/15/2017, Print          Note:  This document was prepared using Dragon voice recognition software and may include unintentional dictation errors.  Nanda Quinton, MD Emergency Medicine   Margette Fast, MD 03/16/17 [id number]

## 2017-03-15 NOTE — ED Notes (Signed)
Pt transported to radiology.

## 2017-03-15 NOTE — Discharge Instructions (Signed)
You have been seen today in the Emergency Department (ED) for cellulitis, a superficial skin infection. Please take your antibiotics as prescribed for their ENTIRE prescribed duration.  Take Tylenol or Motrin as needed for pain, but only as written on the box.  ° °Please follow up with your doctor or in the ED in 24-48 hours for recheck of your infection if you are not improving.  Call your doctor sooner or return to the ED if you develop worsening signs of infection such as: increased redness, increased pain, pus, fever, or other symptoms that concern you. ° °

## 2017-03-16 ENCOUNTER — Encounter (HOSPITAL_COMMUNITY): Payer: Self-pay

## 2017-03-16 ENCOUNTER — Ambulatory Visit (HOSPITAL_COMMUNITY)
Admission: RE | Admit: 2017-03-16 | Discharge: 2017-03-16 | Disposition: A | Discharge status: Home / Self Care | Payer: Medicare Other | Source: Ambulatory Visit | Attending: Cardiology | Admitting: Cardiology

## 2017-03-16 ENCOUNTER — Other Ambulatory Visit (HOSPITAL_COMMUNITY): Payer: Self-pay

## 2017-03-16 DIAGNOSIS — I739 Peripheral vascular disease, unspecified: Secondary | ICD-10-CM

## 2017-03-16 NOTE — Telephone Encounter (Signed)
[name] aware of recommendations and report they will manage

## 2017-03-16 NOTE — Progress Notes (Signed)
Paramedicine Encounter    Patient ID: [name], female    DOB: 09-22-1961, 56 y.o.   MRN: [id number]   Patient Care Team: Sandi Mariscal, MD as PCP - General (Internal Medicine) [name], MD as Consulting Physician (Cardiology) [name], MD as Consulting Physician (Pulmonary Disease)  Patient Active Problem List   Diagnosis Date Noted  . CKD (chronic kidney disease), stage III 11/24/2016  . Suspected sleep apnea 11/24/2016  . Severe obesity (BMI >= 40) ([place]) 02/24/2016  . COPD GOLD 0  02/24/2016  . Chronic systolic CHF (congestive heart failure) ([place]) 02/12/2016  . Encounter for therapeutic drug monitoring 02/10/2016  . Symptomatic bradycardia 01/12/2016  . Essential hypertension 12/22/2015  . Wheeze   . 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 Tobacco abuse 10/23/2015  . Coronary artery disease involving native coronary artery of native heart   . DOE (dyspnea on exertion) 04/29/2015  . PAF (paroxysmal atrial fibrillation) ([place]) 01/16/2015  . Carotid arterial disease ([place]) 01/16/2015  . Claudication ([place]) 01/15/2015  . Demand ischemia ([place]) 10/29/2014  . Insomnia 02/03/2014  . S/P peripheral artery angioplasty - TurboHawk atherectomy; R SFA 09/11/2013    Class: Acute  . Leg pain, bilateral 08/19/2013  . Hypothyroidism 07/31/2013  . Cellulitis 06/13/2013  . History of cocaine abuse 06/13/2013  . Long term current use of anticoagulant therapy 05/20/2013  . Alcohol abuse   . Narcotic abuse   . Marijuana abuse   . Alcoholic cirrhosis ([place])   . DM (diabetes mellitus), type 2 with peripheral vascular complications ([name])     Current Outpatient Prescriptions:  .  albuterol (PROVENTIL HFA;VENTOLIN HFA) 108 (90 Base) MCG/ACT inhaler, Inhale 2 puffs into the lungs every 6 (six) hours as needed for wheezing or shortness of breath., Disp: 1 each, Rfl: 3 .  albuterol (PROVENTIL) (2.5 MG/3ML) 0.083% nebulizer solution, Take 3 mLs (2.5 mg total) by nebulization every 6 (six)  hours as needed. For wheezing., Disp: 75 mL, Rfl: 2 .  amiodarone (PACERONE) 100 MG tablet, TAKE 1 TABLET BY MOUTH ONCE DAILY, Disp: 30 tablet, Rfl: 10 .  amLODipine (NORVASC) 10 MG tablet, Take 10 mg by mouth daily., Disp: , Rfl:  .  atorvastatin (LIPITOR) 40 MG tablet, TAKE 1 TABLET BY MOUTH ONCE EVERY EVENING, Disp: 90 tablet, Rfl: 3 .  bisoprolol (ZEBETA) 5 MG tablet, TAKE 1/2 TABLET BY MOUTH EVERY DAY, Disp: 15 tablet, Rfl: 2 .  Blood Glucose Monitoring Suppl (ACCU-CHEK AVIVA PLUS) W/DEVICE KIT, 1 Device by Does not apply route 4 (four) times daily -  before meals and at bedtime., Disp: 1 kit, Rfl: 0 .  cholecalciferol (VITAMIN D) 1000 units tablet, Take 1,000 Units by mouth daily., Disp: , Rfl:  .  ciprofloxacin (CIPRO) 500 MG tablet, Take 1 tablet (500 mg total) by mouth every 12 (twelve) hours., Disp: 10 tablet, Rfl: 0 .  cyanocobalamin 1000 MCG tablet, Take 1 tablet (1,000 mcg total) by mouth daily., Disp: 30 tablet, Rfl: 10 .  folic acid (FOLVITE) 1 MG tablet, TAKE 1 TABLET BY MOUTH EVERY DAY, Disp: 90 tablet, Rfl: 1 .  insulin aspart (NOVOLOG FLEXPEN) 100 UNIT/ML FlexPen, Inject 24-44 Units into the skin 3 (three) times daily with meals. , Disp: , Rfl:  .  Insulin Glargine (LANTUS SOLOSTAR) 100 UNIT/ML Solostar Pen, Inject 80 Units into the skin daily at 10 pm. , Disp: , Rfl:  .  Insulin Pen Needle (ULTICARE MICRO PEN NEEDLES) 32G X 4 MM MISC, 1 Syringe  by Does not apply route 4 (four) times daily - after meals and at bedtime., Disp: 1 each, Rfl: 11 .  Lancets (ACCU-CHEK SOFT TOUCH) lancets, Use as instructed, Disp: 100 each, Rfl: 12 .  levothyroxine (SYNTHROID, LEVOTHROID) 50 MCG tablet, Take 1 tablet (50 mcg total) by mouth daily., Disp: 30 tablet, Rfl: 2 .  losartan (COZAAR) 25 MG tablet, Take 1 tablet (25 mg total) by mouth daily., Disp: 21 tablet, Rfl: 0 .  metolazone (ZAROXOLYN) 2.5 MG tablet, TAKE 1 TABLET BY MOUTH ONCE A WEEK ON THURSDAY. MAY TAKE AN ADDITIONAL TABLET AS DIRECTED  BY HF CLINIC., Disp: 5 tablet, Rfl: 4 .  Multiple Vitamin (MULTIVITAMIN) tablet, Take 1 tablet by mouth daily., Disp: , Rfl:  .  oxyCODONE-acetaminophen (PERCOCET/ROXICET) 5-325 MG tablet, Take 2 tablets by mouth every 4 (four) hours as needed for severe pain., Disp: 15 tablet, Rfl: 0 .  potassium chloride SA (K-DUR,KLOR-CON) 20 MEQ tablet, Take 1 tablet (20 mEq total) by mouth daily. Take an additional 20 meq (1 tab) on Thursday with metolazone, Disp: 35 tablet, Rfl: 6 .  spironolactone (ALDACTONE) 25 MG tablet, Take 1 tablet (25 mg total) by mouth daily., Disp: 90 tablet, Rfl: 3 .  SYMBICORT 160-4.5 MCG/ACT inhaler, INHALE 2 PUFFS INTO THE LUNGS TWICE DAILY, Disp: 1 Inhaler, Rfl: 11 .  torsemide (DEMADEX) 20 MG tablet, Take 2 tablets (40 mg total) by mouth 2 (two) times daily., Disp: 360 tablet, Rfl: 3 .  warfarin (COUMADIN) 5 MG tablet, TAKE 1 TABLET TO 1 AND 1/2 TABLETS BY MOUTH EVERY DAY AS DIRECTED BY COUMADIN CLINIC (Patient taking differently: Take 1 and 1/2 tablets (7.83m) on Mondays and Fridays. Take 1 tablet (548m once daily on every other day), Disp: 45 tablet, Rfl: 2 .  amoxicillin-clavulanate (AUGMENTIN) 875-125 MG tablet, Take 1 tablet by mouth 2 (two) times daily. for 10 days, Disp: , Rfl: 0 .  nitroGLYCERIN (NITROSTAT) 0.4 MG SL tablet, Place 1 tablet (0.4 mg total) under the tongue every 5 (five) minutes as needed for chest pain. (Patient not taking: Reported on 03/16/2017), Disp: 25 tablet, Rfl: 3 Allergies  Allergen Reactions  . Gabapentin Nausea And Vomiting    POSSIBLE SHAKING? TAKING MED CURRENTLY     Social History   Social History  . Marital status: Single    Spouse name: N/A  . Number of children: N/A  . Years of education: N/A   Occupational History  . disabled    Social History Main Topics  . Smoking status: Current Some Day Smoker    Packs/day: 0.00    Years: 40.00    Types: E-cigarettes    Last attempt to quit: 11/03/2015  . Smokeless tobacco: Former [employer] developer   Types: Snuff  . Alcohol use No     Comment: former alcohol abuse  . Drug use: No     Comment: 04/29/2015 "last drug use was ~ 09/08/2013"  . Sexual activity: No   Other Topics Concern  . Not on file   Social History Narrative   [place]  They are looking to move but don't have a place to go yet.      Physical Exam      Future Appointments Date Time Provider [place]5/02/2017 1:00 PM [facility]  03/21/2017 12:30 PM [facility]5/30/2018 8:00 AM [facility]   BP 134/72   Pulse 100  Resp 16   Wt 260 lb 12.8 oz (118.3 kg)   LMP 11/27/2012   SpO2 96%   BMI 44.77 kg/m  Weight yesterday-didn't weigh  Last visit weight-264 CBG PTA-251 [facility] EMS-270  Pt went to ER yesterday for leg pain and her legs were weeping-she has someone coming out to home [place] to wrap her legs, she goes to wound clinic next week. She ate arbys this morning, she c/o nausea she had taken her antibiotic and a pain pill that was given to her last night from ER visit. She did her pill box-when she was placing the lids back on it one of the pills was in the crease and had busted up and at that point her sister had misplaced 2 of her pills but other than that there was no errors. Her diet is very poor. She has picked up smoking again. But states she doesn't do it much. She has not weighed in a few days.  She has a very slight wheeze to the left lower side. Encouraged her to better her diet, she was using other type of salts however they were still high in sodium.  When her sister and her friend is there it is difficult to talk to pt as they talk over the pt and talk over me when I make recommendations of foods and what she should avoid and what to eat.   ACTION: Home visit completed Next visit planned for next week   Marylouise Stacks, EMT-Paramedic 03/16/17

## 2017-03-17 ENCOUNTER — Ambulatory Visit (INDEPENDENT_AMBULATORY_CARE_PROVIDER_SITE_OTHER): Payer: Medicare Other | Admitting: Pharmacist

## 2017-03-17 DIAGNOSIS — Z5181 Encounter for therapeutic drug level monitoring: Secondary | ICD-10-CM

## 2017-03-17 DIAGNOSIS — I4891 Unspecified atrial fibrillation: Secondary | ICD-10-CM

## 2017-03-17 DIAGNOSIS — I480 Paroxysmal atrial fibrillation: Secondary | ICD-10-CM | POA: Diagnosis not present

## 2017-03-17 LAB — POCT INR: INR: 2.8

## 2017-03-21 ENCOUNTER — Ambulatory Visit (HOSPITAL_COMMUNITY)
Admission: RE | Admit: 2017-03-21 | Discharge: 2017-03-21 | Disposition: A | Discharge status: Home / Self Care | Payer: Medicare Other | Source: Ambulatory Visit | Attending: Surgery | Admitting: Surgery

## 2017-03-21 ENCOUNTER — Encounter (HOSPITAL_BASED_OUTPATIENT_CLINIC_OR_DEPARTMENT_OTHER): Payer: Medicare Other | Attending: Surgery

## 2017-03-21 ENCOUNTER — Other Ambulatory Visit: Payer: Self-pay | Admitting: Surgery

## 2017-03-21 DIAGNOSIS — F17210 Nicotine dependence, cigarettes, uncomplicated: Secondary | ICD-10-CM | POA: Insufficient documentation

## 2017-03-21 DIAGNOSIS — I70242 Atherosclerosis of native arteries of left leg with ulceration of calf: Secondary | ICD-10-CM | POA: Insufficient documentation

## 2017-03-21 DIAGNOSIS — E1151 Type 2 diabetes mellitus with diabetic peripheral angiopathy without gangrene: Secondary | ICD-10-CM | POA: Insufficient documentation

## 2017-03-21 DIAGNOSIS — I708 Atherosclerosis of other arteries: Secondary | ICD-10-CM | POA: Insufficient documentation

## 2017-03-21 DIAGNOSIS — X58XXXA Exposure to other specified factors, initial encounter: Secondary | ICD-10-CM | POA: Diagnosis not present

## 2017-03-21 DIAGNOSIS — L97512 Non-pressure chronic ulcer of other part of right foot with fat layer exposed: Secondary | ICD-10-CM | POA: Insufficient documentation

## 2017-03-21 DIAGNOSIS — M7989 Other specified soft tissue disorders: Secondary | ICD-10-CM | POA: Diagnosis not present

## 2017-03-21 DIAGNOSIS — L97222 Non-pressure chronic ulcer of left calf with fat layer exposed: Secondary | ICD-10-CM | POA: Diagnosis not present

## 2017-03-21 DIAGNOSIS — L97812 Non-pressure chronic ulcer of other part of right lower leg with fat layer exposed: Secondary | ICD-10-CM | POA: Insufficient documentation

## 2017-03-21 DIAGNOSIS — L97522 Non-pressure chronic ulcer of other part of left foot with fat layer exposed: Secondary | ICD-10-CM | POA: Diagnosis not present

## 2017-03-21 DIAGNOSIS — Z9119 Patient's noncompliance with other medical treatment and regimen: Secondary | ICD-10-CM | POA: Insufficient documentation

## 2017-03-21 DIAGNOSIS — M86171 Other acute osteomyelitis, right ankle and foot: Secondary | ICD-10-CM

## 2017-03-21 DIAGNOSIS — I509 Heart failure, unspecified: Secondary | ICD-10-CM | POA: Diagnosis not present

## 2017-03-21 DIAGNOSIS — G4730 Sleep apnea, unspecified: Secondary | ICD-10-CM | POA: Insufficient documentation

## 2017-03-21 DIAGNOSIS — E11621 Type 2 diabetes mellitus with foot ulcer: Secondary | ICD-10-CM | POA: Insufficient documentation

## 2017-03-21 DIAGNOSIS — J449 Chronic obstructive pulmonary disease, unspecified: Secondary | ICD-10-CM | POA: Insufficient documentation

## 2017-03-21 DIAGNOSIS — S93101A Unspecified subluxation of right toe(s), initial encounter: Secondary | ICD-10-CM | POA: Insufficient documentation

## 2017-03-21 DIAGNOSIS — E1140 Type 2 diabetes mellitus with diabetic neuropathy, unspecified: Secondary | ICD-10-CM | POA: Insufficient documentation

## 2017-03-21 DIAGNOSIS — L97822 Non-pressure chronic ulcer of other part of left lower leg with fat layer exposed: Secondary | ICD-10-CM | POA: Diagnosis not present

## 2017-03-21 DIAGNOSIS — L97514 Non-pressure chronic ulcer of other part of right foot with necrosis of bone: Secondary | ICD-10-CM | POA: Diagnosis not present

## 2017-03-21 DIAGNOSIS — I890 Lymphedema, not elsewhere classified: Secondary | ICD-10-CM | POA: Insufficient documentation

## 2017-03-21 DIAGNOSIS — I110 Hypertensive heart disease with heart failure: Secondary | ICD-10-CM | POA: Insufficient documentation

## 2017-03-21 DIAGNOSIS — L97519 Non-pressure chronic ulcer of other part of right foot with unspecified severity: Secondary | ICD-10-CM | POA: Diagnosis present

## 2017-03-21 DIAGNOSIS — M7731 Calcaneal spur, right foot: Secondary | ICD-10-CM | POA: Diagnosis not present

## 2017-03-21 DIAGNOSIS — K7460 Unspecified cirrhosis of liver: Secondary | ICD-10-CM | POA: Insufficient documentation

## 2017-03-21 DIAGNOSIS — L97212 Non-pressure chronic ulcer of right calf with fat layer exposed: Secondary | ICD-10-CM | POA: Diagnosis not present

## 2017-03-21 DIAGNOSIS — I70232 Atherosclerosis of native arteries of right leg with ulceration of calf: Secondary | ICD-10-CM | POA: Diagnosis not present

## 2017-03-23 ENCOUNTER — Other Ambulatory Visit (HOSPITAL_COMMUNITY): Payer: Self-pay

## 2017-03-23 ENCOUNTER — Other Ambulatory Visit (HOSPITAL_COMMUNITY): Payer: Self-pay | Admitting: Pharmacist

## 2017-03-23 MED ORDER — SPIRONOLACTONE 25 MG PO TABS: 25.0000 mg | ORAL_TABLET | Freq: Every day | ORAL | 3 refills | Status: DC
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
## 2017-03-28 DIAGNOSIS — E11621 Type 2 diabetes mellitus with foot ulcer: Secondary | ICD-10-CM | POA: Diagnosis not present

## 2017-03-30 ENCOUNTER — Other Ambulatory Visit (HOSPITAL_COMMUNITY): Payer: Self-pay

## 2017-03-30 NOTE — Progress Notes (Signed)
Paramedicine Encounter    Patient ID: [name], female    DOB: [birth year], 56 y.o.   MRN: [id number]   Patient Care Team: Sandi Mariscal, MD as PCP - General (Internal Medicine) [name], MD as Consulting Physician (Cardiology) [name], MD as Consulting Physician (Pulmonary Disease)  Patient Active Problem List   Diagnosis Date Noted  . CKD (chronic kidney disease), stage III 11/24/2016  . Suspected sleep apnea 11/24/2016  . Severe obesity (BMI >= 40) ([place]) 02/24/2016  . COPD GOLD 0  02/24/2016  . Chronic systolic CHF (congestive heart failure) ([place]) 02/12/2016  . Encounter for therapeutic drug monitoring 02/10/2016  . Symptomatic bradycardia 01/12/2016  . Essential hypertension 12/22/2015  . Wheeze   . 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 Tobacco abuse 10/23/2015  . Coronary artery disease involving native coronary artery of native heart   . DOE (dyspnea on exertion) 04/29/2015  . PAF (paroxysmal atrial fibrillation) ([place]) 01/16/2015  . Carotid arterial disease ([place]) 01/16/2015  . Claudication ([place]) 01/15/2015  . Demand ischemia ([place]) 10/29/2014  . Insomnia 02/03/2014  . S/P peripheral artery angioplasty - TurboHawk atherectomy; R SFA 09/11/2013    Class: Acute  . Leg pain, bilateral 08/19/2013  . Hypothyroidism 07/31/2013  . Cellulitis 06/13/2013  . History of cocaine abuse 06/13/2013  . Long term current use of anticoagulant therapy 05/20/2013  . Alcohol abuse   . Narcotic abuse   . Marijuana abuse   . Alcoholic cirrhosis ([place])   . DM (diabetes mellitus), type 2 with peripheral vascular complications ([name])     Current Outpatient Prescriptions:  .  albuterol (PROVENTIL HFA;VENTOLIN HFA) 108 (90 Base) MCG/ACT inhaler, Inhale 2 puffs into the lungs every 6 (six) hours as needed for wheezing or shortness of breath., Disp: 1 each, Rfl: 3 .  albuterol (PROVENTIL) (2.5 MG/3ML) 0.083% nebulizer solution, Take 3 mLs (2.5 mg total) by nebulization every 6 (six)  hours as needed. For wheezing., Disp: 75 mL, Rfl: 2 .  amiodarone (PACERONE) 100 MG tablet, TAKE 1 TABLET BY MOUTH ONCE DAILY, Disp: 30 tablet, Rfl: 10 .  amLODipine (NORVASC) 10 MG tablet, Take 10 mg by mouth daily., Disp: , Rfl:  .  atorvastatin (LIPITOR) 40 MG tablet, TAKE 1 TABLET BY MOUTH ONCE EVERY EVENING, Disp: 90 tablet, Rfl: 3 .  bisoprolol (ZEBETA) 5 MG tablet, TAKE 1/2 TABLET BY MOUTH EVERY DAY, Disp: 15 tablet, Rfl: 2 .  Blood Glucose Monitoring Suppl (ACCU-CHEK AVIVA PLUS) W/DEVICE KIT, 1 Device by Does not apply route 4 (four) times daily -  before meals and at bedtime., Disp: 1 kit, Rfl: 0 .  cholecalciferol (VITAMIN D) 1000 units tablet, Take 1,000 Units by mouth daily., Disp: , Rfl:  .  cyanocobalamin 1000 MCG tablet, Take 1 tablet (1,000 mcg total) by mouth daily., Disp: 30 tablet, Rfl: 10 .  folic acid (FOLVITE) 1 MG tablet, TAKE 1 TABLET BY MOUTH EVERY DAY, Disp: 90 tablet, Rfl: 1 .  gabapentin (NEURONTIN) 300 MG capsule, Take 300 mg by mouth 4 (four) times daily., Disp: , Rfl:  .  insulin aspart (NOVOLOG FLEXPEN) 100 UNIT/ML FlexPen, Inject 24-44 Units into the skin 3 (three) times daily with meals. , Disp: , Rfl:  .  Insulin Glargine (LANTUS SOLOSTAR) 100 UNIT/ML Solostar Pen, Inject 80 Units into the skin daily at 10 pm. , Disp: , Rfl:  .  Insulin Pen Needle (ULTICARE MICRO PEN NEEDLES) 32G X 4 MM MISC, 1 Syringe by Does not apply  route 4 (four) times daily - after meals and at bedtime., Disp: 1 each, Rfl: 11 .  Lancets (ACCU-CHEK SOFT TOUCH) lancets, Use as instructed, Disp: 100 each, Rfl: 12 .  levothyroxine (SYNTHROID, LEVOTHROID) 50 MCG tablet, Take 1 tablet (50 mcg total) by mouth daily., Disp: 30 tablet, Rfl: 2 .  losartan (COZAAR) 25 MG tablet, Take 1 tablet (25 mg total) by mouth daily., Disp: 21 tablet, Rfl: 0 .  metolazone (ZAROXOLYN) 2.5 MG tablet, TAKE 1 TABLET BY MOUTH ONCE A WEEK ON THURSDAY. MAY TAKE AN ADDITIONAL TABLET AS DIRECTED BY HF CLINIC., Disp: 5  tablet, Rfl: 4 .  Multiple Vitamin (MULTIVITAMIN) tablet, Take 1 tablet by mouth daily., Disp: , Rfl:  .  oxyCODONE-acetaminophen (PERCOCET/ROXICET) 5-325 MG tablet, Take 2 tablets by mouth every 4 (four) hours as needed for severe pain., Disp: 15 tablet, Rfl: 0 .  potassium chloride SA (K-DUR,KLOR-CON) 20 MEQ tablet, Take 1 tablet (20 mEq total) by mouth daily. Take an additional 20 meq (1 tab) on Thursday with metolazone, Disp: 35 tablet, Rfl: 6 .  spironolactone (ALDACTONE) 25 MG tablet, Take 1 tablet (25 mg total) by mouth daily., Disp: 90 tablet, Rfl: 3 .  SYMBICORT 160-4.5 MCG/ACT inhaler, INHALE 2 PUFFS INTO THE LUNGS TWICE DAILY, Disp: 1 Inhaler, Rfl: 11 .  torsemide (DEMADEX) 20 MG tablet, Take 2 tablets (40 mg total) by mouth 2 (two) times daily., Disp: 360 tablet, Rfl: 3 .  warfarin (COUMADIN) 5 MG tablet, TAKE 1 TABLET TO 1 AND 1/2 TABLETS BY MOUTH EVERY DAY AS DIRECTED BY COUMADIN CLINIC (Patient taking differently: Take 1 and 1/2 tablets (7.51m) on Mondays and Fridays. Take 1 tablet (545m once daily on every other day), Disp: 45 tablet, Rfl: 2 .  amoxicillin-clavulanate (AUGMENTIN) 875-125 MG tablet, Take 1 tablet by mouth 2 (two) times daily. for 10 days, Disp: , Rfl: 0 .  ciprofloxacin (CIPRO) 500 MG tablet, Take 1 tablet (500 mg total) by mouth every 12 (twelve) hours. (Patient not taking: Reported on 03/23/2017), Disp: 10 tablet, Rfl: 0 .  nitroGLYCERIN (NITROSTAT) 0.4 MG SL tablet, Place 1 tablet (0.4 mg total) under the tongue every 5 (five) minutes as needed for chest pain. (Patient not taking: Reported on 03/16/2017), Disp: 25 tablet, Rfl: 3 Allergies  Allergen Reactions  . Gabapentin Nausea And Vomiting    POSSIBLE SHAKING? TAKING MED CURRENTLY     Social History   Social History  . Marital status: Single    Spouse name: N/A  . Number of children: N/A  . Years of education: N/A   Occupational History  . disabled    Social History Main Topics  . Smoking status:  Current Some Day Smoker    Packs/day: 0.00    Years: 40.00    Types: E-cigarettes    Last attempt to quit: 11/03/2015  . Smokeless tobacco: Former [employer] developer  Types: Snuff  . Alcohol use No     Comment: former alcohol abuse  . Drug use: No     Comment: 04/29/2015 "last drug use was ~ 09/08/2013"  . Sexual activity: No   Other Topics Concern  . Not on file   Social History Narrative   [place]  They are looking to move but don't have a place to go yet.      Physical Exam  Constitutional: She is oriented to person, place, and time.  Pulmonary/Chest: Effort normal and breath sounds normal.  Abdominal: Soft.  Musculoskeletal:  Normal range of motion.  Neurological: She is oriented to person, place, and time.  Skin: Skin is warm and dry.        Future Appointments Date Time Provider [place]  04/12/2017 8:00 AM [facility] V[facility] 2 [facility]  04/14/2017 1:15 PM [facility]   BP 122/70   Pulse 80   Resp 16   Wt 256 lb (116.1 kg)   LMP 11/27/2012   SpO2 96%   BMI 43.94 kg/m  Weight yesterday-?? Last visit weight-257 CBG PTA at am-148 CBG lunch-345 CBG EMS-209  Pt got her meds from [facility] without issues. Pt has a lot of leaking from her legs, she ran out of bandages, pt reports her nurse from wound clinic will be coming tomor to re-wrap her legs. She filled her own pill box this week, there was an extra amio and extra torsemide noted but other than that she did well. She is now taking pain pill again and gabapentin. Although it gave her the shakes real bad she states she would try them again and see if they help her with the pain.  I feel like she is doing well enough to back her down for 2wks- she is doing well with her pills. She states the extra pill just fell out of her hand and she didn't realize it. She is being handled by the wound clinic for her legs. They are weeping still and still  wrapped. She is sending in application for [employer] transportation and is going to try that.     ACTION: Home visit completed Next visit planned for 2 wks   Marylouise Stacks, EMT-Paramedic 03/30/17

## 2017-04-06 ENCOUNTER — Emergency Department (HOSPITAL_COMMUNITY): Payer: Medicare Other

## 2017-04-06 ENCOUNTER — Encounter (HOSPITAL_COMMUNITY): Payer: Self-pay | Admitting: Emergency Medicine

## 2017-04-06 ENCOUNTER — Inpatient Hospital Stay (HOSPITAL_COMMUNITY)
Admission: EM | Admit: 2017-04-06 | Discharge: 2017-04-14 | DRG: 853 | Disposition: A | Discharge status: Home Health | Payer: Medicare Other | Attending: Internal Medicine | Admitting: Internal Medicine

## 2017-04-06 DIAGNOSIS — R6510 Systemic inflammatory response syndrome (SIRS) of non-infectious origin without acute organ dysfunction: Secondary | ICD-10-CM | POA: Diagnosis present

## 2017-04-06 DIAGNOSIS — N179 Acute kidney failure, unspecified: Secondary | ICD-10-CM | POA: Diagnosis present

## 2017-04-06 DIAGNOSIS — J9601 Acute respiratory failure with hypoxia: Secondary | ICD-10-CM | POA: Diagnosis present

## 2017-04-06 DIAGNOSIS — L03116 Cellulitis of left lower limb: Secondary | ICD-10-CM | POA: Diagnosis present

## 2017-04-06 DIAGNOSIS — I130 Hypertensive heart and chronic kidney disease with heart failure and stage 1 through stage 4 chronic kidney disease, or unspecified chronic kidney disease: Secondary | ICD-10-CM | POA: Diagnosis present

## 2017-04-06 DIAGNOSIS — E1152 Type 2 diabetes mellitus with diabetic peripheral angiopathy with gangrene: Secondary | ICD-10-CM | POA: Diagnosis present

## 2017-04-06 DIAGNOSIS — I998 Other disorder of circulatory system: Secondary | ICD-10-CM | POA: Diagnosis not present

## 2017-04-06 DIAGNOSIS — T4275XA Adverse effect of unspecified antiepileptic and sedative-hypnotic drugs, initial encounter: Secondary | ICD-10-CM | POA: Diagnosis not present

## 2017-04-06 DIAGNOSIS — E785 Hyperlipidemia, unspecified: Secondary | ICD-10-CM | POA: Diagnosis present

## 2017-04-06 DIAGNOSIS — Z888 Allergy status to other drugs, medicaments and biological substances status: Secondary | ICD-10-CM

## 2017-04-06 DIAGNOSIS — I2510 Atherosclerotic heart disease of native coronary artery without angina pectoris: Secondary | ICD-10-CM | POA: Diagnosis present

## 2017-04-06 DIAGNOSIS — D6832 Hemorrhagic disorder due to extrinsic circulating anticoagulants: Secondary | ICD-10-CM | POA: Diagnosis present

## 2017-04-06 DIAGNOSIS — I96 Gangrene, not elsewhere classified: Secondary | ICD-10-CM | POA: Diagnosis not present

## 2017-04-06 DIAGNOSIS — Z9861 Coronary angioplasty status: Secondary | ICD-10-CM | POA: Diagnosis not present

## 2017-04-06 DIAGNOSIS — I739 Peripheral vascular disease, unspecified: Secondary | ICD-10-CM

## 2017-04-06 DIAGNOSIS — E039 Hypothyroidism, unspecified: Secondary | ICD-10-CM | POA: Diagnosis present

## 2017-04-06 DIAGNOSIS — I70238 Atherosclerosis of native arteries of right leg with ulceration of other part of lower right leg: Secondary | ICD-10-CM | POA: Diagnosis not present

## 2017-04-06 DIAGNOSIS — L0390 Cellulitis, unspecified: Secondary | ICD-10-CM

## 2017-04-06 DIAGNOSIS — E1121 Type 2 diabetes mellitus with diabetic nephropathy: Secondary | ICD-10-CM | POA: Diagnosis present

## 2017-04-06 DIAGNOSIS — B9620 Unspecified Escherichia coli [E. coli] as the cause of diseases classified elsewhere: Secondary | ICD-10-CM | POA: Diagnosis present

## 2017-04-06 DIAGNOSIS — Z7901 Long term (current) use of anticoagulants: Secondary | ICD-10-CM | POA: Diagnosis not present

## 2017-04-06 DIAGNOSIS — E1122 Type 2 diabetes mellitus with diabetic chronic kidney disease: Secondary | ICD-10-CM | POA: Diagnosis present

## 2017-04-06 DIAGNOSIS — T45515A Adverse effect of anticoagulants, initial encounter: Secondary | ICD-10-CM | POA: Diagnosis present

## 2017-04-06 DIAGNOSIS — F17290 Nicotine dependence, other tobacco product, uncomplicated: Secondary | ICD-10-CM | POA: Diagnosis present

## 2017-04-06 DIAGNOSIS — Z6841 Body Mass Index (BMI) 40.0 and over, adult: Secondary | ICD-10-CM | POA: Diagnosis not present

## 2017-04-06 DIAGNOSIS — J9600 Acute respiratory failure, unspecified whether with hypoxia or hypercapnia: Secondary | ICD-10-CM

## 2017-04-06 DIAGNOSIS — I70269 Atherosclerosis of native arteries of extremities with gangrene, unspecified extremity: Secondary | ICD-10-CM | POA: Diagnosis not present

## 2017-04-06 DIAGNOSIS — G9341 Metabolic encephalopathy: Secondary | ICD-10-CM | POA: Diagnosis present

## 2017-04-06 DIAGNOSIS — I872 Venous insufficiency (chronic) (peripheral): Secondary | ICD-10-CM | POA: Diagnosis present

## 2017-04-06 DIAGNOSIS — I429 Cardiomyopathy, unspecified: Secondary | ICD-10-CM | POA: Diagnosis present

## 2017-04-06 DIAGNOSIS — N390 Urinary tract infection, site not specified: Secondary | ICD-10-CM | POA: Diagnosis present

## 2017-04-06 DIAGNOSIS — Z794 Long term (current) use of insulin: Secondary | ICD-10-CM | POA: Diagnosis not present

## 2017-04-06 DIAGNOSIS — I480 Paroxysmal atrial fibrillation: Secondary | ICD-10-CM | POA: Diagnosis present

## 2017-04-06 DIAGNOSIS — A419 Sepsis, unspecified organism: Secondary | ICD-10-CM | POA: Diagnosis present

## 2017-04-06 DIAGNOSIS — E1165 Type 2 diabetes mellitus with hyperglycemia: Secondary | ICD-10-CM | POA: Diagnosis present

## 2017-04-06 DIAGNOSIS — R6520 Severe sepsis without septic shock: Secondary | ICD-10-CM | POA: Diagnosis present

## 2017-04-06 DIAGNOSIS — G4733 Obstructive sleep apnea (adult) (pediatric): Secondary | ICD-10-CM | POA: Diagnosis present

## 2017-04-06 DIAGNOSIS — Z7952 Long term (current) use of systemic steroids: Secondary | ICD-10-CM

## 2017-04-06 DIAGNOSIS — J449 Chronic obstructive pulmonary disease, unspecified: Secondary | ICD-10-CM | POA: Diagnosis present

## 2017-04-06 DIAGNOSIS — N183 Chronic kidney disease, stage 3 (moderate): Secondary | ICD-10-CM | POA: Diagnosis present

## 2017-04-06 DIAGNOSIS — E876 Hypokalemia: Secondary | ICD-10-CM | POA: Diagnosis not present

## 2017-04-06 DIAGNOSIS — Z79899 Other long term (current) drug therapy: Secondary | ICD-10-CM

## 2017-04-06 DIAGNOSIS — I5042 Chronic combined systolic (congestive) and diastolic (congestive) heart failure: Secondary | ICD-10-CM | POA: Diagnosis present

## 2017-04-06 DIAGNOSIS — E1142 Type 2 diabetes mellitus with diabetic polyneuropathy: Secondary | ICD-10-CM | POA: Diagnosis present

## 2017-04-06 DIAGNOSIS — I952 Hypotension due to drugs: Secondary | ICD-10-CM | POA: Diagnosis not present

## 2017-04-06 DIAGNOSIS — L03115 Cellulitis of right lower limb: Secondary | ICD-10-CM

## 2017-04-06 DIAGNOSIS — E11621 Type 2 diabetes mellitus with foot ulcer: Secondary | ICD-10-CM | POA: Diagnosis present

## 2017-04-06 DIAGNOSIS — L97519 Non-pressure chronic ulcer of other part of right foot with unspecified severity: Secondary | ICD-10-CM | POA: Diagnosis present

## 2017-04-06 DIAGNOSIS — D6489 Other specified anemias: Secondary | ICD-10-CM | POA: Diagnosis present

## 2017-04-06 DIAGNOSIS — K7030 Alcoholic cirrhosis of liver without ascites: Secondary | ICD-10-CM | POA: Diagnosis present

## 2017-04-06 DIAGNOSIS — K219 Gastro-esophageal reflux disease without esophagitis: Secondary | ICD-10-CM | POA: Diagnosis present

## 2017-04-06 HISTORY — DX: Systemic inflammatory response syndrome (sirs) of non-infectious origin without acute organ dysfunction: R65.10

## 2017-04-06 LAB — URINALYSIS, ROUTINE W REFLEX MICROSCOPIC
Bilirubin Urine: NEGATIVE
Ketones, ur: NEGATIVE mg/dL
Leukocytes, UA: NEGATIVE
Nitrite: POSITIVE — AB
PH: 5 (ref 5.0–8.0)
PROTEIN: NEGATIVE mg/dL
Specific Gravity, Urine: 1.006 (ref 1.005–1.030)

## 2017-04-06 LAB — COMPREHENSIVE METABOLIC PANEL
ALBUMIN: 3.5 g/dL (ref 3.5–5.0)
ALT: 33 U/L (ref 14–54)
AST: 29 U/L (ref 15–41)
Alkaline Phosphatase: 161 U/L — ABNORMAL HIGH (ref 38–126)
Anion gap: 14 (ref 5–15)
BUN: 89 mg/dL — AB (ref 6–20)
CHLORIDE: 99 mmol/L — AB (ref 101–111)
CO2: 23 mmol/L (ref 22–32)
CREATININE: 1.99 mg/dL — AB (ref 0.44–1.00)
Calcium: 9.7 mg/dL (ref 8.9–10.3)
GFR calc Af Amer: 32 mL/min — ABNORMAL LOW (ref >60)
GFR calc non Af Amer: 27 mL/min — ABNORMAL LOW (ref >60)
Glucose, Bld: 284 mg/dL — ABNORMAL HIGH (ref 65–99)
POTASSIUM: 4.4 mmol/L (ref 3.5–5.1)
SODIUM: 136 mmol/L (ref 135–145)
Total Bilirubin: 0.5 mg/dL (ref 0.3–1.2)
Total Protein: 8.4 g/dL — ABNORMAL HIGH (ref 6.5–8.1)

## 2017-04-06 LAB — CBC WITH DIFFERENTIAL/PLATELET
Basophils Absolute: 0 10*3/uL (ref 0.0–0.1)
Basophils Relative: 0 %
EOS ABS: 0 10*3/uL (ref 0.0–0.7)
Eosinophils Relative: 0 %
HCT: 35.7 % — ABNORMAL LOW (ref 36.0–46.0)
Hemoglobin: 11.7 g/dL — ABNORMAL LOW (ref 12.0–15.0)
Lymphocytes Relative: 4 %
Lymphs Abs: 1.4 10*3/uL (ref 0.7–4.0)
MCH: 30.2 pg (ref 26.0–34.0)
MCHC: 32.8 g/dL (ref 30.0–36.0)
MCV: 92.2 fL (ref 78.0–100.0)
MONO ABS: 0.7 10*3/uL (ref 0.1–1.0)
Monocytes Relative: 2 %
NEUTROS PCT: 94 %
Neutro Abs: 32.8 10*3/uL — ABNORMAL HIGH (ref 1.7–7.7)
PLATELETS: 373 10*3/uL (ref 150–400)
RBC: 3.87 MIL/uL (ref 3.87–5.11)
RDW: 15.7 % — ABNORMAL HIGH (ref 11.5–15.5)
WBC: 34.9 10*3/uL — AB (ref 4.0–10.5)

## 2017-04-06 LAB — ETHANOL: Alcohol, Ethyl (B): <5 mg/dL (ref <5)

## 2017-04-06 LAB — PROTIME-INR
INR: 2.24
PROTHROMBIN TIME: 25.2 s — AB (ref 11.4–15.2)

## 2017-04-06 LAB — I-STAT ARTERIAL BLOOD GAS, ED
Acid-Base Excess: 2 mmol/L (ref 0.0–2.0)
Bicarbonate: 27.1 mmol/L (ref 20.0–28.0)
O2 SAT: 98 %
PCO2 ART: 45.6 mmHg (ref 32.0–48.0)
PH ART: 7.382 (ref 7.350–7.450)
Patient temperature: 98.6
TCO2: 28 mmol/L (ref 0–100)
pO2, Arterial: 116 mmHg — ABNORMAL HIGH (ref 83.0–108.0)

## 2017-04-06 LAB — GLUCOSE, CAPILLARY: Glucose-Capillary: 301 mg/dL — ABNORMAL HIGH (ref 65–99)

## 2017-04-06 LAB — TRIGLYCERIDES: TRIGLYCERIDES: 226 mg/dL — AB (ref <150)

## 2017-04-06 LAB — ACETAMINOPHEN LEVEL

## 2017-04-06 LAB — I-STAT CG4 LACTIC ACID, ED
LACTIC ACID, VENOUS: 2.8 mmol/L — AB (ref 0.5–1.9)
Lactic Acid, Venous: 0.89 mmol/L (ref 0.5–1.9)

## 2017-04-06 LAB — BRAIN NATRIURETIC PEPTIDE: B NATRIURETIC PEPTIDE 5: 240.1 pg/mL — AB (ref 0.0–100.0)

## 2017-04-06 MED ORDER — PIPERACILLIN-TAZOBACTAM 3.375 G IVPB 30 MIN
3.3750 g | Freq: Once | INTRAVENOUS | Status: AC
  Administered 2017-04-06: 3.375 g via INTRAVENOUS
  Filled 2017-04-06: qty 50

## 2017-04-06 MED ORDER — BUDESONIDE 0.5 MG/2ML IN SUSP
0.5000 mg | Freq: Two times a day (BID) | RESPIRATORY_TRACT | Status: DC
  Administered 2017-04-06 – 2017-04-14 (×15): 0.5 mg via RESPIRATORY_TRACT
  Filled 2017-04-06 (×16): qty 2

## 2017-04-06 MED ORDER — PIPERACILLIN-TAZOBACTAM 3.375 G IVPB
3.3750 g | Freq: Three times a day (TID) | INTRAVENOUS | Status: DC
  Administered 2017-04-07 – 2017-04-09 (×8): 3.375 g via INTRAVENOUS
  Filled 2017-04-06 (×10): qty 50

## 2017-04-06 MED ORDER — PROPOFOL 1000 MG/100ML IV EMUL
0.0000 ug/kg/min | INTRAVENOUS | Status: DC
  Administered 2017-04-06: 5 ug/kg/min via INTRAVENOUS
  Administered 2017-04-06: 45 ug/kg/min via INTRAVENOUS
  Administered 2017-04-07: 30 ug/kg/min via INTRAVENOUS
  Filled 2017-04-06 (×3): qty 100

## 2017-04-06 MED ORDER — PANTOPRAZOLE SODIUM 40 MG IV SOLR: 40.0000 mg | Freq: Every day | INTRAVENOUS | Status: DC

## 2017-04-06 MED ORDER — HEPARIN SODIUM (PORCINE) 5000 UNIT/ML IJ SOLN
5000.0000 [IU] | Freq: Three times a day (TID) | INTRAMUSCULAR | Status: DC
  Filled 2017-04-06: qty 1

## 2017-04-06 MED ORDER — SODIUM CHLORIDE 0.9 % IV SOLN
INTRAVENOUS | Status: DC
  Administered 2017-04-06: 18:00:00 via INTRAVENOUS

## 2017-04-06 MED ORDER — SODIUM CHLORIDE 0.9 % IV SOLN
0.4000 ug/kg/h | INTRAVENOUS | Status: DC
  Filled 2017-04-06: qty 2

## 2017-04-06 MED ORDER — FENTANYL CITRATE (PF) 100 MCG/2ML IJ SOLN: 25.0000 ug | INTRAMUSCULAR | Status: DC | PRN

## 2017-04-06 MED ORDER — IPRATROPIUM-ALBUTEROL 0.5-2.5 (3) MG/3ML IN SOLN
3.0000 mL | Freq: Four times a day (QID) | RESPIRATORY_TRACT | Status: DC
  Administered 2017-04-06 – 2017-04-07 (×5): 3 mL via RESPIRATORY_TRACT
  Filled 2017-04-06 (×4): qty 3

## 2017-04-06 MED ORDER — INSULIN ASPART 100 UNIT/ML ~~LOC~~ SOLN: 2.0000 [IU] | SUBCUTANEOUS | Status: DC

## 2017-04-06 MED ORDER — INSULIN ASPART 100 UNIT/ML ~~LOC~~ SOLN
0.0000 [IU] | SUBCUTANEOUS | Status: DC
  Administered 2017-04-07: 15 [IU] via SUBCUTANEOUS
  Administered 2017-04-07: 20 [IU] via SUBCUTANEOUS

## 2017-04-06 MED ORDER — ETOMIDATE 2 MG/ML IV SOLN
20.0000 mg | Freq: Once | INTRAVENOUS | Status: AC
  Administered 2017-04-06: 20 mg via INTRAVENOUS

## 2017-04-06 MED ORDER — VANCOMYCIN HCL IN DEXTROSE 1-5 GM/200ML-% IV SOLN
1000.0000 mg | Freq: Once | INTRAVENOUS | Status: AC
  Administered 2017-04-06: 1000 mg via INTRAVENOUS
  Filled 2017-04-06: qty 200

## 2017-04-06 MED ORDER — SODIUM CHLORIDE 0.9 % IV SOLN: 250.0000 mL | INTRAVENOUS | Status: DC | PRN

## 2017-04-06 MED ORDER — ACETAMINOPHEN 650 MG RE SUPP
650.0000 mg | Freq: Once | RECTAL | Status: AC
  Administered 2017-04-06: 650 mg via RECTAL

## 2017-04-06 MED ORDER — SUCCINYLCHOLINE CHLORIDE 20 MG/ML IJ SOLN
100.0000 mg | Freq: Once | INTRAMUSCULAR | Status: AC
  Administered 2017-04-06: 100 mg via INTRAVENOUS

## 2017-04-06 MED ORDER — PROPOFOL 10 MG/ML IV BOLUS
INTRAVENOUS | Status: AC
  Filled 2017-04-06: qty 20

## 2017-04-06 MED ORDER — VANCOMYCIN HCL 10 G IV SOLR
1250.0000 mg | INTRAVENOUS | Status: DC
  Administered 2017-04-07 – 2017-04-08 (×2): 1250 mg via INTRAVENOUS
  Filled 2017-04-06 (×3): qty 1250

## 2017-04-06 NOTE — ED Notes (Signed)
Report attempted x 1

## 2017-04-06 NOTE — Progress Notes (Signed)
ANTICOAGULATION CONSULT NOTE - Initial Consult  Pharmacy Consult for heparin Indication: atrial fibrillation  Allergies  Allergen Reactions  . Gabapentin Nausea And Vomiting    POSSIBLE SHAKING? TAKING MED CURRENTLY    Patient Measurements: Height: 5\' 3"  (160 cm) Weight: 256 lb (116.1 kg) IBW/kg (Calculated) : 52.4 Heparin Dosing Weight: 80.7kg  Vital Signs: Temp: 103.8 F (39.9 C) (05/24 1846) Temp Source: Rectal (05/24 1846) BP: 99/44 (05/24 2140) Pulse Rate: 83 (05/24 2140)  Labs:  Recent Labs  04/06/17 1650  HGB 11.7*  HCT 35.7*  PLT 373  LABPROT 25.2*  INR 2.24  CREATININE 1.99*    Estimated Creatinine Clearance: 39.7 mL/min (A) (by C-G formula based on SCr of 1.99 mg/dL (H)).   Medical History: Past Medical History:  Diagnosis Date  . Alcohol abuse   . Alcoholic cirrhosis ([place])   . Alcoholic cirrhosis ([place])   . Anemia   . Anxiety   . B12 deficiency   . CAD (coronary artery disease)    a. cath 11/10/2014 LM nl, LAD min irregs, D1 30 ost, D2 50d, LCX 17m, OM1 80 p/m (1.5 mm vessel), OM2 18m, RCA nondom 42m-->med rx.. Demand ischemia in the setting of rapid a-fib.  . Cardiomyopathy ([place])   . Carotid artery disease ([place])    a. 01/2015 Carotid Angio: RICA 130, LICA 86V. s/p L carotid endarterectomy 02/2015.  [name] Chronic combined systolic and diastolic CHF (congestive heart failure) ([place])    a. Last echo 12/205: EF 40-45%, inferoapical/posterior HK, not technically sufficient to allow eval of LV diastolic function, normal LA in size..  . Cocaine abuse   . COPD (chronic obstructive pulmonary disease) ([place])   . Critical lower limb ischemia   . Diabetic peripheral neuropathy ([place])   . Elevated troponin    a. Chronic elevation.  [name] GERD (gastroesophageal reflux disease)   . Hyperlipemia   . Hypertension   . Hypokalemia   . Hypomagnesemia   . Hypothyroidism   . Marijuana abuse   . Narcotic abuse   . Noncompliance   . NSVT (nonsustained ventricular  tachycardia) ([place])   . Obesity   . PAF (paroxysmal atrial fibrillation) ([place])   . Paroxysmal atrial tachycardia ([place])   . Paroxysmal atrial tachycardia ([place])   . Peripheral arterial disease ([place])    a. 01/2015 Angio/PTA: LSFA 100 w/ recon @ adductor canal and 1 vessel runoff via AT, RSFA 99 (atherectomy/pta) - 1 vessel runoff via diff dzs peroneal.  . Poorly controlled type 2 diabetes mellitus ([place])   . Renal insufficiency    a. 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  [name] Symptomatic bradycardia    avoid AV blocking agent per EP  . Tobacco abuse     Assessment: 56yo F on PTA warfarin for afib. Pharmacy consulted to dose heparin while warfarin on hold. INR on admission therapeutic at 2.24  Goal of Therapy:  Heparin level 0.3-0.7 units/ml Monitor platelets by anticoagulation protocol: Yes   Plan:  Start heparin 1300 units/hr when INR < 2   Gwenlyn Perking, PharmD PGY1 Pharmacy Resident Rx ED [phone] 04/06/2017 10:26 PM

## 2017-04-06 NOTE — ED Notes (Signed)
Tube inserted. Color change noted.

## 2017-04-06 NOTE — Progress Notes (Signed)
[facility] Progress Note Patient Name: [name] DOB: [birth year] MRN: [id number]   Date of Service  04/06/2017  HPI/Events of Note  56 yo WF with severe sepsis and resp failure Source of infected foot, h/o DM   eICU Interventions  1.vent support 2.sepsis protocol 3.iv abx  Already staffed     Intervention Category Evaluation Type: New Patient Evaluation  Flora Lipps 04/06/2017, 11:29 PM

## 2017-04-06 NOTE — ED Triage Notes (Signed)
Per EMS pt was found at home from family member very sluggish and not responding to them last night and more today. Per Ems pt's family stated sghe is always alert orientedx4. She was given a medication acetamintophen-cod and there are 8 pills missing more than should be. Family feels like pt has overdosed on medication. 142/78, hr 94, 95%, CBG 447. Ems had given a bolus and only had gotten 200.

## 2017-04-06 NOTE — Progress Notes (Addendum)
Pharmacy Antibiotic Note  [name] is a 56 y.o. female admitted on 04/06/2017 with cellulitis and UTI.  Pharmacy has been consulted for vancomycin and Zosyn dosing. Tmax 103.8, WBC 34.9, normalized CrCl ~36 ml/min  Received vanc 1g in ED  Plan: Vancomycin 1250 mg IV Q24H Zosyn 3.375g IV q8h (4 hour infusion).  Monitor renal function, cultures, vanc trough at steady state   Height: 5\' 3"  (160 cm) Weight: 256 lb (116.1 kg) IBW/kg (Calculated) : 52.4  Temp (24hrs), Avg:101.8 F (38.8 C), Min:99.7 F (37.6 C), Max:103.8 F (39.9 C)   Recent Labs Lab 04/06/17 1650 04/06/17 1704 04/06/17 2018  WBC 34.9*  --   --   CREATININE 1.99*  --   --   LATICACIDVEN  --  2.80* 0.89    Estimated Creatinine Clearance: 39.7 mL/min (A) (by C-G formula based on SCr of 1.99 mg/dL (H)).    Allergies  Allergen Reactions  . Gabapentin Nausea And Vomiting    POSSIBLE SHAKING? TAKING MED CURRENTLY    Antimicrobials this admission: 5/24 vanc>> 5/24 Zosyn >>  Microbiology results: 5/24 BCx:  5/24 UCx:   Thank you for allowing pharmacy to be a part of this patient's care.  Shelda Altes 04/06/2017 10:35 PM

## 2017-04-06 NOTE — H&P (Signed)
PULMONARY / CRITICAL CARE MEDICINE   Name: [name] MRN: [id number] DOB: 1961-04-27    ADMISSION DATE:  04/06/2017 CONSULTATION DATE:  04/06/2017  REFERRING MD:  Dr. Vanita Panda EDP  CHIEF COMPLAINT:  AMS  HISTORY OF PRESENT ILLNESS:   56 year old female with past medical history as below, which is significant for paroxysmal atrial fibrillation, diabetes mellitus, alcohol cirrhosis, systolic and diastolic congestive heart failure, polysubstance abuse, COPD, and coronary artery disease. The patient is intubated and encephalopathic at the time of evaluation; therefore history obtained from chart review. She presented to [facility] emergency department 5/24 from home accompanied by her sister who notes altered mental status. Upon arrival to the emergency department she was minimally interactive and can only complain of pain in her lower extremities. Family did voice some concern that she increase the dose of her Percocet over the past 8 days. Her mental status continued to wane and she was eventually intubated for airway protection. Laboratory evaluation in the emergency department was significant for WBC 34.9, hemoglobin 11.7, serum creatinine 1.99, BUN 89, glucose 24, lactic acid 2.8. This cleared to 0.9 after IV fluid administration. She did experience some hypotension, but this was only after propofol was initiated. She was found to have what appeared to be diabetic wounds on both her feet as well as erythema extending up to bilateral knees. Urinalysis also concerning with any bacteria and positive nitrites. She was started on broad-spectrum antibiotics for cellulitis versus urinary tract infection. PCCM was asked to admit to the ICU.  PAST MEDICAL HISTORY :  She  has a past medical history of Alcohol abuse; Alcoholic cirrhosis ([place]); Alcoholic cirrhosis ([place]); Anemia; Anxiety; B12 deficiency; CAD (coronary artery disease); Cardiomyopathy ([place]); Carotid artery disease ([place]); Chronic combined  systolic and diastolic CHF (congestive heart failure) ([place]); Cocaine abuse; COPD (chronic obstructive pulmonary disease) ([place]); Critical lower limb ischemia; Diabetic peripheral neuropathy ([place]); Elevated troponin; GERD (gastroesophageal reflux disease); Hyperlipemia; Hypertension; Hypokalemia; Hypomagnesemia; Hypothyroidism; Marijuana abuse; Narcotic abuse; Noncompliance; NSVT (nonsustained ventricular tachycardia) ([place]); Obesity; PAF (paroxysmal atrial fibrillation) ([place]); Paroxysmal atrial tachycardia ([place]); Paroxysmal atrial tachycardia ([place]); Peripheral arterial disease ([place]); Poorly controlled type 2 diabetes mellitus ([place]); Renal insufficiency; Symptomatic bradycardia; and Tobacco abuse.  PAST SURGICAL HISTORY: She  has a past surgical history that includes Cardioversion (~ 02/2013); lower extremity angiogram (N/A, 09/10/2013); left heart catheterization with coronary angiogram (N/A, 10/31/2014); Peripheral athrectomy (Right, 01/15/2015); Balloon angioplasty, artery (Right, 01/15/2015); Cardiac catheterization; lower extremity angiogram (N/A, 01/15/2015); carotid angiogram (N/A, 01/15/2015); Endarterectomy (Left, 02/19/2015); Dilation and curettage of uterus (1988); and Cardiac catheterization (N/A, 01/12/2016).  Allergies  Allergen Reactions  . Gabapentin Nausea And Vomiting    POSSIBLE SHAKING? TAKING MED CURRENTLY    No current facility-administered medications on file prior to encounter.    Current Outpatient Prescriptions on File Prior to Encounter  Medication Sig  . albuterol (PROVENTIL HFA;VENTOLIN HFA) 108 (90 Base) MCG/ACT inhaler Inhale 2 puffs into the lungs every 6 (six) hours as needed for wheezing or shortness of breath.  [name] albuterol (PROVENTIL) (2.5 MG/3ML) 0.083% nebulizer solution Take 3 mLs (2.5 mg total) by nebulization every 6 (six) hours as needed. For wheezing.  [name] amiodarone (PACERONE) 100 MG tablet TAKE 1 TABLET BY MOUTH ONCE DAILY  . amLODipine (NORVASC) 10 MG tablet Take 10 mg  by mouth daily.  [name] amoxicillin-clavulanate (AUGMENTIN) 875-125 MG tablet Take 1 tablet by mouth 2 (two) times daily. for 10 days  . atorvastatin (LIPITOR) 40 MG tablet TAKE 1 TABLET BY MOUTH ONCE  EVERY EVENING  . bisoprolol (ZEBETA) 5 MG tablet TAKE 1/2 TABLET BY MOUTH EVERY DAY  . Blood Glucose Monitoring Suppl (ACCU-CHEK AVIVA PLUS) W/DEVICE KIT 1 Device by Does not apply route 4 (four) times daily -  before meals and at bedtime.  . cholecalciferol (VITAMIN D) 1000 units tablet Take 1,000 Units by mouth daily.  . ciprofloxacin (CIPRO) 500 MG tablet Take 1 tablet (500 mg total) by mouth every 12 (twelve) hours. (Patient not taking: Reported on 03/23/2017)  . cyanocobalamin 1000 MCG tablet Take 1 tablet (1,000 mcg total) by mouth daily.  . folic acid (FOLVITE) 1 MG tablet TAKE 1 TABLET BY MOUTH EVERY DAY  . gabapentin (NEURONTIN) 300 MG capsule Take 300 mg by mouth 4 (four) times daily.  . insulin aspart (NOVOLOG FLEXPEN) 100 UNIT/ML FlexPen Inject 24-44 Units into the skin 3 (three) times daily with meals.   . Insulin Glargine (LANTUS SOLOSTAR) 100 UNIT/ML Solostar Pen Inject 80 Units into the skin daily at 10 pm.   . Insulin Pen Needle (ULTICARE MICRO PEN NEEDLES) 32G X 4 MM MISC 1 Syringe by Does not apply route 4 (four) times daily - after meals and at bedtime.  . Lancets (ACCU-CHEK SOFT TOUCH) lancets Use as instructed  . levothyroxine (SYNTHROID, LEVOTHROID) 50 MCG tablet Take 1 tablet (50 mcg total) by mouth daily.  [name] losartan (COZAAR) 25 MG tablet Take 1 tablet (25 mg total) by mouth daily.  . metolazone (ZAROXOLYN) 2.5 MG tablet TAKE 1 TABLET BY MOUTH ONCE A WEEK ON THURSDAY. MAY TAKE AN ADDITIONAL TABLET AS DIRECTED BY HF CLINIC.  . Multiple Vitamin (MULTIVITAMIN) tablet Take 1 tablet by mouth daily.  . nitroGLYCERIN (NITROSTAT) 0.4 MG SL tablet Place 1 tablet (0.4 mg total) under the tongue every 5 (five) minutes as needed for chest pain. (Patient not taking: Reported on 03/16/2017)  .  oxyCODONE-acetaminophen (PERCOCET/ROXICET) 5-325 MG tablet Take 2 tablets by mouth every 4 (four) hours as needed for severe pain.  . potassium chloride SA (K-DUR,KLOR-CON) 20 MEQ tablet Take 1 tablet (20 mEq total) by mouth daily. Take an additional 20 meq (1 tab) on Thursday with metolazone  . spironolactone (ALDACTONE) 25 MG tablet Take 1 tablet (25 mg total) by mouth daily.  . SYMBICORT 160-4.5 MCG/ACT inhaler INHALE 2 PUFFS INTO THE LUNGS TWICE DAILY  . torsemide (DEMADEX) 20 MG tablet Take 2 tablets (40 mg total) by mouth 2 (two) times daily.  [name] warfarin (COUMADIN) 5 MG tablet TAKE 1 TABLET TO 1 AND 1/2 TABLETS BY MOUTH EVERY DAY AS DIRECTED BY COUMADIN CLINIC (Patient taking differently: Take 1 and 1/2 tablets (7.54m) on Mondays and Fridays. Take 1 tablet (561m once daily on every other day)    FAMILY HISTORY:  Her indicated that her mother is deceased. She indicated that her father is deceased. She indicated that the status of her sister is unknown.    SOCIAL HISTORY: She  reports that she has been smoking E-cigarettes.  She has been smoking about 0.00 packs per day for the past 40.00 years. She has quit using smokeless tobacco. Her smokeless tobacco use included Snuff. She reports that she does not drink alcohol or use drugs.  REVIEW OF SYSTEMS:   Unable   SUBJECTIVE:  unable  VITAL SIGNS: BP (!) 97/48   Pulse 85   Temp (!) 103.8 F (39.9 C) (Rectal)   Resp (!) 26   Ht '5\' 3"'  (1.6 m)   Wt 116.1 kg (256 lb)   LMP  11/27/2012   SpO2 99%   BMI 45.35 kg/m   HEMODYNAMICS:    VENTILATOR SETTINGS: Vent Mode: PRVC FiO2 (%):  [60 %] 60 % Set Rate:  [14 bmp] 14 bmp Vt Set:  [420 mL] 420 mL PEEP:  [5 cmH20] 5 cmH20  INTAKE / OUTPUT: I/O last 3 completed shifts: In: 49 [IV Piggyback:50] Out: -   PHYSICAL EXAMINATION: General:  Morbidly obese female on ventilator Neuro:  Sedated, RASS -3 HEENT:  [place]/AT, PERRL, no JVD Cardiovascular:  RRR, no MRG Lungs:  Clear bilateral  breath sounds Abdomen:  Soft, non-tender, non-distended Musculoskeletal:  No acute deformity or ROM limitation Skin:  Wounds to bilateral lower extremities, photos documented in emergency note. Erythema bilateral lower extremities.   LABS:  BMET  Recent Labs Lab 04/06/17 1650  NA 136  K 4.4  CL 99*  CO2 23  BUN 89*  CREATININE 1.99*  GLUCOSE 284*    Electrolytes  Recent Labs Lab 04/06/17 1650  CALCIUM 9.7    CBC  Recent Labs Lab 04/06/17 1650  WBC 34.9*  HGB 11.7*  HCT 35.7*  PLT 373    Coag's  Recent Labs Lab 04/06/17 1650  INR 2.24    Sepsis Markers  Recent Labs Lab 04/06/17 1704 04/06/17 2018  LATICACIDVEN 2.80* 0.89    ABG  Recent Labs Lab 04/06/17 2017  PHART 7.382  PCO2ART 45.6  PO2ART 116.0*    Liver Enzymes  Recent Labs Lab 04/06/17 1650  AST 29  ALT 33  ALKPHOS 161*  BILITOT 0.5  ALBUMIN 3.5    Cardiac Enzymes No results for input(s): TROPONINI, PROBNP in the last 168 hours.  Glucose No results for input(s): GLUCAP in the last 168 hours.  Imaging Dg Chest Portable 1 View  Result Date: 04/06/2017 CLINICAL DATA:  Endotracheal tube and orogastric tube placement. Initial encounter. EXAM: PORTABLE CHEST 1 VIEW COMPARISON:  Chest radiograph performed 04/06/2017 FINDINGS: The patient's endotracheal tube appears to end at the carina. This should be retracted 3 cm. The patient's enteric tube is seen ending overlying the body of the stomach. There is mild elevation of the left hemidiaphragm. Mild vascular congestion is noted. Mild bibasilar atelectasis seen. No pleural effusion or pneumothorax is seen. The cardiomediastinal silhouette is mildly enlarged. No acute osseous abnormalities are identified. IMPRESSION: 1. Endotracheal tube appears to end at the carina. This should be retracted 3 cm. 2. Enteric tube noted ending overlying the body of the stomach. 3. Mild elevation of the left hemidiaphragm. Mild bibasilar atelectasis  seen. 4. Mild vascular congestion and mild cardiomegaly noted. These results were called by telephone at the time of interpretation on 04/06/2017 at 7:44 pm to Dr. Carmin Muskrat, who verbally acknowledged these results. Electronically Signed   By: Garald Balding M.D.   On: 04/06/2017 19:45   Dg Chest Port 1 View  Result Date: 04/06/2017 CLINICAL DATA:  56 y/o  F; possible medication overdose. EXAM: PORTABLE CHEST 1 VIEW COMPARISON:  56 y/o  F; 02/22/2016 chest radiograph. FINDINGS: Normal cardiac silhouette. Aortic atherosclerosis with calcification. Clear lungs. No pleural effusion or pneumothorax. Bones are unremarkable. IMPRESSION: No active disease. Electronically Signed   By: Kristine Garbe M.D.   On: 04/06/2017 16:38   Dg Toe 2nd Right  Result Date: 04/06/2017 CLINICAL DATA:  Right second toe open wound. Clinical concern for osteomyelitis. EXAM: RIGHT SECOND TOE COMPARISON:  Right foot dated 03/21/2017. FINDINGS: PA and 2 oblique views of the right second toe were obtained. There is no  lateral view. The patient was not able to cooperate for a true lateral view. There is soft tissue irregularity along the medial aspect of the third toe. No fracture, dislocation, bone erosion, periosteal reaction or soft tissue gas seen. IMPRESSION: No radiographic evidence of osteomyelitis. Electronically Signed   By: Claudie Revering M.D.   On: 04/06/2017 18:15     STUDIES:  CT head 5/24 >>  CULTURES: Blood 5/24 > Urine 5/24 >  ANTIBIOTICS: Zosyn 5/24 > Vancomycin 5/24 > 5/24  SIGNIFICANT EVENTS:   LINES/TUBES:   DISCUSSION:   ASSESSMENT / PLAN:  PULMONARY A: Need for mechanical ventilation in the setting of severe sepsis.  COPD without acute exacerbation  P:   Full vent support CXR in AM VAP bundle Scheduled Duoneb, budesonide  CARDIOVASCULAR A:  Paroxysmal atrial fibrillation Hypotension related to severe sepsis vs sedation induced: improved with volume H/o HTN,  CAD  P:  Telemetry monitoring Continued IVF gentle Lactic cleared Holding home amlodipine, amiodarone, amlodipine, losartan, spironolactone, torsemide  RENAL A:   AKI CKD  P:   Trend BMP Hydrate  GASTROINTESTINAL A:   Alcoholic cirrhosis  P:   NPO Protonix  HEMATOLOGIC A:   Warfarin coagulopathy  P:  Heparin infusion in lieu of warfarin Trend CBC, coags Heparin per pharmacy  INFECTIOUS A:   Severe sepsis secondary to UTI vs lower extremity cellulitis  P:   ABX as above Follow cultures Cycle PCT  ENDOCRINE A:   DM  P:   CBG monitoring and SSI  NEUROLOGIC A:   Acute metabolic encephalopathy History of substance abuse P:   RASS goal: 0 to -1 Propofol infusion PRN fentanyl for analgesia UDS CT head pending   FAMILY  - Updates:   - Inter-disciplinary family meet or Palliative Care meeting due by:  5/30    Georgann Housekeeper, AGACNP-BC [place] [facility] Pager [phone] or [phone]  04/06/2017 9:40 PM

## 2017-04-06 NOTE — ED Provider Notes (Signed)
[facility] DEPT Provider Note   CSN: [id number] Arrival date & time: 04/06/17  1608     History   Chief Complaint Chief Complaint  Patient presents with  . Altered Mental Status    HPI [name] is a 56 y.o. female.  HPI  Patient presents via EMS due to concern of altered mental status. The patient herself is minimally interactive, offers brief responses, but does deny nausea, etiology pain in her lower extremities, denies pain elsewhere, states that she feels terrible but cannot specify particularly why. EMS reports the patient has multiple medical issues including cirrhosis, heart failure, COPD, and has been receiving all medication by family members. Family also had concern of possible increased dose of Percocet over the past 8 days, though this seems to have been an extra 2 tablets each day over that timeframe.  Level V caveat secondary to altered mental status.  Past Medical History:  Diagnosis Date  . Alcohol abuse   . Alcoholic cirrhosis ([place])   . Alcoholic cirrhosis ([place])   . Anemia   . Anxiety   . B12 deficiency   . CAD (coronary artery disease)    a. cath 11/10/2014 LM nl, LAD min irregs, D1 30 ost, D2 50d, LCX 32m OM1 80 p/m (1.5 mm vessel), OM2 462mRCA nondom 9012mmed rx.. Demand ischemia in the setting of rapid a-fib.  . Cardiomyopathy (HCCSchaumb[place] . Carotid artery disease (HCCStickney  a. 01/2015 Carotid Angio: RICA [id number]/p L carotid endarterectomy 02/2015.  . 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 Last echo 12/205: EF 40-45%, inferoapical/posterior HK, not technically sufficient to allow eval of LV diastolic function, normal LA in size..  . Cocaine abuse   . COPD (chronic obstructive pulmonary disease) (HCCDonnellson . Critical lower limb ischemia   . Diabetic peripheral neuropathy (HCCElberta . Elevated troponin    a. Chronic elevation.  . GMarland KitchenRD (gastroesophageal reflux disease)   . Hyperlipemia   .  Hypertension   . Hypokalemia   . Hypomagnesemia   . Hypothyroidism   . Marijuana abuse   . Narcotic abuse   . Noncompliance   . NSVT (nonsustained ventricular tachycardia) (HCCLyle . Obesity   . PAF (paroxysmal atrial fibrillation) (HCCWoodcliff Lake . Paroxysmal atrial tachycardia (HCCVanderbilt . Paroxysmal atrial tachycardia (HCCKeyes . Peripheral arterial disease (HCCGeneva  a. 01/2015 Angio/PTA: LSFA 100 w/ recon @ adductor canal and 1 vessel runoff via AT, RSFA 99 (atherectomy/pta) - 1 vessel runoff via diff dzs peroneal.  . Poorly controlled type 2 diabetes mellitus (HCCElbow Lake . Renal insufficiency    a. 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  . SMarland Kitchenmptomatic bradycardia    avoid AV blocking agent per EP  . Tobacco abuse     Patient Active Problem List   Diagnosis Date Noted  . CKD (chronic kidney disease), stage III 11/24/2016  . Suspected sleep apnea 11/24/2016  . Severe obesity (BMI >= 40) (HCCSalamanca4/10/2016  . COPD GOLD 0  02/24/2016  . Chronic systolic CHF (congestive heart failure) (HCCFlowing Wells3/31/2017  . Encounter for therapeutic drug monitoring 02/10/2016  . Symptomatic bradycardia 01/12/2016  . Essential hypertension 12/22/2015  . Wheeze   . 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 Tobacco abuse 10/23/2015  . Coronary artery disease involving native coronary artery of native heart   . DOE (dyspnea on exertion) 04/29/2015  . PAF (paroxysmal atrial  fibrillation) ([place]) 01/16/2015  . Carotid arterial disease ([place]) 01/16/2015  . Claudication ([place]) 01/15/2015  . Demand ischemia ([employer]) 10/29/2014  . Insomnia 02/03/2014  . S/P peripheral artery angioplasty - [place] atherectomy; R SFA 09/11/2013    Class: Acute  . Leg pain, bilateral 08/19/2013  . Hypothyroidism 07/31/2013  . Cellulitis 06/13/2013  . History of cocaine abuse 06/13/2013  . Long term current use of anticoagulant therapy 05/20/2013  . Alcohol abuse   . Narcotic abuse   . Marijuana abuse   . Alcoholic cirrhosis ([place])   . DM (diabetes mellitus),  type 2 with peripheral vascular complications [facility])     Past Surgical History:  Procedure Laterality Date  . BALLOON ANGIOPLASTY, ARTERY Right 01/15/2015   SFA/notes 01/15/2015  . CARDIAC CATHETERIZATION    . CARDIAC CATHETERIZATION N/A 01/12/2016   Procedure: Temporary Wire;  Surgeon: Minus Breeding, MD;  Location: [facility] CV LAB;  Service: Cardiovascular;  Laterality: N/A;  . CARDIOVERSION  ~ 02/2013   "twice"   . CAROTID ANGIOGRAM N/A 01/15/2015   Procedure: CAROTID ANGIOGRAM;  Surgeon: [name], MD;  Location: [facility] CATH LAB;  Service: Cardiovascular;  Laterality: N/A;  . DILATION AND CURETTAGE OF UTERUS  1988  . ENDARTERECTOMY Left 02/19/2015   Procedure: LEFT CAROTID ENDARTERECTOMY ;  Surgeon: Serafina Mitchell, MD;  Location: [place];  Service: Vascular;  Laterality: Left;  . LEFT HEART CATHETERIZATION WITH CORONARY ANGIOGRAM N/A 10/31/2014   Procedure: LEFT HEART CATHETERIZATION WITH CORONARY ANGIOGRAM;  Surgeon: Burnell Blanks, MD;  Location: [facility] CATH LAB;  Service: Cardiovascular;  Laterality: N/A;  . LOWER EXTREMITY ANGIOGRAM N/A 09/10/2013   Procedure: LOWER EXTREMITY ANGIOGRAM;  Surgeon: [name], MD;  Location: [facility] CATH LAB;  Service: Cardiovascular;  Laterality: N/A;  . LOWER EXTREMITY ANGIOGRAM N/A 01/15/2015   Procedure: LOWER EXTREMITY ANGIOGRAM;  Surgeon: [name], MD;  Location: [facility] CATH LAB;  Service: Cardiovascular;  Laterality: N/A;  . PERIPHERAL ATHRECTOMY Right 01/15/2015   SFA/notes 01/15/2015    OB History    Gravida Para Term Preterm AB Living   _0 SAB TAB Ectopic Multiple Live Births                   Home Medications    Prior to Admission medications   Medication Sig Start Date End Date Taking? Authorizing Provider  albuterol (PROVENTIL HFA;VENTOLIN HFA) 108 (90 Base) MCG/ACT inhaler Inhale 2 puffs into the lungs every 6 (six) hours as needed for wheezing or shortness of breath. 03/01/16   Arnoldo Morale, MD  albuterol  (PROVENTIL) (2.5 MG/3ML) 0.083% nebulizer solution Take 3 mLs (2.5 mg total) by nebulization every 6 (six) hours as needed. For wheezing. 06/30/15   Arnoldo Morale, MD  amiodarone (PACERONE) 100 MG tablet TAKE 1 TABLET BY MOUTH ONCE DAILY 12/30/16   Larey Dresser, MD  amLODipine (NORVASC) 10 MG tablet Take 10 mg by mouth daily.    [provider]  amoxicillin-clavulanate (AUGMENTIN) 875-125 MG tablet Take 1 tablet by mouth 2 (two) times daily. for 10 days 03/10/17   [provider]  atorvastatin (LIPITOR) 40 MG tablet TAKE 1 TABLET BY MOUTH ONCE EVERY EVENING 02/24/17   Bensimhon, Shaune Pascal, MD  bisoprolol (ZEBETA) 5 MG tablet TAKE 1/2 TABLET BY MOUTH EVERY DAY 12/30/16   Larey Dresser, MD  Blood Glucose Monitoring Suppl (ACCU-CHEK AVIVA PLUS) W/DEVICE KIT 1 Device by Does not apply route 4 (  four) times daily -  before meals and at bedtime. 07/21/15   Arnoldo Morale, MD  cholecalciferol (VITAMIN D) 1000 units tablet Take 1,000 Units by mouth daily.    [provider]  ciprofloxacin (CIPRO) 500 MG tablet Take 1 tablet (500 mg total) by mouth every 12 (twelve) hours. Patient not taking: Reported on 03/23/2017 03/15/17   Long, Wonda Olds, MD  cyanocobalamin 1000 MCG tablet Take 1 tablet (1,000 mcg total) by mouth daily. 05/11/16   Lyda Jester M, PA-C  folic acid (FOLVITE) 1 MG tablet TAKE 1 TABLET BY MOUTH EVERY DAY 11/30/16   [name], MD  gabapentin (NEURONTIN) 300 MG capsule Take 300 mg by mouth 4 (four) times daily.    [provider]  insulin aspart (NOVOLOG FLEXPEN) 100 UNIT/ML FlexPen Inject 24-44 Units into the skin 3 (three) times daily with meals.     [provider]  Insulin Glargine (LANTUS SOLOSTAR) 100 UNIT/ML Solostar Pen Inject 80 Units into the skin daily at 10 pm.     [provider]  Insulin Pen Needle (ULTICARE MICRO PEN NEEDLES) 32G X 4 MM MISC 1 Syringe by Does not apply route 4 (four) times daily - after meals and at  bedtime. 06/30/15   Arnoldo Morale, MD  Lancets (ACCU-CHEK SOFT TOUCH) lancets Use as instructed 07/21/15   Arnoldo Morale, MD  levothyroxine (SYNTHROID, LEVOTHROID) 50 MCG tablet Take 1 tablet (50 mcg total) by mouth daily. 06/30/15   Arnoldo Morale, MD  losartan (COZAAR) 25 MG tablet Take 1 tablet (25 mg total) by mouth daily. 08/22/16 11/11/17  Shirley Friar, PA-C  metolazone (ZAROXOLYN) 2.5 MG tablet TAKE 1 TABLET BY MOUTH ONCE A WEEK ON THURSDAY. MAY TAKE AN ADDITIONAL TABLET AS DIRECTED BY HF CLINIC. 01/27/17   Bensimhon, Shaune Pascal, MD  Multiple Vitamin (MULTIVITAMIN) tablet Take 1 tablet by mouth daily.    [provider]  nitroGLYCERIN (NITROSTAT) 0.4 MG SL tablet Place 1 tablet (0.4 mg total) under the tongue every 5 (five) minutes as needed for chest pain. Patient not taking: Reported on 03/16/2017 01/16/15   Rogelia Mire, NP  oxyCODONE-acetaminophen (PERCOCET/ROXICET) 5-325 MG tablet Take 2 tablets by mouth every 4 (four) hours as needed for severe pain. 03/15/17   Long, Wonda Olds, MD  potassium chloride SA (K-DUR,KLOR-CON) 20 MEQ tablet Take 1 tablet (20 mEq total) by mouth daily. Take an additional 20 meq (1 tab) on Thursday with metolazone 03/01/17   Clegg, [name] D, NP  spironolactone (ALDACTONE) 25 MG tablet Take 1 tablet (25 mg total) by mouth daily. 03/23/17   Larey Dresser, MD  SYMBICORT 160-4.5 MCG/ACT inhaler INHALE 2 PUFFS INTO THE LUNGS TWICE DAILY 11/04/16   Clegg, [name] D, NP  torsemide (DEMADEX) 20 MG tablet Take 2 tablets (40 mg total) by mouth 2 (two) times daily. 02/24/17   Larey Dresser, MD  warfarin (COUMADIN) 5 MG tablet TAKE 1 TABLET TO 1 AND 1/2 TABLETS BY MOUTH EVERY DAY AS DIRECTED BY COUMADIN CLINIC Patient taking differently: Take 1 and 1/2 tablets (7.62m) on Mondays and Fridays. Take 1 tablet (528m once daily on every other day 01/27/17   BeLorretta HarpMD    Family History Family History  Problem Relation Age of Onset  . Hypertension Mother   .  Diabetes Mother   . Cancer Mother        breast, ovarian, colon  . Clotting disorder Mother   . Heart disease Mother   .  Heart attack Mother   . Breast cancer Mother   . Hypertension Father   . Heart disease Father   . Emphysema Sister        smoked    Social History Social History  Substance Use Topics  . Smoking status: Current Some Day Smoker    Packs/day: 0.00    Years: 40.00    Types: E-cigarettes    Last attempt to quit: 11/03/2015  . Smokeless tobacco: Former Systems developer    Types: Snuff  . Alcohol use No     Comment: former alcohol abuse     Allergies   Gabapentin   Review of Systems Review of Systems  Unable to perform ROS: Mental status change     Physical Exam Updated Vital Signs LMP 11/27/2012   Physical Exam  Constitutional:  Obese, unwell-appearing female who appears older than stated age, minimally interactive, but awake, with a patent airway.  HENT:  Head: Normocephalic and atraumatic.  Eyes: Conjunctivae and EOM are normal.  Cardiovascular: Normal rate and regular rhythm.   Pulmonary/Chest: No stridor.  Diminished breath sounds everywhere, trace wheezing in the left upper lung field  Abdominal: She exhibits distension.  Protuberant abdomen, nontender, multiple areas of ecchymosis throughout the lower abdomen.  Musculoskeletal: She exhibits edema.  Patient has bilateral lower extremity chronic wounds, no ascending erythema on either leg  Neurological: She is alert. She displays atrophy and tremor.  Patient has baseline tremor, previously attributed to gabapentin. Face is symmetric, speech is slow, patient is disoriented, does not know the day, month, president.   Skin: Skin is warm and dry.  Psychiatric: Her speech is delayed. She is slowed and withdrawn. Cognition and memory are impaired.  Nursing note and vitals reviewed.        ED Treatments / Results  Labs (all labs ordered are listed, but only abnormal results are displayed) Labs  Reviewed  COMPREHENSIVE METABOLIC PANEL - Abnormal; Notable for the following:       Result Value   Chloride 99 (*)    Glucose, Bld 284 (*)    BUN 89 (*)    Creatinine, Ser 1.99 (*)    Total Protein 8.4 (*)    Alkaline Phosphatase 161 (*)    GFR calc non Af Amer 27 (*)    GFR calc Af Amer 32 (*)    All other components within normal limits  CBC WITH DIFFERENTIAL/PLATELET - Abnormal; Notable for the following:    WBC 34.9 (*)    Hemoglobin 11.7 (*)    HCT 35.7 (*)    RDW 15.7 (*)    Neutro Abs 32.8 (*)    All other components within normal limits  PROTIME-INR - Abnormal; Notable for the following:    Prothrombin Time 25.2 (*)    All other components within normal limits  URINALYSIS, ROUTINE W REFLEX MICROSCOPIC - Abnormal; Notable for the following:    APPearance HAZY (*)    Glucose, UA >=500 (*)    Hgb urine dipstick SMALL (*)    Nitrite POSITIVE (*)    Bacteria, UA MANY (*)    Squamous Epithelial / LPF 0-5 (*)    All other components within normal limits  BRAIN NATRIURETIC PEPTIDE - Abnormal; Notable for the following:    B Natriuretic Peptide 240.1 (*)    All other components within normal limits  ACETAMINOPHEN LEVEL - Abnormal; Notable for the following:    Acetaminophen (Tylenol), Serum <10 (*)    All other components within normal  limits  I-STAT CG4 LACTIC ACID, ED - Abnormal; Notable for the following:    Lactic Acid, Venous 2.80 (*)    All other components within normal limits  URINE CULTURE  ETHANOL    EKG  EKG Interpretation  Date/Time:  Thursday Apr 06 2017 16:17:15 EDT Ventricular Rate:  98 PR Interval:    QRS Duration: 89 QT Interval:  368 QTC Calculation: 470 R Axis:   16 Text Interpretation:  Age not entered, assumed to be  56 years old for purpose of ECG interpretation Sinus rhythm Low voltage, precordial leads Minimal ST depression, lateral leads No significant change since last tracing Abnormal ekg Confirmed by Carmin Muskrat [id number]) on 04/06/2017  4:22:35 PM       Radiology Dg Chest Port 1 View  Result Date: 04/06/2017 CLINICAL DATA:  56 y/o  F; possible medication overdose. EXAM: PORTABLE CHEST 1 VIEW COMPARISON:  56 y/o  F; 02/22/2016 chest radiograph. FINDINGS: Normal cardiac silhouette. Aortic atherosclerosis with calcification. Clear lungs. No pleural effusion or pneumothorax. Bones are unremarkable. IMPRESSION: No active disease. Electronically Signed   By: Kristine Garbe M.D.   On: 04/06/2017 16:38    Procedures Procedures (including critical care time)  Medications Ordered in ED Medications  vancomycin (VANCOCIN) IVPB 1000 mg/200 mL premix (not administered)  piperacillin-tazobactam (ZOSYN) IVPB 3.375 g (not administered)  0.9 %  sodium chloride infusion (not administered)   Initial labs notable for lactic acidosis, and leukocytosis greater than 30,000. On repeat exam, after exposure of the patient's lower extremities, distally, she is on have erythema throughout the left foot, as well as chronic bilateral ankle sores. Notably, the patient also has severe wound of her dorsal right second toe, distal interphalangeal joint, with purulent discharge, full dermal depth wound.  Initial Impression / Assessment and Plan / ED Course  I have reviewed the triage vital signs and the nursing notes.  Pertinent labs & imaging results that were available during my care of the patient were reviewed by me and considered in my medical decision making (see chart for details).  Immediately after the initial evaluation the patient on broad differential was considered, with multiple labs, CT, x-ray is ordered. I reviewed the patient's vital signs from EMS transport, generally unremarkable.  Patient has received spectrum antibiotics, vancomycin, Zosyn, additional labs pending, CT scan pending.  5:36 PM Patient remains distant, answers questions inconsistently.  Update:, Patient remains distant, now less interactive. Attempted  conversation with her regarding goals of care, given concern for worsening altered mental status, evidence for sepsis. Temperature has no increased from normal to 103.  Patient has received rectal Tylenol. After the patient was unable to answer questions goals care discussed her case with her sister, who confirmed that the patient is considering goals in the larger since, but has no firm decisions made yet on her preferences. Subsequently I attempted to discuss importance of intubation with the patient, this was minimally received. Patient then was intubated, without complication.   INTUBATION Performed by: Carmin Muskrat  Required items: required blood products, implants, devices, and special equipment available Patient identity confirmed: provided demographic data and hospital-assigned identification number Time out: Immediately prior to procedure a "time out" was called to verify the correct patient, procedure, equipment, support staff and site/side marked as required.  Indications: airway protection / sepsis  Intubation method: Glidescope Laryngoscopy   Preoxygenation: BVM  Sedatives: 20Etomidate Paralytic: 100Succinylcholine  Tube Size: 7.5 cuffed  Post-procedure assessment: chest rise and ETCO2 monitor Breath sounds: equal and  absent over the epigastrium Tube secured with: ETT holder Chest x-ray interpreted by radiologist and me.  Chest x-ray findings: endotracheal tube in appropriate position  Patient tolerated the procedure well with no immediate complications.  I reviewed the patient's case with our critical care team.   7:59 PM Patient required propofol bolus, drip for sedation.   Final Clinical Impressions(s) / ED Diagnoses  Altered mental status Fever Cellulitis Urinary tract infection  CRITICAL CARE Performed by: Carmin Muskrat Total critical care time: 40 minutes Critical care time was exclusive of separately billable procedures and treating other  patients. Critical care was necessary to treat or prevent imminent or life-threatening deterioration. Critical care was time spent personally by me on the following activities: development of treatment plan with patient and/or surrogate as well as nursing, discussions with consultants, evaluation of patient's response to treatment, examination of patient, obtaining history from patient or surrogate, ordering and performing treatments and interventions, ordering and review of laboratory studies, ordering and review of radiographic studies, pulse oximetry and re-evaluation of patient's condition.    Carmin Muskrat, MD 04/06/17 2001

## 2017-04-06 NOTE — Procedures (Signed)
Intubation Procedure Note IVIE SAVITT [id number] 1961/04/19  Procedure: Intubation Indications: Airway protection and maintenance  Procedure Details Consent: Unable to obtain consent because of altered level of consciousness. Time Out: Verified patient identification, verified procedure, site/side was marked, verified correct patient position, special equipment/implants available, medications/allergies/relevent history reviewed, required imaging and test results available.  Performed  Maximum sterile technique was used including gloves, hand hygiene and mask.  MAC and 3    Evaluation Hemodynamic Status: BP stable throughout; O2 sats: stable throughout Patient's Current Condition: stable Complications: No apparent complications Patient did tolerate procedure well. Chest X-ray ordered to verify placement.  CXR: tube position low-repostitioned.   ETT was pulled back 3cm to 22 at the lip after CXR per MD verbal order.  Vivien Rossetti 04/06/2017

## 2017-04-07 ENCOUNTER — Inpatient Hospital Stay (HOSPITAL_COMMUNITY): Payer: Medicare Other

## 2017-04-07 DIAGNOSIS — I96 Gangrene, not elsewhere classified: Secondary | ICD-10-CM

## 2017-04-07 LAB — BLOOD GAS, ARTERIAL
Acid-Base Excess: 0.7 mmol/L (ref 0.0–2.0)
Bicarbonate: 25.3 mmol/L (ref 20.0–28.0)
Drawn by: 418751
FIO2: 50
MECHVT: 420 mL
O2 Saturation: 97.6 %
PEEP: 5 cmH2O
Patient temperature: 98.6
RATE: 14 {breaths}/min
pCO2 arterial: 43.9 mmHg (ref 32.0–48.0)
pH, Arterial: 7.378 (ref 7.350–7.450)
pO2, Arterial: 104 mmHg (ref 83.0–108.0)

## 2017-04-07 LAB — CBC
HCT: 30.9 % — ABNORMAL LOW (ref 36.0–46.0)
Hemoglobin: 9.9 g/dL — ABNORMAL LOW (ref 12.0–15.0)
MCH: 30.6 pg (ref 26.0–34.0)
MCHC: 32 g/dL (ref 30.0–36.0)
MCV: 95.4 fL (ref 78.0–100.0)
Platelets: 296 10*3/uL (ref 150–400)
RBC: 3.24 MIL/uL — ABNORMAL LOW (ref 3.87–5.11)
RDW: 16.3 % — ABNORMAL HIGH (ref 11.5–15.5)
WBC: 22 10*3/uL — ABNORMAL HIGH (ref 4.0–10.5)

## 2017-04-07 LAB — GLUCOSE, CAPILLARY
GLUCOSE-CAPILLARY: 351 mg/dL — AB (ref 65–99)
Glucose-Capillary: 265 mg/dL — ABNORMAL HIGH (ref 65–99)
Glucose-Capillary: 373 mg/dL — ABNORMAL HIGH (ref 65–99)
Glucose-Capillary: 240 mg/dL — ABNORMAL HIGH (ref 65–99)
Glucose-Capillary: 310 mg/dL — ABNORMAL HIGH (ref 65–99)

## 2017-04-07 LAB — BASIC METABOLIC PANEL
Anion gap: 12 (ref 5–15)
BUN: 81 mg/dL — AB (ref 6–20)
CALCIUM: 8.8 mg/dL — AB (ref 8.9–10.3)
CO2: 23 mmol/L (ref 22–32)
CREATININE: 1.8 mg/dL — AB (ref 0.44–1.00)
Chloride: 104 mmol/L (ref 101–111)
GFR calc non Af Amer: 31 mL/min — ABNORMAL LOW (ref >60)
GFR, EST AFRICAN AMERICAN: 36 mL/min — AB (ref >60)
GLUCOSE: 327 mg/dL — AB (ref 65–99)
Potassium: 4.1 mmol/L (ref 3.5–5.1)
Sodium: 139 mmol/L (ref 135–145)

## 2017-04-07 LAB — PROTIME-INR
INR: 2.16
INR: 2.33
PROTHROMBIN TIME: 24.4 s — AB (ref 11.4–15.2)
PROTHROMBIN TIME: 26 s — AB (ref 11.4–15.2)

## 2017-04-07 LAB — TROPONIN I: TROPONIN I: 0.06 ng/mL — AB (ref <0.03)

## 2017-04-07 LAB — RAPID URINE DRUG SCREEN, HOSP PERFORMED
Amphetamines: NOT DETECTED
Barbiturates: NOT DETECTED
Benzodiazepines: NOT DETECTED
Cocaine: NOT DETECTED
Opiates: POSITIVE — AB
Tetrahydrocannabinol: NOT DETECTED

## 2017-04-07 LAB — MRSA PCR SCREENING: MRSA BY PCR: NEGATIVE

## 2017-04-07 LAB — HIV ANTIBODY (ROUTINE TESTING W REFLEX): HIV Screen 4th Generation wRfx: NONREACTIVE

## 2017-04-07 LAB — PROCALCITONIN: Procalcitonin: 2.13 ng/mL

## 2017-04-07 MED ORDER — LEVOTHYROXINE SODIUM 50 MCG PO TABS
50.0000 ug | ORAL_TABLET | Freq: Every day | ORAL | Status: DC
  Administered 2017-04-08 – 2017-04-14 (×6): 50 ug via ORAL
  Filled 2017-04-07 (×6): qty 1

## 2017-04-07 MED ORDER — INSULIN ASPART 100 UNIT/ML ~~LOC~~ SOLN
0.0000 [IU] | Freq: Every day | SUBCUTANEOUS | Status: DC
  Administered 2017-04-07: 4 [IU] via SUBCUTANEOUS
  Administered 2017-04-08 – 2017-04-09 (×2): 3 [IU] via SUBCUTANEOUS
  Administered 2017-04-10 – 2017-04-13 (×2): 2 [IU] via SUBCUTANEOUS

## 2017-04-07 MED ORDER — VITAMIN B-12 1000 MCG PO TABS
1000.0000 ug | ORAL_TABLET | Freq: Every day | ORAL | Status: DC
  Administered 2017-04-07 – 2017-04-14 (×8): 1000 ug via ORAL
  Filled 2017-04-07 (×8): qty 1

## 2017-04-07 MED ORDER — BISOPROLOL FUMARATE 5 MG PO TABS
2.5000 mg | ORAL_TABLET | Freq: Every day | ORAL | Status: DC
  Administered 2017-04-07 – 2017-04-14 (×8): 2.5 mg via ORAL
  Filled 2017-04-07 (×7): qty 0.5
  Filled 2017-04-07: qty 1
  Filled 2017-04-07: qty 0.5
  Filled 2017-04-07: qty 1

## 2017-04-07 MED ORDER — AMIODARONE HCL 200 MG PO TABS
100.0000 mg | ORAL_TABLET | Freq: Every day | ORAL | Status: DC
Start: 2017-04-07 — End: 2017-04-14
  Administered 2017-04-07 – 2017-04-14 (×8): 100 mg via ORAL
  Filled 2017-04-07 (×8): qty 1

## 2017-04-07 MED ORDER — INSULIN ASPART 100 UNIT/ML ~~LOC~~ SOLN
0.0000 [IU] | Freq: Three times a day (TID) | SUBCUTANEOUS | Status: DC
  Administered 2017-04-07: 7 [IU] via SUBCUTANEOUS
  Administered 2017-04-07: 20 [IU] via SUBCUTANEOUS
  Administered 2017-04-08: 11 [IU] via SUBCUTANEOUS
  Administered 2017-04-08: 20 [IU] via SUBCUTANEOUS
  Administered 2017-04-08: 15 [IU] via SUBCUTANEOUS
  Administered 2017-04-09: 20 [IU] via SUBCUTANEOUS
  Administered 2017-04-09: 7 [IU] via SUBCUTANEOUS
  Administered 2017-04-09: 11 [IU] via SUBCUTANEOUS
  Administered 2017-04-10: 15 [IU] via SUBCUTANEOUS
  Administered 2017-04-10: 7 [IU] via SUBCUTANEOUS
  Administered 2017-04-10: 20 [IU] via SUBCUTANEOUS
  Administered 2017-04-11 (×3): 11 [IU] via SUBCUTANEOUS
  Administered 2017-04-12: 4 [IU] via SUBCUTANEOUS
  Administered 2017-04-12: 7 [IU] via SUBCUTANEOUS
  Administered 2017-04-12 – 2017-04-13 (×2): 15 [IU] via SUBCUTANEOUS
  Administered 2017-04-13 – 2017-04-14 (×2): 7 [IU] via SUBCUTANEOUS

## 2017-04-07 MED ORDER — DULOXETINE HCL 20 MG PO CPEP
20.0000 mg | ORAL_CAPSULE | Freq: Every day | ORAL | Status: DC
  Administered 2017-04-07 – 2017-04-14 (×7): 20 mg via ORAL
  Filled 2017-04-07 (×8): qty 1

## 2017-04-07 MED ORDER — TORSEMIDE 20 MG PO TABS
40.0000 mg | ORAL_TABLET | Freq: Two times a day (BID) | ORAL | Status: DC
  Administered 2017-04-07 – 2017-04-14 (×13): 40 mg via ORAL
  Filled 2017-04-07 (×13): qty 2

## 2017-04-07 MED ORDER — VITAMIN D 1000 UNITS PO TABS
1000.0000 [IU] | ORAL_TABLET | Freq: Every day | ORAL | Status: DC
  Administered 2017-04-07 – 2017-04-14 (×8): 1000 [IU] via ORAL
  Filled 2017-04-07 (×8): qty 1

## 2017-04-07 MED ORDER — LOSARTAN POTASSIUM 50 MG PO TABS
25.0000 mg | ORAL_TABLET | Freq: Every day | ORAL | Status: DC
  Administered 2017-04-07 – 2017-04-14 (×8): 25 mg via ORAL
  Filled 2017-04-07 (×8): qty 1

## 2017-04-07 MED ORDER — PANTOPRAZOLE SODIUM 40 MG PO TBEC
40.0000 mg | DELAYED_RELEASE_TABLET | Freq: Every day | ORAL | Status: DC
  Administered 2017-04-07 – 2017-04-13 (×7): 40 mg via ORAL
  Filled 2017-04-07 (×7): qty 1

## 2017-04-07 MED ORDER — AMLODIPINE BESYLATE 10 MG PO TABS
10.0000 mg | ORAL_TABLET | Freq: Every day | ORAL | Status: DC
  Administered 2017-04-07 – 2017-04-14 (×7): 10 mg via ORAL
  Filled 2017-04-07 (×8): qty 1

## 2017-04-07 MED ORDER — FOLIC ACID 1 MG PO TABS
1.0000 mg | ORAL_TABLET | Freq: Every day | ORAL | Status: DC
  Administered 2017-04-07 – 2017-04-14 (×8): 1 mg via ORAL
  Filled 2017-04-07 (×8): qty 1

## 2017-04-07 MED ORDER — INSULIN GLARGINE 100 UNIT/ML ~~LOC~~ SOLN
20.0000 [IU] | Freq: Every day | SUBCUTANEOUS | Status: DC
  Administered 2017-04-07: 20 [IU] via SUBCUTANEOUS
  Filled 2017-04-07 (×2): qty 0.2

## 2017-04-07 MED ORDER — FENTANYL CITRATE (PF) 100 MCG/2ML IJ SOLN
50.0000 ug | INTRAMUSCULAR | Status: DC | PRN
  Administered 2017-04-07 (×3): 50 ug via INTRAVENOUS
  Administered 2017-04-07 (×2): 100 ug via INTRAVENOUS
  Administered 2017-04-07: 50 ug via INTRAVENOUS
  Filled 2017-04-07 (×6): qty 2

## 2017-04-07 MED ORDER — ATORVASTATIN CALCIUM 40 MG PO TABS
40.0000 mg | ORAL_TABLET | Freq: Every day | ORAL | Status: DC
Start: 2017-04-07 — End: 2017-04-14
  Administered 2017-04-07 – 2017-04-13 (×7): 40 mg via ORAL
  Filled 2017-04-07 (×7): qty 1

## 2017-04-07 MED ORDER — ADULT MULTIVITAMIN W/MINERALS CH
1.0000 | ORAL_TABLET | Freq: Every day | ORAL | Status: DC
  Administered 2017-04-07 – 2017-04-14 (×8): 1 via ORAL
  Filled 2017-04-07 (×8): qty 1

## 2017-04-07 MED ORDER — SPIRONOLACTONE 25 MG PO TABS
25.0000 mg | ORAL_TABLET | Freq: Every day | ORAL | Status: DC
Start: 2017-04-07 — End: 2017-04-14
  Administered 2017-04-07 – 2017-04-14 (×8): 25 mg via ORAL
  Filled 2017-04-07 (×8): qty 1

## 2017-04-07 MED ORDER — PRO-STAT SUGAR FREE PO LIQD
30.0000 mL | Freq: Two times a day (BID) | ORAL | Status: DC
  Administered 2017-04-08 – 2017-04-14 (×11): 30 mL via ORAL
  Filled 2017-04-07 (×14): qty 30

## 2017-04-07 MED ORDER — ACETAMINOPHEN 325 MG PO TABS
650.0000 mg | ORAL_TABLET | Freq: Four times a day (QID) | ORAL | Status: DC | PRN
  Administered 2017-04-07 – 2017-04-10 (×2): 650 mg via ORAL
  Filled 2017-04-07 (×2): qty 2

## 2017-04-07 NOTE — Care Management Note (Signed)
Case Management Note  Patient Details  Name: [name] MRN: [id number] Date of Birth: Mar 16, 1961  Subjective/Objective:    Pt admitted with sepsis from diabetic wounds                    Action/Plan:   PTA from home.   Expected Discharge Date:                  Expected Discharge Plan:     In-House Referral:     Discharge planning Services  CM Consult  Post Acute Care Choice:    Choice offered to:     DME Arranged:    DME Agency:     HH Arranged:    HH Agency:     Status of Service:     If discussed at [employer], dates discussed:    Additional Comments:  Maryclare Labrador, RN 04/07/2017, 4:11 PM

## 2017-04-07 NOTE — Consult Note (Signed)
ORTHOPAEDIC CONSULTATION  REQUESTING PHYSICIAN: Chesley Mires, MD  Chief Complaint: Painful ischemic changes right foot  HPI: [name] is a 56 y.o. 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 She has been followed by Dr. Alvester Chou for her peripheral vascular disease she states she's undergone several catheterizations to improve circulation for her legs and she states that she was scheduled for follow-up with Dr. Gwenlyn Found for evaluation to improve circulation in the right lower extremity.  Past Medical History:  Diagnosis Date  . Alcohol abuse   . Alcoholic cirrhosis ([place])   . Alcoholic cirrhosis ([place])   . Anemia   . Anxiety   . B12 deficiency   . CAD (coronary artery disease)    a. cath 11/10/2014 LM nl, LAD min irregs, D1 30 ost, D2 50d, LCX 64m, OM1 80 p/m (1.5 mm vessel), OM2 6m, RCA nondom 39m-->med rx.. Demand ischemia in the setting of rapid a-fib.  . Cardiomyopathy ([place])   . Carotid artery disease ([place])    a. 01/2015 Carotid Angio: RICA 379, LICA 02I. s/p L carotid endarterectomy 02/2015.  [name] Chronic combined systolic and diastolic CHF (congestive heart failure) ([place])    a. Last echo 12/205: EF 40-45%, inferoapical/posterior HK, not technically sufficient to allow eval of LV diastolic function, normal LA in size..  . Cocaine abuse   . COPD (chronic obstructive pulmonary disease) ([place])   . Critical lower limb ischemia   . Diabetic peripheral neuropathy ([place])   . Elevated troponin    a. Chronic elevation.  [name] GERD (gastroesophageal reflux disease)   . Hyperlipemia   . Hypertension   . Hypokalemia   . Hypomagnesemia   . Hypothyroidism   . Marijuana abuse   . Narcotic abuse   . Noncompliance   . NSVT (nonsustained ventricular tachycardia) ([place])   . Obesity   . PAF (paroxysmal atrial fibrillation) ([place])   . Paroxysmal atrial tachycardia ([place])   . Paroxysmal atrial tachycardia  ([place])   . Peripheral arterial disease ([place])    a. 01/2015 Angio/PTA: LSFA 100 w/ recon @ adductor canal and 1 vessel runoff via AT, RSFA 99 (atherectomy/pta) - 1 vessel runoff via diff dzs peroneal.  . Poorly controlled type 2 diabetes mellitus ([place])   . Renal insufficiency    a. 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  [name] Symptomatic bradycardia    avoid AV blocking agent per EP  . Tobacco abuse    Past Surgical History:  Procedure Laterality Date  . BALLOON ANGIOPLASTY, ARTERY Right 01/15/2015   SFA/notes 01/15/2015  . CARDIAC CATHETERIZATION    . CARDIAC CATHETERIZATION N/A 01/12/2016   Procedure: Temporary Wire;  Surgeon: Minus Breeding, MD;  Location: [facility];  Service: Cardiovascular;  Laterality: N/A;  . CARDIOVERSION  ~ 02/2013   "twice"   . CAROTID ANGIOGRAM N/A 01/15/2015   Procedure: CAROTID ANGIOGRAM;  Surgeon: [name], MD;  Location: [facility] CATH LAB;  Service: Cardiovascular;  Laterality: N/A;  . DILATION AND CURETTAGE OF UTERUS  1988  . ENDARTERECTOMY Left 02/19/2015   Procedure: LEFT CAROTID ENDARTERECTOMY ;  Surgeon: Serafina Mitchell, MD;  Location: [facility];  Service: Vascular;  Laterality: Left;  . LEFT HEART CATHETERIZATION WITH CORONARY ANGIOGRAM N/A 10/31/2014   Procedure: LEFT HEART CATHETERIZATION WITH CORONARY ANGIOGRAM;  Surgeon: Burnell Blanks, MD;  Location: [facility] CATH LAB;  Service: Cardiovascular;  Laterality: N/A;  . LOWER EXTREMITY ANGIOGRAM N/A 09/10/2013  Procedure: LOWER EXTREMITY ANGIOGRAM;  Surgeon: [name], MD;  Location: [facility] CATH LAB;  Service: Cardiovascular;  Laterality: N/A;  . LOWER EXTREMITY ANGIOGRAM N/A 01/15/2015   Procedure: LOWER EXTREMITY ANGIOGRAM;  Surgeon: [name], MD;  Location: [facility] C[facility] LAB;  Service: Cardiovascular;  Laterality: N/A;  . PERIPHERAL ATHRECTOMY Right 01/15/2015   SFA/notes 01/15/2015   Social History   Social History  . Marital status: Single    Spouse name: N/A  . Number of children: N/A  . Years of education:  N/A   Occupational History  . disabled    Social History Main Topics  . Smoking status: Current Some Day Smoker    Packs/day: 0.00    Years: 40.00    Types: E-cigarettes    Last attempt to quit: 11/03/2015  . Smokeless tobacco: Former Systems developer    Types: Snuff  . Alcohol use No     Comment: former alcohol abuse  . Drug use: No     Comment: 04/29/2015 "last drug use was ~ 09/08/2013"  . Sexual activity: No   Other Topics Concern  . None   Social History Narrative   Lives in [place], in [place] with sister.  They are looking to move but don't have a place to go yet.     Family History  Problem Relation Age of Onset  . Hypertension Mother   . Diabetes Mother   . Cancer Mother        breast, ovarian, colon  . Clotting disorder Mother   . Heart disease Mother   . Heart attack Mother   . Breast cancer Mother   . Hypertension Father   . Heart disease Father   . Emphysema Sister        smoked   - negative except otherwise stated in the family history section Allergies  Allergen Reactions  . Gabapentin Nausea And Vomiting    POSSIBLE SHAKING? TAKING MED CURRENTLY   Prior to Admission medications   Medication Sig Start Date End Date Taking? Authorizing Provider  acetaminophen-codeine (TYLENOL #3) 300-30 MG tablet Take 1 tablet by mouth 2 (two) times daily as needed for pain. 03/29/17  Yes [provider]  albuterol (PROVENTIL HFA;VENTOLIN HFA) 108 (90 Base) MCG/ACT inhaler Inhale 2 puffs into the lungs every 6 (six) hours as needed for wheezing or shortness of breath. 03/01/16  Yes Arnoldo Morale, MD  albuterol (PROVENTIL) (2.5 MG/3ML) 0.083% nebulizer solution Take 3 mLs (2.5 mg total) by nebulization every 6 (six) hours as needed. For wheezing. 06/30/15  Yes Arnoldo Morale, MD  amiodarone (PACERONE) 100 MG tablet TAKE 1 TABLET BY MOUTH ONCE DAILY 12/30/16  Yes Larey Dresser, MD  amLODipine (NORVASC) 10 MG tablet Take 10 mg by mouth daily.   Yes [provider]    atorvastatin (LIPITOR) 40 MG tablet TAKE 1 TABLET BY MOUTH ONCE EVERY EVENING 02/24/17  Yes Bensimhon, Shaune Pascal, MD  bisoprolol (ZEBETA) 5 MG tablet TAKE 1/2 TABLET BY MOUTH EVERY DAY 12/30/16  Yes Larey Dresser, MD  cholecalciferol (VITAMIN D) 1000 units tablet Take 1,000 Units by mouth daily.   Yes [provider]  cyanocobalamin 1000 MCG tablet Take 1 tablet (1,000 mcg total) by mouth daily. 05/11/16  Yes Simmons, Brittainy M, PA-C  folic acid (FOLVITE) 1 MG tablet TAKE 1 TABLET BY MOUTH EVERY DAY 11/30/16  Yes [name], MD  gabapentin (NEURONTIN) 300 MG capsule Take 300 mg by mouth 4 (four) times daily.   Yes  [provider]  insulin aspart (NOVOLOG FLEXPEN) 100 UNIT/ML FlexPen Inject 44 Units into the skin 3 (three) times daily with meals.    Yes [provider]  Insulin Glargine (LANTUS SOLOSTAR) 100 UNIT/ML Solostar Pen Inject 80 Units into the skin daily at 10 pm.    Yes [provider]  levothyroxine (SYNTHROID, LEVOTHROID) 50 MCG tablet Take 1 tablet (50 mcg total) by mouth daily. 06/30/15  Yes Arnoldo Morale, MD  losartan (COZAAR) 25 MG tablet Take 1 tablet (25 mg total) by mouth daily. 08/22/16 11/11/17 Yes TillerySatira Mccallum, PA-C  metolazone (ZAROXOLYN) 2.5 MG tablet TAKE 1 TABLET BY MOUTH ONCE A WEEK ON THURSDAY. MAY TAKE AN ADDITIONAL TABLET AS DIRECTED BY HF CLINIC. 01/27/17  Yes Bensimhon, Shaune Pascal, MD  Multiple Vitamin (MULTIVITAMIN) tablet Take 1 tablet by mouth daily.   Yes [provider]  potassium chloride SA (K-DUR,KLOR-CON) 20 MEQ tablet Take 1 tablet (20 mEq total) by mouth daily. Take an additional 20 meq (1 tab) on Thursday with metolazone 03/01/17  Yes Clegg, [name] D, NP  spironolactone (ALDACTONE) 25 MG tablet Take 1 tablet (25 mg total) by mouth daily. 03/23/17  Yes Larey Dresser, MD  torsemide (DEMADEX) 20 MG tablet Take 2 tablets (40 mg total) by mouth 2 (two) times daily. 02/24/17  Yes Larey Dresser, MD   warfarin (COUMADIN) 5 MG tablet TAKE 1 TABLET TO 1 AND 1/2 TABLETS BY MOUTH EVERY DAY AS DIRECTED BY COUMADIN CLINIC Patient taking differently: Take 1 and 1/2 tablets (7.5mg ) on Mondays and Fridays. Take 1 tablet (5mg ) once daily on every other day 01/27/17  Yes [name], MD  oxyCODONE-acetaminophen (PERCOCET/ROXICET) 5-325 MG tablet Take 2 tablets by mouth every 4 (four) hours as needed for severe pain. Patient not taking: Reported on 04/07/2017 03/15/17   Long, Wonda Olds, MD   Dg Tibia/fibula Left  Result Date: 04/07/2017 CLINICAL DATA:  Cellulitis and open wounds on both legs. EXAM: LEFT TIBIA AND FIBULA - 2 VIEW COMPARISON:  Left foot 03/15/2017.  Left knee 01/07/2016 FINDINGS: No evidence of acute fracture or dislocation of the left tibia or fibula. No focal bone lesion or bone destruction. Bone cortex appears intact. Soft tissue calcifications consistent with vascular calcifications and phleboliths. Infiltration in the subcutaneous fat likely to represent edema or cellulitis. No soft tissue gas collections. No radiopaque foreign bodies. IMPRESSION: No acute bony abnormalities. Subcutaneous soft tissue infiltration likely represent cellulitis or edema. Electronically Signed   By: Lucienne Capers M.D.   On: 04/07/2017 02:33   Dg Tibia/fibula Right  Result Date: 04/07/2017 CLINICAL DATA:  Cellulitis and open wounds on both legs. EXAM: RIGHT TIBIA AND FIBULA - 2 VIEW COMPARISON:  Right foot 03/21/2017.  Right knee 08/11/2016. FINDINGS: No evidence of acute fracture or dislocation of the right tibia or fibula. No focal bone lesion or bone destruction. Bone cortex appears intact without erosion. Soft tissue calcifications consistent with phleboliths and vascular calcifications. Infiltration of the subcutaneous fat likely to represent edema or cellulitis. No radiopaque soft tissue foreign bodies or gas collections. Soft tissue defect along the distal lateral lower leg consistent with history of  ulceration. IMPRESSION: No acute bony abnormalities. Infiltration in the subcutaneous fatty tissues consistent with cellulitis or edema. Electronically Signed   By: Lucienne Capers M.D.   On: 04/07/2017 02:34   Dg Chest Port 1 View  Result Date: 04/07/2017 CLINICAL DATA:  Intubation. EXAM: PORTABLE CHEST 1 VIEW COMPARISON:  04/06/2017 . FINDINGS: Surgical  clips left neck. Endotracheal tube, NG tube in stable position. Cardiomegaly with normal pulmonary vascularity. Low lung volumes with mild basilar atelectasis. Tiny left pleural effusion cannot be excluded. No pneumothorax. IMPRESSION: 1. Lines and tubes in stable position. 2. Low lung volumes with mild basilar atelectasis. Tiny left pleural effusion cannot be excluded. Electronically Signed   By: Marcello Moores  Register   On: 04/07/2017 06:34   Dg Chest Portable 1 View  Result Date: 04/06/2017 CLINICAL DATA:  Endotracheal tube and orogastric tube placement. Initial encounter. EXAM: PORTABLE CHEST 1 VIEW COMPARISON:  Chest radiograph performed 04/06/2017 FINDINGS: The patient's endotracheal tube appears to end at the carina. This should be retracted 3 cm. The patient's enteric tube is seen ending overlying the body of the stomach. There is mild elevation of the left hemidiaphragm. Mild vascular congestion is noted. Mild bibasilar atelectasis seen. No pleural effusion or pneumothorax is seen. The cardiomediastinal silhouette is mildly enlarged. No acute osseous abnormalities are identified. IMPRESSION: 1. Endotracheal tube appears to end at the carina. This should be retracted 3 cm. 2. Enteric tube noted ending overlying the body of the stomach. 3. Mild elevation of the left hemidiaphragm. Mild bibasilar atelectasis seen. 4. Mild vascular congestion and mild cardiomegaly noted. These results were called by telephone at the time of interpretation on 04/06/2017 at 7:44 pm to Dr. Carmin Muskrat, who verbally acknowledged these results. Electronically Signed   By:  Garald Balding M.D.   On: 04/06/2017 19:45   Dg Chest Port 1 View  Result Date: 04/06/2017 CLINICAL DATA:  56 y/o  F; possible medication overdose. EXAM: PORTABLE CHEST 1 VIEW COMPARISON:  56 y/o  F; 02/22/2016 chest radiograph. FINDINGS: Normal cardiac silhouette. Aortic atherosclerosis with calcification. Clear lungs. No pleural effusion or pneumothorax. Bones are unremarkable. IMPRESSION: No active disease. Electronically Signed   By: Kristine Garbe M.D.   On: 04/06/2017 16:38   Dg Toe 2nd Right  Result Date: 04/06/2017 CLINICAL DATA:  Right second toe open wound. Clinical concern for osteomyelitis. EXAM: RIGHT SECOND TOE COMPARISON:  Right foot dated 03/21/2017. FINDINGS: PA and 2 oblique views of the right second toe were obtained. There is no lateral view. The patient was not able to cooperate for a true lateral view. There is soft tissue irregularity along the medial aspect of the third toe. No fracture, dislocation, bone erosion, periosteal reaction or soft tissue gas seen. IMPRESSION: No radiographic evidence of osteomyelitis. Electronically Signed   By: Claudie Revering M.D.   On: 04/06/2017 18:15   - pertinent xrays, CT, MRI studies were reviewed and independently interpreted  Positive ROS: All other systems have been reviewed and were otherwise negative with the exception of those mentioned in the HPI and as above.  Physical Exam: General: Alert, no acute distress Psychiatric: Patient is competent for consent with normal mood and affect Lymphatic: No axillary or cervical lymphadenopathy Cardiovascular: No pedal edema Respiratory: No cyanosis, no use of accessory musculature GI: No organomegaly, abdomen is soft and non-tender  Skin: Examination patient has mixed arterial venous insufficiency with ulceration to both legs. There is dermatitis in both legs. She has small ischemic ulcers on the left foot which are not painful. Right foot she has dry gangrenous changes of the second  toe.   Neurologic: Patient does not have protective sensation bilateral lower extremities.   MUSCULOSKELETAL:  Examination she does not have palpable pulses bilaterally she does not have protective sensation bilaterally she has exposed bone with dry gangrenous changes of the second  toe right foot. There is superficial ulcers on both legs with mixed arterial venous insufficiency.  Assessment: Assessment: Diabetic insensate neuropathy with severe peripheral vascular disease with mixed arterial venous insufficiency ulcers both legs with dry gangrene right foot second toe.  Plan: Recommend consult Dr. Gwenlyn Found for vascular workup to see if her vascular status can be improved for the right lower extremity or if she has sufficient circulation to heal an amputation of the second toe. At this point I do not feel there is sufficient circulation to heal an amputation of the second toe. I will be available for surgical intervention for the right foot once we better understand her vascular status.  Thank you for the consult and the opportunity to see [name], [place] [phone] 6:42 PM

## 2017-04-07 NOTE — Consult Note (Addendum)
[facility] Nurse wound consult note Reason for Consult: toe wound Right second toe Wound type: unstageable Pressure Injury POA: n/a Measurement:Right second toe 2.5cm x 1.75cm x 0.1cm, 100% necrotic black and yellow, unstageable, scant drainage. Left great toe near nail bed, 1.25cm x 0.75cm unstageable 100% black no drainage Left great toe at base on dorsal side 0.75cm x 1cm  unstageable,  100% dry eschar, no drainage. Right pretibial with red and peeling skin, no actual wound, weeping copiously. Left medial calf 2.5cm x 1.75cm x 0.1cm 100% yellow wet slough, unstageable.  Left lateral calf red and weepy with 4 wounds, largest is 1.25cm x 1cm, all 100% yellow slough, unstageable. Wound bed: see above Drainage (amount, consistency, odor) see above Periwound: red, peeling, weeping to leg wounds, Right 2nd toe is discolored, however it blanches <3 sec. Dressing procedure/placement/frequency: Due to extent of Right 2nd toe wound an Ortho consult is recommended.(I realize that xray did not show osteo.) Orders written for leg wounds (to be done after ortho consult) wounds just covered at present so MD can see. Will use Aquacell Ag+ hydrofiber on leg wounds. Wound team will watch for ortho's consult and plan from there.    Fara Olden, RN-C, WTA-C Wound Treatment Associate

## 2017-04-07 NOTE — Procedures (Signed)
Extubation Procedure Note  Patient Details:   Name: [name] DOB: Sep 05, 1961 MRN: [id number]   Airway Documentation:     Evaluation  O2 sats: stable throughout Complications: No apparent complications Patient did tolerate procedure well. Bilateral Breath Sounds: Clear   Yes   Pt. Was extubated to a 3L [place] without any complications, dyspnea or stridor noted. Pt. Was instructed on IS x 5, highest goal achieved was 1500 mL.  Virdell Hoiland, Eddie [place] 04/07/2017, 10:59 AM

## 2017-04-07 NOTE — Progress Notes (Signed)
PULMONARY / CRITICAL CARE MEDICINE   Name: [name] MRN: [id number] DOB: 05/20/1961    ADMISSION DATE:  04/06/2017  REFERRING MD:  Dr. Vanita Panda EDP  CHIEF COMPLAINT:  AMS  HISTORY OF PRESENT ILLNESS:   55 yo female smoker with altered mental status, and had c/o leg pains likely from diabetic foot.  Intubated for airway protection.  PMHx of PAF, DM, ETOH with cirrhosis, combined CHF, polysubstance abuse, COPD, CAD.  SUBJECTIVE:  More alert.  C/o leg pains.  VITAL SIGNS: BP 130/61   Pulse 91   Temp 99.8 F (37.7 C) (Oral)   Resp 17   Ht 5\' 3"  (1.6 m)   Wt 238 lb 5.1 oz (108.1 kg)   LMP 11/27/2012   SpO2 100%   BMI 42.22 kg/m   VENTILATOR SETTINGS: Vent Mode: PSV;CPAP FiO2 (%):  [40 %-60 %] 40 % Set Rate:  [14 bmp] 14 bmp Vt Set:  [420 mL] 420 mL PEEP:  [5 cmH20] 5 cmH20 Pressure Support:  [10 cmH20] 10 cmH20  INTAKE / OUTPUT: I/O last 3 completed shifts: In: 1707.4 [I.V.:1457.4; IV Piggyback:250] Out: 650 [Urine:650]  PHYSICAL EXAMINATION:  General - pleasant Eyes - pupils reactive ENT - ETT in place Cardiac - irregular, no murmur Chest - no wheeze, rales Abd - soft, non tender Ext - gangrenous toes on Rt foot Skin - erythema on b/l lower legs Neuro - normal strength Psych - normal mood  LABS:  BMET  Recent Labs Lab 04/06/17 1650 04/07/17 0703  NA 136 139  K 4.4 4.1  CL 99* 104  CO2 23 23  BUN 89* 81*  CREATININE 1.99* 1.80*  GLUCOSE 284* 327*    Electrolytes  Recent Labs Lab 04/06/17 1650 04/07/17 0703  CALCIUM 9.7 8.8*    CBC  Recent Labs Lab 04/06/17 1650 04/07/17 0703  WBC 34.9* 22.0*  HGB 11.7* 9.9*  HCT 35.7* 30.9*  PLT 373 296    Coag's  Recent Labs Lab 04/06/17 1650 04/07/17 0703  INR 2.24 2.16    Sepsis Markers  Recent Labs Lab 04/06/17 1704 04/06/17 2018 04/06/17 2325  LATICACIDVEN 2.80* 0.89  --   PROCALCITON  --   --  2.13    ABG  Recent Labs Lab 04/06/17 2017 04/07/17 0316  PHART  7.382 7.378  PCO2ART 45.6 43.9  PO2ART 116.0* 104    Liver Enzymes  Recent Labs Lab 04/06/17 1650  AST 29  ALT 33  ALKPHOS 161*  BILITOT 0.5  ALBUMIN 3.5    Cardiac Enzymes  Recent Labs Lab 04/06/17 2325  TROPONINI 0.06*    Glucose  Recent Labs Lab 04/06/17 2304 04/07/17 0346 04/07/17 0736  GLUCAP 301* 265* 351*    Imaging Dg Tibia/fibula Left  Result Date: 04/07/2017 CLINICAL DATA:  Cellulitis and open wounds on both legs. EXAM: LEFT TIBIA AND FIBULA - 2 VIEW COMPARISON:  Left foot 03/15/2017.  Left knee 01/07/2016 FINDINGS: No evidence of acute fracture or dislocation of the left tibia or fibula. No focal bone lesion or bone destruction. Bone cortex appears intact. Soft tissue calcifications consistent with vascular calcifications and phleboliths. Infiltration in the subcutaneous fat likely to represent edema or cellulitis. No soft tissue gas collections. No radiopaque foreign bodies. IMPRESSION: No acute bony abnormalities. Subcutaneous soft tissue infiltration likely represent cellulitis or edema. Electronically Signed   By: Lucienne Capers M.D.   On: 04/07/2017 02:33   Dg Tibia/fibula Right  Result Date: 04/07/2017 CLINICAL DATA:  Cellulitis and open wounds on  both legs. EXAM: RIGHT TIBIA AND FIBULA - 2 VIEW COMPARISON:  Right foot 03/21/2017.  Right knee 08/11/2016. FINDINGS: No evidence of acute fracture or dislocation of the right tibia or fibula. No focal bone lesion or bone destruction. Bone cortex appears intact without erosion. Soft tissue calcifications consistent with phleboliths and vascular calcifications. Infiltration of the subcutaneous fat likely to represent edema or cellulitis. No radiopaque soft tissue foreign bodies or gas collections. Soft tissue defect along the distal lateral lower leg consistent with history of ulceration. IMPRESSION: No acute bony abnormalities. Infiltration in the subcutaneous fatty tissues consistent with cellulitis or edema.  Electronically Signed   By: Lucienne Capers M.D.   On: 04/07/2017 02:34   Dg Chest Port 1 View  Result Date: 04/07/2017 CLINICAL DATA:  Intubation. EXAM: PORTABLE CHEST 1 VIEW COMPARISON:  04/06/2017 . FINDINGS: Surgical clips left neck. Endotracheal tube, NG tube in stable position. Cardiomegaly with normal pulmonary vascularity. Low lung volumes with mild basilar atelectasis. Tiny left pleural effusion cannot be excluded. No pneumothorax. IMPRESSION: 1. Lines and tubes in stable position. 2. Low lung volumes with mild basilar atelectasis. Tiny left pleural effusion cannot be excluded. Electronically Signed   By: Marcello Moores  Register   On: 04/07/2017 06:34   Dg Chest Portable 1 View  Result Date: 04/06/2017 CLINICAL DATA:  Endotracheal tube and orogastric tube placement. Initial encounter. EXAM: PORTABLE CHEST 1 VIEW COMPARISON:  Chest radiograph performed 04/06/2017 FINDINGS: The patient's endotracheal tube appears to end at the carina. This should be retracted 3 cm. The patient's enteric tube is seen ending overlying the body of the stomach. There is mild elevation of the left hemidiaphragm. Mild vascular congestion is noted. Mild bibasilar atelectasis seen. No pleural effusion or pneumothorax is seen. The cardiomediastinal silhouette is mildly enlarged. No acute osseous abnormalities are identified. IMPRESSION: 1. Endotracheal tube appears to end at the carina. This should be retracted 3 cm. 2. Enteric tube noted ending overlying the body of the stomach. 3. Mild elevation of the left hemidiaphragm. Mild bibasilar atelectasis seen. 4. Mild vascular congestion and mild cardiomegaly noted. These results were called by telephone at the time of interpretation on 04/06/2017 at 7:44 pm to Dr. Carmin Muskrat, who verbally acknowledged these results. Electronically Signed   By: Garald Balding M.D.   On: 04/06/2017 19:45   Dg Chest Port 1 View  Result Date: 04/06/2017 CLINICAL DATA:  56 y/o  F; possible  medication overdose. EXAM: PORTABLE CHEST 1 VIEW COMPARISON:  56 y/o  F; 02/22/2016 chest radiograph. FINDINGS: Normal cardiac silhouette. Aortic atherosclerosis with calcification. Clear lungs. No pleural effusion or pneumothorax. Bones are unremarkable. IMPRESSION: No active disease. Electronically Signed   By: Kristine Garbe M.D.   On: 04/06/2017 16:38   Dg Toe 2nd Right  Result Date: 04/06/2017 CLINICAL DATA:  Right second toe open wound. Clinical concern for osteomyelitis. EXAM: RIGHT SECOND TOE COMPARISON:  Right foot dated 03/21/2017. FINDINGS: PA and 2 oblique views of the right second toe were obtained. There is no lateral view. The patient was not able to cooperate for a true lateral view. There is soft tissue irregularity along the medial aspect of the third toe. No fracture, dislocation, bone erosion, periosteal reaction or soft tissue gas seen. IMPRESSION: No radiographic evidence of osteomyelitis. Electronically Signed   By: Claudie Revering M.D.   On: 04/06/2017 18:15     STUDIES:  PFT 02/22/16 >> FEV1 2.13 (76%), FEV1% 73, TLC 4.84 (94%), DLCO 49% Echo 11/17/16 >> EF 40  to 45%, grade 2 DD PSG 11/22/16 >> AHI 58, SpO2 low 62%, CPAP 19 cm H2O with 1 liter oxygen CT head 04/06/17 >>  CULTURES: Blood 5/24 > Urine 5/24 >  ANTIBIOTICS: Zosyn 5/24 > Vancomycin 5/24 > 5/24  SIGNIFICANT EVENTS: 5/24 Admit 5/25 Wound care, ortho consulted  LINES/TUBES: ETT 5/24 >> 5/25   ASSESSMENT / PLAN:  Acute hypoxic respiratory failure with compromised airway. Hx of COPD, OSA, OHS. - extubate 5/24 - Bipap qhs and prn - oxygen to keep SpO2 > 90% - continue pulmicort, duoneb  Severe sepsis 2nd to lower extremity cellulitis, UTI. - day 2 of Abx - f/u with wound care - consult ortho to assess Rt toes  Hypotension from sedation. - resolved  Hx of PAF on coumadin. Hx of chronic combined CHF, HTN, CAD, HLD. - heparin gtt per pharmacy - resume amiodarone, norvasc, lipitor,  zebeta, cozaar, aldactone, demadex  CKD 2. - monitor renal fx, urine outpt  Hx of ETOH with cirrhosis. - thiamine, folic acid, MVI  Anemia of critical illness and chronic disease. - f/u CBC  DM type II. - SSI  Acute metabolic encephalopathy. Neuropathic pain. - monitor mental status - hold outpt neurontin - prn fentanyl, tylenol - add cymbalta 5/25  DVT prophylaxis - heparin gtt SUP - protonix  Nutrition - carb modified diet Goals of care - Full code  CC time 32 minutes  Chesley Mires, MD [place] 04/07/2017, 10:50 AM Pager:  [phone] After 3pm call: [phone]

## 2017-04-07 NOTE — Progress Notes (Signed)
Initial Nutrition Assessment  DOCUMENTATION CODES:   Morbid obesity  INTERVENTION:   30 ml Prostat BID MVI daily Discussed DM guidelines during diet review but pt may benefit from further education prior to d/c  NUTRITION DIAGNOSIS:   Increased nutrient needs related to wound healing as evidenced by estimated needs.  GOAL:   Patient will meet greater than or equal to 90% of their needs  MONITOR:   PO intake, Skin, Labs  REASON FOR ASSESSMENT:   Ventilator    ASSESSMENT:   Pt with PMH of DM, alcoholic cirrhosis, CHF, polysubstance abuse, COPD admitted with AMS requiring intuabation. Pt with severe sepsis and has a UTI and wounds on both of her feet.    Pt extubated this am.  Spoke with pt who reports that she has had foot ulcers for some time. They feel like her feet are burning. She has been seen at multiple wound care centers. She reports poor blood glucose control at home. Her blood sugars run in the 300's. She lives with her sister and reports compliance with medications. She eats at least 2 meals per day. Breakfast is egg and toast, lunch is whatever she finds, dinner is baked chicken with sides.  She drinks mostly water. She is unsure why her blood sugar is not controlled.   Diet Order:  Diet Carb Modified Fluid consistency: Thin; Room service appropriate? Yes  Skin:  Wound (see comment) (multiple diabetic foot ulcers, cellulitis BLE)  Last BM:  5/25  Height:   Ht Readings from Last 1 Encounters:  04/06/17 5\' 3"  (1.6 m)    Weight:   Wt Readings from Last 1 Encounters:  04/07/17 238 lb 5.1 oz (108.1 kg)    Ideal Body Weight:  52.2 kg  BMI:  Body mass index is 42.22 kg/m.  Estimated Nutritional Needs:   Kcal:  1600-1800  Protein:  110-120 grams  Fluid:  >1.6 L/day  EDUCATION NEEDS:   Education needs no appropriate at this time  [place], [place], [place] Pager [phone] After Hours Pager

## 2017-04-07 NOTE — Progress Notes (Signed)
Spoke with Dr. Halford Chessman.  Due to patients short duration of intubation, verbal order given to me, to do a bedside swallow eval at this time.  Gave patient Ice chips and sips of water.  Patient had no issue with either of them.  No s/s of distress or aspiration.  Gave patient applesauce,  Patient also had no issues with that.  Will place order for clear liquid diet.

## 2017-04-08 DIAGNOSIS — I96 Gangrene, not elsewhere classified: Secondary | ICD-10-CM

## 2017-04-08 DIAGNOSIS — I998 Other disorder of circulatory system: Secondary | ICD-10-CM

## 2017-04-08 LAB — GLUCOSE, CAPILLARY
Glucose-Capillary: 281 mg/dL — ABNORMAL HIGH (ref 65–99)
Glucose-Capillary: 365 mg/dL — ABNORMAL HIGH (ref 65–99)
Glucose-Capillary: 307 mg/dL — ABNORMAL HIGH (ref 65–99)
Glucose-Capillary: 284 mg/dL — ABNORMAL HIGH (ref 65–99)

## 2017-04-08 LAB — BASIC METABOLIC PANEL
Anion gap: 9 (ref 5–15)
BUN: 52 mg/dL — AB (ref 6–20)
CHLORIDE: 100 mmol/L — AB (ref 101–111)
CO2: 26 mmol/L (ref 22–32)
Calcium: 8 mg/dL — ABNORMAL LOW (ref 8.9–10.3)
Creatinine, Ser: 1.4 mg/dL — ABNORMAL HIGH (ref 0.44–1.00)
GFR calc Af Amer: 48 mL/min — ABNORMAL LOW (ref >60)
GFR, EST NON AFRICAN AMERICAN: 42 mL/min — AB (ref >60)
GLUCOSE: 326 mg/dL — AB (ref 65–99)
POTASSIUM: 3.7 mmol/L (ref 3.5–5.1)
Sodium: 135 mmol/L (ref 135–145)

## 2017-04-08 LAB — CBC
HEMATOCRIT: 29.3 % — AB (ref 36.0–46.0)
Hemoglobin: 9.1 g/dL — ABNORMAL LOW (ref 12.0–15.0)
MCH: 29.4 pg (ref 26.0–34.0)
MCHC: 31.1 g/dL (ref 30.0–36.0)
MCV: 94.5 fL (ref 78.0–100.0)
PLATELETS: 282 10*3/uL (ref 150–400)
RBC: 3.1 MIL/uL — ABNORMAL LOW (ref 3.87–5.11)
RDW: 15.8 % — AB (ref 11.5–15.5)
WBC: 18.9 10*3/uL — ABNORMAL HIGH (ref 4.0–10.5)

## 2017-04-08 LAB — URINE CULTURE: Special Requests: NORMAL

## 2017-04-08 LAB — PROTIME-INR
INR: 2.36
Prothrombin Time: 26.2 seconds — ABNORMAL HIGH (ref 11.4–15.2)

## 2017-04-08 MED ORDER — INSULIN GLARGINE 100 UNIT/ML ~~LOC~~ SOLN
28.0000 [IU] | Freq: Every day | SUBCUTANEOUS | Status: DC
  Administered 2017-04-08 – 2017-04-10 (×3): 28 [IU] via SUBCUTANEOUS
  Filled 2017-04-08 (×3): qty 0.28

## 2017-04-08 MED ORDER — IPRATROPIUM-ALBUTEROL 0.5-2.5 (3) MG/3ML IN SOLN
3.0000 mL | Freq: Three times a day (TID) | RESPIRATORY_TRACT | Status: DC
  Administered 2017-04-08: 3 mL via RESPIRATORY_TRACT
  Filled 2017-04-08: qty 3

## 2017-04-08 MED ORDER — DIPHENHYDRAMINE HCL 25 MG PO CAPS
25.0000 mg | ORAL_CAPSULE | Freq: Two times a day (BID) | ORAL | Status: DC | PRN
  Administered 2017-04-08 – 2017-04-14 (×9): 25 mg via ORAL
  Filled 2017-04-08 (×10): qty 1

## 2017-04-08 MED ORDER — HYDROCODONE-ACETAMINOPHEN 7.5-325 MG PO TABS
1.0000 | ORAL_TABLET | ORAL | Status: DC | PRN
  Administered 2017-04-08 – 2017-04-14 (×30): 1 via ORAL
  Filled 2017-04-08 (×31): qty 1

## 2017-04-08 MED ORDER — ONDANSETRON HCL 4 MG/2ML IJ SOLN
4.0000 mg | Freq: Three times a day (TID) | INTRAMUSCULAR | Status: DC | PRN
  Administered 2017-04-08 – 2017-04-12 (×4): 4 mg via INTRAVENOUS
  Filled 2017-04-08 (×4): qty 2

## 2017-04-08 NOTE — Progress Notes (Addendum)
PULMONARY / CRITICAL CARE MEDICINE   Name: [name] MRN: [id number] DOB: 1961-09-05    ADMISSION DATE:  04/06/2017  REFERRING MD:  Dr. Vanita Panda EDP  CHIEF COMPLAINT:  AMS  HISTORY OF PRESENT ILLNESS:   56 yo female smoker with altered mental status, and had c/o leg pains likely from diabetic foot.  Intubated for airway protection.  PMHx of PAF, DM, ETOH with cirrhosis, combined CHF, polysubstance abuse, COPD, CAD.  SUBJECTIVE:  More alert.  C/o leg pains. No new complaints. Wants to get out of the bed.   VITAL SIGNS: BP 100/64   Pulse 64   Temp 97.8 F (36.6 C) (Oral)   Resp 14   Ht 5\' 3"  (1.6 m)   Wt 241 lb 6.5 oz (109.5 kg)   LMP 11/27/2012   SpO2 100%   BMI 42.76 kg/m   VENTILATOR SETTINGS: FiO2 (%):  [40 %] 40 %  INTAKE / OUTPUT: I/O last 3 completed shifts: In: 1443 [P.O.:300; I.V.:4235; IV Piggyback:400] Out: 5200 [Urine:5200]  PHYSICAL EXAMINATION:  General - pleasant Eyes - pupils reactive ENT - ETT in place Cardiac - irregular, no murmur Chest - no wheeze, rales Abd - soft, non tender Ext - gangrenous toes on Rt foot Skin - erythema on b/l lower legs Neuro - normal strength Psych - normal mood  LABS:  BMET  Recent Labs Lab 04/06/17 1650 04/07/17 0703 04/08/17 0135  NA 136 139 135  K 4.4 4.1 3.7  CL 99* 104 100*  CO2 23 23 26   BUN 89* 81* 52*  CREATININE 1.99* 1.80* 1.40*  GLUCOSE 284* 327* 326*    Electrolytes  Recent Labs Lab 04/06/17 1650 04/07/17 0703 04/08/17 0135  CALCIUM 9.7 8.8* 8.0*    CBC  Recent Labs Lab 04/06/17 1650 04/07/17 0703 04/08/17 0135  WBC 34.9* 22.0* 18.9*  HGB 11.7* 9.9* 9.1*  HCT 35.7* 30.9* 29.3*  PLT 373 296 282    Coag's  Recent Labs Lab 04/07/17 0703 04/07/17 1842 04/08/17 0135  INR 2.16 2.33 2.36    Sepsis Markers  Recent Labs Lab 04/06/17 1704 04/06/17 2018 04/06/17 2325  LATICACIDVEN 2.80* 0.89  --   PROCALCITON  --   --  2.13    ABG  Recent Labs Lab  04/06/17 2017 04/07/17 0316  PHART 7.382 7.378  PCO2ART 45.6 43.9  PO2ART 116.0* 104    Liver Enzymes  Recent Labs Lab 04/06/17 1650  AST 29  ALT 33  ALKPHOS 161*  BILITOT 0.5  ALBUMIN 3.5    Cardiac Enzymes  Recent Labs Lab 04/06/17 2325  TROPONINI 0.06*    Glucose  Recent Labs Lab 04/07/17 0346 04/07/17 0736 04/07/17 1253 04/07/17 1628 04/07/17 2254 04/08/17 0735  GLUCAP 265* 351* 373* 240* 310* 281*    Imaging No results found.   STUDIES:  PFT 02/22/16 >> FEV1 2.13 (76%), FEV1% 73, TLC 4.84 (94%), DLCO 49% Echo 11/17/16 >> EF 40 to 45%, grade 2 DD PSG 11/22/16 >> AHI 58, SpO2 low 62%, CPAP 19 cm H2O with 1 liter oxygen CT head 04/06/17 >>  CULTURES: Blood 5/24 > Urine 5/24 >Ecoli   ANTIBIOTICS: Zosyn 5/24 > Vancomycin 5/24 > 5/24  SIGNIFICANT EVENTS: 5/24 Admit, extubated 5/25 Wound care, ortho consulted 5/26 Vascular consulted.  LINES/TUBES: ETT 5/24 >> 5/25   ASSESSMENT / PLAN:  Acute hypoxic respiratory failure with compromised airway. Hx of COPD, OSA, OHS. - extubate 5/24 - Bipap qhs and prn - oxygen to keep SpO2 > 90% -  continue pulmicort, duoneb - transfer to floors - telemetry.  Severe sepsis 2nd to lower extremity cellulitis, UTI. - day 3 of Abx - f/u with wound care - ortho consulted ; the recommend vascular consult which has been called today  Hypotension from sedation. - resolved  Hx of PAF on coumadin. Hx of chronic combined CHF, HTN, CAD, HLD. - heparin gtt per pharmacy - resume amiodarone, norvasc, lipitor, zebeta, cozaar, aldactone, demadex  CKD 2. - monitor renal fx, urine outpt  Hx of ETOH with cirrhosis. - thiamine, folic acid, MVI  Anemia of critical illness and chronic disease. - f/u CBC  DM type II. - SSI, increase lantus since BS running high.   Acute metabolic encephalopathy. Neuropathic pain. - monitor mental status - hold outpt neurontin - prn fentanyl, tylenol - add cymbalta  5/25  DVT prophylaxis - heparin gtt SUP - protonix  Nutrition - carb modified diet Goals of care - Full code  Sharia Reeve, MD [place] intensivist covering for [facility]  04/08/2017, 7:58 AM Pager:  [phone] After 3pm call: [phone]

## 2017-04-08 NOTE — Consult Note (Signed)
[facility] [place]  Patient name: [name] MRN: [id number] DOB: 1961/07/05 Sex: female    HPI: [name] is a 56 y.o. female in consultation for right leg arterial insufficiency. She has an extremely complex past history as outlined below. She had undergone prior treatment of right leg ischemia with Dr. Gwenlyn Found.  She had undergone right SFA angioplasty several years ago and had recurrence and in March 2016 underwent atherectomy of her right superficial femoral artery and drug-eluting balloon. At that time she had a diseased single-vessel peroneal runoff. She has had the several problems with her lower from these. She does have marked lower extremity edema and superficial ulcerations related to the edema. 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 Dr. Sharol Given was consulted yesterday. He was concern regarding adequacy of flow for foot surgery and recommended reconsultation of Dr. Gwenlyn Found for vascular workup. Apparently Dr. Gwenlyn Found is not available and we are called for assistance.  Past Medical History:  Diagnosis Date  . Alcohol abuse   . Alcoholic cirrhosis ([name])   . Alcoholic cirrhosis ([place])   . Anemia   . Anxiety   . B12 deficiency   . CAD (coronary artery disease)    a. cath 11/10/2014 LM nl, LAD min irregs, D1 30 ost, D2 50d, LCX 52m, OM1 80 p/m (1.5 mm vessel), OM2 59m, RCA nondom 94m-->med rx.. Demand ischemia in the setting of rapid a-fib.  . Cardiomyopathy ([place])   . Carotid artery disease ([employer])    a. 01/2015 Carotid Angio: RICA 740, LICA 81K. s/p L carotid endarterectomy 02/2015.  [name] Chronic combined systolic and diastolic CHF (congestive heart failure) ([place])    a. Last echo 12/205: EF 40-45%, inferoapical/posterior HK, not technically sufficient to allow eval of LV diastolic function, normal LA in size..  . Cocaine abuse   . COPD (chronic obstructive pulmonary disease) ([place])   .  Critical lower limb ischemia   . Diabetic peripheral neuropathy ([place])   . Elevated troponin    a. Chronic elevation.  [name] GERD (gastroesophageal reflux disease)   . Hyperlipemia   . Hypertension   . Hypokalemia   . Hypomagnesemia   . Hypothyroidism   . Marijuana abuse   . Narcotic abuse   . Noncompliance   . NSVT (nonsustained ventricular tachycardia) ([place])   . Obesity   . PAF (paroxysmal atrial fibrillation) ([place])   . Paroxysmal atrial tachycardia ([place])   . Paroxysmal atrial tachycardia ([place])   . Peripheral arterial disease ([place])    a. 01/2015 Angio/PTA: LSFA 100 w/ recon @ adductor canal and 1 vessel runoff via AT, RSFA 99 (atherectomy/pta) - 1 vessel runoff via diff dzs peroneal.  . Poorly controlled type 2 diabetes mellitus ([place])   . Renal insufficiency    a. 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  [name] Symptomatic bradycardia    avoid AV blocking agent per EP  . Tobacco abuse     Family History  Problem Relation Age of Onset  . Hypertension Mother   . Diabetes Mother   . Cancer Mother        breast, ovarian, colon  . Clotting disorder Mother   . Heart disease Mother   . Heart attack Mother   . Breast cancer Mother   . Hypertension Father   . Heart disease Father   . Emphysema Sister        smoked    SOCIAL HISTORY: Social History  Substance Use Topics  . Smoking status: Current Some Day Smoker    Packs/day: 0.00    Years: 40.00    Types: E-cigarettes    Last attempt to quit: 11/03/2015  . Smokeless tobacco: Former Systems developer    Types: Snuff  . Alcohol use No     Comment: former alcohol abuse    Allergies  Allergen Reactions  . Gabapentin Nausea And Vomiting    POSSIBLE SHAKING? TAKING MED CURRENTLY    Current Facility-Administered Medications  Medication Dose Route Frequency Provider Last Rate Last Dose  . 0.9 %  sodium chloride infusion   Intravenous Continuous Carmin Muskrat, MD 125 mL/hr at 04/08/17 1000    . acetaminophen (TYLENOL) tablet 650 mg  650 mg Oral Q6H PRN  Chesley Mires, MD   650 mg at 04/07/17 1144  . amiodarone (PACERONE) tablet 100 mg  100 mg Oral Daily Chesley Mires, MD   100 mg at 04/08/17 0908  . amLODipine (NORVASC) tablet 10 mg  10 mg Oral Daily Chesley Mires, MD   10 mg at 04/07/17 1146  . atorvastatin (LIPITOR) tablet 40 mg  40 mg Oral q1800 Chesley Mires, MD   40 mg at 04/07/17 1809  . bisoprolol (ZEBETA) tablet 2.5 mg  2.5 mg Oral Daily Chesley Mires, MD   2.5 mg at 04/08/17 0909  . budesonide (PULMICORT) nebulizer solution 0.5 mg  0.5 mg Nebulization BID Corey Harold, NP   0.5 mg at 04/08/17 0804  . cholecalciferol (VITAMIN D) tablet 1,000 Units  1,000 Units Oral Daily Chesley Mires, MD   1,000 Units at 04/08/17 0909  . DULoxetine (CYMBALTA) DR capsule 20 mg  20 mg Oral Daily Chesley Mires, MD   20 mg at 04/08/17 0908  . feeding supplement (PRO-STAT SUGAR FREE 64) liquid 30 mL  30 mL Oral BID Chesley Mires, MD      . folic acid (FOLVITE) tablet 1 mg  1 mg Oral Daily Chesley Mires, MD   1 mg at 04/08/17 0908  . HYDROcodone-acetaminophen (NORCO) 7.5-325 MG per tablet 1 tablet  1 tablet Oral Q4H PRN Mauri Brooklyn, MD   1 tablet at 04/08/17 1120  . insulin aspart (novoLOG) injection 0-20 Units  0-20 Units Subcutaneous TID WC Chesley Mires, MD   11 Units at 04/08/17 0743  . insulin aspart (novoLOG) injection 0-5 Units  0-5 Units Subcutaneous QHS Chesley Mires, MD   4 Units at 04/07/17 2259  . insulin glargine (LANTUS) injection 28 Units  28 Units Subcutaneous Daily Sharia Reeve, MD   28 Units at 04/08/17 0908  . ipratropium-albuterol (DUONEB) 0.5-2.5 (3) MG/3ML nebulizer solution 3 mL  3 mL Nebulization TID Chesley Mires, MD   3 mL at 04/08/17 0804  . levothyroxine (SYNTHROID, LEVOTHROID) tablet 50 mcg  50 mcg Oral Daily Chesley Mires, MD   50 mcg at 04/08/17 0734  . losartan (COZAAR) tablet 25 mg  25 mg Oral Daily Chesley Mires, MD   25 mg at 04/08/17 0908  . multivitamin with minerals tablet 1 tablet  1 tablet Oral Daily Chesley Mires, MD   1 tablet at  04/08/17 0908  . pantoprazole (PROTONIX) EC tablet 40 mg  40 mg Oral Q1200 Chesley Mires, MD   40 mg at 04/07/17 1253  . piperacillin-tazobactam (ZOSYN) IVPB 3.375 g  3.375 g Intravenous Q8H Kara Mead V, MD 12.5 mL/hr at 04/08/17 0734 3.375 g at 04/08/17 0734  . spironolactone (ALDACTONE) tablet 25 mg  25 mg Oral Daily Chesley Mires, MD  25 mg at 04/08/17 0908  . torsemide (DEMADEX) tablet 40 mg  40 mg Oral BID Chesley Mires, MD   40 mg at 04/08/17 0734  . vancomycin (VANCOCIN) 1,250 mg in sodium chloride 0.9 % 250 mL IVPB  1,250 mg Intravenous Q24H Rigoberto Noel, MD   Stopped at 04/07/17 1939  . vitamin B-12 (CYANOCOBALAMIN) tablet 1,000 mcg  1,000 mcg Oral Daily Chesley Mires, MD   1,000 mcg at 04/08/17 0908    REVIEW OF SYSTEMS:  [X]  denotes positive finding, [ ]  denotes negative finding Cardiac  Comments:  Chest pain or chest pressure:    Shortness of breath upon exertion:    Short of breath when lying flat:    Irregular heart rhythm:        Vascular    Pain in calf, thigh, or hip brought on by ambulation: x   Pain in feet at night that wakes you up from your sleep:  x   Blood clot in your veins:    Leg swelling:  x         PHYSICAL EXAM: Vitals:   04/08/17 0806 04/08/17 0900 04/08/17 1000 04/08/17 1041  BP:    129/62  Pulse:  79 85 84  Resp:  17 14 16   Temp:    98.3 F (36.8 C)  TempSrc:    Oral  SpO2: 100% 100% 100% 98%  Weight:      Height:        GENERAL: The patient is a female, in no acute distress. Morbidly obese sitting in a chair. The vital signs are documented above. CARDIOVASCULAR: No palpable pedal pulses bilaterally PULMONARY: There is good air exchange  MUSCULOSKELETAL: There are no major deformities or cyanosis. NEUROLOGIC: No focal weakness or paresthesias are detected. SKIN: Marked pitting edema and excoriation of skin with serous fluid weeping from her wounds on both lower extremities from mid calf to ankle. She does have dry gangrene of her right  second toe with exposed bone. PSYCHIATRIC: The patient has a normal affect.  DATA:  Will obtain noninvasive lower extremity studies  MEDICAL ISSUES: Chronic ongoing ischemia of right lower extremity. No acute changes with dry gangrene of her second toe. Will need repeat arteriography to determine if revascularization is possible. Will defer this to Dr. Gwenlyn Found since she is an established patient with him and there are no acute changes. Explained that this would not happen until next week since this is a holiday weekend and elective cases are not being done on Monday. Will not follow actively.    Rosetta Posner, MD FACS [facility] Tel [phone] Pager [phone]

## 2017-04-08 NOTE — Progress Notes (Signed)
[place] for Heparin when INR <2 Indication: atrial fibrillation  Patient Measurements: Height: 5\' 3"  (160 cm) Weight: 241 lb 6.5 oz (109.5 kg) IBW/kg (Calculated) : 52.4 Heparin Dosing Weight: 80.7kg  Vital Signs: Temp: 98.3 F (36.8 C) (05/26 1041) Temp Source: Oral (05/26 1041) BP: 129/62 (05/26 1041) Pulse Rate: 84 (05/26 1041)  Labs:  Recent Labs  04/06/17 1650 04/06/17 2325 04/07/17 0703 04/07/17 1842 04/08/17 0135  HGB 11.7*  --  9.9*  --  9.1*  HCT 35.7*  --  30.9*  --  29.3*  PLT 373  --  296  --  282  LABPROT 25.2*  --  24.4* 26.0* 26.2*  INR 2.24  --  2.16 2.33 2.36  CREATININE 1.99*  --  1.80*  --  1.40*  TROPONINI  --  0.06*  --   --   --     Estimated Creatinine Clearance: 54.5 mL/min (A) (by C-G formula based on SCr of 1.4 mg/dL (H)).   Assessment: 37 YOF on warfarin PTA for hx Afib. INR 2.24 on admit - warfarin held for possible VVS/ortho interventions on ischemic R-foot. Pharmacy consulted to start Heparin bridge  when INR <2.   INR today remains elevated at 2.36 and trending up despite holding dosing. Will continue to hold the start of the heparin drip. Hgb/Hct low but stable.   Goal of Therapy:  Heparin level 0.3-0.7 units/ml Monitor platelets by anticoagulation protocol: Yes   Plan:  1. Warfarin on hold 2. No Heparin drip today since INR still >2 3. Will continue to monitor for any signs/symptoms of bleeding and will follow up with PT/INR in the a.m.   Thank you for allowing pharmacy to be a part of this patient's care.  Alycia Rossetti, PharmD, BCPS Clinical Pharmacist Pager: [phone] Clinical phone for 04/08/2017 from 7a-3:30p: [phone] If after 3:30p, please call main pharmacy at: [phone] 04/08/2017 12:32 PM

## 2017-04-08 NOTE — Progress Notes (Signed)
Patient could not tolerate BIPAP.  Wore for about 30 seconds before stating, "I cant.  I just cant."  Returned to nasal cannula 3L.

## 2017-04-09 LAB — GLUCOSE, CAPILLARY
GLUCOSE-CAPILLARY: 272 mg/dL — AB (ref 65–99)
Glucose-Capillary: 250 mg/dL — ABNORMAL HIGH (ref 65–99)
Glucose-Capillary: 496 mg/dL — ABNORMAL HIGH (ref 65–99)
Glucose-Capillary: 252 mg/dL — ABNORMAL HIGH (ref 65–99)

## 2017-04-09 LAB — CBC
HEMATOCRIT: 30.9 % — AB (ref 36.0–46.0)
HEMOGLOBIN: 9.7 g/dL — AB (ref 12.0–15.0)
MCH: 29.3 pg (ref 26.0–34.0)
MCHC: 31.4 g/dL (ref 30.0–36.0)
MCV: 93.4 fL (ref 78.0–100.0)
Platelets: 308 10*3/uL (ref 150–400)
RBC: 3.31 MIL/uL — ABNORMAL LOW (ref 3.87–5.11)
RDW: 15.1 % (ref 11.5–15.5)
WBC: 13.6 10*3/uL — ABNORMAL HIGH (ref 4.0–10.5)

## 2017-04-09 LAB — PROTIME-INR
INR: 1.75
PROTHROMBIN TIME: 20.6 s — AB (ref 11.4–15.2)

## 2017-04-09 LAB — BASIC METABOLIC PANEL
ANION GAP: 8 (ref 5–15)
BUN: 45 mg/dL — ABNORMAL HIGH (ref 6–20)
CALCIUM: 8.1 mg/dL — AB (ref 8.9–10.3)
CHLORIDE: 98 mmol/L — AB (ref 101–111)
CO2: 30 mmol/L (ref 22–32)
Creatinine, Ser: 1.54 mg/dL — ABNORMAL HIGH (ref 0.44–1.00)
GFR calc Af Amer: 43 mL/min — ABNORMAL LOW (ref >60)
GFR calc non Af Amer: 37 mL/min — ABNORMAL LOW (ref >60)
Glucose, Bld: 261 mg/dL — ABNORMAL HIGH (ref 65–99)
POTASSIUM: 4.4 mmol/L (ref 3.5–5.1)
Sodium: 136 mmol/L (ref 135–145)

## 2017-04-09 LAB — HEPARIN LEVEL (UNFRACTIONATED): HEPARIN UNFRACTIONATED: 0.16 [IU]/mL — AB (ref 0.30–0.70)

## 2017-04-09 MED ORDER — METRONIDAZOLE IN NACL 5-0.79 MG/ML-% IV SOLN
500.0000 mg | Freq: Three times a day (TID) | INTRAVENOUS | Status: DC
  Administered 2017-04-09 – 2017-04-14 (×14): 500 mg via INTRAVENOUS
  Filled 2017-04-09 (×15): qty 100

## 2017-04-09 MED ORDER — INSULIN ASPART 100 UNIT/ML ~~LOC~~ SOLN
7.0000 [IU] | Freq: Three times a day (TID) | SUBCUTANEOUS | Status: DC
  Administered 2017-04-09 – 2017-04-11 (×6): 7 [IU] via SUBCUTANEOUS

## 2017-04-09 MED ORDER — DEXTROSE 5 % IV SOLN
1.0000 g | INTRAVENOUS | Status: DC
  Administered 2017-04-09 – 2017-04-13 (×5): 1 g via INTRAVENOUS
  Filled 2017-04-09 (×7): qty 10

## 2017-04-09 MED ORDER — HEPARIN (PORCINE) IN NACL 100-0.45 UNIT/ML-% IJ SOLN
1350.0000 [IU]/h | INTRAMUSCULAR | Status: DC
  Administered 2017-04-09: 1200 [IU]/h via INTRAVENOUS
  Filled 2017-04-09 (×2): qty 250

## 2017-04-09 NOTE — Progress Notes (Signed)
Pt refuses cpap

## 2017-04-09 NOTE — Progress Notes (Addendum)
[place] for Heparin when INR <2 Indication: atrial fibrillation  Patient Measurements: Height: 5\' 3"  (160 cm) Weight: 254 lb 9.6 oz (115.5 kg) IBW/kg (Calculated) : 52.4 Heparin Dosing Weight: 80.7kg  Vital Signs: Temp: 98.3 F (36.8 C) (05/27 1606) BP: 119/59 (05/27 1606) Pulse Rate: 78 (05/27 1606)  Labs:  Recent Labs  04/06/17 2325  04/07/17 0703 04/07/17 1842 04/08/17 0135 04/09/17 0525 04/09/17 1823  HGB  --   < > 9.9*  --  9.1* 9.7*  --   HCT  --   --  30.9*  --  29.3* 30.9*  --   PLT  --   --  296  --  282 308  --   LABPROT  --   < > 24.4* 26.0* 26.2* 20.6*  --   INR  --   < > 2.16 2.33 2.36 1.75  --   HEPARINUNFRC  --   --   --   --   --   --  0.16*  CREATININE  --   --  1.80*  --  1.40* 1.54*  --   TROPONINI 0.06*  --   --   --   --   --   --   < > = values in this interval not displayed.  Estimated Creatinine Clearance: 51.2 mL/min (A) (by C-G formula based on SCr of 1.54 mg/dL (H)).   Assessment: 41 YOF on warfarin PTA for hx Afib. INR 2.24 on admit - warfarin held for possible VVS/ortho interventions on ischemic R-foot. Pharmacy consulted to start Heparin bridge. Initial heparin level is subtherapeutic at 0.16. No bleeding or other issues noted.   Goal of Therapy:  Heparin level 0.3-0.7 units/ml Monitor platelets by anticoagulation protocol: Yes   Plan:  Increase heparin gtt to 1800 units/hr (had been therapeutic on this rate in the past) Check an 8 hr heparin level Daily heparin level and CBC  Salome Arnt, PharmD, BCPS Pager # [phone] 04/09/2017 7:28 PM   ----------------------------------------------------------------------------------------------------------- Addendum  RN called to discuss loss of access earlier today. She stated access was loss on or before 1700 today and the heparin drip had been off. The heparin level drawn at 1830 reflects the drip being off for a period of time and is not  accurate. Will resume at the lower rate and recheck a HL in 6 hours  Plan 1. Decrease Heparin back down to 1200 units/hr (12 ml/hr) 2. Will continue to monitor for any signs/symptoms of bleeding and will follow up with heparin level in 6 hours   Thank you for allowing pharmacy to be a part of this patient's care.  Alycia Rossetti, PharmD, BCPS Clinical Pharmacist 04/09/2017 9:15 PM

## 2017-04-09 NOTE — Progress Notes (Signed)
[place] for Heparin when INR <2 Indication: atrial fibrillation  Patient Measurements: Height: 5\' 3"  (160 cm) Weight: 254 lb 9.6 oz (115.5 kg) IBW/kg (Calculated) : 52.4 Heparin Dosing Weight: 80.7kg  Vital Signs: Temp: 98.3 F (36.8 C) (05/27 0527) Temp Source: Oral (05/27 0527) BP: 120/60 (05/27 0527) Pulse Rate: 66 (05/27 0527)  Labs:  Recent Labs  04/06/17 2325 04/07/17 0703 04/07/17 1842 04/08/17 0135 04/09/17 0525  HGB  --  9.9*  --  9.1* 9.7*  HCT  --  30.9*  --  29.3* 30.9*  PLT  --  296  --  282 308  LABPROT  --  24.4* 26.0* 26.2* 20.6*  INR  --  2.16 2.33 2.36 1.75  CREATININE  --  1.80*  --  1.40* 1.54*  TROPONINI 0.06*  --   --   --   --     Estimated Creatinine Clearance: 51.2 mL/min (A) (by C-G formula based on SCr of 1.54 mg/dL (H)).   Assessment: 41 YOF on warfarin PTA for hx Afib. INR 2.24 on admit - warfarin held for possible VVS/ortho interventions on ischemic R-foot. Pharmacy consulted to start Heparin bridge  when INR <2.   INR today has trended down into the SUBtherapeutic range (INR 1.75 << 2.36). CBC low but stable. Will plan to start heparin drip today.   Goal of Therapy:  Heparin level 0.3-0.7 units/ml Monitor platelets by anticoagulation protocol: Yes   Plan:  1. Warfarin on hold 2. Start Heparin at a rate of 1200 units/hr (12 ml/hr) 3. Daily heparin level, CBC 4. Will continue to monitor for any signs/symptoms of bleeding and will follow up with heparin level in 6 hours   Thank you for allowing pharmacy to be a part of this patient's care.  Alycia Rossetti, PharmD, BCPS Clinical Pharmacist Pager: [phone] Clinical phone for 04/09/2017 from 7a-3:30p: [phone] If after 3:30p, please call main pharmacy at: [phone] 04/09/2017 10:28 AM

## 2017-04-09 NOTE — Progress Notes (Signed)
Patient ID: [name], female   DOB: [birth year], 56 y.o.   MRN: [id number]    PROGRESS NOTE  [name]  QPY:[id number] DOB: July 03, 1961 DOA: 04/06/2017  PCP: Sandi Mariscal, MD   Brief Narrative:  Pt is 56 yo female with known CA, CHF, COPD, continues to smoke, DM type II with complications of peripheral vascular disease and neuropathy, nephropathy, cocaine and THC abuse, presented with right foot pain and altered mental state. Per family member, she has taken multiple tablets of percocet at home as well. She was intubated from 5/24 - 5/25 due to concern for airway protection, initial labs should leukocytosis and lactic acidoses.   Assessment & Plan: Acute hypoxic respiratory failure in pt with hx of COPD, OSA, OHS, active smoker  - extubated 5/24, respiratory status remains stable   - keep on oxygen via [place] to maintain o2 sat > 92% - continue bronchodilators   Severe sepsis secondary to 2nd toe lower extremity cellulitis, UTI - day #3 of ABX,, was on vanc and zosyn, changed to Rocephin and flagyl on 5/27 based on urine culture reports  - ortho and vascular surgery team following  - WBC trending down - CBC in AM  Hypotension from sedation - resolved   PAF, on coumadin   - rate controlled for now  - heparin per pharmacy for now in an anticipation of an intervention vs surgery  - continue Amiodarone  Chronic combines CHF - stable volume status   HTN, HLD, CAD - keep on Norvasc, Zebeta, Cozaar, Aldactone, Demadex  - continue Lipitor   CKD stage II  - with Cr overall trending down - BMP in AM  DM type Ii with complications of nephropathy and PVD - keep on Lantus and SSI  Anemia of critical illness - Hg overall stable   Hx of alcohol and tobacco abuse - counseled on cessation   Hypothyroidism - continue synthroid   Acute metabolic encephalopathy - from oversedation, acute illness - now resolved   Morbid obesity  - Body mass index is 45.1 kg/m.  DVT  prophylaxis: heparin  Code Status: Full  Family Communication: Patient at bedside  Disposition Plan: to be determined   Consultants:   Vascular surgery  Ortho   STUDIES:   PFT 02/22/16 >> FEV1 2.13 (76%), FEV1% 73, TLC 4.84 (94%), DLCO 49%  Echo 11/17/16 >> EF 40 to 45%, grade 2 DD  PSG 11/22/16 >> AHI 58, SpO2 low 62%, CPAP 19 cm H2O with 1 liter oxygen  CT head 04/06/17 >>  CULTURES:  Blood 5/24 >  Urine 5/24 >  ANTIBIOTICS:  Zosyn 5/24 > 5/27  Vancomycin 5/24 > 5/24  Rocephin 5.27 >  Flagyl 5/27 >  Subjective: No events overnight.   Objective: Vitals:   04/09/17 0527 04/09/17 0914 04/09/17 1606 04/09/17 1959  BP: 120/60  (!) 119/59   Pulse: 66  78   Resp: 19  18   Temp: 98.3 F (36.8 C)  98.3 F (36.8 C)   TempSrc: Oral     SpO2: 95% 95% 96% 97%  Weight: 115.5 kg (254 lb 9.6 oz)     Height:        Intake/Output Summary (Last 24 hours) at 04/09/17 2017 Last data filed at 04/09/17 1800  Gross per 24 hour  Intake          1525.48 ml  Output                0 ml  Net  1525.48 ml   Filed Weights   04/07/17 0335 04/08/17 0500 04/09/17 0527  Weight: 108.1 kg (238 lb 5.1 oz) 109.5 kg (241 lb 6.5 oz) 115.5 kg (254 lb 9.6 oz)    Examination:  General exam: Appears calm and comfortable  Respiratory system: Clear to auscultation. Respiratory effort normal. Cardiovascular system: S1 & S2 heard, RRR. No JVD, murmurs, rubs, gallops or clicks.  Gastrointestinal system: Abdomen is nondistended, soft and nontender. No organomegaly or masses felt.  Central nervous system: Alert and oriented. No focal neurological deficits. Extremities: Bilateral pitting edema, wounds on both extremities from mid calf to ankles, dry gangrene on the right second toe with visible toe   Data Reviewed: I have personally reviewed following labs and imaging studies  CBC:  Recent Labs Lab 04/06/17 1650 04/07/17 0703 04/08/17 0135 04/09/17 0525  WBC 34.9* 22.0* 18.9*  13.6*  NEUTROABS 32.8*  --   --   --   HGB 11.7* 9.9* 9.1* 9.7*  HCT 35.7* 30.9* 29.3* 30.9*  MCV 92.2 95.4 94.5 93.4  PLT 373 296 282 623   Basic Metabolic Panel:  Recent Labs Lab 04/06/17 1650 04/07/17 0703 04/08/17 0135 04/09/17 0525  NA 136 139 135 136  K 4.4 4.1 3.7 4.4  CL 99* 104 100* 98*  CO2 23 23 26 30   GLUCOSE 284* 327* 326* 261*  BUN 89* 81* 52* 45*  CREATININE 1.99* 1.80* 1.40* 1.54*  CALCIUM 9.7 8.8* 8.0* 8.1*   Liver Function Tests:  Recent Labs Lab 04/06/17 1650  AST 29  ALT 33  ALKPHOS 161*  BILITOT 0.5  PROT 8.4*  ALBUMIN 3.5   Coagulation Profile:  Recent Labs Lab 04/06/17 1650 04/07/17 0703 04/07/17 1842 04/08/17 0135 04/09/17 0525  INR 2.24 2.16 2.33 2.36 1.75   Cardiac Enzymes:  Recent Labs Lab 04/06/17 2325  TROPONINI 0.06*   CBG:  Recent Labs Lab 04/08/17 1705 04/08/17 2048 04/09/17 0754 04/09/17 1212 04/09/17 1727  GLUCAP 307* 284* 250* 496* 252*   Urine analysis:    Component Value Date/Time   COLORURINE YELLOW 04/06/2017 1705   APPEARANCEUR HAZY (A) 04/06/2017 1705   LABSPEC 1.006 04/06/2017 1705   PHURINE 5.0 04/06/2017 1705   GLUCOSEU >=500 (A) 04/06/2017 1705   HGBUR SMALL (A) 04/06/2017 1705   BILIRUBINUR NEGATIVE 04/06/2017 [place] 04/06/2017 1705   PROTEINUR NEGATIVE 04/06/2017 1705   UROBILINOGEN 0.2 09/05/2015 0012   NITRITE POSITIVE (A) 04/06/2017 1705   LEUKOCYTESUR NEGATIVE 04/06/2017 1705   Recent Results (from the past 240 hour(s))  Urine culture     Status: Abnormal   Collection Time: 04/06/17  5:05 PM  Result Value Ref Range Status   Specimen Description URINE, RANDOM  Final   Special Requests unknown Normal  Final   Culture >=100,000 COLONIES/mL ESCHERICHIA COLI (A)  Final   Report Status 04/08/2017 FINAL  Final   Organism ID, Bacteria ESCHERICHIA COLI (A)  Final      Susceptibility   Escherichia coli - MIC*    AMPICILLIN >=32 RESISTANT Resistant     CEFAZOLIN <=4  SENSITIVE Sensitive     CEFTRIAXONE <=1 SENSITIVE Sensitive     CIPROFLOXACIN >=4 RESISTANT Resistant     GENTAMICIN <=1 SENSITIVE Sensitive     IMIPENEM <=0.25 SENSITIVE Sensitive     NITROFURANTOIN <=16 SENSITIVE Sensitive     TRIMETH/SULFA <=20 SENSITIVE Sensitive     AMPICILLIN/SULBACTAM 16 INTERMEDIATE Intermediate     PIP/TAZO <=4 SENSITIVE Sensitive  Extended ESBL NEGATIVE Sensitive     * >=100,000 COLONIES/mL ESCHERICHIA COLI  Culture, blood (Routine X 2) w Reflex to ID Panel     Status: None (Preliminary result)   Collection Time: 04/06/17 11:33 PM  Result Value Ref Range Status   Specimen Description BLOOD RIGHT HAND  Final   Special Requests   Final    BOTTLES DRAWN AEROBIC AND ANAEROBIC Blood Culture adequate volume   Culture NO GROWTH 2 DAYS  Final   Report Status PENDING  Incomplete  MRSA PCR Screening     Status: None   Collection Time: 04/06/17 11:40 PM  Result Value Ref Range Status   MRSA by PCR NEGATIVE NEGATIVE Final    Comment:        The GeneXpert MRSA Assay (FDA approved for NASAL specimens only), is one component of a comprehensive MRSA colonization surveillance program. It is not intended to diagnose MRSA infection nor to guide or monitor treatment for MRSA infections.   Culture, blood (Routine X 2) w Reflex to ID Panel     Status: None (Preliminary result)   Collection Time: 04/06/17 11:44 PM  Result Value Ref Range Status   Specimen Description BLOOD LEFT HAND  Final   Special Requests IN PEDIATRIC BOTTLE Blood Culture adequate volume  Final   Culture NO GROWTH 2 DAYS  Final   Report Status PENDING  Incomplete    Radiology Studies: No results found.  Scheduled Meds: . amiodarone  100 mg Oral Daily  . amLODipine  10 mg Oral Daily  . atorvastatin  40 mg Oral q1800  . bisoprolol  2.5 mg Oral Daily  . budesonide (PULMICORT) nebulizer solution  0.5 mg Nebulization BID  . cholecalciferol  1,000 Units Oral Daily  . DULoxetine  20 mg Oral  Daily  . feeding supplement (PRO-STAT SUGAR FREE 64)  30 mL Oral BID  . folic acid  1 mg Oral Daily  . insulin aspart  0-20 Units Subcutaneous TID WC  . insulin aspart  0-5 Units Subcutaneous QHS  . insulin aspart  7 Units Subcutaneous TID WC  . insulin glargine  28 Units Subcutaneous Daily  . levothyroxine  50 mcg Oral Daily  . losartan  25 mg Oral Daily  . multivitamin with minerals  1 tablet Oral Daily  . pantoprazole  40 mg Oral Q1200  . spironolactone  25 mg Oral Daily  . torsemide  40 mg Oral BID  . cyanocobalamin  1,000 mcg Oral Daily   Continuous Infusions: . sodium chloride 10 mL/hr at 04/08/17 1325  . cefTRIAXone (ROCEPHIN)  IV Stopped (04/09/17 1601)  . heparin 1,200 Units/hr (04/09/17 1228)  . metronidazole Stopped (04/09/17 1632)    LOS: 3 days   Time spent: 20 minutes   Faye Ramsay, MD [facility] Pager [phone]  If 7PM-7AM, please contact night-coverage [email or web address] Password [facility] 04/09/2017, 8:17 PM

## 2017-04-09 NOTE — Evaluation (Signed)
Physical Therapy Evaluation Patient Details Name: [name] MRN: [id number] DOB: 1960/12/23 Today's Date: 04/09/2017   History of Present Illness  [name] is a 56 y.o. female in consultation for right leg arterial insufficiency. 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 Admitted for severe sepsis, Acute metabolic encephalopathy, acute respiratory failure.  Clinical Impression  Pt admitted with above complications. Pt currently with functional limitations due to the deficits listed below (see PT Problem List). Demonstrates decent mobility overall. Ambulation limited due to pain, but able to tolerate gentle exercises of LEs. Was quite active until recently when her symptoms caused her to stay at home. Pt will benefit from skilled PT to increase their independence and safety with mobility to allow discharge to the venue listed below.       Follow Up Recommendations Home health PT (pending potential surgical options, may need to be updated)    Equipment Recommendations  None recommended by PT    Recommendations for Other Services       Precautions / Restrictions Precautions Precautions: None Restrictions Weight Bearing Restrictions: No      Mobility  Bed Mobility Overal bed mobility: Modified Independent             General bed mobility comments: extra time but no assist   Transfers Overall transfer level: Needs assistance Equipment used: Rolling walker (2 wheeled) Transfers: Sit to/from Stand Sit to Stand: Supervision         General transfer comment: supervision for safety. Cues for hand placement to rise. Minimal instability, greatly improved with support from RW once upright.  Ambulation/Gait Ambulation/Gait assistance: Supervision Ambulation Distance (Feet): 5 Feet Assistive device: Rolling walker (2 wheeled) Gait Pattern/deviations: Step-through pattern;Decreased stride length;Antalgic Gait velocity: decreased Gait velocity interpretation: Below  normal speed for age/gender General Gait Details: Willing to take several steps to recliner. Supervision for safety. no buckling noted. cues for walker placement and to avoid hitting feet.  Stairs            Wheelchair Mobility    Modified Rankin (Stroke Patients Only)       Balance Overall balance assessment: Needs assistance Sitting-balance support: No upper extremity supported;Feet supported Sitting balance-Leahy Scale: Normal     Standing balance support: No upper extremity supported Standing balance-Leahy Scale: Good Standing balance comment: Able to stand, static march with UE support                             Pertinent Vitals/Pain Pain Assessment: 0-10 Pain Score: 9  Pain Location: BIL feet Pain Descriptors / Indicators: Burning Pain Intervention(s): Limited activity within patient's tolerance;Monitored during session;Repositioned    Home Living Family/patient expects to be discharged to:: Private residence Living Arrangements: Other relatives (sister) Available Help at Discharge: Family;Available PRN/intermittently Type of Home: Mobile home Home Access: Stairs to enter Entrance Stairs-Rails: Left;Right Entrance Stairs-Number of Steps: 5-6 Home Layout: One level Home Equipment: Walker - 4 wheels;Shower seat;Wheelchair - [employer] - 2 wheels      Prior Function Level of Independence: Independent with assistive device(s)         Comments: Used rollator for mobility. Was going to a gym for exercise until about 2 weeks ago, then she started HHPT.     Hand Dominance        Extremity/Trunk Assessment   Upper Extremity Assessment Upper Extremity Assessment: Defer to OT evaluation    Lower Extremity Assessment Lower Extremity Assessment: RLE  deficits/detail;LLE deficits/detail RLE Deficits / Details: wound with bone exposed Rt foot 2nd toe LLE Deficits / Details: wound Lt great toe       Communication   Communication: No  difficulties  Cognition Arousal/Alertness: Awake/alert Behavior During Therapy: WFL for tasks assessed/performed Overall Cognitive Status: Within Functional Limits for tasks assessed                                        General Comments General comments (skin integrity, edema, etc.): wound Rt 2nd toe, Lt great toe    Exercises General Exercises - Lower Extremity Ankle Circles/Pumps: AROM;Both;15 reps;Seated Long Arc Quad: Strengthening;Both;10 reps;Seated Hip Flexion/Marching: Strengthening;Both;10 reps;Seated   Assessment/Plan    PT Assessment Patient needs continued PT services  PT Problem List Decreased strength;Decreased range of motion;Decreased activity tolerance;Decreased balance;Decreased mobility;Decreased knowledge of use of DME;Impaired sensation;Obesity;Pain;Decreased skin integrity       PT Treatment Interventions DME instruction;Gait training;Stair training;Functional mobility training;Therapeutic activities;Therapeutic exercise;Balance training;Neuromuscular re-education;Patient/family education    PT Goals (Current goals can be found in the Care Plan section)  Acute Rehab PT Goals Patient Stated Goal: Save her foot PT Goal Formulation: With patient Time For Goal Achievement: 04/23/17 Potential to Achieve Goals: Good    Frequency Min 3X/week   Barriers to discharge        Co-evaluation               AM-PAC PT "6 Clicks" Daily Activity  Outcome Measure Difficulty turning over in bed (including adjusting bedclothes, sheets and blankets)?: None Difficulty moving from lying on back to sitting on the side of the bed? : None Difficulty sitting down on and standing up from a chair with arms (e.g., wheelchair, bedside commode, etc,.)?: A Little Help needed moving to and from a bed to chair (including a wheelchair)?: None Help needed walking in hospital room?: None Help needed climbing 3-5 steps with a railing? : A Lot 6 Click Score:  21    End of Session Equipment Utilized During Treatment: Gait belt Activity Tolerance: Patient limited by pain Patient left: in chair;with call bell/phone within reach;with chair alarm set;with nursing/sitter in room Nurse Communication: Mobility status PT Visit Diagnosis: Muscle weakness (generalized) (M62.81);Difficulty in walking, not elsewhere classified (R26.2);Pain Pain - Right/Left:  (Bilateral) Pain - part of body:  (feet)    Time: 9147-8295 PT Time Calculation (min) (ACUTE ONLY): 16 min   Charges:   PT Evaluation $PT Eval Low Complexity: 1 Procedure     PT G Codes:        Elayne Snare, PT, DPT [phone]  [name] Newer 04/09/2017, 1:00 PM

## 2017-04-10 DIAGNOSIS — I739 Peripheral vascular disease, unspecified: Secondary | ICD-10-CM

## 2017-04-10 DIAGNOSIS — I96 Gangrene, not elsewhere classified: Secondary | ICD-10-CM

## 2017-04-10 LAB — CBC
HCT: 30 % — ABNORMAL LOW (ref 36.0–46.0)
HEMOGLOBIN: 9.6 g/dL — AB (ref 12.0–15.0)
MCH: 29.3 pg (ref 26.0–34.0)
MCHC: 32 g/dL (ref 30.0–36.0)
MCV: 91.5 fL (ref 78.0–100.0)
Platelets: 297 10*3/uL (ref 150–400)
RBC: 3.28 MIL/uL — AB (ref 3.87–5.11)
RDW: 14.9 % (ref 11.5–15.5)
WBC: 11.7 10*3/uL — ABNORMAL HIGH (ref 4.0–10.5)

## 2017-04-10 LAB — BASIC METABOLIC PANEL
ANION GAP: 9 (ref 5–15)
BUN: 51 mg/dL — AB (ref 6–20)
CHLORIDE: 99 mmol/L — AB (ref 101–111)
CO2: 26 mmol/L (ref 22–32)
Calcium: 7.9 mg/dL — ABNORMAL LOW (ref 8.9–10.3)
Creatinine, Ser: 1.44 mg/dL — ABNORMAL HIGH (ref 0.44–1.00)
GFR calc Af Amer: 47 mL/min — ABNORMAL LOW (ref >60)
GFR calc non Af Amer: 40 mL/min — ABNORMAL LOW (ref >60)
Glucose, Bld: 203 mg/dL — ABNORMAL HIGH (ref 65–99)
POTASSIUM: 3.3 mmol/L — AB (ref 3.5–5.1)
SODIUM: 134 mmol/L — AB (ref 135–145)

## 2017-04-10 LAB — PROTIME-INR
INR: 1.52
PROTHROMBIN TIME: 18.5 s — AB (ref 11.4–15.2)

## 2017-04-10 LAB — GLUCOSE, CAPILLARY
GLUCOSE-CAPILLARY: 235 mg/dL — AB (ref 65–99)
GLUCOSE-CAPILLARY: 375 mg/dL — AB (ref 65–99)
GLUCOSE-CAPILLARY: 208 mg/dL — AB (ref 65–99)
Glucose-Capillary: 305 mg/dL — ABNORMAL HIGH (ref 65–99)

## 2017-04-10 LAB — HEPARIN LEVEL (UNFRACTIONATED)
HEPARIN UNFRACTIONATED: 0.36 [IU]/mL (ref 0.30–0.70)
HEPARIN UNFRACTIONATED: 0.31 [IU]/mL (ref 0.30–0.70)
Heparin Unfractionated: 0.28 IU/mL — ABNORMAL LOW (ref 0.30–0.70)

## 2017-04-10 MED ORDER — INSULIN GLARGINE 100 UNIT/ML ~~LOC~~ SOLN
30.0000 [IU] | Freq: Every day | SUBCUTANEOUS | Status: DC
  Administered 2017-04-11: 30 [IU] via SUBCUTANEOUS
  Filled 2017-04-10: qty 0.3

## 2017-04-10 MED ORDER — POTASSIUM CHLORIDE CRYS ER 20 MEQ PO TBCR
40.0000 meq | EXTENDED_RELEASE_TABLET | Freq: Once | ORAL | Status: AC
  Administered 2017-04-10: 40 meq via ORAL
  Filled 2017-04-10: qty 2

## 2017-04-10 MED ORDER — HEPARIN (PORCINE) IN NACL 100-0.45 UNIT/ML-% IJ SOLN
1400.0000 [IU]/h | INTRAMUSCULAR | Status: DC
  Administered 2017-04-10 – 2017-04-12 (×4): 1400 [IU]/h via INTRAVENOUS
  Filled 2017-04-10 (×4): qty 250

## 2017-04-10 NOTE — Progress Notes (Addendum)
Patient ID: [name], female   DOB: [birth year], 56 y.o.   MRN: [id number]    PROGRESS NOTE  JAYNA MULNIX  NWG:[id number] DOB: [birth year] DOA: 04/06/2017  PCP: Sandi Mariscal, MD   Brief Narrative:  Pt is 56 yo female with known CA, CHF, COPD, continues to smoke, DM type II with complications of peripheral vascular disease and neuropathy, nephropathy, cocaine and THC abuse, presented with right foot pain and altered mental state. Per family member, she has taken multiple tablets of percocet at home as well. She was intubated from 5/24 - 5/25 due to concern for airway protection, initial labs should leukocytosis and lactic acidoses.   Assessment & Plan: Acute hypoxic respiratory failure in pt with hx of COPD, OSA, OHS, active smoker  - extubated 5/24 - respiratory status stable this AM  - oxygen saturation > 92 % - provide bronchodilators as needed   Severe sepsis secondary to 2nd toe lower extremity cellulitis in pt with known PVD, also now UTI  (E.Coli) - day #4 of ABX,, was on vanc and zosyn, changed to Rocephin and flagyl on 5/27 based on urine culture reports  - ortho and vascular surgery team following  - pt afebrile this AM - WBC trending down from 18.9 --> 13.6 --> 11.7 - will repeat CBC in AM - Dr. Gwenlyn Found to see pt in AM   Hypokalemia - supplement and repeat BMP in AM  Hypotension from sedation - normotensive   PAF, on coumadin   - rate controlled, HR in 60's this AM  - pt amiodarone, will continue - also on Heparin drip (anticipated intervention)  Chronic combined CHF - weight trend since admission  Filed Weights   04/07/17 0335 04/08/17 0500 04/09/17 0527  Weight: 108.1 kg (238 lb 5.1 oz) 109.5 kg (241 lb 6.5 oz) 115.5 kg (254 lb 9.6 oz)  - cardiology consulted - continue Torsemide 40 mg BID PO - last EF 40-45% with grade II diastolic CHF   HTN, HLD, CAD - pt still on Norvasc, Zebeta, Cozaar, Aldactone, Demadex - BP stable - pt with no anginal concerns this  AM   CKD stage II  - Cr overall improving - will repeat BMP in AM  DM type Ii with complications of nephropathy and PVD - CBG's in 200's - keep on Lantus and SSI   Anemia of critical illness - no signs of active bleeding - CBC in AM  Hx of alcohol and tobacco abuse - already counseled on cessation   Hypothyroidism - on synthroid   Acute metabolic encephalopathy - resolved   Morbid obesity  - Body mass index is 45.1 kg/m.  DVT prophylaxis: heparin  Code Status: Full  Family Communication: Patient at bedside  Disposition Plan: to be determined   Consultants:   Vascular surgery  Ortho   Cardio   STUDIES:   PFT 02/22/16 >> FEV1 2.13 (76%), FEV1% 73, TLC 4.84 (94%), DLCO 49%  Echo 11/17/16 >> EF 40 to 45%, grade 2 DD  PSG 11/22/16 >> AHI 58, SpO2 low 62%, CPAP 19 cm H2O with 1 liter oxygen  CT head 04/06/17 >>  CULTURES:  Blood 5/24 >  Urine 5/24 >  ANTIBIOTICS:  Zosyn 5/24 > 5/27  Vancomycin 5/24 > 5/24  Rocephin 5.27 >  Flagyl 5/27 >  Subjective: Pt denies chest pain or dyspnea this AM  Objective: Vitals:   04/09/17 2156 04/10/17 0514 04/10/17 0851 04/10/17 0959  BP: (!) 141/65 (!) 140/53  129/66  Pulse:  68 69    Resp: 18 18    Temp: 98 F (36.7 C) 98 F (36.7 C)    TempSrc: Oral Oral    SpO2: 97% 100% 97%   Weight:      Height:        Intake/Output Summary (Last 24 hours) at 04/10/17 1443 Last data filed at 04/10/17 3557  Gross per 24 hour  Intake          1478.88 ml  Output             1925 ml  Net          -446.12 ml   Filed Weights   04/07/17 0335 04/08/17 0500 04/09/17 0527  Weight: 108.1 kg (238 lb 5.1 oz) 109.5 kg (241 lb 6.5 oz) 115.5 kg (254 lb 9.6 oz)   Physical Exam  Constitutional: Appears well-developed and well-nourished. No distress.  CVS: RRR, S1/S2 +, no gallops, no carotid bruit.  Pulmonary: Effort and breath sounds normal, no stridor, rhonchi, wheezes, rales.  Abdominal: Soft. BS +,  no distension,  tenderness, rebound or guarding.  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 with visible bone   Data Reviewed: I have personally reviewed following labs and imaging studies  CBC:  Recent Labs Lab 04/06/17 1650 04/07/17 0703 04/08/17 0135 04/09/17 0525 04/10/17 0338  WBC 34.9* 22.0* 18.9* 13.6* 11.7*  NEUTROABS 32.8*  --   --   --   --   HGB 11.7* 9.9* 9.1* 9.7* 9.6*  HCT 35.7* 30.9* 29.3* 30.9* 30.0*  MCV 92.2 95.4 94.5 93.4 91.5  PLT 373 296 282 308 322   Basic Metabolic Panel:  Recent Labs Lab 04/06/17 1650 04/07/17 0703 04/08/17 0135 04/09/17 0525 04/10/17 0338  NA 136 139 135 136 134*  K 4.4 4.1 3.7 4.4 3.3*  CL 99* 104 100* 98* 99*  CO2 23 23 26 30 26   GLUCOSE 284* 327* 326* 261* 203*  BUN 89* 81* 52* 45* 51*  CREATININE 1.99* 1.80* 1.40* 1.54* 1.44*  CALCIUM 9.7 8.8* 8.0* 8.1* 7.9*   Liver Function Tests:  Recent Labs Lab 04/06/17 1650  AST 29  ALT 33  ALKPHOS 161*  BILITOT 0.5  PROT 8.4*  ALBUMIN 3.5   Coagulation Profile:  Recent Labs Lab 04/07/17 0703 04/07/17 1842 04/08/17 0135 04/09/17 0525 04/10/17 0338  INR 2.16 2.33 2.36 1.75 1.52   Cardiac Enzymes:  Recent Labs Lab 04/06/17 2325  TROPONINI 0.06*   CBG:  Recent Labs Lab 04/09/17 1212 04/09/17 1727 04/09/17 2153 04/10/17 0755 04/10/17 1234  GLUCAP 496* 252* 272* 235* 375*   Urine analysis:    Component Value Date/Time   COLORURINE YELLOW 04/06/2017 1705   APPEARANCEUR HAZY (A) 04/06/2017 1705   LABSPEC 1.006 04/06/2017 1705   PHURINE 5.0 04/06/2017 1705   GLUCOSEU >=500 (A) 04/06/2017 1705   HGBUR SMALL (A) 04/06/2017 1705   BILIRUBINUR NEGATIVE 04/06/2017 [place] 04/06/2017 1705   PROTEINUR NEGATIVE 04/06/2017 1705   UROBILINOGEN 0.2 09/05/2015 0012   NITRITE POSITIVE (A) 04/06/2017 1705   LEUKOCYTESUR NEGATIVE 04/06/2017 1705   Recent Results (from the past 240  hour(s))  Urine culture     Status: Abnormal   Collection Time: 04/06/17  5:05 PM  Result Value Ref Range Status   Specimen Description URINE, RANDOM  Final   Special Requests unknown Normal  Final   Culture >=100,000 COLONIES/mL ESCHERICHIA COLI (A)  Final  Report Status 04/08/2017 FINAL  Final   Organism ID, Bacteria ESCHERICHIA COLI (A)  Final      Susceptibility   Escherichia coli - MIC*    AMPICILLIN >=32 RESISTANT Resistant     CEFAZOLIN <=4 SENSITIVE Sensitive     CEFTRIAXONE <=1 SENSITIVE Sensitive     CIPROFLOXACIN >=4 RESISTANT Resistant     GENTAMICIN <=1 SENSITIVE Sensitive     IMIPENEM <=0.25 SENSITIVE Sensitive     NITROFURANTOIN <=16 SENSITIVE Sensitive     TRIMETH/SULFA <=20 SENSITIVE Sensitive     AMPICILLIN/SULBACTAM 16 INTERMEDIATE Intermediate     PIP/TAZO <=4 SENSITIVE Sensitive     Extended ESBL NEGATIVE Sensitive     * >=100,000 COLONIES/mL ESCHERICHIA COLI  Culture, blood (Routine X 2) w Reflex to ID Panel     Status: None (Preliminary result)   Collection Time: 04/06/17 11:33 PM  Result Value Ref Range Status   Specimen Description BLOOD RIGHT HAND  Final   Special Requests   Final    BOTTLES DRAWN AEROBIC AND ANAEROBIC Blood Culture adequate volume   Culture NO GROWTH 3 DAYS  Final   Report Status PENDING  Incomplete  MRSA PCR Screening     Status: None   Collection Time: 04/06/17 11:40 PM  Result Value Ref Range Status   MRSA by PCR NEGATIVE NEGATIVE Final    Comment:        The GeneXpert MRSA Assay (FDA approved for NASAL specimens only), is one component of a comprehensive MRSA colonization surveillance program. It is not intended to diagnose MRSA infection nor to guide or monitor treatment for MRSA infections.   Culture, blood (Routine X 2) w Reflex to ID Panel     Status: None (Preliminary result)   Collection Time: 04/06/17 11:44 PM  Result Value Ref Range Status   Specimen Description BLOOD LEFT HAND  Final   Special Requests IN  PEDIATRIC BOTTLE Blood Culture adequate volume  Final   Culture NO GROWTH 3 DAYS  Final   Report Status PENDING  Incomplete    Radiology Studies: No results found.  Scheduled Meds: . amiodarone  100 mg Oral Daily  . amLODipine  10 mg Oral Daily  . atorvastatin  40 mg Oral q1800  . bisoprolol  2.5 mg Oral Daily  . budesonide (PULMICORT) nebulizer solution  0.5 mg Nebulization BID  . cholecalciferol  1,000 Units Oral Daily  . DULoxetine  20 mg Oral Daily  . feeding supplement (PRO-STAT SUGAR FREE 64)  30 mL Oral BID  . folic acid  1 mg Oral Daily  . insulin aspart  0-20 Units Subcutaneous TID WC  . insulin aspart  0-5 Units Subcutaneous QHS  . insulin aspart  7 Units Subcutaneous TID WC  . insulin glargine  28 Units Subcutaneous Daily  . levothyroxine  50 mcg Oral Daily  . losartan  25 mg Oral Daily  . multivitamin with minerals  1 tablet Oral Daily  . pantoprazole  40 mg Oral Q1200  . spironolactone  25 mg Oral Daily  . torsemide  40 mg Oral BID  . cyanocobalamin  1,000 mcg Oral Daily   Continuous Infusions: . sodium chloride 10 mL/hr at 04/08/17 1325  . cefTRIAXone (ROCEPHIN)  IV Stopped (04/09/17 1601)  . heparin 1,350 Units/hr (04/10/17 0441)  . metronidazole Stopped (04/10/17 1025)    LOS: 4 days   Time spent: 25 minutes   Faye Ramsay, MD [facility] Pager [phone]  If 7PM-7AM, please contact night-coverage [email or web address]  Password [email or web address] 04/10/2017, 2:43 PM

## 2017-04-10 NOTE — Consult Note (Signed)
Patient ID: [name] MRN: [id number], DOB/AGE: [birth year]   Admit date: 04/06/2017  Reason for Consult: LEE PVD Requesting Physician: Dr. Doyle Askew, Internal Medicine    Primary Physician: Sandi Mariscal, MD Primary Cardiologist: Dr. Aundra Dubin [facility]) PV: Dr. Gwenlyn Found   Pt. Profile:  [name] is a 56 y.o. female with h/o PVD, mild nonobstructive CAD, chronic systolic HF, PAF on chronic coumadin, tobacco use, poorly controlled T2DM with neuropathy who is being seen today for the evaluation of PVD, in the setting of non healing foot ulcers w/ LE cellulitis and sepsis, at the request of Dr. Doyle Askew, Internal Medicine.   Problem List  Past Medical History:  Diagnosis Date  . Alcohol abuse   . Alcoholic cirrhosis ([place])   . Alcoholic cirrhosis ([place])   . Anemia   . Anxiety   . B12 deficiency   . CAD (coronary artery disease)    a. cath 11/10/2014 LM nl, LAD min irregs, D1 30 ost, D2 50d, LCX 101m, OM1 80 p/m (1.5 mm vessel), OM2 52m, RCA nondom 48m-->med rx.. Demand ischemia in the setting of rapid a-fib.  . Cardiomyopathy ([place])   . Carotid artery disease ([place])    a. 01/2015 Carotid Angio: RICA 081, LICA 44Y. s/p L carotid endarterectomy 02/2015.  [name] Chronic combined systolic and diastolic CHF (congestive heart failure) ([place])    a. Last echo 12/205: EF 40-45%, inferoapical/posterior HK, not technically sufficient to allow eval of LV diastolic function, normal LA in size..  . Cocaine abuse   . COPD (chronic obstructive pulmonary disease) ([place])   . Critical lower limb ischemia   . Diabetic peripheral neuropathy ([place])   . Elevated troponin    a. Chronic elevation.  [name] GERD (gastroesophageal reflux disease)   . Hyperlipemia   . Hypertension   . Hypokalemia   . Hypomagnesemia   . Hypothyroidism   . Marijuana abuse   . Narcotic abuse   . Noncompliance   . NSVT (nonsustained ventricular tachycardia) ([place])   . Obesity   . PAF (paroxysmal atrial fibrillation) ([place])   . Paroxysmal  atrial tachycardia ([place])   . Paroxysmal atrial tachycardia ([place])   . Peripheral arterial disease ([place])    a. 01/2015 Angio/PTA: LSFA 100 w/ recon @ adductor canal and 1 vessel runoff via AT, RSFA 99 (atherectomy/pta) - 1 vessel runoff via diff dzs peroneal.  . Poorly controlled type 2 diabetes mellitus ([place])   . Renal insufficiency    a. 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  [name] Symptomatic bradycardia    avoid AV blocking agent per EP  . Tobacco abuse     Past Surgical History:  Procedure Laterality Date  . BALLOON ANGIOPLASTY, ARTERY Right 01/15/2015   SFA/notes 01/15/2015  . CARDIAC CATHETERIZATION    . CARDIAC CATHETERIZATION N/A 01/12/2016   Procedure: Temporary Wire;  Surgeon: Minus Breeding, MD;  Location: [place] CV LAB;  Service: Cardiovascular;  Laterality: N/A;  . CARDIOVERSION  ~ 02/2013   "twice"   . CAROTID ANGIOGRAM N/A 01/15/2015   Procedure: CAROTID ANGIOGRAM;  Surgeon: [name], MD;  Location: [facility] CATH LAB;  Service: Cardiovascular;  Laterality: N/A;  . DILATION AND CURETTAGE OF UTERUS  1988  . ENDARTERECTOMY Left 02/19/2015   Procedure: LEFT CAROTID ENDARTERECTOMY ;  Surgeon: Serafina Mitchell, MD;  Location: [place];  Service: Vascular;  Laterality: Left;  . LEFT HEART CATHETERIZATION WITH CORONARY ANGIOGRAM N/A 10/31/2014   Procedure: LEFT HEART CATHETERIZATION WITH CORONARY ANGIOGRAM;  Surgeon: Harrell Gave  Santina Evans, MD;  Location: [place] CATH LAB;  Service: Cardiovascular;  Laterality: N/A;  . LOWER EXTREMITY ANGIOGRAM N/A 09/10/2013   Procedure: LOWER EXTREMITY ANGIOGRAM;  Surgeon: [name], MD;  Location[facility] C[facility] LAB;  Service: Cardiovascular;  Laterality: N/A;  . LOWER EXTREMITY ANGIOGRAM N/A 01/15/2015   Procedure: LOWER EXTREMITY ANGIOGRAM;  Surgeon: [name], MD;  Location: [facility] CATH LAB;  Service: Cardiovascular;  Laterality: N/A;  . PERIPHERAL ATHRECTOMY Right 01/15/2015   SFA/notes 01/15/2015     Allergies  Allergies  Allergen Reactions  . Gabapentin Nausea And  Vomiting    POSSIBLE SHAKING? TAKING MED CURRENTLY    HPI  [name] is a 56 y.o. female with h/o PVD, mild nonobstructive CAD, chronic systolic HF, PAF on chronic coumadin, tobacco use, poorly controlled T2DM with neuropathy who is being seen today for the evaluation of PVD, in the setting of non healing foot ulcers w/ LE cellulitis and sepsis, at the request of Dr. Doyle Askew, Internal Medicine.   She has carotid artery disease s/p total right internal carotid endarterectomy by Dr. Trula Slade. Dr. Gwenlyn Found follows her lower extremity PVD. She had [facility] atherectomy of her right SFA by Dr. Gwenlyn Found 09/10/13 with excellent angiographic and ultrasound results. She had recurrent pain and evidence of restenosis with repeat intervention 01/15/15 using [facility] 1 atherectomy and drug-eluting balloon. She had one vessel run off below the knee via peroneal artery. Her left SFA is known to be occluded with 1 vessel runoff via an anterior tibial.   She presented to [facility] ED 04/06/17 with complaint of severe right foot pain and nonhealing foot ulcers that have progressively worsened over the last several months. Found to be septic from LE cellulitis and UTI. Dry gangrene of 2nd toe on right foot. She was admitted by IM and placed on antibiotics. Cardiology consulted for PV evaluation. Her coumadin is on hold and she is on IV heparin for a/c, in anticipation of possible PV angiogram vs vascular surgery/ amputation.   No other cardiac complaints. She denies CP and dyspnea. Volume is stable. She reports that she quit smoking just a few months ago.    Home Medications  Prior to Admission medications   Medication Sig Start Date End Date Taking? Authorizing Provider  acetaminophen-codeine (TYLENOL #3) 300-30 MG tablet Take 1 tablet by mouth 2 (two) times daily as needed for pain. 03/29/17  Yes [provider]  albuterol (PROVENTIL HFA;VENTOLIN HFA) 108 (90 Base) MCG/ACT inhaler Inhale 2 puffs into the lungs every 6  (six) hours as needed for wheezing or shortness of breath. 03/01/16  Yes Arnoldo Morale, MD  albuterol (PROVENTIL) (2.5 MG/3ML) 0.083% nebulizer solution Take 3 mLs (2.5 mg total) by nebulization every 6 (six) hours as needed. For wheezing. 06/30/15  Yes Arnoldo Morale, MD  amiodarone (PACERONE) 100 MG tablet TAKE 1 TABLET BY MOUTH ONCE DAILY 12/30/16  Yes Larey Dresser, MD  amLODipine (NORVASC) 10 MG tablet Take 10 mg by mouth daily.   Yes [provider]  atorvastatin (LIPITOR) 40 MG tablet TAKE 1 TABLET BY MOUTH ONCE EVERY EVENING 02/24/17  Yes Bensimhon, Shaune Pascal, MD  bisoprolol (ZEBETA) 5 MG tablet TAKE 1/2 TABLET BY MOUTH EVERY DAY 12/30/16  Yes Larey Dresser, MD  cholecalciferol (VITAMIN D) 1000 units tablet Take 1,000 Units by mouth daily.   Yes [provider]  cyanocobalamin 1000 MCG tablet Take 1 tablet (1,000 mcg total) by mouth daily. 05/11/16  Yes Consuelo Pandy,  PA-C  folic acid (FOLVITE) 1 MG tablet TAKE 1 TABLET BY MOUTH EVERY DAY 11/30/16  Yes [name], MD  gabapentin (NEURONTIN) 300 MG capsule Take 300 mg by mouth 4 (four) times daily.   Yes [provider]  insulin aspart (NOVOLOG FLEXPEN) 100 UNIT/ML FlexPen Inject 44 Units into the skin 3 (three) times daily with meals.    Yes [provider]  Insulin Glargine (LANTUS SOLOSTAR) 100 UNIT/ML Solostar Pen Inject 80 Units into the skin daily at 10 pm.    Yes [provider]  levothyroxine (SYNTHROID, LEVOTHROID) 50 MCG tablet Take 1 tablet (50 mcg total) by mouth daily. 06/30/15  Yes Arnoldo Morale, MD  losartan (COZAAR) 25 MG tablet Take 1 tablet (25 mg total) by mouth daily. 08/22/16 11/11/17 Yes TillerySatira Mccallum, PA-C  metolazone (ZAROXOLYN) 2.5 MG tablet TAKE 1 TABLET BY MOUTH ONCE A WEEK ON THURSDAY. MAY TAKE AN ADDITIONAL TABLET AS DIRECTED BY HF CLINIC. 01/27/17  Yes Bensimhon, Shaune Pascal, MD  Multiple Vitamin (MULTIVITAMIN) tablet Take 1 tablet by mouth daily.   Yes  [provider]  potassium chloride SA (K-DUR,KLOR-CON) 20 MEQ tablet Take 1 tablet (20 mEq total) by mouth daily. Take an additional 20 meq (1 tab) on Thursday with metolazone 03/01/17  Yes Clegg, [name] D, NP  spironolactone (ALDACTONE) 25 MG tablet Take 1 tablet (25 mg total) by mouth daily. 03/23/17  Yes Larey Dresser, MD  torsemide (DEMADEX) 20 MG tablet Take 2 tablets (40 mg total) by mouth 2 (two) times daily. 02/24/17  Yes Larey Dresser, MD  warfarin (COUMADIN) 5 MG tablet TAKE 1 TABLET TO 1 AND 1/2 TABLETS BY MOUTH EVERY DAY AS DIRECTED BY COUMADIN CLINIC Patient taking differently: Take 1 and 1/2 tablets (7.5mg ) on Mondays and Fridays. Take 1 tablet (5mg ) once daily on every other day 01/27/17  Yes [name], MD  oxyCODONE-acetaminophen (PERCOCET/ROXICET) 5-325 MG tablet Take 2 tablets by mouth every 4 (four) hours as needed for severe pain. Patient not taking: Reported on 04/07/2017 03/15/17   Long, Wonda Olds, MD    Hospital Medications  . amiodarone  100 mg Oral Daily  . amLODipine  10 mg Oral Daily  . atorvastatin  40 mg Oral q1800  . bisoprolol  2.5 mg Oral Daily  . budesonide (PULMICORT) nebulizer solution  0.5 mg Nebulization BID  . cholecalciferol  1,000 Units Oral Daily  . DULoxetine  20 mg Oral Daily  . feeding supplement (PRO-STAT SUGAR FREE 64)  30 mL Oral BID  . folic acid  1 mg Oral Daily  . insulin aspart  0-20 Units Subcutaneous TID WC  . insulin aspart  0-5 Units Subcutaneous QHS  . insulin aspart  7 Units Subcutaneous TID WC  . insulin glargine  28 Units Subcutaneous Daily  . levothyroxine  50 mcg Oral Daily  . losartan  25 mg Oral Daily  . multivitamin with minerals  1 tablet Oral Daily  . pantoprazole  40 mg Oral Q1200  . spironolactone  25 mg Oral Daily  . torsemide  40 mg Oral BID  . cyanocobalamin  1,000 mcg Oral Daily   . sodium chloride 10 mL/hr at 04/08/17 1325  . cefTRIAXone (ROCEPHIN)  IV Stopped (04/09/17 1601)  . heparin 1,350  Units/hr (04/10/17 0441)  . metronidazole 500 mg (04/10/17 7353)   acetaminophen, diphenhydrAMINE, HYDROcodone-acetaminophen, ondansetron (ZOFRAN) IV  Family History  Family History  Problem Relation Age of Onset  . Hypertension Mother   .  Diabetes Mother   . Cancer Mother        breast, ovarian, colon  . Clotting disorder Mother   . Heart disease Mother   . Heart attack Mother   . Breast cancer Mother   . Hypertension Father   . Heart disease Father   . Emphysema Sister        smoked    Social History  Social History   Social History  . Marital status: Single    Spouse name: N/A  . Number of children: N/A  . Years of education: N/A   Occupational History  . disabled    Social History Main Topics  . Smoking status: Current Some Day Smoker    Packs/day: 0.00    Years: 40.00    Types: E-cigarettes    Last attempt to quit: 11/03/2015  . Smokeless tobacco: Former Systems developer    Types: Snuff  . Alcohol use No     Comment: former alcohol abuse  . Drug use: No     Comment: 04/29/2015 "last drug use was ~ 09/08/2013"  . Sexual activity: No   Other Topics Concern  . Not on file   Social History Narrative   Lives in [place], in [place] with sister.  They are looking to move but don't have a place to go yet.       Review of Systems General:  No chills, fever, night sweats or weight changes.  Cardiovascular:  No chest pain, dyspnea on exertion, edema, orthopnea, palpitations, paroxysmal nocturnal dyspnea. Dermatological: No rash, lesions/masses Respiratory: No cough, dyspnea Urologic: No hematuria, dysuria Abdominal:   No nausea, vomiting, diarrhea, bright red blood per rectum, melena, or hematemesis Neurologic:  No visual changes, wkns, changes in mental status. All other systems reviewed and are otherwise negative except as noted above.  Physical Exam  Blood pressure 129/66, pulse 69, temperature 98 F (36.7 C), temperature source Oral, resp. rate 18, height 5\' 3"  (1.6  m), weight 254 lb 9.6 oz (115.5 kg), last menstrual period 11/27/2012, SpO2 97 %.  General: Pleasant, NAD Psych: Normal affect. Neuro: Alert and oriented X 3. Moves all extremities spontaneously. HEENT: Normal  Neck: Supple without bruits or JVD. Lungs:  Resp regular and unlabored, CTA. Heart: RRR no s3, s4, or murmurs. Abdomen: Soft, non-tender, non-distended, BS + x 4.  Extremities: nonhealing diabetic foot ulcers bilaterally with dry gangrene of 2nd toe on right foot  Labs  Troponin (Point of Care Test) No results for input(s): TROPIPOC in the last 72 hours. No results for input(s): CKTOTAL, CKMB, TROPONINI in the last 72 hours. Lab Results  Component Value Date   WBC 11.7 (H) 04/10/2017   HGB 9.6 (L) 04/10/2017   HCT 30.0 (L) 04/10/2017   MCV 91.5 04/10/2017   PLT 297 04/10/2017    Recent Labs Lab 04/06/17 1650  04/10/17 0338  NA 136  < > 134*  K 4.4  < > 3.3*  CL 99*  < > 99*  CO2 23  < > 26  BUN 89*  < > 51*  CREATININE 1.99*  < > 1.44*  CALCIUM 9.7  < > 7.9*  PROT 8.4*  --   --   BILITOT 0.5  --   --   ALKPHOS 161*  --   --   ALT 33  --   --   AST 29  --   --   GLUCOSE 284*  < > 203*  < > = values in this interval not  displayed. Lab Results  Component Value Date   CHOL 160 10/16/2015   HDL 35 (L) 10/16/2015   LDLCALC 76 10/16/2015   TRIG 226 (H) 04/06/2017   Lab Results  Component Value Date   DDIMER 0.48 10/29/2014     Radiology/Studies  Dg Tibia/fibula Left  Result Date: 04/07/2017 CLINICAL DATA:  Cellulitis and open wounds on both legs. EXAM: LEFT TIBIA AND FIBULA - 2 VIEW COMPARISON:  Left foot 03/15/2017.  Left knee 01/07/2016 FINDINGS: No evidence of acute fracture or dislocation of the left tibia or fibula. No focal bone lesion or bone destruction. Bone cortex appears intact. Soft tissue calcifications consistent with vascular calcifications and phleboliths. Infiltration in the subcutaneous fat likely to represent edema or cellulitis. No soft  tissue gas collections. No radiopaque foreign bodies. IMPRESSION: No acute bony abnormalities. Subcutaneous soft tissue infiltration likely represent cellulitis or edema. Electronically Signed   By: Lucienne Capers M.D.   On: 04/07/2017 02:33   Dg Tibia/fibula Right  Result Date: 04/07/2017 CLINICAL DATA:  Cellulitis and open wounds on both legs. EXAM: RIGHT TIBIA AND FIBULA - 2 VIEW COMPARISON:  Right foot 03/21/2017.  Right knee 08/11/2016. FINDINGS: No evidence of acute fracture or dislocation of the right tibia or fibula. No focal bone lesion or bone destruction. Bone cortex appears intact without erosion. Soft tissue calcifications consistent with phleboliths and vascular calcifications. Infiltration of the subcutaneous fat likely to represent edema or cellulitis. No radiopaque soft tissue foreign bodies or gas collections. Soft tissue defect along the distal lateral lower leg consistent with history of ulceration. IMPRESSION: No acute bony abnormalities. Infiltration in the subcutaneous fatty tissues consistent with cellulitis or edema. Electronically Signed   By: Lucienne Capers M.D.   On: 04/07/2017 02:34   Dg Chest Port 1 View  Result Date: 04/07/2017 CLINICAL DATA:  Intubation. EXAM: PORTABLE CHEST 1 VIEW COMPARISON:  04/06/2017 . FINDINGS: Surgical clips left neck. Endotracheal tube, NG tube in stable position. Cardiomegaly with normal pulmonary vascularity. Low lung volumes with mild basilar atelectasis. Tiny left pleural effusion cannot be excluded. No pneumothorax. IMPRESSION: 1. Lines and tubes in stable position. 2. Low lung volumes with mild basilar atelectasis. Tiny left pleural effusion cannot be excluded. Electronically Signed   By: Marcello Moores  Register   On: 04/07/2017 06:34   Dg Chest Portable 1 View  Result Date: 04/06/2017 CLINICAL DATA:  Endotracheal tube and orogastric tube placement. Initial encounter. EXAM: PORTABLE CHEST 1 VIEW COMPARISON:  Chest radiograph performed 04/06/2017  FINDINGS: The patient's endotracheal tube appears to end at the carina. This should be retracted 3 cm. The patient's enteric tube is seen ending overlying the body of the stomach. There is mild elevation of the left hemidiaphragm. Mild vascular congestion is noted. Mild bibasilar atelectasis seen. No pleural effusion or pneumothorax is seen. The cardiomediastinal silhouette is mildly enlarged. No acute osseous abnormalities are identified. IMPRESSION: 1. Endotracheal tube appears to end at the carina. This should be retracted 3 cm. 2. Enteric tube noted ending overlying the body of the stomach. 3. Mild elevation of the left hemidiaphragm. Mild bibasilar atelectasis seen. 4. Mild vascular congestion and mild cardiomegaly noted. These results were called by telephone at the time of interpretation on 04/06/2017 at 7:44 pm to Dr. Carmin Muskrat, who verbally acknowledged these results. Electronically Signed   By: Garald Balding M.D.   On: 04/06/2017 19:45   Dg Chest Port 1 View  Result Date: 04/06/2017 CLINICAL DATA:  56 y/o  F; possible medication overdose.  EXAM: PORTABLE CHEST 1 VIEW COMPARISON:  56 y/o  F; 02/22/2016 chest radiograph. FINDINGS: Normal cardiac silhouette. Aortic atherosclerosis with calcification. Clear lungs. No pleural effusion or pneumothorax. Bones are unremarkable. IMPRESSION: No active disease. Electronically Signed   By: Kristine Garbe M.D.   On: 04/06/2017 16:38   Dg Foot Complete Left  Result Date: 03/15/2017 CLINICAL DATA:  Cellulitis for 1 month EXAM: LEFT FOOT - COMPLETE 3+ VIEW COMPARISON:  None. FINDINGS: There is no evidence of fracture or dislocation. There is no evidence of arthropathy or other focal bone abnormality. Soft tissues are unremarkable. IMPRESSION: No acute abnormality noted. Electronically Signed   By: Inez Catalina M.D.   On: 03/15/2017 19:39   Dg Foot Complete Right  Result Date: 03/21/2017 CLINICAL DATA:  Nonhealing ulcer second digit EXAM: RIGHT  FOOT COMPLETE - 3+ VIEW COMPARISON:  None. FINDINGS: Frontal, oblique, and lateral views obtained. No fracture or dislocation. No erosive change or bony destruction. There is mild subluxation at the second PIP joint. No appreciable joint space narrowing or erosion. There is an inferior calcaneal spur with plantar calcification at the hindfoot level. There are scattered foci of arterial vascular calcification. There is mild soft tissue swelling dorsally in the mid and forefoot regions. IMPRESSION: Areas of soft tissue swelling. Mild subluxation second PIP joint. No frank dislocation or fracture. No erosive change or bony destruction. No soft tissue ulceration or radiopaque foreign body evident by radiography. There is plantar fascia calcification with a nearby inferior calcaneal spur. There are foci of arterial vascular calcification. Electronically Signed   By: Lowella Grip III M.D.   On: 03/21/2017 16:30   Dg Toe 2nd Right  Result Date: 04/06/2017 CLINICAL DATA:  Right second toe open wound. Clinical concern for osteomyelitis. EXAM: RIGHT SECOND TOE COMPARISON:  Right foot dated 03/21/2017. FINDINGS: PA and 2 oblique views of the right second toe were obtained. There is no lateral view. The patient was not able to cooperate for a true lateral view. There is soft tissue irregularity along the medial aspect of the third toe. No fracture, dislocation, bone erosion, periosteal reaction or soft tissue gas seen. IMPRESSION: No radiographic evidence of osteomyelitis. Electronically Signed   By: Claudie Revering M.D.   On: 04/06/2017 18:15    ECG  NSR -- personally reviewed  Telemetry  Not on telemetry -- personally reviewed    ASSESSMENT AND PLAN  Active Problems:   Severe sepsis ([name])   Gangrene of toe of right foot ([place])  1. PVD: nonhealing foot ulcers with dry gangrene of 2nd toe on right foot, in the setting of severe PVD, poorly controlled DM and tobacco use.  She is on IV antibiotics and  afebrile. Dr. Gwenlyn Found plans to see tomorrow for consideration for repeat PV angiogram. High likelihood that she may require amputation.   2. Sepsis: secondary to cellulitis from foot ulcers and UTI. On IV antibiotics.   3. DM: poorly controlled with neuropathy and nephropathy. On Insulin. Management per primary team.   4. Chronic Systolic HF: Last echo 07/21/6733 showed EF of 40-45%. Followed by Dr. Aundra Dubin in the [facility]. Euvolemic on physical exam.   5. CAD: LHC in 12/15 with 80% stenosis in small OM1, nonobstructive disease in other territories. Stable w/o CP. Continue BB, statin and ARB. No ASA given chronic anticoagulation.   6. Carotid Artery Disease: Known occluded right carotid.  Left CEA in 4/16. Followed by Dr. Trula Slade.   7. Tobacco Abuse: pt reports that she quit  about 1 month ago.   8. PAF: NSR on EKG. On BB for rate control. On Coumadin as an outpatient, however now on hold for likely invasive procedure vs surgery. Continue IV heparin.   Dr. Gwenlyn Found to see tomorrow morning, 04/11/17.   Signed, Lyda Jester, PA-C, MHS 04/10/2017, 10:30 AM [facility] Pager: [phone]  Agree with note written by Ellen Henri  [facility]   Patient well-known to me. I last saw her in the office 03/06/15. She sees Dr. Johnsie Cancel for  cardiomyopathy and PAF. She was initially sent in by Dr. Amalia Hailey for critical limb ischemia. In addition, I did demonstrate angiographically an occluded right internal carotid and 95% left ICA stenosis. Ultimately she underwent uncomplicated left carotid endarterectomy by Dr. Trula Slade 02/19/15. I angiogram. 01/15/15 revealing an occluded right SFA and SFA and performed according to initial atherectomy followed by drug-eluting balloon angioplasty of the entire right SFA with excellent angiographic result. The wound on her right foot ultimately healed. I have not seen her back for over a year and apparently she developed gangrene of her right second toe. In addition, she has venous  ulcerations on both pretibial areas. She was admitted on 04/06/17 with altered mental status probably from narcotic overdose because of pain. In addition, she was septic with leukocytosis and was treated with broad-spectrum antibiotics. Her serum creatinine is 1.4. Apparently the [facility] asked Dr. Donnetta Hutching to see her not realizing that  she was my peripheral vascular patient. Her exam is benign other than a gangrenous right second toe, venous stasis changes as well as venous ulcerations on both legs. I suspect her right SFA is occluded. She'll need re-angiography and potential re-intervention for limb salvage. I do not think that her right second toe is salvageable but I suspect with revascularization and amputation would potentially heal. We will plan on doing the procedure first case Thursday morning.   Quay Burow 04/10/2017 4:02 PM

## 2017-04-10 NOTE — Progress Notes (Signed)
ANTICOAGULATION CONSULT NOTE - Follow Up Consult  Pharmacy Consult for Heparin Indication: atrial fibrillation  Allergies  Allergen Reactions  . Gabapentin Nausea And Vomiting    POSSIBLE SHAKING? TAKING MED CURRENTLY    Patient Measurements: Height: 5\' 3"  (160 cm) Weight: 254 lb 9.6 oz (115.5 kg) IBW/kg (Calculated) : 52.4 Heparin Dosing Weight: 80.7 kg  Vital Signs: Temp: 98 F (36.7 C) (05/28 0514) Temp Source: Oral (05/28 0514) BP: 119/55 (05/28 1520) Pulse Rate: 76 (05/28 1520)  Labs:  Recent Labs  04/08/17 0135 04/09/17 0525  04/10/17 0338 04/10/17 1025 04/10/17 1457  HGB 9.1* 9.7*  --  9.6*  --   --   HCT 29.3* 30.9*  --  30.0*  --   --   PLT 282 308  --  297  --   --   LABPROT 26.2* 20.6*  --  18.5*  --   --   INR 2.36 1.75  --  1.52  --   --   HEPARINUNFRC  --   --   < > 0.28* 0.36 0.31  CREATININE 1.40* 1.54*  --  1.44*  --   --   < > = values in this interval not displayed.  Estimated Creatinine Clearance: 54.7 mL/min (A) (by C-G formula based on SCr of 1.44 mg/dL (H)).   Medications:  Heparin @ 1350 units/hr  Assessment: 55yof on coumadin pta for afib. Coumadin held and she was transitioned to IV heparin pending possible VVS/ortho interventions for right ischemic foot. Confirmatory heparin level is therapeutic on the low end of goal at 0.31. No bleeding.  Goal of Therapy:  Heparin level 0.3-0.7 units/ml Monitor platelets by anticoagulation protocol: Yes   Plan:  1) Increase heparin to 1400 units/hr 2) Daily heparin level and CBC  Deboraha Sprang 04/10/2017,4:17 PM

## 2017-04-10 NOTE — Progress Notes (Signed)
[name] for Heparin when INR <2 Indication: atrial fibrillation  Patient Measurements: Height: 5\' 3"  (160 cm) Weight: 254 lb 9.6 oz (115.5 kg) IBW/kg (Calculated) : 52.4 Heparin Dosing Weight: 80.7kg  Vital Signs: Temp: 98 F (36.7 C) (05/27 2156) Temp Source: Oral (05/27 2156) BP: 141/65 (05/27 2156) Pulse Rate: 68 (05/27 2156)  Labs:  Recent Labs  04/08/17 0135 04/09/17 0525 04/09/17 1823 04/10/17 0338  HGB 9.1* 9.7*  --  9.6*  HCT 29.3* 30.9*  --  30.0*  PLT 282 308  --  297  LABPROT 26.2* 20.6*  --  18.5*  INR 2.36 1.75  --  1.52  HEPARINUNFRC  --   --  0.16* 0.28*  CREATININE 1.40* 1.54*  --  1.44*    Estimated Creatinine Clearance: 54.7 mL/min (A) (by C-G formula based on SCr of 1.44 mg/dL (H)).   Assessment: 79 YOF on warfarin PTA for hx Afib. INR 2.24 on admit - warfarin held for possible VVS/ortho interventions on ischemic R-foot. Pharmacy consulted to start Heparin bridge.   Heparin level this am is slightly subtherapeutic at 0.28. CBC stable.   Goal of Therapy:  Heparin level 0.3-0.7 units/ml Monitor platelets by anticoagulation protocol: Yes   Plan:  1. Increase heparin to 1350 units/hr  2. Repeat heparin level in 6 hours  Vincenza Hews, PharmD, BCPS 04/10/2017, 4:27 AM

## 2017-04-10 NOTE — Progress Notes (Signed)
ANTICOAGULATION CONSULT NOTE - Follow Up Consult  Pharmacy Consult for heparin Indication: atrial fibrillation  Allergies  Allergen Reactions  . Gabapentin Nausea And Vomiting    POSSIBLE SHAKING? TAKING MED CURRENTLY    Patient Measurements: Height: 5\' 3"  (160 cm) Weight: 254 lb 9.6 oz (115.5 kg) IBW/kg (Calculated) : 52.4 Heparin Dosing Weight: 80.7 kg  Vital Signs: Temp: 98 F (36.7 C) (05/28 0514) Temp Source: Oral (05/28 0514) BP: 129/66 (05/28 0959) Pulse Rate: 69 (05/28 0514)  Labs:  Recent Labs  04/08/17 0135 04/09/17 0525 04/09/17 1823 04/10/17 0338 04/10/17 1025  HGB 9.1* 9.7*  --  9.6*  --   HCT 29.3* 30.9*  --  30.0*  --   PLT 282 308  --  297  --   LABPROT 26.2* 20.6*  --  18.5*  --   INR 2.36 1.75  --  1.52  --   HEPARINUNFRC  --   --  0.16* 0.28* 0.36  CREATININE 1.40* 1.54*  --  1.44*  --     Estimated Creatinine Clearance: 54.7 mL/min (A) (by C-G formula based on SCr of 1.44 mg/dL (H)).   Medications:  Scheduled:  . amiodarone  100 mg Oral Daily  . amLODipine  10 mg Oral Daily  . atorvastatin  40 mg Oral q1800  . bisoprolol  2.5 mg Oral Daily  . budesonide (PULMICORT) nebulizer solution  0.5 mg Nebulization BID  . cholecalciferol  1,000 Units Oral Daily  . DULoxetine  20 mg Oral Daily  . feeding supplement (PRO-STAT SUGAR FREE 64)  30 mL Oral BID  . folic acid  1 mg Oral Daily  . insulin aspart  0-20 Units Subcutaneous TID WC  . insulin aspart  0-5 Units Subcutaneous QHS  . insulin aspart  7 Units Subcutaneous TID WC  . insulin glargine  28 Units Subcutaneous Daily  . levothyroxine  50 mcg Oral Daily  . losartan  25 mg Oral Daily  . multivitamin with minerals  1 tablet Oral Daily  . pantoprazole  40 mg Oral Q1200  . spironolactone  25 mg Oral Daily  . torsemide  40 mg Oral BID  . cyanocobalamin  1,000 mcg Oral Daily   Infusions:  . sodium chloride 10 mL/hr at 04/08/17 1325  . cefTRIAXone (ROCEPHIN)  IV Stopped (04/09/17 1601)  .  heparin 1,350 Units/hr (04/10/17 0441)  . metronidazole Stopped (04/10/17 8916)    Assessment: 56 yo female with afib is currently on therapeutic heparin.  Heparin level is 0.36.   Goal of Therapy:  Heparin level 0.3-0.7 units/ml Monitor platelets by anticoagulation protocol: Yes   Plan:  - continue heparin at 1350 units/hr - 6hr heparin level to confirm  Zanetta Dehaan, Tsz-Yin 04/10/2017,11:11 AM

## 2017-04-11 ENCOUNTER — Other Ambulatory Visit: Payer: Self-pay | Admitting: Cardiovascular Disease

## 2017-04-11 ENCOUNTER — Inpatient Hospital Stay (HOSPITAL_COMMUNITY): Payer: Medicare Other

## 2017-04-11 DIAGNOSIS — I70229 Atherosclerosis of native arteries of extremities with rest pain, unspecified extremity: Secondary | ICD-10-CM

## 2017-04-11 DIAGNOSIS — I70269 Atherosclerosis of native arteries of extremities with gangrene, unspecified extremity: Secondary | ICD-10-CM

## 2017-04-11 DIAGNOSIS — I998 Other disorder of circulatory system: Secondary | ICD-10-CM

## 2017-04-11 LAB — GLUCOSE, CAPILLARY
Glucose-Capillary: 263 mg/dL — ABNORMAL HIGH (ref 65–99)
Glucose-Capillary: 271 mg/dL — ABNORMAL HIGH (ref 65–99)
Glucose-Capillary: 276 mg/dL — ABNORMAL HIGH (ref 65–99)
Glucose-Capillary: 96 mg/dL (ref 65–99)

## 2017-04-11 LAB — CBC
HCT: 31.7 % — ABNORMAL LOW (ref 36.0–46.0)
HEMOGLOBIN: 10.1 g/dL — AB (ref 12.0–15.0)
MCH: 29.2 pg (ref 26.0–34.0)
MCHC: 31.9 g/dL (ref 30.0–36.0)
MCV: 91.6 fL (ref 78.0–100.0)
Platelets: 320 10*3/uL (ref 150–400)
RBC: 3.46 MIL/uL — AB (ref 3.87–5.11)
RDW: 14.9 % (ref 11.5–15.5)
WBC: 15.2 10*3/uL — AB (ref 4.0–10.5)

## 2017-04-11 LAB — BASIC METABOLIC PANEL
Anion gap: 9 (ref 5–15)
BUN: 52 mg/dL — AB (ref 6–20)
CHLORIDE: 99 mmol/L — AB (ref 101–111)
CO2: 26 mmol/L (ref 22–32)
Calcium: 8.3 mg/dL — ABNORMAL LOW (ref 8.9–10.3)
Creatinine, Ser: 1.3 mg/dL — ABNORMAL HIGH (ref 0.44–1.00)
GFR calc Af Amer: 53 mL/min — ABNORMAL LOW (ref >60)
GFR calc non Af Amer: 46 mL/min — ABNORMAL LOW (ref >60)
Glucose, Bld: 239 mg/dL — ABNORMAL HIGH (ref 65–99)
Potassium: 3.9 mmol/L (ref 3.5–5.1)
SODIUM: 134 mmol/L — AB (ref 135–145)

## 2017-04-11 LAB — PROTIME-INR
INR: 1.18
Prothrombin Time: 15.1 seconds (ref 11.4–15.2)

## 2017-04-11 LAB — HEPARIN LEVEL (UNFRACTIONATED): HEPARIN UNFRACTIONATED: 0.53 [IU]/mL (ref 0.30–0.70)

## 2017-04-11 MED ORDER — INSULIN GLARGINE 100 UNIT/ML ~~LOC~~ SOLN
38.0000 [IU] | Freq: Every day | SUBCUTANEOUS | Status: DC
  Administered 2017-04-12: 38 [IU] via SUBCUTANEOUS
  Filled 2017-04-11: qty 0.38

## 2017-04-11 MED ORDER — INSULIN ASPART 100 UNIT/ML ~~LOC~~ SOLN
15.0000 [IU] | Freq: Three times a day (TID) | SUBCUTANEOUS | Status: DC
  Administered 2017-04-11 – 2017-04-14 (×7): 15 [IU] via SUBCUTANEOUS

## 2017-04-11 NOTE — Progress Notes (Signed)
VASCULAR LAB PRELIMINARY  ARTERIAL  ABI completed:    RIGHT    LEFT    PRESSURE WAVEFORM  PRESSURE WAVEFORM  BRACHIAL 147 Triphasic BRACHIAL 145 Triphasic  DP 39 Severely Dampened Monophasic  DP 55 Severely Dampened Monophasic  PT   PT    PER   PER    GREAT TOE Absent NA GREAT TOE 43 NA    RIGHT LEFT  ABI 0.27 0.37    Technically difficult due to constant involuntary jerking of the feet. Unable to adequately evaluate the posterior tibial and peroneal arteries for that reason. There did appear to have barely audible pulses in the posterior tibial but no conclusive. The ABI on the right indicate a critical reduction on arterial flow. The left ABI indicates a severe reduction in arterial flow.Unable to obtain a right great toe pressure. Abnormal left great toe pressures   Parris Signer, RVS 04/11/2017, 12:16 PM

## 2017-04-11 NOTE — Consult Note (Signed)
Pt had consult performed by ortho and vascular service after [place] consult was performed; please refer to vascular team for further questions regarding plan of care. Please re-consult if further assistance is needed.  Thank-you,  Julien Girt MSN, [place], [place], [place], [place]

## 2017-04-11 NOTE — Progress Notes (Signed)
Physical Therapy Treatment Patient Details Name: [name] MRN: [id number] DOB: 1961/03/31 Today's Date: 04/11/2017    History of Present Illness [name] is a 56 y.o. female in consultation for right leg arterial insufficiency. 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 Admitted for severe sepsis, Acute metabolic encephalopathy, acute respiratory failure.    PT Comments    Progressing steadily, but limited by pain from ?claudication during gait.  Emphasis also on active/resistive exercise in sitting as warm up prior to mobility.   Follow Up Recommendations  Home health PT     Equipment Recommendations  None recommended by PT    Recommendations for Other Services       Precautions / Restrictions Precautions Precautions: None    Mobility  Bed Mobility                  Transfers Overall transfer level: Needs assistance Equipment used: Rolling walker (2 wheeled) Transfers: Sit to/from Stand Sit to Stand: Supervision         General transfer comment: for safety only  Ambulation/Gait Ambulation/Gait assistance: Supervision Ambulation Distance (Feet): 70 Feet Assistive device: Rolling walker (2 wheeled) Gait Pattern/deviations: Step-through pattern Gait velocity: decreased Gait velocity interpretation: Below normal speed for age/gender General Gait Details: slow, but generally steady gait with the RW   Stairs            Wheelchair Mobility    Modified Rankin (Stroke Patients Only)       Balance     Sitting balance-Leahy Scale: Normal       Standing balance-Leahy Scale: Good                              Cognition Arousal/Alertness: Awake/alert Behavior During Therapy: WFL for tasks assessed/performed Overall Cognitive Status: Within Functional Limits for tasks assessed                                        Exercises General Exercises - Lower Extremity Long Arc Quad: Strengthening;Both;10  reps;Seated Hip Flexion/Marching: Strengthening;Both;10 reps;Seated Toe Raises: AAROM;Both;15 reps;Seated Heel Raises: AROM;Both;15 reps;Seated Other Exercises Other Exercises: bicep/tricep pressess x10 reps resisted Other Exercises: reach up/out and across body to target bil x10 reps    General Comments        Pertinent Vitals/Pain Pain Assessment: 0-10 Pain Score: 5  Pain Location: calves with walking Pain Descriptors / Indicators: Burning Pain Intervention(s): Monitored during session;Limited activity within patient's tolerance    Home Living                      Prior Function            PT Goals (current goals can now be found in the care plan section) Acute Rehab PT Goals Patient Stated Goal: Save her foot Time For Goal Achievement: 04/23/17 Potential to Achieve Goals: Good    Frequency    Min 3X/week      PT Plan Current plan remains appropriate    Co-evaluation              AM-PAC PT "6 Clicks" Daily Activity  Outcome Measure  Difficulty turning over in bed (including adjusting bedclothes, sheets and blankets)?: None Difficulty moving from lying on back to sitting on the side of the bed? : None Difficulty sitting down on  and standing up from a chair with arms (e.g., wheelchair, bedside commode, etc,.)?: None Help needed moving to and from a bed to chair (including a wheelchair)?: None Help needed walking in hospital room?: A Little Help needed climbing 3-5 steps with a railing? : A Little 6 Click Score: 22    End of Session   Activity Tolerance: Patient limited by pain Patient left: in chair;with call bell/phone within reach;with chair alarm set;with nursing/sitter in room Nurse Communication: Mobility status PT Visit Diagnosis: Muscle weakness (generalized) (M62.81);Difficulty in walking, not elsewhere classified (R26.2);Pain Pain - part of body:  (legs)     Time: 2182-8833 PT Time Calculation (min) (ACUTE ONLY): 17  min  Charges:  $Gait Training: 8-22 mins                    G Codes:       2017/04/22  Donnella Sham, PT [phone]  (pager)   Tessie Fass Amayah Staheli 04/22/17, 5:20 PM

## 2017-04-11 NOTE — Progress Notes (Signed)
Patient refusing BIPAP for tonight, states she is suppose to but cannot wear our masks. RT made pt aware that if she changed her mind we could get one set up for her at any time.

## 2017-04-11 NOTE — Progress Notes (Signed)
Progress Note  Patient Name: [name] Date of Encounter: 04/11/2017  Primary Cardiologist: Nishan/Berry (PV)  Subjective   Breathing better this am. No pain  Inpatient Medications    Scheduled Meds: . amiodarone  100 mg Oral Daily  . amLODipine  10 mg Oral Daily  . atorvastatin  40 mg Oral q1800  . bisoprolol  2.5 mg Oral Daily  . budesonide (PULMICORT) nebulizer solution  0.5 mg Nebulization BID  . cholecalciferol  1,000 Units Oral Daily  . DULoxetine  20 mg Oral Daily  . feeding supplement (PRO-STAT SUGAR FREE 64)  30 mL Oral BID  . folic acid  1 mg Oral Daily  . insulin aspart  0-20 Units Subcutaneous TID WC  . insulin aspart  0-5 Units Subcutaneous QHS  . insulin aspart  7 Units Subcutaneous TID WC  . insulin glargine  30 Units Subcutaneous Daily  . levothyroxine  50 mcg Oral Daily  . losartan  25 mg Oral Daily  . multivitamin with minerals  1 tablet Oral Daily  . pantoprazole  40 mg Oral Q1200  . spironolactone  25 mg Oral Daily  . torsemide  40 mg Oral BID  . cyanocobalamin  1,000 mcg Oral Daily   Continuous Infusions: . sodium chloride 10 mL/hr at 04/08/17 1325  . cefTRIAXone (ROCEPHIN)  IV Stopped (04/10/17 1605)  . heparin 1,400 Units/hr (04/10/17 1717)  . metronidazole Stopped (04/11/17 0944)   PRN Meds: acetaminophen, diphenhydrAMINE, HYDROcodone-acetaminophen, ondansetron (ZOFRAN) IV   Vital Signs    Vitals:   04/10/17 1958 04/10/17 2054 04/11/17 0517 04/11/17 0837  BP:  (!) 142/64 140/66 (!) 147/63  Pulse:  77 62   Resp:  18 20   Temp:  98.3 F (36.8 C) 98.1 F (36.7 C)   TempSrc:      SpO2: 98% 94% 94%   Weight:      Height:        Intake/Output Summary (Last 24 hours) at 04/11/17 0905 Last data filed at 04/11/17 0512  Gross per 24 hour  Intake          1208.83 ml  Output             1900 ml  Net          -691.17 ml   Filed Weights   04/07/17 0335 04/08/17 0500 04/09/17 0527  Weight: 238 lb 5.1 oz (108.1 kg) 241 lb 6.5 oz  (109.5 kg) 254 lb 9.6 oz (115.5 kg)    Telemetry      ECG     Physical Exam   GEN: No acute distress.   Neck: No JVD Cardiac: RRR, no murmurs, rubs, or gallops.  Respiratory: Clear to auscultation bilaterally. GI: Soft, nontender, non-distended  EXT: 2+ bilateral LE edema, both legs wrapped. Right second toe black Neuro:  Nonfocal  Psych: Normal affect   Labs    Chemistry Recent Labs Lab 04/06/17 1650  04/09/17 0525 04/10/17 0338 04/11/17 0714  NA 136  < > 136 134* 134*  K 4.4  < > 4.4 3.3* 3.9  CL 99*  < > 98* 99* 99*  CO2 23  < > 30 26 26   GLUCOSE 284*  < > 261* 203* 239*  BUN 89*  < > 45* 51* 52*  CREATININE 1.99*  < > 1.54* 1.44* 1.30*  CALCIUM 9.7  < > 8.1* 7.9* 8.3*  PROT 8.4*  --   --   --   --   ALBUMIN 3.5  --   --   --   --  AST 29  --   --   --   --   ALT 33  --   --   --   --   ALKPHOS 161*  --   --   --   --   BILITOT 0.5  --   --   --   --   GFRNONAA 27*  < > 37* 40* 46*  GFRAA 32*  < > 43* 47* 53*  ANIONGAP 14  < > 8 9 9   < > = values in this interval not displayed.   Hematology Recent Labs Lab 04/09/17 0525 04/10/17 0338 04/11/17 0714  WBC 13.6* 11.7* 15.2*  RBC 3.31* 3.28* 3.46*  HGB 9.7* 9.6* 10.1*  HCT 30.9* 30.0* 31.7*  MCV 93.4 91.5 91.6  MCH 29.3 29.3 29.2  MCHC 31.4 32.0 31.9  RDW 15.1 14.9 14.9  PLT 308 297 320    Radiology    No results found.  Cardiac Studies    Patient Profile     56 y.o. female with history of PAD, DM, systolic CHF, CAD, carotid artery disease, PAF who is admitted with altered mental status and sepsis. She has been seen by Dr. Gwenlyn Found in regards to her PAD and gangrenous right second toe.   Assessment & Plan    1. PAD: Plans for LE angiography Thursday per Dr. Gwenlyn Found. Continue antibiotics.   Signed, Lauree Chandler, MD  04/11/2017, 9:05 AM

## 2017-04-11 NOTE — Care Management Important Message (Signed)
Important Message  Patient Details  Name: [name] MRN: [id number] Date of Birth: 01-Feb-1961   Medicare Important Message Given:  Yes    [name] 04/11/2017, 1:21 PM

## 2017-04-11 NOTE — Progress Notes (Signed)
Patient ID: [name], female   DOB: December 12, 1960, 56 y.o.   MRN: [id number]    PROGRESS NOTE  [name]  KKX:[id number] DOB: Feb 24, 1961 DOA: 04/06/2017  PCP: Sandi Mariscal, MD   Brief Narrative:  Pt is 56 yo female with known CA, CHF, COPD, continues to smoke, DM type II with complications of peripheral vascular disease and neuropathy, nephropathy, cocaine and THC abuse, presented with right foot pain and altered mental state. Per family member, she has taken multiple tablets of percocet at home as well. She was intubated from 5/24 - 5/25 due to concern for airway protection, initial labs should leukocytosis and lactic acidosis. Pt was admitted under PCCM service and care was transferred to [facility] 04/09/2017.  Assessment & Plan: Acute hypoxic respiratory failure in pt with hx of COPD, OSA, OHS, active smoker  - extubated 5/24 - Respiratory status has remained stable - Patient maintaining oxygen saturation 96% patient maintaining oxygen saturation at target range, 96% this morning - Continue to provide bronchodilators as needed  Severe sepsis secondary to 2nd toe lower extremity cellulitis in pt with known PVD, also UTI  (E.Coli) - Patient was initially on vancomycin and Zosyn, this was changed to Rocephin and Flagyl on 04/09/2017 - Rocephin should be adequate for UTI based on the sensitivity report - Today's day #5 of antibiotics - Appreciate Dr. Kennon Holter assistance  - plan for LE angiography on Thursday per Dr. Gwenlyn Found - Patient remains afebrile however WBC is up this morning: 13 --> 11 --> 15.2 - Repeat CBC in the morning  Hypokalemia - Potassium was supplemented and it's within normal limits this morning - repeat BMP in the morning   Hypotension from sedation -  this has now resolved, blood pressure remained stable and at target range   PAF, on coumadin   - rate controlled, heart rate in 60s this morning - Patient is on amiodarone as well as heparin drip - Please note that patient  takes Coumadin at home but this was temporarily held due to planned intervention on Thursday  Chronic combined CHF - weight trend since admission  Filed Weights   04/07/17 0335 04/08/17 0500 04/09/17 0527  Weight: 108.1 kg (238 lb 5.1 oz) 109.5 kg (241 lb 6.5 oz) 115.5 kg (254 lb 9.6 oz)  - Last EF 40-45% with grade 2 diastolic CHF - Patient euvolemic - Continue torsemide 40 mg by mouth twice a day  HTN, HLD, CAD - Blood pressure currently stable  - patient is on Norvasc 10 mg PO QD, bisoprolol 2.5 mg PO QD, losartan 25 mg PO QD, spironolactone 25 mg PO QD, torsemide 40 mg PO BID - no anginal symptoms   CKD stage II  - Creatinine overall improving, 1.3 this morning - We will repeat BMP in the morning  DM type Ii with complications of nephropathy and PVD - patient is currently on insulin Lantus 30 units - Also on sliding scale insulin resistant coverage with 7 units of meal coverage 3 times a day daily along with nighttime coverage - Readjust dosing as clinically indicated - CBGs remain in 200's - diabetic educator consulted  Anemia of critical illness - Hemoglobin stable, no signs of active bleeding  Hx of alcohol and tobacco abuse - patient has been counseled on cessation and verbalized understanding   Hypothyroidism - continue Synthroid   Acute metabolic encephalopathy -  secondary to sepsis outlined above, now resolved   Morbid obesity  - Body mass index is 45.1 kg/m.  DVT prophylaxis:  heparin drip  Code Status: Full  Family Communication:  discussed plan with patient at bedside  Disposition Plan: To be determined, once cleared by cardiology   Consultants:   Vascular surgery  Ortho   Cardio   STUDIES:   PFT 02/22/16 >> FEV1 2.13 (76%), FEV1% 73, TLC 4.84 (94%), DLCO 49%  Echo 11/17/16 >> EF 40 to 45%, grade 2 DD  PSG 11/22/16 >> AHI 58, SpO2 low 62%, CPAP 19 cm H2O with 1 liter oxygen  CT head 04/06/17 >>  CULTURES:  Blood 5/24 >  Urine 5/24  >  ANTIBIOTICS:  Zosyn 5/24 > 5/27  Vancomycin 5/24 > 5/24  Rocephin 5.27 >  Flagyl 5/27 >  Subjective: Patient reports feeling better, denies chest pain or shortness of breath.  Objective: Vitals:   04/10/17 2054 04/11/17 0517 04/11/17 0837 04/11/17 0911  BP: (!) 142/64 140/66 (!) 147/63   Pulse: 77 62    Resp: 18 20    Temp: 98.3 F (36.8 C) 98.1 F (36.7 C)    TempSrc:      SpO2: 94% 94%  96%  Weight:      Height:        Intake/Output Summary (Last 24 hours) at 04/11/17 1025 Last data filed at 04/11/17 0512  Gross per 24 hour  Intake          1208.83 ml  Output             1900 ml  Net          -691.17 ml   Filed Weights   04/07/17 0335 04/08/17 0500 04/09/17 0527  Weight: 108.1 kg (238 lb 5.1 oz) 109.5 kg (241 lb 6.5 oz) 115.5 kg (254 lb 9.6 oz)   Physical Exam  Constitutional: Appears well-developed and well-nourished. No distress.  CVS: RRR, S1/S2 +, no murmurs, no gallops, no carotid bruit.  Pulmonary: Effort and breath sounds normal, no stridor, rhonchi, wheezes, rales.  Abdominal: Soft. BS +,  no distension, tenderness, rebound or guarding.  Musculoskeletal: Bilateral lower extremity pitting edema +2, wounds noted on both lower extremities, dry gangrene on the right second toe with visible bone  Data Reviewed: I have personally reviewed following labs and imaging studies  CBC:  Recent Labs Lab 04/06/17 1650 04/07/17 0703 04/08/17 0135 04/09/17 0525 04/10/17 0338 04/11/17 0714  WBC 34.9* 22.0* 18.9* 13.6* 11.7* 15.2*  NEUTROABS 32.8*  --   --   --   --   --   HGB 11.7* 9.9* 9.1* 9.7* 9.6* 10.1*  HCT 35.7* 30.9* 29.3* 30.9* 30.0* 31.7*  MCV 92.2 95.4 94.5 93.4 91.5 91.6  PLT 373 296 282 308 297 267   Basic Metabolic Panel:  Recent Labs Lab 04/07/17 0703 04/08/17 0135 04/09/17 0525 04/10/17 0338 04/11/17 0714  NA 139 135 136 134* 134*  K 4.1 3.7 4.4 3.3* 3.9  CL 104 100* 98* 99* 99*  CO2 23 26 30 26 26   GLUCOSE 327* 326* 261* 203*  239*  BUN 81* 52* 45* 51* 52*  CREATININE 1.80* 1.40* 1.54* 1.44* 1.30*  CALCIUM 8.8* 8.0* 8.1* 7.9* 8.3*   Liver Function Tests:  Recent Labs Lab 04/06/17 1650  AST 29  ALT 33  ALKPHOS 161*  BILITOT 0.5  PROT 8.4*  ALBUMIN 3.5   Coagulation Profile:  Recent Labs Lab 04/07/17 1842 04/08/17 0135 04/09/17 0525 04/10/17 0338 04/11/17 0714  INR 2.33 2.36 1.75 1.52 1.18   Cardiac Enzymes:  Recent Labs Lab 04/06/17  2325  TROPONINI 0.06*   CBG:  Recent Labs Lab 04/10/17 0755 04/10/17 1234 04/10/17 1725 04/10/17 2055 04/11/17 0817  GLUCAP 235* 375* 305* 208* 263*   Urine analysis:    Component Value Date/Time   COLORURINE YELLOW 04/06/2017 1705   APPEARANCEUR HAZY (A) 04/06/2017 1705   LABSPEC 1.006 04/06/2017 1705   PHURINE 5.0 04/06/2017 1705   GLUCOSEU >=500 (A) 04/06/2017 1705   HGBUR SMALL (A) 04/06/2017 1705   BILIRUBINUR NEGATIVE 04/06/2017 [place] 04/06/2017 1705   PROTEINUR NEGATIVE 04/06/2017 1705   UROBILINOGEN 0.2 09/05/2015 0012   NITRITE POSITIVE (A) 04/06/2017 1705   LEUKOCYTESUR NEGATIVE 04/06/2017 1705   Recent Results (from the past 240 hour(s))  Urine culture     Status: Abnormal   Collection Time: 04/06/17  5:05 PM  Result Value Ref Range Status   Specimen Description URINE, RANDOM  Final   Special Requests unknown Normal  Final   Culture >=100,000 COLONIES/mL ESCHERICHIA COLI (A)  Final   Report Status 04/08/2017 FINAL  Final   Organism ID, Bacteria ESCHERICHIA COLI (A)  Final      Susceptibility   Escherichia coli - MIC*    AMPICILLIN >=32 RESISTANT Resistant     CEFAZOLIN <=4 SENSITIVE Sensitive     CEFTRIAXONE <=1 SENSITIVE Sensitive     CIPROFLOXACIN >=4 RESISTANT Resistant     GENTAMICIN <=1 SENSITIVE Sensitive     IMIPENEM <=0.25 SENSITIVE Sensitive     NITROFURANTOIN <=16 SENSITIVE Sensitive     TRIMETH/SULFA <=20 SENSITIVE Sensitive     AMPICILLIN/SULBACTAM 16 INTERMEDIATE Intermediate      PIP/TAZO <=4 SENSITIVE Sensitive     Extended ESBL NEGATIVE Sensitive     * >=100,000 COLONIES/mL ESCHERICHIA COLI  Culture, blood (Routine X 2) w Reflex to ID Panel     Status: None (Preliminary result)   Collection Time: 04/06/17 11:33 PM  Result Value Ref Range Status   Specimen Description BLOOD RIGHT HAND  Final   Special Requests   Final    BOTTLES DRAWN AEROBIC AND ANAEROBIC Blood Culture adequate volume   Culture NO GROWTH 3 DAYS  Final   Report Status PENDING  Incomplete  MRSA PCR Screening     Status: None   Collection Time: 04/06/17 11:40 PM  Result Value Ref Range Status   MRSA by PCR NEGATIVE NEGATIVE Final    Comment:        The GeneXpert MRSA Assay (FDA approved for NASAL specimens only), is one component of a comprehensive MRSA colonization surveillance program. It is not intended to diagnose MRSA infection nor to guide or monitor treatment for MRSA infections.   Culture, blood (Routine X 2) w Reflex to ID Panel     Status: None (Preliminary result)   Collection Time: 04/06/17 11:44 PM  Result Value Ref Range Status   Specimen Description BLOOD LEFT HAND  Final   Special Requests IN PEDIATRIC BOTTLE Blood Culture adequate volume  Final   Culture NO GROWTH 3 DAYS  Final   Report Status PENDING  Incomplete    Radiology Studies: No results found.  Scheduled Meds: . amiodarone  100 mg Oral Daily  . amLODipine  10 mg Oral Daily  . atorvastatin  40 mg Oral q1800  . bisoprolol  2.5 mg Oral Daily  . budesonide (PULMICORT) nebulizer solution  0.5 mg Nebulization BID  . cholecalciferol  1,000 Units Oral Daily  . DULoxetine  20 mg Oral Daily  . feeding supplement (PRO-STAT SUGAR  FREE 64)  30 mL Oral BID  . folic acid  1 mg Oral Daily  . insulin aspart  0-20 Units Subcutaneous TID WC  . insulin aspart  0-5 Units Subcutaneous QHS  . insulin aspart  7 Units Subcutaneous TID WC  . insulin glargine  30 Units Subcutaneous Daily  . levothyroxine  50 mcg Oral Daily  .  losartan  25 mg Oral Daily  . multivitamin with minerals  1 tablet Oral Daily  . pantoprazole  40 mg Oral Q1200  . spironolactone  25 mg Oral Daily  . torsemide  40 mg Oral BID  . cyanocobalamin  1,000 mcg Oral Daily   Continuous Infusions: . sodium chloride 10 mL/hr at 04/08/17 1325  . cefTRIAXone (ROCEPHIN)  IV Stopped (04/10/17 1605)  . heparin 1,400 Units/hr (04/10/17 1717)  . metronidazole Stopped (04/11/17 0944)    LOS: 5 days   Time spent: 25 minutes   Faye Ramsay, MD [facility] Pager [phone]  If 7PM-7AM, please contact night-coverage [email or web address] Password [facility] 04/11/2017, 10:25 AM

## 2017-04-11 NOTE — Progress Notes (Signed)
ANTICOAGULATION CONSULT NOTE - Follow Up Consult  Pharmacy Consult for Heparin Indication: atrial fibrillation  Allergies  Allergen Reactions  . Gabapentin Nausea And Vomiting    POSSIBLE SHAKING? TAKING MED CURRENTLY    Patient Measurements: Height: 5\' 3"  (160 cm) Weight: 254 lb 9.6 oz (115.5 kg) IBW/kg (Calculated) : 52.4  Vital Signs: Temp: 98.1 F (36.7 C) (05/29 0517) BP: 147/63 (05/29 0837) Pulse Rate: 62 (05/29 0517)  Labs:  Recent Labs  04/09/17 0525  04/10/17 0338 04/10/17 1025 04/10/17 1457 04/11/17 0714  HGB 9.7*  --  9.6*  --   --  10.1*  HCT 30.9*  --  30.0*  --   --  31.7*  PLT 308  --  297  --   --  320  LABPROT 20.6*  --  18.5*  --   --  15.1  INR 1.75  --  1.52  --   --  1.18  HEPARINUNFRC  --   < > 0.28* 0.36 0.31 0.53  CREATININE 1.54*  --  1.44*  --   --  1.30*  < > = values in this interval not displayed.  Estimated Creatinine Clearance: 60.6 mL/min (A) (by C-G formula based on SCr of 1.3 mg/dL (H)).   Medications:  Scheduled:  . amiodarone  100 mg Oral Daily  . amLODipine  10 mg Oral Daily  . atorvastatin  40 mg Oral q1800  . bisoprolol  2.5 mg Oral Daily  . budesonide (PULMICORT) nebulizer solution  0.5 mg Nebulization BID  . cholecalciferol  1,000 Units Oral Daily  . DULoxetine  20 mg Oral Daily  . feeding supplement (PRO-STAT SUGAR FREE 64)  30 mL Oral BID  . folic acid  1 mg Oral Daily  . insulin aspart  0-20 Units Subcutaneous TID WC  . insulin aspart  0-5 Units Subcutaneous QHS  . insulin aspart  15 Units Subcutaneous TID WC  . [START ON 04/12/2017] insulin glargine  38 Units Subcutaneous Daily  . levothyroxine  50 mcg Oral Daily  . losartan  25 mg Oral Daily  . multivitamin with minerals  1 tablet Oral Daily  . pantoprazole  40 mg Oral Q1200  . spironolactone  25 mg Oral Daily  . torsemide  40 mg Oral BID  . cyanocobalamin  1,000 mcg Oral Daily    Assessment: 56yo female on heparin bridge while off Coumadin; pt being  evaluated for potential need for surgical intervention of ischemic R-foot.  INR has been drifting down off Coumadin.  Heparin level is therapeutic on current rate, Hg is stable and pltc wnl.  No bleeding noted.  LE angiography planned for 5/31.  Goal of Therapy:  Heparin level 0.3-0.7 units/ml Monitor platelets by anticoagulation protocol: Yes   Plan:  Continue heparin 1400 units/hr Watch for s/s of bleeding F/U in AM  Lewie Chamber., PharmD [facility]

## 2017-04-11 NOTE — Progress Notes (Signed)
Inpatient Diabetes Program Recommendations  AACE/ADA: New Consensus Statement on Inpatient Glycemic Control (2015)  Target Ranges:  Prepandial:   less than 140 mg/dL      Peak postprandial:   less than 180 mg/dL (1-2 hours)      Critically ill patients:  140 - 180 mg/dL   Results for SEFORA, TIETJE (MRN [id number]) as of 04/11/2017 11:03  Ref. Range 04/10/2017 07:55 04/10/2017 12:34 04/10/2017 17:25 04/10/2017 20:55 04/11/2017 08:17  Glucose-Capillary Latest Ref Range: 65 - 99 mg/dL 235 (H)  Novolog 14 units given 375 (H)  Novolog 27 units given 305 (H)  Novolog 22 units given 208 (H) 263 (H)  Novolog 18 units given   Review of Glycemic Control  Diabetes history: DM 2 Outpatient Diabetes medications: Lantus 80 units, Novolog 44 untis tid Current orders for Inpatient glycemic control: Lantus 30 units, Novolog Resistant + Novolog HS scale, Novolog 7 units tid meal coverage  Inpatient Diabetes Program Recommendations:    Patient receiving at least 20 units of Novolog with each administration time. Please consider increasing meal coverage to Novolog 15 units tid. Consider increasing Lantus to 38-40 units (half of home dose)  Thanks,  Tama Headings RN, MSN, [facility] Inpatient Diabetes Coordinator Team Pager [phone] (8a-5p)

## 2017-04-12 ENCOUNTER — Inpatient Hospital Stay (HOSPITAL_COMMUNITY): Admission: RE | Admit: 2017-04-12 | Payer: Medicare Other | Source: Ambulatory Visit

## 2017-04-12 ENCOUNTER — Encounter (HOSPITAL_COMMUNITY): Payer: Self-pay

## 2017-04-12 DIAGNOSIS — I739 Peripheral vascular disease, unspecified: Secondary | ICD-10-CM

## 2017-04-12 DIAGNOSIS — L03115 Cellulitis of right lower limb: Secondary | ICD-10-CM

## 2017-04-12 LAB — CULTURE, BLOOD (ROUTINE X 2)
CULTURE: NO GROWTH
CULTURE: NO GROWTH
SPECIAL REQUESTS: ADEQUATE
Special Requests: ADEQUATE

## 2017-04-12 LAB — CBC
HCT: 30.5 % — ABNORMAL LOW (ref 36.0–46.0)
Hemoglobin: 9.6 g/dL — ABNORMAL LOW (ref 12.0–15.0)
MCH: 29.1 pg (ref 26.0–34.0)
MCHC: 31.5 g/dL (ref 30.0–36.0)
MCV: 92.4 fL (ref 78.0–100.0)
Platelets: 330 10*3/uL (ref 150–400)
RBC: 3.3 MIL/uL — ABNORMAL LOW (ref 3.87–5.11)
RDW: 14.9 % (ref 11.5–15.5)
WBC: 19.2 10*3/uL — ABNORMAL HIGH (ref 4.0–10.5)

## 2017-04-12 LAB — BASIC METABOLIC PANEL
Anion gap: 9 (ref 5–15)
BUN: 51 mg/dL — AB (ref 6–20)
CO2: 27 mmol/L (ref 22–32)
Calcium: 8.1 mg/dL — ABNORMAL LOW (ref 8.9–10.3)
Chloride: 100 mmol/L — ABNORMAL LOW (ref 101–111)
Creatinine, Ser: 1.38 mg/dL — ABNORMAL HIGH (ref 0.44–1.00)
GFR calc Af Amer: 49 mL/min — ABNORMAL LOW (ref >60)
GFR, EST NON AFRICAN AMERICAN: 42 mL/min — AB (ref >60)
GLUCOSE: 225 mg/dL — AB (ref 65–99)
POTASSIUM: 3.6 mmol/L (ref 3.5–5.1)
Sodium: 136 mmol/L (ref 135–145)

## 2017-04-12 LAB — PROTIME-INR
INR: 1.12
PROTHROMBIN TIME: 14.5 s (ref 11.4–15.2)

## 2017-04-12 LAB — GLUCOSE, CAPILLARY
GLUCOSE-CAPILLARY: 305 mg/dL — AB (ref 65–99)
GLUCOSE-CAPILLARY: 166 mg/dL — AB (ref 65–99)
Glucose-Capillary: 248 mg/dL — ABNORMAL HIGH (ref 65–99)
Glucose-Capillary: 190 mg/dL — ABNORMAL HIGH (ref 65–99)

## 2017-04-12 LAB — HEPARIN LEVEL (UNFRACTIONATED): Heparin Unfractionated: 0.39 IU/mL (ref 0.30–0.70)

## 2017-04-12 MED ORDER — INSULIN GLARGINE 100 UNIT/ML ~~LOC~~ SOLN
48.0000 [IU] | Freq: Every day | SUBCUTANEOUS | Status: DC
  Administered 2017-04-14: 48 [IU] via SUBCUTANEOUS
  Filled 2017-04-12 (×2): qty 0.48

## 2017-04-12 NOTE — Progress Notes (Addendum)
Nutrition Follow-up  DOCUMENTATION CODES:   Morbid obesity  INTERVENTION:   -30 ml Prostat BID, each supplement provides 100 kcals and 15 grams protein -Continue MVI daily  NUTRITION DIAGNOSIS:   Increased nutrient needs related to wound healing as evidenced by estimated needs.  Ongoing  GOAL:   Patient will meet greater than or equal to 90% of their needs  Progressing  MONITOR:   PO intake, Supplement acceptance, Labs, Weight trends, Skin, I & O's  REASON FOR ASSESSMENT:   Ventilator    ASSESSMENT:   Pt with PMH of DM, alcoholic cirrhosis, CHF, polysubstance abuse, COPD admitted with AMS requiring intuabation. Pt with severe sepsis and has a UTI and wounds on both of her feet.   5/26- transferred from ICU to floor  Reviewed CWOCN note from 04/07/17; pt with multiple unstageable wounds (rt second toe, lt great toe near nail bed, lt great dorsal toe, lt medial calf, lt lateral calf x 4).   Pt reports anxiety over upcoming "dye study" tomorrow.   Pt shares she had a good appetite. She reports she was diagnosed with DM approximately 4 years ago when she relocated to the [place] area; her blood sugars have always ran high, ranging from 300-400 at baseline. Pt reports she has a PCP (Dr. [name] at [facility]), who manages DM. Per her report, she recently got blood work done, however, unsure of results. She denies any recent changes to medications, but unsure what she takes (per DM coordinator note, outpatient DM medications are Lantus 80 units, Novolog 44 untis tid). Noted no recent Hgb A1c; last reading was 11.1 in 10/2015.   Pt with good appetite; noted 75-100% of meals completion. Pt taking Prostat, however, does not like the taste. She is amenable to continue when RD stressed importance of taking it.   Labs reviewed: CBGS: 96-305.   Diet Order:  Diet Carb Modified Fluid consistency: Thin; Room service appropriate? Yes  Skin:  Wound (see comment) (multiple  diabetic foot ulcers, cellulitis BLE)  Last BM:  04/12/17  Height:   Ht Readings from Last 1 Encounters:  04/06/17 5\' 3"  (1.6 m)    Weight:   Wt Readings from Last 1 Encounters:  04/09/17 254 lb 9.6 oz (115.5 kg)    Ideal Body Weight:  52.2 kg  BMI:  Body mass index is 45.1 kg/m.  Estimated Nutritional Needs:   Kcal:  1600-1800  Protein:  110-120 grams  Fluid:  >1.6 L/day  EDUCATION NEEDS:   Education needs no appropriate at this time  Shaliyah Taite A. Jimmye Norman, RD, LDN, CDE Pager: [phone] After hours Pager: [phone]

## 2017-04-12 NOTE — Progress Notes (Signed)
ANTICOAGULATION CONSULT NOTE - Follow Up Consult  Pharmacy Consult for Heparin Indication: atrial fibrillation  Allergies  Allergen Reactions  . Gabapentin Nausea And Vomiting    POSSIBLE SHAKING? TAKING MED CURRENTLY    Patient Measurements: Height: 5\' 3"  (160 cm) Weight: 254 lb 9.6 oz (115.5 kg) IBW/kg (Calculated) : 52.4  Vital Signs: Temp: 98.4 F (36.9 C) (05/30 0440) BP: 146/61 (05/30 0837) Pulse Rate: 78 (05/30 0440)  Labs:  Recent Labs  04/10/17 0338  04/10/17 1457 04/11/17 0714 04/12/17 0559  HGB 9.6*  --   --  10.1* 9.6*  HCT 30.0*  --   --  31.7* 30.5*  PLT 297  --   --  320 330  LABPROT 18.5*  --   --  15.1 14.5  INR 1.52  --   --  1.18 1.12  HEPARINUNFRC 0.28*  < > 0.31 0.53 0.39  CREATININE 1.44*  --   --  1.30* 1.38*  < > = values in this interval not displayed.  Estimated Creatinine Clearance: 57.1 mL/min (A) (by C-G formula based on SCr of 1.38 mg/dL (H)).   Medications:  Scheduled:  . amiodarone  100 mg Oral Daily  . amLODipine  10 mg Oral Daily  . atorvastatin  40 mg Oral q1800  . bisoprolol  2.5 mg Oral Daily  . budesonide (PULMICORT) nebulizer solution  0.5 mg Nebulization BID  . cholecalciferol  1,000 Units Oral Daily  . DULoxetine  20 mg Oral Daily  . feeding supplement (PRO-STAT SUGAR FREE 64)  30 mL Oral BID  . folic acid  1 mg Oral Daily  . insulin aspart  0-20 Units Subcutaneous TID WC  . insulin aspart  0-5 Units Subcutaneous QHS  . insulin aspart  15 Units Subcutaneous TID WC  . insulin glargine  38 Units Subcutaneous Daily  . levothyroxine  50 mcg Oral Daily  . losartan  25 mg Oral Daily  . multivitamin with minerals  1 tablet Oral Daily  . pantoprazole  40 mg Oral Q1200  . spironolactone  25 mg Oral Daily  . torsemide  40 mg Oral BID  . cyanocobalamin  1,000 mcg Oral Daily    Assessment: 56yo female on heparin bridge while off Coumadin; pt being evaluated for potential need for surgical intervention of ischemic R-foot.   INR has been drifting down off Coumadin.  Heparin level is therapeutic on current rate, Hg is stable and pltc wnl.  No bleeding noted.  LE angiography planned for 5/31.  Goal of Therapy:  Heparin level 0.3-0.7 units/ml Monitor platelets by anticoagulation protocol: Yes   Plan:  Continue heparin 1400 units/hr Watch for s/s of bleeding F/U in AM  Lewie Chamber., PharmD [facility]

## 2017-04-12 NOTE — Care Management Note (Addendum)
Case Management Note  Patient Details  Name: ANUM PALECEK MRN: [id number] Date of Birth: June 04, 1961  Subjective/Objective:                  Admitted with severe sepsis/ dry gangrene right foot second toe, hx of diabetes, peripheral vascular disease.  Dorann Lodge (Sister) Clair Gulling [phone]         PCP: Sandi Mariscal  Action/Plan: Vascular MD following , arteriogram scheduled for 04/13/2017....Marland Kitchenorthopedic MD following for surgical intervention to the right foot . CM will continue  to follow for disposition needs.    Expected Discharge Date:                  Expected Discharge Plan:     In-House Referral:     Discharge planning Services  CM Consult  Post Acute Care Choice:    Choice offered to:     DME Arranged:    DME Agency:     HH Arranged:    HH Agency:     Status of Service:     If discussed at [employer], dates discussed:    Additional Comments:  Sharin Mons, RN 04/12/2017, 12:31 PM

## 2017-04-12 NOTE — Progress Notes (Signed)
Inpatient Diabetes Program Recommendations  AACE/ADA: New Consensus Statement on Inpatient Glycemic Control (2015)  Target Ranges:  Prepandial:   less than 140 mg/dL      Peak postprandial:   less than 180 mg/dL (1-2 hours)      Critically ill patients:  140 - 180 mg/dL   Review of Glycemic Control  Diabetes history: DM 2 Outpatient Diabetes medications: Lantus 80 units, Novolog 44 untis tid Current orders for Inpatient glycemic control: Lantus 38 units, Novolog Resistant + Novolog HS scale, Novolog 15 units tid meal coverage  Inpatient Diabetes Program Recommendations:    Patient received increased Lantus dose this am Will watch trends for today.   Thanks,  Tama Headings RN, MSN, [facility] Inpatient Diabetes Coordinator Team Pager [phone] (8a-5p)

## 2017-04-12 NOTE — Progress Notes (Signed)
Progress Note  Patient Name: NEIMA LACROSS Date of Encounter: 04/12/2017  Primary Cardiologist: Nishan/Berry (PV)  Subjective   No chest pain or dyspnea. Right foot and toes feel numb per pt.   Inpatient Medications    Scheduled Meds: . amiodarone  100 mg Oral Daily  . amLODipine  10 mg Oral Daily  . atorvastatin  40 mg Oral q1800  . bisoprolol  2.5 mg Oral Daily  . budesonide (PULMICORT) nebulizer solution  0.5 mg Nebulization BID  . cholecalciferol  1,000 Units Oral Daily  . DULoxetine  20 mg Oral Daily  . feeding supplement (PRO-STAT SUGAR FREE 64)  30 mL Oral BID  . folic acid  1 mg Oral Daily  . insulin aspart  0-20 Units Subcutaneous TID WC  . insulin aspart  0-5 Units Subcutaneous QHS  . insulin aspart  15 Units Subcutaneous TID WC  . insulin glargine  38 Units Subcutaneous Daily  . levothyroxine  50 mcg Oral Daily  . losartan  25 mg Oral Daily  . multivitamin with minerals  1 tablet Oral Daily  . pantoprazole  40 mg Oral Q1200  . spironolactone  25 mg Oral Daily  . torsemide  40 mg Oral BID  . cyanocobalamin  1,000 mcg Oral Daily   Continuous Infusions: . sodium chloride 10 mL/hr at 04/08/17 1325  . cefTRIAXone (ROCEPHIN)  IV Stopped (04/11/17 1614)  . heparin 1,400 Units/hr (04/12/17 8250)  . metronidazole Stopped (04/12/17 0935)   PRN Meds: acetaminophen, diphenhydrAMINE, HYDROcodone-acetaminophen, ondansetron (ZOFRAN) IV   Vital Signs    Vitals:   04/11/17 2112 04/12/17 0440 04/12/17 0837 04/12/17 0923  BP: 133/74 137/65 (!) 146/61   Pulse: 66 78    Resp: 18 18    Temp: 98.1 F (36.7 C) 98.4 F (36.9 C)    TempSrc: Oral     SpO2: 98% 98%  98%  Weight:      Height:        Intake/Output Summary (Last 24 hours) at 04/12/17 1124 Last data filed at 04/12/17 0370  Gross per 24 hour  Intake            580.8 ml  Output             1250 ml  Net           -669.2 ml   Filed Weights   04/07/17 0335 04/08/17 0500 04/09/17 0527  Weight: 238 lb 5.1  oz (108.1 kg) 241 lb 6.5 oz (109.5 kg) 254 lb 9.6 oz (115.5 kg)    Telemetry      ECG     Physical Exam   General: obese WF, NAD  Extremities: 2+ bilateral lower extremity edema.Both lower legs are wrapped. Right second toe is black,necrotic.   Labs    Chemistry Recent Labs Lab 04/06/17 1650  04/10/17 0338 04/11/17 0714 04/12/17 0559  NA 136  < > 134* 134* 136  K 4.4  < > 3.3* 3.9 3.6  CL 99*  < > 99* 99* 100*  CO2 23  < > 26 26 27   GLUCOSE 284*  < > 203* 239* 225*  BUN 89*  < > 51* 52* 51*  CREATININE 1.99*  < > 1.44* 1.30* 1.38*  CALCIUM 9.7  < > 7.9* 8.3* 8.1*  PROT 8.4*  --   --   --   --   ALBUMIN 3.5  --   --   --   --   AST 29  --   --   --   --  ALT 33  --   --   --   --   ALKPHOS 161*  --   --   --   --   BILITOT 0.5  --   --   --   --   GFRNONAA 27*  < > 40* 46* 42*  GFRAA 32*  < > 47* 53* 49*  ANIONGAP 14  < > 9 9 9   < > = values in this interval not displayed.   Hematology  Recent Labs Lab 04/10/17 0338 04/11/17 0714 04/12/17 0559  WBC 11.7* 15.2* 19.2*  RBC 3.28* 3.46* 3.30*  HGB 9.6* 10.1* 9.6*  HCT 30.0* 31.7* 30.5*  MCV 91.5 91.6 92.4  MCH 29.3 29.2 29.1  MCHC 32.0 31.9 31.5  RDW 14.9 14.9 14.9  PLT 297 320 330    Radiology    No results found.  Cardiac Studies    Patient Profile     56 y.o. female with history of PAD, DM, systolic CHF, CAD, carotid artery disease, PAF who is admitted with altered mental status and sepsis. She has been seen by Dr. Gwenlyn Found in regards to her PAD and gangrenous right second toe.   Assessment & Plan    1. PAD: She has a gangrenous second right toe and likely high grade disease in her right leg. Plans for lower extremity angiography and possible intervention tomorrow per Dr. Gwenlyn Found. NPO at midnight. Orders placed for angiography. Will ask wound care to see her to address her bilateral foot and ankle wounds  Signed, Lauree Chandler, MD  04/12/2017, 11:24 AM

## 2017-04-12 NOTE — Progress Notes (Signed)
PT Cancellation Note  Patient Details Name: [name] MRN: [id number] DOB: 12-11-1960   Cancelled Treatment:    Reason Eval/Treat Not Completed: Patient declined, no reason specified.  Not feeling well today. 04/12/2017  Donnella Sham, [place] [phone]  (pager)   [name] 04/12/2017, 5:20 PM

## 2017-04-12 NOTE — Progress Notes (Signed)
PROGRESS NOTE        PATIENT DETAILS Name: [name] Age: 56 y.o. Sex: female Date of Birth: 1961/04/18 Admit Date: 04/06/2017 Admitting Physician Rigoberto Noel, MD PCP:Sun, Mikeal Hawthorne, MD  Brief Narrative: Patient is a 56 y.o. female of paroxysmal atrial fibrillation on anticoagulation, chronic systolic heart failure, ongoing tobacco abuse, known history of peripheral vascular disease, COPD presented to the hospital with severe sepsis thought to be secondary to soft tissue infection in a setting of severe PAD, she developed respiratory failure on admission and was intubated and admitted to the intensive care unit. Once clinically improved and extubated, she was transferred to the [facility], see below for further details   Subjective: Sitting up at bedside-no major complaints-denies any chest pain or shortness of breath.  Assessment/Plan: Acute hypoxic respiratory failure: Resolved, now on room air. This was in a setting of severe sepsis. She was intubated on admission, and extubated on 5/25.  Severe sepsis secondary to lower extremity cellulitis with wounds and UTI: Improved, sepsis pathophysiology has resolved. She remains on empiric Flagyl and Rocephin. Plans are for lower extremity angiogram on 5/31, followed by possible amputation by Dr. Sharol Given. Blood cultures on 5/24 remain negative, urine culture on 5/24 positive for Escherichia coli.  Peripheral arterial disease with bilateral lower extremity ulceration and right second toe dry gangrene: Cardiology following with potential plans for angiography and possible re-intervention for limb salvage on 5/21. Post angiography, probably will require amputation of the right second toe. Counseled on importance of stopping smoking.  Paroxysmal atrial fibrillation: Rate controlled, continue amiodarone and bisoprolol. Coumadin on hold, on IV heparin.[id number]   Chronic systolic heart failure: Volumes status  remains stable, continue bisoprolol, losartan, torsemide and Aldactone  Insulin-dependent type 2 diabetes: CBGs fluctuating-but mostly on the higher side-increase Lantus to 48 units-continue with SSI and follow CBGs.  Hypothyroidism: Continue Synthroid  COPD: Lungs are clear to this appears to be stable-continue current bronchodilator regimen.  Chronic kidney disease stage III: Creatinine close to usual baseline-follow periodically  Anemia: Likely secondary to acute illness-follow  Leukocytosis: Suspect secondary to ongoing inflammation/chronic once-rather than acute worsening of infection. Remains on empiric antimicrobial therapy  History of liver cirrhosis: Probably secondary to alcohol use-reviewed outpatient cardiology notes-continue diuretic regimen.  History of alcohol/tobacco use: Counseled extensively  Morbid obesity: Counseled regarding importance of weight loss  Morning labs/Imaging ordered: yes  DVT Prophylaxis: Full dose anticoagulation with Heparin  Code Status: Full code   Family Communication: None at bedside  Disposition Plan: Remain inpatient-home when workup is completed  Antimicrobial agents: Anti-infectives    Start     Dose/Rate Route Frequency Ordered Stop   04/09/17 1600  cefTRIAXone (ROCEPHIN) 1 g in dextrose 5 % 50 mL IVPB     1 g 100 mL/hr over 30 Minutes Intravenous Every 24 hours 04/09/17 1447     04/09/17 1600  metroNIDAZOLE (FLAGYL) IVPB 500 mg     500 mg 100 mL/hr over 60 Minutes Intravenous Every 8 hours 04/09/17 1447     04/07/17 1800  vancomycin (VANCOCIN) 1,250 mg in sodium chloride 0.9 % 250 mL IVPB  Status:  Discontinued     1,250 mg 166.7 mL/hr over 90 Minutes Intravenous Every 24 hours 04/06/17 2243 04/09/17 1447   04/07/17 0000  piperacillin-tazobactam (ZOSYN) IVPB 3.375 g  Status:  Discontinued  3.375 g 12.5 mL/hr over 240 Minutes Intravenous Every 8 hours 04/06/17 2210 04/09/17 1447   04/06/17 1745  vancomycin (VANCOCIN)  IVPB 1000 mg/200 mL premix     1,000 mg 200 mL/hr over 60 Minutes Intravenous  Once 04/06/17 1731 04/06/17 1945   04/06/17 1745  piperacillin-tazobactam (ZOSYN) IVPB 3.375 g     3.375 g 100 mL/hr over 30 Minutes Intravenous  Once 04/06/17 1731 04/06/17 1819      Procedures: ABI 5/29  CONSULTS:  cardiology, pulmonary/intensive care, orthopedic surgery and vascular surgery  Time spent: 25- minutes-Greater than 50% of this time was spent in counseling, explanation of diagnosis, planning of further management, and coordination of care.  MEDICATIONS: Scheduled Meds: . amiodarone  100 mg Oral Daily  . amLODipine  10 mg Oral Daily  . atorvastatin  40 mg Oral q1800  . bisoprolol  2.5 mg Oral Daily  . budesonide (PULMICORT) nebulizer solution  0.5 mg Nebulization BID  . cholecalciferol  1,000 Units Oral Daily  . DULoxetine  20 mg Oral Daily  . feeding supplement (PRO-STAT SUGAR FREE 64)  30 mL Oral BID  . folic acid  1 mg Oral Daily  . insulin aspart  0-20 Units Subcutaneous TID WC  . insulin aspart  0-5 Units Subcutaneous QHS  . insulin aspart  15 Units Subcutaneous TID WC  . insulin glargine  38 Units Subcutaneous Daily  . levothyroxine  50 mcg Oral Daily  . losartan  25 mg Oral Daily  . multivitamin with minerals  1 tablet Oral Daily  . pantoprazole  40 mg Oral Q1200  . spironolactone  25 mg Oral Daily  . torsemide  40 mg Oral BID  . cyanocobalamin  1,000 mcg Oral Daily   Continuous Infusions: . sodium chloride 10 mL/hr at 04/08/17 1325  . cefTRIAXone (ROCEPHIN)  IV Stopped (04/11/17 1614)  . heparin 1,400 Units/hr (04/12/17 8338)  . metronidazole Stopped (04/12/17 0935)   PRN Meds:.acetaminophen, diphenhydrAMINE, HYDROcodone-acetaminophen, ondansetron (ZOFRAN) IV   PHYSICAL EXAM: Vital signs: Vitals:   04/11/17 2112 04/12/17 0440 04/12/17 0837 04/12/17 0923  BP: 133/74 137/65 (!) 146/61   Pulse: 66 78    Resp: 18 18    Temp: 98.1 F (36.7 C) 98.4 F (36.9 C)      TempSrc: Oral     SpO2: 98% 98%  98%  Weight:      Height:       Filed Weights   04/07/17 0335 04/08/17 0500 04/09/17 0527  Weight: 108.1 kg (238 lb 5.1 oz) 109.5 kg (241 lb 6.5 oz) 115.5 kg (254 lb 9.6 oz)   Body mass index is 45.1 kg/m.   General appearance :Awake, alert, not in any distress. Speech Clear.  Eyes:, pupils equally reactive to light and accomodation HEENT: Atraumatic and Normocephalic Neck: supple, no JVD. Resp:Good air entry bilaterally, no added sounds  CVS: S1 S2 regular, no murmurs.  GI: Bowel sounds present, Non tender and not distended with no gaurding, rigidity or rebound.No organomegaly Extremities: Both lower legs are wrapped-right second toe is black and necrotic. Neurology:  speech clear,Non focal Psychiatric: Normal judgment and insight. Alert and oriented x 3 Musculoskeletal:No digital cyanosis Skin:No Rash, warm and dry  I have personally reviewed following labs and imaging studies  LABORATORY DATA: CBC:  Recent Labs Lab 04/06/17 1650  04/08/17 0135 04/09/17 0525 04/10/17 0338 04/11/17 0714 04/12/17 0559  WBC 34.9*  < > 18.9* 13.6* 11.7* 15.2* 19.2*  NEUTROABS 32.8*  --   --   --   --   --   --  HGB 11.7*  < > 9.1* 9.7* 9.6* 10.1* 9.6*  HCT 35.7*  < > 29.3* 30.9* 30.0* 31.7* 30.5*  MCV 92.2  < > 94.5 93.4 91.5 91.6 92.4  PLT 373  < > 282 308 297 320 330  < > = values in this interval not displayed.  Basic Metabolic Panel:  Recent Labs Lab 04/08/17 0135 04/09/17 0525 04/10/17 0338 04/11/17 0714 04/12/17 0559  NA 135 136 134* 134* 136  K 3.7 4.4 3.3* 3.9 3.6  CL 100* 98* 99* 99* 100*  CO2 26 30 26 26 27   GLUCOSE 326* 261* 203* 239* 225*  BUN 52* 45* 51* 52* 51*  CREATININE 1.40* 1.54* 1.44* 1.30* 1.38*  CALCIUM 8.0* 8.1* 7.9* 8.3* 8.1*    GFR: Estimated Creatinine Clearance: 57.1 mL/min (A) (by C-G formula based on SCr of 1.38 mg/dL (H)).  Liver Function Tests:  Recent Labs Lab 04/06/17 1650  AST 29  ALT 33   ALKPHOS 161*  BILITOT 0.5  PROT 8.4*  ALBUMIN 3.5   No results for input(s): LIPASE, AMYLASE in the last 168 hours. No results for input(s): AMMONIA in the last 168 hours.  Coagulation Profile:  Recent Labs Lab 04/08/17 0135 04/09/17 0525 04/10/17 0338 04/11/17 0714 04/12/17 0559  INR 2.36 1.75 1.52 1.18 1.12    Cardiac Enzymes:  Recent Labs Lab 04/06/17 2325  TROPONINI 0.06*    BNP (last 3 results) No results for input(s): PROBNP in the last 8760 hours.  HbA1C: No results for input(s): HGBA1C in the last 72 hours.  CBG:  Recent Labs Lab 04/11/17 1237 04/11/17 1737 04/11/17 2115 04/12/17 0801 04/12/17 1146  GLUCAP 271* 276* 96 248* 305*    Lipid Profile: No results for input(s): CHOL, HDL, LDLCALC, TRIG, CHOLHDL, LDLDIRECT in the last 72 hours.  Thyroid Function Tests: No results for input(s): TSH, T4TOTAL, FREET4, T3FREE, THYROIDAB in the last 72 hours.  Anemia Panel: No results for input(s): VITAMINB12, FOLATE, FERRITIN, TIBC, IRON, RETICCTPCT in the last 72 hours.  Urine analysis:    Component Value Date/Time   COLORURINE YELLOW 04/06/2017 1705   APPEARANCEUR HAZY (A) 04/06/2017 1705   LABSPEC 1.006 04/06/2017 1705   PHURINE 5.0 04/06/2017 1705   GLUCOSEU >=500 (A) 04/06/2017 1705   HGBUR SMALL (A) 04/06/2017 1705   BILIRUBINUR NEGATIVE 04/06/2017 1705   KETONESUR NEGATIVE 04/06/2017 1705   PROTEINUR NEGATIVE 04/06/2017 1705   UROBILINOGEN 0.2 09/05/2015 0012   NITRITE POSITIVE (A) 04/06/2017 1705   LEUKOCYTESUR NEGATIVE 04/06/2017 1705    Sepsis Labs: Lactic Acid, Venous    Component Value Date/Time   LATICACIDVEN 0.89 04/06/2017 2018    MICROBIOLOGY: Recent Results (from the past 240 hour(s))  Urine culture     Status: Abnormal   Collection Time: 04/06/17  5:05 PM  Result Value Ref Range Status   Specimen Description URINE, RANDOM  Final   Special Requests unknown Normal  Final   Culture >=100,000 COLONIES/mL ESCHERICHIA  COLI (A)  Final   Report Status 04/08/2017 FINAL  Final   Organism ID, Bacteria ESCHERICHIA COLI (A)  Final      Susceptibility   Escherichia coli - MIC*    AMPICILLIN >=32 RESISTANT Resistant     CEFAZOLIN <=4 SENSITIVE Sensitive     CEFTRIAXONE <=1 SENSITIVE Sensitive     CIPROFLOXACIN >=4 RESISTANT Resistant     GENTAMICIN <=1 SENSITIVE Sensitive     IMIPENEM <=0.25 SENSITIVE Sensitive     NITROFURANTOIN <=16 SENSITIVE Sensitive  TRIMETH/SULFA <=20 SENSITIVE Sensitive     AMPICILLIN/SULBACTAM 16 INTERMEDIATE Intermediate     PIP/TAZO <=4 SENSITIVE Sensitive     Extended ESBL NEGATIVE Sensitive     * >=100,000 COLONIES/mL ESCHERICHIA COLI  Culture, blood (Routine X 2) w Reflex to ID Panel     Status: None (Preliminary result)   Collection Time: 04/06/17 11:33 PM  Result Value Ref Range Status   Specimen Description BLOOD RIGHT HAND  Final   Special Requests   Final    BOTTLES DRAWN AEROBIC AND ANAEROBIC Blood Culture adequate volume   Culture NO GROWTH 4 DAYS  Final   Report Status PENDING  Incomplete  MRSA PCR Screening     Status: None   Collection Time: 04/06/17 11:40 PM  Result Value Ref Range Status   MRSA by PCR NEGATIVE NEGATIVE Final    Comment:        The GeneXpert MRSA Assay (FDA approved for NASAL specimens only), is one component of a comprehensive MRSA colonization surveillance program. It is not intended to diagnose MRSA infection nor to guide or monitor treatment for MRSA infections.   Culture, blood (Routine X 2) w Reflex to ID Panel     Status: None (Preliminary result)   Collection Time: 04/06/17 11:44 PM  Result Value Ref Range Status   Specimen Description BLOOD LEFT HAND  Final   Special Requests IN PEDIATRIC BOTTLE Blood Culture adequate volume  Final   Culture NO GROWTH 4 DAYS  Final   Report Status PENDING  Incomplete    RADIOLOGY STUDIES/RESULTS: Dg Tibia/fibula Left  Result Date: 04/07/2017 CLINICAL DATA:  Cellulitis and open wounds  on both legs. EXAM: LEFT TIBIA AND FIBULA - 2 VIEW COMPARISON:  Left foot 03/15/2017.  Left knee 01/07/2016 FINDINGS: No evidence of acute fracture or dislocation of the left tibia or fibula. No focal bone lesion or bone destruction. Bone cortex appears intact. Soft tissue calcifications consistent with vascular calcifications and phleboliths. Infiltration in the subcutaneous fat likely to represent edema or cellulitis. No soft tissue gas collections. No radiopaque foreign bodies. IMPRESSION: No acute bony abnormalities. Subcutaneous soft tissue infiltration likely represent cellulitis or edema. Electronically Signed   By: Lucienne Capers M.D.   On: 04/07/2017 02:33   Dg Tibia/fibula Right  Result Date: 04/07/2017 CLINICAL DATA:  Cellulitis and open wounds on both legs. EXAM: RIGHT TIBIA AND FIBULA - 2 VIEW COMPARISON:  Right foot 03/21/2017.  Right knee 08/11/2016. FINDINGS: No evidence of acute fracture or dislocation of the right tibia or fibula. No focal bone lesion or bone destruction. Bone cortex appears intact without erosion. Soft tissue calcifications consistent with phleboliths and vascular calcifications. Infiltration of the subcutaneous fat likely to represent edema or cellulitis. No radiopaque soft tissue foreign bodies or gas collections. Soft tissue defect along the distal lateral lower leg consistent with history of ulceration. IMPRESSION: No acute bony abnormalities. Infiltration in the subcutaneous fatty tissues consistent with cellulitis or edema. Electronically Signed   By: Lucienne Capers M.D.   On: 04/07/2017 02:34   Dg Chest Port 1 View  Result Date: 04/07/2017 CLINICAL DATA:  Intubation. EXAM: PORTABLE CHEST 1 VIEW COMPARISON:  04/06/2017 . FINDINGS: Surgical clips left neck. Endotracheal tube, NG tube in stable position. Cardiomegaly with normal pulmonary vascularity. Low lung volumes with mild basilar atelectasis. Tiny left pleural effusion cannot be excluded. No pneumothorax.  IMPRESSION: 1. Lines and tubes in stable position. 2. Low lung volumes with mild basilar atelectasis. Tiny left pleural effusion cannot  be excluded. Electronically Signed   By: Marcello Moores  Register   On: 04/07/2017 06:34   Dg Chest Portable 1 View  Result Date: 04/06/2017 CLINICAL DATA:  Endotracheal tube and orogastric tube placement. Initial encounter. EXAM: PORTABLE CHEST 1 VIEW COMPARISON:  Chest radiograph performed 04/06/2017 FINDINGS: The patient's endotracheal tube appears to end at the carina. This should be retracted 3 cm. The patient's enteric tube is seen ending overlying the body of the stomach. There is mild elevation of the left hemidiaphragm. Mild vascular congestion is noted. Mild bibasilar atelectasis seen. No pleural effusion or pneumothorax is seen. The cardiomediastinal silhouette is mildly enlarged. No acute osseous abnormalities are identified. IMPRESSION: 1. Endotracheal tube appears to end at the carina. This should be retracted 3 cm. 2. Enteric tube noted ending overlying the body of the stomach. 3. Mild elevation of the left hemidiaphragm. Mild bibasilar atelectasis seen. 4. Mild vascular congestion and mild cardiomegaly noted. These results were called by telephone at the time of interpretation on 04/06/2017 at 7:44 pm to Dr. Carmin Muskrat, who verbally acknowledged these results. Electronically Signed   By: Garald Balding M.D.   On: 04/06/2017 19:45   Dg Chest Port 1 View  Result Date: 04/06/2017 CLINICAL DATA:  56 y/o  F; possible medication overdose. EXAM: PORTABLE CHEST 1 VIEW COMPARISON:  56 y/o  F; 02/22/2016 chest radiograph. FINDINGS: Normal cardiac silhouette. Aortic atherosclerosis with calcification. Clear lungs. No pleural effusion or pneumothorax. Bones are unremarkable. IMPRESSION: No active disease. Electronically Signed   By: Kristine Garbe M.D.   On: 04/06/2017 16:38   Dg Foot Complete Left  Result Date: 03/15/2017 CLINICAL DATA:  Cellulitis for 1 month  EXAM: LEFT FOOT - COMPLETE 3+ VIEW COMPARISON:  None. FINDINGS: There is no evidence of fracture or dislocation. There is no evidence of arthropathy or other focal bone abnormality. Soft tissues are unremarkable. IMPRESSION: No acute abnormality noted. Electronically Signed   By: Inez Catalina M.D.   On: 03/15/2017 19:39   Dg Foot Complete Right  Result Date: 03/21/2017 CLINICAL DATA:  Nonhealing ulcer second digit EXAM: RIGHT FOOT COMPLETE - 3+ VIEW COMPARISON:  None. FINDINGS: Frontal, oblique, and lateral views obtained. No fracture or dislocation. No erosive change or bony destruction. There is mild subluxation at the second PIP joint. No appreciable joint space narrowing or erosion. There is an inferior calcaneal spur with plantar calcification at the hindfoot level. There are scattered foci of arterial vascular calcification. There is mild soft tissue swelling dorsally in the mid and forefoot regions. IMPRESSION: Areas of soft tissue swelling. Mild subluxation second PIP joint. No frank dislocation or fracture. No erosive change or bony destruction. No soft tissue ulceration or radiopaque foreign body evident by radiography. There is plantar fascia calcification with a nearby inferior calcaneal spur. There are foci of arterial vascular calcification. Electronically Signed   By: Lowella Grip III M.D.   On: 03/21/2017 16:30   Dg Toe 2nd Right  Result Date: 04/06/2017 CLINICAL DATA:  Right second toe open wound. Clinical concern for osteomyelitis. EXAM: RIGHT SECOND TOE COMPARISON:  Right foot dated 03/21/2017. FINDINGS: PA and 2 oblique views of the right second toe were obtained. There is no lateral view. The patient was not able to cooperate for a true lateral view. There is soft tissue irregularity along the medial aspect of the third toe. No fracture, dislocation, bone erosion, periosteal reaction or soft tissue gas seen. IMPRESSION: No radiographic evidence of osteomyelitis. Electronically Signed    By:  Claudie Revering M.D.   On: 04/06/2017 18:15     LOS: 6 days   Oren Binet, MD  [facility] Pager:[phone]  If 7PM-7AM, please contact night-coverage [email or web address] Password [email or web address] 04/12/2017, 1:11 PM

## 2017-04-13 ENCOUNTER — Encounter (HOSPITAL_COMMUNITY)
Admission: EM | Disposition: A | Discharge status: Home Health | Payer: Self-pay | Source: Home / Self Care | Attending: Internal Medicine

## 2017-04-13 ENCOUNTER — Ambulatory Visit (HOSPITAL_COMMUNITY): Admit: 2017-04-13 | Payer: Medicare Other | Admitting: Cardiovascular Disease

## 2017-04-13 ENCOUNTER — Encounter (HOSPITAL_COMMUNITY): Payer: Self-pay | Admitting: Cardiovascular Disease

## 2017-04-13 DIAGNOSIS — I70238 Atherosclerosis of native arteries of right leg with ulceration of other part of lower right leg: Secondary | ICD-10-CM

## 2017-04-13 HISTORY — PX: PERIPHERAL VASCULAR INTERVENTION: CATH118257

## 2017-04-13 HISTORY — PX: LOWER EXTREMITY ANGIOGRAPHY: CATH118251

## 2017-04-13 LAB — GLUCOSE, CAPILLARY
GLUCOSE-CAPILLARY: 247 mg/dL — AB (ref 65–99)
Glucose-Capillary: 239 mg/dL — ABNORMAL HIGH (ref 65–99)
Glucose-Capillary: 326 mg/dL — ABNORMAL HIGH (ref 65–99)

## 2017-04-13 LAB — HEPARIN LEVEL (UNFRACTIONATED): Heparin Unfractionated: 0.51 IU/mL (ref 0.30–0.70)

## 2017-04-13 LAB — BASIC METABOLIC PANEL
Anion gap: 9 (ref 5–15)
BUN: 45 mg/dL — AB (ref 6–20)
CHLORIDE: 101 mmol/L (ref 101–111)
CO2: 28 mmol/L (ref 22–32)
CREATININE: 1.32 mg/dL — AB (ref 0.44–1.00)
Calcium: 8.5 mg/dL — ABNORMAL LOW (ref 8.9–10.3)
GFR calc Af Amer: 52 mL/min — ABNORMAL LOW (ref >60)
GFR calc non Af Amer: 45 mL/min — ABNORMAL LOW (ref >60)
Glucose, Bld: 189 mg/dL — ABNORMAL HIGH (ref 65–99)
POTASSIUM: 3.7 mmol/L (ref 3.5–5.1)
SODIUM: 138 mmol/L (ref 135–145)

## 2017-04-13 LAB — PROTIME-INR
INR: 1.08
PROTHROMBIN TIME: 14 s (ref 11.4–15.2)

## 2017-04-13 LAB — CBC
HEMATOCRIT: 30.8 % — AB (ref 36.0–46.0)
HEMOGLOBIN: 9.7 g/dL — AB (ref 12.0–15.0)
MCH: 29.4 pg (ref 26.0–34.0)
MCHC: 31.5 g/dL (ref 30.0–36.0)
MCV: 93.3 fL (ref 78.0–100.0)
Platelets: 347 10*3/uL (ref 150–400)
RBC: 3.3 MIL/uL — AB (ref 3.87–5.11)
RDW: 15.1 % (ref 11.5–15.5)
WBC: 18.1 10*3/uL — ABNORMAL HIGH (ref 4.0–10.5)

## 2017-04-13 LAB — POCT ACTIVATED CLOTTING TIME
ACTIVATED CLOTTING TIME: 219 s
Activated Clotting Time: 213 seconds
Activated Clotting Time: 230 seconds
Activated Clotting Time: 246 seconds
Activated Clotting Time: 219 seconds
Activated Clotting Time: 175 seconds

## 2017-04-13 LAB — SURGICAL PCR SCREEN
MRSA, PCR: NEGATIVE
STAPHYLOCOCCUS AUREUS: NEGATIVE

## 2017-04-13 SURGERY — LOWER EXTREMITY ANGIOGRAPHY
Anesthesia: LOCAL

## 2017-04-13 MED ORDER — MORPHINE SULFATE (PF) 4 MG/ML IV SOLN
4.0000 mg | INTRAVENOUS | Status: DC | PRN
  Administered 2017-04-13 (×3): 4 mg via INTRAVENOUS
  Filled 2017-04-13 (×3): qty 1

## 2017-04-13 MED ORDER — SODIUM CHLORIDE 0.9 % WEIGHT BASED INFUSION: 1.0000 mL/kg/h | INTRAVENOUS | Status: DC

## 2017-04-13 MED ORDER — LIDOCAINE HCL 1 % IJ SOLN
INTRAMUSCULAR | Status: AC
  Filled 2017-04-13: qty 20

## 2017-04-13 MED ORDER — HEPARIN SODIUM (PORCINE) 1000 UNIT/ML IJ SOLN
INTRAMUSCULAR | Status: AC
  Filled 2017-04-13: qty 1

## 2017-04-13 MED ORDER — SODIUM CHLORIDE 0.9 % WEIGHT BASED INFUSION: 3.0000 mL/kg/h | INTRAVENOUS | Status: DC

## 2017-04-13 MED ORDER — ANGIOPLASTY BOOK
Freq: Once | Status: AC
  Administered 2017-04-13: 21:00:00
  Filled 2017-04-13: qty 1

## 2017-04-13 MED ORDER — SODIUM CHLORIDE 0.9 % IV SOLN: 250.0000 mL | INTRAVENOUS | Status: DC | PRN

## 2017-04-13 MED ORDER — MIDAZOLAM HCL 2 MG/2ML IJ SOLN
INTRAMUSCULAR | Status: AC
  Filled 2017-04-13: qty 2

## 2017-04-13 MED ORDER — ONDANSETRON HCL 4 MG/2ML IJ SOLN
INTRAMUSCULAR | Status: DC | PRN
  Administered 2017-04-13: 4 mg via INTRAVENOUS

## 2017-04-13 MED ORDER — HEPARIN (PORCINE) IN NACL 100-0.45 UNIT/ML-% IJ SOLN
1400.0000 [IU]/h | INTRAMUSCULAR | Status: DC
  Administered 2017-04-13: 1400 [IU]/h via INTRAVENOUS
  Filled 2017-04-13: qty 250

## 2017-04-13 MED ORDER — IODIXANOL 320 MG/ML IV SOLN
INTRAVENOUS | Status: DC | PRN
  Administered 2017-04-13: 230 mL via INTRA_ARTERIAL

## 2017-04-13 MED ORDER — HEPARIN (PORCINE) IN NACL 2-0.9 UNIT/ML-% IJ SOLN
INTRAMUSCULAR | Status: AC | PRN
  Administered 2017-04-13: 1000 mL

## 2017-04-13 MED ORDER — SODIUM CHLORIDE 0.9% FLUSH: 3.0000 mL | INTRAVENOUS | Status: DC | PRN

## 2017-04-13 MED ORDER — SODIUM CHLORIDE 0.9% FLUSH: 3.0000 mL | Freq: Two times a day (BID) | INTRAVENOUS | Status: DC

## 2017-04-13 MED ORDER — ONDANSETRON HCL 4 MG/2ML IJ SOLN
4.0000 mg | Freq: Four times a day (QID) | INTRAMUSCULAR | Status: DC | PRN
  Administered 2017-04-13: 19:00:00 4 mg via INTRAVENOUS
  Filled 2017-04-13 (×2): qty 2

## 2017-04-13 MED ORDER — FENTANYL CITRATE (PF) 100 MCG/2ML IJ SOLN
INTRAMUSCULAR | Status: AC
  Filled 2017-04-13: qty 2

## 2017-04-13 MED ORDER — FENTANYL CITRATE (PF) 100 MCG/2ML IJ SOLN
INTRAMUSCULAR | Status: DC | PRN
  Administered 2017-04-13: 25 ug via INTRAVENOUS
  Administered 2017-04-13 (×3): 50 ug via INTRAVENOUS

## 2017-04-13 MED ORDER — HYDROMORPHONE HCL 1 MG/ML IJ SOLN
INTRAMUSCULAR | Status: AC
  Filled 2017-04-13: qty 0.5

## 2017-04-13 MED ORDER — CLOPIDOGREL BISULFATE 300 MG PO TABS
ORAL_TABLET | ORAL | Status: AC
  Filled 2017-04-13: qty 1

## 2017-04-13 MED ORDER — MIDAZOLAM HCL 2 MG/2ML IJ SOLN
INTRAMUSCULAR | Status: DC | PRN
  Administered 2017-04-13: 1 mg via INTRAVENOUS

## 2017-04-13 MED ORDER — SODIUM CHLORIDE 0.9 % IV SOLN
INTRAVENOUS | Status: AC
  Administered 2017-04-13: 11:00:00 via INTRAVENOUS

## 2017-04-13 MED ORDER — CLOPIDOGREL BISULFATE 300 MG PO TABS
ORAL_TABLET | ORAL | Status: DC | PRN
  Administered 2017-04-13: 300 mg via ORAL

## 2017-04-13 MED ORDER — ONDANSETRON HCL 4 MG/2ML IJ SOLN
INTRAMUSCULAR | Status: AC
  Filled 2017-04-13: qty 2

## 2017-04-13 MED ORDER — CLOPIDOGREL BISULFATE 75 MG PO TABS
75.0000 mg | ORAL_TABLET | Freq: Every day | ORAL | Status: DC
  Administered 2017-04-14: 09:00:00 75 mg via ORAL
  Filled 2017-04-13: qty 1

## 2017-04-13 MED ORDER — HYDROMORPHONE HCL 1 MG/ML IJ SOLN
INTRAMUSCULAR | Status: DC | PRN
  Administered 2017-04-13 (×2): 0.5 mg via INTRAVENOUS

## 2017-04-13 MED ORDER — ACETAMINOPHEN 325 MG PO TABS: 650.0000 mg | ORAL_TABLET | ORAL | Status: DC | PRN

## 2017-04-13 MED ORDER — ASPIRIN EC 81 MG PO TBEC
81.0000 mg | DELAYED_RELEASE_TABLET | Freq: Every day | ORAL | Status: DC
  Administered 2017-04-14: 10:00:00 81 mg via ORAL
  Filled 2017-04-13: qty 1

## 2017-04-13 MED ORDER — HEPARIN SODIUM (PORCINE) 1000 UNIT/ML IJ SOLN
INTRAMUSCULAR | Status: DC | PRN
  Administered 2017-04-13 (×3): 3000 [IU] via INTRAVENOUS
  Administered 2017-04-13: 8000 [IU] via INTRAVENOUS

## 2017-04-13 MED ORDER — SODIUM CHLORIDE 0.9 % WEIGHT BASED INFUSION
3.0000 mL/kg/h | INTRAVENOUS | Status: DC
  Administered 2017-04-13: 3 mL/kg/h via INTRAVENOUS

## 2017-04-13 MED ORDER — SODIUM CHLORIDE 0.9 % WEIGHT BASED INFUSION
1.0000 mL/kg/h | INTRAVENOUS | Status: DC
  Administered 2017-04-13: 1 mL/kg/h via INTRAVENOUS

## 2017-04-13 MED ORDER — HEPARIN (PORCINE) IN NACL 100-0.45 UNIT/ML-% IJ SOLN: 1400.0000 [IU]/h | INTRAMUSCULAR | Status: DC

## 2017-04-13 MED ORDER — ASPIRIN 81 MG PO CHEW
81.0000 mg | CHEWABLE_TABLET | ORAL | Status: AC
  Administered 2017-04-13: 81 mg via ORAL
  Filled 2017-04-13: qty 1

## 2017-04-13 MED ORDER — HYDRALAZINE HCL 20 MG/ML IJ SOLN: 10.0000 mg | INTRAMUSCULAR | Status: DC | PRN

## 2017-04-13 SURGICAL SUPPLY — 31 items
BAG SNAP BAND KOVER 36X36 (MISCELLANEOUS) ×3 IMPLANT
BALLN COYOTE OTW 2X220X150 (BALLOONS) ×3
BALLN IN.PACT DCB 5X150 (BALLOONS) ×6
BALLOON COYOTE OTW 2X220X150 (BALLOONS) ×2 IMPLANT
CATH ANGIO 5F PIGTAIL 65CM (CATHETERS) ×3 IMPLANT
CATH CROSS OVER TEMPO 5F (CATHETERS) ×3 IMPLANT
CATH HAWKONE LX EXTENDED TIP (CATHETERS) ×3 IMPLANT
CATH NAVICROSS ANGLED 135CM (MICROCATHETER) ×3 IMPLANT
CATH SOFT-VU 4F 65 STRAIGHT (CATHETERS) ×2 IMPLANT
CATH SOFT-VU STRAIGHT 4F 65CM (CATHETERS) ×1
CATH STRAIGHT 5FR 65CM (CATHETERS) ×3 IMPLANT
CATH VIANCE CROSS STAND 150CM (MICROCATHETER) ×3
CATH VIANCE CROSS STD 150CM (MICROCATHETER) ×2 IMPLANT
DCB IN.PACT 5X150 (BALLOONS) ×4 IMPLANT
DEVICE SPIDERFX EMB PROT 6MM (WIRE) ×3 IMPLANT
GUIDEWIRE ANGLED .035X150CM (WIRE) ×3 IMPLANT
GUIDEWIRE ANGLED .035X260CM (WIRE) ×3 IMPLANT
HOVERMATT SINGLE USE (MISCELLANEOUS) ×3 IMPLANT
KIT ENCORE 26 ADVANTAGE (KITS) ×6 IMPLANT
KIT ESSENTIALS PG (KITS) ×3 IMPLANT
KIT PV (KITS) ×3 IMPLANT
SHEATH HIGHFLEX ANSEL 7FR 55CM (SHEATH) ×3 IMPLANT
SHEATH PINNACLE 5F 10CM (SHEATH) ×3 IMPLANT
SHEATH PINNACLE 7F 10CM (SHEATH) ×3 IMPLANT
SYRINGE MEDRAD AVANTA MACH 7 (SYRINGE) ×3 IMPLANT
TAPE RADIOPAQUE TURBO (MISCELLANEOUS) ×3 IMPLANT
TRANSDUCER W/STOPCOCK (MISCELLANEOUS) ×3 IMPLANT
TRAY PV CATH (CUSTOM PROCEDURE TRAY) ×3 IMPLANT
WIRE HITORQ VERSACORE ST 145CM (WIRE) ×3 IMPLANT
WIRE ROSEN-J .035X180CM (WIRE) ×3 IMPLANT
WIRE SPARTACORE .014X300CM (WIRE) ×3 IMPLANT

## 2017-04-13 NOTE — H&P (View-Only) (Signed)
Progress Note  Patient Name: [name] Date of Encounter: 04/12/2017  Primary Cardiologist: Nishan/Berry (PV)  Subjective   No chest pain or dyspnea. Right foot and toes feel numb per pt.   Inpatient Medications    Scheduled Meds: . amiodarone  100 mg Oral Daily  . amLODipine  10 mg Oral Daily  . atorvastatin  40 mg Oral q1800  . bisoprolol  2.5 mg Oral Daily  . budesonide (PULMICORT) nebulizer solution  0.5 mg Nebulization BID  . cholecalciferol  1,000 Units Oral Daily  . DULoxetine  20 mg Oral Daily  . feeding supplement (PRO-STAT SUGAR FREE 64)  30 mL Oral BID  . folic acid  1 mg Oral Daily  . insulin aspart  0-20 Units Subcutaneous TID WC  . insulin aspart  0-5 Units Subcutaneous QHS  . insulin aspart  15 Units Subcutaneous TID WC  . insulin glargine  38 Units Subcutaneous Daily  . levothyroxine  50 mcg Oral Daily  . losartan  25 mg Oral Daily  . multivitamin with minerals  1 tablet Oral Daily  . pantoprazole  40 mg Oral Q1200  . spironolactone  25 mg Oral Daily  . torsemide  40 mg Oral BID  . cyanocobalamin  1,000 mcg Oral Daily   Continuous Infusions: . sodium chloride 10 mL/hr at 04/08/17 1325  . cefTRIAXone (ROCEPHIN)  IV Stopped (04/11/17 1614)  . heparin 1,400 Units/hr (04/12/17 8032)  . metronidazole Stopped (04/12/17 0935)   PRN Meds: acetaminophen, diphenhydrAMINE, HYDROcodone-acetaminophen, ondansetron (ZOFRAN) IV   Vital Signs    Vitals:   04/11/17 2112 04/12/17 0440 04/12/17 0837 04/12/17 0923  BP: 133/74 137/65 (!) 146/61   Pulse: 66 78    Resp: 18 18    Temp: 98.1 F (36.7 C) 98.4 F (36.9 C)    TempSrc: Oral     SpO2: 98% 98%  98%  Weight:      Height:        Intake/Output Summary (Last 24 hours) at 04/12/17 1124 Last data filed at 04/12/17 1224  Gross per 24 hour  Intake            580.8 ml  Output             1250 ml  Net           -669.2 ml   Filed Weights   04/07/17 0335 04/08/17 0500 04/09/17 0527  Weight: 238 lb 5.1  oz (108.1 kg) 241 lb 6.5 oz (109.5 kg) 254 lb 9.6 oz (115.5 kg)    Telemetry      ECG     Physical Exam   General: obese WF, NAD  Extremities: 2+ bilateral lower extremity edema.Both lower legs are wrapped. Right second toe is black,necrotic.   Labs    Chemistry Recent Labs Lab 04/06/17 1650  04/10/17 0338 04/11/17 0714 04/12/17 0559  NA 136  < > 134* 134* 136  K 4.4  < > 3.3* 3.9 3.6  CL 99*  < > 99* 99* 100*  CO2 23  < > 26 26 27   GLUCOSE 284*  < > 203* 239* 225*  BUN 89*  < > 51* 52* 51*  CREATININE 1.99*  < > 1.44* 1.30* 1.38*  CALCIUM 9.7  < > 7.9* 8.3* 8.1*  PROT 8.4*  --   --   --   --   ALBUMIN 3.5  --   --   --   --   AST 29  --   --   --   --  ALT 33  --   --   --   --   ALKPHOS 161*  --   --   --   --   BILITOT 0.5  --   --   --   --   GFRNONAA 27*  < > 40* 46* 42*  GFRAA 32*  < > 47* 53* 49*  ANIONGAP 14  < > 9 9 9   < > = values in this interval not displayed.   Hematology  Recent Labs Lab 04/10/17 0338 04/11/17 0714 04/12/17 0559  WBC 11.7* 15.2* 19.2*  RBC 3.28* 3.46* 3.30*  HGB 9.6* 10.1* 9.6*  HCT 30.0* 31.7* 30.5*  MCV 91.5 91.6 92.4  MCH 29.3 29.2 29.1  MCHC 32.0 31.9 31.5  RDW 14.9 14.9 14.9  PLT 297 320 330    Radiology    No results found.  Cardiac Studies    Patient Profile     56 y.o. female with history of PAD, DM, systolic CHF, CAD, carotid artery disease, PAF who is admitted with altered mental status and sepsis. She has been seen by Dr. Gwenlyn Found in regards to her PAD and gangrenous right second toe.   Assessment & Plan    1. PAD: She has a gangrenous second right toe and likely high grade disease in her right leg. Plans for lower extremity angiography and possible intervention tomorrow per Dr. Gwenlyn Found. NPO at midnight. Orders placed for angiography. Will ask wound care to see her to address her bilateral foot and ankle wounds  Signed, Lauree Chandler, MD  04/12/2017, 11:24 AM

## 2017-04-13 NOTE — Progress Notes (Signed)
ANTICOAGULATION CONSULT NOTE - Follow Up Consult  Pharmacy Consult for Heparin (while Coumadin on hold) Indication: atrial fibrillation  Allergies  Allergen Reactions  . Gabapentin Nausea And Vomiting    POSSIBLE SHAKING? TAKING MED CURRENTLY    Patient Measurements: Height: 5\' 3"  (160 cm) Weight: 254 lb 3.2 oz (115.3 kg) IBW/kg (Calculated) : 52.4 Heparin Dosing Weight: 80.7 kg  Vital Signs: Temp: 98.3 F (36.8 C) (05/31 1030) Temp Source: Oral (05/31 1030) BP: 142/57 (05/31 1030) Pulse Rate: 70 (05/31 1030)  Labs:  Recent Labs  04/11/17 0714 04/12/17 0559 04/13/17 0455  HGB 10.1* 9.6* 9.7*  HCT 31.7* 30.5* 30.8*  PLT 320 330 347  LABPROT 15.1 14.5 14.0  INR 1.18 1.12 1.08  HEPARINUNFRC 0.53 0.39 0.51  CREATININE 1.30* 1.38* 1.32*    Estimated Creatinine Clearance: 59.7 mL/min (A) (by C-G formula based on SCr of 1.32 mg/dL (H)).   Medications:  Scheduled:  . amiodarone  100 mg Oral Daily  . amLODipine  10 mg Oral Daily  . [START ON 04/14/2017] aspirin EC  81 mg Oral Daily  . atorvastatin  40 mg Oral q1800  . bisoprolol  2.5 mg Oral Daily  . budesonide (PULMICORT) nebulizer solution  0.5 mg Nebulization BID  . cholecalciferol  1,000 Units Oral Daily  . [START ON 04/14/2017] clopidogrel  75 mg Oral Q breakfast  . DULoxetine  20 mg Oral Daily  . feeding supplement (PRO-STAT SUGAR FREE 64)  30 mL Oral BID  . folic acid  1 mg Oral Daily  . insulin aspart  0-20 Units Subcutaneous TID WC  . insulin aspart  0-5 Units Subcutaneous QHS  . insulin aspart  15 Units Subcutaneous TID WC  . insulin glargine  48 Units Subcutaneous Daily  . levothyroxine  50 mcg Oral Daily  . losartan  25 mg Oral Daily  . multivitamin with minerals  1 tablet Oral Daily  . pantoprazole  40 mg Oral Q1200  . spironolactone  25 mg Oral Daily  . torsemide  40 mg Oral BID  . cyanocobalamin  1,000 mcg Oral Daily    Assessment: 56 yo female on heparin bridge while off Coumadin; pt s/p  peripherally angiography and bypass today.  Heparin stopped pre-procedure.  To restart post- procedure.  Pt was therapeutic on 1400 units/hr.  Goal of Therapy:  Heparin level 0.3-0.7 units/ml Monitor platelets by anticoagulation protocol: Yes   Plan:  Restart heparin 1400 units/hr Follow-up plans for restarting Coumadin Watch for s/s of bleeding F/U in AM labs  [employer], Pharm.D., BCPS Clinical Pharmacist Pager: [phone] Clinical phone for 04/13/2017 from 8:30-4:00 is [phone]. After 4pm, please call Main Rx [phone]) for assistance. 04/13/2017 2:42 PM

## 2017-04-13 NOTE — Interval H&P Note (Signed)
History and Physical Interval Note:  04/13/2017 7:34 AM  [name]  has presented today for surgery, with the diagnosis of cad  The various methods of treatment have been discussed with the patient and family. After consideration of risks, benefits and other options for treatment, the patient has consented to  Procedure(s): Lower Extremity Angiography (N/A) as a surgical intervention .  The patient's history has been reviewed, patient examined, no change in status, stable for surgery.  I have reviewed the patient's chart and labs.  Questions were answered to the patient's satisfaction.     Quay Burow

## 2017-04-13 NOTE — Progress Notes (Signed)
PROGRESS NOTE        PATIENT DETAILS Name: [name] Age: 56 y.o. Sex: female Date of Birth: [birth year] Admit Date: 04/06/2017 Admitting Physician Rigoberto Noel, MD PCP:Sun, Mikeal Hawthorne, MD  Brief Narrative: Patient is a 56 y.o. female of paroxysmal atrial fibrillation on anticoagulation, chronic systolic heart failure, ongoing tobacco abuse, known history of peripheral vascular disease, COPD presented to the hospital with severe sepsis thought to be secondary to soft tissue infection in a setting of severe PAD, she developed respiratory failure on admission and was intubated and admitted to the intensive care unit. Once clinically improved and extubated, she was transferred to the [facility], see below for further details   Subjective: Lying comfortably in bed-no chest pain, shortness of breath, nausea or vomiting  Assessment/Plan: Acute hypoxic respiratory failure: Resolved, she is now on room air. Respiratory failure occurred in setting of sepsis, she was intubated on admission, extubated on 5/25.   Severe sepsis secondary to lower extremity cellulitis with wounds and UTI: Sepsis pathophysiology has resolved, she has improved-blood cultures remain negative-she remains on empiric Rocephin and Flagyl. Continues to have a leukocytosis-which probably is likely secondary to ongoing inflammation. Will be discussed with cardiology regarding optimal timing for potential right second toe amputation. Her urine culture is positive for Escherichia coli-she already is on empiric ceftriaxone.  Peripheral arterial disease with bilateral lower extremity ulceration and right second toe dry gangrene: Cardiology was consulted-she underwent angiography and PCI on 5/31. We will discuss with cardiology-regarding timing for potential right great toe amputation. Have counseled extensively regarding importance of stopping tobacco use.   Paroxysmal atrial fibrillation: Rate  controlled, continue amiodarone and bisoprolol. Coumadin on hold, was on IV heparin-which was discontinued for her angiography earlier today-we'll need to discuss with cardiology whether or not he can resume anticoagulation.  Chronic systolic heart failure: Volumes status remains stable, continue bisoprolol, losartan, torsemide and Aldactone  Insulin-dependent type 2 diabetes: CBGs fluctuating-but better than before-just adjusted Lantus dosing yesterday-continue Lantus 48 units and SSI. Follow CBGs and adjust accordingly.  Hypothyroidism: Continue Synthroid  COPD: Lungs are clear to this appears to be stable-continue current bronchodilator regimen.  Chronic kidney disease stage III: Creatinine close to usual baseline-follow periodically  Anemia: Likely secondary to acute illness-follow  Leukocytosis: Suspect secondary to ongoing inflammation/chronic once-rather than acute worsening of infection. Remains on empiric antimicrobial therapy  History of liver cirrhosis: Probably secondary to alcohol use-reviewed outpatient cardiology notes-continue diuretic regimen.  History of alcohol/tobacco use: Counseled extensively  Morbid obesity: Counseled regarding importance of weight loss  Morning labs/Imaging ordered: yes  DVT Prophylaxis: Full dose anticoagulation with Heparin on hold-resume when ok with cardiology  Code Status: Full code   Family Communication: None at bedside  Disposition Plan: Remain inpatient-home when workup is completed  Antimicrobial agents: Anti-infectives    Start     Dose/Rate Route Frequency Ordered Stop   04/09/17 1600  cefTRIAXone (ROCEPHIN) 1 g in dextrose 5 % 50 mL IVPB     1 g 100 mL/hr over 30 Minutes Intravenous Every 24 hours 04/09/17 1447     04/09/17 1600  metroNIDAZOLE (FLAGYL) IVPB 500 mg     500 mg 100 mL/hr over 60 Minutes Intravenous Every 8 hours 04/09/17 1447     04/07/17 1800  vancomycin (VANCOCIN) 1,250 mg in sodium chloride 0.9 % 250  mL  IVPB  Status:  Discontinued     1,250 mg 166.7 mL/hr over 90 Minutes Intravenous Every 24 hours 04/06/17 2243 04/09/17 1447   04/07/17 0000  piperacillin-tazobactam (ZOSYN) IVPB 3.375 g  Status:  Discontinued     3.375 g 12.5 mL/hr over 240 Minutes Intravenous Every 8 hours 04/06/17 2210 04/09/17 1447   04/06/17 1745  vancomycin (VANCOCIN) IVPB 1000 mg/200 mL premix     1,000 mg 200 mL/hr over 60 Minutes Intravenous  Once 04/06/17 1731 04/06/17 1945   04/06/17 1745  piperacillin-tazobactam (ZOSYN) IVPB 3.375 g     3.375 g 100 mL/hr over 30 Minutes Intravenous  Once 04/06/17 1731 04/06/17 1819      Procedures: ABI 5/29  CONSULTS:  cardiology, pulmonary/intensive care, orthopedic surgery and vascular surgery  Time spent: 25- minutes-Greater than 50% of this time was spent in counseling, explanation of diagnosis, planning of further management, and coordination of care.  MEDICATIONS: Scheduled Meds: . amiodarone  100 mg Oral Daily  . amLODipine  10 mg Oral Daily  . [START ON 04/14/2017] aspirin EC  81 mg Oral Daily  . atorvastatin  40 mg Oral q1800  . bisoprolol  2.5 mg Oral Daily  . budesonide (PULMICORT) nebulizer solution  0.5 mg Nebulization BID  . cholecalciferol  1,000 Units Oral Daily  . [START ON 04/14/2017] clopidogrel  75 mg Oral Q breakfast  . DULoxetine  20 mg Oral Daily  . feeding supplement (PRO-STAT SUGAR FREE 64)  30 mL Oral BID  . folic acid  1 mg Oral Daily  . insulin aspart  0-20 Units Subcutaneous TID WC  . insulin aspart  0-5 Units Subcutaneous QHS  . insulin aspart  15 Units Subcutaneous TID WC  . insulin glargine  48 Units Subcutaneous Daily  . levothyroxine  50 mcg Oral Daily  . losartan  25 mg Oral Daily  . multivitamin with minerals  1 tablet Oral Daily  . pantoprazole  40 mg Oral Q1200  . spironolactone  25 mg Oral Daily  . torsemide  40 mg Oral BID  . cyanocobalamin  1,000 mcg Oral Daily   Continuous Infusions: . sodium chloride 10 mL/hr at  04/08/17 1325  . sodium chloride 75 mL/hr at 04/13/17 1100  . cefTRIAXone (ROCEPHIN)  IV Stopped (04/12/17 1557)  . metronidazole Stopped (04/13/17 0132)   PRN Meds:.acetaminophen, diphenhydrAMINE, hydrALAZINE, HYDROcodone-acetaminophen, morphine injection, ondansetron (ZOFRAN) IV   PHYSICAL EXAM: Vital signs: Vitals:   04/13/17 0949 04/13/17 0954 04/13/17 0959 04/13/17 1030  BP: (!) 164/78 (!) 171/77  (!) 142/57  Pulse: 84 81 (!) 0 70  Resp: 14 (!) 6 (!) 7 16  Temp:    98.3 F (36.8 C)  TempSrc:    Oral  SpO2: 96% 96% (!) 0% 93%  Weight:      Height:       Filed Weights   04/08/17 0500 04/09/17 0527 04/13/17 0611  Weight: 109.5 kg (241 lb 6.5 oz) 115.5 kg (254 lb 9.6 oz) 115.3 kg (254 lb 3.2 oz)   Body mass index is 45.03 kg/m.   General appearance :Awake, alert, not in any distress.  Eyes:, pupils equally reactive to light and accomodation,no scleral icterus. HEENT: Atraumatic and Normocephalic Neck: supple, no JVD. Resp:Good air entry bilaterally CVS: S1 S2 regular, no murmurs.  GI: Bowel sounds present, Non tender and not distended with no gaurding, rigidity or rebound. Extremities: Both legs are wrapped-dry gangrene to right second toe Neurology:  speech clear,Non focal, sensation is grossly  intact. Psychiatric: Normal judgment and insight. Normal mood. Musculoskeletal:No digital cyanosis Skin:No Rash, warm and dry Wounds:N/A  I have personally reviewed following labs and imaging studies  LABORATORY DATA: CBC:  Recent Labs Lab 04/06/17 1650  04/09/17 0525 04/10/17 0338 04/11/17 0714 04/12/17 0559 04/13/17 0455  WBC 34.9*  < > 13.6* 11.7* 15.2* 19.2* 18.1*  NEUTROABS 32.8*  --   --   --   --   --   --   HGB 11.7*  < > 9.7* 9.6* 10.1* 9.6* 9.7*  HCT 35.7*  < > 30.9* 30.0* 31.7* 30.5* 30.8*  MCV 92.2  < > 93.4 91.5 91.6 92.4 93.3  PLT 373  < > 308 297 320 330 347  < > = values in this interval not displayed.  Basic Metabolic Panel:  Recent Labs Lab  04/09/17 0525 04/10/17 0338 04/11/17 0714 04/12/17 0559 04/13/17 0455  NA 136 134* 134* 136 138  K 4.4 3.3* 3.9 3.6 3.7  CL 98* 99* 99* 100* 101  CO2 30 26 26 27 28   GLUCOSE 261* 203* 239* 225* 189*  BUN 45* 51* 52* 51* 45*  CREATININE 1.54* 1.44* 1.30* 1.38* 1.32*  CALCIUM 8.1* 7.9* 8.3* 8.1* 8.5*    GFR: Estimated Creatinine Clearance: 59.7 mL/min (A) (by C-G formula based on SCr of 1.32 mg/dL (H)).  Liver Function Tests:  Recent Labs Lab 04/06/17 1650  AST 29  ALT 33  ALKPHOS 161*  BILITOT 0.5  PROT 8.4*  ALBUMIN 3.5   No results for input(s): LIPASE, AMYLASE in the last 168 hours. No results for input(s): AMMONIA in the last 168 hours.  Coagulation Profile:  Recent Labs Lab 04/09/17 0525 04/10/17 0338 04/11/17 0714 04/12/17 0559 04/13/17 0455  INR 1.75 1.52 1.18 1.12 1.08    Cardiac Enzymes:  Recent Labs Lab 04/06/17 2325  TROPONINI 0.06*    BNP (last 3 results) No results for input(s): PROBNP in the last 8760 hours.  HbA1C: No results for input(s): HGBA1C in the last 72 hours.  CBG:  Recent Labs Lab 04/12/17 0801 04/12/17 1146 04/12/17 1640 04/12/17 2107 04/13/17 1014  GLUCAP 248* 305* 166* 190* 239*    Lipid Profile: No results for input(s): CHOL, HDL, LDLCALC, TRIG, CHOLHDL, LDLDIRECT in the last 72 hours.  Thyroid Function Tests: No results for input(s): TSH, T4TOTAL, FREET4, T3FREE, THYROIDAB in the last 72 hours.  Anemia Panel: No results for input(s): VITAMINB12, FOLATE, FERRITIN, TIBC, IRON, RETICCTPCT in the last 72 hours.  Urine analysis:    Component Value Date/Time   COLORURINE YELLOW 04/06/2017 1705   APPEARANCEUR HAZY (A) 04/06/2017 1705   LABSPEC 1.006 04/06/2017 1705   PHURINE 5.0 04/06/2017 1705   GLUCOSEU >=500 (A) 04/06/2017 1705   HGBUR SMALL (A) 04/06/2017 1705   BILIRUBINUR NEGATIVE 04/06/2017 1705   KETONESUR NEGATIVE 04/06/2017 1705   PROTEINUR NEGATIVE 04/06/2017 1705   UROBILINOGEN 0.2  09/05/2015 0012   NITRITE POSITIVE (A) 04/06/2017 1705   LEUKOCYTESUR NEGATIVE 04/06/2017 1705    Sepsis Labs: Lactic Acid, Venous    Component Value Date/Time   LATICACIDVEN 0.89 04/06/2017 2018    MICROBIOLOGY: Recent Results (from the past 240 hour(s))  Urine culture     Status: Abnormal   Collection Time: 04/06/17  5:05 PM  Result Value Ref Range Status   Specimen Description URINE, RANDOM  Final   Special Requests unknown Normal  Final   Culture >=100,000 COLONIES/mL ESCHERICHIA COLI (A)  Final   Report Status 04/08/2017 FINAL  Final  Organism ID, Bacteria ESCHERICHIA COLI (A)  Final      Susceptibility   Escherichia coli - MIC*    AMPICILLIN >=32 RESISTANT Resistant     CEFAZOLIN <=4 SENSITIVE Sensitive     CEFTRIAXONE <=1 SENSITIVE Sensitive     CIPROFLOXACIN >=4 RESISTANT Resistant     GENTAMICIN <=1 SENSITIVE Sensitive     IMIPENEM <=0.25 SENSITIVE Sensitive     NITROFURANTOIN <=16 SENSITIVE Sensitive     TRIMETH/SULFA <=20 SENSITIVE Sensitive     AMPICILLIN/SULBACTAM 16 INTERMEDIATE Intermediate     PIP/TAZO <=4 SENSITIVE Sensitive     Extended ESBL NEGATIVE Sensitive     * >=100,000 COLONIES/mL ESCHERICHIA COLI  Culture, blood (Routine X 2) w Reflex to ID Panel     Status: None   Collection Time: 04/06/17 11:33 PM  Result Value Ref Range Status   Specimen Description BLOOD RIGHT HAND  Final   Special Requests   Final    BOTTLES DRAWN AEROBIC AND ANAEROBIC Blood Culture adequate volume   Culture NO GROWTH 5 DAYS  Final   Report Status 04/12/2017 FINAL  Final  MRSA PCR Screening     Status: None   Collection Time: 04/06/17 11:40 PM  Result Value Ref Range Status   MRSA by PCR NEGATIVE NEGATIVE Final    Comment:        The GeneXpert MRSA Assay (FDA approved for NASAL specimens only), is one component of a comprehensive MRSA colonization surveillance program. It is not intended to diagnose MRSA infection nor to guide or monitor treatment for MRSA  infections.   Culture, blood (Routine X 2) w Reflex to ID Panel     Status: None   Collection Time: 04/06/17 11:44 PM  Result Value Ref Range Status   Specimen Description BLOOD LEFT HAND  Final   Special Requests IN PEDIATRIC BOTTLE Blood Culture adequate volume  Final   Culture NO GROWTH 5 DAYS  Final   Report Status 04/12/2017 FINAL  Final  Surgical PCR screen     Status: None   Collection Time: 04/13/17  6:40 AM  Result Value Ref Range Status   MRSA, PCR NEGATIVE NEGATIVE Final   Staphylococcus aureus NEGATIVE NEGATIVE Final    Comment:        The Xpert SA Assay (FDA approved for NASAL specimens in patients over 48 years of age), is one component of a comprehensive surveillance program.  Test performance has been validated by [facility] for patients greater than or equal to 66 year old. It is not intended to diagnose infection nor to guide or monitor treatment.     RADIOLOGY STUDIES/RESULTS: Dg Tibia/fibula Left  Result Date: 04/07/2017 CLINICAL DATA:  Cellulitis and open wounds on both legs. EXAM: LEFT TIBIA AND FIBULA - 2 VIEW COMPARISON:  Left foot 03/15/2017.  Left knee 01/07/2016 FINDINGS: No evidence of acute fracture or dislocation of the left tibia or fibula. No focal bone lesion or bone destruction. Bone cortex appears intact. Soft tissue calcifications consistent with vascular calcifications and phleboliths. Infiltration in the subcutaneous fat likely to represent edema or cellulitis. No soft tissue gas collections. No radiopaque foreign bodies. IMPRESSION: No acute bony abnormalities. Subcutaneous soft tissue infiltration likely represent cellulitis or edema. Electronically Signed   By: Lucienne Capers M.D.   On: 04/07/2017 02:33   Dg Tibia/fibula Right  Result Date: 04/07/2017 CLINICAL DATA:  Cellulitis and open wounds on both legs. EXAM: RIGHT TIBIA AND FIBULA - 2 VIEW COMPARISON:  Right foot 03/21/2017.  Right knee 08/11/2016. FINDINGS: No evidence of acute  fracture or dislocation of the right tibia or fibula. No focal bone lesion or bone destruction. Bone cortex appears intact without erosion. Soft tissue calcifications consistent with phleboliths and vascular calcifications. Infiltration of the subcutaneous fat likely to represent edema or cellulitis. No radiopaque soft tissue foreign bodies or gas collections. Soft tissue defect along the distal lateral lower leg consistent with history of ulceration. IMPRESSION: No acute bony abnormalities. Infiltration in the subcutaneous fatty tissues consistent with cellulitis or edema. Electronically Signed   By: Lucienne Capers M.D.   On: 04/07/2017 02:34   Dg Chest Port 1 View  Result Date: 04/07/2017 CLINICAL DATA:  Intubation. EXAM: PORTABLE CHEST 1 VIEW COMPARISON:  04/06/2017 . FINDINGS: Surgical clips left neck. Endotracheal tube, NG tube in stable position. Cardiomegaly with normal pulmonary vascularity. Low lung volumes with mild basilar atelectasis. Tiny left pleural effusion cannot be excluded. No pneumothorax. IMPRESSION: 1. Lines and tubes in stable position. 2. Low lung volumes with mild basilar atelectasis. Tiny left pleural effusion cannot be excluded. Electronically Signed   By: Marcello Moores  Register   On: 04/07/2017 06:34   Dg Chest Portable 1 View  Result Date: 04/06/2017 CLINICAL DATA:  Endotracheal tube and orogastric tube placement. Initial encounter. EXAM: PORTABLE CHEST 1 VIEW COMPARISON:  Chest radiograph performed 04/06/2017 FINDINGS: The patient's endotracheal tube appears to end at the carina. This should be retracted 3 cm. The patient's enteric tube is seen ending overlying the body of the stomach. There is mild elevation of the left hemidiaphragm. Mild vascular congestion is noted. Mild bibasilar atelectasis seen. No pleural effusion or pneumothorax is seen. The cardiomediastinal silhouette is mildly enlarged. No acute osseous abnormalities are identified. IMPRESSION: 1. Endotracheal tube  appears to end at the carina. This should be retracted 3 cm. 2. Enteric tube noted ending overlying the body of the stomach. 3. Mild elevation of the left hemidiaphragm. Mild bibasilar atelectasis seen. 4. Mild vascular congestion and mild cardiomegaly noted. These results were called by telephone at the time of interpretation on 04/06/2017 at 7:44 pm to Dr. Carmin Muskrat, who verbally acknowledged these results. Electronically Signed   By: Garald Balding M.D.   On: 04/06/2017 19:45   Dg Chest Port 1 View  Result Date: 04/06/2017 CLINICAL DATA:  56 y/o  F; possible medication overdose. EXAM: PORTABLE CHEST 1 VIEW COMPARISON:  56 y/o  F; 02/22/2016 chest radiograph. FINDINGS: Normal cardiac silhouette. Aortic atherosclerosis with calcification. Clear lungs. No pleural effusion or pneumothorax. Bones are unremarkable. IMPRESSION: No active disease. Electronically Signed   By: Kristine Garbe M.D.   On: 04/06/2017 16:38   Dg Foot Complete Left  Result Date: 03/15/2017 CLINICAL DATA:  Cellulitis for 1 month EXAM: LEFT FOOT - COMPLETE 3+ VIEW COMPARISON:  None. FINDINGS: There is no evidence of fracture or dislocation. There is no evidence of arthropathy or other focal bone abnormality. Soft tissues are unremarkable. IMPRESSION: No acute abnormality noted. Electronically Signed   By: Inez Catalina M.D.   On: 03/15/2017 19:39   Dg Foot Complete Right  Result Date: 03/21/2017 CLINICAL DATA:  Nonhealing ulcer second digit EXAM: RIGHT FOOT COMPLETE - 3+ VIEW COMPARISON:  None. FINDINGS: Frontal, oblique, and lateral views obtained. No fracture or dislocation. No erosive change or bony destruction. There is mild subluxation at the second PIP joint. No appreciable joint space narrowing or erosion. There is an inferior calcaneal spur with plantar calcification at the hindfoot level. There are scattered foci  of arterial vascular calcification. There is mild soft tissue swelling dorsally in the mid and forefoot  regions. IMPRESSION: Areas of soft tissue swelling. Mild subluxation second PIP joint. No frank dislocation or fracture. No erosive change or bony destruction. No soft tissue ulceration or radiopaque foreign body evident by radiography. There is plantar fascia calcification with a nearby inferior calcaneal spur. There are foci of arterial vascular calcification. Electronically Signed   By: Lowella Grip III M.D.   On: 03/21/2017 16:30   Dg Toe 2nd Right  Result Date: 04/06/2017 CLINICAL DATA:  Right second toe open wound. Clinical concern for osteomyelitis. EXAM: RIGHT SECOND TOE COMPARISON:  Right foot dated 03/21/2017. FINDINGS: PA and 2 oblique views of the right second toe were obtained. There is no lateral view. The patient was not able to cooperate for a true lateral view. There is soft tissue irregularity along the medial aspect of the third toe. No fracture, dislocation, bone erosion, periosteal reaction or soft tissue gas seen. IMPRESSION: No radiographic evidence of osteomyelitis. Electronically Signed   By: Claudie Revering M.D.   On: 04/06/2017 18:15     LOS: 7 days   Oren Binet, MD  [facility] Pager:[phone]  If 7PM-7AM, please contact night-coverage [email or web address] Password [email or web address] 04/13/2017, 1:17 PM

## 2017-04-13 NOTE — Care Management Note (Addendum)
Case Management Note  Patient Details  Name: [name] MRN: [id number] Date of Birth: [birth year]  Subjective/Objective:  Presents with sepsis, transferred to post cath unit, s/p pv stent, will be on plavix, also pt eval rec HHPT. She has Medicare [employer].  Patient is active with [facility] for [facility], will need to add  HHPT, referral made to [facility] for HHPT, HHRN  will need HHPT orders.   6/1 9892 Tomi Bamberger RN, BSN - awaiting benefit check for eliquis.  Patient has Medicaid, co pay is 3.50.  PCP  Hettie Holstein                  Action/Plan: NCM will follow for dc needs.   Expected Discharge Date:                  Expected Discharge Plan:     In-House Referral:     Discharge planning Services  CM Consult  Post Acute Care Choice:    Choice offered to:     DME Arranged:    DME Agency:     HH Arranged:    HH Agency:     Status of Service:  In process, will continue to follow  If discussed at Long Length of Stay Meetings, dates discussed:    Additional Comments:  Zenon Mayo, RN 04/13/2017, 3:14 PM

## 2017-04-13 NOTE — Progress Notes (Addendum)
Inpatient Diabetes Program Recommendations  AACE/ADA: New Consensus Statement on Inpatient Glycemic Control (2015)  Target Ranges:  Prepandial:   less than 140 mg/dL      Peak postprandial:   less than 180 mg/dL (1-2 hours)      Critically ill patients:  140 - 180 mg/dL   Lab Results  Component Value Date   GLUCAP 190 (H) 04/12/2017   HGBA1C 11.1 (H) 10/15/2015   Review of Glycemic Control  Diabetes history:DM 2 Outpatient Diabetes medications: Lantus 80 units, Novolog 44 untis tid Current orders for Inpatient glycemic control: Lantus 48 units, Novolog Resistant + Novolog HS scale, Novolog 15 units tid meal coverage  Inpatient Diabetes Program Recommendations:    Fasting glucose this am 189 mg/dl with Lantus 38 units. Lantus 48 units ordered for today. Please consider decreasing Lantus dose slightly to 43 units.  Glucose increased with meals patient is on a large dose of meal coverage at home. Consider increasing meal coverage when eating from 15 units to 18 units tid if patient eats at least 50% of meals. Please make sure parameters are on order.  Thanks,  Tama Headings RN, MSN, [facility] Inpatient Diabetes Coordinator Team Pager [phone] (8a-5p)

## 2017-04-13 NOTE — Progress Notes (Signed)
PT Cancellation Note  Patient Details Name: KHADEJA ABT MRN: [id number] DOB: 14-May-1961   Cancelled Treatment:    Reason Eval/Treat Not Completed: Patient at procedure or test/unavailable.  Pt went to a back from Cath and now on bedrest.  Will try back as able or see Friday as able. 04/13/2017  Donnella Sham, [place] [phone]  (pager)   Tessie Fass Hellen Shanley 04/13/2017, 2:19 PM

## 2017-04-14 ENCOUNTER — Other Ambulatory Visit: Payer: Self-pay | Admitting: Cardiovascular Disease

## 2017-04-14 ENCOUNTER — Other Ambulatory Visit: Payer: Self-pay | Admitting: Cardiology

## 2017-04-14 DIAGNOSIS — I480 Paroxysmal atrial fibrillation: Secondary | ICD-10-CM

## 2017-04-14 DIAGNOSIS — I739 Peripheral vascular disease, unspecified: Secondary | ICD-10-CM

## 2017-04-14 LAB — CBC
HCT: 29 % — ABNORMAL LOW (ref 36.0–46.0)
HEMOGLOBIN: 8.9 g/dL — AB (ref 12.0–15.0)
MCH: 29.1 pg (ref 26.0–34.0)
MCHC: 30.7 g/dL (ref 30.0–36.0)
MCV: 94.8 fL (ref 78.0–100.0)
Platelets: 325 10*3/uL (ref 150–400)
RBC: 3.06 MIL/uL — ABNORMAL LOW (ref 3.87–5.11)
RDW: 15.5 % (ref 11.5–15.5)
WBC: 15 10*3/uL — ABNORMAL HIGH (ref 4.0–10.5)

## 2017-04-14 LAB — GLUCOSE, CAPILLARY: Glucose-Capillary: 230 mg/dL — ABNORMAL HIGH (ref 65–99)

## 2017-04-14 LAB — HEPARIN LEVEL (UNFRACTIONATED): Heparin Unfractionated: 0.36 IU/mL (ref 0.30–0.70)

## 2017-04-14 LAB — BASIC METABOLIC PANEL
ANION GAP: 8 (ref 5–15)
BUN: 34 mg/dL — AB (ref 6–20)
CALCIUM: 8.4 mg/dL — AB (ref 8.9–10.3)
CO2: 27 mmol/L (ref 22–32)
CREATININE: 1.23 mg/dL — AB (ref 0.44–1.00)
Chloride: 102 mmol/L (ref 101–111)
GFR calc Af Amer: 57 mL/min — ABNORMAL LOW (ref >60)
GFR calc non Af Amer: 49 mL/min — ABNORMAL LOW (ref >60)
GLUCOSE: 230 mg/dL — AB (ref 65–99)
Potassium: 3.8 mmol/L (ref 3.5–5.1)
Sodium: 137 mmol/L (ref 135–145)

## 2017-04-14 LAB — PROTIME-INR
INR: 1.12
PROTHROMBIN TIME: 14.4 s (ref 11.4–15.2)

## 2017-04-14 MED ORDER — APIXABAN 5 MG PO TABS
5.0000 mg | ORAL_TABLET | Freq: Two times a day (BID) | ORAL | Status: DC
  Administered 2017-04-14: 09:00:00 5 mg via ORAL
  Filled 2017-04-14: qty 1

## 2017-04-14 MED ORDER — INSULIN GLARGINE 100 UNIT/ML SOLOSTAR PEN: 48.0000 [IU] | PEN_INJECTOR | Freq: Every day | SUBCUTANEOUS | 11 refills | Status: DC

## 2017-04-14 MED ORDER — PRO-STAT SUGAR FREE PO LIQD: 30.0000 mL | Freq: Two times a day (BID) | ORAL | 0 refills | Status: DC

## 2017-04-14 MED ORDER — CLOPIDOGREL BISULFATE 75 MG PO TABS: 75.0000 mg | ORAL_TABLET | Freq: Every day | ORAL | 0 refills | Status: DC

## 2017-04-14 MED ORDER — INSULIN ASPART 100 UNIT/ML FLEXPEN: 25.0000 [IU] | PEN_INJECTOR | Freq: Three times a day (TID) | SUBCUTANEOUS | 11 refills | Status: DC

## 2017-04-14 MED ORDER — AMOXICILLIN-POT CLAVULANATE 875-125 MG PO TABS: 1.0000 | ORAL_TABLET | Freq: Two times a day (BID) | ORAL | 0 refills | Status: DC

## 2017-04-14 MED ORDER — APIXABAN 5 MG PO TABS: 5.0000 mg | ORAL_TABLET | Freq: Two times a day (BID) | ORAL | 0 refills | Status: DC

## 2017-04-14 MED FILL — Lidocaine HCl Local Inj 1%: INTRAMUSCULAR | Qty: 40 | Status: AC

## 2017-04-14 NOTE — Progress Notes (Signed)
Inpatient Diabetes Program Recommendations  AACE/ADA: New Consensus Statement on Inpatient Glycemic Control (2015)  Target Ranges:  Prepandial:   less than 140 mg/dL      Peak postprandial:   less than 180 mg/dL (1-2 hours)      Critically ill patients:  140 - 180 mg/dL   Lab Results  Component Value Date   GLUCAP 230 (H) 04/14/2017   HGBA1C 11.1 (H) 10/15/2015    Review of Glycemic Control  Diabetes history:DM 2 Outpatient Diabetes medications: Lantus 80 units, Novolog 44 untis tid Current orders for Inpatient glycemic control: Lantus 48units, Novolog Resistant + Novolog HS scale, Novolog 15units tid meal coverage  Inpatient Diabetes Program Recommendations:  Noted patient did not receive Lantus yesterday due to patient being in procedure. Please consider: -A1c to determine prehospital glycemic control -Increase Novolog meal coverage to 18 units -Decrease Lantus to 43 units  Thank you, Bethena Roys E. Carime Dinkel, RN, MSN, CDE  Diabetes Coordinator Inpatient Glycemic Control Team Team Pager [phone] (8am-5pm) 04/14/2017 8:42 AM

## 2017-04-14 NOTE — Progress Notes (Signed)
PT Cancellation Note  Patient Details Name: [name] MRN: [id number] DOB: Mar 31, 1961   Cancelled Treatment:    Reason Eval/Treat Not Completed: Patient at procedure or test/unavailable.  Try later as time and pt allow.   Ramond Dial 04/14/2017, 9:18 AM   Mee Hives, PT MS Acute Rehab Dept. Number: [place]

## 2017-04-14 NOTE — Discharge Summary (Signed)
PATIENT DETAILS Name: [name] Age: 56 y.o. Sex: female Date of Birth: 11-04-1961 MRN: [id number]. Admitting Physician: Rigoberto Noel, MD PCP:Sun, Mikeal Hawthorne, MD  Admit Date: 04/06/2017 Discharge date: 04/14/2017  Recommendations for Outpatient Follow-up:  1. Follow up with PCP in 1-2 weeks 2. Dr. Sharol Given to follow up early next week to arrange for outpatient toe amputation 3. Note-patient has been switched from Coumadin to [place] will need to be stopped appropriately before amputation. 4. Please ensure follow-up with both orthopedics (Dr. Sharol Given) and with cardiology (Dr. Gwenlyn Found) 5. Please obtain BMP/CBC in one week 6. Please continue to optimize insulin regimen-dose adjusted while in the hospital-see below  Admitted From:  Home  Disposition: Home with home health services   Home Health: Yes  Equipment/Devices: None  Discharge Condition: Stable  CODE STATUS: FULL CODE  Diet recommendation:  Heart Healthy / Carb Modified   Brief Summary: See H&P, Labs, Consult and Test reports for all details in brief, Patient is a 56 y.o. female of paroxysmal atrial fibrillation on anticoagulation, chronic systolic heart failure, ongoing tobacco abuse, known history of peripheral vascular disease, COPD presented to the hospital with severe sepsis thought to be secondary to soft tissue infection in a setting of severe PAD, she developed respiratory failure on admission and was intubated and admitted to the intensive care unit. Once clinically improved and extubated, she was transferred to the [facility] hospitalist service, see below for further details   Brief Hospital Course: Acute hypoxic respiratory failure: Resolved, she is now on room air. Respiratory failure occurred in setting of sepsis, she was intubated on admission, extubated on 5/25.   Severe sepsis secondary to lower extremity cellulitis with wounds and UTI: Sepsis pathophysiology has resolved, she has improved-blood cultures  remain negative-she was treated with empiric Rocephin and Flagyl-she is clinically improved-blood cultures continue to be negative, urine culture was positive for pansensitive Escherichia coli. Leukocytosis is down trending-I suspect this is probably secondary to ongoing inflammation due to critical limb ischemia. She would be transitioned to Augmentin on discharge with plans to follow-up with Dr. Sharol Given early next week for toe amputation-see below.  Critical limb ischemia due to Peripheral arterial disease with bilateral lower extremity ulceration and right second toe dry gangrene: Cardiology was consulted-she underwent angiography with angioplasty on 5/31. Case was discussed with cardiology-plans are to continue Plavix and Eliquis on discharge, she no longer will be on aspirin. Case was discussed with Dr. Sharol Given over the phone on 5/31-he recommends to discharge patient on antibiotics, he will follow the patient in the outpatient setting with plans to perform a amputation early next week. Dr. Sharol Given aware that the patient is on Eliquis, have asked the patient to call Dr. Jess Barters office on Monday to make an appointment if she does not hear from them. She will also require outpatient follow-up with cardiology-Dr. Gwenlyn Found. She was extensively counseled regarding importance of completely stopping tobacco use.  Paroxysmal atrial fibrillation: Rate controlled, continue amiodarone and bisoprolol.  initially on Coumadin-which was placed on hold while the patient was in the hospital in anticipation of angiography and possible amputation-she was maintained on IV heparin. On discharge we will switch her to [facility] will need to stop leukocytosis as instructed by Dr. Sharol Given early next week before she undergoes amputation.   Chronic systolic heart failure: Volumes status remains stable, continue bisoprolol, losartan, torsemide and Aldactone. Continue to follow at cardiology clinic or CHF clinic.  Insulin-dependent type 2  diabetes: CBGs  relatively stable-continue Lantus 48  units and NovoLog-she is to follow with her primary care M.D. for further optimization.  Hypothyroidism: Continue Synthroid  COPD: Lungs are clear to this appears to be stable-continue current bronchodilator regimen.  Chronic kidney disease stage III: Creatinine close to usual baseline-follow periodically  Anemia: Likely secondary to acute illness-follow  Leukocytosis: down trending- Suspect secondary to ongoing inflammation/chronic once-rather than acute worsening of infection.She will be discharged on Augmentin for a few more days.  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  History of alcohol/tobacco use: Counseled extensively  Morbid obesity: Counseled regarding importance of weight loss  Procedures/Studies: PV procedure: 04/13/17  Procedures Performed: 1. Abdominal aortogram 2. Bilateral iliac angiogram 3. Right lower extremity runoff/contralateral access (second order catheter placement) 4. Placement of 6 mm spider distal protection device below-the-knee right popliteal artery 5. Hawk 1 directional atherectomy, drug-eluting balloon angioplasty long right SFA CTO  Angiographic Data:   1: Abdominal aortogram-the distal abdominal aorta was free of significant atherosclerotic changes 2: Iliac arteries-widely patent 3: Right lower extremity-occluded right SFA from just beyond the origin with reconstitution in the adductor canal. There did not appear to be any name of both vessels below the knee. The peroneal filled by collaterals.  Discharge Diagnoses:  Active Problems:   Severe sepsis ([name])   Gangrene of toe of right foot ([name])   PAD (peripheral artery disease) ([place])   Discharge Instructions:  Activity:  As tolerated with Full fall precautions use walker/cane & assistance  as needed   Discharge Instructions    Diet - low sodium heart healthy    Complete by:  As directed    Increase activity slowly    Complete by:  As directed      Allergies as of 04/14/2017      Reactions   Gabapentin Nausea And Vomiting   POSSIBLE SHAKING? TAKING MED CURRENTLY      Medication List    STOP taking these medications   warfarin 5 MG tablet Commonly known as:  COUMADIN     TAKE these medications   acetaminophen-codeine 300-30 MG tablet Commonly known as:  TYLENOL #3 Take 1 tablet by mouth 2 (two) times daily as needed for pain.   albuterol (2.5 MG/3ML) 0.083% nebulizer solution Commonly known as:  PROVENTIL Take 3 mLs (2.5 mg total) by nebulization every 6 (six) hours as needed. For wheezing.   albuterol 108 (90 Base) MCG/ACT inhaler Commonly known as:  PROVENTIL HFA;VENTOLIN HFA Inhale 2 puffs into the lungs every 6 (six) hours as needed for wheezing or shortness of breath.   amiodarone 100 MG tablet Commonly known as:  PACERONE TAKE 1 TABLET BY MOUTH ONCE DAILY   amLODipine 10 MG tablet Commonly known as:  NORVASC Take 10 mg by mouth daily.   amoxicillin-clavulanate 875-125 MG tablet Commonly known as:  AUGMENTIN Take 1 tablet by mouth 2 (two) times daily.   apixaban 5 MG Tabs tablet Commonly known as:  ELIQUIS Take 1 tablet (5 mg total) by mouth 2 (two) times daily.   atorvastatin 40 MG tablet Commonly known as:  LIPITOR TAKE 1 TABLET BY MOUTH ONCE EVERY EVENING   bisoprolol 5 MG tablet Commonly known as:  ZEBETA TAKE 1/2 TABLET BY MOUTH EVERY DAY   cholecalciferol 1000 units tablet Commonly known as:  VITAMIN D Take 1,000 Units by mouth daily.   clopidogrel 75 MG tablet Commonly known as:  PLAVIX Take 1 tablet (75 mg total) by mouth daily with breakfast. Start  taking on:  04/15/2017   cyanocobalamin 1000 MCG tablet Take 1 tablet (1,000 mcg total) by mouth daily.   feeding supplement (PRO-STAT SUGAR FREE 64) Liqd Take 30 mLs by mouth  2 (two) times daily.   folic acid 1 MG tablet Commonly known as:  FOLVITE TAKE 1 TABLET BY MOUTH EVERY DAY   gabapentin 300 MG capsule Commonly known as:  NEURONTIN Take 300 mg by mouth 4 (four) times daily.   insulin aspart 100 UNIT/ML FlexPen Commonly known as:  NOVOLOG FLEXPEN Inject 25 Units into the skin 3 (three) times daily with meals. What changed:  how much to take   Insulin Glargine 100 UNIT/ML Solostar Pen Commonly known as:  LANTUS SOLOSTAR Inject 48 Units into the skin daily at 10 pm. What changed:  how much to take   levothyroxine 50 MCG tablet Commonly known as:  SYNTHROID, LEVOTHROID Take 1 tablet (50 mcg total) by mouth daily.   losartan 25 MG tablet Commonly known as:  COZAAR Take 1 tablet (25 mg total) by mouth daily.   metolazone 2.5 MG tablet Commonly known as:  ZAROXOLYN TAKE 1 TABLET BY MOUTH ONCE A WEEK ON THURSDAY. MAY TAKE AN ADDITIONAL TABLET AS DIRECTED BY HF CLINIC.   multivitamin tablet Take 1 tablet by mouth daily.   oxyCODONE-acetaminophen 5-325 MG tablet Commonly known as:  PERCOCET/ROXICET Take 2 tablets by mouth every 4 (four) hours as needed for severe pain.   potassium chloride SA 20 MEQ tablet Commonly known as:  K-DUR,KLOR-CON Take 1 tablet (20 mEq total) by mouth daily. Take an additional 20 meq (1 tab) on Thursday with metolazone   spironolactone 25 MG tablet Commonly known as:  ALDACTONE Take 1 tablet (25 mg total) by mouth daily.   torsemide 20 MG tablet Commonly known as:  DEMADEX Take 2 tablets (40 mg total) by mouth 2 (two) times daily.      Follow-up Information    [name], MD Follow up.   Specialties:  Cardiology, Radiology Why:  The office will call you with follow up appt and doppler studies.  Contact information: [address] [place] [address] [phone]        Sandi Mariscal, MD. Schedule an appointment as soon as possible for a visit in 1 week(s).   Specialty:  Internal  Medicine Contact information: [place] [address] [phone]        Newt Minion, MD Follow up.   Specialty:  Orthopedic Surgery Why:  call office on Monday-6/4 Contact information: [address] [place] [address] [phone]          Allergies  Allergen Reactions  . Gabapentin Nausea And Vomiting    POSSIBLE SHAKING? TAKING MED CURRENTLY    Consultations:   cardiology, orthopedic surgery and vascular surgery  Other Procedures/Studies: Dg Tibia/fibula Left  Result Date: 04/07/2017 CLINICAL DATA:  Cellulitis and open wounds on both legs. EXAM: LEFT TIBIA AND FIBULA - 2 VIEW COMPARISON:  Left foot 03/15/2017.  Left knee 01/07/2016 FINDINGS: No evidence of acute fracture or dislocation of the left tibia or fibula. No focal bone lesion or bone destruction. Bone cortex appears intact. Soft tissue calcifications consistent with vascular calcifications and phleboliths. Infiltration in the subcutaneous fat likely to represent edema or cellulitis. No soft tissue gas collections. No radiopaque foreign bodies. IMPRESSION: No acute bony abnormalities. Subcutaneous soft tissue infiltration likely represent cellulitis or edema. Electronically Signed   By: Oren Beckmann.D.  On: 04/07/2017 02:33   Dg Tibia/fibula Right  Result Date: 04/07/2017 CLINICAL DATA:  Cellulitis and open wounds on both legs. EXAM: RIGHT TIBIA AND FIBULA - 2 VIEW COMPARISON:  Right foot 03/21/2017.  Right knee 08/11/2016. FINDINGS: No evidence of acute fracture or dislocation of the right tibia or fibula. No focal bone lesion or bone destruction. Bone cortex appears intact without erosion. Soft tissue calcifications consistent with phleboliths and vascular calcifications. Infiltration of the subcutaneous fat likely to represent edema or cellulitis. No radiopaque soft tissue foreign bodies or gas collections. Soft tissue defect along the distal lateral lower leg consistent  with history of ulceration. IMPRESSION: No acute bony abnormalities. Infiltration in the subcutaneous fatty tissues consistent with cellulitis or edema. Electronically Signed   By: Lucienne Capers M.D.   On: 04/07/2017 02:34   Dg Chest Port 1 View  Result Date: 04/07/2017 CLINICAL DATA:  Intubation. EXAM: PORTABLE CHEST 1 VIEW COMPARISON:  04/06/2017 . FINDINGS: Surgical clips left neck. Endotracheal tube, NG tube in stable position. Cardiomegaly with normal pulmonary vascularity. Low lung volumes with mild basilar atelectasis. Tiny left pleural effusion cannot be excluded. No pneumothorax. IMPRESSION: 1. Lines and tubes in stable position. 2. Low lung volumes with mild basilar atelectasis. Tiny left pleural effusion cannot be excluded. Electronically Signed   By: Marcello Moores  Register   On: 04/07/2017 06:34   Dg Chest Portable 1 View  Result Date: 04/06/2017 CLINICAL DATA:  Endotracheal tube and orogastric tube placement. Initial encounter. EXAM: PORTABLE CHEST 1 VIEW COMPARISON:  Chest radiograph performed 04/06/2017 FINDINGS: The patient's endotracheal tube appears to end at the carina. This should be retracted 3 cm. The patient's enteric tube is seen ending overlying the body of the stomach. There is mild elevation of the left hemidiaphragm. Mild vascular congestion is noted. Mild bibasilar atelectasis seen. No pleural effusion or pneumothorax is seen. The cardiomediastinal silhouette is mildly enlarged. No acute osseous abnormalities are identified. IMPRESSION: 1. Endotracheal tube appears to end at the carina. This should be retracted 3 cm. 2. Enteric tube noted ending overlying the body of the stomach. 3. Mild elevation of the left hemidiaphragm. Mild bibasilar atelectasis seen. 4. Mild vascular congestion and mild cardiomegaly noted. These results were called by telephone at the time of interpretation on 04/06/2017 at 7:44 pm to Dr. Carmin Muskrat, who verbally acknowledged these results. Electronically  Signed   By: Garald Balding M.D.   On: 04/06/2017 19:45   Dg Chest Port 1 View  Result Date: 04/06/2017 CLINICAL DATA:  56 y/o  F; possible medication overdose. EXAM: PORTABLE CHEST 1 VIEW COMPARISON:  56 y/o  F; 02/22/2016 chest radiograph. FINDINGS: Normal cardiac silhouette. Aortic atherosclerosis with calcification. Clear lungs. No pleural effusion or pneumothorax. Bones are unremarkable. IMPRESSION: No active disease. Electronically Signed   By: Kristine Garbe M.D.   On: 04/06/2017 16:38   Dg Foot Complete Left  Result Date: 03/15/2017 CLINICAL DATA:  Cellulitis for 1 month EXAM: LEFT FOOT - COMPLETE 3+ VIEW COMPARISON:  None. FINDINGS: There is no evidence of fracture or dislocation. There is no evidence of arthropathy or other focal bone abnormality. Soft tissues are unremarkable. IMPRESSION: No acute abnormality noted. Electronically Signed   By: Inez Catalina M.D.   On: 03/15/2017 19:39   Dg Foot Complete Right  Result Date: 03/21/2017 CLINICAL DATA:  Nonhealing ulcer second digit EXAM: RIGHT FOOT COMPLETE - 3+ VIEW COMPARISON:  None. FINDINGS: Frontal, oblique, and lateral views obtained. No fracture or dislocation. No erosive change  or bony destruction. There is mild subluxation at the second PIP joint. No appreciable joint space narrowing or erosion. There is an inferior calcaneal spur with plantar calcification at the hindfoot level. There are scattered foci of arterial vascular calcification. There is mild soft tissue swelling dorsally in the mid and forefoot regions. IMPRESSION: Areas of soft tissue swelling. Mild subluxation second PIP joint. No frank dislocation or fracture. No erosive change or bony destruction. No soft tissue ulceration or radiopaque foreign body evident by radiography. There is plantar fascia calcification with a nearby inferior calcaneal spur. There are foci of arterial vascular calcification. Electronically Signed   By: Lowella Grip III M.D.   On:  03/21/2017 16:30   Dg Toe 2nd Right  Result Date: 04/06/2017 CLINICAL DATA:  Right second toe open wound. Clinical concern for osteomyelitis. EXAM: RIGHT SECOND TOE COMPARISON:  Right foot dated 03/21/2017. FINDINGS: PA and 2 oblique views of the right second toe were obtained. There is no lateral view. The patient was not able to cooperate for a true lateral view. There is soft tissue irregularity along the medial aspect of the third toe. No fracture, dislocation, bone erosion, periosteal reaction or soft tissue gas seen. IMPRESSION: No radiographic evidence of osteomyelitis. Electronically Signed   By: Claudie Revering M.D.   On: 04/06/2017 18:15      TODAY-DAY OF DISCHARGE:  Subjective:   [name] today has no headache,no chest abdominal pain,no new weakness tingling or numbness, feels much better wants to go home today.   Objective:   Blood pressure (!) 156/65, pulse 85, temperature 98.9 F (37.2 C), temperature source Oral, resp. rate (!) 24, height 5\' 3"  (1.6 m), weight 120.3 kg (265 lb 3.4 oz), last menstrual period 11/27/2012, SpO2 96 %.  Intake/Output Summary (Last 24 hours) at 04/14/17 0928 Last data filed at 04/14/17 0819  Gross per 24 hour  Intake          1784.67 ml  Output             1700 ml  Net            84.67 ml   Filed Weights   04/09/17 0527 04/13/17 0611 04/14/17 0219  Weight: 115.5 kg (254 lb 9.6 oz) 115.3 kg (254 lb 3.2 oz) 120.3 kg (265 lb 3.4 oz)    Exam: Awake Alert, Oriented *3, No new F.N deficits, Normal affect [place].AT,PERRAL Supple Neck,No JVD, No cervical lymphadenopathy appriciated.  Symmetrical Chest wall movement, Good air movement bilaterally, CTAB RRR,No Gallops,Rubs or new Murmurs, No Parasternal Heave +ve B.Sounds, Abd Soft, Non tender, No organomegaly appriciated, No rebound -guarding or rigidity. No Cyanosis, Clubbing or edema, No new Rash or bruise   PERTINENT RADIOLOGIC STUDIES: Dg Tibia/fibula Left  Result Date: 04/07/2017 CLINICAL  DATA:  Cellulitis and open wounds on both legs. EXAM: LEFT TIBIA AND FIBULA - 2 VIEW COMPARISON:  Left foot 03/15/2017.  Left knee 01/07/2016 FINDINGS: No evidence of acute fracture or dislocation of the left tibia or fibula. No focal bone lesion or bone destruction. Bone cortex appears intact. Soft tissue calcifications consistent with vascular calcifications and phleboliths. Infiltration in the subcutaneous fat likely to represent edema or cellulitis. No soft tissue gas collections. No radiopaque foreign bodies. IMPRESSION: No acute bony abnormalities. Subcutaneous soft tissue infiltration likely represent cellulitis or edema. Electronically Signed   By: Lucienne Capers M.D.   On: 04/07/2017 02:33   Dg Tibia/fibula Right  Result Date: 04/07/2017 CLINICAL DATA:  Cellulitis and open  wounds on both legs. EXAM: RIGHT TIBIA AND FIBULA - 2 VIEW COMPARISON:  Right foot 03/21/2017.  Right knee 08/11/2016. FINDINGS: No evidence of acute fracture or dislocation of the right tibia or fibula. No focal bone lesion or bone destruction. Bone cortex appears intact without erosion. Soft tissue calcifications consistent with phleboliths and vascular calcifications. Infiltration of the subcutaneous fat likely to represent edema or cellulitis. No radiopaque soft tissue foreign bodies or gas collections. Soft tissue defect along the distal lateral lower leg consistent with history of ulceration. IMPRESSION: No acute bony abnormalities. Infiltration in the subcutaneous fatty tissues consistent with cellulitis or edema. Electronically Signed   By: Lucienne Capers M.D.   On: 04/07/2017 02:34   Dg Chest Port 1 View  Result Date: 04/07/2017 CLINICAL DATA:  Intubation. EXAM: PORTABLE CHEST 1 VIEW COMPARISON:  04/06/2017 . FINDINGS: Surgical clips left neck. Endotracheal tube, NG tube in stable position. Cardiomegaly with normal pulmonary vascularity. Low lung volumes with mild basilar atelectasis. Tiny left pleural effusion cannot  be excluded. No pneumothorax. IMPRESSION: 1. Lines and tubes in stable position. 2. Low lung volumes with mild basilar atelectasis. Tiny left pleural effusion cannot be excluded. Electronically Signed   By: Marcello Moores  Register   On: 04/07/2017 06:34   Dg Chest Portable 1 View  Result Date: 04/06/2017 CLINICAL DATA:  Endotracheal tube and orogastric tube placement. Initial encounter. EXAM: PORTABLE CHEST 1 VIEW COMPARISON:  Chest radiograph performed 04/06/2017 FINDINGS: The patient's endotracheal tube appears to end at the carina. This should be retracted 3 cm. The patient's enteric tube is seen ending overlying the body of the stomach. There is mild elevation of the left hemidiaphragm. Mild vascular congestion is noted. Mild bibasilar atelectasis seen. No pleural effusion or pneumothorax is seen. The cardiomediastinal silhouette is mildly enlarged. No acute osseous abnormalities are identified. IMPRESSION: 1. Endotracheal tube appears to end at the carina. This should be retracted 3 cm. 2. Enteric tube noted ending overlying the body of the stomach. 3. Mild elevation of the left hemidiaphragm. Mild bibasilar atelectasis seen. 4. Mild vascular congestion and mild cardiomegaly noted. These results were called by telephone at the time of interpretation on 04/06/2017 at 7:44 pm to Dr. Carmin Muskrat, who verbally acknowledged these results. Electronically Signed   By: Garald Balding M.D.   On: 04/06/2017 19:45   Dg Chest Port 1 View  Result Date: 04/06/2017 CLINICAL DATA:  55 y/o  F; possible medication overdose. EXAM: PORTABLE CHEST 1 VIEW COMPARISON:  56 y/o  F; 02/22/2016 chest radiograph. FINDINGS: Normal cardiac silhouette. Aortic atherosclerosis with calcification. Clear lungs. No pleural effusion or pneumothorax. Bones are unremarkable. IMPRESSION: No active disease. Electronically Signed   By: Kristine Garbe M.D.   On: 04/06/2017 16:38   Dg Foot Complete Left  Result Date: 03/15/2017 CLINICAL  DATA:  Cellulitis for 1 month EXAM: LEFT FOOT - COMPLETE 3+ VIEW COMPARISON:  None. FINDINGS: There is no evidence of fracture or dislocation. There is no evidence of arthropathy or other focal bone abnormality. Soft tissues are unremarkable. IMPRESSION: No acute abnormality noted. Electronically Signed   By: Inez Catalina M.D.   On: 03/15/2017 19:39   Dg Foot Complete Right  Result Date: 03/21/2017 CLINICAL DATA:  Nonhealing ulcer second digit EXAM: RIGHT FOOT COMPLETE - 3+ VIEW COMPARISON:  None. FINDINGS: Frontal, oblique, and lateral views obtained. No fracture or dislocation. No erosive change or bony destruction. There is mild subluxation at the second PIP joint. No appreciable joint space narrowing or  erosion. There is an inferior calcaneal spur with plantar calcification at the hindfoot level. There are scattered foci of arterial vascular calcification. There is mild soft tissue swelling dorsally in the mid and forefoot regions. IMPRESSION: Areas of soft tissue swelling. Mild subluxation second PIP joint. No frank dislocation or fracture. No erosive change or bony destruction. No soft tissue ulceration or radiopaque foreign body evident by radiography. There is plantar fascia calcification with a nearby inferior calcaneal spur. There are foci of arterial vascular calcification. Electronically Signed   By: Lowella Grip III M.D.   On: 03/21/2017 16:30   Dg Toe 2nd Right  Result Date: 04/06/2017 CLINICAL DATA:  Right second toe open wound. Clinical concern for osteomyelitis. EXAM: RIGHT SECOND TOE COMPARISON:  Right foot dated 03/21/2017. FINDINGS: PA and 2 oblique views of the right second toe were obtained. There is no lateral view. The patient was not able to cooperate for a true lateral view. There is soft tissue irregularity along the medial aspect of the third toe. No fracture, dislocation, bone erosion, periosteal reaction or soft tissue gas seen. IMPRESSION: No radiographic evidence of  osteomyelitis. Electronically Signed   By: Claudie Revering M.D.   On: 04/06/2017 18:15     PERTINENT LAB RESULTS: CBC:  Recent Labs  04/13/17 0455 04/14/17 0449  WBC 18.1* 15.0*  HGB 9.7* 8.9*  HCT 30.8* 29.0*  PLT 347 325   CMET CMP     Component Value Date/Time   NA 137 04/14/2017 0449   K 3.8 04/14/2017 0449   CL 102 04/14/2017 0449   CO2 27 04/14/2017 0449   GLUCOSE 230 (H) 04/14/2017 0449   BUN 34 (H) 04/14/2017 0449   CREATININE 1.23 (H) 04/14/2017 0449   CREATININE 1.09 (H) 01/25/2016 0947   CALCIUM 8.4 (L) 04/14/2017 0449   PROT 8.4 (H) 04/06/2017 1650   ALBUMIN 3.5 04/06/2017 1650   AST 29 04/06/2017 1650   ALT 33 04/06/2017 1650   ALKPHOS 161 (H) 04/06/2017 1650   BILITOT 0.5 04/06/2017 1650   GFRNONAA 49 (L) 04/14/2017 0449   GFRAA 57 (L) 04/14/2017 0449    GFR Estimated Creatinine Clearance: 65.7 mL/min (A) (by C-G formula based on SCr of 1.23 mg/dL (H)). No results for input(s): LIPASE, AMYLASE in the last 72 hours. No results for input(s): CKTOTAL, CKMB, CKMBINDEX, TROPONINI in the last 72 hours. Invalid input(s): POCBNP No results for input(s): DDIMER in the last 72 hours. No results for input(s): HGBA1C in the last 72 hours. No results for input(s): CHOL, HDL, LDLCALC, TRIG, CHOLHDL, LDLDIRECT in the last 72 hours. No results for input(s): TSH, T4TOTAL, T3FREE, THYROIDAB in the last 72 hours.  Invalid input(s): FREET3 No results for input(s): VITAMINB12, FOLATE, FERRITIN, TIBC, IRON, RETICCTPCT in the last 72 hours. Coags:  Recent Labs  04/13/17 0455 04/14/17 0449  INR 1.08 1.12   Microbiology: Recent Results (from the past 240 hour(s))  Urine culture     Status: Abnormal   Collection Time: 04/06/17  5:05 PM  Result Value Ref Range Status   Specimen Description URINE, RANDOM  Final   Special Requests unknown Normal  Final   Culture >=100,000 COLONIES/mL ESCHERICHIA COLI (A)  Final   Report Status 04/08/2017 FINAL  Final   Organism ID,  Bacteria ESCHERICHIA COLI (A)  Final      Susceptibility   Escherichia coli - MIC*    AMPICILLIN >=32 RESISTANT Resistant     CEFAZOLIN <=4 SENSITIVE Sensitive     CEFTRIAXONE <=  1 SENSITIVE Sensitive     CIPROFLOXACIN >=4 RESISTANT Resistant     GENTAMICIN <=1 SENSITIVE Sensitive     IMIPENEM <=0.25 SENSITIVE Sensitive     NITROFURANTOIN <=16 SENSITIVE Sensitive     TRIMETH/SULFA <=20 SENSITIVE Sensitive     AMPICILLIN/SULBACTAM 16 INTERMEDIATE Intermediate     PIP/TAZO <=4 SENSITIVE Sensitive     Extended ESBL NEGATIVE Sensitive     * >=100,000 COLONIES/mL ESCHERICHIA COLI  Culture, blood (Routine X 2) w Reflex to ID Panel     Status: None   Collection Time: 04/06/17 11:33 PM  Result Value Ref Range Status   Specimen Description BLOOD RIGHT HAND  Final   Special Requests   Final    BOTTLES DRAWN AEROBIC AND ANAEROBIC Blood Culture adequate volume   Culture NO GROWTH 5 DAYS  Final   Report Status 04/12/2017 FINAL  Final  MRSA PCR Screening     Status: None   Collection Time: 04/06/17 11:40 PM  Result Value Ref Range Status   MRSA by PCR NEGATIVE NEGATIVE Final    Comment:        The GeneXpert MRSA Assay (FDA approved for NASAL specimens only), is one component of a comprehensive MRSA colonization surveillance program. It is not intended to diagnose MRSA infection nor to guide or monitor treatment for MRSA infections.   Culture, blood (Routine X 2) w Reflex to ID Panel     Status: None   Collection Time: 04/06/17 11:44 PM  Result Value Ref Range Status   Specimen Description BLOOD LEFT HAND  Final   Special Requests IN PEDIATRIC BOTTLE Blood Culture adequate volume  Final   Culture NO GROWTH 5 DAYS  Final   Report Status 04/12/2017 FINAL  Final  Surgical PCR screen     Status: None   Collection Time: 04/13/17  6:40 AM  Result Value Ref Range Status   MRSA, PCR NEGATIVE NEGATIVE Final   Staphylococcus aureus NEGATIVE NEGATIVE Final    Comment:        The Xpert SA  Assay (FDA approved for NASAL specimens in patients over 78 years of age), is one component of a comprehensive surveillance program.  Test performance has been validated by [facility] for patients greater than or equal to 11 year old. It is not intended to diagnose infection nor to guide or monitor treatment.     FURTHER DISCHARGE INSTRUCTIONS:  Get Medicines reviewed and adjusted: Please take all your medications with you for your next visit with your Primary MD  Laboratory/radiological data: Please request your Primary MD to go over all hospital tests and procedure/radiological results at the follow up, please ask your Primary MD to get all Hospital records sent to his/her office.  In some cases, they will be blood work, cultures and biopsy results pending at the time of your discharge. Please request that your primary care M.D. goes through all the records of your hospital data and follows up on these results.  Also Note the following: If you experience worsening of your admission symptoms, develop shortness of breath, life threatening emergency, suicidal or homicidal thoughts you must seek medical attention immediately by calling 911 or calling your MD immediately  if symptoms less severe.  You must read complete instructions/literature along with all the possible adverse reactions/side effects for all the Medicines you take and that have been prescribed to you. Take any new Medicines after you have completely understood and accpet all the possible adverse reactions/side effects.  Do not drive when taking Pain medications or sleeping medications (Benzodaizepines)  Do not take more than prescribed Pain, Sleep and Anxiety Medications. It is not advisable to combine anxiety,sleep and pain medications without talking with your primary care practitioner  Special Instructions: If you have smoked or chewed Tobacco  in the last 2 yrs please stop smoking, stop any regular Alcohol  and  or any Recreational drug use.  Wear Seat belts while driving.  Please note: You were cared for by a hospitalist during your hospital stay. Once you are discharged, your primary care physician will handle any further medical issues. Please note that NO REFILLS for any discharge medications will be authorized once you are discharged, as it is imperative that you return to your primary care physician (or establish a relationship with a primary care physician if you do not have one) for your post hospital discharge needs so that they can reassess your need for medications and monitor your lab values.  Total Time spent coordinating discharge including counseling, education and face to face time equals45 minutes.  SignedOren Binet 04/14/2017 9:28 AM

## 2017-04-14 NOTE — Progress Notes (Signed)
ANTICOAGULATION CONSULT NOTE - Follow Up Consult  Pharmacy Consult for Heparin -> Apixaban Indication: atrial fibrillation  Allergies  Allergen Reactions  . Gabapentin Nausea And Vomiting    POSSIBLE SHAKING? TAKING MED CURRENTLY    Patient Measurements: Height: 5\' 3"  (160 cm) Weight: 265 lb 3.4 oz (120.3 kg) IBW/kg (Calculated) : 52.4 Heparin Dosing Weight: 80.7 kg  Vital Signs: Temp: 97.9 F (36.6 C) (06/01 0219) Temp Source: Axillary (06/01 0219) BP: 154/68 (06/01 0254) Pulse Rate: 74 (06/01 0219)  Labs:  Recent Labs  04/12/17 0559 04/13/17 0455 04/14/17 0449  HGB 9.6* 9.7* 8.9*  HCT 30.5* 30.8* 29.0*  PLT 330 347 325  LABPROT 14.5 14.0 14.4  INR 1.12 1.08 1.12  HEPARINUNFRC 0.39 0.51 0.36  CREATININE 1.38* 1.32* 1.23*    Estimated Creatinine Clearance: 65.7 mL/min (A) (by C-G formula based on SCr of 1.23 mg/dL (H)).   Assessment: 56 yo female on heparin bridge while off Coumadin. Procedures are complete and now plan to start Apixaban for longterm AC. SCr 1.23, est CrCl 65 ml/min. Hgb down slightly to 8.9.   Goal of Therapy:  Monitor platelets by anticoagulation protocol: Yes   Plan:  Apixaban 5mg  po BID - first dose now. D/c heparin when first dose of apixaban given Case management consulted for new Eliquis  Sherlon Handing, PharmD, BCPS Clinical pharmacist, pager [phone] 04/14/2017 7:10 AM

## 2017-04-14 NOTE — Progress Notes (Signed)
Progress Note  Patient Name: [name] Date of Encounter: 04/14/2017  Primary Cardiologist: [name] (PV)  Subjective   No chest pain. No leg pain  Inpatient Medications    Scheduled Meds: . amiodarone  100 mg Oral Daily  . amLODipine  10 mg Oral Daily  . apixaban  5 mg Oral BID  . aspirin EC  81 mg Oral Daily  . atorvastatin  40 mg Oral q1800  . bisoprolol  2.5 mg Oral Daily  . budesonide (PULMICORT) nebulizer solution  0.5 mg Nebulization BID  . cholecalciferol  1,000 Units Oral Daily  . clopidogrel  75 mg Oral Q breakfast  . DULoxetine  20 mg Oral Daily  . feeding supplement (PRO-STAT SUGAR FREE 64)  30 mL Oral BID  . folic acid  1 mg Oral Daily  . insulin aspart  0-20 Units Subcutaneous TID WC  . insulin aspart  0-5 Units Subcutaneous QHS  . insulin aspart  15 Units Subcutaneous TID WC  . insulin glargine  48 Units Subcutaneous Daily  . levothyroxine  50 mcg Oral Daily  . losartan  25 mg Oral Daily  . multivitamin with minerals  1 tablet Oral Daily  . pantoprazole  40 mg Oral Q1200  . spironolactone  25 mg Oral Daily  . torsemide  40 mg Oral BID  . cyanocobalamin  1,000 mcg Oral Daily   Continuous Infusions: . sodium chloride 10 mL/hr at 04/14/17 0600  . cefTRIAXone (ROCEPHIN)  IV Stopped (04/13/17 2005)  . metronidazole Stopped (04/14/17 0300)   PRN Meds: acetaminophen, diphenhydrAMINE, hydrALAZINE, HYDROcodone-acetaminophen, morphine injection, ondansetron (ZOFRAN) IV   Vital Signs    Vitals:   04/13/17 1913 04/13/17 2000 04/14/17 0219 04/14/17 0254  BP: (!) 122/51  (!) 170/60 (!) 154/68  Pulse: 68  74   Resp: 14 (!) 21 19 12   Temp: 98.3 F (36.8 C)  97.9 F (36.6 C)   TempSrc: Oral  Axillary   SpO2: 90%  94%   Weight:   265 lb 3.4 oz (120.3 kg)   Height:        Intake/Output Summary (Last 24 hours) at 04/14/17 0742 Last data filed at 04/14/17 0600  Gross per 24 hour  Intake          1544.67 ml  Output             1700 ml  Net           -155.33 ml   Filed Weights   04/09/17 0527 04/13/17 0611 04/14/17 0219  Weight: 254 lb 9.6 oz (115.5 kg) 254 lb 3.2 oz (115.3 kg) 265 lb 3.4 oz (120.3 kg)    Telemetry     - Personally Reviewed  ECG      Physical Exam   General: Well developed, well nourished, NAD  HEENT: OP clear, mucus membranes moist  SKIN: warm, dry. No rashes. Neuro: No focal deficits  Musculoskeletal: Muscle strength 5/5 all ext  Psychiatric: Mood and affect normal  Neck: No JVD, no carotid bruits, no thyromegaly, no lymphadenopathy.  Lungs:Clear bilaterally, no wheezes, rhonci, crackles Cardiovascular: Regular rate and rhythm. No murmurs, gallops or rubs. Abdomen:Soft. Bowel sounds present. Non-tender.  Extremities: 1+ bilateral LE edema. Black right second toe.   Labs    Chemistry Recent Labs Lab 04/12/17 0559 04/13/17 0455 04/14/17 0449  NA 136 138 137  K 3.6 3.7 3.8  CL 100* 101 102  CO2 27 28 27   GLUCOSE 225* 189* 230*  BUN  51* 45* 34*  CREATININE 1.38* 1.32* 1.23*  CALCIUM 8.1* 8.5* 8.4*  GFRNONAA 42* 45* 49*  GFRAA 49* 52* 57*  ANIONGAP 9 9 8      Hematology Recent Labs Lab 04/12/17 0559 04/13/17 0455 04/14/17 0449  WBC 19.2* 18.1* 15.0*  RBC 3.30* 3.30* 3.06*  HGB 9.6* 9.7* 8.9*  HCT 30.5* 30.8* 29.0*  MCV 92.4 93.3 94.8  MCH 29.1 29.4 29.1  MCHC 31.5 31.5 30.7  RDW 14.9 15.1 15.5  PLT 330 347 325    Cardiac EnzymesNo results for input(s): TROPONINI in the last 168 hours. No results for input(s): TROPIPOC in the last 168 hours.   BNPNo results for input(s): BNP, PROBNP in the last 168 hours.   DDimer No results for input(s): DDIMER in the last 168 hours.    Radiology    No results Price.  Cardiac Studies   PV procedure: 04/13/17  Procedures Performed:            1. Abdominal aortogram            2. Bilateral iliac angiogram            3. Right lower extremity runoff/contralateral access (second order catheter placement)            4. Placement of 6 mm  spider distal protection device below-the-knee right popliteal artery            5. Hawk 1 directional atherectomy, drug-eluting balloon angioplasty long right SFA CTO  Angiographic Data:   1: Abdominal aortogram-the distal abdominal aorta was free of significant atherosclerotic changes 2: Iliac arteries-widely patent 3: Right lower extremity-occluded right SFA from just beyond the origin with reconstitution in the adductor canal. There did not appear to be any name of both vessels below the knee. The peroneal filled by collaterals.  Final Impression: Successful Hawk 1 directional atherectomy using spider distal protection followed by drug-eluting balloon angioplasty of a long right SFA CTO in the setting of critical limb ischemia with a gangrenous right second toe. I do not think she has the possibility of opening a tibial vessel although I suspect that she may heal a second toe amputation by collateral filling given her widely patent SFA. She did do this 2 years ago. The Ansel sheath was withdrawn across the bifurcation and exchanged over an 035 wire for a short 7 [place] sheath. This was then secured in place. The patient left the lab in stable condition.  [name]. MD, [facility]  Patient Profile     56 y.o. female with history of PAD, DM, systolic CHF, CAD, carotid artery disease, PAF who is admitted with altered mental status and sepsis. She has been seen by Dr. Gwenlyn Price in regards to her PAD and gangrenous right second toe. Underwent PV angiogram with intervention on 04/13/17.   Assessment & Plan    PAD: Had successful atherectomy of right SFA CTO, but unable to open the tibial vessel though has collaterals via patent SFA now. Plan for Plavix and Eliquis, no ASA at this time. Labs stable this morning.   Signed, Karla Bellis, NP  04/14/2017, 7:42 AM    I have personally seen and examined this patient with Karla Bellis, NP. I agree with the assessment and plan as outlined above. She  underwent atherectomy and drug eluting balloon angioplasty of the right SFA yesterday per Dr. Gwenlyn Price. She will continue Plavix and Eliquis. We will arrange f/u with Dr. Gwenlyn Price. OK to d/c home today from [facility] perspective. She  will need to see ortho to discuss amputation of the toe.   Lauree Chandler 04/14/2017 8:16 AM

## 2017-04-14 NOTE — Progress Notes (Signed)
#   1. PATIENT HAS M'CARE EFF-DATE 09/15/2015     MEDICAID OF  EFF-DATE 03/15/2015     CO-PAY- $ 3.70 FOR EACH RX

## 2017-04-17 ENCOUNTER — Other Ambulatory Visit (HOSPITAL_COMMUNITY): Payer: Self-pay | Admitting: Cardiology

## 2017-04-17 DIAGNOSIS — I5043 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) and diastolic (congestive) heart failure: Secondary | ICD-10-CM

## 2017-04-17 DIAGNOSIS — I5022 Chronic systolic (congestive) heart failure: Secondary | ICD-10-CM

## 2017-04-17 MED ORDER — SPIRONOLACTONE 25 MG PO TABS: 25.0000 mg | ORAL_TABLET | Freq: Every day | ORAL | 3 refills | Status: DC

## 2017-04-17 MED ORDER — TORSEMIDE 20 MG PO TABS: 40.0000 mg | ORAL_TABLET | Freq: Two times a day (BID) | ORAL | 3 refills | Status: DC

## 2017-04-17 MED ORDER — POTASSIUM CHLORIDE CRYS ER 20 MEQ PO TBCR: 20.0000 meq | EXTENDED_RELEASE_TABLET | Freq: Every day | ORAL | 3 refills | Status: DC

## 2017-04-17 MED ORDER — AMLODIPINE BESYLATE 10 MG PO TABS: 10.0000 mg | ORAL_TABLET | Freq: Every day | ORAL | 3 refills | Status: DC

## 2017-04-17 MED ORDER — AMIODARONE HCL 100 MG PO TABS: 100.0000 mg | ORAL_TABLET | Freq: Every day | ORAL | 3 refills | Status: DC

## 2017-04-17 MED ORDER — LOSARTAN POTASSIUM 25 MG PO TABS: 25.0000 mg | ORAL_TABLET | Freq: Every day | ORAL | 3 refills | Status: DC

## 2017-04-17 MED ORDER — ATORVASTATIN CALCIUM 40 MG PO TABS: ORAL_TABLET | ORAL | 3 refills | Status: DC

## 2017-04-17 MED ORDER — METOLAZONE 2.5 MG PO TABS: ORAL_TABLET | ORAL | 4 refills | Status: DC

## 2017-04-19 ENCOUNTER — Ambulatory Visit (INDEPENDENT_AMBULATORY_CARE_PROVIDER_SITE_OTHER): Payer: Medicare Other | Admitting: Family

## 2017-04-19 ENCOUNTER — Encounter (INDEPENDENT_AMBULATORY_CARE_PROVIDER_SITE_OTHER): Payer: Self-pay | Admitting: Family

## 2017-04-19 VITALS — Ht 63.0 in | Wt 265.0 lb

## 2017-04-19 DIAGNOSIS — Z720 Tobacco use: Secondary | ICD-10-CM | POA: Diagnosis not present

## 2017-04-19 DIAGNOSIS — I739 Peripheral vascular disease, unspecified: Secondary | ICD-10-CM | POA: Diagnosis not present

## 2017-04-19 DIAGNOSIS — E1151 Type 2 diabetes mellitus with diabetic peripheral angiopathy without gangrene: Secondary | ICD-10-CM | POA: Diagnosis not present

## 2017-04-19 DIAGNOSIS — L03115 Cellulitis of right lower limb: Secondary | ICD-10-CM | POA: Diagnosis not present

## 2017-04-19 DIAGNOSIS — I96 Gangrene, not elsewhere classified: Secondary | ICD-10-CM | POA: Diagnosis not present

## 2017-04-19 MED ORDER — DOXYCYCLINE HYCLATE 100 MG PO TABS: 100.0000 mg | ORAL_TABLET | Freq: Two times a day (BID) | ORAL | 0 refills | Status: DC

## 2017-04-19 NOTE — Progress Notes (Signed)
Office Visit Note   Patient: [name]           Date of Birth: 1961-05-04           MRN: [id number] Visit Date: 04/19/2017              Requested by: Sandi Mariscal, [employer], [place] [address] PCP: Sandi Mariscal, MD  Chief Complaint  Patient presents with  . Right Leg - Wound Check    2nd toe gangrene  . Left Leg - Wound Check    Heel eschar      HPI: The patient is a 56 year old woman who presents today in Hospital follow-up she is seen for evaluation and for amputation of her right second toe. Was recently hospitalized for cellulitis on the hospital with vascular surgery who is a Dr. Sharol Given to to evaluate for amputation of the second toe. There is gangrene to this toe.  She states she's been following at the wound center for multiple ulcerations to bilateral lower extremities. Also has a decubitus ulcer to her left posterior heel. Today is in postop shoes to the bilateral feet. Has not been wearing compression.  States her BSGs have been poorly controlled. Has been checking three times daily. Shows a log of BSGs ranging from 200s to 400s since hospital d/c. Has follow up with PCP tomorrow.   PAD, endorses claudication.   PV procedure: 04/13/17  Procedures Performed: 1. Abdominal aortogram 2. Bilateral iliac angiogram 3. Right lower extremity runoff/contralateral access (second order catheter placement) 4. Placement of 6 mm spider distal protection device below-the-knee right popliteal artery 5. Hawk 1 directional atherectomy, drug-eluting balloon angioplasty long right SFA CTO  Angiographic Data:   1: Abdominal aortogram-the distal abdominal aorta was free of significant atherosclerotic changes 2: Iliac arteries-widely patent 3: Right lower extremity-occluded right SFA from just beyond the origin with reconstitution in the adductor canal. There did not appear to be any name of both vessels below the  knee. The peroneal filled by collaterals.  Assessment & Plan: Visit Diagnoses:  1. PAD (peripheral artery disease) ([name])   2. Gangrene of toe of right foot ([place])   3. DM (diabetes mellitus), type 2 with peripheral vascular complications ([place])   4. Tobacco abuse   5. Cellulitis of right lower extremity     Plan: Will apply unna boots bilaterally today. Will get her set up for amputation of the right second toe. Discussed the plan. Patient in agreement. Will place in a PRAFO on left. Will apply silvadene over left decubitus ulcer.   Follow-Up Instructions: No Follow-up on file.   Ortho Exam  Patient is alert, oriented, no adenopathy, well-dressed, normal affect, normal respiratory effort. Massive pitting edema to bilateral lower extremities. Slight erythema to the left cellulitic erythema with induration on the right. There are scattered ulceration to the bilateral lower extremities as well these are filled in with 90% exudative tissue. There is a posterior decubitus ulcer to the left heel this is 3 cm x 1 cm this is covered with eschar. There is no drainage no surrounding erythema. There is no weeping to the bilateral lower extremities. There is dry gangrene of the right second toe up to the PIP. No sausage digit swelling.  Imaging: No results found.  Labs: Lab Results  Component Value Date   HGBA1C 11.1 (H) 10/15/2015   HGBA1C 12.8 (H) 09/05/2015   HGBA1C 9.4 (H) 06/24/2015   LABURIC 15.0 (H) 11/26/2015   REPTSTATUS 04/12/2017 FINAL  04/06/2017   GRAMSTAIN  11/24/2015    FEW WBC PRESENT, PREDOMINANTLY MONONUCLEAR RARE SQUAMOUS EPITHELIAL CELLS PRESENT NO ORGANISMS SEEN Performed at [facility]    CULT NO GROWTH 5 DAYS 04/06/2017   LABORGA ESCHERICHIA COLI (A) 04/06/2017    Orders:  No orders of the defined types were placed in this encounter.  Meds ordered this encounter  Medications  . doxycycline (VIBRA-TABS) 100 MG tablet    Sig: Take 1 tablet (100 mg total)  by mouth 2 (two) times daily.    Dispense:  28 tablet    Refill:  0     Procedures: No procedures performed  Clinical Data: No additional findings.  ROS:  All other systems negative, except as noted in the HPI. Review of Systems  Constitutional: Negative for chills and fever.  Respiratory: Negative for shortness of breath.   Cardiovascular: Positive for leg swelling. Negative for chest pain.  Skin: Positive for color change and wound. Negative for rash.    Objective: Vital Signs: Ht 5\' 3"  (1.6 m)   Wt 265 lb (120.2 kg)   LMP 11/27/2012   BMI 46.94 kg/m   Specialty Comments:  No specialty comments available.  PMFS History: Patient Active Problem List   Diagnosis Date Noted  . PAD (peripheral artery disease) ([place])   . Gangrene of toe of right foot ([place])   . Severe sepsis ([place]) 04/06/2017  . CKD (chronic kidney disease), stage III 11/24/2016  . Suspected sleep apnea 11/24/2016  . Severe obesity (BMI >= 40) ([place]) 02/24/2016  . COPD GOLD 0  02/24/2016  . Chronic systolic CHF (congestive heart failure) ([place]) 02/12/2016  . Encounter for therapeutic drug monitoring 02/10/2016  . Symptomatic bradycardia 01/12/2016  . Essential hypertension 12/22/2015  . Wheeze   . 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 Tobacco abuse 10/23/2015  . Coronary artery disease involving native coronary artery of native heart   . DOE (dyspnea on exertion) 04/29/2015  . PAF (paroxysmal atrial fibrillation) ([place]) 01/16/2015  . Carotid arterial disease ([place]) 01/16/2015  . Claudication ([place]) 01/15/2015  . Demand ischemia ([place]) 10/29/2014  . Insomnia 02/03/2014  . S/P peripheral artery angioplasty - TurboHawk atherectomy; R SFA 09/11/2013    Class: Acute  . Leg pain, bilateral 08/19/2013  . Hypothyroidism 07/31/2013  . Cellulitis 06/13/2013  . History of cocaine abuse 06/13/2013  . Long term current use of anticoagulant therapy 05/20/2013  . Alcohol abuse   . Narcotic abuse   . Marijuana  abuse   . Alcoholic cirrhosis ([place])   . DM (diabetes mellitus), type 2 with peripheral vascular complications [facility])    Past Medical History:  Diagnosis Date  . Alcohol abuse   . Alcoholic cirrhosis ([place])   . Alcoholic cirrhosis ([place])   . Anemia   . Anxiety   . B12 deficiency   . CAD (coronary artery disease)    a. cath 11/10/2014 LM nl, LAD min irregs, D1 30 ost, D2 50d, LCX 26m, OM1 80 p/m (1.5 mm vessel), OM2 51m, RCA nondom 38m-->med rx.. Demand ischemia in the setting of rapid a-fib.  . Cardiomyopathy ([place])   . Carotid artery disease ([place])    a. 01/2015 Carotid Angio: RICA 008, LICA 67Y. s/p L carotid endarterectomy 02/2015.  [name] Chronic combined systolic and diastolic CHF (congestive heart failure) ([place])    a. Last echo 12/205: EF 40-45%, inferoapical/posterior HK, not technically sufficient to allow eval of LV diastolic function, normal LA in size..  . Cocaine abuse   .  COPD (chronic obstructive pulmonary disease) ([place])   . Critical lower limb ischemia   . Diabetic peripheral neuropathy ([place])   . Elevated troponin    a. Chronic elevation.  [name] GERD (gastroesophageal reflux disease)   . Hyperlipemia   . Hypertension   . Hypokalemia   . Hypomagnesemia   . Hypothyroidism   . Marijuana abuse   . Narcotic abuse   . Noncompliance   . NSVT (nonsustained ventricular tachycardia) ([place])   . Obesity   . PAF (paroxysmal atrial fibrillation) ([place])   . Paroxysmal atrial tachycardia ([place])   . Paroxysmal atrial tachycardia ([place])   . Peripheral arterial disease ([place])    a. 01/2015 Angio/PTA: LSFA 100 w/ recon @ adductor canal and 1 vessel runoff via AT, RSFA 99 (atherectomy/pta) - 1 vessel runoff via diff dzs peroneal.  . Poorly controlled type 2 diabetes mellitus ([place])   . Renal insufficiency    a. 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  [name] Symptomatic bradycardia    avoid AV blocking agent per EP  . Tobacco abuse     Family History  Problem Relation Age of Onset  . Hypertension Mother   . Diabetes  Mother   . Cancer Mother        breast, ovarian, colon  . Clotting disorder Mother   . Heart disease Mother   . Heart attack Mother   . Breast cancer Mother   . Hypertension Father   . Heart disease Father   . Emphysema Sister        smoked    Past Surgical History:  Procedure Laterality Date  . BALLOON ANGIOPLASTY, ARTERY Right 01/15/2015   SFA/notes 01/15/2015  . CARDIAC CATHETERIZATION    . CARDIAC CATHETERIZATION N/A 01/12/2016   Procedure: Temporary Wire;  Surgeon: Minus Breeding, MD;  Location: [place] CV LAB;  Service: Cardiovascular;  Laterality: N/A;  . CARDIOVERSION  ~ 02/2013   "twice"   . CAROTID ANGIOGRAM N/A 01/15/2015   Procedure: CAROTID ANGIOGRAM;  Surgeon: [name], MD;  Location: [facility] CATH LAB;  Service: Cardiovascular;  Laterality: N/A;  . DILATION AND CURETTAGE OF UTERUS  1988  . ENDARTERECTOMY Left 02/19/2015   Procedure: LEFT CAROTID ENDARTERECTOMY ;  Surgeon: Serafina Mitchell, MD;  Location: [place];  Service: Vascular;  Laterality: Left;  . LEFT HEART CATHETERIZATION WITH CORONARY ANGIOGRAM N/A 10/31/2014   Procedure: LEFT HEART CATHETERIZATION WITH CORONARY ANGIOGRAM;  Surgeon: Burnell Blanks, MD;  Location: [facility] CATH LAB;  Service: Cardiovascular;  Laterality: N/A;  . LOWER EXTREMITY ANGIOGRAM N/A 09/10/2013   Procedure: LOWER EXTREMITY ANGIOGRAM;  Surgeon: [name], MD;  Location[facility] CATH LAB;  Service: Cardiovascular;  Laterality: N/A;  . LOWER EXTREMITY ANGIOGRAM N/A 01/15/2015   Procedure: LOWER EXTREMITY ANGIOGRAM;  Surgeon: [name], MD;  Location: [facility] CATH LAB;  Service: Cardiovascular;  Laterality: N/A;  . LOWER EXTREMITY ANGIOGRAPHY N/A 04/13/2017   Procedure: Lower Extremity Angiography;  Surgeon: [name], MD;  Location: [place] CV LAB;  Service: Cardiovascular;  Laterality: N/A;  . PERIPHERAL ATHRECTOMY Right 01/15/2015   SFA/notes 01/15/2015  . PERIPHERAL VASCULAR INTERVENTION  04/13/2017   Procedure: Peripheral  Vascular Intervention;  Surgeon: [name], MD;  Location: [place] CV LAB;  Service: Cardiovascular;;   Social History   Occupational History  . disabled    Social History Main Topics  . Smoking status: Current Some Day Smoker    Packs/day: 0.00    Years: 40.00    Types:  E-cigarettes    Last attempt to quit: 11/03/2015  . Smokeless tobacco: Former Systems developer    Types: Snuff  . Alcohol use No     Comment: former alcohol abuse  . Drug use: No     Comment: 04/29/2015 "last drug use was ~ 09/08/2013"  . Sexual activity: No

## 2017-04-20 ENCOUNTER — Other Ambulatory Visit (HOSPITAL_COMMUNITY): Payer: Self-pay

## 2017-04-20 ENCOUNTER — Other Ambulatory Visit: Payer: Self-pay | Admitting: Cardiovascular Disease

## 2017-04-20 DIAGNOSIS — I739 Peripheral vascular disease, unspecified: Secondary | ICD-10-CM

## 2017-04-20 NOTE — Progress Notes (Signed)
Paramedicine Encounter    Patient ID: [name], female    DOB: 04/15/1961, 56 y.o.   MRN: [id number]   Patient Care Team: Sandi Mariscal, MD as PCP - General (Internal Medicine) [name], MD as Consulting Physician (Cardiology) [name], MD as Consulting Physician (Pulmonary Disease)  Patient Active Problem List   Diagnosis Date Noted  . PAD (peripheral artery disease) ([place])   . Gangrene of toe of right foot ([place])   . Severe sepsis ([place]) 04/06/2017  . CKD (chronic kidney disease), stage III 11/24/2016  . Suspected sleep apnea 11/24/2016  . Severe obesity (BMI >= 40) ([place]) 02/24/2016  . COPD GOLD 0  02/24/2016  . Chronic systolic CHF (congestive heart failure) ([place]) 02/12/2016  . Encounter for therapeutic drug monitoring 02/10/2016  . Symptomatic bradycardia 01/12/2016  . Essential hypertension 12/22/2015  . Wheeze   . 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 Tobacco abuse 10/23/2015  . Coronary artery disease involving native coronary artery of native heart   . DOE (dyspnea on exertion) 04/29/2015  . PAF (paroxysmal atrial fibrillation) ([place]) 01/16/2015  . Carotid arterial disease ([place]) 01/16/2015  . Claudication ([place]) 01/15/2015  . Demand ischemia ([place]) 10/29/2014  . Insomnia 02/03/2014  . S/P peripheral artery angioplasty - TurboHawk atherectomy; R SFA 09/11/2013    Class: Acute  . Leg pain, bilateral 08/19/2013  . Hypothyroidism 07/31/2013  . Cellulitis 06/13/2013  . History of cocaine abuse 06/13/2013  . Long term current use of anticoagulant therapy 05/20/2013  . Alcohol abuse   . Narcotic abuse   . Marijuana abuse   . Alcoholic cirrhosis ([place])   . DM (diabetes mellitus), type 2 with peripheral vascular complications ([name])     Current Outpatient Prescriptions:  .  albuterol (PROVENTIL HFA;VENTOLIN HFA) 108 (90 Base) MCG/ACT inhaler, Inhale 2 puffs into the lungs every 6 (six) hours as needed for wheezing or shortness of breath., Disp: 1 each, Rfl: 3 .   albuterol (PROVENTIL) (2.5 MG/3ML) 0.083% nebulizer solution, Take 3 mLs (2.5 mg total) by nebulization every 6 (six) hours as needed. For wheezing., Disp: 75 mL, Rfl: 2 .  Amino Acids-Protein Hydrolys (FEEDING SUPPLEMENT, PRO-STAT SUGAR FREE 64,) LIQD, Take 30 mLs by mouth 2 (two) times daily., Disp: 60 Bottle, Rfl: 0 .  amiodarone (PACERONE) 100 MG tablet, Take 1 tablet (100 mg total) by mouth daily., Disp: 30 tablet, Rfl: 3 .  amLODipine (NORVASC) 10 MG tablet, Take 1 tablet (10 mg total) by mouth daily., Disp: 30 tablet, Rfl: 3 .  apixaban (ELIQUIS) 5 MG TABS tablet, Take 1 tablet (5 mg total) by mouth 2 (two) times daily., Disp: 60 tablet, Rfl: 0 .  atorvastatin (LIPITOR) 40 MG tablet, TAKE 1 TABLET BY MOUTH ONCE EVERY EVENING, Disp: 90 tablet, Rfl: 3 .  bisoprolol (ZEBETA) 5 MG tablet, TAKE 1/2 TABLET BY MOUTH EVERY DAY, Disp: 15 tablet, Rfl: 2 .  cholecalciferol (VITAMIN D) 1000 units tablet, Take 1,000 Units by mouth daily., Disp: , Rfl:  .  clopidogrel (PLAVIX) 75 MG tablet, Take 1 tablet (75 mg total) by mouth daily with breakfast., Disp: 30 tablet, Rfl: 0 .  cyanocobalamin 1000 MCG tablet, Take 1 tablet (1,000 mcg total) by mouth daily., Disp: 30 tablet, Rfl: 10 .  doxycycline (VIBRA-TABS) 100 MG tablet, Take 1 tablet (100 mg total) by mouth 2 (two) times daily., Disp: 28 tablet, Rfl: 0 .  folic acid (FOLVITE) 1 MG tablet, TAKE 1 TABLET BY MOUTH EVERY DAY, Disp: 90  tablet, Rfl: 1 .  gabapentin (NEURONTIN) 300 MG capsule, Take 300 mg by mouth 2 (two) times daily. , Disp: , Rfl:  .  insulin aspart (NOVOLOG FLEXPEN) 100 UNIT/ML FlexPen, Inject 25 Units into the skin 3 (three) times daily with meals., Disp: 15 mL, Rfl: 11 .  Insulin Glargine (LANTUS SOLOSTAR) 100 UNIT/ML Solostar Pen, Inject 48 Units into the skin daily at 10 pm., Disp: 15 mL, Rfl: 11 .  levothyroxine (SYNTHROID, LEVOTHROID) 50 MCG tablet, Take 1 tablet (50 mcg total) by mouth daily., Disp: 30 tablet, Rfl: 2 .  losartan  (COZAAR) 25 MG tablet, Take 1 tablet (25 mg total) by mouth daily., Disp: 30 tablet, Rfl: 3 .  metolazone (ZAROXOLYN) 2.5 MG tablet, TAKE 1 TABLET BY MOUTH ONCE A WEEK ON THURSDAY. MAY TAKE AN ADDITIONAL TABLET AS DIRECTED BY HF CLINIC., Disp: 5 tablet, Rfl: 4 .  Multiple Vitamin (MULTIVITAMIN) tablet, Take 1 tablet by mouth daily., Disp: , Rfl:  .  potassium chloride SA (K-DUR,KLOR-CON) 20 MEQ tablet, Take 1 tablet (20 mEq total) by mouth daily. Take an additional 20 meq (1 tab) on Thursday with metolazone, Disp: 35 tablet, Rfl: 3 .  spironolactone (ALDACTONE) 25 MG tablet, Take 1 tablet (25 mg total) by mouth daily., Disp: 90 tablet, Rfl: 3 .  torsemide (DEMADEX) 20 MG tablet, Take 2 tablets (40 mg total) by mouth 2 (two) times daily., Disp: 360 tablet, Rfl: 3 .  acetaminophen-codeine (TYLENOL #3) 300-30 MG tablet, Take 1 tablet by mouth 2 (two) times daily as needed for pain., Disp: , Rfl: 0 .  amoxicillin-clavulanate (AUGMENTIN) 875-125 MG tablet, Take 1 tablet by mouth 2 (two) times daily. (Patient not taking: Reported on 04/20/2017), Disp: 10 tablet, Rfl: 0 .  oxyCODONE-acetaminophen (PERCOCET/ROXICET) 5-325 MG tablet, Take 2 tablets by mouth every 4 (four) hours as needed for severe pain. (Patient not taking: Reported on 04/20/2017), Disp: 15 tablet, Rfl: 0 Allergies  Allergen Reactions  . Gabapentin Nausea And Vomiting    POSSIBLE SHAKING? TAKING MED CURRENTLY     Social History   Social History  . Marital status: Single    Spouse name: N/A  . Number of children: N/A  . Years of education: N/A   Occupational History  . disabled    Social History Main Topics  . Smoking status: Current Some Day Smoker    Packs/day: 0.00    Years: 40.00    Types: E-cigarettes    Last attempt to quit: 11/03/2015  . Smokeless tobacco: Former Systems developer    Types: Snuff  . Alcohol use No     Comment: former alcohol abuse  . Drug use: No     Comment: 04/29/2015 "last drug use was ~ 09/08/2013"  . Sexual  activity: No   Other Topics Concern  . Not on file   Social History Narrative   Lives in [place], in [place] with sister.  They are looking to move but don't have a place to go yet.      Physical Exam      Future Appointments Date Time Provider [place]  04/28/2017 12:30 PM [facility]  05/16/2017 11:15 AM [name], MD [facility]   BP 140/80   Pulse 92   Resp 18   Wt 256 lb (116.1 kg)   LMP 11/27/2012   SpO2 98%   BMI 45.35 kg/m  Weight yesterday-258 Last visit weight-256 [facility] EMS-299  Pt recently home from hosp, she is going to  have a toe amputation. Pt is in a lot of pain and discomfort on her legs, she is very tearful due to the pains. meds verified and pill box refilled accordingly. She does not have the metolazone to take today-I called the pharmacy and they will bring it out today. She has her legs wrapped and one of them is in a boot. She has very low mobility around the house.  She reports the home health nurse will be sent out after her surgery but PT may come on out. Will f/u next week.   ACTION: Home visit completed Next visit planned for next week   Marylouise Stacks, EMT-Paramedic 04/20/17

## 2017-04-25 ENCOUNTER — Encounter (HOSPITAL_BASED_OUTPATIENT_CLINIC_OR_DEPARTMENT_OTHER): Payer: Medicare Other | Attending: Surgery

## 2017-04-26 ENCOUNTER — Telehealth (INDEPENDENT_AMBULATORY_CARE_PROVIDER_SITE_OTHER): Payer: Self-pay | Admitting: Radiology

## 2017-04-26 IMAGING — DX DG HIP (WITH OR WITHOUT PELVIS) 2-3V*R*
3 series · 3 of 3 positions shown · non-contrast
Comparison: None.

CLINICAL DATA: 54-year-old female with fall and right hip pain.
54-year-old female with fall and right hip pain.

EXAM:
DG HIP (WITH OR WITHOUT PELVIS) 2-3V RIGHT

[pelvis ap]
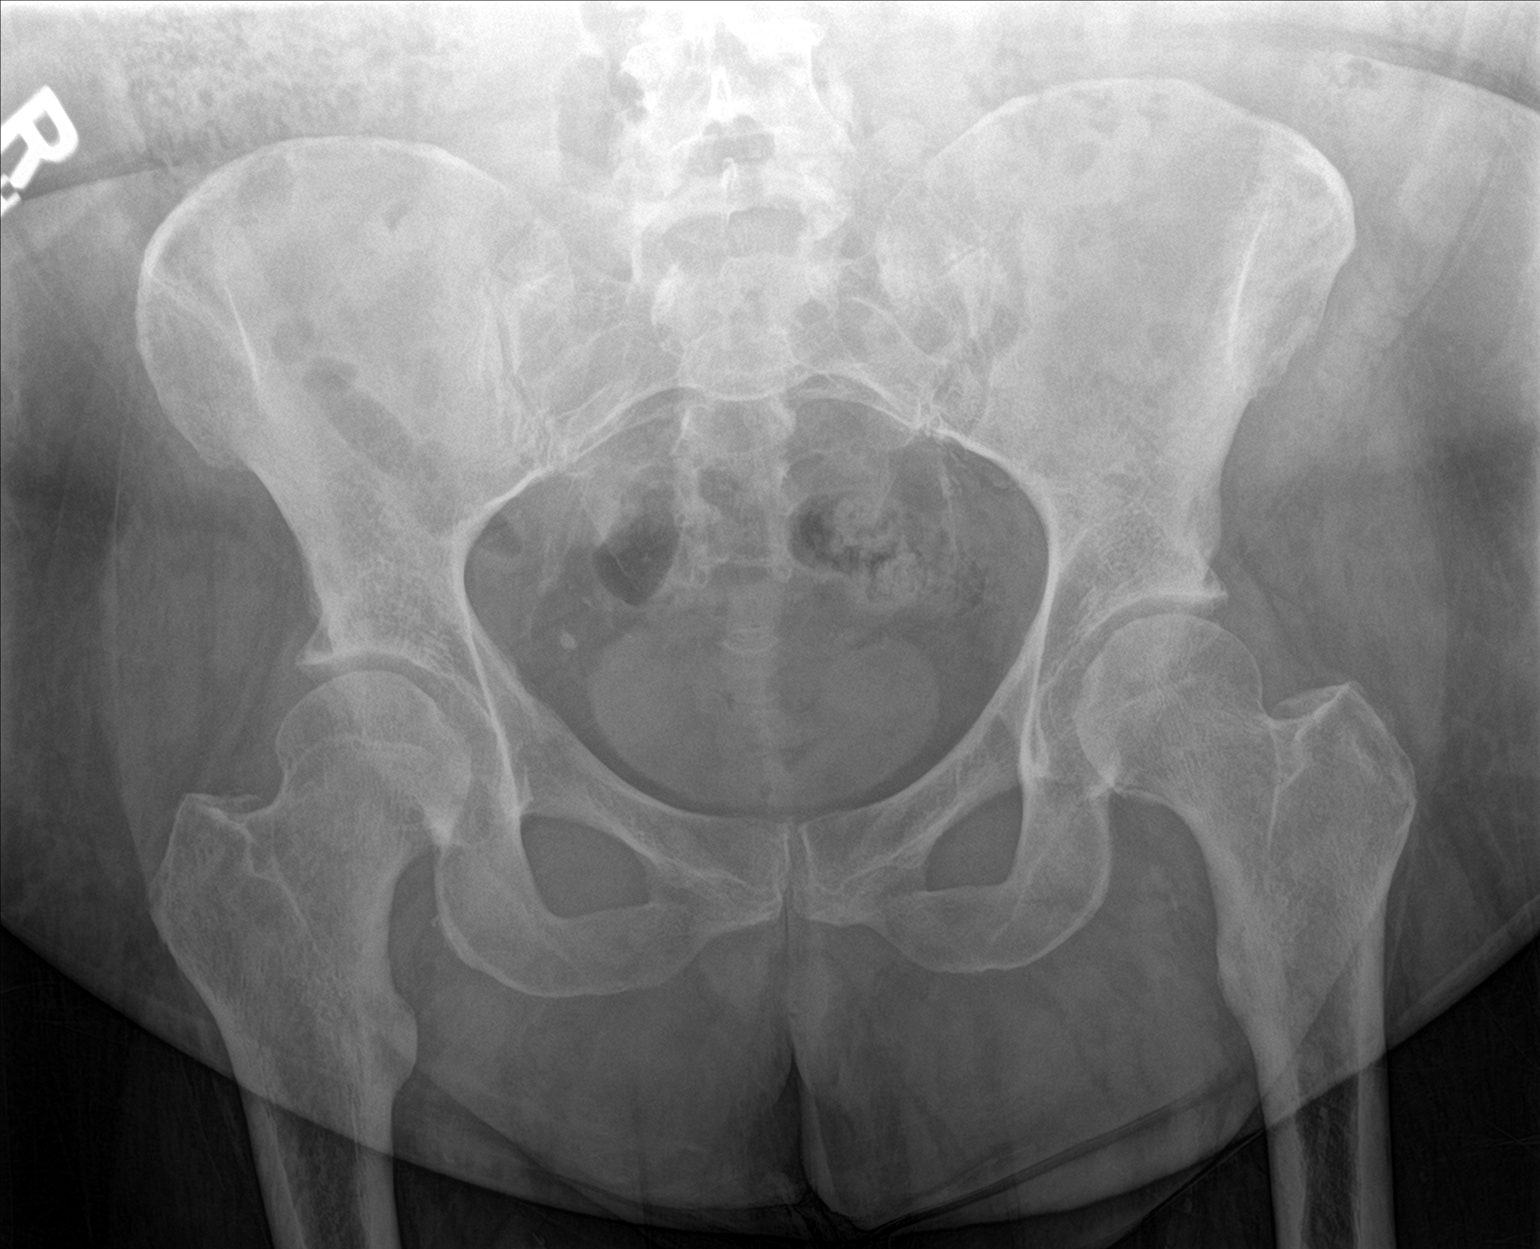

[hip ap]
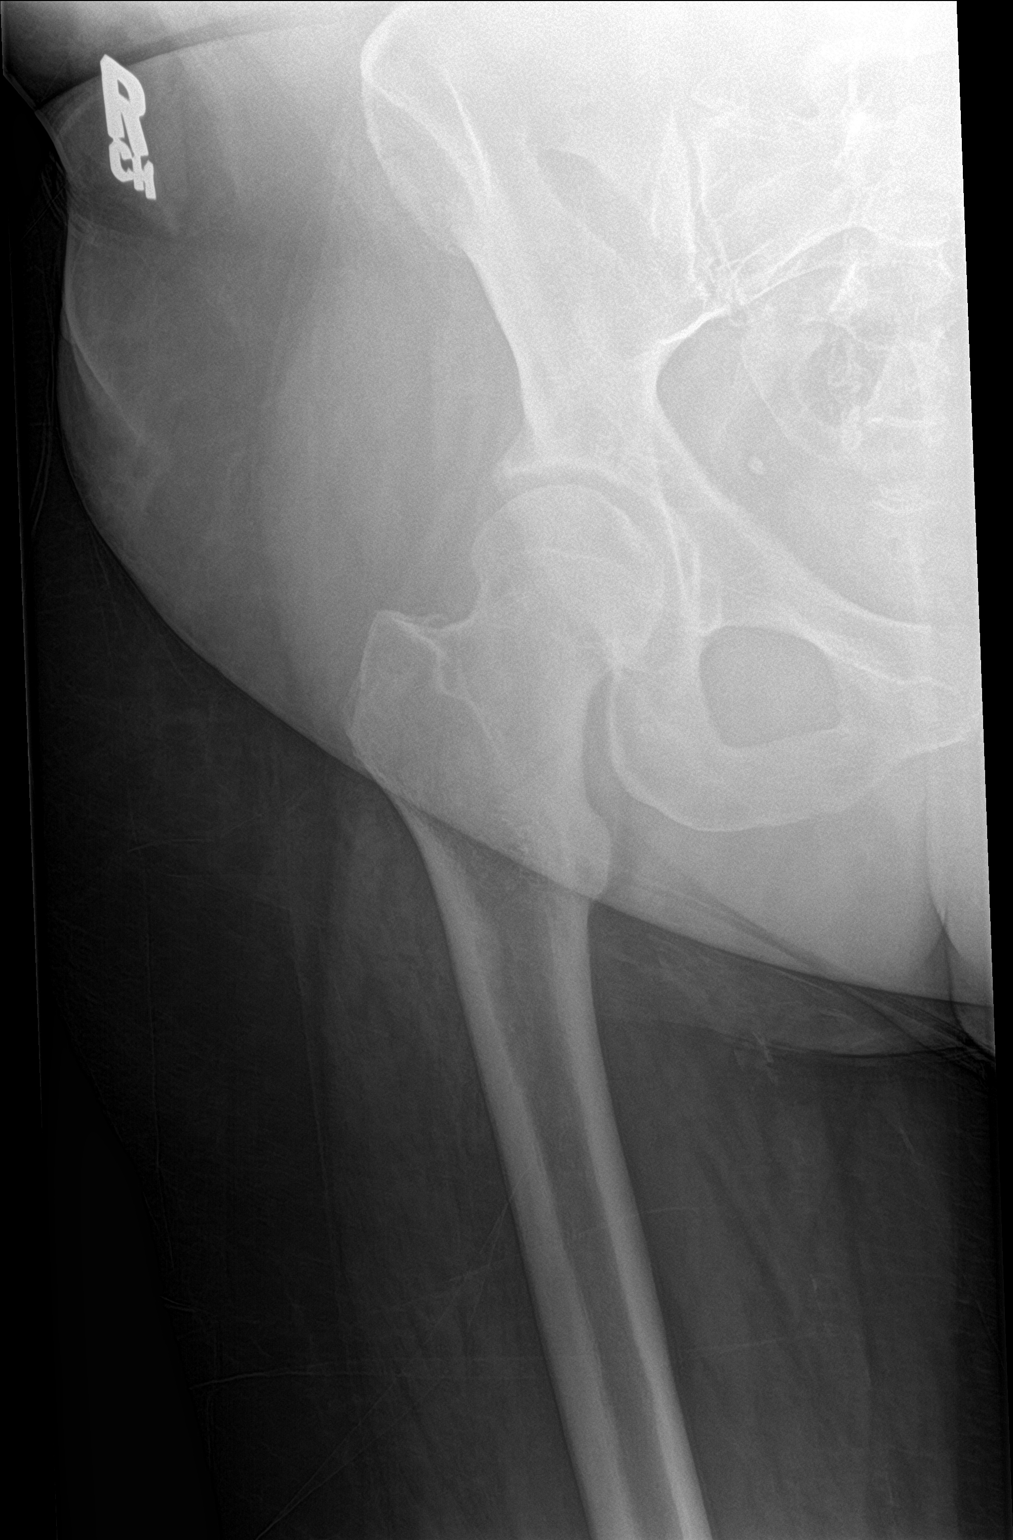

[hip lat]
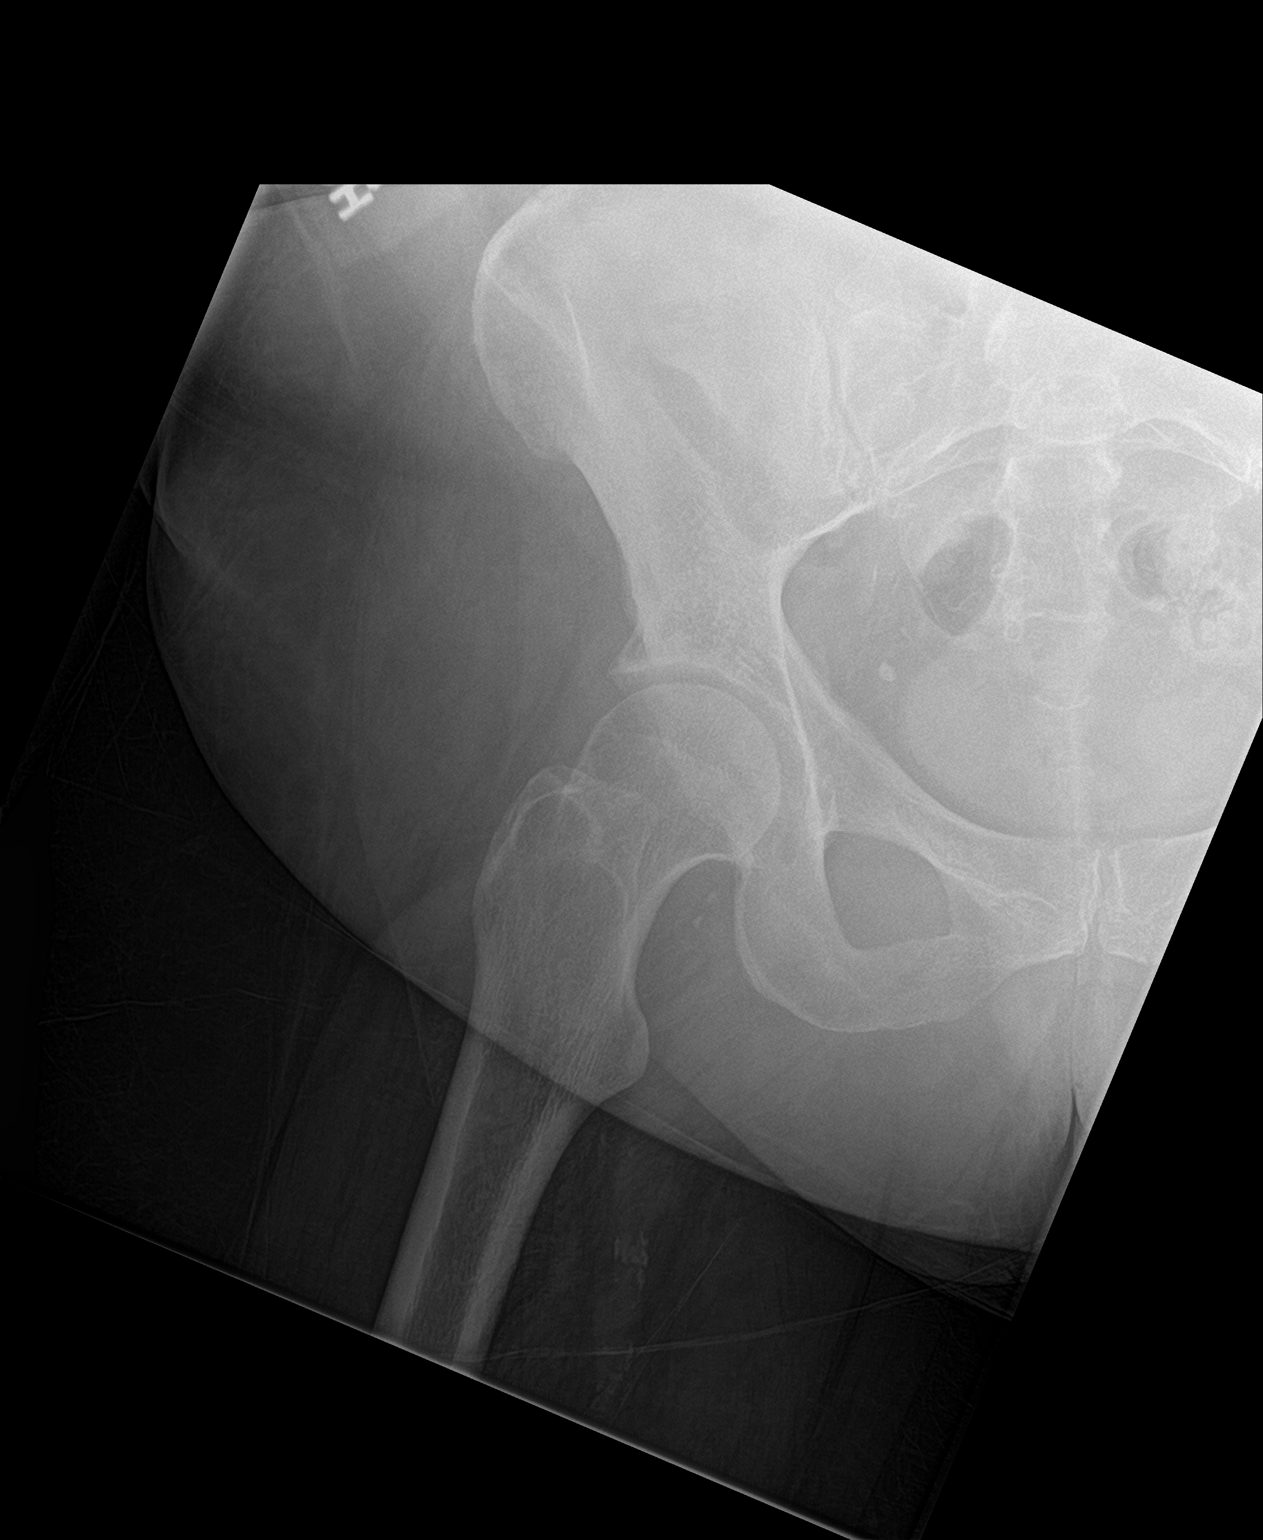

[3 of 3 positions shown; findings below may reference images not displayed]

FINDINGS: S there is no acute fracture or dislocation. The bones are
osteopenic. There is mild bilateral hip osteoarthritic changes. The
soft tissues are grossly unremarkable. No radiopaque foreign object.
IMPRESSION: No acute fracture or dislocation.

## 2017-04-26 IMAGING — DX DG KNEE COMPLETE 4+V*R*
4 series · 4 of 4 positions shown · non-contrast
Comparison: None.

CLINICAL DATA: Right hip pain after fall.

EXAM:
RIGHT KNEE - COMPLETE 4+ VIEW

[knee ap]
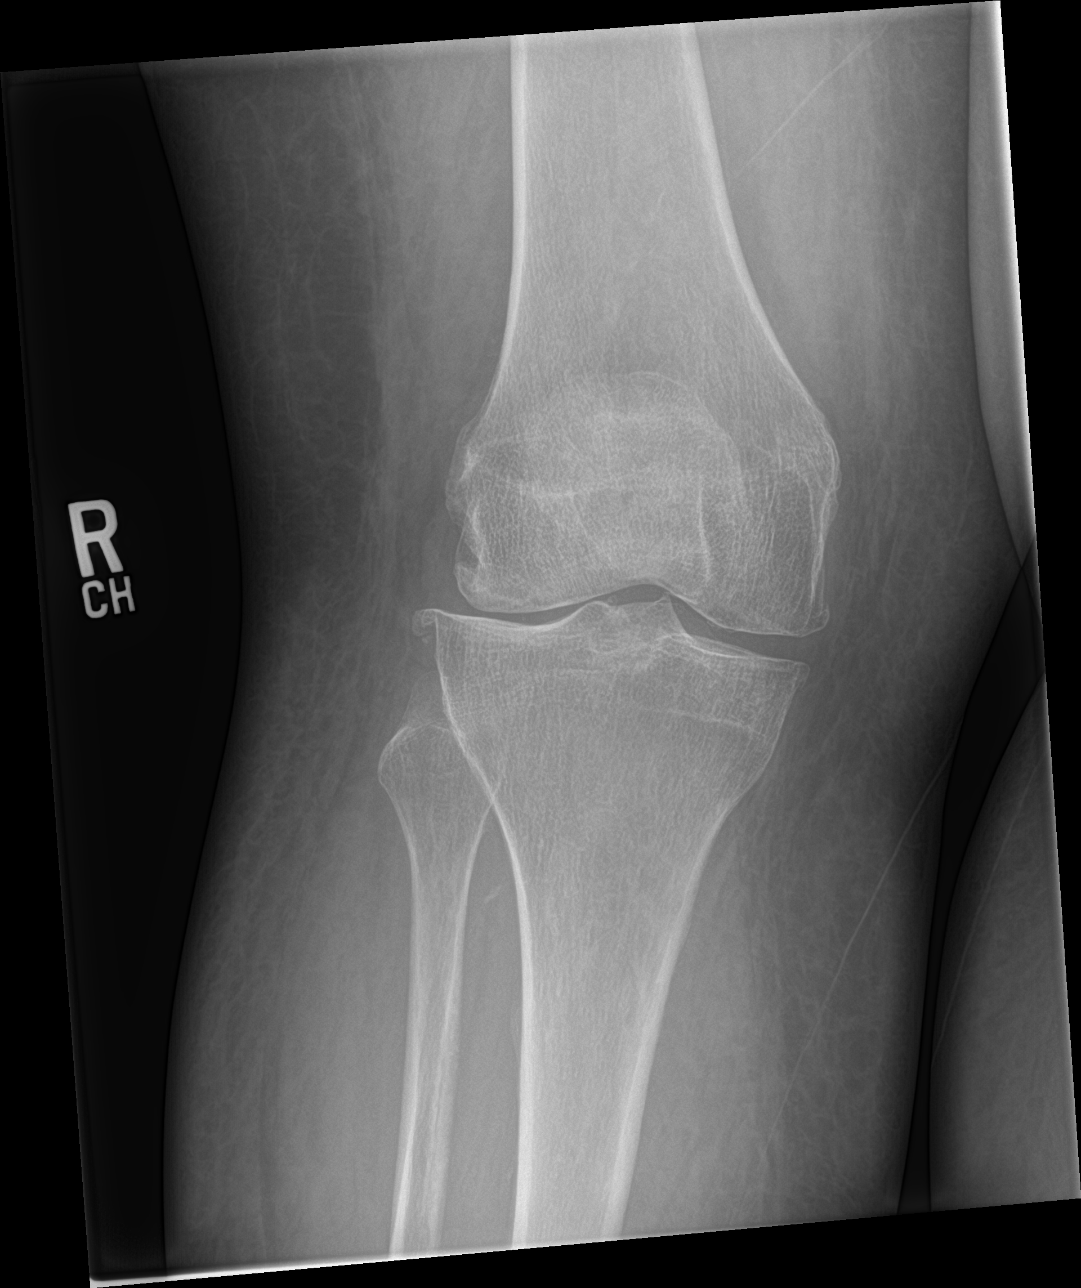

[knee lat]
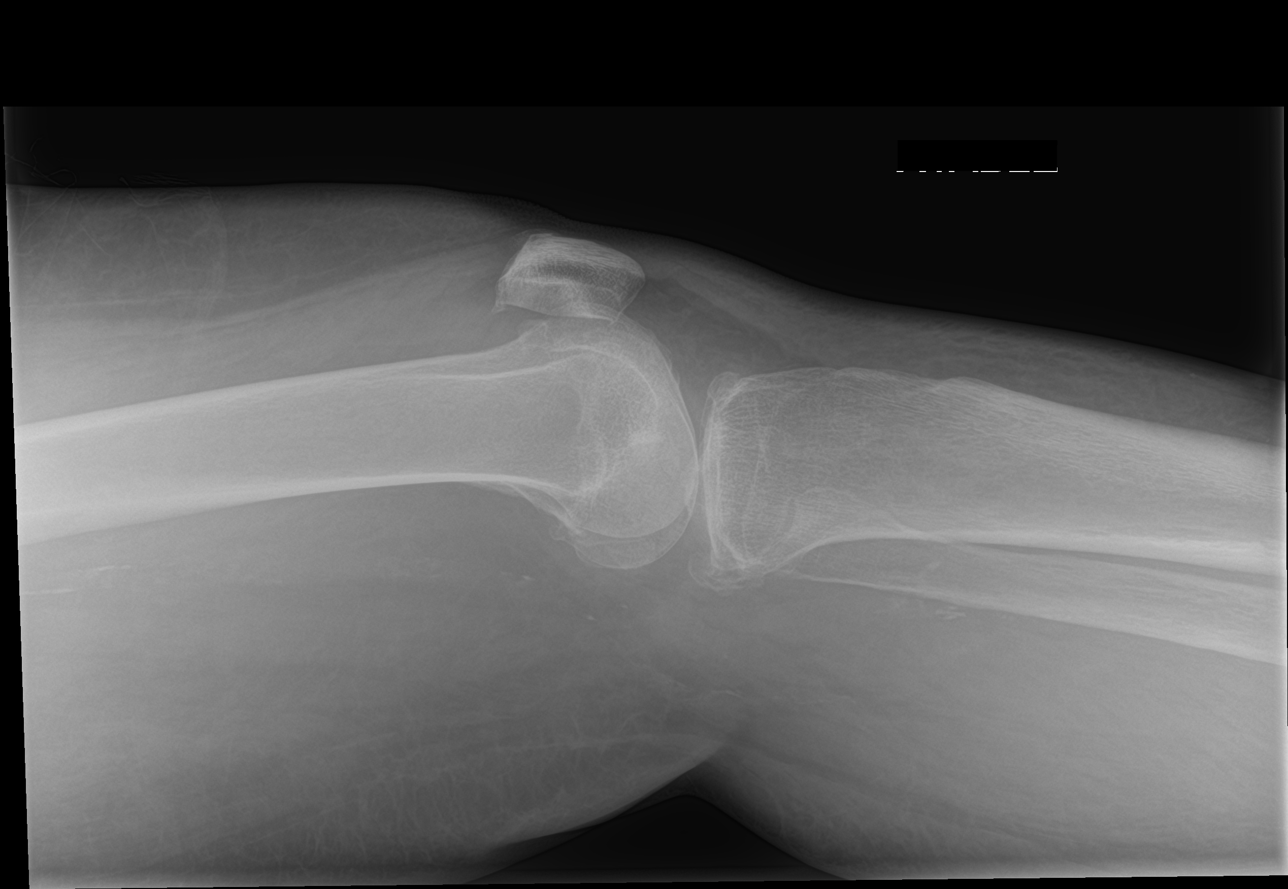

[knee obl (1 of 2)]
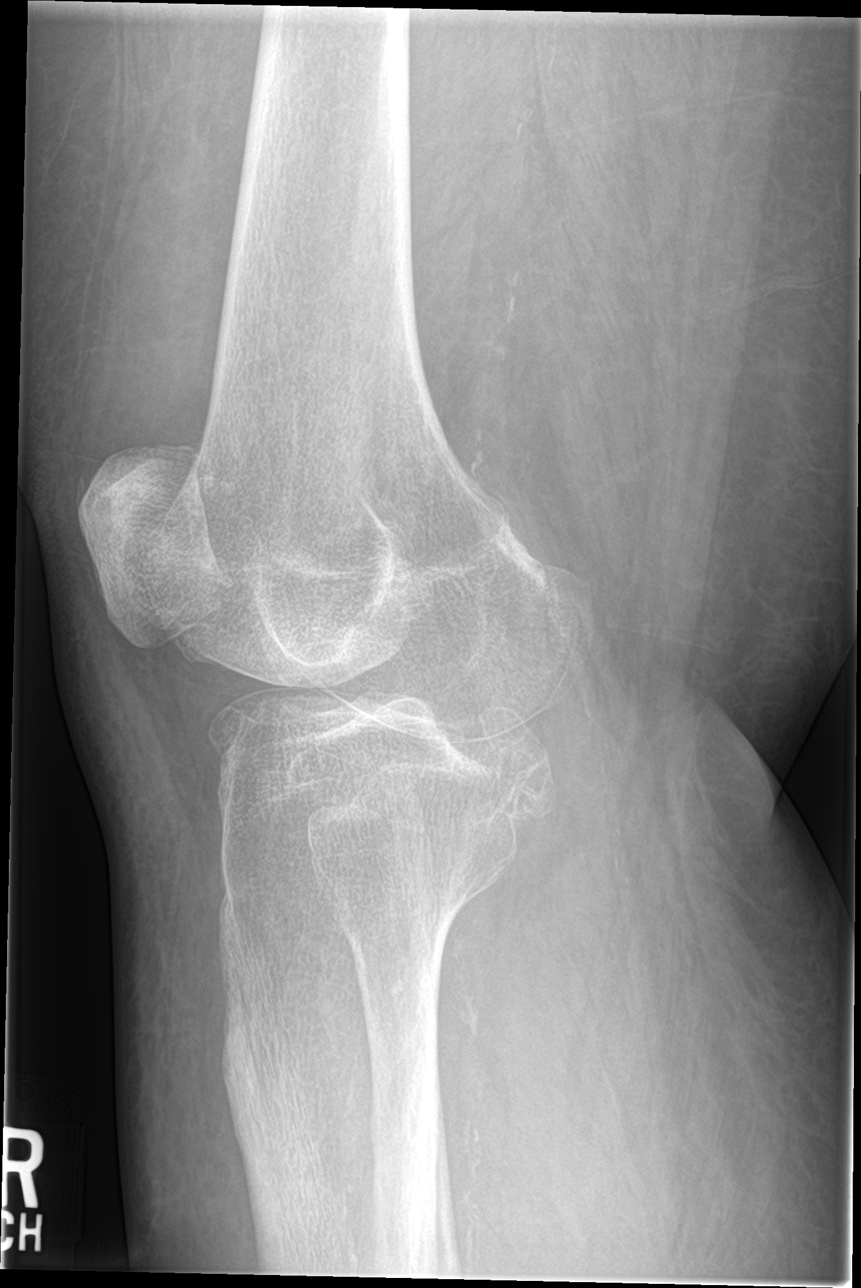

[knee obl (2 of 2)]
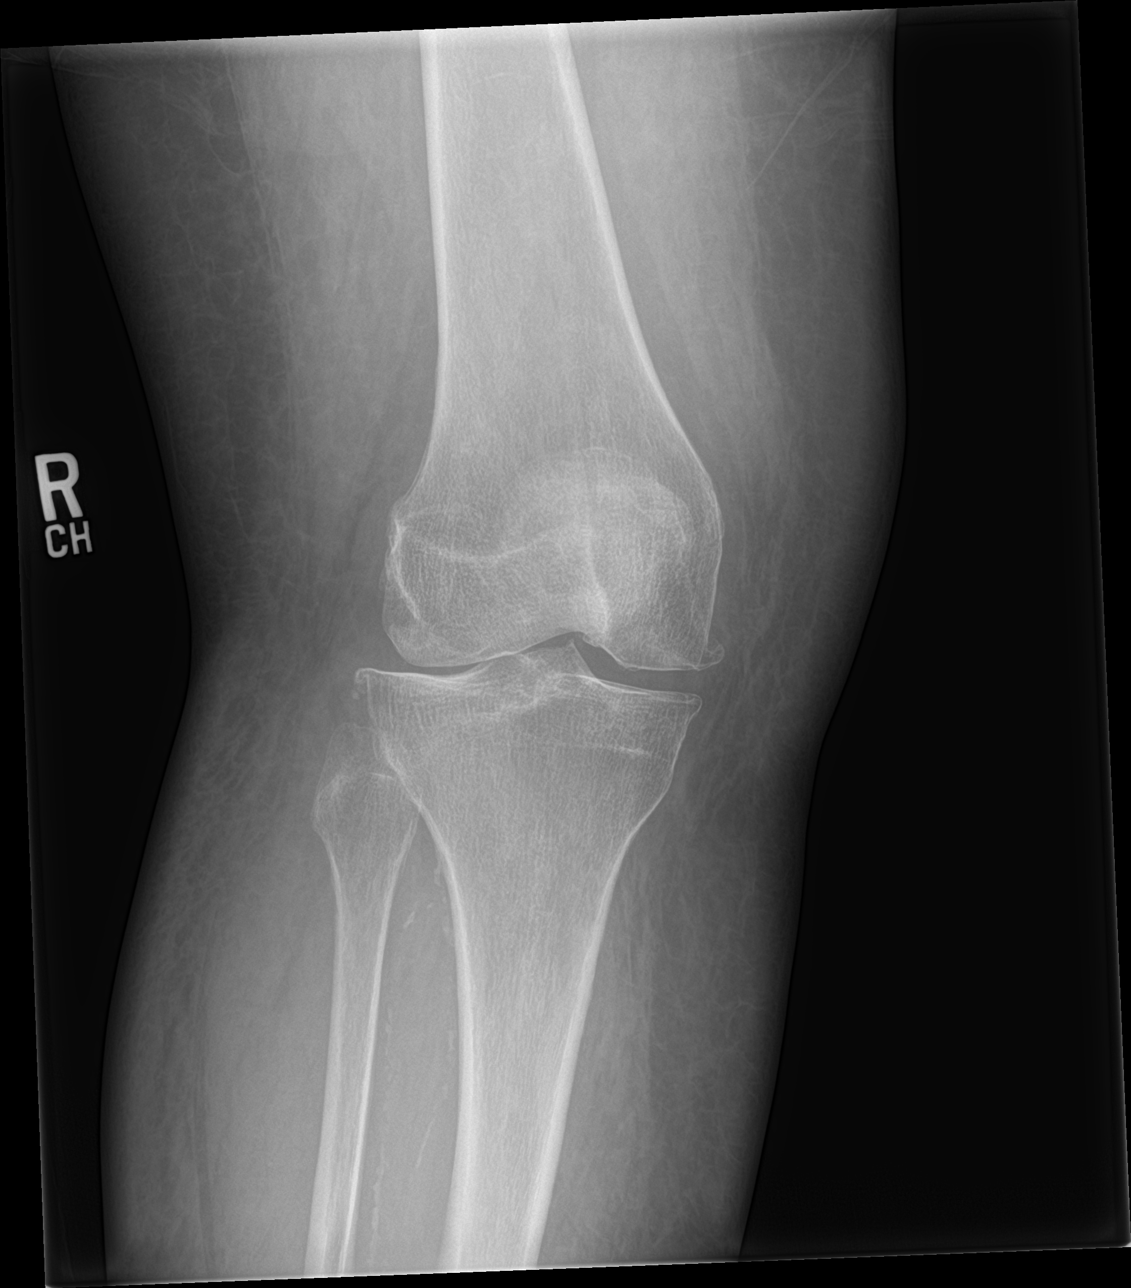

[4 of 4 positions shown; findings below may reference images not displayed]

FINDINGS: No evidence of fracture, dislocation, or joint effusion. There are 3
compartment osteoarthritic changes with joint space narrowing,
subchondral sclerosis, osteophyte formation. Meniscal calcifications
also seen. Soft tissues are unremarkable.
IMPRESSION: No acute fracture or dislocation identified about the right Knee.

Three compartment osteoarthritic changes, moderate to severe.

## 2017-04-26 NOTE — Telephone Encounter (Signed)
Patient called and asked about her upcoming surgery, she has not heard anything yet.   She asks that someone call her.  She also asks if she can take the socks off to bathe, I told her yes, she can, and then put them back on.  She says her legs are burning, and she nor her sister can get them back on once they are off.  I did not advise further.  Can you please check on her surgery?  Thanks.

## 2017-04-26 NOTE — Telephone Encounter (Signed)
Taren, please call patient to schedule.  We could put her on for this Friday 04/28/17.

## 2017-04-27 ENCOUNTER — Other Ambulatory Visit (INDEPENDENT_AMBULATORY_CARE_PROVIDER_SITE_OTHER): Payer: Self-pay | Admitting: Family

## 2017-04-27 ENCOUNTER — Other Ambulatory Visit (HOSPITAL_COMMUNITY): Payer: Self-pay

## 2017-04-27 ENCOUNTER — Telehealth (INDEPENDENT_AMBULATORY_CARE_PROVIDER_SITE_OTHER): Payer: Self-pay | Admitting: Orthopedic Surgery

## 2017-04-27 ENCOUNTER — Encounter (HOSPITAL_COMMUNITY): Payer: Self-pay | Admitting: *Deleted

## 2017-04-27 NOTE — Telephone Encounter (Signed)
While scheduling surgery with pt she stated the ted hose on her leg is cutting off her circulation and that where she went to get it they told her they do not have a bigger size. I advised pt to take ted hose off until Dr. Sharol Given advises of what to do from there. Pt also wants to know what medicines she needs to stop taking before her surgery which is scheduled for tomorrow. Please give pt a call.

## 2017-04-27 NOTE — Telephone Encounter (Signed)
I called and left voicemail to advise ok to discontinue TED hose and they will wrap patient after surgery. She should not have to discontinue any medications for this type of procedure. If she does the hospital will call her to notify her.

## 2017-04-27 NOTE — Progress Notes (Signed)
Paramedicine Encounter    Patient ID: [name], female    DOB: [birth year], 56 y.o.   MRN: [id number]   Patient Care Team: Sandi Mariscal, MD as PCP - General (Internal Medicine) [name], MD as Consulting Physician (Cardiology) [name], MD as Consulting Physician (Pulmonary Disease)  Patient Active Problem List   Diagnosis Date Noted  . PAD (peripheral artery disease) ([place])   . Gangrene of toe of right foot ([place])   . Severe sepsis ([place]) 04/06/2017  . CKD (chronic kidney disease), stage III 11/24/2016  . Suspected sleep apnea 11/24/2016  . Severe obesity (BMI >= 40) ([place]) 02/24/2016  . COPD GOLD 0  02/24/2016  . Chronic systolic CHF (congestive heart failure) ([place]) 02/12/2016  . Encounter for therapeutic drug monitoring 02/10/2016  . Symptomatic bradycardia 01/12/2016  . Essential hypertension 12/22/2015  . Wheeze   . 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 Tobacco abuse 10/23/2015  . Coronary artery disease involving native coronary artery of native heart   . DOE (dyspnea on exertion) 04/29/2015  . PAF (paroxysmal atrial fibrillation) ([place]) 01/16/2015  . Carotid arterial disease ([place]) 01/16/2015  . Claudication ([place]) 01/15/2015  . Demand ischemia ([place]) 10/29/2014  . Insomnia 02/03/2014  . S/P peripheral artery angioplasty - TurboHawk atherectomy; R SFA 09/11/2013    Class: Acute  . Leg pain, bilateral 08/19/2013  . Hypothyroidism 07/31/2013  . Cellulitis 06/13/2013  . History of cocaine abuse 06/13/2013  . Long term current use of anticoagulant therapy 05/20/2013  . Alcohol abuse   . Narcotic abuse   . Marijuana abuse   . Alcoholic cirrhosis ([place])   . DM (diabetes mellitus), type 2 with peripheral vascular complications ([name])     Current Outpatient Prescriptions:  .  albuterol (PROVENTIL HFA;VENTOLIN HFA) 108 (90 Base) MCG/ACT inhaler, Inhale 2 puffs into the lungs every 6 (six) hours as needed for wheezing or shortness of breath., Disp: 1 each, Rfl: 3 .   albuterol (PROVENTIL) (2.5 MG/3ML) 0.083% nebulizer solution, Take 3 mLs (2.5 mg total) by nebulization every 6 (six) hours as needed. For wheezing., Disp: 75 mL, Rfl: 2 .  amiodarone (PACERONE) 100 MG tablet, Take 1 tablet (100 mg total) by mouth daily., Disp: 30 tablet, Rfl: 3 .  amLODipine (NORVASC) 10 MG tablet, Take 1 tablet (10 mg total) by mouth daily., Disp: 30 tablet, Rfl: 3 .  apixaban (ELIQUIS) 5 MG TABS tablet, Take 1 tablet (5 mg total) by mouth 2 (two) times daily., Disp: 60 tablet, Rfl: 0 .  atorvastatin (LIPITOR) 40 MG tablet, TAKE 1 TABLET BY MOUTH ONCE EVERY EVENING, Disp: 90 tablet, Rfl: 3 .  bisoprolol (ZEBETA) 5 MG tablet, TAKE 1/2 TABLET BY MOUTH EVERY DAY, Disp: 15 tablet, Rfl: 2 .  clopidogrel (PLAVIX) 75 MG tablet, Take 1 tablet (75 mg total) by mouth daily with breakfast., Disp: 30 tablet, Rfl: 0 .  cyanocobalamin 1000 MCG tablet, Take 1 tablet (1,000 mcg total) by mouth daily., Disp: 30 tablet, Rfl: 10 .  doxycycline (VIBRA-TABS) 100 MG tablet, Take 1 tablet (100 mg total) by mouth 2 (two) times daily., Disp: 28 tablet, Rfl: 0 .  folic acid (FOLVITE) 1 MG tablet, TAKE 1 TABLET BY MOUTH EVERY DAY, Disp: 90 tablet, Rfl: 1 .  insulin aspart (NOVOLOG FLEXPEN) 100 UNIT/ML FlexPen, Inject 25 Units into the skin 3 (three) times daily with meals., Disp: 15 mL, Rfl: 11 .  Insulin Glargine (LANTUS SOLOSTAR) 100 UNIT/ML Solostar Pen, Inject 48 Units into the  skin daily at 10 pm., Disp: 15 mL, Rfl: 11 .  levothyroxine (SYNTHROID, LEVOTHROID) 50 MCG tablet, Take 1 tablet (50 mcg total) by mouth daily., Disp: 30 tablet, Rfl: 2 .  losartan (COZAAR) 25 MG tablet, Take 1 tablet (25 mg total) by mouth daily., Disp: 30 tablet, Rfl: 3 .  metolazone (ZAROXOLYN) 2.5 MG tablet, TAKE 1 TABLET BY MOUTH ONCE A WEEK ON THURSDAY. MAY TAKE AN ADDITIONAL TABLET AS DIRECTED BY HF CLINIC., Disp: 5 tablet, Rfl: 4 .  Multiple Vitamin (MULTIVITAMIN) tablet, Take 1 tablet by mouth daily., Disp: , Rfl:  .   potassium chloride SA (K-DUR,KLOR-CON) 20 MEQ tablet, Take 1 tablet (20 mEq total) by mouth daily. Take an additional 20 meq (1 tab) on Thursday with metolazone, Disp: 35 tablet, Rfl: 3 .  spironolactone (ALDACTONE) 25 MG tablet, Take 1 tablet (25 mg total) by mouth daily., Disp: 90 tablet, Rfl: 3 .  torsemide (DEMADEX) 20 MG tablet, Take 2 tablets (40 mg total) by mouth 2 (two) times daily., Disp: 360 tablet, Rfl: 3 .  acetaminophen-codeine (TYLENOL #3) 300-30 MG tablet, Take 1 tablet by mouth 2 (two) times daily as needed for pain., Disp: , Rfl: 0 .  Amino Acids-Protein Hydrolys (FEEDING SUPPLEMENT, PRO-STAT SUGAR FREE 64,) LIQD, Take 30 mLs by mouth 2 (two) times daily. (Patient not taking: Reported on 04/27/2017), Disp: 60 Bottle, Rfl: 0 .  amoxicillin-clavulanate (AUGMENTIN) 875-125 MG tablet, Take 1 tablet by mouth 2 (two) times daily. (Patient not taking: Reported on 04/20/2017), Disp: 10 tablet, Rfl: 0 .  cholecalciferol (VITAMIN D) 1000 units tablet, Take 1,000 Units by mouth daily., Disp: , Rfl:  .  gabapentin (NEURONTIN) 300 MG capsule, Take 300 mg by mouth 2 (two) times daily. , Disp: , Rfl:  .  oxyCODONE-acetaminophen (PERCOCET/ROXICET) 5-325 MG tablet, Take 2 tablets by mouth every 4 (four) hours as needed for severe pain. (Patient not taking: Reported on 04/20/2017), Disp: 15 tablet, Rfl: 0 Allergies  Allergen Reactions  . Gabapentin Nausea And Vomiting    POSSIBLE SHAKING? TAKING MED CURRENTLY     Social History   Social History  . Marital status: Single    Spouse name: N/A  . Number of children: N/A  . Years of education: N/A   Occupational History  . disabled    Social History Main Topics  . Smoking status: Current Some Day Smoker    Packs/day: 0.00    Years: 40.00    Types: E-cigarettes    Last attempt to quit: 11/03/2015  . Smokeless tobacco: Former Systems developer    Types: Snuff  . Alcohol use No     Comment: former alcohol abuse  . Drug use: No     Comment: 04/29/2015  "last drug use was ~ 09/08/2013"  . Sexual activity: No   Other Topics Concern  . Not on file   Social History Narrative   Lives in [place], in [place] with sister.  They are looking to move but don't have a place to go yet.      Physical Exam      Future Appointments Date Time Provider [place]  04/28/2017 12:30 PM [facility]  05/12/2017 10:00 AM Newt Minion, MD [facility] None  05/16/2017 11:15 AM [name], MD [facility]   BP 128/64   Pulse 78   Resp 16   Wt 258 lb (117 kg)   LMP 11/27/2012   SpO2 98%   BMI 45.70 kg/m  Weight  yesterday-259 Last visit weight-256 CBG PTA-333 [facility] EMS-360  Pt reports she is waiting a phone call to see when they are going to sch her surgery for her toe. She is in a lot of pain in her legs. She was prescribed compression stockings and they were so tight around her leg it was bruised right below and around her knees so she rolled them down. I advised her to elevate legs when she is sitting around-she reports it so too painful. Her legs are very swollen-she goes tomor to see doc about her legs again.  Pharmacy is bringing out metolazone-she will place it in her pill box for next week.  meds verified and pill box refilled. She started a new type pain med that is SL belbuca-which she states isnt helping.  She does not have home health nurse yet-they were going to wait til after her surgery to send them out. The social worker came out yesterday and I tried to contact her in regards to see if there was a way to get them out now since she is having so much pain and issues with the compression stockings-I had to leave a message. Her name is michelle with [facility]--[phone].  The orthopedics doc office called while I was still here and they advised for pt to remove her compression stockings and also to sch her surgery tomor at 3 and she is to arrive at 1pm. The hosp will be calling her for further instructions for  her medications the day of her surgery. Pt is fully aware of all this. Will f/u next week.    ACTION: Home visit completed  Marylouise Stacks, EMT-Paramedic 04/27/17

## 2017-04-27 NOTE — Progress Notes (Signed)
I spoke to [name] and her daughter.  [name] reports that CBG runs >220, up to 300's.  I Instructed her to take 1/2 of Lantus tonight and check CBG when she gets up and every 2 hours until she arrives at the hospital. I instructed patient if CBG is greater than 70 to take 1/2 of SS Insulin x 1. I instructed patient that if CBG< 70 to drink 1/2 cup of clear juice such as apple or cranberry, recheck CBG in 15 minutes and call pre op desk. Patient's daughter took notes and read them back to me correctly.

## 2017-04-28 ENCOUNTER — Encounter (HOSPITAL_COMMUNITY): Payer: Self-pay | Admitting: *Deleted

## 2017-04-28 ENCOUNTER — Ambulatory Visit (HOSPITAL_COMMUNITY)
Admission: RE | Admit: 2017-04-28 | Discharge: 2017-04-28 | Disposition: A | Discharge status: Home / Self Care | Payer: Medicare Other | Source: Ambulatory Visit | Attending: Orthopedic Surgery | Admitting: Orthopedic Surgery

## 2017-04-28 ENCOUNTER — Ambulatory Visit (HOSPITAL_COMMUNITY): Payer: Medicare Other | Admitting: Certified Registered Nurse Anesthetist

## 2017-04-28 ENCOUNTER — Other Ambulatory Visit (HOSPITAL_COMMUNITY): Payer: Self-pay | Admitting: Student

## 2017-04-28 ENCOUNTER — Encounter (HOSPITAL_COMMUNITY)
Admission: RE | Disposition: A | Discharge status: Home / Self Care | Payer: Self-pay | Source: Ambulatory Visit | Attending: Orthopedic Surgery

## 2017-04-28 ENCOUNTER — Other Ambulatory Visit: Payer: Self-pay | Admitting: Cardiology

## 2017-04-28 ENCOUNTER — Inpatient Hospital Stay (HOSPITAL_COMMUNITY)
Admission: RE | Admit: 2017-04-28 | Payer: Medicare Other | Source: Ambulatory Visit | Attending: Cardiology | Admitting: Cardiology

## 2017-04-28 DIAGNOSIS — Z888 Allergy status to other drugs, medicaments and biological substances status: Secondary | ICD-10-CM | POA: Insufficient documentation

## 2017-04-28 DIAGNOSIS — K7030 Alcoholic cirrhosis of liver without ascites: Secondary | ICD-10-CM | POA: Diagnosis not present

## 2017-04-28 DIAGNOSIS — M869 Osteomyelitis, unspecified: Secondary | ICD-10-CM | POA: Insufficient documentation

## 2017-04-28 DIAGNOSIS — Z79899 Other long term (current) drug therapy: Secondary | ICD-10-CM | POA: Insufficient documentation

## 2017-04-28 DIAGNOSIS — J449 Chronic obstructive pulmonary disease, unspecified: Secondary | ICD-10-CM | POA: Insufficient documentation

## 2017-04-28 DIAGNOSIS — K219 Gastro-esophageal reflux disease without esophagitis: Secondary | ICD-10-CM | POA: Insufficient documentation

## 2017-04-28 DIAGNOSIS — Z833 Family history of diabetes mellitus: Secondary | ICD-10-CM | POA: Insufficient documentation

## 2017-04-28 DIAGNOSIS — Z803 Family history of malignant neoplasm of breast: Secondary | ICD-10-CM | POA: Insufficient documentation

## 2017-04-28 DIAGNOSIS — I429 Cardiomyopathy, unspecified: Secondary | ICD-10-CM | POA: Insufficient documentation

## 2017-04-28 DIAGNOSIS — E039 Hypothyroidism, unspecified: Secondary | ICD-10-CM | POA: Diagnosis not present

## 2017-04-28 DIAGNOSIS — I480 Paroxysmal atrial fibrillation: Secondary | ICD-10-CM | POA: Insufficient documentation

## 2017-04-28 DIAGNOSIS — Z8249 Family history of ischemic heart disease and other diseases of the circulatory system: Secondary | ICD-10-CM | POA: Insufficient documentation

## 2017-04-28 DIAGNOSIS — E538 Deficiency of other specified B group vitamins: Secondary | ICD-10-CM | POA: Insufficient documentation

## 2017-04-28 DIAGNOSIS — F419 Anxiety disorder, unspecified: Secondary | ICD-10-CM | POA: Insufficient documentation

## 2017-04-28 DIAGNOSIS — E785 Hyperlipidemia, unspecified: Secondary | ICD-10-CM | POA: Diagnosis not present

## 2017-04-28 DIAGNOSIS — R001 Bradycardia, unspecified: Secondary | ICD-10-CM | POA: Diagnosis not present

## 2017-04-28 DIAGNOSIS — E1169 Type 2 diabetes mellitus with other specified complication: Secondary | ICD-10-CM | POA: Insufficient documentation

## 2017-04-28 DIAGNOSIS — I2510 Atherosclerotic heart disease of native coronary artery without angina pectoris: Secondary | ICD-10-CM | POA: Diagnosis not present

## 2017-04-28 DIAGNOSIS — Z6841 Body Mass Index (BMI) 40.0 and over, adult: Secondary | ICD-10-CM | POA: Insufficient documentation

## 2017-04-28 DIAGNOSIS — I471 Supraventricular tachycardia: Secondary | ICD-10-CM | POA: Insufficient documentation

## 2017-04-28 DIAGNOSIS — E1151 Type 2 diabetes mellitus with diabetic peripheral angiopathy without gangrene: Secondary | ICD-10-CM

## 2017-04-28 DIAGNOSIS — I110 Hypertensive heart disease with heart failure: Secondary | ICD-10-CM | POA: Insufficient documentation

## 2017-04-28 DIAGNOSIS — E1142 Type 2 diabetes mellitus with diabetic polyneuropathy: Secondary | ICD-10-CM | POA: Diagnosis not present

## 2017-04-28 DIAGNOSIS — Z87891 Personal history of nicotine dependence: Secondary | ICD-10-CM | POA: Diagnosis not present

## 2017-04-28 DIAGNOSIS — E1152 Type 2 diabetes mellitus with diabetic peripheral angiopathy with gangrene: Secondary | ICD-10-CM | POA: Insufficient documentation

## 2017-04-28 DIAGNOSIS — I96 Gangrene, not elsewhere classified: Secondary | ICD-10-CM | POA: Diagnosis not present

## 2017-04-28 DIAGNOSIS — I5042 Chronic combined systolic (congestive) and diastolic (congestive) heart failure: Secondary | ICD-10-CM | POA: Diagnosis not present

## 2017-04-28 DIAGNOSIS — Z800 Family history of malignant neoplasm of digestive organs: Secondary | ICD-10-CM | POA: Insufficient documentation

## 2017-04-28 DIAGNOSIS — Z8041 Family history of malignant neoplasm of ovary: Secondary | ICD-10-CM | POA: Insufficient documentation

## 2017-04-28 DIAGNOSIS — Z825 Family history of asthma and other chronic lower respiratory diseases: Secondary | ICD-10-CM | POA: Insufficient documentation

## 2017-04-28 DIAGNOSIS — Z7901 Long term (current) use of anticoagulants: Secondary | ICD-10-CM | POA: Insufficient documentation

## 2017-04-28 DIAGNOSIS — Z794 Long term (current) use of insulin: Secondary | ICD-10-CM | POA: Insufficient documentation

## 2017-04-28 DIAGNOSIS — I5022 Chronic systolic (congestive) heart failure: Secondary | ICD-10-CM

## 2017-04-28 HISTORY — PX: AMPUTATION TOE: SHX6595

## 2017-04-28 LAB — COMPREHENSIVE METABOLIC PANEL
ALT: 27 U/L (ref 14–54)
ANION GAP: 13 (ref 5–15)
AST: 23 U/L (ref 15–41)
Albumin: 3.1 g/dL — ABNORMAL LOW (ref 3.5–5.0)
Alkaline Phosphatase: 127 U/L — ABNORMAL HIGH (ref 38–126)
BUN: 52 mg/dL — ABNORMAL HIGH (ref 6–20)
CHLORIDE: 98 mmol/L — AB (ref 101–111)
CO2: 24 mmol/L (ref 22–32)
CREATININE: 1.57 mg/dL — AB (ref 0.44–1.00)
Calcium: 9.3 mg/dL (ref 8.9–10.3)
GFR calc non Af Amer: 36 mL/min — ABNORMAL LOW (ref >60)
GFR, EST AFRICAN AMERICAN: 42 mL/min — AB (ref >60)
Glucose, Bld: 88 mg/dL (ref 65–99)
Potassium: 3.7 mmol/L (ref 3.5–5.1)
SODIUM: 135 mmol/L (ref 135–145)
Total Bilirubin: 0.4 mg/dL (ref 0.3–1.2)
Total Protein: 7.9 g/dL (ref 6.5–8.1)

## 2017-04-28 LAB — CBC
HEMATOCRIT: 30.7 % — AB (ref 36.0–46.0)
HEMOGLOBIN: 9.9 g/dL — AB (ref 12.0–15.0)
MCH: 29.6 pg (ref 26.0–34.0)
MCHC: 32.2 g/dL (ref 30.0–36.0)
MCV: 91.9 fL (ref 78.0–100.0)
Platelets: 357 10*3/uL (ref 150–400)
RBC: 3.34 MIL/uL — AB (ref 3.87–5.11)
RDW: 14.8 % (ref 11.5–15.5)
WBC: 15.2 10*3/uL — ABNORMAL HIGH (ref 4.0–10.5)

## 2017-04-28 LAB — PROTIME-INR
INR: 1.21
Prothrombin Time: 15.4 seconds — ABNORMAL HIGH (ref 11.4–15.2)

## 2017-04-28 LAB — GLUCOSE, CAPILLARY
GLUCOSE-CAPILLARY: 103 mg/dL — AB (ref 65–99)
Glucose-Capillary: 105 mg/dL — ABNORMAL HIGH (ref 65–99)

## 2017-04-28 SURGERY — AMPUTATION, TOE
Anesthesia: General | Site: Toe | Laterality: Right

## 2017-04-28 MED ORDER — LIDOCAINE 2% (20 MG/ML) 5 ML SYRINGE
INTRAMUSCULAR | Status: AC
  Filled 2017-04-28: qty 5

## 2017-04-28 MED ORDER — CEFAZOLIN SODIUM-DEXTROSE 2-4 GM/100ML-% IV SOLN
2.0000 g | INTRAVENOUS | Status: AC
  Administered 2017-04-28: 2 g via INTRAVENOUS

## 2017-04-28 MED ORDER — HYDROMORPHONE HCL 1 MG/ML IJ SOLN
0.2500 mg | INTRAMUSCULAR | Status: DC | PRN
  Administered 2017-04-28 (×4): 0.5 mg via INTRAVENOUS

## 2017-04-28 MED ORDER — OXYCODONE-ACETAMINOPHEN 5-325 MG PO TABS
1.0000 | ORAL_TABLET | Freq: Once | ORAL | Status: AC
  Administered 2017-04-28: 1 via ORAL

## 2017-04-28 MED ORDER — FENTANYL CITRATE (PF) 100 MCG/2ML IJ SOLN
INTRAMUSCULAR | Status: DC | PRN
  Administered 2017-04-28 (×2): 50 ug via INTRAVENOUS
  Administered 2017-04-28: 150 ug via INTRAVENOUS

## 2017-04-28 MED ORDER — OXYCODONE-ACETAMINOPHEN 5-325 MG PO TABS: 1.0000 | ORAL_TABLET | ORAL | 0 refills | Status: DC | PRN

## 2017-04-28 MED ORDER — CHLORHEXIDINE GLUCONATE 4 % EX LIQD: 60.0000 mL | Freq: Once | CUTANEOUS | Status: DC

## 2017-04-28 MED ORDER — PROPOFOL 10 MG/ML IV BOLUS
INTRAVENOUS | Status: DC | PRN
  Administered 2017-04-28: 100 mg via INTRAVENOUS

## 2017-04-28 MED ORDER — HYDROMORPHONE HCL 1 MG/ML IJ SOLN
INTRAMUSCULAR | Status: DC
Start: 2017-04-28 — End: 2017-04-28
  Filled 2017-04-28: qty 0.5

## 2017-04-28 MED ORDER — MIDAZOLAM HCL 5 MG/5ML IJ SOLN
INTRAMUSCULAR | Status: DC | PRN
  Administered 2017-04-28: 2 mg via INTRAVENOUS

## 2017-04-28 MED ORDER — 0.9 % SODIUM CHLORIDE (POUR BTL) OPTIME
TOPICAL | Status: DC | PRN
  Administered 2017-04-28: 1000 mL

## 2017-04-28 MED ORDER — FENTANYL CITRATE (PF) 250 MCG/5ML IJ SOLN
INTRAMUSCULAR | Status: AC
  Filled 2017-04-28: qty 5

## 2017-04-28 MED ORDER — OXYCODONE-ACETAMINOPHEN 5-325 MG PO TABS
ORAL_TABLET | ORAL | Status: AC
  Filled 2017-04-28: qty 1

## 2017-04-28 MED ORDER — PROPOFOL 10 MG/ML IV BOLUS
INTRAVENOUS | Status: AC
  Filled 2017-04-28: qty 20

## 2017-04-28 MED ORDER — LIDOCAINE HCL (CARDIAC) 20 MG/ML IV SOLN
INTRAVENOUS | Status: DC | PRN
  Administered 2017-04-28: 50 mg via INTRAVENOUS

## 2017-04-28 MED ORDER — SODIUM CHLORIDE 0.9 % IV SOLN
INTRAVENOUS | Status: DC
  Administered 2017-04-28 (×2): via INTRAVENOUS

## 2017-04-28 MED ORDER — ONDANSETRON HCL 4 MG/2ML IJ SOLN
INTRAMUSCULAR | Status: AC
  Filled 2017-04-28: qty 2

## 2017-04-28 MED ORDER — MIDAZOLAM HCL 2 MG/2ML IJ SOLN
INTRAMUSCULAR | Status: AC
  Filled 2017-04-28: qty 2

## 2017-04-28 MED ORDER — ONDANSETRON HCL 4 MG/2ML IJ SOLN
INTRAMUSCULAR | Status: DC | PRN
  Administered 2017-04-28: 4 mg via INTRAVENOUS

## 2017-04-28 MED ORDER — HYDROMORPHONE HCL 1 MG/ML IJ SOLN
INTRAMUSCULAR | Status: AC
  Filled 2017-04-28: qty 0.5

## 2017-04-28 SURGICAL SUPPLY — 27 items
BLADE SAW SGTL MED 73X18.5 STR (BLADE) IMPLANT
BLADE SURG 21 STRL SS (BLADE) ×3 IMPLANT
BNDG COHESIVE 4X5 TAN STRL (GAUZE/BANDAGES/DRESSINGS) ×3 IMPLANT
BNDG GAUZE ELAST 4 BULKY (GAUZE/BANDAGES/DRESSINGS) ×3 IMPLANT
COVER SURGICAL LIGHT HANDLE (MISCELLANEOUS) ×6 IMPLANT
DRAPE U-SHAPE 47X51 STRL (DRAPES) ×6 IMPLANT
DRSG ADAPTIC 3X8 NADH LF (GAUZE/BANDAGES/DRESSINGS) ×3 IMPLANT
DRSG EMULSION OIL 3X3 NADH (GAUZE/BANDAGES/DRESSINGS) ×3 IMPLANT
DRSG PAD ABDOMINAL 8X10 ST (GAUZE/BANDAGES/DRESSINGS) ×3 IMPLANT
DURAPREP 26ML APPLICATOR (WOUND CARE) ×3 IMPLANT
ELECT REM PT RETURN 9FT ADLT (ELECTROSURGICAL) ×3
ELECTRODE REM PT RTRN 9FT ADLT (ELECTROSURGICAL) ×1 IMPLANT
GAUZE SPONGE 4X4 12PLY STRL (GAUZE/BANDAGES/DRESSINGS) ×3 IMPLANT
GAUZE SPONGE 4X4 12PLY STRL LF (GAUZE/BANDAGES/DRESSINGS) ×3 IMPLANT
GLOVE BIOGEL PI IND STRL 9 (GLOVE) ×1 IMPLANT
GLOVE BIOGEL PI INDICATOR 9 (GLOVE) ×2
GLOVE SURG ORTHO 9.0 STRL STRW (GLOVE) ×3 IMPLANT
GOWN STRL REUS W/ TWL XL LVL3 (GOWN DISPOSABLE) ×2 IMPLANT
GOWN STRL REUS W/TWL XL LVL3 (GOWN DISPOSABLE) ×4
KIT BASIN OR (CUSTOM PROCEDURE TRAY) ×3 IMPLANT
KIT ROOM TURNOVER OR (KITS) ×3 IMPLANT
NS IRRIG 1000ML POUR BTL (IV SOLUTION) ×3 IMPLANT
PACK ORTHO EXTREMITY (CUSTOM PROCEDURE TRAY) ×3 IMPLANT
PAD ARMBOARD 7.5X6 YLW CONV (MISCELLANEOUS) ×6 IMPLANT
STOCKINETTE IMPERVIOUS LG (DRAPES) IMPLANT
SUT ETHILON 2 0 PSLX (SUTURE) ×3 IMPLANT
TOWEL OR 17X26 10 PK STRL BLUE (TOWEL DISPOSABLE) ×3 IMPLANT

## 2017-04-28 NOTE — H&P (Signed)
[name] is an 56 y.o. female.   Chief Complaint: Osteomyelitis ulceration gangrene right foot second toe HPI: Patient is a 56 year old woman diabetic and sensory neuropathy peripheral vascular disease who has failed conservative treatment for salvage of the second toe  Past Medical History:  Diagnosis Date  . Alcohol abuse   . Alcoholic cirrhosis ([place])   . Alcoholic cirrhosis ([place])   . Anemia   . Anxiety   . B12 deficiency   . CAD (coronary artery disease)    a. cath 11/10/2014 LM nl, LAD min irregs, D1 30 ost, D2 50d, LCX 73m OM1 80 p/m (1.5 mm vessel), OM2 442mRCA nondom 9040mmed rx.. Demand ischemia in the setting of rapid a-fib.  . Cardiomyopathy (HCCRog[place] . Carotid artery disease (HCCCamb[place]  a. 01/2015 Carotid Angio: RICA [id number]/p L carotid endarterectomy 02/2015.  . CMarland Kitchenronic combined systolic and diastolic CHF (congestive heart failure) (HCCPrinceton  a. Last echo 12/205: EF 40-45%, inferoapical/posterior HK, not technically sufficient to allow eval of LV diastolic function, normal LA in size..  . Cocaine abuse   . COPD (chronic obstructive pulmonary disease) (HCCMarble Falls . Critical lower limb ischemia   . Diabetic peripheral neuropathy (HCC[place]ear [place] . Elevated troponin    a. Chronic elevation.  . GMarland KitchenRD (gastroesophageal reflux disease)   . Hyperlipemia   . Hypertension   . Hypokalemia   . Hypomagnesemia   . Hypothyroidism   . Marijuana abuse   . Narcotic abuse   . Noncompliance   . NSVT (nonsustained ventricular tachycardia) (HCCRay[place] . Obesity   . PAF (paroxysmal atrial fibrillation) (HCCAm[place] . Paroxysmal atrial tachycardia (HCCGlenview [place] . Paroxysmal atrial tachycardia (HCCDolores . Peripheral arterial disease (HCCPastos  a. 01/2015 Angio/PTA: LSFA 100 w/ recon @ adductor canal and 1 vessel runoff via AT, RSFA 99 (atherectomy/pta) - 1 vessel runoff via diff dzs peroneal.  . Poorly controlled type 2 diabetes mellitus (HCCGeorgetown . Renal insufficiency    a. 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  . SMarland Kitchenmptomatic  bradycardia    avoid AV blocking agent per EP  . Tobacco abuse     Past Surgical History:  Procedure Laterality Date  . BALLOON ANGIOPLASTY, ARTERY Right 01/15/2015   SFA/notes 01/15/2015  . CARDIAC CATHETERIZATION    . CARDIAC CATHETERIZATION N/A 01/12/2016   Procedure: Temporary Wire;  Surgeon: JamMinus BreedingD;  [facility];  Service: Cardiovascular;  Laterality: N/A;  . CARDIOVERSION  ~ 02/2013   "twice"   . CAROTID ANGIOGRAM N/A 01/15/2015   Procedure: CAROTID ANGIOGRAM;  Surgeon: JonLorretta HarpD;  [facility];  Service: Cardiovascular;  Laterality: N/A;  . DILATION AND CURETTAGE OF UTERUS  1988  . ENDARTERECTOMY Left 02/19/2015   Procedure: LEFT CAROTID ENDARTERECTOMY ;  Surgeon: VanSerafina MitchellD;  [facility];  Laterality: Left;  . LEFT HEART CATHETERIZATION WITH CORONARY ANGIOGRAM N/A 10/31/2014   Procedure: LEFT HEART CATHETERIZATION WITH CORONARY ANGIOGRAM;  Surgeon: ChrBurnell BlanksD;  [facility];  Service: Cardiovascular;  Laterality: N/A;  . LOWER EXTREMITY ANGIOGRAM N/A 09/10/2013   Procedure: LOWER EXTREMITY ANGIOGRAM;  Surgeon: JonLorretta HarpD;  [facility];  Service: Cardiovascular;  Laterality: N/A;  . LOWER EXTREMITY ANGIOGRAM N/A 01/15/2015   Procedure: LOWER EXTREMITY ANGIOGRAM;  Surgeon: JonLorretta HarpD;  [facility];  Service: Cardiovascular;  Laterality: N/A;  . LOWER EXTREMITY ANGIOGRAPHY N/A 04/13/2017   Procedure: Lower Extremity Angiography;  Surgeon: [name], MD;  Location: [place] [facility] LAB;  Service: Cardiovascular;  Laterality: N/A;  . PERIPHERAL ATHRECTOMY Right 01/15/2015   SFA/notes 01/15/2015  . PERIPHERAL VASCULAR INTERVENTION  04/13/2017   Procedure: Peripheral Vascular Intervention;  Surgeon: [name], MD;  Location: [place] [facility] LAB;  Service: Cardiovascular;;    Family History  Problem Relation Age of Onset  . Hypertension  Mother   . Diabetes Mother   . Cancer Mother        breast, ovarian, colon  . Clotting disorder Mother   . Heart disease Mother   . Heart attack Mother   . Breast cancer Mother   . Hypertension Father   . Heart disease Father   . Emphysema Sister        smoked   Social History:  reports that she quit smoking about 3 weeks ago. Her smoking use included E-cigarettes. She smoked 0.00 packs per day for 40.00 years. She has quit using smokeless tobacco. Her smokeless tobacco use included Snuff. She reports that she does not drink alcohol or use drugs.  Allergies:  Allergies  Allergen Reactions  . Gabapentin Nausea And Vomiting    POSSIBLE SHAKING? TAKING MED CURRENTLY    Medications Prior to Admission  Medication Sig Dispense Refill  . albuterol (PROVENTIL HFA;VENTOLIN HFA) 108 (90 Base) MCG/ACT inhaler Inhale 2 puffs into the lungs every 6 (six) hours as needed for wheezing or shortness of breath. 1 each 3  . amiodarone (PACERONE) 100 MG tablet Take 1 tablet (100 mg total) by mouth daily. 30 tablet 3  . amLODipine (NORVASC) 10 MG tablet Take 1 tablet (10 mg total) by mouth daily. 30 tablet 3  . apixaban (ELIQUIS) 5 MG TABS tablet Take 1 tablet (5 mg total) by mouth 2 (two) times daily. 60 tablet 0  . atorvastatin (LIPITOR) 40 MG tablet TAKE 1 TABLET BY MOUTH ONCE EVERY EVENING 90 tablet 3  . BELBUCA 75 MCG FILM Place 1 Film inside cheek 2 (two) times daily.   0  . bisoprolol (ZEBETA) 5 MG tablet TAKE 1/2 TABLET BY MOUTH EVERY DAY 15 tablet 2  . cholecalciferol (VITAMIN D) 1000 units tablet Take 1,000 Units by mouth daily.    . clopidogrel (PLAVIX) 75 MG tablet Take 1 tablet (75 mg total) by mouth daily with breakfast. 30 tablet 0  . cyanocobalamin 1000 MCG tablet Take 1 tablet (1,000 mcg total) by mouth daily. 30 tablet 10  . folic acid (FOLVITE) 1 MG tablet TAKE 1 TABLET BY MOUTH EVERY DAY 90 tablet 1  . insulin aspart (NOVOLOG FLEXPEN) 100 UNIT/ML FlexPen Inject 25 Units into the  skin 3 (three) times daily with meals. 15 mL 11  . Insulin Glargine (LANTUS SOLOSTAR) 100 UNIT/ML Solostar Pen Inject 48 Units into the skin daily at 10 pm. 15 mL 11  . levothyroxine (SYNTHROID, LEVOTHROID) 50 MCG tablet Take 1 tablet (50 mcg total) by mouth daily. 30 tablet 2  . losartan (COZAAR) 25 MG tablet Take 1 tablet (25 mg total) by mouth daily. 30 tablet 3  . metolazone (ZAROXOLYN) 2.5 MG tablet TAKE 1 TABLET BY MOUTH ONCE A WEEK ON THURSDAY. MAY TAKE AN ADDITIONAL TABLET AS DIRECTED BY HF CLINIC. 5 tablet 4  . Multiple Vitamin (MULTIVITAMIN) tablet Take 1 tablet by mouth daily.    . potassium chloride SA (K-DUR,KLOR-CON) 20 MEQ tablet Take 1 tablet (  20 mEq total) by mouth daily. Take an additional 20 meq (1 tab) on Thursday with metolazone 35 tablet 3  . spironolactone (ALDACTONE) 25 MG tablet Take 1 tablet (25 mg total) by mouth daily. 90 tablet 3  . SYMBICORT 160-4.5 MCG/ACT inhaler Inhale 2 puffs into the lungs 2 (two) times daily.  8  . torsemide (DEMADEX) 20 MG tablet Take 2 tablets (40 mg total) by mouth 2 (two) times daily. 360 tablet 3    Results for orders placed or performed during the hospital encounter of 04/28/17 (from the past 48 hour(s))  Glucose, capillary     Status: Abnormal   Collection Time: 04/28/17  1:23 PM  Result Value Ref Range   Glucose-Capillary 105 (H) 65 - 99 mg/dL   Comment 1 Notify RN    Comment 2 Document in Chart   Protime-INR     Status: Abnormal   Collection Time: 04/28/17  1:24 PM  Result Value Ref Range   Prothrombin Time 15.4 (H) 11.4 - 15.2 seconds   INR 1.21   Comprehensive metabolic panel     Status: Abnormal   Collection Time: 04/28/17  1:24 PM  Result Value Ref Range   Sodium 135 135 - 145 mmol/L   Potassium 3.7 3.5 - 5.1 mmol/L   Chloride 98 (L) 101 - 111 mmol/L   CO2 24 22 - 32 mmol/L   Glucose, Bld 88 65 - 99 mg/dL   BUN 52 (H) 6 - 20 mg/dL   Creatinine, Ser 1.57 (H) 0.44 - 1.00 mg/dL   Calcium 9.3 8.9 - 10.3 mg/dL   Total  Protein 7.9 6.5 - 8.1 g/dL   Albumin 3.1 (L) 3.5 - 5.0 g/dL   AST 23 15 - 41 U/L   ALT 27 14 - 54 U/L   Alkaline Phosphatase 127 (H) 38 - 126 U/L   Total Bilirubin 0.4 0.3 - 1.2 mg/dL   GFR calc non Af Amer 36 (L) >60 mL/min   GFR calc Af Amer 42 (L) >60 mL/min    Comment: (NOTE) The eGFR has been calculated using the CKD EPI equation. This calculation has not been validated in all clinical situations. eGFR's persistently <60 mL/min signify possible Chronic Kidney Disease.    Anion gap 13 5 - 15  CBC     Status: Abnormal   Collection Time: 04/28/17  1:24 PM  Result Value Ref Range   WBC 15.2 (H) 4.0 - 10.5 K/uL   RBC 3.34 (L) 3.87 - 5.11 MIL/uL   Hemoglobin 9.9 (L) 12.0 - 15.0 g/dL   HCT 30.7 (L) 36.0 - 46.0 %   MCV 91.9 78.0 - 100.0 fL   MCH 29.6 26.0 - 34.0 pg   MCHC 32.2 30.0 - 36.0 g/dL   RDW 14.8 11.5 - 15.5 %   Platelets 357 150 - 400 K/uL  Glucose, capillary     Status: Abnormal   Collection Time: 04/28/17  2:27 PM  Result Value Ref Range   Glucose-Capillary 103 (H) 65 - 99 mg/dL   Comment 1 Notify RN    Comment 2 Document in Chart    No results found.  Review of Systems  All other systems reviewed and are negative.   Temperature 98 F (36.7 C), height _0  (1.6 m), weight 258 lb (117 kg), last menstrual period 11/27/2012. Physical Exam  On examination patient is alert oriented no adenopathy a well-dressed normal affect normal respiratory effort she does have an antalgic gait. Examination she has exposed  PIP joint with gangrenous skin changes distally. She does have a pulse. She has capillary refill proximally 3 seconds. There is no ascending cellulitis there is no abscess. Assessment/Plan Assessment: Diabetic and sensory neuropathy with peripheral vascular disease with osteomyelitis and gangrene right foot second toe.  Plan: We'll plan for right foot second ray amputation. Risks and benefits were discussed including risk of the wound not healing need for a  higher level amputation. Patient states she understands wished procedures this time.  Newt Minion, MD 04/28/2017, 2:29 PM

## 2017-04-28 NOTE — Op Note (Signed)
04/28/2017  2:08 PM  PATIENT:  [place] DIAGNOSIS:  GANGRENE OF RIGHT SECOND TOE, with osteomyelitis  POST-OPERATIVE DIAGNOSIS:  Same  PROCEDURE:  AMPUTATION OF RIGHT SECOND RAY  SURGEON:  Newt Minion, MD  PHYSICIAN ASSISTANT:None ANESTHESIA:   General  PREOPERATIVE INDICATIONS:  [name] is a  56 y.o. female with a diagnosis of GANGRENE OF RIGHT SECOND TOE who failed conservative measures and elected for surgical management.    The risks benefits and alternatives were discussed with the patient preoperatively including but not limited to the risks of infection, bleeding, nerve injury, cardiopulmonary complications, the need for revision surgery, among others, and the patient was willing to proceed.  OPERATIVE IMPLANTS: none  OPERATIVE FINDINGS: no abscess  OPERATIVE PROCEDURE: Patient was brought to the operating room and underwent a general anesthetic. After adequate levels anesthesia were obtained patient's right lower extremity was prepped using DuraPrep draped into a sterile field a timeout was called. A dorsal incision was made over the second ray. The second toe and metatarsal were resected in 1 block of tissue through the shaft of the second metatarsal. Electrocautery was used for hemostasis. The wound was irrigated with normal saline. The incision was closed using 2-0 nylon there was no tension on the skin there was no signs of abscess no necrotic tissue. There was good petechial bleeding. A sterile compressive dressing was applied patient was extubated taken to the PACU in stable condition plan for discharge to home.

## 2017-04-28 NOTE — Anesthesia Procedure Notes (Signed)
Procedure Name: LMA Insertion Date/Time: 04/28/2017 1:54 PM Performed by: Greggory Stallion, Briannie Gutierrez L Pre-anesthesia Checklist: Patient identified, Emergency Drugs available, Suction available and Patient being monitored Patient Re-evaluated:Patient Re-evaluated prior to inductionOxygen Delivery Method: Circle System Utilized Preoxygenation: Pre-oxygenation with 100% oxygen Intubation Type: IV induction Ventilation: Mask ventilation without difficulty LMA: LMA inserted LMA Size: 5.0 Number of attempts: 1 Airway Equipment and Method: Bite block Placement Confirmation: positive ETCO2 Tube secured with: Tape Dental Injury: Teeth and Oropharynx as per pre-operative assessment

## 2017-04-28 NOTE — Anesthesia Preprocedure Evaluation (Addendum)
Anesthesia Evaluation  Patient identified by MRN, date of birth, ID band Patient awake    Reviewed: Allergy & Precautions, H&P , NPO status , Patient's Chart, lab work & pertinent test results  Airway Mallampati: I  TM Distance: >3 FB Neck ROM: Full    Dental no notable dental hx. (+) Edentulous Upper, Edentulous Lower, Dental Advisory Given   Pulmonary COPD,  COPD inhaler, former smoker,    Pulmonary exam normal breath sounds clear to auscultation       Cardiovascular hypertension, Pt. on medications and On Home Beta Blockers + CAD, + Peripheral Vascular Disease and +CHF   Rhythm:Regular Rate:Normal     Neuro/Psych Anxiety negative neurological ROS  negative psych ROS   GI/Hepatic Neg liver ROS, GERD  ,  Endo/Other  diabetes, Insulin DependentHypothyroidism Morbid obesity  Renal/GU Renal InsufficiencyRenal disease  negative genitourinary   Musculoskeletal   Abdominal   Peds  Hematology negative hematology ROS (+) anemia ,   Anesthesia Other Findings   Reproductive/Obstetrics negative OB ROS                           Anesthesia Physical Anesthesia Plan  ASA: III  Anesthesia Plan: General   Post-op Pain Management:    Induction: Intravenous  PONV Risk Score and Plan: 4 or greater and Ondansetron, Dexamethasone, Propofol and Midazolam  Airway Management Planned: LMA  Additional Equipment:   Intra-op Plan:   Post-operative Plan: Extubation in OR  Informed Consent: I have reviewed the patients History and Physical, chart, labs and discussed the procedure including the risks, benefits and alternatives for the proposed anesthesia with the patient or authorized representative who has indicated his/her understanding and acceptance.   Dental advisory given  Plan Discussed with: CRNA  Anesthesia Plan Comments:        Anesthesia Quick Evaluation

## 2017-04-28 NOTE — Transfer of Care (Signed)
Immediate Anesthesia Transfer of Care Note  Patient: [name]  Procedure(s) Performed: Procedure(s): AMPUTATION OF RIGHT SECOND RAY (Right)  Patient Location: PACU  Anesthesia Type:General  Level of Consciousness: awake, alert , oriented and patient cooperative  Airway & Oxygen Therapy: Patient Spontanous Breathing  Post-op Assessment: Report given to RN, Post -op Vital signs reviewed and stable and Patient moving all extremities  Post vital signs: Reviewed and stable  Last Vitals:  Vitals:   04/28/17 1425  Temp: 36.7 C    Last Pain:  Vitals:   04/28/17 1325  PainSc: 8       Patients Stated Pain Goal: 3 (38/75/64 3329)  Complications: No apparent anesthesia complications

## 2017-05-01 ENCOUNTER — Encounter (HOSPITAL_COMMUNITY): Payer: Self-pay | Admitting: Orthopedic Surgery

## 2017-05-01 ENCOUNTER — Other Ambulatory Visit: Payer: Self-pay | Admitting: Cardiology

## 2017-05-01 NOTE — Anesthesia Postprocedure Evaluation (Signed)
Anesthesia Post Note  Patient: [name]  Procedure(s) Performed: Procedure(s) (LRB): AMPUTATION OF RIGHT SECOND RAY (Right)     Patient location during evaluation: PACU Anesthesia Type: General Level of consciousness: awake and alert Pain management: pain level controlled Vital Signs Assessment: post-procedure vital signs reviewed and stable Respiratory status: spontaneous breathing, nonlabored ventilation, respiratory function stable and patient connected to nasal cannula oxygen Cardiovascular status: blood pressure returned to baseline and stable Postop Assessment: no signs of nausea or vomiting Anesthetic complications: no    Last Vitals:  Vitals:   04/28/17 1545 04/28/17 1554  BP:  135/71  Pulse: 74 77  Resp: 12 18  Temp: 36.4 C     Last Pain:  Vitals:   04/28/17 1530  PainSc: 9                  Antinio Sanderfer

## 2017-05-04 ENCOUNTER — Other Ambulatory Visit (HOSPITAL_COMMUNITY): Payer: Self-pay

## 2017-05-04 ENCOUNTER — Telehealth (HOSPITAL_COMMUNITY): Payer: Self-pay | Admitting: Pharmacist

## 2017-05-04 NOTE — Telephone Encounter (Signed)
Dee with paramedicine was visiting patient's home today and noticed that she was prescribed doxycycline 100 mg BID x 7 days after her recent hospital visit but this was not on her list. Will update medication list accordingly.   [name]. [name], PharmD, BCPS, CPP Clinical Pharmacist Pager: [phone] Phone: [phone] 05/04/2017 4:22 PM

## 2017-05-04 NOTE — Progress Notes (Signed)
Paramedicine Encounter    Patient ID: [name], female    DOB: Aug 06, 1961, 56 y.o.   MRN: [id number]    Patient Care Team: Sandi Mariscal, MD as PCP - General (Internal Medicine) [name], MD as Consulting Physician (Cardiology) [name], MD as Consulting Physician (Pulmonary Disease)  Patient Active Problem List   Diagnosis Date Noted  . PAD (peripheral artery disease) ([place])   . Gangrene of toe of right foot ([place])   . Severe sepsis ([place]) 04/06/2017  . CKD (chronic kidney disease), stage III 11/24/2016  . Suspected sleep apnea 11/24/2016  . Severe obesity (BMI >= 40) ([place]) 02/24/2016  . COPD GOLD 0  02/24/2016  . Chronic systolic CHF (congestive heart failure) ([place]) 02/12/2016  . Encounter for therapeutic drug monitoring 02/10/2016  . Symptomatic bradycardia 01/12/2016  . Essential hypertension 12/22/2015  . Wheeze   . 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 Tobacco abuse 10/23/2015  . Coronary artery disease involving native coronary artery of native heart   . DOE (dyspnea on exertion) 04/29/2015  . PAF (paroxysmal atrial fibrillation) ([place]) 01/16/2015  . Carotid arterial disease ([place]) 01/16/2015  . Claudication ([place]) 01/15/2015  . Demand ischemia ([place]) 10/29/2014  . Insomnia 02/03/2014  . S/P peripheral artery angioplasty - [place]bo[place] atherectomy; R SFA 09/11/2013    Class: Acute  . Leg pain, bilateral 08/19/2013  . Hypothyroidism 07/31/2013  . Cellulitis 06/13/2013  . History of cocaine abuse 06/13/2013  . Long term current use of anticoagulant therapy 05/20/2013  . Alcohol abuse   . Narcotic abuse   . Marijuana abuse   . Alcoholic cirrhosis ([place])   . DM (diabetes mellitus), type 2 with peripheral vascular complications ([name])     Current Outpatient Prescriptions:  .  amiodarone (PACERONE) 100 MG tablet, Take 1 tablet (100 mg total) by mouth daily., Disp: 30 tablet, Rfl: 3 .  amLODipine (NORVASC) 10 MG tablet, TAKE ONE TABLET BY MOUTH ONCE DAILY, Disp:  30 tablet, Rfl: 3 .  apixaban (ELIQUIS) 5 MG TABS tablet, Take 1 tablet (5 mg total) by mouth 2 (two) times daily., Disp: 60 tablet, Rfl: 0 .  atorvastatin (LIPITOR) 40 MG tablet, TAKE 1 TABLET BY MOUTH ONCE EVERY EVENING, Disp: 90 tablet, Rfl: 3 .  bisoprolol (ZEBETA) 5 MG tablet, TAKE ONE-HALF ( 2.5MG  TOTAL) TABLET BY MOUTH DAILY, Disp: 15 tablet, Rfl: 3 .  clopidogrel (PLAVIX) 75 MG tablet, Take 1 tablet (75 mg total) by mouth daily with breakfast., Disp: 30 tablet, Rfl: 0 .  folic acid (FOLVITE) 1 MG tablet, TAKE 1 TABLET BY MOUTH EVERY DAY, Disp: 90 tablet, Rfl: 1 .  insulin aspart (NOVOLOG FLEXPEN) 100 UNIT/ML FlexPen, Inject 25 Units into the skin 3 (three) times daily with meals., Disp: 15 mL, Rfl: 11 .  Insulin Glargine (LANTUS SOLOSTAR) 100 UNIT/ML Solostar Pen, Inject 48 Units into the skin daily at 10 pm., Disp: 15 mL, Rfl: 11 .  levothyroxine (SYNTHROID, LEVOTHROID) 50 MCG tablet, Take 1 tablet (50 mcg total) by mouth daily., Disp: 30 tablet, Rfl: 2 .  losartan (COZAAR) 25 MG tablet, TAKE ONE TABLET BY MOUTH ONCE DAILY, Disp: 90 tablet, Rfl: 3 .  Multiple Vitamin (MULTIVITAMIN) tablet, Take 1 tablet by mouth daily., Disp: , Rfl:  .  potassium chloride SA (K-DUR,KLOR-CON) 20 MEQ tablet, Take 1 tablet (20 mEq total) by mouth daily. Take an additional 20 meq (1 tab) on Thursday with metolazone, Disp: 35 tablet, Rfl: 3 .  spironolactone (ALDACTONE) 25 MG  tablet, Take 1 tablet (25 mg total) by mouth daily., Disp: 90 tablet, Rfl: 3 .  torsemide (DEMADEX) 20 MG tablet, Take 2 tablets (40 mg total) by mouth 2 (two) times daily., Disp: 360 tablet, Rfl: 3 .  albuterol (PROVENTIL HFA;VENTOLIN HFA) 108 (90 Base) MCG/ACT inhaler, Inhale 2 puffs into the lungs every 6 (six) hours as needed for wheezing or shortness of breath., Disp: 1 each, Rfl: 3 .  BELBUCA 75 MCG FILM, Place 1 Film inside cheek 2 (two) times daily. , Disp: , Rfl: 0 .  cholecalciferol (VITAMIN D) 1000 units tablet, Take 1,000 Units  by mouth daily., Disp: , Rfl:  .  cyanocobalamin 1000 MCG tablet, Take 1 tablet (1,000 mcg total) by mouth daily., Disp: 30 tablet, Rfl: 10 .  metolazone (ZAROXOLYN) 2.5 MG tablet, TAKE 1 TABLET BY MOUTH ONCE A WEEK ON THURSDAY. MAY TAKE AN ADDITIONAL TABLET AS DIRECTED BY HF CLINIC., Disp: 5 tablet, Rfl: 4 .  oxyCODONE-acetaminophen (ROXICET) 5-325 MG tablet, Take 1 tablet by mouth every 4 (four) hours as needed for severe pain., Disp: 60 tablet, Rfl: 0 .  SYMBICORT 160-4.5 MCG/ACT inhaler, Inhale 2 puffs into the lungs 2 (two) times daily., Disp: , Rfl: 8 Allergies  Allergen Reactions  . Gabapentin Nausea And Vomiting    POSSIBLE SHAKING? TAKING MED CURRENTLY     Social History   Social History  . Marital status: Single    Spouse name: N/A  . Number of children: N/A  . Years of education: N/A   Occupational History  . disabled    Social History Main Topics  . Smoking status: Former Smoker    Packs/day: 0.00    Years: 40.00    Types: E-cigarettes    Quit date: 04/05/2017  . Smokeless tobacco: Former Systems developer    Types: Snuff  . Alcohol use No     Comment: former alcohol abuse  . Drug use: No     Comment: 04/29/2015 "last drug use was ~ 09/08/2013"  . Sexual activity: No   Other Topics Concern  . Not on file   Social History Narrative   Lives in [place], in [place] with sister.  They are looking to move but don't have a place to go yet.      Physical Exam  Pulmonary/Chest: No respiratory distress. She has no wheezes. She has no rales.  Abdominal: She exhibits no distension. There is no guarding.  Skin: Skin is warm and dry. She is not diaphoretic.        Future Appointments Date Time Provider [place]  05/12/2017 10:00 AM Newt Minion, MD [facility] None  05/16/2017 11:15 AM [name], MD [facility]  05/29/2017 1:30 PM [facility] NL V[facility] 1 [facility]  06/15/2017 11:20 AM Larey Dresser, MD [facility] None    ATF pt CAO x4 sitting in recliner c/o right  foot pain.  She stated that   he soft cast comes off next Friday per pt.  Pt missed evening medications Sunday night; she has taken her meds this am.  She stated that she ate steak and vegetables last night for dinner.  She denies sob, dizziness, headache and chest pain.  rx bottles verified and pill box refilled.  doxcycline included in pt's pill box although not on pt's med list on epi/last pill in mon morning.  **rx [facility] in: [facility]talked pharmacist whom stated that would call for authorization)  cbg PTA 321 Now 186 BP 124/60 (BP Location: Left  Arm, Patient Position: Sitting, Cuff Size: Large)   Pulse 76   Resp 12   Wt 245 lb 3.2 oz (111.2 kg)   LMP 11/27/2012   SpO2 98%   BMI 43.44 kg/m   Weight yesterday-didn't weigh Last visit weight-258    Murat Rideout, EMT Paramedic 05/04/2017    ACTION: Home visit completed

## 2017-05-11 ENCOUNTER — Other Ambulatory Visit (HOSPITAL_COMMUNITY): Payer: Self-pay

## 2017-05-11 ENCOUNTER — Telehealth: Payer: Self-pay | Admitting: Cardiovascular Disease

## 2017-05-11 MED ORDER — CLOPIDOGREL BISULFATE 75 MG PO TABS: 75.0000 mg | ORAL_TABLET | Freq: Every day | ORAL | 0 refills | Status: DC

## 2017-05-11 NOTE — Telephone Encounter (Signed)
Pt put on Plavix in hospital, 30 day rx. Follows up w Dr. Gwenlyn Found on July 3rd but will run out of medication at that time. Refill sent to patient's preferred pharmacy, pt given instruction to continue as directed and follow up as scheduled.

## 2017-05-11 NOTE — Telephone Encounter (Signed)
New Message  Katy from [facility] call requesting to speak with RN to see if pt needs to continue taking clopidogrel 75mg  .please call back to discuss

## 2017-05-11 NOTE — Progress Notes (Signed)
Paramedicine Encounter    Patient ID: [name], female    DOB: 02-Sep-1961, 56 y.o.   MRN: [id number]   Patient Care Team: Sandi Mariscal, MD as PCP - General (Internal Medicine) [name], MD as Consulting Physician (Cardiology) [name], MD as Consulting Physician (Pulmonary Disease)  Patient Active Problem List   Diagnosis Date Noted  . PAD (peripheral artery disease) ([place])   . Gangrene of toe of right foot ([place])   . Severe sepsis ([place]) 04/06/2017  . CKD (chronic kidney disease), stage III 11/24/2016  . Suspected sleep apnea 11/24/2016  . Severe obesity (BMI >= 40) ([place]) 02/24/2016  . COPD GOLD 0  02/24/2016  . Chronic systolic CHF (congestive heart failure) ([place]) 02/12/2016  . Encounter for therapeutic drug monitoring 02/10/2016  . Symptomatic bradycardia 01/12/2016  . Essential hypertension 12/22/2015  . Wheeze   . 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 Tobacco abuse 10/23/2015  . Coronary artery disease involving native coronary artery of native heart   . DOE (dyspnea on exertion) 04/29/2015  . PAF (paroxysmal atrial fibrillation) ([place]) 01/16/2015  . Carotid arterial disease ([place]) 01/16/2015  . Claudication ([place]) 01/15/2015  . Demand ischemia ([place]) 10/29/2014  . Insomnia 02/03/2014  . S/P peripheral artery angioplasty - TurboHawk atherectomy; R SFA 09/11/2013    Class: Acute  . Leg pain, bilateral 08/19/2013  . Hypothyroidism 07/31/2013  . Cellulitis 06/13/2013  . History of cocaine abuse 06/13/2013  . Long term current use of anticoagulant therapy 05/20/2013  . Alcohol abuse   . Narcotic abuse   . Marijuana abuse   . Alcoholic cirrhosis ([place])   . DM (diabetes mellitus), type 2 with peripheral vascular complications ([name])     Current Outpatient Prescriptions:  .  albuterol (PROVENTIL HFA;VENTOLIN HFA) 108 (90 Base) MCG/ACT inhaler, Inhale 2 puffs into the lungs every 6 (six) hours as needed for wheezing or shortness of breath., Disp: 1 each, Rfl: 3 .   amiodarone (PACERONE) 100 MG tablet, Take 1 tablet (100 mg total) by mouth daily., Disp: 30 tablet, Rfl: 3 .  amLODipine (NORVASC) 10 MG tablet, TAKE ONE TABLET BY MOUTH ONCE DAILY, Disp: 30 tablet, Rfl: 3 .  apixaban (ELIQUIS) 5 MG TABS tablet, Take 1 tablet (5 mg total) by mouth 2 (two) times daily., Disp: 60 tablet, Rfl: 0 .  atorvastatin (LIPITOR) 40 MG tablet, TAKE 1 TABLET BY MOUTH ONCE EVERY EVENING, Disp: 90 tablet, Rfl: 3 .  BELBUCA 75 MCG FILM, Place 1 Film inside cheek 2 (two) times daily. , Disp: , Rfl: 0 .  bisoprolol (ZEBETA) 5 MG tablet, TAKE ONE-HALF ( 2.5MG  TOTAL) TABLET BY MOUTH DAILY, Disp: 15 tablet, Rfl: 3 .  cholecalciferol (VITAMIN D) 1000 units tablet, Take 1,000 Units by mouth daily., Disp: , Rfl:  .  cyanocobalamin 1000 MCG tablet, Take 1 tablet (1,000 mcg total) by mouth daily., Disp: 30 tablet, Rfl: 10 .  folic acid (FOLVITE) 1 MG tablet, TAKE 1 TABLET BY MOUTH EVERY DAY, Disp: 90 tablet, Rfl: 1 .  insulin aspart (NOVOLOG FLEXPEN) 100 UNIT/ML FlexPen, Inject 25 Units into the skin 3 (three) times daily with meals., Disp: 15 mL, Rfl: 11 .  Insulin Glargine (LANTUS SOLOSTAR) 100 UNIT/ML Solostar Pen, Inject 48 Units into the skin daily at 10 pm., Disp: 15 mL, Rfl: 11 .  levothyroxine (SYNTHROID, LEVOTHROID) 50 MCG tablet, Take 1 tablet (50 mcg total) by mouth daily., Disp: 30 tablet, Rfl: 2 .  losartan (COZAAR) 25 MG tablet,  TAKE ONE TABLET BY MOUTH ONCE DAILY, Disp: 90 tablet, Rfl: 3 .  metolazone (ZAROXOLYN) 2.5 MG tablet, TAKE 1 TABLET BY MOUTH ONCE A WEEK ON THURSDAY. MAY TAKE AN ADDITIONAL TABLET AS DIRECTED BY HF CLINIC., Disp: 5 tablet, Rfl: 4 .  Multiple Vitamin (MULTIVITAMIN) tablet, Take 1 tablet by mouth daily., Disp: , Rfl:  .  potassium chloride SA (K-DUR,KLOR-CON) 20 MEQ tablet, Take 1 tablet (20 mEq total) by mouth daily. Take an additional 20 meq (1 tab) on Thursday with metolazone, Disp: 35 tablet, Rfl: 3 .  spironolactone (ALDACTONE) 25 MG tablet, Take 1  tablet (25 mg total) by mouth daily., Disp: 90 tablet, Rfl: 3 .  SYMBICORT 160-4.5 MCG/ACT inhaler, Inhale 2 puffs into the lungs 2 (two) times daily., Disp: , Rfl: 8 .  torsemide (DEMADEX) 20 MG tablet, Take 2 tablets (40 mg total) by mouth 2 (two) times daily., Disp: 360 tablet, Rfl: 3 .  clopidogrel (PLAVIX) 75 MG tablet, Take 1 tablet (75 mg total) by mouth daily with breakfast., Disp: 30 tablet, Rfl: 0 .  doxycycline (VIBRAMYCIN) 100 MG capsule, Take 100 mg by mouth 2 (two) times daily., Disp: , Rfl:  .  oxyCODONE-acetaminophen (ROXICET) 5-325 MG tablet, Take 1 tablet by mouth every 4 (four) hours as needed for severe pain. (Patient not taking: Reported on 05/11/2017), Disp: 60 tablet, Rfl: 0 Allergies  Allergen Reactions  . Gabapentin Nausea And Vomiting    POSSIBLE SHAKING? TAKING MED CURRENTLY     Social History   Social History  . Marital status: Single    Spouse name: N/A  . Number of children: N/A  . Years of education: N/A   Occupational History  . disabled    Social History Main Topics  . Smoking status: Former Smoker    Packs/day: 0.00    Years: 40.00    Types: E-cigarettes    Quit date: 04/05/2017  . Smokeless tobacco: Former Systems developer    Types: Snuff  . Alcohol use No     Comment: former alcohol abuse  . Drug use: No     Comment: 04/29/2015 "last drug use was ~ 09/08/2013"  . Sexual activity: No   Other Topics Concern  . Not on file   Social History Narrative   Lives in [place], in [place] with sister.  They are looking to move but don't have a place to go yet.      Physical Exam      Future Appointments Date Time Provider [place]  05/12/2017 10:00 AM Newt Minion, MD PO-NW None  05/16/2017 11:15 AM [name], MD [facility]  05/29/2017 1:30 PM MC-CV NL VASC 1 [facility]-SE[facility]I [facility]  06/15/2017 11:20 AM Larey Dresser, MD MC-HVSC None   BP 130/70   Pulse 84   Resp 15   Wt 238 lb 3.2 oz (108 kg)   LMP 11/27/2012   SpO2 98%   BMI 42.20  kg/m  Weight yesterday-238.2 Last visit weight-245 CBG PTA-236 [facility]G EMS-403  Pt reports she is doing ok, weight is down from last week, she is on pain pills for her surgery, she has that foot bandaged up, she goes back to her surgeon tomor to see about getting her bandage taken off, she reports the pain pill makes her sick so she hasnt been eating as much as she normally does due to her feeling bad and also trying to lose the weight. She denies any sob, no dizziness, she reports h/a, she reports pain  med does not help with h/a.  She has plavix in through Friday to Monday. No missed doses, meds verified, she had not done her pill box this week yet so I filled it for her. Contacted pharmacy and they will send out her clopidogrel when ready-pt reports that the pharmacy called her the other day and said they could not get the doc to fill her plavix--the doc that prescribed her was hospitalist.. Called her PCP dr sun and the nurse is going to send over note to doc to see if she needs to continue it. She has appointment to see dr berry next week, called his office since he was the one that did her leg stints-left message with nurse for his office and they will call me back. At her amputation site to the toe her bandage is covered with blood-she reports it was dried up and no more leaking but she didn't tell her doc about it.  Dr berry office called me back and he advised they would call in another 30day supply and f/u further at next appointment. Pt is interested in switching PCP offices so I will ask Kennyth Lose about getting that done she also needs help with getting all her bills combined to help so she needs jackies help with that also. Her left leg is swollen which that is the leg she had the surgery done her left leg looks good.   ACTION: Home visit completed  Marylouise Stacks, EMT-Paramedic 05/11/17

## 2017-05-12 ENCOUNTER — Ambulatory Visit (INDEPENDENT_AMBULATORY_CARE_PROVIDER_SITE_OTHER): Payer: Medicare Other | Admitting: Orthopedic Surgery

## 2017-05-12 DIAGNOSIS — I96 Gangrene, not elsewhere classified: Secondary | ICD-10-CM

## 2017-05-12 DIAGNOSIS — I739 Peripheral vascular disease, unspecified: Secondary | ICD-10-CM

## 2017-05-12 DIAGNOSIS — Z89421 Acquired absence of other right toe(s): Secondary | ICD-10-CM

## 2017-05-12 NOTE — Progress Notes (Signed)
Office Visit Note   Patient: [name]           Date of Birth: Mar 05, 1961           MRN: [id number] Visit Date: 05/12/2017              Requested by: Sandi Mariscal, [place], [place] [id number] PCP: Sandi Mariscal, MD  No chief complaint on file.     HPI: The patient is a 56 year old woman who presents today status post right second ray amputation on 04/28/17. Surgical dressing in placed today. States has not been able to keep weight off foot as much as she should.   Assessment & Plan: Visit Diagnoses:  1. H/O amputation of lesser toe, right ([name])   2. Gangrene of toe of right foot ([place])   3. PAD (peripheral artery disease) ([place])     Plan: begin daily dial soap cleansing. Apply dry dressing and ace wrap to RLE. Follow up in office in 1 week to evaluate for suture removal.    Follow-Up Instructions: Return in about 1 week (around 05/19/2017).   Ortho Exam  Patient is alert, oriented, no adenopathy, well-dressed, normal affect, normal respiratory effort. There is dehiscence of the 2nd ray amputation. Is 2 mm deep. 100% exudative tissue in wound bed. Surrounding maceration. No drainage, odor no sign of infection.   Imaging: No results found.  Labs: Lab Results  Component Value Date   HGBA1C 11.1 (H) 10/15/2015   HGBA1C 12.8 (H) 09/05/2015   HGBA1C 9.4 (H) 06/24/2015   LABURIC 15.0 (H) 11/26/2015   REPTSTATUS 04/12/2017 FINAL 04/06/2017   GRAMSTAIN  11/24/2015    FEW WBC PRESENT, PREDOMINANTLY MONONUCLEAR RARE SQUAMOUS EPITHELIAL CELLS PRESENT NO ORGANISMS SEEN Performed at [facility]    CULT NO GROWTH 5 DAYS 04/06/2017   LABORGA ESCHERICHIA COLI (A) 04/06/2017    Orders:  No orders of the defined types were placed in this encounter.  No orders of the defined types were placed in this encounter.    Procedures: No procedures performed  Clinical Data: No additional findings.  ROS:  All other systems negative, except as noted in the  HPI. Review of Systems  Constitutional: Negative for chills and fever.  Respiratory: Negative for shortness of breath.   Cardiovascular: Positive for leg swelling. Negative for chest pain.  Skin: Positive for color change and wound. Negative for rash.    Objective: Vital Signs: LMP 11/27/2012   Specialty Comments:  No specialty comments available.  PMFS History: Patient Active Problem List   Diagnosis Date Noted  . PAD (peripheral artery disease) ([place])   . Gangrene of toe of right foot ([place])   . Severe sepsis ([place]) 04/06/2017  . CKD (chronic kidney disease), stage III 11/24/2016  . Suspected sleep apnea 11/24/2016  . Severe obesity (BMI >= 40) ([place]) 02/24/2016  . COPD GOLD 0  02/24/2016  . Chronic systolic CHF (congestive heart failure) ([place]) 02/12/2016  . Encounter for therapeutic drug monitoring 02/10/2016  . Symptomatic bradycardia 01/12/2016  . Essential hypertension 12/22/2015  . Wheeze   . 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 Tobacco abuse 10/23/2015  . Coronary artery disease involving native coronary artery of native heart   . DOE (dyspnea on exertion) 04/29/2015  . PAF (paroxysmal atrial fibrillation) ([place]) 01/16/2015  . Carotid arterial disease ([place]) 01/16/2015  . Claudication ([place]) 01/15/2015  . Demand ischemia ([place]) 10/29/2014  . Insomnia 02/03/2014  . S/P peripheral artery angioplasty -  TurboHawk atherectomy; R SFA 09/11/2013    Class: Acute  . Leg pain, bilateral 08/19/2013  . Hypothyroidism 07/31/2013  . Cellulitis 06/13/2013  . History of cocaine abuse 06/13/2013  . Long term current use of anticoagulant therapy 05/20/2013  . Alcohol abuse   . Narcotic abuse   . Marijuana abuse   . Alcoholic cirrhosis ([place])   . DM (diabetes mellitus), type 2 with peripheral vascular complications [facility])    Past Medical History:  Diagnosis Date  . Alcohol abuse   . Alcoholic cirrhosis ([place])   . Alcoholic cirrhosis ([place])   . Anemia   . Anxiety   . B12 deficiency    . CAD (coronary artery disease)    a. cath 11/10/2014 LM nl, LAD min irregs, D1 30 ost, D2 50d, LCX 77m, OM1 80 p/m (1.5 mm vessel), OM2 76m, RCA nondom 26m-->med rx.. Demand ischemia in the setting of rapid a-fib.  . Cardiomyopathy ([place])   . Carotid artery disease ([place])    a. 01/2015 Carotid Angio: RICA 409, LICA 81X. s/p L carotid endarterectomy 02/2015.  [name] Chronic combined systolic and diastolic CHF (congestive heart failure) ([place])    a. Last echo 12/205: EF 40-45%, inferoapical/posterior HK, not technically sufficient to allow eval of LV diastolic function, normal LA in size..  . Cocaine abuse   . COPD (chronic obstructive pulmonary disease) ([place])   . Critical lower limb ischemia   . Diabetic peripheral neuropathy ([place])   . Elevated troponin    a. Chronic elevation.  [name] GERD (gastroesophageal reflux disease)   . Hyperlipemia   . Hypertension   . Hypokalemia   . Hypomagnesemia   . Hypothyroidism   . Marijuana abuse   . Narcotic abuse   . Noncompliance   . NSVT (nonsustained ventricular tachycardia) ([place])   . Obesity   . PAF (paroxysmal atrial fibrillation) ([place])   . Paroxysmal atrial tachycardia ([place])   . Paroxysmal atrial tachycardia ([place])   . Peripheral arterial disease ([place])    a. 01/2015 Angio/PTA: LSFA 100 w/ recon @ adductor canal and 1 vessel runoff via AT, RSFA 99 (atherectomy/pta) - 1 vessel runoff via diff dzs peroneal.  . Poorly controlled type 2 diabetes mellitus ([place])   . Renal insufficiency    a. 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  [name] Symptomatic bradycardia    avoid AV blocking agent per EP  . Tobacco abuse     Family History  Problem Relation Age of Onset  . Hypertension Mother   . Diabetes Mother   . Cancer Mother        breast, ovarian, colon  . Clotting disorder Mother   . Heart disease Mother   . Heart attack Mother   . Breast cancer Mother   . Hypertension Father   . Heart disease Father   . Emphysema Sister        smoked    Past Surgical History:    Procedure Laterality Date  . AMPUTATION TOE Right 04/28/2017   Procedure: AMPUTATION OF RIGHT SECOND RAY;  Surgeon: Newt Minion, MD;  Location: [facility];  Service: Orthopedics;  Laterality: Right;  . BALLOON ANGIOPLASTY, ARTERY Right 01/15/2015   SFA/notes 01/15/2015  . CARDIAC CATHETERIZATION    . CARDIAC CATHETERIZATION N/A 01/12/2016   Procedure: Temporary Wire;  Surgeon: Minus Breeding, MD;  Location: [facility] CV LAB;  Service: Cardiovascular;  Laterality: N/A;  . CARDIOVERSION  ~ 02/2013   "twice"   . CAROTID ANGIOGRAM N/A 01/15/2015   Procedure: CAROTID ANGIOGRAM;  Surgeon: [name], MD;  Location: [facility] CATH LAB;  Service: Cardiovascular;  Laterality: N/A;  . DILATION AND CURETTAGE OF UTERUS  1988  . ENDARTERECTOMY Left 02/19/2015   Procedure: LEFT CAROTID ENDARTERECTOMY ;  Surgeon: Serafina Mitchell, MD;  Location: [facility];  Service: Vascular;  Laterality: Left;  . LEFT HEART CATHETERIZATION WITH CORONARY ANGIOGRAM N/A 10/31/2014   Procedure: LEFT HEART CATHETERIZATION WITH CORONARY ANGIOGRAM;  Surgeon: Burnell Blanks, MD;  Location: [facility] CATH LAB;  Service: Cardiovascular;  Laterality: N/A;  . LOWER EXTREMITY ANGIOGRAM N/A 09/10/2013   Procedure: LOWER EXTREMITY ANGIOGRAM;  Surgeon: [name], MD;  Location: [facility] CATH LAB;  Service: Cardiovascular;  Laterality: N/A;  . LOWER EXTREMITY ANGIOGRAM N/A 01/15/2015   Procedure: LOWER EXTREMITY ANGIOGRAM;  Surgeon: [name], MD;  Location: [facility] CATH LAB;  Service: Cardiovascular;  Laterality: N/A;  . LOWER EXTREMITY ANGIOGRAPHY N/A 04/13/2017   Procedure: Lower Extremity Angiography;  Surgeon: [name], MD;  Location: [facility] CV LAB;  Service: Cardiovascular;  Laterality: N/A;  . PERIPHERAL ATHRECTOMY Right 01/15/2015   SFA/notes 01/15/2015  . PERIPHERAL VASCULAR INTERVENTION  04/13/2017   Procedure: Peripheral Vascular Intervention;  Surgeon: [name], MD;  Location: [facility] CV LAB;  Service:  Cardiovascular;;   Social History   Occupational History  . disabled    Social History Main Topics  . Smoking status: Former Smoker    Packs/day: 0.00    Years: 40.00    Types: E-cigarettes    Quit date: 04/05/2017  . Smokeless tobacco: Former Systems developer    Types: Snuff  . Alcohol use No     Comment: former alcohol abuse  . Drug use: No     Comment: 04/29/2015 "last drug use was ~ 09/08/2013"  . Sexual activity: No

## 2017-05-16 ENCOUNTER — Encounter: Payer: Self-pay | Admitting: Cardiovascular Disease

## 2017-05-16 ENCOUNTER — Ambulatory Visit (INDEPENDENT_AMBULATORY_CARE_PROVIDER_SITE_OTHER): Payer: Medicare Other | Admitting: Cardiovascular Disease

## 2017-05-16 DIAGNOSIS — I480 Paroxysmal atrial fibrillation: Secondary | ICD-10-CM

## 2017-05-16 DIAGNOSIS — I5022 Chronic systolic (congestive) heart failure: Secondary | ICD-10-CM

## 2017-05-16 DIAGNOSIS — I779 Disorder of arteries and arterioles, unspecified: Secondary | ICD-10-CM | POA: Diagnosis not present

## 2017-05-16 DIAGNOSIS — I2510 Atherosclerotic heart disease of native coronary artery without angina pectoris: Secondary | ICD-10-CM | POA: Diagnosis not present

## 2017-05-16 DIAGNOSIS — I739 Peripheral vascular disease, unspecified: Secondary | ICD-10-CM | POA: Diagnosis not present

## 2017-05-16 DIAGNOSIS — I10 Essential (primary) hypertension: Secondary | ICD-10-CM

## 2017-05-16 DIAGNOSIS — Z720 Tobacco use: Secondary | ICD-10-CM | POA: Diagnosis not present

## 2017-05-16 NOTE — Assessment & Plan Note (Signed)
History of PAD status post multiple interventions on her right SFA most recently by myself on 04/13/17.I Atherectomized her entire occluded SFA here she did have 0 vessel runoff. She subsequently had a second ray resection by Dr. Sharol Given  on 04/28/17 because of gangrene. We will recheck lower extremity arterial Doppler studies. She is scheduled to see Dr. Sharol Given  back in the office this week.

## 2017-05-16 NOTE — Assessment & Plan Note (Signed)
History of coronary artery disease status post cardiac catheterization performed 10/31/14 revealing diffuse disease that was treated medically. She denies chest pain.

## 2017-05-16 NOTE — Assessment & Plan Note (Signed)
Recent 2-D echo performed 11/17/16 revealed revealed an ejection fraction of 40-45% with grade 2 diastolic dysfunction. She is on torsemide ,spironolactone, and metolazone.

## 2017-05-16 NOTE — Progress Notes (Signed)
05/16/2017 [name]   [birth year]  [id number]  Primary Physician Sandi Mariscal, MD Primary Cardiologist: [name] MD Garret Reddish, [place], [place]  HPI:  [name] is a 56 year old moderately overweight single Caucasian female mother of one child who lives with her sister who accompanies her today. She was referred by Dr. Franki Monte at [facility] for peripheral vascular evaluation because of critical limb ischemia. I last saw her in the office 03/01/16. She sees Dr. Jenkins Rouge at [facility] for cardiomyopathy and paroxysmal atrial fibrillation. Her cardiac risk factors include a 20-pack-year history of tobacco abuse (she has stopped smoking), treated diabetes, and hypertension as well as family history of heart disease. She's never had a heart attack or stroke. She does have COPD. She had paroxysmal atrial fibrillation in the past and has undergone cardioversion in [place].. The lower extremity arterial Doppler studies performed in our office suggested critical limb ischemia with an occluded SFA and popliteal arteries bilaterally with tibial vessel disease as well. She complains of lifestyle limiting claudication. I performed lower extremity angiography on 09/10/13 revealing occluded SFAs bilaterally with chronic occluded tibial vessels as well. I performed TurboHawk directional atherectomy of her entire right SFA with an excellent angiographic result removing a copious amount of atherosclerotic plaque. The ulcer on her right heel ultimately healed. She does continue to smoke one pack per day. She was recently admitted to [facility] because of atrial fibrillation with rapid ventricular response. She underwent cardiac catheterization on 10/31/14 revealing an ejection fraction of 30-35% with disease that was ultimately treated medically. Her carotid Doppler showed high-grade bilateral ICA disease and lower extremity Dopplers revealed restenosis within the right SFA with a  ABI of 0.34. She is symptomatic on that side. I performed angiography on her 01/15/15 revealing an occluded right internal carotid artery, 95% proximal left internal carotid artery stenosis, long 99% stenosis within the right SFA and an occluded left SFA. I performed [facility] 1 directional atherectomy and drug-eluting balloon angioplasty of her right SFA with an excellent Angiographic and clinical result. She underwent left carotid endarterectomy by Dr. Trula Slade on 02/19/15 which was uncomplicated. Since I saw her 6 months ago she's been remaining clinically stable. She denies chest pain or shortness of breath. She continues to have claudication.Lower extremity Doppler studies performed 01/23/15 revealed ABIs in the 0.6 range bilaterally with a patent right SFA. She continues to complain of claudication however.recent Dopplers performed 12/22/15 revealed a right TBI 0. 17. She was admitted to the hospital with altered mental status and sepsis 04/06/17 for one week. 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 I suspect performed angiography of her 04/13/17 revealing an occluded right SFA was 0 vessel runoff. Atherectomy I's are entire right SFA and she subsequently underwent right second ray resection by Dr. Sharol Given to on 04/28/17.She has stopped smoking since that hospitalization.   Current Outpatient Prescriptions  Medication Sig Dispense Refill  . albuterol (PROVENTIL HFA;VENTOLIN HFA) 108 (90 Base) MCG/ACT inhaler Inhale 2 puffs into the lungs every 6 (six) hours as needed for wheezing or shortness of breath. 1 each 3  . amiodarone (PACERONE) 100 MG tablet Take 1 tablet (100 mg total) by mouth daily. 30 tablet 3  . amLODipine (NORVASC) 10 MG tablet TAKE ONE TABLET BY MOUTH ONCE DAILY 30 tablet 3  . apixaban (ELIQUIS) 5 MG TABS tablet Take 1 tablet (5 mg total) by mouth 2 (two) times daily. 60 tablet 0  .  atorvastatin (LIPITOR) 40 MG tablet TAKE 1 TABLET BY MOUTH ONCE EVERY EVENING 90 tablet 3  . BELBUCA 75  MCG FILM Place 1 Film inside cheek 2 (two) times daily.   0  . bisoprolol (ZEBETA) 5 MG tablet TAKE ONE-HALF ( 2.5MG  TOTAL) TABLET BY MOUTH DAILY 15 tablet 3  . cholecalciferol (VITAMIN D) 1000 units tablet Take 1,000 Units by mouth daily.    . clopidogrel (PLAVIX) 75 MG tablet Take 1 tablet (75 mg total) by mouth daily with breakfast. 30 tablet 0  . cyanocobalamin 1000 MCG tablet Take 1 tablet (1,000 mcg total) by mouth daily. 30 tablet 10  . doxycycline (VIBRAMYCIN) 100 MG capsule Take 100 mg by mouth 2 (two) times daily.    . folic acid (FOLVITE) 1 MG tablet TAKE 1 TABLET BY MOUTH EVERY DAY 90 tablet 1  . insulin aspart (NOVOLOG FLEXPEN) 100 UNIT/ML FlexPen Inject 25 Units into the skin 3 (three) times daily with meals. 15 mL 11  . Insulin Glargine (LANTUS SOLOSTAR) 100 UNIT/ML Solostar Pen Inject 48 Units into the skin daily at 10 pm. 15 mL 11  . levothyroxine (SYNTHROID, LEVOTHROID) 50 MCG tablet Take 1 tablet (50 mcg total) by mouth daily. 30 tablet 2  . losartan (COZAAR) 25 MG tablet TAKE ONE TABLET BY MOUTH ONCE DAILY 90 tablet 3  . metolazone (ZAROXOLYN) 2.5 MG tablet TAKE 1 TABLET BY MOUTH ONCE A WEEK ON THURSDAY. MAY TAKE AN ADDITIONAL TABLET AS DIRECTED BY HF CLINIC. 5 tablet 4  . Multiple Vitamin (MULTIVITAMIN) tablet Take 1 tablet by mouth daily.    [name] oxyCODONE-acetaminophen (ROXICET) 5-325 MG tablet Take 1 tablet by mouth every 4 (four) hours as needed for severe pain. 60 tablet 0  . potassium chloride SA (K-DUR,KLOR-CON) 20 MEQ tablet Take 1 tablet (20 mEq total) by mouth daily. Take an additional 20 meq (1 tab) on Thursday with metolazone 35 tablet 3  . spironolactone (ALDACTONE) 25 MG tablet Take 1 tablet (25 mg total) by mouth daily. 90 tablet 3  . SYMBICORT 160-4.5 MCG/ACT inhaler Inhale 2 puffs into the lungs 2 (two) times daily.  8  . torsemide (DEMADEX) 20 MG tablet Take 2 tablets (40 mg total) by mouth 2 (two) times daily. 360 tablet 3   No current facility-administered  medications for this visit.     Allergies  Allergen Reactions  . Gabapentin Nausea And Vomiting    POSSIBLE SHAKING? TAKING MED CURRENTLY    Social History   Social History  . Marital status: Single    Spouse name: N/A  . Number of children: N/A  . Years of education: N/A   Occupational History  . disabled    Social History Main Topics  . Smoking status: Former Smoker    Packs/day: 0.00    Years: 40.00    Types: E-cigarettes    Quit date: 04/05/2017  . Smokeless tobacco: Former Systems developer    Types: Snuff  . Alcohol use No     Comment: former alcohol abuse  . Drug use: No     Comment: 04/29/2015 "last drug use was ~ 09/08/2013"  . Sexual activity: No   Other Topics Concern  . Not on file   Social History Narrative   Lives in [place], in [place] with sister.  They are looking to move but don't have a place to go yet.       Review of Systems: General: negative for chills, fever, night sweats or weight changes.  Cardiovascular: negative for  chest pain, dyspnea on exertion, edema, orthopnea, palpitations, paroxysmal nocturnal dyspnea or shortness of breath Dermatological: negative for rash Respiratory: negative for cough or wheezing Urologic: negative for hematuria Abdominal: negative for nausea, vomiting, diarrhea, bright red blood per rectum, melena, or hematemesis Neurologic: negative for visual changes, syncope, or dizziness All other systems reviewed and are otherwise negative except as noted above.    Blood pressure 113/67, pulse 87, height 5\' 4"  (1.626 m), weight 248 lb (112.5 kg), last menstrual period 11/27/2012, SpO2 97 %.  General appearance: alert and no distress Neck: no adenopathy, no carotid bruit, no JVD, supple, symmetrical, trachea midline and thyroid not enlarged, symmetric, no tenderness/mass/nodules Lungs: clear to auscultation bilaterally Heart: regular rate and rhythm, S1, S2 normal, no murmur, click, rub or gallop Extremities: extremities normal,  atraumatic, no cyanosis or edema  EKG not performed today  ASSESSMENT AND PLAN:   PAF (paroxysmal atrial fibrillation) ([name]) History of PAF on Eliquis oral anticoagulation.  Carotid arterial disease ([name]) History of carotid artery disease status post left carotid endarterectomy performed by Dr. Trula Slade 02/19/15 which was uncomplicated. I did perform perform coronary angiography of her 01/15/15 revealing an occluded right internal carotid artery and a 95% proximal left ICA stenosis. We are continuing to follow her by duplex ultrasound.  Tobacco abuse Long history, recently discontinued after her recent hospitalization.  Essential hypertension History of essential hypertension blood pressure measured 113/67. She is on amlodipine, Zebeta and losartan. Continue current meds at current dosing  Chronic systolic CHF (congestive heart failure) ([place]) Recent 2-D echo performed 11/17/16 revealed revealed an ejection fraction of 40-45% with grade 2 diastolic dysfunction. She is on torsemide ,spironolactone, and metolazone.  Coronary artery disease involving native coronary artery of native heart History of coronary artery disease status post cardiac catheterization performed 10/31/14 revealing diffuse disease that was treated medically. She denies chest pain.  PAD (peripheral artery disease) ([name]) History of PAD status post multiple interventions on her right SFA most recently by myself on 04/13/17.I Atherectomized her entire occluded SFA here she did have 0 vessel runoff. She subsequently had a second ray resection by Dr. Sharol Given  on 04/28/17 because of gangrene. We will recheck lower extremity arterial Doppler studies. She is scheduled to see Dr. Sharol Given  back in the office this week.      [name] MD FACP,FACC,FAHA, [facility] 05/16/2017 11:52 AM

## 2017-05-16 NOTE — Patient Instructions (Signed)
Medication Instructions: Your physician recommends that you continue on your current medications as directed. Please refer to the Current Medication list given to you today.   Testing/Procedures: You are scheduled for Lower Extremity Doppler on Monday, 7/16.  Follow-Up: Your physician recommends that you schedule a follow-up appointment in: 3 months with Dr. Gwenlyn Found.  If you need a refill on your cardiac medications before your next appointment, please call your pharmacy.

## 2017-05-16 NOTE — Assessment & Plan Note (Signed)
History of carotid artery disease status post left carotid endarterectomy performed by Dr. Trula Slade 02/19/15 which was uncomplicated. I did perform perform coronary angiography of her 01/15/15 revealing an occluded right internal carotid artery and a 95% proximal left ICA stenosis. We are continuing to follow her by duplex ultrasound.

## 2017-05-16 NOTE — Assessment & Plan Note (Signed)
Long history, recently discontinued after her recent hospitalization.

## 2017-05-16 NOTE — Assessment & Plan Note (Signed)
History of essential hypertension blood pressure measured 113/67. She is on amlodipine, Zebeta and losartan. Continue current meds at current dosing

## 2017-05-16 NOTE — Assessment & Plan Note (Signed)
History of PAF on Eliquis oral anticoagulation. 

## 2017-05-18 ENCOUNTER — Ambulatory Visit (INDEPENDENT_AMBULATORY_CARE_PROVIDER_SITE_OTHER): Payer: Medicare Other | Admitting: Orthopedic Surgery

## 2017-05-19 ENCOUNTER — Ambulatory Visit (INDEPENDENT_AMBULATORY_CARE_PROVIDER_SITE_OTHER): Payer: Medicare Other | Admitting: Orthopedic Surgery

## 2017-05-19 ENCOUNTER — Encounter (INDEPENDENT_AMBULATORY_CARE_PROVIDER_SITE_OTHER): Payer: Self-pay | Admitting: Orthopedic Surgery

## 2017-05-19 VITALS — Ht 64.0 in | Wt 248.0 lb

## 2017-05-19 DIAGNOSIS — L97919 Non-pressure chronic ulcer of unspecified part of right lower leg with unspecified severity: Secondary | ICD-10-CM | POA: Insufficient documentation

## 2017-05-19 DIAGNOSIS — I87333 Chronic venous hypertension (idiopathic) with ulcer and inflammation of bilateral lower extremity: Secondary | ICD-10-CM

## 2017-05-19 DIAGNOSIS — E1151 Type 2 diabetes mellitus with diabetic peripheral angiopathy without gangrene: Secondary | ICD-10-CM

## 2017-05-19 DIAGNOSIS — Z89421 Acquired absence of other right toe(s): Secondary | ICD-10-CM | POA: Diagnosis not present

## 2017-05-19 DIAGNOSIS — L97929 Non-pressure chronic ulcer of unspecified part of left lower leg with unspecified severity: Secondary | ICD-10-CM

## 2017-05-19 NOTE — Progress Notes (Signed)
Office Visit Note   Patient: [name]           Date of Birth: 09/26/1961           MRN: [id number] Visit Date: 05/19/2017              Requested by: Sandi Mariscal, [place], [place] [id number] PCP: Sandi Mariscal, MD  Chief Complaint  Patient presents with  . Right Foot - Routine Post Op    04/28/17 right second ray amputation       HPI: Patient is a 56 year old woman with uncontrolled type 2 diabetes she states her sugars running as high as 500 normally in the 200s to 300s. Patient has been leaving her leg dependent because she states it hurts to elevate her leg and has massive weeping edema from her right leg. The left leg is wearing a medical compression stocking. Patient states that Dr. Alvester Chou has recommended that she go to the wound center for evaluation of her venous stasis swelling.  Assessment & Plan: Visit Diagnoses:  1. H/O amputation of lesser toe, right ([place])   2. DM (diabetes mellitus), type 2 with peripheral vascular complications ([name])   3. 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 She does have a strong dopplerable dorsalis pedis pulse and arterial inflow seems good. Discussed that with the weeping edema ulceration dermatitis and swelling she is at risk of loss of foot with wound dehiscence from swelling. Again reinforced the importance of elevation she states she understands. She does have a follow-up appointment with vascular surgery to see if there are any revascularization options.  Plan for a Silver cell to the wound pleasant Dynaflex wraps. Follow-up Monday for reevaluation.  Follow-Up Instructions: Return in about 1 week (around 05/26/2017).   Ortho Exam  Patient is alert, oriented, no adenopathy, well-dressed, normal affect, normal respiratory effort. Examination patient has massive venous stasis swelling worse than the right leg  and the left leg there is brawny skin color changes there are blistering ulcers due to swelling with clear weeping edema from the right leg. There is no cellulitis no signs of infection. The surgical wound has dehisced secondary to swelling there is fibrinous tissue at the base of the wound. We will harvest the sutures they are not holding the wound together at this point.  Imaging: No results found.  Labs: Lab Results  Component Value Date   HGBA1C 11.1 (H) 10/15/2015   HGBA1C 12.8 (H) 09/05/2015   HGBA1C 9.4 (H) 06/24/2015   LABURIC 15.0 (H) 11/26/2015   REPTSTATUS 04/12/2017 FINAL 04/06/2017   GRAMSTAIN  11/24/2015    FEW WBC PRESENT, PREDOMINANTLY MONONUCLEAR RARE SQUAMOUS EPITHELIAL CELLS PRESENT NO ORGANISMS SEEN Performed at [facility]    CULT NO GROWTH 5 DAYS 04/06/2017   LABORGA ESCHERICHIA COLI (A) 04/06/2017    Orders:  No orders of the defined types were placed in this encounter.  No orders of the defined types were placed in this encounter.    Procedures: No procedures performed  Clinical Data: No additional findings.  ROS:  All other systems negative, except as noted in the HPI. Review of Systems  Objective: Vital Signs: Ht 5\' 4"  (1.626 m)   Wt 248 lb (112.5 kg)   LMP 11/27/2012   BMI 42.57 kg/m   Specialty Comments:  No specialty comments available.  PMFS History: Patient Active Problem  List   Diagnosis Date Noted  . 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 H/O amputation of lesser toe, right ([place]) 05/19/2017  . PAD (peripheral artery disease) ([place])   . Gangrene of toe of right foot ([place])   . Severe sepsis ([place]) 04/06/2017  . CKD (chronic kidney disease), stage III 11/24/2016  . Suspected sleep apnea 11/24/2016  . Severe obesity (BMI >= 40) ([place]) 02/24/2016  . COPD GOLD 0  02/24/2016  . Chronic systolic CHF (congestive heart failure) ([place]) 02/12/2016  .  Encounter for therapeutic drug monitoring 02/10/2016  . Symptomatic bradycardia 01/12/2016  . Essential hypertension 12/22/2015  . Wheeze   . 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 Tobacco abuse 10/23/2015  . Coronary artery disease involving native coronary artery of native heart   . DOE (dyspnea on exertion) 04/29/2015  . PAF (paroxysmal atrial fibrillation) ([place]) 01/16/2015  . Carotid arterial disease ([place]) 01/16/2015  . Claudication ([place]) 01/15/2015  . Demand ischemia ([place]) 10/29/2014  . Insomnia 02/03/2014  . S/P peripheral artery angioplasty - TurboHawk atherectomy; R SFA 09/11/2013    Class: Acute  . Leg pain, bilateral 08/19/2013  . Hypothyroidism 07/31/2013  . Cellulitis 06/13/2013  . History of cocaine abuse 06/13/2013  . Long term current use of anticoagulant therapy 05/20/2013  . Alcohol abuse   . Narcotic abuse   . Marijuana abuse   . Alcoholic cirrhosis ([place])   . DM (diabetes mellitus), type 2 with peripheral vascular complications [facility])    Past Medical History:  Diagnosis Date  . Alcohol abuse   . Alcoholic cirrhosis ([place])   . Alcoholic cirrhosis ([place])   . Anemia   . Anxiety   . B12 deficiency   . CAD (coronary artery disease)    a. cath 11/10/2014 LM nl, LAD min irregs, D1 30 ost, D2 50d, LCX 68m, OM1 80 p/m (1.5 mm vessel), OM2 38m, RCA nondom 74m-->med rx.. Demand ischemia in the setting of rapid a-fib.  . Cardiomyopathy ([place])   . Carotid artery disease ([place])    a. 01/2015 Carotid Angio: RICA 161, LICA 09U. s/p L carotid endarterectomy 02/2015.  [name] Chronic combined systolic and diastolic CHF (congestive heart failure) ([place])    a. Last echo 12/205: EF 40-45%, inferoapical/posterior HK, not technically sufficient to allow eval of LV diastolic function, normal LA in size..  . Cocaine abuse   . COPD (chronic obstructive pulmonary disease) ([place])   . Critical lower limb ischemia   . Diabetic peripheral neuropathy ([place])   . Elevated troponin    a. Chronic  elevation.  [name] GERD (gastroesophageal reflux disease)   . Hyperlipemia   . Hypertension   . Hypokalemia   . Hypomagnesemia   . Hypothyroidism   . Marijuana abuse   . Narcotic abuse   . Noncompliance   . NSVT (nonsustained ventricular tachycardia) ([place])   . Obesity   . PAF (paroxysmal atrial fibrillation) ([place])   . Paroxysmal atrial tachycardia ([place])   . Paroxysmal atrial tachycardia ([place])   . Peripheral arterial disease ([place])    a. 01/2015 Angio/PTA: LSFA 100 w/ recon @ adductor canal and 1 vessel runoff via AT, RSFA 99 (atherectomy/pta) - 1 vessel runoff via diff dzs peroneal.  . Poorly controlled type 2 diabetes mellitus ([place])   . Renal insufficiency    a. 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  [name] Symptomatic bradycardia    avoid AV blocking agent per EP  . Tobacco abuse  Family History  Problem Relation Age of Onset  . Hypertension Mother   . Diabetes Mother   . Cancer Mother        breast, ovarian, colon  . Clotting disorder Mother   . Heart disease Mother   . Heart attack Mother   . Breast cancer Mother   . Hypertension Father   . Heart disease Father   . Emphysema Sister        smoked    Past Surgical History:  Procedure Laterality Date  . AMPUTATION TOE Right 04/28/2017   Procedure: AMPUTATION OF RIGHT SECOND RAY;  Surgeon: Newt Minion, MD;  Location: [place];  Service: Orthopedics;  Laterality: Right;  . BALLOON ANGIOPLASTY, ARTERY Right 01/15/2015   SFA/notes 01/15/2015  . CARDIAC CATHETERIZATION    . CARDIAC CATHETERIZATION N/A 01/12/2016   Procedure: Temporary Wire;  Surgeon: Minus Breeding, MD;  Location: [place] [facility];  Service: Cardiovascular;  Laterality: N/A;  . CARDIOVERSION  ~ 02/2013   "twice"   . CAROTID ANGIOGRAM N/A 01/15/2015   Procedure: CAROTID ANGIOGRAM;  Surgeon: [name], MD;  Location[facility] CATH LAB;  Service: Cardiovascular;  Laterality: N/A;  . DILATION AND CURETTAGE OF UTERUS  1988  . ENDARTERECTOMY Left 02/19/2015   Procedure: LEFT CAROTID  ENDARTERECTOMY ;  Surgeon: Serafina Mitchell, MD;  Location: [place];  Service: Vascular;  Laterality: Left;  . LEFT HEART CATHETERIZATION WITH CORONARY ANGIOGRAM N/A 10/31/2014   Procedure: LEFT HEART CATHETERIZATION WITH CORONARY ANGIOGRAM;  Surgeon: Burnell Blanks, MD;  Location: [facility] CATH LAB;  Service: Cardiovascular;  Laterality: N/A;  . LOWER EXTREMITY ANGIOGRAM N/A 09/10/2013   Procedure: LOWER EXTREMITY ANGIOGRAM;  Surgeon: [name], MD;  [facility] C[facility] LAB;  Service: Cardiovascular;  Laterality: N/A;  . LOWER EXTREMITY ANGIOGRAM N/A 01/15/2015   Procedure: LOWER EXTREMITY ANGIOGRAM;  Surgeon: [name], MD;  Location: [facility] CATH LAB;  Service: Cardiovascular;  Laterality: N/A;  . LOWER EXTREMITY ANGIOGRAPHY N/A 04/13/2017   Procedure: Lower Extremity Angiography;  Surgeon: [name], MD;  Location: [place] [facility];  Service: Cardiovascular;  Laterality: N/A;  . PERIPHERAL ATHRECTOMY Right 01/15/2015   SFA/notes 01/15/2015  . PERIPHERAL VASCULAR INTERVENTION  04/13/2017   Procedure: Peripheral Vascular Intervention;  Surgeon: [name], MD;  Location: [place] [facility];  Service: Cardiovascular;;   Social History   Occupational History  . disabled    Social History Main Topics  . Smoking status: Former Smoker    Packs/day: 0.00    Years: 40.00    Types: E-cigarettes    Quit date: 04/05/2017  . Smokeless tobacco: Former Systems developer    Types: Snuff  . Alcohol use No     Comment: former alcohol abuse  . Drug use: No     Comment: 04/29/2015 "last drug use was ~ 09/08/2013"  . Sexual activity: No

## 2017-05-22 ENCOUNTER — Ambulatory Visit (INDEPENDENT_AMBULATORY_CARE_PROVIDER_SITE_OTHER): Payer: Medicare Other | Admitting: Orthopedic Surgery

## 2017-05-22 ENCOUNTER — Encounter (INDEPENDENT_AMBULATORY_CARE_PROVIDER_SITE_OTHER): Payer: Self-pay | Admitting: Orthopedic Surgery

## 2017-05-22 DIAGNOSIS — Z89421 Acquired absence of other right toe(s): Secondary | ICD-10-CM | POA: Diagnosis not present

## 2017-05-22 MED ORDER — OXYCODONE-ACETAMINOPHEN 5-325 MG PO TABS: 1.0000 | ORAL_TABLET | Freq: Three times a day (TID) | ORAL | 0 refills | Status: DC | PRN

## 2017-05-22 MED ORDER — DOXYCYCLINE HYCLATE 100 MG PO TABS: 100.0000 mg | ORAL_TABLET | Freq: Two times a day (BID) | ORAL | 0 refills | Status: DC

## 2017-05-22 NOTE — Progress Notes (Signed)
Office Visit Note   Patient: [name]           Date of Birth: 05-Dec-1960           MRN: [id number] Visit Date: 05/22/2017              Requested by: Sandi Mariscal, [place], [place] [address] PCP: Sandi Mariscal, MD  Chief Complaint  Patient presents with  . Right Foot - Follow-up, Dressing Change      HPI: Venous stasis swelling with wound dehiscence second Ray amputation right foot.  Assessment & Plan: Visit Diagnoses:  1. H/O amputation of lesser toe, right ([name])     Plan: Apply Iodosorb plus 4 x 4's was a Dynaflex wraps. Discussed the importance of not allowing her dressing to slide down. She will follow-up immediately if the dressing slides down she is given instructions for strict elevation minimize weightbearing on the right lower extremity. Follow-up with Erin in 1 week to reapply iodosorb and  a Dynaflex wraps and follow up with myself in 2 weeks. Patient states that she is currently not taking antibiotics and we will call in a prescription for doxycycline to her pharmacy [facility] [facility].  Follow-Up Instructions: Return in about 1 week (around 05/29/2017).   Ortho Exam  Patient is alert, oriented, no adenopathy, well-dressed, normal affect, normal respiratory effort. Examination patient does complain of pain in the right lower extremity she has massive swelling in the calf she does have a good granulating venous stasis ulcer with surrounding dermatitis. The swelling the foot is improving the wound dehiscence has decreased the wound is currently gaped open 2 mm x 15 mm in length. There is fibrinous tissue at the base of the wound.  Imaging: No results found.  Labs: Lab Results  Component Value Date   HGBA1C 11.1 (H) 10/15/2015   HGBA1C 12.8 (H) 09/05/2015   HGBA1C 9.4 (H) 06/24/2015   LABURIC 15.0 (H) 11/26/2015   REPTSTATUS 04/12/2017 FINAL 04/06/2017   GRAMSTAIN  11/24/2015    FEW WBC PRESENT, PREDOMINANTLY MONONUCLEAR RARE SQUAMOUS EPITHELIAL  CELLS PRESENT NO ORGANISMS SEEN Performed at [facility]    CULT NO GROWTH 5 DAYS 04/06/2017   LABORGA ESCHERICHIA COLI (A) 04/06/2017    Orders:  No orders of the defined types were placed in this encounter.  Meds ordered this encounter  Medications  . oxyCODONE-acetaminophen (ROXICET) 5-325 MG tablet    Sig: Take 1 tablet by mouth every 8 (eight) hours as needed for severe pain.    Dispense:  30 tablet    Refill:  0     Procedures: No procedures performed  Clinical Data: No additional findings.  ROS:  All other systems negative, except as noted in the HPI. Review of Systems  Objective: Vital Signs: LMP 11/27/2012   Specialty Comments:  No specialty comments available.  PMFS History: Patient Active Problem List   Diagnosis Date Noted  . 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 ([place]) 05/19/2017  . H/O amputation of lesser toe, right ([place]) 05/19/2017  . PAD (peripheral artery disease) ([place])   . Gangrene of toe of right foot ([place])   . Severe sepsis ([place]) 04/06/2017  . CKD (chronic kidney disease), stage III 11/24/2016  . Suspected sleep apnea 11/24/2016  . Severe obesity (BMI >= 40) ([place]) 02/24/2016  . COPD GOLD 0  02/24/2016  . Chronic systolic CHF (congestive heart failure) ([place]) 02/12/2016  . Encounter for therapeutic drug  monitoring 02/10/2016  . Symptomatic bradycardia 01/12/2016  . Essential hypertension 12/22/2015  . Wheeze   . 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 Tobacco abuse 10/23/2015  . Coronary artery disease involving native coronary artery of native heart   . DOE (dyspnea on exertion) 04/29/2015  . PAF (paroxysmal atrial fibrillation) ([place]) 01/16/2015  . Carotid arterial disease ([place]) 01/16/2015  . Claudication ([place]) 01/15/2015  . Demand ischemia ([place]) 10/29/2014  . Insomnia 02/03/2014  . S/P peripheral artery angioplasty - TurboHawk atherectomy; R SFA 09/11/2013    Class: Acute    . Leg pain, bilateral 08/19/2013  . Hypothyroidism 07/31/2013  . Cellulitis 06/13/2013  . History of cocaine abuse 06/13/2013  . Long term current use of anticoagulant therapy 05/20/2013  . Alcohol abuse   . Narcotic abuse   . Marijuana abuse   . Alcoholic cirrhosis ([place])   . DM (diabetes mellitus), type 2 with peripheral vascular complications [facility])    Past Medical History:  Diagnosis Date  . Alcohol abuse   . Alcoholic cirrhosis ([place])   . Alcoholic cirrhosis ([place])   . Anemia   . Anxiety   . B12 deficiency   . CAD (coronary artery disease)    a. cath 11/10/2014 LM nl, LAD min irregs, D1 30 ost, D2 50d, LCX 79m, OM1 80 p/m (1.5 mm vessel), OM2 86m, RCA nondom 20m-->med rx.. Demand ischemia in the setting of rapid a-fib.  . Cardiomyopathy ([place])   . Carotid artery disease ([place])    a. 01/2015 Carotid Angio: RICA 154, LICA 00Q. s/p L carotid endarterectomy 02/2015.  [name] Chronic combined systolic and diastolic CHF (congestive heart failure) ([place])    a. Last echo 12/205: EF 40-45%, inferoapical/posterior HK, not technically sufficient to allow eval of LV diastolic function, normal LA in size..  . Cocaine abuse   . COPD (chronic obstructive pulmonary disease) ([place])   . Critical lower limb ischemia   . Diabetic peripheral neuropathy ([place])   . Elevated troponin    a. Chronic elevation.  [name] GERD (gastroesophageal reflux disease)   . Hyperlipemia   . Hypertension   . Hypokalemia   . Hypomagnesemia   . Hypothyroidism   . Marijuana abuse   . Narcotic abuse   . Noncompliance   . NSVT (nonsustained ventricular tachycardia) ([place])   . Obesity   . PAF (paroxysmal atrial fibrillation) ([place])   . Paroxysmal atrial tachycardia ([place])   . Paroxysmal atrial tachycardia ([place])   . Peripheral arterial disease ([place])    a. 01/2015 Angio/PTA: LSFA 100 w/ recon @ adductor canal and 1 vessel runoff via AT, RSFA 99 (atherectomy/pta) - 1 vessel runoff via diff dzs peroneal.  . Poorly controlled type 2  diabetes mellitus ([place])   . Renal insufficiency    a. 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  [name] Symptomatic bradycardia    avoid AV blocking agent per EP  . Tobacco abuse     Family History  Problem Relation Age of Onset  . Hypertension Mother   . Diabetes Mother   . Cancer Mother        breast, ovarian, colon  . Clotting disorder Mother   . Heart disease Mother   . Heart attack Mother   . Breast cancer Mother   . Hypertension Father   . Heart disease Father   . Emphysema Sister        smoked    Past Surgical History:  Procedure Laterality Date  . AMPUTATION TOE Right 04/28/2017   Procedure: AMPUTATION OF  RIGHT SECOND RAY;  Surgeon: Newt Minion, MD;  Location: [facility];  Service: Orthopedics;  Laterality: Right;  . BALLOON ANGIOPLASTY, ARTERY Right 01/15/2015   SFA/notes 01/15/2015  . CARDIAC CATHETERIZATION    . CARDIAC CATHETERIZATION N/A 01/12/2016   Procedure: Temporary Wire;  Surgeon: Minus Breeding, MD;  Location: [name] CV LAB;  Service: Cardiovascular;  Laterality: N/A;  . CARDIOVERSION  ~ 02/2013   "twice"   . CAROTID ANGIOGRAM N/A 01/15/2015   Procedure: CAROTID ANGIOGRAM;  Surgeon: [name], MD;  Location: [facility] CATH LAB;  Service: Cardiovascular;  Laterality: N/A;  . DILATION AND CURETTAGE OF UTERUS  1988  . ENDARTERECTOMY Left 02/19/2015   Procedure: LEFT CAROTID ENDARTERECTOMY ;  Surgeon: Serafina Mitchell, MD;  Location: [facility];  Service: Vascular;  Laterality: Left;  . LEFT HEART CATHETERIZATION WITH CORONARY ANGIOGRAM N/A 10/31/2014   Procedure: LEFT HEART CATHETERIZATION WITH CORONARY ANGIOGRAM;  Surgeon: Burnell Blanks, MD;  Location: [facility] CATH LAB;  Service: Cardiovascular;  Laterality: N/A;  . LOWER EXTREMITY ANGIOGRAM N/A 09/10/2013   Procedure: LOWER EXTREMITY ANGIOGRAM;  Surgeon: [name], MD;  Location: [facility] CATH LAB;  Service: Cardiovascular;  Laterality: N/A;  . LOWER EXTREMITY ANGIOGRAM N/A 01/15/2015   Procedure: LOWER EXTREMITY ANGIOGRAM;  Surgeon:  [name], MD;  Location: [facility] CATH LAB;  Service: Cardiovascular;  Laterality: N/A;  . LOWER EXTREMITY ANGIOGRAPHY N/A 04/13/2017   Procedure: Lower Extremity Angiography;  Surgeon: [name], MD;  Location: [place] CV LAB;  Service: Cardiovascular;  Laterality: N/A;  . PERIPHERAL ATHRECTOMY Right 01/15/2015   SFA/notes 01/15/2015  . PERIPHERAL VASCULAR INTERVENTION  04/13/2017   Procedure: Peripheral Vascular Intervention;  Surgeon: [name], MD;  Location: [place] CV LAB;  Service: Cardiovascular;;   Social History   Occupational History  . disabled    Social History Main Topics  . Smoking status: Former Smoker    Packs/day: 0.00    Years: 40.00    Types: E-cigarettes    Quit date: 04/05/2017  . Smokeless tobacco: Former Systems developer    Types: Snuff  . Alcohol use No     Comment: former alcohol abuse  . Drug use: No     Comment: 04/29/2015 "last drug use was ~ 09/08/2013"  . Sexual activity: No

## 2017-05-25 ENCOUNTER — Telehealth (HOSPITAL_COMMUNITY): Payer: Self-pay

## 2017-05-25 NOTE — Telephone Encounter (Signed)
I called pt a couple times this week and left messages but no return phone call.

## 2017-05-29 ENCOUNTER — Ambulatory Visit (INDEPENDENT_AMBULATORY_CARE_PROVIDER_SITE_OTHER): Payer: Medicare Other | Admitting: Family

## 2017-05-29 ENCOUNTER — Ambulatory Visit (HOSPITAL_COMMUNITY)
Admission: RE | Admit: 2017-05-29 | Discharge: 2017-05-29 | Disposition: A | Discharge status: Home / Self Care | Payer: Medicare Other | Source: Ambulatory Visit | Attending: Cardiology | Admitting: Cardiology

## 2017-05-29 ENCOUNTER — Encounter (INDEPENDENT_AMBULATORY_CARE_PROVIDER_SITE_OTHER): Payer: Self-pay | Admitting: Family

## 2017-05-29 VITALS — Ht 64.0 in | Wt 248.0 lb

## 2017-05-29 DIAGNOSIS — I739 Peripheral vascular disease, unspecified: Secondary | ICD-10-CM

## 2017-05-29 DIAGNOSIS — T8131XD Disruption of external operation (surgical) wound, not elsewhere classified, subsequent encounter: Secondary | ICD-10-CM

## 2017-05-29 DIAGNOSIS — I70201 Unspecified atherosclerosis of native arteries of extremities, right leg: Secondary | ICD-10-CM | POA: Diagnosis not present

## 2017-05-29 DIAGNOSIS — L97929 Non-pressure chronic ulcer of unspecified part of left lower leg with unspecified severity: Principal | ICD-10-CM

## 2017-05-29 DIAGNOSIS — Z89421 Acquired absence of other right toe(s): Secondary | ICD-10-CM

## 2017-05-29 DIAGNOSIS — T8131XA Disruption of external operation (surgical) wound, not elsewhere classified, initial encounter: Secondary | ICD-10-CM | POA: Insufficient documentation

## 2017-05-29 DIAGNOSIS — L97919 Non-pressure chronic ulcer of unspecified part of right lower leg with unspecified severity: Secondary | ICD-10-CM

## 2017-05-29 DIAGNOSIS — I87333 Chronic venous hypertension (idiopathic) with ulcer and inflammation of bilateral lower extremity: Secondary | ICD-10-CM

## 2017-05-29 NOTE — Progress Notes (Signed)
Office Visit Note   Patient: [name]           Date of Birth: 1960/12/18           MRN: [id number] Visit Date: 05/29/2017              Requested by: Sandi Mariscal, [place], [place] [address] PCP: Sandi Mariscal, MD  Chief Complaint  Patient presents with  . Right Leg - Follow-up    04/28/17 Amputation of Right 2nd Ray.       HPI: The patient is 56 year old woman who presents today in follow-up for second Ray amputation right foot. She does have venous stasis swelling with a anterior ulceration. She was placed in a Dynaflex wrap with Iodosorb over the second ray amputation site at last visit. She was draining through and had a foul odor this past Friday she and her friend did remove dressing. Has been treating with dry dressing since.  Did have v vascular studies this morning. States had adequate flow to the right lower extremity.  Assessment & Plan: Visit Diagnoses:  1. 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 ([place])   2. PAD (peripheral artery disease) ([place])   3. H/O amputation of lesser toe, right ([place])   4. Disruption of external surgical wound, subsequent encounter     Plan: Apply Iodosorb plus 4 x 4's and an Haematologist. Continue doxycycline. Follow-up in office as scheduled next week with Dr. Sharol Given.  Follow-Up Instructions: Return in about 1 week (around 06/05/2017).   Ortho Exam  Patient is alert, oriented, no adenopathy, well-dressed, normal affect, normal respiratory effort. Examination patient does complain of pain in the right lower extremity she has swelling in the calf she does have a good granulating venous stasis ulcer with surrounding dermatitis anteriorly this is 4 cm in diameter. The swelling the foot is improving. The second ray amputation site has dehisced this is approximately 45 mm long is open about 3 mm wide this probes 5 mm deep. Does not probe to bone. There is no surrounding erythema and  no purulence no odor. Imaging: No results found.  Labs: Lab Results  Component Value Date   HGBA1C 11.1 (H) 10/15/2015   HGBA1C 12.8 (H) 09/05/2015   HGBA1C 9.4 (H) 06/24/2015   LABURIC 15.0 (H) 11/26/2015   REPTSTATUS 04/12/2017 FINAL 04/06/2017   GRAMSTAIN  11/24/2015    FEW WBC PRESENT, PREDOMINANTLY MONONUCLEAR RARE SQUAMOUS EPITHELIAL CELLS PRESENT NO ORGANISMS SEEN Performed at [facility]    CULT NO GROWTH 5 DAYS 04/06/2017   LABORGA ESCHERICHIA COLI (A) 04/06/2017    Orders:  No orders of the defined types were placed in this encounter.  No orders of the defined types were placed in this encounter.    Procedures: No procedures performed  Clinical Data: No additional findings.  ROS:  All other systems negative, except as noted in the HPI. Review of Systems  Constitutional: Negative for chills and fever.  Cardiovascular: Positive for leg swelling.  Skin: Positive for wound.    Objective: Vital Signs: Ht 5\' 4"  (1.626 m)   Wt 248 lb (112.5 kg)   LMP 11/27/2012   BMI 42.57 kg/m   Specialty Comments:  No specialty comments available.  PMFS History: Patient Active Problem List   Diagnosis Date Noted  . Dehiscence of external surgical wound 05/29/2017  . 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 ([place]) 05/19/2017  .  H/O amputation of lesser toe, right ([place]) 05/19/2017  . PAD (peripheral artery disease) ([place])   . Severe sepsis ([place]) 04/06/2017  . CKD (chronic kidney disease), stage III 11/24/2016  . Suspected sleep apnea 11/24/2016  . Severe obesity (BMI >= 40) ([place]) 02/24/2016  . COPD GOLD 0  02/24/2016  . Chronic systolic CHF (congestive heart failure) ([place]) 02/12/2016  . Encounter for therapeutic drug monitoring 02/10/2016  . Symptomatic bradycardia 01/12/2016  . Essential hypertension 12/22/2015  . Wheeze   . 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 Tobacco abuse 10/23/2015  . Coronary  artery disease involving native coronary artery of native heart   . DOE (dyspnea on exertion) 04/29/2015  . PAF (paroxysmal atrial fibrillation) ([place]) 01/16/2015  . Carotid arterial disease ([place]) 01/16/2015  . Claudication ([place]) 01/15/2015  . Demand ischemia ([place]) 10/29/2014  . Insomnia 02/03/2014  . S/P peripheral artery angioplasty - TurboHawk atherectomy; R SFA 09/11/2013    Class: Acute  . Leg pain, bilateral 08/19/2013  . Hypothyroidism 07/31/2013  . Cellulitis 06/13/2013  . History of cocaine abuse 06/13/2013  . Long term current use of anticoagulant therapy 05/20/2013  . Alcohol abuse   . Narcotic abuse   . Marijuana abuse   . Alcoholic cirrhosis ([place])   . DM (diabetes mellitus), type 2 with peripheral vascular complications [facility])    Past Medical History:  Diagnosis Date  . Alcohol abuse   . Alcoholic cirrhosis ([place])   . Alcoholic cirrhosis ([place])   . Anemia   . Anxiety   . B12 deficiency   . CAD (coronary artery disease)    a. cath 11/10/2014 LM nl, LAD min irregs, D1 30 ost, D2 50d, LCX 12m, OM1 80 p/m (1.5 mm vessel), OM2 64m, RCA nondom 14m-->med rx.. Demand ischemia in the setting of rapid a-fib.  . Cardiomyopathy ([place])   . Carotid artery disease ([place])    a. 01/2015 Carotid Angio: RICA 510, LICA 25E. s/p L carotid endarterectomy 02/2015.  [name] Chronic combined systolic and diastolic CHF (congestive heart failure) ([place])    a. Last echo 12/205: EF 40-45%, inferoapical/posterior HK, not technically sufficient to allow eval of LV diastolic function, normal LA in size..  . Cocaine abuse   . COPD (chronic obstructive pulmonary disease) ([place])   . Critical lower limb ischemia   . Diabetic peripheral neuropathy ([place])   . Elevated troponin    a. Chronic elevation.  [name] GERD (gastroesophageal reflux disease)   . Hyperlipemia   . Hypertension   . Hypokalemia   . Hypomagnesemia   . Hypothyroidism   . Marijuana abuse   . Narcotic abuse   . Noncompliance   . NSVT (nonsustained  ventricular tachycardia) ([place])   . Obesity   . PAF (paroxysmal atrial fibrillation) ([place])   . Paroxysmal atrial tachycardia ([place])   . Paroxysmal atrial tachycardia ([place])   . Peripheral arterial disease ([place])    a. 01/2015 Angio/PTA: LSFA 100 w/ recon @ adductor canal and 1 vessel runoff via AT, RSFA 99 (atherectomy/pta) - 1 vessel runoff via diff dzs peroneal.  . Poorly controlled type 2 diabetes mellitus ([place])   . Renal insufficiency    a. 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  [name] Symptomatic bradycardia    avoid AV blocking agent per EP  . Tobacco abuse     Family History  Problem Relation Age of Onset  . Hypertension Mother   . Diabetes Mother   . Cancer Mother        breast, ovarian, colon  .  Clotting disorder Mother   . Heart disease Mother   . Heart attack Mother   . Breast cancer Mother   . Hypertension Father   . Heart disease Father   . Emphysema Sister        smoked    Past Surgical History:  Procedure Laterality Date  . AMPUTATION TOE Right 04/28/2017   Procedure: AMPUTATION OF RIGHT SECOND RAY;  Surgeon: Newt Minion, MD;  Location: [facility];  Service: Orthopedics;  Laterality: Right;  . BALLOON ANGIOPLASTY, ARTERY Right 01/15/2015   SFA/notes 01/15/2015  . CARDIAC CATHETERIZATION    . CARDIAC CATHETERIZATION N/A 01/12/2016   Procedure: Temporary Wire;  Surgeon: Minus Breeding, MD;  Location: [place] CV LAB;  Service: Cardiovascular;  Laterality: N/A;  . CARDIOVERSION  ~ 02/2013   "twice"   . CAROTID ANGIOGRAM N/A 01/15/2015   Procedure: CAROTID ANGIOGRAM;  Surgeon: [name], MD;  Location: [facility] CATH LAB;  Service: Cardiovascular;  Laterality: N/A;  . DILATION AND CURETTAGE OF UTERUS  1988  . ENDARTERECTOMY Left 02/19/2015   Procedure: LEFT CAROTID ENDARTERECTOMY ;  Surgeon: Serafina Mitchell, MD;  Location: [facility];  Service: Vascular;  Laterality: Left;  . LEFT HEART CATHETERIZATION WITH CORONARY ANGIOGRAM N/A 10/31/2014   Procedure: LEFT HEART CATHETERIZATION WITH CORONARY  ANGIOGRAM;  Surgeon: Burnell Blanks, MD;  Location: [facility] CATH LAB;  Service: Cardiovascular;  Laterality: N/A;  . LOWER EXTREMITY ANGIOGRAM N/A 09/10/2013   Procedure: LOWER EXTREMITY ANGIOGRAM;  Surgeon: [name], MD;  Location: [facility] CATH LAB;  Service: Cardiovascular;  Laterality: N/A;  . LOWER EXTREMITY ANGIOGRAM N/A 01/15/2015   Procedure: LOWER EXTREMITY ANGIOGRAM;  Surgeon: [name], MD;  Location: [facility] CATH LAB;  Service: Cardiovascular;  Laterality: N/A;  . LOWER EXTREMITY ANGIOGRAPHY N/A 04/13/2017   Procedure: Lower Extremity Angiography;  Surgeon: [name], MD;  Location: [place] CV LAB;  Service: Cardiovascular;  Laterality: N/A;  . PERIPHERAL ATHRECTOMY Right 01/15/2015   SFA/notes 01/15/2015  . PERIPHERAL VASCULAR INTERVENTION  04/13/2017   Procedure: Peripheral Vascular Intervention;  Surgeon: [name], MD;  Location: [place] CV LAB;  Service: Cardiovascular;;   Social History   Occupational History  . disabled    Social History Main Topics  . Smoking status: Former Smoker    Packs/day: 0.00    Years: 40.00    Types: E-cigarettes    Quit date: 04/05/2017  . Smokeless tobacco: Former Systems developer    Types: Snuff  . Alcohol use No     Comment: former alcohol abuse  . Drug use: No     Comment: 04/29/2015 "last drug use was ~ 09/08/2013"  . Sexual activity: No

## 2017-05-30 ENCOUNTER — Telehealth: Payer: Self-pay | Admitting: *Deleted

## 2017-05-30 NOTE — Telephone Encounter (Signed)
Left message to call back for results:  Notes recorded by [name], MD on 05/30/2017 at 10:59 AM EDT Improved right ABI status post directional atherectomy and drug-eluting balloon angioplasty. Repeat 6 months

## 2017-05-31 ENCOUNTER — Other Ambulatory Visit: Payer: Self-pay | Admitting: Internal Medicine

## 2017-05-31 ENCOUNTER — Other Ambulatory Visit (HOSPITAL_COMMUNITY): Payer: Self-pay

## 2017-05-31 NOTE — Progress Notes (Signed)
Paramedicine Encounter    Patient ID: [name], female    DOB: 1960-12-31, 56 y.o.   MRN: [id number]   Patient Care Team: Sandi Mariscal, MD as PCP - General (Internal Medicine) [name], MD as Consulting Physician (Cardiology) [name], MD as Consulting Physician (Pulmonary Disease)  Patient Active Problem List   Diagnosis Date Noted  . Dehiscence of external surgical wound 05/29/2017  . 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 ([place]) 05/19/2017  . H/O amputation of lesser toe, right ([place]) 05/19/2017  . PAD (peripheral artery disease) ([place])   . Severe sepsis ([place]) 04/06/2017  . CKD (chronic kidney disease), stage III 11/24/2016  . Suspected sleep apnea 11/24/2016  . Severe obesity (BMI >= 40) ([place]) 02/24/2016  . COPD GOLD 0  02/24/2016  . Chronic systolic CHF (congestive heart failure) ([place]) 02/12/2016  . Encounter for therapeutic drug monitoring 02/10/2016  . Symptomatic bradycardia 01/12/2016  . Essential hypertension 12/22/2015  . Wheeze   . 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 Tobacco abuse 10/23/2015  . Coronary artery disease involving native coronary artery of native heart   . DOE (dyspnea on exertion) 04/29/2015  . PAF (paroxysmal atrial fibrillation) ([place]) 01/16/2015  . Carotid arterial disease ([place]) 01/16/2015  . Claudication ([place]) 01/15/2015  . Demand ischemia ([place]) 10/29/2014  . Insomnia 02/03/2014  . S/P peripheral artery angioplasty - TurboHawk atherectomy; R SFA 09/11/2013    Class: Acute  . Leg pain, bilateral 08/19/2013  . Hypothyroidism 07/31/2013  . Cellulitis 06/13/2013  . History of cocaine abuse 06/13/2013  . Long term current use of anticoagulant therapy 05/20/2013  . Alcohol abuse   . Narcotic abuse   . Marijuana abuse   . Alcoholic cirrhosis ([place])   . DM (diabetes mellitus), type 2 with peripheral vascular complications ([name])     Current Outpatient Prescriptions:  .   albuterol (PROVENTIL HFA;VENTOLIN HFA) 108 (90 Base) MCG/ACT inhaler, Inhale 2 puffs into the lungs every 6 (six) hours as needed for wheezing or shortness of breath., Disp: 1 each, Rfl: 3 .  amiodarone (PACERONE) 100 MG tablet, Take 1 tablet (100 mg total) by mouth daily., Disp: 30 tablet, Rfl: 3 .  amLODipine (NORVASC) 10 MG tablet, TAKE ONE TABLET BY MOUTH ONCE DAILY, Disp: 30 tablet, Rfl: 3 .  apixaban (ELIQUIS) 5 MG TABS tablet, Take 1 tablet (5 mg total) by mouth 2 (two) times daily., Disp: 60 tablet, Rfl: 0 .  atorvastatin (LIPITOR) 40 MG tablet, TAKE 1 TABLET BY MOUTH ONCE EVERY EVENING, Disp: 90 tablet, Rfl: 3 .  BELBUCA 75 MCG FILM, Place 1 Film inside cheek 2 (two) times daily. , Disp: , Rfl: 0 .  bisoprolol (ZEBETA) 5 MG tablet, TAKE ONE-HALF ( 2.5MG  TOTAL) TABLET BY MOUTH DAILY, Disp: 15 tablet, Rfl: 3 .  clopidogrel (PLAVIX) 75 MG tablet, Take 1 tablet (75 mg total) by mouth daily with breakfast., Disp: 30 tablet, Rfl: 0 .  cyanocobalamin 1000 MCG tablet, Take 1 tablet (1,000 mcg total) by mouth daily., Disp: 30 tablet, Rfl: 10 .  doxycycline (VIBRA-TABS) 100 MG tablet, Take 1 tablet (100 mg total) by mouth 2 (two) times daily., Disp: 60 tablet, Rfl: 0 .  doxycycline (VIBRAMYCIN) 100 MG capsule, Take 100 mg by mouth 2 (two) times daily., Disp: , Rfl:  .  folic acid (FOLVITE) 1 MG tablet, TAKE 1 TABLET BY MOUTH EVERY DAY, Disp: 90 tablet, Rfl: 1 .  insulin aspart (NOVOLOG FLEXPEN) 100  UNIT/ML FlexPen, Inject 25 Units into the skin 3 (three) times daily with meals., Disp: 15 mL, Rfl: 11 .  Insulin Glargine (LANTUS SOLOSTAR) 100 UNIT/ML Solostar Pen, Inject 48 Units into the skin daily at 10 pm., Disp: 15 mL, Rfl: 11 .  levothyroxine (SYNTHROID, LEVOTHROID) 50 MCG tablet, Take 1 tablet (50 mcg total) by mouth daily., Disp: 30 tablet, Rfl: 2 .  losartan (COZAAR) 25 MG tablet, TAKE ONE TABLET BY MOUTH ONCE DAILY, Disp: 90 tablet, Rfl: 3 .  metolazone (ZAROXOLYN) 2.5 MG tablet, TAKE 1 TABLET  BY MOUTH ONCE A WEEK ON THURSDAY. MAY TAKE AN ADDITIONAL TABLET AS DIRECTED BY HF CLINIC., Disp: 5 tablet, Rfl: 4 .  Multiple Vitamin (MULTIVITAMIN) tablet, Take 1 tablet by mouth daily., Disp: , Rfl:  .  oxyCODONE-acetaminophen (ROXICET) 5-325 MG tablet, Take 1 tablet by mouth every 8 (eight) hours as needed for severe pain., Disp: 30 tablet, Rfl: 0 .  potassium chloride SA (K-DUR,KLOR-CON) 20 MEQ tablet, Take 1 tablet (20 mEq total) by mouth daily. Take an additional 20 meq (1 tab) on Thursday with metolazone, Disp: 35 tablet, Rfl: 3 .  spironolactone (ALDACTONE) 25 MG tablet, Take 1 tablet (25 mg total) by mouth daily., Disp: 90 tablet, Rfl: 3 .  SYMBICORT 160-4.5 MCG/ACT inhaler, Inhale 2 puffs into the lungs 2 (two) times daily., Disp: , Rfl: 8 .  torsemide (DEMADEX) 20 MG tablet, Take 2 tablets (40 mg total) by mouth 2 (two) times daily., Disp: 360 tablet, Rfl: 3 .  cholecalciferol (VITAMIN D) 1000 units tablet, Take 1,000 Units by mouth daily., Disp: , Rfl:  Allergies  Allergen Reactions  . Gabapentin Nausea And Vomiting    POSSIBLE SHAKING? TAKING MED CURRENTLY     Social History   Social History  . Marital status: Single    Spouse name: N/A  . Number of children: N/A  . Years of education: N/A   Occupational History  . disabled    Social History Main Topics  . Smoking status: Former Smoker    Packs/day: 0.00    Years: 40.00    Types: E-cigarettes    Quit date: 04/05/2017  . Smokeless tobacco: Former Systems developer    Types: Snuff  . Alcohol use No     Comment: former alcohol abuse  . Drug use: No     Comment: 04/29/2015 "last drug use was ~ 09/08/2013"  . Sexual activity: No   Other Topics Concern  . Not on file   Social History Narrative   Lives in [place], in [place] with sister.  They are looking to move but don't have a place to go yet.      Physical Exam      Future Appointments Date Time Provider [place]  06/05/2017 1:15 PM Newt Minion, MD [facility] None   06/15/2017 11:20 AM Larey Dresser, MD [facility] None  08/18/2017 2:15 PM [name], MD [facility]   BP 136/72   Pulse 76   Resp 15   Wt 238 lb (108 kg)   LMP 11/27/2012   SpO2 98%   BMI 40.85 kg/m  Weight yesterday-240 Last visit weight-238 CBG PTA-241  Pt has been able to fill her own pill box past couple weeks, I was unable to reach her last week, she states she feels depressed and tired of hurting. meds verified, Med box was correct with her meds--she is missing the metolazone.  She is struggling with the healing process of her foot, she feels  like her left foot is beginning to cause her problems, she has less pain in her legs now but still pain to her foot.  Her sister had placed 4 tablets in todays dosage--pt isnt sure if she has been taking 4 in am and her 2 in the pm or not---she states she goes to PCP today and I advised her to suggest to him to check her kidney function since she is going there today and let him know she may have been taking extra fluid pills. Pharmacy is going to send out meds. Pt denies any sob, no dizziness.  Pt is independent and has the resources she needs to manage herself and get her to appointments. She is  agreeable to be d/c from paramedicine and feels good about that. Advised them to contact me or clinic if she feels she needs to have paramedicine back in the home at a later date and needs that additional support in the home.   ACTION: Home visit completed  Marylouise Stacks, EMT-Paramedic 05/31/17

## 2017-06-05 ENCOUNTER — Ambulatory Visit (INDEPENDENT_AMBULATORY_CARE_PROVIDER_SITE_OTHER): Payer: Medicare Other | Admitting: Orthopedic Surgery

## 2017-06-05 ENCOUNTER — Telehealth (HOSPITAL_COMMUNITY): Payer: Self-pay | Admitting: Surgery

## 2017-06-05 ENCOUNTER — Encounter (INDEPENDENT_AMBULATORY_CARE_PROVIDER_SITE_OTHER): Payer: Self-pay | Admitting: Orthopedic Surgery

## 2017-06-05 VITALS — Ht 64.0 in | Wt 238.0 lb

## 2017-06-05 DIAGNOSIS — Z89431 Acquired absence of right foot: Secondary | ICD-10-CM | POA: Insufficient documentation

## 2017-06-05 DIAGNOSIS — L97919 Non-pressure chronic ulcer of unspecified part of right lower leg with unspecified severity: Secondary | ICD-10-CM

## 2017-06-05 DIAGNOSIS — L97929 Non-pressure chronic ulcer of unspecified part of left lower leg with unspecified severity: Principal | ICD-10-CM

## 2017-06-05 DIAGNOSIS — I87333 Chronic venous hypertension (idiopathic) with ulcer and inflammation of bilateral lower extremity: Secondary | ICD-10-CM

## 2017-06-05 DIAGNOSIS — Z89421 Acquired absence of other right toe(s): Secondary | ICD-10-CM

## 2017-06-05 NOTE — Progress Notes (Signed)
Office Visit Note   Patient: [name]           Date of Birth: Jul 30, 1961           MRN: [id number] Visit Date: 06/05/2017              Requested by: Sandi Mariscal, [place], [place] [address] PCP: Sandi Mariscal, MD  Chief Complaint  Patient presents with  . Right Foot - Routine Post Op    04/28/17 right foot 2nd ray amputation       HPI: Patient presents for follow-up status post amputation right foot second toe with venous insufficiency swelling ulceration. She is currently on doxycycline her wound was being wrapped with Iodosorb dressing and a Dynaflex compression wrap.  Assessment & Plan: Visit Diagnoses:  1. 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)   2. Status post amputation of toe of right foot ([place])     Plan: Patient will need frequent daily dressing changes. We will have her wash the foot and soap and water daily and apply antibiotic ointment and gauze pack the wound open with gauze and wear her medical compression sock around-the-clock.  Follow-Up Instructions: Return in about 2 weeks (around 06/19/2017).   Ortho Exam  Patient is alert, oriented, no adenopathy, well-dressed, normal affect, normal respiratory effort. Examination the dermatitis and ulceration to the right leg is improving with the compression wraps. The wound was debrided of fibrinous exudative tissue back to healthy viable granulation tissue approximately 50% of the wound was granulation tissue the wound is 1 x 3 cm and 5 mm deep. There is no exposed bone there is no purulence there is odor most likely due to the maceration.  Imaging: No results found.  Labs: Lab Results  Component Value Date   HGBA1C 11.1 (H) 10/15/2015   HGBA1C 12.8 (H) 09/05/2015   HGBA1C 9.4 (H) 06/24/2015   LABURIC 15.0 (H) 11/26/2015   REPTSTATUS 04/12/2017 FINAL 04/06/2017   GRAMSTAIN  11/24/2015    FEW WBC PRESENT, PREDOMINANTLY MONONUCLEAR RARE  SQUAMOUS EPITHELIAL CELLS PRESENT NO ORGANISMS SEEN Performed at [facility]    CULT NO GROWTH 5 DAYS 04/06/2017   LABORGA ESCHERICHIA COLI (A) 04/06/2017    Orders:  No orders of the defined types were placed in this encounter.  No orders of the defined types were placed in this encounter.    Procedures: No procedures performed  Clinical Data: No additional findings.  ROS:  All other systems negative, except as noted in the HPI. Review of Systems  Objective: Vital Signs: Ht 5\' 4"  (1.626 m)   Wt 238 lb (108 kg)   LMP 11/27/2012   BMI 40.85 kg/m   Specialty Comments:  No specialty comments available.  PMFS History: Patient Active Problem List   Diagnosis Date Noted  . Status post amputation of toe of right foot ([place]) 06/05/2017  . Dehiscence of external surgical wound 05/29/2017  . 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 H/O amputation of lesser toe, right ([place]) 05/19/2017  . PAD (peripheral artery disease) ([place])   . Severe sepsis ([place]) 04/06/2017  . CKD (chronic kidney disease), stage III 11/24/2016  . Suspected sleep apnea 11/24/2016  . Severe obesity (BMI >= 40) ([place]) 02/24/2016  . COPD GOLD 0  02/24/2016  . Chronic systolic CHF (congestive heart failure) ([place]) 02/12/2016  . Encounter for therapeutic drug monitoring 02/10/2016  .  Symptomatic bradycardia 01/12/2016  . Essential hypertension 12/22/2015  . Wheeze   . 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 Tobacco abuse 10/23/2015  . Coronary artery disease involving native coronary artery of native heart   . DOE (dyspnea on exertion) 04/29/2015  . PAF (paroxysmal atrial fibrillation) ([place]) 01/16/2015  . Carotid arterial disease ([place]) 01/16/2015  . Claudication ([place]) 01/15/2015  . Demand ischemia ([place]) 10/29/2014  . Insomnia 02/03/2014  . S/P peripheral artery angioplasty - TurboHawk atherectomy; R SFA 09/11/2013    Class:  Acute  . Leg pain, bilateral 08/19/2013  . Hypothyroidism 07/31/2013  . Cellulitis 06/13/2013  . History of cocaine abuse 06/13/2013  . Long term current use of anticoagulant therapy 05/20/2013  . Alcohol abuse   . Narcotic abuse   . Marijuana abuse   . Alcoholic cirrhosis ([place])   . DM (diabetes mellitus), type 2 with peripheral vascular complications [facility])    Past Medical History:  Diagnosis Date  . Alcohol abuse   . Alcoholic cirrhosis ([place])   . Alcoholic cirrhosis ([place])   . Anemia   . Anxiety   . B12 deficiency   . CAD (coronary artery disease)    a. cath 11/10/2014 LM nl, LAD min irregs, D1 30 ost, D2 50d, LCX 50m, OM1 80 p/m (1.5 mm vessel), OM2 48m, RCA nondom 41m-->med rx.. Demand ischemia in the setting of rapid a-fib.  . Cardiomyopathy ([place])   . Carotid artery disease ([place])    a. 01/2015 Carotid Angio: RICA 419, LICA 62I. s/p L carotid endarterectomy 02/2015.  [name] Chronic combined systolic and diastolic CHF (congestive heart failure) ([place])    a. Last echo 12/205: EF 40-45%, inferoapical/posterior HK, not technically sufficient to allow eval of LV diastolic function, normal LA in size..  . Cocaine abuse   . COPD (chronic obstructive pulmonary disease) ([place])   . Critical lower limb ischemia   . Diabetic peripheral neuropathy ([place])   . Elevated troponin    a. Chronic elevation.  [name] GERD (gastroesophageal reflux disease)   . Hyperlipemia   . Hypertension   . Hypokalemia   . Hypomagnesemia   . Hypothyroidism   . Marijuana abuse   . Narcotic abuse   . Noncompliance   . NSVT (nonsustained ventricular tachycardia) ([place])   . Obesity   . PAF (paroxysmal atrial fibrillation) ([place])   . Paroxysmal atrial tachycardia ([place])   . Paroxysmal atrial tachycardia ([place])   . Peripheral arterial disease ([place])    a. 01/2015 Angio/PTA: LSFA 100 w/ recon @ adductor canal and 1 vessel runoff via AT, RSFA 99 (atherectomy/pta) - 1 vessel runoff via diff dzs peroneal.  . Poorly controlled type 2  diabetes mellitus ([place])   . Renal insufficiency    a. 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  [name] Symptomatic bradycardia    avoid AV blocking agent per EP  . Tobacco abuse     Family History  Problem Relation Age of Onset  . Hypertension Mother   . Diabetes Mother   . Cancer Mother        breast, ovarian, colon  . Clotting disorder Mother   . Heart disease Mother   . Heart attack Mother   . Breast cancer Mother   . Hypertension Father   . Heart disease Father   . Emphysema Sister        smoked    Past Surgical History:  Procedure Laterality Date  . AMPUTATION TOE Right 04/28/2017   Procedure: AMPUTATION OF RIGHT SECOND RAY;  Surgeon:  Newt Minion, MD;  Location: [facility];  Service: Orthopedics;  Laterality: Right;  . BALLOON ANGIOPLASTY, ARTERY Right 01/15/2015   SFA/notes 01/15/2015  . CARDIAC CATHETERIZATION    . CARDIAC CATHETERIZATION N/A 01/12/2016   Procedure: Temporary Wire;  Surgeon: Minus Breeding, MD;  Location: [facility] LAB;  Service: Cardiovascular;  Laterality: N/A;  . CARDIOVERSION  ~ 02/2013   "twice"   . CAROTID ANGIOGRAM N/A 01/15/2015   Procedure: CAROTID ANGIOGRAM;  Surgeon: [name], MD;  Location: [facility] CATH LAB;  Service: Cardiovascular;  Laterality: N/A;  . DILATION AND CURETTAGE OF UTERUS  1988  . ENDARTERECTOMY Left 02/19/2015   Procedure: LEFT CAROTID ENDARTERECTOMY ;  Surgeon: Serafina Mitchell, MD;  Location: [facility];  Service: Vascular;  Laterality: Left;  . LEFT HEART CATHETERIZATION WITH CORONARY ANGIOGRAM N/A 10/31/2014   Procedure: LEFT HEART CATHETERIZATION WITH CORONARY ANGIOGRAM;  Surgeon: Burnell Blanks, MD;  Location: [facility] CATH LAB;  Service: Cardiovascular;  Laterality: N/A;  . LOWER EXTREMITY ANGIOGRAM N/A 09/10/2013   Procedure: LOWER EXTREMITY ANGIOGRAM;  Surgeon: [name], MD;  Location: [facility] CATH LAB;  Service: Cardiovascular;  Laterality: N/A;  . LOWER EXTREMITY ANGIOGRAM N/A 01/15/2015   Procedure: LOWER EXTREMITY ANGIOGRAM;  Surgeon:  [name], MD;  Location: [facility] CATH LAB;  Service: Cardiovascular;  Laterality: N/A;  . LOWER EXTREMITY ANGIOGRAPHY N/A 04/13/2017   Procedure: Lower Extremity Angiography;  Surgeon: [name], MD;  Location: [facility] LAB;  Service: Cardiovascular;  Laterality: N/A;  . PERIPHERAL ATHRECTOMY Right 01/15/2015   SFA/notes 01/15/2015  . PERIPHERAL VASCULAR INTERVENTION  04/13/2017   Procedure: Peripheral Vascular Intervention;  Surgeon: [name], MD;  Location: [facility] CV LAB;  Service: Cardiovascular;;   Social History   Occupational History  . disabled    Social History Main Topics  . Smoking status: Former Smoker    Packs/day: 0.00    Years: 40.00    Types: E-cigarettes    Quit date: 04/05/2017  . Smokeless tobacco: Former Systems developer    Types: Snuff  . Alcohol use No     Comment: former alcohol abuse  . Drug use: No     Comment: 04/29/2015 "last drug use was ~ 09/08/2013"  . Sexual activity: No

## 2017-06-05 NOTE — Telephone Encounter (Signed)
Patient is discharged from HF Paramedicine Program.  She has completed the program and is independently managing her HF at home.  She will continue to follow in the AHF Clinic and has been encouraged to reach out to the AHF Clinic with any questions or concerns regarding her HF.

## 2017-06-07 ENCOUNTER — Other Ambulatory Visit: Payer: Self-pay | Admitting: Cardiovascular Disease

## 2017-06-07 DIAGNOSIS — I739 Peripheral vascular disease, unspecified: Secondary | ICD-10-CM

## 2017-06-13 ENCOUNTER — Inpatient Hospital Stay (HOSPITAL_COMMUNITY): Payer: Medicare Other

## 2017-06-13 ENCOUNTER — Emergency Department (HOSPITAL_COMMUNITY): Payer: Medicare Other

## 2017-06-13 ENCOUNTER — Inpatient Hospital Stay (HOSPITAL_COMMUNITY)
Admission: EM | Admit: 2017-06-13 | Discharge: 2017-06-16 | DRG: 856 | Disposition: A | Discharge status: Skilled Nursing Facility | Payer: Medicare Other | Attending: Internal Medicine | Admitting: Internal Medicine

## 2017-06-13 ENCOUNTER — Encounter (HOSPITAL_COMMUNITY): Payer: Self-pay | Admitting: *Deleted

## 2017-06-13 ENCOUNTER — Other Ambulatory Visit (INDEPENDENT_AMBULATORY_CARE_PROVIDER_SITE_OTHER): Payer: Self-pay | Admitting: Orthopedic Surgery

## 2017-06-13 DIAGNOSIS — F1410 Cocaine abuse, uncomplicated: Secondary | ICD-10-CM | POA: Diagnosis present

## 2017-06-13 DIAGNOSIS — S92321A Displaced fracture of second metatarsal bone, right foot, initial encounter for closed fracture: Secondary | ICD-10-CM | POA: Diagnosis present

## 2017-06-13 DIAGNOSIS — E1165 Type 2 diabetes mellitus with hyperglycemia: Secondary | ICD-10-CM | POA: Diagnosis present

## 2017-06-13 DIAGNOSIS — Z89421 Acquired absence of other right toe(s): Secondary | ICD-10-CM

## 2017-06-13 DIAGNOSIS — J449 Chronic obstructive pulmonary disease, unspecified: Secondary | ICD-10-CM | POA: Diagnosis present

## 2017-06-13 DIAGNOSIS — I130 Hypertensive heart and chronic kidney disease with heart failure and stage 1 through stage 4 chronic kidney disease, or unspecified chronic kidney disease: Secondary | ICD-10-CM | POA: Diagnosis present

## 2017-06-13 DIAGNOSIS — I2510 Atherosclerotic heart disease of native coronary artery without angina pectoris: Secondary | ICD-10-CM | POA: Diagnosis present

## 2017-06-13 DIAGNOSIS — Z6841 Body Mass Index (BMI) 40.0 and over, adult: Secondary | ICD-10-CM | POA: Diagnosis not present

## 2017-06-13 DIAGNOSIS — I83023 Varicose veins of left lower extremity with ulcer of ankle: Secondary | ICD-10-CM | POA: Diagnosis present

## 2017-06-13 DIAGNOSIS — I480 Paroxysmal atrial fibrillation: Secondary | ICD-10-CM | POA: Diagnosis present

## 2017-06-13 DIAGNOSIS — E785 Hyperlipidemia, unspecified: Secondary | ICD-10-CM | POA: Diagnosis present

## 2017-06-13 DIAGNOSIS — N179 Acute kidney failure, unspecified: Secondary | ICD-10-CM | POA: Diagnosis present

## 2017-06-13 DIAGNOSIS — M86171 Other acute osteomyelitis, right ankle and foot: Secondary | ICD-10-CM | POA: Diagnosis present

## 2017-06-13 DIAGNOSIS — L03115 Cellulitis of right lower limb: Secondary | ICD-10-CM | POA: Diagnosis present

## 2017-06-13 DIAGNOSIS — E1142 Type 2 diabetes mellitus with diabetic polyneuropathy: Secondary | ICD-10-CM | POA: Diagnosis present

## 2017-06-13 DIAGNOSIS — Z833 Family history of diabetes mellitus: Secondary | ICD-10-CM

## 2017-06-13 DIAGNOSIS — E1151 Type 2 diabetes mellitus with diabetic peripheral angiopathy without gangrene: Secondary | ICD-10-CM | POA: Diagnosis present

## 2017-06-13 DIAGNOSIS — T814XXA Infection following a procedure, initial encounter: Principal | ICD-10-CM | POA: Diagnosis present

## 2017-06-13 DIAGNOSIS — Z87891 Personal history of nicotine dependence: Secondary | ICD-10-CM

## 2017-06-13 DIAGNOSIS — F1010 Alcohol abuse, uncomplicated: Secondary | ICD-10-CM | POA: Diagnosis present

## 2017-06-13 DIAGNOSIS — R6510 Systemic inflammatory response syndrome (SIRS) of non-infectious origin without acute organ dysfunction: Secondary | ICD-10-CM | POA: Diagnosis present

## 2017-06-13 DIAGNOSIS — F1210 Cannabis abuse, uncomplicated: Secondary | ICD-10-CM | POA: Diagnosis present

## 2017-06-13 DIAGNOSIS — I429 Cardiomyopathy, unspecified: Secondary | ICD-10-CM | POA: Diagnosis present

## 2017-06-13 DIAGNOSIS — F1110 Opioid abuse, uncomplicated: Secondary | ICD-10-CM | POA: Diagnosis present

## 2017-06-13 DIAGNOSIS — M868X7 Other osteomyelitis, ankle and foot: Secondary | ICD-10-CM

## 2017-06-13 DIAGNOSIS — Y838 Other surgical procedures as the cause of abnormal reaction of the patient, or of later complication, without mention of misadventure at the time of the procedure: Secondary | ICD-10-CM | POA: Diagnosis present

## 2017-06-13 DIAGNOSIS — E1169 Type 2 diabetes mellitus with other specified complication: Secondary | ICD-10-CM | POA: Diagnosis present

## 2017-06-13 DIAGNOSIS — I5042 Chronic combined systolic (congestive) and diastolic (congestive) heart failure: Secondary | ICD-10-CM | POA: Diagnosis present

## 2017-06-13 DIAGNOSIS — Z89431 Acquired absence of right foot: Secondary | ICD-10-CM

## 2017-06-13 DIAGNOSIS — I10 Essential (primary) hypertension: Secondary | ICD-10-CM | POA: Diagnosis present

## 2017-06-13 DIAGNOSIS — R6521 Severe sepsis with septic shock: Secondary | ICD-10-CM | POA: Diagnosis present

## 2017-06-13 DIAGNOSIS — F419 Anxiety disorder, unspecified: Secondary | ICD-10-CM | POA: Diagnosis present

## 2017-06-13 DIAGNOSIS — N183 Chronic kidney disease, stage 3 unspecified: Secondary | ICD-10-CM | POA: Diagnosis present

## 2017-06-13 DIAGNOSIS — E1122 Type 2 diabetes mellitus with diabetic chronic kidney disease: Secondary | ICD-10-CM | POA: Diagnosis present

## 2017-06-13 DIAGNOSIS — Z888 Allergy status to other drugs, medicaments and biological substances status: Secondary | ICD-10-CM

## 2017-06-13 DIAGNOSIS — Z79899 Other long term (current) drug therapy: Secondary | ICD-10-CM

## 2017-06-13 DIAGNOSIS — E538 Deficiency of other specified B group vitamins: Secondary | ICD-10-CM | POA: Diagnosis present

## 2017-06-13 DIAGNOSIS — K219 Gastro-esophageal reflux disease without esophagitis: Secondary | ICD-10-CM | POA: Diagnosis present

## 2017-06-13 DIAGNOSIS — Z7902 Long term (current) use of antithrombotics/antiplatelets: Secondary | ICD-10-CM

## 2017-06-13 DIAGNOSIS — M869 Osteomyelitis, unspecified: Secondary | ICD-10-CM

## 2017-06-13 DIAGNOSIS — K7030 Alcoholic cirrhosis of liver without ascites: Secondary | ICD-10-CM | POA: Diagnosis present

## 2017-06-13 DIAGNOSIS — T8130XA Disruption of wound, unspecified, initial encounter: Secondary | ICD-10-CM | POA: Diagnosis present

## 2017-06-13 DIAGNOSIS — Z7901 Long term (current) use of anticoagulants: Secondary | ICD-10-CM

## 2017-06-13 DIAGNOSIS — E1152 Type 2 diabetes mellitus with diabetic peripheral angiopathy with gangrene: Secondary | ICD-10-CM | POA: Diagnosis present

## 2017-06-13 DIAGNOSIS — E119 Type 2 diabetes mellitus without complications: Secondary | ICD-10-CM | POA: Diagnosis present

## 2017-06-13 DIAGNOSIS — Z794 Long term (current) use of insulin: Secondary | ICD-10-CM

## 2017-06-13 DIAGNOSIS — A419 Sepsis, unspecified organism: Secondary | ICD-10-CM | POA: Diagnosis present

## 2017-06-13 DIAGNOSIS — E039 Hypothyroidism, unspecified: Secondary | ICD-10-CM | POA: Diagnosis present

## 2017-06-13 DIAGNOSIS — Z8249 Family history of ischemic heart disease and other diseases of the circulatory system: Secondary | ICD-10-CM

## 2017-06-13 DIAGNOSIS — I152 Hypertension secondary to endocrine disorders: Secondary | ICD-10-CM | POA: Diagnosis present

## 2017-06-13 HISTORY — DX: Major depressive disorder, single episode, unspecified: F32.9

## 2017-06-13 HISTORY — DX: Depression, unspecified: F32.A

## 2017-06-13 LAB — CBC WITH DIFFERENTIAL/PLATELET
BASOS PCT: 0 %
Basophils Absolute: 0 10*3/uL (ref 0.0–0.1)
Eosinophils Absolute: 0 10*3/uL (ref 0.0–0.7)
Eosinophils Relative: 0 %
HEMATOCRIT: 32.5 % — AB (ref 36.0–46.0)
HEMOGLOBIN: 10.4 g/dL — AB (ref 12.0–15.0)
LYMPHS ABS: 1.5 10*3/uL (ref 0.7–4.0)
LYMPHS PCT: 7 %
MCH: 28.7 pg (ref 26.0–34.0)
MCHC: 32 g/dL (ref 30.0–36.0)
MCV: 89.8 fL (ref 78.0–100.0)
MONO ABS: 0.9 10*3/uL (ref 0.1–1.0)
MONOS PCT: 4 %
NEUTROS ABS: 20.8 10*3/uL — AB (ref 1.7–7.7)
NEUTROS PCT: 89 %
Platelets: 315 10*3/uL (ref 150–400)
RBC: 3.62 MIL/uL — ABNORMAL LOW (ref 3.87–5.11)
RDW: 14.9 % (ref 11.5–15.5)
WBC: 23.2 10*3/uL — ABNORMAL HIGH (ref 4.0–10.5)

## 2017-06-13 LAB — I-STAT TROPONIN, ED: Troponin i, poc: 0 ng/mL (ref 0.00–0.08)

## 2017-06-13 LAB — COMPREHENSIVE METABOLIC PANEL
ALBUMIN: 3 g/dL — AB (ref 3.5–5.0)
ALK PHOS: 137 U/L — AB (ref 38–126)
ALT: 36 U/L (ref 14–54)
ANION GAP: 14 (ref 5–15)
AST: 42 U/L — ABNORMAL HIGH (ref 15–41)
BUN: 56 mg/dL — ABNORMAL HIGH (ref 6–20)
CHLORIDE: 94 mmol/L — AB (ref 101–111)
CO2: 26 mmol/L (ref 22–32)
Calcium: 9.1 mg/dL (ref 8.9–10.3)
Creatinine, Ser: 1.87 mg/dL — ABNORMAL HIGH (ref 0.44–1.00)
GFR calc non Af Amer: 29 mL/min — ABNORMAL LOW (ref >60)
GFR, EST AFRICAN AMERICAN: 34 mL/min — AB (ref >60)
GLUCOSE: 332 mg/dL — AB (ref 65–99)
Potassium: 4.7 mmol/L (ref 3.5–5.1)
SODIUM: 134 mmol/L — AB (ref 135–145)
Total Bilirubin: 0.5 mg/dL (ref 0.3–1.2)
Total Protein: 7.2 g/dL (ref 6.5–8.1)

## 2017-06-13 LAB — URINALYSIS, ROUTINE W REFLEX MICROSCOPIC
BILIRUBIN URINE: NEGATIVE
GLUCOSE, UA: 50 mg/dL — AB
Hgb urine dipstick: NEGATIVE
KETONES UR: NEGATIVE mg/dL
Leukocytes, UA: NEGATIVE
NITRITE: NEGATIVE
PH: 5 (ref 5.0–8.0)
Protein, ur: NEGATIVE mg/dL
SPECIFIC GRAVITY, URINE: 1.012 (ref 1.005–1.030)

## 2017-06-13 LAB — CBG MONITORING, ED
GLUCOSE-CAPILLARY: 360 mg/dL — AB (ref 65–99)
Glucose-Capillary: 298 mg/dL — ABNORMAL HIGH (ref 65–99)
Glucose-Capillary: 315 mg/dL — ABNORMAL HIGH (ref 65–99)

## 2017-06-13 LAB — GLUCOSE, CAPILLARY
GLUCOSE-CAPILLARY: 308 mg/dL — AB (ref 65–99)
Glucose-Capillary: 285 mg/dL — ABNORMAL HIGH (ref 65–99)

## 2017-06-13 LAB — I-STAT CG4 LACTIC ACID, ED
LACTIC ACID, VENOUS: 2.74 mmol/L — AB (ref 0.5–1.9)
Lactic Acid, Venous: 4.73 mmol/L (ref 0.5–1.9)

## 2017-06-13 LAB — POC OCCULT BLOOD, ED: Fecal Occult Bld: POSITIVE — AB

## 2017-06-13 LAB — MRSA PCR SCREENING: MRSA BY PCR: INVALID — AB

## 2017-06-13 MED ORDER — PIPERACILLIN-TAZOBACTAM 3.375 G IVPB
3.3750 g | Freq: Three times a day (TID) | INTRAVENOUS | Status: DC
  Administered 2017-06-13 – 2017-06-15 (×6): 3.375 g via INTRAVENOUS
  Filled 2017-06-13 (×8): qty 50

## 2017-06-13 MED ORDER — ALBUTEROL SULFATE (2.5 MG/3ML) 0.083% IN NEBU: 3.0000 mL | INHALATION_SOLUTION | Freq: Four times a day (QID) | RESPIRATORY_TRACT | Status: DC | PRN

## 2017-06-13 MED ORDER — ONDANSETRON HCL 4 MG PO TABS: 4.0000 mg | ORAL_TABLET | Freq: Four times a day (QID) | ORAL | Status: DC | PRN

## 2017-06-13 MED ORDER — OXYCODONE HCL 5 MG PO TABS
5.0000 mg | ORAL_TABLET | Freq: Once | ORAL | Status: AC
  Administered 2017-06-14: 5 mg via ORAL
  Filled 2017-06-13 (×2): qty 1

## 2017-06-13 MED ORDER — MOMETASONE FURO-FORMOTEROL FUM 200-5 MCG/ACT IN AERO
2.0000 | INHALATION_SPRAY | Freq: Two times a day (BID) | RESPIRATORY_TRACT | Status: DC
  Administered 2017-06-14 – 2017-06-16 (×4): 2 via RESPIRATORY_TRACT
  Filled 2017-06-13: qty 8.8

## 2017-06-13 MED ORDER — SODIUM CHLORIDE 0.9 % IV BOLUS (SEPSIS)
1000.0000 mL | Freq: Once | INTRAVENOUS | Status: AC
  Administered 2017-06-13: 1000 mL via INTRAVENOUS

## 2017-06-13 MED ORDER — VANCOMYCIN HCL IN DEXTROSE 1-5 GM/200ML-% IV SOLN
1000.0000 mg | Freq: Once | INTRAVENOUS | Status: DC
  Filled 2017-06-13: qty 200

## 2017-06-13 MED ORDER — CLOPIDOGREL BISULFATE 75 MG PO TABS
75.0000 mg | ORAL_TABLET | Freq: Every day | ORAL | Status: DC
  Administered 2017-06-15 – 2017-06-16 (×2): 75 mg via ORAL
  Filled 2017-06-13 (×2): qty 1

## 2017-06-13 MED ORDER — ONDANSETRON HCL 4 MG/2ML IJ SOLN: 4.0000 mg | Freq: Four times a day (QID) | INTRAMUSCULAR | Status: DC | PRN

## 2017-06-13 MED ORDER — ATORVASTATIN CALCIUM 40 MG PO TABS
40.0000 mg | ORAL_TABLET | Freq: Every day | ORAL | Status: DC
  Administered 2017-06-13 – 2017-06-15 (×3): 40 mg via ORAL
  Filled 2017-06-13 (×3): qty 1

## 2017-06-13 MED ORDER — ENOXAPARIN SODIUM 40 MG/0.4ML ~~LOC~~ SOLN
40.0000 mg | SUBCUTANEOUS | Status: DC
  Administered 2017-06-13 – 2017-06-14 (×2): 40 mg via SUBCUTANEOUS
  Filled 2017-06-13 (×2): qty 0.4

## 2017-06-13 MED ORDER — SODIUM CHLORIDE 0.9 % IV BOLUS (SEPSIS)
500.0000 mL | Freq: Once | INTRAVENOUS | Status: AC
  Administered 2017-06-13: 1000 mL via INTRAVENOUS

## 2017-06-13 MED ORDER — ACETAMINOPHEN 650 MG RE SUPP: 650.0000 mg | Freq: Four times a day (QID) | RECTAL | Status: DC | PRN

## 2017-06-13 MED ORDER — INSULIN ASPART 100 UNIT/ML ~~LOC~~ SOLN
0.0000 [IU] | Freq: Three times a day (TID) | SUBCUTANEOUS | Status: DC
  Administered 2017-06-13: 15 [IU] via SUBCUTANEOUS
  Administered 2017-06-13: 11 [IU] via SUBCUTANEOUS
  Administered 2017-06-14: 4 [IU] via SUBCUTANEOUS
  Administered 2017-06-14 – 2017-06-15 (×2): 11 [IU] via SUBCUTANEOUS
  Administered 2017-06-15: 7 [IU] via SUBCUTANEOUS
  Administered 2017-06-15: 11 [IU] via SUBCUTANEOUS
  Administered 2017-06-16: 4 [IU] via SUBCUTANEOUS
  Administered 2017-06-16: 11 [IU] via SUBCUTANEOUS
  Filled 2017-06-13: qty 1

## 2017-06-13 MED ORDER — LEVOTHYROXINE SODIUM 50 MCG PO TABS
50.0000 ug | ORAL_TABLET | Freq: Every day | ORAL | Status: DC
  Administered 2017-06-14 – 2017-06-16 (×3): 50 ug via ORAL
  Filled 2017-06-13 (×3): qty 1

## 2017-06-13 MED ORDER — ADULT MULTIVITAMIN W/MINERALS CH
1.0000 | ORAL_TABLET | Freq: Every day | ORAL | Status: DC
  Administered 2017-06-14 – 2017-06-16 (×3): 1 via ORAL
  Filled 2017-06-13 (×3): qty 1

## 2017-06-13 MED ORDER — ACETAMINOPHEN 325 MG PO TABS
650.0000 mg | ORAL_TABLET | Freq: Four times a day (QID) | ORAL | Status: DC | PRN
  Administered 2017-06-13 – 2017-06-14 (×2): 650 mg via ORAL
  Filled 2017-06-13 (×2): qty 2

## 2017-06-13 MED ORDER — VITAMIN B-12 1000 MCG PO TABS
1000.0000 ug | ORAL_TABLET | Freq: Every day | ORAL | Status: DC
  Administered 2017-06-14 – 2017-06-16 (×3): 1000 ug via ORAL
  Filled 2017-06-13 (×3): qty 1

## 2017-06-13 MED ORDER — PIPERACILLIN-TAZOBACTAM 3.375 G IVPB 30 MIN
3.3750 g | Freq: Once | INTRAVENOUS | Status: AC
  Administered 2017-06-13: 3.375 g via INTRAVENOUS
  Filled 2017-06-13: qty 50

## 2017-06-13 MED ORDER — ONE-DAILY MULTI VITAMINS PO TABS: 1.0000 | ORAL_TABLET | Freq: Every day | ORAL | Status: DC

## 2017-06-13 MED ORDER — SODIUM CHLORIDE 0.9 % IV SOLN
INTRAVENOUS | Status: DC
  Administered 2017-06-13: 19:00:00 via INTRAVENOUS
  Administered 2017-06-14: 100 mL/h via INTRAVENOUS

## 2017-06-13 MED ORDER — FOLIC ACID 1 MG PO TABS
1.0000 mg | ORAL_TABLET | Freq: Every day | ORAL | Status: DC
Start: 2017-06-14 — End: 2017-06-16
  Administered 2017-06-14 – 2017-06-16 (×3): 1 mg via ORAL
  Filled 2017-06-13 (×3): qty 1

## 2017-06-13 MED ORDER — VANCOMYCIN HCL 10 G IV SOLR
1250.0000 mg | INTRAVENOUS | Status: DC
  Filled 2017-06-13 (×2): qty 1250

## 2017-06-13 MED ORDER — INSULIN GLARGINE 100 UNIT/ML ~~LOC~~ SOLN
30.0000 [IU] | Freq: Every day | SUBCUTANEOUS | Status: DC
  Administered 2017-06-13 – 2017-06-14 (×2): 30 [IU] via SUBCUTANEOUS
  Filled 2017-06-13 (×3): qty 0.3

## 2017-06-13 MED ORDER — AMIODARONE HCL 100 MG PO TABS
100.0000 mg | ORAL_TABLET | Freq: Every day | ORAL | Status: DC
  Administered 2017-06-14 – 2017-06-16 (×3): 100 mg via ORAL
  Filled 2017-06-13 (×3): qty 1

## 2017-06-13 MED ORDER — BUPRENORPHINE HCL 75 MCG BU FILM: 1.0000 | ORAL_FILM | Freq: Two times a day (BID) | BUCCAL | Status: DC

## 2017-06-13 MED ORDER — SODIUM CHLORIDE 0.9 % IV BOLUS (SEPSIS)
1000.0000 mL | Freq: Once | INTRAVENOUS | Status: AC
Start: 2017-06-13 — End: 2017-06-13
  Administered 2017-06-13: 1000 mL via INTRAVENOUS

## 2017-06-13 MED ORDER — VANCOMYCIN HCL 10 G IV SOLR
2000.0000 mg | Freq: Once | INTRAVENOUS | Status: AC
  Administered 2017-06-13: 2000 mg via INTRAVENOUS
  Filled 2017-06-13: qty 2000

## 2017-06-13 MED ORDER — ACETAMINOPHEN 325 MG PO TABS
650.0000 mg | ORAL_TABLET | Freq: Once | ORAL | Status: AC
  Administered 2017-06-13: 650 mg via ORAL
  Filled 2017-06-13: qty 2

## 2017-06-13 NOTE — ED Notes (Signed)
Patient states her lower legs are starting to itch states they do that sometimes

## 2017-06-13 NOTE — H&P (Signed)
Date: 06/13/2017               Patient Name:  [name] MRN: [id number]  DOB: 1961-04-26 Age / Sex: 56 y.o., female   PCP: Sandi Mariscal, MD              Medical Service: Internal Medicine Teaching Service              Attending Physician: Dr. Oval Linsey, MD    First Contact: Jory Ee, OMS-IV Pager: [phone]  Second Contact: Dr. Maricela Bo Pager: [phone]  Third Contact Dr. Donnelly Angelica Pager: [phone]       After Hours (After 5p/  First Contact Pager: [phone]  weekends / holidays): Second Contact Pager: [phone]   Chief Complaint: "vomitting/feeling unwell"   History of Present Illness: [name] is a 56yo Caucasian female  With a medical history significant for Diabetes, Peripheral Artery disease, Paroxismal Afib, Hypertension, Heart Failure with reduced ejection fraction, and COPD. She is status post 2nd right metatarsal amputation on 6/15. She presented to [facility] on 06/13/17 with generalized malaise. The patient states that she started feeling unwell two weeks ago when she noticed purulent drainage coming from her surgical wound. Two days ago she reported 3 episodes of nausea and vomiting. This morning she woke up with a pruritic erythematous rash extending from her right foot to her mid-calf. Additionally she reported generalized weakness this morning. She denies any pain, although she states that she no-longer has any sensation in her lower extremities. Prior to this morning she was afebrile.  While in the Emergency department, she she had a HR of 100 and a temperature of 101.5. Blood cultures were drawn. She was given 3L bolus of NS, and IV Vancomycin and Zoysyn. She also received a Right foot x-ray which was consistent with a fracture of the 2nd metatarsal amputation stump and possible osteomyelitis of the third toe. She will be admitted to step-down unit for sepsis secondary to osteomyelitis.   Meds: Current Facility-Administered Medications  Medication Dose Route Frequency  Provider Last Rate Last Dose  . acetaminophen (TYLENOL) tablet 650 mg  650 mg Oral Q6H PRN Zada Finders, MD       Or  . acetaminophen (TYLENOL) suppository 650 mg  650 mg Rectal Q6H PRN Zada Finders, MD      . albuterol (PROVENTIL) (2.5 MG/3ML) 0.083% nebulizer solution 3 mL  3 mL Inhalation Q6H PRN Zada Finders, MD      . Derrill Memo ON 06/14/2017] amiodarone (PACERONE) tablet 100 mg  100 mg Oral Daily Zada Finders, MD      . atorvastatin (LIPITOR) tablet 40 mg  40 mg Oral q1800 Zada Finders, MD      . Buprenorphine HCl FILM 1 Film  1 Film Buccal BID Zada Finders, MD      . Derrill Memo ON 06/14/2017] clopidogrel (PLAVIX) tablet 75 mg  75 mg Oral Q breakfast Zada Finders, MD      . enoxaparin (LOVENOX) injection 40 mg  40 mg Subcutaneous Q24H Zada Finders, MD      . Derrill Memo ON 03/16/9766] folic acid (FOLVITE) tablet 1 mg  1 mg Oral Daily Zada Finders, MD      . insulin aspart (novoLOG) injection 0-20 Units  0-20 Units Subcutaneous TID WC Zada Finders, MD      . insulin glargine (LANTUS) injection 30 Units  30 Units Subcutaneous QHS Zada Finders, MD      . Derrill Memo ON 06/14/2017] levothyroxine (SYNTHROID, LEVOTHROID) tablet 50 mcg  50  mcg Oral QAC breakfast Zada Finders, MD      . mometasone-formoterol [facility]) 200-5 MCG/ACT inhaler 2 puff  2 puff Inhalation BID Zada Finders, MD      . Derrill Memo ON 06/14/2017] multivitamin with minerals tablet 1 tablet  1 tablet Oral Daily Oval Linsey, MD      . ondansetron Brand [facility]) tablet 4 mg  4 mg Oral Q6H PRN Zada Finders, MD       Or  . ondansetron (ZOFRAN) injection 4 mg  4 mg Intravenous Q6H PRN Zada Finders, MD      . piperacillin-tazobactam (ZOSYN) IVPB 3.375 g  3.375 g Intravenous Q8H Rumbarger, Rachel L, RPH      . sodium chloride 0.9 % bolus 1,000 mL  1,000 mL Intravenous Once Sherwood Gambler, MD      . Derrill Memo ON 06/14/2017] vancomycin (VANCOCIN) 1,250 mg in sodium chloride 0.9 % 250 mL IVPB  1,250 mg Intravenous Q24H Rumbarger, Valeda Malm, RPH      .  vancomycin (VANCOCIN) 2,000 mg in sodium chloride 0.9 % 500 mL IVPB  2,000 mg Intravenous Once Rumbarger, Valeda Malm, RPH      . [START ON 06/14/2017] vitamin B-12 (CYANOCOBALAMIN) tablet 1,000 mcg  1,000 mcg Oral Daily Zada Finders, MD       Current Outpatient Prescriptions  Medication Sig Dispense Refill  . albuterol (PROVENTIL HFA;VENTOLIN HFA) 108 (90 Base) MCG/ACT inhaler Inhale 2 puffs into the lungs every 6 (six) hours as needed for wheezing or shortness of breath. 1 each 3  . amiodarone (PACERONE) 100 MG tablet Take 1 tablet (100 mg total) by mouth daily. 30 tablet 3  . amLODipine (NORVASC) 10 MG tablet TAKE ONE TABLET BY MOUTH ONCE DAILY 30 tablet 3  . atorvastatin (LIPITOR) 40 MG tablet TAKE 1 TABLET BY MOUTH ONCE EVERY EVENING 90 tablet 3  . BELBUCA 75 MCG FILM Place 1 Film inside cheek 2 (two) times daily.   0  . bisoprolol (ZEBETA) 5 MG tablet TAKE ONE-HALF ( 2.5MG  TOTAL) TABLET BY MOUTH DAILY (Patient taking differently: TAKE ONE-HALF TAB (2.5MG  TOTAL) BY MOUTH EVERY EVENING) 15 tablet 3  . cholecalciferol (VITAMIN D) 1000 units tablet Take 1,000 Units by mouth daily.    . clopidogrel (PLAVIX) 75 MG tablet Take 1 tablet (75 mg total) by mouth daily with breakfast. 30 tablet 0  . cyanocobalamin 1000 MCG tablet Take 1 tablet (1,000 mcg total) by mouth daily. 30 tablet 10  . folic acid (FOLVITE) 1 MG tablet TAKE 1 TABLET BY MOUTH EVERY DAY 90 tablet 1  . insulin aspart (NOVOLOG FLEXPEN) 100 UNIT/ML FlexPen Inject 25 Units into the skin 3 (three) times daily with meals. 15 mL 11  . Insulin Glargine (LANTUS SOLOSTAR) 100 UNIT/ML Solostar Pen Inject 48 Units into the skin daily at 10 pm. 15 mL 11  . levothyroxine (SYNTHROID, LEVOTHROID) 50 MCG tablet Take 1 tablet (50 mcg total) by mouth daily. 30 tablet 2  . losartan (COZAAR) 25 MG tablet TAKE ONE TABLET BY MOUTH ONCE DAILY 90 tablet 3  . metolazone (ZAROXOLYN) 2.5 MG tablet TAKE 1 TABLE EVERY WEEK ON THURSDAY. MAY TAKE ONE ADDITIONAL TABLET  AS DIRECTED BY HEART FAILURE CLINIC 21 tablet 3  . Multiple Vitamin (MULTIVITAMIN) tablet Take 1 tablet by mouth daily.    [name] oxyCODONE-acetaminophen (ROXICET) 5-325 MG tablet Take 1 tablet by mouth every 8 (eight) hours as needed for severe pain. 30 tablet 0  . potassium chloride SA (K-DUR,KLOR-CON) 20 MEQ tablet Take 1  tablet (20 mEq total) by mouth daily. Take an additional 20 meq (1 tab) on Thursday with metolazone 35 tablet 3  . pregabalin (LYRICA) 100 MG capsule Take 100 mg by mouth 3 (three) times daily.    [name] spironolactone (ALDACTONE) 25 MG tablet Take 1 tablet (25 mg total) by mouth daily. 90 tablet 3  . SYMBICORT 160-4.5 MCG/ACT inhaler Inhale 2 puffs into the lungs 2 (two) times daily.  8  . traZODone (DESYREL) 100 MG tablet Take 100 mg by mouth at bedtime.    [name] apixaban (ELIQUIS) 5 MG TABS tablet Take 1 tablet (5 mg total) by mouth 2 (two) times daily. (Patient not taking: Reported on 06/13/2017) 60 tablet 0  . torsemide (DEMADEX) 20 MG tablet Take 2 tablets (40 mg total) by mouth 2 (two) times daily. (Patient not taking: Reported on 06/13/2017) 360 tablet 3    Allergies: Allergies as of 06/13/2017 - Review Complete 06/13/2017  Allergen Reaction Noted  . Gabapentin Nausea And Vomiting 08/11/2016  . Lyrica [pregabalin] Other (See Comments) 06/13/2017   Past Medical History:  Diagnosis Date  . Alcohol abuse   . Alcoholic cirrhosis ([place])   . Alcoholic cirrhosis ([place])   . Anemia   . Anxiety   . B12 deficiency   . CAD (coronary artery disease)    a. cath 11/10/2014 LM nl, LAD min irregs, D1 30 ost, D2 50d, LCX 70m, OM1 80 p/m (1.5 mm vessel), OM2 29m, RCA nondom 92m-->med rx.. Demand ischemia in the setting of rapid a-fib.  . Cardiomyopathy ([place])   . Carotid artery disease ([place])    a. 01/2015 Carotid Angio: RICA 268, LICA 34H. s/p L carotid endarterectomy 02/2015.  [name] Chronic combined systolic and diastolic CHF (congestive heart failure) ([place])    a. Last echo 12/205: EF 40-45%,  inferoapical/posterior HK, not technically sufficient to allow eval of LV diastolic function, normal LA in size..  . Cocaine abuse   . COPD (chronic obstructive pulmonary disease) ([place])   . Critical lower limb ischemia   . Diabetic peripheral neuropathy ([place])   . Elevated troponin    a. Chronic elevation.  [name] GERD (gastroesophageal reflux disease)   . Hyperlipemia   . Hypertension   . Hypokalemia   . Hypomagnesemia   . Hypothyroidism   . Marijuana abuse   . Narcotic abuse   . Noncompliance   . NSVT (nonsustained ventricular tachycardia) ([place])   . Obesity   . PAF (paroxysmal atrial fibrillation) ([place])   . Paroxysmal atrial tachycardia ([place])   . Paroxysmal atrial tachycardia ([place])   . Peripheral arterial disease ([place])    a. 01/2015 Angio/PTA: LSFA 100 w/ recon @ adductor canal and 1 vessel runoff via AT, RSFA 99 (atherectomy/pta) - 1 vessel runoff via diff dzs peroneal.  . Poorly controlled type 2 diabetes mellitus ([place])   . Renal insufficiency    a. 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  [name] Symptomatic bradycardia    avoid AV blocking agent per EP  . Tobacco abuse    Past Surgical History:  Procedure Laterality Date  . AMPUTATION TOE Right 04/28/2017   Procedure: AMPUTATION OF RIGHT SECOND RAY;  Surgeon: Newt Minion, MD;  Location: [place];  Service: Orthopedics;  Laterality: Right;  . BALLOON ANGIOPLASTY, ARTERY Right 01/15/2015   SFA/notes 01/15/2015  . CARDIAC CATHETERIZATION    . CARDIAC CATHETERIZATION N/A 01/12/2016   Procedure: Temporary Wire;  Surgeon: Minus Breeding, MD;  Location: [place] CV LAB;  Service: Cardiovascular;  Laterality: N/A;  .  CARDIOVERSION  ~ 02/2013   "twice"   . CAROTID ANGIOGRAM N/A 01/15/2015   Procedure: CAROTID ANGIOGRAM;  Surgeon: [name], MD;  Location: [facility] CATH LAB;  Service: Cardiovascular;  Laterality: N/A;  . DILATION AND CURETTAGE OF UTERUS  1988  . ENDARTERECTOMY Left 02/19/2015   Procedure: LEFT CAROTID ENDARTERECTOMY ;  Surgeon: Serafina Mitchell,  MD;  Location: [place];  Service: Vascular;  Laterality: Left;  . LEFT HEART CATHETERIZATION WITH CORONARY ANGIOGRAM N/A 10/31/2014   Procedure: LEFT HEART CATHETERIZATION WITH CORONARY ANGIOGRAM;  Surgeon: Burnell Blanks, MD;  Location: [facility] CATH LAB;  Service: Cardiovascular;  Laterality: N/A;  . LOWER EXTREMITY ANGIOGRAM N/A 09/10/2013   Procedure: LOWER EXTREMITY ANGIOGRAM;  Surgeon: [name], MD;  Location[facility] C[facility] LAB;  Service: Cardiovascular;  Laterality: N/A;  . LOWER EXTREMITY ANGIOGRAM N/A 01/15/2015   Procedure: LOWER EXTREMITY ANGIOGRAM;  Surgeon: [name], MD;  Location: [facility] CATH LAB;  Service: Cardiovascular;  Laterality: N/A;  . LOWER EXTREMITY ANGIOGRAPHY N/A 04/13/2017   Procedure: Lower Extremity Angiography;  Surgeon: [name], MD;  Location: [place] CV LAB;  Service: Cardiovascular;  Laterality: N/A;  . PERIPHERAL ATHRECTOMY Right 01/15/2015   SFA/notes 01/15/2015  . PERIPHERAL VASCULAR INTERVENTION  04/13/2017   Procedure: Peripheral Vascular Intervention;  Surgeon: [name], MD;  Location: [place] CV LAB;  Service: Cardiovascular;;   Family History  Problem Relation Age of Onset  . Hypertension Mother   . Diabetes Mother   . Cancer Mother        breast, ovarian, colon  . Clotting disorder Mother   . Heart disease Mother   . Heart attack Mother   . Breast cancer Mother   . Hypertension Father   . Heart disease Father   . Emphysema Sister        smoked   Social History   Social History  . Marital status: Single    Spouse name: N/A  . Number of children: N/A  . Years of education: N/A   Occupational History  . disabled    Social History Main Topics  . Smoking status: Former Smoker    Packs/day: 0.00    Years: 40.00    Types: E-cigarettes    Quit date: 04/05/2017  . Smokeless tobacco: Former Systems developer    Types: Snuff  . Alcohol use No     Comment: former alcohol abuse  . Drug use: No     Comment: 04/29/2015 "last drug  use was ~ 09/08/2013"  . Sexual activity: No   Other Topics Concern  . Not on file   Social History Narrative   Lives in [place], in [name] with sister.  They are looking to move but don't have a place to go yet.      Review of Systems: Pertinent items noted in HPI and remainder of comprehensive ROS otherwise negative.  Physical Exam: Blood pressure 92/60, pulse 90, temperature (!) 101.5 F (38.6 C), temperature source Rectal, resp. rate 19, height 5\' 4"  (1.626 m), weight 111.1 kg (245 lb), last menstrual period 11/27/2012, SpO2 98 %. Physical Exam  Constitutional: She is oriented to person, place, and time. She appears well-developed and well-nourished.  HENT:  Head: Normocephalic and atraumatic.  Eyes: Pupils are equal, round, and reactive to light.  Cardiovascular: Regular rhythm, normal heart sounds and intact distal pulses.   Tachycardic  Pulmonary/Chest: Breath sounds normal.  Abdominal: Soft.  Neurological: She is alert and oriented to person, place,  and time.  Skin:  Warmth and erythema present over right lower leg.Bandage present over 2nd  Right metatarsal stump.   1cmx1cm ulcer present over left hallux.  Psychiatric: She has a normal mood and affect. Her behavior is normal.    Lab results: CBC    Component Value Date/Time   WBC 23.2 (H) 06/13/2017 0910   RBC 3.62 (L) 06/13/2017 0910   HGB 10.4 (L) 06/13/2017 0910   HCT 32.5 (L) 06/13/2017 0910   PLT 315 06/13/2017 0910   MCV 89.8 06/13/2017 0910   MCH 28.7 06/13/2017 0910   MCHC 32.0 06/13/2017 0910   RDW 14.9 06/13/2017 0910   LYMPHSABS 1.5 06/13/2017 0910   MONOABS 0.9 06/13/2017 0910   EOSABS 0.0 06/13/2017 0910   BASOSABS 0.0 06/13/2017 0910   CMP     Component Value Date/Time   NA 134 (L) 06/13/2017 0910   K 4.7 06/13/2017 0910   CL 94 (L) 06/13/2017 0910   CO2 26 06/13/2017 0910   GLUCOSE 332 (H) 06/13/2017 0910   BUN 56 (H) 06/13/2017 0910   CREATININE 1.87 (H) 06/13/2017 0910   CREATININE 1.09  (H) 01/25/2016 0947   CALCIUM 9.1 06/13/2017 0910   PROT 7.2 06/13/2017 0910   ALBUMIN 3.0 (L) 06/13/2017 0910   AST 42 (H) 06/13/2017 0910   ALT 36 06/13/2017 0910   ALKPHOS 137 (H) 06/13/2017 0910   BILITOT 0.5 06/13/2017 0910   GFRNONAA 29 (L) 06/13/2017 0910   GFRAA 34 (L) 06/13/2017 0910     Imaging results:  Dg Chest 2 View  Result Date: 06/13/2017 CLINICAL DATA:  Fever EXAM: CHEST  2 VIEW COMPARISON:  04/07/2017 FINDINGS: Heart and mediastinal contours are within normal limits. No focal opacities or effusions. No acute bony abnormality. IMPRESSION: No active cardiopulmonary disease. Electronically Signed   By: Rolm Baptise M.D.   On: 06/13/2017 09:56   Dg Foot Complete Right  Result Date: 06/13/2017 CLINICAL DATA:  Nonhealing wound of the left foot. Status post amputation of the second toe and distal second metatarsal. EXAM: RIGHT FOOT COMPLETE - 3+ VIEW COMPARISON:  Radiographs dated 04/06/2017 and 03/21/2017 FINDINGS: There is a fracture of the stump of the second metatarsal. There is some periosteal reaction. There is new subluxation at the third and MTP joint into lucency in the head of the third metatarsal with slight irregularity of the base of the proximal phalanx of the third toe. Slight chronic degenerative changes of the first MTP joint. IMPRESSION: 1. Fracture of the stump of the second metatarsal. 2. New subluxation at the third MTP joint. 3. Slight irregularity of the cortex of the base of the proximal phalanx of the third toe which could represent osteomyelitis. Electronically Signed   By: Lorriane Shire M.D.   On: 06/13/2017 10:01    Other results: EKG: normal EKG, normal sinus rhythm, unchanged from previous tracings.  Assessment & Plan by Problem:  Patient is a 56yo female with the above past medical history who was admitted for sepsis secondary osteomyelitis of the right foot. Principal Problem:   Osteomyelitis of right foot ([place]) Active Problems:   DM  (diabetes mellitus), type 2 with peripheral vascular complications ([name])   Hypothyroidism   PAF (paroxysmal atrial fibrillation) ([name])   Essential hypertension   Chronic systolic CHF (congestive heart failure) ([name])   COPD GOLD 0    CKD (chronic kidney disease), stage III   Status post amputation of toe of right foot ([place])   Sepsis secondary to  osteomyelitis   -Patient was tachycardic (100bpm) and febrile (101.5) upon admission  -WBC was 23.2 and lactic acid 4.73 -X-ray in ED showed probable osteomyelitis of right 3rd tarsal.  -Starting vancomycin and Zosyn   -Orthopedic Surgery was consulted, awaiting recommendations -blood cultures drawn will deescalate based on culture results and sensitivities  -MRI of right foot ordered to evaluate extent of osteomyelitis  -Monitoring Vitals Q4hr and CBC daily  -NPO @ MN  Diabetes Mellitus -Present on admit  - start sliding scale insulin and 30u lantus in the morning  --Accu check every 4hr and before meals    Hypothyroidsim  -stable,present  -contiue synthroid    Paroxsymal Afib  -Present upon admission  -Upon admission patient was in sinus rhythm  -anticoagulation (eliquis) held for possible ortho procedure   -Continue amiodarone    Hypertension  -Patient hypotensive upon admission  -Holding home BP medications until pressure 120/80    AKI on CKD (Stage III)  -Secondary to sepsis - Patients creatinine is elevated at 1.87 from 1.57 (one month ago)  -Rehydrating with 1L NS bolus  -Holding nephrotoxic medications (Lisinopril,Tourosemide)  Chronic Systolic CHF  -Present upon admission,stable  - Continue Atrovastatin  -Holding Torosemide and lisinopril due to AKI   COPD  -Stable,Present at admission -Continue Home Meds      This is a Careers information officer Note.  The care of the patient was discussed with Dr. Posey Pronto and the assessment and plan was formulated with their assistance.  Please see their note for official documentation of  the patient encounter.   Signed: Dwaine Deter, Medical Student 06/13/2017, 12:09 PM   Attestation for Student Documentation:  I personally was present and performed or re-performed the history, physical exam and medical decision-making activities of this service and have verified that the service and findings are accurately documented in the student's note.  Zada Finders, MD 06/13/2017, 1:20 PM

## 2017-06-13 NOTE — ED Notes (Signed)
Patient c/o increased urination and n/v for the last several days, states she would feel hot and cold. C/o dry cough. States she had her toe on right foot amp. Several weeks ago, patient has reddness and edema to both lower ext

## 2017-06-13 NOTE — ED Notes (Signed)
Admitting stopped in while sending pt to MRI.  Admitting at bedside at this time.  MRI on standby.

## 2017-06-13 NOTE — ED Notes (Signed)
Returned from xray status unchanged.

## 2017-06-13 NOTE — ED Notes (Signed)
Attempted report x1. 

## 2017-06-13 NOTE — ED Provider Notes (Signed)
[facility] DEPT Provider Note   CSN: [id number] Arrival date & time: 06/13/17  0825     History   Chief Complaint Chief Complaint  Patient presents with  . Hyperglycemia    HPI [name] is a 56 y.o. female.  HPI  56 year old female with an extensive past medical history presents with not feeling well and nausea with vomiting. Her glucose has been running high recently and was 379 this morning. She took 20 units of insulin after that. She states she's not been feeling well for the last few days. No specific symptoms besides the nausea and vomiting. She vomited after drinking Kool-Aid yesterday and states it was red. She had a bowel movement this morning and then after wiping noticed some mild blood on the toilet paper but no blood in the stool or toilet. No abdominal pain. On 6/15 she had an amputation of the right second toe for gangrene and feels like it is not healing well. There is a foul smell to it. She has noticed some drainage. She also is currently on doxycycline. She has a temperature 100.2 on arrival to the ED, did not know she's been having a fever. She denies chest pain or shortness of breath. No headache. No urinary symptoms.  Past Medical History:  Diagnosis Date  . Alcohol abuse   . Alcoholic cirrhosis ([place])   . Alcoholic cirrhosis ([place])   . Anemia   . Anxiety   . B12 deficiency   . CAD (coronary artery disease)    a. cath 11/10/2014 LM nl, LAD min irregs, D1 30 ost, D2 50d, LCX 81m, OM1 80 p/m (1.5 mm vessel), OM2 38m, RCA nondom 22m-->med rx.. Demand ischemia in the setting of rapid a-fib.  . Cardiomyopathy ([place])   . Carotid artery disease ([place])    a. 01/2015 Carotid Angio: RICA 163, LICA 84Y. s/p L carotid endarterectomy 02/2015.  [name] Chronic combined systolic and diastolic CHF (congestive heart failure) ([place])    a. Last echo 12/205: EF 40-45%, inferoapical/posterior HK, not technically sufficient to allow eval of LV diastolic function, normal LA in size..    . Cocaine abuse   . COPD (chronic obstructive pulmonary disease) ([place])   . Critical lower limb ischemia   . Diabetic peripheral neuropathy ([place])   . Elevated troponin    a. Chronic elevation.  [name] GERD (gastroesophageal reflux disease)   . Hyperlipemia   . Hypertension   . Hypokalemia   . Hypomagnesemia   . Hypothyroidism   . Marijuana abuse   . Narcotic abuse   . Noncompliance   . NSVT (nonsustained ventricular tachycardia) ([place])   . Obesity   . PAF (paroxysmal atrial fibrillation) ([place])   . Paroxysmal atrial tachycardia ([place])   . Paroxysmal atrial tachycardia ([place])   . Peripheral arterial disease ([place])    a. 01/2015 Angio/PTA: LSFA 100 w/ recon @ adductor canal and 1 vessel runoff via AT, RSFA 99 (atherectomy/pta) - 1 vessel runoff via diff dzs peroneal.  . Poorly controlled type 2 diabetes mellitus ([place])   . Renal insufficiency    a. 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  [name] Symptomatic bradycardia    avoid AV blocking agent per EP  . Tobacco abuse     Patient Active Problem List   Diagnosis Date Noted  . Osteomyelitis of right foot ([place]) 06/13/2017  . Status post amputation of toe of right foot ([place]) 06/05/2017  . Dehiscence of external surgical wound 05/29/2017  . 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 H/O amputation of lesser toe, right ([place]) 05/19/2017  . PAD (peripheral artery disease) ([place])   . Severe sepsis ([place]) 04/06/2017  . CKD (chronic kidney disease), stage III 11/24/2016  . Suspected sleep apnea 11/24/2016  . Severe obesity (BMI >= 40) ([place]) 02/24/2016  . COPD GOLD 0  02/24/2016  . Chronic systolic CHF (congestive heart failure) ([place]) 02/12/2016  . Encounter for therapeutic drug monitoring 02/10/2016  . Symptomatic bradycardia 01/12/2016  . Essential hypertension 12/22/2015  . Wheeze   . 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 Tobacco abuse 10/23/2015  . Coronary artery disease involving native  coronary artery of native heart   . DOE (dyspnea on exertion) 04/29/2015  . PAF (paroxysmal atrial fibrillation) ([place]) 01/16/2015  . Carotid arterial disease ([place]) 01/16/2015  . Claudication ([place]) 01/15/2015  . Demand ischemia ([place]) 10/29/2014  . Insomnia 02/03/2014  . S/P peripheral artery angioplasty - [place] atherectomy; R SFA 09/11/2013    Class: Acute  . Leg pain, bilateral 08/19/2013  . Hypothyroidism 07/31/2013  . Cellulitis 06/13/2013  . History of cocaine abuse 06/13/2013  . Long term current use of anticoagulant therapy 05/20/2013  . Alcohol abuse   . Narcotic abuse   . Marijuana abuse   . Alcoholic cirrhosis ([place])   . DM (diabetes mellitus), type 2 with peripheral vascular complications [facility])     Past Surgical History:  Procedure Laterality Date  . AMPUTATION TOE Right 04/28/2017   Procedure: AMPUTATION OF RIGHT SECOND RAY;  Surgeon: Newt Minion, MD;  Location: [facility];  Service: Orthopedics;  Laterality: Right;  . BALLOON ANGIOPLASTY, ARTERY Right 01/15/2015   SFA/notes 01/15/2015  . CARDIAC CATHETERIZATION    . CARDIAC CATHETERIZATION N/A 01/12/2016   Procedure: Temporary Wire;  Surgeon: Minus Breeding, MD;  Location: [facility] CV LAB;  Service: Cardiovascular;  Laterality: N/A;  . CARDIOVERSION  ~ 02/2013   "twice"   . CAROTID ANGIOGRAM N/A 01/15/2015   Procedure: CAROTID ANGIOGRAM;  Surgeon: [name], MD;  Location: [facility] CATH LAB;  Service: Cardiovascular;  Laterality: N/A;  . DILATION AND CURETTAGE OF UTERUS  1988  . ENDARTERECTOMY Left 02/19/2015   Procedure: LEFT CAROTID ENDARTERECTOMY ;  Surgeon: Serafina Mitchell, MD;  Location: [facility];  Service: Vascular;  Laterality: Left;  . LEFT HEART CATHETERIZATION WITH CORONARY ANGIOGRAM N/A 10/31/2014   Procedure: LEFT HEART CATHETERIZATION WITH CORONARY ANGIOGRAM;  Surgeon: Burnell Blanks, MD;  Location[facility] LAB;  Service: Cardiovascular;  Laterality: N/A;  . LOWER EXTREMITY ANGIOGRAM N/A 09/10/2013    Procedure: LOWER EXTREMITY ANGIOGRAM;  Surgeon: [name], MD;  Location: [facility] C[facility] LAB;  Service: Cardiovascular;  Laterality: N/A;  . LOWER EXTREMITY ANGIOGRAM N/A 01/15/2015   Procedure: LOWER EXTREMITY ANGIOGRAM;  Surgeon: [name], MD;  Location: [facility] CATH LAB;  Service: Cardiovascular;  Laterality: N/A;  . LOWER EXTREMITY ANGIOGRAPHY N/A 04/13/2017   Procedure: Lower Extremity Angiography;  Surgeon: [name], MD;  Location: [facility] CV LAB;  Service: Cardiovascular;  Laterality: N/A;  . PERIPHERAL ATHRECTOMY Right 01/15/2015   SFA/notes 01/15/2015  . PERIPHERAL VASCULAR INTERVENTION  04/13/2017   Procedure: Peripheral Vascular Intervention;  Surgeon: [name], MD;  Location: [facility] CV LAB;  Service: Cardiovascular;;    OB History    Gravida Para Term Preterm AB Living   1 1 1     1    SAB TAB Ectopic Multiple Live Births  Home Medications    Prior to Admission medications   Medication Sig Start Date End Date Taking? Authorizing Provider  albuterol (PROVENTIL HFA;VENTOLIN HFA) 108 (90 Base) MCG/ACT inhaler Inhale 2 puffs into the lungs every 6 (six) hours as needed for wheezing or shortness of breath. 03/01/16  Yes Arnoldo Morale, MD  amiodarone (PACERONE) 100 MG tablet Take 1 tablet (100 mg total) by mouth daily. 04/17/17  Yes Bensimhon, Shaune Pascal, MD  amLODipine (NORVASC) 10 MG tablet TAKE ONE TABLET BY MOUTH ONCE DAILY 04/28/17  Yes Bensimhon, Shaune Pascal, MD  apixaban (ELIQUIS) 5 MG TABS tablet Take 1 tablet (5 mg total) by mouth 2 (two) times daily. 04/14/17  Yes Ghimire, Henreitta Leber, MD  atorvastatin (LIPITOR) 40 MG tablet TAKE 1 TABLET BY MOUTH ONCE EVERY EVENING 04/17/17  Yes Bensimhon, Shaune Pascal, MD  BELBUCA 75 MCG FILM Place 1 Film inside cheek 2 (two) times daily.  04/21/17  Yes [provider]  bisoprolol (ZEBETA) 5 MG tablet TAKE ONE-HALF ( 2.5MG  TOTAL) TABLET BY MOUTH DAILY Patient taking differently: TAKE ONE-HALF TAB (2.5MG   TOTAL) BY MOUTH EVERY EVENING 04/28/17  Yes Bensimhon, Shaune Pascal, MD  cholecalciferol (VITAMIN D) 1000 units tablet Take 1,000 Units by mouth daily.   Yes [provider]  clopidogrel (PLAVIX) 75 MG tablet Take 1 tablet (75 mg total) by mouth daily with breakfast. 05/11/17  Yes [name], MD  cyanocobalamin 1000 MCG tablet Take 1 tablet (1,000 mcg total) by mouth daily. 05/11/16  Yes Lyda Jester M, PA-C  doxycycline (VIBRA-TABS) 100 MG tablet Take 1 tablet (100 mg total) by mouth 2 (two) times daily. 05/22/17  Yes Newt Minion, MD  Multiple Vitamin (MULTIVITAMIN) tablet Take 1 tablet by mouth daily.   Yes [provider]  doxycycline (VIBRAMYCIN) 100 MG capsule Take 100 mg by mouth 2 (two) times daily.    [provider]  folic acid (FOLVITE) 1 MG tablet TAKE 1 TABLET BY MOUTH EVERY DAY 11/30/16   [name], MD  insulin aspart (NOVOLOG FLEXPEN) 100 UNIT/ML FlexPen Inject 25 Units into the skin 3 (three) times daily with meals. 04/14/17   Ghimire, Henreitta Leber, MD  Insulin Glargine (LANTUS SOLOSTAR) 100 UNIT/ML Solostar Pen Inject 48 Units into the skin daily at 10 pm. 04/14/17   Ghimire, Henreitta Leber, MD  levothyroxine (SYNTHROID, LEVOTHROID) 50 MCG tablet Take 1 tablet (50 mcg total) by mouth daily. 06/30/15   Arnoldo Morale, MD  losartan (COZAAR) 25 MG tablet TAKE ONE TABLET BY MOUTH ONCE DAILY 04/28/17   Bensimhon, Shaune Pascal, MD  metolazone (ZAROXOLYN) 2.5 MG tablet TAKE 1 TABLE EVERY WEEK ON THURSDAY. MAY TAKE ONE ADDITIONAL TABLET AS DIRECTED BY HEART FAILURE CLINIC 05/31/17   Bensimhon, Shaune Pascal, MD  oxyCODONE-acetaminophen (ROXICET) 5-325 MG tablet Take 1 tablet by mouth every 8 (eight) hours as needed for severe pain. 05/22/17   Newt Minion, MD  potassium chloride SA (K-DUR,KLOR-CON) 20 MEQ tablet Take 1 tablet (20 mEq total) by mouth daily. Take an additional 20 meq (1 tab) on Thursday with metolazone 04/17/17   Bensimhon, Shaune Pascal, MD  spironolactone (ALDACTONE)  25 MG tablet Take 1 tablet (25 mg total) by mouth daily. 04/17/17   Bensimhon, Shaune Pascal, MD  SYMBICORT 160-4.5 MCG/ACT inhaler Inhale 2 puffs into the lungs 2 (two) times daily. 03/28/17   [provider]  torsemide (DEMADEX) 20 MG tablet Take 2 tablets (40 mg total) by mouth 2 (two) times daily. 04/17/17  Bensimhon, Shaune Pascal, MD    Family History Family History  Problem Relation Age of Onset  . Hypertension Mother   . Diabetes Mother   . Cancer Mother        breast, ovarian, colon  . Clotting disorder Mother   . Heart disease Mother   . Heart attack Mother   . Breast cancer Mother   . Hypertension Father   . Heart disease Father   . Emphysema Sister        smoked    Social History Social History  Substance Use Topics  . Smoking status: Former Smoker    Packs/day: 0.00    Years: 40.00    Types: E-cigarettes    Quit date: 04/05/2017  . Smokeless tobacco: Former Systems developer    Types: Snuff  . Alcohol use No     Comment: former alcohol abuse     Allergies   Gabapentin and Lyrica [pregabalin]   Review of Systems Review of Systems  Constitutional: Positive for fatigue. Negative for fever.  Respiratory: Negative for cough and shortness of breath.   Cardiovascular: Negative for chest pain.  Gastrointestinal: Positive for blood in stool, nausea and vomiting. Negative for abdominal pain.  Genitourinary: Negative for dysuria.  Skin: Positive for wound.  All other systems reviewed and are negative.    Physical Exam Updated Vital Signs BP 124/70 (BP Location: Right Arm)   Pulse 100   Temp (!) 101.5 F (38.6 C) (Rectal)   Resp 18   Ht 5\' 4"  (1.626 m)   Wt 111.1 kg (245 lb)   LMP 11/27/2012   SpO2 95%   BMI 42.05 kg/m   Physical Exam  Constitutional: She is oriented to person, place, and time. She appears well-developed and well-nourished.  obese  HENT:  Head: Normocephalic and atraumatic.  Right Ear: External ear normal.  Left Ear: External ear normal.  Nose:  Nose normal.  Eyes: Right eye exhibits no discharge. Left eye exhibits no discharge.  Cardiovascular: Normal rate, regular rhythm and normal heart sounds.   Pulmonary/Chest: Effort normal and breath sounds normal.  Abdominal: Soft. She exhibits no distension. There is no tenderness.  Genitourinary: Rectal exam shows no external hemorrhoid, no internal hemorrhoid and no tenderness.  Genitourinary Comments: Brown stool on exam  Musculoskeletal:  See image below of right foot. Foul smelling with drainage. RLE is diffusely swollen. Bilateral lower legs above ankles with erythema but RLE is warm to touch while LLE is normal warmth. LLE has 2 small wounds that appears superficial, distal lower leg and heel  Neurological: She is alert and oriented to person, place, and time.  Skin: Skin is warm and dry. She is not diaphoretic. There is erythema.  Nursing note and vitals reviewed.      ED Treatments / Results  Labs (all labs ordered are listed, but only abnormal results are displayed) Labs Reviewed  COMPREHENSIVE METABOLIC PANEL - Abnormal; Notable for the following:       Result Value   Sodium 134 (*)    Chloride 94 (*)    Glucose, Bld 332 (*)    BUN 56 (*)    Creatinine, Ser 1.87 (*)    Albumin 3.0 (*)    AST 42 (*)    Alkaline Phosphatase 137 (*)    GFR calc non Af Amer 29 (*)    GFR calc Af Amer 34 (*)    All other components within normal limits  CBC WITH DIFFERENTIAL/PLATELET - Abnormal; Notable for the  following:    WBC 23.2 (*)    RBC 3.62 (*)    Hemoglobin 10.4 (*)    HCT 32.5 (*)    Neutro Abs 20.8 (*)    All other components within normal limits  URINALYSIS, ROUTINE W REFLEX MICROSCOPIC - Abnormal; Notable for the following:    APPearance HAZY (*)    Glucose, UA 50 (*)    All other components within normal limits  I-STAT CG4 LACTIC ACID, ED - Abnormal; Notable for the following:    Lactic Acid, Venous 4.73 (*)    All other components within normal limits  POC OCCULT  BLOOD, ED - Abnormal; Notable for the following:    Fecal Occult Bld POSITIVE (*)    All other components within normal limits  CBG MONITORING, ED - Abnormal; Notable for the following:    Glucose-Capillary 360 (*)    All other components within normal limits  CULTURE, BLOOD (ROUTINE X 2)  CULTURE, BLOOD (ROUTINE X 2)  URINE CULTURE    EKG  EKG Interpretation  Date/Time:  Tuesday June 13 2017 08:43:32 EDT Ventricular Rate:  100 PR Interval:    QRS Duration: 92 QT Interval:  369 QTC Calculation: 476 R Axis:   21 Text Interpretation:  Sinus tachycardia Low voltage, precordial leads Abnormal R-wave progression, early transition Baseline wander in lead(s) V5 Artifact Confirmed by Sherwood Gambler [phone]) on 06/13/2017 8:52:20 AM       Radiology Dg Chest 2 View  Result Date: 06/13/2017 CLINICAL DATA:  Fever EXAM: CHEST  2 VIEW COMPARISON:  04/07/2017 FINDINGS: Heart and mediastinal contours are within normal limits. No focal opacities or effusions. No acute bony abnormality. IMPRESSION: No active cardiopulmonary disease. Electronically Signed   By: Rolm Baptise M.D.   On: 06/13/2017 09:56   Dg Foot Complete Right  Result Date: 06/13/2017 CLINICAL DATA:  Nonhealing wound of the left foot. Status post amputation of the second toe and distal second metatarsal. EXAM: RIGHT FOOT COMPLETE - 3+ VIEW COMPARISON:  Radiographs dated 04/06/2017 and 03/21/2017 FINDINGS: There is a fracture of the stump of the second metatarsal. There is some periosteal reaction. There is new subluxation at the third and MTP joint into lucency in the head of the third metatarsal with slight irregularity of the base of the proximal phalanx of the third toe. Slight chronic degenerative changes of the first MTP joint. IMPRESSION: 1. Fracture of the stump of the second metatarsal. 2. New subluxation at the third MTP joint. 3. Slight irregularity of the cortex of the base of the proximal phalanx of the third toe which could  represent osteomyelitis. Electronically Signed   By: Lorriane Shire M.D.   On: 06/13/2017 10:01    Procedures Procedures (including critical care time)  CRITICAL CARE Performed by: Sherwood Gambler T   Total critical care time: 30 minutes  Critical care time was exclusive of separately billable procedures and treating other patients.  Critical care was necessary to treat or prevent imminent or life-threatening deterioration.  Critical care was time spent personally by me on the following activities: development of treatment plan with patient and/or surrogate as well as nursing, discussions with consultants, evaluation of patient's response to treatment, examination of patient, obtaining history from patient or surrogate, ordering and performing treatments and interventions, ordering and review of laboratory studies, ordering and review of radiographic studies, pulse oximetry and re-evaluation of patient's condition.   Medications Ordered in ED Medications  vancomycin (VANCOCIN) 2,000 mg in sodium chloride 0.9 %  500 mL IVPB (not administered)  sodium chloride 0.9 % bolus 1,000 mL (not administered)    And  sodium chloride 0.9 % bolus 1,000 mL (1,000 mLs Intravenous New Bag/Given 06/13/17 1020)    And  sodium chloride 0.9 % bolus 1,000 mL (1,000 mLs Intravenous New Bag/Given 06/13/17 1030)    And  sodium chloride 0.9 % bolus 500 mL (not administered)  piperacillin-tazobactam (ZOSYN) IVPB 3.375 g (0 g Intravenous Stopped 06/13/17 1020)  acetaminophen (TYLENOL) tablet 650 mg (650 mg Oral Given 06/13/17 0857)     Initial Impression / Assessment and Plan / ED Course  I have reviewed the triage vital signs and the nursing notes.  Pertinent labs & imaging results that were available during my care of the patient were reviewed by me and considered in my medical decision making (see chart for details).     Patient appears not be feeling well due to her fever. She has no focal symptoms except  for the foot which appears to have an acute on chronic infection. Likely a component of osteomyelitis. She was given Vanco, Zosyn, and was broadened out to sepsis 30 mL/KG fluids given the lactic acid over 4. I discussed the seriousness of her illness with her and she understands. Orthopedics was consulted. Internal medicine teaching service will admit.  Final Clinical Impressions(s) / ED Diagnoses   Final diagnoses:  Septic shock ([place])  Acute osteomyelitis of right foot [facility])    New Prescriptions New Prescriptions   No medications on file     Sherwood Gambler, MD 06/13/17 1609

## 2017-06-13 NOTE — ED Notes (Signed)
cbg was 360

## 2017-06-13 NOTE — Progress Notes (Signed)
Patient ID: LAQUITTA DOMINSKI, female   DOB: 01/20/1961, 56 y.o.   MRN: [id number]   LOS: 0 days   Subjective: [name] came to the ED today because she felt like her surgical incision was not healing properly. She denies pain though her foot is nearly insensate baseline.    Objective: Vital signs in last 24 hours: Temp:  [100.3 F (37.9 C)-101.5 F (38.6 C)] 101.5 F (38.6 C) (07/31 0846) Pulse Rate:  [100-102] 100 (07/31 0846) Resp:  [15-18] 18 (07/31 0846) BP: (124-134)/(68-70) 124/70 (07/31 0846) SpO2:  [95 %-99 %] 95 % (07/31 0846) Weight:  [111.1 kg (245 lb)] 111.1 kg (245 lb) (07/31 0827)     Laboratory  CBC  Recent Labs  06/13/17 0910  WBC 23.2*  HGB 10.4*  HCT 32.5*  PLT 315   BMET  Recent Labs  06/13/17 0910  NA 134*  K 4.7  CL 94*  CO2 26  GLUCOSE 332*  BUN 56*  CREATININE 1.87*  CALCIUM 9.1     Physical Exam General appearance: alert and no distress  RLE S/p 2nd ray amputation, some wound dehiscence with malodorous discharge  Nontender  No effusions  Knee stable to varus/ valgus and anterior/posterior stress  Sens DPN, SPN, TN absent  Motor EHL, ext, flex, evers 5/5  DP 0, PT 0, No significant edema    Assessment/Plan: S/p right 2nd ray amputation w/wound infection -- IM to admit, IV abx and wound care. Dr. Sharol Given to evaluate.    Lisette Abu, PA-C Orthopedic Surgery [phone] 06/13/2017

## 2017-06-13 NOTE — Progress Notes (Signed)
Attempted to call ED RN back for report. Unavailable.

## 2017-06-13 NOTE — ED Notes (Signed)
Pt transported to MRI 

## 2017-06-13 NOTE — ED Notes (Signed)
cbg was 315

## 2017-06-13 NOTE — Progress Notes (Signed)
Pharmacy Antibiotic Note  HADLI VANDEMARK is a 56 y.o. female admitted on 06/13/2017 with cellulitis.  Pharmacy has been consulted for vancomycin and zosyn dosing. Tmax is 101.5 and WBC is elevated at 23.2. SCr is elevated above baseline at 1.87. Lactic acid is elevated at 4.73.   Plan: Vancomycin 2gm IV x 1 then 1250mg  IV Q24H Zosyn 3.375gm IV Q8H (4 hr inf) F/u renal fxn, C&S, clinical status and trough at SS  Height: 5\' 4"  (162.6 cm) Weight: 245 lb (111.1 kg) IBW/kg (Calculated) : 54.7  Temp (24hrs), Avg:100.9 F (38.3 C), Min:100.3 F (37.9 C), Max:101.5 F (38.6 C)  No results for input(s): WBC, CREATININE, LATICACIDVEN, VANCOTROUGH, VANCOPEAK, VANCORANDOM, GENTTROUGH, GENTPEAK, GENTRANDOM, TOBRATROUGH, TOBRAPEAK, TOBRARND, AMIKACINPEAK, AMIKACINTROU, AMIKACIN in the last 168 hours.  CrCl cannot be calculated (Patient's most recent lab result is older than the maximum 21 days allowed.).    Allergies  Allergen Reactions  . Gabapentin Nausea And Vomiting    POSSIBLE SHAKING? TAKING MED CURRENTLY    Antimicrobials this admission: Vanc 7/31>> Zosyn 7/31>>  Dose adjustments this admission: N/A  Microbiology results: Pending  Thank you for allowing pharmacy to be a part of this patient's care.  Ambriella Kitt, [name] 06/13/2017 8:52 AM

## 2017-06-13 NOTE — ED Notes (Signed)
Lab Results reported to Nurse Claiborne Billings.

## 2017-06-13 NOTE — ED Notes (Signed)
cbg was 298

## 2017-06-13 NOTE — Progress Notes (Signed)
Wound at R foot cleansed, covered with NS wet to dry dressing until further orders received from MD or WOCN.

## 2017-06-14 ENCOUNTER — Inpatient Hospital Stay (HOSPITAL_COMMUNITY): Payer: Medicare Other | Admitting: Anesthesiology

## 2017-06-14 ENCOUNTER — Encounter (HOSPITAL_COMMUNITY): Payer: Self-pay

## 2017-06-14 ENCOUNTER — Encounter (HOSPITAL_COMMUNITY)
Admission: EM | Disposition: A | Discharge status: Skilled Nursing Facility | Payer: Self-pay | Source: Home / Self Care | Attending: Internal Medicine

## 2017-06-14 ENCOUNTER — Ambulatory Visit: Payer: Self-pay | Admitting: Pharmacist Clinician (PhC)/ Clinical Pharmacy Specialist

## 2017-06-14 DIAGNOSIS — M86171 Other acute osteomyelitis, right ankle and foot: Secondary | ICD-10-CM

## 2017-06-14 DIAGNOSIS — E1151 Type 2 diabetes mellitus with diabetic peripheral angiopathy without gangrene: Secondary | ICD-10-CM

## 2017-06-14 DIAGNOSIS — Z5181 Encounter for therapeutic drug level monitoring: Secondary | ICD-10-CM

## 2017-06-14 DIAGNOSIS — I480 Paroxysmal atrial fibrillation: Secondary | ICD-10-CM

## 2017-06-14 HISTORY — PX: AMPUTATION: SHX166

## 2017-06-14 LAB — GLUCOSE, CAPILLARY
GLUCOSE-CAPILLARY: 301 mg/dL — AB (ref 65–99)
GLUCOSE-CAPILLARY: 180 mg/dL — AB (ref 65–99)
GLUCOSE-CAPILLARY: 176 mg/dL — AB (ref 65–99)
Glucose-Capillary: 271 mg/dL — ABNORMAL HIGH (ref 65–99)
Glucose-Capillary: 208 mg/dL — ABNORMAL HIGH (ref 65–99)
Glucose-Capillary: 373 mg/dL — ABNORMAL HIGH (ref 65–99)

## 2017-06-14 LAB — COMPREHENSIVE METABOLIC PANEL
ALBUMIN: 2.3 g/dL — AB (ref 3.5–5.0)
ALK PHOS: 115 U/L (ref 38–126)
ALT: 28 U/L (ref 14–54)
ANION GAP: 6 (ref 5–15)
AST: 26 U/L (ref 15–41)
BILIRUBIN TOTAL: 0.6 mg/dL (ref 0.3–1.2)
BUN: 35 mg/dL — ABNORMAL HIGH (ref 6–20)
CALCIUM: 8 mg/dL — AB (ref 8.9–10.3)
CO2: 26 mmol/L (ref 22–32)
Chloride: 102 mmol/L (ref 101–111)
Creatinine, Ser: 1.35 mg/dL — ABNORMAL HIGH (ref 0.44–1.00)
GFR calc non Af Amer: 44 mL/min — ABNORMAL LOW (ref >60)
GFR, EST AFRICAN AMERICAN: 51 mL/min — AB (ref >60)
GLUCOSE: 287 mg/dL — AB (ref 65–99)
POTASSIUM: 4.3 mmol/L (ref 3.5–5.1)
SODIUM: 134 mmol/L — AB (ref 135–145)
TOTAL PROTEIN: 5.9 g/dL — AB (ref 6.5–8.1)

## 2017-06-14 LAB — CBC
HEMATOCRIT: 29.9 % — AB (ref 36.0–46.0)
HEMOGLOBIN: 9.2 g/dL — AB (ref 12.0–15.0)
MCH: 28 pg (ref 26.0–34.0)
MCHC: 30.8 g/dL (ref 30.0–36.0)
MCV: 91.2 fL (ref 78.0–100.0)
Platelets: 225 10*3/uL (ref 150–400)
RBC: 3.28 MIL/uL — AB (ref 3.87–5.11)
RDW: 15.7 % — ABNORMAL HIGH (ref 11.5–15.5)
WBC: 15.8 10*3/uL — ABNORMAL HIGH (ref 4.0–10.5)

## 2017-06-14 LAB — MRSA CULTURE: Culture: NOT DETECTED

## 2017-06-14 LAB — URINE CULTURE: Culture: NO GROWTH

## 2017-06-14 LAB — SURGICAL PCR SCREEN
MRSA, PCR: NEGATIVE
Staphylococcus aureus: NEGATIVE

## 2017-06-14 LAB — LACTIC ACID, PLASMA: LACTIC ACID, VENOUS: 1.6 mmol/L (ref 0.5–1.9)

## 2017-06-14 SURGERY — AMPUTATION, FOOT, PARTIAL
Anesthesia: General | Site: Foot | Laterality: Right

## 2017-06-14 MED ORDER — MIDAZOLAM HCL 2 MG/2ML IJ SOLN
INTRAMUSCULAR | Status: AC
  Filled 2017-06-14: qty 2

## 2017-06-14 MED ORDER — METHOCARBAMOL 1000 MG/10ML IJ SOLN
500.0000 mg | Freq: Four times a day (QID) | INTRAVENOUS | Status: DC | PRN
  Filled 2017-06-14: qty 5

## 2017-06-14 MED ORDER — MUPIROCIN 2 % EX OINT
TOPICAL_OINTMENT | CUTANEOUS | Status: AC
  Administered 2017-06-14: 1 via TOPICAL
  Filled 2017-06-14: qty 22

## 2017-06-14 MED ORDER — TRAZODONE HCL 50 MG PO TABS: 100.0000 mg | ORAL_TABLET | Freq: Every day | ORAL | Status: DC

## 2017-06-14 MED ORDER — OXYCODONE-ACETAMINOPHEN 5-325 MG PO TABS
1.0000 | ORAL_TABLET | Freq: Once | ORAL | Status: AC
  Administered 2017-06-14: 1 via ORAL
  Filled 2017-06-14: qty 1

## 2017-06-14 MED ORDER — METOCLOPRAMIDE HCL 5 MG PO TABS: 5.0000 mg | ORAL_TABLET | Freq: Three times a day (TID) | ORAL | Status: DC | PRN

## 2017-06-14 MED ORDER — 0.9 % SODIUM CHLORIDE (POUR BTL) OPTIME
TOPICAL | Status: DC | PRN
  Administered 2017-06-14: 1000 mL

## 2017-06-14 MED ORDER — OXYCODONE HCL 5 MG PO TABS
ORAL_TABLET | ORAL | Status: AC
  Filled 2017-06-14: qty 2

## 2017-06-14 MED ORDER — ACETAMINOPHEN 325 MG PO TABS
650.0000 mg | ORAL_TABLET | Freq: Four times a day (QID) | ORAL | Status: DC | PRN
Start: 2017-06-14 — End: 2017-06-15

## 2017-06-14 MED ORDER — OXYCODONE HCL 5 MG PO TABS
5.0000 mg | ORAL_TABLET | ORAL | Status: DC | PRN
  Administered 2017-06-14: 10 mg via ORAL

## 2017-06-14 MED ORDER — PROPOFOL 10 MG/ML IV BOLUS
INTRAVENOUS | Status: DC | PRN
  Administered 2017-06-14: 160 mg via INTRAVENOUS

## 2017-06-14 MED ORDER — MIDAZOLAM HCL 5 MG/5ML IJ SOLN
INTRAMUSCULAR | Status: DC | PRN
  Administered 2017-06-14: 2 mg via INTRAVENOUS

## 2017-06-14 MED ORDER — ACETAMINOPHEN 650 MG RE SUPP: 650.0000 mg | Freq: Four times a day (QID) | RECTAL | Status: DC | PRN

## 2017-06-14 MED ORDER — METHOCARBAMOL 500 MG PO TABS
ORAL_TABLET | ORAL | Status: AC
  Filled 2017-06-14: qty 1

## 2017-06-14 MED ORDER — METHOCARBAMOL 500 MG PO TABS
500.0000 mg | ORAL_TABLET | Freq: Four times a day (QID) | ORAL | Status: DC | PRN
  Administered 2017-06-14 – 2017-06-16 (×4): 500 mg via ORAL
  Filled 2017-06-14 (×3): qty 1

## 2017-06-14 MED ORDER — METOCLOPRAMIDE HCL 5 MG/ML IJ SOLN: 5.0000 mg | Freq: Three times a day (TID) | INTRAMUSCULAR | Status: DC | PRN

## 2017-06-14 MED ORDER — FENTANYL CITRATE (PF) 250 MCG/5ML IJ SOLN
INTRAMUSCULAR | Status: AC
  Filled 2017-06-14: qty 5

## 2017-06-14 MED ORDER — FENTANYL CITRATE (PF) 100 MCG/2ML IJ SOLN
INTRAMUSCULAR | Status: DC | PRN
  Administered 2017-06-14 (×4): 50 ug via INTRAVENOUS

## 2017-06-14 MED ORDER — SODIUM CHLORIDE 0.9 % IV SOLN
INTRAVENOUS | Status: DC
  Administered 2017-06-14: 10 mL/h via INTRAVENOUS

## 2017-06-14 MED ORDER — DOCUSATE SODIUM 100 MG PO CAPS
100.0000 mg | ORAL_CAPSULE | Freq: Two times a day (BID) | ORAL | Status: DC
  Administered 2017-06-14 – 2017-06-16 (×4): 100 mg via ORAL
  Filled 2017-06-14 (×4): qty 1

## 2017-06-14 MED ORDER — FENTANYL CITRATE (PF) 100 MCG/2ML IJ SOLN
INTRAMUSCULAR | Status: AC
  Administered 2017-06-14: 50 ug via INTRAVENOUS
  Filled 2017-06-14: qty 2

## 2017-06-14 MED ORDER — CEFAZOLIN SODIUM-DEXTROSE 2-4 GM/100ML-% IV SOLN
2.0000 g | INTRAVENOUS | Status: DC
  Filled 2017-06-14: qty 100

## 2017-06-14 MED ORDER — PROPOFOL 10 MG/ML IV BOLUS
INTRAVENOUS | Status: AC
  Filled 2017-06-14: qty 20

## 2017-06-14 MED ORDER — ONDANSETRON HCL 4 MG PO TABS
4.0000 mg | ORAL_TABLET | Freq: Four times a day (QID) | ORAL | Status: DC | PRN
  Administered 2017-06-14 – 2017-06-16 (×2): 4 mg via ORAL
  Filled 2017-06-14 (×2): qty 1

## 2017-06-14 MED ORDER — MUPIROCIN 2 % EX OINT
1.0000 "application " | TOPICAL_OINTMENT | Freq: Two times a day (BID) | CUTANEOUS | Status: DC
  Administered 2017-06-14: 1 via TOPICAL

## 2017-06-14 MED ORDER — ONDANSETRON HCL 4 MG/2ML IJ SOLN
4.0000 mg | Freq: Four times a day (QID) | INTRAMUSCULAR | Status: DC | PRN
  Administered 2017-06-15 – 2017-06-16 (×2): 4 mg via INTRAVENOUS
  Filled 2017-06-14 (×2): qty 2

## 2017-06-14 MED ORDER — LIDOCAINE HCL (CARDIAC) 20 MG/ML IV SOLN
INTRAVENOUS | Status: DC | PRN
  Administered 2017-06-14: 60 mg via INTRAVENOUS

## 2017-06-14 MED ORDER — MAGNESIUM CITRATE PO SOLN: 1.0000 | Freq: Once | ORAL | Status: DC | PRN

## 2017-06-14 MED ORDER — BISACODYL 10 MG RE SUPP: 10.0000 mg | Freq: Every day | RECTAL | Status: DC | PRN

## 2017-06-14 MED ORDER — INSULIN ASPART 100 UNIT/ML ~~LOC~~ SOLN
5.0000 [IU] | Freq: Once | SUBCUTANEOUS | Status: AC
  Administered 2017-06-14: 5 [IU] via SUBCUTANEOUS

## 2017-06-14 MED ORDER — POLYETHYLENE GLYCOL 3350 17 G PO PACK: 17.0000 g | PACK | Freq: Every day | ORAL | Status: DC | PRN

## 2017-06-14 MED ORDER — DOCUSATE SODIUM 100 MG PO CAPS
100.0000 mg | ORAL_CAPSULE | Freq: Every day | ORAL | Status: DC
  Administered 2017-06-14: 100 mg via ORAL
  Filled 2017-06-14: qty 1

## 2017-06-14 MED ORDER — HYDROMORPHONE HCL 1 MG/ML IJ SOLN
1.0000 mg | INTRAMUSCULAR | Status: DC | PRN
  Administered 2017-06-14 – 2017-06-16 (×12): 1 mg via INTRAVENOUS
  Filled 2017-06-14 (×12): qty 1

## 2017-06-14 MED ORDER — LACTATED RINGERS IV SOLN
INTRAVENOUS | Status: DC
  Administered 2017-06-14 (×2): via INTRAVENOUS

## 2017-06-14 MED ORDER — FENTANYL CITRATE (PF) 100 MCG/2ML IJ SOLN
25.0000 ug | INTRAMUSCULAR | Status: DC | PRN
  Administered 2017-06-14 (×2): 50 ug via INTRAVENOUS

## 2017-06-14 MED ORDER — TRAZODONE HCL 100 MG PO TABS
100.0000 mg | ORAL_TABLET | Freq: Every day | ORAL | Status: DC
  Administered 2017-06-14 – 2017-06-15 (×2): 100 mg via ORAL
  Filled 2017-06-14: qty 2
  Filled 2017-06-14: qty 1

## 2017-06-14 SURGICAL SUPPLY — 22 items
BLADE SAW SGTL HD 18.5X60.5X1. (BLADE) ×2 IMPLANT
BLADE SURG 21 STRL SS (BLADE) ×2 IMPLANT
COVER SURGICAL LIGHT HANDLE (MISCELLANEOUS) ×2 IMPLANT
DRAPE INCISE IOBAN 66X45 STRL (DRAPES) ×2 IMPLANT
DRAPE U-SHAPE 47X51 STRL (DRAPES) ×2 IMPLANT
DURAPREP 26ML APPLICATOR (WOUND CARE) ×2 IMPLANT
ELECT REM PT RETURN 9FT ADLT (ELECTROSURGICAL) ×2
ELECTRODE REM PT RTRN 9FT ADLT (ELECTROSURGICAL) ×1 IMPLANT
GLOVE BIOGEL PI IND STRL 9 (GLOVE) ×1 IMPLANT
GLOVE BIOGEL PI INDICATOR 9 (GLOVE) ×1
GLOVE SURG ORTHO 9.0 STRL STRW (GLOVE) ×2 IMPLANT
GOWN STRL REUS W/ TWL XL LVL3 (GOWN DISPOSABLE) ×2 IMPLANT
GOWN STRL REUS W/TWL XL LVL3 (GOWN DISPOSABLE) ×2
KIT BASIN OR (CUSTOM PROCEDURE TRAY) ×2 IMPLANT
KIT ROOM TURNOVER OR (KITS) ×2 IMPLANT
NS IRRIG 1000ML POUR BTL (IV SOLUTION) ×2 IMPLANT
PACK ORTHO EXTREMITY (CUSTOM PROCEDURE TRAY) ×2 IMPLANT
PAD ARMBOARD 7.5X6 YLW CONV (MISCELLANEOUS) ×4 IMPLANT
SUT ETHILON 2 0 PSLX (SUTURE) ×4 IMPLANT
TOWEL OR 17X24 6PK STRL BLUE (TOWEL DISPOSABLE) ×2 IMPLANT
TUBE CONNECTING 12X1/4 (SUCTIONS) ×2 IMPLANT
YANKAUER SUCT BULB TIP NO VENT (SUCTIONS) ×2 IMPLANT

## 2017-06-14 NOTE — Progress Notes (Signed)
Initial Nutrition Assessment  DOCUMENTATION CODES:   Morbid obesity  INTERVENTION:    Advance diet as medically appropriate, add interventions when able  NUTRITION DIAGNOSIS:   Increased nutrient needs related to chronic illness, wound healing as evidenced by estimated needs  GOAL:   Patient will meet greater than or equal to 90% of their needs  MONITOR:   PO intake, Supplement acceptance, Labs, Weight trends, Skin, I & O's  REASON FOR ASSESSMENT:   Consult Assessment of nutrition requirement/status  ASSESSMENT:   56 yo Female with PMH of DM, PAD, paroxismal Afib, HTN, CHF and COPD. She is status post 2nd right metatarsal amputation on 6/15. She presented to [facility] on 06/13/17 with generalized malaise. The patient states that she started feeling unwell two weeks ago when she noticed purulent drainage coming from her surgical wound.   Pt yelling out in pain in her foot.  Admitted with cellulitis of her R foot s/p 2nd ray amputation. Orthopedics note reviewed. Pt NPO for transmetatarsal amputation today. Medications reviewed and include MVI, folvite and Vitamin B-12. CBG's [phone].  Unable to complete Nutrition-Focused physical exam at this time.   Diet Order:  Diet NPO time specified Diet NPO time specified  Skin:   R foot surgical wound   Last BM:  8/1  Height:   Ht Readings from Last 1 Encounters:  06/14/17 5\' 4"  (1.626 m)   Weight:   Wt Readings from Last 1 Encounters:  06/14/17 254 lb (115.2 kg)   Ideal Body Weight:  54.5 kg  BMI:  Body mass index is 43.6 kg/m.  Estimated Nutritional Needs:   Kcal:  1800-2000  Protein:  100-115 gm  Fluid:  1.8-2.0 L  EDUCATION NEEDS:   No education needs identified at this time  Arthur Holms, RD, LDN Pager #: [phone] After-Hours Pager #: [phone]

## 2017-06-14 NOTE — Progress Notes (Signed)
Patient complaining of indigestion. Anesthesiologist notified. No new orders at this time.

## 2017-06-14 NOTE — Anesthesia Procedure Notes (Signed)
Procedure Name: LMA Insertion Date/Time: 06/14/2017 1:12 PM Performed by: Izora Gala Pre-anesthesia Checklist: Patient identified, Emergency Drugs available, Suction available and Patient being monitored Patient Re-evaluated:Patient Re-evaluated prior to induction Oxygen Delivery Method: Circle system utilized Preoxygenation: Pre-oxygenation with 100% oxygen Induction Type: IV induction Ventilation: Mask ventilation without difficulty LMA: LMA inserted LMA Size: 4.0 Number of attempts: 1 Placement Confirmation: positive ETCO2 and breath sounds checked- equal and bilateral Dental Injury: Teeth and Oropharynx as per pre-operative assessment

## 2017-06-14 NOTE — Transfer of Care (Signed)
Immediate Anesthesia Transfer of Care Note  Patient: [name]  Procedure(s) Performed: Procedure(s): Right foot transmetatarsal amputation (Right)  Patient Location: PACU  Anesthesia Type:General  Level of Consciousness: awake, alert  and patient cooperative  Airway & Oxygen Therapy: Patient Spontanous Breathing and Patient connected to nasal cannula oxygen  Post-op Assessment: Report given to RN, Post -op Vital signs reviewed and stable and Patient moving all extremities  Post vital signs: Reviewed and stable  Last Vitals:  Vitals:   06/14/17 1131 06/14/17 1335  BP: (!) 127/52 (P) 132/65  Pulse: 78 (P) 76  Resp: 16 (P) 19  Temp:  (!) (P) 36.2 C    Last Pain:  Vitals:   06/14/17 1131  TempSrc:   PainSc: 9       Patients Stated Pain Goal: 3 (33/38/32 [id number])  Complications: No apparent anesthesia complications

## 2017-06-14 NOTE — Care Management Note (Addendum)
Case Management Note  Patient Details  Name: [name] MRN: [id number] Date of Birth: Jul 06, 1961  Subjective/Objective:  From home with sister LIDCVU [phone], sister states she will need to go to a SNF because she will not be able to assist her that much.   She has a rollator , rolling walker and w/ chair at home. She has had [facility] before with [facility] if she needs [facility] services she would prefer [facility] . She presents with sepsis secondary to osteomyelitis of right foot, she is post op right transmetatarsal amputation today. She has wound vac which is to be changed to prevena wound vac at dc and will follow up in office in 1 week to remove.   Await pt eval.   PT eval rec SNF. CSW aware.               Action/Plan: NCM will follow along with CSW  for dc needs.  Expected Discharge Date:                  Expected Discharge Plan:     In-House Referral:     Discharge planning Services  CM Consult  Post Acute Care Choice:    Choice offered to:     DME Arranged:    DME Agency:     HH Arranged:    HH Agency:     Status of Service:  In process, will continue to follow  If discussed at Long Length of Stay Meetings, dates discussed:    Additional Comments:  Zenon Mayo, RN 06/14/2017, 5:03 PM

## 2017-06-14 NOTE — Anesthesia Postprocedure Evaluation (Signed)
Anesthesia Post Note  Patient: [name]  Procedure(s) Performed: Procedure(s) (LRB): Right foot transmetatarsal amputation (Right)     Patient location during evaluation: PACU Anesthesia Type: General Level of consciousness: awake Pain management: pain level controlled Vital Signs Assessment: post-procedure vital signs reviewed and stable Respiratory status: spontaneous breathing Cardiovascular status: stable Anesthetic complications: no    Last Vitals:  Vitals:   06/14/17 1335 06/14/17 1345  BP: 132/65   Pulse: 76 77  Resp: 19 13  Temp: (!) 36.2 C     Last Pain:  Vitals:   06/14/17 1345  TempSrc:   PainSc: 10-Worst pain ever                 Lilyian Quayle

## 2017-06-14 NOTE — Progress Notes (Signed)
Report received from Angelica D., RN from [facility].

## 2017-06-14 NOTE — Op Note (Signed)
06/13/2017 - 06/14/2017  1:30 PM  PATIENT:  [name]    PRE-OPERATIVE DIAGNOSIS:  wound dehiscence of right 2nd ray amputation, osteomyelitis   POST-OPERATIVE DIAGNOSIS:  Same  PROCEDURE:  Right foot transmetatarsal amputation  SURGEON:  Newt Minion, MD  PHYSICIAN ASSISTANT:None ANESTHESIA:   General  PREOPERATIVE INDICATIONS:  [name] is a  56 y.o. female with a diagnosis of wound dehiscence of right 2nd ray amputation, osteomyelitis  who failed conservative measures and elected for surgical management.    The risks benefits and alternatives were discussed with the patient preoperatively including but not limited to the risks of infection, bleeding, nerve injury, cardiopulmonary complications, the need for revision surgery, among others, and the patient was willing to proceed.  OPERATIVE IMPLANTS: prevena wound vac  OPERATIVE FINDINGS: Good petechial bleeding  OPERATIVE PROCEDURE: Patient is a 56 year old woman diabetic insensate neuropathy peripheral vascular disease status post foot salvage intervention with a second Ray amputation. Patient has developed progressive wound dehiscence she is also developed osteomyelitis of the third metatarsal and presents at this time for transmetatarsal amputation. Risk and benefits were discussed including risk of the wound not healing need for higher level amputation. Patient states she understands wishes to proceed at this time. Patient was brought to the operating room and underwent a general anesthetic. After adequate levels of anesthesia were obtained patient's right lower extremity was prepped using DuraPrep draped into a sterile field a timeout was called. A fishmouth incision was made just proximal to the dehisced wound. A reciprocating saw was used to perform a transmetatarsal amputation. Electrocautery was used for hemostasis. There was good petechial bleeding there was no abscess. The wound was irrigated with normal saline. The  incision was closed using 2-0 nylon. A Prevena wound VAC was applied. Patient was extubated taken to the PACU in stable condition.

## 2017-06-14 NOTE — Anesthesia Postprocedure Evaluation (Signed)
Anesthesia Post Note  Patient: [name]  Procedure(s) Performed: Procedure(s) (LRB): Right foot transmetatarsal amputation (Right)     Patient location during evaluation: PACU Anesthesia Type: General Level of consciousness: awake Pain management: pain level controlled Vital Signs Assessment: post-procedure vital signs reviewed and stable Respiratory status: spontaneous breathing Cardiovascular status: stable Anesthetic complications: no    Last Vitals:  Vitals:   06/14/17 1436 06/14/17 1557  BP: 117/62 (!) 147/66  Pulse: 77   Resp: 13 15  Temp:  36.5 C    Last Pain:  Vitals:   06/14/17 1557  TempSrc: Axillary  PainSc:                  Wendolyn Raso

## 2017-06-14 NOTE — Progress Notes (Addendum)
  Subjective: Today she was seen and examined at bedside.Orthopedic Surgery to perform metatarsal amputation of right 3rd metarsal today. She reported that she has severe pain present in her left leg and hip but no pain in her right foot. She states that the swelling in her right leg has improved since yesterday.  She denies any shortness of breath,chest pain, nausea/vomitting, or new onset-weakness.   Objective: Vital signs in last 24 hours: Vitals:   06/14/17 1335 06/14/17 1345 06/14/17 1354 06/14/17 1400  BP: 132/65 130/71 130/71   Pulse: 76 77 77 77  Resp: 19 13 13 15   Temp: (!) 97.2 F (36.2 C)     TempSrc:      SpO2: 100% 98% 97% 98%  Weight:      Height:       BP (!) 147/66 (BP Location: Left Arm)   Pulse 77   Temp 97.7 F (36.5 C) (Axillary)   Resp 15   Ht 5\' 4"  (1.626 m)   Wt 115.2 kg (254 lb)   LMP 11/27/2012   SpO2 100%   BMI 43.60 kg/m  General appearance: alert, cooperative and moderately obese Head: Normocephalic, without obvious abnormality, atraumatic Lungs: clear to auscultation bilaterally Heart: regular rate and rhythm, S1, S2 normal, no murmur, click, rub or gallop Abdomen: soft, non-tender; bowel sounds normal; no masses,  no organomegaly Extremities: Non-healing wound present over 2nd metatarsal amputation stump. No purlence expressed. Warmth presnet over right lower leg. Erythema over right leg resolved.  Skin: Skin color, texture, turgor normal. No rashes or lesions or Chronic venous statsis changes present over both legs  Assessment/Plan:  Sepsis secondary to osteomyelitis of 3rd metatarsal   -Patient remained febrile,Tmax 102.5, overnight -WBC was 15.8 today -MRI confirmed diagnosis of osteomyelitis of 3rd metatarsal.  -Orthopedic Surgery to perform 3rd metatarsal amputation today  -Continue Vancomycin and Zosyn -blood cultures drawn, NG x 1 day. Unfortunately no culture data from previous surgery 6/15 to guide ABX -Monitoring Vitals Q4hr and CBC  daily   Diabetes Mellitus - sliding scale insulin and 30u lantus qhs --Accu check every 4hr and before meals    Paroxsymal Afib  -Present upon admission  -Upon admission patient was in sinus rhythm  -anticoagulation (eliquis) held for possible ortho procedure. Resume in am if OK per ortho -Continue amiodarone    Hypertension   -Holding home BP medications given sepsis and pressure consistently below 120/80    AKI on CKD (Stage III)  -Resolved, Creatinine was 1.32 this morning which was around her baseline  Chronic Systolic CHF EF 16-10% -Present upon admission,stable  - Continue Atrovastatin  -Holding Torosemide and lisinopril due to AKI     This is a Careers information officer Note.  The care of the patient was discussed with Dr.Bosewell  and the assessment and plan formulated with their assistance.  Please see their attached note for official documentation of the daily encounter.   LOS: 1 day   Avva, Juanda Chance, Medical Student 06/14/2017, 2:09 PM   Attestation for Student Documentation:  I personally was present and performed or re-performed the history, physical exam and medical decision-making activities of this service and have verified that the service and findings are accurately documented in the student's note.  Maryellen Pile, MD 06/14/2017, 6:12 PM

## 2017-06-14 NOTE — Progress Notes (Signed)
Dr. Linna Caprice made aware of current CBG. New orders received.

## 2017-06-14 NOTE — Consult Note (Signed)
ORTHOPAEDIC CONSULTATION  REQUESTING PHYSICIAN: Oval Linsey, MD  Chief Complaint: Cellulitis and pain right foot status post second ray amputation  HPI: [name] is a 57 y.o. female who presents with increasing cellulitis and pain right foot. Patient is status post second ray amputation with dehiscence of the wound.  Past Medical History:  Diagnosis Date  . Alcohol abuse   . Alcoholic cirrhosis ([place])   . Anemia   . Anxiety   . B12 deficiency   . CAD (coronary artery disease)    a. cath 11/10/2014 LM nl, LAD min irregs, D1 30 ost, D2 50d, LCX 39m, OM1 80 p/m (1.5 mm vessel), OM2 83m, RCA nondom 44m-->med rx.. Demand ischemia in the setting of rapid a-fib.  . Cardiomyopathy ([place])   . Carotid artery disease ([place])    a. 01/2015 Carotid Angio: RICA 517, LICA 61Y. s/p L carotid endarterectomy 02/2015.  [name] Chronic combined systolic and diastolic CHF (congestive heart failure) ([place])    a. Last echo 12/205: EF 40-45%, inferoapical/posterior HK, not technically sufficient to allow eval of LV diastolic function, normal LA in size..  . Cocaine abuse   . COPD (chronic obstructive pulmonary disease) ([place])   . Critical lower limb ischemia   . Depression   . Diabetic peripheral neuropathy ([place])   . Elevated troponin    a. Chronic elevation.  [name] GERD (gastroesophageal reflux disease)   . Hyperlipemia   . Hypertension   . Hypokalemia   . Hypomagnesemia   . Hypothyroidism   . Marijuana abuse   . Narcotic abuse   . Noncompliance   . NSVT (nonsustained ventricular tachycardia) ([place])   . Obesity   . PAF (paroxysmal atrial fibrillation) ([place])   . Paroxysmal atrial tachycardia ([place])   . Paroxysmal atrial tachycardia ([place])   . Peripheral arterial disease ([place])    a. 01/2015 Angio/PTA: LSFA 100 w/ recon @ adductor canal and 1 vessel runoff via AT, RSFA 99 (atherectomy/pta) - 1 vessel runoff via diff dzs peroneal.  . Poorly controlled type 2 diabetes mellitus ([place])   . Renal  insufficiency    a. 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  [name] Symptomatic bradycardia    avoid AV blocking agent per EP  . Tobacco abuse    Past Surgical History:  Procedure Laterality Date  . AMPUTATION TOE Right 04/28/2017   Procedure: AMPUTATION OF RIGHT SECOND RAY;  Surgeon: Newt Minion, MD;  Location: [facility];  Service: Orthopedics;  Laterality: Right;  . BALLOON ANGIOPLASTY, ARTERY Right 01/15/2015   SFA/notes 01/15/2015  . CARDIAC CATHETERIZATION    . CARDIAC CATHETERIZATION N/A 01/12/2016   Procedure: Temporary Wire;  Surgeon: Minus Breeding, MD;  Location: [facility];  Service: Cardiovascular;  Laterality: N/A;  . CARDIOVERSION  ~ 02/2013   "twice"   . CAROTID ANGIOGRAM N/A 01/15/2015   Procedure: CAROTID ANGIOGRAM;  Surgeon: [name], MD;  Location: [facility] CATH LAB;  Service: Cardiovascular;  Laterality: N/A;  . DILATION AND CURETTAGE OF UTERUS  1988  . ENDARTERECTOMY Left 02/19/2015   Procedure: LEFT CAROTID ENDARTERECTOMY ;  Surgeon: Serafina Mitchell, MD;  Location: [facility];  Service: Vascular;  Laterality: Left;  . LEFT HEART CATHETERIZATION WITH CORONARY ANGIOGRAM N/A 10/31/2014   Procedure: LEFT HEART CATHETERIZATION WITH CORONARY ANGIOGRAM;  Surgeon: Burnell Blanks, MD;  Location: [facility] CATH LAB;  Service: Cardiovascular;  Laterality: N/A;  . LOWER EXTREMITY ANGIOGRAM N/A 09/10/2013   Procedure: LOWER EXTREMITY ANGIOGRAM;  Surgeon: [name], MD;  Location: [facility] CATH LAB;  Service: Cardiovascular;  Laterality: N/A;  . LOWER EXTREMITY ANGIOGRAM N/A 01/15/2015   Procedure: LOWER EXTREMITY ANGIOGRAM;  Surgeon: [name], MD;  Location[facility];  Service: Cardiovascular;  Laterality: N/A;  . LOWER EXTREMITY ANGIOGRAPHY N/A 04/13/2017   Procedure: Lower Extremity Angiography;  Surgeon: [name], MD;  Location: [place] CV LAB;  Service: Cardiovascular;  Laterality: N/A;  . PERIPHERAL ATHRECTOMY Right 01/15/2015   SFA/notes 01/15/2015  . PERIPHERAL VASCULAR  INTERVENTION  04/13/2017   Procedure: Peripheral Vascular Intervention;  Surgeon: [name], MD;  Location: [facility] CV LAB;  Service: Cardiovascular;;   Social History   Social History  . Marital status: Divorced    Spouse name: N/A  . Number of children: N/A  . Years of education: N/A   Occupational History  . disabled    Social History Main Topics  . Smoking status: Former Smoker    Packs/day: 0.00    Years: 40.00    Types: E-cigarettes    Quit date: 04/05/2017  . Smokeless tobacco: Former Systems developer    Types: Snuff  . Alcohol use No     Comment: former alcohol abuse  . Drug use: No     Comment: 04/29/2015 "last drug use was ~ 09/08/2013"  . Sexual activity: No   Other Topics Concern  . None   Social History Narrative   Lives in [place], in [place] with sister.  They are looking to move but don't have a place to go yet.     Family History  Problem Relation Age of Onset  . Hypertension Mother   . Diabetes Mother   . Cancer Mother        breast, ovarian, colon  . Clotting disorder Mother   . Heart disease Mother   . Heart attack Mother   . Breast cancer Mother   . Hypertension Father   . Heart disease Father   . Emphysema Sister        smoked   - negative except otherwise stated in the family history section Allergies  Allergen Reactions  . Gabapentin Nausea And Vomiting    POSSIBLE SHAKING? TAKING MED CURRENTLY  . Lyrica [Pregabalin] Other (See Comments)    shaking   Prior to Admission medications   Medication Sig Start Date End Date Taking? Authorizing Provider  albuterol (PROVENTIL HFA;VENTOLIN HFA) 108 (90 Base) MCG/ACT inhaler Inhale 2 puffs into the lungs every 6 (six) hours as needed for wheezing or shortness of breath. 03/01/16  Yes Arnoldo Morale, MD  amiodarone (PACERONE) 100 MG tablet Take 1 tablet (100 mg total) by mouth daily. 04/17/17  Yes Bensimhon, Shaune Pascal, MD  amLODipine (NORVASC) 10 MG tablet TAKE ONE TABLET BY MOUTH ONCE DAILY 04/28/17  Yes  Bensimhon, Shaune Pascal, MD  atorvastatin (LIPITOR) 40 MG tablet TAKE 1 TABLET BY MOUTH ONCE EVERY EVENING 04/17/17  Yes Bensimhon, Shaune Pascal, MD  BELBUCA 75 MCG FILM Place 1 Film inside cheek 2 (two) times daily.  04/21/17  Yes [provider]  bisoprolol (ZEBETA) 5 MG tablet TAKE ONE-HALF ( 2.5MG  TOTAL) TABLET BY MOUTH DAILY Patient taking differently: TAKE ONE-HALF TAB (2.5MG  TOTAL) BY MOUTH EVERY EVENING 04/28/17  Yes Bensimhon, Shaune Pascal, MD  cholecalciferol (VITAMIN D) 1000 units tablet Take 1,000 Units by mouth daily.   Yes [provider]  clopidogrel (PLAVIX) 75 MG tablet Take 1 tablet (75 mg total) by mouth daily with breakfast. 05/11/17  Yes [name],  MD  cyanocobalamin 1000 MCG tablet Take 1 tablet (1,000 mcg total) by mouth daily. 05/11/16  Yes Simmons, Brittainy M, PA-C  folic acid (FOLVITE) 1 MG tablet TAKE 1 TABLET BY MOUTH EVERY DAY 11/30/16  Yes [name], MD  insulin aspart (NOVOLOG FLEXPEN) 100 UNIT/ML FlexPen Inject 25 Units into the skin 3 (three) times daily with meals. 04/14/17  Yes Ghimire, Henreitta Leber, MD  Insulin Glargine (LANTUS SOLOSTAR) 100 UNIT/ML Solostar Pen Inject 48 Units into the skin daily at 10 pm. 04/14/17  Yes Ghimire, Henreitta Leber, MD  levothyroxine (SYNTHROID, LEVOTHROID) 50 MCG tablet Take 1 tablet (50 mcg total) by mouth daily. 06/30/15  Yes Arnoldo Morale, MD  losartan (COZAAR) 25 MG tablet TAKE ONE TABLET BY MOUTH ONCE DAILY 04/28/17  Yes Bensimhon, Shaune Pascal, MD  metolazone (ZAROXOLYN) 2.5 MG tablet TAKE 1 TABLE EVERY WEEK ON THURSDAY. MAY TAKE ONE ADDITIONAL TABLET AS DIRECTED BY HEART FAILURE CLINIC 05/31/17  Yes Bensimhon, Shaune Pascal, MD  Multiple Vitamin (MULTIVITAMIN) tablet Take 1 tablet by mouth daily.   Yes [provider]  oxyCODONE-acetaminophen (ROXICET) 5-325 MG tablet Take 1 tablet by mouth every 8 (eight) hours as needed for severe pain. 05/22/17  Yes Newt Minion, MD  potassium chloride SA (K-DUR,KLOR-CON) 20 MEQ tablet Take  1 tablet (20 mEq total) by mouth daily. Take an additional 20 meq (1 tab) on Thursday with metolazone 04/17/17  Yes Bensimhon, Shaune Pascal, MD  pregabalin (LYRICA) 100 MG capsule Take 100 mg by mouth 3 (three) times daily.   Yes [provider]  spironolactone (ALDACTONE) 25 MG tablet Take 1 tablet (25 mg total) by mouth daily. 04/17/17  Yes Bensimhon, Shaune Pascal, MD  SYMBICORT 160-4.5 MCG/ACT inhaler Inhale 2 puffs into the lungs 2 (two) times daily. 03/28/17  Yes [provider]  traZODone (DESYREL) 100 MG tablet Take 100 mg by mouth at bedtime.   Yes [provider]  apixaban (ELIQUIS) 5 MG TABS tablet Take 1 tablet (5 mg total) by mouth 2 (two) times daily. Patient not taking: Reported on 06/13/2017 04/14/17   Jonetta Osgood, MD  torsemide (DEMADEX) 20 MG tablet Take 2 tablets (40 mg total) by mouth 2 (two) times daily. Patient not taking: Reported on 06/13/2017 04/17/17   Bensimhon, Shaune Pascal, MD   Dg Chest 2 View  Result Date: 06/13/2017 CLINICAL DATA:  Fever EXAM: CHEST  2 VIEW COMPARISON:  04/07/2017 FINDINGS: Heart and mediastinal contours are within normal limits. No focal opacities or effusions. No acute bony abnormality. IMPRESSION: No active cardiopulmonary disease. Electronically Signed   By: Rolm Baptise M.D.   On: 06/13/2017 09:56   Mr Foot Right Wo Contrast  Result Date: 06/13/2017 CLINICAL DATA:  Status post amputation of the right second toe and distal metatarsal on 04/28/2017 with purulent drainage from the surgical wound. Clinical evidence of sepsis. Findings concerning for possible osteomyelitis involving the base of the third proximal phalanx on right foot radiographs obtained earlier today. There is also a fracture of the stump of the right second metatarsal on the radiographs. Diabetes. EXAM: MRI OF THE RIGHT FOREFOOT WITHOUT CONTRAST TECHNIQUE: Multiplanar, multisequence MR imaging of the right foot was performed. No intravenous contrast was administered.  COMPARISON:  Right foot radiographs obtained earlier today and on 04/06/2017. FINDINGS: Bones/Joint/Cartilage Limited by artifacts produced by patient motion and lack of intravenous contrast. There is edema in the distal aspect of the second metatarsal stump at the location of the recently demonstrated fracture. There  is also edema in the third metatarsal head an neck with some focal lucency in the lateral aspect of the third metatarsal head on the recent radiographs. There is also mild edema in the laterally subluxed third proximal phalanx. Ligaments Not well assessed. Muscles and Tendons Diffuse muscular edema. Soft tissues Diffuse edema. IMPRESSION: 1. Limited examination by patient motion and lack of intravenous contrast. 2. Findings suspicious for osteomyelitis involving the third metatarsal head and neck and possibly involving the proximal aspect of the third proximal phalanx. 3. Edema in the distal aspect of the second metatarsal stump. This is compatible with edema associated with the fracture at that location. 4. Diffuse soft tissue edema. Electronically Signed   By: Claudie Revering M.D.   On: 06/13/2017 18:15   Dg Foot Complete Right  Result Date: 06/13/2017 CLINICAL DATA:  Nonhealing wound of the left foot. Status post amputation of the second toe and distal second metatarsal. EXAM: RIGHT FOOT COMPLETE - 3+ VIEW COMPARISON:  Radiographs dated 04/06/2017 and 03/21/2017 FINDINGS: There is a fracture of the stump of the second metatarsal. There is some periosteal reaction. There is new subluxation at the third and MTP joint into lucency in the head of the third metatarsal with slight irregularity of the base of the proximal phalanx of the third toe. Slight chronic degenerative changes of the first MTP joint. IMPRESSION: 1. Fracture of the stump of the second metatarsal. 2. New subluxation at the third MTP joint. 3. Slight irregularity of the cortex of the base of the proximal phalanx of the third toe which  could represent osteomyelitis. Electronically Signed   By: Lorriane Shire M.D.   On: 06/13/2017 10:01   - pertinent xrays, CT, MRI studies were reviewed and independently interpreted  Positive ROS: All other systems have been reviewed and were otherwise negative with the exception of those mentioned in the HPI and as above.  Physical Exam: General: Alert, no acute distress Psychiatric: Patient is competent for consent with normal mood and affect Lymphatic: No axillary or cervical lymphadenopathy Cardiovascular: No pedal edema Respiratory: No cyanosis, no use of accessory musculature GI: No organomegaly, abdomen is soft and non-tender  Skin: Patient has cellulitis and dermatitis extending up her calf. There is necrotic tissue within the wound bed secondary amputation with dehiscence of the wound.   Neurologic: Patient does not have protective sensation bilateral lower extremities.   MUSCULOSKELETAL:  Patient does not have a palpable pulse due to the swelling. Review of the MRI scan shows osteomyelitis involving the third metatarsal head. There is also edema in the base of the second metatarsal. This may be osteomyelitis as well.  Assessment: Assessment: Diabetic insensate neuropathy peripheral vascular disease with new osteomyelitis of the third metatarsal head and third toe with most likely osteomyelitis of the second metatarsal with wound dehiscence of the second Ray amputation  Plan: Plan: We'll plan for transmetatarsal amputation today. Risks and benefits were discussed including persistent infection nonhealing of the wound need for additional surgery. Patient states she understands wishes to proceed at this time.  Thank you for the consult and the opportunity to see Ms. Tressa Busman, MD [facility] [phone] 6:41 AM

## 2017-06-14 NOTE — Anesthesia Preprocedure Evaluation (Signed)
Anesthesia Evaluation  Patient identified by MRN, date of birth, ID band Patient awake    Reviewed: Allergy & Precautions, NPO status , Patient's Chart, lab work & pertinent test results  Airway Mallampati: II  TM Distance: >3 FB     Dental   Pulmonary former smoker,    breath sounds clear to auscultation       Cardiovascular hypertension, + CAD, + Peripheral Vascular Disease, +CHF and + DOE   Rhythm:Regular Rate:Normal     Neuro/Psych  Neuromuscular disease    GI/Hepatic Neg liver ROS, GERD  ,  Endo/Other  diabetesHypothyroidism   Renal/GU Renal disease     Musculoskeletal   Abdominal   Peds  Hematology  (+) anemia ,   Anesthesia Other Findings   Reproductive/Obstetrics                             Anesthesia Physical Anesthesia Plan  ASA: IV  Anesthesia Plan: General   Post-op Pain Management:    Induction: Intravenous  PONV Risk Score and Plan: 3 and Ondansetron, Dexamethasone, Midazolam, Propofol infusion and Treatment may vary due to age or medical condition  Airway Management Planned: LMA  Additional Equipment:   Intra-op Plan:   Post-operative Plan: Extubation in OR  Informed Consent: I have reviewed the patients History and Physical, chart, labs and discussed the procedure including the risks, benefits and alternatives for the proposed anesthesia with the patient or authorized representative who has indicated his/her understanding and acceptance.   Dental advisory given  Plan Discussed with: Anesthesiologist and CRNA  Anesthesia Plan Comments:         Anesthesia Quick Evaluation

## 2017-06-14 NOTE — H&P (Signed)
Internal Medicine Attending Admission Note Date: 06/14/2017  Patient name: [name] Medical record number: [id number] Date of birth: 1961-01-09 Age: 56 y.o. Gender: female  I saw and evaluated the patient. I reviewed the resident's note and I agree with the resident's findings and plan as documented in the resident's note.  Chief Complaint(s): Nausea, vomiting, and generalized malaise 2 weeks associated with a poorly healing right second metatarsal amputation wound with purulent drainage.  History - key components related to admission:  [name] is a 56 year old woman with a history of poorly controlled diabetes complicated by peripheral neuropathy, peripheral vascular occlusive disease, morbid obesity, chronic renal insufficiency, coronary artery disease, and polysubstance abuse who presents with a two-week history of generalized malaise associated with nausea, vomiting, and a poorly healing right second metatarsal amputation wound with purulent drainage. Upon presentation to the emergency department she was found to have radiographic evidence of a third metatarsal head osteomyelitis and associated wound dehiscence from the second toe amputation performed on 04/28/2017. This was associated with a mild tachycardia and low-grade temperature and a leukocytosis. She was therefore admitted to the internal medicine teaching service for further evaluation prior to an anticipated third metatarsal head amputation on the right.  When seen on rounds the morning after admission she noted some improvement in the pain and warmth in her lower extremities but pain in the left buttock area. This began after her MRI scan. She subsequently underwent a right foot metatarsal amputation.  Physical Exam - key components related to admission:  Vitals:   06/14/17 1557 06/14/17 1919 06/14/17 2023 06/14/17 2331  BP: (!) 147/66 131/80  (!) 143/96  Pulse:  90  98  Resp: 15 11  18   Temp: 97.7 F (36.5 C) 97.9 F  (36.6 C)  98.7 F (37.1 C)  TempSrc: Axillary Oral  Oral  SpO2: 100% 95% 98%   Weight:      Height:       Gen.: Well-developed, well-nourished, morbidly obese woman lying comfortably in bed in no acute distress. She had some discomfort in the left buttock area with movement. Left buttock: Bruising over the left buttock that is focal and not associated with a palpable hematoma or skin break. Extremities: Bilateral lower extremity chronic venous stasis skin changes with hemosiderin deposition. Left medial superior malleolus ulcer consistent with a chronic venous stasis ulcer. Right second metatarsal head wound dehiscence with mild purulence at the base. No acute erythema or warmth.  Lab results:  Basic Metabolic Panel:  Recent Labs  06/13/17 0910 06/14/17 0231  NA 134* 134*  K 4.7 4.3  CL 94* 102  CO2 26 26  GLUCOSE 332* 287*  BUN 56* 35*  CREATININE 1.87* 1.35*  CALCIUM 9.1 8.0*   Liver Function Tests:  Recent Labs  06/13/17 0910 06/14/17 0231  AST 42* 26  ALT 36 28  ALKPHOS 137* 115  BILITOT 0.5 0.6  PROT 7.2 5.9*  ALBUMIN 3.0* 2.3*   CBC:  Recent Labs  06/13/17 0910 06/14/17 0231  WBC 23.2* 15.8*  NEUTROABS 20.8*  --   HGB 10.4* 9.2*  HCT 32.5* 29.9*  MCV 89.8 91.2  PLT 315 225   CBG:  Recent Labs  06/14/17 1044 06/14/17 1149 06/14/17 1259 06/14/17 1339 06/14/17 1556 06/14/17 2114  GLUCAP 301* 271* 208* 180* 176* 373*   Urinalysis:  Hazy, yellow, specific gravity 1.012, pH 5.0, glucose 50, negative protein, negative nitrite, negative leukocytes.  Misc. Labs:  Blood cultures 2 no growth 1 day  Urine culture no growth Lactic acid 4.73 -> 2.74 -> 1.6 FOBT positive  Imaging results:  Dg Chest 2 View  Result Date: 06/13/2017 CLINICAL DATA:  Fever EXAM: CHEST  2 VIEW COMPARISON:  04/07/2017 FINDINGS: Heart and mediastinal contours are within normal limits. No focal opacities or effusions. No acute bony abnormality. IMPRESSION: No active  cardiopulmonary disease. Electronically Signed   By: Rolm Baptise M.D.   On: 06/13/2017 09:56   Mr Foot Right Wo Contrast  Result Date: 06/13/2017 CLINICAL DATA:  Status post amputation of the right second toe and distal metatarsal on 04/28/2017 with purulent drainage from the surgical wound. Clinical evidence of sepsis. Findings concerning for possible osteomyelitis involving the base of the third proximal phalanx on right foot radiographs obtained earlier today. There is also a fracture of the stump of the right second metatarsal on the radiographs. Diabetes. EXAM: MRI OF THE RIGHT FOREFOOT WITHOUT CONTRAST TECHNIQUE: Multiplanar, multisequence MR imaging of the right foot was performed. No intravenous contrast was administered. COMPARISON:  Right foot radiographs obtained earlier today and on 04/06/2017. FINDINGS: Bones/Joint/Cartilage Limited by artifacts produced by patient motion and lack of intravenous contrast. There is edema in the distal aspect of the second metatarsal stump at the location of the recently demonstrated fracture. There is also edema in the third metatarsal head an neck with some focal lucency in the lateral aspect of the third metatarsal head on the recent radiographs. There is also mild edema in the laterally subluxed third proximal phalanx. Ligaments Not well assessed. Muscles and Tendons Diffuse muscular edema. Soft tissues Diffuse edema. IMPRESSION: 1. Limited examination by patient motion and lack of intravenous contrast. 2. Findings suspicious for osteomyelitis involving the third metatarsal head and neck and possibly involving the proximal aspect of the third proximal phalanx. 3. Edema in the distal aspect of the second metatarsal stump. This is compatible with edema associated with the fracture at that location. 4. Diffuse soft tissue edema. Electronically Signed   By: Claudie Revering M.D.   On: 06/13/2017 18:15   Dg Foot Complete Right  Result Date: 06/13/2017 CLINICAL DATA:   Nonhealing wound of the left foot. Status post amputation of the second toe and distal second metatarsal. EXAM: RIGHT FOOT COMPLETE - 3+ VIEW COMPARISON:  Radiographs dated 04/06/2017 and 03/21/2017 FINDINGS: There is a fracture of the stump of the second metatarsal. There is some periosteal reaction. There is new subluxation at the third and MTP joint into lucency in the head of the third metatarsal with slight irregularity of the base of the proximal phalanx of the third toe. Slight chronic degenerative changes of the first MTP joint. IMPRESSION: 1. Fracture of the stump of the second metatarsal. 2. New subluxation at the third MTP joint. 3. Slight irregularity of the cortex of the base of the proximal phalanx of the third toe which could represent osteomyelitis. Electronically Signed   By: Lorriane Shire M.D.   On: 06/13/2017 10:01   Other results:  EKG: Personally reviewed. Normal sinus rhythm at 100 bpm, normal axis, normal intervals, no significant Q waves, no LVH by voltage, good R wave progression, no ST or T-wave changes. This ECG is unchanged from the previous ECG on 04/14/2017.  Assessment & Plan by Problem:  Ms. Stallone is a 56 year old woman with a history of poorly controlled diabetes complicated by peripheral neuropathy, peripheral vascular occlusive disease, morbid obesity, chronic renal insufficiency, coronary artery disease, and polysubstance abuse who presents with a two-week history of generalized malaise  associated with nausea, vomiting, and a poorly healing right second metatarsal amputation wound with purulent drainage. She was found to have osteomyelitis of the third right metatarsal head and subsequently underwent a transmetatarsal amputation today.  1) Status post transmetatarsal amputation on the right for acute third metatarsal head osteomyelitis: We are continuing the antibiotics and will clarify with surgery the recommended postoperative course and rehabilitation required. Given  her living situation and lack of social support she will likely require some assistance including up to a possible skilled nursing facility placement. While here we will review the medical record and assess, as best we can, her vascular status to the lower extremities to assure she has adequate blood flow moving forward. We will also repeat a vitamin B12 level to make sure she has adequate replacement as we do not want to miss this potential cause of her peripheral neuropathy and blame it solely on poorly controlled diabetes if in fact it is intervenable.  2) Fecal occult test positive: She will be referred to GI as an outpatient to undergo a colonoscopy given her positive FOBT.  3) Acute on chronic renal insufficiency: Likely related to prerenal azotemia secondary to her 2 weeks of nausea and vomiting with poor oral intake. She was rehydrated with improvement in her creatinine back to baseline. No further evaluation is necessary.  4) Disposition: I anticipate she will be ready for discharge home or to a skilled nursing facility as appropriate within the next 24-48 hours.

## 2017-06-14 NOTE — Progress Notes (Signed)
Inpatient Diabetes Program Recommendations  AACE/ADA: New Consensus Statement on Inpatient Glycemic Control (2015)  Target Ranges:  Prepandial:   less than 140 mg/dL      Peak postprandial:   less than 180 mg/dL (1-2 hours)      Critically ill patients:  140 - 180 mg/dL   Results for [name], [name] (MRN [id number]) as of 06/14/2017 12:55  Ref. Range 06/13/2017 17:23 06/13/2017 18:13 06/13/2017 19:21 06/14/2017 10:44 06/14/2017 11:49  Glucose-Capillary Latest Ref Range: 65 - 99 mg/dL 315 (H) 308 (H) 285 (H) 301 (H) 271 (H)   Review of Glycemic Control  Diabetes history: DM 2 Outpatient Diabetes medications: Lantus 48 units, Novolog 25 units tid meal coverage Current orders for Inpatient glycemic control: Lantus 30 units, Novolog Resistant 0-20 units tid  Inpatient Diabetes Program Recommendations:    Consider A1c level to determine glucose control over the past 2-3 months.   Glucose elevated in the 300's this am with Lantus 30 units. Patient on more insulin at home.  Please consider increasing Lantus to 40 units, also consider a portion of patient's meal coverage when diet is resumed and patient is consuming at least 50% of meals, Novolog 6 units tid.  Thanks,  Tama Headings RN, MSN, [facility] Inpatient Diabetes Coordinator Team Pager [phone] (8a-5p)

## 2017-06-14 NOTE — Progress Notes (Signed)
Orthopedic Tech Progress Note Patient Details:  [name] 08-17-1961 [id number]  Ortho Devices Type of Ortho Device: Postop shoe/boot Ortho Device/Splint Location: RLE Ortho Device/Splint Interventions: Ordered, Application   [name] 06/14/2017, 3:35 PM

## 2017-06-15 ENCOUNTER — Inpatient Hospital Stay (HOSPITAL_COMMUNITY): Admission: RE | Admit: 2017-06-15 | Payer: Medicare Other | Source: Ambulatory Visit | Admitting: Cardiology

## 2017-06-15 ENCOUNTER — Encounter (HOSPITAL_COMMUNITY): Payer: Self-pay | Admitting: Orthopedic Surgery

## 2017-06-15 DIAGNOSIS — Z89421 Acquired absence of other right toe(s): Secondary | ICD-10-CM

## 2017-06-15 LAB — CBC WITH DIFFERENTIAL/PLATELET
BASOS PCT: 0 %
Basophils Absolute: 0 10*3/uL (ref 0.0–0.1)
EOS ABS: 0.1 10*3/uL (ref 0.0–0.7)
EOS PCT: 1 %
HCT: 29.8 % — ABNORMAL LOW (ref 36.0–46.0)
Hemoglobin: 9.1 g/dL — ABNORMAL LOW (ref 12.0–15.0)
LYMPHS ABS: 1.7 10*3/uL (ref 0.7–4.0)
Lymphocytes Relative: 15 %
MCH: 28.3 pg (ref 26.0–34.0)
MCHC: 30.5 g/dL (ref 30.0–36.0)
MCV: 92.8 fL (ref 78.0–100.0)
MONOS PCT: 8 %
Monocytes Absolute: 0.9 10*3/uL (ref 0.1–1.0)
Neutro Abs: 8.7 10*3/uL — ABNORMAL HIGH (ref 1.7–7.7)
Neutrophils Relative %: 76 %
PLATELETS: 200 10*3/uL (ref 150–400)
RBC: 3.21 MIL/uL — AB (ref 3.87–5.11)
RDW: 15.7 % — ABNORMAL HIGH (ref 11.5–15.5)
WBC: 11.4 10*3/uL — AB (ref 4.0–10.5)

## 2017-06-15 LAB — GLUCOSE, CAPILLARY
GLUCOSE-CAPILLARY: 205 mg/dL — AB (ref 65–99)
Glucose-Capillary: 276 mg/dL — ABNORMAL HIGH (ref 65–99)
Glucose-Capillary: 260 mg/dL — ABNORMAL HIGH (ref 65–99)
Glucose-Capillary: 256 mg/dL — ABNORMAL HIGH (ref 65–99)

## 2017-06-15 LAB — BASIC METABOLIC PANEL
Anion gap: 7 (ref 5–15)
BUN: 19 mg/dL (ref 6–20)
CO2: 26 mmol/L (ref 22–32)
CREATININE: 1.18 mg/dL — AB (ref 0.44–1.00)
Calcium: 8.1 mg/dL — ABNORMAL LOW (ref 8.9–10.3)
Chloride: 103 mmol/L (ref 101–111)
GFR, EST AFRICAN AMERICAN: 59 mL/min — AB (ref >60)
GFR, EST NON AFRICAN AMERICAN: 51 mL/min — AB (ref >60)
Glucose, Bld: 206 mg/dL — ABNORMAL HIGH (ref 65–99)
Potassium: 4.5 mmol/L (ref 3.5–5.1)
SODIUM: 136 mmol/L (ref 135–145)

## 2017-06-15 LAB — VITAMIN B12: Vitamin B-12: 1423 pg/mL — ABNORMAL HIGH (ref 180–914)

## 2017-06-15 MED ORDER — HYDROCODONE-ACETAMINOPHEN 10-325 MG PO TABS
1.0000 | ORAL_TABLET | ORAL | Status: DC | PRN
  Administered 2017-06-15 (×2): 2 via ORAL
  Filled 2017-06-15 (×2): qty 2

## 2017-06-15 MED ORDER — ALPRAZOLAM 0.5 MG PO TABS
0.5000 mg | ORAL_TABLET | Freq: Once | ORAL | Status: AC | PRN
  Administered 2017-06-15: 0.5 mg via ORAL
  Filled 2017-06-15: qty 1

## 2017-06-15 MED ORDER — APIXABAN 5 MG PO TABS
5.0000 mg | ORAL_TABLET | Freq: Two times a day (BID) | ORAL | Status: DC
  Administered 2017-06-15 – 2017-06-16 (×2): 5 mg via ORAL
  Filled 2017-06-15 (×2): qty 1

## 2017-06-15 MED ORDER — PRO-STAT SUGAR FREE PO LIQD
30.0000 mL | Freq: Two times a day (BID) | ORAL | Status: DC
  Administered 2017-06-15 – 2017-06-16 (×2): 30 mL via ORAL
  Filled 2017-06-15 (×2): qty 30

## 2017-06-15 MED ORDER — OXYCODONE-ACETAMINOPHEN 5-325 MG PO TABS
1.0000 | ORAL_TABLET | ORAL | Status: DC | PRN
  Administered 2017-06-15 – 2017-06-16 (×3): 2 via ORAL
  Filled 2017-06-15 (×3): qty 2

## 2017-06-15 MED ORDER — APIXABAN 5 MG PO TABS: 5.0000 mg | ORAL_TABLET | Freq: Two times a day (BID) | ORAL | Status: DC

## 2017-06-15 MED ORDER — PAROXETINE HCL 20 MG PO TABS
20.0000 mg | ORAL_TABLET | Freq: Every day | ORAL | Status: DC
  Administered 2017-06-15 – 2017-06-16 (×2): 20 mg via ORAL
  Filled 2017-06-15 (×2): qty 1

## 2017-06-15 MED ORDER — INSULIN GLARGINE 100 UNIT/ML ~~LOC~~ SOLN
40.0000 [IU] | Freq: Every day | SUBCUTANEOUS | Status: DC
  Administered 2017-06-16: 40 [IU] via SUBCUTANEOUS
  Filled 2017-06-15 (×3): qty 0.4

## 2017-06-15 MED ORDER — LOSARTAN POTASSIUM 25 MG PO TABS
25.0000 mg | ORAL_TABLET | Freq: Every day | ORAL | Status: DC
  Administered 2017-06-16: 25 mg via ORAL
  Filled 2017-06-15: qty 1

## 2017-06-15 MED ORDER — AMOXICILLIN-POT CLAVULANATE 875-125 MG PO TABS
1.0000 | ORAL_TABLET | Freq: Two times a day (BID) | ORAL | Status: DC
  Administered 2017-06-15 – 2017-06-16 (×2): 1 via ORAL
  Filled 2017-06-15 (×2): qty 1

## 2017-06-15 MED ORDER — AMLODIPINE BESYLATE 10 MG PO TABS
10.0000 mg | ORAL_TABLET | Freq: Every day | ORAL | Status: DC
  Administered 2017-06-16: 10 mg via ORAL
  Filled 2017-06-15: qty 1

## 2017-06-15 NOTE — NC FL2 (Signed)
[place] LEVEL OF CARE SCREENING TOOL     IDENTIFICATION  Patient Name: [name] Birthdate: 09-09-1961 Sex: female Admission Date (Current Location): 06/13/2017  [facility] and [place] Number:  Herbalist and Address:  The . [facility], [place] [address], [place], [place] [address]      Provider Number: [id number]  Attending Physician Name and Address:  Oval Linsey, MD  Relative Name and Phone Number:       Current Level of Care: Hospital Recommended Level of Care: [place] Prior Approval Number:    Date Approved/Denied:   PASRR Number: [id number]  Discharge Plan: SNF    Current Diagnoses: Patient Active Problem List   Diagnosis Date Noted  . Acute osteomyelitis of right foot ([place]) 06/13/2017  . Status post amputation of toe of right foot ([place]) 06/05/2017  . Dehiscence of external surgical wound 05/29/2017  . 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 H/O amputation of lesser toe, right ([place]) 05/19/2017  . PAD (peripheral artery disease) ([place])   . Sepsis due to other etiology ([place]) 04/06/2017  . CKD (chronic kidney disease), stage III 11/24/2016  . Suspected sleep apnea 11/24/2016  . Severe obesity (BMI >= 40) ([place]) 02/24/2016  . COPD GOLD 0  02/24/2016  . Chronic systolic CHF (congestive heart failure) ([place]) 02/12/2016  . Encounter for therapeutic drug monitoring 02/10/2016  . Symptomatic bradycardia 01/12/2016  . Essential hypertension 12/22/2015  . Wheeze   . 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 Tobacco abuse 10/23/2015  . Coronary artery disease involving native coronary artery of native heart   . DOE (dyspnea on exertion) 04/29/2015  . PAF (paroxysmal atrial fibrillation) ([place]) 01/16/2015  . Carotid arterial disease ([place]) 01/16/2015  . Claudication ([place]) 01/15/2015  . Demand ischemia ([place]) 10/29/2014  . Insomnia 02/03/2014   . S/P peripheral artery angioplasty - [place]bo[place] atherectomy; R SFA 09/11/2013    Class: Acute  . Leg pain, bilateral 08/19/2013  . Hypothyroidism 07/31/2013  . Cellulitis 06/13/2013  . History of cocaine abuse 06/13/2013  . Long term current use of anticoagulant therapy 05/20/2013  . Alcohol abuse   . Narcotic abuse   . Marijuana abuse   . Alcoholic cirrhosis ([place])   . DM (diabetes mellitus), type 2 with peripheral vascular complications ([name])     Orientation RESPIRATION BLADDER Height & Weight     Self, Situation, Time, Place  O2 (2L [place]) Incontinent, External catheter Weight: 258 lb 13.1 oz (117.4 kg) Height:  5\' 4"  (162.6 cm)  BEHAVIORAL SYMPTOMS/MOOD NEUROLOGICAL BOWEL NUTRITION STATUS      Continent Diet  AMBULATORY STATUS COMMUNICATION OF NEEDS Skin   Extensive Assist Verbally Wound Vac (will have prevena wound vac on right foot amputation site)                       Personal Care Assistance Level of Assistance  Bathing, Dressing Bathing Assistance: Maximum assistance   Dressing Assistance: Maximum assistance     Functional Limitations Info             SPECIAL CARE FACTORS FREQUENCY  PT (By licensed PT), OT (By licensed OT)     PT Frequency: 5/wk OT Frequency: 5/wk            Contractures      Additional Factors Info  Code Status, Allergies, Insulin Sliding Scale Code Status Info: DNR Allergies Info: Gabapentin,  Lyrica Pregabalin   Insulin Sliding Scale Info: 4/day       Current Medications (06/15/2017):  This is the current hospital active medication list Current Facility-Administered Medications  Medication Dose Route Frequency Provider Last Rate Last Dose  . 0.9 %  sodium chloride infusion   Intravenous Continuous Newt Minion, MD 10 mL/hr at 06/14/17 1639 10 mL/hr at 06/14/17 1639  . albuterol (PROVENTIL) (2.5 MG/3ML) 0.083% nebulizer solution 3 mL  3 mL Inhalation Q6H PRN Zada Finders, MD      . amiodarone (PACERONE) tablet 100 mg   100 mg Oral Daily Zada Finders, MD   100 mg at 06/15/17 1024  . amoxicillin-clavulanate (AUGMENTIN) 875-125 MG per tablet 1 tablet  1 tablet Oral Q12H Maryellen Pile, MD      . atorvastatin (LIPITOR) tablet 40 mg  40 mg Oral q1800 Zada Finders, MD   40 mg at 06/14/17 1743  . bisacodyl (DULCOLAX) suppository 10 mg  10 mg Rectal Daily PRN Newt Minion, MD      . Buprenorphine HCl FILM 1 Film  1 Film Buccal BID Zada Finders, MD      . clopidogrel (PLAVIX) tablet 75 mg  75 mg Oral Q breakfast Zada Finders, MD   75 mg at 06/15/17 0842  . docusate sodium (COLACE) capsule 100 mg  100 mg Oral BID Newt Minion, MD   100 mg at 06/15/17 1024  . enoxaparin (LOVENOX) injection 40 mg  40 mg Subcutaneous Q24H Zada Finders, MD   40 mg at 06/14/17 2101  . feeding supplement (PRO-STAT SUGAR FREE 64) liquid 30 mL  30 mL Oral BID Oval Linsey, MD      . folic acid (FOLVITE) tablet 1 mg  1 mg Oral Daily Zada Finders, MD   1 mg at 06/15/17 1023  . HYDROmorphone (DILAUDID) injection 1 mg  1 mg Intravenous Q2H PRN Newt Minion, MD   1 mg at 06/15/17 1024  . insulin aspart (novoLOG) injection 0-20 Units  0-20 Units Subcutaneous TID WC Zada Finders, MD   7 Units at 06/15/17 0840  . insulin glargine (LANTUS) injection 30 Units  30 Units Subcutaneous QHS Zada Finders, MD   30 Units at 06/14/17 2120  . lactated ringers infusion   Intravenous Continuous Roberts Gaudy, MD 10 mL/hr at 06/14/17 1141    . levothyroxine (SYNTHROID, LEVOTHROID) tablet 50 mcg  50 mcg Oral QAC breakfast Zada Finders, MD   50 mcg at 06/15/17 [phone]  . magnesium citrate solution 1 Bottle  1 Bottle Oral Once PRN Newt Minion, MD      . methocarbamol (ROBAXIN) tablet 500 mg  500 mg Oral Q6H PRN Newt Minion, MD   500 mg at 06/15/17 0152   Or  . methocarbamol (ROBAXIN) 500 mg in dextrose 5 % 50 mL IVPB  500 mg Intravenous Q6H PRN Newt Minion, MD      . metoCLOPramide (REGLAN) tablet 5-10 mg  5-10 mg Oral Q8H PRN Newt Minion, MD        Or  . metoCLOPramide (REGLAN) injection 5-10 mg  5-10 mg Intravenous Q8H PRN Newt Minion, MD      . mometasone-formoterol [facility]) 200-5 MCG/ACT inhaler 2 puff  2 puff Inhalation BID Zada Finders, MD   2 puff at 06/15/17 0734  . multivitamin with minerals tablet 1 tablet  1 tablet Oral Daily Oval Linsey, MD   1 tablet at 06/14/17 [phone]  . ondansetron (ZOFRAN) tablet 4  mg  4 mg Oral Q6H PRN Newt Minion, MD   4 mg at 06/14/17 1620   Or  . ondansetron [facility]) injection 4 mg  4 mg Intravenous Q6H PRN Newt Minion, MD      . oxyCODONE-acetaminophen (PERCOCET/ROXICET) 5-325 MG per tablet 1-2 tablet  1-2 tablet Oral Q4H PRN Maryellen Pile, MD      . polyethylene glycol (MIRALAX / GLYCOLAX) packet 17 g  17 g Oral Daily PRN Newt Minion, MD      . traZODone (DESYREL) tablet 100 mg  100 mg Oral QHS Katherine Roan, MD   100 mg at 06/14/17 2215  . vitamin B-12 (CYANOCOBALAMIN) tablet 1,000 mcg  1,000 mcg Oral Daily Zada Finders, MD   1,000 mcg at 06/14/17 0233     Discharge Medications: Please see discharge summary for a list of discharge medications.  Relevant Imaging Results:  Relevant Lab Results:   Additional Information SS#: [id number]  Jorge Ny, LCSW

## 2017-06-15 NOTE — Progress Notes (Signed)
Inpatient Diabetes Program Recommendations  AACE/ADA: New Consensus Statement on Inpatient Glycemic Control (2015)  Target Ranges:  Prepandial:   less than 140 mg/dL      Peak postprandial:   less than 180 mg/dL (1-2 hours)      Critically ill patients:  140 - 180 mg/dL   Results for IVELIZ, [name] (MRN [id number]) as of 06/15/2017 11:04  Ref. Range 06/14/2017 10:44 06/14/2017 11:49 06/14/2017 12:59 06/14/2017 13:39 06/14/2017 15:56 06/14/2017 21:14 06/15/2017 07:42  Glucose-Capillary Latest Ref Range: 65 - 99 mg/dL 301 (H) 271 (H) 208 (H) 180 (H) 176 (H) 373 (H) 205 (H)   Review of Glycemic Control  Diabetes history: DM2 Outpatient Diabetes medications: Lantus 48 units QHS, Novolog 25 units TID with meals Current orders for Inpatient glycemic control: Lantus 30 units daily, Novolog 0-20 units TID with meals  Inpatient Diabetes Program Recommendations: Insulin - Basal: Please consider increasing Lantus to 35 units daily. Correction (SSI): Please consider ordering Novolog 0-5 units QHS. Insulin - Meal Coverage: Please consider ordering Novolog 5 units TID with meals for meal coverage if patient eats at least 50% of meals.  Thanks, Barnie Alderman, RN, MSN, CDE Diabetes Coordinator Inpatient Diabetes Program [phone] (Team Pager from 8am to 5pm)

## 2017-06-15 NOTE — Evaluation (Addendum)
Physical Therapy Evaluation Patient Details Name: [name] MRN: [id number] DOB: [birth year] Today's Date: 06/15/2017   History of Present Illness  Patient is a 56 y/o female who presents with poorly controlled diabetes complicated by peripheral neuropathy, PVD, morbid obesity, chronic renal insufficiency, CAD, and polysubstance abuse who presents with generalized malaise associated with N/V and a poorly healing right second metatarsal amputation wound with purulent drainage. Admitted with sepsis secondary to osteomyelitis of 3rd toe on right foot. s/p right foot MT amputation 8/1. PMH includes   Clinical Impression  Patient presents with pain and post surgical deficits s/p above surgery. Pt noncompliant with NWB status RLE during transfers. Anxious about mobility. Requires assist of 2 for squat pivot transfer to [facility] and chair with MOd A of 2 but unable to maintain NWB despite max cues. Pt reports walking on right foot PTA when she was not supposed too. Education re: elevation, AROM and exercises. Would benefit from SNF to maximize independence and mobility prior to return home. Will follow acutely.    Follow Up Recommendations SNF;Supervision for mobility/OOB;Supervision/Assistance - 24 hour    Equipment Recommendations  Other (comment) (defer to next venue)    Recommendations for Other Services OT consult     Precautions / Restrictions Precautions Precautions: Fall Precaution Comments: wound vac Required Braces or Orthoses: Other Brace/Splint Other Brace/Splint: post op shoe RLE and left foot (has one from home) Restrictions Weight Bearing Restrictions: Yes RLE Weight Bearing: Non weight bearing      Mobility  Bed Mobility Overal bed mobility: Needs Assistance Bed Mobility: Supine to Sit     Supine to sit: Mod assist;HOB elevated     General bed mobility comments: Assist to elevate trunk to get to EOB.   Transfers Overall transfer level: Needs assistance Equipment  used: None Transfers: Set designer Transfers;Sit to/from Stand Sit to Stand: Mod assist;+2 safety/equipment   Squat pivot transfers: Mod assist;+2 physical assistance     General transfer comment: Stood from EOB x1, SPT bed to [facility] and BSC to chair with max cues for technique. Not compliant with WB RLE.  Ambulation/Gait             General Gait Details: Deferred as pt non compliant with WB during transfers.  Stairs            Wheelchair Mobility    Modified Rankin (Stroke Patients Only)       Balance Overall balance assessment: Needs assistance Sitting-balance support: Feet supported;No upper extremity supported Sitting balance-Leahy Scale: Fair     Standing balance support: During functional activity Standing balance-Leahy Scale: Poor Standing balance comment: Not able to stand wihtout placing weight through RLE.                             Pertinent Vitals/Pain Pain Assessment: Faces Faces Pain Scale: Hurts whole lot Pain Location: RLE at surgical site Pain Descriptors / Indicators: Operative site guarding;Aching;Sore Pain Intervention(s): Monitored during session;Repositioned;Premedicated before session;Patient requesting pain meds-RN notified;Limited activity within patient's tolerance    Home Living Family/patient expects to be discharged to:: Skilled nursing facility Living Arrangements: Other relatives (sister) Available Help at Discharge: Family;Available PRN/intermittently Type of Home: Mobile home Home Access: Stairs to enter Entrance Stairs-Rails: Left;Right Entrance Stairs-Number of Steps: 5-6 Home Layout: One level Home Equipment: Walker - 4 wheels;Shower seat;Wheelchair - [employer] - 2 wheels;Crutches      Prior Function  Comments: Reports walking on foot despite being NWB.      Hand Dominance   Dominant Hand: Left    Extremity/Trunk Assessment   Upper Extremity Assessment Upper Extremity Assessment:  Defer to OT evaluation    Lower Extremity Assessment Lower Extremity Assessment: RLE deficits/detail;LLE deficits/detail RLE Deficits / Details: Ankle AROM WFL, able to perform SLR without difficulty.  RLE Sensation: decreased light touch;history of peripheral neuropathy LLE Deficits / Details: Redness distal to knee and tender to touch. LLE Sensation: decreased light touch;history of peripheral neuropathy       Communication   Communication: No difficulties  Cognition Arousal/Alertness: Awake/alert Behavior During Therapy: Anxious Overall Cognitive Status: Within Functional Limits for tasks assessed                                 General Comments: Poor awareness of deficits and overall medical condition.      General Comments General comments (skin integrity, edema, etc.): VSS.     Exercises     Assessment/Plan    PT Assessment Patient needs continued PT services  PT Problem List Decreased strength;Decreased mobility;Decreased safety awareness;Decreased knowledge of precautions;Decreased activity tolerance;Pain;Decreased balance;Impaired sensation;Decreased cognition       PT Treatment Interventions Therapeutic activities;Therapeutic exercise;Patient/family education;Wheelchair mobility training;Balance training;Functional mobility training;Neuromuscular re-education;Gait training    PT Goals (Current goals can be found in the Care Plan section)  Acute Rehab PT Goals Patient Stated Goal: to improve this pain and get better PT Goal Formulation: With patient Time For Goal Achievement: 06/29/17 Potential to Achieve Goals: Good    Frequency Min 2X/week   Barriers to discharge Decreased caregiver support;Inaccessible home environment      Co-evaluation               AM-PAC PT "6 Clicks" Daily Activity  Outcome Measure Difficulty turning over in bed (including adjusting bedclothes, sheets and blankets)?: None Difficulty moving from lying on back  to sitting on the side of the bed? : Total Difficulty sitting down on and standing up from a chair with arms (e.g., wheelchair, bedside commode, etc,.)?: Total Help needed moving to and from a bed to chair (including a wheelchair)?: A Lot Help needed walking in hospital room?: Total Help needed climbing 3-5 steps with a railing? : Total 6 Click Score: 10    End of Session Equipment Utilized During Treatment: Gait belt;Other (comment) (post op shoe) Activity Tolerance: Patient limited by pain Patient left: in chair;with call bell/phone within reach;with chair alarm set Nurse Communication: Mobility status PT Visit Diagnosis: Pain;Muscle weakness (generalized) (M62.81);Unsteadiness on feet (R26.81);Difficulty in walking, not elsewhere classified (R26.2) Pain - Right/Left: Right Pain - part of body: Ankle and joints of foot    Time: 9390-3009 PT Time Calculation (min) (ACUTE ONLY): 24 min   Charges:   PT Evaluation $PT Eval Moderate Complexity: 1 Mod PT Treatments $Therapeutic Activity: 8-22 mins   PT G Codes:        Wray Kearns, PT, DPT [phone]    Marguarite Arbour A Itay Mella 06/15/2017, 10:53 AM

## 2017-06-15 NOTE — Progress Notes (Signed)
Internal Medicine Attending  Date: 06/15/2017  Patient name: [name] Medical record number: [id number] Date of birth: Jan 22, 1961 Age: 56 y.o. Gender: female  I saw and evaluated the patient. I reviewed the resident's note by Dr. Charlynn Grimes and I agree with the resident's findings and plans as documented in his progress note.  When seen on rounds this morning [name] noted some pain at the transmetatarsal amputation site. A wound VAC was in place and the foot looked otherwise well. We are converting her to a 6 week course of oral Augmentin and she will likely be transferred to a skilled nursing facility tomorrow for continued physical therapy. In the meantime, we continue to make adjustments to her pain regimen.

## 2017-06-15 NOTE — Clinical Social Work Note (Signed)
Clinical Social Work Assessment  Patient Details  Name: [name] MRN: [id number] Date of Birth: 1961/09/07  Date of referral:  06/15/17               Reason for consult:  Facility Placement                Permission sought to share information with:  Facility Sport and exercise psychologist, Family Supports Permission granted to share information::  Yes, Verbal Permission Granted  Name::     Automotive engineer::  SNF  Relationship::  sister  Contact Information:     Housing/Transportation Living arrangements for the past 2 months:  [place] of Information:  Patient Patient Interpreter Needed:  None Criminal Activity/Legal Involvement Pertinent to Current Situation/Hospitalization:  No - Comment as needed Significant Relationships:  Siblings Lives with:  Siblings Do you feel safe going back to the place where you live?  No Need for family participation in patient care:  No (Coment)  Care giving concerns:  Pt lives at home with sister who is not capable of providing physical assist.  Patient with new amputation and does not feel as if she can put weight on the foot at this time.   Social Worker assessment / plan:  CSW spoke with pt concerning PT and MD recommendation for SNF.  Patient had already been spoken to some by PT and MD about this recommendation and understands her need to go.  CSW explained SNF and SNF referral process.  Employment status:  Disabled (Comment on whether or not currently receiving Disability) Insurance information:  Medicare PT Recommendations:  [facility] / Referral to community resources:  [place]  Patient/Family's Response to care:  Agreeable to SNF placement and realistic about her new impairment and need for medical supervision.  Patient/Family's Understanding of and Emotional Response to Diagnosis, Current Treatment, and Prognosis:  Pt has good outlook on her condition and is optimistic about  getting back to baseline mobility.  Emotional Assessment Appearance:  Appears stated age Attitude/Demeanor/Rapport:    Affect (typically observed):  Appropriate, Accepting Orientation:  Oriented to Self, Oriented to Place, Oriented to  Time, Oriented to Situation Alcohol / Substance use:  Not Applicable Psych involvement (Current and /or in the community):  No (Comment)  Discharge Needs  Concerns to be addressed:  Care Coordination Readmission within the last 30 days:  No Current discharge risk:  Physical Impairment Barriers to Discharge:  Continued Medical Work up   Jorge Ny, LCSW 06/15/2017, 1:36 PM

## 2017-06-15 NOTE — Progress Notes (Addendum)
   Subjective:  Patient doing well this morning. Still having some pain in right foot post surgery. Reports the oral pain medications are not helping much but the IV is very beneficial. Still complaining of left foot/leg numbness and left buttock pain. Reports working with PT and having a difficult time. Denies any SOB, chest pain.   Objective:  Vital signs in last 24 hours: Vitals:   06/15/17 0320 06/15/17 0329 06/15/17 0734 06/15/17 0739  BP:  117/64  (!) 117/49  Pulse:  89  84  Resp:  13  12  Temp:  98.8 F (37.1 C)  98.3 F (36.8 C)  TempSrc:  Oral  Oral  SpO2:  94% 97% 96%  Weight: 258 lb 13.1 oz (117.4 kg)     Height:       Physical Exam  Constitutional: She is oriented to person, place, and time and well-developed, well-nourished, and in no distress. No distress.  Cardiovascular: Normal rate and regular rhythm.   Pulmonary/Chest: Effort normal and breath sounds normal.  Abdominal: Soft. Bowel sounds are normal. She exhibits no distension.  Musculoskeletal:  Wound vac in place on right foot. Bruising over left buttock. Ulcers present over left ankle and foot.   Neurological: She is alert and oriented to person, place, and time.  Skin: Skin is warm and dry.  Vitals reviewed.   Assessment/Plan:  Status post transmetatarsal amputation on the right for acute third metatarsal head osteomyelitis:  POD#1. De-escalating antibiotic therapy today. Stop Vanc and Zosyn. She had previously been on course of Doxycycline prior to admission from her 6/15 2nd toe osteomyelitis. Given her recurrence while on Doxy, low clinical suspicion for MRSA infection. MRSA nasal swap negative. Unfortuantley we have no culture data to guide ABX therapy. Blood culture with NGTD. Given her presentation with sepsis and failure of her outpatient doxy, will transition to PO Augmentin today for 6 week course. Stop date 08/06/2017. Pain not well controlled on the norco, will change to percocet. Continue dilaudid  prn for breakthrough pain. Worked with PT today, will need SNF placement. Patient is stable and OK for discharge from internal medicine standpoint if OK with Ortho. Will transfer to floor pending SNF placement.   PAD: Patient with history of PAD. Most recent ABI on 05/19/17 shows ABI of 0.65 on the right and 0.31 on the left. She does follow with Dr. Gwenlyn Found outpatient. She is s/p directional atherectomy and drug-eluting balloon angioplasty. Will need follow up outpatient after discharge. Will repeat B12 level (currently on supplementation) to ensure adequate replacement as possible cause of her peripheral neuropathy.   Fecal occult test positive:  She reports having colonoscopy last year. Reports having polyps removed and being told she will need a repeat in 1 year. Says it was done at [facility]. Unable to find any records. Will need follow up outpatient for repeat colonoscopy.  She will be referred to GI as an outpatient to undergo a colonoscopy given her positive FOBT.  Diabetes Mellitus CBGs not well controlled. Increase Lantus 30 > 40 units qhs. Continue SSI-R.   HTN: BP stable. Holding home meds. AKI resolved. Will resume tomorrow.   Chronic Systolic CHF EF 67-61% Sepsis improved. Will resume home torsemide tomorrow.   Paroxsymal Afib  Resume Eliquis today, D/C Lovenox today. Continue amiodarone   Dispo: Anticipated discharge in approximately 1 day(s).   Karla Pile, MD 06/15/2017, 11:11 AM Pager: [phone]

## 2017-06-15 NOTE — Progress Notes (Signed)
Report called to [facility], RN on 5N 28.  Patient remained sitting n chair most of the day and complained of foot - right pain.  Patient given po med ..did not tolerate well. Patient had to have 2 IV pain meds.

## 2017-06-15 NOTE — Progress Notes (Signed)
Subjective: 1 Day Post-Op Procedure(s) (LRB): Right foot transmetatarsal amputation (Right)  The patient is sitting up in bed discussing the plan of care with her nurse upon exam.  No concerns voiced. Her pain being managed with IV Dilaudid 1 mg when necessary as well as orals. Nurse to plan to transition to orals this morning.  Objective:   Body mass index is 44.43 kg/m.  VITALS:  Temp:  [97.2 F (36.2 C)-98.8 F (37.1 C)] 98.3 F (36.8 C) (08/02 0739) Pulse Rate:  [69-98] 84 (08/02 0739) Resp:  [11-19] 12 (08/02 0739) BP: (105-147)/(49-96) 117/49 (08/02 0739) SpO2:  [90 %-100 %] 96 % (08/02 0739) Weight:  [258 lb 13.1 oz (117.4 kg)] 258 lb 13.1 oz (117.4 kg) (08/02 0320)   Physical Exam  Constitutional: She is oriented to person, place, and time and well-developed, well-nourished, and in no distress.  Pulmonary/Chest: Effort normal.  Neurological: She is alert and oriented to person, place, and time.  Wound VAC in place with good seal to fit  Psychiatric: Affect normal.  Nursing note reviewed.    LABS  Recent Labs  06/13/17 0910 06/14/17 0231 06/15/17 0701  HGB 10.4* 9.2* 9.1*  WBC 23.2* 15.8* 11.4*  PLT 315 225 200    Recent Labs  06/14/17 0231 06/15/17 0701  NA 134* 136  K 4.3 4.5  CL 102 103  CO2 26 26  BUN 35* 19  CREATININE 1.35* 1.18*  GLUCOSE 287* 206*   No results for input(s): LABPT, INR in the last 72 hours.   Assessment/Plan: Nursing to change wound VAC over to prevena at discharge. We'll follow-up in office in 1 week plan to remove wound VAC at that time.  Plan for discharge tomorrow Discharge to SNF  Author: Dondra Prader, [place] 06/15/2017 11:31 AM  [phone]

## 2017-06-16 LAB — GLUCOSE, CAPILLARY
GLUCOSE-CAPILLARY: 197 mg/dL — AB (ref 65–99)
Glucose-Capillary: 296 mg/dL — ABNORMAL HIGH (ref 65–99)

## 2017-06-16 MED ORDER — DOCUSATE SODIUM 100 MG PO CAPS: 100.0000 mg | ORAL_CAPSULE | Freq: Two times a day (BID) | ORAL | 0 refills | Status: DC

## 2017-06-16 MED ORDER — PRO-STAT SUGAR FREE PO LIQD: 30.0000 mL | Freq: Two times a day (BID) | ORAL | 0 refills | Status: DC

## 2017-06-16 MED ORDER — AMOXICILLIN-POT CLAVULANATE 875-125 MG PO TABS
1.0000 | ORAL_TABLET | Freq: Two times a day (BID) | ORAL | 0 refills | Status: DC
Start: 2017-06-16 — End: 2017-07-05

## 2017-06-16 MED ORDER — OXYCODONE-ACETAMINOPHEN 5-325 MG PO TABS: 1.0000 | ORAL_TABLET | Freq: Four times a day (QID) | ORAL | 0 refills | Status: DC | PRN

## 2017-06-16 MED ORDER — PAROXETINE HCL 20 MG PO TABS: 20.0000 mg | ORAL_TABLET | Freq: Every day | ORAL | 0 refills | Status: DC

## 2017-06-16 NOTE — Discharge Summary (Signed)
Name: [name] MRN: [id number] DOB: 1961/07/07 56 y.o. PCP: Sandi Mariscal, MD  Date of Admission: 06/13/2017  8:25 AM Date of Discharge: 06/16/2017 Attending Physician: Oval Linsey, MD  Discharge Diagnosis: Principal Problem:   Acute osteomyelitis of right foot ([place]) Active Problems:   DM (diabetes mellitus), type 2 with peripheral vascular complications ([place])   Hypothyroidism   PAF (paroxysmal atrial fibrillation) ([place])   Essential hypertension   Chronic systolic CHF (congestive heart failure) ([name])   COPD GOLD 0    CKD (chronic kidney disease), stage III   Sepsis due to other etiology [facility])   Status post amputation of toe of right foot [facility])   Discharge Medications: Allergies as of 06/16/2017      Reactions   Gabapentin Nausea And Vomiting   POSSIBLE SHAKING? TAKING MED CURRENTLY   Lyrica [pregabalin] Other (See Comments)   shaking      Medication List    STOP taking these medications   BELBUCA 75 MCG Film Generic drug:  Buprenorphine HCl     TAKE these medications   albuterol 108 (90 Base) MCG/ACT inhaler Commonly known as:  PROVENTIL HFA;VENTOLIN HFA Inhale 2 puffs into the lungs every 6 (six) hours as needed for wheezing or shortness of breath.   amiodarone 100 MG tablet Commonly known as:  PACERONE Take 1 tablet (100 mg total) by mouth daily.   amLODipine 10 MG tablet Commonly known as:  NORVASC TAKE ONE TABLET BY MOUTH ONCE DAILY   amoxicillin-clavulanate 875-125 MG tablet Commonly known as:  AUGMENTIN Take 1 tablet by mouth every 12 (twelve) hours.   apixaban 5 MG Tabs tablet Commonly known as:  ELIQUIS Take 1 tablet (5 mg total) by mouth 2 (two) times daily.   atorvastatin 40 MG tablet Commonly known as:  LIPITOR TAKE 1 TABLET BY MOUTH ONCE EVERY EVENING   bisoprolol 5 MG tablet Commonly known as:  ZEBETA TAKE ONE-HALF ( 2.5MG  TOTAL) TABLET BY MOUTH DAILY What changed:  See the new instructions.   cholecalciferol 1000 units  tablet Commonly known as:  VITAMIN D Take 1,000 Units by mouth daily.   clopidogrel 75 MG tablet Commonly known as:  PLAVIX Take 1 tablet (75 mg total) by mouth daily with breakfast.   cyanocobalamin 1000 MCG tablet Take 1 tablet (1,000 mcg total) by mouth daily.   docusate sodium 100 MG capsule Commonly known as:  COLACE Take 1 capsule (100 mg total) by mouth 2 (two) times daily.   feeding supplement (PRO-STAT SUGAR FREE 64) Liqd Take 30 mLs by mouth 2 (two) times daily.   folic acid 1 MG tablet Commonly known as:  FOLVITE TAKE 1 TABLET BY MOUTH EVERY DAY   insulin aspart 100 UNIT/ML FlexPen Commonly known as:  NOVOLOG FLEXPEN Inject 25 Units into the skin 3 (three) times daily with meals.   Insulin Glargine 100 UNIT/ML Solostar Pen Commonly known as:  LANTUS SOLOSTAR Inject 48 Units into the skin daily at 10 pm.   levothyroxine 50 MCG tablet Commonly known as:  SYNTHROID, LEVOTHROID Take 1 tablet (50 mcg total) by mouth daily.   losartan 25 MG tablet Commonly known as:  COZAAR TAKE ONE TABLET BY MOUTH ONCE DAILY   metolazone 2.5 MG tablet Commonly known as:  ZAROXOLYN TAKE 1 TABLE EVERY WEEK ON THURSDAY. MAY TAKE ONE ADDITIONAL TABLET AS DIRECTED BY HEART FAILURE CLINIC   multivitamin tablet Take 1 tablet by mouth daily.   oxyCODONE-acetaminophen 5-325 MG tablet Commonly known as:  ROXICET  Take 1 tablet by mouth every 8 (eight) hours as needed for severe pain. What changed:  Another medication with the same name was added. Make sure you understand how and when to take each.   oxyCODONE-acetaminophen 5-325 MG tablet Commonly known as:  PERCOCET/ROXICET Take 1-2 tablets by mouth every 6 (six) hours as needed for moderate pain or severe pain. What changed:  You were already taking a medication with the same name, and this prescription was added. Make sure you understand how and when to take each.   PARoxetine 20 MG tablet Commonly known as:  PAXIL Take 1 tablet  (20 mg total) by mouth daily.   potassium chloride SA 20 MEQ tablet Commonly known as:  K-DUR,KLOR-CON Take 1 tablet (20 mEq total) by mouth daily. Take an additional 20 meq (1 tab) on Thursday with metolazone   pregabalin 100 MG capsule Commonly known as:  LYRICA Take 100 mg by mouth 3 (three) times daily.   spironolactone 25 MG tablet Commonly known as:  ALDACTONE Take 1 tablet (25 mg total) by mouth daily.   SYMBICORT 160-4.5 MCG/ACT inhaler Generic drug:  budesonide-formoterol Inhale 2 puffs into the lungs 2 (two) times daily.   torsemide 20 MG tablet Commonly known as:  DEMADEX Take 2 tablets (40 mg total) by mouth 2 (two) times daily.   traZODone 100 MG tablet Commonly known as:  DESYREL Take 100 mg by mouth at bedtime.       Disposition and follow-up:   [name] was discharged from [facility] in Stable condition.  At the hospital follow up visit please address:  1.   Transmetatarsal amputation secondary to osteomyelitis of the right foot: Please assess for any recurrent signs of infection as well as pain control.  Depression: Started Paxil, titrate up as needed.   2.  Labs / imaging needed at time of follow-up: Repeat CBC/BMP  3.  Pending labs/ test needing follow-up: None  Follow-up Appointments:  Contact information for follow-up providers    Suzan Slick, NP Follow up in 1 week(s).   Specialty:  Orthopedic Surgery Contact information: [address] [place] [address] [phone]        Sandi Mariscal, MD Follow up in 2 week(s).   Specialty:  Internal Medicine Contact information: [place] [address] [phone]            Contact information for after-discharge care    Destination    [facility] Follow up.   Specialty:  [place] information: [address] [place] [facility] Course by problem list:  Status post transmetatarsal amputation of the right foot  due to  Sepsis Secondary to Osteomyelitis of Right Foot  Patient presented to [facility] on 7/31 with tachycardia, elevated WBC, and hypotension, and a 2 week history of purulent drainage from her 2nd right metatarsal amputation stump. X ray and follow-up MRI revealed osteomyelitis of her right 3rd metatarsal. She was initially treated with IV vancomycin and Zosyn. On 8/1 orthopedic surgery performed a transmetatarsal amputation of her right foot. Due to negative blood cultures and clinical improvement in the patients vital signs and WBC  her antibiotics were deescalated to oral Augmentin, end date 8/7.   Positive FOBT Patient's fecal occult blood test was positive. Reports colonoscopy at [facility] a year  ago with polyps removed. Reports being told to follow up in 1 year for repeat colonoscopy. Will need follow up outpatient.   Chronic Opioid Dependence:  Patient on buprenorphine 75 mcg film bid at home. Unable to prescribe inpatient or at discharge. Will hold at discharge and patient will need follow up outpatient with her provider for further prescriptions.   Depression:  PHQ9 was 19. Started Paxil 20 mg daily. Titrate as needed.   PAD: Patient with history of PAD. Most recent ABI on 05/19/17 shows ABI of 0.65 on the right and 0.31 on the left. She does follow with Dr. Gwenlyn Found outpatient. She is s/p directional atherectomy and drug-eluting balloon angioplasty. Will need follow up outpatient after discharge. B12 adequately supplemented.   Diabetes Mellitus Resume home medications.   HTN: Resume home medications   Chronic Systolic CHF EF 29-51% Resume home medications   Paroxsymal Afib  Resume home medications    Discharge Vitals:   BP (!) 141/61 (BP Location: Left Arm)   Pulse 88   Temp 98.4 F (36.9 C) (Oral)   Resp 18   Ht 5\' 4"  (1.626 m)   Wt 258 lb 13.1 oz (117.4 kg)    LMP 11/27/2012   SpO2 98%   BMI 44.43 kg/m   Pertinent Labs, Studies, and Procedures:  XR 1. Fracture of the stump of the second metatarsal. 2. New subluxation at the third MTP joint. 3. Slight irregularity of the cortex of the base of the proximal phalanx of the third toe which could represent osteomyelitis.  MRI Right Foot 1. Limited examination by patient motion and lack of intravenous contrast. 2. Findings suspicious for osteomyelitis involving the third metatarsal head and neck and possibly involving the proximal aspect of the third proximal phalanx. 3. Edema in the distal aspect of the second metatarsal stump. This is compatible with edema associated with the fracture at that location. 4. Diffuse soft tissue edema.  Discharge Instructions: Discharge Instructions    Call MD for:  difficulty breathing, headache or visual disturbances    Complete by:  As directed    Call MD for:  persistant dizziness or light-headedness    Complete by:  As directed    Call MD for:  persistant nausea and vomiting    Complete by:  As directed    Call MD for:  redness, tenderness, or signs of infection (pain, swelling, redness, odor or green/yellow discharge around incision site)    Complete by:  As directed    Call MD for:  temperature >100.4    Complete by:  As directed    Diet - low sodium heart healthy    Complete by:  As directed    Discharge instructions    Complete by:  As directed    Please take prescribed Augmentin 1 tablet twice a day for 4 days to prevent the return of your infection. Please follow-up with your primary care doctor in two weeks and your orthopedic surgeon   Increase activity slowly    Complete by:  As directed       Signed: Maryellen Pile, MD 06/16/2017, 1:43 PM

## 2017-06-16 NOTE — Progress Notes (Signed)
  Subjective:Patient was seen and examined today. She reported that she slept better last night due to better pain relief and restarting her trazodone. She reports increased pruritis in her left leg. She denied any chest pain, shortness of breath, new onset weakness, or dysuria.  Objective: Vital signs in last 24 hours: Vitals:   06/15/17 1553 06/15/17 2020 06/16/17 0441 06/16/17 0758  BP: 123/71 (!) 137/50 (!) 141/61   Pulse: 94 91 88   Resp:  18 18   Temp: 98.7 F (37.1 C) 98.5 F (36.9 C) 98.4 F (36.9 C)   TempSrc: Oral Oral Oral   SpO2: 99% 99% 93% 98%  Weight:      Height:       Weight change:   Intake/Output Summary (Last 24 hours) at 06/16/17 1619 Last data filed at 06/16/17 0900  Gross per 24 hour  Intake              240 ml  Output              750 ml  Net             -510 ml   BP (!) 141/61 (BP Location: Left Arm)   Pulse 88   Temp 98.4 F (36.9 C) (Oral)   Resp 18   Ht 5\' 4"  (1.626 m)   Wt 117.4 kg (258 lb 13.1 oz)   LMP 11/27/2012   SpO2 98%   BMI 44.43 kg/m  General appearance: alert, cooperative and no distress Head: Normocephalic, without obvious abnormality, atraumatic Lungs: clear to auscultation bilaterally Heart: regular rate and rhythm, S1, S2 normal, no murmur, click, rub or gallop Abdomen: soft, non-tender; bowel sounds normal; no masses,  no organomegaly Extremities: Transmetarsal amputation of left foot. Wound Vac in place.  Skin: Eryethema noted on left leg  Assessment/Plan:  Status Post Transmetarsal Amputation of the Right Foot secondary to Osteomyelitis  Patient is POD 3. Patient switched to oral Augmentin with an estimated stop date of 06/20/2017. Pain management with Percocet.    Diabetes Mellitus  Continue 40u Lantus and sliding scale insulin as needed.   HTN Resume Home blood pressure medications today   Heart Failure With Reduced Ejection Fraction (EF 40-45%)  Resuming home torsemide   Paroxsymal Afib  Continue Eliquis and  amiodarone today   Disposition: Discharge today to SNF  This is a Careers information officer Note.  The care of the patient was discussed with Dr.Tola Meas and the assessment and plan formulated with their assistance.  Please see their attached note for official documentation of the daily encounter.   LOS: 3 days   Avva, Juanda Chance, Medical Student 06/16/2017, 4:19 PM   Attestation for Student Documentation:  I personally was present and performed or re-performed the history, physical exam and medical decision-making activities of this service and have verified that the service and findings are accurately documented in the student's note.  Maryellen Pile, MD 06/16/2017, 5:56 PM

## 2017-06-16 NOTE — Care Management Important Message (Signed)
Important Message  Patient Details  Name: [name] MRN: [id number] Date of Birth: [birth year]   Medicare Important Message Given:  Yes    Magdelyn Roebuck 06/16/2017, 1:13 PM

## 2017-06-16 NOTE — Progress Notes (Signed)
OT Cancellation Note  Patient Details Name: [name] MRN: [id number] DOB: 1961-02-19   Cancelled Treatment:    Reason Eval/Treat Not Completed: Other (comment) Pt is Medicare and current D/C plan is SNF. No apparent immediate acute care OT needs, therefore will defer OT to SNF. If OT eval is needed please call Acute Rehab Dept. at [phone] or text page OT at [phone].    [place], OT/L  [phone] 06/16/2017 06/16/2017, 3:06 PM

## 2017-06-16 NOTE — Social Work (Signed)
Clinical Social Worker facilitated patient discharge including contacting patient family and facility to confirm patient discharge plans.  Clinical information faxed to facility and family agreeable with plan.    CSW arranged ambulance transport via [facility] to [facility] .    RN to call [phone] to give report prior to discharge. Pt going to Room 307.  Clinical Social Worker will sign off for now as social work intervention is no longer needed. Please consult [place] again if new need arises.  Elissa Hefty, LCSW Clinical Social Worker [phone]

## 2017-06-16 NOTE — Progress Notes (Signed)
Patient is being discharged to SNF.[name] Discharge packages was given to transporter

## 2017-06-16 NOTE — Progress Notes (Signed)
Internal Medicine Attending  Date: 06/16/2017  Patient name: [name] Medical record number: [id number] Date of birth: [birth year] Age: 56 y.o. Gender: female  I saw and evaluated the patient. I reviewed the sub-intern/resident's note by Ms. Avva/Dr. Charlynn Grimes and I agree with the sub-intern/resident's findings and plans as documented in their progress note.  When seen on rounds this morning Ms. Jhaveri noted improvement in her pain control and slept much better. Her right foot metatarsal amputation wound looked clean. She is being discharged to a skilled nursing facility for further rehabilitation prior to returning home. Follow-up will be with her primary care provider.

## 2017-06-16 NOTE — Clinical Social Work Placement (Signed)
   CLINICAL SOCIAL WORK PLACEMENT  NOTE  Date:  06/16/2017  Patient Details  Name: [name] MRN: [id number] Date of Birth: 02/05/1961  Clinical Social Work is seeking post-discharge placement for this patient at the [place] level of care (*CSW will initial, date and re-position this form in  chart as items are completed):  Yes   Patient/family provided with [place] Work Department's list of facilities offering this level of care within the geographic area requested by the patient (or if unable, by the patient's family).  Yes   Patient/family informed of their freedom to choose among providers that offer the needed level of care, that participate in Medicare, Medicaid or managed care program needed by the patient, have an available bed and are willing to accept the patient.  Yes   Patient/family informed of [facility]'s ownership interest in [facility] and [facility], as well as of the fact that they are under no obligation to receive care at these facilities.  PASRR submitted to EDS on       PASRR number received on 06/15/17     Existing PASRR number confirmed on       FL2 transmitted to all facilities in geographic area requested by pt/family on 06/15/17     FL2 transmitted to all facilities within larger geographic area on       Patient informed that his/her managed care company has contracts with or will negotiate with certain facilities, including the following:        Yes   Patient/family informed of bed offers received.  Patient chooses bed at [facility] recommends and patient chooses bed at      Patient to be transferred to [facility] on 06/16/17.  Patient to be transferred to facility by [facility]     Patient family notified on 06/16/17 of transfer.  Name of family member notified:  patient responsible for self     PHYSICIAN Please prepare priority discharge summary,  including medications, Please sign FL2, Please sign DNR, Please prepare prescriptions     Additional Comment:    _______________________________________________ Normajean Baxter, LCSW 06/16/2017, 2:24 PM

## 2017-06-16 NOTE — Social Work (Signed)
CSW met with patient this day to discuss bed offers. Pt has accepted bed offer from [facility].  CSW will f/u for DC to SNF.  Elissa Hefty, LCSW Clinical Social Worker [phone]

## 2017-06-16 NOTE — Discharge Instructions (Addendum)
Antibiotic Medicine, Adult Antibiotic medicines treat infections caused by a type of germ called bacteria. They work by killing the bacteria that make you sick. When do I need to take antibiotics? You often need these medicines to treat bacterial infections, such as:  A urinary tract infection (UTI).  Strep throat.  Meningitis. This affects the spinal cord and brain.  A bad lung infection.  You may start the medicines while your doctor waits for tests to come back. When the tests come back, your doctor may change or stop your medicine. When are antibiotics not needed? You do not need these medicines for most common illnesses, such as:  A cold.  The flu.  A sore throat.  Antibiotics are not always needed for all infections caused by bacteria. Do not ask for these medicines, or take them, when they are not needed. What are the risks of taking antibiotics? Most antibiotics can cause an infection called Clostridium difficile.This causes watery poop (diarrhea). Let your doctor know right away if:  You have watery poop while taking an antibiotic.  You have watery poop after you stop taking an antibiotic. The illness can happen weeks after you stop the medicine.  You also have a risk of getting an infection in the future that antibiotics cannot treat (antibiotic-resistant infection). This type of infection can be dangerous. What else should I know about taking antibiotics?  You need to take the entire prescription. ? Take the medicine for as long as told by your doctor. ? Do not stop taking it even if you start to feel better.  Try not to miss any doses. If you miss a dose, call your doctor.  Birth control pills may not work. If you take birth control pills: ? Keep on taking them. ? Use a second form of birth control, such as a condom. Do this for as long as told by your doctor.  Ask your doctor: ? How long to wait in between doses. ? If you should take the medicine with  food. ? If there is anything you should stay away from while taking the antibiotic, such as: ? Food. ? Drinks. ? Medicines. ? If there are any side effects you should watch for.  Only take the medicines that your doctor told you to take. Do not take medicines that were given to someone else.  Drink a large glass of water with the medicine.  Ask the pharmacist for a tool to measure the medicine, such as: ? A syringe. ? A cup. ? A spoon.  Throw away any extra medicine. Contact a doctor if:  You get worse.  You have new joint pain or muscle aches after starting the medicine.  You have side effects from the medicine, such as: ? Stomach pain. ? Watery poop. ? Feeling sick to your stomach (nausea). Get help right away if:  You have signs of a very bad allergic reaction. If this happens, stop taking the medicine right away. Signs may include: ? Hives. These are raised, itchy, red bumps on the skin. ? Skin rash. ? Trouble breathing. ? Wheezing. ? Swelling. ? Feeling dizzy. ? Throwing up (vomiting).  Your pee (urine) is dark, or is the color of blood.  Your skin turns yellow.  You bruise easily.  You bleed easily.  You have very bad watery poop and cramps in your belly.  You have a very bad headache. Summary  Antibiotics are often used to treat infections caused by bacteria.  Only take  these medicines when needed.  Let your doctor know if you have watery poop while taking an antibiotic.  You need to take the entire prescription. This information is not intended to replace advice given to you by your health care provider. Make sure you discuss any questions you have with your health care provider. Document Released: 08/09/2008 Document Revised: 11/02/2016 Document Reviewed: 11/02/2016 Elsevier Interactive Patient Education  2017 [place] on my medicine - ELIQUIS (apixaban)  This medication education was reviewed with me or my healthcare  representative as part of my discharge preparation.  Why was Eliquis prescribed for you? Eliquis was prescribed for you to reduce the risk of forming blood clots that can cause a stroke if you have a medical condition called atrial fibrillation (a type of irregular heartbeat) OR to reduce the risk of a blood clots forming after orthopedic surgery.  What do You need to know about Eliquis ? Take your Eliquis TWICE DAILY - one tablet in the morning and one tablet in the evening with or without food.  It would be best to take the doses about the same time each day.  If you have difficulty swallowing the tablet whole please discuss with your pharmacist how to take the medication safely.  Take Eliquis exactly as prescribed by your doctor and DO NOT stop taking Eliquis without talking to the doctor who prescribed the medication.  Stopping may increase your risk of developing a new clot or stroke.  Refill your prescription before you run out.  After discharge, you should have regular check-up appointments with your healthcare provider that is prescribing your Eliquis.  In the future your dose may need to be changed if your kidney function or weight changes by a significant amount or as you get older.  What do you do if you miss a dose? If you miss a dose, take it as soon as you remember on the same day and resume taking twice daily.  Do not take more than one dose of ELIQUIS at the same time.  Important Safety Information A possible side effect of Eliquis is bleeding. You should call your healthcare provider right away if you experience any of the following: ? Bleeding from an injury or your nose that does not stop. ? Unusual colored urine (red or dark brown) or unusual colored stools (red or black). ? Unusual bruising for unknown reasons. ? A serious fall or if you hit your head (even if there is no bleeding).  Some medicines may interact with Eliquis and might increase your risk of  bleeding or clotting while on Eliquis. To help avoid this, consult your healthcare provider or pharmacist prior to using any new prescription or non-prescription medications, including herbals, vitamins, non-steroidal anti-inflammatory drugs (NSAIDs) and supplements.  This website has more information on Eliquis (apixaban): [email or web address].no.

## 2017-06-18 LAB — CULTURE, BLOOD (ROUTINE X 2)
CULTURE: NO GROWTH
Culture: NO GROWTH
SPECIAL REQUESTS: ADEQUATE
SPECIAL REQUESTS: ADEQUATE

## 2017-06-19 ENCOUNTER — Encounter (INDEPENDENT_AMBULATORY_CARE_PROVIDER_SITE_OTHER): Payer: Self-pay | Admitting: Orthopedic Surgery

## 2017-06-19 ENCOUNTER — Ambulatory Visit (INDEPENDENT_AMBULATORY_CARE_PROVIDER_SITE_OTHER): Payer: Medicare Other | Admitting: Orthopedic Surgery

## 2017-06-19 VITALS — Ht 64.0 in | Wt 258.0 lb

## 2017-06-19 DIAGNOSIS — Z89431 Acquired absence of right foot: Secondary | ICD-10-CM

## 2017-06-19 LAB — CBC AND DIFFERENTIAL
HEMATOCRIT: 33 — AB (ref 36–46)
HEMOGLOBIN: 10.5 — AB (ref 12.0–16.0)
Neutrophils Absolute: 9
Platelets: 251 (ref 150–399)
WBC: 12.9

## 2017-06-19 LAB — BASIC METABOLIC PANEL
BUN: 15 (ref 4–21)
Creatinine: 1.1 (ref 0.5–1.1)
Glucose: 130
POTASSIUM: 4.1 (ref 3.4–5.3)
SODIUM: 140 (ref 137–147)

## 2017-06-19 NOTE — Progress Notes (Signed)
Post-Op Visit Note   Patient: [name]           Date of Birth: 01/21/1961           MRN: [id number] Visit Date: 06/19/2017 PCP: Sandi Mariscal, MD  Chief Complaint:  Chief Complaint  Patient presents with  . Right Foot - Routine Post Op    06/14/17 right foot transmet amputation with vac    HPI:  Patient is a 56 year old woman who is seen today one week status post right transmetatarsal amputation. Wound VAC removed today. She is residing at [facility] for her rehabilitation. However she does state that it is very hard to keep her weight off of her foot and has been walking back and forth from the bathroom.    Ortho Exam Incision well approximated with sutures there is maceration surrounding the incision slight bleeding there is moderate swelling no erythema odor sign of infection.  Visit Diagnoses: No diagnosis found.  Plan: Dress the importance of elevation daily wound cleansing and dry dressings. No weightbearing. Follow-up in 2 weeks.  Follow-Up Instructions: Return in about 2 weeks (around 07/03/2017).   Imaging: No results found.  Orders:  No orders of the defined types were placed in this encounter.  No orders of the defined types were placed in this encounter.    PMFS History: Patient Active Problem List   Diagnosis Date Noted  . Acute osteomyelitis of right foot ([place]) 06/13/2017  . S/P transmetatarsal amputation of foot, right ([place]) 06/05/2017  . Dehiscence of external surgical wound 05/29/2017  . 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 ([place]) 05/19/2017  . H/O amputation of lesser toe, right ([place]) 05/19/2017  . PAD (peripheral artery disease) ([place])   . Sepsis due to other etiology ([place]) 04/06/2017  . CKD (chronic kidney disease), stage III 11/24/2016  . Suspected sleep apnea 11/24/2016  . Severe obesity (BMI >= 40) ([place]) 02/24/2016  . COPD GOLD 0  02/24/2016  . Chronic systolic CHF (congestive heart  failure) ([place]) 02/12/2016  . Encounter for therapeutic drug monitoring 02/10/2016  . Symptomatic bradycardia 01/12/2016  . Essential hypertension 12/22/2015  . Wheeze   . 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 Tobacco abuse 10/23/2015  . Coronary artery disease involving native coronary artery of native heart   . DOE (dyspnea on exertion) 04/29/2015  . PAF (paroxysmal atrial fibrillation) ([place]) 01/16/2015  . Carotid arterial disease ([place]) 01/16/2015  . Claudication ([place]) 01/15/2015  . Demand ischemia ([place]) 10/29/2014  . Insomnia 02/03/2014  . S/P peripheral artery angioplasty - TurboHawk atherectomy; R SFA 09/11/2013    Class: Acute  . Leg pain, bilateral 08/19/2013  . Hypothyroidism 07/31/2013  . Cellulitis 06/13/2013  . History of cocaine abuse 06/13/2013  . Long term current use of anticoagulant therapy 05/20/2013  . Alcohol abuse   . Narcotic abuse   . Marijuana abuse   . Alcoholic cirrhosis ([place])   . DM (diabetes mellitus), type 2 with peripheral vascular complications [facility])    Past Medical History:  Diagnosis Date  . Alcohol abuse   . Alcoholic cirrhosis ([place])   . Anemia   . Anxiety   . B12 deficiency   . CAD (coronary artery disease)    a. cath 11/10/2014 LM nl, LAD min irregs, D1 30 ost, D2 50d, LCX 36m, OM1 80 p/m (1.5 mm vessel), OM2 62m, RCA nondom 59m-->med rx.. Demand ischemia in the setting of rapid a-fib.  . Cardiomyopathy ([place])   .  Carotid artery disease ([place])    a. 01/2015 Carotid Angio: RICA 852, LICA 77O. s/p L carotid endarterectomy 02/2015.  [name] Chronic combined systolic and diastolic CHF (congestive heart failure) ([place])    a. Last echo 12/205: EF 40-45%, inferoapical/posterior HK, not technically sufficient to allow eval of LV diastolic function, normal LA in size..  . Cocaine abuse   . COPD (chronic obstructive pulmonary disease) ([place])   . Critical lower limb ischemia   . Depression   . Diabetic peripheral neuropathy ([place])   . Elevated troponin    a.  Chronic elevation.  [name] GERD (gastroesophageal reflux disease)   . Hyperlipemia   . Hypertension   . Hypokalemia   . Hypomagnesemia   . Hypothyroidism   . Marijuana abuse   . Narcotic abuse   . Noncompliance   . NSVT (nonsustained ventricular tachycardia) ([place])   . Obesity   . PAF (paroxysmal atrial fibrillation) ([place])   . Paroxysmal atrial tachycardia ([place])   . Paroxysmal atrial tachycardia ([place])   . Peripheral arterial disease ([place])    a. 01/2015 Angio/PTA: LSFA 100 w/ recon @ adductor canal and 1 vessel runoff via AT, RSFA 99 (atherectomy/pta) - 1 vessel runoff via diff dzs peroneal.  . Poorly controlled type 2 diabetes mellitus ([place])   . Renal insufficiency    a. 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  [name] Symptomatic bradycardia    avoid AV blocking agent per EP  . Tobacco abuse     Family History  Problem Relation Age of Onset  . Hypertension Mother   . Diabetes Mother   . Cancer Mother        breast, ovarian, colon  . Clotting disorder Mother   . Heart disease Mother   . Heart attack Mother   . Breast cancer Mother   . Hypertension Father   . Heart disease Father   . Emphysema Sister        smoked    Past Surgical History:  Procedure Laterality Date  . AMPUTATION Right 06/14/2017   Procedure: Right foot transmetatarsal amputation;  Surgeon: Newt Minion, MD;  Location: [facility];  Service: Orthopedics;  Laterality: Right;  . AMPUTATION TOE Right 04/28/2017   Procedure: AMPUTATION OF RIGHT SECOND RAY;  Surgeon: Newt Minion, MD;  Location: [facility];  Service: Orthopedics;  Laterality: Right;  . BALLOON ANGIOPLASTY, ARTERY Right 01/15/2015   SFA/notes 01/15/2015  . CARDIAC CATHETERIZATION    . CARDIAC CATHETERIZATION N/A 01/12/2016   Procedure: Temporary Wire;  Surgeon: Minus Breeding, MD;  Location: [place] CV LAB;  Service: Cardiovascular;  Laterality: N/A;  . CARDIOVERSION  ~ 02/2013   "twice"   . CAROTID ANGIOGRAM N/A 01/15/2015   Procedure: CAROTID ANGIOGRAM;  Surgeon: [name], MD;  [facility] LAB;  Service: Cardiovascular;  Laterality: N/A;  . DILATION AND CURETTAGE OF UTERUS  1988  . ENDARTERECTOMY Left 02/19/2015   Procedure: LEFT CAROTID ENDARTERECTOMY ;  Surgeon: Serafina Mitchell, MD;  Location: [facility];  Service: Vascular;  Laterality: Left;  . LEFT HEART CATHETERIZATION WITH CORONARY ANGIOGRAM N/A 10/31/2014   Procedure: LEFT HEART CATHETERIZATION WITH CORONARY ANGIOGRAM;  Surgeon: Burnell Blanks, MD;  Location: [facility] CATH LAB;  Service: Cardiovascular;  Laterality: N/A;  . LOWER EXTREMITY ANGIOGRAM N/A 09/10/2013   Procedure: LOWER EXTREMITY ANGIOGRAM;  Surgeon: [name], MD;  Location: [facility] CATH LAB;  Service: Cardiovascular;  Laterality: N/A;  . LOWER EXTREMITY ANGIOGRAM N/A 01/15/2015   Procedure: LOWER EXTREMITY ANGIOGRAM;  Surgeon: [name], MD;  Location: [facility] CATH LAB;  Service: Cardiovascular;  Laterality: N/A;  . LOWER EXTREMITY ANGIOGRAPHY N/A 04/13/2017   Procedure: Lower Extremity Angiography;  Surgeon: [name], MD;  Location: [place] CV LAB;  Service: Cardiovascular;  Laterality: N/A;  . PERIPHERAL ATHRECTOMY Right 01/15/2015   SFA/notes 01/15/2015  . PERIPHERAL VASCULAR INTERVENTION  04/13/2017   Procedure: Peripheral Vascular Intervention;  Surgeon: [name], MD;  Location: [place] CV LAB;  Service: Cardiovascular;;   Social History   Occupational History  . disabled    Social History Main Topics  . Smoking status: Former Smoker    Packs/day: 0.00    Years: 40.00    Types: E-cigarettes    Quit date: 04/05/2017  . Smokeless tobacco: Former Systems developer    Types: Snuff  . Alcohol use No     Comment: former alcohol abuse  . Drug use: No     Comment: 04/29/2015 "last drug use was ~ 09/08/2013"  . Sexual activity: No

## 2017-06-20 ENCOUNTER — Non-Acute Institutional Stay (SKILLED_NURSING_FACILITY): Payer: Medicare Other | Admitting: Internal Medicine

## 2017-06-20 ENCOUNTER — Encounter: Payer: Self-pay | Admitting: Internal Medicine

## 2017-06-20 DIAGNOSIS — R29818 Other symptoms and signs involving the nervous system: Secondary | ICD-10-CM

## 2017-06-20 DIAGNOSIS — Z9189 Other specified personal risk factors, not elsewhere classified: Secondary | ICD-10-CM | POA: Diagnosis not present

## 2017-06-20 DIAGNOSIS — E1151 Type 2 diabetes mellitus with diabetic peripheral angiopathy without gangrene: Secondary | ICD-10-CM

## 2017-06-20 DIAGNOSIS — G629 Polyneuropathy, unspecified: Secondary | ICD-10-CM | POA: Insufficient documentation

## 2017-06-20 DIAGNOSIS — G63 Polyneuropathy in diseases classified elsewhere: Secondary | ICD-10-CM | POA: Diagnosis not present

## 2017-06-20 DIAGNOSIS — M86171 Other acute osteomyelitis, right ankle and foot: Secondary | ICD-10-CM | POA: Diagnosis not present

## 2017-06-20 NOTE — Assessment & Plan Note (Signed)
Opiod risk with untreated sleep apnea was discussed frankly with the patient

## 2017-06-20 NOTE — Progress Notes (Signed)
NURSING HOME LOCATION:  [facility] ROOM NUMBER:  [id number]  CODE STATUS:  DNR  PCP:  Sandi Mariscal, [place] [address]   This is a comprehensive admission note to [facility] performed on this date less than 30 days from date of admission. Included are preadmission medical/surgical history;reconciled medication list; family history; social history and comprehensive review of systems.  Corrections and additions to the records were documented . Comprehensive physical exam was also performed. Additionally a clinical summary was entered for each active diagnosis pertinent to this admission in the Problem List to enhance continuity of care.  HPI: The patient was hospitalized 7/31-06/16/17 with acute osteomyelitis of the right foot in the context of uncontrolled diabetes with peripheral vascular complications requiring transmetatarsal amputation. The patient presented with tachycardia, leukocytosis, hypotension, & 2 week history of purulent drainage from her second right metatarsal amputation stump. Plain films and follow-up MRI revealed osteomyelitis of the right third metatarsal. Initially she received IV vancomycin and Zosyn.  Dr Duda,Orthopedic surgery performed a transmetatarsal amputation of her right foot. Upon return of negative blood cultures and clinical improvement parenteral antibiotics were changed to oral Augmentin. This was to be continued until 06/20/17. FOBT was positive by history;by hx she had a colonoscopy at [facility] one year ago with removal of polyps. Follow-up at one year was recommended .She can schedule this discharged.  The patient was on the opiod analgesic buprenorphine 75 mcg twice a day. This was held and opioids prescribed for 5 days. Patient was clinically depressed and PHQ9 was 19. Paxil 20 mg daily was initiated. Peripheral arterial disease was evaluated with ABI 7/6 revealing an ABI of 0.65 on the right and 0.31 on the  left. She's had a directional atherectomy and drug-eluting balloon angioplasty. She's had chronic systolic congestive heart failure with ejection fraction of 40-45 percent .Amiodarone was continued for paroxysmal atrial fibrillation. Labs prior to discharge revealed creatinine of 1.18, GFR 51 hemoglobin 9.1/hematocrit 29.8 ,and glucose 206. The last A1c on record in Epic was 11.1% on 10/15/15. Here at the SNF her glucoses have ranged from a low of 135 up to 428. Most of the highs appear to be in the afternoon. This is despite 25 units of NovoLog with each meal.   Past medical and surgical history:In addition to above includes noncompliance, narcotic abuse, marijuana abuse, cocaine abuse, dyslipidemia, GERD, COPD, B12 deficiency, and depression. She states that she has been documented with sleep apnea but was intolerant to CPAP.  surgical procedures are multiple and mainly relate to her peripheral vascular disease and cardiac disease.  Social history:Nondrinker at this time but history of alcohol abuse and hepatic cirrhosis. Quit smoking 04/05/17.  She formerly smoked up to 2 packs per day for over 40 years.  Family history: Reviewed  Review of systems: She describes severe pain in the right foot as "1000 needles". By history she's been intolerant or allergic to gabapentin and Lyrica. Her second major complaint is severe depression. As noted Paxil was initiated during hospitalization. She describes some itchy eyes. She has no other extrinsic symptoms. . Glucose at home ranged 169-545. She does not know her most recent A1c. She states her ophthalmologic exam is current. She has occasional dyspepsia.  Constitutional: No fever,significant weight change  Eyes: No redness, discharge, pain, vision change ENT/mouth: No nasal congestion,  purulent discharge, earache,change in hearing ,sore throat  Cardiovascular: No chest pain, palpitations,paroxysmal nocturnal dyspnea, claudication, edema  Respiratory: No  cough, sputum production,hemoptysis, DOE , significant snoring,apnea  Gastrointestinal: No heartburn,dysphagia,abdominal pain, nausea / vomiting,rectal bleeding, melena,change in bowels Genitourinary: No dysuria,hematuria, pyuria,  incontinence, nocturia Dermatologic: No rash, pruritus, change in appearance of skin Neurologic: No dizziness,headache,syncope, seizure Endocrine: No change in hair/skin/ nails, excessive thirst, excessive hunger, excessive urination  Hematologic/lymphatic: No significant bruising, lymphadenopathy,abnormal bleeding Allergy/immunology: No significant sneezing, urticaria, angioedema  Physical exam:  Pertinent or positive findings: She is obese. Heart sounds are distant. Breath sounds are decreased over the upper lobes posteriorly. Abdomen is massive.  Right foot is wrapped. She has some brawny mild erythema over the right lower shin. She has asymmetric edema of the lower extremities, greater on the left. She's wearing a support apparatus on the left foot. She has isolated DIP changes of osteoarthritis.  General appearance:Adequately nourished; no acute distress , increased work of breathing is present.   Lymphatic: No lymphadenopathy about the head, neck, axilla . Eyes: No conjunctival inflammation or lid edema is present. There is no scleral icterus. Ears:  External ear exam shows no significant lesions or deformities.   Nose:  External nasal examination shows no deformity or inflammation. Nasal mucosa are pink and moist without lesions ,exudates Oral exam: lips and gums are healthy appearing.There is no oropharyngeal erythema or exudate . Neck:  No thyromegaly, masses, tenderness noted.    Heart:  No murmur, click, rub .  Lungs:without wheezes, rhonchi,rales , rubs. Abdomen:Bowel sounds are normal. Abdomen is soft and nontender with no organomegaly, hernias,masses. GU: deferred  Extremities:  No cyanosis, clubbing  Neurologic exam : Balance,Rhomberg,finger to  nose testing could not be completed due to clinical state Skin: Warm & dry w/o tenting. No significant  rash.  See clinical summary under each active problem in the Problem List with associated updated therapeutic plan

## 2017-06-20 NOTE — Assessment & Plan Note (Signed)
Up-to-date A1c as last A1c in 2016 was 11.1% Adjust insulin as needed

## 2017-06-20 NOTE — Assessment & Plan Note (Signed)
Complete Augmentin as of today, monitor for any complications such as C. difficile

## 2017-06-20 NOTE — Assessment & Plan Note (Signed)
Change Paxil to Cymbalta and titrate Wean opioids due to the established risk and lack of impact on neuropathic pain

## 2017-06-21 ENCOUNTER — Encounter: Payer: Self-pay | Admitting: Internal Medicine

## 2017-06-21 LAB — HEMOGLOBIN A1C: HEMOGLOBIN A1C: 9.8

## 2017-06-21 NOTE — Assessment & Plan Note (Signed)
I explained  risk of over > 80 % of adverse reaction or possibly death if opioids are continued long-term. Patient was instructed the dose will be weaned . I have asked her to verify Buprenorphine prescriber; follow up post discharge should be pursued.

## 2017-06-21 NOTE — Patient Instructions (Signed)
See assessment and plan under each diagnosis in the problem list and acutely for this visit 

## 2017-07-03 ENCOUNTER — Ambulatory Visit (INDEPENDENT_AMBULATORY_CARE_PROVIDER_SITE_OTHER): Payer: Medicare Other | Admitting: Orthopedic Surgery

## 2017-07-03 VITALS — Ht 64.0 in | Wt 258.0 lb

## 2017-07-03 DIAGNOSIS — Z89431 Acquired absence of right foot: Secondary | ICD-10-CM

## 2017-07-03 NOTE — Progress Notes (Signed)
Post-Op Visit Note   Patient: [name]           Date of Birth: 03-08-1961           MRN: [id number] Visit Date: 07/03/2017 PCP: Sandi Mariscal, MD  Chief Complaint:  Chief Complaint  Patient presents with  . Routine Post Op    06/14/17 Rt transmet amp    HPI:  The patient is 56 year old woman who presents today 3 weeks status post right transmetatarsal amputation. She's been touchdown weightbearing through the heel the incision healing well. She wonders when she can get her orthotic made.    Ortho Exam Incision well approximated healing well no drainage erythema no sign of infection  Visit Diagnoses:  1. S/P transmetatarsal amputation of foot, right ([place])     Plan: Provided an orthotic prescription for custom orthotic with a spacer. May continue touchdown weightbearing through the heel sutures harvested today she'll follow-up in 2 more weeks.  Follow-Up Instructions: Return in about 2 weeks (around 07/17/2017).   Imaging: No results found.  Orders:  No orders of the defined types were placed in this encounter.  No orders of the defined types were placed in this encounter.    PMFS History: Patient Active Problem List   Diagnosis Date Noted  . At risk for adverse drug reaction 06/20/2017  . Peripheral neuropathy 06/20/2017  . Acute osteomyelitis of right foot ([place]) 06/13/2017  . S/P transmetatarsal amputation of foot, right ([place]) 06/05/2017  . 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 ([place]) 05/19/2017  . PAD (peripheral artery disease) ([place])   . Sepsis due to other etiology ([place]) 04/06/2017  . CKD (chronic kidney disease), stage III 11/24/2016  . Suspected sleep apnea 11/24/2016  . Severe obesity (BMI >= 40) ([place]) 02/24/2016  . COPD GOLD 0  02/24/2016  . Chronic systolic CHF (congestive heart failure) ([place]) 02/12/2016  . Encounter for therapeutic drug monitoring 02/10/2016  . Symptomatic bradycardia 01/12/2016    . Essential hypertension 12/22/2015  . Wheeze   . 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 Tobacco abuse 10/23/2015  . Coronary artery disease involving native coronary artery of native heart   . DOE (dyspnea on exertion) 04/29/2015  . PAF (paroxysmal atrial fibrillation) ([place]) 01/16/2015  . Carotid arterial disease ([place]) 01/16/2015  . Claudication ([place]) 01/15/2015  . Demand ischemia ([place]) 10/29/2014  . Insomnia 02/03/2014  . S/P peripheral artery angioplasty - TurboHawk atherectomy; R SFA 09/11/2013    Class: Acute  . Leg pain, bilateral 08/19/2013  . Hypothyroidism 07/31/2013  . Cellulitis 06/13/2013  . History of cocaine abuse 06/13/2013  . Long term current use of anticoagulant therapy 05/20/2013  . Alcohol abuse   . Narcotic abuse   . Marijuana abuse   . Alcoholic cirrhosis ([place])   . DM (diabetes mellitus), type 2 with peripheral vascular complications [facility])    Past Medical History:  Diagnosis Date  . Alcohol abuse   . Alcoholic cirrhosis ([place])   . Anemia   . Anxiety   . B12 deficiency   . CAD (coronary artery disease)    a. cath 11/10/2014 LM nl, LAD min irregs, D1 30 ost, D2 50d, LCX 41m, OM1 80 p/m (1.5 mm vessel), OM2 20m, RCA nondom 54m-->med rx.. Demand ischemia in the setting of rapid a-fib.  . Cardiomyopathy ([place])   . Carotid artery disease ([place])    a. 01/2015 Carotid Angio: RICA 850, LICA 27X. s/p L carotid endarterectomy 02/2015.  [name]  Chronic combined systolic and diastolic CHF (congestive heart failure) ([place])    a. Last echo 12/205: EF 40-45%, inferoapical/posterior HK, not technically sufficient to allow eval of LV diastolic function, normal LA in size..  . Cocaine abuse   . COPD (chronic obstructive pulmonary disease) ([place])   . Critical lower limb ischemia   . Depression   . Diabetic peripheral neuropathy ([place])   . Elevated troponin    a. Chronic elevation.  [name] GERD (gastroesophageal reflux disease)   . Hyperlipemia   . Hypertension   . Hypokalemia   .  Hypomagnesemia   . Hypothyroidism   . Marijuana abuse   . Narcotic abuse   . Noncompliance   . NSVT (nonsustained ventricular tachycardia) ([place])   . Obesity   . PAF (paroxysmal atrial fibrillation) ([place])   . Paroxysmal atrial tachycardia ([place])   . Peripheral arterial disease ([place])    a. 01/2015 Angio/PTA: LSFA 100 w/ recon @ adductor canal and 1 vessel runoff via AT, RSFA 99 (atherectomy/pta) - 1 vessel runoff via diff dzs peroneal.  . Poorly controlled type 2 diabetes mellitus ([place])   . Renal insufficiency    a. 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  [name] Symptomatic bradycardia    avoid AV blocking agent per EP  . Tobacco abuse     Family History  Problem Relation Age of Onset  . Hypertension Mother   . Diabetes Mother   . Cancer Mother        breast, ovarian, colon  . Clotting disorder Mother   . Heart disease Mother   . Heart attack Mother   . Breast cancer Mother   . Hypertension Father   . Heart disease Father   . Emphysema Sister        smoked    Past Surgical History:  Procedure Laterality Date  . AMPUTATION Right 06/14/2017   Procedure: Right foot transmetatarsal amputation;  Surgeon: Newt Minion, MD;  Location: [facility];  Service: Orthopedics;  Laterality: Right;  . AMPUTATION TOE Right 04/28/2017   Procedure: AMPUTATION OF RIGHT SECOND RAY;  Surgeon: Newt Minion, MD;  Location: [facility];  Service: Orthopedics;  Laterality: Right;  . BALLOON ANGIOPLASTY, ARTERY Right 01/15/2015   SFA/notes 01/15/2015  . CARDIAC CATHETERIZATION    . CARDIAC CATHETERIZATION N/A 01/12/2016   Procedure: Temporary Wire;  Surgeon: Minus Breeding, MD;  Location: [facility] CV LAB;  Service: Cardiovascular;  Laterality: N/A;  . CARDIOVERSION  ~ 02/2013   "twice"   . CAROTID ANGIOGRAM N/A 01/15/2015   Procedure: CAROTID ANGIOGRAM;  Surgeon: [name], MD;  Location: [facility] CATH LAB;  Service: Cardiovascular;  Laterality: N/A;  . DILATION AND CURETTAGE OF UTERUS  1988  . ENDARTERECTOMY Left 02/19/2015    Procedure: LEFT CAROTID ENDARTERECTOMY ;  Surgeon: Serafina Mitchell, MD;  Location: [place];  Service: Vascular;  Laterality: Left;  . LEFT HEART CATHETERIZATION WITH CORONARY ANGIOGRAM N/A 10/31/2014   Procedure: LEFT HEART CATHETERIZATION WITH CORONARY ANGIOGRAM;  Surgeon: Burnell Blanks, MD;  Location: [facility] CATH LAB;  Service: Cardiovascular;  Laterality: N/A;  . LOWER EXTREMITY ANGIOGRAM N/A 09/10/2013   Procedure: LOWER EXTREMITY ANGIOGRAM;  Surgeon: [name], MD;  Location: [facility] CATH LAB;  Service: Cardiovascular;  Laterality: N/A;  . LOWER EXTREMITY ANGIOGRAM N/A 01/15/2015   Procedure: LOWER EXTREMITY ANGIOGRAM;  Surgeon: [name], MD;  Location: [facility] CATH LAB;  Service: Cardiovascular;  Laterality: N/A;  . LOWER EXTREMITY ANGIOGRAPHY N/A 04/13/2017   Procedure: Lower Extremity  Angiography;  Surgeon: [name], MD;  Location: [place] CV LAB;  Service: Cardiovascular;  Laterality: N/A;  . PERIPHERAL ATHRECTOMY Right 01/15/2015   SFA/notes 01/15/2015  . PERIPHERAL VASCULAR INTERVENTION  04/13/2017   Procedure: Peripheral Vascular Intervention;  Surgeon: [name], MD;  Location: [place] CV LAB;  Service: Cardiovascular;;   Social History   Occupational History  . disabled    Social History Main Topics  . Smoking status: Former Smoker    Packs/day: 0.00    Years: 40.00    Types: E-cigarettes    Quit date: 04/05/2017  . Smokeless tobacco: Former Systems developer    Types: Snuff  . Alcohol use No     Comment: former alcohol abuse  . Drug use: Yes    Types: "Crack" cocaine, Marijuana, Oxycodone     Comment: 04/29/2015 "last drug use was ~ 09/08/2013"  . Sexual activity: No

## 2017-07-05 ENCOUNTER — Non-Acute Institutional Stay (SKILLED_NURSING_FACILITY): Payer: Medicare Other | Admitting: Adult Health

## 2017-07-05 ENCOUNTER — Encounter: Payer: Self-pay | Admitting: Adult Health

## 2017-07-05 DIAGNOSIS — K5901 Slow transit constipation: Secondary | ICD-10-CM

## 2017-07-05 DIAGNOSIS — B372 Candidiasis of skin and nail: Secondary | ICD-10-CM

## 2017-07-05 DIAGNOSIS — E1151 Type 2 diabetes mellitus with diabetic peripheral angiopathy without gangrene: Secondary | ICD-10-CM | POA: Diagnosis not present

## 2017-07-05 NOTE — Progress Notes (Signed)
DATE:  07/05/2017   MRN:  [id number]  BIRTHDAY: 07-05-1961  Facility:  Nursing Home Location:  [facility] Room Number: 295-J  LEVEL OF CARE:  SNF (31)  Contact Information    Name Relation Home Work Inez Sister   [phone]   [place]   [phone]       Code Status History    Date Active Date Inactive Code Status Order ID Comments User Context   06/13/2017 11:10 AM 06/16/2017  6:33 PM DNR [id number]  Zada Finders, MD ED   04/06/2017  9:43 PM 04/14/2017  4:11 PM Full Code [id number]  Corey Harold, NP ED   02/04/2016  1:15 AM 02/08/2016  9:38 PM Full Code [id number]  Alphia Moh, MD Inpatient   01/12/2016  8:18 PM 01/18/2016  3:51 PM Full Code [id number]  Isaiah Serge, NP Inpatient   11/24/2015  2:32 AM 12/03/2015  9:38 PM Full Code [id number]  Terressa Koyanagi, MD ED   11/07/2015  2:31 PM 11/09/2015  9:52 PM Full Code [id number]  Almyra Deforest, [place] [facility]   10/21/2015  2:55 AM 10/23/2015  6:00 PM Full Code [id number]  Alphia Moh, MD Inpatient   10/15/2015 11:13 AM 10/17/2015  4:31 PM Full Code [id number]  Eileen Stanford, PA-C ED   09/05/2015  1:00 AM 09/07/2015  5:38 PM Full Code [id number]  Etta Quill, DO ED   06/23/2015  5:11 AM 06/25/2015  3:46 PM Full Code [id number]  [name], MD ED   06/14/2015  8:26 AM 06/19/2015  4:47 PM Full Code [id number]  Theodis Blaze, MD Inpatient   04/29/2015 11:44 PM 05/03/2015  3:55 PM Full Code [id number]  [name], MD Inpatient   02/19/2015  4:35 PM 02/21/2015  1:11 PM Full Code [id number]  Ulyses Amor, PA-C Inpatient   01/15/2015  3:16 PM 01/16/2015  1:19 PM Full Code [id number]  [name], MD Inpatient   10/31/2014 12:53 PM 11/06/2014  3:27 PM Full Code [id number]  Burnell Blanks, MD Inpatient   10/29/2014  1:02 PM 10/31/2014  9:52 AM Full Code [id number]  Thayer Headings, MD Inpatient   09/07/2014  8:24 AM 09/09/2014  2:32 PM Full Code [id number]  Ivor Costa, MD Inpatient   06/16/2014  6:52 PM 06/18/2014  5:25 PM Full Code [id number]  Domenic Polite, MD Inpatient   02/01/2014  7:01 PM 02/05/2014  7:38 PM Full Code [id number]  Velvet Bathe, MD Inpatient    Questions for Most Recent Historical Code Status (Order [id number])    Question Answer Comment   In the event of cardiac or respiratory ARREST Do not call a "code blue"    In the event of cardiac or respiratory ARREST Do not perform Intubation, CPR, defibrillation or ACLS    In the event of cardiac or respiratory ARREST Use medication by any route, position, wound care, and other measures to relive pain and suffering. May use oxygen, suction and manual treatment of airway obstruction as needed for comfort.         Advance Directive Documentation     Most Recent Value  Type of Advance Directive  Healthcare Power of Attorney  Pre-existing out of facility DNR order (yellow form or pink MOST form)  -  "MOST" Form in Place?  -      Chief Complaint  Patient presents with  . Acute Visit    Bilateral groin itch  HISTORY OF PRESENT ILLNESS:  This is a 6-YO female seen for an acute visit due to complaints of bilateral groin itch.  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 She was seen today in the room. Noted bilateral groin with slight erythema. Denies vaginal itching nor dysuria. She complains of constipation. She has recently finished a course of Augmentin for Osteomyelitis and a recent S/P transmetatarsal amputation.  She is diabetic and CBGs has been noted to be high - 291, 340, 314, 571. She currently takes Novolog 25 units ac and Lantus 52 units Q HS.    PAST MEDICAL HISTORY:  Past Medical History:  Diagnosis Date  . Alcohol abuse   . Alcoholic cirrhosis ([place])   . Anemia   . Anxiety   . B12 deficiency   . CAD (coronary artery disease)    a. cath 11/10/2014 LM nl, LAD min  irregs, D1 30 ost, D2 50d, LCX 22m, OM1 80 p/m (1.5 mm vessel), OM2 16m, RCA nondom 57m-->med rx.. Demand ischemia in the setting of rapid a-fib.  . Cardiomyopathy ([place])   . Carotid artery disease ([place])    a. 01/2015 Carotid Angio: RICA 557, LICA 32K. s/p L carotid endarterectomy 02/2015.  [name] Chronic combined systolic and diastolic CHF (congestive heart failure) ([place])    a. Last echo 12/205: EF 40-45%, inferoapical/posterior HK, not technically sufficient to allow eval of LV diastolic function, normal LA in size..  . Cocaine abuse   . COPD (chronic obstructive pulmonary disease) ([place])   . Critical lower limb ischemia   . Depression   . Diabetic peripheral neuropathy ([place])   . Elevated troponin    a. Chronic elevation.  [name] GERD (gastroesophageal reflux disease)   . Hyperlipemia   . Hypertension   . Hypokalemia   . Hypomagnesemia   . Hypothyroidism   . Marijuana abuse   . Narcotic abuse   . Noncompliance   . NSVT (nonsustained ventricular tachycardia) ([place])   . Obesity   . PAF (paroxysmal atrial fibrillation) ([place])   . Paroxysmal atrial tachycardia ([place])   . Peripheral arterial disease ([place])    a. 01/2015 Angio/PTA: LSFA 100 w/ recon @ adductor canal and 1 vessel runoff via AT, RSFA 99 (atherectomy/pta) - 1 vessel runoff via diff dzs peroneal.  . Poorly controlled type 2 diabetes mellitus ([place])   . Renal insufficiency    a. 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  [name] Symptomatic bradycardia    avoid AV blocking agent per EP  . Tobacco abuse      CURRENT MEDICATIONS: Reviewed  Patient's Medications  New Prescriptions   No medications on file  Previous Medications   ACETAMINOPHEN (TYLENOL) 325 MG TABLET    Take 650 mg by mouth every 6 (six) hours as needed for mild pain or moderate pain.   ALBUTEROL (PROVENTIL HFA;VENTOLIN HFA) 108 (90 BASE) MCG/ACT INHALER    Inhale 2 puffs into the lungs every 6 (six) hours as needed for wheezing or shortness of breath.   AMINO ACIDS-PROTEIN HYDROLYS (FEEDING  SUPPLEMENT, PRO-STAT SUGAR FREE 64,) LIQD    Take 30 mLs by mouth 2 (two) times daily.   AMIODARONE (PACERONE) 100 MG TABLET    Take 1 tablet (100 mg total) by mouth daily.   AMLODIPINE (NORVASC) 10 MG TABLET    TAKE ONE TABLET BY MOUTH ONCE DAILY   APIXABAN (ELIQUIS) 5 MG TABS TABLET    Take  1 tablet (5 mg total) by mouth 2 (two) times daily.   ATORVASTATIN (LIPITOR) 40 MG TABLET    TAKE 1 TABLET BY MOUTH ONCE EVERY EVENING   BISOPROLOL (ZEBETA) 5 MG TABLET    TAKE ONE-HALF ( 2.5MG  TOTAL) TABLET BY MOUTH DAILY   CHOLECALCIFEROL (VITAMIN D) 1000 UNITS TABLET    Take 1,000 Units by mouth daily.   CLOPIDOGREL (PLAVIX) 75 MG TABLET    Take 1 tablet (75 mg total) by mouth daily with breakfast.   CYANOCOBALAMIN 1000 MCG TABLET    Take 1 tablet (1,000 mcg total) by mouth daily.   DOCUSATE SODIUM (COLACE) 100 MG CAPSULE    Take 1 capsule (100 mg total) by mouth 2 (two) times daily.   DULOXETINE (CYMBALTA) 60 MG CAPSULE    Take 60 mg by mouth daily.   FOLIC ACID (FOLVITE) 1 MG TABLET    TAKE 1 TABLET BY MOUTH EVERY DAY   INSULIN ASPART (NOVOLOG FLEXPEN) 100 UNIT/ML FLEXPEN    Inject 25 Units into the skin 3 (three) times daily with meals.   INSULIN GLARGINE (LANTUS SOLOSTAR) 100 UNIT/ML SOLOSTAR PEN    Inject 52 Units into the skin. Daily at 8PM   LEVOTHYROXINE (SYNTHROID, LEVOTHROID) 50 MCG TABLET    Take 1 tablet (50 mcg total) by mouth daily.   LOSARTAN (COZAAR) 25 MG TABLET    TAKE ONE TABLET BY MOUTH ONCE DAILY   METOLAZONE (ZAROXOLYN) 2.5 MG TABLET    TAKE 1 TABLE EVERY WEEK ON THURSDAY. MAY TAKE ONE ADDITIONAL TABLET AS DIRECTED BY HEART FAILURE CLINIC   MULTIPLE VITAMIN (MULTIVITAMIN) TABLET    Take 1 tablet by mouth daily.   OXYCODONE-ACETAMINOPHEN (PERCOCET/ROXICET) 5-325 MG TABLET    Take 1 tablet by mouth. Take 1 tab q12h prn thru 8/23 and then 1 tablet qhs prn thru 8/31   POTASSIUM CHLORIDE SA (K-DUR,KLOR-CON) 20 MEQ TABLET    Take 1 tablet (20 mEq total) by mouth daily. Take an additional 20  meq (1 tab) on Thursday with metolazone   SPIRONOLACTONE (ALDACTONE) 25 MG TABLET    Take 1 tablet (25 mg total) by mouth daily.   SYMBICORT 160-4.5 MCG/ACT INHALER    Inhale 2 puffs into the lungs 2 (two) times daily.   TORSEMIDE (DEMADEX) 20 MG TABLET    Take 2 tablets (40 mg total) by mouth 2 (two) times daily.   TRAZODONE (DESYREL) 100 MG TABLET    Take 100 mg by mouth at bedtime.  Modified Medications   No medications on file  Discontinued Medications   AMOXICILLIN-CLAVULANATE (AUGMENTIN) 875-125 MG TABLET    Take 1 tablet by mouth every 12 (twelve) hours.   INSULIN GLARGINE (LANTUS SOLOSTAR) 100 UNIT/ML SOLOSTAR PEN    Inject 48 Units into the skin daily at 10 pm.   PAROXETINE (PAXIL) 20 MG TABLET    Take 1 tablet (20 mg total) by mouth daily.     Allergies  Allergen Reactions  . Gabapentin Nausea And Vomiting    POSSIBLE SHAKING? TAKING MED CURRENTLY  . Lyrica [Pregabalin] Other (See Comments)    shaking     REVIEW OF SYSTEMS:  GENERAL: no change in appetite, no fatigue, no weight changes, no fever, chills or weakness SKIN:  Bilateral groin pruritus MOUTH and THROAT: Denies oral discomfort, gingival pain or bleeding, pain from teeth or hoarseness   RESPIRATORY: no cough, SOB, DOE, wheezing, hemoptysis CARDIAC: no chest pain, edema or palpitations GI: +constipation GU: Denies dysuria, frequency, hematuria, incontinence,  or discharge PSYCHIATRIC: Denies feeling of depression or anxiety. No report of hallucinations, insomnia, paranoia, or agitation     PHYSICAL EXAMINATION  GENERAL APPEARANCE: Well nourished. In no acute distress. Morbidly obese SKIN:  Bilateral groin with mild erythema , R foot with dressing MOUTH and THROAT: Lips are without lesions. Oral mucosa is moist and without lesions. Tongue is normal in shape, size, and color and without lesions RESPIRATORY: breathing is even & unlabored, BS CTAB CARDIAC: RRR, no murmur,no extra heart sounds, no edema GI:  abdomen soft, normal BS, no masses, no tenderness, no hepatomegaly, no splenomegaly EXTREMITIES:  Able to move X 4 extremities PSYCHIATRIC: Alert and oriented X 3. Affect and behavior are appropriate   LABS/RADIOLOGY: Labs reviewed: Basic Metabolic Panel:  Recent Labs  06/13/17 0910 06/14/17 0231 06/15/17 0701 06/19/17  NA 134* 134* 136 140  K 4.7 4.3 4.5 4.1  CL 94* 102 103  --   CO2 26 26 26   --   GLUCOSE 332* 287* 206*  --   BUN 56* 35* 19 15  CREATININE 1.87* 1.35* 1.18* 1.1  CALCIUM 9.1 8.0* 8.1*  --    Liver Function Tests:  Recent Labs  04/28/17 1324 06/13/17 0910 06/14/17 0231  AST 23 42* 26  ALT 27 36 28  ALKPHOS 127* 137* 115  BILITOT 0.4 0.5 0.6  PROT 7.9 7.2 5.9*  ALBUMIN 3.1* 3.0* 2.3*    Recent Labs  03/15/17 1832  LIPASE 34   CBC:  Recent Labs  06/13/17 0910 06/14/17 0231 06/15/17 0701 06/19/17  WBC 23.2* 15.8* 11.4* 12.9  NEUTROABS 20.8*  --  8.7* 9  HGB 10.4* 9.2* 9.1* 10.5*  HCT 32.5* 29.9* 29.8* 33*  MCV 89.8 91.2 92.8  --   PLT 315 225 200 251   Cardiac Enzymes:  Recent Labs  04/06/17 2325  TROPONINI 0.06*   CBG:  Recent Labs  06/15/17 2236 06/16/17 0439 06/16/17 1148  GLUCAP 256* 197* 296*      Dg Chest 2 View  Result Date: 06/13/2017 CLINICAL DATA:  Fever EXAM: CHEST  2 VIEW COMPARISON:  04/07/2017 FINDINGS: Heart and mediastinal contours are within normal limits. No focal opacities or effusions. No acute bony abnormality. IMPRESSION: No active cardiopulmonary disease. Electronically Signed   By: Rolm Baptise M.D.   On: 06/13/2017 09:56   Mr Foot Right Wo Contrast  Result Date: 06/13/2017 CLINICAL DATA:  Status post amputation of the right second toe and distal metatarsal on 04/28/2017 with purulent drainage from the surgical wound. Clinical evidence of sepsis. Findings concerning for possible osteomyelitis involving the base of the third proximal phalanx on right foot radiographs obtained earlier today. There is  also a fracture of the stump of the right second metatarsal on the radiographs. Diabetes. EXAM: MRI OF THE RIGHT FOREFOOT WITHOUT CONTRAST TECHNIQUE: Multiplanar, multisequence MR imaging of the right foot was performed. No intravenous contrast was administered. COMPARISON:  Right foot radiographs obtained earlier today and on 04/06/2017. FINDINGS: Bones/Joint/Cartilage Limited by artifacts produced by patient motion and lack of intravenous contrast. There is edema in the distal aspect of the second metatarsal stump at the location of the recently demonstrated fracture. There is also edema in the third metatarsal head an neck with some focal lucency in the lateral aspect of the third metatarsal head on the recent radiographs. There is also mild edema in the laterally subluxed third proximal phalanx. Ligaments Not well assessed. Muscles and Tendons Diffuse muscular edema. Soft tissues Diffuse edema. IMPRESSION: 1.  Limited examination by patient motion and lack of intravenous contrast. 2. Findings suspicious for osteomyelitis involving the third metatarsal head and neck and possibly involving the proximal aspect of the third proximal phalanx. 3. Edema in the distal aspect of the second metatarsal stump. This is compatible with edema associated with the fracture at that location. 4. Diffuse soft tissue edema. Electronically Signed   By: Claudie Revering M.D.   On: 06/13/2017 18:15   Dg Foot Complete Right  Result Date: 06/13/2017 CLINICAL DATA:  Nonhealing wound of the left foot. Status post amputation of the second toe and distal second metatarsal. EXAM: RIGHT FOOT COMPLETE - 3+ VIEW COMPARISON:  Radiographs dated 04/06/2017 and 03/21/2017 FINDINGS: There is a fracture of the stump of the second metatarsal. There is some periosteal reaction. There is new subluxation at the third and MTP joint into lucency in the head of the third metatarsal with slight irregularity of the base of the proximal phalanx of the third toe.  Slight chronic degenerative changes of the first MTP joint. IMPRESSION: 1. Fracture of the stump of the second metatarsal. 2. New subluxation at the third MTP joint. 3. Slight irregularity of the cortex of the base of the proximal phalanx of the third toe which could represent osteomyelitis. Electronically Signed   By: Lorriane Shire M.D.   On: 06/13/2017 10:01    ASSESSMENT/PLAN:  1. Candidal skin infection - start Nystatin 100,000 units/g apply topically to bilateral groin Q shift X 2 weeks, keep skin clean and dry  2. DM (diabetes mellitus), type 2 with peripheral vascular complications ([name]) - increase Lantus 100 units/ml from 52 units to 58 units SQ Q HS and continue Novolog 100 units/ml 25 units SQ AC Lab Results  Component Value Date   HGBA1C 9.8 06/21/2017     3. Slow transit constipation - start Senna-S 2 tabs PO BID        [name] - NP    [employer] [phone]

## 2017-07-18 ENCOUNTER — Non-Acute Institutional Stay (SKILLED_NURSING_FACILITY): Payer: Medicare Other | Admitting: Adult Health

## 2017-07-18 ENCOUNTER — Encounter: Payer: Self-pay | Admitting: Adult Health

## 2017-07-18 DIAGNOSIS — I739 Peripheral vascular disease, unspecified: Secondary | ICD-10-CM | POA: Diagnosis not present

## 2017-07-18 DIAGNOSIS — I480 Paroxysmal atrial fibrillation: Secondary | ICD-10-CM

## 2017-07-18 DIAGNOSIS — E039 Hypothyroidism, unspecified: Secondary | ICD-10-CM | POA: Diagnosis not present

## 2017-07-18 DIAGNOSIS — I5022 Chronic systolic (congestive) heart failure: Secondary | ICD-10-CM

## 2017-07-18 DIAGNOSIS — E1151 Type 2 diabetes mellitus with diabetic peripheral angiopathy without gangrene: Secondary | ICD-10-CM | POA: Diagnosis not present

## 2017-07-18 DIAGNOSIS — J189 Pneumonia, unspecified organism: Secondary | ICD-10-CM | POA: Diagnosis not present

## 2017-07-18 DIAGNOSIS — I10 Essential (primary) hypertension: Secondary | ICD-10-CM

## 2017-07-18 DIAGNOSIS — J449 Chronic obstructive pulmonary disease, unspecified: Secondary | ICD-10-CM

## 2017-07-18 DIAGNOSIS — Z89431 Acquired absence of right foot: Secondary | ICD-10-CM | POA: Diagnosis not present

## 2017-07-18 DIAGNOSIS — G63 Polyneuropathy in diseases classified elsewhere: Secondary | ICD-10-CM

## 2017-07-18 NOTE — Progress Notes (Signed)
DATE:  07/18/2017   MRN:  [id number]  BIRTHDAY: 1961/11/13  Facility:  Nursing Home Location:  [facility] Room Number: 297-L  LEVEL OF CARE:  SNF (31)  Contact Information    Name Relation Home Work Pound Sister   [phone]   [place]   [phone]       Code Status History    Date Active Date Inactive Code Status Order ID Comments User Context   06/13/2017 11:10 AM 06/16/2017  6:33 PM DNR [id number]  Zada Finders, MD ED   04/06/2017  9:43 PM 04/14/2017  4:11 PM Full Code [id number]  Corey Harold, NP ED   02/04/2016  1:15 AM 02/08/2016  9:38 PM Full Code [id number]  Alphia Moh, MD Inpatient   01/12/2016  8:18 PM 01/18/2016  3:51 PM Full Code [id number]  Isaiah Serge, NP Inpatient   11/24/2015  2:32 AM 12/03/2015  9:38 PM Full Code [id number]  Terressa Koyanagi, MD ED   11/07/2015  2:31 PM 11/09/2015  9:52 PM Full Code [id number]  Almyra Deforest, [place] Inpatient   10/21/2015  2:55 AM 10/23/2015  6:00 PM Full Code [id number]  Alphia Moh, MD Inpatient   10/15/2015 11:13 AM 10/17/2015  4:31 PM Full Code [id number]  Eileen Stanford, PA-C ED   09/05/2015  1:00 AM 09/07/2015  5:38 PM Full Code [id number]  Etta Quill, DO ED   06/23/2015  5:11 AM 06/25/2015  3:46 PM Full Code [id number]  [name], MD ED   06/14/2015  8:26 AM 06/19/2015  4:47 PM Full Code [id number]  Theodis Blaze, MD Inpatient   04/29/2015 11:44 PM 05/03/2015  3:55 PM Full Code [id number]  [name], MD Inpatient   02/19/2015  4:35 PM 02/21/2015  1:11 PM Full Code [id number]  Ulyses Amor, PA-C Inpatient   01/15/2015  3:16 PM 01/16/2015  1:19 PM Full Code [id number]  [name], MD Inpatient   10/31/2014 12:53 PM 11/06/2014  3:27 PM Full Code [id number]  Burnell Blanks, MD Inpatient   10/29/2014  1:02 PM 10/31/2014  9:52 AM Full Code [id number]  Thayer Headings, MD Inpatient   09/07/2014  8:24 AM 09/09/2014  2:32 PM Full Code [id number]  Ivor Costa,  MD Inpatient   06/16/2014  6:52 PM 06/18/2014  5:25 PM Full Code [id number]  Domenic Polite, MD Inpatient   02/01/2014  7:01 PM 02/05/2014  7:38 PM Full Code [id number]  Velvet Bathe, MD Inpatient    Questions for Most Recent Historical Code Status (Order [id number])    Question Answer Comment   In the event of cardiac or respiratory ARREST Do not call a "code blue"    In the event of cardiac or respiratory ARREST Do not perform Intubation, CPR, defibrillation or ACLS    In the event of cardiac or respiratory ARREST Use medication by any route, position, wound care, and other measures to relive pain and suffering. May use oxygen, suction and manual treatment of airway obstruction as needed for comfort.         Advance Directive Documentation     Most Recent Value  Type of Advance Directive  Out of facility DNR (pink MOST or yellow form)  Pre-existing out of facility DNR order (yellow form or pink MOST form)  -  "MOST" Form in Place?  -       Chief Complaint  Patient presents with  . Medical Management of Chronic  Issues    Routine visit    HISTORY OF PRESENT ILLNESS:  This is a 49-YO female seen for a routine visit. 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 She was seen in the room today. She complained of coughing with whitish phlegm. No fever reported. She was noted to have wheezing on her left lower lung. Chest x-ray revealed mild left basilar infiltrate. Review of chest x-ray film by Dr. Linna Darner showed  Her C elevated left hemidiaphragm oval density RLL triangular shaped tissue density LLL medially. BS were noted to be consistently high - 515, 332,366, 566, 459, 444.  Lantus was recently increased to 58 units Q HS.   She has recently finished a course of Augmentin for Osteomyelitis and a recent S/P transmetatarsal amputation. She is currently having a short-term rehabilitation.  PAST  MEDICAL HISTORY:  Past Medical History:  Diagnosis Date  . Alcohol abuse   . Alcoholic cirrhosis ([place])   . Anemia   . Anxiety   . B12 deficiency   . CAD (coronary artery disease)    a. cath 11/10/2014 LM nl, LAD min irregs, D1 30 ost, D2 50d, LCX 20m, OM1 80 p/m (1.5 mm vessel), OM2 79m, RCA nondom 68m-->med rx.. Demand ischemia in the setting of rapid a-fib.  . Cardiomyopathy ([place])   . Carotid artery disease ([place])    a. 01/2015 Carotid Angio: RICA 810, LICA 17P. s/p L carotid endarterectomy 02/2015.  [name] Chronic combined systolic and diastolic CHF (congestive heart failure) ([place])    a. Last echo 12/205: EF 40-45%, inferoapical/posterior HK, not technically sufficient to allow eval of LV diastolic function, normal LA in size..  . Cocaine abuse   . COPD (chronic obstructive pulmonary disease) ([place])   . Critical lower limb ischemia   . Depression   . Diabetic peripheral neuropathy ([place])   . Elevated troponin    a. Chronic elevation.  [name] GERD (gastroesophageal reflux disease)   . Hyperlipemia   . Hypertension   . Hypokalemia   . Hypomagnesemia   . Hypothyroidism   . Marijuana abuse   . Narcotic abuse   . Noncompliance   . NSVT (nonsustained ventricular tachycardia) ([name])   . Obesity   . PAF (paroxysmal atrial fibrillation) ([place])   . Paroxysmal atrial tachycardia ([place])   . Peripheral arterial disease ([place])    a. 01/2015 Angio/PTA: LSFA 100 w/ recon @ adductor canal and 1 vessel runoff via AT, RSFA 99 (atherectomy/pta) - 1 vessel runoff via diff dzs peroneal.  . Poorly controlled type 2 diabetes mellitus ([place])   . Renal insufficiency    a. 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  [name] Symptomatic bradycardia    avoid AV blocking agent per EP  . Tobacco abuse      CURRENT MEDICATIONS: Reviewed  Patient's Medications  New Prescriptions   No medications on file  Previous Medications   ACETAMINOPHEN (TYLENOL) 325 MG TABLET    Take 650 mg by mouth. Take 650 mg q6h prn and 650 mg BID scheduled    ALBUTEROL (PROVENTIL HFA;VENTOLIN HFA) 108 (90 BASE) MCG/ACT INHALER    Inhale 2 puffs into the lungs every 6 (six) hours as needed for wheezing or shortness of breath.   AMINO ACIDS-PROTEIN HYDROLYS (FEEDING SUPPLEMENT, PRO-STAT SUGAR FREE 64,) LIQD    Take 30 mLs by mouth 2 (two) times daily.   AMIODARONE (PACERONE) 100 MG TABLET    Take 1 tablet (  100 mg total) by mouth daily.   AMLODIPINE (NORVASC) 10 MG TABLET    TAKE ONE TABLET BY MOUTH ONCE DAILY   APIXABAN (ELIQUIS) 5 MG TABS TABLET    Take 1 tablet (5 mg total) by mouth 2 (two) times daily.   ATORVASTATIN (LIPITOR) 40 MG TABLET    TAKE 1 TABLET BY MOUTH ONCE EVERY EVENING   BISOPROLOL (ZEBETA) 5 MG TABLET    TAKE ONE-HALF ( 2.5MG  TOTAL) TABLET BY MOUTH DAILY   CHOLECALCIFEROL (VITAMIN D) 1000 UNITS TABLET    Take 1,000 Units by mouth daily.   CLOPIDOGREL (PLAVIX) 75 MG TABLET    Take 1 tablet (75 mg total) by mouth daily with breakfast.   CYANOCOBALAMIN 1000 MCG TABLET    Take 1 tablet (1,000 mcg total) by mouth daily.   DEXTROMETHORPHAN-GUAIFENESIN (DIABETIC TUSSIN DM PO)    Take 10 mLs by mouth 3 (three) times daily.   DOCUSATE SODIUM (COLACE) 100 MG CAPSULE    Take 1 capsule (100 mg total) by mouth 2 (two) times daily.   DULOXETINE (CYMBALTA) 60 MG CAPSULE    Take 60 mg by mouth daily.   FOLIC ACID (FOLVITE) 1 MG TABLET    TAKE 1 TABLET BY MOUTH EVERY DAY   INSULIN ASPART (NOVOLOG FLEXPEN) 100 UNIT/ML FLEXPEN    Inject 25 Units into the skin 3 (three) times daily with meals.   INSULIN GLARGINE (LANTUS SOLOSTAR) 100 UNIT/ML SOLOSTAR PEN    Inject 58 Units into the skin. Daily at 8PM    LEVOTHYROXINE (SYNTHROID, LEVOTHROID) 50 MCG TABLET    Take 1 tablet (50 mcg total) by mouth daily.   LOSARTAN (COZAAR) 25 MG TABLET    TAKE ONE TABLET BY MOUTH ONCE DAILY   METOLAZONE (ZAROXOLYN) 2.5 MG TABLET    TAKE 1 TABLE EVERY WEEK ON THURSDAY. MAY TAKE ONE ADDITIONAL TABLET AS DIRECTED BY HEART FAILURE CLINIC   MULTIPLE VITAMIN (MULTIVITAMIN) TABLET     Take 1 tablet by mouth daily.   NYSTATIN CREAM (MYCOSTATIN)    Apply 1 application topically daily. Apply to groin for itching/redness   POTASSIUM CHLORIDE SA (K-DUR,KLOR-CON) 20 MEQ TABLET    Take 1 tablet (20 mEq total) by mouth daily. Take an additional 20 meq (1 tab) on Thursday with metolazone   SENNA-DOCUSATE (SENOKOT-S) 8.6-50 MG TABLET    Take 2 tablets by mouth 2 (two) times daily.   SPIRONOLACTONE (ALDACTONE) 25 MG TABLET    Take 1 tablet (25 mg total) by mouth daily.   SYMBICORT 160-4.5 MCG/ACT INHALER    Inhale 2 puffs into the lungs 2 (two) times daily.   TORSEMIDE (DEMADEX) 20 MG TABLET    Take 2 tablets (40 mg total) by mouth 2 (two) times daily.   TRAZODONE (DESYREL) 100 MG TABLET    Take 100 mg by mouth at bedtime.  Modified Medications   No medications on file  Discontinued Medications   OXYCODONE-ACETAMINOPHEN (PERCOCET/ROXICET) 5-325 MG TABLET    Take 1 tablet by mouth. Take 1 tab q12h prn thru 8/23 and then 1 tablet qhs prn thru 8/31     Allergies  Allergen Reactions  . Gabapentin Nausea And Vomiting    POSSIBLE SHAKING? TAKING MED CURRENTLY  . Lyrica [Pregabalin] Other (See Comments)    shaking     REVIEW OF SYSTEMS:  GENERAL: no change in appetite, no fatigue, no weight changes, no fever, chills or weakness MOUTH and THROAT: Denies oral discomfort, gingival pain or bleeding, pain from teeth  or hoarseness   RESPIRATORY: no SOB, DOE, hemoptysis, CARDIAC: no chest pain, edema or palpitations GI: no abdominal pain, diarrhea, constipation, heart burn, nausea or vomiting GU: Denies dysuria, frequency, hematuria, incontinence, or discharge PSYCHIATRIC: Denies feeling of depression or anxiety. No report of hallucinations, insomnia, paranoia, or agitation    PHYSICAL EXAMINATION  GENERAL APPEARANCE: Well nourished. In no acute distress. Morbidly obese SKIN:  Right foot covered with dry dressing MOUTH and THROAT: Lips are without lesions. Oral mucosa is moist  and without lesions. Tongue is normal in shape, size, and color and without lesions RESPIRATORY: breathing is even & unlabored, +wheezing on LLL CARDIAC: no murmur,no extra heart sounds, no edema GI: abdomen soft, normal BS, no masses, no tenderness, no hepatomegaly, no splenomegaly EXTREMITIES:  Able to move X 4 extremities PSYCHIATRIC: Alert and oriented X 3. Affect and behavior are appropriate   LABS/RADIOLOGY: Labs reviewed: Basic Metabolic Panel:  Recent Labs  06/13/17 0910 06/14/17 0231 06/15/17 0701 06/19/17  NA 134* 134* 136 140  K 4.7 4.3 4.5 4.1  CL 94* 102 103  --   CO2 26 26 26   --   GLUCOSE 332* 287* 206*  --   BUN 56* 35* 19 15  CREATININE 1.87* 1.35* 1.18* 1.1  CALCIUM 9.1 8.0* 8.1*  --    Liver Function Tests:  Recent Labs  04/28/17 1324 06/13/17 0910 06/14/17 0231  AST 23 42* 26  ALT 27 36 28  ALKPHOS 127* 137* 115  BILITOT 0.4 0.5 0.6  PROT 7.9 7.2 5.9*  ALBUMIN 3.1* 3.0* 2.3*    Recent Labs  03/15/17 1832  LIPASE 34   CBC:  Recent Labs  06/13/17 0910 06/14/17 0231 06/15/17 0701 06/19/17  WBC 23.2* 15.8* 11.4* 12.9  NEUTROABS 20.8*  --  8.7* 9  HGB 10.4* 9.2* 9.1* 10.5*  HCT 32.5* 29.9* 29.8* 33*  MCV 89.8 91.2 92.8  --   PLT 315 225 200 251   Cardiac Enzymes:  Recent Labs  04/06/17 2325  TROPONINI 0.06*   CBG:  Recent Labs  06/15/17 2236 06/16/17 0439 06/16/17 1148  GLUCAP 256* 197* 296*      ASSESSMENT/PLAN:  1. HCAP (healthcare-associated pneumonia) - CXR showing Mild left basilar infiltration, Dr. Linna Darner reviewed chest x-ray films showing left hemidiaphragm oval density RLL triangular shaped tissue density LLL medially, will start on doxycycline 100 mg 1 tab by mouth twice a day 10 days and  Florastor 250 mg 1 capsule by mouth twice a day 13 days, albuterol 2.5 mg/3 mL 1 neck every 6 A, 2P and 10P 1 week, provide incentive spirometer and encouraged to use hourly while awake    2. S/P transmetatarsal  amputation of foot, right ([place]) -  continue wound treatment, rehabilitation with PT and OT for therapeutic strengthening exercises; fall precautions, acetaminophen 325 mg 2 tabs = 650 mg by mouth twice a day for pain, follow-up with orthopedics   3. DM (diabetes mellitus), type 2 with peripheral vascular complications ([name]) - uncontrolled, increase Lantus 100 units/mL from 58 units to 64 units subcutaneous daily at bedtime, continue NovoLog 100 units/mL 25 units subcutaneous before meals daily Lab Results  Component Value Date   HGBA1C 9.8 06/21/2017     4. Chronic systolic CHF (congestive heart failure) ([name])  -  Will check BNP, continue torsemide 20 mg 1 tab twice a day, metolazone 2.5 mg 1 tab once a week on Thursdays, losartan 25 mg 1 tab daily,  Bisoprolol Fumarate 5 mg 1/2 tab =  2.5 mg daily, Spironolactone 25 mg 1 tab daily   5. Polyneuropathy associated with underlying disease ([place]) - continue Cymbalta 60 mg 1 capsule daily   6. Essential hypertension - Well-controlled; continue amlodipine 10 mg 1 tab daily, losartan 25 mg 1 tab daily and Bisoprolool Fumarate 5 mg 1/2 tab = 2.5 mg daily   7. COPD GOLD 0  -  + wheezing,CXR showed left basilar infiltrate and will start on doxycycline, continue Proventil when necessary and Symbicort twice a day   8. PAF (paroxysmal atrial fibrillation) ([name]) - rate controlled; continue Bisoprolol Fumarate 5 mg give 1/2 tab = 2.5 mg daily, amiodarone 100 mg 1 tab daily, and Eliquis 5 mg 1 tab twice a day      9. PAD (peripheral artery disease) ([name]) - Plavix 75 mg daily   10. Hypothyroidism -  continue levothyroxine 50 g 1 tab daily Lab Results  Component Value Date   TSH 3.486 11/11/2016       Goals of care:  Short-term rehabilitation   Sholonda Jobst C. [place] - NP    [employer] [phone]

## 2017-07-19 LAB — TSH: TSH: 3.08 (ref 0.41–5.90)

## 2017-07-20 ENCOUNTER — Ambulatory Visit (INDEPENDENT_AMBULATORY_CARE_PROVIDER_SITE_OTHER): Payer: Medicare Other | Admitting: Orthopedic Surgery

## 2017-07-20 ENCOUNTER — Encounter: Payer: Self-pay | Admitting: Internal Medicine

## 2017-07-20 ENCOUNTER — Non-Acute Institutional Stay (SKILLED_NURSING_FACILITY): Payer: Medicare Other | Admitting: Internal Medicine

## 2017-07-20 DIAGNOSIS — E1151 Type 2 diabetes mellitus with diabetic peripheral angiopathy without gangrene: Secondary | ICD-10-CM

## 2017-07-20 DIAGNOSIS — Z89431 Acquired absence of right foot: Secondary | ICD-10-CM

## 2017-07-20 DIAGNOSIS — R938 Abnormal findings on diagnostic imaging of other specified body structures: Secondary | ICD-10-CM

## 2017-07-20 DIAGNOSIS — R9389 Abnormal findings on diagnostic imaging of other specified body structures: Secondary | ICD-10-CM

## 2017-07-20 DIAGNOSIS — G63 Polyneuropathy in diseases classified elsewhere: Secondary | ICD-10-CM | POA: Diagnosis not present

## 2017-07-20 DIAGNOSIS — L97919 Non-pressure chronic ulcer of unspecified part of right lower leg with unspecified severity: Secondary | ICD-10-CM

## 2017-07-20 DIAGNOSIS — I87333 Chronic venous hypertension (idiopathic) with ulcer and inflammation of bilateral lower extremity: Secondary | ICD-10-CM

## 2017-07-20 DIAGNOSIS — L97929 Non-pressure chronic ulcer of unspecified part of left lower leg with unspecified severity: Secondary | ICD-10-CM

## 2017-07-20 DIAGNOSIS — F3341 Major depressive disorder, recurrent, in partial remission: Secondary | ICD-10-CM

## 2017-07-20 NOTE — Progress Notes (Signed)
Office Visit Note   Patient: [name]           Date of Birth: 29-Jun-1961           MRN: [id number] Visit Date: 07/20/2017              Requested by: Sandi Mariscal, [place], [place] [address] PCP: Sandi Mariscal, MD  Chief Complaint  Patient presents with  . Right Foot - Routine Post Op    06/14/17 right foot transmetatarsal amputation 36 days post op      HPI: Patient is a 56 year old woman who is 4 weeks status post right foot transmetatarsal amputation she has no complaints she is finishing up a course of doxycycline for possible pneumonia. She complains of nerve pain in both lower extremities she states he is allergic to gabapentin and Lyrica.  Assessment & Plan: Visit Diagnoses:  1. S/P transmetatarsal amputation of foot, right ([place])     Plan: Patient will need to wear compression stockings at all times a prescription was written for 15-20 mm knee-high compression stockings. She may advance weightbearing as tolerated for physical therapy. I feel most of her leg pain is due to the swelling.  Follow-Up Instructions: Return in about 4 weeks (around 08/17/2017).   Ortho Exam  Patient is alert, oriented, no adenopathy, well-dressed, normal affect, normal respiratory effort. Examination patient has brawny skin color changes in both legs with pitting edema and dermatitis consistent with venous insufficiency there is no drainage no open ulcers. Her foot is plantigrade on the right the wounds have completely healed the heel ulcers of healed the forefoot ulcers are healed. There is no redness no cellulitis no signs of infection.  Imaging: No results found. No images are attached to the encounter.  Labs: Lab Results  Component Value Date   HGBA1C 9.8 06/21/2017   HGBA1C 11.1 (H) 10/15/2015   HGBA1C 12.8 (H) 09/05/2015   LABURIC 15.0 (H) 11/26/2015   REPTSTATUS 06/14/2017 FINAL 06/13/2017   GRAMSTAIN  11/24/2015    FEW WBC PRESENT, PREDOMINANTLY  MONONUCLEAR RARE SQUAMOUS EPITHELIAL CELLS PRESENT NO ORGANISMS SEEN Performed at [facility]    CULT NO MRSA DETECTED 06/13/2017   LABORGA ESCHERICHIA COLI (A) 04/06/2017    Orders:  No orders of the defined types were placed in this encounter.  No orders of the defined types were placed in this encounter.    Procedures: No procedures performed  Clinical Data: No additional findings.  ROS:  All other systems negative, except as noted in the HPI. Review of Systems  Objective: Vital Signs: LMP 11/27/2012   Specialty Comments:  No specialty comments available.  PMFS History: Patient Active Problem List   Diagnosis Date Noted  . At risk for adverse drug reaction 06/20/2017  . Peripheral neuropathy 06/20/2017  . Acute osteomyelitis of right foot ([name]) 06/13/2017  . S/P transmetatarsal amputation of foot, right ([place]) 06/05/2017  . 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 ([place]) 05/19/2017  . PAD (peripheral artery disease) ([place])   . Sepsis due to other etiology ([place]) 04/06/2017  . CKD (chronic kidney disease), stage III 11/24/2016  . Suspected sleep apnea 11/24/2016  . Severe obesity (BMI >= 40) ([place]) 02/24/2016  . COPD GOLD 0  02/24/2016  . Chronic systolic CHF (congestive heart failure) ([place]) 02/12/2016  . Encounter for therapeutic drug monitoring 02/10/2016  . Symptomatic bradycardia 01/12/2016  . Essential hypertension 12/22/2015  . Wheeze   .  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 Tobacco abuse 10/23/2015  . Coronary artery disease involving native coronary artery of native heart   . DOE (dyspnea on exertion) 04/29/2015  . PAF (paroxysmal atrial fibrillation) ([place]) 01/16/2015  . Carotid arterial disease ([place]) 01/16/2015  . Claudication ([place]) 01/15/2015  . Demand ischemia ([place]) 10/29/2014  . Insomnia 02/03/2014  . S/P peripheral artery angioplasty - [place] atherectomy; R SFA 09/11/2013    Class:  Acute  . Leg pain, bilateral 08/19/2013  . Hypothyroidism 07/31/2013  . Cellulitis 06/13/2013  . History of cocaine abuse 06/13/2013  . Long term current use of anticoagulant therapy 05/20/2013  . Alcohol abuse   . Narcotic abuse   . Marijuana abuse   . Alcoholic cirrhosis ([place])   . DM (diabetes mellitus), type 2 with peripheral vascular complications [facility])    Past Medical History:  Diagnosis Date  . Alcohol abuse   . Alcoholic cirrhosis ([place])   . Anemia   . Anxiety   . B12 deficiency   . CAD (coronary artery disease)    a. cath 11/10/2014 LM nl, LAD min irregs, D1 30 ost, D2 50d, LCX 56m, OM1 80 p/m (1.5 mm vessel), OM2 25m, RCA nondom 11m-->med rx.. Demand ischemia in the setting of rapid a-fib.  . Cardiomyopathy ([place])   . Carotid artery disease ([place])    a. 01/2015 Carotid Angio: RICA 623, LICA 76E. s/p L carotid endarterectomy 02/2015.  [name] Chronic combined systolic and diastolic CHF (congestive heart failure) ([place])    a. Last echo 12/205: EF 40-45%, inferoapical/posterior HK, not technically sufficient to allow eval of LV diastolic function, normal LA in size..  . Cocaine abuse   . COPD (chronic obstructive pulmonary disease) ([place])   . Critical lower limb ischemia   . Depression   . Diabetic peripheral neuropathy ([place])   . Elevated troponin    a. Chronic elevation.  [name] GERD (gastroesophageal reflux disease)   . Hyperlipemia   . Hypertension   . Hypokalemia   . Hypomagnesemia   . Hypothyroidism   . Marijuana abuse   . Narcotic abuse   . Noncompliance   . NSVT (nonsustained ventricular tachycardia) ([place])   . Obesity   . PAF (paroxysmal atrial fibrillation) ([place])   . Paroxysmal atrial tachycardia ([place])   . Peripheral arterial disease ([place])    a. 01/2015 Angio/PTA: LSFA 100 w/ recon @ adductor canal and 1 vessel runoff via AT, RSFA 99 (atherectomy/pta) - 1 vessel runoff via diff dzs peroneal.  . Poorly controlled type 2 diabetes mellitus ([place])   . Renal insufficiency    a.  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  [name] Symptomatic bradycardia    avoid AV blocking agent per EP  . Tobacco abuse     Family History  Problem Relation Age of Onset  . Hypertension Mother   . Diabetes Mother   . Cancer Mother        breast, ovarian, colon  . Clotting disorder Mother   . Heart disease Mother   . Heart attack Mother   . Breast cancer Mother   . Hypertension Father   . Heart disease Father   . Emphysema Sister        smoked    Past Surgical History:  Procedure Laterality Date  . AMPUTATION Right 06/14/2017   Procedure: Right foot transmetatarsal amputation;  Surgeon: Newt Minion, MD;  Location: [facility];  Service: Orthopedics;  Laterality: Right;  . AMPUTATION TOE Right 04/28/2017   Procedure: AMPUTATION OF  RIGHT SECOND RAY;  Surgeon: Newt Minion, MD;  Location: [facility];  Service: Orthopedics;  Laterality: Right;  . BALLOON ANGIOPLASTY, ARTERY Right 01/15/2015   SFA/notes 01/15/2015  . CARDIAC CATHETERIZATION    . CARDIAC CATHETERIZATION N/A 01/12/2016   Procedure: Temporary Wire;  Surgeon: Minus Breeding, MD;  Location: [facility] CV LAB;  Service: Cardiovascular;  Laterality: N/A;  . CARDIOVERSION  ~ 02/2013   "twice"   . CAROTID ANGIOGRAM N/A 01/15/2015   Procedure: CAROTID ANGIOGRAM;  Surgeon: [name], MD;  Location: [facility] CATH LAB;  Service: Cardiovascular;  Laterality: N/A;  . DILATION AND CURETTAGE OF UTERUS  1988  . ENDARTERECTOMY Left 02/19/2015   Procedure: LEFT CAROTID ENDARTERECTOMY ;  Surgeon: Serafina Mitchell, MD;  Location: [facility];  Service: Vascular;  Laterality: Left;  . LEFT HEART CATHETERIZATION WITH CORONARY ANGIOGRAM N/A 10/31/2014   Procedure: LEFT HEART CATHETERIZATION WITH CORONARY ANGIOGRAM;  Surgeon: Burnell Blanks, MD;  Location: [facility] CATH LAB;  Service: Cardiovascular;  Laterality: N/A;  . LOWER EXTREMITY ANGIOGRAM N/A 09/10/2013   Procedure: LOWER EXTREMITY ANGIOGRAM;  Surgeon: [name], MD;  Location: [facility] CATH LAB;  Service: Cardiovascular;   Laterality: N/A;  . LOWER EXTREMITY ANGIOGRAM N/A 01/15/2015   Procedure: LOWER EXTREMITY ANGIOGRAM;  Surgeon: [name], MD;  Location: [facility] CATH LAB;  Service: Cardiovascular;  Laterality: N/A;  . LOWER EXTREMITY ANGIOGRAPHY N/A 04/13/2017   Procedure: Lower Extremity Angiography;  Surgeon: [name], MD;  Location: [place] CV LAB;  Service: Cardiovascular;  Laterality: N/A;  . PERIPHERAL ATHRECTOMY Right 01/15/2015   SFA/notes 01/15/2015  . PERIPHERAL VASCULAR INTERVENTION  04/13/2017   Procedure: Peripheral Vascular Intervention;  Surgeon: [name], MD;  Location: [facility] CV LAB;  Service: Cardiovascular;;   Social History   Occupational History  . disabled    Social History Main Topics  . Smoking status: Former Smoker    Packs/day: 0.00    Years: 40.00    Types: E-cigarettes    Quit date: 04/05/2017  . Smokeless tobacco: Former Systems developer    Types: Snuff  . Alcohol use No     Comment: former alcohol abuse  . Drug use: Yes    Types: "Crack" cocaine, Marijuana, Oxycodone     Comment: 04/29/2015 "last drug use was ~ 09/08/2013"  . Sexual activity: No

## 2017-07-20 NOTE — Assessment & Plan Note (Signed)
AC insulin will be increased by 5 units Read all labels ;avoid foods & drinks with  High Fructose Corn Syrup as #1, 2 , 3 or # 4 on label. (Note : dividing the "grams of sugar" on label by 4 gives "teaspoons of sugar" content of food or drink. For example a 22 oz Coke has 68 grams of sugar or 17 tsp of sugar).

## 2017-07-20 NOTE — Progress Notes (Signed)
NURSING HOME LOCATION:  [facility] ROOM NUMBER:  [id number]  CODE STATUS:  DNR  PCP:  Sandi Mariscal, [place] [address]  This is a nursing facility follow up for specific acute issue of possible bilateral pneumonia  Interim medical record and care since last [place] visit was updated with review of diagnostic studies and change in clinical status since last visit were documented.  HPI: The patient was being seen in routine follow-up for chronic issues on 07/18/17.She validated having cough.with white phlegm w/o fever. On exam wheezing was noted in the left lower lung. Chest x-ray revealed triangular-shaped left basilar infiltrate medially and oval density in the right lower lobe. These findings are superimposed on COPD GOLD 0 Doxycycline twice a day was initiated for 10 days with pulmonary toilet. Incentive spirometry was encouraged every hour while awake. Glucoses were noted to be consistently high with ranges from 332 up to 566 recently despite 58 u Lantus. Lantus was increased to 64 units with continuation of NovoLog 25 units before meals. Since that visit 9/4 glucoses have improved but still remain elevated. The range is from 275 up to 428. The patient had recently completed a course of Augmentin for osteomyelitis and transmetatarsal amputation she is here at the SNF for short-term rehabilitation. Dr Sharol Given has allowed some weight bearing as of today.  Review of systems:  The patient denies all cardio pulmonary symptoms except for cough with scant yellow sputum. She denies any upper respiratory tract symptoms or extrinsic symptoms. Her main concern is radicular pain in the right lower extremity with position change such as rolling over in bed. She has persistent burning and stinging in her feet. Gabapentin has caused sleepiness and she feels this "caused seizures". She continues to have "some depression".  Constitutional: No fever,significant weight  change, fatigue  Eyes: No redness, discharge, pain, vision change ENT/mouth: No nasal congestion,  purulent discharge, earache,change in hearing ,sore throat  Cardiovascular: No chest pain, palpitations,paroxysmal nocturnal dyspnea, claudication, edema  Respiratory: No hemoptysis, significant snoring,apnea  Gastrointestinal: No heartburn,dysphagia,abdominal pain, nausea / vomiting,rectal bleeding, melena,change in bowels Genitourinary: No dysuria,hematuria, pyuria,  incontinence, nocturia Dermatologic: No rash, pruritus, change in appearance of skin Endocrine: No change in hair/skin/ nails, excessive thirst, excessive hunger, excessive urination  Hematologic/lymphatic: No significant bruising, lymphadenopathy,abnormal bleeding Allergy/immunology: No itchy/ watery eyes, significant sneezing, urticaria, angioedema  Physical exam:  Pertinent or positive findings: The patient is alert and appears well. She has slight anisocoria, the right pupil is bigger than the left. They're both reactive to light. She is edentulous. Breath sounds are decreased, she has minor rales at the bases. Abdomen is protuberant. There is accentuated curvature of the upper thoracic spine. The right lower extremity is wrapped but is obviously edematous. Evidence of amputation of the toes is visible as a wrapped stump. She has mild erythema over the left shin. There is minor abrasion of L shin that has had some bleeding which was stopped. Pulses in the left foot are decreased . General appearance:Adequately nourished; no acute distress , increased work of breathing is present.   Lymphatic: No lymphadenopathy about the head, neck, axilla . Eyes: No conjunctival inflammation or lid edema is present. There is no scleral icterus. Ears:  External ear exam shows no significant lesions or deformities.   Nose:  External nasal examination shows no deformity or inflammation. Nasal mucosa are pink and moist without lesions ,exudates Oral  exam: lips and gums are healthy appearing.There is  no oropharyngeal erythema or exudate . Neck:  No thyromegaly, masses, tenderness noted.    Heart:  Normal rate and regular rhythm. S1 and S2 normal without gallop, murmur, click, rub .  Lungs: without wheezes, rhonchi, rubs. Abdomen:Bowel sounds are normal. Abdomen is soft and nontender with no organomegaly, hernias,masses. GU: deferred  Extremities:  No cyanosis, clubbing  Neurologic exam : Balance,Rhomberg,finger to nose testing could not be completed due to clinical state Skin: Warm & dry w/o tenting. No significant rash.  #1 abnormal chest x-ray suggesting bilateral pneumonia versus atelectasis. The latter is more likely in view of her clinical state on exam 9/6. She has residual cellulitic changes in the lower extremities, the doxycycline will be continued. #2 diabetes is severely uncontrolled. Pre-meal insulins will be increased. I discussed dietary interventions concerning HFCS with her. #3 peripheral neuropathy.  Possible increase in Cymbalta dose to address #3 & #4 will be discussed with Psych NP #4 depresssion, see #3

## 2017-07-21 ENCOUNTER — Encounter: Payer: Self-pay | Admitting: Internal Medicine

## 2017-07-21 DIAGNOSIS — F32A Depression, unspecified: Secondary | ICD-10-CM | POA: Insufficient documentation

## 2017-07-21 DIAGNOSIS — F329 Major depressive disorder, single episode, unspecified: Secondary | ICD-10-CM | POA: Insufficient documentation

## 2017-07-21 NOTE — Assessment & Plan Note (Signed)
Possibly increase Cymbalta to 90 mg

## 2017-07-21 NOTE — Patient Instructions (Signed)
See assessment and plan under each diagnosis in the problem list and acutely for this visit 

## 2017-07-21 NOTE — Assessment & Plan Note (Signed)
Discuss increase in Cymbalta dose with Psych NP

## 2017-07-24 ENCOUNTER — Non-Acute Institutional Stay (SKILLED_NURSING_FACILITY): Payer: Medicare Other | Admitting: Adult Health

## 2017-07-24 ENCOUNTER — Encounter: Payer: Self-pay | Admitting: Adult Health

## 2017-07-24 DIAGNOSIS — R600 Localized edema: Secondary | ICD-10-CM

## 2017-07-24 NOTE — Progress Notes (Signed)
DATE:  07/24/2017   MRN:  [id number]  BIRTHDAY: [birth year]  Facility:  Nursing Home Location:  [facility] Room Number: 209-O  LEVEL OF CARE:  SNF (31)  Contact Information    Name Relation Home Work [place] Sister   [phone]   [place]   [phone]       Code Status History    Date Active Date Inactive Code Status Order ID Comments User Context   06/13/2017 11:10 AM 06/16/2017  6:33 PM DNR [id number]  Zada Finders, MD ED   04/06/2017  9:43 PM 04/14/2017  4:11 PM Full Code [id number]  Corey Harold, NP ED   02/04/2016  1:15 AM 02/08/2016  9:38 PM Full Code [id number]  Alphia Moh, MD Inpatient   01/12/2016  8:18 PM 01/18/2016  3:51 PM Full Code [id number]  Isaiah Serge, NP Inpatient   11/24/2015  2:32 AM 12/03/2015  9:38 PM Full Code [id number]  Terressa Koyanagi, MD ED   11/07/2015  2:31 PM 11/09/2015  9:52 PM Full Code [id number]  Almyra Deforest, [place] Inpatient   10/21/2015  2:55 AM 10/23/2015  6:00 PM Full Code [id number]  Alphia Moh, MD Inpatient   10/15/2015 11:13 AM 10/17/2015  4:31 PM Full Code [id number]  Eileen Stanford, PA-C ED   09/05/2015  1:00 AM 09/07/2015  5:38 PM Full Code [id number]  Etta Quill, DO ED   06/23/2015  5:11 AM 06/25/2015  3:46 PM Full Code [id number]  [name], MD ED   06/14/2015  8:26 AM 06/19/2015  4:47 PM Full Code [id number]  Theodis Blaze, MD Inpatient   04/29/2015 11:44 PM 05/03/2015  3:55 PM Full Code [id number]  [name], MD Inpatient   02/19/2015  4:35 PM 02/21/2015  1:11 PM Full Code [id number]  Ulyses Amor, PA-C Inpatient   01/15/2015  3:16 PM 01/16/2015  1:19 PM Full Code [id number]  [name], MD Inpatient   10/31/2014 12:53 PM 11/06/2014  3:27 PM Full Code [id number]  Burnell Blanks, MD Inpatient   10/29/2014  1:02 PM 10/31/2014  9:52 AM Full Code [id number]  Thayer Headings, MD Inpatient   09/07/2014  8:24 AM 09/09/2014  2:32 PM Full Code [id number]  Ivor Costa, MD Inpatient   06/16/2014  6:52 PM 06/18/2014  5:25 PM Full Code [id number]  Domenic Polite, MD Inpatient   02/01/2014  7:01 PM 02/05/2014  7:38 PM Full Code [id number]  Velvet Bathe, MD Inpatient    Questions for Most Recent Historical Code Status (Order [id number])    Question Answer Comment   In the event of cardiac or respiratory ARREST Do not call a "code blue"    In the event of cardiac or respiratory ARREST Do not perform Intubation, CPR, defibrillation or ACLS    In the event of cardiac or respiratory ARREST Use medication by any route, position, wound care, and other measures to relive pain and suffering. May use oxygen, suction and manual treatment of airway obstruction as needed for comfort.        Chief Complaint  Patient presents with  . Acute Visit    Clear drainage of left leg    HISTORY OF PRESENT ILLNESS:  This is a 60-YO female seen for an acute visit secondary to clear drainage from her left leg.  She is a short-term rehabilitation resident at [facility].  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 She was seen in her room today with sister @ bedside. She was noted to have clear drainage seeping from her left leg. Left leg is edematous, 2+. She is currently on Doxycycline for HCAP. No fever has been noted.    [name]   PAST MEDICAL HISTORY:  Past Medical History:  Diagnosis Date  . Alcohol abuse   . Alcoholic cirrhosis ([place])   . Anemia   . Anxiety   . B12 deficiency   . CAD (coronary artery disease)    a. cath 11/10/2014 LM nl, LAD min irregs, D1 30 ost, D2 50d, LCX 18m, OM1 80 p/m (1.5 mm vessel), OM2 68m, RCA nondom 20m-->med rx.. Demand ischemia in the setting of rapid a-fib.  . Cardiomyopathy ([place])   . Carotid artery disease ([place])    a. 01/2015 Carotid Angio: RICA 875, LICA 64P. s/p L carotid endarterectomy 02/2015.  [name] Chronic combined  systolic and diastolic CHF (congestive heart failure) ([place])    a. Last echo 12/205: EF 40-45%, inferoapical/posterior HK, not technically sufficient to allow eval of LV diastolic function, normal LA in size..  . Cocaine abuse   . COPD (chronic obstructive pulmonary disease) ([place])   . Critical lower limb ischemia   . Depression   . Diabetic peripheral neuropathy ([place])   . Elevated troponin    a. Chronic elevation.  [name] GERD (gastroesophageal reflux disease)   . Hyperlipemia   . Hypertension   . Hypokalemia   . Hypomagnesemia   . Hypothyroidism   . Marijuana abuse   . Narcotic abuse   . Noncompliance   . NSVT (nonsustained ventricular tachycardia) ([place])   . Obesity   . PAF (paroxysmal atrial fibrillation) ([place])   . Paroxysmal atrial tachycardia ([place])   . Peripheral arterial disease ([place])    a. 01/2015 Angio/PTA: LSFA 100 w/ recon @ adductor canal and 1 vessel runoff via AT, RSFA 99 (atherectomy/pta) - 1 vessel runoff via diff dzs peroneal.  . Poorly controlled type 2 diabetes mellitus ([place])   . Renal insufficiency    a. 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  [name] Symptomatic bradycardia    avoid AV blocking agent per EP  . Tobacco abuse      CURRENT MEDICATIONS: Reviewed  Patient's Medications  New Prescriptions   No medications on file  Previous Medications   ACETAMINOPHEN (TYLENOL) 325 MG TABLET    Take 650 mg by mouth. Take 650 mg q6h prn and 650 mg BID scheduled   ALBUTEROL (PROVENTIL HFA;VENTOLIN HFA) 108 (90 BASE) MCG/ACT INHALER    Inhale 2 puffs into the lungs every 6 (six) hours as needed for wheezing or shortness of breath.   ALBUTEROL (PROVENTIL) (2.5 MG/3ML) 0.083% NEBULIZER SOLUTION    Take 2.5 mg by nebulization every 8 (eight) hours. Take at 6AM, 2PM, 10PM   AMINO ACIDS-PROTEIN HYDROLYS (FEEDING SUPPLEMENT, PRO-STAT SUGAR FREE 64,) LIQD    Take 30 mLs by mouth 2 (two) times daily.   AMIODARONE (PACERONE) 100 MG TABLET    Take 1 tablet (100 mg total) by mouth daily.   AMLODIPINE  (NORVASC) 10 MG TABLET    TAKE ONE TABLET BY MOUTH ONCE DAILY   APIXABAN (ELIQUIS) 5 MG TABS TABLET    Take 1 tablet (5 mg total) by mouth 2 (two) times daily.   ATORVASTATIN (LIPITOR) 40 MG TABLET    TAKE 1 TABLET BY MOUTH ONCE EVERY EVENING   BISOPROLOL (ZEBETA)  5 MG TABLET    TAKE ONE-HALF ( 2.5MG  TOTAL) TABLET BY MOUTH DAILY   CHOLECALCIFEROL (VITAMIN D) 1000 UNITS TABLET    Take 1,000 Units by mouth daily.   CLOPIDOGREL (PLAVIX) 75 MG TABLET    Take 1 tablet (75 mg total) by mouth daily with breakfast.   CYANOCOBALAMIN 1000 MCG TABLET    Take 1 tablet (1,000 mcg total) by mouth daily.   DOCUSATE SODIUM (COLACE) 100 MG CAPSULE    Take 1 capsule (100 mg total) by mouth 2 (two) times daily.   DOXYCYCLINE (VIBRA-TABS) 100 MG TABLET    Take 100 mg by mouth 2 (two) times daily.   DULOXETINE (CYMBALTA) 60 MG CAPSULE    Take 60 mg by mouth daily.   FOLIC ACID (FOLVITE) 1 MG TABLET    TAKE 1 TABLET BY MOUTH EVERY DAY   INSULIN ASPART (NOVOLOG) 100 UNIT/ML INJECTION    Inject 30 Units into the skin 3 (three) times daily before meals.   INSULIN GLARGINE (LANTUS SOLOSTAR) 100 UNIT/ML SOLOSTAR PEN    Inject 64 Units into the skin. Daily at 8PM    LEVOTHYROXINE (SYNTHROID, LEVOTHROID) 50 MCG TABLET    Take 1 tablet (50 mcg total) by mouth daily.   LOSARTAN (COZAAR) 25 MG TABLET    TAKE ONE TABLET BY MOUTH ONCE DAILY   METOLAZONE (ZAROXOLYN) 2.5 MG TABLET    TAKE 1 TABLE EVERY WEEK ON THURSDAY. MAY TAKE ONE ADDITIONAL TABLET AS DIRECTED BY HEART FAILURE CLINIC   MULTIPLE VITAMIN (MULTIVITAMIN) TABLET    Take 1 tablet by mouth daily.   POTASSIUM CHLORIDE SA (K-DUR,KLOR-CON) 20 MEQ TABLET    Take 1 tablet (20 mEq total) by mouth daily. Take an additional 20 meq (1 tab) on Thursday with metolazone   SACCHAROMYCES BOULARDII (FLORASTOR) 250 MG CAPSULE    Take 250 mg by mouth 2 (two) times daily.   SENNA-DOCUSATE (SENOKOT-S) 8.6-50 MG TABLET    Take 2 tablets by mouth 2 (two) times daily.   SPIRONOLACTONE  (ALDACTONE) 25 MG TABLET    Take 1 tablet (25 mg total) by mouth daily.   SYMBICORT 160-4.5 MCG/ACT INHALER    Inhale 2 puffs into the lungs 2 (two) times daily.   TORSEMIDE (DEMADEX) 20 MG TABLET    Take 2 tablets (40 mg total) by mouth 2 (two) times daily.   TRAZODONE (DESYREL) 100 MG TABLET    Take 100 mg by mouth at bedtime.  Modified Medications   No medications on file  Discontinued Medications   INSULIN ASPART (NOVOLOG FLEXPEN) 100 UNIT/ML FLEXPEN    Inject 25 Units into the skin 3 (three) times daily with meals.     Allergies  Allergen Reactions  . Gabapentin Nausea And Vomiting    POSSIBLE SHAKING? TAKING MED CURRENTLY  . Lyrica [Pregabalin] Other (See Comments)    shaking     REVIEW OF SYSTEMS:  GENERAL: no change in appetite, no fatigue, no weight changes, no fever, chills or weakness MOUTH and THROAT: Denies oral discomfort, gingival pain or bleeding, pain from teeth or hoarseness   RESPIRATORY: no cough, SOB, DOE, wheezing, hemoptysis CARDIAC: no chest pain or palpitations GI: no abdominal pain, diarrhea, constipation, heart burn, nausea or vomiting GU: Denies dysuria, frequency, hematuria, incontinence, or discharge PSYCHIATRIC: Denies feeling of depression or anxiety. No report of hallucinations, insomnia, paranoia, or agitation     PHYSICAL EXAMINATION  GENERAL APPEARANCE: Well nourished. In no acute distress. Morbidly obese SKIN:  Left heel wound  covered with dressing, Right foot covered with dry dressing MOUTH and THROAT: Lips are without lesions. Oral mucosa is moist and without lesions. Tongue is normal in shape, size, and color and without lesions RESPIRATORY: breathing is even & unlabored, BS CTAB CARDIAC: no murmur,no extra heart sounds, BLE 2+ edema GI: abdomen soft, normal BS, no masses, no tenderness, no hepatomegaly, no splenomegaly EXTREMITIES: Able to move X 4 extremities PSYCHIATRIC: Alert and oriented X 3. Affect and behavior are  appropriate   LABS/RADIOLOGY: Labs reviewed: Basic Metabolic Panel:  Recent Labs  06/13/17 0910 06/14/17 0231 06/15/17 0701 06/19/17  NA 134* 134* 136 140  K 4.7 4.3 4.5 4.1  CL 94* 102 103  --   CO2 26 26 26   --   GLUCOSE 332* 287* 206*  --   BUN 56* 35* 19 15  CREATININE 1.87* 1.35* 1.18* 1.1  CALCIUM 9.1 8.0* 8.1*  --    Liver Function Tests:  Recent Labs  04/28/17 1324 06/13/17 0910 06/14/17 0231  AST 23 42* 26  ALT 27 36 28  ALKPHOS 127* 137* 115  BILITOT 0.4 0.5 0.6  PROT 7.9 7.2 5.9*  ALBUMIN 3.1* 3.0* 2.3*    Recent Labs  03/15/17 1832  LIPASE 34   CBC:  Recent Labs  06/13/17 0910 06/14/17 0231 06/15/17 0701 06/19/17  WBC 23.2* 15.8* 11.4* 12.9  NEUTROABS 20.8*  --  8.7* 9  HGB 10.4* 9.2* 9.1* 10.5*  HCT 32.5* 29.9* 29.8* 33*  MCV 89.8 91.2 92.8  --   PLT 315 225 200 251   Cardiac Enzymes:  Recent Labs  04/06/17 2325  TROPONINI 0.06*   CBG:  Recent Labs  06/15/17 2236 06/16/17 0439 06/16/17 1148  GLUCAP 256* 197* 296*      ASSESSMENT/PLAN:  1. Edema of lower extremity - start Torsemide 20 mg 2 tabs = 40mg  PO Q AM X 3 days and 20 mg Q PM then back to Torsemide 20 mg BID and add KCL ER 20 meq 1 tab PO Q PM X 3 days, continue KCL ER 20 meq 1 tab PO Q D and additional KCL ER 20 meq on Thursdays when she gets Metolazone 2.5 mg PO Q Thursdays, continue Spironolactone 25 mg 1 tab daily    Vencil Basnett C. [place] - NP    [employer] [phone]

## 2017-07-25 ENCOUNTER — Telehealth (INDEPENDENT_AMBULATORY_CARE_PROVIDER_SITE_OTHER): Payer: Self-pay | Admitting: Orthopedic Surgery

## 2017-07-25 NOTE — Telephone Encounter (Signed)
07/03/2017 OV NOTE FAXED TO Alison Stalling [phone]

## 2017-08-09 ENCOUNTER — Non-Acute Institutional Stay (SKILLED_NURSING_FACILITY): Payer: Medicare Other | Admitting: Adult Health

## 2017-08-09 ENCOUNTER — Encounter: Payer: Self-pay | Admitting: Adult Health

## 2017-08-09 DIAGNOSIS — D689 Coagulation defect, unspecified: Secondary | ICD-10-CM

## 2017-08-09 DIAGNOSIS — F3341 Major depressive disorder, recurrent, in partial remission: Secondary | ICD-10-CM | POA: Diagnosis not present

## 2017-08-09 DIAGNOSIS — E1151 Type 2 diabetes mellitus with diabetic peripheral angiopathy without gangrene: Secondary | ICD-10-CM

## 2017-08-09 DIAGNOSIS — I480 Paroxysmal atrial fibrillation: Secondary | ICD-10-CM | POA: Diagnosis not present

## 2017-08-09 DIAGNOSIS — I2510 Atherosclerotic heart disease of native coronary artery without angina pectoris: Secondary | ICD-10-CM | POA: Diagnosis not present

## 2017-08-09 DIAGNOSIS — N183 Chronic kidney disease, stage 3 unspecified: Secondary | ICD-10-CM

## 2017-08-09 DIAGNOSIS — I5022 Chronic systolic (congestive) heart failure: Secondary | ICD-10-CM

## 2017-08-09 DIAGNOSIS — J449 Chronic obstructive pulmonary disease, unspecified: Secondary | ICD-10-CM | POA: Diagnosis not present

## 2017-08-09 DIAGNOSIS — R2681 Unsteadiness on feet: Secondary | ICD-10-CM | POA: Diagnosis not present

## 2017-08-09 DIAGNOSIS — E039 Hypothyroidism, unspecified: Secondary | ICD-10-CM | POA: Diagnosis not present

## 2017-08-09 DIAGNOSIS — M869 Osteomyelitis, unspecified: Secondary | ICD-10-CM | POA: Diagnosis not present

## 2017-08-09 DIAGNOSIS — I10 Essential (primary) hypertension: Secondary | ICD-10-CM

## 2017-08-09 MED ORDER — BISOPROLOL FUMARATE 5 MG PO TABS: 2.5000 mg | ORAL_TABLET | Freq: Every day | ORAL | 0 refills | Status: DC

## 2017-08-09 MED ORDER — SYMBICORT 160-4.5 MCG/ACT IN AERO: 2.0000 | INHALATION_SPRAY | Freq: Two times a day (BID) | RESPIRATORY_TRACT | 0 refills | Status: DC

## 2017-08-09 MED ORDER — SPIRONOLACTONE 25 MG PO TABS: 25.0000 mg | ORAL_TABLET | Freq: Every day | ORAL | 0 refills | Status: DC

## 2017-08-09 MED ORDER — CLOPIDOGREL BISULFATE 75 MG PO TABS: 75.0000 mg | ORAL_TABLET | Freq: Every day | ORAL | 0 refills | Status: DC

## 2017-08-09 MED ORDER — ATORVASTATIN CALCIUM 40 MG PO TABS: ORAL_TABLET | ORAL | 0 refills | Status: DC

## 2017-08-09 MED ORDER — AMLODIPINE BESYLATE 10 MG PO TABS: 10.0000 mg | ORAL_TABLET | Freq: Every day | ORAL | 0 refills | Status: DC

## 2017-08-09 MED ORDER — INSULIN GLARGINE 100 UNIT/ML SOLOSTAR PEN: 64.0000 [IU] | PEN_INJECTOR | Freq: Every day | SUBCUTANEOUS | 0 refills | Status: DC

## 2017-08-09 MED ORDER — APIXABAN 5 MG PO TABS: 5.0000 mg | ORAL_TABLET | Freq: Two times a day (BID) | ORAL | 0 refills | Status: DC

## 2017-08-09 MED ORDER — ALBUTEROL SULFATE HFA 108 (90 BASE) MCG/ACT IN AERS: 2.0000 | INHALATION_SPRAY | Freq: Four times a day (QID) | RESPIRATORY_TRACT | 0 refills | Status: DC | PRN

## 2017-08-09 MED ORDER — INSULIN ASPART 100 UNIT/ML ~~LOC~~ SOLN: 30.0000 [IU] | Freq: Three times a day (TID) | SUBCUTANEOUS | 0 refills | Status: DC

## 2017-08-09 MED ORDER — LEVOTHYROXINE SODIUM 50 MCG PO TABS: 50.0000 ug | ORAL_TABLET | Freq: Every day | ORAL | 0 refills | Status: DC

## 2017-08-09 MED ORDER — AMIODARONE HCL 100 MG PO TABS: 100.0000 mg | ORAL_TABLET | Freq: Every day | ORAL | 0 refills | Status: DC

## 2017-08-09 MED ORDER — TRAZODONE HCL 100 MG PO TABS: 100.0000 mg | ORAL_TABLET | Freq: Every day | ORAL | 0 refills | Status: DC

## 2017-08-09 MED ORDER — DULOXETINE HCL 60 MG PO CPEP: 60.0000 mg | ORAL_CAPSULE | Freq: Every day | ORAL | 0 refills | Status: DC

## 2017-08-09 MED ORDER — TORSEMIDE 20 MG PO TABS: 20.0000 mg | ORAL_TABLET | Freq: Two times a day (BID) | ORAL | 0 refills | Status: DC

## 2017-08-09 MED ORDER — METOLAZONE 2.5 MG PO TABS: 2.5000 mg | ORAL_TABLET | ORAL | 0 refills | Status: DC

## 2017-08-09 MED ORDER — LOSARTAN POTASSIUM 25 MG PO TABS: 25.0000 mg | ORAL_TABLET | Freq: Every day | ORAL | 0 refills | Status: DC

## 2017-08-09 NOTE — Progress Notes (Signed)
DATE:  08/09/2017   MRN:  [id number]  BIRTHDAY: 04/01/1961  Facility:  Nursing Home Location:  [facility] Room Number: 409-W  LEVEL OF CARE:  SNF (31)  Contact Information    Name Relation Home Work [place] Sister   [phone]   [place]   [phone]       Code Status History    Date Active Date Inactive Code Status Order ID Comments User Context   06/13/2017 11:10 AM 06/16/2017  6:33 PM DNR [id number]  Zada Finders, MD ED   04/06/2017  9:43 PM 04/14/2017  4:11 PM Full Code [id number]  Corey Harold, NP ED   02/04/2016  1:15 AM 02/08/2016  9:38 PM Full Code [id number]  Alphia Moh, MD Inpatient   01/12/2016  8:18 PM 01/18/2016  3:51 PM Full Code [id number]  Isaiah Serge, NP Inpatient   11/24/2015  2:32 AM 12/03/2015  9:38 PM Full Code [id number]  Terressa Koyanagi, MD ED   11/07/2015  2:31 PM 11/09/2015  9:52 PM Full Code [id number]  Almyra Deforest, [place] Inpatient   10/21/2015  2:55 AM 10/23/2015  6:00 PM Full Code [id number]  Alphia Moh, MD Inpatient   10/15/2015 11:13 AM 10/17/2015  4:31 PM Full Code [id number]  Eileen Stanford, PA-C ED   09/05/2015  1:00 AM 09/07/2015  5:38 PM Full Code [id number]  Etta Quill, DO ED   06/23/2015  5:11 AM 06/25/2015  3:46 PM Full Code [id number]  [name], MD ED   06/14/2015  8:26 AM 06/19/2015  4:47 PM Full Code [id number]  Theodis Blaze, MD Inpatient   04/29/2015 11:44 PM 05/03/2015  3:55 PM Full Code [id number]  [name], MD Inpatient   02/19/2015  4:35 PM 02/21/2015  1:11 PM Full Code [id number]  Ulyses Amor, PA-C Inpatient   01/15/2015  3:16 PM 01/16/2015  1:19 PM Full Code [id number]  [name], MD Inpatient   10/31/2014 12:53 PM 11/06/2014  3:27 PM Full Code [id number]  Burnell Blanks, MD Inpatient   10/29/2014  1:02 PM 10/31/2014  9:52 AM Full Code [id number]  Thayer Headings, MD Inpatient   09/07/2014  8:24 AM 09/09/2014  2:32 PM Full Code [id number]  Ivor Costa, MD Inpatient   06/16/2014  6:52 PM 06/18/2014  5:25 PM Full Code [id number]  Domenic Polite, MD Inpatient   02/01/2014  7:01 PM 02/05/2014  7:38 PM Full Code [id number]  Velvet Bathe, MD Inpatient    Questions for Most Recent Historical Code Status (Order [id number])    Question Answer Comment   In the event of cardiac or respiratory ARREST Do not call a "code blue"    In the event of cardiac or respiratory ARREST Do not perform Intubation, CPR, defibrillation or ACLS    In the event of cardiac or respiratory ARREST Use medication by any route, position, wound care, and other measures to relive pain and suffering. May use oxygen, suction and manual treatment of airway obstruction as needed for comfort.        Chief Complaint  Patient presents with  . Discharge Note    Discharge visit    HISTORY OF PRESENT ILLNESS:  This is a 12-YO female seen for a discharge visit.  She will discharge home on 08/11/17 with home health OT, PT, and Nursing for wound care.  She has a PMH of COPD, CKD stage 3, DM type 2 with peripheral vascular  complications, hypothyroidism, PAF, essential HTN, and chronic systolic CHF. She has been admitted to [place] on 06/16/17 from [facility] with osteomyelitis of right foot for which she had transmetatarsal amputation of right foot on 06/14/17. She is currently not on any opioid and pain is well-controlled with Acetaminophen.  She has been seen in her room today. She verbalized that she will be living with her sister when she gets home.   Patient was admitted to this facility for short-term rehabilitation after the patient's recent hospitalization.  Patient has completed SNF rehabilitation and therapy has cleared the patient for discharge.   PAST MEDICAL HISTORY:  Past Medical History:  Diagnosis Date  . Alcohol abuse   . Alcoholic cirrhosis ([place])   . Anemia   . Anxiety   . B12 deficiency   . CAD (coronary artery disease)    a. cath  11/10/2014 LM nl, LAD min irregs, D1 30 ost, D2 50d, LCX 51m, OM1 80 p/m (1.5 mm vessel), OM2 40m, RCA nondom 3m-->med rx.. Demand ischemia in the setting of rapid a-fib.  . Cardiomyopathy ([place])   . Carotid artery disease ([place])    a. 01/2015 Carotid Angio: RICA 938, LICA 18E. s/p L carotid endarterectomy 02/2015.  [name] Chronic combined systolic and diastolic CHF (congestive heart failure) ([place])    a. Last echo 12/205: EF 40-45%, inferoapical/posterior HK, not technically sufficient to allow eval of LV diastolic function, normal LA in size..  . Cocaine abuse   . COPD (chronic obstructive pulmonary disease) ([place])   . Critical lower limb ischemia   . Depression   . Diabetic peripheral neuropathy ([place])   . Elevated troponin    a. Chronic elevation.  [name] GERD (gastroesophageal reflux disease)   . Hyperlipemia   . Hypertension   . Hypokalemia   . Hypomagnesemia   . Hypothyroidism   . Marijuana abuse   . Narcotic abuse   . Noncompliance   . NSVT (nonsustained ventricular tachycardia) ([place])   . Obesity   . PAF (paroxysmal atrial fibrillation) ([place])   . Paroxysmal atrial tachycardia ([place])   . Peripheral arterial disease ([place])    a. 01/2015 Angio/PTA: LSFA 100 w/ recon @ adductor canal and 1 vessel runoff via AT, RSFA 99 (atherectomy/pta) - 1 vessel runoff via diff dzs peroneal.  . Poorly controlled type 2 diabetes mellitus ([place])   . Renal insufficiency    a. 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  [name] Symptomatic bradycardia    avoid AV blocking agent per EP  . Tobacco abuse      CURRENT MEDICATIONS: Reviewed  Patient's Medications  New Prescriptions   No medications on file  Previous Medications   ACETAMINOPHEN (TYLENOL) 325 MG TABLET    Take 650 mg by mouth. Take 650 mg q6h prn and 650 mg BID scheduled   ALBUTEROL (PROVENTIL HFA;VENTOLIN HFA) 108 (90 BASE) MCG/ACT INHALER    Inhale 2 puffs into the lungs every 6 (six) hours as needed for wheezing or shortness of breath.   AMINO ACIDS-PROTEIN HYDROLYS  (FEEDING SUPPLEMENT, PRO-STAT SUGAR FREE 64,) LIQD    Take 30 mLs by mouth 2 (two) times daily.   AMIODARONE (PACERONE) 100 MG TABLET    Take 1 tablet (100 mg total) by mouth daily.   AMLODIPINE (NORVASC) 10 MG TABLET    TAKE ONE TABLET BY MOUTH ONCE DAILY   APIXABAN (ELIQUIS) 5 MG TABS TABLET    Take 1 tablet (5 mg total) by mouth 2 (two) times daily.  ATORVASTATIN (LIPITOR) 40 MG TABLET    TAKE 1 TABLET BY MOUTH ONCE EVERY EVENING   BISOPROLOL (ZEBETA) 5 MG TABLET    TAKE ONE-HALF ( 2.5MG  TOTAL) TABLET BY MOUTH DAILY   CALCIUM CARBONATE (TUMS - DOSED IN MG ELEMENTAL CALCIUM) 500 MG CHEWABLE TABLET    Chew 1 tablet by mouth every 6 (six) hours as needed for indigestion or heartburn.   CHOLECALCIFEROL (VITAMIN D) 1000 UNITS TABLET    Take 1,000 Units by mouth daily.   CLOPIDOGREL (PLAVIX) 75 MG TABLET    Take 1 tablet (75 mg total) by mouth daily with breakfast.   CYANOCOBALAMIN 1000 MCG TABLET    Take 1 tablet (1,000 mcg total) by mouth daily.   DOCUSATE SODIUM (COLACE) 100 MG CAPSULE    Take 1 capsule (100 mg total) by mouth 2 (two) times daily.   DULOXETINE (CYMBALTA) 60 MG CAPSULE    Take 60 mg by mouth daily.   FOLIC ACID (FOLVITE) 1 MG TABLET    TAKE 1 TABLET BY MOUTH EVERY DAY   INSULIN ASPART (NOVOLOG) 100 UNIT/ML INJECTION    Inject 30 Units into the skin 3 (three) times daily before meals.   INSULIN GLARGINE (LANTUS SOLOSTAR) 100 UNIT/ML SOLOSTAR PEN    Inject 72 Units into the skin. Daily at 8PM    LEVOTHYROXINE (SYNTHROID, LEVOTHROID) 50 MCG TABLET    Take 1 tablet (50 mcg total) by mouth daily.   LOSARTAN (COZAAR) 25 MG TABLET    TAKE ONE TABLET BY MOUTH ONCE DAILY   METOLAZONE (ZAROXOLYN) 2.5 MG TABLET    TAKE 1 TABLE EVERY WEEK ON THURSDAY. MAY TAKE ONE ADDITIONAL TABLET AS DIRECTED BY HEART FAILURE CLINIC   MULTIPLE VITAMIN (MULTIVITAMIN) TABLET    Take 1 tablet by mouth daily.   POTASSIUM CHLORIDE SA (K-DUR,KLOR-CON) 20 MEQ TABLET    Take 1 tablet (20 mEq total) by mouth daily.  Take an additional 20 meq (1 tab) on Thursday with metolazone   SENNA-DOCUSATE (SENOKOT-S) 8.6-50 MG TABLET    Take 2 tablets by mouth 2 (two) times daily.   SPIRONOLACTONE (ALDACTONE) 25 MG TABLET    Take 1 tablet (25 mg total) by mouth daily.   SYMBICORT 160-4.5 MCG/ACT INHALER    Inhale 2 puffs into the lungs 2 (two) times daily.   TORSEMIDE (DEMADEX) 20 MG TABLET    Take 20 mg by mouth 2 (two) times daily.   TRAZODONE (DESYREL) 100 MG TABLET    Take 100 mg by mouth at bedtime.  Modified Medications   No medications on file  Discontinued Medications   ALBUTEROL (PROVENTIL) (2.5 MG/3ML) 0.083% NEBULIZER SOLUTION    Take 2.5 mg by nebulization every 8 (eight) hours. Take at 6AM, 2PM, 10PM   TORSEMIDE (DEMADEX) 20 MG TABLET    Take 2 tablets (40 mg total) by mouth 2 (two) times daily.     Allergies  Allergen Reactions  . Gabapentin Nausea And Vomiting    POSSIBLE SHAKING? TAKING MED CURRENTLY  . Lyrica [Pregabalin] Other (See Comments)    shaking     REVIEW OF SYSTEMS:  GENERAL: no change in appetite, no fatigue, no weight changes, no fever, chills or weakness MOUTH and THROAT: Denies oral discomfort   RESPIRATORY: no cough, SOB, DOE, wheezing, hemoptysis CARDIAC: no chest pain, edema or palpitations GI: no abdominal pain, diarrhea, constipation, heart burn, nausea or vomiting GU: Denies dysuria, frequency, hematuria, incontinence, or discharge PSYCHIATRIC: Denies feeling of depression or anxiety. No report of  hallucinations, insomnia, paranoia, or agitation   PHYSICAL EXAMINATION  GENERAL APPEARANCE: Well nourished. In no acute distress. Morbidly obese SKIN:  Left heel with small scab and Right foot (S/P Metatarsal amputation) has dressing and TED stockings MOUTH and THROAT: Lips are without lesions. Oral mucosa is moist and without lesions. RESPIRATORY: breathing is even & unlabored, BS CTAB CARDIAC: RRR, no murmur,no extra heart sounds, no edema GI: abdomen soft, normal BS,  no masses, no tenderness, no hepatomegaly, no splenomegaly EXTREMITIES:  Able to move X 4 extremities, right foot with orthopedic shoe PSYCHIATRIC: Alert and oriented X 3. Affect and behavior are appropriate   LABS/RADIOLOGY: Labs reviewed: Basic Metabolic Panel:  Recent Labs  06/13/17 0910 06/14/17 0231 06/15/17 0701 06/19/17  NA 134* 134* 136 140  K 4.7 4.3 4.5 4.1  CL 94* 102 103  --   CO2 26 26 26   --   GLUCOSE 332* 287* 206*  --   BUN 56* 35* 19 15  CREATININE 1.87* 1.35* 1.18* 1.1  CALCIUM 9.1 8.0* 8.1*  --    Liver Function Tests:  Recent Labs  04/28/17 1324 06/13/17 0910 06/14/17 0231  AST 23 42* 26  ALT 27 36 28  ALKPHOS 127* 137* 115  BILITOT 0.4 0.5 0.6  PROT 7.9 7.2 5.9*  ALBUMIN 3.1* 3.0* 2.3*    Recent Labs  03/15/17 1832  LIPASE 34   CBC:  Recent Labs  06/13/17 0910 06/14/17 0231 06/15/17 0701 06/19/17  WBC 23.2* 15.8* 11.4* 12.9  NEUTROABS 20.8*  --  8.7* 9  HGB 10.4* 9.2* 9.1* 10.5*  HCT 32.5* 29.9* 29.8* 33*  MCV 89.8 91.2 92.8  --   PLT 315 225 200 251   Cardiac Enzymes:  Recent Labs  04/06/17 2325  TROPONINI 0.06*   CBG:  Recent Labs  06/15/17 2236 06/16/17 0439 06/16/17 1148  GLUCAP 256* 197* 296*      ASSESSMENT/PLAN:  1. Unsteady gait - for Home health PT and OT, for therapeutic strengthening exercises, fall precautions  2. Osteomyelitis of right foot, unspecified type ([place]) - for Home health PT and OT, for therapeutic strengthening exercises, follow-up with orthopedics, Dr. Sharol Given, RLE WBAT   3. Chronic systolic CHF (congestive heart failure) ([name]) - no SOB - bisoprolol (ZEBETA) 5 MG tablet; Take 0.5 tablets (2.5 mg total)  - by mouth daily.  Dispense: 15 tablet; Refill: 0 - losartan (COZAAR) 25 MG tablet; Take 1 tablet (25 mg total) by mouth daily.  Dispense: 30 tablet; Refill: 0 - metolazone (ZAROXOLYN) 2.5 MG tablet; Take 1 tablet (2.5 mg total) by mouth once a week. Take on Thursday  Dispense: 4 tablet;  Refill: 0 - spironolactone (ALDACTONE) 25 MG tablet; Take 1 tablet (25 mg total) by mouth daily.  Dispense: 30 tablet; Refill: 0 - torsemide (DEMADEX) 20 MG tablet; Take 1 tablet (20 mg total) by mouth 2 (two) times daily.  Dispense: 60 tablet; Refill: 0  4. DM (diabetes mellitus) with peripheral vascular complication ([name]) - insulin aspart (NOVOLOG) 100 UNIT/ML injection; Inject 30 Units into the skin 3 (three) times daily before meals.  Dispense: 30 mL; Refill: 0 - Insulin Glargine (LANTUS SOLOSTAR) 100 UNIT/ML Solostar Pen; Inject 64 Units into the skin daily. Daily at 8PM  Dispense: 15 mL; Refill: 0 - losartan (COZAAR) 25 MG tablet; Take 1 tablet (25 mg total) by mouth daily.  Dispense: 30 tablet; Refill: 0 Lab Results  Component Value Date   HGBA1C 9.8 06/21/2017    5. PAF (paroxysmal  atrial fibrillation) ([name]) - rate-controlled - amiodarone (PACERONE) 100 MG tablet; Take 1 tablet (100 mg total) by mouth daily.  Dispense: 30 tablet; Refill: 0 - apixaban (ELIQUIS) 5 MG TABS tablet; Take 1 tablet (5 mg total) by mouth 2 (two) times daily.  Dispense: 60 tablet; Refill: 0 - bisoprolol (ZEBETA) 5 MG tablet; Take 0.5 tablets (2.5 mg total) by mouth daily.  Dispense: 15 tablet; Refill: 0  6. Recurrent major depressive disorder, in partial remission ([name]) - DULoxetine (CYMBALTA) 60 MG capsule; Take 1 capsule (60 mg total) by mouth daily.  Dispense: 30 capsule; Refill: 0 - traZODone (DESYREL) 100 MG tablet; Take 1 tablet (100 mg total) by mouth at bedtime.  Dispense: 30 tablet; Refill: 0  7. Essential hypertension -  well-controlled  amLODipine (NORVASC) 10 MG tablet; Take 1 tablet (10 mg total) by mouth daily.  Dispense: 30 tablet; Refill: 0 - bisoprolol (ZEBETA) 5 MG tablet; Take 0.5 tablets (2.5 mg total) by mouth daily.  Dispense: 15 tablet; Refill: 0 - losartan (COZAAR) 25 MG tablet; Take 1 tablet (25 mg total) by mouth daily.  Dispense: 30 tablet; Refill: 0  8. Coronary artery disease  involving native coronary artery of native heart without angina pectoris - no complaints of chest pains - atorvastatin (LIPITOR) 40 MG tablet; TAKE 1 TABLET BY MOUTH ONCE EVERY EVENING  Dispense: 30 tablet; Refill: 0 - clopidogrel (PLAVIX) 75 MG tablet; Take 1 tablet (75 mg total) by mouth daily with breakfast.  Dispense: 30 tablet; Refill: 0  9. Hypothyroidism, unspecified type - levothyroxine (SYNTHROID, LEVOTHROID) 50 MCG tablet; Take 1 tablet (50 mcg total) by mouth daily.  Dispense: 30 tablet; Refill: 0 Lab Results  Component Value Date   TSH 3.08 07/19/2017    10. COPD GOLD 0  - no SOB - albuterol (PROVENTIL HFA;VENTOLIN HFA) 108 (90 Base) MCG/ACT inhaler; Inhale 2 puffs into the lungs every 6 (six) hours as needed for wheezing or shortness of breath.  Dispense: 18 g; Refill: 0 - SYMBICORT 160-4.5 MCG/ACT inhaler; Inhale 2 puffs into the lungs 2 (two) times daily.  Dispense: 10.2 g; Refill: 0  11. CKD (chronic kidney disease), stage III - stable Lab Results  Component Value Date   CREATININE 1.1 06/19/2017       I have filled out patient's discharge paperwork and written prescriptions.  Patient will receive home health PT, OT,  and Nursing.  DME provided:  None  Total discharge time: Greater than 30 minutes  Discharge time involved coordination of the discharge process with social worker, nursing staff and therapy department. Medical justification for home health services/DME verified.     Tyese Finken C. Capitan - NP    [employer] [phone]

## 2017-08-09 NOTE — Progress Notes (Deleted)
DATE:  08/09/2017 MRN:  [id number]  BIRTHDAY: [birth year]  Facility:  Nursing Home Location:  [facility] Room Number: 992-E  LEVEL OF CARE:  SNF (32)  Contact Information    Name Relation Home Work [place] Sister   [phone]   Cala Bradford   [phone]       Code Status History    Date Active Date Inactive Code Status Order ID Comments User Context   06/13/2017 11:10 AM 06/16/2017  6:33 PM DNR [id number]  Zada Finders, MD ED   04/06/2017  9:43 PM 04/14/2017  4:11 PM Full Code [id number]  Corey Harold, NP ED   02/04/2016  1:15 AM 02/08/2016  9:38 PM Full Code [id number]  Alphia Moh, MD Inpatient   01/12/2016  8:18 PM 01/18/2016  3:51 PM Full Code [id number]  Isaiah Serge, NP Inpatient   11/24/2015  2:32 AM 12/03/2015  9:38 PM Full Code [id number]  Terressa Koyanagi, MD ED   11/07/2015  2:31 PM 11/09/2015  9:52 PM Full Code [id number]  Almyra Deforest, [place] Inpatient   10/21/2015  2:55 AM 10/23/2015  6:00 PM Full Code [id number]  Alphia Moh, MD Inpatient   10/15/2015 11:13 AM 10/17/2015  4:31 PM Full Code [id number]  Eileen Stanford, PA-C ED   09/05/2015  1:00 AM 09/07/2015  5:38 PM Full Code [id number]  Etta Quill, DO ED   06/23/2015  5:11 AM 06/25/2015  3:46 PM Full Code [id number]  [name], MD ED   06/14/2015  8:26 AM 06/19/2015  4:47 PM Full Code [id number]  Theodis Blaze, MD Inpatient   04/29/2015 11:44 PM 05/03/2015  3:55 PM Full Code [id number]  [name], MD Inpatient   02/19/2015  4:35 PM 02/21/2015  1:11 PM Full Code [id number]  Ulyses Amor, PA-C Inpatient   01/15/2015  3:16 PM 01/16/2015  1:19 PM Full Code [id number]  [name], MD Inpatient   10/31/2014 12:53 PM 11/06/2014  3:27 PM Full Code [id number]  Burnell Blanks, MD Inpatient   10/29/2014  1:02 PM 10/31/2014  9:52 AM Full Code [id number]  Thayer Headings, MD Inpatient   09/07/2014  8:24 AM 09/09/2014  2:32 PM Full Code [id number]  Ivor Costa,  MD Inpatient   06/16/2014  6:52 PM 06/18/2014  5:25 PM Full Code [id number]  Domenic Polite, MD Inpatient   02/01/2014  7:01 PM 02/05/2014  7:38 PM Full Code [id number]  Velvet Bathe, MD Inpatient    Questions for Most Recent Historical Code Status (Order [id number])    Question Answer Comment   In the event of cardiac or respiratory ARREST Do not call a "code blue"    In the event of cardiac or respiratory ARREST Do not perform Intubation, CPR, defibrillation or ACLS    In the event of cardiac or respiratory ARREST Use medication by any route, position, wound care, and other measures to relive pain and suffering. May use oxygen, suction and manual treatment of airway obstruction as needed for comfort.        Chief Complaint  Patient presents with  . Discharge Note    Discharge visit    HISTORY OF PRESENT ILLNESS:  This is a 32-YO female seen for a discharge visit.  She will discharge home on 08/11/17 with home health OT, PT, and nursing for wound care.  She has a PMH of COPD, CKD stage 3, DM type 2 with peripheral vascular complications, hypothyroidism,  PAF, essential HTN, and chronic systolic CHF.      PAST MEDICAL HISTORY:  Past Medical History:  Diagnosis Date  . Alcohol abuse   . Alcoholic cirrhosis ([place])   . Anemia   . Anxiety   . B12 deficiency   . CAD (coronary artery disease)    a. cath 11/10/2014 LM nl, LAD min irregs, D1 30 ost, D2 50d, LCX 65m, OM1 80 p/m (1.5 mm vessel), OM2 65m, RCA nondom 32m-->med rx.. Demand ischemia in the setting of rapid a-fib.  . Cardiomyopathy ([place])   . Carotid artery disease ([place])    a. 01/2015 Carotid Angio: RICA 009, LICA 23R. s/p L carotid endarterectomy 02/2015.  [name] Chronic combined systolic and diastolic CHF (congestive heart failure) ([place])    a. Last echo 12/205: EF 40-45%, inferoapical/posterior HK, not technically sufficient to allow eval of LV diastolic function, normal LA in size..  . Cocaine abuse   . COPD (chronic obstructive pulmonary  disease) ([place])   . Critical lower limb ischemia   . Depression   . Diabetic peripheral neuropathy ([place])   . Elevated troponin    a. Chronic elevation.  [name] GERD (gastroesophageal reflux disease)   . Hyperlipemia   . Hypertension   . Hypokalemia   . Hypomagnesemia   . Hypothyroidism   . Marijuana abuse   . Narcotic abuse   . Noncompliance   . NSVT (nonsustained ventricular tachycardia) ([place])   . Obesity   . PAF (paroxysmal atrial fibrillation) ([place])   . Paroxysmal atrial tachycardia ([place])   . Peripheral arterial disease ([place])    a. 01/2015 Angio/PTA: LSFA 100 w/ recon @ adductor canal and 1 vessel runoff via AT, RSFA 99 (atherectomy/pta) - 1 vessel runoff via diff dzs peroneal.  . Poorly controlled type 2 diabetes mellitus ([place])   . Renal insufficiency    a. 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  [name] Symptomatic bradycardia    avoid AV blocking agent per EP  . Tobacco abuse      CURRENT MEDICATIONS: Reviewed  Patient's Medications  New Prescriptions   No medications on file  Previous Medications   ACETAMINOPHEN (TYLENOL) 325 MG TABLET    Take 650 mg by mouth. Take 650 mg q6h prn and 650 mg BID scheduled   ALBUTEROL (PROVENTIL HFA;VENTOLIN HFA) 108 (90 BASE) MCG/ACT INHALER    Inhale 2 puffs into the lungs every 6 (six) hours as needed for wheezing or shortness of breath.   AMINO ACIDS-PROTEIN HYDROLYS (FEEDING SUPPLEMENT, PRO-STAT SUGAR FREE 64,) LIQD    Take 30 mLs by mouth 2 (two) times daily.   AMIODARONE (PACERONE) 100 MG TABLET    Take 1 tablet (100 mg total) by mouth daily.   AMLODIPINE (NORVASC) 10 MG TABLET    TAKE ONE TABLET BY MOUTH ONCE DAILY   APIXABAN (ELIQUIS) 5 MG TABS TABLET    Take 1 tablet (5 mg total) by mouth 2 (two) times daily.   ATORVASTATIN (LIPITOR) 40 MG TABLET    TAKE 1 TABLET BY MOUTH ONCE EVERY EVENING   BISOPROLOL (ZEBETA) 5 MG TABLET    TAKE ONE-HALF ( 2.5MG  TOTAL) TABLET BY MOUTH DAILY   CALCIUM CARBONATE (TUMS - DOSED IN MG ELEMENTAL CALCIUM) 500 MG CHEWABLE  TABLET    Chew 1 tablet by mouth every 6 (six) hours as needed for indigestion or heartburn.   CHOLECALCIFEROL (VITAMIN D) 1000 UNITS TABLET    Take 1,000 Units by mouth daily.   CLOPIDOGREL (PLAVIX) 75 MG TABLET  Take 1 tablet (75 mg total) by mouth daily with breakfast.   CYANOCOBALAMIN 1000 MCG TABLET    Take 1 tablet (1,000 mcg total) by mouth daily.   DOCUSATE SODIUM (COLACE) 100 MG CAPSULE    Take 1 capsule (100 mg total) by mouth 2 (two) times daily.   DULOXETINE (CYMBALTA) 60 MG CAPSULE    Take 60 mg by mouth daily.   FOLIC ACID (FOLVITE) 1 MG TABLET    TAKE 1 TABLET BY MOUTH EVERY DAY   INSULIN ASPART (NOVOLOG) 100 UNIT/ML INJECTION    Inject 30 Units into the skin 3 (three) times daily before meals.   INSULIN GLARGINE (LANTUS SOLOSTAR) 100 UNIT/ML SOLOSTAR PEN    Inject 72 Units into the skin. Daily at 8PM    LEVOTHYROXINE (SYNTHROID, LEVOTHROID) 50 MCG TABLET    Take 1 tablet (50 mcg total) by mouth daily.   LOSARTAN (COZAAR) 25 MG TABLET    TAKE ONE TABLET BY MOUTH ONCE DAILY   METOLAZONE (ZAROXOLYN) 2.5 MG TABLET    TAKE 1 TABLE EVERY WEEK ON THURSDAY. MAY TAKE ONE ADDITIONAL TABLET AS DIRECTED BY HEART FAILURE CLINIC   MULTIPLE VITAMIN (MULTIVITAMIN) TABLET    Take 1 tablet by mouth daily.   POTASSIUM CHLORIDE SA (K-DUR,KLOR-CON) 20 MEQ TABLET    Take 1 tablet (20 mEq total) by mouth daily. Take an additional 20 meq (1 tab) on Thursday with metolazone   SENNA-DOCUSATE (SENOKOT-S) 8.6-50 MG TABLET    Take 2 tablets by mouth 2 (two) times daily.   SPIRONOLACTONE (ALDACTONE) 25 MG TABLET    Take 1 tablet (25 mg total) by mouth daily.   SYMBICORT 160-4.5 MCG/ACT INHALER    Inhale 2 puffs into the lungs 2 (two) times daily.   TORSEMIDE (DEMADEX) 20 MG TABLET    Take 20 mg by mouth 2 (two) times daily.   TRAZODONE (DESYREL) 100 MG TABLET    Take 100 mg by mouth at bedtime.  Modified Medications   No medications on file  Discontinued Medications   ALBUTEROL (PROVENTIL) (2.5 MG/3ML)  0.083% NEBULIZER SOLUTION    Take 2.5 mg by nebulization every 8 (eight) hours. Take at 6AM, 2PM, 10PM   TORSEMIDE (DEMADEX) 20 MG TABLET    Take 2 tablets (40 mg total) by mouth 2 (two) times daily.     Allergies  Allergen Reactions  . Gabapentin Nausea And Vomiting    POSSIBLE SHAKING? TAKING MED CURRENTLY  . Lyrica [Pregabalin] Other (See Comments)    shaking     REVIEW OF SYSTEMS:  GENERAL: no change in appetite, no fatigue, no weight changes, no fever, chills or weakness SKIN: Denies rash, itching, wounds, ulcer sores, or nail abnormality EYES: Denies change in vision, dry eyes, eye pain, itching or discharge EARS: Denies change in hearing, ringing in ears, or earache NOSE: Denies nasal congestion or epistaxis MOUTH and THROAT: Denies oral discomfort, gingival pain or bleeding, pain from teeth or hoarseness   RESPIRATORY: no cough, SOB, DOE, wheezing, hemoptysis CARDIAC: no chest pain, edema or palpitations GI: no abdominal pain, diarrhea, constipation, heart burn, nausea or vomiting GU: Denies dysuria, frequency, hematuria, incontinence, or discharge MUSCULOSKELETAL: Denies joit pain, muscle pain, back pain, restricted movement, or unusual weakness CIRCULATION: Denies claudication, edema of legs, varicosities, or cold extremities NEUROLOGICAL: Denies dizziness, syncope, numbness, or headache PSYCHIATRIC: Denies feeling of depression or anxiety. No report of hallucinations, insomnia, paranoia, or agitation ENDOCRINE: Denies polyphagia, polyuria, polydipsia, heat or cold intolerance HEME/LYMPH: Denies excessive bruising,  petechia, enlarged lymph nodes, or bleeding problems IMMUNOLOGIC: Denies history of frequent infections, AIDS, or use of immunosuppressive agents    PHYSICAL EXAMINATION  GENERAL APPEARANCE: Well nourished. In no acute distress. Normal body habitus SKIN:  Skin is warm and dry. There are no suspicious lesions or rash HEAD: Normal in size and contour. No  evidence of trauma EYES: Lids open and close normally. No blepharitis, entropion or ectropion. PERRL. Conjunctivae are clear and sclerae are white. Lenses are without opacity EARS: Pinnae are normal. Patient hears normal voice tunes of the examiner MOUTH and THROAT: Lips are without lesions. Oral mucosa is moist and without lesions. Tongue is normal in shape, size, and color and without lesions NECK: supple, trachea midline, no neck masses, no thyroid tenderness, no thyromegaly LYMPHATICS: no LAN in the neck, no supraclavicular LAN RESPIRATORY: breathing is even & unlabored, BS CTAB CARDIAC: RRR, no murmur,no extra heart sounds, no edema GI: abdomen soft, normal BS, no masses, no tenderness, no hepatomegaly, no splenomegaly MUSCULOSKELETAL: No deformities. Movement at each extremity is full and painless. Strength is 5/5 at each extremity. Back is without kyphosis or scoliosis CIRCULATION: pedal pulses are 2+. There is no edema of the legs, ankles and feet NEUROLOGICAL: There is no tremor. Speech is clear PSYCHIATRIC: Alert and oriented X 3. Affect and behavior are appropriate  LABS/RADIOLOGY: Labs reviewed: Basic Metabolic Panel:  Recent Labs  06/13/17 0910 06/14/17 0231 06/15/17 0701 06/19/17  NA 134* 134* 136 140  K 4.7 4.3 4.5 4.1  CL 94* 102 103  --   CO2 26 26 26   --   GLUCOSE 332* 287* 206*  --   BUN 56* 35* 19 15  CREATININE 1.87* 1.35* 1.18* 1.1  CALCIUM 9.1 8.0* 8.1*  --    Liver Function Tests:  Recent Labs  04/28/17 1324 06/13/17 0910 06/14/17 0231  AST 23 42* 26  ALT 27 36 28  ALKPHOS 127* 137* 115  BILITOT 0.4 0.5 0.6  PROT 7.9 7.2 5.9*  ALBUMIN 3.1* 3.0* 2.3*    Recent Labs  03/15/17 1832  LIPASE 34   CBC:  Recent Labs  06/13/17 0910 06/14/17 0231 06/15/17 0701 06/19/17  WBC 23.2* 15.8* 11.4* 12.9  NEUTROABS 20.8*  --  8.7* 9  HGB 10.4* 9.2* 9.1* 10.5*  HCT 32.5* 29.9* 29.8* 33*  MCV 89.8 91.2 92.8  --   PLT 315 225 200 251   Cardiac  Enzymes:  Recent Labs  04/06/17 2325  TROPONINI 0.06*   CBG:  Recent Labs  06/15/17 2236 06/16/17 0439 06/16/17 1148  GLUCAP 256* 197* 296*      ASSESSMENT/PLAN:  DME:  None needed.      Monina C. Gypsum - NP    [employer] [phone]

## 2017-08-17 ENCOUNTER — Ambulatory Visit (INDEPENDENT_AMBULATORY_CARE_PROVIDER_SITE_OTHER): Payer: Medicare Other | Admitting: Orthopedic Surgery

## 2017-08-17 ENCOUNTER — Encounter (INDEPENDENT_AMBULATORY_CARE_PROVIDER_SITE_OTHER): Payer: Self-pay | Admitting: Orthopedic Surgery

## 2017-08-17 DIAGNOSIS — I739 Peripheral vascular disease, unspecified: Secondary | ICD-10-CM

## 2017-08-17 DIAGNOSIS — Z89431 Acquired absence of right foot: Secondary | ICD-10-CM

## 2017-08-17 DIAGNOSIS — E1151 Type 2 diabetes mellitus with diabetic peripheral angiopathy without gangrene: Secondary | ICD-10-CM

## 2017-08-17 NOTE — Progress Notes (Signed)
Post-Op Visit Note   Patient: [name]           Date of Birth: 03/10/1961           MRN: [id number] Visit Date: 08/17/2017 PCP: Sandi Mariscal, MD  Chief Complaint:  Chief Complaint  Patient presents with  . Right Foot - Follow-up    HPI:  HPI The patient is a 56 year old woman who is seen today status post right transmetatarsal amputation on June 14, 2017. She is pleased with her progress she continues to have some phantom pain right foot. Is taking Lyrica, states PCP is changing her dose. complains of pain to the left lower extremity she is concern for poor circulation she does have an appointment Dr. Gwenlyn Found tomorrow; plan for ABIs left.  States is walking around home in post op shoe. In wheelchair today for ambulation.  Ortho Exam On examination of right transmetatarsal amputation, is well healed. No erythema, drainage or sign of infection. Moderate swelling to foot.   Visit Diagnoses:  1. S/P transmetatarsal amputation of foot, right ([place])     Plan: continue compression garments to lower extremities. Has appointment and order for spacer and orthotic for right foot. Follow with PCP for management of chronic pain. Follow up in office as needed.   Follow-Up Instructions: Return if symptoms worsen or fail to improve.   Imaging: No results found.  Orders:  No orders of the defined types were placed in this encounter.  No orders of the defined types were placed in this encounter.    PMFS History: Patient Active Problem List   Diagnosis Date Noted  . Coagulation disorder ([place]) 08/09/2017  . Depression 07/21/2017  . At risk for adverse drug reaction 06/20/2017  . Peripheral neuropathy 06/20/2017  . Acute osteomyelitis of right foot ([place]) 06/13/2017  . S/P transmetatarsal amputation of foot, right ([place]) 06/05/2017  . 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 PAD (peripheral artery disease) ([place])     . Sepsis due to other etiology ([place]) 04/06/2017  . CKD (chronic kidney disease), stage III ([place]) 11/24/2016  . Suspected sleep apnea 11/24/2016  . Severe obesity (BMI >= 40) ([place]) 02/24/2016  . COPD GOLD 0  02/24/2016  . Chronic systolic CHF (congestive heart failure) ([place]) 02/12/2016  . Encounter for therapeutic drug monitoring 02/10/2016  . Symptomatic bradycardia 01/12/2016  . Essential hypertension 12/22/2015  . Wheeze   . 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 Tobacco abuse 10/23/2015  . Coronary artery disease involving native coronary artery of native heart   . DOE (dyspnea on exertion) 04/29/2015  . PAF (paroxysmal atrial fibrillation) ([place]) 01/16/2015  . Carotid arterial disease ([place]) 01/16/2015  . Claudication ([place]) 01/15/2015  . Demand ischemia ([place]) 10/29/2014  . Insomnia 02/03/2014  . S/P peripheral artery angioplasty - [place] atherectomy; R SFA 09/11/2013    Class: Acute  . Leg pain, bilateral 08/19/2013  . Hypothyroidism 07/31/2013  . Cellulitis 06/13/2013  . History of cocaine abuse 06/13/2013  . Long term current use of anticoagulant therapy 05/20/2013  . Alcohol abuse   . Narcotic abuse ([place])   . Marijuana abuse   . Alcoholic cirrhosis ([place])   . DM (diabetes mellitus), type 2 with peripheral vascular complications [facility])    Past Medical History:  Diagnosis Date  . Alcohol abuse   . Alcoholic cirrhosis ([place])   . Anemia   . Anxiety   . B12 deficiency   .  CAD (coronary artery disease)    a. cath 11/10/2014 LM nl, LAD min irregs, D1 30 ost, D2 50d, LCX 23m, OM1 80 p/m (1.5 mm vessel), OM2 70m, RCA nondom 73m-->med rx.. Demand ischemia in the setting of rapid a-fib.  . Cardiomyopathy ([place])   . Carotid artery disease ([place])    a. 01/2015 Carotid Angio: RICA 761, LICA 60V. s/p L carotid endarterectomy 02/2015.  [name] Chronic combined systolic and diastolic CHF (congestive heart failure) ([place])    a. Last echo 12/205: EF 40-45%, inferoapical/posterior HK, not  technically sufficient to allow eval of LV diastolic function, normal LA in size..  . Cocaine abuse ([place])   . COPD (chronic obstructive pulmonary disease) ([place])   . Critical lower limb ischemia   . Depression   . Diabetic peripheral neuropathy ([place])   . Elevated troponin    a. Chronic elevation.  [name] GERD (gastroesophageal reflux disease)   . Hyperlipemia   . Hypertension   . Hypokalemia   . Hypomagnesemia   . Hypothyroidism   . Marijuana abuse   . Narcotic abuse ([place])   . Noncompliance   . NSVT (nonsustained ventricular tachycardia) ([place])   . Obesity   . PAF (paroxysmal atrial fibrillation) ([place])   . Paroxysmal atrial tachycardia ([place])   . Peripheral arterial disease ([place])    a. 01/2015 Angio/PTA: LSFA 100 w/ recon @ adductor canal and 1 vessel runoff via AT, RSFA 99 (atherectomy/pta) - 1 vessel runoff via diff dzs peroneal.  . Poorly controlled type 2 diabetes mellitus ([place])   . Renal insufficiency    a. 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  [name] Symptomatic bradycardia    avoid AV blocking agent per EP  . Tobacco abuse     Family History  Problem Relation Age of Onset  . Hypertension Mother   . Diabetes Mother   . Cancer Mother        breast, ovarian, colon  . Clotting disorder Mother   . Heart disease Mother   . Heart attack Mother   . Breast cancer Mother   . Hypertension Father   . Heart disease Father   . Emphysema Sister        smoked    Past Surgical History:  Procedure Laterality Date  . AMPUTATION Right 06/14/2017   Procedure: Right foot transmetatarsal amputation;  Surgeon: Newt Minion, MD;  Location: [facility];  Service: Orthopedics;  Laterality: Right;  . AMPUTATION TOE Right 04/28/2017   Procedure: AMPUTATION OF RIGHT SECOND RAY;  Surgeon: Newt Minion, MD;  Location: [facility];  Service: Orthopedics;  Laterality: Right;  . BALLOON ANGIOPLASTY, ARTERY Right 01/15/2015   SFA/notes 01/15/2015  . CARDIAC CATHETERIZATION    . CARDIAC CATHETERIZATION N/A 01/12/2016   Procedure:  Temporary Wire;  Surgeon: Minus Breeding, MD;  Location: [facility] CV LAB;  Service: Cardiovascular;  Laterality: N/A;  . CARDIOVERSION  ~ 02/2013   "twice"   . CAROTID ANGIOGRAM N/A 01/15/2015   Procedure: CAROTID ANGIOGRAM;  Surgeon: [name], MD;  Location: [facility] CATH LAB;  Service: Cardiovascular;  Laterality: N/A;  . DILATION AND CURETTAGE OF UTERUS  1988  . ENDARTERECTOMY Left 02/19/2015   Procedure: LEFT CAROTID ENDARTERECTOMY ;  Surgeon: Serafina Mitchell, MD;  Location: [facility];  Service: Vascular;  Laterality: Left;  . LEFT HEART CATHETERIZATION WITH CORONARY ANGIOGRAM N/A 10/31/2014   Procedure: LEFT HEART CATHETERIZATION WITH CORONARY ANGIOGRAM;  Surgeon: Burnell Blanks, MD;  Location: [facility] CATH LAB;  Service: Cardiovascular;  Laterality:  N/A;  . LOWER EXTREMITY ANGIOGRAM N/A 09/10/2013   Procedure: LOWER EXTREMITY ANGIOGRAM;  Surgeon: [name], MD;  [facility];  Service: Cardiovascular;  Laterality: N/A;  . LOWER EXTREMITY ANGIOGRAM N/A 01/15/2015   Procedure: LOWER EXTREMITY ANGIOGRAM;  Surgeon: [name], MD;  Location: [facility] CATH LAB;  Service: Cardiovascular;  Laterality: N/A;  . LOWER EXTREMITY ANGIOGRAPHY N/A 04/13/2017   Procedure: Lower Extremity Angiography;  Surgeon: [name], MD;  Location: [facility] CV LAB;  Service: Cardiovascular;  Laterality: N/A;  . PERIPHERAL ATHRECTOMY Right 01/15/2015   SFA/notes 01/15/2015  . PERIPHERAL VASCULAR INTERVENTION  04/13/2017   Procedure: Peripheral Vascular Intervention;  Surgeon: [name], MD;  Location: [facility] CV LAB;  Service: Cardiovascular;;   Social History   Occupational History  . disabled    Social History Main Topics  . Smoking status: Former Smoker    Packs/day: 0.00    Years: 40.00    Types: E-cigarettes    Quit date: 04/05/2017  . Smokeless tobacco: Former Systems developer    Types: Snuff  . Alcohol use No     Comment: former alcohol abuse  . Drug use: Yes    Types: "Crack"  cocaine, Marijuana, Oxycodone     Comment: 04/29/2015 "last drug use was ~ 09/08/2013"  . Sexual activity: No

## 2017-08-18 ENCOUNTER — Encounter: Payer: Self-pay | Admitting: Cardiovascular Disease

## 2017-08-18 ENCOUNTER — Ambulatory Visit (INDEPENDENT_AMBULATORY_CARE_PROVIDER_SITE_OTHER): Payer: Medicare Other | Admitting: Cardiovascular Disease

## 2017-08-18 VITALS — BP 115/63 | HR 84 | Ht 64.0 in | Wt 257.8 lb

## 2017-08-18 DIAGNOSIS — I480 Paroxysmal atrial fibrillation: Secondary | ICD-10-CM | POA: Diagnosis not present

## 2017-08-18 DIAGNOSIS — Z9862 Peripheral vascular angioplasty status: Secondary | ICD-10-CM

## 2017-08-18 DIAGNOSIS — I6523 Occlusion and stenosis of bilateral carotid arteries: Secondary | ICD-10-CM | POA: Diagnosis not present

## 2017-08-18 DIAGNOSIS — Z720 Tobacco use: Secondary | ICD-10-CM | POA: Diagnosis not present

## 2017-08-18 DIAGNOSIS — I2510 Atherosclerotic heart disease of native coronary artery without angina pectoris: Secondary | ICD-10-CM | POA: Diagnosis not present

## 2017-08-18 DIAGNOSIS — I10 Essential (primary) hypertension: Secondary | ICD-10-CM | POA: Diagnosis not present

## 2017-08-18 DIAGNOSIS — I5022 Chronic systolic (congestive) heart failure: Secondary | ICD-10-CM

## 2017-08-18 NOTE — Assessment & Plan Note (Signed)
History of carotid artery disease with angiographically documented occluded right internal carotid artery and 95% proximal left ICA stenosis. She also underwent uncomplicated left carotid endarterectomy by Dr. Trula Slade 02/19/15. Her last carotid Doppler performed 04/06/16 revealed an occluded right internal carotid artery with a widely patent left endarterectomy site. Will Will repeat carotid Doppler studies.

## 2017-08-18 NOTE — Assessment & Plan Note (Signed)
Discontinue tobacco abuse °

## 2017-08-18 NOTE — Progress Notes (Signed)
08/18/2017 [name]   03-Jun-1961  [id number]  Primary Physician Sandi Mariscal, MD Primary Cardiologist: [name] MD [name], [place]  HPI:  [name] is a 56 y.o. female moderately overweight single Caucasian female mother of one child who lives with her sister who accompanies her today. She was referred by Dr. Franki Monte at [facility] for peripheral vascular evaluation because of critical limb ischemia. I last saw her in the office 05/16/17. She sees Dr. Jenkins Rouge at [facility] for cardiomyopathy and paroxysmal atrial fibrillation. Her cardiac risk factors include a 20-pack-year history of tobacco abuse (she has stopped smoking), treated diabetes, and hypertension as well as family history of heart disease. She's never had a heart attack or stroke. She does have COPD. She had paroxysmal atrial fibrillation in the past and has undergone cardioversion in [place].. The lower extremity arterial Doppler studies performed in our office suggested critical limb ischemia with an occluded SFA and popliteal arteries bilaterally with tibial vessel disease as well. She complains of lifestyle limiting claudication. I performed lower extremity angiography on 09/10/13 revealing occluded SFAs bilaterally with chronic occluded tibial vessels as well. I performed TurboHawk directional atherectomy of her entire right SFA with an excellent angiographic result removing a copious amount of atherosclerotic plaque. The ulcer on her right heel ultimately healed. She does continue to smoke one pack per day. She was recently admitted to [facility] because of atrial fibrillation with rapid ventricular response. She underwent cardiac catheterization on 10/31/14 revealing an ejection fraction of 30-35% with disease that was ultimately treated medically. Her carotid Doppler showed high-grade bilateral ICA disease and lower extremity Dopplers revealed restenosis within the right SFA with  a ABI of 0.34. She is symptomatic on that side. I performed angiography on her 01/15/15 revealing an occluded right internal carotid artery, 95% proximal left internal carotid artery stenosis, long 99% stenosis within the right SFA and an occluded left SFA. I performed [facility] 1 directional atherectomy and drug-eluting balloon angioplasty of her right SFA with an excellent Angiographic and clinical result. She underwent left carotid endarterectomy by Dr. Trula Slade on 02/19/15 which was uncomplicated. Since I saw her 6 months ago she's been remaining clinically stable. She denies chest pain or shortness of breath. She continues to have claudication.Lower extremity Doppler studies performed 01/23/15 revealed ABIs in the 0.6 range bilaterally with a patent right SFA. She continues to complain of claudication however.recent Dopplers performed 12/22/15 revealed a right TBI 0. 17. She was admitted to the hospital with altered mental status and sepsis 04/06/17 for one week. 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 I suspect performed angiography of her 04/13/17 revealing an occluded right SFA was 0 vessel runoff. Atherectomy I's are entire right SFA and she subsequently underwent right second ray resection by Dr. Sharol Given to on 04/28/17.She has stopped smoking since that hospitalization. She also underwent a right transmetatarsal amputation by Dr. Sharol Given which has subsequently healed. Recent lower extremity Dopplers performed 05/29/17 revealed a right ABI 0.65 with a patent right SFA and a left ABI 0.31.   Current Meds  Medication Sig  . acetaminophen (TYLENOL) 325 MG tablet Take 650 mg by mouth. Take 650 mg q6h prn and 650 mg BID scheduled  . albuterol (PROVENTIL HFA;VENTOLIN HFA) 108 (90 Base) MCG/ACT inhaler Inhale 2 puffs into the lungs every 6 (six) hours as needed for wheezing or shortness of breath.  . Amino Acids-Protein Hydrolys (  FEEDING SUPPLEMENT, PRO-STAT SUGAR FREE 64,) LIQD Take 30 mLs by mouth 2 (two)  times daily.  [name] amiodarone (PACERONE) 100 MG tablet Take 1 tablet (100 mg total) by mouth daily.  [name] amLODipine (NORVASC) 10 MG tablet Take 1 tablet (10 mg total) by mouth daily.  [name] apixaban (ELIQUIS) 5 MG TABS tablet Take 1 tablet (5 mg total) by mouth 2 (two) times daily.  [name] atorvastatin (LIPITOR) 40 MG tablet TAKE 1 TABLET BY MOUTH ONCE EVERY EVENING  . bisoprolol (ZEBETA) 5 MG tablet Take 0.5 tablets (2.5 mg total) by mouth daily.  . calcium carbonate (TUMS - DOSED IN MG ELEMENTAL CALCIUM) 500 MG chewable tablet Chew 1 tablet by mouth every 6 (six) hours as needed for indigestion or heartburn.  . cholecalciferol (VITAMIN D) 1000 units tablet Take 1,000 Units by mouth daily.  . clopidogrel (PLAVIX) 75 MG tablet Take 1 tablet (75 mg total) by mouth daily with breakfast.  . cyanocobalamin 1000 MCG tablet Take 1 tablet (1,000 mcg total) by mouth daily.  [name] docusate sodium (COLACE) 100 MG capsule Take 1 capsule (100 mg total) by mouth 2 (two) times daily.  . DULoxetine (CYMBALTA) 60 MG capsule Take 1 capsule (60 mg total) by mouth daily.  . folic acid (FOLVITE) 1 MG tablet TAKE 1 TABLET BY MOUTH EVERY DAY  . insulin aspart (NOVOLOG) 100 UNIT/ML injection Inject 30 Units into the skin 3 (three) times daily before meals.  . Insulin Glargine (LANTUS SOLOSTAR) 100 UNIT/ML Solostar Pen Inject 64 Units into the skin daily. Daily at 8PM  . levothyroxine (SYNTHROID, LEVOTHROID) 50 MCG tablet Take 1 tablet (50 mcg total) by mouth daily.  [name] losartan (COZAAR) 25 MG tablet Take 1 tablet (25 mg total) by mouth daily.  . metolazone (ZAROXOLYN) 2.5 MG tablet Take 1 tablet (2.5 mg total) by mouth once a week. Take on Thursday  . Multiple Vitamin (MULTIVITAMIN) tablet Take 1 tablet by mouth daily.  . potassium chloride SA (K-DUR,KLOR-CON) 20 MEQ tablet Take 1 tablet (20 mEq total) by mouth daily. Take an additional 20 meq (1 tab) on Thursday with metolazone  . senna-docusate (SENOKOT-S) 8.6-50 MG tablet Take 2  tablets by mouth 2 (two) times daily.  [name] spironolactone (ALDACTONE) 25 MG tablet Take 1 tablet (25 mg total) by mouth daily.  . SYMBICORT 160-4.5 MCG/ACT inhaler Inhale 2 puffs into the lungs 2 (two) times daily.  [name] torsemide (DEMADEX) 20 MG tablet Take 1 tablet (20 mg total) by mouth 2 (two) times daily.  . traZODone (DESYREL) 100 MG tablet Take 1 tablet (100 mg total) by mouth at bedtime.     Allergies  Allergen Reactions  . Gabapentin Nausea And Vomiting    POSSIBLE SHAKING? TAKING MED CURRENTLY  . Lyrica [Pregabalin] Other (See Comments)    shaking    Social History   Social History  . Marital status: Divorced    Spouse name: N/A  . Number of children: N/A  . Years of education: N/A   Occupational History  . disabled    Social History Main Topics  . Smoking status: Former Smoker    Packs/day: 0.00    Years: 40.00    Types: E-cigarettes    Quit date: 04/05/2017  . Smokeless tobacco: Former Systems developer    Types: Snuff  . Alcohol use No     Comment: former alcohol abuse  . Drug use: Yes    Types: "Crack" cocaine, Marijuana, Oxycodone     Comment: 04/29/2015 "last drug use  was ~ 09/08/2013"  . Sexual activity: No   Other Topics Concern  . Not on file   Social History Narrative   Lives in [place], in [place] with sister.  They are looking to move but don't have a place to go yet.       Review of Systems: General: negative for chills, fever, night sweats or weight changes.  Cardiovascular: negative for chest pain, dyspnea on exertion, edema, orthopnea, palpitations, paroxysmal nocturnal dyspnea or shortness of breath Dermatological: negative for rash Respiratory: negative for cough or wheezing Urologic: negative for hematuria Abdominal: negative for nausea, vomiting, diarrhea, bright red blood per rectum, melena, or hematemesis Neurologic: negative for visual changes, syncope, or dizziness All other systems reviewed and are otherwise negative except as noted above.    Blood  pressure 115/63, pulse 84, height 5\' 4"  (1.626 m), weight 257 lb 12.8 oz (116.9 kg), last menstrual period 11/27/2012.  General appearance: alert and no distress Neck: no adenopathy, no carotid bruit, no JVD, supple, symmetrical, trachea midline and thyroid not enlarged, symmetric, no tenderness/mass/nodules Lungs: clear to auscultation bilaterally Heart: regular rate and rhythm, S1, S2 normal, no murmur, click, rub or gallop Extremities: extremities normal, atraumatic, no cyanosis or edema Pulses: 2+ and symmetric Absent pedal pulses bilaterally Skin: Skin color, texture, turgor normal. No rashes or lesions Neurologic: Alert and oriented X 3, normal strength and tone. Normal symmetric reflexes. Normal coordination and gait  EKG sinus rhythm at 84 without ST or T-wave changes. I personally reviewed this EKG.  ASSESSMENT AND PLAN:   PAF (paroxysmal atrial fibrillation) ([name]) History of paroxysmal fibrillation maintaining sinus rhythm on amiodarone and Eliquis .  Carotid arterial disease ([name]) History of carotid artery disease with angiographically documented occluded right internal carotid artery and 95% proximal left ICA stenosis. She also underwent uncomplicated left carotid endarterectomy by Dr. Trula Slade 02/19/15. Her last carotid Doppler performed 04/06/16 revealed an occluded right internal carotid artery with a widely patent left endarterectomy site. Will Will repeat carotid Doppler studies.  Tobacco abuse Discontinue tobacco abuse  Essential hypertension History of essential hypertension blood pressure today 115/63. She is on amlodipine, Zebeta and losartan. Continue current meds at current dosing.  Chronic systolic CHF (congestive heart failure) ([name]) History of chronic systolic heart failure with ejection fraction of 40-45% by 2-D echo performed 11/17/16. She is on oral diuretics, beta blocker and ARB drugs.  S/P peripheral artery angioplasty - TurboHawk atherectomy; R SFA History of  critical limb ischemia with multiple interventions on her right SFA most recently 04/06/17 at which time I recognize. Prior occluded right SFA CTO in the setting of critical limb ischemia. She has 0 vessel runoff below the knee. She sexually had a right transmetatarsal" by Dr. due to which has healed. Dopplers performed 05/29/17 revealed a right ABI 0.65 with a patent right SFA. She does complain of some pain in her left leg but there is no evidence of critical limb ischemia.  Coronary artery disease History of CAD with cardiac catheterization performed 10/31/14 revealing an EF of 30-35% with diffuse CAD which was ultimately treated medically. She denies chest pain.      [name] MD FACP,FACC,FAHA, [facility] 08/18/2017 3:03 PM

## 2017-08-18 NOTE — Patient Instructions (Addendum)
Medication Instructions: Your physician recommends that you continue on your current medications as directed. Please refer to the Current Medication list given to you today.   Testing/Procedures: Your physician has requested that you have a lower extremity arterial duplex. During this test, ultrasound is used to evaluate arterial blood flow in the legs. Allow one hour for this exam. There are no restrictions or special instructions.  Your physician has requested that you have an ankle brachial index (ABI). During this test an ultrasound and blood pressure cuff are used to evaluate the arteries that supply the arms and legs with blood. Allow thirty minutes for this exam. There are no restrictions or special instructions. --Please schedule these for January 2019.  Your physician has requested that you have a carotid duplex. This test is an ultrasound of the carotid arteries in your neck. It looks at blood flow through these arteries that supply the brain with blood. Allow one hour for this exam. There are no restrictions or special instructions.--1-2 weeks  Follow-Up: We request that you follow-up in: 3 months with an extender and in 6 months with Dr Andria Rhein will receive a reminder letter in the mail two months in advance. If you don't receive a letter, please call our office to schedule the follow-up appointment.  If you need a refill on your cardiac medications before your next appointment, please call your pharmacy.

## 2017-08-18 NOTE — Assessment & Plan Note (Signed)
History of CAD with cardiac catheterization performed 10/31/14 revealing an EF of 30-35% with diffuse CAD which was ultimately treated medically. She denies chest pain.

## 2017-08-18 NOTE — Assessment & Plan Note (Signed)
History of essential hypertension blood pressure today 115/63. She is on amlodipine, Zebeta and losartan. Continue current meds at current dosing.

## 2017-08-18 NOTE — Assessment & Plan Note (Signed)
History of critical limb ischemia with multiple interventions on her right SFA most recently 04/06/17 at which time I recognize. Prior occluded right SFA CTO in the setting of critical limb ischemia. She has 0 vessel runoff below the knee. She sexually had a right transmetatarsal" by Dr. due to which has healed. Dopplers performed 05/29/17 revealed a right ABI 0.65 with a patent right SFA. She does complain of some pain in her left leg but there is no evidence of critical limb ischemia.

## 2017-08-18 NOTE — Assessment & Plan Note (Signed)
History of chronic systolic heart failure with ejection fraction of 40-45% by 2-D echo performed 11/17/16. She is on oral diuretics, beta blocker and ARB drugs.

## 2017-08-18 NOTE — Assessment & Plan Note (Signed)
History of paroxysmal fibrillation maintaining sinus rhythm on amiodarone and Eliquis .

## 2017-09-01 ENCOUNTER — Inpatient Hospital Stay (HOSPITAL_COMMUNITY): Admission: RE | Admit: 2017-09-01 | Payer: Medicare Other | Source: Ambulatory Visit

## 2017-09-27 ENCOUNTER — Encounter (HOSPITAL_COMMUNITY): Payer: Self-pay

## 2017-10-26 ENCOUNTER — Other Ambulatory Visit (HOSPITAL_COMMUNITY): Payer: Self-pay | Admitting: Adult Health

## 2017-10-26 DIAGNOSIS — J449 Chronic obstructive pulmonary disease, unspecified: Secondary | ICD-10-CM

## 2017-11-01 IMAGING — MG DIGITAL SCREENING BILATERAL MAMMOGRAM WITH CAD
4 series · 4 of 4 positions shown · non-contrast
Comparison: Previous exam(s).

CLINICAL DATA: Screening.

EXAM:
DIGITAL SCREENING BILATERAL MAMMOGRAM WITH CAD

[L MLO]
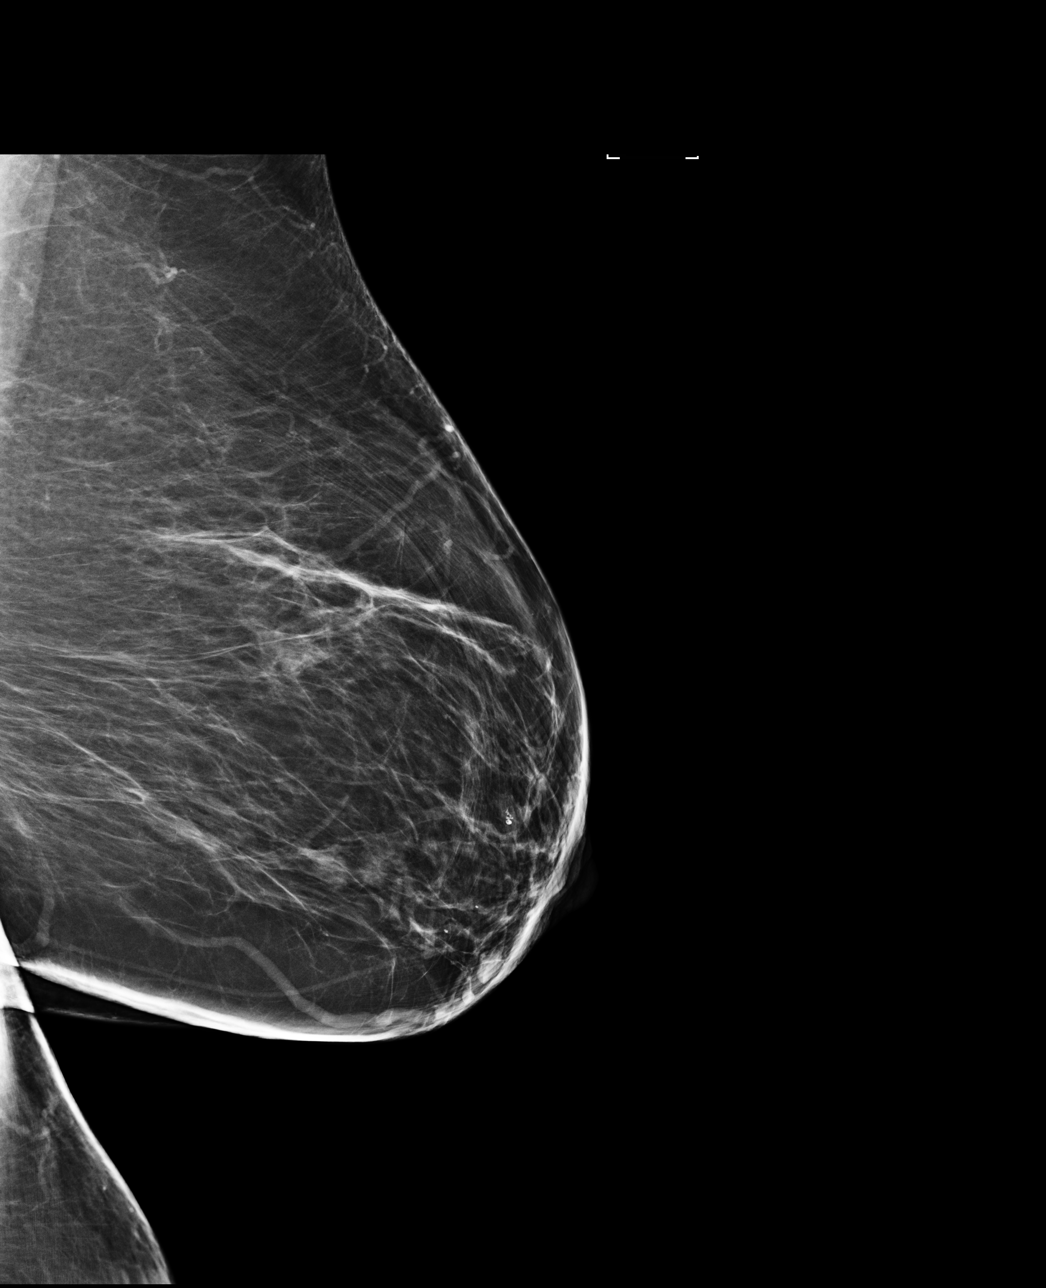

[R CC]
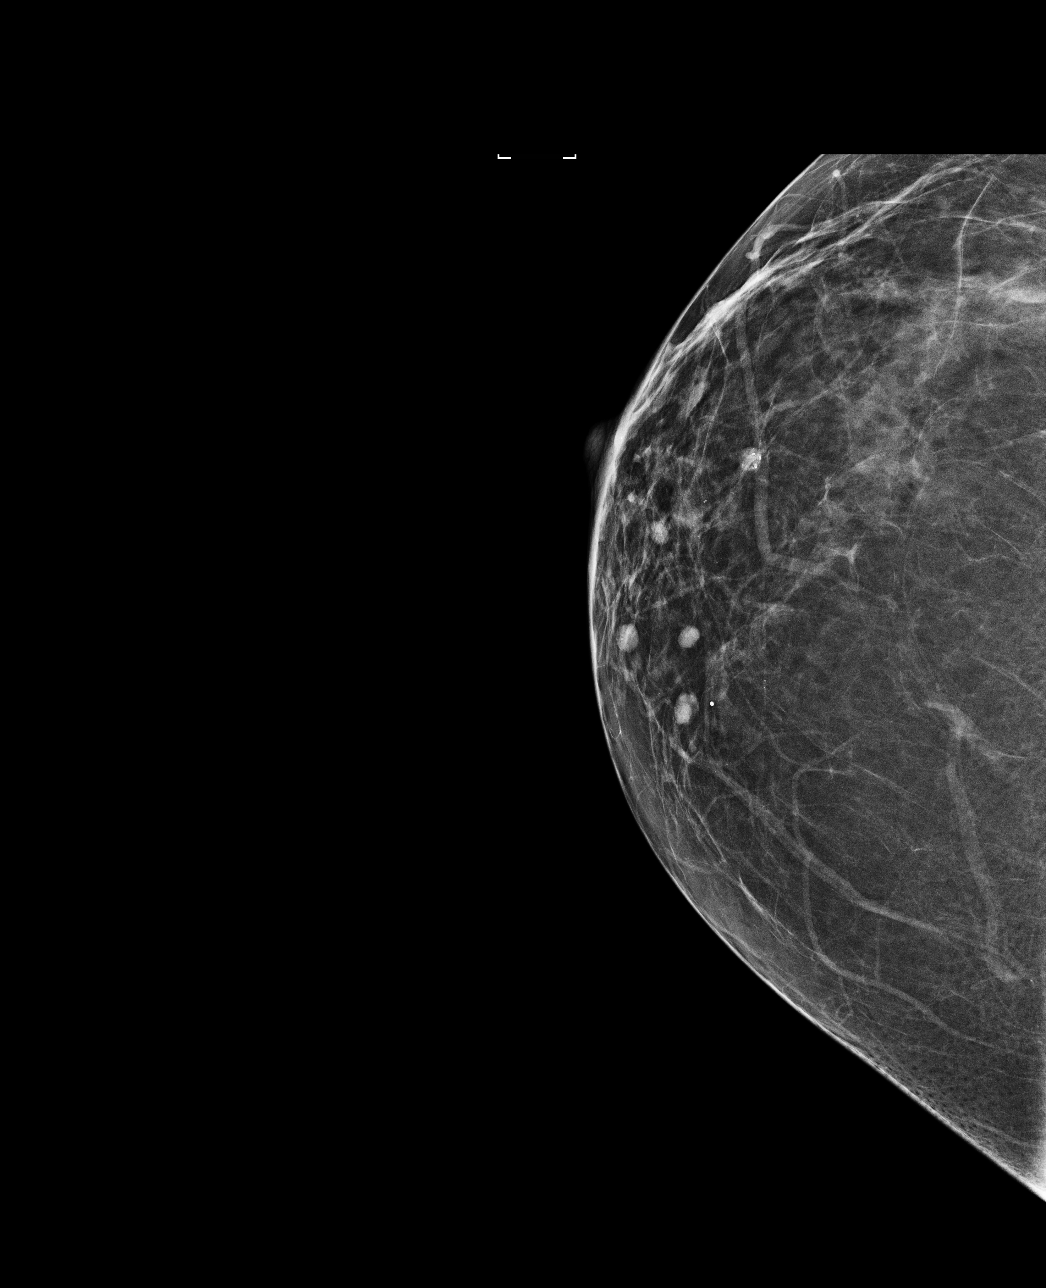

[L CC]
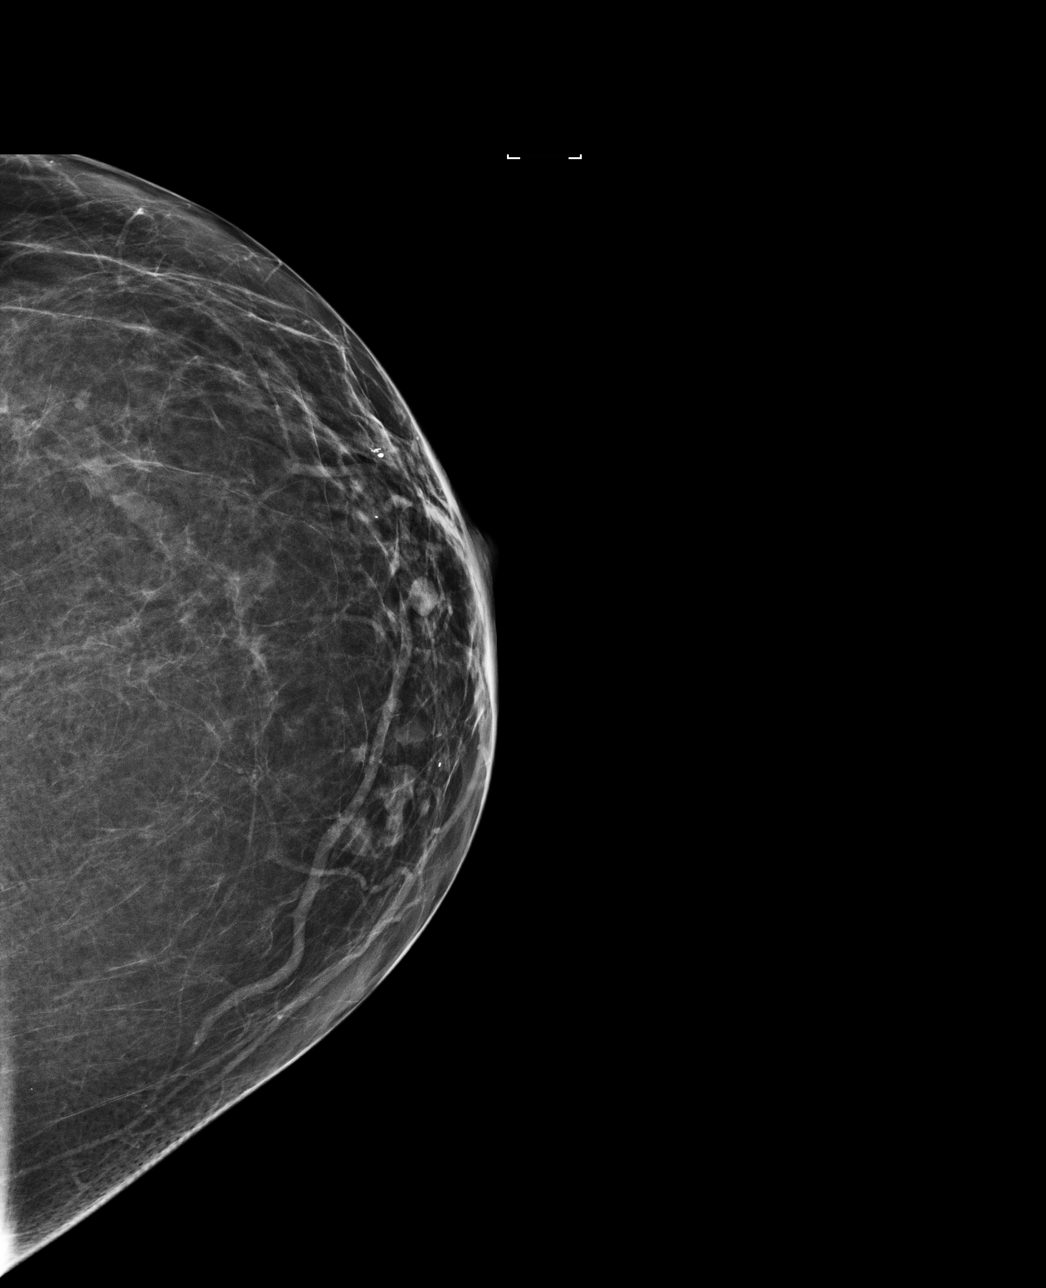

[R MLO]
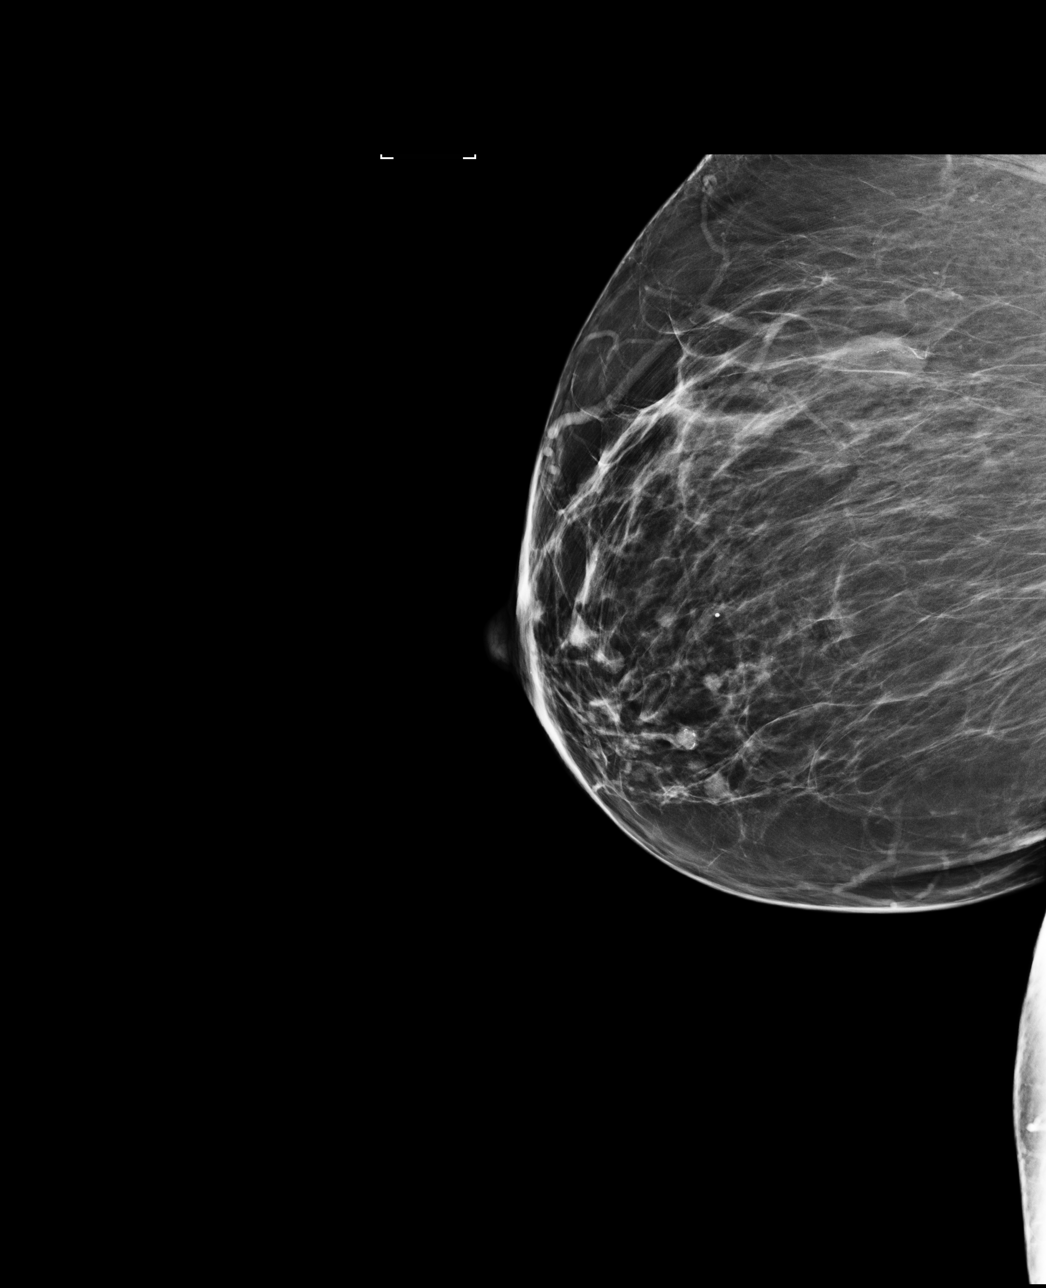

[4 of 4 positions shown; findings below may reference images not displayed]

ACR Breast Density Category b: There are scattered areas of
fibroglandular density.
FINDINGS: There are no findings suspicious for malignancy. Images were
processed with CAD.
IMPRESSION: No mammographic evidence of malignancy. A result letter of this
screening mammogram will be mailed directly to the patient.

RECOMMENDATION:
Screening mammogram in one year. (Code:AS-G-LCT)

BI-RADS CATEGORY  1: Negative.

## 2017-11-21 ENCOUNTER — Ambulatory Visit (HOSPITAL_COMMUNITY)
Admission: RE | Admit: 2017-11-21 | Payer: Medicare Other | Source: Ambulatory Visit | Attending: Cardiovascular Disease | Admitting: Cardiovascular Disease

## 2017-11-22 ENCOUNTER — Ambulatory Visit (HOSPITAL_COMMUNITY)
Admission: RE | Admit: 2017-11-22 | Discharge: 2017-11-22 | Disposition: A | Discharge status: Home / Self Care | Payer: Medicare HMO | Source: Ambulatory Visit | Attending: Cardiovascular Disease | Admitting: Cardiovascular Disease

## 2017-11-22 DIAGNOSIS — I771 Stricture of artery: Secondary | ICD-10-CM | POA: Diagnosis not present

## 2017-11-22 DIAGNOSIS — I739 Peripheral vascular disease, unspecified: Secondary | ICD-10-CM | POA: Insufficient documentation

## 2017-11-22 DIAGNOSIS — Z89421 Acquired absence of other right toe(s): Secondary | ICD-10-CM | POA: Insufficient documentation

## 2017-11-22 DIAGNOSIS — R9439 Abnormal result of other cardiovascular function study: Secondary | ICD-10-CM | POA: Insufficient documentation

## 2017-11-28 IMAGING — CR DG FOOT COMPLETE 3+V*L*
3 series · 3 of 3 positions shown · non-contrast
Comparison: None.

CLINICAL DATA: Cellulitis for 1 month

EXAM:
LEFT FOOT - COMPLETE 3+ VIEW

[x foot ap left]
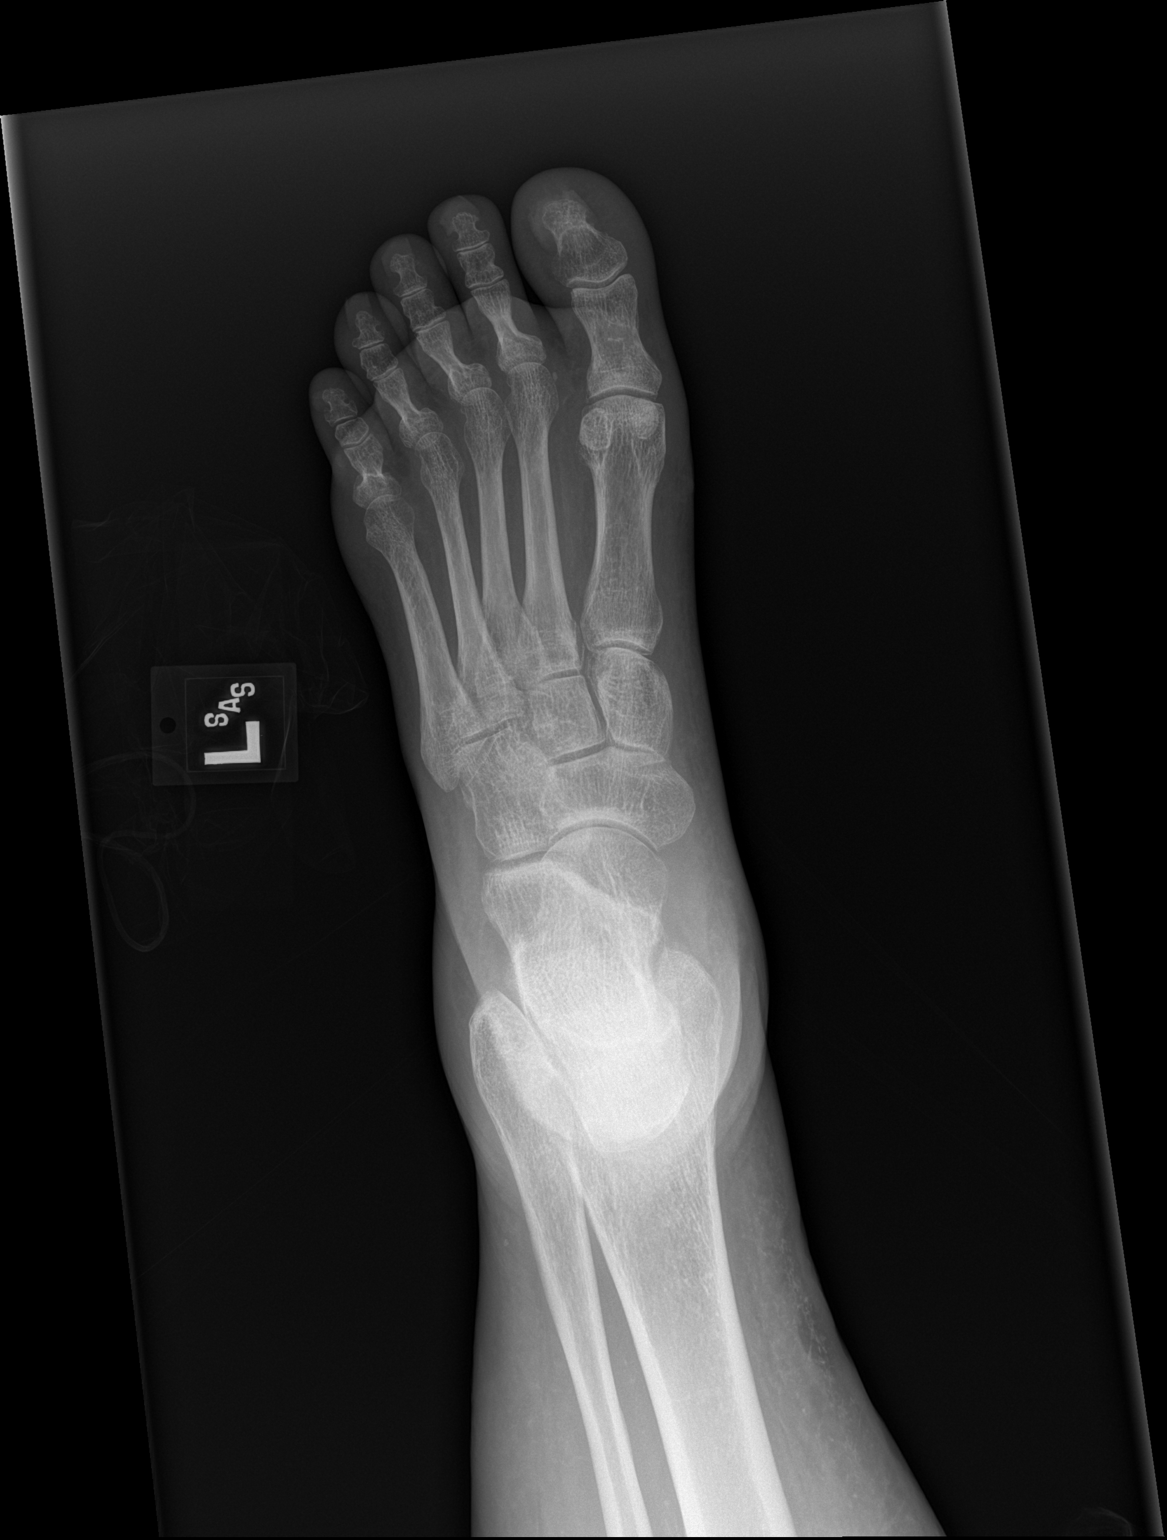

[x foot obl left]
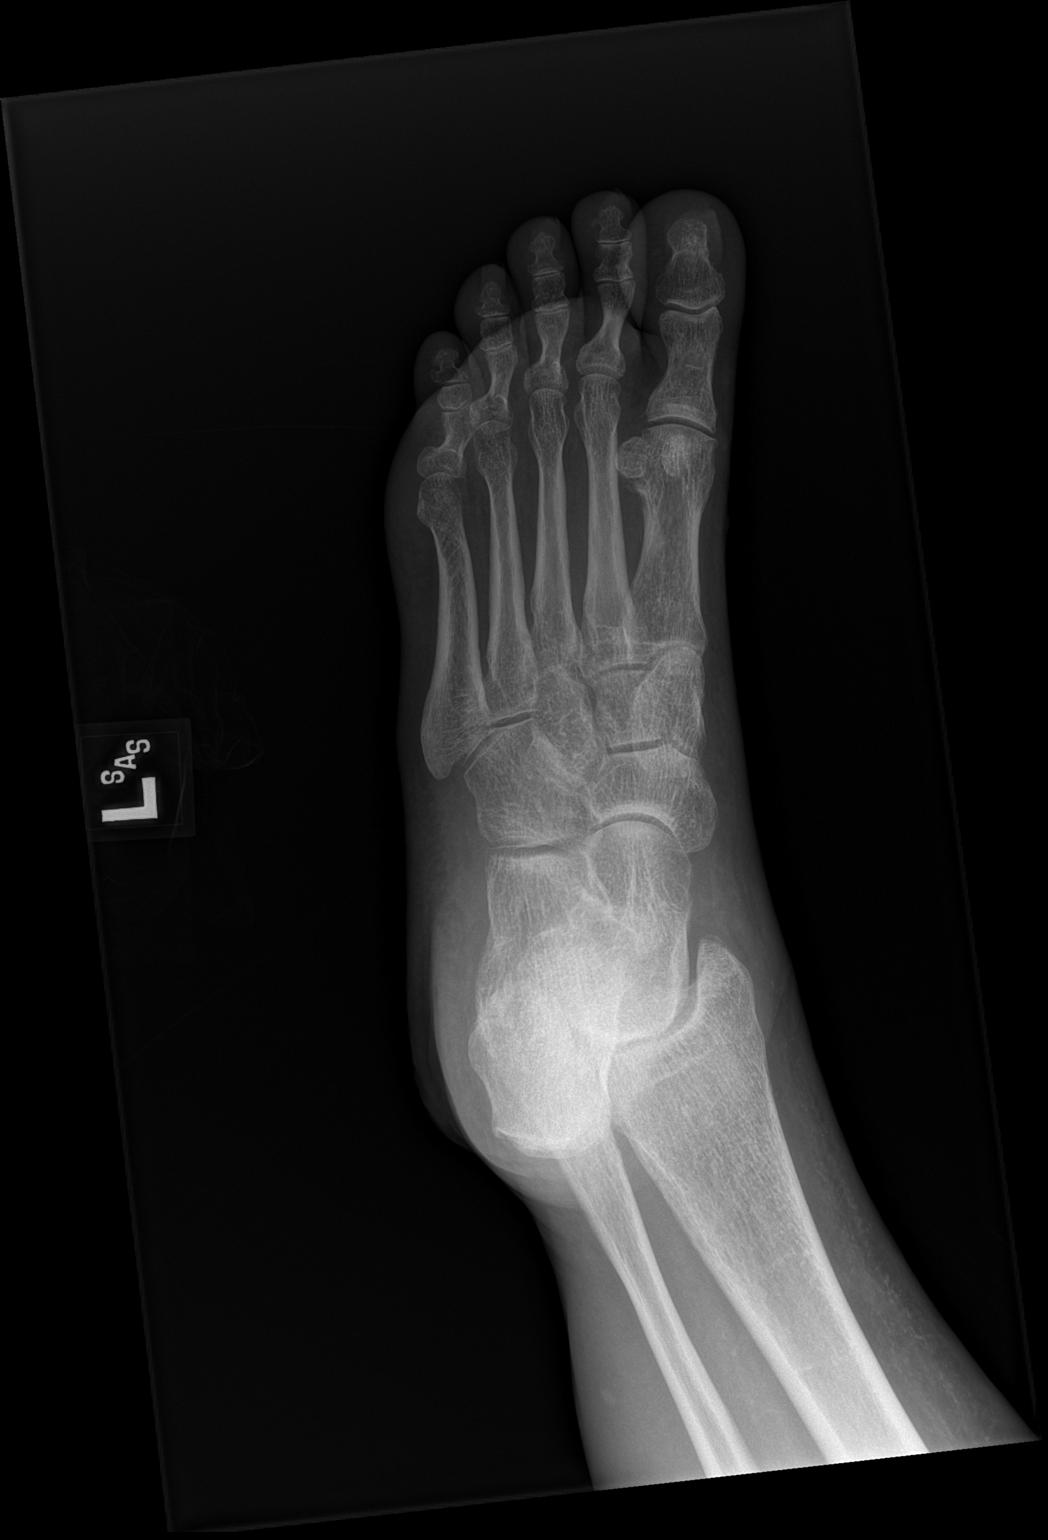

[x foot lat left]
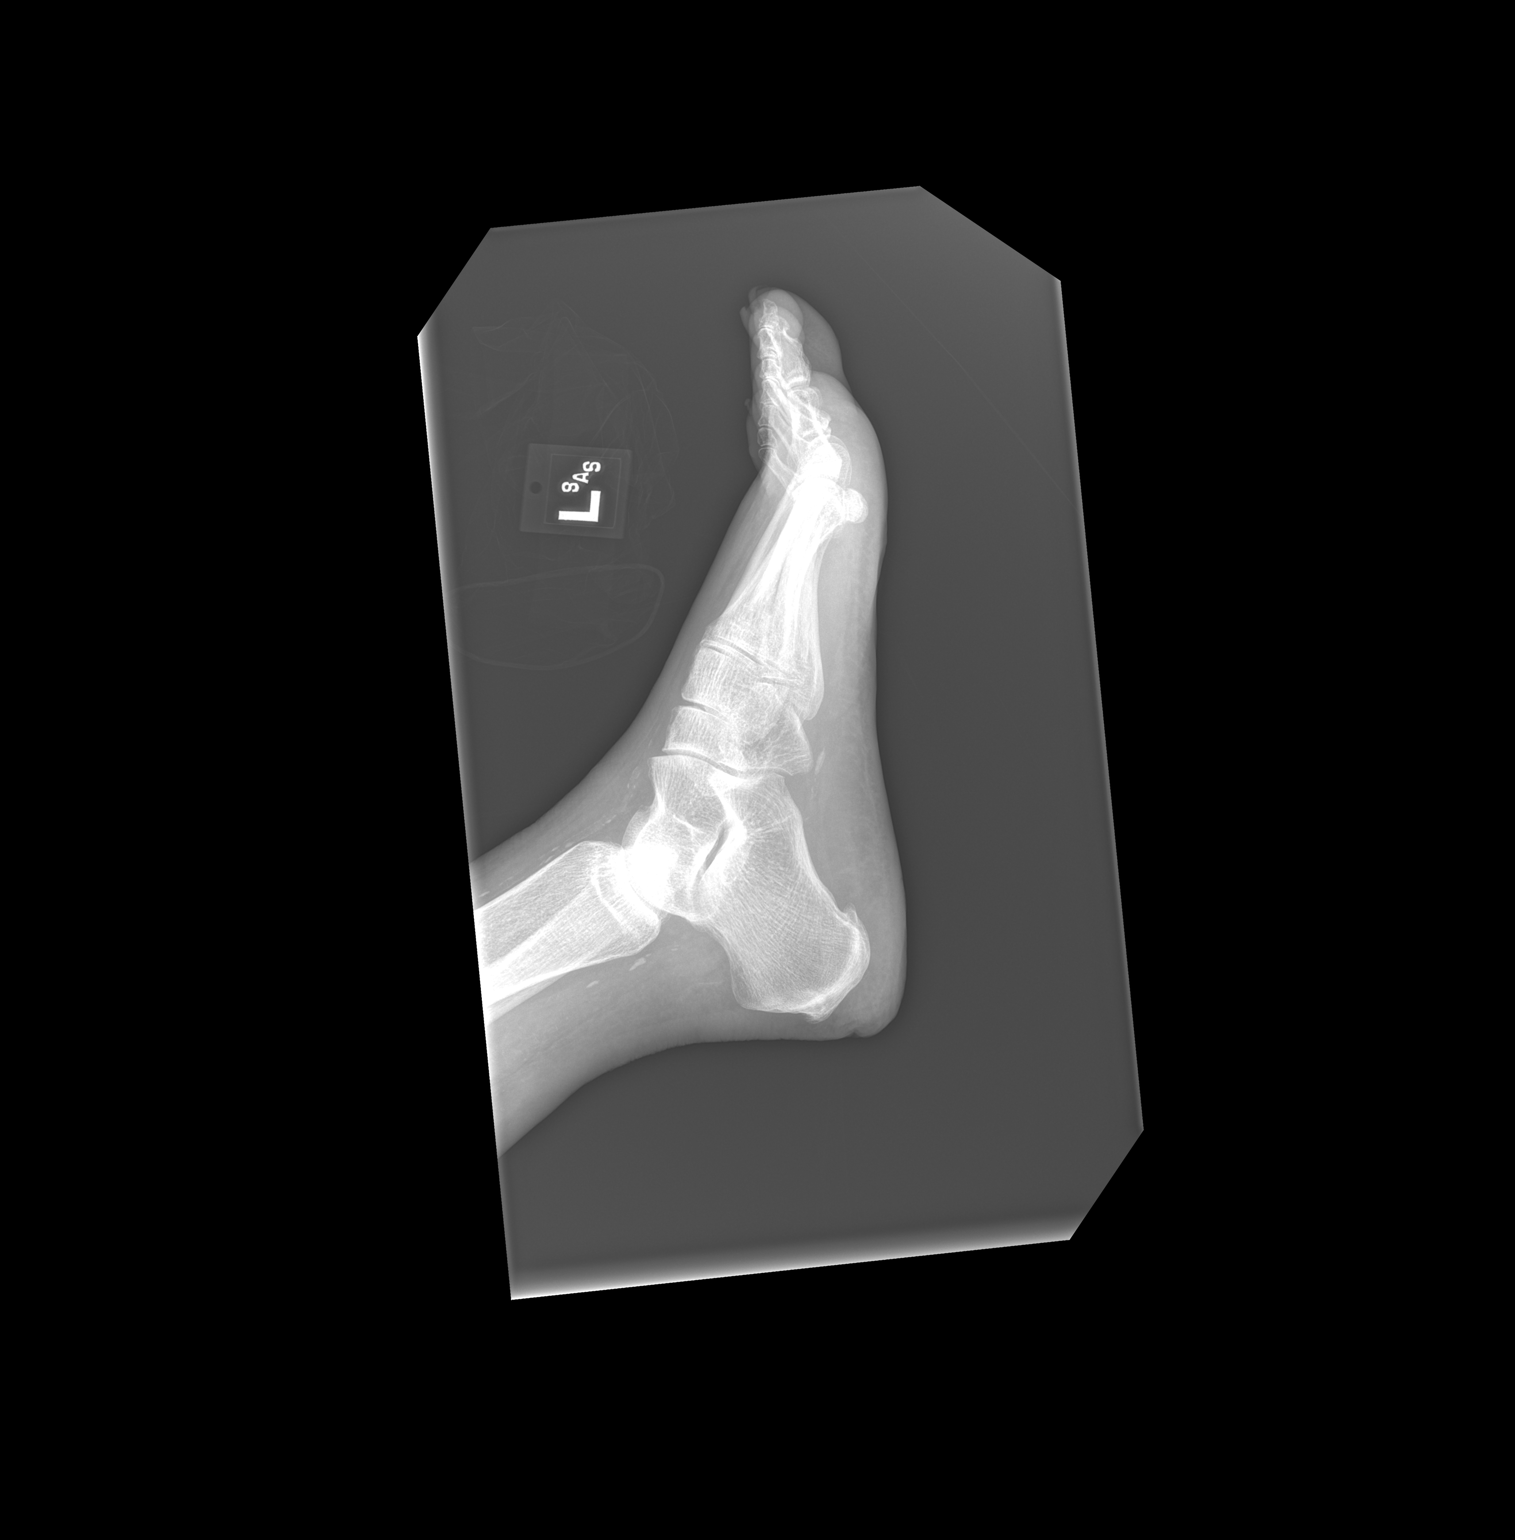

[3 of 3 positions shown; findings below may reference images not displayed]

FINDINGS: There is no evidence of fracture or dislocation. There is no
evidence of arthropathy or other focal bone abnormality. Soft
tissues are unremarkable.
IMPRESSION: No acute abnormality noted.

## 2017-11-29 ENCOUNTER — Other Ambulatory Visit: Payer: Self-pay | Admitting: Cardiovascular Disease

## 2017-11-29 DIAGNOSIS — I739 Peripheral vascular disease, unspecified: Secondary | ICD-10-CM

## 2017-12-04 IMAGING — CR DG FOOT COMPLETE 3+V*R*
3 series · 3 of 3 positions shown · non-contrast
Comparison: None.

CLINICAL DATA: Nonhealing ulcer second digit

EXAM:
RIGHT FOOT COMPLETE - 3+ VIEW

[t foot ap right]
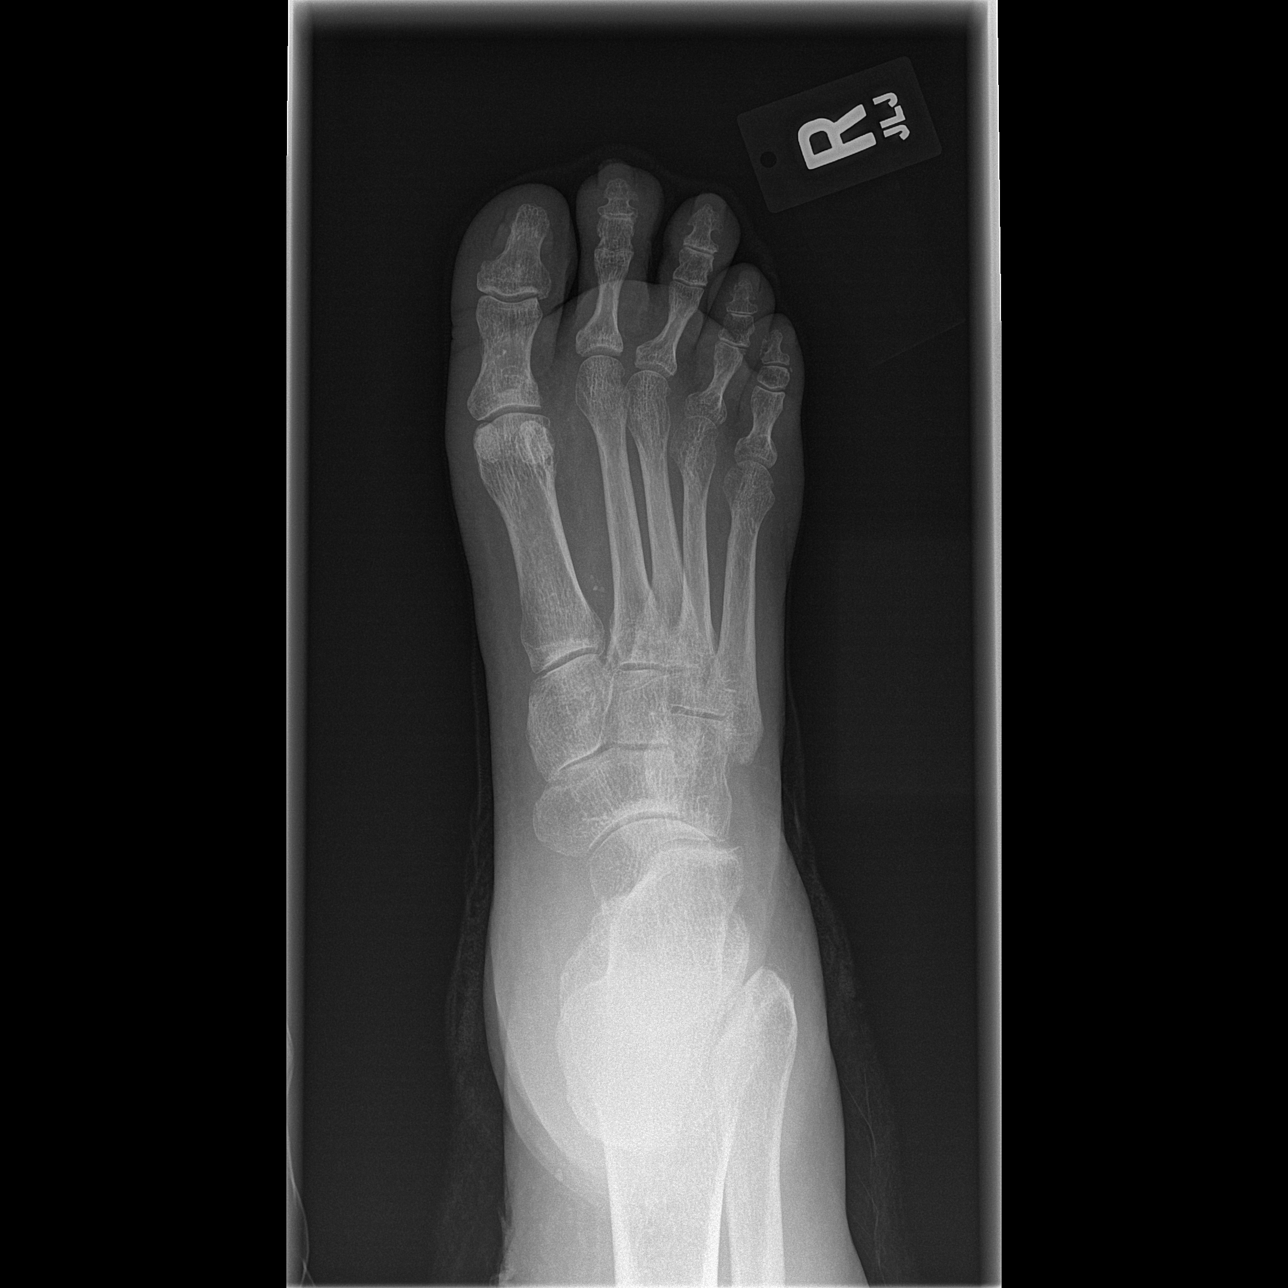

[t foot oblique right]
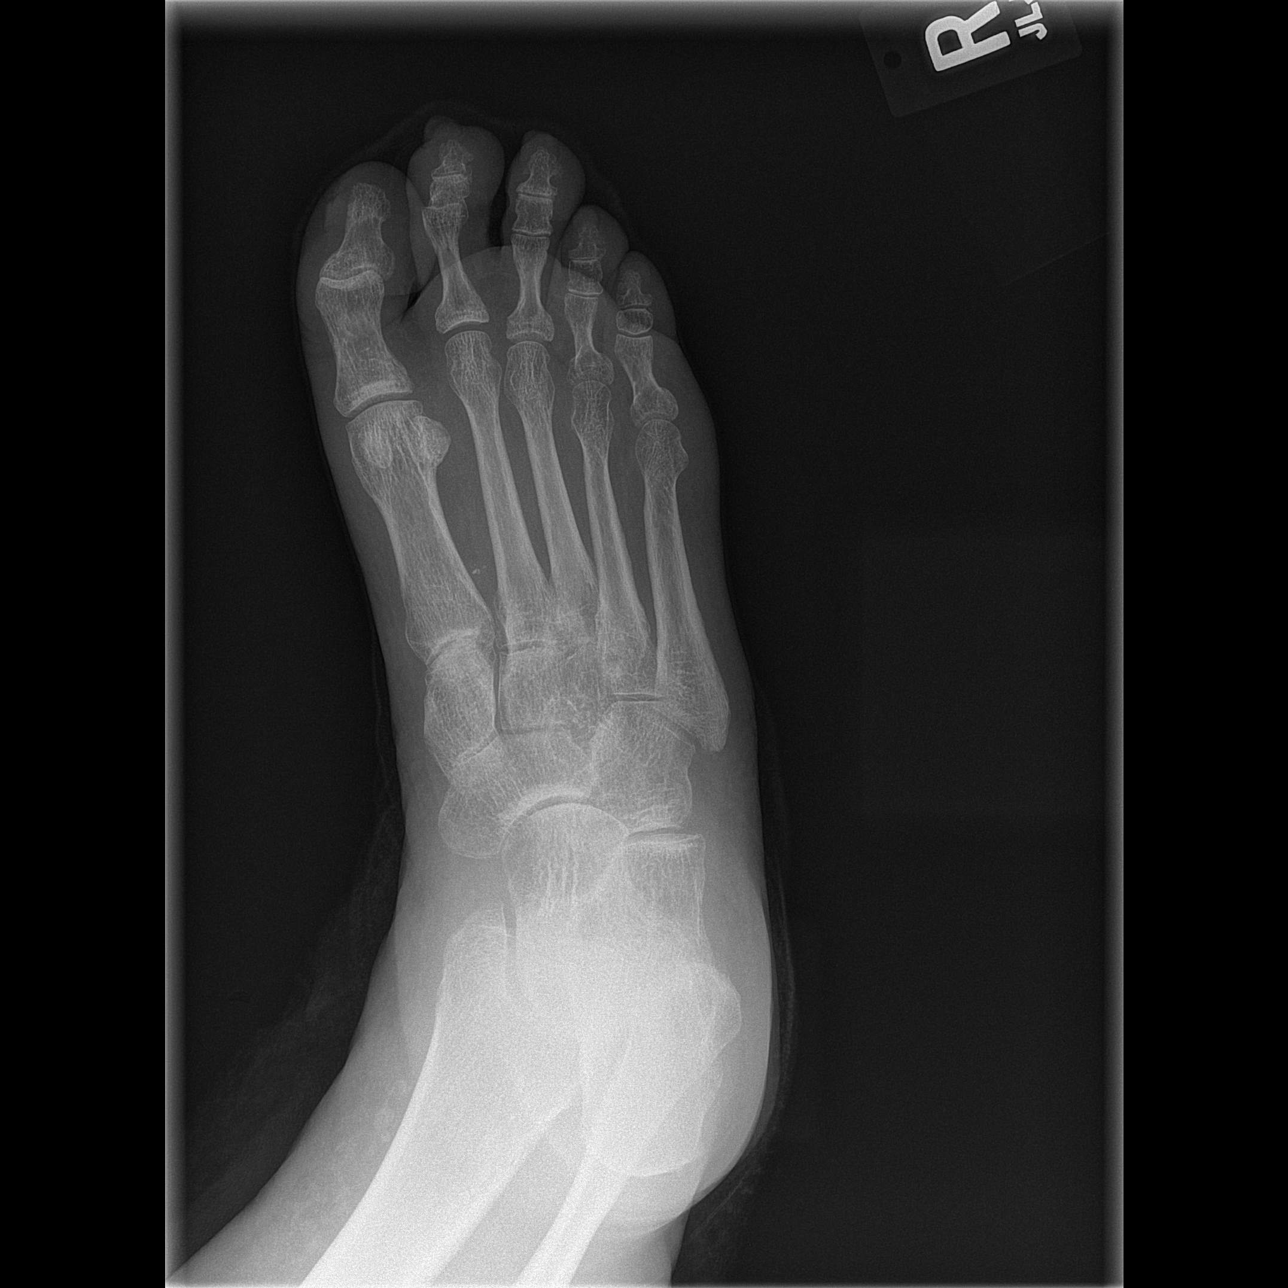

[t foot lat right]
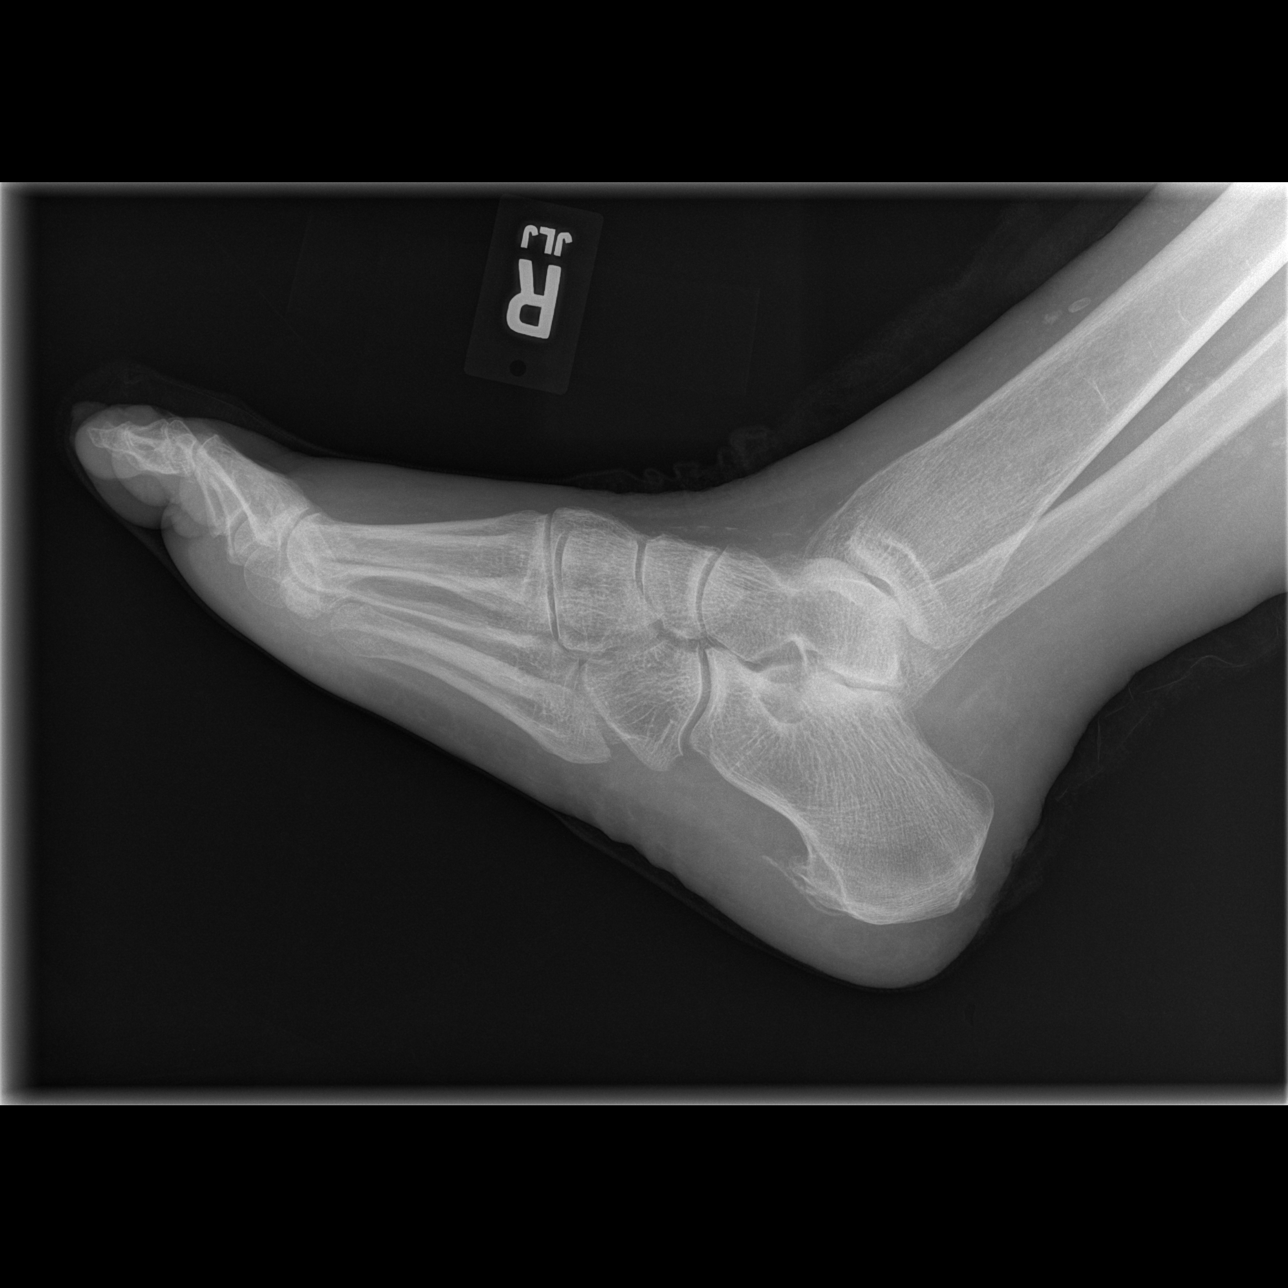

[3 of 3 positions shown; findings below may reference images not displayed]

FINDINGS: Frontal, oblique, and lateral views obtained. No fracture or
dislocation. No erosive change or bony destruction. There is mild
subluxation at the second PIP joint. No appreciable joint space
narrowing or erosion. There is an inferior calcaneal spur with
plantar calcification at the hindfoot level. There are scattered
foci of arterial vascular calcification. There is mild soft tissue
swelling dorsally in the mid and forefoot regions.
IMPRESSION: Areas of soft tissue swelling. Mild subluxation second PIP joint. No
frank dislocation or fracture. No erosive change or bony
destruction. No soft tissue ulceration or radiopaque foreign body
evident by radiography.

There is plantar fascia calcification with a nearby inferior
calcaneal spur. There are foci of arterial vascular calcification.

## 2017-12-05 ENCOUNTER — Other Ambulatory Visit: Payer: Self-pay | Admitting: Cardiology

## 2017-12-05 DIAGNOSIS — I480 Paroxysmal atrial fibrillation: Secondary | ICD-10-CM

## 2017-12-20 IMAGING — DX DG TOE 2ND 2+V*R*
3 series · 3 of 3 positions shown · non-contrast
Comparison: Right foot dated 03/21/2017.

CLINICAL DATA: Right second toe open wound. Clinical concern for
osteomyelitis.

EXAM:
RIGHT SECOND TOE

[toe ap]
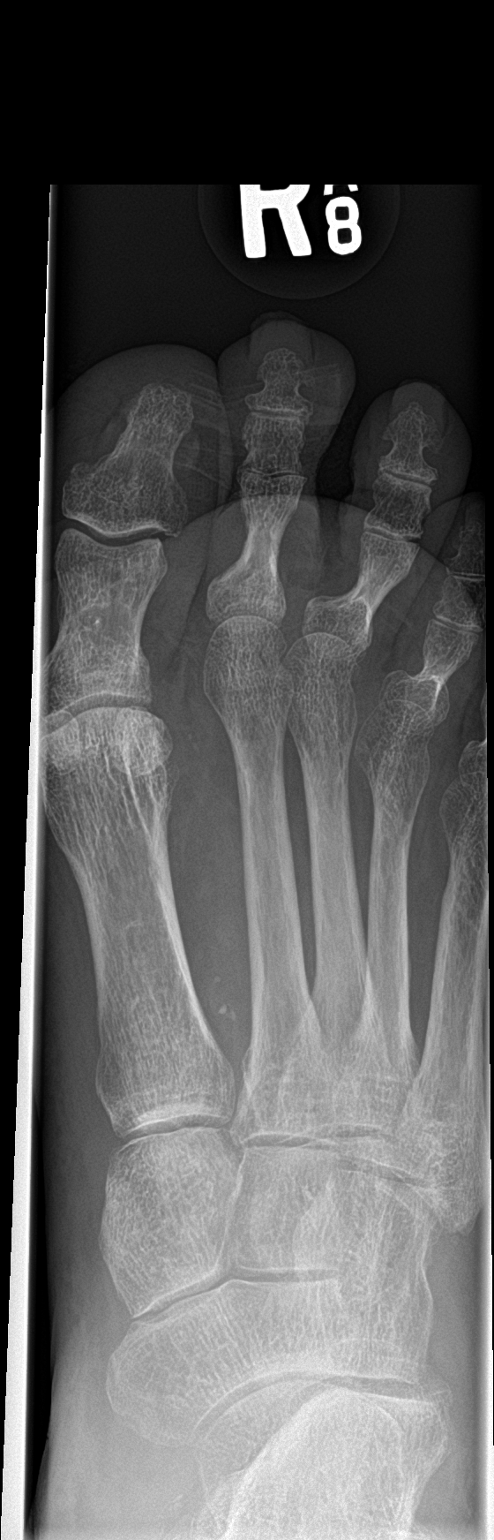

[toe obl]
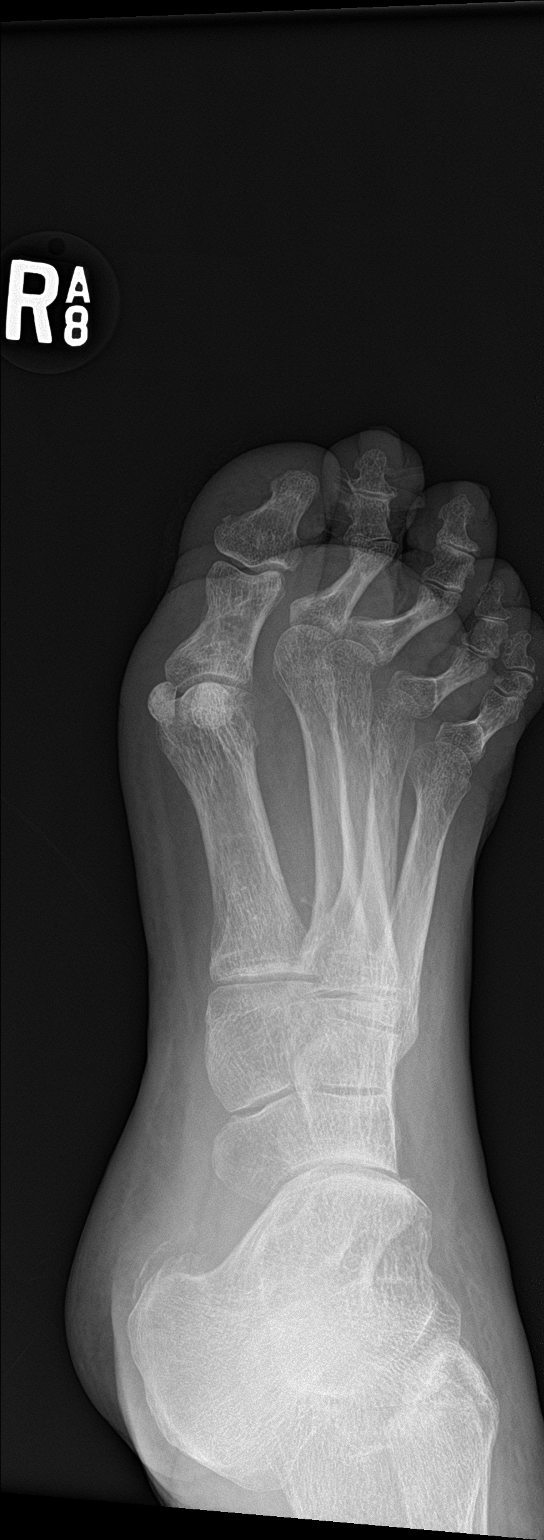

[toe lat]
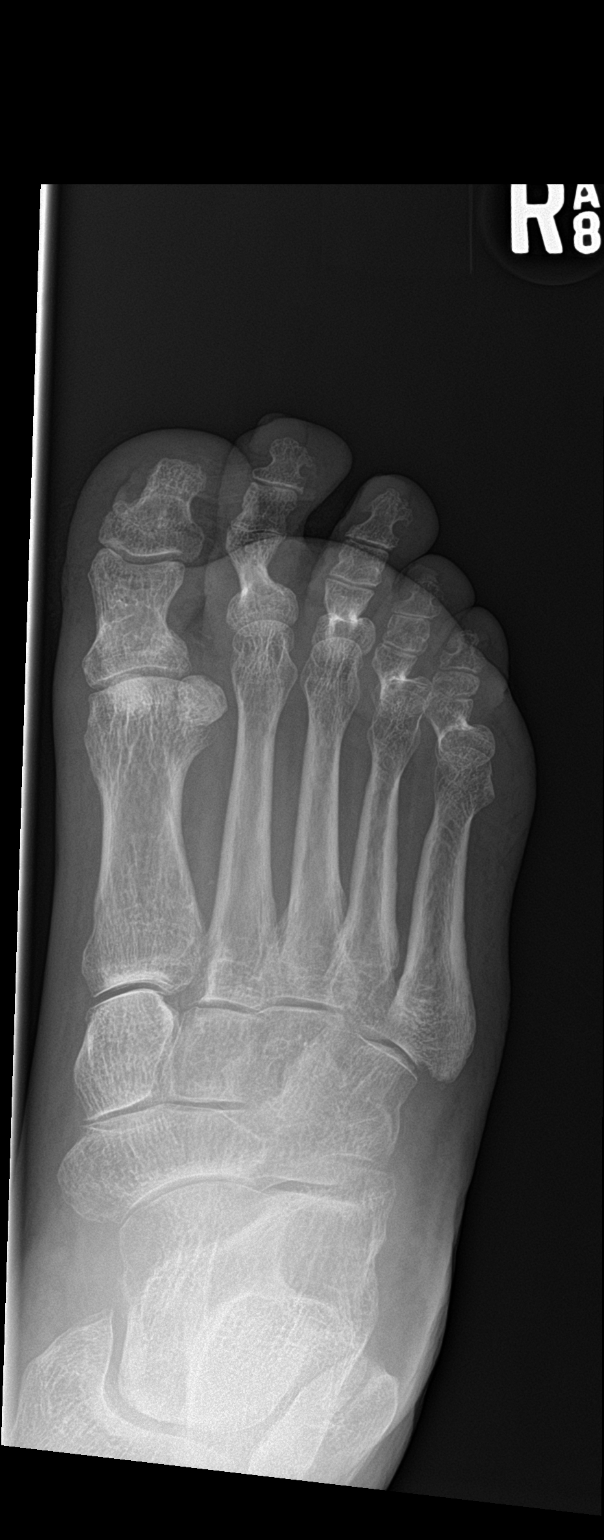

[3 of 3 positions shown; findings below may reference images not displayed]

FINDINGS: PA and 2 oblique views of the right second toe were obtained. There
is no lateral view. The patient was not able to cooperate for a true
lateral view. There is soft tissue irregularity along the medial
aspect of the third toe. No fracture, dislocation, bone erosion,
periosteal reaction or soft tissue gas seen.
IMPRESSION: No radiographic evidence of osteomyelitis.

## 2017-12-20 IMAGING — DX DG CHEST 1V PORT
1 series · 1 of 1 positions shown · non-contrast
Comparison: 55 y/o  F; 02/22/2016 chest radiograph.

CLINICAL DATA: 55 y/o  F; possible medication overdose.

EXAM:
PORTABLE CHEST 1 VIEW

[chest ap]
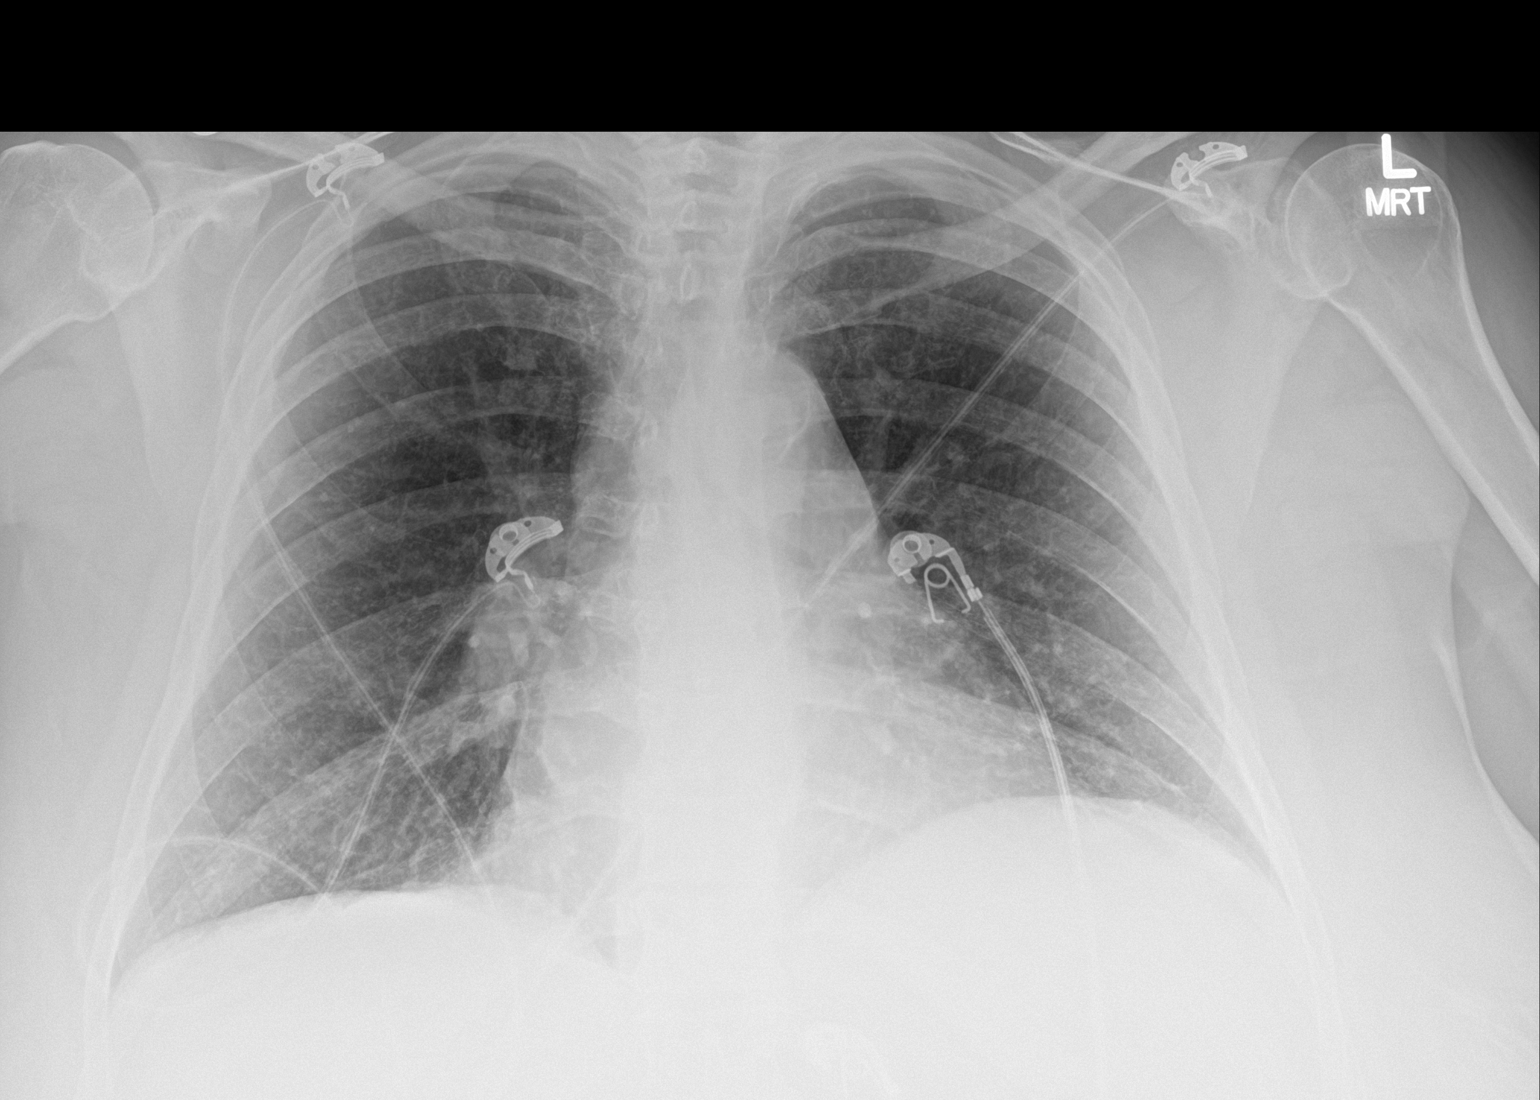

[1 of 1 positions shown; findings below may reference images not displayed]

FINDINGS: Normal cardiac silhouette. Aortic atherosclerosis with
calcification. Clear lungs. No pleural effusion or pneumothorax.
Bones are unremarkable.
IMPRESSION: No active disease.

By: Chai Tiger M.D.

## 2017-12-21 IMAGING — DX DG TIBIA/FIBULA 2V*L*
4 series · 4 of 4 positions shown · non-contrast
Comparison: Left foot 03/15/2017.  Left knee 01/07/2016

CLINICAL DATA: Cellulitis and open wounds on both legs.

EXAM:
LEFT TIBIA AND FIBULA - 2 VIEW

[tibia ap (1 of 2)]
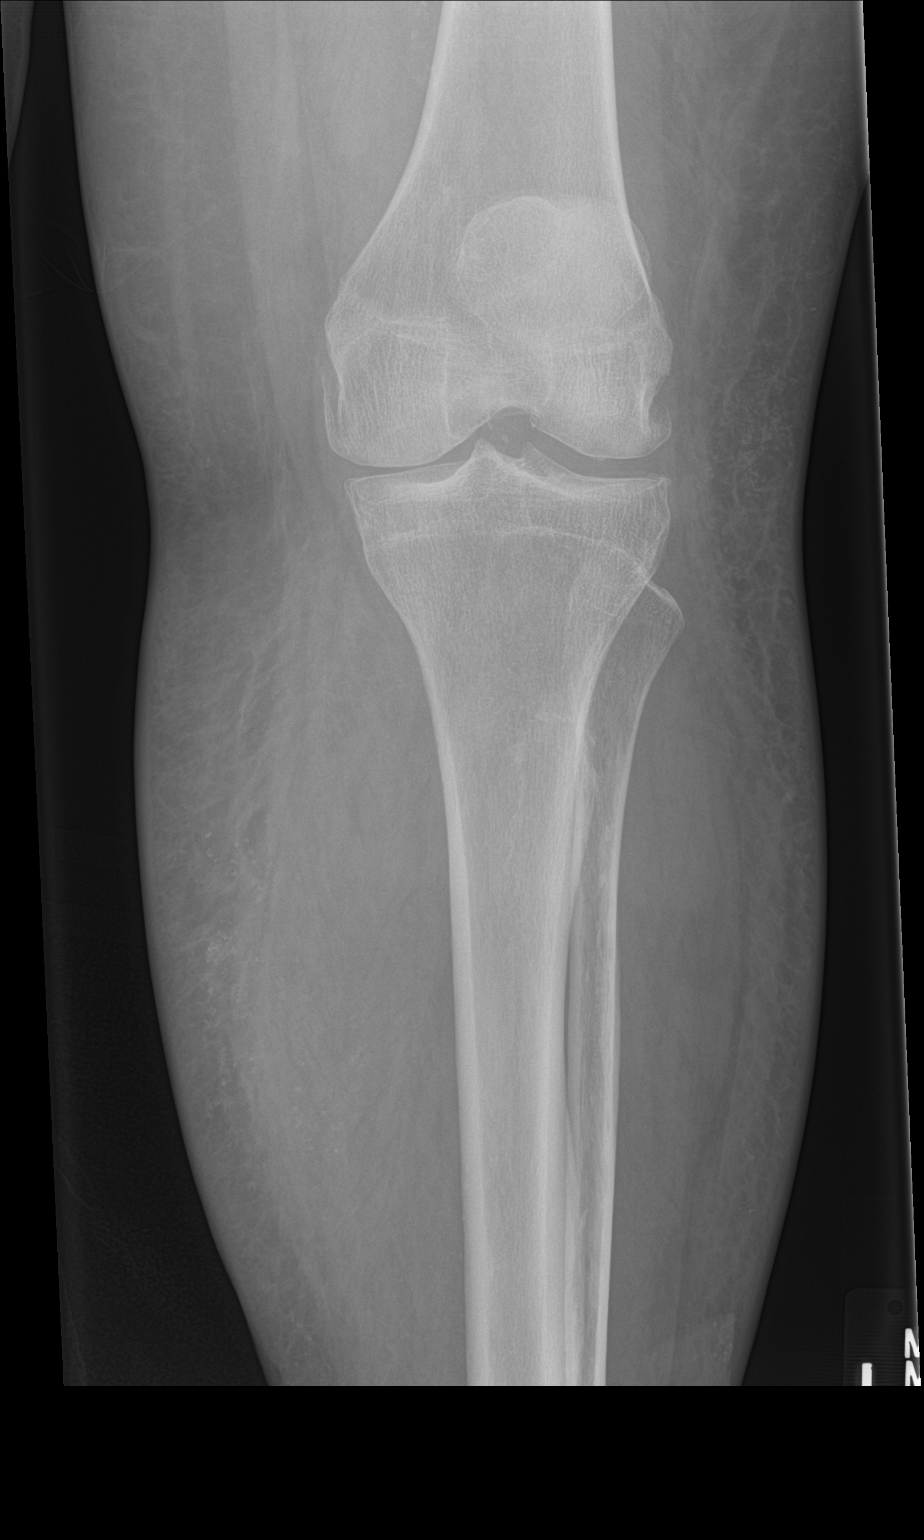

[tibia ap (2 of 2)]
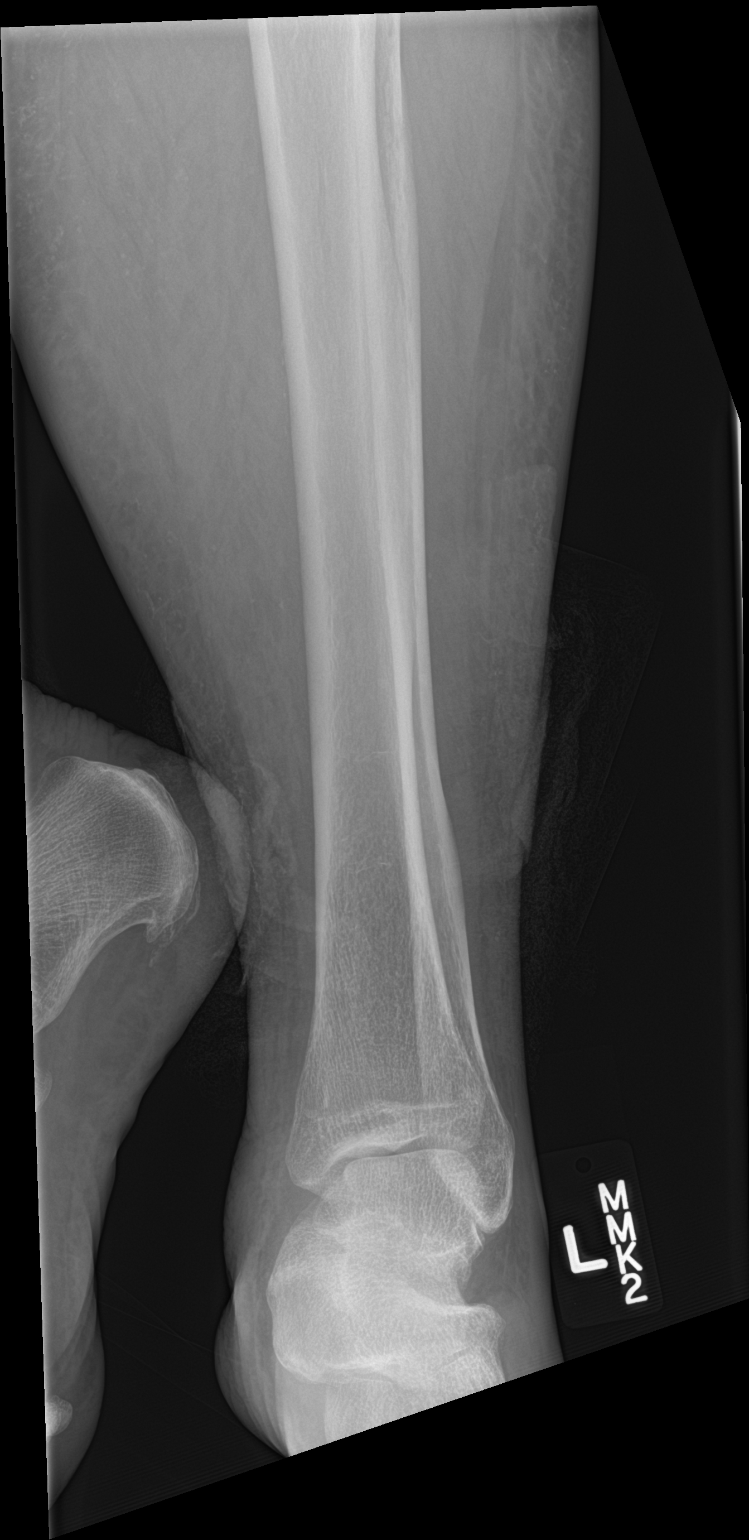

[tibia lat (1 of 2)]
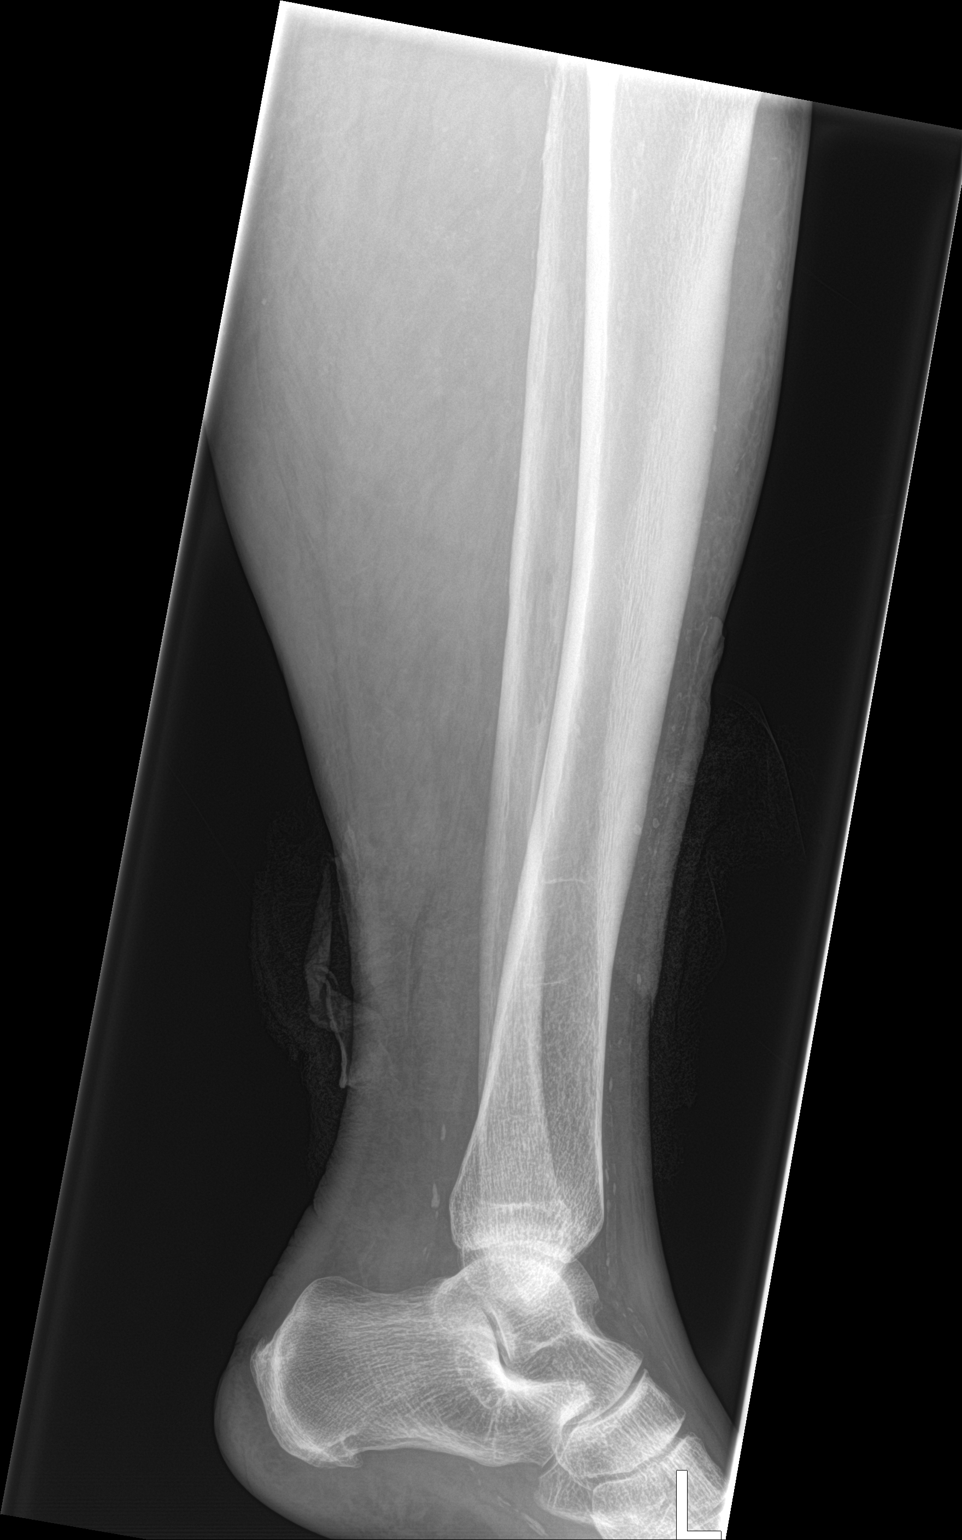

[tibia lat (2 of 2)]
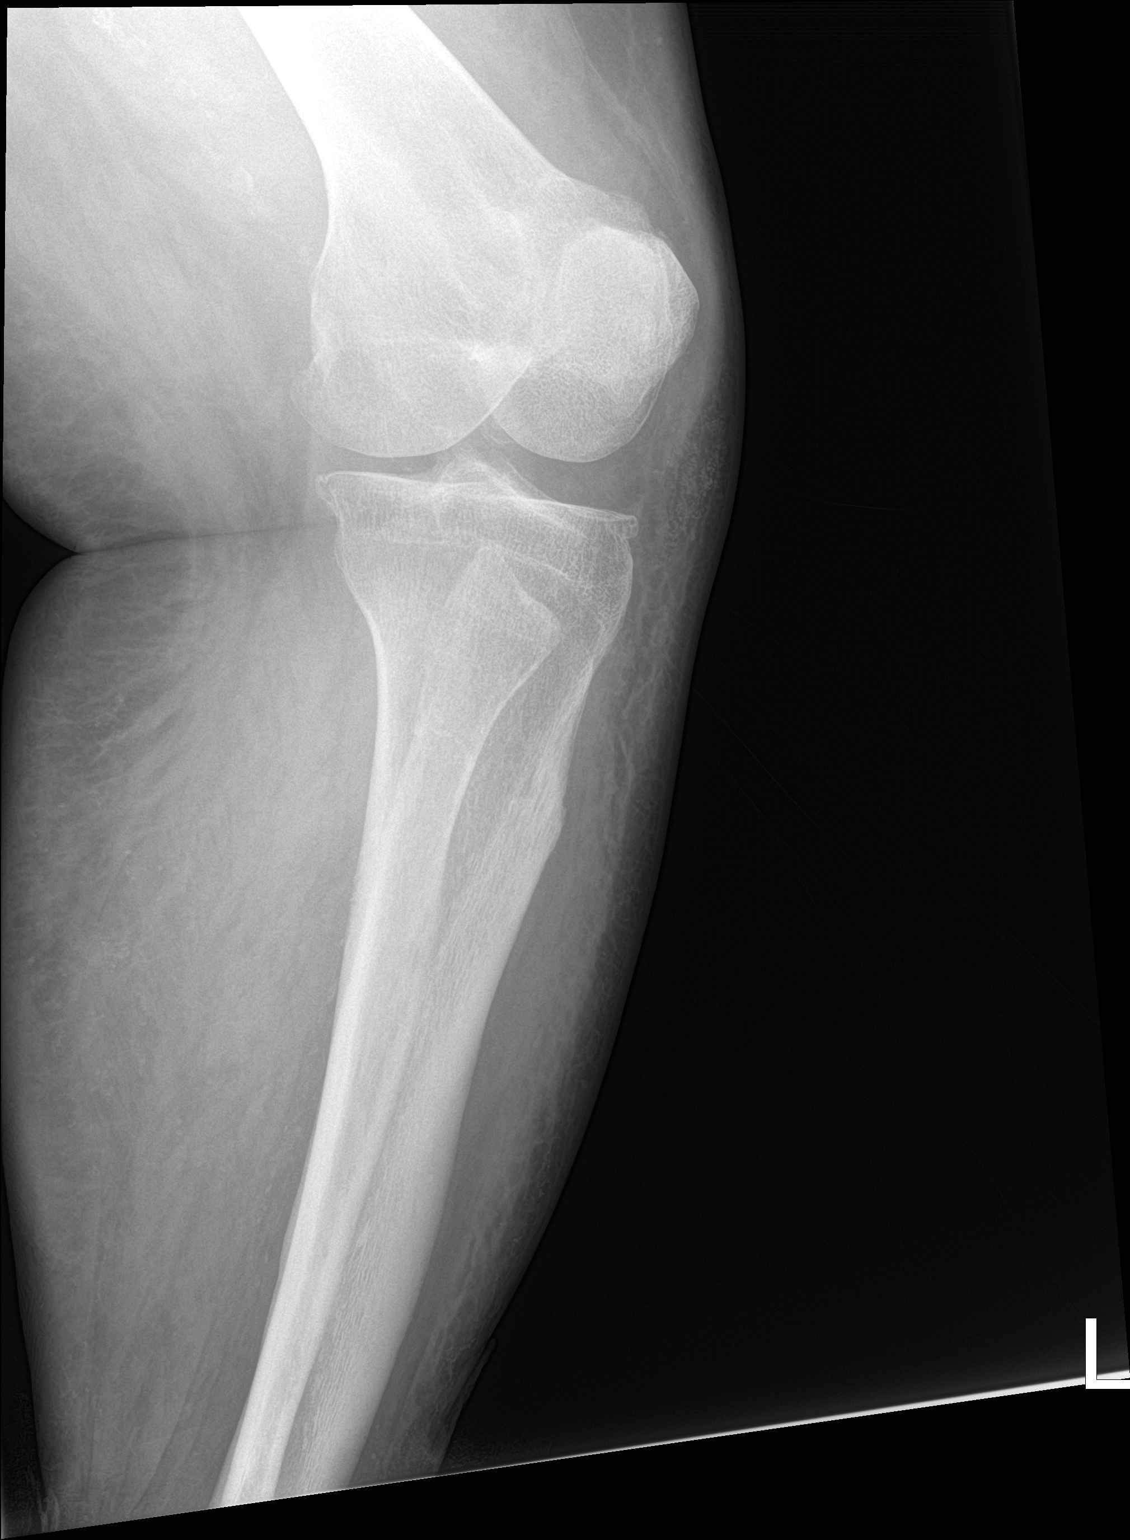

[4 of 4 positions shown; findings below may reference images not displayed]

FINDINGS: No evidence of acute fracture or dislocation of the left tibia or
fibula. No focal bone lesion or bone destruction. Bone cortex
appears intact. Soft tissue calcifications consistent with vascular
calcifications and phleboliths. Infiltration in the subcutaneous fat
likely to represent edema or cellulitis. No soft tissue gas
collections. No radiopaque foreign bodies.
IMPRESSION: No acute bony abnormalities. Subcutaneous soft tissue infiltration
likely represent cellulitis or edema.

## 2017-12-21 IMAGING — DX DG TIBIA/FIBULA 2V*R*
4 series · 4 of 4 positions shown · non-contrast
Comparison: Right foot 03/21/2017.  Right knee 08/11/2016.

CLINICAL DATA: Cellulitis and open wounds on both legs.

EXAM:
RIGHT TIBIA AND FIBULA - 2 VIEW

[tibia ap (1 of 2)]
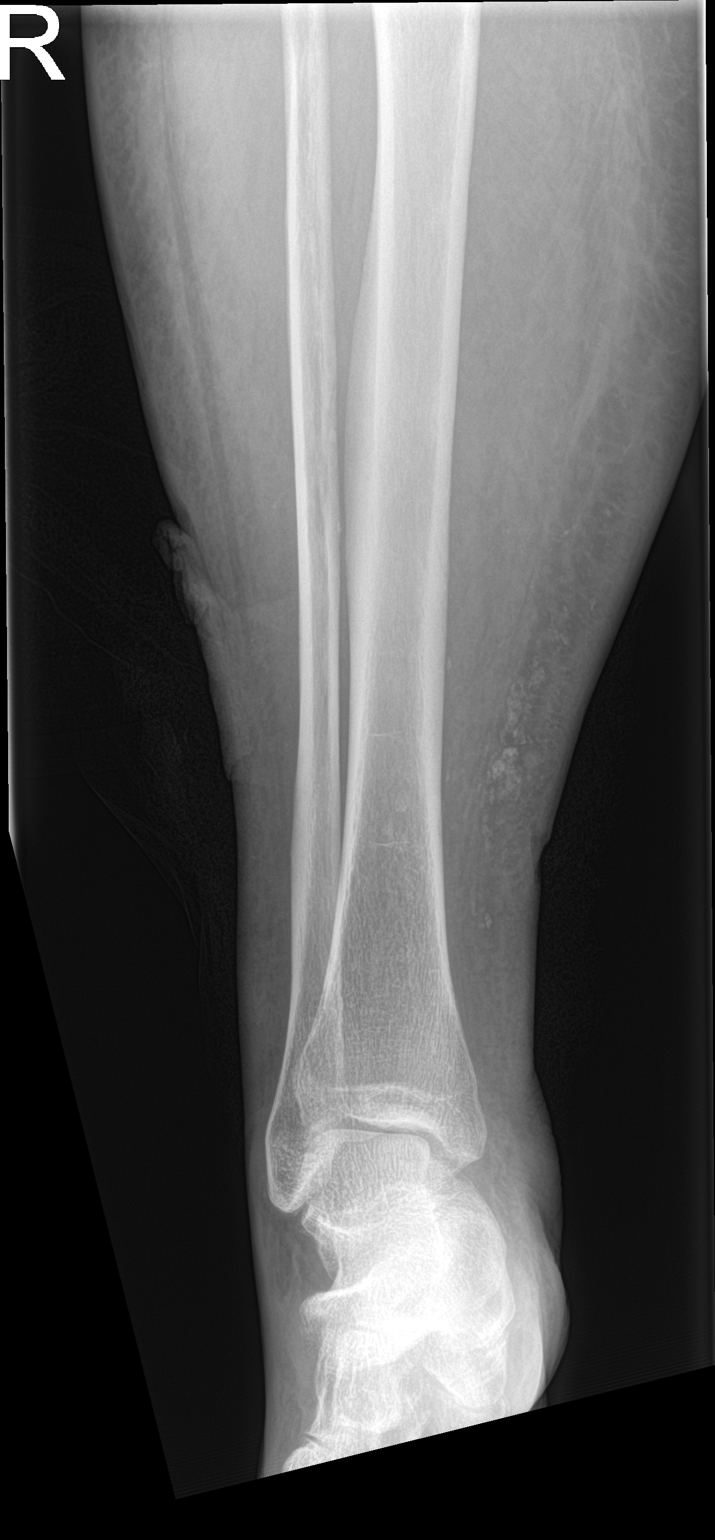

[tibia ap (2 of 2)]
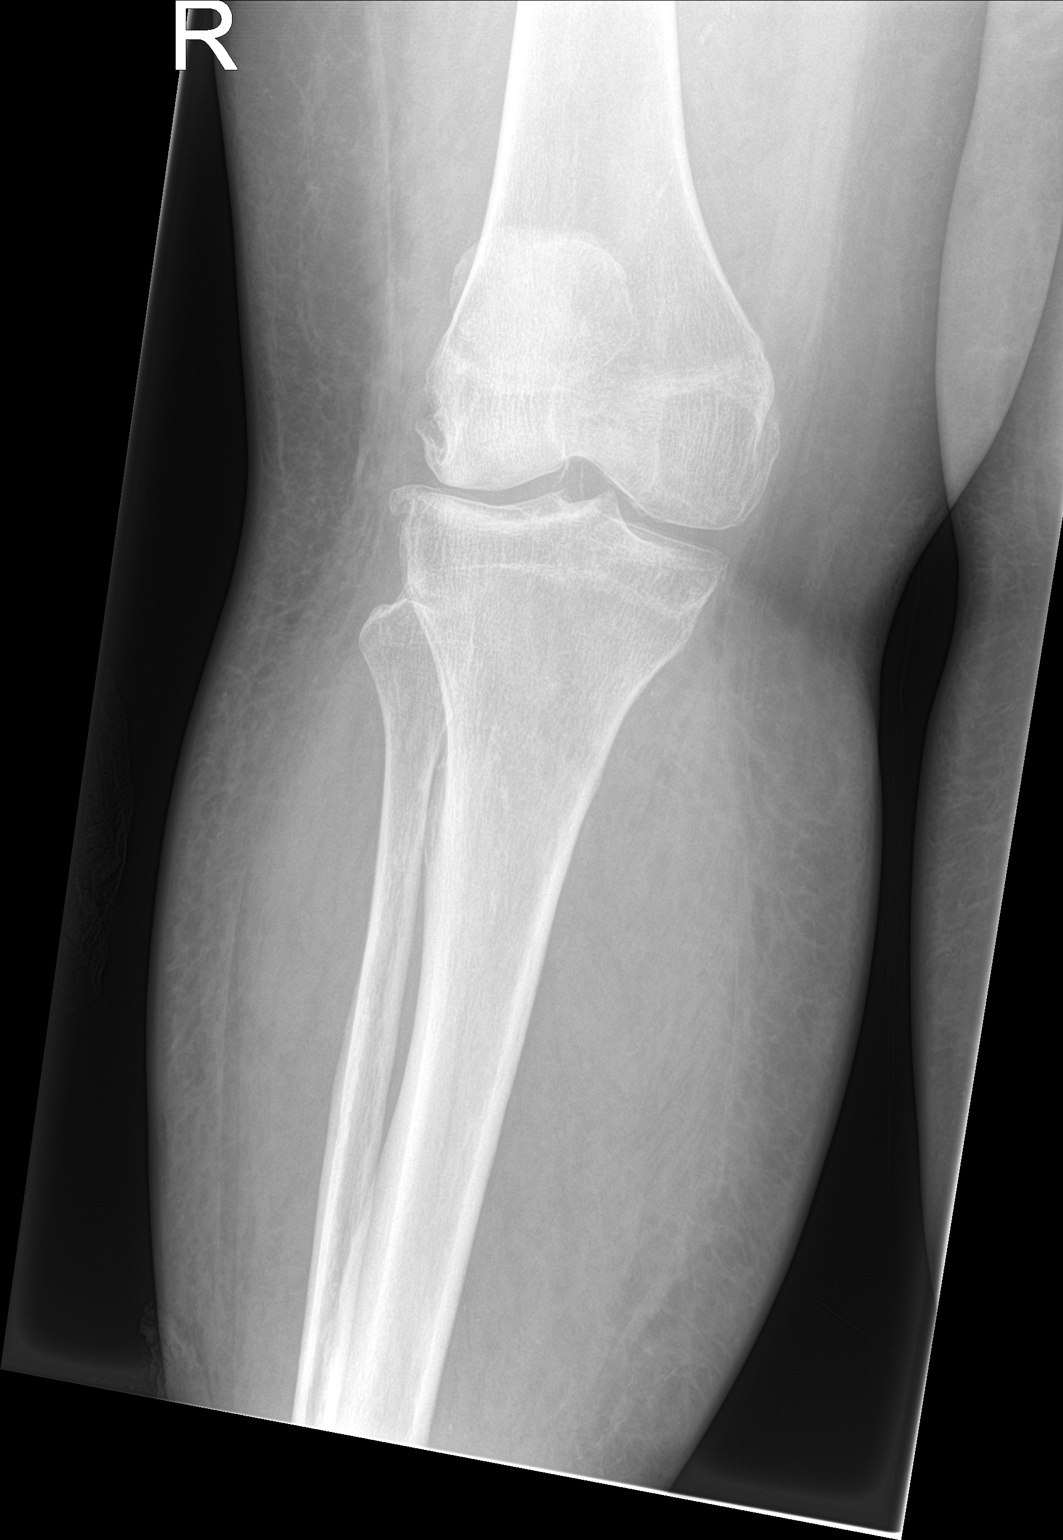

[tibia lat (1 of 2)]
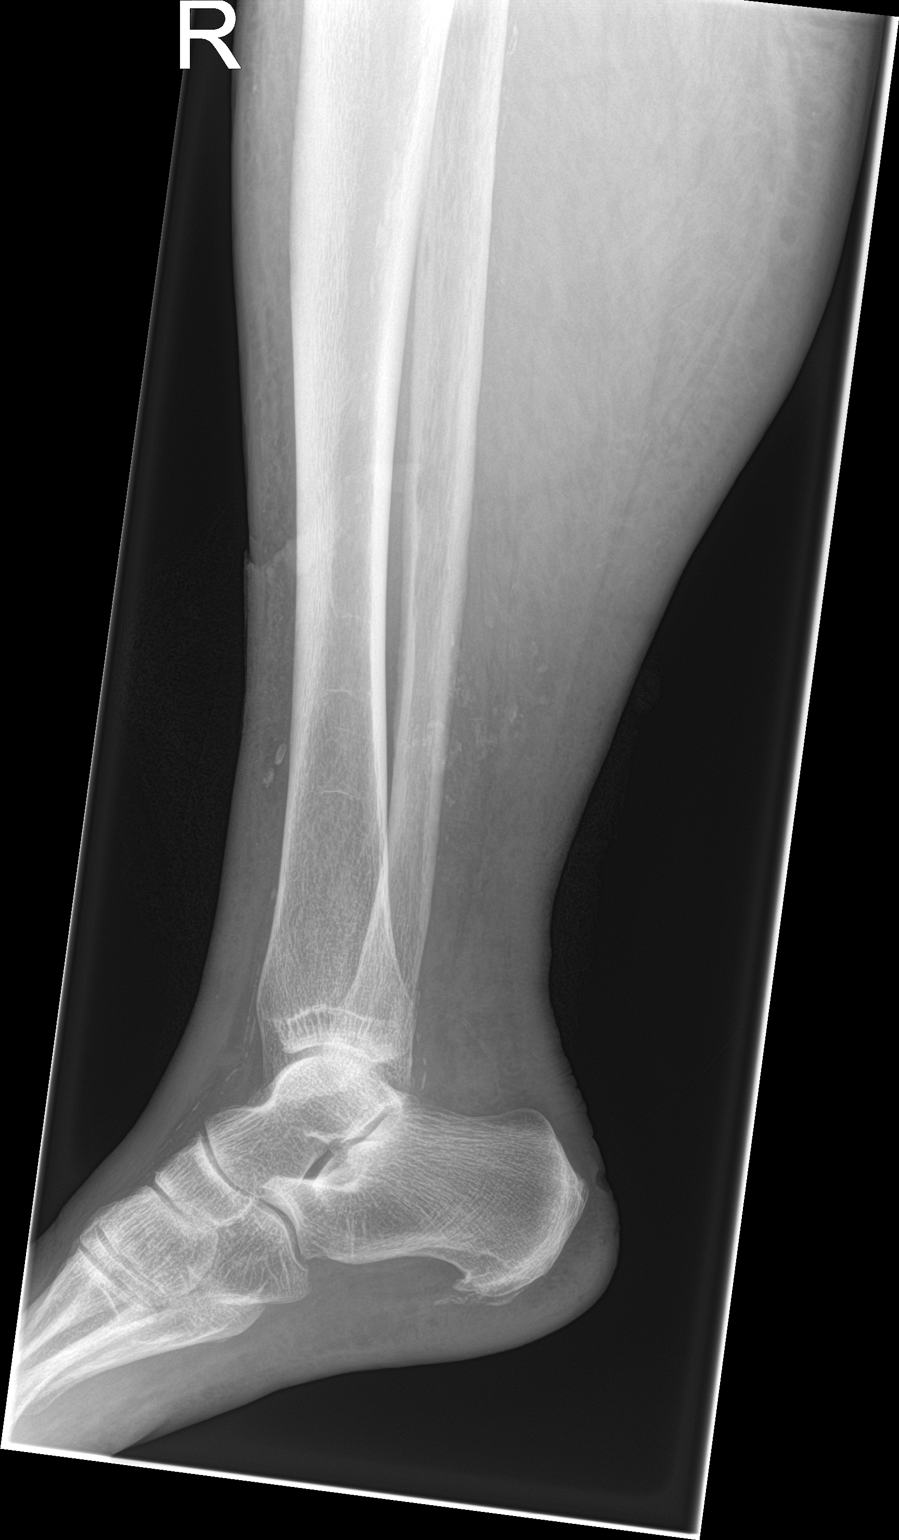

[tibia lat (2 of 2)]
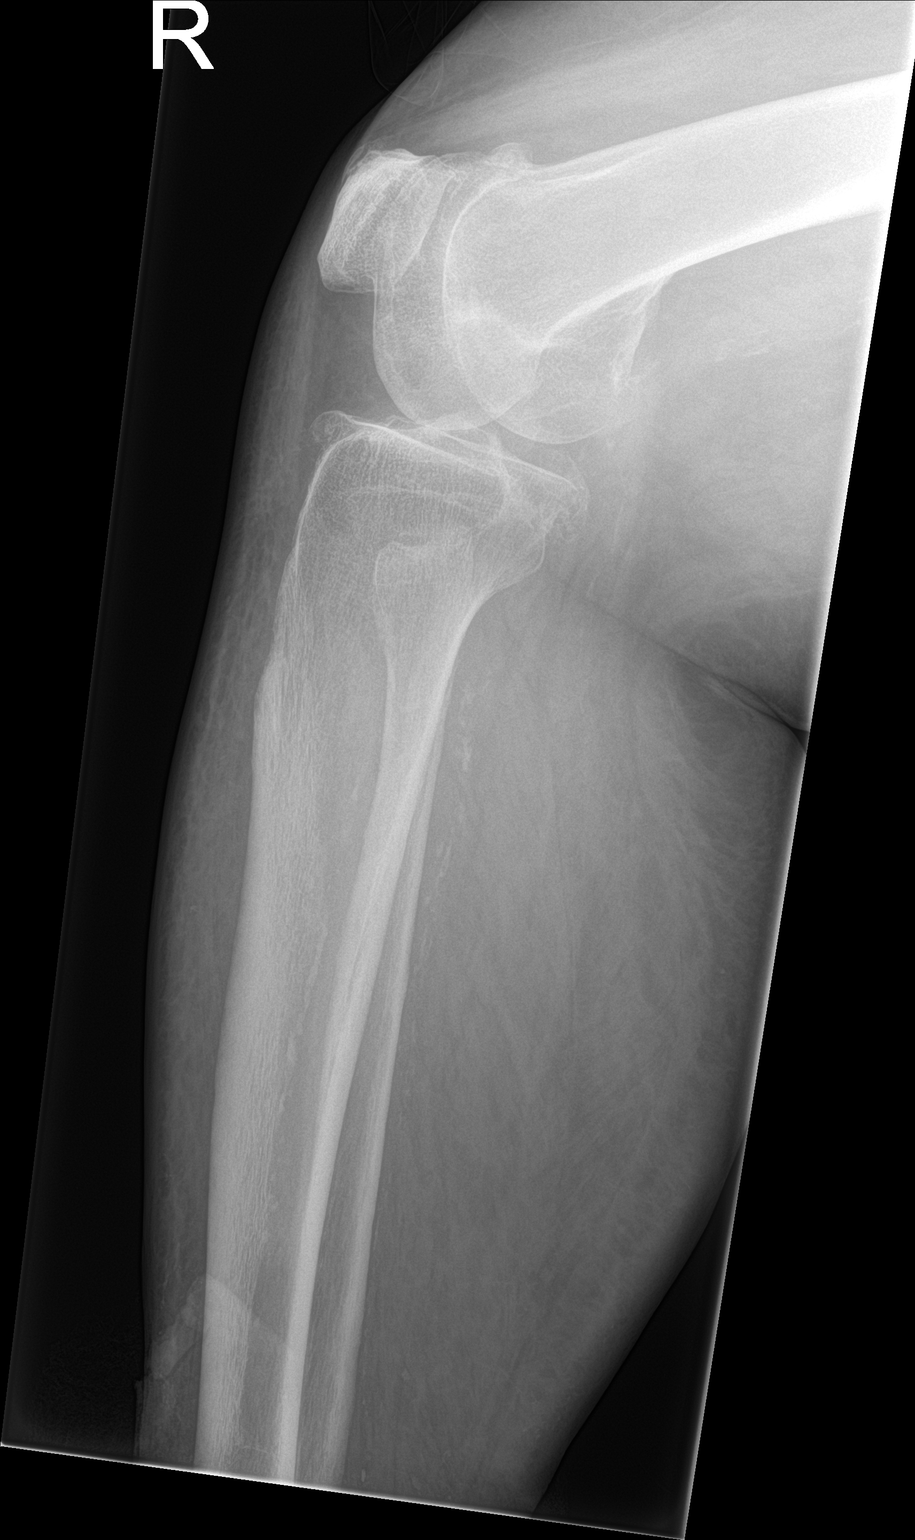

[4 of 4 positions shown; findings below may reference images not displayed]

FINDINGS: No evidence of acute fracture or dislocation of the right tibia or
fibula. No focal bone lesion or bone destruction. Bone cortex
appears intact without erosion. Soft tissue calcifications
consistent with phleboliths and vascular calcifications.
Infiltration of the subcutaneous fat likely to represent edema or
cellulitis. No radiopaque soft tissue foreign bodies or gas
collections. Soft tissue defect along the distal lateral lower leg
consistent with history of ulceration.
IMPRESSION: No acute bony abnormalities. Infiltration in the subcutaneous fatty
tissues consistent with cellulitis or edema.

## 2017-12-21 IMAGING — DX DG CHEST 1V PORT
1 series · 1 of 1 positions shown · non-contrast
Comparison: 04/06/2017 .

CLINICAL DATA: Intubation.

EXAM:
PORTABLE CHEST 1 VIEW

[chest ap]
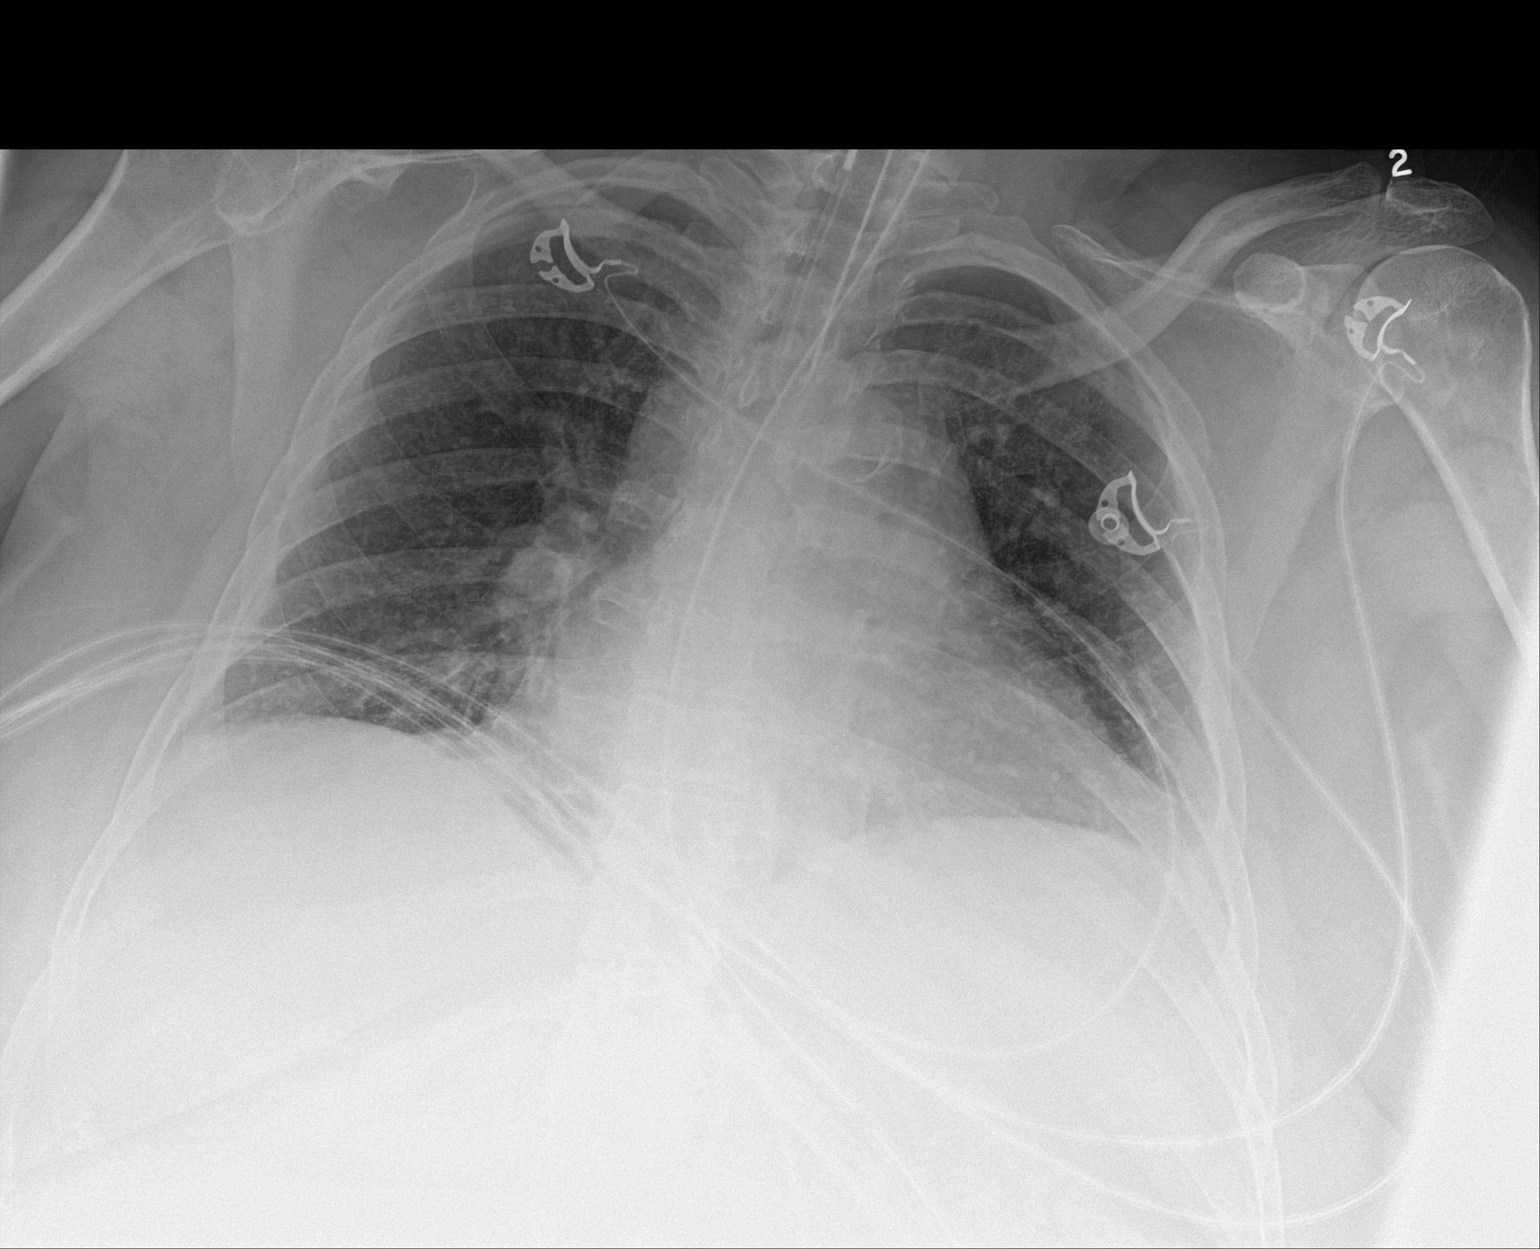

[1 of 1 positions shown; findings below may reference images not displayed]

FINDINGS: Surgical clips left neck. Endotracheal tube, NG tube in stable
position. Cardiomegaly with normal pulmonary vascularity. Low lung
volumes with mild basilar atelectasis. Tiny left pleural effusion
cannot be excluded. No pneumothorax.
IMPRESSION: 1. Lines and tubes in stable position.

2. Low lung volumes with mild basilar atelectasis. Tiny left pleural
effusion cannot be excluded.

## 2017-12-21 IMAGING — DX DG CHEST 1V PORT
1 series · 1 of 1 positions shown · non-contrast
Comparison: Chest radiograph performed 04/06/2017

CLINICAL DATA: Endotracheal tube and orogastric tube placement.
Initial encounter.

EXAM:
PORTABLE CHEST 1 VIEW

[chest]
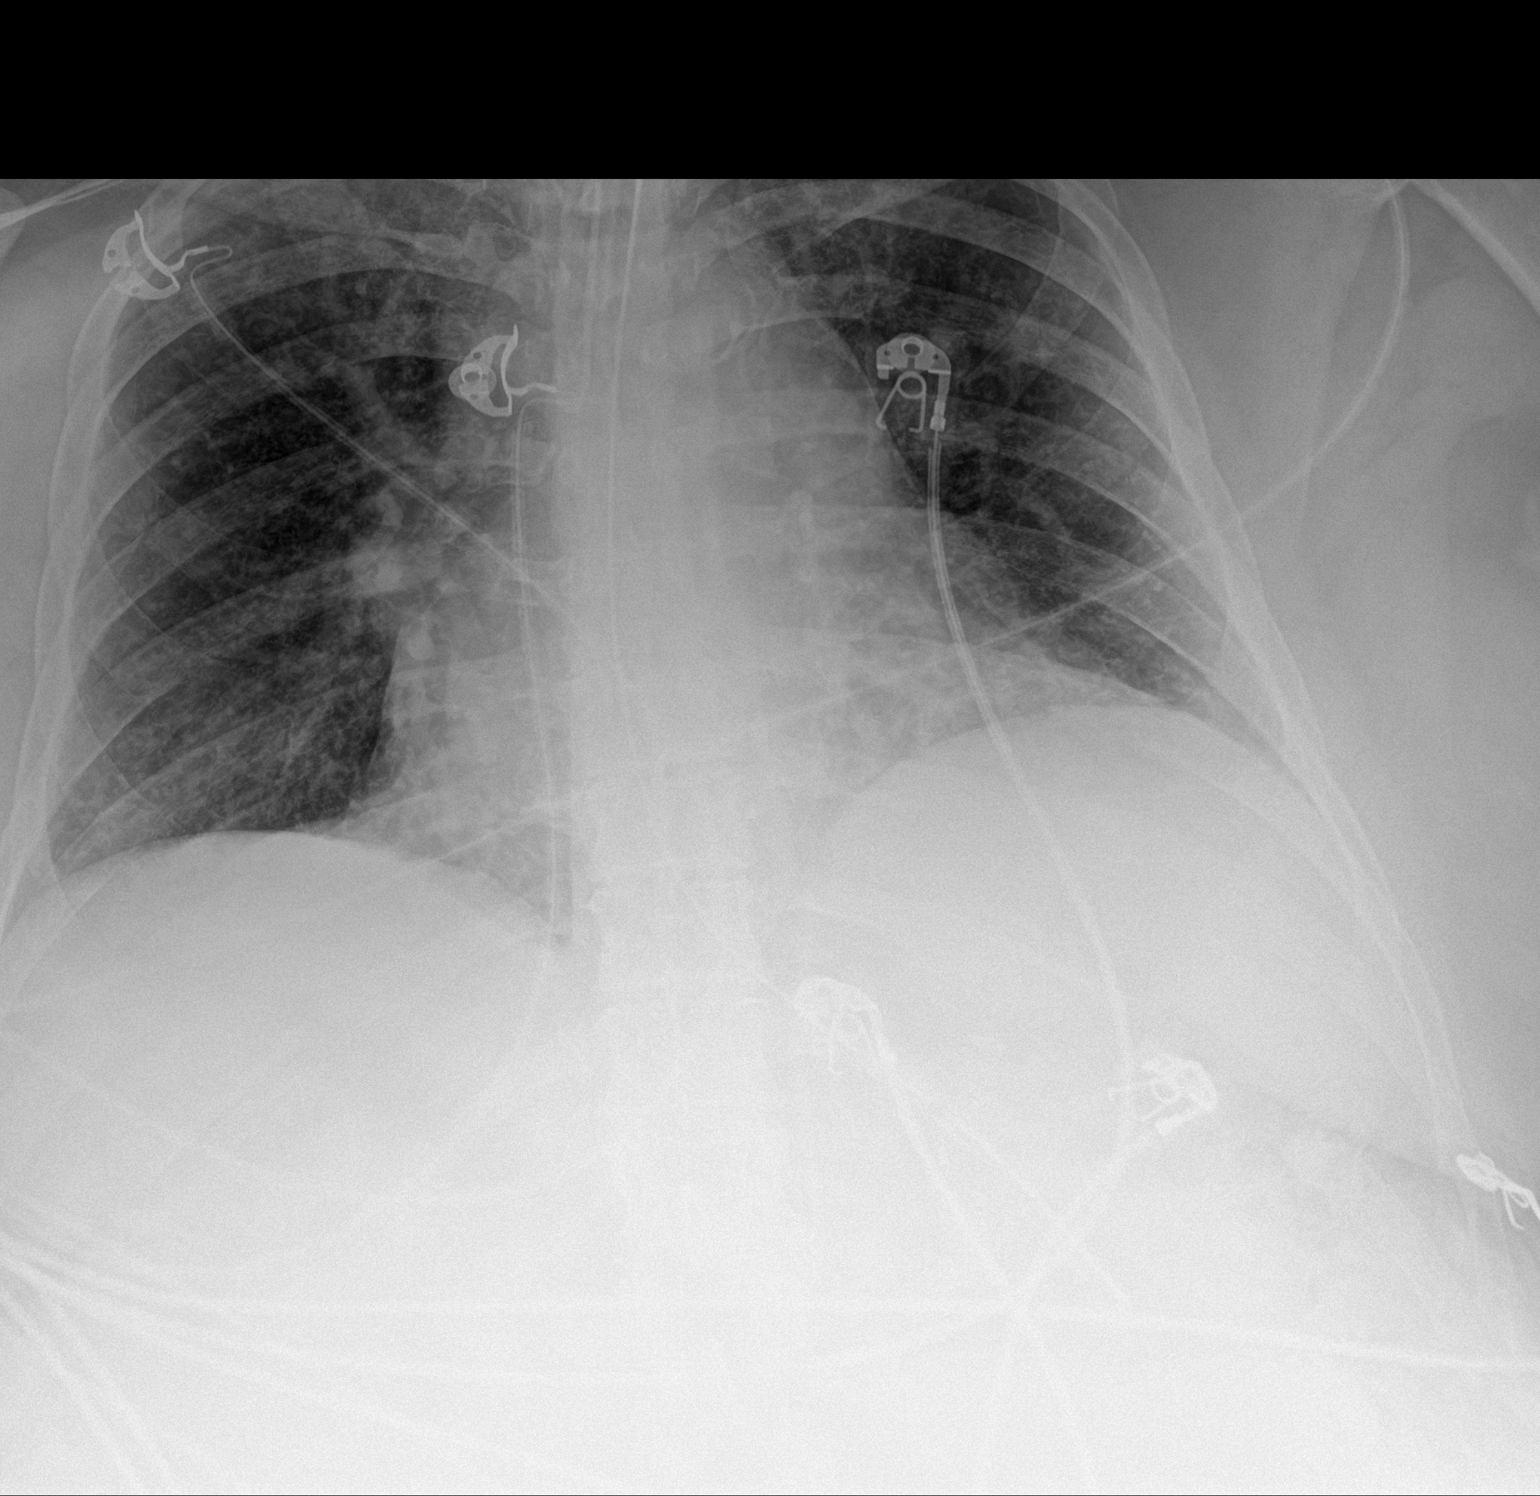

[1 of 1 positions shown; findings below may reference images not displayed]

FINDINGS: The patient's endotracheal tube appears to end at the carina. This
should be retracted 3 cm. The patient's enteric tube is seen ending
overlying the body of the stomach.

There is mild elevation of the left hemidiaphragm. Mild vascular
congestion is noted. Mild bibasilar atelectasis seen. No pleural
effusion or pneumothorax is seen.

The cardiomediastinal silhouette is mildly enlarged. No acute
osseous abnormalities are identified.
IMPRESSION: 1. Endotracheal tube appears to end at the carina. This should be
retracted 3 cm.
2. Enteric tube noted ending overlying the body of the stomach.
3. Mild elevation of the left hemidiaphragm. Mild bibasilar
atelectasis seen.
4. Mild vascular congestion and mild cardiomegaly noted.

These results were called by telephone at the time of interpretation
on 04/06/2017 at [DATE] to Dr. YENI SCHENCK, who verbally
acknowledged these results.

## 2018-01-08 ENCOUNTER — Other Ambulatory Visit: Payer: Self-pay | Admitting: Internal Medicine

## 2018-01-08 DIAGNOSIS — Z139 Encounter for screening, unspecified: Secondary | ICD-10-CM

## 2018-02-19 ENCOUNTER — Ambulatory Visit: Payer: Medicare Other

## 2018-02-26 IMAGING — DX DG FOOT COMPLETE 3+V*R*
3 series · 3 of 3 positions shown · non-contrast
Comparison: Radiographs dated 04/06/2017 and 03/21/2017

CLINICAL DATA: Nonhealing wound of the left foot. Status post
amputation of the second toe and distal second metatarsal.

EXAM:
RIGHT FOOT COMPLETE - 3+ VIEW

[foot ap]
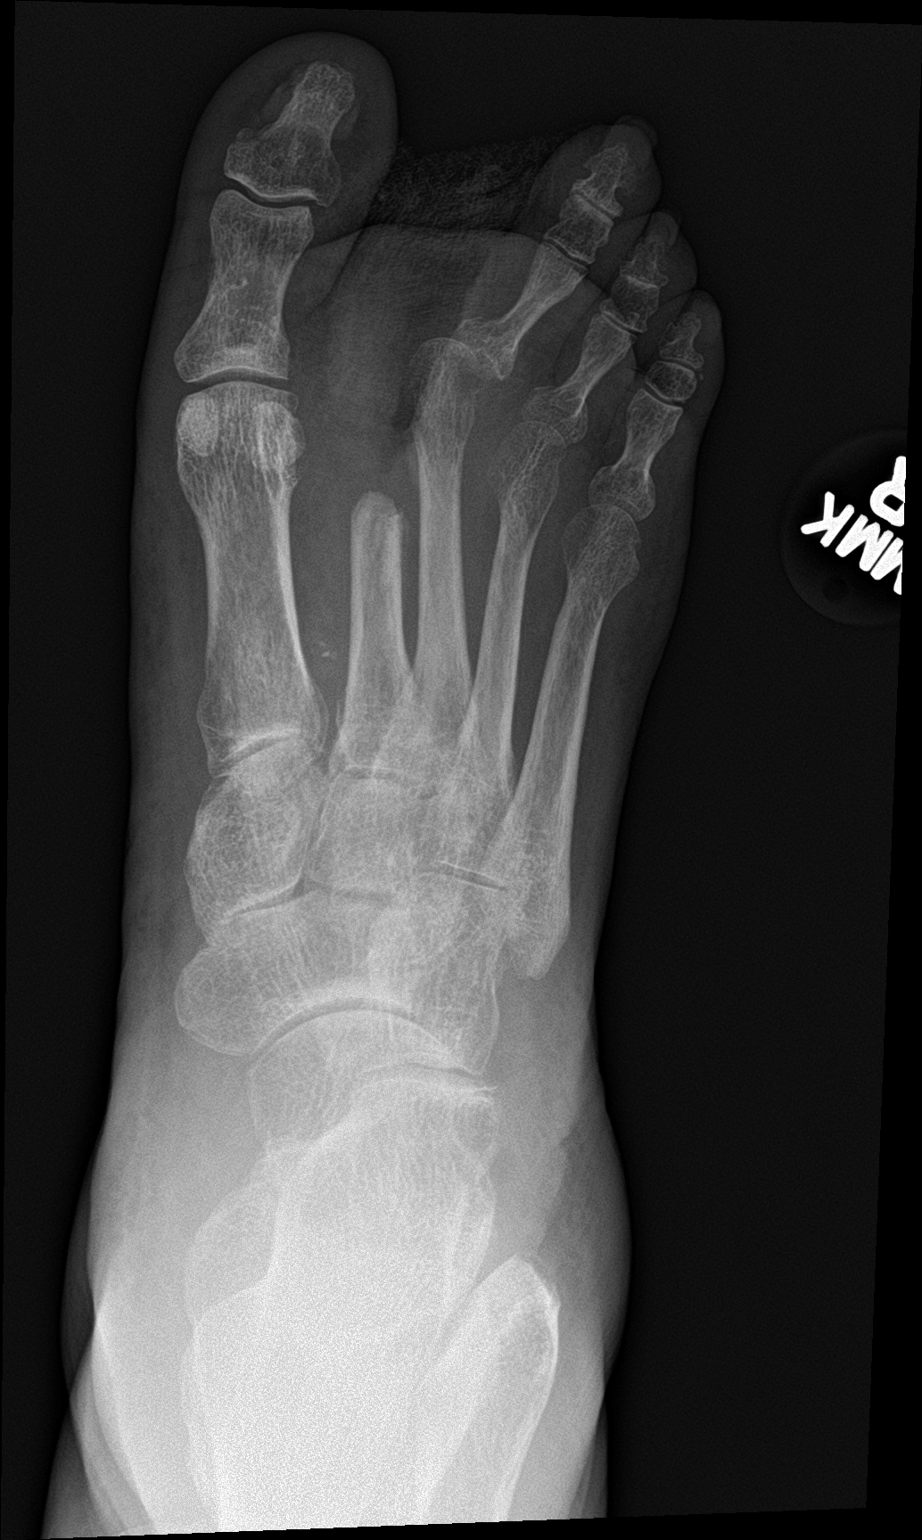

[foot obl]
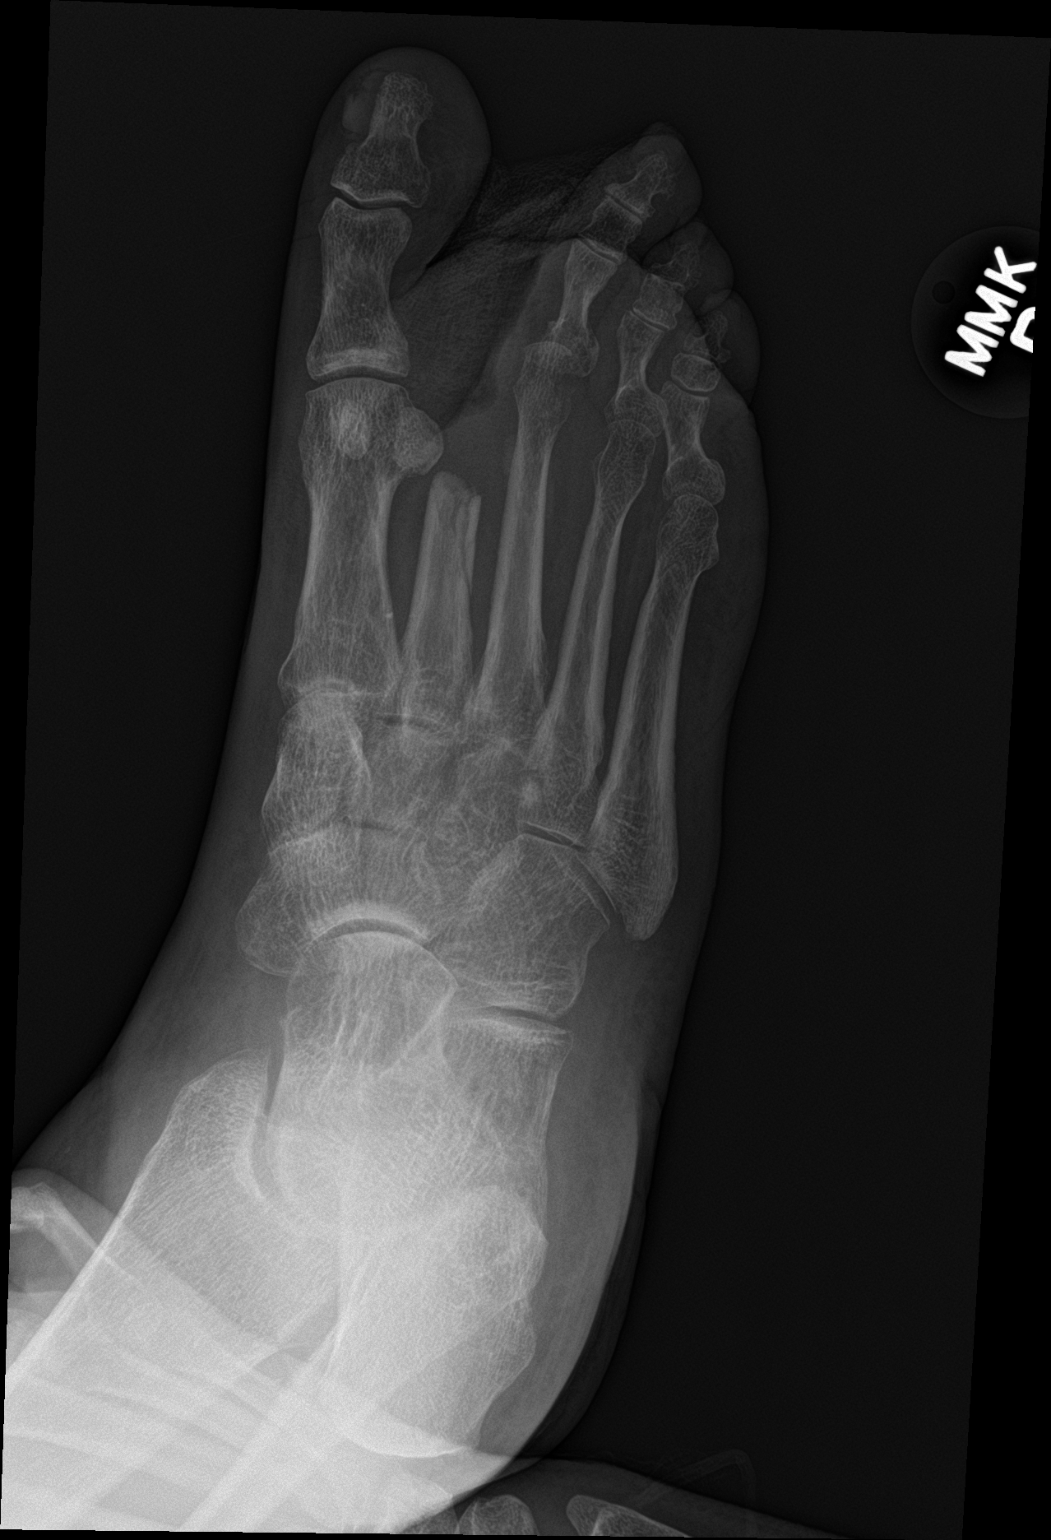

[foot lat]
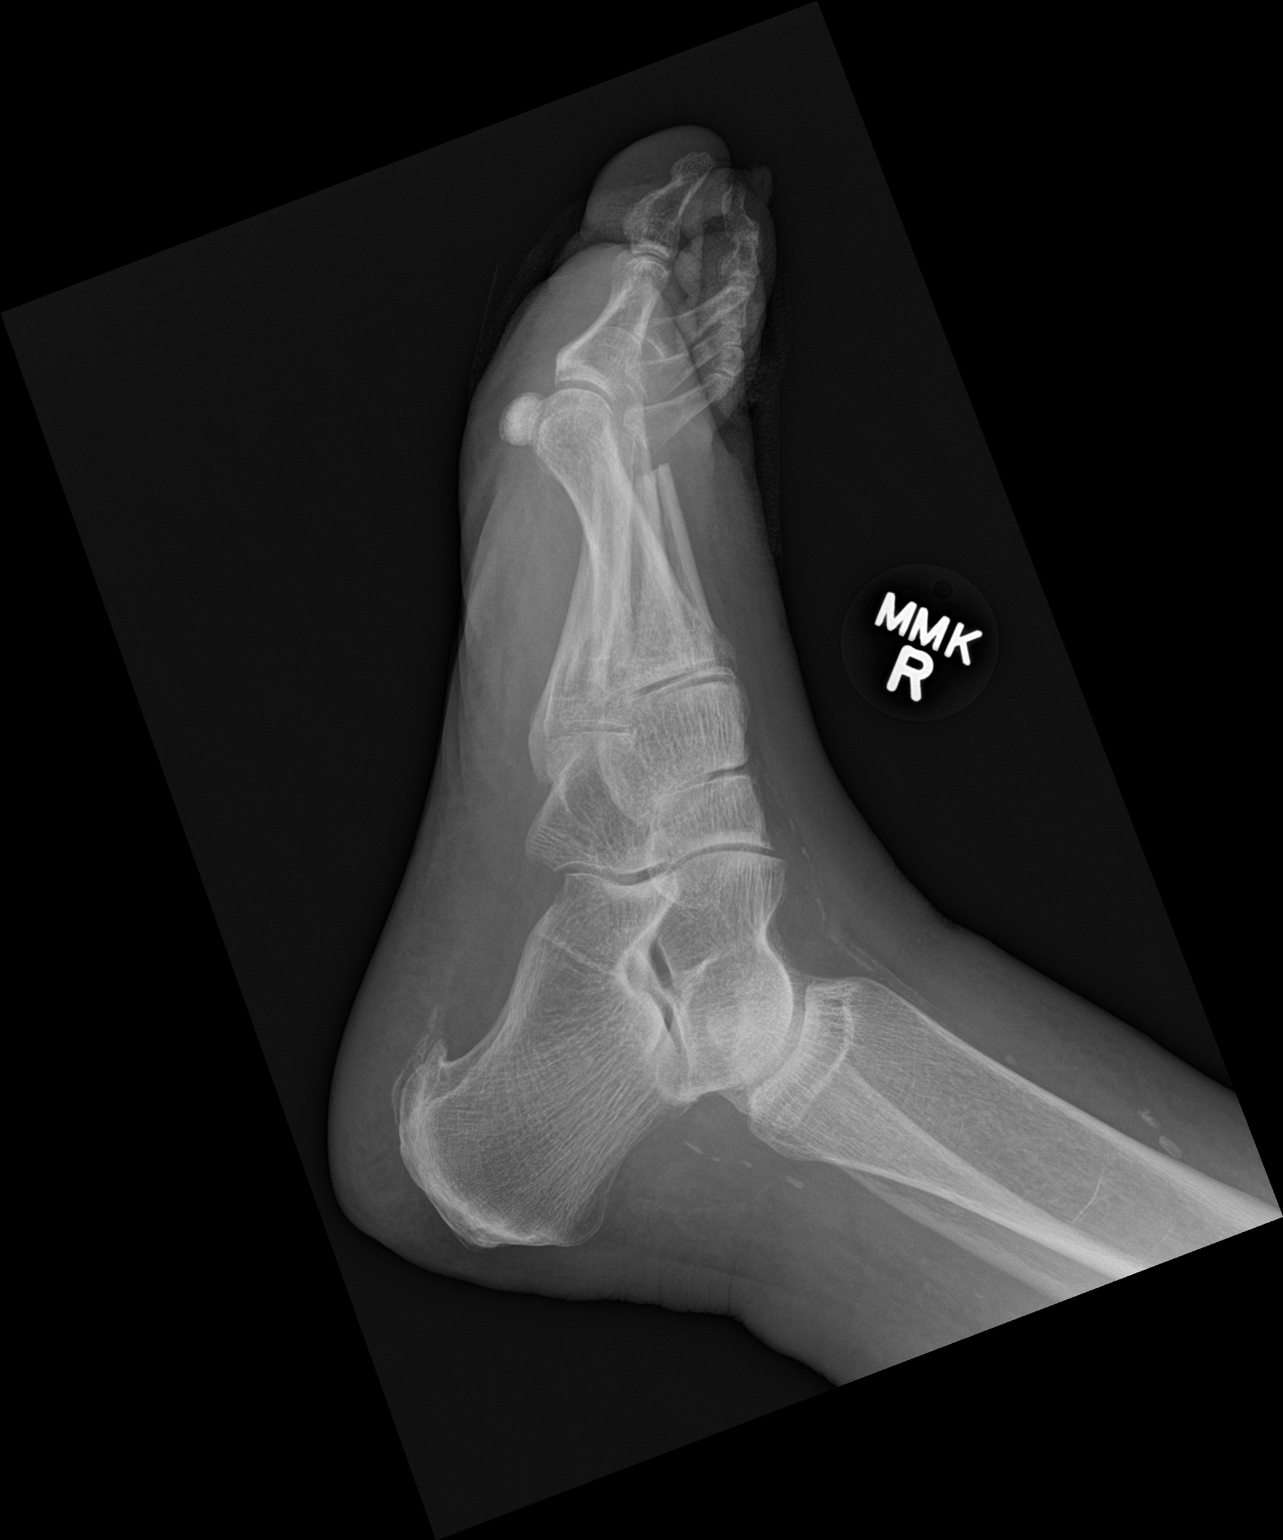

[3 of 3 positions shown; findings below may reference images not displayed]

FINDINGS: There is a fracture of the stump of the second metatarsal. There is
some periosteal reaction. There is new subluxation at the third and
MTP joint into lucency in the head of the third metatarsal with
slight irregularity of the base of the proximal phalanx of the third
toe.

Slight chronic degenerative changes of the first MTP joint.
IMPRESSION: 1. Fracture of the stump of the second metatarsal.
2. New subluxation at the third MTP joint.
3. Slight irregularity of the cortex of the base of the proximal
phalanx of the third toe which could represent osteomyelitis.

## 2018-02-26 IMAGING — DX DG CHEST 2V
2 series · 2 of 2 positions shown · non-contrast
Comparison: 04/07/2017

CLINICAL DATA: Fever

EXAM:
CHEST  2 VIEW

[chest pa]
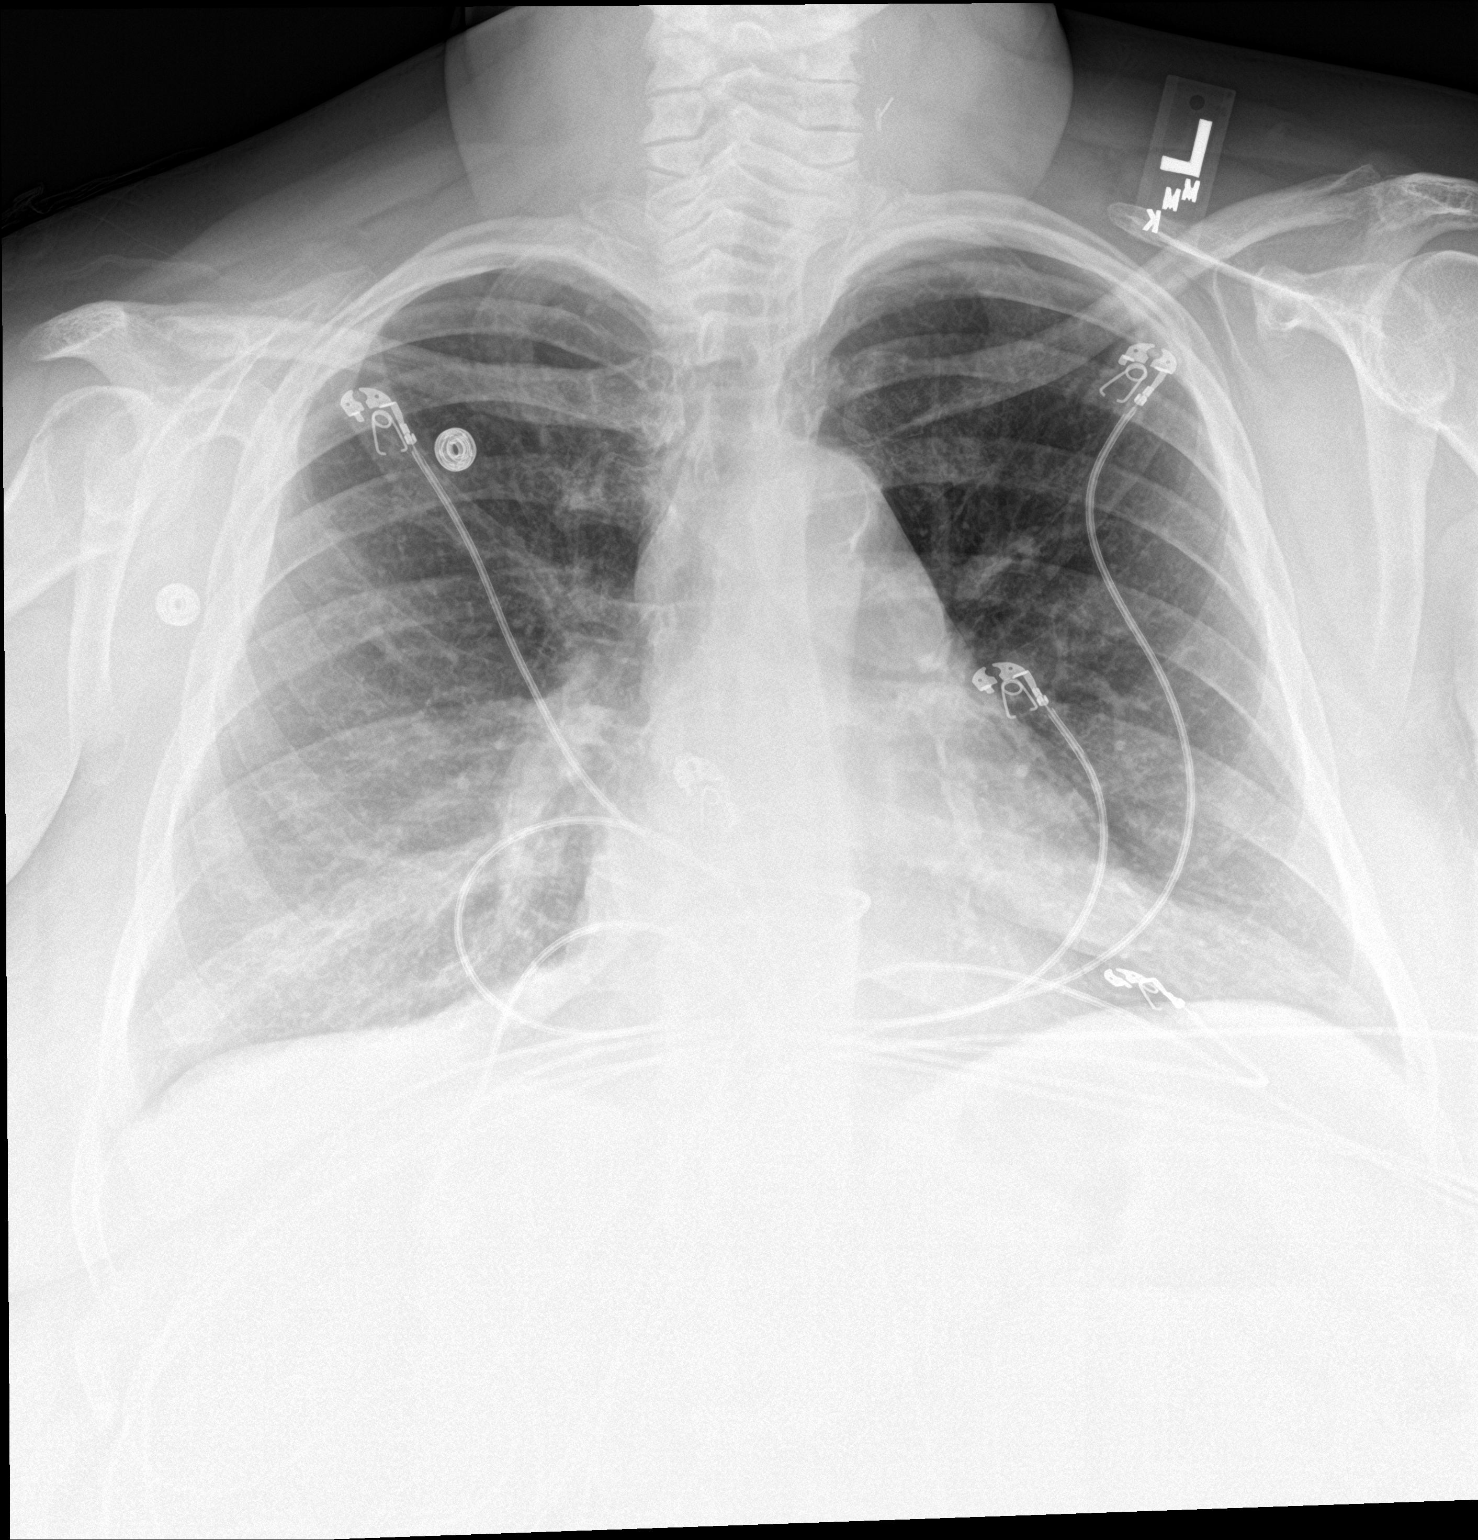

[chest lat]
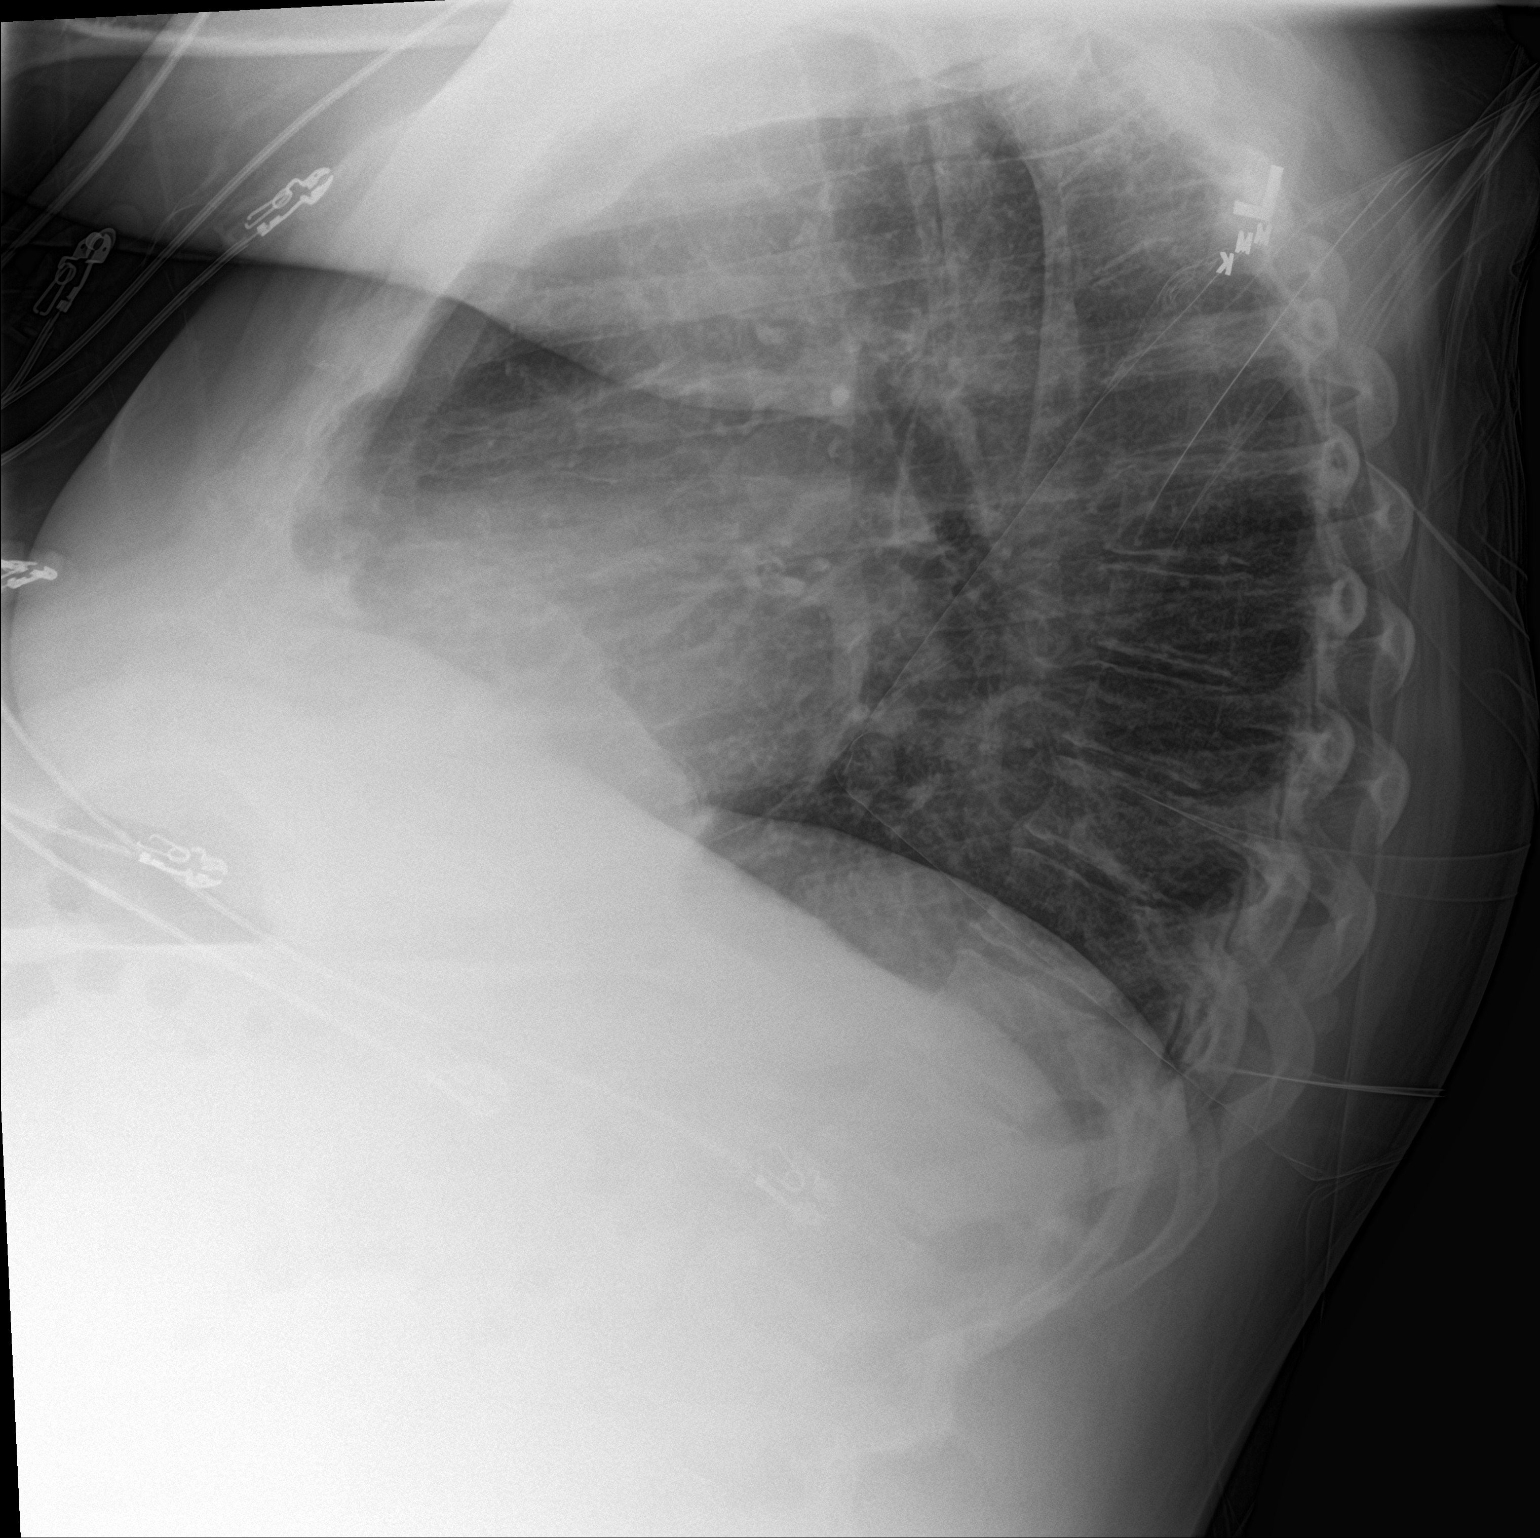

[2 of 2 positions shown; findings below may reference images not displayed]

FINDINGS: Heart and mediastinal contours are within normal limits. No focal
opacities or effusions. No acute bony abnormality.
IMPRESSION: No active cardiopulmonary disease.

## 2018-02-26 IMAGING — MR MR FOOT*R* W/O CM
4 of 5 series · 19 of 40 positions shown · non-contrast
Comparison: Right foot radiographs obtained earlier today and on
04/06/2017.

CLINICAL DATA: Status post amputation of the right second toe and
distal metatarsal on 04/28/2017 with purulent drainage from the
surgical wound. Clinical evidence of sepsis. Findings concerning for
possible osteomyelitis involving the base of the third proximal
phalanx on right foot radiographs obtained earlier today. There is
also a fracture of the stump of the right second metatarsal on the
radiographs. Diabetes.

EXAM:
MRI OF THE RIGHT FOREFOOT WITHOUT CONTRAST
TECHNIQUE: Multiplanar, multisequence MR imaging of the right foot was
performed. No intravenous contrast was administered.

[Series 4: T1 · axial · 3.0mm · 0.29mm/px · z∈[-44,+19]mm · 5 of 24 slices shown (1 of 2)]
[im 1/24]
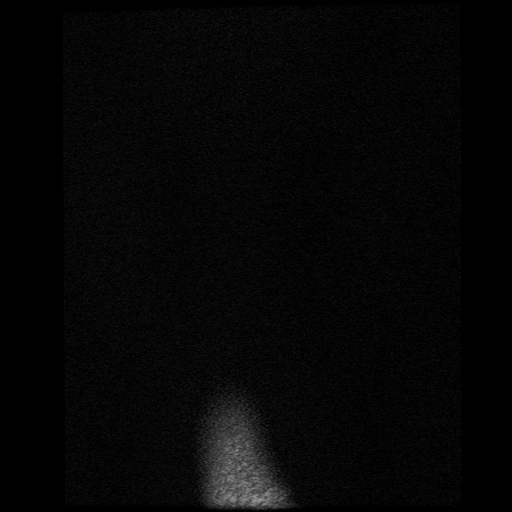
[im 4/24]
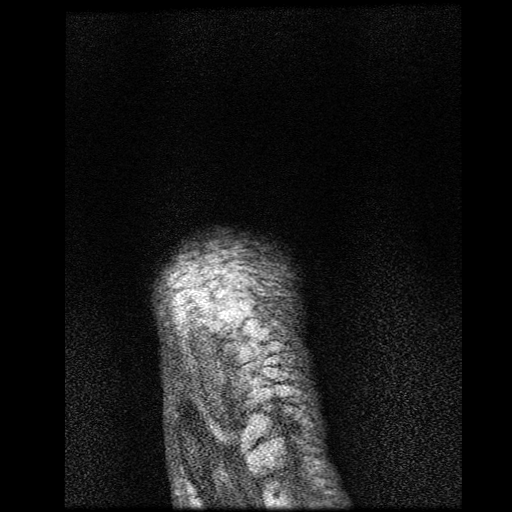
[im 8/24]
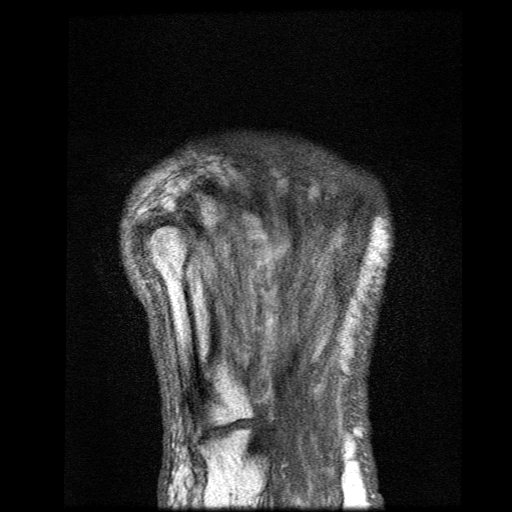
[im 12/24]
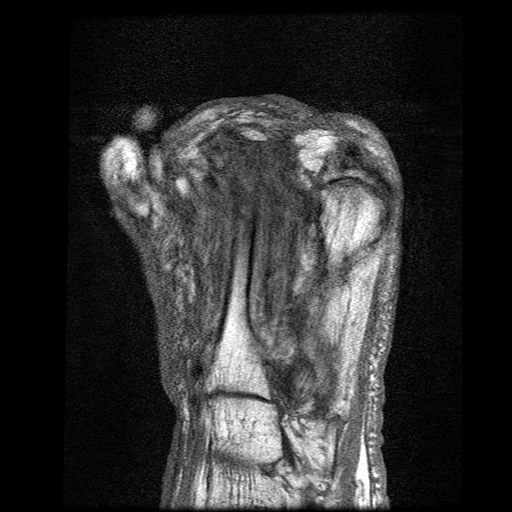
[im 20/24]
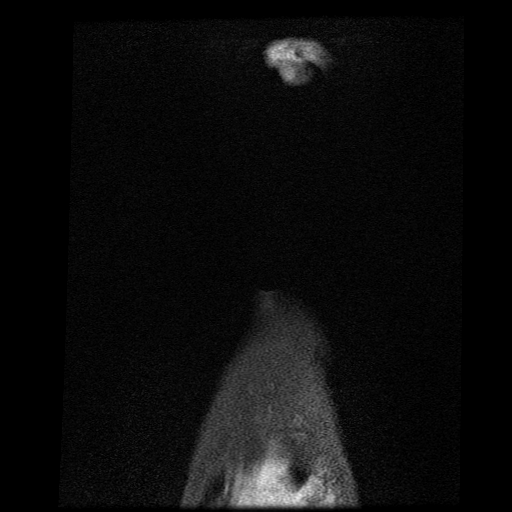

[Series 8: STIR · sagittal · 4.0mm · 0.29mm/px · 3 of 20 slices shown]
[im 4/20]
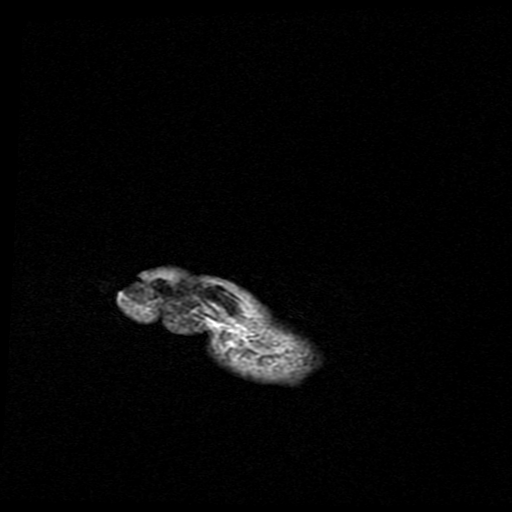
[im 10/20]
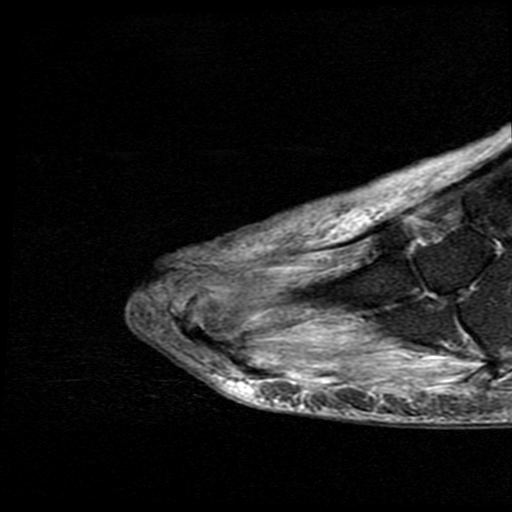
[im 16/20]
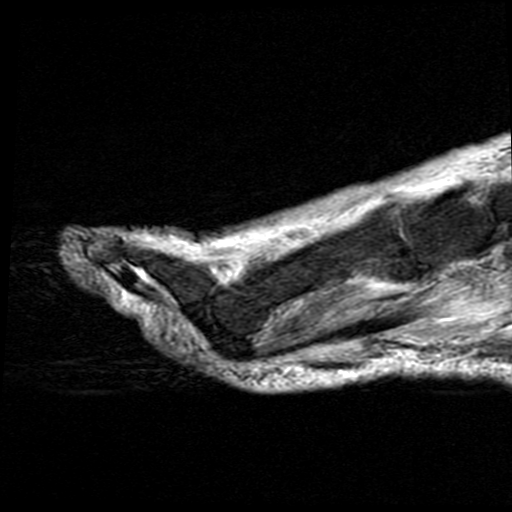

[Series 10: T1 · coronal · 4.0mm · 0.31mm/px · 3 of 27 slices shown (2 of 2)]
[im 4/27]
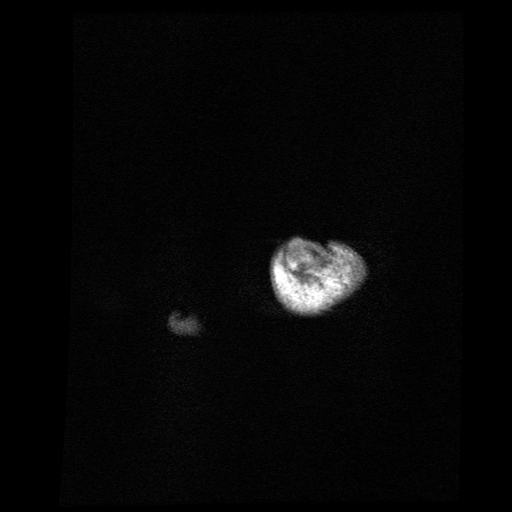
[im 14/27]
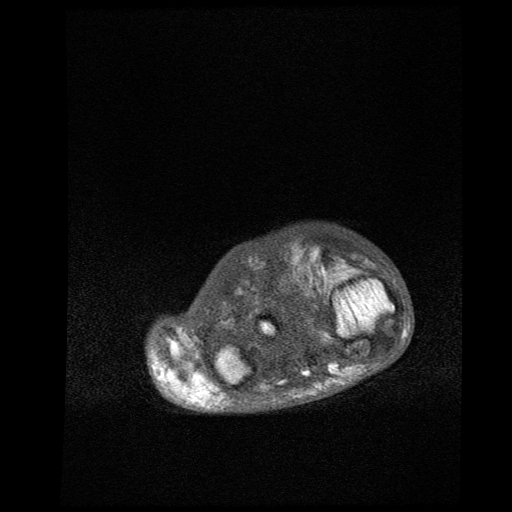
[im 23/27]
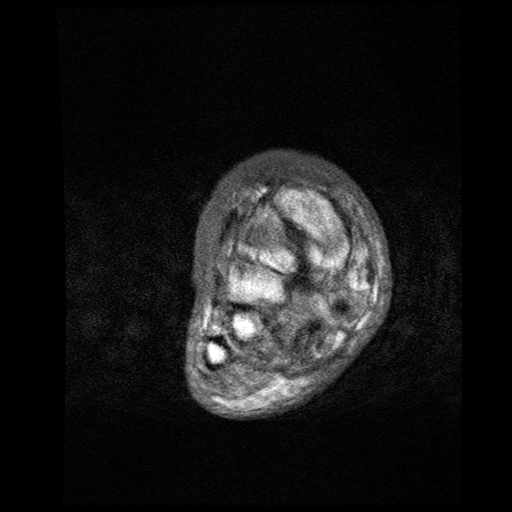

[Series 11: T2 fat-sat · axial · 3.0mm · 0.31mm/px · z∈[-47,+30]mm · 8 of 24 slices shown]
[im 1/24]
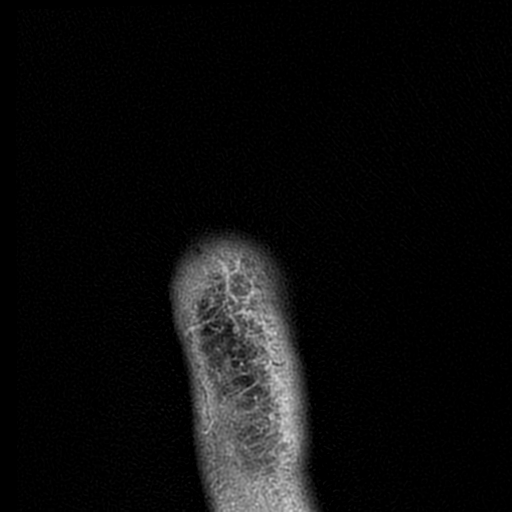
[im 4/24]
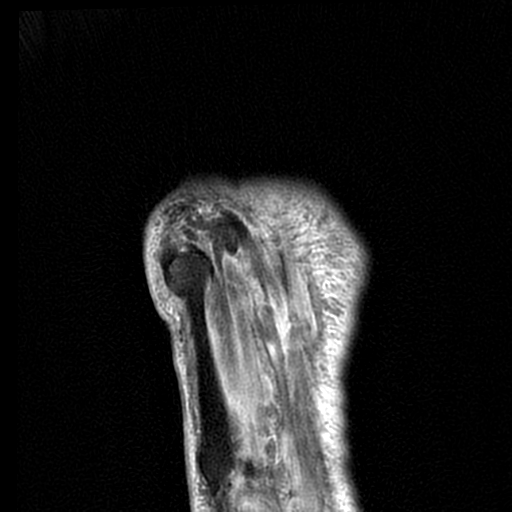
[im 7/24]
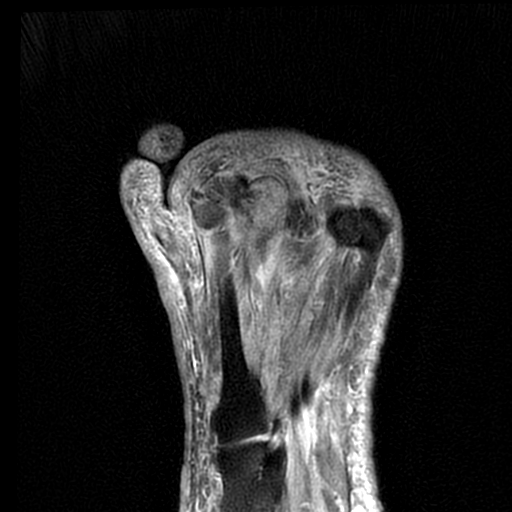
[im 10/24]
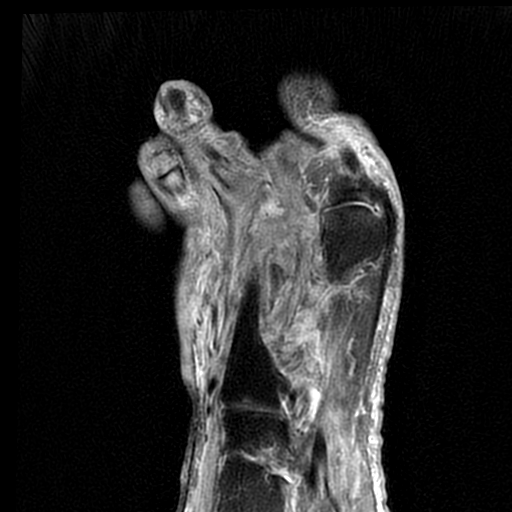
[im 14/24]
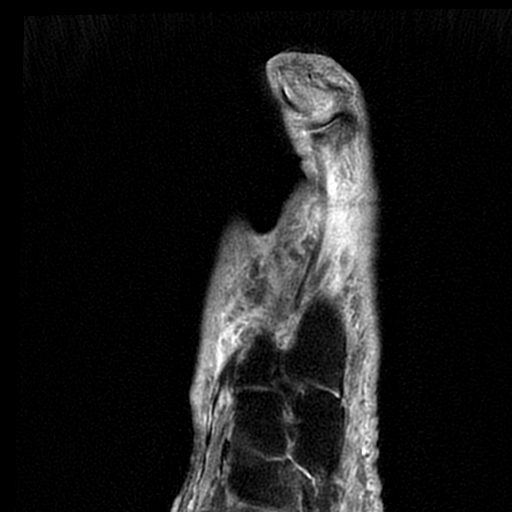
[im 17/24]
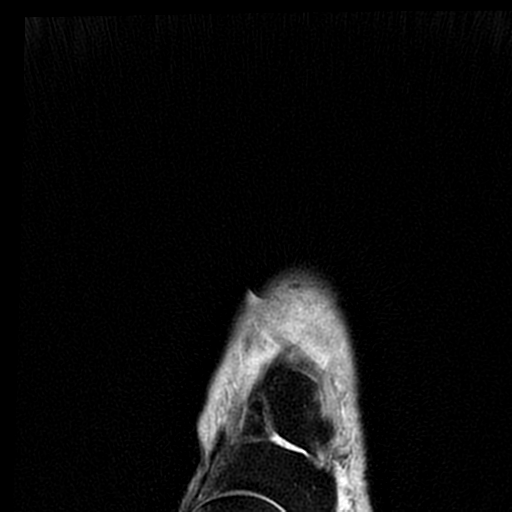
[im 20/24]
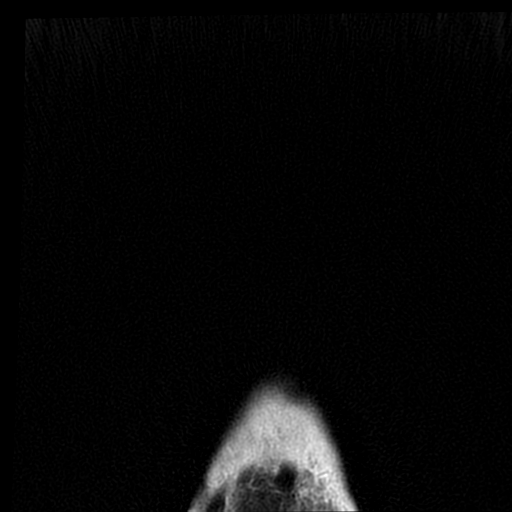
[im 24/24]
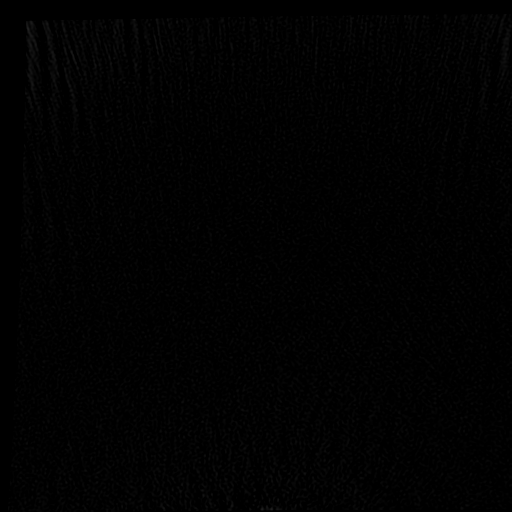

[19 of 40 positions shown; findings below may reference images not displayed]

FINDINGS: Bones/Joint/Cartilage

Limited by artifacts produced by patient motion and lack of
intravenous contrast. There is edema in the distal aspect of the
second metatarsal stump at the location of the recently demonstrated
fracture. There is also edema in the third metatarsal head an neck
with some focal lucency in the lateral aspect of the third
metatarsal head on the recent radiographs. There is also mild edema
in the laterally subluxed third proximal phalanx.

Ligaments

Not well assessed.

Muscles and Tendons

Diffuse muscular edema.

Soft tissues

Diffuse edema.
IMPRESSION: 1. Limited examination by patient motion and lack of intravenous
contrast.
2. Findings suspicious for osteomyelitis involving the third
metatarsal head and neck and possibly involving the proximal aspect
of the third proximal phalanx.
3. Edema in the distal aspect of the second metatarsal stump. This
is compatible with edema associated with the fracture at that
location.
4. Diffuse soft tissue edema.

## 2018-03-04 IMAGING — US US RENAL
1 series · 14 of 25 positions shown · non-contrast
Comparison: CT abdomen and pelvis 07/04/2015

CLINICAL DATA: Acute on chronic renal failure.

EXAM:
RENAL / URINARY TRACT ULTRASOUND COMPLETE

[Series 1: us renal · 0.28mm/px · 14 of 33 slices shown]
[im 1/33]
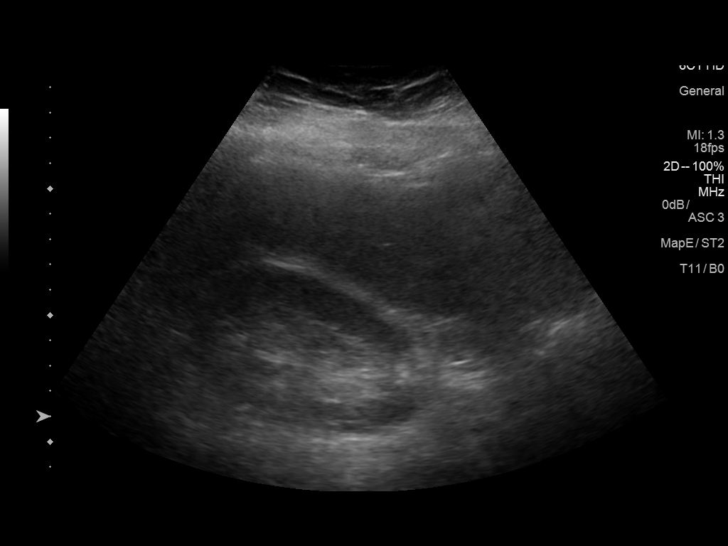
[im 3/33]
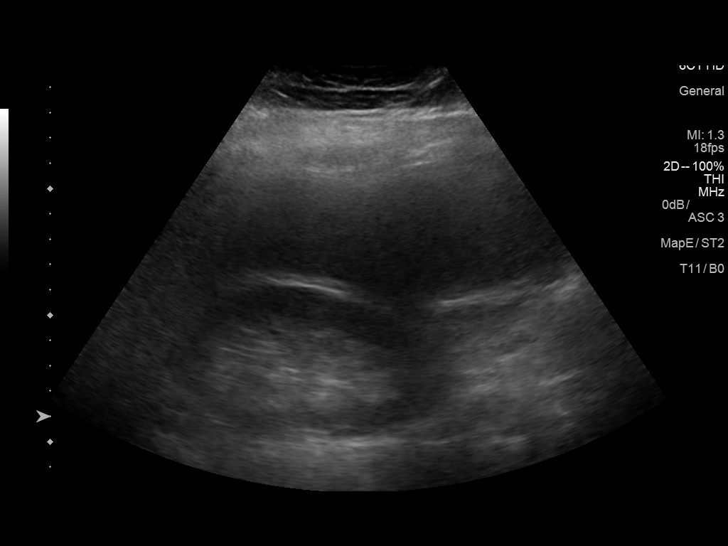
[im 6/33]
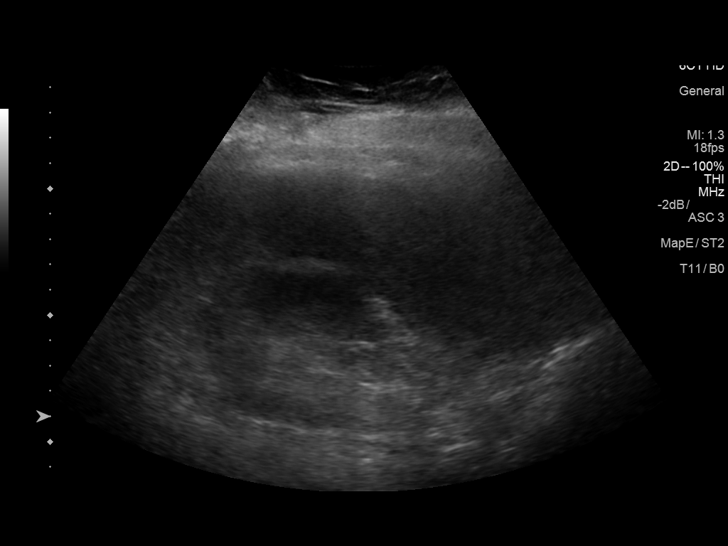
[im 9/33]
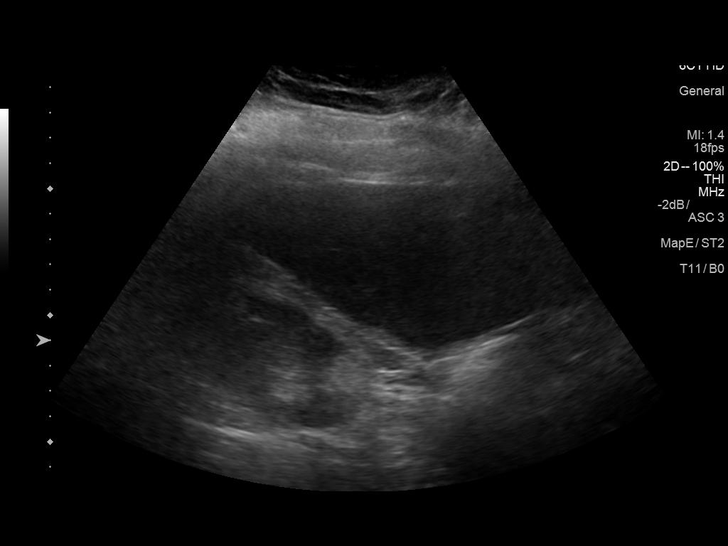
[im 11/33]
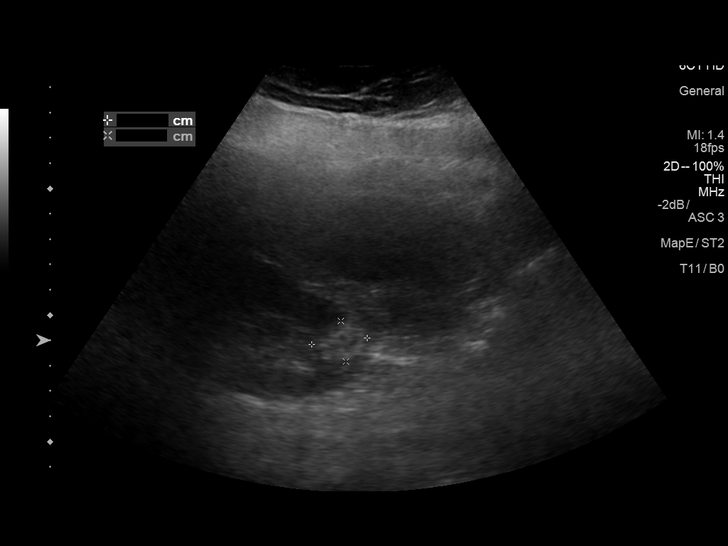
[im 13/33]
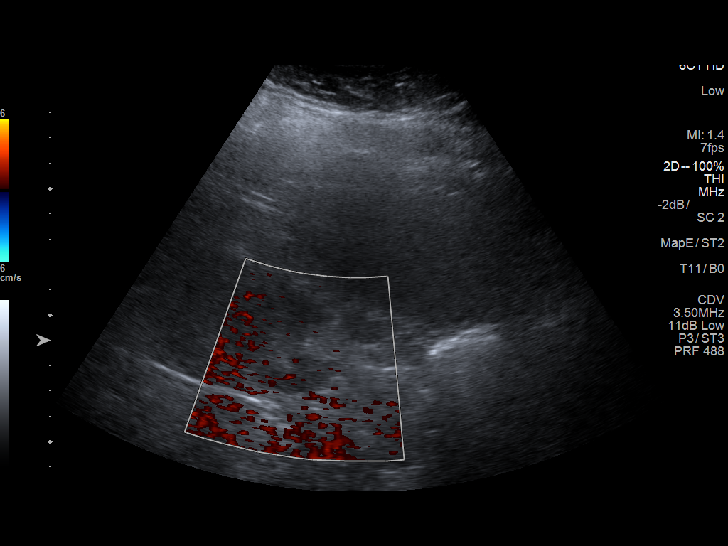
[im 15/33]
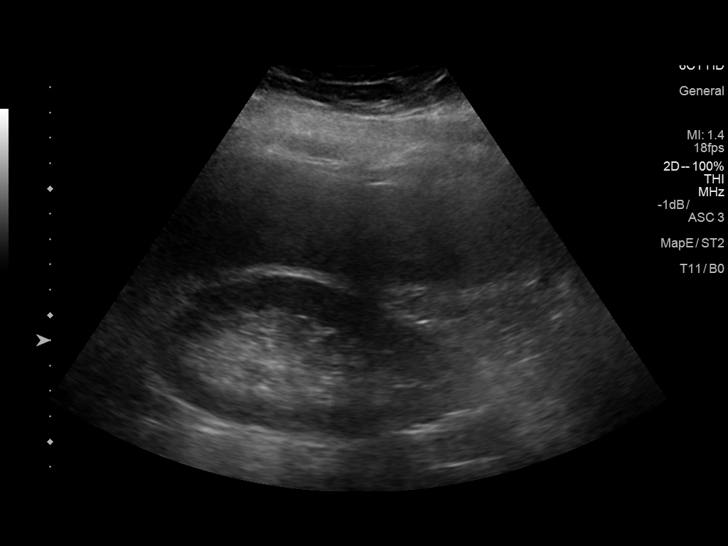
[im 18/33]
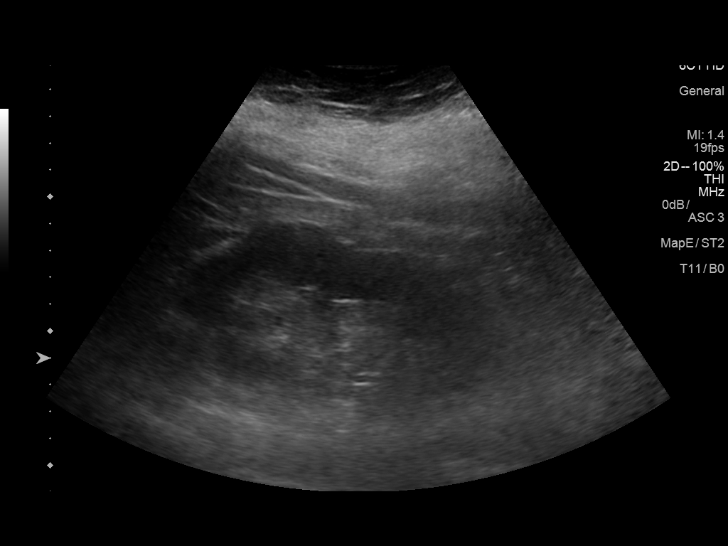
[im 21/33]
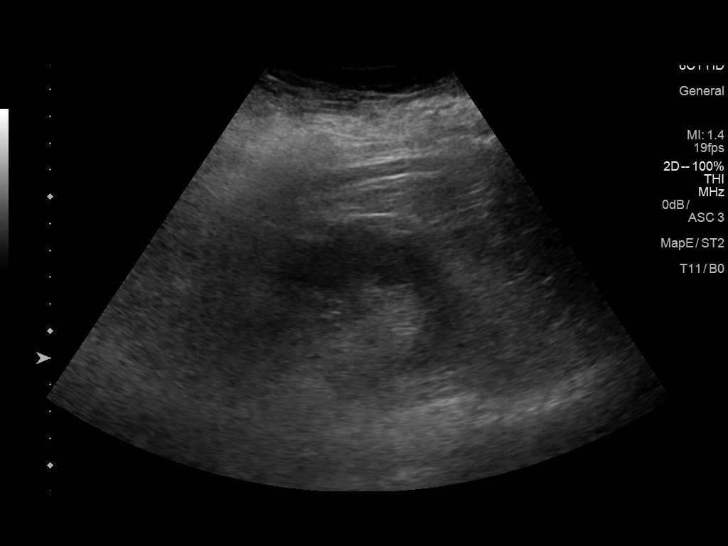
[im 22/33]
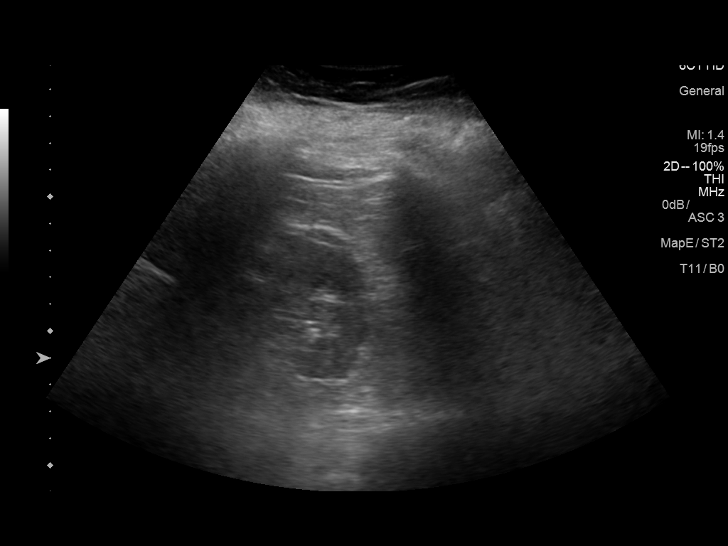
[im 25/33]
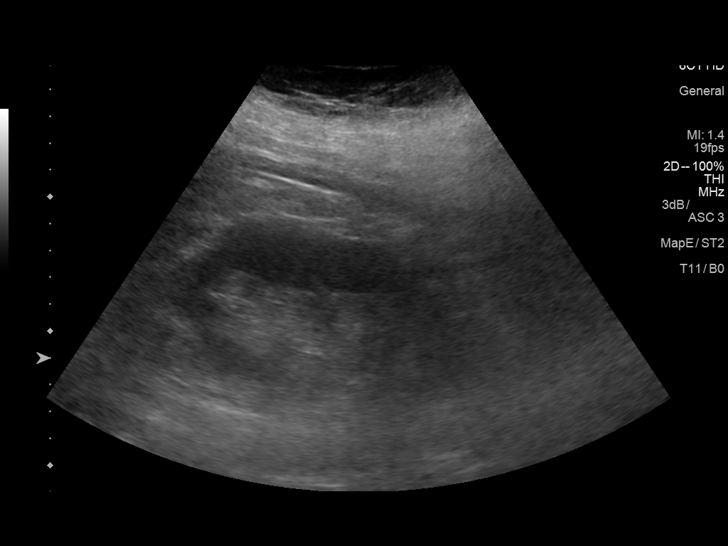
[im 27/33]
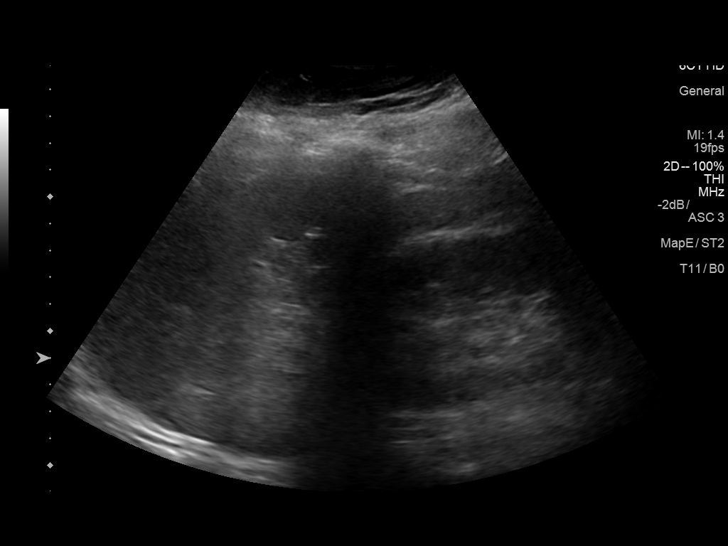
[im 30/33]
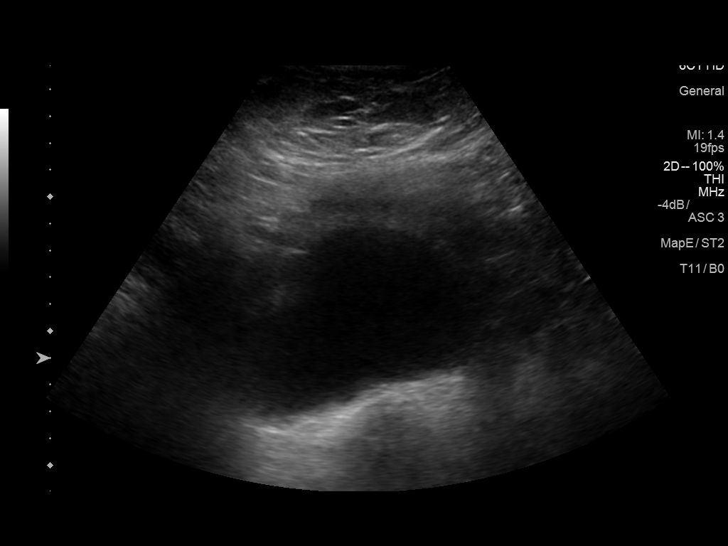
[im 33/33]
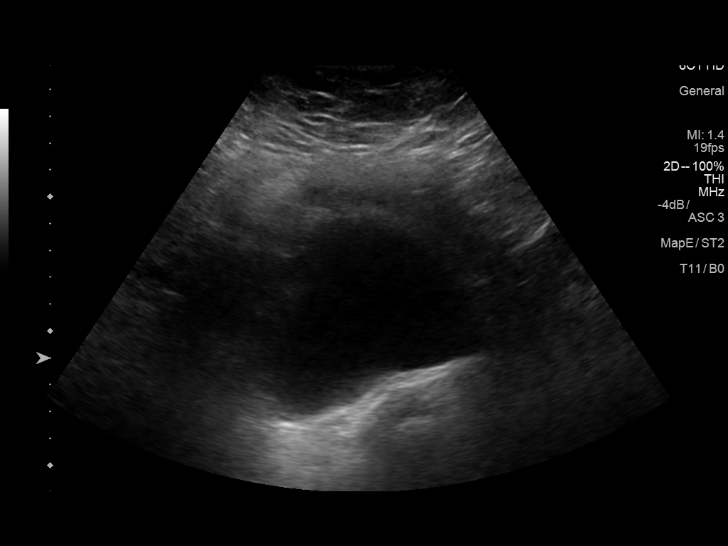

[14 of 25 positions shown; findings below may reference images not displayed]

FINDINGS: Right Kidney:

Length: 13.0 cm. Echogenic parenchyma with mild cortical thinning.
2.2 x 1.6 x 2.0 cm hyperechoic lesion in the lower pole
corresponding to the fact containing lesion on prior CT. No
hydronephrosis.

Left Kidney:

Length: 12.4 cm. Increased parenchymal echogenicity. No mass or
hydronephrosis visualized.

Bladder:

Appears normal for degree of bladder distention.
IMPRESSION: 1. Echogenic kidneys compatible with medical renal disease. No
hydronephrosis.
2. 2.2 cm right renal angiomyolipoma.

## 2018-03-07 ENCOUNTER — Other Ambulatory Visit: Payer: Self-pay | Admitting: Internal Medicine

## 2018-03-07 ENCOUNTER — Other Ambulatory Visit (HOSPITAL_COMMUNITY): Payer: Self-pay | Admitting: Cardiology

## 2018-03-07 DIAGNOSIS — I480 Paroxysmal atrial fibrillation: Secondary | ICD-10-CM

## 2018-03-09 ENCOUNTER — Ambulatory Visit
Admission: RE | Admit: 2018-03-09 | Discharge: 2018-03-09 | Disposition: A | Discharge status: Home / Self Care | Payer: Medicare HMO | Source: Ambulatory Visit | Attending: Internal Medicine | Admitting: Internal Medicine

## 2018-03-09 DIAGNOSIS — Z139 Encounter for screening, unspecified: Secondary | ICD-10-CM

## 2018-03-27 ENCOUNTER — Other Ambulatory Visit: Payer: Self-pay

## 2018-03-27 ENCOUNTER — Encounter (HOSPITAL_COMMUNITY): Payer: Self-pay | Admitting: Cardiology

## 2018-03-27 ENCOUNTER — Ambulatory Visit (HOSPITAL_COMMUNITY)
Admission: RE | Admit: 2018-03-27 | Discharge: 2018-03-27 | Disposition: A | Discharge status: Home / Self Care | Payer: Medicare HMO | Source: Ambulatory Visit | Attending: Cardiology | Admitting: Cardiology

## 2018-03-27 VITALS — BP 155/68 | HR 85 | Wt 256.0 lb

## 2018-03-27 DIAGNOSIS — N183 Chronic kidney disease, stage 3 (moderate): Secondary | ICD-10-CM | POA: Insufficient documentation

## 2018-03-27 DIAGNOSIS — R001 Bradycardia, unspecified: Secondary | ICD-10-CM | POA: Insufficient documentation

## 2018-03-27 DIAGNOSIS — E039 Hypothyroidism, unspecified: Secondary | ICD-10-CM | POA: Diagnosis not present

## 2018-03-27 DIAGNOSIS — Z7901 Long term (current) use of anticoagulants: Secondary | ICD-10-CM | POA: Insufficient documentation

## 2018-03-27 DIAGNOSIS — Z79899 Other long term (current) drug therapy: Secondary | ICD-10-CM | POA: Insufficient documentation

## 2018-03-27 DIAGNOSIS — I2510 Atherosclerotic heart disease of native coronary artery without angina pectoris: Secondary | ICD-10-CM | POA: Diagnosis not present

## 2018-03-27 DIAGNOSIS — Z87891 Personal history of nicotine dependence: Secondary | ICD-10-CM | POA: Diagnosis not present

## 2018-03-27 DIAGNOSIS — E1122 Type 2 diabetes mellitus with diabetic chronic kidney disease: Secondary | ICD-10-CM | POA: Insufficient documentation

## 2018-03-27 DIAGNOSIS — Z8249 Family history of ischemic heart disease and other diseases of the circulatory system: Secondary | ICD-10-CM | POA: Diagnosis not present

## 2018-03-27 DIAGNOSIS — I10 Essential (primary) hypertension: Secondary | ICD-10-CM

## 2018-03-27 DIAGNOSIS — J449 Chronic obstructive pulmonary disease, unspecified: Secondary | ICD-10-CM | POA: Insufficient documentation

## 2018-03-27 DIAGNOSIS — I5022 Chronic systolic (congestive) heart failure: Secondary | ICD-10-CM | POA: Diagnosis not present

## 2018-03-27 DIAGNOSIS — Z794 Long term (current) use of insulin: Secondary | ICD-10-CM | POA: Insufficient documentation

## 2018-03-27 DIAGNOSIS — E1151 Type 2 diabetes mellitus with diabetic peripheral angiopathy without gangrene: Secondary | ICD-10-CM | POA: Insufficient documentation

## 2018-03-27 DIAGNOSIS — E785 Hyperlipidemia, unspecified: Secondary | ICD-10-CM | POA: Diagnosis not present

## 2018-03-27 DIAGNOSIS — Z9862 Peripheral vascular angioplasty status: Secondary | ICD-10-CM | POA: Diagnosis not present

## 2018-03-27 DIAGNOSIS — Z09 Encounter for follow-up examination after completed treatment for conditions other than malignant neoplasm: Secondary | ICD-10-CM | POA: Diagnosis not present

## 2018-03-27 DIAGNOSIS — I480 Paroxysmal atrial fibrillation: Secondary | ICD-10-CM | POA: Insufficient documentation

## 2018-03-27 DIAGNOSIS — I130 Hypertensive heart and chronic kidney disease with heart failure and stage 1 through stage 4 chronic kidney disease, or unspecified chronic kidney disease: Secondary | ICD-10-CM | POA: Insufficient documentation

## 2018-03-27 DIAGNOSIS — G4733 Obstructive sleep apnea (adult) (pediatric): Secondary | ICD-10-CM | POA: Insufficient documentation

## 2018-03-27 DIAGNOSIS — Z9889 Other specified postprocedural states: Secondary | ICD-10-CM | POA: Diagnosis not present

## 2018-03-27 DIAGNOSIS — K7460 Unspecified cirrhosis of liver: Secondary | ICD-10-CM | POA: Insufficient documentation

## 2018-03-27 DIAGNOSIS — I739 Peripheral vascular disease, unspecified: Secondary | ICD-10-CM

## 2018-03-27 LAB — COMPREHENSIVE METABOLIC PANEL
ALBUMIN: 3.2 g/dL — AB (ref 3.5–5.0)
ALT: 23 U/L (ref 14–54)
ANION GAP: 11 (ref 5–15)
AST: 19 U/L (ref 15–41)
Alkaline Phosphatase: 140 U/L — ABNORMAL HIGH (ref 38–126)
BUN: 31 mg/dL — ABNORMAL HIGH (ref 6–20)
CALCIUM: 9.3 mg/dL (ref 8.9–10.3)
CHLORIDE: 96 mmol/L — AB (ref 101–111)
CO2: 33 mmol/L — ABNORMAL HIGH (ref 22–32)
Creatinine, Ser: 1.29 mg/dL — ABNORMAL HIGH (ref 0.44–1.00)
GFR calc Af Amer: 53 mL/min — ABNORMAL LOW (ref >60)
GFR, EST NON AFRICAN AMERICAN: 46 mL/min — AB (ref >60)
GLUCOSE: 231 mg/dL — AB (ref 65–99)
POTASSIUM: 4.6 mmol/L (ref 3.5–5.1)
Sodium: 140 mmol/L (ref 135–145)
Total Bilirubin: 0.4 mg/dL (ref 0.3–1.2)
Total Protein: 7.4 g/dL (ref 6.5–8.1)

## 2018-03-27 LAB — CBC
HEMATOCRIT: 37.3 % (ref 36.0–46.0)
Hemoglobin: 11.7 g/dL — ABNORMAL LOW (ref 12.0–15.0)
MCH: 29.3 pg (ref 26.0–34.0)
MCHC: 31.4 g/dL (ref 30.0–36.0)
MCV: 93.5 fL (ref 78.0–100.0)
PLATELETS: 317 10*3/uL (ref 150–400)
RBC: 3.99 MIL/uL (ref 3.87–5.11)
RDW: 14.1 % (ref 11.5–15.5)
WBC: 14.6 10*3/uL — ABNORMAL HIGH (ref 4.0–10.5)

## 2018-03-27 LAB — LIPID PANEL
CHOL/HDL RATIO: 3.7 ratio
Cholesterol: 146 mg/dL (ref 0–200)
HDL: 39 mg/dL — AB (ref >40)
LDL CALC: 63 mg/dL (ref 0–99)
TRIGLYCERIDES: 222 mg/dL — AB (ref <150)
VLDL: 44 mg/dL — ABNORMAL HIGH (ref 0–40)

## 2018-03-27 LAB — TSH: TSH: 3.019 u[IU]/mL (ref 0.350–4.500)

## 2018-03-27 MED ORDER — POTASSIUM CHLORIDE CRYS ER 20 MEQ PO TBCR: 20.0000 meq | EXTENDED_RELEASE_TABLET | Freq: Every day | ORAL | 3 refills | Status: DC

## 2018-03-27 MED ORDER — BISOPROLOL FUMARATE 5 MG PO TABS
5.0000 mg | ORAL_TABLET | Freq: Every day | ORAL | 3 refills | Status: DC
Start: 2018-03-27 — End: 2018-05-01

## 2018-03-27 MED ORDER — BISOPROLOL FUMARATE 5 MG PO TABS: 5.0000 mg | ORAL_TABLET | Freq: Every day | ORAL | 3 refills | Status: DC

## 2018-03-27 MED ORDER — TORSEMIDE 20 MG PO TABS: ORAL_TABLET | ORAL | 3 refills | Status: DC

## 2018-03-27 NOTE — Patient Instructions (Addendum)
Restart Potassium 20 meq (1 tab) daily  -And take additional 20 meq (1 tab) with metolazone weekly.   Increase Bisoprolol 5 mg ( 1 tab) daily  Increase Torsemide 40 mg (2 tabs), twice a day for 4 days  -Then take 40 mg (2 tabs) in AM, and 20 mg (1 tab) in PM   Labs drawn today (if we do not call you, then your lab work was stable)   Your physician recommends that you return for lab work in: 10 days  Your physician has requested that you have an echocardiogram. Echocardiography is a painless test that uses sound waves to create images of your heart. It provides your doctor with information about the size and shape of your heart and how well your heart's chambers and valves are working. This procedure takes approximately one hour. There are no restrictions for this procedure. (they will call you)   Your physician recommends that you schedule a follow-up appointment in: 2 weeks with APP

## 2018-03-28 ENCOUNTER — Ambulatory Visit (INDEPENDENT_AMBULATORY_CARE_PROVIDER_SITE_OTHER): Payer: Medicare HMO | Admitting: Cardiovascular Disease

## 2018-03-28 ENCOUNTER — Encounter: Payer: Self-pay | Admitting: Cardiovascular Disease

## 2018-03-28 ENCOUNTER — Telehealth (HOSPITAL_COMMUNITY): Payer: Self-pay | Admitting: Cardiology

## 2018-03-28 VITALS — BP 100/64 | HR 79 | Ht 60.0 in | Wt 255.0 lb

## 2018-03-28 DIAGNOSIS — I739 Peripheral vascular disease, unspecified: Secondary | ICD-10-CM

## 2018-03-28 DIAGNOSIS — Z9862 Peripheral vascular angioplasty status: Secondary | ICD-10-CM | POA: Diagnosis not present

## 2018-03-28 NOTE — Progress Notes (Signed)
03/28/2018 [name]   [birth year]  [id number]  Primary Physician Sandi Mariscal, MD Primary Cardiologist: [name] MD FACP, [place], [place], [place]  HPI:  [name] is a 57 y.o.  moderately overweight single Caucasian female mother of one child who lives with her sister who accompanies her today. She was originally referred by Dr. Franki Monte at [facility] for peripheral vascular evaluation because of critical limb ischemia. I last saw her in the office  08/18/2017. She sees Dr. Jenkins Rouge at [facility] for cardiomyopathy and paroxysmal atrial fibrillation. Her cardiac risk factors include a 20-pack-year history of tobacco abuse (she has stopped smoking), treated diabetes, and hypertension as well as family history of heart disease. She's never had a heart attack or stroke. She does have COPD. She had paroxysmal atrial fibrillation in the past and has undergone cardioversion in [place].. The lower extremity arterial Doppler studies performed in our office suggested critical limb ischemia with an occluded SFA and popliteal arteries bilaterally with tibial vessel disease as well. She complains of lifestyle limiting claudication. I performed lower extremity angiography on 09/10/13 revealing occluded SFAs bilaterally with chronic occluded tibial vessels as well. I performed TurboHawk directional atherectomy of her entire right SFA with an excellent angiographic result removing a copious amount of atherosclerotic plaque. The ulcer on her right heel ultimately healed. She does continue to smoke one pack per day. She was recently admitted to [facility] because of atrial fibrillation with rapid ventricular response. She underwent cardiac catheterization on 10/31/14 revealing an ejection fraction of 30-35% with disease that was ultimately treated medically. Her carotid Doppler showed high-grade bilateral ICA disease and lower extremity Dopplers revealed restenosis within the right  SFA with a ABI of 0.34. She is symptomatic on that side. I performed angiography on her 01/15/15 revealing an occluded right internal carotid artery, 95% proximal left internal carotid artery stenosis, long 99% stenosis within the right SFA and an occluded left SFA. I performed [facility] 1 directional atherectomy and drug-eluting balloon angioplasty of her right SFA with an excellent Angiographic and clinical result. She underwent left carotid endarterectomy by Dr. Trula Slade on 02/19/15 which was uncomplicated. Since I saw her 6 months ago she's been remaining clinically stable. She denies chest pain or shortness of breath. She continues to have claudication.Lower extremity Doppler studies performed 01/23/15 revealed ABIs in the 0.6 range bilaterally with a patent right SFA. She continues to complain of claudication however.recent Dopplers performed 12/22/15 revealed a right TBI 0. 17. She was admitted to the hospital with altered mental status and sepsis 04/06/17 for one week. 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 I suspect performed angiography of her 04/13/17 revealing an occluded right SFA was 0 vessel runoff. Atherectomy I's are entire right SFA and she subsequently underwent right second ray resection by Dr. Sharol Given to on 04/28/17.She has stopped smoking since that hospitalization. She also underwent a right transmetatarsal amputation by Dr. Sharol Given which has subsequently healed. Recent lower extremity Dopplers performed 05/29/17 revealed a right ABI 0.65 with a patent right SFA and a left ABI 0.31. Since I saw her 6 months ago she is done well recently when she saw Dr. Aundra Dubin in the office complaining of severe left lower extremity claudication.     Current Meds  Medication Sig  . acetaminophen (TYLENOL) 325 MG tablet Take 650 mg by mouth. Take 650 mg q6h prn and 650 mg BID scheduled  . albuterol (  PROVENTIL HFA;VENTOLIN HFA) 108 (90 Base) MCG/ACT inhaler Inhale 2 puffs into the lungs every 6 (six) hours  as needed for wheezing or shortness of breath.  . Amino Acids-Protein Hydrolys (FEEDING SUPPLEMENT, PRO-STAT SUGAR FREE 64,) LIQD Take 30 mLs by mouth 2 (two) times daily.  [name] amiodarone (PACERONE) 100 MG tablet Take 100 mg by mouth daily.  [name] amLODipine (NORVASC) 10 MG tablet Take 1 tablet (10 mg total) by mouth daily.  [name] apixaban (ELIQUIS) 5 MG TABS tablet Take 1 tablet (5 mg total) by mouth 2 (two) times daily.  [name] atorvastatin (LIPITOR) 40 MG tablet TAKE 1 TABLET BY MOUTH ONCE EVERY EVENING  . bisoprolol (ZEBETA) 5 MG tablet Take 1 tablet (5 mg total) by mouth daily.  . calcium carbonate (TUMS - DOSED IN MG ELEMENTAL CALCIUM) 500 MG chewable tablet Chew 1 tablet by mouth every 6 (six) hours as needed for indigestion or heartburn.  . cholecalciferol (VITAMIN D) 1000 units tablet Take 1,000 Units by mouth daily.  . clopidogrel (PLAVIX) 75 MG tablet Take 1 tablet (75 mg total) by mouth daily with breakfast.  . cyanocobalamin 1000 MCG tablet Take 1 tablet (1,000 mcg total) by mouth daily.  [name] docusate sodium (COLACE) 100 MG capsule Take 1 capsule (100 mg total) by mouth 2 (two) times daily.  . DULoxetine (CYMBALTA) 60 MG capsule Take 1 capsule (60 mg total) by mouth daily.  . folic acid (FOLVITE) 1 MG tablet TAKE 1 TABLET BY MOUTH EVERY DAY  . insulin aspart (NOVOLOG) 100 UNIT/ML injection Inject 30 Units into the skin 3 (three) times daily before meals.  . Insulin Glargine (LANTUS SOLOSTAR) 100 UNIT/ML Solostar Pen Inject 64 Units into the skin daily. Daily at 8PM  . levothyroxine (SYNTHROID, LEVOTHROID) 50 MCG tablet Take 1 tablet (50 mcg total) by mouth daily.  [name] losartan (COZAAR) 25 MG tablet Take 1 tablet (25 mg total) by mouth daily.  . metolazone (ZAROXOLYN) 2.5 MG tablet Take 2.5 mg by mouth once a week.  . Multiple Vitamin (MULTIVITAMIN) tablet Take 1 tablet by mouth daily.  . potassium chloride SA (K-DUR,KLOR-CON) 20 MEQ tablet Take 1 tablet (20 mEq total) by mouth daily. Take an addition  20 meq with metolazone weekly  . senna-docusate (SENOKOT-S) 8.6-50 MG tablet Take 2 tablets by mouth 2 (two) times daily.  [name] spironolactone (ALDACTONE) 25 MG tablet Take 25 mg by mouth daily.  . SYMBICORT 160-4.5 MCG/ACT inhaler Inhale 2 puffs into the lungs 2 (two) times daily.  [name] torsemide (DEMADEX) 20 MG tablet Take 2 tablets (40 mg total) by mouth every morning AND 1 tablet (20 mg total) every evening.  . traZODone (DESYREL) 100 MG tablet Take 1 tablet (100 mg total) by mouth at bedtime.     Allergies  Allergen Reactions  . Gabapentin Nausea And Vomiting    POSSIBLE SHAKING? TAKING MED CURRENTLY  . Lyrica [Pregabalin] Other (See Comments)    shaking    Social History   Socioeconomic History  . Marital status: Divorced    Spouse name: Not on file  . Number of children: Not on file  . Years of education: Not on file  . Highest education level: Not on file  Occupational History  . Occupation: disabled  Social Needs  . Financial resource strain: Not on file  . Food insecurity:    Worry: Not on file    Inability: Not on file  . Transportation needs:    Medical: Not on file    Non-medical:  Not on file  Tobacco Use  . Smoking status: Former Smoker    Packs/day: 0.00    Years: 40.00    Pack years: 0.00    Types: E-cigarettes    Last attempt to quit: 04/05/2017    Years since quitting: 0.9  . Smokeless tobacco: Former Systems developer    Types: Snuff  Substance and Sexual Activity  . Alcohol use: No    Alcohol/week: 0.0 oz    Comment: former alcohol abuse  . Drug use: Yes    Types: "Crack" cocaine, Marijuana, Oxycodone    Comment: 04/29/2015 "last drug use was ~ 09/08/2013"  . Sexual activity: Never  Lifestyle  . Physical activity:    Days per week: Not on file    Minutes per session: Not on file  . Stress: Not on file  Relationships  . Social connections:    Talks on phone: Not on file    Gets together: Not on file    Attends religious service: Not on file    Active member  of club or organization: Not on file    Attends meetings of clubs or organizations: Not on file    Relationship status: Not on file  . Intimate partner violence:    Fear of current or ex partner: Not on file    Emotionally abused: Not on file    Physically abused: Not on file    Forced sexual activity: Not on file  Other Topics Concern  . Not on file  Social History Narrative   Lives in [place], in [place] with sister.  They are looking to move but don't have a place to go yet.       Review of Systems: General: negative for chills, fever, night sweats or weight changes.  Cardiovascular: negative for chest pain, dyspnea on exertion, edema, orthopnea, palpitations, paroxysmal nocturnal dyspnea or shortness of breath Dermatological: negative for rash Respiratory: negative for cough or wheezing Urologic: negative for hematuria Abdominal: negative for nausea, vomiting, diarrhea, bright red blood per rectum, melena, or hematemesis Neurologic: negative for visual changes, syncope, or dizziness All other systems reviewed and are otherwise negative except as noted above.    Blood pressure 100/64, pulse 79, height 5' (1.524 m), weight 255 lb (115.7 kg), last menstrual period 11/27/2012, SpO2 92 %.  General appearance: alert and no distress Neck: no adenopathy, no carotid bruit, no JVD, supple, symmetrical, trachea midline and thyroid not enlarged, symmetric, no tenderness/mass/nodules Lungs: clear to auscultation bilaterally Heart: regular rate and rhythm, S1, S2 normal, no murmur, click, rub or gallop Extremities: extremities normal, atraumatic, no cyanosis or edema Pulses: Diminished pedal pulses bilaterally Skin: Skin color, texture, turgor normal. No rashes or lesions Neurologic: Alert and oriented X 3, normal strength and tone. Normal symmetric reflexes. Normal coordination and gait  EKG not performed today  ASSESSMENT AND PLAN:   S/P peripheral artery angioplasty - TurboHawk  atherectomy; R SFA History of critical limb ischemia right SFA 01/15/2015 3 stenosis every intervention 04/13/2017.  She ultimately underwent a ray resection by Dr. Sharol Given which healed.  She now complains of worsening of lower extremity claudication.  At the time of angiography in 2016 her left SFA is occluded from the origin down to the adductor canal one-vessel runoff via an anterior tibial artery which was diffusely diseased I will arrange for her to undergo angiography potential endovascular therapy of her chronically occluded left SFA CTO      [name] MD [facility], [facility] 03/28/2018 3:17 PM

## 2018-03-28 NOTE — Assessment & Plan Note (Signed)
History of critical limb ischemia right SFA 01/15/2015 3 stenosis every intervention 04/13/2017.  She ultimately underwent a ray resection by Dr. Sharol Given which healed.  She now complains of worsening of lower extremity claudication.  At the time of angiography in 2016 her left SFA is occluded from the origin down to the adductor canal one-vessel runoff via an anterior tibial artery which was diffusely diseased I will arrange for her to undergo angiography potential endovascular therapy of her chronically occluded left SFA CTO

## 2018-03-28 NOTE — Progress Notes (Signed)
Patient ID: [name], female   DOB: 14-Aug-1961, 57 y.o.   MRN: [id number]    Advanced Heart Failure Clinic Note   PCP: Dr Roxy Manns HF Cardiology: Dr. Aundra Dubin PV: Dr. Gwenlyn Found  57 yo with history of PAD, carotid stenosis s/p left CEA, relatively mild CAD, chronic systolic CHF, paroxysmal atrial fibrillation and prior substance abuse presents for CHF clinic followup.  She was admitted in 56/17 with acute hypoxemic respiratory failure in the setting of atrial fibrillation/RVR and volume overload.  She was initially intubated.  She converted back to NSR with amiodarone gtt.  She was treated with IV Lasix, steroids, bronchodilators.  She developed AKI and losartan was stopped.    She was admitted in 3/17 with symptomatic bradycardia.  She was supposed to stop diltiazem after this admission but continued to take it.  She presented later in 3/17, again with symptomatic bradycardia (junctional bradycardia), hypotension, and AKI.  Diltiazem and bisoprolol were stopped.  HR recovered and she has had no problems since.  ACEI was stopped with AKI.   In 5/18, she had peripheral angiography with atherectomy of right SFA.  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 7/18 ABIs showed 0.65 on right, 0.31 on left.    Last echo in 1/18 showed EF 40-45%.   Patient returns today for followup of CHF, atrial fibrillation, and PAD. She has constant pain in her left foot and left calf hurts with short distances walking. She has followup with Dr Gwenlyn Found for [place].  She is most limited by claudication, she does not note dyspnea with short distances walking. She quit smoking again in 4/19. No chest pain.  She has OSA but cannot tolerate CPAP. No orthopnea/PND.  She continues to take metolazone once a week.  Weight is up. She is no longer using home oxygen. No BRBPR/melena.  No palpitations.  She is in NSR.   Labs (1/17): K 5.2, creatinine 1.4, AST 43, ALT normal Labs (2/17): K  4.2, creatinine 1.3, BNP 607, TSH normal Labs (3/17): K 4.2, creatinine 1.45, AST/ALT normal, HCT 28.7 Labs (4/17): K 4.4, creatinine 1.61 Labs (08/31/2016): K 4.5 Creatinine 1.43  Labs (11/17): K 4.2, creatinine 1.4 Labs (9/18): K 4.1, creatinine 1.1, TSH normal  ECG (personally reviewed): NSR, normal.   PMH: 1. Carotid stenosis: Known occluded right carotid.  Left CEA in 4/16.  2. CAD: LHC in 12/15 with 80% stenosis in small OM1, nonobstructive disease in other territories.  3. Chronic systolic CHF: Nonischemic cardiomyopathy (?due to ETOH or prior drug abuse).  - Echo (1/17) with EF 45%, mild LV hypertrophy, moderate diastolic dysfunction, inferolateral severe hypokinesis, mildly decreased RV systolic function.  - Echo (1/18): EF 40-45%, mild LVH, normal RV size and systolic function.  4. Atrial fibrillation: Paroxysmal.   5. Type II diabetes 6. CKD: Stage III. 7. COPD: Quit smoking 1/17. On home oxygen.  8. 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  No longer drinks.  9. Hypothyroidism 10. PAD: Atherectomy SFA in 2014 (Dr Gwenlyn Found).  Peripheral arterial dopplers (2/17) with focal 75-99% proximal right SFA stenosis, occluded mid-distal right SFA, chronic occlusion of all runoff arteries on right. - In 5/18, she had peripheral angiography with atherectomy of right SFA.   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  - 7/18 ABIs showed 0.65 on right, 0.31 on left.   11. Anemia 12. Prior cocaine abuse.  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   SH: Lives with sister.  Prior cocaine abuse.  Prior ETOH abuse.  Quit smoking in 1/17, restarted, and quit again in 4/19.  FH: CAD  ROS: All systems reviewed and negative except as per HPI.   Current Outpatient Medications  Medication Sig Dispense Refill  . acetaminophen (TYLENOL) 325 MG tablet Take 650 mg by mouth. Take 650 mg q6h prn and 650 mg BID scheduled      . albuterol (PROVENTIL HFA;VENTOLIN HFA) 108 (90 Base) MCG/ACT inhaler Inhale 2 puffs into the lungs every 6 (six) hours as needed for wheezing or shortness of breath. 18 g 0  . Amino Acids-Protein Hydrolys (FEEDING SUPPLEMENT, PRO-STAT SUGAR FREE 64,) LIQD Take 30 mLs by mouth 2 (two) times daily. 900 mL 0  . amiodarone (PACERONE) 100 MG tablet Take 100 mg by mouth daily.    [name] amLODipine (NORVASC) 10 MG tablet Take 1 tablet (10 mg total) by mouth daily. 30 tablet 0  . apixaban (ELIQUIS) 5 MG TABS tablet Take 1 tablet (5 mg total) by mouth 2 (two) times daily. 60 tablet 0  . atorvastatin (LIPITOR) 40 MG tablet TAKE 1 TABLET BY MOUTH ONCE EVERY EVENING 30 tablet 0  . bisoprolol (ZEBETA) 5 MG tablet Take 1 tablet (5 mg total) by mouth daily. 30 tablet 3  . calcium carbonate (TUMS - DOSED IN MG ELEMENTAL CALCIUM) 500 MG chewable tablet Chew 1 tablet by mouth every 6 (six) hours as needed for indigestion or heartburn.    . cholecalciferol (VITAMIN D) 1000 units tablet Take 1,000 Units by mouth daily.    . clopidogrel (PLAVIX) 75 MG tablet Take 1 tablet (75 mg total) by mouth daily with breakfast. 30 tablet 0  . cyanocobalamin 1000 MCG tablet Take 1 tablet (1,000 mcg total) by mouth daily. 30 tablet 10  . docusate sodium (COLACE) 100 MG capsule Take 1 capsule (100 mg total) by mouth 2 (two) times daily. 10 capsule 0  . DULoxetine (CYMBALTA) 60 MG capsule Take 1 capsule (60 mg total) by mouth daily. 30 capsule 0  . folic acid (FOLVITE) 1 MG tablet TAKE 1 TABLET BY MOUTH EVERY DAY 90 tablet 1  . insulin aspart (NOVOLOG) 100 UNIT/ML injection Inject 30 Units into the skin 3 (three) times daily before meals. 30 mL 0  . Insulin Glargine (LANTUS SOLOSTAR) 100 UNIT/ML Solostar Pen Inject 64 Units into the skin daily. Daily at 8PM 15 mL 0  . levothyroxine (SYNTHROID, LEVOTHROID) 50 MCG tablet Take 1 tablet (50 mcg total) by mouth daily. 30 tablet 0  . losartan (COZAAR) 25 MG tablet Take 1 tablet (25 mg total)  by mouth daily. 30 tablet 0  . metolazone (ZAROXOLYN) 2.5 MG tablet Take 2.5 mg by mouth once a week.    . Multiple Vitamin (MULTIVITAMIN) tablet Take 1 tablet by mouth daily.    [name] senna-docusate (SENOKOT-S) 8.6-50 MG tablet Take 2 tablets by mouth 2 (two) times daily.    [name] spironolactone (ALDACTONE) 25 MG tablet Take 1 tablet (25 mg total) by mouth daily. 90 tablet 3  . spironolactone (ALDACTONE) 25 MG tablet Take 25 mg by mouth daily.    . SYMBICORT 160-4.5 MCG/ACT inhaler Inhale 2 puffs into the lungs 2 (two) times daily. 10.2 g 0  . torsemide (DEMADEX) 20 MG tablet Take 2 tablets (40 mg total) by mouth every morning AND 1 tablet (20 mg total) every evening. 90 tablet 3  . traZODone (DESYREL) 100  MG tablet Take 1 tablet (100 mg total) by mouth at bedtime. 30 tablet 0  . potassium chloride SA (K-DUR,KLOR-CON) 20 MEQ tablet Take 1 tablet (20 mEq total) by mouth daily. Take an addition 20 meq with metolazone weekly 35 tablet 3   No current facility-administered medications for this encounter.    Vitals:   03/27/18 1047  BP: (!) 155/68  Pulse: 85  SpO2: 98%  Weight: 256 lb (116.1 kg)     Wt Readings from Last 3 Encounters:  03/27/18 256 lb (116.1 kg)  08/18/17 257 lb 12.8 oz (116.9 kg)  08/09/17 243 lb 2.7 oz (110.3 kg)    General: NAD Neck: JVP 10-11 cm, no thyromegaly or thyroid nodule.  Lungs: Distant breath sounds.  CV: Nondisplaced PMI.  Heart regular S1/S2, no S3/S4, no murmur.  1-2+ edema to knees bilaterally.  No carotid bruit.  Unable to palpate pedal pulses.  Abdomen: Soft, nontender, no hepatosplenomegaly, no distention.  Skin: Intact without lesions or rashes.  Neurologic: Alert and oriented x 3.  Psych: Normal affect. Extremities: No clubbing or cyanosis. S/p right transmetatarsal amputation.  HEENT: Normal.   Assessment/Plan: 1. Chronic systolic CHF: EF 11-65% on last echo. Has been presumed nonischemic given LHC in 12/15 showing only 80% stenosis in small OM1.   NYHA class II-III.  She is volume overloaded on exam.  - Increase torsemide to 40 mg bid x 2 days then 40 qam/20 qpm.  BMET today and again in 10 days.  Restart KCl 20 daily (40 on metolazone days).  - Continue metolazone 2.5 mg Thursdays with extra 20 meq of potassium.   - Increase bisoprolol to 5 mg daily. Has been intolerant of coreg.  - Continue losartan 25 mg daily.     - Continue spironolactone 25 mg daily.  - Will order repeat echo.  2. Atrial fibrillation: Paroxysmal.  NSR today.  - Continue amiodarone 100 mg daily. Check LFTs. PCP following hypothyroidism (on Levoxyl).   Needs regular eye exams. - On coumadin. CBC today.  3. PAD: Claudication on left seems limiting.  No ulcerations.  She has foot pain almost constantly.  Not candidate for cilostazol with CHF.  She has been following with Dr. Gwenlyn Found. 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  - She has followup with Dr. Gwenlyn Found.  4. COPD: No longer on home oxygen.  Has stopped smoking.  5. H/o cirrhosis: Likely from ETOH, no longer drinks.  6. OSA: Unable to tolerate CPAP.   7. CKD stage III:  BMET today.  8. HTN:  Elevated today.  - Increasing bisoprolol as above.  9. Bradycardia: Junctional rhythm when hospitalized in 3/17.  No further problems with bradycardia since that time.   Followup with NP/PA to reassess volume in 2 wks.    Loralie Champagne 03/28/2018

## 2018-03-28 NOTE — Patient Instructions (Addendum)
   [facility] [address] [place] [address] Dept: [phone] Loc: [place]  03/28/2018    Pre-procedure testing: Your physician has requested that you have a lower extremity arterial duplex. During this test, ultrasound is used to evaluate arterial blood flow in the legs. Allow one hour for this exam. There are no restrictions or special instructions.  Your physician has requested that you have an ankle brachial index (ABI). During this test an ultrasound and blood pressure cuff are used to evaluate the arteries that supply the arms and legs with blood. Allow thirty minutes for this exam. There are no restrictions or special instructions.  ___________________________________________________________________________________   Dennis Bast are scheduled for a Peripheral Angiogram on Thursday, May 30 with Dr. Quay Burow.  1. Please arrive at the [facility] (Main Entrance A) at [facility]: [address] [place], [place] [address] at 11:30 AM (two hours before your procedure to ensure your preparation). Free valet parking service is available.   Special note: Every effort is made to have your procedure done on time. Please understand that emergencies sometimes delay scheduled procedures.  2. Diet: Do not eat or drink anything after midnight prior to your procedure except sips of water to take medications.  3. Labs: Please have labs drawn in the office today.  4. Medication instructions in preparation for your procedure:  Stop taking Eliquis (Apixiban) on Tuesday, May 28.  Take only 32 units of insulin the night before your procedure. Do not take any insulin on the day of the procedure. Do not take any other diabetic medicines the morning of your procedure.  HOLD Torsemide the morning of your procedure.  On the morning of your procedure, take your aspirin and any  morning medicines NOT listed above.  You may use sips of water.  5. Plan for one night stay--bring personal belongings. 6. Bring a current list of your medications and current insurance cards. 7. You MUST have a responsible person to drive you home. 8. Someone MUST be with you the first 24 hours after you arrive home or your discharge will be delayed. 9. Please wear clothes that are easy to get on and off and wear slip-on shoes.  Thank you for allowing [place] to care for you!   -- [facility]

## 2018-03-28 NOTE — Addendum Note (Signed)
Addended by: Therisa Doyne on: 03/28/2018 03:35 PM   Modules accepted: Orders

## 2018-03-29 LAB — APTT: aPTT: 27 s (ref 24–33)

## 2018-03-29 LAB — CBC WITH DIFFERENTIAL/PLATELET
BASOS ABS: 0 10*3/uL (ref 0.0–0.2)
Basos: 0 %
EOS (ABSOLUTE): 1.8 10*3/uL — AB (ref 0.0–0.4)
Eos: 15 %
HEMOGLOBIN: 11.3 g/dL (ref 11.1–15.9)
Hematocrit: 34.9 % (ref 34.0–46.6)
IMMATURE GRANS (ABS): 0 10*3/uL (ref 0.0–0.1)
Immature Granulocytes: 0 %
LYMPHS: 16 %
Lymphocytes Absolute: 1.9 10*3/uL (ref 0.7–3.1)
MCH: 29 pg (ref 26.6–33.0)
MCHC: 32.4 g/dL (ref 31.5–35.7)
MCV: 90 fL (ref 79–97)
MONOCYTES: 7 %
Monocytes Absolute: 0.9 10*3/uL (ref 0.1–0.9)
Neutrophils Absolute: 7.4 10*3/uL — ABNORMAL HIGH (ref 1.4–7.0)
Neutrophils: 62 %
PLATELETS: 323 10*3/uL (ref 150–379)
RBC: 3.89 x10E6/uL (ref 3.77–5.28)
RDW: 14.9 % (ref 12.3–15.4)
WBC: 12.1 10*3/uL — AB (ref 3.4–10.8)

## 2018-03-29 LAB — BASIC METABOLIC PANEL
BUN/Creatinine Ratio: 24 — ABNORMAL HIGH (ref 9–23)
BUN: 35 mg/dL — AB (ref 6–24)
CALCIUM: 8.9 mg/dL (ref 8.7–10.2)
CHLORIDE: 93 mmol/L — AB (ref 96–106)
CO2: 32 mmol/L — AB (ref 20–29)
Creatinine, Ser: 1.44 mg/dL — ABNORMAL HIGH (ref 0.57–1.00)
GFR calc Af Amer: 47 mL/min/{1.73_m2} — ABNORMAL LOW (ref >59)
GFR calc non Af Amer: 41 mL/min/{1.73_m2} — ABNORMAL LOW (ref >59)
GLUCOSE: 216 mg/dL — AB (ref 65–99)
POTASSIUM: 4.4 mmol/L (ref 3.5–5.2)
Sodium: 139 mmol/L (ref 134–144)

## 2018-03-29 LAB — PROTIME-INR
INR: 1 (ref 0.8–1.2)
PROTHROMBIN TIME: 10.5 s (ref 9.1–12.0)

## 2018-03-29 NOTE — Telephone Encounter (Signed)
User: [name] A Date/time: 03/28/18 10:40 AM  Comment: Called pt and lmsg for him to CB to get sch for an echo.   Context:  Outcome: Left Message  Phone number: [phone] Phone Type: Home Phone  Comm. type: Telephone Call type: Outgoing  Contact: [name] S Relation to patient: Self

## 2018-03-30 ENCOUNTER — Encounter (HOSPITAL_COMMUNITY): Payer: Self-pay

## 2018-04-03 ENCOUNTER — Other Ambulatory Visit (HOSPITAL_COMMUNITY): Payer: Medicare HMO

## 2018-04-03 DIAGNOSIS — R0989 Other specified symptoms and signs involving the circulatory and respiratory systems: Secondary | ICD-10-CM

## 2018-04-05 ENCOUNTER — Ambulatory Visit (HOSPITAL_COMMUNITY)
Admission: RE | Admit: 2018-04-05 | Discharge: 2018-04-05 | Disposition: A | Discharge status: Home / Self Care | Payer: Medicare HMO | Source: Ambulatory Visit | Attending: Internal Medicine | Admitting: Internal Medicine

## 2018-04-05 DIAGNOSIS — I10 Essential (primary) hypertension: Secondary | ICD-10-CM | POA: Diagnosis not present

## 2018-04-05 DIAGNOSIS — I70202 Unspecified atherosclerosis of native arteries of extremities, left leg: Secondary | ICD-10-CM | POA: Diagnosis not present

## 2018-04-05 DIAGNOSIS — E1159 Type 2 diabetes mellitus with other circulatory complications: Secondary | ICD-10-CM | POA: Diagnosis not present

## 2018-04-05 DIAGNOSIS — E785 Hyperlipidemia, unspecified: Secondary | ICD-10-CM | POA: Insufficient documentation

## 2018-04-05 DIAGNOSIS — F17200 Nicotine dependence, unspecified, uncomplicated: Secondary | ICD-10-CM | POA: Insufficient documentation

## 2018-04-05 DIAGNOSIS — I739 Peripheral vascular disease, unspecified: Secondary | ICD-10-CM

## 2018-04-06 ENCOUNTER — Ambulatory Visit (HOSPITAL_COMMUNITY)
Admission: RE | Admit: 2018-04-06 | Discharge: 2018-04-06 | Disposition: A | Discharge status: Home / Self Care | Payer: Medicare HMO | Source: Ambulatory Visit | Attending: Internal Medicine | Admitting: Internal Medicine

## 2018-04-06 DIAGNOSIS — I5022 Chronic systolic (congestive) heart failure: Secondary | ICD-10-CM | POA: Insufficient documentation

## 2018-04-06 LAB — BASIC METABOLIC PANEL
ANION GAP: 9 (ref 5–15)
BUN: 50 mg/dL — ABNORMAL HIGH (ref 6–20)
CO2: 31 mmol/L (ref 22–32)
Calcium: 9.2 mg/dL (ref 8.9–10.3)
Chloride: 100 mmol/L — ABNORMAL LOW (ref 101–111)
Creatinine, Ser: 1.72 mg/dL — ABNORMAL HIGH (ref 0.44–1.00)
GFR calc Af Amer: 37 mL/min — ABNORMAL LOW (ref >60)
GFR, EST NON AFRICAN AMERICAN: 32 mL/min — AB (ref >60)
GLUCOSE: 155 mg/dL — AB (ref 65–99)
POTASSIUM: 4.8 mmol/L (ref 3.5–5.1)
Sodium: 140 mmol/L (ref 135–145)

## 2018-04-10 ENCOUNTER — Telehealth: Payer: Self-pay | Admitting: *Deleted

## 2018-04-10 NOTE — Telephone Encounter (Signed)
Spoke with pt, she had repeat labs on the 24th, her kidney function has gotten worse. Per dr berry her PV procedure has been canceled. Spoke with heather in the [facility] where the patient has an appointment tomorrow. They are aware of the kidney function and the procedure being cx.

## 2018-04-10 NOTE — Progress Notes (Signed)
Patient ID: [name], female   DOB: [birth year], 57 y.o.   MRN: [id number]    Advanced Heart Failure Clinic Note   PCP: Dr Roxy Manns HF Cardiology: Dr. Aundra Dubin PV: Dr. Gwenlyn Found  57 yo with history of PAD, carotid stenosis s/p left CEA, relatively mild CAD, chronic systolic CHF, paroxysmal atrial fibrillation and prior substance abuse presents for CHF clinic followup.  She was admitted in 1/17 with acute hypoxemic respiratory failure in the setting of atrial fibrillation/RVR and volume overload.  She was initially intubated.  She converted back to NSR with amiodarone gtt.  She was treated with IV Lasix, steroids, bronchodilators.  She developed AKI and losartan was stopped.    She was admitted in 3/17 with symptomatic bradycardia.  She was supposed to stop diltiazem after this admission but continued to take it.  She presented later in 3/17, again with symptomatic bradycardia (junctional bradycardia), hypotension, and AKI.  Diltiazem and bisoprolol were stopped.  HR recovered and she has had no problems since.  ACEI was stopped with AKI.   In 5/18, she had peripheral angiography with atherectomy of right SFA.  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 7/18 ABIs showed 0.65 on right, 0.31 on left.    Last echo in 1/18 showed EF 40-45%.   She presents today for follow up. Last visit torsemide icnreased with volume overload. Following with Dr. Gwenlyn Found for PAD. Angiography cancelled with AKI. She is feeling more SOB. Has noted an increase in her peripheral edema with some drainage and tenderness. Denies CP. Not smoking.  Weight is down despite worsening edema and SOB. Denies BRPBR or melena. No palpitations. She reports taking all medications as directed.   Labs (1/17): K 5.2, creatinine 1.4, AST 43, ALT normal Labs (2/17): K 4.2, creatinine 1.3, BNP 607, TSH normal Labs (3/17): K 4.2, creatinine 1.45, AST/ALT normal, HCT 28.7 Labs (4/17): K 4.4,  creatinine 1.61 Labs (08/31/2016): K 4.5 Creatinine 1.43  Labs (11/17): K 4.2, creatinine 1.4 Labs (9/18): K 4.1, creatinine 1.1, TSH normal  PMH: 1. Carotid stenosis: Known occluded right carotid.  Left CEA in 4/16.  2. CAD: LHC in 12/15 with 80% stenosis in small OM1, nonobstructive disease in other territories.  3. Chronic systolic CHF: Nonischemic cardiomyopathy (?due to ETOH or prior drug abuse).  - Echo (1/17) with EF 45%, mild LV hypertrophy, moderate diastolic dysfunction, inferolateral severe hypokinesis, mildly decreased RV systolic function.  - Echo (1/18): EF 40-45%, mild LVH, normal RV size and systolic function.  4. Atrial fibrillation: Paroxysmal.   5. Type II diabetes 6. CKD: Stage III. 7. COPD: Quit smoking 1/17. On home oxygen.  8. 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  No longer drinks.  9. Hypothyroidism 10. PAD: Atherectomy SFA in 2014 (Dr Gwenlyn Found).  Peripheral arterial dopplers (2/17) with focal 75-99% proximal right SFA stenosis, occluded mid-distal right SFA, chronic occlusion of all runoff arteries on right. - In 5/18, she had peripheral angiography with atherectomy of right SFA.   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  - 7/18 ABIs showed 0.65 on right, 0.31 on left.   11. Anemia 12. Prior cocaine abuse.  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   SH: Lives with sister.  Prior cocaine abuse.  Prior ETOH abuse.  Quit smoking in 1/17, restarted, and quit again in 4/19.  FH: CAD  Review of systems complete  and found to be negative unless listed in HPI.    Current Outpatient Medications  Medication Sig Dispense Refill  . acetaminophen (TYLENOL) 325 MG tablet Take 650 mg by mouth every 6 (six) hours as needed for mild pain or moderate pain.     [name] albuterol (PROVENTIL HFA;VENTOLIN HFA) 108 (90 Base) MCG/ACT inhaler Inhale 2 puffs into the lungs every 6 (six) hours as needed for  wheezing or shortness of breath. 18 g 0  . amiodarone (PACERONE) 100 MG tablet Take 100 mg by mouth daily.    [name] amLODipine (NORVASC) 10 MG tablet Take 1 tablet (10 mg total) by mouth daily. 30 tablet 0  . apixaban (ELIQUIS) 5 MG TABS tablet Take 1 tablet (5 mg total) by mouth 2 (two) times daily. 60 tablet 0  . atorvastatin (LIPITOR) 40 MG tablet TAKE 1 TABLET BY MOUTH ONCE EVERY EVENING (Patient taking differently: Take 40 mg by mouth daily. ) 30 tablet 0  . bisoprolol (ZEBETA) 5 MG tablet Take 1 tablet (5 mg total) by mouth daily. 30 tablet 3  . Buprenorphine HCl (BELBUCA) 150 MCG FILM Place 150 mcg inside cheek every 12 (twelve) hours.    . calcium carbonate (TUMS - DOSED IN MG ELEMENTAL CALCIUM) 500 MG chewable tablet Chew 2 tablets by mouth every 6 (six) hours as needed for indigestion or heartburn.     . cholecalciferol (VITAMIN D) 1000 units tablet Take 1,000 Units by mouth daily.    . clopidogrel (PLAVIX) 75 MG tablet Take 1 tablet (75 mg total) by mouth daily with breakfast. 30 tablet 0  . DULoxetine (CYMBALTA) 60 MG capsule Take 1 capsule (60 mg total) by mouth daily. 30 capsule 0  . insulin aspart (NOVOLOG) 100 UNIT/ML injection Inject 30 Units into the skin 3 (three) times daily before meals. 30 mL 0  . Insulin Glargine (LANTUS SOLOSTAR) 100 UNIT/ML Solostar Pen Inject 64 Units into the skin daily. Daily at 8PM (Patient taking differently: Inject 71 Units into the skin at bedtime. Daily at 8PM) 15 mL 0  . levothyroxine (SYNTHROID, LEVOTHROID) 50 MCG tablet Take 1 tablet (50 mcg total) by mouth daily. 30 tablet 0  . metolazone (ZAROXOLYN) 2.5 MG tablet Take 2.5 mg by mouth every Thursday.     . potassium chloride SA (K-DUR,KLOR-CON) 20 MEQ tablet Take 1 tablet (20 mEq total) by mouth daily. Take an addition 20 meq with metolazone weekly 35 tablet 3  . pregabalin (LYRICA) 75 MG capsule Take 75 mg by mouth daily.    [name] spironolactone (ALDACTONE) 25 MG tablet Take 25 mg by mouth daily.    .  SYMBICORT 160-4.5 MCG/ACT inhaler Inhale 2 puffs into the lungs 2 (two) times daily. 10.2 g 0  . torsemide (DEMADEX) 20 MG tablet Take 2 tablets (40 mg total) by mouth every morning AND 1 tablet (20 mg total) every evening. (Patient taking differently: Take 40 mg by mouth twice daily) 90 tablet 3  . traZODone (DESYREL) 100 MG tablet Take 1 tablet (100 mg total) by mouth at bedtime. 30 tablet 0  . Amino Acids-Protein Hydrolys (FEEDING SUPPLEMENT, PRO-STAT SUGAR FREE 64,) LIQD Take 30 mLs by mouth 2 (two) times daily. (Patient not taking: Reported on 04/04/2018) 900 mL 0  . cyanocobalamin 1000 MCG tablet Take 1 tablet (1,000 mcg total) by mouth daily. (Patient not taking: Reported on 04/04/2018) 30 tablet 10  . docusate sodium (COLACE) 100 MG capsule Take 1 capsule (100 mg total) by mouth 2 (two) times daily. (  Patient not taking: Reported on 04/04/2018) 10 capsule 0  . folic acid (FOLVITE) 1 MG tablet TAKE 1 TABLET BY MOUTH EVERY DAY (Patient not taking: Reported on 04/04/2018) 90 tablet 1  . losartan (COZAAR) 25 MG tablet Take 1 tablet (25 mg total) by mouth daily. (Patient not taking: Reported on 04/04/2018) 30 tablet 0   No current facility-administered medications for this encounter.    Vitals:   04/11/18 1214  BP: 136/74  Pulse: 82  SpO2: 95%  Weight: 252 lb 6.4 oz (114.5 kg)     Wt Readings from Last 3 Encounters:  04/11/18 252 lb 6.4 oz (114.5 kg)  03/28/18 255 lb (115.7 kg)  03/27/18 256 lb (116.1 kg)    Physical Exam  General: NAD  HEENT: Normal Neck: Supple. JVP 8-9 cm +. Carotids 2+ bilat; no bruits. No thyromegaly or nodule noted. Cor: PMI nondisplaced. RRR, No M/G/R noted Lungs: CTAB, normal effort. Abdomen: Soft, non-tender, non-distended, no HSM. No bruits or masses. +BS  Extremities: No cyanosis, clubbing, or rash. 2-3+ woody edema BLE with tenderness and several small areas of clear drainage. LLE with ace bandage wrap in place.  Neuro: Alert & orientedx3, cranial nerves  grossly intact. moves all 4 extremities w/o difficulty. Affect pleasant   Assessment/Plan: 1. Chronic systolic CHF: EF 54-98% on last echo. Has been presumed nonischemic given LHC in 12/15 showing only 80% stenosis in small OM1.   - NYHA III-IIIb symptoms. - Volume status very difficult. Her weight is down 4 lbs, though her legs are more swollen. Her JVP is difficult to measure, but appears at least 8-9 cm.  - Continue torsemide 40 qam for now. Await BMET today. Will order [facility] for unna boots - Continue metolazone 2.5 mg Thursdays with extra 20 meq of potassium.   - Continue bisoprolol 5 mg daily. Has been intolerant of coreg.  - Continue losartan 25 mg daily.     - Continue spironolactone 25 mg daily.  - Repeat Echo ordered.  2. Atrial fibrillation: Paroxysmal.   - Regular on exam.   - Continue amiodarone 100 mg daily. LFTs stable earlier this month. PCP following hypothyroidism (on Levoxyl).   Needs regular eye exams. - On coumadin per coumadin clinic 3. PAD: Claudication on left seems limiting.  No ulcerations.  She has foot pain almost constantly.  Not candidate for cilostazol with CHF.  She has been following with Dr. Gwenlyn Found. 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  - She has followup with Dr. Gwenlyn Found.  - Peripheral angiography deferred with AKI. BMET today.  4. COPD: - Stopped smoking. Was able to stop home O2.  5. H/o cirrhosis:  - Likely from ETOH, Now abstinent.  6. OSA: - Unable to tolerate CPAP.  7. CKD stage III:   - BMET today.  8. HTN:  - Meds as above.  9. Bradycardia:  - Junctional rhythm when hospitalized in 3/17. - Stable.   Pts volume is very difficult, and treatment has been limited by AKI. Labs today pending. If creatinine continues to climb up, may end up requiring admission for IV diuresis. Decision on peripheral angiography pending BMET today.   Shirley Friar, PA-C   04/11/2018  Greater than 50% of the 25 minute visit was spent in counseling/coordination of care regarding disease state education, salt/fluid restriction, sliding scale diuretics, and medication compliance.

## 2018-04-11 ENCOUNTER — Telehealth (HOSPITAL_COMMUNITY): Payer: Self-pay

## 2018-04-11 ENCOUNTER — Ambulatory Visit (HOSPITAL_COMMUNITY)
Admission: RE | Admit: 2018-04-11 | Discharge: 2018-04-11 | Disposition: A | Discharge status: Home / Self Care | Payer: Medicare HMO | Source: Ambulatory Visit | Attending: Internal Medicine | Admitting: Internal Medicine

## 2018-04-11 VITALS — BP 136/74 | HR 82 | Wt 252.4 lb

## 2018-04-11 DIAGNOSIS — I130 Hypertensive heart and chronic kidney disease with heart failure and stage 1 through stage 4 chronic kidney disease, or unspecified chronic kidney disease: Secondary | ICD-10-CM | POA: Insufficient documentation

## 2018-04-11 DIAGNOSIS — G4733 Obstructive sleep apnea (adult) (pediatric): Secondary | ICD-10-CM | POA: Insufficient documentation

## 2018-04-11 DIAGNOSIS — Z7902 Long term (current) use of antithrombotics/antiplatelets: Secondary | ICD-10-CM | POA: Diagnosis not present

## 2018-04-11 DIAGNOSIS — Z7951 Long term (current) use of inhaled steroids: Secondary | ICD-10-CM | POA: Diagnosis not present

## 2018-04-11 DIAGNOSIS — E039 Hypothyroidism, unspecified: Secondary | ICD-10-CM | POA: Diagnosis not present

## 2018-04-11 DIAGNOSIS — Z79899 Other long term (current) drug therapy: Secondary | ICD-10-CM | POA: Diagnosis not present

## 2018-04-11 DIAGNOSIS — E1122 Type 2 diabetes mellitus with diabetic chronic kidney disease: Secondary | ICD-10-CM | POA: Diagnosis not present

## 2018-04-11 DIAGNOSIS — I6523 Occlusion and stenosis of bilateral carotid arteries: Secondary | ICD-10-CM | POA: Diagnosis not present

## 2018-04-11 DIAGNOSIS — Z87891 Personal history of nicotine dependence: Secondary | ICD-10-CM | POA: Insufficient documentation

## 2018-04-11 DIAGNOSIS — K7460 Unspecified cirrhosis of liver: Secondary | ICD-10-CM | POA: Diagnosis not present

## 2018-04-11 DIAGNOSIS — N183 Chronic kidney disease, stage 3 unspecified: Secondary | ICD-10-CM

## 2018-04-11 DIAGNOSIS — J449 Chronic obstructive pulmonary disease, unspecified: Secondary | ICD-10-CM | POA: Diagnosis not present

## 2018-04-11 DIAGNOSIS — I10 Essential (primary) hypertension: Secondary | ICD-10-CM | POA: Diagnosis not present

## 2018-04-11 DIAGNOSIS — Z79890 Hormone replacement therapy (postmenopausal): Secondary | ICD-10-CM | POA: Diagnosis not present

## 2018-04-11 DIAGNOSIS — Z7901 Long term (current) use of anticoagulants: Secondary | ICD-10-CM | POA: Diagnosis not present

## 2018-04-11 DIAGNOSIS — I5022 Chronic systolic (congestive) heart failure: Secondary | ICD-10-CM | POA: Diagnosis present

## 2018-04-11 DIAGNOSIS — Z8249 Family history of ischemic heart disease and other diseases of the circulatory system: Secondary | ICD-10-CM | POA: Diagnosis not present

## 2018-04-11 DIAGNOSIS — I739 Peripheral vascular disease, unspecified: Secondary | ICD-10-CM | POA: Diagnosis not present

## 2018-04-11 DIAGNOSIS — Z720 Tobacco use: Secondary | ICD-10-CM

## 2018-04-11 DIAGNOSIS — I480 Paroxysmal atrial fibrillation: Secondary | ICD-10-CM | POA: Diagnosis not present

## 2018-04-11 DIAGNOSIS — R001 Bradycardia, unspecified: Secondary | ICD-10-CM

## 2018-04-11 DIAGNOSIS — N179 Acute kidney failure, unspecified: Secondary | ICD-10-CM | POA: Diagnosis not present

## 2018-04-11 DIAGNOSIS — E1151 Type 2 diabetes mellitus with diabetic peripheral angiopathy without gangrene: Secondary | ICD-10-CM | POA: Diagnosis not present

## 2018-04-11 DIAGNOSIS — I2510 Atherosclerotic heart disease of native coronary artery without angina pectoris: Secondary | ICD-10-CM | POA: Diagnosis not present

## 2018-04-11 DIAGNOSIS — Z794 Long term (current) use of insulin: Secondary | ICD-10-CM | POA: Diagnosis not present

## 2018-04-11 LAB — BASIC METABOLIC PANEL
Anion gap: 9 (ref 5–15)
BUN: 60 mg/dL — ABNORMAL HIGH (ref 6–20)
CALCIUM: 9.1 mg/dL (ref 8.9–10.3)
CHLORIDE: 97 mmol/L — AB (ref 101–111)
CO2: 28 mmol/L (ref 22–32)
CREATININE: 1.88 mg/dL — AB (ref 0.44–1.00)
GFR calc Af Amer: 34 mL/min — ABNORMAL LOW (ref >60)
GFR calc non Af Amer: 29 mL/min — ABNORMAL LOW (ref >60)
GLUCOSE: 448 mg/dL — AB (ref 65–99)
Potassium: 5.7 mmol/L — ABNORMAL HIGH (ref 3.5–5.1)
Sodium: 134 mmol/L — ABNORMAL LOW (ref 135–145)

## 2018-04-11 MED ORDER — TORSEMIDE 20 MG PO TABS: 40.0000 mg | ORAL_TABLET | Freq: Every day | ORAL | 3 refills | Status: DC

## 2018-04-11 NOTE — Telephone Encounter (Signed)
Notes recorded by Shirley Muscat, RN on 04/10/2018 at 3:26 PM EDT Pt aware of results, agreeable to med changes (changes made in [facility]), and no further questions   ------  Notes recorded by Larey Dresser, MD on 04/08/2018 at 9:55 PM EDT Hold torsemide for a day then decrease to 40 mg daily.

## 2018-04-11 NOTE — Patient Instructions (Signed)
Routine lab work today. Will notify you of abnormal results, otherwise no news is good news!  No changes to medication at this time.  Continue Torsemide 40 mg once daily.  Follow up 2-3 weeks.  _______________________________________________________________ [name] Code: 1157  Take all medication as prescribed the day of your appointment. Bring all medications with you to your appointment.  Do the following things EVERYDAY: 1) Weigh yourself in the morning before breakfast. Write it down and keep it in a log. 2) Take your medicines as prescribed 3) Eat low salt foods-Limit salt (sodium) to 2000 mg per day.  4) Stay as active as you can everyday 5) Limit all fluids for the day to less than 2 liters

## 2018-04-12 ENCOUNTER — Ambulatory Visit (HOSPITAL_COMMUNITY): Admission: RE | Admit: 2018-04-12 | Payer: Medicare HMO | Source: Ambulatory Visit | Admitting: Cardiovascular Disease

## 2018-04-12 ENCOUNTER — Encounter (HOSPITAL_COMMUNITY): Admission: RE | Payer: Self-pay | Source: Ambulatory Visit

## 2018-04-12 SURGERY — LOWER EXTREMITY ANGIOGRAPHY
Anesthesia: LOCAL

## 2018-04-13 ENCOUNTER — Other Ambulatory Visit: Payer: Self-pay

## 2018-04-13 ENCOUNTER — Emergency Department (HOSPITAL_COMMUNITY)
Admission: EM | Admit: 2018-04-13 | Discharge: 2018-04-14 | Disposition: A | Discharge status: Home / Self Care | Payer: Medicare HMO | Attending: Emergency Medicine | Admitting: Emergency Medicine

## 2018-04-13 ENCOUNTER — Encounter (HOSPITAL_COMMUNITY): Payer: Self-pay | Admitting: Emergency Medicine

## 2018-04-13 DIAGNOSIS — Z794 Long term (current) use of insulin: Secondary | ICD-10-CM | POA: Diagnosis not present

## 2018-04-13 DIAGNOSIS — Z23 Encounter for immunization: Secondary | ICD-10-CM | POA: Diagnosis not present

## 2018-04-13 DIAGNOSIS — Z79899 Other long term (current) drug therapy: Secondary | ICD-10-CM | POA: Diagnosis not present

## 2018-04-13 DIAGNOSIS — J449 Chronic obstructive pulmonary disease, unspecified: Secondary | ICD-10-CM | POA: Insufficient documentation

## 2018-04-13 DIAGNOSIS — Z87891 Personal history of nicotine dependence: Secondary | ICD-10-CM | POA: Insufficient documentation

## 2018-04-13 DIAGNOSIS — I2510 Atherosclerotic heart disease of native coronary artery without angina pectoris: Secondary | ICD-10-CM | POA: Insufficient documentation

## 2018-04-13 DIAGNOSIS — I5042 Chronic combined systolic (congestive) and diastolic (congestive) heart failure: Secondary | ICD-10-CM | POA: Diagnosis not present

## 2018-04-13 DIAGNOSIS — Z7901 Long term (current) use of anticoagulants: Secondary | ICD-10-CM | POA: Insufficient documentation

## 2018-04-13 DIAGNOSIS — E1122 Type 2 diabetes mellitus with diabetic chronic kidney disease: Secondary | ICD-10-CM | POA: Diagnosis not present

## 2018-04-13 DIAGNOSIS — L03115 Cellulitis of right lower limb: Secondary | ICD-10-CM | POA: Diagnosis not present

## 2018-04-13 DIAGNOSIS — I130 Hypertensive heart and chronic kidney disease with heart failure and stage 1 through stage 4 chronic kidney disease, or unspecified chronic kidney disease: Secondary | ICD-10-CM | POA: Diagnosis not present

## 2018-04-13 DIAGNOSIS — E039 Hypothyroidism, unspecified: Secondary | ICD-10-CM | POA: Insufficient documentation

## 2018-04-13 DIAGNOSIS — R944 Abnormal results of kidney function studies: Secondary | ICD-10-CM | POA: Insufficient documentation

## 2018-04-13 DIAGNOSIS — N289 Disorder of kidney and ureter, unspecified: Secondary | ICD-10-CM

## 2018-04-13 DIAGNOSIS — N183 Chronic kidney disease, stage 3 (moderate): Secondary | ICD-10-CM | POA: Insufficient documentation

## 2018-04-13 LAB — URINALYSIS, ROUTINE W REFLEX MICROSCOPIC
Bilirubin Urine: NEGATIVE
Glucose, UA: >=500 mg/dL — AB
Ketones, ur: NEGATIVE mg/dL
Leukocytes, UA: NEGATIVE
Nitrite: NEGATIVE
Protein, ur: NEGATIVE mg/dL
Specific Gravity, Urine: 1.009 (ref 1.005–1.030)
pH: 5 (ref 5.0–8.0)

## 2018-04-13 LAB — CBC WITH DIFFERENTIAL/PLATELET
ABS IMMATURE GRANULOCYTES: 0.1 10*3/uL (ref 0.0–0.1)
Basophils Absolute: 0.1 10*3/uL (ref 0.0–0.1)
Basophils Relative: 0 %
EOS PCT: 3 %
Eosinophils Absolute: 0.4 10*3/uL (ref 0.0–0.7)
HEMATOCRIT: 36.8 % (ref 36.0–46.0)
HEMOGLOBIN: 11.6 g/dL — AB (ref 12.0–15.0)
Immature Granulocytes: 1 %
LYMPHS ABS: 1.9 10*3/uL (ref 0.7–4.0)
Lymphocytes Relative: 15 %
MCH: 29.4 pg (ref 26.0–34.0)
MCHC: 31.5 g/dL (ref 30.0–36.0)
MCV: 93.4 fL (ref 78.0–100.0)
MONO ABS: 1.1 10*3/uL — AB (ref 0.1–1.0)
Monocytes Relative: 9 %
NEUTROS ABS: 9 10*3/uL — AB (ref 1.7–7.7)
Neutrophils Relative %: 72 %
Platelets: 291 10*3/uL (ref 150–400)
RBC: 3.94 MIL/uL (ref 3.87–5.11)
RDW: 14.2 % (ref 11.5–15.5)
WBC: 12.4 10*3/uL — ABNORMAL HIGH (ref 4.0–10.5)

## 2018-04-13 LAB — COMPREHENSIVE METABOLIC PANEL
ALBUMIN: 3.3 g/dL — AB (ref 3.5–5.0)
ALT: 23 U/L (ref 14–54)
ANION GAP: 14 (ref 5–15)
AST: 19 U/L (ref 15–41)
Alkaline Phosphatase: 162 U/L — ABNORMAL HIGH (ref 38–126)
BUN: 58 mg/dL — AB (ref 6–20)
CHLORIDE: 98 mmol/L — AB (ref 101–111)
CO2: 28 mmol/L (ref 22–32)
Calcium: 9 mg/dL (ref 8.9–10.3)
Creatinine, Ser: 1.75 mg/dL — ABNORMAL HIGH (ref 0.44–1.00)
GFR calc Af Amer: 37 mL/min — ABNORMAL LOW (ref >60)
GFR calc non Af Amer: 32 mL/min — ABNORMAL LOW (ref >60)
GLUCOSE: 297 mg/dL — AB (ref 65–99)
POTASSIUM: 5 mmol/L (ref 3.5–5.1)
SODIUM: 140 mmol/L (ref 135–145)
Total Bilirubin: 0.6 mg/dL (ref 0.3–1.2)
Total Protein: 7.3 g/dL (ref 6.5–8.1)

## 2018-04-13 LAB — CBG MONITORING, ED: GLUCOSE-CAPILLARY: 296 mg/dL — AB (ref 65–99)

## 2018-04-13 LAB — I-STAT CG4 LACTIC ACID, ED: LACTIC ACID, VENOUS: 1.82 mmol/L (ref 0.5–1.9)

## 2018-04-13 MED ORDER — TETANUS-DIPHTH-ACELL PERTUSSIS 5-2.5-18.5 LF-MCG/0.5 IM SUSP
0.5000 mL | Freq: Once | INTRAMUSCULAR | Status: AC
  Administered 2018-04-13: 0.5 mL via INTRAMUSCULAR
  Filled 2018-04-13: qty 0.5

## 2018-04-13 MED ORDER — SODIUM CHLORIDE 0.9 % IV BOLUS
500.0000 mL | Freq: Once | INTRAVENOUS | Status: AC
  Administered 2018-04-13: 500 mL via INTRAVENOUS

## 2018-04-13 MED ORDER — ALBUTEROL SULFATE HFA 108 (90 BASE) MCG/ACT IN AERS
1.0000 | INHALATION_SPRAY | Freq: Four times a day (QID) | RESPIRATORY_TRACT | Status: DC
  Administered 2018-04-13: 1 via RESPIRATORY_TRACT
  Filled 2018-04-13: qty 6.7

## 2018-04-13 NOTE — ED Triage Notes (Signed)
Patient states she was called by her PCP and told to come to emergency department for concern about her kidney function based on recent labwork. Patient alert and oriented and in no apparent distress at this time.

## 2018-04-13 NOTE — ED Provider Notes (Signed)
[facility]ERGENC[facility] DEPARTMENT Provider Note   CSN: [id number] Arrival date & time: 04/13/18  1204     History   Chief Complaint Chief Complaint  Patient presents with  . Acute Renal Failure    HPI [name] is a 57 y.o. female.  HPI   Patient is a 31 old female with a history of type 2 diabetes mellitus, CKD stage III, CAD, alcohol use, alcoholic cirrhosis, COPD presenting for abnormal labs.  Patient reports she was called by her cardiology office due to elevated kidney function and concerned that she needed to present to the emergency department.  Patient reports that she may have seen decreased urination over the past week, but has not had cessation of urination, change in energy, fevers, chills, fluid losses and vomiting, diarrhea, or dehydration.  Patient reports she is otherwise feeling normal.  Patient reports that she did have some increased lower extremity edema and erythema after she was scratched by her dog last week.  Patient reports that she has chronic kidney disease, but is not followed by a nephrologist.  Past Medical History:  Diagnosis Date  . Alcohol abuse   . Alcoholic cirrhosis ([place])   . Anemia   . Anxiety   . B12 deficiency   . CAD (coronary artery disease)    a. cath 11/10/2014 LM nl, LAD min irregs, D1 30 ost, D2 50d, LCX 19m, OM1 80 p/m (1.5 mm vessel), OM2 79m, RCA nondom 30m-->med rx.. Demand ischemia in the setting of rapid a-fib.  . Cardiomyopathy ([place])   . Carotid artery disease ([place])    a. 01/2015 Carotid Angio: RICA 144, LICA 31V. s/p L carotid endarterectomy 02/2015.  [name] Chronic combined systolic and diastolic CHF (congestive heart failure) ([place])    a. Last echo 12/205: EF 40-45%, inferoapical/posterior HK, not technically sufficient to allow eval of LV diastolic function, normal LA in size..  . Cocaine abuse ([place])   . COPD (chronic obstructive pulmonary disease) ([place])   . Critical lower limb ischemia   . Depression   .  Diabetic peripheral neuropathy ([place])   . Elevated troponin    a. Chronic elevation.  [name] GERD (gastroesophageal reflux disease)   . Hyperlipemia   . Hypertension   . Hypokalemia   . Hypomagnesemia   . Hypothyroidism   . Marijuana abuse   . Narcotic abuse ([place])   . Noncompliance   . NSVT (nonsustained ventricular tachycardia) ([place])   . Obesity   . PAF (paroxysmal atrial fibrillation) ([place])   . Paroxysmal atrial tachycardia ([place])   . Peripheral arterial disease ([place])    a. 01/2015 Angio/PTA: LSFA 100 w/ recon @ adductor canal and 1 vessel runoff via AT, RSFA 99 (atherectomy/pta) - 1 vessel runoff via diff dzs peroneal.  . Poorly controlled type 2 diabetes mellitus ([place])   . Renal insufficiency    a. 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  [name] Symptomatic bradycardia    avoid AV blocking agent per EP  . Tobacco abuse     Patient Active Problem List   Diagnosis Date Noted  . Coagulation disorder ([place]) 08/09/2017  . Depression 07/21/2017  . At risk for adverse drug reaction 06/20/2017  . Peripheral neuropathy 06/20/2017  . Acute osteomyelitis of right foot ([place]) 06/13/2017  . S/P transmetatarsal amputation of foot, right ([place]) 06/05/2017  . 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 PAD (peripheral artery disease) ([place])   . Sepsis due to other  etiology ([place]) 04/06/2017  . CKD (chronic kidney disease), stage III ([place]) 11/24/2016  . Suspected sleep apnea 11/24/2016  . Severe obesity (BMI >= 40) ([place]) 02/24/2016  . COPD GOLD 0  02/24/2016  . Chronic systolic CHF (congestive heart failure) ([place]) 02/12/2016  . Encounter for therapeutic drug monitoring 02/10/2016  . Symptomatic bradycardia 01/12/2016  . Essential hypertension 12/22/2015  . Wheeze   . 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 Tobacco abuse 10/23/2015  . Coronary artery disease   . DOE (dyspnea on exertion) 04/29/2015  . PAF (paroxysmal atrial fibrillation) ([place])  01/16/2015  . Carotid arterial disease ([place]) 01/16/2015  . Claudication ([place]) 01/15/2015  . Demand ischemia ([place]) 10/29/2014  . Insomnia 02/03/2014  . S/P peripheral artery angioplasty - [place] atherectomy; R SFA 09/11/2013    Class: Acute  . Leg pain, bilateral 08/19/2013  . Hypothyroidism 07/31/2013  . Cellulitis 06/13/2013  . History of cocaine abuse 06/13/2013  . Long term current use of anticoagulant therapy 05/20/2013  . Alcohol abuse   . Narcotic abuse ([employer])   . Marijuana abuse   . Alcoholic cirrhosis ([place])   . DM (diabetes mellitus), type 2 with peripheral vascular complications [facility])     Past Surgical History:  Procedure Laterality Date  . AMPUTATION Right 06/14/2017   Procedure: Right foot transmetatarsal amputation;  Surgeon: Newt Minion, MD;  Location: [facility];  Service: Orthopedics;  Laterality: Right;  . AMPUTATION TOE Right 04/28/2017   Procedure: AMPUTATION OF RIGHT SECOND RAY;  Surgeon: Newt Minion, MD;  Location: [facility];  Service: Orthopedics;  Laterality: Right;  . BALLOON ANGIOPLASTY, ARTERY Right 01/15/2015   SFA/notes 01/15/2015  . CARDIAC CATHETERIZATION    . CARDIAC CATHETERIZATION N/A 01/12/2016   Procedure: Temporary Wire;  Surgeon: Minus Breeding, MD;  Location: [facility] CV LAB;  Service: Cardiovascular;  Laterality: N/A;  . CARDIOVERSION  ~ 02/2013   "twice"   . CAROTID ANGIOGRAM N/A 01/15/2015   Procedure: CAROTID ANGIOGRAM;  Surgeon: [name], MD;  Location: [name] CATH LAB;  Service: Cardiovascular;  Laterality: N/A;  . DILATION AND CURETTAGE OF UTERUS  1988  . ENDARTERECTOMY Left 02/19/2015   Procedure: LEFT CAROTID ENDARTERECTOMY ;  Surgeon: Serafina Mitchell, MD;  Location: [facility];  Service: Vascular;  Laterality: Left;  . LEFT HEART CATHETERIZATION WITH CORONARY ANGIOGRAM N/A 10/31/2014   Procedure: LEFT HEART CATHETERIZATION WITH CORONARY ANGIOGRAM;  Surgeon: Burnell Blanks, MD;  Location: [facility] CATH LAB;  Service: Cardiovascular;   Laterality: N/A;  . LOWER EXTREMITY ANGIOGRAM N/A 09/10/2013   Procedure: LOWER EXTREMITY ANGIOGRAM;  Surgeon: [name], MD;  Location: [facility] CATH LAB;  Service: Cardiovascular;  Laterality: N/A;  . LOWER EXTREMITY ANGIOGRAM N/A 01/15/2015   Procedure: LOWER EXTREMITY ANGIOGRAM;  Surgeon: [name], MD;  [facility] C[facility] LAB;  Service: Cardiovascular;  Laterality: N/A;  . LOWER EXTREMITY ANGIOGRAPHY N/A 04/13/2017   Procedure: Lower Extremity Angiography;  Surgeon: [name], MD;  Location: [facility] CV LAB;  Service: Cardiovascular;  Laterality: N/A;  . PERIPHERAL ATHRECTOMY Right 01/15/2015   SFA/notes 01/15/2015  . PERIPHERAL VASCULAR INTERVENTION  04/13/2017   Procedure: Peripheral Vascular Intervention;  Surgeon: [name], MD;  Location: [facility] CV LAB;  Service: Cardiovascular;;     OB History    Gravida  1   Para  1   Term  1   Preterm      AB      Living  1  SAB      TAB      Ectopic      Multiple      Live Births               Home Medications    Prior to Admission medications   Medication Sig Start Date End Date Taking? Authorizing Provider  acetaminophen (TYLENOL) 325 MG tablet Take 650 mg by mouth every 6 (six) hours as needed for mild pain or moderate pain.     [provider]  albuterol (PROVENTIL HFA;VENTOLIN HFA) 108 (90 Base) MCG/ACT inhaler Inhale 2 puffs into the lungs every 6 (six) hours as needed for wheezing or shortness of breath. 08/09/17   Medina-Vargas, Monina C, NP  Amino Acids-Protein Hydrolys (FEEDING SUPPLEMENT, PRO-STAT SUGAR FREE 64,) LIQD Take 30 mLs by mouth 2 (two) times daily. Patient not taking: Reported on 04/04/2018 06/16/17   Maryellen Pile, MD  amiodarone (PACERONE) 100 MG tablet Take 100 mg by mouth daily.    [provider]  amLODipine (NORVASC) 10 MG tablet Take 1 tablet (10 mg total) by mouth daily. 08/09/17   Medina-Vargas, Monina C, NP  apixaban (ELIQUIS) 5 MG TABS tablet  Take 1 tablet (5 mg total) by mouth 2 (two) times daily. 08/09/17   Medina-Vargas, Monina C, NP  atorvastatin (LIPITOR) 40 MG tablet TAKE 1 TABLET BY MOUTH ONCE EVERY EVENING Patient taking differently: Take 40 mg by mouth daily.  08/09/17   Medina-Vargas, Monina C, NP  bisoprolol (ZEBETA) 5 MG tablet Take 1 tablet (5 mg total) by mouth daily. 03/27/18   Larey Dresser, MD  Buprenorphine HCl (BELBUCA) 150 MCG FILM Place 150 mcg inside cheek every 12 (twelve) hours.    [provider]  calcium carbonate (TUMS - DOSED IN MG ELEMENTAL CALCIUM) 500 MG chewable tablet Chew 2 tablets by mouth every 6 (six) hours as needed for indigestion or heartburn.     [provider]  cholecalciferol (VITAMIN D) 1000 units tablet Take 1,000 Units by mouth daily.    [provider]  clopidogrel (PLAVIX) 75 MG tablet Take 1 tablet (75 mg total) by mouth daily with breakfast. 08/09/17   Medina-Vargas, Monina C, NP  cyanocobalamin 1000 MCG tablet Take 1 tablet (1,000 mcg total) by mouth daily. Patient not taking: Reported on 04/04/2018 05/11/16   Lyda Jester M, PA-C  docusate sodium (COLACE) 100 MG capsule Take 1 capsule (100 mg total) by mouth 2 (two) times daily. Patient not taking: Reported on 04/04/2018 06/16/17   Maryellen Pile, MD  DULoxetine (CYMBALTA) 60 MG capsule Take 1 capsule (60 mg total) by mouth daily. 08/09/17   Medina-Vargas, Monina C, NP  folic acid (FOLVITE) 1 MG tablet TAKE 1 TABLET BY MOUTH EVERY DAY Patient not taking: Reported on 04/04/2018 11/30/16   [name], MD  insulin aspart (NOVOLOG) 100 UNIT/ML injection Inject 30 Units into the skin 3 (three) times daily before meals. 08/09/17   Medina-Vargas, Monina C, NP  Insulin Glargine (LANTUS SOLOSTAR) 100 UNIT/ML Solostar Pen Inject 64 Units into the skin daily. Daily at 8PM Patient taking differently: Inject 71 Units into the skin at bedtime. Daily at [facility] 08/09/17   Medina-Vargas, Monina C, NP  levothyroxine  (SYNTHROID, LEVOTHROID) 50 MCG tablet Take 1 tablet (50 mcg total) by mouth daily. 08/09/17   Medina-Vargas, Monina C, NP  losartan (COZAAR) 25 MG tablet Take 1 tablet (25 mg total) by mouth daily. Patient not taking: Reported on 04/04/2018 08/09/17   Medina-Vargas,  Monina C, NP  metolazone (ZAROXOLYN) 2.5 MG tablet Take 2.5 mg by mouth every Thursday.     [provider]  potassium chloride SA (K-DUR,KLOR-CON) 20 MEQ tablet Take 1 tablet (20 mEq total) by mouth daily. Take an addition 20 meq with metolazone weekly 03/27/18   Larey Dresser, MD  pregabalin (LYRICA) 75 MG capsule Take 75 mg by mouth daily.    [provider]  spironolactone (ALDACTONE) 25 MG tablet Take 25 mg by mouth daily.    [provider]  SYMBICORT 160-4.5 MCG/ACT inhaler Inhale 2 puffs into the lungs 2 (two) times daily. 08/09/17   Medina-Vargas, Monina C, NP  torsemide (DEMADEX) 20 MG tablet Take 2 tablets (40 mg total) by mouth daily. 04/11/18   Larey Dresser, MD  traZODone (DESYREL) 100 MG tablet Take 1 tablet (100 mg total) by mouth at bedtime. 08/09/17   Medina-Vargas, Senaida Lange, NP    Family History Family History  Problem Relation Age of Onset  . Hypertension Mother   . Diabetes Mother   . Cancer Mother        breast, ovarian, colon  . Clotting disorder Mother   . Heart disease Mother   . Heart attack Mother   . Breast cancer Mother        in 97's  . Hypertension Father   . Heart disease Father   . Emphysema Sister        smoked    Social History Social History   Tobacco Use  . Smoking status: Former Smoker    Packs/day: 0.00    Years: 40.00    Pack years: 0.00    Types: E-cigarettes    Last attempt to quit: 04/05/2017    Years since quitting: 1.0  . Smokeless tobacco: Former Systems developer    Types: Snuff  Substance Use Topics  . Alcohol use: No    Alcohol/week: 0.0 oz    Comment: former alcohol abuse  . Drug use: Yes    Types: "Crack" cocaine, Marijuana, Oxycodone     Comment: 04/29/2015 "last drug use was ~ 09/08/2013"     Allergies   Gabapentin and Lyrica [pregabalin]   Review of Systems Review of Systems  Constitutional: Negative for chills and fever.  HENT: Negative for congestion and rhinorrhea.   Respiratory: Positive for wheezing. Negative for chest tightness and shortness of breath.   Cardiovascular: Positive for leg swelling. Negative for chest pain.  Gastrointestinal: Negative for abdominal pain, diarrhea, nausea and vomiting.  Genitourinary: Negative for decreased urine volume and dysuria.  Musculoskeletal: Negative for arthralgias and myalgias.  Neurological: Negative for weakness and numbness.  All other systems reviewed and are negative.    Physical Exam Updated Vital Signs BP (!) 158/80 (BP Location: Right Arm)   Pulse 80   Temp 97.7 F (36.5 C) (Oral)   Resp 16   Ht 5\' 4"  (1.626 m)   Wt 114.3 kg (252 lb)   LMP 11/27/2012   SpO2 96%   BMI 43.26 kg/m   Physical Exam  Constitutional: She appears well-developed and well-nourished. No distress.  HENT:  Head: Normocephalic and atraumatic.  Mouth/Throat: Oropharynx is clear and moist.  Eyes: Pupils are equal, round, and reactive to light. Conjunctivae and EOM are normal.  Neck: Normal range of motion. Neck supple. No JVD present.  Cardiovascular: Normal rate, regular rhythm, S1 normal and S2 normal.  No murmur heard. Pulmonary/Chest: Effort normal. She has wheezes. She has no rales.  Soft end expiratory  wheezes in bilateral lung bases.  Abdominal: Soft. There is no tenderness. There is no guarding.  Protuberant, but no shifting dullness.  Musculoskeletal: Normal range of motion. She exhibits edema.  Symmetric swelling of bilateral lower extremities, with erythema overlying seeping ulcers. All toes amputated, and no evidence of dehiscence over surgical sites of the right lower extremity.  Neurological: She is alert.  Cranial nerves grossly intact. Patient moves  extremities symmetrically and with good coordination.  Skin: Skin is warm and dry. No rash noted. No erythema.  Psychiatric: She has a normal mood and affect. Her behavior is normal. Judgment and thought content normal.  Nursing note and vitals reviewed.    ED Treatments / Results  Labs (all labs ordered are listed, but only abnormal results are displayed) Labs Reviewed  COMPREHENSIVE METABOLIC PANEL - Abnormal; Notable for the following components:      Result Value   Chloride 98 (*)    Glucose, Bld 297 (*)    BUN 58 (*)    Creatinine, Ser 1.75 (*)    Albumin 3.3 (*)    Alkaline Phosphatase 162 (*)    GFR calc non Af Amer 32 (*)    GFR calc Af Amer 37 (*)    All other components within normal limits  CBC WITH DIFFERENTIAL/PLATELET - Abnormal; Notable for the following components:   WBC 12.4 (*)    Hemoglobin 11.6 (*)    Neutro Abs 9.0 (*)    Monocytes Absolute 1.1 (*)    All other components within normal limits  URINALYSIS, ROUTINE W REFLEX MICROSCOPIC - Abnormal; Notable for the following components:   Color, Urine STRAW (*)    Glucose, UA >=500 (*)    Hgb urine dipstick SMALL (*)    Bacteria, UA RARE (*)    All other components within normal limits  CBG MONITORING, ED - Abnormal; Notable for the following components:   Glucose-Capillary 296 (*)    All other components within normal limits  I-STAT CG4 LACTIC ACID, ED    EKG None  Radiology No results found.  Procedures Procedures (including critical care time)  Medications Ordered in ED Medications  sodium chloride 0.9 % bolus 500 mL (has no administration in time range)  albuterol (PROVENTIL HFA;VENTOLIN HFA) 108 (90 Base) MCG/ACT inhaler 1 puff (has no administration in time range)  Tdap (BOOSTRIX) injection 0.5 mL (has no administration in time range)     Initial Impression / Assessment and Plan / ED Course  I have reviewed the triage vital signs and the nursing notes.  Pertinent labs & imaging  results that were available during my care of the patient were reviewed by me and considered in my medical decision making (see chart for details).  Clinical Course as of Apr 14 58  Sat Apr 14, 2018  0053 Patient reassessed.  Patient feeling well, and ready to go home.  Went over discharge instructions including close follow-up with primary care provider for insulin management as well as referral to nephrologist.  Patient is in understanding of this plan.   [AM]    Clinical Course User Index [AM] Albesa Seen, PA-C    Patient is nontoxic-appearing and in no acute distress.  Patient presents per recommendation of cardiology office after worsening kidney function and hyperkalemia of 6.1 noted on lab work yesterday.  Today potassium is 5.0 and kidney function is unchanged.  Feel that this may reflect hemolysis.  Given that patient's kidney function is only slightly worse than baseline,  and she is making urine and is not in fluid overload, do not feel that admission for acute renal failure warranted today.  Given elevated glucose, and elevated BUN/creatinine ratio, will proceed with 500 mL of normal saline, providing cautious fluid repletion as patient has a reduced ejection fraction of 40 to 45%.  Patient to be referred back to primary care provider for insulin management as well as  to arrange nephrology follow-up.  Will provide recommendation and for referral to patient.  Patient also exhibits swelling of lower extremities, and some redness surrounding a scratch site produced by her dog.  Patient exhibits leukocytosis. May be related to hyperglycemia-related demargination or infection. Will cover with antibiotics as this may be early cellulitis. Patient not meeting SIRS criteria.   Labowork otherwise remarkable for slightly elevated alkaline phosphatase, chronic.  Patient has upcoming appointment with primary care provider and instructed to follow up regarding repeat of kidney function,  nephrology referral, and insulin management.  All questions answered by patient.  Final Clinical Impressions(s) / ED Diagnoses   Final diagnoses:  Abnormal kidney function  Cellulitis of right lower extremity    ED Discharge Orders        Ordered    doxycycline (VIBRAMYCIN) 100 MG capsule  2 times daily     04/14/18 0109       Albesa Seen, PA-C 04/14/18 0322    Little, Wenda [place], MD 04/18/18 1500

## 2018-04-14 MED ORDER — DOXYCYCLINE HYCLATE 100 MG PO CAPS: 100.0000 mg | ORAL_CAPSULE | Freq: Two times a day (BID) | ORAL | 0 refills | Status: AC

## 2018-04-14 MED ORDER — DOXYCYCLINE HYCLATE 100 MG PO TABS
100.0000 mg | ORAL_TABLET | Freq: Once | ORAL | Status: AC
  Administered 2018-04-14: 100 mg via ORAL
  Filled 2018-04-14: qty 1

## 2018-04-14 MED ORDER — ACETAMINOPHEN 325 MG PO TABS
650.0000 mg | ORAL_TABLET | Freq: Once | ORAL | Status: AC
  Administered 2018-04-14: 650 mg via ORAL
  Filled 2018-04-14: qty 2

## 2018-04-14 NOTE — Discharge Instructions (Signed)
Please see the information and instructions below regarding your visit.  Your diagnoses today include:  1. Abnormal kidney function   2. Cellulitis of right lower extremity    Your lab work is reassuring today.  Your kidney function is slightly above your baseline, but your potassium is normal.  I feel that the laboratory analysis from yesterday was in error.  Your legs look like they may be developing cellulitis, which is a superficial skin infection, particularly around the area that you were scratched by your dog.  We did update your tetanus shot today.  Tests performed today include: See side panel of your discharge paperwork for testing performed today. Vital signs are listed at the bottom of these instructions.   Medications prescribed:    Take any prescribed medications only as prescribed, and any over the counter medications only as directed on the packaging.  Doxycycline is an antibiotic that fights infection in the . This medication can make your skin sensitive to the sun, so please ensure that you wear sunscreen, hats, or other coverage over your skin while taking this. This medicine CANNOT be taken by women while pregnant, breastfeeding, or trying to become pregnant.  Please speak with a healthcare provider if any of these situations apply to you.   Home care instructions:  Please follow any educational materials contained in this packet.   Please elevate your legs when you are not ambulating.  Please call back to Dr. Kennon Holter office if you have not heard anything by the middle of next week regarding home health care wound changes.  Follow-up instructions: Please follow-up with your primary care provider in one week.  Return instructions:  Please return to the Emergency Department if you experience worsening symptoms.  Please return to the emergency department if you develop any inability to make urine, fevers, chills, spreading redness of your legs, worsening drainage of  your legs, chest pain, shortness of breath, or new or worsening symptoms. Please return if you have any other emergent concerns.  Additional Information:   Your vital signs today were: BP (!) 158/77    Pulse 74    Temp 97.7 F (36.5 C) (Oral)    Resp 18    Ht 5\' 4"  (1.626 m)    Wt 114.3 kg (252 lb)    LMP 11/27/2012    SpO2 98%    BMI 43.26 kg/m  If your blood pressure (BP) was elevated on multiple readings during this visit above 130 for the top number or above 80 for the bottom number, please have this repeated by your primary care provider within one month. --------------  Thank you for allowing [place] to participate in your care today.

## 2018-04-17 ENCOUNTER — Telehealth (HOSPITAL_COMMUNITY): Payer: Self-pay | Admitting: *Deleted

## 2018-04-17 NOTE — Telephone Encounter (Signed)
HHRN called to get VO for skilled nursing to see pt 3 times a week for 4 weeks with 2 prn visits, gave VO per Dr Aundra Dubin.  She also ask about orders for leg wounds, advised she should contact Dr Kennon Holter office for this as he is seeing pt for PAD and trying to get her scheduled for a LE angiogram.

## 2018-05-01 ENCOUNTER — Ambulatory Visit (HOSPITAL_COMMUNITY)
Admission: RE | Admit: 2018-05-01 | Discharge: 2018-05-01 | Disposition: A | Discharge status: Home / Self Care | Payer: Medicare HMO | Source: Ambulatory Visit | Attending: Cardiology | Admitting: Cardiology

## 2018-05-01 ENCOUNTER — Encounter (HOSPITAL_COMMUNITY): Payer: Medicare HMO

## 2018-05-01 ENCOUNTER — Encounter (HOSPITAL_COMMUNITY): Payer: Self-pay

## 2018-05-01 VITALS — BP 136/92 | HR 110 | Wt 248.4 lb

## 2018-05-01 DIAGNOSIS — G4733 Obstructive sleep apnea (adult) (pediatric): Secondary | ICD-10-CM | POA: Insufficient documentation

## 2018-05-01 DIAGNOSIS — N183 Chronic kidney disease, stage 3 unspecified: Secondary | ICD-10-CM

## 2018-05-01 DIAGNOSIS — Z794 Long term (current) use of insulin: Secondary | ICD-10-CM | POA: Insufficient documentation

## 2018-05-01 DIAGNOSIS — I480 Paroxysmal atrial fibrillation: Secondary | ICD-10-CM | POA: Insufficient documentation

## 2018-05-01 DIAGNOSIS — Z79890 Hormone replacement therapy (postmenopausal): Secondary | ICD-10-CM | POA: Diagnosis not present

## 2018-05-01 DIAGNOSIS — E1151 Type 2 diabetes mellitus with diabetic peripheral angiopathy without gangrene: Secondary | ICD-10-CM | POA: Diagnosis not present

## 2018-05-01 DIAGNOSIS — Z8249 Family history of ischemic heart disease and other diseases of the circulatory system: Secondary | ICD-10-CM | POA: Diagnosis not present

## 2018-05-01 DIAGNOSIS — Z9981 Dependence on supplemental oxygen: Secondary | ICD-10-CM | POA: Diagnosis not present

## 2018-05-01 DIAGNOSIS — Z720 Tobacco use: Secondary | ICD-10-CM

## 2018-05-01 DIAGNOSIS — I2510 Atherosclerotic heart disease of native coronary artery without angina pectoris: Secondary | ICD-10-CM | POA: Diagnosis not present

## 2018-05-01 DIAGNOSIS — K7460 Unspecified cirrhosis of liver: Secondary | ICD-10-CM | POA: Diagnosis not present

## 2018-05-01 DIAGNOSIS — Z87891 Personal history of nicotine dependence: Secondary | ICD-10-CM | POA: Insufficient documentation

## 2018-05-01 DIAGNOSIS — J449 Chronic obstructive pulmonary disease, unspecified: Secondary | ICD-10-CM | POA: Insufficient documentation

## 2018-05-01 DIAGNOSIS — I6521 Occlusion and stenosis of right carotid artery: Secondary | ICD-10-CM | POA: Diagnosis not present

## 2018-05-01 DIAGNOSIS — E039 Hypothyroidism, unspecified: Secondary | ICD-10-CM | POA: Diagnosis not present

## 2018-05-01 DIAGNOSIS — Z79899 Other long term (current) drug therapy: Secondary | ICD-10-CM | POA: Diagnosis not present

## 2018-05-01 DIAGNOSIS — I10 Essential (primary) hypertension: Secondary | ICD-10-CM

## 2018-05-01 DIAGNOSIS — I130 Hypertensive heart and chronic kidney disease with heart failure and stage 1 through stage 4 chronic kidney disease, or unspecified chronic kidney disease: Secondary | ICD-10-CM | POA: Diagnosis not present

## 2018-05-01 DIAGNOSIS — E875 Hyperkalemia: Secondary | ICD-10-CM | POA: Insufficient documentation

## 2018-05-01 DIAGNOSIS — R001 Bradycardia, unspecified: Secondary | ICD-10-CM | POA: Insufficient documentation

## 2018-05-01 DIAGNOSIS — I5022 Chronic systolic (congestive) heart failure: Secondary | ICD-10-CM | POA: Insufficient documentation

## 2018-05-01 DIAGNOSIS — E1122 Type 2 diabetes mellitus with diabetic chronic kidney disease: Secondary | ICD-10-CM | POA: Diagnosis not present

## 2018-05-01 DIAGNOSIS — Z7902 Long term (current) use of antithrombotics/antiplatelets: Secondary | ICD-10-CM | POA: Diagnosis not present

## 2018-05-01 DIAGNOSIS — Z7901 Long term (current) use of anticoagulants: Secondary | ICD-10-CM | POA: Diagnosis not present

## 2018-05-01 DIAGNOSIS — Z7951 Long term (current) use of inhaled steroids: Secondary | ICD-10-CM | POA: Insufficient documentation

## 2018-05-01 LAB — BASIC METABOLIC PANEL
Anion gap: 9 (ref 5–15)
BUN: 54 mg/dL — AB (ref 6–20)
CALCIUM: 9.8 mg/dL (ref 8.9–10.3)
CO2: 32 mmol/L (ref 22–32)
CREATININE: 1.74 mg/dL — AB (ref 0.44–1.00)
Chloride: 99 mmol/L — ABNORMAL LOW (ref 101–111)
GFR calc Af Amer: 37 mL/min — ABNORMAL LOW (ref >60)
GFR, EST NON AFRICAN AMERICAN: 32 mL/min — AB (ref >60)
Glucose, Bld: 100 mg/dL — ABNORMAL HIGH (ref 65–99)
POTASSIUM: 5.5 mmol/L — AB (ref 3.5–5.1)
SODIUM: 140 mmol/L (ref 135–145)

## 2018-05-01 MED ORDER — BISOPROLOL FUMARATE 5 MG PO TABS: 7.5000 mg | ORAL_TABLET | Freq: Every day | ORAL | 11 refills | Status: DC

## 2018-05-01 NOTE — Progress Notes (Signed)
Patient ID: [name], female   DOB: 1961-09-16, 57 y.o.   MRN: [id number]    Advanced Heart Failure Clinic Note   PCP: Dr Roxy Manns HF Cardiology: Dr. Aundra Dubin PV: Dr. Gwenlyn Found  Ms Milhoan is a 57 year old with history of PAD, carotid stenosis s/p left CEA, relatively mild CAD, chronic systolic CHF, paroxysmal atrial fibrillation and prior substance abuse presents for CHF clinic followup.  She was admitted in 1/17 with acute hypoxemic respiratory failure in the setting of atrial fibrillation/RVR and volume overload.  She was initially intubated.  She converted back to NSR with amiodarone gtt.  She was treated with IV Lasix, steroids, bronchodilators.  She developed AKI and losartan was stopped.    She was admitted in 3/17 with symptomatic bradycardia.  She was supposed to stop diltiazem after this admission but continued to take it.  She presented later in 3/17, again with symptomatic bradycardia (junctional bradycardia), hypotension, and AKI.  Diltiazem and bisoprolol were stopped.  HR recovered and she has had no problems since.  ACEI was stopped with AKI.   In 5/18, she had peripheral angiography with atherectomy of right SFA.  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 7/18 ABIs showed 0.65 on right, 0.31 on left.     She presents today for follow up. Overall feeling fine. Has ongoing leg pain. SOB with exertion. Denies PND/orthopnea. Limited mobility due to leg pain. Appetite ok. No fever or chills. She is not weighing. Taking all medications. She has trouble paying for food and often gets food from a food bank.   Labs (1/17): K 5.2, creatinine 1.4, AST 43, ALT normal Labs (2/17): K 4.2, creatinine 1.3, BNP 607, TSH normal Labs (3/17): K 4.2, creatinine 1.45, AST/ALT normal, HCT 28.7 Labs (4/17): K 4.4, creatinine 1.61 Labs (08/31/2016): K 4.5 Creatinine 1.43  Labs (11/17): K 4.2, creatinine 1.4 Labs (9/18): K 4.1, creatinine 1.1, TSH  normal Labs 04/13/2018: K 5 Creatinine 1.75  PMH: 1. Carotid stenosis: Known occluded right carotid.  Left CEA in 4/16.  2. CAD: LHC in 12/15 with 80% stenosis in small OM1, nonobstructive disease in other territories.  3. Chronic systolic CHF: Nonischemic cardiomyopathy (?due to ETOH or prior drug abuse).  - Echo (1/17) with EF 45%, mild LV hypertrophy, moderate diastolic dysfunction, inferolateral severe hypokinesis, mildly decreased RV systolic function.  - Echo (1/18): EF 40-45%, mild LVH, normal RV size and systolic function.  4. Atrial fibrillation: Paroxysmal.   5. Type II diabetes 6. CKD: Stage III. 7. COPD: Quit smoking 1/17. On home oxygen.  8. 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  No longer drinks.  9. Hypothyroidism 10. PAD: Atherectomy SFA in 2014 (Dr Gwenlyn Found).  Peripheral arterial dopplers (2/17) with focal 75-99% proximal right SFA stenosis, occluded mid-distal right SFA, chronic occlusion of all runoff arteries on right. - In 5/18, she had peripheral angiography with atherectomy of right SFA.   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  - 7/18 ABIs showed 0.65 on right, 0.31 on left.   11. Anemia 12. Prior cocaine abuse.  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   SH: Lives with sister.  Prior cocaine abuse.  Prior ETOH abuse.  Quit smoking in 1/17, restarted, and quit again in 4/19.  FH: CAD  Review of systems complete and found to be negative unless listed in HPI.    Current  Outpatient Medications  Medication Sig Dispense Refill  . acetaminophen (TYLENOL) 325 MG tablet Take 650 mg by mouth every 6 (six) hours as needed for mild pain or moderate pain.     [name] albuterol (PROVENTIL HFA;VENTOLIN HFA) 108 (90 Base) MCG/ACT inhaler Inhale 2 puffs into the lungs every 6 (six) hours as needed for wheezing or shortness of breath. 18 g 0  . Amino Acids-Protein Hydrolys (FEEDING SUPPLEMENT,  PRO-STAT SUGAR FREE 64,) LIQD Take 30 mLs by mouth 2 (two) times daily. 900 mL 0  . amiodarone (PACERONE) 100 MG tablet Take 100 mg by mouth daily.    [name] amLODipine (NORVASC) 10 MG tablet Take 1 tablet (10 mg total) by mouth daily. 30 tablet 0  . apixaban (ELIQUIS) 5 MG TABS tablet Take 1 tablet (5 mg total) by mouth 2 (two) times daily. 60 tablet 0  . atorvastatin (LIPITOR) 40 MG tablet TAKE 1 TABLET BY MOUTH ONCE EVERY EVENING (Patient taking differently: Take 40 mg by mouth daily. ) 30 tablet 0  . bisoprolol (ZEBETA) 5 MG tablet Take 1 tablet (5 mg total) by mouth daily. 30 tablet 3  . Buprenorphine HCl (BELBUCA) 150 MCG FILM Place 150 mcg inside cheek every 12 (twelve) hours.    . calcium carbonate (TUMS - DOSED IN MG ELEMENTAL CALCIUM) 500 MG chewable tablet Chew 2 tablets by mouth every 6 (six) hours as needed for indigestion or heartburn.     . cholecalciferol (VITAMIN D) 1000 units tablet Take 1,000 Units by mouth daily.    . clopidogrel (PLAVIX) 75 MG tablet Take 1 tablet (75 mg total) by mouth daily with breakfast. 30 tablet 0  . cyanocobalamin 1000 MCG tablet Take 1 tablet (1,000 mcg total) by mouth daily. 30 tablet 10  . docusate sodium (COLACE) 100 MG capsule Take 1 capsule (100 mg total) by mouth 2 (two) times daily. 10 capsule 0  . DULoxetine (CYMBALTA) 60 MG capsule Take 1 capsule (60 mg total) by mouth daily. 30 capsule 0  . folic acid (FOLVITE) 1 MG tablet TAKE 1 TABLET BY MOUTH EVERY DAY 90 tablet 1  . insulin aspart (NOVOLOG) 100 UNIT/ML injection Inject 30 Units into the skin 3 (three) times daily before meals. 30 mL 0  . Insulin Glargine (LANTUS SOLOSTAR) 100 UNIT/ML Solostar Pen Inject 64 Units into the skin daily. Daily at 8PM (Patient taking differently: Inject 72 Units into the skin at bedtime. Daily at 8PM) 15 mL 0  . levothyroxine (SYNTHROID, LEVOTHROID) 50 MCG tablet Take 1 tablet (50 mcg total) by mouth daily. 30 tablet 0  . losartan (COZAAR) 25 MG tablet Take 1 tablet  (25 mg total) by mouth daily. 30 tablet 0  . metolazone (ZAROXOLYN) 2.5 MG tablet Take 2.5 mg by mouth every Thursday.     . potassium chloride SA (K-DUR,KLOR-CON) 20 MEQ tablet Take 1 tablet (20 mEq total) by mouth daily. Take an addition 20 meq with metolazone weekly 35 tablet 3  . pregabalin (LYRICA) 75 MG capsule Take 75 mg by mouth daily.    [name] spironolactone (ALDACTONE) 25 MG tablet Take 25 mg by mouth daily.    . SYMBICORT 160-4.5 MCG/ACT inhaler Inhale 2 puffs into the lungs 2 (two) times daily. 10.2 g 0  . torsemide (DEMADEX) 20 MG tablet Take 2 tablets (40 mg total) by mouth daily. 90 tablet 3  . traZODone (DESYREL) 100 MG tablet Take 1 tablet (100 mg total) by mouth at bedtime. 30 tablet 0   No current  facility-administered medications for this encounter.    Vitals:   05/01/18 1440  BP: (!) 136/92  Pulse: (!) 110  SpO2: 94%  Weight: 248 lb 6.4 oz (112.7 kg)     Wt Readings from Last 3 Encounters:  05/01/18 248 lb 6.4 oz (112.7 kg)  04/13/18 252 lb (114.3 kg)  04/11/18 252 lb 6.4 oz (114.5 kg)    Physical Exam  General:  Appears chronically ill. No resp difficulty HEENT: normal Neck: supple. no JVD. Carotids 2+ bilat; no bruits. No lymphadenopathy or thryomegaly appreciated. Cor: PMI nondisplaced. Regular rate & rhythm. No rubs, gallops or murmurs. Lungs: EW throughout on room air.  Abdomen:obese,  soft, nontender, nondistended. No hepatosplenomegaly. No bruits or masses. Good bowel sounds. Extremities: no cyanosis, clubbing, rash, edema Neuro: alert & orientedx3, cranial nerves grossly intact. moves all 4 extremities w/o difficulty. Affect pleasant  EKG Sinus Tach 105 bpm   Assessment/Plan: 1. Chronic systolic CHF: EF 30-86% on last echo. Has been presumed nonischemic given LHC in 12/15 showing only 80% stenosis in small OM1.   .- NYHA IIIb. Volume status stable. Continue torsemide 40 mg daily.  - Continue metolazone 2.5 mg Thursdays  - Stop potassium with recent  hyperkalemia    - Increase bisoprolol to 7.5 mg daily. Has been intolerant of coreg.  - Continue losartan 25 mg daily.     - Continue spironolactone 25 mg daily.  2. Atrial fibrillation: Paroxysmal.   - Continue amiodarone 100 mg daily. LFTs stable earlier this month. PCP following hypothyroidism (on Levoxyl).   Needs regular eye exams. - In Sinus Tach today.  - On coumadin per coumadin clinic 3. PAD: Claudication on left seems limiting.  No ulcerations.  She has foot pain almost constantly.  Not candidate for cilostazol with CHF.  She has been following with Dr. Gwenlyn Found. 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  - She has followup with Dr. Gwenlyn Found.  - Peripheral angiography deferred with AKI.  - Check BMET today.   4. COPD: - Continues to smoke.   5. H/o cirrhosis:  - Likely from ETOH, Now abstinent.  6. OSA: - Unable to tolerate CPAP.  7. CKD stage III:   - BMET today.  8. HTN:  - Meds as above.  9. Bradycardia:  - Junctional rhythm when hospitalized in 3/17. - Stable.  10. Hyperkalemia Recent potassium elevated.  Stop potassium .  - Check BMET today.   Follow up in 3 months. Refer back to HF Paramedicine.   Darrick Grinder, NP  05/01/2018

## 2018-05-01 NOTE — Patient Instructions (Addendum)
EKG today.  STOP Potassium.  INCREASE Bisoprolol to 7.5 mg (1.5 tabs) once daily.  Routine lab work today. Will notify you of abnormal results, otherwise no news is good news!  Follow up 3 months.  ______________________________________________________________________________ [name] Code:  Take all medication as prescribed the day of your appointment. Bring all medications with you to your appointment.  Do the following things EVERYDAY: 1) Weigh yourself in the morning before breakfast. Write it down and keep it in a log. 2) Take your medicines as prescribed 3) Eat low salt foods-Limit salt (sodium) to 2000 mg per day.  4) Stay as active as you can everyday 5) Limit all fluids for the day to less than 2 liters

## 2018-05-11 ENCOUNTER — Encounter (HOSPITAL_COMMUNITY): Payer: Self-pay

## 2018-05-11 ENCOUNTER — Telehealth (HOSPITAL_COMMUNITY): Payer: Self-pay

## 2018-05-11 NOTE — Telephone Encounter (Signed)
Result Notes for Basic metabolic panel   Notes recorded by Effie Berkshire, RN on 05/11/2018 at 1:35 PM EDT 4th attempt unsuccessful to reach patient by phone. Letter mailed. ------  Notes recorded by Kerry Dory, CMA on 05/11/2018 at 11:07 AM EDT Left message for patient to call back.  ------  Notes recorded by Kerry Dory, CMA on 05/08/2018 at 1:45 PM EDT Left message for patient to call back.[phone] (H)   ------  Notes recorded by Kerry Dory, CMA on 05/03/2018 at 4:08 PM EDT Unable to reach patient. Will attempt later  [phone] (H) ------  Notes recorded by Conrad Torboy, NP on 05/01/2018 at 5:11 PM EDT Repeat BMET next week. Potassium was stopped today. Please call and verify. Instruct avoid high potassium.

## 2018-05-16 ENCOUNTER — Other Ambulatory Visit: Payer: Self-pay | Admitting: Internal Medicine

## 2018-06-18 ENCOUNTER — Other Ambulatory Visit: Payer: Self-pay | Admitting: Cardiology

## 2018-06-18 DIAGNOSIS — I480 Paroxysmal atrial fibrillation: Secondary | ICD-10-CM

## 2018-06-26 ENCOUNTER — Encounter (HOSPITAL_BASED_OUTPATIENT_CLINIC_OR_DEPARTMENT_OTHER): Payer: Medicare HMO | Attending: Internal Medicine

## 2018-06-26 ENCOUNTER — Other Ambulatory Visit (HOSPITAL_COMMUNITY)
Admission: RE | Admit: 2018-06-26 | Discharge: 2018-06-26 | Disposition: A | Discharge status: Home / Self Care | Payer: Medicare HMO | Source: Other Acute Inpatient Hospital | Attending: Physician Assistant | Admitting: Physician Assistant

## 2018-06-26 DIAGNOSIS — J449 Chronic obstructive pulmonary disease, unspecified: Secondary | ICD-10-CM | POA: Diagnosis not present

## 2018-06-26 DIAGNOSIS — I890 Lymphedema, not elsewhere classified: Secondary | ICD-10-CM | POA: Insufficient documentation

## 2018-06-26 DIAGNOSIS — L97822 Non-pressure chronic ulcer of other part of left lower leg with fat layer exposed: Secondary | ICD-10-CM | POA: Diagnosis not present

## 2018-06-26 DIAGNOSIS — F17210 Nicotine dependence, cigarettes, uncomplicated: Secondary | ICD-10-CM | POA: Insufficient documentation

## 2018-06-26 DIAGNOSIS — G4730 Sleep apnea, unspecified: Secondary | ICD-10-CM | POA: Insufficient documentation

## 2018-06-26 DIAGNOSIS — E1142 Type 2 diabetes mellitus with diabetic polyneuropathy: Secondary | ICD-10-CM | POA: Diagnosis not present

## 2018-06-26 DIAGNOSIS — E1151 Type 2 diabetes mellitus with diabetic peripheral angiopathy without gangrene: Secondary | ICD-10-CM | POA: Insufficient documentation

## 2018-06-26 DIAGNOSIS — I2510 Atherosclerotic heart disease of native coronary artery without angina pectoris: Secondary | ICD-10-CM | POA: Insufficient documentation

## 2018-06-26 DIAGNOSIS — Z794 Long term (current) use of insulin: Secondary | ICD-10-CM | POA: Diagnosis not present

## 2018-06-26 DIAGNOSIS — L97812 Non-pressure chronic ulcer of other part of right lower leg with fat layer exposed: Secondary | ICD-10-CM | POA: Diagnosis present

## 2018-06-26 DIAGNOSIS — N183 Chronic kidney disease, stage 3 (moderate): Secondary | ICD-10-CM | POA: Diagnosis not present

## 2018-06-26 DIAGNOSIS — L97522 Non-pressure chronic ulcer of other part of left foot with fat layer exposed: Secondary | ICD-10-CM | POA: Diagnosis not present

## 2018-06-26 DIAGNOSIS — L97422 Non-pressure chronic ulcer of left heel and midfoot with fat layer exposed: Secondary | ICD-10-CM | POA: Diagnosis not present

## 2018-06-26 DIAGNOSIS — K7030 Alcoholic cirrhosis of liver without ascites: Secondary | ICD-10-CM | POA: Insufficient documentation

## 2018-06-26 DIAGNOSIS — E1122 Type 2 diabetes mellitus with diabetic chronic kidney disease: Secondary | ICD-10-CM | POA: Diagnosis not present

## 2018-06-30 LAB — AEROBIC CULTURE W GRAM STAIN (SUPERFICIAL SPECIMEN)

## 2018-06-30 LAB — AEROBIC CULTURE  (SUPERFICIAL SPECIMEN)

## 2018-07-05 DIAGNOSIS — L97812 Non-pressure chronic ulcer of other part of right lower leg with fat layer exposed: Secondary | ICD-10-CM | POA: Diagnosis not present

## 2018-07-05 LAB — GLUCOSE, CAPILLARY
Glucose-Capillary: 66 mg/dL — ABNORMAL LOW (ref 70–99)
Glucose-Capillary: 125 mg/dL — ABNORMAL HIGH (ref 70–99)

## 2018-07-13 DIAGNOSIS — L97812 Non-pressure chronic ulcer of other part of right lower leg with fat layer exposed: Secondary | ICD-10-CM | POA: Diagnosis not present

## 2018-07-19 ENCOUNTER — Other Ambulatory Visit (HOSPITAL_COMMUNITY): Payer: Self-pay | Admitting: Cardiology

## 2018-07-20 ENCOUNTER — Encounter (HOSPITAL_BASED_OUTPATIENT_CLINIC_OR_DEPARTMENT_OTHER): Payer: Medicare HMO | Attending: Internal Medicine

## 2018-07-20 DIAGNOSIS — L97522 Non-pressure chronic ulcer of other part of left foot with fat layer exposed: Secondary | ICD-10-CM | POA: Insufficient documentation

## 2018-07-20 DIAGNOSIS — L97822 Non-pressure chronic ulcer of other part of left lower leg with fat layer exposed: Secondary | ICD-10-CM | POA: Insufficient documentation

## 2018-07-20 DIAGNOSIS — F17210 Nicotine dependence, cigarettes, uncomplicated: Secondary | ICD-10-CM | POA: Insufficient documentation

## 2018-07-20 DIAGNOSIS — J449 Chronic obstructive pulmonary disease, unspecified: Secondary | ICD-10-CM | POA: Insufficient documentation

## 2018-07-20 DIAGNOSIS — I110 Hypertensive heart disease with heart failure: Secondary | ICD-10-CM | POA: Insufficient documentation

## 2018-07-20 DIAGNOSIS — L97422 Non-pressure chronic ulcer of left heel and midfoot with fat layer exposed: Secondary | ICD-10-CM | POA: Insufficient documentation

## 2018-07-20 DIAGNOSIS — K7460 Unspecified cirrhosis of liver: Secondary | ICD-10-CM | POA: Insufficient documentation

## 2018-07-20 DIAGNOSIS — I509 Heart failure, unspecified: Secondary | ICD-10-CM | POA: Insufficient documentation

## 2018-07-20 DIAGNOSIS — E11621 Type 2 diabetes mellitus with foot ulcer: Secondary | ICD-10-CM | POA: Insufficient documentation

## 2018-07-20 DIAGNOSIS — E1140 Type 2 diabetes mellitus with diabetic neuropathy, unspecified: Secondary | ICD-10-CM | POA: Insufficient documentation

## 2018-07-20 DIAGNOSIS — G4730 Sleep apnea, unspecified: Secondary | ICD-10-CM | POA: Insufficient documentation

## 2018-07-20 DIAGNOSIS — I890 Lymphedema, not elsewhere classified: Secondary | ICD-10-CM | POA: Insufficient documentation

## 2018-07-20 DIAGNOSIS — I2510 Atherosclerotic heart disease of native coronary artery without angina pectoris: Secondary | ICD-10-CM | POA: Insufficient documentation

## 2018-07-20 DIAGNOSIS — E1151 Type 2 diabetes mellitus with diabetic peripheral angiopathy without gangrene: Secondary | ICD-10-CM | POA: Insufficient documentation

## 2018-07-27 DIAGNOSIS — J449 Chronic obstructive pulmonary disease, unspecified: Secondary | ICD-10-CM | POA: Diagnosis not present

## 2018-07-27 DIAGNOSIS — I890 Lymphedema, not elsewhere classified: Secondary | ICD-10-CM | POA: Diagnosis not present

## 2018-07-27 DIAGNOSIS — K7460 Unspecified cirrhosis of liver: Secondary | ICD-10-CM | POA: Diagnosis not present

## 2018-07-27 DIAGNOSIS — G4730 Sleep apnea, unspecified: Secondary | ICD-10-CM | POA: Diagnosis not present

## 2018-07-27 DIAGNOSIS — E1140 Type 2 diabetes mellitus with diabetic neuropathy, unspecified: Secondary | ICD-10-CM | POA: Diagnosis not present

## 2018-07-27 DIAGNOSIS — F17210 Nicotine dependence, cigarettes, uncomplicated: Secondary | ICD-10-CM | POA: Diagnosis not present

## 2018-07-27 DIAGNOSIS — I2510 Atherosclerotic heart disease of native coronary artery without angina pectoris: Secondary | ICD-10-CM | POA: Diagnosis not present

## 2018-07-27 DIAGNOSIS — I509 Heart failure, unspecified: Secondary | ICD-10-CM | POA: Diagnosis not present

## 2018-07-27 DIAGNOSIS — L97822 Non-pressure chronic ulcer of other part of left lower leg with fat layer exposed: Secondary | ICD-10-CM | POA: Diagnosis not present

## 2018-07-27 DIAGNOSIS — L97522 Non-pressure chronic ulcer of other part of left foot with fat layer exposed: Secondary | ICD-10-CM | POA: Diagnosis not present

## 2018-07-27 DIAGNOSIS — E1151 Type 2 diabetes mellitus with diabetic peripheral angiopathy without gangrene: Secondary | ICD-10-CM | POA: Diagnosis not present

## 2018-07-27 DIAGNOSIS — L97422 Non-pressure chronic ulcer of left heel and midfoot with fat layer exposed: Secondary | ICD-10-CM | POA: Diagnosis not present

## 2018-07-27 DIAGNOSIS — E11621 Type 2 diabetes mellitus with foot ulcer: Secondary | ICD-10-CM | POA: Diagnosis present

## 2018-07-27 DIAGNOSIS — I110 Hypertensive heart disease with heart failure: Secondary | ICD-10-CM | POA: Diagnosis not present

## 2018-07-30 ENCOUNTER — Inpatient Hospital Stay (HOSPITAL_COMMUNITY): Admission: RE | Admit: 2018-07-30 | Payer: Medicare HMO | Source: Ambulatory Visit

## 2018-08-08 ENCOUNTER — Inpatient Hospital Stay (HOSPITAL_COMMUNITY)
Admission: RE | Admit: 2018-08-08 | Discharge: 2018-08-08 | Disposition: A | Discharge status: Home / Self Care | Payer: Medicare HMO | Source: Ambulatory Visit

## 2018-08-17 ENCOUNTER — Observation Stay (HOSPITAL_COMMUNITY)
Admission: EM | Admit: 2018-08-17 | Discharge: 2018-08-19 | Disposition: A | Discharge status: Home / Self Care | Payer: Medicare HMO | Attending: Internal Medicine | Admitting: Internal Medicine

## 2018-08-17 ENCOUNTER — Other Ambulatory Visit: Payer: Self-pay

## 2018-08-17 DIAGNOSIS — I480 Paroxysmal atrial fibrillation: Secondary | ICD-10-CM | POA: Diagnosis not present

## 2018-08-17 DIAGNOSIS — M79605 Pain in left leg: Secondary | ICD-10-CM | POA: Insufficient documentation

## 2018-08-17 DIAGNOSIS — E785 Hyperlipidemia, unspecified: Secondary | ICD-10-CM | POA: Diagnosis not present

## 2018-08-17 DIAGNOSIS — Z89421 Acquired absence of other right toe(s): Secondary | ICD-10-CM | POA: Insufficient documentation

## 2018-08-17 DIAGNOSIS — Z794 Long term (current) use of insulin: Secondary | ICD-10-CM | POA: Insufficient documentation

## 2018-08-17 DIAGNOSIS — Z89411 Acquired absence of right great toe: Secondary | ICD-10-CM | POA: Insufficient documentation

## 2018-08-17 DIAGNOSIS — F1010 Alcohol abuse, uncomplicated: Secondary | ICD-10-CM | POA: Diagnosis present

## 2018-08-17 DIAGNOSIS — F419 Anxiety disorder, unspecified: Secondary | ICD-10-CM | POA: Insufficient documentation

## 2018-08-17 DIAGNOSIS — N183 Chronic kidney disease, stage 3 unspecified: Secondary | ICD-10-CM | POA: Diagnosis present

## 2018-08-17 DIAGNOSIS — E1142 Type 2 diabetes mellitus with diabetic polyneuropathy: Secondary | ICD-10-CM | POA: Diagnosis not present

## 2018-08-17 DIAGNOSIS — G8929 Other chronic pain: Secondary | ICD-10-CM | POA: Diagnosis not present

## 2018-08-17 DIAGNOSIS — F10920 Alcohol use, unspecified with intoxication, uncomplicated: Secondary | ICD-10-CM

## 2018-08-17 DIAGNOSIS — Y906 Blood alcohol level of 120-199 mg/100 ml: Secondary | ICD-10-CM | POA: Insufficient documentation

## 2018-08-17 DIAGNOSIS — Z888 Allergy status to other drugs, medicaments and biological substances status: Secondary | ICD-10-CM | POA: Insufficient documentation

## 2018-08-17 DIAGNOSIS — I429 Cardiomyopathy, unspecified: Secondary | ICD-10-CM | POA: Insufficient documentation

## 2018-08-17 DIAGNOSIS — I471 Supraventricular tachycardia: Secondary | ICD-10-CM | POA: Diagnosis not present

## 2018-08-17 DIAGNOSIS — E1151 Type 2 diabetes mellitus with diabetic peripheral angiopathy without gangrene: Secondary | ICD-10-CM | POA: Insufficient documentation

## 2018-08-17 DIAGNOSIS — J449 Chronic obstructive pulmonary disease, unspecified: Secondary | ICD-10-CM | POA: Diagnosis not present

## 2018-08-17 DIAGNOSIS — F10129 Alcohol abuse with intoxication, unspecified: Secondary | ICD-10-CM | POA: Diagnosis not present

## 2018-08-17 DIAGNOSIS — Z7901 Long term (current) use of anticoagulants: Secondary | ICD-10-CM | POA: Insufficient documentation

## 2018-08-17 DIAGNOSIS — E538 Deficiency of other specified B group vitamins: Secondary | ICD-10-CM | POA: Diagnosis not present

## 2018-08-17 DIAGNOSIS — E1122 Type 2 diabetes mellitus with diabetic chronic kidney disease: Secondary | ICD-10-CM | POA: Insufficient documentation

## 2018-08-17 DIAGNOSIS — Z8249 Family history of ischemic heart disease and other diseases of the circulatory system: Secondary | ICD-10-CM | POA: Insufficient documentation

## 2018-08-17 DIAGNOSIS — R062 Wheezing: Secondary | ICD-10-CM

## 2018-08-17 DIAGNOSIS — I10 Essential (primary) hypertension: Secondary | ICD-10-CM | POA: Diagnosis present

## 2018-08-17 DIAGNOSIS — I5042 Chronic combined systolic (congestive) and diastolic (congestive) heart failure: Secondary | ICD-10-CM | POA: Diagnosis not present

## 2018-08-17 DIAGNOSIS — R52 Pain, unspecified: Secondary | ICD-10-CM

## 2018-08-17 DIAGNOSIS — F1410 Cocaine abuse, uncomplicated: Secondary | ICD-10-CM | POA: Diagnosis not present

## 2018-08-17 DIAGNOSIS — N179 Acute kidney failure, unspecified: Secondary | ICD-10-CM | POA: Diagnosis not present

## 2018-08-17 DIAGNOSIS — E039 Hypothyroidism, unspecified: Secondary | ICD-10-CM | POA: Diagnosis not present

## 2018-08-17 DIAGNOSIS — F329 Major depressive disorder, single episode, unspecified: Secondary | ICD-10-CM | POA: Insufficient documentation

## 2018-08-17 DIAGNOSIS — K7030 Alcoholic cirrhosis of liver without ascites: Secondary | ICD-10-CM | POA: Insufficient documentation

## 2018-08-17 DIAGNOSIS — I130 Hypertensive heart and chronic kidney disease with heart failure and stage 1 through stage 4 chronic kidney disease, or unspecified chronic kidney disease: Secondary | ICD-10-CM | POA: Insufficient documentation

## 2018-08-17 DIAGNOSIS — E119 Type 2 diabetes mellitus without complications: Secondary | ICD-10-CM | POA: Diagnosis present

## 2018-08-17 DIAGNOSIS — E1159 Type 2 diabetes mellitus with other circulatory complications: Secondary | ICD-10-CM | POA: Diagnosis present

## 2018-08-17 DIAGNOSIS — K219 Gastro-esophageal reflux disease without esophagitis: Secondary | ICD-10-CM | POA: Insufficient documentation

## 2018-08-17 DIAGNOSIS — E1169 Type 2 diabetes mellitus with other specified complication: Secondary | ICD-10-CM | POA: Diagnosis present

## 2018-08-17 DIAGNOSIS — I2510 Atherosclerotic heart disease of native coronary artery without angina pectoris: Secondary | ICD-10-CM | POA: Diagnosis not present

## 2018-08-17 DIAGNOSIS — Z79899 Other long term (current) drug therapy: Secondary | ICD-10-CM | POA: Insufficient documentation

## 2018-08-17 DIAGNOSIS — Z87891 Personal history of nicotine dependence: Secondary | ICD-10-CM | POA: Insufficient documentation

## 2018-08-17 DIAGNOSIS — I152 Hypertension secondary to endocrine disorders: Secondary | ICD-10-CM | POA: Diagnosis present

## 2018-08-17 DIAGNOSIS — F1411 Cocaine abuse, in remission: Secondary | ICD-10-CM | POA: Diagnosis present

## 2018-08-17 LAB — CBG MONITORING, ED: GLUCOSE-CAPILLARY: 340 mg/dL — AB (ref 70–99)

## 2018-08-17 NOTE — ED Provider Notes (Signed)
[facility]ERG[facility]Y DEPARTMENT Provider Note   CSN: [id number] Arrival date & time: 08/17/18  2141     History   Chief Complaint Chief Complaint  Patient presents with  . Alcohol Intoxication    HPI YOSHI VICENCIO is a 57 y.o. female.  The history is provided by the patient and medical records.     57 y.o. F with hx of alcohol abuse, cirrhosis, anemia, B12 deficiency, CAD, cocaine abuse, COPD, depression, GERD, HLP, hypothyroidism, PAF, renal insufficiency, presenting to the ED for alcohol intoxication.  Patient reports she was at a restaurant with friends and "overdid it".  Reportedly she consumed approximately 2 pitchers of margaritas and 2 shots of tequila.  States now she just feels very tired and sleepy.  No vomiting reported.  Denies pain.  Past Medical History:  Diagnosis Date  . Alcohol abuse   . Alcoholic cirrhosis ([place])   . Anemia   . Anxiety   . B12 deficiency   . CAD (coronary artery disease)    a. cath 11/10/2014 LM nl, LAD min irregs, D1 30 ost, D2 50d, LCX 71m, OM1 80 p/m (1.5 mm vessel), OM2 45m, RCA nondom 31m-->med rx.. Demand ischemia in the setting of rapid a-fib.  . Cardiomyopathy ([place])   . Carotid artery disease ([place])    a. 01/2015 Carotid Angio: RICA 268, LICA 34H. s/p L carotid endarterectomy 02/2015.  [name] Chronic combined systolic and diastolic CHF (congestive heart failure) ([place])    a. Last echo 12/205: EF 40-45%, inferoapical/posterior HK, not technically sufficient to allow eval of LV diastolic function, normal LA in size..  . Cocaine abuse ([place])   . COPD (chronic obstructive pulmonary disease) ([place])   . Critical lower limb ischemia   . Depression   . Diabetic peripheral neuropathy ([place])   . Elevated troponin    a. Chronic elevation.  [name] GERD (gastroesophageal reflux disease)   . Hyperlipemia   . Hypertension   . Hypokalemia   . Hypomagnesemia   . Hypothyroidism   . Marijuana abuse   . Narcotic abuse ([place])   .  Noncompliance   . NSVT (nonsustained ventricular tachycardia) ([place])   . Obesity   . PAF (paroxysmal atrial fibrillation) ([place])   . Paroxysmal atrial tachycardia ([place])   . Peripheral arterial disease ([place])    a. 01/2015 Angio/PTA: LSFA 100 w/ recon @ adductor canal and 1 vessel runoff via AT, RSFA 99 (atherectomy/pta) - 1 vessel runoff via diff dzs peroneal.  . Poorly controlled type 2 diabetes mellitus ([place])   . Renal insufficiency    a. 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  [name] Symptomatic bradycardia    avoid AV blocking agent per EP  . Tobacco abuse     Patient Active Problem List   Diagnosis Date Noted  . Coagulation disorder ([place]) 08/09/2017  . Depression 07/21/2017  . At risk for adverse drug reaction 06/20/2017  . Peripheral neuropathy 06/20/2017  . Acute osteomyelitis of right foot ([place]) 06/13/2017  . S/P transmetatarsal amputation of foot, right ([place]) 06/05/2017  . 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 PAD (peripheral artery disease) ([place])   . Sepsis due to other etiology ([place]) 04/06/2017  . CKD (chronic kidney disease), stage III ([place]) 11/24/2016  . Suspected sleep apnea 11/24/2016  . Severe obesity (BMI >= 40) ([place]) 02/24/2016  . COPD GOLD 0  02/24/2016  . Chronic systolic CHF (congestive heart failure) ([place]) 02/12/2016  . Encounter for  therapeutic drug monitoring 02/10/2016  . Symptomatic bradycardia 01/12/2016  . Essential hypertension 12/22/2015  . Wheeze   . 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 Tobacco abuse 10/23/2015  . Coronary artery disease   . DOE (dyspnea on exertion) 04/29/2015  . PAF (paroxysmal atrial fibrillation) ([place]) 01/16/2015  . Carotid arterial disease ([place]) 01/16/2015  . Claudication ([place]) 01/15/2015  . Demand ischemia ([place]) 10/29/2014  . Insomnia 02/03/2014  . S/P peripheral artery angioplasty - [place] atherectomy; R SFA 09/11/2013    Class: Acute  . Leg pain, bilateral  08/19/2013  . Hypothyroidism 07/31/2013  . Cellulitis 06/13/2013  . History of cocaine abuse ([place]) 06/13/2013  . Long term current use of anticoagulant therapy 05/20/2013  . Alcohol abuse   . Narcotic abuse ([place])   . Marijuana abuse   . Alcoholic cirrhosis ([place])   . DM (diabetes mellitus), type 2 with peripheral vascular complications [facility])     Past Surgical History:  Procedure Laterality Date  . AMPUTATION Right 06/14/2017   Procedure: Right foot transmetatarsal amputation;  Surgeon: Newt Minion, MD;  Location: [facility];  Service: Orthopedics;  Laterality: Right;  . AMPUTATION TOE Right 04/28/2017   Procedure: AMPUTATION OF RIGHT SECOND RAY;  Surgeon: Newt Minion, MD;  Location: [facility];  Service: Orthopedics;  Laterality: Right;  . BALLOON ANGIOPLASTY, ARTERY Right 01/15/2015   SFA/notes 01/15/2015  . CARDIAC CATHETERIZATION    . CARDIAC CATHETERIZATION N/A 01/12/2016   Procedure: Temporary Wire;  Surgeon: Minus Breeding, MD;  Location: [facility] CV LAB;  Service: Cardiovascular;  Laterality: N/A;  . CARDIOVERSION  ~ 02/2013   "twice"   . CAROTID ANGIOGRAM N/A 01/15/2015   Procedure: CAROTID ANGIOGRAM;  Surgeon: [name], MD;  Location: [facility] CATH LAB;  Service: Cardiovascular;  Laterality: N/A;  . DILATION AND CURETTAGE OF UTERUS  1988  . ENDARTERECTOMY Left 02/19/2015   Procedure: LEFT CAROTID ENDARTERECTOMY ;  Surgeon: Serafina Mitchell, MD;  Location: [facility];  Service: Vascular;  Laterality: Left;  . LEFT HEART CATHETERIZATION WITH CORONARY ANGIOGRAM N/A 10/31/2014   Procedure: LEFT HEART CATHETERIZATION WITH CORONARY ANGIOGRAM;  Surgeon: Burnell Blanks, MD;  Location: [facility] CATH LAB;  Service: Cardiovascular;  Laterality: N/A;  . LOWER EXTREMITY ANGIOGRAM N/A 09/10/2013   Procedure: LOWER EXTREMITY ANGIOGRAM;  Surgeon: [name], MD;  Location: [facility] CATH LAB;  Service: Cardiovascular;  Laterality: N/A;  . LOWER EXTREMITY ANGIOGRAM N/A 01/15/2015   Procedure: LOWER EXTREMITY  ANGIOGRAM;  Surgeon: [name], MD;  Location: [facility] CATH LAB;  Service: Cardiovascular;  Laterality: N/A;  . LOWER EXTREMITY ANGIOGRAPHY N/A 04/13/2017   Procedure: Lower Extremity Angiography;  Surgeon: [name], MD;  Location: [place] CV LAB;  Service: Cardiovascular;  Laterality: N/A;  . PERIPHERAL ATHRECTOMY Right 01/15/2015   SFA/notes 01/15/2015  . PERIPHERAL VASCULAR INTERVENTION  04/13/2017   Procedure: Peripheral Vascular Intervention;  Surgeon: [name], MD;  Location: [place] CV LAB;  Service: Cardiovascular;;     OB History    Gravida  1   Para  1   Term  1   Preterm      AB      Living  1     SAB      TAB      Ectopic      Multiple      Live Births               Home Medications  Prior to Admission medications   Medication Sig Start Date End Date Taking? Authorizing Provider  acetaminophen (TYLENOL) 325 MG tablet Take 650 mg by mouth every 6 (six) hours as needed for mild pain or moderate pain.     [provider]  albuterol (PROVENTIL HFA;VENTOLIN HFA) 108 (90 Base) MCG/ACT inhaler Inhale 2 puffs into the lungs every 6 (six) hours as needed for wheezing or shortness of breath. 08/09/17   Medina-Vargas, Monina C, NP  Amino Acids-Protein Hydrolys (FEEDING SUPPLEMENT, PRO-STAT SUGAR FREE 64,) LIQD Take 30 mLs by mouth 2 (two) times daily. 06/16/17   Maryellen Pile, MD  amiodarone (PACERONE) 100 MG tablet Take 100 mg by mouth daily.    [provider]  amiodarone (PACERONE) 100 MG tablet TAKE 1 TABLET (100 MG TOTAL) BY MOUTH DAILY 06/20/18   Larey Dresser, MD  amLODipine (NORVASC) 10 MG tablet Take 1 tablet (10 mg total) by mouth daily. 08/09/17   Medina-Vargas, Monina C, NP  apixaban (ELIQUIS) 5 MG TABS tablet Take 1 tablet (5 mg total) by mouth 2 (two) times daily. 08/09/17   Medina-Vargas, Monina C, NP  atorvastatin (LIPITOR) 40 MG tablet TAKE 1 TABLET BY MOUTH ONCE EVERY EVENING Patient taking differently: Take  40 mg by mouth daily.  08/09/17   Medina-Vargas, Monina C, NP  bisoprolol (ZEBETA) 5 MG tablet Take 1.5 tablets (7.5 mg total) by mouth daily. 05/01/18   Clegg, [name] D, NP  Buprenorphine HCl (BELBUCA) 150 MCG FILM Place 150 mcg inside cheek every 12 (twelve) hours.    [provider]  calcium carbonate (TUMS - DOSED IN MG ELEMENTAL CALCIUM) 500 MG chewable tablet Chew 2 tablets by mouth every 6 (six) hours as needed for indigestion or heartburn.     [provider]  cholecalciferol (VITAMIN D) 1000 units tablet Take 1,000 Units by mouth daily.    [provider]  clopidogrel (PLAVIX) 75 MG tablet Take 1 tablet (75 mg total) by mouth daily with breakfast. 08/09/17   Medina-Vargas, Monina C, NP  cyanocobalamin 1000 MCG tablet Take 1 tablet (1,000 mcg total) by mouth daily. 05/11/16   Lyda Jester M, PA-C  docusate sodium (COLACE) 100 MG capsule Take 1 capsule (100 mg total) by mouth 2 (two) times daily. 06/16/17   Maryellen Pile, MD  DULoxetine (CYMBALTA) 60 MG capsule Take 1 capsule (60 mg total) by mouth daily. 08/09/17   Medina-Vargas, Monina C, NP  folic acid (FOLVITE) 1 MG tablet TAKE 1 TABLET BY MOUTH EVERY DAY 11/30/16   [name], MD  insulin aspart (NOVOLOG) 100 UNIT/ML injection Inject 30 Units into the skin 3 (three) times daily before meals. 08/09/17   Medina-Vargas, Monina C, NP  Insulin Glargine (LANTUS SOLOSTAR) 100 UNIT/ML Solostar Pen Inject 64 Units into the skin daily. Daily at 8PM Patient taking differently: Inject 72 Units into the skin at bedtime. Daily at [facility] 08/09/17   Medina-Vargas, Monina C, NP  levothyroxine (SYNTHROID, LEVOTHROID) 50 MCG tablet Take 1 tablet (50 mcg total) by mouth daily. 08/09/17   Medina-Vargas, Monina C, NP  losartan (COZAAR) 25 MG tablet Take 1 tablet (25 mg total) by mouth daily. 08/09/17   Medina-Vargas, Monina C, NP  metolazone (ZAROXOLYN) 2.5 MG tablet Take 2.5 mg by mouth every Thursday.     [provider]    metolazone (ZAROXOLYN) 2.5 MG tablet TAKE 1 TABLE EVERY WEEK ON THURSDAY. MAY TAKE ONE ADDITIONAL TABLET AS DIRECTED BY HEART FAILURE CLINIC 05/16/18   Bensimhon, Quillian Quince  R, MD  pregabalin (LYRICA) 75 MG capsule Take 75 mg by mouth daily.    [provider]  spironolactone (ALDACTONE) 25 MG tablet Take 25 mg by mouth daily.    [provider]  SYMBICORT 160-4.5 MCG/ACT inhaler Inhale 2 puffs into the lungs 2 (two) times daily. 08/09/17   Medina-Vargas, Monina C, NP  torsemide (DEMADEX) 20 MG tablet Take 2 tablets (40 mg total) by mouth daily. 04/11/18   Larey Dresser, MD  traZODone (DESYREL) 100 MG tablet Take 1 tablet (100 mg total) by mouth at bedtime. 08/09/17   Medina-Vargas, Senaida Lange, NP    Family History Family History  Problem Relation Age of Onset  . Hypertension Mother   . Diabetes Mother   . Cancer Mother        breast, ovarian, colon  . Clotting disorder Mother   . Heart disease Mother   . Heart attack Mother   . Breast cancer Mother        in 48's  . Hypertension Father   . Heart disease Father   . Emphysema Sister        smoked    Social History Social History   Tobacco Use  . Smoking status: Former Smoker    Packs/day: 0.00    Years: 40.00    Pack years: 0.00    Types: E-cigarettes    Last attempt to quit: 04/05/2017    Years since quitting: 1.3  . Smokeless tobacco: Former Systems developer    Types: Snuff  Substance Use Topics  . Alcohol use: No    Alcohol/week: 0.0 standard drinks    Comment: former alcohol abuse  . Drug use: Yes    Types: "Crack" cocaine, Marijuana, Oxycodone    Comment: 04/29/2015 "last drug use was ~ 09/08/2013"     Allergies   Gabapentin and Lyrica [pregabalin]   Review of Systems Review of Systems  Constitutional:       EtOH  All other systems reviewed and are negative.    Physical Exam Updated Vital Signs BP (!) 149/63 (BP Location: Right Arm)   Pulse 76   Temp 98 F (36.7 C) (Oral)   Resp 18   LMP  11/27/2012   SpO2 99%   Physical Exam  Constitutional: She appears well-developed and well-nourished.  Sleeping, snoring  HENT:  Head: Normocephalic and atraumatic.  Mouth/Throat: Oropharynx is clear and moist.  Tongue dry, breath smells of EtOH  Eyes: Pupils are equal, round, and reactive to light. Conjunctivae and EOM are normal.  Neck: Normal range of motion.  Cardiovascular: Normal rate, regular rhythm and normal heart sounds.  Pulmonary/Chest: Effort normal. She has wheezes.  Rattling breath sounds with intermittent wheezing; no rales noted  Abdominal: Soft. Bowel sounds are normal.  Musculoskeletal: Normal range of motion.  Partial amputation of right foot  Neurological: She is alert.  Appears drowsy but arouses to verbal and tactile stimuli, can answer questions and follow commands  Skin: Skin is warm and dry.  Psychiatric: She has a normal mood and affect.  Nursing note and vitals reviewed.    ED Treatments / Results  Labs (all labs ordered are listed, but only abnormal results are displayed) Labs Reviewed  CBC WITH DIFFERENTIAL/PLATELET - Abnormal; Notable for the following components:      Result Value   Hemoglobin 11.9 (*)    All other components within normal limits  COMPREHENSIVE METABOLIC PANEL - Abnormal; Notable for the following components:   Chloride 97 (*)  Glucose, Bld 326 (*)    BUN 50 (*)    Creatinine, Ser 2.73 (*)    Albumin 3.0 (*)    Alkaline Phosphatase 143 (*)    GFR calc non Af Amer 18 (*)    GFR calc Af Amer 21 (*)    All other components within normal limits  ETHANOL - Abnormal; Notable for the following components:   Alcohol, Ethyl (B) 130 (*)    All other components within normal limits  BRAIN NATRIURETIC PEPTIDE - Abnormal; Notable for the following components:   B Natriuretic Peptide 291.3 (*)    All other components within normal limits  CBG MONITORING, ED - Abnormal; Notable for the following components:   Glucose-Capillary 340  (*)    All other components within normal limits  RAPID URINE DRUG SCREEN, HOSP PERFORMED  BASIC METABOLIC PANEL  URINALYSIS, ROUTINE W REFLEX MICROSCOPIC  NA AND K (SODIUM & POTASSIUM), RAND UR  CREATININE, URINE, RANDOM  HIV ANTIBODY (ROUTINE TESTING W REFLEX)  HEPARIN LEVEL (UNFRACTIONATED)  APTT  APTT    EKG None  Radiology No results found.  Procedures Procedures (including critical care time)  Medications Ordered in ED Medications - No data to display   Initial Impression / Assessment and Plan / ED Course  I have reviewed the triage vital signs and the nursing notes.  Pertinent labs & imaging results that were available during my care of the patient were reviewed by me and considered in my medical decision making (see chart for details).  57 y.o. F here with alcohol intoxication.  Was out with friends, "overdid it"-- drank 2 pitches of margaritas and 2 dhots of tequila.  Patient drowsy and snoring here but does arouse to verbal and tactile stimuli.  She appears neurologically intact when awake but clinically intoxicated.  No signs of trauma on exam.  Labs will be sent.  Will monitor.  Labs with increase in SrCr-- 2.73 today compared with 1.74 3 months ago.  Patient is on Losartan.  Given IVF.  Will require admission.  Ethanol 130-- patient does have hx of alcohol abuse in the past, relapsing as of recent per chart review.  IVF started.  Discussed with Dr. Alcario Drought-- has added CXR, BNP, requested fluids to be held until resulted.  He will admit for ongoing care.  Final Clinical Impressions(s) / ED Diagnoses   Final diagnoses:  Alcoholic intoxication without complication ([place])  AKI (acute kidney injury) [facility])    ED Discharge Orders    None       Larene Pickett, PA-C 08/18/18 [place]    Orlie Dakin, MD 08/20/18 1143

## 2018-08-17 NOTE — ED Triage Notes (Signed)
Pt to ED via EMS after having two pitchers of margaritas and 2 tequila shots tonight. Per EMS pt is altered, A/O x1 on arrival. Pt 87% on room air with snoring respirations so placed on 4L . Hx diabetes, CBG 326, 400 NS given PTA.

## 2018-08-18 ENCOUNTER — Other Ambulatory Visit: Payer: Self-pay

## 2018-08-18 ENCOUNTER — Encounter (HOSPITAL_COMMUNITY): Payer: Self-pay | Admitting: Emergency Medicine

## 2018-08-18 ENCOUNTER — Emergency Department (HOSPITAL_COMMUNITY): Payer: Medicare HMO

## 2018-08-18 DIAGNOSIS — N183 Chronic kidney disease, stage 3 (moderate): Secondary | ICD-10-CM | POA: Diagnosis not present

## 2018-08-18 DIAGNOSIS — N179 Acute kidney failure, unspecified: Secondary | ICD-10-CM | POA: Diagnosis present

## 2018-08-18 DIAGNOSIS — I10 Essential (primary) hypertension: Secondary | ICD-10-CM

## 2018-08-18 DIAGNOSIS — F10920 Alcohol use, unspecified with intoxication, uncomplicated: Secondary | ICD-10-CM | POA: Diagnosis not present

## 2018-08-18 DIAGNOSIS — I5022 Chronic systolic (congestive) heart failure: Secondary | ICD-10-CM | POA: Diagnosis not present

## 2018-08-18 DIAGNOSIS — I480 Paroxysmal atrial fibrillation: Secondary | ICD-10-CM

## 2018-08-18 DIAGNOSIS — J449 Chronic obstructive pulmonary disease, unspecified: Secondary | ICD-10-CM

## 2018-08-18 DIAGNOSIS — E1151 Type 2 diabetes mellitus with diabetic peripheral angiopathy without gangrene: Secondary | ICD-10-CM

## 2018-08-18 LAB — CBC WITH DIFFERENTIAL/PLATELET
Abs Immature Granulocytes: 0.1 10*3/uL (ref 0.0–0.1)
Basophils Absolute: 0 10*3/uL (ref 0.0–0.1)
Basophils Relative: 0 %
Eosinophils Absolute: 0.3 10*3/uL (ref 0.0–0.7)
Eosinophils Relative: 3 %
HEMATOCRIT: 38.6 % (ref 36.0–46.0)
HEMOGLOBIN: 11.9 g/dL — AB (ref 12.0–15.0)
IMMATURE GRANULOCYTES: 1 %
LYMPHS ABS: 2.1 10*3/uL (ref 0.7–4.0)
Lymphocytes Relative: 20 %
MCH: 29 pg (ref 26.0–34.0)
MCHC: 30.8 g/dL (ref 30.0–36.0)
MCV: 94.1 fL (ref 78.0–100.0)
MONO ABS: 0.9 10*3/uL (ref 0.1–1.0)
Monocytes Relative: 8 %
Neutro Abs: 7.1 10*3/uL (ref 1.7–7.7)
Neutrophils Relative %: 68 %
PLATELETS: 309 10*3/uL (ref 150–400)
RBC: 4.1 MIL/uL (ref 3.87–5.11)
RDW: 14.2 % (ref 11.5–15.5)
WBC: 10.5 10*3/uL (ref 4.0–10.5)

## 2018-08-18 LAB — RAPID URINE DRUG SCREEN, HOSP PERFORMED
Amphetamines: NOT DETECTED
Barbiturates: NOT DETECTED
Benzodiazepines: NOT DETECTED
Cocaine: NOT DETECTED
Opiates: NOT DETECTED
TETRAHYDROCANNABINOL: NOT DETECTED

## 2018-08-18 LAB — COMPREHENSIVE METABOLIC PANEL
ALT: 21 U/L (ref 0–44)
AST: 16 U/L (ref 15–41)
Albumin: 3 g/dL — ABNORMAL LOW (ref 3.5–5.0)
Alkaline Phosphatase: 143 U/L — ABNORMAL HIGH (ref 38–126)
Anion gap: 11 (ref 5–15)
BUN: 50 mg/dL — AB (ref 6–20)
CHLORIDE: 97 mmol/L — AB (ref 98–111)
CO2: 27 mmol/L (ref 22–32)
CREATININE: 2.73 mg/dL — AB (ref 0.44–1.00)
Calcium: 9 mg/dL (ref 8.9–10.3)
GFR calc Af Amer: 21 mL/min — ABNORMAL LOW (ref >60)
GFR, EST NON AFRICAN AMERICAN: 18 mL/min — AB (ref >60)
GLUCOSE: 326 mg/dL — AB (ref 70–99)
Potassium: 4 mmol/L (ref 3.5–5.1)
Sodium: 135 mmol/L (ref 135–145)
Total Bilirubin: 0.4 mg/dL (ref 0.3–1.2)
Total Protein: 6.7 g/dL (ref 6.5–8.1)

## 2018-08-18 LAB — URINALYSIS, ROUTINE W REFLEX MICROSCOPIC
Bilirubin Urine: NEGATIVE
Glucose, UA: 50 mg/dL — AB
Ketones, ur: NEGATIVE mg/dL
Leukocytes, UA: NEGATIVE
Nitrite: NEGATIVE
PROTEIN: NEGATIVE mg/dL
SPECIFIC GRAVITY, URINE: 1.011 (ref 1.005–1.030)
pH: 5 (ref 5.0–8.0)

## 2018-08-18 LAB — GLUCOSE, CAPILLARY
GLUCOSE-CAPILLARY: 183 mg/dL — AB (ref 70–99)
GLUCOSE-CAPILLARY: 197 mg/dL — AB (ref 70–99)
Glucose-Capillary: 260 mg/dL — ABNORMAL HIGH (ref 70–99)
Glucose-Capillary: 152 mg/dL — ABNORMAL HIGH (ref 70–99)

## 2018-08-18 LAB — BASIC METABOLIC PANEL
ANION GAP: 8 (ref 5–15)
Anion gap: 13 (ref 5–15)
BUN: 48 mg/dL — ABNORMAL HIGH (ref 6–20)
BUN: 43 mg/dL — ABNORMAL HIGH (ref 6–20)
CHLORIDE: 101 mmol/L (ref 98–111)
CO2: 27 mmol/L (ref 22–32)
CO2: 28 mmol/L (ref 22–32)
CREATININE: 2.51 mg/dL — AB (ref 0.44–1.00)
Calcium: 9.1 mg/dL (ref 8.9–10.3)
Calcium: 8.9 mg/dL (ref 8.9–10.3)
Chloride: 97 mmol/L — ABNORMAL LOW (ref 98–111)
Creatinine, Ser: 2.27 mg/dL — ABNORMAL HIGH (ref 0.44–1.00)
GFR calc Af Amer: 24 mL/min — ABNORMAL LOW (ref >60)
GFR calc Af Amer: 27 mL/min — ABNORMAL LOW (ref >60)
GFR calc non Af Amer: 23 mL/min — ABNORMAL LOW (ref >60)
GFR, EST NON AFRICAN AMERICAN: 20 mL/min — AB (ref >60)
GLUCOSE: 280 mg/dL — AB (ref 70–99)
Glucose, Bld: 145 mg/dL — ABNORMAL HIGH (ref 70–99)
POTASSIUM: 4.2 mmol/L (ref 3.5–5.1)
POTASSIUM: 4.3 mmol/L (ref 3.5–5.1)
SODIUM: 137 mmol/L (ref 135–145)
SODIUM: 137 mmol/L (ref 135–145)

## 2018-08-18 LAB — ETHANOL: ALCOHOL ETHYL (B): 130 mg/dL — AB (ref <10)

## 2018-08-18 LAB — CREATININE, URINE, RANDOM: CREATININE, URINE: 72.6 mg/dL

## 2018-08-18 LAB — NA AND K (SODIUM & POTASSIUM), RAND UR
Potassium Urine: 22 mmol/L
SODIUM UR: 43 mmol/L

## 2018-08-18 LAB — APTT
aPTT: 38 seconds — ABNORMAL HIGH (ref 24–36)
aPTT: 43 seconds — ABNORMAL HIGH (ref 24–36)

## 2018-08-18 LAB — BRAIN NATRIURETIC PEPTIDE: B NATRIURETIC PEPTIDE 5: 291.3 pg/mL — AB (ref 0.0–100.0)

## 2018-08-18 LAB — HEPARIN LEVEL (UNFRACTIONATED)

## 2018-08-18 MED ORDER — HEPARIN BOLUS VIA INFUSION
3000.0000 [IU] | Freq: Once | INTRAVENOUS | Status: AC
  Administered 2018-08-18: 3000 [IU] via INTRAVENOUS
  Filled 2018-08-18: qty 3000

## 2018-08-18 MED ORDER — HEPARIN (PORCINE) IN NACL 100-0.45 UNIT/ML-% IJ SOLN
1200.0000 [IU]/h | INTRAMUSCULAR | Status: DC
  Administered 2018-08-18: 1000 [IU]/h via INTRAVENOUS
  Filled 2018-08-18 (×2): qty 250

## 2018-08-18 MED ORDER — INSULIN GLARGINE 100 UNIT/ML ~~LOC~~ SOLN: 45.0000 [IU] | Freq: Every day | SUBCUTANEOUS | Status: DC

## 2018-08-18 MED ORDER — SODIUM CHLORIDE 0.9 % IV SOLN
INTRAVENOUS | Status: DC
  Administered 2018-08-18 (×2): via INTRAVENOUS

## 2018-08-18 MED ORDER — FOLIC ACID 1 MG PO TABS
1.0000 mg | ORAL_TABLET | Freq: Every day | ORAL | Status: DC
  Administered 2018-08-18 – 2018-08-19 (×2): 1 mg via ORAL
  Filled 2018-08-18 (×2): qty 1

## 2018-08-18 MED ORDER — LORAZEPAM 1 MG PO TABS
1.0000 mg | ORAL_TABLET | Freq: Four times a day (QID) | ORAL | Status: DC | PRN
  Administered 2018-08-19 (×2): 1 mg via ORAL
  Filled 2018-08-18 (×2): qty 1

## 2018-08-18 MED ORDER — VITAMIN B-1 100 MG PO TABS
100.0000 mg | ORAL_TABLET | Freq: Every day | ORAL | Status: DC
  Administered 2018-08-18 – 2018-08-19 (×2): 100 mg via ORAL
  Filled 2018-08-18 (×2): qty 1

## 2018-08-18 MED ORDER — INSULIN ASPART 100 UNIT/ML ~~LOC~~ SOLN
0.0000 [IU] | Freq: Three times a day (TID) | SUBCUTANEOUS | Status: DC
  Administered 2018-08-18: 8 [IU] via SUBCUTANEOUS
  Administered 2018-08-18: 2 [IU] via SUBCUTANEOUS
  Administered 2018-08-18 – 2018-08-19 (×2): 3 [IU] via SUBCUTANEOUS
  Administered 2018-08-19: 5 [IU] via SUBCUTANEOUS
  Filled 2018-08-18 (×5): qty 1

## 2018-08-18 MED ORDER — ADULT MULTIVITAMIN W/MINERALS CH
1.0000 | ORAL_TABLET | Freq: Every day | ORAL | Status: DC
  Administered 2018-08-18 – 2018-08-19 (×2): 1 via ORAL
  Filled 2018-08-18 (×2): qty 1

## 2018-08-18 MED ORDER — ONDANSETRON HCL 4 MG/2ML IJ SOLN: 4.0000 mg | Freq: Four times a day (QID) | INTRAMUSCULAR | Status: DC | PRN

## 2018-08-18 MED ORDER — LORAZEPAM 2 MG/ML IJ SOLN: 1.0000 mg | Freq: Four times a day (QID) | INTRAMUSCULAR | Status: DC | PRN

## 2018-08-18 MED ORDER — THIAMINE HCL 100 MG/ML IJ SOLN: 100.0000 mg | Freq: Every day | INTRAMUSCULAR | Status: DC

## 2018-08-18 MED ORDER — PREGABALIN 25 MG PO CAPS
100.0000 mg | ORAL_CAPSULE | Freq: Two times a day (BID) | ORAL | Status: DC
  Administered 2018-08-18 – 2018-08-19 (×3): 100 mg via ORAL
  Filled 2018-08-18 (×3): qty 4

## 2018-08-18 MED ORDER — INSULIN GLARGINE 100 UNIT/ML ~~LOC~~ SOLN
50.0000 [IU] | Freq: Every day | SUBCUTANEOUS | Status: DC
  Administered 2018-08-18: 50 [IU] via SUBCUTANEOUS
  Filled 2018-08-18 (×3): qty 0.5

## 2018-08-18 MED ORDER — ONDANSETRON HCL 4 MG PO TABS: 4.0000 mg | ORAL_TABLET | Freq: Four times a day (QID) | ORAL | Status: DC | PRN

## 2018-08-18 MED ORDER — AMIODARONE HCL 100 MG PO TABS
100.0000 mg | ORAL_TABLET | Freq: Every day | ORAL | Status: DC
  Administered 2018-08-18 – 2018-08-19 (×2): 100 mg via ORAL
  Filled 2018-08-18 (×2): qty 1

## 2018-08-18 MED ORDER — APIXABAN 5 MG PO TABS
5.0000 mg | ORAL_TABLET | Freq: Two times a day (BID) | ORAL | Status: DC
  Administered 2018-08-18 – 2018-08-19 (×3): 5 mg via ORAL
  Filled 2018-08-18 (×3): qty 1

## 2018-08-18 MED ORDER — INSULIN ASPART 100 UNIT/ML ~~LOC~~ SOLN: 0.0000 [IU] | Freq: Three times a day (TID) | SUBCUTANEOUS | Status: DC

## 2018-08-18 MED ORDER — SODIUM CHLORIDE 0.9 % IV BOLUS
1000.0000 mL | Freq: Once | INTRAVENOUS | Status: AC
  Administered 2018-08-18: 1000 mL via INTRAVENOUS

## 2018-08-18 NOTE — Progress Notes (Deleted)
[facility] [place] for Heparin Indication: atrial fibrillation  Allergies  Allergen Reactions  . Gabapentin Nausea And Vomiting and Other (See Comments)    POSSIBLE SHAKING? TAKING MED CURRENTLY  . Lyrica [Pregabalin] Other (See Comments)    shaking    Patient Measurements: Height: 5\' 4"  (162.6 cm) Weight: 252 lb (114.3 kg) IBW/kg (Calculated) : 54.7 Estimated wt 240 lb (109kg) Ht 64 in IBW 55 kg Heparin dosing wt: ~80 kg  Vital Signs: Temp: 97.9 F (36.6 C) (10/05 1100) Temp Source: Oral (10/05 1100) BP: 139/66 (10/05 1100) Pulse Rate: 78 (10/05 1100)  Labs: Recent Labs    08/18/18 0034 08/18/18 0428 08/18/18 0500 08/18/18 1219  HGB 11.9*  --   --   --   HCT 38.6  --   --   --   PLT 309  --   --   --   APTT  --   --  38* 43*  HEPARINUNFRC  --   --  >2.20*  --   CREATININE 2.73* 2.51*  --   --     Estimated Creatinine Clearance: 31.4 mL/min (A) (by C-G formula based on SCr of 2.51 mg/dL (H)).   [name] presents with ETOH intoxication. Pt on Eliquis PTA for afib. Pt is unable to tell us when she last took her Eliquis dose as she was found passed out at a bar and is still very confused. [name] CBC ok on admission.   Pharmacy consulted to start heparin as pt with AKI - SCr up to 2.73.  Initial PTT = 43 seconds  Goal of Therapy:  Heparin level 0.3-0.7 units/ml; PTT 66-102 sec Monitor platelets by anticoagulation protocol: Yes   Plan:  Heparin 3000 units iv bolus x 1 Heparin drip to 1200 units / hr 6 hour PTT  F/u daily heparin level and PTT F/u renal function and ability to restart Eliquis   Thank you Anette Guarneri, PharmD [phone] 08/18/2018,12:53 PM

## 2018-08-18 NOTE — Progress Notes (Addendum)
Patient admitted overnight with acute kidney injury.  Repeat BMP is pending.  She remains very weak and unable to ambulate safely.  Physical therapy has been consulted.  Chronic systolic congestive heart failure and is on amiodarone due to paroxysmal atrial fibrillation.  She is currently on heparin as oral anticoagulation is held due to acute kidney injury.  Did speak with pharmacy who feels that we can safely restart her oral anticoagulation.  We will continue to hydrate and monitor.  Doubt discharge today.

## 2018-08-18 NOTE — Progress Notes (Signed)
[place] for Heparin Indication: atrial fibrillation  Allergies  Allergen Reactions  . Gabapentin Nausea And Vomiting and Other (See Comments)    POSSIBLE SHAKING? TAKING MED CURRENTLY  . Lyrica [Pregabalin] Other (See Comments)    shaking    Patient Measurements: Height: 5\' 4"  (162.6 cm) Weight: 252 lb (114.3 kg) IBW/kg (Calculated) : 54.7 Estimated wt 240 lb (109kg) Ht 64 in IBW 55 kg Heparin dosing wt: ~80 kg  Vital Signs: Temp: 97.9 F (36.6 C) (10/05 1100) Temp Source: Oral (10/05 1100) BP: 139/66 (10/05 1100) Pulse Rate: 78 (10/05 1100)  Labs: Recent Labs    08/18/18 0034 08/18/18 0428 08/18/18 0500 08/18/18 1219  HGB 11.9*  --   --   --   HCT 38.6  --   --   --   PLT 309  --   --   --   APTT  --   --  38* 43*  HEPARINUNFRC  --   --  >2.20*  --   CREATININE 2.73* 2.51*  --   --     Estimated Creatinine Clearance: 31.4 mL/min (A) (by C-G formula based on SCr of 2.51 mg/dL (H)).   [name] presents with ETOH intoxication. Pt on Eliquis PTA for afib. Pt is unable to tell us when she last took her Eliquis dose as she was found passed out at a bar and is still very confused. [name] CBC ok on admission.   Pharmacy consulted to start heparin as pt with AKI - SCr up to 2.73.  Initial PTT = 43 seconds  Goal of Therapy:  Heparin level 0.3-0.7 units/ml; PTT 66-102 sec Monitor platelets by anticoagulation protocol: Yes   Plan:  Heparin 3000 units iv bolus x 1 Heparin drip to 1200 units / hr 6 hour PTT  F/u daily heparin level and PTT  Eliquis can be resumed with rise in Scr  Thank you Anette Guarneri, PharmD [phone] 08/18/2018,12:57 PM

## 2018-08-18 NOTE — Progress Notes (Addendum)
ANTICOAGULATION CONSULT NOTE - Initial Consult  Pharmacy Consult for Heparin Indication: atrial fibrillation  Allergies  Allergen Reactions  . Gabapentin Nausea And Vomiting and Other (See Comments)    POSSIBLE SHAKING? TAKING MED CURRENTLY  . Lyrica [Pregabalin] Other (See Comments)    shaking    Patient Measurements:   Estimated wt 240 lb (109kg) Ht 64 in IBW 55 kg Heparin dosing wt: ~80 kg  Vital Signs: Temp: 98 F (36.7 C) (10/04 2153) Temp Source: Oral (10/04 2153) BP: 168/91 (10/05 0215) Pulse Rate: 81 (10/05 0215)  Labs: Recent Labs    08/18/18 0034  HGB 11.9*  HCT 38.6  PLT 309  CREATININE 2.73*    CrCl cannot be calculated (Unknown ideal weight.).   Medical History: Past Medical History:  Diagnosis Date  . Alcohol abuse   . Alcoholic cirrhosis ([place])   . Anemia   . Anxiety   . B12 deficiency   . CAD (coronary artery disease)    a. cath 11/10/2014 LM nl, LAD min irregs, D1 30 ost, D2 50d, LCX 30m, OM1 80 p/m (1.5 mm vessel), OM2 21m, RCA nondom 67m-->med rx.. Demand ischemia in the setting of rapid a-fib.  . Cardiomyopathy ([place])   . Carotid artery disease ([place])    a. 01/2015 Carotid Angio: RICA 130, LICA 86V. s/p L carotid endarterectomy 02/2015.  [name] Chronic combined systolic and diastolic CHF (congestive heart failure) ([place])    a. Last echo 12/205: EF 40-45%, inferoapical/posterior HK, not technically sufficient to allow eval of LV diastolic function, normal LA in size..  . Cocaine abuse ([place])   . COPD (chronic obstructive pulmonary disease) ([place])   . Critical lower limb ischemia   . Depression   . Diabetic peripheral neuropathy ([place])   . Elevated troponin    a. Chronic elevation.  [name] GERD (gastroesophageal reflux disease)   . Hyperlipemia   . Hypertension   . Hypokalemia   . Hypomagnesemia   . Hypothyroidism   . Marijuana abuse   . Narcotic abuse ([place])   . Noncompliance   . NSVT (nonsustained ventricular tachycardia) ([place])   . Obesity   .  PAF (paroxysmal atrial fibrillation) ([place])   . Paroxysmal atrial tachycardia ([place])   . Peripheral arterial disease ([place])    a. 01/2015 Angio/PTA: LSFA 100 w/ recon @ adductor canal and 1 vessel runoff via AT, RSFA 99 (atherectomy/pta) - 1 vessel runoff via diff dzs peroneal.  . Poorly controlled type 2 diabetes mellitus ([place])   . Renal insufficiency    a. 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  [name] Symptomatic bradycardia    avoid AV blocking agent per EP  . Tobacco abuse     Medications:  Home med rec pending  [name] presents with ETOH intoxication. Pt on Eliquis PTA for afib. Pt is unable to tell us when she last took her Eliquis dose as she was found passed out at a bar and is still very confused. [name] CBC ok on admission.   Pharmacy consulted to start heparin as pt with AKI - SCr up to 2.73. As Eliquis should be affecting heparin levels, will utilize PTT to monitor heparin levels until Eliquis effects have worn off. Baseline heparin level and PTT pending.  Goal of Therapy:  Heparin level 0.3-0.7 units/ml; PTT 66-102 sec Monitor platelets by anticoagulation protocol: Yes   Plan:  Start heparin gtt at 1200 units/hr. No bolus. Baseline PTT/heparin level F/u 6hr PTT F/u daily heparin level and PTT  F/u renal function and ability to restart Eliquis  Sherlon Handing, PharmD, BCPS Clinical pharmacist  **Pharmacist phone directory can now be found on [email or web address] ([email or web address]).  Listed under [place]. 08/18/2018,4:27 AM   Addendum (0730) Baseline heparin level >2.2 (elevated due to Eliquis) and baseline PTT 38 sec.  Sherlon Handing, PharmD, BCPS Clinical pharmacist

## 2018-08-18 NOTE — Evaluation (Signed)
Physical Therapy Evaluation Patient Details Name: [name] MRN: [id number] DOB: 1961/10/09 Today's Date: 08/18/2018   History of Present Illness  [name] is a 57 y.o. female with medical history significant of EtOH abuse, cocaine and poly substance abuse, COPD, HLP, PAF on eliquis, CKD stage 3, CHF, R transmet amputation.  Patient brought in to the ED by EMS after they were called for her being passed out at a bar earlier tonight.  Reportedly she consumed approximately 2 pitchers of margaritas and 2 shots of tequila.  Clinical Impression  Pt admitted with above diagnosis. Pt currently with functional limitations due to the deficits listed below (see PT Problem List). Pt with 10/10 pain L foot and R knee. Pt with swelling and discoloration R superolateral knee. She admits to OA R knee with frequent knee pain but magnified today, question whether she may have gone down on that knee. Pt needed mod A to stand from bed, short distance ambulation to chair and pt could tolerate no further due to foot pain and R knee instability. Pt has 2 flights of stairs to get into home, do not anticipate her being able to do that today.  Pt will benefit from skilled PT to increase their independence and safety with mobility to allow discharge to the venue listed below.       Follow Up Recommendations Home health PT;Supervision for mobility/OOB    Equipment Recommendations  3in1 (PT)    Recommendations for Other Services       Precautions / Restrictions Precautions Precautions: Fall Precaution Comments: pt does not remember falling but was found down Restrictions Weight Bearing Restrictions: No Other Position/Activity Restrictions: wounds noted L lower leg      Mobility  Bed Mobility Overal bed mobility: Modified Independent                Transfers Overall transfer level: Needs assistance Equipment used: Rolling walker (2 wheeled) Transfers: Sit to/from Stand Sit to Stand: Mod  assist         General transfer comment: pt attempted sit to stand independently and was unable to achieve full standing, needed mod A for power up to get fully upright  Ambulation/Gait Ambulation/Gait assistance: Min assist Gait Distance (Feet): 2 Feet Assistive device: Rolling walker (2 wheeled) Gait Pattern/deviations: Step-to pattern;Trunk flexed;Wide base of support Gait velocity: decreased Gait velocity interpretation: <1.31 ft/sec, indicative of household ambulator General Gait Details: pt took small steps to chair from bed and could tolerate no more due to B foot pain and R knee instability. Min A given for support  Financial trader [employer] (Stroke Patients Only)       Balance Overall balance assessment: Mild deficits observed, not formally tested                                           Pertinent Vitals/Pain Pain Assessment: 0-10 Pain Score: 10-Worst pain ever Pain Location: B feet, L>R and R knee Pain Descriptors / Indicators: Burning;Constant Pain Intervention(s): Limited activity within patient's tolerance;Monitored during session    Home Living Family/patient expects to be discharged to:: Private residence Living Arrangements: Other relatives Available Help at Discharge: Family;Available PRN/intermittently Type of Home: Apartment Home Access: Stairs to enter Entrance Stairs-Rails: Right Entrance Stairs-Number of Steps: 2 flights Home Layout: One  level Home Equipment: Walker - 4 wheels;Wheelchair - manual Additional Comments: pt lives with cousin, they have been trying to get a ground level apt    Prior Function Level of Independence: Independent         Comments: uses RW only occasionally     Hand Dominance   Dominant Hand: Left    Extremity/Trunk Assessment   Upper Extremity Assessment Upper Extremity Assessment: Overall WFL for tasks assessed    Lower Extremity Assessment Lower  Extremity Assessment: RLE deficits/detail;Generalized weakness RLE Deficits / Details: Pt reports OA in R knee but pain today worse than her normal, swelling and bruising noted superolateral knee, question whether she could have fallen onto knee, no point tenderness to palpation or with patellar manipulation.  RLE: Unable to fully assess due to pain RLE Sensation: history of peripheral neuropathy RLE Coordination: WNL    Cervical / Trunk Assessment Cervical / Trunk Assessment: Normal  Communication   Communication: No difficulties  Cognition Arousal/Alertness: Awake/alert Behavior During Therapy: WFL for tasks assessed/performed Overall Cognitive Status: Within Functional Limits for tasks assessed                                        General Comments      Exercises General Exercises - Lower Extremity Ankle Circles/Pumps: AROM;Both;20 reps;Seated   Assessment/Plan    PT Assessment Patient needs continued PT services  PT Problem List Decreased strength;Decreased activity tolerance;Decreased balance;Decreased mobility;Decreased knowledge of precautions;Pain;Impaired sensation       PT Treatment Interventions DME instruction;Gait training;Stair training;Functional mobility training;Therapeutic activities;Therapeutic exercise;Balance training;Neuromuscular re-education;Patient/family education    PT Goals (Current goals can be found in the Care Plan section)  Acute Rehab PT Goals Patient Stated Goal: decreased pain PT Goal Formulation: With patient Time For Goal Achievement: 09/01/18 Potential to Achieve Goals: Fair    Frequency Min 3X/week   Barriers to discharge Inaccessible home environment 2 flights of stairs to enter home    Co-evaluation               AM-PAC PT "6 Clicks" Daily Activity  Outcome Measure Difficulty turning over in bed (including adjusting bedclothes, sheets and blankets)?: None Difficulty moving from lying on back to sitting  on the side of the bed? : None Difficulty sitting down on and standing up from a chair with arms (e.g., wheelchair, bedside commode, etc,.)?: Unable Help needed moving to and from a bed to chair (including a wheelchair)?: A Lot Help needed walking in hospital room?: Total Help needed climbing 3-5 steps with a railing? : Total 6 Click Score: 13    End of Session   Activity Tolerance: Patient limited by pain Patient left: in chair;with call bell/phone within reach Nurse Communication: Mobility status PT Visit Diagnosis: Unsteadiness on feet (R26.81);Muscle weakness (generalized) (M62.81);Pain;Difficulty in walking, not elsewhere classified (R26.2) Pain - Right/Left: (both) Pain - part of body: Ankle and joints of foot;Knee    Time: 1436-1501 PT Time Calculation (min) (ACUTE ONLY): 25 min   Charges:   PT Evaluation $PT Eval Moderate Complexity: 1 Mod PT Treatments $Therapeutic Activity: 8-22 mins        Leighton Roach, PT  Acute Rehab Services  Pager [phone] Office [facility] 08/18/2018, 4:01 PM

## 2018-08-18 NOTE — H&P (Signed)
History and Physical    [name] GYJ:[id number] DOB: 29-Oct-1961 DOA: 08/17/2018  PCP: Sandi Mariscal, MD  Patient coming from: Home  I have personally briefly reviewed patient's old medical records in [place]  Chief Complaint: EtOH intoxication  HPI: [name] is a 57 y.o. female with medical history significant of EtOH abuse, cocaine and poly substance abuse, COPD, HLP, PAF on eliquis, CKD stage 3, CHF.  Patient brought in to the ED by EMS after they were called for her being passed out at a bar earlier tonight.  Reportedly she consumed approximately 2 pitchers of margaritas and 2 shots of tequila.   ED Course: Mental status has improved during ED stay; however, creat came back at 2.7 up from 1/7 back in June.  Patient states she feels dry and thirsty.   Review of Systems: As per HPI otherwise 10 point review of systems negative.   Past Medical History:  Diagnosis Date  . Alcohol abuse   . Alcoholic cirrhosis ([place])   . Anemia   . Anxiety   . B12 deficiency   . CAD (coronary artery disease)    a. cath 11/10/2014 LM nl, LAD min irregs, D1 30 ost, D2 50d, LCX 54m, OM1 80 p/m (1.5 mm vessel), OM2 67m, RCA nondom 76m-->med rx.. Demand ischemia in the setting of rapid a-fib.  . Cardiomyopathy ([place])   . Carotid artery disease ([place])    a. 01/2015 Carotid Angio: RICA 263, LICA 78H. s/p L carotid endarterectomy 02/2015.  [name] Chronic combined systolic and diastolic CHF (congestive heart failure) ([place])    a. Last echo 12/205: EF 40-45%, inferoapical/posterior HK, not technically sufficient to allow eval of LV diastolic function, normal LA in size..  . Cocaine abuse ([place])   . COPD (chronic obstructive pulmonary disease) ([place])   . Critical lower limb ischemia   . Depression   . Diabetic peripheral neuropathy ([place])   . Elevated troponin    a. Chronic elevation.  [name] GERD (gastroesophageal reflux disease)   . Hyperlipemia   . Hypertension   . Hypokalemia   . Hypomagnesemia   .  Hypothyroidism   . Marijuana abuse   . Narcotic abuse ([place])   . Noncompliance   . NSVT (nonsustained ventricular tachycardia) ([place])   . Obesity   . PAF (paroxysmal atrial fibrillation) ([place])   . Paroxysmal atrial tachycardia ([place])   . Peripheral arterial disease ([place])    a. 01/2015 Angio/PTA: LSFA 100 w/ recon @ adductor canal and 1 vessel runoff via AT, RSFA 99 (atherectomy/pta) - 1 vessel runoff via diff dzs peroneal.  . Poorly controlled type 2 diabetes mellitus ([place])   . Renal insufficiency    a. 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  [name] Symptomatic bradycardia    avoid AV blocking agent per EP  . Tobacco abuse     Past Surgical History:  Procedure Laterality Date  . AMPUTATION Right 06/14/2017   Procedure: Right foot transmetatarsal amputation;  Surgeon: Newt Minion, MD;  Location: [facility];  Service: Orthopedics;  Laterality: Right;  . AMPUTATION TOE Right 04/28/2017   Procedure: AMPUTATION OF RIGHT SECOND RAY;  Surgeon: Newt Minion, MD;  Location: [facility];  Service: Orthopedics;  Laterality: Right;  . BALLOON ANGIOPLASTY, ARTERY Right 01/15/2015   SFA/notes 01/15/2015  . CARDIAC CATHETERIZATION    . CARDIAC CATHETERIZATION N/A 01/12/2016   Procedure: Temporary Wire;  Surgeon: Minus Breeding, MD;  Location: [facility];  Service: Cardiovascular;  Laterality: N/A;  .  CARDIOVERSION  ~ 02/2013   "twice"   . CAROTID ANGIOGRAM N/A 01/15/2015   Procedure: CAROTID ANGIOGRAM;  Surgeon: [name], MD;  Location: [facility] C[facility] LAB;  Service: Cardiovascular;  Laterality: N/A;  . DILATION AND CURETTAGE OF UTERUS  1988  . ENDARTERECTOMY Left 02/19/2015   Procedure: LEFT CAROTID ENDARTERECTOMY ;  Surgeon: Serafina Mitchell, MD;  Location: [facility];  Service: Vascular;  Laterality: Left;  . LEFT HEART CATHETERIZATION WITH CORONARY ANGIOGRAM N/A 10/31/2014   Procedure: LEFT HEART CATHETERIZATION WITH CORONARY ANGIOGRAM;  Surgeon: Burnell Blanks, MD;  Location: [facility] CATH LAB;  Service: Cardiovascular;   Laterality: N/A;  . LOWER EXTREMITY ANGIOGRAM N/A 09/10/2013   Procedure: LOWER EXTREMITY ANGIOGRAM;  Surgeon: [name], MD;  Location: [facility] LAB;  Service: Cardiovascular;  Laterality: N/A;  . LOWER EXTREMITY ANGIOGRAM N/A 01/15/2015   Procedure: LOWER EXTREMITY ANGIOGRAM;  Surgeon: [name], MD;  Location: [facility] CATH LAB;  Service: Cardiovascular;  Laterality: N/A;  . LOWER EXTREMITY ANGIOGRAPHY N/A 04/13/2017   Procedure: Lower Extremity Angiography;  Surgeon: [name], MD;  Location: [facility];  Service: Cardiovascular;  Laterality: N/A;  . PERIPHERAL ATHRECTOMY Right 01/15/2015   SFA/notes 01/15/2015  . PERIPHERAL VASCULAR INTERVENTION  04/13/2017   Procedure: Peripheral Vascular Intervention;  Surgeon: [name], MD;  Location: [facility];  Service: Cardiovascular;;     reports that she quit smoking about 16 months ago. Her smoking use included e-cigarettes. She smoked 0.00 packs per day for 40.00 years. She has quit using smokeless tobacco.  Her smokeless tobacco use included snuff. She reports that she has current or past drug history. Drugs: "Crack" cocaine, Marijuana, and Oxycodone. She reports that she does not drink alcohol.  Allergies  Allergen Reactions  . Gabapentin Nausea And Vomiting and Other (See Comments)    POSSIBLE SHAKING? TAKING MED CURRENTLY  . Lyrica [Pregabalin] Other (See Comments)    shaking    Family History  Problem Relation Age of Onset  . Hypertension Mother   . Diabetes Mother   . Cancer Mother        breast, ovarian, colon  . Clotting disorder Mother   . Heart disease Mother   . Heart attack Mother   . Breast cancer Mother        in 76's  . Hypertension Father   . Heart disease Father   . Emphysema Sister        smoked     Prior to Admission medications   Medication Sig Start Date End Date Taking? Authorizing Provider  acetaminophen (TYLENOL) 325 MG tablet Take 650 mg by mouth every 6 (six) hours as  needed for mild pain or moderate pain.     [provider]  albuterol (PROVENTIL HFA;VENTOLIN HFA) 108 (90 Base) MCG/ACT inhaler Inhale 2 puffs into the lungs every 6 (six) hours as needed for wheezing or shortness of breath. 08/09/17   Medina-Vargas, Monina C, NP  Amino Acids-Protein Hydrolys (FEEDING SUPPLEMENT, PRO-STAT SUGAR FREE 64,) LIQD Take 30 mLs by mouth 2 (two) times daily. 06/16/17   Maryellen Pile, MD  amiodarone (PACERONE) 100 MG tablet TAKE 1 TABLET (100 MG TOTAL) BY MOUTH DAILY 06/20/18   Larey Dresser, MD  amLODipine (NORVASC) 10 MG tablet Take 1 tablet (10 mg total) by mouth daily. 08/09/17   Medina-Vargas, Monina C, NP  apixaban (ELIQUIS) 5 MG TABS tablet Take 1 tablet (5 mg total) by mouth 2 (two) times  daily. 08/09/17   Medina-Vargas, Monina C, NP  atorvastatin (LIPITOR) 40 MG tablet TAKE 1 TABLET BY MOUTH ONCE EVERY EVENING Patient taking differently: Take 40 mg by mouth daily.  08/09/17   Medina-Vargas, Monina C, NP  bisoprolol (ZEBETA) 5 MG tablet Take 1.5 tablets (7.5 mg total) by mouth daily. 05/01/18   Clegg, [name] D, NP  Buprenorphine HCl (BELBUCA) 150 MCG FILM Place 150 mcg inside cheek every 12 (twelve) hours.    [provider]  calcium carbonate (TUMS - DOSED IN MG ELEMENTAL CALCIUM) 500 MG chewable tablet Chew 2 tablets by mouth every 6 (six) hours as needed for indigestion or heartburn.     [provider]  cholecalciferol (VITAMIN D) 1000 units tablet Take 1,000 Units by mouth daily.    [provider]  clopidogrel (PLAVIX) 75 MG tablet Take 1 tablet (75 mg total) by mouth daily with breakfast. 08/09/17   Medina-Vargas, Monina C, NP  cyanocobalamin 1000 MCG tablet Take 1 tablet (1,000 mcg total) by mouth daily. 05/11/16   Lyda Jester M, PA-C  docusate sodium (COLACE) 100 MG capsule Take 1 capsule (100 mg total) by mouth 2 (two) times daily. 06/16/17   Maryellen Pile, MD  DULoxetine (CYMBALTA) 60 MG capsule Take 1 capsule (60 mg total)  by mouth daily. 08/09/17   Medina-Vargas, Monina C, NP  folic acid (FOLVITE) 1 MG tablet TAKE 1 TABLET BY MOUTH EVERY DAY 11/30/16   [name], MD  insulin aspart (NOVOLOG) 100 UNIT/ML injection Inject 30 Units into the skin 3 (three) times daily before meals. 08/09/17   Medina-Vargas, Monina C, NP  Insulin Glargine (LANTUS SOLOSTAR) 100 UNIT/ML Solostar Pen Inject 64 Units into the skin daily. Daily at 8PM Patient taking differently: Inject 72 Units into the skin at bedtime. Daily at [facility] 08/09/17   Medina-Vargas, Monina C, NP  levothyroxine (SYNTHROID, LEVOTHROID) 50 MCG tablet Take 1 tablet (50 mcg total) by mouth daily. 08/09/17   Medina-Vargas, Monina C, NP  losartan (COZAAR) 25 MG tablet Take 1 tablet (25 mg total) by mouth daily. 08/09/17   Medina-Vargas, Monina C, NP  metolazone (ZAROXOLYN) 2.5 MG tablet Take 2.5 mg by mouth every Thursday.     [provider]  metolazone (ZAROXOLYN) 2.5 MG tablet TAKE 1 TABLE EVERY WEEK ON THURSDAY. MAY TAKE ONE ADDITIONAL TABLET AS DIRECTED BY HEART FAILURE CLINIC 05/16/18   Bensimhon, Shaune Pascal, MD  pregabalin (LYRICA) 75 MG capsule Take 75 mg by mouth daily.    [provider]  spironolactone (ALDACTONE) 25 MG tablet Take 25 mg by mouth daily.    [provider]  SYMBICORT 160-4.5 MCG/ACT inhaler Inhale 2 puffs into the lungs 2 (two) times daily. 08/09/17   Medina-Vargas, Monina C, NP  torsemide (DEMADEX) 20 MG tablet Take 2 tablets (40 mg total) by mouth daily. 04/11/18   Larey Dresser, MD  traZODone (DESYREL) 100 MG tablet Take 1 tablet (100 mg total) by mouth at bedtime. 08/09/17   Medina-Vargas, Jaymes Graff C, NP    Physical Exam: Vitals:   08/17/18 2153 08/18/18 0215  BP: (!) 149/63 (!) 168/91  Pulse: 76 81  Resp: 18 18  Temp: 98 F (36.7 C)   TempSrc: Oral   SpO2: 99% 98%    Constitutional: NAD, calm, comfortable Eyes: PERRL, lids and conjunctivae Dry ENMT: Mucous membranes are moist. Posterior pharynx clear of any  exudate or lesions.Normal dentition.  Neck: normal, supple, no masses, no thyromegaly Respiratory:B wheezing Cardiovascular: Regular rate and  rhythm, no murmurs / rubs / gallops. Trace BLE pitting edema 2+ pedal pulses. No carotid bruits.  Abdomen: no tenderness, no masses palpated. No hepatosplenomegaly. Bowel sounds positive.  Musculoskeletal: no clubbing / cyanosis. No joint deformity upper and lower extremities. Good ROM, no contractures. Normal muscle tone.  Skin: no rashes, lesions, ulcers. No induration Neurologic: CN 2-12 grossly intact. Sensation intact, DTR normal. Strength 5/5 in all 4.  Psychiatric: Normal judgment and insight. Alert and oriented x 3. Normal mood.    Labs on Admission: I have personally reviewed following labs and imaging studies  CBC: Recent Labs  Lab 08/18/18 0034  WBC 10.5  NEUTROABS 7.1  HGB 11.9*  HCT 38.6  MCV 94.1  PLT 454   Basic Metabolic Panel: Recent Labs  Lab 08/18/18 0034  NA 135  K 4.0  CL 97*  CO2 27  GLUCOSE 326*  BUN 50*  CREATININE 2.73*  CALCIUM 9.0   GFR: CrCl cannot be calculated (Unknown ideal weight.). Liver Function Tests: Recent Labs  Lab 08/18/18 0034  AST 16  ALT 21  ALKPHOS 143*  BILITOT 0.4  PROT 6.7  ALBUMIN 3.0*   No results for input(s): LIPASE, AMYLASE in the last 168 hours. No results for input(s): AMMONIA in the last 168 hours. Coagulation Profile: No results for input(s): INR, PROTIME in the last 168 hours. Cardiac Enzymes: No results for input(s): CKTOTAL, CKMB, CKMBINDEX, TROPONINI in the last 168 hours. BNP (last 3 results) No results for input(s): PROBNP in the last 8760 hours. HbA1C: No results for input(s): HGBA1C in the last 72 hours. CBG: Recent Labs  Lab 08/17/18 2157  GLUCAP 340*   Lipid Profile: No results for input(s): CHOL, HDL, LDLCALC, TRIG, CHOLHDL, LDLDIRECT in the last 72 hours. Thyroid Function Tests: No results for input(s): TSH, T4TOTAL, FREET4, T3FREE, THYROIDAB  in the last 72 hours. Anemia Panel: No results for input(s): VITAMINB12, FOLATE, FERRITIN, TIBC, IRON, RETICCTPCT in the last 72 hours. Urine analysis:    Component Value Date/Time   COLORURINE STRAW (A) 04/13/2018 1945   APPEARANCEUR CLEAR 04/13/2018 1945   LABSPEC 1.009 04/13/2018 1945   PHURINE 5.0 04/13/2018 1945   GLUCOSEU >=500 (A) 04/13/2018 1945   HGBUR SMALL (A) 04/13/2018 1945   BILIRUBINUR NEGATIVE 04/13/2018 1945   KETONESUR NEGATIVE 04/13/2018 1945   PROTEINUR NEGATIVE 04/13/2018 1945   UROBILINOGEN 0.2 09/05/2015 0012   NITRITE NEGATIVE 04/13/2018 1945   LEUKOCYTESUR NEGATIVE 04/13/2018 1945    Radiological Exams on Admission: Dg Chest Port 1 View  Result Date: 08/18/2018 CLINICAL DATA:  Wheezing. EXAM: PORTABLE CHEST 1 VIEW COMPARISON:  Radiographs 06/13/2017 FINDINGS: Low lung volumes with bronchovascular crowding. Unchanged heart size and mediastinal contours allowing for differences in technique. Mild peribronchial thickening. Subsegmental right basilar atelectasis. No focal consolidation. No pleural effusion or pneumothorax. IMPRESSION: Low lung volumes with bronchovascular crowding and mild bronchial thickening. Subsegmental right basilar atelectasis. Electronically Signed   By: Keith Rake M.D.   On: 08/18/2018 03:19    EKG: Independently reviewed.  Assessment/Plan Principal Problem:   AKI (acute kidney injury) ([place]) Active Problems:   Alcohol abuse   DM (diabetes mellitus), type 2 with peripheral vascular complications ([name])   History of cocaine abuse ([place])   PAF (paroxysmal atrial fibrillation) ([name])   Essential hypertension   Chronic systolic CHF (congestive heart failure) ([name])   COPD GOLD 0    CKD (chronic kidney disease), stage III ([place])    1. AKI on CKD stage 3 - 1. Possibly  dehydration secondary to EtOH intoxication. 2. Gentle hydration with IVF and holding diuretics 3. NS at 100 cc/hr, only got 100-200cc bolus in ED 4. Strict intake and  output 5. Urine lytes 6. Repeat BMP in AM 7. Hold losartan and diuretics 2. Chronic systolic CHF - 1. Clinically dont think its in exacerbation 2. No CXR findings for exacerbation, patient denies SOB symptoms, says her peripheral edema is at baseline. 3. Watch for developing symptoms with IVF and holding diuretics as above 3. EtOH abuse - CIWA 4. HTN - continue home BP meds other than diuretics and Losartan 5. COPD - continue home nebs 6. DM2 - 1. Lantus 50 daily 2. Mod SSI AC 7. PAF - 1. Continue amiodarone 2. Use heparin instead of eliquis due to AKI  DVT prophylaxis: Heparin gtt -  Code Status: Full Family Communication: No family in room Disposition Plan: Home after admit Consults called: None Admission status: Place in obs   GARDNER, Tobias Hospitalists Pager [phone] Only works nights!  If 7AM-7PM, please contact the primary day team physician taking care of patient  [email or web address] Password [email or web address]  08/18/2018, 3:52 AM

## 2018-08-19 ENCOUNTER — Observation Stay (HOSPITAL_COMMUNITY): Payer: Medicare HMO

## 2018-08-19 DIAGNOSIS — N179 Acute kidney failure, unspecified: Secondary | ICD-10-CM | POA: Diagnosis not present

## 2018-08-19 LAB — BASIC METABOLIC PANEL
Anion gap: 9 (ref 5–15)
BUN: 37 mg/dL — AB (ref 6–20)
CO2: 23 mmol/L (ref 22–32)
CREATININE: 2.07 mg/dL — AB (ref 0.44–1.00)
Calcium: 8.5 mg/dL — ABNORMAL LOW (ref 8.9–10.3)
Chloride: 102 mmol/L (ref 98–111)
GFR calc Af Amer: 30 mL/min — ABNORMAL LOW (ref >60)
GFR calc non Af Amer: 26 mL/min — ABNORMAL LOW (ref >60)
Glucose, Bld: 286 mg/dL — ABNORMAL HIGH (ref 70–99)
POTASSIUM: 4.3 mmol/L (ref 3.5–5.1)
Sodium: 134 mmol/L — ABNORMAL LOW (ref 135–145)

## 2018-08-19 LAB — HIV ANTIBODY (ROUTINE TESTING W REFLEX): HIV Screen 4th Generation wRfx: NONREACTIVE

## 2018-08-19 LAB — GLUCOSE, CAPILLARY
GLUCOSE-CAPILLARY: 164 mg/dL — AB (ref 70–99)
Glucose-Capillary: 224 mg/dL — ABNORMAL HIGH (ref 70–99)

## 2018-08-19 MED ORDER — CLOPIDOGREL BISULFATE 75 MG PO TABS
75.0000 mg | ORAL_TABLET | Freq: Every day | ORAL | Status: DC
  Administered 2018-08-19: 75 mg via ORAL
  Filled 2018-08-19: qty 1

## 2018-08-19 MED ORDER — CALCIUM CARBONATE ANTACID 500 MG PO CHEW: 2.0000 | CHEWABLE_TABLET | Freq: Four times a day (QID) | ORAL | Status: DC | PRN

## 2018-08-19 MED ORDER — FOLIC ACID 1 MG PO TABS: 1.0000 mg | ORAL_TABLET | Freq: Every day | ORAL | Status: DC

## 2018-08-19 MED ORDER — BUPRENORPHINE HCL 150 MCG BU FILM: 150.0000 ug | ORAL_FILM | Freq: Two times a day (BID) | BUCCAL | Status: DC

## 2018-08-19 MED ORDER — INSULIN DEGLUDEC 200 UNIT/ML ~~LOC~~ SOPN: 72.0000 [IU] | PEN_INJECTOR | Freq: Every day | SUBCUTANEOUS | Status: DC

## 2018-08-19 MED ORDER — INSULIN ASPART 100 UNIT/ML ~~LOC~~ SOLN: 30.0000 [IU] | Freq: Three times a day (TID) | SUBCUTANEOUS | Status: DC

## 2018-08-19 MED ORDER — SPIRONOLACTONE 25 MG PO TABS
25.0000 mg | ORAL_TABLET | Freq: Every day | ORAL | Status: DC
  Administered 2018-08-19: 25 mg via ORAL
  Filled 2018-08-19: qty 1

## 2018-08-19 MED ORDER — LEVOTHYROXINE SODIUM 50 MCG PO TABS
50.0000 ug | ORAL_TABLET | Freq: Every day | ORAL | Status: DC
  Administered 2018-08-19 (×2): 50 ug via ORAL
  Filled 2018-08-19: qty 1

## 2018-08-19 MED ORDER — BISOPROLOL FUMARATE 5 MG PO TABS
7.5000 mg | ORAL_TABLET | Freq: Every day | ORAL | Status: DC
  Administered 2018-08-19: 7.5 mg via ORAL
  Filled 2018-08-19: qty 2

## 2018-08-19 MED ORDER — INSULIN GLARGINE 100 UNIT/ML ~~LOC~~ SOLN
72.0000 [IU] | Freq: Every day | SUBCUTANEOUS | Status: DC
  Filled 2018-08-19: qty 0.72

## 2018-08-19 MED ORDER — AMLODIPINE BESYLATE 10 MG PO TABS
10.0000 mg | ORAL_TABLET | Freq: Every day | ORAL | Status: DC
  Administered 2018-08-19: 10 mg via ORAL
  Filled 2018-08-19: qty 1

## 2018-08-19 MED ORDER — ALBUTEROL SULFATE (2.5 MG/3ML) 0.083% IN NEBU: 2.5000 mg | INHALATION_SOLUTION | Freq: Four times a day (QID) | RESPIRATORY_TRACT | Status: DC | PRN

## 2018-08-19 MED ORDER — VITAMIN D 1000 UNITS PO TABS
1000.0000 [IU] | ORAL_TABLET | Freq: Every day | ORAL | Status: DC
  Administered 2018-08-19: 1000 [IU] via ORAL
  Filled 2018-08-19: qty 1

## 2018-08-19 MED ORDER — DULOXETINE HCL 60 MG PO CPEP
60.0000 mg | ORAL_CAPSULE | Freq: Every day | ORAL | Status: DC
  Administered 2018-08-19: 60 mg via ORAL
  Filled 2018-08-19: qty 1

## 2018-08-19 MED ORDER — ATORVASTATIN CALCIUM 40 MG PO TABS
40.0000 mg | ORAL_TABLET | Freq: Every day | ORAL | Status: DC
  Filled 2018-08-19: qty 1

## 2018-08-19 MED ORDER — MOMETASONE FURO-FORMOTEROL FUM 200-5 MCG/ACT IN AERO
2.0000 | INHALATION_SPRAY | Freq: Two times a day (BID) | RESPIRATORY_TRACT | Status: DC
  Administered 2018-08-19: 2 via RESPIRATORY_TRACT
  Filled 2018-08-19: qty 8.8

## 2018-08-19 MED ORDER — LOSARTAN POTASSIUM 25 MG PO TABS
25.0000 mg | ORAL_TABLET | Freq: Every day | ORAL | Status: DC
  Administered 2018-08-19: 25 mg via ORAL
  Filled 2018-08-19: qty 1

## 2018-08-19 MED ORDER — METOLAZONE 2.5 MG PO TABS: 2.5000 mg | ORAL_TABLET | ORAL | Status: DC

## 2018-08-19 MED ORDER — TRAZODONE HCL 100 MG PO TABS: 100.0000 mg | ORAL_TABLET | Freq: Every day | ORAL | Status: DC

## 2018-08-19 MED ORDER — TRAMADOL HCL 50 MG PO TABS
50.0000 mg | ORAL_TABLET | Freq: Once | ORAL | Status: AC
  Administered 2018-08-19: 50 mg via ORAL
  Filled 2018-08-19: qty 1

## 2018-08-19 NOTE — Plan of Care (Signed)
Pt verbalized understanding plan of care. Pt knowledgeable on health related illness but does not show improvement on health related management. Pt frequently attempts to request and drink food high in carbohydrates. Does not maintain fluid intake and output balance. Right and left leg chronic pain not managed appropriately. Pt  reports incident that happen prior to hospitalization   may have exacerbated her left  leg pain. Left foot has wounds, pt verbalized being managed by home health nurse. Area surrounding wound is reddened in appearance. Pt remains afebrile,no increase in swelling noticed at sight. Pt cannot tolerate being ambulatory for long periods of time due to pain in legs. Pt able to use front wheel walker to move in short distances. Able to pull self up and move around in bed without assistance.

## 2018-08-19 NOTE — Progress Notes (Addendum)
Pt stating pain was relieved some.  Now states pain 5/10.   Pt getting dressed and stating she is ready to be d/c. This RN explained to pt, we are awaiting on lab results to verify kidney function has improved.

## 2018-08-19 NOTE — Progress Notes (Signed)
Pt crying in room, inconsolable. Reports cannot tolerate  pain in lower extremities. States meds given is ineffective. Provide made aware.

## 2018-08-19 NOTE — Discharge Summary (Signed)
Physician Discharge Summary  [name] HYI:[id number] DOB: 10-02-1961 DOA: 08/17/2018  PCP: Sandi Mariscal, MD  Admit date: 08/17/2018 Discharge date: 08/19/2018  Admitted From: Home   Disposition: Home  Recommendations for Outpatient Follow-up:  1. Follow up with PCP in 1-2 weeks 2. Please obtain BMP/CBC in one week 3. Please discontinue use of alcohol  Home Health: No Equipment/Devices: None Discharge Condition: Stable CODE STATUS: Code Diet recommendation: Heart Healthy carbohydrate modified  Brief/Interim Summary: [name] is a 57 y.o. 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  Patient brought in to the ED by EMS after they were called for her being passed out at a bar earlier tonight.  Reportedly she consumed approximately 2 pitchers of margaritas and 2 shots of tequila. Her mental status improved to the emergency department however her creatinine was markedly elevated.  She was clearly very dry and thirsty.  She was placed in overnight observation for management of dehydration and acute kidney injury.  And on the morning of discharge is 2.07.  Patient is encouraged to continue to push p.o. fluids and to avoid alcohol excess. Patient has reached maximal benefit of hospitalization.  Discharge diagnosis, prognosis, plans, follow-up, medications and treatments discussed with the patient(or responsible party) and is in agreement with the plans as described.  Patient is stable for discharge.  Discharge Diagnoses:  Principal Problem:   AKI (acute kidney injury) ([place]) Active Problems:   Alcohol abuse   DM (diabetes mellitus), type 2 with peripheral vascular complications ([name])   History of cocaine abuse ([place])   PAF (paroxysmal atrial fibrillation) ([name])   Essential hypertension   Chronic systolic CHF (congestive heart failure) ([name])   COPD GOLD 0    CKD (chronic kidney disease),  stage III [facility])    Discharge Instructions  Discharge Instructions    Diet - low sodium heart healthy   Complete by:  As directed    Discharge instructions   Complete by:  As directed    Make and keep a follow up appoint ment with Dr [name] your primary physician to be seen in 2 - 3 days   Increase activity slowly   Complete by:  As directed      Allergies as of 08/19/2018      Reactions   Gabapentin Nausea And Vomiting, Other (See Comments)   POSSIBLE SHAKING? TAKING MED CURRENTLY   Lyrica [pregabalin] Other (See Comments)   shaking      Medication List    STOP taking these medications   feeding supplement (PRO-STAT SUGAR FREE 64) Liqd   metolazone 2.5 MG tablet Commonly known as:  ZAROXOLYN   spironolactone 25 MG tablet Commonly known as:  ALDACTONE   torsemide 20 MG tablet Commonly known as:  DEMADEX     TAKE these medications   acetaminophen 325 MG tablet Commonly known as:  TYLENOL Take 650 mg by mouth every 6 (six) hours as needed for mild pain or moderate pain.   albuterol 108 (90 Base) MCG/ACT inhaler Commonly known as:  PROVENTIL HFA;VENTOLIN HFA Inhale 2 puffs into the lungs every 6 (six) hours as needed for wheezing or shortness of breath.   amiodarone 100 MG tablet Commonly known as:  PACERONE TAKE 1 TABLET (100 MG TOTAL) BY MOUTH DAILY What changed:  See the new instructions.   amLODipine 10 MG tablet Commonly known as:  NORVASC Take 1 tablet (10  mg total) by mouth daily.   apixaban 5 MG Tabs tablet Commonly known as:  ELIQUIS Take 1 tablet (5 mg total) by mouth 2 (two) times daily.   atorvastatin 40 MG tablet Commonly known as:  LIPITOR TAKE 1 TABLET BY MOUTH ONCE EVERY EVENING What changed:    how much to take  how to take this  when to take this  additional instructions   BELBUCA 150 MCG Film Generic drug:  Buprenorphine HCl Place 150 mcg inside cheek every 12 (twelve) hours.   bisoprolol 5 MG tablet Commonly known as:   ZEBETA Take 1.5 tablets (7.5 mg total) by mouth daily.   calcium carbonate 500 MG chewable tablet Commonly known as:  TUMS - dosed in mg elemental calcium Chew 2 tablets by mouth every 6 (six) hours as needed for indigestion or heartburn.   cholecalciferol 1000 units tablet Commonly known as:  VITAMIN D Take 1,000 Units by mouth daily.   clopidogrel 75 MG tablet Commonly known as:  PLAVIX Take 1 tablet (75 mg total) by mouth daily with breakfast.   cyanocobalamin 1000 MCG tablet Take 1 tablet (1,000 mcg total) by mouth daily.   docusate sodium 100 MG capsule Commonly known as:  COLACE Take 1 capsule (100 mg total) by mouth 2 (two) times daily.   DULoxetine 60 MG capsule Commonly known as:  CYMBALTA Take 1 capsule (60 mg total) by mouth daily.   folic acid 1 MG tablet Commonly known as:  FOLVITE TAKE 1 TABLET BY MOUTH EVERY DAY   insulin aspart 100 UNIT/ML injection Commonly known as:  novoLOG Inject 30 Units into the skin 3 (three) times daily before meals.   Insulin Glargine 100 UNIT/ML Solostar Pen Commonly known as:  LANTUS Inject 64 Units into the skin daily. Daily at 8PM   levothyroxine 50 MCG tablet Commonly known as:  SYNTHROID, LEVOTHROID Take 1 tablet (50 mcg total) by mouth daily.   losartan 25 MG tablet Commonly known as:  COZAAR Take 1 tablet (25 mg total) by mouth daily.   OZEMPIC (1 MG/DOSE) 2 MG/1.5ML Sopn Generic drug:  Semaglutide (1 MG/DOSE) Inject 1 mg into the skin once a week.   pregabalin 75 MG capsule Commonly known as:  LYRICA Take 75 mg by mouth daily.   SYMBICORT 160-4.5 MCG/ACT inhaler Generic drug:  budesonide-formoterol Inhale 2 puffs into the lungs 2 (two) times daily.   traZODone 100 MG tablet Commonly known as:  DESYREL Take 1 tablet (100 mg total) by mouth at bedtime.   TRESIBA FLEXTOUCH 200 UNIT/ML Sopn Generic drug:  Insulin Degludec Inject 72 Units into the skin at bedtime.      Follow-up Information    Sandi Mariscal,  MD. Schedule an appointment as soon as possible for a visit in 3 day(s).   Specialty:  Internal Medicine Contact information: [place] [address] [phone]          Allergies  Allergen Reactions  . Gabapentin Nausea And Vomiting and Other (See Comments)    POSSIBLE SHAKING? TAKING MED CURRENTLY  . Lyrica [Pregabalin] Other (See Comments)    shaking    Consultations:  None   Procedures/Studies: Dg Chest Port 1 View  Result Date: 08/18/2018 CLINICAL DATA:  Wheezing. EXAM: PORTABLE CHEST 1 VIEW COMPARISON:  Radiographs 06/13/2017 FINDINGS: Low lung volumes with bronchovascular crowding. Unchanged heart size and mediastinal contours allowing for differences in technique. Mild peribronchial thickening. Subsegmental right basilar atelectasis. No focal consolidation. No pleural effusion or pneumothorax.  IMPRESSION: Low lung volumes with bronchovascular crowding and mild bronchial thickening. Subsegmental right basilar atelectasis. Electronically Signed   By: Keith Rake M.D.   On: 08/18/2018 03:19   Dg Tibia/fibula Left Port  Result Date: 08/19/2018 CLINICAL DATA:  Chronic left leg pain EXAM: PORTABLE LEFT TIBIA AND FIBULA - 2 VIEW COMPARISON:  None. FINDINGS: No acute bony abnormality. Specifically, no fracture, subluxation, or dislocation. Soft tissues intact. Mild degenerative changes in the left knee. IMPRESSION: No acute bony abnormality. Electronically Signed   By: Rolm Baptise M.D.   On: 08/19/2018 07:31       Subjective: Patient awake and alert eating lunch with no complaints.  She is able to tolerate her oral foods quite well.  Discharge Exam: Vitals:   08/19/18 0800 08/19/18 1214  BP:  (!) 155/81  Pulse: 92 89  Resp:  18  Temp:  98.8 F (37.1 C)  SpO2:  97%   Vitals:   08/18/18 2327 08/19/18 0623 08/19/18 0800 08/19/18 1214  BP: (!) 150/61 (!) 156/84  (!) 155/81  Pulse: 83 88 92 89  Resp: 16 18  18   Temp: 98.7 F (37.1 C) 97.7  F (36.5 C)  98.8 F (37.1 C)  TempSrc: Oral Oral  Oral  SpO2:  94%  97%  Weight:      Height:        General: Pt is alert, awake, not in acute distress Cardiovascular: RRR, S1/S2 +, no rubs, no gallops Respiratory: CTA bilaterally, no wheezing, no rhonchi Abdominal: Soft, NT, ND, bowel sounds + Extremities: no edema, no cyanosis    The results of significant diagnostics from this hospitalization (including imaging, microbiology, ancillary and laboratory) are listed below for reference.     Microbiology: No results found for this or any previous visit (from the past 240 hour(s)).   Labs: BNP (last 3 results) Recent Labs    08/18/18 0048  BNP 169.6*   Basic Metabolic Panel: Recent Labs  Lab 08/18/18 0034 08/18/18 0428 08/18/18 1322 08/19/18 0837  NA 135 137 137 134*  K 4.0 4.2 4.3 4.3  CL 97* 97* 101 102  CO2 27 27 28 23   GLUCOSE 326* 280* 145* 286*  BUN 50* 48* 43* 37*  CREATININE 2.73* 2.51* 2.27* 2.07*  CALCIUM 9.0 9.1 8.9 8.5*   Liver Function Tests: Recent Labs  Lab 08/18/18 0034  AST 16  ALT 21  ALKPHOS 143*  BILITOT 0.4  PROT 6.7  ALBUMIN 3.0*   No results for input(s): LIPASE, AMYLASE in the last 168 hours. No results for input(s): AMMONIA in the last 168 hours. CBC: Recent Labs  Lab 08/18/18 0034  WBC 10.5  NEUTROABS 7.1  HGB 11.9*  HCT 38.6  MCV 94.1  PLT 309   Cardiac Enzymes: No results for input(s): CKTOTAL, CKMB, CKMBINDEX, TROPONINI in the last 168 hours. BNP: Invalid input(s): POCBNP CBG: Recent Labs  Lab 08/18/18 1140 08/18/18 1647 08/18/18 1957 08/19/18 0733 08/19/18 1236  GLUCAP 152* 183* 197* 164* 224*   D-Dimer No results for input(s): DDIMER in the last 72 hours. Hgb A1c No results for input(s): HGBA1C in the last 72 hours. Lipid Profile No results for input(s): CHOL, HDL, LDLCALC, TRIG, CHOLHDL, LDLDIRECT in the last 72 hours. Thyroid function studies No results for input(s): TSH, T4TOTAL, T3FREE,  THYROIDAB in the last 72 hours.  Invalid input(s): FREET3 Anemia work up No results for input(s): VITAMINB12, FOLATE, FERRITIN, TIBC, IRON, RETICCTPCT in the last 72 hours. Urinalysis  Component Value Date/Time   COLORURINE YELLOW 08/18/2018 0519   APPEARANCEUR CLEAR 08/18/2018 0519   LABSPEC 1.011 08/18/2018 0519   PHURINE 5.0 08/18/2018 0519   GLUCOSEU 50 (A) 08/18/2018 0519   HGBUR SMALL (A) 08/18/2018 0519   BILIRUBINUR NEGATIVE 08/18/2018 0519   KETONESUR NEGATIVE 08/18/2018 0519   PROTEINUR NEGATIVE 08/18/2018 0519   UROBILINOGEN 0.2 09/05/2015 0012   NITRITE NEGATIVE 08/18/2018 0519   LEUKOCYTESUR NEGATIVE 08/18/2018 0519   Sepsis Labs Invalid input(s): PROCALCITONIN,  WBC,  LACTICIDVEN Microbiology No results found for this or any previous visit (from the past 240 hour(s)).   Time coordinating discharge: 42 minutes  SIGNED:   Lady Deutscher, MD  FACP [facility] 08/19/2018, 3:06 PM Pager   If 7PM-7AM, please contact night-coverage [email or web address] Password [email or web address]

## 2018-08-19 NOTE — Progress Notes (Signed)
Pt stable, ambulatory, and verbalizes understanding of d/c instructions.  Pt currently awaiting family to arrive for transportation home.

## 2018-09-03 ENCOUNTER — Encounter (HOSPITAL_COMMUNITY): Payer: Self-pay

## 2018-09-03 ENCOUNTER — Telehealth (HOSPITAL_COMMUNITY): Payer: Self-pay | Admitting: Surgery

## 2018-09-03 ENCOUNTER — Ambulatory Visit (HOSPITAL_COMMUNITY)
Admission: RE | Admit: 2018-09-03 | Discharge: 2018-09-03 | Disposition: A | Discharge status: Home / Self Care | Payer: Medicare HMO | Source: Ambulatory Visit | Attending: Internal Medicine | Admitting: Internal Medicine

## 2018-09-03 VITALS — BP 152/88 | HR 91 | Wt 242.2 lb

## 2018-09-03 DIAGNOSIS — I5022 Chronic systolic (congestive) heart failure: Secondary | ICD-10-CM | POA: Diagnosis present

## 2018-09-03 DIAGNOSIS — R001 Bradycardia, unspecified: Secondary | ICD-10-CM | POA: Diagnosis not present

## 2018-09-03 DIAGNOSIS — I480 Paroxysmal atrial fibrillation: Secondary | ICD-10-CM | POA: Diagnosis not present

## 2018-09-03 DIAGNOSIS — E039 Hypothyroidism, unspecified: Secondary | ICD-10-CM | POA: Insufficient documentation

## 2018-09-03 DIAGNOSIS — I10 Essential (primary) hypertension: Secondary | ICD-10-CM

## 2018-09-03 DIAGNOSIS — M79606 Pain in leg, unspecified: Secondary | ICD-10-CM | POA: Insufficient documentation

## 2018-09-03 DIAGNOSIS — Z79890 Hormone replacement therapy (postmenopausal): Secondary | ICD-10-CM | POA: Diagnosis not present

## 2018-09-03 DIAGNOSIS — I6521 Occlusion and stenosis of right carotid artery: Secondary | ICD-10-CM | POA: Insufficient documentation

## 2018-09-03 DIAGNOSIS — Z720 Tobacco use: Secondary | ICD-10-CM

## 2018-09-03 DIAGNOSIS — G4733 Obstructive sleep apnea (adult) (pediatric): Secondary | ICD-10-CM | POA: Diagnosis not present

## 2018-09-03 DIAGNOSIS — Z87891 Personal history of nicotine dependence: Secondary | ICD-10-CM | POA: Diagnosis not present

## 2018-09-03 DIAGNOSIS — N183 Chronic kidney disease, stage 3 unspecified: Secondary | ICD-10-CM

## 2018-09-03 DIAGNOSIS — K7460 Unspecified cirrhosis of liver: Secondary | ICD-10-CM | POA: Insufficient documentation

## 2018-09-03 DIAGNOSIS — J449 Chronic obstructive pulmonary disease, unspecified: Secondary | ICD-10-CM | POA: Diagnosis not present

## 2018-09-03 DIAGNOSIS — Z794 Long term (current) use of insulin: Secondary | ICD-10-CM | POA: Diagnosis not present

## 2018-09-03 DIAGNOSIS — Z7901 Long term (current) use of anticoagulants: Secondary | ICD-10-CM | POA: Insufficient documentation

## 2018-09-03 DIAGNOSIS — I428 Other cardiomyopathies: Secondary | ICD-10-CM | POA: Insufficient documentation

## 2018-09-03 DIAGNOSIS — Z9981 Dependence on supplemental oxygen: Secondary | ICD-10-CM | POA: Diagnosis not present

## 2018-09-03 DIAGNOSIS — E1122 Type 2 diabetes mellitus with diabetic chronic kidney disease: Secondary | ICD-10-CM | POA: Diagnosis not present

## 2018-09-03 DIAGNOSIS — Z8249 Family history of ischemic heart disease and other diseases of the circulatory system: Secondary | ICD-10-CM | POA: Diagnosis not present

## 2018-09-03 DIAGNOSIS — Z716 Tobacco abuse counseling: Secondary | ICD-10-CM | POA: Diagnosis not present

## 2018-09-03 DIAGNOSIS — E875 Hyperkalemia: Secondary | ICD-10-CM

## 2018-09-03 DIAGNOSIS — I130 Hypertensive heart and chronic kidney disease with heart failure and stage 1 through stage 4 chronic kidney disease, or unspecified chronic kidney disease: Secondary | ICD-10-CM | POA: Insufficient documentation

## 2018-09-03 DIAGNOSIS — E1151 Type 2 diabetes mellitus with diabetic peripheral angiopathy without gangrene: Secondary | ICD-10-CM | POA: Diagnosis not present

## 2018-09-03 DIAGNOSIS — I2510 Atherosclerotic heart disease of native coronary artery without angina pectoris: Secondary | ICD-10-CM | POA: Insufficient documentation

## 2018-09-03 DIAGNOSIS — Z7902 Long term (current) use of antithrombotics/antiplatelets: Secondary | ICD-10-CM | POA: Diagnosis not present

## 2018-09-03 LAB — BASIC METABOLIC PANEL
Anion gap: 11 (ref 5–15)
BUN: 29 mg/dL — AB (ref 6–20)
CHLORIDE: 106 mmol/L (ref 98–111)
CO2: 25 mmol/L (ref 22–32)
Calcium: 9 mg/dL (ref 8.9–10.3)
Creatinine, Ser: 1.51 mg/dL — ABNORMAL HIGH (ref 0.44–1.00)
GFR calc Af Amer: 44 mL/min — ABNORMAL LOW (ref >60)
GFR calc non Af Amer: 38 mL/min — ABNORMAL LOW (ref >60)
Glucose, Bld: 171 mg/dL — ABNORMAL HIGH (ref 70–99)
POTASSIUM: 4.1 mmol/L (ref 3.5–5.1)
Sodium: 142 mmol/L (ref 135–145)

## 2018-09-03 MED ORDER — BISOPROLOL FUMARATE 10 MG PO TABS: 10.0000 mg | ORAL_TABLET | Freq: Every day | ORAL | 11 refills | Status: DC

## 2018-09-03 NOTE — Addendum Note (Signed)
Encounter addended by: Louann Liv, LCSW on: 09/03/2018 12:04 PM  Actions taken: Visit Navigator Flowsheet section accepted, Sign clinical note

## 2018-09-03 NOTE — Progress Notes (Signed)
CSW met with patient in the clinic due to recent hospitalization. Patient reports she "over did it" with drinking alcohol and states "I learned my lesson". Patient denies need for any further intervention but is agreeable to return to the [employer]. CSW reviewed SDOH wheel with patient. She refused any interventions at the moment for tobacco use. She denies any concerns with housing and states she is considering moving to [place] to be closer to family. CSW provided support and will refer to [facility] to assist with added support and adherence to the medical plan. Raquel Sarna, [place], [place]

## 2018-09-03 NOTE — Progress Notes (Signed)
Patient ID: [name], female   DOB: 11-Sep-1961, 57 y.o.   MRN: [id number]    Advanced Heart Failure Clinic Note   PCP: Dr Roxy Manns HF Cardiology: Dr. Aundra Dubin PV: Dr. Gwenlyn Found  [name] is a 57 year old with history of PAD, carotid stenosis s/p left CEA, relatively mild CAD, chronic systolic CHF, paroxysmal atrial fibrillation and prior substance abuse presents for CHF clinic followup.  She was admitted in 1/17 with acute hypoxemic respiratory failure in the setting of atrial fibrillation/RVR and volume overload.  She was initially intubated.  She converted back to NSR with amiodarone gtt.  She was treated with IV Lasix, steroids, bronchodilators.  She developed AKI and losartan was stopped.    She was admitted in 3/17 with symptomatic bradycardia.  She was supposed to stop diltiazem after this admission but continued to take it.  She presented later in 3/17, again with symptomatic bradycardia (junctional bradycardia), hypotension, and AKI.  Diltiazem and bisoprolol were stopped.  HR recovered and she has had no problems since.  ACEI was stopped with AKI.   In 5/18, she had peripheral angiography with atherectomy of right SFA.  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 7/18 ABIs showed 0.65 on right, 0.31 on left.    Admitted 10/5 through 08/19/2018 with alcohol intoxication and passed out in the parking lot. EMS was called. She did not have any fractures. Torsemide, spironolactone, metolazone was stopped due to AKI.  She was discharged the next day.   Today she presents for HF follow up. Overall feeling fine.  SOB with steps. She does not walk much due to leg pain. Denies PND/Orthopnea. 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 She does not use oxygen any more. Appetite ok. No fever or chills. Weight at home 248-250  pounds. Taking all medications. She has not any alcohol since she 08/18/2018. Smoking 1PPD. Lives with her  cousin. She requires assistance with transportation.   Labs (1/17): K 5.2, creatinine 1.4, AST 43, ALT normal Labs (2/17): K 4.2, creatinine 1.3, BNP 607, TSH normal Labs (3/17): K 4.2, creatinine 1.45, AST/ALT normal, HCT 28.7 Labs (4/17): K 4.4, creatinine 1.61 Labs (08/31/2016): K 4.5 Creatinine 1.43  Labs (11/17): K 4.2, creatinine 1.4 Labs (9/18): K 4.1, creatinine 1.1, TSH normal Labs 04/13/2018: K 5 Creatinine 1.75 Kabs 08/19/2018: K 4.3 Creatinine 2.07   PMH: 1. Carotid stenosis: Known occluded right carotid.  Left CEA in 4/16.  2. CAD: LHC in 12/15 with 80% stenosis in small OM1, nonobstructive disease in other territories.  3. Chronic systolic CHF: Nonischemic cardiomyopathy (?due to ETOH or prior drug abuse).  - Echo (1/17) with EF 45%, mild LV hypertrophy, moderate diastolic dysfunction, inferolateral severe hypokinesis, mildly decreased RV systolic function.  - Echo (1/18): EF 40-45%, mild LVH, normal RV size and systolic function.  4. Atrial fibrillation: Paroxysmal.   5. Type II diabetes 6. CKD: Stage III. 7. COPD: Quit smoking 1/17. On home oxygen.  8. 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  No longer drinks.  9. Hypothyroidism 10. PAD: Atherectomy SFA in 2014 (Dr Gwenlyn Found).  Peripheral arterial dopplers (2/17) with focal 75-99% proximal right SFA stenosis, occluded mid-distal right SFA, chronic occlusion of all runoff arteries on right. - In 5/18, she had peripheral angiography with atherectomy of right SFA.   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  -  7/18 ABIs showed 0.65 on right, 0.31 on left.   11. Anemia 12. Prior cocaine abuse.  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   SH: Lives with sister.  Prior cocaine abuse.  Prior ETOH abuse.  Quit smoking in 1/17, restarted, and quit again in 4/19.  FH: CAD  Review of systems complete and found to be negative unless listed in HPI.     Current Outpatient Medications  Medication Sig Dispense Refill  . acetaminophen (TYLENOL) 325 MG tablet Take 650 mg by mouth every 6 (six) hours as needed for mild pain or moderate pain.     [name] albuterol (PROVENTIL HFA;VENTOLIN HFA) 108 (90 Base) MCG/ACT inhaler Inhale 2 puffs into the lungs every 6 (six) hours as needed for wheezing or shortness of breath. 18 g 0  . amiodarone (PACERONE) 100 MG tablet TAKE 1 TABLET (100 MG TOTAL) BY MOUTH DAILY (Patient taking differently: Take 100 mg by mouth daily. ) 30 tablet 2  . amLODipine (NORVASC) 10 MG tablet Take 1 tablet (10 mg total) by mouth daily. 30 tablet 0  . apixaban (ELIQUIS) 5 MG TABS tablet Take 1 tablet (5 mg total) by mouth 2 (two) times daily. 60 tablet 0  . atorvastatin (LIPITOR) 40 MG tablet TAKE 1 TABLET BY MOUTH ONCE EVERY EVENING (Patient taking differently: Take 40 mg by mouth daily. ) 30 tablet 0  . bisoprolol (ZEBETA) 5 MG tablet Take 1.5 tablets (7.5 mg total) by mouth daily. 45 tablet 11  . Buprenorphine HCl (BELBUCA) 150 MCG FILM Place 150 mcg inside cheek every 12 (twelve) hours.    . cholecalciferol (VITAMIN D) 1000 units tablet Take 1,000 Units by mouth daily.    . clopidogrel (PLAVIX) 75 MG tablet Take 1 tablet (75 mg total) by mouth daily with breakfast. 30 tablet 0  . DULoxetine (CYMBALTA) 60 MG capsule Take 1 capsule (60 mg total) by mouth daily. 30 capsule 0  . folic acid (FOLVITE) 1 MG tablet TAKE 1 TABLET BY MOUTH EVERY DAY 90 tablet 1  . insulin aspart (NOVOLOG) 100 UNIT/ML injection Inject 30 Units into the skin 3 (three) times daily before meals. 30 mL 0  . Insulin Glargine (LANTUS SOLOSTAR) 100 UNIT/ML Solostar Pen Inject 64 Units into the skin daily. Daily at 8PM 15 mL 0  . levothyroxine (SYNTHROID, LEVOTHROID) 50 MCG tablet Take 1 tablet (50 mcg total) by mouth daily. 30 tablet 0  . losartan (COZAAR) 25 MG tablet Take 1 tablet (25 mg total) by mouth daily. 30 tablet 0  . OZEMPIC, 1 MG/DOSE, 2 MG/1.5ML SOPN  Inject 1 mg into the skin once a week.  2  . pregabalin (LYRICA) 75 MG capsule Take 75 mg by mouth daily.    [name] torsemide (DEMADEX) 20 MG tablet Take 40 mg by mouth daily.    . traZODone (DESYREL) 100 MG tablet Take 1 tablet (100 mg total) by mouth at bedtime. 30 tablet 0  . TRESIBA FLEXTOUCH 200 UNIT/ML SOPN Inject 72 Units into the skin at bedtime.  2   No current facility-administered medications for this encounter.    Vitals:   09/03/18 0915  BP: (!) 152/88  Pulse: 91  SpO2: 94%  Weight: 109.9 kg (242 lb 3.2 oz)     Wt Readings from Last 3 Encounters:  09/03/18 109.9 kg (242 lb 3.2 oz)  08/18/18 114.3 kg (252 lb)  05/01/18 112.7 kg (248 lb 6.4 oz)    Physical Exam  General:  Appears  chronically ill. No resp difficulty. Arrived wheel chair.  HEENT: normal Neck: supple. JVP ~10 . Carotids 2+ bilat; no bruits. No lymphadenopathy or thryomegaly appreciated. Cor: PMI nondisplaced. Regular rate & rhythm. No rubs, gallops or murmurs. Lungs: clear Abdomen: soft, nontender, nondistended. No hepatosplenomegaly. No bruits or masses. Good bowel sounds. Extremities: no cyanosis, clubbing, rash, R and LLE 1+ edema. LUE ecchymotic.  Neuro: alert & orientedx3, cranial nerves grossly intact. moves all 4 extremities w/o difficulty. Affect pleasant   Assessment/Plan: 1. Chronic systolic CHF: EF 81-59% on last echo. Has been presumed nonischemic given LHC in 12/15 showing only 80% stenosis in small OM1.   NYHA IIIb.  Volume status mildly elevated. Continue torsemide 40 mg daily.   - Increase bisoprolol to 10 mg daily.  - Continue losartan 25 mg daily. - No spiro or metolazone for now. - Check BMET.      2. Atrial fibrillation: Paroxysmal.   - Continue amiodarone 100 mg daily. LFTs stable earlier this month. PCP following hypothyroidism (on Levoxyl).   Needs regular eye exams. -Regular pulse.  - Continue eliquis 5 mg twice a day. No bleeding problems.  - May need to stop if she continues to  have falls. Currently not drinking alcohol.  3. PAD: Claudication on left seems limiting.  No ulcerations.  She has foot pain almost constantly.  Not candidate for cilostazol with CHF.  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  - Peripheral angiography deferred with AKI. Followed  - Check BMET today.   4. COPD: No wheeze. Discussed smoking cessaton.   5. H/o cirrhosis:  - Likely from ETOH, Now abstinent.  6. OSA: - Unable to tolerate CPAP.  7. CKD stage III:   -Check BMEt  8. HTN:  - Meds as above.  9. Bradycardia:  - Junctional rhythm when hospitalized in 3/17. Resolved. 10. Hyperkalemia Check BMET   Follow up in 4 week  With pharmacy.  Refer SW. Will refer back to Paramedicine today   Discussed smoking cessation and avoiding ingesting alcohol.  Greater than 50% of the (total minutes 25 ) visit spent in counseling/coordination of care regarding the above.      Darrick Grinder, NP  09/03/2018

## 2018-09-03 NOTE — Telephone Encounter (Signed)
Received referral for patient for HF [employer] program.  I have sent all appropriate paperwork via secure email to Paramedic team.

## 2018-09-03 NOTE — Patient Instructions (Signed)
INCREASE Bisoprolol to 10 mg, one tab daily  Labs today We will only contact you if something comes back abnormal or we need to make some changes. Otherwise no news is good news!  Your physician recommends that you schedule a follow-up appointment in: 4 weeks with Doroteo Bradford, PharmD  Your physician recommends that you schedule a follow-up appointment in: 3 months with Dr Aundra Dubin   Do the following things EVERYDAY: 1) Weigh yourself in the morning before breakfast. Write it down and keep it in a log. 2) Take your medicines as prescribed 3) Eat low salt foods-Limit salt (sodium) to 2000 mg per day.  4) Stay as active as you can everyday 5) Limit all fluids for the day to less than 2 liters .

## 2018-09-04 ENCOUNTER — Telehealth (HOSPITAL_COMMUNITY): Payer: Self-pay

## 2018-09-04 NOTE — Telephone Encounter (Signed)
Call placed to pt regarding referral back to [facility] program, left message for her to return my call.   Marylouise Stacks, EMT-Paramedic  09/04/18

## 2018-09-10 ENCOUNTER — Telehealth (HOSPITAL_COMMUNITY): Payer: Self-pay

## 2018-09-10 NOTE — Telephone Encounter (Signed)
Called pt regarding re-referral back to paramedicine program, phone is not accepting calls at this time at all and not able to leave message either.   Marylouise Stacks, EMT-Paramedic  09/10/18

## 2018-09-11 ENCOUNTER — Telehealth (HOSPITAL_COMMUNITY): Payer: Self-pay

## 2018-09-11 NOTE — Telephone Encounter (Signed)
Attempted to contact pt again regarding appointment, her phone is still not accepting calls at this time.  I will attempt to drive by pts address and see if she looks to be there.    Marylouise Stacks, EMT-Paramedic  09/11/18

## 2018-09-20 ENCOUNTER — Other Ambulatory Visit: Payer: Self-pay | Admitting: Cardiology

## 2018-09-20 ENCOUNTER — Other Ambulatory Visit (HOSPITAL_COMMUNITY): Payer: Self-pay

## 2018-09-20 ENCOUNTER — Other Ambulatory Visit: Payer: Self-pay | Admitting: Adult Health

## 2018-09-20 DIAGNOSIS — I480 Paroxysmal atrial fibrillation: Secondary | ICD-10-CM

## 2018-09-20 DIAGNOSIS — I10 Essential (primary) hypertension: Secondary | ICD-10-CM

## 2018-09-20 NOTE — Progress Notes (Signed)
Paramedicine Encounter    Patient ID: [name], female    DOB: January 15, 1961, 57 y.o.   MRN: [id number]   Patient Care Team: Sandi Mariscal, MD as PCP - General (Internal Medicine) [name], MD as Consulting Physician (Cardiology) [name], MD as Consulting Physician (Pulmonary Disease)  Patient Active Problem List   Diagnosis Date Noted  . AKI (acute kidney injury) ([place]) 08/18/2018  . Coagulation disorder ([place]) 08/09/2017  . Depression 07/21/2017  . At risk for adverse drug reaction 06/20/2017  . Peripheral neuropathy 06/20/2017  . Acute osteomyelitis of right foot ([place]) 06/13/2017  . S/P transmetatarsal amputation of foot, right ([place]) 06/05/2017  . 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 PAD (peripheral artery disease) ([place])   . Sepsis due to other etiology ([place]) 04/06/2017  . CKD (chronic kidney disease), stage III ([place]) 11/24/2016  . Suspected sleep apnea 11/24/2016  . Severe obesity (BMI >= 40) ([place]) 02/24/2016  . COPD GOLD 0  02/24/2016  . Chronic systolic CHF (congestive heart failure) ([place]) 02/12/2016  . Encounter for therapeutic drug monitoring 02/10/2016  . Symptomatic bradycardia 01/12/2016  . Essential hypertension 12/22/2015  . Wheeze   . 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 Tobacco abuse 10/23/2015  . Coronary artery disease   . DOE (dyspnea on exertion) 04/29/2015  . PAF (paroxysmal atrial fibrillation) ([place]) 01/16/2015  . Carotid arterial disease ([place]) 01/16/2015  . Claudication ([place]) 01/15/2015  . Demand ischemia ([place]) 10/29/2014  . Insomnia 02/03/2014  . S/P peripheral artery angioplasty - TurboHawk atherectomy; R SFA 09/11/2013    Class: Acute  . Leg pain, bilateral 08/19/2013  . Hypothyroidism 07/31/2013  . Cellulitis 06/13/2013  . History of cocaine abuse ([place]) 06/13/2013  . Long term current use of anticoagulant therapy 05/20/2013  . Alcohol abuse   . Narcotic  abuse ([place])   . Marijuana abuse   . Alcoholic cirrhosis ([place])   . DM (diabetes mellitus), type 2 with peripheral vascular complications ([name])     Current Outpatient Medications:  .  acetaminophen (TYLENOL) 325 MG tablet, Take 650 mg by mouth every 6 (six) hours as needed for mild pain or moderate pain. , Disp: , Rfl:  .  albuterol (PROVENTIL HFA;VENTOLIN HFA) 108 (90 Base) MCG/ACT inhaler, Inhale 2 puffs into the lungs every 6 (six) hours as needed for wheezing or shortness of breath., Disp: 18 g, Rfl: 0 .  amiodarone (PACERONE) 100 MG tablet, TAKE 1 TABLET (100 MG TOTAL) BY MOUTH DAILY (Patient taking differently: Take 100 mg by mouth daily. ), Disp: 30 tablet, Rfl: 2 .  apixaban (ELIQUIS) 5 MG TABS tablet, Take 1 tablet (5 mg total) by mouth 2 (two) times daily., Disp: 60 tablet, Rfl: 0 .  atorvastatin (LIPITOR) 40 MG tablet, TAKE 1 TABLET BY MOUTH ONCE EVERY EVENING (Patient taking differently: Take 40 mg by mouth daily. ), Disp: 30 tablet, Rfl: 0 .  bisoprolol (ZEBETA) 10 MG tablet, Take 1 tablet (10 mg total) by mouth daily., Disp: 30 tablet, Rfl: 11 .  Buprenorphine HCl (BELBUCA) 150 MCG FILM, Place 150 mcg inside cheek every 12 (twelve) hours., Disp: , Rfl:  .  insulin aspart (NOVOLOG) 100 UNIT/ML injection, Inject 30 Units into the skin 3 (three) times daily before meals., Disp: 30 mL, Rfl: 0 .  levothyroxine (SYNTHROID, LEVOTHROID) 50 MCG tablet, Take 1 tablet (50 mcg total) by mouth daily., Disp: 30 tablet, Rfl: 0 .  losartan (COZAAR)  25 MG tablet, Take 1 tablet (25 mg total) by mouth daily., Disp: 30 tablet, Rfl: 0 .  OZEMPIC, 1 MG/DOSE, 2 MG/1.5ML SOPN, Inject 1 mg into the skin once a week., Disp: , Rfl: 2 .  torsemide (DEMADEX) 20 MG tablet, Take 40 mg by mouth daily., Disp: , Rfl:  .  traZODone (DESYREL) 100 MG tablet, Take 1 tablet (100 mg total) by mouth at bedtime., Disp: 30 tablet, Rfl: 0 .  TRESIBA FLEXTOUCH 200 UNIT/ML SOPN, Inject 72 Units into the skin at bedtime., Disp: ,  Rfl: 2 .  amLODipine (NORVASC) 10 MG tablet, Take 1 tablet (10 mg total) by mouth daily. (Patient not taking: Reported on 09/20/2018), Disp: 30 tablet, Rfl: 0 .  cholecalciferol (VITAMIN D) 1000 units tablet, Take 1,000 Units by mouth daily., Disp: , Rfl:  .  clopidogrel (PLAVIX) 75 MG tablet, Take 1 tablet (75 mg total) by mouth daily with breakfast. (Patient not taking: Reported on 09/20/2018), Disp: 30 tablet, Rfl: 0 .  DULoxetine (CYMBALTA) 60 MG capsule, Take 1 capsule (60 mg total) by mouth daily. (Patient not taking: Reported on 09/20/2018), Disp: 30 capsule, Rfl: 0 .  folic acid (FOLVITE) 1 MG tablet, TAKE 1 TABLET BY MOUTH EVERY DAY (Patient not taking: Reported on 09/20/2018), Disp: 90 tablet, Rfl: 1 .  Insulin Glargine (LANTUS SOLOSTAR) 100 UNIT/ML Solostar Pen, Inject 64 Units into the skin daily. Daily at 8PM (Patient not taking: Reported on 09/20/2018), Disp: 15 mL, Rfl: 0 .  pregabalin (LYRICA) 75 MG capsule, Take 75 mg by mouth daily., Disp: , Rfl:  Allergies  Allergen Reactions  . Gabapentin Nausea And Vomiting and Other (See Comments)    POSSIBLE SHAKING? TAKING MED CURRENTLY  . Lyrica [Pregabalin] Other (See Comments)    Shaking Pt still taking lyrica--"probably will stop taking"       Social History   Socioeconomic History  . Marital status: Divorced    Spouse name: Not on file  . Number of children: 1  . Years of education: 63  . Highest education level: 12th grade  Occupational History  . Occupation: disabled  Social Needs  . Financial resource strain: Somewhat hard  . Food insecurity:    Worry: Never true    Inability: Never true  . Transportation needs:    Medical: No    Non-medical: No  Tobacco Use  . Smoking status: Former Smoker    Packs/day: 1.00    Years: 40.00    Pack years: 40.00    Types: E-cigarettes, Cigarettes  . Smokeless tobacco: Former Systems developer    Types: Snuff  . Tobacco comment: Patient denied need  Substance and Sexual Activity  . Alcohol  use: Yes    Alcohol/week: 0.0 standard drinks  . Drug use: Yes    Types: "Crack" cocaine, Marijuana, Oxycodone    Comment: 04/29/2015 "last drug use was ~ 09/08/2013"  . Sexual activity: Never  Lifestyle  . Physical activity:    Days per week: Not on file    Minutes per session: Not on file  . Stress: Not on file  Relationships  . Social connections:    Talks on phone: Not on file    Gets together: Not on file    Attends religious service: Not on file    Active member of club or organization: Not on file    Attends meetings of clubs or organizations: Not on file    Relationship status: Not on file  . Intimate partner violence:  Fear of current or ex partner: Not on file    Emotionally abused: Not on file    Physically abused: Not on file    Forced sexual activity: Not on file  Other Topics Concern  . Not on file  Social History Narrative   Lives in [place], in [place] with sister.  They are looking to move but don't have a place to go yet.      Physical Exam      Future Appointments  Date Time Provider [place]  10/03/2018  2:00 PM [facility] None  12/04/2018 11:40 AM Larey Dresser, MD [facility] None    BP (!) 152/78   Pulse 90   Resp 15   LMP 11/27/2012   SpO2 93%   Weight yesterday-? Last visit weight-242 @ clinic  CBG PTA-287  Pt was not answering the phone for initial re-referral. So I just came by to see if she was home as I am familiar with her from the past.  Pt is now living with her cousin. The number that was listed was not the correct one to contact her-she did give me updated number so the one listed now is correct.  She states she believes she is done with home health, they were seeing her for wound care. It was [facility].  She lives on 2nd floor level place, her cousin has written letter to landlord to get them down stair apt but not able to obtain that yet.  She is not weighing daily-last time she weighed was at  clinic and it was 242. She states she is lazy and doesn't do. She needs digital scale to be easier to read.  She is being seen at [facility] on [address] beside [address].  She does not have medicaid-her cousin said they have something to cover the $135. She has disability income of $1073 monthly.  She has been sleeping in chair, cousin said dr sun sent in rx to [facility] in [place] for a hosp bed and they havent heard back yet. Home health nurse said they would help with bed and shower chair but they didn't. She needs small chair to fit in bathroom.   Med review---she said she stopped lyrica,  last night, she will probably stop taking it altogether.  Need refill--- She ran out of her amio- ---amlodipine is out--need doc refill  --eliquis  --atorvastatin --bisoprolol she is only taking 7.5mg   Synthroid- Out of vit d  She is not taking plavix- Not taking cymbalta- torsemide Called these in to [facility]--it will be delivered tomor-also ordered the tresiba and pen needles.  She reports nurse has told her she taking too much insulin--she has changed PCP and they are following up with her about it.   Marylouise Stacks, [facility] Paramedic  09/20/18

## 2018-09-26 ENCOUNTER — Other Ambulatory Visit (HOSPITAL_COMMUNITY): Payer: Self-pay | Admitting: Pharmacist

## 2018-09-26 ENCOUNTER — Other Ambulatory Visit (HOSPITAL_COMMUNITY): Payer: Self-pay

## 2018-09-26 DIAGNOSIS — I10 Essential (primary) hypertension: Secondary | ICD-10-CM

## 2018-09-26 DIAGNOSIS — E1151 Type 2 diabetes mellitus with diabetic peripheral angiopathy without gangrene: Secondary | ICD-10-CM

## 2018-09-26 DIAGNOSIS — I5022 Chronic systolic (congestive) heart failure: Secondary | ICD-10-CM

## 2018-09-26 MED ORDER — AMLODIPINE BESYLATE 10 MG PO TABS: 10.0000 mg | ORAL_TABLET | Freq: Every day | ORAL | 5 refills | Status: DC

## 2018-09-26 MED ORDER — LOSARTAN POTASSIUM 25 MG PO TABS: 25.0000 mg | ORAL_TABLET | Freq: Every day | ORAL | 5 refills | Status: DC

## 2018-09-26 NOTE — Progress Notes (Addendum)
Paramedicine Encounter    Patient ID: [name], female    DOB: [birth year], 57 y.o.   MRN: [id number]   Patient Care Team: Sandi Mariscal, MD as PCP - General (Internal Medicine) [name], MD as Consulting Physician (Cardiology) [name], MD as Consulting Physician (Pulmonary Disease)  Patient Active Problem List   Diagnosis Date Noted  . AKI (acute kidney injury) ([place]) 08/18/2018  . Coagulation disorder ([place]) 08/09/2017  . Depression 07/21/2017  . At risk for adverse drug reaction 06/20/2017  . Peripheral neuropathy 06/20/2017  . Acute osteomyelitis of right foot ([place]) 06/13/2017  . S/P transmetatarsal amputation of foot, right ([place]) 06/05/2017  . 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 PAD (peripheral artery disease) ([place])   . Sepsis due to other etiology ([place]) 04/06/2017  . CKD (chronic kidney disease), stage III ([place]) 11/24/2016  . Suspected sleep apnea 11/24/2016  . Severe obesity (BMI >= 40) ([place]) 02/24/2016  . COPD GOLD 0  02/24/2016  . Chronic systolic CHF (congestive heart failure) ([place]) 02/12/2016  . Encounter for therapeutic drug monitoring 02/10/2016  . Symptomatic bradycardia 01/12/2016  . Essential hypertension 12/22/2015  . Wheeze   . 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 Tobacco abuse 10/23/2015  . Coronary artery disease   . DOE (dyspnea on exertion) 04/29/2015  . PAF (paroxysmal atrial fibrillation) ([place]) 01/16/2015  . Carotid arterial disease ([place]) 01/16/2015  . Claudication ([place]) 01/15/2015  . Demand ischemia ([place]) 10/29/2014  . Insomnia 02/03/2014  . S/P peripheral artery angioplasty - TurboHawk atherectomy; R SFA 09/11/2013    Class: Acute  . Leg pain, bilateral 08/19/2013  . Hypothyroidism 07/31/2013  . Cellulitis 06/13/2013  . History of cocaine abuse ([place]) 06/13/2013  . Long term current use of anticoagulant therapy 05/20/2013  . Alcohol abuse   . Narcotic  abuse ([place])   . Marijuana abuse   . Alcoholic cirrhosis ([place])   . DM (diabetes mellitus), type 2 with peripheral vascular complications ([name])     Current Outpatient Medications:  .  acetaminophen (TYLENOL) 325 MG tablet, Take 650 mg by mouth every 6 (six) hours as needed for mild pain or moderate pain. , Disp: , Rfl:  .  albuterol (PROVENTIL HFA;VENTOLIN HFA) 108 (90 Base) MCG/ACT inhaler, Inhale 2 puffs into the lungs every 6 (six) hours as needed for wheezing or shortness of breath., Disp: 18 g, Rfl: 0 .  amiodarone (PACERONE) 100 MG tablet, TAKE 1 TABLET (100 MG TOTAL) BY MOUTH DAILY, Disp: 30 tablet, Rfl: 5 .  apixaban (ELIQUIS) 5 MG TABS tablet, Take 1 tablet (5 mg total) by mouth 2 (two) times daily., Disp: 60 tablet, Rfl: 0 .  atorvastatin (LIPITOR) 40 MG tablet, TAKE 1 TABLET BY MOUTH ONCE EVERY EVENING (Patient taking differently: Take 40 mg by mouth daily. ), Disp: 30 tablet, Rfl: 0 .  bisoprolol (ZEBETA) 10 MG tablet, Take 1 tablet (10 mg total) by mouth daily., Disp: 30 tablet, Rfl: 11 .  Buprenorphine HCl (BELBUCA) 150 MCG FILM, Place 150 mcg inside cheek every 12 (twelve) hours., Disp: , Rfl:  .  cholecalciferol (VITAMIN D) 1000 units tablet, Take 1,000 Units by mouth daily., Disp: , Rfl:  .  clopidogrel (PLAVIX) 75 MG tablet, Take 1 tablet (75 mg total) by mouth daily with breakfast., Disp: 30 tablet, Rfl: 0 .  DULoxetine (CYMBALTA) 60 MG capsule, Take 1 capsule (60 mg total) by mouth daily., Disp: 30 capsule, Rfl:  0 .  insulin aspart (NOVOLOG) 100 UNIT/ML injection, Inject 30 Units into the skin 3 (three) times daily before meals., Disp: 30 mL, Rfl: 0 .  Insulin Glargine (LANTUS SOLOSTAR) 100 UNIT/ML Solostar Pen, Inject 64 Units into the skin daily. Daily at 8PM, Disp: 15 mL, Rfl: 0 .  levothyroxine (SYNTHROID, LEVOTHROID) 50 MCG tablet, Take 1 tablet (50 mcg total) by mouth daily., Disp: 30 tablet, Rfl: 0 .  torsemide (DEMADEX) 20 MG tablet, Take 40 mg by mouth daily., Disp: ,  Rfl:  .  traZODone (DESYREL) 100 MG tablet, Take 1 tablet (100 mg total) by mouth at bedtime., Disp: 30 tablet, Rfl: 0 .  amLODipine (NORVASC) 10 MG tablet, Take 1 tablet (10 mg total) by mouth daily. (Patient not taking: Reported on 10/02/2018), Disp: 30 tablet, Rfl: 5 .  folic acid (FOLVITE) 1 MG tablet, TAKE 1 TABLET BY MOUTH EVERY DAY (Patient not taking: Reported on 09/20/2018), Disp: 90 tablet, Rfl: 1 .  losartan (COZAAR) 25 MG tablet, Take 1 tablet (25 mg total) by mouth daily. (Patient not taking: Reported on 10/02/2018), Disp: 30 tablet, Rfl: 5 .  OZEMPIC, 1 MG/DOSE, 2 MG/1.5ML SOPN, Inject 1 mg into the skin once a week., Disp: , Rfl: 2 .  pregabalin (LYRICA) 75 MG capsule, Take 75 mg by mouth daily., Disp: , Rfl:  .  TRESIBA FLEXTOUCH 200 UNIT/ML SOPN, Inject 72 Units into the skin at bedtime., Disp: , Rfl: 2 Allergies  Allergen Reactions  . Gabapentin Nausea And Vomiting and Other (See Comments)    POSSIBLE SHAKING? TAKING MED CURRENTLY  . Lyrica [Pregabalin] Other (See Comments)    Shaking Pt still taking lyrica--"probably will stop taking"       Social History   Socioeconomic History  . Marital status: Divorced    Spouse name: Not on file  . Number of children: 1  . Years of education: 62  . Highest education level: 12th grade  Occupational History  . Occupation: disabled  Social Needs  . Financial resource strain: Somewhat hard  . Food insecurity:    Worry: Never true    Inability: Never true  . Transportation needs:    Medical: No    Non-medical: No  Tobacco Use  . Smoking status: Former Smoker    Packs/day: 1.00    Years: 40.00    Pack years: 40.00    Types: E-cigarettes, Cigarettes  . Smokeless tobacco: Former Systems developer    Types: Snuff  . Tobacco comment: Patient denied need  Substance and Sexual Activity  . Alcohol use: Yes    Alcohol/week: 0.0 standard drinks  . Drug use: Yes    Types: "Crack" cocaine, Marijuana, Oxycodone    Comment: 04/29/2015 "last  drug use was ~ 09/08/2013"  . Sexual activity: Never  Lifestyle  . Physical activity:    Days per week: Not on file    Minutes per session: Not on file  . Stress: Not on file  Relationships  . Social connections:    Talks on phone: Not on file    Gets together: Not on file    Attends religious service: Not on file    Active member of club or organization: Not on file    Attends meetings of clubs or organizations: Not on file    Relationship status: Not on file  . Intimate partner violence:    Fear of current or ex partner: Not on file    Emotionally abused: Not on file    Physically  abused: Not on file    Forced sexual activity: Not on file  Other Topics Concern  . Not on file  Social History Narrative   Lives in [place], in [place] with sister.  They are looking to move but don't have a place to go yet.      Physical Exam  Constitutional: She appears well-developed.  Eyes: Pupils are equal, round, and reactive to light.  Neck: Normal range of motion. Neck supple.  Cardiovascular: Normal rate, regular rhythm and normal heart sounds.  Pulmonary/Chest: Effort normal. She has wheezes.  Short of breath upon walking short distances   Abdominal: Soft. She exhibits no distension.  Musculoskeletal: Normal range of motion. She exhibits edema.  +3 edema in both legs with dryness and redness noted.   Neurological: She is alert.  Skin: Skin is warm. Capillary refill takes 2 to 3 seconds.  Psychiatric: She has a normal mood and affect. Her behavior is normal.        Future Appointments  Date Time Provider [place]  10/22/2018  9:00 AM Edrick Kins, DPM [facility]  12/04/2018 11:40 AM Larey Dresser, MD [facility] None    BP 140/70   Pulse 90   Resp 20   LMP 11/27/2012   SpO2 97%   Weight yesterday-250lbs ( a few days ago) Last visit weight-242lbs (at clinic) Weight today: 244lbs  Arrived to find Ms. Segovia sitting in a recliner CAOX4, coughing with some  audible wheezing noted upon coughing. Patient stated she has been coughing with white mucus production for two weeks. Vitals obtained and assessment completed. +3 edema in both legs with redness and dryness noted on both legs. Patient admitted to smoking 1 PPD. Patient also admitted eating high sodium meals such as [employer] and [employer] in the last 24 hrs. Patient was weighed with scale provided. Patient encouraged to weigh daily as well as checking blood sugar, reducing sodium intake as well as tobacco use.  Patient filled her own pill box, upon checking pills patient had placed extra medications of the Bisoprolol. Patient also had placed 2 Eliquis of her PM dose but stated she only takes one. Patient also had noted two Amiodarone in the AM slot. All medication errors were addressed per Epic List and corrected. Patient has been taking 17.5mg  daily of Bisoprolol instead of 10mg  as listed. Patient had two bottles of two different dosages of this same medications and taking both.  Patient reports she has stopped taking Ozempic,Tresiba and Lyrica.   Per pharmacy patient has not had Amlodipine filled since 2018. Amlodipine and Losartan needing to be verified. At 1440 Medications were verified by Pharmacist Doroteo Bradford and Amlodipine and Losartan are to be taken, both were sent in to be filled and picked up. Visit completed. Scheduled for home visit one week out.   Marylouise Stacks, [place] [facility] Paramedic  10/03/18

## 2018-09-29 ENCOUNTER — Other Ambulatory Visit: Payer: Self-pay

## 2018-09-29 ENCOUNTER — Emergency Department (HOSPITAL_COMMUNITY): Payer: Medicare HMO

## 2018-09-29 ENCOUNTER — Encounter (HOSPITAL_COMMUNITY): Payer: Self-pay | Admitting: *Deleted

## 2018-09-29 ENCOUNTER — Emergency Department (HOSPITAL_COMMUNITY)
Admission: EM | Admit: 2018-09-29 | Discharge: 2018-09-29 | Disposition: A | Discharge status: Home / Self Care | Payer: Medicare HMO | Attending: Emergency Medicine | Admitting: Emergency Medicine

## 2018-09-29 DIAGNOSIS — I5042 Chronic combined systolic (congestive) and diastolic (congestive) heart failure: Secondary | ICD-10-CM | POA: Insufficient documentation

## 2018-09-29 DIAGNOSIS — M79672 Pain in left foot: Secondary | ICD-10-CM | POA: Insufficient documentation

## 2018-09-29 DIAGNOSIS — I2510 Atherosclerotic heart disease of native coronary artery without angina pectoris: Secondary | ICD-10-CM | POA: Diagnosis not present

## 2018-09-29 DIAGNOSIS — E039 Hypothyroidism, unspecified: Secondary | ICD-10-CM | POA: Diagnosis not present

## 2018-09-29 DIAGNOSIS — W010XXA Fall on same level from slipping, tripping and stumbling without subsequent striking against object, initial encounter: Secondary | ICD-10-CM | POA: Diagnosis not present

## 2018-09-29 DIAGNOSIS — Y999 Unspecified external cause status: Secondary | ICD-10-CM | POA: Insufficient documentation

## 2018-09-29 DIAGNOSIS — E119 Type 2 diabetes mellitus without complications: Secondary | ICD-10-CM | POA: Diagnosis not present

## 2018-09-29 DIAGNOSIS — Y9301 Activity, walking, marching and hiking: Secondary | ICD-10-CM | POA: Insufficient documentation

## 2018-09-29 DIAGNOSIS — Y929 Unspecified place or not applicable: Secondary | ICD-10-CM | POA: Insufficient documentation

## 2018-09-29 DIAGNOSIS — H1131 Conjunctival hemorrhage, right eye: Secondary | ICD-10-CM | POA: Diagnosis not present

## 2018-09-29 DIAGNOSIS — J449 Chronic obstructive pulmonary disease, unspecified: Secondary | ICD-10-CM | POA: Diagnosis not present

## 2018-09-29 DIAGNOSIS — I130 Hypertensive heart and chronic kidney disease with heart failure and stage 1 through stage 4 chronic kidney disease, or unspecified chronic kidney disease: Secondary | ICD-10-CM | POA: Diagnosis not present

## 2018-09-29 DIAGNOSIS — N183 Chronic kidney disease, stage 3 (moderate): Secondary | ICD-10-CM | POA: Diagnosis not present

## 2018-09-29 DIAGNOSIS — S0990XA Unspecified injury of head, initial encounter: Secondary | ICD-10-CM | POA: Diagnosis not present

## 2018-09-29 DIAGNOSIS — W19XXXA Unspecified fall, initial encounter: Secondary | ICD-10-CM

## 2018-09-29 MED ORDER — ACETAMINOPHEN 325 MG PO TABS: 650.0000 mg | ORAL_TABLET | Freq: Once | ORAL | Status: DC

## 2018-09-29 MED ORDER — FLUORESCEIN SODIUM 1 MG OP STRP
1.0000 | ORAL_STRIP | Freq: Once | OPHTHALMIC | Status: AC
  Administered 2018-09-29: 1 via OPHTHALMIC
  Filled 2018-09-29: qty 1

## 2018-09-29 MED ORDER — ACETAMINOPHEN 325 MG PO TABS: 325.0000 mg | ORAL_TABLET | Freq: Once | ORAL | Status: DC

## 2018-09-29 MED ORDER — TETRACAINE HCL 0.5 % OP SOLN
2.0000 [drp] | Freq: Once | OPHTHALMIC | Status: DC
  Filled 2018-09-29: qty 4

## 2018-09-29 NOTE — ED Triage Notes (Signed)
Pt slipped on leaves and sticks earlier and fell outside. Has hx of neuropathy and reports being unsteady at baseline. Pt has abrasion to R side of face, c/o bilateral knee pain; blood noted in R scleara, denies any blurred vision, worse from baseline, to R eye

## 2018-09-29 NOTE — ED Notes (Signed)
Patient verbalizes understanding of discharge instructions. Opportunity for questioning and answers were provided. Armband removed by staff, pt discharged from ED to lobby in wheelchair to wait for ride.

## 2018-09-29 NOTE — ED Provider Notes (Signed)
[facility] EMERGENCY DEPARTMENT Provider Note   CSN: [id number] Arrival date & time: 09/29/18  1950     History   Chief Complaint Chief Complaint  Patient presents with  . Fall    HPI [name] is a 57 y.o. female with a past medical history of alcohol abuse and cirrhosis, CAD, cardiomyopathy, combined systolic and diastolic CHF, diabetes, hypertension, peripheral artery disease, polysubstance abuse, who presents today for evaluation of a fall that happened at around noon today.  She says that she was walking to her door when she slipped on wet leaves.  She struck the right side of her face.  She did not pass out.  She is chronically anticoagulated with Eliquis.  She denies any neck pain.  No pain in her chest, stomach, back or hips.  She reports pain in her bilateral knees and in her left lower leg and foot.  She says that she feels like she has something in both of her eyes.  Her vision is not changed from her normal.  No other areas of pain or concern.  HPI  Past Medical History:  Diagnosis Date  . Alcohol abuse   . Alcoholic cirrhosis ([place])   . Anemia   . Anxiety   . B12 deficiency   . CAD (coronary artery disease)    a. cath 11/10/2014 LM nl, LAD min irregs, D1 30 ost, D2 50d, LCX 44m, OM1 80 p/m (1.5 mm vessel), OM2 57m, RCA nondom 64m-->med rx.. Demand ischemia in the setting of rapid a-fib.  . Cardiomyopathy ([place])   . Carotid artery disease ([place])    a. 01/2015 Carotid Angio: RICA 854, LICA 62V. s/p L carotid endarterectomy 02/2015.  [name] Chronic combined systolic and diastolic CHF (congestive heart failure) ([place])    a. Last echo 12/205: EF 40-45%, inferoapical/posterior HK, not technically sufficient to allow eval of LV diastolic function, normal LA in size..  . Cocaine abuse ([place])   . COPD (chronic obstructive pulmonary disease) ([place])   . Critical lower limb ischemia   . Depression   . Diabetic peripheral neuropathy ([place])   . Elevated troponin    a. Chronic elevation.  [name] GERD (gastroesophageal reflux disease)   . Hyperlipemia   . Hypertension   . Hypokalemia   . Hypomagnesemia   . Hypothyroidism   . Marijuana abuse   . Narcotic abuse ([place])   . Noncompliance   . NSVT (nonsustained ventricular tachycardia) ([place])   . Obesity   . PAF (paroxysmal atrial fibrillation) ([place])   . Paroxysmal atrial tachycardia ([place])   . Peripheral arterial disease ([place])    a. 01/2015 Angio/PTA: LSFA 100 w/ recon @ adductor canal and 1 vessel runoff via AT, RSFA 99 (atherectomy/pta) - 1 vessel runoff via diff dzs peroneal.  . Poorly controlled type 2 diabetes mellitus ([place])   . Renal insufficiency    a. 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  [name] Symptomatic bradycardia    avoid AV blocking agent per EP  . Tobacco abuse     Patient Active Problem List   Diagnosis Date Noted  . AKI (acute kidney injury) ([place]) 08/18/2018  . Coagulation disorder ([place]) 08/09/2017  . Depression 07/21/2017  . At risk for adverse drug reaction 06/20/2017  . Peripheral neuropathy 06/20/2017  . Acute osteomyelitis of right foot ([place]) 06/13/2017  . S/P transmetatarsal amputation of foot, right ([place]) 06/05/2017  . 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 PAD (peripheral  artery disease) ([employer])   . Sepsis due to other etiology ([place]) 04/06/2017  . CKD (chronic kidney disease), stage III ([place]) 11/24/2016  . Suspected sleep apnea 11/24/2016  . Severe obesity (BMI >= 40) ([place]) 02/24/2016  . COPD GOLD 0  02/24/2016  . Chronic systolic CHF (congestive heart failure) ([place]) 02/12/2016  . Encounter for therapeutic drug monitoring 02/10/2016  . Symptomatic bradycardia 01/12/2016  . Essential hypertension 12/22/2015  . Wheeze   . 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 Tobacco abuse 10/23/2015  . Coronary artery disease   . DOE (dyspnea on exertion) 04/29/2015  . PAF (paroxysmal atrial fibrillation) ([place]) 01/16/2015  . Carotid  arterial disease ([place]) 01/16/2015  . Claudication ([place]) 01/15/2015  . Demand ischemia ([place]) 10/29/2014  . Insomnia 02/03/2014  . S/P peripheral artery angioplasty - [place] atherectomy; R SFA 09/11/2013    Class: Acute  . Leg pain, bilateral 08/19/2013  . Hypothyroidism 07/31/2013  . Cellulitis 06/13/2013  . History of cocaine abuse ([place]) 06/13/2013  . Long term current use of anticoagulant therapy 05/20/2013  . Alcohol abuse   . Narcotic abuse ([place])   . Marijuana abuse   . Alcoholic cirrhosis ([place])   . DM (diabetes mellitus), type 2 with peripheral vascular complications [facility])     Past Surgical History:  Procedure Laterality Date  . AMPUTATION Right 06/14/2017   Procedure: Right foot transmetatarsal amputation;  Surgeon: Newt Minion, MD;  Location: [facility];  Service: Orthopedics;  Laterality: Right;  . AMPUTATION TOE Right 04/28/2017   Procedure: AMPUTATION OF RIGHT SECOND RAY;  Surgeon: Newt Minion, MD;  Location: [facility];  Service: Orthopedics;  Laterality: Right;  . BALLOON ANGIOPLASTY, ARTERY Right 01/15/2015   SFA/notes 01/15/2015  . CARDIAC CATHETERIZATION    . CARDIAC CATHETERIZATION N/A 01/12/2016   Procedure: Temporary Wire;  Surgeon: Minus Breeding, MD;  Location: [place] CV LAB;  Service: Cardiovascular;  Laterality: N/A;  . CARDIOVERSION  ~ 02/2013   "twice"   . CAROTID ANGIOGRAM N/A 01/15/2015   Procedure: CAROTID ANGIOGRAM;  Surgeon: [name], MD;  Location: [facility] CATH LAB;  Service: Cardiovascular;  Laterality: N/A;  . DILATION AND CURETTAGE OF UTERUS  1988  . ENDARTERECTOMY Left 02/19/2015   Procedure: LEFT CAROTID ENDARTERECTOMY ;  Surgeon: Serafina Mitchell, MD;  Location: [place];  Service: Vascular;  Laterality: Left;  . LEFT HEART CATHETERIZATION WITH CORONARY ANGIOGRAM N/A 10/31/2014   Procedure: LEFT HEART CATHETERIZATION WITH CORONARY ANGIOGRAM;  Surgeon: Burnell Blanks, MD;  Location: [facility] CATH LAB;  Service: Cardiovascular;  Laterality: N/A;  . LOWER  EXTREMITY ANGIOGRAM N/A 09/10/2013   Procedure: LOWER EXTREMITY ANGIOGRAM;  Surgeon: [name], MD;  Location: [facility] CATH LAB;  Service: Cardiovascular;  Laterality: N/A;  . LOWER EXTREMITY ANGIOGRAM N/A 01/15/2015   Procedure: LOWER EXTREMITY ANGIOGRAM;  Surgeon: [name], MD;  Location: [facility] CATH LAB;  Service: Cardiovascular;  Laterality: N/A;  . LOWER EXTREMITY ANGIOGRAPHY N/A 04/13/2017   Procedure: Lower Extremity Angiography;  Surgeon: [name], MD;  Location: [place] CV LAB;  Service: Cardiovascular;  Laterality: N/A;  . PERIPHERAL ATHRECTOMY Right 01/15/2015   SFA/notes 01/15/2015  . PERIPHERAL VASCULAR INTERVENTION  04/13/2017   Procedure: Peripheral Vascular Intervention;  Surgeon: [name], MD;  Location: [place] CV LAB;  Service: Cardiovascular;;     OB History    Gravida  1   Para  1   Term  1   Preterm  AB      Living  1     SAB      TAB      Ectopic      Multiple      Live Births               Home Medications    Prior to Admission medications   Medication Sig Start Date End Date Taking? Authorizing Provider  acetaminophen (TYLENOL) 325 MG tablet Take 650 mg by mouth every 6 (six) hours as needed for mild pain or moderate pain.     [provider]  albuterol (PROVENTIL HFA;VENTOLIN HFA) 108 (90 Base) MCG/ACT inhaler Inhale 2 puffs into the lungs every 6 (six) hours as needed for wheezing or shortness of breath. 08/09/17   Medina-Vargas, Monina C, NP  amiodarone (PACERONE) 100 MG tablet TAKE 1 TABLET (100 MG TOTAL) BY MOUTH DAILY 09/20/18   Larey Dresser, MD  amLODipine (NORVASC) 10 MG tablet Take 1 tablet (10 mg total) by mouth daily. 09/26/18   Larey Dresser, MD  apixaban (ELIQUIS) 5 MG TABS tablet Take 1 tablet (5 mg total) by mouth 2 (two) times daily. 08/09/17   Medina-Vargas, Monina C, NP  atorvastatin (LIPITOR) 40 MG tablet TAKE 1 TABLET BY MOUTH ONCE EVERY EVENING Patient taking differently: Take 40  mg by mouth daily.  08/09/17   Medina-Vargas, Monina C, NP  bisoprolol (ZEBETA) 10 MG tablet Take 1 tablet (10 mg total) by mouth daily. 09/03/18 09/03/19  Clegg, [name] D, NP  Buprenorphine HCl (BELBUCA) 150 MCG FILM Place 150 mcg inside cheek every 12 (twelve) hours.    [provider]  cholecalciferol (VITAMIN D) 1000 units tablet Take 1,000 Units by mouth daily.    [provider]  clopidogrel (PLAVIX) 75 MG tablet Take 1 tablet (75 mg total) by mouth daily with breakfast. 08/09/17   Medina-Vargas, Monina C, NP  DULoxetine (CYMBALTA) 60 MG capsule Take 1 capsule (60 mg total) by mouth daily. 08/09/17   Medina-Vargas, Monina C, NP  folic acid (FOLVITE) 1 MG tablet TAKE 1 TABLET BY MOUTH EVERY DAY Patient not taking: Reported on 09/20/2018 11/30/16   [name], MD  insulin aspart (NOVOLOG) 100 UNIT/ML injection Inject 30 Units into the skin 3 (three) times daily before meals. 08/09/17   Medina-Vargas, Monina C, NP  Insulin Glargine (LANTUS SOLOSTAR) 100 UNIT/ML Solostar Pen Inject 64 Units into the skin daily. Daily at [facility] 08/09/17   Medina-Vargas, Monina C, NP  levothyroxine (SYNTHROID, LEVOTHROID) 50 MCG tablet Take 1 tablet (50 mcg total) by mouth daily. 08/09/17   Medina-Vargas, Monina C, NP  losartan (COZAAR) 25 MG tablet Take 1 tablet (25 mg total) by mouth daily. 09/26/18   Larey Dresser, MD  OZEMPIC, 1 MG/DOSE, 2 MG/1.5ML SOPN Inject 1 mg into the skin once a week. 07/19/18   [provider]  pregabalin (LYRICA) 75 MG capsule Take 75 mg by mouth daily.    [provider]  torsemide (DEMADEX) 20 MG tablet Take 40 mg by mouth daily.    [provider]  traZODone (DESYREL) 100 MG tablet Take 1 tablet (100 mg total) by mouth at bedtime. 08/09/17   Medina-Vargas, Monina C, NP  TRESIBA FLEXTOUCH 200 UNIT/ML SOPN Inject 72 Units into the skin at bedtime. 07/19/18   [provider]    Family History Family History  Problem Relation Age of Onset    . Hypertension Mother   . Diabetes Mother   .  Cancer Mother        breast, ovarian, colon  . Clotting disorder Mother   . Heart disease Mother   . Heart attack Mother   . Breast cancer Mother        in 67's  . Hypertension Father   . Heart disease Father   . Emphysema Sister        smoked    Social History Social History   Tobacco Use  . Smoking status: Former Smoker    Packs/day: 1.00    Years: 40.00    Pack years: 40.00    Types: E-cigarettes, Cigarettes  . Smokeless tobacco: Former Systems developer    Types: Snuff  . Tobacco comment: Patient denied need  Substance Use Topics  . Alcohol use: Yes    Alcohol/week: 0.0 standard drinks  . Drug use: Yes    Types: "Crack" cocaine, Marijuana, Oxycodone    Comment: 04/29/2015 "last drug use was ~ 09/08/2013"     Allergies   Gabapentin and Lyrica [pregabalin]   Review of Systems Review of Systems  Constitutional: Negative for chills and fever.  HENT: Negative for congestion.   Eyes: Positive for pain and redness. Negative for photophobia and visual disturbance.  Respiratory: Negative for chest tightness and shortness of breath.   Skin: Positive for wound (Scratch on right sided face).  Neurological: Negative for headaches.  All other systems reviewed and are negative.    Physical Exam Updated Vital Signs BP 121/74   Pulse 84   Temp 98.5 F (36.9 C)   Resp 18   LMP 11/27/2012   SpO2 94%   Physical Exam  Constitutional: She appears well-developed and well-nourished. No distress.  HENT:  Head: Normocephalic.  No hemotympanum bilaterally.  No battle signs or raccoon eyes.    Eyes: Pupils are equal, round, and reactive to light. EOM and lids are normal. No foreign body present in the right eye. No foreign body present in the left eye. Right conjunctiva has a hemorrhage.  Slit lamp exam:      The right eye shows no corneal abrasion and no fluorescein uptake.       The left eye shows no corneal abrasion and no fluorescein  uptake.  Right sided medial sub-conjunctival hemorrhage.  Neck: Normal range of motion. Neck supple.  No C-spine midline or paraspinal tenderness to palpation.  Cardiovascular: Normal rate, regular rhythm and intact distal pulses.  No murmur heard. 2+ DP/PT pulses bilaterally.  Pulmonary/Chest: Effort normal and breath sounds normal. No respiratory distress.  Abdominal: Soft. There is no tenderness.  Musculoskeletal: She exhibits no edema.  Diffuse tenderness to palpation over left foot and ankle.  There is no edema, crepitus, or deformity.  Bilateral lower legs do not have edema, crepitus, or deformity.  Compartments are soft, easily compressible. Right foot has partial amputation.  Neurological: She is alert.  Skin: Skin is warm and dry. She is not diaphoretic.  Superficial abrasion over right cheek.  Psychiatric: She has a normal mood and affect.  Nursing note and vitals reviewed.    ED Treatments / Results  Labs (all labs ordered are listed, but only abnormal results are displayed) Labs Reviewed - No data to display  EKG None  Radiology Dg Tibia/fibula Left  Result Date: 09/29/2018 CLINICAL DATA:  Fall, leg pain EXAM: LEFT TIBIA AND FIBULA - 2 VIEW COMPARISON:  08/19/2018 FINDINGS: No acute bony abnormality. Specifically, no fracture, subluxation, or dislocation. Vascular calcifications. Mild degenerative changes in the left knee. IMPRESSION:  No acute bony abnormality. Electronically Signed   By: Rolm Baptise M.D.   On: 09/29/2018 21:20   Ct Head Wo Contrast  Result Date: 09/29/2018 CLINICAL DATA:  Fall, head trauma.  On anticoagulation. EXAM: CT HEAD WITHOUT CONTRAST TECHNIQUE: Contiguous axial images were obtained from the base of the skull through the vertex without intravenous contrast. 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 No acute intracranial abnormality. Specifically, no hemorrhage,  hydrocephalus, mass lesion, acute infarction, or significant intracranial injury. Vascular: No hyperdense vessel or unexpected calcification. Skull: No acute calvarial abnormality. Sinuses/Orbits: Visualized paranasal sinuses and mastoids clear. Orbital soft tissues unremarkable. 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  No acute intracranial abnormality. Electronically Signed   By: Rolm Baptise M.D.   On: 09/29/2018 21:21   Dg Foot Complete Left  Result Date: 09/29/2018 CLINICAL DATA:  Fall.  Left foot pain. EXAM: LEFT FOOT - COMPLETE 3+ VIEW COMPARISON:  None. FINDINGS: No acute bony abnormality. Specifically, no fracture, subluxation, or dislocation. Vascular calcifications noted. IMPRESSION: No acute bony abnormality. Electronically Signed   By: Rolm Baptise M.D.   On: 09/29/2018 21:18    Procedures Procedures (including critical care time)  Medications Ordered in ED Medications  tetracaine (PONTOCAINE) 0.5 % ophthalmic solution 2 drop ( Both Eyes Canceled Entry 09/29/18 2232)  fluorescein ophthalmic strip 1 strip (1 strip Both Eyes Given 09/29/18 2232)     Initial Impression / Assessment and Plan / ED Course  I have reviewed the triage vital signs and the nursing notes.  Pertinent labs & imaging results that were available during my care of the patient were reviewed by me and considered in my medical decision making (see chart for details).    Patient presents today for evaluation after a fall at home.  This occurred at around noon today.  She did not strike her head, however med list shows she is on Eliquis.  Patient states that she recently stopped taking it, however given multiple comorbidities CT head was obtained without evidence of acute abnormalities.  Patient had pain in her left foot and ankle.  She reported pain in her bilateral knees, however stated that she knew they were not broken and they did not need x-rays.  Left foot and left tib-fib films were obtained which  did not show any acute osseous abnormalities.  Bilateral lower extremities are well perfused.   She complained of foreign body sensation in bilateral eyes.  Fluorescein staining was performed without evidence of abrasion or uptake.  No foreign body is viewed.  She does have a right sided subconjunctival hemorrhage, however reports that her vision is at her normal.  Pupils equal round reactive to light, normal EOM.   Unable to treat patient's pain with ibuprofen due to her CKD, and Tylenol due to her history of alcoholic cirrhosis.  Given her history of polysubstance abuse and normal x-rays I do not feel  Narcotics are appropriate for her pain.  She is offered an Ace wrap which she accepted.  She denied any other complaints or concerns.  Is agreeable for discharge home.  Return precautions were discussed with patient who states their understanding.  At the time of discharge patient denied any unaddressed complaints or concerns.  Patient is agreeable for discharge home.  Patient seen as a shared visit with Dr. Laverta Baltimore.   Final Clinical Impressions(s) / ED Diagnoses   Final diagnoses:  Fall, initial encounter  Left foot pain  Subconjunctival hemorrhage of right eye    ED Discharge Orders    None       Ollen Gross 09/29/18 2252    Margette Fast, MD 09/30/18 [phone]

## 2018-09-29 NOTE — ED Notes (Signed)
Patient transported to CT and X ray 

## 2018-09-29 NOTE — Discharge Instructions (Addendum)
Please take your medications at home.  Please follow up with your primary care doctor.

## 2018-10-02 ENCOUNTER — Telehealth (HOSPITAL_COMMUNITY): Payer: Self-pay | Admitting: Cardiology

## 2018-10-02 ENCOUNTER — Other Ambulatory Visit (HOSPITAL_COMMUNITY): Payer: Self-pay

## 2018-10-02 NOTE — Progress Notes (Signed)
Paramedicine Encounter    Patient ID: [name], female    DOB: [birth year], 57 y.o.   MRN: [id number]   Patient Care Team: Sandi Mariscal, MD as PCP - General (Internal Medicine) [name], MD as Consulting Physician (Cardiology) [name], MD as Consulting Physician (Pulmonary Disease)  Patient Active Problem List   Diagnosis Date Noted  . AKI (acute kidney injury) ([place]) 08/18/2018  . Coagulation disorder ([place]) 08/09/2017  . Depression 07/21/2017  . At risk for adverse drug reaction 06/20/2017  . Peripheral neuropathy 06/20/2017  . Acute osteomyelitis of right foot ([place]) 06/13/2017  . S/P transmetatarsal amputation of foot, right ([place]) 06/05/2017  . 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 PAD (peripheral artery disease) ([place])   . Sepsis due to other etiology ([place]) 04/06/2017  . CKD (chronic kidney disease), stage III ([place]) 11/24/2016  . Suspected sleep apnea 11/24/2016  . Severe obesity (BMI >= 40) ([place]) 02/24/2016  . COPD GOLD 0  02/24/2016  . Chronic systolic CHF (congestive heart failure) ([place]) 02/12/2016  . Encounter for therapeutic drug monitoring 02/10/2016  . Symptomatic bradycardia 01/12/2016  . Essential hypertension 12/22/2015  . Wheeze   . 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 Tobacco abuse 10/23/2015  . Coronary artery disease   . DOE (dyspnea on exertion) 04/29/2015  . PAF (paroxysmal atrial fibrillation) ([place]) 01/16/2015  . Carotid arterial disease ([place]) 01/16/2015  . Claudication ([place]) 01/15/2015  . Demand ischemia ([place]) 10/29/2014  . Insomnia 02/03/2014  . S/P peripheral artery angioplasty - TurboHawk atherectomy; R SFA 09/11/2013    Class: Acute  . Leg pain, bilateral 08/19/2013  . Hypothyroidism 07/31/2013  . Cellulitis 06/13/2013  . History of cocaine abuse ([place]) 06/13/2013  . Long term current use of anticoagulant therapy 05/20/2013  . Alcohol abuse   . Narcotic  abuse ([place])   . Marijuana abuse   . Alcoholic cirrhosis ([place])   . DM (diabetes mellitus), type 2 with peripheral vascular complications ([name])     Current Outpatient Medications:  .  acetaminophen (TYLENOL) 325 MG tablet, Take 650 mg by mouth every 6 (six) hours as needed for mild pain or moderate pain. , Disp: , Rfl:  .  albuterol (PROVENTIL HFA;VENTOLIN HFA) 108 (90 Base) MCG/ACT inhaler, Inhale 2 puffs into the lungs every 6 (six) hours as needed for wheezing or shortness of breath., Disp: 18 g, Rfl: 0 .  amiodarone (PACERONE) 100 MG tablet, TAKE 1 TABLET (100 MG TOTAL) BY MOUTH DAILY, Disp: 30 tablet, Rfl: 5 .  apixaban (ELIQUIS) 5 MG TABS tablet, Take 1 tablet (5 mg total) by mouth 2 (two) times daily., Disp: 60 tablet, Rfl: 0 .  atorvastatin (LIPITOR) 40 MG tablet, TAKE 1 TABLET BY MOUTH ONCE EVERY EVENING (Patient taking differently: Take 40 mg by mouth daily. ), Disp: 30 tablet, Rfl: 0 .  bisoprolol (ZEBETA) 10 MG tablet, Take 1 tablet (10 mg total) by mouth daily., Disp: 30 tablet, Rfl: 11 .  Buprenorphine HCl (BELBUCA) 150 MCG FILM, Place 150 mcg inside cheek every 12 (twelve) hours., Disp: , Rfl:  .  cholecalciferol (VITAMIN D) 1000 units tablet, Take 1,000 Units by mouth daily., Disp: , Rfl:  .  clopidogrel (PLAVIX) 75 MG tablet, Take 1 tablet (75 mg total) by mouth daily with breakfast., Disp: 30 tablet, Rfl: 0 .  DULoxetine (CYMBALTA) 60 MG capsule, Take 1 capsule (60 mg total) by mouth daily., Disp: 30 capsule, Rfl:  0 .  insulin aspart (NOVOLOG) 100 UNIT/ML injection, Inject 30 Units into the skin 3 (three) times daily before meals., Disp: 30 mL, Rfl: 0 .  Insulin Glargine (LANTUS SOLOSTAR) 100 UNIT/ML Solostar Pen, Inject 64 Units into the skin daily. Daily at 8PM, Disp: 15 mL, Rfl: 0 .  levothyroxine (SYNTHROID, LEVOTHROID) 50 MCG tablet, Take 1 tablet (50 mcg total) by mouth daily., Disp: 30 tablet, Rfl: 0 .  OZEMPIC, 1 MG/DOSE, 2 MG/1.5ML SOPN, Inject 1 mg into the skin once a  week., Disp: , Rfl: 2 .  torsemide (DEMADEX) 20 MG tablet, Take 40 mg by mouth daily., Disp: , Rfl:  .  traZODone (DESYREL) 100 MG tablet, Take 1 tablet (100 mg total) by mouth at bedtime., Disp: 30 tablet, Rfl: 0 .  TRESIBA FLEXTOUCH 200 UNIT/ML SOPN, Inject 72 Units into the skin at bedtime., Disp: , Rfl: 2 .  amLODipine (NORVASC) 10 MG tablet, Take 1 tablet (10 mg total) by mouth daily. (Patient not taking: Reported on 10/02/2018), Disp: 30 tablet, Rfl: 5 .  folic acid (FOLVITE) 1 MG tablet, TAKE 1 TABLET BY MOUTH EVERY DAY (Patient not taking: Reported on 09/20/2018), Disp: 90 tablet, Rfl: 1 .  losartan (COZAAR) 25 MG tablet, Take 1 tablet (25 mg total) by mouth daily. (Patient not taking: Reported on 10/02/2018), Disp: 30 tablet, Rfl: 5 .  pregabalin (LYRICA) 75 MG capsule, Take 75 mg by mouth daily., Disp: , Rfl:  Allergies  Allergen Reactions  . Gabapentin Nausea And Vomiting and Other (See Comments)    POSSIBLE SHAKING? TAKING MED CURRENTLY  . Lyrica [Pregabalin] Other (See Comments)    Shaking Pt still taking lyrica--"probably will stop taking"       Social History   Socioeconomic History  . Marital status: Divorced    Spouse name: Not on file  . Number of children: 1  . Years of education: 32  . Highest education level: 12th grade  Occupational History  . Occupation: disabled  Social Needs  . Financial resource strain: Somewhat hard  . Food insecurity:    Worry: Never true    Inability: Never true  . Transportation needs:    Medical: No    Non-medical: No  Tobacco Use  . Smoking status: Former Smoker    Packs/day: 1.00    Years: 40.00    Pack years: 40.00    Types: E-cigarettes, Cigarettes  . Smokeless tobacco: Former Systems developer    Types: Snuff  . Tobacco comment: Patient denied need  Substance and Sexual Activity  . Alcohol use: Yes    Alcohol/week: 0.0 standard drinks  . Drug use: Yes    Types: "Crack" cocaine, Marijuana, Oxycodone    Comment: 04/29/2015 "last  drug use was ~ 09/08/2013"  . Sexual activity: Never  Lifestyle  . Physical activity:    Days per week: Not on file    Minutes per session: Not on file  . Stress: Not on file  Relationships  . Social connections:    Talks on phone: Not on file    Gets together: Not on file    Attends religious service: Not on file    Active member of club or organization: Not on file    Attends meetings of clubs or organizations: Not on file    Relationship status: Not on file  . Intimate partner violence:    Fear of current or ex partner: Not on file    Emotionally abused: Not on file    Physically  abused: Not on file    Forced sexual activity: Not on file  Other Topics Concern  . Not on file  Social History Narrative   Lives in [place], in [place] with sister.  They are looking to move but don't have a place to go yet.      Physical Exam      Future Appointments  Date Time Provider [place]  10/03/2018  2:00 PM [facility] None  12/04/2018 11:40 AM Larey Dresser, MD [facility] None    BP (!) 150/90   Pulse 88   Resp 16   Wt 248 lb (112.5 kg)   LMP 11/27/2012   SpO2 96%   BMI 42.57 kg/m   Weight yesterday-241 Last visit weight-250 [facility] PTA-302 @ 630am  [facility] EMS-216   Pt went to PCP this morning, she reports dr Marvel Plan is increasing her duloxetine to help in her anxiety.  Pharmacy has not brought out her amlodipine, bisoprolol, losartan or lantus-I called pharmacy and they advised it would be brought out today along with the increased dose of cymbalta.  Pt had a fall a few days ago due to tripping over a stump in the yard. She hurt her foot and had a black eye, pt denies LOC, no dizziness, black eye improved.  Pt reports a 7lb gain overnight-she denies any increased sob, no feeling of bloating, last week she was 250- she reports her weight has been fluctuating up and down.  She reports eating hamburger and fried potatoes and onions lst night for dinner. She  lives with cousin and they get food often from food banks so the low sodium diet is difficult.  She is being seen tomor at clinic.  Contacted clinic and spoke to chantel for further--she advised pt can take 44m today and tomor in the afternoon. Pt is aware and agreeable to plan.  She does have slight inspiratory wheeze to upper lobes-she reports using her inhaler and she is due for it again. I advised her to be sure to use it.  meds verified and pill box refilled minus the losartan and amlodipine.   KMarylouise Stacks [facility]amedic  10/02/18

## 2018-10-02 NOTE — Telephone Encounter (Signed)
[name] with para medicine called during called during home visit to report weight gain. Up 7 lbs overnight Mild increase in edema and SOB bp is elevated however patient has not taken bisoprolol and losartan (confirmed that pharmacy will deliver 11/20)   Per VO [place] to take torsemide 40 mg BID x 2 days  [name] with paramedicine aware and voiced understanding

## 2018-10-03 ENCOUNTER — Ambulatory Visit (HOSPITAL_COMMUNITY)
Admission: RE | Admit: 2018-10-03 | Discharge: 2018-10-03 | Disposition: A | Discharge status: Home / Self Care | Payer: Medicare HMO | Source: Ambulatory Visit | Attending: Internal Medicine | Admitting: Internal Medicine

## 2018-10-03 ENCOUNTER — Other Ambulatory Visit (HOSPITAL_COMMUNITY): Payer: Self-pay

## 2018-10-03 VITALS — BP 138/78 | HR 86 | Wt 244.4 lb

## 2018-10-03 DIAGNOSIS — E1151 Type 2 diabetes mellitus with diabetic peripheral angiopathy without gangrene: Secondary | ICD-10-CM

## 2018-10-03 DIAGNOSIS — I739 Peripheral vascular disease, unspecified: Secondary | ICD-10-CM | POA: Diagnosis not present

## 2018-10-03 DIAGNOSIS — J449 Chronic obstructive pulmonary disease, unspecified: Secondary | ICD-10-CM | POA: Diagnosis not present

## 2018-10-03 DIAGNOSIS — I129 Hypertensive chronic kidney disease with stage 1 through stage 4 chronic kidney disease, or unspecified chronic kidney disease: Secondary | ICD-10-CM | POA: Insufficient documentation

## 2018-10-03 DIAGNOSIS — N183 Chronic kidney disease, stage 3 (moderate): Secondary | ICD-10-CM | POA: Diagnosis not present

## 2018-10-03 DIAGNOSIS — I480 Paroxysmal atrial fibrillation: Secondary | ICD-10-CM | POA: Insufficient documentation

## 2018-10-03 DIAGNOSIS — G4733 Obstructive sleep apnea (adult) (pediatric): Secondary | ICD-10-CM | POA: Insufficient documentation

## 2018-10-03 DIAGNOSIS — R001 Bradycardia, unspecified: Secondary | ICD-10-CM | POA: Diagnosis not present

## 2018-10-03 DIAGNOSIS — F17210 Nicotine dependence, cigarettes, uncomplicated: Secondary | ICD-10-CM | POA: Insufficient documentation

## 2018-10-03 DIAGNOSIS — I5022 Chronic systolic (congestive) heart failure: Secondary | ICD-10-CM | POA: Diagnosis not present

## 2018-10-03 DIAGNOSIS — I10 Essential (primary) hypertension: Secondary | ICD-10-CM

## 2018-10-03 LAB — BASIC METABOLIC PANEL
Anion gap: 12 (ref 5–15)
BUN: 25 mg/dL — AB (ref 6–20)
CO2: 24 mmol/L (ref 22–32)
CREATININE: 1.37 mg/dL — AB (ref 0.44–1.00)
Calcium: 8.8 mg/dL — ABNORMAL LOW (ref 8.9–10.3)
Chloride: 104 mmol/L (ref 98–111)
GFR calc Af Amer: 49 mL/min — ABNORMAL LOW (ref >60)
GFR, EST NON AFRICAN AMERICAN: 43 mL/min — AB (ref >60)
Glucose, Bld: 123 mg/dL — ABNORMAL HIGH (ref 70–99)
Potassium: 3.8 mmol/L (ref 3.5–5.1)
Sodium: 140 mmol/L (ref 135–145)

## 2018-10-03 MED ORDER — LOSARTAN POTASSIUM 50 MG PO TABS: 50.0000 mg | ORAL_TABLET | Freq: Every day | ORAL | 5 refills | Status: DC

## 2018-10-03 NOTE — Progress Notes (Signed)
Paramedicine Encounter   Patient ID: [name] , female,   DOB: 1961/01/21,56 y.o.,  MRN: [id number]   Met patient in clinic today with provider--pharmacist visit  Time spent with patient 10mn   B/P-138/[id number] p-86  The pharmacy delivered her meds out yesterday. She took the amlodipine and losartan today. They do need placed in her pill box. --they were placed in pill box.  Her losartan is being increased today to 519mdaily.  Need to confirm dose of cymbalta from PCP--dr richardson [phone]. Pt reports it was increased at last visit but no AVS was provided by office. Will see her again next week.  [facility] that she is to take 9056mf the cymbalta.  Weight @ cliClydeMT-Paramedic [id number]/20/2019  ACTION: Home visit completed

## 2018-10-03 NOTE — Patient Instructions (Signed)
Please INCREASE losartan to 50 mg (2 tablets) DAILY.   Blood work today. We will call you with any changes.   You are scheduled to see the pharmacist again on Thursday 12/5 at 10:00 AM.

## 2018-10-03 NOTE — Progress Notes (Signed)
HF MD: [facility]  HPI:  [name] is a 57 year old with history of PAD, carotid stenosis s/p left CEA, relatively mild CAD, chronic systolic CHF, paroxysmal atrial fibrillation and prior substance abuse presents for CHF clinic followup.  She was admitted in 1/17 with acute hypoxemic respiratory failure in the setting of atrial fibrillation/RVR and volume overload.  She was initially intubated.  She converted back to NSR with amiodarone gtt.  She was treated with IV Lasix, steroids, bronchodilators.  She developed AKI and losartan was stopped.    She was admitted in 3/17 with symptomatic bradycardia.  She was supposed to stop diltiazem after this admission but continued to take it.  She presented later in 3/17, again with symptomatic bradycardia (junctional bradycardia), hypotension, and AKI.  Diltiazem and bisoprolol were stopped.  HR recovered and she has had no problems since.  ACEI was stopped with AKI.   In 5/18, she had peripheral angiography with atherectomy of right SFA.  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 7/18 ABIs showed 0.65 on right, 0.31 on left.    Admitted 10/5 through 08/19/2018 with alcohol intoxication and passed out in the parking lot. EMS was called. She did not have any fractures. Torsemide, spironolactone, metolazone was stopped due to AKI.  She was discharged the next day.   Today she presents with [name] (paramedicine) for pharmacist-led HF medication titration. At last HF clinic visit on 10/21, her bisoprolol was increased to 10 mg daily. On 11/19, [name] (paramedicine) noticed that she had a 7 lb weight gain overnight so she was advised to take torsemide 40 mg BID x 2 days.  Overall feeling fine.  Continues to have a cough that she's had for months now. She feels like she is going through "the change" because she is starting to have hot flashes. She does not use oxygen any more. Appetite ok. No fever or chills. Taking all  medications but restarted amlodipine and losartan yesterday after running out of refills for "a while". She has not any alcohol since she 08/18/2018. Still smoking 1PPD. Lives with her cousin. She requires assistance with transportation.     . Shortness of breath/dyspnea on exertion? no  . Orthopnea/PND? Yes - 1 pillow stable . Edema? Yes - trace . Lightheadedness/dizziness? no . Daily weights at home? Yes - usually stable at 244 lb . Blood pressure/heart rate monitoring at home? Yes - HHRN and [name] usually elevated . Following low-sodium/fluid-restricted diet? yes  HF Medications: Bisoprolol 10 mg PO daily Losartan 25 mg PO daily Torsemide 40 mg PO daily   Has the patient been experiencing any side effects to the medications prescribed?  no  Does the patient have any problems obtaining medications due to transportation or finances?   No - [employer] Part D  Understanding of regimen: fair Understanding of indications: fair Potential of compliance: fair Patient understands to avoid NSAIDs. Patient understands to avoid decongestants.    Pertinent Lab Values: . 10/03/18: Serum creatinine 1.37 (BL 1.3-1.5), BUN 25, Potassium 3.8, Sodium 140  Vital Signs: . Weight: 244.4 lb (dry weight: 248 lb) . Blood pressure: 138/78 mmHg  . Heart rate: 86 bpm   Assessment: 1. Chronic HFpEF borderline (EF 40-45%), due to NICM. NYHA class III symptoms.  - Volume status improving today with increased torsemide yesterday, will take additional torsemide again today then continue 40 mg daily  - Basic disease state pathophysiology, medication indication, mechanism and side effects reviewed at  length with patient and he verbalized understanding  2. Atrial fibrillation: Paroxysmal.   - Continue amiodarone 100 mg daily. LFTs stable earlier this month. PCP following hypothyroidism (on Levoxyl).   Needs regular eye exams. - Regular pulse.  - Continue eliquis 5 mg twice a day. No bleeding problems.  - May  need to stop if she continues to have falls. Currently not drinking alcohol.   3. PAD: Claudication on left seems limiting.  No ulcerations.  She has foot pain almost constantly.  Not candidate for cilostazol with CHF.  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  - Peripheral angiography deferred with AKI. Followed  - Check BMET today.    4. COPD: - No wheeze. Discussed smoking cessaton.    5. H/o cirrhosis:  - Likely from ETOH, Now abstinent.   6. OSA: - Unable to tolerate CPAP.   7. CKD stage III:   - AKI resolved  8. HTN:  - BP above goal <130/80 mmHg - Increase losartan to 50 mg daily  - Continue amlodipine 10 mg daily and bisoprolol 10 mg daily - Will attempt restart spironolactone at next visit as long as AKI has completely resolved  9. Bradycardia:  - Junctional rhythm when hospitalized in 3/17. - Resolved  10. Tobacco Abuse  - Continues to smoke 1ppd (down from 2 ppd)  - Chantix did not help, will try patches next  Plan: 1) Medication changes: Based on clinical presentation, vital signs and recent labs will increase losartan to 50 mg daily 2) Labs: BMET today 3) Follow-up: Pharmacy visit on 12/5 and Dr. Aundra Dubin on 12/04/18   [name]. [name], PharmD, BCPS, CPP Clinical Pharmacist Phone: [phone] 10/03/2018 10:56 AM

## 2018-10-09 ENCOUNTER — Other Ambulatory Visit (HOSPITAL_COMMUNITY): Payer: Self-pay

## 2018-10-09 NOTE — Progress Notes (Signed)
Paramedicine Encounter    Patient ID: [name], female    DOB: 1961-08-25, 57 y.o.   MRN: [id number]   Patient Care Team: Sandi Mariscal, MD as PCP - General (Internal Medicine) [name], MD as Consulting Physician (Cardiology) [name], MD as Consulting Physician (Pulmonary Disease)  Patient Active Problem List   Diagnosis Date Noted  . AKI (acute kidney injury) ([place]) 08/18/2018  . Coagulation disorder ([place]) 08/09/2017  . Depression 07/21/2017  . At risk for adverse drug reaction 06/20/2017  . Peripheral neuropathy 06/20/2017  . Acute osteomyelitis of right foot ([place]) 06/13/2017  . S/P transmetatarsal amputation of foot, right ([place]) 06/05/2017  . 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 ([place]) 05/19/2017  . PAD (peripheral artery disease) ([place])   . Sepsis due to other etiology ([place]) 04/06/2017  . CKD (chronic kidney disease), stage III ([place]) 11/24/2016  . Suspected sleep apnea 11/24/2016  . Severe obesity (BMI >= 40) ([place]) 02/24/2016  . COPD GOLD 0  02/24/2016  . Chronic systolic CHF (congestive heart failure) ([place]) 02/12/2016  . Encounter for therapeutic drug monitoring 02/10/2016  . Symptomatic bradycardia 01/12/2016  . Essential hypertension 12/22/2015  . Wheeze   . 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 Tobacco abuse 10/23/2015  . Coronary artery disease   . DOE (dyspnea on exertion) 04/29/2015  . PAF (paroxysmal atrial fibrillation) ([place]) 01/16/2015  . Carotid arterial disease ([place]) 01/16/2015  . Claudication ([place]) 01/15/2015  . Demand ischemia ([place]) 10/29/2014  . Insomnia 02/03/2014  . S/P peripheral artery angioplasty - TurboHawk atherectomy; R SFA 09/11/2013    Class: Acute  . Leg pain, bilateral 08/19/2013  . Hypothyroidism 07/31/2013  . Cellulitis 06/13/2013  . History of cocaine abuse ([place]) 06/13/2013  . Long term current use of anticoagulant therapy 05/20/2013  . Alcohol abuse   . Narcotic  abuse ([place])   . Marijuana abuse   . Alcoholic cirrhosis ([place])   . DM (diabetes mellitus), type 2 with peripheral vascular complications ([name])     Current Outpatient Medications:  .  acetaminophen (TYLENOL) 325 MG tablet, Take 650 mg by mouth every 6 (six) hours as needed for mild pain or moderate pain. , Disp: , Rfl:  .  albuterol (PROVENTIL HFA;VENTOLIN HFA) 108 (90 Base) MCG/ACT inhaler, Inhale 2 puffs into the lungs every 6 (six) hours as needed for wheezing or shortness of breath., Disp: 18 g, Rfl: 0 .  amiodarone (PACERONE) 100 MG tablet, TAKE 1 TABLET (100 MG TOTAL) BY MOUTH DAILY, Disp: 30 tablet, Rfl: 5 .  amLODipine (NORVASC) 10 MG tablet, Take 1 tablet (10 mg total) by mouth daily., Disp: 30 tablet, Rfl: 5 .  apixaban (ELIQUIS) 5 MG TABS tablet, Take 1 tablet (5 mg total) by mouth 2 (two) times daily., Disp: 60 tablet, Rfl: 0 .  atorvastatin (LIPITOR) 40 MG tablet, TAKE 1 TABLET BY MOUTH ONCE EVERY EVENING, Disp: 30 tablet, Rfl: 0 .  bisoprolol (ZEBETA) 10 MG tablet, Take 1 tablet (10 mg total) by mouth daily., Disp: 30 tablet, Rfl: 11 .  Buprenorphine HCl (BELBUCA) 150 MCG FILM, Place 150 mcg inside cheek every 12 (twelve) hours., Disp: , Rfl:  .  cholecalciferol (VITAMIN D) 1000 units tablet, Take 1,000 Units by mouth daily., Disp: , Rfl:  .  clopidogrel (PLAVIX) 75 MG tablet, Take 1 tablet (75 mg total) by mouth daily with breakfast., Disp: 30 tablet, Rfl: 0 .  DULoxetine (CYMBALTA) 30 MG capsule, Take 30  mg by mouth daily. Take with 60 mg for total of 90 mg daily, Disp: , Rfl:  .  insulin aspart (NOVOLOG) 100 UNIT/ML injection, Inject 30 Units into the skin 3 (three) times daily before meals., Disp: 30 mL, Rfl: 0 .  Insulin Glargine (LANTUS SOLOSTAR) 100 UNIT/ML Solostar Pen, Inject 72 Units into the skin at bedtime., Disp: , Rfl:  .  levothyroxine (SYNTHROID, LEVOTHROID) 50 MCG tablet, Take 1 tablet (50 mcg total) by mouth daily., Disp: 30 tablet, Rfl: 0 .  losartan (COZAAR) 50 MG  tablet, Take 1 tablet (50 mg total) by mouth daily., Disp: 30 tablet, Rfl: 5 .  metolazone (ZAROXOLYN) 2.5 MG tablet, Take 2.5 mg by mouth as directed. By HF clinic, Disp: , Rfl:  .  torsemide (DEMADEX) 20 MG tablet, Take 40 mg by mouth daily., Disp: , Rfl:  .  traZODone (DESYREL) 100 MG tablet, Take 100 mg by mouth at bedtime as needed for sleep., Disp: , Rfl:  .  DULoxetine (CYMBALTA) 60 MG capsule, Take 1 capsule (60 mg total) by mouth daily. (Patient not taking: Reported on 10/09/2018), Disp: 30 capsule, Rfl: 0 .  folic acid (FOLVITE) 1 MG tablet, TAKE 1 TABLET BY MOUTH EVERY DAY (Patient not taking: Reported on 09/20/2018), Disp: 90 tablet, Rfl: 1 Allergies  Allergen Reactions  . Gabapentin Nausea And Vomiting and Other (See Comments)    POSSIBLE SHAKING? TAKING MED CURRENTLY  . Lyrica [Pregabalin] Other (See Comments)    Shaking Pt still taking lyrica--"probably will stop taking"       Social History   Socioeconomic History  . Marital status: Divorced    Spouse name: Not on file  . Number of children: 1  . Years of education: 53  . Highest education level: 12th grade  Occupational History  . Occupation: disabled  Social Needs  . Financial resource strain: Somewhat hard  . Food insecurity:    Worry: Never true    Inability: Never true  . Transportation needs:    Medical: No    Non-medical: No  Tobacco Use  . Smoking status: Former Smoker    Packs/day: 1.00    Years: 40.00    Pack years: 40.00    Types: E-cigarettes, Cigarettes  . Smokeless tobacco: Former Systems developer    Types: Snuff  . Tobacco comment: Patient denied need  Substance and Sexual Activity  . Alcohol use: Yes    Alcohol/week: 0.0 standard drinks  . Drug use: Yes    Types: "Crack" cocaine, Marijuana, Oxycodone    Comment: 04/29/2015 "last drug use was ~ 09/08/2013"  . Sexual activity: Never  Lifestyle  . Physical activity:    Days per week: Not on file    Minutes per session: Not on file  . Stress: Not  on file  Relationships  . Social connections:    Talks on phone: Not on file    Gets together: Not on file    Attends religious service: Not on file    Active member of club or organization: Not on file    Attends meetings of clubs or organizations: Not on file    Relationship status: Not on file  . Intimate partner violence:    Fear of current or ex partner: Not on file    Emotionally abused: Not on file    Physically abused: Not on file    Forced sexual activity: Not on file  Other Topics Concern  . Not on file  Social History Narrative  Lives in [place], in [place] with sister.  They are looking to move but don't have a place to go yet.      Physical Exam      Future Appointments  Date Time Provider [place]  10/18/2018 10:00 AM [facility]Y [facility] None  10/22/2018  9:00 AM Edrick Kins, DPM [facility]  12/04/2018 11:40 AM Larey Dresser, MD [facility] None    BP (!) 160/84   Pulse 70   Resp 16   Wt 241 lb (109.3 kg)   LMP 11/27/2012   SpO2 96%   BMI 41.37 kg/m   Weight yesterday-242 Last visit weight-244 @ clinic  CBG PTA-318  Pt reports that she is doing ok, no dizziness, no increased sob, pts home health nurse arrived right after I did, pt c/o wound to her foot, home nurse will take a look at that.   meds verified and pill box refilled.  Swelling about the same from last week.  Spoke with the cousin that she is living with about her diet, sounds like she really isnt aware of the sodium content in foods, the nurse is going to print out some papers for heart healthy and diabetic healthy foods and bring them on Thursday when she sees pt again. She ate liver pudding earlier in the day so made them aware that is not a good option for her. The cousin reports she does all the cooking and if she didn't fix it then ms Melder wouldn't eat and she would sit in chair all the time and not do anything. She is still smoking at least a pack a day--reminded them  both of the ncquit line number. Not sure if they are interested in quitting right now.   Need to order on Monday--duloxetine-60mg  Clopidogrel, levothyroxine, torsemide, losartan   Marylouise Stacks, EMT-Paramedic [place] Paramedic  10/09/18

## 2018-10-16 ENCOUNTER — Other Ambulatory Visit (HOSPITAL_COMMUNITY): Payer: Self-pay

## 2018-10-16 NOTE — Progress Notes (Signed)
Paramedicine Encounter    Patient ID: [name], female    DOB: February 16, 1961, 57 y.o.   MRN: [id number]   Patient Care Team: Sandi Mariscal, MD as PCP - General (Internal Medicine) [name], MD as Consulting Physician (Cardiology) [name], MD as Consulting Physician (Pulmonary Disease)  Patient Active Problem List   Diagnosis Date Noted  . AKI (acute kidney injury) ([place]) 08/18/2018  . Coagulation disorder ([place]) 08/09/2017  . Depression 07/21/2017  . At risk for adverse drug reaction 06/20/2017  . Peripheral neuropathy 06/20/2017  . Acute osteomyelitis of right foot ([place]) 06/13/2017  . S/P transmetatarsal amputation of foot, right ([place]) 06/05/2017  . 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 PAD (peripheral artery disease) ([place])   . Sepsis due to other etiology ([place]) 04/06/2017  . CKD (chronic kidney disease), stage III ([place]) 11/24/2016  . Suspected sleep apnea 11/24/2016  . Severe obesity (BMI >= 40) ([place]) 02/24/2016  . COPD GOLD 0  02/24/2016  . Chronic systolic CHF (congestive heart failure) ([place]) 02/12/2016  . Encounter for therapeutic drug monitoring 02/10/2016  . Symptomatic bradycardia 01/12/2016  . Essential hypertension 12/22/2015  . Wheeze   . 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 Tobacco abuse 10/23/2015  . Coronary artery disease   . DOE (dyspnea on exertion) 04/29/2015  . PAF (paroxysmal atrial fibrillation) ([place]) 01/16/2015  . Carotid arterial disease ([place]) 01/16/2015  . Claudication ([place]) 01/15/2015  . Demand ischemia ([place]) 10/29/2014  . Insomnia 02/03/2014  . S/P peripheral artery angioplasty - [place] atherectomy; R SFA 09/11/2013    Class: Acute  . Leg pain, bilateral 08/19/2013  . Hypothyroidism 07/31/2013  . Cellulitis 06/13/2013  . History of cocaine abuse ([place]) 06/13/2013  . Long term current use of anticoagulant therapy 05/20/2013  . Alcohol abuse   . Narcotic  abuse ([place])   . Marijuana abuse   . Alcoholic cirrhosis ([place])   . DM (diabetes mellitus), type 2 with peripheral vascular complications ([name])     Current Outpatient Medications:  .  acetaminophen (TYLENOL) 325 MG tablet, Take 650 mg by mouth every 6 (six) hours as needed for mild pain or moderate pain. , Disp: , Rfl:  .  albuterol (PROVENTIL HFA;VENTOLIN HFA) 108 (90 Base) MCG/ACT inhaler, Inhale 2 puffs into the lungs every 6 (six) hours as needed for wheezing or shortness of breath., Disp: 18 g, Rfl: 0 .  amiodarone (PACERONE) 100 MG tablet, TAKE 1 TABLET (100 MG TOTAL) BY MOUTH DAILY, Disp: 30 tablet, Rfl: 5 .  amLODipine (NORVASC) 10 MG tablet, Take 1 tablet (10 mg total) by mouth daily., Disp: 30 tablet, Rfl: 5 .  apixaban (ELIQUIS) 5 MG TABS tablet, Take 1 tablet (5 mg total) by mouth 2 (two) times daily., Disp: 60 tablet, Rfl: 0 .  atorvastatin (LIPITOR) 40 MG tablet, TAKE 1 TABLET BY MOUTH ONCE EVERY EVENING, Disp: 30 tablet, Rfl: 0 .  bisoprolol (ZEBETA) 10 MG tablet, Take 1 tablet (10 mg total) by mouth daily., Disp: 30 tablet, Rfl: 11 .  Buprenorphine HCl (BELBUCA) 150 MCG FILM, Place 150 mcg inside cheek every 12 (twelve) hours., Disp: , Rfl:  .  cholecalciferol (VITAMIN D) 1000 units tablet, Take 1,000 Units by mouth daily., Disp: , Rfl:  .  clopidogrel (PLAVIX) 75 MG tablet, Take 1 tablet (75 mg total) by mouth daily with breakfast., Disp: 30 tablet, Rfl: 0 .  DULoxetine (CYMBALTA) 30 MG capsule, Take 30  mg by mouth daily. Take with 60 mg for total of 90 mg daily, Disp: , Rfl:  .  DULoxetine (CYMBALTA) 60 MG capsule, Take 1 capsule (60 mg total) by mouth daily., Disp: 30 capsule, Rfl: 0 .  insulin aspart (NOVOLOG) 100 UNIT/ML injection, Inject 30 Units into the skin 3 (three) times daily before meals., Disp: 30 mL, Rfl: 0 .  Insulin Glargine (LANTUS SOLOSTAR) 100 UNIT/ML Solostar Pen, Inject 72 Units into the skin at bedtime., Disp: , Rfl:  .  levothyroxine (SYNTHROID, LEVOTHROID)  50 MCG tablet, Take 1 tablet (50 mcg total) by mouth daily., Disp: 30 tablet, Rfl: 0 .  losartan (COZAAR) 50 MG tablet, Take 1 tablet (50 mg total) by mouth daily., Disp: 30 tablet, Rfl: 5 .  torsemide (DEMADEX) 20 MG tablet, Take 40 mg by mouth daily., Disp: , Rfl:  .  traZODone (DESYREL) 100 MG tablet, Take 100 mg by mouth at bedtime as needed for sleep., Disp: , Rfl:  .  folic acid (FOLVITE) 1 MG tablet, TAKE 1 TABLET BY MOUTH EVERY DAY (Patient not taking: Reported on 09/20/2018), Disp: 90 tablet, Rfl: 1 .  metolazone (ZAROXOLYN) 2.5 MG tablet, Take 2.5 mg by mouth as directed. By HF clinic, Disp: , Rfl:  Allergies  Allergen Reactions  . Gabapentin Nausea And Vomiting and Other (See Comments)    POSSIBLE SHAKING? TAKING MED CURRENTLY  . Lyrica [Pregabalin] Other (See Comments)    Shaking Pt still taking lyrica--"probably will stop taking"       Social History   Socioeconomic History  . Marital status: Divorced    Spouse name: Not on file  . Number of children: 1  . Years of education: 85  . Highest education level: 12th grade  Occupational History  . Occupation: disabled  Social Needs  . Financial resource strain: Somewhat hard  . Food insecurity:    Worry: Never true    Inability: Never true  . Transportation needs:    Medical: No    Non-medical: No  Tobacco Use  . Smoking status: Former Smoker    Packs/day: 1.00    Years: 40.00    Pack years: 40.00    Types: E-cigarettes, Cigarettes  . Smokeless tobacco: Former Systems developer    Types: Snuff  . Tobacco comment: Patient denied need  Substance and Sexual Activity  . Alcohol use: Yes    Alcohol/week: 0.0 standard drinks  . Drug use: Yes    Types: "Crack" cocaine, Marijuana, Oxycodone    Comment: 04/29/2015 "last drug use was ~ 09/08/2013"  . Sexual activity: Never  Lifestyle  . Physical activity:    Days per week: Not on file    Minutes per session: Not on file  . Stress: Not on file  Relationships  . Social  connections:    Talks on phone: Not on file    Gets together: Not on file    Attends religious service: Not on file    Active member of club or organization: Not on file    Attends meetings of clubs or organizations: Not on file    Relationship status: Not on file  . Intimate partner violence:    Fear of current or ex partner: Not on file    Emotionally abused: Not on file    Physically abused: Not on file    Forced sexual activity: Not on file  Other Topics Concern  . Not on file  Social History Narrative   Lives in [place], in [place] with  sister.  They are looking to move but don't have a place to go yet.      Physical Exam      Future Appointments  Date Time Provider [place]  10/18/2018 10:00 AM [facility] None  10/22/2018  9:00 AM Edrick Kins, DPM [facility]  12/04/2018 11:40 AM Larey Dresser, MD [facility] None    BP (!) 164/80   Pulse 86   Resp 15   Wt 241 lb (109.3 kg)   LMP 11/27/2012   SpO2 97%   BMI 41.37 kg/m   Weight yesterday-241 Last visit weight-241  Pt reports feeling ok overall, she is c/o pain to her foot.  No missed doses this week.  Pt had enough meds to do 2 whole days of meds--pharmacy will bring meds out tomor--she has clinic appoint thurs-will refill meds at that time.  Lungs have audible wheeze-pt has not used her inhaler today. She normally uses it daily. Advised her she needed to use it now. B/p elevated. Poor diet. She has pitting edema to her lower legs.  Again, poor diet is in play here. She wishes to get off some of these meds, I told her it would take a big change in her diet and lifestyle for her to begin that journey of wanting to get off some of her meds and it would take a lot of self control and determination.  Ordered meds:losartan, duloxetine 60mg , torsemide, amio, levothyroxine, clopidogrel  Marylouise Stacks, [place] Paramedic  10/16/18

## 2018-10-18 ENCOUNTER — Other Ambulatory Visit (HOSPITAL_COMMUNITY): Payer: Self-pay

## 2018-10-18 ENCOUNTER — Ambulatory Visit (HOSPITAL_COMMUNITY)
Admission: RE | Admit: 2018-10-18 | Discharge: 2018-10-18 | Disposition: A | Discharge status: Home / Self Care | Payer: Medicare HMO | Source: Ambulatory Visit | Attending: Internal Medicine | Admitting: Internal Medicine

## 2018-10-18 VITALS — BP 148/88 | HR 75 | Wt 246.0 lb

## 2018-10-18 DIAGNOSIS — I130 Hypertensive heart and chronic kidney disease with heart failure and stage 1 through stage 4 chronic kidney disease, or unspecified chronic kidney disease: Secondary | ICD-10-CM | POA: Diagnosis not present

## 2018-10-18 DIAGNOSIS — E1151 Type 2 diabetes mellitus with diabetic peripheral angiopathy without gangrene: Secondary | ICD-10-CM

## 2018-10-18 DIAGNOSIS — I5022 Chronic systolic (congestive) heart failure: Secondary | ICD-10-CM | POA: Insufficient documentation

## 2018-10-18 DIAGNOSIS — I10 Essential (primary) hypertension: Secondary | ICD-10-CM

## 2018-10-18 DIAGNOSIS — R0602 Shortness of breath: Secondary | ICD-10-CM | POA: Diagnosis not present

## 2018-10-18 LAB — BASIC METABOLIC PANEL
ANION GAP: 10 (ref 5–15)
BUN: 19 mg/dL (ref 6–20)
CALCIUM: 8.8 mg/dL — AB (ref 8.9–10.3)
CO2: 23 mmol/L (ref 22–32)
Chloride: 110 mmol/L (ref 98–111)
Creatinine, Ser: 1.33 mg/dL — ABNORMAL HIGH (ref 0.44–1.00)
GFR calc non Af Amer: 45 mL/min — ABNORMAL LOW (ref >60)
GFR, EST AFRICAN AMERICAN: 52 mL/min — AB (ref >60)
Glucose, Bld: 104 mg/dL — ABNORMAL HIGH (ref 70–99)
Potassium: 3.8 mmol/L (ref 3.5–5.1)
Sodium: 143 mmol/L (ref 135–145)

## 2018-10-18 LAB — BRAIN NATRIURETIC PEPTIDE: B NATRIURETIC PEPTIDE 5: 410 pg/mL — AB (ref 0.0–100.0)

## 2018-10-18 MED ORDER — LOSARTAN POTASSIUM 100 MG PO TABS: 100.0000 mg | ORAL_TABLET | Freq: Every day | ORAL | 5 refills | Status: DC

## 2018-10-18 MED ORDER — NICOTINE 21 MG/24HR TD PT24: 21.0000 mg | MEDICATED_PATCH | Freq: Every day | TRANSDERMAL | 0 refills | Status: DC

## 2018-10-18 NOTE — Progress Notes (Signed)
Paramedicine Encounter   Patient ID: [name] , female,   DOB: 1961/01/14,56 y.o.,  MRN: [id number]   Met patient in clinic today with provider.  Time spent with patient 74mn   Pt reports her legs are hurting very bad-pins and needle feeling and blister -like feeling as well. She has not been able to tolerate the gabapentin nor lyrica-it causes her to shake. Her losartan is being increased to 1026mdaily.  She brought her meds so changes were made today.  Will see her again next week on Thursday.  She goes to PCP this week. She has access to transportation thru her insurance. She states her cousin has told her the landlord about no smoke detectors but no repsonse back and that has been ongoing for a while now. jeEliezer Loftsill contact fire dept regarding this.   KaMarylouise StacksEMOcean Pointe2/03/2018

## 2018-10-18 NOTE — Progress Notes (Signed)
Paramedicine Initial Assessment:  Housing:  In what kind of housing do you live? House/apt/trailer/shelter?  apt  Do you rent/pay a mortgage/own? rent  Do you live with anyone? cousin  Are you currently worried about losing your housing? No- but worried about housing safety as she has mobility issues and they live on the 2nd floor- have started looking into getting a one story house.  Within the past 12 months have you ever stayed outside, in a car, tent, a shelter, or temporarily with someone? no  Within the past 12 months have you been unable to get utilities when it was really needed? no  Social:  What is your current marital status? divorced  Do you have any children?1 daughter  Do you have family or friends who live locally? Lives in [place], [place].  Per pt she is disabled and lives in a group home but they are able to see each other regularly.  Food:  Within the past 12 months were you ever worried that food would run out before you got money to buy more? No.  Within the past 47months have you run out of food and didn't have money to buy more?No.  Pt is very familiar with food pantries and if they run out of money between disability and food stamps ($30/month) they utilize the local pantries.  Income:  What is your current source of income? Disability  How hard is it for you to pay for the basics like food housing, medical care, and utilities? Somewhat hard   Insurance:  Are you currently insured? Yes medicare   Do you have prescription coverage? yes  Transportation:  Do you have transportation to your medical appointments? Yes   If yes, how? Gets a ride from her cousin or can use rides through her insurance policy.  In the past 12 months has lack of transportation kept you from medical appts or from getting medications? No  In the past 12 months has lack of transportation kept you from meetings, work, or getting things you needed? No   Daily Health Needs: Do you  have a working scale at home? Yes  How do you manage your medications at home? Pillbox/paramedicine  Do you ever take your medications differently than prescribed? no  Do you have issues affording your medications? no  Do you have any concerns with mobility at home? Yes- uses wheelchair or walker.  Has nerve pain in her legs which makes moving around difficult.  Are there any additional barriers you see to getting the care you need? Pt also expresses concerns about lack of smoke detectors in her apartment- states she has discussed this with her landlord and nothing has been done.  CSW to try and follow up with firedept about getting smoke detectors installed.  CSW will continue to follow through paramedicine program and assist as needed.  Jorge Ny, LCSW Clinical Social Worker [facility] [phone]

## 2018-10-18 NOTE — Progress Notes (Signed)
HF MD: [facility]  HPI:  [name] is a 57 year old with history of PAD, carotid stenosis s/p left CEA, relatively mild CAD, chronic systolic CHF, paroxysmal atrial fibrillation and prior substance abuse presents for CHF clinic followup. She was admitted in 1/17 with acute hypoxemic respiratory failure in the setting of atrial fibrillation/RVR and volume overload. She was initially intubated. She converted back to NSR with amiodarone gtt. She was treated with IV Lasix, steroids, bronchodilators. She developed AKI and losartan was stopped.   She was admitted in 3/17 with symptomatic bradycardia. She was supposed to stop diltiazem after this admission but continued to take it. She presented later in 3/17, again with symptomatic bradycardia (junctional bradycardia), hypotension, and AKI. Diltiazem and bisoprolol were stopped. HR recovered and she has had no problems since. ACEI was stopped with AKI.   In 5/18, she had peripheral angiography with atherectomy of right SFA. 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 7/18 ABIs showed 0.65 on right, 0.31 on left.   Admitted 10/5through 08/19/2018 with alcohol intoxication and passed out in the parking lot. EMS was called. She did not have any fractures. Torsemide, spironolactone, metolazone was stopped due to AKI. She was discharged the next day.   Today she presents with [name] (paramedicine) for pharmacist-led HF medication titration. At last HF clinic visit on 11/20, her losartan was increased to 50 mg daily. Overall feeling fine.Continues to have a cough but it's getting better since decreasing the amount of cigarettes she's smoking to 1/2 ppd from 1 ppd. She does not use oxygen any more.Appetite ok. No fever or chills. Taking all medications now and reports great compliance. [name] is filling her pillbox weekly for her. She has not any alcohol since she 08/18/2018.  Lives with her cousin.She  requires assistance with transportation.   Shortness of breath/dyspnea on exertion? no   Orthopnea/PND? Yes - 2 pillows stable  Edema? Yes - trace  Lightheadedness/dizziness? no  Daily weights at home? Yes - usually stable at 241 lb  Blood pressure/heart rate monitoring at home? Yes - HHRN and [name] usually elevated  Following low-sodium/fluid-restricted diet? yes  HF Medications: Bisoprolol 10 mg PO daily Losartan 50 mg PO daily Torsemide 40 mg PO daily   Has the patient been experiencing any side effects to the medications prescribed?  no  Does the patient have any problems obtaining medications due to transportation or finances?   No - [employer] Part D  Understanding of regimen: fair Understanding of indications: fair Potential of compliance: fair Patient understands to avoid NSAIDs. Patient understands to avoid decongestants.   Pertinent Lab Values:  10/18/18: Serum creatinine 1.33 (BL 1.3-1.5), BUN 19, Potassium 3.8, Sodium 143, BNP 410  Vital Signs:  Weight: 246 lb (dry weight: 248 lb)  Blood pressure: 148/88 mmHg   Heart rate: 75 bpm   Assessment: 1. ChronicHFpEF borderline (EF 40-45%), due to NICM. NYHA class IIIsymptoms.  - Volume status improved today with increased torsemide 40 mg daily  - Basic disease state pathophysiology, medication indication, mechanism and side effects reviewed at length with patient and she verbalized understanding  2. Atrial fibrillation: Paroxysmal.  - Continue amiodarone 100 mg daily. LFTs stable earlier this month. PCP following hypothyroidism (on Levoxyl). Needs regular eye exams. - Regular pulse.  - Continue eliquis 5 mg twice a day. No bleeding problems.  - May need to stop if she continues to have falls. Currently not drinking alcohol.  3. VZC:HYIFOYDXAJOI on  left seems limiting. No ulcerations. She has foot pain almost constantly. Not candidate for cilostazol with CHF. Most recent peripheral  intervention was on the right in 5/18 (she later had right transmetatarsal amputation).   4. COPD: - No wheeze. Discussed smoking cessaton.  5. H/o cirrhosis:  - Likely from ETOH, Now abstinent.   6. OSA: - Unable to tolerate CPAP.   7. CKD stage III:  - AKI resolved, rechecking BMET today  8. HTN:  - BP above goal <130/80 mmHg - Increase losartan to 100 mg daily  - Continue amlodipine 10 mg daily and bisoprolol 10 mg daily - Will attempt restart spironolactone at next visit as long as AKI has completely resolved  9. Bradycardia:  - Junctional rhythm when hospitalized in 3/17. - Resolved  10. Tobacco Abuse  - Smoking 1/2 ppd, down from 1ppd (at the most was smoking 2 ppd)  - Will send nicotine patches in for her to try  Plan: 1) Medication changes: Based on clinical presentation, vital signs and recent labs will increase losartan to 100 mg daily and start nicotine patches 21 mg/d 2) Labs: BMET today and in 2 weeks 3) Follow-up: Dr. Aundra Dubin on 12/04/18   [name], PharmD, BCPS, CPP Clinical Pharmacist Phone: [phone] 10/18/2018 10:01 AM

## 2018-10-18 NOTE — Patient Instructions (Addendum)
It was great to see you today!  Please INCREASE your losartan to 100 mg ONCE DAILY.   Blood work today. We will call you with any changes.   You are scheduled for blood work again on 12/19 at 9:15 am.   Please keep your appointment with Dr. Aundra Dubin on 12/04/18.

## 2018-10-21 ENCOUNTER — Other Ambulatory Visit: Payer: Self-pay

## 2018-10-21 ENCOUNTER — Inpatient Hospital Stay (HOSPITAL_COMMUNITY)
Admission: EM | Admit: 2018-10-21 | Discharge: 2018-10-25 | DRG: 291 | Disposition: A | Discharge status: Home Health | Payer: Medicare HMO | Attending: Internal Medicine | Admitting: Internal Medicine

## 2018-10-21 ENCOUNTER — Encounter (HOSPITAL_COMMUNITY): Payer: Self-pay

## 2018-10-21 ENCOUNTER — Emergency Department (HOSPITAL_COMMUNITY): Payer: Medicare HMO

## 2018-10-21 DIAGNOSIS — N183 Chronic kidney disease, stage 3 (moderate): Secondary | ICD-10-CM | POA: Diagnosis present

## 2018-10-21 DIAGNOSIS — Z79899 Other long term (current) drug therapy: Secondary | ICD-10-CM

## 2018-10-21 DIAGNOSIS — I480 Paroxysmal atrial fibrillation: Secondary | ICD-10-CM | POA: Diagnosis present

## 2018-10-21 DIAGNOSIS — I5023 Acute on chronic systolic (congestive) heart failure: Secondary | ICD-10-CM

## 2018-10-21 DIAGNOSIS — Z6841 Body Mass Index (BMI) 40.0 and over, adult: Secondary | ICD-10-CM

## 2018-10-21 DIAGNOSIS — R6510 Systemic inflammatory response syndrome (SIRS) of non-infectious origin without acute organ dysfunction: Secondary | ICD-10-CM | POA: Diagnosis present

## 2018-10-21 DIAGNOSIS — L03115 Cellulitis of right lower limb: Secondary | ICD-10-CM | POA: Diagnosis present

## 2018-10-21 DIAGNOSIS — J449 Chronic obstructive pulmonary disease, unspecified: Secondary | ICD-10-CM

## 2018-10-21 DIAGNOSIS — J9600 Acute respiratory failure, unspecified whether with hypoxia or hypercapnia: Secondary | ICD-10-CM | POA: Diagnosis present

## 2018-10-21 DIAGNOSIS — L03116 Cellulitis of left lower limb: Secondary | ICD-10-CM | POA: Diagnosis present

## 2018-10-21 DIAGNOSIS — E785 Hyperlipidemia, unspecified: Secondary | ICD-10-CM | POA: Diagnosis present

## 2018-10-21 DIAGNOSIS — I5043 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) and diastolic (congestive) heart failure: Secondary | ICD-10-CM | POA: Diagnosis present

## 2018-10-21 DIAGNOSIS — L03119 Cellulitis of unspecified part of limb: Secondary | ICD-10-CM | POA: Diagnosis not present

## 2018-10-21 DIAGNOSIS — F17210 Nicotine dependence, cigarettes, uncomplicated: Secondary | ICD-10-CM | POA: Diagnosis present

## 2018-10-21 DIAGNOSIS — E039 Hypothyroidism, unspecified: Secondary | ICD-10-CM | POA: Diagnosis present

## 2018-10-21 DIAGNOSIS — I2510 Atherosclerotic heart disease of native coronary artery without angina pectoris: Secondary | ICD-10-CM | POA: Diagnosis present

## 2018-10-21 DIAGNOSIS — K7030 Alcoholic cirrhosis of liver without ascites: Secondary | ICD-10-CM | POA: Diagnosis present

## 2018-10-21 DIAGNOSIS — E119 Type 2 diabetes mellitus without complications: Secondary | ICD-10-CM | POA: Diagnosis present

## 2018-10-21 DIAGNOSIS — Z7901 Long term (current) use of anticoagulants: Secondary | ICD-10-CM

## 2018-10-21 DIAGNOSIS — I340 Nonrheumatic mitral (valve) insufficiency: Secondary | ICD-10-CM | POA: Diagnosis not present

## 2018-10-21 DIAGNOSIS — D649 Anemia, unspecified: Secondary | ICD-10-CM | POA: Diagnosis present

## 2018-10-21 DIAGNOSIS — I5022 Chronic systolic (congestive) heart failure: Secondary | ICD-10-CM | POA: Diagnosis not present

## 2018-10-21 DIAGNOSIS — I429 Cardiomyopathy, unspecified: Secondary | ICD-10-CM | POA: Diagnosis present

## 2018-10-21 DIAGNOSIS — E1151 Type 2 diabetes mellitus with diabetic peripheral angiopathy without gangrene: Secondary | ICD-10-CM | POA: Diagnosis present

## 2018-10-21 DIAGNOSIS — I361 Nonrheumatic tricuspid (valve) insufficiency: Secondary | ICD-10-CM | POA: Diagnosis not present

## 2018-10-21 DIAGNOSIS — F1010 Alcohol abuse, uncomplicated: Secondary | ICD-10-CM | POA: Diagnosis present

## 2018-10-21 DIAGNOSIS — Z79890 Hormone replacement therapy (postmenopausal): Secondary | ICD-10-CM

## 2018-10-21 DIAGNOSIS — E1165 Type 2 diabetes mellitus with hyperglycemia: Secondary | ICD-10-CM | POA: Diagnosis present

## 2018-10-21 DIAGNOSIS — Z794 Long term (current) use of insulin: Secondary | ICD-10-CM

## 2018-10-21 DIAGNOSIS — J9601 Acute respiratory failure with hypoxia: Secondary | ICD-10-CM | POA: Diagnosis present

## 2018-10-21 DIAGNOSIS — Z7902 Long term (current) use of antithrombotics/antiplatelets: Secondary | ICD-10-CM

## 2018-10-21 DIAGNOSIS — Z833 Family history of diabetes mellitus: Secondary | ICD-10-CM

## 2018-10-21 DIAGNOSIS — E1142 Type 2 diabetes mellitus with diabetic polyneuropathy: Secondary | ICD-10-CM | POA: Diagnosis present

## 2018-10-21 DIAGNOSIS — I130 Hypertensive heart and chronic kidney disease with heart failure and stage 1 through stage 4 chronic kidney disease, or unspecified chronic kidney disease: Principal | ICD-10-CM | POA: Diagnosis present

## 2018-10-21 DIAGNOSIS — E1169 Type 2 diabetes mellitus with other specified complication: Secondary | ICD-10-CM | POA: Diagnosis present

## 2018-10-21 DIAGNOSIS — E1122 Type 2 diabetes mellitus with diabetic chronic kidney disease: Secondary | ICD-10-CM | POA: Diagnosis present

## 2018-10-21 DIAGNOSIS — J441 Chronic obstructive pulmonary disease with (acute) exacerbation: Secondary | ICD-10-CM

## 2018-10-21 DIAGNOSIS — R0602 Shortness of breath: Secondary | ICD-10-CM | POA: Diagnosis present

## 2018-10-21 DIAGNOSIS — K219 Gastro-esophageal reflux disease without esophagitis: Secondary | ICD-10-CM | POA: Diagnosis present

## 2018-10-21 DIAGNOSIS — N179 Acute kidney failure, unspecified: Secondary | ICD-10-CM | POA: Diagnosis present

## 2018-10-21 DIAGNOSIS — Z9981 Dependence on supplemental oxygen: Secondary | ICD-10-CM

## 2018-10-21 DIAGNOSIS — L0390 Cellulitis, unspecified: Secondary | ICD-10-CM | POA: Diagnosis present

## 2018-10-21 HISTORY — DX: Cellulitis, unspecified: L03.90

## 2018-10-21 LAB — BASIC METABOLIC PANEL
Anion gap: 12 (ref 5–15)
Anion gap: 7 (ref 5–15)
BUN: 23 mg/dL — ABNORMAL HIGH (ref 6–20)
BUN: 33 mg/dL — ABNORMAL HIGH (ref 6–20)
CALCIUM: 8.8 mg/dL — AB (ref 8.9–10.3)
CHLORIDE: 100 mmol/L (ref 98–111)
CO2: 22 mmol/L (ref 22–32)
CO2: 25 mmol/L (ref 22–32)
CREATININE: 1.07 mg/dL — AB (ref 0.44–1.00)
Calcium: 8.6 mg/dL — ABNORMAL LOW (ref 8.9–10.3)
Chloride: 104 mmol/L (ref 98–111)
Creatinine, Ser: 1.28 mg/dL — ABNORMAL HIGH (ref 0.44–1.00)
GFR calc Af Amer: 54 mL/min — ABNORMAL LOW (ref >60)
GFR calc non Af Amer: 58 mL/min — ABNORMAL LOW (ref >60)
GFR calc non Af Amer: 47 mL/min — ABNORMAL LOW (ref >60)
Glucose, Bld: 353 mg/dL — ABNORMAL HIGH (ref 70–99)
Glucose, Bld: 476 mg/dL — ABNORMAL HIGH (ref 70–99)
Potassium: 4.1 mmol/L (ref 3.5–5.1)
Potassium: 4.5 mmol/L (ref 3.5–5.1)
SODIUM: 138 mmol/L (ref 135–145)
Sodium: 132 mmol/L — ABNORMAL LOW (ref 135–145)

## 2018-10-21 LAB — RAPID URINE DRUG SCREEN, HOSP PERFORMED
Amphetamines: NOT DETECTED
Barbiturates: NOT DETECTED
Benzodiazepines: NOT DETECTED
Cocaine: NOT DETECTED
Opiates: NOT DETECTED
Tetrahydrocannabinol: NOT DETECTED

## 2018-10-21 LAB — I-STAT TROPONIN, ED: TROPONIN I, POC: 0 ng/mL (ref 0.00–0.08)

## 2018-10-21 LAB — PROCALCITONIN

## 2018-10-21 LAB — TSH: TSH: 1.157 u[IU]/mL (ref 0.350–4.500)

## 2018-10-21 LAB — GLUCOSE, CAPILLARY
Glucose-Capillary: 418 mg/dL — ABNORMAL HIGH (ref 70–99)
Glucose-Capillary: 240 mg/dL — ABNORMAL HIGH (ref 70–99)

## 2018-10-21 LAB — HEMOGLOBIN AND HEMATOCRIT, BLOOD
HCT: 31.4 % — ABNORMAL LOW (ref 36.0–46.0)
HEMOGLOBIN: 9.1 g/dL — AB (ref 12.0–15.0)

## 2018-10-21 LAB — CBC WITH DIFFERENTIAL/PLATELET
Abs Immature Granulocytes: 0.12 10*3/uL — ABNORMAL HIGH (ref 0.00–0.07)
BASOS ABS: 0 10*3/uL (ref 0.0–0.1)
BASOS PCT: 0 %
EOS ABS: 0.1 10*3/uL (ref 0.0–0.5)
EOS PCT: 1 %
HCT: 32.8 % — ABNORMAL LOW (ref 36.0–46.0)
Hemoglobin: 9.6 g/dL — ABNORMAL LOW (ref 12.0–15.0)
Immature Granulocytes: 1 %
Lymphocytes Relative: 10 %
Lymphs Abs: 1.6 10*3/uL (ref 0.7–4.0)
MCH: 28.6 pg (ref 26.0–34.0)
MCHC: 29.3 g/dL — AB (ref 30.0–36.0)
MCV: 97.6 fL (ref 80.0–100.0)
Monocytes Absolute: 0.9 10*3/uL (ref 0.1–1.0)
Monocytes Relative: 5 %
NEUTROS PCT: 83 %
NRBC: 0 % (ref 0.0–0.2)
Neutro Abs: 14.1 10*3/uL — ABNORMAL HIGH (ref 1.7–7.7)
PLATELETS: 331 10*3/uL (ref 150–400)
RBC: 3.36 MIL/uL — AB (ref 3.87–5.11)
RDW: 15.7 % — AB (ref 11.5–15.5)
WBC: 16.9 10*3/uL — AB (ref 4.0–10.5)

## 2018-10-21 LAB — C-REACTIVE PROTEIN: CRP: 2.9 mg/dL — ABNORMAL HIGH (ref <1.0)

## 2018-10-21 LAB — CBG MONITORING, ED
Glucose-Capillary: 393 mg/dL — ABNORMAL HIGH (ref 70–99)
Glucose-Capillary: 416 mg/dL — ABNORMAL HIGH (ref 70–99)

## 2018-10-21 LAB — ETHANOL

## 2018-10-21 LAB — LACTIC ACID, PLASMA: Lactic Acid, Venous: 1.1 mmol/L (ref 0.5–1.9)

## 2018-10-21 LAB — BRAIN NATRIURETIC PEPTIDE: B NATRIURETIC PEPTIDE 5: 548.1 pg/mL — AB (ref 0.0–100.0)

## 2018-10-21 LAB — HEMOGLOBIN A1C
Hgb A1c MFr Bld: 7.1 % — ABNORMAL HIGH (ref 4.8–5.6)
Mean Plasma Glucose: 157.07 mg/dL

## 2018-10-21 MED ORDER — ATORVASTATIN CALCIUM 40 MG PO TABS
40.0000 mg | ORAL_TABLET | Freq: Every day | ORAL | Status: DC
  Administered 2018-10-21 – 2018-10-25 (×5): 40 mg via ORAL
  Filled 2018-10-21 (×5): qty 1

## 2018-10-21 MED ORDER — FUROSEMIDE 10 MG/ML IJ SOLN
80.0000 mg | Freq: Once | INTRAMUSCULAR | Status: AC
  Administered 2018-10-21: 80 mg via INTRAVENOUS
  Filled 2018-10-21: qty 8

## 2018-10-21 MED ORDER — IPRATROPIUM-ALBUTEROL 0.5-2.5 (3) MG/3ML IN SOLN
3.0000 mL | Freq: Once | RESPIRATORY_TRACT | Status: AC
  Administered 2018-10-21: 3 mL via RESPIRATORY_TRACT
  Filled 2018-10-21: qty 3

## 2018-10-21 MED ORDER — INSULIN ASPART 100 UNIT/ML ~~LOC~~ SOLN: 0.0000 [IU] | Freq: Three times a day (TID) | SUBCUTANEOUS | Status: DC

## 2018-10-21 MED ORDER — FUROSEMIDE 10 MG/ML IJ SOLN
40.0000 mg | Freq: Two times a day (BID) | INTRAMUSCULAR | Status: DC
  Administered 2018-10-21 – 2018-10-25 (×8): 40 mg via INTRAVENOUS
  Filled 2018-10-21 (×8): qty 4

## 2018-10-21 MED ORDER — AMLODIPINE BESYLATE 10 MG PO TABS
10.0000 mg | ORAL_TABLET | Freq: Every day | ORAL | Status: DC
  Administered 2018-10-21 – 2018-10-25 (×5): 10 mg via ORAL
  Filled 2018-10-21: qty 2
  Filled 2018-10-21 (×4): qty 1

## 2018-10-21 MED ORDER — SODIUM CHLORIDE 0.9 % IV SOLN
1.0000 g | Freq: Three times a day (TID) | INTRAVENOUS | Status: DC
  Administered 2018-10-21: 1 g via INTRAVENOUS
  Filled 2018-10-21 (×2): qty 1

## 2018-10-21 MED ORDER — SODIUM CHLORIDE 0.9% FLUSH
3.0000 mL | Freq: Two times a day (BID) | INTRAVENOUS | Status: DC
  Administered 2018-10-22 – 2018-10-25 (×7): 3 mL via INTRAVENOUS

## 2018-10-21 MED ORDER — AMIODARONE HCL 100 MG PO TABS
100.0000 mg | ORAL_TABLET | Freq: Every day | ORAL | Status: DC
  Administered 2018-10-21 – 2018-10-25 (×5): 100 mg via ORAL
  Filled 2018-10-21 (×5): qty 1

## 2018-10-21 MED ORDER — SODIUM CHLORIDE 0.9 % IV SOLN: 250.0000 mL | INTRAVENOUS | Status: DC | PRN

## 2018-10-21 MED ORDER — VANCOMYCIN HCL 10 G IV SOLR
2000.0000 mg | Freq: Once | INTRAVENOUS | Status: AC
  Administered 2018-10-21: 2000 mg via INTRAVENOUS
  Filled 2018-10-21: qty 2000

## 2018-10-21 MED ORDER — GUAIFENESIN ER 600 MG PO TB12
600.0000 mg | ORAL_TABLET | Freq: Two times a day (BID) | ORAL | Status: DC
  Administered 2018-10-21 – 2018-10-25 (×9): 600 mg via ORAL
  Filled 2018-10-21 (×9): qty 1

## 2018-10-21 MED ORDER — TRAZODONE HCL 100 MG PO TABS: 100.0000 mg | ORAL_TABLET | Freq: Every evening | ORAL | Status: DC | PRN

## 2018-10-21 MED ORDER — SODIUM CHLORIDE 0.9% FLUSH: 3.0000 mL | INTRAVENOUS | Status: DC | PRN

## 2018-10-21 MED ORDER — LORAZEPAM 1 MG PO TABS
1.0000 mg | ORAL_TABLET | Freq: Four times a day (QID) | ORAL | Status: AC | PRN
  Administered 2018-10-21 – 2018-10-23 (×3): 1 mg via ORAL
  Filled 2018-10-21 (×3): qty 1

## 2018-10-21 MED ORDER — AZITHROMYCIN 250 MG PO TABS: 250.0000 mg | ORAL_TABLET | Freq: Every day | ORAL | Status: DC

## 2018-10-21 MED ORDER — LORAZEPAM 2 MG/ML IJ SOLN: 1.0000 mg | Freq: Four times a day (QID) | INTRAMUSCULAR | Status: AC | PRN

## 2018-10-21 MED ORDER — ONDANSETRON HCL 4 MG/2ML IJ SOLN: 4.0000 mg | Freq: Four times a day (QID) | INTRAMUSCULAR | Status: DC | PRN

## 2018-10-21 MED ORDER — BISOPROLOL FUMARATE 5 MG PO TABS
10.0000 mg | ORAL_TABLET | Freq: Every day | ORAL | Status: DC
  Administered 2018-10-21 – 2018-10-25 (×5): 10 mg via ORAL
  Filled 2018-10-21 (×2): qty 2
  Filled 2018-10-21: qty 1
  Filled 2018-10-21 (×2): qty 2

## 2018-10-21 MED ORDER — NICOTINE 21 MG/24HR TD PT24
21.0000 mg | MEDICATED_PATCH | Freq: Every day | TRANSDERMAL | Status: DC
  Administered 2018-10-21 – 2018-10-25 (×5): 21 mg via TRANSDERMAL
  Filled 2018-10-21 (×5): qty 1

## 2018-10-21 MED ORDER — INSULIN ASPART 100 UNIT/ML ~~LOC~~ SOLN
0.0000 [IU] | Freq: Three times a day (TID) | SUBCUTANEOUS | Status: DC
  Administered 2018-10-22 (×2): 7 [IU] via SUBCUTANEOUS
  Administered 2018-10-23: 11 [IU] via SUBCUTANEOUS
  Administered 2018-10-23: 7 [IU] via SUBCUTANEOUS
  Administered 2018-10-23: 11 [IU] via SUBCUTANEOUS
  Administered 2018-10-24: 7 [IU] via SUBCUTANEOUS
  Administered 2018-10-24: 4 [IU] via SUBCUTANEOUS
  Administered 2018-10-25: 3 [IU] via SUBCUTANEOUS

## 2018-10-21 MED ORDER — INSULIN GLARGINE 100 UNIT/ML ~~LOC~~ SOLN
72.0000 [IU] | Freq: Every day | SUBCUTANEOUS | Status: DC
  Administered 2018-10-21: 72 [IU] via SUBCUTANEOUS
  Filled 2018-10-21 (×2): qty 0.72

## 2018-10-21 MED ORDER — ACETAMINOPHEN 325 MG PO TABS
650.0000 mg | ORAL_TABLET | ORAL | Status: DC | PRN
  Administered 2018-10-21 – 2018-10-25 (×9): 650 mg via ORAL
  Filled 2018-10-21 (×8): qty 2

## 2018-10-21 MED ORDER — INSULIN ASPART 100 UNIT/ML ~~LOC~~ SOLN
0.0000 [IU] | Freq: Every day | SUBCUTANEOUS | Status: DC
  Administered 2018-10-21: 2 [IU] via SUBCUTANEOUS
  Administered 2018-10-22: 5 [IU] via SUBCUTANEOUS
  Administered 2018-10-24: 2 [IU] via SUBCUTANEOUS

## 2018-10-21 MED ORDER — FOLIC ACID 1 MG PO TABS
1.0000 mg | ORAL_TABLET | Freq: Every day | ORAL | Status: DC
  Administered 2018-10-21 – 2018-10-25 (×5): 1 mg via ORAL
  Filled 2018-10-21 (×5): qty 1

## 2018-10-21 MED ORDER — INSULIN ASPART 100 UNIT/ML ~~LOC~~ SOLN
30.0000 [IU] | Freq: Three times a day (TID) | SUBCUTANEOUS | Status: DC
  Administered 2018-10-21 – 2018-10-25 (×12): 30 [IU] via SUBCUTANEOUS

## 2018-10-21 MED ORDER — ADULT MULTIVITAMIN W/MINERALS CH
1.0000 | ORAL_TABLET | Freq: Every day | ORAL | Status: DC
  Administered 2018-10-21 – 2018-10-25 (×5): 1 via ORAL
  Filled 2018-10-21 (×5): qty 1

## 2018-10-21 MED ORDER — VANCOMYCIN HCL 10 G IV SOLR
1250.0000 mg | Freq: Two times a day (BID) | INTRAVENOUS | Status: DC
  Filled 2018-10-21: qty 1250

## 2018-10-21 MED ORDER — LEVOTHYROXINE SODIUM 50 MCG PO TABS
50.0000 ug | ORAL_TABLET | Freq: Every day | ORAL | Status: DC
  Administered 2018-10-21 – 2018-10-25 (×5): 50 ug via ORAL
  Filled 2018-10-21 (×6): qty 1

## 2018-10-21 MED ORDER — IPRATROPIUM-ALBUTEROL 0.5-2.5 (3) MG/3ML IN SOLN
3.0000 mL | Freq: Four times a day (QID) | RESPIRATORY_TRACT | Status: DC
  Administered 2018-10-21 – 2018-10-22 (×8): 3 mL via RESPIRATORY_TRACT
  Filled 2018-10-21 (×8): qty 3

## 2018-10-21 MED ORDER — LOSARTAN POTASSIUM 50 MG PO TABS
100.0000 mg | ORAL_TABLET | Freq: Every day | ORAL | Status: DC
  Administered 2018-10-21 – 2018-10-25 (×5): 100 mg via ORAL
  Filled 2018-10-21 (×6): qty 2

## 2018-10-21 MED ORDER — THIAMINE HCL 100 MG/ML IJ SOLN: 100.0000 mg | Freq: Every day | INTRAMUSCULAR | Status: DC

## 2018-10-21 MED ORDER — SODIUM CHLORIDE 0.9% FLUSH
3.0000 mL | Freq: Two times a day (BID) | INTRAVENOUS | Status: DC
  Administered 2018-10-22 – 2018-10-23 (×4): 3 mL via INTRAVENOUS

## 2018-10-21 MED ORDER — SODIUM CHLORIDE 0.9 % IV SOLN
2.0000 g | Freq: Once | INTRAVENOUS | Status: AC
  Administered 2018-10-21: 2 g via INTRAVENOUS
  Filled 2018-10-21: qty 2

## 2018-10-21 MED ORDER — AZITHROMYCIN 500 MG PO TABS: 500.0000 mg | ORAL_TABLET | Freq: Every day | ORAL | Status: DC

## 2018-10-21 MED ORDER — VITAMIN B-1 100 MG PO TABS
100.0000 mg | ORAL_TABLET | Freq: Every day | ORAL | Status: DC
  Administered 2018-10-21 – 2018-10-25 (×5): 100 mg via ORAL
  Filled 2018-10-21 (×5): qty 1

## 2018-10-21 MED ORDER — DOXYCYCLINE HYCLATE 100 MG PO TABS
100.0000 mg | ORAL_TABLET | Freq: Two times a day (BID) | ORAL | Status: DC
  Administered 2018-10-21 – 2018-10-25 (×8): 100 mg via ORAL
  Filled 2018-10-21 (×8): qty 1

## 2018-10-21 NOTE — ED Notes (Signed)
RN & Tech took Pt off of Purwick to place Pt in hallway within 2 minutes Pt stated she had to urinate again. Pt stated she feels like she is constantly going. Just in the amount of time it took to quickly get a new puriwick Pt had already urinated in the diaper. Cleaned Pt and Placed Pt back on Puriwick with a clean diaper on as well.

## 2018-10-21 NOTE — H&P (Signed)
History and Physical    [name]:[id number] DOB: [birth year] DOA: 10/21/2018  Referring MD/NP/PA: Dr. Roxanne Mins PCP: Sandi Mariscal, MD  Patient coming from: Home via EMS  Chief Complaint: Shortness of breath  I have personally briefly reviewed patient's old medical records in [place]   HPI: [name] is a 57 y.o. female with medical history significant of COPD, combined diastolic and systolic CHF, PAF, DM type II, PVD, s/p right foot transmetatarsal amputation, tobacco abuse, alcohol abuse, hypothyroidism; who presents with complaints of progressively worsening shortness of breath over the last 1 week.   Complaints of worsening lower extremity swelling, orthopnea, minimally productive cough, intermittent chills, and nausea.  Unsure if her weight is up or down.  Tried utilizing breathing treatments at home without relief of symptoms.  Denies having any fever, vomiting, diarrhea, blood in stool, or recent sick contacts.  She notes scratches and wounds of her legs that she sustained from her dogs.  Notes that they have become more red in appearance.  She also admits to drinking alcohol intermittently, but reports that she does not drink on daily basis.  She still smokes about 1 pack of cigarettes per day on average and normally does require oxygen.   Her blood sugars at home have been running on average in the 200- 300s.  ED Course: Upon admission to the emergency department no initial temperature was obtained.  Patient was noted to be tachypneic up into the 30s and O2 saturations maintained on 2 L of nasal cannula oxygen.  Labs revealed WBC 16.9, hemoglobin 9.6, BUN 23, creatinine 1.07, glucose 353, troponin 0, and BNP 548.  Patient was given 80 mg of Lasix IV and albuterol breathing treatment.  TRH called to admit for acute respiratory failure from suspected COPD and heart failure exacerbation  Review of Systems  Constitutional: Positive for chills and malaise/fatigue. Negative for  fever.  HENT: Negative for hearing loss.   Eyes: Negative for pain and discharge.  Respiratory: Positive for cough, sputum production, shortness of breath and wheezing. Negative for stridor.   Cardiovascular: Positive for orthopnea and leg swelling. Negative for chest pain.  Gastrointestinal: Positive for nausea. Negative for abdominal pain and diarrhea.  Genitourinary: Negative for dysuria and hematuria.  Musculoskeletal: Positive for joint pain and myalgias.  Skin:       Positive for skin color change  Neurological: Negative for focal weakness, loss of consciousness and headaches.  Psychiatric/Behavioral: Positive for substance abuse.    Past Medical History:  Diagnosis Date  . Alcohol abuse   . Alcoholic cirrhosis ([place])   . Anemia   . Anxiety   . B12 deficiency   . CAD (coronary artery disease)    a. cath 11/10/2014 LM nl, LAD min irregs, D1 30 ost, D2 50d, LCX 2m, OM1 80 p/m (1.5 mm vessel), OM2 72m, RCA nondom 59m-->med rx.. Demand ischemia in the setting of rapid a-fib.  . Cardiomyopathy ([place])   . Carotid artery disease ([place])    a. 01/2015 Carotid Angio: RICA 427, LICA 06C. s/p L carotid endarterectomy 02/2015.  [name] Chronic combined systolic and diastolic CHF (congestive heart failure) ([place])    a. Last echo 12/205: EF 40-45%, inferoapical/posterior HK, not technically sufficient to allow eval of LV diastolic function, normal LA in size..  . Cocaine abuse ([place])   . COPD (chronic obstructive pulmonary disease) ([place])   . Critical lower limb ischemia   . Depression   . Diabetic peripheral neuropathy ([place])   .  Elevated troponin    a. Chronic elevation.  [name] GERD (gastroesophageal reflux disease)   . Hyperlipemia   . Hypertension   . Hypokalemia   . Hypomagnesemia   . Hypothyroidism   . Marijuana abuse   . Narcotic abuse ([place])   . Noncompliance   . NSVT (nonsustained ventricular tachycardia) ([place])   . Obesity   . PAF (paroxysmal atrial fibrillation) ([place])   . Paroxysmal  atrial tachycardia ([place])   . Peripheral arterial disease ([place])    a. 01/2015 Angio/PTA: LSFA 100 w/ recon @ adductor canal and 1 vessel runoff via AT, RSFA 99 (atherectomy/pta) - 1 vessel runoff via diff dzs peroneal.  . Poorly controlled type 2 diabetes mellitus ([place])   . Renal insufficiency    a. 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  [name] Symptomatic bradycardia    avoid AV blocking agent per EP  . Tobacco abuse     Past Surgical History:  Procedure Laterality Date  . AMPUTATION Right 06/14/2017   Procedure: Right foot transmetatarsal amputation;  Surgeon: Newt Minion, MD;  Location: [facility];  Service: Orthopedics;  Laterality: Right;  . AMPUTATION TOE Right 04/28/2017   Procedure: AMPUTATION OF RIGHT SECOND RAY;  Surgeon: Newt Minion, MD;  Location: [facility];  Service: Orthopedics;  Laterality: Right;  . BALLOON ANGIOPLASTY, ARTERY Right 01/15/2015   SFA/notes 01/15/2015  . CARDIAC CATHETERIZATION    . CARDIAC CATHETERIZATION N/A 01/12/2016   Procedure: Temporary Wire;  Surgeon: Minus Breeding, MD;  Location: [facility];  Service: Cardiovascular;  Laterality: N/A;  . CARDIOVERSION  ~ 02/2013   "twice"   . CAROTID ANGIOGRAM N/A 01/15/2015   Procedure: CAROTID ANGIOGRAM;  Surgeon: [name], MD;  Location: [facility] CATH LAB;  Service: Cardiovascular;  Laterality: N/A;  . DILATION AND CURETTAGE OF UTERUS  1988  . ENDARTERECTOMY Left 02/19/2015   Procedure: LEFT CAROTID ENDARTERECTOMY ;  Surgeon: Serafina Mitchell, MD;  Location: [facility];  Service: Vascular;  Laterality: Left;  . LEFT HEART CATHETERIZATION WITH CORONARY ANGIOGRAM N/A 10/31/2014   Procedure: LEFT HEART CATHETERIZATION WITH CORONARY ANGIOGRAM;  Surgeon: Burnell Blanks, MD;  Location: [facility] CATH LAB;  Service: Cardiovascular;  Laterality: N/A;  . LOWER EXTREMITY ANGIOGRAM N/A 09/10/2013   Procedure: LOWER EXTREMITY ANGIOGRAM;  Surgeon: [name], MD;  Location: [facility] CATH LAB;  Service: Cardiovascular;  Laterality: N/A;  . LOWER  EXTREMITY ANGIOGRAM N/A 01/15/2015   Procedure: LOWER EXTREMITY ANGIOGRAM;  Surgeon: [name], MD;  Location: [facility] CATH LAB;  Service: Cardiovascular;  Laterality: N/A;  . LOWER EXTREMITY ANGIOGRAPHY N/A 04/13/2017   Procedure: Lower Extremity Angiography;  Surgeon: [name], MD;  Location: [facility];  Service: Cardiovascular;  Laterality: N/A;  . PERIPHERAL ATHRECTOMY Right 01/15/2015   SFA/notes 01/15/2015  . PERIPHERAL VASCULAR INTERVENTION  04/13/2017   Procedure: Peripheral Vascular Intervention;  Surgeon: [name], MD;  Location: [facility];  Service: Cardiovascular;;     reports that she has quit smoking. Her smoking use included e-cigarettes and cigarettes. She has a 40.00 pack-year smoking history. She has quit using smokeless tobacco.  Her smokeless tobacco use included snuff. She reports that she drinks alcohol. She reports that she has current or past drug history. Drugs: "Crack" cocaine, Marijuana, and Oxycodone.  Allergies  Allergen Reactions  . Gabapentin Nausea And Vomiting and Other (See Comments)    POSSIBLE SHAKING? TAKING MED CURRENTLY  . Lyrica [Pregabalin] Other (See Comments)    Shaking  Pt still taking lyrica--"probably will stop taking"     Family History  Problem Relation Age of Onset  . Hypertension Mother   . Diabetes Mother   . Cancer Mother        breast, ovarian, colon  . Clotting disorder Mother   . Heart disease Mother   . Heart attack Mother   . Breast cancer Mother        in 86's  . Hypertension Father   . Heart disease Father   . Emphysema Sister        smoked    Prior to Admission medications   Medication Sig Start Date End Date Taking? Authorizing Provider  acetaminophen (TYLENOL) 325 MG tablet Take 650 mg by mouth every 6 (six) hours as needed for mild pain or moderate pain.    Yes [provider]  albuterol (PROVENTIL HFA;VENTOLIN HFA) 108 (90 Base) MCG/ACT inhaler Inhale 2 puffs into the lungs  every 6 (six) hours as needed for wheezing or shortness of breath. 08/09/17  Yes Medina-Vargas, Monina C, NP  amiodarone (PACERONE) 100 MG tablet TAKE 1 TABLET (100 MG TOTAL) BY MOUTH DAILY Patient taking differently: Take 100 mg by mouth daily.  09/20/18  Yes Larey Dresser, MD  amLODipine (NORVASC) 10 MG tablet Take 1 tablet (10 mg total) by mouth daily. 09/26/18  Yes Larey Dresser, MD  apixaban (ELIQUIS) 5 MG TABS tablet Take 1 tablet (5 mg total) by mouth 2 (two) times daily. 08/09/17  Yes Medina-Vargas, Monina C, NP  atorvastatin (LIPITOR) 40 MG tablet TAKE 1 TABLET BY MOUTH ONCE EVERY EVENING Patient taking differently: Take 40 mg by mouth daily. TAKE 1 TABLET BY MOUTH ONCE EVERY EVENING 08/09/17  Yes Medina-Vargas, Monina C, NP  bisoprolol (ZEBETA) 10 MG tablet Take 1 tablet (10 mg total) by mouth daily. 09/03/18 09/03/19 Yes Clegg, [name] D, NP  cholecalciferol (VITAMIN D) 1000 units tablet Take 1,000 Units by mouth daily.   Yes [provider]  clopidogrel (PLAVIX) 75 MG tablet Take 1 tablet (75 mg total) by mouth daily with breakfast. 08/09/17  Yes Medina-Vargas, Monina C, NP  insulin aspart (NOVOLOG) 100 UNIT/ML injection Inject 30 Units into the skin 3 (three) times daily before meals. 08/09/17  Yes Medina-Vargas, Monina C, NP  Insulin Glargine (LANTUS SOLOSTAR) 100 UNIT/ML Solostar Pen Inject 72 Units into the skin at bedtime.   Yes [provider]  levothyroxine (SYNTHROID, LEVOTHROID) 50 MCG tablet Take 1 tablet (50 mcg total) by mouth daily. 08/09/17  Yes Medina-Vargas, Monina C, NP  losartan (COZAAR) 100 MG tablet Take 1 tablet (100 mg total) by mouth daily. 10/18/18  Yes Larey Dresser, MD  metolazone (ZAROXOLYN) 2.5 MG tablet Take 2.5 mg by mouth daily. By HF clinic    Yes [provider]  torsemide (DEMADEX) 20 MG tablet Take 40 mg by mouth daily.   Yes [provider]  traZODone (DESYREL) 100 MG tablet Take 100 mg by mouth at bedtime as needed for  sleep.   Yes [provider]  DULoxetine (CYMBALTA) 60 MG capsule Take 1 capsule (60 mg total) by mouth daily. Patient not taking: Reported on 10/21/2018 08/09/17   Medina-Vargas, Monina C, NP  nicotine (NICODERM CQ) 21 mg/24hr patch Place 1 patch (21 mg total) onto the skin daily. Patient not taking: Reported on 10/18/2018 10/18/18   Larey Dresser, MD    Physical Exam:  Constitutional: Obese female appears to be ill and respiratory distress Vitals:  10/21/18 0115 10/21/18 0130 10/21/18 0145  BP: 121/70 130/66 (!) 146/75  Pulse: 93 93 91  Resp: (!) 28 (!) 21 (!) 30  SpO2: 97% 91% 90%   Eyes: PERRL, lids and conjunctivae normal ENMT: Mucous membranes are moist. Posterior pharynx clear of any exudate or lesions. Poor dentition.  Neck: normal, supple, no masses, no thyromegaly  Respiratory: Tachypneic with expiratory wheezes and rhonchi appreciated.  Patient on 2 L nasal cannula oxygen Cardiovascular: Irregularly irregular, no murmurs / rubs / gallops.  +1 pitting lower extremity edema. 1+ pedal pulses. No carotid bruits.  Abdomen: no tenderness, no masses palpated. No hepatosplenomegaly. Bowel sounds positive.  Musculoskeletal: no clubbing / cyanosis.  Has metatarsal amputation of the right foot Skin: Abrasions to the bilateral lower extremities with erythema present Neurologic: CN 2-12 grossly intact.  Strength 5/5 in all 4.  Psychiatric: Normal judgment and insight. Alert and oriented x 3. Normal mood.     Labs on Admission: I have personally reviewed following labs and imaging studies  CBC: Recent Labs  Lab 10/21/18 0125  WBC 16.9*  NEUTROABS 14.1*  HGB 9.6*  HCT 32.8*  MCV 97.6  PLT 263   Basic Metabolic Panel: Recent Labs  Lab 10/18/18 1109 10/21/18 0117  NA 143 138  K 3.8 4.1  CL 110 104  CO2 23 22  GLUCOSE 104* 353*  BUN 19 23*  CREATININE 1.33* 1.07*  CALCIUM 8.8* 8.8*   GFR: Estimated Creatinine Clearance: 72.7 mL/min (A) (by C-G formula based  on SCr of 1.07 mg/dL (H)). Liver Function Tests: No results for input(s): AST, ALT, ALKPHOS, BILITOT, PROT, ALBUMIN in the last 168 hours. No results for input(s): LIPASE, AMYLASE in the last 168 hours. No results for input(s): AMMONIA in the last 168 hours. Coagulation Profile: No results for input(s): INR, PROTIME in the last 168 hours. Cardiac Enzymes: No results for input(s): CKTOTAL, CKMB, CKMBINDEX, TROPONINI in the last 168 hours. BNP (last 3 results) No results for input(s): PROBNP in the last 8760 hours. HbA1C: No results for input(s): HGBA1C in the last 72 hours. CBG: No results for input(s): GLUCAP in the last 168 hours. Lipid Profile: No results for input(s): CHOL, HDL, LDLCALC, TRIG, CHOLHDL, LDLDIRECT in the last 72 hours. Thyroid Function Tests: No results for input(s): TSH, T4TOTAL, FREET4, T3FREE, THYROIDAB in the last 72 hours. Anemia Panel: No results for input(s): VITAMINB12, FOLATE, FERRITIN, TIBC, IRON, RETICCTPCT in the last 72 hours. Urine analysis:    Component Value Date/Time   COLORURINE YELLOW 08/18/2018 [place] 08/18/2018 0519   LABSPEC 1.011 08/18/2018 0519   PHURINE 5.0 08/18/2018 0519   GLUCOSEU 50 (A) 08/18/2018 0519   HGBUR SMALL (A) 08/18/2018 0519   BILIRUBINUR NEGATIVE 08/18/2018 [place] 08/18/2018 0519   PROTEINUR NEGATIVE 08/18/2018 0519   UROBILINOGEN 0.2 09/05/2015 0012   NITRITE NEGATIVE 08/18/2018 0519   LEUKOCYTESUR NEGATIVE 08/18/2018 0519   Sepsis Labs: No results found for this or any previous visit (from the past 240 hour(s)).   Radiological Exams on Admission: Dg Chest 2 View  Result Date: 10/21/2018 CLINICAL DATA:  57 year old female with shortness of breath. EXAM: CHEST - 2 VIEW COMPARISON:  Chest radiograph dated 08/18/2018 FINDINGS: Cardiomegaly with mild vascular congestion. No focal consolidation or pneumothorax. Small bilateral pleural effusions. No acute osseous pathology.  IMPRESSION: Cardiomegaly with mild vascular congestion and small bilateral pleural effusions. Electronically Signed   By: Anner Crete M.D.   On: 10/21/2018 02:11  EKG: Independently reviewed.  Atrial fibrillation 96 bpm  Assessment/Plan Acute respiratory failure with hypoxia, COPD and/or CHF exacerbation: Patient presents with complaints of shortness of breath found to be acutely wheezing with rhonchi present.  Patient requiring 2 L nasal cannula oxygen to maintain O2 saturations. - Admit to a telemetry bed - Continuous pulse oximetry with nasal cannula oxygen as needed to keep O2 saturations >92% - Check urine drug screen - DuoNebs 4 times daily and as needed  - Deferred steroids given hyperglycemia - mucinex  Combined diastolic and systolic congestive heart failure: Patient complains of lower extremity and orthopnea.  Last echocardiogram 11/2016 with EF of 40 to 45% with grade 2 diastolic dysfunction.  BNP elevated at 548.1.  Chest x-ray showing cardiomegaly with mild vascular congestion and small bilateral pleural effusions.  Patient was given 80 mg of Lasix IV and she takes furosemide 40 mg at home.  - Heart failure orders set  initiated  - Strict I&Os and daily weights - Elevate lower extremities - Reassess urine output and give additional IV Lasix as needed - Check echocardiogram - Optimize medical management as able - Will need to consult cardiology in a.m.   SIRS: Patient presents tachypneic with white blood cell count elevated at 16.9.  - Check lactic acid, procalcitonin, CRP - Check blood and sputum cultures - Given recent hospitalization empiric antibiotics of vancomycin and cefepime  Suspected cellulitis of the bilateral lower extremities: Acute.  Patient notes increasing redness after sustaining scratches from her dogs.  Increased warmth on physical exam. - Elevate lower extremities  Anemia: Acute on chronic.  Patient previously noted to have hemoglobin of 11.9 just  2 months ago,  but presents with hemoglobin down to 9.6.  Denies any reports of bleeding. - Check stool guaiac - Check H&H  Diabetes mellitus type 2, uncontrolled.  Last hemoglobin A1c on file noted to be 9.8.  Patient reports blood sugars running in the 200-300s.  Initial blood glucose elevated at 353. - Check hemoglobin A1c - Hypoglycemic protocol - Continue home regimen of insulin - CBGs q. before meals with additional moderate sliding scale insulin    Paroxysmal atrial fibrillation: Patient currently rate controlled with medications.  Patient reports taking Eliquis as advised - Continue amiodarone and Eliquis  CAD, Peripheral vascular disease - Initially held Plavix due to drop in hemoglobin - Continues statin  Essential hypertension - Continue bisoprolol, losartan, amlodipine - Held metolazone and torsemide  Hyperlipidemia - Continue atorvastatin  Hypothyroidism: Last TSH noted to be 3.019 in in May of this year. - Add-on TSH - Continue levothyroxine  Alcohol abuse: Patient reports only intermittent use of alcohol. - CIWA protocol without scheduled Ativan  Tobacco abuse: Patient admits to smoking 1 pack cigarettes per day on average.   - Counseled on the need of cessation of tobacco - Nicotine patch offered  Morbid obesity: BMI 42.23  DVT prophylaxis: SCDs Code Status: Full Family Communication: No family present at bedside Disposition Plan: To be determined Consults called: none Admission status: Inpatient  Norval Morton MD [facility] Pager [phone]   If 7PM-7AM, please contact night-coverage [email or web address] Password [email or web address]  10/21/2018, 4:10 AM

## 2018-10-21 NOTE — Progress Notes (Signed)
PROGRESS NOTE    [name]  PYK:[id number] DOB: [birth year] DOA: 10/21/2018 PCP: Sandi Mariscal, MD    Brief Narrative:  57 y.o. female with medical history significant of COPD, combined diastolic and systolic CHF, PAF, DM type II, PVD, s/p right foot transmetatarsal amputation, tobacco abuse, alcohol abuse, hypothyroidism; who presents with complaints of progressively worsening shortness of breath over the last 1 week.   Complaints of worsening lower extremity swelling, orthopnea, minimally productive cough, intermittent chills, and nausea.  Unsure if her weight is up or down.  Tried utilizing breathing treatments at home without relief of symptoms.  Denies having any fever, vomiting, diarrhea, blood in stool, or recent sick contacts.  She notes scratches and wounds of her legs that she sustained from her dogs.  Notes that they have become more red in appearance.  She also admits to drinking alcohol intermittently, but reports that she does not drink on daily basis.  She still smokes about 1 pack of cigarettes per day on average and normally does require oxygen.   Her blood sugars at home have been running on average in the 200- 300s.  ED Course: Upon admission to the emergency department no initial temperature was obtained.  Patient was noted to be tachypneic up into the 30s and O2 saturations maintained on 2 L of nasal cannula oxygen.  Labs revealed WBC 16.9, hemoglobin 9.6, BUN 23, creatinine 1.07, glucose 353, troponin 0, and BNP 548.  Patient was given 80 mg of Lasix IV and albuterol breathing treatment.  TRH called to admit for acute respiratory failure from suspected COPD and heart failure exacerbation  Assessment & Plan:   Principal Problem:   Acute respiratory failure ([name]) Active Problems:   Alcohol abuse   DM (diabetes mellitus), type 2 with peripheral vascular complications ([name])   Cellulitis   Coronary artery disease   Chronic systolic CHF (congestive heart failure) ([name])   SIRS  (systemic inflammatory response syndrome) ([name])  Acute respiratory failure with hypoxia, COPD and/or CHF exacerbation: Patient presents with complaints of shortness of breath found to be acutely wheezing with rhonchi present.  Patient requiring 2 L nasal cannula oxygen to maintain O2 saturations. - Admit to a telemetry bed - Continuous pulse oximetry with nasal cannula oxygen as needed to keep O2 saturations >92% - Continue scheduled nebs and steroids -still with audible inspiratory and expiratory wheezing  Combined diastolic and systolic congestive heart failure: Patient complains of lower extremity and orthopnea.  Last echocardiogram 11/2016 with EF of 40 to 45% with grade 2 diastolic dysfunction.  BNP elevated at 548.1.  Chest x-ray showing cardiomegaly with mild vascular congestion and small bilateral pleural effusions.  - Continue on IV lasix scheduled -2d echo ordered , pending  Leukocytosis without sepsis, ruled out -procalcitonin <.0.1 -follow cbc -d/c IV abx for now but will continue empiric zpak given acute hypoxia per above  Cellulitis of the bilateral lower extremities: Acute.  Patient notes increasing redness after sustaining scratches from her dogs.  Increased warmth on physical exam. - Elevate lower extremities -Give course of doxy per above  Anemia: Acute on chronic.  Patient previously noted to have hemoglobin of 11.9 just 2 months ago,  but presents with hemoglobin down to 9.6.  Denies any reports of bleeding. - Repeat cbc in AM  Diabetes mellitus type 2, uncontrolled.  Last hemoglobin A1c on file noted to be 9.8.  Patient reports blood sugars running in the 200-300s.  Initial blood glucose elevated at 353. - a1c  of 7.1 - Hypoglycemic protocol - Continue home regimen of insulin, titrate as needed - CBGs q. before meals with additional moderate sliding scale insulin    Paroxysmal atrial fibrillation: Patient currently rate controlled with medications.  Patient  reports taking Eliquis as advised - Continue amiodarone and Eliquis -Rate controlled at present  CAD, Peripheral vascular disease - Initially held Plavix due to drop in hemoglobin - Continues statin -no chest pain at present  Essential hypertension - Continue bisoprolol, losartan, amlodipine - Held metolazone and torsemide -on IV diuretic per above  Hyperlipidemia - Continue atorvastatin -Stable at this time  Hypothyroidism: Last TSH noted to be 3.019 in in May of this year. -TSH reviewed, normal - Continue levothyroxine  Alcohol abuse: Patient reports only intermittent use of alcohol. - CIWA ordered, will continue  Tobacco abuse: Patient admits to smoking 1 pack cigarettes per day on average.   - Cessation done face to face. Pt states having quit before - Nicotine patch offered   DVT prophylaxis: SCD's Code Status: Full Family Communication: Pt in room, family not at bedside Disposition Plan: Uncertain at this time  Consultants:     Procedures:     Antimicrobials: Anti-infectives (From admission, onward)   Start     Dose/Rate Route Frequency Ordered Stop   10/21/18 2000  vancomycin (VANCOCIN) 1,250 mg in sodium chloride 0.9 % 250 mL IVPB     1,250 mg 166.7 mL/hr over 90 Minutes Intravenous Every 12 hours 10/21/18 0537     10/21/18 1400  ceFEPIme (MAXIPIME) 1 g in sodium chloride 0.9 % 100 mL IVPB     1 g 200 mL/hr over 30 Minutes Intravenous Every 8 hours 10/21/18 0538     10/21/18 0530  ceFEPIme (MAXIPIME) 2 g in sodium chloride 0.9 % 100 mL IVPB     2 g 200 mL/hr over 30 Minutes Intravenous  Once 10/21/18 0527 10/21/18 0626   10/21/18 0530  vancomycin (VANCOCIN) 2,000 mg in sodium chloride 0.9 % 500 mL IVPB     2,000 mg 250 mL/hr over 120 Minutes Intravenous  Once 10/21/18 0527 10/21/18 1225       Subjective: Still sob and with wheezing  Objective: Vitals:   10/21/18 1430 10/21/18 1529 10/21/18 1530 10/21/18 1606  BP: (!) 108/49  (!)  117/56 (!) 151/66  Pulse: 84 86 85 81  Resp:  (!) 22  18  SpO2: 96% 98% 95% 100%    Intake/Output Summary (Last 24 hours) at 10/21/2018 1625 Last data filed at 10/21/2018 1439 Gross per 24 hour  Intake 200 ml  Output -  Net 200 ml   There were no vitals filed for this visit.  Examination:  General exam: Appears calm and comfortable  Respiratory system: Increased resp effort, inspiratory and expiratory wheezing without need for auscultation Cardiovascular system: S1 & S2 heard, RRR. Gastrointestinal system: Abdomen is nondistended, soft and nontender. No organomegaly or masses felt. Normal bowel sounds heard. Central nervous system: Alert and oriented. No focal neurological deficits. Extremities: Symmetric 5 x 5 power. Skin: No rashes, lesions, B LE redness surrounding dog scratches Psychiatry: Judgement and insight appear normal. Mood & affect appropriate.   Data Reviewed: I have personally reviewed following labs and imaging studies  CBC: Recent Labs  Lab 10/21/18 0125  WBC 16.9*  NEUTROABS 14.1*  HGB 9.6*  HCT 32.8*  MCV 97.6  PLT 664   Basic Metabolic Panel: Recent Labs  Lab 10/18/18 1109 10/21/18 0117  NA 143 138  K 3.8  4.1  CL 110 104  CO2 23 22  GLUCOSE 104* 353*  BUN 19 23*  CREATININE 1.33* 1.07*  CALCIUM 8.8* 8.8*   GFR: Estimated Creatinine Clearance: 72.7 mL/min (A) (by C-G formula based on SCr of 1.07 mg/dL (H)). Liver Function Tests: No results for input(s): AST, ALT, ALKPHOS, BILITOT, PROT, ALBUMIN in the last 168 hours. No results for input(s): LIPASE, AMYLASE in the last 168 hours. No results for input(s): AMMONIA in the last 168 hours. Coagulation Profile: No results for input(s): INR, PROTIME in the last 168 hours. Cardiac Enzymes: No results for input(s): CKTOTAL, CKMB, CKMBINDEX, TROPONINI in the last 168 hours. BNP (last 3 results) No results for input(s): PROBNP in the last 8760 hours. HbA1C: Recent Labs    10/21/18 0533  HGBA1C  7.1*   CBG: Recent Labs  Lab 10/21/18 0715 10/21/18 1237 10/21/18 1621  GLUCAP 393* 416* 418*   Lipid Profile: No results for input(s): CHOL, HDL, LDLCALC, TRIG, CHOLHDL, LDLDIRECT in the last 72 hours. Thyroid Function Tests: Recent Labs    10/21/18 0533  TSH 1.157   Anemia Panel: No results for input(s): VITAMINB12, FOLATE, FERRITIN, TIBC, IRON, RETICCTPCT in the last 72 hours. Sepsis Labs: Recent Labs  Lab 10/21/18 0533  PROCALCITON <0.10  LATICACIDVEN 1.1    Recent Results (from the past 240 hour(s))  Culture, blood (x 2)     Status: None (Preliminary result)   Collection Time: 10/21/18  5:33 AM  Result Value Ref Range Status   Specimen Description BLOOD RIGHT ANTECUBITAL  Final   Special Requests   Final    BOTTLES DRAWN AEROBIC AND ANAEROBIC Blood Culture adequate volume   Culture   Final    NO GROWTH < 12 HOURS Performed at [facility], [address] [place], [place] [address]    Report Status PENDING  Incomplete  Culture, blood (x 2)     Status: None (Preliminary result)   Collection Time: 10/21/18  5:45 AM  Result Value Ref Range Status   Specimen Description BLOOD RIGHT HAND  Final   Special Requests   Final    BOTTLES DRAWN AEROBIC AND ANAEROBIC Blood Culture adequate volume   Culture   Final    NO GROWTH < 12 HOURS Performed at [facility], [place] [address], [place], [place] [address]    Report Status PENDING  Incomplete     Radiology Studies: Dg Chest 2 View  Result Date: 10/21/2018 CLINICAL DATA:  57 year old female with shortness of breath. EXAM: CHEST - 2 VIEW COMPARISON:  Chest radiograph dated 08/18/2018 FINDINGS: Cardiomegaly with mild vascular congestion. No focal consolidation or pneumothorax. Small bilateral pleural effusions. No acute osseous pathology. IMPRESSION: Cardiomegaly with mild vascular congestion and small bilateral pleural effusions. Electronically Signed   By: Anner Crete M.D.   On: 10/21/2018 02:11     Scheduled Meds: . amiodarone  100 mg Oral Daily  . amLODipine  10 mg Oral Daily  . atorvastatin  40 mg Oral Daily  . bisoprolol  10 mg Oral Daily  . folic acid  1 mg Oral Daily  . guaiFENesin  600 mg Oral BID  . insulin aspart  0-15 Units Subcutaneous TID WC  . insulin aspart  30 Units Subcutaneous TID AC  . insulin glargine  72 Units Subcutaneous QHS  . ipratropium-albuterol  3 mL Nebulization QID  . levothyroxine  50 mcg Oral QAC breakfast  . losartan  100 mg Oral Daily  . multivitamin with  minerals  1 tablet Oral Daily  . nicotine  21 mg Transdermal Daily  . sodium chloride flush  3 mL Intravenous Q12H  . sodium chloride flush  3 mL Intravenous Q12H  . thiamine  100 mg Oral Daily   Or  . thiamine  100 mg Intravenous Daily   Continuous Infusions: . sodium chloride    . ceFEPime (MAXIPIME) IV Stopped (10/21/18 1439)  . vancomycin       LOS: 0 days   Marylu Lund, MD [facility] Pager On Amion  If 7PM-7AM, please contact night-coverage 10/21/2018, 4:25 PM

## 2018-10-21 NOTE — ED Triage Notes (Signed)
Pt here with acute onset shortness of breath and respiratory distress.  Hx of COPD, DM, CAD, and HTN.  Felt the wheezing for the last 6-10 hours, worse over the last hour when lying down.  EMS treated with 10mg  albuterol, 0.5 Atrovent, 125 mg solumedrol, 2mg  mag, 2mg  ativan.  Pt reported nausea and EMS treated with 4mg  Zofran. Lungs still remain diminished and having wheezing throughout.

## 2018-10-21 NOTE — ED Provider Notes (Signed)
[facility] EMERGENCY DEPARTMENT Provider Note   CSN: [id number] Arrival date & time: 10/21/18  0111     History   Chief Complaint Chief Complaint  Patient presents with  . Respiratory Distress    HPI [name] is a 57 y.o. female.  The history is provided by the patient.  She has history of hypertension, diabetes, hyperlipidemia, coronary artery disease, systolic heart failure, paroxysmal atrial fibrillation anticoagulated on apixaban, COPD and comes in with progressively worsening dyspnea over the last week, significantly worse since yesterday.  Shortness of breath is worse when she lays flat.  There is a mild cough productive of small amount of clear sputum.  She denies fever or chills but has had sweats.  There is been no nausea or vomiting.  She has not done anything to treat her symptoms.  She denies any dietary indiscretions.  She has noticed that her chronic leg edema has gotten worse.  Past Medical History:  Diagnosis Date  . Alcohol abuse   . Alcoholic cirrhosis ([place])   . Anemia   . Anxiety   . B12 deficiency   . CAD (coronary artery disease)    a. cath 11/10/2014 LM nl, LAD min irregs, D1 30 ost, D2 50d, LCX 15m, OM1 80 p/m (1.5 mm vessel), OM2 83m, RCA nondom 34m-->med rx.. Demand ischemia in the setting of rapid a-fib.  . Cardiomyopathy ([place])   . Carotid artery disease ([place])    a. 01/2015 Carotid Angio: RICA 527, LICA 78E. s/p L carotid endarterectomy 02/2015.  [name] Chronic combined systolic and diastolic CHF (congestive heart failure) ([place])    a. Last echo 12/205: EF 40-45%, inferoapical/posterior HK, not technically sufficient to allow eval of LV diastolic function, normal LA in size..  . Cocaine abuse ([place])   . COPD (chronic obstructive pulmonary disease) ([place])   . Critical lower limb ischemia   . Depression   . Diabetic peripheral neuropathy ([place])   . Elevated troponin    a. Chronic elevation.  [name] GERD (gastroesophageal reflux disease)   .  Hyperlipemia   . Hypertension   . Hypokalemia   . Hypomagnesemia   . Hypothyroidism   . Marijuana abuse   . Narcotic abuse ([place])   . Noncompliance   . NSVT (nonsustained ventricular tachycardia) ([place])   . Obesity   . PAF (paroxysmal atrial fibrillation) ([place])   . Paroxysmal atrial tachycardia ([place])   . Peripheral arterial disease ([place])    a. 01/2015 Angio/PTA: LSFA 100 w/ recon @ adductor canal and 1 vessel runoff via AT, RSFA 99 (atherectomy/pta) - 1 vessel runoff via diff dzs peroneal.  . Poorly controlled type 2 diabetes mellitus ([place])   . Renal insufficiency    a. 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  [name] Symptomatic bradycardia    avoid AV blocking agent per EP  . Tobacco abuse     Patient Active Problem List   Diagnosis Date Noted  . AKI (acute kidney injury) ([place]) 08/18/2018  . Coagulation disorder ([place]) 08/09/2017  . Depression 07/21/2017  . At risk for adverse drug reaction 06/20/2017  . Peripheral neuropathy 06/20/2017  . Acute osteomyelitis of right foot ([place]) 06/13/2017  . S/P transmetatarsal amputation of foot, right ([place]) 06/05/2017  . 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 PAD (peripheral artery disease) ([place])   . Sepsis due to other etiology ([place]) 04/06/2017  . CKD (chronic kidney disease), stage III ([place]) 11/24/2016  . Suspected sleep  apnea 11/24/2016  . Severe obesity (BMI >= 40) ([place]) 02/24/2016  . COPD GOLD 0  02/24/2016  . Chronic systolic CHF (congestive heart failure) ([place]) 02/12/2016  . Encounter for therapeutic drug monitoring 02/10/2016  . Symptomatic bradycardia 01/12/2016  . Essential hypertension 12/22/2015  . Wheeze   . 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 Tobacco abuse 10/23/2015  . Coronary artery disease   . DOE (dyspnea on exertion) 04/29/2015  . PAF (paroxysmal atrial fibrillation) ([place]) 01/16/2015  . Carotid arterial disease ([place]) 01/16/2015  . Claudication ([place]) 01/15/2015    . Demand ischemia ([place]) 10/29/2014  . Insomnia 02/03/2014  . S/P peripheral artery angioplasty - [place] atherectomy; R SFA 09/11/2013    Class: Acute  . Leg pain, bilateral 08/19/2013  . Hypothyroidism 07/31/2013  . Cellulitis 06/13/2013  . History of cocaine abuse ([place]) 06/13/2013  . Long term current use of anticoagulant therapy 05/20/2013  . Alcohol abuse   . Narcotic abuse ([place])   . Marijuana abuse   . Alcoholic cirrhosis ([place])   . DM (diabetes mellitus), type 2 with peripheral vascular complications [facility])     Past Surgical History:  Procedure Laterality Date  . AMPUTATION Right 06/14/2017   Procedure: Right foot transmetatarsal amputation;  Surgeon: Newt Minion, MD;  Location: [facility];  Service: Orthopedics;  Laterality: Right;  . AMPUTATION TOE Right 04/28/2017   Procedure: AMPUTATION OF RIGHT SECOND RAY;  Surgeon: Newt Minion, MD;  Location: [facility];  Service: Orthopedics;  Laterality: Right;  . BALLOON ANGIOPLASTY, ARTERY Right 01/15/2015   SFA/notes 01/15/2015  . CARDIAC CATHETERIZATION    . CARDIAC CATHETERIZATION N/A 01/12/2016   Procedure: Temporary Wire;  Surgeon: Minus Breeding, MD;  Location: [facility] CV LAB;  Service: Cardiovascular;  Laterality: N/A;  . CARDIOVERSION  ~ 02/2013   "twice"   . CAROTID ANGIOGRAM N/A 01/15/2015   Procedure: CAROTID ANGIOGRAM;  Surgeon: [name], MD;  Location: [facility] CATH LAB;  Service: Cardiovascular;  Laterality: N/A;  . DILATION AND CURETTAGE OF UTERUS  1988  . ENDARTERECTOMY Left 02/19/2015   Procedure: LEFT CAROTID ENDARTERECTOMY ;  Surgeon: Serafina Mitchell, MD;  Location: [facility];  Service: Vascular;  Laterality: Left;  . LEFT HEART CATHETERIZATION WITH CORONARY ANGIOGRAM N/A 10/31/2014   Procedure: LEFT HEART CATHETERIZATION WITH CORONARY ANGIOGRAM;  Surgeon: Burnell Blanks, MD;  Location: [facility] CATH LAB;  Service: Cardiovascular;  Laterality: N/A;  . LOWER EXTREMITY ANGIOGRAM N/A 09/10/2013   Procedure: LOWER EXTREMITY  ANGIOGRAM;  Surgeon: [name], MD;  Location: [facility] CATH LAB;  Service: Cardiovascular;  Laterality: N/A;  . LOWER EXTREMITY ANGIOGRAM N/A 01/15/2015   Procedure: LOWER EXTREMITY ANGIOGRAM;  Surgeon: [name], MD;  Location: [facility] CATH LAB;  Service: Cardiovascular;  Laterality: N/A;  . LOWER EXTREMITY ANGIOGRAPHY N/A 04/13/2017   Procedure: Lower Extremity Angiography;  Surgeon: [name], MD;  Location: [facility] CV LAB;  Service: Cardiovascular;  Laterality: N/A;  . PERIPHERAL ATHRECTOMY Right 01/15/2015   SFA/notes 01/15/2015  . PERIPHERAL VASCULAR INTERVENTION  04/13/2017   Procedure: Peripheral Vascular Intervention;  Surgeon: [name], MD;  Location: [facility] CV LAB;  Service: Cardiovascular;;     OB History    Gravida  1   Para  1   Term  1   Preterm      AB      Living  1     SAB      TAB  Ectopic      Multiple      Live Births               Home Medications    Prior to Admission medications   Medication Sig Start Date End Date Taking? Authorizing Provider  acetaminophen (TYLENOL) 325 MG tablet Take 650 mg by mouth every 6 (six) hours as needed for mild pain or moderate pain.     [provider]  albuterol (PROVENTIL HFA;VENTOLIN HFA) 108 (90 Base) MCG/ACT inhaler Inhale 2 puffs into the lungs every 6 (six) hours as needed for wheezing or shortness of breath. 08/09/17   Medina-Vargas, Monina C, NP  amiodarone (PACERONE) 100 MG tablet TAKE 1 TABLET (100 MG TOTAL) BY MOUTH DAILY 09/20/18   Larey Dresser, MD  amLODipine (NORVASC) 10 MG tablet Take 1 tablet (10 mg total) by mouth daily. 09/26/18   Larey Dresser, MD  apixaban (ELIQUIS) 5 MG TABS tablet Take 1 tablet (5 mg total) by mouth 2 (two) times daily. 08/09/17   Medina-Vargas, Monina C, NP  atorvastatin (LIPITOR) 40 MG tablet TAKE 1 TABLET BY MOUTH ONCE EVERY EVENING 08/09/17   Medina-Vargas, Monina C, NP  bisoprolol (ZEBETA) 10 MG tablet Take 1 tablet (10 mg total)  by mouth daily. 09/03/18 09/03/19  Clegg, [name] D, NP  cholecalciferol (VITAMIN D) 1000 units tablet Take 1,000 Units by mouth daily.    [provider]  clopidogrel (PLAVIX) 75 MG tablet Take 1 tablet (75 mg total) by mouth daily with breakfast. 08/09/17   Medina-Vargas, Monina C, NP  DULoxetine (CYMBALTA) 30 MG capsule Take 30 mg by mouth daily. Take with 60 mg for total of 90 mg daily    [provider]  DULoxetine (CYMBALTA) 60 MG capsule Take 1 capsule (60 mg total) by mouth daily. 08/09/17   Medina-Vargas, Monina C, NP  insulin aspart (NOVOLOG) 100 UNIT/ML injection Inject 30 Units into the skin 3 (three) times daily before meals. 08/09/17   Medina-Vargas, Monina C, NP  Insulin Glargine (LANTUS SOLOSTAR) 100 UNIT/ML Solostar Pen Inject 72 Units into the skin at bedtime.    [provider]  levothyroxine (SYNTHROID, LEVOTHROID) 50 MCG tablet Take 1 tablet (50 mcg total) by mouth daily. 08/09/17   Medina-Vargas, Monina C, NP  losartan (COZAAR) 100 MG tablet Take 1 tablet (100 mg total) by mouth daily. 10/18/18   Larey Dresser, MD  metolazone (ZAROXOLYN) 2.5 MG tablet Take 2.5 mg by mouth as directed. By HF clinic    [provider]  nicotine (NICODERM CQ) 21 mg/24hr patch Place 1 patch (21 mg total) onto the skin daily. Patient not taking: Reported on 10/18/2018 10/18/18   Larey Dresser, MD  torsemide (DEMADEX) 20 MG tablet Take 40 mg by mouth daily.    [provider]  traZODone (DESYREL) 100 MG tablet Take 100 mg by mouth at bedtime as needed for sleep.    [provider]    Family History Family History  Problem Relation Age of Onset  . Hypertension Mother   . Diabetes Mother   . Cancer Mother        breast, ovarian, colon  . Clotting disorder Mother   . Heart disease Mother   . Heart attack Mother   . Breast cancer Mother        in 68's  . Hypertension Father   . Heart disease Father   . Emphysema Sister        smoked  Social History Social History   Tobacco Use  . Smoking status: Former Smoker    Packs/day: 1.00    Years: 40.00    Pack years: 40.00    Types: E-cigarettes, Cigarettes  . Smokeless tobacco: Former Systems developer    Types: Snuff  . Tobacco comment: Patient denied need  Substance Use Topics  . Alcohol use: Yes    Alcohol/week: 0.0 standard drinks  . Drug use: Yes    Types: "Crack" cocaine, Marijuana, Oxycodone    Comment: 04/29/2015 "last drug use was ~ 09/08/2013"     Allergies   Gabapentin and Lyrica [pregabalin]   Review of Systems Review of Systems  All other systems reviewed and are negative.    Physical Exam Updated Vital Signs BP 121/70   Pulse 93   Resp (!) 28   LMP 11/27/2012   SpO2 97%   Physical Exam  Nursing note and vitals reviewed.  57 year old female, resting comfortably and in no acute distress. Vital signs are significant for elevated respiratory rate. Oxygen saturation is 97%, which is normal. Head is normocephalic and atraumatic. PERRLA, EOMI. Oropharynx is clear. Neck is nontender and supple without adenopathy or JVD. Back is nontender and there is no CVA tenderness. Lungs have bibasilar rales and coarse expiratory wheezes throughout both lung fields. Chest is nontender. Heart has regular rate and rhythm without murmur. Abdomen is soft, flat, nontender without masses or hepatosplenomegaly and peristalsis is normoactive. Extremities have 2+ edema, full range of motion is present.Right mid-foot amputation. Skin is warm and dry without rash. Neurologic: Mental status is normal, cranial nerves are intact, there are no motor or sensory deficits.  ED Treatments / Results  Labs (all labs ordered are listed, but only abnormal results are displayed) Labs Reviewed  BASIC METABOLIC PANEL - Abnormal; Notable for the following components:      Result Value   Glucose, Bld 353 (*)    BUN 23 (*)    Creatinine, Ser 1.07 (*)    Calcium 8.8 (*)    GFR calc  non Af Amer 58 (*)    All other components within normal limits  CBC WITH DIFFERENTIAL/PLATELET - Abnormal; Notable for the following components:   WBC 16.9 (*)    RBC 3.36 (*)    Hemoglobin 9.6 (*)    HCT 32.8 (*)    MCHC 29.3 (*)    RDW 15.7 (*)    Neutro Abs 14.1 (*)    Abs Immature Granulocytes 0.12 (*)    All other components within normal limits  BRAIN NATRIURETIC PEPTIDE - Abnormal; Notable for the following components:   B Natriuretic Peptide 548.1 (*)    All other components within normal limits  I-STAT TROPONIN, ED    EKG EKG Interpretation  Date/Time:  Sunday October 21 2018 01:13:28 EST Ventricular Rate:  96 PR Interval:    QRS Duration: 99 QT Interval:  376 QTC Calculation: 476 R Axis:   40 Text Interpretation:  Atrial fibrillation Low voltage, precordial leads Minimal ST depression Artifact in lead(s) I III aVL When compared with ECG of 05/01/2018, No significant change was found Confirmed by Delora Fuel ([id number]) on 10/21/2018 1:18:10 AM   Radiology Dg Chest 2 View  Result Date: 10/21/2018 CLINICAL DATA:  57 year old female with shortness of breath. EXAM: CHEST - 2 VIEW COMPARISON:  Chest radiograph dated 08/18/2018 FINDINGS: Cardiomegaly with mild vascular congestion. No focal consolidation or pneumothorax. Small bilateral pleural effusions. No acute osseous pathology. IMPRESSION: Cardiomegaly with mild vascular  congestion and small bilateral pleural effusions. Electronically Signed   By: Anner Crete M.D.   On: 10/21/2018 02:11    Procedures Procedures  Medications Ordered in ED Medications - No data to display   Initial Impression / Assessment and Plan / ED Course  I have reviewed the triage vital signs and the nursing notes.  Pertinent labs & imaging results that were available during my care of the patient were reviewed by me and considered in my medical decision making (see chart for details).  Dyspnea and patient with history of both CHF and  COPD.  CHF seems to be the major component.  Will check chest x-ray and BNP.  She will be given a dose of furosemide and also an albuterol with ipratropium nebulizer treatment.  Old records are reviewed, and she does have prior hospital admission for heart failure.  Labs show significantly elevated BNP.  Also, she is noted to have had a drop in her hemoglobin over the last 2 months.  She felt much better after above-noted treatment.  However, when she was taken off of supplemental oxygen, oxygen saturation dropped significantly.  She will need to be admitted for ongoing diuresis and evaluation of her hemoglobin drop..  Case is discussed with Dr. Tamala Julian of [facility] hospitalist, who agrees to admit the patient.  Final Clinical Impressions(s) / ED Diagnoses   Final diagnoses:  Acute on chronic systolic heart failure ([name])  Normochromic normocytic anemia  Chronic obstructive pulmonary disease, unspecified COPD type [facility])    ED Discharge Orders    None       Delora Fuel, MD 94/17/40 [id number]

## 2018-10-21 NOTE — ED Notes (Addendum)
Pt CBG 393-Notified Lytle Michaels, Therapist, sports

## 2018-10-21 NOTE — ED Notes (Signed)
83% on room air.  O2 applied at 4Lpm increased saturations to 91%  Pt states no O2 at home

## 2018-10-21 NOTE — ED Notes (Signed)
Delay in lab draw,  Pt not in room 

## 2018-10-21 NOTE — Progress Notes (Signed)
Pharmacy Antibiotic Note  [name] is a 57 y.o. female admitted on 10/21/2018 with SOB/COPD, possible sepsis.  Pharmacy has been consulted for Vancomycin and Cefepime  dosing.  Plan: Vancomycin 2 g IV now, then Vancomycin 1250 mg IV q12h for est AUC of 532  Cefepime 2 g now as ordered, then 1 g IV q8h     No data recorded.  Recent Labs  Lab 10/18/18 1109 10/21/18 0117 10/21/18 0125  WBC  --   --  16.9*  CREATININE 1.33* 1.07*  --     Estimated Creatinine Clearance: 72.7 mL/min (A) (by C-G formula based on SCr of 1.07 mg/dL (H)).    Allergies  Allergen Reactions  . Gabapentin Nausea And Vomiting and Other (See Comments)    POSSIBLE SHAKING? TAKING MED CURRENTLY  . Lyrica [Pregabalin] Other (See Comments)    Shaking Pt still taking lyrica--"probably will stop taking"     Caryl Pina 10/21/2018 5:31 AM

## 2018-10-22 ENCOUNTER — Encounter: Payer: Medicare HMO | Admitting: Podiatry

## 2018-10-22 ENCOUNTER — Encounter (HOSPITAL_COMMUNITY): Payer: Self-pay | Admitting: General Practice

## 2018-10-22 ENCOUNTER — Inpatient Hospital Stay (HOSPITAL_COMMUNITY): Payer: Medicare HMO

## 2018-10-22 DIAGNOSIS — I340 Nonrheumatic mitral (valve) insufficiency: Secondary | ICD-10-CM

## 2018-10-22 DIAGNOSIS — I5022 Chronic systolic (congestive) heart failure: Secondary | ICD-10-CM

## 2018-10-22 DIAGNOSIS — I361 Nonrheumatic tricuspid (valve) insufficiency: Secondary | ICD-10-CM

## 2018-10-22 LAB — CBC
HCT: 30.6 % — ABNORMAL LOW (ref 36.0–46.0)
Hemoglobin: 8.7 g/dL — ABNORMAL LOW (ref 12.0–15.0)
MCH: 27.6 pg (ref 26.0–34.0)
MCHC: 28.4 g/dL — ABNORMAL LOW (ref 30.0–36.0)
MCV: 97.1 fL (ref 80.0–100.0)
Platelets: 338 10*3/uL (ref 150–400)
RBC: 3.15 MIL/uL — ABNORMAL LOW (ref 3.87–5.11)
RDW: 15.9 % — ABNORMAL HIGH (ref 11.5–15.5)
WBC: 18.6 10*3/uL — ABNORMAL HIGH (ref 4.0–10.5)
nRBC: 0 % (ref 0.0–0.2)

## 2018-10-22 LAB — BASIC METABOLIC PANEL
Anion gap: 10 (ref 5–15)
BUN: 41 mg/dL — ABNORMAL HIGH (ref 6–20)
CO2: 28 mmol/L (ref 22–32)
Calcium: 9.3 mg/dL (ref 8.9–10.3)
Chloride: 102 mmol/L (ref 98–111)
Creatinine, Ser: 1.42 mg/dL — ABNORMAL HIGH (ref 0.44–1.00)
GFR calc non Af Amer: 41 mL/min — ABNORMAL LOW (ref >60)
GFR, EST AFRICAN AMERICAN: 48 mL/min — AB (ref >60)
Glucose, Bld: 211 mg/dL — ABNORMAL HIGH (ref 70–99)
Potassium: 4.1 mmol/L (ref 3.5–5.1)
Sodium: 140 mmol/L (ref 135–145)

## 2018-10-22 LAB — GLUCOSE, CAPILLARY
GLUCOSE-CAPILLARY: 100 mg/dL — AB (ref 70–99)
Glucose-Capillary: 203 mg/dL — ABNORMAL HIGH (ref 70–99)
Glucose-Capillary: 249 mg/dL — ABNORMAL HIGH (ref 70–99)
Glucose-Capillary: 361 mg/dL — ABNORMAL HIGH (ref 70–99)

## 2018-10-22 LAB — ECHOCARDIOGRAM COMPLETE
Height: 64 in
Weight: 3944 oz

## 2018-10-22 MED ORDER — METHYLPREDNISOLONE SODIUM SUCC 40 MG IJ SOLR
40.0000 mg | Freq: Every day | INTRAMUSCULAR | Status: DC
  Administered 2018-10-22 – 2018-10-25 (×4): 40 mg via INTRAVENOUS
  Filled 2018-10-22 (×4): qty 1

## 2018-10-22 MED ORDER — INSULIN GLARGINE 100 UNIT/ML ~~LOC~~ SOLN
80.0000 [IU] | Freq: Every day | SUBCUTANEOUS | Status: DC
  Administered 2018-10-22: 80 [IU] via SUBCUTANEOUS
  Filled 2018-10-22 (×2): qty 0.8

## 2018-10-22 MED ORDER — IPRATROPIUM-ALBUTEROL 0.5-2.5 (3) MG/3ML IN SOLN
3.0000 mL | Freq: Three times a day (TID) | RESPIRATORY_TRACT | Status: DC
  Administered 2018-10-23 – 2018-10-25 (×7): 3 mL via RESPIRATORY_TRACT
  Filled 2018-10-22 (×7): qty 3

## 2018-10-22 NOTE — Evaluation (Signed)
Occupational Therapy Evaluation Patient Details Name: [name] MRN: [id number] DOB: 08/25/1961 Today's Date: 10/22/2018    History of Present Illness Pt is a 57 y.o. female admitted for acute respiratory failure, pt had increased SOB. CXR: cardiomegaly, vascular congesrtion and small B pleural effusions. PMH: DM, R transmetatarsal amputation, HTN, PAD, HLD, Afib on Eliquis, CHF, COPD, CKD, Peripheral neuropathy, peripheral athrectomy, left carotid endarterectomy.   Clinical Impression   Pt with decline in function and safety with ADLs and ADL mobility with decreased balance and endurance. Pt reports that she was not on O2 PTA, but now on 3L. Pt reports that she has difficulty st home with socks and shoes and ha misplaced her A/E at home. Pt would benefit from acute OT services to address impairments to maximize level of function and safety    Follow Up Recommendations  Home health OT;Supervision - Intermittent    Equipment Recommendations  Other (comment)(sock aid, LH shoe horn)    Recommendations for Other Services       Precautions / Restrictions Precautions Precautions: Fall Restrictions Weight Bearing Restrictions: No      Mobility Bed Mobility               General bed mobility comments: Pt OOB in recliner upon arrival  Transfers Overall transfer level: Needs assistance Equipment used: Rolling walker (2 wheeled) Transfers: Sit to/from Stand Sit to Stand: Min guard              Balance Overall balance assessment: Needs assistance Sitting-balance support: Feet supported;Bilateral upper extremity supported;No upper extremity supported Sitting balance-Leahy Scale: Good     Standing balance support: Bilateral upper extremity supported;During functional activity;Single extremity supported                               ADL either performed or assessed with clinical judgement   ADL Overall ADL's : Needs assistance/impaired     Grooming:  Wash/dry hands;Wash/dry face;Min guard;Standing   Upper Body Bathing: Set up;Supervision/ safety   Lower Body Bathing: Minimal assistance   Upper Body Dressing : Set up;Supervision/safety   Lower Body Dressing: Minimal assistance   Toilet Transfer: Min guard;RW   Toileting- Clothing Manipulation and Hygiene: Min guard;Sit to/from stand       Functional mobility during ADLs: Rolling walker;Min guard       Vision Baseline Vision/History: Wears glasses Wears Glasses: Reading only Patient Visual Report: No change from baseline       Perception     Praxis      Pertinent Vitals/Pain Pain Assessment: 0-10 Pain Score: 3  Pain Location: Feet, from peripheral neuropathy per pt Pain Descriptors / Indicators: Burning;Tingling Pain Intervention(s): Monitored during session;Repositioned     Hand Dominance Left   Extremity/Trunk Assessment Upper Extremity Assessment Upper Extremity Assessment: Generalized weakness   Lower Extremity Assessment Lower Extremity Assessment: Defer to PT evaluation       Communication Communication Communication: No difficulties   Cognition Arousal/Alertness: Awake/alert Behavior During Therapy: WFL for tasks assessed/performed Overall Cognitive Status: Within Functional Limits for tasks assessed                                     General Comments       Exercises     Shoulder Instructions      Home Living Family/patient expects to be discharged to:: Private  residence Living Arrangements: Other relatives(cousin) Available Help at Discharge: Family;Available PRN/intermittently Type of Home: Apartment Home Access: Stairs to enter Entrance Stairs-Number of Steps: 1 flight Entrance Stairs-Rails: Right Home Layout: One level     Bathroom Shower/Tub: Teacher, early years/pre: Standard     Home Equipment: Environmental consultant - 4 wheels;Wheelchair - Brewing technologist;Adaptive equipment Adaptive Equipment: Reacher         Prior Functioning/Environment Level of Independence: Independent with assistive device(s)  Gait / Transfers Assistance Needed: Uses rollator, short bouts of amb. Can grocery shop for self with breaks.  ADL's / Homemaking Assistance Needed: States she previously dressed and bathed herself   Comments: States she has had 3 falls in past 6 months, sister can also help PRN        OT Problem List: Decreased activity tolerance;Pain;Impaired balance (sitting and/or standing);Decreased knowledge of use of DME or AE      OT Treatment/Interventions:      OT Goals(Current goals can be found in the care plan section) Acute Rehab OT Goals Patient Stated Goal: go home OT Goal Formulation: With patient Time For Goal Achievement: 11/05/18 Potential to Achieve Goals: Good  OT Frequency: Min 2X/week   Barriers to D/C:    no barriers       Co-evaluation              AM-PAC OT "6 Clicks" Daily Activity     Outcome Measure Help from another person eating meals?: None Help from another person taking care of personal grooming?: A Little Help from another person toileting, which includes using toliet, bedpan, or urinal?: A Little Help from another person bathing (including washing, rinsing, drying)?: A Little Help from another person to put on and taking off regular upper body clothing?: None Help from another person to put on and taking off regular lower body clothing?: A Little 6 Click Score: 20   End of Session Equipment Utilized During Treatment: Gait belt;Rolling walker;Oxygen  Activity Tolerance: Patient tolerated treatment well Patient left: in chair;with call bell/phone within reach  OT Visit Diagnosis: Unsteadiness on feet (R26.81);Other abnormalities of gait and mobility (R26.89);Muscle weakness (generalized) (M62.81);Pain Pain - part of body: Ankle and joints of foot(bilaterally)                Time: 2446-9507 OT Time Calculation (min): 27 min Charges:  OT General  Charges $OT Visit: 1 Visit OT Evaluation $OT Eval Moderate Complexity: 1 Mod OT Treatments $Therapeutic Activity: 8-22 mins    Britt Bottom 10/22/2018, 2:02 PM

## 2018-10-22 NOTE — Progress Notes (Signed)
PROGRESS NOTE    [name]  FXT:[id number] DOB: 1961/07/18 DOA: 10/21/2018 PCP: Sandi Mariscal, MD    Brief Narrative:  57 y.o. female with medical history significant of COPD, combined diastolic and systolic CHF, PAF, DM type II, PVD, s/p right foot transmetatarsal amputation, tobacco abuse, alcohol abuse, hypothyroidism; who presents with complaints of progressively worsening shortness of breath over the last 1 week.   Complaints of worsening lower extremity swelling, orthopnea, minimally productive cough, intermittent chills, and nausea.  Unsure if her weight is up or down.  Tried utilizing breathing treatments at home without relief of symptoms.  Denies having any fever, vomiting, diarrhea, blood in stool, or recent sick contacts.  She notes scratches and wounds of her legs that she sustained from her dogs.  Notes that they have become more red in appearance.  She also admits to drinking alcohol intermittently, but reports that she does not drink on daily basis.  She still smokes about 1 pack of cigarettes per day on average and normally does require oxygen.   Her blood sugars at home have been running on average in the 200- 300s.  ED Course: Upon admission to the emergency department no initial temperature was obtained.  Patient was noted to be tachypneic up into the 30s and O2 saturations maintained on 2 L of nasal cannula oxygen.  Labs revealed WBC 16.9, hemoglobin 9.6, BUN 23, creatinine 1.07, glucose 353, troponin 0, and BNP 548.  Patient was given 80 mg of Lasix IV and albuterol breathing treatment.  TRH called to admit for acute respiratory failure from suspected COPD and heart failure exacerbation  Assessment & Plan:   Principal Problem:   Acute respiratory failure ([name]) Active Problems:   Alcohol abuse   DM (diabetes mellitus), type 2 with peripheral vascular complications ([name])   Cellulitis   Coronary artery disease   Chronic systolic CHF (congestive heart failure) ([name])   SIRS  (systemic inflammatory response syndrome) ([name])  Acute respiratory failure with hypoxia, COPD and/or CHF exacerbation: Patient presents with complaints of shortness of breath found to be acutely wheezing with rhonchi present.  Patient requiring 2 L nasal cannula oxygen to maintain O2 saturations. - Admit to a telemetry bed - Continuous pulse oximetry with nasal cannula oxygen as needed to keep O2 saturations >92% - Continue scheduled nebs  - start IV steroids -wheezing has improved however patient remains O2 dependent. Patient is o2 naive  Combined diastolic and systolic congestive heart failure: Patient complains of lower extremity and orthopnea.  Last echocardiogram 11/2016 with EF of 40 to 45% with grade 2 diastolic dysfunction.  BNP elevated at 548.1.  Chest x-ray showing cardiomegaly with mild vascular congestion and small bilateral pleural effusions.  - Continue on IV lasix scheduled -2d echo ordered with EF 45-50% noted -Still with B LE edema  Leukocytosis without sepsis, ruled out -procalcitonin <.0.1 -follow cbc -d/c IV abx for now but will continue empiric doxycycline  Cellulitis of the bilateral lower extremities: Acute.  Patient notes increasing redness after sustaining scratches from her dogs.  Increased warmth on physical exam. - Elevate lower extremities -Give course of doxy per above, erythema improving  Anemia: Acute on chronic.  Patient previously noted to have hemoglobin of 11.9 just 2 months ago,  but presents with hemoglobin down to 9.6.  Denies any reports of bleeding. - recheck cbc in AM  Diabetes mellitus type 2, uncontrolled.  Last hemoglobin A1c on file noted to be 9.8.  Patient reports blood sugars running  in the 200-300s.  Initial blood glucose elevated at 353. - a1c of 7.1 - Hypoglycemic protocol - Continue home regimen of insulin, titrate as needed - CBGs q. before meals with additional moderate sliding scale insulin  -Increase lantus to 80 units as pt  is to receive solumedrol per above   Paroxysmal atrial fibrillation: Patient currently rate controlled with medications.  Patient reports taking Eliquis as advised - Continue amiodarone and Eliquis - Presently rate controlled  CAD, Peripheral vascular disease - Initially held Plavix due to drop in hemoglobin - Continues statin - without chest pains  Essential hypertension - Continue bisoprolol, losartan, amlodipine - Held metolazone and torsemide - continued on IV diuretics  Hyperlipidemia - Continue atorvastatin -Stable at this time  Hypothyroidism: Last TSH noted to be 3.019 in in May of this year. -TSH reviewed, normal - Continue levothyroxine  Alcohol abuse: Patient reports only intermittent use of alcohol. - CIWA ordered - No evidence of withdrawals  Tobacco abuse: Patient admits to smoking 1 pack cigarettes per day on average.   - Cessation done face to face. Pt states having quit before - Nicotine patch offered - Stable at present  DVT prophylaxis: SCD's Code Status: Full Family Communication: Pt in room, family not at bedside Disposition Plan: Uncertain at this time  Consultants:     Procedures:     Antimicrobials: Anti-infectives (From admission, onward)   Start     Dose/Rate Route Frequency Ordered Stop   10/22/18 1000  azithromycin (ZITHROMAX) tablet 250 mg  Status:  Discontinued     250 mg Oral Daily 10/21/18 1632 10/21/18 1633   10/21/18 2200  doxycycline (VIBRA-TABS) tablet 100 mg     100 mg Oral Every 12 hours 10/21/18 1633 10/26/18 2159   10/21/18 2000  vancomycin (VANCOCIN) 1,250 mg in sodium chloride 0.9 % 250 mL IVPB  Status:  Discontinued     1,250 mg 166.7 mL/hr over 90 Minutes Intravenous Every 12 hours 10/21/18 0537 10/21/18 1632   10/21/18 1645  azithromycin (ZITHROMAX) tablet 500 mg  Status:  Discontinued     500 mg Oral Daily 10/21/18 1632 10/21/18 1633   10/21/18 1400  ceFEPIme (MAXIPIME) 1 g in sodium chloride 0.9 % 100 mL  IVPB  Status:  Discontinued     1 g 200 mL/hr over 30 Minutes Intravenous Every 8 hours 10/21/18 0538 10/21/18 1632   10/21/18 0530  ceFEPIme (MAXIPIME) 2 g in sodium chloride 0.9 % 100 mL IVPB     2 g 200 mL/hr over 30 Minutes Intravenous  Once 10/21/18 0527 10/21/18 0626   10/21/18 0530  vancomycin (VANCOCIN) 2,000 mg in sodium chloride 0.9 % 500 mL IVPB     2,000 mg 250 mL/hr over 120 Minutes Intravenous  Once 10/21/18 0527 10/21/18 1225      Subjective: States feeling better but still sob  Objective: Vitals:   10/22/18 0913 10/22/18 1001 10/22/18 1139 10/22/18 1218  BP:  139/65 (!) 136/52   Pulse:  97 (!) 59 60  Resp:  18 20 15   Temp:   98.4 F (36.9 C)   TempSrc:   Oral   SpO2: 96% 97% 96% 96%  Weight:      Height:        Intake/Output Summary (Last 24 hours) at 10/22/2018 1426 Last data filed at 10/22/2018 1200 Gross per 24 hour  Intake 1180 ml  Output 1100 ml  Net 80 ml   Filed Weights   10/21/18 1606 10/22/18 0338  Weight:  112.2 kg 111.8 kg    Examination: General exam: Awake, laying in bed, in nad Respiratory system: Mildly increased resp effort, no wheezing Cardiovascular system: regular rate, s1, s2 Gastrointestinal system: Soft, nondistended, positive BS Central nervous system: CN2-12 grossly intact, strength intact Extremities: Perfused, no clubbing, BLE edema Skin: Normal skin turgor, no notable skin lesions seen Psychiatry: Mood normal // no visual hallucinations   Data Reviewed: I have personally reviewed following labs and imaging studies  CBC: Recent Labs  Lab 10/21/18 0125 10/21/18 1621 10/22/18 0514  WBC 16.9*  --  18.6*  NEUTROABS 14.1*  --   --   HGB 9.6* 9.1* 8.7*  HCT 32.8* 31.4* 30.6*  MCV 97.6  --  97.1  PLT 331  --  409   Basic Metabolic Panel: Recent Labs  Lab 10/18/18 1109 10/21/18 0117 10/21/18 1621 10/22/18 0514  NA 143 138 132* 140  K 3.8 4.1 4.5 4.1  CL 110 104 100 102  CO2 23 22 25 28   GLUCOSE 104* 353* 476*  211*  BUN 19 23* 33* 41*  CREATININE 1.33* 1.07* 1.28* 1.42*  CALCIUM 8.8* 8.8* 8.6* 9.3   GFR: Estimated Creatinine Clearance: 54.8 mL/min (A) (by C-G formula based on SCr of 1.42 mg/dL (H)). Liver Function Tests: No results for input(s): AST, ALT, ALKPHOS, BILITOT, PROT, ALBUMIN in the last 168 hours. No results for input(s): LIPASE, AMYLASE in the last 168 hours. No results for input(s): AMMONIA in the last 168 hours. Coagulation Profile: No results for input(s): INR, PROTIME in the last 168 hours. Cardiac Enzymes: No results for input(s): CKTOTAL, CKMB, CKMBINDEX, TROPONINI in the last 168 hours. BNP (last 3 results) No results for input(s): PROBNP in the last 8760 hours. HbA1C: Recent Labs    10/21/18 0533  HGBA1C 7.1*   CBG: Recent Labs  Lab 10/21/18 1237 10/21/18 1621 10/21/18 2159 10/22/18 0726 10/22/18 1135  GLUCAP 416* 418* 240* 203* 249*   Lipid Profile: No results for input(s): CHOL, HDL, LDLCALC, TRIG, CHOLHDL, LDLDIRECT in the last 72 hours. Thyroid Function Tests: Recent Labs    10/21/18 0533  TSH 1.157   Anemia Panel: No results for input(s): VITAMINB12, FOLATE, FERRITIN, TIBC, IRON, RETICCTPCT in the last 72 hours. Sepsis Labs: Recent Labs  Lab 10/21/18 0533  PROCALCITON <0.10  LATICACIDVEN 1.1    Recent Results (from the past 240 hour(s))  Culture, blood (x 2)     Status: None (Preliminary result)   Collection Time: 10/21/18  5:33 AM  Result Value Ref Range Status   Specimen Description BLOOD RIGHT ANTECUBITAL  Final   Special Requests   Final    BOTTLES DRAWN AEROBIC AND ANAEROBIC Blood Culture adequate volume   Culture   Final    NO GROWTH < 12 HOURS Performed at [facility], [address] [place], [place] [address]    Report Status PENDING  Incomplete  Culture, blood (x 2)     Status: None (Preliminary result)   Collection Time: 10/21/18  5:45 AM  Result Value Ref Range Status   Specimen Description BLOOD RIGHT HAND   Final   Special Requests   Final    BOTTLES DRAWN AEROBIC AND ANAEROBIC Blood Culture adequate volume   Culture   Final    NO GROWTH < 12 HOURS Performed at [facility], [place] [address] [place], [place] [address]    Report Status PENDING  Incomplete     Radiology Studies: Dg Chest 2 View  Result  Date: 10/21/2018 CLINICAL DATA:  57 year old female with shortness of breath. EXAM: CHEST - 2 VIEW COMPARISON:  Chest radiograph dated 08/18/2018 FINDINGS: Cardiomegaly with mild vascular congestion. No focal consolidation or pneumothorax. Small bilateral pleural effusions. No acute osseous pathology. IMPRESSION: Cardiomegaly with mild vascular congestion and small bilateral pleural effusions. Electronically Signed   By: Anner Crete M.D.   On: 10/21/2018 02:11    Scheduled Meds: . amiodarone  100 mg Oral Daily  . amLODipine  10 mg Oral Daily  . atorvastatin  40 mg Oral Daily  . bisoprolol  10 mg Oral Daily  . doxycycline  100 mg Oral Q12H  . folic acid  1 mg Oral Daily  . furosemide  40 mg Intravenous BID  . guaiFENesin  600 mg Oral BID  . insulin aspart  0-20 Units Subcutaneous TID WC  . insulin aspart  0-5 Units Subcutaneous QHS  . insulin aspart  30 Units Subcutaneous TID AC  . insulin glargine  72 Units Subcutaneous QHS  . ipratropium-albuterol  3 mL Nebulization QID  . levothyroxine  50 mcg Oral QAC breakfast  . losartan  100 mg Oral Daily  . multivitamin with minerals  1 tablet Oral Daily  . nicotine  21 mg Transdermal Daily  . sodium chloride flush  3 mL Intravenous Q12H  . sodium chloride flush  3 mL Intravenous Q12H  . thiamine  100 mg Oral Daily   Or  . thiamine  100 mg Intravenous Daily   Continuous Infusions: . sodium chloride       LOS: 1 day   Marylu Lund, MD [facility] Pager On Amion  If 7PM-7AM, please contact night-coverage 10/22/2018, 2:26 PM

## 2018-10-22 NOTE — Progress Notes (Signed)
Inpatient Diabetes Program Recommendations  AACE/ADA: New Consensus Statement on Inpatient Glycemic Control (2015)  Target Ranges:  Prepandial:   less than 140 mg/dL      Peak postprandial:   less than 180 mg/dL (1-2 hours)      Critically ill patients:  140 - 180 mg/dL   Lab Results  Component Value Date   GLUCAP 249 (H) 10/22/2018   HGBA1C 7.1 (H) 10/21/2018    Review of Glycemic ControlResults for KAMILA, BRODA (MRN [id number]) as of 10/22/2018 12:31  Ref. Range 10/21/2018 07:15 10/21/2018 12:37 10/21/2018 16:21 10/21/2018 21:59 10/22/2018 07:26 10/22/2018 11:35  Glucose-Capillary Latest Ref Range: 70 - 99 mg/dL 393 (H) 416 (H) 418 (H) 240 (H) 203 (H) 249 (H)    Diabetes history: DM 2 Outpatient Diabetes medications:  Novolog 30 units tid with meals, Lantus 72 units q HS Current orders for Inpatient glycemic control:  Novolog resistant tid with meals and HS Novolog 30 units tid with meals Lantus 72 units q HS Inpatient Diabetes Program Recommendations:    Blood sugars improving.  May consider increasing Lantus slightly to 78 units q HS.  Will follow.  Thanks,  Adah Perl, RN, BC-ADM Inpatient Diabetes Coordinator Pager [phone] (8a-5p)

## 2018-10-22 NOTE — Progress Notes (Signed)
Blood sugar 100 tonight. Per MD Wyline Copas give scheduled 30 units Novolog anyway. Novolog given, patient already completed whole meal.  Venetia Night, RN

## 2018-10-22 NOTE — Evaluation (Signed)
Physical Therapy Evaluation Patient Details Name: [name] MRN: [id number] DOB: 10/30/1961 Today's Date: 10/22/2018   History of Present Illness  Pt is a 57 y.o. female admitted for acute respiratory failure, pt had increased SOB. CXR: cardiomegaly, vascular congesrtion and small B pleural effusions. PMH: DM, R transmetatarsal amputation, HTN, PAD, HLD, Afib on Eliquis, CHF, COPD, CKD, Peripheral neuropathy, peripheral athrectomy, left carotid endarterectomy.  Clinical Impression  Pt admitted with above diagnosis. Pt presents with peripheral neuropathy, generalized weakness and cardiopulmonary limitations affecting her ability to transfer, amb and ascend/descend stairs safely and independently. Pt amb 50 ft with rollator and one sitting rest break, dyspnea 2/4, SpO2 dropped to 82% on 3L/min [place]. Pt educated on the importance of performing short bouts of mobility during hospital stay with nursing staff. Pt needs to be able to navigate a flight of stairs to enter home. Pt has had 3 falls in the past 6 months. Pt will benefit from skilled PT to increase her independence and safety with mobility to allow discharge to home with [facility] PT.       Follow Up Recommendations Home health PT;Supervision for mobility/OOB    Equipment Recommendations  None recommended by PT    Recommendations for Other Services       Precautions / Restrictions Precautions Precautions: Fall Restrictions Weight Bearing Restrictions: No      Mobility  Bed Mobility               General bed mobility comments: pt presented in recliner chair  Transfers Overall transfer level: Needs assistance Equipment used: 4-wheeled walker(rollator) Transfers: Sit to/from Stand Sit to Stand: Min guard;Min assist         General transfer comment: Min G for first sit to stand, min A for second sit to stand after sitting rest break during amb. Pt attempted to pull on rollator to help power to stand, min A due to mild LOB.    Ambulation/Gait   Gait Distance (Feet): 50 Feet Assistive device: 4-wheeled walker(rollator) Gait Pattern/deviations: Decreased stride length;Step-through pattern Gait velocity: decreased, pt walked significantly faster for second half, may be related to drop in O2.   General Gait Details: Min guard, dyspnea 2/4, one sitting break halfway (2 min). Pt has increased ipsilateral trunk lean during stance phase. Needs cuing to check if breaks of rollator are on/off. Pt wore her shoes as she states she is safer walking with them. 3L [place] O2. SpO2 dropped to 89% after first 25 feet, 82% after 50 feet. HR 75-85 bpm. Used standard rollator with chair follow, get bari rollator for next session.  Stairs            Wheelchair Mobility    Modified Rankin (Stroke Patients Only)       Balance Overall balance assessment: Needs assistance Sitting-balance support: Feet supported;Bilateral upper extremity supported;No upper extremity supported Sitting balance-Leahy Scale: Good Sitting balance - Comments: in chair, pt donned shoes independently   Standing balance support: Bilateral upper extremity supported;During functional activity Standing balance-Leahy Scale: Poor                               Pertinent Vitals/Pain Pain Assessment: Faces Faces Pain Scale: Hurts a little bit Pain Location: Feet, from peripheral neuropathy per pt Pain Descriptors / Indicators: Other (Comment)(blistering) Pain Intervention(s): Monitored during session    Home Living Family/patient expects to be discharged to:: Private residence Living Arrangements: Other relatives(cousin) Available Help  at Discharge: Family;Available PRN/intermittently Type of Home: Apartment Home Access: Stairs to enter Entrance Stairs-Rails: Right Entrance Stairs-Number of Steps: 1 flight Home Layout: One level Home Equipment: [place] - 4 wheels;Wheelchair - Sport and exercise psychologist Comments: pt lives with cousin  in second story apt, they have been trying to get a ground level apt    Prior Function Level of Independence: Independent with assistive device(s)   Gait / Transfers Assistance Needed: Uses rollator, short bouts of amb. Can grocery shop for self with breaks.   ADL's / Homemaking Assistance Needed: States she previously dressed and bathed herself  Comments: States she has had 3 falls in past 6 months, sister can also help PRN     Hand Dominance        Extremity/Trunk Assessment   Upper Extremity Assessment Upper Extremity Assessment: Overall WFL for tasks assessed    Lower Extremity Assessment Lower Extremity Assessment: RLE deficits/detail;LLE deficits/detail RLE Deficits / Details: Decreased light touch on dorsum and plantar aspect of remaining foot s/p amputation. Quad and hamstring 4/5.  RLE Sensation: decreased light touch;history of peripheral neuropathy LLE Deficits / Details: Absent light touch on distal plantar aspect of foot. Decreased light touch on dorsal aspect of foot. Quad and hamstring 4/5 LLE Sensation: decreased light touch;history of peripheral neuropathy       Communication   Communication: No difficulties  Cognition Arousal/Alertness: Awake/alert Behavior During Therapy: WFL for tasks assessed/performed Overall Cognitive Status: Within Functional Limits for tasks assessed                                 General Comments: Oriented to self & place. Pt stated it was 2018 at first, with prompting corrected to 2019. Understands why she is in the hospital. Emergent awareness. Understands her diabetes is related to her neuropathy but not that neuropathy may be related to her falls.       General Comments      Exercises     Assessment/Plan    PT Assessment Patient needs continued PT services  PT Problem List Decreased strength;Cardiopulmonary status limiting activity;Decreased activity tolerance;Decreased knowledge of use of DME;Decreased  balance;Decreased safety awareness;Decreased mobility       PT Treatment Interventions DME instruction;Therapeutic activities;Gait training;Therapeutic exercise;Patient/family education;Stair training;Balance training;Functional mobility training;Neuromuscular re-education;Wheelchair mobility training    PT Goals (Current goals can be found in the Care Plan section)  Acute Rehab PT Goals Patient Stated Goal: ascend/descend stairs safely PT Goal Formulation: With patient Time For Goal Achievement: 11/05/18 Potential to Achieve Goals: Good    Frequency Min 3X/week   Barriers to discharge        Co-evaluation               AM-PAC PT "6 Clicks" Mobility  Outcome Measure Help needed turning from your back to your side while in a flat bed without using bedrails?: A Little Help needed moving from lying on your back to sitting on the side of a flat bed without using bedrails?: A Little Help needed moving to and from a bed to a chair (including a wheelchair)?: A Little Help needed standing up from a chair using your arms (e.g., wheelchair or bedside chair)?: A Little Help needed to walk in hospital room?: A Little Help needed climbing 3-5 steps with a railing? : A Lot 6 Click Score: 17    End of Session Equipment Utilized During Treatment: Gait belt;Oxygen(3L [place]) Activity Tolerance: Treatment  limited secondary to medical complications (Comment);Patient tolerated treatment well(O2 desat, dyspnea) Patient left: in chair;with chair alarm set;with call bell/phone within reach Nurse Communication: Mobility status PT Visit Diagnosis: Unsteadiness on feet (R26.81);History of falling (Z91.81);Muscle weakness (generalized) (M62.81)    Time: 9753-0051 PT Time Calculation (min) (ACUTE ONLY): 25 min   Charges:   PT Evaluation $PT Eval Moderate Complexity: 1 Mod PT Treatments $Gait Training: 8-22 mins        Gilda Crease, SPT Acute Rehab Services [place] 10/22/2018, 10:03 AM

## 2018-10-22 NOTE — Progress Notes (Signed)
  Echocardiogram 2D Echocardiogram has been performed.  [name] 10/22/2018, 9:55 AM

## 2018-10-22 NOTE — Care Management Note (Signed)
Case Management Note  Patient Details  Name: [name] MRN: [id number] Date of Birth: 06-23-1961  Subjective/Objective:      Acute Resp Failure             Action/Plan: Patient lives at home with her family; PCP: Sandi Mariscal, MD; has private insurance with [facility] Medicare with prescription drug coverage; pharmacy of choice is [facility]; DME - cane, walker and wheelchair at home; [facility] choice offered, pt chose [facility]; [facility] list with rating given to patient and 2nd copy placed on shadow chart; Dan with [facility] called for arrangements. CM will continue to follow for progression of care.  Expected Discharge Date:     Possibly 10/23/2018             Expected Discharge Plan:  [place]  Discharge planning Services  CM Consult  Status of Service:  In process, will continue to follow  Sherrilyn Rist [phone] 10/22/2018, 1:25 PM

## 2018-10-22 NOTE — Progress Notes (Signed)
Patient is experiencing hot burning tingling and pin needle sensation in bilateral lower extremities gave tylenol with no relief. Unable to tolerate gabapentin or lyrica.

## 2018-10-23 DIAGNOSIS — D649 Anemia, unspecified: Secondary | ICD-10-CM

## 2018-10-23 LAB — GLUCOSE, CAPILLARY
Glucose-Capillary: 270 mg/dL — ABNORMAL HIGH (ref 70–99)
Glucose-Capillary: 258 mg/dL — ABNORMAL HIGH (ref 70–99)
Glucose-Capillary: 236 mg/dL — ABNORMAL HIGH (ref 70–99)
Glucose-Capillary: 156 mg/dL — ABNORMAL HIGH (ref 70–99)

## 2018-10-23 LAB — BASIC METABOLIC PANEL
Anion gap: 9 (ref 5–15)
BUN: 54 mg/dL — ABNORMAL HIGH (ref 6–20)
CALCIUM: 9.2 mg/dL (ref 8.9–10.3)
CO2: 27 mmol/L (ref 22–32)
Chloride: 101 mmol/L (ref 98–111)
Creatinine, Ser: 1.45 mg/dL — ABNORMAL HIGH (ref 0.44–1.00)
GFR calc Af Amer: 47 mL/min — ABNORMAL LOW (ref >60)
GFR calc non Af Amer: 40 mL/min — ABNORMAL LOW (ref >60)
Glucose, Bld: 323 mg/dL — ABNORMAL HIGH (ref 70–99)
Potassium: 4.8 mmol/L (ref 3.5–5.1)
Sodium: 137 mmol/L (ref 135–145)

## 2018-10-23 LAB — CBC
HCT: 31.1 % — ABNORMAL LOW (ref 36.0–46.0)
Hemoglobin: 9.1 g/dL — ABNORMAL LOW (ref 12.0–15.0)
MCH: 28.1 pg (ref 26.0–34.0)
MCHC: 29.3 g/dL — ABNORMAL LOW (ref 30.0–36.0)
MCV: 96 fL (ref 80.0–100.0)
Platelets: 299 10*3/uL (ref 150–400)
RBC: 3.24 MIL/uL — ABNORMAL LOW (ref 3.87–5.11)
RDW: 15.4 % (ref 11.5–15.5)
WBC: 13.4 10*3/uL — ABNORMAL HIGH (ref 4.0–10.5)
nRBC: 0 % (ref 0.0–0.2)

## 2018-10-23 MED ORDER — INSULIN GLARGINE 100 UNIT/ML ~~LOC~~ SOLN
85.0000 [IU] | Freq: Every day | SUBCUTANEOUS | Status: DC
  Administered 2018-10-23 – 2018-10-24 (×2): 85 [IU] via SUBCUTANEOUS
  Filled 2018-10-23 (×4): qty 0.85

## 2018-10-23 NOTE — Plan of Care (Signed)

## 2018-10-23 NOTE — Progress Notes (Signed)
PROGRESS NOTE    [name]  VXB:[id number] DOB: [birth year] DOA: 10/21/2018 PCP: Sandi Mariscal, MD    Brief Narrative:  56 y.o. female with medical history significant of COPD, combined diastolic and systolic CHF, PAF, DM type II, PVD, s/p right foot transmetatarsal amputation, tobacco abuse, alcohol abuse, hypothyroidism; who presents with complaints of progressively worsening shortness of breath over the last 1 week.   Complaints of worsening lower extremity swelling, orthopnea, minimally productive cough, intermittent chills, and nausea.  Unsure if her weight is up or down.  Tried utilizing breathing treatments at home without relief of symptoms.  Denies having any fever, vomiting, diarrhea, blood in stool, or recent sick contacts.  She notes scratches and wounds of her legs that she sustained from her dogs.  Notes that they have become more red in appearance.  She also admits to drinking alcohol intermittently, but reports that she does not drink on daily basis.  She still smokes about 1 pack of cigarettes per day on average and normally does require oxygen.   Her blood sugars at home have been running on average in the 200- 300s.  ED Course: Upon admission to the emergency department no initial temperature was obtained.  Patient was noted to be tachypneic up into the 30s and O2 saturations maintained on 2 L of nasal cannula oxygen.  Labs revealed WBC 16.9, hemoglobin 9.6, BUN 23, creatinine 1.07, glucose 353, troponin 0, and BNP 548.  Patient was given 80 mg of Lasix IV and albuterol breathing treatment.  TRH called to admit for acute respiratory failure from suspected COPD and heart failure exacerbation  Assessment & Plan:   Principal Problem:   Acute respiratory failure ([name]) Active Problems:   Alcohol abuse   DM (diabetes mellitus), type 2 with peripheral vascular complications ([name])   Cellulitis   Coronary artery disease   Chronic systolic CHF (congestive heart failure) ([name])   SIRS  (systemic inflammatory response syndrome) ([name])  Acute respiratory failure with hypoxia, COPD and/or CHF exacerbation: Patient presents with complaints of shortness of breath found to be acutely wheezing with rhonchi present.  Patient requiring 2 L nasal cannula oxygen to maintain O2 saturations. - Continuous pulse oximetry with nasal cannula oxygen as needed to keep O2 saturations >92% - Continue scheduled nebs  - continued on IV steroids - wheezing has improved however still sob and with continued LE edema. Continue current regimen  Combined diastolic and systolic congestive heart failure: Patient complains of lower extremity and orthopnea.  Last echocardiogram 11/2016 with EF of 40 to 45% with grade 2 diastolic dysfunction.  BNP elevated at 548.1.  Chest x-ray showing cardiomegaly with mild vascular congestion and small bilateral pleural effusions.  - Continue on IV lasix scheduled -2d echo ordered with EF 45-50% noted -BLE improved, continue lasix  Leukocytosis without sepsis, ruled out -procalcitonin <.0.1 -follow cbc  Cellulitis of the bilateral lower extremities: Acute.  Patient notes increasing redness after sustaining scratches from her dogs.  Increased warmth on physical exam. - Elevate lower extremities - Give course of doxy per above, erythema continuing to improve  Anemia: Acute on chronic.  Patient previously noted to have hemoglobin of 11.9 just 2 months ago,  but presents with hemoglobin down to 9.6.  Denies any reports of bleeding. - continue to follow CBC  Diabetes mellitus type 2, uncontrolled.  Last hemoglobin A1c on file noted to be 9.8.  Patient reports blood sugars running in the 200-300s.  Initial blood glucose elevated at 353. -  a1c of 7.1 - Hypoglycemic protocol - Continue home regimen of insulin, titrate as needed - CBGs q. before meals with additional moderate sliding scale insulin  - glucose in the 300's. Will increase lantus to 85 units   Paroxysmal  atrial fibrillation: Patient currently rate controlled with medications.  Patient reports taking Eliquis as advised - Continue amiodarone and Eliquis - Currently rate controlled  CAD, Peripheral vascular disease - Initially held Plavix due to drop in hemoglobin - Continues statin - chest pain free at this time  Essential hypertension - Continue bisoprolol, losartan, amlodipine - Held metolazone and torsemide - continued on IV diuretics  Hyperlipidemia - Continue atorvastatin -Stable at this time  Hypothyroidism: Last TSH noted to be 3.019 in in May of this year. -TSH reviewed, normal - Continue with levothyroxine  Alcohol abuse: Patient reports only intermittent use of alcohol. - CIWA ordered - No evidence of withdrawals at this time  Tobacco abuse: Patient admits to smoking 1 pack cigarettes per day on average.   - Cessation done face to face. Pt states having quit before - Nicotine patch offered - Remains stable at present  DVT prophylaxis: SCD's Code Status: Full Family Communication: Pt in room, family not at bedside Disposition Plan: Uncertain at this time  Consultants:     Procedures:     Antimicrobials: Anti-infectives (From admission, onward)   Start     Dose/Rate Route Frequency Ordered Stop   10/22/18 1000  azithromycin (ZITHROMAX) tablet 250 mg  Status:  Discontinued     250 mg Oral Daily 10/21/18 1632 10/21/18 1633   10/21/18 2200  doxycycline (VIBRA-TABS) tablet 100 mg     100 mg Oral Every 12 hours 10/21/18 1633 10/26/18 2159   10/21/18 2000  vancomycin (VANCOCIN) 1,250 mg in sodium chloride 0.9 % 250 mL IVPB  Status:  Discontinued     1,250 mg 166.7 mL/hr over 90 Minutes Intravenous Every 12 hours 10/21/18 0537 10/21/18 1632   10/21/18 1645  azithromycin (ZITHROMAX) tablet 500 mg  Status:  Discontinued     500 mg Oral Daily 10/21/18 1632 10/21/18 1633   10/21/18 1400  ceFEPIme (MAXIPIME) 1 g in sodium chloride 0.9 % 100 mL IVPB  Status:   Discontinued     1 g 200 mL/hr over 30 Minutes Intravenous Every 8 hours 10/21/18 0538 10/21/18 1632   10/21/18 0530  ceFEPIme (MAXIPIME) 2 g in sodium chloride 0.9 % 100 mL IVPB     2 g 200 mL/hr over 30 Minutes Intravenous  Once 10/21/18 0527 10/21/18 0626   10/21/18 0530  vancomycin (VANCOCIN) 2,000 mg in sodium chloride 0.9 % 500 mL IVPB     2,000 mg 250 mL/hr over 120 Minutes Intravenous  Once 10/21/18 0527 10/21/18 1225      Subjective: Feels improved, however still with some LE swelling   Objective: Vitals:   10/23/18 0822 10/23/18 0857 10/23/18 1203 10/23/18 1525  BP: (!) 155/69  (!) 140/55   Pulse: 66 75 66 67  Resp: 18 20 19 18   Temp: 98.5 F (36.9 C)  98.7 F (37.1 C)   TempSrc: Oral  Oral   SpO2: 100% 96% 98% 98%  Weight:      Height:        Intake/Output Summary (Last 24 hours) at 10/23/2018 1554 Last data filed at 10/23/2018 1304 Gross per 24 hour  Intake 1000 ml  Output 1750 ml  Net -750 ml   Filed Weights   10/21/18 1606 10/22/18 0338 10/23/18  0409  Weight: 112.2 kg 111.8 kg 112.2 kg    Examination: General exam: Conversant, in no acute distress Respiratory system: normal chest rise, clear, no audible wheezing Cardiovascular system: regular rhythm, s1-s2 Gastrointestinal system: Nondistended, nontender, pos BS Central nervous system: No seizures, no tremors Extremities: No cyanosis, no joint deformities, BLE edema Skin: No rashes, no pallor Psychiatry: Affect normal // no auditory hallucinations   Data Reviewed: I have personally reviewed following labs and imaging studies  CBC: Recent Labs  Lab 10/21/18 0125 10/21/18 1621 10/22/18 0514 10/23/18 0459  WBC 16.9*  --  18.6* 13.4*  NEUTROABS 14.1*  --   --   --   HGB 9.6* 9.1* 8.7* 9.1*  HCT 32.8* 31.4* 30.6* 31.1*  MCV 97.6  --  97.1 96.0  PLT 331  --  338 786   Basic Metabolic Panel: Recent Labs  Lab 10/18/18 1109 10/21/18 0117 10/21/18 1621 10/22/18 0514 10/23/18 0459  NA 143  138 132* 140 137  K 3.8 4.1 4.5 4.1 4.8  CL 110 104 100 102 101  CO2 23 22 25 28 27   GLUCOSE 104* 353* 476* 211* 323*  BUN 19 23* 33* 41* 54*  CREATININE 1.33* 1.07* 1.28* 1.42* 1.45*  CALCIUM 8.8* 8.8* 8.6* 9.3 9.2   GFR: Estimated Creatinine Clearance: 53.1 mL/min (A) (by C-G formula based on SCr of 1.45 mg/dL (H)). Liver Function Tests: No results for input(s): AST, ALT, ALKPHOS, BILITOT, PROT, ALBUMIN in the last 168 hours. No results for input(s): LIPASE, AMYLASE in the last 168 hours. No results for input(s): AMMONIA in the last 168 hours. Coagulation Profile: No results for input(s): INR, PROTIME in the last 168 hours. Cardiac Enzymes: No results for input(s): CKTOTAL, CKMB, CKMBINDEX, TROPONINI in the last 168 hours. BNP (last 3 results) No results for input(s): PROBNP in the last 8760 hours. HbA1C: Recent Labs    10/21/18 0533  HGBA1C 7.1*   CBG: Recent Labs  Lab 10/22/18 1135 10/22/18 1623 10/22/18 2125 10/23/18 0737 10/23/18 1123  GLUCAP 249* 100* 361* 270* 258*   Lipid Profile: No results for input(s): CHOL, HDL, LDLCALC, TRIG, CHOLHDL, LDLDIRECT in the last 72 hours. Thyroid Function Tests: Recent Labs    10/21/18 0533  TSH 1.157   Anemia Panel: No results for input(s): VITAMINB12, FOLATE, FERRITIN, TIBC, IRON, RETICCTPCT in the last 72 hours. Sepsis Labs: Recent Labs  Lab 10/21/18 0533  PROCALCITON <0.10  LATICACIDVEN 1.1    Recent Results (from the past 240 hour(s))  Culture, blood (x 2)     Status: None (Preliminary result)   Collection Time: 10/21/18  5:33 AM  Result Value Ref Range Status   Specimen Description BLOOD RIGHT ANTECUBITAL  Final   Special Requests   Final    BOTTLES DRAWN AEROBIC AND ANAEROBIC Blood Culture adequate volume   Culture   Final    NO GROWTH 2 DAYS Performed at [facility], [address] [place], [place] [address]    Report Status PENDING  Incomplete  Culture, blood (x 2)     Status: None  (Preliminary result)   Collection Time: 10/21/18  5:45 AM  Result Value Ref Range Status   Specimen Description BLOOD RIGHT HAND  Final   Special Requests   Final    BOTTLES DRAWN AEROBIC AND ANAEROBIC Blood Culture adequate volume   Culture   Final    NO GROWTH 2 DAYS Performed at [facility], [place] [address] [place], [place] [address]  Report Status PENDING  Incomplete     Radiology Studies: No results found.  Scheduled Meds: . amiodarone  100 mg Oral Daily  . amLODipine  10 mg Oral Daily  . atorvastatin  40 mg Oral Daily  . bisoprolol  10 mg Oral Daily  . doxycycline  100 mg Oral Q12H  . folic acid  1 mg Oral Daily  . furosemide  40 mg Intravenous BID  . guaiFENesin  600 mg Oral BID  . insulin aspart  0-20 Units Subcutaneous TID WC  . insulin aspart  0-5 Units Subcutaneous QHS  . insulin aspart  30 Units Subcutaneous TID AC  . insulin glargine  80 Units Subcutaneous QHS  . ipratropium-albuterol  3 mL Nebulization TID  . levothyroxine  50 mcg Oral QAC breakfast  . losartan  100 mg Oral Daily  . methylPREDNISolone (SOLU-MEDROL) injection  40 mg Intravenous Daily  . multivitamin with minerals  1 tablet Oral Daily  . nicotine  21 mg Transdermal Daily  . sodium chloride flush  3 mL Intravenous Q12H  . sodium chloride flush  3 mL Intravenous Q12H  . thiamine  100 mg Oral Daily   Or  . thiamine  100 mg Intravenous Daily   Continuous Infusions: . sodium chloride       LOS: 2 days   Marylu Lund, MD [facility] Pager On Amion  If 7PM-7AM, please contact night-coverage 10/23/2018, 3:54 PM

## 2018-10-23 NOTE — Progress Notes (Signed)
Occupational Therapy Treatment Patient Details Name: [name] MRN: [id number] DOB: 08/31/1961 Today's Date: 10/23/2018    History of present illness Pt is a 57 y.o. female admitted for acute respiratory failure, pt had increased SOB. CXR: cardiomegaly, vascular congesrtion and small B pleural effusions. PMH: DM, R transmetatarsal amputation, HTN, PAD, HLD, Afib on Eliquis, CHF, COPD, CKD, Peripheral neuropathy, peripheral athrectomy, left carotid endarterectomy.   OT comments  Pt making good progress with functional goals. Pt ambulated to bathroom form recliner using RW, completed toilet transfer with sup, toileting with min guard A, stood at sink for grooming and hygiene tasks with sup. Pt able to verbalize EC techniques during tasks and reviewed ADL A/E with pt with handout provided. OT will continue to follow acutely  Follow Up Recommendations  Home health OT;Supervision - Intermittent    Equipment Recommendations  Other (comment)(LH shoe horn, LG sponge, sock aid)    Recommendations for Other Services      Precautions / Restrictions Precautions Precautions: Fall Restrictions Weight Bearing Restrictions: No       Mobility Bed Mobility               General bed mobility comments: Pt OOB in recliner upon arrival  Transfers Overall transfer level: Needs assistance Equipment used: Rolling walker (2 wheeled) Transfers: Sit to/from Stand Sit to Stand: Supervision              Balance Overall balance assessment: Needs assistance Sitting-balance support: Feet supported;Bilateral upper extremity supported;No upper extremity supported Sitting balance-Leahy Scale: Good Sitting balance - Comments: in chair, pt donned shoes independently   Standing balance support: Bilateral upper extremity supported;During functional activity;Single extremity supported Standing balance-Leahy Scale: Fair                             ADL either performed or assessed  with clinical judgement   ADL Overall ADL's : Needs assistance/impaired     Grooming: Wash/dry hands;Wash/dry face;Standing;Supervision/safety;Brushing hair;Oral care       Lower Body Bathing: Min guard;Sitting/lateral leans;Sit to/from stand Lower Body Bathing Details (indicate cue type and reason): simulated     Lower Body Dressing: Min guard;Sitting/lateral leans;Sit to/from stand Lower Body Dressing Details (indicate cue type and reason): simulated                     Vision Baseline Vision/History: Wears glasses Wears Glasses: Reading only Patient Visual Report: No change from baseline     Perception     Praxis      Cognition Arousal/Alertness: Awake/alert Behavior During Therapy: WFL for tasks assessed/performed Overall Cognitive Status: Within Functional Limits for tasks assessed                                          Exercises     Shoulder Instructions       General Comments      Pertinent Vitals/ Pain       Pain Assessment: 0-10 Pain Score: 3  Pain Location: Feet, from peripheral neuropathy per pt Pain Descriptors / Indicators: Burning;Tingling Pain Intervention(s): Monitored during session;Repositioned  Home Living Family/patient expects to be discharged to:: Private residence  Prior Functioning/Environment              Frequency  Min 2X/week        Progress Toward Goals  OT Goals(current goals can now be found in the care plan section)  Progress towards OT goals: Progressing toward goals  Acute Rehab OT Goals Patient Stated Goal: go home ADL Goals Pt Will Perform Grooming: with set-up;standing Pt Will Perform Lower Body Bathing: with supervision;with set-up;with adaptive equipment Pt Will Perform Lower Body Dressing: with set-up;with supervision;with adaptive equipment Pt Will Transfer to Toilet: with modified independence;ambulating;regular height  toilet;grab bars Pt Will Perform Toileting - Clothing Manipulation and hygiene: with supervision;sit to/from stand Pt Will Perform Tub/Shower Transfer: with supervision;with modified independence;ambulating;shower seat;grab bars;rolling walker Additional ADL Goal #1: Pt will verbalize and demo 3 EC techniques during ADL/selfcare tasks  Plan      Co-evaluation                 AM-PAC OT "6 Clicks" Daily Activity     Outcome Measure   Help from another person eating meals?: None Help from another person taking care of personal grooming?: A Little Help from another person toileting, which includes using toliet, bedpan, or urinal?: A Little Help from another person bathing (including washing, rinsing, drying)?: A Little Help from another person to put on and taking off regular upper body clothing?: None Help from another person to put on and taking off regular lower body clothing?: A Little 6 Click Score: 20    End of Session Equipment Utilized During Treatment: Gait belt;Rolling walker;Oxygen  OT Visit Diagnosis: Unsteadiness on feet (R26.81);Other abnormalities of gait and mobility (R26.89);Muscle weakness (generalized) (M62.81);Pain Pain - part of body: Ankle and joints of foot   Activity Tolerance Patient tolerated treatment well   Patient Left in chair;with call bell/phone within reach   Nurse Communication          Time: 0911-0927 OT Time Calculation (min): 16 min  Charges: OT General Charges $OT Visit: 1 Visit OT Treatments $Self Care/Home Management : 8-22 mins     Britt Bottom 10/23/2018, 10:35 AM

## 2018-10-23 NOTE — Progress Notes (Signed)
Physical Therapy Treatment Patient Details Name: [name] MRN: [id number] DOB: December 28, 1960 Today's Date: 10/23/2018    History of Present Illness Pt is a 57 y.o. female admitted 10/21/18 for acute respiratory failure. CXR shows cardiomegaly, vascular congestion and small bilateral pleural effusions. PMH includes DM, R transmetatarsal amputation, HTN, PAD, HLD, Afib on Eliquis, CHF, COPD, CKD, peripheral neuropathy, left carotid endarterectomy.   PT Comments    Pt progressing with mobility. Performing ADLs and ambulatory with rollator and supervision for safety; requiring intermittent cues to lock/unlock rollator breaks. Increased time on education regarding importance of self-monitoring activity tolerance and energy conservation techniques; importance of shorter, more frequent bouts of mobility/exercise. Pt declined stair training secondary to fatigue; discussed technique for ascending flight into home. Continue to recommend HHPT services.  SpO2 92% at rest on RA; 89% with ambulation on RA, returned to 94% with seated rest/deep breathing    Follow Up Recommendations  Home health PT;Supervision for mobility/OOB     Equipment Recommendations  None recommended by PT    Recommendations for Other Services       Precautions / Restrictions Precautions Precautions: Fall Restrictions Weight Bearing Restrictions: No    Mobility  Bed Mobility               General bed mobility comments: Received sitting in recliner  Transfers Overall transfer level: Needs assistance Equipment used: 4-wheeled walker;Rolling walker (2 wheeled) Transfers: Sit to/from Stand Sit to Stand: Supervision         General transfer comment: Mod indep standing with RW from recliner and toilet. Only requiring supervision with rollator due to needing cues to lock brakes sitting/standing from rollator seat  Ambulation/Gait Ambulation/Gait assistance: Supervision Gait Distance (Feet): 100  Feet Assistive device: 4-wheeled walker   Gait velocity: Decreased Gait velocity interpretation: 1.31 - 2.62 ft/sec, indicative of limited community ambulator General Gait Details: Amb 27' + seated rest on rollator seat + additional 60'; slow, labored ambulation with rollator, supervision for safety and cues for locking/unlocking rollator brakes. Pt required cues to monitor activity tolerance once DOE increasing. SpO2 down to 89% on RA   Stairs Stairs: (Pt declined stair training; reviewed technique for ascending flight of steps into home; pt reports using R rail and L wall for support with assist from friend; standing rest breaks as needed)           Wheelchair Mobility    Modified Rankin (Stroke Patients Only)       Balance Overall balance assessment: Needs assistance Sitting-balance support: Feet supported;Bilateral upper extremity supported;No upper extremity supported Sitting balance-Leahy Scale: Good Sitting balance - Comments: Indep to don shoes   Standing balance support: Bilateral upper extremity supported;During functional activity;Single extremity supported Standing balance-Leahy Scale: Fair Standing balance comment: Can static stand without UE support; dynamic stability reliant on UE support                            Cognition Arousal/Alertness: Awake/alert Behavior During Therapy: WFL for tasks assessed/performed Overall Cognitive Status: No family/caregiver present to determine baseline cognitive functioning Area of Impairment: Safety/judgement                         Safety/Judgement: Decreased awareness of safety;Decreased awareness of deficits            Exercises      General Comments        Pertinent Vitals/Pain Pain Assessment: Faces  Pain Score: 3  Faces Pain Scale: Hurts little more Pain Location: L foot, from peripheral neuropathy per pt Pain Descriptors / Indicators: Burning;Tingling Pain Intervention(s):  Monitored during session;Limited activity within patient's tolerance    Home Living Family/patient expects to be discharged to:: Private residence                    Prior Function            PT Goals (current goals can now be found in the care plan section) Acute Rehab PT Goals Patient Stated Goal: go home PT Goal Formulation: With patient Time For Goal Achievement: 11/05/18 Potential to Achieve Goals: Good Progress towards PT goals: Progressing toward goals    Frequency    Min 3X/week      PT Plan Current plan remains appropriate    Co-evaluation              AM-PAC PT "6 Clicks" Mobility   Outcome Measure  Help needed turning from your back to your side while in a flat bed without using bedrails?: A Little Help needed moving from lying on your back to sitting on the side of a flat bed without using bedrails?: A Little Help needed moving to and from a bed to a chair (including a wheelchair)?: A Little Help needed standing up from a chair using your arms (e.g., wheelchair or bedside chair)?: A Little Help needed to walk in hospital room?: A Little Help needed climbing 3-5 steps with a railing? : A Lot 6 Click Score: 17    End of Session Equipment Utilized During Treatment: Gait belt;Oxygen Activity Tolerance: Patient tolerated treatment well;Patient limited by fatigue Patient left: in chair;with call bell/phone within reach Nurse Communication: Mobility status PT Visit Diagnosis: Unsteadiness on feet (R26.81);History of falling (Z91.81);Muscle weakness (generalized) (M62.81)     Time: 1015-1040 PT Time Calculation (min) (ACUTE ONLY): 25 min  Charges:  $Gait Training: 8-22 mins $Therapeutic Activity: 8-22 mins                    Mabeline Caras, PT, DPT Acute Rehabilitation Services  Pager [phone] [place] 10/23/2018, 11:10 AM

## 2018-10-23 NOTE — Progress Notes (Signed)
This encounter was created in error - please disregard.

## 2018-10-24 DIAGNOSIS — E1151 Type 2 diabetes mellitus with diabetic peripheral angiopathy without gangrene: Secondary | ICD-10-CM

## 2018-10-24 DIAGNOSIS — J9600 Acute respiratory failure, unspecified whether with hypoxia or hypercapnia: Secondary | ICD-10-CM

## 2018-10-24 DIAGNOSIS — L03119 Cellulitis of unspecified part of limb: Secondary | ICD-10-CM

## 2018-10-24 DIAGNOSIS — I2510 Atherosclerotic heart disease of native coronary artery without angina pectoris: Secondary | ICD-10-CM

## 2018-10-24 DIAGNOSIS — F1010 Alcohol abuse, uncomplicated: Secondary | ICD-10-CM

## 2018-10-24 LAB — GLUCOSE, CAPILLARY
GLUCOSE-CAPILLARY: 81 mg/dL (ref 70–99)
Glucose-Capillary: 64 mg/dL — ABNORMAL LOW (ref 70–99)
Glucose-Capillary: 209 mg/dL — ABNORMAL HIGH (ref 70–99)
Glucose-Capillary: 187 mg/dL — ABNORMAL HIGH (ref 70–99)

## 2018-10-24 LAB — CBC
HCT: 33.5 % — ABNORMAL LOW (ref 36.0–46.0)
Hemoglobin: 10.4 g/dL — ABNORMAL LOW (ref 12.0–15.0)
MCH: 28.7 pg (ref 26.0–34.0)
MCHC: 31 g/dL (ref 30.0–36.0)
MCV: 92.5 fL (ref 80.0–100.0)
Platelets: 357 10*3/uL (ref 150–400)
RBC: 3.62 MIL/uL — ABNORMAL LOW (ref 3.87–5.11)
RDW: 15 % (ref 11.5–15.5)
WBC: 14.4 10*3/uL — AB (ref 4.0–10.5)
nRBC: 0 % (ref 0.0–0.2)

## 2018-10-24 LAB — BASIC METABOLIC PANEL
Anion gap: 9 (ref 5–15)
BUN: 46 mg/dL — ABNORMAL HIGH (ref 6–20)
CO2: 29 mmol/L (ref 22–32)
Calcium: 9.6 mg/dL (ref 8.9–10.3)
Chloride: 103 mmol/L (ref 98–111)
Creatinine, Ser: 1.18 mg/dL — ABNORMAL HIGH (ref 0.44–1.00)
GFR calc Af Amer: 60 mL/min — ABNORMAL LOW (ref >60)
GFR calc non Af Amer: 52 mL/min — ABNORMAL LOW (ref >60)
Glucose, Bld: 78 mg/dL (ref 70–99)
Potassium: 3.8 mmol/L (ref 3.5–5.1)
Sodium: 141 mmol/L (ref 135–145)

## 2018-10-24 NOTE — Progress Notes (Addendum)
Hypoglycemic Event  CBG: 64  Treatment: 4oz of juice given  Symptoms: None  Follow-up CBG: Time: 0800 CBG Result: 81  Possible Reasons for Event:  Comments/MD notified: Dr Osvaldo Shipper, [facility]

## 2018-10-24 NOTE — Progress Notes (Signed)
PROGRESS NOTE  [name]:[id number] DOB: 1961-10-13 DOA: 10/21/2018 PCP: Sandi Mariscal, MD  HPI/Recap of past 24 hours:  57 y.o.femalewith medical history significant ofCOPD, combined diastolic and systolic CHF, PAF, DM type II, PVD, s/pright foot transmetatarsal amputation, tobacco abuse, alcohol abuse, hypothyroidism; who presents with complaints of progressively worsening shortness of breath over the last 1 week.Complaints of worsening lower extremity swelling, orthopnea, minimally productive cough, intermittent chills, and nausea. Unsure if her weight is up or down. Tried utilizing breathing treatments at home without relief of symptoms. Denies having any fever, vomiting, diarrhea, blood in stool, or recent sick contacts. She notes scratches and wounds of her legs that she sustained from her dogs. Notes that they have become more red in appearance. She also admits to drinking alcohol intermittently, but reports that she does not drink on daily basis. She still smokes about 1 pack of cigarettes per day on average and normally does require oxygen. Her blood sugars at home have been running on average in the 200-300s.  10/24/2018: Patient seen and examined her bedside.  No acute events overnight.  Reports wheezing and white sputum production with coughing.   Assessment/Plan: Principal Problem:   Acute respiratory failure ([name]) Active Problems:   Alcohol abuse   DM (diabetes mellitus), type 2 with peripheral vascular complications ([name])   Cellulitis   Coronary artery disease   Chronic systolic CHF (congestive heart failure) ([name])   SIRS (systemic inflammatory response syndrome) ([name])  Acute hypoxic respiratory failure secondary to acute on chronic combined CHF exacerbation versus others 2D echo done during this admission revealed LVEF 45 to 50% unchanged from prior. Continue cardiac medications Continue to monitor I's and O's and daily weight Maintain O2 saturation  greater than 92%  Acute on chronic combined diastolic and systolic CHF Management as stated above  Bilateral lower extremity cellulitis Continue oral doxycycline  Leukocytosis, suspect reactive On Solu-Medrol 40 mg daily No sign of active infective process Cellulitis is being treated Afebrile  AKI on CKD 3  Baseline creatinine appears to be 1.1.  Patient is at her baseline GFR is 52 Creatinine yesterday was 1.45 with GFR 40 Continue to avoid nephrotoxic agent Repeat BMP in the morning  Paroxysmal atrial fibrillation: Patient currently rate controlled with medications. Patient reports taking Eliquis as advised -Continue amiodarone and Eliquis - Currently rate controlled  CAD,Peripheral vascular disease -Initially held Plavix due to drop in hemoglobin -Continuesstatin - chest pain free at this time  Essential hypertension -Continue bisoprolol, losartan, amlodipine -Held metolazone and torsemide - continued on IV diuretics  Hyperlipidemia -Continue atorvastatin -Stable at this time  Hypothyroidism: Last TSH noted to be 3.019 in in May of this year. -TSH reviewed, normal - Continue with levothyroxine  Alcohol abuse: Patient reports only intermittent use of alcohol. -CIWA ordered - No evidence of withdrawals at this time  Tobacco abuse: Patient admits to smoking 1 pack cigarettes per day on average. -Cessation done face to face. Pt states having quit before -Nicotine patch offered - Remains stable at present  DVT prophylaxis: SCD's Code Status: Full Family Communication: Pt in room, family not at bedside Disposition Plan: Uncertain at this time  Consultants:   None  Procedures:   None  Objective: Vitals:   10/23/18 2300 10/24/18 0403 10/24/18 0926 10/24/18 0938  BP:  (!) 162/94 (!) 157/70   Pulse: 78 82 77 70  Resp:  18  16  Temp: 98.1 F (36.7 C) 98.4 F (36.9 C)    TempSrc:  Oral Oral    SpO2:  99%  93%  Weight:  110.5  kg    Height:        Intake/Output Summary (Last 24 hours) at 10/24/2018 1113 Last data filed at 10/24/2018 1048 Gross per 24 hour  Intake 1243 ml  Output 1700 ml  Net -457 ml   Filed Weights   10/22/18 0338 10/23/18 0409 10/24/18 0403  Weight: 111.8 kg 112.2 kg 110.5 kg    Exam:  . General: 57 y.o. year-old female well developed well nourished in no acute distress.  Alert and oriented x3. . Cardiovascular: Regular rate and rhythm with no rubs or gallops.  No thyromegaly or JVD noted.   [name] Respiratory: Mild rales at bases and mild diffuse wheezes noted.  Good inspiratory effort. . Abdomen: Soft nontender nondistended with normal bowel sounds x4 quadrants. . Musculoskeletal: No lower extremity edema. 2/4 pulses in all 4 extremities. . Skin: Bilateral lower extremity erythema and warmth with tenderness on palpation. [name] Psychiatry: Mood is appropriate for condition and setting   Data Reviewed: CBC: Recent Labs  Lab 10/21/18 0125 10/21/18 1621 10/22/18 0514 10/23/18 0459 10/24/18 0533  WBC 16.9*  --  18.6* 13.4* 14.4*  NEUTROABS 14.1*  --   --   --   --   HGB 9.6* 9.1* 8.7* 9.1* 10.4*  HCT 32.8* 31.4* 30.6* 31.1* 33.5*  MCV 97.6  --  97.1 96.0 92.5  PLT 331  --  338 299 366   Basic Metabolic Panel: Recent Labs  Lab 10/21/18 0117 10/21/18 1621 10/22/18 0514 10/23/18 0459 10/24/18 0533  NA 138 132* 140 137 141  K 4.1 4.5 4.1 4.8 3.8  CL 104 100 102 101 103  CO2 22 25 28 27 29   GLUCOSE 353* 476* 211* 323* 78  BUN 23* 33* 41* 54* 46*  CREATININE 1.07* 1.28* 1.42* 1.45* 1.18*  CALCIUM 8.8* 8.6* 9.3 9.2 9.6   GFR: Estimated Creatinine Clearance: 64.7 mL/min (A) (by C-G formula based on SCr of 1.18 mg/dL (H)). Liver Function Tests: No results for input(s): AST, ALT, ALKPHOS, BILITOT, PROT, ALBUMIN in the last 168 hours. No results for input(s): LIPASE, AMYLASE in the last 168 hours. No results for input(s): AMMONIA in the last 168 hours. Coagulation Profile: No  results for input(s): INR, PROTIME in the last 168 hours. Cardiac Enzymes: No results for input(s): CKTOTAL, CKMB, CKMBINDEX, TROPONINI in the last 168 hours. BNP (last 3 results) No results for input(s): PROBNP in the last 8760 hours. HbA1C: No results for input(s): HGBA1C in the last 72 hours. CBG: Recent Labs  Lab 10/23/18 1123 10/23/18 1622 10/23/18 2107 10/24/18 0730 10/24/18 0751  GLUCAP 258* 236* 156* 64* 81   Lipid Profile: No results for input(s): CHOL, HDL, LDLCALC, TRIG, CHOLHDL, LDLDIRECT in the last 72 hours. Thyroid Function Tests: No results for input(s): TSH, T4TOTAL, FREET4, T3FREE, THYROIDAB in the last 72 hours. Anemia Panel: No results for input(s): VITAMINB12, FOLATE, FERRITIN, TIBC, IRON, RETICCTPCT in the last 72 hours. Urine analysis:    Component Value Date/Time   COLORURINE YELLOW 08/18/2018 [place] 08/18/2018 0519   LABSPEC 1.011 08/18/2018 0519   PHURINE 5.0 08/18/2018 0519   GLUCOSEU 50 (A) 08/18/2018 0519   HGBUR SMALL (A) 08/18/2018 [place] 08/18/2018 [place] 08/18/2018 0519   PROTEINUR NEGATIVE 08/18/2018 0519   UROBILINOGEN 0.2 09/05/2015 0012   NITRITE NEGATIVE 08/18/2018 0519   LEUKOCYTESUR NEGATIVE 08/18/2018 0519  Sepsis Labs: @LABRCNTIP (procalcitonin:4,lacticidven:4)  ) Recent Results (from the past 240 hour(s))  Culture, blood (x 2)     Status: None (Preliminary result)   Collection Time: 10/21/18  5:33 AM  Result Value Ref Range Status   Specimen Description BLOOD RIGHT ANTECUBITAL  Final   Special Requests   Final    BOTTLES DRAWN AEROBIC AND ANAEROBIC Blood Culture adequate volume   Culture   Final    NO GROWTH 2 DAYS Performed at [facility], [address], [place], [place] [address]    Report Status PENDING  Incomplete  Culture, blood (x 2)     Status: None (Preliminary result)   Collection Time: 10/21/18  5:45 AM  Result Value Ref Range Status    Specimen Description BLOOD RIGHT HAND  Final   Special Requests   Final    BOTTLES DRAWN AEROBIC AND ANAEROBIC Blood Culture adequate volume   Culture   Final    NO GROWTH 2 DAYS Performed at [facility], [place] [address] [place], [place] [address]    Report Status PENDING  Incomplete      Studies: No results found.  Scheduled Meds: . amiodarone  100 mg Oral Daily  . amLODipine  10 mg Oral Daily  . atorvastatin  40 mg Oral Daily  . bisoprolol  10 mg Oral Daily  . doxycycline  100 mg Oral Q12H  . folic acid  1 mg Oral Daily  . furosemide  40 mg Intravenous BID  . guaiFENesin  600 mg Oral BID  . insulin aspart  0-20 Units Subcutaneous TID WC  . insulin aspart  0-5 Units Subcutaneous QHS  . insulin aspart  30 Units Subcutaneous TID AC  . insulin glargine  85 Units Subcutaneous QHS  . ipratropium-albuterol  3 mL Nebulization TID  . levothyroxine  50 mcg Oral QAC breakfast  . losartan  100 mg Oral Daily  . methylPREDNISolone (SOLU-MEDROL) injection  40 mg Intravenous Daily  . multivitamin with minerals  1 tablet Oral Daily  . nicotine  21 mg Transdermal Daily  . sodium chloride flush  3 mL Intravenous Q12H  . sodium chloride flush  3 mL Intravenous Q12H  . thiamine  100 mg Oral Daily   Or  . thiamine  100 mg Intravenous Daily    Continuous Infusions: . sodium chloride       LOS: 3 days     Kayleen Memos, MD [facility] Pager [phone]  If 7PM-7AM, please contact night-coverage [email or web address] Password [email or web address] 10/24/2018, 11:13 AM

## 2018-10-24 NOTE — Plan of Care (Signed)
  Problem: Nutritional: Goal: Maintenance of adequate nutrition will improve Outcome: Progressing   Problem: Metabolic: Goal: Ability to maintain appropriate glucose levels will improve Outcome: Progressing   Problem: Safety: Goal: Ability to remain free from injury will improve Outcome: Progressing   Problem: Pain Managment: Goal: General experience of comfort will improve Outcome: Progressing   Problem: Activity: Goal: Risk for activity intolerance will decrease Outcome: Progressing

## 2018-10-24 NOTE — Plan of Care (Signed)
  Problem: Education: Goal: Knowledge of General Education information will improve Description Including pain rating scale, medication(s)/side effects and non-pharmacologic comfort measures Outcome: Progressing   Problem: Health Behavior/Discharge Planning: Goal: Ability to manage health-related needs will improve Outcome: Progressing   Problem: Clinical Measurements: Goal: Ability to maintain clinical measurements within normal limits will improve Outcome: Progressing Goal: Will remain free from infection Outcome: Progressing Goal: Diagnostic test results will improve Outcome: Progressing Goal: Respiratory complications will improve Outcome: Progressing Goal: Cardiovascular complication will be avoided Outcome: Progressing   Problem: Nutrition: Goal: Adequate nutrition will be maintained Outcome: Progressing   Problem: Elimination: Goal: Will not experience complications related to bowel motility Outcome: Progressing Goal: Will not experience complications related to urinary retention Outcome: Progressing   Problem: Safety: Goal: Ability to remain free from injury will improve Outcome: Progressing   Problem: Skin Integrity: Goal: Risk for impaired skin integrity will decrease Outcome: Progressing   Problem: Education: Goal: Ability to demonstrate management of disease process will improve Outcome: Progressing Goal: Ability to verbalize understanding of medication therapies will improve Outcome: Progressing   Problem: Activity: Goal: Capacity to carry out activities will improve Outcome: Progressing   Problem: Cardiac: Goal: Ability to achieve and maintain adequate cardiopulmonary perfusion will improve Outcome: Progressing   Problem: Education: Goal: Ability to describe self-care measures that may prevent or decrease complications (Diabetes Survival Skills Education) will improve Outcome: Progressing   Problem: Coping: Goal: Ability to adjust to condition  or change in health will improve Outcome: Progressing   Problem: Fluid Volume: Goal: Ability to maintain a balanced intake and output will improve Outcome: Progressing   Problem: Health Behavior/Discharge Planning: Goal: Ability to identify and utilize available resources and services will improve Outcome: Progressing Goal: Ability to manage health-related needs will improve Outcome: Progressing   Problem: Metabolic: Goal: Ability to maintain appropriate glucose levels will improve Outcome: Progressing   Problem: Nutritional: Goal: Maintenance of adequate nutrition will improve Outcome: Progressing Goal: Progress toward achieving an optimal weight will improve Outcome: Progressing   Problem: Skin Integrity: Goal: Risk for impaired skin integrity will decrease Outcome: Progressing   Problem: Tissue Perfusion: Goal: Adequacy of tissue perfusion will improve Outcome: Progressing

## 2018-10-24 NOTE — Care Management Important Message (Signed)
Important Message  Patient Details  Name: [name] MRN: [id number] Date of Birth: [birth year]   Medicare Important Message Given:  Yes    Carisa Backhaus P [place] 10/24/2018, 12:19 PM

## 2018-10-25 DIAGNOSIS — R6510 Systemic inflammatory response syndrome (SIRS) of non-infectious origin without acute organ dysfunction: Secondary | ICD-10-CM

## 2018-10-25 LAB — GLUCOSE, CAPILLARY
Glucose-Capillary: 250 mg/dL — ABNORMAL HIGH (ref 70–99)
Glucose-Capillary: 126 mg/dL — ABNORMAL HIGH (ref 70–99)
Glucose-Capillary: 90 mg/dL (ref 70–99)

## 2018-10-25 MED ORDER — ADULT MULTIVITAMIN W/MINERALS CH: 1.0000 | ORAL_TABLET | Freq: Every day | ORAL | 0 refills | Status: DC

## 2018-10-25 MED ORDER — FOLIC ACID 1 MG PO TABS: 1.0000 mg | ORAL_TABLET | Freq: Every day | ORAL | 0 refills | Status: DC

## 2018-10-25 MED ORDER — THIAMINE HCL 100 MG PO TABS: 100.0000 mg | ORAL_TABLET | Freq: Every day | ORAL | 0 refills | Status: DC

## 2018-10-25 MED ORDER — IPRATROPIUM-ALBUTEROL 0.5-2.5 (3) MG/3ML IN SOLN: 3.0000 mL | Freq: Four times a day (QID) | RESPIRATORY_TRACT | Status: DC | PRN

## 2018-10-25 NOTE — Discharge Summary (Addendum)
Discharge Summary  [name] XHB:[id number] DOB: 1961-04-28  PCP: Sandi Mariscal, MD  Admit date: 10/21/2018 Discharge date: 10/25/2018  Time spent: 35 minutes  Recommendations for Outpatient Follow-up:  1. Follow-up with your PCP 2. Follow-up with your cardiologist 3. Take your medications as prescribed 4. Continue physical therapy 5. Fall precautions  Discharge Diagnoses:  Active Hospital Problems   Diagnosis Date Noted  . Acute respiratory failure ([place]) 10/21/2018  . SIRS (systemic inflammatory response syndrome) ([place]) 04/06/2017  . Chronic systolic CHF (congestive heart failure) ([place]) 02/12/2016  . Coronary artery disease   . Cellulitis 06/13/2013  . Alcohol abuse   . DM (diabetes mellitus), type 2 with peripheral vascular complications [facility])     Resolved Hospital Problems  No resolved problems to display.    Discharge Condition: Stable  Diet recommendation: Resume previous diet  Vitals:   10/25/18 0834 10/25/18 0852  BP: (!) 149/70   Pulse: 72   Resp: 18   Temp: 98.4 F (36.9 C)   SpO2: 96% 97%    History of present illness:   57 y.o.femalewith medical history significant ofCOPD, combined diastolic and systolic CHF, PAF, DM type II, PVD, s/pright foot transmetatarsal amputation, tobacco abuse, alcohol abuse, hypothyroidism; who presents with complaints of progressively worsening shortness of breath over the last 1 week.Complaints of worsening lower extremity swelling, orthopnea, minimally productive cough, intermittent chills, and nausea. Unsure if her weight is up or down. Tried utilizing breathing treatments at home without relief of symptoms. Denies having any fever, vomiting, diarrhea, blood in stool, or recent sick contacts. She notes scratches and wounds of her legs that she sustained from her dogs. Notes that they have become more red in appearance. She also admits to drinking alcohol intermittently, but reports that she does not drink on daily  basis. She still smokes about 1 pack of cigarettes per day on average and normally does require oxygen. Her blood sugars at home have been running on average in the 200-300s.  10/25/2018: Patient seen and examined at bedside.  No acute events overnight.  She has no new complaints.  States she feels better.  Her breathing is better.  Denies any chest pain.  On the day of discharge, the patient was hemodynamically stable.  She will need to follow-up with her primary care provider within a week and continue physical therapy at home.  Hospital Course:  Principal Problem:   Acute respiratory failure ([name]) Active Problems:   Alcohol abuse   DM (diabetes mellitus), type 2 with peripheral vascular complications ([name])   Cellulitis   Coronary artery disease   Chronic systolic CHF (congestive heart failure) ([name])   SIRS (systemic inflammatory response syndrome) ([name])  Acute hypoxic respiratory failure secondary to acute on chronic combined CHF exacerbation versus others 2D echo done during this admission revealed LVEF 45 to 50% unchanged from prior. Continue cardiac medications Follow-up with your cardiologist within a week  Acute on chronic combined diastolic and systolic CHF Management as stated above  Bilateral lower extremity cellulitis Continue oral doxycycline  Leukocytosis, suspect reactive Suspect reactive from steroid use Follow-up with your PCP within a week  AKI on CKD 3  Baseline creatinine appears to be 1.1.  Patient is at her baseline GFR is 52 Follow-up with your PCP within a week  Paroxysmal atrial fibrillation: - Patient currently rate controlled with medications.  -Continue amiodarone and Eliquis - Follow-up with your cardiologist within a week  CAD,Peripheral vascular disease -Initially held Plavix due to drop in  hemoglobin -Continuesstatin and Plavix -chest pain free   Essential hypertension - Blood pressure is stable - Continue home  antihypertensive medications  Hyperlipidemia -Continue atorvastatin  Hypothyroidism: -Continue with levothyroxine  Alcohol abuse: - Patient reports only intermittent use of alcohol. -CIWA protocol in place - No evidence of withdrawals  Tobacco abuse:  - Patient admits to smoking 1 pack cigarettes per day on average. - Cessation counseling -Nicotine patch offered   Code Status:Full   Consultants:  None  Procedures:  None   Discharge Exam: BP (!) 149/70 (BP Location: Left Arm)   Pulse 72   Temp 98.4 F (36.9 C) (Oral)   Resp 18   Ht 5\' 4"  (1.626 m)   Wt 108.7 kg Comment: scale a  LMP 11/27/2012   SpO2 97%   BMI 41.13 kg/m  . General: 57 y.o. year-old female well developed well nourished in no acute distress.  Alert and oriented x3. . Cardiovascular: Regular rate and rhythm with no rubs or gallops.  No thyromegaly or JVD noted.   [name] Respiratory: Clear to auscultation with no wheezes or rales. Good inspiratory effort. . Abdomen: Soft nontender nondistended with normal bowel sounds x4 quadrants. . Musculoskeletal: No lower extremity edema. 2/4 pulses in all 4 extremities. [name] Psychiatry: Mood is appropriate for condition and setting  Discharge Instructions You were cared for by a hospitalist during your hospital stay. If you have any questions about your discharge medications or the care you received while you were in the hospital after you are discharged, you can call the unit and asked to speak with the hospitalist on call if the hospitalist that took care of you is not available. Once you are discharged, your primary care physician will handle any further medical issues. Please note that NO REFILLS for any discharge medications will be authorized once you are discharged, as it is imperative that you return to your primary care physician (or establish a relationship with a primary care physician if you do not have one) for your aftercare needs so that  they can reassess your need for medications and monitor your lab values.   Allergies as of 10/25/2018      Reactions   Gabapentin Nausea And Vomiting, Other (See Comments)   POSSIBLE SHAKING? TAKING MED CURRENTLY   Lyrica [pregabalin] Other (See Comments)   Shaking Pt still taking lyrica--"probably will stop taking"       Medication List    STOP taking these medications   acetaminophen 325 MG tablet Commonly known as:  TYLENOL   clopidogrel 75 MG tablet Commonly known as:  PLAVIX   DULoxetine 60 MG capsule Commonly known as:  CYMBALTA   metolazone 2.5 MG tablet Commonly known as:  ZAROXOLYN     TAKE these medications   albuterol 108 (90 Base) MCG/ACT inhaler Commonly known as:  PROVENTIL HFA;VENTOLIN HFA Inhale 2 puffs into the lungs every 6 (six) hours as needed for wheezing or shortness of breath.   amiodarone 100 MG tablet Commonly known as:  PACERONE TAKE 1 TABLET (100 MG TOTAL) BY MOUTH DAILY What changed:  See the new instructions.   amLODipine 10 MG tablet Commonly known as:  NORVASC Take 1 tablet (10 mg total) by mouth daily.   apixaban 5 MG Tabs tablet Commonly known as:  ELIQUIS Take 1 tablet (5 mg total) by mouth 2 (two) times daily.   atorvastatin 40 MG tablet Commonly known as:  LIPITOR TAKE 1 TABLET BY MOUTH ONCE EVERY EVENING What changed:  how much to take  how to take this  when to take this   bisoprolol 10 MG tablet Commonly known as:  ZEBETA Take 1 tablet (10 mg total) by mouth daily.   cholecalciferol 1000 units tablet Commonly known as:  VITAMIN D Take 1,000 Units by mouth daily.   folic acid 1 MG tablet Commonly known as:  FOLVITE Take 1 tablet (1 mg total) by mouth daily.   insulin aspart 100 UNIT/ML injection Commonly known as:  NOVOLOG Inject 30 Units into the skin 3 (three) times daily before meals.   LANTUS SOLOSTAR 100 UNIT/ML Solostar Pen Generic drug:  Insulin Glargine Inject 72 Units into the skin at  bedtime.   levothyroxine 50 MCG tablet Commonly known as:  SYNTHROID, LEVOTHROID Take 1 tablet (50 mcg total) by mouth daily.   losartan 100 MG tablet Commonly known as:  COZAAR Take 1 tablet (100 mg total) by mouth daily.   multivitamin with minerals Tabs tablet Take 1 tablet by mouth daily.   nicotine 21 mg/24hr patch Commonly known as:  NICODERM CQ Place 1 patch (21 mg total) onto the skin daily.   thiamine 100 MG tablet Take 1 tablet (100 mg total) by mouth daily.   torsemide 20 MG tablet Commonly known as:  DEMADEX Take 40 mg by mouth daily.   traZODone 100 MG tablet Commonly known as:  DESYREL Take 100 mg by mouth at bedtime as needed for sleep.      Allergies  Allergen Reactions  . Gabapentin Nausea And Vomiting and Other (See Comments)    POSSIBLE SHAKING? TAKING MED CURRENTLY  . Lyrica [Pregabalin] Other (See Comments)    Shaking Pt still taking lyrica--"probably will stop taking"    Follow-up Information    [facility] [place].   Why:  They will do your home health care at your home Contact information: [address] [place] [address] [phone]        Sandi Mariscal, MD. Call in 1 day.   Specialty:  Internal Medicine Why:  Please call for a post hospital follow-up appointment. Contact information: [place] [address] [phone]        Sandi Mariscal, MD On 11/02/2018.   Specialty:  Internal Medicine Why:  Please GO Dec. 20 at 9:30am Contact information: [place] [address] [phone]        [name], MD. Call on 10/31/2018.   Specialties:  Cardiology, Radiology Why:  Go Dec. 18 at [facility] information: [address] [place] [address] [phone]        Sandi Mariscal, MD.   Specialty:  Internal Medicine Contact information: [place] [address] [phone]            The results of significant diagnostics  from this hospitalization (including imaging, microbiology, ancillary and laboratory) are listed below for reference.    Significant Diagnostic Studies: Dg Chest 2 View  Result Date: 10/21/2018 CLINICAL DATA:  57 year old female with shortness of breath. EXAM: CHEST - 2 VIEW COMPARISON:  Chest radiograph dated 08/18/2018 FINDINGS: Cardiomegaly with mild vascular congestion. No focal consolidation or pneumothorax. Small bilateral pleural effusions. No acute osseous pathology. IMPRESSION: Cardiomegaly with mild vascular congestion and small bilateral pleural effusions. Electronically Signed   By: Anner Crete M.D.   On: 10/21/2018 02:11   Dg Tibia/fibula Left  Result Date: 09/29/2018 CLINICAL DATA:  Fall, leg pain EXAM: LEFT TIBIA AND FIBULA - 2  VIEW COMPARISON:  08/19/2018 FINDINGS: No acute bony abnormality. Specifically, no fracture, subluxation, or dislocation. Vascular calcifications. Mild degenerative changes in the left knee. IMPRESSION: No acute bony abnormality. Electronically Signed   By: Rolm Baptise M.D.   On: 09/29/2018 21:20   Ct Head Wo Contrast  Result Date: 09/29/2018 CLINICAL DATA:  Fall, head trauma.  On anticoagulation. EXAM: CT HEAD WITHOUT CONTRAST TECHNIQUE: Contiguous axial images were obtained from the base of the skull through the vertex without intravenous contrast. 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 No acute intracranial abnormality. Specifically, no hemorrhage, hydrocephalus, mass lesion, acute infarction, or significant intracranial injury. Vascular: No hyperdense vessel or unexpected calcification. Skull: No acute calvarial abnormality. Sinuses/Orbits: Visualized paranasal sinuses and mastoids clear. Orbital soft tissues unremarkable. 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  No acute intracranial abnormality. Electronically Signed   By: Rolm Baptise M.D.   On: 09/29/2018 21:21    Dg Foot Complete Left  Result Date: 09/29/2018 CLINICAL DATA:  Fall.  Left foot pain. EXAM: LEFT FOOT - COMPLETE 3+ VIEW COMPARISON:  None. FINDINGS: No acute bony abnormality. Specifically, no fracture, subluxation, or dislocation. Vascular calcifications noted. IMPRESSION: No acute bony abnormality. Electronically Signed   By: Rolm Baptise M.D.   On: 09/29/2018 21:18    Microbiology: Recent Results (from the past 240 hour(s))  Culture, blood (x 2)     Status: None (Preliminary result)   Collection Time: 10/21/18  5:33 AM  Result Value Ref Range Status   Specimen Description BLOOD RIGHT ANTECUBITAL  Final   Special Requests   Final    BOTTLES DRAWN AEROBIC AND ANAEROBIC Blood Culture adequate volume   Culture   Final    NO GROWTH 4 DAYS Performed at [facility], [address], [place], [place] [address]    Report Status PENDING  Incomplete  Culture, blood (x 2)     Status: None (Preliminary result)   Collection Time: 10/21/18  5:45 AM  Result Value Ref Range Status   Specimen Description BLOOD RIGHT HAND  Final   Special Requests   Final    BOTTLES DRAWN AEROBIC AND ANAEROBIC Blood Culture adequate volume   Culture   Final    NO GROWTH 4 DAYS Performed at [facility], [place] [address], [place], [place] [address]    Report Status PENDING  Incomplete     Labs: Basic Metabolic Panel: Recent Labs  Lab 10/21/18 0117 10/21/18 1621 10/22/18 0514 10/23/18 0459 10/24/18 0533  NA 138 132* 140 137 141  K 4.1 4.5 4.1 4.8 3.8  CL 104 100 102 101 103  CO2 22 25 28 27 29   GLUCOSE 353* 476* 211* 323* 78  BUN 23* 33* 41* 54* 46*  CREATININE 1.07* 1.28* 1.42* 1.45* 1.18*  CALCIUM 8.8* 8.6* 9.3 9.2 9.6   Liver Function Tests: No results for input(s): AST, ALT, ALKPHOS, BILITOT, PROT, ALBUMIN in the last 168 hours. No results for input(s): LIPASE, AMYLASE in the last 168 hours. No results for input(s): AMMONIA in the last 168 hours. CBC: Recent Labs  Lab  10/21/18 0125 10/21/18 1621 10/22/18 0514 10/23/18 0459 10/24/18 0533  WBC 16.9*  --  18.6* 13.4* 14.4*  NEUTROABS 14.1*  --   --   --   --   HGB 9.6* 9.1* 8.7* 9.1* 10.4*  HCT 32.8* 31.4* 30.6* 31.1* 33.5*  MCV 97.6  --  97.1 96.0 92.5  PLT 331  --  338 299 357   Cardiac Enzymes: No results for input(s): CKTOTAL, CKMB, CKMBINDEX, TROPONINI in the last 168 hours. BNP: BNP (last 3 results) Recent Labs    08/18/18 0048 10/18/18 1109 10/21/18 0125  BNP 291.3* 410.0* 548.1*    ProBNP (last 3 results) No results for input(s): PROBNP in the last 8760 hours.  CBG: Recent Labs  Lab 10/24/18 1148 10/24/18 1637 10/24/18 2126 10/25/18 0731 10/25/18 1132  GLUCAP 209* 187* 250* 126* 90       Signed:  Kayleen Memos, MD [facility] 10/25/2018, 3:24 PM

## 2018-10-25 NOTE — Progress Notes (Signed)
Physical Therapy Treatment Patient Details Name: [name] MRN: [id number] DOB: 1960-11-19 Today's Date: 10/25/2018    History of Present Illness Pt is a 57 y.o. female admitted 10/21/18 for acute respiratory failure. CXR shows cardiomegaly, vascular congestion and small bilateral pleural effusions. PMH includes DM, R transmetatarsal amputation, HTN, PAD, HLD, Afib on Eliquis, CHF, COPD, CKD, peripheral neuropathy, left carotid endarterectomy.   PT Comments    Pt progressing well with mobility. Demonstrates improving activity tolerance, able to ambulate 120' with rollator, did not require any seated rest breaks this session. SpO2 90% on RA with ambulation. Reviewed energy conservation strategies. Pt has met short-term acute PT goals; has no further questions or concerns. Hopeful for d/c home this afternoon. Recommend follow-up with HHPT services.    Follow Up Recommendations  Home health PT;Supervision for mobility/OOB     Equipment Recommendations  None recommended by PT    Recommendations for Other Services       Precautions / Restrictions Precautions Precautions: Fall Restrictions Weight Bearing Restrictions: No    Mobility  Bed Mobility               General bed mobility comments: Received sitting in recliner  Transfers Overall transfer level: Modified independent Equipment used: 4-wheeled walker Transfers: Sit to/from Stand              Ambulation/Gait Ambulation/Gait assistance: Supervision Gait Distance (Feet): 120 Feet Assistive device: 4-wheeled walker Gait Pattern/deviations: Decreased stride length;Step-through pattern Gait velocity: Decreased Gait velocity interpretation: 1.31 - 2.62 ft/sec, indicative of limited community ambulator General Gait Details: Slow, steady ambulation with rollator; DOE 2/4, pt did not require any seated rest breaks. SpO2 90% on RA   Stairs Stairs: (Pt declined; reviewed technique to ascend stairs with rail  support, pt reports friends will be present to provide assist if needed)           Wheelchair Mobility    Modified Rankin (Stroke Patients Only)       Balance Overall balance assessment: Needs assistance Sitting-balance support: Feet supported;Bilateral upper extremity supported;No upper extremity supported Sitting balance-Leahy Scale: Good Sitting balance - Comments: Indep to don shoes   Standing balance support: Bilateral upper extremity supported;During functional activity;Single extremity supported Standing balance-Leahy Scale: Fair Standing balance comment: Can static stand without UE support; dynamic stability reliant on UE support                            Cognition Arousal/Alertness: Awake/alert Behavior During Therapy: WFL for tasks assessed/performed Overall Cognitive Status: No family/caregiver present to determine baseline cognitive functioning                           Safety/Judgement: Decreased awareness of deficits            Exercises      General Comments        Pertinent Vitals/Pain Pain Assessment: Faces Faces Pain Scale: Hurts little more Pain Location: L foot, from peripheral neuropathy per pt Pain Descriptors / Indicators: Burning;Tingling;Sharp Pain Intervention(s): Monitored during session    Home Living                      Prior Function            PT Goals (current goals can now be found in the care plan section) Progress towards PT goals: Goals met/education completed, patient discharged from PT  Frequency    Min 3X/week      PT Plan Current plan remains appropriate    Co-evaluation              AM-PAC PT "6 Clicks" Mobility   Outcome Measure  Help needed turning from your back to your side while in a flat bed without using bedrails?: A Little Help needed moving from lying on your back to sitting on the side of a flat bed without using bedrails?: A Little Help needed  moving to and from a bed to a chair (including a wheelchair)?: None Help needed standing up from a chair using your arms (e.g., wheelchair or bedside chair)?: None Help needed to walk in hospital room?: A Little Help needed climbing 3-5 steps with a railing? : A Lot 6 Click Score: 19    End of Session Equipment Utilized During Treatment: Gait belt Activity Tolerance: Patient tolerated treatment well Patient left: in chair;with call bell/phone within reach Nurse Communication: Mobility status PT Visit Diagnosis: Unsteadiness on feet (R26.81);History of falling (Z91.81);Muscle weakness (generalized) (M62.81)     Time: 2370-2301 PT Time Calculation (min) (ACUTE ONLY): 14 min  Charges:  $Gait Training: 8-22 mins                    Mabeline Caras, PT, DPT Acute Rehabilitation Services  Pager [phone] Office [place] 10/25/2018, 4:06 PM

## 2018-10-25 NOTE — Discharge Instructions (Signed)
Chronic Obstructive Pulmonary Disease Exacerbation  Chronic obstructive pulmonary disease (COPD) is a common lung problem. In COPD, the flow of air from the lungs is limited. COPD exacerbations are times that breathing gets worse and you need extra treatment. Without treatment they can be life threatening. If they happen often, your lungs can become more damaged. If your COPD gets worse, your doctor may treat you with:  ? Medicines.  ? Oxygen.  ? Different ways to clear your airway, such as using a mask.    Follow these instructions at home:  ? Do not smoke.  ? Avoid tobacco smoke and other things that bother your lungs.  ? If given, take your antibiotic medicine as told. Finish the medicine even if you start to feel better.  ? Only take medicines as told by your doctor.  ? Drink enough fluids to keep your pee (urine) clear or pale yellow (unless your doctor has told you not to).  ? Use a cool mist machine (vaporizer).  ? If you use oxygen or a machine that turns liquid medicine into a mist (nebulizer), continue to use them as told.  ? Keep up with shots (vaccinations) as told by your doctor.  ? Exercise regularly.  ? Eat healthy foods.  ? Keep all doctor visits as told.  Get help right away if:  ? You are very short of breath and it gets worse.  ? You have trouble talking.  ? You have bad chest pain.  ? You have blood in your spit (sputum).  ? You have a fever.  ? You keep throwing up (vomiting).  ? You feel weak, or you pass out (faint).  ? You feel confused.  ? You keep getting worse.  This information is not intended to replace advice given to you by your health care provider. Make sure you discuss any questions you have with your health care provider.  Document Released: 10/20/2011 Document Revised: 04/07/2016 Document Reviewed: 07/05/2013  [employer]vier Interactive Patient Education ? 2017 [employer]

## 2018-10-26 LAB — CULTURE, BLOOD (ROUTINE X 2)
Culture: NO GROWTH
Culture: NO GROWTH
SPECIAL REQUESTS: ADEQUATE
Special Requests: ADEQUATE

## 2018-10-30 ENCOUNTER — Other Ambulatory Visit (HOSPITAL_COMMUNITY): Payer: Self-pay

## 2018-10-30 NOTE — Progress Notes (Deleted)
Cardiology Office Note   Date:  10/30/2018   ID:  [name], DOB 09/04/1961, MRN [id number]  PCP:  Karla Mariscal, MD  Cardiologist: Dr.McLean  No chief complaint on file.    History of Present Illness: [name] is a 57 y.o. female who presents for ongoing assessment and management of CAD. PAF. Chronic systolic CHF followed by Advanced CHF clinic, carotid stenosis, s/p left CEA. She has also had a peripheral angiography with atherectomy of the right SFA in 5/18, on 6/18 she had right transmetatarsal amputation. Other history includes PAF on Eliquis, COPD, Type II diabetes. ongoing tobacco abuse.  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   Last seen by cardiology on 10/212019 by the Advanced heart failure clinic. Bisoprolol was increased to 10 mg daily, losartan was continued at 25 mg daily. No spironolactone or metolazone at that time due to CKD.   She was recently discharged on 10/25/2018 after admission for acute respiratory failure and decompensated CHF.Echo revealed LVEF of 45%-50%.Treated with IV diuretics, with transition back to po diuretics. Discharge weight 239 lbs. She was taken off of Plavix, and metolazone, continued on torsemide 40 mg daily.  Past Medical History:  Diagnosis Date  . Alcohol abuse   . Alcoholic cirrhosis ([place])   . Anemia   . Anxiety   . B12 deficiency   . CAD (coronary artery disease)    a. cath 11/10/2014 LM nl, LAD min irregs, D1 30 ost, D2 50d, LCX 71m, OM1 80 p/m (1.5 mm vessel), OM2 29m, RCA nondom 42m-->med rx.. Demand ischemia in the setting of rapid a-fib.  . Cardiomyopathy ([place])   . Carotid artery disease ([place])    a. 01/2015 Carotid Angio: RICA 093, LICA 81W. s/p L carotid endarterectomy 02/2015.  . Cellulitis    lower extremities  . Chronic combined systolic and diastolic CHF (congestive heart failure) ([place])    a. Last echo 12/205: EF 40-45%, inferoapical/posterior HK, not technically sufficient to allow eval of LV diastolic  function, normal LA in size..  . Cocaine abuse ([place])   . COPD (chronic obstructive pulmonary disease) ([place])   . Critical lower limb ischemia   . Depression   . Diabetic peripheral neuropathy ([place])   . Elevated troponin    a. Chronic elevation.  [name] GERD (gastroesophageal reflux disease)   . Hyperlipemia   . Hypertension   . Hypokalemia   . Hypomagnesemia   . Hypothyroidism   . Marijuana abuse   . Narcotic abuse ([place])   . Noncompliance   . NSVT (nonsustained ventricular tachycardia) ([place])   . Obesity   . PAF (paroxysmal atrial fibrillation) ([place])   . Paroxysmal atrial tachycardia ([place])   . Peripheral arterial disease ([place])    a. 01/2015 Angio/PTA: LSFA 100 w/ recon @ adductor canal and 1 vessel runoff via AT, RSFA 99 (atherectomy/pta) - 1 vessel runoff via diff dzs peroneal.  . Poorly controlled type 2 diabetes mellitus ([place])   . Renal insufficiency    a. 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  [name] Symptomatic bradycardia    avoid AV blocking agent per EP  . Tobacco abuse     Past Surgical History:  Procedure Laterality Date  . AMPUTATION Right 06/14/2017   Procedure: Right foot transmetatarsal amputation;  Surgeon: Karla Minion, MD;  Location: [place];  Service: Orthopedics;  Laterality: Right;  . AMPUTATION TOE Right 04/28/2017   Procedure: AMPUTATION OF RIGHT SECOND RAY;  Surgeon: Karla Minion, MD;  Location:  [place] OR;  Service: Orthopedics;  Laterality: Right;  . BALLOON ANGIOPLASTY, ARTERY Right 01/15/2015   SFA/notes 01/15/2015  . CARDIAC CATHETERIZATION    . CARDIAC CATHETERIZATION N/A 01/12/2016   Procedure: Temporary Wire;  Surgeon: Karla Breeding, MD;  Location: [place] [facility] LAB;  Service: Cardiovascular;  Laterality: N/A;  . CARDIOVERSION  ~ 02/2013   "twice"   . CAROTID ANGIOGRAM N/A 01/15/2015   Procedure: CAROTID ANGIOGRAM;  Surgeon: Karla Harp, MD;  Location: [facility] CATH LAB;  Service: Cardiovascular;  Laterality: N/A;  . DILATION AND CURETTAGE OF UTERUS  1988  . ENDARTERECTOMY Left  02/19/2015   Procedure: LEFT CAROTID ENDARTERECTOMY ;  Surgeon: Karla Mitchell, MD;  Location: [place];  Service: Vascular;  Laterality: Left;  . LEFT HEART CATHETERIZATION WITH CORONARY ANGIOGRAM N/A 10/31/2014   Procedure: LEFT HEART CATHETERIZATION WITH CORONARY ANGIOGRAM;  Surgeon: Karla Blanks, MD;  Location: [facility] CATH LAB;  Service: Cardiovascular;  Laterality: N/A;  . LOWER EXTREMITY ANGIOGRAM N/A 09/10/2013   Procedure: LOWER EXTREMITY ANGIOGRAM;  Surgeon: Karla Harp, MD;  Location: [facility] CATH LAB;  Service: Cardiovascular;  Laterality: N/A;  . LOWER EXTREMITY ANGIOGRAM N/A 01/15/2015   Procedure: LOWER EXTREMITY ANGIOGRAM;  Surgeon: Karla Harp, MD;  Location: [facility] CATH LAB;  Service: Cardiovascular;  Laterality: N/A;  . LOWER EXTREMITY ANGIOGRAPHY N/A 04/13/2017   Procedure: Lower Extremity Angiography;  Surgeon: Karla Harp, MD;  Location: [place] CV LAB;  Service: Cardiovascular;  Laterality: N/A;  . PERIPHERAL ATHRECTOMY Right 01/15/2015   SFA/notes 01/15/2015  . PERIPHERAL VASCULAR INTERVENTION  04/13/2017   Procedure: Peripheral Vascular Intervention;  Surgeon: Karla Harp, MD;  Location: [place] [facility] LAB;  Service: Cardiovascular;;     Current Outpatient Medications  Medication Sig Dispense Refill  . albuterol (PROVENTIL HFA;VENTOLIN HFA) 108 (90 Base) MCG/ACT inhaler Inhale 2 puffs into the lungs every 6 (six) hours as needed for wheezing or shortness of breath. 18 g 0  . amiodarone (PACERONE) 100 MG tablet TAKE 1 TABLET (100 MG TOTAL) BY MOUTH DAILY (Patient taking differently: Take 100 mg by mouth daily. ) 30 tablet 5  . amLODipine (NORVASC) 10 MG tablet Take 1 tablet (10 mg total) by mouth daily. 30 tablet 5  . apixaban (ELIQUIS) 5 MG TABS tablet Take 1 tablet (5 mg total) by mouth 2 (two) times daily. 60 tablet 0  . atorvastatin (LIPITOR) 40 MG tablet TAKE 1 TABLET BY MOUTH ONCE EVERY EVENING (Patient taking differently: Take 40 mg by mouth daily. TAKE  1 TABLET BY MOUTH ONCE EVERY EVENING) 30 tablet 0  . bisoprolol (ZEBETA) 10 MG tablet Take 1 tablet (10 mg total) by mouth daily. 30 tablet 11  . cholecalciferol (VITAMIN D) 1000 units tablet Take 1,000 Units by mouth daily.    . folic acid (FOLVITE) 1 MG tablet Take 1 tablet (1 mg total) by mouth daily. 30 tablet 0  . insulin aspart (NOVOLOG) 100 UNIT/ML injection Inject 30 Units into the skin 3 (three) times daily before meals. 30 mL 0  . Insulin Glargine (LANTUS SOLOSTAR) 100 UNIT/ML Solostar Pen Inject 72 Units into the skin at bedtime.    [name] levothyroxine (SYNTHROID, LEVOTHROID) 50 MCG tablet Take 1 tablet (50 mcg total) by mouth daily. 30 tablet 0  . losartan (COZAAR) 100 MG tablet Take 1 tablet (100 mg total) by mouth daily. 30 tablet 5  . Multiple Vitamin (MULTIVITAMIN WITH MINERALS) TABS tablet Take 1 tablet by mouth daily. 30 tablet 0  .  nicotine (NICODERM CQ) 21 mg/24hr patch Place 1 patch (21 mg total) onto the skin daily. (Patient not taking: Reported on 10/18/2018) 28 patch 0  . thiamine 100 MG tablet Take 1 tablet (100 mg total) by mouth daily. 30 tablet 0  . torsemide (DEMADEX) 20 MG tablet Take 40 mg by mouth daily.    . traZODone (DESYREL) 100 MG tablet Take 100 mg by mouth at bedtime as needed for sleep.     No current facility-administered medications for this visit.     Allergies:   Gabapentin and Lyrica [pregabalin]    Social History:  The patient  reports that she has been smoking e-cigarettes and cigarettes. She has a 40.00 pack-year smoking history. She has quit using smokeless tobacco.  Her smokeless tobacco use included snuff. She reports current alcohol use. She reports current drug use. Drugs: "Crack" cocaine, Marijuana, and Oxycodone.   Family History:  The patient's family history includes Breast cancer in her mother; Cancer in her mother; Clotting disorder in her mother; Diabetes in her mother; Emphysema in her sister; Heart attack in her mother; Heart disease in  her father and mother; Hypertension in her father and mother.    ROS: All other systems are reviewed and negative. Unless otherwise mentioned in H&P    PHYSICAL EXAM: VS:  LMP 11/27/2012  , BMI There is no height or weight on file to calculate BMI. GEN: Well nourished, well developed, in no acute distress HEENT: normal Neck: no JVD, carotid bruits, or masses Cardiac: ***RRR; no murmurs, rubs, or gallops,no edema  Respiratory:  Clear to auscultation bilaterally, normal work of breathing GI: soft, nontender, nondistended, + BS MS: no deformity or atrophy Skin: warm and dry, no rash Neuro:  Strength and sensation are intact Psych: euthymic mood, full affect   EKG:  EKG {ACTION; IS/IS [id number]} ordered today. The ekg ordered today demonstrates ***   Recent Labs: 08/18/2018: ALT 21 10/21/2018: B Natriuretic Peptide 548.1; TSH 1.157 10/24/2018: BUN 46; Creatinine, Ser 1.18; Hemoglobin 10.4; Platelets 357; Potassium 3.8; Sodium 141    Lipid Panel    Component Value Date/Time   CHOL 146 03/27/2018 1138   TRIG 222 (H) 03/27/2018 1138   HDL 39 (L) 03/27/2018 1138   CHOLHDL 3.7 03/27/2018 1138   VLDL 44 (H) 03/27/2018 1138   LDLCALC 63 03/27/2018 1138      Wt Readings from Last 3 Encounters:  10/25/18 239 lb 9.6 oz (108.7 kg)  10/18/18 246 lb (111.6 kg)  10/16/18 241 lb (109.3 kg)      Other studies Reviewed: Additional studies/ records that were reviewed today include: ***. Review of the above records demonstrates: ***   ASSESSMENT AND PLAN:  1.  ***   Current medicines are reviewed at length with the patient today.    Labs/ tests ordered today include: *** Phill Myron. [place] Pugh, ANP, AACC   10/30/2018 7:20 AM    [address] Office [phone] Fax [phone]

## 2018-10-30 NOTE — Progress Notes (Signed)
Paramedicine Encounter    Patient ID: [name], female    DOB: 10-22-1961, 57 y.o.   MRN: [id number]   Patient Care Team: Sandi Mariscal, MD as PCP - General (Internal Medicine) [name], MD as PCP - Cardiology (Cardiology) [name], MD as Consulting Physician (Cardiology) [name], MD as Consulting Physician (Pulmonary Disease) Jorge Ny, LCSW as Social Worker (Licensed Clinical Social Worker)  Patient Active Problem List   Diagnosis Date Noted  . Acute respiratory failure ([place]) 10/21/2018  . AKI (acute kidney injury) ([place]) 08/18/2018  . Coagulation disorder ([place]) 08/09/2017  . Depression 07/21/2017  . At risk for adverse drug reaction 06/20/2017  . Peripheral neuropathy 06/20/2017  . Acute osteomyelitis of right foot ([place]) 06/13/2017  . S/P transmetatarsal amputation of foot, right ([place]) 06/05/2017  . 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 PAD (peripheral artery disease) ([place])   . SIRS (systemic inflammatory response syndrome) ([place]) 04/06/2017  . CKD (chronic kidney disease), stage III ([place]) 11/24/2016  . Suspected sleep apnea 11/24/2016  . Severe obesity (BMI >= 40) ([place]) 02/24/2016  . COPD GOLD 0  02/24/2016  . Chronic systolic CHF (congestive heart failure) ([place]) 02/12/2016  . Encounter for therapeutic drug monitoring 02/10/2016  . Symptomatic bradycardia 01/12/2016  . Essential hypertension 12/22/2015  . Wheeze   . 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 Tobacco abuse 10/23/2015  . Coronary artery disease   . DOE (dyspnea on exertion) 04/29/2015  . PAF (paroxysmal atrial fibrillation) ([employer]) 01/16/2015  . Carotid arterial disease ([place]) 01/16/2015  . Claudication ([place]) 01/15/2015  . Demand ischemia ([place]) 10/29/2014  . Insomnia 02/03/2014  . S/P peripheral artery angioplasty - [place] atherectomy; R SFA 09/11/2013    Class: Acute  . Leg pain, bilateral 08/19/2013  .  Hypothyroidism 07/31/2013  . Cellulitis 06/13/2013  . History of cocaine abuse ([place]) 06/13/2013  . Long term current use of anticoagulant therapy 05/20/2013  . Alcohol abuse   . Narcotic abuse ([place])   . Marijuana abuse   . Alcoholic cirrhosis ([place])   . DM (diabetes mellitus), type 2 with peripheral vascular complications ([name])     Current Outpatient Medications:  .  albuterol (PROVENTIL HFA;VENTOLIN HFA) 108 (90 Base) MCG/ACT inhaler, Inhale 2 puffs into the lungs every 6 (six) hours as needed for wheezing or shortness of breath., Disp: 18 g, Rfl: 0 .  amiodarone (PACERONE) 100 MG tablet, TAKE 1 TABLET (100 MG TOTAL) BY MOUTH DAILY (Patient taking differently: Take 100 mg by mouth daily. ), Disp: 30 tablet, Rfl: 5 .  amLODipine (NORVASC) 10 MG tablet, Take 1 tablet (10 mg total) by mouth daily., Disp: 30 tablet, Rfl: 5 .  apixaban (ELIQUIS) 5 MG TABS tablet, Take 1 tablet (5 mg total) by mouth 2 (two) times daily., Disp: 60 tablet, Rfl: 0 .  atorvastatin (LIPITOR) 40 MG tablet, TAKE 1 TABLET BY MOUTH ONCE EVERY EVENING (Patient taking differently: Take 40 mg by mouth daily. TAKE 1 TABLET BY MOUTH ONCE EVERY EVENING), Disp: 30 tablet, Rfl: 0 .  bisoprolol (ZEBETA) 10 MG tablet, Take 1 tablet (10 mg total) by mouth daily., Disp: 30 tablet, Rfl: 11 .  cholecalciferol (VITAMIN D) 1000 units tablet, Take 1,000 Units by mouth daily., Disp: , Rfl:  .  folic acid (FOLVITE) 1 MG tablet, Take 1 tablet (1 mg total) by mouth daily., Disp: 30 tablet, Rfl: 0 .  insulin aspart (NOVOLOG) 100  UNIT/ML injection, Inject 30 Units into the skin 3 (three) times daily before meals., Disp: 30 mL, Rfl: 0 .  Insulin Glargine (LANTUS SOLOSTAR) 100 UNIT/ML Solostar Pen, Inject 72 Units into the skin at bedtime., Disp: , Rfl:  .  levothyroxine (SYNTHROID, LEVOTHROID) 50 MCG tablet, Take 1 tablet (50 mcg total) by mouth daily., Disp: 30 tablet, Rfl: 0 .  losartan (COZAAR) 100 MG tablet, Take 1 tablet (100 mg total) by  mouth daily. (Patient taking differently: Take 100 mg by mouth at bedtime. ), Disp: 30 tablet, Rfl: 5 .  Multiple Vitamin (MULTIVITAMIN WITH MINERALS) TABS tablet, Take 1 tablet by mouth daily., Disp: 30 tablet, Rfl: 0 .  thiamine 100 MG tablet, Take 1 tablet (100 mg total) by mouth daily., Disp: 30 tablet, Rfl: 0 .  torsemide (DEMADEX) 20 MG tablet, Take 40 mg by mouth daily., Disp: , Rfl:  .  traZODone (DESYREL) 100 MG tablet, Take 100 mg by mouth at bedtime as needed for sleep., Disp: , Rfl:  .  nicotine (NICODERM CQ) 21 mg/24hr patch, Place 1 patch (21 mg total) onto the skin daily. (Patient not taking: Reported on 10/18/2018), Disp: 28 patch, Rfl: 0 Allergies  Allergen Reactions  . Gabapentin Nausea And Vomiting and Other (See Comments)    POSSIBLE SHAKING? TAKING MED CURRENTLY  . Lyrica [Pregabalin] Other (See Comments)    Shaking Pt still taking lyrica--"probably will stop taking"       Social History   Socioeconomic History  . Marital status: Divorced    Spouse name: Not on file  . Number of children: 1  . Years of education: 41  . Highest education level: 12th grade  Occupational History  . Occupation: disabled  Social Needs  . Financial resource strain: Somewhat hard  . Food insecurity:    Worry: Never true    Inability: Never true  . Transportation needs:    Medical: No    Non-medical: No  Tobacco Use  . Smoking status: Current Some Day Smoker    Packs/day: 1.00    Years: 40.00    Pack years: 40.00    Types: E-cigarettes, Cigarettes  . Smokeless tobacco: Former Systems developer    Types: Snuff  Substance and Sexual Activity  . Alcohol use: Yes    Alcohol/week: 0.0 standard drinks  . Drug use: Yes    Types: "Crack" cocaine, Marijuana, Oxycodone    Comment: 04/29/2015 "last drug use was ~ 09/08/2013"  . Sexual activity: Not Currently  Lifestyle  . Physical activity:    Days per week: Not on file    Minutes per session: Not on file  . Stress: Not on file   Relationships  . Social connections:    Talks on phone: Not on file    Gets together: Not on file    Attends religious service: Not on file    Active member of club or organization: Not on file    Attends meetings of clubs or organizations: Not on file    Relationship status: Not on file  . Intimate partner violence:    Fear of current or ex partner: Not on file    Emotionally abused: Not on file    Physically abused: Not on file    Forced sexual activity: Not on file  Other Topics Concern  . Not on file  Social History Narrative   Lives in [place], in [place] with sister.  They are looking to move but don't have a place to go yet.  Physical Exam      Future Appointments  Date Time Provider [place]  11/01/2018  9:15 AM [facility] None  12/04/2018 11:40 AM Larey Dresser, MD [facility] None    BP (!) 114/50   Pulse 74   Resp 16   Wt 241 lb (109.3 kg)   LMP 11/27/2012   SpO2 96%   BMI 41.37 kg/m   Weight yesterday-241 Last visit weight-241 [facility]-301  Pt home from hosp, she still has nurse coming out. She got a new bed and has been sleeping in the bed, usually slept in recliner, so now with her laying flat, or trying to she started having sob which caused her to go to ER.  She does not have inhaler, she said she thinks the doc in hosp was going to send it in. We called pharmacy to verify it will be delivered tomor along with his losartan.   Her d/c advises to stop plavix and cymbalta.  Just her walking back to her bedroom she got sob with audible wheezing. after a few min of rest it subsided for the most part.  meds verified and pill box refilled.   Marylouise Stacks, [facility] Paramedic  10/31/18

## 2018-10-31 ENCOUNTER — Ambulatory Visit: Payer: Medicare HMO | Admitting: Adult Health

## 2018-10-31 DIAGNOSIS — R0989 Other specified symptoms and signs involving the circulatory and respiratory systems: Secondary | ICD-10-CM

## 2018-11-01 ENCOUNTER — Ambulatory Visit (HOSPITAL_COMMUNITY)
Admission: RE | Admit: 2018-11-01 | Discharge: 2018-11-01 | Disposition: A | Discharge status: Home / Self Care | Payer: Medicare HMO | Source: Ambulatory Visit | Attending: Internal Medicine | Admitting: Internal Medicine

## 2018-11-01 DIAGNOSIS — I5022 Chronic systolic (congestive) heart failure: Secondary | ICD-10-CM | POA: Insufficient documentation

## 2018-11-01 LAB — BASIC METABOLIC PANEL
Anion gap: 11 (ref 5–15)
BUN: 31 mg/dL — ABNORMAL HIGH (ref 6–20)
CALCIUM: 8.3 mg/dL — AB (ref 8.9–10.3)
CO2: 25 mmol/L (ref 22–32)
Chloride: 105 mmol/L (ref 98–111)
Creatinine, Ser: 1.25 mg/dL — ABNORMAL HIGH (ref 0.44–1.00)
GFR calc non Af Amer: 48 mL/min — ABNORMAL LOW (ref >60)
GFR, EST AFRICAN AMERICAN: 56 mL/min — AB (ref >60)
Glucose, Bld: 199 mg/dL — ABNORMAL HIGH (ref 70–99)
Potassium: 3.9 mmol/L (ref 3.5–5.1)
Sodium: 141 mmol/L (ref 135–145)

## 2018-11-05 ENCOUNTER — Other Ambulatory Visit (HOSPITAL_COMMUNITY): Payer: Self-pay

## 2018-11-05 NOTE — Progress Notes (Signed)
Paramedicine Encounter    Patient ID: [name], female    DOB: 1961/10/10, 57 y.o.   MRN: [id number]   Patient Care Team: Sandi Mariscal, MD as PCP - General (Internal Medicine) [name], MD as PCP - Cardiology (Cardiology) [name], MD as Consulting Physician (Cardiology) [name], MD as Consulting Physician (Pulmonary Disease) Jorge Ny, LCSW as Social Worker (Licensed Clinical Social Worker)  Patient Active Problem List   Diagnosis Date Noted  . Acute respiratory failure ([place]) 10/21/2018  . AKI (acute kidney injury) ([place]) 08/18/2018  . Coagulation disorder ([place]) 08/09/2017  . Depression 07/21/2017  . At risk for adverse drug reaction 06/20/2017  . Peripheral neuropathy 06/20/2017  . Acute osteomyelitis of right foot ([place]) 06/13/2017  . S/P transmetatarsal amputation of foot, right ([place]) 06/05/2017  . 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 PAD (peripheral artery disease) ([place])   . SIRS (systemic inflammatory response syndrome) ([place]) 04/06/2017  . CKD (chronic kidney disease), stage III ([place]) 11/24/2016  . Suspected sleep apnea 11/24/2016  . Severe obesity (BMI >= 40) ([place]) 02/24/2016  . COPD GOLD 0  02/24/2016  . Chronic systolic CHF (congestive heart failure) ([place]) 02/12/2016  . Encounter for therapeutic drug monitoring 02/10/2016  . Symptomatic bradycardia 01/12/2016  . Essential hypertension 12/22/2015  . Wheeze   . 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 Tobacco abuse 10/23/2015  . Coronary artery disease   . DOE (dyspnea on exertion) 04/29/2015  . PAF (paroxysmal atrial fibrillation) ([place]) 01/16/2015  . Carotid arterial disease ([place]) 01/16/2015  . Claudication ([place]) 01/15/2015  . Demand ischemia ([place]) 10/29/2014  . Insomnia 02/03/2014  . S/P peripheral artery angioplasty - TurboHawk atherectomy; R SFA 09/11/2013    Class: Acute  . Leg pain, bilateral 08/19/2013  .  Hypothyroidism 07/31/2013  . Cellulitis 06/13/2013  . History of cocaine abuse ([place]) 06/13/2013  . Long term current use of anticoagulant therapy 05/20/2013  . Alcohol abuse   . Narcotic abuse ([place])   . Marijuana abuse   . Alcoholic cirrhosis ([place])   . DM (diabetes mellitus), type 2 with peripheral vascular complications ([name])     Current Outpatient Medications:  .  albuterol (PROVENTIL HFA;VENTOLIN HFA) 108 (90 Base) MCG/ACT inhaler, Inhale 2 puffs into the lungs every 6 (six) hours as needed for wheezing or shortness of breath., Disp: 18 g, Rfl: 0 .  amiodarone (PACERONE) 100 MG tablet, TAKE 1 TABLET (100 MG TOTAL) BY MOUTH DAILY (Patient taking differently: Take 100 mg by mouth daily. ), Disp: 30 tablet, Rfl: 5 .  amLODipine (NORVASC) 10 MG tablet, Take 1 tablet (10 mg total) by mouth daily., Disp: 30 tablet, Rfl: 5 .  apixaban (ELIQUIS) 5 MG TABS tablet, Take 1 tablet (5 mg total) by mouth 2 (two) times daily., Disp: 60 tablet, Rfl: 0 .  atorvastatin (LIPITOR) 40 MG tablet, TAKE 1 TABLET BY MOUTH ONCE EVERY EVENING (Patient taking differently: Take 40 mg by mouth daily. TAKE 1 TABLET BY MOUTH ONCE EVERY EVENING), Disp: 30 tablet, Rfl: 0 .  bisoprolol (ZEBETA) 10 MG tablet, Take 1 tablet (10 mg total) by mouth daily., Disp: 30 tablet, Rfl: 11 .  cholecalciferol (VITAMIN D) 1000 units tablet, Take 1,000 Units by mouth daily. , Disp: , Rfl:  .  folic acid (FOLVITE) 1 MG tablet, Take 1 tablet (1 mg total) by mouth daily. (Patient taking differently: Take 1 mg by mouth daily. ),  Disp: 30 tablet, Rfl: 0 .  insulin aspart (NOVOLOG) 100 UNIT/ML injection, Inject 30 Units into the skin 3 (three) times daily before meals., Disp: 30 mL, Rfl: 0 .  Insulin Glargine (LANTUS SOLOSTAR) 100 UNIT/ML Solostar Pen, Inject 72 Units into the skin at bedtime., Disp: , Rfl:  .  levothyroxine (SYNTHROID, LEVOTHROID) 50 MCG tablet, Take 1 tablet (50 mcg total) by mouth daily., Disp: 30 tablet, Rfl: 0 .  losartan  (COZAAR) 100 MG tablet, Take 1 tablet (100 mg total) by mouth daily. (Patient taking differently: Take 100 mg by mouth at bedtime. ), Disp: 30 tablet, Rfl: 5 .  Multiple Vitamin (MULTIVITAMIN WITH MINERALS) TABS tablet, Take 1 tablet by mouth daily., Disp: 30 tablet, Rfl: 0 .  thiamine 100 MG tablet, Take 1 tablet (100 mg total) by mouth daily. (Patient taking differently: Take 100 mg by mouth daily. ), Disp: 30 tablet, Rfl: 0 .  torsemide (DEMADEX) 20 MG tablet, Take 40 mg by mouth daily., Disp: , Rfl:  .  traZODone (DESYREL) 100 MG tablet, Take 100 mg by mouth at bedtime as needed for sleep., Disp: , Rfl:  .  nicotine (NICODERM CQ) 21 mg/24hr patch, Place 1 patch (21 mg total) onto the skin daily. (Patient not taking: Reported on 10/18/2018), Disp: 28 patch, Rfl: 0 Allergies  Allergen Reactions  . Gabapentin Nausea And Vomiting and Other (See Comments)    POSSIBLE SHAKING? TAKING MED CURRENTLY  . Lyrica [Pregabalin] Other (See Comments)    Shaking Pt still taking lyrica--"probably will stop taking"       Social History   Socioeconomic History  . Marital status: Divorced    Spouse name: Not on file  . Number of children: 1  . Years of education: 24  . Highest education level: 12th grade  Occupational History  . Occupation: disabled  Social Needs  . Financial resource strain: Somewhat hard  . Food insecurity:    Worry: Never true    Inability: Never true  . Transportation needs:    Medical: No    Non-medical: No  Tobacco Use  . Smoking status: Current Some Day Smoker    Packs/day: 1.00    Years: 40.00    Pack years: 40.00    Types: E-cigarettes, Cigarettes  . Smokeless tobacco: Former Systems developer    Types: Snuff  Substance and Sexual Activity  . Alcohol use: Yes    Alcohol/week: 0.0 standard drinks  . Drug use: Yes    Types: "Crack" cocaine, Marijuana, Oxycodone    Comment: 04/29/2015 "last drug use was ~ 09/08/2013"  . Sexual activity: Not Currently  Lifestyle  . Physical  activity:    Days per week: Not on file    Minutes per session: Not on file  . Stress: Not on file  Relationships  . Social connections:    Talks on phone: Not on file    Gets together: Not on file    Attends religious service: Not on file    Active member of club or organization: Not on file    Attends meetings of clubs or organizations: Not on file    Relationship status: Not on file  . Intimate partner violence:    Fear of current or ex partner: Not on file    Emotionally abused: Not on file    Physically abused: Not on file    Forced sexual activity: Not on file  Other Topics Concern  . Not on file  Social History Narrative   Lives  in [place], in [place] with sister.  They are looking to move but don't have a place to go yet.      Physical Exam      Future Appointments  Date Time Provider [place]  12/04/2018 11:40 AM Larey Dresser, MD [facility] None    BP (!) 148/66   Pulse 62   Wt 243 lb (110.2 kg)   LMP 11/27/2012   SpO2 97%   BMI 41.71 kg/m   Weight yesterday-242 Last visit weight-241 CBG PTA-187  Pt reports not feeling well this weekend, she states her legs were swollen this wknd, she took 2 extra torsemide last night, and then an additional torsemide this morning.  She denies any increased sob, no weight change from yesterday. She reports legs feel very tight and she has some blisters on her left leg that is oozing. She does have home health nurse but not sure when she is coming out. Last time nurse came out was Saturday.  meds verified and pill box refilled.  She just took her meds approx 76min ago-b/p elevated.  She did use her inhaler this morning as well-she has  wheezing to lower lobes and upper rt side but denies increased sob. She has appoint with PCP next week. Her legs are reddened on both sides, more to the rt side, she has small bandaids on the open sores. Advised her to contact her PCP to see if she can be seen sooner due to potential  cellulitis. She will or she will call her nurse to come out to evaluate and maybe call PCP to see if he can prescribe something over the phone.   Marylouise Stacks, [place] Paramedic  11/05/18

## 2018-11-09 ENCOUNTER — Encounter: Payer: Self-pay | Admitting: *Deleted

## 2018-11-12 ENCOUNTER — Telehealth (HOSPITAL_COMMUNITY): Payer: Self-pay

## 2018-11-12 NOTE — Telephone Encounter (Signed)
Called pt to sch home visit tomor, she is going to fill her own pill box and I will check tomor. She is comfortable doing that and will follow up tomor.   Marylouise Stacks, EMT-Paramedic  11/12/18

## 2018-11-15 ENCOUNTER — Other Ambulatory Visit (HOSPITAL_COMMUNITY): Payer: Self-pay

## 2018-11-15 NOTE — Progress Notes (Signed)
Paramedicine Encounter    Patient ID: [name], female    DOB: Feb 23, 1961, 58 y.o.   MRN: [id number]   Patient Care Team: Sandi Mariscal, MD as PCP - General (Internal Medicine) [name], MD as PCP - Cardiology (Cardiology) [name], MD as Consulting Physician (Cardiology) [name], MD as Consulting Physician (Pulmonary Disease) Jorge Ny, LCSW as Social Worker (Licensed Clinical Social Worker)  Patient Active Problem List   Diagnosis Date Noted  . Acute respiratory failure ([place]) 10/21/2018  . AKI (acute kidney injury) ([place]) 08/18/2018  . Coagulation disorder ([place]) 08/09/2017  . Depression 07/21/2017  . At risk for adverse drug reaction 06/20/2017  . Peripheral neuropathy 06/20/2017  . Acute osteomyelitis of right foot ([place]) 06/13/2017  . S/P transmetatarsal amputation of foot, right ([place]) 06/05/2017  . 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 PAD (peripheral artery disease) ([place])   . SIRS (systemic inflammatory response syndrome) ([place]) 04/06/2017  . CKD (chronic kidney disease), stage III ([place]) 11/24/2016  . Suspected sleep apnea 11/24/2016  . Severe obesity (BMI >= 40) ([place]) 02/24/2016  . COPD GOLD 0  02/24/2016  . Chronic systolic CHF (congestive heart failure) ([place]) 02/12/2016  . Encounter for therapeutic drug monitoring 02/10/2016  . Symptomatic bradycardia 01/12/2016  . Essential hypertension 12/22/2015  . Wheeze   . 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 Tobacco abuse 10/23/2015  . Coronary artery disease   . DOE (dyspnea on exertion) 04/29/2015  . PAF (paroxysmal atrial fibrillation) ([place]) 01/16/2015  . Carotid arterial disease ([place]) 01/16/2015  . Claudication ([place]) 01/15/2015  . Demand ischemia ([place]) 10/29/2014  . Insomnia 02/03/2014  . S/P peripheral artery angioplasty - TurboHawk atherectomy; R SFA 09/11/2013    Class: Acute  . Leg pain, bilateral 08/19/2013  .  Hypothyroidism 07/31/2013  . Cellulitis 06/13/2013  . History of cocaine abuse ([place]) 06/13/2013  . Long term current use of anticoagulant therapy 05/20/2013  . Alcohol abuse   . Narcotic abuse ([place])   . Marijuana abuse   . Alcoholic cirrhosis ([place])   . DM (diabetes mellitus), type 2 with peripheral vascular complications ([name])     Current Outpatient Medications:  .  albuterol (PROVENTIL HFA;VENTOLIN HFA) 108 (90 Base) MCG/ACT inhaler, Inhale 2 puffs into the lungs every 6 (six) hours as needed for wheezing or shortness of breath., Disp: 18 g, Rfl: 0 .  amiodarone (PACERONE) 100 MG tablet, TAKE 1 TABLET (100 MG TOTAL) BY MOUTH DAILY (Patient taking differently: Take 100 mg by mouth daily. ), Disp: 30 tablet, Rfl: 5 .  amLODipine (NORVASC) 10 MG tablet, Take 1 tablet (10 mg total) by mouth daily., Disp: 30 tablet, Rfl: 5 .  apixaban (ELIQUIS) 5 MG TABS tablet, Take 1 tablet (5 mg total) by mouth 2 (two) times daily., Disp: 60 tablet, Rfl: 0 .  atorvastatin (LIPITOR) 40 MG tablet, TAKE 1 TABLET BY MOUTH ONCE EVERY EVENING (Patient taking differently: Take 40 mg by mouth daily. TAKE 1 TABLET BY MOUTH ONCE EVERY EVENING), Disp: 30 tablet, Rfl: 0 .  bisoprolol (ZEBETA) 10 MG tablet, Take 1 tablet (10 mg total) by mouth daily., Disp: 30 tablet, Rfl: 11 .  cholecalciferol (VITAMIN D) 1000 units tablet, Take 1,000 Units by mouth daily. , Disp: , Rfl:  .  folic acid (FOLVITE) 1 MG tablet, Take 1 tablet (1 mg total) by mouth daily. (Patient taking differently: Take 1 mg by mouth daily. ),  Disp: 30 tablet, Rfl: 0 .  insulin aspart (NOVOLOG) 100 UNIT/ML injection, Inject 30 Units into the skin 3 (three) times daily before meals., Disp: 30 mL, Rfl: 0 .  Insulin Glargine (LANTUS SOLOSTAR) 100 UNIT/ML Solostar Pen, Inject 72 Units into the skin at bedtime., Disp: , Rfl:  .  levothyroxine (SYNTHROID, LEVOTHROID) 50 MCG tablet, Take 1 tablet (50 mcg total) by mouth daily., Disp: 30 tablet, Rfl: 0 .  losartan  (COZAAR) 100 MG tablet, Take 1 tablet (100 mg total) by mouth daily. (Patient taking differently: Take 100 mg by mouth at bedtime. ), Disp: 30 tablet, Rfl: 5 .  Multiple Vitamin (MULTIVITAMIN WITH MINERALS) TABS tablet, Take 1 tablet by mouth daily., Disp: 30 tablet, Rfl: 0 .  thiamine 100 MG tablet, Take 1 tablet (100 mg total) by mouth daily. (Patient taking differently: Take 100 mg by mouth daily. ), Disp: 30 tablet, Rfl: 0 .  torsemide (DEMADEX) 20 MG tablet, Take 40 mg by mouth daily., Disp: , Rfl:  .  nicotine (NICODERM CQ) 21 mg/24hr patch, Place 1 patch (21 mg total) onto the skin daily. (Patient not taking: Reported on 10/18/2018), Disp: 28 patch, Rfl: 0 .  traZODone (DESYREL) 100 MG tablet, Take 100 mg by mouth at bedtime as needed for sleep., Disp: , Rfl:  Allergies  Allergen Reactions  . Gabapentin Nausea And Vomiting and Other (See Comments)    POSSIBLE SHAKING? TAKING MED CURRENTLY  . Lyrica [Pregabalin] Other (See Comments)    Shaking Pt still taking lyrica--"probably will stop taking"       Social History   Socioeconomic History  . Marital status: Divorced    Spouse name: Not on file  . Number of children: 1  . Years of education: 55  . Highest education level: 12th grade  Occupational History  . Occupation: disabled  Social Needs  . Financial resource strain: Somewhat hard  . Food insecurity:    Worry: Never true    Inability: Never true  . Transportation needs:    Medical: No    Non-medical: No  Tobacco Use  . Smoking status: Current Some Day Smoker    Packs/day: 1.00    Years: 40.00    Pack years: 40.00    Types: E-cigarettes, Cigarettes  . Smokeless tobacco: Former Systems developer    Types: Snuff  Substance and Sexual Activity  . Alcohol use: Yes    Alcohol/week: 0.0 standard drinks  . Drug use: Yes    Types: "Crack" cocaine, Marijuana, Oxycodone    Comment: 04/29/2015 "last drug use was ~ 09/08/2013"  . Sexual activity: Not Currently  Lifestyle  . Physical  activity:    Days per week: Not on file    Minutes per session: Not on file  . Stress: Not on file  Relationships  . Social connections:    Talks on phone: Not on file    Gets together: Not on file    Attends religious service: Not on file    Active member of club or organization: Not on file    Attends meetings of clubs or organizations: Not on file    Relationship status: Not on file  . Intimate partner violence:    Fear of current or ex partner: Not on file    Emotionally abused: Not on file    Physically abused: Not on file    Forced sexual activity: Not on file  Other Topics Concern  . Not on file  Social History Narrative   Lives  in [place], in [place] with sister.  They are looking to move but don't have a place to go yet.      Physical Exam      Future Appointments  Date Time Provider [place]  11/26/2018 10:00 AM Lendon Colonel, NP [facility]  12/04/2018 11:40 AM Larey Dresser, MD [facility] None    BP 130/80   Pulse 90   Resp 15   Wt 242 lb (109.8 kg)   LMP 11/27/2012   SpO2 95%   BMI 41.54 kg/m   Weight yesterday-242 Last visit weight-243 CBG PTA-91  Pt reports she woke up this morning feeling flushed and hot,  she has some audible wheezing, she reports that she has been doing a lot of running around and she got wheezing but she took her inhaler right before I arrived. She has productive cough of yellow sputum. She goes to PCP on 1/9.  She has home health nurse coming today as well. She arrived during my visit as well. She had fall a few days ago, she tripped over her shoe--her shoe came off of the foot that she has had some toes removed and she tripped over it.  She c/o pain to her feet, this is chronic pain.  Pt did not fill pill box but has been taking meds from bottle.  Pt did go to PCP last week about possible cellulitis, he reported to her it was not that and to keep an eye on it and if it worsens then to come back.  Her rt leg  looks more swollen and it is leaking to shin area. It is reddened but not hotter than rest of her leg.  At last visit it was overlooked that she missed a cardio appoint with dr berry office-we called to get that resch and I advised her to be sure to get addressed the need of her plavix.  We got it resch for 1/13 @ 10 @ [facility].  She has productive cough of white phlegm. Her lung sounds have improved to minimal wheezing--she has been at rest. It gets worse when she gets up walking around.  meds verified and pill box refilled.  Called in for refills for thiamine, folic acid and multivitamin however he needs to fax over to PCP for that as she got those prescribed while she was in hosp.  She will call [employer] for transportation services for her appoint.  Home health advised this was her last visit as no more is needed. Her wounds have healed. Pt reports that a guy from [facility] came a few days ago for PT, didn't say when he would be back if he was coming back-home health nurse was not aware of any of these services, however with this being her last visit not sure if PT would need to continue.   Marylouise Stacks, [place] [facility] Paramedic  11/15/18

## 2018-11-22 ENCOUNTER — Other Ambulatory Visit (HOSPITAL_COMMUNITY): Payer: Self-pay

## 2018-11-22 IMAGING — MG DIGITAL SCREENING BILATERAL MAMMOGRAM WITH TOMO AND CAD
6 of 10 series · 6 of 30 positions shown · non-contrast
Comparison: Previous exam(s).

CLINICAL DATA: Screening.

EXAM:
DIGITAL SCREENING BILATERAL MAMMOGRAM WITH TOMO AND CAD

[R MLO synth-2D]
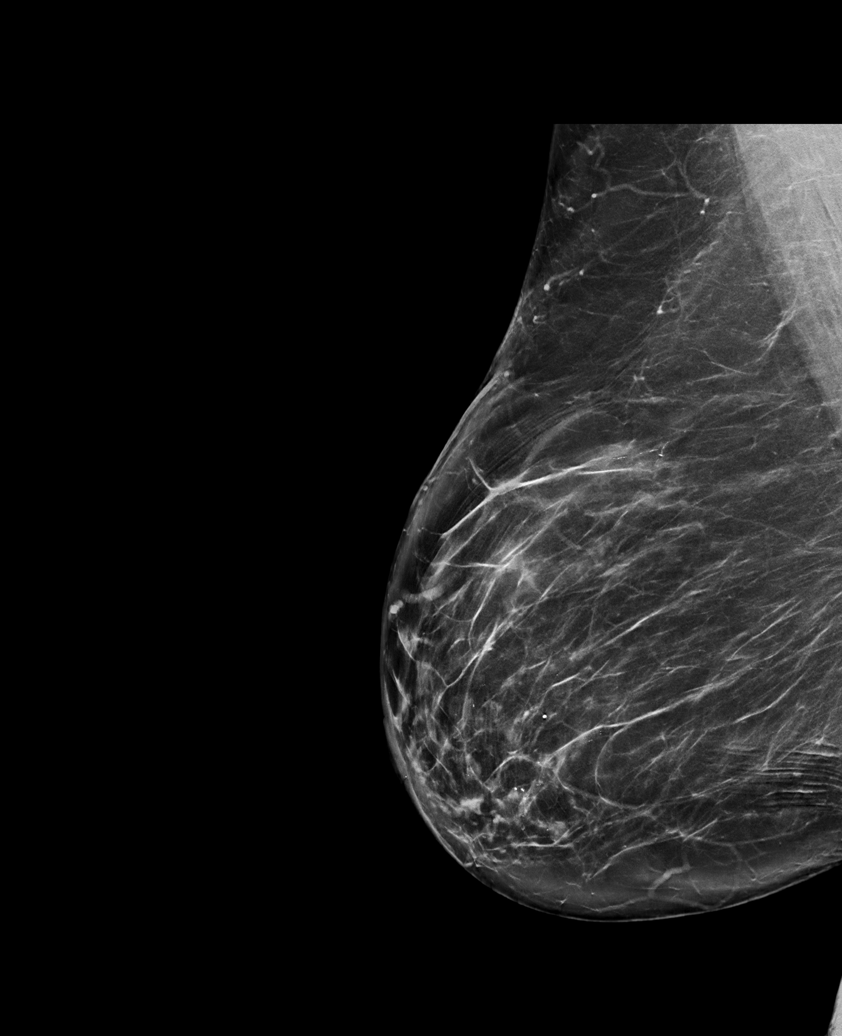

[L CC synth-2D]
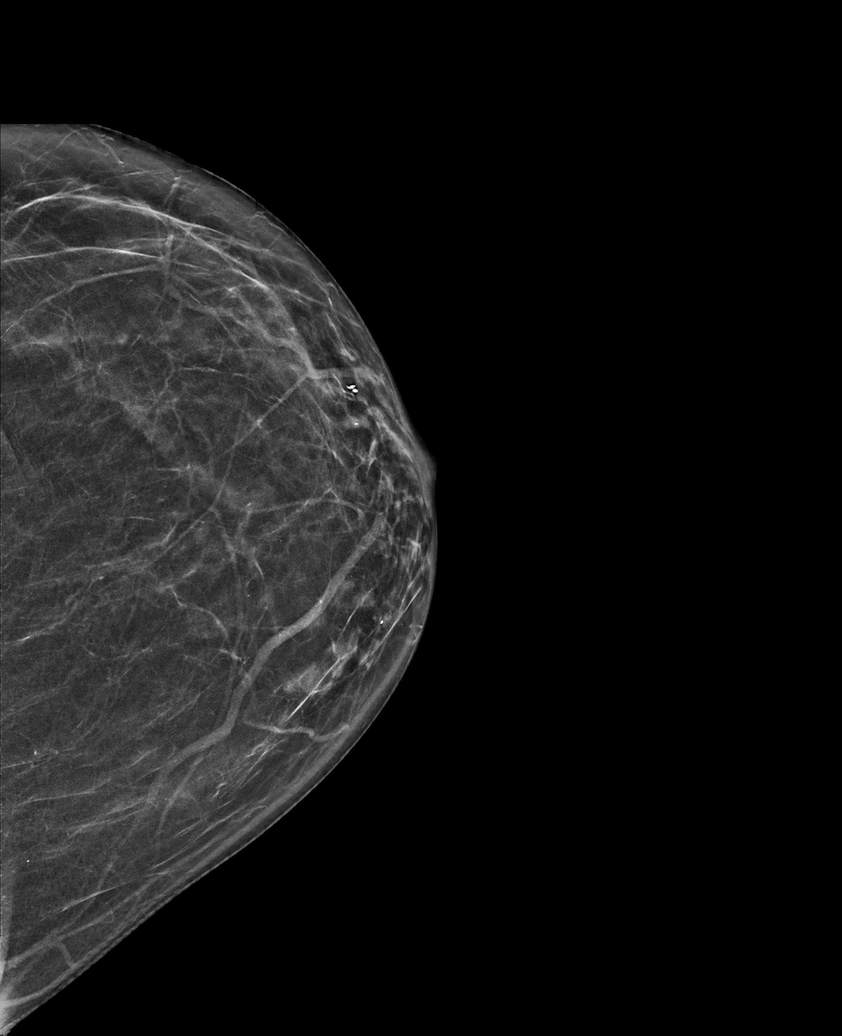

[R CC synth-2D (1 of 2)]
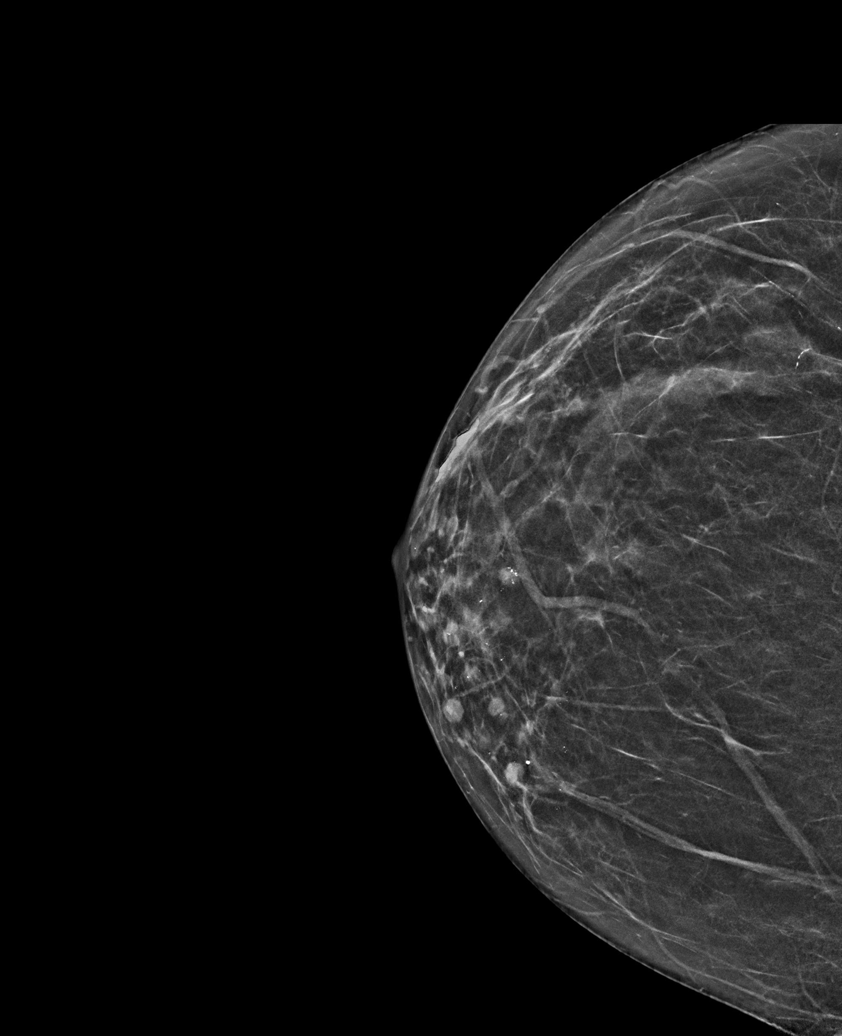

[R CC synth-2D (2 of 2)]
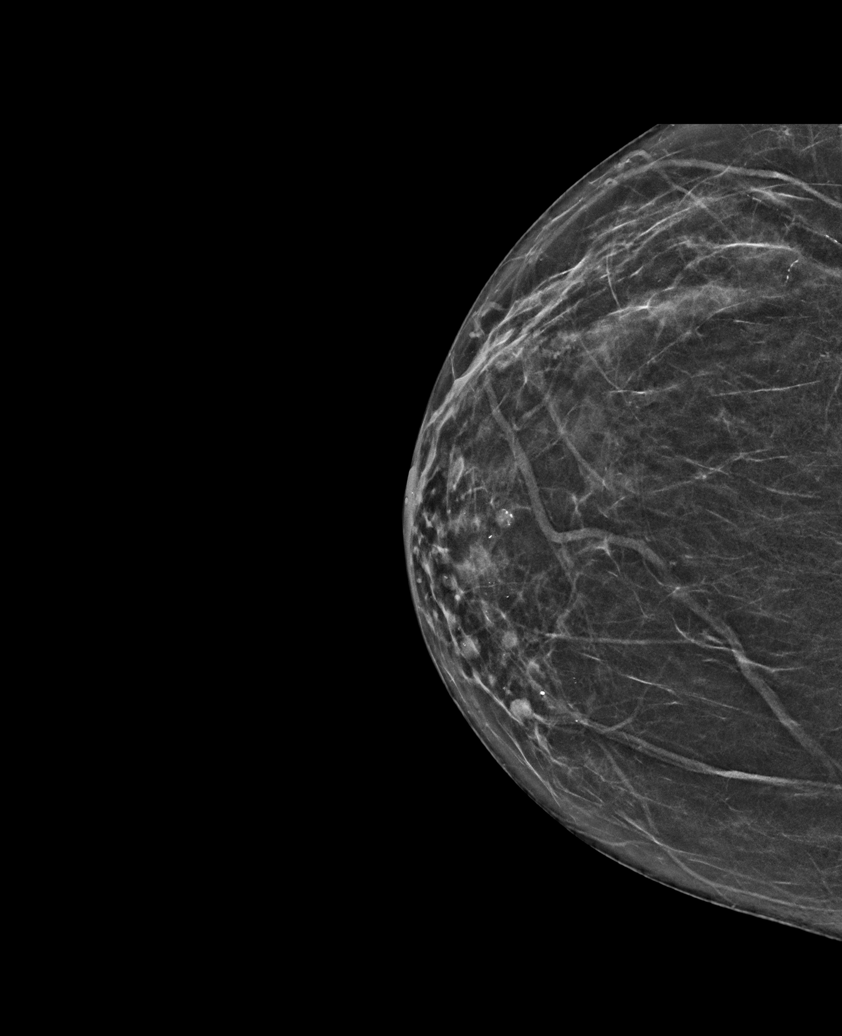

[L MLO synth-2D]
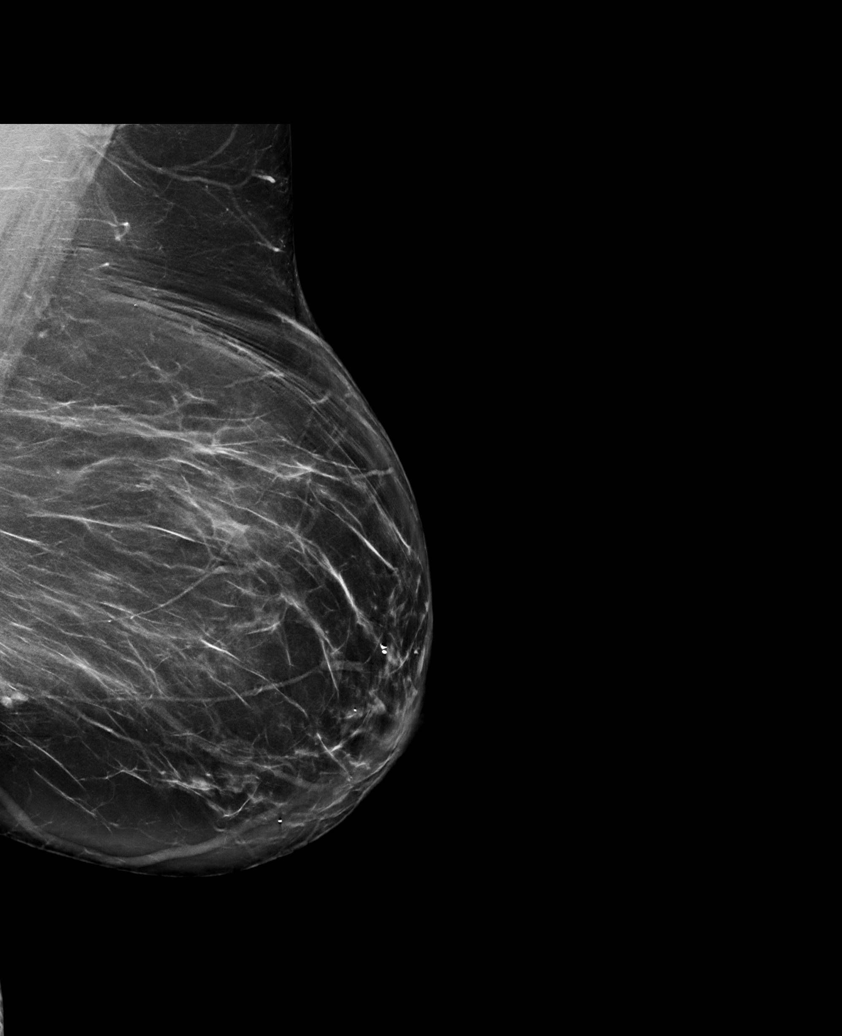

[L MLO tomo · tomo slice 43/84.0]
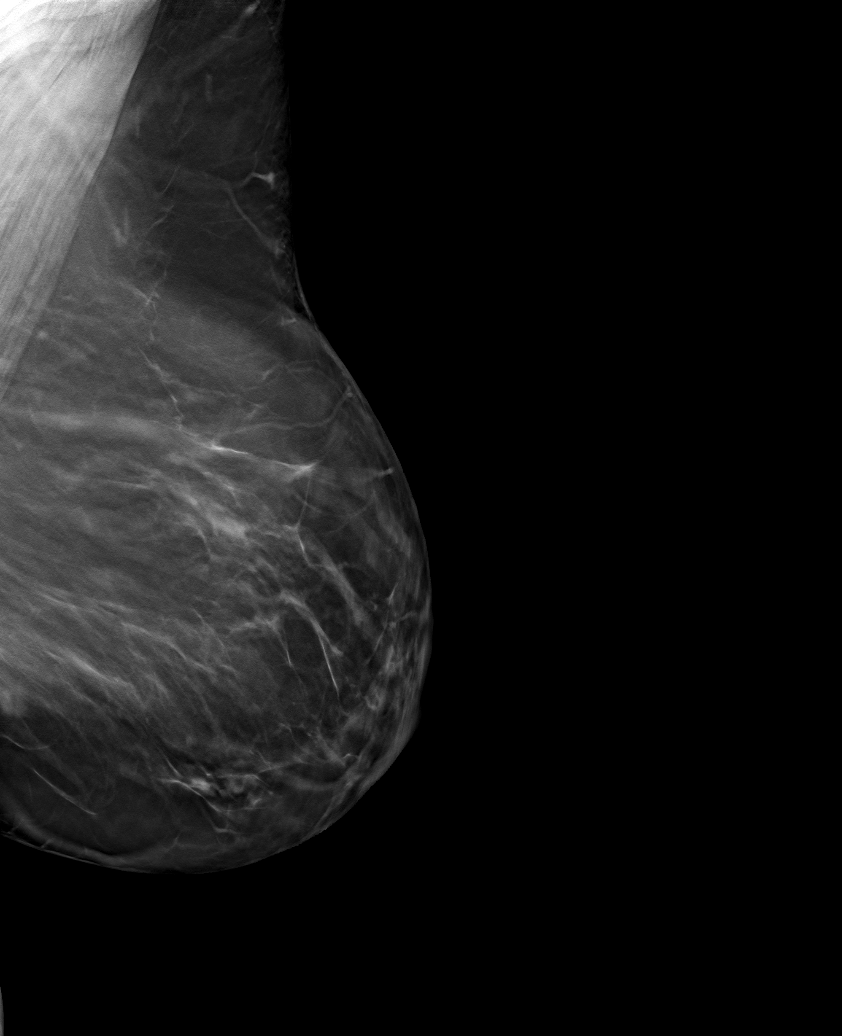

[6 of 30 positions shown; findings below may reference images not displayed]

ACR Breast Density Category b: There are scattered areas of
fibroglandular density.
FINDINGS: There are no findings suspicious for malignancy. Images were
processed with CAD.
IMPRESSION: No mammographic evidence of malignancy. A result letter of this
screening mammogram will be mailed directly to the patient.

RECOMMENDATION:
Screening mammogram in one year. (Code:[id number])

BI-RADS CATEGORY  1: Negative.

## 2018-11-22 NOTE — Progress Notes (Signed)
Paramedicine Encounter    Patient ID: [name], female    DOB: 1961/07/30, 58 y.o.   MRN: [id number]   Patient Care Team: Sandi Mariscal, MD as PCP - General (Internal Medicine) [name], MD as PCP - Cardiology (Cardiology) [name], MD as Consulting Physician (Cardiology) [name], MD as Consulting Physician (Pulmonary Disease) Jorge Ny, LCSW as Social Worker (Licensed Clinical Social Worker)  Patient Active Problem List   Diagnosis Date Noted  . Acute respiratory failure ([place]) 10/21/2018  . AKI (acute kidney injury) ([place]) 08/18/2018  . Coagulation disorder ([place]) 08/09/2017  . Depression 07/21/2017  . At risk for adverse drug reaction 06/20/2017  . Peripheral neuropathy 06/20/2017  . Acute osteomyelitis of right foot ([place]) 06/13/2017  . S/P transmetatarsal amputation of foot, right ([place]) 06/05/2017  . 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 ([place]) 05/19/2017  . PAD (peripheral artery disease) ([place])   . SIRS (systemic inflammatory response syndrome) ([place]) 04/06/2017  . CKD (chronic kidney disease), stage III ([place]) 11/24/2016  . Suspected sleep apnea 11/24/2016  . Severe obesity (BMI >= 40) ([place]) 02/24/2016  . COPD GOLD 0  02/24/2016  . Chronic systolic CHF (congestive heart failure) ([place]) 02/12/2016  . Encounter for therapeutic drug monitoring 02/10/2016  . Symptomatic bradycardia 01/12/2016  . Essential hypertension 12/22/2015  . Wheeze   . 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 Tobacco abuse 10/23/2015  . Coronary artery disease   . DOE (dyspnea on exertion) 04/29/2015  . PAF (paroxysmal atrial fibrillation) ([place]) 01/16/2015  . Carotid arterial disease ([place]) 01/16/2015  . Claudication ([place]) 01/15/2015  . Demand ischemia ([place]) 10/29/2014  . Insomnia 02/03/2014  . S/P peripheral artery angioplasty - TurboHawk atherectomy; R SFA 09/11/2013    Class: Acute  . Leg pain, bilateral 08/19/2013  .  Hypothyroidism 07/31/2013  . Cellulitis 06/13/2013  . History of cocaine abuse ([place]) 06/13/2013  . Long term current use of anticoagulant therapy 05/20/2013  . Alcohol abuse   . Narcotic abuse ([place])   . Marijuana abuse   . Alcoholic cirrhosis ([place])   . DM (diabetes mellitus), type 2 with peripheral vascular complications ([name])     Current Outpatient Medications:  .  albuterol (PROVENTIL HFA;VENTOLIN HFA) 108 (90 Base) MCG/ACT inhaler, Inhale 2 puffs into the lungs every 6 (six) hours as needed for wheezing or shortness of breath., Disp: 18 g, Rfl: 0 .  amiodarone (PACERONE) 100 MG tablet, TAKE 1 TABLET (100 MG TOTAL) BY MOUTH DAILY (Patient taking differently: Take 100 mg by mouth daily. ), Disp: 30 tablet, Rfl: 5 .  amLODipine (NORVASC) 10 MG tablet, Take 1 tablet (10 mg total) by mouth daily., Disp: 30 tablet, Rfl: 5 .  apixaban (ELIQUIS) 5 MG TABS tablet, Take 1 tablet (5 mg total) by mouth 2 (two) times daily., Disp: 60 tablet, Rfl: 0 .  atorvastatin (LIPITOR) 40 MG tablet, TAKE 1 TABLET BY MOUTH ONCE EVERY EVENING (Patient taking differently: Take 40 mg by mouth daily. TAKE 1 TABLET BY MOUTH ONCE EVERY EVENING), Disp: 30 tablet, Rfl: 0 .  bisoprolol (ZEBETA) 10 MG tablet, Take 1 tablet (10 mg total) by mouth daily., Disp: 30 tablet, Rfl: 11 .  cholecalciferol (VITAMIN D) 1000 units tablet, Take 1,000 Units by mouth daily. , Disp: , Rfl:  .  folic acid (FOLVITE) 1 MG tablet, Take 1 tablet (1 mg total) by mouth daily. (Patient taking differently: Take 1 mg by mouth daily. ),  Disp: 30 tablet, Rfl: 0 .  insulin aspart (NOVOLOG) 100 UNIT/ML injection, Inject 30 Units into the skin 3 (three) times daily before meals., Disp: 30 mL, Rfl: 0 .  Insulin Glargine (LANTUS SOLOSTAR) 100 UNIT/ML Solostar Pen, Inject 72 Units into the skin at bedtime., Disp: , Rfl:  .  levothyroxine (SYNTHROID, LEVOTHROID) 50 MCG tablet, Take 1 tablet (50 mcg total) by mouth daily., Disp: 30 tablet, Rfl: 0 .  losartan  (COZAAR) 100 MG tablet, Take 1 tablet (100 mg total) by mouth daily. (Patient taking differently: Take 100 mg by mouth at bedtime. ), Disp: 30 tablet, Rfl: 5 .  Multiple Vitamin (MULTIVITAMIN WITH MINERALS) TABS tablet, Take 1 tablet by mouth daily., Disp: 30 tablet, Rfl: 0 .  thiamine 100 MG tablet, Take 1 tablet (100 mg total) by mouth daily. (Patient taking differently: Take 100 mg by mouth daily. ), Disp: 30 tablet, Rfl: 0 .  torsemide (DEMADEX) 20 MG tablet, Take 40 mg by mouth daily., Disp: , Rfl:  .  traZODone (DESYREL) 100 MG tablet, Take 100 mg by mouth at bedtime as needed for sleep., Disp: , Rfl:  .  nicotine (NICODERM CQ) 21 mg/24hr patch, Place 1 patch (21 mg total) onto the skin daily. (Patient not taking: Reported on 10/18/2018), Disp: 28 patch, Rfl: 0 Allergies  Allergen Reactions  . Gabapentin Nausea And Vomiting and Other (See Comments)    POSSIBLE SHAKING? TAKING MED CURRENTLY  . Lyrica [Pregabalin] Other (See Comments)    Shaking Pt still taking lyrica--"probably will stop taking"       Social History   Socioeconomic History  . Marital status: Divorced    Spouse name: Not on file  . Number of children: 1  . Years of education: 85  . Highest education level: 12th grade  Occupational History  . Occupation: disabled  Social Needs  . Financial resource strain: Somewhat hard  . Food insecurity:    Worry: Never true    Inability: Never true  . Transportation needs:    Medical: No    Non-medical: No  Tobacco Use  . Smoking status: Current Some Day Smoker    Packs/day: 1.00    Years: 40.00    Pack years: 40.00    Types: E-cigarettes, Cigarettes  . Smokeless tobacco: Former Systems developer    Types: Snuff  Substance and Sexual Activity  . Alcohol use: Yes    Alcohol/week: 0.0 standard drinks  . Drug use: Yes    Types: "Crack" cocaine, Marijuana, Oxycodone    Comment: 04/29/2015 "last drug use was ~ 09/08/2013"  . Sexual activity: Not Currently  Lifestyle  . Physical  activity:    Days per week: Not on file    Minutes per session: Not on file  . Stress: Not on file  Relationships  . Social connections:    Talks on phone: Not on file    Gets together: Not on file    Attends religious service: Not on file    Active member of club or organization: Not on file    Attends meetings of clubs or organizations: Not on file    Relationship status: Not on file  . Intimate partner violence:    Fear of current or ex partner: Not on file    Emotionally abused: Not on file    Physically abused: Not on file    Forced sexual activity: Not on file  Other Topics Concern  . Not on file  Social History Narrative   Lives  in [place], in [place] with sister.  They are looking to move but don't have a place to go yet.      Physical Exam      Future Appointments  Date Time Provider [place]  11/26/2018 10:00 AM Lendon Colonel, NP [facility]  12/04/2018 11:40 AM Larey Dresser, MD [facility] None  12/17/2018  9:00 AM Edrick Kins, DPM [facility]    BP 132/70   Pulse 68   Resp 18   Wt 242 lb (109.8 kg)   LMP 11/27/2012   SpO2 96%   BMI 41.54 kg/m   Weight yesterday-243 Last visit weight-242 CBG PTA-320  Pt reports not feeling good at all today, she feels like her whole body feels like a toothache. She reports she got up feeling like this today, she went to sleep yesterday evening at 6, slept til 12 and was up the rest of the night. She had some audible wheezing when I arrived, she used her inhaler, after a couple minutes her wheezing decreased, this is a rushed visit due to her transportation is on the way for her PCP visit. Still smoking. She reports her nicotine patches wasn't covered under insurance. She still has a productive cough of white phlegm. I advised her to be sure to relay all this info to her PCP, also asked her to get her PCP to write down any med changes since they are not in the Epic system.  Missed 2 pm doses of her  meds, meds verified and pill box refilled.   Marylouise Stacks, [facility] Paramedic  11/22/18

## 2018-11-25 NOTE — Progress Notes (Signed)
Cardiology Office Note   Date:  11/25/2018   ID:  [name], DOB [birth year], MRN [id number]  PCP:  Sandi Mariscal, MD  Cardiologist: Dr. Johnsie Cancel Vascular Disease:  Dr.Berry  Heart Failure: Dr. Aundra Dubin  No chief complaint on file.    History of Present Illness: [name] is a 58 y.o. female who presents for ongoing assessment and management of of PAD with hx of critical limb ischemia with occluded SFA and popliteal arteries bilaterally with tibial vessel disease, NICM, PAF, HTN, with other history of prior tobacco abuse, obesity, and Type II diabetes.   LE angiography in 2014 a TurboHawk directional atherectomy of the entire right SFA with an excellent SFA with excellent angiographic result. Cardiac cath was completed on 10/31/2014 revealed an EF of 30%-35% . Carotid doppler study revealed high-grade bilateral ICA disease. Angiography on 01/14/2015 revealing am occluded right internal carotid artery.  S/P left carotid endarterectomy 02/2015.   Dopplers performed 12/22/15 revealed a right TBI 0. 17. She was admitted to the hospital with altered mental status and sepsis 04/06/17 for one week. 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 I suspect performed angiography of her 04/13/17 revealing an occluded right SFA was 0 vessel runoff. Atherectomy I's are entire right SFA and she subsequently underwent right second ray resection by Dr. Sharol Given to on 04/28/17.She has stopped smoking since that hospitalization. She also underwent a right transmetatarsal amputation by Dr.Dudawhich has subsequently healed. Recent lower extremity Dopplers performed 05/29/17 revealed a right ABI 0.65 with a patent right SFA and a left ABI 0.31.Follow up arteriogram was cancelled due to elevated renal function per last noted by Dr. Gwenlyn Found in 03/2018.   She was seen by Darrick Grinder, NP on 09/03/2018  who documented admission 10/5 through 08/19/2018 with alcohol intoxication and passed out in the parking lot. EMS was called. She  did not have any fractures. Torsemide, spironolactone, metolazone was stopped due to AKI.  She reported that she was not active due to leg pain. She continues to smoke. She was increased on bisoprolol to 10 mg daily torsemide, Eliquis, and amiodarone  were continued . She continued to have intermittent claudication symptoms but not a candidate for Pletal with CHF.    Unfortunately she was admitted to [facility] for acute respiratory failure, worsening LEE, uncontrolled diabetes and mild cellulitis. Echo completed on admission 45%-50%. She was treated with doxycycline for cellulitis.  She did not have any changes in her cardiac medications.  Discharge weight 242 lbs. She is here today with significant volume overload, having gained 30 lbs. She has not been compliant with low salt diet, she continues to drink beer and smoke heavily.   She has had significant LEE and coughing with additional weeping in her right lower extremity with self reported  " green/yellow pus" coming out of the wounds. She is dressing it herself by placing gauze and tape from left over from prior dressing supplies. She lives with her cousin who she depends on for care. The cousin states that she is eating a lot of potato chips and drinks several Diet Dr. Samson Frederic a day. She also drinks a 40 oz beer a couple of times a week.   She is here having gained approximately 30 lbs. Worsening cough and congestion. She sleeps on 2-3 pillows, is very sedentary, and cousin is not sure if she is taking her medications as directed, as the cousin finds pills on the floor of her room.  Past Medical History:  Diagnosis Date  . Alcohol abuse   . Alcoholic cirrhosis ([place])   . Anemia   . Anxiety   . B12 deficiency   . CAD (coronary artery disease)    a. cath 11/10/2014 LM nl, LAD min irregs, D1 30 ost, D2 50d, LCX 73m, OM1 80 p/m (1.5 mm vessel), OM2 91m, RCA nondom 75m-->med rx.. Demand ischemia in the setting of rapid a-fib.  . Cardiomyopathy ([place])     . Carotid artery disease ([place])    a. 01/2015 Carotid Angio: RICA 824, LICA 23N. s/p L carotid endarterectomy 02/2015.  . Cellulitis    lower extremities  . Chronic combined systolic and diastolic CHF (congestive heart failure) ([place])    a. Last echo 12/205: EF 40-45%, inferoapical/posterior HK, not technically sufficient to allow eval of LV diastolic function, normal LA in size..  . Cocaine abuse ([place])   . COPD (chronic obstructive pulmonary disease) ([place])   . Critical lower limb ischemia   . Depression   . Diabetic peripheral neuropathy ([place])   . Elevated troponin    a. Chronic elevation.  [name] GERD (gastroesophageal reflux disease)   . Hyperlipemia   . Hypertension   . Hypokalemia   . Hypomagnesemia   . Hypothyroidism   . Marijuana abuse   . Narcotic abuse ([place])   . Noncompliance   . NSVT (nonsustained ventricular tachycardia) ([place])   . Obesity   . PAF (paroxysmal atrial fibrillation) ([place])   . Paroxysmal atrial tachycardia ([place])   . Peripheral arterial disease ([place])    a. 01/2015 Angio/PTA: LSFA 100 w/ recon @ adductor canal and 1 vessel runoff via AT, RSFA 99 (atherectomy/pta) - 1 vessel runoff via diff dzs peroneal.  . Poorly controlled type 2 diabetes mellitus ([place])   . Renal insufficiency    a. 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  [name] Symptomatic bradycardia    avoid AV blocking agent per EP  . Tobacco abuse     Past Surgical History:  Procedure Laterality Date  . AMPUTATION Right 06/14/2017   Procedure: Right foot transmetatarsal amputation;  Surgeon: Newt Minion, MD;  Location: [place];  Service: Orthopedics;  Laterality: Right;  . AMPUTATION TOE Right 04/28/2017   Procedure: AMPUTATION OF RIGHT SECOND RAY;  Surgeon: Newt Minion, MD;  Location: [name];  Service: Orthopedics;  Laterality: Right;  . BALLOON ANGIOPLASTY, ARTERY Right 01/15/2015   SFA/notes 01/15/2015  . CARDIAC CATHETERIZATION    . CARDIAC CATHETERIZATION N/A 01/12/2016   Procedure: Temporary Wire;  Surgeon: Minus Breeding,  MD;  Location: [place] CV LAB;  Service: Cardiovascular;  Laterality: N/A;  . CARDIOVERSION  ~ 02/2013   "twice"   . CAROTID ANGIOGRAM N/A 01/15/2015   Procedure: CAROTID ANGIOGRAM;  Surgeon: [name], MD;  Location: [facility] CATH LAB;  Service: Cardiovascular;  Laterality: N/A;  . DILATION AND CURETTAGE OF UTERUS  1988  . ENDARTERECTOMY Left 02/19/2015   Procedure: LEFT CAROTID ENDARTERECTOMY ;  Surgeon: Serafina Mitchell, MD;  Location: [place];  Service: Vascular;  Laterality: Left;  . LEFT HEART CATHETERIZATION WITH CORONARY ANGIOGRAM N/A 10/31/2014   Procedure: LEFT HEART CATHETERIZATION WITH CORONARY ANGIOGRAM;  Surgeon: Burnell Blanks, MD;  Location: [facility] CATH LAB;  Service: Cardiovascular;  Laterality: N/A;  . LOWER EXTREMITY ANGIOGRAM N/A 09/10/2013   Procedure: LOWER EXTREMITY ANGIOGRAM;  Surgeon: [name], MD;  Location[facility] C[facility] LAB;  Service: Cardiovascular;  Laterality: N/A;  . LOWER EXTREMITY ANGIOGRAM N/A 01/15/2015   Procedure:  LOWER EXTREMITY ANGIOGRAM;  Surgeon: [name], MD;  Location: [facility] CATH LAB;  Service: Cardiovascular;  Laterality: N/A;  . LOWER EXTREMITY ANGIOGRAPHY N/A 04/13/2017   Procedure: Lower Extremity Angiography;  Surgeon: [name], MD;  Location: [place] CV LAB;  Service: Cardiovascular;  Laterality: N/A;  . PERIPHERAL ATHRECTOMY Right 01/15/2015   SFA/notes 01/15/2015  . PERIPHERAL VASCULAR INTERVENTION  04/13/2017   Procedure: Peripheral Vascular Intervention;  Surgeon: [name], MD;  Location: [place] CV LAB;  Service: Cardiovascular;;     Current Outpatient Medications  Medication Sig Dispense Refill  . albuterol (PROVENTIL HFA;VENTOLIN HFA) 108 (90 Base) MCG/ACT inhaler Inhale 2 puffs into the lungs every 6 (six) hours as needed for wheezing or shortness of breath. 18 g 0  . amiodarone (PACERONE) 100 MG tablet TAKE 1 TABLET (100 MG TOTAL) BY MOUTH DAILY (Patient taking differently: Take 100 mg by mouth daily. ) 30  tablet 5  . amLODipine (NORVASC) 10 MG tablet Take 1 tablet (10 mg total) by mouth daily. 30 tablet 5  . apixaban (ELIQUIS) 5 MG TABS tablet Take 1 tablet (5 mg total) by mouth 2 (two) times daily. 60 tablet 0  . atorvastatin (LIPITOR) 40 MG tablet TAKE 1 TABLET BY MOUTH ONCE EVERY EVENING (Patient taking differently: Take 40 mg by mouth daily. TAKE 1 TABLET BY MOUTH ONCE EVERY EVENING) 30 tablet 0  . bisoprolol (ZEBETA) 10 MG tablet Take 1 tablet (10 mg total) by mouth daily. 30 tablet 11  . cholecalciferol (VITAMIN D) 1000 units tablet Take 1,000 Units by mouth daily.     . folic acid (FOLVITE) 1 MG tablet Take 1 tablet (1 mg total) by mouth daily. (Patient taking differently: Take 1 mg by mouth daily. ) 30 tablet 0  . insulin aspart (NOVOLOG) 100 UNIT/ML injection Inject 30 Units into the skin 3 (three) times daily before meals. 30 mL 0  . Insulin Glargine (LANTUS SOLOSTAR) 100 UNIT/ML Solostar Pen Inject 72 Units into the skin at bedtime.    [name] levothyroxine (SYNTHROID, LEVOTHROID) 50 MCG tablet Take 1 tablet (50 mcg total) by mouth daily. 30 tablet 0  . losartan (COZAAR) 100 MG tablet Take 1 tablet (100 mg total) by mouth daily. (Patient taking differently: Take 100 mg by mouth at bedtime. ) 30 tablet 5  . Multiple Vitamin (MULTIVITAMIN WITH MINERALS) TABS tablet Take 1 tablet by mouth daily. 30 tablet 0  . nicotine (NICODERM CQ) 21 mg/24hr patch Place 1 patch (21 mg total) onto the skin daily. (Patient not taking: Reported on 10/18/2018) 28 patch 0  . thiamine 100 MG tablet Take 1 tablet (100 mg total) by mouth daily. (Patient taking differently: Take 100 mg by mouth daily. ) 30 tablet 0  . torsemide (DEMADEX) 20 MG tablet Take 40 mg by mouth daily.    . traZODone (DESYREL) 100 MG tablet Take 100 mg by mouth at bedtime as needed for sleep.     No current facility-administered medications for this visit.     Allergies:   Gabapentin and Lyrica [pregabalin]    Social History:  The patient   reports that she has been smoking e-cigarettes and cigarettes. She has a 40.00 pack-year smoking history. She has quit using smokeless tobacco.  Her smokeless tobacco use included snuff. She reports current alcohol use. She reports current drug use. Drugs: "Crack" cocaine, Marijuana, and Oxycodone.   Family History:  The patient's family history includes Breast cancer in her mother; Cancer in her  mother; Clotting disorder in her mother; Diabetes in her mother; Emphysema in her sister; Heart attack in her mother; Heart disease in her father and mother; Hypertension in her father and mother.    ROS: All other systems are reviewed and negative. Unless otherwise mentioned in H&P    PHYSICAL EXAM: VS:  LMP 11/27/2012  , BMI There is no height or weight on file to calculate BMI. GEN: Well nourished, well developed, in no acute distress HEENT: normal Neck: no JVD, carotid bruits, or masses Cardiac: RRR; no murmurs, no S3,, rubs, or gallops, 3+ pitting edema to the knees bilaterally with erythema in the left lower extremity with tight shiny skin, right pre-tibial 2+ edema with dressing, weeping stains on dressing .   Respiratory:  Clear to auscultation bilaterally, normal work of breathing GI: soft, nontender, nondistended, + BS MS: no deformity or atrophy Skin: warm and dry, no rash Neuro:  Strength and sensation are intact Psych: euthymic mood, full affect   EKG:  NSR rate of 65 bpm. Non-specific T wave abnormality.   Recent Labs: 08/18/2018: ALT 21 10/21/2018: B Natriuretic Peptide 548.1; TSH 1.157 10/24/2018: Hemoglobin 10.4; Platelets 357 11/01/2018: BUN 31; Creatinine, Ser 1.25; Potassium 3.9; Sodium 141    Lipid Panel    Component Value Date/Time   CHOL 146 03/27/2018 1138   TRIG 222 (H) 03/27/2018 1138   HDL 39 (L) 03/27/2018 1138   CHOLHDL 3.7 03/27/2018 1138   VLDL 44 (H) 03/27/2018 1138   LDLCALC 63 03/27/2018 1138      Wt Readings from Last 3 Encounters:  11/22/18 242 lb  (109.8 kg)  11/15/18 242 lb (109.8 kg)  11/05/18 243 lb (110.2 kg)      Other studies Reviewed: Cardiac Cath   LHC 10/31/2014: Large caliber vessel with 30% mid stenosis. The first obtuse marginal branch was a small caliber vessel (1.5 mm) with diffuse 80% stenosis in the proximal and mid segment. The second obtuse marginal branch is small in caliber with 40% mid stenosis. The left posterolateral branch has mild diffuse plaque.   Echocardiogram 10/22/2018 Left ventricle: The cavity size was mildly dilated. There was   mild concentric hypertrophy. Systolic function was mildly   reduced. The estimated ejection fraction was in the range of 45%   to 50%. Wall motion was normal; there were no regional wall   motion abnormalities. Features are consistent with a pseudonormal   left ventricular filling pattern, with concomitant abnormal   relaxation and increased filling pressure (grade 2 diastolic   dysfunction). Doppler parameters are consistent with high   ventricular filling pressure. - Aortic valve: Transvalvular velocity was within the normal range.   There was no stenosis. There was no regurgitation. - Mitral valve: Transvalvular velocity was within the normal range.   There was no evidence for stenosis. There was mild regurgitation. - Left atrium: The atrium was severely dilated. - Right ventricle: The cavity size was normal. Wall thickness was   normal. Systolic function was normal. - Tricuspid valve: There was mild regurgitation. - Pulmonary arteries: Systolic pressure was moderately to severely   increased. PA peak pressure: 57 mm Hg (S).   ASSESSMENT AND PLAN:  1.  Acute on Chronic Mixed CHF: She has regained approximately 30 lbs since discharge from [facility] on 10/25/2018. She has been non-compliant with low sodium diet, reported by cousin with whom she lives, Eating high salt foods such as bags of potato chips and drinking several cans of diet drinks a day. She  may or may to be  complaint with medications.   She has been seen on site by DOD, Dr. Stanford Breed who has also examined her. She will be admitted for IV diureses, labs, and strict I/O. She will be placed on a 1 liter fluid restriction.  2. Right lower leg cellulitis: She reports "pus drainage" from her right lower extremity with prior cellulitis in December. She is dressing this herself with left over wound care supplies. I did not unwrap her leg to examine it.    3. PAD: Followed by Dr. Gwenlyn Found. Atherectomy I's are entire right SFA and she subsequently underwent right second ray resection by Dr. Sharol Given to on 04/28/17.She has stopped smoking since that hospitalization. She also underwent a right transmetatarsal amputation by Dr.Dudawhich has subsequently healed. Recent lower extremity Dopplers performed 05/29/17 revealed a right ABI 0.65 with a patent right SFA and a left ABI 0.31.Follow up arteriogram was cancelled due to elevated renal function.  4. Ongoing tobacco abuse: She has no plans to quit. She was offered Nicoderm patches but insurance did not pay for these and she states she could not afford them.  5. PAF: On Eliquis and bisoprolol, and amiodarone.   6. Uncontrolled diabetes:  To be followed by PCP. Will need sliding scale coverage if necessary.   Current medicines are reviewed at length with the patient today.    Labs/ tests ordered today include: Admission labs.   Phill Myron. West Pugh, ANP, AACC   11/25/2018 12:29 PM    [address] Office [phone] Fax [phone]

## 2018-11-26 ENCOUNTER — Other Ambulatory Visit: Payer: Self-pay

## 2018-11-26 ENCOUNTER — Encounter: Payer: Self-pay | Admitting: Adult Health

## 2018-11-26 ENCOUNTER — Encounter (HOSPITAL_COMMUNITY): Payer: Self-pay | Admitting: General Practice

## 2018-11-26 ENCOUNTER — Ambulatory Visit (INDEPENDENT_AMBULATORY_CARE_PROVIDER_SITE_OTHER): Payer: Medicare Other | Admitting: Adult Health

## 2018-11-26 ENCOUNTER — Inpatient Hospital Stay (HOSPITAL_COMMUNITY)
Admission: AD | Admit: 2018-11-26 | Discharge: 2018-12-03 | DRG: 291 | Disposition: A | Discharge status: Home Health | Payer: Medicare Other | Source: Ambulatory Visit | Attending: Cardiology | Admitting: Cardiology

## 2018-11-26 ENCOUNTER — Other Ambulatory Visit: Payer: Self-pay | Admitting: Adult Health

## 2018-11-26 ENCOUNTER — Encounter: Payer: Self-pay | Admitting: Cardiology

## 2018-11-26 VITALS — BP 122/64 | HR 67 | Ht 64.0 in | Wt 271.5 lb

## 2018-11-26 DIAGNOSIS — Z9111 Patient's noncompliance with dietary regimen: Secondary | ICD-10-CM | POA: Diagnosis not present

## 2018-11-26 DIAGNOSIS — F17210 Nicotine dependence, cigarettes, uncomplicated: Secondary | ICD-10-CM | POA: Diagnosis present

## 2018-11-26 DIAGNOSIS — N179 Acute kidney failure, unspecified: Secondary | ICD-10-CM | POA: Diagnosis present

## 2018-11-26 DIAGNOSIS — G4733 Obstructive sleep apnea (adult) (pediatric): Secondary | ICD-10-CM | POA: Diagnosis present

## 2018-11-26 DIAGNOSIS — M170 Bilateral primary osteoarthritis of knee: Secondary | ICD-10-CM | POA: Diagnosis present

## 2018-11-26 DIAGNOSIS — Z9114 Patient's other noncompliance with medication regimen: Secondary | ICD-10-CM

## 2018-11-26 DIAGNOSIS — I2510 Atherosclerotic heart disease of native coronary artery without angina pectoris: Secondary | ICD-10-CM | POA: Diagnosis present

## 2018-11-26 DIAGNOSIS — I5042 Chronic combined systolic (congestive) and diastolic (congestive) heart failure: Secondary | ICD-10-CM

## 2018-11-26 DIAGNOSIS — L97919 Non-pressure chronic ulcer of unspecified part of right lower leg with unspecified severity: Secondary | ICD-10-CM | POA: Diagnosis present

## 2018-11-26 DIAGNOSIS — Z8249 Family history of ischemic heart disease and other diseases of the circulatory system: Secondary | ICD-10-CM

## 2018-11-26 DIAGNOSIS — I5041 Acute combined systolic (congestive) and diastolic (congestive) heart failure: Secondary | ICD-10-CM | POA: Diagnosis not present

## 2018-11-26 DIAGNOSIS — F329 Major depressive disorder, single episode, unspecified: Secondary | ICD-10-CM | POA: Diagnosis present

## 2018-11-26 DIAGNOSIS — K7030 Alcoholic cirrhosis of liver without ascites: Secondary | ICD-10-CM | POA: Diagnosis present

## 2018-11-26 DIAGNOSIS — J449 Chronic obstructive pulmonary disease, unspecified: Secondary | ICD-10-CM | POA: Diagnosis present

## 2018-11-26 DIAGNOSIS — I5043 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) and diastolic (congestive) heart failure: Secondary | ICD-10-CM | POA: Diagnosis present

## 2018-11-26 DIAGNOSIS — Z803 Family history of malignant neoplasm of breast: Secondary | ICD-10-CM

## 2018-11-26 DIAGNOSIS — E1142 Type 2 diabetes mellitus with diabetic polyneuropathy: Secondary | ICD-10-CM | POA: Diagnosis present

## 2018-11-26 DIAGNOSIS — L03115 Cellulitis of right lower limb: Secondary | ICD-10-CM | POA: Diagnosis present

## 2018-11-26 DIAGNOSIS — Z7901 Long term (current) use of anticoagulants: Secondary | ICD-10-CM

## 2018-11-26 DIAGNOSIS — E538 Deficiency of other specified B group vitamins: Secondary | ICD-10-CM | POA: Diagnosis present

## 2018-11-26 DIAGNOSIS — F419 Anxiety disorder, unspecified: Secondary | ICD-10-CM | POA: Diagnosis present

## 2018-11-26 DIAGNOSIS — I130 Hypertensive heart and chronic kidney disease with heart failure and stage 1 through stage 4 chronic kidney disease, or unspecified chronic kidney disease: Principal | ICD-10-CM | POA: Diagnosis present

## 2018-11-26 DIAGNOSIS — E1122 Type 2 diabetes mellitus with diabetic chronic kidney disease: Secondary | ICD-10-CM | POA: Diagnosis present

## 2018-11-26 DIAGNOSIS — E785 Hyperlipidemia, unspecified: Secondary | ICD-10-CM | POA: Diagnosis present

## 2018-11-26 DIAGNOSIS — R0600 Dyspnea, unspecified: Secondary | ICD-10-CM

## 2018-11-26 DIAGNOSIS — I5033 Acute on chronic diastolic (congestive) heart failure: Secondary | ICD-10-CM

## 2018-11-26 DIAGNOSIS — N183 Chronic kidney disease, stage 3 (moderate): Secondary | ICD-10-CM | POA: Diagnosis present

## 2018-11-26 DIAGNOSIS — Z833 Family history of diabetes mellitus: Secondary | ICD-10-CM

## 2018-11-26 DIAGNOSIS — I878 Other specified disorders of veins: Secondary | ICD-10-CM | POA: Diagnosis present

## 2018-11-26 DIAGNOSIS — Z6841 Body Mass Index (BMI) 40.0 and over, adult: Secondary | ICD-10-CM | POA: Diagnosis not present

## 2018-11-26 DIAGNOSIS — L97929 Non-pressure chronic ulcer of unspecified part of left lower leg with unspecified severity: Secondary | ICD-10-CM | POA: Diagnosis present

## 2018-11-26 DIAGNOSIS — I480 Paroxysmal atrial fibrillation: Secondary | ICD-10-CM | POA: Diagnosis present

## 2018-11-26 DIAGNOSIS — Z832 Family history of diseases of the blood and blood-forming organs and certain disorders involving the immune mechanism: Secondary | ICD-10-CM

## 2018-11-26 DIAGNOSIS — I5023 Acute on chronic systolic (congestive) heart failure: Secondary | ICD-10-CM | POA: Diagnosis not present

## 2018-11-26 DIAGNOSIS — Z89431 Acquired absence of right foot: Secondary | ICD-10-CM

## 2018-11-26 DIAGNOSIS — Z79890 Hormone replacement therapy (postmenopausal): Secondary | ICD-10-CM

## 2018-11-26 DIAGNOSIS — F17290 Nicotine dependence, other tobacco product, uncomplicated: Secondary | ICD-10-CM | POA: Diagnosis present

## 2018-11-26 DIAGNOSIS — Z888 Allergy status to other drugs, medicaments and biological substances status: Secondary | ICD-10-CM

## 2018-11-26 DIAGNOSIS — I428 Other cardiomyopathies: Secondary | ICD-10-CM | POA: Diagnosis present

## 2018-11-26 DIAGNOSIS — Z825 Family history of asthma and other chronic lower respiratory diseases: Secondary | ICD-10-CM

## 2018-11-26 DIAGNOSIS — Z716 Tobacco abuse counseling: Secondary | ICD-10-CM | POA: Diagnosis not present

## 2018-11-26 DIAGNOSIS — E1152 Type 2 diabetes mellitus with diabetic peripheral angiopathy with gangrene: Secondary | ICD-10-CM | POA: Diagnosis present

## 2018-11-26 DIAGNOSIS — E039 Hypothyroidism, unspecified: Secondary | ICD-10-CM | POA: Diagnosis present

## 2018-11-26 DIAGNOSIS — D509 Iron deficiency anemia, unspecified: Secondary | ICD-10-CM | POA: Diagnosis present

## 2018-11-26 DIAGNOSIS — Z79899 Other long term (current) drug therapy: Secondary | ICD-10-CM

## 2018-11-26 DIAGNOSIS — Z794 Long term (current) use of insulin: Secondary | ICD-10-CM

## 2018-11-26 DIAGNOSIS — K219 Gastro-esophageal reflux disease without esophagitis: Secondary | ICD-10-CM | POA: Diagnosis present

## 2018-11-26 DIAGNOSIS — R0689 Other abnormalities of breathing: Principal | ICD-10-CM

## 2018-11-26 DIAGNOSIS — I509 Heart failure, unspecified: Secondary | ICD-10-CM

## 2018-11-26 HISTORY — DX: Sleep apnea, unspecified: G47.30

## 2018-11-26 LAB — GLUCOSE, CAPILLARY
Glucose-Capillary: 50 mg/dL — ABNORMAL LOW (ref 70–99)
Glucose-Capillary: 75 mg/dL (ref 70–99)
Glucose-Capillary: 140 mg/dL — ABNORMAL HIGH (ref 70–99)

## 2018-11-26 MED ORDER — ACETAMINOPHEN 325 MG PO TABS: 650.0000 mg | ORAL_TABLET | ORAL | Status: DC | PRN

## 2018-11-26 MED ORDER — SODIUM CHLORIDE 0.9% FLUSH: 3.0000 mL | Freq: Two times a day (BID) | INTRAVENOUS | Status: DC

## 2018-11-26 MED ORDER — TRAZODONE HCL 100 MG PO TABS
100.0000 mg | ORAL_TABLET | Freq: Every day | ORAL | Status: DC
  Administered 2018-11-26 – 2018-12-02 (×7): 100 mg via ORAL
  Filled 2018-11-26 (×7): qty 1

## 2018-11-26 MED ORDER — SODIUM CHLORIDE 0.9 % IV SOLN: 250.0000 mL | INTRAVENOUS | Status: DC | PRN

## 2018-11-26 MED ORDER — VITAMIN B-1 100 MG PO TABS
100.0000 mg | ORAL_TABLET | Freq: Every day | ORAL | Status: DC
  Administered 2018-11-27 – 2018-12-03 (×7): 100 mg via ORAL
  Filled 2018-11-26 (×7): qty 1

## 2018-11-26 MED ORDER — BISOPROLOL FUMARATE 5 MG PO TABS: 10.0000 mg | ORAL_TABLET | Freq: Every day | ORAL | Status: DC

## 2018-11-26 MED ORDER — IRBESARTAN 75 MG PO TABS: 150.0000 mg | ORAL_TABLET | Freq: Every day | ORAL | Status: DC

## 2018-11-26 MED ORDER — ATORVASTATIN CALCIUM 40 MG PO TABS
40.0000 mg | ORAL_TABLET | Freq: Every day | ORAL | Status: DC
  Administered 2018-11-26 – 2018-12-02 (×7): 40 mg via ORAL
  Filled 2018-11-26 (×7): qty 1

## 2018-11-26 MED ORDER — FUROSEMIDE 10 MG/ML IJ SOLN: 80.0000 mg | Freq: Two times a day (BID) | INTRAMUSCULAR | Status: DC

## 2018-11-26 MED ORDER — SODIUM CHLORIDE 0.9 % IV SOLN: 4.0000 mg | Freq: Four times a day (QID) | INTRAVENOUS | Status: DC | PRN

## 2018-11-26 MED ORDER — TRAZODONE 25 MG HALF TABLET: 100.0000 mg | ORAL_TABLET | Freq: Every day | ORAL | Status: DC

## 2018-11-26 MED ORDER — NICOTINE 7 MG/24HR TD PT24: 21.0000 mg | MEDICATED_PATCH | Freq: Every day | TRANSDERMAL | Status: DC

## 2018-11-26 MED ORDER — ONDANSETRON HCL 4 MG/2ML IJ SOLN: 4.0000 mg | Freq: Four times a day (QID) | INTRAMUSCULAR | Status: DC | PRN

## 2018-11-26 MED ORDER — POTASSIUM CHLORIDE CRYS ER 20 MEQ PO TBCR
20.0000 meq | EXTENDED_RELEASE_TABLET | Freq: Two times a day (BID) | ORAL | Status: DC
  Administered 2018-11-26 – 2018-11-27 (×2): 20 meq via ORAL
  Filled 2018-11-26 (×2): qty 1

## 2018-11-26 MED ORDER — ALBUTEROL SULFATE (2.5 MG/3ML) 0.083% IN NEBU
3.0000 mL | INHALATION_SOLUTION | RESPIRATORY_TRACT | Status: DC | PRN
  Administered 2018-11-26: 3 mL via RESPIRATORY_TRACT
  Filled 2018-11-26: qty 3

## 2018-11-26 MED ORDER — SODIUM CHLORIDE 0.9% FLUSH: 3.0000 mL | INTRAVENOUS | Status: DC | PRN

## 2018-11-26 MED ORDER — VITAMIN B-1 50 MG PO TABS: 100.0000 mg | ORAL_TABLET | Freq: Every day | ORAL | Status: DC

## 2018-11-26 MED ORDER — NICOTINE 21 MG/24HR TD PT24
21.0000 mg | MEDICATED_PATCH | Freq: Every day | TRANSDERMAL | Status: DC
  Administered 2018-11-27 – 2018-12-03 (×7): 21 mg via TRANSDERMAL
  Filled 2018-11-26 (×7): qty 1

## 2018-11-26 MED ORDER — ALBUTEROL SULFATE HFA 108 (90 BASE) MCG/ACT IN AERS: 2.0000 | INHALATION_SPRAY | RESPIRATORY_TRACT | Status: DC | PRN

## 2018-11-26 MED ORDER — LEVOTHYROXINE SODIUM 50 MCG PO TABS
50.0000 ug | ORAL_TABLET | Freq: Every day | ORAL | Status: DC
  Administered 2018-11-27 – 2018-12-03 (×7): 50 ug via ORAL
  Filled 2018-11-26 (×7): qty 1

## 2018-11-26 MED ORDER — SODIUM CHLORIDE 0.9% FLUSH
3.0000 mL | Freq: Two times a day (BID) | INTRAVENOUS | Status: DC
  Administered 2018-11-26 – 2018-11-27 (×3): 3 mL via INTRAVENOUS

## 2018-11-26 MED ORDER — LOSARTAN POTASSIUM 25 MG PO TABS: 100.0000 mg | ORAL_TABLET | Freq: Every day | ORAL | Status: DC

## 2018-11-26 MED ORDER — AMIODARONE HCL 100 MG PO TABS
100.0000 mg | ORAL_TABLET | Freq: Every day | ORAL | Status: DC
  Administered 2018-11-27 – 2018-12-03 (×7): 100 mg via ORAL
  Filled 2018-11-26 (×7): qty 1

## 2018-11-26 MED ORDER — LOSARTAN POTASSIUM 50 MG PO TABS
100.0000 mg | ORAL_TABLET | Freq: Every day | ORAL | Status: DC
  Administered 2018-11-27: 100 mg via ORAL
  Filled 2018-11-26 (×2): qty 2

## 2018-11-26 MED ORDER — BISOPROLOL FUMARATE 5 MG PO TABS
10.0000 mg | ORAL_TABLET | Freq: Every day | ORAL | Status: DC
  Administered 2018-11-27: 10 mg via ORAL
  Filled 2018-11-26: qty 2

## 2018-11-26 MED ORDER — INSULIN ASPART 100 UNIT/ML ~~LOC~~ SOLN: 30.0000 [IU] | Freq: Three times a day (TID) | SUBCUTANEOUS | Status: DC

## 2018-11-26 MED ORDER — INSULIN ASPART 100 UNIT/ML ~~LOC~~ SOLN
0.0000 [IU] | Freq: Three times a day (TID) | SUBCUTANEOUS | Status: DC
  Administered 2018-11-27: 4 [IU] via SUBCUTANEOUS
  Administered 2018-11-27 – 2018-11-29 (×4): 7 [IU] via SUBCUTANEOUS
  Administered 2018-11-29 (×2): 3 [IU] via SUBCUTANEOUS

## 2018-11-26 MED ORDER — SODIUM CHLORIDE 0.9% FLUSH
3.0000 mL | Freq: Two times a day (BID) | INTRAVENOUS | Status: DC
  Administered 2018-11-28 – 2018-12-02 (×6): 3 mL via INTRAVENOUS

## 2018-11-26 MED ORDER — LEVOTHYROXINE SODIUM 25 MCG PO TABS: 50.0000 ug | ORAL_TABLET | Freq: Every day | ORAL | Status: DC

## 2018-11-26 MED ORDER — POTASSIUM CHLORIDE CRYS ER 10 MEQ PO TBCR: 20.0000 meq | EXTENDED_RELEASE_TABLET | Freq: Two times a day (BID) | ORAL | Status: DC

## 2018-11-26 MED ORDER — APIXABAN 2.5 MG PO TABS: 5.0000 mg | ORAL_TABLET | Freq: Two times a day (BID) | ORAL | Status: DC

## 2018-11-26 MED ORDER — APIXABAN 5 MG PO TABS
5.0000 mg | ORAL_TABLET | Freq: Two times a day (BID) | ORAL | Status: DC
  Administered 2018-11-26 – 2018-12-03 (×14): 5 mg via ORAL
  Filled 2018-11-26 (×14): qty 1

## 2018-11-26 MED ORDER — ACETAMINOPHEN 325 MG PO TABS
650.0000 mg | ORAL_TABLET | ORAL | Status: DC | PRN
  Administered 2018-11-26 – 2018-12-03 (×17): 650 mg via ORAL
  Filled 2018-11-26 (×16): qty 2

## 2018-11-26 MED ORDER — INSULIN GLARGINE 100 UNIT/ML ~~LOC~~ SOLN: 72.0000 [IU] | Freq: Every day | SUBCUTANEOUS | Status: DC

## 2018-11-26 MED ORDER — AMLODIPINE BESYLATE 2.5 MG PO TABS: 10.0000 mg | ORAL_TABLET | Freq: Every day | ORAL | Status: DC

## 2018-11-26 MED ORDER — ATORVASTATIN CALCIUM 10 MG PO TABS: 40.0000 mg | ORAL_TABLET | Freq: Every day | ORAL | Status: DC

## 2018-11-26 MED ORDER — AMLODIPINE BESYLATE 10 MG PO TABS
10.0000 mg | ORAL_TABLET | Freq: Every day | ORAL | Status: DC
  Administered 2018-11-27 – 2018-12-02 (×6): 10 mg via ORAL
  Filled 2018-11-26 (×6): qty 1

## 2018-11-26 MED ORDER — FUROSEMIDE 10 MG/ML IJ SOLN
80.0000 mg | Freq: Two times a day (BID) | INTRAMUSCULAR | Status: DC
  Administered 2018-11-26 – 2018-11-28 (×4): 80 mg via INTRAVENOUS
  Filled 2018-11-26 (×4): qty 8

## 2018-11-26 MED ORDER — INSULIN GLARGINE 100 UNIT/ML ~~LOC~~ SOLN
72.0000 [IU] | Freq: Every day | SUBCUTANEOUS | Status: DC
  Administered 2018-11-27 – 2018-12-02 (×6): 72 [IU] via SUBCUTANEOUS
  Filled 2018-11-26 (×7): qty 0.72

## 2018-11-26 MED ORDER — AMIODARONE HCL 100 MG PO TABS: 100.0000 mg | ORAL_TABLET | Freq: Every day | ORAL | Status: DC

## 2018-11-26 MED ORDER — SODIUM CHLORIDE 0.9% FLUSH
3.0000 mL | Freq: Two times a day (BID) | INTRAVENOUS | Status: DC
  Administered 2018-11-28 – 2018-12-02 (×8): 3 mL via INTRAVENOUS

## 2018-11-26 MED ORDER — TRAMADOL HCL 50 MG PO TABS
25.0000 mg | ORAL_TABLET | Freq: Once | ORAL | Status: AC
  Administered 2018-11-27: 25 mg via ORAL
  Filled 2018-11-26: qty 1

## 2018-11-26 NOTE — Addendum Note (Signed)
Addended by: Waylan Rocher on: 11/26/2018 01:50 PM   Modules accepted: Orders

## 2018-11-26 NOTE — H&P (Addendum)
Date:  11/25/2018   ID:  [name], DOB 11/21/1960, MRN [id number]  PCP:  Sandi Mariscal, MD  Cardiologist: Dr. Johnsie Cancel Vascular Disease:  Dr.Berry  Heart Failure: Dr. Aundra Dubin   History of Present Illness: [name] is a 58 y.o. female who presents for ongoing assessment and management of of PAD with hx of critical limb ischemia with occluded SFA and popliteal arteries bilaterally with tibial vessel disease, NICM, PAF, HTN, with other history of prior tobacco abuse, obesity, and Type II diabetes.   LE angiography in 2014 a TurboHawk directional atherectomy of the entire right SFA with an excellent SFA with excellent angiographic result. Cardiac cath was completed on 10/31/2014 revealed an EF of 30%-35% . Carotid doppler study revealed high-grade bilateral ICA disease. Angiography on 01/14/2015 revealing am occluded right internal carotid artery.  S/P left carotid endarterectomy 02/2015.   Dopplers performed 12/22/15 revealed a right TBI 0. 17. She was admitted to the hospital with altered mental status and sepsis 04/06/17 for one week. 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 I suspect performed angiography of her 04/13/17 revealing an occluded right SFA was 0 vessel runoff. Atherectomy I's are entire right SFA and she subsequently underwent right second ray resection by Dr. Sharol Given to on 04/28/17.She has stopped smoking since that hospitalization. She also underwent a right transmetatarsal amputation by Dr.Dudawhich has subsequently healed. Recent lower extremity Dopplers performed 05/29/17 revealed a right ABI 0.65 with a patent right SFA and a left ABI 0.31.Follow up arteriogram was cancelled due to elevated renal function per last noted by Dr. Gwenlyn Found in 03/2018.   She was seen by Darrick Grinder, NP on 09/03/2018  who documented admission 10/5 through 08/19/2018 with alcohol intoxication and passed out in the parking lot. EMS was called. She did not have any fractures. Torsemide, spironolactone,  metolazone was stopped due to AKI.  She reported that she was not active due to leg pain. She continues to smoke. She was increased on bisoprolol to 10 mg daily torsemide, Eliquis, and amiodarone  were continued . She continued to have intermittent claudication symptoms but not a candidate for Pletal with CHF.    Unfortunately she was admitted to [facility] for acute respiratory failure, worsening LEE, uncontrolled diabetes and mild cellulitis. Echo completed on admission 45%-50%. She was treated with doxycycline for cellulitis.  She did not have any changes in her cardiac medications.  Discharge weight 242 lbs. She is here today with significant volume overload, having gained 30 lbs. She has not been compliant with low salt diet, she continues to drink beer and smoke heavily.   She has had significant LEE and coughing with additional weeping in her right lower extremity with self reported  " green/yellow pus" coming out of the wounds. She is dressing it herself by placing gauze and tape from left over from prior dressing supplies. She lives with her cousin who she depends on for care. The cousin states that she is eating a lot of potato chips and drinks several Diet Dr. Samson Frederic a day. She also drinks a 40 oz beer a couple of times a week.   She is here having gained approximately 30 lbs. Worsening cough and congestion. She sleeps on 2-3 pillows, is very sedentary, and cousin is not sure if she is taking her medications as directed, as the cousin finds pills on the floor of her room.    Past Medical History:  Diagnosis Date  . Alcohol abuse   .  Alcoholic cirrhosis ([place])   . Anemia   . Anxiety   . B12 deficiency   . CAD (coronary artery disease)    a. cath 11/10/2014 LM nl, LAD min irregs, D1 30 ost, D2 50d, LCX 88m, OM1 80 p/m (1.5 mm vessel), OM2 71m, RCA nondom 49m-->med rx.. Demand ischemia in the setting of rapid a-fib.  . Cardiomyopathy ([place])   . Carotid artery disease ([place])    a. 01/2015 Carotid  Angio: RICA 540, LICA 98J. s/p L carotid endarterectomy 02/2015.  . Cellulitis    lower extremities  . Chronic combined systolic and diastolic CHF (congestive heart failure) ([place])    a. Last echo 12/205: EF 40-45%, inferoapical/posterior HK, not technically sufficient to allow eval of LV diastolic function, normal LA in size..  . Cocaine abuse ([place])   . COPD (chronic obstructive pulmonary disease) ([place])   . Critical lower limb ischemia   . Depression   . Diabetic peripheral neuropathy ([place])   . Elevated troponin    a. Chronic elevation.  [name] GERD (gastroesophageal reflux disease)   . Hyperlipemia   . Hypertension   . Hypokalemia   . Hypomagnesemia   . Hypothyroidism   . Marijuana abuse   . Narcotic abuse ([place])   . Noncompliance   . NSVT (nonsustained ventricular tachycardia) ([place])   . Obesity   . PAF (paroxysmal atrial fibrillation) ([place])   . Paroxysmal atrial tachycardia ([place])   . Peripheral arterial disease ([place])    a. 01/2015 Angio/PTA: LSFA 100 w/ recon @ adductor canal and 1 vessel runoff via AT, RSFA 99 (atherectomy/pta) - 1 vessel runoff via diff dzs peroneal.  . Poorly controlled type 2 diabetes mellitus ([place])   . Renal insufficiency    a. 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  [name] Symptomatic bradycardia    avoid AV blocking agent per EP  . Tobacco abuse     Past Surgical History:  Procedure Laterality Date  . AMPUTATION Right 06/14/2017   Procedure: Right foot transmetatarsal amputation;  Surgeon: Newt Minion, MD;  Location: [facility];  Service: Orthopedics;  Laterality: Right;  . AMPUTATION TOE Right 04/28/2017   Procedure: AMPUTATION OF RIGHT SECOND RAY;  Surgeon: Newt Minion, MD;  Location: [facility];  Service: Orthopedics;  Laterality: Right;  . BALLOON ANGIOPLASTY, ARTERY Right 01/15/2015   SFA/notes 01/15/2015  . CARDIAC CATHETERIZATION    . CARDIAC CATHETERIZATION N/A 01/12/2016   Procedure: Temporary Wire;  Surgeon: Minus Breeding, MD;  Location: [facility];  Service:  Cardiovascular;  Laterality: N/A;  . CARDIOVERSION  ~ 02/2013   "twice"   . CAROTID ANGIOGRAM N/A 01/15/2015   Procedure: CAROTID ANGIOGRAM;  Surgeon: [name], MD;  Location[facility] CATH LAB;  Service: Cardiovascular;  Laterality: N/A;  . DILATION AND CURETTAGE OF UTERUS  1988  . ENDARTERECTOMY Left 02/19/2015   Procedure: LEFT CAROTID ENDARTERECTOMY ;  Surgeon: Serafina Mitchell, MD;  Location: [facility];  Service: Vascular;  Laterality: Left;  . LEFT HEART CATHETERIZATION WITH CORONARY ANGIOGRAM N/A 10/31/2014   Procedure: LEFT HEART CATHETERIZATION WITH CORONARY ANGIOGRAM;  Surgeon: Burnell Blanks, MD;  Location: [facility] CATH LAB;  Service: Cardiovascular;  Laterality: N/A;  . LOWER EXTREMITY ANGIOGRAM N/A 09/10/2013   Procedure: LOWER EXTREMITY ANGIOGRAM;  Surgeon: [name], MD;  Location: [facility] CATH LAB;  Service: Cardiovascular;  Laterality: N/A;  . LOWER EXTREMITY ANGIOGRAM N/A 01/15/2015   Procedure: LOWER EXTREMITY ANGIOGRAM;  Surgeon: [name], MD;  Location: [facility] CATH LAB;  Service: Cardiovascular;  Laterality: N/A;  . LOWER EXTREMITY ANGIOGRAPHY N/A 04/13/2017   Procedure: Lower Extremity Angiography;  Surgeon: [name], MD;  Location: [place] [facility];  Service: Cardiovascular;  Laterality: N/A;  . PERIPHERAL ATHRECTOMY Right 01/15/2015   SFA/notes 01/15/2015  . PERIPHERAL VASCULAR INTERVENTION  04/13/2017   Procedure: Peripheral Vascular Intervention;  Surgeon: [name], MD;  Location: [place] [facility];  Service: Cardiovascular;;     Current Outpatient Medications  Medication Sig Dispense Refill  . albuterol (PROVENTIL HFA;VENTOLIN HFA) 108 (90 Base) MCG/ACT inhaler Inhale 2 puffs into the lungs every 6 (six) hours as needed for wheezing or shortness of breath. 18 g 0  . amiodarone (PACERONE) 100 MG tablet TAKE 1 TABLET (100 MG TOTAL) BY MOUTH DAILY (Patient taking differently: Take 100 mg by mouth daily. ) 30 tablet 5  . amLODipine (NORVASC) 10 MG  tablet Take 1 tablet (10 mg total) by mouth daily. 30 tablet 5  . apixaban (ELIQUIS) 5 MG TABS tablet Take 1 tablet (5 mg total) by mouth 2 (two) times daily. 60 tablet 0  . atorvastatin (LIPITOR) 40 MG tablet TAKE 1 TABLET BY MOUTH ONCE EVERY EVENING (Patient taking differently: Take 40 mg by mouth daily. TAKE 1 TABLET BY MOUTH ONCE EVERY EVENING) 30 tablet 0  . bisoprolol (ZEBETA) 10 MG tablet Take 1 tablet (10 mg total) by mouth daily. 30 tablet 11  . cholecalciferol (VITAMIN D) 1000 units tablet Take 1,000 Units by mouth daily.     . folic acid (FOLVITE) 1 MG tablet Take 1 tablet (1 mg total) by mouth daily. (Patient taking differently: Take 1 mg by mouth daily. ) 30 tablet 0  . insulin aspart (NOVOLOG) 100 UNIT/ML injection Inject 30 Units into the skin 3 (three) times daily before meals. 30 mL 0  . Insulin Glargine (LANTUS SOLOSTAR) 100 UNIT/ML Solostar Pen Inject 72 Units into the skin at bedtime.    [name] levothyroxine (SYNTHROID, LEVOTHROID) 50 MCG tablet Take 1 tablet (50 mcg total) by mouth daily. 30 tablet 0  . losartan (COZAAR) 100 MG tablet Take 1 tablet (100 mg total) by mouth daily. (Patient taking differently: Take 100 mg by mouth at bedtime. ) 30 tablet 5  . Multiple Vitamin (MULTIVITAMIN WITH MINERALS) TABS tablet Take 1 tablet by mouth daily. 30 tablet 0  . nicotine (NICODERM CQ) 21 mg/24hr patch Place 1 patch (21 mg total) onto the skin daily. (Patient not taking: Reported on 10/18/2018) 28 patch 0  . thiamine 100 MG tablet Take 1 tablet (100 mg total) by mouth daily. (Patient taking differently: Take 100 mg by mouth daily. ) 30 tablet 0  . torsemide (DEMADEX) 20 MG tablet Take 40 mg by mouth daily.    . traZODone (DESYREL) 100 MG tablet Take 100 mg by mouth at bedtime as needed for sleep.     No current facility-administered medications for this visit.     Allergies:   Gabapentin and Lyrica [pregabalin]    Social History:  The patient  reports that she has been smoking  e-cigarettes and cigarettes. She has a 40.00 pack-year smoking history. She has quit using smokeless tobacco.  Her smokeless tobacco use included snuff. She reports current alcohol use. She reports current drug use. Drugs: "Crack" cocaine, Marijuana, and Oxycodone.   Family History:  The patient's family history includes Breast cancer in her mother; Cancer in her mother; Clotting disorder in her mother; Diabetes in her mother; Emphysema in her sister; Heart  attack in her mother; Heart disease in her father and mother; Hypertension in her father and mother.    ROS: All other systems are reviewed and negative. Unless otherwise mentioned in H&P    PHYSICAL EXAM: VS:  LMP 11/27/2012  , BMI There is no height or weight on file to calculate BMI. GEN: Well nourished, well developed, in no acute distress HEENT: normal Neck: no JVD, carotid bruits, or masses Cardiac: RRR; no murmurs, no S3,, rubs, or gallops, 3+ pitting edema to the knees bilaterally with erythema in the left lower extremity with tight shiny skin, right pre-tibial 2+ edema with dressing, weeping stains on dressing .   Respiratory:  Clear to auscultation bilaterally, normal work of breathing GI: soft, nontender, nondistended, + BS MS: no deformity or atrophy Skin: warm and dry, no rash Neuro:  Strength and sensation are intact Psych: euthymic mood, full affect   EKG:  NSR rate of 65 bpm. Non-specific T wave abnormality.   Recent Labs: 08/18/2018: ALT 21 10/21/2018: B Natriuretic Peptide 548.1; TSH 1.157 10/24/2018: Hemoglobin 10.4; Platelets 357 11/01/2018: BUN 31; Creatinine, Ser 1.25; Potassium 3.9; Sodium 141    Lipid Panel    Component Value Date/Time   CHOL 146 03/27/2018 1138   TRIG 222 (H) 03/27/2018 1138   HDL 39 (L) 03/27/2018 1138   CHOLHDL 3.7 03/27/2018 1138   VLDL 44 (H) 03/27/2018 1138   LDLCALC 63 03/27/2018 1138      Wt Readings from Last 3 Encounters:  11/22/18 242 lb (109.8 kg)  11/15/18 242 lb  (109.8 kg)  11/05/18 243 lb (110.2 kg)      Other studies Reviewed: Cardiac Cath   LHC 10/31/2014: Large caliber vessel with 30% mid stenosis. The first obtuse marginal branch was a small caliber vessel (1.5 mm) with diffuse 80% stenosis in the proximal and mid segment. The second obtuse marginal branch is small in caliber with 40% mid stenosis. The left posterolateral branch has mild diffuse plaque.   Echocardiogram 10/22/2018 Left ventricle: The cavity size was mildly dilated. There was   mild concentric hypertrophy. Systolic function was mildly   reduced. The estimated ejection fraction was in the range of 45%   to 50%. Wall motion was normal; there were no regional wall   motion abnormalities. Features are consistent with a pseudonormal   left ventricular filling pattern, with concomitant abnormal   relaxation and increased filling pressure (grade 2 diastolic   dysfunction). Doppler parameters are consistent with high   ventricular filling pressure. - Aortic valve: Transvalvular velocity was within the normal range.   There was no stenosis. There was no regurgitation. - Mitral valve: Transvalvular velocity was within the normal range.   There was no evidence for stenosis. There was mild regurgitation. - Left atrium: The atrium was severely dilated. - Right ventricle: The cavity size was normal. Wall thickness was   normal. Systolic function was normal. - Tricuspid valve: There was mild regurgitation. - Pulmonary arteries: Systolic pressure was moderately to severely   increased. PA peak pressure: 57 mm Hg (S).   ASSESSMENT AND PLAN:  1.  Acute on Chronic Mixed CHF: She has regained approximately 30 lbs since discharge from [facility] on [facility]/25/[facility]. She has been non-compliant with low sodium diet, reported by cousin with whom she lives, Eating high salt foods such as bags of potato chips and drinking several cans of diet drinks a day. She may or may to be complaint with medications.    She has been seen  on site by DOD, Dr. Stanford Breed who has also examined her. She will be admitted for IV diureses, labs, and strict I/O. She will be placed on a 1 liter fluid restriction.  2. Right lower leg cellulitis: She reports "pus drainage" from her right lower extremity with prior cellulitis in December. She is dressing this herself with left over wound care supplies. I did not unwrap her leg to examine it.    3. PAD: Followed by Dr. Gwenlyn Found. Atherectomy I's are entire right SFA and she subsequently underwent right second ray resection by Dr. Sharol Given to on 04/28/17.She has stopped smoking since that hospitalization. She also underwent a right transmetatarsal amputation by Dr.Dudawhich has subsequently healed. Recent lower extremity Dopplers performed 05/29/17 revealed a right ABI 0.65 with a patent right SFA and a left ABI 0.31.Follow up arteriogram was cancelled due to elevated renal function.  4. Ongoing tobacco abuse: She has no plans to quit. She was offered Nicoderm patches but insurance did not pay for these and she states she could not afford them.  5. PAF: On Eliquis and bisoprolol, and amiodarone.   6. Uncontrolled diabetes:  To be followed by PCP. Will need sliding scale coverage if necessary.   Current medicines are reviewed at length with the patient today.    Labs/ tests ordered today include: Admission labs.   Phill Myron. West Pugh, ANP, AACC   11/25/2018 12:29 PM    [facility] [place] [address] Office [phone] Fax [phone]  As above, patient seen and examined.  Briefly she is a 58 year old female with past medical history of peripheral vascular disease, chronic combined systolic/diastolic congestive heart failure, nonischemic cardiomyopathy, paroxysmal atrial fibrillation, diabetes mellitus, hypertension, tobacco abuse, noncompliance admitted with acute on chronic combined systolic/diastolic congestive heart failure.  Last  echocardiogram December showed ejection fraction 45 to 50%.  At that time she had been admitted for CHF and improved with diuresis.  Also treated with doxycycline for cellulitis.  Since discharge she has had progressive weight gain, dyspnea on exertion and bilateral lower extremity edema.  She has drainage from her right lower extremity.  She denies fevers or chest pain.  She has not been compliant with low-sodium diet or fluid restriction.  Unclear if she has been compliant with medications.  Continues to smoke.  Markedly volume overloaded on examination.  1 acute on chronic combined systolic/diastolic congestive heart failure-plan to admit and begin IV diuresis with Lasix 80 mg twice daily.  Follow renal function closely.  She is markedly volume overloaded.  We discussed daily weights, fluid restriction to 1 L daily and low-sodium diet.  I also explained the importance of compliance.  2 probable right lower extremity cellulitis-she will need a wound care consult and further evaluation possibly by primary care.  3 paroxysmal atrial fibrillation-would continue amiodarone and apixaban.  4 tobacco abuse-patient counseled on discontinuing.  5 peripheral vascular disease-would continue statin.  6 cardiomyopathy-continue beta-blocker and ARB.  Kirk Ruths, MD

## 2018-11-26 NOTE — Progress Notes (Signed)
1600 -Received patient CBG 50, given orange juice and graham crackers. 1713 rechecked cbg 75

## 2018-11-26 NOTE — Patient Instructions (Addendum)
BED CONTROL WILL BE CALLING YOU (HOPEFULLY WITHIN THE HOUR)@ [phone], FOR YOUR ADMISSION-PLEASE MAKE SURE TO ANSWER YOUR CELL PHONE  PT LEFT WITHOUT PRINTED AVS

## 2018-11-27 ENCOUNTER — Inpatient Hospital Stay (HOSPITAL_COMMUNITY): Payer: Medicare Other

## 2018-11-27 LAB — AST: AST: 27 U/L (ref 15–41)

## 2018-11-27 LAB — CBC WITH DIFFERENTIAL/PLATELET
Abs Immature Granulocytes: 0.09 10*3/uL — ABNORMAL HIGH (ref 0.00–0.07)
BASOS ABS: 0 10*3/uL (ref 0.0–0.1)
Basophils Relative: 0 %
Eosinophils Absolute: 0.2 10*3/uL (ref 0.0–0.5)
Eosinophils Relative: 1 %
HCT: 28.7 % — ABNORMAL LOW (ref 36.0–46.0)
Hemoglobin: 8.2 g/dL — ABNORMAL LOW (ref 12.0–15.0)
Immature Granulocytes: 1 %
Lymphocytes Relative: 13 %
Lymphs Abs: 1.7 10*3/uL (ref 0.7–4.0)
MCH: 28.6 pg (ref 26.0–34.0)
MCHC: 28.6 g/dL — AB (ref 30.0–36.0)
MCV: 100 fL (ref 80.0–100.0)
Monocytes Absolute: 0.9 10*3/uL (ref 0.1–1.0)
Monocytes Relative: 7 %
Neutro Abs: 9.8 10*3/uL — ABNORMAL HIGH (ref 1.7–7.7)
Neutrophils Relative %: 78 %
Platelets: 325 10*3/uL (ref 150–400)
RBC: 2.87 MIL/uL — ABNORMAL LOW (ref 3.87–5.11)
RDW: 16.3 % — AB (ref 11.5–15.5)
WBC: 12.6 10*3/uL — ABNORMAL HIGH (ref 4.0–10.5)
nRBC: 0 % (ref 0.0–0.2)

## 2018-11-27 LAB — BASIC METABOLIC PANEL
ANION GAP: 9 (ref 5–15)
BUN: 29 mg/dL — ABNORMAL HIGH (ref 6–20)
CO2: 25 mmol/L (ref 22–32)
Calcium: 8.2 mg/dL — ABNORMAL LOW (ref 8.9–10.3)
Chloride: 108 mmol/L (ref 98–111)
Creatinine, Ser: 1.33 mg/dL — ABNORMAL HIGH (ref 0.44–1.00)
GFR calc Af Amer: 52 mL/min — ABNORMAL LOW (ref >60)
GFR calc non Af Amer: 45 mL/min — ABNORMAL LOW (ref >60)
GLUCOSE: 96 mg/dL (ref 70–99)
Potassium: 3.5 mmol/L (ref 3.5–5.1)
Sodium: 142 mmol/L (ref 135–145)

## 2018-11-27 LAB — BRAIN NATRIURETIC PEPTIDE: B Natriuretic Peptide: 511.3 pg/mL — ABNORMAL HIGH (ref 0.0–100.0)

## 2018-11-27 LAB — GLUCOSE, CAPILLARY
Glucose-Capillary: 102 mg/dL — ABNORMAL HIGH (ref 70–99)
Glucose-Capillary: 219 mg/dL — ABNORMAL HIGH (ref 70–99)
Glucose-Capillary: 173 mg/dL — ABNORMAL HIGH (ref 70–99)
Glucose-Capillary: 198 mg/dL — ABNORMAL HIGH (ref 70–99)

## 2018-11-27 LAB — HEMOGLOBIN A1C
Hgb A1c MFr Bld: 6.3 % — ABNORMAL HIGH (ref 4.8–5.6)
MEAN PLASMA GLUCOSE: 134.11 mg/dL

## 2018-11-27 LAB — ALT: ALT: 45 U/L — AB (ref 0–44)

## 2018-11-27 MED ORDER — IPRATROPIUM-ALBUTEROL 0.5-2.5 (3) MG/3ML IN SOLN: 3.0000 mL | Freq: Four times a day (QID) | RESPIRATORY_TRACT | Status: DC | PRN

## 2018-11-27 MED ORDER — ADULT MULTIVITAMIN W/MINERALS CH
1.0000 | ORAL_TABLET | Freq: Every day | ORAL | Status: DC
  Administered 2018-11-27 – 2018-12-03 (×7): 1 via ORAL
  Filled 2018-11-27 (×7): qty 1

## 2018-11-27 MED ORDER — POTASSIUM CHLORIDE CRYS ER 20 MEQ PO TBCR
40.0000 meq | EXTENDED_RELEASE_TABLET | Freq: Once | ORAL | Status: AC
  Administered 2018-11-27: 40 meq via ORAL
  Filled 2018-11-27: qty 2

## 2018-11-27 MED ORDER — METOLAZONE 5 MG PO TABS
5.0000 mg | ORAL_TABLET | Freq: Two times a day (BID) | ORAL | Status: DC
  Administered 2018-11-27 – 2018-11-28 (×3): 5 mg via ORAL
  Filled 2018-11-27 (×3): qty 1

## 2018-11-27 MED ORDER — SPIRONOLACTONE 12.5 MG HALF TABLET
12.5000 mg | ORAL_TABLET | Freq: Every day | ORAL | Status: DC
  Administered 2018-11-27: 12.5 mg via ORAL
  Filled 2018-11-27 (×2): qty 1

## 2018-11-27 MED ORDER — METOLAZONE 2.5 MG PO TABS
2.5000 mg | ORAL_TABLET | Freq: Once | ORAL | Status: AC
  Administered 2018-11-27: 2.5 mg via ORAL
  Filled 2018-11-27: qty 1

## 2018-11-27 MED ORDER — POTASSIUM CHLORIDE CRYS ER 20 MEQ PO TBCR
40.0000 meq | EXTENDED_RELEASE_TABLET | Freq: Two times a day (BID) | ORAL | Status: DC
  Administered 2018-11-27: 40 meq via ORAL
  Filled 2018-11-27 (×2): qty 2

## 2018-11-27 MED ORDER — TRAMADOL HCL 50 MG PO TABS
50.0000 mg | ORAL_TABLET | Freq: Three times a day (TID) | ORAL | Status: DC | PRN
  Administered 2018-11-27 – 2018-12-01 (×9): 50 mg via ORAL
  Filled 2018-11-27 (×10): qty 1

## 2018-11-27 NOTE — Progress Notes (Signed)
Received called from [facility] form infection prevention to communicate that patient needs to be on MRSA contact precautions. Patients last admit was positive for MRSA.  Contact precautions initiated.

## 2018-11-27 NOTE — Consult Note (Addendum)
Advanced Heart Failure Team Consult Note   Primary Physician: Tsosie Billing, MD PCP-Cardiologist:  Quay Burow, MD  Reason for Consultation: A/C combined HF  HPI:    [name] is seen today for evaluation of A/C combined HF at the request of Dr Stanford Breed.   [name] is a 58 y.o. female with history of PAD, carotid stenosis s/p left CEA, relatively mild CAD, chronic systolic CHF (EF 15-72%), paroxysmal atrial fibrillation, PAD s/p atherectomy of right SFA 04/2017, hx of junctional bradycardia, OSA no CPAP, cirrhosis, COPD, and prior substance abuse.   Admitted 10/5 through 08/19/2018 with alcohol intoxication and passed out in the parking lot. EMS was called. She did not have any fractures. Torsemide, spironolactone, metolazone was stopped due to AKI.  She was discharged the next day.   Last seen in HF clinic 09/03/18. Bisoprolol was increased. She was back on torsemide 40 mg daily at that time. She was referred to HF paramedicine program.   Admitted 12/8-12/12/19 with acute respiratory failure. She was treated with IV steroids and IV lasix. Also covered empirically with PO doxy for cellulitis and ?URI. Repeat echo showed improved EF 45-50%. DC weight: 242 lbs.  She tells me she has noticed BLE edema over the last two days. She is slightly more SOB, now with any exertion. She has not been weighing at home because she gets unsteady on her scale. +orthopnea. Sleeps in recliner. Denies any bleeding. + cough with white sputum. No fever or chills. She was urinating well "on and off" on home torsemide. Had not taken any extra or metolazone. Drinks 5-6 sodas + water all day. Eating high salt foods like fried chicken. Continues to smoke 1 ppd. Drinks a rare beer. Says she is taking all medications. She lives at home with her cousin, who drives her to appointments.   Seen in [facility] office yesterday with 30 lb weight gain. Also had green/yellow pus from BLE wounds. Cousin  reportedly found pills on the floor of her room, so medication compliance questioned. She was admitted from [facility] office. Pertinent admission labs include (from this am): K 3.5, creatinine 1.33, BNP 511 (prev 290-540), WBC 12.6, hemoglobin 8.2  She was started on 80 mg IV lasix BID. Diuresing well. Weight down 1 lb. SBP 120-140s.   Echo (1/17) with EF 45%, mild LV hypertrophy, moderate diastolic dysfunction, inferolateral severe hypokinesis, mildly decreased RV systolic function.   Echo (1/18) EF 40-45%, mild LVH, normal RV size and systolic function.   Echo 12/19: EF 45-50%, grade 2 DD, mild MR, LA severely dilated, RV normal, mild TR, PA peak pressure 57 mm Hg.   Review of Systems: [y] = yes, [ ]  = no   General: Weight gain Blue.Reese ]; Weight loss [ ] ; Anorexia [ ] ; Fatigue [ ] ; Fever [ ] ; Chills [ ] ; Weakness [ ]   Cardiac: Chest pain/pressure [ ] ; Resting SOB [ ] ; Exertional SOB Blue.Reese ]; Orthopnea Blue.Reese ]; Pedal Edema Blue.Reese ]; Palpitations [ ] ; Syncope [ ] ; Presyncope [ ] ; Paroxysmal nocturnal dyspnea[ ]   Pulmonary: Cough Blue.Reese ]; [facility] ]; Hemoptysis[ ] ; Sputum [ y]; Snoring [ ]   GI: Vomiting[ ] ; Dysphagia[ ] ; Melena[ ] ; Hematochezia [ ] ; Heartburn[ ] ; Abdominal pain [ ] ; Constipation [ ] ; Diarrhea [ ] ; BRBPR [ ]   GU: Hematuria[ ] ; Dysuria [ ] ; Nocturia[ ]   Vascular: Pain in legs with walking Blue.Reese ]; Pain in feet with lying flat [ ] ; Non-healing sores Blue.Reese ]; Stroke [ ] ;  TIA [ ] ; Slurred speech [ ] ;  Neuro: Headaches[ ] ; Vertigo[ ] ; Seizures[ ] ; Paresthesias[ ] ;Blurred vision [ ] ; Diplopia [ ] ; Vision changes [ ]   Ortho/Skin: Arthritis Blue.Reese ]; Joint pain [ ] ; Muscle pain [ ] ; Joint swelling [ ] ; Back Pain [ ] ; Rash [ ]   Psych: Depression[ ] ; Anxiety[ ]   Heme: Bleeding problems [ ] ; Clotting disorders [ ] ; Anemia [ y]  Endocrine: Diabetes [ y]; Thyroid dysfunction[y ]  Home Medications Prior to Admission medications   Medication Sig Start Date End Date Taking? Authorizing Provider  albuterol (PROVENTIL  HFA;VENTOLIN HFA) 108 (90 Base) MCG/ACT inhaler Inhale 2 puffs into the lungs every 6 (six) hours as needed for wheezing or shortness of breath. 08/09/17  Yes Medina-Vargas, Monina C, NP  amiodarone (PACERONE) 100 MG tablet TAKE 1 TABLET (100 MG TOTAL) BY MOUTH DAILY Patient taking differently: Take 100 mg by mouth daily.  09/20/18  Yes Larey Dresser, MD  amLODipine (NORVASC) 10 MG tablet Take 1 tablet (10 mg total) by mouth daily. 09/26/18  Yes Larey Dresser, MD  apixaban (ELIQUIS) 5 MG TABS tablet Take 1 tablet (5 mg total) by mouth 2 (two) times daily. 08/09/17  Yes Medina-Vargas, Monina C, NP  atorvastatin (LIPITOR) 40 MG tablet TAKE 1 TABLET BY MOUTH ONCE EVERY EVENING Patient taking differently: Take 40 mg by mouth daily. TAKE 1 TABLET BY MOUTH ONCE EVERY EVENING 08/09/17  Yes Medina-Vargas, Monina C, NP  bisoprolol (ZEBETA) 10 MG tablet Take 1 tablet (10 mg total) by mouth daily. 09/03/18 09/03/19 Yes Clegg, [name] D, NP  cholecalciferol (VITAMIN D) 1000 units tablet Take 1,000 Units by mouth daily.    Yes [provider]  folic acid (FOLVITE) 1 MG tablet Take 1 tablet (1 mg total) by mouth daily. Patient taking differently: Take 1 mg by mouth daily.  10/25/18  Yes Hall, Carole N, DO  insulin aspart (NOVOLOG) 100 UNIT/ML injection Inject 30 Units into the skin 3 (three) times daily before meals. 08/09/17  Yes Medina-Vargas, Monina C, NP  Insulin Glargine (LANTUS SOLOSTAR) 100 UNIT/ML Solostar Pen Inject 72 Units into the skin at bedtime.   Yes [provider]  levothyroxine (SYNTHROID, LEVOTHROID) 50 MCG tablet Take 1 tablet (50 mcg total) by mouth daily. 08/09/17  Yes Medina-Vargas, Monina C, NP  losartan (COZAAR) 100 MG tablet Take 1 tablet (100 mg total) by mouth daily. 10/18/18  Yes Larey Dresser, MD  metolazone (ZAROXOLYN) 2.5 MG tablet Take 2.5 mg by mouth once a week.   Yes [provider]  Multiple Vitamin (MULTIVITAMIN WITH MINERALS) TABS tablet Take 1 tablet  by mouth daily. 10/25/18  Yes Irene Pap N, DO  thiamine 100 MG tablet Take 1 tablet (100 mg total) by mouth daily. Patient taking differently: Take 100 mg by mouth daily.  10/25/18  Yes Kayleen Memos, DO  torsemide (DEMADEX) 20 MG tablet Take 40 mg by mouth 2 (two) times daily.    Yes [provider]  traZODone (DESYREL) 100 MG tablet Take 100 mg by mouth at bedtime as needed for sleep.   Yes [provider]  nicotine (NICODERM CQ) 21 mg/24hr patch Place 1 patch (21 mg total) onto the skin daily. Patient not taking: Reported on 11/26/2018 10/18/18   Larey Dresser, MD    Past Medical History: Past Medical History:  Diagnosis Date  . Alcohol abuse   . Alcoholic cirrhosis ([place])   . Anemia   . Anxiety   .  Arthritis    "knees" (11/26/2018)  . B12 deficiency   . CAD (coronary artery disease)    a. cath 11/10/2014 LM nl, LAD min irregs, D1 30 ost, D2 50d, LCX 23m, OM1 80 p/m (1.5 mm vessel), OM2 45m, RCA nondom 73m-->med rx.. Demand ischemia in the setting of rapid a-fib.  . Cardiomyopathy ([place])   . Carotid artery disease ([place])    a. 01/2015 Carotid Angio: RICA 914, LICA 78G. s/p L carotid endarterectomy 02/2015.  . Cellulitis    lower extremities  . Chronic combined systolic and diastolic CHF (congestive heart failure) ([place])    a. Last echo 12/205: EF 40-45%, inferoapical/posterior HK, not technically sufficient to allow eval of LV diastolic function, normal LA in size..  . Cocaine abuse ([place])   . COPD (chronic obstructive pulmonary disease) ([place])   . Critical lower limb ischemia   . Depression   . Diabetic peripheral neuropathy ([name])   . Elevated troponin    a. Chronic elevation.  [name] GERD (gastroesophageal reflux disease)   . Hyperlipemia   . Hypertension   . Hypokalemia   . Hypomagnesemia   . Hypothyroidism   . Marijuana abuse   . Narcotic abuse ([place])   . Noncompliance   . NSVT (nonsustained ventricular tachycardia) ([place])   . Obesity   . PAF (paroxysmal  atrial fibrillation) ([place])   . Paroxysmal atrial tachycardia ([place])   . Peripheral arterial disease ([place])    a. 01/2015 Angio/PTA: LSFA 100 w/ recon @ adductor canal and 1 vessel runoff via AT, RSFA 99 (atherectomy/pta) - 1 vessel runoff via diff dzs peroneal.  . Pneumonia    "once or twice" (11/26/2018)  . Poorly controlled type 2 diabetes mellitus ([place])   . Renal insufficiency    a. 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  [name] Sleep apnea    "couldn't handle wearing the mask" (11/26/2018)  . Symptomatic bradycardia    avoid AV blocking agent per EP  . Tobacco abuse     Past Surgical History: Past Surgical History:  Procedure Laterality Date  . AMPUTATION Right 06/14/2017   Procedure: Right foot transmetatarsal amputation;  Surgeon: Newt Minion, MD;  Location: [place];  Service: Orthopedics;  Laterality: Right;  . AMPUTATION TOE Right 04/28/2017   Procedure: AMPUTATION OF RIGHT SECOND RAY;  Surgeon: Newt Minion, MD;  Location: [place];  Service: Orthopedics;  Laterality: Right;  . CARDIAC CATHETERIZATION    . CARDIAC CATHETERIZATION N/A 01/12/2016   Procedure: Temporary Wire;  Surgeon: Minus Breeding, MD;  Location: [place] CV [facility];  Service: Cardiovascular;  Laterality: N/A;  . CARDIOVERSION  ~ 02/2013   "twice"   . CAROTID ANGIOGRAM N/A 01/15/2015   Procedure: CAROTID ANGIOGRAM;  Surgeon: [name], MD;  Location: [facility] CATH LAB;  Service: Cardiovascular;  Laterality: N/A;  . DILATION AND CURETTAGE OF UTERUS  1988  . ENDARTERECTOMY Left 02/19/2015   Procedure: LEFT CAROTID ENDARTERECTOMY ;  Surgeon: Serafina Mitchell, MD;  Location: [place];  Service: Vascular;  Laterality: Left;  . LEFT HEART CATHETERIZATION WITH CORONARY ANGIOGRAM N/A 10/31/2014   Procedure: LEFT HEART CATHETERIZATION WITH CORONARY ANGIOGRAM;  Surgeon: Burnell Blanks, MD;  Location[facility] CATH LAB;  Service: Cardiovascular;  Laterality: N/A;  . LOWER EXTREMITY ANGIOGRAM N/A 09/10/2013   Procedure: LOWER EXTREMITY ANGIOGRAM;   Surgeon: [name], MD;  Location: [facility] CATH LAB;  Service: Cardiovascular;  Laterality: N/A;  . LOWER EXTREMITY ANGIOGRAM N/A 01/15/2015   Procedure: LOWER EXTREMITY ANGIOGRAM;  Surgeon:  [name], MD;  Location: [facility] CATH LAB;  Service: Cardiovascular;  Laterality: N/A;  . LOWER EXTREMITY ANGIOGRAPHY N/A 04/13/2017   Procedure: Lower Extremity Angiography;  Surgeon: [name], MD;  Location: [place] CV [facility];  Service: Cardiovascular;  Laterality: N/A;  . PERIPHERAL VASCULAR INTERVENTION  04/13/2017   Procedure: Peripheral Vascular Intervention;  Surgeon: [name], MD;  Location: [place] CV LAB;  Service: Cardiovascular;;    Family History: Family History  Problem Relation Age of Onset  . Hypertension Mother   . Diabetes Mother   . Cancer Mother        breast, ovarian, colon  . Clotting disorder Mother   . Heart disease Mother   . Heart attack Mother   . Breast cancer Mother        in 36's  . Hypertension Father   . Heart disease Father   . Emphysema Sister        smoked    Social History: Social History   Socioeconomic History  . Marital status: Divorced    Spouse name: Not on file  . Number of children: 1  . Years of education: 66  . Highest education level: 12th grade  Occupational History  . Occupation: disabled  Social Needs  . Financial resource strain: Somewhat hard  . Food insecurity:    Worry: Never true    Inability: Never true  . Transportation needs:    Medical: No    Non-medical: No  Tobacco Use  . Smoking status: Current Some Day Smoker    Packs/day: 1.00    Years: 44.00    Pack years: 44.00    Types: E-cigarettes, Cigarettes  . Smokeless tobacco: Former Systems developer    Types: Snuff  Substance and Sexual Activity  . Alcohol use: Yes    Comment: 11/26/2018 "couple drinks/month maybe"  . Drug use: Yes    Types: "Crack" cocaine, Marijuana, Oxycodone    Comment: 11/26/2018 "last hard drug use was ~ 09/08/2013; still smoke  marijuana ~ once/month"  . Sexual activity: Not Currently  Lifestyle  . Physical activity:    Days per week: Not on file    Minutes per session: Not on file  . Stress: Not on file  Relationships  . Social connections:    Talks on phone: Not on file    Gets together: Not on file    Attends religious service: Not on file    Active member of club or organization: Not on file    Attends meetings of clubs or organizations: Not on file    Relationship status: Not on file  Other Topics Concern  . Not on file  Social History Narrative   Lives in [place], in [place] with sister.  They are looking to move but don't have a place to go yet.      Allergies:  Allergies  Allergen Reactions  . Gabapentin Nausea And Vomiting and Other (See Comments)    POSSIBLE SHAKING? TAKING MED CURRENTLY  . Lyrica [Pregabalin] Other (See Comments)    Shaking Pt still taking lyrica--"probably will stop taking"     Objective:    Vital Signs:   Temp:  [98.2 F (36.8 C)-98.9 F (37.2 C)] 98.9 F (37.2 C) (01/14 0819) Pulse Rate:  [65-84] 77 (01/14 0819) Resp:  [18-20] 20 (01/14 0451) BP: (127-148)/(47-64) 148/62 (01/14 0819) SpO2:  [89 %-98 %] 94 % (01/14 0819) Weight:  [121.5 kg-122 kg] 121.5 kg (01/14 0451) Last BM Date: 11/26/18  Weight change: Filed Weights   11/26/18 1602 11/27/18 0451  Weight: 122 kg 121.5 kg    Intake/Output:   Intake/Output Summary (Last 24 hours) at 11/27/2018 1012 Last data filed at 11/27/2018 0900 Gross per 24 hour  Intake 960 ml  Output 2100 ml  Net -1140 ml      Physical Exam    General:  Obese. No resp difficulty HEENT: normal Neck: supple. Neck thick. JVP to jaw Carotids 2+ bilat; no bruits. No lymphadenopathy or thyromegaly appreciated. Cor: PMI nondisplaced. Regular rate & rhythm. 2/6 TR Lungs: clear with decreased BS throoghout. No wheeze Abdomen: soft, nontender, +distended. No hepatosplenomegaly. No bruits or masses. Good bowel sounds. Extremities: no  cyanosis, clubbing, rash, BLE 2-3+ edema, very tight. R transmet amputation. Right leg wrapped. Neuro: alert & orientedx3, cranial nerves grossly intact. moves all 4 extremities w/o difficulty. Affect pleasant   Telemetry   NSR 60-70s. Personally reviewed.   EKG    NSR 65 bpm, QTc 480 ms. Personally reviewed.   Labs   Basic Metabolic Panel: Recent Labs  Lab 11/27/18 0519  NA 142  K 3.5  CL 108  CO2 25  GLUCOSE 96  BUN 29*  CREATININE 1.33*  CALCIUM 8.2*    Liver Function Tests: No results for input(s): AST, ALT, ALKPHOS, BILITOT, PROT, ALBUMIN in the last 168 hours. No results for input(s): LIPASE, AMYLASE in the last 168 hours. No results for input(s): AMMONIA in the last 168 hours.  CBC: Recent Labs  Lab 11/27/18 0519  WBC 12.6*  NEUTROABS 9.8*  HGB 8.2*  HCT 28.7*  MCV 100.0  PLT 325    Cardiac Enzymes: No results for input(s): CKTOTAL, CKMB, CKMBINDEX, TROPONINI in the last 168 hours.  BNP: BNP (last 3 results) Recent Labs    10/18/18 1109 10/21/18 0125 11/27/18 0519  BNP 410.0* 548.1* 511.3*    ProBNP (last 3 results) No results for input(s): PROBNP in the last 8760 hours.   CBG: Recent Labs  Lab 11/26/18 1608 11/26/18 1710 11/26/18 2129 11/27/18 0734  GLUCAP 50* 75 140* 102*    Coagulation Studies: No results for input(s): LABPROT, INR in the last 72 hours.   Imaging    No results found.   Medications:     Current Medications: . amiodarone  100 mg Oral Daily  . amLODipine  10 mg Oral Daily  . apixaban  5 mg Oral BID  . atorvastatin  40 mg Oral q1800  . bisoprolol  10 mg Oral Daily  . furosemide  80 mg Intravenous Q12H  . insulin aspart  0-20 Units Subcutaneous TID WC  . insulin glargine  72 Units Subcutaneous QHS  . levothyroxine  50 mcg Oral Q0600  . losartan  100 mg Oral Daily  . nicotine  21 mg Transdermal Daily  . potassium chloride  20 mEq Oral BID  . sodium chloride flush  3 mL Intravenous Q12H  . sodium  chloride flush  3 mL Intravenous Q12H  . sodium chloride flush  3 mL Intravenous Q12H  . thiamine  100 mg Oral Daily  . traZODone  100 mg Oral QHS     Infusions: . sodium chloride    . sodium chloride    . sodium chloride         Patient Profile   [name] is a 58 y.o. female with history of PAD, carotid stenosis s/p left CEA, relatively mild CAD, chronic systolic CHF (EF 69-67%), paroxysmal atrial fibrillation, PAD s/p atherectomy  of right SFA 04/2017, hx of junctional bradycardia, OSA no CPAP, cirrhosis, COPD, and prior substance abuse.   Admitted from [facility] office with 30 lb weight gain in setting of diet and medication noncompliance.   Assessment/Plan   1. Acute on chronic systolic CHF: Echo 29/9242: EF 45-50% (improved from 40-45% 11/2016). Has been presumed nonischemic given LHC in 12/15 showing only 80% stenosis in small OM1.   - Volume markedly overloaded in setting of medication and dietary noncompliance. Weight up 30 lbs from recent DC in December.  - Continue 80 mg IV lasix BID. Give 2.5 mg metolazone x1 today.  - Hold bisoprolol with massive volume overload.   - Continue losartan 25 mg daily. - Add spiro 12.5 mg daily.  - RLE wrapped due to wounds. Add ACE wrap to LLE (no unna boots with PAD) - RD has been consulted 2. Atrial fibrillation: Paroxysmal.   - Continue amiodarone 100 mg daily. Add on LFTs to labs. TSH stable last month. PCP following hypothyroidism (on Levoxyl). Needs regular eye exams. - Continue eliquis 5 mg twice a day.  - May need to stop if she continues to have falls.  3. PAD: Claudication on left seems limiting.  No ulcerations.  She has foot pain almost constantly.  Not candidate for cilostazol with CHF.  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  - Peripheral angiography deferred with AKI.  - Now has RLE wound 4. COPD: - Continues to smoke 1 ppd. Talked about cessation.  -  She is wheezing on exam. WBC 12.6. Afebrile. Check CXR. Add PRN nebs.  5. H/o cirrhosis:  - Likely from ETOH. Add on LFTs.  6. OSA: - Unable to tolerate CPAP. No change.  7. CKD stage III:   - Monitor BMET daily. Creatinine stable 1.33 today.  8. HTN:  - Will manage with HF meds.  9. Anemia - Hemoglobin down to 8.2. Denies any bleeding. - Monitor CBC closely.   10. RLE wounds  - WOC RN consulted. Right leg has two full thickness stasis wounds on calf. Foam dressing applied with ACE wrap.  - WBC 12.6. Afebrile.   Medication concerns reviewed with patient and pharmacy team. Barriers identified: medication and dietary compliance.   Length of Stay: Castle Shannon, NP  11/27/2018, 10:12 AM  Advanced Heart Failure Team Pager [phone] (M-F; 7a - 4p)  Please contact [facility] for night-coverage after hours (4p -7a ) and weekends on [email or web address]   Patient seen and examined with the above-signed Advanced Practice Provider and/or Housestaff. I personally reviewed laboratory data, imaging studies and relevant notes. I independently examined the patient and formulated the important aspects of the plan. I have edited the note to reflect any of my changes or salient points. I have personally discussed the plan with the patient and/or family.  58 y/o woman with COPD and ongoing tobacco use, obesity, PAD, PAF and systolic /diastolic HF EF 22-29% admitted with marked volume overload and RLE wound.   She is markedly volume overloaded on exam. Will treat with lasix 80 IV bid and add metolazone. Would wrap LEs with ACE wraps (avoid UNNA boots with severe PAD). Maintaining NSR on amio. AC with Eliquis. Will continue.  Watch renal function closely with diuresis.   Glori Bickers, MD  4:12 PM

## 2018-11-27 NOTE — Consult Note (Addendum)
[facility] Nurse wound consult note Reason for Consult: Consult requested for cellulitis to RLE.  Pt states she wore compression stockings prior to admission but her leg developed increased edema and blistering and she could not wear them this week.   Wound type: Left leg without edema, erythremia, no open wounds or drainage. Right leg with 2 areas of full thickness stasis wounds which evolved after the blisters ruptured, according to the patient. Measurement: Right anterior calf 1X1X.2cm and 2X5X.2cm, red moist wound beds, mod amt yellow drainage, no odor.  Generalized edema to right leg. Dressing procedure/placement/frequency: Foam dressing to protect and absorb drainage, ace wrap to provide light compression and reduce edema. Discussed plan of care with patient. Please re-consult if further assistance is needed.  Thank-you,  Julien Girt MSN, [place], [place], [place], [place]

## 2018-11-27 NOTE — Progress Notes (Signed)
Initial Nutrition Assessment  DOCUMENTATION CODES:   Morbid obesity  INTERVENTION:  Provided education on low sodium, diabetic diet. MVI with minerals daily.  NUTRITION DIAGNOSIS:   Increased nutrient needs related to wound healing as evidenced by estimated needs.  GOAL:   Patient will meet greater than or equal to 90% of their needs  MONITOR:   PO intake, Labs, I & O's, Skin, Weight trends  REASON FOR ASSESSMENT:   Consult Assessment of nutrition requirement/status(HF)  ASSESSMENT:   Pt with PMH of HTN, GERD, PAD, CHF, cellulitis in lower extremities, T2DM, ETOH and PAF. Underwent amputation of right toes 06/2017. Pt admitted on 1/13 for acute respiratory failure, LEE, uncontrolled T2DM and cellulitis.    PT was alert when we saw her. She reports that her appetite has been good recently, and that she ate 100% of both breakfast and lunch today (1/14). On a normal day breakfast includes cereal (cheerios) with milk, possibly scrambled eggs and coffee. Lunch is chicken or hamburger, with vegetables (broccoli, cauliflower, carrots) and diet coke, milk, or water. Pt reports that her cousin cooks all of her foods and does not add any salt to her meals. Her cousin bakes or broils the chicken, and roasts, steams, or boils the vegetables. Dinner is typically the same as lunch. She mentioned that she does enjoy eating fruits, but the food bank she goes to most often provides canned fruit in light syrup.   RD educated about how fruit is more appropriate for a diabetic compared to other options for snacks. Spoke at length about low sodium diet, diabetic diet. Pt reported that she and her cousin are prepared to make lifestyle changes once she is discharged (going to the gym and improving diet). RD encouraged these changes. Encouraged the use of MVI and protein to assist in wound healing.   Pt reports that she has very limited mobility; only able to go from the couch to the bathroom and to the  car. Her right foot has had all her toes amputated and there is severe edema on both left and right leg. Pt reports that wt has fluctuated over the last few months which is suspected d/t the edema. (28# wt increase in 1 month)  Medications reviewed and include: Lasix, Lipitor, 0-20 insulin aspart units TID w/meals, 72 units insulin glargine Labs reviewed   Lab Results  Component Value Date   HGBA1C 6.3 (H) 11/27/2018   CBG (last 3)  Recent Labs    11/27/18 0734 11/27/18 1128 11/27/18 1636  GLUCAP 102* 219* 173*    NUTRITION - FOCUSED PHYSICAL EXAM:    Most Recent Value  Orbital Region  No depletion  Upper Arm Region  No depletion  Thoracic and Lumbar Region  No depletion  Buccal Region  No depletion  Temple Region  No depletion  Clavicle Bone Region  No depletion  Clavicle and Acromion Bone Region  No depletion  Scapular Bone Region  No depletion  Dorsal Hand  No depletion  Patellar Region  No depletion  Anterior Thigh Region  No depletion  Posterior Calf Region  No depletion  Edema (RD Assessment)  Severe [right and left lower legs]  Hair  Reviewed  Eyes  Reviewed  Mouth  Reviewed  Skin  Reviewed  Nails  Reviewed       Diet Order:   Diet Order            Diet 2 gram sodium Room service appropriate? Yes; Fluid consistency: Thin  Diet effective  now              EDUCATION NEEDS:   Education needs have been addressed  Skin:  Skin Assessment: Skin Integrity Issues: Skin Integrity Issues:: Other (Comment) Other: Blister (Right,lower leg), Cellulitis (Right&Left leg), Weeping (Right leg)  Last BM:  Unknown  Height:   Ht Readings from Last 1 Encounters:  11/26/18 5\' 2"  (1.575 m)    Weight:   Wt Readings from Last 1 Encounters:  11/27/18 121.5 kg    Ideal Body Weight:  50 kg  BMI:  Body mass index is 48.98 kg/m.  Estimated Nutritional Needs:   Kcal:  1600-1800kcal  Protein:  100-110g  Fluid:  >1.6L    [employer]

## 2018-11-27 NOTE — Progress Notes (Signed)
   11/27/18 1100  Clinical Encounter Type  Visited With Patient  Visit Type Initial  Referral From Nurse  Consult/Referral To Chaplain  Spiritual Encounters  Spiritual Needs Emotional;Prayer  Stress Factors  Patient Stress Factors Health changes;Major life changes;Exhausted   Responded to spiritual care consult. PT was alert and sitting in chair. PT mentioned that she was a little depressed because of the lack of support she doesn't have at home for life goals. She did mention her desire to loose weight so she can feel better about herself. I offered spiritual care with a listening ear, words of encouragement, and prayer. Chaplain available as needed.  Chaplain Fidel Levy  [phone]

## 2018-11-27 NOTE — Care Management Note (Signed)
Case Management Note  Patient Details  Name: HARRISON PAULSON MRN: [id number] Date of Birth: 1960-11-22  Subjective/Objective:    CHF               Action/Plan: 11/27/2018- Patient known to me from previous admission; Noted admitted 3 times in 6 months; CM will continue to follow for progression of care; B Pennie Rushing   10/19/2018 - Action/Plan: Patient lives at home with her family; PCP:Sun, Mikeal Hawthorne, MD; has private insurance with [facility] Medicare with prescription drug coverage; pharmacy of choice is [facility]; DME - cane, walker and wheelchair at home; [facility] choice offered, pt chose [place]; [facility] list with rating given to patient and 2nd copy placed on shadow chart; Dan with [facility] called for arrangements. CM will continue to follow for progression of care. Mindi Slicker [facility]  Expected Discharge Date:     Possibly 11/21/2018             Expected Discharge Plan:   Home with [place]  Status of Service:   In progress Sherrilyn Rist [phone] 11/27/2018, 11:39 AM

## 2018-11-28 LAB — GLUCOSE, CAPILLARY
GLUCOSE-CAPILLARY: 211 mg/dL — AB (ref 70–99)
Glucose-Capillary: 68 mg/dL — ABNORMAL LOW (ref 70–99)
Glucose-Capillary: 136 mg/dL — ABNORMAL HIGH (ref 70–99)
Glucose-Capillary: 237 mg/dL — ABNORMAL HIGH (ref 70–99)
Glucose-Capillary: 229 mg/dL — ABNORMAL HIGH (ref 70–99)

## 2018-11-28 LAB — BASIC METABOLIC PANEL
Anion gap: 7 (ref 5–15)
BUN: 29 mg/dL — ABNORMAL HIGH (ref 6–20)
CO2: 28 mmol/L (ref 22–32)
Calcium: 8.5 mg/dL — ABNORMAL LOW (ref 8.9–10.3)
Chloride: 106 mmol/L (ref 98–111)
Creatinine, Ser: 1.32 mg/dL — ABNORMAL HIGH (ref 0.44–1.00)
GFR calc Af Amer: 52 mL/min — ABNORMAL LOW (ref >60)
GFR calc non Af Amer: 45 mL/min — ABNORMAL LOW (ref >60)
Glucose, Bld: 138 mg/dL — ABNORMAL HIGH (ref 70–99)
Potassium: 4.6 mmol/L (ref 3.5–5.1)
Sodium: 141 mmol/L (ref 135–145)

## 2018-11-28 LAB — CBC
HCT: 27.7 % — ABNORMAL LOW (ref 36.0–46.0)
Hemoglobin: 8.1 g/dL — ABNORMAL LOW (ref 12.0–15.0)
MCH: 28.6 pg (ref 26.0–34.0)
MCHC: 29.2 g/dL — ABNORMAL LOW (ref 30.0–36.0)
MCV: 97.9 fL (ref 80.0–100.0)
Platelets: 325 10*3/uL (ref 150–400)
RBC: 2.83 MIL/uL — ABNORMAL LOW (ref 3.87–5.11)
RDW: 16 % — ABNORMAL HIGH (ref 11.5–15.5)
WBC: 13.3 10*3/uL — ABNORMAL HIGH (ref 4.0–10.5)
nRBC: 0 % (ref 0.0–0.2)

## 2018-11-28 LAB — OCCULT BLOOD X 1 CARD TO LAB, STOOL: Fecal Occult Bld: NEGATIVE

## 2018-11-28 MED ORDER — POTASSIUM CHLORIDE CRYS ER 20 MEQ PO TBCR
40.0000 meq | EXTENDED_RELEASE_TABLET | Freq: Every day | ORAL | Status: DC
  Administered 2018-11-28 – 2018-12-03 (×6): 40 meq via ORAL
  Filled 2018-11-28 (×6): qty 2

## 2018-11-28 MED ORDER — FUROSEMIDE 10 MG/ML IJ SOLN
80.0000 mg | Freq: Two times a day (BID) | INTRAMUSCULAR | Status: DC
  Administered 2018-11-28: 80 mg via INTRAVENOUS
  Filled 2018-11-28: qty 8

## 2018-11-28 MED ORDER — LOSARTAN POTASSIUM 50 MG PO TABS
150.0000 mg | ORAL_TABLET | Freq: Every day | ORAL | Status: DC
  Administered 2018-11-28 – 2018-12-02 (×5): 150 mg via ORAL
  Filled 2018-11-28 (×5): qty 3

## 2018-11-28 MED ORDER — BISOPROLOL FUMARATE 5 MG PO TABS
5.0000 mg | ORAL_TABLET | Freq: Every day | ORAL | Status: DC
  Administered 2018-11-28 – 2018-12-03 (×6): 5 mg via ORAL
  Filled 2018-11-28 (×6): qty 1

## 2018-11-28 MED ORDER — DOXYCYCLINE HYCLATE 100 MG PO TABS
100.0000 mg | ORAL_TABLET | Freq: Two times a day (BID) | ORAL | Status: AC
  Administered 2018-11-28 – 2018-12-02 (×10): 100 mg via ORAL
  Filled 2018-11-28 (×10): qty 1

## 2018-11-28 MED ORDER — SPIRONOLACTONE 25 MG PO TABS
25.0000 mg | ORAL_TABLET | Freq: Every day | ORAL | Status: DC
  Administered 2018-11-28: 12.5 mg via ORAL
  Administered 2018-11-29 – 2018-12-03 (×5): 25 mg via ORAL
  Filled 2018-11-28 (×6): qty 1

## 2018-11-28 NOTE — Care Management Note (Signed)
Case Management Note  Patient Details  Name: KIMLEY APSEY MRN: [id number] Date of Birth: 1961-01-16  Action/Plan: CM talked to patient at the bedside for [facility] choices, she requested [facility]; Dan with [facility] called for arrangements. CM will continue to follow for progression of care.  Expected Discharge Date:    possibly 10/03/2019              Expected Discharge Plan:  [place]  Discharge planning Services  CM Consult  Choice offered to:  Patient  HH Arranged:  RN, Disease Management, PT, Nurse's Aide    Status of Service:  In process, will continue to follow  Sherrilyn Rist [phone] 11/28/2018, 11:09 AM

## 2018-11-28 NOTE — Plan of Care (Signed)
  Problem: Activity: Goal: Capacity to carry out activities will improve Outcome: Progressing   Problem: Cardiac: Goal: Ability to achieve and maintain adequate cardiopulmonary perfusion will improve Outcome: Progressing   

## 2018-11-28 NOTE — Progress Notes (Addendum)
Advanced Heart Failure Rounding Note  PCP-Cardiologist: Quay Burow, MD   Subjective:    Admitted with massive volume overload (30 lb) in setting of dietary noncompliance.  Received 80 mg IV lasix + metolazone x2 yesterday. I/O -3.5 L. Weight only down 3 lbs. Creatinine stable 1.32. SBP 140-150s  WBC 13.3. Tmax 99.3. Hemoglobin 8.1. Denies bleeding  She feels okay. No SOB walking around room. Sitting in chair. +cough with white sputum. Having a lot of pain in LLE.   Objective:   Weight Range: 120.1 kg Body mass index is 48.43 kg/m.   Vital Signs:   Temp:  [98.1 F (36.7 C)-99.3 F (37.4 C)] 98.2 F (36.8 C) (01/15 0633) Pulse Rate:  [67-77] 69 (01/15 0633) Resp:  [16-20] 16 (01/15 6606) BP: (132-157)/(60-85) 141/74 (01/15 0633) SpO2:  [94 %-100 %] 96 % (01/15 3016) Weight:  [120.1 kg] 120.1 kg (01/15 0200) Last BM Date: 11/27/18  Weight change: Filed Weights   11/26/18 1602 11/27/18 0451 11/28/18 0200  Weight: 122 kg 121.5 kg 120.1 kg    Intake/Output:   Intake/Output Summary (Last 24 hours) at 11/28/2018 0723 Last data filed at 11/28/2018 0450 Gross per 24 hour  Intake 1560 ml  Output 5101 ml  Net -3541 ml      Physical Exam    General: Obese. No resp difficulty. HEENT: Normal Neck: Supple. JVP to jaw. Carotids 2+ bilat; no bruits. No thyromegaly or nodule noted. Cor: PMI nondisplaced. RRR, 2/6 TR Lungs: CTAB, normal effort. Abdomen: Soft, non-tender, +distended, no HSM. No bruits or masses. +BS  Extremities: No cyanosis, clubbing, or rash. R and LLE 2+ edema to knees, very tight with erythema. RLE wrapped in ace. Right foot with all toes amputated.  Neuro: Alert & orientedx3, cranial nerves grossly intact. moves all 4 extremities w/o difficulty. Affect pleasant   Telemetry   NSR 60-70s. Personally reviewed.   EKG    No new tracings.   Labs    CBC Recent Labs    11/27/18 0519 11/28/18 0459  WBC 12.6* 13.3*  NEUTROABS 9.8*  --     HGB 8.2* 8.1*  HCT 28.7* 27.7*  MCV 100.0 97.9  PLT 325 010   Basic Metabolic Panel Recent Labs    11/27/18 0519 11/28/18 0459  NA 142 141  K 3.5 4.6  CL 108 106  CO2 25 28  GLUCOSE 96 138*  BUN 29* 29*  CREATININE 1.33* 1.32*  CALCIUM 8.2* 8.5*   Liver Function Tests Recent Labs    11/27/18 0519  AST 27  ALT 45*   No results for input(s): LIPASE, AMYLASE in the last 72 hours. Cardiac Enzymes No results for input(s): CKTOTAL, CKMB, CKMBINDEX, TROPONINI in the last 72 hours.  BNP: BNP (last 3 results) Recent Labs    10/18/18 1109 10/21/18 0125 11/27/18 0519  BNP 410.0* 548.1* 511.3*    ProBNP (last 3 results) No results for input(s): PROBNP in the last 8760 hours.   D-Dimer No results for input(s): DDIMER in the last 72 hours. Hemoglobin A1C Recent Labs    11/27/18 0519  HGBA1C 6.3*   Fasting Lipid Panel No results for input(s): CHOL, HDL, LDLCALC, TRIG, CHOLHDL, LDLDIRECT in the last 72 hours. Thyroid Function Tests No results for input(s): TSH, T4TOTAL, T3FREE, THYROIDAB in the last 72 hours.  Invalid input(s): FREET3  Other results:   Imaging    Dg Chest Port 1 View  Result Date: 11/27/2018 CLINICAL DATA:  Congestive heart failure. EXAM: PORTABLE CHEST  1 VIEW COMPARISON:  10/21/2018 and 08/18/2017 FINDINGS: Heart size and pulmonary vascularity are within normal limits. There is slight haziness at the right base posterior medially of the lungs are otherwise clear. No discrete effusions. No appreciable bone abnormality. IMPRESSION: Probable slight atelectasis at the right lung base. No visible pulmonary edema. Electronically Signed   By: Lorriane Shire M.D.   On: 11/27/2018 12:23      Medications:     Scheduled Medications: . amiodarone  100 mg Oral Daily  . amLODipine  10 mg Oral Daily  . apixaban  5 mg Oral BID  . atorvastatin  40 mg Oral q1800  . furosemide  80 mg Intravenous Q12H  . insulin aspart  0-20 Units Subcutaneous TID WC   . insulin glargine  72 Units Subcutaneous QHS  . levothyroxine  50 mcg Oral Q0600  . losartan  100 mg Oral Daily  . metolazone  5 mg Oral BID  . multivitamin with minerals  1 tablet Oral Daily  . nicotine  21 mg Transdermal Daily  . potassium chloride  40 mEq Oral BID  . sodium chloride flush  3 mL Intravenous Q12H  . sodium chloride flush  3 mL Intravenous Q12H  . sodium chloride flush  3 mL Intravenous Q12H  . spironolactone  12.5 mg Oral Daily  . thiamine  100 mg Oral Daily  . traZODone  100 mg Oral QHS     Infusions: . sodium chloride    . sodium chloride    . sodium chloride       PRN Medications:  sodium chloride, sodium chloride, sodium chloride, acetaminophen, albuterol, ipratropium-albuterol, ondansetron (ZOFRAN) IV, sodium chloride flush, sodium chloride flush, sodium chloride flush, traMADol    Patient Profile   [name] is a 58 y.o. female with history of PAD, carotid stenosis s/p left CEA, relatively mild CAD, chronic systolic CHF (EF 62-69%), paroxysmal atrial fibrillation, PAD s/p atherectomy of right SFA 04/2017, hx of junctional bradycardia, OSA no CPAP, cirrhosis, COPD, and prior substance abuse.   Admitted from [facility] office with 30 lb weight gain in setting of diet and medication noncompliance.   Assessment/Plan   1. Acute on chronic systolic CHF: Echo 48/5462: EF 45-50% (improved from 40-45% 11/2016). Has been presumed nonischemic given LHC in 12/15 showing only 80% stenosis in small OM1. - Volume markedly overloaded in setting of medication and dietary noncompliance. Weight up 30 lbs from recent DC in December.  - Continue 80 mg IV lasix BID and metolazone 5 mg BID (received 2.5 and 5 mg yesterday) - Hold bisoprolol with massive volume overload.  - Increase losartan from 100 to 150 mg daily. SBP 140-150s - Increase spiro to 25 mg daily.  - RLE wrapped due to wounds. Continue ACE wrap to LLE (no unna boots with PAD) - RD has been consulted 2.  Atrial fibrillation: Paroxysmal.  - Continue amiodarone 100 mg daily. LFTs stable yesterday. TSH stable last month. PCP following hypothyroidism (on Levoxyl). Needs regular eye exams. - Continue eliquis 5 mg twice a day. Denies bleeding.  3. VOJ:[id number] on left seems limiting. No ulcerations. She has foot pain almost constantly. Not candidate for cilostazol with CHF. 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  - Peripheral angiography deferred with AKI. - Now has RLE wound. See below.  4. COPD: - Continues to smoke 1 ppd. Talked about cessation.  - Add doxy as below for ?wound infection. Will cover  for ?COPD exacerbation as well.  5. H/o cirrhosis:  - Likely from ETOH. LFTs stable yesterday.  6. OSA: - Unable to tolerate CPAP. no change.  7. CKD stage III:  - Monitor BMET daily. Creatinine stable 1.32. 8. HTN:  - SBP 140-150s. Increase spiro and losartan as above.  9. Anemia - Hemoglobin 8.1. Denies bleeding. Check hemoccult.  10. RLE wounds  - WOC RN consulted. Right leg has two full thickness stasis wounds on calf. Foam dressing applied with ACE wrap.  - WBC 13.3. Tmax 99.3 - Add doxy to cover for cellulitis   Medication concerns reviewed with patient and pharmacy team. Barriers identified: medication and dietary compliance.    Length of Stay: [place], NP  11/28/2018, 7:23 AM  Advanced Heart Failure Team Pager [phone] (M-F; 7a - 4p)  Please contact [facility] for night-coverage after hours (4p -7a ) and weekends on [email or web address]  Patient seen with NP, agree with the above note.    She diuresed well yesterday, still with significant volume overload. Breathing improving.  Legs wrapped.  Creatinine stable.   On exam, 2+ edema to knees with legs wrapped.  JVP 14 cm.  Regular S1S2.   Acute on chronic primarily diastolic CHF (EF 32-44% on 12/19 echo).  She remains markedly volume  overloaded.  - Continue Lasix 80 mg IV bid with metolazone 5 mg bid.  - Continue spironolactone and losartan (increased to 150 mg daily).  - She can go back on low dose bisoprolol as her EF is not markedly low and she is diuresing now.   She is maintaining NSR on amiodarone.  Continue apixaban.   Needs to quit smoking and drinking, discussed today.   Severe PAD, has bilateral lower leg ulcerations (may be venous stasis). I will get PV evaluation, discussed with Dr. Gwenlyn Found who will see her tomorrow.   Loralie Champagne 11/28/2018 3:59 PM

## 2018-11-29 LAB — CBC
HCT: 29 % — ABNORMAL LOW (ref 36.0–46.0)
Hemoglobin: 8.9 g/dL — ABNORMAL LOW (ref 12.0–15.0)
MCH: 29.8 pg (ref 26.0–34.0)
MCHC: 30.7 g/dL (ref 30.0–36.0)
MCV: 97 fL (ref 80.0–100.0)
Platelets: 386 10*3/uL (ref 150–400)
RBC: 2.99 MIL/uL — AB (ref 3.87–5.11)
RDW: 15.8 % — ABNORMAL HIGH (ref 11.5–15.5)
WBC: 14.2 10*3/uL — ABNORMAL HIGH (ref 4.0–10.5)
nRBC: 0 % (ref 0.0–0.2)

## 2018-11-29 LAB — GLUCOSE, CAPILLARY
GLUCOSE-CAPILLARY: 127 mg/dL — AB (ref 70–99)
Glucose-Capillary: 230 mg/dL — ABNORMAL HIGH (ref 70–99)
Glucose-Capillary: 130 mg/dL — ABNORMAL HIGH (ref 70–99)
Glucose-Capillary: 182 mg/dL — ABNORMAL HIGH (ref 70–99)

## 2018-11-29 LAB — IRON AND TIBC
IRON: 38 ug/dL (ref 28–170)
Saturation Ratios: 9 % — ABNORMAL LOW (ref 10.4–31.8)
TIBC: 441 ug/dL (ref 250–450)
UIBC: 403 ug/dL

## 2018-11-29 LAB — BASIC METABOLIC PANEL
Anion gap: 13 (ref 5–15)
BUN: 29 mg/dL — ABNORMAL HIGH (ref 6–20)
CO2: 28 mmol/L (ref 22–32)
Calcium: 9 mg/dL (ref 8.9–10.3)
Chloride: 101 mmol/L (ref 98–111)
Creatinine, Ser: 1.34 mg/dL — ABNORMAL HIGH (ref 0.44–1.00)
GFR calc Af Amer: 51 mL/min — ABNORMAL LOW (ref >60)
GFR, EST NON AFRICAN AMERICAN: 44 mL/min — AB (ref >60)
Glucose, Bld: 144 mg/dL — ABNORMAL HIGH (ref 70–99)
Potassium: 4.7 mmol/L (ref 3.5–5.1)
SODIUM: 142 mmol/L (ref 135–145)

## 2018-11-29 LAB — FERRITIN: Ferritin: 61 ng/mL (ref 11–307)

## 2018-11-29 MED ORDER — FUROSEMIDE 10 MG/ML IJ SOLN
80.0000 mg | Freq: Once | INTRAMUSCULAR | Status: AC
  Administered 2018-11-29: 80 mg via INTRAVENOUS

## 2018-11-29 MED ORDER — FUROSEMIDE 10 MG/ML IJ SOLN
12.0000 mg/h | INTRAVENOUS | Status: DC
  Administered 2018-11-29 – 2018-11-30 (×2): 12 mg/h via INTRAVENOUS
  Filled 2018-11-29: qty 25
  Filled 2018-11-29: qty 20

## 2018-11-29 MED ORDER — INSULIN ASPART 100 UNIT/ML ~~LOC~~ SOLN
0.0000 [IU] | Freq: Three times a day (TID) | SUBCUTANEOUS | Status: DC
  Administered 2018-11-30: 4 [IU] via SUBCUTANEOUS
  Administered 2018-11-30: 3 [IU] via SUBCUTANEOUS
  Administered 2018-12-01: 7 [IU] via SUBCUTANEOUS
  Administered 2018-12-01 (×2): 4 [IU] via SUBCUTANEOUS
  Administered 2018-12-02: 15 [IU] via SUBCUTANEOUS
  Administered 2018-12-02: 3 [IU] via SUBCUTANEOUS
  Administered 2018-12-03: 4 [IU] via SUBCUTANEOUS

## 2018-11-29 MED ORDER — METOLAZONE 5 MG PO TABS
5.0000 mg | ORAL_TABLET | Freq: Every day | ORAL | Status: DC
  Administered 2018-11-29: 5 mg via ORAL
  Filled 2018-11-29: qty 1

## 2018-11-29 NOTE — Plan of Care (Signed)
  Problem: Activity: Goal: Capacity to carry out activities will improve Outcome: Progressing   Problem: Cardiac: Goal: Ability to achieve and maintain adequate cardiopulmonary perfusion will improve Outcome: Progressing   Problem: Education: Goal: Knowledge of General Education information will improve Description: Including pain rating scale, medication(s)/side effects and non-pharmacologic comfort measures Outcome: Progressing   

## 2018-11-29 NOTE — Progress Notes (Signed)
Patient ID: [name], female   DOB: 01-21-1961, 58 y.o.   MRN: [id number]     Advanced Heart Failure Rounding Note  PCP-Cardiologist: Quay Burow, MD   Subjective:    Admitted with massive volume overload (30 lb) in setting of dietary noncompliance.  Received 80 mg IV lasix x 2 + metolazone x 2 again yesterday. Weight down 3 lbs again but I/Os not very negative. SBP 140s.  Creatinine stable today at 1.34.   WBC 14. Afebrile.  Hgb 8.9, no overt GI bleeding.   Breathing getting better but still with a lot of pain in her right foot/leg.    Objective:   Weight Range: 118.5 kg Body mass index is 47.79 kg/m.   Vital Signs:   Temp:  [98.2 F (36.8 C)-98.6 F (37 C)] 98.6 F (37 C) (01/16 0536) Pulse Rate:  [64-78] 78 (01/16 0536) Resp:  [18-20] 20 (01/16 0536) BP: (129-148)/(62-76) 147/76 (01/16 0536) SpO2:  [93 %-100 %] 94 % (01/16 0536) Weight:  [118.5 kg] 118.5 kg (01/16 0536) Last BM Date: 11/28/18  Weight change: Filed Weights   11/27/18 0451 11/28/18 0200 11/29/18 0536  Weight: 121.5 kg 120.1 kg 118.5 kg    Intake/Output:   Intake/Output Summary (Last 24 hours) at 11/29/2018 0808 Last data filed at 11/29/2018 0500 Gross per 24 hour  Intake 1460 ml  Output 2350 ml  Net -890 ml      Physical Exam    General: NAD Neck: JVP 14 cm, no thyromegaly or thyroid nodule.  Lungs: Crackles at bases.  CV: Nondisplaced PMI.  Heart regular S1/S2, no S3/S4, no murmur.  2+ edema to knees bilaterally.    Abdomen: Soft, nontender, no hepatosplenomegaly, no distention.  Skin: Intact without lesions or rashes.  Neurologic: Alert and oriented x 3.  Psych: Normal affect. Extremities: No clubbing or cyanosis. Lower legs wrapped.  HEENT: Normal.   Telemetry   NSR 60-70s. Personally reviewed.   EKG    No new tracings.   Labs    CBC Recent Labs    11/27/18 0519 11/28/18 0459 11/29/18 0543  WBC 12.6* 13.3* 14.2*  NEUTROABS 9.8*  --   --   HGB 8.2* 8.1* 8.9*    HCT 28.7* 27.7* 29.0*  MCV 100.0 97.9 97.0  PLT 325 325 211   Basic Metabolic Panel Recent Labs    11/27/18 0519 11/28/18 0459  NA 142 141  K 3.5 4.6  CL 108 106  CO2 25 28  GLUCOSE 96 138*  BUN 29* 29*  CREATININE 1.33* 1.32*  CALCIUM 8.2* 8.5*   Liver Function Tests Recent Labs    11/27/18 0519  AST 27  ALT 45*   No results for input(s): LIPASE, AMYLASE in the last 72 hours. Cardiac Enzymes No results for input(s): CKTOTAL, CKMB, CKMBINDEX, TROPONINI in the last 72 hours.  BNP: BNP (last 3 results) Recent Labs    10/18/18 1109 10/21/18 0125 11/27/18 0519  BNP 410.0* 548.1* 511.3*    ProBNP (last 3 results) No results for input(s): PROBNP in the last 8760 hours.   D-Dimer No results for input(s): DDIMER in the last 72 hours. Hemoglobin A1C Recent Labs    11/27/18 0519  HGBA1C 6.3*   Fasting Lipid Panel No results for input(s): CHOL, HDL, LDLCALC, TRIG, CHOLHDL, LDLDIRECT in the last 72 hours. Thyroid Function Tests No results for input(s): TSH, T4TOTAL, T3FREE, THYROIDAB in the last 72 hours.  Invalid input(s): FREET3  Other results:   Imaging  No results found.   Medications:     Scheduled Medications: . amiodarone  100 mg Oral Daily  . amLODipine  10 mg Oral Daily  . apixaban  5 mg Oral BID  . atorvastatin  40 mg Oral q1800  . bisoprolol  5 mg Oral Daily  . doxycycline  100 mg Oral Q12H  . furosemide  80 mg Intravenous BID  . insulin aspart  0-20 Units Subcutaneous TID WC  . insulin glargine  72 Units Subcutaneous QHS  . levothyroxine  50 mcg Oral Q0600  . losartan  150 mg Oral Daily  . metolazone  5 mg Oral BID  . multivitamin with minerals  1 tablet Oral Daily  . nicotine  21 mg Transdermal Daily  . potassium chloride  40 mEq Oral Daily  . sodium chloride flush  3 mL Intravenous Q12H  . sodium chloride flush  3 mL Intravenous Q12H  . spironolactone  25 mg Oral Daily  . thiamine  100 mg Oral Daily  . traZODone  100 mg  Oral QHS    Infusions: . sodium chloride    . sodium chloride    . sodium chloride      PRN Medications: sodium chloride, sodium chloride, sodium chloride, acetaminophen, albuterol, ipratropium-albuterol, ondansetron (ZOFRAN) IV, sodium chloride flush, sodium chloride flush, traMADol    Patient Profile   [name] is a 58 y.o. female with history of PAD, carotid stenosis s/p left CEA, relatively mild CAD, chronic systolic CHF (EF 67-61%), paroxysmal atrial fibrillation, PAD s/p atherectomy of right SFA 04/2017, hx of junctional bradycardia, OSA no CPAP, cirrhosis, COPD, and prior substance abuse.   Admitted from [facility] office with 30 lb weight gain in setting of diet and medication noncompliance.   Assessment/Plan   1. Acute on chronic systolic CHF: Echo 95/0932 with EF 45-50% (improved from 40-45% 11/2016). Has been presumed nonischemic given LHC in 12/15 showing only 80% stenosis in small OM1.Admitted with volume markedly overloaded in setting of medication and dietary noncompliance. Weight up 30 lbs from recent DC in December.  Some diuresis so far, but UOP recorded yesterday was not vigorous.  Creatinine stable at 1.34. She remains volume overloaded on exam.  - She can get Lasix 80 mg IV x 1 today then start Lasix gtt at 12 mg/hr.  Metolazone 5 mg daily. K is 4.7.  - Continue bisoprolol 5 mg daily.   - Continue losartan 150 mg daily.  - Continue spironolactone 25 mg daily.  2. Atrial fibrillation: Paroxysmal. NSR today.  - Continue amiodarone 100 mg daily. LFTs stable yesterday. TSH stable last month. PCP following hypothyroidism (on Levoxyl). Needs regular eye exams. - Continue eliquis 5 mg twice a day. Hgb low but no overt bleeding.  3. IZT:[id number] bilaterally. She has foot pain almost constantly. Not candidate for cilostazol with CHF. 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  She has lower  extremity wounds that are wrapped.  - Wound care following.  - I have asked for PV evaluation by Dr. Gwenlyn Found, he will see today.  4. COPD: Continues to smoke 1 ppd. Talked about cessation.  5. H/o cirrhosis:  Likely from ETOH. LFTs stable.  - I urged her to stop drinking.  6. OSA: Unable to tolerate CPAP. no change.  7. CKD stage III: Creatinine stable at 1.34 so far with diuresis.  8. HTN: SBP 140s, have increased meds this admission.   9. Anemia: Hgb low  but stable.  Denies overt bleeding.  - Check hemoccult.  - Fe studies.  10. RLE wounds: WOC RN consulted. Right leg has two full thickness stasis wounds on calf. Foam dressing applied with ACE wrap.  - On doxycycline to cover for cellulitis   Length of Stay: 3  Loralie Champagne, MD  11/29/2018, 8:08 AM  Advanced Heart Failure Team Pager [phone] (M-F; 7a - 4p)  Please contact [name] for night-coverage after hours (4p -7a ) and weekends on [email or web address]

## 2018-11-30 LAB — CBC
HCT: 29 % — ABNORMAL LOW (ref 36.0–46.0)
Hemoglobin: 8.6 g/dL — ABNORMAL LOW (ref 12.0–15.0)
MCH: 28 pg (ref 26.0–34.0)
MCHC: 29.7 g/dL — ABNORMAL LOW (ref 30.0–36.0)
MCV: 94.5 fL (ref 80.0–100.0)
Platelets: 347 10*3/uL (ref 150–400)
RBC: 3.07 MIL/uL — ABNORMAL LOW (ref 3.87–5.11)
RDW: 15.8 % — AB (ref 11.5–15.5)
WBC: 12.2 10*3/uL — ABNORMAL HIGH (ref 4.0–10.5)
nRBC: 0 % (ref 0.0–0.2)

## 2018-11-30 LAB — BASIC METABOLIC PANEL
Anion gap: 12 (ref 5–15)
BUN: 34 mg/dL — ABNORMAL HIGH (ref 6–20)
CO2: 32 mmol/L (ref 22–32)
Calcium: 9.4 mg/dL (ref 8.9–10.3)
Chloride: 95 mmol/L — ABNORMAL LOW (ref 98–111)
Creatinine, Ser: 1.63 mg/dL — ABNORMAL HIGH (ref 0.44–1.00)
GFR calc Af Amer: 40 mL/min — ABNORMAL LOW (ref >60)
GFR, EST NON AFRICAN AMERICAN: 35 mL/min — AB (ref >60)
Glucose, Bld: 107 mg/dL — ABNORMAL HIGH (ref 70–99)
Potassium: 4.4 mmol/L (ref 3.5–5.1)
SODIUM: 139 mmol/L (ref 135–145)

## 2018-11-30 LAB — GLUCOSE, CAPILLARY
Glucose-Capillary: 98 mg/dL (ref 70–99)
Glucose-Capillary: 178 mg/dL — ABNORMAL HIGH (ref 70–99)
Glucose-Capillary: 132 mg/dL — ABNORMAL HIGH (ref 70–99)
Glucose-Capillary: 205 mg/dL — ABNORMAL HIGH (ref 70–99)

## 2018-11-30 MED ORDER — TORSEMIDE 20 MG PO TABS
40.0000 mg | ORAL_TABLET | Freq: Two times a day (BID) | ORAL | Status: DC
  Administered 2018-11-30 – 2018-12-01 (×3): 40 mg via ORAL
  Filled 2018-11-30 (×3): qty 2

## 2018-11-30 MED ORDER — FOLIC ACID 1 MG PO TABS
1.0000 mg | ORAL_TABLET | Freq: Every day | ORAL | Status: DC
  Administered 2018-12-01 – 2018-12-03 (×3): 1 mg via ORAL
  Filled 2018-11-30 (×3): qty 1

## 2018-11-30 MED ORDER — SODIUM CHLORIDE 0.9 % IV SOLN
510.0000 mg | Freq: Once | INTRAVENOUS | Status: AC
  Administered 2018-11-30: 510 mg via INTRAVENOUS
  Filled 2018-11-30: qty 17

## 2018-11-30 NOTE — Plan of Care (Signed)
  Problem: Education: Goal: Ability to demonstrate management of disease process will improve Outcome: Progressing Goal: Ability to verbalize understanding of medication therapies will improve Outcome: Progressing Goal: Individualized Educational Video(s) Outcome: Progressing   Problem: Activity: Goal: Capacity to carry out activities will improve Outcome: Progressing   Problem: Cardiac: Goal: Ability to achieve and maintain adequate cardiopulmonary perfusion will improve Outcome: Progressing   Problem: Spiritual Needs Goal: Ability to function at adequate level Outcome: Progressing   Problem: Education: Goal: Knowledge of General Education information will improve Description Including pain rating scale, medication(s)/side effects and non-pharmacologic comfort measures Outcome: Progressing

## 2018-11-30 NOTE — Evaluation (Signed)
Physical Therapy Evaluation Patient Details Name: [name] MRN: [id number] DOB: 1961/09/25 Today's Date: 11/30/2018   History of Present Illness  Pt is a 58 y.o. female admitted with acute on chronic CHF. Patient also with R LE cellulitis.  PMH includes DM, R transmetatarsal amputation, HTN, PAD, HLD, Afib on Eliquis, CHF, COPD, CKD, peripheral neuropathy, left carotid endarterectomy.    Clinical Impression  Patient received in recliner, reporting significant pain in right LE. Patient agrees to PT evaluation. Patient performs sit to stand transfer with supervision, ambulated 30 feet with rw, limited by pain in R LE which increased with walking. Requires further PT to encourage increased mobility and to improve strength for return home at discharge.       Follow Up Recommendations Home health PT    Equipment Recommendations  None recommended by PT    Recommendations for Other Services       Precautions / Restrictions Precautions Precautions: Fall Restrictions Weight Bearing Restrictions: No      Mobility  Bed Mobility               General bed mobility comments: Not assessed, patient up in recliner upone entering room and returned to recliner.   Transfers Overall transfer level: Modified independent Equipment used: Rolling walker (2 wheeled)                Ambulation/Gait Ambulation/Gait assistance: Modified independent (Device/Increase time);Supervision Gait Distance (Feet): 30 Feet Assistive device: Rolling walker (2 wheeled) Gait Pattern/deviations: Step-through pattern;Decreased stance time - right;Shuffle Gait velocity: decreased   General Gait Details: steady ambulation, no lob , limited by pain in R LE  Stairs            Wheelchair Mobility    Modified Rankin (Stroke Patients Only)       Balance Overall balance assessment: Modified Independent                                           Pertinent Vitals/Pain  Pain Assessment: Faces Faces Pain Scale: Hurts even more Pain Descriptors / Indicators: Aching;Jabbing;Constant;Tightness Pain Intervention(s): Limited activity within patient's tolerance;Monitored during session    Home Living Family/patient expects to be discharged to:: Private residence Living Arrangements: Other relatives Available Help at Discharge: Available PRN/intermittently Type of Home: Apartment Home Access: Stairs to enter Entrance Stairs-Rails: Right Entrance Stairs-Number of Steps: 2 flights Home Layout: One level Home Equipment: Environmental consultant - 4 wheels;Wheelchair - Brewing technologist;Adaptive equipment Additional Comments: pt lives with cousin in second story apt, they have been trying to get a ground level apt    Prior Function Level of Independence: Independent with assistive device(s)         Comments: States she has had 3 falls in past 6 months, sister can also help PRN     Hand Dominance   Dominant Hand: Left    Extremity/Trunk Assessment   Upper Extremity Assessment Upper Extremity Assessment: Overall WFL for tasks assessed    Lower Extremity Assessment Lower Extremity Assessment: Overall WFL for tasks assessed;Generalized weakness       Communication   Communication: No difficulties  Cognition Arousal/Alertness: Awake/alert Behavior During Therapy: WFL for tasks assessed/performed Overall Cognitive Status: Within Functional Limits for tasks assessed  General Comments      Exercises Total Joint Exercises Ankle Circles/Pumps: AROM;10 reps Long Arc Quad: AROM;10 reps   Assessment/Plan    PT Assessment Patient needs continued PT services  PT Problem List Decreased strength;Decreased mobility;Decreased activity tolerance;Impaired sensation;Pain       PT Treatment Interventions Functional mobility training;Balance training;Gait training;Therapeutic activities;Therapeutic exercise;Stair  training;Neuromuscular re-education;Patient/family education    PT Goals (Current goals can be found in the Care Plan section)  Acute Rehab PT Goals Patient Stated Goal: to return home PT Goal Formulation: With patient Time For Goal Achievement: 12/14/18 Potential to Achieve Goals: Good    Frequency Min 3X/week   Barriers to discharge        Co-evaluation               AM-PAC PT "6 Clicks" Mobility  Outcome Measure Help needed turning from your back to your side while in a flat bed without using bedrails?: A Little Help needed moving from lying on your back to sitting on the side of a flat bed without using bedrails?: A Little Help needed moving to and from a bed to a chair (including a wheelchair)?: A Little Help needed standing up from a chair using your arms (e.g., wheelchair or bedside chair)?: A Little Help needed to walk in hospital room?: A Little Help needed climbing 3-5 steps with a railing? : A Little 6 Click Score: 18    End of Session Equipment Utilized During Treatment: Gait belt Activity Tolerance: Patient limited by pain Patient left: in chair Nurse Communication: Mobility status PT Visit Diagnosis: Muscle weakness (generalized) (M62.81);Difficulty in walking, not elsewhere classified (R26.2);Pain Pain - Right/Left: Right Pain - part of body: Leg    Time: 1050-1110 PT Time Calculation (min) (ACUTE ONLY): 20 min   Charges:   PT Evaluation $PT Eval Low Complexity: 1 Low          Affan Callow, PT, GCS 11/30/18,11:29 AM

## 2018-11-30 NOTE — Progress Notes (Signed)
CARDIAC REHAB PHASE I   Heart failure education reviewed with pt. Pt given HF booklet, low sodium and  diabetic diets, smoking cessation tip sheet, and exercise guidelines. Pt verbalizes need for decreasing salt intake, eliminating soda, and quitting smoking. Pt states she has quit in the past for 8 months but everyone around her smokes and its "something to do." Encouraged mobility as able with pt, pt states she doesn't like to walk because it hurts her legs. Pt states she has a rollator, encouraged pt to walk, rest, and get up and walk again. Pt states she "knows" what she needs to do, but she just hasn't done it. PT working with pt on ambulation, will follow and reinforce education and ambulation.  [id number] Rufina Falco, RN BSN 11/30/2018 11:07 AM

## 2018-11-30 NOTE — Progress Notes (Addendum)
Patient ID: [name], female   DOB: 10/30/1961, 58 y.o.   MRN: [id number]     Advanced Heart Failure Rounding Note  PCP-Cardiologist: Quay Burow, MD   Subjective:    Admitted with volume overload/dyspnea.   She diuresed well yesterday on Lasix gtt + metolazone.  Weight down about 17 lbs total now.  Creatinine up to 1.63.   Hgb 8.6, no overt GI bleeding, iron stores low.   Breathing much improved but still with a lot of pain in her right foot/leg.   Objective:   Weight Range: 114.5 kg Body mass index is 46.16 kg/m.   Vital Signs:   Temp:  [98.2 F (36.8 C)-98.4 F (36.9 C)] 98.2 F (36.8 C) (01/16 2011) Pulse Rate:  [68-74] 68 (01/16 2011) Resp:  [18] 18 (01/16 2011) BP: (137-152)/(67-72) 137/67 (01/16 2011) SpO2:  [95 %-97 %] 95 % (01/16 2011) Weight:  [114.5 kg] 114.5 kg (01/16 2011) Last BM Date: 11/28/18  Weight change: Filed Weights   11/28/18 0200 11/29/18 0536 11/29/18 2011  Weight: 120.1 kg 118.5 kg 114.5 kg    Intake/Output:   Intake/Output Summary (Last 24 hours) at 11/30/2018 0834 Last data filed at 11/30/2018 0600 Gross per 24 hour  Intake 1075.92 ml  Output 5300 ml  Net -4224.08 ml      Physical Exam    General: NAD Neck: Thick, JVP 8 cm, no thyromegaly or thyroid nodule.  Lungs: Clear to auscultation bilaterally with normal respiratory effort. CV: Nondisplaced PMI.  Heart regular S1/S2, no S3/S4, no murmur.  Trace lower leg edema.   Abdomen: Soft, nontender, no hepatosplenomegaly, no distention.  Skin: Intact without lesions or rashes.  Neurologic: Alert and oriented x 3.  Psych: Normal affect. Extremities: No clubbing or cyanosis. 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  HEENT: Normal.    Telemetry   NSR 60-70s. Personally reviewed.   EKG    No new tracings.   Labs    CBC Recent Labs    11/29/18 0543 11/30/18 0402  WBC 14.2* 12.2*  HGB 8.9* 8.6*  HCT 29.0* 29.0*  MCV 97.0 94.5  PLT  386 833   Basic Metabolic Panel Recent Labs    11/29/18 0543 11/30/18 0402  NA 142 139  K 4.7 4.4  CL 101 95*  CO2 28 32  GLUCOSE 144* 107*  BUN 29* 34*  CREATININE 1.34* 1.63*  CALCIUM 9.0 9.4   Liver Function Tests No results for input(s): AST, ALT, ALKPHOS, BILITOT, PROT, ALBUMIN in the last 72 hours. No results for input(s): LIPASE, AMYLASE in the last 72 hours. Cardiac Enzymes No results for input(s): CKTOTAL, CKMB, CKMBINDEX, TROPONINI in the last 72 hours.  BNP: BNP (last 3 results) Recent Labs    10/18/18 1109 10/21/18 0125 11/27/18 0519  BNP 410.0* 548.1* 511.3*    ProBNP (last 3 results) No results for input(s): PROBNP in the last 8760 hours.   D-Dimer No results for input(s): DDIMER in the last 72 hours. Hemoglobin A1C No results for input(s): HGBA1C in the last 72 hours. Fasting Lipid Panel No results for input(s): CHOL, HDL, LDLCALC, TRIG, CHOLHDL, LDLDIRECT in the last 72 hours. Thyroid Function Tests No results for input(s): TSH, T4TOTAL, T3FREE, THYROIDAB in the last 72 hours.  Invalid input(s): FREET3  Other results:   Imaging    No results found.   Medications:     Scheduled Medications: . amiodarone  100 mg Oral Daily  . amLODipine  10 mg Oral  Daily  . apixaban  5 mg Oral BID  . atorvastatin  40 mg Oral q1800  . bisoprolol  5 mg Oral Daily  . doxycycline  100 mg Oral Q12H  . insulin aspart  0-20 Units Subcutaneous TID WC  . insulin glargine  72 Units Subcutaneous QHS  . levothyroxine  50 mcg Oral Q0600  . losartan  150 mg Oral Daily  . multivitamin with minerals  1 tablet Oral Daily  . nicotine  21 mg Transdermal Daily  . potassium chloride  40 mEq Oral Daily  . sodium chloride flush  3 mL Intravenous Q12H  . sodium chloride flush  3 mL Intravenous Q12H  . spironolactone  25 mg Oral Daily  . thiamine  100 mg Oral Daily  . torsemide  40 mg Oral BID  . traZODone  100 mg Oral QHS    Infusions: . sodium chloride    .  sodium chloride    . sodium chloride    . ferumoxytol      PRN Medications: sodium chloride, sodium chloride, sodium chloride, acetaminophen, albuterol, ipratropium-albuterol, ondansetron (ZOFRAN) IV, sodium chloride flush, sodium chloride flush, traMADol    Patient Profile   [name] is a 58 y.o. female with history of PAD, carotid stenosis s/p left CEA, relatively mild CAD, chronic systolic CHF (EF 86-76%), paroxysmal atrial fibrillation, PAD s/p atherectomy of right SFA 04/2017, hx of junctional bradycardia, OSA no CPAP, cirrhosis, COPD, and prior substance abuse.   Admitted from [facility] office with 30 lb weight gain in setting of diet and medication noncompliance.   Assessment/Plan   1. Acute on chronic systolic CHF: Echo 19/5093 with EF 45-50% (improved from 40-45% 11/2016). Has been presumed nonischemic given LHC in 12/15 showing only 80% stenosis in small OM1.Admitted with volume markedly overloaded in setting of medication and dietary noncompliance. Weight up 30 lbs from recent DC in December.  Diuresed well yesterday with Lasix gtt + metolazone, weight now down about 17 lbs.  Creatinine up to 1.63.  On exam, she does not look particularly volume overloaded at this point.  - Stop IV Lasix/metolazone, start torsemide 40 mg po bid (had been on torsemide 40 mg once daily at home).  - Continue bisoprolol 5 mg daily.   - Continue losartan 150 mg daily.  - Continue spironolactone 25 mg daily.  2. Atrial fibrillation: Paroxysmal. NSR today.  - Continue amiodarone 100 mg daily. LFTs stable yesterday. TSH stable last month. PCP following hypothyroidism (on Levoxyl). Needs regular eye exams. - Continue eliquis 5 mg twice a day. Hgb low but no overt bleeding.  3. OIZ:[id number] bilaterally. She has right foot pain almost constantly. Not candidate for cilostazol with CHF. 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   She has lower extremity wounds that are wrapped.  Seen by Dr. Gwenlyn Found, no options for right leg below the knee revascularization.  Suspect some of the pain is neuropathic, but she has not been able to tolerate Lyrica or gabapentin due to tremors.  - Wound care following.  - Followup with Dr. Gwenlyn Found as outpatient.  - Will likely need pain clinic referral as outpatient.   4. COPD: Continues to smoke 1 ppd. Talked about cessation.  5. H/o cirrhosis:  Likely from ETOH. LFTs stable.  - I urged her to stop drinking.  6. OSA: Unable to tolerate CPAP. no change.  7. CKD stage III: Creatinine higher, volume better.  Transition to po  diuretics.   8. HTN: SBP 130s, have increased meds this admission.   9. Anemia: Hgb low but stable.  Denies overt bleeding. Fe deficiency anemia.  - Check hemoccult.  - Will give feraheme.   10. RLE wounds: WOC RN consulted. Right leg has two full thickness stasis wounds on calf. Foam dressing applied with ACE wrap.  - On doxycycline to cover for cellulitis   Need to mobilize, hopefully home soon. Would get paramedicine for her.   Length of Stay: Viera West, MD  11/30/2018, 8:34 AM  Advanced Heart Failure Team Pager [phone] (M-F; 7a - 4p)  Please contact [facility] for night-coverage after hours (4p -7a ) and weekends on [email or web address]

## 2018-12-01 DIAGNOSIS — N183 Chronic kidney disease, stage 3 (moderate): Secondary | ICD-10-CM

## 2018-12-01 DIAGNOSIS — I480 Paroxysmal atrial fibrillation: Secondary | ICD-10-CM

## 2018-12-01 DIAGNOSIS — I5023 Acute on chronic systolic (congestive) heart failure: Secondary | ICD-10-CM

## 2018-12-01 LAB — BASIC METABOLIC PANEL
Anion gap: 14 (ref 5–15)
BUN: 48 mg/dL — ABNORMAL HIGH (ref 6–20)
CO2: 29 mmol/L (ref 22–32)
Calcium: 9 mg/dL (ref 8.9–10.3)
Chloride: 93 mmol/L — ABNORMAL LOW (ref 98–111)
Creatinine, Ser: 1.85 mg/dL — ABNORMAL HIGH (ref 0.44–1.00)
GFR calc Af Amer: 35 mL/min — ABNORMAL LOW (ref >60)
GFR, EST NON AFRICAN AMERICAN: 30 mL/min — AB (ref >60)
GLUCOSE: 202 mg/dL — AB (ref 70–99)
Potassium: 4.7 mmol/L (ref 3.5–5.1)
Sodium: 136 mmol/L (ref 135–145)

## 2018-12-01 LAB — GLUCOSE, CAPILLARY
GLUCOSE-CAPILLARY: 174 mg/dL — AB (ref 70–99)
Glucose-Capillary: 187 mg/dL — ABNORMAL HIGH (ref 70–99)
Glucose-Capillary: 202 mg/dL — ABNORMAL HIGH (ref 70–99)
Glucose-Capillary: 178 mg/dL — ABNORMAL HIGH (ref 70–99)

## 2018-12-01 LAB — CBC
HCT: 29.2 % — ABNORMAL LOW (ref 36.0–46.0)
Hemoglobin: 8.9 g/dL — ABNORMAL LOW (ref 12.0–15.0)
MCH: 28.9 pg (ref 26.0–34.0)
MCHC: 30.5 g/dL (ref 30.0–36.0)
MCV: 94.8 fL (ref 80.0–100.0)
Platelets: 326 10*3/uL (ref 150–400)
RBC: 3.08 MIL/uL — ABNORMAL LOW (ref 3.87–5.11)
RDW: 15.5 % (ref 11.5–15.5)
WBC: 9.6 10*3/uL (ref 4.0–10.5)
nRBC: 0 % (ref 0.0–0.2)

## 2018-12-01 MED ORDER — ALUM & MAG HYDROXIDE-SIMETH 200-200-20 MG/5ML PO SUSP
30.0000 mL | ORAL | Status: DC | PRN
  Administered 2018-12-01: 30 mL via ORAL
  Filled 2018-12-01: qty 30

## 2018-12-01 NOTE — Evaluation (Signed)
Occupational Therapy Evaluation Patient Details Name: [name] MRN: [id number] DOB: Jun 02, 1961 Today's Date: 12/01/2018    History of Present Illness Pt is a 58 y.o. female admitted with acute on chronic CHF. Patient also with R LE cellulitis.  PMH includes DM, R transmetatarsal amputation, HTN, PAD, HLD, Afib on Eliquis, CHF, COPD, CKD, peripheral neuropathy, left carotid endarterectomy.   Clinical Impression   Pt is Mod I with mobility using RW, independent with UB ADLs, grooming and sup with LB ADLs. Pt reports that she has difficulty with toileting hygiene after BMs at baseline.  Educated pt on toileting aids for home use. All education completed and no further acute OT indicated at this time    Follow Up Recommendations  No OT follow up;Supervision - Intermittent    Equipment Recommendations  Other (comment)(toileting aid)    Recommendations for Other Services       Precautions / Restrictions Precautions Precautions: Fall Restrictions Weight Bearing Restrictions: No      Mobility Bed Mobility               General bed mobility comments: pt up in recliner upon arrival  Transfers Overall transfer level: Modified independent Equipment used: Rolling walker (2 wheeled)                  Balance Overall balance assessment: Modified Independent                                         ADL either performed or assessed with clinical judgement   ADL Overall ADL's : Needs assistance/impaired Eating/Feeding: Independent   Grooming: Wash/dry hands;Wash/dry face;Standing;Independent   Upper Body Bathing: Independent   Lower Body Bathing: Supervison/ safety   Upper Body Dressing : Independent       Toilet Transfer: Modified Independent;Ambulation;RW;Comfort height toilet;Grab bars   Toileting- Clothing Manipulation and Hygiene: Minimal assistance Toileting - Clothing Manipulation Details (indicate cue type and reason): pt reports she  has difficulty with hgyiene after BMs at baseline. Independent with clothing mgt, min A with hgyiene Tub/ Shower Transfer: Modified independent   Functional mobility during ADLs: Modified independent;Rolling walker General ADL Comments: pt reports she has difficulty with hgyiene after BMs at baseline. Educated pt on toileting aids     Vision Patient Visual Report: No change from baseline       Perception     Praxis      Pertinent Vitals/Pain Pain Assessment: 0-10 Pain Score: 7  Pain Location: R LE/foot Pain Descriptors / Indicators: Aching;Constant;Crushing Pain Intervention(s): Limited activity within patient's tolerance;Monitored during session;Premedicated before session;Repositioned     Hand Dominance Right   Extremity/Trunk Assessment Upper Extremity Assessment Upper Extremity Assessment: Overall WFL for tasks assessed   Lower Extremity Assessment Lower Extremity Assessment: Defer to PT evaluation   Cervical / Trunk Assessment Cervical / Trunk Assessment: Normal   Communication Communication Communication: No difficulties   Cognition Arousal/Alertness: Awake/alert Behavior During Therapy: WFL for tasks assessed/performed Overall Cognitive Status: Within Functional Limits for tasks assessed                                     General Comments       Exercises     Shoulder Instructions      Home Living Family/patient expects to be discharged to:: Private residence  Living Arrangements: Other relatives Available Help at Discharge: Available PRN/intermittently Type of Home: Apartment Home Access: Stairs to enter Entrance Stairs-Number of Steps: 2 flights Entrance Stairs-Rails: Right Home Layout: One level     Bathroom Shower/Tub: Teacher, early years/pre: Standard     Home Equipment: Environmental consultant - 4 wheels;Wheelchair - Brewing technologist;Adaptive equipment Adaptive Equipment: Reacher Additional Comments: pt lives with cousin in  second story apt, they have been trying to get a ground level apt      Prior Functioning/Environment Level of Independence: Independent with assistive device(s)        Comments: States she has had 3 falls in past 6 months, sister can also help PRN        OT Problem List: Decreased activity tolerance;Pain      OT Treatment/Interventions: Self-care/ADL training    OT Goals(Current goals can be found in the care plan section) Acute Rehab OT Goals Patient Stated Goal: to return home OT Goal Formulation: All assessment and education complete, DC therapy  OT Frequency:     Barriers to D/C:    no barriers       Co-evaluation              AM-PAC OT "6 Clicks" Daily Activity     Outcome Measure Help from another person eating meals?: None Help from another person taking care of personal grooming?: None Help from another person toileting, which includes using toliet, bedpan, or urinal?: None Help from another person bathing (including washing, rinsing, drying)?: None Help from another person to put on and taking off regular upper body clothing?: None Help from another person to put on and taking off regular lower body clothing?: None 6 Click Score: 24   End of Session Equipment Utilized During Treatment: Rolling walker  Activity Tolerance: Patient tolerated treatment well Patient left: in chair;with call bell/phone within reach  OT Visit Diagnosis: Pain Pain - Right/Left: Right Pain - part of body: Ankle and joints of foot                Time: 4035-2481 OT Time Calculation (min): 26 min Charges:  OT General Charges $OT Visit: 1 Visit OT Evaluation $OT Eval Low Complexity: 1 Low OT Treatments $Self Care/Home Management : 8-22 mins    Emmit Alexanders [facility] 12/01/2018, 11:21 AM

## 2018-12-01 NOTE — Progress Notes (Signed)
Patient ID: [name], female   DOB: 01/28/1961, 58 y.o.   MRN: [id number]     Advanced Heart Failure Rounding Note  PCP-Cardiologist: Quay Burow, MD   Subjective:    Breathing is improving  Still wheezing   No CP      Objective:   Weight Range: 112.4 kg Body mass index is 45.3 kg/m.   Vital Signs:   Temp:  [98.5 F (36.9 C)-98.6 F (37 C)] 98.5 F (36.9 C) (01/18 0636) Pulse Rate:  [63-78] 78 (01/18 0636) Resp:  [18-20] 20 (01/18 0636) BP: (103-139)/(51-67) 134/55 (01/18 0636) SpO2:  [94 %-98 %] 94 % (01/18 0636) Weight:  [112.4 kg] 112.4 kg (01/18 0448) Last BM Date: 11/28/18  Weight change: Filed Weights   11/29/18 0536 11/29/18 2011 12/01/18 0448  Weight: 118.5 kg 114.5 kg 112.4 kg    Intake/Output:   Intake/Output Summary (Last 24 hours) at 12/01/2018 1015 Last data filed at 12/01/2018 0900 Gross per 24 hour  Intake 651.52 ml  Output 3601 ml  Net -2949.48 ml    NET NEG 12.2 L    Physical Exam    General: NAD Neck: Neck full  Lungs: Crackles at bases.   Wheezes bilaterally   CV:  RRR   No S3   Abdomen: Soft, nontender, no hepatosplenomegaly, no distention.  Skin: Intact without lesions or rashes.  Neurologic: Alert and oriented x 3.  Psych: Normal affect. Extremities: 2+ edema with erythema   HEENT: Normal.   Telemetry   SR   Personally reviewed.   EKG    No new tracings.   Labs    CBC Recent Labs    11/30/18 0402 12/01/18 0542  WBC 12.2* 9.6  HGB 8.6* 8.9*  HCT 29.0* 29.2*  MCV 94.5 94.8  PLT 347 433   Basic Metabolic Panel Recent Labs    11/30/18 0402 12/01/18 0542  NA 139 136  K 4.4 4.7  CL 95* 93*  CO2 32 29  GLUCOSE 107* 202*  BUN 34* 48*  CREATININE 1.63* 1.85*  CALCIUM 9.4 9.0   Liver Function Tests No results for input(s): AST, ALT, ALKPHOS, BILITOT, PROT, ALBUMIN in the last 72 hours. No results for input(s): LIPASE, AMYLASE in the last 72 hours. Cardiac Enzymes No results for input(s): CKTOTAL,  CKMB, CKMBINDEX, TROPONINI in the last 72 hours.  BNP: BNP (last 3 results) Recent Labs    10/18/18 1109 10/21/18 0125 11/27/18 0519  BNP 410.0* 548.1* 511.3*    ProBNP (last 3 results) No results for input(s): PROBNP in the last 8760 hours.   D-Dimer No results for input(s): DDIMER in the last 72 hours. Hemoglobin A1C No results for input(s): HGBA1C in the last 72 hours. Fasting Lipid Panel No results for input(s): CHOL, HDL, LDLCALC, TRIG, CHOLHDL, LDLDIRECT in the last 72 hours. Thyroid Function Tests No results for input(s): TSH, T4TOTAL, T3FREE, THYROIDAB in the last 72 hours.  Invalid input(s): FREET3  Other results:   Imaging    No results found.   Medications:     Scheduled Medications: . amiodarone  100 mg Oral Daily  . amLODipine  10 mg Oral Daily  . apixaban  5 mg Oral BID  . atorvastatin  40 mg Oral q1800  . bisoprolol  5 mg Oral Daily  . doxycycline  100 mg Oral Q12H  . folic acid  1 mg Oral Daily  . insulin aspart  0-20 Units Subcutaneous TID WC  . insulin glargine  72 Units Subcutaneous  QHS  . levothyroxine  50 mcg Oral Q0600  . losartan  150 mg Oral Daily  . multivitamin with minerals  1 tablet Oral Daily  . nicotine  21 mg Transdermal Daily  . potassium chloride  40 mEq Oral Daily  . sodium chloride flush  3 mL Intravenous Q12H  . sodium chloride flush  3 mL Intravenous Q12H  . spironolactone  25 mg Oral Daily  . thiamine  100 mg Oral Daily  . torsemide  40 mg Oral BID  . traZODone  100 mg Oral QHS    Infusions: . sodium chloride    . sodium chloride    . sodium chloride      PRN Medications: sodium chloride, sodium chloride, sodium chloride, acetaminophen, albuterol, ipratropium-albuterol, ondansetron (ZOFRAN) IV, sodium chloride flush, sodium chloride flush, traMADol    Patient Profile   [name] is a 58 y.o. female with history of PAD, carotid stenosis s/p left CEA, relatively mild CAD, chronic systolic CHF (EF  71-21%), paroxysmal atrial fibrillation, PAD s/p atherectomy of right SFA 04/2017, hx of junctional bradycardia, OSA no CPAP, cirrhosis, COPD, and prior substance abuse.   Admitted from [facility] office with 30 lb weight gain in setting of diet and medication noncompliance.   Assessment/Plan   1. Acute on chronic systolic CHF: Echo 97/5883 with EF 45-50% (improved from 40-45% 11/2016). Has been presumed nonischemic given LHC in 12/15 showing only 80% stenosis in small OM1.Admitted with volume markedly overloaded in setting of noncompliance  Still with volume overload   Pt 12.5 L negative     Hold torsemide later today   Check labs in am     2. Atrial fibrillation: Paroxysmal.  Remains in SR   Continue amiodarone  - Continue eliquis 5 mg twice a day. Hgb low but no overt bleeding.   3. GPQ:DIYMEBRAXENM bilaterally. She has foot pain almost constantly. Not candidate for cilostazol with CHF. 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  She has lower extremity wounds that are wrapped.  - Wound care following.   4. COPD: Continues to smoke 1 ppd. Talked about stppping    6. OSA: Unable to tolerate CPAP. no change.  7. CKD stage III:  Cr has bumped a bit since admit  1.85  Will holod asix today    8. HTN: SBP 140s, have increased meds this admission.   9. Anemia: Hgb low but stable.  Denies overt bleeding.   Fe analysis consistent with Fe deficient anemia    Givne Feraheme   WIll need t obo be followed   - Check hemoccult.  10. RLE wounds: WOC RN consulted. Right leg has two full thickness stasis wounds on calf. Foam dressing applied with ACE wrap.  - On doxycycline to cover for cellulitis   5 days    Length of Stay: 5  Dorris Carnes, MD  12/01/2018, 10:15 AM

## 2018-12-01 NOTE — Progress Notes (Signed)
Patient says the she walked to the doorway with occupational therapy and does not want to walk now. Will follow up with the patient on Monday.Barnet Pall, RN,BSN 12/01/2018 12:54 PM

## 2018-12-02 LAB — CBC
HCT: 31 % — ABNORMAL LOW (ref 36.0–46.0)
Hemoglobin: 9.5 g/dL — ABNORMAL LOW (ref 12.0–15.0)
MCH: 29.4 pg (ref 26.0–34.0)
MCHC: 30.6 g/dL (ref 30.0–36.0)
MCV: 96 fL (ref 80.0–100.0)
Platelets: 351 K/uL (ref 150–400)
RBC: 3.23 MIL/uL — ABNORMAL LOW (ref 3.87–5.11)
RDW: 15.6 % — ABNORMAL HIGH (ref 11.5–15.5)
WBC: 11.7 K/uL — ABNORMAL HIGH (ref 4.0–10.5)
nRBC: 0 % (ref 0.0–0.2)

## 2018-12-02 LAB — BASIC METABOLIC PANEL WITH GFR
Anion gap: 12 (ref 5–15)
BUN: 44 mg/dL — ABNORMAL HIGH (ref 6–20)
CO2: 31 mmol/L (ref 22–32)
Calcium: 9.1 mg/dL (ref 8.9–10.3)
Chloride: 96 mmol/L — ABNORMAL LOW (ref 98–111)
Creatinine, Ser: 1.87 mg/dL — ABNORMAL HIGH (ref 0.44–1.00)
GFR calc Af Amer: 34 mL/min — ABNORMAL LOW
GFR calc non Af Amer: 30 mL/min — ABNORMAL LOW
Glucose, Bld: 132 mg/dL — ABNORMAL HIGH (ref 70–99)
Potassium: 4.1 mmol/L (ref 3.5–5.1)
Sodium: 139 mmol/L (ref 135–145)

## 2018-12-02 LAB — GLUCOSE, CAPILLARY
Glucose-Capillary: 88 mg/dL (ref 70–99)
Glucose-Capillary: 325 mg/dL — ABNORMAL HIGH (ref 70–99)
Glucose-Capillary: 139 mg/dL — ABNORMAL HIGH (ref 70–99)
Glucose-Capillary: 205 mg/dL — ABNORMAL HIGH (ref 70–99)

## 2018-12-02 MED ORDER — AMLODIPINE BESYLATE 2.5 MG PO TABS
5.0000 mg | ORAL_TABLET | Freq: Every day | ORAL | Status: DC
  Filled 2018-12-02: qty 2

## 2018-12-02 MED ORDER — FUROSEMIDE 10 MG/ML IJ SOLN
80.0000 mg | Freq: Two times a day (BID) | INTRAMUSCULAR | Status: DC
  Administered 2018-12-02 (×2): 80 mg via INTRAVENOUS
  Filled 2018-12-02 (×3): qty 8

## 2018-12-02 NOTE — Progress Notes (Signed)
Patient ID: [name], female   DOB: [birth year], 58 y.o.   MRN: [id number]     Advanced Heart Failure Rounding Note  PCP-Cardiologist: Quay Burow, MD   Subjective:    Breathing is still short   No CP    R leg hurts     Objective:   Weight Range: 109.7 kg Body mass index is 44.23 kg/m.   Vital Signs:   Temp:  [98.1 F (36.7 C)-98.4 F (36.9 C)] 98.2 F (36.8 C) (01/19 0631) Pulse Rate:  [75-83] 78 (01/19 0631) Resp:  [18] 18 (01/19 0631) BP: (84-126)/(61-73) 116/61 (01/19 0631) SpO2:  [94 %-96 %] 94 % (01/19 0631) Weight:  [109.7 kg] 109.7 kg (01/19 0500) Last BM Date: 11/28/18  Weight change: Filed Weights   11/29/18 2011 12/01/18 0448 12/02/18 0500  Weight: 114.5 kg 112.4 kg 109.7 kg    Intake/Output:   Intake/Output Summary (Last 24 hours) at 12/02/2018 1008 Last data filed at 12/02/2018 0631 Gross per 24 hour  Intake 960 ml  Output 400 ml  Net 560 ml    NET NEG 11.7 L    Physical Exam    General: Morbidly obese 58 yo in NAD Neck: Neck full  Lungs: MIld rales at bases   CV:  RRR   No S3   Abdomen: Soft, nontender, no hepatosplenomegaly, no distention.  Skin   Legs erythmatous   R leg with dressing  Neurologic: Alert and oriented x 3.  Psych: Normal affect. Extremities: 1+ edema with erythema     HEENT: Normal.   Telemetry   Probable SR/ST   Motion  Personally reviewed.   EKG    No new tracings.   Labs    CBC Recent Labs    12/01/18 0542 12/02/18 0358  WBC 9.6 11.7*  HGB 8.9* 9.5*  HCT 29.2* 31.0*  MCV 94.8 96.0  PLT 326 542   Basic Metabolic Panel Recent Labs    12/01/18 0542 12/02/18 0358  NA 136 139  K 4.7 4.1  CL 93* 96*  CO2 29 31  GLUCOSE 202* 132*  BUN 48* 44*  CREATININE 1.85* 1.87*  CALCIUM 9.0 9.1   Liver Function Tests No results for input(s): AST, ALT, ALKPHOS, BILITOT, PROT, ALBUMIN in the last 72 hours. No results for input(s): LIPASE, AMYLASE in the last 72 hours. Cardiac Enzymes No results for  input(s): CKTOTAL, CKMB, CKMBINDEX, TROPONINI in the last 72 hours.  BNP: BNP (last 3 results) Recent Labs    10/18/18 1109 10/21/18 0125 11/27/18 0519  BNP 410.0* 548.1* 511.3*    ProBNP (last 3 results) No results for input(s): PROBNP in the last 8760 hours.   D-Dimer No results for input(s): DDIMER in the last 72 hours. Hemoglobin A1C No results for input(s): HGBA1C in the last 72 hours. Fasting Lipid Panel No results for input(s): CHOL, HDL, LDLCALC, TRIG, CHOLHDL, LDLDIRECT in the last 72 hours. Thyroid Function Tests No results for input(s): TSH, T4TOTAL, T3FREE, THYROIDAB in the last 72 hours.  Invalid input(s): FREET3  Other results:   Imaging    No results found.   Medications:     Scheduled Medications: . amiodarone  100 mg Oral Daily  . amLODipine  10 mg Oral Daily  . apixaban  5 mg Oral BID  . atorvastatin  40 mg Oral q1800  . bisoprolol  5 mg Oral Daily  . doxycycline  100 mg Oral Q12H  . folic acid  1 mg Oral Daily  . insulin  aspart  0-20 Units Subcutaneous TID WC  . insulin glargine  72 Units Subcutaneous QHS  . levothyroxine  50 mcg Oral Q0600  . losartan  150 mg Oral Daily  . multivitamin with minerals  1 tablet Oral Daily  . nicotine  21 mg Transdermal Daily  . potassium chloride  40 mEq Oral Daily  . sodium chloride flush  3 mL Intravenous Q12H  . sodium chloride flush  3 mL Intravenous Q12H  . spironolactone  25 mg Oral Daily  . thiamine  100 mg Oral Daily  . traZODone  100 mg Oral QHS    Infusions: . sodium chloride    . sodium chloride    . sodium chloride      PRN Medications: sodium chloride, sodium chloride, sodium chloride, acetaminophen, albuterol, alum & mag hydroxide-simeth, ipratropium-albuterol, ondansetron (ZOFRAN) IV, sodium chloride flush, sodium chloride flush, traMADol    Patient Profile   [name] is a 58 y.o. female with history of PAD, carotid stenosis s/p left CEA, relatively mild CAD, chronic  systolic CHF (EF 97-67%), paroxysmal atrial fibrillation, PAD s/p atherectomy of right SFA 04/2017, hx of junctional bradycardia, OSA no CPAP, cirrhosis, COPD, and prior substance abuse.   Admitted from [facility] office with 30 lb weight gain in setting of diet and medication noncompliance.   Assessment/Plan   1. Acute on chronic systolic CHF: Echo 34/1937 with EF 45-50% (improved from 40-45% 11/2016). Has been presumed nonischemic given LHC in 12/15 showing only 80% stenosis in small OM1.Admitted with volume markedly overloaded in setting of noncompliance  Still with volume overload   Pt 11.6 so far   Hold torsemide later today   Check labs in am     2. Atrial fibrillation: Paroxysmal.   Get EKG  Continue amiodarone  - Continue eliquis 5 mg twice a day. Hgb low but no overt bleeding.   3. TKW:IOXBDZHGDJME bilaterally. She has foot pain almost constantly. Not candidate for cilostazol with CHF. 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  She has lower extremity wounds that are wrapped.  - Wound care following.   On ABX    4. COPD: Continues to smoke 1 ppd Needs to stop    6. OSA: Unable to tolerate CPAP. no change.  7. CKD stage III: Will give lasix 80 once today    Continue aldactone    Check BMET in AM   8. HTN: SBP 140s, have increased meds this admission.  WIll pull back on cozaar to 150 and amlodipinne to 5 mg    Follow   9. Anemia: Hgb low but stable.  Denies overt bleeding.   Fe analysis consistent with Fe deficient anemia    Givne Feraheme   WIll need t obo be followed   - Check hemoccult.  10. RLE wounds: WOC RN consulted. Right leg has two full thickness stasis wounds on calf. Foam dressing applied with ACE wrap.  - On doxycycline to cover for cellulitis   5 days     Stressed importance of diet and salt / fluid intake    Length of Stay: 6  Dorris Carnes, MD  12/02/2018, 10:08 AM

## 2018-12-02 NOTE — Plan of Care (Signed)
  Problem: Activity: Goal: Capacity to carry out activities will improve Outcome: Progressing   Problem: Cardiac: Goal: Ability to achieve and maintain adequate cardiopulmonary perfusion will improve Outcome: Progressing   Problem: Spiritual Needs Goal: Ability to function at adequate level Outcome: Progressing

## 2018-12-03 LAB — CBC
HEMATOCRIT: 31.3 % — AB (ref 36.0–46.0)
Hemoglobin: 9.3 g/dL — ABNORMAL LOW (ref 12.0–15.0)
MCH: 28.7 pg (ref 26.0–34.0)
MCHC: 29.7 g/dL — ABNORMAL LOW (ref 30.0–36.0)
MCV: 96.6 fL (ref 80.0–100.0)
Platelets: 377 10*3/uL (ref 150–400)
RBC: 3.24 MIL/uL — ABNORMAL LOW (ref 3.87–5.11)
RDW: 15.8 % — ABNORMAL HIGH (ref 11.5–15.5)
WBC: 14.1 10*3/uL — ABNORMAL HIGH (ref 4.0–10.5)
nRBC: 0 % (ref 0.0–0.2)

## 2018-12-03 LAB — BASIC METABOLIC PANEL
Anion gap: 14 (ref 5–15)
BUN: 40 mg/dL — ABNORMAL HIGH (ref 6–20)
CO2: 28 mmol/L (ref 22–32)
Calcium: 9.3 mg/dL (ref 8.9–10.3)
Chloride: 97 mmol/L — ABNORMAL LOW (ref 98–111)
Creatinine, Ser: 1.84 mg/dL — ABNORMAL HIGH (ref 0.44–1.00)
GFR calc Af Amer: 35 mL/min — ABNORMAL LOW (ref >60)
GFR calc non Af Amer: 30 mL/min — ABNORMAL LOW (ref >60)
Glucose, Bld: 164 mg/dL — ABNORMAL HIGH (ref 70–99)
Potassium: 4.4 mmol/L (ref 3.5–5.1)
Sodium: 139 mmol/L (ref 135–145)

## 2018-12-03 LAB — GLUCOSE, CAPILLARY: Glucose-Capillary: 153 mg/dL — ABNORMAL HIGH (ref 70–99)

## 2018-12-03 MED ORDER — LOSARTAN POTASSIUM 50 MG PO TABS
100.0000 mg | ORAL_TABLET | Freq: Every day | ORAL | Status: DC
  Administered 2018-12-03: 100 mg via ORAL

## 2018-12-03 MED ORDER — METOLAZONE 2.5 MG PO TABS: 2.5000 mg | ORAL_TABLET | ORAL | 5 refills | Status: DC

## 2018-12-03 MED ORDER — SPIRONOLACTONE 25 MG PO TABS: 25.0000 mg | ORAL_TABLET | Freq: Every day | ORAL | 5 refills | Status: DC

## 2018-12-03 MED ORDER — POTASSIUM CHLORIDE CRYS ER 20 MEQ PO TBCR: 40.0000 meq | EXTENDED_RELEASE_TABLET | Freq: Every day | ORAL | 5 refills | Status: DC

## 2018-12-03 MED ORDER — TORSEMIDE 20 MG PO TABS
40.0000 mg | ORAL_TABLET | Freq: Two times a day (BID) | ORAL | Status: DC
  Administered 2018-12-03: 40 mg via ORAL
  Filled 2018-12-03: qty 2

## 2018-12-03 MED ORDER — AMLODIPINE BESYLATE 10 MG PO TABS
10.0000 mg | ORAL_TABLET | Freq: Every day | ORAL | Status: DC
  Administered 2018-12-03: 10 mg via ORAL
  Filled 2018-12-03: qty 1

## 2018-12-03 MED ORDER — BISOPROLOL FUMARATE 5 MG PO TABS: 5.0000 mg | ORAL_TABLET | Freq: Every day | ORAL | 5 refills | Status: DC

## 2018-12-03 NOTE — Discharge Summary (Signed)
Advanced Heart Failure Discharge Note  Discharge Summary   Patient ID: [name] MRN: [id number], DOB/AGE: December 28, 1960 58 y.o. Admit date: 11/26/2018 D/C date:     12/03/2018   Primary Discharge Diagnoses:  1. Acute on Chronic Systolic HF 2. PAF 3. PAD 4. COPD 5. Hx of cirrhosis 6. OSA 7. CKD 3 8. HTN 9. Anemia 10. RLE wounds  Hospital Course:  [name] a 58 y.o.femalewith history of PAD, carotid stenosis s/p left CEA, relatively mild CAD, chronic systolic CHF (EF 76-22%), paroxysmal atrial fibrillation, PAD s/p atherectomy of right SFA 04/2017, hx of junctional bradycardia, OSA no CPAP, cirrhosis, COPD,and prior substance abuse.  Admitted from [facility] office with 30 lb weight gain in setting of diet and medication noncompliance. She diuresed 33 lbs with IV lasix, then resumed home torsemide 40 mg BID. Spiro added back. Losartan decreased with mild AKI. Amlodipine increased for BP control. Course complicated by mild AKI. She also was treated with 5 day course of doxy for RLE cellulitis. Dr Gwenlyn Found saw her and there are no options for below the knee revascularization. She also has chronic iron deficiency anemia and received a dose of feraheme on 11/30/18. See below for detailed problem-based review of admission.   1.Acute on chronic systolic QJF:HLKT 62/5638 with EF 45-50% (improved from40-45% 11/2016). Has been presumed nonischemic given LHC in 12/15 showing only 80% stenosis in small OM1.Admitted with volumemarkedlyoverloaded(30 lbs up) in setting of medication and dietary noncompliance. She is down 33 lbs.  - Diuresed with IV lasix, then transitioned back to torsemide 40 mg po bid -Continue bisoprolol 5 mg daily.  - Decreased losartan to 100 mg daily with mild AKI. -Continue spironolactone 25 mg daily.  2. Atrial fibrillation: Paroxysmal. Maintained NSR. - Continue amiodarone 100 mg daily.LFTs stable this admission. TSH stable last month.PCP following  hypothyroidism (on Levoxyl). Needs regular eye exams. - Continue eliquis 5 mg twice a day.  3. LHT:DSKAJGOTLXBW bilaterally. She has right foot pain almost constantly. Not candidate for cilostazol with CHF. 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  She has lower extremity wounds that are wrapped.  Seen by Dr. Gwenlyn Found, no options for right leg below the knee revascularization.  Suspect some of the pain is neuropathic, but she has not been able to tolerate Lyrica or gabapentin due to tremors.   - Wound care following.  - She will follow up with Dr. Gwenlyn Found as outpatient.  - May need pain clinic referral as outpatient.   4. COPD: Continues to smoke1 ppd. Talked about cessation. 5. H/o cirrhosis:  Likely from ETOH. LFTs stable.  - Urged her to stop drinking.  6. OSA: Unable to tolerate CPAP. 7. CKD stage III: Creatinine baseline around 1.3-1.5. Creatinine is 1.85 on day of DC. Check BMET in 1 week through [facility].   8. HTN: Medications adjusted this admission - Increased amlodipine to 10 mg daily. Losartan decreased to 100 mg daily with mild AKI. 9. Anemia: Hgb low but stable. No overt bleeding. Fe deficiency anemia.  - Received feraheme 11/30/18.   10.RLE wounds: WOC RN consulted. Right leg has two full thickness stasis wounds on calf. Foam dressing applied with ACE wrap.  - Finished doxycycline for cellulitis. As above, has follow up with Dr Gwenlyn Found.   She will be followed by [facility] RN and PT. Check BMET in 1 week. She will follow closely in HF clinic, with appointment as below. Follow up appointment made  with Dr Gwenlyn Found. She has been referred to HF paramedicine.   Discharge Weight Range: 236.6 lbs.  Discharge Vitals: Blood pressure (!) 126/56, pulse 68, temperature 98.1 F (36.7 C), temperature source Oral, resp. rate 18, height 5\' 2"  (1.575 m), weight 107.3 kg, last menstrual period 11/27/2012, SpO2 97 %.  Labs: Lab Results   Component Value Date   WBC 14.1 (H) 12/03/2018   HGB 9.3 (L) 12/03/2018   HCT 31.3 (L) 12/03/2018   MCV 96.6 12/03/2018   PLT 377 12/03/2018    Recent Labs  Lab 11/27/18 0519  12/03/18 0457  NA 142   < > 139  K 3.5   < > 4.4  CL 108   < > 97*  CO2 25   < > 28  BUN 29*   < > 40*  CREATININE 1.33*   < > 1.84*  CALCIUM 8.2*   < > 9.3  ALT 45*  --   --   AST 27  --   --   GLUCOSE 96   < > 164*   < > = values in this interval not displayed.   Lab Results  Component Value Date   CHOL 146 03/27/2018   HDL 39 (L) 03/27/2018   LDLCALC 63 03/27/2018   TRIG 222 (H) 03/27/2018   BNP (last 3 results) Recent Labs    10/18/18 1109 10/21/18 0125 11/27/18 0519  BNP 410.0* 548.1* 511.3*    ProBNP (last 3 results) No results for input(s): PROBNP in the last 8760 hours.   Diagnostic Studies/Procedures   No results found.  Discharge Medications   Allergies as of 12/03/2018      Reactions   Gabapentin Nausea And Vomiting, Other (See Comments)   POSSIBLE SHAKING? TAKING MED CURRENTLY   Lyrica [pregabalin] Other (See Comments)   Shaking Pt still taking lyrica--"probably will stop taking"       Medication List    TAKE these medications   albuterol 108 (90 Base) MCG/ACT inhaler Commonly known as:  PROVENTIL HFA;VENTOLIN HFA Inhale 2 puffs into the lungs every 6 (six) hours as needed for wheezing or shortness of breath.   amiodarone 100 MG tablet Commonly known as:  PACERONE TAKE 1 TABLET (100 MG TOTAL) BY MOUTH DAILY What changed:  See the new instructions.   amLODipine 10 MG tablet Commonly known as:  NORVASC Take 1 tablet (10 mg total) by mouth daily.   apixaban 5 MG Tabs tablet Commonly known as:  ELIQUIS Take 1 tablet (5 mg total) by mouth 2 (two) times daily.   atorvastatin 40 MG tablet Commonly known as:  LIPITOR TAKE 1 TABLET BY MOUTH ONCE EVERY EVENING What changed:    how much to take  how to take this  when to take this   bisoprolol 5 MG  tablet Commonly known as:  ZEBETA Take 1 tablet (5 mg total) by mouth daily. Start taking on:  December 04, 2018 What changed:    medication strength  how much to take   cholecalciferol 1000 units tablet Commonly known as:  VITAMIN D Take 1,000 Units by mouth daily.   folic acid 1 MG tablet Commonly known as:  FOLVITE Take 1 tablet (1 mg total) by mouth daily.   insulin aspart 100 UNIT/ML injection Commonly known as:  NOVOLOG Inject 30 Units into the skin 3 (three) times daily before meals.   LANTUS SOLOSTAR 100 UNIT/ML Solostar Pen Generic drug:  Insulin Glargine Inject 72 Units into the skin at bedtime.  levothyroxine 50 MCG tablet Commonly known as:  SYNTHROID, LEVOTHROID Take 1 tablet (50 mcg total) by mouth daily.   losartan 100 MG tablet Commonly known as:  COZAAR Take 1 tablet (100 mg total) by mouth daily.   metolazone 2.5 MG tablet Commonly known as:  ZAROXOLYN Take 1 tablet (2.5 mg total) by mouth once a week. On Thursdays What changed:  additional instructions   multivitamin with minerals Tabs tablet Take 1 tablet by mouth daily.   nicotine 21 mg/24hr patch Commonly known as:  NICODERM CQ Place 1 patch (21 mg total) onto the skin daily.   potassium chloride SA 20 MEQ tablet Commonly known as:  K-DUR,KLOR-CON Take 2 tablets (40 mEq total) by mouth daily. Start taking on:  December 04, 2018   spironolactone 25 MG tablet Commonly known as:  ALDACTONE Take 1 tablet (25 mg total) by mouth daily. Start taking on:  December 04, 2018   thiamine 100 MG tablet Take 1 tablet (100 mg total) by mouth daily.   torsemide 20 MG tablet Commonly known as:  DEMADEX Take 40 mg by mouth 2 (two) times daily.   traZODone 100 MG tablet Commonly known as:  DESYREL Take 100 mg by mouth at bedtime as needed for sleep.            Durable Medical Equipment  (From admission, onward)         Start     Ordered   12/03/18 0934  Heart failure home health orders   (Heart failure home health orders / Face to face)  Once    Comments:  Heart Failure Follow-up Care:  Verify follow-up appointments per Patient Discharge Instructions. Confirm transportation arranged. Reconcile home medications with discharge medication list. Remove discontinued medications from use. Assist patient/caregiver to manage medications using pill box. Reinforce low sodium food selection Assessments: Vital signs and oxygen saturation at each visit. Assess home environment for safety concerns, caregiver support and availability of low-sodium foods. Consult Education officer, museum, PT/OT, Dietitian, and CNA based on assessments. Perform comprehensive cardiopulmonary assessment. Notify MD for any change in condition or weight gain of 3 pounds in one day or 5 pounds in one week with symptoms. Daily Weights and Symptom Monitoring: Ensure patient has access to scales. Teach patient/caregiver to weigh daily before breakfast and after voiding using same scale and record.    Teach patient/caregiver to track weight and symptoms and when to notify Provider. Activity: Develop individualized activity plan with patient/caregiver.  HHRN 2 wk 2 for CP assessment  PT 2 wk 2 for CP Rehab.   Please draw a BMET in 1 week and fax to heart failure clinic.  Question Answer Comment  Heart Failure Follow-up Care Advanced Heart Failure (AHF) Clinic at [phone]   Obtain the following labs Other see comments   Lab frequency Other see comments   Fax lab results to AHF Clinic at [phone]   Diet Low Sodium Heart Healthy   Fluid restrictions: 2000 mL Fluid   Initiate Heart Failure Clinic Diuretic Protocol to be used by [facility] only ( to be ordered by Heart Failure Team Providers Only) Yes      12/03/18 0937          Disposition   The patient will be discharged in stable condition to home. Discharge Instructions    (HEART FAILURE PATIENTS) Call MD:  Anytime you have any of the  following symptoms: 1) 3 pound weight gain in 24 hours or 5 pounds in  1 week 2) shortness of breath, with or without a dry hacking cough 3) swelling in the hands, feet or stomach 4) if you have to sleep on extra pillows at night in order to breathe.   Complete by:  As directed    Call MD for:  difficulty breathing, headache or visual disturbances   Complete by:  As directed    Call MD for:  persistant dizziness or light-headedness   Complete by:  As directed    Diet - low sodium heart healthy   Complete by:  As directed    Heart Failure patients record your daily weight using the same scale at the same time of day   Complete by:  As directed    Increase activity slowly   Complete by:  As directed    STOP any activity that causes chest pain, shortness of breath, dizziness, sweating, or exessive weakness   Complete by:  As directed      [facility] Follow up.   Why:  They will do your home health care at your home Contact information: [address] [place] [address] [phone]        [name], MD Follow up on 12/25/2018.   Specialties:  Cardiology, Radiology Why:  9 am. Follow up for PAD and lower extremity wounds.  Contact information: [address] [place] [address] [phone]        Darrick Grinder D, NP Follow up on 12/18/2018.   Specialty:  Cardiology Why:  11 am. Hospital follow up in heart failure clinic. Garage code for February 0227.  Contact information: [address] [place] [address] [phone]             Duration of Discharge Encounter: Greater than 35 minutes   Signed, Georgiana Shore, NP 12/03/2018, 9:58 AM

## 2018-12-03 NOTE — Progress Notes (Signed)
Patient ID: [name], female   DOB: 19-Sep-1961, 58 y.o.   MRN: [id number]     Advanced Heart Failure Rounding Note  PCP-Cardiologist: Quay Burow, MD   Subjective:    Admitted with volume overload/dyspnea.   She was switched to torsemide 40 mg BID on Friday, but 80 mg IV lasix BID restarted yesterday. Creatinine up to 1.63 -> 1.85 -> 1.87 -> 1.84. Weight is down 11 more lbs over the weekend, 33 lbs total.   Hemoglobin stable 9.3. Iron stores low.    Objective:   Weight Range: 107.3 kg Body mass index is 43.27 kg/m.   Vital Signs:   Temp:  [98.1 F (36.7 C)-98.2 F (36.8 C)] 98.1 F (36.7 C) (01/20 0615) Pulse Rate:  [68-85] 68 (01/20 0615) Resp:  [18] 18 (01/20 0615) BP: (131-143)/(65-86) 143/65 (01/20 0615) SpO2:  [95 %-97 %] 97 % (01/20 0615) Weight:  [107.3 kg] 107.3 kg (01/20 0616) Last BM Date: 12/01/18  Weight change: Filed Weights   12/01/18 0448 12/02/18 0500 12/03/18 0616  Weight: 112.4 kg 109.7 kg 107.3 kg    Intake/Output:   Intake/Output Summary (Last 24 hours) at 12/03/2018 0820 Last data filed at 12/03/2018 0616 Gross per 24 hour  Intake 1200 ml  Output 1700 ml  Net -500 ml      Physical Exam    General: No resp difficulty. HEENT: Normal Neck: Supple. JVP 5-6. Carotids 2+ bilat; no bruits. No thyromegaly or nodule noted. Cor: PMI nondisplaced. RRR, No M/G/R noted Lungs: CTAB, normal effort. Abdomen: Soft, non-tender, non-distended, no HSM. No bruits or masses. +BS  Extremities: No cyanosis, clubbing, or rash. R and LLE no edema.  Neuro: Alert & orientedx3, cranial nerves grossly intact. moves all 4 extremities w/o difficulty. Affect pleasant  Telemetry   NSR 60-70s. Personally reviewed.   EKG    No new tracings.   Labs    CBC Recent Labs    12/02/18 0358 12/03/18 0457  WBC 11.7* 14.1*  HGB 9.5* 9.3*  HCT 31.0* 31.3*  MCV 96.0 96.6  PLT 351 762   Basic Metabolic Panel Recent Labs    12/02/18 0358 12/03/18 0457    NA 139 139  K 4.1 4.4  CL 96* 97*  CO2 31 28  GLUCOSE 132* 164*  BUN 44* 40*  CREATININE 1.87* 1.84*  CALCIUM 9.1 9.3   Liver Function Tests No results for input(s): AST, ALT, ALKPHOS, BILITOT, PROT, ALBUMIN in the last 72 hours. No results for input(s): LIPASE, AMYLASE in the last 72 hours. Cardiac Enzymes No results for input(s): CKTOTAL, CKMB, CKMBINDEX, TROPONINI in the last 72 hours.  BNP: BNP (last 3 results) Recent Labs    10/18/18 1109 10/21/18 0125 11/27/18 0519  BNP 410.0* 548.1* 511.3*    ProBNP (last 3 results) No results for input(s): PROBNP in the last 8760 hours.   D-Dimer No results for input(s): DDIMER in the last 72 hours. Hemoglobin A1C No results for input(s): HGBA1C in the last 72 hours. Fasting Lipid Panel No results for input(s): CHOL, HDL, LDLCALC, TRIG, CHOLHDL, LDLDIRECT in the last 72 hours. Thyroid Function Tests No results for input(s): TSH, T4TOTAL, T3FREE, THYROIDAB in the last 72 hours.  Invalid input(s): FREET3  Other results:   Imaging    No results found.   Medications:     Scheduled Medications: . amiodarone  100 mg Oral Daily  . amLODipine  5 mg Oral Daily  . apixaban  5 mg Oral BID  . atorvastatin  40 mg Oral q1800  . bisoprolol  5 mg Oral Daily  . folic acid  1 mg Oral Daily  . furosemide  80 mg Intravenous Q12H  . insulin aspart  0-20 Units Subcutaneous TID WC  . insulin glargine  72 Units Subcutaneous QHS  . levothyroxine  50 mcg Oral Q0600  . losartan  150 mg Oral Daily  . multivitamin with minerals  1 tablet Oral Daily  . nicotine  21 mg Transdermal Daily  . potassium chloride  40 mEq Oral Daily  . sodium chloride flush  3 mL Intravenous Q12H  . sodium chloride flush  3 mL Intravenous Q12H  . spironolactone  25 mg Oral Daily  . thiamine  100 mg Oral Daily  . traZODone  100 mg Oral QHS    Infusions: . sodium chloride    . sodium chloride    . sodium chloride      PRN Medications: sodium  chloride, sodium chloride, sodium chloride, acetaminophen, albuterol, alum & mag hydroxide-simeth, ipratropium-albuterol, ondansetron (ZOFRAN) IV, sodium chloride flush, sodium chloride flush, traMADol    Patient Profile   [name] is a 58 y.o. female with history of PAD, carotid stenosis s/p left CEA, relatively mild CAD, chronic systolic CHF (EF 76-19%), paroxysmal atrial fibrillation, PAD s/p atherectomy of right SFA 04/2017, hx of junctional bradycardia, OSA no CPAP, cirrhosis, COPD, and prior substance abuse.   Admitted from [facility] office with 30 lb weight gain in setting of diet and medication noncompliance.   Assessment/Plan   1. Acute on chronic systolic CHF: Echo 50/9326 with EF 45-50% (improved from 40-45% 11/2016). Has been presumed nonischemic given LHC in 12/15 showing only 80% stenosis in small OM1.Admitted with volume markedly overloaded (30 lbs up) in setting of medication and dietary noncompliance. She is down 33 lbs. Received IV lasix yesterday. Creatinine stable at 1.84.  - Stop IV lasix. Resume torsemide 40 mg po bid - Continue bisoprolol 5 mg daily.   - Decrease losartan to 100 mg daily with mild AKI. - Continue spironolactone 25 mg daily.  2. Atrial fibrillation: Paroxysmal. NSR today.  - Continue amiodarone 100 mg daily. LFTs stable this admission. TSH stable last month. PCP following hypothyroidism (on Levoxyl). Needs regular eye exams. - Continue eliquis 5 mg twice a day. Hgb stable 9.3. Denies bleeding.  3. ZTI:[id number] bilaterally. She has right foot pain almost constantly. Not candidate for cilostazol with CHF. 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  She has lower extremity wounds that are wrapped.  Seen by Dr. Gwenlyn Found, no options for right leg below the knee revascularization.  Suspect some of the pain is neuropathic, but she has not been able to tolerate Lyrica or gabapentin due to  tremors.  - Wound care following.  - Follow up with Dr. Gwenlyn Found as outpatient. No change.  - Will likely need pain clinic referral as outpatient.   4. COPD: Continues to smoke 1 ppd. Talked about cessation. No change.  5. H/o cirrhosis:  Likely from ETOH. LFTs stable.  - Urged her to stop drinking. No change.  6. OSA: Unable to tolerate CPAP. No change.  7. CKD stage III: Creatinine baseline around 1.3-1.5. Creatinine is 1.85 today but stable. See above.  8. HTN: SBP 110-140s, have increased meds this admission.   - Increase amlodipine to 10 mg daily with decrease in losartan.  9. Anemia: Hgb low but stable.  Denies overt bleeding. Fe deficiency anemia.  -  Check hemoccult.  - Received feraheme 11/30/18.   10. RLE wounds: WOC RN consulted. Right leg has two full thickness stasis wounds on calf. Foam dressing applied with ACE wrap.  - Finished doxycycline to cover for cellulitis   She has been referred to HF paramedicine. I have requested HF follow up. Will arrange for DC today. I will make her follow up with Dr Gwenlyn Found as well.   Length of Stay: [place], NP  12/03/2018, 8:20 AM  Advanced Heart Failure Team Pager [phone] (M-F; 7a - 4p)  Please contact [facility] for night-coverage after hours (4p -7a ) and weekends on [email or web address]  Patient seen with NP, agree with the above note.    She diuresed well again yesterday, weight down again.  Creatinine stable at 1.8.  On exam, she does not look volume overloaded.    I will transition her back to po diuretics, torsemide 40 mg bid.  With some elevation in creatinine and high BP, will decrease losartan and increase amlodipine as above.   I think that she can go home today. She will need paramedicine and close followup in CHF clinic. Will need home PT.    Loralie Champagne 12/03/2018 8:34 AM

## 2018-12-03 NOTE — Care Management Note (Signed)
Case Management Note Original Note Created Sherrilyn Rist [phone] 11/28/2018, 11:09 AM   Patient Details  Name: [name] MRN: [id number] Date of Birth: 12-Aug-1961  Action/Plan: CM talked to patient at the bedside for [facility] choices, she requested [facility]; Dan with [facility] called for arrangements. CM will continue to follow for progression of care.  Expected Discharge Date:  12/03/18 possibly 10/03/2019              Expected Discharge Plan:  [place]  Discharge planning Services  CM Consult  Choice offered to:  Patient  HH Arranged:  RN, Disease Management, PT, Nurse's [place]  Status of Service:  Completed, signed off   Additional Comments:   12/03/18- Marvetta Gibbons RN, CM- pt for transition home today- orders have been placed for HHRN/PT and HF disease management- notified Dan with [facility] who is following for [facility] needs.     Dahlia Client, Romeo Rabon, RN,BSN  Cross Coverage for 3E on 12/03/18 12/03/2018, 10:55 AM

## 2018-12-03 NOTE — Progress Notes (Signed)
Physical Therapy Treatment Patient Details Name: [name] MRN: [id number] DOB: 1961-02-02 Today's Date: 12/03/2018    History of Present Illness Pt is a 58 y.o. female admitted 11/26/18 with acute on chronic CHF. Patient also with R LE cellulitis. PMH includes DM, R transmetatarsal amputation, HTN, PAD, HLD, Afib on Eliquis, CHF, COPD, CKD, peripheral neuropathy, left carotid endarterectomy.   PT Comments    Pt slowly progressing with mobility. Remains limited by c/o chronic bilateral foot and lower leg pain. Pt preparing to discharge home today. Discussed strategies for fall risk reduction and safe mobility, particularly with the two flights of steps she has into apartment; also discussed phantom limb pain and relief strategies. If to remain admitted, will follow acutely.    Follow Up Recommendations  Home health PT     Equipment Recommendations  None recommended by PT    Recommendations for Other Services       Precautions / Restrictions Precautions Precautions: Fall Restrictions Weight Bearing Restrictions: No    Mobility  Bed Mobility               General bed mobility comments: pt up in recliner upon arrival  Transfers Overall transfer level: Modified independent Equipment used: Rolling walker (2 wheeled)                Ambulation/Gait             General Gait Details: Deferred secondary to pain. Has been ambulatory in room mod indep with RW   Stairs             Wheelchair Mobility    Modified Rankin (Stroke Patients Only)       Balance Overall balance assessment: Modified Independent                                          Cognition Arousal/Alertness: Awake/alert Behavior During Therapy: WFL for tasks assessed/performed Overall Cognitive Status: Within Functional Limits for tasks assessed                                        Exercises General Exercises - Lower Extremity Long Arc  Quad: AROM;Both;Seated Hip Flexion/Marching: AROM;Both;Seated Other Exercises Other Exercises: Discussed supine SLR, ABD/ADD, heel slides    General Comments General comments (skin integrity, edema, etc.): Discussed neuropathic pain and phantom limb pain as pt c/o R toe pain although s/p transmet amputation >1 yr; discussed strategies for pain relief      Pertinent Vitals/Pain Pain Assessment: Faces Faces Pain Scale: Hurts even more Pain Location: Bilateral feet (R>L) Pain Descriptors / Indicators: Aching;Constant;Squeezing Pain Intervention(s): Monitored during session;Limited activity within patient's tolerance    Home Living                      Prior Function            PT Goals (current goals can now be found in the care plan section) Acute Rehab PT Goals Patient Stated Goal: Less foot pain PT Goal Formulation: With patient Time For Goal Achievement: 12/14/18 Potential to Achieve Goals: Good Progress towards PT goals: Progressing toward goals    Frequency    Min 3X/week      PT Plan Current plan remains appropriate    Co-evaluation  AM-PAC PT "6 Clicks" Mobility   Outcome Measure  Help needed turning from your back to your side while in a flat bed without using bedrails?: None Help needed moving from lying on your back to sitting on the side of a flat bed without using bedrails?: None Help needed moving to and from a bed to a chair (including a wheelchair)?: None Help needed standing up from a chair using your arms (e.g., wheelchair or bedside chair)?: A Little Help needed to walk in hospital room?: A Little Help needed climbing 3-5 steps with a railing? : A Little 6 Click Score: 21    End of Session   Activity Tolerance: Patient limited by pain Patient left: in chair Nurse Communication: Mobility status PT Visit Diagnosis: Muscle weakness (generalized) (M62.81);Difficulty in walking, not elsewhere classified (R26.2);Pain Pain  - Right/Left: Right Pain - part of body: Leg     Time: 6147-0929 PT Time Calculation (min) (ACUTE ONLY): 14 min  Charges:  $Self Care/Home Management: Cook, PT, DPT Acute Rehabilitation Services  Pager [phone] Office [place] 12/03/2018, 11:06 AM

## 2018-12-03 NOTE — Progress Notes (Signed)
CARDIAC REHAB PHASE I   Pt eating breakfast. Reviewed and reinforced HF ed. Pt verablizes understanding the importance of daily weights, watching salt, and activity. Encouraged smoking and alcohol cessation. Pt for poss d/c today.  [phone] Rufina Falco, RN BSN 12/03/2018 9:08 AM

## 2018-12-04 ENCOUNTER — Encounter (HOSPITAL_COMMUNITY): Payer: Medicare HMO | Admitting: Cardiology

## 2018-12-05 ENCOUNTER — Other Ambulatory Visit (HOSPITAL_COMMUNITY): Payer: Self-pay

## 2018-12-05 NOTE — Progress Notes (Signed)
Paramedicine Encounter    Patient ID: [name], female    DOB: December 01, 1960, 59 y.o.   MRN: [id number]   Patient Care Team: Tsosie Billing, MD as PCP - General (Internal Medicine) [name], MD as PCP - Cardiology (Cardiology) [name], MD as Consulting Physician (Cardiology) [name], MD as Consulting Physician (Pulmonary Disease) Jorge Ny, LCSW as Social Worker (Licensed Clinical Social Worker)  Patient Active Problem List   Diagnosis Date Noted  . Acute CHF (congestive heart failure) ([place]) 11/26/2018  . CHF (congestive heart failure) ([place]) 11/26/2018  . Acute respiratory failure ([place]) 10/21/2018  . AKI (acute kidney injury) ([place]) 08/18/2018  . Coagulation disorder ([place]) 08/09/2017  . Depression 07/21/2017  . At risk for adverse drug reaction 06/20/2017  . Peripheral neuropathy 06/20/2017  . Acute osteomyelitis of right foot ([place]) 06/13/2017  . S/P transmetatarsal amputation of foot, right ([place]) 06/05/2017  . 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 PAD (peripheral artery disease) ([place])   . SIRS (systemic inflammatory response syndrome) ([place]) 04/06/2017  . CKD (chronic kidney disease), stage III ([place]) 11/24/2016  . Suspected sleep apnea 11/24/2016  . Severe obesity (BMI >= 40) ([place]) 02/24/2016  . COPD GOLD 0  02/24/2016  . Chronic systolic CHF (congestive heart failure) ([place]) 02/12/2016  . Encounter for therapeutic drug monitoring 02/10/2016  . Symptomatic bradycardia 01/12/2016  . Essential hypertension 12/22/2015  . Acute on chronic combined systolic and diastolic CHF (congestive heart failure) ([place])   . Wheeze   . 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 Tobacco abuse 10/23/2015  . Coronary artery disease   . DOE (dyspnea on exertion) 04/29/2015  . PAF (paroxysmal atrial fibrillation) ([place]) 01/16/2015  . Carotid arterial disease ([place]) 01/16/2015  .  Claudication ([place]) 01/15/2015  . Demand ischemia ([place]) 10/29/2014  . Insomnia 02/03/2014  . S/P peripheral artery angioplasty - [place] atherectomy; R SFA 09/11/2013    Class: Acute  . Leg pain, bilateral 08/19/2013  . Hypothyroidism 07/31/2013  . Cellulitis 06/13/2013  . History of cocaine abuse ([place]) 06/13/2013  . Long term current use of anticoagulant therapy 05/20/2013  . Alcohol abuse   . Narcotic abuse ([place])   . Marijuana abuse   . Alcoholic cirrhosis ([place])   . DM (diabetes mellitus), type 2 with peripheral vascular complications ([name])     Current Outpatient Medications:  .  albuterol (PROVENTIL HFA;VENTOLIN HFA) 108 (90 Base) MCG/ACT inhaler, Inhale 2 puffs into the lungs every 6 (six) hours as needed for wheezing or shortness of breath., Disp: 18 g, Rfl: 0 .  amiodarone (PACERONE) 100 MG tablet, TAKE 1 TABLET (100 MG TOTAL) BY MOUTH DAILY (Patient taking differently: Take 100 mg by mouth daily. ), Disp: 30 tablet, Rfl: 5 .  amLODipine (NORVASC) 10 MG tablet, Take 1 tablet (10 mg total) by mouth daily., Disp: 30 tablet, Rfl: 5 .  apixaban (ELIQUIS) 5 MG TABS tablet, Take 1 tablet (5 mg total) by mouth 2 (two) times daily., Disp: 60 tablet, Rfl: 0 .  atorvastatin (LIPITOR) 40 MG tablet, TAKE 1 TABLET BY MOUTH ONCE EVERY EVENING (Patient taking differently: Take 40 mg by mouth daily. TAKE 1 TABLET BY MOUTH ONCE EVERY EVENING), Disp: 30 tablet, Rfl: 0 .  bisoprolol (ZEBETA) 5 MG tablet, Take 1 tablet (5 mg total) by mouth daily., Disp: 30 tablet, Rfl: 5 .  cholecalciferol (VITAMIN D) 1000 units tablet, Take 1,000 Units by mouth  daily. , Disp: , Rfl:  .  folic acid (FOLVITE) 1 MG tablet, Take 1 tablet (1 mg total) by mouth daily. (Patient taking differently: Take 1 mg by mouth daily. ), Disp: 30 tablet, Rfl: 0 .  insulin aspart (NOVOLOG) 100 UNIT/ML injection, Inject 30 Units into the skin 3 (three) times daily before meals. (Patient taking differently: Inject 30 Units into the skin 3  (three) times daily before meals. ), Disp: 30 mL, Rfl: 0 .  Insulin Glargine (LANTUS SOLOSTAR) 100 UNIT/ML Solostar Pen, Inject 72 Units into the skin at bedtime., Disp: , Rfl:  .  levothyroxine (SYNTHROID, LEVOTHROID) 50 MCG tablet, Take 1 tablet (50 mcg total) by mouth daily., Disp: 30 tablet, Rfl: 0 .  losartan (COZAAR) 100 MG tablet, Take 1 tablet (100 mg total) by mouth daily. (Patient taking differently: Take 100 mg by mouth at bedtime. ), Disp: 30 tablet, Rfl: 5 .  metolazone (ZAROXOLYN) 2.5 MG tablet, Take 1 tablet (2.5 mg total) by mouth once a week. On Thursdays, Disp: 5 tablet, Rfl: 5 .  Multiple Vitamin (MULTIVITAMIN WITH MINERALS) TABS tablet, Take 1 tablet by mouth daily., Disp: 30 tablet, Rfl: 0 .  potassium chloride SA (K-DUR,KLOR-CON) 20 MEQ tablet, Take 2 tablets (40 mEq total) by mouth daily., Disp: 60 tablet, Rfl: 5 .  spironolactone (ALDACTONE) 25 MG tablet, Take 1 tablet (25 mg total) by mouth daily., Disp: 30 tablet, Rfl: 5 .  thiamine 100 MG tablet, Take 1 tablet (100 mg total) by mouth daily. (Patient taking differently: Take 100 mg by mouth daily. ), Disp: 30 tablet, Rfl: 0 .  torsemide (DEMADEX) 20 MG tablet, Take 40 mg by mouth 2 (two) times daily. , Disp: , Rfl:  .  traZODone (DESYREL) 100 MG tablet, Take 100 mg by mouth at bedtime as needed for sleep., Disp: , Rfl:  .  nicotine (NICODERM CQ) 21 mg/24hr patch, Place 1 patch (21 mg total) onto the skin daily. (Patient not taking: Reported on 11/26/2018), Disp: 28 patch, Rfl: 0 Allergies  Allergen Reactions  . Gabapentin Nausea And Vomiting and Other (See Comments)    POSSIBLE SHAKING? TAKING MED CURRENTLY  . Lyrica [Pregabalin] Other (See Comments)    Shaking Pt still taking lyrica--"probably will stop taking"       Social History   Socioeconomic History  . Marital status: Divorced    Spouse name: Not on file  . Number of children: 1  . Years of education: 28  . Highest education level: 12th grade   Occupational History  . Occupation: disabled  Social Needs  . Financial resource strain: Somewhat hard  . Food insecurity:    Worry: Never true    Inability: Never true  . Transportation needs:    Medical: No    Non-medical: No  Tobacco Use  . Smoking status: Current Some Day Smoker    Packs/day: 1.00    Years: 44.00    Pack years: 44.00    Types: E-cigarettes, Cigarettes  . Smokeless tobacco: Former Systems developer    Types: Snuff  Substance and Sexual Activity  . Alcohol use: Yes    Comment: 11/26/2018 "couple drinks/month maybe"  . Drug use: Yes    Types: "Crack" cocaine, Marijuana, Oxycodone    Comment: 11/26/2018 "last hard drug use was ~ 09/08/2013; still smoke marijuana ~ once/month"  . Sexual activity: Not Currently  Lifestyle  . Physical activity:    Days per week: Not on file    Minutes per session: Not on  file  . Stress: Not on file  Relationships  . Social connections:    Talks on phone: Not on file    Gets together: Not on file    Attends religious service: Not on file    Active member of club or organization: Not on file    Attends meetings of clubs or organizations: Not on file    Relationship status: Not on file  . Intimate partner violence:    Fear of current or ex partner: Not on file    Emotionally abused: Not on file    Physically abused: Not on file    Forced sexual activity: Not on file  Other Topics Concern  . Not on file  Social History Narrative   Lives in [place], in [place] with sister.  They are looking to move but don't have a place to go yet.      Physical Exam      Future Appointments  Date Time Provider [place]  12/17/2018  9:00 AM Edrick Kins, DPM [facility]  12/18/2018 11:00 AM [facility] PA/NP [facility] None  12/25/2018  9:00 AM [name], MD [facility]    BP 132/68   Pulse 80   Resp 16   Wt 235 lb (106.6 kg)   LMP 11/27/2012   SpO2 96%   BMI 42.98 kg/m   Weight yesterday-in hosp  Last visit  weight-242--but she wasn't reporting it correctly CBG PTA-218 in am 149 @ lunch   Pt recently home from the hosp, pt reports she is feeling a lot better. She states the scales she has been using wasn't reading correctly. She states she can now tell the difference when she has the extra fluid on her. I got her to weigh for me during visit and it was 235.  She is struggling with the sodium intake and fluid intake. Still smoking. She get snappy and frustrated to her cousin and nearly got snappy with me when speaking about her diet and food choices. She ate a steak the other night that was marinated in worcestershire sauce. I advised her it was loaded in sodium. I advised her this is in her control, she expressed concern about cost of eating healthy. I offered ways to cut back on her sodium intake without costing a lot of money. She didn't seem too interested in hearing too much.  She needs losartan for refill. 50--p; She denies increased sob. She has productive cough of white phlegm. Her lungs sound better than I have heard from her in several visits but still a slight wheeze to lower base to left side.  meds verified and pill box refilled. Pt denies any swelling, no bloating feeling. She states that per home health she needs to get her doc to send specific orders for care for home health.   Marylouise Stacks, [facility] Paramedic  12/05/18

## 2018-12-06 ENCOUNTER — Telehealth: Payer: Self-pay | Admitting: Adult Health

## 2018-12-06 NOTE — Telephone Encounter (Signed)
° ° °  Mary from [facility] calling for skilled nursing order. Please call [phone]

## 2018-12-07 NOTE — Telephone Encounter (Signed)
Returned call to Larkin Ina states that she wants verbal ok for nursing visits for 4 weeks for wound care and CHF-weight compliance. Informed Stanton Kidney that KL will not be here until Monday to ok-pt is seeing PCP today and she should call there for ok today. She should call back if she still needs orders then.

## 2018-12-09 NOTE — Telephone Encounter (Signed)
She is okay from my standpoint to have wound care, but PCP can be point person

## 2018-12-10 ENCOUNTER — Other Ambulatory Visit (HOSPITAL_COMMUNITY): Payer: Medicare Other

## 2018-12-12 ENCOUNTER — Other Ambulatory Visit (HOSPITAL_COMMUNITY): Payer: Self-pay

## 2018-12-12 NOTE — Progress Notes (Signed)
Paramedicine Encounter    Patient ID: [name], female    DOB: 06-Jun-1961, 58 y.o.   MRN: [id number]   Patient Care Team: Tsosie Billing, MD as PCP - General (Internal Medicine) [name], MD as PCP - Cardiology (Cardiology) [name], MD as Consulting Physician (Cardiology) [name], MD as Consulting Physician (Pulmonary Disease) Jorge Ny, LCSW as Social Worker (Licensed Clinical Social Worker)  Patient Active Problem List   Diagnosis Date Noted  . Acute CHF (congestive heart failure) ([name]) 11/26/2018  . CHF (congestive heart failure) ([place]) 11/26/2018  . Acute respiratory failure ([place]) 10/21/2018  . AKI (acute kidney injury) ([place]) 08/18/2018  . Coagulation disorder ([place]) 08/09/2017  . Depression 07/21/2017  . At risk for adverse drug reaction 06/20/2017  . Peripheral neuropathy 06/20/2017  . Acute osteomyelitis of right foot ([place]) 06/13/2017  . S/P transmetatarsal amputation of foot, right ([place]) 06/05/2017  . 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 PAD (peripheral artery disease) ([place])   . SIRS (systemic inflammatory response syndrome) ([place]) 04/06/2017  . CKD (chronic kidney disease), stage III ([place]) 11/24/2016  . Suspected sleep apnea 11/24/2016  . Severe obesity (BMI >= 40) ([place]) 02/24/2016  . COPD GOLD 0  02/24/2016  . Chronic systolic CHF (congestive heart failure) ([place]) 02/12/2016  . Encounter for therapeutic drug monitoring 02/10/2016  . Symptomatic bradycardia 01/12/2016  . Essential hypertension 12/22/2015  . Acute on chronic combined systolic and diastolic CHF (congestive heart failure) ([place])   . Wheeze   . 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 Tobacco abuse 10/23/2015  . Coronary artery disease   . DOE (dyspnea on exertion) 04/29/2015  . PAF (paroxysmal atrial fibrillation) ([place]) 01/16/2015  . Carotid arterial disease ([place]) 01/16/2015  .  Claudication ([place]) 01/15/2015  . Demand ischemia ([place]) 10/29/2014  . Insomnia 02/03/2014  . S/P peripheral artery angioplasty - [place]bo[place] atherectomy; R SFA 09/11/2013    Class: Acute  . Leg pain, bilateral 08/19/2013  . Hypothyroidism 07/31/2013  . Cellulitis 06/13/2013  . History of cocaine abuse ([place]) 06/13/2013  . Long term current use of anticoagulant therapy 05/20/2013  . Alcohol abuse   . Narcotic abuse ([place])   . Marijuana abuse   . Alcoholic cirrhosis ([place])   . DM (diabetes mellitus), type 2 with peripheral vascular complications ([name])     Current Outpatient Medications:  .  albuterol (PROVENTIL HFA;VENTOLIN HFA) 108 (90 Base) MCG/ACT inhaler, Inhale 2 puffs into the lungs every 6 (six) hours as needed for wheezing or shortness of breath., Disp: 18 g, Rfl: 0 .  amiodarone (PACERONE) 100 MG tablet, TAKE 1 TABLET (100 MG TOTAL) BY MOUTH DAILY (Patient taking differently: Take 100 mg by mouth daily. ), Disp: 30 tablet, Rfl: 5 .  amLODipine (NORVASC) 10 MG tablet, Take 1 tablet (10 mg total) by mouth daily., Disp: 30 tablet, Rfl: 5 .  apixaban (ELIQUIS) 5 MG TABS tablet, Take 1 tablet (5 mg total) by mouth 2 (two) times daily., Disp: 60 tablet, Rfl: 0 .  atorvastatin (LIPITOR) 40 MG tablet, TAKE 1 TABLET BY MOUTH ONCE EVERY EVENING (Patient taking differently: Take 40 mg by mouth daily. TAKE 1 TABLET BY MOUTH ONCE EVERY EVENING), Disp: 30 tablet, Rfl: 0 .  bisoprolol (ZEBETA) 5 MG tablet, Take 1 tablet (5 mg total) by mouth daily., Disp: 30 tablet, Rfl: 5 .  cholecalciferol (VITAMIN D) 1000 units tablet, Take 1,000 Units by mouth  daily. , Disp: , Rfl:  .  folic acid (FOLVITE) 1 MG tablet, Take 1 tablet (1 mg total) by mouth daily. (Patient taking differently: Take 1 mg by mouth daily. ), Disp: 30 tablet, Rfl: 0 .  insulin aspart (NOVOLOG) 100 UNIT/ML injection, Inject 30 Units into the skin 3 (three) times daily before meals. (Patient taking differently: Inject 30 Units into the skin 3  (three) times daily before meals. ), Disp: 30 mL, Rfl: 0 .  Insulin Glargine (LANTUS SOLOSTAR) 100 UNIT/ML Solostar Pen, Inject 72 Units into the skin at bedtime., Disp: , Rfl:  .  levothyroxine (SYNTHROID, LEVOTHROID) 50 MCG tablet, Take 1 tablet (50 mcg total) by mouth daily., Disp: 30 tablet, Rfl: 0 .  losartan (COZAAR) 100 MG tablet, Take 1 tablet (100 mg total) by mouth daily. (Patient taking differently: Take 100 mg by mouth at bedtime. ), Disp: 30 tablet, Rfl: 5 .  metolazone (ZAROXOLYN) 2.5 MG tablet, Take 1 tablet (2.5 mg total) by mouth once a week. On Thursdays, Disp: 5 tablet, Rfl: 5 .  Multiple Vitamin (MULTIVITAMIN WITH MINERALS) TABS tablet, Take 1 tablet by mouth daily., Disp: 30 tablet, Rfl: 0 .  potassium chloride SA (K-DUR,KLOR-CON) 20 MEQ tablet, Take 2 tablets (40 mEq total) by mouth daily., Disp: 60 tablet, Rfl: 5 .  spironolactone (ALDACTONE) 25 MG tablet, Take 1 tablet (25 mg total) by mouth daily., Disp: 30 tablet, Rfl: 5 .  thiamine 100 MG tablet, Take 1 tablet (100 mg total) by mouth daily. (Patient taking differently: Take 100 mg by mouth daily. ), Disp: 30 tablet, Rfl: 0 .  torsemide (DEMADEX) 20 MG tablet, Take 40 mg by mouth 2 (two) times daily. , Disp: , Rfl:  .  traZODone (DESYREL) 100 MG tablet, Take 100 mg by mouth at bedtime as needed for sleep., Disp: , Rfl:  .  nicotine (NICODERM CQ) 21 mg/24hr patch, Place 1 patch (21 mg total) onto the skin daily. (Patient not taking: Reported on 11/26/2018), Disp: 28 patch, Rfl: 0 Allergies  Allergen Reactions  . Gabapentin Nausea And Vomiting and Other (See Comments)    POSSIBLE SHAKING? TAKING MED CURRENTLY  . Lyrica [Pregabalin] Other (See Comments)    Shaking Pt still taking lyrica--"probably will stop taking"       Social History   Socioeconomic History  . Marital status: Divorced    Spouse name: Not on file  . Number of children: 1  . Years of education: 54  . Highest education level: 12th grade   Occupational History  . Occupation: disabled  Social Needs  . Financial resource strain: Somewhat hard  . Food insecurity:    Worry: Never true    Inability: Never true  . Transportation needs:    Medical: No    Non-medical: No  Tobacco Use  . Smoking status: Current Some Day Smoker    Packs/day: 1.00    Years: 44.00    Pack years: 44.00    Types: E-cigarettes, Cigarettes  . Smokeless tobacco: Former Systems developer    Types: Snuff  Substance and Sexual Activity  . Alcohol use: Yes    Comment: 11/26/2018 "couple drinks/month maybe"  . Drug use: Yes    Types: "Crack" cocaine, Marijuana, Oxycodone    Comment: 11/26/2018 "last hard drug use was ~ 09/08/2013; still smoke marijuana ~ once/month"  . Sexual activity: Not Currently  Lifestyle  . Physical activity:    Days per week: Not on file    Minutes per session: Not on  file  . Stress: Not on file  Relationships  . Social connections:    Talks on phone: Not on file    Gets together: Not on file    Attends religious service: Not on file    Active member of club or organization: Not on file    Attends meetings of clubs or organizations: Not on file    Relationship status: Not on file  . Intimate partner violence:    Fear of current or ex partner: Not on file    Emotionally abused: Not on file    Physically abused: Not on file    Forced sexual activity: Not on file  Other Topics Concern  . Not on file  Social History Narrative   Lives in [place], in [place] with sister.  They are looking to move but don't have a place to go yet.      Physical Exam      Future Appointments  Date Time Provider [place]  12/17/2018  9:00 AM Edrick Kins, DPM [facility]  12/18/2018 11:00 AM [facility] PA/NP [facility] None  12/25/2018  9:00 AM [name], MD [facility]    BP 132/68   Pulse 70   Resp 16   Wt 236 lb (107 kg)   LMP 11/27/2012   BMI 43.16 kg/m   Weight yesterday-235 Last visit weight-235 CBG-147  Pt  reports she is doing ok, she went to PCP today-she reports doc said her A1C was better, but no paperwork to see exact level. Family reports she has been dropping pills a lot and finding them on the floor. will look into bubble packs from pharmacy as I think that will be easier for you to handle and likely less dropping of pills. Pt lungs sound clear today, I havent heard her lungs this clear in a long time. Still smoking.  Pt denies any increased sob. Weight maintaining same. Her family brought home [facility] for dinner. As much as I have spoken about fried foods and sodium intake etc she still will continue to eat what she wants and the cousin that she lives with also is on fixed income and buys these convenience foods . She had a steak the day after she was d/c from hosp with CHF issues. I think right now just try to increase her ability to take all her meds correctly and work on that. So I will speak with pharmacy about starting the bubble packs.  meds verified and pill box refilled.   Marylouise Stacks, [facility] Paramedic  12/12/18

## 2018-12-17 ENCOUNTER — Encounter: Payer: Self-pay | Admitting: Podiatry

## 2018-12-17 ENCOUNTER — Ambulatory Visit (INDEPENDENT_AMBULATORY_CARE_PROVIDER_SITE_OTHER): Payer: Medicare Other | Admitting: Podiatry

## 2018-12-17 VITALS — BP 122/52

## 2018-12-17 DIAGNOSIS — M79676 Pain in unspecified toe(s): Secondary | ICD-10-CM | POA: Diagnosis not present

## 2018-12-17 DIAGNOSIS — B351 Tinea unguium: Secondary | ICD-10-CM | POA: Diagnosis not present

## 2018-12-17 DIAGNOSIS — I739 Peripheral vascular disease, unspecified: Secondary | ICD-10-CM

## 2018-12-17 DIAGNOSIS — E0843 Diabetes mellitus due to underlying condition with diabetic autonomic (poly)neuropathy: Secondary | ICD-10-CM | POA: Diagnosis not present

## 2018-12-18 ENCOUNTER — Ambulatory Visit (HOSPITAL_COMMUNITY)
Admission: RE | Admit: 2018-12-18 | Discharge: 2018-12-18 | Disposition: A | Discharge status: Home / Self Care | Payer: Medicare Other | Source: Ambulatory Visit | Attending: Internal Medicine | Admitting: Internal Medicine

## 2018-12-18 ENCOUNTER — Other Ambulatory Visit (HOSPITAL_COMMUNITY): Payer: Self-pay

## 2018-12-18 ENCOUNTER — Encounter (HOSPITAL_COMMUNITY): Payer: Self-pay

## 2018-12-18 VITALS — BP 124/78 | HR 86 | Wt 237.2 lb

## 2018-12-18 DIAGNOSIS — Z720 Tobacco use: Secondary | ICD-10-CM

## 2018-12-18 DIAGNOSIS — Z9981 Dependence on supplemental oxygen: Secondary | ICD-10-CM | POA: Diagnosis not present

## 2018-12-18 DIAGNOSIS — G4733 Obstructive sleep apnea (adult) (pediatric): Secondary | ICD-10-CM | POA: Insufficient documentation

## 2018-12-18 DIAGNOSIS — Z79899 Other long term (current) drug therapy: Secondary | ICD-10-CM | POA: Diagnosis not present

## 2018-12-18 DIAGNOSIS — Z8249 Family history of ischemic heart disease and other diseases of the circulatory system: Secondary | ICD-10-CM | POA: Insufficient documentation

## 2018-12-18 DIAGNOSIS — Z794 Long term (current) use of insulin: Secondary | ICD-10-CM | POA: Insufficient documentation

## 2018-12-18 DIAGNOSIS — I509 Heart failure, unspecified: Secondary | ICD-10-CM

## 2018-12-18 DIAGNOSIS — L03115 Cellulitis of right lower limb: Secondary | ICD-10-CM

## 2018-12-18 DIAGNOSIS — Z7901 Long term (current) use of anticoagulants: Secondary | ICD-10-CM | POA: Insufficient documentation

## 2018-12-18 DIAGNOSIS — I5022 Chronic systolic (congestive) heart failure: Secondary | ICD-10-CM | POA: Insufficient documentation

## 2018-12-18 DIAGNOSIS — E1151 Type 2 diabetes mellitus with diabetic peripheral angiopathy without gangrene: Secondary | ICD-10-CM | POA: Insufficient documentation

## 2018-12-18 DIAGNOSIS — J449 Chronic obstructive pulmonary disease, unspecified: Secondary | ICD-10-CM | POA: Diagnosis not present

## 2018-12-18 DIAGNOSIS — Z87891 Personal history of nicotine dependence: Secondary | ICD-10-CM | POA: Diagnosis not present

## 2018-12-18 DIAGNOSIS — I480 Paroxysmal atrial fibrillation: Secondary | ICD-10-CM | POA: Diagnosis not present

## 2018-12-18 DIAGNOSIS — K7460 Unspecified cirrhosis of liver: Secondary | ICD-10-CM | POA: Diagnosis not present

## 2018-12-18 DIAGNOSIS — I2510 Atherosclerotic heart disease of native coronary artery without angina pectoris: Secondary | ICD-10-CM | POA: Insufficient documentation

## 2018-12-18 DIAGNOSIS — R001 Bradycardia, unspecified: Secondary | ICD-10-CM | POA: Diagnosis not present

## 2018-12-18 DIAGNOSIS — E1122 Type 2 diabetes mellitus with diabetic chronic kidney disease: Secondary | ICD-10-CM | POA: Diagnosis not present

## 2018-12-18 DIAGNOSIS — N183 Chronic kidney disease, stage 3 (moderate): Secondary | ICD-10-CM | POA: Diagnosis not present

## 2018-12-18 DIAGNOSIS — N179 Acute kidney failure, unspecified: Secondary | ICD-10-CM | POA: Diagnosis not present

## 2018-12-18 DIAGNOSIS — I130 Hypertensive heart and chronic kidney disease with heart failure and stage 1 through stage 4 chronic kidney disease, or unspecified chronic kidney disease: Secondary | ICD-10-CM | POA: Insufficient documentation

## 2018-12-18 DIAGNOSIS — E039 Hypothyroidism, unspecified: Secondary | ICD-10-CM | POA: Insufficient documentation

## 2018-12-18 LAB — BASIC METABOLIC PANEL
Anion gap: 13 (ref 5–15)
BUN: 46 mg/dL — ABNORMAL HIGH (ref 6–20)
CO2: 27 mmol/L (ref 22–32)
Calcium: 9 mg/dL (ref 8.9–10.3)
Chloride: 102 mmol/L (ref 98–111)
Creatinine, Ser: 1.59 mg/dL — ABNORMAL HIGH (ref 0.44–1.00)
GFR calc Af Amer: 42 mL/min — ABNORMAL LOW (ref >60)
GFR calc non Af Amer: 36 mL/min — ABNORMAL LOW (ref >60)
Glucose, Bld: 182 mg/dL — ABNORMAL HIGH (ref 70–99)
Potassium: 4.2 mmol/L (ref 3.5–5.1)
Sodium: 142 mmol/L (ref 135–145)

## 2018-12-18 MED ORDER — DOXYCYCLINE HYCLATE 50 MG PO CAPS: 100.0000 mg | ORAL_CAPSULE | Freq: Two times a day (BID) | ORAL | 0 refills | Status: DC

## 2018-12-18 NOTE — Patient Instructions (Signed)
Labs done today. We will contact you with any abnormal labs.  START Doxycycline 100mg  (2 tabs) twice daily for 7 days only.  Follow up with the Advanced Practice Provider in 6 weeks.

## 2018-12-18 NOTE — Progress Notes (Signed)
Paramedicine Encounter   Patient ID: [name] , female,   DOB: 1961-10-02,57 y.o.,  MRN: [id number]   Met patient in clinic today with provider.  Time spent with patient 18mn  B/P-124/78 P-86 [id number] Weight @ clinic-237 Weight @ home-235  She reports she isnt feeling well, she is tired all the time, doesn't feel like she is getting her rest out. She is not sleeping in her bed, she has been sleeping in chair.  Her legs are very painful, her legs are reddened.  She is getting ready to move to a house with her and her cousin. They are awaiting to hear back  Labs today.  She sees dr berry next week.  She is going to put on doxycycline for her legs.  Right now she is getting transportation through her insurance company, once those visits run out she will need another option. I advised her about [facility] and she reports that she has tried with them in the past but she wasn't able to get the additional info needed for the interview and never followed back up.  No med changes today so I will call pharmacy to see when he can start the bubble packs.  She blames the most of her not being able to be compliant with her diet on her cousin.   [name] ENew Ross2/02/2019   ACTION: Home visit completed

## 2018-12-18 NOTE — Progress Notes (Signed)
Patient ID: JEANIE MCCARD, female   DOB: 12-12-1960, 58 y.o.   MRN: [id number]    Advanced Heart Failure Clinic Note   PCP: Dr Roxy Manns HF Cardiology: Dr. Aundra Dubin PV: Dr. Gwenlyn Found  Ms Mizner is a 58 year old with history of PAD, carotid stenosis s/p left CEA, relatively mild CAD, chronic systolic CHF, paroxysmal atrial fibrillation and prior substance abuse presents for CHF clinic followup.  She was admitted in 1/17 with acute hypoxemic respiratory failure in the setting of atrial fibrillation/RVR and volume overload.  She was initially intubated.  She converted back to NSR with amiodarone gtt.  She was treated with IV Lasix, steroids, bronchodilators.  She developed AKI and losartan was stopped.    She was admitted in 3/17 with symptomatic bradycardia.  She was supposed to stop diltiazem after this admission but continued to take it.  She presented later in 3/17, again with symptomatic bradycardia (junctional bradycardia), hypotension, and AKI.  Diltiazem and bisoprolol were stopped.  HR recovered and she has had no problems since.  ACEI was stopped with AKI.   In 5/18, she had peripheral angiography with atherectomy of right SFA.  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 7/18 ABIs showed 0.65 on right, 0.31 on left.    Admitted 10/5 through 08/19/2018 with alcohol intoxication and passed out in the parking lot. EMS was called. She did not have any fractures. Torsemide, spironolactone, metolazone was stopped due to AKI.  She was discharged the next day.   Admitted in 11/26/18 with marked volume overload in the setting of noncompliance with medications and diet. Diuresed with IV lasix and transitioned to torsemide 40 mg twice a day. Hospital course was complicated by AKI. Discharge weight was 236 pounds.   Today she returns for post hospital follow up. Overall feeling ok but complaining of pain in her legs.  Denies PND/Orthopnea. SOB with steps.  Denies chest pain. No bleeding problems. Appetite ok. No fever or chills. Currently not drinking alcohol. Weight at home 232-234  Pounds.Contiues to smoke daily.  Taking all medications.    Labs (1/17): K 5.2, creatinine 1.4, AST 43, ALT normal Labs (2/17): K 4.2, creatinine 1.3, BNP 607, TSH normal Labs (3/17): K 4.2, creatinine 1.45, AST/ALT normal, HCT 28.7 Labs (4/17): K 4.4, creatinine 1.61 Labs (08/31/2016): K 4.5 Creatinine 1.43  Labs (11/17): K 4.2, creatinine 1.4 Labs (9/18): K 4.1, creatinine 1.1, TSH normal Labs 04/13/2018: K 5 Creatinine 1.75 Kabs 08/19/2018: K 4.3 Creatinine 2.07   PMH: 1. Carotid stenosis: Known occluded right carotid.  Left CEA in 4/16.  2. CAD: LHC in 12/15 with 80% stenosis in small OM1, nonobstructive disease in other territories.  3. Chronic systolic CHF: Nonischemic cardiomyopathy (?due to ETOH or prior drug abuse).  - Echo (1/17) with EF 45%, mild LV hypertrophy, moderate diastolic dysfunction, inferolateral severe hypokinesis, mildly decreased RV systolic function.  - Echo (1/18): EF 40-45%, mild LVH, normal RV size and systolic function.  4. Atrial fibrillation: Paroxysmal.   5. Type II diabetes 6. CKD: Stage III. 7. COPD: Quit smoking 1/17. On home oxygen.  8. 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  No longer drinks.  9. Hypothyroidism 10. PAD: Atherectomy SFA in 2014 (Dr Gwenlyn Found).  Peripheral arterial dopplers (2/17) with focal 75-99% proximal right SFA stenosis, occluded mid-distal right SFA, chronic occlusion of all runoff arteries on right. - In 5/18, she had peripheral angiography with atherectomy of right SFA.   -  In 6/18, she had right 2nd ray resection later followed by transmetatarsal amputation on right.  - 7/18 ABIs showed 0.65 on right, 0.31 on left.   11. Anemia 12. Prior cocaine abuse.  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   SH: Lives with sister.  Prior cocaine abuse.  Prior ETOH abuse.  Quit smoking  in 1/17, restarted, and quit again in 4/19.  FH: CAD  Review of systems complete and found to be negative unless listed in HPI.    Current Outpatient Medications  Medication Sig Dispense Refill  . amiodarone (PACERONE) 100 MG tablet TAKE 1 TABLET (100 MG TOTAL) BY MOUTH DAILY (Patient taking differently: Take 100 mg by mouth daily. ) 30 tablet 5  . amLODipine (NORVASC) 10 MG tablet Take 1 tablet (10 mg total) by mouth daily. 30 tablet 5  . apixaban (ELIQUIS) 5 MG TABS tablet Take 1 tablet (5 mg total) by mouth 2 (two) times daily. 60 tablet 0  . atorvastatin (LIPITOR) 40 MG tablet TAKE 1 TABLET BY MOUTH ONCE EVERY EVENING (Patient taking differently: Take 40 mg by mouth daily. TAKE 1 TABLET BY MOUTH ONCE EVERY EVENING) 30 tablet 0  . bisoprolol (ZEBETA) 5 MG tablet Take 1 tablet (5 mg total) by mouth daily. 30 tablet 5  . cholecalciferol (VITAMIN D) 1000 units tablet Take 1,000 Units by mouth daily.     . clopidogrel (PLAVIX) 75 MG tablet Take 75 mg by mouth daily.    . diclofenac sodium (VOLTAREN) 1 % GEL APPLY 4 GRAMS TO THE AFFECTED AREAS TOPICALLY  4 TIMES PER DAY    . folic acid (FOLVITE) 1 MG tablet Take 1 tablet (1 mg total) by mouth daily. (Patient taking differently: Take 1 mg by mouth daily. ) 30 tablet 0  . insulin aspart (NOVOLOG) 100 UNIT/ML injection Inject 30 Units into the skin 3 (three) times daily before meals. (Patient taking differently: Inject 30 Units into the skin 3 (three) times daily before meals. ) 30 mL 0  . Insulin Glargine (LANTUS SOLOSTAR) 100 UNIT/ML Solostar Pen Inject 72 Units into the skin at bedtime.    [name] levothyroxine (SYNTHROID, LEVOTHROID) 50 MCG tablet Take 1 tablet (50 mcg total) by mouth daily. 30 tablet 0  . losartan (COZAAR) 100 MG tablet Take 1 tablet (100 mg total) by mouth daily. (Patient taking differently: Take 100 mg by mouth at bedtime. ) 30 tablet 5  . metolazone (ZAROXOLYN) 2.5 MG tablet Take 1 tablet (2.5 mg total) by mouth once a week. On  Thursdays 5 tablet 5  . Multiple Vitamin (MULTIVITAMIN WITH MINERALS) TABS tablet Take 1 tablet by mouth daily. 30 tablet 0  . potassium chloride SA (K-DUR,KLOR-CON) 20 MEQ tablet Take 2 tablets (40 mEq total) by mouth daily. 60 tablet 5  . spironolactone (ALDACTONE) 25 MG tablet Take 1 tablet (25 mg total) by mouth daily. 30 tablet 5  . thiamine 100 MG tablet Take 1 tablet (100 mg total) by mouth daily. (Patient taking differently: Take 100 mg by mouth daily. ) 30 tablet 0  . torsemide (DEMADEX) 20 MG tablet Take 40 mg by mouth 2 (two) times daily.     . traZODone (DESYREL) 100 MG tablet Take 100 mg by mouth at bedtime as needed for sleep.    [name] albuterol (PROVENTIL HFA;VENTOLIN HFA) 108 (90 Base) MCG/ACT inhaler Inhale 2 puffs into the lungs every 6 (six) hours as needed for wheezing or shortness of breath. 18 g 0  No current facility-administered medications for this encounter.    Vitals:   12/18/18 1042  BP: 124/78  Pulse: 86  SpO2: 93%  Weight: 107.6 kg (237 lb 3.2 oz)     Wt Readings from Last 3 Encounters:  12/18/18 107.6 kg (237 lb 3.2 oz)  12/12/18 107 kg (236 lb)  12/05/18 106.6 kg (235 lb)    Physical Exam  General:  Arrived in wheel chair. No resp difficulty HEENT: normal Neck: supple. no JVD. Carotids 2+ bilat; no bruits. No lymphadenopathy or thryomegaly appreciated. Cor: PMI nondisplaced. Regular rate & rhythm. No rubs, gallops or murmurs. Lungs: clear Abdomen: soft, nontender, nondistended. No hepatosplenomegaly. No bruits or masses. Good bowel sounds. Extremities: no cyanosis, clubbing, rash, R and LLE erythema. No edema.  Neuro: alert & orientedx3, cranial nerves grossly intact. moves all 4 extremities w/o difficulty. Affect pleasant  Assessment/Plan: 1. Chronic systolic CHF: Has been presumed nonischemic given LHC in 12/15 showing only 80% stenosis in small OM1.  ECHO 10/2018 EF 45-50% Grade II DD  NYHA IIIb. Volume status stable. Continue torsemide 40 mg twice  a day + metolazone weekly. Check BMET today.  - Continue bisoprolol to 5 mg daily.  - Continue losartan 100 mg daily. -Continue spironolactone 25 mg daily. Reinforced medication compliance and limiting fluids to < 2 liters per day.    2. Atrial fibrillation: Paroxysmal.   - Continue amiodarone 100 mg daily.Regular on exam.   LFTs stable earlier this month. PCP following hypothyroidism (on Levoxyl).   Needs regular eye exams. - Continue eliquis 5 mg twice a day. No bleeding problems.   3. PAD: Claudication on left seems limiting.  No ulcerations.  She has foot pain almost constantly.  Not candidate for cilostazol with CHF.  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  - Peripheral angiography deferred with AKI. Followed  Has follow up with Dr Gwenlyn Found   4. COPD: No wheeze. Discussed smoking cessaton.   5. H/o cirrhosis:  6. OSA: - Unable to tolerate CPAP.  7. CKD stage III:   -Check BMEt  8. HTN:  Stable.  9. Bradycardia:  - Junctional rhythm when hospitalized in 3/17. Resolved. 10. Cellulitis  RLE. Start doxycycline 100 mg twice a day for 7 days.  11. Smoker  Discussed smoking cessation.     Check BMET today. Continue HF Paramedicine. Follow up in 6 weeks.     Darrick Grinder, NP  12/18/2018

## 2018-12-19 ENCOUNTER — Other Ambulatory Visit (HOSPITAL_COMMUNITY): Payer: Self-pay

## 2018-12-19 NOTE — Progress Notes (Signed)
Came out today for med rec and pill box filling.  meds verified and med bottles was taken to pharmacy for bubble packing.  She did start the doxy last night.   Marylouise Stacks, EMT-Paramedic 12/19/18

## 2018-12-20 NOTE — Progress Notes (Signed)
SUBJECTIVE Patient with a history of diabetes mellitus presents to office today complaining of elongated, thickened nails that cause pain while ambulating in shoes. She is unable to trim her own nails.  She reports tightening, squeezing and stabbing pain on the dorsum of the feet bilaterally that has been gradually worsening over the past 5 years. There are no modifying factors noted. She has been applying OTC foot cream for treatment. Patient is here for further evaluation and treatment.   Past Medical History:  Diagnosis Date  . Alcohol abuse   . Alcoholic cirrhosis ([place])   . Anemia   . Anxiety   . Arthritis    "knees" (11/26/2018)  . B12 deficiency   . CAD (coronary artery disease)    a. cath 11/10/2014 LM nl, LAD min irregs, D1 30 ost, D2 50d, LCX 70m, OM1 80 p/m (1.5 mm vessel), OM2 63m, RCA nondom 61m-->med rx.. Demand ischemia in the setting of rapid a-fib.  . Cardiomyopathy ([place])   . Carotid artery disease ([place])    a. 01/2015 Carotid Angio: RICA 606, LICA 30Z. s/p L carotid endarterectomy 02/2015.  . Cellulitis    lower extremities  . Chronic combined systolic and diastolic CHF (congestive heart failure) ([place])    a. Last echo 12/205: EF 40-45%, inferoapical/posterior HK, not technically sufficient to allow eval of LV diastolic function, normal LA in size..  . Cocaine abuse ([place])   . COPD (chronic obstructive pulmonary disease) ([place])   . Critical lower limb ischemia   . Depression   . Diabetic peripheral neuropathy ([place])   . Elevated troponin    a. Chronic elevation.  [name] GERD (gastroesophageal reflux disease)   . Hyperlipemia   . Hypertension   . Hypokalemia   . Hypomagnesemia   . Hypothyroidism   . Marijuana abuse   . Narcotic abuse ([place])   . Noncompliance   . NSVT (nonsustained ventricular tachycardia) ([place])   . Obesity   . PAF (paroxysmal atrial fibrillation) ([place])   . Paroxysmal atrial tachycardia ([place])   . Peripheral arterial disease ([place])    a. 01/2015  Angio/PTA: LSFA 100 w/ recon @ adductor canal and 1 vessel runoff via AT, RSFA 99 (atherectomy/pta) - 1 vessel runoff via diff dzs peroneal.  . Pneumonia    "once or twice" (11/26/2018)  . Poorly controlled type 2 diabetes mellitus ([place])   . Renal insufficiency    a. 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  [name] Sleep apnea    "couldn't handle wearing the mask" (11/26/2018)  . Symptomatic bradycardia    avoid AV blocking agent per EP  . Tobacco abuse     OBJECTIVE General Patient is awake, alert, and oriented x 3 and in no acute distress. Derm Skin is dry and supple bilateral. Negative open lesions or macerations. Remaining integument unremarkable. Nails are tender, long, thickened and dystrophic with subungual debris, consistent with onychomycosis, 1-5 bilateral. No signs of infection noted. Vasc  DP and PT pedal pulses diminished bilaterally. Temperature gradient within normal limits.  Neuro Epicritic and protective threshold sensation absent bilaterally.  Musculoskeletal Exam TMA of the right foot noted. No symptomatic pedal deformities noted bilateral. Muscular strength within normal limits.  ASSESSMENT 1. Diabetes Mellitus w/ peripheral neuropathy 2. Onychomycosis of nail due to dermatophyte bilateral 3. H.o TMA right foot 4. PVD BLE  PLAN OF CARE 1. Patient evaluated today. 2. Instructed to maintain good pedal hygiene and foot care. Stressed importance of controlling blood sugar.  3. Mechanical debridement of nails 1-5 bilaterally  performed using a nail nipper. Filed with dremel without incident.  4. Continue care with vascular for chronic PVD.  5. Appointment with Liliane Channel, Pedorthist, for DM shoes and insoles and toe filler right.  6. Return to clinic in 3 mos for routine care.     Edrick Kins, DPM [facility]  Dr. Edrick Kins, [address]                                        [place], [place] [address]                Office [phone]  Fax [phone]

## 2018-12-24 ENCOUNTER — Telehealth (HOSPITAL_COMMUNITY): Payer: Self-pay

## 2018-12-24 ENCOUNTER — Ambulatory Visit: Payer: Medicare Other | Admitting: Orthotics

## 2018-12-24 DIAGNOSIS — E0843 Diabetes mellitus due to underlying condition with diabetic autonomic (poly)neuropathy: Secondary | ICD-10-CM

## 2018-12-24 DIAGNOSIS — B351 Tinea unguium: Secondary | ICD-10-CM

## 2018-12-24 DIAGNOSIS — I739 Peripheral vascular disease, unspecified: Secondary | ICD-10-CM

## 2018-12-24 DIAGNOSIS — M79676 Pain in unspecified toe(s): Secondary | ICD-10-CM

## 2018-12-24 DIAGNOSIS — Z89431 Acquired absence of right foot: Secondary | ICD-10-CM

## 2018-12-24 NOTE — Telephone Encounter (Signed)
Spoke to pt regarding her bubble packs and pharmacy has not delivered them out yet. I called pharmacy and he reports will fill them in the morning and deliver it out tomor as well.   Marylouise Stacks, EMT-Paramedic  12/24/18

## 2018-12-24 NOTE — Progress Notes (Signed)
Patient was measured for med necessity extra depth shoes (x2) w/ 3 pair (x3 custom f/o and 1 L5000 toe filler for trasmet R   Patient was measured w/ brannock to determine size and width.  Foam impression mold was achieved and deemed appropriate for fabrication of  cmfo.   See DPM chart notes for further documentation and dx codes for determination of medical necessity.  Appropriate forms will be sent to PCP to verify and sign off on medical necessity.

## 2018-12-25 ENCOUNTER — Encounter: Payer: Self-pay | Admitting: Cardiovascular Disease

## 2018-12-25 ENCOUNTER — Other Ambulatory Visit (HOSPITAL_COMMUNITY): Payer: Self-pay

## 2018-12-25 ENCOUNTER — Ambulatory Visit (INDEPENDENT_AMBULATORY_CARE_PROVIDER_SITE_OTHER): Payer: Medicare Other | Admitting: Cardiovascular Disease

## 2018-12-25 VITALS — BP 126/64 | HR 78 | Ht 64.0 in | Wt 232.8 lb

## 2018-12-25 DIAGNOSIS — I480 Paroxysmal atrial fibrillation: Secondary | ICD-10-CM

## 2018-12-25 DIAGNOSIS — I739 Peripheral vascular disease, unspecified: Secondary | ICD-10-CM

## 2018-12-25 DIAGNOSIS — I6521 Occlusion and stenosis of right carotid artery: Secondary | ICD-10-CM | POA: Diagnosis not present

## 2018-12-25 DIAGNOSIS — Z720 Tobacco use: Secondary | ICD-10-CM | POA: Diagnosis not present

## 2018-12-25 DIAGNOSIS — I10 Essential (primary) hypertension: Secondary | ICD-10-CM

## 2018-12-25 MED ORDER — TORSEMIDE 20 MG PO TABS: 40.0000 mg | ORAL_TABLET | Freq: Two times a day (BID) | ORAL | 6 refills | Status: DC

## 2018-12-25 NOTE — Assessment & Plan Note (Signed)
History of PAF maintaining sinus rhythm on low-dose amiodarone and Eliquis oral anticoagulation.

## 2018-12-25 NOTE — Assessment & Plan Note (Signed)
History of essential hypertension blood pressure measured today 126/64.  She is on amlodipine, Zebeta and losartan.  Continue current meds at current dosing.

## 2018-12-25 NOTE — Assessment & Plan Note (Signed)
History of ongoing tobacco abuse of 1/2 pack/day recalcitrant risk factor modification. 

## 2018-12-25 NOTE — Progress Notes (Signed)
12/25/2018 [name]   1961-09-16  [id number]  Primary Physician Tsosie Billing, MD Primary Cardiologist: [name] MD FACP, [place], [place], [place]  HPI:  [name] is a 58 y.o.  moderately overweight single Caucasian female mother of one child who lives with her sister. She was originally referred by Dr. Franki Monte at [facility] for peripheral vascular evaluation because of critical limb ischemia. I last saw her in the office  03/28/2018. She sees Dr. Jenkins Rouge at [facility] for cardiomyopathy and paroxysmal atrial fibrillation. Her cardiac risk factors include a 20-pack-year history of tobacco abuse (she has stopped smoking), treated diabetes, and hypertension as well as family history of heart disease. She's never had a heart attack or stroke. She does have COPD. She had paroxysmal atrial fibrillation in the past and has undergone cardioversion in [place].. The lower extremity arterial Doppler studies performed in our office suggested critical limb ischemia with an occluded SFA and popliteal arteries bilaterally with tibial vessel disease as well. She complains of lifestyle limiting claudication. I performed lower extremity angiography on 09/10/13 revealing occluded SFAs bilaterally with chronic occluded tibial vessels as well. I performed [employer]bo[employer]awk directional atherectomy of her entire right SFA with an excellent angiographic result removing a copious amount of atherosclerotic plaque. The ulcer on her right heel ultimately healed. She does continue to smoke one pack per day. She was recently admitted to [facility] because of atrial fibrillation with rapid ventricular response. She underwent cardiac catheterization on 10/31/14 revealing an ejection fraction of 30-35% with disease that was ultimately treated medically. Her carotid Doppler showed high-grade bilateral ICA disease and lower extremity Dopplers revealed restenosis within the right SFA  with a ABI of 0.34. She is symptomatic on that side. I performed angiography on her 01/15/15 revealing an occluded right internal carotid artery, 95% proximal left internal carotid artery stenosis, long 99% stenosis within the right SFA and an occluded left SFA. I performed [facility] 1 directional atherectomy and drug-eluting balloon angioplasty of her right SFA with an excellent Angiographic and clinical result. She underwent left carotid endarterectomy by Dr. Trula Slade on 02/19/15 which was uncomplicated. Since I saw her 6 months ago she's been remaining clinically stable. She denies chest pain or shortness of breath. She continues to have claudication.Lower extremity Doppler studies performed 01/23/15 revealed ABIs in the 0.6 range bilaterally with a patent right SFA. She continues to complain of claudication however.recent Dopplers performed 12/22/15 revealed a right TBI 0. 17. She was admitted to the hospital with altered mental status and sepsis 04/06/17 for one week. 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 I suspect performed angiography of her 04/13/17 revealing an occluded right SFA was 0 vessel runoff. Atherectomy I's are entire right SFA and she subsequently underwent right second ray resection by Dr. Sharol Given to on 04/28/17.She has stopped smoking since that hospitalization. She also underwent a right transmetatarsal amputation by Dr.Dudawhich has subsequently healed. Recent lower extremity Dopplers performed 05/29/17 revealed a right ABI 0.65 with a patent right SFA and a left ABI 0.31. Since I saw her back in May of last year she was admitted last month for a week for volume overload and diastolic heart failure.  She does admit to dietary indiscretion.  She was diuresed 30 pounds.  She is aware of salt restriction.  She is on oral diuretics and weighs herself on a daily basis.  She denies claudication but does have pain  in her feet suggestive of diabetic peripheral neuropathy.    Current Meds    Medication Sig  . albuterol (PROVENTIL HFA;VENTOLIN HFA) 108 (90 Base) MCG/ACT inhaler Inhale 2 puffs into the lungs every 6 (six) hours as needed for wheezing or shortness of breath.  [name] amiodarone (PACERONE) 100 MG tablet TAKE 1 TABLET (100 MG TOTAL) BY MOUTH DAILY (Patient taking differently: Take 100 mg by mouth daily. )  . amLODipine (NORVASC) 10 MG tablet Take 1 tablet (10 mg total) by mouth daily.  [name] apixaban (ELIQUIS) 5 MG TABS tablet Take 1 tablet (5 mg total) by mouth 2 (two) times daily.  [name] atorvastatin (LIPITOR) 40 MG tablet TAKE 1 TABLET BY MOUTH ONCE EVERY EVENING (Patient taking differently: Take 40 mg by mouth daily. TAKE 1 TABLET BY MOUTH ONCE EVERY EVENING)  . bisoprolol (ZEBETA) 5 MG tablet Take 1 tablet (5 mg total) by mouth daily.  . cholecalciferol (VITAMIN D) 1000 units tablet Take 1,000 Units by mouth daily.   . clopidogrel (PLAVIX) 75 MG tablet Take 75 mg by mouth daily.  . diclofenac sodium (VOLTAREN) 1 % GEL APPLY 4 GRAMS TO THE AFFECTED AREAS TOPICALLY  4 TIMES PER DAY  . doxycycline (VIBRAMYCIN) 50 MG capsule Take 2 capsules (100 mg total) by mouth 2 (two) times daily. For 7 days only  . folic acid (FOLVITE) 1 MG tablet Take 1 tablet (1 mg total) by mouth daily. (Patient taking differently: Take 1 mg by mouth daily. )  . insulin aspart (NOVOLOG) 100 UNIT/ML injection Inject 30 Units into the skin 3 (three) times daily before meals. (Patient taking differently: Inject 30 Units into the skin 3 (three) times daily before meals. )  . Insulin Glargine (LANTUS SOLOSTAR) 100 UNIT/ML Solostar Pen Inject 72 Units into the skin at bedtime.  [name] levothyroxine (SYNTHROID, LEVOTHROID) 50 MCG tablet Take 1 tablet (50 mcg total) by mouth daily.  [name] losartan (COZAAR) 100 MG tablet Take 1 tablet (100 mg total) by mouth daily. (Patient taking differently: Take 100 mg by mouth at bedtime. )  . metolazone (ZAROXOLYN) 2.5 MG tablet Take 1 tablet (2.5 mg total) by mouth once a week. On Thursdays   . Multiple Vitamin (MULTIVITAMIN WITH MINERALS) TABS tablet Take 1 tablet by mouth daily.  . potassium chloride SA (K-DUR,KLOR-CON) 20 MEQ tablet Take 2 tablets (40 mEq total) by mouth daily.  [name] spironolactone (ALDACTONE) 25 MG tablet Take 1 tablet (25 mg total) by mouth daily.  [name] thiamine 100 MG tablet Take 1 tablet (100 mg total) by mouth daily. (Patient taking differently: Take 100 mg by mouth daily. )  . torsemide (DEMADEX) 20 MG tablet Take 40 mg by mouth 2 (two) times daily.   . traZODone (DESYREL) 100 MG tablet Take 100 mg by mouth at bedtime as needed for sleep.     Allergies  Allergen Reactions  . Gabapentin Nausea And Vomiting and Other (See Comments)    POSSIBLE SHAKING? TAKING MED CURRENTLY  . Lyrica [Pregabalin] Other (See Comments)    Shaking Pt still taking lyrica--"probably will stop taking"     Social History   Socioeconomic History  . Marital status: Divorced    Spouse name: Not on file  . Number of children: 1  . Years of education: 47  . Highest education level: 12th grade  Occupational History  . Occupation: disabled  Social Needs  . Financial resource strain: Somewhat hard  . Food insecurity:    Worry: Never true  Inability: Never true  . Transportation needs:    Medical: No    Non-medical: No  Tobacco Use  . Smoking status: Current Some Day Smoker    Packs/day: 1.00    Years: 44.00    Pack years: 44.00    Types: E-cigarettes, Cigarettes  . Smokeless tobacco: Former Systems developer    Types: Snuff  Substance and Sexual Activity  . Alcohol use: Yes    Comment: 11/26/2018 "couple drinks/month maybe"  . Drug use: Yes    Types: "Crack" cocaine, Marijuana, Oxycodone    Comment: 11/26/2018 "last hard drug use was ~ 09/08/2013; still smoke marijuana ~ once/month"  . Sexual activity: Not Currently  Lifestyle  . Physical activity:    Days per week: Not on file    Minutes per session: Not on file  . Stress: Not on file  Relationships  . Social connections:     Talks on phone: Not on file    Gets together: Not on file    Attends religious service: Not on file    Active member of club or organization: Not on file    Attends meetings of clubs or organizations: Not on file    Relationship status: Not on file  . Intimate partner violence:    Fear of current or ex partner: Not on file    Emotionally abused: Not on file    Physically abused: Not on file    Forced sexual activity: Not on file  Other Topics Concern  . Not on file  Social History Narrative   Lives in [place], in [place] with sister.  They are looking to move but don't have a place to go yet.       Review of Systems: General: negative for chills, fever, night sweats or weight changes.  Cardiovascular: negative for chest pain, dyspnea on exertion, edema, orthopnea, palpitations, paroxysmal nocturnal dyspnea or shortness of breath Dermatological: negative for rash Respiratory: negative for cough or wheezing Urologic: negative for hematuria Abdominal: negative for nausea, vomiting, diarrhea, bright red blood per rectum, melena, or hematemesis Neurologic: negative for visual changes, syncope, or dizziness All other systems reviewed and are otherwise negative except as noted above.    Blood pressure 126/64, pulse 78, height 5\' 4"  (1.626 m), weight 232 lb 12.8 oz (105.6 kg), last menstrual period 11/27/2012.  General appearance: alert and no distress Neck: no adenopathy, no carotid bruit, no JVD, supple, symmetrical, trachea midline and thyroid not enlarged, symmetric, no tenderness/mass/nodules Lungs: clear to auscultation bilaterally Heart: regular rate and rhythm, S1, S2 normal, no murmur, click, rub or gallop Extremities: extremities normal, atraumatic, no cyanosis or edema Pulses: Diminished pedal pulses bilaterally Skin: There was a reddish discoloration of both lower extremities Neurologic: Alert and oriented X 3, normal strength and tone. Normal symmetric reflexes. Normal  coordination and gait  EKG not performed today  ASSESSMENT AND PLAN:   Claudication [facility]) History of peripheral arterial disease with known occluded SFAs bilaterally status post right SFA intervention by myself 04/13/2017 with atherectomy and drug-coated balloon angioplasty of a long right SFA CTO with one-vessel runoff via the peroneal artery.  She has a known left SFA occlusion with one-vessel runoff via the anterior tibial.  Her most recent Dopplers suggests patency of her right SFA.  She really denies claudication but her most notable symptoms are of diabetic peripheral neuropathy.  She has had a right transmetatarsal amputation by Dr. Sharol Given in the past.  PAF (paroxysmal atrial fibrillation) ([place]) History of PAF maintaining sinus  rhythm on low-dose amiodarone and Eliquis oral anticoagulation.  Carotid arterial disease ([name]) And internal carotid artery with normal left ICA.  This will be repeated.History of carotid artery disease with Dopplers performed 04/06/2016  Tobacco abuse History of ongoing tobacco abuse of 1/2 pack/day recalcitrant risk factor modification.  Essential hypertension History of essential hypertension blood pressure measured today 126/64.  She is on amlodipine, Zebeta and losartan.  Continue current meds at current dosing.  Acute on chronic combined systolic and diastolic CHF (congestive heart failure) ([place]) Ms. Kreiser was recently admitted to [facility] 11/26/2018 for 1 week for volume overload and acute on chronic diastolic heart failure.  She does admit to dietary indiscretion.  Her 2D echo revealed EF of 45 to 50% with grade 2 diastolic dysfunction and moderate pulmonary hypertension.  She was diuresed 30 pounds.  She is on torsemide, metolazone and spironolactone.  She is aware of salt restriction and is on a "DASH diet".  She weighs herself daily.      [name] MD FACP,FACC,FAHA, [facility] 12/25/2018 9:15 AM

## 2018-12-25 NOTE — Assessment & Plan Note (Signed)
And internal carotid artery with normal left ICA.  This will be repeated.History of carotid artery disease with Dopplers performed 04/06/2016

## 2018-12-25 NOTE — Assessment & Plan Note (Signed)
[name] was recently admitted to [facility] 11/26/2018 for 1 week for volume overload and acute on chronic diastolic heart failure.  She does admit to dietary indiscretion.  Her 2D echo revealed EF of 45 to 50% with grade 2 diastolic dysfunction and moderate pulmonary hypertension.  She was diuresed 30 pounds.  She is on torsemide, metolazone and spironolactone.  She is aware of salt restriction and is on a "DASH diet".  She weighs herself daily.

## 2018-12-25 NOTE — Assessment & Plan Note (Signed)
History of peripheral arterial disease with known occluded SFAs bilaterally status post right SFA intervention by myself 04/13/2017 with atherectomy and drug-coated balloon angioplasty of a long right SFA CTO with one-vessel runoff via the peroneal artery.  She has a known left SFA occlusion with one-vessel runoff via the anterior tibial.  Her most recent Dopplers suggests patency of her right SFA.  She really denies claudication but her most notable symptoms are of diabetic peripheral neuropathy.  She has had a right transmetatarsal amputation by Dr. Sharol Given in the past.

## 2018-12-25 NOTE — Patient Instructions (Addendum)
Medication Instructions:  Your physician recommends that you continue on your current medications as directed. Please refer to the Current Medication list given to you today.  If you need a refill on your cardiac medications before your next appointment, please call your pharmacy.   Lab work: NONE If you have labs (blood work) drawn today and your tests are completely normal, you will receive your results only by: [name] MyChart Message (if you have MyChart) OR . A paper copy in the mail If you have any lab test that is abnormal or we need to change your treatment, we will call you to review the results.  Testing/Procedures: Your physician has requested that you have a carotid duplex. This test is an ultrasound of the carotid arteries in your neck. It looks at blood flow through these arteries that supply the brain with blood. Allow one hour for this exam. There are no restrictions or special instructions. SCHEDULE June 2020  Your physician has requested that you have a lower or upper extremity arterial duplex. This test is an ultrasound of the arteries in the legs or arms. It looks at arterial blood flow in the legs and arms. Allow one hour for Lower and Upper Arterial scans. There are no restrictions or special instructions SCHEDULE June 2020  Your physician has requested that you have an ankle brachial index (ABI). During this test an ultrasound and blood pressure cuff are used to evaluate the arteries that supply the arms and legs with blood. Allow thirty minutes for this exam. There are no restrictions or special instructions.  SCHEDULE June 2020   Follow-Up: At [facility], you and your health needs are our priority.  As part of our continuing mission to provide you with exceptional heart care, we have created designated Provider Care Teams.  These Care Teams include your primary Cardiologist (physician) and Advanced Practice Providers (APPs -  Physician Assistants and Nurse  Practitioners) who all work together to provide you with the care you need, when you need it. . You will need a follow up appointment in 3 months, 6 months and 9 months with an APP.  You will also need a follow up appointment in 12 months with Dr. Gwenlyn Found. Please call our office 2 months in advance to schedule these appointments.  You may see one of the following Advanced Practice Providers on your designated Care Team:   . Kerin Ransom, [place] . Almyra Deforest, PA-C . Fabian Sharp, PA-C . Jory Sims, DNP . Rosaria Ferries, PA-C . Roby Lofts, PA-C . Sande Rives, PA-C

## 2018-12-25 NOTE — Progress Notes (Signed)
Paramedicine Encounter    Patient ID: [name], female    DOB: 1961-09-05, 58 y.o.   MRN: [id number]   Patient Care Team: Tsosie Billing, MD as PCP - General (Internal Medicine) [name], MD as PCP - Cardiology (Cardiology) [name], MD as Consulting Physician (Cardiology) [name], MD as Consulting Physician (Pulmonary Disease) Jorge Ny, LCSW as Social Worker (Licensed Clinical Social Worker)  Patient Active Problem List   Diagnosis Date Noted  . Acute CHF (congestive heart failure) ([place]) 11/26/2018  . CHF (congestive heart failure) ([place]) 11/26/2018  . Acute respiratory failure ([place]) 10/21/2018  . AKI (acute kidney injury) ([place]) 08/18/2018  . Coagulation disorder ([place]) 08/09/2017  . Depression 07/21/2017  . At risk for adverse drug reaction 06/20/2017  . Peripheral neuropathy 06/20/2017  . Acute osteomyelitis of right foot ([place]) 06/13/2017  . S/P transmetatarsal amputation of foot, right ([place]) 06/05/2017  . 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 PAD (peripheral artery disease) ([place])   . SIRS (systemic inflammatory response syndrome) ([place]) 04/06/2017  . CKD (chronic kidney disease), stage III ([place]) 11/24/2016  . Suspected sleep apnea 11/24/2016  . Ulcer of toe of right foot, with necrosis of bone ([place]) 10/27/2016  . Ulcer of left lower leg ([place]) 05/19/2016  . Severe obesity (BMI >= 40) ([place]) 02/24/2016  . COPD GOLD 0  02/24/2016  . Chronic systolic CHF (congestive heart failure) ([place]) 02/12/2016  . Encounter for therapeutic drug monitoring 02/10/2016  . Symptomatic bradycardia 01/12/2016  . Essential hypertension 12/22/2015  . Acute on chronic combined systolic and diastolic CHF (congestive heart failure) ([place])   . Wheeze   . 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 Tobacco abuse 10/23/2015  . Coronary artery disease   . DOE (dyspnea on exertion) 04/29/2015   . Diabetes mellitus type 2, uncontrolled ([place]) 02/08/2015  . Influenza A 02/07/2015  . Wrist fracture, left, with routine healing, subsequent encounter 02/05/2015  . Wrist fracture, left, closed, initial encounter 01/29/2015  . PAF (paroxysmal atrial fibrillation) ([place]) 01/16/2015  . Carotid arterial disease ([place]) 01/16/2015  . Claudication ([place]) 01/15/2015  . Demand ischemia ([place]) 10/29/2014  . Insomnia 02/03/2014  . S/P peripheral artery angioplasty - TurboHawk atherectomy; R SFA 09/11/2013    Class: Acute  . Leg pain, bilateral 08/19/2013  . Hypothyroidism 07/31/2013  . Cellulitis 06/13/2013  . History of cocaine abuse ([place]) 06/13/2013  . Long term current use of anticoagulant therapy 05/20/2013  . Alcohol abuse   . Narcotic abuse ([place])   . Marijuana abuse   . Alcoholic cirrhosis ([place])   . DM (diabetes mellitus), type 2 with peripheral vascular complications ([name])     Current Outpatient Medications:  .  albuterol (PROVENTIL HFA;VENTOLIN HFA) 108 (90 Base) MCG/ACT inhaler, Inhale 2 puffs into the lungs every 6 (six) hours as needed for wheezing or shortness of breath., Disp: 18 g, Rfl: 0 .  amiodarone (PACERONE) 100 MG tablet, TAKE 1 TABLET (100 MG TOTAL) BY MOUTH DAILY (Patient taking differently: Take 100 mg by mouth daily. ), Disp: 30 tablet, Rfl: 5 .  amLODipine (NORVASC) 10 MG tablet, Take 1 tablet (10 mg total) by mouth daily., Disp: 30 tablet, Rfl: 5 .  apixaban (ELIQUIS) 5 MG TABS tablet, Take 1 tablet (5 mg total) by mouth 2 (two) times daily., Disp: 60 tablet, Rfl: 0 .  atorvastatin (LIPITOR) 40 MG tablet, TAKE 1 TABLET BY MOUTH ONCE EVERY  EVENING (Patient taking differently: Take 40 mg by mouth daily. TAKE 1 TABLET BY MOUTH ONCE EVERY EVENING), Disp: 30 tablet, Rfl: 0 .  bisoprolol (ZEBETA) 5 MG tablet, Take 1 tablet (5 mg total) by mouth daily., Disp: 30 tablet, Rfl: 5 .  cholecalciferol (VITAMIN D) 1000 units tablet, Take 1,000 Units by mouth daily. , Disp: , Rfl:  .   clopidogrel (PLAVIX) 75 MG tablet, Take 75 mg by mouth daily., Disp: , Rfl:  .  diclofenac sodium (VOLTAREN) 1 % GEL, APPLY 4 GRAMS TO THE AFFECTED AREAS TOPICALLY  4 TIMES PER DAY, Disp: , Rfl:  .  doxycycline (VIBRAMYCIN) 50 MG capsule, Take 2 capsules (100 mg total) by mouth 2 (two) times daily. For 7 days only, Disp: 14 capsule, Rfl: 0 .  folic acid (FOLVITE) 1 MG tablet, Take 1 tablet (1 mg total) by mouth daily. (Patient taking differently: Take 1 mg by mouth daily. ), Disp: 30 tablet, Rfl: 0 .  insulin aspart (NOVOLOG) 100 UNIT/ML injection, Inject 30 Units into the skin 3 (three) times daily before meals. (Patient taking differently: Inject 30 Units into the skin 3 (three) times daily before meals. ), Disp: 30 mL, Rfl: 0 .  Insulin Glargine (LANTUS SOLOSTAR) 100 UNIT/ML Solostar Pen, Inject 72 Units into the skin at bedtime., Disp: , Rfl:  .  levothyroxine (SYNTHROID, LEVOTHROID) 50 MCG tablet, Take 1 tablet (50 mcg total) by mouth daily., Disp: 30 tablet, Rfl: 0 .  losartan (COZAAR) 100 MG tablet, Take 1 tablet (100 mg total) by mouth daily. (Patient taking differently: Take 100 mg by mouth at bedtime. ), Disp: 30 tablet, Rfl: 5 .  metolazone (ZAROXOLYN) 2.5 MG tablet, Take 1 tablet (2.5 mg total) by mouth once a week. On Thursdays, Disp: 5 tablet, Rfl: 5 .  Multiple Vitamin (MULTIVITAMIN WITH MINERALS) TABS tablet, Take 1 tablet by mouth daily., Disp: 30 tablet, Rfl: 0 .  potassium chloride SA (K-DUR,KLOR-CON) 20 MEQ tablet, Take 2 tablets (40 mEq total) by mouth daily., Disp: 60 tablet, Rfl: 5 .  spironolactone (ALDACTONE) 25 MG tablet, Take 1 tablet (25 mg total) by mouth daily., Disp: 30 tablet, Rfl: 5 .  thiamine 100 MG tablet, Take 1 tablet (100 mg total) by mouth daily. (Patient taking differently: Take 100 mg by mouth daily. ), Disp: 30 tablet, Rfl: 0 .  torsemide (DEMADEX) 20 MG tablet, Take 2 tablets (40 mg total) by mouth 2 (two) times daily., Disp: 120 tablet, Rfl: 6 .  traZODone  (DESYREL) 100 MG tablet, Take 100 mg by mouth at bedtime as needed for sleep., Disp: , Rfl:  Allergies  Allergen Reactions  . Gabapentin Nausea And Vomiting and Other (See Comments)    POSSIBLE SHAKING? TAKING MED CURRENTLY  . Lyrica [Pregabalin] Other (See Comments)    Shaking Pt still taking lyrica--"probably will stop taking"       Social History   Socioeconomic History  . Marital status: Divorced    Spouse name: Not on file  . Number of children: 1  . Years of education: 29  . Highest education level: 12th grade  Occupational History  . Occupation: disabled  Social Needs  . Financial resource strain: Somewhat hard  . Food insecurity:    Worry: Never true    Inability: Never true  . Transportation needs:    Medical: No    Non-medical: No  Tobacco Use  . Smoking status: Current Some Day Smoker    Packs/day: 1.00    Years:  44.00    Pack years: 44.00    Types: E-cigarettes, Cigarettes  . Smokeless tobacco: Former Systems developer    Types: Snuff  Substance and Sexual Activity  . Alcohol use: Yes    Comment: 11/26/2018 "couple drinks/month maybe"  . Drug use: Yes    Types: "Crack" cocaine, Marijuana, Oxycodone    Comment: 11/26/2018 "last hard drug use was ~ 09/08/2013; still smoke marijuana ~ once/month"  . Sexual activity: Not Currently  Lifestyle  . Physical activity:    Days per week: Not on file    Minutes per session: Not on file  . Stress: Not on file  Relationships  . Social connections:    Talks on phone: Not on file    Gets together: Not on file    Attends religious service: Not on file    Active member of club or organization: Not on file    Attends meetings of clubs or organizations: Not on file    Relationship status: Not on file  . Intimate partner violence:    Fear of current or ex partner: Not on file    Emotionally abused: Not on file    Physically abused: Not on file    Forced sexual activity: Not on file  Other Topics Concern  . Not on file  Social  History Narrative   Lives in [place], in [place] with sister.  They are looking to move but don't have a place to go yet.      Physical Exam      Future Appointments  Date Time Provider [place]  01/29/2019 11:30 AM [facility] PA/NP [facility] None  03/18/2019  9:15 AM Edrick Kins, DPM [facility]  04/25/2019  1:00 PM [facility] VASC 3 [facility]  04/25/2019  3:00 PM [facility]ASC 3 [facility]  04/25/2019  4:00 PM [facility] VASC 3 [facility]    LMP 11/27/2012   Came by today to see pt to verify her pill bubble packing.  They left out her pm dose of eliquis, I had to take it back to the pharmacy to get it corrected. Once it was done I took it back to pt. Will see her in 2 wks unless she calls before then needing anything. She reports have been around [phone]. She denies any issues or complaints at this time.   Marylouise Stacks, [place] [facility] Paramedic  12/25/18

## 2019-01-01 ENCOUNTER — Telehealth (HOSPITAL_COMMUNITY): Payer: Self-pay

## 2019-01-01 NOTE — Telephone Encounter (Addendum)
I called pt to f/u on her PCP appointment today. Pt reports he told her she could restart the "2 color capsule that had blue on it" --cant remember which one that was.  I will go by and look at her paperwork to verify that and I think she had the old bottles still so I will also take that by the pharmacy so it can be added to her bubble packing. I will have to confirm the dosage as well.   Marylouise Stacks, EMT Paramedic  01/01/19

## 2019-01-02 ENCOUNTER — Other Ambulatory Visit (HOSPITAL_COMMUNITY): Payer: Self-pay

## 2019-01-02 NOTE — Progress Notes (Signed)
Came out today to verify the medication that she said the doc said she could restart--it was cymbalta---I called her PCP and she is going to send message to provider and call me back and let me know if she needs to restart it or not.   Marylouise Stacks, EMT-Paramedic  01/02/19

## 2019-01-07 ENCOUNTER — Telehealth (HOSPITAL_COMMUNITY): Payer: Self-pay

## 2019-01-07 NOTE — Telephone Encounter (Signed)
I contacted her PCP again, I never received phone call from them last week regarding her cymbalta use.  Pt was under the impression she could resume her cymbalta,she remembers him saying it could help with her leg pain.   I spoke to someone at PCP office this morning,nurse there reports it was d/c due to potential interactions with her eliquis.   I will let pt know this as she stated she was going to try and restart it again last week.   Marylouise Stacks, EMT-Paramedic  01/07/19

## 2019-01-16 ENCOUNTER — Other Ambulatory Visit (HOSPITAL_COMMUNITY): Payer: Self-pay

## 2019-01-16 NOTE — Progress Notes (Signed)
Paramedicine Encounter    Patient ID: [name], female    DOB: [birth year], 58 y.o.   MRN: [id number]   Patient Care Team: Tsosie Billing, MD as PCP - General (Internal Medicine) [name], MD as PCP - Cardiology (Cardiology) [name], MD as Consulting Physician (Cardiology) [name], MD as Consulting Physician (Pulmonary Disease) Jorge Ny, LCSW as Social Worker (Licensed Clinical Social Worker)  Patient Active Problem List   Diagnosis Date Noted  . Acute CHF (congestive heart failure) ([place]) 11/26/2018  . CHF (congestive heart failure) ([place]) 11/26/2018  . Acute respiratory failure ([place]) 10/21/2018  . AKI (acute kidney injury) ([place]) 08/18/2018  . Coagulation disorder ([place]) 08/09/2017  . Depression 07/21/2017  . At risk for adverse drug reaction 06/20/2017  . Peripheral neuropathy 06/20/2017  . Acute osteomyelitis of right foot ([place]) 06/13/2017  . S/P transmetatarsal amputation of foot, right ([place]) 06/05/2017  . 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 PAD (peripheral artery disease) ([place])   . SIRS (systemic inflammatory response syndrome) ([place]) 04/06/2017  . CKD (chronic kidney disease), stage III ([place]) 11/24/2016  . Suspected sleep apnea 11/24/2016  . Ulcer of toe of right foot, with necrosis of bone ([place]) 10/27/2016  . Ulcer of left lower leg ([place]) 05/19/2016  . Severe obesity (BMI >= 40) ([place]) 02/24/2016  . COPD GOLD 0  02/24/2016  . Chronic systolic CHF (congestive heart failure) ([place]) 02/12/2016  . Encounter for therapeutic drug monitoring 02/10/2016  . Symptomatic bradycardia 01/12/2016  . Essential hypertension 12/22/2015  . Acute on chronic combined systolic and diastolic CHF (congestive heart failure) ([place])   . Wheeze   . 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 Tobacco abuse 10/23/2015  . Coronary artery disease   . DOE (dyspnea on exertion) 04/29/2015   . Diabetes mellitus type 2, uncontrolled ([place]) 02/08/2015  . Influenza A 02/07/2015  . Wrist fracture, left, with routine healing, subsequent encounter 02/05/2015  . Wrist fracture, left, closed, initial encounter 01/29/2015  . PAF (paroxysmal atrial fibrillation) ([place]) 01/16/2015  . Carotid arterial disease ([place]) 01/16/2015  . Claudication ([place]) 01/15/2015  . Demand ischemia ([place]) 10/29/2014  . Insomnia 02/03/2014  . S/P peripheral artery angioplasty - TurboHawk atherectomy; R SFA 09/11/2013    Class: Acute  . Leg pain, bilateral 08/19/2013  . Hypothyroidism 07/31/2013  . Cellulitis 06/13/2013  . History of cocaine abuse ([place]) 06/13/2013  . Long term current use of anticoagulant therapy 05/20/2013  . Alcohol abuse   . Narcotic abuse ([name])   . Marijuana abuse   . Alcoholic cirrhosis ([place])   . DM (diabetes mellitus), type 2 with peripheral vascular complications ([name])     Current Outpatient Medications:  .  amiodarone (PACERONE) 100 MG tablet, TAKE 1 TABLET (100 MG TOTAL) BY MOUTH DAILY (Patient taking differently: Take 100 mg by mouth daily. ), Disp: 30 tablet, Rfl: 5 .  amLODipine (NORVASC) 10 MG tablet, Take 1 tablet (10 mg total) by mouth daily., Disp: 30 tablet, Rfl: 5 .  apixaban (ELIQUIS) 5 MG TABS tablet, Take 1 tablet (5 mg total) by mouth 2 (two) times daily., Disp: 60 tablet, Rfl: 0 .  atorvastatin (LIPITOR) 40 MG tablet, TAKE 1 TABLET BY MOUTH ONCE EVERY EVENING (Patient taking differently: Take 40 mg by mouth daily. TAKE 1 TABLET BY MOUTH ONCE EVERY EVENING), Disp: 30 tablet, Rfl: 0 .  bisoprolol (ZEBETA) 5 MG tablet, Take 1 tablet (5  mg total) by mouth daily., Disp: 30 tablet, Rfl: 5 .  cholecalciferol (VITAMIN D) 1000 units tablet, Take 1,000 Units by mouth daily. , Disp: , Rfl:  .  clopidogrel (PLAVIX) 75 MG tablet, Take 75 mg by mouth daily., Disp: , Rfl:  .  diclofenac sodium (VOLTAREN) 1 % GEL, APPLY 4 GRAMS TO THE AFFECTED AREAS TOPICALLY  4 TIMES PER DAY, Disp: ,  Rfl:  .  folic acid (FOLVITE) 1 MG tablet, Take 1 tablet (1 mg total) by mouth daily. (Patient taking differently: Take 1 mg by mouth daily. ), Disp: 30 tablet, Rfl: 0 .  insulin aspart (NOVOLOG) 100 UNIT/ML injection, Inject 30 Units into the skin 3 (three) times daily before meals. (Patient taking differently: Inject 30 Units into the skin 3 (three) times daily before meals. ), Disp: 30 mL, Rfl: 0 .  Insulin Glargine (LANTUS SOLOSTAR) 100 UNIT/ML Solostar Pen, Inject 72 Units into the skin at bedtime., Disp: , Rfl:  .  levothyroxine (SYNTHROID, LEVOTHROID) 50 MCG tablet, Take 1 tablet (50 mcg total) by mouth daily., Disp: 30 tablet, Rfl: 0 .  losartan (COZAAR) 100 MG tablet, Take 1 tablet (100 mg total) by mouth daily. (Patient taking differently: Take 100 mg by mouth at bedtime. ), Disp: 30 tablet, Rfl: 5 .  metolazone (ZAROXOLYN) 2.5 MG tablet, Take 1 tablet (2.5 mg total) by mouth once a week. On Thursdays, Disp: 5 tablet, Rfl: 5 .  Multiple Vitamin (MULTIVITAMIN WITH MINERALS) TABS tablet, Take 1 tablet by mouth daily., Disp: 30 tablet, Rfl: 0 .  potassium chloride SA (K-DUR,KLOR-CON) 20 MEQ tablet, Take 2 tablets (40 mEq total) by mouth daily., Disp: 60 tablet, Rfl: 5 .  spironolactone (ALDACTONE) 25 MG tablet, Take 1 tablet (25 mg total) by mouth daily., Disp: 30 tablet, Rfl: 5 .  thiamine 100 MG tablet, Take 1 tablet (100 mg total) by mouth daily. (Patient taking differently: Take 100 mg by mouth daily. ), Disp: 30 tablet, Rfl: 0 .  torsemide (DEMADEX) 20 MG tablet, Take 2 tablets (40 mg total) by mouth 2 (two) times daily., Disp: 120 tablet, Rfl: 6 .  traZODone (DESYREL) 100 MG tablet, Take 100 mg by mouth at bedtime as needed for sleep., Disp: , Rfl:  .  albuterol (PROVENTIL HFA;VENTOLIN HFA) 108 (90 Base) MCG/ACT inhaler, Inhale 2 puffs into the lungs every 6 (six) hours as needed for wheezing or shortness of breath., Disp: 18 g, Rfl: 0 .  doxycycline (VIBRAMYCIN) 50 MG capsule, Take 2  capsules (100 mg total) by mouth 2 (two) times daily. For 7 days only (Patient not taking: Reported on 01/16/2019), Disp: 14 capsule, Rfl: 0 Allergies  Allergen Reactions  . Gabapentin Nausea And Vomiting and Other (See Comments)    POSSIBLE SHAKING? TAKING MED CURRENTLY  . Lyrica [Pregabalin] Other (See Comments)    Shaking Pt still taking lyrica--"probably will stop taking"       Social History   Socioeconomic History  . Marital status: Divorced    Spouse name: Not on file  . Number of children: 1  . Years of education: 35  . Highest education level: 12th grade  Occupational History  . Occupation: disabled  Social Needs  . Financial resource strain: Somewhat hard  . Food insecurity:    Worry: Never true    Inability: Never true  . Transportation needs:    Medical: No    Non-medical: No  Tobacco Use  . Smoking status: Current Some Day Smoker  Packs/day: 1.00    Years: 44.00    Pack years: 44.00    Types: E-cigarettes, Cigarettes  . Smokeless tobacco: Former Systems developer    Types: Snuff  Substance and Sexual Activity  . Alcohol use: Yes    Comment: 11/26/2018 "couple drinks/month maybe"  . Drug use: Yes    Types: "Crack" cocaine, Marijuana, Oxycodone    Comment: 11/26/2018 "last hard drug use was ~ 09/08/2013; still smoke marijuana ~ once/month"  . Sexual activity: Not Currently  Lifestyle  . Physical activity:    Days per week: Not on file    Minutes per session: Not on file  . Stress: Not on file  Relationships  . Social connections:    Talks on phone: Not on file    Gets together: Not on file    Attends religious service: Not on file    Active member of club or organization: Not on file    Attends meetings of clubs or organizations: Not on file    Relationship status: Not on file  . Intimate partner violence:    Fear of current or ex partner: Not on file    Emotionally abused: Not on file    Physically abused: Not on file    Forced sexual activity: Not on file   Other Topics Concern  . Not on file  Social History Narrative   Lives in [place], in [place] with sister.  They are looking to move but don't have a place to go yet.      Physical Exam      Future Appointments  Date Time Provider [place]  01/29/2019 11:30 AM [facility] PA/NP [facility] None  03/18/2019  9:15 AM Edrick Kins, DPM [facility]  04/25/2019  1:00 PM [facility] NL VASC 3 [facility]  04/25/2019  3:00 PM [facility] VASC 3 [facility]  04/25/2019  4:00 PM [facility] NL VASC 3 [facility]    BP 126/64   Pulse 82   Resp 15   LMP 11/27/2012   SpO2 96%   Weight yesterday-232 Weight last visit-237 @ clinic  CBG PTA-401  Pt moved to another house, pt states her insurance is no longer will be covering her short acting insulin novolog, she still has her long acting insulin and will take that tonight. Her pharmacy is in process of reaching her PCP about it. --see below She states she feels pretty good.she has the bubble packs delivered to her---in one week he placed the torsemide in the bedtime row instead of the afternoon   [facility] and she is going to get her a bedside commode and also some stockings to help her pain in her feet.  Pharmacy advised he was sending it to dr Mikeal Hawthorne sun and I advised him her PCP is now dr Marvel Plan. I will call her PCP and see if they can fax over Rx of similar insulin her insurance can cover.  I spoke to reception at PCP [facility] and they advised it was sent over earlier today. I spoke with pharmacist at [facility] and he advised he just got it but it does not have the specific instructions so he will call them to get that info. Pharmacist said he will be sure its delivered tomor. She has been out one day.  Pt denies any sob, no dizziness, no c/p. Just her feet hurting her. She thinks nurse will be coming out once more and then d/c from  nursing care.     Marylouise Stacks,  [facility] Paramedic  01/16/19

## 2019-01-29 ENCOUNTER — Other Ambulatory Visit: Payer: Self-pay | Admitting: *Deleted

## 2019-01-29 ENCOUNTER — Encounter (HOSPITAL_COMMUNITY): Payer: Medicare Other

## 2019-01-29 ENCOUNTER — Telehealth (HOSPITAL_COMMUNITY): Payer: Self-pay

## 2019-01-29 ENCOUNTER — Telehealth: Payer: Medicare Other | Admitting: Family

## 2019-01-29 DIAGNOSIS — J218 Acute bronchiolitis due to other specified organisms: Secondary | ICD-10-CM

## 2019-01-29 DIAGNOSIS — R6889 Other general symptoms and signs: Secondary | ICD-10-CM

## 2019-01-29 DIAGNOSIS — B9789 Other viral agents as the cause of diseases classified elsewhere: Secondary | ICD-10-CM | POA: Diagnosis not present

## 2019-01-29 MED ORDER — PREDNISONE 10 MG (21) PO TBPK: ORAL_TABLET | ORAL | 0 refills | Status: DC

## 2019-01-29 NOTE — Progress Notes (Signed)
We are sorry that you are not feeling well.  Here is how we plan to help!  Based on your presentation I believe you most likely have A cough due to a virus.  This is called viral bronchitis and is best treated by rest, plenty of fluids and control of the cough.  You may use Ibuprofen or Tylenol as directed to help your symptoms.     In addition you may use A non-prescription cough medication called Mucinex DM: take 2 tablets every 12 hours.  Prednisone 10 mg daily for 6 days (see taper instructions below)  From your responses in the eVisit questionnaire you describe inflammation in the upper respiratory tract which is causing a significant cough.  This is commonly called Bronchitis and has four common causes:    Allergies  Viral Infections  Acid Reflux  Bacterial Infection Allergies, viruses and acid reflux are treated by controlling symptoms or eliminating the cause. An example might be a cough caused by taking certain blood pressure medications. You stop the cough by changing the medication. Another example might be a cough caused by acid reflux. Controlling the reflux helps control the cough.  USE OF BRONCHODILATOR ("RESCUE") INHALERS: There is a risk from using your bronchodilator too frequently.  The risk is that over-reliance on a medication which only relaxes the muscles surrounding the breathing tubes can reduce the effectiveness of medications prescribed to reduce swelling and congestion of the tubes themselves.  Although you feel brief relief from the bronchodilator inhaler, your asthma may actually be worsening with the tubes becoming more swollen and filled with mucus.  This can delay other crucial treatments, such as oral steroid medications. If you need to use a bronchodilator inhaler daily, several times per day, you should discuss this with your provider.  There are probably better treatments that could be used to keep your asthma under control.     HOME CARE . Only take  medications as instructed by your medical team. . Complete the entire course of an antibiotic. . Drink plenty of fluids and get plenty of rest. . Avoid close contacts especially the very young and the elderly . Cover your mouth if you cough or cough into your sleeve. . Always remember to wash your hands . A steam or ultrasonic humidifier can help congestion.   GET HELP RIGHT AWAY IF: . You develop worsening fever. . You become short of breath . You cough up blood. . Your symptoms persist after you have completed your treatment plan MAKE SURE YOU   Understand these instructions.  Will watch your condition.  Will get help right away if you are not doing well or get worse.  Your e-visit answers were reviewed by a board certified advanced clinical practitioner to complete your personal care plan.  Depending on the condition, your plan could have included both over the counter or prescription medications. If there is a problem please reply  once you have received a response from your provider. Your safety is important to us.  If you have drug allergies check your prescription carefully.    You can use MyChart to ask questions about today's visit, request a non-urgent call back, or ask for a work or school excuse for 24 hours related to this e-Visit. If it has been greater than 24 hours you will need to follow up with your provider, or enter a new e-Visit to address those concerns. You will get an e-mail in the next two days asking about your   experience.  I hope that your e-visit has been valuable and will speed your recovery. Thank you for using e-visits.  

## 2019-01-29 NOTE — Telephone Encounter (Signed)
Called pt to f/u on her e-visit today and testing. She thought she had been around someone who had the corona virus that was from out of town a couple wks ago and then pt was around this person and then a week later began having a cough and not feeling well. This person tested negative for it. Pt has ordered her meds and will be delivered tomor.  Will f/u next week.   Marylouise Stacks, EMT-Paramedic 01/29/19

## 2019-01-30 ENCOUNTER — Telehealth (HOSPITAL_COMMUNITY): Payer: Self-pay

## 2019-01-30 ENCOUNTER — Other Ambulatory Visit (HOSPITAL_COMMUNITY): Payer: Self-pay

## 2019-01-30 MED ORDER — TORSEMIDE 20 MG PO TABS: 40.0000 mg | ORAL_TABLET | Freq: Two times a day (BID) | ORAL | 0 refills | Status: DC

## 2019-01-30 MED ORDER — METOLAZONE 2.5 MG PO TABS: 2.5000 mg | ORAL_TABLET | ORAL | 0 refills | Status: DC

## 2019-01-30 NOTE — Telephone Encounter (Addendum)
Pt called me upset and concerned, her weight is up to 245lbs for the past few days, she couldn't really telll me exactly how long it has been up. She denies any increased sob, she states her "usual smokers cough" . She feels swollen around her neck and her abdomen. She is home quarantined right now awaiting results on the COVID-19 test.  Last time I seen her she just got moved in to new house and hadnt weighed in a couple days. Last weight at clinic was 232.  She also states pharmacy brought her out bottle of pills that she wasn't sure what it was--pre-something.  I called pharmacy and he advised it was prednisone and the directions was in the bag for the tapering dosing. I called her back and explained this.  Spoke to [place] in clinic and he advised for her to increase her torsemide to 60mg  BID for 2 days and to take additional 2.5mg  of metolazone this week.  Nurse sent in to pharmacy and I will call to verify he understand the directions and to take the extra pills to her and she can take it in addition to her bubble packs.   Marylouise Stacks, EMT-Paramedic  01/30/19

## 2019-02-01 ENCOUNTER — Telehealth (HOSPITAL_COMMUNITY): Payer: Self-pay | Admitting: Licensed Clinical Social Worker

## 2019-02-01 NOTE — Telephone Encounter (Signed)
CSW reached out to pt to check in regarding food and medication status at this time.  Unable to reach directly- left voicemail.  CSW encouraged pt to reach out with any concerns and will continue to follow and assist as needed  Jorge Ny, [place] Worker [facility] [phone]

## 2019-02-07 ENCOUNTER — Telehealth (HOSPITAL_COMMUNITY): Payer: Self-pay | Admitting: Licensed Clinical Social Worker

## 2019-02-07 NOTE — Telephone Encounter (Signed)
Patient identified as a candidate to receive 14 heart healthy meals per week for 3 months through [facility] partnership with [employer] program.   Completed referral sent in for review.  Anticipate patient will receive first shipment of food in 1-3 business days.  Khaleb Broz H. Lyrah Bradt, LCSW Clinical Social Worker [facility] Desk#: [phone] Cell#: [phone]   

## 2019-02-11 ENCOUNTER — Telehealth (HOSPITAL_COMMUNITY): Payer: Self-pay | Admitting: Emergency Medicine

## 2019-02-11 NOTE — Telephone Encounter (Signed)
Patient contacted and made aware of all results, all questions answered.   

## 2019-02-13 LAB — NOVEL CORONAVIRUS, NAA: SARS-CoV-2, NAA: NOT DETECTED

## 2019-02-19 ENCOUNTER — Ambulatory Visit (HOSPITAL_COMMUNITY)
Admission: RE | Admit: 2019-02-19 | Discharge: 2019-02-19 | Disposition: A | Discharge status: Home / Self Care | Payer: Medicare Other | Source: Ambulatory Visit | Attending: Internal Medicine | Admitting: Internal Medicine

## 2019-02-19 ENCOUNTER — Other Ambulatory Visit: Payer: Self-pay

## 2019-02-19 ENCOUNTER — Encounter (HOSPITAL_COMMUNITY): Payer: Self-pay

## 2019-02-19 ENCOUNTER — Ambulatory Visit (HOSPITAL_COMMUNITY)
Admission: EM | Admit: 2019-02-19 | Discharge: 2019-02-19 | Disposition: A | Discharge status: Home / Self Care | Payer: Medicare Other | Attending: Family Medicine | Admitting: Family Medicine

## 2019-02-19 ENCOUNTER — Ambulatory Visit (INDEPENDENT_AMBULATORY_CARE_PROVIDER_SITE_OTHER): Payer: Medicare Other

## 2019-02-19 DIAGNOSIS — J209 Acute bronchitis, unspecified: Secondary | ICD-10-CM

## 2019-02-19 DIAGNOSIS — I10 Essential (primary) hypertension: Secondary | ICD-10-CM

## 2019-02-19 DIAGNOSIS — R05 Cough: Secondary | ICD-10-CM

## 2019-02-19 DIAGNOSIS — E119 Type 2 diabetes mellitus without complications: Secondary | ICD-10-CM | POA: Diagnosis not present

## 2019-02-19 DIAGNOSIS — R509 Fever, unspecified: Secondary | ICD-10-CM

## 2019-02-19 DIAGNOSIS — Z720 Tobacco use: Secondary | ICD-10-CM

## 2019-02-19 MED ORDER — PREDNISONE 20 MG PO TABS: 40.0000 mg | ORAL_TABLET | Freq: Every day | ORAL | 0 refills | Status: AC

## 2019-02-19 MED ORDER — BENZONATATE 100 MG PO CAPS: 100.0000 mg | ORAL_CAPSULE | Freq: Three times a day (TID) | ORAL | 0 refills | Status: DC | PRN

## 2019-02-19 MED ORDER — AMOXICILLIN-POT CLAVULANATE 875-125 MG PO TABS: 1.0000 | ORAL_TABLET | Freq: Two times a day (BID) | ORAL | 0 refills | Status: DC

## 2019-02-19 NOTE — ED Triage Notes (Signed)
Cough began 4 days ago, no other symptoms present denies fever

## 2019-02-19 NOTE — Discharge Instructions (Addendum)
If symptoms worsen, follow-up at the ER for further evaluation given history of acute respiratory failure and COPD.

## 2019-02-19 NOTE — ED Provider Notes (Signed)
[facility]    CSN: [id number] Arrival date & time: 02/19/19  1030     History   Chief Complaint Chief Complaint  Patient presents with   Cough    HPI [name] is a 58 y.o. female.   HPI   Patient with a medical history significant for current daily smoking, COPD, pneumonia, cardiovascular disease, presents with a complaint of shortness of breath, wheezing, chest tightness, and cough (productive white colored). She has taken Robitussin for cough without improvement. She hasn't attempted relief with albuterol. Endorses wheezing, which is worse at night. She is not oxygen dependent.  Past Medical History:  Diagnosis Date   Alcohol abuse    Alcoholic cirrhosis ([place])    Anemia    Anxiety    Arthritis    "knees" (11/26/2018)   B12 deficiency    CAD (coronary artery disease)    a. cath 11/10/2014 LM nl, LAD min irregs, D1 30 ost, D2 50d, LCX 49m, OM1 80 p/m (1.5 mm vessel), OM2 59m, RCA nondom 38m-->med rx.. Demand ischemia in the setting of rapid a-fib.   Cardiomyopathy ([place])    Carotid artery disease ([place])    a. 01/2015 Carotid Angio: RICA 081, LICA 44Y. s/p L carotid endarterectomy 02/2015.   Cellulitis    lower extremities   Chronic combined systolic and diastolic CHF (congestive heart failure) ([place])    a. Last echo 12/205: EF 40-45%, inferoapical/posterior HK, not technically sufficient to allow eval of LV diastolic function, normal LA in size..   Cocaine abuse ([name])    COPD (chronic obstructive pulmonary disease) ([name])    Critical lower limb ischemia    Depression    Diabetic peripheral neuropathy ([name])    Elevated troponin    a. Chronic elevation.   GERD (gastroesophageal reflux disease)    Hyperlipemia    Hypertension    Hypokalemia    Hypomagnesemia    Hypothyroidism    Marijuana abuse    Narcotic abuse ([place])    Noncompliance    NSVT (nonsustained ventricular tachycardia) ([name])    Obesity    PAF (paroxysmal atrial  fibrillation) ([name])    Paroxysmal atrial tachycardia ([name])    Peripheral arterial disease ([place])    a. 01/2015 Angio/PTA: LSFA 100 w/ recon @ adductor canal and 1 vessel runoff via AT, RSFA 99 (atherectomy/pta) - 1 vessel runoff via diff dzs peroneal.   Pneumonia    "once or twice" (11/26/2018)   Poorly controlled type 2 diabetes mellitus ([place])    Renal insufficiency    a. 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   Sleep apnea    "couldn't handle wearing the mask" (11/26/2018)   Symptomatic bradycardia    avoid AV blocking agent per EP   Tobacco abuse     Patient Active Problem List   Diagnosis Date Noted   Acute CHF (congestive heart failure) ([place]) 11/26/2018   CHF (congestive heart failure) ([place]) 11/26/2018   Acute respiratory failure ([place]) 10/21/2018   AKI (acute kidney injury) ([place]) 08/18/2018   Coagulation disorder ([place]) 08/09/2017   Depression 07/21/2017   At risk for adverse drug reaction 06/20/2017   Peripheral neuropathy 06/20/2017   Acute osteomyelitis of right foot ([place]) 06/13/2017   S/P transmetatarsal amputation of foot, right ([place]) 06/05/2017   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 ([place]) 05/19/2017   PAD (peripheral artery disease) ([name])    SIRS (systemic inflammatory response syndrome) ([place]) 04/06/2017   CKD (chronic kidney disease), stage III (  [place]) 11/24/2016   Suspected sleep apnea 11/24/2016   Ulcer of toe of right foot, with necrosis of bone ([place]) 10/27/2016   Ulcer of left lower leg ([place]) 05/19/2016   Severe obesity (BMI >= 40) ([place]) 02/24/2016   COPD GOLD 0  61/95/0932   Chronic systolic CHF (congestive heart failure) ([place]) 02/12/2016   Encounter for therapeutic drug monitoring 02/10/2016   Symptomatic bradycardia 01/12/2016   Essential hypertension 12/22/2015   Acute on chronic combined systolic and diastolic CHF (congestive heart failure) ([name])    Wheeze    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  11/08/2015   Tobacco abuse 10/23/2015   Coronary artery disease    DOE (dyspnea on exertion) 04/29/2015   Diabetes mellitus type 2, uncontrolled ([place]) 02/08/2015   Influenza A 02/07/2015   Wrist fracture, left, with routine healing, subsequent encounter 02/05/2015   Wrist fracture, left, closed, initial encounter 01/29/2015   PAF (paroxysmal atrial fibrillation) ([place]) 01/16/2015   Carotid arterial disease ([place]) 01/16/2015   Claudication ([place]) 01/15/2015   Demand ischemia ([place]) 10/29/2014   Insomnia 02/03/2014   S/P peripheral artery angioplasty - [place]bo[place] atherectomy; R SFA 09/11/2013    Class: Acute   Leg pain, bilateral 08/19/2013   Hypothyroidism 07/31/2013   Cellulitis 06/13/2013   History of cocaine abuse ([place]) 06/13/2013   Long term current use of anticoagulant therapy 05/20/2013   Alcohol abuse    Narcotic abuse ([place])    Marijuana abuse    Alcoholic cirrhosis ([place])    DM (diabetes mellitus), type 2 with peripheral vascular complications ([place])      OB History    Gravida  1   Para  1   Term  1   Preterm      AB      Living  1     SAB      TAB      Ectopic      Multiple      Live Births               Home Medications    Prior to Admission medications   Medication Sig Start Date End Date Taking? Authorizing Provider  albuterol (PROVENTIL HFA;VENTOLIN HFA) 108 (90 Base) MCG/ACT inhaler Inhale 2 puffs into the lungs every 6 (six) hours as needed for wheezing or shortness of breath. 08/09/17   Medina-Vargas, Monina C, NP  amiodarone (PACERONE) 100 MG tablet TAKE 1 TABLET (100 MG TOTAL) BY MOUTH DAILY Patient taking differently: Take 100 mg by mouth daily.  09/20/18   Larey Dresser, MD  amLODipine (NORVASC) 10 MG tablet Take 1 tablet (10 mg total) by mouth daily. 09/26/18   Larey Dresser, MD  apixaban (ELIQUIS) 5 MG TABS tablet Take 1 tablet (5 mg total) by mouth 2 (two) times daily. 08/09/17   Medina-Vargas, Monina C, NP    atorvastatin (LIPITOR) 40 MG tablet TAKE 1 TABLET BY MOUTH ONCE EVERY EVENING Patient taking differently: Take 40 mg by mouth daily. TAKE 1 TABLET BY MOUTH ONCE EVERY EVENING 08/09/17   Medina-Vargas, Monina C, NP  bisoprolol (ZEBETA) 5 MG tablet Take 1 tablet (5 mg total) by mouth daily. 12/04/18   Georgiana Shore, NP  cholecalciferol (VITAMIN D) 1000 units tablet Take 1,000 Units by mouth daily.     [provider]  clopidogrel (PLAVIX) 75 MG tablet Take 75 mg by mouth daily.    [provider]  diclofenac sodium (VOLTAREN) 1 % GEL APPLY 4 GRAMS TO THE AFFECTED AREAS  TOPICALLY  4 TIMES PER DAY 12/08/18   [provider]  doxycycline (VIBRAMYCIN) 50 MG capsule Take 2 capsules (100 mg total) by mouth 2 (two) times daily. For 7 days only Patient not taking: Reported on 01/16/2019 12/18/18   Darrick Grinder D, NP  folic acid (FOLVITE) 1 MG tablet Take 1 tablet (1 mg total) by mouth daily. Patient taking differently: Take 1 mg by mouth daily.  10/25/18   Kayleen Memos, DO  insulin aspart (NOVOLOG) 100 UNIT/ML injection Inject 30 Units into the skin 3 (three) times daily before meals. Patient taking differently: Inject 30 Units into the skin 3 (three) times daily before meals.  08/09/17   Medina-Vargas, Monina C, NP  Insulin Glargine (LANTUS SOLOSTAR) 100 UNIT/ML Solostar Pen Inject 75 Units into the skin at bedtime.     [provider]  levothyroxine (SYNTHROID, LEVOTHROID) 50 MCG tablet Take 1 tablet (50 mcg total) by mouth daily. 08/09/17   Medina-Vargas, Monina C, NP  losartan (COZAAR) 100 MG tablet Take 1 tablet (100 mg total) by mouth daily. Patient taking differently: Take 100 mg by mouth at bedtime.  10/18/18   Larey Dresser, MD  metolazone (ZAROXOLYN) 2.5 MG tablet Take 1 tablet (2.5 mg total) by mouth once a week. On Thursdays 01/30/19   Shirley Friar, PA-C  Multiple Vitamin (MULTIVITAMIN WITH MINERALS) TABS tablet Take 1 tablet by mouth daily. 10/25/18    Kayleen Memos, DO  potassium chloride SA (K-DUR,KLOR-CON) 20 MEQ tablet Take 2 tablets (40 mEq total) by mouth daily. 12/04/18   Georgiana Shore, NP  predniSONE (STERAPRED UNI-PAK 21 TAB) 10 MG (21) TBPK tablet As directed 01/29/19   Dutch Quint B, FNP  spironolactone (ALDACTONE) 25 MG tablet Take 1 tablet (25 mg total) by mouth daily. 12/04/18   Georgiana Shore, NP  thiamine 100 MG tablet Take 1 tablet (100 mg total) by mouth daily. Patient taking differently: Take 100 mg by mouth daily.  10/25/18   Kayleen Memos, DO  torsemide (DEMADEX) 20 MG tablet Take 2 tablets (40 mg total) by mouth 2 (two) times daily. 01/30/19   Shirley Friar, PA-C  traZODone (DESYREL) 100 MG tablet Take 100 mg by mouth at bedtime as needed for sleep.    [provider]    Family History Family History  Problem Relation Age of Onset   Hypertension Mother    Diabetes Mother    Cancer Mother        breast, ovarian, colon   Clotting disorder Mother    Heart disease Mother    Heart attack Mother    Breast cancer Mother        in 58's   Hypertension Father    Heart disease Father    Emphysema Sister        smoked    Social History Social History   Tobacco Use   Smoking status: Current Some Day Smoker    Packs/day: 1.00    Years: 44.00    Pack years: 44.00    Types: E-cigarettes, Cigarettes   Smokeless tobacco: Former Systems developer    Types: Snuff  Substance Use Topics   Alcohol use: Yes    Comment: 11/26/2018 "couple drinks/month maybe"   Drug use: Yes    Types: "Crack" cocaine, Marijuana, Oxycodone    Comment: 11/26/2018 "last hard drug use was ~ 09/08/2013; still smoke marijuana ~ once/month"     Allergies   Gabapentin and Lyrica [pregabalin]   Review  of Systems Review of Systems Pertinent negatives listed in HPI Physical Exam Triage Vital Signs ED Triage Vitals  Enc Vitals Group     BP 02/19/19 1038 (!) 127/59     Pulse Rate 02/19/19 1038 76     Resp 02/19/19  1038 20     Temp 02/19/19 1038 98 F (36.7 C)     Temp src --      SpO2 02/19/19 1038 100 %     Weight --      Height --      Head Circumference --      Peak Flow --      Pain Score 02/19/19 1040 9     Pain Loc --      Pain Edu? --      Excl. in [place]? --    No data found.  Updated Vital Signs BP (!) 127/59    Pulse 76    Temp 98 F (36.7 C)    Resp 20    LMP 11/27/2012    SpO2 100%   Visual Acuity Right Eye Distance:   Left Eye Distance:   Bilateral Distance:    Right Eye Near:   Left Eye Near:    Bilateral Near:     Physical Exam Constitutional:      Appearance: Normal appearance. She is not ill-appearing.  HENT:     Head: Normocephalic.  Cardiovascular:     Rate and Rhythm: Normal rate and regular rhythm.     Pulses: Normal pulses.  Pulmonary:     Effort: No tachypnea or accessory muscle usage.     Breath sounds: Normal air entry. No decreased air movement. Wheezing and rhonchi present.  Neurological:     General: No focal deficit present.     Mental Status: She is alert.  Psychiatric:        Mood and Affect: Mood normal.    UC Treatments / Results  Labs (all labs ordered are listed, but only abnormal results are displayed) Labs Reviewed - No data to display  EKG None  Radiology Dg Chest 2 View Result Date: 02/19/2019 CLINICAL DATA:  Smoker, cough and fever, hypertension and diabetes EXAM: CHEST - 2 VIEW COMPARISON:  11/27/2018 FINDINGS: Borderline heart size without edema, significant airspace process, pneumonia, collapse or consolidation. No effusion or pneumothorax. Trachea midline. Aorta atherosclerotic. IMPRESSION: Stable borderline cardiomegaly without acute process Thoracic atherosclerosis Electronically Signed   By: Jerilynn Mages.  Shick M.D.   On: 02/19/2019 11:43    Procedures Procedures (including critical care time)  Medications Ordered in UC Medications - No data to display  Initial Impression / Assessment and Plan / UC Course  I have reviewed the  triage vital signs and the nursing notes.  Pertinent labs & imaging results that were available during my care of the patient were reviewed by me and considered in my medical decision making (see chart for details).   Patient presents today with a complaint of shortness of breath, wheezing, and cough. History of multiple comorbid conditions including COPD. Chest-x-ray was negative for pneumonia. Will treat for acute bronchitis, secondary to COPD. Red Flags discussed with patient. She verbalized understanding and will follow-up with PCP or return to urgent care if symptoms do not improve. Final Clinical Impressions(s) / UC Diagnoses   Final diagnoses:  Acute bronchitis, unspecified organism     Discharge Instructions     If symptoms worsen, follow-up at the ER for further evaluation given history of acute respiratory failure and COPD.  ED Prescriptions    Medication Sig Dispense Auth. Provider   amoxicillin-clavulanate (AUGMENTIN) 875-125 MG tablet Take 1 tablet by mouth 2 (two) times daily. 20 tablet Scot Jun, FNP   predniSONE (DELTASONE) 20 MG tablet Take 2 tablets (40 mg total) by mouth daily with breakfast for 3 days. 6 tablet Scot Jun, FNP   benzonatate (TESSALON) 100 MG capsule Take 1-2 capsules (100-200 mg total) by mouth 3 (three) times daily as needed for cough. 40 capsule Scot Jun, FNP     Controlled Substance Prescriptions [place] Controlled Substance Registry consulted? Not Applicable   Scot Jun, FNP 02/21/19 [phone]

## 2019-02-25 ENCOUNTER — Telehealth (HOSPITAL_COMMUNITY): Payer: Self-pay | Admitting: Licensed Clinical Social Worker

## 2019-02-25 NOTE — Telephone Encounter (Signed)
CSW reached out to pt to check in regarding food and medication status at this time.  Pt states she has everything she needs at this time.  Is getting [employer] and states that is going well and is almost too much food at one time as her refrigerator space is limited.    Pt is trying to stay busy but is getting a little stir crazy though she has a roommate to keep her company- very hopeful for the quarantine to be over soon.  CSW encouraged pt to reach out with any concerns and will continue to follow and assist as needed  Jorge Ny, [place] Worker [facility] [phone]

## 2019-02-27 ENCOUNTER — Telehealth (HOSPITAL_COMMUNITY): Payer: Self-pay

## 2019-02-27 NOTE — Telephone Encounter (Signed)
Called pt for phone visit this week. She reports doing much better. Her CBG's have been running up and down.  She states the mom[employer] meals are working out great.  She has one pack of her meds left. I will see her for home visit next week.   Marylouise Stacks, EMT-Paramedic  02/27/19

## 2019-03-04 ENCOUNTER — Other Ambulatory Visit (HOSPITAL_COMMUNITY): Payer: Self-pay | Admitting: Cardiology

## 2019-03-04 DIAGNOSIS — I10 Essential (primary) hypertension: Secondary | ICD-10-CM

## 2019-03-07 ENCOUNTER — Other Ambulatory Visit (HOSPITAL_COMMUNITY): Payer: Self-pay

## 2019-03-07 NOTE — Progress Notes (Signed)
Paramedicine Encounter    Patient ID: [name], female    DOB: [birth year], 58 y.o.   MRN: [id number]   Patient Care Team: Tsosie Billing, MD as PCP - General (Internal Medicine) [name], MD as PCP - Cardiology (Cardiology) [name], MD as Consulting Physician (Cardiology) [name], MD as Consulting Physician (Pulmonary Disease)  Patient Active Problem List   Diagnosis Date Noted  . Acute CHF (congestive heart failure) ([place]) 11/26/2018  . CHF (congestive heart failure) ([place]) 11/26/2018  . Acute respiratory failure ([place]) 10/21/2018  . AKI (acute kidney injury) ([place]) 08/18/2018  . Coagulation disorder ([place]) 08/09/2017  . Depression 07/21/2017  . At risk for adverse drug reaction 06/20/2017  . Peripheral neuropathy 06/20/2017  . Acute osteomyelitis of right foot ([place]) 06/13/2017  . S/P transmetatarsal amputation of foot, right ([place]) 06/05/2017  . 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 PAD (peripheral artery disease) ([place])   . SIRS (systemic inflammatory response syndrome) ([place]) 04/06/2017  . CKD (chronic kidney disease), stage III ([place]) 11/24/2016  . Suspected sleep apnea 11/24/2016  . Ulcer of toe of right foot, with necrosis of bone ([place]) 10/27/2016  . Ulcer of left lower leg ([place]) 05/19/2016  . Severe obesity (BMI >= 40) ([place]) 02/24/2016  . COPD GOLD 0  02/24/2016  . Chronic systolic CHF (congestive heart failure) ([place]) 02/12/2016  . Encounter for therapeutic drug monitoring 02/10/2016  . Symptomatic bradycardia 01/12/2016  . Essential hypertension 12/22/2015  . Acute on chronic combined systolic and diastolic CHF (congestive heart failure) ([place])   . Wheeze   . 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 Tobacco abuse 10/23/2015  . Coronary artery disease   . DOE (dyspnea on exertion) 04/29/2015  . Diabetes mellitus type 2, uncontrolled ([place]) 02/08/2015  . Influenza  A 02/07/2015  . Wrist fracture, left, with routine healing, subsequent encounter 02/05/2015  . Wrist fracture, left, closed, initial encounter 01/29/2015  . PAF (paroxysmal atrial fibrillation) ([place]) 01/16/2015  . Carotid arterial disease ([place]) 01/16/2015  . Claudication ([place]) 01/15/2015  . Demand ischemia ([place]) 10/29/2014  . Insomnia 02/03/2014  . S/P peripheral artery angioplasty - [place]bo[place]awk atherectomy; R SFA 09/11/2013    Class: Acute  . Leg pain, bilateral 08/19/2013  . Hypothyroidism 07/31/2013  . Cellulitis 06/13/2013  . History of cocaine abuse ([place]) 06/13/2013  . Long term current use of anticoagulant therapy 05/20/2013  . Alcohol abuse   . Narcotic abuse ([place])   . Marijuana abuse   . Alcoholic cirrhosis ([place])   . DM (diabetes mellitus), type 2 with peripheral vascular complications ([name])     Current Outpatient Medications:  .  albuterol (PROVENTIL HFA;VENTOLIN HFA) 108 (90 Base) MCG/ACT inhaler, Inhale 2 puffs into the lungs every 6 (six) hours as needed for wheezing or shortness of breath., Disp: 18 g, Rfl: 0 .  amiodarone (PACERONE) 100 MG tablet, TAKE 1 TABLET (100 MG TOTAL) BY MOUTH DAILY (Patient taking differently: Take 100 mg by mouth daily. ), Disp: 30 tablet, Rfl: 5 .  amLODipine (NORVASC) 10 MG tablet, TAKE 1 TABLET (10 MG TOTAL) BY MOUTH DAILY., Disp: 90 tablet, Rfl: 0 .  apixaban (ELIQUIS) 5 MG TABS tablet, Take 1 tablet (5 mg total) by mouth 2 (two) times daily., Disp: 60 tablet, Rfl: 0 .  atorvastatin (LIPITOR) 40 MG tablet, TAKE 1 TABLET BY MOUTH ONCE EVERY EVENING (Patient taking differently: Take 40 mg by mouth daily. TAKE  1 TABLET BY MOUTH ONCE EVERY EVENING), Disp: 30 tablet, Rfl: 0 .  benzonatate (TESSALON) 100 MG capsule, Take 1-2 capsules (100-200 mg total) by mouth 3 (three) times daily as needed for cough., Disp: 40 capsule, Rfl: 0 .  bisoprolol (ZEBETA) 5 MG tablet, Take 1 tablet (5 mg total) by mouth daily., Disp: 30 tablet, Rfl: 5 .  cholecalciferol  (VITAMIN D) 1000 units tablet, Take 1,000 Units by mouth daily. , Disp: , Rfl:  .  clopidogrel (PLAVIX) 75 MG tablet, Take 75 mg by mouth daily., Disp: , Rfl:  .  diclofenac sodium (VOLTAREN) 1 % GEL, APPLY 4 GRAMS TO THE AFFECTED AREAS TOPICALLY  4 TIMES PER DAY, Disp: , Rfl:  .  folic acid (FOLVITE) 1 MG tablet, Take 1 tablet (1 mg total) by mouth daily. (Patient taking differently: Take 1 mg by mouth daily. ), Disp: 30 tablet, Rfl: 0 .  Insulin Glargine (LANTUS SOLOSTAR) 100 UNIT/ML Solostar Pen, Inject 75 Units into the skin at bedtime. , Disp: , Rfl:  .  losartan (COZAAR) 100 MG tablet, Take 1 tablet (100 mg total) by mouth daily. (Patient taking differently: Take 100 mg by mouth at bedtime. ), Disp: 30 tablet, Rfl: 5 .  metolazone (ZAROXOLYN) 2.5 MG tablet, Take 1 tablet (2.5 mg total) by mouth once a week. On Thursdays, Disp: 1 tablet, Rfl: 0 .  Multiple Vitamin (MULTIVITAMIN WITH MINERALS) TABS tablet, Take 1 tablet by mouth daily., Disp: 30 tablet, Rfl: 0 .  potassium chloride SA (K-DUR,KLOR-CON) 20 MEQ tablet, Take 2 tablets (40 mEq total) by mouth daily., Disp: 60 tablet, Rfl: 5 .  spironolactone (ALDACTONE) 25 MG tablet, Take 1 tablet (25 mg total) by mouth daily., Disp: 30 tablet, Rfl: 5 .  torsemide (DEMADEX) 20 MG tablet, Take 2 tablets (40 mg total) by mouth 2 (two) times daily., Disp: 4 tablet, Rfl: 0 .  amoxicillin-clavulanate (AUGMENTIN) 875-125 MG tablet, Take 1 tablet by mouth 2 (two) times daily. (Patient not taking: Reported on 03/07/2019), Disp: 20 tablet, Rfl: 0 .  insulin aspart (NOVOLOG) 100 UNIT/ML injection, Inject 30 Units into the skin 3 (three) times daily before meals. (Patient taking differently: Inject 30 Units into the skin 3 (three) times daily before meals. ), Disp: 30 mL, Rfl: 0 .  levothyroxine (SYNTHROID, LEVOTHROID) 50 MCG tablet, Take 1 tablet (50 mcg total) by mouth daily., Disp: 30 tablet, Rfl: 0 .  thiamine 100 MG tablet, Take 1 tablet (100 mg total) by  mouth daily. (Patient not taking: Reported on 03/07/2019), Disp: 30 tablet, Rfl: 0 .  traZODone (DESYREL) 100 MG tablet, Take 100 mg by mouth at bedtime as needed for sleep., Disp: , Rfl:  Allergies  Allergen Reactions  . Gabapentin Nausea And Vomiting and Other (See Comments)    POSSIBLE SHAKING? TAKING MED CURRENTLY  . Lyrica [Pregabalin] Other (See Comments)    Shaking Pt still taking lyrica--"probably will stop taking"       Social History   Socioeconomic History  . Marital status: Divorced    Spouse name: Not on file  . Number of children: 1  . Years of education: 40  . Highest education level: 12th grade  Occupational History  . Occupation: disabled  Social Needs  . Financial resource strain: Somewhat hard  . Food insecurity:    Worry: Never true    Inability: Never true  . Transportation needs:    Medical: No    Non-medical: No  Tobacco Use  . Smoking status:  Current Some Day Smoker    Packs/day: 1.00    Years: 44.00    Pack years: 44.00    Types: E-cigarettes, Cigarettes  . Smokeless tobacco: Former Systems developer    Types: Snuff  Substance and Sexual Activity  . Alcohol use: Yes    Comment: 11/26/2018 "couple drinks/month maybe"  . Drug use: Yes    Types: "Crack" cocaine, Marijuana, Oxycodone    Comment: 11/26/2018 "last hard drug use was ~ 09/08/2013; still smoke marijuana ~ once/month"  . Sexual activity: Not Currently  Lifestyle  . Physical activity:    Days per week: Not on file    Minutes per session: Not on file  . Stress: Not on file  Relationships  . Social connections:    Talks on phone: Not on file    Gets together: Not on file    Attends religious service: Not on file    Active member of club or organization: Not on file    Attends meetings of clubs or organizations: Not on file    Relationship status: Not on file  . Intimate partner violence:    Fear of current or ex partner: Not on file    Emotionally abused: Not on file    Physically abused: Not  on file    Forced sexual activity: Not on file  Other Topics Concern  . Not on file  Social History Narrative   Lives in [place], in [place] with sister.  They are looking to move but don't have a place to go yet.      Physical Exam      Future Appointments  Date Time Provider [place]  03/18/2019  9:15 AM Edrick Kins, DPM [facility]  03/18/2019  9:45 AM Velora Heckler [facility]  04/25/2019  1:00 PM [facility]  04/25/2019  3:00 PM [facility]    BP 124/82   Pulse 82   Temp 97.7 F (36.5 C)   Resp 16   Wt 234 lb (106.1 kg)   LMP 11/27/2012   SpO2 99%   BMI 40.17 kg/m   Weight yesterday-234  Last visit weight-?? She didn't weigh   Pt reports she is doing ok, her breathing is doing ok, she has been walking around outside when weather permits and no issues with breathing but her leg pain is what gets her to stop. She states she has wounds on her bottom of her feet. She goes to podiatry on 5/4 for wound check and to get her shoes--I advised her to call them to be seen sooner if possible.  Weight ranging between 232-238 at times. Diet indiscretions--- She went to urgent care and was d/c with bronchitis. She completed her antibiotics.  She has wheezing in all lobes. She did use her inhaler this morning.   Also eating the moms meals--she states not too tasty but she is not accustomed to the low sodium foods.  She has been managing herself without further assistance from myself. She has bubble packs that is delivered to her. No mistakes in those.  She is able to make it to her appointments without issues. She is able to be d/c from paramedicine program at this time.    Marylouise Stacks, [facility] Paramedic  03/07/19

## 2019-03-18 ENCOUNTER — Encounter (HOSPITAL_COMMUNITY): Payer: Self-pay | Admitting: Emergency Medicine

## 2019-03-18 ENCOUNTER — Ambulatory Visit: Payer: Medicare Other | Admitting: Podiatry

## 2019-03-18 ENCOUNTER — Other Ambulatory Visit: Payer: Self-pay

## 2019-03-18 ENCOUNTER — Emergency Department (HOSPITAL_COMMUNITY)
Admission: EM | Admit: 2019-03-18 | Discharge: 2019-03-18 | Disposition: A | Discharge status: Home / Self Care | Payer: Medicare Other | Attending: Emergency Medicine | Admitting: Emergency Medicine

## 2019-03-18 ENCOUNTER — Ambulatory Visit: Payer: Medicare Other | Admitting: Orthotics

## 2019-03-18 DIAGNOSIS — I5042 Chronic combined systolic (congestive) and diastolic (congestive) heart failure: Secondary | ICD-10-CM | POA: Insufficient documentation

## 2019-03-18 DIAGNOSIS — N289 Disorder of kidney and ureter, unspecified: Secondary | ICD-10-CM

## 2019-03-18 DIAGNOSIS — Z7901 Long term (current) use of anticoagulants: Secondary | ICD-10-CM | POA: Insufficient documentation

## 2019-03-18 DIAGNOSIS — Z79899 Other long term (current) drug therapy: Secondary | ICD-10-CM | POA: Diagnosis not present

## 2019-03-18 DIAGNOSIS — E039 Hypothyroidism, unspecified: Secondary | ICD-10-CM | POA: Diagnosis not present

## 2019-03-18 DIAGNOSIS — Z794 Long term (current) use of insulin: Secondary | ICD-10-CM | POA: Insufficient documentation

## 2019-03-18 DIAGNOSIS — J449 Chronic obstructive pulmonary disease, unspecified: Secondary | ICD-10-CM | POA: Insufficient documentation

## 2019-03-18 DIAGNOSIS — E1122 Type 2 diabetes mellitus with diabetic chronic kidney disease: Secondary | ICD-10-CM | POA: Diagnosis not present

## 2019-03-18 DIAGNOSIS — F419 Anxiety disorder, unspecified: Secondary | ICD-10-CM | POA: Diagnosis present

## 2019-03-18 DIAGNOSIS — I130 Hypertensive heart and chronic kidney disease with heart failure and stage 1 through stage 4 chronic kidney disease, or unspecified chronic kidney disease: Secondary | ICD-10-CM | POA: Insufficient documentation

## 2019-03-18 DIAGNOSIS — E11649 Type 2 diabetes mellitus with hypoglycemia without coma: Secondary | ICD-10-CM | POA: Diagnosis not present

## 2019-03-18 DIAGNOSIS — N183 Chronic kidney disease, stage 3 (moderate): Secondary | ICD-10-CM | POA: Diagnosis not present

## 2019-03-18 DIAGNOSIS — F17210 Nicotine dependence, cigarettes, uncomplicated: Secondary | ICD-10-CM | POA: Diagnosis not present

## 2019-03-18 DIAGNOSIS — E162 Hypoglycemia, unspecified: Secondary | ICD-10-CM

## 2019-03-18 LAB — URINALYSIS, ROUTINE W REFLEX MICROSCOPIC
Bilirubin Urine: NEGATIVE
Glucose, UA: NEGATIVE mg/dL
Hgb urine dipstick: NEGATIVE
Ketones, ur: NEGATIVE mg/dL
Leukocytes,Ua: NEGATIVE
Nitrite: NEGATIVE
Protein, ur: NEGATIVE mg/dL
Specific Gravity, Urine: 1.013 (ref 1.005–1.030)
pH: 5 (ref 5.0–8.0)

## 2019-03-18 LAB — CBG MONITORING, ED
Glucose-Capillary: 42 mg/dL — CL (ref 70–99)
Glucose-Capillary: 72 mg/dL (ref 70–99)
Glucose-Capillary: 81 mg/dL (ref 70–99)

## 2019-03-18 LAB — CBC
HCT: 30.9 % — ABNORMAL LOW (ref 36.0–46.0)
Hemoglobin: 9.5 g/dL — ABNORMAL LOW (ref 12.0–15.0)
MCH: 30.5 pg (ref 26.0–34.0)
MCHC: 30.7 g/dL (ref 30.0–36.0)
MCV: 99.4 fL (ref 80.0–100.0)
Platelets: 427 10*3/uL — ABNORMAL HIGH (ref 150–400)
RBC: 3.11 MIL/uL — ABNORMAL LOW (ref 3.87–5.11)
RDW: 15.5 % (ref 11.5–15.5)
WBC: 10.8 10*3/uL — ABNORMAL HIGH (ref 4.0–10.5)
nRBC: 0 % (ref 0.0–0.2)

## 2019-03-18 LAB — BASIC METABOLIC PANEL
Anion gap: 14 (ref 5–15)
BUN: 46 mg/dL — ABNORMAL HIGH (ref 6–20)
CO2: 22 mmol/L (ref 22–32)
Calcium: 9.3 mg/dL (ref 8.9–10.3)
Chloride: 109 mmol/L (ref 98–111)
Creatinine, Ser: 2.1 mg/dL — ABNORMAL HIGH (ref 0.44–1.00)
GFR calc Af Amer: 30 mL/min — ABNORMAL LOW (ref >60)
GFR calc non Af Amer: 26 mL/min — ABNORMAL LOW (ref >60)
Glucose, Bld: 48 mg/dL — ABNORMAL LOW (ref 70–99)
Potassium: 4.4 mmol/L (ref 3.5–5.1)
Sodium: 145 mmol/L (ref 135–145)

## 2019-03-18 LAB — I-STAT BETA HCG BLOOD, ED (MC, WL, AP ONLY): I-stat hCG, quantitative: <5 m[IU]/mL (ref <5)

## 2019-03-18 NOTE — ED Triage Notes (Signed)
The patient reports missing her foot appt this morning due to over sleeping. She wants to be checked out for her legs, sugar, and for feeling weird. Pt is alert/o x4 NAD.

## 2019-03-18 NOTE — ED Provider Notes (Signed)
[facility] DEPARTMENT Provider Note   CSN: [id number] Arrival date & time: 03/18/19  1114    History   Chief Complaint Chief Complaint  Patient presents with  . multiple complaints    HPI [name] is a 58 y.o. female.     HPI  -year-old female history of CHF, diabetes, and COPD.  She has chronic GERD.  She states that she has doctor.  She states that she is somewhat anxious this morning.  Looking after her record, she had a blood sugar that was low here yesterday at 42 and has increased to 72 after some p.o. intake.  She reports taking her usual insulin dose of 75 at bedtime and 30 3 times daily.  She reports no change in her oral intake habits.  Has not had nausea, vomiting, diarrhea, fever, chills, or increased cough.  She states that people told her that she is missing but she has not identified any change herself.  She has some concern of the wound on the palm of her left foot.  States the pain is improved several weeks ago and had increased.  She has some pain in the foot.  She has not noted any injury, redness, or discharge.  As noted above, she did not miss her podiatry appointment today.  Past Medical History:  Diagnosis Date  . Alcohol abuse   . Alcoholic cirrhosis ([place])   . Anemia   . Anxiety   . Arthritis    "knees" (11/26/2018)  . B12 deficiency   . CAD (coronary artery disease)    a. cath 11/10/2014 LM nl, LAD min irregs, D1 30 ost, D2 50d, LCX 39m, OM1 80 p/m (1.5 mm vessel), OM2 85m, RCA nondom 54m-->med rx.. Demand ischemia in the setting of rapid a-fib.  . Cardiomyopathy ([place])   . Carotid artery disease ([place])    a. 01/2015 Carotid Angio: RICA 250, LICA 53Z. s/p L carotid endarterectomy 02/2015.  . Cellulitis    lower extremities  . Chronic combined systolic and diastolic CHF (congestive heart failure) ([place])    a. Last echo 12/205: EF 40-45%, inferoapical/posterior HK, not technically sufficient to allow eval of LV diastolic  function, normal LA in size..  . Cocaine abuse ([place])   . COPD (chronic obstructive pulmonary disease) ([place])   . Critical lower limb ischemia   . Depression   . Diabetic peripheral neuropathy ([place])   . Elevated troponin    a. Chronic elevation.  [name] GERD (gastroesophageal reflux disease)   . Hyperlipemia   . Hypertension   . Hypokalemia   . Hypomagnesemia   . Hypothyroidism   . Marijuana abuse   . Narcotic abuse ([place])   . Noncompliance   . NSVT (nonsustained ventricular tachycardia) ([place])   . Obesity   . PAF (paroxysmal atrial fibrillation) ([place])   . Paroxysmal atrial tachycardia ([place])   . Peripheral arterial disease ([place])    a. 01/2015 Angio/PTA: LSFA 100 w/ recon @ adductor canal and 1 vessel runoff via AT, RSFA 99 (atherectomy/pta) - 1 vessel runoff via diff dzs peroneal.  . Pneumonia    "once or twice" (11/26/2018)  . Poorly controlled type 2 diabetes mellitus ([place])   . Renal insufficiency    a. 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  [name] Sleep apnea    "couldn't handle wearing the mask" (11/26/2018)  . Symptomatic bradycardia    avoid AV blocking agent per EP  . Tobacco abuse     Patient Active Problem List  Diagnosis Date Noted  . Acute CHF (congestive heart failure) ([place]) 11/26/2018  . CHF (congestive heart failure) ([place]) 11/26/2018  . Acute respiratory failure ([place]) 10/21/2018  . AKI (acute kidney injury) ([place]) 08/18/2018  . Coagulation disorder ([place]) 08/09/2017  . Depression 07/21/2017  . At risk for adverse drug reaction 06/20/2017  . Peripheral neuropathy 06/20/2017  . Acute osteomyelitis of right foot ([place]) 06/13/2017  . S/P transmetatarsal amputation of foot, right ([place]) 06/05/2017  . 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 PAD (peripheral artery disease) ([place])   . SIRS (systemic inflammatory response syndrome) ([place]) 04/06/2017  . CKD (chronic kidney disease), stage III ([place]) 11/24/2016  . Suspected sleep  apnea 11/24/2016  . Ulcer of toe of right foot, with necrosis of bone ([place]) 10/27/2016  . Ulcer of left lower leg ([place]) 05/19/2016  . Severe obesity (BMI >= 40) ([place]) 02/24/2016  . COPD GOLD 0  02/24/2016  . Chronic systolic CHF (congestive heart failure) ([place]) 02/12/2016  . Encounter for therapeutic drug monitoring 02/10/2016  . Symptomatic bradycardia 01/12/2016  . Essential hypertension 12/22/2015  . Acute on chronic combined systolic and diastolic CHF (congestive heart failure) ([place])   . Wheeze   . 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 Tobacco abuse 10/23/2015  . Coronary artery disease   . DOE (dyspnea on exertion) 04/29/2015  . Diabetes mellitus type 2, uncontrolled ([place]) 02/08/2015  . Influenza A 02/07/2015  . Wrist fracture, left, with routine healing, subsequent encounter 02/05/2015  . Wrist fracture, left, closed, initial encounter 01/29/2015  . PAF (paroxysmal atrial fibrillation) ([place]) 01/16/2015  . Carotid arterial disease ([place]) 01/16/2015  . Claudication ([place]) 01/15/2015  . Demand ischemia ([place]) 10/29/2014  . Insomnia 02/03/2014  . S/P peripheral artery angioplasty - [place]bo[place] atherectomy; R SFA 09/11/2013    Class: Acute  . Leg pain, bilateral 08/19/2013  . Hypothyroidism 07/31/2013  . Cellulitis 06/13/2013  . History of cocaine abuse ([place]) 06/13/2013  . Long term current use of anticoagulant therapy 05/20/2013  . Alcohol abuse   . Narcotic abuse ([place])   . Marijuana abuse   . Alcoholic cirrhosis ([place])   . DM (diabetes mellitus), type 2 with peripheral vascular complications [facility])     Past Surgical History:  Procedure Laterality Date  . AMPUTATION Right 06/14/2017   Procedure: Right foot transmetatarsal amputation;  Surgeon: Newt Minion, MD;  Location: [place];  Service: Orthopedics;  Laterality: Right;  . AMPUTATION TOE Right 04/28/2017   Procedure: AMPUTATION OF RIGHT SECOND [place];  Surgeon: Newt Minion, MD;  Location: [name];  Service: Orthopedics;   Laterality: Right;  . CARDIAC CATHETERIZATION    . CARDIAC CATHETERIZATION N/A 01/12/2016   Procedure: Temporary Wire;  Surgeon: Minus Breeding, MD;  Location: [place] CV LAB;  Service: Cardiovascular;  Laterality: N/A;  . CARDIOVERSION  ~ 02/2013   "twice"   . CAROTID ANGIOGRAM N/A 01/15/2015   Procedure: CAROTID ANGIOGRAM;  Surgeon: [name], MD;  Location: [facility] CATH LAB;  Service: Cardiovascular;  Laterality: N/A;  . DILATION AND CURETTAGE OF UTERUS  1988  . ENDARTERECTOMY Left 02/19/2015   Procedure: LEFT CAROTID ENDARTERECTOMY ;  Surgeon: Serafina Mitchell, MD;  Location: [place];  Service: Vascular;  Laterality: Left;  . LEFT HEART CATHETERIZATION WITH CORONARY ANGIOGRAM N/A 10/31/2014   Procedure: LEFT HEART CATHETERIZATION WITH CORONARY ANGIOGRAM;  Surgeon: Burnell Blanks, MD;  Location: [facility] CATH LAB;  Service: Cardiovascular;  Laterality:  N/A;  . LOWER EXTREMITY ANGIOGRAM N/A 09/10/2013   Procedure: LOWER EXTREMITY ANGIOGRAM;  Surgeon: [name], MD;  Location: [facility] CATH LAB;  Service: Cardiovascular;  Laterality: N/A;  . LOWER EXTREMITY ANGIOGRAM N/A 01/15/2015   Procedure: LOWER EXTREMITY ANGIOGRAM;  Surgeon: [name], MD;  [facility] C[facility] LAB;  Service: Cardiovascular;  Laterality: N/A;  . LOWER EXTREMITY ANGIOGRAPHY N/A 04/13/2017   Procedure: Lower Extremity Angiography;  Surgeon: [name], MD;  Location: [place] CV LAB;  Service: Cardiovascular;  Laterality: N/A;  . PERIPHERAL VASCULAR INTERVENTION  04/13/2017   Procedure: Peripheral Vascular Intervention;  Surgeon: [name], MD;  Location: [place] CV LAB;  Service: Cardiovascular;;     OB History    Gravida  1   Para  1   Term  1   Preterm      AB      Living  1     SAB      TAB      Ectopic      Multiple      Live Births               Home Medications    Prior to Admission medications   Medication Sig Start Date End Date Taking? Authorizing Provider   albuterol (PROVENTIL HFA;VENTOLIN HFA) 108 (90 Base) MCG/ACT inhaler Inhale 2 puffs into the lungs every 6 (six) hours as needed for wheezing or shortness of breath. 08/09/17   Medina-Vargas, Monina C, NP  amiodarone (PACERONE) 100 MG tablet TAKE 1 TABLET (100 MG TOTAL) BY MOUTH DAILY Patient taking differently: Take 100 mg by mouth daily.  09/20/18   Larey Dresser, MD  amLODipine (NORVASC) 10 MG tablet TAKE 1 TABLET (10 MG TOTAL) BY MOUTH DAILY. 03/04/19   Larey Dresser, MD  amoxicillin-clavulanate (AUGMENTIN) 875-125 MG tablet Take 1 tablet by mouth 2 (two) times daily. Patient not taking: Reported on 03/07/2019 02/19/19   Scot Jun, FNP  apixaban (ELIQUIS) 5 MG TABS tablet Take 1 tablet (5 mg total) by mouth 2 (two) times daily. 08/09/17   Medina-Vargas, Monina C, NP  atorvastatin (LIPITOR) 40 MG tablet TAKE 1 TABLET BY MOUTH ONCE EVERY EVENING Patient taking differently: Take 40 mg by mouth daily. TAKE 1 TABLET BY MOUTH ONCE EVERY EVENING 08/09/17   Medina-Vargas, Monina C, NP  benzonatate (TESSALON) 100 MG capsule Take 1-2 capsules (100-200 mg total) by mouth 3 (three) times daily as needed for cough. 02/19/19   Scot Jun, FNP  bisoprolol (ZEBETA) 5 MG tablet Take 1 tablet (5 mg total) by mouth daily. 12/04/18   Georgiana Shore, NP  cholecalciferol (VITAMIN D) 1000 units tablet Take 1,000 Units by mouth daily.     [provider]  clopidogrel (PLAVIX) 75 MG tablet Take 75 mg by mouth daily.    [provider]  diclofenac sodium (VOLTAREN) 1 % GEL APPLY 4 GRAMS TO THE AFFECTED AREAS TOPICALLY  4 TIMES PER DAY 12/08/18   [provider]  folic acid (FOLVITE) 1 MG tablet Take 1 tablet (1 mg total) by mouth daily. Patient taking differently: Take 1 mg by mouth daily.  10/25/18   Kayleen Memos, DO  insulin aspart (NOVOLOG) 100 UNIT/ML injection Inject 30 Units into the skin 3 (three) times daily before meals. Patient taking differently: Inject 30 Units into  the skin 3 (three) times daily before meals.  08/09/17   Medina-Vargas, Monina C, NP  Insulin Glargine (LANTUS  SOLOSTAR) 100 UNIT/ML Solostar Pen Inject 75 Units into the skin at bedtime.     [provider]  levothyroxine (SYNTHROID, LEVOTHROID) 50 MCG tablet Take 1 tablet (50 mcg total) by mouth daily. 08/09/17   Medina-Vargas, Monina C, NP  losartan (COZAAR) 100 MG tablet Take 1 tablet (100 mg total) by mouth daily. Patient taking differently: Take 100 mg by mouth at bedtime.  10/18/18   Larey Dresser, MD  metolazone (ZAROXOLYN) 2.5 MG tablet Take 1 tablet (2.5 mg total) by mouth once a week. On Thursdays 01/30/19   Shirley Friar, PA-C  Multiple Vitamin (MULTIVITAMIN WITH MINERALS) TABS tablet Take 1 tablet by mouth daily. 10/25/18   Kayleen Memos, DO  potassium chloride SA (K-DUR,KLOR-CON) 20 MEQ tablet Take 2 tablets (40 mEq total) by mouth daily. 12/04/18   Georgiana Shore, NP  spironolactone (ALDACTONE) 25 MG tablet Take 1 tablet (25 mg total) by mouth daily. 12/04/18   Georgiana Shore, NP  thiamine 100 MG tablet Take 1 tablet (100 mg total) by mouth daily. Patient not taking: Reported on 03/07/2019 10/25/18   Kayleen Memos, DO  torsemide (DEMADEX) 20 MG tablet Take 2 tablets (40 mg total) by mouth 2 (two) times daily. 01/30/19   Shirley Friar, PA-C  traZODone (DESYREL) 100 MG tablet Take 100 mg by mouth at bedtime as needed for sleep.    [provider]    Family History Family History  Problem Relation Age of Onset  . Hypertension Mother   . Diabetes Mother   . Cancer Mother        breast, ovarian, colon  . Clotting disorder Mother   . Heart disease Mother   . Heart attack Mother   . Breast cancer Mother        in 11's  . Hypertension Father   . Heart disease Father   . Emphysema Sister        smoked    Social History Social History   Tobacco Use  . Smoking status: Current Some Day Smoker    Packs/day: 1.00    Years: 44.00    Pack  years: 44.00    Types: E-cigarettes, Cigarettes  . Smokeless tobacco: Former Systems developer    Types: Snuff  Substance Use Topics  . Alcohol use: Yes    Comment: 11/26/2018 "couple drinks/month maybe"  . Drug use: Yes    Types: "Crack" cocaine, Marijuana, Oxycodone    Comment: 11/26/2018 "last hard drug use was ~ 09/08/2013; still smoke marijuana ~ once/month"     Allergies   Gabapentin and Lyrica [pregabalin]   Review of Systems Review of Systems  All other systems reviewed and are negative.    Physical Exam Updated Vital Signs BP (!) 109/58 (BP Location: Right Arm)   Pulse 81   Temp 98 F (36.7 C) (Oral)   Resp 18   Ht 1.626 m (5\' 4" )   Wt 106.1 kg   LMP 11/27/2012   SpO2 99%   BMI 40.17 kg/m   Physical Exam Vitals signs and nursing note reviewed.  Constitutional:      Appearance: Normal appearance. She is obese.  HENT:     Head: Normocephalic.     Right Ear: External ear normal.     Left Ear: External ear normal.     Nose: Nose normal.     Mouth/Throat:     Mouth: Mucous membranes are moist.  Eyes:     Pupils: Pupils are equal, round,  and reactive to light.  Neck:     Musculoskeletal: Normal range of motion.  Cardiovascular:     Rate and Rhythm: Normal rate.  Pulmonary:     Effort: Pulmonary effort is normal.  Abdominal:     General: Abdomen is flat.  Musculoskeletal: Normal range of motion.     Comments: Calluses noted on the heel of the left foot where she is complaining of pain There is no redness, fluctuance, tenderness to palpation  Skin:    General: Skin is warm.     Capillary Refill: Capillary refill takes less than 2 seconds.  Neurological:     General: No focal deficit present.     Mental Status: She is alert and oriented to person, place, and time. Mental status is at baseline.  Psychiatric:        Mood and Affect: Mood normal.      ED Treatments / Results  Labs (all labs ordered are listed, but only abnormal results are displayed) Labs  Reviewed  BASIC METABOLIC PANEL - Abnormal; Notable for the following components:      Result Value   Glucose, Bld 48 (*)    BUN 46 (*)    Creatinine, Ser 2.10 (*)    GFR calc non Af Amer 26 (*)    GFR calc Af Amer 30 (*)    All other components within normal limits  CBC - Abnormal; Notable for the following components:   WBC 10.8 (*)    RBC 3.11 (*)    Hemoglobin 9.5 (*)    HCT 30.9 (*)    Platelets 427 (*)    All other components within normal limits  CBG MONITORING, ED - Abnormal; Notable for the following components:   Glucose-Capillary 42 (*)    All other components within normal limits  URINALYSIS, ROUTINE W REFLEX MICROSCOPIC  I-STAT BETA HCG BLOOD, ED ([facility], [facility], AP ONLY)  CBG MONITORING, ED  CBG MONITORING, ED    EKG None  Radiology No results found.  Procedures Procedures (including critical care time)  Medications Ordered in ED Medications - No data to display   Initial Impression / Assessment and Plan / ED Course  I have reviewed the triage vital signs and the nursing notes.  Pertinent labs & imaging results that were available during my care of the patient were reviewed by me and considered in my medical decision making (see chart for details).       58 year old female with diabetes presents today with some confusion.  She had low blood sugar here which is consistent with her confusion cleared with increasing her blood sugar.  She is been observed here we are checking her blood sugar.  However, there is been no change in her intake or insulin to explain this alteration.  Her creatinine is slightly elevated today at 2.1 with her last 1 being 1.59.  She is also concerned about her foot pain corrected on exam shows no signs of correction. 1 altered mental status and hypoglycemia plan continue p.o. intake frequently today and will cut her bedtime insulin at half.  She is to call her doctor and arrange at home visit monitor for further instructions regarding her  insulin.  She does have a roommate and she has been given home instructions and return precautions regarding this 2 elevated creatinine last creatinine 1.59 today at 2.1.  She gives no history of decreased p.o. intake with this.  She is to have this followed up outpatient with her primary  care doctor 3 with infection left foot is having some pain but has no signs of trauma, injury, or infection.  She is advised to follow-up with her podiatrist regarding this  Final Clinical Impressions(s) / ED Diagnoses   Final diagnoses:  Hypoglycemia  Renal insufficiency    ED Discharge Orders    None       Pattricia Boss, MD 03/18/19 1622

## 2019-03-18 NOTE — ED Notes (Signed)
Patient verbalizes understanding of discharge instructions. Opportunity for questioning and answers were provided. Armband removed by staff, pt discharged from ED via wheelchair to home.  

## 2019-03-18 NOTE — ED Notes (Signed)
Pt given sandwich and diet coke to drink

## 2019-03-18 NOTE — Discharge Instructions (Signed)
Take only half of your insulin at bedtime.  Check your blood sugar every two hours and eat regularly. Call your primary care doctor tomorrow for recheck and further insulin instructions.  Your creatinine is up to 2.1 and will need to be rechecked in 3-7 days.  Please drink plenty of water.

## 2019-03-20 ENCOUNTER — Telehealth: Payer: Self-pay | Admitting: Cardiology

## 2019-03-20 NOTE — Telephone Encounter (Signed)
Smartphone/consent/ my chart/ pre reg completed °

## 2019-03-21 ENCOUNTER — Telehealth (INDEPENDENT_AMBULATORY_CARE_PROVIDER_SITE_OTHER): Payer: Medicare Other | Admitting: Cardiology

## 2019-03-21 ENCOUNTER — Telehealth: Payer: Self-pay

## 2019-03-21 ENCOUNTER — Encounter: Payer: Self-pay | Admitting: Cardiology

## 2019-03-21 VITALS — Ht 64.0 in | Wt 236.0 lb

## 2019-03-21 DIAGNOSIS — Z720 Tobacco use: Secondary | ICD-10-CM

## 2019-03-21 DIAGNOSIS — G63 Polyneuropathy in diseases classified elsewhere: Secondary | ICD-10-CM

## 2019-03-21 DIAGNOSIS — I10 Essential (primary) hypertension: Secondary | ICD-10-CM

## 2019-03-21 DIAGNOSIS — I480 Paroxysmal atrial fibrillation: Secondary | ICD-10-CM

## 2019-03-21 DIAGNOSIS — I5042 Chronic combined systolic (congestive) and diastolic (congestive) heart failure: Secondary | ICD-10-CM

## 2019-03-21 DIAGNOSIS — I739 Peripheral vascular disease, unspecified: Secondary | ICD-10-CM

## 2019-03-21 DIAGNOSIS — E1151 Type 2 diabetes mellitus with diabetic peripheral angiopathy without gangrene: Secondary | ICD-10-CM

## 2019-03-21 DIAGNOSIS — Z9862 Peripheral vascular angioplasty status: Secondary | ICD-10-CM

## 2019-03-21 DIAGNOSIS — N183 Chronic kidney disease, stage 3 unspecified: Secondary | ICD-10-CM

## 2019-03-21 DIAGNOSIS — Z89431 Acquired absence of right foot: Secondary | ICD-10-CM

## 2019-03-21 NOTE — Patient Instructions (Signed)
Medication Instructions:  Your physician recommends that you continue on your current medications as directed. Please refer to the Current Medication list given to you today. If you need a refill on your cardiac medications before your next appointment, please call your pharmacy.   Lab work: Your physician recommends that you return for lab work in: 1 week (03/27/2019) BMET  If you have labs (blood work) drawn today and your tests are completely normal, you will receive your results only by: [name] MyChart Message (if you have MyChart) OR . A paper copy in the mail If you have any lab test that is abnormal or we need to change your treatment, we will call you to review the results.  Testing/Procedures: None   Follow-Up: At [facility], you and your health needs are our priority.  As part of our continuing mission to provide you with exceptional heart care, we have created designated Provider Care Teams.  These Care Teams include your primary Cardiologist (physician) and Advanced Practice Providers (APPs -  Physician Assistants and Nurse Practitioners) who all work together to provide you with the care you need, when you need it.  . Your physician recommends that you schedule a follow-up appointment in: 3-4 months when the office opens follow up with Kerin Ransom, PA-C.  Any Other Special Instructions Will Be Listed Below (If Applicable).

## 2019-03-21 NOTE — Telephone Encounter (Signed)
Contacted patient to discuss AVS instructions. Patient agreeable with Luke recommendations and voiced understanding. 

## 2019-03-21 NOTE — Progress Notes (Signed)
Virtual Visit via Telephone Note   This visit type was conducted due to national recommendations for restrictions regarding the COVID-19 Pandemic (e.g. social distancing) in an effort to limit this patient's exposure and mitigate transmission in our community.  Due to her co-morbid illnesses, this patient is at least at moderate risk for complications without adequate follow up.  This format is felt to be most appropriate for this patient at this time.  The patient did not have access to video technology/had technical difficulties with video requiring transitioning to audio format only (telephone).  All issues noted in this document were discussed and addressed.  No physical exam could be performed with this format.  Please refer to the patient's chart for her  consent to telehealth for [facility].   Date:  03/21/2019   ID:  [name], DOB 28-Aug-1961, MRN [id number]  Patient Location: Home Provider Location: Home  PCP:  Tsosie Billing, MD  Cardiologist:  Quay Burow, MD  Electrophysiologist:  None   Evaluation Performed:  Follow-Up Visit  Chief Complaint:  Leg pain  History of Present Illness:    [name] is a 58 y.o. female with multiple medical problems.  She has a history of nonischemic cardiomyopathy and congestive heart failure.  She is been followed in the heart failure clinic.  Her last ejection fraction was 45% in December 2019.  She has PAF and is on Eliquis and Amiodarone.  She has hypertensive cardiovascular disease, last echo Dec 2019 showed her EF to be 45-50% with mild LVH and grade 2 DD.  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  She was unable to tolerate Neurontin and Lyrica.  She also has extensive vascular disease and is been followed by Dr. Gwenlyn Found.  She had a right SFA intervention in 2014, a left carotid endarterectomy in April 2016 had a redo right SFA intervention in May 2018.  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  She was admitted 10/5 through 08/19/2018 with alcohol intoxication after she passed out in the parking lot.  She was discharged the next day.  She was admitted 11/26/18 with marked volume overload in the setting of noncompliance with medications and diet. She was diuresed with IV lasix and transitioned to torsemide 40 mg twice a day. Her hospital course was complicated by AKI. Discharge weight was 236 pounds. She was seen in the CHF clinic in follow up 12/18/2018.   Most recently she was in the ED with "multiple complaints", mainly leg pain which is felt to be secondary to neuropathy.  Her SCr was elevated for her- 2.10.  She was also hypoglycemic and her insulin was adjusted.   Today her main complaint is her leg pain- neuropathy.  She takes Tylenol for this. She denies increased SOB.  She does have "bronchitis" which is being treated by her PCP.  She does not have a B/P cuff at home but says she has one that is to be mailed to her. Weight is stable today.   The patient does not have symptoms concerning for COVID-19 infection (fever, chills, cough, or new shortness of breath).    Past Medical History:  Diagnosis Date  . Alcohol abuse   . Alcoholic cirrhosis ([place])   . Anemia   . Anxiety   . Arthritis    "knees" (11/26/2018)  . B12 deficiency   . CAD (coronary artery disease)    a. cath 11/10/2014 LM nl, LAD min irregs, D1  30 ost, D2 50d, LCX 83m, OM1 80 p/m (1.5 mm vessel), OM2 25m, RCA nondom 51m-->med rx.. Demand ischemia in the setting of rapid a-fib.  . Cardiomyopathy ([place])   . Carotid artery disease ([place])    a. 01/2015 Carotid Angio: RICA 644, LICA 03K. s/p L carotid endarterectomy 02/2015.  . Cellulitis    lower extremities  . Chronic combined systolic and diastolic CHF (congestive heart failure) ([place])    a. Last echo 12/205: EF 40-45%, inferoapical/posterior HK, not technically sufficient to allow eval of LV diastolic function, normal LA in size..  . Cocaine abuse ([place])   .  COPD (chronic obstructive pulmonary disease) ([place])   . Critical lower limb ischemia   . Depression   . Diabetic peripheral neuropathy ([place])   . Elevated troponin    a. Chronic elevation.  [name] GERD (gastroesophageal reflux disease)   . Hyperlipemia   . Hypertension   . Hypokalemia   . Hypomagnesemia   . Hypothyroidism   . Marijuana abuse   . Narcotic abuse ([place])   . Noncompliance   . NSVT (nonsustained ventricular tachycardia) ([place])   . Obesity   . PAF (paroxysmal atrial fibrillation) ([place])   . Paroxysmal atrial tachycardia ([place])   . Peripheral arterial disease ([place])    a. 01/2015 Angio/PTA: LSFA 100 w/ recon @ adductor canal and 1 vessel runoff via AT, RSFA 99 (atherectomy/pta) - 1 vessel runoff via diff dzs peroneal.  . Pneumonia    "once or twice" (11/26/2018)  . Poorly controlled type 2 diabetes mellitus ([place])   . Renal insufficiency    a. 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  [name] Sleep apnea    "couldn't handle wearing the mask" (11/26/2018)  . Symptomatic bradycardia    avoid AV blocking agent per EP  . Tobacco abuse    Past Surgical History:  Procedure Laterality Date  . AMPUTATION Right 06/14/2017   Procedure: Right foot transmetatarsal amputation;  Surgeon: Newt Minion, MD;  Location: [place];  Service: Orthopedics;  Laterality: Right;  . AMPUTATION TOE Right 04/28/2017   Procedure: AMPUTATION OF RIGHT SECOND RAY;  Surgeon: Newt Minion, MD;  Location: [place];  Service: Orthopedics;  Laterality: Right;  . CARDIAC CATHETERIZATION    . CARDIAC CATHETERIZATION N/A 01/12/2016   Procedure: Temporary Wire;  Surgeon: Minus Breeding, MD;  Location: [place] CV LAB;  Service: Cardiovascular;  Laterality: N/A;  . CARDIOVERSION  ~ 02/2013   "twice"   . CAROTID ANGIOGRAM N/A 01/15/2015   Procedure: CAROTID ANGIOGRAM;  Surgeon: [name], MD;  Location: [facility] CATH LAB;  Service: Cardiovascular;  Laterality: N/A;  . DILATION AND CURETTAGE OF UTERUS  1988  . ENDARTERECTOMY Left 02/19/2015    Procedure: LEFT CAROTID ENDARTERECTOMY ;  Surgeon: Serafina Mitchell, MD;  Location: [place];  Service: Vascular;  Laterality: Left;  . LEFT HEART CATHETERIZATION WITH CORONARY ANGIOGRAM N/A 10/31/2014   Procedure: LEFT HEART CATHETERIZATION WITH CORONARY ANGIOGRAM;  Surgeon: Burnell Blanks, MD;  Location[facility] C[facility] LAB;  Service: Cardiovascular;  Laterality: N/A;  . LOWER EXTREMITY ANGIOGRAM N/A 09/10/2013   Procedure: LOWER EXTREMITY ANGIOGRAM;  Surgeon: [name], MD;  Location: [facility] CATH LAB;  Service: Cardiovascular;  Laterality: N/A;  . LOWER EXTREMITY ANGIOGRAM N/A 01/15/2015   Procedure: LOWER EXTREMITY ANGIOGRAM;  Surgeon: [name], MD;  Location: [facility] CATH LAB;  Service: Cardiovascular;  Laterality: N/A;  . LOWER EXTREMITY ANGIOGRAPHY N/A 04/13/2017   Procedure: Lower Extremity Angiography;  Surgeon: [name],  MD;  Location: [place] CV LAB;  Service: Cardiovascular;  Laterality: N/A;  . PERIPHERAL VASCULAR INTERVENTION  04/13/2017   Procedure: Peripheral Vascular Intervention;  Surgeon: [name], MD;  Location: [place] CV LAB;  Service: Cardiovascular;;     Current Meds  Medication Sig  . albuterol (PROVENTIL HFA;VENTOLIN HFA) 108 (90 Base) MCG/ACT inhaler Inhale 2 puffs into the lungs every 6 (six) hours as needed for wheezing or shortness of breath.  [name] amiodarone (PACERONE) 100 MG tablet TAKE 1 TABLET (100 MG TOTAL) BY MOUTH DAILY (Patient taking differently: Take 100 mg by mouth daily. )  . amLODipine (NORVASC) 10 MG tablet TAKE 1 TABLET (10 MG TOTAL) BY MOUTH DAILY.  [name] apixaban (ELIQUIS) 5 MG TABS tablet Take 1 tablet (5 mg total) by mouth 2 (two) times daily.  [name] atorvastatin (LIPITOR) 40 MG tablet TAKE 1 TABLET BY MOUTH ONCE EVERY EVENING (Patient taking differently: Take 40 mg by mouth daily. TAKE 1 TABLET BY MOUTH ONCE EVERY EVENING)  . bisoprolol (ZEBETA) 5 MG tablet Take 1 tablet (5 mg total) by mouth daily.  . cholecalciferol (VITAMIN D)  1000 units tablet Take 1,000 Units by mouth daily.   . clopidogrel (PLAVIX) 75 MG tablet Take 75 mg by mouth daily.  . folic acid (FOLVITE) 1 MG tablet Take 1 tablet (1 mg total) by mouth daily. (Patient taking differently: Take 1 mg by mouth daily. )  . Insulin Glargine (LANTUS SOLOSTAR) 100 UNIT/ML Solostar Pen Inject 72 Units into the skin at bedtime.   . Insulin Lispro (HUMALOG KWIKPEN) 200 UNIT/ML SOPN Inject 35 Units into the skin 3 (three) times daily with meals.  [name] levothyroxine (SYNTHROID, LEVOTHROID) 50 MCG tablet Take 1 tablet (50 mcg total) by mouth daily.  [name] losartan (COZAAR) 100 MG tablet Take 1 tablet (100 mg total) by mouth daily. (Patient taking differently: Take 100 mg by mouth at bedtime. )  . metolazone (ZAROXOLYN) 2.5 MG tablet Take 1 tablet (2.5 mg total) by mouth once a week. On Thursdays  . Multiple Vitamin (MULTIVITAMIN WITH MINERALS) TABS tablet Take 1 tablet by mouth daily.  . potassium chloride SA (K-DUR,KLOR-CON) 20 MEQ tablet Take 2 tablets (40 mEq total) by mouth daily.  [name] spironolactone (ALDACTONE) 25 MG tablet Take 1 tablet (25 mg total) by mouth daily.  [name] thiamine 100 MG tablet Take 1 tablet (100 mg total) by mouth daily.  [name] torsemide (DEMADEX) 20 MG tablet Take 2 tablets (40 mg total) by mouth 2 (two) times daily.     Allergies:   Gabapentin and Lyrica [pregabalin]   Social History   Tobacco Use  . Smoking status: Current Some Day Smoker    Packs/day: 1.00    Years: 44.00    Pack years: 44.00    Types: E-cigarettes, Cigarettes  . Smokeless tobacco: Former Systems developer    Types: Snuff  Substance Use Topics  . Alcohol use: Yes    Comment: 11/26/2018 "couple drinks/month maybe"  . Drug use: Yes    Types: "Crack" cocaine, Marijuana, Oxycodone    Comment: 11/26/2018 "last hard drug use was ~ 09/08/2013; still smoke marijuana ~ once/month"     Family Hx: The patient's family history includes Breast cancer in her mother; Cancer in her mother; Clotting disorder in  her mother; Diabetes in her mother; Emphysema in her sister; Heart attack in her mother; Heart disease in her father and mother; Hypertension in her father and mother.  ROS:   Please see the history of  present illness.    All other systems reviewed and are negative.   Prior CV studies:   The following studies were reviewed today:   Labs/Other Tests and Data Reviewed:    EKG:  No ECG reviewed.  Recent Labs: 10/21/2018: TSH 1.157 11/27/2018: ALT 45; B Natriuretic Peptide 511.3 03/18/2019: BUN 46; Creatinine, Ser 2.10; Hemoglobin 9.5; Platelets 427; Potassium 4.4; Sodium 145   Recent Lipid Panel Lab Results  Component Value Date/Time   CHOL 146 03/27/2018 11:38 AM   TRIG 222 (H) 03/27/2018 11:38 AM   HDL 39 (L) 03/27/2018 11:38 AM   CHOLHDL 3.7 03/27/2018 11:38 AM   LDLCALC 63 03/27/2018 11:38 AM    Wt Readings from Last 3 Encounters:  03/21/19 236 lb (107 kg)  03/18/19 234 lb (106.1 kg)  03/07/19 234 lb (106.1 kg)     Objective:    Vital Signs:  Ht 5\' 4"  (1.626 m)   Wt 236 lb (107 kg)   LMP 11/27/2012   BMI 40.51 kg/m    VITAL SIGNS:  reviewed No acute distress on the phone ASSESSMENT & PLAN:    Chronic combined systolic and diastolic CHF Followed in the CHF clinic, she appears to be stable since her hospitalization in Jan 2020.  NICM- Last EF 45% Dec 2019  PAF- On Eliquis and Amiodarone- NSR on EKG 11/26/2018.  Diabetes with neuropathy- LE neuropathy, intolerant to Neurontin and Lyrica  CRI- 3-4 Last SCr was 2.1 on May 4th- GFR 26  COPD- Smoker, recent bronchitis  COVID-19 Education: The signs and symptoms of COVID-19 were discussed with the patient and how to seek care for testing (follow up with PCP or arrange E-visit).  The importance of social distancing was discussed today.  Time:   Today, I have spent  15 minutes with the patient with telehealth technology discussing the above problems.     Medication Adjustments/Labs and Tests Ordered:  Current medicines are reviewed at length with the patient today.  Concerns regarding medicines are outlined above.   Tests Ordered: No orders of the defined types were placed in this encounter.   Medication Changes: No orders of the defined types were placed in this encounter.   Disposition:  Follow up with me when the office opens in the Fall.  Angelena Form, PA-C  03/21/2019 9:13 AM    [name]

## 2019-03-23 NOTE — Progress Notes (Signed)
Greater than 5 minutes, yet less than 10 minutes of time have been spent researching, coordinating, and implementing care for this patient today.  Thank you for the details you included in the comment boxes. Those details are very helpful in determining the best course of treatment for you and help us to provide the best care.  

## 2019-03-25 ENCOUNTER — Ambulatory Visit (INDEPENDENT_AMBULATORY_CARE_PROVIDER_SITE_OTHER): Payer: Medicare Other | Admitting: Podiatry

## 2019-03-25 ENCOUNTER — Other Ambulatory Visit: Payer: Self-pay

## 2019-03-25 ENCOUNTER — Encounter: Payer: Self-pay | Admitting: Podiatry

## 2019-03-25 ENCOUNTER — Emergency Department (HOSPITAL_COMMUNITY)
Admission: EM | Admit: 2019-03-25 | Discharge: 2019-03-25 | Disposition: A | Discharge status: Home / Self Care | Payer: Medicare Other | Attending: Emergency Medicine | Admitting: Emergency Medicine

## 2019-03-25 ENCOUNTER — Encounter (HOSPITAL_COMMUNITY): Payer: Self-pay | Admitting: Emergency Medicine

## 2019-03-25 VITALS — Temp 97.7°F

## 2019-03-25 DIAGNOSIS — R51 Headache: Secondary | ICD-10-CM | POA: Diagnosis present

## 2019-03-25 DIAGNOSIS — E0843 Diabetes mellitus due to underlying condition with diabetic autonomic (poly)neuropathy: Secondary | ICD-10-CM | POA: Diagnosis not present

## 2019-03-25 DIAGNOSIS — M79676 Pain in unspecified toe(s): Secondary | ICD-10-CM

## 2019-03-25 DIAGNOSIS — M542 Cervicalgia: Secondary | ICD-10-CM

## 2019-03-25 DIAGNOSIS — Z89431 Acquired absence of right foot: Secondary | ICD-10-CM

## 2019-03-25 DIAGNOSIS — L97422 Non-pressure chronic ulcer of left heel and midfoot with fat layer exposed: Secondary | ICD-10-CM

## 2019-03-25 DIAGNOSIS — B351 Tinea unguium: Secondary | ICD-10-CM

## 2019-03-25 DIAGNOSIS — I739 Peripheral vascular disease, unspecified: Secondary | ICD-10-CM

## 2019-03-25 MED ORDER — METHOCARBAMOL 750 MG PO TABS: 750.0000 mg | ORAL_TABLET | Freq: Four times a day (QID) | ORAL | 0 refills | Status: DC

## 2019-03-25 MED ORDER — IBUPROFEN 600 MG PO TABS: 600.0000 mg | ORAL_TABLET | Freq: Four times a day (QID) | ORAL | 0 refills | Status: DC | PRN

## 2019-03-25 MED ORDER — IBUPROFEN 400 MG PO TABS
600.0000 mg | ORAL_TABLET | Freq: Once | ORAL | Status: AC
  Administered 2019-03-25: 600 mg via ORAL
  Filled 2019-03-25: qty 1

## 2019-03-25 MED ORDER — DIAZEPAM 5 MG PO TABS
10.0000 mg | ORAL_TABLET | Freq: Once | ORAL | Status: AC
  Administered 2019-03-25: 10 mg via ORAL
  Filled 2019-03-25: qty 2

## 2019-03-25 NOTE — ED Provider Notes (Signed)
Patient seen/examined in the Emergency Department in conjunction with Midlevel Provider [place] Patient reports neck pain  Exam : awake/alert, appears chronically ill but no acute distress.  Point tenderness to palpation of left paracervical region.  No bruising/erythema.  Full ROM of neck.  No focal motor deficits in upper extremities Plan: suspect musculoskeletal etiology Appropriate for d/c home    Ripley Fraise, MD 03/25/19 [phone]

## 2019-03-25 NOTE — ED Triage Notes (Signed)
Pt BIB EMS c/o sudden onset headache starting Friday. Pt. States pain 10 on scale 0-10 describes the pain as sharp/stabbing, radiates to left neck and arm. Pt. States taking tylenol and goody powder with no relief.   Pt. A&Ox4  EMS VS BP 166/874  RR 18 SpO2 96 RA HR 121 T 97.5

## 2019-03-25 NOTE — ED Provider Notes (Signed)
[facility] EMERGENCY DEPARTMENT Provider Note   CSN: [id number] Arrival date & time: 03/25/19  0226    History   Chief Complaint Chief Complaint  Patient presents with  . Headache    HPI [name] BIRKEL is a 58 y.o. female.     Patient with complicated medical history presents with headache that started 3 days ago and has been constant since it started despite use of Goody's Power and Tylenol. HA is located bilaterally across the back of the head and along the left neck. She feels increased pain with movement. No photophobia/phnophobia, nausea. No history of headaches and no injury. She reports tingling in her left hand but this is not a new symptom. No weakness of the left arm or hand. No history of neck pain or known radicular symptoms.   The history is provided by the patient. No language interpreter was used.  Headache  Associated symptoms: neck pain   Associated symptoms: no abdominal pain, no cough, no fever, no nausea and no vomiting     Past Medical History:  Diagnosis Date  . Alcohol abuse   . Alcoholic cirrhosis ([place])   . Anemia   . Anxiety   . Arthritis    "knees" (11/26/2018)  . B12 deficiency   . CAD (coronary artery disease)    a. cath 11/10/2014 LM nl, LAD min irregs, D1 30 ost, D2 50d, LCX 54m, OM1 80 p/m (1.5 mm vessel), OM2 41m, RCA nondom 26m-->med rx.. Demand ischemia in the setting of rapid a-fib.  . Cardiomyopathy ([place])   . Carotid artery disease ([place])    a. 01/2015 Carotid Angio: RICA 740, LICA 81K. s/p L carotid endarterectomy 02/2015.  . Cellulitis    lower extremities  . Chronic combined systolic and diastolic CHF (congestive heart failure) ([place])    a. Last echo 12/205: EF 40-45%, inferoapical/posterior HK, not technically sufficient to allow eval of LV diastolic function, normal LA in size..  . Cocaine abuse ([place])   . COPD (chronic obstructive pulmonary disease) ([place])   . Critical lower limb ischemia   . Depression   .  Diabetic peripheral neuropathy ([place])   . Elevated troponin    a. Chronic elevation.  [name] GERD (gastroesophageal reflux disease)   . Hyperlipemia   . Hypertension   . Hypokalemia   . Hypomagnesemia   . Hypothyroidism   . Marijuana abuse   . Narcotic abuse ([place])   . Noncompliance   . NSVT (nonsustained ventricular tachycardia) ([place])   . Obesity   . PAF (paroxysmal atrial fibrillation) ([place])   . Paroxysmal atrial tachycardia ([place])   . Peripheral arterial disease ([place])    a. 01/2015 Angio/PTA: LSFA 100 w/ recon @ adductor canal and 1 vessel runoff via AT, RSFA 99 (atherectomy/pta) - 1 vessel runoff via diff dzs peroneal.  . Pneumonia    "once or twice" (11/26/2018)  . Poorly controlled type 2 diabetes mellitus ([place])   . Renal insufficiency    a. 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  [name] Sleep apnea    "couldn't handle wearing the mask" (11/26/2018)  . Symptomatic bradycardia    avoid AV blocking agent per EP  . Tobacco abuse     Patient Active Problem List   Diagnosis Date Noted  . Acute CHF (congestive heart failure) ([place]) 11/26/2018  . CHF (congestive heart failure) ([place]) 11/26/2018  . Acute respiratory failure ([place]) 10/21/2018  . AKI (acute kidney injury) ([place]) 08/18/2018  . Coagulation disorder ([place]) 08/09/2017  . Depression  07/21/2017  . At risk for adverse drug reaction 06/20/2017  . Peripheral neuropathy 06/20/2017  . Acute osteomyelitis of right foot ([place]) 06/13/2017  . S/P transmetatarsal amputation of foot, right ([place]) 06/05/2017  . 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 PAD (peripheral artery disease) ([place])   . SIRS (systemic inflammatory response syndrome) ([place]) 04/06/2017  . CKD (chronic kidney disease), stage III ([place]) 11/24/2016  . Suspected sleep apnea 11/24/2016  . Ulcer of toe of right foot, with necrosis of bone ([place]) 10/27/2016  . Ulcer of left lower leg ([place]) 05/19/2016  . Severe obesity (BMI >= 40)  ([place]) 02/24/2016  . COPD GOLD 0  02/24/2016  . Encounter for therapeutic drug monitoring 02/10/2016  . Symptomatic bradycardia 01/12/2016  . Essential hypertension 12/22/2015  . Chronic combined systolic and diastolic CHF (congestive heart failure) ([place])   . Wheeze   . 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 Tobacco abuse 10/23/2015  . Coronary artery disease   . DOE (dyspnea on exertion) 04/29/2015  . Diabetes mellitus type 2, uncontrolled ([place]) 02/08/2015  . Influenza A 02/07/2015  . Wrist fracture, left, with routine healing, subsequent encounter 02/05/2015  . Wrist fracture, left, closed, initial encounter 01/29/2015  . PAF (paroxysmal atrial fibrillation) ([place]) 01/16/2015  . Carotid arterial disease ([place]) 01/16/2015  . Claudication ([place]) 01/15/2015  . Demand ischemia ([place]) 10/29/2014  . Insomnia 02/03/2014  . S/P peripheral artery angioplasty - [place] atherectomy; R SFA 09/11/2013    Class: Acute  . Leg pain, bilateral 08/19/2013  . Hypothyroidism 07/31/2013  . Cellulitis 06/13/2013  . History of cocaine abuse ([place]) 06/13/2013  . Long term current use of anticoagulant therapy 05/20/2013  . Alcohol abuse   . Narcotic abuse ([place])   . Marijuana abuse   . Alcoholic cirrhosis ([place])   . DM (diabetes mellitus), type 2 with peripheral vascular complications [facility])     Past Surgical History:  Procedure Laterality Date  . AMPUTATION Right 06/14/2017   Procedure: Right foot transmetatarsal amputation;  Surgeon: Newt Minion, MD;  Location: [facility];  Service: Orthopedics;  Laterality: Right;  . AMPUTATION TOE Right 04/28/2017   Procedure: AMPUTATION OF RIGHT SECOND RAY;  Surgeon: Newt Minion, MD;  Location: [facility];  Service: Orthopedics;  Laterality: Right;  . CARDIAC CATHETERIZATION    . CARDIAC CATHETERIZATION N/A 01/12/2016   Procedure: Temporary Wire;  Surgeon: Minus Breeding, MD;  Location: [facility] CV LAB;  Service: Cardiovascular;  Laterality: N/A;  . CARDIOVERSION  ~  02/2013   "twice"   . CAROTID ANGIOGRAM N/A 01/15/2015   Procedure: CAROTID ANGIOGRAM;  Surgeon: [name], MD;  Location: [facility] CATH LAB;  Service: Cardiovascular;  Laterality: N/A;  . DILATION AND CURETTAGE OF UTERUS  1988  . ENDARTERECTOMY Left 02/19/2015   Procedure: LEFT CAROTID ENDARTERECTOMY ;  Surgeon: Serafina Mitchell, MD;  Location: [facility];  Service: Vascular;  Laterality: Left;  . LEFT HEART CATHETERIZATION WITH CORONARY ANGIOGRAM N/A 10/31/2014   Procedure: LEFT HEART CATHETERIZATION WITH CORONARY ANGIOGRAM;  Surgeon: Burnell Blanks, MD;  Location: [facility] CATH LAB;  Service: Cardiovascular;  Laterality: N/A;  . LOWER EXTREMITY ANGIOGRAM N/A 09/10/2013   Procedure: LOWER EXTREMITY ANGIOGRAM;  Surgeon: [name], MD;  Location: [facility] CATH LAB;  Service: Cardiovascular;  Laterality: N/A;  . LOWER EXTREMITY ANGIOGRAM N/A 01/15/2015   Procedure: LOWER EXTREMITY ANGIOGRAM;  Surgeon: [name], MD;  Location: [facility] CATH LAB;  Service: Cardiovascular;  Laterality: N/A;  . LOWER EXTREMITY ANGIOGRAPHY N/A 04/13/2017   Procedure: Lower Extremity Angiography;  Surgeon: [name], MD;  Location: [facility] CV LAB;  Service: Cardiovascular;  Laterality: N/A;  . PERIPHERAL VASCULAR INTERVENTION  04/13/2017   Procedure: Peripheral Vascular Intervention;  Surgeon: [name], MD;  Location: [facility] CV LAB;  Service: Cardiovascular;;     OB History    Gravida  1   Para  1   Term  1   Preterm      AB      Living  1     SAB      TAB      Ectopic      Multiple      Live Births               Home Medications    Prior to Admission medications   Medication Sig Start Date End Date Taking? Authorizing Provider  albuterol (PROVENTIL HFA;VENTOLIN HFA) 108 (90 Base) MCG/ACT inhaler Inhale 2 puffs into the lungs every 6 (six) hours as needed for wheezing or shortness of breath. 08/09/17   Medina-Vargas, Monina C, NP  amiodarone (PACERONE) 100 MG tablet TAKE  1 TABLET (100 MG TOTAL) BY MOUTH DAILY Patient taking differently: Take 100 mg by mouth daily.  09/20/18   Larey Dresser, MD  amLODipine (NORVASC) 10 MG tablet TAKE 1 TABLET (10 MG TOTAL) BY MOUTH DAILY. 03/04/19   Larey Dresser, MD  apixaban (ELIQUIS) 5 MG TABS tablet Take 1 tablet (5 mg total) by mouth 2 (two) times daily. 08/09/17   Medina-Vargas, Monina C, NP  atorvastatin (LIPITOR) 40 MG tablet TAKE 1 TABLET BY MOUTH ONCE EVERY EVENING Patient taking differently: Take 40 mg by mouth daily. TAKE 1 TABLET BY MOUTH ONCE EVERY EVENING 08/09/17   Medina-Vargas, Monina C, NP  bisoprolol (ZEBETA) 5 MG tablet Take 1 tablet (5 mg total) by mouth daily. 12/04/18   Georgiana Shore, NP  cholecalciferol (VITAMIN D) 1000 units tablet Take 1,000 Units by mouth daily.     [provider]  clopidogrel (PLAVIX) 75 MG tablet Take 75 mg by mouth daily.    [provider]  folic acid (FOLVITE) 1 MG tablet Take 1 tablet (1 mg total) by mouth daily. Patient taking differently: Take 1 mg by mouth daily.  10/25/18   Kayleen Memos, DO  Insulin Glargine (LANTUS SOLOSTAR) 100 UNIT/ML Solostar Pen Inject 72 Units into the skin at bedtime.     [provider]  Insulin Lispro (HUMALOG KWIKPEN) 200 UNIT/ML SOPN Inject 35 Units into the skin 3 (three) times daily with meals.    [provider]  levothyroxine (SYNTHROID, LEVOTHROID) 50 MCG tablet Take 1 tablet (50 mcg total) by mouth daily. 08/09/17   Medina-Vargas, Monina C, NP  losartan (COZAAR) 100 MG tablet Take 1 tablet (100 mg total) by mouth daily. Patient taking differently: Take 100 mg by mouth at bedtime.  10/18/18   Larey Dresser, MD  metolazone (ZAROXOLYN) 2.5 MG tablet Take 1 tablet (2.5 mg total) by mouth once a week. On Thursdays 01/30/19   Shirley Friar, PA-C  Multiple Vitamin (MULTIVITAMIN WITH MINERALS) TABS tablet Take 1 tablet by mouth daily. 10/25/18   Kayleen Memos, DO  potassium chloride SA  (K-DUR,KLOR-CON) 20 MEQ tablet Take 2 tablets (40 mEq total) by mouth daily. 12/04/18   Georgiana Shore, NP  spironolactone (ALDACTONE) 25 MG tablet Take 1  tablet (25 mg total) by mouth daily. 12/04/18   Georgiana Shore, NP  thiamine 100 MG tablet Take 1 tablet (100 mg total) by mouth daily. 10/25/18   Kayleen Memos, DO  torsemide (DEMADEX) 20 MG tablet Take 2 tablets (40 mg total) by mouth 2 (two) times daily. 01/30/19   Shirley Friar, PA-C    Family History Family History  Problem Relation Age of Onset  . Hypertension Mother   . Diabetes Mother   . Cancer Mother        breast, ovarian, colon  . Clotting disorder Mother   . Heart disease Mother   . Heart attack Mother   . Breast cancer Mother        in 88's  . Hypertension Father   . Heart disease Father   . Emphysema Sister        smoked    Social History Social History   Tobacco Use  . Smoking status: Current Some Day Smoker    Packs/day: 1.00    Years: 44.00    Pack years: 44.00    Types: E-cigarettes, Cigarettes  . Smokeless tobacco: Former Systems developer    Types: Snuff  Substance Use Topics  . Alcohol use: Yes    Comment: 11/26/2018 "couple drinks/month maybe"  . Drug use: Yes    Types: "Crack" cocaine, Marijuana, Oxycodone    Comment: last used 3 days ago      Allergies   Gabapentin and Lyrica [pregabalin]   Review of Systems Review of Systems  Constitutional: Negative for chills, diaphoresis and fever.  HENT: Negative.   Eyes: Negative for visual disturbance.  Respiratory: Negative.  Negative for cough and shortness of breath.   Cardiovascular: Negative.   Gastrointestinal: Negative.  Negative for abdominal pain, nausea and vomiting.  Musculoskeletal: Positive for neck pain.  Skin: Negative.   Neurological: Positive for headaches.     Physical Exam Updated Vital Signs BP (!) 121/54   Pulse 81   Temp 98 F (36.7 C) (Oral)   Resp (!) 21   Ht 5\' 4"  (1.626 m)   Wt 108.4 kg   LMP 11/27/2012    SpO2 100%   BMI 41.02 kg/m   Physical Exam Constitutional:      Appearance: She is well-developed.     Comments: There is no scalp redness or tenderness. No focal mastoid tenderness or color change.   HENT:     Head: Normocephalic and atraumatic.   Neck:     Musculoskeletal: Normal range of motion.  Cardiovascular:     Rate and Rhythm: Normal rate.  Pulmonary:     Effort: Pulmonary effort is normal.  Chest:     Chest wall: No tenderness.  Abdominal:     Tenderness: There is no abdominal tenderness.  Musculoskeletal:     Comments: Tenderness to midline and left paracervical spine, extending to lateral neck. No swelling or redness. Full and equal grip strength of UE's bilaterally.   Skin:    General: Skin is warm and dry.  Neurological:     Mental Status: She is alert and oriented to person, place, and time.     Comments: CN's 3-12 grossly intact. Speech is clear and focused. No facial asymmetry. No lateralizing weakness. Reflexes are equal. No deficits of coordination.       ED Treatments / Results  Labs (all labs ordered are listed, but only abnormal results are displayed) Labs Reviewed - No data to display  EKG None  Radiology  No results found.  Procedures Procedures (including critical care time)  Medications Ordered in ED Medications  diazepam (VALIUM) tablet 10 mg (has no administration in time range)  ibuprofen (ADVIL) tablet 600 mg (has no administration in time range)     Initial Impression / Assessment and Plan / ED Course  I have reviewed the triage vital signs and the nursing notes.  Pertinent labs & imaging results that were available during my care of the patient were reviewed by me and considered in my medical decision making (see chart for details).        Patient to ED with headache x 3 days. No fever or signs of illness. No injury. Goody's power and Tylenol with relief.   The patient is well appearing. VSS. Nonfocal neurologic exam. She  has reproducible tenderness of left neck. Feel symptoms are likely musculoskeletal. Doubt migraine, radiculopathy, intracranial bleed or stroke.   Valium and iburpofen ordered. Will observe for improvement.   Patient sleeping soundly on re-examination. On waking, she complains of persistent pain. She has been seen by Dr. Christy Gentles who agrees with likely musculoskeletal cause of pain.   Will Rx for Robaxin and Naproxen. Recommend close PCP follow up.  Final Clinical Impressions(s) / ED Diagnoses   Final diagnoses:  None   1. Musculoskeletal neck pain  ED Discharge Orders    None       Charlann Lange, PA-C 03/25/19 2257    Ripley Fraise, MD 03/25/19 [phone]

## 2019-03-25 NOTE — Discharge Instructions (Addendum)
Take medications as prescribed for muscular neck pain.  Please call your doctor and follow up in office for further management if no better in 2-3 days.

## 2019-03-28 ENCOUNTER — Other Ambulatory Visit: Payer: Self-pay

## 2019-03-28 ENCOUNTER — Ambulatory Visit (INDEPENDENT_AMBULATORY_CARE_PROVIDER_SITE_OTHER): Payer: Medicare Other | Admitting: Orthotics

## 2019-03-28 DIAGNOSIS — I739 Peripheral vascular disease, unspecified: Secondary | ICD-10-CM | POA: Diagnosis not present

## 2019-03-28 DIAGNOSIS — E0843 Diabetes mellitus due to underlying condition with diabetic autonomic (poly)neuropathy: Secondary | ICD-10-CM

## 2019-03-28 DIAGNOSIS — L97422 Non-pressure chronic ulcer of left heel and midfoot with fat layer exposed: Secondary | ICD-10-CM | POA: Diagnosis not present

## 2019-03-28 DIAGNOSIS — B351 Tinea unguium: Secondary | ICD-10-CM

## 2019-03-28 DIAGNOSIS — Z89431 Acquired absence of right foot: Secondary | ICD-10-CM | POA: Diagnosis not present

## 2019-03-28 DIAGNOSIS — M79676 Pain in unspecified toe(s): Secondary | ICD-10-CM

## 2019-03-28 NOTE — Progress Notes (Signed)
SUBJECTIVE Patient with a history of diabetes mellitus presents to office today complaining of elongated, thickened nails that cause pain while ambulating in shoes. She is unable to trim her own nails.  She also reports continued pain secondary to neuropathy to both feet bilaterally. She states the pain is constant and feels like pins and needles. She has been fitted for DM shoes which she is to pick up in the next few days. Patient is here for further evaluation and treatment.  Past Medical History:  Diagnosis Date  . Alcohol abuse   . Alcoholic cirrhosis ([place])   . Anemia   . Anxiety   . Arthritis    "knees" (11/26/2018)  . B12 deficiency   . CAD (coronary artery disease)    a. cath 11/10/2014 LM nl, LAD min irregs, D1 30 ost, D2 50d, LCX 3m, OM1 80 p/m (1.5 mm vessel), OM2 15m, RCA nondom 19m-->med rx.. Demand ischemia in the setting of rapid a-fib.  . Cardiomyopathy ([name])   . Carotid artery disease ([place])    a. 01/2015 Carotid Angio: RICA 263, LICA 33L. s/p L carotid endarterectomy 02/2015.  . Cellulitis    lower extremities  . Chronic combined systolic and diastolic CHF (congestive heart failure) ([place])    a. Last echo 12/205: EF 40-45%, inferoapical/posterior HK, not technically sufficient to allow eval of LV diastolic function, normal LA in size..  . Cocaine abuse ([place])   . COPD (chronic obstructive pulmonary disease) ([place])   . Critical lower limb ischemia   . Depression   . Diabetic peripheral neuropathy ([place])   . Elevated troponin    a. Chronic elevation.  [name] GERD (gastroesophageal reflux disease)   . Hyperlipemia   . Hypertension   . Hypokalemia   . Hypomagnesemia   . Hypothyroidism   . Marijuana abuse   . Narcotic abuse ([place])   . Noncompliance   . NSVT (nonsustained ventricular tachycardia) ([place])   . Obesity   . PAF (paroxysmal atrial fibrillation) ([place])   . Paroxysmal atrial tachycardia ([place])   . Peripheral arterial disease ([place])    a. 01/2015 Angio/PTA: LSFA 100  w/ recon @ adductor canal and 1 vessel runoff via AT, RSFA 99 (atherectomy/pta) - 1 vessel runoff via diff dzs peroneal.  . Pneumonia    "once or twice" (11/26/2018)  . Poorly controlled type 2 diabetes mellitus ([place])   . Renal insufficiency    a. 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  [name] Sleep apnea    "couldn't handle wearing the mask" (11/26/2018)  . Symptomatic bradycardia    avoid AV blocking agent per EP  . Tobacco abuse     OBJECTIVE General Patient is awake, alert, and oriented x 3 and in no acute distress.  Derm Skin is dry and supple bilateral. Nails are tender, long, thickened and dystrophic with subungual debris, consistent with onychomycosis, 1-5 bilateral. No signs of infection noted.  Wound #1 noted to the left heel measuring 0.1 x 0.3 x 0.1 cm.   To the above-noted ulceration, there is no eschar. There is a moderate amount of slough, fibrin and necrotic tissue. Granulation tissue and wound base is red. There is no malodor. There is a minimal amount of serosanginous drainage noted. Periwound integrity is intact.   Vasc  DP and PT pedal pulses diminished bilaterally. Temperature gradient within normal limits.   Neuro Epicritic and protective threshold sensation absent bilaterally.   Musculoskeletal Exam TMA of the right foot noted. No symptomatic pedal deformities noted bilateral. Muscular strength  within normal limits.  ASSESSMENT 1. Diabetes Mellitus w/ peripheral neuropathy 2. Onychomycosis of nail due to dermatophyte bilateral 3. H/o TMA right foot 4. PVD BLE 5. Ulceration of the left heel secondary to diabetes mellitus and PVD  PLAN OF CARE 1. Patient evaluated today. 2. Instructed to maintain good pedal hygiene and foot care. Stressed importance of controlling blood sugar.  3. Mechanical debridement of nails 1-5 bilaterally performed using a nail nipper. Filed with dremel without incident.  4. Continue care with vascular for chronic PVD.  5. Medically necessary excisional  debridement including subcutaneous tissue was performed using a tissue nipper and a chisel blade. Excisional debridement of all the necrotic nonviable tissue down to healthy bleeding viable tissue was performed with post-debridement measurements same as pre-. 6. The wound was cleansed and dry sterile dressing applied. Recommended antibiotic ointment daily with a bandage.  7. Patient has an appointment to pick up DM shoes on 03/28/2019.  8. Return to clinic in 3 months.    Edrick Kins, DPM [facility]  Dr. Edrick Kins, [place]                                        [place],  [address]                Office [phone]  Fax [phone]

## 2019-03-28 NOTE — Progress Notes (Signed)

## 2019-04-02 ENCOUNTER — Other Ambulatory Visit (HOSPITAL_COMMUNITY): Payer: Self-pay | Admitting: Cardiology

## 2019-04-02 DIAGNOSIS — E1151 Type 2 diabetes mellitus with diabetic peripheral angiopathy without gangrene: Secondary | ICD-10-CM

## 2019-04-02 DIAGNOSIS — I5022 Chronic systolic (congestive) heart failure: Secondary | ICD-10-CM

## 2019-04-02 DIAGNOSIS — I10 Essential (primary) hypertension: Secondary | ICD-10-CM

## 2019-04-14 ENCOUNTER — Encounter (HOSPITAL_COMMUNITY): Payer: Self-pay | Admitting: *Deleted

## 2019-04-14 ENCOUNTER — Other Ambulatory Visit: Payer: Self-pay

## 2019-04-14 ENCOUNTER — Emergency Department (HOSPITAL_COMMUNITY)
Admission: EM | Admit: 2019-04-14 | Discharge: 2019-04-14 | Disposition: A | Discharge status: Home / Self Care | Payer: Medicare Other | Attending: Emergency Medicine | Admitting: Emergency Medicine

## 2019-04-14 DIAGNOSIS — Z794 Long term (current) use of insulin: Secondary | ICD-10-CM | POA: Insufficient documentation

## 2019-04-14 DIAGNOSIS — E1122 Type 2 diabetes mellitus with diabetic chronic kidney disease: Secondary | ICD-10-CM | POA: Diagnosis not present

## 2019-04-14 DIAGNOSIS — J449 Chronic obstructive pulmonary disease, unspecified: Secondary | ICD-10-CM | POA: Insufficient documentation

## 2019-04-14 DIAGNOSIS — E039 Hypothyroidism, unspecified: Secondary | ICD-10-CM | POA: Diagnosis not present

## 2019-04-14 DIAGNOSIS — N183 Chronic kidney disease, stage 3 (moderate): Secondary | ICD-10-CM | POA: Insufficient documentation

## 2019-04-14 DIAGNOSIS — Z79899 Other long term (current) drug therapy: Secondary | ICD-10-CM | POA: Diagnosis not present

## 2019-04-14 DIAGNOSIS — F17290 Nicotine dependence, other tobacco product, uncomplicated: Secondary | ICD-10-CM | POA: Diagnosis not present

## 2019-04-14 DIAGNOSIS — W548XXA Other contact with dog, initial encounter: Secondary | ICD-10-CM | POA: Diagnosis not present

## 2019-04-14 DIAGNOSIS — Z7902 Long term (current) use of antithrombotics/antiplatelets: Secondary | ICD-10-CM | POA: Insufficient documentation

## 2019-04-14 DIAGNOSIS — Y999 Unspecified external cause status: Secondary | ICD-10-CM | POA: Insufficient documentation

## 2019-04-14 DIAGNOSIS — I2510 Atherosclerotic heart disease of native coronary artery without angina pectoris: Secondary | ICD-10-CM | POA: Diagnosis not present

## 2019-04-14 DIAGNOSIS — Y939 Activity, unspecified: Secondary | ICD-10-CM | POA: Insufficient documentation

## 2019-04-14 DIAGNOSIS — S6992XA Unspecified injury of left wrist, hand and finger(s), initial encounter: Secondary | ICD-10-CM | POA: Diagnosis present

## 2019-04-14 DIAGNOSIS — Z7901 Long term (current) use of anticoagulants: Secondary | ICD-10-CM | POA: Insufficient documentation

## 2019-04-14 DIAGNOSIS — Y929 Unspecified place or not applicable: Secondary | ICD-10-CM | POA: Insufficient documentation

## 2019-04-14 DIAGNOSIS — I130 Hypertensive heart and chronic kidney disease with heart failure and stage 1 through stage 4 chronic kidney disease, or unspecified chronic kidney disease: Secondary | ICD-10-CM | POA: Diagnosis not present

## 2019-04-14 DIAGNOSIS — Z23 Encounter for immunization: Secondary | ICD-10-CM | POA: Insufficient documentation

## 2019-04-14 DIAGNOSIS — I5042 Chronic combined systolic (congestive) and diastolic (congestive) heart failure: Secondary | ICD-10-CM | POA: Insufficient documentation

## 2019-04-14 DIAGNOSIS — S61412A Laceration without foreign body of left hand, initial encounter: Secondary | ICD-10-CM

## 2019-04-14 MED ORDER — TETANUS-DIPHTH-ACELL PERTUSSIS 5-2.5-18.5 LF-MCG/0.5 IM SUSP
0.5000 mL | Freq: Once | INTRAMUSCULAR | Status: AC
  Administered 2019-04-14: 0.5 mL via INTRAMUSCULAR
  Filled 2019-04-14: qty 0.5

## 2019-04-14 NOTE — Discharge Instructions (Addendum)
You were seen in the ED for a skin tears to the back of her hand. Xarelto causes  prolonged bleeding.    The wound was small enough and the skin on your hand is very thin, making a suture/stitch repair difficult.  Given that it is small I think that it will heal with a good pressure dressing.  Pressure dressing was applied today.  Keep this dressing on for the next 48 hours.  After 48 hours, gently remove the dressing and apply a new one.  Look at the skin and make sure there is no signs of infection.  Return to the ED if there is increased pain, swelling, persistent bleeding, warmth, redness, pus or fevers.  Your tetanus was updated today.

## 2019-04-14 NOTE — ED Notes (Signed)
Patient verbalizes understanding of discharge instructions . Opportunity for questions and answers were provided . Armband removed by staff ,Pt discharged from ED. W/C  offered at D/C  and Declined W/C at D/C and was escorted to lobby by RN.  

## 2019-04-14 NOTE — ED Triage Notes (Signed)
PT reports her dog hit the back of her hand on Friday and cut the back of her hand . Pt reports the site will not stop bleeding. Pt does reports taking Eliquis 2 times a day.

## 2019-04-14 NOTE — ED Provider Notes (Signed)
[facility] EMERGENCY DEPARTMENT Provider Note   CSN: [id number] Arrival date & time: 04/14/19  1327    History   Chief Complaint Chief Complaint  Patient presents with  . Hand Problem    HPI [name] is a 58 y.o. female with extensive past medical history, chronic Xarelto twice daily, is here for evaluation of wounds to the back of her left hand sustained 2 days ago.  Patient states her dog accidentally scratched her.  She has been keeping the wound clean with warm water and soap but states it keeps oozing blood.  She denies any significant pain, purulent drainage, redness, warmth, fevers, distal paresthesias or loss of sensation.  No alleviating factors.     HPI  Past Medical History:  Diagnosis Date  . Alcohol abuse   . Alcoholic cirrhosis ([place])   . Anemia   . Anxiety   . Arthritis    "knees" (11/26/2018)  . B12 deficiency   . CAD (coronary artery disease)    a. cath 11/10/2014 LM nl, LAD min irregs, D1 30 ost, D2 50d, LCX 68m, OM1 80 p/m (1.5 mm vessel), OM2 33m, RCA nondom 35m-->med rx.. Demand ischemia in the setting of rapid a-fib.  . Cardiomyopathy ([place])   . Carotid artery disease ([place])    a. 01/2015 Carotid Angio: RICA 892, LICA 11H. s/p L carotid endarterectomy 02/2015.  . Cellulitis    lower extremities  . Chronic combined systolic and diastolic CHF (congestive heart failure) ([place])    a. Last echo 12/205: EF 40-45%, inferoapical/posterior HK, not technically sufficient to allow eval of LV diastolic function, normal LA in size..  . Cocaine abuse ([place])   . COPD (chronic obstructive pulmonary disease) ([place])   . Critical lower limb ischemia   . Depression   . Diabetic peripheral neuropathy ([place])   . Elevated troponin    a. Chronic elevation.  [name] GERD (gastroesophageal reflux disease)   . Hyperlipemia   . Hypertension   . Hypokalemia   . Hypomagnesemia   . Hypothyroidism   . Marijuana abuse   . Narcotic abuse ([place])   . Noncompliance   .  NSVT (nonsustained ventricular tachycardia) ([place])   . Obesity   . PAF (paroxysmal atrial fibrillation) ([place])   . Paroxysmal atrial tachycardia ([place])   . Peripheral arterial disease ([place])    a. 01/2015 Angio/PTA: LSFA 100 w/ recon @ adductor canal and 1 vessel runoff via AT, RSFA 99 (atherectomy/pta) - 1 vessel runoff via diff dzs peroneal.  . Pneumonia    "once or twice" (11/26/2018)  . Poorly controlled type 2 diabetes mellitus ([place])   . Renal insufficiency    a. 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  [name] Sleep apnea    "couldn't handle wearing the mask" (11/26/2018)  . Symptomatic bradycardia    avoid AV blocking agent per EP  . Tobacco abuse     Patient Active Problem List   Diagnosis Date Noted  . Acute CHF (congestive heart failure) ([place]) 11/26/2018  . CHF (congestive heart failure) ([place]) 11/26/2018  . Acute respiratory failure ([place]) 10/21/2018  . AKI (acute kidney injury) ([place]) 08/18/2018  . Coagulation disorder ([place]) 08/09/2017  . Depression 07/21/2017  . At risk for adverse drug reaction 06/20/2017  . Peripheral neuropathy 06/20/2017  . Acute osteomyelitis of right foot ([place]) 06/13/2017  . S/P transmetatarsal amputation of foot, right ([place]) 06/05/2017  . 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 PAD (  peripheral artery disease) ([place])   . SIRS (systemic inflammatory response syndrome) ([place]) 04/06/2017  . CKD (chronic kidney disease), stage III ([place]) 11/24/2016  . Suspected sleep apnea 11/24/2016  . Ulcer of toe of right foot, with necrosis of bone ([place]) 10/27/2016  . Ulcer of left lower leg ([place]) 05/19/2016  . Severe obesity (BMI >= 40) ([place]) 02/24/2016  . COPD GOLD 0  02/24/2016  . Encounter for therapeutic drug monitoring 02/10/2016  . Symptomatic bradycardia 01/12/2016  . Essential hypertension 12/22/2015  . Chronic combined systolic and diastolic CHF (congestive heart failure) ([place])   . Wheeze   . Anemia- b 12  deficiency 11/08/2015  . Tobacco abuse 10/23/2015  . Coronary artery disease   . DOE (dyspnea on exertion) 04/29/2015  . Diabetes mellitus type 2, uncontrolled ([place]) 02/08/2015  . Influenza A 02/07/2015  . Wrist fracture, left, with routine healing, subsequent encounter 02/05/2015  . Wrist fracture, left, closed, initial encounter 01/29/2015  . PAF (paroxysmal atrial fibrillation) ([place]) 01/16/2015  . Carotid arterial disease ([place]) 01/16/2015  . Claudication ([place]) 01/15/2015  . Demand ischemia ([place]) 10/29/2014  . Insomnia 02/03/2014  . S/P peripheral artery angioplasty - [place]bo[place] atherectomy; R SFA 09/11/2013    Class: Acute  . Leg pain, bilateral 08/19/2013  . Hypothyroidism 07/31/2013  . Cellulitis 06/13/2013  . History of cocaine abuse ([place]) 06/13/2013  . Long term current use of anticoagulant therapy 05/20/2013  . Alcohol abuse   . Narcotic abuse ([place])   . Marijuana abuse   . Alcoholic cirrhosis ([place])   . DM (diabetes mellitus), type 2 with peripheral vascular complications [facility])     Past Surgical History:  Procedure Laterality Date  . AMPUTATION Right 06/14/2017   Procedure: Right foot transmetatarsal amputation;  Surgeon: Newt Minion, MD;  Location: [facility];  Service: Orthopedics;  Laterality: Right;  . AMPUTATION TOE Right 04/28/2017   Procedure: AMPUTATION OF RIGHT SECOND RAY;  Surgeon: Newt Minion, MD;  Location: [facility];  Service: Orthopedics;  Laterality: Right;  . CARDIAC CATHETERIZATION    . CARDIAC CATHETERIZATION N/A 01/12/2016   Procedure: Temporary Wire;  Surgeon: Minus Breeding, MD;  Location: [facility];  Service: Cardiovascular;  Laterality: N/A;  . CARDIOVERSION  ~ 02/2013   "twice"   . CAROTID ANGIOGRAM N/A 01/15/2015   Procedure: CAROTID ANGIOGRAM;  Surgeon: [name], MD;  Location: [facility] CATH LAB;  Service: Cardiovascular;  Laterality: N/A;  . DILATION AND CURETTAGE OF UTERUS  1988  . ENDARTERECTOMY Left 02/19/2015   Procedure: LEFT CAROTID  ENDARTERECTOMY ;  Surgeon: Serafina Mitchell, MD;  Location: [facility];  Service: Vascular;  Laterality: Left;  . LEFT HEART CATHETERIZATION WITH CORONARY ANGIOGRAM N/A 10/31/2014   Procedure: LEFT HEART CATHETERIZATION WITH CORONARY ANGIOGRAM;  Surgeon: Burnell Blanks, MD;  Location: [facility] CATH LAB;  Service: Cardiovascular;  Laterality: N/A;  . LOWER EXTREMITY ANGIOGRAM N/A 09/10/2013   Procedure: LOWER EXTREMITY ANGIOGRAM;  Surgeon: [name], MD;  Location: [facility] CATH LAB;  Service: Cardiovascular;  Laterality: N/A;  . LOWER EXTREMITY ANGIOGRAM N/A 01/15/2015   Procedure: LOWER EXTREMITY ANGIOGRAM;  Surgeon: [name], MD;  Location: [facility] CATH LAB;  Service: Cardiovascular;  Laterality: N/A;  . LOWER EXTREMITY ANGIOGRAPHY N/A 04/13/2017   Procedure: Lower Extremity Angiography;  Surgeon: [name], MD;  Location: [facility] CV LAB;  Service: Cardiovascular;  Laterality: N/A;  . PERIPHERAL VASCULAR INTERVENTION  04/13/2017   Procedure: Peripheral Vascular Intervention;  Surgeon: Quay Burow  J, MD;  Location: [facility] CV LAB;  Service: Cardiovascular;;     OB History    Gravida  1   Para  1   Term  1   Preterm      AB      Living  1     SAB      TAB      Ectopic      Multiple      Live Births               Home Medications    Prior to Admission medications   Medication Sig Start Date End Date Taking? Authorizing Provider  albuterol (PROVENTIL HFA;VENTOLIN HFA) 108 (90 Base) MCG/ACT inhaler Inhale 2 puffs into the lungs every 6 (six) hours as needed for wheezing or shortness of breath. 08/09/17   Medina-Vargas, Monina C, NP  amiodarone (PACERONE) 100 MG tablet TAKE 1 TABLET (100 MG TOTAL) BY MOUTH DAILY Patient taking differently: Take 100 mg by mouth daily.  09/20/18   Larey Dresser, MD  amLODipine (NORVASC) 10 MG tablet TAKE 1 TABLET (10 MG TOTAL) BY MOUTH DAILY. 03/04/19   Larey Dresser, MD  apixaban (ELIQUIS) 5 MG TABS tablet Take 1  tablet (5 mg total) by mouth 2 (two) times daily. 08/09/17   Medina-Vargas, Monina C, NP  atorvastatin (LIPITOR) 40 MG tablet TAKE 1 TABLET BY MOUTH ONCE EVERY EVENING Patient taking differently: Take 40 mg by mouth daily. TAKE 1 TABLET BY MOUTH ONCE EVERY EVENING 08/09/17   Medina-Vargas, Monina C, NP  bisoprolol (ZEBETA) 5 MG tablet Take 1 tablet (5 mg total) by mouth daily. 12/04/18   Georgiana Shore, NP  cholecalciferol (VITAMIN D) 1000 units tablet Take 1,000 Units by mouth daily.     [provider]  clopidogrel (PLAVIX) 75 MG tablet Take 75 mg by mouth daily.    [provider]  folic acid (FOLVITE) 1 MG tablet Take 1 tablet (1 mg total) by mouth daily. Patient taking differently: Take 1 mg by mouth daily.  10/25/18   Kayleen Memos, DO  ibuprofen (ADVIL) 600 MG tablet Take 1 tablet (600 mg total) by mouth every 6 (six) hours as needed. 03/25/19   Charlann Lange, PA-C  Insulin Glargine (LANTUS SOLOSTAR) 100 UNIT/ML Solostar Pen Inject 72 Units into the skin at bedtime.     [provider]  Insulin Lispro (HUMALOG KWIKPEN) 200 UNIT/ML SOPN Inject 35 Units into the skin 3 (three) times daily with meals.    [provider]  levothyroxine (SYNTHROID, LEVOTHROID) 50 MCG tablet Take 1 tablet (50 mcg total) by mouth daily. 08/09/17   Medina-Vargas, Monina C, NP  losartan (COZAAR) 100 MG tablet TAKE 1 TABLET (100 MG TOTAL) BY MOUTH DAILY. 04/02/19   Bensimhon, Shaune Pascal, MD  methocarbamol (ROBAXIN-750) 750 MG tablet Take 1 tablet (750 mg total) by mouth 4 (four) times daily. 03/25/19   Charlann Lange, PA-C  metolazone (ZAROXOLYN) 2.5 MG tablet Take 1 tablet (2.5 mg total) by mouth once a week. On Thursdays 01/30/19   Shirley Friar, PA-C  Multiple Vitamin (MULTIVITAMIN WITH MINERALS) TABS tablet Take 1 tablet by mouth daily. 10/25/18   Kayleen Memos, DO  potassium chloride SA (K-DUR,KLOR-CON) 20 MEQ tablet Take 2 tablets (40 mEq total) by mouth daily. 12/04/18    Georgiana Shore, NP  spironolactone (ALDACTONE) 25 MG tablet Take 1 tablet (25 mg total) by mouth daily. 12/04/18   Georgiana Shore, NP  thiamine 100 MG tablet Take 1 tablet (100 mg total) by mouth daily. 10/25/18   Kayleen Memos, DO  torsemide (DEMADEX) 20 MG tablet Take 2 tablets (40 mg total) by mouth 2 (two) times daily. 01/30/19   Shirley Friar, PA-C    Family History Family History  Problem Relation Age of Onset  . Hypertension Mother   . Diabetes Mother   . Cancer Mother        breast, ovarian, colon  . Clotting disorder Mother   . Heart disease Mother   . Heart attack Mother   . Breast cancer Mother        in 38's  . Hypertension Father   . Heart disease Father   . Emphysema Sister        smoked    Social History Social History   Tobacco Use  . Smoking status: Current Some Day Smoker    Packs/day: 1.00    Years: 44.00    Pack years: 44.00    Types: E-cigarettes, Cigarettes  . Smokeless tobacco: Former Systems developer    Types: Snuff  Substance Use Topics  . Alcohol use: Yes    Comment: 11/26/2018 "couple drinks/month maybe"  . Drug use: Yes    Types: "Crack" cocaine, Marijuana, Oxycodone    Comment: last used 3 days ago      Allergies   Gabapentin and Lyrica [pregabalin]   Review of Systems Review of Systems  Skin: Positive for wound.  Hematological: Bruises/bleeds easily.  All other systems reviewed and are negative.    Physical Exam Updated Vital Signs BP 115/60 (BP Location: Right Arm)   Pulse 74   Temp 99.4 F (37.4 C) (Oral)   Resp 16   Ht 5\' 4"  (1.626 m)   Wt 106.6 kg   LMP 11/27/2012   SpO2 98%   BMI 40.34 kg/m   Physical Exam Constitutional:      Appearance: She is well-developed. She is not toxic-appearing.  HENT:     Head: Normocephalic.     Right Ear: External ear normal.     Left Ear: External ear normal.     Nose: Nose normal.  Eyes:     Conjunctiva/sclera: Conjunctivae normal.  Neck:     Musculoskeletal: Full passive  range of motion without pain.  Cardiovascular:     Rate and Rhythm: Normal rate.     Comments: Brisk cap refill to the left fingertips. Pulmonary:     Effort: Pulmonary effort is normal. No tachypnea or respiratory distress.  Musculoskeletal: Normal range of motion.     Comments: No focal bony tenderness to the left hand or digits.  Full active ROM of the left hand and digits without pain.  Skin:    General: Skin is warm and dry.     Capillary Refill: Capillary refill takes less than 2 seconds.     Findings: Wound present.     Comments: Patient has a total of 3 skin tears in triangular shape to the dorsum of her left hand, all are smaller than 1 cm.  2 of these skin tears are hemostatic.  1 of these skin tears is oozing some blood which is easily controlled with pressure.  No surrounding erythema, edema, fluctuance, drainage, warmth.  Neurological:     Mental Status: She is alert and oriented to person, place, and time.     Comments: Strength and sensation is intact in the left hand.  Psychiatric:        Behavior:  Behavior normal.        Thought Content: Thought content normal.      ED Treatments / Results  Labs (all labs ordered are listed, but only abnormal results are displayed) Labs Reviewed - No data to display  EKG None  Radiology No results found.  Procedures Procedures (including critical care time)  Medications Ordered in ED Medications  Tdap (BOOSTRIX) injection 0.5 mL (has no administration in time range)     Initial Impression / Assessment and Plan / ED Course  I have reviewed the triage vital signs and the nursing notes.  Pertinent labs & imaging results that were available during my care of the patient were reviewed by me and considered in my medical decision making (see chart for details).      58 year old on Xarelto is here for ongoing slow bleed from a very small left hand skin tear.  This was sustained from a animal scratch, no bite.  Exam is  reassuring without signs of superimposed infection, MSK injury.  Extremity is neurovascularly intact.  We will update her tetanus and apply a pressure dressing.  Given size of the wound and very thin skin on the dorsum of the hand, I think this will heal with a good pressure dressing.  We will defer suture repair today.  We will discharge with pressure dressing for the next 48 hours, wound care instructions and extra dressing supplies to be used at home.  Return precautions discussed.  Patient is comfortable with this plan.  Final Clinical Impressions(s) / ED Diagnoses   Final diagnoses:  Skin tear of left hand without complication, initial encounter    ED Discharge Orders    None       Kinnie Feil, PA-C 04/14/19 [place], Ankit, MD 04/15/19 1909

## 2019-04-22 ENCOUNTER — Other Ambulatory Visit: Payer: Self-pay | Admitting: Internal Medicine

## 2019-04-22 DIAGNOSIS — Z1231 Encounter for screening mammogram for malignant neoplasm of breast: Secondary | ICD-10-CM

## 2019-04-25 ENCOUNTER — Ambulatory Visit (HOSPITAL_COMMUNITY): Payer: Medicare Other

## 2019-04-25 ENCOUNTER — Encounter (HOSPITAL_COMMUNITY): Payer: Medicare Other

## 2019-04-30 ENCOUNTER — Other Ambulatory Visit (HOSPITAL_COMMUNITY): Payer: Self-pay | Admitting: Cardiology

## 2019-04-30 DIAGNOSIS — I10 Essential (primary) hypertension: Secondary | ICD-10-CM

## 2019-05-01 ENCOUNTER — Other Ambulatory Visit (HOSPITAL_COMMUNITY): Payer: Self-pay

## 2019-05-01 DIAGNOSIS — I5043 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) and diastolic (congestive) heart failure: Secondary | ICD-10-CM

## 2019-05-01 MED ORDER — POTASSIUM CHLORIDE CRYS ER 20 MEQ PO TBCR: 40.0000 meq | EXTENDED_RELEASE_TABLET | Freq: Every day | ORAL | 5 refills | Status: DC

## 2019-05-01 MED ORDER — BISOPROLOL FUMARATE 5 MG PO TABS: 5.0000 mg | ORAL_TABLET | Freq: Every day | ORAL | 5 refills | Status: DC

## 2019-05-01 MED ORDER — SPIRONOLACTONE 25 MG PO TABS: 25.0000 mg | ORAL_TABLET | Freq: Every day | ORAL | 5 refills | Status: DC

## 2019-05-01 NOTE — Addendum Note (Signed)
Addended by: Valeda Malm on: 05/01/2019 04:07 PM   Modules accepted: Orders

## 2019-05-03 IMAGING — DX DG CHEST 1V PORT
1 series · 1 of 1 positions shown · non-contrast
Comparison: Radiographs 06/13/2017

CLINICAL DATA: Wheezing.

EXAM:
PORTABLE CHEST 1 VIEW

[chest ap]
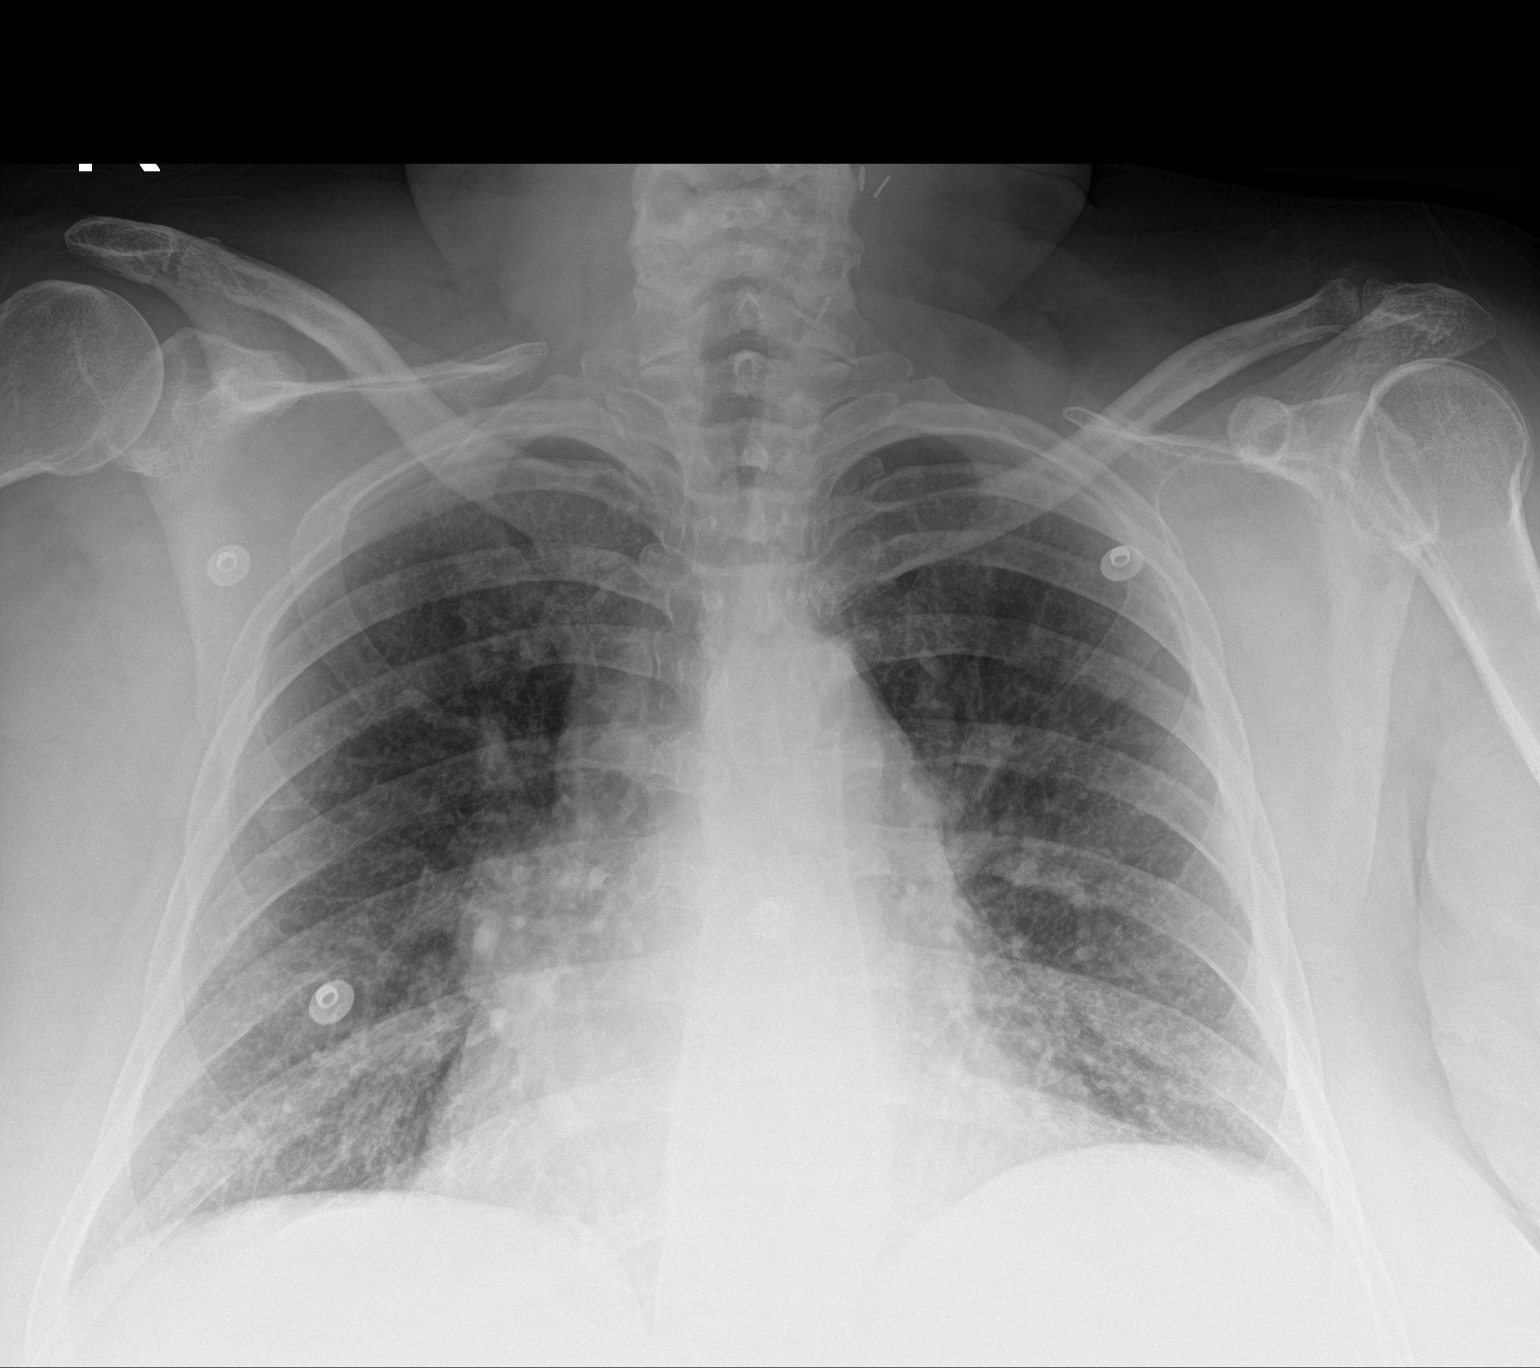

[1 of 1 positions shown; findings below may reference images not displayed]

FINDINGS: Low lung volumes with bronchovascular crowding. Unchanged heart size
and mediastinal contours allowing for differences in technique. Mild
peribronchial thickening. Subsegmental right basilar atelectasis. No
focal consolidation. No pleural effusion or pneumothorax.
IMPRESSION: Low lung volumes with bronchovascular crowding and mild bronchial
thickening. Subsegmental right basilar atelectasis.

## 2019-05-04 IMAGING — DX DG TIBIA/FIBULA PORT 2V*L*
2 series · 2 of 2 positions shown · non-contrast
Comparison: None.

CLINICAL DATA: Chronic left leg pain

EXAM:
PORTABLE LEFT TIBIA AND FIBULA - 2 VIEW

[tibia ap]
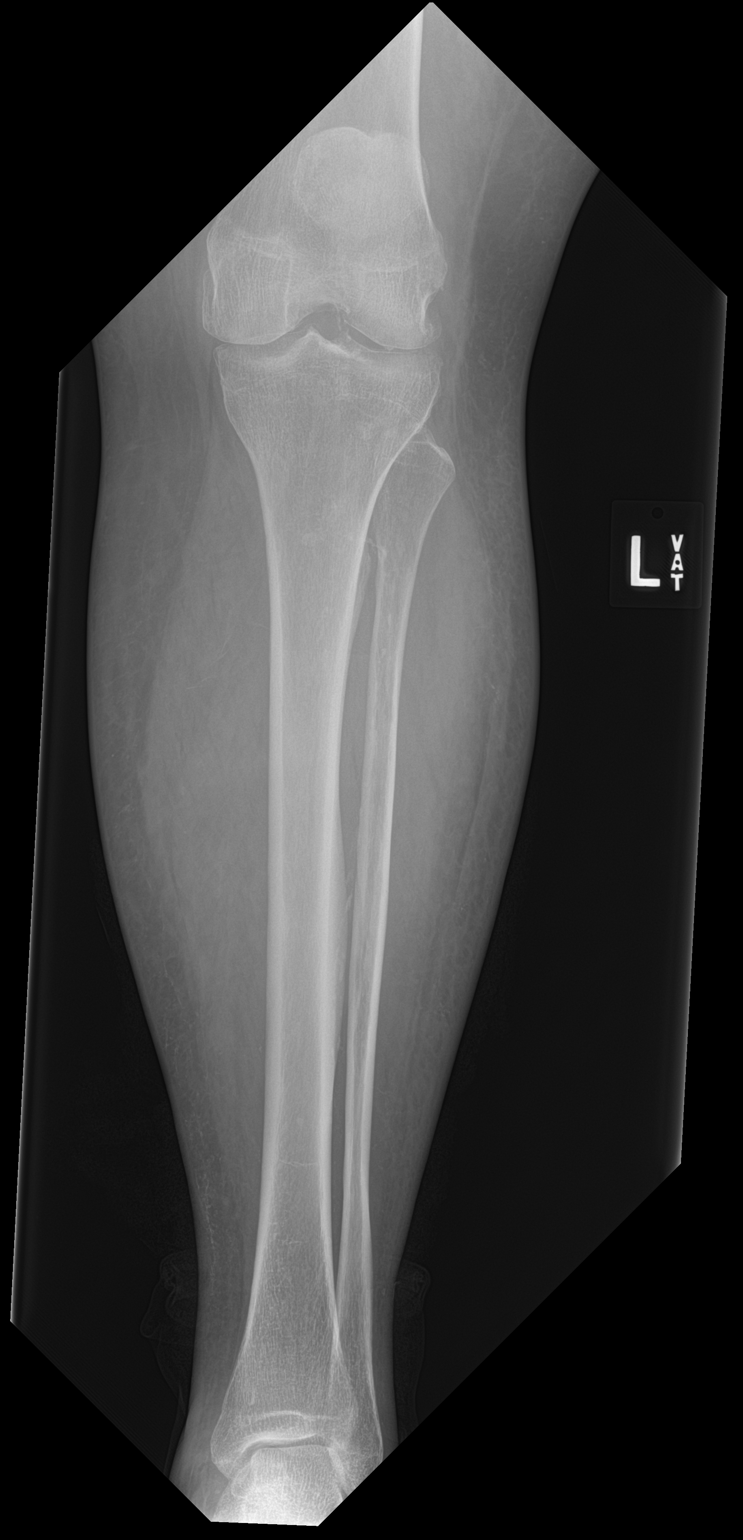

[tibia lat]
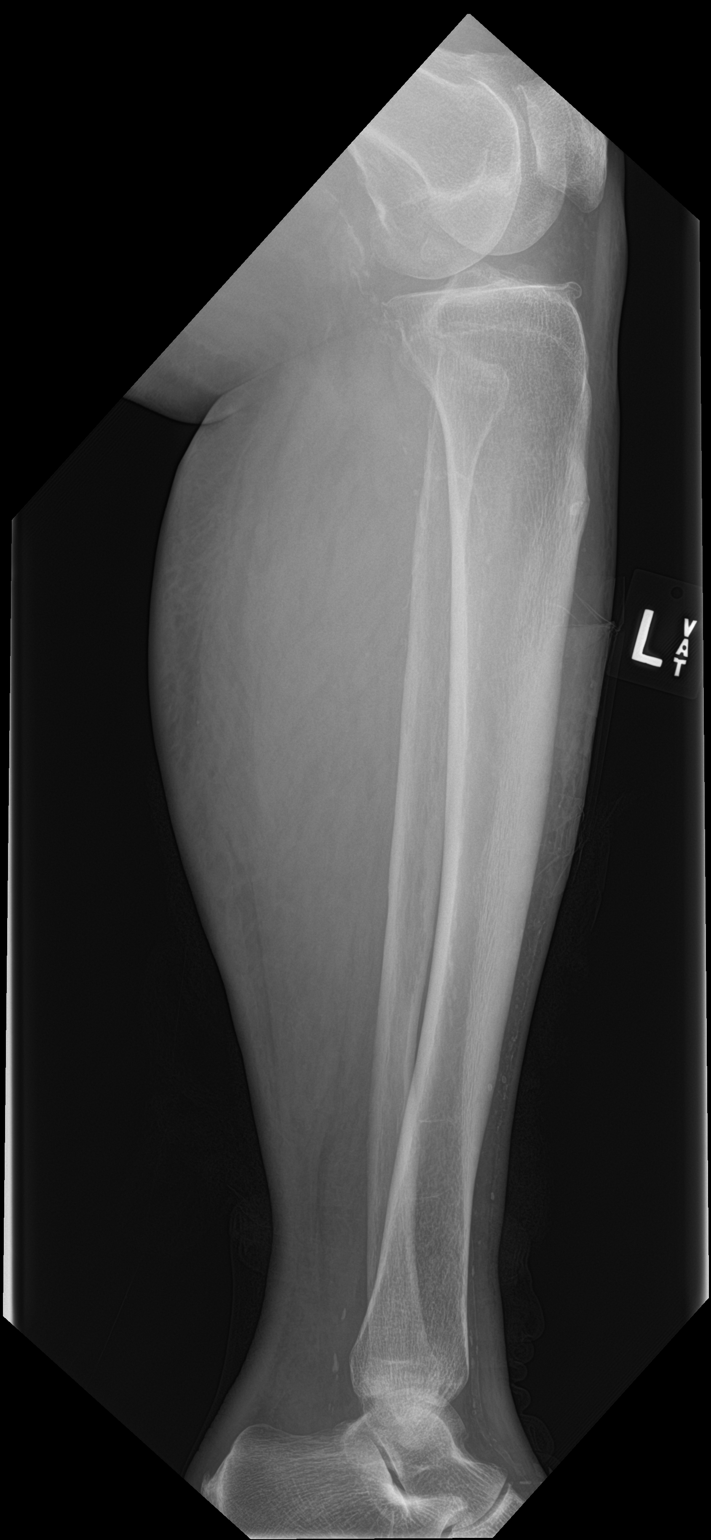

[2 of 2 positions shown; findings below may reference images not displayed]

FINDINGS: No acute bony abnormality. Specifically, no fracture, subluxation,
or dislocation. Soft tissues intact. Mild degenerative changes in
the left knee.
IMPRESSION: No acute bony abnormality.

## 2019-05-20 ENCOUNTER — Telehealth (HOSPITAL_COMMUNITY): Payer: Self-pay | Admitting: Licensed Clinical Social Worker

## 2019-05-20 NOTE — Telephone Encounter (Signed)
Pt enrolled in [employer] Me[employer] 3 month study through [facility]- patient's 3 month period has now ended so CSW spoke with pt to complete an exit interview regarding patient's experience:  1. Since being on Moms Meals have you noticed any changes in your health or the way you feel physically? no Weight loss? no Improvement in BP? no  2. What do you like about the program? Liked the way most the meals taste and that it was convenient. What would you like to change/add? No changes.  3. How did you get food prior to [place]? a. Did you get food stamps? $25/month b. Did someone help financially? no c. Did you access food banks? yes d. Do you anticipate any issues transitioning back to getting your own food after this program ends? no  4. Would you be interested in future food pilots? yes  5. Would you like a dietary consult? yes  6. Do you have internet access? Tablet/Smart Phone? Smart phone   Jorge Ny, LCSW Clinical Social Worker [facility] Desk#: [phone] Cell#: [phone]

## 2019-05-30 ENCOUNTER — Other Ambulatory Visit: Payer: Self-pay

## 2019-05-30 ENCOUNTER — Ambulatory Visit (HOSPITAL_BASED_OUTPATIENT_CLINIC_OR_DEPARTMENT_OTHER)
Admission: RE | Admit: 2019-05-30 | Discharge: 2019-05-30 | Disposition: A | Discharge status: Home / Self Care | Payer: Medicare Other | Source: Ambulatory Visit | Attending: Cardiovascular Disease | Admitting: Cardiovascular Disease

## 2019-05-30 ENCOUNTER — Telehealth: Payer: Self-pay | Admitting: Cardiology

## 2019-05-30 ENCOUNTER — Ambulatory Visit (HOSPITAL_COMMUNITY)
Admission: RE | Admit: 2019-05-30 | Discharge: 2019-05-30 | Disposition: A | Discharge status: Home / Self Care | Payer: Medicare Other | Source: Ambulatory Visit | Attending: Cardiology | Admitting: Cardiology

## 2019-05-30 ENCOUNTER — Other Ambulatory Visit: Payer: Self-pay | Admitting: Cardiovascular Disease

## 2019-05-30 DIAGNOSIS — I6521 Occlusion and stenosis of right carotid artery: Secondary | ICD-10-CM

## 2019-05-30 DIAGNOSIS — I739 Peripheral vascular disease, unspecified: Secondary | ICD-10-CM | POA: Insufficient documentation

## 2019-05-30 NOTE — Telephone Encounter (Signed)
LVM for patient to schedule an ov with Lurena Joiner in August.

## 2019-06-07 ENCOUNTER — Ambulatory Visit: Payer: Medicare Other

## 2019-06-14 IMAGING — CT CT HEAD W/O CM
4 series · 17 of 47 positions shown, 19 images · non-contrast
Comparison: 08/11/2016

CLINICAL DATA: Fall, head trauma.  On anticoagulation.

EXAM:
CT HEAD WITHOUT CONTRAST
TECHNIQUE: Contiguous axial images were obtained from the base of the skull
through the vertex without intravenous contrast.

[Series 3: head wo · axial · 0.43mm/px · z∈[-156,-32]mm · 7 of 35 slices shown, 9 images]
[im 5/35  brain]
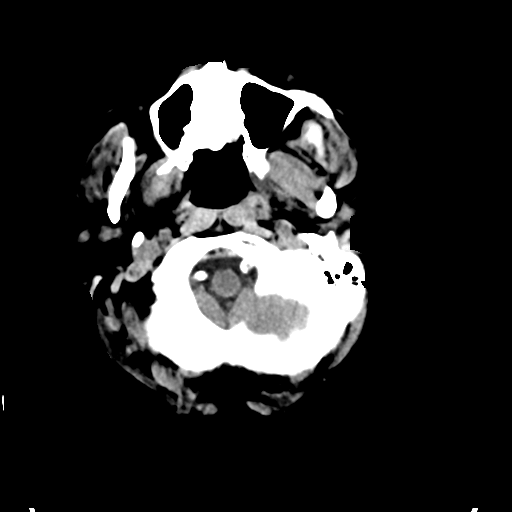
[im 5/35  bone]
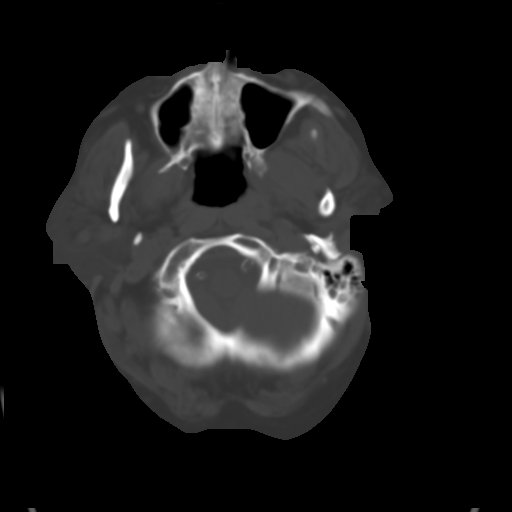
[im 9/35  brain]
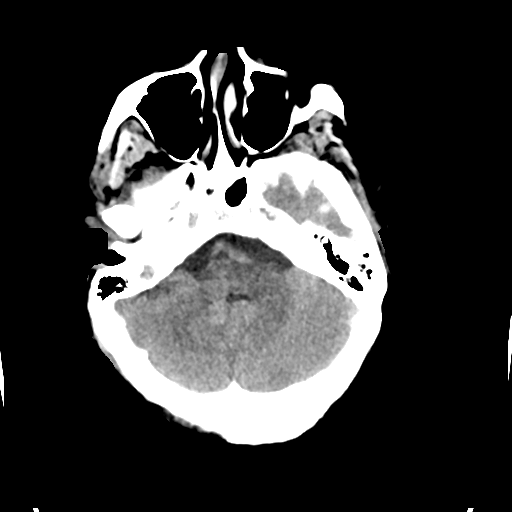
[im 13/35  brain]
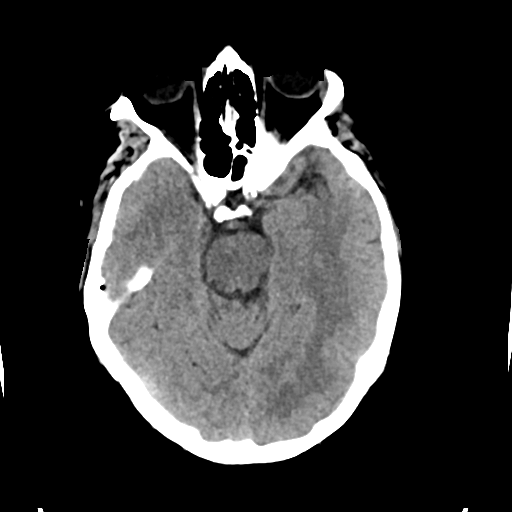
[im 18/35  brain]
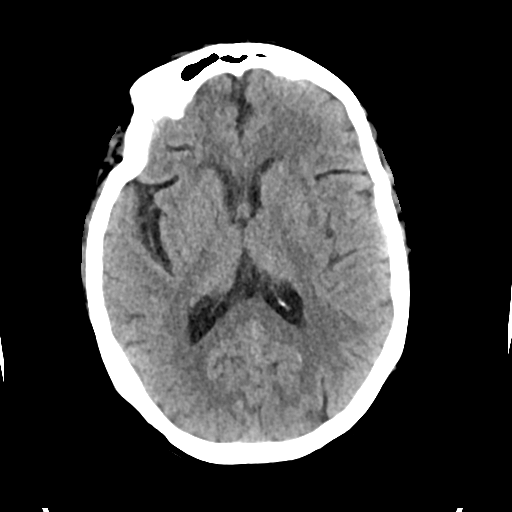
[im 22/35  brain]
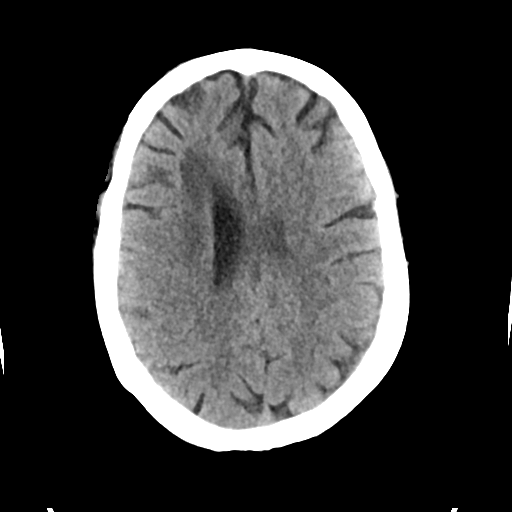
[im 22/35  bone]
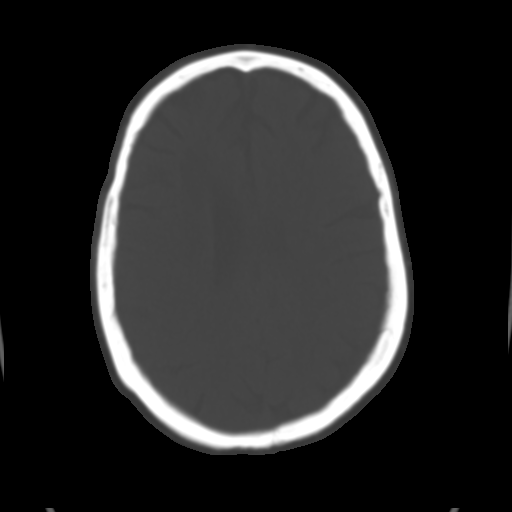
[im 26/35  brain]
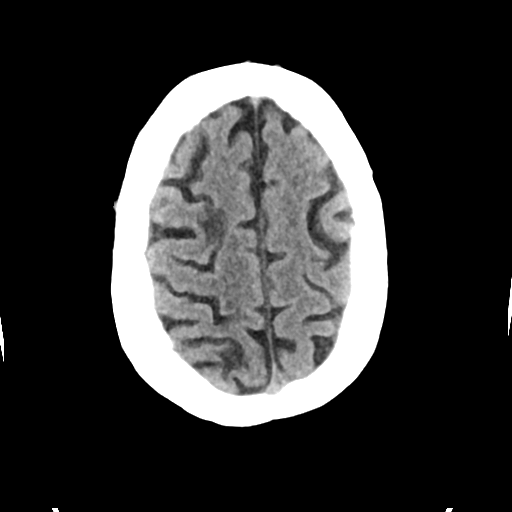
[im 30/35  brain]
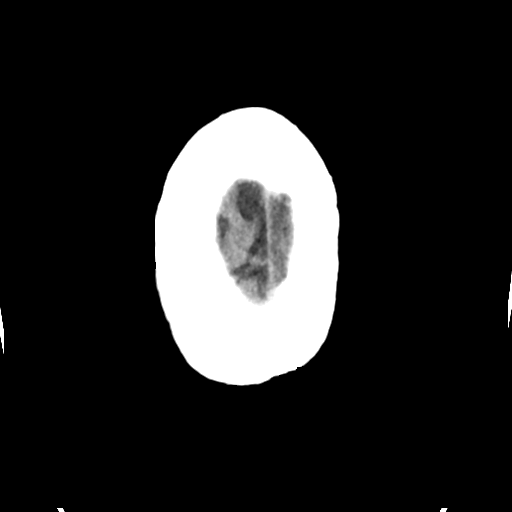

[Series 4: head bone · axial · 0.43mm/px · z∈[-160,-98]mm · 4 of 88 slices shown]
[im 9/88  bone]
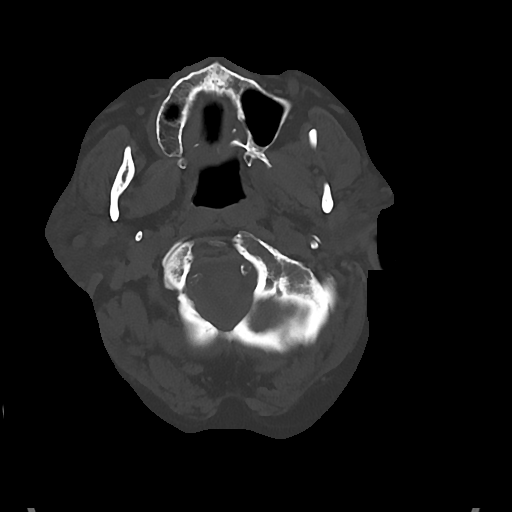
[im 18/88  bone]
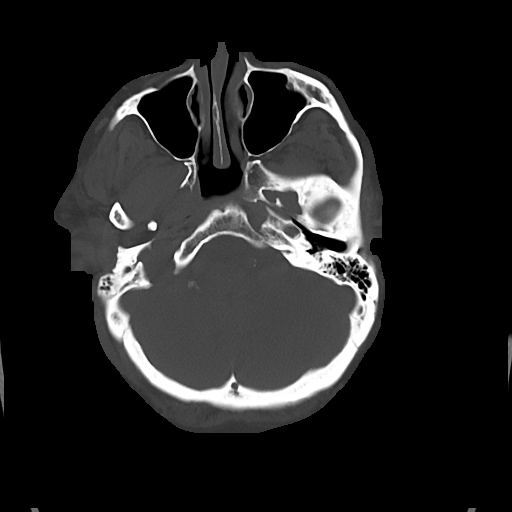
[im 27/88  bone]
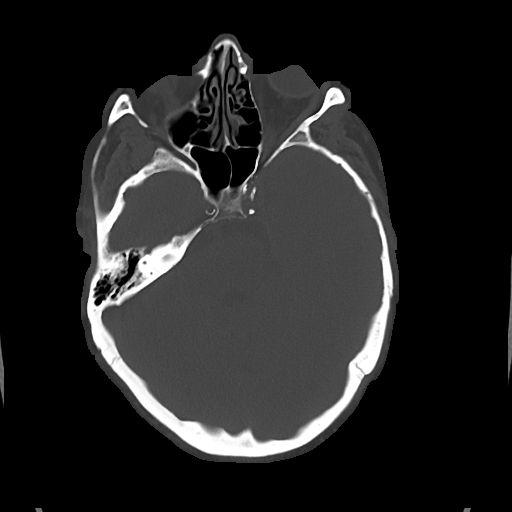
[im 40/88  bone]
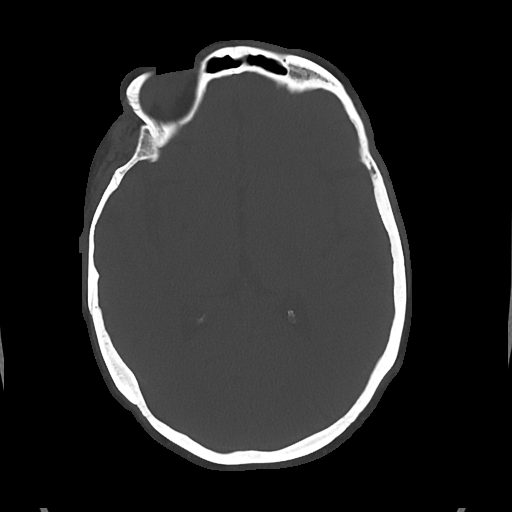

[Series 5: cor soft · coronal · 0.32mm/px · 3 of 66 slices shown]
[im 22/66  brain]
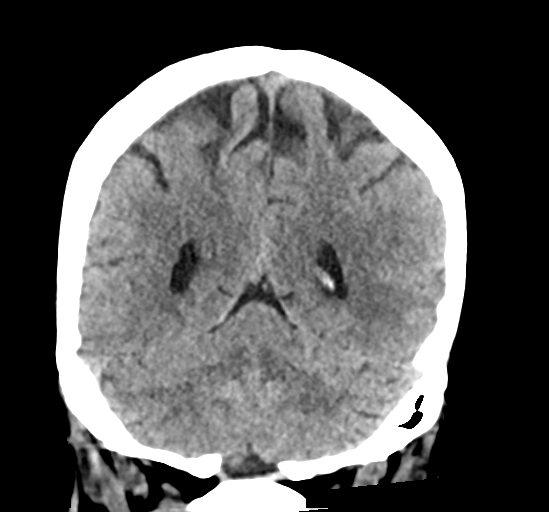
[im 29/66  brain]
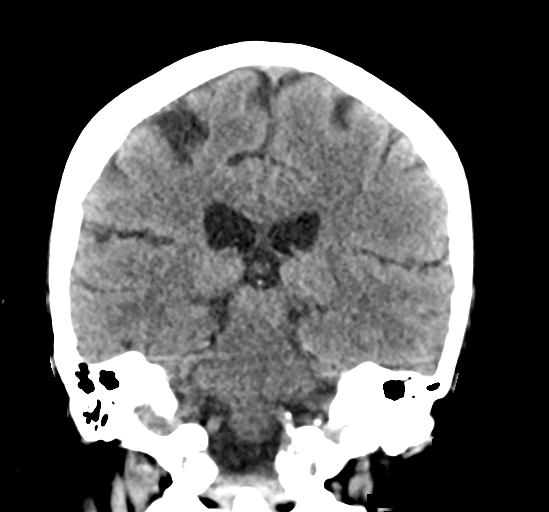
[im 37/66  brain]
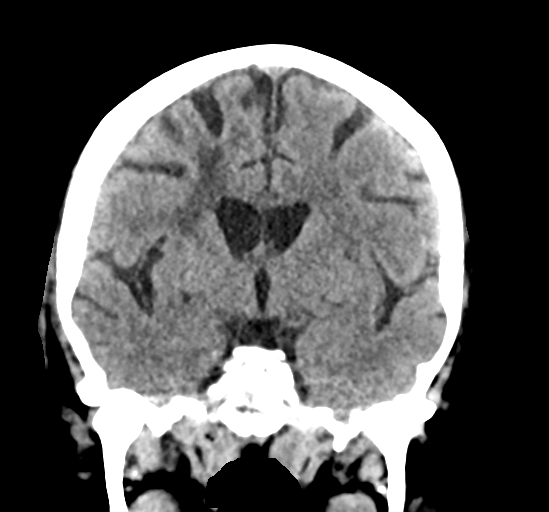

[Series 6: sag soft · sagittal · 0.34mm/px · 3 of 58 slices shown]
[im 20/58  brain]
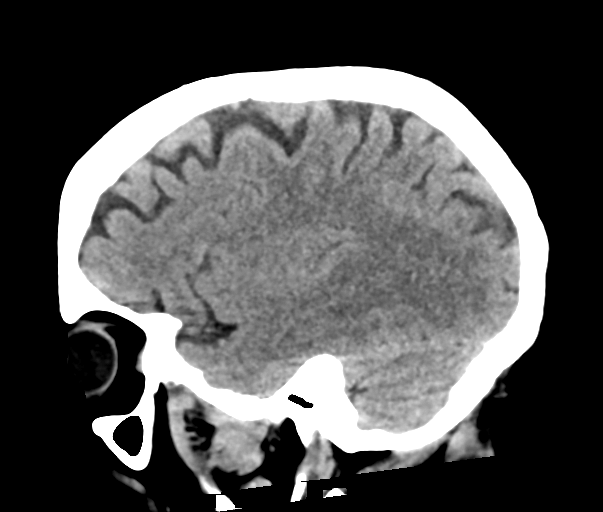
[im 29/58  brain]
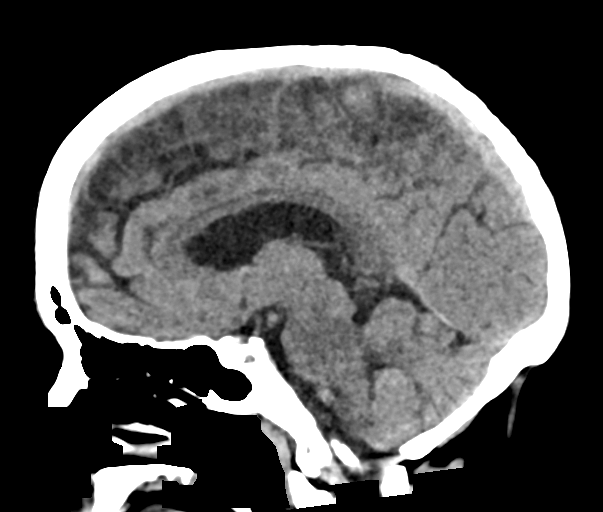
[im 39/58  brain]
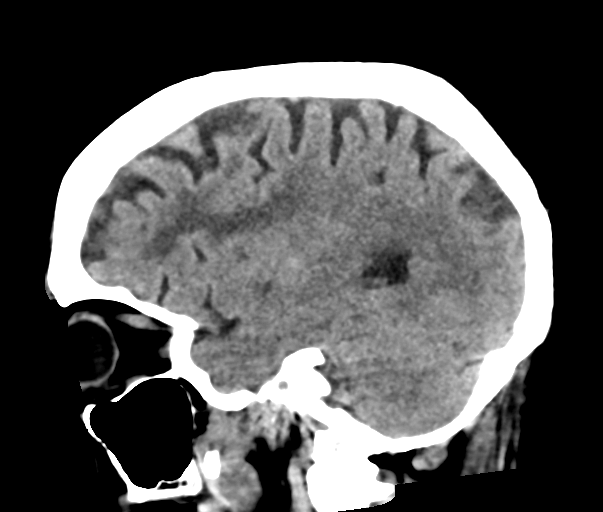

[17 of 47 positions shown; findings below may reference images not displayed]

FINDINGS: Brain: Old infarcts in the right frontal and posterior parietal
lobes, stable. No acute intracranial abnormality. Specifically, no
hemorrhage, hydrocephalus, mass lesion, acute infarction, or
significant intracranial injury.

Vascular: No hyperdense vessel or unexpected calcification.

Skull: No acute calvarial abnormality.

Sinuses/Orbits: Visualized paranasal sinuses and mastoids clear.
Orbital soft tissues unremarkable.

Other: None
IMPRESSION: Stable old right infarcts.  No acute intracranial abnormality.

## 2019-06-14 IMAGING — CR DG FOOT COMPLETE 3+V*L*
3 series · 3 of 3 positions shown · non-contrast
Comparison: None.

CLINICAL DATA: Fall.  Left foot pain.

EXAM:
LEFT FOOT - COMPLETE 3+ VIEW

[foot ap]
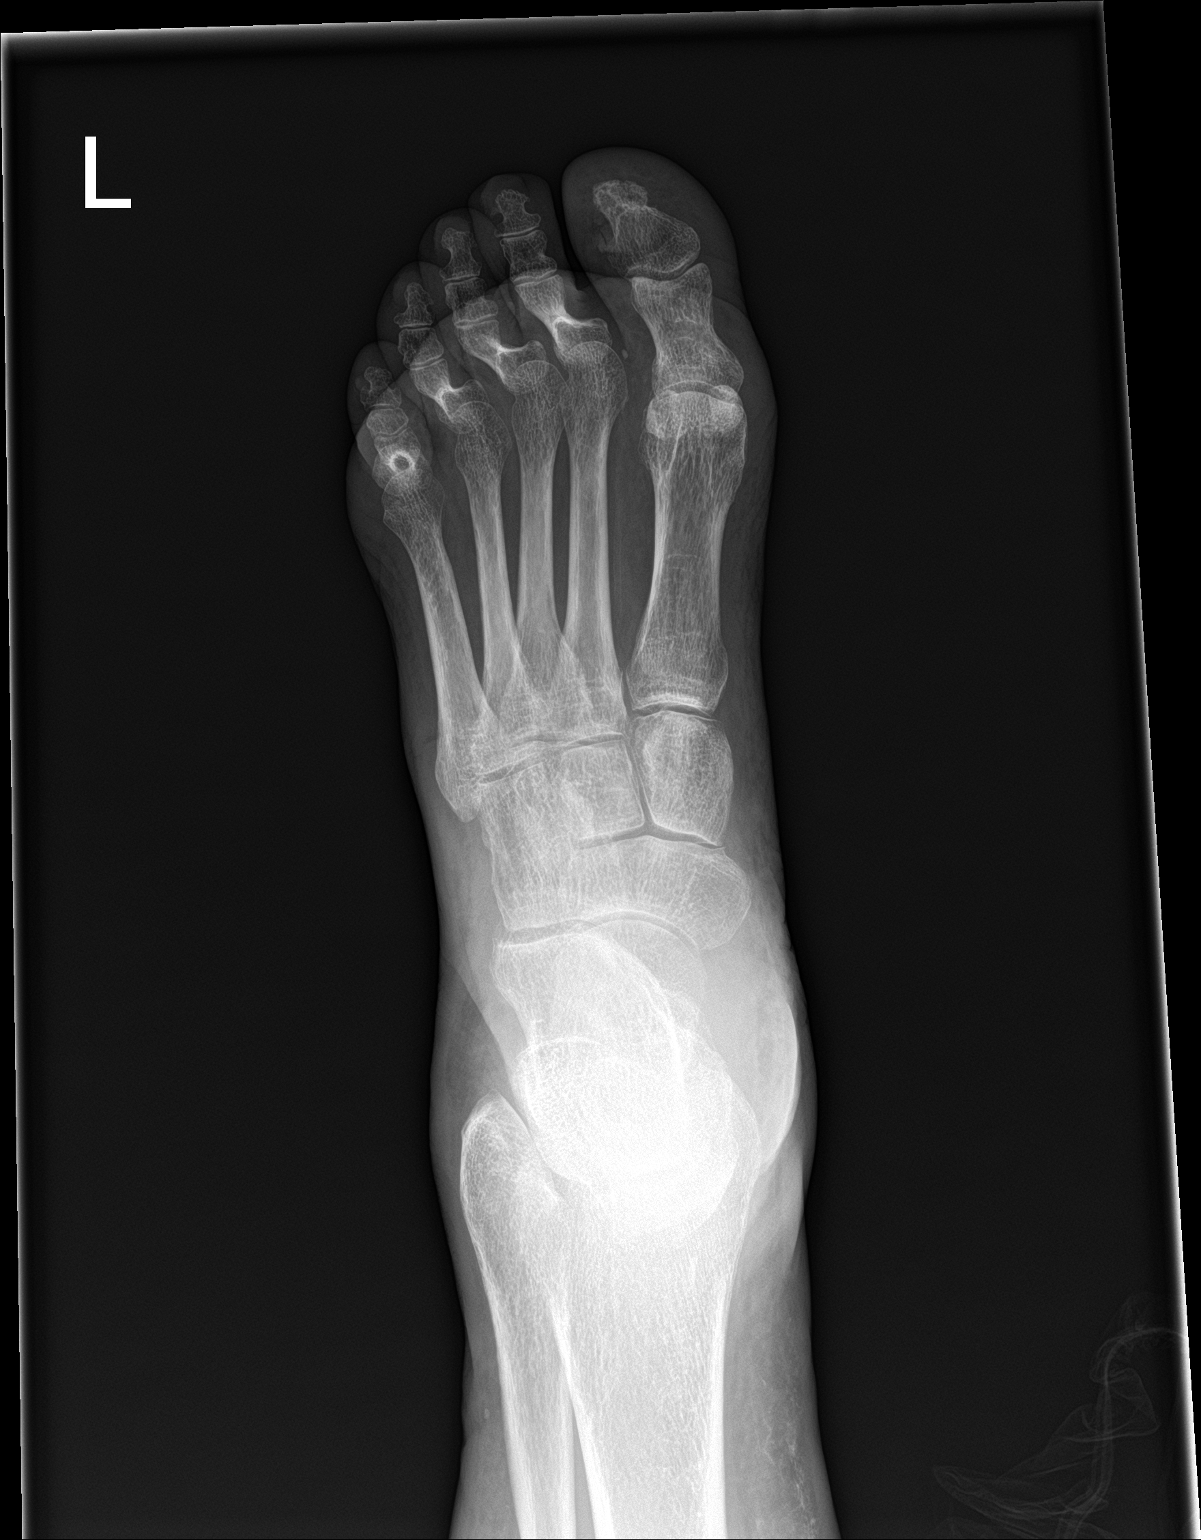

[foot obl]
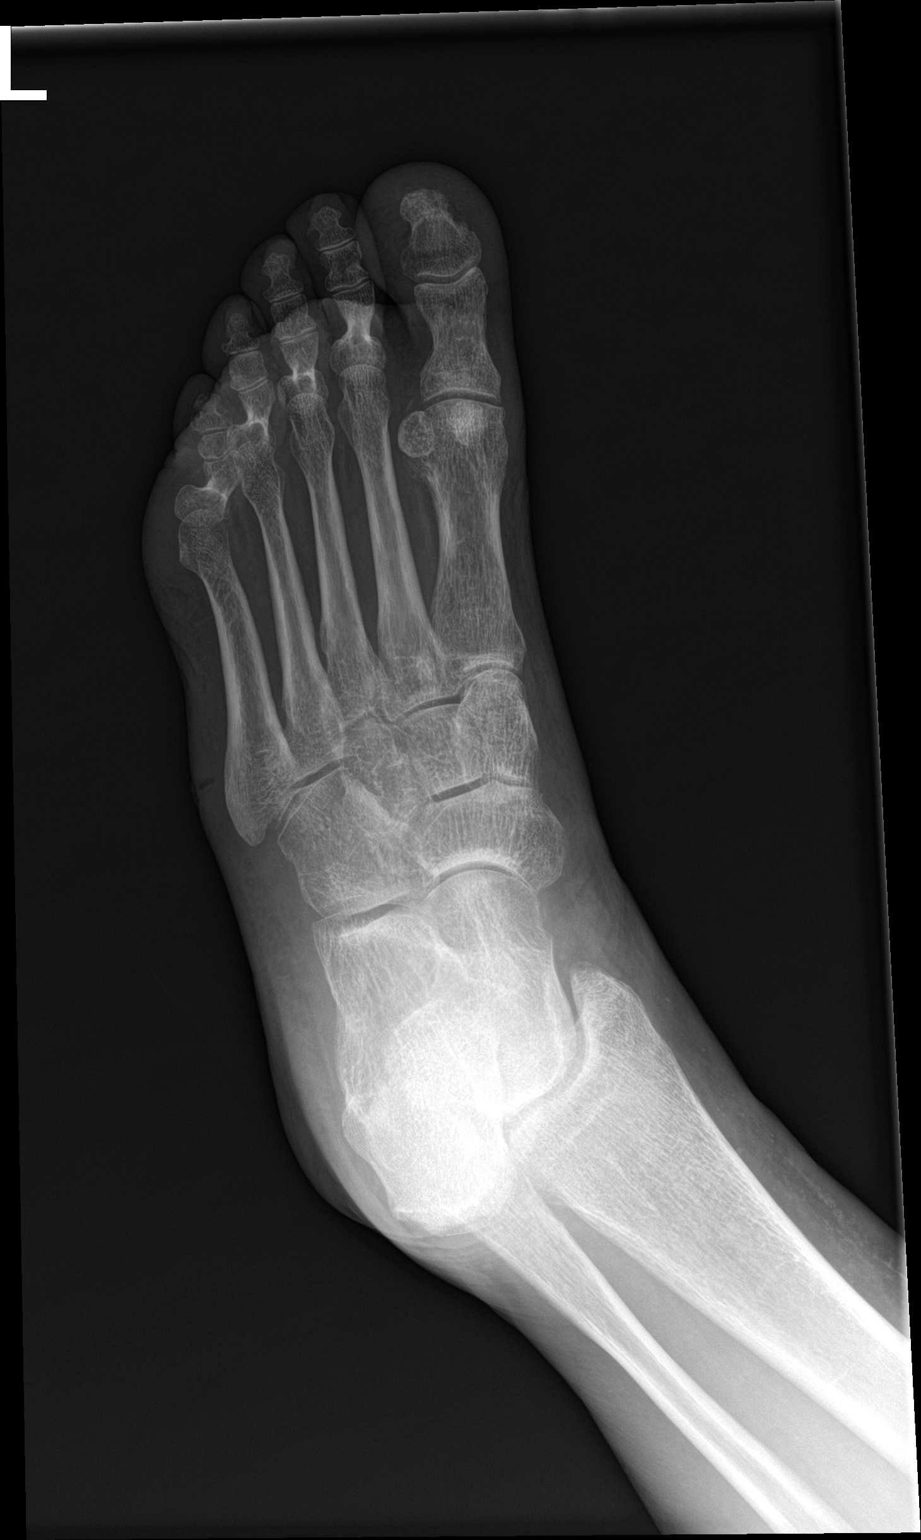

[foot lat]
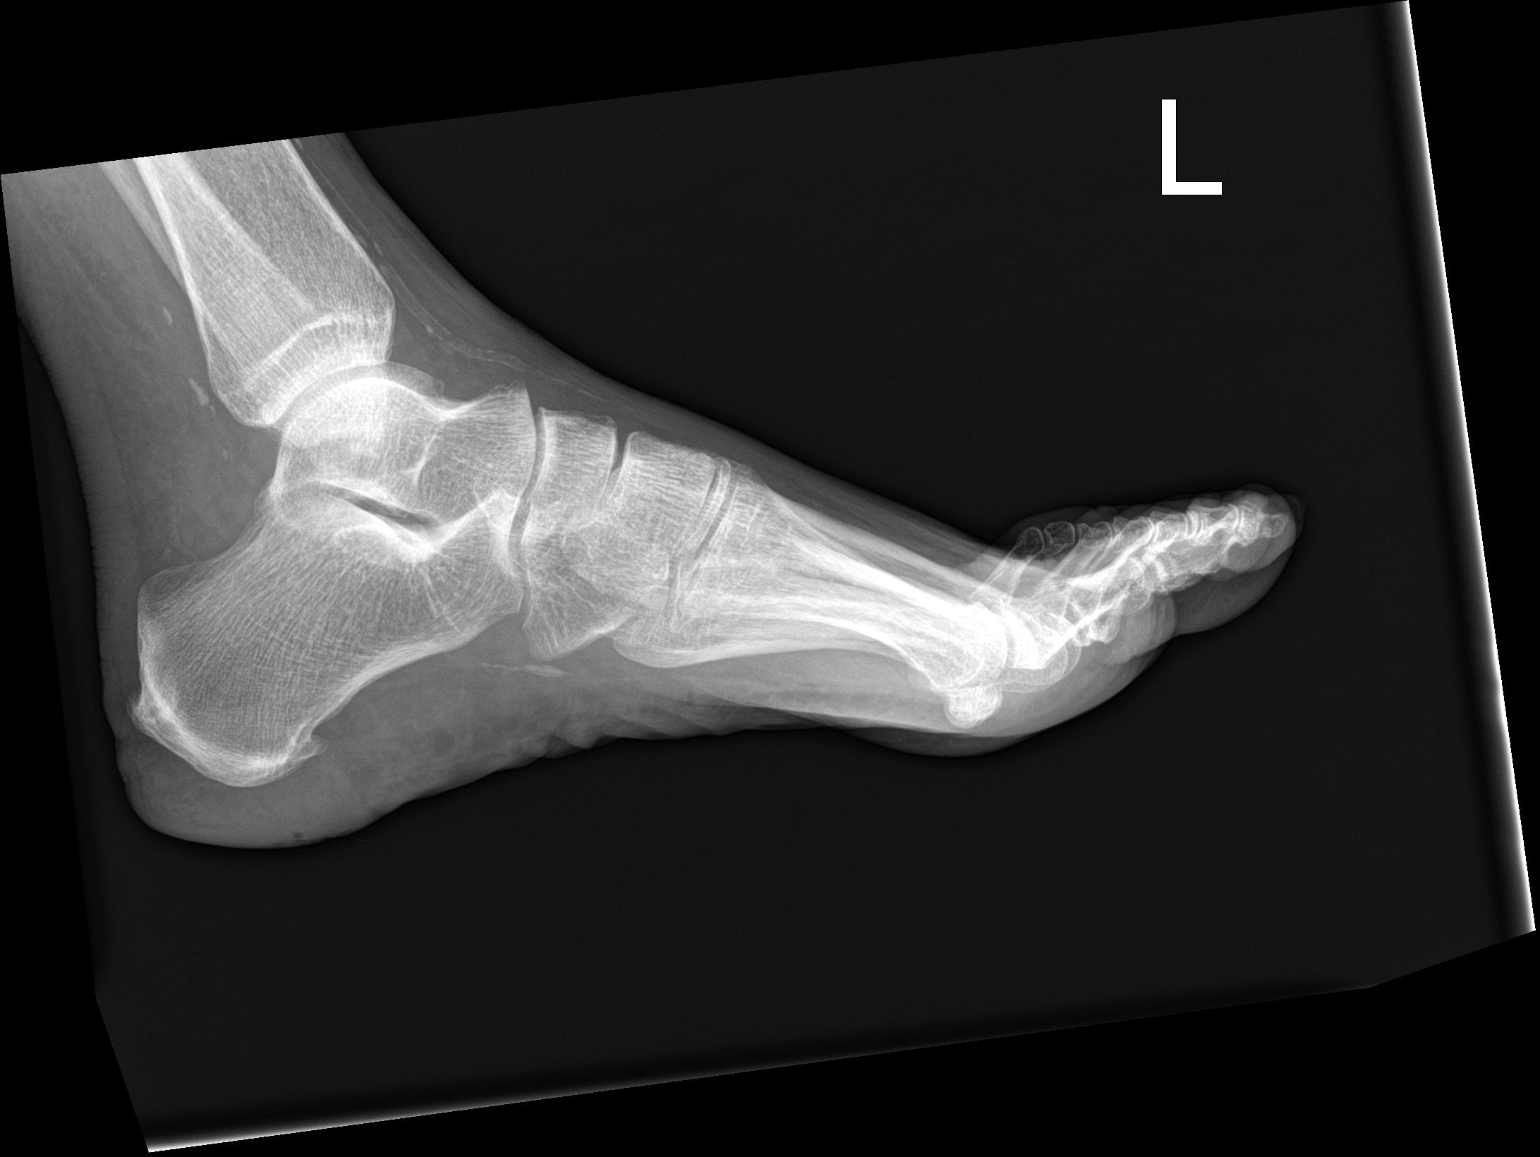

[3 of 3 positions shown; findings below may reference images not displayed]

FINDINGS: No acute bony abnormality. Specifically, no fracture, subluxation,
or dislocation. Vascular calcifications noted.
IMPRESSION: No acute bony abnormality.

## 2019-06-14 IMAGING — CR DG TIBIA/FIBULA 2V*L*
4 series · 4 of 4 positions shown · non-contrast
Comparison: 08/19/2018

CLINICAL DATA: Fall, leg pain

EXAM:
LEFT TIBIA AND FIBULA - 2 VIEW

[tibia ap (1 of 2)]
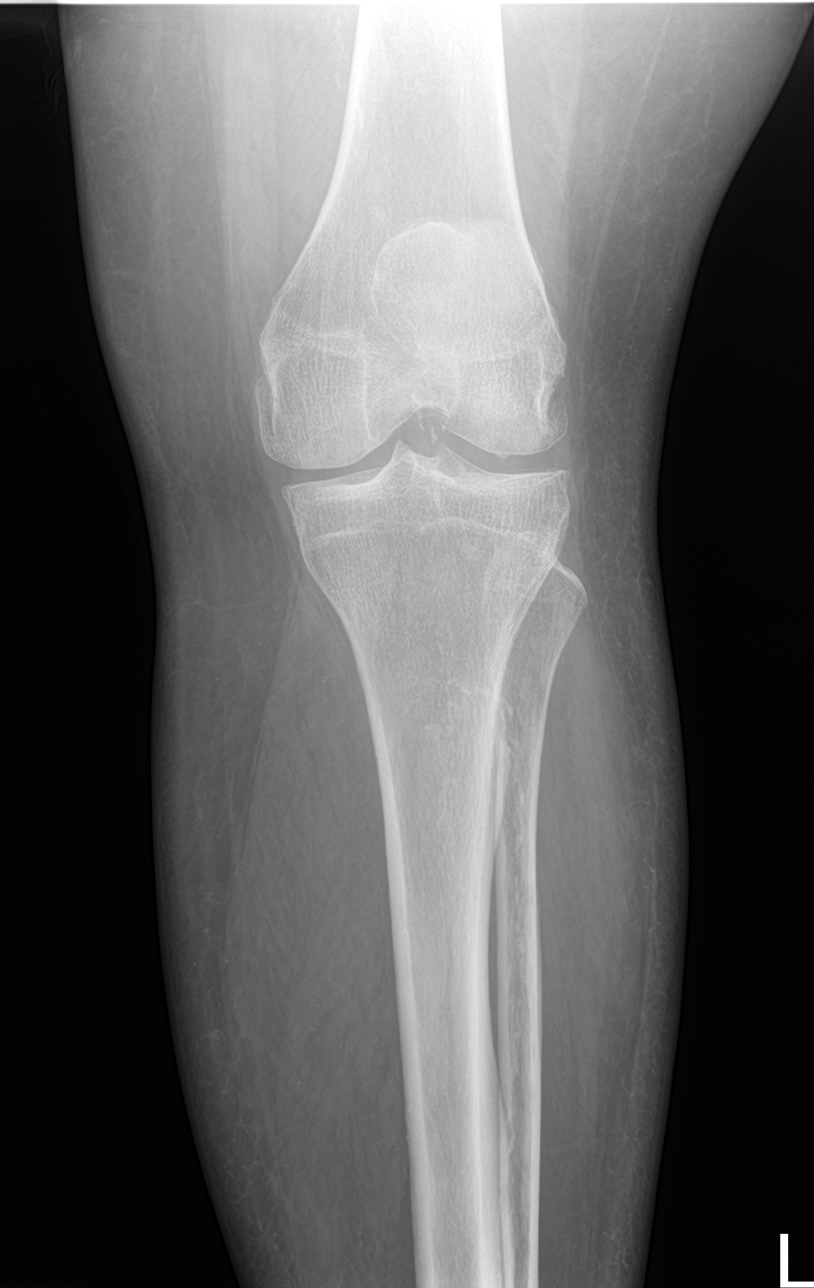

[tibia ap (2 of 2)]
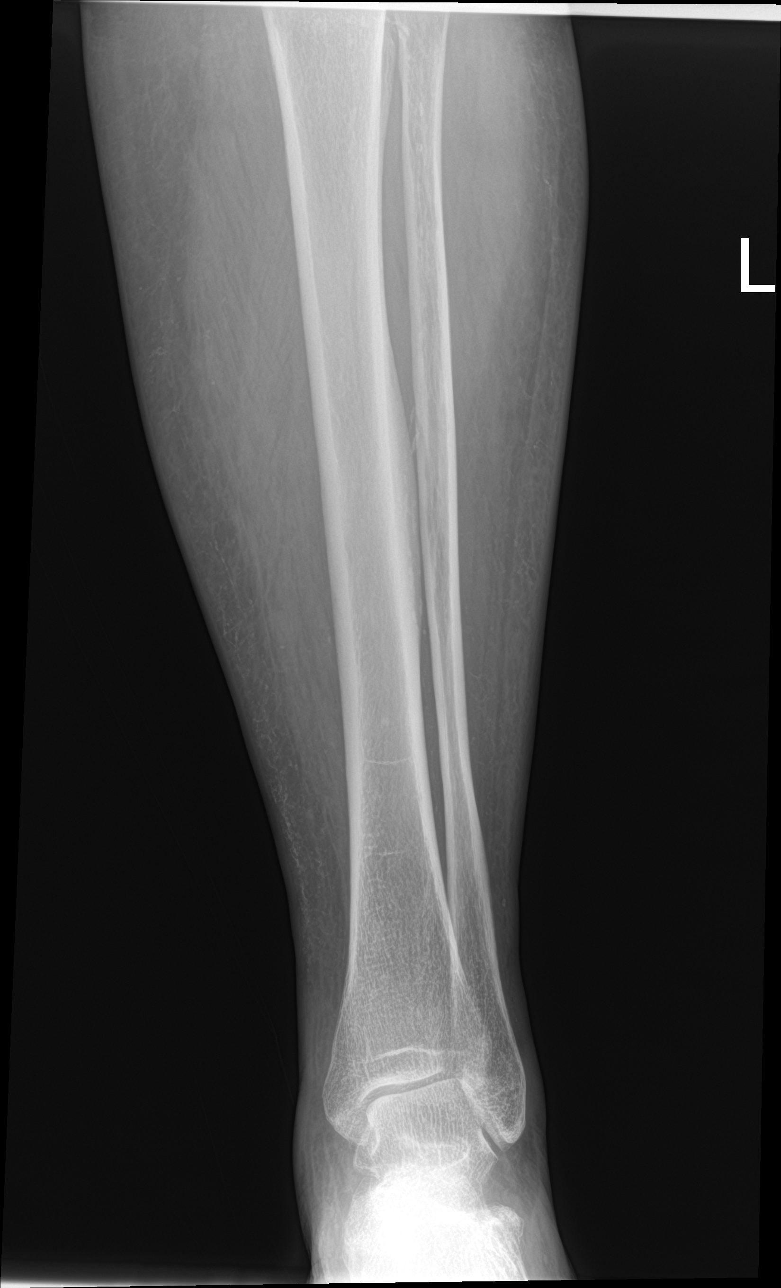

[tibia lat (1 of 2)]
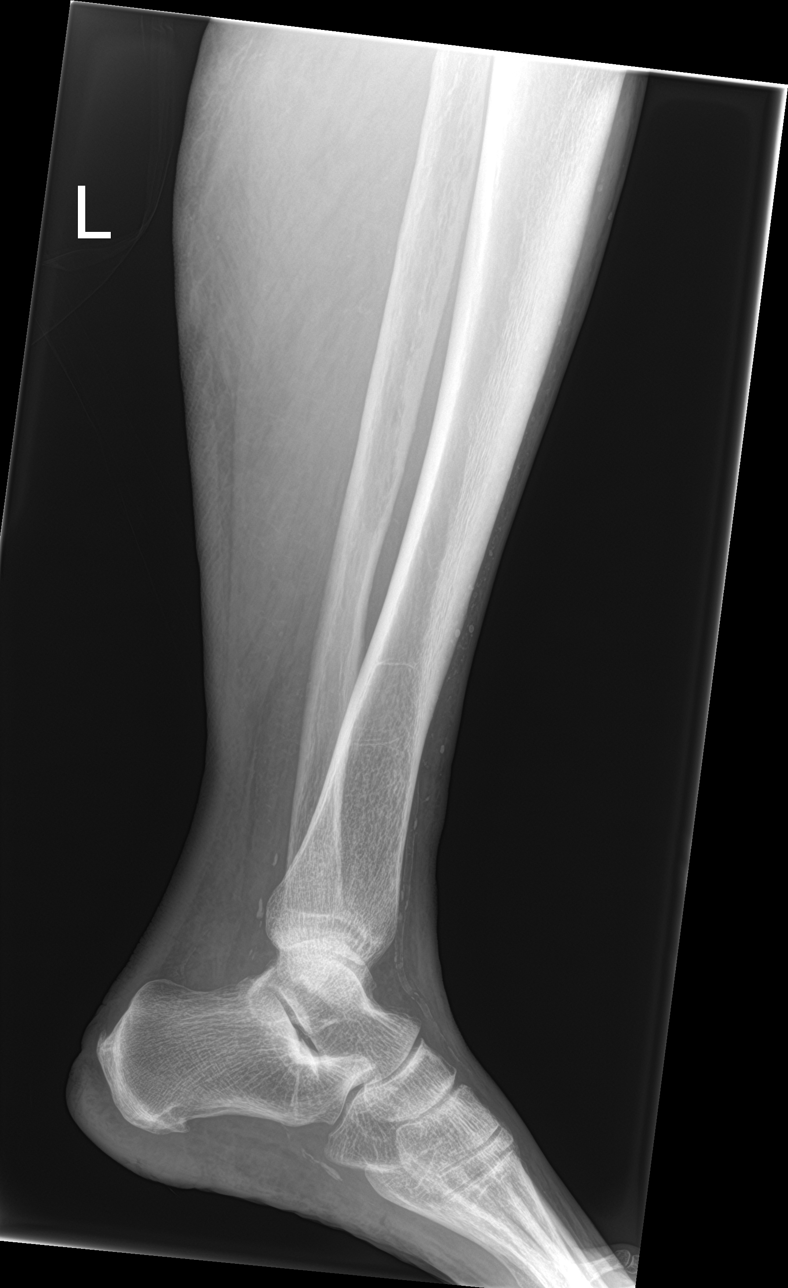

[tibia lat (2 of 2)]
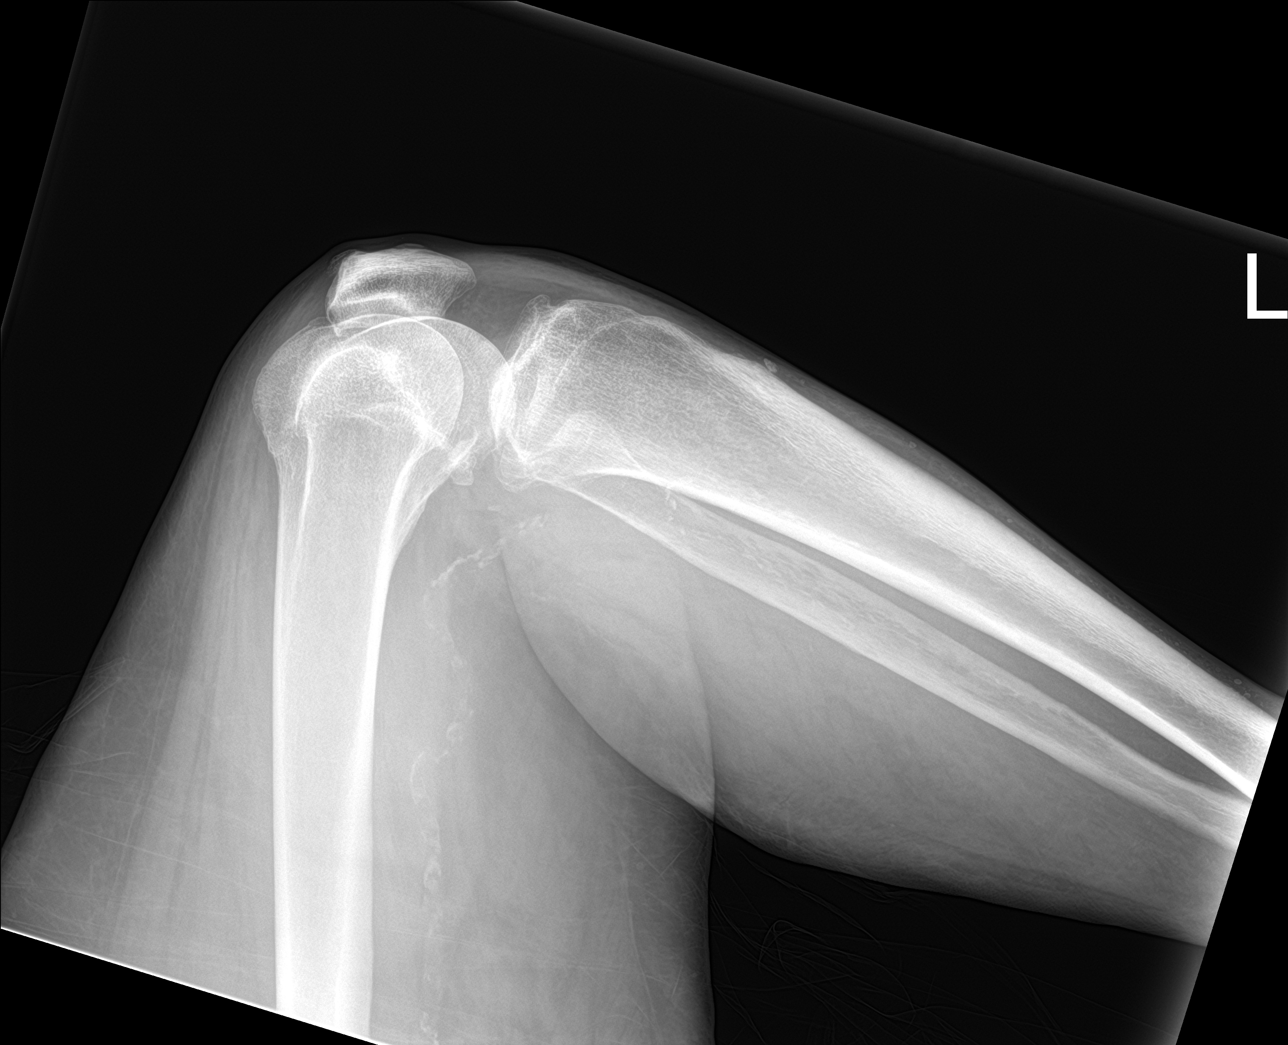

[4 of 4 positions shown; findings below may reference images not displayed]

FINDINGS: No acute bony abnormality. Specifically, no fracture, subluxation,
or dislocation. Vascular calcifications. Mild degenerative changes
in the left knee.
IMPRESSION: No acute bony abnormality.

## 2019-06-17 ENCOUNTER — Ambulatory Visit: Payer: Medicare Other | Admitting: Cardiology

## 2019-06-20 ENCOUNTER — Telehealth: Payer: Self-pay | Admitting: *Deleted

## 2019-06-20 ENCOUNTER — Ambulatory Visit (INDEPENDENT_AMBULATORY_CARE_PROVIDER_SITE_OTHER): Payer: Medicare Other | Admitting: Cardiology

## 2019-06-20 ENCOUNTER — Other Ambulatory Visit: Payer: Self-pay

## 2019-06-20 VITALS — BP 115/54 | HR 62 | Ht 64.0 in | Wt 227.0 lb

## 2019-06-20 DIAGNOSIS — Z7901 Long term (current) use of anticoagulants: Secondary | ICD-10-CM

## 2019-06-20 DIAGNOSIS — Z9862 Peripheral vascular angioplasty status: Secondary | ICD-10-CM

## 2019-06-20 DIAGNOSIS — S91105A Unspecified open wound of left lesser toe(s) without damage to nail, initial encounter: Secondary | ICD-10-CM

## 2019-06-20 DIAGNOSIS — E1151 Type 2 diabetes mellitus with diabetic peripheral angiopathy without gangrene: Secondary | ICD-10-CM | POA: Diagnosis not present

## 2019-06-20 DIAGNOSIS — I480 Paroxysmal atrial fibrillation: Secondary | ICD-10-CM

## 2019-06-20 DIAGNOSIS — J449 Chronic obstructive pulmonary disease, unspecified: Secondary | ICD-10-CM | POA: Diagnosis not present

## 2019-06-20 DIAGNOSIS — N183 Chronic kidney disease, stage 3 unspecified: Secondary | ICD-10-CM

## 2019-06-20 DIAGNOSIS — I10 Essential (primary) hypertension: Secondary | ICD-10-CM

## 2019-06-20 DIAGNOSIS — I5042 Chronic combined systolic (congestive) and diastolic (congestive) heart failure: Secondary | ICD-10-CM | POA: Diagnosis not present

## 2019-06-20 DIAGNOSIS — Z89431 Acquired absence of right foot: Secondary | ICD-10-CM

## 2019-06-20 DIAGNOSIS — I739 Peripheral vascular disease, unspecified: Secondary | ICD-10-CM

## 2019-06-20 DIAGNOSIS — L98499 Non-pressure chronic ulcer of skin of other sites with unspecified severity: Secondary | ICD-10-CM

## 2019-06-20 DIAGNOSIS — I428 Other cardiomyopathies: Secondary | ICD-10-CM

## 2019-06-20 LAB — CBC WITH DIFFERENTIAL/PLATELET
Basophils Absolute: 0 10*3/uL (ref 0.0–0.2)
Basos: 0 %
EOS (ABSOLUTE): 0.3 10*3/uL (ref 0.0–0.4)
Eos: 2 %
Hematocrit: 30.3 % — ABNORMAL LOW (ref 34.0–46.6)
Hemoglobin: 9.7 g/dL — ABNORMAL LOW (ref 11.1–15.9)
Immature Grans (Abs): 0 10*3/uL (ref 0.0–0.1)
Immature Granulocytes: 0 %
Lymphocytes Absolute: 1.6 10*3/uL (ref 0.7–3.1)
Lymphs: 13 %
MCH: 29.8 pg (ref 26.6–33.0)
MCHC: 32 g/dL (ref 31.5–35.7)
MCV: 93 fL (ref 79–97)
Monocytes Absolute: 1 10*3/uL — ABNORMAL HIGH (ref 0.1–0.9)
Monocytes: 8 %
Neutrophils Absolute: 9.5 10*3/uL — ABNORMAL HIGH (ref 1.4–7.0)
Neutrophils: 77 %
Platelets: 342 10*3/uL (ref 150–450)
RBC: 3.26 x10E6/uL — ABNORMAL LOW (ref 3.77–5.28)
RDW: 12.7 % (ref 11.7–15.4)
WBC: 12.4 10*3/uL — ABNORMAL HIGH (ref 3.4–10.8)

## 2019-06-20 LAB — BASIC METABOLIC PANEL
BUN/Creatinine Ratio: 41 — ABNORMAL HIGH (ref 9–23)
BUN: 90 mg/dL (ref 6–24)
CO2: 17 mmol/L — ABNORMAL LOW (ref 20–29)
Calcium: 9.3 mg/dL (ref 8.7–10.2)
Chloride: 104 mmol/L (ref 96–106)
Creatinine, Ser: 2.18 mg/dL — ABNORMAL HIGH (ref 0.57–1.00)
GFR calc Af Amer: 28 mL/min/{1.73_m2} — ABNORMAL LOW (ref >59)
GFR calc non Af Amer: 25 mL/min/{1.73_m2} — ABNORMAL LOW (ref >59)
Glucose: 90 mg/dL (ref 65–99)
Potassium: 5.9 mmol/L (ref 3.5–5.2)
Sodium: 139 mmol/L (ref 134–144)

## 2019-06-20 NOTE — Assessment & Plan Note (Signed)
Pt has chronic LE neuropathy unable to tolerate Neurontin or Lyrica.

## 2019-06-20 NOTE — Telephone Encounter (Signed)
[facility] called with critical results of potassium of 5.9 and BUN 90. Will forward to [employer] pa whop saw the patient today.

## 2019-06-20 NOTE — Telephone Encounter (Signed)
-----   Message from Erlene Quan, [place] sent at 06/20/2019  4:22 PM EDT ----- Disregard previous message- She should probably go to the ED for STAT repeat BMP- She may need admission.  She needs to stop her Aldactone, K+ dur, Zaroxolyn, and Cozaar.  She should decrease her Demadex to 20 mg daily.  Kerin Ransom PA-C 06/20/2019 4:26 PM

## 2019-06-20 NOTE — Progress Notes (Signed)
Cardiology Office Note:    Date:  06/20/2019   ID:  ROBBYE Price, DOB 07/09/1961, MRN [id number]  PCP:  Tsosie Billing, MD (Inactive)  Cardiologist:  Quay Burow, MD  Electrophysiologist:  None   Referring MD: No ref. provider found   Lt 5 th toe ulcer  History of Present Illness:    [name] is a 58 y.o. female with a hx of multiple medical problems.  She has a history of NICM and CHF.  She is been followed in the heart failure clinic in the past.  Her last ejection fraction was 45% in December 2019.  She has PAF and is on Eliquis and Amiodarone.  She has hypertensive cardiovascular disease, last echo Dec 2019 showed her EF to be 45-50% with mild LVH and grade 2 DD.  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  She was unable to tolerate Neurontin and Lyrica.  She also has extensive vascular disease and is been followed by Dr. Gwenlyn Found.  She had a right SFA intervention in 2014, a left carotid endarterectomy in April 2016 had a redo right SFA intervention in May 2018.  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  She was admitted 10/5 through 08/19/2018 with alcohol intoxication after she passed out in the parking lot. She was discharged the next day.  She was admitted 11/26/18 with marked volume overloadin the setting of noncompliance with medications and diet.She was diuresed with IV lasix and transitioned to torsemide 40 mg twice a day. Her hospital course was complicated by AKI. Discharge weight was 236 pounds. She was seen in the CHF clinic in follow up 12/18/2018.   Most recently she was in the ED with "multiple complaints", mainly leg pain which is felt to be secondary to neuropathy.  Her SCr was elevated for her- 2.10.  She was also hypoglycemic and her insulin was adjusted.   She is in the office today with complaints of non healing Lt 5 th toe ulcer x two weeks.  She was also to discuss her LEA dopplers done July 2020 which sowed  progression of her PVD.  She has done well from a cardiac standpoint, now following a diet and lost 6 lbs.  She is still smoking.    Past Medical History:  Diagnosis Date  . Alcohol abuse   . Alcoholic cirrhosis ([place])   . Anemia   . Anxiety   . Arthritis    "knees" (11/26/2018)  . B12 deficiency   . CAD (coronary artery disease)    a. cath 11/10/2014 LM nl, LAD min irregs, D1 30 ost, D2 50d, LCX 78m, OM1 80 p/m (1.5 mm vessel), OM2 56m, RCA nondom 76m-->med rx.. Demand ischemia in the setting of rapid a-fib.  . Cardiomyopathy ([place])   . Carotid artery disease ([place])    a. 01/2015 Carotid Angio: RICA 123XX123, LICA [id number]. s/p L carotid endarterectomy 02/2015.  . Cellulitis    lower extremities  . Chronic combined systolic and diastolic CHF (congestive heart failure) ([place])    a. Last echo 12/205: EF 40-45%, inferoapical/posterior HK, not technically sufficient to allow eval of LV diastolic function, normal LA in size..  . Cocaine abuse ([place])   . COPD (chronic obstructive pulmonary disease) ([place])   . Critical lower limb ischemia   . Depression   . Diabetic peripheral neuropathy ([place])   . Elevated troponin    a. Chronic elevation.  [name] GERD (gastroesophageal reflux disease)   . Hyperlipemia   .  Hypertension   . Hypokalemia   . Hypomagnesemia   . Hypothyroidism   . Marijuana abuse   . Narcotic abuse ([place])   . Noncompliance   . NSVT (nonsustained ventricular tachycardia) ([place])   . Obesity   . PAF (paroxysmal atrial fibrillation) ([place])   . Paroxysmal atrial tachycardia ([place])   . Peripheral arterial disease ([place])    a. 01/2015 Angio/PTA: LSFA 100 w/ recon @ adductor canal and 1 vessel runoff via AT, RSFA 99 (atherectomy/pta) - 1 vessel runoff via diff dzs peroneal.  . Pneumonia    "once or twice" (11/26/2018)  . Poorly controlled type 2 diabetes mellitus ([place])   . Renal insufficiency    a. 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  [name] Sleep apnea    "couldn't handle wearing the mask" (11/26/2018)  . Symptomatic  bradycardia    avoid AV blocking agent per EP  . Tobacco abuse     Past Surgical History:  Procedure Laterality Date  . AMPUTATION Right 06/14/2017   Procedure: Right foot transmetatarsal amputation;  Surgeon: Newt Minion, MD;  Location: [facility];  Service: Orthopedics;  Laterality: Right;  . AMPUTATION TOE Right 04/28/2017   Procedure: AMPUTATION OF RIGHT SECOND RAY;  Surgeon: Newt Minion, MD;  Location: [place];  Service: Orthopedics;  Laterality: Right;  . CARDIAC CATHETERIZATION    . CARDIAC CATHETERIZATION N/A 01/12/2016   Procedure: Temporary Wire;  Surgeon: Minus Breeding, MD;  Location: [place] CV LAB;  Service: Cardiovascular;  Laterality: N/A;  . CARDIOVERSION  ~ 02/2013   "twice"   . CAROTID ANGIOGRAM N/A 01/15/2015   Procedure: CAROTID ANGIOGRAM;  Surgeon: [name], MD;  Location: [facility] CATH LAB;  Service: Cardiovascular;  Laterality: N/A;  . DILATION AND CURETTAGE OF UTERUS  1988  . ENDARTERECTOMY Left 02/19/2015   Procedure: LEFT CAROTID ENDARTERECTOMY ;  Surgeon: Serafina Mitchell, MD;  Location: [place];  Service: Vascular;  Laterality: Left;  . LEFT HEART CATHETERIZATION WITH CORONARY ANGIOGRAM N/A 10/31/2014   Procedure: LEFT HEART CATHETERIZATION WITH CORONARY ANGIOGRAM;  Surgeon: Burnell Blanks, MD;  Location: [facility] CATH LAB;  Service: Cardiovascular;  Laterality: N/A;  . LOWER EXTREMITY ANGIOGRAM N/A 09/10/2013   Procedure: LOWER EXTREMITY ANGIOGRAM;  Surgeon: [name], MD;  Location: [facility] CATH LAB;  Service: Cardiovascular;  Laterality: N/A;  . LOWER EXTREMITY ANGIOGRAM N/A 01/15/2015   Procedure: LOWER EXTREMITY ANGIOGRAM;  Surgeon: [name], MD;  Location[facility] CATH LAB;  Service: Cardiovascular;  Laterality: N/A;  . LOWER EXTREMITY ANGIOGRAPHY N/A 04/13/2017   Procedure: Lower Extremity Angiography;  Surgeon: [name], MD;  Location: [place] CV LAB;  Service: Cardiovascular;  Laterality: N/A;  . PERIPHERAL VASCULAR INTERVENTION  04/13/2017    Procedure: Peripheral Vascular Intervention;  Surgeon: [name], MD;  Location: [place] CV LAB;  Service: Cardiovascular;;    Current Medications: Current Meds  Medication Sig  . albuterol (PROVENTIL HFA;VENTOLIN HFA) 108 (90 Base) MCG/ACT inhaler Inhale 2 puffs into the lungs every 6 (six) hours as needed for wheezing or shortness of breath.  [name] amiodarone (PACERONE) 100 MG tablet TAKE 1 TABLET (100 MG TOTAL) BY MOUTH DAILY (Patient taking differently: Take 100 mg by mouth daily. )  . amLODipine (NORVASC) 10 MG tablet TAKE 1 TABLET (10 MG TOTAL) BY MOUTH DAILY.  [name] apixaban (ELIQUIS) 5 MG TABS tablet Take 1 tablet (5 mg total) by mouth 2 (two) times daily.  [name] atorvastatin (LIPITOR) 40 MG tablet TAKE 1 TABLET BY  MOUTH ONCE EVERY EVENING (Patient taking differently: Take 40 mg by mouth daily. TAKE 1 TABLET BY MOUTH ONCE EVERY EVENING)  . bisoprolol (ZEBETA) 5 MG tablet Take 1 tablet (5 mg total) by mouth daily.  . cholecalciferol (VITAMIN D) 1000 units tablet Take 1,000 Units by mouth daily.   . clopidogrel (PLAVIX) 75 MG tablet Take 75 mg by mouth daily.  . folic acid (FOLVITE) 1 MG tablet Take 1 tablet (1 mg total) by mouth daily. (Patient taking differently: Take 1 mg by mouth daily. )  . ibuprofen (ADVIL) 600 MG tablet Take 1 tablet (600 mg total) by mouth every 6 (six) hours as needed.  . Insulin Glargine (LANTUS SOLOSTAR) 100 UNIT/ML Solostar Pen Inject 72 Units into the skin at bedtime.   . Insulin Lispro (HUMALOG KWIKPEN) 200 UNIT/ML SOPN Inject 35 Units into the skin 3 (three) times daily with meals.  [name] levothyroxine (SYNTHROID, LEVOTHROID) 50 MCG tablet Take 1 tablet (50 mcg total) by mouth daily.  [name] losartan (COZAAR) 100 MG tablet TAKE 1 TABLET (100 MG TOTAL) BY MOUTH DAILY.  . metolazone (ZAROXOLYN) 2.5 MG tablet Take 1 tablet (2.5 mg total) by mouth once a week. On Thursdays  . Multiple Vitamin (MULTIVITAMIN WITH MINERALS) TABS tablet Take 1 tablet by mouth daily.  .  potassium chloride SA (K-DUR) 20 MEQ tablet Take 2 tablets (40 mEq total) by mouth daily.  [name] spironolactone (ALDACTONE) 25 MG tablet Take 1 tablet (25 mg total) by mouth daily.  [name] thiamine 100 MG tablet Take 1 tablet (100 mg total) by mouth daily.  [name] torsemide (DEMADEX) 20 MG tablet Take 2 tablets (40 mg total) by mouth 2 (two) times daily.  . [DISCONTINUED] methocarbamol (ROBAXIN-750) 750 MG tablet Take 1 tablet (750 mg total) by mouth 4 (four) times daily.     Allergies:   Gabapentin and Lyrica [pregabalin]   Social History   Socioeconomic History  . Marital status: Divorced    Spouse name: Not on file  . Number of children: 1  . Years of education: 59  . Highest education level: 12th grade  Occupational History  . Occupation: disabled  Social Needs  . Financial resource strain: Somewhat hard  . Food insecurity    Worry: Never true    Inability: Never true  . Transportation needs    Medical: No    Non-medical: No  Tobacco Use  . Smoking status: Current Some Day Smoker    Packs/day: 1.00    Years: 44.00    Pack years: 44.00    Types: E-cigarettes, Cigarettes  . Smokeless tobacco: Former Systems developer    Types: Snuff  Substance and Sexual Activity  . Alcohol use: Yes    Comment: 11/26/2018 "couple drinks/month maybe"  . Drug use: Yes    Types: "Crack" cocaine, Marijuana, Oxycodone    Comment: last used 3 days ago   . Sexual activity: Not Currently  Lifestyle  . Physical activity    Days per week: Not on file    Minutes per session: Not on file  . Stress: Not on file  Relationships  . Social Herbalist on phone: Not on file    Gets together: Not on file    Attends religious service: Not on file    Active member of club or organization: Not on file    Attends meetings of clubs or organizations: Not on file    Relationship status: Not on file  Other Topics Concern  . Not  on file  Social History Narrative   Lives in [place], in [place] with sister.  They are looking to  move but don't have a place to go yet.       Family History: The patient's family history includes Breast cancer in her mother; Cancer in her mother; Clotting disorder in her mother; Diabetes in her mother; Emphysema in her sister; Heart attack in her mother; Heart disease in her father and mother; Hypertension in her father and mother.  ROS:   Please see the history of present illness.     All other systems reviewed and are negative.  EKGs/Labs/Other Studies Reviewed:    The following studies were reviewed today:   Recent Labs: 10/21/2018: TSH 1.157 11/27/2018: ALT 45; B Natriuretic Peptide 511.3 03/18/2019: BUN 46; Creatinine, Ser 2.10; Hemoglobin 9.5; Platelets 427; Potassium 4.4; Sodium 145  Recent Lipid Panel    Component Value Date/Time   CHOL 146 03/27/2018 1138   TRIG 222 (H) 03/27/2018 1138   HDL 39 (L) 03/27/2018 1138   CHOLHDL 3.7 03/27/2018 1138   VLDL 44 (H) 03/27/2018 1138   LDLCALC 63 03/27/2018 1138    Physical Exam:    VS:  BP (!) 115/54   Pulse 62   Ht 5\' 4"  (1.626 m)   Wt 227 lb (103 kg)   LMP 11/27/2012   SpO2 98%   BMI 38.96 kg/m     Wt Readings from Last 3 Encounters:  06/20/19 227 lb (103 kg)  04/14/19 235 lb (106.6 kg)  03/25/19 239 lb (108.4 kg)     GEN: Obese chronically ill appearing female in a wheel chair,  in no acute distress HEENT: Normal NECK: No JVD; No carotid bruits LYMPHATICS: No lymphadenopathy CARDIAC: RRR, no murmurs, rubs, gallops RESPIRATORY:  Clear to auscultation without rales, wheezing or rhonchi  ABDOMEN: Soft, non-tender, non-distended MUSCULOSKELETAL:  Both LE with 1+ rudy edema. She has a dimes sized ulcer on Rt 5 th toe and is S/P Ray on the Rt NEUROLOGIC:  Alert and oriented x 3 PSYCHIATRIC:  Normal affect   ASSESSMENT:    Non-healing ulcer ([place]) Lt 5 th toe  PAD (peripheral artery disease) ([place]) Peripheral arterial disease/critical limb ischemia Recent dopplers done July 2020 showed progression of  disease  S/P peripheral artery angioplasty - [place]bo[place] atherectomy; R SFA RSFA PTA 2014, May 2015 S/P Ray amputation   PAF (paroxysmal atrial fibrillation) ([name]) On Amiodarone and Eliquis  NICM (nonischemic cardiomyopathy) ([place]) Last EF 45% Dec 2019  DM (diabetes mellitus), type 2 with peripheral vascular complications ([place]) Pt has chronic LE neuropathy unable to tolerate Neurontin or Lyrica.   PLAN:    Check labs today.  Will review with Dr Gwenlyn Found this afternoon.   Medication Adjustments/Labs and Tests Ordered: Current medicines are reviewed at length with the patient today.  Concerns regarding medicines are outlined above.  Orders Placed This Encounter  Procedures  . Basic Metabolic Panel (BMET)  . CBC with Differential   No orders of the defined types were placed in this encounter.   Patient Instructions  Medication Instructions:  Your physician recommends that you continue on your current medications as directed. Please refer to the Current Medication list given to you today. If you need a refill on your cardiac medications before your next appointment, please call your pharmacy.   Lab work: Your physician recommends that you return for lab work in: TODAY-BMET, CBC If you have labs (blood work) drawn today and your tests are completely normal,  you will receive your results only by: [name] MyChart Message (if you have MyChart) OR . A paper copy in the mail If you have any lab test that is abnormal or we need to change your treatment, we will call you to review the results.  Testing/Procedures: NONE   Follow-Up: At [facility], you and your health needs are our priority.  As part of our continuing mission to provide you with exceptional heart care, we have created designated Provider Care Teams.  These Care Teams include your primary Cardiologist (physician) and Advanced Practice Providers (APPs -  Physician Assistants and Nurse Practitioners) who all work together to  provide you with the care you need, when you need it.  [name] YOUR FOLLOW UP WILL BE DETERMINED ONCE WE HEAR BACK FROM DR BERRY.  Any Other Special Instructions Will Be Listed Below (If Applicable).      Signed, Kerin Ransom, PA-C  06/20/2019 11:17 AM    [place]

## 2019-06-20 NOTE — Telephone Encounter (Signed)
See previous phone note, patient to go to the ER fro repeat.

## 2019-06-20 NOTE — Assessment & Plan Note (Signed)
Peripheral arterial disease/critical limb ischemia Recent dopplers done July 2020 showed progression of disease

## 2019-06-20 NOTE — Assessment & Plan Note (Signed)
Lt 5 th toe

## 2019-06-20 NOTE — Telephone Encounter (Signed)
Spoke with pt, aware to go to the ER for repeat lab work.

## 2019-06-20 NOTE — Assessment & Plan Note (Signed)
RSFA PTA 2014, May 2015 S/P Ray amputation

## 2019-06-20 NOTE — Assessment & Plan Note (Signed)
Last EF 45% Dec 2019

## 2019-06-20 NOTE — Patient Instructions (Signed)
Medication Instructions:  Your physician recommends that you continue on your current medications as directed. Please refer to the Current Medication list given to you today. If you need a refill on your cardiac medications before your next appointment, please call your pharmacy.   Lab work: Your physician recommends that you return for lab work in: TODAY-BMET, CBC If you have labs (blood work) drawn today and your tests are completely normal, you will receive your results only by: [name] MyChart Message (if you have MyChart) OR . A paper copy in the mail If you have any lab test that is abnormal or we need to change your treatment, we will call you to review the results.  Testing/Procedures: NONE   Follow-Up: At [facility], you and your health needs are our priority.  As part of our continuing mission to provide you with exceptional heart care, we have created designated Provider Care Teams.  These Care Teams include your primary Cardiologist (physician) and Advanced Practice Providers (APPs -  Physician Assistants and Nurse Practitioners) who all work together to provide you with the care you need, when you need it.  [name] YOUR FOLLOW UP WILL BE DETERMINED ONCE WE HEAR BACK FROM DR BERRY.  Any Other Special Instructions Will Be Listed Below (If Applicable).

## 2019-06-20 NOTE — Telephone Encounter (Signed)
She is not on K+ supplement or anything that would cause her to retain K+.  She needs a STAT repeat BMP in the morning.  Kerin Ransom PA-C 06/20/2019 4:14 PM

## 2019-06-20 NOTE — Assessment & Plan Note (Signed)
On Amiodarone and Eliquis

## 2019-06-21 ENCOUNTER — Inpatient Hospital Stay (HOSPITAL_COMMUNITY)
Admission: EM | Admit: 2019-06-21 | Discharge: 2019-07-08 | DRG: 240 | Disposition: A | Discharge status: Skilled Nursing Facility | Payer: Medicare Other | Attending: Internal Medicine | Admitting: Internal Medicine

## 2019-06-21 ENCOUNTER — Other Ambulatory Visit: Payer: Self-pay

## 2019-06-21 ENCOUNTER — Emergency Department (HOSPITAL_COMMUNITY): Payer: Medicare Other

## 2019-06-21 ENCOUNTER — Encounter: Payer: Self-pay | Admitting: Cardiology

## 2019-06-21 DIAGNOSIS — I480 Paroxysmal atrial fibrillation: Secondary | ICD-10-CM | POA: Diagnosis present

## 2019-06-21 DIAGNOSIS — Z20828 Contact with and (suspected) exposure to other viral communicable diseases: Secondary | ICD-10-CM | POA: Diagnosis present

## 2019-06-21 DIAGNOSIS — Z7902 Long term (current) use of antithrombotics/antiplatelets: Secondary | ICD-10-CM

## 2019-06-21 DIAGNOSIS — I70262 Atherosclerosis of native arteries of extremities with gangrene, left leg: Secondary | ICD-10-CM | POA: Diagnosis present

## 2019-06-21 DIAGNOSIS — I509 Heart failure, unspecified: Secondary | ICD-10-CM | POA: Diagnosis not present

## 2019-06-21 DIAGNOSIS — E1152 Type 2 diabetes mellitus with diabetic peripheral angiopathy with gangrene: Principal | ICD-10-CM | POA: Diagnosis present

## 2019-06-21 DIAGNOSIS — E875 Hyperkalemia: Secondary | ICD-10-CM | POA: Diagnosis present

## 2019-06-21 DIAGNOSIS — Z89431 Acquired absence of right foot: Secondary | ICD-10-CM

## 2019-06-21 DIAGNOSIS — I130 Hypertensive heart and chronic kidney disease with heart failure and stage 1 through stage 4 chronic kidney disease, or unspecified chronic kidney disease: Secondary | ICD-10-CM | POA: Diagnosis present

## 2019-06-21 DIAGNOSIS — M869 Osteomyelitis, unspecified: Secondary | ICD-10-CM

## 2019-06-21 DIAGNOSIS — Z79890 Hormone replacement therapy (postmenopausal): Secondary | ICD-10-CM

## 2019-06-21 DIAGNOSIS — I6521 Occlusion and stenosis of right carotid artery: Secondary | ICD-10-CM | POA: Diagnosis not present

## 2019-06-21 DIAGNOSIS — F1010 Alcohol abuse, uncomplicated: Secondary | ICD-10-CM | POA: Diagnosis present

## 2019-06-21 DIAGNOSIS — L97529 Non-pressure chronic ulcer of other part of left foot with unspecified severity: Secondary | ICD-10-CM | POA: Diagnosis present

## 2019-06-21 DIAGNOSIS — Z794 Long term (current) use of insulin: Secondary | ICD-10-CM

## 2019-06-21 DIAGNOSIS — E538 Deficiency of other specified B group vitamins: Secondary | ICD-10-CM | POA: Diagnosis present

## 2019-06-21 DIAGNOSIS — K219 Gastro-esophageal reflux disease without esophagitis: Secondary | ICD-10-CM | POA: Diagnosis present

## 2019-06-21 DIAGNOSIS — E039 Hypothyroidism, unspecified: Secondary | ICD-10-CM | POA: Diagnosis present

## 2019-06-21 DIAGNOSIS — Z8701 Personal history of pneumonia (recurrent): Secondary | ICD-10-CM

## 2019-06-21 DIAGNOSIS — D631 Anemia in chronic kidney disease: Secondary | ICD-10-CM | POA: Diagnosis present

## 2019-06-21 DIAGNOSIS — E785 Hyperlipidemia, unspecified: Secondary | ICD-10-CM | POA: Diagnosis present

## 2019-06-21 DIAGNOSIS — D649 Anemia, unspecified: Secondary | ICD-10-CM | POA: Diagnosis present

## 2019-06-21 DIAGNOSIS — E11628 Type 2 diabetes mellitus with other skin complications: Secondary | ICD-10-CM | POA: Diagnosis present

## 2019-06-21 DIAGNOSIS — J449 Chronic obstructive pulmonary disease, unspecified: Secondary | ICD-10-CM | POA: Diagnosis present

## 2019-06-21 DIAGNOSIS — I5022 Chronic systolic (congestive) heart failure: Secondary | ICD-10-CM

## 2019-06-21 DIAGNOSIS — Z8041 Family history of malignant neoplasm of ovary: Secondary | ICD-10-CM

## 2019-06-21 DIAGNOSIS — Z22322 Carrier or suspected carrier of Methicillin resistant Staphylococcus aureus: Secondary | ICD-10-CM

## 2019-06-21 DIAGNOSIS — Z8249 Family history of ischemic heart disease and other diseases of the circulatory system: Secondary | ICD-10-CM

## 2019-06-21 DIAGNOSIS — M25531 Pain in right wrist: Secondary | ICD-10-CM | POA: Diagnosis not present

## 2019-06-21 DIAGNOSIS — Z888 Allergy status to other drugs, medicaments and biological substances status: Secondary | ICD-10-CM

## 2019-06-21 DIAGNOSIS — I2510 Atherosclerotic heart disease of native coronary artery without angina pectoris: Secondary | ICD-10-CM | POA: Diagnosis present

## 2019-06-21 DIAGNOSIS — I779 Disorder of arteries and arterioles, unspecified: Secondary | ICD-10-CM | POA: Diagnosis present

## 2019-06-21 DIAGNOSIS — M86179 Other acute osteomyelitis, unspecified ankle and foot: Secondary | ICD-10-CM | POA: Diagnosis not present

## 2019-06-21 DIAGNOSIS — K7030 Alcoholic cirrhosis of liver without ascites: Secondary | ICD-10-CM | POA: Diagnosis present

## 2019-06-21 DIAGNOSIS — M7989 Other specified soft tissue disorders: Secondary | ICD-10-CM | POA: Diagnosis not present

## 2019-06-21 DIAGNOSIS — Z6841 Body Mass Index (BMI) 40.0 and over, adult: Secondary | ICD-10-CM

## 2019-06-21 DIAGNOSIS — I5033 Acute on chronic diastolic (congestive) heart failure: Secondary | ICD-10-CM | POA: Diagnosis present

## 2019-06-21 DIAGNOSIS — I878 Other specified disorders of veins: Secondary | ICD-10-CM | POA: Diagnosis present

## 2019-06-21 DIAGNOSIS — I739 Peripheral vascular disease, unspecified: Secondary | ICD-10-CM

## 2019-06-21 DIAGNOSIS — E1151 Type 2 diabetes mellitus with diabetic peripheral angiopathy without gangrene: Secondary | ICD-10-CM | POA: Diagnosis present

## 2019-06-21 DIAGNOSIS — E1169 Type 2 diabetes mellitus with other specified complication: Secondary | ICD-10-CM | POA: Diagnosis present

## 2019-06-21 DIAGNOSIS — M170 Bilateral primary osteoarthritis of knee: Secondary | ICD-10-CM | POA: Diagnosis present

## 2019-06-21 DIAGNOSIS — I5042 Chronic combined systolic (congestive) and diastolic (congestive) heart failure: Secondary | ICD-10-CM | POA: Diagnosis present

## 2019-06-21 DIAGNOSIS — Z800 Family history of malignant neoplasm of digestive organs: Secondary | ICD-10-CM

## 2019-06-21 DIAGNOSIS — M25539 Pain in unspecified wrist: Secondary | ICD-10-CM

## 2019-06-21 DIAGNOSIS — Z79899 Other long term (current) drug therapy: Secondary | ICD-10-CM

## 2019-06-21 DIAGNOSIS — Z833 Family history of diabetes mellitus: Secondary | ICD-10-CM

## 2019-06-21 DIAGNOSIS — F329 Major depressive disorder, single episode, unspecified: Secondary | ICD-10-CM | POA: Diagnosis present

## 2019-06-21 DIAGNOSIS — E1142 Type 2 diabetes mellitus with diabetic polyneuropathy: Secondary | ICD-10-CM | POA: Diagnosis present

## 2019-06-21 DIAGNOSIS — E11621 Type 2 diabetes mellitus with foot ulcer: Secondary | ICD-10-CM | POA: Diagnosis present

## 2019-06-21 DIAGNOSIS — E119 Type 2 diabetes mellitus without complications: Secondary | ICD-10-CM | POA: Diagnosis present

## 2019-06-21 DIAGNOSIS — Z9889 Other specified postprocedural states: Secondary | ICD-10-CM | POA: Diagnosis not present

## 2019-06-21 DIAGNOSIS — G4733 Obstructive sleep apnea (adult) (pediatric): Secondary | ICD-10-CM | POA: Diagnosis present

## 2019-06-21 DIAGNOSIS — K08109 Complete loss of teeth, unspecified cause, unspecified class: Secondary | ICD-10-CM | POA: Diagnosis present

## 2019-06-21 DIAGNOSIS — E1165 Type 2 diabetes mellitus with hyperglycemia: Secondary | ICD-10-CM | POA: Diagnosis present

## 2019-06-21 DIAGNOSIS — I70202 Unspecified atherosclerosis of native arteries of extremities, left leg: Secondary | ICD-10-CM | POA: Diagnosis not present

## 2019-06-21 DIAGNOSIS — E1122 Type 2 diabetes mellitus with diabetic chronic kidney disease: Secondary | ICD-10-CM | POA: Diagnosis present

## 2019-06-21 DIAGNOSIS — F419 Anxiety disorder, unspecified: Secondary | ICD-10-CM | POA: Diagnosis present

## 2019-06-21 DIAGNOSIS — Z7901 Long term (current) use of anticoagulants: Secondary | ICD-10-CM

## 2019-06-21 DIAGNOSIS — D638 Anemia in other chronic diseases classified elsewhere: Secondary | ICD-10-CM | POA: Diagnosis present

## 2019-06-21 DIAGNOSIS — N183 Chronic kidney disease, stage 3 unspecified: Secondary | ICD-10-CM | POA: Diagnosis present

## 2019-06-21 DIAGNOSIS — I6529 Occlusion and stenosis of unspecified carotid artery: Secondary | ICD-10-CM | POA: Diagnosis present

## 2019-06-21 DIAGNOSIS — I998 Other disorder of circulatory system: Secondary | ICD-10-CM | POA: Diagnosis not present

## 2019-06-21 DIAGNOSIS — E1159 Type 2 diabetes mellitus with other circulatory complications: Secondary | ICD-10-CM | POA: Diagnosis present

## 2019-06-21 DIAGNOSIS — F17290 Nicotine dependence, other tobacco product, uncomplicated: Secondary | ICD-10-CM | POA: Diagnosis present

## 2019-06-21 DIAGNOSIS — I428 Other cardiomyopathies: Secondary | ICD-10-CM

## 2019-06-21 DIAGNOSIS — Z803 Family history of malignant neoplasm of breast: Secondary | ICD-10-CM

## 2019-06-21 DIAGNOSIS — F17210 Nicotine dependence, cigarettes, uncomplicated: Secondary | ICD-10-CM | POA: Diagnosis present

## 2019-06-21 DIAGNOSIS — G629 Polyneuropathy, unspecified: Secondary | ICD-10-CM

## 2019-06-21 DIAGNOSIS — I152 Hypertension secondary to endocrine disorders: Secondary | ICD-10-CM | POA: Diagnosis present

## 2019-06-21 DIAGNOSIS — I10 Essential (primary) hypertension: Secondary | ICD-10-CM | POA: Diagnosis present

## 2019-06-21 DIAGNOSIS — L03119 Cellulitis of unspecified part of limb: Secondary | ICD-10-CM | POA: Diagnosis present

## 2019-06-21 HISTORY — DX: Osteomyelitis, unspecified: M86.9

## 2019-06-21 LAB — CBC WITH DIFFERENTIAL/PLATELET
Abs Immature Granulocytes: 0.05 10*3/uL (ref 0.00–0.07)
Basophils Absolute: 0 10*3/uL (ref 0.0–0.1)
Basophils Relative: 0 %
Eosinophils Absolute: 0.3 10*3/uL (ref 0.0–0.5)
Eosinophils Relative: 3 %
HCT: 31.4 % — ABNORMAL LOW (ref 36.0–46.0)
Hemoglobin: 9.4 g/dL — ABNORMAL LOW (ref 12.0–15.0)
Immature Granulocytes: 0 %
Lymphocytes Relative: 14 %
Lymphs Abs: 1.7 10*3/uL (ref 0.7–4.0)
MCH: 29.9 pg (ref 26.0–34.0)
MCHC: 29.9 g/dL — ABNORMAL LOW (ref 30.0–36.0)
MCV: 100 fL (ref 80.0–100.0)
Monocytes Absolute: 0.9 10*3/uL (ref 0.1–1.0)
Monocytes Relative: 8 %
Neutro Abs: 9 10*3/uL — ABNORMAL HIGH (ref 1.7–7.7)
Neutrophils Relative %: 75 %
Platelets: 316 10*3/uL (ref 150–400)
RBC: 3.14 MIL/uL — ABNORMAL LOW (ref 3.87–5.11)
RDW: 13.4 % (ref 11.5–15.5)
WBC: 12 10*3/uL — ABNORMAL HIGH (ref 4.0–10.5)
nRBC: 0 % (ref 0.0–0.2)

## 2019-06-21 LAB — BASIC METABOLIC PANEL
Anion gap: 13 (ref 5–15)
BUN: 95 mg/dL — ABNORMAL HIGH (ref 6–20)
CO2: 17 mmol/L — ABNORMAL LOW (ref 22–32)
Calcium: 9.1 mg/dL (ref 8.9–10.3)
Chloride: 107 mmol/L (ref 98–111)
Creatinine, Ser: 2.07 mg/dL — ABNORMAL HIGH (ref 0.44–1.00)
GFR calc Af Amer: 30 mL/min — ABNORMAL LOW (ref >60)
GFR calc non Af Amer: 26 mL/min — ABNORMAL LOW (ref >60)
Glucose, Bld: 217 mg/dL — ABNORMAL HIGH (ref 70–99)
Potassium: 5 mmol/L (ref 3.5–5.1)
Sodium: 137 mmol/L (ref 135–145)

## 2019-06-21 LAB — HEMOGLOBIN A1C
Hgb A1c MFr Bld: 7.2 % — ABNORMAL HIGH (ref 4.8–5.6)
Mean Plasma Glucose: 159.94 mg/dL

## 2019-06-21 LAB — GLUCOSE, CAPILLARY
Glucose-Capillary: 212 mg/dL — ABNORMAL HIGH (ref 70–99)
Glucose-Capillary: 143 mg/dL — ABNORMAL HIGH (ref 70–99)

## 2019-06-21 LAB — SARS CORONAVIRUS 2 (TAT 6-24 HRS): SARS Coronavirus 2: NEGATIVE

## 2019-06-21 MED ORDER — ADULT MULTIVITAMIN W/MINERALS CH
1.0000 | ORAL_TABLET | Freq: Every day | ORAL | Status: DC
  Administered 2019-06-22 – 2019-07-08 (×15): 1 via ORAL
  Filled 2019-06-21 (×15): qty 1

## 2019-06-21 MED ORDER — FOLIC ACID 1 MG PO TABS
1.0000 mg | ORAL_TABLET | Freq: Every day | ORAL | Status: DC
  Administered 2019-06-22 – 2019-07-08 (×15): 1 mg via ORAL
  Filled 2019-06-21 (×15): qty 1

## 2019-06-21 MED ORDER — AMIODARONE HCL 200 MG PO TABS
100.0000 mg | ORAL_TABLET | Freq: Every day | ORAL | Status: DC
  Administered 2019-06-22 – 2019-07-08 (×15): 100 mg via ORAL
  Filled 2019-06-21 (×15): qty 1

## 2019-06-21 MED ORDER — TORSEMIDE 20 MG PO TABS
40.0000 mg | ORAL_TABLET | Freq: Two times a day (BID) | ORAL | Status: DC
  Administered 2019-06-21 – 2019-07-08 (×32): 40 mg via ORAL
  Filled 2019-06-21 (×32): qty 2

## 2019-06-21 MED ORDER — APIXABAN 5 MG PO TABS
5.0000 mg | ORAL_TABLET | Freq: Two times a day (BID) | ORAL | Status: DC
  Administered 2019-06-21 – 2019-06-23 (×5): 5 mg via ORAL
  Filled 2019-06-21 (×6): qty 1

## 2019-06-21 MED ORDER — NICOTINE 21 MG/24HR TD PT24
21.0000 mg | MEDICATED_PATCH | Freq: Every day | TRANSDERMAL | Status: DC
  Administered 2019-06-21 – 2019-07-08 (×16): 21 mg via TRANSDERMAL
  Filled 2019-06-21 (×16): qty 1

## 2019-06-21 MED ORDER — INSULIN GLARGINE 100 UNIT/ML SOLOSTAR PEN: 30.0000 [IU] | PEN_INJECTOR | Freq: Every day | SUBCUTANEOUS | Status: DC

## 2019-06-21 MED ORDER — BISOPROLOL FUMARATE 5 MG PO TABS
5.0000 mg | ORAL_TABLET | Freq: Every day | ORAL | Status: DC
  Administered 2019-06-23 – 2019-07-08 (×15): 5 mg via ORAL
  Filled 2019-06-21 (×18): qty 1

## 2019-06-21 MED ORDER — VANCOMYCIN HCL 10 G IV SOLR
2000.0000 mg | Freq: Once | INTRAVENOUS | Status: AC
  Administered 2019-06-21: 2000 mg via INTRAVENOUS
  Filled 2019-06-21: qty 2000

## 2019-06-21 MED ORDER — VITAMIN B-1 100 MG PO TABS
100.0000 mg | ORAL_TABLET | Freq: Every day | ORAL | Status: DC
  Administered 2019-06-22 – 2019-07-08 (×15): 100 mg via ORAL
  Filled 2019-06-21 (×16): qty 1

## 2019-06-21 MED ORDER — ATORVASTATIN CALCIUM 40 MG PO TABS
40.0000 mg | ORAL_TABLET | Freq: Every day | ORAL | Status: DC
  Administered 2019-06-21 – 2019-07-08 (×17): 40 mg via ORAL
  Filled 2019-06-21 (×16): qty 1

## 2019-06-21 MED ORDER — VITAMIN D 25 MCG (1000 UNIT) PO TABS
1000.0000 [IU] | ORAL_TABLET | Freq: Every day | ORAL | Status: DC
  Administered 2019-06-22 – 2019-07-08 (×15): 1000 [IU] via ORAL
  Filled 2019-06-21 (×15): qty 1

## 2019-06-21 MED ORDER — ACETAMINOPHEN 650 MG RE SUPP: 650.0000 mg | Freq: Four times a day (QID) | RECTAL | Status: DC | PRN

## 2019-06-21 MED ORDER — LEVOTHYROXINE SODIUM 50 MCG PO TABS
50.0000 ug | ORAL_TABLET | Freq: Every day | ORAL | Status: DC
  Administered 2019-06-22 – 2019-07-08 (×17): 50 ug via ORAL
  Filled 2019-06-21 (×16): qty 1

## 2019-06-21 MED ORDER — ACETAMINOPHEN 325 MG PO TABS
650.0000 mg | ORAL_TABLET | Freq: Four times a day (QID) | ORAL | Status: DC | PRN
  Administered 2019-06-21 – 2019-06-29 (×11): 650 mg via ORAL
  Filled 2019-06-21 (×12): qty 2

## 2019-06-21 MED ORDER — INSULIN ASPART 100 UNIT/ML ~~LOC~~ SOLN
0.0000 [IU] | Freq: Every day | SUBCUTANEOUS | Status: DC
  Administered 2019-06-24: 2 [IU] via SUBCUTANEOUS
  Administered 2019-06-25 – 2019-06-27 (×2): 3 [IU] via SUBCUTANEOUS
  Administered 2019-06-28: 4 [IU] via SUBCUTANEOUS
  Administered 2019-06-29 – 2019-07-04 (×4): 3 [IU] via SUBCUTANEOUS
  Administered 2019-07-05: 2 [IU] via SUBCUTANEOUS
  Administered 2019-07-07: 3 [IU] via SUBCUTANEOUS

## 2019-06-21 MED ORDER — AMLODIPINE BESYLATE 10 MG PO TABS
10.0000 mg | ORAL_TABLET | Freq: Every day | ORAL | Status: DC
  Administered 2019-06-22 – 2019-07-08 (×16): 10 mg via ORAL
  Filled 2019-06-21 (×16): qty 1

## 2019-06-21 MED ORDER — SODIUM CHLORIDE 0.9 % IV SOLN
2.0000 g | INTRAVENOUS | Status: DC
  Administered 2019-06-21 – 2019-06-29 (×8): 2 g via INTRAVENOUS
  Filled 2019-06-21 (×3): qty 20
  Filled 2019-06-21 (×2): qty 2
  Filled 2019-06-21: qty 20
  Filled 2019-06-21: qty 2
  Filled 2019-06-21 (×2): qty 20

## 2019-06-21 MED ORDER — INSULIN GLARGINE 100 UNIT/ML ~~LOC~~ SOLN
30.0000 [IU] | Freq: Every day | SUBCUTANEOUS | Status: DC
  Administered 2019-06-21 – 2019-06-24 (×4): 30 [IU] via SUBCUTANEOUS
  Filled 2019-06-21 (×5): qty 0.3

## 2019-06-21 MED ORDER — INSULIN ASPART 100 UNIT/ML ~~LOC~~ SOLN
4.0000 [IU] | Freq: Three times a day (TID) | SUBCUTANEOUS | Status: DC
  Administered 2019-06-21 – 2019-06-25 (×9): 4 [IU] via SUBCUTANEOUS

## 2019-06-21 MED ORDER — INSULIN ASPART 100 UNIT/ML ~~LOC~~ SOLN
0.0000 [IU] | Freq: Three times a day (TID) | SUBCUTANEOUS | Status: DC
  Administered 2019-06-21: 5 [IU] via SUBCUTANEOUS
  Administered 2019-06-22: 2 [IU] via SUBCUTANEOUS
  Administered 2019-06-22 (×2): 3 [IU] via SUBCUTANEOUS
  Administered 2019-06-23: 5 [IU] via SUBCUTANEOUS
  Administered 2019-06-23: 3 [IU] via SUBCUTANEOUS
  Administered 2019-06-23 – 2019-06-24 (×3): 5 [IU] via SUBCUTANEOUS
  Administered 2019-06-24: 3 [IU] via SUBCUTANEOUS
  Administered 2019-06-25: 5 [IU] via SUBCUTANEOUS
  Administered 2019-06-25: 8 [IU] via SUBCUTANEOUS
  Administered 2019-06-25: 17:00:00 15 [IU] via SUBCUTANEOUS

## 2019-06-21 MED ORDER — CLOPIDOGREL BISULFATE 75 MG PO TABS
75.0000 mg | ORAL_TABLET | Freq: Every day | ORAL | Status: DC
  Administered 2019-06-22 – 2019-07-08 (×15): 75 mg via ORAL
  Filled 2019-06-21 (×16): qty 1

## 2019-06-21 MED ORDER — VANCOMYCIN HCL 10 G IV SOLR: 1250.0000 mg | INTRAVENOUS | Status: DC

## 2019-06-21 NOTE — H&P (Addendum)
TRH H&P   Patient Demographics:    [name], is a 58 y.o. female  MRN: [id number]   DOB - 1961-11-08  Admit Date - 06/21/2019  Outpatient Primary MD for the patient is Tsosie Billing, MD (Inactive)  Referring MD/NP/PA: PA ward  Outpatient Specialists: Patient been followed by Dr. Alvester Chou in the past regarding PVD of bilateral lower extremity with multiple angiograms and stent placed, she did see vascular Dr. Trula Slade for carotid stenosis, she did see Dr. Sharol Given in the past for right transmetatarsal amputation. Patient coming from: home  Chief Complaint  Patient presents with   Abnormal Lab      HPI:    [name]  is a 58 y.o. female, with past medical history of diabetes mellitus, tobacco abuse, still smoking, CAD, peripheral vascular disease s/p angiogram and stent by Dr. Gwenlyn Found, A. Fib on Eliquis, COPD, cirrhosis, OSA, CKD stage III, hypertension. -She presents to ED secondary to nonhealing left fifth toe wounds, as well sent by her PCP for hyperkalemia, apparently patient had potassium of 5.9 yesterday at PCP office, she was asked to come to ED, in ED potassium was 5, she is on Aldactone, losartan and potassium supplement, as well she was noted to have left fifth toe nonhealing ulcer, with foul-smelling odor, report is been for last 3 weeks, she has not been given any antibiotics, denies fever, chills, shortness of breath. -NAD x-ray significant for osteomyelitis of left fifth toe, potassium of 5, and reddening of 2, white blood cell of 12,000, I was called to admit.    Review of systems:    In addition to the HPI above, No Fever-chills, No Headache, No changes with Vision or hearing, No problems swallowing food or Liquids, No Chest pain, Cough or Shortness of Breath, No Abdominal pain, No Nausea or Vommitting, Bowel movements are regular, No Blood in stool or  Urine, No dysuria, Report left fifth toe ulcer for last 3 weeks, No new joints pains-aches,  No new weakness, tingling, numbness in any extremity, No recent weight gain or loss, No polyuria, polydypsia or polyphagia, No significant Mental Stressors.  A full 10 point Review of Systems was done, except as stated above, all other Review of Systems were negative.   With Past History of the following :    Past Medical History:  Diagnosis Date   Alcohol abuse    Alcoholic cirrhosis ([place])    Anemia    Anxiety    Arthritis    "knees" (11/26/2018)   B12 deficiency    CAD (coronary artery disease)    a. cath 11/10/2014 LM nl, LAD min irregs, D1 30 ost, D2 50d, LCX 62m, OM1 80 p/m (1.5 mm vessel), OM2 2m, RCA nondom 68m-->med rx.. Demand ischemia in the setting of rapid a-fib.   Cardiomyopathy [facility])    Carotid artery disease ([place])    a. 01/2015 Carotid Angio:  RICA [id number], LICA [id number]. s/p L carotid endarterectomy 02/2015.   Cellulitis    lower extremities   Chronic combined systolic and diastolic CHF (congestive heart failure) ([place])    a. Last echo 12/205: EF 40-45%, inferoapical/posterior HK, not technically sufficient to allow eval of LV diastolic function, normal LA in size..   Cocaine abuse ([name])    COPD (chronic obstructive pulmonary disease) ([name])    Critical lower limb ischemia    Depression    Diabetic peripheral neuropathy ([name])    Elevated troponin    a. Chronic elevation.   GERD (gastroesophageal reflux disease)    Hyperlipemia    Hypertension    Hypokalemia    Hypomagnesemia    Hypothyroidism    Marijuana abuse    Narcotic abuse ([place])    Noncompliance    NSVT (nonsustained ventricular tachycardia) ([name])    Obesity    PAF (paroxysmal atrial fibrillation) ([name])    Paroxysmal atrial tachycardia ([name])    Peripheral arterial disease ([place])    a. 01/2015 Angio/PTA: LSFA 100 w/ recon @ adductor canal and 1 vessel runoff via AT, RSFA 99  (atherectomy/pta) - 1 vessel runoff via diff dzs peroneal.   Pneumonia    "once or twice" (11/26/2018)   Poorly controlled type 2 diabetes mellitus ([place])    Renal insufficiency    a. 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   Sleep apnea    "couldn't handle wearing the mask" (11/26/2018)   Symptomatic bradycardia    avoid AV blocking agent per EP   Tobacco abuse       Past Surgical History:  Procedure Laterality Date   AMPUTATION Right 06/14/2017   Procedure: Right foot transmetatarsal amputation;  Surgeon: Newt Minion, MD;  Location: [place];  Service: Orthopedics;  Laterality: Right;   AMPUTATION TOE Right 04/28/2017   Procedure: AMPUTATION OF RIGHT SECOND RAY;  Surgeon: Newt Minion, MD;  Location: [place];  Service: Orthopedics;  Laterality: Right;   CARDIAC CATHETERIZATION     CARDIAC CATHETERIZATION N/A 01/12/2016   Procedure: Temporary Wire;  Surgeon: Minus Breeding, MD;  Location: [place] CV LAB;  Service: Cardiovascular;  Laterality: N/A;   CARDIOVERSION  ~ 02/2013   "twice"    CAROTID ANGIOGRAM N/A 01/15/2015   Procedure: CAROTID ANGIOGRAM;  Surgeon: [name], MD;  Location: [facility] CATH LAB;  Service: Cardiovascular;  Laterality: N/A;   DILATION AND CURETTAGE OF UTERUS  1988   ENDARTERECTOMY Left 02/19/2015   Procedure: LEFT CAROTID ENDARTERECTOMY ;  Surgeon: Serafina Mitchell, MD;  Location: [place];  Service: Vascular;  Laterality: Left;   LEFT HEART CATHETERIZATION WITH CORONARY ANGIOGRAM N/A 10/31/2014   Procedure: LEFT HEART CATHETERIZATION WITH CORONARY ANGIOGRAM;  Surgeon: Burnell Blanks, MD;  Location: [facility] CATH LAB;  Service: Cardiovascular;  Laterality: N/A;   LOWER EXTREMITY ANGIOGRAM N/A 09/10/2013   Procedure: LOWER EXTREMITY ANGIOGRAM;  Surgeon: [name], MD;  Location: [facility] CATH LAB;  Service: Cardiovascular;  Laterality: N/A;   LOWER EXTREMITY ANGIOGRAM N/A 01/15/2015   Procedure: LOWER EXTREMITY ANGIOGRAM;  Surgeon: [name], MD;  Location: [facility]  CATH LAB;  Service: Cardiovascular;  Laterality: N/A;   LOWER EXTREMITY ANGIOGRAPHY N/A 04/13/2017   Procedure: Lower Extremity Angiography;  Surgeon: [name], MD;  Location: [place] CV LAB;  Service: Cardiovascular;  Laterality: N/A;   PERIPHERAL VASCULAR INTERVENTION  04/13/2017   Procedure: Peripheral Vascular Intervention;  Surgeon: [name], MD;  Location: [place] CV LAB;  Service: Cardiovascular;;  Social History:     Social History   Tobacco Use   Smoking status: Current Some Day Smoker    Packs/day: 1.00    Years: 44.00    Pack years: 44.00    Types: E-cigarettes, Cigarettes   Smokeless tobacco: Former Systems developer    Types: Snuff  Substance Use Topics   Alcohol use: Yes    Comment: 11/26/2018 "couple drinks/month maybe"      Family History :     Family History  Problem Relation Age of Onset   Hypertension Mother    Diabetes Mother    Cancer Mother        breast, ovarian, colon   Clotting disorder Mother    Heart disease Mother    Heart attack Mother    Breast cancer Mother        in 38's   Hypertension Father    Heart disease Father    Emphysema Sister        smoked      Home Medications:   Prior to Admission medications   Medication Sig Start Date End Date Taking? Authorizing Provider  acetaminophen (TYLENOL) 500 MG tablet Take 1,000 mg by mouth every 6 (six) hours as needed for mild pain.   Yes [provider]  albuterol (PROVENTIL HFA;VENTOLIN HFA) 108 (90 Base) MCG/ACT inhaler Inhale 2 puffs into the lungs every 6 (six) hours as needed for wheezing or shortness of breath. 08/09/17  Yes Medina-Vargas, Monina C, NP  amiodarone (PACERONE) 100 MG tablet TAKE 1 TABLET (100 MG TOTAL) BY MOUTH DAILY Patient taking differently: Take 100 mg by mouth daily.  09/20/18  Yes Larey Dresser, MD  amLODipine (NORVASC) 10 MG tablet TAKE 1 TABLET (10 MG TOTAL) BY MOUTH DAILY. 05/01/19  Yes Larey Dresser, MD  apixaban  (ELIQUIS) 5 MG TABS tablet Take 1 tablet (5 mg total) by mouth 2 (two) times daily. 08/09/17  Yes Medina-Vargas, Monina C, NP  Aspirin-Caffeine (BC FAST PAIN RELIEF PO) Take 1 Package by mouth as needed (headache).   Yes [provider]  atorvastatin (LIPITOR) 40 MG tablet TAKE 1 TABLET BY MOUTH ONCE EVERY EVENING Patient taking differently: Take 40 mg by mouth daily. TAKE 1 TABLET BY MOUTH ONCE EVERY EVENING 08/09/17  Yes Medina-Vargas, Monina C, NP  bisoprolol (ZEBETA) 5 MG tablet Take 1 tablet (5 mg total) by mouth daily. 05/01/19  Yes Larey Dresser, MD  cholecalciferol (VITAMIN D) 1000 units tablet Take 1,000 Units by mouth daily.    Yes [provider]  clopidogrel (PLAVIX) 75 MG tablet Take 75 mg by mouth daily.   Yes [provider]  folic acid (FOLVITE) 1 MG tablet Take 1 tablet (1 mg total) by mouth daily. Patient taking differently: Take 1 mg by mouth daily.  10/25/18  Yes Kayleen Memos, DO  Insulin Glargine (LANTUS SOLOSTAR) 100 UNIT/ML Solostar Pen Inject 40 Units into the skin at bedtime.    Yes [provider]  Insulin Lispro (HUMALOG KWIKPEN) 200 UNIT/ML SOPN Inject 15 Units into the skin 3 (three) times daily with meals.    Yes [provider]  levothyroxine (SYNTHROID, LEVOTHROID) 50 MCG tablet Take 1 tablet (50 mcg total) by mouth daily. 08/09/17  Yes Medina-Vargas, Monina C, NP  losartan (COZAAR) 100 MG tablet TAKE 1 TABLET (100 MG TOTAL) BY MOUTH DAILY. 04/02/19  Yes Bensimhon, Shaune Pascal, MD  metolazone (ZAROXOLYN) 2.5 MG tablet Take 1 tablet (2.5 mg total) by mouth once a week.  On Thursdays 01/30/19  Yes Tillery, Satira Mccallum, PA-C  Multiple Vitamin (MULTIVITAMIN WITH MINERALS) TABS tablet Take 1 tablet by mouth daily. 10/25/18  Yes Irene Pap N, DO  potassium chloride SA (K-DUR) 20 MEQ tablet Take 2 tablets (40 mEq total) by mouth daily. 05/01/19  Yes Larey Dresser, MD  spironolactone (ALDACTONE) 25 MG tablet Take 1 tablet (25 mg  total) by mouth daily. 05/01/19  Yes Larey Dresser, MD  thiamine 100 MG tablet Take 1 tablet (100 mg total) by mouth daily. 10/25/18  Yes Kayleen Memos, DO  torsemide (DEMADEX) 20 MG tablet Take 2 tablets (40 mg total) by mouth 2 (two) times daily. 01/30/19  Yes Shirley Friar, PA-C  ibuprofen (ADVIL) 600 MG tablet Take 1 tablet (600 mg total) by mouth every 6 (six) hours as needed. 03/25/19   Charlann Lange, PA-C     Allergies:     Allergies  Allergen Reactions   Gabapentin Nausea And Vomiting and Other (See Comments)    POSSIBLE SHAKING? TAKING MED CURRENTLY   Lyrica [Pregabalin] Other (See Comments)    Shaking Pt still taking lyrica--"probably will stop taking"      Physical Exam:   Vitals  Blood pressure 126/73, pulse (!) 57, temperature 98.3 F (36.8 C), temperature source Oral, resp. rate 14, last menstrual period 11/27/2012, SpO2 100 %.   1. General well-developed female, laying in bed in no apparent distress  2. Normal affect and insight, Not Suicidal or Homicidal, Awake Alert, Oriented X 3.  3. No F.N deficits, ALL C.Nerves Intact, Strength 5/5 all 4 extremities, Sensation intact all 4 extremities, Plantars down going.  4. Ears and Eyes appear Normal, Conjunctivae clear, PERRLA. Moist Oral Mucosa.  5. Supple Neck, No JVD, No cervical lymphadenopathy appriciated, No Carotid Bruits.  6. Symmetrical Chest wall movement, Good air movement bilaterally, CTAB.  7. RRR, No Gallops, Rubs or Murmurs, No Parasternal Heave.  +1 edema  8. Positive Bowel Sounds, Abdomen Soft, No tenderness, No organomegaly appriciated,No rebound -guarding or rigidity.  9.  Left fifth toe gangrenous wound, please see picture below, right transmetatarsal amputation  10. Good muscle tone,  joints appear normal , no effusions, Normal ROM.  11. No Palpable Lymph Nodes in Neck or Axillae      Data Review:    CBC Recent Labs  Lab 06/20/19 1125 06/21/19 0811  WBC 12.4* 12.0*   HGB 9.7* 9.4*  HCT 30.3* 31.4*  PLT 342 316  MCV 93 100.0  MCH 29.8 29.9  MCHC 32.0 29.9*  RDW 12.7 13.4  LYMPHSABS 1.6 1.7  MONOABS  --  0.9  EOSABS 0.3 0.3  BASOSABS 0.0 0.0   ------------------------------------------------------------------------------------------------------------------  Chemistries  Recent Labs  Lab 06/20/19 1125 06/21/19 0811  NA 139 137  K 5.9* 5.0  CL 104 107  CO2 17* 17*  GLUCOSE 90 217*  BUN 90* 95*  CREATININE 2.18* 2.07*  CALCIUM 9.3 9.1   ------------------------------------------------------------------------------------------------------------------ estimated creatinine clearance is 35.5 mL/min (A) (by C-G formula based on SCr of 2.07 mg/dL (H)). ------------------------------------------------------------------------------------------------------------------ No results for input(s): TSH, T4TOTAL, T3FREE, THYROIDAB in the last 72 hours.  Invalid input(s): FREET3  Coagulation profile No results for input(s): INR, PROTIME in the last 168 hours. ------------------------------------------------------------------------------------------------------------------- No results for input(s): DDIMER in the last 72 hours. -------------------------------------------------------------------------------------------------------------------  Cardiac Enzymes No results for input(s): CKMB, TROPONINI, MYOGLOBIN in the last 168 hours.  Invalid input(s): CK ------------------------------------------------------------------------------------------------------------------    Component Value Date/Time   BNP 511.3 (H) 11/27/2018  0519     ---------------------------------------------------------------------------------------------------------------  Urinalysis    Component Value Date/Time   COLORURINE YELLOW 03/18/2019 1540   APPEARANCEUR CLEAR 03/18/2019 1540   LABSPEC 1.013 03/18/2019 1540   PHURINE 5.0 03/18/2019 1540   GLUCOSEU NEGATIVE  03/18/2019 1540   HGBUR NEGATIVE 03/18/2019 1540   BILIRUBINUR NEGATIVE 03/18/2019 1540   KETONESUR NEGATIVE 03/18/2019 1540   PROTEINUR NEGATIVE 03/18/2019 1540   UROBILINOGEN 0.2 09/05/2015 0012   NITRITE NEGATIVE 03/18/2019 1540   LEUKOCYTESUR NEGATIVE 03/18/2019 1540    ----------------------------------------------------------------------------------------------------------------   Imaging Results:    Ct Foot Left Wo Contrast  Result Date: 06/21/2019 CLINICAL DATA:  Wound on the left little toe for 3 weeks. EXAM: CT OF THE LEFT FOOT WITHOUT CONTRAST TECHNIQUE: Multidetector CT imaging of the left foot was performed according to the standard protocol. Multiplanar CT image reconstructions were also generated. COMPARISON:  None. 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 Loss of cortical bone is also seen in the head of the proximal phalanx consistent with osteomyelitis. No other acute bony abnormality is identified. Negative for fracture or dislocation. Mild first MTP osteoarthritis noted. Ligaments Suboptimally assessed by CT. Muscles and Tendons No intramuscular fluid collection. Mild atrophy of intrinsic musculature noted. No tendon tear. Soft tissues Small amount of gas is seen in the soft tissues of the little toe. IMPRESSION: Findings consistent with osteomyelitis in the head of the proximal phalanx and throughout the middle phalanx of the little toe. 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 Negative for abscess or evidence of septic joint. Electronically Signed   By: Inge Rise M.D.   On: 06/21/2019 12:23   Dg Foot Complete Left  Result Date: 06/21/2019 CLINICAL DATA:  Wound on the left little toe.  Left foot pain. EXAM: LEFT FOOT - COMPLETE 3+ VIEW COMPARISON:  None. FINDINGS:  Soft tissues of the little toe are swollen and there is some soft tissue gas. No bony destructive change or periosteal reaction. No radiopaque foreign body. Atherosclerosis noted. No fracture or dislocation. IMPRESSION: Soft tissue gas and swelling in the little toe consistent with infection. Negative for radiopaque foreign body or evidence of osteomyelitis. Electronically Signed   By: Inge Rise M.D.   On: 06/21/2019 09:12    My personal review of EKG: Rhythm NSR, Rate  61 /min, QTc 426 , no Acute ST changes   Assessment & Plan:    Active Problems:   DM (diabetes mellitus), type 2 with peripheral vascular complications ([name])   Carotid arterial disease ([name])   Coronary artery disease   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   Chronic combined systolic and diastolic CHF (congestive heart failure) ([name])   COPD GOLD 0    CKD (chronic kidney disease), stage III ([name])   CHF (congestive heart failure) ([name])   Osteomyelitis ([name])   Left fifth toe with infected diabetic ulcer and osteomyelitis -X-ray significant for osteomyelitis, cultures were sent, she was started on vancomycin and Rocephin. -Orthopedic consult greatly appreciated, will need PAD to be addressed first, see discussion below under PAD.  History of PAD -No history of peripheral vascular disease, she is seen by Dr. Alvester Chou in the past, acquired stenting right lower extremity, she had angiograms done in the past, discussed with cardiology service, Dr. Gwenlyn Found will see patient on monday. -Continue with statin and Plavix  chronic combined diastolic  and systolic CHF - Echo [id number] with EF 45-50% (improved from40-45% 11/2016). -Appears to be a euvolemic, continue with home dose torsemide -We will hold losartan, and Aldactone given hyperkalemia  Hyperkalemia -Potassium of 5 currently, will hold Aldactone, losartan and potassium supplement  Diabetes mellitus -Lower his Lantus from 40 to 30 units during hospital stay, will continue with insulin  sliding scale moderate protocol, and 5 units of NovoLog before meals.  CKD 3  -At baseline, continue to monitor  Paroxysmal atrial fibrillation: -With normal sinus rhythm, continue with amiodarone and Eliquis  Essential hypertension - Continue with home medication, losartan on hold given hyperkalemia  Hyperlipidemia -Continue atorvastatin  Hypothyroidism: -Continue with levothyroxine  Tobacco abuse:  - Was counseled, started on nicotine patch   DVT Prophylaxis Eliquis  AM Labs Ordered, also please review Full Orders  Family Communication: Admission, patients condition and plan of care including tests being ordered have been discussed with the patient  who indicate understanding and agree with the plan and Code Status.  Code Status Full code  Likely DC to  Home  Condition GUARDED    Consults called: Orthopedic consulted, discussed with cardiology, Dr. Gwenlyn Found will see on Monday  Admission status: inpatient  Time spent in minutes : 60 minutes   Phillips Climes M.D on 06/21/2019 at 1:43 PM  Between 7am to 7pm - Pager - [phone]. After 7pm go to [email or web address] - password [email or web address]  [facility] - Office  [phone]

## 2019-06-21 NOTE — ED Notes (Addendum)
Admitting provider made aware that only 1 set of blood cultures were able to be obtained. Admitting provider ok with starting antibiotics with 1 set collected.

## 2019-06-21 NOTE — ED Notes (Signed)
Patient transported to CT 

## 2019-06-21 NOTE — Consult Note (Signed)
Reason for Consult:Left 5th toe ulcer Referring Physician: Viona Price  [name] is an 58 y.o. female.  HPI: [name] comes to the ED for evaluation of a non-healing ulcer on her left 5th toe. It's been present for about 3 weeks and is starting to hurt and smell. She thinks it might have arisen from a new pair of shoes she had made from podiatry. 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 She tells me that she has known severe PAD in both of her legs and is followed by Dr. Gwenlyn Found.  Past Medical History:  Diagnosis Date  . Alcohol abuse   . Alcoholic cirrhosis ([place])   . Anemia   . Anxiety   . Arthritis    "knees" (11/26/2018)  . B12 deficiency   . CAD (coronary artery disease)    a. cath 11/10/2014 LM nl, LAD min irregs, D1 30 ost, D2 50d, LCX 32m, OM1 80 p/m (1.5 mm vessel), OM2 31m, RCA nondom 40m-->med rx.. Demand ischemia in the setting of rapid a-fib.  . Cardiomyopathy ([place])   . Carotid artery disease ([place])    a. 01/2015 Carotid Angio: RICA 123XX123, LICA [id number]. s/p L carotid endarterectomy 02/2015.  . Cellulitis    lower extremities  . Chronic combined systolic and diastolic CHF (congestive heart failure) ([place])    a. Last echo 12/205: EF 40-45%, inferoapical/posterior HK, not technically sufficient to allow eval of LV diastolic function, normal LA in size..  . Cocaine abuse ([place])   . COPD (chronic obstructive pulmonary disease) ([place])   . Critical lower limb ischemia   . Depression   . Diabetic peripheral neuropathy ([place])   . Elevated troponin    a. Chronic elevation.  [name] GERD (gastroesophageal reflux disease)   . Hyperlipemia   . Hypertension   . Hypokalemia   . Hypomagnesemia   . Hypothyroidism   . Marijuana abuse   . Narcotic abuse ([name])   . Noncompliance   . NSVT (nonsustained ventricular tachycardia) ([place])   . Obesity   . PAF (paroxysmal atrial fibrillation) ([place])   . Paroxysmal atrial tachycardia ([place])   . Peripheral arterial disease  ([place])    a. 01/2015 Angio/PTA: LSFA 100 w/ recon @ adductor canal and 1 vessel runoff via AT, RSFA 99 (atherectomy/pta) - 1 vessel runoff via diff dzs peroneal.  . Pneumonia    "once or twice" (11/26/2018)  . Poorly controlled type 2 diabetes mellitus ([place])   . Renal insufficiency    a. 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  [name] Sleep apnea    "couldn't handle wearing the mask" (11/26/2018)  . Symptomatic bradycardia    avoid AV blocking agent per EP  . Tobacco abuse     Past Surgical History:  Procedure Laterality Date  . AMPUTATION Right 06/14/2017   Procedure: Right foot transmetatarsal amputation;  Surgeon: Newt Minion, MD;  Location: [facility];  Service: Orthopedics;  Laterality: Right;  . AMPUTATION TOE Right 04/28/2017   Procedure: AMPUTATION OF RIGHT SECOND RAY;  Surgeon: Newt Minion, MD;  Location: [facility];  Service: Orthopedics;  Laterality: Right;  . CARDIAC CATHETERIZATION    . CARDIAC CATHETERIZATION N/A 01/12/2016   Procedure: Temporary Wire;  Surgeon: Minus Breeding, MD;  Location: [facility];  Service: Cardiovascular;  Laterality: N/A;  . CARDIOVERSION  ~ 02/2013   "twice"   . CAROTID ANGIOGRAM N/A 01/15/2015   Procedure: CAROTID ANGIOGRAM;  Surgeon: [name], MD;  Location: [facility];  Service: Cardiovascular;  Laterality: N/A;  . DILATION AND CURETTAGE OF UTERUS  1988  . ENDARTERECTOMY Left 02/19/2015   Procedure: LEFT CAROTID ENDARTERECTOMY ;  Surgeon: Serafina Mitchell, MD;  Location: [facility];  Service: Vascular;  Laterality: Left;  . LEFT HEART CATHETERIZATION WITH CORONARY ANGIOGRAM N/A 10/31/2014   Procedure: LEFT HEART CATHETERIZATION WITH CORONARY ANGIOGRAM;  Surgeon: Burnell Blanks, MD;  Location: [facility]ATH LAB;  Service: Cardiovascular;  Laterality: N/A;  . LOWER EXTREMITY ANGIOGRAM N/A 09/10/2013   Procedure: LOWER EXTREMITY ANGIOGRAM;  Surgeon: [name], MD;  Location: [facility] C[facility] LAB;  Service: Cardiovascular;  Laterality: N/A;  . LOWER EXTREMITY ANGIOGRAM  N/A 01/15/2015   Procedure: LOWER EXTREMITY ANGIOGRAM;  Surgeon: [name], MD;  Location: [facility] C[facility] LAB;  Service: Cardiovascular;  Laterality: N/A;  . LOWER EXTREMITY ANGIOGRAPHY N/A 04/13/2017   Procedure: Lower Extremity Angiography;  Surgeon: [name], MD;  Location: [facility];  Service: Cardiovascular;  Laterality: N/A;  . PERIPHERAL VASCULAR INTERVENTION  04/13/2017   Procedure: Peripheral Vascular Intervention;  Surgeon: [name], MD;  Location: [facility];  Service: Cardiovascular;;    Family History  Problem Relation Age of Onset  . Hypertension Mother   . Diabetes Mother   . Cancer Mother        breast, ovarian, colon  . Clotting disorder Mother   . Heart disease Mother   . Heart attack Mother   . Breast cancer Mother        in 52's  . Hypertension Father   . Heart disease Father   . Emphysema Sister        smoked    Social History:  reports that she has been smoking e-cigarettes and cigarettes. She has a 44.00 pack-year smoking history. She has quit using smokeless tobacco.  Her smokeless tobacco use included snuff. She reports current alcohol use. She reports current drug use. Drugs: "Crack" cocaine, Marijuana, and Oxycodone.  Allergies:  Allergies  Allergen Reactions  . Gabapentin Nausea And Vomiting and Other (See Comments)    POSSIBLE SHAKING? TAKING MED CURRENTLY  . Lyrica [Pregabalin] Other (See Comments)    Shaking Pt still taking lyrica--"probably will stop taking"     Medications: I have reviewed the patient's current medications.  Results for orders placed or performed during the hospital encounter of 06/21/19 (from the past 48 hour(s))  CBC with Differential     Status: Abnormal   Collection Time: 06/21/19  8:11 AM  Result Value Ref Range   WBC 12.0 (H) 4.0 - 10.5 K/uL   RBC 3.14 (L) 3.87 - 5.11 MIL/uL   Hemoglobin 9.4 (L) 12.0 - 15.0 g/dL   HCT 31.4 (L) 36.0 - 46.0 %   MCV 100.0 80.0 - 100.0 fL   MCH 29.9 26.0  - 34.0 pg   MCHC 29.9 (L) 30.0 - 36.0 g/dL   RDW 13.4 11.5 - 15.5 %   Platelets 316 150 - 400 K/uL   nRBC 0.0 0.0 - 0.2 %   Neutrophils Relative % 75 %   Neutro Abs 9.0 (H) 1.7 - 7.7 K/uL   Lymphocytes Relative 14 %   Lymphs Abs 1.7 0.7 - 4.0 K/uL   Monocytes Relative 8 %   Monocytes Absolute 0.9 0.1 - 1.0 K/uL   Eosinophils Relative 3 %   Eosinophils Absolute 0.3 0.0 - 0.5 K/uL   Basophils Relative 0 %   Basophils Absolute 0.0 0.0 -  0.1 K/uL   Immature Granulocytes 0 %   Abs Immature Granulocytes 0.05 0.00 - 0.07 K/uL    Comment: Performed at [facility], [place] [address], [place], [place] [id number]  Basic metabolic panel     Status: Abnormal   Collection Time: 06/21/19  8:11 AM  Result Value Ref Range   Sodium 137 135 - 145 mmol/L   Potassium 5.0 3.5 - 5.1 mmol/L   Chloride 107 98 - 111 mmol/L   CO2 17 (L) 22 - 32 mmol/L   Glucose, Bld 217 (H) 70 - 99 mg/dL   BUN 95 (H) 6 - 20 mg/dL   Creatinine, Ser 2.07 (H) 0.44 - 1.00 mg/dL   Calcium 9.1 8.9 - 10.3 mg/dL   GFR calc non Af Amer 26 (L) >60 mL/min   GFR calc Af Amer 30 (L) >60 mL/min   Anion gap 13 5 - 15    Comment: Performed at [facility] [address] [place], [place] [address]    Ct Foot Left Wo Contrast  Result Date: 06/21/2019 CLINICAL DATA:  Wound on the left little toe for 3 weeks. EXAM: CT OF THE LEFT FOOT WITHOUT CONTRAST TECHNIQUE: Multidetector CT imaging of the left foot was performed according to the standard protocol. Multiplanar CT image reconstructions were also generated. COMPARISON:  None. 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 Loss of cortical bone is also seen in the head of the proximal phalanx consistent with osteomyelitis. No other acute bony abnormality is identified. Negative for fracture or dislocation. Mild first  MTP osteoarthritis noted. Ligaments Suboptimally assessed by CT. Muscles and Tendons No intramuscular fluid collection. Mild atrophy of intrinsic musculature noted. No tendon tear. Soft tissues Small amount of gas is seen in the soft tissues of the little toe. IMPRESSION: Findings consistent with osteomyelitis in the head of the proximal phalanx and throughout the middle phalanx of the little toe. 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 Negative for abscess or evidence of septic joint. Electronically Signed   By: Inge Rise M.D.   On: 06/21/2019 12:23   Dg Foot Complete Left  Result Date: 06/21/2019 CLINICAL DATA:  Wound on the left little toe.  Left foot pain. EXAM: LEFT FOOT - COMPLETE 3+ VIEW COMPARISON:  None. FINDINGS: Soft tissues of the little toe are swollen and there is some soft tissue gas. No bony destructive change or periosteal reaction. No radiopaque foreign body. Atherosclerosis noted. No fracture or dislocation. IMPRESSION: Soft tissue gas and swelling in the little toe consistent with infection. Negative for radiopaque foreign body or evidence of osteomyelitis. Electronically Signed   By: Inge Rise M.D.   On: 06/21/2019 09:12    Review of Systems  Constitutional: Negative for chills, fever and weight loss.  HENT: Negative for ear discharge, ear pain, hearing loss and tinnitus.   Eyes: Negative for blurred vision, double vision, photophobia and pain.  Respiratory: Negative for cough, sputum production and shortness of breath.   Cardiovascular: Negative for chest pain.  Gastrointestinal: Negative for abdominal pain, nausea and vomiting.  Genitourinary: Negative for dysuria, flank pain, frequency and urgency.  Musculoskeletal: Positive for joint pain (Left foot). Negative for back pain, falls, myalgias and neck pain.  Neurological: Negative for dizziness, tingling, sensory change, focal weakness, loss of consciousness and headaches.   Endo/Heme/Allergies:  Does not bruise/bleed easily.  Psychiatric/Behavioral: Negative for depression, memory loss and substance abuse. The patient is not nervous/anxious.    Blood pressure 108/87, pulse (!) 58, temperature 98.3 F (36.8 C), temperature source Oral, resp. rate 17, last menstrual period 11/27/2012, SpO2 100 %. Physical Exam  Constitutional: She appears well-developed and well-nourished. No distress.  HENT:  Head: Normocephalic and atraumatic.  Eyes: Conjunctivae are normal. Right eye exhibits no discharge. Left eye exhibits no discharge. No scleral icterus.  Neck: Normal range of motion.  Cardiovascular: Normal rate and regular rhythm.  Respiratory: Effort normal. No respiratory distress.  Musculoskeletal:     Comments: LLE No traumatic wounds, ecchymosis, or rash  Dorsal ulceration 5th toe with odor  No knee or ankle effusion  Knee stable to varus/ valgus and anterior/posterior stress  Sens DPN, TN intact, SPN paresthetic  Motor EHL, ext, flex, evers 5/5  DP 0, PT 0, No significant edema  Neurological: She is alert.  Skin: Skin is warm and dry. She is not diaphoretic.  Psychiatric: She has a normal mood and affect. Her behavior is normal.    Assessment/Plan: Left 5th toe ulceration with osteo -- Would recommend first consultation from vascular surgery or Dr. Gwenlyn Found. If she is deemed a revascularization candidate would defer treatment to them. If not then would manage her with suppressive abx and plan outpatient f/u with Dr. Sharol Given to discuss amputation. Multiple medical problems including PAD, carotid stenosis s/p left CEA, relatively mild CAD, chronic systolic CHF, paroxysmal atrial fibrillation and prior substance abuse -- per primary service    Lisette Abu, PA-C Orthopedic Surgery [phone] 06/21/2019, 1:15 PM

## 2019-06-21 NOTE — Addendum Note (Signed)
Addended by: Ulice Brilliant T on: 06/21/2019 02:51 PM   Modules accepted: Orders

## 2019-06-21 NOTE — ED Triage Notes (Signed)
Pt states that her pcp called her and told her to come to the ER for a high potassium ; pt denies any chest pain or sob ; pt c/o sore to the left little toe x 3 weeks ; pt states she goes to the foot clinic but hasnt been since her sore started ; pt denies any fever s

## 2019-06-21 NOTE — Progress Notes (Signed)
I reviewed her prior angiograms with Dr Gwenlyn Found.  He suggests the patient could possibly be a candidate for PV angiogram if her SCr improved and limb salvage was at stake.  Kerin Ransom PA-C 06/21/2019 4:30 PM

## 2019-06-21 NOTE — ED Notes (Addendum)
ED TO INPATIENT HANDOFF REPORT  ED Nurse Name and Phone #: [name] [place] Name/Age/Gender [name] 58 y.o. female Room/Bed: 037C/037C  Code Status   Code Status: Prior  Home/SNF/Other Home Patient oriented to: self, place, time and situation Is this baseline? Yes   Triage Complete: Triage complete  Chief Complaint abnormal lab  Triage Note Pt states that her pcp called her and told her to come to the ER for a high potassium ; pt denies any chest pain or sob ; pt c/o sore to the left little toe x 3 weeks ; pt states she goes to the foot clinic but hasnt been since her sore started ; pt denies any fever s   Allergies Allergies  Allergen Reactions  . Gabapentin Nausea And Vomiting and Other (See Comments)    POSSIBLE SHAKING? TAKING MED CURRENTLY  . Lyrica [Pregabalin] Other (See Comments)    Shaking Pt still taking lyrica--"probably will stop taking"     Level of Care/Admitting Diagnosis ED Disposition    ED Disposition Condition [facility]: [place] [[id number]]  Level of Care: Telemetry Medical [104]  Covid Evaluation: Asymptomatic Screening Protocol (No Symptoms)  Diagnosis: Osteomyelitis ([place]) [id number]  Admitting Physician: Manfred Shirts  Attending Physician: Waldron Labs, DAWOOD S [[id number]]  Estimated length of stay: 3 - 4 days  Certification:: I certify this patient will need inpatient services for at least 2 midnights  PT Class (Do Not Modify): Inpatient [101]  PT Acc Code (Do Not Modify): Private [1]       B Medical/Surgery History Past Medical History:  Diagnosis Date  . Alcohol abuse   . Alcoholic cirrhosis ([place])   . Anemia   . Anxiety   . Arthritis    "knees" (11/26/2018)  . B12 deficiency   . CAD (coronary artery disease)    a. cath 11/10/2014 LM nl, LAD min irregs, D1 30 ost, D2 50d, LCX 5m, OM1 80 p/m (1.5 mm vessel), OM2 21m, RCA nondom 76m-->med rx.. Demand ischemia in the setting of  rapid a-fib.  . Cardiomyopathy ([place])   . Carotid artery disease ([place])    a. 01/2015 Carotid Angio: RICA [id number], LICA [id number]. s/p L carotid endarterectomy 02/2015.  . Cellulitis    lower extremities  . Chronic combined systolic and diastolic CHF (congestive heart failure) ([place])    a. Last echo 12/205: EF 40-45%, inferoapical/posterior HK, not technically sufficient to allow eval of LV diastolic function, normal LA in size..  . Cocaine abuse ([place])   . COPD (chronic obstructive pulmonary disease) ([place])   . Critical lower limb ischemia   . Depression   . Diabetic peripheral neuropathy ([place])   . Elevated troponin    a. Chronic elevation.  [name] GERD (gastroesophageal reflux disease)   . Hyperlipemia   . Hypertension   . Hypokalemia   . Hypomagnesemia   . Hypothyroidism   . Marijuana abuse   . Narcotic abuse ([place])   . Noncompliance   . NSVT (nonsustained ventricular tachycardia) ([place])   . Obesity   . PAF (paroxysmal atrial fibrillation) ([place])   . Paroxysmal atrial tachycardia ([place])   . Peripheral arterial disease ([place])    a. 01/2015 Angio/PTA: LSFA 100 w/ recon @ adductor canal and 1 vessel runoff via AT, RSFA 99 (atherectomy/pta) - 1 vessel runoff via diff dzs peroneal.  . Pneumonia    "once or twice" (11/26/2018)  . Poorly controlled type 2 diabetes mellitus ([place])   .  Renal insufficiency    a. 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  [name] Sleep apnea    "couldn't handle wearing the mask" (11/26/2018)  . Symptomatic bradycardia    avoid AV blocking agent per EP  . Tobacco abuse    Past Surgical History:  Procedure Laterality Date  . AMPUTATION Right 06/14/2017   Procedure: Right foot transmetatarsal amputation;  Surgeon: Newt Minion, MD;  Location: [facility];  Service: Orthopedics;  Laterality: Right;  . AMPUTATION TOE Right 04/28/2017   Procedure: AMPUTATION OF RIGHT SECOND RAY;  Surgeon: Newt Minion, MD;  Location: [facility];  Service: Orthopedics;  Laterality: Right;  . CARDIAC CATHETERIZATION    . CARDIAC  CATHETERIZATION N/A 01/12/2016   Procedure: Temporary Wire;  Surgeon: Minus Breeding, MD;  Location: [facility];  Service: Cardiovascular;  Laterality: N/A;  . CARDIOVERSION  ~ 02/2013   "twice"   . CAROTID ANGIOGRAM N/A 01/15/2015   Procedure: CAROTID ANGIOGRAM;  Surgeon: [name], MD;  Location: [facility] CATH LAB;  Service: Cardiovascular;  Laterality: N/A;  . DILATION AND CURETTAGE OF UTERUS  1988  . ENDARTERECTOMY Left 02/19/2015   Procedure: LEFT CAROTID ENDARTERECTOMY ;  Surgeon: Serafina Mitchell, MD;  Location: [facility];  Service: Vascular;  Laterality: Left;  . LEFT HEART CATHETERIZATION WITH CORONARY ANGIOGRAM N/A 10/31/2014   Procedure: LEFT HEART CATHETERIZATION WITH CORONARY ANGIOGRAM;  Surgeon: Burnell Blanks, MD;  Location: [facility] CATH LAB;  Service: Cardiovascular;  Laterality: N/A;  . LOWER EXTREMITY ANGIOGRAM N/A 09/10/2013   Procedure: LOWER EXTREMITY ANGIOGRAM;  Surgeon: [name], MD;  Location: [facility] CATH LAB;  Service: Cardiovascular;  Laterality: N/A;  . LOWER EXTREMITY ANGIOGRAM N/A 01/15/2015   Procedure: LOWER EXTREMITY ANGIOGRAM;  Surgeon: [name], MD;  Location: [facility] CATH LAB;  Service: Cardiovascular;  Laterality: N/A;  . LOWER EXTREMITY ANGIOGRAPHY N/A 04/13/2017   Procedure: Lower Extremity Angiography;  Surgeon: [name], MD;  Location: [place] [facility];  Service: Cardiovascular;  Laterality: N/A;  . PERIPHERAL VASCULAR INTERVENTION  04/13/2017   Procedure: Peripheral Vascular Intervention;  Surgeon: [name], MD;  Location: [place] [facility];  Service: Cardiovascular;;     A IV Location/Drains/Wounds Patient Lines/Drains/Airways Status   Active Line/Drains/Airways    Name:   Placement date:   Placement time:   Site:   Days:   Peripheral IV 06/21/19 Right Forearm   06/21/19    0829    Forearm   less than 1   Incision (Closed) 04/28/17 Foot Right   04/28/17    1407     784   Incision (Closed) 06/14/17 Foot Right   06/14/17     1325     737   Wound / Incision (Open or Dehisced) 04/06/17 Diabetic ulcer Toe (Comment  which one) Right   04/06/17    1857    Toe (Comment  which one)   806   Wound / Incision (Open or Dehisced) 04/07/17 Diabetic ulcer Leg Right;Lower   04/07/17    0800    Leg   805   Wound / Incision (Open or Dehisced) 04/07/17 Diabetic ulcer Leg Left;Lower   04/07/17    1320    Leg   805   Wound / Incision (Open or Dehisced) 06/13/17 Diabetic ulcer Ankle Left   06/13/17    1800    Ankle   738   Wound / Incision (Open or Dehisced) 06/13/17 Diabetic ulcer Heel Left   06/13/17  1800    Heel   738   Wound / Incision (Open or Dehisced) 06/13/17 Diabetic ulcer Toe (Comment  which one) Left   06/13/17    1800    Toe (Comment  which one)   738   Wound / Incision (Open or Dehisced) 11/26/18 Other (Comment) Leg Right;Lower weeping   11/26/18    1600    Leg   207          Intake/Output Last 24 hours No intake or output data in the 24 hours ending 06/21/19 1348  Labs/Imaging Results for orders placed or performed during the hospital encounter of 06/21/19 (from the past 48 hour(s))  CBC with Differential     Status: Abnormal   Collection Time: 06/21/19  8:11 AM  Result Value Ref Range   WBC 12.0 (H) 4.0 - 10.5 K/uL   RBC 3.14 (L) 3.87 - 5.11 MIL/uL   Hemoglobin 9.4 (L) 12.0 - 15.0 g/dL   HCT 31.4 (L) 36.0 - 46.0 %   MCV 100.0 80.0 - 100.0 fL   MCH 29.9 26.0 - 34.0 pg   MCHC 29.9 (L) 30.0 - 36.0 g/dL   RDW 13.4 11.5 - 15.5 %   Platelets 316 150 - 400 K/uL   nRBC 0.0 0.0 - 0.2 %   Neutrophils Relative % 75 %   Neutro Abs 9.0 (H) 1.7 - 7.7 K/uL   Lymphocytes Relative 14 %   Lymphs Abs 1.7 0.7 - 4.0 K/uL   Monocytes Relative 8 %   Monocytes Absolute 0.9 0.1 - 1.0 K/uL   Eosinophils Relative 3 %   Eosinophils Absolute 0.3 0.0 - 0.5 K/uL   Basophils Relative 0 %   Basophils Absolute 0.0 0.0 - 0.1 K/uL   Immature Granulocytes 0 %   Abs Immature Granulocytes 0.05 0.00 - 0.07 K/uL    Comment: Performed at  [facility], [address] [place], [place] [id number]  Basic metabolic panel     Status: Abnormal   Collection Time: 06/21/19  8:11 AM  Result Value Ref Range   Sodium 137 135 - 145 mmol/L   Potassium 5.0 3.5 - 5.1 mmol/L   Chloride 107 98 - 111 mmol/L   CO2 17 (L) 22 - 32 mmol/L   Glucose, Bld 217 (H) 70 - 99 mg/dL   BUN 95 (H) 6 - 20 mg/dL   Creatinine, Ser 2.07 (H) 0.44 - 1.00 mg/dL   Calcium 9.1 8.9 - 10.3 mg/dL   GFR calc non Af Amer 26 (L) >60 mL/min   GFR calc Af Amer 30 (L) >60 mL/min   Anion gap 13 5 - 15    Comment: Performed at [facility] [address] [place], [place] [address]   Ct Foot Left Wo Contrast  Result Date: 06/21/2019 CLINICAL DATA:  Wound on the left little toe for 3 weeks. EXAM: CT OF THE LEFT FOOT WITHOUT CONTRAST TECHNIQUE: Multidetector CT imaging of the left foot was performed according to the standard protocol. Multiplanar CT image reconstructions were also generated. COMPARISON:  None. 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 Loss of cortical bone is also seen in the head of the proximal phalanx consistent with osteomyelitis. No other acute bony abnormality is identified. Negative for fracture or dislocation. Mild first MTP osteoarthritis noted. Ligaments Suboptimally assessed by CT. Muscles  and Tendons No intramuscular fluid collection. Mild atrophy of intrinsic musculature noted. No tendon tear. Soft tissues Small amount of gas is seen in the soft tissues of the little toe. IMPRESSION: Findings consistent with osteomyelitis in the head of the proximal phalanx and throughout the middle phalanx of the little toe. 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 Negative for abscess or evidence of septic joint. Electronically Signed   By: Inge Rise M.D.   On: 06/21/2019 12:23   Dg Foot Complete Left  Result Date: 06/21/2019 CLINICAL DATA:  Wound on the left little toe.  Left foot pain. EXAM: LEFT FOOT - COMPLETE 3+ VIEW COMPARISON:  None. FINDINGS: Soft tissues of the little toe are swollen and there is some soft tissue gas. No bony destructive change or periosteal reaction. No radiopaque foreign body. Atherosclerosis noted. No fracture or dislocation. IMPRESSION: Soft tissue gas and swelling in the little toe consistent with infection. Negative for radiopaque foreign body or evidence of osteomyelitis. Electronically Signed   By: Inge Rise M.D.   On: 06/21/2019 09:12    Pending Labs Unresulted Labs (From admission, onward)    Start     Ordered   06/22/19 0500  Creatinine, serum  Daily,   R     06/21/19 1314   06/21/19 1347  Hemoglobin A1c  Once,   STAT    Comments: To assess prior glycemic control    06/21/19 1347   06/21/19 1247  Culture, blood (routine x 2)  BLOOD CULTURE X 2,   STAT     06/21/19 1246   06/21/19 1239  SARS CORONAVIRUS 2 Nasal Swab Aptima Multi Swab  (Asymptomatic/Tier 2 Patients Labs)  Once,   STAT    Question Answer Comment  Is this test for diagnosis or screening Screening   Symptomatic for COVID-19 as defined by CDC No   Hospitalized for COVID-19 No   Admitted to ICU for COVID-19 No   Previously tested for COVID-19 Yes   Resident in a congregate (group) care setting No   Employed in healthcare setting No   Pregnant No      06/21/19 1239   Signed and Held  Basic metabolic panel  Tomorrow morning,   R     Signed and Held   Signed and Held  CBC  Tomorrow morning,   R     Signed and Held          Vitals/Pain Today's Vitals   06/21/19 0930 06/21/19 1030 06/21/19 1100 06/21/19 1325  BP: 107/70 101/68 108/87 126/73  Pulse:   (!) 58 (!) 57  Resp: 18 17 17 14   Temp:      TempSrc:      SpO2:  100% 100% 100%  PainSc:        Isolation Precautions No active  isolations  Medications Medications  vancomycin (VANCOCIN) 2,000 mg in sodium chloride 0.9 % 500 mL IVPB (2,000 mg Intravenous New Bag/Given 06/21/19 1334)  vancomycin (VANCOCIN) 1,250 mg in sodium chloride 0.9 % 250 mL IVPB (has no administration in time range)  cefTRIAXone (ROCEPHIN) 1 g in sodium chloride 0.9 % 100 mL IVPB (has no administration in time range)  insulin aspart (novoLOG) injection 0-15 Units (has no administration in time range)  insulin aspart (novoLOG) injection 0-5 Units (has no administration in time range)  insulin aspart (novoLOG) injection 4 Units (has no administration in time range)  nicotine (NICODERM CQ - dosed in mg/24 hours) patch 21 mg (  has no administration in time range)    Mobility walks Moderate fall risk   Focused Assessments    R Recommendations: See Admitting Provider Note  Report given to: Wayland Denis RN  Additional Notes:

## 2019-06-21 NOTE — ED Provider Notes (Signed)
[facility] EMERGENCY DEPARTMENT Provider Note   CSN: [id number] Arrival date & time: 06/21/19  0747    History   Chief Complaint Chief Complaint  Patient presents with  . Abnormal Lab    HPI Karla DEMEDEIROS is a 58 y.o. female.     The history is provided by the patient and medical records. No language interpreter was used.  Abnormal Lab  [name] is a 58 y.o. female  with an extensive past medical history as listed below including diabetes, CAD who presents to the Emergency Department by recommendation of cardiology clinic for hyperkalemia at labs drawn at office visit yesterday.  She is not on any potassium supplements or medications which would cause hyperkalemia.  She denies any chest pain or shortness of breath.  Her only complaint today is pain to left fifth toe from a nonhealing ulcer.  She states it has been ongoing for about 3 weeks now, last week, started having an odor to it and looked much worse.  She has not been on any antibiotics for such.  She states that her blood sugars have been running in about the 200 range.  Denies any fever or chills.  Per chart review, cardiology PA recommended that she come to the ER for stat potassium repeat to ensure accuracy.  Past Medical History:  Diagnosis Date  . Alcohol abuse   . Alcoholic cirrhosis ([place])   . Anemia   . Anxiety   . Arthritis    "knees" (11/26/2018)  . B12 deficiency   . CAD (coronary artery disease)    a. cath 11/10/2014 LM nl, LAD min irregs, D1 30 ost, D2 50d, LCX 23m, OM1 80 p/m (1.5 mm vessel), OM2 74m, RCA nondom 41m-->med rx.. Demand ischemia in the setting of rapid a-fib.  . Cardiomyopathy ([place])   . Carotid artery disease ([place])    a. 01/2015 Carotid Angio: RICA [id number], LICA [id number]. s/p L carotid endarterectomy 02/2015.  . Cellulitis    lower extremities  . Chronic combined systolic and diastolic CHF (congestive heart failure) ([place])    a. Last echo 12/205: EF 40-45%, inferoapical/posterior  HK, not technically sufficient to allow eval of LV diastolic function, normal LA in size..  . Cocaine abuse ([place])   . COPD (chronic obstructive pulmonary disease) ([place])   . Critical lower limb ischemia   . Depression   . Diabetic peripheral neuropathy ([place])   . Elevated troponin    a. Chronic elevation.  [name] GERD (gastroesophageal reflux disease)   . Hyperlipemia   . Hypertension   . Hypokalemia   . Hypomagnesemia   . Hypothyroidism   . Marijuana abuse   . Narcotic abuse ([place])   . Noncompliance   . NSVT (nonsustained ventricular tachycardia) ([place])   . Obesity   . PAF (paroxysmal atrial fibrillation) ([place])   . Paroxysmal atrial tachycardia ([place])   . Peripheral arterial disease ([place])    a. 01/2015 Angio/PTA: LSFA 100 w/ recon @ adductor canal and 1 vessel runoff via AT, RSFA 99 (atherectomy/pta) - 1 vessel runoff via diff dzs peroneal.  . Pneumonia    "once or twice" (11/26/2018)  . Poorly controlled type 2 diabetes mellitus ([place])   . Renal insufficiency    a. 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  [name] Sleep apnea    "couldn't handle wearing the mask" (11/26/2018)  . Symptomatic bradycardia    avoid AV blocking agent per EP  . Tobacco abuse     Patient Active Problem List  Diagnosis Date Noted  . NICM (nonischemic cardiomyopathy) ([place]) 06/20/2019  . Non-healing ulcer ([place]) 06/20/2019  . Acute CHF (congestive heart failure) ([place]) 11/26/2018  . CHF (congestive heart failure) ([place]) 11/26/2018  . Acute respiratory failure ([place]) 10/21/2018  . AKI (acute kidney injury) ([place]) 08/18/2018  . Coagulation disorder ([place]) 08/09/2017  . Depression 07/21/2017  . At risk for adverse drug reaction 06/20/2017  . Peripheral neuropathy 06/20/2017  . Acute osteomyelitis of right foot ([place]) 06/13/2017  . S/P transmetatarsal amputation of foot, right ([place]) 06/05/2017  . 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 PAD (peripheral artery disease)  ([place])   . SIRS (systemic inflammatory response syndrome) ([place]) 04/06/2017  . CKD (chronic kidney disease), stage III ([place]) 11/24/2016  . Suspected sleep apnea 11/24/2016  . Ulcer of toe of right foot, with necrosis of bone ([place]) 10/27/2016  . Ulcer of left lower leg ([place]) 05/19/2016  . Severe obesity (BMI >= 40) ([place]) 02/24/2016  . COPD GOLD 0  02/24/2016  . Encounter for therapeutic drug monitoring 02/10/2016  . Symptomatic bradycardia 01/12/2016  . Essential hypertension 12/22/2015  . Chronic combined systolic and diastolic CHF (congestive heart failure) ([place])   . Wheeze   . 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 Tobacco abuse 10/23/2015  . Coronary artery disease   . DOE (dyspnea on exertion) 04/29/2015  . Diabetes mellitus type 2, uncontrolled ([place]) 02/08/2015  . Influenza A 02/07/2015  . Wrist fracture, left, with routine healing, subsequent encounter 02/05/2015  . Wrist fracture, left, closed, initial encounter 01/29/2015  . PAF (paroxysmal atrial fibrillation) ([place]) 01/16/2015  . Carotid arterial disease ([place]) 01/16/2015  . Claudication ([place]) 01/15/2015  . Demand ischemia ([place]) 10/29/2014  . Insomnia 02/03/2014  . S/P peripheral artery angioplasty - [place]bo[place] atherectomy; R SFA 09/11/2013    Class: Acute  . Leg pain, bilateral 08/19/2013  . Hypothyroidism 07/31/2013  . Cellulitis 06/13/2013  . History of cocaine abuse ([place]) 06/13/2013  . Long term current use of anticoagulant therapy 05/20/2013  . Alcohol abuse   . Narcotic abuse ([place])   . Marijuana abuse   . Alcoholic cirrhosis ([place])   . DM (diabetes mellitus), type 2 with peripheral vascular complications [facility])     Past Surgical History:  Procedure Laterality Date  . AMPUTATION Right 06/14/2017   Procedure: Right foot transmetatarsal amputation;  Surgeon: Newt Minion, MD;  Location: [facility];  Service: Orthopedics;  Laterality: Right;  . AMPUTATION TOE Right 04/28/2017   Procedure: AMPUTATION OF RIGHT SECOND RAY;   Surgeon: Newt Minion, MD;  Location: [facility];  Service: Orthopedics;  Laterality: Right;  . CARDIAC CATHETERIZATION    . CARDIAC CATHETERIZATION N/A 01/12/2016   Procedure: Temporary Wire;  Surgeon: Minus Breeding, MD;  Location: [facility] CV LAB;  Service: Cardiovascular;  Laterality: N/A;  . CARDIOVERSION  ~ 02/2013   "twice"   . CAROTID ANGIOGRAM N/A 01/15/2015   Procedure: CAROTID ANGIOGRAM;  Surgeon: [name], MD;  Location: [facility] CATH LAB;  Service: Cardiovascular;  Laterality: N/A;  . DILATION AND CURETTAGE OF UTERUS  1988  . ENDARTERECTOMY Left 02/19/2015   Procedure: LEFT CAROTID ENDARTERECTOMY ;  Surgeon: Serafina Mitchell, MD;  Location: [facility];  Service: Vascular;  Laterality: Left;  . LEFT HEART CATHETERIZATION WITH CORONARY ANGIOGRAM N/A 10/31/2014   Procedure: LEFT HEART CATHETERIZATION WITH CORONARY ANGIOGRAM;  Surgeon: Burnell Blanks, MD;  Location[facility] C[facility] LAB;  Service: Cardiovascular;  Laterality: N/A;  . LOWER EXTREMITY ANGIOGRAM N/A 09/10/2013   Procedure: LOWER EXTREMITY ANGIOGRAM;  Surgeon: [name], MD;  [facility] CATH LAB;  Service: Cardiovascular;  Laterality: N/A;  . LOWER EXTREMITY ANGIOGRAM N/A 01/15/2015   Procedure: LOWER EXTREMITY ANGIOGRAM;  Surgeon: [name], MD;  Location: [facility] CATH LAB;  Service: Cardiovascular;  Laterality: N/A;  . LOWER EXTREMITY ANGIOGRAPHY N/A 04/13/2017   Procedure: Lower Extremity Angiography;  Surgeon: [name], MD;  Location: [facility] CV LAB;  Service: Cardiovascular;  Laterality: N/A;  . PERIPHERAL VASCULAR INTERVENTION  04/13/2017   Procedure: Peripheral Vascular Intervention;  Surgeon: [name], MD;  Location: [facility] CV LAB;  Service: Cardiovascular;;     OB History    Gravida  1   Para  1   Term  1   Preterm      AB      Living  1     SAB      TAB      Ectopic      Multiple      Live Births               Home Medications    Prior to Admission  medications   Medication Sig Start Date End Date Taking? Authorizing Provider  acetaminophen (TYLENOL) 500 MG tablet Take 1,000 mg by mouth every 6 (six) hours as needed for mild pain.   Yes [provider]  albuterol (PROVENTIL HFA;VENTOLIN HFA) 108 (90 Base) MCG/ACT inhaler Inhale 2 puffs into the lungs every 6 (six) hours as needed for wheezing or shortness of breath. 08/09/17  Yes Medina-Vargas, Monina C, NP  amiodarone (PACERONE) 100 MG tablet TAKE 1 TABLET (100 MG TOTAL) BY MOUTH DAILY Patient taking differently: Take 100 mg by mouth daily.  09/20/18  Yes Larey Dresser, MD  amLODipine (NORVASC) 10 MG tablet TAKE 1 TABLET (10 MG TOTAL) BY MOUTH DAILY. 05/01/19  Yes Larey Dresser, MD  apixaban (ELIQUIS) 5 MG TABS tablet Take 1 tablet (5 mg total) by mouth 2 (two) times daily. 08/09/17  Yes Medina-Vargas, Monina C, NP  Aspirin-Caffeine (BC FAST PAIN RELIEF PO) Take 1 Package by mouth as needed (headache).   Yes [provider]  atorvastatin (LIPITOR) 40 MG tablet TAKE 1 TABLET BY MOUTH ONCE EVERY EVENING Patient taking differently: Take 40 mg by mouth daily. TAKE 1 TABLET BY MOUTH ONCE EVERY EVENING 08/09/17  Yes Medina-Vargas, Monina C, NP  bisoprolol (ZEBETA) 5 MG tablet Take 1 tablet (5 mg total) by mouth daily. 05/01/19  Yes Larey Dresser, MD  cholecalciferol (VITAMIN D) 1000 units tablet Take 1,000 Units by mouth daily.    Yes [provider]  clopidogrel (PLAVIX) 75 MG tablet Take 75 mg by mouth daily.   Yes [provider]  folic acid (FOLVITE) 1 MG tablet Take 1 tablet (1 mg total) by mouth daily. Patient taking differently: Take 1 mg by mouth daily.  10/25/18  Yes Kayleen Memos, DO  Insulin Glargine (LANTUS SOLOSTAR) 100 UNIT/ML Solostar Pen Inject 40 Units into the skin at bedtime.    Yes [provider]  Insulin Lispro (HUMALOG KWIKPEN) 200 UNIT/ML SOPN Inject 15 Units into the skin 3 (three) times daily with meals.    Yes [provider]  levothyroxine (SYNTHROID, LEVOTHROID) 50 MCG tablet Take 1 tablet (50 mcg total) by mouth daily. 08/09/17  Yes Medina-Vargas, Monina C, NP  losartan (COZAAR) 100 MG tablet TAKE  1 TABLET (100 MG TOTAL) BY MOUTH DAILY. 04/02/19  Yes Bensimhon, Shaune Pascal, MD  metolazone (ZAROXOLYN) 2.5 MG tablet Take 1 tablet (2.5 mg total) by mouth once a week. On Thursdays 01/30/19  Yes Tillery, Satira Mccallum, PA-C  Multiple Vitamin (MULTIVITAMIN WITH MINERALS) TABS tablet Take 1 tablet by mouth daily. 10/25/18  Yes Irene Pap N, DO  potassium chloride SA (K-DUR) 20 MEQ tablet Take 2 tablets (40 mEq total) by mouth daily. 05/01/19  Yes Larey Dresser, MD  spironolactone (ALDACTONE) 25 MG tablet Take 1 tablet (25 mg total) by mouth daily. 05/01/19  Yes Larey Dresser, MD  thiamine 100 MG tablet Take 1 tablet (100 mg total) by mouth daily. 10/25/18  Yes Kayleen Memos, DO  torsemide (DEMADEX) 20 MG tablet Take 2 tablets (40 mg total) by mouth 2 (two) times daily. 01/30/19  Yes Shirley Friar, PA-C  ibuprofen (ADVIL) 600 MG tablet Take 1 tablet (600 mg total) by mouth every 6 (six) hours as needed. 03/25/19   Charlann Lange, PA-C    Family History Family History  Problem Relation Age of Onset  . Hypertension Mother   . Diabetes Mother   . Cancer Mother        breast, ovarian, colon  . Clotting disorder Mother   . Heart disease Mother   . Heart attack Mother   . Breast cancer Mother        in 49's  . Hypertension Father   . Heart disease Father   . Emphysema Sister        smoked    Social History Social History   Tobacco Use  . Smoking status: Current Some Day Smoker    Packs/day: 1.00    Years: 44.00    Pack years: 44.00    Types: E-cigarettes, Cigarettes  . Smokeless tobacco: Former Systems developer    Types: Snuff  Substance Use Topics  . Alcohol use: Yes    Comment: 11/26/2018 "couple drinks/month maybe"  . Drug use: Yes    Types: "Crack" cocaine, Marijuana, Oxycodone     Comment: last used 3 days ago      Allergies   Gabapentin and Lyrica [pregabalin]   Review of Systems Review of Systems  Musculoskeletal: Positive for arthralgias.  Skin: Positive for wound.  All other systems reviewed and are negative.    Physical Exam Updated Vital Signs BP 108/87 (BP Location: Left Arm)   Pulse (!) 58   Temp 98.3 F (36.8 C) (Oral)   Resp 17   LMP 11/27/2012   SpO2 100%   Physical Exam Vitals signs and nursing note reviewed.  Constitutional:      General: She is not in acute distress.    Appearance: She is well-developed.  HENT:     Head: Normocephalic and atraumatic.  Neck:     Musculoskeletal: Neck supple.  Cardiovascular:     Rate and Rhythm: Regular rhythm.     Heart sounds: Normal heart sounds. No murmur.  Pulmonary:     Effort: Pulmonary effort is normal. No respiratory distress.     Breath sounds: Normal breath sounds.  Abdominal:     General: There is no distension.     Palpations: Abdomen is soft.     Tenderness: There is no abdominal tenderness.  Skin:    General: Skin is warm and dry.     Comments: See image below of left foot.  Does have clear drainage with odor.  Neurological:  Mental Status: She is alert and oriented to person, place, and time.        ED Treatments / Results  Labs (all labs ordered are listed, but only abnormal results are displayed) Labs Reviewed  CBC WITH DIFFERENTIAL/PLATELET - Abnormal; Notable for the following components:      Result Value   WBC 12.0 (*)    RBC 3.14 (*)    Hemoglobin 9.4 (*)    HCT 31.4 (*)    MCHC 29.9 (*)    Neutro Abs 9.0 (*)    All other components within normal limits  BASIC METABOLIC PANEL - Abnormal; Notable for the following components:   CO2 17 (*)    Glucose, Bld 217 (*)    BUN 95 (*)    Creatinine, Ser 2.07 (*)    GFR calc non Af Amer 26 (*)    GFR calc Af Amer 30 (*)    All other components within normal limits  SARS CORONAVIRUS 2  CULTURE, BLOOD  (ROUTINE X 2)  CULTURE, BLOOD (ROUTINE X 2)    EKG EKG Interpretation  Date/Time:  Friday June 21 2019 08:03:56 EDT Ventricular Rate:  61 PR Interval:    QRS Duration: 98 QT Interval:  422 QTC Calculation: 426 R Axis:   41 Text Interpretation:  Sinus or ectopic atrial rhythm Low voltage, precordial leads Anteroseptal infarct, old Confirmed by Gerlene Fee [id number]) on 06/21/2019 8:24:47 AM   Radiology Ct Foot Left Wo Contrast  Result Date: 06/21/2019 CLINICAL DATA:  Wound on the left little toe for 3 weeks. EXAM: CT OF THE LEFT FOOT WITHOUT CONTRAST TECHNIQUE: Multidetector CT imaging of the left foot was performed according to the standard protocol. Multiplanar CT image reconstructions were also generated. COMPARISON:  None. 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 Loss of cortical bone is also seen in the head of the proximal phalanx consistent with osteomyelitis. No other acute bony abnormality is identified. Negative for fracture or dislocation. Mild first MTP osteoarthritis noted. Ligaments Suboptimally assessed by CT. Muscles and Tendons No intramuscular fluid collection. Mild atrophy of intrinsic musculature noted. No tendon tear. Soft tissues Small amount of gas is seen in the soft tissues of the little toe. IMPRESSION: Findings consistent with osteomyelitis in the head of the proximal phalanx and throughout the middle phalanx of the little toe. 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 Negative for abscess or evidence of septic joint. Electronically Signed   By: Inge Rise M.D.   On: 06/21/2019 12:23   Dg Foot Complete Left  Result Date: 06/21/2019 CLINICAL DATA:  Wound on the left little toe.  Left foot pain. EXAM: LEFT FOOT - COMPLETE 3+ VIEW COMPARISON:  None. FINDINGS: Soft  tissues of the little toe are swollen and there is some soft tissue gas. No bony destructive change or periosteal reaction. No radiopaque foreign body. Atherosclerosis noted. No fracture or dislocation. IMPRESSION: Soft tissue gas and swelling in the little toe consistent with infection. Negative for radiopaque foreign body or evidence of osteomyelitis. Electronically Signed   By: Inge Rise M.D.   On: 06/21/2019 09:12    Procedures Procedures (including critical care time)  Medications Ordered in ED Medications - No data to display   Initial Impression / Assessment and Plan / ED Course  I have reviewed the triage vital signs and  the nursing notes.  Pertinent labs & imaging results that were available during my care of the patient were reviewed by me and considered in my medical decision making (see chart for details).       [name] is a 59 y.o. female who presents to ED at recommendation of cardiologist for hyperkalemia noted on routine lab work in clinic yesterday.  Today on repeat, her potassium is normal.  She has no medical conditions or on any medications that would induce hyperkalemia.  Suspect a degree of hemolysis or a lab error in sample from yesterday.  She additionally complains of wounds to the left toe. Hx of uncontrolled DM with prior amputations to the right foot.  Leukocytosis of 12.  CT confirming osteomyelitis in the head of the proximal phalanx and throughout the middle phalanx of the fifth digit.  Discussed with orthopedics, Hilbert Odor, who will evaluate patient.  Blood cultures obtained.  Started on vanc.  Hospitalist consulted who will admit.  Patient seen by and discussed with Dr. Sedonia Small who agrees with treatment plan.    Final Clinical Impressions(s) / ED Diagnoses   Final diagnoses:  Osteomyelitis of left foot, unspecified type [facility])    ED Discharge Orders    None       Jennavieve Arrick, Ozella Almond, PA-C 06/21/19 1305    Maudie Flakes, MD  06/24/19 [phone]

## 2019-06-21 NOTE — ED Notes (Signed)
Pt ambulated to the bathroom with a cane.

## 2019-06-21 NOTE — Progress Notes (Signed)
Pharmacy Antibiotic Note  [name] is a 58 y.o. female admitted on 06/21/2019 with hyperkalemia. Non-healing ulcer on left fifth toe with osteomyelitis findings from CT foot on 8/7. Pt has not taken abx for this ulcer PTA. Pharmacy has been consulted for vancomycin dosing.  Afebrile, Tmax 98.3. WBC elevated 12.0. Scr 2.07 near baseline (stage III CKD).   Plan: Vancomycin 2 gm IV loading dose Vancomycin 1250 mg IV Q 48 hrs. Goal AUC 400-550. Expected AUC: 464.1 SCr used: 2.07 Monitor cxs, renal fxn, clinical improvement, and vanc levels at steady state.     Temp (24hrs), Avg:98.3 F (36.8 C), Min:98.3 F (36.8 C), Max:98.3 F (36.8 C)  Recent Labs  Lab 06/20/19 1125 06/21/19 0811  WBC 12.4* 12.0*  CREATININE 2.18* 2.07*    Estimated Creatinine Clearance: 35.5 mL/min (A) (by C-G formula based on SCr of 2.07 mg/dL (H)).    Allergies  Allergen Reactions  . Gabapentin Nausea And Vomiting and Other (See Comments)    POSSIBLE SHAKING? TAKING MED CURRENTLY  . Lyrica [Pregabalin] Other (See Comments)    Shaking Pt still taking lyrica--"probably will stop taking"     Antimicrobials this admission: 8/7 vanc >>  Microbiology results: 8/7 BCx: sent  Thank you for allowing pharmacy to be a part of this patient's care.  Berenice Bouton, PharmD PGY1 Pharmacy Resident Office phone: [phone]  06/21/2019 1:01 PM

## 2019-06-22 DIAGNOSIS — J449 Chronic obstructive pulmonary disease, unspecified: Secondary | ICD-10-CM

## 2019-06-22 DIAGNOSIS — I5042 Chronic combined systolic (congestive) and diastolic (congestive) heart failure: Secondary | ICD-10-CM

## 2019-06-22 LAB — CBC
HCT: 31 % — ABNORMAL LOW (ref 36.0–46.0)
Hemoglobin: 9.7 g/dL — ABNORMAL LOW (ref 12.0–15.0)
MCH: 30 pg (ref 26.0–34.0)
MCHC: 31.3 g/dL (ref 30.0–36.0)
MCV: 96 fL (ref 80.0–100.0)
Platelets: 297 10*3/uL (ref 150–400)
RBC: 3.23 MIL/uL — ABNORMAL LOW (ref 3.87–5.11)
RDW: 13.2 % (ref 11.5–15.5)
WBC: 9.4 10*3/uL (ref 4.0–10.5)
nRBC: 0 % (ref 0.0–0.2)

## 2019-06-22 LAB — BASIC METABOLIC PANEL
Anion gap: 14 (ref 5–15)
BUN: 88 mg/dL — ABNORMAL HIGH (ref 6–20)
CO2: 22 mmol/L (ref 22–32)
Calcium: 9 mg/dL (ref 8.9–10.3)
Chloride: 102 mmol/L (ref 98–111)
Creatinine, Ser: 1.82 mg/dL — ABNORMAL HIGH (ref 0.44–1.00)
GFR calc Af Amer: 35 mL/min — ABNORMAL LOW (ref >60)
GFR calc non Af Amer: 31 mL/min — ABNORMAL LOW (ref >60)
Glucose, Bld: 180 mg/dL — ABNORMAL HIGH (ref 70–99)
Potassium: 4.5 mmol/L (ref 3.5–5.1)
Sodium: 138 mmol/L (ref 135–145)

## 2019-06-22 LAB — SURGICAL PCR SCREEN
MRSA, PCR: POSITIVE — AB
Staphylococcus aureus: POSITIVE — AB

## 2019-06-22 LAB — GLUCOSE, CAPILLARY
Glucose-Capillary: 193 mg/dL — ABNORMAL HIGH (ref 70–99)
Glucose-Capillary: 137 mg/dL — ABNORMAL HIGH (ref 70–99)
Glucose-Capillary: 165 mg/dL — ABNORMAL HIGH (ref 70–99)
Glucose-Capillary: 135 mg/dL — ABNORMAL HIGH (ref 70–99)

## 2019-06-22 MED ORDER — CHLORHEXIDINE GLUCONATE CLOTH 2 % EX PADS
6.0000 | MEDICATED_PAD | Freq: Every day | CUTANEOUS | Status: AC
  Administered 2019-06-23 – 2019-06-27 (×5): 6 via TOPICAL

## 2019-06-22 MED ORDER — VANCOMYCIN HCL 10 G IV SOLR
1500.0000 mg | INTRAVENOUS | Status: DC
  Administered 2019-06-23 – 2019-06-25 (×2): 1500 mg via INTRAVENOUS
  Filled 2019-06-22 (×2): qty 1500

## 2019-06-22 MED ORDER — MUPIROCIN 2 % EX OINT
1.0000 "application " | TOPICAL_OINTMENT | Freq: Two times a day (BID) | CUTANEOUS | Status: AC
  Administered 2019-06-22 – 2019-06-27 (×9): 1 via NASAL
  Filled 2019-06-22 (×2): qty 22

## 2019-06-22 NOTE — Progress Notes (Signed)
Pharmacy Antibiotic Note  [name] is a 58 y.o. female admitted on 06/21/2019 with hyperkalemia. Non-healing ulcer on left fifth toe with osteomyelitis findings from CT foot on 8/7. Pt has not taken abx for this ulcer PTA. Pharmacy has been consulted for vancomycin dosing.  Afebrile, Tmax 98.3. WBC elevated 12.0. Scr 2.07 near baseline (stage III CKD).   Plan: CTX 2gm q24 Vancomycin 2 gm IV x1, then 1500 mg q48 AUC: 499, SCr 1.82, Vd 0.5 Trend Scr, adjusted for 8/9 dose    Temp (24hrs), Avg:98.4 F (36.9 C), Min:98 F (36.7 C), Max:98.6 F (37 C)  Recent Labs  Lab 06/20/19 1125 06/21/19 0811 06/22/19 0302  WBC 12.4* 12.0* 9.4  CREATININE 2.18* 2.07* 1.82*    Estimated Creatinine Clearance: 40.3 mL/min (A) (by C-G formula based on SCr of 1.82 mg/dL (H)).    Allergies  Allergen Reactions  . Gabapentin Nausea And Vomiting and Other (See Comments)    POSSIBLE SHAKING? TAKING MED CURRENTLY  . Lyrica [Pregabalin] Other (See Comments)    Shaking Pt still taking lyrica--"probably will stop taking"     Antimicrobials this admission: 8/7 vanc >> 8/7 CTX >>  Dose Adj:  8/8 Vanc 1250 mg q48 > AUC: 464.1, SCr 2.07 > SCr 1.82  Microbiology results: 8/7 BCx: ngtd 8/7 Surg PCR: pos/pos    Thank you for allowing pharmacy to be a part of this patient's care.  Berenice Bouton, PharmD PGY1 Pharmacy Resident Office phone: [phone]  06/22/2019 1:14 PM

## 2019-06-22 NOTE — Evaluation (Signed)
Physical Therapy Evaluation Patient Details Name: [name] MRN: [id number] DOB: 1961-01-21 Today's Date: 06/22/2019   History of Present Illness  Pt is a 58 y/o female admitted secondary to L 5th toe osteomyelitis. PMH including but not limited to   Clinical Impression  Pt presented sitting up in recliner chair, awake and willing to participate in therapy session. Prior to admission, pt reported that she ambulates with use of a cane and is independent with ADLs. Pt lives with her cousin in a two level home with several steps to enter. At the time of evaluation, pt limited secondary to significant LE pain. Pt at supervision level for transfers and ambulation with RW in room. PT will continue to follow acutely to progress mobility as tolerated and to ensure a safe d/c home.     Follow Up Recommendations No PT follow up    Equipment Recommendations  None recommended by PT    Recommendations for Other Services       Precautions / Restrictions Precautions Precautions: Fall Restrictions Weight Bearing Restrictions: No      Mobility  Bed Mobility               General bed mobility comments: pt OOB in recliner chair upon arrival  Transfers Overall transfer level: Needs assistance Equipment used: Rolling walker (2 wheeled) Transfers: Sit to/from Stand Sit to Stand: Supervision         General transfer comment: for safety, good technique  Ambulation/Gait Ambulation/Gait assistance: Supervision Gait Distance (Feet): 40 Feet Assistive device: Rolling walker (2 wheeled) Gait Pattern/deviations: Decreased stride length Gait velocity: decreased   General Gait Details: pt limited in distance secondary to pain; pt very steady with use of RW, no LOB or need for physical assistance  Stairs            Wheelchair Mobility    Modified Rankin (Stroke Patients Only)       Balance Overall balance assessment: Needs assistance Sitting-balance support: Feet  supported Sitting balance-Leahy Scale: Good     Standing balance support: During functional activity Standing balance-Leahy Scale: Fair                               Pertinent Vitals/Pain Pain Assessment: 0-10 Pain Score: 10-Worst pain ever Pain Location: bilateral feet Pain Descriptors / Indicators: Sharp;Pins and needles Pain Intervention(s): Monitored during session;Repositioned    Home Living Family/patient expects to be discharged to:: Private residence Living Arrangements: Other relatives Available Help at Discharge: Family;Available 24 hours/day Type of Home: House Home Access: Stairs to enter Entrance Stairs-Rails: Psychiatric nurse of Steps: 7 Home Layout: Two level Home Equipment: Walker - 2 wheels;Walker - 4 wheels;Cane - quad;Shower seat;Wheelchair - manual      Prior Function Level of Independence: Independent with assistive device(s)         Comments: ambulates with use of a cane, independent with ADLs.     Hand Dominance        Extremity/Trunk Assessment   Upper Extremity Assessment Upper Extremity Assessment: Overall WFL for tasks assessed    Lower Extremity Assessment Lower Extremity Assessment: Overall WFL for tasks assessed       Communication   Communication: No difficulties  Cognition Arousal/Alertness: Awake/alert Behavior During Therapy: WFL for tasks assessed/performed Overall Cognitive Status: Within Functional Limits for tasks assessed  General Comments      Exercises     Assessment/Plan    PT Assessment Patient needs continued PT services  PT Problem List Decreased activity tolerance;Decreased balance;Decreased mobility;Decreased coordination;Decreased knowledge of precautions;Pain       PT Treatment Interventions DME instruction;Gait training;Functional mobility training;Therapeutic activities;Stair training;Therapeutic exercise;Balance  training;Neuromuscular re-education;Patient/family education    PT Goals (Current goals can be found in the Care Plan section)  Acute Rehab PT Goals Patient Stated Goal: decrease pain PT Goal Formulation: With patient Time For Goal Achievement: 07/06/19 Potential to Achieve Goals: Good    Frequency Min 3X/week   Barriers to discharge        Co-evaluation               AM-PAC PT "6 Clicks" Mobility  Outcome Measure Help needed turning from your back to your side while in a flat bed without using bedrails?: None Help needed moving from lying on your back to sitting on the side of a flat bed without using bedrails?: None Help needed moving to and from a bed to a chair (including a wheelchair)?: None Help needed standing up from a chair using your arms (e.g., wheelchair or bedside chair)?: None Help needed to walk in hospital room?: None Help needed climbing 3-5 steps with a railing? : A Little 6 Click Score: 23    End of Session   Activity Tolerance: Patient limited by pain Patient left: in chair;with call bell/phone within reach Nurse Communication: Mobility status PT Visit Diagnosis: Other abnormalities of gait and mobility (R26.89);Pain Pain - Right/Left: (bilateral) Pain - part of body: Ankle and joints of foot    Time: [id number] PT Time Calculation (min) (ACUTE ONLY): 16 min   Charges:   PT Evaluation $PT Eval Moderate Complexity: 1 Mod          Sherie Don, PT, DPT  Acute Rehabilitation Services Pager [phone] Office [place] 06/22/2019, 1:01 PM

## 2019-06-22 NOTE — Progress Notes (Addendum)
Progress Note   Patient Demographics:    [name], is a 58 y.o. female  MRN: [id number]   DOB - 12-Oct-1961  Admit Date - 06/21/2019  Outpatient Primary MD for the patient is Tsosie Billing, MD (Inactive)  Outpatient Specialists: Patient been followed by Dr. Alvester Chou in the past regarding PVD of bilateral lower extremity with multiple angiograms and stent placed, she did see vascular Dr. Trula Slade for carotid stenosis, she did see Dr. Sharol Given in the past for right transmetatarsal amputation.  Patient coming from: home  Chief Complaint  Patient presents with   Abnormal Lab      HPI:    [name]  is a 58 y.o. female, with past medical history of diabetes mellitus, tobacco abuse, still smoking, CAD, peripheral vascular disease s/p angiogram and stent by Dr. Gwenlyn Found, A. Fib on Eliquis, COPD, cirrhosis, OSA, CKD stage III, hypertension. She presents to ED secondary to nonhealing left fifth toe wounds, as well sent by her PCP for hyperkalemia, apparently patient had potassium of 5.9 yesterday at PCP office, she was asked to come to ED, in ED potassium was 5, she is on Aldactone, losartan and potassium supplement, as well she was noted to have left fifth toe nonhealing ulcer, with foul-smelling odor, report is been for last 3 weeks, she has not been given any antibiotics, denies fever, chills, shortness of breath. X-ray significant for osteomyelitis of left fifth toe, potassium of 5, and reddening of 2, white blood cell of 12,000, hospitalist called to admit.    Subjective    No acute issues or events overnight, pain moderately well controlled, minimal bleeding from bandage site, denies chest pain, shortness of breath, nausea, vomiting, diarrhea, constipation, headache, fever, chills.    Assessment & Plan:  Active Problems:   DM (diabetes mellitus), type 2 with peripheral vascular complications ([name])   Carotid  arterial disease ([name])   Coronary artery disease   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   Chronic combined systolic and diastolic CHF (congestive heart failure) ([name])   COPD GOLD 0    CKD (chronic kidney disease), stage III ([name])   CHF (congestive heart failure) ([name])   Osteomyelitis ([name])   Left fifth toe with infected diabetic ulcer and osteomyelitis -X-ray significant for osteomyelitis -Cultures pending -Continue vancomycin and Rocephin. -Orthopedic consult greatly appreciated, will need PAD to be addressed first - as below  PAD/PVD -Known history of peripheral vascular disease -Previously seen by Dr. Alvester Chou in the past, acquired stenting right lower extremity, she had angiograms done in the past, discussed with cardiology service, Dr. Gwenlyn Found will see patient on Monday 06/24/19 -Continue with statin, eliquis, plavix for now  Chronic combined diastolic and systolic CHF, not in acute exacerbation -Echo 10/2018 with EF 45-50% (improved from40-45% 11/2016). -Appears to be a euvolemic, continue with home dose torsemide -We will hold losartan, and Aldactone given hyperkalemia; albeit mild  Hyperkalemia,mild, resolved -Potassium of 5 currently, will hold Aldactone, losartan and potassium supplement -If downtrending further in the morning would restart home meds   Diabetes mellitus, insulin dependent, moderately  well controlled -Continue lower dose lantus at 30 units during hospital stay, will continue with insulin sliding scale moderate protocol, and 5 units of NovoLog before meals.  CKD 3, stable, at baseline  -At baseline, continue to monitor  Paroxysmal atrial fibrillation: -With normal sinus rhythm, continue with amiodarone and Eliquis  Essential hypertension - Continue with home medication, losartan on hold given hyperkalemia  Hyperlipidemia -Continue atorvastatin  Hypothyroidism: -Continue with levothyroxine  Tobacco abuse:  - Was counseled, started on nicotine patch   DVT  Prophylaxis Eliquis  Code Status Full code Likely DC to  Home Condition GUARDED   Consults called: Orthopedic consulted, discussed with cardiology, Dr. Gwenlyn Found will see on Monday Admission status: inpatient Time spent in minutes : 35 minutes   Home Medications:   Prior to Admission medications   Medication Sig Start Date End Date Taking? Authorizing Provider  acetaminophen (TYLENOL) 500 MG tablet Take 1,000 mg by mouth every 6 (six) hours as needed for mild pain.   Yes [provider]  albuterol (PROVENTIL HFA;VENTOLIN HFA) 108 (90 Base) MCG/ACT inhaler Inhale 2 puffs into the lungs every 6 (six) hours as needed for wheezing or shortness of breath. 08/09/17  Yes Medina-Vargas, Monina C, NP  amiodarone (PACERONE) 100 MG tablet TAKE 1 TABLET (100 MG TOTAL) BY MOUTH DAILY Patient taking differently: Take 100 mg by mouth daily.  09/20/18  Yes Larey Dresser, MD  amLODipine (NORVASC) 10 MG tablet TAKE 1 TABLET (10 MG TOTAL) BY MOUTH DAILY. 05/01/19  Yes Larey Dresser, MD  apixaban (ELIQUIS) 5 MG TABS tablet Take 1 tablet (5 mg total) by mouth 2 (two) times daily. 08/09/17  Yes Medina-Vargas, Monina C, NP  Aspirin-Caffeine (BC FAST PAIN RELIEF PO) Take 1 Package by mouth as needed (headache).   Yes [provider]  atorvastatin (LIPITOR) 40 MG tablet TAKE 1 TABLET BY MOUTH ONCE EVERY EVENING Patient taking differently: Take 40 mg by mouth daily. TAKE 1 TABLET BY MOUTH ONCE EVERY EVENING 08/09/17  Yes Medina-Vargas, Monina C, NP  bisoprolol (ZEBETA) 5 MG tablet Take 1 tablet (5 mg total) by mouth daily. 05/01/19  Yes Larey Dresser, MD  cholecalciferol (VITAMIN D) 1000 units tablet Take 1,000 Units by mouth daily.    Yes [provider]  clopidogrel (PLAVIX) 75 MG tablet Take 75 mg by mouth daily.   Yes [provider]  folic acid (FOLVITE) 1 MG tablet Take 1 tablet (1 mg total) by mouth daily. Patient taking differently: Take 1 mg by mouth daily.  10/25/18  Yes  Kayleen Memos, DO  Insulin Glargine (LANTUS SOLOSTAR) 100 UNIT/ML Solostar Pen Inject 40 Units into the skin at bedtime.    Yes [provider]  Insulin Lispro (HUMALOG KWIKPEN) 200 UNIT/ML SOPN Inject 15 Units into the skin 3 (three) times daily with meals.    Yes [provider]  levothyroxine (SYNTHROID, LEVOTHROID) 50 MCG tablet Take 1 tablet (50 mcg total) by mouth daily. 08/09/17  Yes Medina-Vargas, Monina C, NP  losartan (COZAAR) 100 MG tablet TAKE 1 TABLET (100 MG TOTAL) BY MOUTH DAILY. 04/02/19  Yes Bensimhon, Shaune Pascal, MD  metolazone (ZAROXOLYN) 2.5 MG tablet Take 1 tablet (2.5 mg total) by mouth once a week. On Thursdays 01/30/19  Yes Tillery, Satira Mccallum, PA-C  Multiple Vitamin (MULTIVITAMIN WITH MINERALS) TABS tablet Take 1 tablet by mouth daily. 10/25/18  Yes Irene Pap N, DO  potassium chloride SA (K-DUR) 20 MEQ tablet Take 2 tablets (40 mEq total)  by mouth daily. 05/01/19  Yes Larey Dresser, MD  spironolactone (ALDACTONE) 25 MG tablet Take 1 tablet (25 mg total) by mouth daily. 05/01/19  Yes Larey Dresser, MD  thiamine 100 MG tablet Take 1 tablet (100 mg total) by mouth daily. 10/25/18  Yes Kayleen Memos, DO  torsemide (DEMADEX) 20 MG tablet Take 2 tablets (40 mg total) by mouth 2 (two) times daily. 01/30/19  Yes Shirley Friar, PA-C  ibuprofen (ADVIL) 600 MG tablet Take 1 tablet (600 mg total) by mouth every 6 (six) hours as needed. 03/25/19   Charlann Lange, PA-C     Allergies:     Allergies  Allergen Reactions   Gabapentin Nausea And Vomiting and Other (See Comments)    POSSIBLE SHAKING? TAKING MED CURRENTLY   Lyrica [Pregabalin] Other (See Comments)    Shaking Pt still taking lyrica--"probably will stop taking"      Physical Exam:   Vitals  Blood pressure (!) 124/56, pulse 69, temperature 98.6 F (37 C), temperature source Oral, resp. rate 18, last menstrual period 11/27/2012, SpO2 100 %.   1. General well-developed female,  laying in bed in no apparent distress  2. Normal affect and insight, Not Suicidal or Homicidal, Awake Alert, Oriented X 3.  3. No F.N deficits, ALL C.Nerves Intact, Strength 5/5 all 4 extremities, Sensation intact all 4 extremities, Plantars down going.  4. Ears and Eyes appear Normal, Conjunctivae clear, PERRLA. Moist Oral Mucosa.  5. Supple Neck, No JVD, No cervical lymphadenopathy appriciated, No Carotid Bruits.  6. Symmetrical Chest wall movement, Good air movement bilaterally, CTAB.  7. RRR, No Gallops, Rubs or Murmurs, No Parasternal Heave.  +1 edema  8. Positive Bowel Sounds, Abdomen Soft, No tenderness, No organomegaly appriciated,No rebound -guarding or rigidity.  9.  Left fifth toe gangrenous wound, please see picture below, right transmetatarsal amputation  10. Good muscle tone,  joints appear normal , no effusions, Normal ROM.  11. No Palpable Lymph Nodes in Neck or Axillae      Data Review:    CBC Recent Labs  Lab 06/20/19 1125 06/21/19 0811 06/22/19 0302  WBC 12.4* 12.0* 9.4  HGB 9.7* 9.4* 9.7*  HCT 30.3* 31.4* 31.0*  PLT 342 316 297  MCV 93 100.0 96.0  MCH 29.8 29.9 30.0  MCHC 32.0 29.9* 31.3  RDW 12.7 13.4 13.2  LYMPHSABS 1.6 1.7  --   MONOABS  --  0.9  --   EOSABS 0.3 0.3  --   BASOSABS 0.0 0.0  --    ------------------------------------------------------------------------------------------------------------------  Chemistries  Recent Labs  Lab 06/20/19 1125 06/21/19 0811 06/22/19 0302  NA 139 137 138  K 5.9* 5.0 4.5  CL 104 107 102  CO2 17* 17* 22  GLUCOSE 90 217* 180*  BUN 90* 95* 88*  CREATININE 2.18* 2.07* 1.82*  CALCIUM 9.3 9.1 9.0   ------------------------------------------------------------------------------------------------------------------ estimated creatinine clearance is 40.3 mL/min (A) (by C-G formula based on SCr of 1.82 mg/dL  (H)). ---------------------------------------------------------------------------------------------------------------  Urinalysis    Component Value Date/Time   COLORURINE YELLOW 03/18/2019 1540   APPEARANCEUR CLEAR 03/18/2019 1540   LABSPEC 1.013 03/18/2019 1540   PHURINE 5.0 03/18/2019 1540   GLUCOSEU NEGATIVE 03/18/2019 1540   HGBUR NEGATIVE 03/18/2019 Brenda 03/18/2019 1540   KETONESUR NEGATIVE 03/18/2019 1540   PROTEINUR NEGATIVE 03/18/2019 1540   UROBILINOGEN 0.2 09/05/2015 0012   NITRITE NEGATIVE 03/18/2019 1540   LEUKOCYTESUR NEGATIVE 03/18/2019 1540    ----------------------------------------------------------------------------------------------------------------   Imaging  Results:    Ct Foot Left Wo Contrast  Result Date: 06/21/2019 CLINICAL DATA:  Wound on the left little toe for 3 weeks. EXAM: CT OF THE LEFT FOOT WITHOUT CONTRAST TECHNIQUE: Multidetector CT imaging of the left foot was performed according to the standard protocol. Multiplanar CT image reconstructions were also generated. COMPARISON:  None. 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 Loss of cortical bone is also seen in the head of the proximal phalanx consistent with osteomyelitis. No other acute bony abnormality is identified. Negative for fracture or dislocation. Mild first MTP osteoarthritis noted. Ligaments Suboptimally assessed by CT. Muscles and Tendons No intramuscular fluid collection. Mild atrophy of intrinsic musculature noted. No tendon tear. Soft tissues Small amount of gas is seen in the soft tissues of the little toe. IMPRESSION: Findings consistent with osteomyelitis in the head of the proximal phalanx and throughout the middle phalanx of the little toe. 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 Negative for abscess or evidence of septic joint. Electronically Signed   By: Inge Rise M.D.   On: 06/21/2019 12:23   Dg Foot Complete Left  Result Date: 06/21/2019 CLINICAL DATA:  Wound on the left little toe.  Left foot pain. EXAM: LEFT FOOT - COMPLETE 3+ VIEW COMPARISON:  None. FINDINGS: Soft tissues of the little toe are swollen and there is some soft tissue gas. No bony destructive change or periosteal reaction. No radiopaque foreign body. Atherosclerosis noted. No fracture or dislocation. IMPRESSION: Soft tissue gas and swelling in the little toe consistent with infection. Negative for radiopaque foreign body or evidence of osteomyelitis. Electronically Signed   By: Inge Rise M.D.   On: 06/21/2019 09:12    My personal review of EKG: Rhythm NSR, Rate  61 /min, QTc 426 , no Acute ST changes  [name] M.D on 06/22/2019 at 11:31 AM  Between 7am to 7pm - Pager - [phone]. After 7pm go to [email or web address] - password [facility]  [facility] - Office  [phone]

## 2019-06-23 LAB — BASIC METABOLIC PANEL
Anion gap: 16 — ABNORMAL HIGH (ref 5–15)
BUN: 87 mg/dL — ABNORMAL HIGH (ref 6–20)
CO2: 22 mmol/L (ref 22–32)
Calcium: 8.6 mg/dL — ABNORMAL LOW (ref 8.9–10.3)
Chloride: 96 mmol/L — ABNORMAL LOW (ref 98–111)
Creatinine, Ser: 1.78 mg/dL — ABNORMAL HIGH (ref 0.44–1.00)
GFR calc Af Amer: 36 mL/min — ABNORMAL LOW (ref >60)
GFR calc non Af Amer: 31 mL/min — ABNORMAL LOW (ref >60)
Glucose, Bld: 187 mg/dL — ABNORMAL HIGH (ref 70–99)
Potassium: 4 mmol/L (ref 3.5–5.1)
Sodium: 134 mmol/L — ABNORMAL LOW (ref 135–145)

## 2019-06-23 LAB — CBC
HCT: 30.6 % — ABNORMAL LOW (ref 36.0–46.0)
Hemoglobin: 9.7 g/dL — ABNORMAL LOW (ref 12.0–15.0)
MCH: 29.5 pg (ref 26.0–34.0)
MCHC: 31.7 g/dL (ref 30.0–36.0)
MCV: 93 fL (ref 80.0–100.0)
Platelets: 319 10*3/uL (ref 150–400)
RBC: 3.29 MIL/uL — ABNORMAL LOW (ref 3.87–5.11)
RDW: 13 % (ref 11.5–15.5)
WBC: 11.3 10*3/uL — ABNORMAL HIGH (ref 4.0–10.5)
nRBC: 0 % (ref 0.0–0.2)

## 2019-06-23 LAB — GLUCOSE, CAPILLARY
Glucose-Capillary: 191 mg/dL — ABNORMAL HIGH (ref 70–99)
Glucose-Capillary: 233 mg/dL — ABNORMAL HIGH (ref 70–99)
Glucose-Capillary: 209 mg/dL — ABNORMAL HIGH (ref 70–99)
Glucose-Capillary: 172 mg/dL — ABNORMAL HIGH (ref 70–99)

## 2019-06-23 MED ORDER — HYDROCODONE-ACETAMINOPHEN 5-325 MG PO TABS
1.0000 | ORAL_TABLET | ORAL | Status: DC | PRN
  Administered 2019-06-23 – 2019-06-25 (×9): 1 via ORAL
  Filled 2019-06-23 (×9): qty 1

## 2019-06-23 NOTE — Progress Notes (Signed)
Progress Note   Patient Demographics:    [name], is a 58 y.o. female  MRN: [id number]   DOB - 1960/11/18  Admit Date - 06/21/2019  Outpatient Primary MD for the patient is Tsosie Billing, MD (Inactive)  Outpatient Specialists: Patient been followed by Dr. Alvester Chou in the past regarding PVD of bilateral lower extremity with multiple angiograms and stent placed, she did see vascular Dr. Trula Slade for carotid stenosis, she did see Dr. Sharol Given in the past for right transmetatarsal amputation.  Patient coming from: home   HPI:    [name]  is a 58 y.o. female, with past medical history of diabetes mellitus, tobacco abuse, still smoking, CAD, peripheral vascular disease s/p angiogram and stent by Dr. Gwenlyn Found, A. Fib on Eliquis, COPD, cirrhosis, OSA, CKD stage III, hypertension. She presents to ED secondary to nonhealing left fifth toe wounds, as well sent by her PCP for hyperkalemia, apparently patient had potassium of 5.9 yesterday at PCP office, she was asked to come to ED, in ED potassium was 5, she is on Aldactone, losartan and potassium supplement, as well she was noted to have left fifth toe nonhealing ulcer, with foul-smelling odor, report is been for last 3 weeks, she has not been given any antibiotics, denies fever, chills, shortness of breath. X-ray significant for osteomyelitis of left fifth toe, potassium of 5, and reddening of 2, white blood cell of 12,000, hospitalist called to admit.    Subjective    No acute issues or events overnight, pain moderately well controlled, minimal bleeding from bandage site, denies chest pain, shortness of breath, nausea, vomiting, diarrhea, constipation, headache, fever, chills.    Assessment & Plan:  Active Problems:   DM (diabetes mellitus), type 2 with peripheral vascular complications ([name])   Carotid arterial disease ([name])   Coronary artery disease   Anemia- b  12 deficiency   Chronic combined systolic and diastolic CHF (congestive heart failure) ([name])   COPD GOLD 0    CKD (chronic kidney disease), stage III ([name])   CHF (congestive heart failure) ([name])   Osteomyelitis ([name])   Left fifth toe with infected diabetic ulcer and osteomyelitis -X-ray significant for osteomyelitis -Cultures pending -Continue vancomycin and Rocephin. (MRSA nares positive) -Orthopedic consult greatly appreciated, will need PAD to be addressed first - as below  PAD/PVD -Known history of peripheral vascular disease -Previously seen by Dr. Alvester Chou in the past, acquired stenting right lower extremity, she had angiograms done in the past, discussed with cardiology service, Dr. Gwenlyn Found will see patient on Monday 06/24/19 -Continue with statin, eliquis, plavix for now  Chronic combined diastolic and systolic CHF, not in acute exacerbation -Echo 10/2018 with EF 45-50% (improved from40-45% 11/2016). -Appears to be a euvolemic, continue with home dose torsemide -We will hold losartan, and Aldactone given hyperkalemia; albeit mild  Hyperkalemia,mild, resolved -Potassium of 5 currently, will hold Aldactone, losartan and potassium supplement -If downtrending further in the morning would restart home meds   Diabetes mellitus, insulin dependent, moderately well controlled -Continue lower dose lantus at 30 units during hospital  stay, will continue with insulin sliding scale moderate protocol, and 5 units of NovoLog before meals.  CKD 3, stable - better than baseline -Baseline appears to be around 2.2 - currently 1.8  Paroxysmal atrial fibrillation: -With normal sinus rhythm, continue with amiodarone and Eliquis -Unclear if patient needs chronic anticoagulation if paroxysmal afib and in sinus majority of stay - defer to outpatient cardiology for further eval/treatment.  Essential hypertension, well controlled - Continue with home medication, losartan on hold given  hyperkalemia  Hyperlipidemia -Continue atorvastatin  Hypothyroidism: -Continue with levothyroxine  Tobacco abuse:  - Was counseled, started on nicotine patch   DVT Prophylaxis Eliquis Code Status Full code Dispo: Likely DC to  Home pending further eval and workup as above Condition GUARDED   Consults called: Orthopedic consulted, discussed with cardiology, Dr. Gwenlyn Found will see on Monday Admission status: inpatient Time spent in minutes : 35 minutes   Home Medications:   Prior to Admission medications   Medication Sig Start Date End Date Taking? Authorizing Provider  acetaminophen (TYLENOL) 500 MG tablet Take 1,000 mg by mouth every 6 (six) hours as needed for mild pain.   Yes [provider]  albuterol (PROVENTIL HFA;VENTOLIN HFA) 108 (90 Base) MCG/ACT inhaler Inhale 2 puffs into the lungs every 6 (six) hours as needed for wheezing or shortness of breath. 08/09/17  Yes Medina-Vargas, Monina C, NP  amiodarone (PACERONE) 100 MG tablet TAKE 1 TABLET (100 MG TOTAL) BY MOUTH DAILY Patient taking differently: Take 100 mg by mouth daily.  09/20/18  Yes Larey Dresser, MD  amLODipine (NORVASC) 10 MG tablet TAKE 1 TABLET (10 MG TOTAL) BY MOUTH DAILY. 05/01/19  Yes Larey Dresser, MD  apixaban (ELIQUIS) 5 MG TABS tablet Take 1 tablet (5 mg total) by mouth 2 (two) times daily. 08/09/17  Yes Medina-Vargas, Monina C, NP  Aspirin-Caffeine (BC FAST PAIN RELIEF PO) Take 1 Package by mouth as needed (headache).   Yes [provider]  atorvastatin (LIPITOR) 40 MG tablet TAKE 1 TABLET BY MOUTH ONCE EVERY EVENING Patient taking differently: Take 40 mg by mouth daily. TAKE 1 TABLET BY MOUTH ONCE EVERY EVENING 08/09/17  Yes Medina-Vargas, Monina C, NP  bisoprolol (ZEBETA) 5 MG tablet Take 1 tablet (5 mg total) by mouth daily. 05/01/19  Yes Larey Dresser, MD  cholecalciferol (VITAMIN D) 1000 units tablet Take 1,000 Units by mouth daily.    Yes [provider]  clopidogrel  (PLAVIX) 75 MG tablet Take 75 mg by mouth daily.   Yes [provider]  folic acid (FOLVITE) 1 MG tablet Take 1 tablet (1 mg total) by mouth daily. Patient taking differently: Take 1 mg by mouth daily.  10/25/18  Yes Kayleen Memos, DO  Insulin Glargine (LANTUS SOLOSTAR) 100 UNIT/ML Solostar Pen Inject 40 Units into the skin at bedtime.    Yes [provider]  Insulin Lispro (HUMALOG KWIKPEN) 200 UNIT/ML SOPN Inject 15 Units into the skin 3 (three) times daily with meals.    Yes [provider]  levothyroxine (SYNTHROID, LEVOTHROID) 50 MCG tablet Take 1 tablet (50 mcg total) by mouth daily. 08/09/17  Yes Medina-Vargas, Monina C, NP  losartan (COZAAR) 100 MG tablet TAKE 1 TABLET (100 MG TOTAL) BY MOUTH DAILY. 04/02/19  Yes Bensimhon, Shaune Pascal, MD  metolazone (ZAROXOLYN) 2.5 MG tablet Take 1 tablet (2.5 mg total) by mouth once a week. On Thursdays 01/30/19  Yes Shirley Friar, PA-C  Multiple Vitamin (MULTIVITAMIN WITH MINERALS) TABS tablet Take  1 tablet by mouth daily. 10/25/18  Yes Irene Pap N, DO  potassium chloride SA (K-DUR) 20 MEQ tablet Take 2 tablets (40 mEq total) by mouth daily. 05/01/19  Yes Larey Dresser, MD  spironolactone (ALDACTONE) 25 MG tablet Take 1 tablet (25 mg total) by mouth daily. 05/01/19  Yes Larey Dresser, MD  thiamine 100 MG tablet Take 1 tablet (100 mg total) by mouth daily. 10/25/18  Yes Kayleen Memos, DO  torsemide (DEMADEX) 20 MG tablet Take 2 tablets (40 mg total) by mouth 2 (two) times daily. 01/30/19  Yes Shirley Friar, PA-C  ibuprofen (ADVIL) 600 MG tablet Take 1 tablet (600 mg total) by mouth every 6 (six) hours as needed. 03/25/19   Charlann Lange, PA-C     Allergies:     Allergies  Allergen Reactions   Gabapentin Nausea And Vomiting and Other (See Comments)    POSSIBLE SHAKING? TAKING MED CURRENTLY   Lyrica [Pregabalin] Other (See Comments)    Shaking Pt still taking lyrica--"probably will stop taking"       Physical Exam:   Vitals  Blood pressure 127/64, pulse 78, temperature 98.2 F (36.8 C), temperature source Oral, resp. rate 18, last menstrual period 11/27/2012, SpO2 99 %.   1. General well-developed female, laying in bed in no apparent distress  2. Normal affect and insight, Not Suicidal or Homicidal, Awake Alert, Oriented X 3.  3. No F.N deficits, ALL C.Nerves Intact, Strength 5/5 all 4 extremities, Sensation intact all 4 extremities, Plantars down going.  4. Ears and Eyes appear Normal, Conjunctivae clear, PERRLA. Moist Oral Mucosa.  5. Supple Neck, No JVD, No cervical lymphadenopathy appriciated, No Carotid Bruits.  6. Symmetrical Chest wall movement, Good air movement bilaterally, CTAB.  7. RRR, No Gallops, Rubs or Murmurs, No Parasternal Heave.  +1 edema  8. Positive Bowel Sounds, Abdomen Soft, No tenderness, No organomegaly appriciated,No rebound -guarding or rigidity.  9.  Left fifth toe gangrenous wound, please see picture below, right transmetatarsal amputation  10. Good muscle tone,  joints appear normal , no effusions, Normal ROM.  11. No Palpable Lymph Nodes in Neck or Axillae      Data Review:    CBC Recent Labs  Lab 06/20/19 1125 06/21/19 0811 06/22/19 0302  WBC 12.4* 12.0* 9.4  HGB 9.7* 9.4* 9.7*  HCT 30.3* 31.4* 31.0*  PLT 342 316 297  MCV 93 100.0 96.0  MCH 29.8 29.9 30.0  MCHC 32.0 29.9* 31.3  RDW 12.7 13.4 13.2  LYMPHSABS 1.6 1.7  --   MONOABS  --  0.9  --   EOSABS 0.3 0.3  --   BASOSABS 0.0 0.0  --    ------------------------------------------------------------------------------------------------------------------  Chemistries  Recent Labs  Lab 06/20/19 1125 06/21/19 0811 06/22/19 0302  NA 139 137 138  K 5.9* 5.0 4.5  CL 104 107 102  CO2 17* 17* 22  GLUCOSE 90 217* 180*  BUN 90* 95* 88*  CREATININE 2.18* 2.07* 1.82*  CALCIUM 9.3 9.1 9.0    ------------------------------------------------------------------------------------------------------------------ estimated creatinine clearance is 40.3 mL/min (A) (by C-G formula based on SCr of 1.82 mg/dL (H)). ---------------------------------------------------------------------------------------------------------------  Urinalysis    Component Value Date/Time   COLORURINE YELLOW 03/18/2019 1540   APPEARANCEUR CLEAR 03/18/2019 1540   LABSPEC 1.013 03/18/2019 1540   PHURINE 5.0 03/18/2019 1540   GLUCOSEU NEGATIVE 03/18/2019 [place] 03/18/2019 [place] 03/18/2019 1540   [place] 03/18/2019 1540   PROTEINUR NEGATIVE 03/18/2019 1540  UROBILINOGEN 0.2 09/05/2015 0012   NITRITE NEGATIVE 03/18/2019 1540   LEUKOCYTESUR NEGATIVE 03/18/2019 1540    ----------------------------------------------------------------------------------------------------------------   Imaging Results:    Ct Foot Left Wo Contrast  Result Date: 06/21/2019 CLINICAL DATA:  Wound on the left little toe for 3 weeks. EXAM: CT OF THE LEFT FOOT WITHOUT CONTRAST TECHNIQUE: Multidetector CT imaging of the left foot was performed according to the standard protocol. Multiplanar CT image reconstructions were also generated. COMPARISON:  None. 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 Loss of cortical bone is also seen in the head of the proximal phalanx consistent with osteomyelitis. No other acute bony abnormality is identified. Negative for fracture or dislocation. Mild first MTP osteoarthritis noted. Ligaments Suboptimally assessed by CT. Muscles and Tendons No intramuscular fluid collection. Mild atrophy of intrinsic musculature noted. No tendon tear. Soft tissues Small amount of gas is seen in the soft tissues of  the little toe. IMPRESSION: Findings consistent with osteomyelitis in the head of the proximal phalanx and throughout the middle phalanx of the little toe. 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 Negative for abscess or evidence of septic joint. Electronically Signed   By: Inge Rise M.D.   On: 06/21/2019 12:23   Dg Foot Complete Left  Result Date: 06/21/2019 CLINICAL DATA:  Wound on the left little toe.  Left foot pain. EXAM: LEFT FOOT - COMPLETE 3+ VIEW COMPARISON:  None. FINDINGS: Soft tissues of the little toe are swollen and there is some soft tissue gas. No bony destructive change or periosteal reaction. No radiopaque foreign body. Atherosclerosis noted. No fracture or dislocation. IMPRESSION: Soft tissue gas and swelling in the little toe consistent with infection. Negative for radiopaque foreign body or evidence of osteomyelitis. Electronically Signed   By: Inge Rise M.D.   On: 06/21/2019 09:12    My personal review of EKG: Rhythm NSR, Rate  61 /min, QTc 426 , no Acute ST changes  [name] M.D on 06/23/2019 at 7:32 AM  Between 7am to 7pm - Pager - [phone]. After 7pm go to [email or web address] - password [email or web address]  [facility] - Office  [phone]

## 2019-06-23 NOTE — Plan of Care (Signed)
  Problem: Pain Managment: Goal: General experience of comfort will improve Outcome: Progressing   Problem: Safety: Goal: Ability to remain free from injury will improve Outcome: Progressing   

## 2019-06-24 DIAGNOSIS — I998 Other disorder of circulatory system: Secondary | ICD-10-CM

## 2019-06-24 DIAGNOSIS — M86179 Other acute osteomyelitis, unspecified ankle and foot: Secondary | ICD-10-CM

## 2019-06-24 LAB — CBC
HCT: 28.8 % — ABNORMAL LOW (ref 36.0–46.0)
Hemoglobin: 9.5 g/dL — ABNORMAL LOW (ref 12.0–15.0)
MCH: 30.3 pg (ref 26.0–34.0)
MCHC: 33 g/dL (ref 30.0–36.0)
MCV: 91.7 fL (ref 80.0–100.0)
Platelets: 280 10*3/uL (ref 150–400)
RBC: 3.14 MIL/uL — ABNORMAL LOW (ref 3.87–5.11)
RDW: 12.8 % (ref 11.5–15.5)
WBC: 12.7 10*3/uL — ABNORMAL HIGH (ref 4.0–10.5)
nRBC: 0 % (ref 0.0–0.2)

## 2019-06-24 LAB — BASIC METABOLIC PANEL
Anion gap: 13 (ref 5–15)
BUN: 90 mg/dL — ABNORMAL HIGH (ref 6–20)
CO2: 27 mmol/L (ref 22–32)
Calcium: 8.4 mg/dL — ABNORMAL LOW (ref 8.9–10.3)
Chloride: 96 mmol/L — ABNORMAL LOW (ref 98–111)
Creatinine, Ser: 1.85 mg/dL — ABNORMAL HIGH (ref 0.44–1.00)
GFR calc Af Amer: 35 mL/min — ABNORMAL LOW (ref >60)
GFR calc non Af Amer: 30 mL/min — ABNORMAL LOW (ref >60)
Glucose, Bld: 200 mg/dL — ABNORMAL HIGH (ref 70–99)
Potassium: 3.3 mmol/L — ABNORMAL LOW (ref 3.5–5.1)
Sodium: 136 mmol/L (ref 135–145)

## 2019-06-24 LAB — HEPARIN LEVEL (UNFRACTIONATED): Heparin Unfractionated: >2.2 IU/mL — ABNORMAL HIGH (ref 0.30–0.70)

## 2019-06-24 LAB — APTT
aPTT: 39 seconds — ABNORMAL HIGH (ref 24–36)
aPTT: 49 seconds — ABNORMAL HIGH (ref 24–36)

## 2019-06-24 LAB — GLUCOSE, CAPILLARY
Glucose-Capillary: 205 mg/dL — ABNORMAL HIGH (ref 70–99)
Glucose-Capillary: 174 mg/dL — ABNORMAL HIGH (ref 70–99)
Glucose-Capillary: 234 mg/dL — ABNORMAL HIGH (ref 70–99)
Glucose-Capillary: 242 mg/dL — ABNORMAL HIGH (ref 70–99)

## 2019-06-24 MED ORDER — TRAZODONE HCL 50 MG PO TABS
50.0000 mg | ORAL_TABLET | Freq: Once | ORAL | Status: DC
  Filled 2019-06-24 (×2): qty 1

## 2019-06-24 MED ORDER — HEPARIN (PORCINE) 25000 UT/250ML-% IV SOLN
1800.0000 [IU]/h | INTRAVENOUS | Status: DC
  Administered 2019-06-24: 14:00:00 1100 [IU]/h via INTRAVENOUS
  Administered 2019-06-25: 1500 [IU]/h via INTRAVENOUS
  Administered 2019-06-25: 1900 [IU]/h via INTRAVENOUS
  Filled 2019-06-24 (×3): qty 250

## 2019-06-24 MED ORDER — CALCIUM CARBONATE ANTACID 500 MG PO CHEW
400.0000 mg | CHEWABLE_TABLET | Freq: Once | ORAL | Status: AC
  Administered 2019-06-24: 400 mg via ORAL
  Filled 2019-06-24: qty 2

## 2019-06-24 NOTE — Progress Notes (Signed)
Contacted MD about keeping patient NPO, was advised to keep patient NPO due to possible procedure in the a.m.[name]

## 2019-06-24 NOTE — Plan of Care (Signed)
  Problem: Education: Goal: Knowledge of General Education information will improve Description: Including pain rating scale, medication(s)/side effects and non-pharmacologic comfort measures Outcome: Progressing   Problem: Health Behavior/Discharge Planning: Goal: Ability to manage health-related needs will improve Outcome: Progressing   Problem: Activity: Goal: Risk for activity intolerance will decrease Outcome: Progressing   Problem: Pain Managment: Goal: General experience of comfort will improve Outcome: Progressing   Problem: Safety: Goal: Ability to remain free from injury will improve Outcome: Progressing   Problem: Skin Integrity: Goal: Risk for impaired skin integrity will decrease Outcome: Progressing   

## 2019-06-24 NOTE — Evaluation (Signed)
Occupational Therapy Evaluation Patient Details Name: [name] MRN: [id number] DOB: [birth year] Today's Date: 06/24/2019    History of Present Illness Pt is a 58 y/o female admitted secondary to L 5th toe osteomyelitis. PMH including but not limited to    Clinical Impression   Pt is at Mod I - Independent level with ADLs/selfcare and sup with mobility using RW. No further acute OT is indicated at this time, pt to continue to acute PT for functional mobility safety. All education completed, OT will sign off.    Follow Up Recommendations  No OT follow up;Supervision - Intermittent    Equipment Recommendations  None recommended by OT    Recommendations for Other Services       Precautions / Restrictions Precautions Precautions: Fall Precaution Comments: mod fall Restrictions Weight Bearing Restrictions: No LLE Weight Bearing: Weight bearing as tolerated Other Position/Activity Restrictions: post op shoe      Mobility Bed Mobility Overal bed mobility: Modified Independent             General bed mobility comments: use of bed rails  Transfers Overall transfer level: Needs assistance Equipment used: Rolling walker (2 wheeled) Transfers: Sit to/from Stand Sit to Stand: Supervision         General transfer comment: for safety, good technique    Balance Overall balance assessment: Needs assistance Sitting-balance support: Feet supported Sitting balance-Leahy Scale: Good     Standing balance support: Bilateral upper extremity supported Standing balance-Leahy Scale: Good                             ADL either performed or assessed with clinical judgement   ADL Overall ADL's : Modified independent;At baseline                                       General ADL Comments: sup with toilet and shower transfers     Vision Patient Visual Report: No change from baseline       Perception     Praxis      Pertinent Vitals/Pain  Pain Assessment: 0-10 Pain Score: 6  Pain Location: bilateral feet Pain Descriptors / Indicators: Sharp;Pins and needles Pain Intervention(s): Limited activity within patient's tolerance;Monitored during session;Repositioned     Hand Dominance Right   Extremity/Trunk Assessment     Lower Extremity Assessment Lower Extremity Assessment: Defer to PT evaluation   Cervical / Trunk Assessment Cervical / Trunk Assessment: Normal   Communication Communication Communication: No difficulties   Cognition Arousal/Alertness: Awake/alert Behavior During Therapy: WFL for tasks assessed/performed Overall Cognitive Status: Within Functional Limits for tasks assessed                                     General Comments       Exercises     Shoulder Instructions      Home Living Family/patient expects to be discharged to:: Private residence Living Arrangements: Other relatives Available Help at Discharge: Family;Available 24 hours/day Type of Home: House   Entrance Stairs-Number of Steps: 7   Home Layout: Two level Alternate Level Stairs-Number of Steps: flight Alternate Level Stairs-Rails: Right;Left Bathroom Shower/Tub: Tub/shower unit         Home Equipment: Environmental consultant - 2 wheels;Walker - 4 wheels;Cane - quad;Shower seat;Wheelchair -  manual   Additional Comments: pt lives with cousin in second story apt, they have been trying to get a ground level apt      Prior Functioning/Environment Level of Independence: Independent with assistive device(s)        Comments: ambulates with use of a cane, independent with ADLs.        OT Problem List: Impaired balance (sitting and/or standing);Decreased activity tolerance;Obesity;Pain      OT Treatment/Interventions:      OT Goals(Current goals can be found in the care plan section) Acute Rehab OT Goals Patient Stated Goal: decrease pain OT Goal Formulation: With patient  OT Frequency:     Barriers to D/C:     no barriers       Co-evaluation              AM-PAC OT "6 Clicks" Daily Activity     Outcome Measure Help from another person eating meals?: None Help from another person taking care of personal grooming?: None Help from another person toileting, which includes using toliet, bedpan, or urinal?: None Help from another person bathing (including washing, rinsing, drying)?: None Help from another person to put on and taking off regular upper body clothing?: None Help from another person to put on and taking off regular lower body clothing?: None 6 Click Score: 24   End of Session Equipment Utilized During Treatment: Rolling walker  Activity Tolerance: Patient tolerated treatment well Patient left: in bed;with call bell/phone within reach  OT Visit Diagnosis: Other abnormalities of gait and mobility (R26.89);Pain Pain - Right/Left: (bilaterally) Pain - part of body: Ankle and joints of foot                Time: [id number] OT Time Calculation (min): 24 min Charges:  OT General Charges $OT Visit: 1 Visit OT Evaluation $OT Eval Moderate Complexity: 1 Mod   Britt Bottom 06/24/2019, 3:07 PM

## 2019-06-24 NOTE — Progress Notes (Signed)
Physical Therapy Treatment Patient Details Name: [name] MRN: [id number] DOB: [birth year] Today's Date: 06/24/2019    History of Present Illness Pt is a 58 y/o female admitted secondary to L 5th toe osteomyelitis. PMH including but not limited to     PT Comments    Patient received in bed, states "im waiting for my food." Agrees to walk with PT. Reports significant pain in B feet. 8/10. Modified independence with bed mobility ( use of rails). Transfers and ambulates 45 feet with RW post op shoe donned with supervision. Patient will benefit from continued skilled PT while here to improve endurance and activity tolerance. Patient to have procedure and possible amputation on left foot on Wednesday.    Follow Up Recommendations  No PT follow up     Equipment Recommendations  None recommended by PT    Recommendations for Other Services       Precautions / Restrictions Precautions Precautions: Fall Precaution Comments: mod fall Restrictions Weight Bearing Restrictions: No LLE Weight Bearing: Weight bearing as tolerated Other Position/Activity Restrictions: post op shoe    Mobility  Bed Mobility Overal bed mobility: Modified Independent             General bed mobility comments: use of bed rails  Transfers Overall transfer level: Needs assistance Equipment used: Rolling walker (2 wheeled) Transfers: Sit to/from Stand Sit to Stand: Supervision         General transfer comment: for safety, good technique  Ambulation/Gait Ambulation/Gait assistance: Supervision Gait Distance (Feet): 45 Feet Assistive device: Rolling walker (2 wheeled) Gait Pattern/deviations: Decreased stride length     General Gait Details: pt limited in distance secondary to pain; pt very steady with use of RW, no LOB or need for physical assistance   Stairs             Wheelchair Mobility    Modified Rankin (Stroke Patients Only)       Balance Overall balance assessment:  Needs assistance Sitting-balance support: Feet supported Sitting balance-Leahy Scale: Good     Standing balance support: Bilateral upper extremity supported Standing balance-Leahy Scale: Good                              Cognition Arousal/Alertness: Awake/alert Behavior During Therapy: WFL for tasks assessed/performed Overall Cognitive Status: Within Functional Limits for tasks assessed                                        Exercises      General Comments        Pertinent Vitals/Pain Pain Assessment: 0-10 Pain Score: 8  Pain Location: bilateral feet Pain Descriptors / Indicators: Sharp;Pins and needles Pain Intervention(s): Limited activity within patient's tolerance;Monitored during session;Repositioned    Home Living                      Prior Function            PT Goals (current goals can now be found in the care plan section) Acute Rehab PT Goals Patient Stated Goal: decrease pain PT Goal Formulation: With patient Time For Goal Achievement: 07/06/19 Potential to Achieve Goals: Good Progress towards PT goals: Progressing toward goals    Frequency    Min 3X/week      PT Plan Current plan remains appropriate  Co-evaluation              AM-PAC PT "6 Clicks" Mobility   Outcome Measure  Help needed turning from your back to your side while in a flat bed without using bedrails?: None Help needed moving from lying on your back to sitting on the side of a flat bed without using bedrails?: None Help needed moving to and from a bed to a chair (including a wheelchair)?: None Help needed standing up from a chair using your arms (e.g., wheelchair or bedside chair)?: None Help needed to walk in hospital room?: None Help needed climbing 3-5 steps with a railing? : A Little 6 Click Score: 23    End of Session Equipment Utilized During Treatment: Gait belt Activity Tolerance: Patient limited by pain Patient left:  in chair;with call bell/phone within reach Nurse Communication: Mobility status PT Visit Diagnosis: Other abnormalities of gait and mobility (R26.89);Pain Pain - Right/Left: Left(bilateral feet) Pain - part of body: Ankle and joints of foot     Time: 1430-1445 PT Time Calculation (min) (ACUTE ONLY): 15 min  Charges:  $Gait Training: 8-22 mins                     Brooklee Michelin, PT, GCS 06/24/19,3:05 PM

## 2019-06-24 NOTE — Progress Notes (Signed)
Progress Note   Patient Demographics:    [name], is a 58 y.o. female  MRN: [id number]   DOB - 1961-09-06  Admit Date - 06/21/2019  Outpatient Primary MD for the patient is Tsosie Billing, MD (Inactive)  Outpatient Specialists: Patient been followed by Dr. Alvester Chou in the past regarding PVD of bilateral lower extremity with multiple angiograms and stent placed, she did see vascular Dr. Trula Slade for carotid stenosis, she did see Dr. Sharol Given in the past for right transmetatarsal amputation.  Patient coming from: home   HPI:    [name]  is a 58 y.o. female, with past medical history of diabetes mellitus, tobacco abuse, still smoking, CAD, peripheral vascular disease s/p angiogram and stent by Dr. Gwenlyn Found, A. Fib on Eliquis, COPD, cirrhosis, OSA, CKD stage III, hypertension. She presents to ED secondary to nonhealing left fifth toe wounds, as well sent by her PCP for hyperkalemia, apparently patient had potassium of 5.9 yesterday at PCP office, she was asked to come to ED, in ED potassium was 5, she is on Aldactone, losartan and potassium supplement, as well she was noted to have left fifth toe nonhealing ulcer, with foul-smelling odor, report is been for last 3 weeks, she has not been given any antibiotics, denies fever, chills, shortness of breath. X-ray significant for osteomyelitis of left fifth toe, potassium of 5, and reddening of 2, white blood cell of 12,000, hospitalist called to admit.    Subjective    No acute issues or events overnight, pain somewhat uncontrolled today - much worse with any weight bearing but still minimally worse even at rest today. Otherwise denies chest pain, shortness of breath, nausea, vomiting, diarrhea, constipation, headache, fever, chills.   Assessment & Plan:  Active Problems:   DM (diabetes mellitus), type 2 with peripheral vascular complications ([name])   Carotid arterial  disease ([name])   Coronary artery disease   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   Chronic combined systolic and diastolic CHF (congestive heart failure) ([name])   COPD GOLD 0    CKD (chronic kidney disease), stage III ([name])   CHF (congestive heart failure) ([name])   Osteomyelitis ([name])  Left fifth toe with infected diabetic ulcer and osteomyelitis -X-ray significant for osteomyelitis -Cultures pending -Continue vancomycin and Rocephin. (MRSA nares positive) -Orthopedic consult greatly appreciated, will need PAD to be addressed first - as below  PAD/PVD -Known history of peripheral vascular disease; s/p R transmetatarsal amputation -Previous eval/treatment with vascular (Dr. Alvester Chou) in the past -Pending evaluation on Wed 06/26/19 -Hold eliquis for possible procedure/evaluation - started on heparin gtt for coverage -Continue with statin, plavix for now  Chronic combined diastolic and systolic CHF, not in acute exacerbation -Echo 10/2018 with EF 45-50% (improved from40-45% 11/2016). -Appears to be a euvolemic, continue with home dose torsemide -We will hold losartan, and Aldactone given hyperkalemia; albeit mild  Hyperkalemia,mild, resolved -Potassium of 5 currently, will hold Aldactone, losartan and potassium supplement -If downtrending further in the morning would restart home meds   Diabetes mellitus, insulin dependent, moderately well controlled -Continue lower dose  lantus at 30 units during hospital stay, will continue with insulin sliding scale moderate protocol, and 5 units of NovoLog before meals.  CKD 3, stable - better than baseline -Baseline appears to be around 2.2 per previous records - currently 1.8  Paroxysmal atrial fibrillation: -With normal sinus rhythm, continue with amiodarone and hold Eliquis as above -Unclear if patient needs chronic anticoagulation if paroxysmal afib and in sinus majority of stay - defer to outpatient cardiology for further eval/treatment.  Essential  hypertension, well controlled - Continue with home medication, losartan on hold given hyperkalemia  Hyperlipidemia -Continue atorvastatin  Hypothyroidism: -Continue with levothyroxine  Tobacco abuse:  - Was counseled at length given above vascular disease, started on nicotine patch  DVT Prophylaxis heparin gtt Code Status Full code Dispo: Likely DC to  Home pending further eval and workup as above Condition GUARDED   Consults called: Orthopedic sx, cardiology/vascular Admission status: inpatient Time spent in minutes : >35 minutes   Home Medications:   Prior to Admission medications   Medication Sig Start Date End Date Taking? Authorizing Provider  acetaminophen (TYLENOL) 500 MG tablet Take 1,000 mg by mouth every 6 (six) hours as needed for mild pain.   Yes [provider]  albuterol (PROVENTIL HFA;VENTOLIN HFA) 108 (90 Base) MCG/ACT inhaler Inhale 2 puffs into the lungs every 6 (six) hours as needed for wheezing or shortness of breath. 08/09/17  Yes Medina-Vargas, Monina C, NP  amiodarone (PACERONE) 100 MG tablet TAKE 1 TABLET (100 MG TOTAL) BY MOUTH DAILY Patient taking differently: Take 100 mg by mouth daily.  09/20/18  Yes Larey Dresser, MD  amLODipine (NORVASC) 10 MG tablet TAKE 1 TABLET (10 MG TOTAL) BY MOUTH DAILY. 05/01/19  Yes Larey Dresser, MD  apixaban (ELIQUIS) 5 MG TABS tablet Take 1 tablet (5 mg total) by mouth 2 (two) times daily. 08/09/17  Yes Medina-Vargas, Monina C, NP  Aspirin-Caffeine (BC FAST PAIN RELIEF PO) Take 1 Package by mouth as needed (headache).   Yes [provider]  atorvastatin (LIPITOR) 40 MG tablet TAKE 1 TABLET BY MOUTH ONCE EVERY EVENING Patient taking differently: Take 40 mg by mouth daily. TAKE 1 TABLET BY MOUTH ONCE EVERY EVENING 08/09/17  Yes Medina-Vargas, Monina C, NP  bisoprolol (ZEBETA) 5 MG tablet Take 1 tablet (5 mg total) by mouth daily. 05/01/19  Yes Larey Dresser, MD  cholecalciferol (VITAMIN D) 1000 units  tablet Take 1,000 Units by mouth daily.    Yes [provider]  clopidogrel (PLAVIX) 75 MG tablet Take 75 mg by mouth daily.   Yes [provider]  folic acid (FOLVITE) 1 MG tablet Take 1 tablet (1 mg total) by mouth daily. Patient taking differently: Take 1 mg by mouth daily.  10/25/18  Yes Kayleen Memos, DO  Insulin Glargine (LANTUS SOLOSTAR) 100 UNIT/ML Solostar Pen Inject 40 Units into the skin at bedtime.    Yes [provider]  Insulin Lispro (HUMALOG KWIKPEN) 200 UNIT/ML SOPN Inject 15 Units into the skin 3 (three) times daily with meals.    Yes [provider]  levothyroxine (SYNTHROID, LEVOTHROID) 50 MCG tablet Take 1 tablet (50 mcg total) by mouth daily. 08/09/17  Yes Medina-Vargas, Monina C, NP  losartan (COZAAR) 100 MG tablet TAKE 1 TABLET (100 MG TOTAL) BY MOUTH DAILY. 04/02/19  Yes Bensimhon, Shaune Pascal, MD  metolazone (ZAROXOLYN) 2.5 MG tablet Take 1 tablet (2.5 mg total) by mouth once a week. On Thursdays 01/30/19  Yes Shirley Friar,  PA-C  Multiple Vitamin (MULTIVITAMIN WITH MINERALS) TABS tablet Take 1 tablet by mouth daily. 10/25/18  Yes Irene Pap N, DO  potassium chloride SA (K-DUR) 20 MEQ tablet Take 2 tablets (40 mEq total) by mouth daily. 05/01/19  Yes Larey Dresser, MD  spironolactone (ALDACTONE) 25 MG tablet Take 1 tablet (25 mg total) by mouth daily. 05/01/19  Yes Larey Dresser, MD  thiamine 100 MG tablet Take 1 tablet (100 mg total) by mouth daily. 10/25/18  Yes Kayleen Memos, DO  torsemide (DEMADEX) 20 MG tablet Take 2 tablets (40 mg total) by mouth 2 (two) times daily. 01/30/19  Yes Shirley Friar, PA-C  ibuprofen (ADVIL) 600 MG tablet Take 1 tablet (600 mg total) by mouth every 6 (six) hours as needed. 03/25/19   Charlann Lange, PA-C     Allergies:     Allergies  Allergen Reactions  . Gabapentin Nausea And Vomiting and Other (See Comments)    POSSIBLE SHAKING? TAKING MED CURRENTLY  . Lyrica [Pregabalin]  Other (See Comments)    Shaking Pt still taking lyrica--"probably will stop taking"      Physical Exam:   Vitals  Blood pressure (!) 124/54, pulse 72, temperature 99.2 F (37.3 C), temperature source Oral, resp. rate 17, last menstrual period 11/27/2012, SpO2 97 %.   General well-developed female, laying in bed in no apparent distress Normal affect and insight, Not Suicidal or Homicidal, Awake Alert, Oriented X 3. No F.N deficits, ALL C.Nerves Intact, Strength 5/5 all 4 extremities, Sensation intact all 4 extremities, Plantars down going. Ears and Eyes appear Normal, Conjunctivae clear, PERRLA. Moist Oral Mucosa. Supple Neck, No JVD, No cervical lymphadenopathy appriciated, No Carotid Bruits. Symmetrical Chest wall movement, Good air movement bilaterally, CTAB. RRR, No Gallops, Rubs or Murmurs, No Parasternal Heave.  +1 edema Positive Bowel Sounds, Abdomen Soft, No tenderness, No organomegaly appriciated,No rebound guarding or rigidity. 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 No Palpable Lymph Nodes in Neck or Axillae      Data Review:    CBC Recent Labs  Lab 06/20/19 1125 06/21/19 0811 06/22/19 0302 06/23/19 0651 06/24/19 0435  WBC 12.4* 12.0* 9.4 11.3* 12.7*  HGB 9.7* 9.4* 9.7* 9.7* 9.5*  HCT 30.3* 31.4* 31.0* 30.6* 28.8*  PLT 342 316 297 319 280  MCV 93 100.0 96.0 93.0 91.7  MCH 29.8 29.9 30.0 29.5 30.3  MCHC 32.0 29.9* 31.3 31.7 33.0  RDW 12.7 13.4 13.2 13.0 12.8  LYMPHSABS 1.6 1.7  --   --   --   MONOABS  --  0.9  --   --   --   EOSABS 0.3 0.3  --   --   --   BASOSABS 0.0 0.0  --   --   --    ------------------------------------------------------------------------------------------------------------------  Chemistries  Recent Labs  Lab 06/20/19 1125 06/21/19 0811 06/22/19 0302 06/23/19 0651 06/24/19 0435  NA 139 137 138 134* 136   K 5.9* 5.0 4.5 4.0 3.3*  CL 104 107 102 96* 96*  CO2 17* 17* 22 22 27   GLUCOSE 90 217* 180* 187* 200*  BUN 90* 95* 88* 87* 90*  CREATININE 2.18* 2.07* 1.82* 1.78* 1.85*  CALCIUM 9.3 9.1 9.0 8.6* 8.4*   ------------------------------------------------------------------------------------------------------------------ estimated creatinine clearance is 39.7 mL/min (A) (by C-G formula based on SCr of 1.85 mg/dL (H)). ---------------------------------------------------------------------------------------------------------------  Urinalysis    Component Value Date/Time  COLORURINE YELLOW 03/18/2019 1540   APPEARANCEUR CLEAR 03/18/2019 1540   LABSPEC 1.013 03/18/2019 1540   PHURINE 5.0 03/18/2019 1540   GLUCOSEU NEGATIVE 03/18/2019 1540   HGBUR NEGATIVE 03/18/2019 [place] 03/18/2019 1540   KETONESUR NEGATIVE 03/18/2019 1540   PROTEINUR NEGATIVE 03/18/2019 1540   UROBILINOGEN 0.2 09/05/2015 0012   NITRITE NEGATIVE 03/18/2019 1540   [place] 03/18/2019 1540    ----------------------------------------------------------------------------------------------------------------   Imaging Results:    No results found.  [name] DO on 06/24/2019 at 7:56 AM  Between 7am to 7pm - Pager - [phone]. After 7pm go to [email or web address] - password [facility]  [facility] - Office  [phone]

## 2019-06-24 NOTE — H&P (View-Only) (Signed)
Cardiology Consultation:   Patient ID: WING PERAINO MRN: [id number]; DOB: [birth year]  Admit date: 06/21/2019 Date of Consult: 06/24/2019  Primary Care Provider: Tsosie Billing, MD (Inactive) Primary Cardiologist: Quay Burow, MD  Primary Electrophysiologist:  None    Patient Profile:   [name] is a 58 y.o. female with a hx of NICM, CHF, EF 45%, PAF on eliquis and amiodarone, IDDM,  Extensive vascular disease and now admitted with osteomyelitis of who is being seen today for the evaluation of PAD, with admit for Lt 5th toe infected diabetic ulcer and osteomyelitis at the request of Dr. Avon Gully.  History of Present Illness:   [name] with above hx and NICM with EF 45%, PAF on eliquis and amiodarone, PAD, continued tobacco use and IDDM.  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  + foul- smelling odor,     She had a right SFA intervention in 2014, a left carotid endarterectomy in April 2016 had a redo right SFA intervention in May 2018. 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  Recent elevated sCr.  Non healing Lt 5th toe ulcer LEA dopplers done July 2020 which revealed progression of her PVD.  She was seen in the office 06/20/19 and Dr. Gwenlyn Found reviewed angiograms.  Possible candidate for PV angiogram once her SCr. Is improved for limb salvage.    Labs today Cr 1.85, K+ 3.3 GFR 30  Na 136 Hgb 9.5, WBV 12.7 and plts 280.  EKG:  The EKG was personally reviewed and demonstrates:  SR at 61 no acute sT changes  Old Q wave in V2 Telemetry:  Telemetry was personally reviewed and demonstrates:  SR 3 beats on NSVT.    VS now 141/59 P 73 afebrile She is NPO   Heart Pathway Score:     Past Medical History:  Diagnosis Date   Alcohol abuse    Alcoholic cirrhosis ([name])    Anemia    Anxiety    Arthritis    "knees" (11/26/2018)   B12 deficiency    CAD (coronary artery disease)    a. cath 11/10/2014 LM nl, LAD min irregs, D1 30 ost, D2 50d, LCX 55m, OM1  80 p/m (1.5 mm vessel), OM2 15m, RCA nondom 6m-->med rx.. Demand ischemia in the setting of rapid a-fib.   Cardiomyopathy ([place])    Carotid artery disease ([name])    a. 01/2015 Carotid Angio: RICA [id number], LICA [id number]. s/p L carotid endarterectomy 02/2015.   Cellulitis    lower extremities   Chronic combined systolic and diastolic CHF (congestive heart failure) ([place])    a. Last echo 12/205: EF 40-45%, inferoapical/posterior HK, not technically sufficient to allow eval of LV diastolic function, normal LA in size..   Cocaine abuse ([name])    COPD (chronic obstructive pulmonary disease) ([name])    Critical lower limb ischemia    Depression    Diabetic peripheral neuropathy ([name])    Elevated troponin    a. Chronic elevation.   GERD (gastroesophageal reflux disease)    Hyperlipemia    Hypertension    Hypokalemia    Hypomagnesemia    Hypothyroidism    Marijuana abuse    Narcotic abuse ([place])    Noncompliance    NSVT (nonsustained ventricular tachycardia) ([name])    Obesity    PAF (paroxysmal atrial fibrillation) ([name])    Paroxysmal atrial tachycardia ([place])    Peripheral arterial disease ([place])    a. 01/2015 Angio/PTA: LSFA 100 w/ recon @ adductor canal and  1 vessel runoff via AT, RSFA 99 (atherectomy/pta) - 1 vessel runoff via diff dzs peroneal.   Pneumonia    "once or twice" (11/26/2018)   Poorly controlled type 2 diabetes mellitus ([place])    Renal insufficiency    a. 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   Sleep apnea    "couldn't handle wearing the mask" (11/26/2018)   Symptomatic bradycardia    avoid AV blocking agent per EP   Tobacco abuse     Past Surgical History:  Procedure Laterality Date   AMPUTATION Right 06/14/2017   Procedure: Right foot transmetatarsal amputation;  Surgeon: Newt Minion, MD;  Location: [facility];  Service: Orthopedics;  Laterality: Right;   AMPUTATION TOE Right 04/28/2017   Procedure: AMPUTATION OF RIGHT SECOND RAY;  Surgeon: Newt Minion, MD;  Location:  [facility];  Service: Orthopedics;  Laterality: Right;   CARDIAC CATHETERIZATION     CARDIAC CATHETERIZATION N/A 01/12/2016   Procedure: Temporary Wire;  Surgeon: Minus Breeding, MD;  Location: [facility];  Service: Cardiovascular;  Laterality: N/A;   CARDIOVERSION  ~ 02/2013   "twice"    CAROTID ANGIOGRAM N/A 01/15/2015   Procedure: CAROTID ANGIOGRAM;  Surgeon: [name], MD;  Location: [facility] CATH LAB;  Service: Cardiovascular;  Laterality: N/A;   DILATION AND CURETTAGE OF UTERUS  1988   ENDARTERECTOMY Left 02/19/2015   Procedure: LEFT CAROTID ENDARTERECTOMY ;  Surgeon: Serafina Mitchell, MD;  Location: [facility];  Service: Vascular;  Laterality: Left;   LEFT HEART CATHETERIZATION WITH CORONARY ANGIOGRAM N/A 10/31/2014   Procedure: LEFT HEART CATHETERIZATION WITH CORONARY ANGIOGRAM;  Surgeon: Burnell Blanks, MD;  Location: [facility] CATH LAB;  Service: Cardiovascular;  Laterality: N/A;   LOWER EXTREMITY ANGIOGRAM N/A 09/10/2013   Procedure: LOWER EXTREMITY ANGIOGRAM;  Surgeon: [name], MD;  Location: [name] CATH LAB;  Service: Cardiovascular;  Laterality: N/A;   LOWER EXTREMITY ANGIOGRAM N/A 01/15/2015   Procedure: LOWER EXTREMITY ANGIOGRAM;  Surgeon: [name], MD;  Location: [facility] CATH LAB;  Service: Cardiovascular;  Laterality: N/A;   LOWER EXTREMITY ANGIOGRAPHY N/A 04/13/2017   Procedure: Lower Extremity Angiography;  Surgeon: [name], MD;  Location: [facility];  Service: Cardiovascular;  Laterality: N/A;   PERIPHERAL VASCULAR INTERVENTION  04/13/2017   Procedure: Peripheral Vascular Intervention;  Surgeon: [name], MD;  Location: [facility];  Service: Cardiovascular;;     Home Medications:  Prior to Admission medications   Medication Sig Start Date End Date Taking? Authorizing Provider  acetaminophen (TYLENOL) 500 MG tablet Take 1,000 mg by mouth every 6 (six) hours as needed for mild pain.   Yes [provider]  albuterol  (PROVENTIL HFA;VENTOLIN HFA) 108 (90 Base) MCG/ACT inhaler Inhale 2 puffs into the lungs every 6 (six) hours as needed for wheezing or shortness of breath. 08/09/17  Yes Medina-Vargas, Monina C, NP  amiodarone (PACERONE) 100 MG tablet TAKE 1 TABLET (100 MG TOTAL) BY MOUTH DAILY Patient taking differently: Take 100 mg by mouth daily.  09/20/18  Yes Larey Dresser, MD  amLODipine (NORVASC) 10 MG tablet TAKE 1 TABLET (10 MG TOTAL) BY MOUTH DAILY. 05/01/19  Yes Larey Dresser, MD  apixaban (ELIQUIS) 5 MG TABS tablet Take 1 tablet (5 mg total) by mouth 2 (two) times daily. 08/09/17  Yes Medina-Vargas, Monina C, NP  Aspirin-Caffeine (BC FAST PAIN RELIEF PO) Take 1 Package by mouth as needed (headache).   Yes [provider]  atorvastatin (LIPITOR) 40  MG tablet TAKE 1 TABLET BY MOUTH ONCE EVERY EVENING Patient taking differently: Take 40 mg by mouth daily. TAKE 1 TABLET BY MOUTH ONCE EVERY EVENING 08/09/17  Yes Medina-Vargas, Monina C, NP  bisoprolol (ZEBETA) 5 MG tablet Take 1 tablet (5 mg total) by mouth daily. 05/01/19  Yes Larey Dresser, MD  cholecalciferol (VITAMIN D) 1000 units tablet Take 1,000 Units by mouth daily.    Yes [provider]  clopidogrel (PLAVIX) 75 MG tablet Take 75 mg by mouth daily.   Yes [provider]  folic acid (FOLVITE) 1 MG tablet Take 1 tablet (1 mg total) by mouth daily. Patient taking differently: Take 1 mg by mouth daily.  10/25/18  Yes Kayleen Memos, DO  Insulin Glargine (LANTUS SOLOSTAR) 100 UNIT/ML Solostar Pen Inject 40 Units into the skin at bedtime.    Yes [provider]  Insulin Lispro (HUMALOG KWIKPEN) 200 UNIT/ML SOPN Inject 15 Units into the skin 3 (three) times daily with meals.    Yes [provider]  levothyroxine (SYNTHROID, LEVOTHROID) 50 MCG tablet Take 1 tablet (50 mcg total) by mouth daily. 08/09/17  Yes Medina-Vargas, Monina C, NP  losartan (COZAAR) 100 MG tablet TAKE 1 TABLET (100 MG TOTAL) BY MOUTH DAILY.  04/02/19  Yes Bensimhon, Shaune Pascal, MD  metolazone (ZAROXOLYN) 2.5 MG tablet Take 1 tablet (2.5 mg total) by mouth once a week. On Thursdays 01/30/19  Yes Tillery, Satira Mccallum, PA-C  Multiple Vitamin (MULTIVITAMIN WITH MINERALS) TABS tablet Take 1 tablet by mouth daily. 10/25/18  Yes Irene Pap N, DO  potassium chloride SA (K-DUR) 20 MEQ tablet Take 2 tablets (40 mEq total) by mouth daily. 05/01/19  Yes Larey Dresser, MD  spironolactone (ALDACTONE) 25 MG tablet Take 1 tablet (25 mg total) by mouth daily. 05/01/19  Yes Larey Dresser, MD  thiamine 100 MG tablet Take 1 tablet (100 mg total) by mouth daily. 10/25/18  Yes Kayleen Memos, DO  torsemide (DEMADEX) 20 MG tablet Take 2 tablets (40 mg total) by mouth 2 (two) times daily. 01/30/19  Yes Shirley Friar, PA-C  ibuprofen (ADVIL) 600 MG tablet Take 1 tablet (600 mg total) by mouth every 6 (six) hours as needed. 03/25/19   Charlann Lange, PA-C    Inpatient Medications: Scheduled Meds:  amiodarone  100 mg Oral Daily   amLODipine  10 mg Oral Daily   apixaban  5 mg Oral BID   atorvastatin  40 mg Oral q1800   bisoprolol  5 mg Oral Daily   Chlorhexidine Gluconate Cloth  6 each Topical Q0600   cholecalciferol  1,000 Units Oral Daily   clopidogrel  75 mg Oral Daily   folic acid  1 mg Oral Daily   insulin aspart  0-15 Units Subcutaneous TID WC   insulin aspart  0-5 Units Subcutaneous QHS   insulin aspart  4 Units Subcutaneous TID WC   insulin glargine  30 Units Subcutaneous QHS   levothyroxine  50 mcg Oral QAC breakfast   multivitamin with minerals  1 tablet Oral Daily   mupirocin ointment  1 application Nasal BID   nicotine  21 mg Transdermal Daily   thiamine  100 mg Oral Daily   torsemide  40 mg Oral BID   traZODone  50 mg Oral Once   Continuous Infusions:  cefTRIAXone (ROCEPHIN)  IV Stopped (06/23/19 1345)   vancomycin Stopped (06/23/19 1728)   PRN Meds: acetaminophen **OR** acetaminophen,  HYDROcodone-acetaminophen  Allergies:  Allergies  Allergen Reactions   Gabapentin Nausea And Vomiting and Other (See Comments)    POSSIBLE SHAKING? TAKING MED CURRENTLY   Lyrica [Pregabalin] Other (See Comments)    Shaking Pt still taking lyrica--"probably will stop taking"     Social History:   Social History   Socioeconomic History   Marital status: Divorced    Spouse name: Not on file   Number of children: 1   Years of education: 12   Highest education level: 12th grade  Occupational History   Occupation: disabled  Scientist, product/process development strain: Somewhat hard   Food insecurity    Worry: Never true    Inability: Never true   Transportation needs    Medical: No    Non-medical: No  Tobacco Use   Smoking status: Current Some Day Smoker    Packs/day: 1.00    Years: 44.00    Pack years: 44.00    Types: E-cigarettes, Cigarettes   Smokeless tobacco: Former Systems developer    Types: Snuff  Substance and Sexual Activity   Alcohol use: Yes    Comment: 11/26/2018 "couple drinks/month maybe"   Drug use: Yes    Types: "Crack" cocaine, Marijuana, Oxycodone    Comment: last used 3 days ago    Sexual activity: Not Currently  Lifestyle   Physical activity    Days per week: Not on file    Minutes per session: Not on file   Stress: Not on file  Relationships   Social connections    Talks on phone: Not on file    Gets together: Not on file    Attends religious service: Not on file    Active member of club or organization: Not on file    Attends meetings of clubs or organizations: Not on file    Relationship status: Not on file   Intimate partner violence    Fear of current or ex partner: Not on file    Emotionally abused: Not on file    Physically abused: Not on file    Forced sexual activity: Not on file  Other Topics Concern   Not on file  Social History Narrative   Lives in [place], in [place] with sister.  They are looking to move but don't have a  place to go yet.      Family History:    Family History  Problem Relation Age of Onset   Hypertension Mother    Diabetes Mother    Cancer Mother        breast, ovarian, colon   Clotting disorder Mother    Heart disease Mother    Heart attack Mother    Breast cancer Mother        in 34's   Hypertension Father    Heart disease Father    Emphysema Sister        smoked     ROS:  Please see the history of present illness.  General:no colds or fevers, no weight changes Skin:no rashes or ulcers HEENT:no blurred vision, no congestion CV:see HPI PUL:see HPI GI:no diarrhea constipation or melena, no indigestion GU:no hematuria, no dysuria MS:no joint pain, no claudication Neuro:no syncope, no lightheadedness Endo:+ diabetes, no thyroid disease  All other ROS reviewed and negative.     Physical Exam/Data:   Vitals:   06/23/19 1841 06/23/19 1926 06/24/19 0327 06/24/19 0806  BP: (!) 116/59 109/65 (!) 124/54 (!) 141/59  Pulse: 68 69 72 73  Resp:  15 17 17  Temp: 98.1 F (36.7 C) 99.2 F (37.3 C)  98.5 F (36.9 C)  TempSrc: Oral Oral  Oral  SpO2: 95% 94% 97% 98%    Intake/Output Summary (Last 24 hours) at 06/24/2019 0829 Last data filed at 06/24/2019 0300 Gross per 24 hour  Intake 1157.98 ml  Output --  Net 1157.98 ml   Last 3 Weights 06/20/2019 04/14/2019 03/25/2019  Weight (lbs) 227 lb 235 lb 239 lb  Weight (kg) 102.967 kg 106.595 kg 108.41 kg     There is no height or weight on file to calculate BMI.  Exam per Dr. Gwenlyn Found  Relevant CV Studies: 04/13/17 PV angiogram  Angiographic Data:   1: Abdominal aortogram-the distal abdominal aorta was free of significant atherosclerotic changes 2: Iliac arteries-widely patent 3: Right lower extremity-occluded right SFA from just beyond the origin with reconstitution in the adductor canal. There did not appear to be any name of both vessels below the knee. The peroneal filled by collaterals.  IMPRESSION: [name] has an occluded right SFA similar to her anatomy years ago after I revascularized her. She now has a gangrenous right second toe. She also has a gangrenous left heel. We will proceed with attempt at revascularization of the right SFA to provide collateral filling hopefully to facilitate healing of a right second toe amputation.  Procedure Description: Contralateral access was obtained with a 7 French 55 cm multipurpose Ansel  Sheath (second order catheter placement). The patient received a total of 17,000 units of heparin intravenously with a ACT of 246. Total contrast administered to the patient was 230 mL. Because of the patient's size she did receive 44 minutes of fluoroscopy time which was over the limit.  I was able to cross the majority of the CTO with a Viance CTO catheter and 14 [place] core wire. Unfortunately I was not able to reenter the distal vessel beyond the distal cap. I then exchanged for a 035 angled Navicross catheter along with a 035 long stiff angle Glidewire. I was able to reenter the distal vessel and exchanged for [place] core wire. I exchanged the angle Glidewire for a 014 [place] core wire. I then placed a 6 mm spider distal protection device in the below the knee popliteal artery. I predilated the long CTO with a 2 mm x 220 mm balloon and used a Hawk 1 Drexel atherectomy device to perform multiple circumferential cuts throughout the entirety of the occluded segment removing a copious amount of atherosclerotic plaque. I finally performed drug-eluting balloon angioplasty with overlapping 5 mm x 150 mm long drug-eluting balloons at nominal pressures were 20 half minutes. I retrieved the distal protection device and performed completion angiography which was notable for widely patent SFA. There was occlusion of the below the knee popliteal artery with only collateral filling of the peroneal artery.  Final Impression: Successful Hawk 1 directional atherectomy using spider distal  protection followed by drug-eluting balloon angioplasty of a long right SFA CTO in the setting of critical limb ischemia with a gangrenous right second toe. I do not think she has the possibility of opening a tibial vessel although I suspect that she may heal a second toe amputation by collateral filling given her widely patent SFA. She did do this 2 years ago. The Ansel sheath was withdrawn across the bifurcation and exchanged over an 035 wire for a short 7 [place] sheath. This was then secured in place. The patient left the lab in stable condition.  Quay Burow. MD, [facility] 04/13/2017  10:02 AM  Dopplers  05/30/19 Summary: Right: Total occlusion noted in the superficial femoral artery. Reconstituted flow in the distal SFA/above-knee popliteal area. Total occlusion of the posterior tibial artery. Suspected occlusion of the peroneal artery. Moderate to severe progression is noted from previous study.  ABIs 05/30/19 Right ABIs appear decreased compared to prior study on 04/05/18. Left ABIs appear essentially unchanged compared to prior study on 04/05/18.   Summary: Right: Resting right ankle-brachial index indicates severe right lower extremity arterial disease.  Left: Resting left ankle-brachial index indicates moderate left lower extremity arterial disease by ratio but severe disease based on waveforms and symptoms.  Echo 10/22/18 Study Conclusions  - Left ventricle: The cavity size was mildly dilated. There was   mild concentric hypertrophy. Systolic function was mildly   reduced. The estimated ejection fraction was in the range of 45%   to 50%. Wall motion was normal; there were no regional wall   motion abnormalities. Features are consistent with a pseudonormal   left ventricular filling pattern, with concomitant abnormal   relaxation and increased filling pressure (grade 2 diastolic   dysfunction). Doppler parameters are consistent with high   ventricular filling pressure. -  Aortic valve: Transvalvular velocity was within the normal range.   There was no stenosis. There was no regurgitation. - Mitral valve: Transvalvular velocity was within the normal range.   There was no evidence for stenosis. There was mild regurgitation. - Left atrium: The atrium was severely dilated. - Right ventricle: The cavity size was normal. Wall thickness was   normal. Systolic function was normal. - Tricuspid valve: There was mild regurgitation. - Pulmonary arteries: Systolic pressure was moderately to severely   increased. PA peak pressure: 57 mm Hg (S).  Carotid dopplers 05/30/19 Summary: Right Carotid: Evidence consistent with a total occlusion of the right ICA. The                ECA appears >50% stenosed.  Left Carotid: Velocities in the left ICA are consistent with a 1-39% stenosis.  Vertebrals:  Bilateral vertebral arteries demonstrate antegrade flow. Subclavians: Normal flow hemodynamics were seen in bilateral subclavian              arteries.  No changes    Laboratory Data:  High Sensitivity Troponin:  No results for input(s): TROPONINIHS in the last 720 hours.   Cardiac EnzymesNo results for input(s): TROPONINI in the last 168 hours. No results for input(s): TROPIPOC in the last 168 hours.  Chemistry Recent Labs  Lab 06/22/19 0302 06/23/19 0651 06/24/19 0435  NA 138 134* 136  K 4.5 4.0 3.3*  CL 102 96* 96*  CO2 22 22 27   GLUCOSE 180* 187* 200*  BUN 88* 87* 90*  CREATININE 1.82* 1.78* 1.85*  CALCIUM 9.0 8.6* 8.4*  GFRNONAA 31* 31* 30*  GFRAA 35* 36* 35*  ANIONGAP 14 16* 13    No results for input(s): PROT, ALBUMIN, AST, ALT, ALKPHOS, BILITOT in the last 168 hours. Hematology Recent Labs  Lab 06/22/19 0302 06/23/19 0651 06/24/19 0435  WBC 9.4 11.3* 12.7*  RBC 3.23* 3.29* 3.14*  HGB 9.7* 9.7* 9.5*  HCT 31.0* 30.6* 28.8*  MCV 96.0 93.0 91.7  MCH 30.0 29.5 30.3  MCHC 31.3 31.7 33.0  RDW 13.2 13.0 12.8  PLT 297 319 280   BNPNo results for  input(s): BNP, PROBNP in the last 168 hours.  DDimer No results for input(s): DDIMER in the last 168 hours.   Radiology/Studies:  Ct Foot  Left Wo Contrast  Result Date: 06/21/2019 CLINICAL DATA:  Wound on the left little toe for 3 weeks. EXAM: CT OF THE LEFT FOOT WITHOUT CONTRAST TECHNIQUE: Multidetector CT imaging of the left foot was performed according to the standard protocol. Multiplanar CT image reconstructions were also generated. COMPARISON:  None. 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 Loss of cortical bone is also seen in the head of the proximal phalanx consistent with osteomyelitis. No other acute bony abnormality is identified. Negative for fracture or dislocation. Mild first MTP osteoarthritis noted. Ligaments Suboptimally assessed by CT. Muscles and Tendons No intramuscular fluid collection. Mild atrophy of intrinsic musculature noted. No tendon tear. Soft tissues Small amount of gas is seen in the soft tissues of the little toe. IMPRESSION: Findings consistent with osteomyelitis in the head of the proximal phalanx and throughout the middle phalanx of the little toe. 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 Negative for abscess or evidence of septic joint. Electronically Signed   By: Inge Rise M.D.   On: 06/21/2019 12:23   Dg Foot Complete Left  Result Date: 06/21/2019 CLINICAL DATA:  Wound on the left little toe.  Left foot pain. EXAM: LEFT FOOT - COMPLETE 3+ VIEW COMPARISON:  None. FINDINGS: Soft tissues of the little toe are swollen and there is some soft tissue gas. No bony destructive change or periosteal reaction. No radiopaque foreign body. Atherosclerosis noted. No fracture or dislocation. IMPRESSION: Soft tissue gas and swelling in the little toe consistent with  infection. Negative for radiopaque foreign body or evidence of osteomyelitis. Electronically Signed   By: Inge Rise M.D.   On: 06/21/2019 09:12    Assessment and Plan:   1. PAD with stenting on Rt lower ext, and now with abnormal dopplers on Lt - Dr. Gwenlyn Found to see, pt is NPO for possible PV angiogram though did have eliquis last pm 2. Lt 5th toe with infected diabetic ulcer and osteomyelitis on ABX and ortho has seen.   3. CKD-3 at baseline.  4. Chronic combined diastolic and systolic CHF stable, last EF 45-50% on home dose torsemide.  Losartan and aldactone held due to elevated K+ on admit.   5. PAF on amiodarone and Eliquis  Last dose 06/23/19 at 2315. 6. DM Insulin dependent per primary team 7. Tobacco use, understands importance of stopping.  8. Hx of ray amputation of her Rt foot.       For questions or updates, please contact [facility] Please consult [email or web address] for contact info under     Signed, Cecilie Kicks, NP  06/24/2019 8:29 AM  Agree with note written by Cecilie Kicks RNP  Patient well-known to me status post several interventions on the right SFA now known to be occluded by recent Doppler.  She has ongoing tobacco abuse, diabetes and chronic renal sufficiency.  She is had a ray amputation of her right foot in the past.  She was admitted 06/21/2019 with a wound on her left fifth toe.  She is on antibiotics.  Her creatinine is in the 1 8 range.  The wound began about 2 weeks ago.  I reviewed her angiogram which I performed in 2018 that showed a total SFA originating just beyond the origin with reconstitution in the adductor canal by profunda femoris collaterals.  She has a diffusely diseased anterior tibial.  I do not think the wound will heal without inline flow.  Unfortunately, she did take her Eliquis last night precluding angiography this morning.  I discussed the case with Dr. Fletcher Anon who has agreed to study her on Wednesday morning.  Quay Burow 06/24/2019 12:47  PM

## 2019-06-24 NOTE — Care Management Important Message (Signed)
Important Message  Patient Details  Name: [name] MRN: [id number] Date of Birth: May 31, 1961   Medicare Important Message Given:  Yes     Memory Argue 06/24/2019, 4:11 PM

## 2019-06-24 NOTE — Progress Notes (Signed)
ANTICOAGULATION CONSULT NOTE - Initial Consult  Pharmacy Consult for Heparin (Eliquis on hold) Indication: atrial fibrillation  Allergies  Allergen Reactions  . Gabapentin Nausea And Vomiting and Other (See Comments)    POSSIBLE SHAKING? TAKING MED CURRENTLY  . Lyrica [Pregabalin] Other (See Comments)    Shaking Pt still taking lyrica--"probably will stop taking"     Patient Measurements: Height: 5\' 4"  (162.6 cm) Weight: 227 lb (103 kg) IBW/kg (Calculated) : 54.7 Heparin Dosing Weight: 80 kg  Vital Signs: Temp: 98.5 F (36.9 C) (08/10 0806) Temp Source: Oral (08/10 0806) BP: 141/59 (08/10 0806) Pulse Rate: 73 (08/10 0806)  Labs: Recent Labs    06/22/19 0302 06/23/19 0651 06/24/19 0435  HGB 9.7* 9.7* 9.5*  HCT 31.0* 30.6* 28.8*  PLT 297 319 280  CREATININE 1.82* 1.78* 1.85*    Estimated Creatinine Clearance: 39.7 mL/min (A) (by C-G formula based on SCr of 1.85 mg/dL (H)).   Medical History: Past Medical History:  Diagnosis Date  . Alcohol abuse   . Alcoholic cirrhosis ([place])   . Anemia   . Anxiety   . Arthritis    "knees" (11/26/2018)  . B12 deficiency   . CAD (coronary artery disease)    a. cath 11/10/2014 LM nl, LAD min irregs, D1 30 ost, D2 50d, LCX 47m, OM1 80 p/m (1.5 mm vessel), OM2 34m, RCA nondom 54m-->med rx.. Demand ischemia in the setting of rapid a-fib.  . Cardiomyopathy ([place])   . Carotid artery disease ([place])    a. 01/2015 Carotid Angio: RICA [id number], LICA [id number]. s/p L carotid endarterectomy 02/2015.  . Cellulitis    lower extremities  . Chronic combined systolic and diastolic CHF (congestive heart failure) ([place])    a. Last echo 12/205: EF 40-45%, inferoapical/posterior HK, not technically sufficient to allow eval of LV diastolic function, normal LA in size..  . Cocaine abuse ([place])   . COPD (chronic obstructive pulmonary disease) ([place])   . Critical lower limb ischemia   . Depression   . Diabetic peripheral neuropathy ([place])   . Elevated troponin    a. Chronic elevation.  [name] GERD (gastroesophageal reflux disease)   . Hyperlipemia   . Hypertension   . Hypokalemia   . Hypomagnesemia   . Hypothyroidism   . Marijuana abuse   . Narcotic abuse ([place])   . Noncompliance   . NSVT (nonsustained ventricular tachycardia) ([place])   . Obesity   . PAF (paroxysmal atrial fibrillation) ([place])   . Paroxysmal atrial tachycardia ([place])   . Peripheral arterial disease ([place])    a. 01/2015 Angio/PTA: LSFA 100 w/ recon @ adductor canal and 1 vessel runoff via AT, RSFA 99 (atherectomy/pta) - 1 vessel runoff via diff dzs peroneal.  . Pneumonia    "once or twice" (11/26/2018)  . Poorly controlled type 2 diabetes mellitus ([place])   . Renal insufficiency    a. 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  [name] Sleep apnea    "couldn't handle wearing the mask" (11/26/2018)  . Symptomatic bradycardia    avoid AV blocking agent per EP  . Tobacco abuse    Assessment:   58 yr old female on Eliquis prior to admission for atrial fibrillation.  Eliquis continued on admission 8/7, but now stopped for possible PV angiogram.  To begin IV heparin while off Eliquis.  Last Eliquis dose 8/9 at 2315.     Greater than 12 hrs since last Eliquis dose.  Baseline aPTT and heparin level ordered.  Expect falsely elevated heparin level, and plan to  use aPTTs for heparin monitoring.  Goal of Therapy:  Heparin level 0.3-0.7 units/ml aPTT 66-102 seconds Monitor platelets by anticoagulation protocol: Yes   Plan:   Baseline aPTT and heparin level.  Begin Heparin drip at 1100 units/hr.  aPTT ~8hrs after heparin begins.  Daily aPTT, heparin level, and CBC.  Follow up for possible PV angiogram and holding IV heparin for procedure.  Eliquis on hold.   Arty Baumgartner, [place] Pager: [phone] or phone: [phone] 06/24/2019,12:45 PM

## 2019-06-24 NOTE — Consult Note (Addendum)
Cardiology Consultation:   Patient ID: SHALYNN MANALAC MRN: [id number]; DOB: 1960-11-22  Admit date: 06/21/2019 Date of Consult: 06/24/2019  Primary Care Provider: Tsosie Billing, MD (Inactive) Primary Cardiologist: Quay Burow, MD  Primary Electrophysiologist:  None    Patient Profile:   [name] is a 58 y.o. female with a hx of NICM, CHF, EF 45%, PAF on eliquis and amiodarone, IDDM,  Extensive vascular disease and now admitted with osteomyelitis of who is being seen today for the evaluation of PAD, with admit for Lt 5th toe infected diabetic ulcer and osteomyelitis at the request of Dr. Avon Gully.  History of Present Illness:   [name] with above hx and NICM with EF 45%, PAF on eliquis and amiodarone, PAD, continued tobacco use and IDDM.  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  + foul- smelling odor,     She had a right SFA intervention in 2014, a left carotid endarterectomy in April 2016 had a redo right SFA intervention in May 2018. 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  Recent elevated sCr.  Non healing Lt 5th toe ulcer LEA dopplers done July 2020 which revealed progression of her PVD.  She was seen in the office 06/20/19 and Dr. Gwenlyn Found reviewed angiograms.  Possible candidate for PV angiogram once her SCr. Is improved for limb salvage.    Labs today Cr 1.85, K+ 3.3 GFR 30  Na 136 Hgb 9.5, WBV 12.7 and plts 280.  EKG:  The EKG was personally reviewed and demonstrates:  SR at 61 no acute sT changes  Old Q wave in V2 Telemetry:  Telemetry was personally reviewed and demonstrates:  SR 3 beats on NSVT.    VS now 141/59 P 73 afebrile She is NPO   Heart Pathway Score:     Past Medical History:  Diagnosis Date   Alcohol abuse    Alcoholic cirrhosis ([name])    Anemia    Anxiety    Arthritis    "knees" (11/26/2018)   B12 deficiency    CAD (coronary artery disease)    a. cath 11/10/2014 LM nl, LAD min irregs, D1 30 ost, D2 50d, LCX 24m, OM1  80 p/m (1.5 mm vessel), OM2 7m, RCA nondom 45m-->med rx.. Demand ischemia in the setting of rapid a-fib.   Cardiomyopathy ([place])    Carotid artery disease ([place])    a. 01/2015 Carotid Angio: RICA [id number], LICA [id number]. s/p L carotid endarterectomy 02/2015.   Cellulitis    lower extremities   Chronic combined systolic and diastolic CHF (congestive heart failure) ([place])    a. Last echo 12/205: EF 40-45%, inferoapical/posterior HK, not technically sufficient to allow eval of LV diastolic function, normal LA in size..   Cocaine abuse ([name])    COPD (chronic obstructive pulmonary disease) ([name])    Critical lower limb ischemia    Depression    Diabetic peripheral neuropathy ([name])    Elevated troponin    a. Chronic elevation.   GERD (gastroesophageal reflux disease)    Hyperlipemia    Hypertension    Hypokalemia    Hypomagnesemia    Hypothyroidism    Marijuana abuse    Narcotic abuse ([place])    Noncompliance    NSVT (nonsustained ventricular tachycardia) ([name])    Obesity    PAF (paroxysmal atrial fibrillation) ([name])    Paroxysmal atrial tachycardia ([place])    Peripheral arterial disease ([place])    a. 01/2015 Angio/PTA: LSFA 100 w/ recon @ adductor canal and  1 vessel runoff via AT, RSFA 99 (atherectomy/pta) - 1 vessel runoff via diff dzs peroneal.   Pneumonia    "once or twice" (11/26/2018)   Poorly controlled type 2 diabetes mellitus ([place])    Renal insufficiency    a. 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   Sleep apnea    "couldn't handle wearing the mask" (11/26/2018)   Symptomatic bradycardia    avoid AV blocking agent per EP   Tobacco abuse     Past Surgical History:  Procedure Laterality Date   AMPUTATION Right 06/14/2017   Procedure: Right foot transmetatarsal amputation;  Surgeon: Newt Minion, MD;  Location: [facility];  Service: Orthopedics;  Laterality: Right;   AMPUTATION TOE Right 04/28/2017   Procedure: AMPUTATION OF RIGHT SECOND RAY;  Surgeon: Newt Minion, MD;  Location:  [facility];  Service: Orthopedics;  Laterality: Right;   CARDIAC CATHETERIZATION     CARDIAC CATHETERIZATION N/A 01/12/2016   Procedure: Temporary Wire;  Surgeon: Minus Breeding, MD;  Location: [facility] [place] CV LAB;  Service: Cardiovascular;  Laterality: N/A;   CARDIOVERSION  ~ 02/2013   "twice"    CAROTID ANGIOGRAM N/A 01/15/2015   Procedure: CAROTID ANGIOGRAM;  Surgeon: [name], MD;  [facility];  Service: Cardiovascular;  Laterality: N/A;   DILATION AND CURETTAGE OF UTERUS  1988   ENDARTERECTOMY Left 02/19/2015   Procedure: LEFT CAROTID ENDARTERECTOMY ;  Surgeon: Serafina Mitchell, MD;  Location: [facility];  Service: Vascular;  Laterality: Left;   LEFT HEART CATHETERIZATION WITH CORONARY ANGIOGRAM N/A 10/31/2014   Procedure: LEFT HEART CATHETERIZATION WITH CORONARY ANGIOGRAM;  Surgeon: Burnell Blanks, MD;  Location: [facility] CATH LAB;  Service: Cardiovascular;  Laterality: N/A;   LOWER EXTREMITY ANGIOGRAM N/A 09/10/2013   Procedure: LOWER EXTREMITY ANGIOGRAM;  Surgeon: [name], MD;  Location: [facility] CATH LAB;  Service: Cardiovascular;  Laterality: N/A;   LOWER EXTREMITY ANGIOGRAM N/A 01/15/2015   Procedure: LOWER EXTREMITY ANGIOGRAM;  Surgeon: [name], MD;  Location: [facility] LAB;  Service: Cardiovascular;  Laterality: N/A;   LOWER EXTREMITY ANGIOGRAPHY N/A 04/13/2017   Procedure: Lower Extremity Angiography;  Surgeon: [name], MD;  Location: [place] CV LAB;  Service: Cardiovascular;  Laterality: N/A;   PERIPHERAL VASCULAR INTERVENTION  04/13/2017   Procedure: Peripheral Vascular Intervention;  Surgeon: [name], MD;  Location: [place] CV LAB;  Service: Cardiovascular;;     Home Medications:  Prior to Admission medications   Medication Sig Start Date End Date Taking? Authorizing Provider  acetaminophen (TYLENOL) 500 MG tablet Take 1,000 mg by mouth every 6 (six) hours as needed for mild pain.   Yes [provider]  albuterol  (PROVENTIL HFA;VENTOLIN HFA) 108 (90 Base) MCG/ACT inhaler Inhale 2 puffs into the lungs every 6 (six) hours as needed for wheezing or shortness of breath. 08/09/17  Yes Medina-Vargas, Monina C, NP  amiodarone (PACERONE) 100 MG tablet TAKE 1 TABLET (100 MG TOTAL) BY MOUTH DAILY Patient taking differently: Take 100 mg by mouth daily.  09/20/18  Yes Larey Dresser, MD  amLODipine (NORVASC) 10 MG tablet TAKE 1 TABLET (10 MG TOTAL) BY MOUTH DAILY. 05/01/19  Yes Larey Dresser, MD  apixaban (ELIQUIS) 5 MG TABS tablet Take 1 tablet (5 mg total) by mouth 2 (two) times daily. 08/09/17  Yes Medina-Vargas, Monina C, NP  Aspirin-Caffeine (BC FAST PAIN RELIEF PO) Take 1 Package by mouth as needed (headache).   Yes [provider]  atorvastatin (LIPITOR) 40  MG tablet TAKE 1 TABLET BY MOUTH ONCE EVERY EVENING Patient taking differently: Take 40 mg by mouth daily. TAKE 1 TABLET BY MOUTH ONCE EVERY EVENING 08/09/17  Yes Medina-Vargas, Monina C, NP  bisoprolol (ZEBETA) 5 MG tablet Take 1 tablet (5 mg total) by mouth daily. 05/01/19  Yes Larey Dresser, MD  cholecalciferol (VITAMIN D) 1000 units tablet Take 1,000 Units by mouth daily.    Yes [provider]  clopidogrel (PLAVIX) 75 MG tablet Take 75 mg by mouth daily.   Yes [provider]  folic acid (FOLVITE) 1 MG tablet Take 1 tablet (1 mg total) by mouth daily. Patient taking differently: Take 1 mg by mouth daily.  10/25/18  Yes Kayleen Memos, DO  Insulin Glargine (LANTUS SOLOSTAR) 100 UNIT/ML Solostar Pen Inject 40 Units into the skin at bedtime.    Yes [provider]  Insulin Lispro (HUMALOG KWIKPEN) 200 UNIT/ML SOPN Inject 15 Units into the skin 3 (three) times daily with meals.    Yes [provider]  levothyroxine (SYNTHROID, LEVOTHROID) 50 MCG tablet Take 1 tablet (50 mcg total) by mouth daily. 08/09/17  Yes Medina-Vargas, Monina C, NP  losartan (COZAAR) 100 MG tablet TAKE 1 TABLET (100 MG TOTAL) BY MOUTH DAILY.  04/02/19  Yes Bensimhon, Shaune Pascal, MD  metolazone (ZAROXOLYN) 2.5 MG tablet Take 1 tablet (2.5 mg total) by mouth once a week. On Thursdays 01/30/19  Yes Tillery, Satira Mccallum, PA-C  Multiple Vitamin (MULTIVITAMIN WITH MINERALS) TABS tablet Take 1 tablet by mouth daily. 10/25/18  Yes Irene Pap N, DO  potassium chloride SA (K-DUR) 20 MEQ tablet Take 2 tablets (40 mEq total) by mouth daily. 05/01/19  Yes Larey Dresser, MD  spironolactone (ALDACTONE) 25 MG tablet Take 1 tablet (25 mg total) by mouth daily. 05/01/19  Yes Larey Dresser, MD  thiamine 100 MG tablet Take 1 tablet (100 mg total) by mouth daily. 10/25/18  Yes Kayleen Memos, DO  torsemide (DEMADEX) 20 MG tablet Take 2 tablets (40 mg total) by mouth 2 (two) times daily. 01/30/19  Yes Shirley Friar, PA-C  ibuprofen (ADVIL) 600 MG tablet Take 1 tablet (600 mg total) by mouth every 6 (six) hours as needed. 03/25/19   Charlann Lange, PA-C    Inpatient Medications: Scheduled Meds:  amiodarone  100 mg Oral Daily   amLODipine  10 mg Oral Daily   apixaban  5 mg Oral BID   atorvastatin  40 mg Oral q1800   bisoprolol  5 mg Oral Daily   Chlorhexidine Gluconate Cloth  6 each Topical Q0600   cholecalciferol  1,000 Units Oral Daily   clopidogrel  75 mg Oral Daily   folic acid  1 mg Oral Daily   insulin aspart  0-15 Units Subcutaneous TID WC   insulin aspart  0-5 Units Subcutaneous QHS   insulin aspart  4 Units Subcutaneous TID WC   insulin glargine  30 Units Subcutaneous QHS   levothyroxine  50 mcg Oral QAC breakfast   multivitamin with minerals  1 tablet Oral Daily   mupirocin ointment  1 application Nasal BID   nicotine  21 mg Transdermal Daily   thiamine  100 mg Oral Daily   torsemide  40 mg Oral BID   traZODone  50 mg Oral Once   Continuous Infusions:  cefTRIAXone (ROCEPHIN)  IV Stopped (06/23/19 1345)   vancomycin Stopped (06/23/19 1728)   PRN Meds: acetaminophen **OR** acetaminophen,  HYDROcodone-acetaminophen  Allergies:  Allergies  Allergen Reactions   Gabapentin Nausea And Vomiting and Other (See Comments)    POSSIBLE SHAKING? TAKING MED CURRENTLY   Lyrica [Pregabalin] Other (See Comments)    Shaking Pt still taking lyrica--"probably will stop taking"     Social History:   Social History   Socioeconomic History   Marital status: Divorced    Spouse name: Not on file   Number of children: 1   Years of education: 12   Highest education level: 12th grade  Occupational History   Occupation: disabled  Scientist, product/process development strain: Somewhat hard   Food insecurity    Worry: Never true    Inability: Never true   Transportation needs    Medical: No    Non-medical: No  Tobacco Use   Smoking status: Current Some Day Smoker    Packs/day: 1.00    Years: 44.00    Pack years: 44.00    Types: E-cigarettes, Cigarettes   Smokeless tobacco: Former Systems developer    Types: Snuff  Substance and Sexual Activity   Alcohol use: Yes    Comment: 11/26/2018 "couple drinks/month maybe"   Drug use: Yes    Types: "Crack" cocaine, Marijuana, Oxycodone    Comment: last used 3 days ago    Sexual activity: Not Currently  Lifestyle   Physical activity    Days per week: Not on file    Minutes per session: Not on file   Stress: Not on file  Relationships   Social connections    Talks on phone: Not on file    Gets together: Not on file    Attends religious service: Not on file    Active member of club or organization: Not on file    Attends meetings of clubs or organizations: Not on file    Relationship status: Not on file   Intimate partner violence    Fear of current or ex partner: Not on file    Emotionally abused: Not on file    Physically abused: Not on file    Forced sexual activity: Not on file  Other Topics Concern   Not on file  Social History Narrative   Lives in [place], in [place] with sister.  They are looking to move but don't have a  place to go yet.      Family History:    Family History  Problem Relation Age of Onset   Hypertension Mother    Diabetes Mother    Cancer Mother        breast, ovarian, colon   Clotting disorder Mother    Heart disease Mother    Heart attack Mother    Breast cancer Mother        in 62's   Hypertension Father    Heart disease Father    Emphysema Sister        smoked     ROS:  Please see the history of present illness.  General:no colds or fevers, no weight changes Skin:no rashes or ulcers HEENT:no blurred vision, no congestion CV:see HPI PUL:see HPI GI:no diarrhea constipation or melena, no indigestion GU:no hematuria, no dysuria MS:no joint pain, no claudication Neuro:no syncope, no lightheadedness Endo:+ diabetes, no thyroid disease  All other ROS reviewed and negative.     Physical Exam/Data:   Vitals:   06/23/19 1841 06/23/19 1926 06/24/19 0327 06/24/19 0806  BP: (!) 116/59 109/65 (!) 124/54 (!) 141/59  Pulse: 68 69 72 73  Resp:  15 17 17  Temp: 98.1 F (36.7 C) 99.2 F (37.3 C)  98.5 F (36.9 C)  TempSrc: Oral Oral  Oral  SpO2: 95% 94% 97% 98%    Intake/Output Summary (Last 24 hours) at 06/24/2019 0829 Last data filed at 06/24/2019 0300 Gross per 24 hour  Intake 1157.98 ml  Output --  Net 1157.98 ml   Last 3 Weights 06/20/2019 04/14/2019 03/25/2019  Weight (lbs) 227 lb 235 lb 239 lb  Weight (kg) 102.967 kg 106.595 kg 108.41 kg     There is no height or weight on file to calculate BMI.  Exam per Dr. Gwenlyn Found  Relevant CV Studies: 04/13/17 PV angiogram  Angiographic Data:   1: Abdominal aortogram-the distal abdominal aorta was free of significant atherosclerotic changes 2: Iliac arteries-widely patent 3: Right lower extremity-occluded right SFA from just beyond the origin with reconstitution in the adductor canal. There did not appear to be any name of both vessels below the knee. The peroneal filled by collaterals.  IMPRESSION: [name] has an occluded right SFA similar to her anatomy years ago after I revascularized her. She now has a gangrenous right second toe. She also has a gangrenous left heel. We will proceed with attempt at revascularization of the right SFA to provide collateral filling hopefully to facilitate healing of a right second toe amputation.  Procedure Description: Contralateral access was obtained with a 7 French 55 cm multipurpose Ansel  Sheath (second order catheter placement). The patient received a total of 17,000 units of heparin intravenously with a ACT of 246. Total contrast administered to the patient was 230 mL. Because of the patient's size she did receive 44 minutes of fluoroscopy time which was over the limit.  I was able to cross the majority of the CTO with a Viance CTO catheter and 14 [place] core wire. Unfortunately I was not able to reenter the distal vessel beyond the distal cap. I then exchanged for a 035 angled Navicross catheter along with a 035 long stiff angle Glidewire. I was able to reenter the distal vessel and exchanged for Sparta core wire. I exchanged the angle Glidewire for a 014 [place] core wire. I then placed a 6 mm spider distal protection device in the below the knee popliteal artery. I predilated the long CTO with a 2 mm x 220 mm balloon and used a Hawk 1 Drexel atherectomy device to perform multiple circumferential cuts throughout the entirety of the occluded segment removing a copious amount of atherosclerotic plaque. I finally performed drug-eluting balloon angioplasty with overlapping 5 mm x 150 mm long drug-eluting balloons at nominal pressures were 20 half minutes. I retrieved the distal protection device and performed completion angiography which was notable for widely patent SFA. There was occlusion of the below the knee popliteal artery with only collateral filling of the peroneal artery.  Final Impression: Successful Hawk 1 directional atherectomy using spider distal  protection followed by drug-eluting balloon angioplasty of a long right SFA CTO in the setting of critical limb ischemia with a gangrenous right second toe. I do not think she has the possibility of opening a tibial vessel although I suspect that she may heal a second toe amputation by collateral filling given her widely patent SFA. She did do this 2 years ago. The Ansel sheath was withdrawn across the bifurcation and exchanged over an 035 wire for a short 7 [place] sheath. This was then secured in place. The patient left the lab in stable condition.  Quay Burow. MD, [facility] 04/13/2017  10:02 AM  Dopplers  05/30/19 Summary: Right: Total occlusion noted in the superficial femoral artery. Reconstituted flow in the distal SFA/above-knee popliteal area. Total occlusion of the posterior tibial artery. Suspected occlusion of the peroneal artery. Moderate to severe progression is noted from previous study.  ABIs 05/30/19 Right ABIs appear decreased compared to prior study on 04/05/18. Left ABIs appear essentially unchanged compared to prior study on 04/05/18.   Summary: Right: Resting right ankle-brachial index indicates severe right lower extremity arterial disease.  Left: Resting left ankle-brachial index indicates moderate left lower extremity arterial disease by ratio but severe disease based on waveforms and symptoms.  Echo 10/22/18 Study Conclusions  - Left ventricle: The cavity size was mildly dilated. There was   mild concentric hypertrophy. Systolic function was mildly   reduced. The estimated ejection fraction was in the range of 45%   to 50%. Wall motion was normal; there were no regional wall   motion abnormalities. Features are consistent with a pseudonormal   left ventricular filling pattern, with concomitant abnormal   relaxation and increased filling pressure (grade 2 diastolic   dysfunction). Doppler parameters are consistent with high   ventricular filling pressure. -  Aortic valve: Transvalvular velocity was within the normal range.   There was no stenosis. There was no regurgitation. - Mitral valve: Transvalvular velocity was within the normal range.   There was no evidence for stenosis. There was mild regurgitation. - Left atrium: The atrium was severely dilated. - Right ventricle: The cavity size was normal. Wall thickness was   normal. Systolic function was normal. - Tricuspid valve: There was mild regurgitation. - Pulmonary arteries: Systolic pressure was moderately to severely   increased. PA peak pressure: 57 mm Hg (S).  Carotid dopplers 05/30/19 Summary: Right Carotid: Evidence consistent with a total occlusion of the right ICA. The                ECA appears >50% stenosed.  Left Carotid: Velocities in the left ICA are consistent with a 1-39% stenosis.  Vertebrals:  Bilateral vertebral arteries demonstrate antegrade flow. Subclavians: Normal flow hemodynamics were seen in bilateral subclavian              arteries.  No changes    Laboratory Data:  High Sensitivity Troponin:  No results for input(s): TROPONINIHS in the last 720 hours.   Cardiac EnzymesNo results for input(s): TROPONINI in the last 168 hours. No results for input(s): TROPIPOC in the last 168 hours.  Chemistry Recent Labs  Lab 06/22/19 0302 06/23/19 0651 06/24/19 0435  NA 138 134* 136  K 4.5 4.0 3.3*  CL 102 96* 96*  CO2 22 22 27   GLUCOSE 180* 187* 200*  BUN 88* 87* 90*  CREATININE 1.82* 1.78* 1.85*  CALCIUM 9.0 8.6* 8.4*  GFRNONAA 31* 31* 30*  GFRAA 35* 36* 35*  ANIONGAP 14 16* 13    No results for input(s): PROT, ALBUMIN, AST, ALT, ALKPHOS, BILITOT in the last 168 hours. Hematology Recent Labs  Lab 06/22/19 0302 06/23/19 0651 06/24/19 0435  WBC 9.4 11.3* 12.7*  RBC 3.23* 3.29* 3.14*  HGB 9.7* 9.7* 9.5*  HCT 31.0* 30.6* 28.8*  MCV 96.0 93.0 91.7  MCH 30.0 29.5 30.3  MCHC 31.3 31.7 33.0  RDW 13.2 13.0 12.8  PLT 297 319 280   BNPNo results for  input(s): BNP, PROBNP in the last 168 hours.  DDimer No results for input(s): DDIMER in the last 168 hours.   Radiology/Studies:  Ct Foot  Left Wo Contrast  Result Date: 06/21/2019 CLINICAL DATA:  Wound on the left little toe for 3 weeks. EXAM: CT OF THE LEFT FOOT WITHOUT CONTRAST TECHNIQUE: Multidetector CT imaging of the left foot was performed according to the standard protocol. Multiplanar CT image reconstructions were also generated. COMPARISON:  None. 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 Loss of cortical bone is also seen in the head of the proximal phalanx consistent with osteomyelitis. No other acute bony abnormality is identified. Negative for fracture or dislocation. Mild first MTP osteoarthritis noted. Ligaments Suboptimally assessed by CT. Muscles and Tendons No intramuscular fluid collection. Mild atrophy of intrinsic musculature noted. No tendon tear. Soft tissues Small amount of gas is seen in the soft tissues of the little toe. IMPRESSION: Findings consistent with osteomyelitis in the head of the proximal phalanx and throughout the middle phalanx of the little toe. 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 Negative for abscess or evidence of septic joint. Electronically Signed   By: Inge Rise M.D.   On: 06/21/2019 12:23   Dg Foot Complete Left  Result Date: 06/21/2019 CLINICAL DATA:  Wound on the left little toe.  Left foot pain. EXAM: LEFT FOOT - COMPLETE 3+ VIEW COMPARISON:  None. FINDINGS: Soft tissues of the little toe are swollen and there is some soft tissue gas. No bony destructive change or periosteal reaction. No radiopaque foreign body. Atherosclerosis noted. No fracture or dislocation. IMPRESSION: Soft tissue gas and swelling in the little toe consistent with  infection. Negative for radiopaque foreign body or evidence of osteomyelitis. Electronically Signed   By: Inge Rise M.D.   On: 06/21/2019 09:12    Assessment and Plan:   1. PAD with stenting on Rt lower ext, and now with abnormal dopplers on Lt - Dr. Gwenlyn Found to see, pt is NPO for possible PV angiogram though did have eliquis last pm 2. Lt 5th toe with infected diabetic ulcer and osteomyelitis on ABX and ortho has seen.   3. CKD-3 at baseline.  4. Chronic combined diastolic and systolic CHF stable, last EF 45-50% on home dose torsemide.  Losartan and aldactone held due to elevated K+ on admit.   5. PAF on amiodarone and Eliquis  Last dose 06/23/19 at 2315. 6. DM Insulin dependent per primary team 7. Tobacco use, understands importance of stopping.  8. Hx of ray amputation of her Rt foot.       For questions or updates, please contact [place] Please consult [email or web address] for contact info under     Signed, Cecilie Kicks, NP  06/24/2019 8:29 AM  Agree with note written by Cecilie Kicks RNP  Patient well-known to me status post several interventions on the right SFA now known to be occluded by recent Doppler.  She has ongoing tobacco abuse, diabetes and chronic renal sufficiency.  She is had a ray amputation of her right foot in the past.  She was admitted 06/21/2019 with a wound on her left fifth toe.  She is on antibiotics.  Her creatinine is in the 1 8 range.  The wound began about 2 weeks ago.  I reviewed her angiogram which I performed in 2018 that showed a total SFA originating just beyond the origin with reconstitution in the adductor canal by profunda femoris collaterals.  She has a diffusely diseased anterior tibial.  I do not think the wound will heal without inline flow.  Unfortunately, she did take her Eliquis last night precluding angiography this morning.  I discussed the case with Dr. Fletcher Anon who has agreed to study her on Wednesday morning.  Quay Burow 06/24/2019 12:47  PM

## 2019-06-24 NOTE — Progress Notes (Signed)
[place] for Heparin (Eliquis on hold) Indication: atrial fibrillation  Allergies  Allergen Reactions  . Gabapentin Nausea And Vomiting and Other (See Comments)    POSSIBLE SHAKING? TAKING MED CURRENTLY  . Lyrica [Pregabalin] Other (See Comments)    Shaking Pt still taking lyrica--"probably will stop taking"     Patient Measurements: Height: 5\' 4"  (162.6 cm) Weight: 227 lb (103 kg) IBW/kg (Calculated) : 54.7 Heparin Dosing Weight: 80 kg  Vital Signs: Temp: 98.9 F (37.2 C) (08/10 1943) Temp Source: Oral (08/10 1943) BP: 130/64 (08/10 1943) Pulse Rate: 76 (08/10 1943)  Labs: Recent Labs    06/22/19 0302 06/23/19 0651 06/24/19 0435 06/24/19 1432 06/24/19 2044  HGB 9.7* 9.7* 9.5*  --   --   HCT 31.0* 30.6* 28.8*  --   --   PLT 297 319 280  --   --   APTT  --   --   --  39* 49*  HEPARINUNFRC  --   --   --  >2.20*  --   CREATININE 1.82* 1.78* 1.85*  --   --     Estimated Creatinine Clearance: 39.7 mL/min (A) (by C-G formula based on SCr of 1.85 mg/dL (H)).   Assessment: 58 yr old female on Eliquis prior to admission for atrial fibrillation.  Eliquis continued on admission 8/7, but now stopped for possible PV angiogram.  To begin IV heparin while off Eliquis.  Last Eliquis dose 8/9 at 2315.  Initial PTT 49 seconds    Goal of Therapy:  Heparin level 0.3-0.7 units/ml aPTT 66-102 seconds Monitor platelets by anticoagulation protocol: Yes   Plan:  Increase heparin to 1300 units / hr Follow up AM PTT  Thank you Anette Guarneri, PharmD [phone] 06/24/2019,10:24 PM

## 2019-06-24 NOTE — Plan of Care (Signed)

## 2019-06-25 ENCOUNTER — Telehealth (HOSPITAL_COMMUNITY): Payer: Self-pay | Admitting: Licensed Clinical Social Worker

## 2019-06-25 LAB — BASIC METABOLIC PANEL
Anion gap: 15 (ref 5–15)
BUN: 90 mg/dL — ABNORMAL HIGH (ref 6–20)
CO2: 27 mmol/L (ref 22–32)
Calcium: 8.7 mg/dL — ABNORMAL LOW (ref 8.9–10.3)
Chloride: 92 mmol/L — ABNORMAL LOW (ref 98–111)
Creatinine, Ser: 1.74 mg/dL — ABNORMAL HIGH (ref 0.44–1.00)
GFR calc Af Amer: 37 mL/min — ABNORMAL LOW (ref >60)
GFR calc non Af Amer: 32 mL/min — ABNORMAL LOW (ref >60)
Glucose, Bld: 184 mg/dL — ABNORMAL HIGH (ref 70–99)
Potassium: 3.4 mmol/L — ABNORMAL LOW (ref 3.5–5.1)
Sodium: 134 mmol/L — ABNORMAL LOW (ref 135–145)

## 2019-06-25 LAB — CBC
HCT: 29.4 % — ABNORMAL LOW (ref 36.0–46.0)
Hemoglobin: 9.7 g/dL — ABNORMAL LOW (ref 12.0–15.0)
MCH: 30.2 pg (ref 26.0–34.0)
MCHC: 33 g/dL (ref 30.0–36.0)
MCV: 91.6 fL (ref 80.0–100.0)
Platelets: 331 10*3/uL (ref 150–400)
RBC: 3.21 MIL/uL — ABNORMAL LOW (ref 3.87–5.11)
RDW: 13 % (ref 11.5–15.5)
WBC: 14.7 10*3/uL — ABNORMAL HIGH (ref 4.0–10.5)
nRBC: 0 % (ref 0.0–0.2)

## 2019-06-25 LAB — APTT
aPTT: 58 seconds — ABNORMAL HIGH (ref 24–36)
aPTT: 60 seconds — ABNORMAL HIGH (ref 24–36)
aPTT: 60 seconds — ABNORMAL HIGH (ref 24–36)

## 2019-06-25 LAB — GLUCOSE, CAPILLARY
Glucose-Capillary: 241 mg/dL — ABNORMAL HIGH (ref 70–99)
Glucose-Capillary: 292 mg/dL — ABNORMAL HIGH (ref 70–99)
Glucose-Capillary: 374 mg/dL — ABNORMAL HIGH (ref 70–99)
Glucose-Capillary: 285 mg/dL — ABNORMAL HIGH (ref 70–99)

## 2019-06-25 LAB — HEPARIN LEVEL (UNFRACTIONATED)
Heparin Unfractionated: >2.2 IU/mL — ABNORMAL HIGH (ref 0.30–0.70)
Heparin Unfractionated: >2.2 IU/mL — ABNORMAL HIGH (ref 0.30–0.70)

## 2019-06-25 MED ORDER — POTASSIUM CHLORIDE CRYS ER 20 MEQ PO TBCR
40.0000 meq | EXTENDED_RELEASE_TABLET | Freq: Once | ORAL | Status: AC
  Administered 2019-06-25: 11:00:00 40 meq via ORAL
  Filled 2019-06-25: qty 2

## 2019-06-25 MED ORDER — SODIUM CHLORIDE 0.9 % IV SOLN
INTRAVENOUS | Status: DC
  Administered 2019-06-26: 01:00:00 via INTRAVENOUS

## 2019-06-25 MED ORDER — INSULIN GLARGINE 100 UNIT/ML ~~LOC~~ SOLN
20.0000 [IU] | Freq: Two times a day (BID) | SUBCUTANEOUS | Status: DC
  Administered 2019-06-25: 20 [IU] via SUBCUTANEOUS
  Filled 2019-06-25 (×3): qty 0.2

## 2019-06-25 MED ORDER — HYDROCODONE-ACETAMINOPHEN 5-325 MG PO TABS
1.0000 | ORAL_TABLET | ORAL | Status: DC | PRN
  Administered 2019-06-25: 1 via ORAL
  Administered 2019-06-25 – 2019-07-07 (×29): 2 via ORAL
  Filled 2019-06-25 (×31): qty 2

## 2019-06-25 NOTE — Progress Notes (Signed)
ANTICOAGULATION + ANTIBIOTIC CONSULT NOTE - Follow Up Consult  Pharmacy Consult for Heparin (Eliquis on hold) ; Vancomycin Indication: atrial fibrillation ; osteomyelitis  Allergies  Allergen Reactions  . Gabapentin Nausea And Vomiting and Other (See Comments)    POSSIBLE SHAKING? TAKING MED CURRENTLY  . Lyrica [Pregabalin] Other (See Comments)    Shaking Pt still taking lyrica--"probably will stop taking"     Patient Measurements: Height: 5\' 4"  (162.6 cm) Weight: 227 lb (103 kg) IBW/kg (Calculated) : 54.7 Heparin Dosing Weight: 80 kg  Vital Signs: Temp: 98.8 F (37.1 C) (08/11 0854) Temp Source: Oral (08/11 0854) BP: 140/67 (08/11 0854) Pulse Rate: 77 (08/11 0854)  Labs: Recent Labs    06/23/19 0651 06/24/19 0435  06/24/19 1432 06/24/19 2044 06/25/19 0318 06/25/19 1042  HGB 9.7* 9.5*  --   --   --  9.7*  --   HCT 30.6* 28.8*  --   --   --  29.4*  --   PLT 319 280  --   --   --  331  --   APTT  --   --    < > 39* 49* 58* 60*  HEPARINUNFRC  --   --   --  >2.20*  --  >2.20* >2.20*  CREATININE 1.78* 1.85*  --   --   --  1.74*  --    < > = values in this interval not displayed.    Estimated Creatinine Clearance: 42.2 mL/min (A) (by C-G formula based on SCr of 1.74 mg/dL (H)).  Assessment:  58 yr old female on Eliquis prior to admission for atrial fibrillation.  Eliquis continued on admission 8/7, but now stopped for PV angiogram, planned for 8/1 am.  On IV heparin while off Eliquis, begun 8/10 am.  Last Eliquis dose 8/9 at 2315.    APTT remains subtherapeutic on heparin drip at 1500 units/hr. Heparin level >2.20 is falsely elevated from recent Eliquis doses.    Day # 4 vancomycin and ceftriaxone for left fifth toe osteomyelitis.    Afebrile, WBC 14.7. CKD3, creatinine 2.18>>1.74, Vanc regimen remains appropriate.   Estimated AUC: 481   8/7 surgical PCR positive for MRSA, on CHG/Bactroban x 5 days (8/8>>8/12).   8/7 blood cultures: no growth x 3 days to  date.  Goal of Therapy:  Heparin level 0.3-0.7 units/ml aPTT 66-102 seconds Monitor platelets by anticoagulation protocol: Yes  AUC for Vancomycin: 400-550   Plan:   Increase heparin drip to 1700 units/hr.  aPTT ~6 hrs after rate change.  Daily aPTT, heparin level, and CBC.  Eliquis on hold for PV angiogram on 8/12 am.  Continue Vancomycin 1500 mg IV q48hrs, dose due this afternoon.  Continue Ceftriaxone 2 gm IV q24hrs.  Follow renal function, culture data, clinical progress and antibiotic plans.  Will consider Vanc levels later this week if Vanc expected to continue beyond the weekend.  Arty Baumgartner, [place] Pager: [phone] or phone: [phone] 06/25/2019,12:27 PM

## 2019-06-25 NOTE — Plan of Care (Signed)
  Problem: Clinical Measurements: Goal: Will remain free from infection Outcome: Progressing   Problem: Activity: Goal: Risk for activity intolerance will decrease Outcome: Progressing   Problem: Nutrition: Goal: Adequate nutrition will be maintained Outcome: Progressing   Problem: Coping: Goal: Level of anxiety will decrease Outcome: Progressing   Problem: Elimination: Goal: Will not experience complications related to bowel motility Outcome: Progressing   Problem: Pain Managment: Goal: General experience of comfort will improve Outcome: Progressing   Problem: Safety: Goal: Ability to remain free from injury will improve Outcome: Progressing   Problem: Skin Integrity: Goal: Risk for impaired skin integrity will decrease Outcome: Progressing   

## 2019-06-25 NOTE — Progress Notes (Signed)
Progress Note   Patient Demographics:    [name], is a 58 y.o. female  MRN: [id number]   DOB - 11/29/1960  Admit Date - 06/21/2019  Outpatient Primary MD for the patient is Tsosie Billing, MD (Inactive)  Outpatient Specialists: Patient been followed by Dr. Alvester Chou in the past regarding PVD of bilateral lower extremity with multiple angiograms and stent placed, she did see vascular Dr. Trula Slade for carotid stenosis, she did see Dr. Sharol Given in the past for right transmetatarsal amputation.  Patient coming from: home   HPI:    [name]  is a 58 y.o. female, with past medical history of diabetes mellitus, tobacco abuse, still smoking, CAD, peripheral vascular disease s/p angiogram and stent by Dr. Gwenlyn Found, A. Fib on Eliquis, COPD, cirrhosis, OSA, CKD stage III, hypertension. She presents to ED secondary to nonhealing left fifth toe wounds, as well sent by her PCP for hyperkalemia, apparently patient had potassium of 5.9 yesterday at PCP office, she was asked to come to ED, in ED potassium was 5, she is on Aldactone, losartan and potassium supplement, as well she was noted to have left fifth toe nonhealing ulcer, with foul-smelling odor, report is been for last 3 weeks, she has not been given any antibiotics, denies fever, chills, shortness of breath. X-ray significant for osteomyelitis of left fifth toe, potassium of 5, and reddening of 2, white blood cell of 12,000, hospitalist called to admit.    Subjective    Patient complaining of right upper extremity intractable arm pain around IV site.  Otherwise no acute issues or events overnight.  Patient has been avoiding weightbearing on her left foot which has remarkably improved her pain. Otherwise patient denies chest pain, shortness of breath, nausea, vomiting, diarrhea, constipation, headache, fever, chills.   Assessment & Plan:  Active Problems:   DM (diabetes  mellitus), type 2 with peripheral vascular complications ([name])   Carotid arterial disease ([name])   Coronary artery disease   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   Chronic combined systolic and diastolic CHF (congestive heart failure) ([name])   COPD GOLD 0    CKD (chronic kidney disease), stage III ([name])   CHF (congestive heart failure) ([name])   Osteomyelitis ([name])  Left fifth toe with infected diabetic ulcer and osteomyelitis -X-ray significant for osteomyelitis -Blood cultures remain negative to date -Continue vancomycin and Rocephin. (MRSA nares positive) -Orthopedic consult greatly appreciated, will need PAD to be addressed first - as below  PAD/PVD -Known history of peripheral vascular disease; s/p R transmetatarsal amputation -Previous eval/treatment with vascular (Dr. Alvester Chou) in the past -Pending evaluation on Wed 06/26/19 -Hold eliquis for possible procedure/evaluation - started on heparin gtt for coverage -Continue with statin, plavix for now  Chronic combined diastolic and systolic CHF, not in acute exacerbation -Echo 10/2018 with EF 45-50% (improved from40-45% 11/2016). -Appears to be euvolemic, continue with home dose torsemide -We will hold losartan, and Aldactone given hyperkalemia; albeit mild  Hyperkalemia,mild, resolved -Hold Aldactone, losartan and potassium supplement -Potassium currently quite stable, pressure well controlled off these medications -potentially could  consider discontinuing them at discharge pending vitals and potassium level over the next few days  Diabetes mellitus, insulin dependent, moderately well controlled -Discontinue pre-meal insulin, increase long-acting to 20 units twice daily -Continue sliding scale insulin, hypoglycemic protocol.  CKD 3, stable - better than baseline -Baseline appears to be around 2.2 per previous records - currently 1.7-1.8  Paroxysmal atrial fibrillation: -With normal sinus rhythm, continue with amiodarone and hold Eliquis as  above -Unclear if patient needs chronic anticoagulation if paroxysmal afib and remains in sinus rhythm throughout stay - defer to outpatient cardiology for further eval/treatment.  Essential hypertension, well controlled - Continue with home medication, losartan on hold given hyperkalemia  Hyperlipidemia -Continue atorvastatin  Hypothyroidism: -Continue with levothyroxine  Tobacco abuse:  - Was counseled at length given above vascular disease, started on nicotine patch  DVT Prophylaxis heparin gtt Code Status Full code Dispo: Likely DC to  Home pending further eval and workup as above Condition GUARDED   Consults called: Orthopedic sx, cardiology/vascular Admission status: inpatient Time spent in minutes : >35 minutes   Home Medications:   Prior to Admission medications   Medication Sig Start Date End Date Taking? Authorizing Provider  acetaminophen (TYLENOL) 500 MG tablet Take 1,000 mg by mouth every 6 (six) hours as needed for mild pain.   Yes [provider]  albuterol (PROVENTIL HFA;VENTOLIN HFA) 108 (90 Base) MCG/ACT inhaler Inhale 2 puffs into the lungs every 6 (six) hours as needed for wheezing or shortness of breath. 08/09/17  Yes Medina-Vargas, Monina C, NP  amiodarone (PACERONE) 100 MG tablet TAKE 1 TABLET (100 MG TOTAL) BY MOUTH DAILY Patient taking differently: Take 100 mg by mouth daily.  09/20/18  Yes Larey Dresser, MD  amLODipine (NORVASC) 10 MG tablet TAKE 1 TABLET (10 MG TOTAL) BY MOUTH DAILY. 05/01/19  Yes Larey Dresser, MD  apixaban (ELIQUIS) 5 MG TABS tablet Take 1 tablet (5 mg total) by mouth 2 (two) times daily. 08/09/17  Yes Medina-Vargas, Monina C, NP  Aspirin-Caffeine (BC FAST PAIN RELIEF PO) Take 1 Package by mouth as needed (headache).   Yes [provider]  atorvastatin (LIPITOR) 40 MG tablet TAKE 1 TABLET BY MOUTH ONCE EVERY EVENING Patient taking differently: Take 40 mg by mouth daily. TAKE 1 TABLET BY MOUTH ONCE EVERY EVENING  08/09/17  Yes Medina-Vargas, Monina C, NP  bisoprolol (ZEBETA) 5 MG tablet Take 1 tablet (5 mg total) by mouth daily. 05/01/19  Yes Larey Dresser, MD  cholecalciferol (VITAMIN D) 1000 units tablet Take 1,000 Units by mouth daily.    Yes [provider]  clopidogrel (PLAVIX) 75 MG tablet Take 75 mg by mouth daily.   Yes [provider]  folic acid (FOLVITE) 1 MG tablet Take 1 tablet (1 mg total) by mouth daily. Patient taking differently: Take 1 mg by mouth daily.  10/25/18  Yes Kayleen Memos, DO  Insulin Glargine (LANTUS SOLOSTAR) 100 UNIT/ML Solostar Pen Inject 40 Units into the skin at bedtime.    Yes [provider]  Insulin Lispro (HUMALOG KWIKPEN) 200 UNIT/ML SOPN Inject 15 Units into the skin 3 (three) times daily with meals.    Yes [provider]  levothyroxine (SYNTHROID, LEVOTHROID) 50 MCG tablet Take 1 tablet (50 mcg total) by mouth daily. 08/09/17  Yes Medina-Vargas, Monina C, NP  losartan (COZAAR) 100 MG tablet TAKE 1 TABLET (100 MG TOTAL) BY MOUTH DAILY. 04/02/19  Yes Bensimhon, Shaune Pascal, MD  metolazone (ZAROXOLYN) 2.5 MG tablet Take  1 tablet (2.5 mg total) by mouth once a week. On Thursdays 01/30/19  Yes Tillery, Satira Mccallum, PA-C  Multiple Vitamin (MULTIVITAMIN WITH MINERALS) TABS tablet Take 1 tablet by mouth daily. 10/25/18  Yes Irene Pap N, DO  potassium chloride SA (K-DUR) 20 MEQ tablet Take 2 tablets (40 mEq total) by mouth daily. 05/01/19  Yes Larey Dresser, MD  spironolactone (ALDACTONE) 25 MG tablet Take 1 tablet (25 mg total) by mouth daily. 05/01/19  Yes Larey Dresser, MD  thiamine 100 MG tablet Take 1 tablet (100 mg total) by mouth daily. 10/25/18  Yes Kayleen Memos, DO  torsemide (DEMADEX) 20 MG tablet Take 2 tablets (40 mg total) by mouth 2 (two) times daily. 01/30/19  Yes Shirley Friar, PA-C  ibuprofen (ADVIL) 600 MG tablet Take 1 tablet (600 mg total) by mouth every 6 (six) hours as needed. 03/25/19   Charlann Lange,  PA-C     Allergies:     Allergies  Allergen Reactions  . Gabapentin Nausea And Vomiting and Other (See Comments)    POSSIBLE SHAKING? TAKING MED CURRENTLY  . Lyrica [Pregabalin] Other (See Comments)    Shaking Pt still taking lyrica--"probably will stop taking"      Physical Exam:   Vitals  Blood pressure 127/64, pulse 74, temperature 98.8 F (37.1 C), temperature source Oral, resp. rate 15, height 5\' 4"  (1.626 m), weight 103 kg, last menstrual period 11/27/2012, SpO2 98 %.   General well-developed female, laying in bed in no apparent distress Normal affect and insight, Not Suicidal or Homicidal, Awake Alert, Oriented X 3. No F.N deficits, ALL C.Nerves Intact, Strength 5/5 all 4 extremities, Sensation intact all 4 extremities, Plantars down going. Ears and Eyes appear Normal, Conjunctivae clear, PERRLA. Moist Oral Mucosa. Supple Neck, No JVD, No cervical lymphadenopathy appriciated, No Carotid Bruits. Symmetrical Chest wall movement, Good air movement bilaterally, CTAB. RRR, No Gallops, Rubs or Murmurs, No Parasternal Heave.  +1 edema Positive Bowel Sounds, Abdomen Soft, No tenderness, No organomegaly appriciated,No rebound guarding or rigidity. 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 No Palpable Lymph Nodes in Neck or Axillae      Data Review:    CBC Recent Labs  Lab 06/20/19 1125 06/21/19 0811 06/22/19 0302 06/23/19 0651 06/24/19 0435 06/25/19 0318  WBC 12.4* 12.0* 9.4 11.3* 12.7* 14.7*  HGB 9.7* 9.4* 9.7* 9.7* 9.5* 9.7*  HCT 30.3* 31.4* 31.0* 30.6* 28.8* 29.4*  PLT 342 316 297 319 280 331  MCV 93 100.0 96.0 93.0 91.7 91.6  MCH 29.8 29.9 30.0 29.5 30.3 30.2  MCHC 32.0 29.9* 31.3 31.7 33.0 33.0  RDW 12.7 13.4 13.2 13.0 12.8 13.0  LYMPHSABS 1.6 1.7  --   --   --   --   MONOABS  --  0.9  --   --   --   --   EOSABS 0.3 0.3  --   --   --    --   BASOSABS 0.0 0.0  --   --   --   --    ------------------------------------------------------------------------------------------------------------------  Chemistries  Recent Labs  Lab 06/21/19 0811 06/22/19 0302 06/23/19 0651 06/24/19 0435 06/25/19 0318  NA 137 138 134* 136 134*  K 5.0 4.5 4.0 3.3* 3.4*  CL 107 102 96* 96* 92*  CO2 17* 22 22 27 27   GLUCOSE 217* 180* 187* 200* 184*  BUN 95*  88* 87* 90* 90*  CREATININE 2.07* 1.82* 1.78* 1.85* 1.74*  CALCIUM 9.1 9.0 8.6* 8.4* 8.7*   ------------------------------------------------------------------------------------------------------------------ estimated creatinine clearance is 42.2 mL/min (A) (by C-G formula based on SCr of 1.74 mg/dL (H)). ---------------------------------------------------------------------------------------------------------------  Urinalysis    Component Value Date/Time   COLORURINE YELLOW 03/18/2019 1540   APPEARANCEUR CLEAR 03/18/2019 1540   LABSPEC 1.013 03/18/2019 1540   PHURINE 5.0 03/18/2019 1540   GLUCOSEU NEGATIVE 03/18/2019 1540   HGBUR NEGATIVE 03/18/2019 [place] 03/18/2019 1540   KETONESUR NEGATIVE 03/18/2019 1540   PROTEINUR NEGATIVE 03/18/2019 1540   UROBILINOGEN 0.2 09/05/2015 0012   NITRITE NEGATIVE 03/18/2019 1540   LEUKOCYTESUR NEGATIVE 03/18/2019 1540    ----------------------------------------------------------------------------------------------------------------   Imaging Results:    No results found.  [name] DO on 06/25/2019 at 8:04 AM  Between 7am to 7pm - Pager - [phone]. After 7pm go to [email or web address] - password [facility]  [facility] - Office  [phone]

## 2019-06-25 NOTE — Progress Notes (Signed)
Inpatient Diabetes Program Recommendations  AACE/ADA: New Consensus Statement on Inpatient Glycemic Control (2015)  Target Ranges:  Prepandial:   less than 140 mg/dL      Peak postprandial:   less than 180 mg/dL (1-2 hours)      Critically ill patients:  140 - 180 mg/dL    Review of Glycemic Control Results for [name], [name] (MRN [id number]) as of 06/25/2019 16:37  Ref. Range 06/24/2019 07:33 06/24/2019 11:49 06/24/2019 16:23 06/24/2019 21:32 06/25/2019 07:28 06/25/2019 11:55 06/25/2019 16:22  Glucose-Capillary Latest Ref Range: 70 - 99 mg/dL 205 (H) 174 (H) 234 (H) 242 (H) 241 (H) 292 (H) 374 (H)   Diabetes history: DM 2 Outpatient Diabetes medications: Humalog 15 units tid, Lantus 40 units qhs Current orders for Inpatient glycemic control:  Lantus 20 units bid Novolog 0-15 units tid Novolog 0-5 units qhs  A1c 7.2% on 8/7  Inpatient Diabetes Program Recommendations:    Patient may benefit from meal coverage since postprandial glucose trends increase into the 300's. Patient also take meal coverage at home.  Thanks,  Tama Headings RN, MSN, BC-ADM Inpatient Diabetes Coordinator Team Pager [phone] (8a-5p)

## 2019-06-25 NOTE — Telephone Encounter (Signed)
Patient had been on community paramedicine program and graduated because she no longer needed assistance.  Patient now in the hospital and will have multiple med changes following discharge requiring extra support to help patient adjust her pill packs and ensure she is taking everything correctly.   CSW discussed with APP provider and placed referral for pt to be added back to paramedicine program temporarily to ensure smooth transition as outpatient.  CSW will continue to follow and assist as needed  Jorge Ny, [place] [facility] Desk#: [phone] Cell#: [phone]

## 2019-06-25 NOTE — Progress Notes (Signed)
[place] for Heparin (Eliquis on hold) Indication: atrial fibrillation  Allergies  Allergen Reactions  . Gabapentin Nausea And Vomiting and Other (See Comments)    POSSIBLE SHAKING? TAKING MED CURRENTLY  . Lyrica [Pregabalin] Other (See Comments)    Shaking Pt still taking lyrica--"probably will stop taking"     Patient Measurements: Height: 5\' 4"  (162.6 cm) Weight: 227 lb (103 kg) IBW/kg (Calculated) : 54.7 Heparin Dosing Weight: 80 kg  Vital Signs: Temp: 98.5 F (36.9 C) (08/11 1424) Temp Source: Oral (08/11 1424) BP: 152/70 (08/11 1424) Pulse Rate: 86 (08/11 1424)  Labs: Recent Labs    06/23/19 0651 06/24/19 0435 06/24/19 1432  06/25/19 0318 06/25/19 1042 06/25/19 1926  HGB 9.7* 9.5*  --   --  9.7*  --   --   HCT 30.6* 28.8*  --   --  29.4*  --   --   PLT 319 280  --   --  331  --   --   APTT  --   --  39*   < > 58* 60* 60*  HEPARINUNFRC  --   --  >2.20*  --  >2.20* >2.20*  --   CREATININE 1.78* 1.85*  --   --  1.74*  --   --    < > = values in this interval not displayed.    Estimated Creatinine Clearance: 42.2 mL/min (A) (by C-G formula based on SCr of 1.74 mg/dL (H)).   Assessment: 58 yr old female on Eliquis prior to admission for atrial fibrillation.  Eliquis continued on admission 8/7, but now stopped for possible PV angiogram.  To begin IV heparin while off Eliquis.  Last Eliquis dose 8/9 at 2315.  PTT = 60 seconds    Goal of Therapy:  Heparin level 0.3-0.7 units/ml aPTT 66-102 seconds Monitor platelets by anticoagulation protocol: Yes   Plan:  Increase heparin to 1900 units / hr 8 hour PTT  Thank you Anette Guarneri, PharmD [phone] 06/25/2019,7:54 PM

## 2019-06-25 NOTE — Progress Notes (Signed)
[place] for Heparin (Eliquis on hold) Indication: atrial fibrillation  Allergies  Allergen Reactions  . Gabapentin Nausea And Vomiting and Other (See Comments)    POSSIBLE SHAKING? TAKING MED CURRENTLY  . Lyrica [Pregabalin] Other (See Comments)    Shaking Pt still taking lyrica--"probably will stop taking"     Patient Measurements: Height: 5\' 4"  (162.6 cm) Weight: 227 lb (103 kg) IBW/kg (Calculated) : 54.7 Heparin Dosing Weight: 80 kg  Vital Signs: Temp: 98.8 F (37.1 C) (08/11 0335) Temp Source: Oral (08/11 0335) BP: 127/64 (08/11 0335) Pulse Rate: 74 (08/11 0335)  Labs: Recent Labs    06/23/19 0651 06/24/19 0435 06/24/19 1432 06/24/19 2044 06/25/19 0318  HGB 9.7* 9.5*  --   --  9.7*  HCT 30.6* 28.8*  --   --  29.4*  PLT 319 280  --   --  331  APTT  --   --  39* 49* 58*  HEPARINUNFRC  --   --  >2.20*  --   --   CREATININE 1.78* 1.85*  --   --   --     Estimated Creatinine Clearance: 39.7 mL/min (A) (by C-G formula based on SCr of 1.85 mg/dL (H)).   Assessment: 58 yr old female on Eliquis prior to admission for atrial fibrillation.  Eliquis continued on admission 8/7, but now stopped for possible PV angiogram.  To begin IV heparin while off Eliquis.  Last Eliquis dose 8/9 at 2315.  APTT subtherapeutic at 58 s/p rate increase, rate confirmed with RN no issues    Goal of Therapy:  Heparin level 0.3-0.7 units/ml aPTT 66-102 seconds Monitor platelets by anticoagulation protocol: Yes   Plan:  Increase heparin gtt to 1500 units/hr F/u 6 hour aPTT/HL  Bertis Ruddy, PharmD Clinical Pharmacist Please check AMION for all [place] numbers 06/25/2019 4:14 AM

## 2019-06-25 NOTE — Plan of Care (Signed)
  Problem: Pain Managment: Goal: General experience of comfort will improve Outcome: Progressing   Problem: Elimination: Goal: Will not experience complications related to bowel motility Outcome: Progressing   

## 2019-06-25 NOTE — Progress Notes (Signed)
Pt complained of severe pain 10/10 on R arm on the IV site, flushed with NS, no occlusion, no swelling, no redness noted. Started new PIV on R arm and removed old PIV. Pt given heat and cold pack but Pt stated they are not helping her pain. PRN med given but Pt continued to complain 10/10 pain. MD notified, new orders received and carried out. Rechecked Pt at 17:27, Pt stated her pain is a little bit better and now a 8/10 but Pt fell asleep when RN was still in the room. At 19:00 Pt in bed, asleep. Will endorse accordingly.

## 2019-06-25 NOTE — Progress Notes (Signed)
PIV consult: Site obtained by pt's nurse.

## 2019-06-26 ENCOUNTER — Encounter (HOSPITAL_COMMUNITY): Payer: Self-pay | Admitting: Cardiovascular Disease

## 2019-06-26 ENCOUNTER — Encounter (HOSPITAL_COMMUNITY)
Admission: EM | Disposition: A | Discharge status: Skilled Nursing Facility | Payer: Self-pay | Source: Home / Self Care | Attending: Internal Medicine

## 2019-06-26 ENCOUNTER — Ambulatory Visit: Payer: Medicare Other | Admitting: Podiatry

## 2019-06-26 DIAGNOSIS — I70202 Unspecified atherosclerosis of native arteries of extremities, left leg: Secondary | ICD-10-CM

## 2019-06-26 HISTORY — PX: LOWER EXTREMITY ANGIOGRAPHY: CATH118251

## 2019-06-26 HISTORY — PX: ABDOMINAL AORTOGRAM: CATH118222

## 2019-06-26 HISTORY — PX: PERIPHERAL VASCULAR BALLOON ANGIOPLASTY: CATH118281

## 2019-06-26 LAB — POCT ACTIVATED CLOTTING TIME
Activated Clotting Time: 246 seconds
Activated Clotting Time: 279 seconds

## 2019-06-26 LAB — CULTURE, BLOOD (ROUTINE X 2)
Culture: NO GROWTH
Culture: NO GROWTH
Special Requests: ADEQUATE
Special Requests: ADEQUATE

## 2019-06-26 LAB — GLUCOSE, CAPILLARY
Glucose-Capillary: 309 mg/dL — ABNORMAL HIGH (ref 70–99)
Glucose-Capillary: 442 mg/dL — ABNORMAL HIGH (ref 70–99)
Glucose-Capillary: 432 mg/dL — ABNORMAL HIGH (ref 70–99)
Glucose-Capillary: 454 mg/dL — ABNORMAL HIGH (ref 70–99)

## 2019-06-26 LAB — BASIC METABOLIC PANEL
Anion gap: 14 (ref 5–15)
BUN: 66 mg/dL — ABNORMAL HIGH (ref 6–20)
CO2: 28 mmol/L (ref 22–32)
Calcium: 8.8 mg/dL — ABNORMAL LOW (ref 8.9–10.3)
Chloride: 91 mmol/L — ABNORMAL LOW (ref 98–111)
Creatinine, Ser: 1.43 mg/dL — ABNORMAL HIGH (ref 0.44–1.00)
GFR calc Af Amer: 47 mL/min — ABNORMAL LOW (ref >60)
GFR calc non Af Amer: 41 mL/min — ABNORMAL LOW (ref >60)
Glucose, Bld: 287 mg/dL — ABNORMAL HIGH (ref 70–99)
Potassium: 3.3 mmol/L — ABNORMAL LOW (ref 3.5–5.1)
Sodium: 133 mmol/L — ABNORMAL LOW (ref 135–145)

## 2019-06-26 LAB — CBC
HCT: 28.1 % — ABNORMAL LOW (ref 36.0–46.0)
Hemoglobin: 9.4 g/dL — ABNORMAL LOW (ref 12.0–15.0)
MCH: 30.2 pg (ref 26.0–34.0)
MCHC: 33.5 g/dL (ref 30.0–36.0)
MCV: 90.4 fL (ref 80.0–100.0)
Platelets: 310 10*3/uL (ref 150–400)
RBC: 3.11 MIL/uL — ABNORMAL LOW (ref 3.87–5.11)
RDW: 12.7 % (ref 11.5–15.5)
WBC: 22.3 10*3/uL — ABNORMAL HIGH (ref 4.0–10.5)
nRBC: 0 % (ref 0.0–0.2)

## 2019-06-26 LAB — HEPARIN LEVEL (UNFRACTIONATED): Heparin Unfractionated: >2.2 IU/mL — ABNORMAL HIGH (ref 0.30–0.70)

## 2019-06-26 LAB — APTT: aPTT: 115 seconds — ABNORMAL HIGH (ref 24–36)

## 2019-06-26 SURGERY — LOWER EXTREMITY ANGIOGRAPHY
Anesthesia: LOCAL

## 2019-06-26 MED ORDER — LIDOCAINE HCL (PF) 1 % IJ SOLN
INTRAMUSCULAR | Status: AC
  Filled 2019-06-26: qty 30

## 2019-06-26 MED ORDER — FENTANYL CITRATE (PF) 100 MCG/2ML IJ SOLN
INTRAMUSCULAR | Status: AC
  Filled 2019-06-26: qty 2

## 2019-06-26 MED ORDER — INSULIN GLARGINE 100 UNIT/ML ~~LOC~~ SOLN
30.0000 [IU] | Freq: Two times a day (BID) | SUBCUTANEOUS | Status: DC
  Administered 2019-06-26 – 2019-06-28 (×4): 30 [IU] via SUBCUTANEOUS
  Filled 2019-06-26 (×7): qty 0.3

## 2019-06-26 MED ORDER — HEPARIN (PORCINE) IN NACL 1000-0.9 UT/500ML-% IV SOLN
INTRAVENOUS | Status: AC
  Filled 2019-06-26: qty 500

## 2019-06-26 MED ORDER — INSULIN ASPART 100 UNIT/ML ~~LOC~~ SOLN
0.0000 [IU] | Freq: Three times a day (TID) | SUBCUTANEOUS | Status: DC
  Administered 2019-06-27: 11 [IU] via SUBCUTANEOUS
  Administered 2019-06-27: 15 [IU] via SUBCUTANEOUS
  Administered 2019-06-27: 7 [IU] via SUBCUTANEOUS
  Administered 2019-06-28: 11 [IU] via SUBCUTANEOUS
  Administered 2019-06-29: 20 [IU] via SUBCUTANEOUS
  Administered 2019-06-29: 11 [IU] via SUBCUTANEOUS
  Administered 2019-06-29: 07:00:00 15 [IU] via SUBCUTANEOUS
  Administered 2019-06-30: 4 [IU] via SUBCUTANEOUS
  Administered 2019-06-30: 7 [IU] via SUBCUTANEOUS
  Administered 2019-06-30: 11 [IU] via SUBCUTANEOUS
  Administered 2019-07-01: 3 [IU] via SUBCUTANEOUS
  Administered 2019-07-01: 11 [IU] via SUBCUTANEOUS
  Administered 2019-07-01: 7 [IU] via SUBCUTANEOUS
  Administered 2019-07-02: 11 [IU] via SUBCUTANEOUS
  Administered 2019-07-02: 7 [IU] via SUBCUTANEOUS
  Administered 2019-07-03: 4 [IU] via SUBCUTANEOUS
  Administered 2019-07-03 (×2): 11 [IU] via SUBCUTANEOUS
  Administered 2019-07-04: 13:00:00 15 [IU] via SUBCUTANEOUS
  Administered 2019-07-04 – 2019-07-05 (×2): 4 [IU] via SUBCUTANEOUS
  Administered 2019-07-06: 3 [IU] via SUBCUTANEOUS
  Administered 2019-07-06 – 2019-07-08 (×3): 7 [IU] via SUBCUTANEOUS
  Administered 2019-07-08: 3 [IU] via SUBCUTANEOUS

## 2019-06-26 MED ORDER — INSULIN ASPART 100 UNIT/ML ~~LOC~~ SOLN
10.0000 [IU] | Freq: Once | SUBCUTANEOUS | Status: AC
  Administered 2019-06-26: 10 [IU] via SUBCUTANEOUS

## 2019-06-26 MED ORDER — SODIUM CHLORIDE 0.9 % IV SOLN
250.0000 mL | INTRAVENOUS | Status: DC | PRN
  Administered 2019-06-28: 09:00:00 via INTRAVENOUS

## 2019-06-26 MED ORDER — HEPARIN SODIUM (PORCINE) 1000 UNIT/ML IJ SOLN
INTRAMUSCULAR | Status: DC | PRN
  Administered 2019-06-26: 4000 [IU] via INTRAVENOUS
  Administered 2019-06-26: 8000 [IU] via INTRAVENOUS
  Administered 2019-06-26: 2000 [IU] via INTRAVENOUS

## 2019-06-26 MED ORDER — SODIUM CHLORIDE 0.9% FLUSH: 3.0000 mL | INTRAVENOUS | Status: DC | PRN

## 2019-06-26 MED ORDER — INSULIN GLARGINE 100 UNIT/ML ~~LOC~~ SOLN
10.0000 [IU] | SUBCUTANEOUS | Status: AC
  Administered 2019-06-26: 10 [IU] via SUBCUTANEOUS
  Filled 2019-06-26: qty 0.1

## 2019-06-26 MED ORDER — VERAPAMIL HCL 2.5 MG/ML IV SOLN
INTRAVENOUS | Status: AC
  Filled 2019-06-26: qty 2

## 2019-06-26 MED ORDER — FENTANYL CITRATE (PF) 100 MCG/2ML IJ SOLN
INTRAMUSCULAR | Status: DC | PRN
  Administered 2019-06-26: 25 ug via INTRAVENOUS
  Administered 2019-06-26 (×2): 50 ug via INTRAVENOUS
  Administered 2019-06-26 (×2): 25 ug via INTRAVENOUS
  Administered 2019-06-26: 50 ug via INTRAVENOUS

## 2019-06-26 MED ORDER — VANCOMYCIN HCL IN DEXTROSE 1-5 GM/200ML-% IV SOLN
1000.0000 mg | INTRAVENOUS | Status: DC
  Administered 2019-06-26: 18:00:00 1000 mg via INTRAVENOUS
  Filled 2019-06-26 (×2): qty 200

## 2019-06-26 MED ORDER — LIDOCAINE HCL (PF) 1 % IJ SOLN
INTRAMUSCULAR | Status: DC | PRN
  Administered 2019-06-26: 18 mL
  Administered 2019-06-26: 2 mL

## 2019-06-26 MED ORDER — MIDAZOLAM HCL 2 MG/2ML IJ SOLN
INTRAMUSCULAR | Status: AC
  Filled 2019-06-26: qty 2

## 2019-06-26 MED ORDER — VERAPAMIL HCL 2.5 MG/ML IV SOLN
INTRAVENOUS | Status: DC | PRN
  Administered 2019-06-26: 12:00:00 3 mL via INTRA_ARTERIAL

## 2019-06-26 MED ORDER — SODIUM CHLORIDE 0.9 % IV SOLN: INTRAVENOUS | Status: AC

## 2019-06-26 MED ORDER — IODIXANOL 320 MG/ML IV SOLN
INTRAVENOUS | Status: DC | PRN
  Administered 2019-06-26: 130 mL via INTRAVENOUS

## 2019-06-26 MED ORDER — HEPARIN (PORCINE) IN NACL 1000-0.9 UT/500ML-% IV SOLN
INTRAVENOUS | Status: DC | PRN
  Administered 2019-06-26 (×3): 500 mL

## 2019-06-26 MED ORDER — SODIUM CHLORIDE 0.9% FLUSH
3.0000 mL | Freq: Two times a day (BID) | INTRAVENOUS | Status: DC
  Administered 2019-06-26 – 2019-06-27 (×3): 3 mL via INTRAVENOUS

## 2019-06-26 MED ORDER — INSULIN ASPART 100 UNIT/ML ~~LOC~~ SOLN
20.0000 [IU] | Freq: Once | SUBCUTANEOUS | Status: AC
  Administered 2019-06-26: 20 [IU] via SUBCUTANEOUS

## 2019-06-26 MED ORDER — HEPARIN (PORCINE) IN NACL 1000-0.9 UT/500ML-% IV SOLN
INTRAVENOUS | Status: AC
  Filled 2019-06-26: qty 1000

## 2019-06-26 MED ORDER — FENTANYL CITRATE (PF) 100 MCG/2ML IJ SOLN
25.0000 ug | INTRAMUSCULAR | Status: DC | PRN
  Administered 2019-06-26 – 2019-07-01 (×7): 25 ug via INTRAVENOUS
  Filled 2019-06-26 (×6): qty 2

## 2019-06-26 MED ORDER — HEPARIN SODIUM (PORCINE) 1000 UNIT/ML IJ SOLN
INTRAMUSCULAR | Status: AC
  Filled 2019-06-26: qty 1

## 2019-06-26 MED ORDER — MIDAZOLAM HCL 2 MG/2ML IJ SOLN
INTRAMUSCULAR | Status: DC | PRN
  Administered 2019-06-26 (×2): 1 mg via INTRAVENOUS

## 2019-06-26 SURGICAL SUPPLY — 32 items
CATH ANGIO 5F PIGTAIL 65CM (CATHETERS) ×3 IMPLANT
CATH CROSS OVER TEMPO 5F (CATHETERS) ×3 IMPLANT
CATH NAVICROSS ST .035X135CM (MICROCATHETER) ×3 IMPLANT
CATH QUICKCROSS .018X135CM (MICROCATHETER) ×3 IMPLANT
CATH STRAIGHT 5FR 65CM (CATHETERS) ×3 IMPLANT
CATH TELEPORT (CATHETERS) ×3 IMPLANT
CLOSURE MYNX CONTROL 6F/7F (Vascular Products) ×3 IMPLANT
DEVICE TORQUE .014-.018 (MISCELLANEOUS) ×2 IMPLANT
DEVICE TORQUE .025-.038 (MISCELLANEOUS) ×3 IMPLANT
GLIDEWIRE ADV .035X260CM (WIRE) ×3 IMPLANT
GUIDEWIRE ANGLED .035X260CM (WIRE) ×3 IMPLANT
HOVERMATT SINGLE USE (MISCELLANEOUS) ×3 IMPLANT
KIT MICROPUNCTURE NIT STIFF (SHEATH) ×3 IMPLANT
KIT PV (KITS) ×3 IMPLANT
PATCH THROMBIX TOPICAL PLAIN (HEMOSTASIS) ×3 IMPLANT
SHEATH GLIDE SLENDER 4/5FR (SHEATH) ×3 IMPLANT
SHEATH MICROPUNCTURE PEDAL 4FR (SHEATH) ×3 IMPLANT
SHEATH PINNACLE 5F 10CM (SHEATH) ×3 IMPLANT
SHEATH PINNACLE 6F 10CM (SHEATH) ×3 IMPLANT
SHEATH PINNACLE ST 6F 45CM (SHEATH) ×3 IMPLANT
SHEATH PROBE COVER 6X72 (BAG) ×6 IMPLANT
STOPCOCK MORSE 400PSI 3WAY (MISCELLANEOUS) ×3 IMPLANT
SYR MEDRAD MARK 7 150ML (SYRINGE) ×3 IMPLANT
TORQUE DEVICE .014-.018 (MISCELLANEOUS) ×3
TRANSDUCER W/STOPCOCK (MISCELLANEOUS) ×3 IMPLANT
TRAY PV CATH (CUSTOM PROCEDURE TRAY) ×3 IMPLANT
TUBING CIL FLEX 10 FLL-RA (TUBING) ×3 IMPLANT
WIRE APROACH 18G .014X300CM (WIRE) ×3 IMPLANT
WIRE APROACH 25G .014X300CM (WIRE) ×3 IMPLANT
WIRE BENTSON .035X145CM (WIRE) ×3 IMPLANT
WIRE RUNTHROUGH .014X180CM (WIRE) ×3 IMPLANT
WIRE RUNTHROUGH EXTENSION (WIRE) ×3 IMPLANT

## 2019-06-26 NOTE — Consult Note (Addendum)
Hospital Consult    Reason for Consult:  Left 5th toe osteomyelitis/PAD Requesting Physician:  Fletcher Anon  MRN #:  [id number]  History of Present Illness: This is a 58 y.o. female who presented to Dr. 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  She states this has been going on for a couple of weeks.  She has leukocytosis of 22k.   She underwent an angiogram today wit findings of long occlusion of the left SFA with moderate popliteal dz and one vessel runoff below the knee via TAA, which has calcified dz proximally.  There was no significant aortoiliac dz.  Vascular surgery is consulted.   She has hx of right non healing wounds with hx of transmetatarsal amputation by Dr. Sharol Given.    She has hx of NICM with EF of 45%, CHF, PAF on Eliquis and Amiodarone, hx of DM on insulin.   She underwent a left CEA by Dr. Trula Slade in April 2016 for asymptomatic left carotid artery stenosis.   Dr. Gwenlyn Found follows her carotid duplex studies.   She has hx of etoh use, marijuana, cocaine, oxycodone.   The pt is on a statin for cholesterol management.  The pt is not on a daily aspirin.   Other AC:  Plavix/Eliquis The pt is on CCB, ARB and BB for hypertension.   The pt is diabetic.  On insulin Tobacco hx:  Current (1ppd x 44 years)  Past Medical History:  Diagnosis Date   Alcohol abuse    Alcoholic cirrhosis ([name])    Anemia    Anxiety    Arthritis    "knees" (11/26/2018)   B12 deficiency    CAD (coronary artery disease)    a. cath 11/10/2014 LM nl, LAD min irregs, D1 30 ost, D2 50d, LCX 30m, OM1 80 p/m (1.5 mm vessel), OM2 17m, RCA nondom 44m-->med rx.. Demand ischemia in the setting of rapid a-fib.   Cardiomyopathy ([place])    Carotid artery disease ([place])    a. 01/2015 Carotid Angio: RICA [id number], LICA [id number]. s/p L carotid endarterectomy 02/2015.   Cellulitis    lower extremities   Chronic combined systolic and diastolic CHF (congestive heart failure) ([place])    a. Last echo  12/205: EF 40-45%, inferoapical/posterior HK, not technically sufficient to allow eval of LV diastolic function, normal LA in size..   Cocaine abuse ([name])    COPD (chronic obstructive pulmonary disease) ([name])    Critical lower limb ischemia    Depression    Diabetic peripheral neuropathy ([name])    Elevated troponin    a. Chronic elevation.   GERD (gastroesophageal reflux disease)    Hyperlipemia    Hypertension    Hypokalemia    Hypomagnesemia    Hypothyroidism    Marijuana abuse    Narcotic abuse ([place])    Noncompliance    NSVT (nonsustained ventricular tachycardia) ([name])    Obesity    PAF (paroxysmal atrial fibrillation) ([name])    Paroxysmal atrial tachycardia ([name])    Peripheral arterial disease ([place])    a. 01/2015 Angio/PTA: LSFA 100 w/ recon @ adductor canal and 1 vessel runoff via AT, RSFA 99 (atherectomy/pta) - 1 vessel runoff via diff dzs peroneal.   Pneumonia    "once or twice" (11/26/2018)   Poorly controlled type 2 diabetes mellitus ([name])    Renal insufficiency    a. 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   Sleep apnea    "couldn't handle wearing the mask" (11/26/2018)   Symptomatic bradycardia  avoid AV blocking agent per EP   Tobacco abuse     Past Surgical History:  Procedure Laterality Date   AMPUTATION Right 06/14/2017   Procedure: Right foot transmetatarsal amputation;  Surgeon: Newt Minion, MD;  Location: [facility];  Service: Orthopedics;  Laterality: Right;   AMPUTATION TOE Right 04/28/2017   Procedure: AMPUTATION OF RIGHT SECOND RAY;  Surgeon: Newt Minion, MD;  Location: [facility];  Service: Orthopedics;  Laterality: Right;   CARDIAC CATHETERIZATION     CARDIAC CATHETERIZATION N/A 01/12/2016   Procedure: Temporary Wire;  Surgeon: Minus Breeding, MD;  Location: [facility];  Service: Cardiovascular;  Laterality: N/A;   CARDIOVERSION  ~ 02/2013   "twice"    CAROTID ANGIOGRAM N/A 01/15/2015   Procedure: CAROTID ANGIOGRAM;  Surgeon: [name], MD;  Location: [facility] CATH LAB;  Service: Cardiovascular;  Laterality: N/A;   DILATION AND CURETTAGE OF UTERUS  1988   ENDARTERECTOMY Left 02/19/2015   Procedure: LEFT CAROTID ENDARTERECTOMY ;  Surgeon: Serafina Mitchell, MD;  Location: [place];  Service: Vascular;  Laterality: Left;   LEFT HEART CATHETERIZATION WITH CORONARY ANGIOGRAM N/A 10/31/2014   Procedure: LEFT HEART CATHETERIZATION WITH CORONARY ANGIOGRAM;  Surgeon: Burnell Blanks, MD;  Location: [facility] CATH LAB;  Service: Cardiovascular;  Laterality: N/A;   LOWER EXTREMITY ANGIOGRAM N/A 09/10/2013   Procedure: LOWER EXTREMITY ANGIOGRAM;  Surgeon: [name], MD;  Location: [facility] CATH LAB;  Service: Cardiovascular;  Laterality: N/A;   LOWER EXTREMITY ANGIOGRAM N/A 01/15/2015   Procedure: LOWER EXTREMITY ANGIOGRAM;  Surgeon: [name], MD;  Location: [facility] CATH LAB;  Service: Cardiovascular;  Laterality: N/A;   LOWER EXTREMITY ANGIOGRAPHY N/A 04/13/2017   Procedure: Lower Extremity Angiography;  Surgeon: [name], MD;  Location: [place];  Service: Cardiovascular;  Laterality: N/A;   PERIPHERAL VASCULAR INTERVENTION  04/13/2017   Procedure: Peripheral Vascular Intervention;  Surgeon: [name], MD;  Location: [place] [facility];  Service: Cardiovascular;;    Allergies  Allergen Reactions   Gabapentin Nausea And Vomiting and Other (See Comments)    POSSIBLE SHAKING? TAKING MED CURRENTLY   Lyrica [Pregabalin] Other (See Comments)    Shaking Pt still taking lyrica--"probably will stop taking"     Prior to Admission medications   Medication Sig Start Date End Date Taking? Authorizing Provider  acetaminophen (TYLENOL) 500 MG tablet Take 1,000 mg by mouth every 6 (six) hours as needed for mild pain.   Yes [provider]  albuterol (PROVENTIL HFA;VENTOLIN HFA) 108 (90 Base) MCG/ACT inhaler Inhale 2 puffs into the lungs every 6 (six) hours as needed for wheezing or shortness of breath.  08/09/17  Yes Medina-Vargas, Monina C, NP  amiodarone (PACERONE) 100 MG tablet TAKE 1 TABLET (100 MG TOTAL) BY MOUTH DAILY Patient taking differently: Take 100 mg by mouth daily.  09/20/18  Yes Larey Dresser, MD  amLODipine (NORVASC) 10 MG tablet TAKE 1 TABLET (10 MG TOTAL) BY MOUTH DAILY. 05/01/19  Yes Larey Dresser, MD  apixaban (ELIQUIS) 5 MG TABS tablet Take 1 tablet (5 mg total) by mouth 2 (two) times daily. 08/09/17  Yes Medina-Vargas, Monina C, NP  Aspirin-Caffeine (BC FAST PAIN RELIEF PO) Take 1 Package by mouth as needed (headache).   Yes [provider]  atorvastatin (LIPITOR) 40 MG tablet TAKE 1 TABLET BY MOUTH ONCE EVERY EVENING Patient taking differently: Take 40 mg by mouth daily. TAKE 1 TABLET BY MOUTH ONCE EVERY EVENING  08/09/17  Yes Medina-Vargas, Monina C, NP  bisoprolol (ZEBETA) 5 MG tablet Take 1 tablet (5 mg total) by mouth daily. 05/01/19  Yes Larey Dresser, MD  cholecalciferol (VITAMIN D) 1000 units tablet Take 1,000 Units by mouth daily.    Yes [provider]  clopidogrel (PLAVIX) 75 MG tablet Take 75 mg by mouth daily.   Yes [provider]  folic acid (FOLVITE) 1 MG tablet Take 1 tablet (1 mg total) by mouth daily. Patient taking differently: Take 1 mg by mouth daily.  10/25/18  Yes Kayleen Memos, DO  Insulin Glargine (LANTUS SOLOSTAR) 100 UNIT/ML Solostar Pen Inject 40 Units into the skin at bedtime.    Yes [provider]  Insulin Lispro (HUMALOG KWIKPEN) 200 UNIT/ML SOPN Inject 15 Units into the skin 3 (three) times daily with meals.    Yes [provider]  levothyroxine (SYNTHROID, LEVOTHROID) 50 MCG tablet Take 1 tablet (50 mcg total) by mouth daily. 08/09/17  Yes Medina-Vargas, Monina C, NP  losartan (COZAAR) 100 MG tablet TAKE 1 TABLET (100 MG TOTAL) BY MOUTH DAILY. 04/02/19  Yes Bensimhon, Shaune Pascal, MD  metolazone (ZAROXOLYN) 2.5 MG tablet Take 1 tablet (2.5 mg total) by mouth once a week. On Thursdays 01/30/19  Yes  Tillery, Satira Mccallum, PA-C  Multiple Vitamin (MULTIVITAMIN WITH MINERALS) TABS tablet Take 1 tablet by mouth daily. 10/25/18  Yes Irene Pap N, DO  potassium chloride SA (K-DUR) 20 MEQ tablet Take 2 tablets (40 mEq total) by mouth daily. 05/01/19  Yes Larey Dresser, MD  spironolactone (ALDACTONE) 25 MG tablet Take 1 tablet (25 mg total) by mouth daily. 05/01/19  Yes Larey Dresser, MD  thiamine 100 MG tablet Take 1 tablet (100 mg total) by mouth daily. 10/25/18  Yes Kayleen Memos, DO  torsemide (DEMADEX) 20 MG tablet Take 2 tablets (40 mg total) by mouth 2 (two) times daily. 01/30/19  Yes Shirley Friar, PA-C  ibuprofen (ADVIL) 600 MG tablet Take 1 tablet (600 mg total) by mouth every 6 (six) hours as needed. 03/25/19   Charlann Lange, PA-C    Social History   Socioeconomic History   Marital status: Divorced    Spouse name: Not on file   Number of children: 1   Years of education: 12   Highest education level: 12th grade  Occupational History   Occupation: disabled  Scientist, product/process development strain: Somewhat hard   Food insecurity    Worry: Never true    Inability: Never true   Transportation needs    Medical: No    Non-medical: No  Tobacco Use   Smoking status: Current Some Day Smoker    Packs/day: 1.00    Years: 44.00    Pack years: 44.00    Types: E-cigarettes, Cigarettes   Smokeless tobacco: Former Systems developer    Types: Snuff  Substance and Sexual Activity   Alcohol use: Yes    Comment: 11/26/2018 "couple drinks/month maybe"   Drug use: Yes    Types: "Crack" cocaine, Marijuana, Oxycodone    Comment: last used 3 days ago    Sexual activity: Not Currently  Lifestyle   Physical activity    Days per week: Not on file    Minutes per session: Not on file   Stress: Not on file  Relationships   Social connections    Talks on phone: Not on file    Gets together: Not on file    Attends religious service:  Not on file    Active member of  club or organization: Not on file    Attends meetings of clubs or organizations: Not on file    Relationship status: Not on file   Intimate partner violence    Fear of current or ex partner: Not on file    Emotionally abused: Not on file    Physically abused: Not on file    Forced sexual activity: Not on file  Other Topics Concern   Not on file  Social History Narrative   Lives in [place], in [place] with sister.  They are looking to move but don't have a place to go yet.       Family History  Problem Relation Age of Onset   Hypertension Mother    Diabetes Mother    Cancer Mother        breast, ovarian, colon   Clotting disorder Mother    Heart disease Mother    Heart attack Mother    Breast cancer Mother        in 82's   Hypertension Father    Heart disease Father    Emphysema Sister        smoked    ROS: [x]  Positive   [ ]  Negative   [ ]  All sytems reviewed and are negative  Cardiac: [x]  PAF [x]  CHF [x]  increased lipids [x]  hx left CEA [x]  high blood pressure    Vascular: []  pain in legs while walking []  pain in legs at rest []  pain in legs at night [x]  non-healing ulcers []  swelling in legs  Pulmonary: [x]  sleep apnea [x]  COPD   Neurologic: [x]  peripheral neuropathy   Hematologic: [x]  hx of anemia  Endocrine:   [x]  diabetes [x]  thyroid disease  GI [x]  hx alcoholic cirrhosis    GU: [x]  CKD []  renal failure []  HD--[]  M/W/F or []  T/T/S  Psychiatric: [x]  anxiety [x]  depression  Musculoskeletal: [x]  arthritis  Integumentary: []  rashes [x]  ulcers  Constitutional: []  fever []  chills   Physical Examination  Vitals:   06/26/19 1225 06/26/19 1230  BP: 135/64 126/61  Pulse: 78 77  Resp: 14 13  Temp:    SpO2: 99% 99%   Body mass index is 38.96 kg/m.  General:  WDWN in NAD Gait: Not observed HENT: WNL, normocephalic Pulmonary: normal non-labored breathing, without Rales, rhonchi,  wheezing Cardiac: regular Abdomen:  soft,  NT/ND, no masses Skin: without rashes Vascular Exam/Pulses: Distal pulses are not palpable Extremities: well healed TMA on the right; gangrenous left 5th toe Musculoskeletal: no muscle wasting or atrophy  Neurologic: A&O X 3;  No focal weakness or paresthesias are detected; speech is fluent/normal Psychiatric:  The pt has Normal affect.  CBC    Component Value Date/Time   WBC 22.3 (H) 06/26/2019 0314   RBC 3.11 (L) 06/26/2019 0314   HGB 9.4 (L) 06/26/2019 0314   HGB 9.7 (L) 06/20/2019 1125   HCT 28.1 (L) 06/26/2019 0314   HCT 30.3 (L) 06/20/2019 1125   PLT 310 06/26/2019 0314   PLT 342 06/20/2019 1125   MCV 90.4 06/26/2019 0314   MCV 93 06/20/2019 1125   MCH 30.2 06/26/2019 0314   MCHC 33.5 06/26/2019 0314   RDW 12.7 06/26/2019 0314   RDW 12.7 06/20/2019 1125   LYMPHSABS 1.7 06/21/2019 0811   LYMPHSABS 1.6 06/20/2019 1125   MONOABS 0.9 06/21/2019 0811   EOSABS 0.3 06/21/2019 0811   EOSABS 0.3 06/20/2019 1125   BASOSABS 0.0 06/21/2019 0811  BASOSABS 0.0 06/20/2019 1125    BMET    Component Value Date/Time   NA 133 (L) 06/26/2019 0314   NA 139 06/20/2019 1125   K 3.3 (L) 06/26/2019 0314   CL 91 (L) 06/26/2019 0314   CO2 28 06/26/2019 0314   GLUCOSE 287 (H) 06/26/2019 0314   BUN 66 (H) 06/26/2019 0314   BUN 90 (HH) 06/20/2019 1125   CREATININE 1.43 (H) 06/26/2019 0314   CREATININE 1.09 (H) 01/25/2016 0947   CALCIUM 8.8 (L) 06/26/2019 0314   GFRNONAA 41 (L) 06/26/2019 0314   GFRAA 47 (L) 06/26/2019 0314    COAGS: Lab Results  Component Value Date   INR 1.0 03/28/2018   INR 1.21 04/28/2017   INR 1.12 04/14/2017    Invasive Vascular Imaging:   Angiogram 06/26/2019 by Dr. Fletcher Anon: 1.  No significant aortoiliac disease. 2.  Long occlusion of the left SFA with moderate popliteal disease and one-vessel runoff below the knee via the anterior tibial artery which has calcified disease proximally. 3.  Unsuccessful endovascular intervention on the left SFA due to  inability to cross the occlusion in spite of antegrade and retrograde access.  Recommendations: We will refer the patient to vascular surgery to see if she is a candidate for femoropopliteal bypass. Anticoagulation can be resumed tomorrow if no bleeding issues from the right groin.   ASSESSMENT/PLAN: This is a 58 y.o. female with hx of PAD and non healing left 5th toe ulcer  -will proceed with left femoral to distal bypass with left 5th toe (possible 4th toe) amputation and will plan to proceed on Friday.   Dr. Donnetta Hutching discussed with pt and she understands and agrees to proceeds -continue to hold oral AC-may start heparin if needs AC.  -BUN/Cr of 66/1.43 this morning.     Leontine Locket, PA-C [facility] [phone]  I have examined the patient, reviewed and agree with above.  Patient presents with gangrene of the left fifth toe and possible fourth toe.  Known peripheral vascular occlusive disease.  Underwent attempted endovascular treatment today which was unsuccessful.  The patient does have a flush occlusion of the superficial femoral artery at its origin.  Reconstitutes the diseased popliteal artery.  She has single-vessel runoff via the anterior tibial artery with severe disease at the origin of the anterior tibial artery and proximal 1 to 2 cm.  Discussed options with the patient.  Recommend left infrainguinal bypass for limb salvage.  We will also require toe amputation.  If she has adequate vein, would bypass to the anterior tibial artery.  If she does not have adequate vein could go to the popliteal artery although she does have severe disease at the origin of the anterior tibial.  Will hold her anticoagulation.  Plan surgery on Friday, 06/28/2019  Curt Jews, MD 06/26/2019 3:20 PM

## 2019-06-26 NOTE — Progress Notes (Signed)
ANTIBIOTIC CONSULT NOTE - Follow Up Consult  Pharmacy Consult for Vancomycin Indication:  osteomyelitis  Allergies  Allergen Reactions  . Gabapentin Nausea And Vomiting and Other (See Comments)    POSSIBLE SHAKING? TAKING MED CURRENTLY  . Lyrica [Pregabalin] Other (See Comments)    Shaking Pt still taking lyrica--"probably will stop taking"     Patient Measurements: Height: 5\' 4"  (162.6 cm) Weight: 227 lb (103 kg) IBW/kg (Calculated) : 54.7 Heparin Dosing Weight: 80 kg  Vital Signs: Temp: 99.7 F (37.6 C) (08/12 0415) Temp Source: Oral (08/12 0415) BP: 126/61 (08/12 1230) Pulse Rate: 77 (08/12 1230)  Labs: Recent Labs    06/24/19 0435  06/25/19 0318 06/25/19 1042 06/25/19 1926 06/26/19 0314  HGB 9.5*  --  9.7*  --   --  9.4*  HCT 28.8*  --  29.4*  --   --  28.1*  PLT 280  --  331  --   --  310  APTT  --    < > 58* 60* 60* 115*  HEPARINUNFRC  --    < > >2.20* >2.20*  --  >2.20*  CREATININE 1.85*  --  1.74*  --   --  1.43*   < > = values in this interval not displayed.    Estimated Creatinine Clearance: 51.3 mL/min (A) (by C-G formula based on SCr of 1.43 mg/dL (H)).  Assessment:  58 yr old female day # 5 vancomycin and ceftriaxone for left fifth toe osteomyelitis. Pharmacy dosing vancomycin.  -WBC= 22.3, afeb, SCr= 1.43 (trend down), CrCl ~ 50     Antimicrobials this admission:  Vanc 8/7 >>  CTX  8/7 >>  Dose Adj:  8/8 Vanc>>  Microbiology results: 8/7 BCx: neg 8/7 Surg PCR: pos/pos 8/7 COVID: negative  Goal of Therapy:  AUC for Vancomycin: 400-550   Plan:  -Change Vancomycin to 1000mg  q24h (AUC:541, SCr 1.43) -Will follow renal function, cultures and clinical progress  Hildred Laser, PharmD Clinical Pharmacist **Pharmacist phone directory can now be found on [email or web address] ([email or web address]).  Listed under [place].

## 2019-06-26 NOTE — Progress Notes (Signed)
PT Cancellation Note  Patient Details Name: [name] MRN: [id number] DOB: 1961-01-26   Cancelled Treatment:    Reason Eval/Treat Not Completed: (P) Patient at procedure or test/unavailable(patient at procedure will f/u if time permits.)   Terre Hanneman Eli Hose 06/26/2019, 6:02 PM

## 2019-06-26 NOTE — Plan of Care (Signed)
  Problem: Skin Integrity: Goal: Risk for impaired skin integrity will decrease Outcome: Progressing   Problem: Pain Managment: Goal: General experience of comfort will improve Outcome: Progressing   Problem: Safety: Goal: Ability to remain free from injury will improve Outcome: Progressing   

## 2019-06-26 NOTE — Progress Notes (Signed)
Progress Note   Patient Demographics:    [name], is a 58 y.o. female  MRN: [id number]   DOB - 1960-12-29  Admit Date - 06/21/2019  Outpatient Primary MD for the patient is Tsosie Billing, MD (Inactive)  Outpatient Specialists: Patient been followed by Dr. Alvester Chou in the past regarding PVD of bilateral lower extremity with multiple angiograms and stent placed, she did see vascular Dr. Trula Slade for carotid stenosis, she did see Dr. Sharol Given in the past for right transmetatarsal amputation.  Patient coming from: home   HPI:    [name]  is a 58 y.o. female, with past medical history of diabetes mellitus, tobacco abuse, still smoking, CAD, peripheral vascular disease s/p angiogram and stent by Dr. Gwenlyn Found, A. Fib on Eliquis, COPD, cirrhosis, OSA, CKD stage III, hypertension. She presents to ED secondary to nonhealing left fifth toe wounds, as well sent by her PCP for hyperkalemia, apparently patient had potassium of 5.9 yesterday at PCP office, she was asked to come to ED, in ED potassium was 5, she is on Aldactone, losartan and potassium supplement, as well she was noted to have left fifth toe nonhealing ulcer, with foul-smelling odor, report is been for last 3 weeks, she has not been given any antibiotics, denies fever, chills, shortness of breath. X-ray significant for osteomyelitis of left fifth toe, potassium of 5, and reddening of 2, white blood cell of 12,000, hospitalist called to admit.    Subjective    Patient continues to complain of right upper extremity pain although moderately improved, somewhat anxious for surgery at this morning.  Otherwise no acute issues or events overnight.  Patient has been avoiding weightbearing on her left foot which has remarkably improved her pain. Otherwise patient denies chest pain, shortness of breath, nausea, vomiting, diarrhea, constipation, headache, fever, chills.   Assessment & Plan:  Active Problems:   DM (diabetes mellitus), type 2 with peripheral vascular complications ([name])   Carotid arterial disease ([name])   Coronary artery disease   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   Chronic combined systolic and diastolic CHF (congestive heart failure) ([name])   COPD GOLD 0    CKD (chronic kidney disease), stage III ([name])   CHF (congestive heart failure) ([name])   Osteomyelitis ([name])   Left fifth toe with infected diabetic ulcer and osteomyelitis -X-ray significant for osteomyelitis -Blood cultures remain negative to date -Continue vancomycin and Rocephin. (MRSA nares positive) -Leukocytosis uptrending, continue to follow -Orthopedic consult greatly appreciated, will need PAD to be addressed first - as below  PAD/PVD -Known history of peripheral vascular disease; s/p R transmetatarsal amputation -Previous eval/treatment with vascular (Dr. Alvester Chou) in the past -abnormal Doppler per cardiology, pending lower extremity arteriogram this morning -Hold eliquis pending further work-up and evaluation- started on heparin gtt for coverage -Continue with statin, plavix for now  Chronic combined diastolic and systolic CHF, not in acute exacerbation -Echo 10/2018 with EF 45-50% (improved from40-45% 11/2016). -Appears to be euvolemic, continue with home dose torsemide -We will hold losartan, and Aldactone given hyperkalemia; albeit mild  Hyperkalemia,mild, resolved -  Hold Aldactone, losartan and potassium supplement -Potassium currently quite stable, pressure well controlled off these medications -potentially could consider discontinuing them at discharge pending vitals and potassium level over the next few days  Diabetes mellitus, insulin dependent, moderately well controlled -Discontinue pre-meal insulin, increase long-acting to 20 units twice daily -Continue sliding scale insulin, hypoglycemic protocol.  CKD 3, stable - better than baseline -Baseline appears to be around  2.2 per previous records - currently 1.7-1.8  Paroxysmal atrial fibrillation: -With normal sinus rhythm, continue with amiodarone and hold Eliquis as above -Unclear if patient needs chronic anticoagulation if paroxysmal afib and remains in sinus rhythm throughout stay - defer to outpatient cardiology for further eval/treatment.  Essential hypertension, well controlled - Continue with home medication, losartan on hold given hyperkalemia  Hyperlipidemia -Continue atorvastatin  Hypothyroidism: -Continue with levothyroxine  Tobacco abuse:  - Was counseled at length given above vascular disease, started on nicotine patch  DVT Prophylaxis heparin gtt Code Status Full code Dispo: Likely DC to  Home pending further eval and workup as above Condition GUARDED   Consults called: Orthopedic sx, cardiology/vascular Admission status: inpatient, continues to require IV antibiotics, surgical evaluation and possible surgical procedure/intervention during this hospitalization Time spent in minutes : >35 minutes   Home Medications:   Prior to Admission medications   Medication Sig Start Date End Date Taking? Authorizing Provider  acetaminophen (TYLENOL) 500 MG tablet Take 1,000 mg by mouth every 6 (six) hours as needed for mild pain.   Yes [provider]  albuterol (PROVENTIL HFA;VENTOLIN HFA) 108 (90 Base) MCG/ACT inhaler Inhale 2 puffs into the lungs every 6 (six) hours as needed for wheezing or shortness of breath. 08/09/17  Yes Medina-Vargas, Monina C, NP  amiodarone (PACERONE) 100 MG tablet TAKE 1 TABLET (100 MG TOTAL) BY MOUTH DAILY Patient taking differently: Take 100 mg by mouth daily.  09/20/18  Yes Larey Dresser, MD  amLODipine (NORVASC) 10 MG tablet TAKE 1 TABLET (10 MG TOTAL) BY MOUTH DAILY. 05/01/19  Yes Larey Dresser, MD  apixaban (ELIQUIS) 5 MG TABS tablet Take 1 tablet (5 mg total) by mouth 2 (two) times daily. 08/09/17  Yes Medina-Vargas, Monina C, NP   Aspirin-Caffeine (BC FAST PAIN RELIEF PO) Take 1 Package by mouth as needed (headache).   Yes [provider]  atorvastatin (LIPITOR) 40 MG tablet TAKE 1 TABLET BY MOUTH ONCE EVERY EVENING Patient taking differently: Take 40 mg by mouth daily. TAKE 1 TABLET BY MOUTH ONCE EVERY EVENING 08/09/17  Yes Medina-Vargas, Monina C, NP  bisoprolol (ZEBETA) 5 MG tablet Take 1 tablet (5 mg total) by mouth daily. 05/01/19  Yes Larey Dresser, MD  cholecalciferol (VITAMIN D) 1000 units tablet Take 1,000 Units by mouth daily.    Yes [provider]  clopidogrel (PLAVIX) 75 MG tablet Take 75 mg by mouth daily.   Yes [provider]  folic acid (FOLVITE) 1 MG tablet Take 1 tablet (1 mg total) by mouth daily. Patient taking differently: Take 1 mg by mouth daily.  10/25/18  Yes Kayleen Memos, DO  Insulin Glargine (LANTUS SOLOSTAR) 100 UNIT/ML Solostar Pen Inject 40 Units into the skin at bedtime.    Yes [provider]  Insulin Lispro (HUMALOG KWIKPEN) 200 UNIT/ML SOPN Inject 15 Units into the skin 3 (three) times daily with meals.    Yes [provider]  levothyroxine (SYNTHROID, LEVOTHROID) 50 MCG tablet Take 1 tablet (50 mcg total) by mouth daily. 08/09/17  Yes Medina-Vargas,  Monina C, NP  losartan (COZAAR) 100 MG tablet TAKE 1 TABLET (100 MG TOTAL) BY MOUTH DAILY. 04/02/19  Yes Bensimhon, Shaune Pascal, MD  metolazone (ZAROXOLYN) 2.5 MG tablet Take 1 tablet (2.5 mg total) by mouth once a week. On Thursdays 01/30/19  Yes Tillery, Satira Mccallum, PA-C  Multiple Vitamin (MULTIVITAMIN WITH MINERALS) TABS tablet Take 1 tablet by mouth daily. 10/25/18  Yes Irene Pap N, DO  potassium chloride SA (K-DUR) 20 MEQ tablet Take 2 tablets (40 mEq total) by mouth daily. 05/01/19  Yes Larey Dresser, MD  spironolactone (ALDACTONE) 25 MG tablet Take 1 tablet (25 mg total) by mouth daily. 05/01/19  Yes Larey Dresser, MD  thiamine 100 MG tablet Take 1 tablet (100 mg total) by mouth daily.  10/25/18  Yes Kayleen Memos, DO  torsemide (DEMADEX) 20 MG tablet Take 2 tablets (40 mg total) by mouth 2 (two) times daily. 01/30/19  Yes Shirley Friar, PA-C  ibuprofen (ADVIL) 600 MG tablet Take 1 tablet (600 mg total) by mouth every 6 (six) hours as needed. 03/25/19   Charlann Lange, PA-C     Allergies:     Allergies  Allergen Reactions  . Gabapentin Nausea And Vomiting and Other (See Comments)    POSSIBLE SHAKING? TAKING MED CURRENTLY  . Lyrica [Pregabalin] Other (See Comments)    Shaking Pt still taking lyrica--"probably will stop taking"      Physical Exam:   Vitals  Blood pressure 136/63, pulse 83, temperature 99.7 F (37.6 C), temperature source Oral, resp. rate 16, height 5\' 4"  (1.626 m), weight 103 kg, last menstrual period 11/27/2012, SpO2 97 %.   General well-developed female, laying in bed in no apparent distress Normal affect and insight, Not Suicidal or Homicidal, Awake Alert, Oriented X 3. No F.N deficits, ALL C.Nerves Intact, Strength 5/5 all 4 extremities, Sensation intact all 4 extremities, Plantars down going. Ears and Eyes appear Normal, Conjunctivae clear, PERRLA. Moist Oral Mucosa. Supple Neck, No JVD, No cervical lymphadenopathy appriciated, No Carotid Bruits. Symmetrical Chest wall movement, Good air movement bilaterally, CTAB. RRR, No Gallops, Rubs or Murmurs, No Parasternal Heave.  +1 edema Positive Bowel Sounds, Abdomen Soft, No tenderness, No organomegaly appriciated,No rebound guarding or rigidity. 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 No Palpable Lymph Nodes in Neck or Axillae      Data Review:    CBC Recent Labs  Lab 06/20/19 1125  06/21/19 0811 06/22/19 0302 06/23/19 0651 06/24/19 0435 06/25/19 0318 06/26/19 0314  WBC 12.4*   < > 12.0* 9.4 11.3* 12.7* 14.7* 22.3*  HGB 9.7*   < > 9.4* 9.7* 9.7* 9.5*  9.7* 9.4*  HCT 30.3*   < > 31.4* 31.0* 30.6* 28.8* 29.4* 28.1*  PLT 342   < > 316 297 319 280 331 310  MCV 93   < > 100.0 96.0 93.0 91.7 91.6 90.4  MCH 29.8   < > 29.9 30.0 29.5 30.3 30.2 30.2  MCHC 32.0   < > 29.9* 31.3 31.7 33.0 33.0 33.5  RDW 12.7   < > 13.4 13.2 13.0 12.8 13.0 12.7  LYMPHSABS 1.6  --  1.7  --   --   --   --   --   MONOABS  --   --  0.9  --   --   --   --   --   EOSABS 0.3  --  0.3  --   --   --   --   --   BASOSABS 0.0  --  0.0  --   --   --   --   --    < > = values in this interval not displayed.   ------------------------------------------------------------------------------------------------------------------  Chemistries  Recent Labs  Lab 06/22/19 0302 06/23/19 0651 06/24/19 0435 06/25/19 0318 06/26/19 0314  NA 138 134* 136 134* 133*  K 4.5 4.0 3.3* 3.4* 3.3*  CL 102 96* 96* 92* 91*  CO2 22 22 27 27 28   GLUCOSE 180* 187* 200* 184* 287*  BUN 88* 87* 90* 90* 66*  CREATININE 1.82* 1.78* 1.85* 1.74* 1.43*  CALCIUM 9.0 8.6* 8.4* 8.7* 8.8*   ------------------------------------------------------------------------------------------------------------------ estimated creatinine clearance is 51.3 mL/min (A) (by C-G formula based on SCr of 1.43 mg/dL (H)). ---------------------------------------------------------------------------------------------------------------  Urinalysis    Component Value Date/Time   COLORURINE YELLOW 03/18/2019 1540   APPEARANCEUR CLEAR 03/18/2019 1540   LABSPEC 1.013 03/18/2019 1540   PHURINE 5.0 03/18/2019 1540   GLUCOSEU NEGATIVE 03/18/2019 1540   HGBUR NEGATIVE 03/18/2019 1540   BILIRUBINUR NEGATIVE 03/18/2019 1540   KETONESUR NEGATIVE 03/18/2019 1540   PROTEINUR NEGATIVE 03/18/2019 1540   UROBILINOGEN 0.2 09/05/2015 0012   NITRITE NEGATIVE 03/18/2019 1540   LEUKOCYTESUR NEGATIVE 03/18/2019 1540    ----------------------------------------------------------------------------------------------------------------    Imaging Results:    No results found.  [name] DO on 06/26/2019 at 8:23 AM  Between 7am to 7pm - Pager - [phone]. After 7pm go to [email or web address] - password [facility]  [facility] - Office  [phone]

## 2019-06-26 NOTE — Progress Notes (Signed)
CBG 454, Patient ate late dinner at 8 p.m. R.N. aware and Text page K. Schorr N.P. results

## 2019-06-26 NOTE — Progress Notes (Signed)
Lt wrist

## 2019-06-26 NOTE — Interval H&P Note (Signed)
History and Physical Interval Note:  06/26/2019 8:51 AM  [name]  has presented today for surgery, with the diagnosis of Critical Limb Ischemia.  The various methods of treatment have been discussed with the patient and family. After consideration of risks, benefits and other options for treatment, the patient has consented to  Procedure(s): LOWER EXTREMITY ANGIOGRAPHY (Left) as a surgical intervention.  The patient's history has been reviewed, patient examined, no change in status, stable for surgery.  I have reviewed the patient's chart and labs.  Questions were answered to the patient's satisfaction.     Kathlyn Sacramento

## 2019-06-27 ENCOUNTER — Encounter (HOSPITAL_COMMUNITY): Payer: Self-pay | Admitting: General Practice

## 2019-06-27 DIAGNOSIS — I2510 Atherosclerotic heart disease of native coronary artery without angina pectoris: Secondary | ICD-10-CM

## 2019-06-27 LAB — CBC
HCT: 24.3 % — ABNORMAL LOW (ref 36.0–46.0)
Hemoglobin: 7.8 g/dL — ABNORMAL LOW (ref 12.0–15.0)
MCH: 29.9 pg (ref 26.0–34.0)
MCHC: 32.1 g/dL (ref 30.0–36.0)
MCV: 93.1 fL (ref 80.0–100.0)
Platelets: 300 10*3/uL (ref 150–400)
RBC: 2.61 MIL/uL — ABNORMAL LOW (ref 3.87–5.11)
RDW: 13.2 % (ref 11.5–15.5)
WBC: 22 10*3/uL — ABNORMAL HIGH (ref 4.0–10.5)
nRBC: 0 % (ref 0.0–0.2)

## 2019-06-27 LAB — GLUCOSE, CAPILLARY
Glucose-Capillary: 274 mg/dL — ABNORMAL HIGH (ref 70–99)
Glucose-Capillary: 233 mg/dL — ABNORMAL HIGH (ref 70–99)
Glucose-Capillary: 301 mg/dL — ABNORMAL HIGH (ref 70–99)
Glucose-Capillary: 280 mg/dL — ABNORMAL HIGH (ref 70–99)

## 2019-06-27 LAB — BASIC METABOLIC PANEL
Anion gap: 15 (ref 5–15)
BUN: 71 mg/dL — ABNORMAL HIGH (ref 6–20)
CO2: 27 mmol/L (ref 22–32)
Calcium: 8.7 mg/dL — ABNORMAL LOW (ref 8.9–10.3)
Chloride: 92 mmol/L — ABNORMAL LOW (ref 98–111)
Creatinine, Ser: 1.9 mg/dL — ABNORMAL HIGH (ref 0.44–1.00)
GFR calc Af Amer: 34 mL/min — ABNORMAL LOW (ref >60)
GFR calc non Af Amer: 29 mL/min — ABNORMAL LOW (ref >60)
Glucose, Bld: 254 mg/dL — ABNORMAL HIGH (ref 70–99)
Potassium: 4.6 mmol/L (ref 3.5–5.1)
Sodium: 134 mmol/L — ABNORMAL LOW (ref 135–145)

## 2019-06-27 LAB — CREATININE, SERUM
Creatinine, Ser: 2.16 mg/dL — ABNORMAL HIGH (ref 0.44–1.00)
GFR calc Af Amer: 29 mL/min — ABNORMAL LOW (ref >60)
GFR calc non Af Amer: 25 mL/min — ABNORMAL LOW (ref >60)

## 2019-06-27 LAB — VANCOMYCIN, RANDOM: Vancomycin Rm: 18

## 2019-06-27 LAB — APTT: aPTT: 25 seconds (ref 24–36)

## 2019-06-27 MED ORDER — VANCOMYCIN VARIABLE DOSE PER UNSTABLE RENAL FUNCTION (PHARMACIST DOSING): Status: DC

## 2019-06-27 MED ORDER — VANCOMYCIN HCL IN DEXTROSE 1-5 GM/200ML-% IV SOLN
1000.0000 mg | Freq: Once | INTRAVENOUS | Status: AC
  Administered 2019-06-27: 1000 mg via INTRAVENOUS
  Filled 2019-06-27: qty 200

## 2019-06-27 MED ORDER — SODIUM CHLORIDE 0.9 % IV SOLN
1.5000 g | INTRAVENOUS | Status: AC
  Administered 2019-06-28: 1.5 g via INTRAVENOUS
  Filled 2019-06-27: qty 1.5

## 2019-06-27 NOTE — Progress Notes (Signed)
ANTIBIOTIC CONSULT NOTE - Follow Up Consult  Pharmacy Consult for Vancomycin Indication:  osteomyelitis  Allergies  Allergen Reactions  . Gabapentin Nausea And Vomiting and Other (See Comments)    POSSIBLE SHAKING? TAKING MED CURRENTLY  . Lyrica [Pregabalin] Other (See Comments)    Shaking Pt still taking lyrica--"probably will stop taking"     Patient Measurements: Height: 5\' 4"  (162.6 cm) Weight: 226 lb 13.7 oz (102.9 kg) IBW/kg (Calculated) : 54.7 Heparin Dosing Weight: 80 kg  Vital Signs: Temp: 98.3 F (36.8 C) (08/13 0355) Temp Source: Oral (08/13 0355) BP: 129/56 (08/13 0355) Pulse Rate: 89 (08/13 0355)  Labs: Recent Labs    06/25/19 0318 06/25/19 1042 06/25/19 1926 06/26/19 0314 06/27/19 0341  HGB 9.7*  --   --  9.4* 7.8*  HCT 29.4*  --   --  28.1* 24.3*  PLT 331  --   --  310 300  APTT 58* 60* 60* 115* 25  HEPARINUNFRC >2.20* >2.20*  --  >2.20*  --   CREATININE 1.74*  --   --  1.43* 1.90*    Estimated Creatinine Clearance: 38.6 mL/min (A) (by C-G formula based on SCr of 1.9 mg/dL (H)).  Assessment:  58 yr old female day # 6 vancomycin and ceftriaxone for left fifth toe osteomyelitis. Pharmacy dosing vancomycin. Plans noted for femoropopliteal bypass and toe amputation on 8/14. -WBC= 22.3, afeb, SCr= 1.43>>1.9, CrCl ~ 40     Antimicrobials this admission:  Vanc 8/7 >>  CTX  8/7 >>   Microbiology results: 8/7 BCx: neg 8/7 Surg PCR: pos/pos 8/7 COVID: negative  Goal of Therapy:  AUC for Vancomycin: 400-550   Plan:  -Check a vancomycin random level later today -Will follow renal function, cultures and clinical progress  Hildred Laser, PharmD Clinical Pharmacist **Pharmacist phone directory can now be found on [email or web address] ([email or web address]).  Listed under [place].

## 2019-06-27 NOTE — Progress Notes (Addendum)
[facility]  Subjective  - Tender left thigh.   Objective (!) 129/56 89 98.3 F (36.8 C) (Oral) 13 99%  Intake/Output Summary (Last 24 hours) at 06/27/2019 0710 Last data filed at 06/27/2019 0500 Gross per 24 hour  Intake 600 ml  Output 850 ml  Net -250 ml   Lungs non labored  Left 5th toe gangrene  Heart hx of A fib RRR currently   Assessment/Planning: PAD with left 5th toe non healing ulcer.  We will plan left fem to distal tibial bypass and 5th possible 4th toe amputation with Dr. Curt Jews.  Patient is in agreement. Eliquis is being held, she will remain on her Plavix. NPO passed MN  [facility] 06/27/2019 7:10 AM --  Laboratory Lab Results: Recent Labs    06/26/19 0314 06/27/19 0341  WBC 22.3* 22.0*  HGB 9.4* 7.8*  HCT 28.1* 24.3*  PLT 310 300   BMET Recent Labs    06/26/19 0314 06/27/19 0341  NA 133* 134*  K 3.3* 4.6  CL 91* 92*  CO2 28 27  GLUCOSE 287* 254*  BUN 66* 71*  CREATININE 1.43* 1.90*  CALCIUM 8.8* 8.7*    COAG Lab Results  Component Value Date   INR 1.0 03/28/2018   INR 1.21 04/28/2017   INR 1.12 04/14/2017   No results found for: PTT  I have examined the patient, reviewed and agree with above.  Again discussed surgical plan with patient.  Will have left femoral to anterior tibial bypass if adequate vein length.  If not plan left femoral to popliteal bypass.  Does have significant stenosis in the origin of the anterior tibial which is her single runoff vessel.  We will also have fifth toe amputation and possible fourth toe amputation if this is involved as well.  Curt Jews, MD 06/27/2019 8:09 AM

## 2019-06-27 NOTE — Progress Notes (Signed)
   Dr. Fletcher Anon note reviewed.  Dr. Donnetta Hutching note reviewed. Awaiting surgery. We will sign off.  Please let us know if we can be of further assistance.  Candee Furbish, MD

## 2019-06-27 NOTE — Progress Notes (Signed)
Physical Therapy Treatment Patient Details Name: [name] MRN: [id number] DOB: [birth year] Today's Date: 06/27/2019    History of Present Illness Pt is a 58 y/o female admitted secondary to L 5th toe osteomyelitis awaiting amputation. PMHx: substance abuse, cirrhosis, PAF, HTN, PAD, neuropathy, CHF, DM, NICM    PT Comments    Pt in bed on arrival reporting RUE pain due to IV infiltration with noted edema. Pt with ring stuck on finger and were able to remove with lotion with roommate/cousin present in room with pt requesting family take ring home. Pt reports left thigh pain and pain in both locations limiting mobility. Pt able to walk in room and perform HEP but unable to tolerate increased gait. Encouraged ambulation with nursing and continuing HEP. Will continue to follow acutely.     Follow Up Recommendations  Home health PT     Equipment Recommendations  3in1 (PT)    Recommendations for Other Services       Precautions / Restrictions Precautions Precautions: Fall Restrictions LLE Weight Bearing: Weight bearing as tolerated Other Position/Activity Restrictions: post op shoe    Mobility  Bed Mobility Overal bed mobility: Modified Independent             General bed mobility comments: use of bed rails, increased time, HOB 30 degrees  Transfers Overall transfer level: Needs assistance Equipment used: None Transfers: Sit to/from Stand Sit to Stand: Min guard         General transfer comment: guarding for stability with max assist to don post op shoe EOB  Ambulation/Gait Ambulation/Gait assistance: Min guard Gait Distance (Feet): 40 Feet Assistive device: Rolling walker (2 wheeled) Gait Pattern/deviations: Step-through pattern;Decreased stride length;Trunk flexed   Gait velocity interpretation: 1.31 - 2.62 ft/sec, indicative of limited community ambulator General Gait Details: cues for proximity to rW and posture, gait limited by pain and fatigue with  reliance on RW   Stairs             Wheelchair Mobility    Modified Rankin (Stroke Patients Only)       Balance Overall balance assessment: Needs assistance Sitting-balance support: Feet supported Sitting balance-Leahy Scale: Good Sitting balance - Comments: EOB with good stability   Standing balance support: Bilateral upper extremity supported Standing balance-Leahy Scale: Poor                              Cognition Arousal/Alertness: Awake/alert Behavior During Therapy: WFL for tasks assessed/performed Overall Cognitive Status: Within Functional Limits for tasks assessed                                        Exercises General Exercises - Lower Extremity Long Arc Quad: AROM;Both;Seated;20 reps Hip ABduction/ADduction: AROM;20 reps;Both;Seated Hip Flexion/Marching: AROM;20 reps;Both;Seated    General Comments        Pertinent Vitals/Pain Pain Score: 6  Pain Location: right hand with edema Pain Descriptors / Indicators: Aching;Constant Pain Intervention(s): Limited activity within patient's tolerance;Monitored during session;Ice applied;Repositioned    Home Living Family/patient expects to be discharged to:: Private residence Living Arrangements: Non-relatives/Friends                  Prior Function            PT Goals (current goals can now be found in the care plan section) Progress towards PT  goals: Progressing toward goals    Frequency    Min 3X/week      PT Plan Discharge plan needs to be updated    Co-evaluation              AM-PAC PT "6 Clicks" Mobility   Outcome Measure  Help needed turning from your back to your side while in a flat bed without using bedrails?: A Little Help needed moving from lying on your back to sitting on the side of a flat bed without using bedrails?: A Little Help needed moving to and from a bed to a chair (including a wheelchair)?: A Little Help needed standing up  from a chair using your arms (e.g., wheelchair or bedside chair)?: None Help needed to walk in hospital room?: A Little Help needed climbing 3-5 steps with a railing? : A Little 6 Click Score: 19    End of Session   Activity Tolerance: Patient tolerated treatment well Patient left: in chair;with call bell/phone within reach;with family/visitor present Nurse Communication: Mobility status PT Visit Diagnosis: Other abnormalities of gait and mobility (R26.89);Pain;Muscle weakness (generalized) (M62.81)     Time: [id number] PT Time Calculation (min) (ACUTE ONLY): 23 min  Charges:  $Gait Training: 8-22 mins $Therapeutic Exercise: 8-22 mins                     Kurk Corniel Pam Drown, PT Acute Rehabilitation Services Pager: [phone] Office: [place] 06/27/2019, 1:39 PM

## 2019-06-27 NOTE — H&P (View-Only) (Signed)
[facility]  Subjective  - Tender left thigh.   Objective (!) 129/56 89 98.3 F (36.8 C) (Oral) 13 99%  Intake/Output Summary (Last 24 hours) at 06/27/2019 0710 Last data filed at 06/27/2019 0500 Gross per 24 hour  Intake 600 ml  Output 850 ml  Net -250 ml   Lungs non labored  Left 5th toe gangrene  Heart hx of A fib RRR currently   Assessment/Planning: PAD with left 5th toe non healing ulcer.  We will plan left fem to distal tibial bypass and 5th possible 4th toe amputation with Dr. Curt Jews.  Patient is in agreement. Eliquis is being held, she will remain on her Plavix. NPO passed MN  [facility] 06/27/2019 7:10 AM --  Laboratory Lab Results: Recent Labs    06/26/19 0314 06/27/19 0341  WBC 22.3* 22.0*  HGB 9.4* 7.8*  HCT 28.1* 24.3*  PLT 310 300   BMET Recent Labs    06/26/19 0314 06/27/19 0341  NA 133* 134*  K 3.3* 4.6  CL 91* 92*  CO2 28 27  GLUCOSE 287* 254*  BUN 66* 71*  CREATININE 1.43* 1.90*  CALCIUM 8.8* 8.7*    COAG Lab Results  Component Value Date   INR 1.0 03/28/2018   INR 1.21 04/28/2017   INR 1.12 04/14/2017   No results found for: PTT  I have examined the patient, reviewed and agree with above.  Again discussed surgical plan with patient.  Will have left femoral to anterior tibial bypass if adequate vein length.  If not plan left femoral to popliteal bypass.  Does have significant stenosis in the origin of the anterior tibial which is her single runoff vessel.  We will also have fifth toe amputation and possible fourth toe amputation if this is involved as well.  Curt Jews, MD 06/27/2019 8:09 AM

## 2019-06-27 NOTE — Progress Notes (Addendum)
ANTIBIOTIC CONSULT NOTE - Follow Up Consult  Pharmacy Consult for Vancomycin Indication:  osteomyelitis  Allergies  Allergen Reactions  . Gabapentin Nausea And Vomiting and Other (See Comments)    POSSIBLE SHAKING? TAKING MED CURRENTLY  . Lyrica [Pregabalin] Other (See Comments)    Shaking Pt still taking lyrica--"probably will stop taking"     Patient Measurements: Height: 5\' 4"  (162.6 cm) Weight: 226 lb 13.7 oz (102.9 kg) IBW/kg (Calculated) : 54.7  Vital Signs: Temp: 98.2 F (36.8 C) (08/13 1959) Temp Source: Oral (08/13 1959) BP: 122/50 (08/13 1959) Pulse Rate: 71 (08/13 1959)  Labs: Recent Labs    06/25/19 0318 06/25/19 1042 06/25/19 1926 06/26/19 0314 06/27/19 0341 06/27/19 1853  HGB 9.7*  --   --  9.4* 7.8*  --   HCT 29.4*  --   --  28.1* 24.3*  --   PLT 331  --   --  310 300  --   APTT 58* 60* 60* 115* 25  --   HEPARINUNFRC >2.20* >2.20*  --  >2.20*  --   --   CREATININE 1.74*  --   --  1.43* 1.90* 2.16*    Estimated Creatinine Clearance: 34 mL/min (A) (by C-G formula based on SCr of 2.16 mg/dL (H)).   Assessment:  58 yr old female day # 6 vancomycin and ceftriaxone for left fifth toe osteomyelitis. Pharmacy dosing vancomycin (currently using spot dosing, due to unstable renal function). Plans noted for femoropopliteal bypass and toe amputation on 8/14.  Most recent labs: WBC 22.3, afebrile, Scr 1.43>2.16, CrCl 34 mL/min (renal function unstable and worsening)   Vancomycin level drawn at 17:48 today (~24 hrs after last dose of vancomycin [1000 mg]) was 18 mg/L, which is within the goal trough range for vancomycin.   Antimicrobials this admission:  Vanc 8/7 >>  CTX  8/7 >>   Microbiology results: 8/7 BCx X 2: NG/final 8/7 MRSA PCR: positive 8/7 COVID: negative  Goal of Therapy:  AUC for Vancomycin: 400-550 Vancomycin trough 15-20 mg/L   Plan:  Administer vancomycin 1000 mg IV 1 tonight Check vancomycin random level, Scr in 24 hrs (or  sooner if AM Scr significantly improved) Follow renal function, cultures and clinical progress  Gillermina Hu, PharmD, BCPS, [facility] Clinical Pharmacist **Pharmacist phone directory can now be found on [email or web address] ([email or web address]).  Listed under [place].

## 2019-06-27 NOTE — Progress Notes (Signed)
Progress Note   Patient Demographics:    [name], is a 58 y.o. female  MRN: [id number]   DOB - 1961/09/03  Admit Date - 06/21/2019  Outpatient Primary MD for the patient is Tsosie Billing, MD (Inactive)  Outpatient Specialists: Patient been followed by Dr. Alvester Chou in the past regarding PVD of bilateral lower extremity with multiple angiograms and stent placed, she did see vascular Dr. Trula Slade for carotid stenosis, she did see Dr. Sharol Given in the past for right transmetatarsal amputation.  Patient coming from: home   HPI:    [name]  is a 59 y.o. female, with past medical history of diabetes mellitus, tobacco abuse, still smoking, CAD, peripheral vascular disease s/p angiogram and stent by Dr. Gwenlyn Found, A. Fib on Eliquis, COPD, cirrhosis, OSA, CKD stage III, hypertension. She presents to ED secondary to nonhealing left fifth toe wounds, as well sent by her PCP for hyperkalemia, apparently patient had potassium of 5.9 yesterday at PCP office, she was asked to come to ED, in ED potassium was 5, she is on Aldactone, losartan and potassium supplement, as well she was noted to have left fifth toe nonhealing ulcer, with foul-smelling odor, report is been for last 3 weeks, she has not been given any antibiotics, denies fever, chills, shortness of breath. X-ray significant for osteomyelitis of left fifth toe, potassium of 5, and reddening of 2, white blood cell of 12,000, hospitalist called to admit.    Subjective    No acute issues or events overnight, right upper extremity pain moderately improving, swelling ongoing, somewhat worse than previous.patient continues to avoid weightbearing on her left foot which has remarkably improved her pain. Otherwise patient denies chest pain, shortness of breath, nausea, vomiting, diarrhea, constipation, headache, fever, chills.   Assessment & Plan:  Active Problems:   DM  (diabetes mellitus), type 2 with peripheral vascular complications ([name])   Carotid arterial disease ([name])   Coronary artery disease   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   Chronic combined systolic and diastolic CHF (congestive heart failure) ([name])   COPD GOLD 0    CKD (chronic kidney disease), stage III ([name])   CHF (congestive heart failure) ([name])   Osteomyelitis ([name])    Left fifth toe with infected diabetic ulcer and osteomyelitis -Blood cultures remain negative to date -we will follow intraoperative cultures as well -Continue vancomycin and Rocephin -Leukocytosis stabilizing, continue to follow -Tentative plan for left fifth and possible fourth toe amputation with Dr. Donnetta Hutching scheduled 06/28/2019  PAD/PVD -Arteriogram showing occlusion of the left SFA and moderate popliteal disease -Planned left femoral to distal tibial bypass 06/28/2019 -Hold eliquis pending further work-up and evaluation- started on heparin gtt for coverage -Continue with statin, plavix for now  Diabetes mellitus, insulin dependent, poorly controlled -Likely uncontrolled in the setting of infection and stress as above  -Increase Lantus to 30 units twice daily  -Increased to resistant sliding scale insulin -A1C 7.2 (06/21/19)  Chronic combined diastolic and systolic CHF, not in acute exacerbation -Echo 10/2018 with EF 45-50% (improved from40-45% 11/2016). -Appears to be euvolemic,  continue with home dose torsemide -We will hold losartan, and Aldactone given previous hyperkalemia  Right hand swelling/pain -Likely 2/2 previously infiltrated IV -Pain and swelling improving daily -Recommend removal of all jewerly  Hyperkalemia,mild, resolved -Hold Aldactone, losartan and potassium supplement -Potassium currently quite stable, pressure well controlled off these medications -potentially could consider discontinuing them at discharge pending vitals and potassium level over the next few days  CKD 3, stable -at baseline  -Baseline appears to be around 2.2 per previous records -Remains near baseline at this point  Paroxysmal atrial fibrillation: -With normal sinus rhythm, continue with amiodarone and hold Eliquis as above -Unclear if patient needs chronic anticoagulation if paroxysmal afib and remains in sinus rhythm throughout stay - defer to outpatient cardiology for further eval/treatment.  Essential hypertension, well controlled - Continue with home medication, losartan on hold given hyperkalemia  Hyperlipidemia -Continue atorvastatin  Hypothyroidism: -Continue with levothyroxine  Tobacco abuse:  - Was counseled at length given above vascular disease, started on nicotine patch  DVT Prophylaxis heparin gtt Code Status Full code Dispo: Likely DC to home pending further eval and workup as above Condition GUARDED   Consults called: Orthopedic sx, cardiology/vascular Admission status: inpatient, continues to require IV antibiotics, surgical evaluation and possible surgical procedure/intervention during this hospitalization Time spent in minutes : >35 minutes   Home Medications:   Prior to Admission medications   Medication Sig Start Date End Date Taking? Authorizing Provider  acetaminophen (TYLENOL) 500 MG tablet Take 1,000 mg by mouth every 6 (six) hours as needed for mild pain.   Yes [provider]  albuterol (PROVENTIL HFA;VENTOLIN HFA) 108 (90 Base) MCG/ACT inhaler Inhale 2 puffs into the lungs every 6 (six) hours as needed for wheezing or shortness of breath. 08/09/17  Yes Medina-Vargas, Monina C, NP  amiodarone (PACERONE) 100 MG tablet TAKE 1 TABLET (100 MG TOTAL) BY MOUTH DAILY Patient taking differently: Take 100 mg by mouth daily.  09/20/18  Yes Larey Dresser, MD  amLODipine (NORVASC) 10 MG tablet TAKE 1 TABLET (10 MG TOTAL) BY MOUTH DAILY. 05/01/19  Yes Larey Dresser, MD  apixaban (ELIQUIS) 5 MG TABS tablet Take 1 tablet (5 mg total) by mouth 2 (two) times daily. 08/09/17   Yes Medina-Vargas, Monina C, NP  Aspirin-Caffeine (BC FAST PAIN RELIEF PO) Take 1 Package by mouth as needed (headache).   Yes [provider]  atorvastatin (LIPITOR) 40 MG tablet TAKE 1 TABLET BY MOUTH ONCE EVERY EVENING Patient taking differently: Take 40 mg by mouth daily. TAKE 1 TABLET BY MOUTH ONCE EVERY EVENING 08/09/17  Yes Medina-Vargas, Monina C, NP  bisoprolol (ZEBETA) 5 MG tablet Take 1 tablet (5 mg total) by mouth daily. 05/01/19  Yes Larey Dresser, MD  cholecalciferol (VITAMIN D) 1000 units tablet Take 1,000 Units by mouth daily.    Yes [provider]  clopidogrel (PLAVIX) 75 MG tablet Take 75 mg by mouth daily.   Yes [provider]  folic acid (FOLVITE) 1 MG tablet Take 1 tablet (1 mg total) by mouth daily. Patient taking differently: Take 1 mg by mouth daily.  10/25/18  Yes Kayleen Memos, DO  Insulin Glargine (LANTUS SOLOSTAR) 100 UNIT/ML Solostar Pen Inject 40 Units into the skin at bedtime.    Yes [provider]  Insulin Lispro (HUMALOG KWIKPEN) 200 UNIT/ML SOPN Inject 15 Units into the skin 3 (three) times daily with meals.    Yes [provider]  levothyroxine (SYNTHROID, LEVOTHROID) 50 MCG tablet Take  1 tablet (50 mcg total) by mouth daily. 08/09/17  Yes Medina-Vargas, Monina C, NP  losartan (COZAAR) 100 MG tablet TAKE 1 TABLET (100 MG TOTAL) BY MOUTH DAILY. 04/02/19  Yes Bensimhon, Shaune Pascal, MD  metolazone (ZAROXOLYN) 2.5 MG tablet Take 1 tablet (2.5 mg total) by mouth once a week. On Thursdays 01/30/19  Yes Tillery, Satira Mccallum, PA-C  Multiple Vitamin (MULTIVITAMIN WITH MINERALS) TABS tablet Take 1 tablet by mouth daily. 10/25/18  Yes Irene Pap N, DO  potassium chloride SA (K-DUR) 20 MEQ tablet Take 2 tablets (40 mEq total) by mouth daily. 05/01/19  Yes Larey Dresser, MD  spironolactone (ALDACTONE) 25 MG tablet Take 1 tablet (25 mg total) by mouth daily. 05/01/19  Yes Larey Dresser, MD  thiamine 100 MG tablet Take 1  tablet (100 mg total) by mouth daily. 10/25/18  Yes Kayleen Memos, DO  torsemide (DEMADEX) 20 MG tablet Take 2 tablets (40 mg total) by mouth 2 (two) times daily. 01/30/19  Yes Shirley Friar, PA-C  ibuprofen (ADVIL) 600 MG tablet Take 1 tablet (600 mg total) by mouth every 6 (six) hours as needed. 03/25/19   Charlann Lange, PA-C     Allergies:     Allergies  Allergen Reactions  . Gabapentin Nausea And Vomiting and Other (See Comments)    POSSIBLE SHAKING? TAKING MED CURRENTLY  . Lyrica [Pregabalin] Other (See Comments)    Shaking Pt still taking lyrica--"probably will stop taking"      Physical Exam:   Vitals  Blood pressure (!) 129/56, pulse 89, temperature 98.3 F (36.8 C), temperature source Oral, resp. rate 13, height 5\' 4"  (1.626 m), weight 102.9 kg, last menstrual period 11/27/2012, SpO2 99 %.   General well-developed female, laying in bed in no apparent distress Normal affect and insight, Not Suicidal or Homicidal, Awake Alert, Oriented X 3. No F.N deficits, ALL C.Nerves Intact, Strength 5/5 all 4 extremities, Sensation intact all 4 extremities, Plantars down going. Ears and Eyes appear Normal, Conjunctivae clear, PERRLA. Moist Oral Mucosa. Supple Neck, No JVD, No cervical lymphadenopathy appriciated, No Carotid Bruits. Symmetrical Chest wall movement, Good air movement bilaterally, CTAB. RRR, No Gallops, Rubs or Murmurs, No Parasternal Heave.  +1 edema Positive Bowel Sounds, Abdomen Soft, No tenderness, No organomegaly appriciated,No rebound guarding or rigidity. 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 No Palpable Lymph Nodes in Neck or Axillae      Data Review:    CBC Recent Labs  Lab 06/20/19 1125 06/21/19 0811  06/23/19 0651 06/24/19 0435 06/25/19 0318 06/26/19 0314 06/27/19 0341  WBC 12.4* 12.0*   < > 11.3* 12.7* 14.7*  22.3* 22.0*  HGB 9.7* 9.4*   < > 9.7* 9.5* 9.7* 9.4* 7.8*  HCT 30.3* 31.4*   < > 30.6* 28.8* 29.4* 28.1* 24.3*  PLT 342 316   < > 319 280 331 310 300  MCV 93 100.0   < > 93.0 91.7 91.6 90.4 93.1  MCH 29.8 29.9   < > 29.5 30.3 30.2 30.2 29.9  MCHC 32.0 29.9*   < > 31.7 33.0 33.0 33.5 32.1  RDW 12.7 13.4   < > 13.0 12.8 13.0 12.7 13.2  LYMPHSABS 1.6 1.7  --   --   --   --   --   --   MONOABS  --  0.9  --   --   --   --   --   --  EOSABS 0.3 0.3  --   --   --   --   --   --   BASOSABS 0.0 0.0  --   --   --   --   --   --    < > = values in this interval not displayed.   ------------------------------------------------------------------------------------------------------------------  Chemistries  Recent Labs  Lab 06/23/19 0651 06/24/19 0435 06/25/19 0318 06/26/19 0314 06/27/19 0341  NA 134* 136 134* 133* 134*  K 4.0 3.3* 3.4* 3.3* 4.6  CL 96* 96* 92* 91* 92*  CO2 22 27 27 28 27   GLUCOSE 187* 200* 184* 287* 254*  BUN 87* 90* 90* 66* 71*  CREATININE 1.78* 1.85* 1.74* 1.43* 1.90*  CALCIUM 8.6* 8.4* 8.7* 8.8* 8.7*   ------------------------------------------------------------------------------------------------------------------ estimated creatinine clearance is 38.6 mL/min (A) (by C-G formula based on SCr of 1.9 mg/dL (H)). ---------------------------------------------------------------------------------------------------------------  Urinalysis    Component Value Date/Time   COLORURINE YELLOW 03/18/2019 1540   APPEARANCEUR CLEAR 03/18/2019 1540   LABSPEC 1.013 03/18/2019 1540   PHURINE 5.0 03/18/2019 1540   GLUCOSEU NEGATIVE 03/18/2019 1540   HGBUR NEGATIVE 03/18/2019 1540   BILIRUBINUR NEGATIVE 03/18/2019 1540   KETONESUR NEGATIVE 03/18/2019 1540   PROTEINUR NEGATIVE 03/18/2019 1540   UROBILINOGEN 0.2 09/05/2015 0012   NITRITE NEGATIVE 03/18/2019 1540   LEUKOCYTESUR NEGATIVE 03/18/2019 1540     ----------------------------------------------------------------------------------------------------------------   Imaging Results:    No results found.  [name] DO on 06/27/2019 at 7:36 AM  Between 7am to 7pm - Pager - [phone]. After 7pm go to [email or web address] - password [facility]  [facility] - Office  [phone]

## 2019-06-28 ENCOUNTER — Encounter (HOSPITAL_COMMUNITY)
Admission: EM | Disposition: A | Discharge status: Skilled Nursing Facility | Payer: Self-pay | Source: Home / Self Care | Attending: Internal Medicine

## 2019-06-28 ENCOUNTER — Inpatient Hospital Stay (HOSPITAL_COMMUNITY): Payer: Medicare Other | Admitting: Certified Registered Nurse Anesthetist

## 2019-06-28 ENCOUNTER — Encounter (HOSPITAL_COMMUNITY): Payer: Self-pay | Admitting: Anesthesiology

## 2019-06-28 DIAGNOSIS — I70262 Atherosclerosis of native arteries of extremities with gangrene, left leg: Secondary | ICD-10-CM

## 2019-06-28 HISTORY — PX: FEMORAL-TIBIAL BYPASS GRAFT: SHX938

## 2019-06-28 HISTORY — PX: VEIN HARVEST: SHX6363

## 2019-06-28 HISTORY — PX: AMPUTATION: SHX166

## 2019-06-28 LAB — CBC
HCT: 20.2 % — ABNORMAL LOW (ref 36.0–46.0)
HCT: 24.9 % — ABNORMAL LOW (ref 36.0–46.0)
HCT: 24.2 % — ABNORMAL LOW (ref 36.0–46.0)
Hemoglobin: 6.6 g/dL — CL (ref 12.0–15.0)
Hemoglobin: 8.3 g/dL — ABNORMAL LOW (ref 12.0–15.0)
Hemoglobin: 8.1 g/dL — ABNORMAL LOW (ref 12.0–15.0)
MCH: 29.9 pg (ref 26.0–34.0)
MCH: 30.5 pg (ref 26.0–34.0)
MCH: 30.2 pg (ref 26.0–34.0)
MCHC: 32.7 g/dL (ref 30.0–36.0)
MCHC: 33.3 g/dL (ref 30.0–36.0)
MCHC: 33.5 g/dL (ref 30.0–36.0)
MCV: 91.4 fL (ref 80.0–100.0)
MCV: 91.5 fL (ref 80.0–100.0)
MCV: 90.3 fL (ref 80.0–100.0)
Platelets: 267 10*3/uL (ref 150–400)
Platelets: 246 10*3/uL (ref 150–400)
Platelets: 273 10*3/uL (ref 150–400)
RBC: 2.21 MIL/uL — ABNORMAL LOW (ref 3.87–5.11)
RBC: 2.72 MIL/uL — ABNORMAL LOW (ref 3.87–5.11)
RBC: 2.68 MIL/uL — ABNORMAL LOW (ref 3.87–5.11)
RDW: 12.9 % (ref 11.5–15.5)
RDW: 13.3 % (ref 11.5–15.5)
RDW: 13.8 % (ref 11.5–15.5)
WBC: 18 10*3/uL — ABNORMAL HIGH (ref 4.0–10.5)
WBC: 15.8 10*3/uL — ABNORMAL HIGH (ref 4.0–10.5)
WBC: 19.7 10*3/uL — ABNORMAL HIGH (ref 4.0–10.5)
nRBC: 0 % (ref 0.0–0.2)
nRBC: 0 % (ref 0.0–0.2)
nRBC: 0 % (ref 0.0–0.2)

## 2019-06-28 LAB — APTT: aPTT: 32 seconds (ref 24–36)

## 2019-06-28 LAB — PREPARE RBC (CROSSMATCH)

## 2019-06-28 LAB — CREATININE, SERUM
Creatinine, Ser: 1.66 mg/dL — ABNORMAL HIGH (ref 0.44–1.00)
GFR calc Af Amer: 40 mL/min — ABNORMAL LOW (ref >60)
GFR calc non Af Amer: 34 mL/min — ABNORMAL LOW (ref >60)

## 2019-06-28 LAB — GLUCOSE, CAPILLARY
Glucose-Capillary: 298 mg/dL — ABNORMAL HIGH (ref 70–99)
Glucose-Capillary: 198 mg/dL — ABNORMAL HIGH (ref 70–99)
Glucose-Capillary: 288 mg/dL — ABNORMAL HIGH (ref 70–99)
Glucose-Capillary: 348 mg/dL — ABNORMAL HIGH (ref 70–99)

## 2019-06-28 LAB — POCT I-STAT 4, (NA,K, GLUC, HGB,HCT)
Glucose, Bld: 182 mg/dL — ABNORMAL HIGH (ref 70–99)
HCT: 23 % — ABNORMAL LOW (ref 36.0–46.0)
Hemoglobin: 7.8 g/dL — ABNORMAL LOW (ref 12.0–15.0)
Potassium: 4 mmol/L (ref 3.5–5.1)
Sodium: 132 mmol/L — ABNORMAL LOW (ref 135–145)

## 2019-06-28 LAB — HEMOGLOBIN AND HEMATOCRIT, BLOOD
HCT: 20.4 % — ABNORMAL LOW (ref 36.0–46.0)
Hemoglobin: 6.7 g/dL — CL (ref 12.0–15.0)

## 2019-06-28 LAB — BASIC METABOLIC PANEL
Anion gap: 12 (ref 5–15)
BUN: 92 mg/dL — ABNORMAL HIGH (ref 6–20)
CO2: 27 mmol/L (ref 22–32)
Calcium: 8.5 mg/dL — ABNORMAL LOW (ref 8.9–10.3)
Chloride: 92 mmol/L — ABNORMAL LOW (ref 98–111)
Creatinine, Ser: 1.82 mg/dL — ABNORMAL HIGH (ref 0.44–1.00)
GFR calc Af Amer: 35 mL/min — ABNORMAL LOW (ref >60)
GFR calc non Af Amer: 31 mL/min — ABNORMAL LOW (ref >60)
Glucose, Bld: 302 mg/dL — ABNORMAL HIGH (ref 70–99)
Potassium: 4 mmol/L (ref 3.5–5.1)
Sodium: 131 mmol/L — ABNORMAL LOW (ref 135–145)

## 2019-06-28 LAB — VANCOMYCIN, RANDOM: Vancomycin Rm: 17

## 2019-06-28 LAB — PROTIME-INR
INR: 1.1 (ref 0.8–1.2)
Prothrombin Time: 13.8 seconds (ref 11.4–15.2)

## 2019-06-28 SURGERY — CREATION, BYPASS, ARTERIAL, FEMORAL TO TIBIAL, USING GRAFT
Anesthesia: General | Site: Leg Upper | Laterality: Left

## 2019-06-28 MED ORDER — GUAIFENESIN-DM 100-10 MG/5ML PO SYRP: 15.0000 mL | ORAL_SOLUTION | ORAL | Status: DC | PRN

## 2019-06-28 MED ORDER — PROTAMINE SULFATE 10 MG/ML IV SOLN
INTRAVENOUS | Status: DC | PRN
  Administered 2019-06-28: 10 mg via INTRAVENOUS
  Administered 2019-06-28: 40 mg via INTRAVENOUS

## 2019-06-28 MED ORDER — SODIUM CHLORIDE 0.9 % IV SOLN
INTRAVENOUS | Status: AC
  Filled 2019-06-28: qty 1.2

## 2019-06-28 MED ORDER — SUGAMMADEX SODIUM 200 MG/2ML IV SOLN
INTRAVENOUS | Status: DC | PRN
  Administered 2019-06-28: 200 mg via INTRAVENOUS

## 2019-06-28 MED ORDER — EPHEDRINE 5 MG/ML INJ
INTRAVENOUS | Status: AC
  Filled 2019-06-28: qty 10

## 2019-06-28 MED ORDER — ROCURONIUM BROMIDE 10 MG/ML (PF) SYRINGE
PREFILLED_SYRINGE | INTRAVENOUS | Status: AC
  Filled 2019-06-28: qty 10

## 2019-06-28 MED ORDER — 0.9 % SODIUM CHLORIDE (POUR BTL) OPTIME
TOPICAL | Status: DC | PRN
  Administered 2019-06-28: 12:00:00 2000 mL

## 2019-06-28 MED ORDER — METOPROLOL TARTRATE 5 MG/5ML IV SOLN: 2.0000 mg | INTRAVENOUS | Status: DC | PRN

## 2019-06-28 MED ORDER — LIDOCAINE 2% (20 MG/ML) 5 ML SYRINGE
INTRAMUSCULAR | Status: DC | PRN
  Administered 2019-06-28: 60 mg via INTRAVENOUS

## 2019-06-28 MED ORDER — ONDANSETRON HCL 4 MG/2ML IJ SOLN
INTRAMUSCULAR | Status: DC | PRN
  Administered 2019-06-28: 4 mg via INTRAVENOUS

## 2019-06-28 MED ORDER — ONDANSETRON HCL 4 MG/2ML IJ SOLN
INTRAMUSCULAR | Status: AC
  Filled 2019-06-28: qty 2

## 2019-06-28 MED ORDER — PROTAMINE SULFATE 10 MG/ML IV SOLN
INTRAVENOUS | Status: AC
  Filled 2019-06-28: qty 5

## 2019-06-28 MED ORDER — FENTANYL CITRATE (PF) 250 MCG/5ML IJ SOLN
INTRAMUSCULAR | Status: AC
  Filled 2019-06-28: qty 5

## 2019-06-28 MED ORDER — OXYCODONE HCL 5 MG PO TABS: 5.0000 mg | ORAL_TABLET | Freq: Once | ORAL | Status: DC | PRN

## 2019-06-28 MED ORDER — PROPOFOL 10 MG/ML IV BOLUS
INTRAVENOUS | Status: DC | PRN
  Administered 2019-06-28: 150 mg via INTRAVENOUS

## 2019-06-28 MED ORDER — SODIUM CHLORIDE 0.9 % IV SOLN
INTRAVENOUS | Status: DC | PRN
  Administered 2019-06-28: 500 mL

## 2019-06-28 MED ORDER — SODIUM CHLORIDE 0.9 % IV SOLN
INTRAVENOUS | Status: DC | PRN
  Administered 2019-06-28: 10 ug/min via INTRAVENOUS

## 2019-06-28 MED ORDER — OXYCODONE-ACETAMINOPHEN 5-325 MG PO TABS
1.0000 | ORAL_TABLET | ORAL | Status: DC | PRN
  Administered 2019-06-28 – 2019-07-08 (×25): 2 via ORAL
  Filled 2019-06-28 (×25): qty 2

## 2019-06-28 MED ORDER — HYDRALAZINE HCL 20 MG/ML IJ SOLN: 5.0000 mg | INTRAMUSCULAR | Status: DC | PRN

## 2019-06-28 MED ORDER — LACTATED RINGERS IV SOLN
INTRAVENOUS | Status: DC | PRN
  Administered 2019-06-28: 15:00:00 via INTRAVENOUS

## 2019-06-28 MED ORDER — LABETALOL HCL 5 MG/ML IV SOLN: 10.0000 mg | INTRAVENOUS | Status: DC | PRN

## 2019-06-28 MED ORDER — FENTANYL CITRATE (PF) 100 MCG/2ML IJ SOLN
INTRAMUSCULAR | Status: AC
  Filled 2019-06-28: qty 2

## 2019-06-28 MED ORDER — PROPOFOL 10 MG/ML IV BOLUS
INTRAVENOUS | Status: AC
  Filled 2019-06-28: qty 20

## 2019-06-28 MED ORDER — PHENYLEPHRINE 40 MCG/ML (10ML) SYRINGE FOR IV PUSH (FOR BLOOD PRESSURE SUPPORT)
PREFILLED_SYRINGE | INTRAVENOUS | Status: AC
  Filled 2019-06-28: qty 10

## 2019-06-28 MED ORDER — FENTANYL CITRATE (PF) 100 MCG/2ML IJ SOLN
25.0000 ug | INTRAMUSCULAR | Status: DC | PRN
  Administered 2019-06-28 (×3): 50 ug via INTRAVENOUS

## 2019-06-28 MED ORDER — ONDANSETRON HCL 4 MG/2ML IJ SOLN: 4.0000 mg | Freq: Once | INTRAMUSCULAR | Status: DC | PRN

## 2019-06-28 MED ORDER — PANTOPRAZOLE SODIUM 40 MG PO TBEC
40.0000 mg | DELAYED_RELEASE_TABLET | Freq: Every day | ORAL | Status: DC
  Administered 2019-06-29 – 2019-07-08 (×10): 40 mg via ORAL
  Filled 2019-06-28 (×10): qty 1

## 2019-06-28 MED ORDER — PHENOL 1.4 % MT LIQD: 1.0000 | OROMUCOSAL | Status: DC | PRN

## 2019-06-28 MED ORDER — FENTANYL CITRATE (PF) 100 MCG/2ML IJ SOLN
INTRAMUSCULAR | Status: DC | PRN
  Administered 2019-06-28: 50 ug via INTRAVENOUS
  Administered 2019-06-28: 100 ug via INTRAVENOUS
  Administered 2019-06-28: 50 ug via INTRAVENOUS
  Administered 2019-06-28: 25 ug via INTRAVENOUS
  Administered 2019-06-28 (×2): 50 ug via INTRAVENOUS
  Administered 2019-06-28: 25 ug via INTRAVENOUS
  Administered 2019-06-28 (×3): 50 ug via INTRAVENOUS

## 2019-06-28 MED ORDER — VANCOMYCIN HCL IN DEXTROSE 1-5 GM/200ML-% IV SOLN
1000.0000 mg | Freq: Once | INTRAVENOUS | Status: AC
  Administered 2019-06-28: 1000 mg via INTRAVENOUS
  Filled 2019-06-28: qty 200

## 2019-06-28 MED ORDER — MIDAZOLAM HCL 5 MG/5ML IJ SOLN
INTRAMUSCULAR | Status: DC | PRN
  Administered 2019-06-28: 2 mg via INTRAVENOUS

## 2019-06-28 MED ORDER — SODIUM CHLORIDE 0.9 % IV SOLN
INTRAVENOUS | Status: DC
  Administered 2019-06-28: 21:00:00 via INTRAVENOUS

## 2019-06-28 MED ORDER — HYDROMORPHONE HCL 1 MG/ML IJ SOLN
0.5000 mg | INTRAMUSCULAR | Status: DC | PRN
  Administered 2019-06-28: 0.5 mg via INTRAVENOUS

## 2019-06-28 MED ORDER — HEPARIN SODIUM (PORCINE) 1000 UNIT/ML IJ SOLN
INTRAMUSCULAR | Status: DC | PRN
  Administered 2019-06-28: 10000 [IU] via INTRAVENOUS

## 2019-06-28 MED ORDER — ROCURONIUM BROMIDE 10 MG/ML (PF) SYRINGE
PREFILLED_SYRINGE | INTRAVENOUS | Status: DC | PRN
  Administered 2019-06-28: 10 mg via INTRAVENOUS
  Administered 2019-06-28: 20 mg via INTRAVENOUS
  Administered 2019-06-28: 60 mg via INTRAVENOUS
  Administered 2019-06-28: 10 mg via INTRAVENOUS

## 2019-06-28 MED ORDER — HYDROMORPHONE HCL 1 MG/ML IJ SOLN
INTRAMUSCULAR | Status: AC
  Administered 2019-06-28: 0.5 mg
  Filled 2019-06-28: qty 1

## 2019-06-28 MED ORDER — OXYCODONE HCL 5 MG/5ML PO SOLN: 5.0000 mg | Freq: Once | ORAL | Status: DC | PRN

## 2019-06-28 MED ORDER — SODIUM CHLORIDE 0.9 % IV SOLN: INTRAVENOUS | Status: AC

## 2019-06-28 MED ORDER — DEXAMETHASONE SODIUM PHOSPHATE 4 MG/ML IJ SOLN
INTRAMUSCULAR | Status: DC | PRN
  Administered 2019-06-28: 4 mg via INTRAVENOUS

## 2019-06-28 MED ORDER — SODIUM CHLORIDE 0.9 % IV SOLN: 500.0000 mL | Freq: Once | INTRAVENOUS | Status: DC | PRN

## 2019-06-28 MED ORDER — SODIUM CHLORIDE 0.9% IV SOLUTION: Freq: Once | INTRAVENOUS | Status: DC

## 2019-06-28 MED ORDER — LIDOCAINE 2% (20 MG/ML) 5 ML SYRINGE
INTRAMUSCULAR | Status: AC
  Filled 2019-06-28: qty 5

## 2019-06-28 MED ORDER — PHENYLEPHRINE 40 MCG/ML (10ML) SYRINGE FOR IV PUSH (FOR BLOOD PRESSURE SUPPORT)
PREFILLED_SYRINGE | INTRAVENOUS | Status: DC | PRN
  Administered 2019-06-28: 80 ug via INTRAVENOUS

## 2019-06-28 MED ORDER — MIDAZOLAM HCL 2 MG/2ML IJ SOLN
INTRAMUSCULAR | Status: AC
  Filled 2019-06-28: qty 2

## 2019-06-28 MED ORDER — DEXAMETHASONE SODIUM PHOSPHATE 10 MG/ML IJ SOLN
INTRAMUSCULAR | Status: AC
  Filled 2019-06-28: qty 1

## 2019-06-28 MED ORDER — EPHEDRINE SULFATE-NACL 50-0.9 MG/10ML-% IV SOSY
PREFILLED_SYRINGE | INTRAVENOUS | Status: DC | PRN
  Administered 2019-06-28: 5 mg via INTRAVENOUS

## 2019-06-28 MED ORDER — DOCUSATE SODIUM 100 MG PO CAPS
100.0000 mg | ORAL_CAPSULE | Freq: Every day | ORAL | Status: DC
  Administered 2019-06-30 – 2019-07-08 (×9): 100 mg via ORAL
  Filled 2019-06-28 (×11): qty 1

## 2019-06-28 SURGICAL SUPPLY — 70 items
BANDAGE ESMARK 6X9 LF (GAUZE/BANDAGES/DRESSINGS) IMPLANT
BNDG ELASTIC 4X5.8 VLCR STR LF (GAUZE/BANDAGES/DRESSINGS) ×1 IMPLANT
BNDG ESMARK 6X9 LF (GAUZE/BANDAGES/DRESSINGS)
BNDG GAUZE ELAST 4 BULKY (GAUZE/BANDAGES/DRESSINGS) ×3 IMPLANT
CANISTER SUCT 3000ML PPV (MISCELLANEOUS) ×3 IMPLANT
CANNULA VESSEL 3MM 2 BLNT TIP (CANNULA) ×6 IMPLANT
CLIP LIGATING EXTRA MED SLVR (CLIP) ×4 IMPLANT
CLIP LIGATING EXTRA SM BLUE (MISCELLANEOUS) ×3 IMPLANT
COVER SURGICAL LIGHT HANDLE (MISCELLANEOUS) ×6 IMPLANT
COVER WAND RF STERILE (DRAPES) ×3 IMPLANT
CUFF TOURN SGL QUICK 34 (TOURNIQUET CUFF) ×1
CUFF TOURN SGL QUICK 42 (TOURNIQUET CUFF) IMPLANT
CUFF TRNQT CYL 34X4.125X (TOURNIQUET CUFF) IMPLANT
DERMABOND ADHESIVE PROPEN (GAUZE/BANDAGES/DRESSINGS) ×3
DERMABOND ADVANCED (GAUZE/BANDAGES/DRESSINGS) ×5
DERMABOND ADVANCED .7 DNX12 (GAUZE/BANDAGES/DRESSINGS) ×2 IMPLANT
DERMABOND ADVANCED .7 DNX6 (GAUZE/BANDAGES/DRESSINGS) IMPLANT
DRAIN SNY 10X20 3/4 PERF (WOUND CARE) IMPLANT
DRAPE EXTREMITY T 121X128X90 (DISPOSABLE) ×3 IMPLANT
DRAPE HALF SHEET 40X57 (DRAPES) IMPLANT
DRAPE X-RAY CASS 24X20 (DRAPES) IMPLANT
ELECT REM PT RETURN 9FT ADLT (ELECTROSURGICAL) ×3
ELECTRODE REM PT RTRN 9FT ADLT (ELECTROSURGICAL) ×2 IMPLANT
EVACUATOR SILICONE 100CC (DRAIN) IMPLANT
GAUZE SPONGE 4X4 12PLY STRL (GAUZE/BANDAGES/DRESSINGS) ×3 IMPLANT
GLOVE BIO SURGEON STRL SZ7.5 (GLOVE) ×1 IMPLANT
GLOVE BIO SURGEON STRL SZ8.5 (GLOVE) ×1 IMPLANT
GLOVE BIOGEL PI IND STRL 6.5 (GLOVE) IMPLANT
GLOVE BIOGEL PI IND STRL 7.0 (GLOVE) IMPLANT
GLOVE BIOGEL PI INDICATOR 6.5 (GLOVE) ×1
GLOVE BIOGEL PI INDICATOR 7.0 (GLOVE) ×2
GLOVE SS BIOGEL STRL SZ 6.5 (GLOVE) IMPLANT
GLOVE SS BIOGEL STRL SZ 7.5 (GLOVE) ×2 IMPLANT
GLOVE SUPERSENSE BIOGEL SZ 6.5 (GLOVE) ×1
GLOVE SUPERSENSE BIOGEL SZ 7.5 (GLOVE) ×2
GLOVE SURG SS PI 6.5 STRL IVOR (GLOVE) ×2 IMPLANT
GLOVE SURG SS PI 7.5 STRL IVOR (GLOVE) ×1 IMPLANT
GOWN STRL REUS W/ TWL LRG LVL3 (GOWN DISPOSABLE) ×6 IMPLANT
GOWN STRL REUS W/ TWL XL LVL3 (GOWN DISPOSABLE) IMPLANT
GOWN STRL REUS W/TWL LRG LVL3 (GOWN DISPOSABLE) ×5
GOWN STRL REUS W/TWL XL LVL3 (GOWN DISPOSABLE) ×2
INSERT FOGARTY SM (MISCELLANEOUS) IMPLANT
KIT BASIN OR (CUSTOM PROCEDURE TRAY) ×3 IMPLANT
KIT TURNOVER KIT B (KITS) ×3 IMPLANT
NEEDLE 22X1 1/2 (OR ONLY) (NEEDLE) IMPLANT
NS IRRIG 1000ML POUR BTL (IV SOLUTION) ×6 IMPLANT
PACK GENERAL/GYN (CUSTOM PROCEDURE TRAY) ×3 IMPLANT
PACK PERIPHERAL VASCULAR (CUSTOM PROCEDURE TRAY) ×3 IMPLANT
PAD ARMBOARD 7.5X6 YLW CONV (MISCELLANEOUS) ×6 IMPLANT
PADDING CAST COTTON 6X4 STRL (CAST SUPPLIES) ×1 IMPLANT
SET COLLECT BLD 21X3/4 12 (NEEDLE) IMPLANT
SPONGE LAP 18X18 RF (DISPOSABLE) ×2 IMPLANT
STOPCOCK 4 WAY LG BORE MALE ST (IV SETS) IMPLANT
SUT ETHILON 3 0 PS 1 (SUTURE) ×5 IMPLANT
SUT PROLENE 5 0 C 1 24 (SUTURE) ×3 IMPLANT
SUT PROLENE 6 0 CC (SUTURE) ×10 IMPLANT
SUT SILK 2 0 SH (SUTURE) ×3 IMPLANT
SUT SILK 3 0 (SUTURE) ×2
SUT SILK 3-0 18XBRD TIE 12 (SUTURE) IMPLANT
SUT SILK 4 0 (SUTURE) ×1
SUT SILK 4-0 18XBRD TIE 12 (SUTURE) IMPLANT
SUT VIC AB 2-0 CTX 36 (SUTURE) ×7 IMPLANT
SUT VIC AB 3-0 SH 27 (SUTURE) ×7
SUT VIC AB 3-0 SH 27X BRD (SUTURE) ×4 IMPLANT
SYR CONTROL 10ML LL (SYRINGE) IMPLANT
TOWEL GREEN STERILE (TOWEL DISPOSABLE) ×6 IMPLANT
TRAY FOLEY MTR SLVR 16FR STAT (SET/KITS/TRAYS/PACK) ×3 IMPLANT
TUBING EXTENTION W/L.L. (IV SETS) IMPLANT
UNDERPAD 30X30 (UNDERPADS AND DIAPERS) ×3 IMPLANT
WATER STERILE IRR 1000ML POUR (IV SOLUTION) ×3 IMPLANT

## 2019-06-28 NOTE — Progress Notes (Signed)
Notified Dr. Fransisco Beau of patient's H&H.  Patient will receive 2 units that will be started in short stay.  Per Dr. Fransisco Beau, will need an additional 2 units prepared for surgery.  Notified patient's nurse, Jerene Pitch, on the 4E made aware.

## 2019-06-28 NOTE — Op Note (Signed)
OPERATIVE REPORT  DATE OF SURGERY: 06/28/2019  PATIENT: [name], 58 y.o. female MRN: [id number]  DOB: [birth year]  PRE-OPERATIVE DIAGNOSIS: Critical limb ischemia with gangrene left fifth toe  POST-OPERATIVE DIAGNOSIS:  Same  PROCEDURE: Left femoral to anterior tibial bypass with non-reversed great saphenous vein and left fifth toe ray amputation  SURGEON:  Curt Jews, M.D.  PHYSICIAN ASSISTANT: Matt Eveland, PA-C  ANESTHESIA: General  EBL: per anesthesia record  Total I/O In: [id number] [I.V.:1100; Blood:630] Out: 385 [Urine:335; Blood:50]  BLOOD ADMINISTERED: none  DRAINS: none  SPECIMEN: none  COUNTS CORRECT:  YES  PATIENT DISPOSITION:  PACU - hemodynamically stable  PROCEDURE DETAILS: Patient was taken up and placed supine position where the area of the left groin left leg left foot were prepped draped in sterile fashion.  She is morbidly obese.  Her pannus was taped superiorly.  SonoSite ultrasound was used to visualize the saphenous vein which was of good caliber throughout its course.  Incision was made in the groin carried down to isolate the common superficial femoral and profundus femoris arteries.  These were encircled with Vesseloops.  The saphenous vein was identified at the saphenofemoral junction and was of good caliber.  Several skin bridges were left in the vein was harvested from the groin to the distal calf.  Tributary branches were ligated with 3-0 and 4-0 silk ties and divided.  The vein was ligated distally and divided.  The vein was also ligated at the saphenofemoral junction and divided.  The vein was gently dilated by cannulating the distal end.  It was of very good caliber.  Next incision was made over the mid calf over the lateral aspect to identify the anterior tibial artery.  The artery was extensively diseased.  This was the only runoff vessel on arteriogram.  A tunnel was created from the anterior tibial to the femoral artery.  The patient was  given 10,000 unit of intravenous heparin.  After adequate circulation time the common superficial femoral and profundus femoris arteries were occluded.  The common femoral artery was opened 11 blade and sent alternating with Potts scissors.  The vein was brought into approximation and was spatulated and sewn end-to-side to the artery with a running 6-0 Prolene suture.  The anastomosis was tested and found to be adequate.  Next the vein was brought through the prior created tunnel.  The vein is been marked to reduce risk of twisting.  The vein was brought into approximation with the anterior tibial artery.  The web roll was placed on the thigh and a pneumatic tourniquet was placed on the thigh.  The leg was elevated and exsanguinated with an Esmarch tourniquet.  The anterior tibial was artery was opened with an 11 and sent alternating the pot scissors.  It was patent but extremely diseased with a 2 dilator passing with some resistance.  The vein was cut to the appropriate length spatulated and sewn end-to-side to the artery with a running 6-0 Prolene suture.  Prior to completion of the closure the tourniquet was deflated and the usual flushing maneuvers were undertaken.  The anastomosis was then completed there was excellent Doppler flow to the foot which was graft dependent.  The patient was given 50 mg of protamine reverse heparin.  The wounds irrigated with saline and were closed with Vicryl in the subcutaneous tissue and skin was closed with 3-0 subcuticular Vicryl sutures.  Next attention was turned to the foot.  The fifth toe was amputated with  a tennis racquet type incision for a ray amputation.  The fourth toe was not involved.  There was excellent bleeding.  Bleeding was controlled with electrocautery.  The wound was irrigated with saline.  The wound was closed with 3-0 nylon sutures mattress sutures.  Sterile dressing was applied and the patient was transferred to the recovery room stable condition    [name], M.D., [facility] 06/28/2019 5:06 PM

## 2019-06-28 NOTE — Progress Notes (Signed)
ANTIBIOTIC CONSULT NOTE - Follow Up Consult  Pharmacy Consult for Vancomycin Indication:  osteomyelitis  Allergies  Allergen Reactions  . Gabapentin Nausea And Vomiting and Other (See Comments)    POSSIBLE SHAKING? TAKING MED CURRENTLY  . Lyrica [Pregabalin] Other (See Comments)    Shaking Pt still taking lyrica--"probably will stop taking"     Patient Measurements: Height: 5\' 4"  (162.6 cm) Weight: 225 lb 1.4 oz (102.1 kg) IBW/kg (Calculated) : 54.7  Vital Signs: Temp: 98.3 F (36.8 C) (08/14 1930) Temp Source: Oral (08/14 1930) BP: 142/69 (08/14 1930) Pulse Rate: 80 (08/14 1930)  Labs: Recent Labs    06/26/19 0314 06/27/19 0341 06/27/19 1853 06/28/19 0353  06/28/19 1135 06/28/19 1145 06/28/19 2027  HGB 9.4* 7.8*  --  6.6*   < > 7.8* 8.3* 8.1*  HCT 28.1* 24.3*  --  20.2*   < > 23.0* 24.9* 24.2*  PLT 310 300  --  267  --   --  246 273  APTT 115* 25  --  32  --   --   --   --   LABPROT  --   --   --  13.8  --   --   --   --   INR  --   --   --  1.1  --   --   --   --   HEPARINUNFRC >2.20*  --   --   --   --   --   --   --   CREATININE 1.43* 1.90* 2.16* 1.82*  --   --   --  1.66*   < > = values in this interval not displayed.    Estimated Creatinine Clearance: 44 mL/min (A) (by C-G formula based on SCr of 1.66 mg/dL (H)).   Assessment: 58 yr old female day # 7 vancomycin and ceftriaxone for left fifth toe osteomyelitis. Pharmacy dosing vancomycin (currently using spot dosing, due to unstable renal function). Pt S/P left femoral to anterior tibial bypass with non-reversed great saphenous vein and left fifth toe ray amputation today.  Most recent labs: WBC 22.3, afebrile, Scr 1.43>2.16>1.82>1,66, CrCl 44 mL/min (renal function unstable and improving)   Vancomycin level drawn at 20:27 today (~24 hrs after last dose of vancomycin [1000 mg]) was 17 mg/L, which is within the goal trough range for vancomycin.   Antimicrobials this admission:  Vanc 8/7 >>  CTX  8/7  >>   Microbiology results: 8/7 BCx X 2: NG/final 8/7 MRSA PCR: positive 8/7 COVID: negative  Goal of Therapy:  AUC for Vancomycin: 400-550 Vancomycin trough 15-20 mg/L   Plan:  Administer vancomycin 1000 mg IV 1 tonight Check vancomycin random level, Scr in 18 hrs (or sooner if AM Scr significantly improved) Follow renal function, cultures, clinical improvement, length of therapy  Gillermina Hu, PharmD, BCPS, [facility] Clinical Pharmacist **Pharmacist phone directory can now be found on [email or web address] ([email or web address]).  Listed under [place].

## 2019-06-28 NOTE — Progress Notes (Signed)
Progress Note   Patient Demographics:    [name], is a 58 y.o. female  MRN: [id number]   DOB - 10/29/1961  Admit Date - 06/21/2019  Outpatient Primary MD for the patient is Tsosie Billing, MD (Inactive)  Outpatient Specialists: Patient been followed by Dr. Alvester Chou in the past regarding PVD of bilateral lower extremity with multiple angiograms and stent placed, she did see vascular Dr. Trula Slade for carotid stenosis, she did see Dr. Sharol Given in the past for right transmetatarsal amputation.  Patient coming from: home   HPI:    Keir Kumpf  is a 58 y.o. female, with past medical history of diabetes mellitus, tobacco abuse, still smoking, CAD, peripheral vascular disease s/p angiogram and stent by Dr. Gwenlyn Found, A. Fib on Eliquis, COPD, cirrhosis, OSA, CKD stage III, hypertension. She presents to ED secondary to nonhealing left fifth toe wounds, as well sent by her PCP for hyperkalemia, apparently patient had potassium of 5.9 yesterday at PCP office, she was asked to come to ED, in ED potassium was 5, she is on Aldactone, losartan and potassium supplement, as well she was noted to have left fifth toe nonhealing ulcer, with foul-smelling odor, report is been for last 3 weeks, she has not been given any antibiotics, denies fever, chills, shortness of breath. X-ray significant for osteomyelitis of left fifth toe, potassium of 5, and reddening of 2, white blood cell of 12,000, hospitalist called to admit.  Interim history:  Patient evaluated by vascular and surgery given her poor vascular history and left foot wound concerning for osteo, pending left femoral to anterior tibial artery bypass graft on the left as well as likely fifth and possible fourth left toe amputation, tentatively planned for 06/28/2019.    Subjective    No acute issues or events overnight, morning labs indicate hemoglobin 6.6 this morning repeat  verified at 6.7.  Patient declines any bleeding, currently remains asymptomatic denies weakness, fatigue, shortness of breath, chest pain, nausea, vomiting, diarrhea, constipation, headache, fevers or chills.  Patient does indicate she has had very dark brown stool over the past week but declines any black-maroon or bright red blood per rectum.   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 Donnetta Hutching scheduled later today (concurrent vascular procedure as below)  PAD/PVD -Arteriogram showing occlusion of the left SFA and moderate popliteal disease -Planned left femoral to  distal tibial bypass today concurrently with amputation as above -Hold eliquis pending further work-up and evaluation- started on heparin gtt for coverage - being held perioperatively. May discontinue completely given ongoing anemia as below - pending clinical signs of blood loss. -Continue with statin, plavix for now  Acute on chronic anemia - likely b12/mixed chronic anemia of chronic disease -Hgb downtrending overnight to 6.6 this morning - repeat to verify 6.7: baseline around 9 -2u PRBC to be transfused in surgery -No overt signs/symptoms of bleeding noted in chart; patient indicates possibly darker stool  than usual but indicates it is still brown - denies black/bloody/maroon BM -Currently on heparin gtt for coverage as below for afib transitioned to heparin. Consider holding anticoagulation/antiplatelets if H/H continues to fall or overt signs/symptoms of bleeding become apparent  Diabetes mellitus, insulin dependent, poorly controlled -Likely uncontrolled in the setting of infection and stress as above  -Increase Lantus to 30 units twice daily  -Increased to resistant sliding scale insulin -A1C 7.2 (06/21/19)  Chronic combined diastolic and systolic CHF, not in acute exacerbation -Echo 10/2018 with EF 45-50% (improved from40-45% 11/2016). -Appears to be euvolemic, continue with home dose torsemide -We will hold losartan, and Aldactone given previous hyperkalemia  Right hand swelling/pain, resolving -Likely 2/2 previously infiltrated IV -Pain and swelling improving daily  Hyperkalemia, mild, resolved -Hold Aldactone, losartan and potassium supplement -Potassium currently quite stable, pressure well controlled off these medications -potentially could consider discontinuing them at discharge pending vitals and potassium level over the next few days  CKD 3, stable - at baseline -Baseline appears to be around 2.2 per previous records -Remains near baseline at this point  Paroxysmal atrial fibrillation: -With normal sinus rhythm, continue with amiodarone and hold Eliquis as above -Unclear if patient needs chronic anticoagulation if paroxysmal afib and remains in sinus rhythm throughout stay - defer to outpatient cardiology for further eval/treatment.  Essential hypertension, well controlled - Continue with home medication, losartan on hold given hyperkalemia  Hyperlipidemia -Continue atorvastatin  Hypothyroidism: -Continue with levothyroxine  Tobacco abuse:  - Was counseled at length given above vascular disease, started on nicotine patch  DVT Prophylaxis heparin gtt Code  Status Full code Dispo: Likely DC to home pending further eval and workup as above - pending PT/OT recs placement may be necessary Condition GUARDED   Consults called: Orthopedic sx, cardiology/vascular Admission status: inpatient, continues to require IV antibiotics, surgical evaluation and procedure/intervention Time spent in minutes : >35 minutes   Home Medications:   Prior to Admission medications   Medication Sig Start Date End Date Taking? Authorizing Provider  acetaminophen (TYLENOL) 500 MG tablet Take 1,000 mg by mouth every 6 (six) hours as needed for mild pain.   Yes [provider]  albuterol (PROVENTIL HFA;VENTOLIN HFA) 108 (90 Base) MCG/ACT inhaler Inhale 2 puffs into the lungs every 6 (six) hours as needed for wheezing or shortness of breath. 08/09/17  Yes Medina-Vargas, Monina C, NP  amiodarone (PACERONE) 100 MG tablet TAKE 1 TABLET (100 MG TOTAL) BY MOUTH DAILY Patient taking differently: Take 100 mg by mouth daily.  09/20/18  Yes Larey Dresser, MD  amLODipine (NORVASC) 10 MG tablet TAKE 1 TABLET (10 MG TOTAL) BY MOUTH DAILY. 05/01/19  Yes Larey Dresser, MD  apixaban (ELIQUIS) 5 MG TABS tablet Take 1 tablet (5 mg total) by mouth 2 (two) times daily. 08/09/17  Yes Medina-Vargas, Monina C, NP  Aspirin-Caffeine (BC FAST PAIN RELIEF PO) Take 1 Package by mouth as needed (headache).  Yes [provider]  atorvastatin (LIPITOR) 40 MG tablet TAKE 1 TABLET BY MOUTH ONCE EVERY EVENING Patient taking differently: Take 40 mg by mouth daily. TAKE 1 TABLET BY MOUTH ONCE EVERY EVENING 08/09/17  Yes Medina-Vargas, Monina C, NP  bisoprolol (ZEBETA) 5 MG tablet Take 1 tablet (5 mg total) by mouth daily. 05/01/19  Yes Larey Dresser, MD  cholecalciferol (VITAMIN D) 1000 units tablet Take 1,000 Units by mouth daily.    Yes [provider]  clopidogrel (PLAVIX) 75 MG tablet Take 75 mg by mouth daily.   Yes [provider]  folic acid (FOLVITE) 1 MG tablet  Take 1 tablet (1 mg total) by mouth daily. Patient taking differently: Take 1 mg by mouth daily.  10/25/18  Yes Kayleen Memos, DO  Insulin Glargine (LANTUS SOLOSTAR) 100 UNIT/ML Solostar Pen Inject 40 Units into the skin at bedtime.    Yes [provider]  Insulin Lispro (HUMALOG KWIKPEN) 200 UNIT/ML SOPN Inject 15 Units into the skin 3 (three) times daily with meals.    Yes [provider]  levothyroxine (SYNTHROID, LEVOTHROID) 50 MCG tablet Take 1 tablet (50 mcg total) by mouth daily. 08/09/17  Yes Medina-Vargas, Monina C, NP  losartan (COZAAR) 100 MG tablet TAKE 1 TABLET (100 MG TOTAL) BY MOUTH DAILY. 04/02/19  Yes Bensimhon, Shaune Pascal, MD  metolazone (ZAROXOLYN) 2.5 MG tablet Take 1 tablet (2.5 mg total) by mouth once a week. On Thursdays 01/30/19  Yes Tillery, Satira Mccallum, PA-C  Multiple Vitamin (MULTIVITAMIN WITH MINERALS) TABS tablet Take 1 tablet by mouth daily. 10/25/18  Yes Irene Pap N, DO  potassium chloride SA (K-DUR) 20 MEQ tablet Take 2 tablets (40 mEq total) by mouth daily. 05/01/19  Yes Larey Dresser, MD  spironolactone (ALDACTONE) 25 MG tablet Take 1 tablet (25 mg total) by mouth daily. 05/01/19  Yes Larey Dresser, MD  thiamine 100 MG tablet Take 1 tablet (100 mg total) by mouth daily. 10/25/18  Yes Kayleen Memos, DO  torsemide (DEMADEX) 20 MG tablet Take 2 tablets (40 mg total) by mouth 2 (two) times daily. 01/30/19  Yes Shirley Friar, PA-C  ibuprofen (ADVIL) 600 MG tablet Take 1 tablet (600 mg total) by mouth every 6 (six) hours as needed. 03/25/19   Charlann Lange, PA-C     Allergies:     Allergies  Allergen Reactions  . Gabapentin Nausea And Vomiting and Other (See Comments)    POSSIBLE SHAKING? TAKING MED CURRENTLY  . Lyrica [Pregabalin] Other (See Comments)    Shaking Pt still taking lyrica--"probably will stop taking"      Physical Exam:   Vitals  Blood pressure (!) 120/59, pulse 76, temperature 97.8 F (36.6 C), temperature  source Oral, resp. rate 15, height 5\' 4"  (1.626 m), weight 102.1 kg, last menstrual period 11/27/2012, SpO2 97 %.   General well-developed female, laying in bed in no apparent distress Psych normal affect and insight, Not Suicidal or Homicidal, Awake Alert, Oriented X 3. Neuro No F.N deficits, ALL C.Nerves Intact, Strength 5/5 all 4 extremities, Sensation intact all 4 extremities, Plantars down going. HEENT Ears and Eyes appear Normal, Conjunctivae clear, PERRLA. Moist Oral Mucosa. Neck No JVD, No cervical lymphadenopathy appriciated, No Carotid Bruits. Chest Symmetrical Chest wall movement, Good air movement bilaterally, CTAB. Card RRR, No Gallops, Rubs or Murmurs, No Parasternal Heave.  +1 edema GI Positive Bowel Sounds, Abdomen Soft, No tenderness, No organomegaly appriciated,No rebound guarding or rigidity. MSK/Extremity RUE hand and  distal forarm pitting edema 1-2+. Left fifth toe gangrenous wound, please see picture below, old right transmetatarsal amputation. Good muscle tone,  joints appear normal , no effusions.      Data Review:    CBC Recent Labs  Lab 06/21/19 0811  06/24/19 0435 06/25/19 0318 06/26/19 0314 06/27/19 0341 06/28/19 0353  WBC 12.0*   < > 12.7* 14.7* 22.3* 22.0* 18.0*  HGB 9.4*   < > 9.5* 9.7* 9.4* 7.8* 6.6*  HCT 31.4*   < > 28.8* 29.4* 28.1* 24.3* 20.2*  PLT 316   < > 280 331 310 300 267  MCV 100.0   < > 91.7 91.6 90.4 93.1 91.4  MCH 29.9   < > 30.3 30.2 30.2 29.9 29.9  MCHC 29.9*   < > 33.0 33.0 33.5 32.1 32.7  RDW 13.4   < > 12.8 13.0 12.7 13.2 12.9  LYMPHSABS 1.7  --   --   --   --   --   --   MONOABS 0.9  --   --   --   --   --   --   EOSABS 0.3  --   --   --   --   --   --   BASOSABS 0.0  --   --   --   --   --   --    < > = values in this interval not displayed.   ------------------------------------------------------------------------------------------------------------------  Chemistries  Recent Labs  Lab 06/24/19 0435 06/25/19 0318  06/26/19 0314 06/27/19 0341 06/27/19 1853 06/28/19 0353  NA 136 134* 133* 134*  --  131*  K 3.3* 3.4* 3.3* 4.6  --  4.0  CL 96* 92* 91* 92*  --  92*  CO2 27 27 28 27   --  27  GLUCOSE 200* 184* 287* 254*  --  302*  BUN 90* 90* 66* 71*  --  92*  CREATININE 1.85* 1.74* 1.43* 1.90* 2.16* 1.82*  CALCIUM 8.4* 8.7* 8.8* 8.7*  --  8.5*   ------------------------------------------------------------------------------------------------------------------ estimated creatinine clearance is 40.2 mL/min (A) (by C-G formula based on SCr of 1.82 mg/dL (H)). ---------------------------------------------------------------------------------------------------------------  Urinalysis    Component Value Date/Time   COLORURINE YELLOW 03/18/2019 1540   APPEARANCEUR CLEAR 03/18/2019 1540   LABSPEC 1.013 03/18/2019 1540   PHURINE 5.0 03/18/2019 1540   GLUCOSEU NEGATIVE 03/18/2019 1540   HGBUR NEGATIVE 03/18/2019 1540   BILIRUBINUR NEGATIVE 03/18/2019 1540   KETONESUR NEGATIVE 03/18/2019 1540   PROTEINUR NEGATIVE 03/18/2019 1540   UROBILINOGEN 0.2 09/05/2015 0012   NITRITE NEGATIVE 03/18/2019 1540   LEUKOCYTESUR NEGATIVE 03/18/2019 Coleman DO on 06/28/2019 at 7:13 AM  Between 7am to 7pm - Pager - [phone]. After 7pm go to [email or web address] - password [facility]  [facility] - Office  [phone]

## 2019-06-28 NOTE — Anesthesia Procedure Notes (Signed)
Arterial Line Insertion Performed by: Leonor Liv, CRNA, CRNA  Patient location: Pre-op. Preanesthetic checklist: patient identified, IV checked, site marked, risks and benefits discussed, surgical consent, monitors and equipment checked, pre-op evaluation, timeout performed and anesthesia consent Lidocaine 1% used for infiltration Left, radial was placed Catheter size: 20 G  Attempts: 1 Procedure performed without using ultrasound guided technique. Following insertion, Biopatch and dressing applied. Post procedure assessment: normal

## 2019-06-28 NOTE — Transfer of Care (Signed)
Immediate Anesthesia Transfer of Care Note  Patient: [name]  Procedure(s) Performed: BYPASS GRAFT LEFT LEG FEMORAL TO ANTERIOR TIBIAL ARTERY using LEFT GREATER SAPHENOUS VEIN (Left Leg Upper) AMPUTATION LEFT FIFTH TOE (Left Foot) LEFT LEG GREATER SAPHENOUS VEIN HARVEST (Left Leg Upper)  Patient Location: PACU  Anesthesia Type:General  Level of Consciousness: awake, alert  and patient cooperative  Airway & Oxygen Therapy: Patient Spontanous Breathing and Patient connected to nasal cannula oxygen  Post-op Assessment: Report given to RN and Post -op Vital signs reviewed and stable  Post vital signs: Reviewed and stable  Last Vitals:  Vitals Value Taken Time  BP 140/45 06/28/19 1630  Temp    Pulse 82 06/28/19 1632  Resp 23 06/28/19 1632  SpO2 98 % 06/28/19 1632  Vitals shown include unvalidated device data.  Last Pain:  Vitals:   06/28/19 0915  TempSrc: Oral  PainSc:       Patients Stated Pain Goal: 2 ([id number])  Complications: No apparent anesthesia complications

## 2019-06-28 NOTE — Anesthesia Preprocedure Evaluation (Addendum)
Anesthesia Evaluation  Patient identified by MRN, date of birth, ID band Patient awake    Reviewed: Allergy & Precautions, NPO status , Patient's Chart, lab work & pertinent test results, reviewed documented beta blocker date and time   History of Anesthesia Complications Negative for: history of anesthetic complications  Airway Mallampati: II  TM Distance: >3 FB Neck ROM: Full    Dental  (+) Edentulous Upper, Edentulous Lower, Dental Advisory Given   Pulmonary sleep apnea (noncompliant) , COPD, Current Smoker and Patient abstained from smoking.,    Pulmonary exam normal        Cardiovascular hypertension, Pt. on medications and Pt. on home beta blockers + CAD, + Peripheral Vascular Disease and +CHF  Normal cardiovascular exam+ dysrhythmias Atrial Fibrillation    S/p L CEA  '20 Carotid US - Right Carotid: Evidence consistent with a total occlusion of the right ICA. The ECA appears >50% stenosed. Left Carotid: Velocities in the left ICA are consistent with a 1-39% stenosis.  '19 TTE - LV was mildly dilated. There was mild concentric hypertrophy. EF 45% to 50%. Grade 2 diastolic dysfunction. Mild MR. LA was severely dilated. Mild TR. PASP was moderately to severely  increased. PA peak pressure: 57 mm Hg     Neuro/Psych PSYCHIATRIC DISORDERS Anxiety Depression negative neurological ROS     GI/Hepatic GERD  Controlled,(+) Cirrhosis     substance abuse  alcohol use, cocaine use and marijuana use,   Endo/Other  diabetes, Poorly Controlled, Type 2, Insulin DependentHypothyroidism  Hyponatremia Obesity   Renal/GU CRFRenal disease     Musculoskeletal  (+) Arthritis , narcotic dependent  Abdominal   Peds  Hematology  (+) anemia ,  Leukocytosis    Anesthesia Other Findings CT Foot - Findings consistent with osteomyelitis in the head of the proximal phalanx and throughout the middle phalanx of the little toe. 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 Negative for abscess or evidence of septic joint.   Reproductive/Obstetrics                           Anesthesia Physical Anesthesia Plan  ASA: IV  Anesthesia Plan: General   Post-op Pain Management:    Induction: Intravenous  PONV Risk Score and Plan: 4 or greater and Treatment may vary due to age or medical condition, Ondansetron, Midazolam and Scopolamine patch - Pre-op  Airway Management Planned: Oral ETT  Additional Equipment: Arterial line  Intra-op Plan:   Post-operative Plan: Possible Post-op intubation/ventilation  Informed Consent: I have reviewed the patients History and Physical, chart, labs and discussed the procedure including the risks, benefits and alternatives for the proposed anesthesia with the patient or authorized representative who has indicated his/her understanding and acceptance.       Plan Discussed with: CRNA and Anesthesiologist  Anesthesia Plan Comments:         Anesthesia Quick Evaluation

## 2019-06-28 NOTE — Progress Notes (Signed)
Patient has HBG 6.6,HCT 20.2, RBC 2.21, WBC 18, X. Blount N.P. text page results. R.N. aware

## 2019-06-28 NOTE — Anesthesia Procedure Notes (Signed)
Procedure Name: Intubation Date/Time: 06/28/2019 11:02 AM Performed by: Jenne Campus, CRNA Pre-anesthesia Checklist: Patient identified, Emergency Drugs available, Suction available and Patient being monitored Patient Re-evaluated:Patient Re-evaluated prior to induction Oxygen Delivery Method: Circle System Utilized Preoxygenation: Pre-oxygenation with 100% oxygen Induction Type: IV induction Ventilation: Mask ventilation without difficulty Laryngoscope Size: Miller and 3 Grade View: Grade I Tube type: Oral Tube size: 7.5 mm Number of attempts: 1 Airway Equipment and Method: Stylet and Oral airway Placement Confirmation: ETT inserted through vocal cords under direct vision,  positive ETCO2 and breath sounds checked- equal and bilateral Secured at: 21 cm Tube secured with: Tape Dental Injury: Teeth and Oropharynx as per pre-operative assessment

## 2019-06-28 NOTE — Progress Notes (Signed)
See orders to cross and screen for 2 unit of PRBC's and transfuse.

## 2019-06-28 NOTE — Interval H&P Note (Signed)
History and Physical Interval Note:  06/28/2019 9:57 AM  [name]  has presented today for surgery, with the diagnosis of PERIPHERAL ARTERY DISEASE NON-VIABLE TISSUE FIFTH TOE POSSIBLE FOUTH TOE LEFT FOOT.  The various methods of treatment have been discussed with the patient and family. After consideration of risks, benefits and other options for treatment, the patient has consented to  Procedure(s): LEFT FEMORAL TO ANTERIOR TIBIAL ARTERY BYPASS GRAFT (Left) AMPUTATION FIFTH TOE POSSIBLE FOURTH TOE LEFT FOOT (Left) as a surgical intervention.  The patient's history has been reviewed, patient examined, no change in status, stable for surgery.  I have reviewed the patient's chart and labs.  Questions were answered to the patient's satisfaction.     Curt Jews

## 2019-06-29 LAB — BASIC METABOLIC PANEL
Anion gap: 12 (ref 5–15)
BUN: 86 mg/dL — ABNORMAL HIGH (ref 6–20)
CO2: 25 mmol/L (ref 22–32)
Calcium: 8.2 mg/dL — ABNORMAL LOW (ref 8.9–10.3)
Chloride: 95 mmol/L — ABNORMAL LOW (ref 98–111)
Creatinine, Ser: 1.63 mg/dL — ABNORMAL HIGH (ref 0.44–1.00)
GFR calc Af Amer: 40 mL/min — ABNORMAL LOW (ref >60)
GFR calc non Af Amer: 35 mL/min — ABNORMAL LOW (ref >60)
Glucose, Bld: 394 mg/dL — ABNORMAL HIGH (ref 70–99)
Potassium: 4.8 mmol/L (ref 3.5–5.1)
Sodium: 132 mmol/L — ABNORMAL LOW (ref 135–145)

## 2019-06-29 LAB — CBC
HCT: 23.9 % — ABNORMAL LOW (ref 36.0–46.0)
Hemoglobin: 8 g/dL — ABNORMAL LOW (ref 12.0–15.0)
MCH: 30.1 pg (ref 26.0–34.0)
MCHC: 33.5 g/dL (ref 30.0–36.0)
MCV: 89.8 fL (ref 80.0–100.0)
Platelets: 244 10*3/uL (ref 150–400)
RBC: 2.66 MIL/uL — ABNORMAL LOW (ref 3.87–5.11)
RDW: 13.9 % (ref 11.5–15.5)
WBC: 17.7 10*3/uL — ABNORMAL HIGH (ref 4.0–10.5)
nRBC: 0 % (ref 0.0–0.2)

## 2019-06-29 LAB — APTT: aPTT: 30 seconds (ref 24–36)

## 2019-06-29 LAB — GLUCOSE, CAPILLARY
Glucose-Capillary: 347 mg/dL — ABNORMAL HIGH (ref 70–99)
Glucose-Capillary: 429 mg/dL — ABNORMAL HIGH (ref 70–99)
Glucose-Capillary: 308 mg/dL — ABNORMAL HIGH (ref 70–99)
Glucose-Capillary: 246 mg/dL — ABNORMAL HIGH (ref 70–99)

## 2019-06-29 LAB — CREATININE, SERUM
Creatinine, Ser: 1.43 mg/dL — ABNORMAL HIGH (ref 0.44–1.00)
GFR calc Af Amer: 47 mL/min — ABNORMAL LOW (ref >60)
GFR calc non Af Amer: 41 mL/min — ABNORMAL LOW (ref >60)

## 2019-06-29 MED ORDER — TRAZODONE HCL 50 MG PO TABS
50.0000 mg | ORAL_TABLET | Freq: Every evening | ORAL | Status: DC | PRN
  Administered 2019-06-29 – 2019-07-06 (×3): 50 mg via ORAL
  Filled 2019-06-29 (×3): qty 1

## 2019-06-29 MED ORDER — SODIUM CHLORIDE 0.9% FLUSH
10.0000 mL | INTRAVENOUS | Status: DC | PRN
  Administered 2019-07-07: 10 mL
  Filled 2019-06-29: qty 40

## 2019-06-29 MED ORDER — SODIUM CHLORIDE 0.9% FLUSH
10.0000 mL | Freq: Two times a day (BID) | INTRAVENOUS | Status: DC
  Administered 2019-06-30 – 2019-07-01 (×4): 10 mL
  Administered 2019-07-02: 20 mL
  Administered 2019-07-02 – 2019-07-08 (×11): 10 mL

## 2019-06-29 MED ORDER — INSULIN GLARGINE 100 UNIT/ML ~~LOC~~ SOLN
40.0000 [IU] | Freq: Two times a day (BID) | SUBCUTANEOUS | Status: DC
  Administered 2019-06-29 – 2019-07-04 (×12): 40 [IU] via SUBCUTANEOUS
  Filled 2019-06-29 (×14): qty 0.4

## 2019-06-29 NOTE — Progress Notes (Signed)
Progress Note   Patient Demographics:    [name], is a 58 y.o. female  MRN: [id number]   DOB - [birth year]  Admit Date - 06/21/2019  Outpatient Primary MD for the patient is Tsosie Billing, MD (Inactive)  Outpatient Specialists: Patient been followed by Dr. Alvester Chou in the past regarding PVD of bilateral lower extremity with multiple angiograms and stent placed, she did see vascular Dr. Trula Slade for carotid stenosis, she did see Dr. Sharol Given in the past for right transmetatarsal amputation.  Patient coming from: home   HPI:    [name]  is a 58 y.o. female, with past medical history of diabetes mellitus, tobacco abuse, still smoking, CAD, peripheral vascular disease s/p angiogram and stent by Dr. Gwenlyn Found, A. Fib on Eliquis, COPD, cirrhosis, OSA, CKD stage III, hypertension. She presents to ED secondary to nonhealing left fifth toe wounds, as well sent by her PCP for hyperkalemia, apparently patient had potassium of 5.9 yesterday at PCP office, she was asked to come to ED, in ED potassium was 5, she is on Aldactone, losartan and potassium supplement, as well she was noted to have left fifth toe nonhealing ulcer, with foul-smelling odor, report is been for last 3 weeks, she has not been given any antibiotics, denies fever, chills, shortness of breath. X-ray significant for osteomyelitis of left fifth toe, potassium of 5, and reddening of 2, white blood cell of 12,000, hospitalist called to admit.  Interim history:  Patient evaluated by vascular surgery given her poor vascular history and left foot wound 2/2 osteo, s/p left femoral to anterior tibial artery bypass graft on the left with fifth left toe amputation 06/28/2019.   Subjective    No acute issues or events overnight.  Pain well controlled.  Tolerating p.o. well, ambulating with some difficulty but states this is improving from previous.  Right hand swelling  and pain now resolved.  Otherwise declines chest pain, shortness of breath, nausea, vomiting, dark stool, diarrhea, constipation, headache, fevers, chills.   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   Chronic combined systolic and diastolic CHF (congestive heart failure) ([name])   COPD GOLD 0    CKD (chronic kidney disease), stage III ([name])   CHF (congestive heart failure) ([name])   Left fifth toe with infected diabetic ulcer and osteomyelitis, POA, resolving -Patient remains afebrile, leukocytosis downtrending appropriately -Blood cultures remain negative to date -continue to follow intraoperative culture -Continue vancomycin and Rocephin (MRSA Nares +) -Status post left femoral to anterior tibial bypass with non-reversed great saphenous vein and left fifth toe ray amputation 06/28/2019  PAD/PVD -Arteriogram showing occlusion of the left SFA and moderate popliteal disease -left femoral to anterior tibial bypass with non-reversed great saphenous vein -Continue heparin - hold eliquis until cleared by surgery -Continue with statin, plavix for now  Acute on chronic anemia - likely b12/mixed chronic anemia  of chronic disease -Hgb previously 6.6 - s/p 2u PRBC to be transfused 8/14 -No overt signs/symptoms of bleeding noted in chart; patient indicates possibly darker stool than usual but indicates it is still brown - denies black/bloody/maroon BM -Currently on heparin gtt for coverage as below for afib. Consider holding anticoagulation/antiplatelets if H/H continues to fall or overt signs/symptoms of bleeding become apparent  Diabetes mellitus, insulin dependent, poorly controlled -Likely uncontrolled in the setting of infection and stress as above  -Increase Lantus to 40 units twice daily   -Continue resistant sliding scale insulin -A1C 7.2 (06/21/19)  Chronic combined diastolic and systolic CHF, not in acute exacerbation -Echo 10/2018 with EF 45-50% (improved from40-45% 11/2016). -Appears to be euvolemic, continue with home dose torsemide -We will hold losartan, and Aldactone given previous hyperkalemia  Right hand swelling/pain, resolving -Likely 2/2 previously infiltrated IV -Pain and swelling improving daily  Hyperkalemia, mild, resolved -Hold Aldactone, losartan and potassium supplement -Potassium currently quite stable, pressure well controlled off these medications -potentially could consider discontinuing them at discharge pending vitals and potassium level over the next few days  CKD 3, stable - at baseline -Baseline appears to be around 2.2 per previous records -Remains near baseline at this point  Paroxysmal atrial fibrillation: -With normal sinus rhythm, continue with amiodarone and hold Eliquis as above -Unclear if patient needs chronic anticoagulation if paroxysmal afib and remains in sinus rhythm throughout stay - defer to outpatient cardiology for further eval/treatment.  Essential hypertension, well controlled - Continue with home medication, losartan on hold given hyperkalemia  Hyperlipidemia -Continue atorvastatin  Hypothyroidism: -Continue with levothyroxine  Tobacco abuse:  - Was counseled at length given above vascular disease, started on nicotine patch  DVT Prophylaxis heparin gtt Code Status Full code Dispo: Likely DC to home pending further eval and workup as above - pending PT/OT recs placement may be necessary Condition GUARDED   Consults called: Orthopedic sx, cardiology, vascular Sx Admission status: Inpatient, continues to require IV antibiotics, post op wound care and further surgical evaluation Time spent in minutes : >35 minutes   Home Medications:   Prior to Admission medications   Medication Sig Start Date End Date  Taking? Authorizing Provider  acetaminophen (TYLENOL) 500 MG tablet Take 1,000 mg by mouth every 6 (six) hours as needed for mild pain.   Yes [provider]  albuterol (PROVENTIL HFA;VENTOLIN HFA) 108 (90 Base) MCG/ACT inhaler Inhale 2 puffs into the lungs every 6 (six) hours as needed for wheezing or shortness of breath. 08/09/17  Yes Medina-Vargas, Monina C, NP  amiodarone (PACERONE) 100 MG tablet TAKE 1 TABLET (100 MG TOTAL) BY MOUTH DAILY Patient taking differently: Take 100 mg by mouth daily.  09/20/18  Yes Larey Dresser, MD  amLODipine (NORVASC) 10 MG tablet TAKE 1 TABLET (10 MG TOTAL) BY MOUTH DAILY. 05/01/19  Yes Larey Dresser, MD  apixaban (ELIQUIS) 5 MG TABS tablet Take 1 tablet (5 mg total) by mouth 2 (two) times daily. 08/09/17  Yes Medina-Vargas, Monina C, NP  Aspirin-Caffeine (BC FAST PAIN RELIEF PO) Take 1 Package by mouth as needed (headache).   Yes [provider]  atorvastatin (LIPITOR) 40 MG tablet TAKE 1 TABLET BY MOUTH ONCE EVERY EVENING Patient taking differently: Take 40 mg by mouth daily. TAKE 1 TABLET BY MOUTH ONCE EVERY EVENING 08/09/17  Yes Medina-Vargas, Monina C, NP  bisoprolol (ZEBETA) 5 MG tablet Take 1 tablet (5 mg total) by mouth daily. 05/01/19  Yes Larey Dresser, MD  cholecalciferol (VITAMIN D) 1000 units tablet Take 1,000 Units by mouth daily.    Yes [provider]  clopidogrel (PLAVIX) 75 MG tablet Take 75 mg by mouth daily.   Yes [provider]  folic acid (FOLVITE) 1 MG tablet Take 1 tablet (1 mg total) by mouth daily. Patient taking differently: Take 1 mg by mouth daily.  10/25/18  Yes Kayleen Memos, DO  Insulin Glargine (LANTUS SOLOSTAR) 100 UNIT/ML Solostar Pen Inject 40 Units into the skin at bedtime.    Yes [provider]  Insulin Lispro (HUMALOG KWIKPEN) 200 UNIT/ML SOPN Inject 15 Units into the skin 3 (three) times daily with meals.    Yes [provider]  levothyroxine (SYNTHROID, LEVOTHROID)  50 MCG tablet Take 1 tablet (50 mcg total) by mouth daily. 08/09/17  Yes Medina-Vargas, Monina C, NP  losartan (COZAAR) 100 MG tablet TAKE 1 TABLET (100 MG TOTAL) BY MOUTH DAILY. 04/02/19  Yes Bensimhon, Shaune Pascal, MD  metolazone (ZAROXOLYN) 2.5 MG tablet Take 1 tablet (2.5 mg total) by mouth once a week. On Thursdays 01/30/19  Yes Tillery, Satira Mccallum, PA-C  Multiple Vitamin (MULTIVITAMIN WITH MINERALS) TABS tablet Take 1 tablet by mouth daily. 10/25/18  Yes Irene Pap N, DO  potassium chloride SA (K-DUR) 20 MEQ tablet Take 2 tablets (40 mEq total) by mouth daily. 05/01/19  Yes Larey Dresser, MD  spironolactone (ALDACTONE) 25 MG tablet Take 1 tablet (25 mg total) by mouth daily. 05/01/19  Yes Larey Dresser, MD  thiamine 100 MG tablet Take 1 tablet (100 mg total) by mouth daily. 10/25/18  Yes Kayleen Memos, DO  torsemide (DEMADEX) 20 MG tablet Take 2 tablets (40 mg total) by mouth 2 (two) times daily. 01/30/19  Yes Shirley Friar, PA-C  ibuprofen (ADVIL) 600 MG tablet Take 1 tablet (600 mg total) by mouth every 6 (six) hours as needed. 03/25/19   Charlann Lange, PA-C     Allergies:     Allergies  Allergen Reactions  . Gabapentin Nausea And Vomiting and Other (See Comments)    POSSIBLE SHAKING? TAKING MED CURRENTLY  . Lyrica [Pregabalin] Other (See Comments)    Shaking Pt still taking lyrica--"probably will stop taking"      Physical Exam:   Vitals  Blood pressure 137/61, pulse 64, temperature 98 F (36.7 C), temperature source Oral, resp. rate 13, height 5\' 4"  (1.626 m), weight 105.5 kg, last menstrual period 11/27/2012, SpO2 98 %.   General well-developed female, laying in bed in no apparent distress Psych normal affect and insight, Not Suicidal or Homicidal, Awake Alert, Oriented X 3. Neuro No F.N deficits, ALL C.Nerves Intact, Strength 5/5 all 4 extremities, Sensation intact all 4 extremities, Plantars down going. HEENT Ears and Eyes appear Normal, Conjunctivae clear,  PERRLA. Moist Oral Mucosa. Neck No JVD, No cervical lymphadenopathy appriciated, No Carotid Bruits. Chest Symmetrical Chest wall movement, Good air movement bilaterally, CTAB. Card RRR, No Gallops, Rubs or Murmurs, No Parasternal Heave.  +1 edema GI Positive Bowel Sounds, Abdomen Soft, No tenderness, No organomegaly appriciated,No rebound guarding or rigidity. MSK/Extremity RUE hand and distal forarm pitting edema 1-2+. Left fifth toe gangrenous wound, please see picture below, old right transmetatarsal amputation. Good muscle tone,  joints appear normal , no effusions.      Data Review:    CBC Recent Labs  Lab 06/27/19 0341 06/28/19 0353 06/28/19 0812 06/28/19 1135 06/28/19 1145 06/28/19 2027 06/29/19 0259  WBC 22.0* 18.0*  --   --  15.8* 19.7* 17.7*  HGB 7.8* 6.6* 6.7* 7.8* 8.3* 8.1* 8.0*  HCT 24.3* 20.2* 20.4* 23.0* 24.9* 24.2* 23.9*  PLT 300 267  --   --  246 273 244  MCV 93.1 91.4  --   --  91.5 90.3 89.8  MCH 29.9 29.9  --   --  30.5 30.2 30.1  MCHC 32.1 32.7  --   --  33.3 33.5 33.5  RDW 13.2 12.9  --   --  13.3 13.8 13.9   ------------------------------------------------------------------------------------------------------------------  Chemistries  Recent Labs  Lab 06/25/19 0318 06/26/19 0314 06/27/19 0341 06/27/19 1853 06/28/19 0353 06/28/19 1135 06/28/19 2027 06/29/19 0259  NA 134* 133* 134*  --  131* 132*  --  132*  K 3.4* 3.3* 4.6  --  4.0 4.0  --  4.8  CL 92* 91* 92*  --  92*  --   --  95*  CO2 27 28 27   --  27  --   --  25  GLUCOSE 184* 287* 254*  --  302* 182*  --  394*  BUN 90* 66* 71*  --  92*  --   --  86*  CREATININE 1.74* 1.43* 1.90* 2.16* 1.82*  --  1.66* 1.63*  CALCIUM 8.7* 8.8* 8.7*  --  8.5*  --   --  8.2*   ------------------------------------------------------------------------------------------------------------------ estimated creatinine clearance is 45.6 mL/min (A) (by C-G formula based on SCr of 1.63 mg/dL (H)).  ---------------------------------------------------------------------------------------------------------------  Urinalysis    Component Value Date/Time   COLORURINE YELLOW 03/18/2019 1540   APPEARANCEUR CLEAR 03/18/2019 1540   LABSPEC 1.013 03/18/2019 1540   PHURINE 5.0 03/18/2019 1540   GLUCOSEU NEGATIVE 03/18/2019 1540   HGBUR NEGATIVE 03/18/2019 1540   BILIRUBINUR NEGATIVE 03/18/2019 1540   KETONESUR NEGATIVE 03/18/2019 1540   PROTEINUR NEGATIVE 03/18/2019 1540   UROBILINOGEN 0.2 09/05/2015 0012   NITRITE NEGATIVE 03/18/2019 1540   LEUKOCYTESUR NEGATIVE 03/18/2019 [place] DO on 06/29/2019 at 8:03 AM  Between 7am to 7pm - Pager - [phone]. After 7pm go to [email or web address] - password [facility]  [facility] - Office  [phone]

## 2019-06-29 NOTE — Anesthesia Postprocedure Evaluation (Signed)
Anesthesia Post Note  Patient: CURTISHA WRICE  Procedure(s) Performed: BYPASS GRAFT LEFT LEG FEMORAL TO ANTERIOR TIBIAL ARTERY using LEFT GREATER SAPHENOUS VEIN (Left Leg Upper) AMPUTATION LEFT FIFTH TOE (Left Foot) LEFT LEG GREATER SAPHENOUS VEIN HARVEST (Left Leg Upper)     Patient location during evaluation: PACU Anesthesia Type: General Level of consciousness: awake and alert Pain management: pain level controlled Vital Signs Assessment: post-procedure vital signs reviewed and stable Respiratory status: spontaneous breathing, nonlabored ventilation, respiratory function stable and patient connected to nasal cannula oxygen Cardiovascular status: blood pressure returned to baseline and stable Postop Assessment: no apparent nausea or vomiting Anesthetic complications: no    Last Vitals:  Vitals:   06/29/19 0429 06/29/19 0748  BP: 140/64 137/61  Pulse: 69 64  Resp: 12 13  Temp: 36.4 C 36.7 C  SpO2: 99% 98%    Last Pain:  Vitals:   06/29/19 0748  TempSrc: Oral  PainSc:                  Montez Hageman

## 2019-06-29 NOTE — Evaluation (Signed)
Physical Therapy Re-Evaluation Patient Details Name: [name] MRN: [id number] DOB: 21-Sep-1961 Today's Date: 06/29/2019   History of Present Illness  Pt is a 58 y/o female admitted secondary to L 5th toe osteomyelitis.Status post left femoral to anterior tibial bypass with non-reversed great saphenous vein and left fifth toe ray amputation 06/28/2019. past medical history of diabetes mellitus, tobacco abuse, still smoking, CAD, peripheral vascular disease s/p angiogram and stent, A. Fib, COPD, cirrhosis, OSA, CKD stage III, hypertension  Clinical Impression  Patient is s/p above surgery presenting with functional limitations due to the deficits listed below (see PT Problem List). Continues to improve functional ability since last assessment. She will need to be able to safely navigate stairs when she returns home. We will work with her until D/c. Current POC remains unchanged. Patient will benefit from skilled PT to increase their independence and safety with mobility to allow discharge to the venue listed below.       Follow Up Recommendations Home health PT    Equipment Recommendations  3in1 (PT)    Recommendations for Other Services       Precautions / Restrictions Precautions Precautions: Fall Precaution Comments: mod fall Restrictions Weight Bearing Restrictions: No LLE Weight Bearing: Weight bearing as tolerated Other Position/Activity Restrictions: post op shoe      Mobility  Bed Mobility               General bed mobility comments: in recliner  Transfers Overall transfer level: Needs assistance Equipment used: Rolling walker (2 wheeled) Transfers: Sit to/from Stand Sit to Stand: Min guard         General transfer comment: close guard for safety. VC for hand placement, significant effort to rise from recliner however performed without physical assist.  Ambulation/Gait Ambulation/Gait assistance: Min guard Gait Distance (Feet): 55 Feet Assistive  device: Rolling walker (2 wheeled) Gait Pattern/deviations: Step-to pattern;Decreased stride length;Antalgic;Trunk flexed Gait velocity: decreased Gait velocity interpretation: <1.31 ft/sec, indicative of household ambulator General Gait Details: Educated on safe DME use with rolling walker and cues for symmetry of gait. Highly antlagic gait pattern but able to tolerate fair amount of weight through LLE with post-op shoe. No over LOB or buckling during distance covered.  Stairs Stairs: (Declines today due to pain.)          Wheelchair Mobility    Modified Rankin (Stroke Patients Only)       Balance Overall balance assessment: Needs assistance Sitting-balance support: Feet supported Sitting balance-Leahy Scale: Good     Standing balance support: Bilateral upper extremity supported Standing balance-Leahy Scale: Poor                               Pertinent Vitals/Pain Pain Assessment: 0-10 Pain Score: 7  Pain Location: LLE Pain Descriptors / Indicators: Sharp;Burning Pain Intervention(s): Limited activity within patient's tolerance;Monitored during session;Repositioned    Home Living Family/patient expects to be discharged to:: Private residence Living Arrangements: Other relatives Available Help at Discharge: Family;Available 24 hours/day Type of Home: House     Entrance Stairs-Number of Steps: 7 Home Layout: Two level Home Equipment: [place] - 2 wheels;Walker - 4 wheels;Cane - quad;Shower seat;Wheelchair - manual Additional Comments: pt lives with cousin in second story apt, they have been trying to get a ground level apt    Prior Function Level of Independence: Independent with assistive device(s)         Comments: ambulates with use of a  cane, independent with ADLs.     Hand Dominance   Dominant Hand: Right    Extremity/Trunk Assessment   Upper Extremity Assessment Upper Extremity Assessment: Defer to OT evaluation    Lower Extremity  Assessment Lower Extremity Assessment: LLE deficits/detail LLE Deficits / Details: Guarding, gross weakness. LLE: Unable to fully assess due to pain       Communication   Communication: No difficulties  Cognition Arousal/Alertness: Awake/alert Behavior During Therapy: WFL for tasks assessed/performed Overall Cognitive Status: Within Functional Limits for tasks assessed                                        General Comments      Exercises General Exercises - Lower Extremity Ankle Circles/Pumps: AROM;Both;10 reps;Seated Quad Sets: Strengthening;10 reps;Both;Seated Long Arc Quad: Strengthening;Both;10 reps;Seated   Assessment/Plan    PT Assessment Patient needs continued PT services  PT Problem List Decreased activity tolerance;Decreased balance;Decreased mobility;Decreased coordination;Decreased knowledge of precautions;Pain;Decreased strength;Decreased range of motion;Decreased knowledge of use of DME       PT Treatment Interventions DME instruction;Gait training;Functional mobility training;Therapeutic activities;Stair training;Therapeutic exercise;Balance training;Neuromuscular re-education;Patient/family education    PT Goals (Current goals can be found in the Care Plan section)  Acute Rehab PT Goals Patient Stated Goal: decrease pain PT Goal Formulation: With patient Time For Goal Achievement: 07/13/19 Potential to Achieve Goals: Good    Frequency Min 3X/week   Barriers to discharge   stairs    Co-evaluation               AM-PAC PT "6 Clicks" Mobility  Outcome Measure Help needed turning from your back to your side while in a flat bed without using bedrails?: A Little Help needed moving from lying on your back to sitting on the side of a flat bed without using bedrails?: A Little Help needed moving to and from a bed to a chair (including a wheelchair)?: A Little Help needed standing up from a chair using your arms (e.g., wheelchair or  bedside chair)?: A Little Help needed to walk in hospital room?: A Little Help needed climbing 3-5 steps with a railing? : A Little 6 Click Score: 18    End of Session Equipment Utilized During Treatment: Gait belt Activity Tolerance: Patient limited by pain Patient left: in chair;with call bell/phone within reach;with chair alarm set   PT Visit Diagnosis: Other abnormalities of gait and mobility (R26.89);Pain;Muscle weakness (generalized) (M62.81) Pain - Right/Left: Left Pain - part of body: Ankle and joints of foot    Time: [id number] PT Time Calculation (min) (ACUTE ONLY): 14 min   Charges:   PT Evaluation $PT Re-evaluation: 1 Re-eval          Elayne Snare, PT   [name] Newer 06/29/2019, 12:53 PM

## 2019-06-29 NOTE — Progress Notes (Signed)
[facility]  Subjective  - feels ok some leg soreness wants to get up   Objective 137/61 64 98 F (36.7 C) (Oral) 13 98%  Intake/Output Summary (Last 24 hours) at 06/29/2019 1146 Last data filed at 06/29/2019 0451 Gross per 24 hour  Intake 1580 ml  Output 1635 ml  Net -55 ml   + graft pulse, incisions clean brisk AT doppler  Assessment/Planning: POD #1 fem at [facility] today  Possibly home Monday  [name] 06/29/2019 11:46 AM --  Laboratory Lab Results: Recent Labs    06/28/19 2027 06/29/19 0259  WBC 19.7* 17.7*  HGB 8.1* 8.0*  HCT 24.2* 23.9*  PLT 273 244   BMET Recent Labs    06/28/19 0353 06/28/19 1135 06/28/19 2027 06/29/19 0259  NA 131* 132*  --  132*  K 4.0 4.0  --  4.8  CL 92*  --   --  95*  CO2 27  --   --  25  GLUCOSE 302* 182*  --  394*  BUN 92*  --   --  86*  CREATININE 1.82*  --  1.66* 1.63*  CALCIUM 8.5*  --   --  8.2*    COAG Lab Results  Component Value Date   INR 1.1 06/28/2019   INR 1.0 03/28/2018   INR 1.21 04/28/2017   No results found for: PTT

## 2019-06-30 ENCOUNTER — Encounter (HOSPITAL_COMMUNITY): Payer: Self-pay | Admitting: Vascular Surgery

## 2019-06-30 DIAGNOSIS — I6521 Occlusion and stenosis of right carotid artery: Secondary | ICD-10-CM

## 2019-06-30 DIAGNOSIS — D631 Anemia in chronic kidney disease: Secondary | ICD-10-CM

## 2019-06-30 DIAGNOSIS — E1151 Type 2 diabetes mellitus with diabetic peripheral angiopathy without gangrene: Secondary | ICD-10-CM

## 2019-06-30 LAB — GLUCOSE, CAPILLARY
Glucose-Capillary: 227 mg/dL — ABNORMAL HIGH (ref 70–99)
Glucose-Capillary: 188 mg/dL — ABNORMAL HIGH (ref 70–99)
Glucose-Capillary: 258 mg/dL — ABNORMAL HIGH (ref 70–99)
Glucose-Capillary: 260 mg/dL — ABNORMAL HIGH (ref 70–99)

## 2019-06-30 LAB — BASIC METABOLIC PANEL
Anion gap: 13 (ref 5–15)
BUN: 77 mg/dL — ABNORMAL HIGH (ref 6–20)
CO2: 28 mmol/L (ref 22–32)
Calcium: 8.5 mg/dL — ABNORMAL LOW (ref 8.9–10.3)
Chloride: 93 mmol/L — ABNORMAL LOW (ref 98–111)
Creatinine, Ser: 1.39 mg/dL — ABNORMAL HIGH (ref 0.44–1.00)
GFR calc Af Amer: 49 mL/min — ABNORMAL LOW (ref >60)
GFR calc non Af Amer: 42 mL/min — ABNORMAL LOW (ref >60)
Glucose, Bld: 227 mg/dL — ABNORMAL HIGH (ref 70–99)
Potassium: 3.7 mmol/L (ref 3.5–5.1)
Sodium: 134 mmol/L — ABNORMAL LOW (ref 135–145)

## 2019-06-30 LAB — CBC
HCT: 25.2 % — ABNORMAL LOW (ref 36.0–46.0)
Hemoglobin: 8.2 g/dL — ABNORMAL LOW (ref 12.0–15.0)
MCH: 30.7 pg (ref 26.0–34.0)
MCHC: 32.5 g/dL (ref 30.0–36.0)
MCV: 94.4 fL (ref 80.0–100.0)
Platelets: 313 10*3/uL (ref 150–400)
RBC: 2.67 MIL/uL — ABNORMAL LOW (ref 3.87–5.11)
RDW: 13.6 % (ref 11.5–15.5)
WBC: 18.7 10*3/uL — ABNORMAL HIGH (ref 4.0–10.5)
nRBC: 0 % (ref 0.0–0.2)

## 2019-06-30 LAB — APTT: aPTT: 30 seconds (ref 24–36)

## 2019-06-30 MED ORDER — APIXABAN 5 MG PO TABS
5.0000 mg | ORAL_TABLET | Freq: Two times a day (BID) | ORAL | Status: DC
  Administered 2019-06-30 – 2019-07-08 (×17): 5 mg via ORAL
  Filled 2019-06-30 (×17): qty 1

## 2019-06-30 MED ORDER — VANCOMYCIN HCL IN DEXTROSE 1-5 GM/200ML-% IV SOLN
1000.0000 mg | INTRAVENOUS | Status: DC
  Administered 2019-06-30 – 2019-07-04 (×5): 1000 mg via INTRAVENOUS
  Filled 2019-06-30 (×6): qty 200

## 2019-06-30 MED ORDER — AMOXICILLIN-POT CLAVULANATE 875-125 MG PO TABS
1.0000 | ORAL_TABLET | Freq: Two times a day (BID) | ORAL | Status: DC
  Administered 2019-06-30 – 2019-07-08 (×17): 1 via ORAL
  Filled 2019-06-30 (×17): qty 1

## 2019-06-30 NOTE — Progress Notes (Signed)
Progress Note   Patient Demographics:    [name], is a 58 y.o. female  MRN: [id number]   DOB - 12-Feb-1961  Admit Date - 06/21/2019  Outpatient Primary MD for the patient is Tsosie Billing, MD (Inactive)  Outpatient Specialists: Patient been followed by Dr. Alvester Chou in the past regarding PVD of bilateral lower extremity with multiple angiograms and stent placed, she did see vascular Dr. Trula Slade for carotid stenosis, she did see Dr. Sharol Given in the past for right transmetatarsal amputation.  Patient coming from: home   HPI:    [name]  is a 58 y.o. female, with past medical history of diabetes mellitus, tobacco abuse, still smoking, CAD, peripheral vascular disease s/p angiogram and stent by Dr. Gwenlyn Found, A. Fib on Eliquis, COPD, cirrhosis, OSA, CKD stage III, hypertension. She presents to ED secondary to nonhealing left fifth toe wounds, as well sent by her PCP for hyperkalemia, apparently patient had potassium of 5.9 yesterday at PCP office, she was asked to come to ED, in ED potassium was 5, she is on Aldactone, losartan and potassium supplement, as well she was noted to have left fifth toe nonhealing ulcer, with foul-smelling odor, report is been for last 3 weeks, she has not been given any antibiotics, denies fever, chills, shortness of breath. X-ray significant for osteomyelitis of left fifth toe, potassium of 5, and reddening of 2, white blood cell of 12,000, hospitalist called to admit.  Interim history:  Patient evaluated by vascular surgery given her poor vascular history and left foot wound 2/2 osteo, s/p left femoral to anterior tibial artery bypass graft on the left with fifth left toe amputation 06/28/2019.   Subjective    No acute issues or events overnight.  Pain well controlled.  Tolerating p.o. well, ambulating with some difficulty but states this is improving from previous.  Right hand swelling  and pain now resolved.  Otherwise declines chest pain, shortness of breath, nausea, vomiting, dark stool, diarrhea, constipation, headache, fevers, chills.   Assessment & Plan:  Principal Problem:   Osteomyelitis ([place]) Active Problems:   DM (diabetes mellitus), type 2 with peripheral vascular complications ([name])   Carotid arterial disease ([name])   Coronary artery disease   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   Chronic combined systolic and diastolic CHF (congestive heart failure) ([name])   COPD GOLD 0    CKD (chronic kidney disease), stage III ([name])   CHF (congestive heart failure) ([name])   Left fifth toe with infected diabetic ulcer and osteomyelitis, POA, resolving -Patient remains afebrile, leukocytosis generally downtrending -Blood cultures remain negative to date -continue to follow for intraoperative culture -DC vanc and rocephin - likely clean margins given op note - transition to po Augmentin to cover surrounding tissue. -Status post left femoral to anterior tibial bypass with non-reversed great saphenous vein and left fifth toe ray amputation 06/28/2019  PAD/PVD -Arteriogram showing occlusion of the left SFA and moderate popliteal disease -left femoral to anterior tibial bypass with non-reversed great saphenous vein -Resume apixaban -Continue with statin, plavix for now  Acute on  chronic anemia - likely b12/mixed chronic anemia of chronic disease -Hgb previously 6.6 - s/p 2u PRBC to be transfused 8/14 -No overt signs/symptoms of bleeding noted in chart; patient indicates possibly darker stool than usual but indicates it is still brown - denies black/bloody/maroon BM -Hgb stable at this point - no further workup warranted at this time - monitor closely given re-initiation of eliquis as above  Diabetes mellitus, insulin dependent, poorly controlled -Likely uncontrolled in the setting of infection and stress as above  -Continue Lantus to 40 units twice daily  -Continue resistant sliding  scale insulin -A1C 7.2 (06/21/19)  Chronic combined diastolic and systolic CHF, not in acute exacerbation -Echo 10/2018 with EF 45-50% (improved from40-45% 11/2016). -Appears to be euvolemic, continue with home dose torsemide -We will hold losartan, and Aldactone given previous hyperkalemia  Right hand swelling/pain, resolving -Likely 2/2 previously infiltrated IV -Pain and swelling improving daily  Hyperkalemia, mild, resolved -Hold Aldactone, losartan and potassium supplement -Potassium currently quite stable, pressure well controlled off these medications -potentially could consider discontinuing them at discharge pending vitals and potassium level over the next few days  CKD 3, stable - at baseline -Baseline appears to be around 2.2 per previous records -Remains near (if not better than) baseline at this point  Paroxysmal atrial fibrillation: -With normal sinus rhythm, continue with amiodarone and resume Eliquis as above -Unclear if patient needs chronic anticoagulation if paroxysmal afib and remains in sinus rhythm throughout stay - defer to outpatient cardiology for further eval/treatment.  Essential hypertension, well controlled - Continue with home medication, losartan on hold given hyperkalemia  Hyperlipidemia -Continue atorvastatin  Hypothyroidism: -Continue with levothyroxine  Tobacco abuse:  - Was counseled at length given above vascular disease, started on nicotine patch  DVT Prophylaxis Eliquis Code Status Full code Dispo: Likely DC to home with home health tomorrow pending service setup, DME, and tolerance of PO antibiotics Condition GUARDED   Consults called: Orthopedic sx, cardiology, vascular Sx Admission status: Inpatient, continues to require post op wound care and close monitoring Time spent in minutes : >35 minutes   Home Medications:   Prior to Admission medications   Medication Sig Start Date End Date Taking? Authorizing Provider   acetaminophen (TYLENOL) 500 MG tablet Take 1,000 mg by mouth every 6 (six) hours as needed for mild pain.   Yes [provider]  albuterol (PROVENTIL HFA;VENTOLIN HFA) 108 (90 Base) MCG/ACT inhaler Inhale 2 puffs into the lungs every 6 (six) hours as needed for wheezing or shortness of breath. 08/09/17  Yes Medina-Vargas, Monina C, NP  amiodarone (PACERONE) 100 MG tablet TAKE 1 TABLET (100 MG TOTAL) BY MOUTH DAILY Patient taking differently: Take 100 mg by mouth daily.  09/20/18  Yes Larey Dresser, MD  amLODipine (NORVASC) 10 MG tablet TAKE 1 TABLET (10 MG TOTAL) BY MOUTH DAILY. 05/01/19  Yes Larey Dresser, MD  apixaban (ELIQUIS) 5 MG TABS tablet Take 1 tablet (5 mg total) by mouth 2 (two) times daily. 08/09/17  Yes Medina-Vargas, Monina C, NP  Aspirin-Caffeine (BC FAST PAIN RELIEF PO) Take 1 Package by mouth as needed (headache).   Yes [provider]  atorvastatin (LIPITOR) 40 MG tablet TAKE 1 TABLET BY MOUTH ONCE EVERY EVENING Patient taking differently: Take 40 mg by mouth daily. TAKE 1 TABLET BY MOUTH ONCE EVERY EVENING 08/09/17  Yes Medina-Vargas, Monina C, NP  bisoprolol (ZEBETA) 5 MG tablet Take 1 tablet (5 mg total) by mouth daily. 05/01/19  Yes Larey Dresser,  MD  cholecalciferol (VITAMIN D) 1000 units tablet Take 1,000 Units by mouth daily.    Yes [provider]  clopidogrel (PLAVIX) 75 MG tablet Take 75 mg by mouth daily.   Yes [provider]  folic acid (FOLVITE) 1 MG tablet Take 1 tablet (1 mg total) by mouth daily. Patient taking differently: Take 1 mg by mouth daily.  10/25/18  Yes Kayleen Memos, DO  Insulin Glargine (LANTUS SOLOSTAR) 100 UNIT/ML Solostar Pen Inject 40 Units into the skin at bedtime.    Yes [provider]  Insulin Lispro (HUMALOG KWIKPEN) 200 UNIT/ML SOPN Inject 15 Units into the skin 3 (three) times daily with meals.    Yes [provider]  levothyroxine (SYNTHROID, LEVOTHROID) 50 MCG tablet Take 1 tablet  (50 mcg total) by mouth daily. 08/09/17  Yes Medina-Vargas, Monina C, NP  losartan (COZAAR) 100 MG tablet TAKE 1 TABLET (100 MG TOTAL) BY MOUTH DAILY. 04/02/19  Yes Bensimhon, Shaune Pascal, MD  metolazone (ZAROXOLYN) 2.5 MG tablet Take 1 tablet (2.5 mg total) by mouth once a week. On Thursdays 01/30/19  Yes Tillery, Satira Mccallum, PA-C  Multiple Vitamin (MULTIVITAMIN WITH MINERALS) TABS tablet Take 1 tablet by mouth daily. 10/25/18  Yes Irene Pap N, DO  potassium chloride SA (K-DUR) 20 MEQ tablet Take 2 tablets (40 mEq total) by mouth daily. 05/01/19  Yes Larey Dresser, MD  spironolactone (ALDACTONE) 25 MG tablet Take 1 tablet (25 mg total) by mouth daily. 05/01/19  Yes Larey Dresser, MD  thiamine 100 MG tablet Take 1 tablet (100 mg total) by mouth daily. 10/25/18  Yes Kayleen Memos, DO  torsemide (DEMADEX) 20 MG tablet Take 2 tablets (40 mg total) by mouth 2 (two) times daily. 01/30/19  Yes Shirley Friar, PA-C  ibuprofen (ADVIL) 600 MG tablet Take 1 tablet (600 mg total) by mouth every 6 (six) hours as needed. 03/25/19   Charlann Lange, PA-C     Allergies:     Allergies  Allergen Reactions  . Gabapentin Nausea And Vomiting and Other (See Comments)    POSSIBLE SHAKING? TAKING MED CURRENTLY  . Lyrica [Pregabalin] Other (See Comments)    Shaking Pt still taking lyrica--"probably will stop taking"      Physical Exam:   Vitals  Blood pressure 137/62, pulse 75, temperature 98 F (36.7 C), temperature source Oral, resp. rate 14, height 5\' 4"  (1.626 m), weight 105.5 kg, last menstrual period 11/27/2012, SpO2 96 %.   General well-developed female, laying in bed in no apparent distress Psych normal affect and insight, Not Suicidal or Homicidal, Awake Alert, Oriented X 3. Neuro No F.N deficits, ALL C.Nerves Intact, Strength 5/5 all 4 extremities, Sensation intact all 4 extremities, Plantars down going. HEENT Ears and Eyes appear Normal, Conjunctivae clear, PERRLA. Moist Oral Mucosa.  Neck No JVD, No cervical lymphadenopathy appriciated, No Carotid Bruits. Chest Symmetrical Chest wall movement, Good air movement bilaterally, CTAB. Card RRR, No Gallops, Rubs or Murmurs, No Parasternal Heave.  +1 edema GI Positive Bowel Sounds, Abdomen Soft, No tenderness, No organomegaly appriciated,No rebound guarding or rigidity. MSK/Extremity RUE hand 2nd/3rd PIP joint moderately enlarged/edematous. Left foot bandage clean/dry/intact without overt bleeing or erythema.    Data Review:    CBC Recent Labs  Lab 06/28/19 0353  06/28/19 1135 06/28/19 1145 06/28/19 2027 06/29/19 0259 06/30/19 0705  WBC 18.0*  --   --  15.8* 19.7* 17.7* 18.7*  HGB 6.6*   < > 7.8* 8.3* 8.1* 8.0* 8.2*  HCT 20.2*   < > 23.0* 24.9* 24.2* 23.9* 25.2*  PLT 267  --   --  246 273 244 313  MCV 91.4  --   --  91.5 90.3 89.8 94.4  MCH 29.9  --   --  30.5 30.2 30.1 30.7  MCHC 32.7  --   --  33.3 33.5 33.5 32.5  RDW 12.9  --   --  13.3 13.8 13.9 13.6   < > = values in this interval not displayed.   ------------------------------------------------------------------------------------------------------------------  Chemistries  Recent Labs  Lab 06/26/19 0314 06/27/19 0341  06/28/19 0353 06/28/19 1135 06/28/19 2027 06/29/19 0259 06/29/19 1620 06/30/19 0705  NA 133* 134*  --  131* 132*  --  132*  --  134*  K 3.3* 4.6  --  4.0 4.0  --  4.8  --  3.7  CL 91* 92*  --  92*  --   --  95*  --  93*  CO2 28 27  --  27  --   --  25  --  28  GLUCOSE 287* 254*  --  302* 182*  --  394*  --  227*  BUN 66* 71*  --  92*  --   --  86*  --  77*  CREATININE 1.43* 1.90*   < > 1.82*  --  1.66* 1.63* 1.43* 1.39*  CALCIUM 8.8* 8.7*  --  8.5*  --   --  8.2*  --  8.5*   < > = values in this interval not displayed.   ------------------------------------------------------------------------------------------------------------------ estimated creatinine clearance is 53.5 mL/min (A) (by C-G formula based on SCr of 1.39 mg/dL (H)).  ---------------------------------------------------------------------------------------------------------------  Urinalysis    Component Value Date/Time   COLORURINE YELLOW 03/18/2019 [place] 03/18/2019 1540   LABSPEC 1.013 03/18/2019 1540   PHURINE 5.0 03/18/2019 1540   GLUCOSEU NEGATIVE 03/18/2019 1540   HGBUR NEGATIVE 03/18/2019 [place] 03/18/2019 1540   KETONESUR NEGATIVE 03/18/2019 1540   PROTEINUR NEGATIVE 03/18/2019 1540   UROBILINOGEN 0.2 09/05/2015 0012   NITRITE NEGATIVE 03/18/2019 1540   LEUKOCYTESUR NEGATIVE 03/18/2019 [place] DO on 06/30/2019 at 11:23 AM  Between 7am to 7pm - Pager - [phone]. After 7pm go to [email or web address] - password [facility]  [facility] - Office  [phone]

## 2019-06-30 NOTE — Progress Notes (Signed)
ANTIBIOTIC CONSULT NOTE - Follow Up Consult  Pharmacy Consult for Vancomycin Indication:  Wound infection   Allergies  Allergen Reactions  . Gabapentin Nausea And Vomiting and Other (See Comments)    POSSIBLE SHAKING? TAKING MED CURRENTLY  . Lyrica [Pregabalin] Other (See Comments)    Shaking Pt still taking lyrica--"probably will stop taking"     Patient Measurements: Height: 5\' 4"  (162.6 cm) Weight: 232 lb 9.4 oz (105.5 kg) IBW/kg (Calculated) : 54.7  Vital Signs: Temp: 98.6 F (37 C) (08/16 1123) Temp Source: Oral (08/16 1123) BP: 129/64 (08/16 1123) Pulse Rate: 64 (08/16 1123)  Labs: Recent Labs    06/28/19 0353  06/28/19 2027 06/29/19 0259 06/29/19 1620 06/30/19 0705  HGB 6.6*   < > 8.1* 8.0*  --  8.2*  HCT 20.2*   < > 24.2* 23.9*  --  25.2*  PLT 267   < > 273 244  --  313  APTT 32  --   --  30  --  30  LABPROT 13.8  --   --   --   --   --   INR 1.1  --   --   --   --   --   CREATININE 1.82*  --  1.66* 1.63* 1.43* 1.39*   < > = values in this interval not displayed.    Estimated Creatinine Clearance: 53.5 mL/min (A) (by C-G formula based on SCr of 1.39 mg/dL (H)).   Assessment: 58 yr old female s/p F fifth toe amputation and left femoral to anterior tibial bypass with non-reversed great saphenous vein d/t diabetic ulcer and osteomyelitis. Had just completed 11 days of vancomycin and ceftriaxone. Pharmacy was dosing vancomycin per unstable renal function.   Patient is afebrile and her WBC are 18.7 (have been elevated since 8/10) and patient is afebrile with tmax 98.6)  Per [facility] plan is to DC pt tomorrow or Tuesday.   Creatinine trending down today is 1.39, CrCl 53.5 ml/min.   Antimicrobials this admission:  Vanc 8/7 >> 8/14 >>8/16  CTX  8/7 >> 8/15 augmentin 8/16 >>  Microbiology results: 8/7 BCx X 2: NG/final 8/7 MRSA PCR: positive 8/7 COVID: negative  Goal of Therapy:     Plan:  Vancomycin 1000 mg IV Q 24 hrs. Goal AUC 400-550. Expected  AUC: 515 SCr used: 1.39 Follow renal function, cultures, clinical improvement, length of therapy    Thank you,   Eddie Candle, PharmD PGY-1 Pharmacy Resident  Phone: [phone]  **Pharmacist phone directory can now be found on [email or web address] ([email or web address]).  Listed under [place].

## 2019-06-30 NOTE — Progress Notes (Addendum)
[facility]  Subjective  - feels ok sore when walking to door   Objective 129/64 64 98.6 F (37 C) (Oral) 19 99%  Intake/Output Summary (Last 24 hours) at 06/30/2019 1142 Last data filed at 06/30/2019 1124 Gross per 24 hour  Intake 250 ml  Output 3320 ml  Net -3070 ml   + graft pulse brisk DP signal Toe amp site clean Some redness at vein harvest incision in thigh  Assessment/Planning: S/p AT bypass Walking some Possible d/c tomorrow or Tuesday Leukocytosis unknown source ? Incision will start vanc since she is already on augmentin  [name] 06/30/2019 11:42 AM --  Laboratory Lab Results: Recent Labs    06/29/19 0259 06/30/19 0705  WBC 17.7* 18.7*  HGB 8.0* 8.2*  HCT 23.9* 25.2*  PLT 244 313   BMET Recent Labs    06/29/19 0259 06/29/19 1620 06/30/19 0705  NA 132*  --  134*  K 4.8  --  3.7  CL 95*  --  93*  CO2 25  --  28  GLUCOSE 394*  --  227*  BUN 86*  --  77*  CREATININE 1.63* 1.43* 1.39*  CALCIUM 8.2*  --  8.5*    COAG Lab Results  Component Value Date   INR 1.1 06/28/2019   INR 1.0 03/28/2018   INR 1.21 04/28/2017   No results found for: PTT

## 2019-06-30 NOTE — TOC Initial Note (Addendum)
Transition of Care [facility]) - Initial/Assessment Note    Patient Details  Name: [name] MRN: [id number] Date of Birth: [birth year]  Transition of Care [facility]) CM/SW Contact:    Carles Collet, RN Phone Number: 06/30/2019, 9:58 AM  Clinical Narrative:               Spoke w patient at bedside. She is planned for tentative DC in AM tomorrow. Discussed HH and DME needs. Order placed for 3/1. (Needs requested to be delivered to room on day of DC) Discussed [facility] providers from list she would like [facility] if they can't take her Alvis Lemmings.  [facility] reviewing case. CM will continue to follow    [facility] accepted and will follow for [facility] orders   Expected Discharge Plan: [place] Barriers to Discharge: Continued Medical Work up   Patient Goals and CMS Choice Patient states their goals for this hospitalization and ongoing recovery are:: to go home CMS Medicare.gov Compare Post Acute Care list provided to:: Patient Choice offered to / list presented to : Patient  Expected Discharge Plan and Services Expected Discharge Plan: [place]   Discharge Planning Services: CM Consult Post Acute Care Choice: Home Health, Durable Medical Equipment                   DME Arranged: 3-N-1                    Prior Living Arrangements/Services   Lives with:: Self                   Activities of Daily Living Home Assistive Devices/Equipment: Environmental consultant (specify type), Wheelchair ADL Screening (condition at time of admission) Patient's cognitive ability adequate to safely complete daily activities?: Yes Is the patient deaf or have difficulty hearing?: No Does the patient have difficulty seeing, even when wearing glasses/contacts?: No Does the patient have difficulty concentrating, remembering, or making decisions?: No Patient able to express need for assistance with ADLs?: Yes Does the patient have difficulty dressing or bathing?: No Independently performs ADLs?:  Yes (appropriate for developmental age) Does the patient have difficulty walking or climbing stairs?: Yes Weakness of Legs: Both Weakness of Arms/Hands: None  Permission Sought/Granted                  Emotional Assessment              Admission diagnosis:  Osteomyelitis of left foot, unspecified type [facility]) [M86.9] Patient Active Problem List   Diagnosis Date Noted  . Osteomyelitis ([place]) 06/21/2019  . NICM (nonischemic cardiomyopathy) ([place]) 06/20/2019  . Non-healing ulcer ([place]) 06/20/2019  . Acute CHF (congestive heart failure) ([place]) 11/26/2018  . CHF (congestive heart failure) ([place]) 11/26/2018  . Acute respiratory failure ([place]) 10/21/2018  . AKI (acute kidney injury) ([place]) 08/18/2018  . Coagulation disorder ([place]) 08/09/2017  . Depression 07/21/2017  . At risk for adverse drug reaction 06/20/2017  . Peripheral neuropathy 06/20/2017  . Acute osteomyelitis of right foot ([name]) 06/13/2017  . S/P transmetatarsal amputation of foot, right ([place]) 06/05/2017  . 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 PAD (peripheral artery disease) ([place])   . SIRS (systemic inflammatory response syndrome) ([place]) 04/06/2017  . CKD (chronic kidney disease), stage III ([place]) 11/24/2016  . Suspected sleep apnea 11/24/2016  . Ulcer of toe of right foot, with necrosis of bone ([place]) 10/27/2016  . Ulcer of  left lower leg ([place]) 05/19/2016  . Severe obesity (BMI >= 40) ([place]) 02/24/2016  . COPD GOLD 0  02/24/2016  . Encounter for therapeutic drug monitoring 02/10/2016  . Symptomatic bradycardia 01/12/2016  . Essential hypertension 12/22/2015  . Chronic combined systolic and diastolic CHF (congestive heart failure) ([place])   . Wheeze   . 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 Tobacco abuse 10/23/2015  . Coronary artery disease   . DOE (dyspnea on exertion) 04/29/2015  . Diabetes mellitus type 2, uncontrolled ([place]) 02/08/2015  .  Influenza A 02/07/2015  . Wrist fracture, left, with routine healing, subsequent encounter 02/05/2015  . Wrist fracture, left, closed, initial encounter 01/29/2015  . PAF (paroxysmal atrial fibrillation) ([place]) 01/16/2015  . Carotid arterial disease ([place]) 01/16/2015  . Claudication ([place]) 01/15/2015  . Demand ischemia ([place]) 10/29/2014  . Insomnia 02/03/2014  . S/P peripheral artery angioplasty - [place]bo[place] atherectomy; R SFA 09/11/2013    Class: Acute  . Leg pain, bilateral 08/19/2013  . Hypothyroidism 07/31/2013  . Cellulitis 06/13/2013  . History of cocaine abuse ([place]) 06/13/2013  . Long term current use of anticoagulant therapy 05/20/2013  . Alcohol abuse   . Narcotic abuse ([name])   . Marijuana abuse   . Alcoholic cirrhosis ([place])   . DM (diabetes mellitus), type 2 with peripheral vascular complications ([place])    PCP:  Tsosie Billing, MD (Inactive) Pharmacy:   [id number] [address], [place] - [place] [address] Phone: [phone] Fax: [phone]  [facility], [place] [address] Phone: [phone] Fax: [place], [place] - [address] [place] [address] Phone: [phone] Fax: [phone]     Social Determinants of Health (SDOH) Interventions    Readmission Risk Interventions No flowsheet data found.

## 2019-06-30 NOTE — Discharge Instructions (Signed)

## 2019-06-30 NOTE — Evaluation (Signed)
Occupational Therapy Evaluation Patient Details Name: [name] MRN: [id number] DOB: 29-Apr-1961 Today's Date: 06/30/2019    History of Present Illness Pt is a 58 y.o. 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 past medical history of diabetes mellitus, tobacco abuse, still smoking, CAD, peripheral vascular disease s/p angiogram and stent, A. Fib, COPD, cirrhosis, OSA, CKD stage III, hypertension   Clinical Impression   Pt s/p above. Pt independent with ADLs, PTA. Feel pt will benefit from acute OT to increase independence prior to d/c. Recommending HHOT.    Follow Up Recommendations  Home health OT;Supervision/Assistance - 24 hour    Equipment Recommendations  3 in 1 bedside commode    Recommendations for Other Services       Precautions / Restrictions Precautions Precautions: Fall Restrictions Weight Bearing Restrictions: Yes LLE Weight Bearing: Weight bearing as tolerated Other Position/Activity Restrictions: post op shoe      Mobility Bed Mobility Overal bed mobility: Needs Assistance Bed Mobility: Supine to Sit     Supine to sit: Min assist        Transfers Overall transfer level: Needs assistance Equipment used: Rolling walker (2 wheeled) Transfers: Sit to/from [facility] Sit to Stand: Min assist Stand pivot transfers: Min guard            Balance    Reliance on RW for stand pivot transfer to chair.                                       ADL either performed or assessed with clinical judgement   ADL Overall ADL's : Needs assistance/impaired                     Lower Body Dressing: Moderate assistance;Sit to/from stand   Toilet Transfer: Minimal assistance;RW;Stand-pivot Armed forces technical officer Details (indicate cue type and reason): from bed to chair          Functional mobility during ADLs: Minimal assistance;Rolling walker General ADL Comments: Educated on safety and LB dressing technique. Mentioned AE.     Vision         Perception     Praxis      Pertinent Vitals/Pain Pain Assessment: 0-10 Pain Score: (8-9) Pain Location: LLE and Rt hand Pain Descriptors / Indicators: Stabbing;Burning;Throbbing Pain Intervention(s): Monitored during session;Repositioned;Patient requesting pain meds-RN notified;Limited activity within patient's tolerance     Hand Dominance Right   Extremity/Trunk Assessment Upper Extremity Assessment Upper Extremity Assessment: Overall WFL for tasks assessed   Lower Extremity Assessment Lower Extremity Assessment: Defer to PT evaluation       Communication Communication Communication: No difficulties   Cognition Arousal/Alertness: Awake/alert Behavior During Therapy: WFL for tasks assessed/performed Overall Cognitive Status: Within Functional Limits for tasks assessed                                     General Comments       Exercises     Shoulder Instructions      Home Living Family/patient expects to be discharged to:: Private residence Living Arrangements: Other relatives Available Help at Discharge: Family;Available 24 hours/day Type of Home: House Home Access: Stairs to enter [employer] of Steps: 7 Entrance Stairs-Rails:  Right;Left Home Layout: Two level Alternate Level Stairs-Number of Steps: flight Alternate Level Stairs-Rails: Right;Left Bathroom Shower/Tub: Teacher, early years/pre: Standard     Home Equipment: Environmental consultant - 2 wheels;Walker - 4 wheels;Cane - quad;Shower seat;Wheelchair - manual   Additional Comments: pt lives with cousin in second story apt, they have been trying to get a ground level apt      Prior Functioning/Environment Level of Independence: Independent with assistive device(s)        Comments: ambulates with use of  a cane, independent with ADLs.        OT Problem List: Pain;Decreased range of motion;Decreased activity tolerance;Impaired balance (sitting and/or standing);Decreased knowledge of use of DME or AE;Decreased knowledge of precautions;Decreased strength      OT Treatment/Interventions: Self-care/ADL training;DME and/or AE instruction;Therapeutic activities;Patient/family education;Balance training    OT Goals(Current goals can be found in the care plan section) Acute Rehab OT Goals Patient Stated Goal: get up OT Goal Formulation: With patient Time For Goal Achievement: 07/07/19 Potential to Achieve Goals: Good  OT Frequency: Min 2X/week   Barriers to D/C:            Co-evaluation              AM-PAC OT "6 Clicks" Daily Activity     Outcome Measure Help from another person eating meals?: None Help from another person taking care of personal grooming?: A Little Help from another person toileting, which includes using toliet, bedpan, or urinal?: A Little Help from another person bathing (including washing, rinsing, drying)?: A Little Help from another person to put on and taking off regular upper body clothing?: A Little Help from another person to put on and taking off regular lower body clothing?: A Lot 6 Click Score: 18   End of Session Equipment Utilized During Treatment: Gait belt;Rolling walker;Other (comment)(post op shoe) Nurse Communication: Patient requests pain meds  Activity Tolerance: Patient tolerated treatment well Patient left: with call bell/phone within reach;with chair alarm set;in chair  OT Visit Diagnosis: Pain Pain - Right/Left: (Rt hand and LLE) Pain - part of body: Leg;Hand(Lt leg and Rt hand)                Time: [id number] OT Time Calculation (min): 14 min Charges:  OT General Charges $OT Visit: 1 Visit OT Evaluation $OT Eval Moderate Complexity: 1 Mod   Ly Bacchi L Kaisha Wachob OTR/L 06/30/2019, 10:40 AM

## 2019-07-01 LAB — GLUCOSE, CAPILLARY
Glucose-Capillary: 133 mg/dL — ABNORMAL HIGH (ref 70–99)
Glucose-Capillary: 209 mg/dL — ABNORMAL HIGH (ref 70–99)
Glucose-Capillary: 266 mg/dL — ABNORMAL HIGH (ref 70–99)
Glucose-Capillary: 158 mg/dL — ABNORMAL HIGH (ref 70–99)

## 2019-07-01 LAB — BASIC METABOLIC PANEL
Anion gap: 10 (ref 5–15)
BUN: 79 mg/dL — ABNORMAL HIGH (ref 6–20)
CO2: 29 mmol/L (ref 22–32)
Calcium: 8 mg/dL — ABNORMAL LOW (ref 8.9–10.3)
Chloride: 98 mmol/L (ref 98–111)
Creatinine, Ser: 1.48 mg/dL — ABNORMAL HIGH (ref 0.44–1.00)
GFR calc Af Amer: 45 mL/min — ABNORMAL LOW (ref >60)
GFR calc non Af Amer: 39 mL/min — ABNORMAL LOW (ref >60)
Glucose, Bld: 152 mg/dL — ABNORMAL HIGH (ref 70–99)
Potassium: 3.7 mmol/L (ref 3.5–5.1)
Sodium: 137 mmol/L (ref 135–145)

## 2019-07-01 LAB — APTT: aPTT: 35 seconds (ref 24–36)

## 2019-07-01 LAB — CBC
HCT: 22.4 % — ABNORMAL LOW (ref 36.0–46.0)
Hemoglobin: 7.3 g/dL — ABNORMAL LOW (ref 12.0–15.0)
MCH: 29.9 pg (ref 26.0–34.0)
MCHC: 32.6 g/dL (ref 30.0–36.0)
MCV: 91.8 fL (ref 80.0–100.0)
Platelets: 280 10*3/uL (ref 150–400)
RBC: 2.44 MIL/uL — ABNORMAL LOW (ref 3.87–5.11)
RDW: 13.4 % (ref 11.5–15.5)
WBC: 14.6 10*3/uL — ABNORMAL HIGH (ref 4.0–10.5)
nRBC: 0 % (ref 0.0–0.2)

## 2019-07-01 NOTE — Progress Notes (Signed)
Inpatient Rehab Admissions:  Inpatient Rehab Consult received.  I met with patient at the bedside for rehabilitation assessment and to discuss goals and expectations of an inpatient rehab admission.  She is tearful, reporting high pain levels in LEs, back, and neck.  Has still not received heating pad.  She is very hopeful for CIR.  Will open insurance for prior authorization for possible admission later this week pending bed availability and if pt still meets functional guidelines for CIR admit.   Signed: Shann Medal, PT, DPT Admissions Coordinator [phone] 07/01/19  3:36 PM

## 2019-07-01 NOTE — Progress Notes (Signed)
Physical Therapy Treatment Patient Details Name: [name] MRN: [id number] DOB: [birth year] Today's Date: 07/01/2019    History of Present Illness Pt is a 59 y.o. 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 past medical history of diabetes mellitus, tobacco abuse, still smoking, CAD, peripheral vascular disease s/p angiogram and stent, A. Fib, COPD, cirrhosis, OSA, CKD stage III, hypertension    PT Comments    Pt was seen for PT and OT but not able to get her to climb 8 steps to go into her house.  Pt has increased pain complaints today, and note her difficulty with lifting LLE to get to bed.  Pt was able to walk a shorter trip, more widespread pain with spine and LLE, included RUE.  Will continue to work toward home but her inability to enter the house currently is going to present a logistical issue that needs to be resolved.  Nursing is addressing pain with moist heat for spine and meds.  Follow acutely to progress mobility as tolerated.   Follow Up Recommendations  CIR     Equipment Recommendations  None recommended by PT    Recommendations for Other Services Rehab consult     Precautions / Restrictions Precautions Precautions: Fall Restrictions Weight Bearing Restrictions: Yes LLE Weight Bearing: Weight bearing as tolerated Other Position/Activity Restrictions: post op shoe    Mobility  Bed Mobility Overal bed mobility: Needs Assistance Bed Mobility: Sit to Supine     Supine to sit: Min assist        Transfers Overall transfer level: Needs assistance Equipment used: Rolling walker (2 wheeled) Transfers: Sit to/from [facility] Sit to Stand: Mod assist Stand pivot transfers: Mod assist       General transfer comment: mod assist to power up with dense cues for her use of  walker  Ambulation/Gait Ambulation/Gait assistance: Min assist;Min guard Gait Distance (Feet): 18 Feet Assistive device: Rolling walker (2 wheeled) Gait Pattern/deviations: Step-through pattern;Trunk flexed;Drifts right/left;Wide base of support;Shuffle Gait velocity: decreased Gait velocity interpretation: <1.31 ft/sec, indicative of household ambulator General Gait Details: pt is speaking about pain and is willing only to walk a short trip   Stairs Stairs: (deferred)           Wheelchair Mobility    Modified Rankin (Stroke Patients Only)       Balance Overall balance assessment: Needs assistance Sitting-balance support: Feet supported Sitting balance-Leahy Scale: Good     Standing balance support: Bilateral upper extremity supported;During functional activity Standing balance-Leahy Scale: Poor Standing balance comment: using RW to steady herself                            Cognition Arousal/Alertness: Awake/alert Behavior During Therapy: Agitated;WFL for tasks assessed/performed Overall Cognitive Status: Within Functional Limits for tasks assessed                                 General Comments: pt is agitated but became calmer as PT and OT talked with her about getting heat for her spine      Exercises      General Comments General comments (skin integrity, edema, etc.): Pt is describing a greater pain on LLE with spinal stiffness and LBP, RUE complaints and limited mobility.  More frantic with  her pain today, due possibly to increased LLE sensation with her procedure      Pertinent Vitals/Pain Pain Assessment: Faces Faces Pain Scale: Hurts whole lot Pain Location: LLE and spine, R hand Pain Descriptors / Indicators: Operative site guarding;Stabbing Pain Intervention(s): Monitored during session;Premedicated before session;Repositioned    Home Living                      Prior Function            PT Goals  (current goals can now be found in the care plan section) Acute Rehab PT Goals Patient Stated Goal: go directly home Progress towards PT goals: Progressing toward goals    Frequency    Min 3X/week      PT Plan Discharge plan needs to be updated    Co-evaluation PT/OT/SLP Co-Evaluation/Treatment: Yes Reason for Co-Treatment: Complexity of the patient's impairments (multi-system involvement);For patient/therapist safety PT goals addressed during session: Mobility/safety with mobility;Balance        AM-PAC PT "6 Clicks" Mobility   Outcome Measure  Help needed turning from your back to your side while in a flat bed without using bedrails?: A Little Help needed moving from lying on your back to sitting on the side of a flat bed without using bedrails?: A Lot Help needed moving to and from a bed to a chair (including a wheelchair)?: A Lot Help needed standing up from a chair using your arms (e.g., wheelchair or bedside chair)?: A Lot Help needed to walk in hospital room?: A Little Help needed climbing 3-5 steps with a railing? : A Lot 6 Click Score: 14    End of Session Equipment Utilized During Treatment: Gait belt Activity Tolerance: Patient limited by fatigue;Patient limited by pain Patient left: in bed;with call bell/phone within reach;with bed alarm set Nurse Communication: Mobility status PT Visit Diagnosis: Other abnormalities of gait and mobility (R26.89);Muscle weakness (generalized) (M62.81);Pain;Difficulty in walking, not elsewhere classified (R26.2) Pain - Right/Left: Left Pain - part of body: Leg;Hip;Knee     Time: [id number] PT Time Calculation (min) (ACUTE ONLY): 22 min  Charges:  $Gait Training: 8-22 mins                    Ramond Dial 07/01/2019, 12:21 PM   Mee Hives, PT MS Acute Rehab Dept. Number: [place] [facility] [place]

## 2019-07-01 NOTE — Progress Notes (Signed)
Occupational Therapy Treatment Patient Details Name: MECKENZIE KELLOG MRN: [id number] DOB: Jun 27, 1961 Today's Date: 07/01/2019    History of present illness Pt is a 58 y.o. 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 past medical history of diabetes mellitus, tobacco abuse, still smoking, CAD, peripheral vascular disease s/p angiogram and stent, A. Fib, COPD, cirrhosis, OSA, CKD stage III, hypertension   OT comments  Pt has increased pain complaints today in her spine, neck and L LE . Pt limited by this pain and required encouragement to participate, bu agreeable.  Pt has 8 steps to enter her home and is not able to navigate stairs per PT. Pt became tearful and stated that she wants to go to rehab now  before going home. Pt required mod A for transfers and total A for toileting at [facility]. Pt's RN notified og her pain report and pt also requesting heat pac for beack and neck. OT to continue to follow acutely  Follow Up Recommendations  CIR;Supervision/Assistance - 24 hour    Equipment Recommendations  3 in 1 bedside commode    Recommendations for Other Services      Precautions / Restrictions Precautions Precautions: Fall Restrictions Weight Bearing Restrictions: Yes LLE Weight Bearing: Weight bearing as tolerated Other Position/Activity Restrictions: post op shoe       Mobility Bed Mobility Overal bed mobility: Needs Assistance Bed Mobility: Sit to Supine     Supine to sit: Min assist Sit to supine: Min assist      Transfers Overall transfer level: Needs assistance Equipment used: Rolling walker (2 wheeled) Transfers: Sit to/from [facility] Sit to Stand: Mod assist Stand pivot transfers: Mod assist       General transfer comment: mod assist to power up with dense cues for her use of walker    Balance Overall  balance assessment: Needs assistance Sitting-balance support: Feet supported Sitting balance-Leahy Scale: Good     Standing balance support: Bilateral upper extremity supported;During functional activity Standing balance-Leahy Scale: Poor Standing balance comment: using RW to steady herself                           ADL either performed or assessed with clinical judgement   ADL Overall ADL's : Needs assistance/impaired                         Toilet Transfer: RW;Stand-pivot;Moderate assistance;Minimal assistance;BSC   Toileting- Clothing Manipulation and Hygiene: Moderate assistance;Total assistance Toileting - Clothing Manipulation Details (indicate cue type and reason): total A for hygiene, mod A clothing mgt     Functional mobility during ADLs: Moderate assistance;Minimal assistance;Rolling walker       Vision Patient Visual Report: No change from baseline     Perception     Praxis      Cognition Arousal/Alertness: Awake/alert Behavior During Therapy: Agitated;WFL for tasks assessed/performed Overall Cognitive Status: Within Functional Limits for tasks assessed                                 General Comments: pt is agitated but became calmer as PT and OT talked with her about getting heat for her spine        Exercises     Shoulder Instructions  General Comments Pt is describing a greater pain on LLE with spinal stiffness and LBP, RUE complaints and limited mobility.  More frantic with her pain today, due possibly to increased LLE sensation with her procedure    Pertinent Vitals/ Pain       Pain Assessment: Faces Faces Pain Scale: Hurts whole lot Pain Location: LLE and spine, R hand Pain Descriptors / Indicators: Operative site guarding;Stabbing Pain Intervention(s): Limited activity within patient's tolerance;Monitored during session;Premedicated before session;Repositioned  Home Living                                           Prior Functioning/Environment              Frequency  Min 2X/week        Progress Toward Goals  OT Goals(current goals can now be found in the care plan section)  Progress towards OT goals: OT to reassess next treatment  Acute Rehab OT Goals Patient Stated Goal: "go to rehab first instead of home" OT Goal Formulation: With patient  Plan Discharge plan remains appropriate    Co-evaluation    PT/OT/SLP Co-Evaluation/Treatment: Yes Reason for Co-Treatment: Complexity of the patient's impairments (multi-system involvement);For patient/therapist safety PT goals addressed during session: Mobility/safety with mobility;Balance OT goals addressed during session: ADL's and self-care;Proper use of [employer] equipment and DME      AM-PAC OT "6 Clicks" Daily Activity     Outcome Measure   Help from another person eating meals?: None Help from another person taking care of personal grooming?: A Little Help from another person toileting, which includes using toliet, bedpan, or urinal?: A Lot Help from another person bathing (including washing, rinsing, drying)?: A Little Help from another person to put on and taking off regular upper body clothing?: A Little Help from another person to put on and taking off regular lower body clothing?: A Lot 6 Click Score: 17    End of Session Equipment Utilized During Treatment: Gait belt;Rolling walker;Other (comment)(BSC)  OT Visit Diagnosis: Pain;Other abnormalities of gait and mobility (R26.89);Muscle weakness (generalized) (M62.81) Pain - Right/Left: Left Pain - part of body: Leg;Ankle and joints of foot(back)   Activity Tolerance Patient limited by pain   Patient Left in bed;with bed alarm set;with call bell/phone within reach   Nurse Communication          Time: [id number] OT Time Calculation (min): 23 min  Charges: OT Treatments $Self Care/Home Management : 8-22 mins     Britt Bottom 07/01/2019, 2:13 PM

## 2019-07-01 NOTE — Progress Notes (Signed)
Physical medicine rehab consult requested chart reviewed.  Presently physical and Occupational Therapy are recommending home health therapies patient minimal guard with a rolling walker.  She does have assistance at home.

## 2019-07-01 NOTE — Progress Notes (Signed)
Progress Note   Patient Demographics:    [name], is a 58 y.o. female  MRN: [id number]   DOB - 1961/05/06  Admit Date - 06/21/2019  Outpatient Primary MD for the patient is Tsosie Billing, MD (Inactive)  Outpatient Specialists: Patient been followed by Dr. Alvester Chou in the past regarding PVD of bilateral lower extremity with multiple angiograms and stent placed, she did see vascular Dr. Trula Slade for carotid stenosis, she did see Dr. Sharol Given in the past for right transmetatarsal amputation.  Patient coming from: home   HPI:    [name]  is a 58 y.o. female, with past medical history of diabetes mellitus, tobacco abuse, still smoking, CAD, peripheral vascular disease s/p angiogram and stent by Dr. Gwenlyn Found, A. Fib on Eliquis, COPD, cirrhosis, OSA, CKD stage III, hypertension. She presents to ED secondary to nonhealing left fifth toe wounds, as well sent by her PCP for hyperkalemia, apparently patient had potassium of 5.9 yesterday at PCP office, she was asked to come to ED, in ED potassium was 5, she is on Aldactone, losartan and potassium supplement, as well she was noted to have left fifth toe nonhealing ulcer, with foul-smelling odor, report is been for last 3 weeks, she has not been given any antibiotics, denies fever, chills, shortness of breath. X-ray significant for osteomyelitis of left fifth toe, potassium of 5, and reddening of 2, white blood cell of 12,000, hospitalist called to admit.  Interim history:  Patient evaluated by vascular surgery given her poor vascular history and left foot wound 2/2 osteo, s/p left femoral to anterior tibial artery bypass graft on the left with fifth left toe amputation 06/28/2019.   Subjective    No acute issues or events overnight.  Pain well controlled.  Tolerating p.o. well, ambulating with some difficulty but states this is improving from previous. Right hand swelling  and pain now resolved.  Otherwise declines chest pain, shortness of breath, nausea, vomiting, dark stool, diarrhea, constipation, headache, fevers, chills.   Assessment & Plan:  Principal Problem:   Osteomyelitis ([place]) Active Problems:   DM (diabetes mellitus), type 2 with peripheral vascular complications ([name])   Carotid arterial disease ([name])   Coronary artery disease   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   Chronic combined systolic and diastolic CHF (congestive heart failure) ([name])   COPD GOLD 0    CKD (chronic kidney disease), stage III ([name])   CHF (congestive heart failure) ([name])   Left fifth toe with infected diabetic ulcer and osteomyelitis, POA, resolving -Patient remains afebrile -Leukocytosis generally downtrending: 22.0> 18.0>15.8>19.7>17.7>18.7>14.6 -Blood cultures remain negative to date -continue to follow for intraoperative culture -Previously on vanc and rocephin preoperatively - likely clean margins given op note - transition to po Augmentin to cover surrounding tissue. -Status post left femoral to anterior tibial bypass with non-reversed great saphenous vein and left fifth toe ray amputation 06/28/2019  PAD/PVD -Vascular surgery initiated vancomycin pos-operatively - will follow along to ensure no wound issues or concern for localized infection -Arteriogram showing occlusion of the left SFA and moderate popliteal disease -  left femoral to anterior tibial bypass with non-reversed great saphenous vein -Continue apixaban -Continue with statin, plavix for now  Acute on chronic anemia - likely b12 w/ mixed chronic anemia of chronic disease -Hgb previously 6.6 - s/p 2u PRBC to be transfused 8/14 -No overt signs/symptoms of bleeding noted in chart; patient indicates possibly darker stool than usual but indicates it is still brown - denies black/bloody/maroon BM -Hgb moderately stable - somewhat downtrending overnight but minimally  Diabetes mellitus, insulin dependent, poorly  controlled -Likely uncontrolled in the setting of infection and stress as above  -Continue Lantus to 40 units twice daily  -Continue resistant sliding scale insulin -A1C 7.2 (06/21/19)  Chronic combined diastolic and systolic CHF, not in acute exacerbation -Echo 10/2018 with EF 45-50% (improved from40-45% 11/2016). -Appears to be euvolemic, continue with home dose torsemide -We will hold losartan, and Aldactone given previous hyperkalemia  Right hand swelling/pain, resolved -Likely 2/2 previously infiltrated IV -Pain and swelling improving daily  Hyperkalemia, mild, resolved -Hold Aldactone, losartan and potassium supplement -Potassium currently quite stable, pressure well controlled off these medications - potentially could consider discontinuing them at discharge pending vitals and potassium level over the next few days  CKD 3, stable - at baseline -Baseline appears to be around 2.2 per previous records -Remains near (if not better than) baseline at this point  Paroxysmal atrial fibrillation: -With normal sinus rhythm, continue with amiodarone and resume Eliquis as above -Unclear if patient needs chronic anticoagulation if paroxysmal afib and remains in sinus rhythm throughout stay - defer to outpatient cardiology for further eval/treatment.  Essential hypertension, well controlled - Continue amlodipine/torsemide only - Holding losartan and aldactone as above with well controlled BP - will discontinue at discharge - Likely poor medication compliance at home given control while holding additional meds  Hyperlipidemia -Continue atorvastatin  Hypothyroidism: -Continue with levothyroxine  Tobacco abuse:  - Was counseled at length given above vascular disease, started on nicotine patch  DVT Prophylaxis Eliquis Code Status Full code Dispo: Likely DC to home with home health tomorrow pending further need for IV abx with vascular surgery; home health service setup, DME, and  tolerance of PO antibiotics Condition Guarded Consults called: Orthopedic sx, cardiology, vascular Sx Admission status: Inpatient, continues to require post op wound care and close monitoring; IV abx ongoing Time spent in minutes : >35 minutes   Home Medications:   Prior to Admission medications   Medication Sig Start Date End Date Taking? Authorizing Provider  acetaminophen (TYLENOL) 500 MG tablet Take 1,000 mg by mouth every 6 (six) hours as needed for mild pain.   Yes [provider]  albuterol (PROVENTIL HFA;VENTOLIN HFA) 108 (90 Base) MCG/ACT inhaler Inhale 2 puffs into the lungs every 6 (six) hours as needed for wheezing or shortness of breath. 08/09/17  Yes Medina-Vargas, Monina C, NP  amiodarone (PACERONE) 100 MG tablet TAKE 1 TABLET (100 MG TOTAL) BY MOUTH DAILY Patient taking differently: Take 100 mg by mouth daily.  09/20/18  Yes Larey Dresser, MD  amLODipine (NORVASC) 10 MG tablet TAKE 1 TABLET (10 MG TOTAL) BY MOUTH DAILY. 05/01/19  Yes Larey Dresser, MD  apixaban (ELIQUIS) 5 MG TABS tablet Take 1 tablet (5 mg total) by mouth 2 (two) times daily. 08/09/17  Yes Medina-Vargas, Monina C, NP  Aspirin-Caffeine (BC FAST PAIN RELIEF PO) Take 1 Package by mouth as needed (headache).   Yes [provider]  atorvastatin (LIPITOR) 40 MG tablet TAKE 1 TABLET BY MOUTH ONCE EVERY  EVENING Patient taking differently: Take 40 mg by mouth daily. TAKE 1 TABLET BY MOUTH ONCE EVERY EVENING 08/09/17  Yes Medina-Vargas, Monina C, NP  bisoprolol (ZEBETA) 5 MG tablet Take 1 tablet (5 mg total) by mouth daily. 05/01/19  Yes Larey Dresser, MD  cholecalciferol (VITAMIN D) 1000 units tablet Take 1,000 Units by mouth daily.    Yes [provider]  clopidogrel (PLAVIX) 75 MG tablet Take 75 mg by mouth daily.   Yes [provider]  folic acid (FOLVITE) 1 MG tablet Take 1 tablet (1 mg total) by mouth daily. Patient taking differently: Take 1 mg by mouth daily.  10/25/18  Yes  Kayleen Memos, DO  Insulin Glargine (LANTUS SOLOSTAR) 100 UNIT/ML Solostar Pen Inject 40 Units into the skin at bedtime.    Yes [provider]  Insulin Lispro (HUMALOG KWIKPEN) 200 UNIT/ML SOPN Inject 15 Units into the skin 3 (three) times daily with meals.    Yes [provider]  levothyroxine (SYNTHROID, LEVOTHROID) 50 MCG tablet Take 1 tablet (50 mcg total) by mouth daily. 08/09/17  Yes Medina-Vargas, Monina C, NP  losartan (COZAAR) 100 MG tablet TAKE 1 TABLET (100 MG TOTAL) BY MOUTH DAILY. 04/02/19  Yes Bensimhon, Shaune Pascal, MD  metolazone (ZAROXOLYN) 2.5 MG tablet Take 1 tablet (2.5 mg total) by mouth once a week. On Thursdays 01/30/19  Yes Tillery, Satira Mccallum, PA-C  Multiple Vitamin (MULTIVITAMIN WITH MINERALS) TABS tablet Take 1 tablet by mouth daily. 10/25/18  Yes Irene Pap N, DO  potassium chloride SA (K-DUR) 20 MEQ tablet Take 2 tablets (40 mEq total) by mouth daily. 05/01/19  Yes Larey Dresser, MD  spironolactone (ALDACTONE) 25 MG tablet Take 1 tablet (25 mg total) by mouth daily. 05/01/19  Yes Larey Dresser, MD  thiamine 100 MG tablet Take 1 tablet (100 mg total) by mouth daily. 10/25/18  Yes Kayleen Memos, DO  torsemide (DEMADEX) 20 MG tablet Take 2 tablets (40 mg total) by mouth 2 (two) times daily. 01/30/19  Yes Shirley Friar, PA-C  ibuprofen (ADVIL) 600 MG tablet Take 1 tablet (600 mg total) by mouth every 6 (six) hours as needed. 03/25/19   Charlann Lange, PA-C     Allergies:     Allergies  Allergen Reactions  . Gabapentin Nausea And Vomiting and Other (See Comments)    POSSIBLE SHAKING? TAKING MED CURRENTLY  . Lyrica [Pregabalin] Other (See Comments)    Shaking Pt still taking lyrica--"probably will stop taking"      Physical Exam:   Vitals  Blood pressure (!) 116/95, pulse 75, temperature 98.2 F (36.8 C), temperature source Oral, resp. rate 16, height 5\' 4"  (1.626 m), weight 104.3 kg, last menstrual period 11/27/2012, SpO2 97 %.    General well-developed female, laying in bed in no apparent distress Neuro No F.N deficits, ALL C.Nerves Intact, Strength 5/5 all 4 extremities, Sensation intact all 4 extremities, Plantars down going. HEENT Ears and Eyes appear Normal, Conjunctivae clear, PERRLA. Moist Oral Mucosa. Neck No JVD, No cervical lymphadenopathy appriciated, No Carotid Bruits. Chest Symmetrical Chest wall movement, Good air movement bilaterally, CTAB. Card RRR, No Gallops, Rubs or Murmurs, No Parasternal Heave. GI Positive Bowel Sounds, Abdomen Soft, No tenderness, No organomegaly appriciated,No rebound guarding or rigidity. MSK/Extremity RUE hand 2nd/3rd PIP joint minimally enlarged/edematous. Left foot bandage clean/dry/intact without overt bleeing or erythema.    Data Review:    CBC Recent Labs  Lab 06/28/19 1145 06/28/19 2027 06/29/19 0259 06/30/19 [id number]  07/01/19 0410  WBC 15.8* 19.7* 17.7* 18.7* 14.6*  HGB 8.3* 8.1* 8.0* 8.2* 7.3*  HCT 24.9* 24.2* 23.9* 25.2* 22.4*  PLT 246 273 244 313 280  MCV 91.5 90.3 89.8 94.4 91.8  MCH 30.5 30.2 30.1 30.7 29.9  MCHC 33.3 33.5 33.5 32.5 32.6  RDW 13.3 13.8 13.9 13.6 13.4   ------------------------------------------------------------------------------------------------------------------  Chemistries  Recent Labs  Lab 06/27/19 0341  06/28/19 0353 06/28/19 1135 06/28/19 2027 06/29/19 0259 06/29/19 1620 06/30/19 0705 07/01/19 0410  NA 134*  --  131* 132*  --  132*  --  134* 137  K 4.6  --  4.0 4.0  --  4.8  --  3.7 3.7  CL 92*  --  92*  --   --  95*  --  93* 98  CO2 27  --  27  --   --  25  --  28 29  GLUCOSE 254*  --  302* 182*  --  394*  --  227* 152*  BUN 71*  --  92*  --   --  86*  --  77* 79*  CREATININE 1.90*   < > 1.82*  --  1.66* 1.63* 1.43* 1.39* 1.48*  CALCIUM 8.7*  --  8.5*  --   --  8.2*  --  8.5* 8.0*   < > = values in this interval not displayed.    ------------------------------------------------------------------------------------------------------------------ estimated creatinine clearance is 49.9 mL/min (A) (by C-G formula based on SCr of 1.48 mg/dL (H)). ---------------------------------------------------------------------------------------------------------------  Urinalysis    Component Value Date/Time   COLORURINE YELLOW 03/18/2019 [place] 03/18/2019 1540   LABSPEC 1.013 03/18/2019 1540   PHURINE 5.0 03/18/2019 1540   GLUCOSEU NEGATIVE 03/18/2019 1540   HGBUR NEGATIVE 03/18/2019 [place] 03/18/2019 1540   KETONESUR NEGATIVE 03/18/2019 1540   PROTEINUR NEGATIVE 03/18/2019 1540   UROBILINOGEN 0.2 09/05/2015 0012   NITRITE NEGATIVE 03/18/2019 1540   LEUKOCYTESUR NEGATIVE 03/18/2019 [place] DO on 07/01/2019 at 7:32 AM  Between 7am to 7pm - Pager - [phone]. After 7pm go to [email or web address] - password [facility]  [facility] - Office  [phone]

## 2019-07-01 NOTE — Progress Notes (Addendum)
[facility] [place]  Subjective  - Doing well, still having difficulty walking.   Objective (!) 116/95 75 98.2 F (36.8 C) (Oral) 16 97%  Intake/Output Summary (Last 24 hours) at 07/01/2019 0721 Last data filed at 07/01/2019 0341 Gross per 24 hour  Intake 940 ml  Output 1610 ml  Net -670 ml   Graft pulse palpable, doppler signal AT/DP intact Left groin healing well, 4 x 4 placed over groin incision Left LE clean dry dressing changed by RN this am. Left 5th toe amp site healing well dry dressing placed over incision Lungs non labored breathing   Assessment/Planning: S/p Left femoral to anterior tibial bypass with non-reversed great saphenous vein and left fifth toe ray amputation  She is having some difficulty with ambulation and thinks she may ready for discharge tomorrow. Decreased leukocytosis from 18 to 14 currently on Vanco and Augmentin No sign of infection at wound sites.  Afebrile.  Cr 1.48 without significant change from baseline. PT HH and 3 in 1 ordered.  Roxy Horseman 07/01/2019 7:21 AM --  Laboratory Lab Results: Recent Labs    06/30/19 0705 07/01/19 0410  WBC 18.7* 14.6*  HGB 8.2* 7.3*  HCT 25.2* 22.4*  PLT 313 280   BMET Recent Labs    06/30/19 0705 07/01/19 0410  NA 134* 137  K 3.7 3.7  CL 93* 98  CO2 28 29  GLUCOSE 227* 152*  BUN 77* 79*  CREATININE 1.39* 1.48*  CALCIUM 8.5* 8.0*    COAG Lab Results  Component Value Date   INR 1.1 06/28/2019   INR 1.0 03/28/2018   INR 1.21 04/28/2017   No results found for: PTT  I have examined the patient, reviewed and agree with above.  Main complaint is of pain in her medial thigh.  Did have some extravasation and hematoma there from attempted SFA recanalization.  Vein harvest site looks good.  Easily palpable lateral femoral to anterior tibial graft pulse.  Continue to mobilize  Curt Jews, MD 07/01/2019 12:14 PM

## 2019-07-01 NOTE — Progress Notes (Addendum)
Spoke with a woman who identified herself as a Pt's daughter,named Lobbyist. She asked for Pt update infarmation but she was unable to answer Pt's date of bitrth and the reason for this admission. Based on HIPAA compliant, I redirected her to communicate with family member who shows on the system as Pt's contact person or call Pt directly if she knows Pt's phone number. She agreed to call Pt's directly in the morning.   Pt was sleeping while Wells Guiles called. I informed Pt when she woke up about her daughter.   Pt slept well to night on and off.  Her surgical wound on left groin and thing had serous drainage, cleaned and put on gauze dry dressing. Lower bilateral legs inflamed,redness and painful, pain tolerated fairly. Alternated oral pain med with Narco and Percocet. Her vital signs remained stable. No acute distress noted. Continue to monitor.  Kennyth Lose, RN

## 2019-07-02 LAB — TYPE AND SCREEN
ABO/RH(D): A POS
Antibody Screen: NEGATIVE
Unit division: 0
Unit division: 0
Unit division: 0
Unit division: 0

## 2019-07-02 LAB — GLUCOSE, CAPILLARY
Glucose-Capillary: 119 mg/dL — ABNORMAL HIGH (ref 70–99)
Glucose-Capillary: 249 mg/dL — ABNORMAL HIGH (ref 70–99)
Glucose-Capillary: 272 mg/dL — ABNORMAL HIGH (ref 70–99)
Glucose-Capillary: 183 mg/dL — ABNORMAL HIGH (ref 70–99)

## 2019-07-02 LAB — BASIC METABOLIC PANEL
Anion gap: 12 (ref 5–15)
BUN: 70 mg/dL — ABNORMAL HIGH (ref 6–20)
CO2: 29 mmol/L (ref 22–32)
Calcium: 8.4 mg/dL — ABNORMAL LOW (ref 8.9–10.3)
Chloride: 94 mmol/L — ABNORMAL LOW (ref 98–111)
Creatinine, Ser: 1.36 mg/dL — ABNORMAL HIGH (ref 0.44–1.00)
GFR calc Af Amer: 50 mL/min — ABNORMAL LOW (ref >60)
GFR calc non Af Amer: 43 mL/min — ABNORMAL LOW (ref >60)
Glucose, Bld: 196 mg/dL — ABNORMAL HIGH (ref 70–99)
Potassium: 3.4 mmol/L — ABNORMAL LOW (ref 3.5–5.1)
Sodium: 135 mmol/L (ref 135–145)

## 2019-07-02 LAB — BPAM RBC
Blood Product Expiration Date: 202009062359
Blood Product Expiration Date: 202009082359
Blood Product Expiration Date: 202009082359
Blood Product Expiration Date: 202009082359
ISSUE DATE / TIME: 202008140901
ISSUE DATE / TIME: 202008140901
Unit Type and Rh: 6200
Unit Type and Rh: 6200
Unit Type and Rh: 6200
Unit Type and Rh: 6200

## 2019-07-02 LAB — CBC
HCT: 22.9 % — ABNORMAL LOW (ref 36.0–46.0)
Hemoglobin: 7.4 g/dL — ABNORMAL LOW (ref 12.0–15.0)
MCH: 29.7 pg (ref 26.0–34.0)
MCHC: 32.3 g/dL (ref 30.0–36.0)
MCV: 92 fL (ref 80.0–100.0)
Platelets: 331 10*3/uL (ref 150–400)
RBC: 2.49 MIL/uL — ABNORMAL LOW (ref 3.87–5.11)
RDW: 13.4 % (ref 11.5–15.5)
WBC: 16.9 10*3/uL — ABNORMAL HIGH (ref 4.0–10.5)
nRBC: 0.1 % (ref 0.0–0.2)

## 2019-07-02 LAB — APTT: aPTT: 37 seconds — ABNORMAL HIGH (ref 24–36)

## 2019-07-02 NOTE — Progress Notes (Addendum)
[facility] [place]  Subjective  - Multiple body aches right hip, left shoulder, back....   Objective (!) 153/62 (!) 44 98.6 F (37 C) (Oral) 18 96%  Intake/Output Summary (Last 24 hours) at 07/02/2019 0725 Last data filed at 07/01/2019 1800 Gross per 24 hour  Intake 800 ml  Output 2 ml  Net 798 ml    Left lower leg erythema-chronic cellulitis.  SS drainage medial lower leg incision.  5th toe dressing clean and dry.  Doppler graft signal brisk lateral left leg, AT doppler brisk.   Groin incision intact, dry 4 x 4 placed over groin incision. Lungs non labored breathing  Assessment/Planning: POD # 4 Left femoral to anterior tibial bypass with non-reversed great saphenous vein and left fifth toe ray amputation  Pending CIR possible at discharge.   Patent bypass with brisk doppler, SS drainage medial thigh and medial lower leg incisions at risk of wound breakdown.  + 700 in fluid with mild edema left LE. Chronic cellulitis left LE WBC with increased leukocytosis now 16.9.  Currently on Augmentin and Vacomycin.   HGB stable 7.4 asymptomatic.  Roxy Horseman 07/02/2019 7:25 AM --  Laboratory Lab Results: Recent Labs    07/01/19 0410 07/02/19 0304  WBC 14.6* 16.9*  HGB 7.3* 7.4*  HCT 22.4* 22.9*  PLT 280 331   BMET Recent Labs    07/01/19 0410 07/02/19 0304  NA 137 135  K 3.7 3.4*  CL 98 94*  CO2 29 29  GLUCOSE 152* 196*  BUN 79* 70*  CREATININE 1.48* 1.36*  CALCIUM 8.0* 8.4*    COAG Lab Results  Component Value Date   INR 1.1 06/28/2019   INR 1.0 03/28/2018   INR 1.21 04/28/2017   No results found for: PTT  I have examined the patient, reviewed and agree with above.  Reports pain everywhere.  Neck back shoulders hip.  Easily palpable lateral femoral to anterior tibial bypass.  Fifth toe amputation site healing.  Stable from vascular standpoint  Curt Jews, MD 07/02/2019 7:41 AM

## 2019-07-02 NOTE — Progress Notes (Signed)
Progress Note   Patient Demographics:    [name], is a 58 y.o. female  MRN: [id number]   DOB - 01/02/1961  Admit Date - 06/21/2019  Outpatient Primary MD for the patient is Tsosie Billing, MD (Inactive)  Outpatient Specialists: Patient been followed by Dr. Alvester Chou in the past regarding PVD of bilateral lower extremity with multiple angiograms and stent placed, she did see vascular Dr. Trula Slade for carotid stenosis, she did see Dr. Sharol Given in the past for right transmetatarsal amputation.  Patient coming from: home   HPI:    [name]  is a 58 y.o. female, with past medical history of diabetes mellitus, tobacco abuse, still smoking, CAD, peripheral vascular disease s/p angiogram and stent by Dr. Gwenlyn Found, A. Fib on Eliquis, COPD, cirrhosis, OSA, CKD stage III, hypertension. She presents to ED secondary to nonhealing left fifth toe wounds, as well sent by her PCP for hyperkalemia, apparently patient had potassium of 5.9 yesterday at PCP office, she was asked to come to ED, in ED potassium was 5, she is on Aldactone, losartan and potassium supplement, as well she was noted to have left fifth toe nonhealing ulcer, with foul-smelling odor, report is been for last 3 weeks, she has not been given any antibiotics, denies fever, chills, shortness of breath. X-ray significant for osteomyelitis of left fifth toe, potassium of 5, and reddening of 2, white blood cell of 12,000, hospitalist called to admit.  Interim history:  Patient evaluated by vascular surgery given her poor vascular history and left foot wound 2/2 osteo, s/p left femoral to anterior tibial artery bypass graft on the left with fifth left toe amputation 06/28/2019 -tolerated procedure well, continues to have issues with ambulation.  Currently being evaluated by CIR for placement.  If unavailable to transition to CIR would consider discharge home with home  health, pending antibiotic de-escalation of and vascular concern for ongoing leukocytosis given recent surgery and possible wound involvement and ongoing vancomycin.   Subjective    No acute issues or events overnight.  Pain well controlled.  Tolerating p.o. well, ambulating with some difficulty but states this is improving from previous. Right hand swelling and pain now resolved.  Otherwise declines chest pain, shortness of breath, nausea, vomiting, dark stool, diarrhea, constipation, headache, fevers, chills.   Assessment & Plan:  Principal Problem:   Osteomyelitis ([place]) Active Problems:   DM (diabetes mellitus), type 2 with peripheral vascular complications ([name])   Carotid arterial disease ([name])   Coronary artery disease   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   Chronic combined systolic and diastolic CHF (congestive heart failure) ([name])   COPD GOLD 0    CKD (chronic kidney disease), stage III ([name])   CHF (congestive heart failure) ([name])   Left fifth toe with infected diabetic ulcer and osteomyelitis, POA, resolving -Patient remains afebrile -Leukocytosis downtrending: 22.0> 18.0>15.8>19.7>17.7>18.7>14.6>16.9 -Blood cultures remain negative to date -continue to follow for intraoperative culture -Previously on vanc and rocephin preoperatively - likely clean margins given op note - transition to po Augmentin to cover surrounding tissue. -Status post  left femoral to anterior tibial bypass with non-reversed great saphenous vein and left fifth toe ray amputation 06/28/2019  PAD/PVD -Vascular surgery initiated vancomycin post-operatively 8/16 - will follow along to ensure no wound issues or concern for localized infection - leukocytosis minimally up from yesterday but generally down-trending -Arteriogram showing occlusion of the left SFA and moderate popliteal disease -left femoral to anterior tibial bypass with non-reversed great saphenous vein -Continue apixaban -Continue with statin, plavix for  now  Ambulatory dysfunction, ongoing -In the setting of above -CIR following - possible need for placement prior to DC home given limited functionality/ambulation.  Acute on chronic anemia - likely b12 w/ mixed chronic anemia of chronic disease -Hgb previously 6.6 - s/p 2u PRBC to be transfused 8/14 -No overt signs/symptoms of bleeding noted in chart; patient indicates possibly darker stool than usual but indicates it is still brown - denies black/bloody/maroon BM -Hgb moderately stable - somewhat downtrending overnight but minimally  Diabetes mellitus, insulin dependent, poorly controlled -Likely uncontrolled in the setting of infection and stress as above  -Continue Lantus to 40 units twice daily  -Continue resistant sliding scale insulin -A1C 7.2 (06/21/19)  Chronic combined diastolic and systolic CHF, not in acute exacerbation -Echo 10/2018 with EF 45-50% (improved from40-45% 11/2016). -Appears to be euvolemic, continue with home dose torsemide -We will hold losartan, and Aldactone given previous hyperkalemia  Right hand swelling/pain, resolved -Likely 2/2 previously infiltrated IV -Pain and swelling improving daily  Hyperkalemia, mild, resolved -Hold Aldactone, losartan and potassium supplement -Potassium currently quite stable, pressure well controlled off these medications - potentially could consider discontinuing them at discharge pending vitals and potassium level over the next few days  CKD 3, stable - at baseline -Baseline appears to be around 2.2 per previous records -Remains near (if not better than) baseline at this point  Paroxysmal atrial fibrillation: -With normal sinus rhythm, continue with amiodarone and resume Eliquis as above -Unclear if patient needs chronic anticoagulation if paroxysmal afib and remains in sinus rhythm throughout stay - defer to outpatient cardiology for further eval/treatment.  Essential hypertension, well controlled - Continue  amlodipine/torsemide only - Holding losartan and aldactone as above with well controlled BP - will discontinue at discharge - Likely poor medication compliance at home given control while holding additional meds  Hyperlipidemia -Continue atorvastatin  Hypothyroidism: -Continue with levothyroxine  Tobacco abuse:  - Was counseled at length given above vascular disease, started on nicotine patch  DVT Prophylaxis Eliquis Code Status Full code Dispo: Pending CIR evaluation - possible need for ongoing IV abx with vascular surgery; home health service setup, DME, and tolerance of PO antibiotics Condition Guarded Consults called: Orthopedic sx, cardiology, vascular Sx Admission status: Inpatient, continues to require post op wound care and close monitoring; IV abx ongoing Time spent in minutes : >35 minutes   Home Medications:   Prior to Admission medications   Medication Sig Start Date End Date Taking? Authorizing Provider  acetaminophen (TYLENOL) 500 MG tablet Take 1,000 mg by mouth every 6 (six) hours as needed for mild pain.   Yes [provider]  albuterol (PROVENTIL HFA;VENTOLIN HFA) 108 (90 Base) MCG/ACT inhaler Inhale 2 puffs into the lungs every 6 (six) hours as needed for wheezing or shortness of breath. 08/09/17  Yes Medina-Vargas, Monina C, NP  amiodarone (PACERONE) 100 MG tablet TAKE 1 TABLET (100 MG TOTAL) BY MOUTH DAILY Patient taking differently: Take 100 mg by mouth daily.  09/20/18  Yes Larey Dresser, MD  amLODipine [facility])  10 MG tablet TAKE 1 TABLET (10 MG TOTAL) BY MOUTH DAILY. 05/01/19  Yes Larey Dresser, MD  apixaban (ELIQUIS) 5 MG TABS tablet Take 1 tablet (5 mg total) by mouth 2 (two) times daily. 08/09/17  Yes Medina-Vargas, Monina C, NP  Aspirin-Caffeine (BC FAST PAIN RELIEF PO) Take 1 Package by mouth as needed (headache).   Yes [provider]  atorvastatin (LIPITOR) 40 MG tablet TAKE 1 TABLET BY MOUTH ONCE EVERY EVENING Patient taking  differently: Take 40 mg by mouth daily. TAKE 1 TABLET BY MOUTH ONCE EVERY EVENING 08/09/17  Yes Medina-Vargas, Monina C, NP  bisoprolol (ZEBETA) 5 MG tablet Take 1 tablet (5 mg total) by mouth daily. 05/01/19  Yes Larey Dresser, MD  cholecalciferol (VITAMIN D) 1000 units tablet Take 1,000 Units by mouth daily.    Yes [provider]  clopidogrel (PLAVIX) 75 MG tablet Take 75 mg by mouth daily.   Yes [provider]  folic acid (FOLVITE) 1 MG tablet Take 1 tablet (1 mg total) by mouth daily. Patient taking differently: Take 1 mg by mouth daily.  10/25/18  Yes Kayleen Memos, DO  Insulin Glargine (LANTUS SOLOSTAR) 100 UNIT/ML Solostar Pen Inject 40 Units into the skin at bedtime.    Yes [provider]  Insulin Lispro (HUMALOG KWIKPEN) 200 UNIT/ML SOPN Inject 15 Units into the skin 3 (three) times daily with meals.    Yes [provider]  levothyroxine (SYNTHROID, LEVOTHROID) 50 MCG tablet Take 1 tablet (50 mcg total) by mouth daily. 08/09/17  Yes Medina-Vargas, Monina C, NP  losartan (COZAAR) 100 MG tablet TAKE 1 TABLET (100 MG TOTAL) BY MOUTH DAILY. 04/02/19  Yes Bensimhon, Shaune Pascal, MD  metolazone (ZAROXOLYN) 2.5 MG tablet Take 1 tablet (2.5 mg total) by mouth once a week. On Thursdays 01/30/19  Yes Tillery, Satira Mccallum, PA-C  Multiple Vitamin (MULTIVITAMIN WITH MINERALS) TABS tablet Take 1 tablet by mouth daily. 10/25/18  Yes Irene Pap N, DO  potassium chloride SA (K-DUR) 20 MEQ tablet Take 2 tablets (40 mEq total) by mouth daily. 05/01/19  Yes Larey Dresser, MD  spironolactone (ALDACTONE) 25 MG tablet Take 1 tablet (25 mg total) by mouth daily. 05/01/19  Yes Larey Dresser, MD  thiamine 100 MG tablet Take 1 tablet (100 mg total) by mouth daily. 10/25/18  Yes Kayleen Memos, DO  torsemide (DEMADEX) 20 MG tablet Take 2 tablets (40 mg total) by mouth 2 (two) times daily. 01/30/19  Yes Shirley Friar, PA-C  ibuprofen (ADVIL) 600 MG tablet Take 1  tablet (600 mg total) by mouth every 6 (six) hours as needed. 03/25/19   Charlann Lange, PA-C     Allergies:     Allergies  Allergen Reactions   Gabapentin Nausea And Vomiting and Other (See Comments)    POSSIBLE SHAKING? TAKING MED CURRENTLY   Lyrica [Pregabalin] Other (See Comments)    Shaking Pt still taking lyrica--"probably will stop taking"      Physical Exam:   Vitals  Blood pressure (!) 109/96, pulse 82, temperature 98.5 F (36.9 C), temperature source Oral, resp. rate 18, height 5\' 4"  (1.626 m), weight 104.2 kg, last menstrual period 11/27/2012, SpO2 100 %.   General well-developed female, laying in bed in no apparent distress Neuro No F.N deficits, ALL C.Nerves Intact, Strength 5/5 all 4 extremities, Sensation intact all 4 extremities. HEENT Ears and Eyes appear Normal, Conjunctivae clear, PERRLA. Moist Oral Mucosa. Neck No JVD, No cervical lymphadenopathy appriciated,  No Carotid Bruits. Chest Symmetrical Chest wall movement, Good air movement bilaterally, CTAB. Card RRR, No Gallops, Rubs or Murmurs, No Parasternal Heave. GI Positive Bowel Sounds, Abdomen Soft, No tenderness, No organomegaly appriciated,No rebound guarding or rigidity. MSK/Extremity RUE hand 2nd/3rd PIP joint minimally edematous. Left foot bandage clean/dry/intact without overt bleeing or erythema. R foot s/p transmetatarsal resection.    Data Review:    CBC Recent Labs  Lab 06/28/19 2027 06/29/19 0259 06/30/19 0705 07/01/19 0410 07/02/19 0304  WBC 19.7* 17.7* 18.7* 14.6* 16.9*  HGB 8.1* 8.0* 8.2* 7.3* 7.4*  HCT 24.2* 23.9* 25.2* 22.4* 22.9*  PLT 273 244 313 280 331  MCV 90.3 89.8 94.4 91.8 92.0  MCH 30.2 30.1 30.7 29.9 29.7  MCHC 33.5 33.5 32.5 32.6 32.3  RDW 13.8 13.9 13.6 13.4 13.4   ------------------------------------------------------------------------------------------------------------------  Chemistries  Recent Labs  Lab 06/28/19 0353 06/28/19 1135  06/29/19 0259  06/29/19 1620 06/30/19 0705 07/01/19 0410 07/02/19 0304  NA 131* 132*  --  132*  --  134* 137 135  K 4.0 4.0  --  4.8  --  3.7 3.7 3.4*  CL 92*  --   --  95*  --  93* 98 94*  CO2 27  --   --  25  --  28 29 29   GLUCOSE 302* 182*  --  394*  --  227* 152* 196*  BUN 92*  --   --  86*  --  77* 79* 70*  CREATININE 1.82*  --    < > 1.63* 1.43* 1.39* 1.48* 1.36*  CALCIUM 8.5*  --   --  8.2*  --  8.5* 8.0* 8.4*   < > = values in this interval not displayed.   ------------------------------------------------------------------------------------------------------------------ estimated creatinine clearance is 54.3 mL/min (A) (by C-G formula based on SCr of 1.36 mg/dL (H)). ---------------------------------------------------------------------------------------------------------------  Urinalysis    Component Value Date/Time   COLORURINE YELLOW 03/18/2019 [place] 03/18/2019 1540   LABSPEC 1.013 03/18/2019 1540   PHURINE 5.0 03/18/2019 1540   GLUCOSEU NEGATIVE 03/18/2019 1540   HGBUR NEGATIVE 03/18/2019 [place] 03/18/2019 1540   KETONESUR NEGATIVE 03/18/2019 1540   PROTEINUR NEGATIVE 03/18/2019 1540   UROBILINOGEN 0.2 09/05/2015 0012   NITRITE NEGATIVE 03/18/2019 1540   LEUKOCYTESUR NEGATIVE 03/18/2019 [place] DO on 07/02/2019 at 11:52 AM  Between 7am to 7pm - Pager - [phone]. After 7pm go to [email or web address] - password [facility]  [facility] - Office  [phone]

## 2019-07-02 NOTE — Care Management Important Message (Signed)
Important Message  Patient Details  Name: [name] MRN: [id number] Date of Birth: [birth year]   Medicare Important Message Given:  Yes     Shelda Altes 07/02/2019, 12:29 PM

## 2019-07-03 ENCOUNTER — Telehealth (HOSPITAL_COMMUNITY): Payer: Self-pay

## 2019-07-03 LAB — CBC
HCT: 26.1 % — ABNORMAL LOW (ref 36.0–46.0)
Hemoglobin: 8.3 g/dL — ABNORMAL LOW (ref 12.0–15.0)
MCH: 29.9 pg (ref 26.0–34.0)
MCHC: 31.8 g/dL (ref 30.0–36.0)
MCV: 93.9 fL (ref 80.0–100.0)
Platelets: 431 10*3/uL — ABNORMAL HIGH (ref 150–400)
RBC: 2.78 MIL/uL — ABNORMAL LOW (ref 3.87–5.11)
RDW: 13.5 % (ref 11.5–15.5)
WBC: 14.9 10*3/uL — ABNORMAL HIGH (ref 4.0–10.5)
nRBC: 0 % (ref 0.0–0.2)

## 2019-07-03 LAB — BASIC METABOLIC PANEL
Anion gap: 12 (ref 5–15)
BUN: 77 mg/dL — ABNORMAL HIGH (ref 6–20)
CO2: 27 mmol/L (ref 22–32)
Calcium: 8.4 mg/dL — ABNORMAL LOW (ref 8.9–10.3)
Chloride: 94 mmol/L — ABNORMAL LOW (ref 98–111)
Creatinine, Ser: 1.59 mg/dL — ABNORMAL HIGH (ref 0.44–1.00)
GFR calc Af Amer: 42 mL/min — ABNORMAL LOW (ref >60)
GFR calc non Af Amer: 36 mL/min — ABNORMAL LOW (ref >60)
Glucose, Bld: 159 mg/dL — ABNORMAL HIGH (ref 70–99)
Potassium: 3.9 mmol/L (ref 3.5–5.1)
Sodium: 133 mmol/L — ABNORMAL LOW (ref 135–145)

## 2019-07-03 LAB — GLUCOSE, CAPILLARY
Glucose-Capillary: 164 mg/dL — ABNORMAL HIGH (ref 70–99)
Glucose-Capillary: 260 mg/dL — ABNORMAL HIGH (ref 70–99)
Glucose-Capillary: 281 mg/dL — ABNORMAL HIGH (ref 70–99)
Glucose-Capillary: 297 mg/dL — ABNORMAL HIGH (ref 70–99)

## 2019-07-03 NOTE — NC FL2 (Signed)
[place] LEVEL OF CARE SCREENING TOOL     IDENTIFICATION  Patient Name: [name] Birthdate: 1961/05/17 Sex: female Admission Date (Current Location): 06/21/2019  [facility] and [place] Number:  Herbalist and Address:  [facility], [place] [address] [place], [place] [address]      Provider Number: [id number]  Attending Physician Name and Address:  Desiree Hane, MD  Relative Name and Phone Number:  Burnis Medin- roommate- primary contact- [phone]    Current Level of Care: Hospital Recommended Level of Care: [place] Prior Approval Number:    Date Approved/Denied: 06/15/17 PASRR Number: [id number]  Discharge Plan: SNF    Current Diagnoses: Patient Active Problem List   Diagnosis Date Noted  . Osteomyelitis ([place]) 06/21/2019  . NICM (nonischemic cardiomyopathy) ([place]) 06/20/2019  . Non-healing ulcer ([place]) 06/20/2019  . Acute CHF (congestive heart failure) ([place]) 11/26/2018  . CHF (congestive heart failure) ([place]) 11/26/2018  . Acute respiratory failure ([place]) 10/21/2018  . AKI (acute kidney injury) ([place]) 08/18/2018  . Coagulation disorder ([place]) 08/09/2017  . Depression 07/21/2017  . At risk for adverse drug reaction 06/20/2017  . Peripheral neuropathy 06/20/2017  . Acute osteomyelitis of right foot ([place]) 06/13/2017  . S/P transmetatarsal amputation of foot, right ([place]) 06/05/2017  . 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 PAD (peripheral artery disease) ([place])   . SIRS (systemic inflammatory response syndrome) ([place]) 04/06/2017  . CKD (chronic kidney disease), stage III ([place]) 11/24/2016  . Suspected sleep apnea 11/24/2016  . Ulcer of toe of right foot, with necrosis of bone ([place]) 10/27/2016  . Ulcer of left lower leg ([place]) 05/19/2016  . Severe obesity (BMI >= 40) ([place]) 02/24/2016  . COPD GOLD 0  02/24/2016  . Encounter for  therapeutic drug monitoring 02/10/2016  . Symptomatic bradycardia 01/12/2016  . Essential hypertension 12/22/2015  . Chronic combined systolic and diastolic CHF (congestive heart failure) ([place])   . Wheeze   . 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 Tobacco abuse 10/23/2015  . Coronary artery disease   . DOE (dyspnea on exertion) 04/29/2015  . Diabetes mellitus type 2, uncontrolled ([place]) 02/08/2015  . Influenza A 02/07/2015  . Wrist fracture, left, with routine healing, subsequent encounter 02/05/2015  . Wrist fracture, left, closed, initial encounter 01/29/2015  . PAF (paroxysmal atrial fibrillation) ([place]) 01/16/2015  . Carotid arterial disease ([place]) 01/16/2015  . Claudication ([place]) 01/15/2015  . Demand ischemia ([name]) 10/29/2014  . Insomnia 02/03/2014  . S/P peripheral artery angioplasty - [place]bo[place] atherectomy; R SFA 09/11/2013    Class: Acute  . Leg pain, bilateral 08/19/2013  . Hypothyroidism 07/31/2013  . Cellulitis 06/13/2013  . History of cocaine abuse ([place]) 06/13/2013  . Long term current use of anticoagulant therapy 05/20/2013  . Alcohol abuse   . Narcotic abuse ([place])   . Marijuana abuse   . Alcoholic cirrhosis ([place])   . DM (diabetes mellitus), type 2 with peripheral vascular complications ([name])     Orientation RESPIRATION BLADDER Height & Weight     Self, Time, Situation, Place  Normal Continent Weight: 103.4 kg Height:  5\' 4"  (162.6 cm)  BEHAVIORAL SYMPTOMS/MOOD NEUROLOGICAL BOWEL NUTRITION STATUS      Continent Diet(Carb modified)  AMBULATORY STATUS COMMUNICATION OF NEEDS Skin   Limited Assist Verbally Surgical wounds  Personal Care Assistance Level of Assistance  Bathing, Dressing Bathing Assistance: Limited assistance   Dressing Assistance: Limited assistance     Functional Limitations Info             SPECIAL CARE FACTORS FREQUENCY  PT (By licensed PT), OT (By licensed OT)     PT Frequency: 5x week OT Frequency: 5x  week            Contractures Contractures Info: Not present    Additional Factors Info  Allergies, Isolation Precautions, Insulin Sliding Scale   Allergies Info: Lyrica- shaking (pt still takes), Gabapentin- shaking (pt still takes)   Insulin Sliding Scale Info: Meal time coverage, bedtime scale - see d/c summary Isolation Precautions Info: MRSA- surgical PCR +     Current Medications (07/03/2019):  This is the current hospital active medication list Current Facility-Administered Medications  Medication Dose Route Frequency Provider Last Rate Last Dose  . 0.9 %  sodium chloride infusion   Intravenous Continuous Dagoberto Ligas, PA-C 10 mL/hr at 06/28/19 2057    . 0.9 %  sodium chloride infusion  500 mL Intravenous Once PRN Dagoberto Ligas, PA-C      . acetaminophen (TYLENOL) tablet 650 mg  650 mg Oral Q6H PRN Dagoberto Ligas, PA-C   650 mg at 06/29/19 [id number]   Or  . acetaminophen (TYLENOL) suppository 650 mg  650 mg Rectal Q6H PRN Dagoberto Ligas, PA-C      . amiodarone (PACERONE) tablet 100 mg  100 mg Oral Daily Dagoberto Ligas, PA-C   100 mg at 07/03/19 0933  . amLODipine (NORVASC) tablet 10 mg  10 mg Oral Daily Dagoberto Ligas, PA-C   10 mg at 07/03/19 [id number]  . amoxicillin-clavulanate (AUGMENTIN) 875-125 MG per tablet 1 tablet  1 tablet Oral Q12H [name], MD   1 tablet at 07/03/19 0932  . apixaban (ELIQUIS) tablet 5 mg  5 mg Oral BID [name], MD   5 mg at 07/03/19 0930  . atorvastatin (LIPITOR) tablet 40 mg  40 mg Oral q1800 Dagoberto Ligas, PA-C   40 mg at 07/02/19 1738  . bisoprolol (ZEBETA) tablet 5 mg  5 mg Oral Daily Dagoberto Ligas, PA-C   5 mg at 07/03/19 0929  . cholecalciferol (VITAMIN D3) tablet 1,000 Units  1,000 Units Oral Daily Dagoberto Ligas, PA-C   1,000 Units at 07/03/19 0931  . clopidogrel (PLAVIX) tablet 75 mg  75 mg Oral Daily Dagoberto Ligas, PA-C   75 mg at 07/03/19 0932  . docusate sodium (COLACE) capsule 100 mg  100 mg Oral  Daily Dagoberto Ligas, PA-C   100 mg at 07/03/19 0930  . fentaNYL (SUBLIMAZE) injection 25 mcg  25 mcg Intravenous Q1H PRN Dagoberto Ligas, PA-C   25 mcg at 07/01/19 1009  . folic acid (FOLVITE) tablet 1 mg  1 mg Oral Daily Dagoberto Ligas, PA-C   1 mg at 07/03/19 [id number]  . guaiFENesin-dextromethorphan (ROBITUSSIN DM) 100-10 MG/5ML syrup 15 mL  15 mL Oral Q4H PRN Dagoberto Ligas, PA-C      . hydrALAZINE (APRESOLINE) injection 5 mg  5 mg Intravenous Q20 Min PRN Dagoberto Ligas, PA-C      . HYDROcodone-acetaminophen (NORCO/VICODIN) 5-325 MG per tablet 1-2 tablet  1-2 tablet Oral Q4H PRN Dagoberto Ligas, PA-C   2 tablet at 07/03/19 0408  . insulin aspart (novoLOG) injection 0-20 Units  0-20 Units Subcutaneous TID WC Dagoberto Ligas, PA-C   11 Units at 07/03/19 1238  . insulin aspart (novoLOG) injection 0-5  Units  0-5 Units Subcutaneous QHS Dagoberto Ligas, PA-C   3 Units at 06/30/19 2205  . insulin glargine (LANTUS) injection 40 Units  40 Units Subcutaneous BID [name], MD   40 Units at 07/03/19 0931  . labetalol (NORMODYNE) injection 10 mg  10 mg Intravenous Q10 min PRN Dagoberto Ligas, PA-C      . levothyroxine (SYNTHROID) tablet 50 mcg  50 mcg Oral QAC breakfast Dagoberto Ligas, PA-C   50 mcg at 07/03/19 [id number]  . metoprolol tartrate (LOPRESSOR) injection 2-5 mg  2-5 mg Intravenous Q2H PRN Dagoberto Ligas, PA-C      . multivitamin with minerals tablet 1 tablet  1 tablet Oral Daily Dagoberto Ligas, PA-C   1 tablet at 07/03/19 0932  . nicotine (NICODERM CQ - dosed in mg/24 hours) patch 21 mg  21 mg Transdermal Daily Dagoberto Ligas, PA-C   21 mg at 07/03/19 0932  . oxyCODONE-acetaminophen (PERCOCET/ROXICET) 5-325 MG per tablet 1-2 tablet  1-2 tablet Oral Q4H PRN Dagoberto Ligas, PA-C   2 tablet at 07/03/19 1411  . pantoprazole (PROTONIX) EC tablet 40 mg  40 mg Oral Daily Dagoberto Ligas, PA-C   40 mg at 07/03/19 0930  . phenol (CHLORASEPTIC) mouth spray 1 spray  1 spray  Mouth/Throat PRN Dagoberto Ligas, PA-C      . sodium chloride flush (NS) 0.9 % injection 10-40 mL  10-40 mL Intracatheter Q12H [name], MD   10 mL at 07/03/19 1242  . sodium chloride flush (NS) 0.9 % injection 10-40 mL  10-40 mL Intracatheter PRN [name], MD      . thiamine (VITAMIN B-1) tablet 100 mg  100 mg Oral Daily Dagoberto Ligas, PA-C   100 mg at 07/03/19 0929  . torsemide (DEMADEX) tablet 40 mg  40 mg Oral BID Dagoberto Ligas, PA-C   40 mg at 07/03/19 0931  . traZODone (DESYREL) tablet 50 mg  50 mg Oral QHS PRN Omar Person, NP   50 mg at 06/29/19 2321  . vancomycin (VANCOCIN) IVPB 1000 mg/200 mL premix  1,000 mg Intravenous Q24H [name], MD 200 mL/hr at 07/03/19 1239 1,000 mg at 07/03/19 1239     Discharge Medications: Please see discharge summary for a list of discharge medications.  Relevant Imaging Results:  Relevant Lab Results:   Additional Information SS#- [id number], COVID test 06/21/19- negative  Daira Hine, Romeo Rabon, RN

## 2019-07-03 NOTE — Progress Notes (Signed)
Pharmacy Antibiotic Note  [name] is a 58 y.o. female on day # 4 Vancomycin, resumed on 8/16 due to leukocytosis; also on day #4 of 21 Augmentin.   POD#5 left fifth toe amputation and left femoral to anterior tibial bypass with non-reversed great saphenous vein d/t diabetic ulcer and osteomyelitis. Previously completed 11 days of vancomycin and ceftriaxone. Pharmacy was dosing vancomycin per unstable renal function.    Afebrile, WBC improving. Scr fluctuating, on Demadex 40 mg PO BID as PTA.  Plan:  Continue Vancomycin 1 gm IV q24hrs  Also on Augmentin 875 mg BID, begun on 8/16 and planned x 21 days.  Will check bmet in am for trend.  Stop Vancomycin soon?  Will check Vanc levels this week if Vanc to continue.  Height: 5\' 4"  (162.6 cm) Weight: 227 lb 14.4 oz (103.4 kg) IBW/kg (Calculated) : 54.7  Temp (24hrs), Avg:98 F (36.7 C), Min:97.9 F (36.6 C), Max:98.2 F (36.8 C)  Recent Labs  Lab 06/27/19 1748  06/28/19 2027 06/29/19 0259 06/29/19 1620 06/30/19 0705 07/01/19 0410 07/02/19 0304 07/03/19 0641  WBC  --    < > 19.7* 17.7*  --  18.7* 14.6* 16.9* 14.9*  CREATININE  --    < > 1.66* 1.63* 1.43* 1.39* 1.48* 1.36* 1.59*  VANCORANDOM 18  --  17  --   --   --   --   --   --    < > = values in this interval not displayed.    Estimated Creatinine Clearance: 46.3 mL/min (A) (by C-G formula based on SCr of 1.59 mg/dL (H)).    Allergies  Allergen Reactions  . Gabapentin Nausea And Vomiting and Other (See Comments)    POSSIBLE SHAKING? TAKING MED CURRENTLY  . Lyrica [Pregabalin] Other (See Comments)    Shaking Pt still taking lyrica--"probably will stop taking"     Antimicrobials this admission:  Vancomycin 8/7 >>8/14; resumed 8/16 >> Ceftriaxone 8/7 >>8/15 Augmentin 8/16 >> (9/6)  CHG/Bactroban 8/8>>8/13  Dose adjustments this admission:  - variable vancomycin dosing with prior course  Microbiology results:  8/7 Blood x 2: negative  8/7 Surg PCR:  postivie  8/7 COVID: negative  Thank you for allowing pharmacy to be a part of this patient's care.  Arty Baumgartner, [place] Pager: [phone] or phone: [phone] 07/03/2019 3:16 PM

## 2019-07-03 NOTE — Progress Notes (Signed)
Physical Therapy Treatment Patient Details Name: [name] MRN: [id number] DOB: 13-Jun-1961 Today's Date: 07/03/2019    History of Present Illness Pt is a 58 y.o. 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 past medical history of diabetes mellitus, tobacco abuse, still smoking, CAD, peripheral vascular disease s/p angiogram and stent, A. Fib, COPD, cirrhosis, OSA, CKD stage III, hypertension    PT Comments    Patient received in recliner reports she needs to go to the bathroom. Patient requires min guard for sit to stand transfer. She is able to ambulate 20 feet with RW. Post op shoe on left, tennis shoe on right. She is quite anxious this visit, tearful, reporting pain in multiple areas. Patient is slightly impulsive with mobility, but may just be due to increased anxiety and pain. Patient will benefit from continued skilled PT to improve functional independence, safety, and pain management.        Follow Up Recommendations  SNF;CIR(if not CIR candidate, SNF)     Equipment Recommendations  None recommended by PT    Recommendations for Other Services       Precautions / Restrictions Precautions Precautions: Fall Precaution Comments: mod fall Restrictions Weight Bearing Restrictions: Yes LLE Weight Bearing: Weight bearing as tolerated Other Position/Activity Restrictions: post op shoe    Mobility  Bed Mobility               General bed mobility comments: in recliner  Transfers Overall transfer level: Needs assistance Equipment used: Rolling walker (2 wheeled)   Sit to Stand: Min guard Stand pivot transfers: Min guard          Ambulation/Gait Ambulation/Gait assistance: Min guard Gait Distance (Feet): 20 Feet Assistive device: Rolling walker (2 wheeled) Gait Pattern/deviations: Step-through pattern;Decreased step  length - right;Decreased step length - left;Wide base of support;Shuffle Gait velocity: decreased   General Gait Details: patient very anxious, tearful, limited by pain   Stairs             Wheelchair Mobility    Modified Rankin (Stroke Patients Only)       Balance Overall balance assessment: Needs assistance Sitting-balance support: Feet supported Sitting balance-Leahy Scale: Good     Standing balance support: Bilateral upper extremity supported;During functional activity Standing balance-Leahy Scale: Fair Standing balance comment: using RW to steady herself                            Cognition Arousal/Alertness: Awake/alert Behavior During Therapy: Anxious;WFL for tasks assessed/performed Overall Cognitive Status: Within Functional Limits for tasks assessed                                 General Comments: patient tearful this session, multiple reports of pain in various areas.      Exercises      General Comments        Pertinent Vitals/Pain Pain Assessment: 0-10 Pain Score: 8  Pain Location: r hip, neck, back, left foot, right hand Pain Descriptors / Indicators: Operative site guarding;Sore;Aching;Grimacing;Crying Pain Intervention(s): Monitored during session;Repositioned    Home Living                      Prior Function            PT  Goals (current goals can now be found in the care plan section) Acute Rehab PT Goals Patient Stated Goal: "go to rehab first instead of home" PT Goal Formulation: With patient Time For Goal Achievement: 07/13/19 Potential to Achieve Goals: Good Progress towards PT goals: Progressing toward goals    Frequency    Min 3X/week      PT Plan Discharge plan needs to be updated    Co-evaluation              AM-PAC PT "6 Clicks" Mobility   Outcome Measure  Help needed turning from your back to your side while in a flat bed without using bedrails?: A Little Help  needed moving from lying on your back to sitting on the side of a flat bed without using bedrails?: A Little Help needed moving to and from a bed to a chair (including a wheelchair)?: A Little Help needed standing up from a chair using your arms (e.g., wheelchair or bedside chair)?: A Little Help needed to walk in hospital room?: A Little Help needed climbing 3-5 steps with a railing? : A Lot 6 Click Score: 17    End of Session Equipment Utilized During Treatment: Gait belt Activity Tolerance: Patient limited by fatigue;Patient limited by pain Patient left: in chair;with call bell/phone within reach Nurse Communication: Mobility status PT Visit Diagnosis: Unsteadiness on feet (R26.81);Muscle weakness (generalized) (M62.81);Difficulty in walking, not elsewhere classified (R26.2);Pain Pain - Right/Left: (bilateral various areas of body)     Time: [id number] PT Time Calculation (min) (ACUTE ONLY): 17 min  Charges:  $Gait Training: 8-22 mins                     Alexey Rhoads, PT, GCS 07/03/19,11:11 AM

## 2019-07-03 NOTE — Progress Notes (Addendum)
[facility]  Subjective  - Multiple body aches right shoulder, left hip and lumbar...   Objective 126/60 76 98 F (36.7 C) (Oral) 18 98%  Intake/Output Summary (Last 24 hours) at 07/03/2019 0820 Last data filed at 07/02/2019 1700 Gross per 24 hour  Intake 480 ml  Output 700 ml  Net -220 ml    Palpable left lateral bypass graft pulse Doppler AT/DP left LE brisk monophasic Left groin and leg incisions intact, SS drainage on old dressing, left open to air today. Lungs non labored breathing  Assessment/Planning: POD # 5 Left femoral to anterior tibial bypass with non-reversed great saphenous vein and left fifth toe ray amputation  Pending D/C possible CIR Patent bypass Dry dressing PRN for SS drainage at incisions.  Left open to air this am.  Dry 4 x 4 to left groin change PRN. WBC 14.9 on antibiotics vanco and Augmentin Stable from vascular point of view.  Roxy Horseman 07/03/2019 8:20 AM --  Laboratory Lab Results: Recent Labs    07/02/19 0304 07/03/19 0641  WBC 16.9* 14.9*  HGB 7.4* 8.3*  HCT 22.9* 26.1*  PLT 331 431*   BMET Recent Labs    07/02/19 0304 07/03/19 0641  NA 135 133*  K 3.4* 3.9  CL 94* 94*  CO2 29 27  GLUCOSE 196* 159*  BUN 70* 77*  CREATININE 1.36* 1.59*  CALCIUM 8.4* 8.4*    COAG Lab Results  Component Value Date   INR 1.1 06/28/2019   INR 1.0 03/28/2018   INR 1.21 04/28/2017   No results found for: PTT  I have examined the patient, reviewed and agree with above.  Extensive bilateral lower extremity edema.  Some edema fluid weeping through the surgical incisions in the left calf.  Easily palpable femoral to anterior tibial graft pulse.  Fifth toe amputation site healing without difficulty.  Curt Jews, MD 07/03/2019 4:37 PM

## 2019-07-03 NOTE — Progress Notes (Signed)
Occupational Therapy Treatment Patient Details Name: [name] MRN: [id number] DOB: [birth year] Today's Date: 07/03/2019    History of present illness Pt is a 58 y.o. 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 past medical history of diabetes mellitus, tobacco abuse, still smoking, CAD, peripheral vascular disease s/p angiogram and stent, A. Fib, COPD, cirrhosis, OSA, CKD stage III, hypertension   OT comments  Pt ambulated to bathroom with RW and min guard assist. Stood at sink for one standing grooming activity. Pt reporting pain in various joints. RN made aware. Pt needing total assist to don L post op shoe. Updated d/c from CIR to SNF. Will continue to follow.  Follow Up Recommendations  SNF;Supervision/Assistance - 24 hour    Equipment Recommendations  3 in 1 bedside commode    Recommendations for Other Services      Precautions / Restrictions Precautions Precautions: Fall Precaution Comments: weeping L LE incision Restrictions LLE Weight Bearing: Weight bearing as tolerated Other Position/Activity Restrictions: post op shoe       Mobility Bed Mobility               General bed mobility comments: in recliner  Transfers Overall transfer level: Needs assistance Equipment used: Rolling walker (2 wheeled) Transfers: Sit to/from Stand Sit to Stand: Min assist         General transfer comment: unable to use R UE to assist with sit>stand, min assist to rise and steady    Balance Overall balance assessment: Needs assistance   Sitting balance-Leahy Scale: Good       Standing balance-Leahy Scale: Poor Standing balance comment: can release walker in static standing, but reliant for ambulation                           ADL either performed or assessed with clinical judgement   ADL Overall ADL's : Needs  assistance/impaired     Grooming: Wash/dry hands;Standing;Min guard           Upper Body Dressing : Set up;Sitting       Toilet Transfer: Min guard;Ambulation;RW;Comfort height toilet;Grab bars   Toileting- Clothing Manipulation and Hygiene: Maximal assistance;Sit to/from stand       Functional mobility during ADLs: Passenger transport manager     Praxis      Cognition Arousal/Alertness: Awake/alert Behavior During Therapy: WFL for tasks assessed/performed Overall Cognitive Status: Within Functional Limits for tasks assessed                                          Exercises     Shoulder Instructions       General Comments      Pertinent Vitals/ Pain       Pain Assessment: Faces Faces Pain Scale: Hurts whole lot Pain Location: L hip, back, R hand Pain Descriptors / Indicators: Operative site guarding;Sore;Aching;Grimacing;Crying Pain Intervention(s): Monitored during session;Repositioned;Patient requesting pain meds-RN notified  Home Living  Prior Functioning/Environment              Frequency  Min 2X/week        Progress Toward Goals  OT Goals(current goals can now be found in the care plan section)  Progress towards OT goals: Progressing toward goals  Acute Rehab OT Goals Patient Stated Goal: "go to rehab first instead of home" OT Goal Formulation: With patient Time For Goal Achievement: 07/07/19 Potential to Achieve Goals: Good  Plan Discharge plan needs to be updated    Co-evaluation                 AM-PAC OT "6 Clicks" Daily Activity     Outcome Measure   Help from another person eating meals?: None Help from another person taking care of personal grooming?: A Little Help from another person toileting, which includes using toliet, bedpan, or urinal?: A Lot Help from another person bathing (including washing,  rinsing, drying)?: A Lot Help from another person to put on and taking off regular upper body clothing?: A Little Help from another person to put on and taking off regular lower body clothing?: A Lot 6 Click Score: 16    End of Session Equipment Utilized During Treatment: Gait belt;Rolling walker  OT Visit Diagnosis: Pain;Other abnormalities of gait and mobility (R26.89);Muscle weakness (generalized) (M62.81)   Activity Tolerance Patient tolerated treatment well   Patient Left in chair;with call bell/phone within reach;with chair alarm set;with family/visitor present   Nurse Communication Patient requests pain meds(IV alarming)        Time: [id number] OT Time Calculation (min): 23 min  Charges: OT General Charges $OT Visit: 1 Visit OT Treatments $Self Care/Home Management : 23-37 mins  Nestor Lewandowsky, OTR/L Acute Rehabilitation Services Pager: [phone] Office: [phone]   [name] 07/03/2019, 3:06 PM

## 2019-07-03 NOTE — Progress Notes (Signed)
[facility]  PROGRESS NOTE  [name] [id number] DOB: November 22, 1960 DOA: 06/21/2019 PCP: Tsosie Billing, MD (Inactive)  Brief History    [name] is a 58 y.o. year old female with medical history significant for  diabetes mellitus, tobacco abuse, still smoking, CAD, peripheral vascular disease s/p angiogram and stent by Dr. Gwenlyn Found, A. Fib on Eliquis, COPD, cirrhosis, OSA, CKD stage III, hypertension who presented on 06/21/2019 after being directed to the ED by her PCP due to elevated potassium of 5.9, while on Aldactone, losartan and potassium supplementation as well as poorly healing left fifth toe ulcer with foul smell for at least 3 weeks and was found to have osteomyelitis of left fifth toe.  A & P   Osteomyelitis gangrenous of left fifth toe in known diabetic with poor healing related to PAD.  Status post left fifth toe ray amputation, blood cultures are unremarkable.  Vancomycin briefly discontinued but resumed due to worsening leukocytosis, now WBC stable and has defervesced, continue to monitor on oral Augmentin.  Critical limb ischemia with gangrenous left fifth toe, status post left femoral to anterior tibial bypass disease( 8/14), postoperative day 5.  Underwent attempted endovascular treatment on 8/12 that was unsuccessful prompting bypass.  Continue dressing changes as recommended by vascular.  CKD stage III, stable Baseline creatinine seems to fluctuate between 1.3-1.8.  Briefly elevated 2.16 on 8/13, currently stable within baseline.  Avoid nephrotoxins, monitor output..  Acute on chronic normocytic anemia.  Stable.  Baseline hemoglobin 8-9.  Nadir of 6.6 on this admission.  Currently doing okay at 8.3, transfused 2 maintain goal hemoglobin greater than 7  Paroxysmal atrial fibrillation, rate controlled.  Continue Eliquis and amiodarone.  Nonischemic cardiomyopathy with EF of 45%, stable.  Normal volume status, close monitor.  Type 2 diabetes on insulin,  monitor CBG, sliding scale as needed.  Hypertension, continue amlodipine/torsemide.  Losartan and Aldactone held due to well-controlled BP will discontinue on discharge.   DVT prophylaxis: eliquis Code Status: FULL Family Communication: sister updated at bedside Disposition Plan: PT recommends SNF- C/m arranging, insurance denied CIR> medically stable for discharge    [facility] Direct contact: see [email or web address] (further directions at bottom of note if needed) 7PM-7AM contact night coverage as at bottom of note 07/03/2019, 9:46 AM  LOS: 12 days   Consultants  . Cardiology, orthopedics, vascular  Procedures  . 8/14: Left femoral to anterior tibial bypass with non-reversed great saphenous vein and left fifth toe ray amputation  Antibiotics  . Vancomycin . Augmentin  Interval History/Subjective  Having neck pain from sitting in recliner. Noticing some draining on left leg and left toe area  Objective   Vitals:  Vitals:   07/03/19 0408 07/03/19 0807  BP: (!) 142/65 126/60  Pulse:    Resp: 18   Temp: 98.2 F (36.8 C) 98 F (36.7 C)  SpO2: 98%     Exam:  Awake Alert, Oriented X 3, No new F.N deficits, Normal affect [place].AT,PERRAL Supple Neck,No JVD Symmetrical Chest wall movement, Good air movement bilaterally, CTAB RRR,No Gallops,Rubs or new Murmurs, No Parasternal Heave +ve B.Sounds, Abd Soft, No tenderness, No organomegaly appriciated, No rebound - guarding or rigidity. Post surgical site healing with minimal drainage on left lateral foot. Vein graft sites healing well with serosanguinous drainage   I have personally reviewed the following:   Data Reviewed: Basic Metabolic Panel: Recent Labs  Lab 06/29/19 0259 06/29/19 1620 06/30/19 0705 07/01/19 0410 07/02/19 0304 07/03/19 0641  NA 132*  --  134* 137 135 133*  K 4.8  --  3.7 3.7 3.4* 3.9  CL 95*  --  93* 98 94* 94*  CO2 25  --  28 29 29 27   GLUCOSE 394*  --  227* 152* 196* 159*  BUN 86*  --  77*  79* 70* 77*  CREATININE 1.63* 1.43* 1.39* 1.48* 1.36* 1.59*  CALCIUM 8.2*  --  8.5* 8.0* 8.4* 8.4*   Liver Function Tests: No results for input(s): AST, ALT, ALKPHOS, BILITOT, PROT, ALBUMIN in the last 168 hours. No results for input(s): LIPASE, AMYLASE in the last 168 hours. No results for input(s): AMMONIA in the last 168 hours. CBC: Recent Labs  Lab 06/29/19 0259 06/30/19 0705 07/01/19 0410 07/02/19 0304 07/03/19 0641  WBC 17.7* 18.7* 14.6* 16.9* 14.9*  HGB 8.0* 8.2* 7.3* 7.4* 8.3*  HCT 23.9* 25.2* 22.4* 22.9* 26.1*  MCV 89.8 94.4 91.8 92.0 93.9  PLT 244 313 280 331 431*   Cardiac Enzymes: No results for input(s): CKTOTAL, CKMB, CKMBINDEX, TROPONINI in the last 168 hours. BNP (last 3 results) Recent Labs    10/18/18 1109 10/21/18 0125 11/27/18 0519  BNP 410.0* 548.1* 511.3*    ProBNP (last 3 results) No results for input(s): PROBNP in the last 8760 hours.  CBG: Recent Labs  Lab 07/02/19 0646 07/02/19 1100 07/02/19 1614 07/02/19 2112 07/03/19 0618  GLUCAP 119* 249* 272* 183* 164*    No results found for this or any previous visit (from the past 240 hour(s)).   Studies: No results found.  Scheduled Meds: . sodium chloride   Intravenous Once  . amiodarone  100 mg Oral Daily  . amLODipine  10 mg Oral Daily  . amoxicillin-clavulanate  1 tablet Oral Q12H  . apixaban  5 mg Oral BID  . atorvastatin  40 mg Oral q1800  . bisoprolol  5 mg Oral Daily  . cholecalciferol  1,000 Units Oral Daily  . clopidogrel  75 mg Oral Daily  . docusate sodium  100 mg Oral Daily  . folic acid  1 mg Oral Daily  . insulin aspart  0-20 Units Subcutaneous TID WC  . insulin aspart  0-5 Units Subcutaneous QHS  . insulin glargine  40 Units Subcutaneous BID  . levothyroxine  50 mcg Oral QAC breakfast  . multivitamin with minerals  1 tablet Oral Daily  . nicotine  21 mg Transdermal Daily  . pantoprazole  40 mg Oral Daily  . sodium chloride flush  10-40 mL Intracatheter Q12H  .  thiamine  100 mg Oral Daily  . torsemide  40 mg Oral BID  . traZODone  50 mg Oral Once   Continuous Infusions: . sodium chloride 10 mL/hr at 06/28/19 2057  . sodium chloride    . vancomycin 1,000 mg (07/02/19 1315)    Principal Problem:   Osteomyelitis ([place]) Active Problems:   DM (diabetes mellitus), type 2 with peripheral vascular complications ([name])   Carotid arterial disease ([name])   Coronary artery disease   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   Chronic combined systolic and diastolic CHF (congestive heart failure) ([name])   COPD GOLD 0    CKD (chronic kidney disease), stage III ([name])   CHF (congestive heart failure) ([place])      Desiree Hane  [facility]

## 2019-07-03 NOTE — Progress Notes (Signed)
Inpatient Rehab Admissions Coordinator:   Received insurance denial for CIR. Recommend pt f/u with home health therapies or SNF, if she does not feel she can d/c home safely.  I will let pt and CM know.   Shann Medal, PT, DPT Admissions Coordinator [phone] 07/03/19  9:59 AM

## 2019-07-03 NOTE — Telephone Encounter (Signed)
Pt was referred back to community paramedicine program, however she is still in hosp since referral and it appears she may be going [facility].  I will keep check on her status and will f/u once she is out.   Marylouise Stacks, EMT-Paramedic 07/03/19

## 2019-07-04 LAB — BASIC METABOLIC PANEL
Anion gap: 12 (ref 5–15)
BUN: 71 mg/dL — ABNORMAL HIGH (ref 6–20)
CO2: 27 mmol/L (ref 22–32)
Calcium: 8 mg/dL — ABNORMAL LOW (ref 8.9–10.3)
Chloride: 94 mmol/L — ABNORMAL LOW (ref 98–111)
Creatinine, Ser: 1.39 mg/dL — ABNORMAL HIGH (ref 0.44–1.00)
GFR calc Af Amer: 49 mL/min — ABNORMAL LOW (ref >60)
GFR calc non Af Amer: 42 mL/min — ABNORMAL LOW (ref >60)
Glucose, Bld: 173 mg/dL — ABNORMAL HIGH (ref 70–99)
Potassium: 3.7 mmol/L (ref 3.5–5.1)
Sodium: 133 mmol/L — ABNORMAL LOW (ref 135–145)

## 2019-07-04 LAB — GLUCOSE, CAPILLARY
Glucose-Capillary: 106 mg/dL — ABNORMAL HIGH (ref 70–99)
Glucose-Capillary: 313 mg/dL — ABNORMAL HIGH (ref 70–99)
Glucose-Capillary: 198 mg/dL — ABNORMAL HIGH (ref 70–99)
Glucose-Capillary: 265 mg/dL — ABNORMAL HIGH (ref 70–99)

## 2019-07-04 LAB — CBC
HCT: 25.2 % — ABNORMAL LOW (ref 36.0–46.0)
Hemoglobin: 7.9 g/dL — ABNORMAL LOW (ref 12.0–15.0)
MCH: 29.3 pg (ref 26.0–34.0)
MCHC: 31.3 g/dL (ref 30.0–36.0)
MCV: 93.3 fL (ref 80.0–100.0)
Platelets: 441 10*3/uL — ABNORMAL HIGH (ref 150–400)
RBC: 2.7 MIL/uL — ABNORMAL LOW (ref 3.87–5.11)
RDW: 13.4 % (ref 11.5–15.5)
WBC: 14.6 10*3/uL — ABNORMAL HIGH (ref 4.0–10.5)
nRBC: 0 % (ref 0.0–0.2)

## 2019-07-04 MED ORDER — DICLOFENAC SODIUM 1 % TD GEL
2.0000 g | Freq: Four times a day (QID) | TRANSDERMAL | Status: DC
  Administered 2019-07-04 – 2019-07-06 (×8): 2 g via TOPICAL
  Filled 2019-07-04: qty 100

## 2019-07-04 NOTE — TOC Initial Note (Signed)
Transition of Care [facility]) - Initial/Assessment Note    Patient Details  Name: [name] MRN: [id number] Date of Birth: 04-15-1961  Transition of Care [facility]) CM/SW Contact:    Vinie Sill, [place] Phone Number: 07/04/2019, 3:03 PM  Clinical Narrative:                  CSW received consult for discharge needs. CSW spoke with patient regarding PT recommendation of SNF placement at time of discharge. Patient states she lives with her cousin, [name]. Patient recognizes need for rehab before returning home and is agreeable to a SNF placement. Patient reported preference for [facility].   Patient is realistic regarding therapy needs and expressed being hopeful for SNF placement. Patient expressed understanding of CSW role and discharge process as well as medical condition. No questions/concerns about plan or treatment at this time.  [name], MSW, [facility] Clinical Social Worker [phone]   Expected Discharge Plan: Skilled Nursing Facility Barriers to Discharge: Continued Medical Work up   Patient Goals and CMS Choice Patient states their goals for this hospitalization and ongoing recovery are:: to get better and go home CMS Medicare.gov Compare Post Acute Care list provided to:: Patient Choice offered to / list presented to : Patient  Expected Discharge Plan and Services Expected Discharge Plan: [place] In-house Referral: Clinical Social Work Discharge Planning Services: CM Consult Post Acute Care Choice: Home Health, Durable Medical Equipment Living arrangements for the past 2 months: [place]                 DME Arranged: 3-N-1         HH Arranged: PT [place]: [facility] [employer] Date [place]: 06/30/19 Time [place]: 1009 Representative spoke with at [place]: Alwyn Ren  Prior Living Arrangements/Services Living arrangements for the past 2 months: [place] with::  Self, Roommate, Relatives          Need for Family Participation in Patient Care: Yes (Comment) Care giver support system in place?: Yes (comment)   Criminal Activity/Legal Involvement Pertinent to Current Situation/Hospitalization: No - Comment as needed  Activities of Daily Living Home Assistive Devices/Equipment: Environmental consultant (specify type), Wheelchair ADL Screening (condition at time of admission) Patient's cognitive ability adequate to safely complete daily activities?: Yes Is the patient deaf or have difficulty hearing?: No Does the patient have difficulty seeing, even when wearing glasses/contacts?: No Does the patient have difficulty concentrating, remembering, or making decisions?: No Patient able to express need for assistance with ADLs?: Yes Does the patient have difficulty dressing or bathing?: No Independently performs ADLs?: Yes (appropriate for developmental age) Does the patient have difficulty walking or climbing stairs?: Yes Weakness of Legs: Both Weakness of Arms/Hands: None  Permission Sought/Granted Permission sought to share information with : Family Supports Permission granted to share information with : Yes, Verbal Permission Granted  Share Information with NAME: [name]  Permission granted to share info w AGENCY: SNFs  Permission granted to share info w Relationship: cousin  Permission granted to share info w Contact Information: [phone]  Emotional Assessment Appearance:: Appears stated age Attitude/Demeanor/Rapport: Engaged Affect (typically observed): Accepting, Appropriate, Pleasant Orientation: : Oriented to Self, Oriented to Place, Oriented to  Time, Oriented to Situation Alcohol / Substance Use: Not Applicable Psych Involvement: No (comment)  Admission diagnosis:  Osteomyelitis of left foot, unspecified type [facility]) [M86.9] Patient Active Problem List   Diagnosis Date Noted  . Osteomyelitis (  [place]) 06/21/2019  . NICM (nonischemic  cardiomyopathy) ([place]) 06/20/2019  . Non-healing ulcer ([place]) 06/20/2019  . Acute CHF (congestive heart failure) ([place]) 11/26/2018  . CHF (congestive heart failure) ([place]) 11/26/2018  . Acute respiratory failure ([place]) 10/21/2018  . AKI (acute kidney injury) ([place]) 08/18/2018  . Coagulation disorder ([place]) 08/09/2017  . Depression 07/21/2017  . At risk for adverse drug reaction 06/20/2017  . Peripheral neuropathy 06/20/2017  . Acute osteomyelitis of right foot ([place]) 06/13/2017  . S/P transmetatarsal amputation of foot, right ([place]) 06/05/2017  . 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 PAD (peripheral artery disease) ([place])   . SIRS (systemic inflammatory response syndrome) ([place]) 04/06/2017  . CKD (chronic kidney disease), stage III ([place]) 11/24/2016  . Suspected sleep apnea 11/24/2016  . Ulcer of toe of right foot, with necrosis of bone ([place]) 10/27/2016  . Ulcer of left lower leg ([place]) 05/19/2016  . Severe obesity (BMI >= 40) ([place]) 02/24/2016  . COPD GOLD 0  02/24/2016  . Encounter for therapeutic drug monitoring 02/10/2016  . Symptomatic bradycardia 01/12/2016  . Essential hypertension 12/22/2015  . Chronic combined systolic and diastolic CHF (congestive heart failure) ([place])   . Wheeze   . 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 Tobacco abuse 10/23/2015  . Coronary artery disease   . DOE (dyspnea on exertion) 04/29/2015  . Diabetes mellitus type 2, uncontrolled ([place]) 02/08/2015  . Influenza A 02/07/2015  . Wrist fracture, left, with routine healing, subsequent encounter 02/05/2015  . Wrist fracture, left, closed, initial encounter 01/29/2015  . PAF (paroxysmal atrial fibrillation) ([place]) 01/16/2015  . Carotid arterial disease ([place]) 01/16/2015  . Claudication ([place]) 01/15/2015  . Demand ischemia ([place]) 10/29/2014  . Insomnia 02/03/2014  . S/P peripheral artery angioplasty - [place]bo[place] atherectomy; R SFA 09/11/2013     Class: Acute  . Leg pain, bilateral 08/19/2013  . Hypothyroidism 07/31/2013  . Cellulitis 06/13/2013  . History of cocaine abuse ([place]) 06/13/2013  . Long term current use of anticoagulant therapy 05/20/2013  . Alcohol abuse   . Narcotic abuse ([place])   . Marijuana abuse   . Alcoholic cirrhosis ([place])   . DM (diabetes mellitus), type 2 with peripheral vascular complications ([place])    PCP:  Tsosie Billing, MD (Inactive) Pharmacy:   [id number] [address], [place] - [place] [address] Phone: [phone] Fax: [phone]  [facility] [place], [place] [address] Phone: [phone] Fax: [place], [place] - [address] [place] [address] Phone: [phone] Fax: [phone]     Social Determinants of Health (SDOH) Interventions    Readmission Risk Interventions No flowsheet data found.

## 2019-07-04 NOTE — Progress Notes (Addendum)
[facility] [place]  Subjective  - maybe feeling a little better.   Objective (!) 120/59 62 98 F (36.7 C) (Oral) 20 99%  Intake/Output Summary (Last 24 hours) at 07/04/2019 0737 Last data filed at 07/04/2019 0639 Gross per 24 hour  Intake 250 ml  Output 1350 ml  Net -1100 ml   Incisions open to air, minimal drainage on seat pad from thigh incision. 5th toe amp healing well Mild cellulitis left LE Palpable left lateral bypass pulse Lungs non labored breathing    Assessment/Planning: POD # 6 Left femoral to anterior tibial bypass with non-reversed great saphenous vein and left fifth toe ray amputation  Patent bypass,CC still general body aches and pain. Insurance denial   CIR, pending SNF placement No new labs this am  Still on antibiotics  Stable from antibiotics    Roxy Horseman 07/04/2019 7:37 AM --  Laboratory Lab Results: Recent Labs    07/02/19 0304 07/03/19 0641  WBC 16.9* 14.9*  HGB 7.4* 8.3*  HCT 22.9* 26.1*  PLT 331 431*   BMET Recent Labs    07/03/19 0641 07/04/19 0300  NA 133* 133*  K 3.9 3.7  CL 94* 94*  CO2 27 27  GLUCOSE 159* 173*  BUN 77* 71*  CREATININE 1.59* 1.39*  CALCIUM 8.4* 8.0*    COAG Lab Results  Component Value Date   INR 1.1 06/28/2019   INR 1.0 03/28/2018   INR 1.21 04/28/2017   No results found for: PTT  I have examined the patient, reviewed and agree with above.  Fifth toe amputation healing quite nicely.  Easily palpable Pham anterior tibial bypass.  Skilled nursing facility.  Curt Jews, MD 07/04/2019 9:43 AM

## 2019-07-04 NOTE — TOC Progression Note (Signed)
Transition of Care [facility]) - Progression Note    Patient Details  Name: [name] MRN: [id number] Date of Birth: July 06, 1961  Transition of Care [facility]) CM/SW [place], [place] Phone Number: 07/04/2019, 5:36 PM  Clinical Narrative:     CSW provided patient with bed offers and medicare.gov Snf list for choice. [facility] wants [facility]- they accepted in the [place] will follow up [facility]  tomorrow for confirmation.  Patient will need Covid and Insurance authorization.  [name], MSW, [facility] Clinical Social Worker [phone]    Expected Discharge Plan: Skilled Nursing Facility Barriers to Discharge: Continued Medical Work up  Expected Discharge Plan and Services Expected Discharge Plan: [place] In-house Referral: Clinical Social Work Discharge Planning Services: CM Consult Post Acute Care Choice: Home Health, Durable Medical Equipment Living arrangements for the past 2 months: Single Family Home                 DME Arranged: 3-N-1         [place] Arranged: PT [place] Agency: [facility] [employer] Date [place]: 06/30/19 Time [place]: 1009 Representative spoke with at [place]: [place]) Interventions    Readmission Risk Interventions No flowsheet data found.

## 2019-07-04 NOTE — Progress Notes (Signed)
[facility]  PROGRESS NOTE  [name] [id number] DOB: 12-09-1960 DOA: 06/21/2019 PCP: Tsosie Billing, MD (Inactive)  Brief History    [name] is a 58 y.o. year old female with medical history significant for  diabetes mellitus, tobacco abuse, still smoking, CAD, peripheral vascular disease s/p angiogram and stent by Dr. Gwenlyn Found, A. Fib on Eliquis, COPD, cirrhosis, OSA, CKD stage III, hypertension who presented on 06/21/2019 after being directed to the ED by her PCP due to elevated potassium of 5.9, while on Aldactone, losartan and potassium supplementation as well as poorly healing left fifth toe ulcer with foul smell for at least 3 weeks and was found to have osteomyelitis of left fifth toe.  A & P   Osteomyelitis gangrenous of left fifth toe in known diabetic with poor healing related to PAD.  Status post left fifth toe ray amputation, blood cultures are unremarkable.  Vancomycin briefly discontinued but resumed due to worsening leukocytosis, of note patients nasal swab pos for MRSA on PCR, WBC remains stable and afebrile on current regimen with Augmentin.  Does have some slight erythema on anterior leg suspect is more related to poor drainage related to vein graft site removal, closely monitor  Critical limb ischemia with gangrenous left fifth toe, status post left femoral to anterior tibial bypass disease( 8/14), postoperative day 5.  Underwent attempted endovascular treatment on 8/12 that was unsuccessful prompting bypass.  Continue dressing changes as recommended by vascular.  CKD stage III, stable Baseline creatinine seems to fluctuate between 1.3-1.8.  Briefly elevated 2.16 on 8/13, currently stable within baseline.  Avoid nephrotoxins, monitor output..  Acute on chronic normocytic anemia.  Stable.  Baseline hemoglobin 8-9.  [place] of 6.6 on this admission.  Currently around 8, transfuse goal hemoglobin less than 7  Paroxysmal atrial fibrillation, rate controlled.   Continue Eliquis and amiodarone.  Nonischemic cardiomyopathy with EF of 45%, stable.  Normal volume status, close monitor.  Daily weights, holding home metolazone, torsemide, spironolactone, losartan  Type 2 diabetes with peripheral neuropathy on insulin, monitor CBG, sliding scale as needed.  Hypertension, continue amlodipine/torsemide.  Losartan and Aldactone held due to well-controlled BP will discontinue on discharge.   DVT prophylaxis: eliquis Code Status: FULL Family Communication: No family at bedside Disposition Plan: PT recommends SNF- C/m arranging, insurance denied CIR> medically stable for discharge    [facility] Direct contact: see [email or web address] (further directions at bottom of note if needed) 7PM-7AM contact night coverage as at bottom of note 07/04/2019, 8:22 AM  LOS: 13 days   Consultants  . Cardiology, orthopedics, vascular  Procedures  . 8/14: Left femoral to anterior tibial bypass with non-reversed great saphenous vein and left fifth toe ray amputation  Antibiotics  . Vancomycin . Augmentin  Interval History/Subjective  Complains of right wrist pain at site of previous IV.  No chest pain, no shortness of breath.  Objective   Vitals:  Vitals:   07/03/19 2005 07/04/19 0508  BP: 124/81 (!) 120/59  Pulse: 75 62  Resp: 19 20  Temp: 98.6 F (37 C) 98 F (36.7 C)  SpO2: 99% 99%    Exam:  Awake Alert, Oriented X 3, No new F.N deficits, Normal affect [place].AT,PERRAL Supple Neck,No JVD Symmetrical Chest wall movement, Good air movement bilaterally, CTAB RRR,No Gallops,Rubs or new Murmurs, No Parasternal Heave +ve B.Sounds, Abd Soft, No tenderness, No organomegaly appriciated, No rebound - guarding or rigidity. Post surgical site healing with minimal drainage on left lateral foot. Vein graft  sites healing well with serosanguinous drainage on anterior left leg with some surrounding erythema that blanches   I have personally reviewed the following:    Data Reviewed: Basic Metabolic Panel: Recent Labs  Lab 06/30/19 0705 07/01/19 0410 07/02/19 0304 07/03/19 0641 07/04/19 0300  NA 134* 137 135 133* 133*  K 3.7 3.7 3.4* 3.9 3.7  CL 93* 98 94* 94* 94*  CO2 28 29 29 27 27   GLUCOSE 227* 152* 196* 159* 173*  BUN 77* 79* 70* 77* 71*  CREATININE 1.39* 1.48* 1.36* 1.59* 1.39*  CALCIUM 8.5* 8.0* 8.4* 8.4* 8.0*   Liver Function Tests: No results for input(s): AST, ALT, ALKPHOS, BILITOT, PROT, ALBUMIN in the last 168 hours. No results for input(s): LIPASE, AMYLASE in the last 168 hours. No results for input(s): AMMONIA in the last 168 hours. CBC: Recent Labs  Lab 06/29/19 0259 06/30/19 0705 07/01/19 0410 07/02/19 0304 07/03/19 0641  WBC 17.7* 18.7* 14.6* 16.9* 14.9*  HGB 8.0* 8.2* 7.3* 7.4* 8.3*  HCT 23.9* 25.2* 22.4* 22.9* 26.1*  MCV 89.8 94.4 91.8 92.0 93.9  PLT 244 313 280 331 431*   Cardiac Enzymes: No results for input(s): CKTOTAL, CKMB, CKMBINDEX, TROPONINI in the last 168 hours. BNP (last 3 results) Recent Labs    10/18/18 1109 10/21/18 0125 11/27/18 0519  BNP 410.0* 548.1* 511.3*    ProBNP (last 3 results) No results for input(s): PROBNP in the last 8760 hours.  CBG: Recent Labs  Lab 07/03/19 0618 07/03/19 1123 07/03/19 1623 07/03/19 2149 07/04/19 0637  GLUCAP 164* 260* 281* 297* 106*    No results found for this or any previous visit (from the past 240 hour(s)).   Studies: No results found.  Scheduled Meds: . amiodarone  100 mg Oral Daily  . amLODipine  10 mg Oral Daily  . amoxicillin-clavulanate  1 tablet Oral Q12H  . apixaban  5 mg Oral BID  . atorvastatin  40 mg Oral q1800  . bisoprolol  5 mg Oral Daily  . cholecalciferol  1,000 Units Oral Daily  . clopidogrel  75 mg Oral Daily  . docusate sodium  100 mg Oral Daily  . folic acid  1 mg Oral Daily  . insulin aspart  0-20 Units Subcutaneous TID WC  . insulin aspart  0-5 Units Subcutaneous QHS  . insulin glargine  40 Units Subcutaneous BID   . levothyroxine  50 mcg Oral QAC breakfast  . multivitamin with minerals  1 tablet Oral Daily  . nicotine  21 mg Transdermal Daily  . pantoprazole  40 mg Oral Daily  . sodium chloride flush  10-40 mL Intracatheter Q12H  . thiamine  100 mg Oral Daily  . torsemide  40 mg Oral BID   Continuous Infusions: . sodium chloride 10 mL/hr at 06/28/19 2057  . sodium chloride    . vancomycin 1,000 mg (07/03/19 1239)    Principal Problem:   Osteomyelitis ([place]) Active Problems:   DM (diabetes mellitus), type 2 with peripheral vascular complications ([name])   Carotid arterial disease ([name])   Coronary artery disease   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   Chronic combined systolic and diastolic CHF (congestive heart failure) ([name])   COPD GOLD 0    CKD (chronic kidney disease), stage III ([name])   CHF (congestive heart failure) ([place])      [name]  [facility]

## 2019-07-05 ENCOUNTER — Inpatient Hospital Stay (HOSPITAL_COMMUNITY): Payer: Medicare Other

## 2019-07-05 LAB — GLUCOSE, CAPILLARY
Glucose-Capillary: 64 mg/dL — ABNORMAL LOW (ref 70–99)
Glucose-Capillary: 79 mg/dL (ref 70–99)
Glucose-Capillary: 155 mg/dL — ABNORMAL HIGH (ref 70–99)
Glucose-Capillary: 108 mg/dL — ABNORMAL HIGH (ref 70–99)
Glucose-Capillary: 208 mg/dL — ABNORMAL HIGH (ref 70–99)

## 2019-07-05 LAB — CBC
HCT: 24.7 % — ABNORMAL LOW (ref 36.0–46.0)
Hemoglobin: 7.7 g/dL — ABNORMAL LOW (ref 12.0–15.0)
MCH: 29.2 pg (ref 26.0–34.0)
MCHC: 31.2 g/dL (ref 30.0–36.0)
MCV: 93.6 fL (ref 80.0–100.0)
Platelets: 464 10*3/uL — ABNORMAL HIGH (ref 150–400)
RBC: 2.64 MIL/uL — ABNORMAL LOW (ref 3.87–5.11)
RDW: 13.4 % (ref 11.5–15.5)
WBC: 14.5 10*3/uL — ABNORMAL HIGH (ref 4.0–10.5)
nRBC: 0 % (ref 0.0–0.2)

## 2019-07-05 MED ORDER — INSULIN ASPART 100 UNIT/ML ~~LOC~~ SOLN
8.0000 [IU] | Freq: Three times a day (TID) | SUBCUTANEOUS | Status: DC
  Administered 2019-07-05 – 2019-07-08 (×10): 8 [IU] via SUBCUTANEOUS

## 2019-07-05 MED ORDER — INSULIN GLARGINE 100 UNIT/ML ~~LOC~~ SOLN
30.0000 [IU] | Freq: Two times a day (BID) | SUBCUTANEOUS | Status: DC
  Administered 2019-07-05 – 2019-07-08 (×7): 30 [IU] via SUBCUTANEOUS
  Filled 2019-07-05 (×8): qty 0.3

## 2019-07-05 NOTE — Progress Notes (Addendum)
[facility] [place]  Subjective  - Feeling better over all, shoulder mobility WNL, hand/wrist better.   Objective (!) 140/53 79 99.3 F (37.4 C) (Oral) 16 97%  Intake/Output Summary (Last 24 hours) at 07/05/2019 0718 Last data filed at 07/05/2019 0525 Gross per 24 hour  Intake 970 ml  Output 450 ml  Net 520 ml    Palpable lateral bypass pulse, doppler AT/peroneal 5th toe amp site healing well, lower leg incisions without drainage healing well.  Chronic cellulitis. Medial thigh and groin incisions intact with clear fluid drainage.     Assessment/Planning: POD # 7  Left femoral to anterior tibial bypass with non-reversed great saphenous vein and left fifth toe ray amputation  Patent bypass left LE.   If OK with Dr. Donnetta Hutching we will d/c vanco started on 06/30/19 and she can cont. Augmentin per primary team. Continue dry guaze changes multiple times a day left groin to protect the incision.   No new labs this am. Stable from a vascular point of view.    Roxy Horseman 07/05/2019 7:18 AM --  Laboratory Lab Results: Recent Labs    07/03/19 0641 07/04/19 1001  WBC 14.9* 14.6*  HGB 8.3* 7.9*  HCT 26.1* 25.2*  PLT 431* 441*   BMET Recent Labs    07/03/19 0641 07/04/19 0300  NA 133* 133*  K 3.9 3.7  CL 94* 94*  CO2 27 27  GLUCOSE 159* 173*  BUN 77* 71*  CREATININE 1.59* 1.39*  CALCIUM 8.4* 8.0*    COAG Lab Results  Component Value Date   INR 1.1 06/28/2019   INR 1.0 03/28/2018   INR 1.21 04/28/2017   No results found for: PTT  I have examined the patient, reviewed and agree with above.  Agree with discontinuing vancomycin. Stable from vascular surgery standpoint.  I will be out of town over the weekend.  Please call my partners for any vascular concerns.  Curt Jews, MD 07/05/2019 10:22 AM

## 2019-07-05 NOTE — Progress Notes (Signed)
Inpatient Diabetes Program Recommendations  AACE/ADA: New Consensus Statement on Inpatient Glycemic Control (2015)  Target Ranges:  Prepandial:   less than 140 mg/dL      Peak postprandial:   less than 180 mg/dL (1-2 hours)      Critically ill patients:  140 - 180 mg/dL   Results for QUIARA, KORZENIEWSKI (MRN [id number]) as of 07/05/2019 08:13  Ref. Range 07/04/2019 06:37 07/04/2019 12:08 07/04/2019 17:36 07/04/2019 21:30  Glucose-Capillary Latest Ref Range: 70 - 99 mg/dL 106 (H) 313 (H)  15 units NOVOLOG +  40 units LANTUS  198 (H)  4 units NOVOLOG  265 (H)  3 units NOVOLOG +  40 units LANTUS    Results for ASPYN, CESARIO (MRN [id number]) as of 07/05/2019 08:13  Ref. Range 07/05/2019 06:13  Glucose-Capillary Latest Ref Range: 70 - 99 mg/dL 64 (L)   Results for [name], [name] (MRN [id number]) as of 07/05/2019 08:13  Ref. Range 06/21/2019 08:11  Hemoglobin A1C Latest Ref Range: 4.8 - 5.6 % 7.2 (H)     Home DM Meds: Lantus 40 units QHS       Humalog 15 units TID  Current Orders: Lantus 40 units BID      Novolog Resistant Correction Scale/ SSI (0-20 units) TID AC + HS      MD- Note patient with Mild Hypoglycemia this AM (CBG 64 mg/dl).  Per records, patient only takes Lantus 40 units QHS at home--getting double that here in hospital.  Post-meal CBGs have been elevated.  Please consider the following:  1. Reduce Lantus to 30 units BID  2. Continue current Novolog SSI  3. Start Novolog Meal Coverage: Novolog 8 units TID with meals (50% total home dose of rapid-acting insulin--takes Humalog 15 units TID with meals at home)      --Will follow patient during hospitalization--  Wyn Quaker RN, MSN, CDE Diabetes Coordinator Inpatient Glycemic Control Team Team Pager: [phone] (8a-5p)

## 2019-07-05 NOTE — Progress Notes (Signed)
[facility]  PROGRESS NOTE  [name] [id number] DOB: Jun 11, 1961 DOA: 06/21/2019 PCP: Tsosie Billing, MD (Inactive)  Brief History    [name] is a 58 y.o. year old female with medical history significant for  diabetes mellitus, tobacco abuse, still smoking, CAD, peripheral vascular disease s/p angiogram and stent by Dr. Gwenlyn Found, A. Fib on Eliquis, COPD, cirrhosis, OSA, CKD stage III, hypertension who presented on 06/21/2019 after being directed to the ED by her PCP due to elevated potassium of 5.9, while on Aldactone, losartan and potassium supplementation as well as poorly healing left fifth toe ulcer with foul smell for at least 3 weeks and was found to have osteomyelitis of left fifth toe.  A & P   Osteomyelitis and gangrene of left fifth toe in known diabetic with poor healing related to PAD.  Status post left fifth toe ray amputation, blood cultures are unremarkable.  Vancomycin briefly discontinued on 8/8 but resumed due to worsening leukocytosis (14--22). Of note patients nasal swab pos for MRSA on PCR, WBC remains stable around 14 and afebrile on current regimen with Augmentin and Vancomycin.  Does have some slight erythema on anterior leg suspect is more related to poor drainage related to vein graft site removal and not cellulitis. Will dc vancomycin and continue augmentin alone, closely monitor  Right wrist pain. Increased swelling not improving with supportive care. Obtain XR  Critical limb ischemia with gangrenous left fifth toe, status post left femoral to anterior tibial bypass disease( 8/14), postoperative day 5.  Underwent attempted endovascular treatment on 8/12 that was unsuccessful prompting bypass.  Continue dressing changes as recommended by vascular.  CKD stage III, stable Baseline creatinine seems to fluctuate between 1.3-1.8.  Briefly elevated 2.16 on 8/13, currently stable within baseline.  Avoid nephrotoxins, monitor output..  Acute on chronic  normocytic anemia.  Stable.  Baseline hemoglobin 8-9.  Nadir of 6.6 on this admission.  Currently around 8, transfuse goal hemoglobin less than 7  Paroxysmal atrial fibrillation, rate controlled.  Continue Eliquis and amiodarone.  Nonischemic cardiomyopathy with EF of 45%, stable.  Normal volume status, close monitor.  Daily weights, holding home metolazone, torsemide, spironolactone, losartan  Type 2 diabetes with peripheral neuropathy on insulin, fasting blood sugar with hypoglycemia 50 this a.m., will reduce Lantus to 30 units twice daily (her home dose), for postprandial hyperglycemia-8 units 3 times daily with meals (this is half her home regimen with meals), appreciate diabetes coordinator recommendations, sliding scale as needed.  Hypertension, continue amlodipine/torsemide.  Losartan and Aldactone held due to well-controlled BP will discontinue on discharge.   DVT prophylaxis: eliquis Code Status: FULL Family Communication: No family at bedside Disposition Plan: PT recommends SNF- C/m arranging, insurance denied CIR> medically stable for discharge    [facility] Direct contact: see [email or web address] (further directions at bottom of note if needed) 7PM-7AM contact night coverage as at bottom of note 07/05/2019, 8:30 AM  LOS: 14 days   Consultants  . Cardiology, orthopedics, vascular  Procedures  . 8/14: Left femoral to anterior tibial bypass with non-reversed great saphenous vein and left fifth toe ray amputation  Antibiotics  . Vancomycin . Augmentin  Interval History/Subjective  Still having right wrist pain. Otherwise no complaints  Objective   Vitals:  Vitals:   07/04/19 2015 07/05/19 0525  BP: 116/90 (!) 140/53  Pulse: 66 79  Resp: 14 16  Temp: 98.3 F (36.8 C) 99.3 F (37.4 C)  SpO2: 99% 97%    Exam:  Awake  Alert, Oriented X 3, No new F.N deficits, Normal affect [name].AT,PERRAL Supple Neck,No JVD Symmetrical Chest wall movement, Good air movement  bilaterally, CTAB RRR,No Gallops,Rubs or new Murmurs, No Parasternal Heave +ve B.Sounds, Abd Soft, No tenderness, No organomegaly appriciated, No rebound - guarding or rigidity. Post surgical site healing with minimal drainage on left lateral foot. Vein graft sites healing well with serosanguinous drainage on anterior left leg with some surrounding erythema that blanches  Right wrist with some swelling on medial side and tenderness  I have personally reviewed the following:   Data Reviewed: Basic Metabolic Panel: Recent Labs  Lab 06/30/19 0705 07/01/19 0410 07/02/19 0304 07/03/19 0641 07/04/19 0300  NA 134* 137 135 133* 133*  K 3.7 3.7 3.4* 3.9 3.7  CL 93* 98 94* 94* 94*  CO2 28 29 29 27 27   GLUCOSE 227* 152* 196* 159* 173*  BUN 77* 79* 70* 77* 71*  CREATININE 1.39* 1.48* 1.36* 1.59* 1.39*  CALCIUM 8.5* 8.0* 8.4* 8.4* 8.0*   Liver Function Tests: No results for input(s): AST, ALT, ALKPHOS, BILITOT, PROT, ALBUMIN in the last 168 hours. No results for input(s): LIPASE, AMYLASE in the last 168 hours. No results for input(s): AMMONIA in the last 168 hours. CBC: Recent Labs  Lab 06/30/19 0705 07/01/19 0410 07/02/19 0304 07/03/19 0641 07/04/19 1001  WBC 18.7* 14.6* 16.9* 14.9* 14.6*  HGB 8.2* 7.3* 7.4* 8.3* 7.9*  HCT 25.2* 22.4* 22.9* 26.1* 25.2*  MCV 94.4 91.8 92.0 93.9 93.3  PLT 313 280 331 431* 441*   Cardiac Enzymes: No results for input(s): CKTOTAL, CKMB, CKMBINDEX, TROPONINI in the last 168 hours. BNP (last 3 results) Recent Labs    10/18/18 1109 10/21/18 0125 11/27/18 0519  BNP 410.0* 548.1* 511.3*    ProBNP (last 3 results) No results for input(s): PROBNP in the last 8760 hours.  CBG: Recent Labs  Lab 07/04/19 1208 07/04/19 1736 07/04/19 2130 07/05/19 0613 07/05/19 0646  GLUCAP 313* 198* 265* 64* 79    No results found for this or any previous visit (from the past 240 hour(s)).   Studies: No results found.  Scheduled Meds: . amiodarone  100  mg Oral Daily  . amLODipine  10 mg Oral Daily  . amoxicillin-clavulanate  1 tablet Oral Q12H  . apixaban  5 mg Oral BID  . atorvastatin  40 mg Oral q1800  . bisoprolol  5 mg Oral Daily  . cholecalciferol  1,000 Units Oral Daily  . clopidogrel  75 mg Oral Daily  . diclofenac sodium  2 g Topical QID  . docusate sodium  100 mg Oral Daily  . folic acid  1 mg Oral Daily  . insulin aspart  0-20 Units Subcutaneous TID WC  . insulin aspart  0-5 Units Subcutaneous QHS  . insulin aspart  8 Units Subcutaneous TID WC  . insulin glargine  30 Units Subcutaneous BID  . levothyroxine  50 mcg Oral QAC breakfast  . multivitamin with minerals  1 tablet Oral Daily  . nicotine  21 mg Transdermal Daily  . pantoprazole  40 mg Oral Daily  . sodium chloride flush  10-40 mL Intracatheter Q12H  . thiamine  100 mg Oral Daily  . torsemide  40 mg Oral BID   Continuous Infusions: . sodium chloride 10 mL/hr at 06/28/19 2057  . sodium chloride    . vancomycin Stopped (07/04/19 1345)    Principal Problem:   Osteomyelitis ([name]) Active Problems:   DM (diabetes mellitus), type 2 with peripheral vascular  complications ([name])   Carotid arterial disease ([name])   Coronary artery disease   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   Chronic combined systolic and diastolic CHF (congestive heart failure) ([name])   COPD GOLD 0    CKD (chronic kidney disease), stage III ([name])   CHF (congestive heart failure) ([place])      Desiree Hane  [facility]

## 2019-07-05 NOTE — Progress Notes (Signed)
Physical Therapy Treatment Patient Details Name: [name] MRN: [id number] DOB: 10-03-1961 Today's Date: 07/05/2019    History of Present Illness Pt is a 58 y.o. 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 past medical history of diabetes mellitus, tobacco abuse, still smoking, CAD, peripheral vascular disease s/p angiogram and stent, A. Fib, COPD, cirrhosis, OSA, CKD stage III, hypertension    PT Comments    Patient agrees to PT visit, reports her right hand is still hurting, swollen. She also reports right foot hurting this day with ambulation. Patient requires min quard assist with bed mobility, min assist with sit to stand transfers. She was able to ambulate 60 feet with RW and min guard assist. Limited by pain in right hand and right foot. Patient will continue to benefit from skilled PT to address her difficulty with mobility, weakness, decreased activity tolerance.      Follow Up Recommendations  SNF     Equipment Recommendations  None recommended by PT    Recommendations for Other Services       Precautions / Restrictions Precautions Precautions: Fall Precaution Comments: weeping L LE incision Restrictions Weight Bearing Restrictions: Yes LLE Weight Bearing: Weight bearing as tolerated Other Position/Activity Restrictions: post op shoe    Mobility  Bed Mobility Overal bed mobility: Needs Assistance Bed Mobility: Sit to Supine     Supine to sit: Min guard        Transfers Overall transfer level: Needs assistance Equipment used: Rolling walker (2 wheeled) Transfers: Sit to/from Stand Sit to Stand: Min assist            Ambulation/Gait Ambulation/Gait assistance: Min guard Gait Distance (Feet): 60 Feet Assistive device: Rolling walker (2 wheeled)   Gait velocity: decreased       Stairs              Wheelchair Mobility    Modified Rankin (Stroke Patients Only)       Balance Overall balance assessment: Needs assistance Sitting-balance support: Feet supported Sitting balance-Leahy Scale: Good     Standing balance support: Bilateral upper extremity supported Standing balance-Leahy Scale: Fair                              Cognition   Behavior During Therapy: WFL for tasks assessed/performed Overall Cognitive Status: Within Functional Limits for tasks assessed                                        Exercises      General Comments        Pertinent Vitals/Pain Pain Assessment: 0-10 Pain Score: 8  Pain Location: R hand, R foot Pain Descriptors / Indicators: Discomfort;Aching;Grimacing Pain Intervention(s): Monitored during session    Home Living                      Prior Function            PT Goals (current goals can now be found in the care plan section) Acute Rehab PT Goals Patient Stated Goal: "go to rehab first instead of home" PT Goal Formulation: With patient Time For Goal Achievement: 07/13/19 Potential to Achieve Goals: Good Progress towards PT goals: Progressing toward goals  Frequency    Min 3X/week      PT Plan Current plan remains appropriate    Co-evaluation              AM-PAC PT "6 Clicks" Mobility   Outcome Measure  Help needed turning from your back to your side while in a flat bed without using bedrails?: A Little Help needed moving from lying on your back to sitting on the side of a flat bed without using bedrails?: A Little Help needed moving to and from a bed to a chair (including a wheelchair)?: A Little Help needed standing up from a chair using your arms (e.g., wheelchair or bedside chair)?: A Little Help needed to walk in hospital room?: A Little Help needed climbing 3-5 steps with a railing? : A Lot 6 Click Score: 17    End of Session Equipment Utilized During  Treatment: Gait belt Activity Tolerance: Patient limited by fatigue;Patient limited by pain Patient left: in chair;with call bell/phone within reach Nurse Communication: Mobility status PT Visit Diagnosis: Unsteadiness on feet (R26.81);Muscle weakness (generalized) (M62.81);Difficulty in walking, not elsewhere classified (R26.2);Pain Pain - Right/Left: Right Pain - part of body: Hand;Ankle and joints of foot     Time: 1042-1105 PT Time Calculation (min) (ACUTE ONLY): 23 min  Charges:  $Gait Training: 8-22 mins $Therapeutic Activity: 8-22 mins                     Zacharius Funari, PT, GCS 07/05/19,12:43 PM

## 2019-07-05 NOTE — TOC Progression Note (Signed)
Transition of Care [facility]) - Progression Note    Patient Details  Name: CATHYRN PINERA MRN: [id number] Date of Birth: 1960/12/12  Transition of Care [facility]) CM/SW [place], [place] Phone Number: 07/05/2019, 11:26 AM  Clinical Narrative:     [facility]ai[facility] Cowman confirmed bed offer. CSW informed RN patient will need Covid Test and insurance authorization prior to discharge. Insurance authorization started today. CSW will continue to follow and assist with discharge planning.   [name], MSW, [facility] Clinical Social Worker [phone]   Expected Discharge Plan: Skilled Nursing Facility Barriers to Discharge: Continued Medical Work up  Expected Discharge Plan and Services Expected Discharge Plan: [place] In-house Referral: Clinical Social Work Discharge Planning Services: CM Consult Post Acute Care Choice: Home Health, Durable Medical Equipment Living arrangements for the past 2 months: Single Family Home                 DME Arranged: 3-N-1         [place] Arranged: PT [place] Agency: [facility] [employer] Date [place]: 06/30/19 Time [place]: 1009 Representative spoke with at [place]: [place] ([place]) Interventions    Readmission Risk Interventions No flowsheet data found.

## 2019-07-06 DIAGNOSIS — M25531 Pain in right wrist: Secondary | ICD-10-CM

## 2019-07-06 LAB — CBC
HCT: 24.4 % — ABNORMAL LOW (ref 36.0–46.0)
Hemoglobin: 7.5 g/dL — ABNORMAL LOW (ref 12.0–15.0)
MCH: 29.3 pg (ref 26.0–34.0)
MCHC: 30.7 g/dL (ref 30.0–36.0)
MCV: 95.3 fL (ref 80.0–100.0)
Platelets: 468 10*3/uL — ABNORMAL HIGH (ref 150–400)
RBC: 2.56 MIL/uL — ABNORMAL LOW (ref 3.87–5.11)
RDW: 13.6 % (ref 11.5–15.5)
WBC: 12.3 10*3/uL — ABNORMAL HIGH (ref 4.0–10.5)
nRBC: 0 % (ref 0.0–0.2)

## 2019-07-06 LAB — BASIC METABOLIC PANEL
Anion gap: 13 (ref 5–15)
BUN: 49 mg/dL — ABNORMAL HIGH (ref 6–20)
CO2: 27 mmol/L (ref 22–32)
Calcium: 8.2 mg/dL — ABNORMAL LOW (ref 8.9–10.3)
Chloride: 97 mmol/L — ABNORMAL LOW (ref 98–111)
Creatinine, Ser: 1.21 mg/dL — ABNORMAL HIGH (ref 0.44–1.00)
GFR calc Af Amer: 58 mL/min — ABNORMAL LOW (ref >60)
GFR calc non Af Amer: 50 mL/min — ABNORMAL LOW (ref >60)
Glucose, Bld: 185 mg/dL — ABNORMAL HIGH (ref 70–99)
Potassium: 3.7 mmol/L (ref 3.5–5.1)
Sodium: 137 mmol/L (ref 135–145)

## 2019-07-06 LAB — GLUCOSE, CAPILLARY
Glucose-Capillary: 118 mg/dL — ABNORMAL HIGH (ref 70–99)
Glucose-Capillary: 224 mg/dL — ABNORMAL HIGH (ref 70–99)
Glucose-Capillary: 146 mg/dL — ABNORMAL HIGH (ref 70–99)
Glucose-Capillary: 153 mg/dL — ABNORMAL HIGH (ref 70–99)

## 2019-07-06 LAB — NOVEL CORONAVIRUS, NAA (HOSP ORDER, SEND-OUT TO REF LAB; TAT 18-24 HRS): SARS-CoV-2, NAA: NOT DETECTED

## 2019-07-06 IMAGING — DX DG CHEST 2V
2 series · 2 of 2 positions shown · non-contrast
Comparison: Chest radiograph dated 08/18/2018

CLINICAL DATA: 56-year-old female with shortness of breath.

EXAM:
CHEST - 2 VIEW

[chest lat]
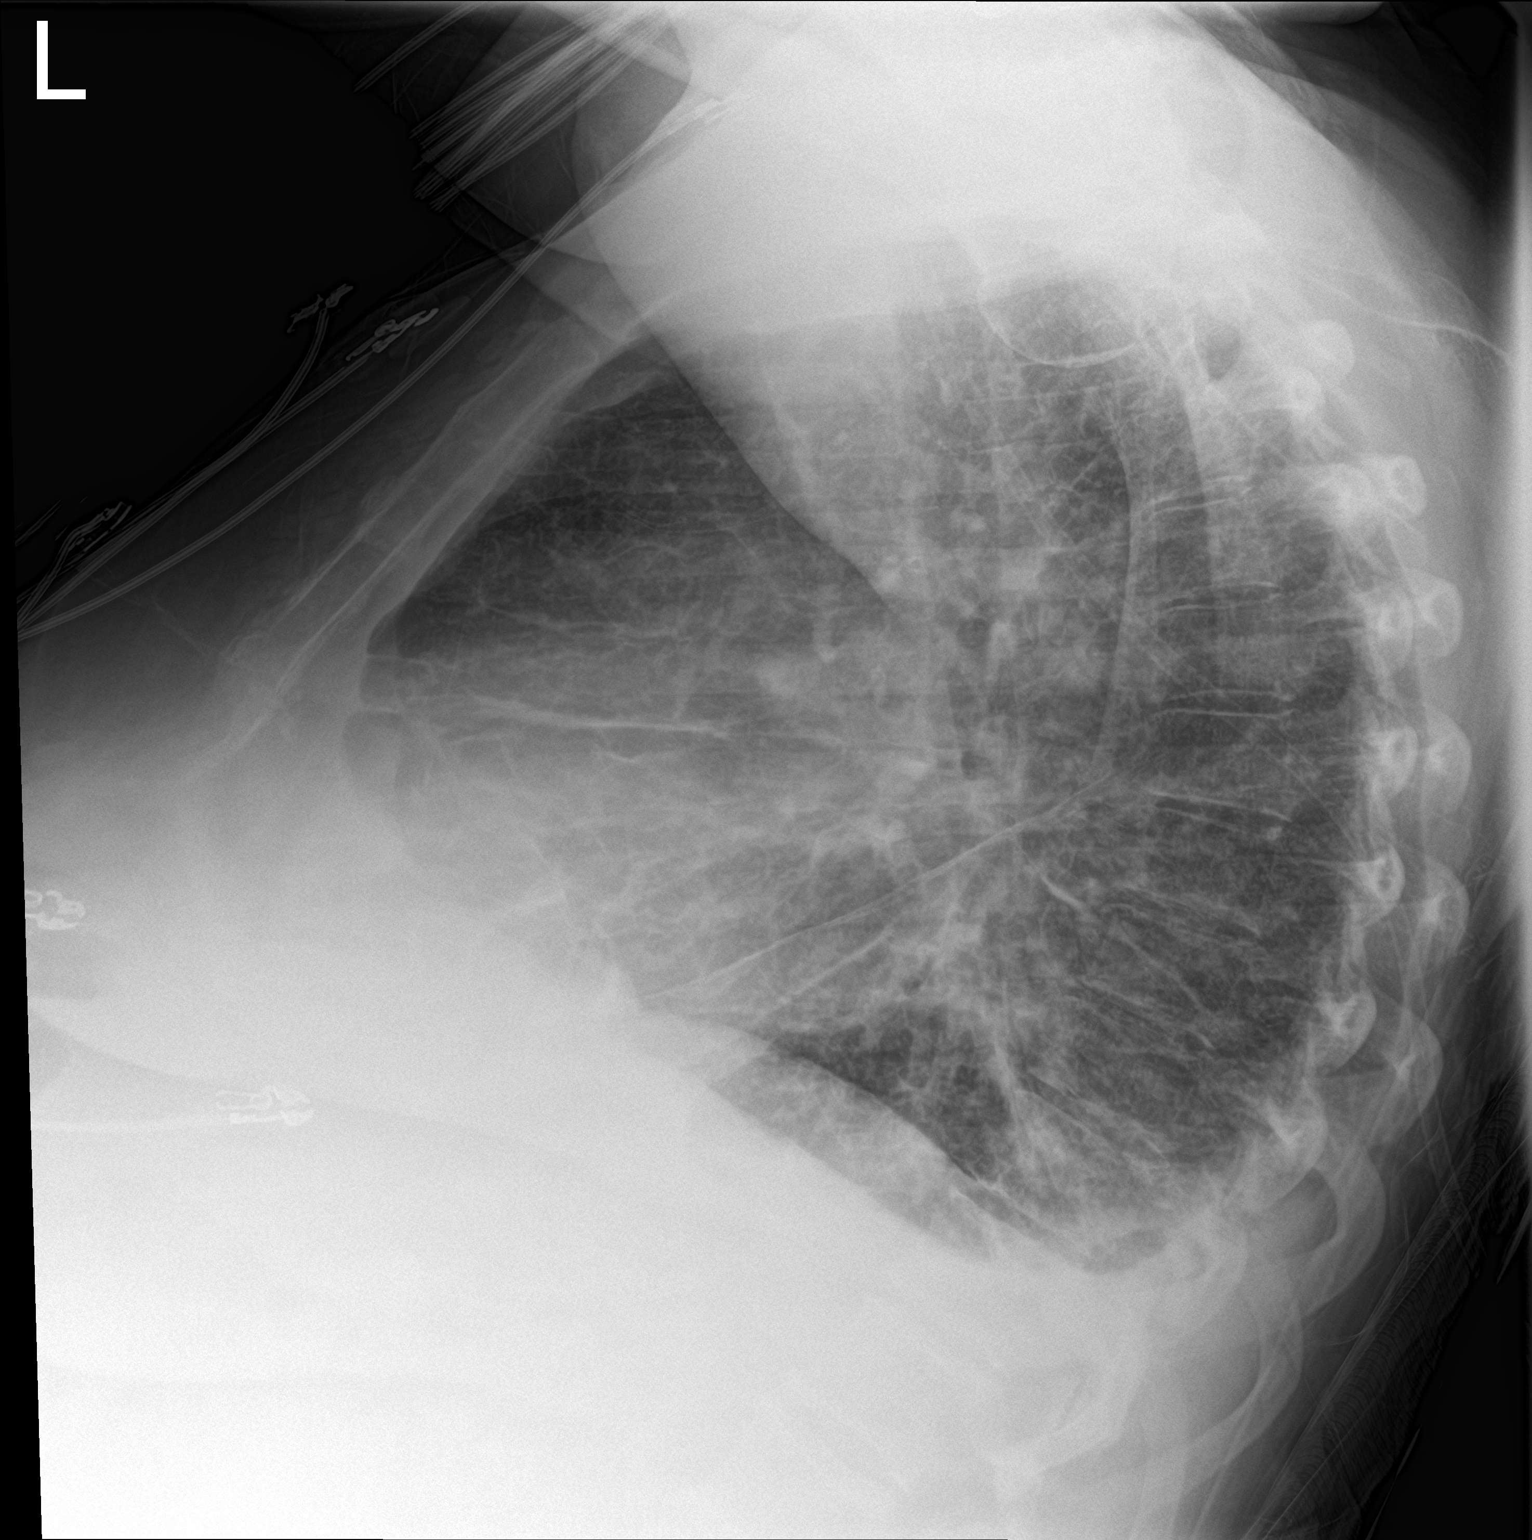

[chest ap]
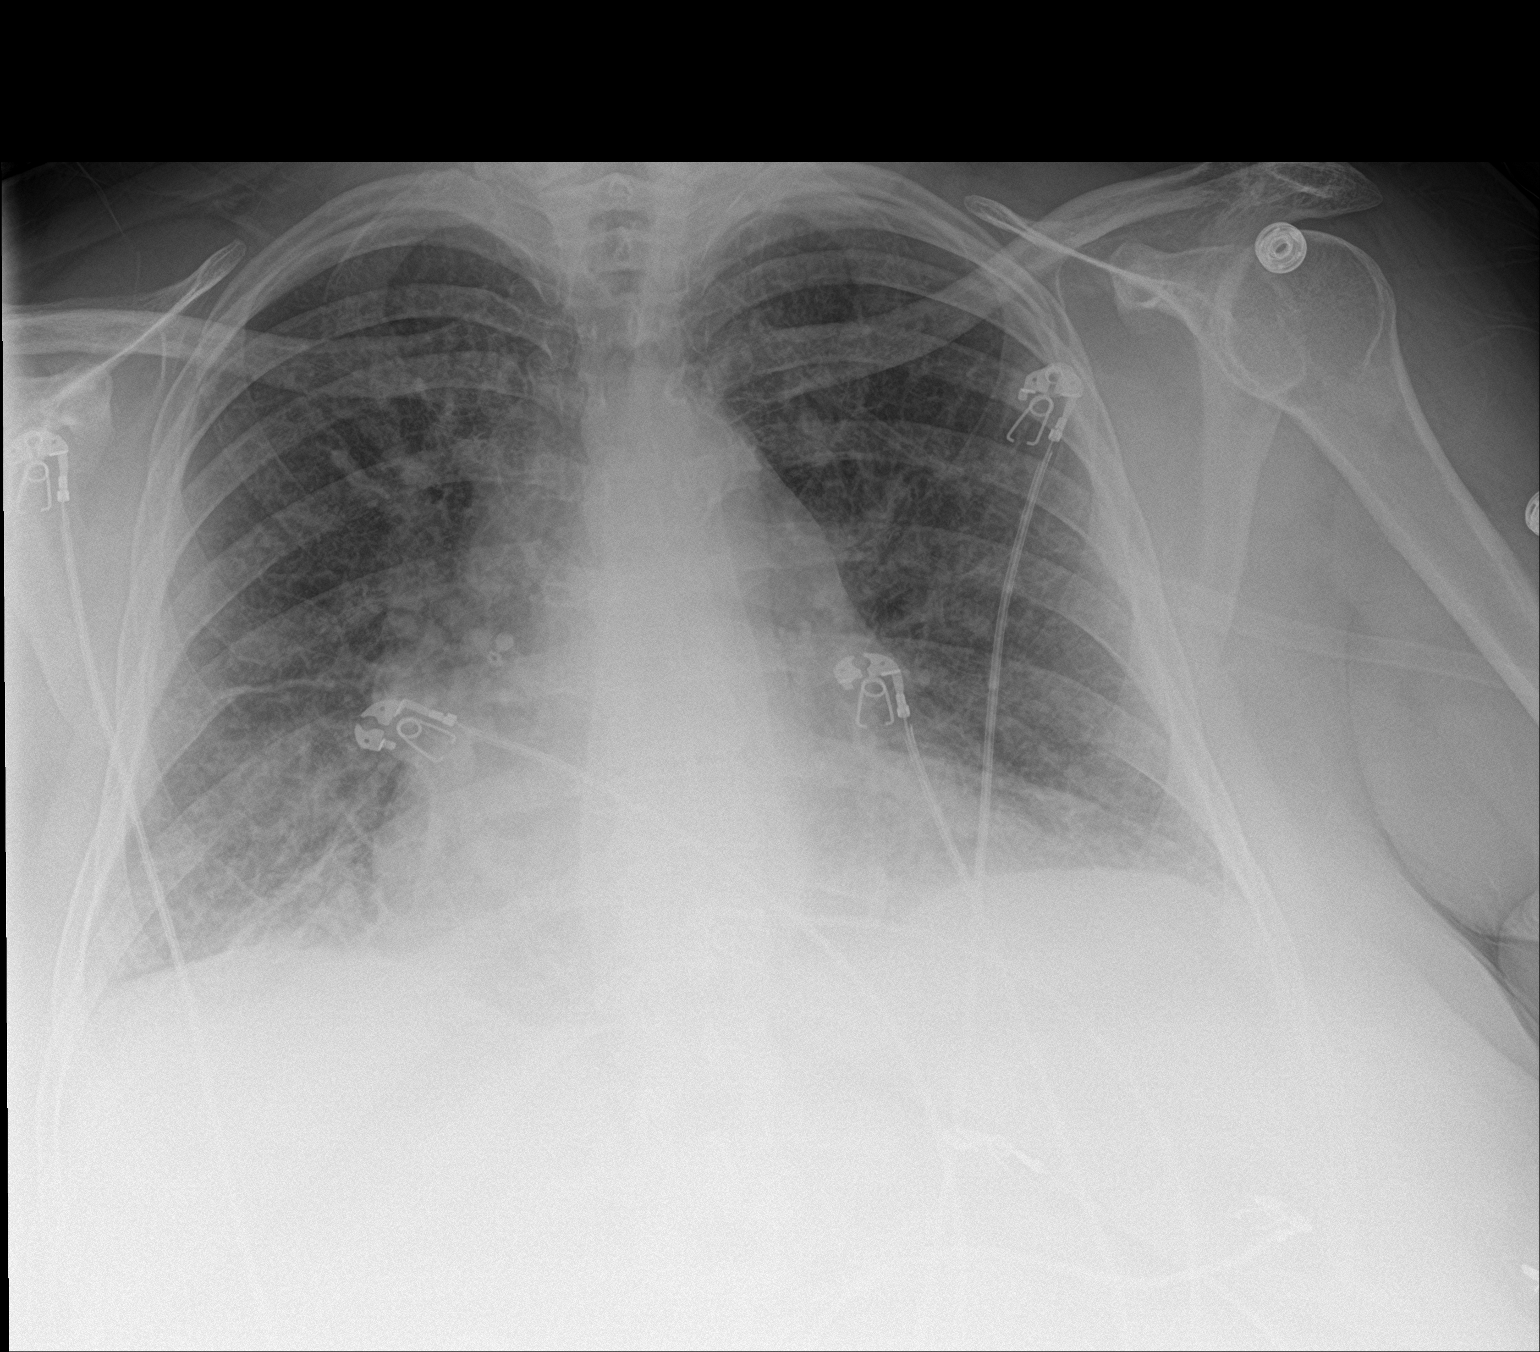

[2 of 2 positions shown; findings below may reference images not displayed]

FINDINGS: Cardiomegaly with mild vascular congestion. No focal consolidation
or pneumothorax. Small bilateral pleural effusions. No acute osseous
pathology.
IMPRESSION: Cardiomegaly with mild vascular congestion and small bilateral
pleural effusions.

## 2019-07-06 MED ORDER — SENNOSIDES-DOCUSATE SODIUM 8.6-50 MG PO TABS
2.0000 | ORAL_TABLET | Freq: Two times a day (BID) | ORAL | Status: DC
  Administered 2019-07-06 – 2019-07-08 (×5): 2 via ORAL
  Filled 2019-07-06 (×5): qty 2

## 2019-07-06 NOTE — Progress Notes (Signed)
Orthopedic Tech Progress Note Patient Details:  [name] 1961-08-05 [id number]  Ortho Devices Type of Ortho Device: Velcro wrist forearm splint Ortho Device/Splint Location: right Ortho Device/Splint Interventions: Application   Post Interventions Patient Tolerated: Well Instructions Provided: Care of device   [place] Pink 07/06/2019, 11:57 AM

## 2019-07-06 NOTE — Progress Notes (Addendum)
[facility]  PROGRESS NOTE  [name] [id number] DOB: 1960/12/22 DOA: 06/21/2019 PCP: Tsosie Billing, MD (Inactive)  Brief History    [name] is a 58 y.o. year old female with medical history significant for  diabetes mellitus, tobacco abuse, still smoking, CAD, peripheral vascular disease s/p angiogram and stent by Dr. Gwenlyn Found, A. Fib on Eliquis, COPD, cirrhosis, OSA, CKD stage III, hypertension who presented on 06/21/2019 after being directed to the ED by her PCP due to elevated potassium of 5.9, while on Aldactone, losartan and potassium supplementation as well as poorly healing left fifth toe ulcer with foul smell for at least 3 weeks and was found to have osteomyelitis of left fifth toe.  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  Status post left fifth toe ray amputation, blood cultures are unremarkable.  Vancomycin briefly discontinued on 8/8 but resumed due to worsening leukocytosis (14--22). Remains afebrile off vancomycin (MRSA PCR negative)--discontinued on 8/21. ..  Does have some slight erythema on anterior leg suspect is more related to poor drainage /venous stasis related to vein graft site removal and not cellulitis. Will dc vancomycin and continue augmentin( since 8/16) alone, closely monitor  Right wrist pain. Swelling stable, minimal swelling without fracture on  XR. Apply wrist brace  Critical limb ischemia with gangrenous left fifth toe, status post left femoral to anterior tibial bypass disease( 8/14), postoperative day 5.  Underwent attempted endovascular treatment on 8/12 that was unsuccessful prompting bypass.  Continue dressing changes as recommended by vascular.  CKD stage III, stable Baseline creatinine seems to fluctuate between 1.3-1.8.  Briefly elevated 2.16 on 8/13, currently stable within baseline.  Avoid nephrotoxins, monitor output..  Acute on chronic normocytic  anemia.  Stable.  Baseline hemoglobin 8-9.  Nadir of 6.6 on this admission.  Currently around 8, transfuse goal hemoglobin less than 7  Paroxysmal atrial fibrillation, rate controlled.  Continue Eliquis and amiodarone.  Nonischemic cardiomyopathy with EF of 45%, stable.  Normal volume status, close monitor.  Daily weights, holding home metolazone, torsemide, spironolactone, losartan  Type 2 diabetes with peripheral neuropathy on insulin, FBG doing well, no recurrent hypoglycemia on Lantus to 30 units twice daily (her home dose), for postprandial hyperglycemia-8 units 3 times daily with meals (this is half her home regimen with meals), appreciate diabetes coordinator recommendations, sliding scale as needed.  Hypertension, continue amlodipine/torsemide.  Losartan and Aldactone held due to well-controlled BP will discontinue on discharge.   DVT prophylaxis: eliquis Code Status: FULL Family Communication: No family at bedside Disposition Plan: PT recommends SNF- C/m arranging pending COVID test results and insurance auth, insurance denied CIR> medically stable for discharge    [facility] Direct contact: see [email or web address] (further directions at bottom of note if needed) 7PM-7AM contact night coverage as at bottom of note 07/06/2019, 11:40 AM  LOS: 15 days   Consultants  . Cardiology, orthopedics, vascular  Procedures  . 8/14: Left femoral to anterior tibial bypass with non-reversed great saphenous vein and left fifth toe ray amputation  Antibiotics  . Vancomycin . Augmentin  Interval History/Subjective  Warm compresses help some with right wrist pain. Wants a brace  Objective   Vitals:  Vitals:   07/06/19 0446 07/06/19 1128  BP: 137/85 (!) 116/56  Pulse: 72 75  Resp:  18  Temp: 97.8 F (36.6 C) 98.6 F (37 C)  SpO2: 100% 96%    Exam:  Awake Alert, Oriented X 3, No new F.N deficits, Normal affect [place].AT,PERRAL Supple Neck,No JVD Symmetrical Chest wall movement, Good  air movement bilaterally, CTAB RRR,No Gallops,Rubs or new Murmurs, No Parasternal Heave +ve B.Sounds, Abd Soft, No tenderness, No organomegaly appriciated, No rebound - guarding or rigidity. Post surgical site healing with minimal drainage on left lateral foot. Vein graft sites healing well with serosanguinous drainage on anterior left leg with some surrounding erythema that blanches  Right wrist with some swelling on medial side and slight tenderness  I have personally reviewed the following:   Data Reviewed: Basic Metabolic Panel: Recent Labs  Lab 07/01/19 0410 07/02/19 0304 07/03/19 0641 07/04/19 0300 07/06/19 0254  NA 137 135 133* 133* 137  K 3.7 3.4* 3.9 3.7 3.7  CL 98 94* 94* 94* 97*  CO2 29 29 27 27 27   GLUCOSE 152* 196* 159* 173* 185*  BUN 79* 70* 77* 71* 49*  CREATININE 1.48* 1.36* 1.59* 1.39* 1.21*  CALCIUM 8.0* 8.4* 8.4* 8.0* 8.2*   Liver Function Tests: No results for input(s): AST, ALT, ALKPHOS, BILITOT, PROT, ALBUMIN in the last 168 hours. No results for input(s): LIPASE, AMYLASE in the last 168 hours. No results for input(s): AMMONIA in the last 168 hours. CBC: Recent Labs  Lab 07/02/19 0304 07/03/19 0641 07/04/19 1001 07/05/19 0841 07/06/19 0254  WBC 16.9* 14.9* 14.6* 14.5* 12.3*  HGB 7.4* 8.3* 7.9* 7.7* 7.5*  HCT 22.9* 26.1* 25.2* 24.7* 24.4*  MCV 92.0 93.9 93.3 93.6 95.3  PLT 331 431* 441* 464* 468*   Cardiac Enzymes: No results for input(s): CKTOTAL, CKMB, CKMBINDEX, TROPONINI in the last 168 hours. BNP (last 3 results) Recent Labs    10/18/18 1109 10/21/18 0125 11/27/18 0519  BNP 410.0* 548.1* 511.3*    ProBNP (last 3 results) No results for input(s): PROBNP in the last 8760 hours.  CBG: Recent Labs  Lab 07/05/19 1240 07/05/19 1645 07/05/19 2152 07/06/19 0631 07/06/19 1124  GLUCAP 155* 108* 208* 118* 224*    No results found for this or any previous visit (from the past 240 hour(s)).   Studies: Dg Wrist 2 Views Right   Result Date: 07/05/2019 CLINICAL DATA:  Pain and swelling in the right wrist status post peripheral IV. EXAM: RIGHT WRIST - 2 VIEW COMPARISON:  None. FINDINGS: No fracture or dislocation. No suspicious focal osseous lesion. Minimal first carpometacarpal joint osteoarthritis. No osseous erosions or periosteal reaction. Mild dorsal right wrist soft tissue swelling. Vascular calcifications in the volar soft tissues. No radiopaque foreign body. IMPRESSION: Mild dorsal right wrist soft tissue swelling. No acute osseous abnormality. Minimal first carpometacarpal joint osteoarthritis. Electronically Signed   By: Ilona Sorrel M.D.   On: 07/05/2019 16:58    Scheduled Meds: . amiodarone  100 mg Oral Daily  . amLODipine  10 mg Oral Daily  . amoxicillin-clavulanate  1 tablet Oral Q12H  . apixaban  5 mg Oral BID  . atorvastatin  40 mg Oral q1800  . bisoprolol  5 mg Oral Daily  . cholecalciferol  1,000 Units Oral Daily  . clopidogrel  75 mg Oral Daily  . diclofenac sodium  2 g Topical QID  . docusate sodium  100 mg Oral Daily  . folic acid  1 mg Oral Daily  . insulin aspart  0-20 Units Subcutaneous TID WC  . insulin aspart  0-5 Units Subcutaneous QHS  . insulin aspart  8 Units Subcutaneous TID WC  . insulin glargine  30 Units Subcutaneous BID  . levothyroxine  50 mcg Oral QAC breakfast  . multivitamin with minerals  1 tablet Oral Daily  . nicotine  21 mg Transdermal Daily  . pantoprazole  40 mg Oral Daily  . senna-docusate  2 tablet Oral BID  . sodium chloride flush  10-40 mL Intracatheter Q12H  . thiamine  100 mg Oral Daily  . torsemide  40 mg Oral BID   Continuous Infusions: . sodium chloride 10 mL/hr at 06/28/19 2057  . sodium chloride      Principal Problem:   Osteomyelitis ([name]) Active Problems:   DM (diabetes mellitus), type 2 with peripheral vascular complications ([name])   Carotid arterial disease ([name])   Coronary artery disease   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   Chronic combined systolic  and diastolic CHF (congestive heart failure) ([name])   COPD GOLD 0    CKD (chronic kidney disease), stage III ([name])   CHF (congestive heart failure) ([place])      Desiree Hane  [facility]

## 2019-07-07 LAB — GLUCOSE, CAPILLARY
Glucose-Capillary: 88 mg/dL (ref 70–99)
Glucose-Capillary: 230 mg/dL — ABNORMAL HIGH (ref 70–99)
Glucose-Capillary: 69 mg/dL — ABNORMAL LOW (ref 70–99)
Glucose-Capillary: 276 mg/dL — ABNORMAL HIGH (ref 70–99)

## 2019-07-07 NOTE — TOC Progression Note (Signed)
Transition of Care [facility]) - Progression Note    Patient Details  Name: [name] MRN: [id number] Date of Birth: 21-Jul-1961  Transition of Care [facility]) CM/SW [place], [place] Phone Number: [phone] 07/07/2019, 10:07 AM  Clinical Narrative:     CSW reached out to [facility] to ascertain if the authorization was back. CSW was informed that authorization was not back.  CSW will continue to follow for discharge planning.  Expected Discharge Plan: [place] Barriers to Discharge: Continued Medical Work up  Expected Discharge Plan and Services Expected Discharge Plan: [place] In-house Referral: Clinical Social Work Discharge Planning Services: CM Consult Post Acute Care Choice: Home Health, Durable Medical Equipment Living arrangements for the past 2 months: Single Family Home                 DME Arranged: 3-N-1         [place] Arranged: PT [place]: [facility] [employer] Date [place]: 06/30/19 Time [place]: 1009 Representative spoke with at [place]: [place] ([place]) Interventions    Readmission Risk Interventions No flowsheet data found.

## 2019-07-07 NOTE — Progress Notes (Signed)
[facility]  PROGRESS NOTE  JAHNAI OUELLETTE [id number] DOB: 05/15/1961 DOA: 06/21/2019 PCP: Tsosie Billing, MD (Inactive)  Brief History    [name] is a 58 y.o. year old female with medical history significant for  diabetes mellitus, tobacco abuse, still smoking, CAD, peripheral vascular disease s/p angiogram and stent by Dr. Gwenlyn Found, A. Fib on Eliquis, COPD, cirrhosis, OSA, CKD stage III, hypertension who presented on 06/21/2019 after being directed to the ED by her PCP due to elevated potassium of 5.9, while on Aldactone, losartan and potassium supplementation as well as poorly healing left fifth toe ulcer with foul smell for at least 3 weeks and was found to have osteomyelitis of left fifth toe.  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  Status post left fifth toe ray amputation, blood cultures are unremarkable.  Vancomycin briefly discontinued on 8/8 but resumed due to worsening leukocytosis (14--22). Remains afebrile off vancomycin (MRSA PCR negative)--discontinued on 8/21. ..  Does have some slight erythema on anterior leg suspect is more related to poor drainage /venous stasis related to vein graft site removal and not cellulitis. Will dc vancomycin and continue augmentin( since 8/16) alone, closely monitor  Right wrist pain. Swelling stable, minimal swelling without fracture on  XR. Apply wrist brace  Critical limb ischemia with gangrenous left fifth toe, status post left femoral to anterior tibial bypass disease( 8/14), postoperative day 5.  Underwent attempted endovascular treatment on 8/12 that was unsuccessful prompting bypass.  Continue dressing changes as recommended by vascular.  CKD stage III, stable Baseline creatinine seems to fluctuate between 1.3-1.8.  Briefly elevated 2.16 on 8/13, currently stable within baseline.  Avoid nephrotoxins, monitor output..  Acute on chronic normocytic  anemia.  Stable.  Baseline hemoglobin 8-9.  Nadir of 6.6 on this admission.  Currently around 8, transfuse goal hemoglobin less than 7  Paroxysmal atrial fibrillation, rate controlled.  Continue Eliquis and amiodarone.  Nonischemic cardiomyopathy with EF of 45%, stable.  Normal volume status, close monitor.  Daily weights, holding home metolazone, torsemide, spironolactone, losartan  Type 2 diabetes with peripheral neuropathy on insulin, FBG doing well, no recurrent hypoglycemia on Lantus to 30 units twice daily (her home dose), for postprandial hyperglycemia-8 units 3 times daily with meals (this is half her home regimen with meals), appreciate diabetes coordinator recommendations, sliding scale as needed.  Hypertension, continue amlodipine/torsemide.  Losartan and Aldactone held due to well-controlled BP will discontinue on discharge.   DVT prophylaxis: eliquis Code Status: FULL Family Communication: No family at bedside Disposition Plan: PT recommends SNF- C/m arranging  insurance auth (COVID testing negative) insurance denied CIR> medically stable for discharge    [facility] Direct contact: see [email or web address] (further directions at bottom of note if needed) 7PM-7AM contact night coverage as at bottom of note 07/07/2019, 4:41 PM  LOS: 16 days   Consultants   Cardiology, orthopedics, vascular  Procedures   8/14: Left femoral to anterior tibial bypass with non-reversed great saphenous vein and left fifth toe ray amputation  Antibiotics   Vancomycin  Augmentin  Interval History/Subjective  Wrist feels much better with brace  no acute complaints  Objective   Vitals:  Vitals:   07/07/19 1126 07/07/19 1559  BP: (!) 121/56 (!) 132/92  Pulse: 64 80  Resp: 18 16  Temp: 98.4 F (36.9 C) 98.4 F (36.9 C)  SpO2: 92% 99%    Exam:  Awake  Alert, Oriented X 3, No new F.N deficits, Normal affect [place].AT,PERRAL Symmetrical Chest wall movement, Good air movement bilaterally,  CTAB RRR,No Gallops,Rubs or new Murmurs, No Parasternal Heave +ve B.Sounds, Abd Soft, No tenderness, No organomegaly appriciated, No rebound - guarding or rigidity. Post surgical site healing with minimal drainage on left lateral foot. Vein graft sites healing well with serosanguinous drainage on anterior left leg with some surrounding erythema that blanches  Right wrist with some swelling on medial side and slight tenderness,has on wrist brace  I have personally reviewed the following:   Data Reviewed: Basic Metabolic Panel: Recent Labs  Lab 07/01/19 0410 07/02/19 0304 07/03/19 0641 07/04/19 0300 07/06/19 0254  NA 137 135 133* 133* 137  K 3.7 3.4* 3.9 3.7 3.7  CL 98 94* 94* 94* 97*  CO2 29 29 27 27 27   GLUCOSE 152* 196* 159* 173* 185*  BUN 79* 70* 77* 71* 49*  CREATININE 1.48* 1.36* 1.59* 1.39* 1.21*  CALCIUM 8.0* 8.4* 8.4* 8.0* 8.2*   Liver Function Tests: No results for input(s): AST, ALT, ALKPHOS, BILITOT, PROT, ALBUMIN in the last 168 hours. No results for input(s): LIPASE, AMYLASE in the last 168 hours. No results for input(s): AMMONIA in the last 168 hours. CBC: Recent Labs  Lab 07/02/19 0304 07/03/19 0641 07/04/19 1001 07/05/19 0841 07/06/19 0254  WBC 16.9* 14.9* 14.6* 14.5* 12.3*  HGB 7.4* 8.3* 7.9* 7.7* 7.5*  HCT 22.9* 26.1* 25.2* 24.7* 24.4*  MCV 92.0 93.9 93.3 93.6 95.3  PLT 331 431* 441* 464* 468*   Cardiac Enzymes: No results for input(s): CKTOTAL, CKMB, CKMBINDEX, TROPONINI in the last 168 hours. BNP (last 3 results) Recent Labs    10/18/18 1109 10/21/18 0125 11/27/18 0519  BNP 410.0* 548.1* 511.3*    ProBNP (last 3 results) No results for input(s): PROBNP in the last 8760 hours.  CBG: Recent Labs  Lab 07/06/19 1606 07/06/19 2141 07/07/19 0620 07/07/19 1125 07/07/19 1605  GLUCAP 146* 153* 88 230* 69*    Recent Results (from the past 240 hour(s))  Novel Coronavirus, NAA (hospital order; send-out to ref lab)     Status: None    Collection Time: 07/05/19  2:58 PM   Specimen: Nasopharyngeal Swab; Respiratory  Result Value Ref Range Status   SARS-CoV-2, NAA NOT DETECTED NOT DETECTED Final    Comment: (NOTE) This test was developed and its performance characteristics determined by [employer]. This test has not been FDA cleared or approved. This test has been authorized by FDA under an Emergency Use Authorization (EUA). This test is only authorized for the duration of time the declaration that circumstances exist justifying the authorization of the emergency use of in vitro diagnostic tests for detection of SARS-CoV-2 virus and/or diagnosis of COVID-19 infection under section 564(b)(1) of the Act, 21 U.S.C. KA:123727), unless the authorization is terminated or revoked sooner. When diagnostic testing is negative, the possibility of a false negative result should be considered in the context of a patient's recent exposures and the presence of clinical signs and symptoms consistent with COVID-19. An individual without symptoms of COVID-19 and who is not shedding SARS-CoV-2 virus would expect to have a negative (not detected) result in this assay. Performed  At: [facility] [address] [place], [place] [id number] Rush Farmer MD [id number]    [place]  Final    Comment: Performed at [facility], [place] [address] [place], [place] [address]     Studies: Dg Wrist 2 Views Right  Result Date: 07/05/2019 CLINICAL DATA:  Pain and swelling in the right wrist status post peripheral IV. EXAM: RIGHT WRIST - 2 VIEW COMPARISON:  None. FINDINGS: No fracture or dislocation. No suspicious focal osseous lesion. Minimal first carpometacarpal joint osteoarthritis. No osseous erosions or periosteal reaction. Mild dorsal right wrist soft tissue swelling. Vascular calcifications in the volar soft tissues. No radiopaque foreign body. IMPRESSION: Mild dorsal right wrist soft  tissue swelling. No acute osseous abnormality. Minimal first carpometacarpal joint osteoarthritis. Electronically Signed   By: Ilona Sorrel M.D.   On: 07/05/2019 16:58    Scheduled Meds:  amiodarone  100 mg Oral Daily   amLODipine  10 mg Oral Daily   amoxicillin-clavulanate  1 tablet Oral Q12H   apixaban  5 mg Oral BID   atorvastatin  40 mg Oral q1800   bisoprolol  5 mg Oral Daily   cholecalciferol  1,000 Units Oral Daily   clopidogrel  75 mg Oral Daily   diclofenac sodium  2 g Topical QID   docusate sodium  100 mg Oral Daily   folic acid  1 mg Oral Daily   insulin aspart  0-20 Units Subcutaneous TID WC   insulin aspart  0-5 Units Subcutaneous QHS   insulin aspart  8 Units Subcutaneous TID WC   insulin glargine  30 Units Subcutaneous BID   levothyroxine  50 mcg Oral QAC breakfast   multivitamin with minerals  1 tablet Oral Daily   nicotine  21 mg Transdermal Daily   pantoprazole  40 mg Oral Daily   senna-docusate  2 tablet Oral BID   sodium chloride flush  10-40 mL Intracatheter Q12H   thiamine  100 mg Oral Daily   torsemide  40 mg Oral BID   Continuous Infusions:  sodium chloride 10 mL/hr at 06/28/19 2057   sodium chloride      Principal Problem:   Osteomyelitis ([name]) Active Problems:   DM (diabetes mellitus), type 2 with peripheral vascular complications ([name])   Carotid arterial disease ([name])   Coronary artery disease   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   Chronic combined systolic and diastolic CHF (congestive heart failure) ([name])   COPD GOLD 0    CKD (chronic kidney disease), stage III ([name])   CHF (congestive heart failure) ([place])      Myrlene Broker D Leya Paige  [facility]

## 2019-07-08 ENCOUNTER — Inpatient Hospital Stay (HOSPITAL_COMMUNITY): Payer: Medicare Other

## 2019-07-08 DIAGNOSIS — E11628 Type 2 diabetes mellitus with other skin complications: Secondary | ICD-10-CM

## 2019-07-08 DIAGNOSIS — L03119 Cellulitis of unspecified part of limb: Secondary | ICD-10-CM

## 2019-07-08 DIAGNOSIS — E039 Hypothyroidism, unspecified: Secondary | ICD-10-CM

## 2019-07-08 DIAGNOSIS — I739 Peripheral vascular disease, unspecified: Secondary | ICD-10-CM

## 2019-07-08 DIAGNOSIS — I10 Essential (primary) hypertension: Secondary | ICD-10-CM

## 2019-07-08 DIAGNOSIS — I480 Paroxysmal atrial fibrillation: Secondary | ICD-10-CM

## 2019-07-08 DIAGNOSIS — Z9889 Other specified postprocedural states: Secondary | ICD-10-CM

## 2019-07-08 DIAGNOSIS — F1010 Alcohol abuse, uncomplicated: Secondary | ICD-10-CM

## 2019-07-08 DIAGNOSIS — G63 Polyneuropathy in diseases classified elsewhere: Secondary | ICD-10-CM

## 2019-07-08 HISTORY — DX: Cellulitis of unspecified part of limb: E11.628

## 2019-07-08 HISTORY — DX: Cellulitis of unspecified part of limb: L03.119

## 2019-07-08 LAB — GLUCOSE, CAPILLARY
Glucose-Capillary: 93 mg/dL (ref 70–99)
Glucose-Capillary: 203 mg/dL — ABNORMAL HIGH (ref 70–99)
Glucose-Capillary: 121 mg/dL — ABNORMAL HIGH (ref 70–99)

## 2019-07-08 MED ORDER — METHOCARBAMOL 500 MG PO TABS: 500.0000 mg | ORAL_TABLET | Freq: Three times a day (TID) | ORAL | Status: DC | PRN

## 2019-07-08 MED ORDER — TRAZODONE HCL 50 MG PO TABS: 50.0000 mg | ORAL_TABLET | Freq: Every evening | ORAL | Status: DC | PRN

## 2019-07-08 MED ORDER — BISOPROLOL FUMARATE 5 MG PO TABS: 5.0000 mg | ORAL_TABLET | Freq: Every day | ORAL | 5 refills | Status: DC

## 2019-07-08 MED ORDER — ALBUTEROL SULFATE HFA 108 (90 BASE) MCG/ACT IN AERS: 2.0000 | INHALATION_SPRAY | Freq: Four times a day (QID) | RESPIRATORY_TRACT | 0 refills | Status: DC | PRN

## 2019-07-08 MED ORDER — TRAZODONE HCL 50 MG PO TABS: 50.0000 mg | ORAL_TABLET | Freq: Every evening | ORAL | 0 refills | Status: DC | PRN

## 2019-07-08 MED ORDER — THIAMINE HCL 100 MG PO TABS: 100.0000 mg | ORAL_TABLET | Freq: Every day | ORAL | 0 refills | Status: DC

## 2019-07-08 MED ORDER — SENNOSIDES-DOCUSATE SODIUM 8.6-50 MG PO TABS: 2.0000 | ORAL_TABLET | Freq: Every evening | ORAL | Status: DC | PRN

## 2019-07-08 MED ORDER — AMIODARONE HCL 100 MG PO TABS: 100.0000 mg | ORAL_TABLET | Freq: Every day | ORAL | Status: DC

## 2019-07-08 MED ORDER — HYDROCODONE-ACETAMINOPHEN 5-325 MG PO TABS: 1.0000 | ORAL_TABLET | Freq: Four times a day (QID) | ORAL | 0 refills | Status: DC | PRN

## 2019-07-08 MED ORDER — ACETAMINOPHEN 500 MG PO TABS: 1000.0000 mg | ORAL_TABLET | Freq: Four times a day (QID) | ORAL | 0 refills | Status: DC | PRN

## 2019-07-08 MED ORDER — METHOCARBAMOL 500 MG PO TABS: 500.0000 mg | ORAL_TABLET | Freq: Three times a day (TID) | ORAL | 0 refills | Status: DC | PRN

## 2019-07-08 MED ORDER — INSULIN GLARGINE 100 UNIT/ML ~~LOC~~ SOLN
25.0000 [IU] | Freq: Two times a day (BID) | SUBCUTANEOUS | Status: DC
  Filled 2019-07-08: qty 0.25

## 2019-07-08 MED ORDER — APIXABAN 5 MG PO TABS: 5.0000 mg | ORAL_TABLET | Freq: Two times a day (BID) | ORAL | 0 refills | Status: DC

## 2019-07-08 MED ORDER — HUMALOG KWIKPEN 200 UNIT/ML ~~LOC~~ SOPN: 8.0000 [IU] | PEN_INJECTOR | Freq: Three times a day (TID) | SUBCUTANEOUS | Status: DC

## 2019-07-08 MED ORDER — DICLOFENAC SODIUM 1 % TD GEL: 2.0000 g | Freq: Three times a day (TID) | TRANSDERMAL | Status: DC | PRN

## 2019-07-08 MED ORDER — LOSARTAN POTASSIUM 100 MG PO TABS: 100.0000 mg | ORAL_TABLET | Freq: Every day | ORAL | 5 refills | Status: DC

## 2019-07-08 MED ORDER — PANTOPRAZOLE SODIUM 40 MG PO TBEC: 40.0000 mg | DELAYED_RELEASE_TABLET | Freq: Every day | ORAL | Status: DC

## 2019-07-08 MED ORDER — DOCUSATE SODIUM 100 MG PO CAPS: 100.0000 mg | ORAL_CAPSULE | Freq: Every day | ORAL | 0 refills | Status: DC

## 2019-07-08 MED ORDER — LANTUS SOLOSTAR 100 UNIT/ML ~~LOC~~ SOPN: 30.0000 [IU] | PEN_INJECTOR | Freq: Every day | SUBCUTANEOUS | 11 refills | Status: DC

## 2019-07-08 MED ORDER — AMLODIPINE BESYLATE 10 MG PO TABS: 10.0000 mg | ORAL_TABLET | Freq: Every day | ORAL | 0 refills | Status: DC

## 2019-07-08 MED ORDER — TORSEMIDE 20 MG PO TABS: 40.0000 mg | ORAL_TABLET | Freq: Two times a day (BID) | ORAL | 0 refills | Status: DC

## 2019-07-08 MED ORDER — NICOTINE 21 MG/24HR TD PT24: 21.0000 mg | MEDICATED_PATCH | Freq: Every day | TRANSDERMAL | 0 refills | Status: DC

## 2019-07-08 MED ORDER — GUAIFENESIN-DM 100-10 MG/5ML PO SYRP: 15.0000 mL | ORAL_SOLUTION | ORAL | 0 refills | Status: DC | PRN

## 2019-07-08 MED ORDER — VITAMIN D 1000 UNITS PO TABS: 1000.0000 [IU] | ORAL_TABLET | Freq: Every day | ORAL | Status: DC

## 2019-07-08 MED ORDER — FOLIC ACID 1 MG PO TABS: 1.0000 mg | ORAL_TABLET | Freq: Every day | ORAL | 0 refills | Status: DC

## 2019-07-08 MED ORDER — ATORVASTATIN CALCIUM 40 MG PO TABS: ORAL_TABLET | ORAL | 0 refills | Status: DC

## 2019-07-08 MED ORDER — CLOPIDOGREL BISULFATE 75 MG PO TABS: 75.0000 mg | ORAL_TABLET | Freq: Every day | ORAL | Status: DC

## 2019-07-08 NOTE — Progress Notes (Signed)
D/C instructions and prescriptions reviewed with patient. All questions answered. Report called to RN at [facility]. PTAR to escort pt to facility.  Clyde Canterbury, RN

## 2019-07-08 NOTE — Progress Notes (Signed)
Physical Therapy Treatment Patient Details Name: [name] MRN: [id number] DOB: [birth year] Today's Date: 07/08/2019    History of Present Illness Pt is a 59 y.o. 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 past medical history of diabetes mellitus, tobacco abuse, still smoking, CAD, peripheral vascular disease s/p angiogram and stent, A. Fib, COPD, cirrhosis, OSA, CKD stage III, hypertension    PT Comments    Patient received in recliner, requiring assistance to open creamer for coffee. Tearful due to B foot pain, right wrist pain, but agrees to PT session. Patient requires min guard for transfers sit to stand from recliner with 3 attempts due to inability to use right UE to assist. Patient then ambulated 70 feet with RW, min guard. After seated rest, she was able to walk another 30 feet and used restroom with min guard assist. Patient very tearful this session reporting horrible pain in B feet and right wrist. Patient will continue to benefit from skilled PT to improve mobility, decrease pain and improve safety.      Follow Up Recommendations  SNF     Equipment Recommendations  None recommended by PT    Recommendations for Other Services       Precautions / Restrictions Precautions Precautions: Fall Restrictions Weight Bearing Restrictions: Yes LLE Weight Bearing: Weight bearing as tolerated Other Position/Activity Restrictions: post op shoe    Mobility  Bed Mobility               General bed mobility comments: in recliner  Transfers Overall transfer level: Needs assistance Equipment used: Rolling walker (2 wheeled) Transfers: Sit to/from Stand Sit to Stand: Min guard         General transfer comment: unable to use R UE to assist with sit>stand, min assist to rise and steady  Ambulation/Gait Ambulation/Gait  assistance: Min guard Gait Distance (Feet): 100 Feet Assistive device: Rolling walker (2 wheeled) Gait Pattern/deviations: Step-through pattern;Decreased step length - right;Decreased step length - left;Decreased stride length;Shuffle Gait velocity: decreased   General Gait Details: patient very anxious, tearful, limited by pain   Stairs             Wheelchair Mobility    Modified Rankin (Stroke Patients Only)       Balance Overall balance assessment: Needs assistance Sitting-balance support: Feet supported Sitting balance-Leahy Scale: Good     Standing balance support: Bilateral upper extremity supported;During functional activity Standing balance-Leahy Scale: Fair Standing balance comment: can release walker in static standing, but reliant for ambulation                            Cognition Arousal/Alertness: Awake/alert Behavior During Therapy: WFL for tasks assessed/performed Overall Cognitive Status: Within Functional Limits for tasks assessed                                 General Comments: patient tearful this session, multiple reports of pain in various areas.      Exercises Other Exercises Other Exercises: LAQ in sitting B 5x on left, 10 on right    General Comments        Pertinent Vitals/Pain Pain Assessment: Faces Faces Pain Scale: Hurts worst Pain Location: R hand, R foot, L Foot Pain Descriptors / Indicators: Burning;Pins and needles;Crying Pain Intervention(s):  Limited activity within patient's tolerance;Monitored during session    Home Living                      Prior Function            PT Goals (current goals can now be found in the care plan section) Acute Rehab PT Goals Patient Stated Goal: "go to rehab first instead of home" PT Goal Formulation: With patient Time For Goal Achievement: 07/13/19 Potential to Achieve Goals: Good Progress towards PT goals: Progressing toward goals     Frequency    Min 3X/week      PT Plan Current plan remains appropriate    Co-evaluation              AM-PAC PT "6 Clicks" Mobility   Outcome Measure  Help needed turning from your back to your side while in a flat bed without using bedrails?: A Little Help needed moving from lying on your back to sitting on the side of a flat bed without using bedrails?: A Little Help needed moving to and from a bed to a chair (including a wheelchair)?: A Little Help needed standing up from a chair using your arms (e.g., wheelchair or bedside chair)?: A Little Help needed to walk in hospital room?: A Little Help needed climbing 3-5 steps with a railing? : A Lot 6 Click Score: 17    End of Session Equipment Utilized During Treatment: Gait belt Activity Tolerance: Patient limited by pain Patient left: in chair;with call bell/phone within reach Nurse Communication: Mobility status PT Visit Diagnosis: Muscle weakness (generalized) (M62.81);Difficulty in walking, not elsewhere classified (R26.2);Pain Pain - Right/Left: Left(B feet, right wrist) Pain - part of body: Ankle and joints of foot     Time: 1400-1417 PT Time Calculation (min) (ACUTE ONLY): 17 min  Charges:  $Gait Training: 8-22 mins                     Angella Montas, PT, GCS 07/08/19,2:25 PM

## 2019-07-08 NOTE — Progress Notes (Signed)
ABI's have been completed. Preliminary results can be found in CV Proc through chart review.   07/08/19 3:05 PM [name] RVT

## 2019-07-08 NOTE — Discharge Summary (Signed)
[name] [id number] DOB: [birth year] DOA: 06/21/2019  PCP: Tsosie Billing, MD (Inactive)  Admit date: 06/21/2019 Discharge date: 07/08/2019  Admitted From: Home Disposition: [name]  Recommendations for Outpatient Follow-up:  1. Follow up with PCP in 1-2 weeks 2. Please obtain BMP/CBC in one week 3. Medication changes:  1. Losartan, metolazone  held during hospitalization due to well controlled BP off it. Continue to hold on discharge can have BP check at SNF  2. Decrease Lantus PTA  to 30 U qhs, lispro 8 U TID with meals as long as eating greater than 50% 3. Use on PRN basis (volume) home metolazone given stable BP, euvolemic status.  Can use on as needed basis 4. Discontinued Spironolactone and Potassium ( presented with hyperkalemia) 5. Provided short script of robaxin PRN, volatren gel, and norco PRN severe pain (20 tabs) on discharge  4. Vascular surgery will arrange follow-up as outpatient for suture removal 5. Please follow up on the following pending results: Postoperative ABIs  Home Health: SNF Equipment/Devices: None  Discharge Condition: Stable CODE STATUS: Full Diet recommendation: Carb Modified  Brief/Interim Summary: History of present illness:   [name] is a 58 y.o. year old female with medical history significant for diabetes mellitus, tobacco abuse, still smoking, CAD, peripheral vascular disease s/p angiogram and stent by Dr. Gwenlyn Found, A. Fib on Eliquis, COPD, cirrhosis, OSA, CKD stage III, hypertension who presented on 06/21/2019 after being directed to the ED by her PCP due to elevated potassium of 5.9, while on Aldactone, losartan and potassium supplementation as well as poorly healing left fifth toe ulcer with foul smell for at least 3 weeks and was found to have osteomyelitis of left fifth toe.  She was empirically treated with vancomycin (8/9- 8/14, 8/16-8/20 ) and ceftriaxone (8/9-8/15).  Underwent angiogram on 8/12 showing long  occlusion of left SFA with moderate popliteal disease. Underwent left sided fempop bypass left followed by toe ray amputation on 8/14 by vascular surgery.  Blood cultures remain unremarkable.  Vancomycin was discontinued 8/8 but resumed due to worsening leukocytosis (14-22).  Vancomycin was again discontinued on 8/20 after MRSA PCR negative given clean margins on surgery and patient remained afebrile with no worsening leukocytosis.  Started on augmentin on 8/16 for presumed cellulitis near vein graft sites and completed therapy on 8/22.   Prior to discharge underwent postoperative ABIs on 8/24.  Remaining hospital course addressed in problem based format below:   Hospital Course:   Osteomyelitis and gangrene of left fifth toe in known diabetic with poor healing related to PAD, stable.  Status post left fifth toe ray amputation. Remains afebrile.  Pain well controlled.  Vascular following, plan for postoperative ABIs. Vascular to follow up as outpatient for suture removal, will arrange follow up  Nonpurulent cellulitis near vein graft sites.  Does have some slight erythema on anterior leg suspect is more related to poor drainage /venous stasis related to vein graft site removal and not cellulitis.  Completed full 8 day course of augmentin  Right wrist pain.  minimal swelling without fracture on  XR. Better with wrist brace, PRN robaxin, voltaren gel  Critical limb ischemia with gangrenous left fifth toe, status post left femoral to anterior tibial bypass disease( 8/14),.  Underwent attempted endovascular treatment on 8/12 that was unsuccessful prompting femoropopliteal bypass by vascular surgery.  Continue dressing changes as recommended by vascular.Continue plavix and eliquis on discharge  CKD stage III, stable Baseline creatinine seems to fluctuate between 1.3-1.8.  Briefly elevated 2.16 on 8/13, Cr 1.21 on 8/22. Avoid nephrotoxins, monitor output..  Acute on chronic normocytic anemia.   Stable.  Baseline hemoglobin 8-9.  Nadir of 6.6 on this admission requiring transfusion.  hgb remained stable at 7.5-8 after transfusion, Hgb 7.5 on 8/22  Paroxysmal atrial fibrillation, rate controlled.  Continue Eliquis and amiodarone.  Nonischemic cardiomyopathy with EF of 45%, stable.  Normal volume status, continue torsemide on discharge. Hold home metolazone, spironolactone (held due to hyperkalemia on admission), losartan given stable BP, euvolemic status.  Can use on as needed basis  Type 2 diabetes with peripheral neuropathy on insulin, A1c 7.2%. Did ok on Lantus 30 U BID but did have FBG in last 24-48 hours 90-low 100s. Home regimen PTA was Lantus 40qhs, on discharge will modify to 30 U qhs, and scheduled short acting lispro 8 U TID with meals as long as eating greater than 50%  Hypertension, continue bisoprolol, amlodipine, torsemide.  Losartan and Aldactone held during hospitalization due to well controlled BP off it. Continue to hold on discharge can have BP check at SNF  Hypothyroidism. Stable. Continue home synthroid   History of alcohol abuse. No withdrawal here. Continue folic and thiamine on discharge   Consultations:  Cardiology, orthopedics, vascular  Procedures/Studies: Angiogram 06/26/2019 by Dr. Fletcher Anon: 1. No significant aortoiliac disease. 2. Long occlusion of the left SFA with moderate popliteal disease and one-vessel runoff below the knee via the anterior tibial artery which has calcified disease proximally. 3. Unsuccessful endovascular intervention on the left SFA due to inability to cross the occlusion in spite of antegrade and retrograde access   8/14: Left femoral to anterior tibial bypass with non-reversed great saphenous vein and left fifth toe ray amputation Subjective: Having some right hip pain while sitting in chair Wrist doing ok with brace  Discharge Exam: Vitals:   07/08/19 0931 07/08/19 1151  BP: 131/87 (!) 138/48  Pulse:  66  Resp:     Temp:  98 F (36.7 C)  SpO2:  97%   Vitals:   07/07/19 1959 07/08/19 0350 07/08/19 0931 07/08/19 1151  BP: 106/61  131/87 (!) 138/48  Pulse: 73   66  Resp:      Temp: 99 F (37.2 C) 97.9 F (36.6 C)  98 F (36.7 C)  TempSrc: Oral Oral  Oral  SpO2: 97%   97%  Weight:  104.4 kg    Height:        Awake Alert, Oriented X 3, No new F.N deficits, Normal affect [name],PERRAL Symmetrical Chest wall movement, Good air movement bilaterally, CTAB RRR,No Gallops,Rubs or new Murmurs, +ve B.Sounds, Abd Soft, No tenderness, , No rebound - guarding or rigidity. Left foot postoperative site  Left leg(medial) vein graft site  Left leg ( lateral) vein graft site   Neurologic: Grossly no focal neuro deficit.Mental status AAOx3, speech normal, Psychiatric:Appropriate affect, and mood  Discharge Diagnoses:  Principal Problem:   Osteomyelitis ([place]) Active Problems:   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   Alcohol abuse   DM (diabetes mellitus), type 2 with peripheral vascular complications ([name])   Hypothyroidism   PAF (paroxysmal atrial fibrillation) ([name])   Carotid arterial disease ([name])   Coronary artery disease   Chronic combined systolic and diastolic CHF (congestive heart failure) ([name])   Essential hypertension   COPD GOLD 0    CKD (chronic kidney disease), stage III ([name])   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[name])   Peripheral neuropathy   CHF (congestive heart failure) ([name])   NICM (  nonischemic cardiomyopathy) ([place])   Cellulitis in diabetic foot [facility])    Discharge Instructions  Discharge Instructions    Diet - low sodium heart healthy   Complete by: As directed    Increase activity slowly   Complete by: As directed      Allergies as of 07/08/2019      Reactions   Gabapentin Nausea And Vomiting, Other (See Comments)   POSSIBLE SHAKING? TAKING MED CURRENTLY   Lyrica [pregabalin] Other (See Comments)   Shaking Pt still taking lyrica--"probably will stop taking"       Medication  List    STOP taking these medications   BC FAST PAIN RELIEF PO   ibuprofen 600 MG tablet Commonly known as: ADVIL   metolazone 2.5 MG tablet Commonly known as: ZAROXOLYN   potassium chloride SA 20 MEQ tablet Commonly known as: K-DUR   spironolactone 25 MG tablet Commonly known as: ALDACTONE     TAKE these medications   acetaminophen 500 MG tablet Commonly known as: TYLENOL Take 2 tablets (1,000 mg total) by mouth every 6 (six) hours as needed for mild pain.   albuterol 108 (90 Base) MCG/ACT inhaler Commonly known as: VENTOLIN HFA Inhale 2 puffs into the lungs every 6 (six) hours as needed for wheezing or shortness of breath.   amiodarone 100 MG tablet Commonly known as: PACERONE Take 1 tablet (100 mg total) by mouth daily. What changed: See the new instructions.   amLODipine 10 MG tablet Commonly known as: NORVASC Take 1 tablet (10 mg total) by mouth daily.   apixaban 5 MG Tabs tablet Commonly known as: ELIQUIS Take 1 tablet (5 mg total) by mouth 2 (two) times daily.   atorvastatin 40 MG tablet Commonly known as: LIPITOR TAKE 1 TABLET BY MOUTH ONCE EVERY EVENING What changed:   how much to take  how to take this  when to take this   bisoprolol 5 MG tablet Commonly known as: ZEBETA Take 1 tablet (5 mg total) by mouth daily.   cholecalciferol 1000 units tablet Commonly known as: VITAMIN D Take 1 tablet (1,000 Units total) by mouth daily.   clopidogrel 75 MG tablet Commonly known as: PLAVIX Take 1 tablet (75 mg total) by mouth daily.   diclofenac sodium 1 % Gel Commonly known as: VOLTAREN Apply 2 g topically 3 (three) times daily as needed.   docusate sodium 100 MG capsule Commonly known as: COLACE Take 1 capsule (100 mg total) by mouth daily. Start taking on: August 25, [id number]   folic acid 1 MG tablet Commonly known as: FOLVITE Take 1 tablet (1 mg total) by mouth daily.   guaiFENesin-dextromethorphan 100-10 MG/5ML syrup Commonly known as:  ROBITUSSIN DM Take 15 mLs by mouth every 4 (four) hours as needed for cough.   HumaLOG KwikPen 200 UNIT/ML Sopn Generic drug: Insulin Lispro Inject 8 Units into the skin 3 (three) times daily with meals. What changed: how much to take   HYDROcodone-acetaminophen 5-325 MG tablet Commonly known as: NORCO/VICODIN Take 1-2 tablets by mouth every 6 (six) hours as needed for severe pain.   Lantus SoloStar 100 UNIT/ML Solostar Pen Generic drug: Insulin Glargine Inject 30 Units into the skin at bedtime. What changed: how much to take   levothyroxine 50 MCG tablet Commonly known as: SYNTHROID Take 1 tablet (50 mcg total) by mouth daily.   losartan 100 MG tablet Commonly known as: COZAAR Take 1 tablet (100 mg total) by mouth daily. HOLD until BP and BMP check What  changed: additional instructions   methocarbamol 500 MG tablet Commonly known as: ROBAXIN Take 1 tablet (500 mg total) by mouth 3 (three) times daily as needed for muscle spasms.   multivitamin with minerals Tabs tablet Take 1 tablet by mouth daily.   nicotine 21 mg/24hr patch Commonly known as: NICODERM CQ - dosed in mg/24 hours Place 1 patch (21 mg total) onto the skin daily. Start taking on: July 09, 2019   pantoprazole 40 MG tablet Commonly known as: PROTONIX Take 1 tablet (40 mg total) by mouth daily. Start taking on: July 09, 2019   senna-docusate 8.6-50 MG tablet Commonly known as: Senokot-S Take 2 tablets by mouth at bedtime as needed for mild constipation.   thiamine 100 MG tablet Take 1 tablet (100 mg total) by mouth daily.   torsemide 20 MG tablet Commonly known as: DEMADEX Take 2 tablets (40 mg total) by mouth 2 (two) times daily.   traZODone 50 MG tablet Commonly known as: DESYREL Take 1 tablet (50 mg total) by mouth at bedtime as needed for sleep.            Durable Medical Equipment  (From admission, onward)         Start     Ordered   07/01/19 0733  For home use only DME 3 n 1   Once     07/01/19 0732   06/30/19 0955  For home use only DME 3 n 1  Once     06/30/19 [id number]         Follow-up Information    Early, Arvilla Meres, MD Follow up in 2 week(s).   Specialties: Vascular Surgery, Cardiology Why: office will call Contact information: [address] [place] [address] [phone]          Allergies  Allergen Reactions  . Gabapentin Nausea And Vomiting and Other (See Comments)    POSSIBLE SHAKING? TAKING MED CURRENTLY  . Lyrica [Pregabalin] Other (See Comments)    Shaking Pt still taking lyrica--"probably will stop taking"         The results of significant diagnostics from this hospitalization (including imaging, microbiology, ancillary and laboratory) are listed below for reference.     Microbiology: Recent Results (from the past 240 hour(s))  Novel Coronavirus, NAA (hospital order; send-out to ref lab)     Status: None   Collection Time: 07/05/19  2:58 PM   Specimen: Nasopharyngeal Swab; Respiratory  Result Value Ref Range Status   SARS-CoV-2, NAA NOT DETECTED NOT DETECTED Final    Comment: (NOTE) This test was developed and its performance characteristics determined by [employer]. This test has not been FDA cleared or approved. This test has been authorized by FDA under an Emergency Use Authorization (EUA). This test is only authorized for the duration of time the declaration that circumstances exist justifying the authorization of the emergency use of in vitro diagnostic tests for detection of SARS-CoV-2 virus and/or diagnosis of COVID-19 infection under section 564(b)(1) of the Act, 21 U.S.C. [id number]), unless the authorization is terminated or revoked sooner. When diagnostic testing is negative, the possibility of a false negative result should be considered in the context of a patient's recent exposures and the presence of clinical signs and symptoms consistent with COVID-19. An individual without symptoms of  COVID-19 and who is not shedding SARS-CoV-2 virus would expect to have a negative (not detected) result in this assay. Performed  At: [facility] [place], [place] [id number] Rush Farmer MD  [id number]    Coronavirus Source NASOPHARYNGEAL  Final    Comment: Performed at [facility], [place] [address] [place], [place] [address]     Labs: BNP (last 3 results) Recent Labs    10/18/18 1109 10/21/18 0125 11/27/18 0519  BNP 410.0* 548.1* [id number]*   Basic Metabolic Panel: Recent Labs  Lab 07/02/19 0304 07/03/19 0641 07/04/19 0300 07/06/19 0254  NA 135 133* 133* 137  K 3.4* 3.9 3.7 3.7  CL 94* 94* 94* 97*  CO2 29 27 27 27   GLUCOSE 196* 159* 173* 185*  BUN 70* 77* 71* 49*  CREATININE 1.36* 1.59* 1.39* 1.21*  CALCIUM 8.4* 8.4* 8.0* 8.2*   Liver Function Tests: No results for input(s): AST, ALT, ALKPHOS, BILITOT, PROT, ALBUMIN in the last 168 hours. No results for input(s): LIPASE, AMYLASE in the last 168 hours. No results for input(s): AMMONIA in the last 168 hours. CBC: Recent Labs  Lab 07/02/19 0304 07/03/19 0641 07/04/19 1001 07/05/19 0841 07/06/19 0254  WBC 16.9* 14.9* 14.6* 14.5* 12.3*  HGB 7.4* 8.3* 7.9* 7.7* 7.5*  HCT 22.9* 26.1* 25.2* 24.7* 24.4*  MCV 92.0 93.9 93.3 93.6 95.3  PLT 331 431* 441* 464* 468*   Cardiac Enzymes: No results for input(s): CKTOTAL, CKMB, CKMBINDEX, TROPONINI in the last 168 hours. BNP: Invalid input(s): POCBNP CBG: Recent Labs  Lab 07/07/19 1125 07/07/19 1605 07/07/19 2114 07/08/19 0621 07/08/19 1149  GLUCAP 230* 69* 276* 93 203*   D-Dimer No results for input(s): DDIMER in the last 72 hours. Hgb A1c No results for input(s): HGBA1C in the last 72 hours. Lipid Profile No results for input(s): CHOL, HDL, LDLCALC, TRIG, CHOLHDL, LDLDIRECT in the last 72 hours. Thyroid function studies No results for input(s): TSH, T4TOTAL, T3FREE, THYROIDAB in the last 72 hours.  Invalid input(s): FREET3  Anemia work up No results for input(s): VITAMINB12, FOLATE, FERRITIN, TIBC, IRON, RETICCTPCT in the last 72 hours. Urinalysis    Component Value Date/Time   COLORURINE YELLOW 03/18/2019 1540   APPEARANCEUR CLEAR 03/18/2019 1540   LABSPEC 1.013 03/18/2019 1540   PHURINE 5.0 03/18/2019 1540   GLUCOSEU NEGATIVE 03/18/2019 1540   HGBUR NEGATIVE 03/18/2019 1540   BILIRUBINUR NEGATIVE 03/18/2019 1540   KETONESUR NEGATIVE 03/18/2019 1540   PROTEINUR NEGATIVE 03/18/2019 1540   UROBILINOGEN 0.2 09/05/2015 0012   NITRITE NEGATIVE 03/18/2019 1540   LEUKOCYTESUR NEGATIVE 03/18/2019 1540   Sepsis Labs Invalid input(s): PROCALCITONIN,  WBC,  LACTICIDVEN Microbiology Recent Results (from the past 240 hour(s))  Novel Coronavirus, NAA (hospital order; send-out to ref lab)     Status: None   Collection Time: 07/05/19  2:58 PM   Specimen: Nasopharyngeal Swab; Respiratory  Result Value Ref Range Status   SARS-CoV-2, NAA NOT DETECTED NOT DETECTED Final    Comment: (NOTE) This test was developed and its performance characteristics determined by [employer]. This test has not been FDA cleared or approved. This test has been authorized by FDA under an Emergency Use Authorization (EUA). This test is only authorized for the duration of time the declaration that circumstances exist justifying the authorization of the emergency use of in vitro diagnostic tests for detection of SARS-CoV-2 virus and/or diagnosis of COVID-19 infection under section 564(b)(1) of the Act, 21 U.S.C. [id number]), unless the authorization is terminated or revoked sooner. When diagnostic testing is negative, the possibility of a false negative result should be considered in the context of a patient's recent exposures and the presence of clinical signs and symptoms consistent with  COVID-19. An individual without symptoms of COVID-19 and who is not shedding SARS-CoV-2 virus would expect to have a negative (not  detected) result in this assay. Performed  At: [facility] [address] [place], [place] [id number] Rush Farmer MD [id number]    [place]  Final    Comment: Performed at [facility], [place] [address] [place], [place] [address]     Time coordinating discharge: Over 30 minutes  SIGNED:   Desiree Hane, MD  [facility] 07/08/2019, 3:31 PM Pager   If 7PM-7AM, please contact night-coverage [email or web address] Password [email or web address]

## 2019-07-08 NOTE — Progress Notes (Addendum)
[facility]   Subjective  - Right wrist support is helping.  No new complaints with the left LE.   Objective 106/61 73 97.9 F (36.6 C) (Oral) 16 97%  Intake/Output Summary (Last 24 hours) at 07/08/2019 0726 Last data filed at 07/07/2019 1300 Gross per 24 hour  Intake 440 ml  Output -  Net 440 ml    Palpable lateral bypass pulse, Doppler AT/peroneal signals 5th toe amp site healing Minimal drainage medial thigh incisions all still intact.  Dry 4 x 4 in place over groin incision.  Assessment/Planning: Left femoral to anterior tibial bypass with non-reversed great saphenous vein and left fifth toe ray amputation  Patent bypass left LE healing 5 th toe amp site. Stable from a vascular point of view.  Plan to remove left foot sutures between 2-3 weeks out. Ordered post op ABI's  Roxy Horseman 07/08/2019 7:26 AM --  Laboratory Lab Results: Recent Labs    07/05/19 0841 07/06/19 0254  WBC 14.5* 12.3*  HGB 7.7* 7.5*  HCT 24.7* 24.4*  PLT 464* 468*   BMET Recent Labs    07/06/19 0254  NA 137  K 3.7  CL 97*  CO2 27  GLUCOSE 185*  BUN 49*  CREATININE 1.21*  CALCIUM 8.2*    COAG Lab Results  Component Value Date   INR 1.1 06/28/2019   INR 1.0 03/28/2018   INR 1.21 04/28/2017   No results found for: PTT  I have examined the patient, reviewed and agree with above.  Stable postop day 10.  Easily palpable lateral femoral to anterior tibial graft pulse.  Fifth toe amputation healing nicely.  No active vascular issues.  I will be out of town for the next week.  Please call my partners for concerns.  I will see the patient in the hospital for suture removal from the left fifth toe amputation  Curt Jews, MD 07/08/2019 1:19 PM

## 2019-07-08 NOTE — Care Management Important Message (Signed)
Important Message  Patient Details  Name: KURSTYN NORTHRIP MRN: [id number] Date of Birth: 1961-05-27   Medicare Important Message Given:  Yes     Shelda Altes 07/08/2019, 12:59 PM

## 2019-07-08 NOTE — Progress Notes (Signed)
PT Cancellation Note  Patient Details Name: [name] MRN: [id number] DOB: 1961-01-28   Cancelled Treatment:    Reason Eval/Treat Not Completed: Patient declined, no reason specified- patient reported she would like to wait, she had just received pain medicine and is sleepy. Will return later as time allows.    [name] 07/08/2019, 12:02 PM

## 2019-07-08 NOTE — Progress Notes (Signed)
[facility]  PROGRESS NOTE  [name] Price [id number] DOB: October 22, 1961 DOA: 06/21/2019 PCP: Tsosie Billing, MD (Inactive)  Brief History    [name] Price is a 58 y.o. year old female with medical history significant for  diabetes mellitus, tobacco abuse, still smoking, CAD, peripheral vascular disease s/p angiogram and stent by Dr. Gwenlyn Found, A. Fib on Eliquis, COPD, cirrhosis, OSA, CKD stage III, hypertension who presented on 06/21/2019 after being directed to the ED by her PCP due to elevated potassium of 5.9, while on Aldactone, losartan and potassium supplementation as well as poorly healing left fifth toe ulcer with foul smell for at least 3 weeks and was found to have osteomyelitis of left fifth toe.  Empirically treated with vancomycin (8/9- 8/14, 06/30/2019) and ceftriaxone (8/9-8/15).  Underwent left toe ray amputation on 8/14.  Blood cultures remain unremarkable.  Vancomycin was being discontinued 8/8 but resumed due to worsening leukocytosis (14-22).  Vanco was discontinued on 8/21 after MRSA PCR negative given clean margins on surgery.  Patient has remained afebrile.  Started on augmentin on 8/16 for presumed cellulitis near vein graft sites.  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  Status post left fifth toe ray amputation remains afebrile.  Pain well controlled.  Vascular following, plan for postoperative ABIs.  Nonpurulent cellulitis near vein graft sites.  Does have some slight erythema on anterior leg suspect is more related to poor drainage /venous stasis related to vein graft site removal and not cellulitis.  Been on Augmentin since 8/16, today is day 9 of antibiotics were discontinued, closely monitor.  Right wrist pain.  minimal swelling without fracture on  XR. Better with wrist brace, PRN robaxin, voltaren gel  Critical limb ischemia with gangrenous left fifth toe, status post left femoral  to anterior tibial bypass disease( 8/14),.  Underwent attempted endovascular treatment on 8/12 that was unsuccessful prompting bypass.  Continue dressing changes as recommended by vascular.  CKD stage III, stable Baseline creatinine seems to fluctuate between 1.3-1.8.  Briefly elevated 2.16 on 8/13, currently stable within baseline.  Avoid nephrotoxins, monitor output..  Acute on chronic normocytic anemia.  Stable.  Baseline hemoglobin 8-9.  Nadir of 6.6 on this admission.  Currently around 8, transfuse goal hemoglobin less than 7  Paroxysmal atrial fibrillation, rate controlled.  Continue Eliquis and amiodarone.  Nonischemic cardiomyopathy with EF of 45%, stable.  Normal volume status, close monitor.  Daily weights, holding home metolazone, torsemide, spironolactone, losartan  Type 2 diabetes with peripheral neuropathy on insulin, A1c 7.2%. FBG in last 24-48 hours 90-low 100s. Home regimen is Lantus 40qhs, currently on 30 U BID to 25 U BID, for postprandial hyperglycemia-8 units 3 times daily with meals (this is half her home regimen with meals), appreciate diabetes coordinator recommendations, sliding scale as needed.  Hypertension, continue amlodipine/torsemide.  Losartan and Aldactone held due to well-controlled BP will discontinue on discharge.   DVT prophylaxis: eliquis Code Status: FULL Family Communication: No family at bedside Disposition Plan: PT recommends SNF- C/m arranging  insurance auth (COVID testing negative) insurance denied CIR> medically stable for discharge    [facility] Direct contact: see [email or web address] (further directions at bottom of note if needed) 7PM-7AM contact night coverage as at bottom of note 07/08/2019, 11:38 AM  LOS: 17 days   Consultants  . Cardiology, orthopedics, vascular  Procedures  . 8/14: Left femoral to anterior tibial bypass with non-reversed great  saphenous vein and left fifth toe ray amputation  Antibiotics  . Vancomycin . Augmentin   Interval History/Subjective  Having some right hip pain while sitting in chair Wrist doing ok with brace  Objective   Vitals:  Vitals:   07/08/19 0350 07/08/19 0931  BP:  131/87  Pulse:    Resp:    Temp: 97.9 F (36.6 C)   SpO2:      Exam:  Awake Alert, Oriented X 3, No new F.N deficits, Normal affect [place].AT,PERRAL Symmetrical Chest wall movement, Good air movement bilaterally, CTAB RRR,No Gallops,Rubs or new Murmurs, +ve B.Sounds, Abd Soft, No tenderness, , No rebound - guarding or rigidity. Left leg ( lateral, vein graft site):    Left leg (medial side, vein graft site)   Left foot(operative site)    Post surgical site healing with minimal drainage on left lateral foot. Vein graft sites healing well with serosanguinous drainage on anterior left leg with some surrounding erythema that blanches  Right wrist with some swelling on medial side and slight tenderness,has on wrist brace  I have personally reviewed the following:   Data Reviewed: Basic Metabolic Panel: Recent Labs  Lab 07/02/19 0304 07/03/19 0641 07/04/19 0300 07/06/19 0254  NA 135 133* 133* 137  K 3.4* 3.9 3.7 3.7  CL 94* 94* 94* 97*  CO2 29 27 27 27   GLUCOSE 196* 159* 173* 185*  BUN 70* 77* 71* 49*  CREATININE 1.36* 1.59* 1.39* 1.21*  CALCIUM 8.4* 8.4* 8.0* 8.2*   Liver Function Tests: No results for input(s): AST, ALT, ALKPHOS, BILITOT, PROT, ALBUMIN in the last 168 hours. No results for input(s): LIPASE, AMYLASE in the last 168 hours. No results for input(s): AMMONIA in the last 168 hours. CBC: Recent Labs  Lab 07/02/19 0304 07/03/19 0641 07/04/19 1001 07/05/19 0841 07/06/19 0254  WBC 16.9* 14.9* 14.6* 14.5* 12.3*  HGB 7.4* 8.3* 7.9* 7.7* 7.5*  HCT 22.9* 26.1* 25.2* 24.7* 24.4*  MCV 92.0 93.9 93.3 93.6 95.3  PLT 331 431* 441* 464* 468*   Cardiac Enzymes: No results for input(s): CKTOTAL, CKMB, CKMBINDEX, TROPONINI in the last 168 hours. BNP (last 3 results) Recent Labs     10/18/18 1109 10/21/18 0125 11/27/18 0519  BNP 410.0* 548.1* 511.3*    ProBNP (last 3 results) No results for input(s): PROBNP in the last 8760 hours.  CBG: Recent Labs  Lab 07/07/19 0620 07/07/19 1125 07/07/19 1605 07/07/19 2114 07/08/19 0621  GLUCAP 88 230* 69* 276* 93    Recent Results (from the past 240 hour(s))  Novel Coronavirus, NAA (hospital order; send-out to ref lab)     Status: None   Collection Time: 07/05/19  2:58 PM   Specimen: Nasopharyngeal Swab; Respiratory  Result Value Ref Range Status   SARS-CoV-2, NAA NOT DETECTED NOT DETECTED Final    Comment: (NOTE) This test was developed and its performance characteristics determined by [employer]. This test has not been FDA cleared or approved. This test has been authorized by FDA under an Emergency Use Authorization (EUA). This test is only authorized for the duration of time the declaration that circumstances exist justifying the authorization of the emergency use of in vitro diagnostic tests for detection of SARS-CoV-2 virus and/or diagnosis of COVID-19 infection under section 564(b)(1) of the Act, 21 U.S.C. KA:123727), unless the authorization is terminated or revoked sooner. When diagnostic testing is negative, the possibility of a false negative result should be considered in the context of a patient's recent exposures and the presence  of clinical signs and symptoms consistent with COVID-19. An individual without symptoms of COVID-19 and who is not shedding SARS-CoV-2 virus would expect to have a negative (not detected) result in this assay. Performed  At: [facility] [address] [place], [place] [id number] Rush Farmer MD [id number]    [place]  Final    Comment: Performed at [facility], [place] [address], [place], [place] [address]     Studies: No results found.  Scheduled Meds: . amiodarone  100 mg Oral Daily  . amLODipine  10 mg  Oral Daily  . amoxicillin-clavulanate  1 tablet Oral Q12H  . apixaban  5 mg Oral BID  . atorvastatin  40 mg Oral q1800  . bisoprolol  5 mg Oral Daily  . cholecalciferol  1,000 Units Oral Daily  . clopidogrel  75 mg Oral Daily  . diclofenac sodium  2 g Topical QID  . docusate sodium  100 mg Oral Daily  . folic acid  1 mg Oral Daily  . insulin aspart  0-20 Units Subcutaneous TID WC  . insulin aspart  0-5 Units Subcutaneous QHS  . insulin aspart  8 Units Subcutaneous TID WC  . insulin glargine  30 Units Subcutaneous BID  . levothyroxine  50 mcg Oral QAC breakfast  . multivitamin with minerals  1 tablet Oral Daily  . nicotine  21 mg Transdermal Daily  . pantoprazole  40 mg Oral Daily  . senna-docusate  2 tablet Oral BID  . sodium chloride flush  10-40 mL Intracatheter Q12H  . thiamine  100 mg Oral Daily  . torsemide  40 mg Oral BID   Continuous Infusions: . sodium chloride 10 mL/hr at 06/28/19 2057  . sodium chloride      Principal Problem:   Osteomyelitis ([name]) Active Problems:   DM (diabetes mellitus), type 2 with peripheral vascular complications ([name])   Carotid arterial disease ([name])   Coronary artery disease   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   Chronic combined systolic and diastolic CHF (congestive heart failure) ([name])   COPD GOLD 0    CKD (chronic kidney disease), stage III ([name])   CHF (congestive heart failure) ([place])      Desiree Hane  [facility]

## 2019-07-08 NOTE — TOC Transition Note (Signed)
Transition of Care [facility]) - CM/SW Discharge Note   Patient Details  Name: [name] MRN: [id number] Date of Birth: [birth year]  Transition [facility] CM/SW Contact:  Vinie Sill, [place] Phone Number: 07/08/2019, 4:42 PM   Clinical Narrative:     Patient will DC to: [facility] Date: 07/08/2019 Family Notified: [name] (pateint requested to call roommate) Transport By: Karla Harold  RN, patient, and facility notified of DC. Discharge Summary sent to facility. RN given number for [phone], [facility]. Ambulance transport requested for patient.   Clinical Social Worker signing off. [name], MSW, [facility] Clinical Social Worker [phone]    Final next level of care: Skilled Nursing Facility Barriers to Discharge: Barriers Resolved   Patient Goals and CMS Choice Patient states their goals for this hospitalization and ongoing recovery are:: to get better and go home CMS Medicare.gov Compare Post Acute Care list provided to:: Patient Choice offered to / list presented to : Patient  Discharge Placement PASRR number recieved: 07/03/19            Patient chooses bed at: [facility] Patient to be transferred to facility by: [place] Name of family member notified: per pateint's request her roommate [name] Patient and family notified of of transfer: 07/08/19  Discharge Plan and Services In-house Referral: Clinical Social Work Discharge Planning Services: [employer] Consult Post Acute Care Choice: Home Health, Durable Medical Equipment          DME Arranged: 3-N-1         [place] Arranged: PT [place] Agency: [facility] [facility] [employer] Date [place]: 06/30/19 Time [place]: 1009 Representative spoke with at [place]: [place] ([place]) Interventions     Readmission Risk Interventions No flowsheet data found.

## 2019-07-18 ENCOUNTER — Telehealth (HOSPITAL_COMMUNITY): Payer: Self-pay

## 2019-07-18 NOTE — Telephone Encounter (Signed)
I called pt to see if she was still in rehab. Pt said she possibly may be going home next week. I advised her to call me once she gets home and we can sch a home visit and ensure her med changes gets done in her bubble packs.   Marylouise Stacks, EMT-Paramedic  07/18/19

## 2019-07-23 ENCOUNTER — Ambulatory Visit (INDEPENDENT_AMBULATORY_CARE_PROVIDER_SITE_OTHER): Payer: Self-pay | Admitting: Vascular Surgery

## 2019-07-23 ENCOUNTER — Other Ambulatory Visit: Payer: Self-pay

## 2019-07-23 ENCOUNTER — Encounter: Payer: Self-pay | Admitting: Vascular Surgery

## 2019-07-23 VITALS — BP 154/68 | HR 76 | Temp 97.3°F | Resp 20 | Ht 64.0 in | Wt 230.0 lb

## 2019-07-23 DIAGNOSIS — I739 Peripheral vascular disease, unspecified: Secondary | ICD-10-CM

## 2019-07-23 NOTE — Progress Notes (Signed)
Patient name: [name] MRN: [id number] DOB: 02-22-1961 Sex: female  REASON FOR VISIT: Follow-up left femoral to anterior tibial bypass and left fifth toe amputation  HPI: [name] is a 58 y.o. female today for follow-up from bypass and toe amputation on 06/28/2019.  She had other medical issues and was discharged to [facility].  She is here today for follow-up.  She looks quite good.  She reports minimal discomfort.  Current Outpatient Medications  Medication Sig Dispense Refill  . acetaminophen (TYLENOL) 500 MG tablet Take 2 tablets (1,000 mg total) by mouth every 6 (six) hours as needed for mild pain. 30 tablet 0  . albuterol (VENTOLIN HFA) 108 (90 Base) MCG/ACT inhaler Inhale 2 puffs into the lungs every 6 (six) hours as needed for wheezing or shortness of breath. 18 g 0  . amiodarone (PACERONE) 100 MG tablet Take 1 tablet (100 mg total) by mouth daily.    [name] amLODipine (NORVASC) 10 MG tablet Take 1 tablet (10 mg total) by mouth daily. 90 tablet 0  . apixaban (ELIQUIS) 5 MG TABS tablet Take 1 tablet (5 mg total) by mouth 2 (two) times daily. 60 tablet 0  . atorvastatin (LIPITOR) 40 MG tablet TAKE 1 TABLET BY MOUTH ONCE EVERY EVENING 30 tablet 0  . bisoprolol (ZEBETA) 5 MG tablet Take 1 tablet (5 mg total) by mouth daily. 30 tablet 5  . cholecalciferol (VITAMIN D) 1000 units tablet Take 1 tablet (1,000 Units total) by mouth daily.    . clopidogrel (PLAVIX) 75 MG tablet Take 1 tablet (75 mg total) by mouth daily.    . diclofenac sodium (VOLTAREN) 1 % GEL Apply 2 g topically 3 (three) times daily as needed.    . docusate sodium (COLACE) 100 MG capsule Take 1 capsule (100 mg total) by mouth daily. 10 capsule 0  . doxycycline (VIBRAMYCIN) 100 MG capsule Take 100 mg by mouth 2 (two) times daily.    . folic acid (FOLVITE) 1 MG tablet Take 1 tablet (1 mg total) by mouth daily. 30 tablet 0  . guaiFENesin-dextromethorphan (ROBITUSSIN DM) 100-10 MG/5ML  syrup Take 15 mLs by mouth every 4 (four) hours as needed for cough. 118 mL 0  . HYDROcodone-acetaminophen (NORCO/VICODIN) 5-325 MG tablet Take 1-2 tablets by mouth every 6 (six) hours as needed for severe pain. 20 tablet 0  . Insulin Glargine (LANTUS SOLOSTAR) 100 UNIT/ML Solostar Pen Inject 30 Units into the skin at bedtime. 15 mL 11  . Insulin Lispro (HUMALOG KWIKPEN) 200 UNIT/ML SOPN Inject 8 Units into the skin 3 (three) times daily with meals.    [name] levothyroxine (SYNTHROID, LEVOTHROID) 50 MCG tablet Take 1 tablet (50 mcg total) by mouth daily. 30 tablet 0  . losartan (COZAAR) 100 MG tablet Take 1 tablet (100 mg total) by mouth daily. HOLD until BP and BMP check 30 tablet 5  . methocarbamol (ROBAXIN) 500 MG tablet Take 1 tablet (500 mg total) by mouth 3 (three) times daily as needed for muscle spasms. 45 tablet 0  . Multiple Vitamin (MULTIVITAMIN WITH MINERALS) TABS tablet Take 1 tablet by mouth daily. 30 tablet 0  . nicotine (NICODERM CQ - DOSED IN MG/24 HOURS) 21 mg/24hr patch Place 1 patch (21 mg total) onto the skin daily. 28 patch 0  . pantoprazole (PROTONIX) 40 MG tablet Take 1 tablet (40 mg total) by mouth daily.    [name] senna-docusate (SENOKOT-S) 8.6-50 MG tablet Take 2 tablets by mouth at bedtime as needed for  mild constipation.    . thiamine 100 MG tablet Take 1 tablet (100 mg total) by mouth daily. 30 tablet 0  . torsemide (DEMADEX) 20 MG tablet Take 2 tablets (40 mg total) by mouth 2 (two) times daily. 4 tablet 0  . traZODone (DESYREL) 50 MG tablet Take 1 tablet (50 mg total) by mouth at bedtime as needed for sleep. 30 tablet 0   No current facility-administered medications for this visit.      PHYSICAL EXAM: Vitals:   07/23/19 0826  BP: (!) 154/68  Pulse: 76  Resp: 20  Temp: (!) 97.3 F (36.3 C)  SpO2: 95%  Weight: 104.3 kg  Height: 5\' 4"  (1.626 m)    GENERAL: The patient is a well-nourished female, in no acute distress. The vital signs are documented above. Her  femoral anterior tibial bypass runs laterally and is easily palpable and she has an easily palpable left dorsalis pedis pulse.  Her groin and thigh incisions are healing quite nicely.  She does have marked peripheral edema bilaterally and is having some slower healing of her medial and lateral calf incisions.  Her fifth toe amputation site appears to be healing nicely  MEDICAL ISSUES: Stable overall.  Instructed her on wrapping and we have wrapped her with Ace from her foot to her knee on the left.  Will elevate when she can.  We will see her again in 2 weeks for removal of sutures in her toe amputation which appears to be healing quite nicely as well   Rosetta Posner, MD FACS [facility] Tel [phone] Pager [phone]

## 2019-07-30 ENCOUNTER — Other Ambulatory Visit (HOSPITAL_COMMUNITY): Payer: Self-pay

## 2019-07-30 NOTE — Progress Notes (Signed)
Paramedicine Encounter    Patient ID: [name], female    DOB: 1961/09/27, 58 y.o.   MRN: [id number]   Patient Care Team: Tsosie Billing, MD (Inactive) as PCP - General (Internal Medicine) [name], MD as PCP - Cardiology (Cardiology) [name], MD as Consulting Physician (Cardiology) [name], MD as Consulting Physician (Pulmonary Disease)  Patient Active Problem List   Diagnosis Date Noted  . Cellulitis in diabetic foot ([place]) 07/08/2019  . Osteomyelitis ([place]) 06/21/2019  . NICM (nonischemic cardiomyopathy) ([place]) 06/20/2019  . Non-healing ulcer ([place]) 06/20/2019  . Acute CHF (congestive heart failure) ([place]) 11/26/2018  . CHF (congestive heart failure) ([place]) 11/26/2018  . Acute respiratory failure ([place]) 10/21/2018  . AKI (acute kidney injury) ([place]) 08/18/2018  . Coagulation disorder ([place]) 08/09/2017  . Depression 07/21/2017  . At risk for adverse drug reaction 06/20/2017  . Peripheral neuropathy 06/20/2017  . Acute osteomyelitis of right foot ([place]) 06/13/2017  . S/P transmetatarsal amputation of foot, right ([place]) 06/05/2017  . 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[place])   . SIRS (systemic inflammatory response syndrome) ([place]) 04/06/2017  . CKD (chronic kidney disease), stage III ([place]) 11/24/2016  . Suspected sleep apnea 11/24/2016  . Ulcer of toe of right foot, with necrosis of bone ([place]) 10/27/2016  . Ulcer of left lower leg ([place]) 05/19/2016  . Severe obesity (BMI >= 40) ([place]) 02/24/2016  . COPD GOLD 0  02/24/2016  . Encounter for therapeutic drug monitoring 02/10/2016  . Symptomatic bradycardia 01/12/2016  . Essential hypertension 12/22/2015  . Chronic combined systolic and diastolic CHF (congestive heart failure) ([place])   . Wheeze   . 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 Tobacco abuse 10/23/2015  . Coronary artery disease   .  DOE (dyspnea on exertion) 04/29/2015  . Diabetes mellitus type 2, uncontrolled ([place]) 02/08/2015  . Influenza A 02/07/2015  . Wrist fracture, left, with routine healing, subsequent encounter 02/05/2015  . Wrist fracture, left, closed, initial encounter 01/29/2015  . PAF (paroxysmal atrial fibrillation) ([place]) 01/16/2015  . Carotid arterial disease ([place]) 01/16/2015  . Claudication ([place]) 01/15/2015  . Demand ischemia ([place]) 10/29/2014  . Insomnia 02/03/2014  . S/P peripheral artery angioplasty - [place]bo[place]awk atherectomy; R SFA 09/11/2013    Class: Acute  . Leg pain, bilateral 08/19/2013  . Hypothyroidism 07/31/2013  . Cellulitis 06/13/2013  . History of cocaine abuse ([place]) 06/13/2013  . Long term current use of anticoagulant therapy 05/20/2013  . Alcohol abuse   . Narcotic abuse ([place])   . Marijuana abuse   . Alcoholic cirrhosis ([place])   . DM (diabetes mellitus), type 2 with peripheral vascular complications ([name])     Current Outpatient Medications:  .  acetaminophen (TYLENOL) 500 MG tablet, Take 2 tablets (1,000 mg total) by mouth every 6 (six) hours as needed for mild pain., Disp: 30 tablet, Rfl: 0 .  albuterol (VENTOLIN HFA) 108 (90 Base) MCG/ACT inhaler, Inhale 2 puffs into the lungs every 6 (six) hours as needed for wheezing or shortness of breath., Disp: 18 g, Rfl: 0 .  amiodarone (PACERONE) 100 MG tablet, Take 1 tablet (100 mg total) by mouth daily., Disp: , Rfl:  .  amLODipine (NORVASC) 10 MG tablet, Take 1 tablet (10 mg total) by mouth daily., Disp: 90 tablet, Rfl: 0 .  apixaban (ELIQUIS) 5 MG TABS tablet, Take 1 tablet (5 mg total) by mouth 2 (two) times  daily., Disp: 60 tablet, Rfl: 0 .  atorvastatin (LIPITOR) 40 MG tablet, TAKE 1 TABLET BY MOUTH ONCE EVERY EVENING, Disp: 30 tablet, Rfl: 0 .  bisoprolol (ZEBETA) 5 MG tablet, Take 1 tablet (5 mg total) by mouth daily., Disp: 30 tablet, Rfl: 5 .  cholecalciferol (VITAMIN D) 1000 units tablet, Take 1 tablet (1,000 Units total) by mouth  daily., Disp:  , Rfl:  .  clopidogrel (PLAVIX) 75 MG tablet, Take 1 tablet (75 mg total) by mouth daily., Disp:  , Rfl:  .  diclofenac sodium (VOLTAREN) 1 % GEL, Apply 2 g topically 3 (three) times daily as needed., Disp: , Rfl:  .  folic acid (FOLVITE) 1 MG tablet, Take 1 tablet (1 mg total) by mouth daily., Disp: 30 tablet, Rfl: 0 .  Insulin Glargine (LANTUS SOLOSTAR) 100 UNIT/ML Solostar Pen, Inject 30 Units into the skin at bedtime., Disp: 15 mL, Rfl: 11 .  Insulin Lispro (HUMALOG KWIKPEN) 200 UNIT/ML SOPN, Inject 8 Units into the skin 3 (three) times daily with meals., Disp: , Rfl:  .  levothyroxine (SYNTHROID, LEVOTHROID) 50 MCG tablet, Take 1 tablet (50 mcg total) by mouth daily., Disp: 30 tablet, Rfl: 0 .  methocarbamol (ROBAXIN) 500 MG tablet, Take 1 tablet (500 mg total) by mouth 3 (three) times daily as needed for muscle spasms., Disp: 45 tablet, Rfl: 0 .  metolazone (ZAROXOLYN) 2.5 MG tablet, Take 2.5 mg by mouth daily., Disp: , Rfl:  .  nortriptyline (PAMELOR) 10 MG capsule, Take 20 mg by mouth at bedtime., Disp: , Rfl:  .  pantoprazole (PROTONIX) 40 MG tablet, Take 1 tablet (40 mg total) by mouth daily., Disp:  , Rfl:  .  thiamine 100 MG tablet, Take 1 tablet (100 mg total) by mouth daily., Disp: 30 tablet, Rfl: 0 .  torsemide (DEMADEX) 20 MG tablet, Take 2 tablets (40 mg total) by mouth 2 (two) times daily., Disp: 4 tablet, Rfl: 0 .  traZODone (DESYREL) 50 MG tablet, Take 1 tablet (50 mg total) by mouth at bedtime as needed for sleep., Disp: 30 tablet, Rfl: 0 .  docusate sodium (COLACE) 100 MG capsule, Take 1 capsule (100 mg total) by mouth daily. (Patient not taking: Reported on 07/30/2019), Disp: 10 capsule, Rfl: 0 .  doxycycline (VIBRAMYCIN) 100 MG capsule, Take 100 mg by mouth 2 (two) times daily., Disp: , Rfl:  .  guaiFENesin-dextromethorphan (ROBITUSSIN DM) 100-10 MG/5ML syrup, Take 15 mLs by mouth every 4 (four) hours as needed for cough. (Patient not taking: Reported on  07/30/2019), Disp: 118 mL, Rfl: 0 .  HYDROcodone-acetaminophen (NORCO/VICODIN) 5-325 MG tablet, Take 1-2 tablets by mouth every 6 (six) hours as needed for severe pain. (Patient not taking: Reported on 07/30/2019), Disp: 20 tablet, Rfl: 0 .  losartan (COZAAR) 100 MG tablet, Take 1 tablet (100 mg total) by mouth daily. HOLD until BP and BMP check (Patient not taking: Reported on 07/30/2019), Disp: 30 tablet, Rfl: 5 .  Multiple Vitamin (MULTIVITAMIN WITH MINERALS) TABS tablet, Take 1 tablet by mouth daily. (Patient not taking: Reported on 07/30/2019), Disp: 30 tablet, Rfl: 0 .  nicotine (NICODERM CQ - DOSED IN MG/24 HOURS) 21 mg/24hr patch, Place 1 patch (21 mg total) onto the skin daily. (Patient not taking: Reported on 07/30/2019), Disp: 28 patch, Rfl: 0 .  senna-docusate (SENOKOT-S) 8.6-50 MG tablet, Take 2 tablets by mouth at bedtime as needed for mild constipation. (Patient not taking: Reported on 07/30/2019), Disp: , Rfl:  Allergies  Allergen Reactions  .  Gabapentin Nausea And Vomiting and Other (See Comments)    POSSIBLE SHAKING? TAKING MED CURRENTLY  . Lyrica [Pregabalin] Other (See Comments)    Shaking Pt still taking lyrica--"probably will stop taking"       Social History   Socioeconomic History  . Marital status: Divorced    Spouse name: Not on file  . Number of children: 1  . Years of education: 16  . Highest education level: 12th grade  Occupational History  . Occupation: disabled  Social Needs  . Financial resource strain: Somewhat hard  . Food insecurity    Worry: Never true    Inability: Never true  . Transportation needs    Medical: No    Non-medical: No  Tobacco Use  . Smoking status: Current Some Day Smoker    Packs/day: 1.00    Years: 44.00    Pack years: 44.00    Types: E-cigarettes, Cigarettes  . Smokeless tobacco: Former Systems developer    Types: Snuff  Substance and Sexual Activity  . Alcohol use: Yes    Comment: 11/26/2018 "couple drinks/month maybe"  . Drug use:  Yes    Types: "Crack" cocaine, Marijuana, Oxycodone    Comment: last used 3 days ago   . Sexual activity: Not Currently  Lifestyle  . Physical activity    Days per week: Not on file    Minutes per session: Not on file  . Stress: Not on file  Relationships  . Social Herbalist on phone: Not on file    Gets together: Not on file    Attends religious service: Not on file    Active member of club or organization: Not on file    Attends meetings of clubs or organizations: Not on file    Relationship status: Not on file  . Intimate partner violence    Fear of current or ex partner: Not on file    Emotionally abused: Not on file    Physically abused: Not on file    Forced sexual activity: Not on file  Other Topics Concern  . Not on file  Social History Narrative   Lives in [place], in [place] with sister.  They are looking to move but don't have a place to go yet.      Physical Exam      Future Appointments  Date Time Provider [place]  08/13/2019  8:20 AM Early, Arvilla Meres, MD [facility]    BP (!) 154/70   Pulse 72   Temp (!) 97 F (36.1 C)   Resp 18   Wt 219 lb (99.3 kg)   LMP 11/27/2012   SpO2 98%   BMI 37.59 kg/m   Weight yesterday-219   Pt home from rehab on Friday, she was released home with meds from rehab. She states she is feeling better. Her rt foot still hurts where her surgery was but her left leg is feeling better.  Reviewed her med list and the metolazone and the nortriptyline was not on the epic list but on list per rehab.  Her cbg's are ranging between 180s-300s.  She is on the [employer] and is down to 219lbs.  She states her breathing is doing fine. She is wheezing to the rt upper lobe, but denies sob. No dizziness, no c/p. No edema noted.  I will get the list to [facility] so he can update her list and make the bubble packs.   Marylouise Stacks, [facility] Paramedic  07/30/19   

## 2019-07-31 ENCOUNTER — Other Ambulatory Visit (HOSPITAL_COMMUNITY): Payer: Self-pay

## 2019-07-31 NOTE — Progress Notes (Signed)
I took a list of pts updated meds to [facility] so he can fill her bubble packs.  He called me back to report he needs refills on: Atorvastatin Nortriptyline  Pantoprazole Thiamine  trazadone   I will contact her PCP to ask these get sent to pharmacy.   Marylouise Stacks, EMT-Paramedic  07/31/19

## 2019-08-01 ENCOUNTER — Telehealth (HOSPITAL_COMMUNITY): Payer: Self-pay

## 2019-08-01 NOTE — Telephone Encounter (Signed)
I called pts PCP this morning requesting that a few meds be sent in to [facility] for refills so her bubble packs can be completed.  She needed atorvastatin, nortriptyline, pantoprazole, thiamine and trazadone sent in.   -pharmacy called me back stating he got a few of them but still needed nortriptyline, thiamine and colace if she is to take that daily.   Ms polega called me after her PCP appoint and advised the colace is needed daily and the metolazone is being held [facility]-  I called pharmacy back to let him know of the changes.  I called PCP again and request those 3 get sent in please.  Receptionist assured me it would be taken care of today.   Marylouise Stacks, EMT-Paramedic  08/01/19

## 2019-08-02 ENCOUNTER — Other Ambulatory Visit (HOSPITAL_COMMUNITY): Payer: Self-pay | Admitting: Cardiology

## 2019-08-06 ENCOUNTER — Telehealth (HOSPITAL_COMMUNITY): Payer: Self-pay

## 2019-08-07 ENCOUNTER — Telehealth: Payer: Self-pay | Admitting: Vascular Surgery

## 2019-08-07 NOTE — Telephone Encounter (Signed)
[name] with [facility] home [facility] [phone] calling requesting an order to hold home health for 14 days due to patient exposure to [place]. Patient is refusing home health services because she was exposed to [place] and told [name] that she would have her roommate change her bandages or she would do it herself. [name] states she did tell patient that they have the appropriate PPE and protocols in place to be able to do home health services and the patient is still declining. Please advise. AS, CMA

## 2019-08-07 NOTE — Telephone Encounter (Signed)
I was scheduled to see pt tomor, she called me to report she potentially has been exposed to COVID-19 and has to self quarantine for 14 days so she was cancelling our appointment.  She has her bubble packs.  She denies any issues right now-she cancelled the home health as well but she will have to wrap her own legs for now. She said she could do ok with it. She will call should she have any issues.   Marylouise Stacks, EMT-Paramedic  08/07/19

## 2019-08-12 IMAGING — DX DG CHEST 1V PORT
1 series · 1 of 1 positions shown · non-contrast
Comparison: 10/21/2018 and 08/18/2017

CLINICAL DATA: Congestive heart failure.

EXAM:
PORTABLE CHEST 1 VIEW

[chest ap]
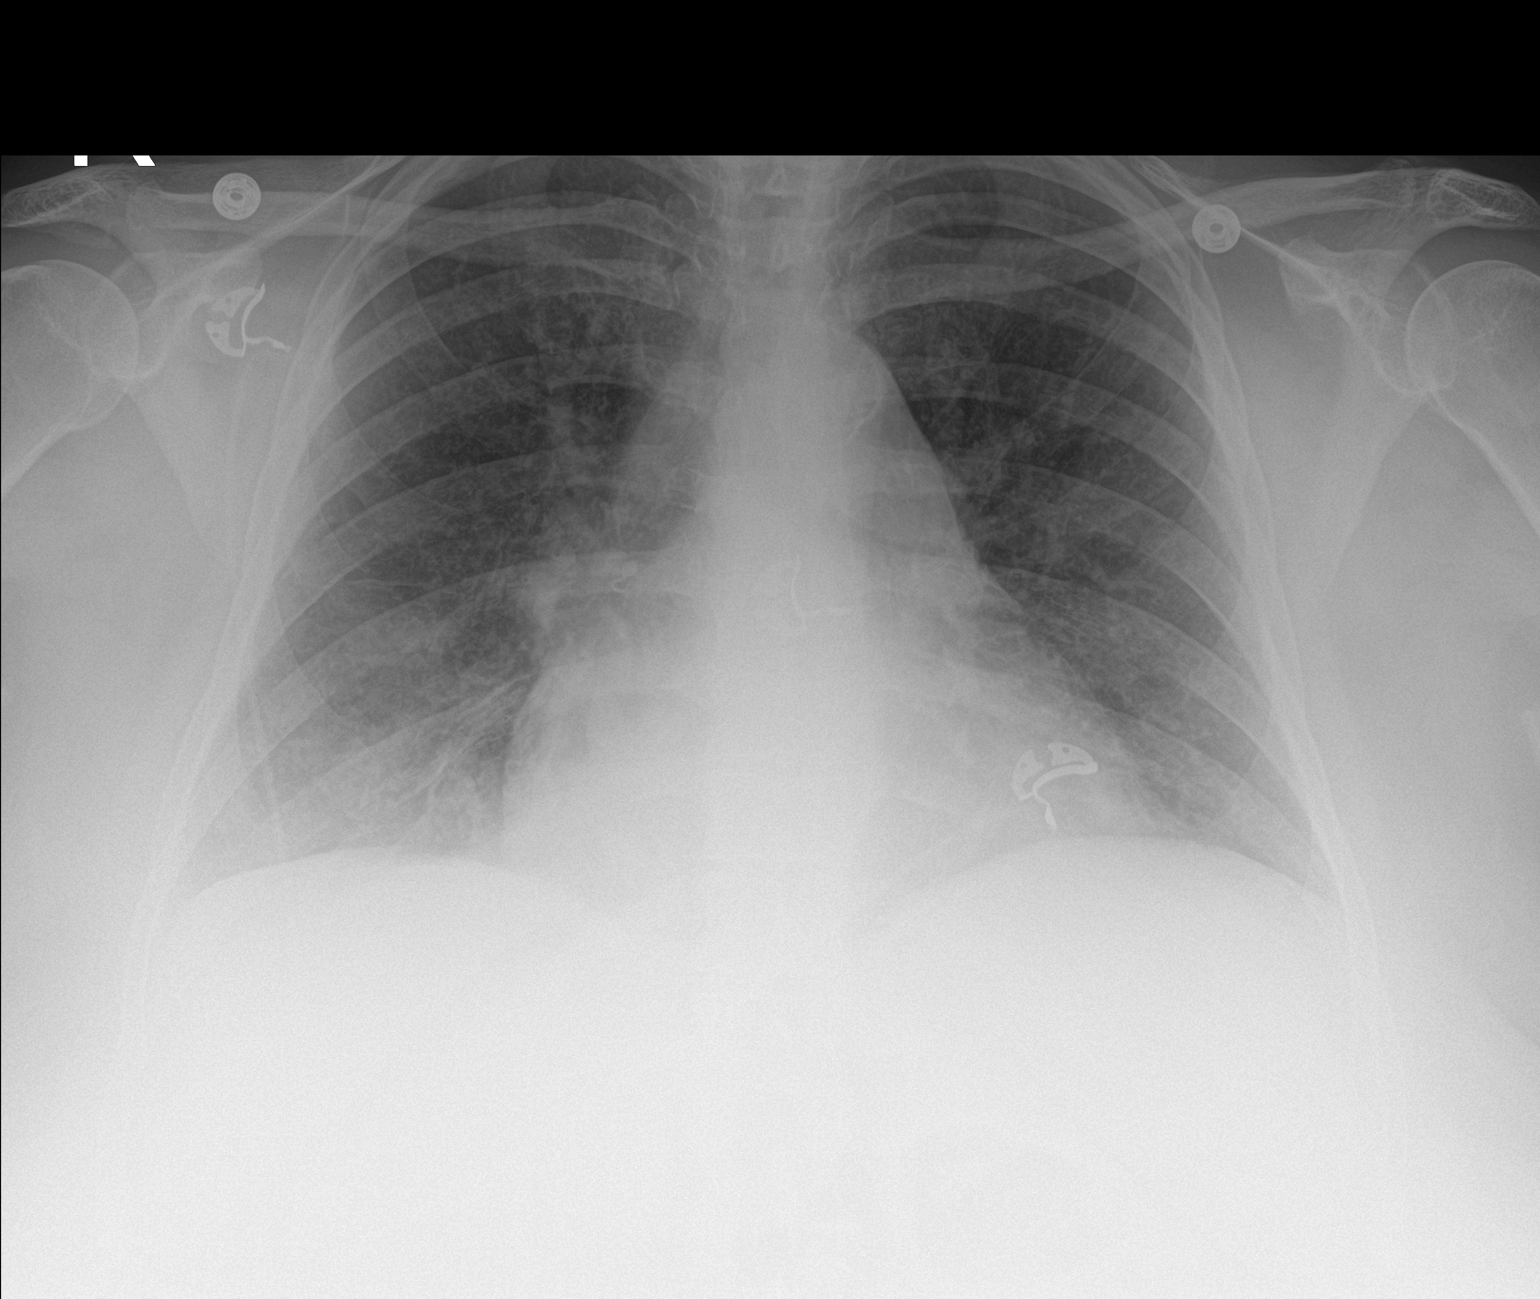

[1 of 1 positions shown; findings below may reference images not displayed]

FINDINGS: Heart size and pulmonary vascularity are within normal limits. There
is slight haziness at the right base posterior medially of the lungs
are otherwise clear. No discrete effusions. No appreciable bone
abnormality.
IMPRESSION: Probable slight atelectasis at the right lung base. No visible
pulmonary edema.

## 2019-08-13 ENCOUNTER — Other Ambulatory Visit: Payer: Self-pay

## 2019-08-13 ENCOUNTER — Encounter: Payer: Self-pay | Admitting: Vascular Surgery

## 2019-08-13 ENCOUNTER — Ambulatory Visit (INDEPENDENT_AMBULATORY_CARE_PROVIDER_SITE_OTHER): Payer: Self-pay | Admitting: Vascular Surgery

## 2019-08-13 VITALS — BP 132/64 | HR 77 | Temp 97.6°F | Resp 20 | Ht 64.0 in | Wt 221.9 lb

## 2019-08-13 DIAGNOSIS — I739 Peripheral vascular disease, unspecified: Secondary | ICD-10-CM

## 2019-08-13 NOTE — Progress Notes (Signed)
Patient name: [name] MRN: [id number] DOB: 12/06/1960 Sex: female  REASON FOR VISIT: Follow-up left femoral to anterior tibial bypass and left fifth toe amputation  HPI: [name] is a 58 y.o. female here for follow-up.  Continues to do very well.  She has been diligent in wearing her Ace wrap for compression.  Has minimal discomfort  Current Outpatient Medications  Medication Sig Dispense Refill  . acetaminophen (TYLENOL) 500 MG tablet Take 2 tablets (1,000 mg total) by mouth every 6 (six) hours as needed for mild pain. 30 tablet 0  . albuterol (VENTOLIN HFA) 108 (90 Base) MCG/ACT inhaler Inhale 2 puffs into the lungs every 6 (six) hours as needed for wheezing or shortness of breath. 18 g 0  . amiodarone (PACERONE) 100 MG tablet Take 1 tablet (100 mg total) by mouth daily. (Patient taking differently: Take 50 mg by mouth daily. )    . amLODipine (NORVASC) 10 MG tablet Take 1 tablet (10 mg total) by mouth daily. 90 tablet 0  . apixaban (ELIQUIS) 5 MG TABS tablet Take 1 tablet (5 mg total) by mouth 2 (two) times daily. 60 tablet 0  . atorvastatin (LIPITOR) 40 MG tablet TAKE 1 TABLET BY MOUTH ONCE EVERY EVENING 30 tablet 0  . bisoprolol (ZEBETA) 5 MG tablet Take 1 tablet (5 mg total) by mouth daily. 30 tablet 5  . cholecalciferol (VITAMIN D) 1000 units tablet Take 1 tablet (1,000 Units total) by mouth daily.    . clopidogrel (PLAVIX) 75 MG tablet Take 1 tablet (75 mg total) by mouth daily.    . diclofenac sodium (VOLTAREN) 1 % GEL Apply 2 g topically 3 (three) times daily as needed.    . docusate sodium (COLACE) 100 MG capsule Take 1 capsule (100 mg total) by mouth daily. 10 capsule 0  . doxycycline (VIBRAMYCIN) 100 MG capsule Take 100 mg by mouth 2 (two) times daily.    . folic acid (FOLVITE) 1 MG tablet Take 1 tablet (1 mg total) by mouth daily. 30 tablet 0  . guaiFENesin-dextromethorphan (ROBITUSSIN DM) 100-10 MG/5ML syrup Take 15 mLs by mouth  every 4 (four) hours as needed for cough. 118 mL 0  . HYDROcodone-acetaminophen (NORCO/VICODIN) 5-325 MG tablet Take 1-2 tablets by mouth every 6 (six) hours as needed for severe pain. 20 tablet 0  . Insulin Glargine (LANTUS SOLOSTAR) 100 UNIT/ML Solostar Pen Inject 30 Units into the skin at bedtime. 15 mL 11  . Insulin Lispro (HUMALOG KWIKPEN) 200 UNIT/ML SOPN Inject 8 Units into the skin 3 (three) times daily with meals.    [name] levothyroxine (SYNTHROID, LEVOTHROID) 50 MCG tablet Take 1 tablet (50 mcg total) by mouth daily. 30 tablet 0  . losartan (COZAAR) 100 MG tablet Take 1 tablet (100 mg total) by mouth daily. HOLD until BP and BMP check 30 tablet 5  . methocarbamol (ROBAXIN) 500 MG tablet Take 1 tablet (500 mg total) by mouth 3 (three) times daily as needed for muscle spasms. 45 tablet 0  . metolazone (ZAROXOLYN) 2.5 MG tablet Take 2.5 mg by mouth daily.    . Multiple Vitamin (MULTIVITAMIN WITH MINERALS) TABS tablet Take 1 tablet by mouth daily. 30 tablet 0  . nicotine (NICODERM CQ - DOSED IN MG/24 HOURS) 21 mg/24hr patch Place 1 patch (21 mg total) onto the skin daily. 28 patch 0  . nortriptyline (PAMELOR) 10 MG capsule Take 20 mg by mouth at bedtime.    [name] oxyCODONE-acetaminophen (PERCOCET/ROXICET) 5-325 MG tablet Take  1 tablet by mouth every 8 (eight) hours as needed. for pain    . pantoprazole (PROTONIX) 40 MG tablet Take 1 tablet (40 mg total) by mouth daily.    [name] senna-docusate (SENOKOT-S) 8.6-50 MG tablet Take 2 tablets by mouth at bedtime as needed for mild constipation.    . thiamine 100 MG tablet Take 1 tablet (100 mg total) by mouth daily. 30 tablet 0  . torsemide (DEMADEX) 20 MG tablet Take 2 tablets (40 mg total) by mouth 2 (two) times daily. 4 tablet 0  . traZODone (DESYREL) 50 MG tablet Take 1 tablet (50 mg total) by mouth at bedtime as needed for sleep. 30 tablet 0   No current facility-administered medications for this visit.      PHYSICAL EXAM: Vitals:   08/13/19 0827   BP: 132/64  Pulse: 77  Resp: 20  Temp: 97.6 F (36.4 C)  SpO2: 95%  Weight: 221 lb 14.4 oz (100.7 kg)  Height: 5\' 4"  (1.626 m)    GENERAL: The patient is a well-nourished female, in no acute distress. The vital signs are documented above. Surgical incisions are all healed.  She has an easily palpable lateral femoral to anterior tibial graft pulse and also dorsalis pedis pulse.  Her toe amputation is nearly completely healed.  There was an open area at the very distal tip that has good granulating base.  MEDICAL ISSUES: Stable overall.  I removed her sutures from her toe amputation site today.  She will continue her usual activities and I will see her again for wound check in 1 month   Rosetta Posner, MD [facility] Tel [phone] Pager [phone]

## 2019-08-16 ENCOUNTER — Telehealth: Payer: Self-pay

## 2019-08-16 NOTE — Telephone Encounter (Signed)
Dyann Ruddle with [facility] called to say that the patient had a cat scratch to her upper thigh and wanted to let us know. Advised there was no redness and no appearance of infection.   Advised to continue to watch and PCP may need to be notified if concerns arise.   York Cerise, CMA

## 2019-08-29 ENCOUNTER — Telehealth: Payer: Self-pay | Admitting: Vascular Surgery

## 2019-08-29 NOTE — Telephone Encounter (Addendum)
Deatra Canter from [facility] called.  Pt has purulent drainage and a tiny opening on foot.  It does not look any worse than her last visit with Dr Donnetta Hutching.  She will continue dressing with Iodaform.  Pt has follow up appt on 09/17/2019.  Dyann Ruddle instructed pt on how to dress the wound  in-between [place] visits as well as the importance of keeping the dressing on.  Pt will follow up sooner if the wound starts to look worse.  Thurston Hole., LPN

## 2019-08-30 ENCOUNTER — Other Ambulatory Visit (HOSPITAL_COMMUNITY): Payer: Self-pay | Admitting: Cardiology

## 2019-09-02 ENCOUNTER — Other Ambulatory Visit (HOSPITAL_COMMUNITY): Payer: Self-pay | Admitting: Cardiology

## 2019-09-02 DIAGNOSIS — I10 Essential (primary) hypertension: Secondary | ICD-10-CM

## 2019-09-02 DIAGNOSIS — I480 Paroxysmal atrial fibrillation: Secondary | ICD-10-CM

## 2019-09-03 ENCOUNTER — Telehealth (HOSPITAL_COMMUNITY): Payer: Self-pay

## 2019-09-03 ENCOUNTER — Other Ambulatory Visit (HOSPITAL_COMMUNITY): Payer: Self-pay | Admitting: *Deleted

## 2019-09-03 MED ORDER — TORSEMIDE 20 MG PO TABS: 40.0000 mg | ORAL_TABLET | Freq: Two times a day (BID) | ORAL | 3 refills | Status: DC

## 2019-09-04 NOTE — Telephone Encounter (Signed)
Refill request

## 2019-09-04 NOTE — Telephone Encounter (Signed)
This is a [place] pt 

## 2019-09-09 ENCOUNTER — Telehealth (HOSPITAL_COMMUNITY): Payer: Self-pay

## 2019-09-09 NOTE — Telephone Encounter (Signed)
Attempted to call pt again regarding home visit, no answer again. LVM for her to return my call.   Marylouise Stacks, EMT-Paramedic  09/09/19

## 2019-09-09 NOTE — Telephone Encounter (Signed)
Attempted to reach pt regarding home visit, no answer.  [facility] also called me regarding refills pt needs and he tried calling her as well and no answer.   Marylouise Stacks, EMT-Paramedic  09/09/19

## 2019-09-17 ENCOUNTER — Telehealth (HOSPITAL_COMMUNITY): Payer: Self-pay

## 2019-09-17 ENCOUNTER — Ambulatory Visit: Payer: Medicare Other | Admitting: Vascular Surgery

## 2019-09-17 NOTE — Telephone Encounter (Signed)
Finally reached pt today and spoke to her about any further needs from paramedicine. She reports she is doing better, still has nurse coming out for wound care and is doing fine about her meds and needs no further assistance from me.  She is aware to call back should that change.   Marylouise Stacks, EMT-Paramedic  09/17/19

## 2019-09-22 NOTE — Progress Notes (Signed)
Virtual Visit via Telephone Note   This visit type was conducted due to national recommendations for restrictions regarding the COVID-19 Pandemic (e.g. social distancing) in an effort to limit this patient's exposure and mitigate transmission in our community.  Due to her co-morbid illnesses, this patient is at least at moderate risk for complications without adequate follow up.  This format is felt to be most appropriate for this patient at this time.  The patient did not have access to video technology/had technical difficulties with video requiring transitioning to audio format only (telephone).  All issues noted in this document were discussed and addressed.  No physical exam could be performed with this format.  Please refer to the patient's chart for her  consent to telehealth for [facility].   Date:  09/23/2019   ID:  [name], DOB 02/01/1961, MRN [id number]  Patient Location: Home Provider Location: Office  PCP:  Tsosie Billing, MD (Inactive)  Cardiologist:  Quay Burow, MD  Electrophysiologist:  None   Evaluation Performed:  Follow-Up Visit  Chief Complaint:  PAD  History of Present Illness:    [name] is a 58 y.o. female with a history of nonischemic cardiomyopathy, chronic systolic heart failure with EF 45%, PAF on eliquis and amiodarone, IDDM, ongoing smoking, COPD, obesity, extensive vascular disease with history of right SFA intervention 2014, left CEA 2016, redo right SFA CTO intervention 2018 in the setting of critical limb ischemia, right foot amputation.  Dr. Gwenlyn Found consulted 06/24/19 while hospitalized and underwent angiograph on 06/26/19 with Dr. Fletcher Anon (pt took eliquis) which showed long occlusion of the left SFA with moderate popliteal disease and one-vessel runoff below the knee. Unfortunately, could not cross the left SFA occlusion in spite of anterograde and retrograde access. Vascular surgery was consulted.  She underwent left femoral to  anterior bypass and left fifth toe amputation on 06/28/19 with vascular surgery.   She presents today for follow up. She has a follow up with vascular tomorrow. She complains of neuropathy. Sounds like she is having phantom limb syndrome on the right vs sciatic nerve pain. She uses a walker. She walks up 9 steps to get in the house and steps to get to the bathroom without angina.  She has new eye drops for dry eye. Pressures have been well-controlled. She denies dyspnea, orthopnea, and lower extremity swelling.    The patient does not have symptoms concerning for COVID-19 infection (fever, chills, cough, or new shortness of breath).    Past Medical History:  Diagnosis Date   Alcohol abuse    Alcoholic cirrhosis ([place])    Anemia    Anxiety    Arthritis    "knees" (11/26/2018)   B12 deficiency    CAD (coronary artery disease)    a. cath 11/10/2014 LM nl, LAD min irregs, D1 30 ost, D2 50d, LCX 109m, OM1 80 p/m (1.5 mm vessel), OM2 86m, RCA nondom 41m-->med rx.. Demand ischemia in the setting of rapid a-fib.   Cardiomyopathy ([place])    Carotid artery disease ([place])    a. 01/2015 Carotid Angio: RICA 123XX123, LICA [id number]. s/p L carotid endarterectomy 02/2015.   Cellulitis    lower extremities   Chronic combined systolic and diastolic CHF (congestive heart failure) ([place])    a. Last echo 12/205: EF 40-45%, inferoapical/posterior HK, not technically sufficient to allow eval of LV diastolic function, normal LA in size..   Cocaine abuse ([place])    COPD (chronic obstructive pulmonary disease) ([place])  Critical lower limb ischemia    Depression    Diabetic peripheral neuropathy ([name])    Elevated troponin    a. Chronic elevation.   GERD (gastroesophageal reflux disease)    Hyperlipemia    Hypertension    Hypokalemia    Hypomagnesemia    Hypothyroidism    Marijuana abuse    Narcotic abuse ([place])    Noncompliance    NSVT (nonsustained ventricular tachycardia) ([name])    Obesity     PAF (paroxysmal atrial fibrillation) ([name])    Paroxysmal atrial tachycardia ([name])    Peripheral arterial disease ([place])    a. 01/2015 Angio/PTA: LSFA 100 w/ recon @ adductor canal and 1 vessel runoff via AT, RSFA 99 (atherectomy/pta) - 1 vessel runoff via diff dzs peroneal.   Pneumonia    "once or twice" (11/26/2018)   Poorly controlled type 2 diabetes mellitus ([place])    Renal insufficiency    a. 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   Sleep apnea    "couldn't handle wearing the mask" (11/26/2018)   Symptomatic bradycardia    avoid AV blocking agent per EP   Tobacco abuse    Past Surgical History:  Procedure Laterality Date   ABDOMINAL AORTOGRAM N/A 06/26/2019   Procedure: ABDOMINAL AORTOGRAM;  Surgeon: Wellington Hampshire, MD;  Location: [place] [facility];  Service: Cardiovascular;  Laterality: N/A;   AMPUTATION Right 06/14/2017   Procedure: Right foot transmetatarsal amputation;  Surgeon: Newt Minion, MD;  Location: [place];  Service: Orthopedics;  Laterality: Right;   AMPUTATION Left 06/28/2019   Procedure: AMPUTATION LEFT FIFTH TOE;  Surgeon: Rosetta Posner, MD;  Location: [place];  Service: Vascular;  Laterality: Left;   AMPUTATION TOE Right 04/28/2017   Procedure: AMPUTATION OF RIGHT SECOND RAY;  Surgeon: Newt Minion, MD;  Location: [place];  Service: Orthopedics;  Laterality: Right;   CARDIAC CATHETERIZATION     CARDIAC CATHETERIZATION N/A 01/12/2016   Procedure: Temporary Wire;  Surgeon: Minus Breeding, MD;  Location: [place] [facility];  Service: Cardiovascular;  Laterality: N/A;   CARDIOVERSION  ~ 02/2013   "twice"    CAROTID ANGIOGRAM N/A 01/15/2015   Procedure: CAROTID ANGIOGRAM;  Surgeon: [name], MD;  Location: [facility] CATH LAB;  Service: Cardiovascular;  Laterality: N/A;   DILATION AND CURETTAGE OF UTERUS  1988   ENDARTERECTOMY Left 02/19/2015   Procedure: LEFT CAROTID ENDARTERECTOMY ;  Surgeon: Serafina Mitchell, MD;  Location: [place];  Service: Vascular;  Laterality: Left;    FEMORAL-TIBIAL BYPASS GRAFT Left 06/28/2019   Procedure: BYPASS GRAFT LEFT LEG FEMORAL TO ANTERIOR TIBIAL ARTERY using LEFT GREATER SAPHENOUS VEIN;  Surgeon: Rosetta Posner, MD;  Location: [place];  Service: Vascular;  Laterality: Left;   LEFT HEART CATHETERIZATION WITH CORONARY ANGIOGRAM N/A 10/31/2014   Procedure: LEFT HEART CATHETERIZATION WITH CORONARY ANGIOGRAM;  Surgeon: Burnell Blanks, MD;  Location: [facility] CATH LAB;  Service: Cardiovascular;  Laterality: N/A;   LOWER EXTREMITY ANGIOGRAM N/A 09/10/2013   Procedure: LOWER EXTREMITY ANGIOGRAM;  Surgeon: [name], MD;  Location: [facility] CATH LAB;  Service: Cardiovascular;  Laterality: N/A;   LOWER EXTREMITY ANGIOGRAM N/A 01/15/2015   Procedure: LOWER EXTREMITY ANGIOGRAM;  Surgeon: [name], MD;  Location: [facility] CATH LAB;  Service: Cardiovascular;  Laterality: N/A;   LOWER EXTREMITY ANGIOGRAPHY N/A 04/13/2017   Procedure: Lower Extremity Angiography;  Surgeon: [name], MD;  Location: [place] [facility];  Service: Cardiovascular;  Laterality: N/A;   LOWER EXTREMITY ANGIOGRAPHY  Left 06/26/2019   Procedure: LOWER EXTREMITY ANGIOGRAPHY;  Surgeon: Wellington Hampshire, MD;  Location: [name] [facility];  Service: Cardiovascular;  Laterality: Left;   PERIPHERAL VASCULAR BALLOON ANGIOPLASTY Left 06/26/2019   Procedure: PERIPHERAL VASCULAR BALLOON ANGIOPLASTY;  Surgeon: Wellington Hampshire, MD;  Location: [place] CV [facility];  Service: Cardiovascular;  Laterality: Left;  unable to cross lt sfa occlusion   PERIPHERAL VASCULAR INTERVENTION  04/13/2017   Procedure: Peripheral Vascular Intervention;  Surgeon: [name], MD;  Location: [place] CV [facility];  Service: Cardiovascular;;   VEIN HARVEST Left 06/28/2019   Procedure: LEFT LEG GREATER SAPHENOUS VEIN HARVEST;  Surgeon: Rosetta Posner, MD;  Location: [facility];  Service: Vascular;  Laterality: Left;     Current Meds  Medication Sig   acetaminophen (TYLENOL) 500 MG tablet Take 2  tablets (1,000 mg total) by mouth every 6 (six) hours as needed for mild pain.   albuterol (VENTOLIN HFA) 108 (90 Base) MCG/ACT inhaler Inhale 2 puffs into the lungs every 6 (six) hours as needed for wheezing or shortness of breath.   amiodarone (PACERONE) 100 MG tablet Take 1 tablet (100 mg total) by mouth daily. (Patient taking differently: Take 50 mg by mouth daily. )   amLODipine (NORVASC) 10 MG tablet Take 1 tablet (10 mg total) by mouth daily.   apixaban (ELIQUIS) 5 MG TABS tablet Take 1 tablet (5 mg total) by mouth 2 (two) times daily.   atorvastatin (LIPITOR) 40 MG tablet TAKE 1 TABLET BY MOUTH ONCE EVERY EVENING   bisoprolol (ZEBETA) 5 MG tablet Take 1 tablet (5 mg total) by mouth daily.   cholecalciferol (VITAMIN D) 1000 units tablet Take 1 tablet (1,000 Units total) by mouth daily.   clopidogrel (PLAVIX) 75 MG tablet Take 1 tablet (75 mg total) by mouth daily.   docusate sodium (COLACE) 100 MG capsule Take 1 capsule (100 mg total) by mouth daily.   folic acid (FOLVITE) 1 MG tablet Take 1 tablet (1 mg total) by mouth daily.   Insulin Glargine (LANTUS SOLOSTAR) 100 UNIT/ML Solostar Pen Inject 30 Units into the skin at bedtime.   Insulin Lispro (HUMALOG KWIKPEN) 200 UNIT/ML SOPN Inject 8 Units into the skin 3 (three) times daily with meals.   levothyroxine (SYNTHROID, LEVOTHROID) 50 MCG tablet Take 1 tablet (50 mcg total) by mouth daily.   Multiple Vitamin (MULTIVITAMIN WITH MINERALS) TABS tablet Take 1 tablet by mouth daily.   nortriptyline (PAMELOR) 10 MG capsule Take 20 mg by mouth at bedtime.   pantoprazole (PROTONIX) 40 MG tablet Take 1 tablet (40 mg total) by mouth daily.   senna-docusate (SENOKOT-S) 8.6-50 MG tablet Take 2 tablets by mouth at bedtime as needed for mild constipation.   thiamine 100 MG tablet Take 1 tablet (100 mg total) by mouth daily.   torsemide (DEMADEX) 20 MG tablet Take 2 tablets (40 mg total) by mouth 2 (two) times daily.   [DISCONTINUED]  diclofenac sodium (VOLTAREN) 1 % GEL Apply 2 g topically 3 (three) times daily as needed.   [DISCONTINUED] doxycycline (VIBRAMYCIN) 100 MG capsule Take 100 mg by mouth 2 (two) times daily.   [DISCONTINUED] guaiFENesin-dextromethorphan (ROBITUSSIN DM) 100-10 MG/5ML syrup Take 15 mLs by mouth every 4 (four) hours as needed for cough.   [DISCONTINUED] HYDROcodone-acetaminophen (NORCO/VICODIN) 5-325 MG tablet Take 1-2 tablets by mouth every 6 (six) hours as needed for severe pain.   [DISCONTINUED] losartan (COZAAR) 100 MG tablet Take 1 tablet (100 mg total) by mouth daily. HOLD  until BP and BMP check   [DISCONTINUED] methocarbamol (ROBAXIN) 500 MG tablet Take 1 tablet (500 mg total) by mouth 3 (three) times daily as needed for muscle spasms.   [DISCONTINUED] metolazone (ZAROXOLYN) 2.5 MG tablet Take 2.5 mg by mouth daily.   [DISCONTINUED] nicotine (NICODERM CQ - DOSED IN MG/24 HOURS) 21 mg/24hr patch Place 1 patch (21 mg total) onto the skin daily.   [DISCONTINUED] oxyCODONE-acetaminophen (PERCOCET/ROXICET) 5-325 MG tablet Take 1 tablet by mouth every 8 (eight) hours as needed. for pain     Allergies:   Gabapentin and Lyrica [pregabalin]   Social History   Tobacco Use   Smoking status: Current Some Day Smoker    Packs/day: 1.00    Years: 44.00    Pack years: 44.00    Types: E-cigarettes, Cigarettes   Smokeless tobacco: Former Systems developer    Types: Snuff  Substance Use Topics   Alcohol use: Yes    Comment: 11/26/2018 "couple drinks/month maybe"   Drug use: Yes    Types: "Crack" cocaine, Marijuana, Oxycodone    Comment: last used 3 days ago      Family Hx: The patient's family history includes Breast cancer in her mother; Cancer in her mother; Clotting disorder in her mother; Diabetes in her mother; Emphysema in her sister; Heart attack in her mother; Heart disease in her father and mother; Hypertension in her father and mother.  ROS:   Please see the history of present illness.      All other systems reviewed and are negative.   Prior CV studies:   The following studies were reviewed today:  Echo 10/22/18: Study Conclusions  - Left ventricle: The cavity size was mildly dilated. There was   mild concentric hypertrophy. Systolic function was mildly   reduced. The estimated ejection fraction was in the range of 45%   to 50%. Wall motion was normal; there were no regional wall   motion abnormalities. Features are consistent with a pseudonormal   left ventricular filling pattern, with concomitant abnormal   relaxation and increased filling pressure (grade 2 diastolic   dysfunction). Doppler parameters are consistent with high   ventricular filling pressure. - Aortic valve: Transvalvular velocity was within the normal range.   There was no stenosis. There was no regurgitation. - Mitral valve: Transvalvular velocity was within the normal range.   There was no evidence for stenosis. There was mild regurgitation. - Left atrium: The atrium was severely dilated. - Right ventricle: The cavity size was normal. Wall thickness was   normal. Systolic function was normal. - Tricuspid valve: There was mild regurgitation. - Pulmonary arteries: Systolic pressure was moderately to severely   increased. PA peak pressure: 57 mm Hg (S).  Labs/Other Tests and Data Reviewed:    EKG:  No ECG reviewed.  Recent Labs: 10/21/2018: TSH 1.157 11/27/2018: ALT 45; B Natriuretic Peptide 511.3 07/06/2019: BUN 49; Creatinine, Ser 1.21; Hemoglobin 7.5; Platelets 468; Potassium 3.7; Sodium 137   Recent Lipid Panel Lab Results  Component Value Date/Time   CHOL 146 03/27/2018 11:38 AM   TRIG 222 (H) 03/27/2018 11:38 AM   HDL 39 (L) 03/27/2018 11:38 AM   CHOLHDL 3.7 03/27/2018 11:38 AM   LDLCALC 63 03/27/2018 11:38 AM    Wt Readings from Last 3 Encounters:  09/23/19 229 lb (103.9 kg)  08/13/19 221 lb 14.4 oz (100.7 kg)  07/30/19 219 lb (99.3 kg)     Objective:    Vital Signs:  BP  138/90  Ht 5\' 4"  (1.626 m)    Wt 229 lb (103.9 kg)    LMP 11/27/2012    BMI 39.31 kg/m    VITAL SIGNS:  reviewed GEN:  no acute distress RESPIRATORY:  respirations unlabored NEURO:  alert and oriented x 3, no obvious focal deficit PSYCH:  normal affect  ASSESSMENT & PLAN:    PAD S/P left fem-tib bypass and left fifth toe amputation - continue plavix   NICM Chronic systolic heart failure - plavix, bisoprolol, losartan - 40 mg torsemide BID  - she no longer takes metolazone, as listed - she received pills in bubble packs from her pharmacy    Hypertension - medications as above   Hyperlipidemia - needs lipid profile   PAF - amiodarone and eliquis - no palpitations - no problems with bleeding   COVID-19 Education: The signs and symptoms of COVID-19 were discussed with the patient and how to seek care for testing (follow up with PCP or arrange E-visit).  The importance of social distancing was discussed today.  Time:   Today, I have spent 16 minutes with the patient with telehealth technology discussing the above problems.     Medication Adjustments/Labs and Tests Ordered: Current medicines are reviewed at length with the patient today.  Concerns regarding medicines are outlined above.   Tests Ordered: No orders of the defined types were placed in this encounter.   Medication Changes: No orders of the defined types were placed in this encounter.   Follow Up:  Either In Person or Virtual in 6 month(s)  Signed, Ledora Bottcher, [place]  09/23/2019 3:07 PM    [place]

## 2019-09-23 ENCOUNTER — Telehealth (INDEPENDENT_AMBULATORY_CARE_PROVIDER_SITE_OTHER): Payer: Medicare Other | Admitting: Physician Assistant

## 2019-09-23 VITALS — BP 138/90 | Ht 64.0 in | Wt 229.0 lb

## 2019-09-23 DIAGNOSIS — I428 Other cardiomyopathies: Secondary | ICD-10-CM

## 2019-09-23 DIAGNOSIS — I480 Paroxysmal atrial fibrillation: Secondary | ICD-10-CM

## 2019-09-23 DIAGNOSIS — I10 Essential (primary) hypertension: Secondary | ICD-10-CM

## 2019-09-23 DIAGNOSIS — I5042 Chronic combined systolic (congestive) and diastolic (congestive) heart failure: Secondary | ICD-10-CM

## 2019-09-23 DIAGNOSIS — N1831 Chronic kidney disease, stage 3a: Secondary | ICD-10-CM

## 2019-09-23 DIAGNOSIS — I739 Peripheral vascular disease, unspecified: Secondary | ICD-10-CM

## 2019-09-23 DIAGNOSIS — I2510 Atherosclerotic heart disease of native coronary artery without angina pectoris: Secondary | ICD-10-CM

## 2019-09-23 NOTE — Patient Instructions (Signed)
Medication Instructions:  Your physician recommends that you continue on your current medications as directed. Please refer to the Current Medication list given to you today.  *If you need a refill on your cardiac medications before your next appointment, please call your pharmacy*   Follow-Up: At [facility], you and your health needs are our priority.  As part of our continuing mission to provide you with exceptional heart care, we have created designated Provider Care Teams.  These Care Teams include your primary Cardiologist (physician) and Advanced Practice Providers (APPs -  Physician Assistants and Nurse Practitioners) who all work together to provide you with the care you need, when you need it.  Your next appointment:   6 months  The format for your next appointment:   Either In Person or Virtual  Provider:   You may see Quay Burow, MD or one of the following Advanced Practice Providers on your designated Care Team:    Kerin Ransom, PA-C  [place], [place]  Coletta Memos, [place]   Other Instructions Please call our office 2 months in advance to schedule your follow-up appointment.

## 2019-09-24 ENCOUNTER — Encounter: Payer: Self-pay | Admitting: Vascular Surgery

## 2019-09-24 ENCOUNTER — Other Ambulatory Visit: Payer: Self-pay

## 2019-09-24 ENCOUNTER — Ambulatory Visit (INDEPENDENT_AMBULATORY_CARE_PROVIDER_SITE_OTHER): Payer: Self-pay | Admitting: Vascular Surgery

## 2019-09-24 VITALS — BP 112/64 | HR 88 | Temp 97.7°F | Resp 20 | Ht 64.0 in | Wt 229.0 lb

## 2019-09-24 DIAGNOSIS — I739 Peripheral vascular disease, unspecified: Secondary | ICD-10-CM

## 2019-09-24 NOTE — Progress Notes (Signed)
Patient name: [name] MRN: [id number] DOB: 25-Jan-1961 Sex: female  REASON FOR VISIT: Follow-up left femoral to anterior tibial bypass and left fifth toe ray amputation  HPI: [name] is a 58 y.o. female here for follow-up.  Is having bilateral lower extremity swelling but no pain.  She does have some bilateral peripheral neuropathy.  Her toe amputation is now completely healed  Current Outpatient Medications  Medication Sig Dispense Refill  . acetaminophen (TYLENOL) 500 MG tablet Take 2 tablets (1,000 mg total) by mouth every 6 (six) hours as needed for mild pain. 30 tablet 0  . albuterol (VENTOLIN HFA) 108 (90 Base) MCG/ACT inhaler Inhale 2 puffs into the lungs every 6 (six) hours as needed for wheezing or shortness of breath. 18 g 0  . amiodarone (PACERONE) 100 MG tablet Take 1 tablet (100 mg total) by mouth daily. (Patient taking differently: Take 50 mg by mouth daily. )    . amLODipine (NORVASC) 10 MG tablet Take 1 tablet (10 mg total) by mouth daily. 90 tablet 0  . apixaban (ELIQUIS) 5 MG TABS tablet Take 1 tablet (5 mg total) by mouth 2 (two) times daily. 60 tablet 0  . atorvastatin (LIPITOR) 40 MG tablet TAKE 1 TABLET BY MOUTH ONCE EVERY EVENING 30 tablet 0  . bisoprolol (ZEBETA) 5 MG tablet Take 1 tablet (5 mg total) by mouth daily. 30 tablet 5  . cholecalciferol (VITAMIN D) 1000 units tablet Take 1 tablet (1,000 Units total) by mouth daily.    . clopidogrel (PLAVIX) 75 MG tablet Take 1 tablet (75 mg total) by mouth daily.    . colchicine 0.6 MG tablet TAKE 2 TABLETS (1.2 MG) BY MOUTH INITIALLY, THEN TAKE 1 TAB (0.6 MG ) IN ONE HOUR    . docusate sodium (COLACE) 100 MG capsule Take 1 capsule (100 mg total) by mouth daily. 10 capsule 0  . folic acid (FOLVITE) 1 MG tablet Take 1 tablet (1 mg total) by mouth daily. 30 tablet 0  . Insulin Glargine (LANTUS SOLOSTAR) 100 UNIT/ML Solostar Pen Inject 30 Units into the skin at bedtime. 15 mL 11  .  Insulin Lispro (HUMALOG KWIKPEN) 200 UNIT/ML SOPN Inject 8 Units into the skin 3 (three) times daily with meals.    [name] levothyroxine (SYNTHROID, LEVOTHROID) 50 MCG tablet Take 1 tablet (50 mcg total) by mouth daily. 30 tablet 0  . Multiple Vitamin (MULTIVITAMIN WITH MINERALS) TABS tablet Take 1 tablet by mouth daily. 30 tablet 0  . nortriptyline (PAMELOR) 10 MG capsule Take 20 mg by mouth at bedtime.    . pantoprazole (PROTONIX) 40 MG tablet Take 1 tablet (40 mg total) by mouth daily.    [name] senna-docusate (SENOKOT-S) 8.6-50 MG tablet Take 2 tablets by mouth at bedtime as needed for mild constipation.    . thiamine 100 MG tablet Take 1 tablet (100 mg total) by mouth daily. 30 tablet 0  . torsemide (DEMADEX) 20 MG tablet Take 2 tablets (40 mg total) by mouth 2 (two) times daily. 120 tablet 3   No current facility-administered medications for this visit.      PHYSICAL EXAM: Vitals:   09/24/19 0952  BP: 112/64  Pulse: 88  Resp: 20  Temp: 97.7 F (36.5 C)  SpO2: 93%  Weight: 229 lb (103.9 kg)  Height: 5\' 4"  (1.626 m)    GENERAL: The patient is a well-nourished female, in no acute distress. The vital signs are documented above. Easily palpable femoral to anterior tibial  bypass pulse and also 2+ left dorsalis pedis pulse. Ray amputation completely healed with a slight callus still sloughing.  MEDICAL ISSUES: Stable overall.  We will continue her usual activities.  Will be seen in the PA clinic in 3 months with noninvasive studies.  I did discuss symptoms of graft occlusion and she knows to notify us immediately should this occur   Rosetta Posner, MD [facility] Tel [phone] Pager [phone]

## 2019-09-25 ENCOUNTER — Telehealth: Payer: Self-pay | Admitting: Cardiovascular Disease

## 2019-09-25 NOTE — Telephone Encounter (Signed)
   Primary Cardiologist: Quay Burow, MD  Spoke with patient and informed her that cataract extractions are recognized in guidelines as low risk surgeries that do not typically require specific preoperative testing or holding of blood thinner therapy. She was instructed not to stop her apixaban and plavix. Informed patient that if her surgeon had any concerns, they could request formal clearance and we'd be happy to send the above recommendations to their office. Patient was in agreement with the plan and appreciative of the call.  Abigail Butts, PA-C 09/25/2019, 3:28 PM

## 2019-09-25 NOTE — Telephone Encounter (Signed)
New Message     Pt is calling and says she is going to have Cataract surgery on her right eye on 11/20 at the surgical center  She says they told her she needs to stop taking her blood thinner 2 weeks before surgery   Please call

## 2019-09-30 ENCOUNTER — Telehealth: Payer: Self-pay | Admitting: Cardiovascular Disease

## 2019-09-30 ENCOUNTER — Other Ambulatory Visit: Payer: Self-pay | Admitting: Cardiology

## 2019-09-30 DIAGNOSIS — I480 Paroxysmal atrial fibrillation: Secondary | ICD-10-CM

## 2019-09-30 NOTE — Telephone Encounter (Signed)
Advised patient, verbalized understanding  

## 2019-09-30 NOTE — Telephone Encounter (Signed)
Patient would like Dr. Kennon Holter opinion on taking 3 tablets of her torsemide (DEMADEX) 20 MG tablet rather than 4. Please advise. She takes 2 in the morning and 2 at night.

## 2019-09-30 NOTE — Telephone Encounter (Signed)
That's fine with me. If she starts swelling of having increase in weight can go back to 4.  JJB

## 2019-09-30 NOTE — Telephone Encounter (Signed)
Spoke with patient and she take 2 Torsemide in am and around 12-1:00 pm. She states she is up all night urinating and would like to decrease if possible. Per patient some swelling but not bad, denies shortness of breath, and weight stable. Will forward to Dr Gwenlyn Found for review, aware he is at hospital working this week.

## 2019-10-01 ENCOUNTER — Other Ambulatory Visit: Payer: Self-pay

## 2019-10-01 DIAGNOSIS — I739 Peripheral vascular disease, unspecified: Secondary | ICD-10-CM

## 2019-10-02 ENCOUNTER — Other Ambulatory Visit (HOSPITAL_COMMUNITY): Payer: Self-pay | Admitting: Cardiology

## 2019-10-02 ENCOUNTER — Other Ambulatory Visit (HOSPITAL_COMMUNITY): Payer: Self-pay

## 2019-10-02 ENCOUNTER — Other Ambulatory Visit: Payer: Self-pay | Admitting: Cardiology

## 2019-10-02 DIAGNOSIS — I480 Paroxysmal atrial fibrillation: Secondary | ICD-10-CM

## 2019-10-02 DIAGNOSIS — I10 Essential (primary) hypertension: Secondary | ICD-10-CM

## 2019-10-02 MED ORDER — AMIODARONE HCL 100 MG PO TABS: 100.0000 mg | ORAL_TABLET | Freq: Every day | ORAL | 11 refills | Status: DC

## 2019-10-02 MED ORDER — AMLODIPINE BESYLATE 10 MG PO TABS: 10.0000 mg | ORAL_TABLET | Freq: Every day | ORAL | 0 refills | Status: DC

## 2019-10-26 ENCOUNTER — Other Ambulatory Visit (HOSPITAL_COMMUNITY): Payer: Self-pay | Admitting: Cardiology

## 2019-10-26 DIAGNOSIS — I10 Essential (primary) hypertension: Secondary | ICD-10-CM

## 2019-11-04 IMAGING — DX CHEST - 2 VIEW
2 series · 2 of 2 positions shown · non-contrast
Comparison: 11/27/2018

CLINICAL DATA: Smoker, cough and fever, hypertension and diabetes

EXAM:
CHEST - 2 VIEW

[chest pa]
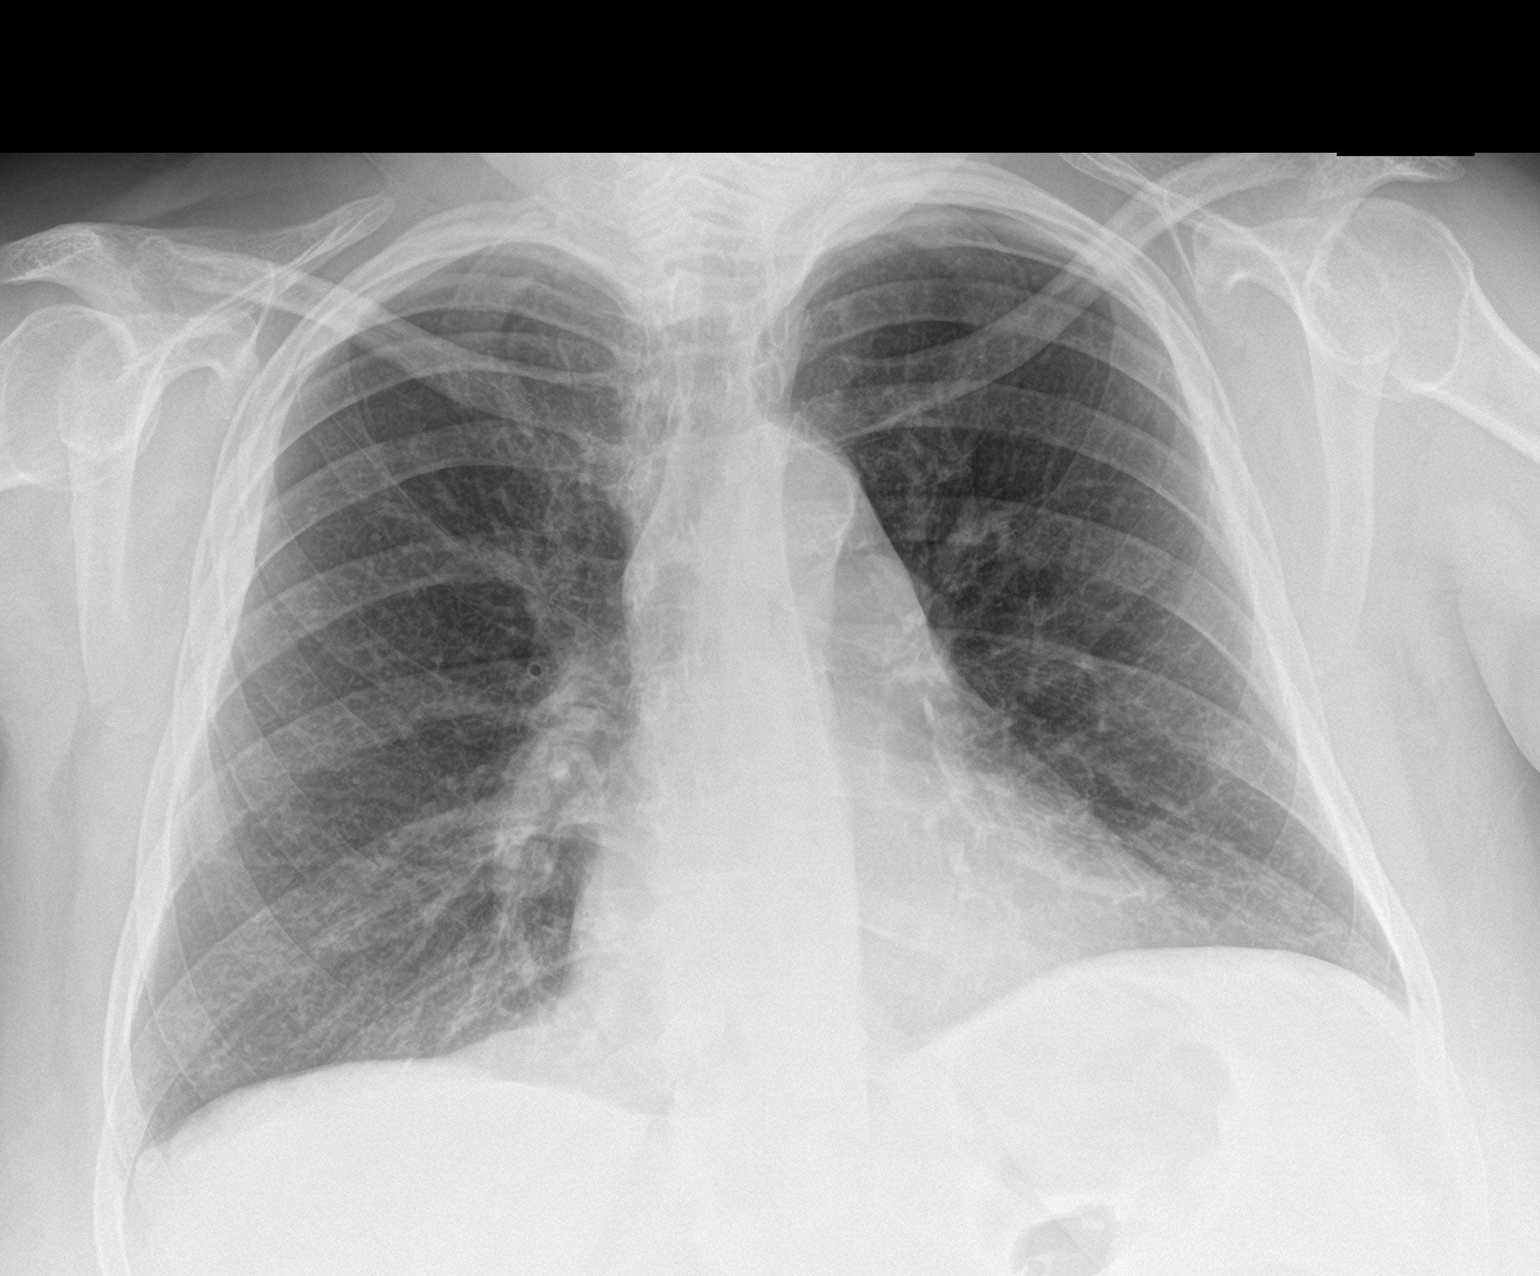

[chest lat]
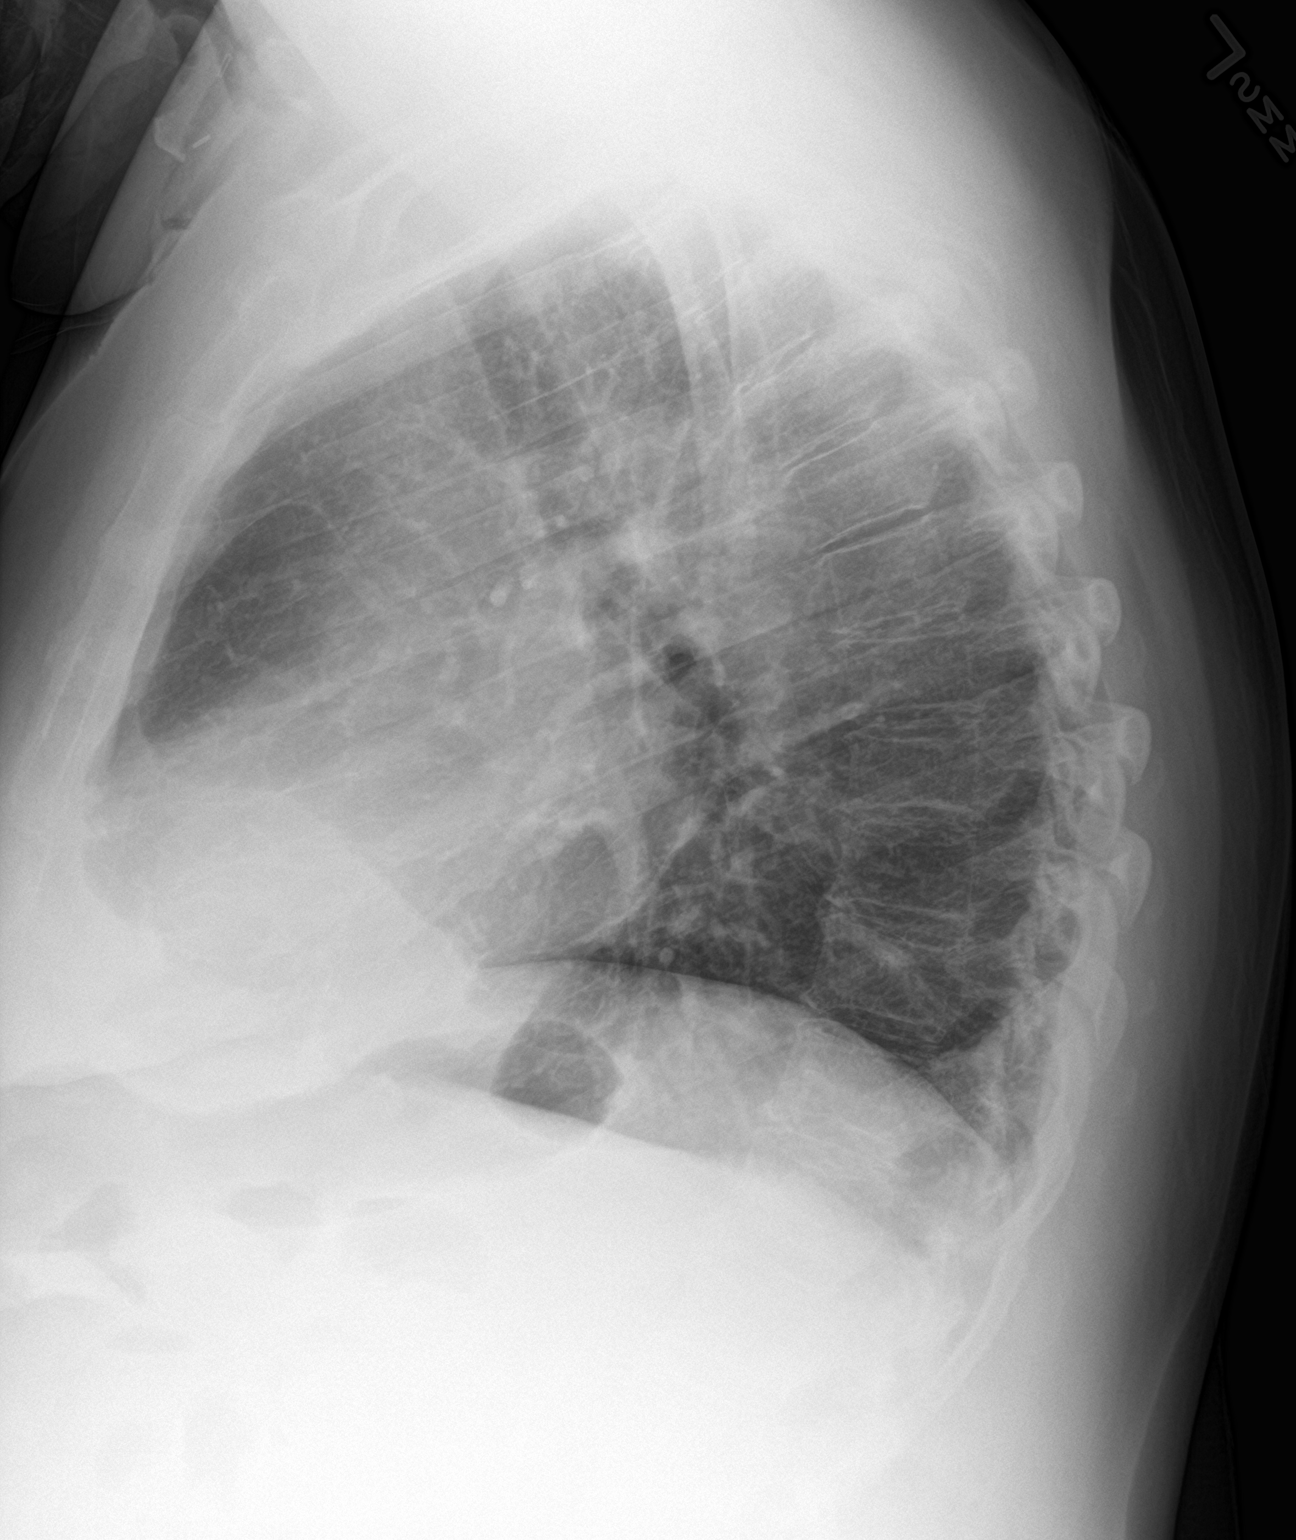

[2 of 2 positions shown; findings below may reference images not displayed]

FINDINGS: Borderline heart size without edema, significant airspace process,
pneumonia, collapse or consolidation. No effusion or pneumothorax.
Trachea midline. Aorta atherosclerotic.
IMPRESSION: Stable borderline cardiomegaly without acute process

Thoracic atherosclerosis

## 2019-11-25 ENCOUNTER — Encounter (HOSPITAL_COMMUNITY): Payer: Medicare Other

## 2019-11-28 ENCOUNTER — Other Ambulatory Visit (HOSPITAL_COMMUNITY): Payer: Self-pay | Admitting: Internal Medicine

## 2019-11-28 ENCOUNTER — Other Ambulatory Visit (HOSPITAL_COMMUNITY): Payer: Self-pay | Admitting: Cardiology

## 2019-11-28 DIAGNOSIS — I10 Essential (primary) hypertension: Secondary | ICD-10-CM

## 2019-12-01 ENCOUNTER — Inpatient Hospital Stay (HOSPITAL_COMMUNITY)
Admission: EM | Admit: 2019-12-01 | Discharge: 2019-12-09 | DRG: 291 | Disposition: A | Discharge status: Home Health | Payer: Medicare Other | Attending: Internal Medicine | Admitting: Internal Medicine

## 2019-12-01 ENCOUNTER — Emergency Department (HOSPITAL_COMMUNITY): Payer: Medicare Other

## 2019-12-01 DIAGNOSIS — I2721 Secondary pulmonary arterial hypertension: Secondary | ICD-10-CM | POA: Diagnosis present

## 2019-12-01 DIAGNOSIS — F1010 Alcohol abuse, uncomplicated: Secondary | ICD-10-CM | POA: Diagnosis present

## 2019-12-01 DIAGNOSIS — Z89421 Acquired absence of other right toe(s): Secondary | ICD-10-CM

## 2019-12-01 DIAGNOSIS — I130 Hypertensive heart and chronic kidney disease with heart failure and stage 1 through stage 4 chronic kidney disease, or unspecified chronic kidney disease: Principal | ICD-10-CM | POA: Diagnosis present

## 2019-12-01 DIAGNOSIS — I152 Hypertension secondary to endocrine disorders: Secondary | ICD-10-CM | POA: Diagnosis present

## 2019-12-01 DIAGNOSIS — Z79890 Hormone replacement therapy (postmenopausal): Secondary | ICD-10-CM

## 2019-12-01 DIAGNOSIS — I5043 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) and diastolic (congestive) heart failure: Secondary | ICD-10-CM | POA: Diagnosis present

## 2019-12-01 DIAGNOSIS — Z832 Family history of diseases of the blood and blood-forming organs and certain disorders involving the immune mechanism: Secondary | ICD-10-CM

## 2019-12-01 DIAGNOSIS — F17210 Nicotine dependence, cigarettes, uncomplicated: Secondary | ICD-10-CM | POA: Diagnosis present

## 2019-12-01 DIAGNOSIS — E669 Obesity, unspecified: Secondary | ICD-10-CM | POA: Diagnosis not present

## 2019-12-01 DIAGNOSIS — I509 Heart failure, unspecified: Secondary | ICD-10-CM | POA: Diagnosis present

## 2019-12-01 DIAGNOSIS — Z89422 Acquired absence of other left toe(s): Secondary | ICD-10-CM

## 2019-12-01 DIAGNOSIS — F329 Major depressive disorder, single episode, unspecified: Secondary | ICD-10-CM | POA: Diagnosis present

## 2019-12-01 DIAGNOSIS — I480 Paroxysmal atrial fibrillation: Secondary | ICD-10-CM | POA: Diagnosis present

## 2019-12-01 DIAGNOSIS — Z803 Family history of malignant neoplasm of breast: Secondary | ICD-10-CM

## 2019-12-01 DIAGNOSIS — J441 Chronic obstructive pulmonary disease with (acute) exacerbation: Secondary | ICD-10-CM | POA: Diagnosis present

## 2019-12-01 DIAGNOSIS — N179 Acute kidney failure, unspecified: Secondary | ICD-10-CM | POA: Diagnosis present

## 2019-12-01 DIAGNOSIS — T380X5A Adverse effect of glucocorticoids and synthetic analogues, initial encounter: Secondary | ICD-10-CM | POA: Diagnosis not present

## 2019-12-01 DIAGNOSIS — I739 Peripheral vascular disease, unspecified: Secondary | ICD-10-CM | POA: Diagnosis not present

## 2019-12-01 DIAGNOSIS — E1165 Type 2 diabetes mellitus with hyperglycemia: Secondary | ICD-10-CM | POA: Diagnosis not present

## 2019-12-01 DIAGNOSIS — I5042 Chronic combined systolic (congestive) and diastolic (congestive) heart failure: Secondary | ICD-10-CM | POA: Diagnosis not present

## 2019-12-01 DIAGNOSIS — E119 Type 2 diabetes mellitus without complications: Secondary | ICD-10-CM | POA: Diagnosis present

## 2019-12-01 DIAGNOSIS — E1151 Type 2 diabetes mellitus with diabetic peripheral angiopathy without gangrene: Secondary | ICD-10-CM | POA: Diagnosis present

## 2019-12-01 DIAGNOSIS — E1122 Type 2 diabetes mellitus with diabetic chronic kidney disease: Secondary | ICD-10-CM | POA: Diagnosis present

## 2019-12-01 DIAGNOSIS — I10 Essential (primary) hypertension: Secondary | ICD-10-CM | POA: Diagnosis present

## 2019-12-01 DIAGNOSIS — E039 Hypothyroidism, unspecified: Secondary | ICD-10-CM | POA: Diagnosis present

## 2019-12-01 DIAGNOSIS — Z66 Do not resuscitate: Secondary | ICD-10-CM | POA: Diagnosis present

## 2019-12-01 DIAGNOSIS — Z20822 Contact with and (suspected) exposure to covid-19: Secondary | ICD-10-CM | POA: Diagnosis present

## 2019-12-01 DIAGNOSIS — E785 Hyperlipidemia, unspecified: Secondary | ICD-10-CM | POA: Diagnosis present

## 2019-12-01 DIAGNOSIS — Z7901 Long term (current) use of anticoagulants: Secondary | ICD-10-CM

## 2019-12-01 DIAGNOSIS — K219 Gastro-esophageal reflux disease without esophagitis: Secondary | ICD-10-CM | POA: Diagnosis present

## 2019-12-01 DIAGNOSIS — K7030 Alcoholic cirrhosis of liver without ascites: Secondary | ICD-10-CM | POA: Diagnosis present

## 2019-12-01 DIAGNOSIS — E1159 Type 2 diabetes mellitus with other circulatory complications: Secondary | ICD-10-CM | POA: Diagnosis present

## 2019-12-01 DIAGNOSIS — B961 Klebsiella pneumoniae [K. pneumoniae] as the cause of diseases classified elsewhere: Secondary | ICD-10-CM | POA: Diagnosis present

## 2019-12-01 DIAGNOSIS — E538 Deficiency of other specified B group vitamins: Secondary | ICD-10-CM | POA: Diagnosis present

## 2019-12-01 DIAGNOSIS — G4733 Obstructive sleep apnea (adult) (pediatric): Secondary | ICD-10-CM | POA: Diagnosis present

## 2019-12-01 DIAGNOSIS — Z825 Family history of asthma and other chronic lower respiratory diseases: Secondary | ICD-10-CM

## 2019-12-01 DIAGNOSIS — I429 Cardiomyopathy, unspecified: Secondary | ICD-10-CM | POA: Diagnosis present

## 2019-12-01 DIAGNOSIS — I70202 Unspecified atherosclerosis of native arteries of extremities, left leg: Secondary | ICD-10-CM | POA: Diagnosis present

## 2019-12-01 DIAGNOSIS — Z833 Family history of diabetes mellitus: Secondary | ICD-10-CM

## 2019-12-01 DIAGNOSIS — I5023 Acute on chronic systolic (congestive) heart failure: Secondary | ICD-10-CM | POA: Diagnosis not present

## 2019-12-01 DIAGNOSIS — Z8249 Family history of ischemic heart disease and other diseases of the circulatory system: Secondary | ICD-10-CM

## 2019-12-01 DIAGNOSIS — N390 Urinary tract infection, site not specified: Secondary | ICD-10-CM | POA: Diagnosis present

## 2019-12-01 DIAGNOSIS — Z888 Allergy status to other drugs, medicaments and biological substances status: Secondary | ICD-10-CM

## 2019-12-01 DIAGNOSIS — Z794 Long term (current) use of insulin: Secondary | ICD-10-CM | POA: Diagnosis present

## 2019-12-01 DIAGNOSIS — N1832 Chronic kidney disease, stage 3b: Secondary | ICD-10-CM | POA: Diagnosis present

## 2019-12-01 DIAGNOSIS — Z79899 Other long term (current) drug therapy: Secondary | ICD-10-CM

## 2019-12-01 DIAGNOSIS — I2510 Atherosclerotic heart disease of native coronary artery without angina pectoris: Secondary | ICD-10-CM | POA: Diagnosis present

## 2019-12-01 DIAGNOSIS — E1169 Type 2 diabetes mellitus with other specified complication: Secondary | ICD-10-CM | POA: Diagnosis not present

## 2019-12-01 DIAGNOSIS — R4182 Altered mental status, unspecified: Secondary | ICD-10-CM

## 2019-12-01 DIAGNOSIS — Z7902 Long term (current) use of antithrombotics/antiplatelets: Secondary | ICD-10-CM

## 2019-12-01 DIAGNOSIS — N183 Chronic kidney disease, stage 3 unspecified: Secondary | ICD-10-CM | POA: Diagnosis present

## 2019-12-01 DIAGNOSIS — Z6839 Body mass index (BMI) 39.0-39.9, adult: Secondary | ICD-10-CM

## 2019-12-01 DIAGNOSIS — J9601 Acute respiratory failure with hypoxia: Secondary | ICD-10-CM | POA: Diagnosis present

## 2019-12-01 DIAGNOSIS — I5033 Acute on chronic diastolic (congestive) heart failure: Secondary | ICD-10-CM | POA: Diagnosis not present

## 2019-12-01 DIAGNOSIS — I248 Other forms of acute ischemic heart disease: Secondary | ICD-10-CM | POA: Diagnosis present

## 2019-12-01 HISTORY — DX: Dyspnea, unspecified: R06.00

## 2019-12-01 HISTORY — DX: Chronic kidney disease, stage 3 unspecified: N18.30

## 2019-12-01 LAB — COMPREHENSIVE METABOLIC PANEL
ALT: 83 U/L — ABNORMAL HIGH (ref 0–44)
AST: 60 U/L — ABNORMAL HIGH (ref 15–41)
Albumin: 2.7 g/dL — ABNORMAL LOW (ref 3.5–5.0)
Alkaline Phosphatase: 438 U/L — ABNORMAL HIGH (ref 38–126)
Anion gap: 11 (ref 5–15)
BUN: 43 mg/dL — ABNORMAL HIGH (ref 6–20)
CO2: 23 mmol/L (ref 22–32)
Calcium: 8.3 mg/dL — ABNORMAL LOW (ref 8.9–10.3)
Chloride: 108 mmol/L (ref 98–111)
Creatinine, Ser: 1.85 mg/dL — ABNORMAL HIGH (ref 0.44–1.00)
GFR calc Af Amer: 34 mL/min — ABNORMAL LOW (ref >60)
GFR calc non Af Amer: 30 mL/min — ABNORMAL LOW (ref >60)
Glucose, Bld: 103 mg/dL — ABNORMAL HIGH (ref 70–99)
Potassium: 4.4 mmol/L (ref 3.5–5.1)
Sodium: 142 mmol/L (ref 135–145)
Total Bilirubin: 0.7 mg/dL (ref 0.3–1.2)
Total Protein: 6.7 g/dL (ref 6.5–8.1)

## 2019-12-01 LAB — POCT I-STAT 7, (LYTES, BLD GAS, ICA,H+H)
Acid-Base Excess: 2 mmol/L (ref 0.0–2.0)
Bicarbonate: 26 mmol/L (ref 20.0–28.0)
Bicarbonate: 24.4 mmol/L (ref 20.0–28.0)
Calcium, Ion: 1.1 mmol/L — ABNORMAL LOW (ref 1.15–1.40)
Calcium, Ion: 1.11 mmol/L — ABNORMAL LOW (ref 1.15–1.40)
HCT: 23 % — ABNORMAL LOW (ref 36.0–46.0)
HCT: 28 % — ABNORMAL LOW (ref 36.0–46.0)
Hemoglobin: 7.8 g/dL — ABNORMAL LOW (ref 12.0–15.0)
Hemoglobin: 9.5 g/dL — ABNORMAL LOW (ref 12.0–15.0)
O2 Saturation: 74 %
O2 Saturation: 93 %
Patient temperature: 98.3
Patient temperature: 98.8
Potassium: 4.2 mmol/L (ref 3.5–5.1)
Potassium: 4 mmol/L (ref 3.5–5.1)
Sodium: 142 mmol/L (ref 135–145)
Sodium: 144 mmol/L (ref 135–145)
TCO2: 27 mmol/L (ref 22–32)
TCO2: 26 mmol/L (ref 22–32)
pCO2 arterial: 37.3 mmHg (ref 32.0–48.0)
pCO2 arterial: 38.4 mmHg (ref 32.0–48.0)
pH, Arterial: 7.45 (ref 7.350–7.450)
pH, Arterial: 7.412 (ref 7.350–7.450)
pO2, Arterial: 37 mmHg — CL (ref 83.0–108.0)
pO2, Arterial: 67 mmHg — ABNORMAL LOW (ref 83.0–108.0)

## 2019-12-01 LAB — CBC WITH DIFFERENTIAL/PLATELET
Abs Immature Granulocytes: 0.03 10*3/uL (ref 0.00–0.07)
Basophils Absolute: 0 10*3/uL (ref 0.0–0.1)
Basophils Relative: 0 %
Eosinophils Absolute: 0.1 10*3/uL (ref 0.0–0.5)
Eosinophils Relative: 2 %
HCT: 28.4 % — ABNORMAL LOW (ref 36.0–46.0)
Hemoglobin: 8.5 g/dL — ABNORMAL LOW (ref 12.0–15.0)
Immature Granulocytes: 0 %
Lymphocytes Relative: 16 %
Lymphs Abs: 1.3 10*3/uL (ref 0.7–4.0)
MCH: 28.3 pg (ref 26.0–34.0)
MCHC: 29.9 g/dL — ABNORMAL LOW (ref 30.0–36.0)
MCV: 94.7 fL (ref 80.0–100.0)
Monocytes Absolute: 0.6 10*3/uL (ref 0.1–1.0)
Monocytes Relative: 8 %
Neutro Abs: 6 10*3/uL (ref 1.7–7.7)
Neutrophils Relative %: 74 %
Platelets: 299 10*3/uL (ref 150–400)
RBC: 3 MIL/uL — ABNORMAL LOW (ref 3.87–5.11)
RDW: 16.7 % — ABNORMAL HIGH (ref 11.5–15.5)
WBC: 8 10*3/uL (ref 4.0–10.5)
nRBC: 0 % (ref 0.0–0.2)

## 2019-12-01 LAB — BRAIN NATRIURETIC PEPTIDE: B Natriuretic Peptide: 938.7 pg/mL — ABNORMAL HIGH (ref 0.0–100.0)

## 2019-12-01 LAB — RESPIRATORY PANEL BY RT PCR (FLU A&B, COVID)
Influenza A by PCR: NEGATIVE
Influenza B by PCR: NEGATIVE
SARS Coronavirus 2 by RT PCR: NEGATIVE

## 2019-12-01 LAB — TROPONIN I (HIGH SENSITIVITY): Troponin I (High Sensitivity): 22 ng/L — ABNORMAL HIGH (ref <18)

## 2019-12-01 MED ORDER — ALBUTEROL (5 MG/ML) CONTINUOUS INHALATION SOLN
10.0000 mg/h | INHALATION_SOLUTION | RESPIRATORY_TRACT | Status: AC
  Administered 2019-12-01: 22:00:00 10 mg/h via RESPIRATORY_TRACT
  Filled 2019-12-01: qty 20

## 2019-12-01 MED ORDER — FUROSEMIDE 10 MG/ML IJ SOLN
40.0000 mg | INTRAMUSCULAR | Status: AC
  Administered 2019-12-01: 40 mg via INTRAVENOUS
  Filled 2019-12-01: qty 4

## 2019-12-01 MED ORDER — ALBUTEROL SULFATE HFA 108 (90 BASE) MCG/ACT IN AERS
4.0000 | INHALATION_SPRAY | RESPIRATORY_TRACT | Status: AC
  Administered 2019-12-01: 19:00:00 4 via RESPIRATORY_TRACT
  Filled 2019-12-01: qty 6.7

## 2019-12-01 MED ORDER — METHYLPREDNISOLONE SODIUM SUCC 125 MG IJ SOLR
125.0000 mg | Freq: Once | INTRAMUSCULAR | Status: AC
  Administered 2019-12-01: 19:00:00 125 mg via INTRAVENOUS
  Filled 2019-12-01: qty 2

## 2019-12-01 NOTE — ED Triage Notes (Signed)
Pt here via [facility] from home where her family states she has been lethargic and "just sitting" for 4 days.  Breathing labored, though sats remain 100% on RA.  Pt ao x 4.  Wheezing noted.

## 2019-12-01 NOTE — ED Provider Notes (Addendum)
[facility] EMERGENCY DEPARTMENT Provider Note   CSN: [id number] Arrival date & time: 12/01/19  1758     History Chief Complaint  Patient presents with   Fatigue   Shortness of Breath    [name] is a 59 y.o. female.  HPI   This patient is a ill-appearing 59 year old female, she has a known history of alcohol abuse with cirrhosis of the liver, history of coronary disease, she has had a cardiomyopathy as well as a history of congestive heart failure, cocaine abuse and COPD.  She continues to smoke, she smokes approximately half a pack of cigarettes per day.  According to the paramedics who are the initial historians the patient was sent from home where her family had called because she had been getting more lethargic over the last several days sitting around, not being able to move, having more labored breathing and looking like she was having more trouble breathing.  There is no other information available on arrival and the patient is not able to give me much in the way of information, she appears extremely weak, sleepy, she has labored breathing.  Level 5 caveat applies due to severity of illness  Past Medical History:  Diagnosis Date   Alcohol abuse    Alcoholic cirrhosis ([place])    Anemia    Anxiety    Arthritis    "knees" (11/26/2018)   B12 deficiency    CAD (coronary artery disease)    a. cath 11/10/2014 LM nl, LAD min irregs, D1 30 ost, D2 50d, LCX 59m, OM1 80 p/m (1.5 mm vessel), OM2 79m, RCA nondom 35m-->med rx.. Demand ischemia in the setting of rapid a-fib.   Cardiomyopathy ([place])    Carotid artery disease ([place])    a. 01/2015 Carotid Angio: RICA [id number], LICA [id number]. s/p L carotid endarterectomy 02/2015.   Cellulitis    lower extremities   Chronic combined systolic and diastolic CHF (congestive heart failure) ([place])    a. Last echo 12/205: EF 40-45%, inferoapical/posterior HK, not technically sufficient to allow eval of LV diastolic function,  normal LA in size..   Cocaine abuse ([name])    COPD (chronic obstructive pulmonary disease) ([name])    Critical lower limb ischemia    Depression    Diabetic peripheral neuropathy ([name])    Elevated troponin    a. Chronic elevation.   GERD (gastroesophageal reflux disease)    Hyperlipemia    Hypertension    Hypokalemia    Hypomagnesemia    Hypothyroidism    Marijuana abuse    Narcotic abuse ([place])    Noncompliance    NSVT (nonsustained ventricular tachycardia) ([name])    Obesity    PAF (paroxysmal atrial fibrillation) ([name])    Paroxysmal atrial tachycardia ([name])    Peripheral arterial disease ([place])    a. 01/2015 Angio/PTA: LSFA 100 w/ recon @ adductor canal and 1 vessel runoff via AT, RSFA 99 (atherectomy/pta) - 1 vessel runoff via diff dzs peroneal.   Pneumonia    "once or twice" (11/26/2018)   Poorly controlled type 2 diabetes mellitus ([place])    Renal insufficiency    a. 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   Sleep apnea    "couldn't handle wearing the mask" (11/26/2018)   Symptomatic bradycardia    avoid AV blocking agent per EP   Tobacco abuse     Patient Active Problem List   Diagnosis Date Noted   Cellulitis in diabetic foot ([place]) 07/08/2019   Osteomyelitis ([place]) 06/21/2019   NICM (  nonischemic cardiomyopathy) ([place]) 06/20/2019   Non-healing ulcer ([place]) 06/20/2019   Acute CHF (congestive heart failure) ([place]) 11/26/2018   CHF (congestive heart failure) ([place]) 11/26/2018   Acute respiratory failure ([place]) 10/21/2018   AKI (acute kidney injury) ([place]) 08/18/2018   Coagulation disorder ([place]) 08/09/2017   Depression 07/21/2017   At risk for adverse drug reaction 06/20/2017   Peripheral neuropathy 06/20/2017   Acute osteomyelitis of right foot ([place]) 06/13/2017   S/P transmetatarsal amputation of foot, right ([place]) 06/05/2017   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 ([place]) 05/19/2017   Femoro-popliteal  artery disease ([place])    SIRS (systemic inflammatory response syndrome) ([place]) 04/06/2017   CKD (chronic kidney disease), stage III ([place]) 11/24/2016   Suspected sleep apnea 11/24/2016   Ulcer of toe of right foot, with necrosis of bone ([place]) 10/27/2016   Ulcer of left lower leg ([place]) 05/19/2016   Severe obesity (BMI >= 40) ([place]) 02/24/2016   COPD GOLD 0  02/24/2016   Encounter for therapeutic drug monitoring 02/10/2016   Symptomatic bradycardia 01/12/2016   Essential hypertension 12/22/2015   Chronic combined systolic and diastolic CHF (congestive heart failure) ([place])    [place]    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 11/08/2015   Tobacco abuse 10/23/2015   Coronary artery disease    DOE (dyspnea on exertion) 04/29/2015   Diabetes mellitus type 2, uncontrolled ([place]) 02/08/2015   Influenza A 02/07/2015   Wrist fracture, left, with routine healing, subsequent encounter 02/05/2015   Wrist fracture, left, closed, initial encounter 01/29/2015   PAF (paroxysmal atrial fibrillation) ([place]) 01/16/2015   Carotid arterial disease ([place]) 01/16/2015   Claudication ([place]) 01/15/2015   Demand ischemia ([place]) 10/29/2014   Insomnia 02/03/2014   S/P peripheral artery angioplasty - [place] atherectomy; R SFA 09/11/2013    Class: Acute   Leg pain, bilateral 08/19/2013   Hypothyroidism 07/31/2013   Cellulitis 06/13/2013   History of cocaine abuse ([place]) 06/13/2013   Long term current use of anticoagulant therapy 05/20/2013   Alcohol abuse    Narcotic abuse ([place])    Marijuana abuse    Alcoholic cirrhosis ([place])    DM (diabetes mellitus), type 2 with peripheral vascular complications [facility])     Past Surgical History:  Procedure Laterality Date   ABDOMINAL AORTOGRAM N/A 06/26/2019   Procedure: ABDOMINAL AORTOGRAM;  Surgeon: Wellington Hampshire, MD;  Location: [place] CV LAB;  Service: Cardiovascular;  Laterality: N/A;   AMPUTATION Right 06/14/2017   Procedure: Right foot  transmetatarsal amputation;  Surgeon: Newt Minion, MD;  Location: [place];  Service: Orthopedics;  Laterality: Right;   AMPUTATION Left 06/28/2019   Procedure: AMPUTATION LEFT FIFTH TOE;  Surgeon: Rosetta Posner, MD;  Location: [place];  Service: Vascular;  Laterality: Left;   AMPUTATION TOE Right 04/28/2017   Procedure: AMPUTATION OF RIGHT SECOND RAY;  Surgeon: Newt Minion, MD;  Location: [place];  Service: Orthopedics;  Laterality: Right;   CARDIAC CATHETERIZATION     CARDIAC CATHETERIZATION N/A 01/12/2016   Procedure: Temporary Wire;  Surgeon: Minus Breeding, MD;  Location: [place] CV LAB;  Service: Cardiovascular;  Laterality: N/A;   CARDIOVERSION  ~ 02/2013   "twice"    CAROTID ANGIOGRAM N/A 01/15/2015   Procedure: CAROTID ANGIOGRAM;  Surgeon: [name], MD;  Location: [facility] CATH LAB;  Service: Cardiovascular;  Laterality: N/A;   DILATION AND CURETTAGE OF UTERUS  1988   ENDARTERECTOMY Left 02/19/2015   Procedure: LEFT CAROTID ENDARTERECTOMY ;  Surgeon: Serafina Mitchell, MD;  [facility];  Service: Vascular;  Laterality: Left;   FEMORAL-TIBIAL BYPASS GRAFT Left 06/28/2019   Procedure: BYPASS GRAFT LEFT LEG FEMORAL TO ANTERIOR TIBIAL ARTERY using LEFT GREATER SAPHENOUS VEIN;  Surgeon: Rosetta Posner, MD;  Location: [place];  Service: Vascular;  Laterality: Left;   LEFT HEART CATHETERIZATION WITH CORONARY ANGIOGRAM N/A 10/31/2014   Procedure: LEFT HEART CATHETERIZATION WITH CORONARY ANGIOGRAM;  Surgeon: Burnell Blanks, MD;  Location: [facility] CATH LAB;  Service: Cardiovascular;  Laterality: N/A;   LOWER EXTREMITY ANGIOGRAM N/A 09/10/2013   Procedure: LOWER EXTREMITY ANGIOGRAM;  Surgeon: [name], MD;  Location: [facility] CATH LAB;  Service: Cardiovascular;  Laterality: N/A;   LOWER EXTREMITY ANGIOGRAM N/A 01/15/2015   Procedure: LOWER EXTREMITY ANGIOGRAM;  Surgeon: [name], MD;  Location: [facility] CATH LAB;  Service: Cardiovascular;  Laterality: N/A;   LOWER EXTREMITY  ANGIOGRAPHY N/A 04/13/2017   Procedure: Lower Extremity Angiography;  Surgeon: [name], MD;  Location: [place] CV LAB;  Service: Cardiovascular;  Laterality: N/A;   LOWER EXTREMITY ANGIOGRAPHY Left 06/26/2019   Procedure: LOWER EXTREMITY ANGIOGRAPHY;  Surgeon: Wellington Hampshire, MD;  Location: [place] CV LAB;  Service: Cardiovascular;  Laterality: Left;   PERIPHERAL VASCULAR BALLOON ANGIOPLASTY Left 06/26/2019   Procedure: PERIPHERAL VASCULAR BALLOON ANGIOPLASTY;  Surgeon: Wellington Hampshire, MD;  Location: [place] CV LAB;  Service: Cardiovascular;  Laterality: Left;  unable to cross lt sfa occlusion   PERIPHERAL VASCULAR INTERVENTION  04/13/2017   Procedure: Peripheral Vascular Intervention;  Surgeon: [name], MD;  Location: [place] CV LAB;  Service: Cardiovascular;;   VEIN HARVEST Left 06/28/2019   Procedure: LEFT LEG GREATER SAPHENOUS VEIN HARVEST;  Surgeon: Rosetta Posner, MD;  Location: [facility] OR;  Service: Vascular;  Laterality: Left;     OB History    Gravida  1   Para  1   Term  1   Preterm      AB      Living  1     SAB      TAB      Ectopic      Multiple      Live Births              Family History  Problem Relation Age of Onset   Hypertension Mother    Diabetes Mother    Cancer Mother        breast, ovarian, colon   Clotting disorder Mother    Heart disease Mother    Heart attack Mother    Breast cancer Mother        in 18's   Hypertension Father    Heart disease Father    Emphysema Sister        smoked    Social History   Tobacco Use   Smoking status: Current Some Day Smoker    Packs/day: 1.00    Years: 44.00    Pack years: 44.00    Types: E-cigarettes, Cigarettes   Smokeless tobacco: Former Systems developer    Types: Snuff  Substance Use Topics   Alcohol use: Yes    Comment: 11/26/2018 "couple drinks/month maybe"   Drug use: Yes    Types: "Crack" cocaine, Marijuana, Oxycodone    Comment: last used 3  days ago     Home Medications Prior to Admission medications   Medication Sig Start Date End Date Taking? Authorizing Provider  acetaminophen (TYLENOL) 500 MG tablet Take  2 tablets (1,000 mg total) by mouth every 6 (six) hours as needed for mild pain. 07/08/19   Desiree Hane, MD  albuterol (VENTOLIN HFA) 108 (90 Base) MCG/ACT inhaler Inhale 2 puffs into the lungs every 6 (six) hours as needed for wheezing or shortness of breath. 07/08/19   Oretha Milch D, MD  amiodarone (PACERONE) 100 MG tablet Take 1 tablet (100 mg total) by mouth daily. 10/02/19   Bensimhon, Shaune Pascal, MD  amLODipine (NORVASC) 10 MG tablet TAKE 1 TABLET BY MOUTH DAILY.(AM) 11/28/19   Clegg, [name] D, NP  apixaban (ELIQUIS) 5 MG TABS tablet Take 1 tablet (5 mg total) by mouth 2 (two) times daily. 07/08/19   Oretha Milch D, MD  atorvastatin (LIPITOR) 40 MG tablet TAKE 1 TABLET BY MOUTH ONCE EVERY EVENING 07/08/19   Oretha Milch D, MD  bisoprolol (ZEBETA) 5 MG tablet TAKE 1 TABLET (5 MG TOTAL) BY MOUTH DAILY. (AM) 11/28/19   Larey Dresser, MD  cholecalciferol (VITAMIN D) 1000 units tablet Take 1 tablet (1,000 Units total) by mouth daily. 07/08/19   Oretha Milch D, MD  clopidogrel (PLAVIX) 75 MG tablet Take 1 tablet (75 mg total) by mouth daily. 07/08/19   Oretha Milch D, MD  colchicine 0.6 MG tablet TAKE 2 TABLETS (1.2 MG) BY MOUTH INITIALLY, THEN TAKE 1 TAB (0.6 MG ) IN ONE HOUR 09/23/19   [provider]  docusate sodium (COLACE) 100 MG capsule Take 1 capsule (100 mg total) by mouth daily. 07/09/19   Desiree Hane, MD  folic acid (FOLVITE) 1 MG tablet Take 1 tablet (1 mg total) by mouth daily. 07/08/19   Desiree Hane, MD  Insulin Glargine (LANTUS SOLOSTAR) 100 UNIT/ML Solostar Pen Inject 30 Units into the skin at bedtime. 07/08/19   Oretha Milch D, MD  Insulin Lispro (HUMALOG KWIKPEN) 200 UNIT/ML SOPN Inject 8 Units into the skin 3 (three) times daily with meals. 07/08/19   Desiree Hane, MD  levothyroxine  (SYNTHROID, LEVOTHROID) 50 MCG tablet Take 1 tablet (50 mcg total) by mouth daily. 08/09/17   Medina-Vargas, Monina C, NP  Multiple Vitamin (MULTIVITAMIN WITH MINERALS) TABS tablet Take 1 tablet by mouth daily. 10/25/18   Kayleen Memos, DO  nortriptyline (PAMELOR) 10 MG capsule Take 20 mg by mouth at bedtime.    [provider]  pantoprazole (PROTONIX) 40 MG tablet Take 1 tablet (40 mg total) by mouth daily. 07/09/19   Desiree Hane, MD  senna-docusate (SENOKOT-S) 8.6-50 MG tablet Take 2 tablets by mouth at bedtime as needed for mild constipation. 07/08/19   Oretha Milch D, MD  thiamine 100 MG tablet Take 1 tablet (100 mg total) by mouth daily. 07/08/19   Oretha Milch D, MD  torsemide (DEMADEX) 20 MG tablet TAKE 2 TABLETS BY MOUTH 2 (TWO) TIMES DAILY. 11/28/19   Larey Dresser, MD    Allergies    Gabapentin and Lyrica [pregabalin]  Review of Systems   Review of Systems  Unable to perform ROS: Acuity of condition    Physical Exam Updated Vital Signs LMP 11/27/2012   Physical Exam Vitals and nursing note reviewed.  Constitutional:      General: She is in acute distress.     Appearance: She is obese.     Comments: Somnolent  HENT:     Head: Normocephalic and atraumatic.     Mouth/Throat:     Pharynx: No oropharyngeal exudate.  Eyes:     General: No scleral icterus.  Right eye: No discharge.        Left eye: No discharge.     Conjunctiva/sclera: Conjunctivae normal.     Pupils: Pupils are equal, round, and reactive to light.  Neck:     Thyroid: No thyromegaly.     Vascular: No JVD.  Cardiovascular:     Rate and Rhythm: Normal rate and regular rhythm.     Heart sounds: Normal heart sounds. No murmur. No friction rub. No gallop.   Pulmonary:     Effort: Respiratory distress present.     Breath sounds: Wheezing present. No rales.     Comments: The patient has a prolonged expiratory phase, she is tachypneic, she speaks in shortened sentences Abdominal:      General: Bowel sounds are normal. There is no distension.     Palpations: Abdomen is soft. There is no mass.     Tenderness: There is no abdominal tenderness.  Musculoskeletal:        General: No tenderness. Normal range of motion.     Cervical back: Normal range of motion and neck supple.     Right lower leg: Edema present.     Left lower leg: Edema present.  Lymphadenopathy:     Cervical: No cervical adenopathy.  Skin:    General: Skin is warm and dry.     Findings: No erythema or rash.  Neurological:     Coordination: Coordination normal.     Comments: The patient is diffusely generally weak, when she is not talking her head slumped forward onto her chest.  She is maintaining her airway and able to move all 4 extremities with grips and able to move her feet but she is severely weak and debilitated  Psychiatric:        Behavior: Behavior normal.     ED Results / Procedures / Treatments   Labs (all labs ordered are listed, but only abnormal results are displayed) Labs Reviewed  BRAIN NATRIURETIC PEPTIDE - Abnormal; Notable for the following components:      Result Value   B Natriuretic Peptide 938.7 (*)    All other components within normal limits  CBC WITH DIFFERENTIAL/PLATELET - Abnormal; Notable for the following components:   RBC 3.00 (*)    Hemoglobin 8.5 (*)    HCT 28.4 (*)    MCHC 29.9 (*)    RDW 16.7 (*)    All other components within normal limits  COMPREHENSIVE METABOLIC PANEL - Abnormal; Notable for the following components:   Glucose, Bld 103 (*)    BUN 43 (*)    Creatinine, Ser 1.85 (*)    Calcium 8.3 (*)    Albumin 2.7 (*)    AST 60 (*)    ALT 83 (*)    Alkaline Phosphatase 438 (*)    GFR calc non Af Amer 30 (*)    GFR calc Af Amer 34 (*)    All other components within normal limits  POCT I-STAT 7, (LYTES, BLD GAS, ICA,H+H) - Abnormal; Notable for the following components:   pO2, Arterial 37.0 (*)    Calcium, Ion 1.10 (*)    HCT 23.0 (*)     Hemoglobin 7.8 (*)    All other components within normal limits  TROPONIN I (HIGH SENSITIVITY) - Abnormal; Notable for the following components:   Troponin I (High Sensitivity) 22 (*)    All other components within normal limits  RESPIRATORY PANEL BY RT PCR (FLU A&B, COVID)  URINE CULTURE  BLOOD GAS,  ARTERIAL  URINALYSIS, ROUTINE W REFLEX MICROSCOPIC  TROPONIN I (HIGH SENSITIVITY)    EKG EKG Interpretation  Date/Time:  Sunday December 01 2019 18:15:56 EST Ventricular Rate:  76 PR Interval:    QRS Duration: 112 QT Interval:  427 QTC Calculation: 481 R Axis:   14 Text Interpretation: Normal sinus rhythm Anterior infarct, old Minimal ST depression, lateral leads since last tracing no significant change Confirmed by Noemi Chapel [phone]) on 12/01/2019 6:26:28 PM   Radiology DG Chest Port 1 View  Result Date: 12/01/2019 CLINICAL DATA:  Lethargy EXAM: PORTABLE CHEST 1 VIEW COMPARISON:  February 19, 2019 FINDINGS: There are hazy bilateral airspace opacities, greatest at the lung bases. There are slightly prominent interstitial lung markings. There appear to be small bilateral pleural effusions, right greater than left. The heart size is enlarged. There is no pneumothorax. IMPRESSION: Overall findings concerning for congestive heart failure. A superimposed atypical infectious process is difficult to exclude. Electronically Signed   By: Constance Holster M.D.   On: 12/01/2019 20:59    Procedures .Critical Care Performed by: Noemi Chapel, MD Authorized by: Noemi Chapel, MD   Critical care provider statement:    Critical care time (minutes):  35   Critical care time was exclusive of:  Separately billable procedures and treating other patients and teaching time   Critical care was necessary to treat or prevent imminent or life-threatening deterioration of the following conditions:  Respiratory failure and cardiac failure   Critical care was time spent personally by me on the following  activities:  Blood draw for specimens, development of treatment plan with patient or surrogate, discussions with consultants, evaluation of patient's response to treatment, examination of patient, obtaining history from patient or surrogate, ordering and performing treatments and interventions, ordering and review of laboratory studies, ordering and review of radiographic studies, pulse oximetry, re-evaluation of patient's condition and review of old charts   (including critical care time)  Medications Ordered in ED Medications  furosemide (LASIX) injection 40 mg (has no administration in time range)  albuterol (PROVENTIL,VENTOLIN) solution continuous neb (has no administration in time range)  albuterol (VENTOLIN HFA) 108 (90 Base) MCG/ACT inhaler 4 puff (4 puffs Inhalation Given 12/01/19 1854)  methylPREDNISolone sodium succinate (SOLU-MEDROL) 125 mg/2 mL injection 125 mg (125 mg Intravenous Given 12/01/19 1854)    ED Course  I have reviewed the triage vital signs and the nursing notes.  Pertinent labs & imaging results that were available during my care of the patient were reviewed by me and considered in my medical decision making (see chart for details).    MDM Rules/Calculators/A&P                      This patient is critically ill and appears to be in respiratory distress.  I would not be surprised if she was hypercapnic, she will need albuterol treatments, she does report to me that she has had Covid testing but it is hard to tell when this occurred and what the results were, she nods when I ask if she was negative.  She will need a chest x-ray, cardiac monitoring, she will need labs and likely inpatient treatment.   6:29 PM Cardiac monitoring reveals normal sinus rhythm, rate of approximately 85 (Rate & rhythm), as reviewed and interpreted by me. Cardiac monitoring was ordered due to respiratory distress, increased work of breathing, history of cardiac disease and to monitor patient  for dysrhythmia.  This patient continues to be ill-appearing with  ongoing difficulty breathing.  Thankfully she is Covid negative so we will start BiPAP.  Her labs show that she is in acute renal failure.  She has no leukocytosis however her chest x-ray according to my interpretation and I do agree with the radiologist that there is bilateral pulmonary edema.  This is much less likely to be infiltrate given the patient's lack of an infectious appearance.  Her ABG shows no hypercapnia suggesting that this is more of hypoxic respiratory failure.  The patient is critically ill.  She was tested for Covid and is negative.  I will page the hospitalist for admission.  They returned the call at approximately 9:40 PM and are agreeable to the admission  [name] was evaluated in Emergency Department on 12/01/2019 for the symptoms described in the history of present illness. She was evaluated in the context of the global COVID-19 pandemic, which necessitated consideration that the patient might be at risk for infection with the SARS-CoV-2 virus that causes COVID-19. Institutional protocols and algorithms that pertain to the evaluation of patients at risk for COVID-19 are in a state of rapid change based on information released by regulatory bodies including the CDC and federal and state organizations. These policies and algorithms were followed during the patient's care in the ED.   Final Clinical Impression(s) / ED Diagnoses Final diagnoses:  Acute congestive heart failure, unspecified heart failure type ([place])  Altered mental status, unspecified altered mental status type  Acute respiratory failure with hypoxia ([place])  Acute renal failure, unspecified acute renal failure type ([name])      Noemi Chapel, MD 12/01/19 2111    Noemi Chapel, MD 12/01/19 2142    Noemi Chapel, MD 12/01/19 [phone]

## 2019-12-01 NOTE — H&P (Signed)
History and Physical    [name]:[id number] DOB: [birth year] DOA: 12/01/2019  PCP: Tsosie Billing, MD (Inactive) Patient coming from: Home  Chief Complaint: Shortness of breath  HPI: [name] is a 59 y.o. female with medical history significant of alcoholic liver cirrhosis, CAD, carotid artery disease, chronic combined systolic and diastolic congestive heart failure, COPD, PAD, hypertension, hyperlipidemia, hypothyroidism, paroxysmal atrial fibrillation, type 2 diabetes, OSA not on CPAP, cocaine use/polysubstance abuse presenting to the ED via EMS for evaluation of shortness of breath.  Family called EMS because patient has been getting more lethargic over the last several days and having labored breathing. Patient reports 3-day history of dyspnea and orthopnea.  She is coughing up clear sputum.  Her legs are swollen.  States she takes her home medications but is not able to tell me which medication she takes.  States she uses 1 inhaler for her COPD.  Denies chest pain.  Denies fevers, chills, nausea, vomiting, abdominal pain, or diarrhea.  No other complaints.  ED Course: Afebrile and hemodynamically stable.  Not hypoxic.  Patient appeared to be in respiratory distress on arrival to the ED.  She was wheezing.  Labs showing no leukocytosis.  Hemoglobin 8.5, at baseline.  BNP elevated at 938.  Creatinine 1.8, baseline 1.2-1.3.  AST 60, ALT 83, alk phos 438, T bili 0.7.  High-sensitivity troponin 22.  It is reported in the chart that initial ABG showing pH 7.45, PCO2 37, and PO2 37.  I spoke to respiratory and they confirm that this is an error, this was a VBG.  Chest x-ray showing hazy bilateral airspace opacities and small bilateral pleural effusions concerning for volume overload.  SARS-CoV-2 PCR test negative. Received albuterol continuous neb treatment, IV Solu-Medrol 125 mg, and IV Lasix 40 mg.  Review of Systems:  All systems reviewed and apart from history of presenting  illness, are negative.  Past Medical History:  Diagnosis Date  . Alcohol abuse   . Alcoholic cirrhosis ([place])   . Anemia   . Anxiety   . Arthritis    "knees" (11/26/2018)  . B12 deficiency   . CAD (coronary artery disease)    a. cath 11/10/2014 LM nl, LAD min irregs, D1 30 ost, D2 50d, LCX 39m OM1 80 p/m (1.5 mm vessel), OM2 418mRCA nondom 9036mmed rx.. Demand ischemia in the setting of rapid a-fib.  . Cardiomyopathy ([facility] [place] . Carotid artery disease ([facility]CC[place]  a. 01/2015 Carotid Angio: RICA [id number]/p L carotid endarterectomy 02/2015.  . Cellulitis    lower extremities  . Chronic combined systolic and diastolic CHF (congestive heart failure) (HCC[place]  a. Last echo 12/205: EF 40-45%, inferoapical/posterior HK, not technically sufficient to allow eval of LV diastolic function, normal LA in size..  . Cocaine abuse ([facility]CC[place] . COPD (chronic obstructive pulmonary disease) ([facility]CC[place] . Critical lower limb ischemia   . Depression   . Diabetic peripheral neuropathy ([facility]CC[place] . Elevated troponin    a. Chronic elevation.  . GMarland KitchenRD (gastroesophageal reflux disease)   . Hyperlipemia   . Hypertension   . Hypokalemia   . Hypomagnesemia   . Hypothyroidism   . Marijuana abuse   . Narcotic abuse ([facility]CC[place] . Noncompliance   . NSVT (nonsustained ventricular tachycardia) ([facility]CC[place] . Obesity   . PAF (paroxysmal atrial fibrillation) (HCC[place] . Paroxysmal atrial tachycardia (HCCCom[place] . Peripheral arterial disease (HCC[place]la[place]  a. 01/2015 Angio/PTA: LSFA 100 w/ recon @ adductor canal and 1 vessel runoff via AT, RSFA 99 (atherectomy/pta) - 1 vessel runoff via diff dzs peroneal.  . Pneumonia    "once or twice" (11/26/2018)  . Poorly controlled type 2 diabetes mellitus ([place])   . Renal insufficiency    a. 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  [name] Sleep apnea    "couldn't handle wearing the mask" (11/26/2018)  . Symptomatic bradycardia    avoid AV blocking agent per EP  . Tobacco abuse     Past Surgical History:  Procedure  Laterality Date  . ABDOMINAL AORTOGRAM N/A 06/26/2019   Procedure: ABDOMINAL AORTOGRAM;  Surgeon: Wellington Hampshire, MD;  Location: [facility] [facility];  Service: Cardiovascular;  Laterality: N/A;  . AMPUTATION Right 06/14/2017   Procedure: Right foot transmetatarsal amputation;  Surgeon: Newt Minion, MD;  Location: [place];  Service: Orthopedics;  Laterality: Right;  . AMPUTATION Left 06/28/2019   Procedure: AMPUTATION LEFT FIFTH TOE;  Surgeon: Rosetta Posner, MD;  Location: [place];  Service: Vascular;  Laterality: Left;  . AMPUTATION TOE Right 04/28/2017   Procedure: AMPUTATION OF RIGHT SECOND RAY;  Surgeon: Newt Minion, MD;  Location: [place];  Service: Orthopedics;  Laterality: Right;  . CARDIAC CATHETERIZATION    . CARDIAC CATHETERIZATION N/A 01/12/2016   Procedure: Temporary Wire;  Surgeon: Minus Breeding, MD;  Location: [place] [facility];  Service: Cardiovascular;  Laterality: N/A;  . CARDIOVERSION  ~ 02/2013   "twice"   . CAROTID ANGIOGRAM N/A 01/15/2015   Procedure: CAROTID ANGIOGRAM;  Surgeon: [name], MD;  Location: [facility];  Service: Cardiovascular;  Laterality: N/A;  . DILATION AND CURETTAGE OF UTERUS  1988  . ENDARTERECTOMY Left 02/19/2015   Procedure: LEFT CAROTID ENDARTERECTOMY ;  Surgeon: Serafina Mitchell, MD;  Location: [place];  Service: Vascular;  Laterality: Left;  . FEMORAL-TIBIAL BYPASS GRAFT Left 06/28/2019   Procedure: BYPASS GRAFT LEFT LEG FEMORAL TO ANTERIOR TIBIAL ARTERY using LEFT GREATER SAPHENOUS VEIN;  Surgeon: Rosetta Posner, MD;  Location: [place];  Service: Vascular;  Laterality: Left;  . LEFT HEART CATHETERIZATION WITH CORONARY ANGIOGRAM N/A 10/31/2014   Procedure: LEFT HEART CATHETERIZATION WITH CORONARY ANGIOGRAM;  Surgeon: Burnell Blanks, MD;  Location: [facility] CATH LAB;  Service: Cardiovascular;  Laterality: N/A;  . LOWER EXTREMITY ANGIOGRAM N/A 09/10/2013   Procedure: LOWER EXTREMITY ANGIOGRAM;  Surgeon: [name], MD;  Location[facility];   Service: Cardiovascular;  Laterality: N/A;  . LOWER EXTREMITY ANGIOGRAM N/A 01/15/2015   Procedure: LOWER EXTREMITY ANGIOGRAM;  Surgeon: [name], MD;  Location: [facility] CATH LAB;  Service: Cardiovascular;  Laterality: N/A;  . LOWER EXTREMITY ANGIOGRAPHY N/A 04/13/2017   Procedure: Lower Extremity Angiography;  Surgeon: [name], MD;  Location: [place] [facility];  Service: Cardiovascular;  Laterality: N/A;  . LOWER EXTREMITY ANGIOGRAPHY Left 06/26/2019   Procedure: LOWER EXTREMITY ANGIOGRAPHY;  Surgeon: Wellington Hampshire, MD;  Location: [place] [facility];  Service: Cardiovascular;  Laterality: Left;  . PERIPHERAL VASCULAR BALLOON ANGIOPLASTY Left 06/26/2019   Procedure: PERIPHERAL VASCULAR BALLOON ANGIOPLASTY;  Surgeon: Wellington Hampshire, MD;  Location: [place] [facility];  Service: Cardiovascular;  Laterality: Left;  unable to cross lt sfa occlusion  . PERIPHERAL VASCULAR INTERVENTION  04/13/2017   Procedure: Peripheral Vascular Intervention;  Surgeon: [name], MD;  Location: [place] [facility];  Service: Cardiovascular;;  . VEIN HARVEST Left 06/28/2019   Procedure:  LEFT LEG GREATER SAPHENOUS VEIN HARVEST;  Surgeon: Rosetta Posner, MD;  Location: [place];  Service: Vascular;  Laterality: Left;     reports that she has been smoking e-cigarettes and cigarettes. She has a 44.00 pack-year smoking history. She has quit using smokeless tobacco.  Her smokeless tobacco use included snuff. She reports current alcohol use. She reports current drug use. Drugs: "Crack" cocaine, Marijuana, and Oxycodone.  Allergies  Allergen Reactions  . Gabapentin Nausea And Vomiting and Other (See Comments)    POSSIBLE SHAKING? TAKING MED CURRENTLY  . Lyrica [Pregabalin] Other (See Comments)    Shaking Pt still taking lyrica--"probably will stop taking"     Family History  Problem Relation Age of Onset  . Hypertension Mother   . Diabetes Mother   . Cancer Mother        breast, ovarian, colon  .  Clotting disorder Mother   . Heart disease Mother   . Heart attack Mother   . Breast cancer Mother        in 32's  . Hypertension Father   . Heart disease Father   . Emphysema Sister        smoked    Prior to Admission medications   Medication Sig Start Date End Date Taking? Authorizing Provider  acetaminophen (TYLENOL) 500 MG tablet Take 2 tablets (1,000 mg total) by mouth every 6 (six) hours as needed for mild pain. 07/08/19   Desiree Hane, MD  albuterol (VENTOLIN HFA) 108 (90 Base) MCG/ACT inhaler Inhale 2 puffs into the lungs every 6 (six) hours as needed for wheezing or shortness of breath. 07/08/19   Oretha Milch D, MD  amiodarone (PACERONE) 100 MG tablet Take 1 tablet (100 mg total) by mouth daily. 10/02/19   Bensimhon, Shaune Pascal, MD  amLODipine (NORVASC) 10 MG tablet TAKE 1 TABLET BY MOUTH DAILY.(AM) 11/28/19   Clegg, [name] D, NP  apixaban (ELIQUIS) 5 MG TABS tablet Take 1 tablet (5 mg total) by mouth 2 (two) times daily. 07/08/19   Oretha Milch D, MD  atorvastatin (LIPITOR) 40 MG tablet TAKE 1 TABLET BY MOUTH ONCE EVERY EVENING 07/08/19   Oretha Milch D, MD  bisoprolol (ZEBETA) 5 MG tablet TAKE 1 TABLET (5 MG TOTAL) BY MOUTH DAILY. (AM) 11/28/19   Larey Dresser, MD  cholecalciferol (VITAMIN D) 1000 units tablet Take 1 tablet (1,000 Units total) by mouth daily. 07/08/19   Oretha Milch D, MD  clopidogrel (PLAVIX) 75 MG tablet Take 1 tablet (75 mg total) by mouth daily. 07/08/19   Oretha Milch D, MD  colchicine 0.6 MG tablet TAKE 2 TABLETS (1.2 MG) BY MOUTH INITIALLY, THEN TAKE 1 TAB (0.6 MG ) IN ONE HOUR 09/23/19   [provider]  docusate sodium (COLACE) 100 MG capsule Take 1 capsule (100 mg total) by mouth daily. 07/09/19   Desiree Hane, MD  folic acid (FOLVITE) 1 MG tablet Take 1 tablet (1 mg total) by mouth daily. 07/08/19   Desiree Hane, MD  Insulin Glargine (LANTUS SOLOSTAR) 100 UNIT/ML Solostar Pen Inject 30 Units into the skin at bedtime. 07/08/19   Oretha Milch D, MD  Insulin Lispro (HUMALOG KWIKPEN) 200 UNIT/ML SOPN Inject 8 Units into the skin 3 (three) times daily with meals. 07/08/19   Desiree Hane, MD  levothyroxine (SYNTHROID, LEVOTHROID) 50 MCG tablet Take 1 tablet (50 mcg total) by mouth daily. 08/09/17   Medina-Vargas, Monina C, NP  Multiple Vitamin (MULTIVITAMIN WITH MINERALS) TABS tablet  Take 1 tablet by mouth daily. 10/25/18   Kayleen Memos, DO  nortriptyline (PAMELOR) 10 MG capsule Take 20 mg by mouth at bedtime.    [provider]  pantoprazole (PROTONIX) 40 MG tablet Take 1 tablet (40 mg total) by mouth daily. 07/09/19   Desiree Hane, MD  senna-docusate (SENOKOT-S) 8.6-50 MG tablet Take 2 tablets by mouth at bedtime as needed for mild constipation. 07/08/19   Oretha Milch D, MD  thiamine 100 MG tablet Take 1 tablet (100 mg total) by mouth daily. 07/08/19   Oretha Milch D, MD  torsemide (DEMADEX) 20 MG tablet TAKE 2 TABLETS BY MOUTH 2 (TWO) TIMES DAILY. 11/28/19   Larey Dresser, MD    Physical Exam: Vitals:   12/01/19 1851 12/01/19 1905 12/01/19 2039 12/01/19 2042  BP:   108/71   Pulse:    78  Resp:    16  Temp: 98.8 F (37.1 C)     TempSrc: Oral     SpO2:    99%  Height:  (P) '5\' 4"'  (1.626 m)      Physical Exam  Constitutional: She is oriented to person, place, and time.  HENT:  Head: Normocephalic.  Eyes: Right eye exhibits no discharge. Left eye exhibits no discharge.  Neck: JVD present.  Cardiovascular: Normal rate, regular rhythm and intact distal pulses.  Pulmonary/Chest: Effort normal. She has wheezes. She has rales.  Abdominal: Soft. Bowel sounds are normal. She exhibits no distension. There is no abdominal tenderness. There is no guarding.  Musculoskeletal:        General: Edema present.     Cervical back: Neck supple.     Comments: +3 pitting edema chronic venous stasis dermatitis of lower extremities  Neurological: She is alert and oriented to person, place, and time.  Skin: Skin is  warm and dry. She is not diaphoretic.     Labs on Admission: I have personally reviewed following labs and imaging studies  CBC: Recent Labs  Lab 12/01/19 1837 12/01/19 1856 12/01/19 2225  WBC 8.0  --   --   NEUTROABS 6.0  --   --   HGB 8.5* 7.8* 9.5*  HCT 28.4* 23.0* 28.0*  MCV 94.7  --   --   PLT 299  --   --    Basic Metabolic Panel: Recent Labs  Lab 12/01/19 1837 12/01/19 1856 12/01/19 2225  NA 142 142 144  K 4.4 4.2 4.0  CL 108  --   --   CO2 23  --   --   GLUCOSE 103*  --   --   BUN 43*  --   --   CREATININE 1.85*  --   --   CALCIUM 8.3*  --   --    GFR: CrCl cannot be calculated (Unknown ideal weight.). Liver Function Tests: Recent Labs  Lab 12/01/19 1837  AST 60*  ALT 83*  ALKPHOS 438*  BILITOT 0.7  PROT 6.7  ALBUMIN 2.7*   No results for input(s): LIPASE, AMYLASE in the last 168 hours. No results for input(s): AMMONIA in the last 168 hours. Coagulation Profile: No results for input(s): INR, PROTIME in the last 168 hours. Cardiac Enzymes: No results for input(s): CKTOTAL, CKMB, CKMBINDEX, TROPONINI in the last 168 hours. BNP (last 3 results) No results for input(s): PROBNP in the last 8760 hours. HbA1C: No results for input(s): HGBA1C in the last 72 hours. CBG: No results for input(s): GLUCAP in the last 168 hours. Lipid  Profile: No results for input(s): CHOL, HDL, LDLCALC, TRIG, CHOLHDL, LDLDIRECT in the last 72 hours. Thyroid Function Tests: No results for input(s): TSH, T4TOTAL, FREET4, T3FREE, THYROIDAB in the last 72 hours. Anemia Panel: No results for input(s): VITAMINB12, FOLATE, FERRITIN, TIBC, IRON, RETICCTPCT in the last 72 hours. Urine analysis:    Component Value Date/Time   COLORURINE YELLOW 03/18/2019 1540   APPEARANCEUR CLEAR 03/18/2019 1540   LABSPEC 1.013 03/18/2019 1540   PHURINE 5.0 03/18/2019 1540   GLUCOSEU NEGATIVE 03/18/2019 1540   HGBUR NEGATIVE 03/18/2019 [place] 03/18/2019 1540   KETONESUR  NEGATIVE 03/18/2019 1540   PROTEINUR NEGATIVE 03/18/2019 1540   UROBILINOGEN 0.2 09/05/2015 0012   NITRITE NEGATIVE 03/18/2019 1540   LEUKOCYTESUR NEGATIVE 03/18/2019 1540    Radiological Exams on Admission: DG Chest Port 1 View  Result Date: 12/01/2019 CLINICAL DATA:  Lethargy EXAM: PORTABLE CHEST 1 VIEW COMPARISON:  February 19, 2019 FINDINGS: There are hazy bilateral airspace opacities, greatest at the lung bases. There are slightly prominent interstitial lung markings. There appear to be small bilateral pleural effusions, right greater than left. The heart size is enlarged. There is no pneumothorax. IMPRESSION: Overall findings concerning for congestive heart failure. A superimposed atypical infectious process is difficult to exclude. Electronically Signed   By: Constance Holster M.D.   On: 12/01/2019 20:59    EKG: Independently reviewed.  Sinus rhythm, no significant change since prior tracing.  Assessment/Plan Principal Problem:   Acute exacerbation of CHF (congestive heart failure) ([name]) Active Problems:   DM (diabetes mellitus), type 2 with peripheral vascular complications ([name])   COPD with acute exacerbation ([name])   AKI (acute Price injury) ([place])   Acute respiratory failure with hypoxia ([name])   Acute hypoxic respiratory failure secondary to acute exacerbation of chronic combined systolic and diastolic congestive heart failure and acute exacerbation of chronic obstructive pulmonary disease Currently wheezing on exam.  Slightly increased work of breathing but not hypoxic. ABG on room air with pH 7.41, PCO2 38, and PO2 67.  Appears volume overloaded on exam.  BNP significantly elevated.  Chest x-ray with signs of volume overload.  Last echo done December 2019 with LVEF 45 to 50% and grade 2 diastolic dysfunction.  No fever or leukocytosis.  SARS-CoV-2 PCR test negative. -IV Lasix 40 mg twice daily -Monitor intake and output, daily weights, low-sodium diet with fluid  restriction -Repeat echocardiogram -DuoNebs every 6 hours -Albuterol nebulizer as needed -Pulmicort nebulizer twice daily -Solu-Medrol 60 mg every 6 hours -Will hold off antibiotics given no fever or leukocytosis.  Check procalcitonin level. -Mucinex DM -Flutter valve -Continuous pulse ox, supplemental oxygen  AKI on CKD 3 Creatinine 1.8, baseline 1.2-1.3.  Suspect cardiorenal due to acutely decompensated CHF. -IV Lasix as above -Monitor renal function and urine output  CAD Troponin mildly elevated, suspect related to demand ischemia from acutely decompensated CHF.  High-sensitivity troponin 22.  EKG not suggestive of ACS.  Patient denies chest pain. -Cardiac monitoring -Trend troponin  Insulin-dependent diabetes mellitus -Check A1c.  Sliding scale insulin sensitive ACHS and CBG checks.  Hypertension Currently normotensive.  Paroxysmal A. Fib Currently in sinus rhythm.  Takes Eliquis at home.  Tobacco use -NicoDerm patch -Counseling  Alcohol use -CIWA monitoring; Ativan as needed -Thiamine, folate, multivitamin  Pharmacy med rec pending.  DVT prophylaxis: On chronic anticoagulation with Eliquis. Code Status: Patient wants to be DNR/DNI.   Family Communication: No family available at this time. Disposition Plan: Anticipate discharge after clinical improvement. Consults  called: None Admission status: It is my clinical opinion that admission to INPATIENT is reasonable and necessary in this 59 y.o. female . presenting with acute hypoxic respiratory failure secondary to acute CHF and COPD exacerbation.  High risk of decompensation.  Given the aforementioned, the predictability of an adverse outcome is felt to be significant. I expect that the patient will require at least 2 midnights in the hospital to treat this condition.   The medical decision making on this patient was of high complexity and the patient is at high risk for clinical deterioration, therefore this is a  level 3 visit.  Shela Leff MD [facility]  If 7PM-7AM, please contact night-coverage [email or web address] Password [facility]  12/02/2019, 12:27 AM

## 2019-12-01 NOTE — ED Notes (Signed)
Saturated adult diaper removed, pt cleaned and purwick placed, pt intermittently falling asleep during care, pt does answer questions appropriately when ask.

## 2019-12-01 NOTE — ED Notes (Signed)
Pt requesting coffee and food at this time.

## 2019-12-01 NOTE — ED Notes (Signed)
Pt once again removed her own IV and removed her 02 and monitor, pt states she is ready to go home and eat. Xray at doorway for [facility]

## 2019-12-02 ENCOUNTER — Inpatient Hospital Stay (HOSPITAL_COMMUNITY): Payer: Medicare Other

## 2019-12-02 ENCOUNTER — Encounter (HOSPITAL_COMMUNITY): Payer: Self-pay | Admitting: Internal Medicine

## 2019-12-02 ENCOUNTER — Other Ambulatory Visit: Payer: Self-pay

## 2019-12-02 DIAGNOSIS — J9601 Acute respiratory failure with hypoxia: Secondary | ICD-10-CM | POA: Diagnosis present

## 2019-12-02 DIAGNOSIS — I5042 Chronic combined systolic (congestive) and diastolic (congestive) heart failure: Secondary | ICD-10-CM

## 2019-12-02 HISTORY — DX: Acute respiratory failure with hypoxia: J96.01

## 2019-12-02 LAB — COMPREHENSIVE METABOLIC PANEL
ALT: 83 U/L — ABNORMAL HIGH (ref 0–44)
AST: 54 U/L — ABNORMAL HIGH (ref 15–41)
Albumin: 2.8 g/dL — ABNORMAL LOW (ref 3.5–5.0)
Alkaline Phosphatase: 434 U/L — ABNORMAL HIGH (ref 38–126)
Anion gap: 15 (ref 5–15)
BUN: 44 mg/dL — ABNORMAL HIGH (ref 6–20)
CO2: 21 mmol/L — ABNORMAL LOW (ref 22–32)
Calcium: 8.4 mg/dL — ABNORMAL LOW (ref 8.9–10.3)
Chloride: 107 mmol/L (ref 98–111)
Creatinine, Ser: 1.87 mg/dL — ABNORMAL HIGH (ref 0.44–1.00)
GFR calc Af Amer: 34 mL/min — ABNORMAL LOW (ref >60)
GFR calc non Af Amer: 29 mL/min — ABNORMAL LOW (ref >60)
Glucose, Bld: 203 mg/dL — ABNORMAL HIGH (ref 70–99)
Potassium: 4.4 mmol/L (ref 3.5–5.1)
Sodium: 143 mmol/L (ref 135–145)
Total Bilirubin: 0.7 mg/dL (ref 0.3–1.2)
Total Protein: 6.8 g/dL (ref 6.5–8.1)

## 2019-12-02 LAB — GLUCOSE, CAPILLARY
Glucose-Capillary: 241 mg/dL — ABNORMAL HIGH (ref 70–99)
Glucose-Capillary: 238 mg/dL — ABNORMAL HIGH (ref 70–99)
Glucose-Capillary: 327 mg/dL — ABNORMAL HIGH (ref 70–99)
Glucose-Capillary: 337 mg/dL — ABNORMAL HIGH (ref 70–99)
Glucose-Capillary: 286 mg/dL — ABNORMAL HIGH (ref 70–99)

## 2019-12-02 LAB — URINALYSIS, ROUTINE W REFLEX MICROSCOPIC
Bilirubin Urine: NEGATIVE
Glucose, UA: NEGATIVE mg/dL
Ketones, ur: NEGATIVE mg/dL
Nitrite: NEGATIVE
Protein, ur: 100 mg/dL — AB
Specific Gravity, Urine: 1.013 (ref 1.005–1.030)
pH: 5 (ref 5.0–8.0)

## 2019-12-02 LAB — ECHOCARDIOGRAM COMPLETE
Height: 64 in
Weight: 3661.4 oz

## 2019-12-02 LAB — HEMOGLOBIN A1C
Hgb A1c MFr Bld: 7.3 % — ABNORMAL HIGH (ref 4.8–5.6)
Mean Plasma Glucose: 162.81 mg/dL

## 2019-12-02 LAB — HIV ANTIBODY (ROUTINE TESTING W REFLEX): HIV Screen 4th Generation wRfx: NONREACTIVE

## 2019-12-02 LAB — PROCALCITONIN: Procalcitonin: <0.1 ng/mL

## 2019-12-02 LAB — TROPONIN I (HIGH SENSITIVITY): Troponin I (High Sensitivity): 18 ng/L — ABNORMAL HIGH (ref <18)

## 2019-12-02 LAB — MRSA PCR SCREENING: MRSA by PCR: NEGATIVE

## 2019-12-02 MED ORDER — IPRATROPIUM-ALBUTEROL 0.5-2.5 (3) MG/3ML IN SOLN: 3.0000 mL | Freq: Four times a day (QID) | RESPIRATORY_TRACT | Status: DC | PRN

## 2019-12-02 MED ORDER — SODIUM CHLORIDE 0.9% FLUSH
3.0000 mL | Freq: Two times a day (BID) | INTRAVENOUS | Status: DC
  Administered 2019-12-02 – 2019-12-09 (×14): 3 mL via INTRAVENOUS

## 2019-12-02 MED ORDER — LORAZEPAM 1 MG PO TABS
1.0000 mg | ORAL_TABLET | ORAL | Status: DC | PRN
  Administered 2019-12-02: 1 mg via ORAL
  Filled 2019-12-02: qty 4

## 2019-12-02 MED ORDER — THIAMINE HCL 100 MG PO TABS
100.0000 mg | ORAL_TABLET | Freq: Every day | ORAL | Status: DC
  Administered 2019-12-02 – 2019-12-09 (×8): 100 mg via ORAL
  Filled 2019-12-02 (×8): qty 1

## 2019-12-02 MED ORDER — APIXABAN 5 MG PO TABS
5.0000 mg | ORAL_TABLET | Freq: Two times a day (BID) | ORAL | Status: DC
  Administered 2019-12-02 – 2019-12-09 (×15): 5 mg via ORAL
  Filled 2019-12-02 (×14): qty 1

## 2019-12-02 MED ORDER — BUDESONIDE 0.25 MG/2ML IN SUSP
0.2500 mg | Freq: Two times a day (BID) | RESPIRATORY_TRACT | Status: DC
  Administered 2019-12-02 – 2019-12-04 (×5): 0.25 mg via RESPIRATORY_TRACT
  Filled 2019-12-02 (×8): qty 2

## 2019-12-02 MED ORDER — SODIUM CHLORIDE 0.9 % IV SOLN
250.0000 mL | INTRAVENOUS | Status: DC | PRN
  Administered 2019-12-06: 250 mL via INTRAVENOUS

## 2019-12-02 MED ORDER — ATORVASTATIN CALCIUM 40 MG PO TABS
40.0000 mg | ORAL_TABLET | Freq: Every day | ORAL | Status: DC
  Administered 2019-12-02 – 2019-12-08 (×7): 40 mg via ORAL
  Filled 2019-12-02 (×7): qty 1

## 2019-12-02 MED ORDER — LORAZEPAM 2 MG/ML IJ SOLN: 1.0000 mg | INTRAMUSCULAR | Status: DC | PRN

## 2019-12-02 MED ORDER — NICOTINE 21 MG/24HR TD PT24
21.0000 mg | MEDICATED_PATCH | Freq: Every day | TRANSDERMAL | Status: DC
  Administered 2019-12-02 – 2019-12-09 (×8): 21 mg via TRANSDERMAL
  Filled 2019-12-02 (×8): qty 1

## 2019-12-02 MED ORDER — FOLIC ACID 1 MG PO TABS
1.0000 mg | ORAL_TABLET | Freq: Every day | ORAL | Status: DC
  Administered 2019-12-02 – 2019-12-09 (×8): 1 mg via ORAL
  Filled 2019-12-02 (×8): qty 1

## 2019-12-02 MED ORDER — INSULIN ASPART 100 UNIT/ML ~~LOC~~ SOLN
0.0000 [IU] | Freq: Every day | SUBCUTANEOUS | Status: DC
  Administered 2019-12-02: 2 [IU] via SUBCUTANEOUS
  Administered 2019-12-02: 3 [IU] via SUBCUTANEOUS
  Administered 2019-12-03 – 2019-12-04 (×2): 5 [IU] via SUBCUTANEOUS
  Administered 2019-12-06 – 2019-12-07 (×2): 2 [IU] via SUBCUTANEOUS

## 2019-12-02 MED ORDER — INSULIN ASPART 100 UNIT/ML ~~LOC~~ SOLN
0.0000 [IU] | Freq: Three times a day (TID) | SUBCUTANEOUS | Status: DC
  Administered 2019-12-02 (×2): 7 [IU] via SUBCUTANEOUS
  Administered 2019-12-02: 3 [IU] via SUBCUTANEOUS
  Administered 2019-12-03: 7 [IU] via SUBCUTANEOUS
  Administered 2019-12-03: 9 [IU] via SUBCUTANEOUS
  Administered 2019-12-03: 7 [IU] via SUBCUTANEOUS

## 2019-12-02 MED ORDER — IPRATROPIUM-ALBUTEROL 0.5-2.5 (3) MG/3ML IN SOLN
3.0000 mL | Freq: Four times a day (QID) | RESPIRATORY_TRACT | Status: DC
  Administered 2019-12-02: 3 mL via RESPIRATORY_TRACT
  Filled 2019-12-02 (×2): qty 3

## 2019-12-02 MED ORDER — INSULIN GLARGINE 100 UNIT/ML ~~LOC~~ SOLN
30.0000 [IU] | Freq: Every day | SUBCUTANEOUS | Status: DC
  Administered 2019-12-02 – 2019-12-03 (×2): 30 [IU] via SUBCUTANEOUS
  Filled 2019-12-02 (×3): qty 0.3

## 2019-12-02 MED ORDER — THIAMINE HCL 100 MG/ML IJ SOLN: 100.0000 mg | Freq: Every day | INTRAMUSCULAR | Status: DC

## 2019-12-02 MED ORDER — ACETAMINOPHEN 325 MG PO TABS
650.0000 mg | ORAL_TABLET | ORAL | Status: DC | PRN
  Administered 2019-12-05 – 2019-12-08 (×4): 650 mg via ORAL
  Filled 2019-12-02 (×4): qty 2

## 2019-12-02 MED ORDER — PANTOPRAZOLE SODIUM 40 MG PO TBEC
40.0000 mg | DELAYED_RELEASE_TABLET | Freq: Every day | ORAL | Status: DC
  Administered 2019-12-02 – 2019-12-09 (×8): 40 mg via ORAL
  Filled 2019-12-02 (×7): qty 1

## 2019-12-02 MED ORDER — ADULT MULTIVITAMIN W/MINERALS CH
1.0000 | ORAL_TABLET | Freq: Every day | ORAL | Status: DC
  Administered 2019-12-02 – 2019-12-09 (×8): 1 via ORAL
  Filled 2019-12-02 (×8): qty 1

## 2019-12-02 MED ORDER — NORTRIPTYLINE HCL 10 MG PO CAPS
20.0000 mg | ORAL_CAPSULE | Freq: Every day | ORAL | Status: DC
  Administered 2019-12-02 – 2019-12-08 (×7): 20 mg via ORAL
  Filled 2019-12-02 (×8): qty 2

## 2019-12-02 MED ORDER — AMIODARONE HCL 100 MG PO TABS
100.0000 mg | ORAL_TABLET | Freq: Every day | ORAL | Status: DC
  Administered 2019-12-02 – 2019-12-09 (×8): 100 mg via ORAL
  Filled 2019-12-02 (×8): qty 1

## 2019-12-02 MED ORDER — DM-GUAIFENESIN ER 30-600 MG PO TB12
1.0000 | ORAL_TABLET | Freq: Two times a day (BID) | ORAL | Status: DC
  Administered 2019-12-02 – 2019-12-09 (×15): 1 via ORAL
  Filled 2019-12-02 (×15): qty 1

## 2019-12-02 MED ORDER — CLOPIDOGREL BISULFATE 75 MG PO TABS
75.0000 mg | ORAL_TABLET | Freq: Every day | ORAL | Status: DC
  Administered 2019-12-02 – 2019-12-09 (×8): 75 mg via ORAL
  Filled 2019-12-02 (×7): qty 1

## 2019-12-02 MED ORDER — FUROSEMIDE 10 MG/ML IJ SOLN
40.0000 mg | Freq: Two times a day (BID) | INTRAMUSCULAR | Status: DC
  Administered 2019-12-02 – 2019-12-05 (×8): 40 mg via INTRAVENOUS
  Filled 2019-12-02 (×9): qty 4

## 2019-12-02 MED ORDER — METHYLPREDNISOLONE SODIUM SUCC 125 MG IJ SOLR
60.0000 mg | Freq: Four times a day (QID) | INTRAMUSCULAR | Status: DC
  Administered 2019-12-02 – 2019-12-04 (×9): 60 mg via INTRAVENOUS
  Filled 2019-12-02 (×9): qty 2

## 2019-12-02 MED ORDER — LEVOTHYROXINE SODIUM 50 MCG PO TABS
50.0000 ug | ORAL_TABLET | Freq: Every day | ORAL | Status: DC
  Administered 2019-12-02 – 2019-12-09 (×8): 50 ug via ORAL
  Filled 2019-12-02 (×7): qty 1

## 2019-12-02 MED ORDER — ALBUTEROL SULFATE (2.5 MG/3ML) 0.083% IN NEBU: 2.5000 mg | INHALATION_SOLUTION | Freq: Four times a day (QID) | RESPIRATORY_TRACT | Status: DC | PRN

## 2019-12-02 MED ORDER — SODIUM CHLORIDE 0.9% FLUSH: 3.0000 mL | INTRAVENOUS | Status: DC | PRN

## 2019-12-02 NOTE — Progress Notes (Signed)
PROGRESS NOTE    [name]  [id number] DOB: 1961-09-30 DOA: 12/01/2019 PCP: Tsosie Billing, MD (Inactive)     Brief Narrative:  [name] is a 59 y.o. female with medical history significant of alcoholic liver cirrhosis, CAD, carotid artery disease, chronic combined systolic and diastolic congestive heart failure, COPD, PAD, hypertension, hyperlipidemia, hypothyroidism, paroxysmal atrial fibrillation, type 2 diabetes, OSA not on CPAP, cocaine use/polysubstance abuse presenting to the ED via EMS for evaluation of shortness of breath.  Family called EMS because patient has been getting more lethargic over the last several days and having labored breathing. Patient reports 3-day history of dyspnea and orthopnea.  She is coughing up clear sputum.  Her legs are swollen. Patient appeared to be in respiratory distress on arrival to the ED.  She was wheezing.  BNP elevated at 938.  Chest x-ray showing hazy bilateral airspace opacities and small bilateral pleural effusions concerning for volume overload.  SARS-CoV-2 PCR test negative. Received albuterol continuous neb treatment, IV Solu-Medrol 125 mg, and IV Lasix 40 mg.  Admitted for acute hypoxemic respiratory failure secondary to acute on chronic combined systolic and diastolic heart failure as well as acute exacerbation of COPD.  New events last 24 hours / Subjective: Patient remains on BiPAP this morning.  Denies any pain or worsening shortness of breath.  Appears to be fatigued.  Assessment & Plan:   Principal Problem:   Acute exacerbation of CHF (congestive heart failure) ([name]) Active Problems:   DM (diabetes mellitus), type 2 with peripheral vascular complications ([name])   COPD with acute exacerbation ([name])   AKI (acute kidney injury) ([place])   Acute respiratory failure with hypoxia ([name])   Acute hypoxemic respiratory failure secondary to acute on chronic combined systolic and diastolic heart failure, acute COPD  exacerbation -Respiratory viral panel including influenza and SARS-CoV-2 negative -Chest x-ray concerned with prominent interstitial lung markings, bilateral pleural effusion, concerning for congestive heart failure -BiPAP as needed  Acute on chronic combined systolic and diastolic heart failure -BNP 938.7 -Echocardiogram pending -Continue IV Lasix -Daily weights, strict I's and O's, fluid restriction diet  COPD exacerbation -Continue Solu-Medrol, breathing treatments  AKI on CKD stage IIIb -Baseline creatinine 1.2-1.4  -Remains elevated but stable on IV Lasix, continue to monitor closely  Elevated troponin -Likely secondary to demand ischemia due to above illnesses -Troponin trended downward 22 --> 18  Insulin-dependent DM -Hemoglobin A1c 7.3  -Lantus, Novolog sliding-scale insulin  Paroxysmal A fibrillation -Currently in normal sinus rhythm -Continue amiodarone  -Continue Eliquis  HLD -Continue lipitor   CAD -Continue plavix   Hypothyroidism -Continue synthroid   Alcohol abuse -CIWA  Tobacco abuse -Nicotine patch  Asymptomatic bacteriuria -Urine culture pending   DVT prophylaxis: Eliquis Code Status: DNR Family Communication: No family at bedside Disposition Plan: Pending clinical improvement, remains on BiPAP this morning   Consultants:   None  Procedures:   None  Antimicrobials:  Anti-infectives (From admission, onward)   None        Objective: Vitals:   12/02/19 0600 12/02/19 0700 12/02/19 0753 12/02/19 0947  BP:   132/67 (!) 148/70  Pulse:  79 80 81  Resp: 15 17 18 18   Temp:   97.6 F (36.4 C)   TempSrc:   Oral   SpO2:  97% 98% 96%  Weight:      Height:        Intake/Output Summary (Last 24 hours) at 12/02/2019 1037 Last data filed at 12/02/2019 1007 Gross per 24  hour  Intake 3 ml  Output 650 ml  Net -647 ml   Filed Weights   12/02/19 0334  Weight: 103.8 kg    Examination:  General exam: Appears calm and  comfortable  Respiratory system: Mildly expiratory wheezes, on BiPAP and appears comfortable  Cardiovascular system: S1 & S2 heard, RRR. No murmurs. +nonpitting pedal edema. Gastrointestinal system: Abdomen is nondistended, soft and nontender. Normal bowel sounds heard. Central nervous system: Alert. No focal neurological deficits.  Extremities: Symmetric in appearance  Skin: No rashes, lesions or ulcers on exposed skin  Psychiatry: Judgement and insight appear normal. Mood & affect appropriate.   Data Reviewed: I have personally reviewed following labs and imaging studies  CBC: Recent Labs  Lab 12/01/19 1837 12/01/19 1856 12/01/19 2225  WBC 8.0  --   --   NEUTROABS 6.0  --   --   HGB 8.5* 7.8* 9.5*  HCT 28.4* 23.0* 28.0*  MCV 94.7  --   --   PLT 299  --   --    Basic Metabolic Panel: Recent Labs  Lab 12/01/19 1837 12/01/19 1856 12/01/19 2225 12/02/19 0405  NA 142 142 144 143  K 4.4 4.2 4.0 4.4  CL 108  --   --  107  CO2 23  --   --  21*  GLUCOSE 103*  --   --  203*  BUN 43*  --   --  44*  CREATININE 1.85*  --   --  1.87*  CALCIUM 8.3*  --   --  8.4*   GFR: Estimated Creatinine Clearance: 38.9 mL/min (A) (by C-G formula based on SCr of 1.87 mg/dL (H)). Liver Function Tests: Recent Labs  Lab 12/01/19 1837 12/02/19 0405  AST 60* 54*  ALT 83* 83*  ALKPHOS 438* 434*  BILITOT 0.7 0.7  PROT 6.7 6.8  ALBUMIN 2.7* 2.8*   No results for input(s): LIPASE, AMYLASE in the last 168 hours. No results for input(s): AMMONIA in the last 168 hours. Coagulation Profile: No results for input(s): INR, PROTIME in the last 168 hours. Cardiac Enzymes: No results for input(s): CKTOTAL, CKMB, CKMBINDEX, TROPONINI in the last 168 hours. BNP (last 3 results) No results for input(s): PROBNP in the last 8760 hours. HbA1C: Recent Labs    12/02/19 0407  HGBA1C 7.3*   CBG: Recent Labs  Lab 12/02/19 0626 12/02/19 0939  GLUCAP 241* 238*   Lipid Profile: No results for  input(s): CHOL, HDL, LDLCALC, TRIG, CHOLHDL, LDLDIRECT in the last 72 hours. Thyroid Function Tests: No results for input(s): TSH, T4TOTAL, FREET4, T3FREE, THYROIDAB in the last 72 hours. Anemia Panel: No results for input(s): VITAMINB12, FOLATE, FERRITIN, TIBC, IRON, RETICCTPCT in the last 72 hours. Sepsis Labs: Recent Labs  Lab 12/02/19 0405  PROCALCITON <0.10    Recent Results (from the past 240 hour(s))  Respiratory Panel by RT PCR (Flu A&B, Covid) - Nasopharyngeal Swab     Status: None   Collection Time: 12/01/19  6:37 PM   Specimen: Nasopharyngeal Swab  Result Value Ref Range Status   SARS Coronavirus 2 by RT PCR NEGATIVE NEGATIVE Final    Comment: (NOTE) SARS-CoV-2 target nucleic acids are NOT DETECTED. The SARS-CoV-2 RNA is generally detectable in upper respiratoy specimens during the acute phase of infection. The lowest concentration of SARS-CoV-2 viral copies this assay can detect is 131 copies/mL. A negative result does not preclude SARS-Cov-2 infection and should not be used as the sole basis for treatment or other patient management  decisions. A negative result may occur with  improper specimen collection/handling, submission of specimen other than nasopharyngeal swab, presence of viral mutation(s) within the areas targeted by this assay, and inadequate number of viral copies (<131 copies/mL). A negative result must be combined with clinical observations, patient history, and epidemiological information. The expected result is Negative. Fact Sheet for Patients:  PinkCheek.be Fact Sheet for Healthcare Providers:  GravelBags.it This test is not yet ap proved or cleared by the [place] FDA and  has been authorized for detection and/or diagnosis of SARS-CoV-2 by FDA under an Emergency Use Authorization (EUA). This EUA will remain  in effect (meaning this test can be used) for the duration of the COVID-19  declaration under Section 564(b)(1) of the Act, 21 U.S.C. section 360bbb-3(b)(1), unless the authorization is terminated or revoked sooner.    Influenza A by PCR NEGATIVE NEGATIVE Final   Influenza B by PCR NEGATIVE NEGATIVE Final    Comment: (NOTE) The Xpert Xpress SARS-CoV-2/FLU/RSV assay is intended as an aid in  the diagnosis of influenza from Nasopharyngeal swab specimens and  should not be used as a sole basis for treatment. Nasal washings and  aspirates are unacceptable for Xpert Xpress SARS-CoV-2/FLU/RSV  testing. Fact Sheet for Patients: PinkCheek.be Fact Sheet for Healthcare Providers: GravelBags.it This test is not yet approved or cleared by the [place] FDA and  has been authorized for detection and/or diagnosis of SARS-CoV-2 by  FDA under an Emergency Use Authorization (EUA). This EUA will remain  in effect (meaning this test can be used) for the duration of the  Covid-19 declaration under Section 564(b)(1) of the Act, 21  U.S.C. section 360bbb-3(b)(1), unless the authorization is  terminated or revoked. Performed at [facility], [place] [address] [place], [place] [address]   MRSA PCR Screening     Status: None   Collection Time: 12/02/19  4:05 AM   Specimen: Nasal Mucosa; Nasopharyngeal  Result Value Ref Range Status   MRSA by PCR NEGATIVE NEGATIVE Final    Comment:        The GeneXpert MRSA Assay (FDA approved for NASAL specimens only), is one component of a comprehensive MRSA colonization surveillance program. It is not intended to diagnose MRSA infection nor to guide or monitor treatment for MRSA infections. Performed at [facility], [place] [address], [place], [place] [address]       Radiology Studies: DG Chest Port 1 View  Result Date: 12/01/2019 CLINICAL DATA:  Lethargy EXAM: PORTABLE CHEST 1 VIEW COMPARISON:  February 19, 2019 FINDINGS: There are hazy bilateral airspace opacities,  greatest at the lung bases. There are slightly prominent interstitial lung markings. There appear to be small bilateral pleural effusions, right greater than left. The heart size is enlarged. There is no pneumothorax. IMPRESSION: Overall findings concerning for congestive heart failure. A superimposed atypical infectious process is difficult to exclude. Electronically Signed   By: Constance Holster M.D.   On: 12/01/2019 20:59      Scheduled Meds: . budesonide (PULMICORT) nebulizer solution  0.25 mg Nebulization BID  . dextromethorphan-guaiFENesin  1 tablet Oral BID  . folic acid  1 mg Oral Daily  . furosemide  40 mg Intravenous BID  . insulin aspart  0-5 Units Subcutaneous QHS  . insulin aspart  0-9 Units Subcutaneous TID WC  . ipratropium-albuterol  3 mL Nebulization Q6H  . methylPREDNISolone (SOLU-MEDROL) injection  60 mg Intravenous Q6H  . multivitamin with minerals  1 tablet Oral Daily  .  nicotine  21 mg Transdermal Daily  . sodium chloride flush  3 mL Intravenous Q12H  . thiamine  100 mg Oral Daily   Or  . thiamine  100 mg Intravenous Daily   Continuous Infusions: . sodium chloride       LOS: 1 day      Time spent: 40 minutes   Dessa Phi, DO [facility] 12/02/2019, 10:37 AM   Available via Epic secure chat 7am-7pm After these hours, please refer to coverage provider listed on [email or web address]

## 2019-12-02 NOTE — Progress Notes (Signed)
  Echocardiogram 2D Echocardiogram has been performed.  [name] 12/02/2019, 8:47 AM

## 2019-12-02 NOTE — Progress Notes (Signed)
PT Cancellation Note  Patient Details Name: [name] MRN: [id number] DOB: 1961-08-08   Cancelled Treatment:    Reason Eval/Treat Not Completed: Medical issues which prohibited therapy Pt currently on Bipap and RN requesting to hold. Will follow up as pt medically appropriate and as schedule allows.   Leighton Ruff, PT, DPT  Acute Rehabilitation Services  Pager: [phone] Office: [phone]  Rudean Hitt 12/02/2019, 12:26 PM

## 2019-12-03 ENCOUNTER — Encounter (HOSPITAL_COMMUNITY): Payer: Medicare Other

## 2019-12-03 LAB — BASIC METABOLIC PANEL
Anion gap: 10 (ref 5–15)
BUN: 63 mg/dL — ABNORMAL HIGH (ref 6–20)
CO2: 26 mmol/L (ref 22–32)
Calcium: 8.5 mg/dL — ABNORMAL LOW (ref 8.9–10.3)
Chloride: 99 mmol/L (ref 98–111)
Creatinine, Ser: 1.75 mg/dL — ABNORMAL HIGH (ref 0.44–1.00)
GFR calc Af Amer: 37 mL/min — ABNORMAL LOW (ref >60)
GFR calc non Af Amer: 32 mL/min — ABNORMAL LOW (ref >60)
Glucose, Bld: 342 mg/dL — ABNORMAL HIGH (ref 70–99)
Potassium: 4.8 mmol/L (ref 3.5–5.1)
Sodium: 135 mmol/L (ref 135–145)

## 2019-12-03 LAB — HEPATIC FUNCTION PANEL
ALT: 66 U/L — ABNORMAL HIGH (ref 0–44)
AST: 29 U/L (ref 15–41)
Albumin: 2.8 g/dL — ABNORMAL LOW (ref 3.5–5.0)
Alkaline Phosphatase: 439 U/L — ABNORMAL HIGH (ref 38–126)
Bilirubin, Direct: <0.1 mg/dL (ref 0.0–0.2)
Total Bilirubin: 0.5 mg/dL (ref 0.3–1.2)
Total Protein: 6.7 g/dL (ref 6.5–8.1)

## 2019-12-03 LAB — GLUCOSE, CAPILLARY
Glucose-Capillary: 346 mg/dL — ABNORMAL HIGH (ref 70–99)
Glucose-Capillary: 323 mg/dL — ABNORMAL HIGH (ref 70–99)
Glucose-Capillary: 386 mg/dL — ABNORMAL HIGH (ref 70–99)
Glucose-Capillary: 342 mg/dL — ABNORMAL HIGH (ref 70–99)
Glucose-Capillary: 371 mg/dL — ABNORMAL HIGH (ref 70–99)

## 2019-12-03 MED ORDER — INSULIN ASPART 100 UNIT/ML ~~LOC~~ SOLN
8.0000 [IU] | Freq: Three times a day (TID) | SUBCUTANEOUS | Status: DC
  Administered 2019-12-03 – 2019-12-04 (×3): 8 [IU] via SUBCUTANEOUS

## 2019-12-03 NOTE — Evaluation (Signed)
Occupational Therapy Evaluation Patient Details Name: [name] MRN: [id number] DOB: 1961/04/27 Today's Date: 12/03/2019    History of Present Illness Patient is a 59 y/o female who presents with SOB. Admitted with acute on chronic respiratory failure secondary to CHF exacerbation. CXR-showing hazy bilateral airspace opacities and small bilateral pleural effusions concerning for volume overload. PMH includes DM, tobacco abuse, smoker, CAD, PVD s/p stent, Afib, COPD, Cirrhosis, OSA, CKD, HTN, chronic combined systolic and diastolic CHF and cocaine, cocaine use/polysubstance abuse.   Clinical Impression   This 59 y/o female presents with the above. PTA pt reports mod independence with ADL and functional mobility, reports she lives with her cousin. Pt mildly impulsive and requiring safety cues for mobility/functional tasks this session, presenting with weakness, decreased activity tolerance. Pt currently requiring minA for room level mobility using RW, min-modA for LB ADL. Pt initially on RA upon arrival (had removed O2) with spO2 85%, SpO2 maintaining >90% on 1-2L during session and in the high 90s end of session while seated in recliner (on 1L). Pt will benefit from continued acute OT services, recommend follow up [facility] services after discharge to progress pt towards her PLOF. Will follow.     Follow Up Recommendations  Home health OT;Supervision - Intermittent    Equipment Recommendations  None recommended by OT           Precautions / Restrictions Precautions Precautions: Fall Precaution Comments: watch 02 Restrictions Weight Bearing Restrictions: No      Mobility Bed Mobility Overal bed mobility: Needs Assistance Bed Mobility: Supine to Sit     Supine to sit: Supervision;HOB elevated     General bed mobility comments: Supervision for safety/lines  Transfers Overall transfer level: Needs assistance Equipment used: Rolling walker (2 wheeled) Transfers: Sit to/from  Stand Sit to Stand: Min guard         General transfer comment: Min guard for safety due to impulsivity, bil knee instability noted but no buckling.    Balance Overall balance assessment: Needs assistance;History of Falls Sitting-balance support: Feet supported;No upper extremity supported Sitting balance-Leahy Scale: Good Sitting balance - Comments: Able to donn socks sitting EOB without difficulty.   Standing balance support: During functional activity Standing balance-Leahy Scale: Poor Standing balance comment: Requires UE support in standing.                           ADL either performed or assessed with clinical judgement   ADL Overall ADL's : Needs assistance/impaired Eating/Feeding: Modified independent;Sitting   Grooming: Set up;Min guard;Sitting   Upper Body Bathing: Min guard;Sitting   Lower Body Bathing: Minimal assistance;Sit to/from stand   Upper Body Dressing : Set up;Min guard;Sitting   Lower Body Dressing: Minimal assistance;Sit to/from stand Lower Body Dressing Details (indicate cue type and reason): able to don socks on her own, minA for standing balance Toilet Transfer: Minimal assistance;Ambulation;RW;+2 for safety/equipment Toilet Transfer Details (indicate cue type and reason): simulated via transfer to [place] and Hygiene: Minimal assistance;Moderate assistance;Sit to/from stand       Functional mobility during ADLs: Minimal assistance;Rolling walker;Cueing for safety;+2 for safety/equipment General ADL Comments: pt with weakness and decreased activity tolerance, compromised respiratory status and requiring use of O2      Vision         Perception     Praxis      Pertinent Vitals/Pain Pain Assessment: No/denies pain(tingling in bil feet)     Hand Dominance  Right   Extremity/Trunk Assessment Upper Extremity Assessment Upper Extremity Assessment: Generalized weakness   Lower Extremity  Assessment Lower Extremity Assessment: Defer to PT evaluation RLE Deficits / Details: hx of transmet amputation, does not have special shoe here       Communication Communication Communication: No difficulties   Cognition Arousal/Alertness: Awake/alert Behavior During Therapy: WFL for tasks assessed/performed;Impulsive Overall Cognitive Status: No family/caregiver present to determine baseline cognitive functioning                                 General Comments: Slightly impulsive. Follows commands well. Question some safety awareness, reports falls.   General Comments  O2 initially 85% on RA upon entry, with use of 1-2L SpO2 maintaining >90%, high 90s end of session while seated in recliner on 1L    Exercises     Shoulder Instructions      Home Living Family/patient expects to be discharged to:: Private residence Living Arrangements: Other relatives(cousin) Available Help at Discharge: Family;Available PRN/intermittently Type of Home: House Home Access: Stairs to enter [employer] of Steps: 8 Entrance Stairs-Rails: Right Home Layout: Two level Alternate Level Stairs-Number of Steps: flight Alternate Level Stairs-Rails: Right;Left Bathroom Shower/Tub: Teacher, early years/pre: Standard     Home Equipment: Environmental consultant - 2 wheels;Walker - 4 wheels;Cane - quad;Shower seat;Wheelchair - [employer];Hospital bed          Prior Functioning/Environment Level of Independence: Independent with assistive device(s)        Comments: Independent with ADLs, gets into bath on own. Uses cane vs RW. Cousin does grocery shopping. Does not drive.        OT Problem List: Decreased strength;Decreased range of motion;Impaired balance (sitting and/or standing);Decreased safety awareness;Decreased knowledge of use of DME or AE;Cardiopulmonary status limiting activity;Obesity;Decreased activity tolerance      OT Treatment/Interventions:  Self-care/ADL training;Therapeutic exercise;Energy conservation;DME and/or AE instruction;Therapeutic activities;Patient/family education;Balance training;Cognitive remediation/compensation    OT Goals(Current goals can be found in the care plan section) Acute Rehab OT Goals Patient Stated Goal: get better OT Goal Formulation: With patient Time For Goal Achievement: 12/17/19 Potential to Achieve Goals: Good  OT Frequency: Min 2X/week   Barriers to D/C:            Co-evaluation PT/OT/SLP Co-Evaluation/Treatment: Yes Reason for Co-Treatment: For patient/therapist safety;To address functional/ADL transfers   OT goals addressed during session: ADL's and self-care      AM-PAC OT "6 Clicks" Daily Activity     Outcome Measure Help from another person eating meals?: None Help from another person taking care of personal grooming?: A Little Help from another person toileting, which includes using toliet, bedpan, or urinal?: A Lot Help from another person bathing (including washing, rinsing, drying)?: A Little Help from another person to put on and taking off regular upper body clothing?: None Help from another person to put on and taking off regular lower body clothing?: A Little 6 Click Score: 19   End of Session Equipment Utilized During Treatment: Gait belt;Rolling walker;Oxygen Nurse Communication: Mobility status  Activity Tolerance: Patient tolerated treatment well Patient left: in chair;with call bell/phone within reach;with chair alarm set  OT Visit Diagnosis: Unsteadiness on feet (R26.81);Muscle weakness (generalized) (M62.81)                Time: [id number] OT Time Calculation (min): 18 min Charges:  OT General Charges $OT Visit: 1 Visit OT Evaluation $OT Eval Moderate  Complexity: [place], [place] Pager [phone] Office [phone]   Raymondo Band 12/03/2019, 2:17 PM

## 2019-12-03 NOTE — Progress Notes (Addendum)
PROGRESS NOTE    [name]  [id number] DOB: 05-17-1961 DOA: 12/01/2019 PCP: Tsosie Billing, MD (Inactive)     Brief Narrative:  [name] is a 59 y.o. female with medical history significant of alcoholic liver cirrhosis, CAD, carotid artery disease, chronic combined systolic and diastolic congestive heart failure, COPD, PAD, hypertension, hyperlipidemia, hypothyroidism, paroxysmal atrial fibrillation, type 2 diabetes, OSA not on CPAP, cocaine use/polysubstance abuse presenting to the ED via EMS for evaluation of shortness of breath.  Family called EMS because patient has been getting more lethargic over the last several days and having labored breathing. Patient reports 3-day history of dyspnea and orthopnea.  She is coughing up clear sputum.  Her legs are swollen. Patient appeared to be in respiratory distress on arrival to the ED.  She was wheezing.  BNP elevated at 938.  Chest x-ray showing hazy bilateral airspace opacities and small bilateral pleural effusions concerning for volume overload.  SARS-CoV-2 PCR test negative. Received albuterol continuous neb treatment, IV Solu-Medrol 125 mg, and IV Lasix 40 mg.  Admitted for acute hypoxemic respiratory failure secondary to acute on chronic diastolic heart failure as well as acute exacerbation of COPD.  New events last 24 hours / Subjective: Was off BiPAP overnight.  States that her breathing is better.  Assessment & Plan:   Principal Problem:   Acute exacerbation of CHF (congestive heart failure) ([name]) Active Problems:   DM (diabetes mellitus), type 2 with peripheral vascular complications ([name])   COPD with acute exacerbation ([name])   AKI (acute kidney injury) ([place])   Acute respiratory failure with hypoxia ([name])   Acute hypoxemic respiratory failure secondary to acute on chronic diastolic heart failure, acute COPD exacerbation -Respiratory viral panel including influenza and SARS-CoV-2 negative -Chest x-ray concerned  with prominent interstitial lung markings, bilateral pleural effusion, concerning for congestive heart failure -BiPAP as needed, currently on 2 L nasal cannula O2, continue to wean as able  Acute on chronic diastolic heart failure -BNP 938.7 -Echocardiogram revealed EF [id number], grade 3 diastolic dysfunction -Continue IV Lasix -Daily weights, strict I's and O's, fluid restriction diet  COPD exacerbation -Continue Solu-Medrol, breathing treatments  AKI on CKD stage IIIb -Baseline creatinine 1.2-1.4  -Remains elevated but stable on IV Lasix, continue to monitor closely -BMP from this morning still pending  Elevated troponin -Likely secondary to demand ischemia due to above illnesses -Troponin trended downward 22 --> 18  Insulin-dependent DM -Hemoglobin A1c 7.3  -Lantus, Novolog sliding-scale insulin.  Added mealtime coverage while on IV steroids  Paroxysmal A fibrillation -Currently in normal sinus rhythm -Continue amiodarone  -Continue Eliquis  HLD -Continue lipitor   CAD -Continue plavix   Hypothyroidism -Continue synthroid   Alcohol abuse -CIWA  Tobacco abuse -Nicotine patch  Asymptomatic bacteriuria -Urine culture pending   DVT prophylaxis: Eliquis Code Status: DNR Family Communication: No family at bedside Disposition Plan: Pending clinical improvement, remains fluid overloaded, continue IV Lasix, BiPAP as needed.  PT OT consulted   Consultants:   None  Procedures:   None  Antimicrobials:  Anti-infectives (From admission, onward)   None       Objective: Vitals:   12/03/19 0534 12/03/19 0700 12/03/19 0736 12/03/19 0800  BP: (!) 152/78   (!) 156/82  Pulse: 78     Resp: 14 (!) 28  (!) 23  Temp: 98.8 F (37.1 C)     TempSrc: Oral     SpO2: 99%  100% 95%  Weight: 103 kg  Height:        Intake/Output Summary (Last 24 hours) at 12/03/2019 1009 Last data filed at 12/03/2019 1005 Gross per 24 hour  Intake 1123 ml  Output 1800 ml  Net -677  ml   Filed Weights   12/02/19 0334 12/03/19 0534  Weight: 103.8 kg 103 kg    Examination: General exam: Appears calm and comfortable, but fatigued appearing Respiratory system: Expiratory wheezes.  On nasal cannula O2 without distress.[name] Respiratory effort normal. Cardiovascular system: S1 & S2 heard, RRR. + Bilateral nonpitting pedal edema. Gastrointestinal system: Abdomen is nondistended, soft and nontender. Normal bowel sounds heard. Central nervous system: Alert and oriented. Non focal exam. Speech clear  Extremities: Symmetric in appearance bilaterally  Skin: No rashes, lesions or ulcers on exposed skin  Psychiatry: Judgement and insight appear stable. Mood & affect appropriate.    Data Reviewed: I have personally reviewed following labs and imaging studies  CBC: Recent Labs  Lab 12/01/19 1837 12/01/19 1856 12/01/19 2225  WBC 8.0  --   --   NEUTROABS 6.0  --   --   HGB 8.5* 7.8* 9.5*  HCT 28.4* 23.0* 28.0*  MCV 94.7  --   --   PLT 299  --   --    Basic Metabolic Panel: Recent Labs  Lab 12/01/19 1837 12/01/19 1856 12/01/19 2225 12/02/19 0405  NA 142 142 144 143  K 4.4 4.2 4.0 4.4  CL 108  --   --  107  CO2 23  --   --  21*  GLUCOSE 103*  --   --  203*  BUN 43*  --   --  44*  CREATININE 1.85*  --   --  1.87*  CALCIUM 8.3*  --   --  8.4*   GFR: Estimated Creatinine Clearance: 38.8 mL/min (A) (by C-G formula based on SCr of 1.87 mg/dL (H)). Liver Function Tests: Recent Labs  Lab 12/01/19 1837 12/02/19 0405  AST 60* 54*  ALT 83* 83*  ALKPHOS 438* 434*  BILITOT 0.7 0.7  PROT 6.7 6.8  ALBUMIN 2.7* 2.8*   No results for input(s): LIPASE, AMYLASE in the last 168 hours. No results for input(s): AMMONIA in the last 168 hours. Coagulation Profile: No results for input(s): INR, PROTIME in the last 168 hours. Cardiac Enzymes: No results for input(s): CKTOTAL, CKMB, CKMBINDEX, TROPONINI in the last 168 hours. BNP (last 3 results) No results for input(s):  PROBNP in the last 8760 hours. HbA1C: Recent Labs    12/02/19 0407  HGBA1C 7.3*   CBG: Recent Labs  Lab 12/02/19 0939 12/02/19 1112 12/02/19 1627 12/02/19 2119 12/03/19 0640  GLUCAP 238* 327* 337* 286* 346*   Lipid Profile: No results for input(s): CHOL, HDL, LDLCALC, TRIG, CHOLHDL, LDLDIRECT in the last 72 hours. Thyroid Function Tests: No results for input(s): TSH, T4TOTAL, FREET4, T3FREE, THYROIDAB in the last 72 hours. Anemia Panel: No results for input(s): VITAMINB12, FOLATE, FERRITIN, TIBC, IRON, RETICCTPCT in the last 72 hours. Sepsis Labs: Recent Labs  Lab 12/02/19 0405  PROCALCITON <0.10    Recent Results (from the past 240 hour(s))  Respiratory Panel by RT PCR (Flu A&B, Covid) - Nasopharyngeal Swab     Status: None   Collection Time: 12/01/19  6:37 PM   Specimen: Nasopharyngeal Swab  Result Value Ref Range Status   SARS Coronavirus 2 by RT PCR NEGATIVE NEGATIVE Final    Comment: (NOTE) SARS-CoV-2 target nucleic acids are NOT DETECTED. The SARS-CoV-2 RNA is generally detectable  in upper respiratoy specimens during the acute phase of infection. The lowest concentration of SARS-CoV-2 viral copies this assay can detect is 131 copies/mL. A negative result does not preclude SARS-Cov-2 infection and should not be used as the sole basis for treatment or other patient management decisions. A negative result may occur with  improper specimen collection/handling, submission of specimen other than nasopharyngeal swab, presence of viral mutation(s) within the areas targeted by this assay, and inadequate number of viral copies (<131 copies/mL). A negative result must be combined with clinical observations, patient history, and epidemiological information. The expected result is Negative. Fact Sheet for Patients:  PinkCheek.be Fact Sheet for Healthcare Providers:  GravelBags.it This test is not yet ap proved or  cleared by the [place] FDA and  has been authorized for detection and/or diagnosis of SARS-CoV-2 by FDA under an Emergency Use Authorization (EUA). This EUA will remain  in effect (meaning this test can be used) for the duration of the COVID-19 declaration under Section 564(b)(1) of the Act, 21 U.S.C. section 360bbb-3(b)(1), unless the authorization is terminated or revoked sooner.    Influenza A by PCR NEGATIVE NEGATIVE Final   Influenza B by PCR NEGATIVE NEGATIVE Final    Comment: (NOTE) The Xpert Xpress SARS-CoV-2/FLU/RSV assay is intended as an aid in  the diagnosis of influenza from Nasopharyngeal swab specimens and  should not be used as a sole basis for treatment. Nasal washings and  aspirates are unacceptable for Xpert Xpress SARS-CoV-2/FLU/RSV  testing. Fact Sheet for Patients: PinkCheek.be Fact Sheet for Healthcare Providers: GravelBags.it This test is not yet approved or cleared by the [place] FDA and  has been authorized for detection and/or diagnosis of SARS-CoV-2 by  FDA under an Emergency Use Authorization (EUA). This EUA will remain  in effect (meaning this test can be used) for the duration of the  Covid-19 declaration under Section 564(b)(1) of the Act, 21  U.S.C. section 360bbb-3(b)(1), unless the authorization is  terminated or revoked. Performed at [facility], [place] [address], [place], [place] [address]   Urine culture     Status: Abnormal (Preliminary result)   Collection Time: 12/02/19 12:41 AM   Specimen: Urine, Clean Catch  Result Value Ref Range Status   Specimen Description URINE, CLEAN CATCH  Final   Special Requests   Final    NONE Performed at [facility], [place] [address], [place], [place] [address]    Culture >=100,000 COLONIES/mL GRAM NEGATIVE RODS (A)  Final   Report Status PENDING  Incomplete  MRSA PCR Screening     Status: None   Collection Time: 12/02/19   4:05 AM   Specimen: Nasal Mucosa; Nasopharyngeal  Result Value Ref Range Status   MRSA by PCR NEGATIVE NEGATIVE Final    Comment:        The GeneXpert MRSA Assay (FDA approved for NASAL specimens only), is one component of a comprehensive MRSA colonization surveillance program. It is not intended to diagnose MRSA infection nor to guide or monitor treatment for MRSA infections. Performed at [facility], [place] [address] [place], [place] [address]       Radiology Studies: DG Chest Port 1 View  Result Date: 12/01/2019 CLINICAL DATA:  Lethargy EXAM: PORTABLE CHEST 1 VIEW COMPARISON:  February 19, 2019 FINDINGS: There are hazy bilateral airspace opacities, greatest at the lung bases. There are slightly prominent interstitial lung markings. There appear to be small bilateral pleural effusions, right greater than left. The  heart size is enlarged. There is no pneumothorax. IMPRESSION: Overall findings concerning for congestive heart failure. A superimposed atypical infectious process is difficult to exclude. Electronically Signed   By: Constance Holster M.D.   On: 12/01/2019 20:59   ECHOCARDIOGRAM COMPLETE  Result Date: 12/02/2019   ECHOCARDIOGRAM REPORT   Patient Name:   [name] Date of Exam: 12/02/2019 Medical Rec #:  [id number]     Height:       64.0 in Accession #:    [id number]    Weight:       228.8 lb Date of Birth:  1960-11-17    BSA:          2.07 m Patient Age:    20 years      BP:           132/63 mmHg Patient Gender: F             HR:           82 bpm. Exam Location:  Inpatient Procedure: 2D Echo Indications:    CHF-Acute Systolic 0000000  History:        Patient has prior history of Echocardiogram examinations, most                 recent 10/22/2018. CHF, CAD, COPD, Arrythmias:Atrial                 Fibrillation; Risk Factors:Hypertension, Diabetes, Current                 Smoker and Sleep Apnea. CKD. Polysubstance abuse.  Sonographer:    Clayton Lefort RDCS (AE) Referring  Phys: [id number] [facility]  Sonographer Comments: Suboptimal parasternal window. Image acquisition challenging due to respiratory motion. On BiPAP. IMPRESSIONS  1. Left ventricular ejection fraction, by visual estimation, is 50%. The left ventricle has mildly decreased function. There is mildly increased left ventricular hypertrophy.  2. Left ventricular diastolic parameters are consistent with Grade III diastolic dysfunction (restrictive).  3. Mildly dilated left ventricular internal cavity size.  4. The left ventricle demonstrates regional wall motion abnormalities. Anterolateral hypokinesis.  5. Global right ventricle has normal systolic function.The right ventricular size is normal. No increase in right ventricular wall thickness.  6. Left atrial size was mildly dilated.  7. Right atrial size was mildly dilated.  8. Mild mitral annular calcification.  9. The mitral valve is normal in structure. Mild mitral valve regurgitation. No evidence of mitral stenosis. 10. The tricuspid valve is normal in structure. 11. The aortic valve is tricuspid. Aortic valve regurgitation is not visualized. Mild aortic valve sclerosis without stenosis. 12. The inferior vena cava is dilated in size with <50% respiratory variability, suggesting right atrial pressure of 15 mmHg. 13. The tricuspid regurgitant velocity is 3.35 m/s, and with an assumed right atrial pressure of 15 mmHg, the estimated right ventricular systolic pressure is moderately elevated at 59.9 mmHg. FINDINGS  Left Ventricle: Left ventricular ejection fraction, by visual estimation, is 50%. The left ventricle has mildly decreased function. The left ventricle demonstrates regional wall motion abnormalities. The left ventricular internal cavity size was mildly dilated left ventricle. There is mildly increased left ventricular hypertrophy. Left ventricular diastolic parameters are consistent with Grade III diastolic dysfunction (restrictive). Right Ventricle: The  right ventricular size is normal. No increase in right ventricular wall thickness. Global RV systolic function is has normal systolic function. The tricuspid regurgitant velocity is 3.35 m/s, and with an assumed right atrial pressure  of 15 mmHg, the  estimated right ventricular systolic pressure is moderately elevated at 59.9 mmHg. Left Atrium: Left atrial size was mildly dilated. Right Atrium: Right atrial size was mildly dilated Pericardium: There is no evidence of pericardial effusion. Mitral Valve: The mitral valve is normal in structure. There is mild calcification of the mitral valve leaflet(s). Mild mitral annular calcification. Mild mitral valve regurgitation. No evidence of mitral valve stenosis by observation. MV peak gradient, 9.2 mmHg. Tricuspid Valve: The tricuspid valve is normal in structure. Tricuspid valve regurgitation is trivial. Aortic Valve: The aortic valve is tricuspid. Aortic valve regurgitation is not visualized. Mild aortic valve sclerosis is present, with no evidence of aortic valve stenosis. Aortic valve mean gradient measures 5.0 mmHg. Aortic valve peak gradient measures 9.2 mmHg. Aortic valve area, by VTI measures 2.95 cm. Pulmonic Valve: The pulmonic valve was normal in structure. Pulmonic valve regurgitation is not visualized. Pulmonic regurgitation is not visualized. Aorta: The aortic root is normal in size and structure. Venous: The inferior vena cava is dilated in size with less than 50% respiratory variability, suggesting right atrial pressure of 15 mmHg. IAS/Shunts: No atrial level shunt detected by color flow Doppler.  LEFT VENTRICLE PLAX 2D LVIDd:         5.70 cm       Diastology LVIDs:         4.80 cm       LV e' lateral:   8.92 cm/s LV PW:         1.30 cm       LV E/e' lateral: 17.6 LV IVS:        1.30 cm       LV e' medial:    5.98 cm/s LVOT diam:     2.10 cm       LV E/e' medial:  26.3 LV SV:         53 ml LV SV Index:   23.69 LVOT Area:     3.46 cm  LV Volumes (MOD) LV  area d, A2C:    40.40 cm LV area d, A4C:    41.70 cm LV area s, A2C:    29.50 cm LV area s, A4C:    29.20 cm LV major d, A2C:   9.24 cm LV major d, A4C:   8.88 cm LV major s, A2C:   8.61 cm LV major s, A4C:   8.00 cm LV vol d, MOD A2C: 145.0 ml LV vol d, MOD A4C: 160.0 ml LV vol s, MOD A2C: 84.0 ml LV vol s, MOD A4C: 89.6 ml LV SV MOD A2C:     61.0 ml LV SV MOD A4C:     160.0 ml LV SV MOD BP:      65.8 ml RIGHT VENTRICLE             IVC RV Basal diam:  3.30 cm     IVC diam: 2.30 cm RV S prime:     12.00 cm/s TAPSE (M-mode): 2.2 cm LEFT ATRIUM             Index       RIGHT ATRIUM           Index LA diam:        4.30 cm 2.08 cm/m  RA Area:     19.20 cm LA Vol (A2C):   96.7 ml 46.68 ml/m RA Volume:   54.20 ml  26.16 ml/m LA Vol (A4C):   65.6 ml 31.67 ml/m LA Biplane Vol: 79.9 ml 38.57  ml/m  AORTIC VALVE AV Area (Vmax):    2.87 cm AV Area (Vmean):   2.73 cm AV Area (VTI):     2.95 cm AV Vmax:           152.00 cm/s AV Vmean:          109.000 cm/s AV VTI:            0.343 m AV Peak Grad:      9.2 mmHg AV Mean Grad:      5.0 mmHg LVOT Vmax:         126.00 cm/s LVOT Vmean:        86.000 cm/s LVOT VTI:          0.292 m LVOT/AV VTI ratio: 0.85  AORTA Ao Root diam: 2.70 cm MITRAL VALVE                         TRICUSPID VALVE MV Area (PHT): 5.42 cm              TR Peak grad:   44.9 mmHg MV Peak grad:  9.2 mmHg              TR Vmax:        335.00 cm/s MV Mean grad:  3.0 mmHg MV Vmax:       1.52 m/s              SHUNTS MV Vmean:      82.8 cm/s             Systemic VTI:  0.29 m MV VTI:        0.37 m                Systemic Diam: 2.10 cm MV PHT:        40.60 msec MV Decel Time: 140 msec MR Peak grad:    118.8 mmHg MR Mean grad:    79.0 mmHg MR Vmax:         545.00 cm/s MR Vmean:        425.0 cm/s MR PISA:         1.57 cm MR PISA Eff ROA: 9 mm MR PISA Radius:  0.50 cm MV E velocity: 157.00 cm/s 103 cm/s MV A velocity: 54.10 cm/s  70.3 cm/s MV E/A ratio:  2.90        1.5  Loralie Champagne MD Electronically signed by  Loralie Champagne MD Signature Date/Time: 12/02/2019/11:22:18 AM    Final       Scheduled Meds: . amiodarone  100 mg Oral Daily  . apixaban  5 mg Oral BID  . atorvastatin  40 mg Oral q1800  . budesonide (PULMICORT) nebulizer solution  0.25 mg Nebulization BID  . clopidogrel  75 mg Oral Daily  . dextromethorphan-guaiFENesin  1 tablet Oral BID  . folic acid  1 mg Oral Daily  . furosemide  40 mg Intravenous BID  . insulin aspart  0-5 Units Subcutaneous QHS  . insulin aspart  0-9 Units Subcutaneous TID WC  . insulin aspart  8 Units Subcutaneous TID WC  . insulin glargine  30 Units Subcutaneous QHS  . levothyroxine  50 mcg Oral Daily  . methylPREDNISolone (SOLU-MEDROL) injection  60 mg Intravenous Q6H  . multivitamin with minerals  1 tablet Oral Daily  . nicotine  21 mg Transdermal Daily  . nortriptyline  20 mg Oral QHS  . pantoprazole  40 mg Oral Daily  . sodium  chloride flush  3 mL Intravenous Q12H  . thiamine  100 mg Oral Daily   Or  . thiamine  100 mg Intravenous Daily   Continuous Infusions: . sodium chloride       LOS: 2 days      Time spent: 25 minutes   Dessa Phi, DO [facility] 12/03/2019, 10:09 AM   Available via Epic secure chat 7am-7pm After these hours, please refer to coverage provider listed on [email or web address]

## 2019-12-03 NOTE — Discharge Instructions (Signed)

## 2019-12-03 NOTE — Plan of Care (Signed)
  Problem: Education: Goal: Knowledge of General Education information will improve Description: Including pain rating scale, medication(s)/side effects and non-pharmacologic comfort measures Outcome: Progressing   Problem: Health Behavior/Discharge Planning: Goal: Ability to manage health-related needs will improve Outcome: Progressing   Problem: Clinical Measurements: Goal: Ability to maintain clinical measurements within normal limits will improve Outcome: Progressing Goal: Will remain free from infection Outcome: Progressing Goal: Diagnostic test results will improve Outcome: Progressing Goal: Respiratory complications will improve Outcome: Progressing Goal: Cardiovascular complication will be avoided Outcome: Progressing   Problem: Activity: Goal: Risk for activity intolerance will decrease Outcome: Progressing   Problem: Elimination: Goal: Will not experience complications related to bowel motility Outcome: Progressing Goal: Will not experience complications related to urinary retention Outcome: Progressing   Problem: Pain Managment: Goal: General experience of comfort will improve Outcome: Progressing   Problem: Safety: Goal: Ability to remain free from injury will improve Outcome: Progressing   Problem: Skin Integrity: Goal: Risk for impaired skin integrity will decrease Outcome: Progressing   

## 2019-12-03 NOTE — Evaluation (Signed)
Physical Therapy Evaluation Patient Details Name: [name] MRN: [id number] DOB: [birth year] Today's Date: 12/03/2019   History of Present Illness  Patient is a 59 y/o female who presents with SOB. Admitted with acute on chronic respiratory failure secondary to CHF exacerbation. CXR-showing hazy bilateral airspace opacities and small bilateral pleural effusions concerning for volume overload. PMH includes DM, tobacco abuse, smoker, CAD, PVD s/p stent, Afib, COPD, Cirrhosis, OSA, CKD, HTN, chronic combined systolic and diastolic CHF and cocaine, cocaine use/polysubstance abuse.  Clinical Impression  Patient presents with generalized weakness, tremoring BUEs/LEs, impulsivity, decreased activity tolerance and impaired mobility s/p above. Pt reports being Mod I PTA using Rw vs SPC, does report hx of falls. Lives with cousin and lots of animals. Today, pt tolerated bed mobility, transfers and gait training with Min guard assist for balance/safety. Noted to have BLE weakness. Sp02 dropped to 85% on RA, donned 1-2L/min 02 [place] and Sp02 in 90s. Will follow acutely to maximize independence and mobility prior to return home.    Follow Up Recommendations Home health PT;Supervision - Intermittent    Equipment Recommendations  None recommended by PT    Recommendations for Other Services       Precautions / Restrictions Precautions Precautions: Fall Precaution Comments: watch 02 Restrictions Weight Bearing Restrictions: No      Mobility  Bed Mobility Overal bed mobility: Needs Assistance Bed Mobility: Supine to Sit     Supine to sit: Supervision;HOB elevated     General bed mobility comments: Supervision for safety/lines  Transfers Overall transfer level: Needs assistance Equipment used: Rolling walker (2 wheeled) Transfers: Sit to/from Stand Sit to Stand: Min guard         General transfer comment: Min guard for safety due to impulsivity, bil knee instability noted but no  buckling.  Ambulation/Gait Ambulation/Gait assistance: Min guard Gait Distance (Feet): 16 Feet Assistive device: Rolling walker (2 wheeled) Gait Pattern/deviations: Step-through pattern;Decreased stride length Gait velocity: slightly impulsive   General Gait Details: Mildy unsteady gait with slightly fast pace, tremoring BLEs. Sp02 85% on RA, donned 1-2L/min 02 and Sp02 remained in 90s.  Stairs            Wheelchair Mobility    Modified Rankin (Stroke Patients Only)       Balance Overall balance assessment: Needs assistance;History of Falls Sitting-balance support: Feet supported;No upper extremity supported Sitting balance-Leahy Scale: Good Sitting balance - Comments: Able to donn socks sitting EOB without difficulty.   Standing balance support: During functional activity Standing balance-Leahy Scale: Poor Standing balance comment: Requires UE support in standing.                             Pertinent Vitals/Pain Pain Assessment: No/denies pain(tingling in bil feet)    Home Living Family/patient expects to be discharged to:: Private residence Living Arrangements: Other relatives(cousin) Available Help at Discharge: Family;Available PRN/intermittently Type of Home: House Home Access: Stairs to enter Entrance Stairs-Rails: Right Entrance Stairs-Number of Steps: 8 Home Layout: Two level Home Equipment: [place] - 2 wheels;Walker - 4 wheels;Cane - quad;Shower seat;Wheelchair - [employer];Hospital bed      Prior Function Level of Independence: Independent with assistive device(s)         Comments: Independent with ADLs, gets into bath on own. Uses cane vs RW. Cousin does grocery shopping. Does not drive.     Hand Dominance   Dominant Hand: Right    Extremity/Trunk Assessment   Upper  Extremity Assessment Upper Extremity Assessment: Defer to OT evaluation    Lower Extremity Assessment Lower Extremity Assessment: Generalized  weakness(some shaking/tremoring noted in BUEs/LEs- reports this is worse than normal) RLE Deficits / Details: hx of transmet amputation, does not have special shoe here RLE Sensation: history of peripheral neuropathy(distal to knees and into feet) LLE Sensation: history of peripheral neuropathy(distal to knees and into foot)       Communication   Communication: No difficulties  Cognition Arousal/Alertness: Awake/alert Behavior During Therapy: WFL for tasks assessed/performed;Impulsive Overall Cognitive Status: No family/caregiver present to determine baseline cognitive functioning                                 General Comments: Slightly impulsive. Follows commands well. Question some safety awareness, reports falls.      General Comments General comments (skin integrity, edema, etc.): SP02 ranged from 85-98% on RA-1-2L 02 [place].    Exercises     Assessment/Plan    PT Assessment Patient needs continued PT services  PT Problem List Decreased balance;Pain;Cardiopulmonary status limiting activity;Decreased strength;Decreased safety awareness;Decreased mobility       PT Treatment Interventions Therapeutic activities;Gait training;Therapeutic exercise;Patient/family education;Balance training;Functional mobility training;Stair training    PT Goals (Current goals can be found in the Care Plan section)  Acute Rehab PT Goals Patient Stated Goal: get better PT Goal Formulation: With patient Time For Goal Achievement: 12/17/19 Potential to Achieve Goals: Good    Frequency Min 3X/week   Barriers to discharge Inaccessible home environment stairs    Co-evaluation PT/OT/SLP Co-Evaluation/Treatment: Yes Reason for Co-Treatment: For patient/therapist safety;To address functional/ADL transfers PT goals addressed during session: Mobility/safety with mobility;Balance;Strengthening/ROM         AM-PAC PT "6 Clicks" Mobility  Outcome Measure Help needed turning from your  back to your side while in a flat bed without using bedrails?: None Help needed moving from lying on your back to sitting on the side of a flat bed without using bedrails?: A Little Help needed moving to and from a bed to a chair (including a wheelchair)?: A Little Help needed standing up from a chair using your arms (e.g., wheelchair or bedside chair)?: A Little Help needed to walk in hospital room?: A Little Help needed climbing 3-5 steps with a railing? : A Little 6 Click Score: 19    End of Session Equipment Utilized During Treatment: Gait belt Activity Tolerance: Patient tolerated treatment well Patient left: in chair;with call bell/phone within reach;with chair alarm set Nurse Communication: Mobility status PT Visit Diagnosis: Unsteadiness on feet (R26.81);Muscle weakness (generalized) (M62.81)    Time: [id number] PT Time Calculation (min) (ACUTE ONLY): 18 min   Charges:   PT Evaluation $PT Eval Moderate Complexity: 1 Mod          Marisa Severin, PT, DPT Acute Rehabilitation Services Pager [phone] Office [phone]      Marguarite Arbour A Berline Semrad 12/03/2019, 1:17 PM

## 2019-12-03 NOTE — Progress Notes (Signed)
Patient refuses to wear BiPAP and states "it makes me dry". Currently on 2 liters oxygen via nasal cannula, per orders. Current oxygen saturation is 93%, sitting in bedside chair. Patient alert and oriented X4, slightly drowsy. Maylene Roes, MD aware.

## 2019-12-03 NOTE — Progress Notes (Signed)
Patient refused to wear BiPAP multiple times. RT will continue to monitor as needed.

## 2019-12-04 ENCOUNTER — Encounter (HOSPITAL_COMMUNITY): Payer: Self-pay | Admitting: Internal Medicine

## 2019-12-04 DIAGNOSIS — N179 Acute kidney failure, unspecified: Secondary | ICD-10-CM

## 2019-12-04 DIAGNOSIS — E669 Obesity, unspecified: Secondary | ICD-10-CM

## 2019-12-04 DIAGNOSIS — E1169 Type 2 diabetes mellitus with other specified complication: Secondary | ICD-10-CM

## 2019-12-04 DIAGNOSIS — I2510 Atherosclerotic heart disease of native coronary artery without angina pectoris: Secondary | ICD-10-CM

## 2019-12-04 DIAGNOSIS — I5043 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) and diastolic (congestive) heart failure: Secondary | ICD-10-CM

## 2019-12-04 DIAGNOSIS — J9601 Acute respiratory failure with hypoxia: Secondary | ICD-10-CM

## 2019-12-04 DIAGNOSIS — I739 Peripheral vascular disease, unspecified: Secondary | ICD-10-CM

## 2019-12-04 DIAGNOSIS — I2721 Secondary pulmonary arterial hypertension: Secondary | ICD-10-CM

## 2019-12-04 DIAGNOSIS — E1151 Type 2 diabetes mellitus with diabetic peripheral angiopathy without gangrene: Secondary | ICD-10-CM

## 2019-12-04 DIAGNOSIS — I480 Paroxysmal atrial fibrillation: Secondary | ICD-10-CM

## 2019-12-04 DIAGNOSIS — J441 Chronic obstructive pulmonary disease with (acute) exacerbation: Secondary | ICD-10-CM

## 2019-12-04 LAB — URINE CULTURE: Culture: >=100000 — AB

## 2019-12-04 LAB — BASIC METABOLIC PANEL
Anion gap: 12 (ref 5–15)
Anion gap: 12 (ref 5–15)
BUN: 69 mg/dL — ABNORMAL HIGH (ref 6–20)
BUN: 66 mg/dL — ABNORMAL HIGH (ref 6–20)
CO2: 26 mmol/L (ref 22–32)
CO2: 27 mmol/L (ref 22–32)
Calcium: 8.7 mg/dL — ABNORMAL LOW (ref 8.9–10.3)
Calcium: 8.8 mg/dL — ABNORMAL LOW (ref 8.9–10.3)
Chloride: 97 mmol/L — ABNORMAL LOW (ref 98–111)
Chloride: 96 mmol/L — ABNORMAL LOW (ref 98–111)
Creatinine, Ser: 1.64 mg/dL — ABNORMAL HIGH (ref 0.44–1.00)
Creatinine, Ser: 1.57 mg/dL — ABNORMAL HIGH (ref 0.44–1.00)
GFR calc Af Amer: 40 mL/min — ABNORMAL LOW (ref >60)
GFR calc Af Amer: 42 mL/min — ABNORMAL LOW (ref >60)
GFR calc non Af Amer: 34 mL/min — ABNORMAL LOW (ref >60)
GFR calc non Af Amer: 36 mL/min — ABNORMAL LOW (ref >60)
Glucose, Bld: 433 mg/dL — ABNORMAL HIGH (ref 70–99)
Glucose, Bld: 477 mg/dL — ABNORMAL HIGH (ref 70–99)
Potassium: 4.9 mmol/L (ref 3.5–5.1)
Potassium: 4.9 mmol/L (ref 3.5–5.1)
Sodium: 135 mmol/L (ref 135–145)
Sodium: 135 mmol/L (ref 135–145)

## 2019-12-04 LAB — GLUCOSE, CAPILLARY
Glucose-Capillary: 446 mg/dL — ABNORMAL HIGH (ref 70–99)
Glucose-Capillary: 502 mg/dL (ref 70–99)
Glucose-Capillary: 426 mg/dL — ABNORMAL HIGH (ref 70–99)
Glucose-Capillary: 368 mg/dL — ABNORMAL HIGH (ref 70–99)

## 2019-12-04 LAB — GLUCOSE, RANDOM: Glucose, Bld: 467 mg/dL — ABNORMAL HIGH (ref 70–99)

## 2019-12-04 MED ORDER — INSULIN ASPART 100 UNIT/ML ~~LOC~~ SOLN
12.0000 [IU] | Freq: Three times a day (TID) | SUBCUTANEOUS | Status: DC
  Administered 2019-12-04 – 2019-12-05 (×2): 12 [IU] via SUBCUTANEOUS

## 2019-12-04 MED ORDER — METHYLPREDNISOLONE SODIUM SUCC 125 MG IJ SOLR
60.0000 mg | Freq: Three times a day (TID) | INTRAMUSCULAR | Status: DC
  Administered 2019-12-04 – 2019-12-05 (×2): 60 mg via INTRAVENOUS
  Filled 2019-12-04 (×2): qty 2

## 2019-12-04 MED ORDER — INSULIN GLARGINE 100 UNIT/ML ~~LOC~~ SOLN
34.0000 [IU] | Freq: Every day | SUBCUTANEOUS | Status: DC
  Administered 2019-12-04: 34 [IU] via SUBCUTANEOUS
  Filled 2019-12-04 (×2): qty 0.34

## 2019-12-04 MED ORDER — IPRATROPIUM-ALBUTEROL 0.5-2.5 (3) MG/3ML IN SOLN
3.0000 mL | Freq: Three times a day (TID) | RESPIRATORY_TRACT | Status: DC
  Administered 2019-12-04 – 2019-12-05 (×2): 3 mL via RESPIRATORY_TRACT
  Filled 2019-12-04: qty 3

## 2019-12-04 MED ORDER — INSULIN ASPART 100 UNIT/ML ~~LOC~~ SOLN
0.0000 [IU] | Freq: Three times a day (TID) | SUBCUTANEOUS | Status: DC
  Administered 2019-12-05 (×2): 11 [IU] via SUBCUTANEOUS
  Administered 2019-12-05: 12:00:00 8 [IU] via SUBCUTANEOUS
  Administered 2019-12-06: 2 [IU] via SUBCUTANEOUS
  Administered 2019-12-06 (×2): 5 [IU] via SUBCUTANEOUS
  Administered 2019-12-07: 3 [IU] via SUBCUTANEOUS
  Administered 2019-12-07: 07:00:00 8 [IU] via SUBCUTANEOUS
  Administered 2019-12-07: 11 [IU] via SUBCUTANEOUS
  Administered 2019-12-08: 17:00:00 5 [IU] via SUBCUTANEOUS
  Administered 2019-12-08: 08:00:00 2 [IU] via SUBCUTANEOUS
  Administered 2019-12-08: 3 [IU] via SUBCUTANEOUS
  Administered 2019-12-09: 13:00:00 8 [IU] via SUBCUTANEOUS
  Administered 2019-12-09: 3 [IU] via SUBCUTANEOUS

## 2019-12-04 MED ORDER — IPRATROPIUM-ALBUTEROL 0.5-2.5 (3) MG/3ML IN SOLN: 3.0000 mL | Freq: Three times a day (TID) | RESPIRATORY_TRACT | Status: DC

## 2019-12-04 MED ORDER — POLYETHYLENE GLYCOL 3350 17 G PO PACK
17.0000 g | PACK | Freq: Every day | ORAL | Status: DC | PRN
  Administered 2019-12-05: 17 g via ORAL
  Filled 2019-12-04 (×3): qty 1

## 2019-12-04 MED ORDER — INSULIN ASPART 100 UNIT/ML ~~LOC~~ SOLN
22.0000 [IU] | Freq: Once | SUBCUTANEOUS | Status: AC
  Administered 2019-12-04: 22 [IU] via SUBCUTANEOUS

## 2019-12-04 MED ORDER — INSULIN ASPART 100 UNIT/ML ~~LOC~~ SOLN
8.0000 [IU] | Freq: Once | SUBCUTANEOUS | Status: AC
  Administered 2019-12-04: 8 [IU] via SUBCUTANEOUS

## 2019-12-04 MED ORDER — SORBITOL 70 % SOLN
30.0000 mL | Status: AC
  Administered 2019-12-04: 13:00:00 30 mL via ORAL
  Filled 2019-12-04: qty 30

## 2019-12-04 NOTE — Significant Event (Signed)
patient Blood Sugar 506 Gave 22 unit once ASAP Per MD orders.

## 2019-12-04 NOTE — Progress Notes (Signed)
PROGRESS NOTE    [name]  [id number] DOB: 07/24/1961 DOA: 12/01/2019 PCP: Tsosie Billing, MD (Inactive)    Brief Narrative:  HPI per Dr. Annamary [name] [name] is a 59 y.o. female with medical history significant of alcoholic liver cirrhosis, CAD, carotid artery disease, chronic combined systolic and diastolic congestive heart failure, COPD, PAD, hypertension, hyperlipidemia, hypothyroidism, paroxysmal atrial fibrillation, type 2 diabetes, OSA not on CPAP, cocaine use/polysubstance abuse presenting to the ED via EMS for evaluation of shortness of breath.  Family called EMS because patient has been getting more lethargic over the last several days and having labored breathing. Patient reports 3-day history of dyspnea and orthopnea.  She is coughing up clear sputum.  Her legs are swollen.  States she takes her home medications but is not able to tell me which medication she takes.  States she uses 1 inhaler for her COPD.  Denies chest pain.  Denies fevers, chills, nausea, vomiting, abdominal pain, or diarrhea.  No other complaints.  ED Course: Afebrile and hemodynamically stable.  Not hypoxic.  Patient appeared to be in respiratory distress on arrival to the ED.  She was wheezing.  Labs showing no leukocytosis.  Hemoglobin 8.5, at baseline.  BNP elevated at 938.  Creatinine 1.8, baseline 1.2-1.3.  AST 60, ALT 83, alk phos 438, T bili 0.7.  High-sensitivity troponin 22.  It is reported in the chart that initial ABG showing pH 7.45, PCO2 37, and PO2 37.  I spoke to respiratory and they confirm that this is an error, this was a VBG.  Chest x-ray showing hazy bilateral airspace opacities and small bilateral pleural effusions concerning for volume overload.  SARS-CoV-2 PCR test negative. Received albuterol continuous neb treatment, IV Solu-Medrol 125 mg, and IV Lasix 40 mg.  Assessment & Plan:   Principal Problem:   Acute exacerbation of CHF (congestive heart failure) ([name]) Active  Problems:   DM (diabetes mellitus), type 2 with peripheral vascular complications ([name])   COPD with acute exacerbation ([name])   AKI (acute kidney injury) ([place])   Acute respiratory failure with hypoxia ([name])   1 acute hypoxemic respiratory failure secondary to acute on chronic diastolic CHF and acute COPD exacerbation Patient presented with worsening shortness of breath and orthopnea.  Respiratory viral panel was negative.  SARS corona virus 2 PCR negative.  Chest x-ray concerning for volume overload with interstitial lung markings, bilateral pleural effusion concerning for congestive heart failure.  Patient on IV Lasix with urine output of 2.3 L over the past 24 hours.  Patient is -2.184 L during this hospitalization.  2D echo done with EF of 95%, grade 3 diastolic dysfunction, left ventricular anterior lateral hypokinesis/regional wall motion abnormalities.  Current weight of 322.5 pounds from 228.84 pounds on admission.  Continue Lasix 40 mg IV every 12 hours, Plavix, IV steroid taper, PPI, Pulmicort nebs, Mucinex.  Place on DuoNeb's.  2.  Acute on chronic systolic and diastolic CHF exacerbation Patient presented with dyspnea and orthopnea.  Chest x-ray consistent with volume overload and bilateral pleural effusions.  2D echo done with EF of 09%, grade 3 diastolic dysfunction, left ventricular wall motion abnormalities.  Patient with some clinical improvement.  Patient with a urine output of 2.3 L over the past 24 hours.  Patient is -2.184 L during this hospitalization.  Current weight of 322.5 pounds from 228.84 pounds on admission.  Continue IV Lasix.  Continue Lipitor.  Hold off on beta-blocker due to acute decompensation.  Consult with  cardiology for further evaluation and management.  Follow.  3.  Acute kidney injury on chronic kidney disease stage IIIb Baseline creatinine 1.2-1.4.  Creatinine slowly trending down with diuresis.  Follow.  4.  Elevated troponin Patient noted to have a slightly  elevated troponin that was trending down felt secondary to demand ischemia secondary to problem #1.  2D echo however with wall motion abnormalities, grade 3 diastolic dysfunction, EF of 50%.  5.  Insulin-dependent diabetes mellitus Hemoglobin A1c of 7.3.  CBG of 446.  Elevated CBG likely secondary to steroids.  Increase Lantus to 34 units daily.  Increase meal coverage insulin to 12 units 3 times daily.  Sliding scale insulin.  6.  Hypothyroidism Continue home dose Synthroid.  7.  Paroxysmal A. fib In sinus rhythm.  Continue amiodarone.  Eliquis for anticoagulation.  8.  Hyperlipidemia Continue statin.  9.  Coronary artery disease Continue Plavix.  10.  History of alcohol abuse Stable.  No signs of withdrawal.  Continue Ativan withdrawal protocol.  11.  Tobacco abuse Tobacco cessation stressed to patient.  Nicotine patch.  12.  Asymptomatic bacteriuria Urine cultures pending.   DVT prophylaxis: Eliquis Code Status: DNR Family Communication: Updated patient.  No family at bedside. Disposition Plan: To be determined.  Likely home when clinically improved.   Consultants:   None  Procedures:   Chest x-ray 12/01/2019  2D echo 12/02/2019    Antimicrobials:   None   Subjective: Some improvement with SOB.  Some improvement with wheezing.  States she is trying to quit smoking cigarettes.  No chest pain.  Sitting up in bedside.  Objective: Vitals:   12/04/19 0031 12/04/19 0323 12/04/19 0744 12/04/19 0817  BP: (!) 152/73   (!) 156/73  Pulse: 82  85 84  Resp: '17  18 18  ' Temp: 98.3 F (36.8 C)   98.3 F (36.8 C)  TempSrc: Oral   Oral  SpO2: 96%  96% 98%  Weight:  (!) 146.3 kg    Height:        Intake/Output Summary (Last 24 hours) at 12/04/2019 1103 Last data filed at 12/04/2019 0900 Gross per 24 hour  Intake 1040 ml  Output 2000 ml  Net -960 ml   Filed Weights   12/02/19 0334 12/03/19 0534 12/04/19 0323  Weight: 103.8 kg 103 kg (!) 146.3 kg     Examination:  General exam: Appears calm and comfortable  Respiratory system: Diffuse crackles. Some minimal expitratory wheezing.  Cardiovascular system: S1 & S2 heard, RRR. No JVD, murmurs, rubs, gallops or clicks.  Trace bilateral lower extremity edema.  Gastrointestinal system: Abdomen is nondistended, soft and nontender. No organomegaly or masses felt. Normal bowel sounds heard. Central nervous system: Alert and oriented. No focal neurological deficits. Extremities: Status post right transmetatarsal amputation.  Symmetric 5 x 5 power. Skin: No rashes, lesions or ulcers Psychiatry: Judgement and insight appear normal. Mood & affect appropriate.     Data Reviewed: I have personally reviewed following labs and imaging studies  CBC: Recent Labs  Lab 12/01/19 1837 12/01/19 1856 12/01/19 2225  WBC 8.0  --   --   NEUTROABS 6.0  --   --   HGB 8.5* 7.8* 9.5*  HCT 28.4* 23.0* 28.0*  MCV 94.7  --   --   PLT 299  --   --    Basic Metabolic Panel: Recent Labs  Lab 12/01/19 1837 12/01/19 1837 12/01/19 1856 12/01/19 2225 12/02/19 0405 12/03/19 1320 12/04/19 0340  NA 142   < >  142 144 143 135 135  K 4.4   < > 4.2 4.0 4.4 4.8 4.9  CL 108  --   --   --  107 99 97*  CO2 23  --   --   --  21* 26 26  GLUCOSE 103*  --   --   --  203* 342* 433*  BUN 43*  --   --   --  44* 63* 69*  CREATININE 1.85*  --   --   --  1.87* 1.75* 1.64*  CALCIUM 8.3*  --   --   --  8.4* 8.5* 8.7*   < > = values in this interval not displayed.   GFR: Estimated Creatinine Clearance: 54.5 mL/min (A) (by C-G formula based on SCr of 1.64 mg/dL (H)). Liver Function Tests: Recent Labs  Lab 12/01/19 1837 12/02/19 0405 12/03/19 1320  AST 60* 54* 29  ALT 83* 83* 66*  ALKPHOS 438* 434* 439*  BILITOT 0.7 0.7 0.5  PROT 6.7 6.8 6.7  ALBUMIN 2.7* 2.8* 2.8*   No results for input(s): LIPASE, AMYLASE in the last 168 hours. No results for input(s): AMMONIA in the last 168 hours. Coagulation Profile: No  results for input(s): INR, PROTIME in the last 168 hours. Cardiac Enzymes: No results for input(s): CKTOTAL, CKMB, CKMBINDEX, TROPONINI in the last 168 hours. BNP (last 3 results) No results for input(s): PROBNP in the last 8760 hours. HbA1C: Recent Labs    12/02/19 0407  HGBA1C 7.3*   CBG: Recent Labs  Lab 12/03/19 1117 12/03/19 1650 12/03/19 2059 12/03/19 2328 12/04/19 0554  GLUCAP 323* 386* 342* 371* 446*   Lipid Profile: No results for input(s): CHOL, HDL, LDLCALC, TRIG, CHOLHDL, LDLDIRECT in the last 72 hours. Thyroid Function Tests: No results for input(s): TSH, T4TOTAL, FREET4, T3FREE, THYROIDAB in the last 72 hours. Anemia Panel: No results for input(s): VITAMINB12, FOLATE, FERRITIN, TIBC, IRON, RETICCTPCT in the last 72 hours. Sepsis Labs: Recent Labs  Lab 12/02/19 0405  PROCALCITON <0.10    Recent Results (from the past 240 hour(s))  Respiratory Panel by RT PCR (Flu A&B, Covid) - Nasopharyngeal Swab     Status: None   Collection Time: 12/01/19  6:37 PM   Specimen: Nasopharyngeal Swab  Result Value Ref Range Status   SARS Coronavirus 2 by RT PCR NEGATIVE NEGATIVE Final    Comment: (NOTE) SARS-CoV-2 target nucleic acids are NOT DETECTED. The SARS-CoV-2 RNA is generally detectable in upper respiratoy specimens during the acute phase of infection. The lowest concentration of SARS-CoV-2 viral copies this assay can detect is 131 copies/mL. A negative result does not preclude SARS-Cov-2 infection and should not be used as the sole basis for treatment or other patient management decisions. A negative result may occur with  improper specimen collection/handling, submission of specimen other than nasopharyngeal swab, presence of viral mutation(s) within the areas targeted by this assay, and inadequate number of viral copies (<131 copies/mL). A negative result must be combined with clinical observations, patient history, and epidemiological information.  The expected result is Negative. Fact Sheet for Patients:  PinkCheek.be Fact Sheet for Healthcare Providers:  GravelBags.it This test is not yet ap proved or cleared by the [place] FDA and  has been authorized for detection and/or diagnosis of SARS-CoV-2 by FDA under an Emergency Use Authorization (EUA). This EUA will remain  in effect (meaning this test can be used) for the duration of the COVID-19 declaration under Section 564(b)(1) of the Act, 21 U.S.C. section  360bbb-3(b)(1), unless the authorization is terminated or revoked sooner.    Influenza A by PCR NEGATIVE NEGATIVE Final   Influenza B by PCR NEGATIVE NEGATIVE Final    Comment: (NOTE) The Xpert Xpress SARS-CoV-2/FLU/RSV assay is intended as an aid in  the diagnosis of influenza from Nasopharyngeal swab specimens and  should not be used as a sole basis for treatment. Nasal washings and  aspirates are unacceptable for Xpert Xpress SARS-CoV-2/FLU/RSV  testing. Fact Sheet for Patients: PinkCheek.be Fact Sheet for Healthcare Providers: GravelBags.it This test is not yet approved or cleared by the [place] FDA and  has been authorized for detection and/or diagnosis of SARS-CoV-2 by  FDA under an Emergency Use Authorization (EUA). This EUA will remain  in effect (meaning this test can be used) for the duration of the  Covid-19 declaration under Section 564(b)(1) of the Act, 21  U.S.C. section 360bbb-3(b)(1), unless the authorization is  terminated or revoked. Performed at [facility], [place] [address] [place], [place] [address]   Urine culture     Status: Abnormal   Collection Time: 12/02/19 12:41 AM   Specimen: Urine, Clean Catch  Result Value Ref Range Status   Specimen Description URINE, CLEAN CATCH  Final   Special Requests   Final    NONE Performed at [facility], [place] [address] [place], [place] [address]    Culture >=100,000 COLONIES/mL KLEBSIELLA PNEUMONIAE (A)  Final   Report Status 12/04/2019 FINAL  Final   Organism ID, Bacteria KLEBSIELLA PNEUMONIAE (A)  Final      Susceptibility   Klebsiella pneumoniae - MIC*    AMPICILLIN RESISTANT Resistant     CEFAZOLIN <=4 SENSITIVE Sensitive     CEFTRIAXONE <=0.25 SENSITIVE Sensitive     CIPROFLOXACIN <=0.25 SENSITIVE Sensitive     GENTAMICIN <=1 SENSITIVE Sensitive     IMIPENEM <=0.25 SENSITIVE Sensitive     NITROFURANTOIN 128 RESISTANT Resistant     TRIMETH/SULFA <=20 SENSITIVE Sensitive     AMPICILLIN/SULBACTAM <=2 SENSITIVE Sensitive     PIP/TAZO <=4 SENSITIVE Sensitive     * >=100,000 COLONIES/mL KLEBSIELLA PNEUMONIAE  MRSA PCR Screening     Status: None   Collection Time: 12/02/19  4:05 AM   Specimen: Nasal Mucosa; Nasopharyngeal  Result Value Ref Range Status   MRSA by PCR NEGATIVE NEGATIVE Final    Comment:        The GeneXpert MRSA Assay (FDA approved for NASAL specimens only), is one component of a comprehensive MRSA colonization surveillance program. It is not intended to diagnose MRSA infection nor to guide or monitor treatment for MRSA infections. Performed at [facility], [place] [address] [place], [place] [address]          Radiology Studies: No results found.      Scheduled Meds: . amiodarone  100 mg Oral Daily  . apixaban  5 mg Oral BID  . atorvastatin  40 mg Oral q1800  . budesonide (PULMICORT) nebulizer solution  0.25 mg Nebulization BID  . clopidogrel  75 mg Oral Daily  . dextromethorphan-guaiFENesin  1 tablet Oral BID  . folic acid  1 mg Oral Daily  . furosemide  40 mg Intravenous BID  . insulin aspart  0-5 Units Subcutaneous QHS  . insulin aspart  0-9 Units Subcutaneous TID WC  . insulin aspart  12 Units Subcutaneous TID WC  . insulin glargine  34 Units Subcutaneous QHS  . levothyroxine  50 mcg Oral Daily  .  methylPREDNISolone (SOLU-MEDROL) injection  60 mg  Intravenous Q6H  . multivitamin with minerals  1 tablet Oral Daily  . nicotine  21 mg Transdermal Daily  . nortriptyline  20 mg Oral QHS  . pantoprazole  40 mg Oral Daily  . sodium chloride flush  3 mL Intravenous Q12H  . sorbitol  30 mL Oral Q2H  . thiamine  100 mg Oral Daily   Continuous Infusions: . sodium chloride       LOS: 3 days    Time spent: 35 minutes    Irine Seal, MD [facility]  If 7PM-7AM, please contact night-coverage [email or web address] 12/04/2019, 11:03 AM

## 2019-12-04 NOTE — TOC Initial Note (Signed)
Transition of Care [facility]) - Initial/Assessment Note    Patient Details  Name: [name] MRN: [id number] Date of Birth: [birth year]  Transition of Care [facility]) CM/SW Contact:    Alberteen Sam, [place] Phone Number: [phone] 12/04/2019, 2:05 PM  Clinical Narrative:                  CSW spoke with patient regarding PT/OT recommendation of home health therapies at time of discharge. She reports being in agreement with this and confirmed address in chart as accurate. She states no preference to agency and gave CSW permission to send referrals out to identify accepting agency for home health PT and OT. No equipment needs identified at this time.   CSW sent referral to [facility], not able to accept.  CSW sent referral to [facility], they are reviewing chart, pending response.   Expected Discharge Plan: [place] Barriers to Discharge: Continued Medical Work up   Patient Goals and CMS Choice Patient states their goals for this hospitalization and ongoing recovery are:: to go home CMS Medicare.gov Compare Post Acute Care list provided to:: Patient Choice offered to / list presented to : Patient  Expected Discharge Plan and Services Expected Discharge Plan: [place] Acute Care Choice: [place] arrangements for the past 2 months: Single Family Home                           HH Arranged: PT, OT [place] Agency: [facility] (formerly [employer]) Date [place]: 12/04/19 Time HH Agency Contacted: [place] Representative spoke with at [place]: [place]  Prior Living Arrangements/Services Living arrangements for the past 2 months: [place] with:: Roommate Patient language and need for interpreter reviewed:: Yes Do you feel safe going back to the place where you live?: Yes      Need for Family Participation in Patient Care: Yes (Comment) Care giver support system in place?: Yes (comment)   Criminal  Activity/Legal Involvement Pertinent to Current Situation/Hospitalization: No - Comment as needed  Activities of Daily Living Home Assistive Devices/Equipment: BIPAP ADL Screening (condition at time of admission) Patient's cognitive ability adequate to safely complete daily activities?: Yes Is the patient deaf or have difficulty hearing?: No Does the patient have difficulty seeing, even when wearing glasses/contacts?: No Does the patient have difficulty concentrating, remembering, or making decisions?: No Patient able to express need for assistance with ADLs?: Yes Does the patient have difficulty dressing or bathing?: No Independently performs ADLs?: Yes (appropriate for developmental age) Does the patient have difficulty walking or climbing stairs?: Yes Weakness of Legs: Both Weakness of Arms/Hands: None  Permission Sought/Granted Permission sought to share information with : Case Manager, Customer service manager, Family Supports    Share Information with NAME: Tammy  Permission granted to share info w AGENCY: [place] granted to share info w Relationship: roommate  Permission granted to share info w Contact Information: [phone]  Emotional Assessment Appearance:: Appears stated age Attitude/Demeanor/Rapport: Gracious Affect (typically observed): Calm Orientation: : Oriented to Self, Oriented to Place, Oriented to  Time, Oriented to Situation Alcohol / Substance Use: Not Applicable Psych Involvement: No (comment)  Admission diagnosis:  Acute exacerbation of CHF (congestive heart failure) ([place]) [I50.9] Acute respiratory failure with hypoxia ([place]) [J96.01] Acute renal failure, unspecified acute renal failure type ([place]) [N17.9] Altered mental status, unspecified altered mental status type [  R41.82] Acute congestive heart failure, unspecified heart failure type [facility]) [I50.9] Patient Active Problem List   Diagnosis Date Noted  . Acute respiratory failure  with hypoxia ([place]) 12/02/2019  . Acute exacerbation of CHF (congestive heart failure) ([place]) 12/01/2019  . Cellulitis in diabetic foot ([place]) 07/08/2019  . Osteomyelitis ([place]) 06/21/2019  . NICM (nonischemic cardiomyopathy) ([place]) 06/20/2019  . Non-healing ulcer ([place]) 06/20/2019  . Acute CHF (congestive heart failure) ([place]) 11/26/2018  . CHF (congestive heart failure) ([place]) 11/26/2018  . Acute respiratory failure ([place]) 10/21/2018  . AKI (acute kidney injury) ([place]) 08/18/2018  . Coagulation disorder ([place]) 08/09/2017  . Depression 07/21/2017  . At risk for adverse drug reaction 06/20/2017  . Peripheral neuropathy 06/20/2017  . Acute osteomyelitis of right foot ([place]) 06/13/2017  . S/P transmetatarsal amputation of foot, right ([place]) 06/05/2017  . 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[place])   . SIRS (systemic inflammatory response syndrome) ([place]) 04/06/2017  . CKD (chronic kidney disease), stage III ([place]) 11/24/2016  . Suspected sleep apnea 11/24/2016  . Ulcer of toe of right foot, with necrosis of bone ([place]) 10/27/2016  . Ulcer of left lower leg ([place]) 05/19/2016  . Severe obesity (BMI >= 40) ([place]) 02/24/2016  . COPD GOLD 0  02/24/2016  . Encounter for therapeutic drug monitoring 02/10/2016  . Symptomatic bradycardia 01/12/2016  . Essential hypertension 12/22/2015  . Chronic combined systolic and diastolic CHF (congestive heart failure) ([place])   . Wheeze   . 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 Tobacco abuse 10/23/2015  . Coronary artery disease   . DOE (dyspnea on exertion) 04/29/2015  . Diabetes mellitus type 2, uncontrolled ([place]) 02/08/2015  . Influenza A 02/07/2015  . Wrist fracture, left, with routine healing, subsequent encounter 02/05/2015  . Wrist fracture, left, closed, initial encounter 01/29/2015  . PAF (paroxysmal atrial fibrillation) ([place]) 01/16/2015  . Carotid  arterial disease ([place]) 01/16/2015  . Claudication ([place]) 01/15/2015  . Demand ischemia ([place]) 10/29/2014  . Insomnia 02/03/2014  . COPD with acute exacerbation ([place]) 02/01/2014  . S/P peripheral artery angioplasty - [place]boH[place] atherectomy; R SFA 09/11/2013    Class: Acute  . Leg pain, bilateral 08/19/2013  . Hypothyroidism 07/31/2013  . Cellulitis 06/13/2013  . History of cocaine abuse ([place]) 06/13/2013  . Long term current use of anticoagulant therapy 05/20/2013  . Alcohol abuse   . Narcotic abuse ([place])   . Marijuana abuse   . Alcoholic cirrhosis ([place])   . DM (diabetes mellitus), type 2 with peripheral vascular complications ([place])    PCP:  Tsosie Billing, MD (Inactive) Pharmacy:   [id number] [address] [place], [place] - [place] [address] Phone: [phone] Fax: [phone]  [facility] [place] [place], [place] [address] Phone: [phone] Fax: [place], [place] - [address] [place] [address] Phone: [phone] Fax: [phone]     Social Determinants of Health (SDOH) Interventions    Readmission Risk Interventions No flowsheet data found.

## 2019-12-04 NOTE — Plan of Care (Signed)
  Problem: Clinical Measurements: Goal: Will remain free from infection Outcome: Progressing Goal: Respiratory complications will improve Outcome: Progressing   Problem: Activity: Goal: Risk for activity intolerance will decrease Outcome: Progressing   Problem: Safety: Goal: Ability to remain free from injury will improve Outcome: Progressing   

## 2019-12-04 NOTE — Progress Notes (Signed)
Pt. Requesting med for constipation. On call for [facility] paged to make aware.

## 2019-12-04 NOTE — Progress Notes (Signed)
Physical Therapy Treatment Patient Details Name: [name] MRN: [id number] DOB: [birth year] Today's Date: 12/04/2019    History of Present Illness Patient is a 59 y/o female who presents with SOB. Admitted with acute on chronic respiratory failure secondary to CHF exacerbation. CXR-showing hazy bilateral airspace opacities and small bilateral pleural effusions concerning for volume overload. PMH includes DM, tobacco abuse, smoker, CAD, PVD s/p stent, Afib, COPD, Cirrhosis, OSA, CKD, HTN, chronic combined systolic and diastolic CHF and cocaine, cocaine use/polysubstance abuse.    PT Comments    Pt progressing steadily towards her physical therapy goals. No tremors noted this session. Ambulating 50 feet with a walker at a min guard assist level; SpO2 90-96% on RA. Pt is a limited household ambulator at baseline. D/c plan remains appropriate.     Follow Up Recommendations  Home health PT;Supervision - Intermittent     Equipment Recommendations  None recommended by PT    Recommendations for Other Services       Precautions / Restrictions Precautions Precautions: Fall Restrictions Weight Bearing Restrictions: No    Mobility  Bed Mobility               General bed mobility comments: OOB in chair  Transfers Overall transfer level: Needs assistance Equipment used: Rolling walker (2 wheeled) Transfers: Sit to/from Stand Sit to Stand: Min guard            Ambulation/Gait Ambulation/Gait assistance: Min guard Gait Distance (Feet): 50 Feet Assistive device: Rolling walker (2 wheeled) Gait Pattern/deviations: Step-through pattern;Decreased stride length Gait velocity: decreased   General Gait Details: Min guard for stability   Stairs             Wheelchair Mobility    Modified Rankin (Stroke Patients Only)       Balance Overall balance assessment: Needs assistance;History of Falls Sitting-balance support: Feet supported;No upper extremity  supported Sitting balance-Leahy Scale: Good     Standing balance support: Bilateral upper extremity supported Standing balance-Leahy Scale: Poor Standing balance comment: Requires UE support in standing.                            Cognition Arousal/Alertness: Awake/alert Behavior During Therapy: WFL for tasks assessed/performed;Impulsive Overall Cognitive Status: No family/caregiver present to determine baseline cognitive functioning                                 General Comments: Follows all simple commands, pleasant and willing to participate in therapy      Exercises General Exercises - Lower Extremity Long Arc Quad: Both;20 reps;Seated Hip Flexion/Marching: Both;20 reps;Seated    General Comments        Pertinent Vitals/Pain Pain Assessment: No/denies pain    Home Living                      Prior Function            PT Goals (current goals can now be found in the care plan section) Acute Rehab PT Goals Potential to Achieve Goals: Good Progress towards PT goals: Progressing toward goals    Frequency    Min 3X/week      PT Plan Current plan remains appropriate    Co-evaluation              AM-PAC PT "6 Clicks" Mobility   Outcome Measure  Help needed turning  from your back to your side while in a flat bed without using bedrails?: None Help needed moving from lying on your back to sitting on the side of a flat bed without using bedrails?: A Little Help needed moving to and from a bed to a chair (including a wheelchair)?: A Little Help needed standing up from a chair using your arms (e.g., wheelchair or bedside chair)?: A Little Help needed to walk in hospital room?: A Little Help needed climbing 3-5 steps with a railing? : A Little 6 Click Score: 19    End of Session Equipment Utilized During Treatment: Gait belt Activity Tolerance: Patient tolerated treatment well Patient left: in chair;with call bell/phone  within reach Nurse Communication: Mobility status PT Visit Diagnosis: Unsteadiness on feet (R26.81);Muscle weakness (generalized) (M62.81)     Time: [id number] PT Time Calculation (min) (ACUTE ONLY): 15 min  Charges:  $Therapeutic Activity: 8-22 mins                     Ellamae Sia, PT, DPT Acute Rehabilitation Services Pager [phone] Office [phone]    Willy Eddy 12/04/2019, 4:06 PM

## 2019-12-04 NOTE — Consult Note (Signed)
Cardiology Consult    Patient ID: [name] MRN: [id number], DOB/AGE: 1961-03-14   Admit date: 12/01/2019 Date of Consult: 12/04/2019  Primary Physician: Tsosie Billing, MD (Inactive) Primary Cardiologist: Quay Burow, MD Requesting Provider: Irine Seal, MD   Patient Profile    [name] is a 59 y.o. female with a history of small vessel coronary artery disease, chronic combined systolic and diastolic congestive heart failure with an EF of 50%, peripheral arterial disease status post left femoral to left anterior tibial bypass in August 2020, carotid arterial disease status post left carotid endarterectomy, hypertension, hyperlipidemia, diabetes, alcohol abuse with alcoholic cirrhosis, polysubstance abuse, hypothyroidism, paroxysmal atrial fibrillation, CKD III, and sleep apnea who is being seen today for the evaluation of congestive heart failure at the request of Dr. Grandville Silos.  Past Medical History   Past Medical History:  Diagnosis Date  . Alcohol abuse   . Alcoholic cirrhosis ([place])   . Anemia   . Anxiety   . Arthritis    "knees" (11/26/2018)  . B12 deficiency   . CAD (coronary artery disease)    a. 11/10/2014 Cath: LM nl, LAD min irregs, D1 30 ost, D2 50d, LCX 42m, OM1 80 p/m (1.5 mm vessel), OM2 39m, RCA nondom 10m-->med rx.. Demand ischemia in the setting of rapid a-fib.  . Cardiomyopathy ([place])   . Carotid artery disease ([place])    a. 01/2015 Carotid Angio: RICA [id number], LICA [id number]; b. [id number] s/p L CEA; c. 05/2019 Carotid U/S: RICA 100. RECA >50. LICA [id number].  . Cellulitis    lower extremities  . Chronic combined systolic and diastolic CHF (congestive heart failure) ([place])    a. 10/2014 Echo: EF 40-45%; b. 10/2018 Echo: EF 45-50%, gr2 DD; c. 11/2019 Echo: EF 50%, mild LVH, gr2 DD (restrictive), antlat HK, Nl RV fxn. Mild BAE. RVSP 59.68mmHg.  . CKD (chronic kidney disease), stage III   . Cocaine abuse ([place])   . COPD (chronic obstructive pulmonary disease)  ([place])   . Critical lower limb ischemia   . Depression   . Diabetic peripheral neuropathy ([place])   . Dyspnea   . Elevated troponin    a. Chronic elevation.  [name] GERD (gastroesophageal reflux disease)   . Hyperlipemia   . Hypertension   . Hypokalemia   . Hypomagnesemia   . Hypothyroidism   . Marijuana abuse   . Narcotic abuse ([place])   . Noncompliance   . NSVT (nonsustained ventricular tachycardia) ([place])   . Obesity   . PAF (paroxysmal atrial fibrillation) ([place])   . Paroxysmal atrial tachycardia ([place])   . Peripheral arterial disease ([place])    a. 01/2015 Angio/PTA: RSFA 99 (atherectomy/pta) - 1 vessel runoff via diff dzs peroneal; b. 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  . Pneumonia    "once or twice" (11/26/2018)  . Poorly controlled type 2 diabetes mellitus ([place])   . Renal insufficiency    a. 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  [name] Sleep apnea    "couldn't handle wearing the mask" (11/26/2018)  . Symptomatic bradycardia    a. Avoid AV blocking agent per EP. Prev req temp wire in 2017.  . Tobacco abuse     Past Surgical History:  Procedure Laterality Date  . ABDOMINAL AORTOGRAM N/A 06/26/2019   Procedure: ABDOMINAL AORTOGRAM;  Surgeon: Wellington Hampshire, MD;  Location: [facility];  Service: Cardiovascular;  Laterality: N/A;  . AMPUTATION Right 06/14/2017   Procedure: Right foot  transmetatarsal amputation;  Surgeon: Newt Minion, MD;  Location: [facility];  Service: Orthopedics;  Laterality: Right;  . AMPUTATION Left 06/28/2019   Procedure: AMPUTATION LEFT FIFTH TOE;  Surgeon: Rosetta Posner, MD;  Location: [place];  Service: Vascular;  Laterality: Left;  . AMPUTATION TOE Right 04/28/2017   Procedure: AMPUTATION OF RIGHT SECOND RAY;  Surgeon: Newt Minion, MD;  Location: [facility];  Service: Orthopedics;  Laterality: Right;  . CARDIAC CATHETERIZATION    . CARDIAC CATHETERIZATION N/A 01/12/2016   Procedure: Temporary Wire;  Surgeon: Minus Breeding, MD;  Location: [facility];  Service: Cardiovascular;  Laterality: N/A;  . CARDIOVERSION  ~ 02/2013   "twice"   . CAROTID ANGIOGRAM N/A 01/15/2015   Procedure: CAROTID ANGIOGRAM;  Surgeon: [name], MD;  Location: [facility];  Service: Cardiovascular;  Laterality: N/A;  . DILATION AND CURETTAGE OF UTERUS  1988  . ENDARTERECTOMY Left 02/19/2015   Procedure: LEFT CAROTID ENDARTERECTOMY ;  Surgeon: Serafina Mitchell, MD;  Location: [facility];  Service: Vascular;  Laterality: Left;  . FEMORAL-TIBIAL BYPASS GRAFT Left 06/28/2019   Procedure: BYPASS GRAFT LEFT LEG FEMORAL TO ANTERIOR TIBIAL ARTERY using LEFT GREATER SAPHENOUS VEIN;  Surgeon: Rosetta Posner, MD;  Location: [facility];  Service: Vascular;  Laterality: Left;  . LEFT HEART CATHETERIZATION WITH CORONARY ANGIOGRAM N/A 10/31/2014   Procedure: LEFT HEART CATHETERIZATION WITH CORONARY ANGIOGRAM;  Surgeon: Burnell Blanks, MD;  Location: [facility] LAB;  Service: Cardiovascular;  Laterality: N/A;  . LOWER EXTREMITY ANGIOGRAM N/A 09/10/2013   Procedure: LOWER EXTREMITY ANGIOGRAM;  Surgeon: [name], MD;  Location: [facility] CATH LAB;  Service: Cardiovascular;  Laterality: N/A;  . LOWER EXTREMITY ANGIOGRAM N/A 01/15/2015   Procedure: LOWER EXTREMITY ANGIOGRAM;  Surgeon: [name], MD;  Location: [facility] CATH LAB;  Service: Cardiovascular;  Laterality: N/A;  . LOWER EXTREMITY ANGIOGRAPHY N/A 04/13/2017   Procedure: Lower Extremity Angiography;  Surgeon: [name], MD;  Location: [facility];  Service: Cardiovascular;  Laterality: N/A;  . LOWER EXTREMITY ANGIOGRAPHY Left 06/26/2019   Procedure: LOWER EXTREMITY ANGIOGRAPHY;  Surgeon: Wellington Hampshire, MD;  Location: [facility];  Service: Cardiovascular;  Laterality: Left;  . PERIPHERAL VASCULAR BALLOON ANGIOPLASTY Left 06/26/2019   Procedure: PERIPHERAL VASCULAR BALLOON ANGIOPLASTY;  Surgeon: Wellington Hampshire, MD;  Location: [facility];  Service: Cardiovascular;  Laterality: Left;  unable to  cross lt sfa occlusion  . PERIPHERAL VASCULAR INTERVENTION  04/13/2017   Procedure: Peripheral Vascular Intervention;  Surgeon: [name], MD;  Location: [facility];  Service: Cardiovascular;;  . VEIN HARVEST Left 06/28/2019   Procedure: LEFT LEG GREATER SAPHENOUS VEIN HARVEST;  Surgeon: Rosetta Posner, MD;  Location: [place] [facility];  Service: Vascular;  Laterality: Left;     Allergies  Allergies  Allergen Reactions  . Gabapentin Nausea And Vomiting and Other (See Comments)    POSSIBLE SHAKING? TAKING MED CURRENTLY  . Lyrica [Pregabalin] Other (See Comments)    Shaking Pt still taking lyrica--"probably will stop taking"     History of Present Illness    59 year old female with the above complex past medical history including small vessel coronary artery disease, chronic combined systolic and diastolic ingestive heart failure with an EF of 50%, peripheral arterial disease status post left femoral to left anterior tibial bypass in August 2020, carotid arterial disease status post prior left carotid endarterectomy, hypertension, hyperlipidemia, diabetes, alcohol abuse with alcoholic  cirrhosis, polysubstance abuse, hypothyroidism, paroxysmal atrial fibrillation, stage III chronic kidney disease, and sleep apnea.  She previously underwent cardiac evaluation in the setting of heart failure and moderate LV dysfunction with an EF of 40 to 45% by echocardiogram in December 2015.  Diagnostic catheterization at that time showed an 80% OM1 (1.5 mm vessel) stenosis and a 90% nondominant mid RCA stenosis.  She otherwise had moderate, nonobstructive CAD.  In the setting of small vessel disease, recommendation was made for medical therapy.  EF improved to 45-50% over the years.  Peripheral vascular history dates back to March 2016, when she underwent right SFA atherectomy.  She was also noted to have severe carotid arterial disease and underwent left internal carotid endarterectomy in April 2016.  She  required repeat right SFA intervention in May 2018 and then in August 2020, she was noted to have an ischemic left lower leg and underwent left femoral to AT bypass.  She noticed steady weight gain and edema for at least a week prior to her hospitalization, without a clear etiology.  She reports eating a low-sodium diet and taking all her medications as prescribed, including her diuretic.  She denies angina, fever or chills, but she has a chronic cough and wheezing.  She denies use of cocaine or other illicit drugs.  She continues to smoke.    As the volume retention worsened she began experiencing orthopnea, PND and hemoptysis.  When she arrived to the hospital she was in respiratory distress and temporarily required BiPAP.  She has been treated with diuretics with excellent urine output, resolution of orthopnea, improvement in cough and hemoptysis, improving renal parameters.  She still has a lot of lower extremity edema.  She denies angina at any point during this sequence of events.  She has not had dizziness, syncope, palpitations, focal neurological events, overt bleeding problems.  She has chronic claudication that has not changed.  In fact she is quite sedentary and claudication rarely bothers her.  Echocardiogram showed restrictive filling pattern, even worse compared with the previous study from 2019 that showed pseudonormal filling, but overall LVEF was unchanged.  She has pre-existing anterolateral hypokinesis (this was seen on the echo from 2019 although it was not reported).  BNP was markedly elevated at 938.  I think her current BNP baseline is 240-290, although she had a very normal BNP of 47 in early 2018.  Chest x-ray consistent with CHF exacerbation but does not show overt pulmonary edema infiltrates.  Inpatient Medications    . amiodarone  100 mg Oral Daily  . apixaban  5 mg Oral BID  . atorvastatin  40 mg Oral q1800  . budesonide (PULMICORT) nebulizer solution  0.25 mg  Nebulization BID  . clopidogrel  75 mg Oral Daily  . dextromethorphan-guaiFENesin  1 tablet Oral BID  . folic acid  1 mg Oral Daily  . furosemide  40 mg Intravenous BID  . insulin aspart  0-15 Units Subcutaneous TID WC  . insulin aspart  0-5 Units Subcutaneous QHS  . insulin aspart  12 Units Subcutaneous TID WC  . insulin glargine  34 Units Subcutaneous QHS  . ipratropium-albuterol  3 mL Nebulization TID  . levothyroxine  50 mcg Oral Daily  . methylPREDNISolone (SOLU-MEDROL) injection  60 mg Intravenous Q8H  . multivitamin with minerals  1 tablet Oral Daily  . nicotine  21 mg Transdermal Daily  . nortriptyline  20 mg Oral QHS  . pantoprazole  40 mg Oral Daily  . sodium  chloride flush  3 mL Intravenous Q12H  . thiamine  100 mg Oral Daily    Family History    Family History  Problem Relation Age of Onset  . Hypertension Mother   . Diabetes Mother   . Cancer Mother        breast, ovarian, colon  . Clotting disorder Mother   . Heart disease Mother   . Heart attack Mother   . Breast cancer Mother        in 57's  . Hypertension Father   . Heart disease Father   . Emphysema Sister        smoked   She indicated that her mother is deceased. She indicated that her father is deceased. She indicated that the status of her sister is unknown.   Social History    Social History   Socioeconomic History  . Marital status: Divorced    Spouse name: Not on file  . Number of children: 1  . Years of education: 31  . Highest education level: 12th grade  Occupational History  . Occupation: disabled  Tobacco Use  . Smoking status: Current Some Day Smoker    Packs/day: 1.00    Years: 44.00    Pack years: 44.00    Types: E-cigarettes, Cigarettes  . Smokeless tobacco: Former Systems developer    Types: Snuff  Substance and Sexual Activity  . Alcohol use: Yes    Comment: 11/26/2018 "couple drinks/month maybe"  . Drug use: Yes    Types: "Crack" cocaine, Marijuana, Oxycodone    Comment: last  used 3 days ago   . Sexual activity: Not Currently  Other Topics Concern  . Not on file  Social History Narrative   Lives in [place], in [place] with sister.  They are looking to move but don't have a place to go yet.     Social Determinants of Health   Financial Resource Strain:   . Difficulty of Paying Living Expenses: Not on file  Food Insecurity:   . Worried About Charity fundraiser in the Last Year: Not on file  . Ran Out of Food in the Last Year: Not on file  Transportation Needs:   . Lack of Transportation (Medical): Not on file  . Lack of Transportation (Non-Medical): Not on file  Physical Activity:   . Days of Exercise per Week: Not on file  . Minutes of Exercise per Session: Not on file  Stress:   . Feeling of Stress : Not on file  Social Connections:   . Frequency of Communication with Friends and Family: Not on file  . Frequency of Social Gatherings with Friends and Family: Not on file  . Attends Religious Services: Not on file  . Active Member of Clubs or Organizations: Not on file  . Attends Archivist Meetings: Not on file  . Marital Status: Not on file  Intimate Partner Violence:   . Fear of Current or Ex-Partner: Not on file  . Emotionally Abused: Not on file  . Physically Abused: Not on file  . Sexually Abused: Not on file     Review of Systems    General:  No chills, fever, night sweats or weight changes.  Cardiovascular:  No chest pain, dyspnea on exertion, edema, orthopnea, palpitations, paroxysmal nocturnal dyspnea. Dermatological: No rash, lesions/masses Respiratory: No cough, dyspnea Urologic: No hematuria, dysuria Abdominal:   No nausea, vomiting, diarrhea, bright red blood per rectum, melena, or hematemesis Neurologic:  No visual changes, wkns, changes  in mental status. All other systems reviewed and are otherwise negative except as noted above.  Physical Exam    Blood pressure (!) 153/80, pulse 94, temperature 97.7 F (36.5 C),  temperature source Oral, resp. rate 18, height 5\' 4"  (1.626 m), weight (!) 146.3 kg, last menstrual period 11/27/2012, SpO2 94 %.  General: Pleasant, NAD Psych: Normal affect. Neuro: Alert and oriented X 3. Moves all extremities spontaneously. HEENT: Normal  Neck: Supple without bruits or JVD. Lungs:  Resp regular and unlabored, CTA. Heart: RRR no s3, s4, or murmurs. Abdomen: Soft, non-tender, non-distended, BS + x 4.  Extremities: No clubbing, cyanosis; 2+ symmetrical hard pitting pretibialedema. DP/PT/Radials 1+ and equal bilaterally. S/p digit/ray amputation R foot  Labs    Cardiac Enzymes Recent Labs  Lab 12/01/19 1837 12/01/19 2031  TROPONINIHS 22* 18*      Lab Results  Component Value Date   WBC 8.0 12/01/2019   HGB 9.5 (L) 12/01/2019   HCT 28.0 (L) 12/01/2019   MCV 94.7 12/01/2019   PLT 299 12/01/2019    Recent Labs  Lab 12/03/19 1320 12/04/19 0340 12/04/19 1218  NA 135   < > 135  K 4.8   < > 4.9  CL 99   < > 96*  CO2 26   < > 27  BUN 63*   < > 66*  CREATININE 1.75*   < > 1.57*  CALCIUM 8.5*   < > 8.8*  PROT 6.7  --   --   BILITOT 0.5  --   --   ALKPHOS 439*  --   --   ALT 66*  --   --   AST 29  --   --   GLUCOSE 342*   < > 477*   < > = values in this interval not displayed.   Lab Results  Component Value Date   CHOL 146 03/27/2018   HDL 39 (L) 03/27/2018   LDLCALC 63 03/27/2018   TRIG 222 (H) 03/27/2018   Lab Results  Component Value Date   DDIMER 0.48 10/29/2014     Radiology Studies    DG Chest Port 1 View  Result Date: 12/01/2019 CLINICAL DATA:  Lethargy EXAM: PORTABLE CHEST 1 VIEW COMPARISON:  February 19, 2019 FINDINGS: There are hazy bilateral airspace opacities, greatest at the lung bases. There are slightly prominent interstitial lung markings. There appear to be small bilateral pleural effusions, right greater than left. The heart size is enlarged. There is no pneumothorax. IMPRESSION: Overall findings concerning for congestive heart  failure. A superimposed atypical infectious process is difficult to exclude. Electronically Signed   By: Constance Holster M.D.   On: 12/01/2019 20:59    ECG & Cardiac Imaging    Sinus rhythm, old anteroseptal infarction, no acute repolarization abnormalities- personally reviewed.  Echocardiogram 12/02/2019 1. Left ventricular ejection fraction, by visual estimation, is 50%. The left ventricle has mildly decreased function. There is mildly increased left ventricular hypertrophy.  2. Left ventricular diastolic parameters are consistent with Grade III diastolic dysfunction (restrictive).  3. Mildly dilated left ventricular internal cavity size.  4. The left ventricle demonstrates regional wall motion abnormalities. Anterolateral hypokinesis.  5. Global right ventricle has normal systolic function.The right ventricular size is normal. No increase in right ventricular wall thickness.  6. Left atrial size was mildly dilated.  7. Right atrial size was mildly dilated.  8. Mild mitral annular calcification.  9. The mitral valve is normal in structure. Mild mitral valve regurgitation. No  evidence of mitral stenosis. 10. The tricuspid valve is normal in structure. 11. The aortic valve is tricuspid. Aortic valve regurgitation is not visualized. Mild aortic valve sclerosis without stenosis. 12. The inferior vena cava is dilated in size with <50% respiratory variability, suggesting right atrial pressure of 15 mmHg. 13. The tricuspid regurgitant velocity is 3.35 m/s, and with an assumed right atrial pressure of 15 mmHg, the estimated right ventricular systolic pressure is moderately elevated at 59.9 mmHg.  I reviewed her echo side-by-side over the previous study.  Left ventricular systolic function and wall motion pattern is not changed, but there is now evidence of severe increase in both left atrial and right atrial filling pressures and light worsening pulmonary hypertension.  Assessment & Plan      1.  Acute on chronic combined systolic and diastolic heart failure: She still has evidence of marked hypervolemia by physical exam, but is improving with current diuretic therapy.  The exact cause of exacerbation is unclear, but she had a protracted period of volume/weight gain consistent with volume overload rather than a change in her cardiac performance.  Discussed the fact that she should respond to noticeable weight gain (more than 3 pounds in 1 day or more than 5 pounds in 1 week) calling her physician to assist with treatment for heart failure exacerbation before she requires hospital/ED visits.  She is doing her best with sodium restriction and does appear to be compliant with her medications. 2.  Right heart failure: She also has evidence of moderate to severe pulmonary artery hypertension that may be do not just to diastolic left heart failure, but also COPD and obesity.  She is actively wheezing and has symptoms of chronic bronchitis.  She may have residual lower extremity edema even once we achieved optimal diuresis. 3.  Acute on chronic kidney disease stage 3b: Creatinine is steadily improving with diuretic therapy.  Seems to be very close to baseline creatinine which is probably around 1.4.  Continue diuretics. 4. Parox AFib: none on current admission. On very low dose amiodarone and apixaban. 5. PAD: including carotid, renal artery and lower extremity disease. 6. DM: severe hyperglycemia after receiving steroids for COPD. On insulin 7. PAH:  Likely due to both left heart failure and COPD-restrictive lung disease secondary to obesity.   Signed, Murray Hodgkins, NP 12/04/2019, 5:52 PM  For questions or updates, please contact   Please consult [email or web address] for contact info under Cardiology/STEMI.

## 2019-12-05 LAB — CBC WITH DIFFERENTIAL/PLATELET
Abs Immature Granulocytes: 0.09 10*3/uL — ABNORMAL HIGH (ref 0.00–0.07)
Basophils Absolute: 0 10*3/uL (ref 0.0–0.1)
Basophils Relative: 0 %
Eosinophils Absolute: 0 10*3/uL (ref 0.0–0.5)
Eosinophils Relative: 0 %
HCT: 31.5 % — ABNORMAL LOW (ref 36.0–46.0)
Hemoglobin: 9.4 g/dL — ABNORMAL LOW (ref 12.0–15.0)
Immature Granulocytes: 1 %
Lymphocytes Relative: 3 %
Lymphs Abs: 0.5 10*3/uL — ABNORMAL LOW (ref 0.7–4.0)
MCH: 27.3 pg (ref 26.0–34.0)
MCHC: 29.8 g/dL — ABNORMAL LOW (ref 30.0–36.0)
MCV: 91.6 fL (ref 80.0–100.0)
Monocytes Absolute: 0.4 10*3/uL (ref 0.1–1.0)
Monocytes Relative: 3 %
Neutro Abs: 12.7 10*3/uL — ABNORMAL HIGH (ref 1.7–7.7)
Neutrophils Relative %: 93 %
Platelets: 334 10*3/uL (ref 150–400)
RBC: 3.44 MIL/uL — ABNORMAL LOW (ref 3.87–5.11)
RDW: 15.7 % — ABNORMAL HIGH (ref 11.5–15.5)
WBC: 13.7 10*3/uL — ABNORMAL HIGH (ref 4.0–10.5)
nRBC: 0 % (ref 0.0–0.2)

## 2019-12-05 LAB — GLUCOSE, CAPILLARY
Glucose-Capillary: 308 mg/dL — ABNORMAL HIGH (ref 70–99)
Glucose-Capillary: 284 mg/dL — ABNORMAL HIGH (ref 70–99)
Glucose-Capillary: 304 mg/dL — ABNORMAL HIGH (ref 70–99)
Glucose-Capillary: 473 mg/dL — ABNORMAL HIGH (ref 70–99)

## 2019-12-05 LAB — BASIC METABOLIC PANEL
Anion gap: 13 (ref 5–15)
BUN: 59 mg/dL — ABNORMAL HIGH (ref 6–20)
CO2: 28 mmol/L (ref 22–32)
Calcium: 8.7 mg/dL — ABNORMAL LOW (ref 8.9–10.3)
Chloride: 99 mmol/L (ref 98–111)
Creatinine, Ser: 1.41 mg/dL — ABNORMAL HIGH (ref 0.44–1.00)
GFR calc Af Amer: 48 mL/min — ABNORMAL LOW (ref >60)
GFR calc non Af Amer: 41 mL/min — ABNORMAL LOW (ref >60)
Glucose, Bld: 262 mg/dL — ABNORMAL HIGH (ref 70–99)
Potassium: 4.9 mmol/L (ref 3.5–5.1)
Sodium: 140 mmol/L (ref 135–145)

## 2019-12-05 LAB — MAGNESIUM: Magnesium: 2.4 mg/dL (ref 1.7–2.4)

## 2019-12-05 MED ORDER — INSULIN ASPART 100 UNIT/ML ~~LOC~~ SOLN
10.0000 [IU] | Freq: Once | SUBCUTANEOUS | Status: AC
  Administered 2019-12-05: 23:00:00 10 [IU] via SUBCUTANEOUS

## 2019-12-05 MED ORDER — INSULIN ASPART 100 UNIT/ML ~~LOC~~ SOLN
15.0000 [IU] | Freq: Three times a day (TID) | SUBCUTANEOUS | Status: DC
  Administered 2019-12-05 – 2019-12-09 (×13): 15 [IU] via SUBCUTANEOUS

## 2019-12-05 MED ORDER — METHYLPREDNISOLONE SODIUM SUCC 125 MG IJ SOLR
60.0000 mg | Freq: Two times a day (BID) | INTRAMUSCULAR | Status: DC
  Administered 2019-12-06 – 2019-12-07 (×3): 60 mg via INTRAVENOUS
  Filled 2019-12-05 (×3): qty 2

## 2019-12-05 MED ORDER — IPRATROPIUM-ALBUTEROL 0.5-2.5 (3) MG/3ML IN SOLN
3.0000 mL | Freq: Three times a day (TID) | RESPIRATORY_TRACT | Status: DC
  Administered 2019-12-05 (×3): 3 mL via RESPIRATORY_TRACT
  Filled 2019-12-05: qty 3

## 2019-12-05 MED ORDER — BUDESONIDE 0.25 MG/2ML IN SUSP
0.2500 mg | Freq: Two times a day (BID) | RESPIRATORY_TRACT | Status: DC
  Administered 2019-12-05 – 2019-12-09 (×9): 0.25 mg via RESPIRATORY_TRACT
  Filled 2019-12-05 (×8): qty 2

## 2019-12-05 MED ORDER — SODIUM CHLORIDE 0.9 % IV SOLN
1.0000 g | INTRAVENOUS | Status: DC
  Administered 2019-12-05 – 2019-12-06 (×2): 1 g via INTRAVENOUS
  Filled 2019-12-05 (×2): qty 10

## 2019-12-05 MED ORDER — INSULIN GLARGINE 100 UNIT/ML ~~LOC~~ SOLN
40.0000 [IU] | Freq: Every day | SUBCUTANEOUS | Status: DC
  Administered 2019-12-05 – 2019-12-06 (×2): 40 [IU] via SUBCUTANEOUS
  Filled 2019-12-05 (×3): qty 0.4

## 2019-12-05 MED ORDER — DOXYCYCLINE HYCLATE 100 MG PO TABS
100.0000 mg | ORAL_TABLET | Freq: Two times a day (BID) | ORAL | Status: DC
  Administered 2019-12-05: 10:00:00 100 mg via ORAL
  Filled 2019-12-05: qty 1

## 2019-12-05 MED ORDER — INSULIN GLARGINE 100 UNIT/ML ~~LOC~~ SOLN
36.0000 [IU] | Freq: Every day | SUBCUTANEOUS | Status: DC
  Filled 2019-12-05: qty 0.36

## 2019-12-05 NOTE — Progress Notes (Signed)
Pt. CBG 473. On call for [facility] paged to make aware.

## 2019-12-05 NOTE — Progress Notes (Addendum)
Inpatient Diabetes Program Recommendations  AACE/ADA: New Consensus Statement on Inpatient Glycemic Control (2015)  Target Ranges:  Prepandial:   less than 140 mg/dL      Peak postprandial:   less than 180 mg/dL (1-2 hours)      Critically ill patients:  140 - 180 mg/dL   Lab Results  Component Value Date   GLUCAP 308 (H) 12/05/2019   HGBA1C 7.3 (H) 12/02/2019    Review of Glycemic Control  Results for [name], [name] (MRN [id number]) as of 12/05/2019 11:27  Ref. Range 12/03/2019 23:28 12/04/2019 05:54 12/04/2019 11:39 12/04/2019 17:17 12/04/2019 21:48 12/05/2019 06:24  Glucose-Capillary Latest Ref Range: 70 - 99 mg/dL 371 (H) 446 (H) 502 (HH) 426 (H) 368 (H) 308 (H)    Diabetes history: DM2 Outpatient Diabetes medications:  Lantus 40 units daily + Humalog 15-20 TID  Current orders for Inpatient glycemic control:  Lantus 36 units daily + Novolog 0-15 TID + 0-5 QHS + Novolog 12 units TID with meals + Solumedrol 60 Q8H   Inpatient Diabetes Program Recommendations:     Noted that patient is on Lantus 40 at home QD.  Please consider   -Lantus 40 units daily  -Novolog 15 units TID with meals if eats at least 50% of meal  Thank you, Reche Dixon, RN, BSN Diabetes Coordinator Inpatient Diabetes Program [phone] (team pager from 8a-5p)

## 2019-12-05 NOTE — Plan of Care (Signed)
  Problem: Education: Goal: Knowledge of General Education information will improve Description: Including pain rating scale, medication(s)/side effects and non-pharmacologic comfort measures Outcome: Progressing   Problem: Health Behavior/Discharge Planning: Goal: Ability to manage health-related needs will improve Outcome: Progressing   Problem: Clinical Measurements: Goal: Ability to maintain clinical measurements within normal limits will improve Outcome: Progressing Goal: Will remain free from infection Outcome: Progressing Goal: Diagnostic test results will improve Outcome: Progressing Goal: Respiratory complications will improve Outcome: Progressing Goal: Cardiovascular complication will be avoided Outcome: Progressing   Problem: Activity: Goal: Risk for activity intolerance will decrease Outcome: Progressing   Problem: Elimination: Goal: Will not experience complications related to bowel motility Outcome: Progressing Goal: Will not experience complications related to urinary retention Outcome: Progressing   Problem: Pain Managment: Goal: General experience of comfort will improve Outcome: Progressing   Problem: Safety: Goal: Ability to remain free from injury will improve Outcome: Progressing   Problem: Skin Integrity: Goal: Risk for impaired skin integrity will decrease Outcome: Progressing   Problem: Education: Goal: Ability to demonstrate management of disease process will improve Outcome: Progressing Goal: Ability to verbalize understanding of medication therapies will improve Outcome: Progressing Goal: Individualized Educational Video(s) Outcome: Progressing   Problem: Activity: Goal: Capacity to carry out activities will improve Outcome: Progressing   Problem: Cardiac: Goal: Ability to achieve and maintain adequate cardiopulmonary perfusion will improve Outcome: Progressing   Problem: Education: Goal: Knowledge of disease or condition will  improve Outcome: Progressing Goal: Knowledge of the prescribed therapeutic regimen will improve Outcome: Progressing Goal: Individualized Educational Video(s) Outcome: Progressing   Problem: Activity: Goal: Ability to tolerate increased activity will improve Outcome: Progressing Goal: Will verbalize the importance of balancing activity with adequate rest periods Outcome: Progressing   Problem: Respiratory: Goal: Ability to maintain a clear airway will improve Outcome: Progressing Goal: Levels of oxygenation will improve Outcome: Progressing Goal: Ability to maintain adequate ventilation will improve Outcome: Progressing   Problem: Activity: Goal: Capacity to carry out activities will improve Outcome: Progressing   Problem: Respiratory: Goal: Ability to maintain a clear airway will improve Outcome: Progressing

## 2019-12-05 NOTE — Progress Notes (Signed)
patient currently sitting in chair receiving a breathing treatment.  sleeps in chair at night, up and down to bedside commode.

## 2019-12-05 NOTE — Progress Notes (Signed)
PROGRESS NOTE    [name]  TOI:[id number] DOB: 1961/06/14 DOA: 12/01/2019 PCP: Tsosie Billing, MD (Inactive)    Brief Narrative:  HPI per Dr. Annamary [name] [name] is a 59 y.o. female with medical history significant of alcoholic liver cirrhosis, CAD, carotid artery disease, chronic combined systolic and diastolic congestive heart failure, COPD, PAD, hypertension, hyperlipidemia, hypothyroidism, paroxysmal atrial fibrillation, type 2 diabetes, OSA not on CPAP, cocaine use/polysubstance abuse presenting to the ED via EMS for evaluation of shortness of breath.  Family called EMS because patient has been getting more lethargic over the last several days and having labored breathing. Patient reports 3-day history of dyspnea and orthopnea.  She is coughing up clear sputum.  Her legs are swollen.  States she takes her home medications but is not able to tell me which medication she takes.  States she uses 1 inhaler for her COPD.  Denies chest pain.  Denies fevers, chills, nausea, vomiting, abdominal pain, or diarrhea.  No other complaints.  ED Course: Afebrile and hemodynamically stable.  Not hypoxic.  Patient appeared to be in respiratory distress on arrival to the ED.  She was wheezing.  Labs showing no leukocytosis.  Hemoglobin 8.5, at baseline.  BNP elevated at 938.  Creatinine 1.8, baseline 1.2-1.3.  AST 60, ALT 83, alk phos 438, T bili 0.7.  High-sensitivity troponin 22.  It is reported in the chart that initial ABG showing pH 7.45, PCO2 37, and PO2 37.  I spoke to respiratory and they confirm that this is an error, this was a VBG.  Chest x-ray showing hazy bilateral airspace opacities and small bilateral pleural effusions concerning for volume overload.  SARS-CoV-2 PCR test negative. Received albuterol continuous neb treatment, IV Solu-Medrol 125 mg, and IV Lasix 40 mg.  Assessment & Plan:   Principal Problem:   Acute exacerbation of CHF (congestive heart failure) ([name]) Active  Problems:   DM (diabetes mellitus), type 2 with peripheral vascular complications ([name])   COPD with acute exacerbation ([name])   AKI (acute kidney injury) ([place])   Acute respiratory failure with hypoxia ([name])   Acute renal failure ([place])   1 acute hypoxemic respiratory failure secondary to acute on chronic diastolic CHF and acute COPD exacerbation Patient presented with worsening shortness of breath and orthopnea.  Respiratory viral panel was negative.  SARS covid virus 2 PCR negative.  Chest x-ray concerning for volume overload with interstitial lung markings, bilateral pleural effusion concerning for congestive heart failure.  Patient on IV Lasix with urine output of 1.7 L over the past 24 hours.  Patient is -3.5 L during this hospitalization.  2D echo done with EF of 83%, grade 3 diastolic dysfunction, left ventricular anterior lateral hypokinesis/regional wall motion abnormalities.  Current weight of 232.4 pounds from 322.5 pounds from 228.84 pounds on admission.  Continue Lasix 40 mg IV every 12 hours, Plavix, IV steroid taper, PPI, Pulmicort nebs, Mucinex, duo nebs.  2.  Acute on chronic systolic and diastolic CHF exacerbation Patient presented with dyspnea and orthopnea.  Chest x-ray consistent with volume overload and bilateral pleural effusions.  2D echo done with EF of 38%, grade 3 diastolic dysfunction, left ventricular wall motion abnormalities.  Patient improving clinically daily.  Patient with a urine output of 1.7 L over the past 24 hours.  Patient is - 3.505 L during this hospitalization.  Current weight of 232.4 pounds from 322.5 pounds from 228.84 pounds on admission.  Weight likely not properly recorded.  Continue IV  Lasix.  Continue Lipitor.  Hold off on beta-blocker due to acute decompensation.  Cardiology consulted and has reassessed 2D echo and feels no significant change from previous study with evidence of increased left atrial and right atrial filling pressures with slight worsening  of pulmonary hypertension.  Continue current treatment per cardiology.   3.  Acute kidney injury on chronic kidney disease stage IIIb Baseline creatinine 1.2-1.4.  Creatinine slowly trending down with diuresis and currently at 1.41.[name]  Follow.  4.  Elevated troponin Patient noted to have a slightly elevated troponin that was trending down felt secondary to demand ischemia secondary to problem #1.  2D echo however with wall motion abnormalities, grade 3 diastolic dysfunction, EF of 50%.  5.  Insulin-dependent diabetes mellitus Hemoglobin A1c of 7.3.  CBG of 308.  Elevated CBG likely secondary to steroids.  Increase Lantus to 40 units daily.  Increase meal coverage insulin to 15 units 3 times daily.  Sliding scale insulin.  6.  Hypothyroidism Synthroid.   7.  Paroxysmal A. fib In sinus rhythm.  Continue amiodarone.  Eliquis for anticoagulation.   8.  Hyperlipidemia Continue statin.  9.  Coronary artery disease Plavix.   10.  History of alcohol abuse Stable.  No signs of withdrawal.  Continue Ativan withdrawal protocol.  11.  Tobacco abuse Tobacco cessation stressed to patient.  Nicotine patch.  12.  Klebsiella pneumonia UTI. Patient started on doxycycline which we will discontinue and placed on IV Rocephin.     DVT prophylaxis: Eliquis Code Status: DNR Family Communication: Updated patient.  No family at bedside. Disposition Plan: To be determined.  Likely home when clinically improved.   Consultants:   None  Procedures:   Chest x-ray 12/01/2019  2D echo 12/02/2019    Antimicrobials:   Doxycycline 12/05/2019>>> 12/05/2019  IV Rocephin 12/05/2019   Subjective: Patient sitting up in bed.  States some shortness of breath improving.  Patient still with some wheezing.   Objective: Vitals:   12/05/19 0307 12/05/19 0308 12/05/19 0752 12/05/19 0819  BP:    (!) 152/80  Pulse: 85  90 88  Resp: '20 20 20 18  ' Temp:  97.6 F (36.4 C)  97.9 F (36.6 C)  TempSrc:  Oral   Oral  SpO2: 94%  100% 100%  Weight:      Height:        Intake/Output Summary (Last 24 hours) at 12/05/2019 1028 Last data filed at 12/05/2019 0955 Gross per 24 hour  Intake 960 ml  Output 2301 ml  Net -1341 ml   Filed Weights   12/03/19 0534 12/04/19 0323 12/05/19 0054  Weight: 103 kg (!) 146.3 kg 105.4 kg    Examination:  General exam: NAD. Respiratory system: Minimal inspiratory and expiratory wheezing.  Decreased breath sounds in the bases.  Cardiovascular system: Regular rate rhythm.  No murmurs rubs or gallops.  1+ bilateral lower extremity edema. Gastrointestinal system: Abdomen is soft, nontender, nondistended, positive bowel sounds.  No rebound.  No guarding.  Central nervous system: Alert and oriented. No focal neurological deficits. Extremities: Status post right transmetatarsal amputation.  Symmetric 5 x 5 power. Skin: no rashes, lesions or ulcers Psychiatry: Judgement and insight appear normal. Mood & affect appropriate.     Data Reviewed: I have personally reviewed following labs and imaging studies  CBC: Recent Labs  Lab 12/01/19 1837 12/01/19 1856 12/01/19 2225 12/05/19 0454  WBC 8.0  --   --  13.7*  NEUTROABS 6.0  --   --  12.7*  HGB 8.5* 7.8* 9.5* 9.4*  HCT 28.4* 23.0* 28.0* 31.5*  MCV 94.7  --   --  91.6  PLT 299  --   --  021   Basic Metabolic Panel: Recent Labs  Lab 12/02/19 0405 12/02/19 0405 12/03/19 1320 12/04/19 0340 12/04/19 1218 12/04/19 1824 12/05/19 0454  NA 143  --  135 135 135  --  140  K 4.4  --  4.8 4.9 4.9  --  4.9  CL 107  --  99 97* 96*  --  99  CO2 21*  --  '26 26 27  ' --  28  GLUCOSE 203*   < > 342* 433* 477* 467* 262*  BUN 44*  --  63* 69* 66*  --  59*  CREATININE 1.87*  --  1.75* 1.64* 1.57*  --  1.41*  CALCIUM 8.4*  --  8.5* 8.7* 8.8*  --  8.7*  MG  --   --   --   --   --   --  2.4   < > = values in this interval not displayed.   GFR: Estimated Creatinine Clearance: 52.1 mL/min (A) (by C-G formula based on SCr  of 1.41 mg/dL (H)). Liver Function Tests: Recent Labs  Lab 12/01/19 1837 12/02/19 0405 12/03/19 1320  AST 60* 54* 29  ALT 83* 83* 66*  ALKPHOS 438* 434* 439*  BILITOT 0.7 0.7 0.5  PROT 6.7 6.8 6.7  ALBUMIN 2.7* 2.8* 2.8*   No results for input(s): LIPASE, AMYLASE in the last 168 hours. No results for input(s): AMMONIA in the last 168 hours. Coagulation Profile: No results for input(s): INR, PROTIME in the last 168 hours. Cardiac Enzymes: No results for input(s): CKTOTAL, CKMB, CKMBINDEX, TROPONINI in the last 168 hours. BNP (last 3 results) No results for input(s): PROBNP in the last 8760 hours. HbA1C: No results for input(s): HGBA1C in the last 72 hours. CBG: Recent Labs  Lab 12/04/19 0554 12/04/19 1139 12/04/19 1717 12/04/19 2148 12/05/19 0624  GLUCAP 446* 502* 426* 368* 308*   Lipid Profile: No results for input(s): CHOL, HDL, LDLCALC, TRIG, CHOLHDL, LDLDIRECT in the last 72 hours. Thyroid Function Tests: No results for input(s): TSH, T4TOTAL, FREET4, T3FREE, THYROIDAB in the last 72 hours. Anemia Panel: No results for input(s): VITAMINB12, FOLATE, FERRITIN, TIBC, IRON, RETICCTPCT in the last 72 hours. Sepsis Labs: Recent Labs  Lab 12/02/19 0405  PROCALCITON <0.10    Recent Results (from the past 240 hour(s))  Respiratory Panel by RT PCR (Flu A&B, Covid) - Nasopharyngeal Swab     Status: None   Collection Time: 12/01/19  6:37 PM   Specimen: Nasopharyngeal Swab  Result Value Ref Range Status   SARS Coronavirus 2 by RT PCR NEGATIVE NEGATIVE Final    Comment: (NOTE) SARS-CoV-2 target nucleic acids are NOT DETECTED. The SARS-CoV-2 RNA is generally detectable in upper respiratoy specimens during the acute phase of infection. The lowest concentration of SARS-CoV-2 viral copies this assay can detect is 131 copies/mL. A negative result does not preclude SARS-Cov-2 infection and should not be used as the sole basis for treatment or other patient management  decisions. A negative result may occur with  improper specimen collection/handling, submission of specimen other than nasopharyngeal swab, presence of viral mutation(s) within the areas targeted by this assay, and inadequate number of viral copies (<131 copies/mL). A negative result must be combined with clinical observations, patient history, and epidemiological information. The expected result is Negative. Fact Sheet for Patients:  PinkCheek.be Fact Sheet for Healthcare Providers:  GravelBags.it This test is not yet ap proved or cleared by the [place] FDA and  has been authorized for detection and/or diagnosis of SARS-CoV-2 by FDA under an Emergency Use Authorization (EUA). This EUA will remain  in effect (meaning this test can be used) for the duration of the COVID-19 declaration under Section 564(b)(1) of the Act, 21 U.S.C. section 360bbb-3(b)(1), unless the authorization is terminated or revoked sooner.    Influenza A by PCR NEGATIVE NEGATIVE Final   Influenza B by PCR NEGATIVE NEGATIVE Final    Comment: (NOTE) The Xpert Xpress SARS-CoV-2/FLU/RSV assay is intended as an aid in  the diagnosis of influenza from Nasopharyngeal swab specimens and  should not be used as a sole basis for treatment. Nasal washings and  aspirates are unacceptable for Xpert Xpress SARS-CoV-2/FLU/RSV  testing. Fact Sheet for Patients: PinkCheek.be Fact Sheet for Healthcare Providers: GravelBags.it This test is not yet approved or cleared by the [place] FDA and  has been authorized for detection and/or diagnosis of SARS-CoV-2 by  FDA under an Emergency Use Authorization (EUA). This EUA will remain  in effect (meaning this test can be used) for the duration of the  Covid-19 declaration under Section 564(b)(1) of the Act, 21  U.S.C. section 360bbb-3(b)(1), unless the authorization  is  terminated or revoked. Performed at [facility], [place] [address] [place], [place] [address]   Urine culture     Status: Abnormal   Collection Time: 12/02/19 12:41 AM   Specimen: Urine, Clean Catch  Result Value Ref Range Status   Specimen Description URINE, CLEAN CATCH  Final   Special Requests   Final    NONE Performed at [facility], [place] [address], [place], [place] [address]    Culture >=100,000 COLONIES/mL KLEBSIELLA PNEUMONIAE (A)  Final   Report Status 12/04/2019 FINAL  Final   Organism ID, Bacteria KLEBSIELLA PNEUMONIAE (A)  Final      Susceptibility   Klebsiella pneumoniae - MIC*    AMPICILLIN RESISTANT Resistant     CEFAZOLIN <=4 SENSITIVE Sensitive     CEFTRIAXONE <=0.25 SENSITIVE Sensitive     CIPROFLOXACIN <=0.25 SENSITIVE Sensitive     GENTAMICIN <=1 SENSITIVE Sensitive     IMIPENEM <=0.25 SENSITIVE Sensitive     NITROFURANTOIN 128 RESISTANT Resistant     TRIMETH/SULFA <=20 SENSITIVE Sensitive     AMPICILLIN/SULBACTAM <=2 SENSITIVE Sensitive     PIP/TAZO <=4 SENSITIVE Sensitive     * >=100,000 COLONIES/mL KLEBSIELLA PNEUMONIAE  MRSA PCR Screening     Status: None   Collection Time: 12/02/19  4:05 AM   Specimen: Nasal Mucosa; Nasopharyngeal  Result Value Ref Range Status   MRSA by PCR NEGATIVE NEGATIVE Final    Comment:        The GeneXpert MRSA Assay (FDA approved for NASAL specimens only), is one component of a comprehensive MRSA colonization surveillance program. It is not intended to diagnose MRSA infection nor to guide or monitor treatment for MRSA infections. Performed at [facility], [place] [address] [place], [place] [address]          Radiology Studies: No results found.      Scheduled Meds: . amiodarone  100 mg Oral Daily  . apixaban  5 mg Oral BID  . atorvastatin  40 mg Oral q1800  . budesonide (PULMICORT) nebulizer solution  0.25 mg Nebulization BID  . clopidogrel  75 mg Oral Daily  .  dextromethorphan-guaiFENesin  1 tablet Oral BID  . doxycycline  100 mg Oral Q12H  . folic acid  1 mg Oral Daily  . furosemide  40 mg Intravenous BID  . insulin aspart  0-15 Units Subcutaneous TID WC  . insulin aspart  0-5 Units Subcutaneous QHS  . insulin aspart  12 Units Subcutaneous TID WC  . insulin glargine  36 Units Subcutaneous QHS  . ipratropium-albuterol  3 mL Nebulization TID  . levothyroxine  50 mcg Oral Daily  . methylPREDNISolone (SOLU-MEDROL) injection  60 mg Intravenous Q8H  . multivitamin with minerals  1 tablet Oral Daily  . nicotine  21 mg Transdermal Daily  . nortriptyline  20 mg Oral QHS  . pantoprazole  40 mg Oral Daily  . sodium chloride flush  3 mL Intravenous Q12H  . thiamine  100 mg Oral Daily   Continuous Infusions: . sodium chloride       LOS: 4 days    Time spent: 35 minutes    Irine Seal, MD [facility]  If 7PM-7AM, please contact night-coverage [email or web address] 12/05/2019, 10:28 AM

## 2019-12-05 NOTE — Progress Notes (Signed)
Progress Note  Patient Name: [name] Date of Encounter: 12/05/2019  Primary Cardiologist: Quay Burow, MD   Subjective   Little change since last night. Sleeps upright in recliner (habitually). Weight is clearly not reliable, but net negative fluid balance and further improvement in renal function (now probably at baseline).  Inpatient Medications    Scheduled Meds: . amiodarone  100 mg Oral Daily  . apixaban  5 mg Oral BID  . atorvastatin  40 mg Oral q1800  . budesonide (PULMICORT) nebulizer solution  0.25 mg Nebulization BID  . clopidogrel  75 mg Oral Daily  . dextromethorphan-guaiFENesin  1 tablet Oral BID  . doxycycline  100 mg Oral Q12H  . folic acid  1 mg Oral Daily  . furosemide  40 mg Intravenous BID  . insulin aspart  0-15 Units Subcutaneous TID WC  . insulin aspart  0-5 Units Subcutaneous QHS  . insulin aspart  12 Units Subcutaneous TID WC  . insulin glargine  36 Units Subcutaneous QHS  . ipratropium-albuterol  3 mL Nebulization TID  . levothyroxine  50 mcg Oral Daily  . methylPREDNISolone (SOLU-MEDROL) injection  60 mg Intravenous Q8H  . multivitamin with minerals  1 tablet Oral Daily  . nicotine  21 mg Transdermal Daily  . nortriptyline  20 mg Oral QHS  . pantoprazole  40 mg Oral Daily  . sodium chloride flush  3 mL Intravenous Q12H  . thiamine  100 mg Oral Daily   Continuous Infusions: . sodium chloride     PRN Meds: sodium chloride, acetaminophen, albuterol, ipratropium-albuterol, polyethylene glycol, sodium chloride flush   Vital Signs    Vitals:   12/05/19 0307 12/05/19 0308 12/05/19 0752 12/05/19 0819  BP:    (!) 152/80  Pulse: 85  90 88  Resp: 20 20 20 18   Temp:  97.6 F (36.4 C)  97.9 F (36.6 C)  TempSrc:  Oral  Oral  SpO2: 94%  100% 100%  Weight:      Height:        Intake/Output Summary (Last 24 hours) at 12/05/2019 0850 Last data filed at 12/05/2019 0315 Gross per 24 hour  Intake 1040 ml  Output 1701 ml  Net -661 ml    Last 3 Weights 12/05/2019 12/04/2019 12/03/2019  Weight (lbs) 232 lb 6.4 oz 322 lb 8 oz 227 lb 1.2 oz  Weight (kg) 105.416 kg 146.285 kg 103 kg      Telemetry    NSR - Personally Reviewed  ECG    No new tracing - Personally Reviewed  Physical Exam  Morbidly obese GEN: No acute distress.   Neck: hard to see JVD Cardiac: RRR, no murmurs, rubs, or gallops.  Respiratory: bilateral wheezing, no wet rales GI: Soft, nontender, non-distended  MS: 2+ hard pitting bilateral pretibial edema; No deformity. Neuro:  Nonfocal  Psych: Normal affect   Labs    High Sensitivity Troponin:   Recent Labs  Lab 12/01/19 1837 12/01/19 2031  TROPONINIHS 22* 18*      Chemistry Recent Labs  Lab 12/01/19 1837 12/01/19 1856 12/02/19 0405 12/02/19 0405 12/03/19 1320 12/03/19 1320 12/04/19 0340 12/04/19 0340 12/04/19 1218 12/04/19 1824 12/05/19 0454  NA 142   < > 143   < > 135   < > 135  --  135  --  140  K 4.4   < > 4.4   < > 4.8   < > 4.9  --  4.9  --  4.9  CL  108   < > 107   < > 99   < > 97*  --  96*  --  99  CO2 23   < > 21*   < > 26   < > 26  --  27  --  28  GLUCOSE 103*   < > 203*   < > 342*   < > 433*   < > 477* 467* 262*  BUN 43*   < > 44*   < > 63*   < > 69*  --  66*  --  59*  CREATININE 1.85*   < > 1.87*   < > 1.75*   < > 1.64*  --  1.57*  --  1.41*  CALCIUM 8.3*   < > 8.4*   < > 8.5*   < > 8.7*  --  8.8*  --  8.7*  PROT 6.7  --  6.8  --  6.7  --   --   --   --   --   --   ALBUMIN 2.7*  --  2.8*  --  2.8*  --   --   --   --   --   --   AST 60*  --  54*  --  29  --   --   --   --   --   --   ALT 83*  --  83*  --  66*  --   --   --   --   --   --   ALKPHOS 438*  --  434*  --  439*  --   --   --   --   --   --   BILITOT 0.7  --  0.7  --  0.5  --   --   --   --   --   --   GFRNONAA 30*   < > 29*   < > 32*   < > 34*  --  36*  --  41*  GFRAA 34*   < > 34*   < > 37*   < > 40*  --  42*  --  48*  ANIONGAP 11   < > 15   < > 10   < > 12  --  12  --  13   < > = values in this interval  not displayed.     Hematology Recent Labs  Lab 12/01/19 1837 12/01/19 1837 12/01/19 1856 12/01/19 2225 12/05/19 0454  WBC 8.0  --   --   --  13.7*  RBC 3.00*  --   --   --  3.44*  HGB 8.5*   < > 7.8* 9.5* 9.4*  HCT 28.4*   < > 23.0* 28.0* 31.5*  MCV 94.7  --   --   --  91.6  MCH 28.3  --   --   --  27.3  MCHC 29.9*  --   --   --  29.8*  RDW 16.7*  --   --   --  15.7*  PLT 299  --   --   --  334   < > = values in this interval not displayed.    BNP Recent Labs  Lab 12/01/19 1837  BNP 938.7*     DDimer No results for input(s): DDIMER in the last 168 hours.   Radiology    No results found.  Cardiac Studies  Echocardiogram 12/02/2019 1. Left ventricular ejection fraction, by visual estimation, is 50%. The left ventricle has mildly decreased function. There is mildly increased left ventricular hypertrophy. 2. Left ventricular diastolic parameters are consistent with Grade III diastolic dysfunction (restrictive). 3. Mildly dilated left ventricular internal cavity size. 4. The left ventricle demonstrates regional wall motion abnormalities. Anterolateral hypokinesis. 5. Global right ventricle has normal systolic function.The right ventricular size is normal. No increase in right ventricular wall thickness. 6. Left atrial size was mildly dilated. 7. Right atrial size was mildly dilated. 8. Mild mitral annular calcification. 9. The mitral valve is normal in structure. Mild mitral valve regurgitation. No evidence of mitral stenosis. 10. The tricuspid valve is normal in structure. 11. The aortic valve is tricuspid. Aortic valve regurgitation is not visualized. Mild aortic valve sclerosis without stenosis. 12. The inferior vena cava is dilated in size with <50% respiratory variability, suggesting right atrial pressure of 15 mmHg. 13. The tricuspid regurgitant velocity is 3.35 m/s, and with an assumed right atrial pressure of 15 mmHg, the estimated right ventricular  systolic pressure is moderately elevated at 59.9 mmHg.  I reviewed her echo side-by-side over the previous study.  Left ventricular systolic function and wall motion pattern is not changed, but there is now evidence of severe increase in both left atrial and right atrial filling pressures and light worsening pulmonary hypertension.   Patient Profile     59 y.o. female with a history of small vessel coronary artery disease, chronic combined systolic and diastolic congestive heart failure with an EF of 50%, peripheral arterial disease status post left femoral to left anterior tibial bypass in August 2020, carotid arterial disease status post left carotid endarterectomy, hypertension, hyperlipidemia, diabetes, alcohol abuse with alcoholic cirrhosis, polysubstance abuse, hypothyroidism, paroxysmal atrial fibrillation, CKD III, and sleep apnea who is admitted for exacerbation of congestive heart failure with marked volume overload/biventricular manifestations.  Assessment & Plan    1.  Acute on chronic combined systolic and diastolic heart failure: still needs substantial diuresis. Need more accurate weights. 2.  Right heart failure: She also has evidence of moderate to severe pulmonary artery hypertension that may be do not just to diastolic left heart failure, but also COPD and obesity. I expect she may have residual lower extremity edema even once we achieved optimal diuresis. 3.  Acute on chronic kidney disease stage 3b: improved, back to baseline creatinine which is probably around 1.4.  Continue diuretics. 4. Parox AFib: none on current admission. On very low dose amiodarone and apixaban. 5. PAD: including carotid, renal artery and lower extremity disease. 6. DM: severe hyperglycemia after receiving steroids for COPD. On insulin 7. PAH:  Likely due to both left heart failure and COPD-restrictive lung disease secondary to obesity.     For questions or updates, please contact [place] Please consult [email or web address] for contact info under        Signed, Sanda Klein, MD  12/05/2019, 8:50 AM

## 2019-12-06 DIAGNOSIS — I5033 Acute on chronic diastolic (congestive) heart failure: Secondary | ICD-10-CM

## 2019-12-06 LAB — BASIC METABOLIC PANEL WITH GFR
Anion gap: 10 (ref 5–15)
BUN: 51 mg/dL — ABNORMAL HIGH (ref 6–20)
CO2: 31 mmol/L (ref 22–32)
Calcium: 8.6 mg/dL — ABNORMAL LOW (ref 8.9–10.3)
Chloride: 96 mmol/L — ABNORMAL LOW (ref 98–111)
Creatinine, Ser: 1.33 mg/dL — ABNORMAL HIGH (ref 0.44–1.00)
GFR calc Af Amer: 51 mL/min — ABNORMAL LOW (ref >60)
GFR calc non Af Amer: 44 mL/min — ABNORMAL LOW (ref >60)
Glucose, Bld: 197 mg/dL — ABNORMAL HIGH (ref 70–99)
Potassium: 3.8 mmol/L (ref 3.5–5.1)
Sodium: 137 mmol/L (ref 135–145)

## 2019-12-06 LAB — GLUCOSE, CAPILLARY
Glucose-Capillary: 239 mg/dL — ABNORMAL HIGH (ref 70–99)
Glucose-Capillary: 215 mg/dL — ABNORMAL HIGH (ref 70–99)
Glucose-Capillary: 142 mg/dL — ABNORMAL HIGH (ref 70–99)
Glucose-Capillary: 208 mg/dL — ABNORMAL HIGH (ref 70–99)

## 2019-12-06 MED ORDER — FUROSEMIDE 10 MG/ML IJ SOLN
60.0000 mg | Freq: Two times a day (BID) | INTRAMUSCULAR | Status: DC
  Administered 2019-12-06 – 2019-12-08 (×5): 60 mg via INTRAVENOUS
  Filled 2019-12-06 (×5): qty 6

## 2019-12-06 MED ORDER — SPIRONOLACTONE 12.5 MG HALF TABLET
12.5000 mg | ORAL_TABLET | Freq: Every day | ORAL | Status: DC
  Administered 2019-12-06 – 2019-12-09 (×4): 12.5 mg via ORAL
  Filled 2019-12-06 (×4): qty 1

## 2019-12-06 MED ORDER — POTASSIUM CHLORIDE CRYS ER 20 MEQ PO TBCR
40.0000 meq | EXTENDED_RELEASE_TABLET | Freq: Once | ORAL | Status: AC
  Administered 2019-12-06: 40 meq via ORAL
  Filled 2019-12-06: qty 2

## 2019-12-06 MED ORDER — IPRATROPIUM-ALBUTEROL 0.5-2.5 (3) MG/3ML IN SOLN
3.0000 mL | Freq: Two times a day (BID) | RESPIRATORY_TRACT | Status: DC
  Administered 2019-12-06 – 2019-12-09 (×7): 3 mL via RESPIRATORY_TRACT
  Filled 2019-12-06 (×7): qty 3

## 2019-12-06 NOTE — Progress Notes (Signed)
Patient ID: [name], female   DOB: 03-23-1961, 59 y.o.   MRN: [id number]     Advanced Heart Failure Rounding Note  PCP-Cardiologist: Quay Burow, MD   Subjective:    Good diuresis yesterday, weight down 3 lbs.  Breathing getting better but still wheezing.    Echo: EF 50%, anterolateral hypokinesis, normal-appearing RV, PASP 60 mmHg.    Objective:   Weight Range: 104.1 kg Body mass index is 39.39 kg/m.   Vital Signs:   Temp:  [97.5 F (36.4 C)-98.2 F (36.8 C)] 98.1 F (36.7 C) (01/22 0758) Pulse Rate:  [69-88] 77 (01/22 0758) Resp:  [11-28] 19 (01/22 0758) BP: (139-153)/(63-80) 153/73 (01/22 0758) SpO2:  [91 %-100 %] 96 % (01/22 0801) Weight:  [104.1 kg] 104.1 kg (01/22 0245) Last BM Date: 12/05/19  Weight change: Filed Weights   12/04/19 0323 12/05/19 0054 12/06/19 0245  Weight: (!) 146.3 kg 105.4 kg 104.1 kg    Intake/Output:   Intake/Output Summary (Last 24 hours) at 12/06/2019 0814 Last data filed at 12/06/2019 0242 Gross per 24 hour  Intake 960 ml  Output 3100 ml  Net -2140 ml      Physical Exam    General:  Well appearing. No resp difficulty HEENT: Normal Neck: Supple. JVP 12-14 cm. Carotids 2+ bilat; no bruits. No lymphadenopathy or thyromegaly appreciated. Cor: PMI nondisplaced. Regular rate & rhythm. No rubs, gallops or murmurs. Lungs: Bilateral wheezes Abdomen: Soft, nontender, nondistended. No hepatosplenomegaly. No bruits or masses. Good bowel sounds. Extremities: No cyanosis, clubbing, rash. 1+ edema to knees.  Neuro: Alert & orientedx3, cranial nerves grossly intact. moves all 4 extremities w/o difficulty. Affect pleasant   Telemetry   NSR 70s, personally reviewed  Labs    CBC Recent Labs    12/05/19 0454  WBC 13.7*  NEUTROABS 12.7*  HGB 9.4*  HCT 31.5*  MCV 91.6  PLT [id number]   Basic Metabolic Panel Recent Labs    12/05/19 0454 12/06/19 0545  NA 140 137  K 4.9 3.8  CL 99 96*  CO2 28 31  GLUCOSE 262* 197*  BUN 59*  51*  CREATININE 1.41* 1.33*  CALCIUM 8.7* 8.6*  MG 2.4  --    Liver Function Tests Recent Labs    12/03/19 1320  AST 29  ALT 66*  ALKPHOS 439*  BILITOT 0.5  PROT 6.7  ALBUMIN 2.8*   No results for input(s): LIPASE, AMYLASE in the last 72 hours. Cardiac Enzymes No results for input(s): CKTOTAL, CKMB, CKMBINDEX, TROPONINI in the last 72 hours.  BNP: BNP (last 3 results) Recent Labs    12/01/19 1837  BNP 938.7*    ProBNP (last 3 results) No results for input(s): PROBNP in the last 8760 hours.   D-Dimer No results for input(s): DDIMER in the last 72 hours. Hemoglobin A1C No results for input(s): HGBA1C in the last 72 hours. Fasting Lipid Panel No results for input(s): CHOL, HDL, LDLCALC, TRIG, CHOLHDL, LDLDIRECT in the last 72 hours. Thyroid Function Tests No results for input(s): TSH, T4TOTAL, T3FREE, THYROIDAB in the last 72 hours.  Invalid input(s): FREET3  Other results:   Imaging     No results found.   Medications:     Scheduled Medications: . amiodarone  100 mg Oral Daily  . apixaban  5 mg Oral BID  . atorvastatin  40 mg Oral q1800  . budesonide (PULMICORT) nebulizer solution  0.25 mg Nebulization BID  . clopidogrel  75 mg Oral Daily  . dextromethorphan-guaiFENesin  1 tablet Oral BID  . folic acid  1 mg Oral Daily  . furosemide  40 mg Intravenous BID  . insulin aspart  0-15 Units Subcutaneous TID WC  . insulin aspart  0-5 Units Subcutaneous QHS  . insulin aspart  15 Units Subcutaneous TID WC  . insulin glargine  40 Units Subcutaneous QHS  . ipratropium-albuterol  3 mL Nebulization BID  . levothyroxine  50 mcg Oral Daily  . methylPREDNISolone (SOLU-MEDROL) injection  60 mg Intravenous Q12H  . multivitamin with minerals  1 tablet Oral Daily  . nicotine  21 mg Transdermal Daily  . nortriptyline  20 mg Oral QHS  . pantoprazole  40 mg Oral Daily  . sodium chloride flush  3 mL Intravenous Q12H  . thiamine  100 mg Oral Daily     Infusions:  . sodium chloride    . cefTRIAXone (ROCEPHIN)  IV 1 g (12/05/19 2254)     PRN Medications:  sodium chloride, acetaminophen, albuterol, ipratropium-albuterol, polyethylene glycol, sodium chloride flush    Assessment/Plan   1. Acute on chronic diastolic CHF: Has been presumed nonischemic given LHC in 12/15 showing only 80% stenosis in small OM1.  Echo 10/2018 EF 45-50% Grade II DD.  Echo this admission with EF 50% but PASP 60 mmHg. She has been diuresing well for the last couple days but is still significantly volume overloaded.  - Continue Lasix at 60 mg IV bid.  - Add spironolactone 12.5 mg daily.  - We discussed Cardiomems implantation after she recovers from the current COPD/CHF exacerbation, we had discussed this in the past. Will try to arrange after discharge.   2. Atrial fibrillation: Paroxysmal. She is in NSR.   - Continue amiodarone 100 mg daily. - Continue Eliquis 5 mg twice a day.  3. PAD: Claudication on left.  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 However, angiography in 8/20 showed left SFA occlusion. Dr. Gwenlyn Found wanted her to be seen by [facility].  - She has been on Plavix in addition to Eliquis.   4. AECOPD: Still wheezing.  She is on Solumedrol and ceftriaxone.  - Per primary service.  - Quit smoking.   5. H/o cirrhosis:  6. OSA: - Unable to tolerate CPAP.  7. CKD stage III:  Creatinine lower today. 8. HTN: Fairly stable.   9. Bradycardia:  - Junctional rhythm when hospitalized in 3/17.   Length of Stay: [place], MD  12/06/2019, 8:14 AM  Advanced Heart Failure Team Pager [phone] (M-F; 7a - 4p)  Please contact [facility] for night-coverage after hours (4p -7a ) and weekends on [email or web address]

## 2019-12-06 NOTE — Plan of Care (Signed)
  Problem: Activity: Goal: Risk for activity intolerance will decrease Outcome: Progressing   Problem: Safety: Goal: Ability to remain free from injury will improve Outcome: Progressing   

## 2019-12-06 NOTE — TOC Progression Note (Addendum)
Transition of Care [facility]) - Progression Note    Patient Details  Name: [name] MRN: [id number] Date of Birth: [birth year]  Transition [facility]) CM/SW Contact  Zenon Mayo, RN Phone Number: 12/06/2019, 2:54 PM  Clinical Narrative:    NCM contacted Sarah at [facility] to see if they could take referral for HHPT/HHOT, awaiting call back. NCM asked [name]  NP if patient could go home with teds instead of una boots, there  Are no HHRN available at [facility] agencies for this. [facility] unable to take, NCM made referral to [facility] with Alvis Lemmings , he was able to take for HHPT/HHOT.  Soc will begin 24 to 48 hrs post dc.    Expected Discharge Plan: [place] Barriers to Discharge: Continued Medical Work up  Expected Discharge Plan and Services Expected Discharge Plan: [place] Choice: [place] arrangements for the past 2 months: Single Family Home                           HH Arranged: PT, OT [place] Agency: [facility] [employer] Date [place]: 12/04/19 Time [place]: 0153 Representative spoke with at [place]: Ewa G[place] ([place]) Interventions    Readmission Risk Interventions No flowsheet data found.

## 2019-12-06 NOTE — Progress Notes (Signed)
PROGRESS NOTE    [name]  [id number] DOB: 1961-09-29 DOA: 12/01/2019 PCP: Tsosie Billing, MD (Inactive)    Brief Narrative:  HPI per Dr. Annamary [name] [name] is a 59 y.o. female with medical history significant of alcoholic liver cirrhosis, CAD, carotid artery disease, chronic combined systolic and diastolic congestive heart failure, COPD, PAD, hypertension, hyperlipidemia, hypothyroidism, paroxysmal atrial fibrillation, type 2 diabetes, OSA not on CPAP, cocaine use/polysubstance abuse presenting to the ED via EMS for evaluation of shortness of breath.  Family called EMS because patient has been getting more lethargic over the last several days and having labored breathing. Patient reports 3-day history of dyspnea and orthopnea.  She is coughing up clear sputum.  Her legs are swollen.  States she takes her home medications but is not able to tell me which medication she takes.  States she uses 1 inhaler for her COPD.  Denies chest pain.  Denies fevers, chills, nausea, vomiting, abdominal pain, or diarrhea.  No other complaints.  ED Course: Afebrile and hemodynamically stable.  Not hypoxic.  Patient appeared to be in respiratory distress on arrival to the ED.  She was wheezing.  Labs showing no leukocytosis.  Hemoglobin 8.5, at baseline.  BNP elevated at 938.  Creatinine 1.8, baseline 1.2-1.3.  AST 60, ALT 83, alk phos 438, T bili 0.7.  High-sensitivity troponin 22.  It is reported in the chart that initial ABG showing pH 7.45, PCO2 37, and PO2 37.  I spoke to respiratory and they confirm that this is an error, this was a VBG.  Chest x-ray showing hazy bilateral airspace opacities and small bilateral pleural effusions concerning for volume overload.  SARS-CoV-2 PCR test negative. Received albuterol continuous neb treatment, IV Solu-Medrol 125 mg, and IV Lasix 40 mg.  Assessment & Plan:   Principal Problem:   Acute exacerbation of CHF (congestive heart failure) ([name]) Active  Problems:   DM (diabetes mellitus), type 2 with peripheral vascular complications ([name])   COPD with acute exacerbation ([name])   AKI (acute kidney injury) ([place])   Acute respiratory failure with hypoxia ([name])   Acute renal failure ([place])   1 acute hypoxemic respiratory failure secondary to acute on chronic diastolic CHF and acute COPD exacerbation Patient presented with worsening shortness of breath and orthopnea.  Respiratory viral panel was negative.  SARS covid virus 2 PCR negative.  Chest x-ray concerning for volume overload with interstitial lung markings, bilateral pleural effusion concerning for congestive heart failure.  Patient on IV Lasix with urine output of 3.1 L over the past 24 hours.  Patient is -6.835 L during this hospitalization.  2D echo done with EF of 33%, grade 3 diastolic dysfunction, left ventricular anterior lateral hypokinesis/regional wall motion abnormalities.  Current weight of 229.5 pounds from 232.4 pounds from 322.5 pounds from 228.84 pounds on admission.  Lasix dose increased to 60 mg IV every 12 hours per cardiology and spironolactone added to regimen.  Continue Plavix, IV steroid taper, PPI, Pulmicort, Mucinex, duo nebs, antibiotics.  Supportive care.  Continue Lasix 40 mg IV every 12 hours, Plavix, IV steroid taper, PPI, Pulmicort nebs, Mucinex, duo nebs.  2.  Acute on chronic systolic and diastolic CHF exacerbation Patient presented with dyspnea and orthopnea.  Chest x-ray consistent with volume overload and bilateral pleural effusions.  2D echo done with EF of 00%, grade 3 diastolic dysfunction, left ventricular wall motion abnormalities.  Patient improving clinically daily.  Patient with a urine output of 3.1 L over  the past 24 hours.  Patient is -6.835 L during this hospitalization.  Current weight of 229.5 pounds from 232.4 pounds from 322.5 pounds from 228.84 pounds on admission.  IV Lasix dose has been increased to 60 mg every 12 hours per cardiology.   Spironolactone added to current regimen per cardiology.  Continue Lipitor. Hold off on beta-blocker due to acute decompensation.  Cardiology consulted and has reassessed 2D echo and feels no significant change from previous study with evidence of increased left atrial and right atrial filling pressures with slight worsening of pulmonary hypertension.  Continue current treatment per cardiology.   3.  Acute kidney injury on chronic kidney disease stage IIIb Baseline creatinine 1.2-1.4.  Creatinine improving with diuresis.  Creatinine currently at 1.33.  Follow.  4.  Elevated troponin Patient noted to have a slightly elevated troponin that was trending down felt secondary to demand ischemia secondary to problem #1.  2D echo however with wall motion abnormalities, grade 3 diastolic dysfunction, EF of 50%.  Per cardiology.  5.  Insulin-dependent diabetes mellitus Hemoglobin A1c of 7.3.  CBG of 239.  Elevated CBG likely secondary to steroids.  Continue current dose of Lantus 40 units daily, meal coverage insulin 15 units 3 times daily, sliding scale insulin.  Monitor CBGs with steroid taper.  6.  Hypothyroidism Synthroid.   7.  Paroxysmal A. fib In sinus rhythm.  Continue amiodarone.  Eliquis for anticoagulation.  Cardiology following.  8.  Hyperlipidemia Statin.    9.  Coronary artery disease Plavix.   10.  History of alcohol abuse Stable.  No signs of withdrawal.  Continue Ativan withdrawal protocol.  11.  Tobacco abuse Tobacco cessation stressed to patient.  Continue nicotine patch.  12.  Klebsiella pneumonia UTI. Patient started on doxycycline which has subsequently been discontinued and patient currently on IV Rocephin.     DVT prophylaxis: Eliquis Code Status: DNR Family Communication: Updated patient.  No family at bedside. Disposition Plan: To be determined.  Likely home when clinically improved.   Consultants:   None  Procedures:   Chest x-ray 12/01/2019  2D echo  12/02/2019    Antimicrobials:   Doxycycline 12/05/2019>>> 12/05/2019  IV Rocephin 12/05/2019   Subjective: Patient sitting up in chair.  Waiting to get a bath.  States shortness of breath improving.  States wheezing improving.  Concerned about blood glucose levels going up with steroids.   Objective: Vitals:   12/06/19 0728 12/06/19 0758 12/06/19 0801 12/06/19 0846  BP:  (!) 153/73  (!) 149/69  Pulse:  77  74  Resp: '17 19  18  ' Temp:  98.1 F (36.7 C)    TempSrc:  Oral    SpO2:  100% 96% 92%  Weight:      Height:        Intake/Output Summary (Last 24 hours) at 12/06/2019 1138 Last data filed at 12/06/2019 0859 Gross per 24 hour  Intake 840 ml  Output 2800 ml  Net -1960 ml   Filed Weights   12/04/19 0323 12/05/19 0054 12/06/19 0245  Weight: (!) 146.3 kg 105.4 kg 104.1 kg    Examination:  General exam: NAD. Respiratory system: Minimal to mild inspiratory to expiratory wheezing.  Diffuse crackles.  Speaking in full sentences. Cardiovascular system: Regular rate rhythm.  No murmurs rubs or gallops.  3+ bilateral lower extremity edema. Gastrointestinal system: Abdomen is soft, nontender, nondistended, positive bowel sounds.  No rebound.  No guarding.  Central nervous system: Alert and oriented. No focal neurological deficits.  Extremities: Status post right transmetatarsal amputation.  Symmetric 5 x 5 power. Skin: no rashes, lesions or ulcers Psychiatry: Judgement and insight appear normal. Mood & affect appropriate.     Data Reviewed: I have personally reviewed following labs and imaging studies  CBC: Recent Labs  Lab 12/01/19 1837 12/01/19 1856 12/01/19 2225 12/05/19 0454  WBC 8.0  --   --  13.7*  NEUTROABS 6.0  --   --  12.7*  HGB 8.5* 7.8* 9.5* 9.4*  HCT 28.4* 23.0* 28.0* 31.5*  MCV 94.7  --   --  91.6  PLT 299  --   --  161   Basic Metabolic Panel: Recent Labs  Lab 12/03/19 1320 12/03/19 1320 12/04/19 0340 12/04/19 1218 12/04/19 1824 12/05/19 0454  12/06/19 0545  NA 135  --  135 135  --  140 137  K 4.8  --  4.9 4.9  --  4.9 3.8  CL 99  --  97* 96*  --  99 96*  CO2 26  --  26 27  --  28 31  GLUCOSE 342*   < > 433* 477* 467* 262* 197*  BUN 63*  --  69* 66*  --  59* 51*  CREATININE 1.75*  --  1.64* 1.57*  --  1.41* 1.33*  CALCIUM 8.5*  --  8.7* 8.8*  --  8.7* 8.6*  MG  --   --   --   --   --  2.4  --    < > = values in this interval not displayed.   GFR: Estimated Creatinine Clearance: 54.9 mL/min (A) (by C-G formula based on SCr of 1.33 mg/dL (H)). Liver Function Tests: Recent Labs  Lab 12/01/19 1837 12/02/19 0405 12/03/19 1320  AST 60* 54* 29  ALT 83* 83* 66*  ALKPHOS 438* 434* 439*  BILITOT 0.7 0.7 0.5  PROT 6.7 6.8 6.7  ALBUMIN 2.7* 2.8* 2.8*   No results for input(s): LIPASE, AMYLASE in the last 168 hours. No results for input(s): AMMONIA in the last 168 hours. Coagulation Profile: No results for input(s): INR, PROTIME in the last 168 hours. Cardiac Enzymes: No results for input(s): CKTOTAL, CKMB, CKMBINDEX, TROPONINI in the last 168 hours. BNP (last 3 results) No results for input(s): PROBNP in the last 8760 hours. HbA1C: No results for input(s): HGBA1C in the last 72 hours. CBG: Recent Labs  Lab 12/05/19 0624 12/05/19 1157 12/05/19 1646 12/05/19 2156 12/06/19 0618  GLUCAP 308* 284* 304* 473* 239*   Lipid Profile: No results for input(s): CHOL, HDL, LDLCALC, TRIG, CHOLHDL, LDLDIRECT in the last 72 hours. Thyroid Function Tests: No results for input(s): TSH, T4TOTAL, FREET4, T3FREE, THYROIDAB in the last 72 hours. Anemia Panel: No results for input(s): VITAMINB12, FOLATE, FERRITIN, TIBC, IRON, RETICCTPCT in the last 72 hours. Sepsis Labs: Recent Labs  Lab 12/02/19 0405  PROCALCITON <0.10    Recent Results (from the past 240 hour(s))  Respiratory Panel by RT PCR (Flu A&B, Covid) - Nasopharyngeal Swab     Status: None   Collection Time: 12/01/19  6:37 PM   Specimen: Nasopharyngeal Swab  Result  Value Ref Range Status   SARS Coronavirus 2 by RT PCR NEGATIVE NEGATIVE Final    Comment: (NOTE) SARS-CoV-2 target nucleic acids are NOT DETECTED. The SARS-CoV-2 RNA is generally detectable in upper respiratoy specimens during the acute phase of infection. The lowest concentration of SARS-CoV-2 viral copies this assay can detect is 131 copies/mL. A negative result does not preclude SARS-Cov-2  infection and should not be used as the sole basis for treatment or other patient management decisions. A negative result may occur with  improper specimen collection/handling, submission of specimen other than nasopharyngeal swab, presence of viral mutation(s) within the areas targeted by this assay, and inadequate number of viral copies (<131 copies/mL). A negative result must be combined with clinical observations, patient history, and epidemiological information. The expected result is Negative. Fact Sheet for Patients:  PinkCheek.be Fact Sheet for Healthcare Providers:  GravelBags.it This test is not yet ap proved or cleared by the [place] FDA and  has been authorized for detection and/or diagnosis of SARS-CoV-2 by FDA under an Emergency Use Authorization (EUA). This EUA will remain  in effect (meaning this test can be used) for the duration of the COVID-19 declaration under Section 564(b)(1) of the Act, 21 U.S.C. section 360bbb-3(b)(1), unless the authorization is terminated or revoked sooner.    Influenza A by PCR NEGATIVE NEGATIVE Final   Influenza B by PCR NEGATIVE NEGATIVE Final    Comment: (NOTE) The Xpert Xpress SARS-CoV-2/FLU/RSV assay is intended as an aid in  the diagnosis of influenza from Nasopharyngeal swab specimens and  should not be used as a sole basis for treatment. Nasal washings and  aspirates are unacceptable for Xpert Xpress SARS-CoV-2/FLU/RSV  testing. Fact Sheet for  Patients: PinkCheek.be Fact Sheet for Healthcare Providers: GravelBags.it This test is not yet approved or cleared by the [place] FDA and  has been authorized for detection and/or diagnosis of SARS-CoV-2 by  FDA under an Emergency Use Authorization (EUA). This EUA will remain  in effect (meaning this test can be used) for the duration of the  Covid-19 declaration under Section 564(b)(1) of the Act, 21  U.S.C. section 360bbb-3(b)(1), unless the authorization is  terminated or revoked. Performed at [facility], [place] [address] [place], [place] [address]   Urine culture     Status: Abnormal   Collection Time: 12/02/19 12:41 AM   Specimen: Urine, Clean Catch  Result Value Ref Range Status   Specimen Description URINE, CLEAN CATCH  Final   Special Requests   Final    NONE Performed at [facility], [place] [address] [place], [place] [address]    Culture >=100,000 COLONIES/mL KLEBSIELLA PNEUMONIAE (A)  Final   Report Status 12/04/2019 FINAL  Final   Organism ID, Bacteria KLEBSIELLA PNEUMONIAE (A)  Final      Susceptibility   Klebsiella pneumoniae - MIC*    AMPICILLIN RESISTANT Resistant     CEFAZOLIN <=4 SENSITIVE Sensitive     CEFTRIAXONE <=0.25 SENSITIVE Sensitive     CIPROFLOXACIN <=0.25 SENSITIVE Sensitive     GENTAMICIN <=1 SENSITIVE Sensitive     IMIPENEM <=0.25 SENSITIVE Sensitive     NITROFURANTOIN 128 RESISTANT Resistant     TRIMETH/SULFA <=20 SENSITIVE Sensitive     AMPICILLIN/SULBACTAM <=2 SENSITIVE Sensitive     PIP/TAZO <=4 SENSITIVE Sensitive     * >=100,000 COLONIES/mL KLEBSIELLA PNEUMONIAE  MRSA PCR Screening     Status: None   Collection Time: 12/02/19  4:05 AM   Specimen: Nasal Mucosa; Nasopharyngeal  Result Value Ref Range Status   MRSA by PCR NEGATIVE NEGATIVE Final    Comment:        The GeneXpert MRSA Assay (FDA approved for NASAL specimens only), is one component of  a comprehensive MRSA colonization surveillance program. It is not intended to diagnose MRSA infection nor to guide or monitor treatment for MRSA infections.  Performed at [facility], [place] [address], [place], [place] [address]          Radiology Studies: No results found.      Scheduled Meds: . amiodarone  100 mg Oral Daily  . apixaban  5 mg Oral BID  . atorvastatin  40 mg Oral q1800  . budesonide (PULMICORT) nebulizer solution  0.25 mg Nebulization BID  . clopidogrel  75 mg Oral Daily  . dextromethorphan-guaiFENesin  1 tablet Oral BID  . folic acid  1 mg Oral Daily  . furosemide  60 mg Intravenous BID  . insulin aspart  0-15 Units Subcutaneous TID WC  . insulin aspart  0-5 Units Subcutaneous QHS  . insulin aspart  15 Units Subcutaneous TID WC  . insulin glargine  40 Units Subcutaneous QHS  . ipratropium-albuterol  3 mL Nebulization BID  . levothyroxine  50 mcg Oral Daily  . methylPREDNISolone (SOLU-MEDROL) injection  60 mg Intravenous Q12H  . multivitamin with minerals  1 tablet Oral Daily  . nicotine  21 mg Transdermal Daily  . nortriptyline  20 mg Oral QHS  . pantoprazole  40 mg Oral Daily  . sodium chloride flush  3 mL Intravenous Q12H  . spironolactone  12.5 mg Oral Daily  . thiamine  100 mg Oral Daily   Continuous Infusions: . sodium chloride    . cefTRIAXone (ROCEPHIN)  IV 1 g (12/05/19 2254)     LOS: 5 days    Time spent: 35 minutes    Irine Seal, MD [facility]  If 7PM-7AM, please contact night-coverage [email or web address] 12/06/2019, 11:38 AM

## 2019-12-06 NOTE — Progress Notes (Signed)
Inpatient Diabetes Program Recommendations  AACE/ADA: New Consensus Statement on Inpatient Glycemic Control (2015)  Target Ranges:  Prepandial:   less than 140 mg/dL      Peak postprandial:   less than 180 mg/dL (1-2 hours)      Critically ill patients:  140 - 180 mg/dL   Lab Results  Component Value Date   GLUCAP 215 (H) 12/06/2019   HGBA1C 7.3 (H) 12/02/2019    Review of Glycemic Control Results for [name], [name] (MRN [id number]) as of 12/06/2019 12:46  Ref. Range 12/05/2019 11:57 12/05/2019 16:46 12/05/2019 21:56 12/06/2019 06:18 12/06/2019 11:58  Glucose-Capillary Latest Ref Range: 70 - 99 mg/dL 284 (H) 304 (H) 473 (H) 239 (H) 215 (H)   Diabetes history: DM2 Outpatient Diabetes medications:  Lantus 40 units daily + Humalog 15-20 TID  Current orders for Inpatient glycemic control:  Lantus 40 units daily + Novolog 0-15 TID + 0-5 QHS + Novolog 12 units TID with meals + Solumedrol 60 Q12H   Inpatient Diabetes Program Recommendations:    Consider increasing Lantus to 44 units QD.   Thanks, Bronson Curb, MSN, RNC-OB Diabetes Coordinator [phone] (8a-5p)

## 2019-12-06 NOTE — Care Management Important Message (Signed)
Important Message  Patient Details  Name: [name] MRN: [id number] Date of Birth: 14-May-1961   Medicare Important Message Given:  Yes     Shelda Altes 12/06/2019, 2:09 PM

## 2019-12-06 NOTE — Progress Notes (Signed)
Orthopedic Tech Progress Note Patient Details:  [name] Dec 15, 1960 [id number]  Ortho Devices Type of Ortho Device: Haematologist Ortho Device/Splint Location: bilateral Ortho Device/Splint Interventions: Application, Ordered   Post Interventions Patient Tolerated: Well Instructions Provided: Poper ambulation with device, Care of device, Adjustment of device   Janit Pagan 12/06/2019, 1:59 PM

## 2019-12-06 NOTE — Plan of Care (Signed)
  Problem: Activity: Goal: Risk for activity intolerance will decrease 12/06/2019 [id number] by Barton Dubois, RN Outcome: Progressing 12/06/2019 0618 by Barton Dubois, RN Outcome: Progressing   Problem: Safety: Goal: Ability to remain free from injury will improve 12/06/2019 [id number] by Barton Dubois, RN Outcome: Progressing 12/06/2019 0618 by Barton Dubois, RN Outcome: Progressing

## 2019-12-06 NOTE — Progress Notes (Signed)
Occupational Therapy Treatment Patient Details Name: [name] MRN: [id number] DOB: [birth year] Today's Date: 12/06/2019    History of present illness Patient is a 59 y/o female who presents with SOB. Admitted with acute on chronic respiratory failure secondary to CHF exacerbation. CXR-showing hazy bilateral airspace opacities and small bilateral pleural effusions concerning for volume overload. PMH includes DM, tobacco abuse, smoker, CAD, PVD s/p stent, Afib, COPD, Cirrhosis, OSA, CKD, HTN, chronic combined systolic and diastolic CHF and cocaine, cocaine use/polysubstance abuse.   OT comments  Pt progressing toward goals and adequate level to return home from OT standpoint. Energy conservation education provided this session. Pt plans to remain on second floor with bed and bathroom majority of the day once returning home .    Follow Up Recommendations  Home health OT;Supervision - Intermittent    Equipment Recommendations  None recommended by OT    Recommendations for Other Services      Precautions / Restrictions Precautions Precautions: Fall Precaution Comments: watch O2 , wears an insert to help with feet pain       Mobility Bed Mobility               General bed mobility comments: OOB in chair  Transfers Overall transfer level: Needs assistance Equipment used: Rolling walker (2 wheeled) Transfers: Sit to/from Stand Sit to Stand: Min guard              Balance           Standing balance support: Bilateral upper extremity supported;During functional activity Standing balance-Leahy Scale: Fair                             ADL either performed or assessed with clinical judgement   ADL Overall ADL's : Needs assistance/impaired Eating/Feeding: Independent   Grooming: Modified independent                   Toilet Transfer: Supervision/safety;RW           Functional mobility during ADLs: Supervision/safety;Rolling  walker General ADL Comments: pt educated thsis session on energy conservation specifically water temperature being a new task that patient could do. pt report no room in house for her 3n1. pt states roommate (who is her cousin) and reports she doesnt want her to spill bucket and males living in house now and cant use living room space for 3n1 like befire     Vision       Perception     Praxis      Cognition Arousal/Alertness: Awake/alert Behavior During Therapy: [facility] for tasks assessed/performed;Impulsive Overall Cognitive Status: No family/caregiver present to determine baseline cognitive functioning                                 General Comments: Follows all simple commands, pleasant and willing to participate in therapy        Exercises     Shoulder Instructions       General Comments ortho tech arriving to apply bil wraps. Pt reports weighting herself daily for weight gain    Pertinent Vitals/ Pain       Pain Assessment: Faces Faces Pain Scale: Hurts little more Pain Location: feet Pain Descriptors / Indicators: Discomfort;Cramping;Pins and needles Pain Intervention(s): Monitored during session;Repositioned  Home Living  Prior Functioning/Environment              Frequency  Min 2X/week        Progress Toward Goals  OT Goals(current goals can now be found in the care plan section)  Progress towards OT goals: Progressing toward goals  Acute Rehab OT Goals Patient Stated Goal: to go home OT Goal Formulation: With patient Time For Goal Achievement: 12/17/19 Potential to Achieve Goals: Good ADL Goals Pt Will Perform Grooming: with modified independence;standing Pt Will Perform Lower Body Bathing: with modified independence;sit to/from stand Pt Will Perform Upper Body Dressing: with modified independence;sitting Pt Will Perform Lower Body Dressing: with modified independence;sit  to/from stand Pt Will Transfer to Toilet: with modified independence;ambulating Pt Will Perform Toileting - Clothing Manipulation and hygiene: with modified independence;sit to/from stand  Plan Discharge plan remains appropriate    Co-evaluation                 AM-PAC OT "6 Clicks" Daily Activity     Outcome Measure   Help from another person eating meals?: None Help from another person taking care of personal grooming?: A Little Help from another person toileting, which includes using toliet, bedpan, or urinal?: A Lot Help from another person bathing (including washing, rinsing, drying)?: A Little Help from another person to put on and taking off regular upper body clothing?: None Help from another person to put on and taking off regular lower body clothing?: A Little 6 Click Score: 19    End of Session Equipment Utilized During Treatment: Gait belt;Rolling walker  OT Visit Diagnosis: Unsteadiness on feet (R26.81);Muscle weakness (generalized) (M62.81)   Activity Tolerance Patient tolerated treatment well   Patient Left in chair;with call bell/phone within reach   Nurse Communication Mobility status;Precautions        Time: [id number] OT Time Calculation (min): 12 min  Charges: OT General Charges $OT Visit: 1 Visit OT Treatments $Self Care/Home Management : 8-22 mins   Brynn, OTR/L  Acute Rehabilitation Services Pager: [phone] Office: [phone] .    Jeri Modena 12/06/2019, 2:07 PM

## 2019-12-06 NOTE — Progress Notes (Signed)
Physical Therapy Treatment Patient Details Name: [name] MRN: [id number] DOB: 09/30/1961 Today's Date: 12/06/2019    History of Present Illness Patient is a 59 y/o female who presents with SOB. Admitted with acute on chronic respiratory failure secondary to CHF exacerbation. CXR-showing hazy bilateral airspace opacities and small bilateral pleural effusions concerning for volume overload. PMH includes DM, tobacco abuse, smoker, CAD, PVD s/p stent, Afib, COPD, Cirrhosis, OSA, CKD, HTN, chronic combined systolic and diastolic CHF and cocaine, cocaine use/polysubstance abuse.    PT Comments    Patient is making progress toward PT goals. Current plan remains appropriate.    Follow Up Recommendations  Home health PT;Supervision - Intermittent     Equipment Recommendations  None recommended by PT    Recommendations for Other Services       Precautions / Restrictions Precautions Precautions: Fall Precaution Comments: watch O2 , wears an insert to help with feet pain    Mobility  Bed Mobility               General bed mobility comments: OOB in chair  Transfers Overall transfer level: Needs assistance Equipment used: Rolling walker (2 wheeled) Transfers: Sit to/from Stand Sit to Stand: Min guard            Ambulation/Gait Ambulation/Gait assistance: Min guard Gait Distance (Feet): 120 Feet Assistive device: Rolling walker (2 wheeled) Gait Pattern/deviations: Step-through pattern;Decreased stride length Gait velocity: decreased   General Gait Details: limited distance due to painful feet (neuropathic pain-chronic); min guard for safety; pt denies SOB; SpO2 >92% on RA    Stairs             Wheelchair Mobility    Modified Rankin (Stroke Patients Only)       Balance           Standing balance support: Bilateral upper extremity supported;During functional activity Standing balance-Leahy Scale: Fair                               Cognition Arousal/Alertness: Awake/alert Behavior During Therapy: WFL for tasks assessed/performed;Impulsive Overall Cognitive Status: Within Functional Limits for tasks assessed                                 General Comments: Follows all simple commands, pleasant and willing to participate in therapy      Exercises      General Comments General comments (skin integrity, edema, etc.): ortho tech arriving to apply bil wraps. Pt reports weighting herself daily for weight gain      Pertinent Vitals/Pain Pain Assessment: Faces Faces Pain Scale: Hurts little more Pain Location: feet Pain Descriptors / Indicators: Discomfort;Cramping;Pins and needles Pain Intervention(s): Limited activity within patient's tolerance;Monitored during session;Repositioned    Home Living                      Prior Function            PT Goals (current goals can now be found in the care plan section) Acute Rehab PT Goals Patient Stated Goal: to go home Progress towards PT goals: Progressing toward goals    Frequency    Min 3X/week      PT Plan Current plan remains appropriate    Co-evaluation              AM-PAC PT "6 Clicks" Mobility  Outcome Measure  Help needed turning from your back to your side while in a flat bed without using bedrails?: None Help needed moving from lying on your back to sitting on the side of a flat bed without using bedrails?: A Little Help needed moving to and from a bed to a chair (including a wheelchair)?: A Little Help needed standing up from a chair using your arms (e.g., wheelchair or bedside chair)?: A Little Help needed to walk in hospital room?: A Little Help needed climbing 3-5 steps with a railing? : A Little 6 Click Score: 19    End of Session Equipment Utilized During Treatment: Gait belt Activity Tolerance: Patient tolerated treatment well Patient left: in chair;with call bell/phone within reach;Other (comment)(OT  present) Nurse Communication: Mobility status PT Visit Diagnosis: Unsteadiness on feet (R26.81);Muscle weakness (generalized) (M62.81)     Time: [id number] PT Time Calculation (min) (ACUTE ONLY): 16 min  Charges:  $Gait Training: 8-22 mins                     Earney Navy, PTA Acute Rehabilitation Services Pager: [phone] Office: [phone]     Darliss Cheney 12/06/2019, 4:38 PM

## 2019-12-06 NOTE — Plan of Care (Signed)
  Problem: Activity: Goal: Risk for activity intolerance will decrease Outcome: Progressing   Problem: Pain Managment: Goal: General experience of comfort will improve Outcome: Progressing   Problem: Safety: Goal: Ability to remain free from injury will improve Outcome: Progressing   

## 2019-12-06 NOTE — Progress Notes (Signed)
Nurse called Janne Napoleon.  Ortho tech aware of orders.

## 2019-12-06 NOTE — Progress Notes (Signed)
BIPAP at bedside pt currently on RA and tolerating well. Refuses to wear at this time. RT to cont to monitor

## 2019-12-07 DIAGNOSIS — I5023 Acute on chronic systolic (congestive) heart failure: Secondary | ICD-10-CM

## 2019-12-07 LAB — BASIC METABOLIC PANEL
Anion gap: 12 (ref 5–15)
BUN: 54 mg/dL — ABNORMAL HIGH (ref 6–20)
CO2: 30 mmol/L (ref 22–32)
Calcium: 8.5 mg/dL — ABNORMAL LOW (ref 8.9–10.3)
Chloride: 95 mmol/L — ABNORMAL LOW (ref 98–111)
Creatinine, Ser: 1.27 mg/dL — ABNORMAL HIGH (ref 0.44–1.00)
GFR calc Af Amer: 54 mL/min — ABNORMAL LOW (ref >60)
GFR calc non Af Amer: 47 mL/min — ABNORMAL LOW (ref >60)
Glucose, Bld: 244 mg/dL — ABNORMAL HIGH (ref 70–99)
Potassium: 4.2 mmol/L (ref 3.5–5.1)
Sodium: 137 mmol/L (ref 135–145)

## 2019-12-07 LAB — CBC WITH DIFFERENTIAL/PLATELET
Abs Immature Granulocytes: 0.1 10*3/uL — ABNORMAL HIGH (ref 0.00–0.07)
Basophils Absolute: 0 10*3/uL (ref 0.0–0.1)
Basophils Relative: 0 %
Eosinophils Absolute: 0 10*3/uL (ref 0.0–0.5)
Eosinophils Relative: 0 %
HCT: 32.1 % — ABNORMAL LOW (ref 36.0–46.0)
Hemoglobin: 9.9 g/dL — ABNORMAL LOW (ref 12.0–15.0)
Immature Granulocytes: 1 %
Lymphocytes Relative: 6 %
Lymphs Abs: 0.8 10*3/uL (ref 0.7–4.0)
MCH: 27 pg (ref 26.0–34.0)
MCHC: 30.8 g/dL (ref 30.0–36.0)
MCV: 87.7 fL (ref 80.0–100.0)
Monocytes Absolute: 0.7 10*3/uL (ref 0.1–1.0)
Monocytes Relative: 5 %
Neutro Abs: 12.3 10*3/uL — ABNORMAL HIGH (ref 1.7–7.7)
Neutrophils Relative %: 88 %
Platelets: 331 10*3/uL (ref 150–400)
RBC: 3.66 MIL/uL — ABNORMAL LOW (ref 3.87–5.11)
RDW: 15 % (ref 11.5–15.5)
WBC: 13.9 10*3/uL — ABNORMAL HIGH (ref 4.0–10.5)
nRBC: 0 % (ref 0.0–0.2)

## 2019-12-07 LAB — GLUCOSE, CAPILLARY
Glucose-Capillary: 271 mg/dL — ABNORMAL HIGH (ref 70–99)
Glucose-Capillary: 326 mg/dL — ABNORMAL HIGH (ref 70–99)
Glucose-Capillary: 189 mg/dL — ABNORMAL HIGH (ref 70–99)
Glucose-Capillary: 210 mg/dL — ABNORMAL HIGH (ref 70–99)

## 2019-12-07 MED ORDER — METHYLPREDNISOLONE SODIUM SUCC 125 MG IJ SOLR: 60.0000 mg | INTRAMUSCULAR | Status: DC

## 2019-12-07 MED ORDER — PREDNISONE 50 MG PO TABS
60.0000 mg | ORAL_TABLET | Freq: Every day | ORAL | Status: DC
  Administered 2019-12-08 – 2019-12-09 (×2): 60 mg via ORAL
  Filled 2019-12-07 (×2): qty 1

## 2019-12-07 MED ORDER — INSULIN GLARGINE 100 UNIT/ML ~~LOC~~ SOLN
44.0000 [IU] | Freq: Every day | SUBCUTANEOUS | Status: DC
  Administered 2019-12-07 – 2019-12-08 (×2): 44 [IU] via SUBCUTANEOUS
  Filled 2019-12-07 (×4): qty 0.44

## 2019-12-07 MED ORDER — CEFDINIR 300 MG PO CAPS
300.0000 mg | ORAL_CAPSULE | Freq: Two times a day (BID) | ORAL | Status: DC
  Administered 2019-12-07 – 2019-12-09 (×5): 300 mg via ORAL
  Filled 2019-12-07 (×6): qty 1

## 2019-12-07 NOTE — Progress Notes (Signed)
SATURATION QUALIFICATIONS: (This note is used to comply with regulatory documentation for home oxygen)  Patient Saturations on Room Air at Rest = 96%  Patient Saturations on Room Air while Ambulating =91%  Patient Saturations on N/A  Liters of oxygen while Ambulating = N/A   Please briefly explain why patient needs home oxygen: No need for oxygen therapy as patient able to walk without requiring oxygen

## 2019-12-07 NOTE — Progress Notes (Signed)
Patient ID: [name], female   DOB: [birth year], 59 y.o.   MRN: [id number]     Advanced Heart Failure Rounding Note  PCP-Cardiologist: Quay Burow, MD   Subjective:    Good diuresis yesterday, weight down another 2 pounds. Breathing better says she feels like she is getting back to normal. No orthopnea or PND. No CP   Echo: EF 50%, anterolateral hypokinesis, normal-appearing RV, PASP 60 mmHg.    Objective:   Weight Range: 103 kg Body mass index is 38.98 kg/m.   Vital Signs:   Temp:  [97.7 F (36.5 C)-98.2 F (36.8 C)] 97.7 F (36.5 C) (01/23 0505) Pulse Rate:  [74-84] 76 (01/23 0852) Resp:  [14-20] 20 (01/23 0852) BP: (146-169)/(79-95) 169/95 (01/23 0505) SpO2:  [94 %-100 %] 94 % (01/23 0852) Weight:  [103 kg] 103 kg (01/23 0055) Last BM Date: 12/06/19  Weight change: Filed Weights   12/05/19 0054 12/06/19 0245 12/07/19 0055  Weight: 105.4 kg 104.1 kg 103 kg    Intake/Output:   Intake/Output Summary (Last 24 hours) at 12/07/2019 0853 Last data filed at 12/07/2019 0058 Gross per 24 hour  Intake 1210 ml  Output 4400 ml  Net -3190 ml      Physical Exam    General:  Sitting in chair. No resp difficulty HEENT: normal Neck: supple. JVP hard to see. Does not look markedly elevated Carotids 2+ bilat; no bruits. No lymphadenopathy or thryomegaly appreciated. Cor: PMI nondisplaced. Regular rate & rhythm. No rubs, gallops or murmurs. Lungs: clear Abdomen: obese soft, nontender, nondistended. No hepatosplenomegaly. No bruits or masses. Good bowel sounds. Extremities: no cyanosis, clubbing, rash, edema Neuro: alert & orientedx3, cranial nerves grossly intact. moves all 4 extremities w/o difficulty. Affect pleasant    Telemetry   NSR 70-80s, personally reviewed  Labs    CBC Recent Labs    12/05/19 0454 12/07/19 0542  WBC 13.7* 13.9*  NEUTROABS 12.7* 12.3*  HGB 9.4* 9.9*  HCT 31.5* 32.1*  MCV 91.6 87.7  PLT 334 [id number]   Basic Metabolic Panel Recent  Labs    12/05/19 0454 12/05/19 0454 12/06/19 0545 12/07/19 0542  NA 140   < > 137 137  K 4.9   < > 3.8 4.2  CL 99   < > 96* 95*  CO2 28   < > 31 30  GLUCOSE 262*   < > 197* 244*  BUN 59*   < > 51* 54*  CREATININE 1.41*   < > 1.33* 1.27*  CALCIUM 8.7*   < > 8.6* 8.5*  MG 2.4  --   --   --    < > = values in this interval not displayed.   Liver Function Tests No results for input(s): AST, ALT, ALKPHOS, BILITOT, PROT, ALBUMIN in the last 72 hours. No results for input(s): LIPASE, AMYLASE in the last 72 hours. Cardiac Enzymes No results for input(s): CKTOTAL, CKMB, CKMBINDEX, TROPONINI in the last 72 hours.  BNP: BNP (last 3 results) Recent Labs    12/01/19 1837  BNP 938.7*    ProBNP (last 3 results) No results for input(s): PROBNP in the last 8760 hours.   D-Dimer No results for input(s): DDIMER in the last 72 hours. Hemoglobin A1C No results for input(s): HGBA1C in the last 72 hours. Fasting Lipid Panel No results for input(s): CHOL, HDL, LDLCALC, TRIG, CHOLHDL, LDLDIRECT in the last 72 hours. Thyroid Function Tests No results for input(s): TSH, T4TOTAL, T3FREE, THYROIDAB in the last 72 hours.  Invalid input(s): FREET3  Other results:   Imaging    No results found.   Medications:     Scheduled Medications: . amiodarone  100 mg Oral Daily  . apixaban  5 mg Oral BID  . atorvastatin  40 mg Oral q1800  . budesonide (PULMICORT) nebulizer solution  0.25 mg Nebulization BID  . cefdinir  300 mg Oral Q12H  . clopidogrel  75 mg Oral Daily  . dextromethorphan-guaiFENesin  1 tablet Oral BID  . folic acid  1 mg Oral Daily  . furosemide  60 mg Intravenous BID  . insulin aspart  0-15 Units Subcutaneous TID WC  . insulin aspart  0-5 Units Subcutaneous QHS  . insulin aspart  15 Units Subcutaneous TID WC  . insulin glargine  40 Units Subcutaneous QHS  . ipratropium-albuterol  3 mL Nebulization BID  . levothyroxine  50 mcg Oral Daily  . [START ON 12/08/2019]  methylPREDNISolone (SOLU-MEDROL) injection  60 mg Intravenous Q24H  . multivitamin with minerals  1 tablet Oral Daily  . nicotine  21 mg Transdermal Daily  . nortriptyline  20 mg Oral QHS  . pantoprazole  40 mg Oral Daily  . sodium chloride flush  3 mL Intravenous Q12H  . spironolactone  12.5 mg Oral Daily  . thiamine  100 mg Oral Daily    Infusions: . sodium chloride 250 mL (12/06/19 1643)    PRN Medications: sodium chloride, acetaminophen, albuterol, ipratropium-albuterol, polyethylene glycol, sodium chloride flush    Assessment/Plan   1. Acute on chronic diastolic CHF: Has been presumed nonischemic given LHC in 12/15 showing only 80% stenosis in small OM1.  Echo 10/2018 EF 45-50% Grade II DD.  Echo this admission with EF 50% but PASP 60 mmHg. She has been diuresing well for the last couple days - seems to be getting close to baseline. Renal function stable - Continue Lasix at 60 mg IV bid one more day then switch to po in am and can probably d/c in am on home regimen - Continue spironolactone 12.5 mg daily.  - Likely will proceed with Cardiomems implant after she recovers from the current COPD/CHF exacerbation. Will try to arrange after discharge.   2. Atrial fibrillation: Paroxysmal. She is in NSR.   - Continue amiodarone 100 mg daily. - Continue Eliquis 5 mg twice a day. No bleeding 3. PAD: Claudication on left.  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 However, angiography in 8/20 showed left SFA occlusion. Dr. Gwenlyn Found wanted her to be seen by [facility].  - She has been on Plavix in addition to Eliquis.   4. AECOPD: Still wheezing.  She is on Solumedrol and ceftriaxone.  - Per primary service.  - Quit smoking.   5. H/o cirrhosis:  6. OSA: - Unable to tolerate CPAP.  7. CKD stage III:  Creatinine stable today.  8. HTN: Fairly stable.   9. Bradycardia:  - Junctional rhythm when hospitalized in 3/17. 10. DM2 -  with CAD/PAD and CKD. Strongly consider SGLT2i at d/c    Length of Stay: Lucas, MD  12/07/2019, 8:53 AM  Advanced Heart Failure Team Pager [phone] (M-F; 7a - 4p)  Please contact [facility] for night-coverage after hours (4p -7a ) and weekends on [email or web address]

## 2019-12-07 NOTE — Progress Notes (Signed)
PROGRESS NOTE    [name]  ASN:[id number] DOB: 01/08/1961 DOA: 12/01/2019 PCP: Tsosie Billing, MD (Inactive)    Brief Narrative:  HPI per Dr. Annamary [name] [name] is a 59 y.o. female with medical history significant of alcoholic liver cirrhosis, CAD, carotid artery disease, chronic combined systolic and diastolic congestive heart failure, COPD, PAD, hypertension, hyperlipidemia, hypothyroidism, paroxysmal atrial fibrillation, type 2 diabetes, OSA not on CPAP, cocaine use/polysubstance abuse presenting to the ED via EMS for evaluation of shortness of breath.  Family called EMS because patient has been getting more lethargic over the last several days and having labored breathing. Patient reports 3-day history of dyspnea and orthopnea.  She is coughing up clear sputum.  Her legs are swollen.  States she takes her home medications but is not able to tell me which medication she takes.  States she uses 1 inhaler for her COPD.  Denies chest pain.  Denies fevers, chills, nausea, vomiting, abdominal pain, or diarrhea.  No other complaints.  ED Course: Afebrile and hemodynamically stable.  Not hypoxic.  Patient appeared to be in respiratory distress on arrival to the ED.  She was wheezing.  Labs showing no leukocytosis.  Hemoglobin 8.5, at baseline.  BNP elevated at 938.  Creatinine 1.8, baseline 1.2-1.3.  AST 60, ALT 83, alk phos 438, T bili 0.7.  High-sensitivity troponin 22.  It is reported in the chart that initial ABG showing pH 7.45, PCO2 37, and PO2 37.  I spoke to respiratory and they confirm that this is an error, this was a VBG.  Chest x-ray showing hazy bilateral airspace opacities and small bilateral pleural effusions concerning for volume overload.  SARS-CoV-2 PCR test negative. Received albuterol continuous neb treatment, IV Solu-Medrol 125 mg, and IV Lasix 40 mg.  Assessment & Plan:   Principal Problem:   Acute exacerbation of CHF (congestive heart failure) ([name]) Active  Problems:   DM (diabetes mellitus), type 2 with peripheral vascular complications ([name])   COPD with acute exacerbation ([name])   AKI (acute kidney injury) ([place])   Acute respiratory failure with hypoxia ([name])   Acute renal failure ([place])   1 acute hypoxemic respiratory failure secondary to acute on chronic diastolic CHF and acute COPD exacerbation Patient presented with worsening shortness of breath and orthopnea.  Respiratory viral panel was negative.  SARS covid virus 2 PCR negative.  Chest x-ray concerning for volume overload with interstitial lung markings, bilateral pleural effusion concerning for congestive heart failure.  Patient on IV Lasix with urine output of 4.4 L over the past 24 hours.  Patient is -7.278 L during this hospitalization.  2D echo done with EF of 19%, grade 3 diastolic dysfunction, left ventricular anterior lateral hypokinesis/regional wall motion abnormalities.  Current weight of 227.1 pounds from 229.5 pounds from 232.4 pounds from 322.5 pounds from 228.84 pounds on admission.  Lasix dose increased to 60 mg IV every 12 hours per cardiology and spironolactone added to regimen.  Per cardiology 1 more day of IV Lasix and subsequently transition to oral Lasix.  Continue Plavix, IV steroid taper likely transition to oral prednisone tomorrow, PPI, Pulmicort, Mucinex, duo nebs, antibiotics.  Supportive care.  Continue Plavix, PPI, Pulmicort nebs, Mucinex, duo nebs.  2.  Acute on chronic systolic and diastolic CHF exacerbation Patient presented with dyspnea and orthopnea.  Chest x-ray consistent with volume overload and bilateral pleural effusions.  2D echo done with EF of 37%, grade 3 diastolic dysfunction, left ventricular wall motion abnormalities.  Patient improving clinically daily.  Patient with a urine output of 4.4 L over the past 24 hours.  Patient is -7.278 L during this hospitalization.  Current weight of 227.1 pounds from 229.5 pounds from 232.4 pounds from 322.5 pounds from  228.84 pounds on admission.  IV Lasix dose has been increased to 60 mg every 12 hours per cardiology.  Spironolactone added to current regimen per cardiology.  Continue Lipitor. Hold off on beta-blocker due to acute decompensation.  Cardiology recommending 1 more day of IV Lasix and subsequently transition to oral diuretics tomorrow.  Cardiology consulted and has reassessed 2D echo and feels no significant change from previous study with evidence of increased left atrial and right atrial filling pressures with slight worsening of pulmonary hypertension.  Continue current treatment per cardiology.   3.  Acute kidney injury on chronic kidney disease stage IIIb Baseline creatinine 1.2-1.4.  Creatinine improving with diuresis.  Creatinine currently at 1.27.  Follow.  4.  Elevated troponin Patient noted to have a slightly elevated troponin that was trending down felt secondary to demand ischemia secondary to problem #1.  2D echo however with wall motion abnormalities, grade 3 diastolic dysfunction, EF of 50%.  Per cardiology.  5.  Insulin-dependent diabetes mellitus Hemoglobin A1c of 7.3.  CBG of 271.  Elevated CBG likely secondary to steroids.  Will transition from IV Solu-Medrol to oral prednisone tomorrow.  Increase Lantus to 44 units daily.  Continue current meal coverage insulin 15 units 3 times daily.  Continue sliding scale insulin.   6.  Hypothyroidism Continue Synthroid.  Outpatient follow-up.  7.  Paroxysmal A. fib In sinus rhythm.  Continue amiodarone.  Eliquis for anticoagulation.  Cardiology following.  8.  Hyperlipidemia Continue statin.    9.  Coronary artery disease Continue Plavix, Lipitor, Eliquis, spironolactone.   10.  History of alcohol abuse Stable.  No signs of withdrawal.  Continue Ativan withdrawal protocol.  11.  Tobacco abuse Tobacco cessation stressed to patient.  Continue nicotine patch.  12.  Klebsiella pneumonia UTI. Patient started on doxycycline which has  subsequently been discontinued and patient currently on IV Rocephin.  Will transition from IV Rocephin to oral Omnicef to complete course of treatment.    DVT prophylaxis: Eliquis Code Status: DNR Family Communication: Updated patient.  No family at bedside. Disposition Plan: Likely home when clinically improved and cleared by cardiology hopefully in the next 1 to 2 days.   Consultants:   Cardiology: Dr. Sallyanne Kuster 12/04/2019  Procedures:   Chest x-ray 12/01/2019  2D echo 12/02/2019    Antimicrobials:   Doxycycline 12/05/2019>>> 12/05/2019  IV Rocephin 12/05/2019>>>>> 12/07/2019  Omnicef 12/07/2019   Subjective: Patient sitting up in chair.  Few shortness of breath improved and close to her baseline.  Denies any chest pain.  No abdominal pain.    Objective: Vitals:   12/07/19 0751 12/07/19 0852 12/07/19 0904 12/07/19 0905  BP:      Pulse:  76 76 76  Resp:  '20 19 16  ' Temp:      TempSrc:      SpO2: 98% 94% 90% 94%  Weight:      Height:        Intake/Output Summary (Last 24 hours) at 12/07/2019 0930 Last data filed at 12/07/2019 0900 Gross per 24 hour  Intake 1330 ml  Output 3800 ml  Net -2470 ml   Filed Weights   12/05/19 0054 12/06/19 0245 12/07/19 0055  Weight: 105.4 kg 104.1 kg 103 kg  Examination:  General exam: NAD. Respiratory system: Decreased breath sounds in the bases.  Otherwise clear to auscultation.  Speaking in full sentences.  Cardiovascular system: RRR.  No murmurs rubs or gallops.  2-3+ bilateral lower extremity edema.  Gastrointestinal system: Abdomen is nontender, nondistended, soft, positive bowel sounds.  No rebound.  No guarding.  Central nervous system: Alert and oriented. No focal neurological deficits. Extremities: Status post right transmetatarsal amputation.  Symmetric 5 x 5 power. Skin: no rashes, lesions or ulcers Psychiatry: Judgement and insight appear normal. Mood & affect appropriate.     Data Reviewed: I have personally  reviewed following labs and imaging studies  CBC: Recent Labs  Lab 12/01/19 1837 12/01/19 1856 12/01/19 2225 12/05/19 0454 12/07/19 0542  WBC 8.0  --   --  13.7* 13.9*  NEUTROABS 6.0  --   --  12.7* 12.3*  HGB 8.5* 7.8* 9.5* 9.4* 9.9*  HCT 28.4* 23.0* 28.0* 31.5* 32.1*  MCV 94.7  --   --  91.6 87.7  PLT 299  --   --  334 242   Basic Metabolic Panel: Recent Labs  Lab 12/04/19 0340 12/04/19 0340 12/04/19 1218 12/04/19 1824 12/05/19 0454 12/06/19 0545 12/07/19 0542  NA 135  --  135  --  140 137 137  K 4.9  --  4.9  --  4.9 3.8 4.2  CL 97*  --  96*  --  99 96* 95*  CO2 26  --  27  --  '28 31 30  ' GLUCOSE 433*   < > 477* 467* 262* 197* 244*  BUN 69*  --  66*  --  59* 51* 54*  CREATININE 1.64*  --  1.57*  --  1.41* 1.33* 1.27*  CALCIUM 8.7*  --  8.8*  --  8.7* 8.6* 8.5*  MG  --   --   --   --  2.4  --   --    < > = values in this interval not displayed.   GFR: Estimated Creatinine Clearance: 57.1 mL/min (A) (by C-G formula based on SCr of 1.27 mg/dL (H)). Liver Function Tests: Recent Labs  Lab 12/01/19 1837 12/02/19 0405 12/03/19 1320  AST 60* 54* 29  ALT 83* 83* 66*  ALKPHOS 438* 434* 439*  BILITOT 0.7 0.7 0.5  PROT 6.7 6.8 6.7  ALBUMIN 2.7* 2.8* 2.8*   No results for input(s): LIPASE, AMYLASE in the last 168 hours. No results for input(s): AMMONIA in the last 168 hours. Coagulation Profile: No results for input(s): INR, PROTIME in the last 168 hours. Cardiac Enzymes: No results for input(s): CKTOTAL, CKMB, CKMBINDEX, TROPONINI in the last 168 hours. BNP (last 3 results) No results for input(s): PROBNP in the last 8760 hours. HbA1C: No results for input(s): HGBA1C in the last 72 hours. CBG: Recent Labs  Lab 12/06/19 0618 12/06/19 1158 12/06/19 1633 12/06/19 2129 12/07/19 0634  GLUCAP 239* 215* 142* 208* 271*   Lipid Profile: No results for input(s): CHOL, HDL, LDLCALC, TRIG, CHOLHDL, LDLDIRECT in the last 72 hours. Thyroid Function Tests: No  results for input(s): TSH, T4TOTAL, FREET4, T3FREE, THYROIDAB in the last 72 hours. Anemia Panel: No results for input(s): VITAMINB12, FOLATE, FERRITIN, TIBC, IRON, RETICCTPCT in the last 72 hours. Sepsis Labs: Recent Labs  Lab 12/02/19 0405  PROCALCITON <0.10    Recent Results (from the past 240 hour(s))  Respiratory Panel by RT PCR (Flu A&B, Covid) - Nasopharyngeal Swab     Status: None   Collection Time:  12/01/19  6:37 PM   Specimen: Nasopharyngeal Swab  Result Value Ref Range Status   SARS Coronavirus 2 by RT PCR NEGATIVE NEGATIVE Final    Comment: (NOTE) SARS-CoV-2 target nucleic acids are NOT DETECTED. The SARS-CoV-2 RNA is generally detectable in upper respiratoy specimens during the acute phase of infection. The lowest concentration of SARS-CoV-2 viral copies this assay can detect is 131 copies/mL. A negative result does not preclude SARS-Cov-2 infection and should not be used as the sole basis for treatment or other patient management decisions. A negative result may occur with  improper specimen collection/handling, submission of specimen other than nasopharyngeal swab, presence of viral mutation(s) within the areas targeted by this assay, and inadequate number of viral copies (<131 copies/mL). A negative result must be combined with clinical observations, patient history, and epidemiological information. The expected result is Negative. Fact Sheet for Patients:  PinkCheek.be Fact Sheet for Healthcare Providers:  GravelBags.it This test is not yet ap proved or cleared by the [place] FDA and  has been authorized for detection and/or diagnosis of SARS-CoV-2 by FDA under an Emergency Use Authorization (EUA). This EUA will remain  in effect (meaning this test can be used) for the duration of the COVID-19 declaration under Section 564(b)(1) of the Act, 21 U.S.C. section 360bbb-3(b)(1), unless the authorization  is terminated or revoked sooner.    Influenza A by PCR NEGATIVE NEGATIVE Final   Influenza B by PCR NEGATIVE NEGATIVE Final    Comment: (NOTE) The Xpert Xpress SARS-CoV-2/FLU/RSV assay is intended as an aid in  the diagnosis of influenza from Nasopharyngeal swab specimens and  should not be used as a sole basis for treatment. Nasal washings and  aspirates are unacceptable for Xpert Xpress SARS-CoV-2/FLU/RSV  testing. Fact Sheet for Patients: PinkCheek.be Fact Sheet for Healthcare Providers: GravelBags.it This test is not yet approved or cleared by the [place] FDA and  has been authorized for detection and/or diagnosis of SARS-CoV-2 by  FDA under an Emergency Use Authorization (EUA). This EUA will remain  in effect (meaning this test can be used) for the duration of the  Covid-19 declaration under Section 564(b)(1) of the Act, 21  U.S.C. section 360bbb-3(b)(1), unless the authorization is  terminated or revoked. Performed at [facility], [place] [address], [place], [place] [address]   Urine culture     Status: Abnormal   Collection Time: 12/02/19 12:41 AM   Specimen: Urine, Clean Catch  Result Value Ref Range Status   Specimen Description URINE, CLEAN CATCH  Final   Special Requests   Final    NONE Performed at [facility], [place] [address] [place], [place] [address]    Culture >=100,000 COLONIES/mL KLEBSIELLA PNEUMONIAE (A)  Final   Report Status 12/04/2019 FINAL  Final   Organism ID, Bacteria KLEBSIELLA PNEUMONIAE (A)  Final      Susceptibility   Klebsiella pneumoniae - MIC*    AMPICILLIN RESISTANT Resistant     CEFAZOLIN <=4 SENSITIVE Sensitive     CEFTRIAXONE <=0.25 SENSITIVE Sensitive     CIPROFLOXACIN <=0.25 SENSITIVE Sensitive     GENTAMICIN <=1 SENSITIVE Sensitive     IMIPENEM <=0.25 SENSITIVE Sensitive     NITROFURANTOIN 128 RESISTANT Resistant     TRIMETH/SULFA <=20 SENSITIVE Sensitive      AMPICILLIN/SULBACTAM <=2 SENSITIVE Sensitive     PIP/TAZO <=4 SENSITIVE Sensitive     * >=100,000 COLONIES/mL KLEBSIELLA PNEUMONIAE  MRSA PCR Screening     Status: None  Collection Time: 12/02/19  4:05 AM   Specimen: Nasal Mucosa; Nasopharyngeal  Result Value Ref Range Status   MRSA by PCR NEGATIVE NEGATIVE Final    Comment:        The GeneXpert MRSA Assay (FDA approved for NASAL specimens only), is one component of a comprehensive MRSA colonization surveillance program. It is not intended to diagnose MRSA infection nor to guide or monitor treatment for MRSA infections. Performed at [facility], [place] [address] [place], [place] [address]          Radiology Studies: No results found.      Scheduled Meds: . amiodarone  100 mg Oral Daily  . apixaban  5 mg Oral BID  . atorvastatin  40 mg Oral q1800  . budesonide (PULMICORT) nebulizer solution  0.25 mg Nebulization BID  . cefdinir  300 mg Oral Q12H  . clopidogrel  75 mg Oral Daily  . dextromethorphan-guaiFENesin  1 tablet Oral BID  . folic acid  1 mg Oral Daily  . furosemide  60 mg Intravenous BID  . insulin aspart  0-15 Units Subcutaneous TID WC  . insulin aspart  0-5 Units Subcutaneous QHS  . insulin aspart  15 Units Subcutaneous TID WC  . insulin glargine  40 Units Subcutaneous QHS  . ipratropium-albuterol  3 mL Nebulization BID  . levothyroxine  50 mcg Oral Daily  . [START ON 12/08/2019] methylPREDNISolone (SOLU-MEDROL) injection  60 mg Intravenous Q24H  . multivitamin with minerals  1 tablet Oral Daily  . nicotine  21 mg Transdermal Daily  . nortriptyline  20 mg Oral QHS  . pantoprazole  40 mg Oral Daily  . sodium chloride flush  3 mL Intravenous Q12H  . spironolactone  12.5 mg Oral Daily  . thiamine  100 mg Oral Daily   Continuous Infusions: . sodium chloride 250 mL (12/06/19 1643)     LOS: 6 days    Time spent: 35 minutes    Karla Seal, MD [facility]  If 7PM-7AM, please  contact night-coverage [email or web address] 12/07/2019, 9:30 AM

## 2019-12-08 DIAGNOSIS — I10 Essential (primary) hypertension: Secondary | ICD-10-CM

## 2019-12-08 LAB — BASIC METABOLIC PANEL
Anion gap: 11 (ref 5–15)
BUN: 50 mg/dL — ABNORMAL HIGH (ref 6–20)
CO2: 29 mmol/L (ref 22–32)
Calcium: 8.6 mg/dL — ABNORMAL LOW (ref 8.9–10.3)
Chloride: 97 mmol/L — ABNORMAL LOW (ref 98–111)
Creatinine, Ser: 1.15 mg/dL — ABNORMAL HIGH (ref 0.44–1.00)
GFR calc Af Amer: >60 mL/min (ref >60)
GFR calc non Af Amer: 53 mL/min — ABNORMAL LOW (ref >60)
Glucose, Bld: 88 mg/dL (ref 70–99)
Potassium: 3.5 mmol/L (ref 3.5–5.1)
Sodium: 137 mmol/L (ref 135–145)

## 2019-12-08 LAB — CBC
HCT: 33.7 % — ABNORMAL LOW (ref 36.0–46.0)
Hemoglobin: 10.4 g/dL — ABNORMAL LOW (ref 12.0–15.0)
MCH: 27.2 pg (ref 26.0–34.0)
MCHC: 30.9 g/dL (ref 30.0–36.0)
MCV: 88 fL (ref 80.0–100.0)
Platelets: 352 10*3/uL (ref 150–400)
RBC: 3.83 MIL/uL — ABNORMAL LOW (ref 3.87–5.11)
RDW: 15 % (ref 11.5–15.5)
WBC: 16 10*3/uL — ABNORMAL HIGH (ref 4.0–10.5)
nRBC: 0 % (ref 0.0–0.2)

## 2019-12-08 LAB — GLUCOSE, CAPILLARY
Glucose-Capillary: 142 mg/dL — ABNORMAL HIGH (ref 70–99)
Glucose-Capillary: 167 mg/dL — ABNORMAL HIGH (ref 70–99)
Glucose-Capillary: 231 mg/dL — ABNORMAL HIGH (ref 70–99)
Glucose-Capillary: 133 mg/dL — ABNORMAL HIGH (ref 70–99)

## 2019-12-08 MED ORDER — AMLODIPINE BESYLATE 10 MG PO TABS
10.0000 mg | ORAL_TABLET | Freq: Every day | ORAL | Status: DC
  Administered 2019-12-08 – 2019-12-09 (×2): 10 mg via ORAL
  Filled 2019-12-08 (×2): qty 1

## 2019-12-08 MED ORDER — POTASSIUM CHLORIDE CRYS ER 20 MEQ PO TBCR
40.0000 meq | EXTENDED_RELEASE_TABLET | Freq: Once | ORAL | Status: AC
  Administered 2019-12-08: 09:00:00 40 meq via ORAL
  Filled 2019-12-08: qty 2

## 2019-12-08 MED ORDER — TORSEMIDE 20 MG PO TABS
40.0000 mg | ORAL_TABLET | Freq: Two times a day (BID) | ORAL | Status: DC
  Administered 2019-12-08 – 2019-12-09 (×2): 40 mg via ORAL
  Filled 2019-12-08 (×2): qty 2

## 2019-12-08 NOTE — Progress Notes (Signed)
PROGRESS NOTE    [name]  BPP:[id number] DOB: 04/08/1961 DOA: 12/01/2019 PCP: Tsosie Billing, MD (Inactive)    Brief Narrative:  HPI per Dr. Annamary [name] [name] is a 59 y.o. female with medical history significant of alcoholic liver cirrhosis, CAD, carotid artery disease, chronic combined systolic and diastolic congestive heart failure, COPD, PAD, hypertension, hyperlipidemia, hypothyroidism, paroxysmal atrial fibrillation, type 2 diabetes, OSA not on CPAP, cocaine use/polysubstance abuse presenting to the ED via EMS for evaluation of shortness of breath.  Family called EMS because patient has been getting more lethargic over the last several days and having labored breathing. Patient reports 3-day history of dyspnea and orthopnea.  She is coughing up clear sputum.  Her legs are swollen.  States she takes her home medications but is not able to tell me which medication she takes.  States she uses 1 inhaler for her COPD.  Denies chest pain.  Denies fevers, chills, nausea, vomiting, abdominal pain, or diarrhea.  No other complaints.  ED Course: Afebrile and hemodynamically stable.  Not hypoxic.  Patient appeared to be in respiratory distress on arrival to the ED.  She was wheezing.  Labs showing no leukocytosis.  Hemoglobin 8.5, at baseline.  BNP elevated at 938.  Creatinine 1.8, baseline 1.2-1.3.  AST 60, ALT 83, alk phos 438, T bili 0.7.  High-sensitivity troponin 22.  It is reported in the chart that initial ABG showing pH 7.45, PCO2 37, and PO2 37.  I spoke to respiratory and they confirm that this is an error, this was a VBG.  Chest x-ray showing hazy bilateral airspace opacities and small bilateral pleural effusions concerning for volume overload.  SARS-CoV-2 PCR test negative. Received albuterol continuous neb treatment, IV Solu-Medrol 125 mg, and IV Lasix 40 mg.  Assessment & Plan:   Principal Problem:   Acute exacerbation of CHF (congestive heart failure) ([name]) Active  Problems:   DM (diabetes mellitus), type 2 with peripheral vascular complications ([name])   COPD with acute exacerbation ([name])   AKI (acute kidney injury) ([place])   Acute respiratory failure with hypoxia ([name])   Acute renal failure ([place])   1 acute hypoxemic respiratory failure secondary to acute on chronic diastolic CHF and acute COPD exacerbation Patient presented with worsening shortness of breath and orthopnea.  Respiratory viral panel was negative.  SARS covid virus 2 PCR negative.  Chest x-ray concerning for volume overload with interstitial lung markings, bilateral pleural effusion concerning for congestive heart failure.  Clinical improvement.   Patient on IV Lasix with urine output of 650 cc over the past 24 hours.  Patient is - 8.094 L during this hospitalization.  2D echo done with EF of 09%, grade 3 diastolic dysfunction, left ventricular anterior lateral hypokinesis/regional wall motion abnormalities.  Current weight of 226.5 pounds from 227.1 pounds from 229.5 pounds from 232.4 pounds from 322.5 pounds from 228.84 pounds on admission.  Lasix dose was increased to 60 mg IV every 12 hours per cardiology and spironolactone added to regimen.  Per cardiology likely transition to oral diuretics today.  Continue Plavix, PPI, Pulmicort, Mucinex, duo nebs, antibiotics.  Discontinue IV Solu-Medrol and transition to oral prednisone taper.  Supportive care.   2.  Acute on chronic systolic and diastolic CHF exacerbation Patient presented with dyspnea and orthopnea.  Chest x-ray consistent with volume overload and bilateral pleural effusions.  2D echo done with EF of 29%, grade 3 diastolic dysfunction, left ventricular wall motion abnormalities.  Patient improving clinically daily.  Patient with a urine output of 650 cc over the past 24 hours.  Patient is - 8.094 L during this hospitalization.  Current weight of 226.5 pounds from 227.1 pounds from 229.5 pounds from 232.4 pounds from 322.5 pounds from 228.84  pounds on admission.  IV Lasix dose has been increased to 60 mg every 12 hours per cardiology.  Spironolactone added to current regimen per cardiology.  Continue Lipitor. Hold off on beta-blocker due to acute decompensation.  Cardiology recommending likely transition to oral diuretics today. Cardiology consulted and has reassessed 2D echo and feels no significant change from previous study with evidence of increased left atrial and right atrial filling pressures with slight worsening of pulmonary hypertension.  Continue current treatment per cardiology.  Likely needs outpatient follow-up with cardiology.  3.  Acute kidney injury on chronic kidney disease stage IIIb Baseline creatinine 1.2-1.4.  Creatinine improving with diuresis.  Creatinine currently at 1.15.  Follow.  4.  Elevated troponin Patient noted to have a slightly elevated troponin that was trending down felt secondary to demand ischemia secondary to problem #1.  2D echo however with wall motion abnormalities, grade 3 diastolic dysfunction, EF of 50%.  Per cardiology.  5.  Insulin-dependent diabetes mellitus Hemoglobin A1c of 7.3.  CBG of 142.  Elevated CBG likely secondary to steroids.  Transition from IV Solu-Medrol to oral prednisone taper.  Continue current dose of Lantus.  Continue meal coverage insulin.  Follow.   6.  Hypothyroidism Synthroid.  Outpatient follow-up.   7.  Paroxysmal A. fib In sinus rhythm.  Continue amiodarone.  Eliquis for anticoagulation.  Cardiology following.  8.  Hyperlipidemia Statin.   9.  Coronary artery disease Continue Plavix, Lipitor, Eliquis, spironolactone.   10.  History of alcohol abuse No signs of withdrawal.  On Ativan withdrawal protocol.   11.  Tobacco abuse Tobacco cessation stressed to patient.  Continue nicotine patch.  12.  Klebsiella pneumonia UTI. Patient started on doxycycline which has subsequently been discontinued and patient currently on IV Rocephin.  Patient currently on  Omnicef to complete a course of antibiotic treatment.  13.  Hypertension Continue diuretics, spironolactone.  May need to resume home regimen Norvasc.  Zebeta on hold due to acute decompensation.    DVT prophylaxis: Eliquis Code Status: DNR Family Communication: Updated patient.  No family at bedside. Disposition Plan: Likely home tomorrow if continued clinical improvement.   Consultants:   Cardiology: Dr. Sallyanne Kuster 12/04/2019  Procedures:   Chest x-ray 12/01/2019  2D echo 12/02/2019    Antimicrobials:   Doxycycline 12/05/2019>>> 12/05/2019  IV Rocephin 12/05/2019>>>>> 12/07/2019  Omnicef 12/07/2019   Subjective: Patient sitting up in chair playing a game on her telephone.  States shortness of breath has improved.  Denies any chest pain.  No abdominal pain.    Objective: Vitals:   12/08/19 0524 12/08/19 0700 12/08/19 0757 12/08/19 0845  BP: (!) 153/76     Pulse: 74  81   Resp: '20 13 15   ' Temp: 97.9 F (36.6 C)  98.2 F (36.8 C)   TempSrc: Oral  Oral   SpO2: 94%  95% 96%  Weight:      Height:        Intake/Output Summary (Last 24 hours) at 12/08/2019 1008 Last data filed at 12/08/2019 0903 Gross per 24 hour  Intake 1530 ml  Output 650 ml  Net 880 ml   Filed Weights   12/06/19 0245 12/07/19 0055 12/08/19 0500  Weight: 104.1 kg 103 kg 102.7 kg  Examination:  General exam: NAD. Respiratory system: Decreased breath sounds in the bases.  No wheezing noted.  Speaking in full sentences.  Normal respiratory effort.  Cardiovascular system: Regular rate rhythm no murmurs rubs or gallops.  1-2+ bilateral lower extremity edema.   Gastrointestinal system: Abdomen is obese, soft, nontender, nondistended, positive bowel sounds.  No rebound.  No guarding.   Central nervous system: Alert and oriented. No focal neurological deficits. Extremities: 1-2+ bilateral lower extremity edema.  Lower extremities wrapped.  Status post right transmetatarsal amputation.  Symmetric 5 x 5  power. Skin: no rashes, lesions or ulcers Psychiatry: Judgement and insight appear normal. Mood & affect appropriate.     Data Reviewed: I have personally reviewed following labs and imaging studies  CBC: Recent Labs  Lab 12/01/19 1837 12/01/19 1837 12/01/19 1856 12/01/19 2225 12/05/19 0454 12/07/19 0542 12/08/19 0531  WBC 8.0  --   --   --  13.7* 13.9* 16.0*  NEUTROABS 6.0  --   --   --  12.7* 12.3*  --   HGB 8.5*   < > 7.8* 9.5* 9.4* 9.9* 10.4*  HCT 28.4*   < > 23.0* 28.0* 31.5* 32.1* 33.7*  MCV 94.7  --   --   --  91.6 87.7 88.0  PLT 299  --   --   --  334 331 352   < > = values in this interval not displayed.   Basic Metabolic Panel: Recent Labs  Lab 12/04/19 1218 12/04/19 1218 12/04/19 1824 12/05/19 0454 12/06/19 0545 12/07/19 0542 12/08/19 0531  NA 135  --   --  140 137 137 137  K 4.9  --   --  4.9 3.8 4.2 3.5  CL 96*  --   --  99 96* 95* 97*  CO2 27  --   --  '28 31 30 29  ' GLUCOSE 477*   < > 467* 262* 197* 244* 88  BUN 66*  --   --  59* 51* 54* 50*  CREATININE 1.57*  --   --  1.41* 1.33* 1.27* 1.15*  CALCIUM 8.8*  --   --  8.7* 8.6* 8.5* 8.6*  MG  --   --   --  2.4  --   --   --    < > = values in this interval not displayed.   GFR: Estimated Creatinine Clearance: 63 mL/min (A) (by C-G formula based on SCr of 1.15 mg/dL (H)). Liver Function Tests: Recent Labs  Lab 12/01/19 1837 12/02/19 0405 12/03/19 1320  AST 60* 54* 29  ALT 83* 83* 66*  ALKPHOS 438* 434* 439*  BILITOT 0.7 0.7 0.5  PROT 6.7 6.8 6.7  ALBUMIN 2.7* 2.8* 2.8*   No results for input(s): LIPASE, AMYLASE in the last 168 hours. No results for input(s): AMMONIA in the last 168 hours. Coagulation Profile: No results for input(s): INR, PROTIME in the last 168 hours. Cardiac Enzymes: No results for input(s): CKTOTAL, CKMB, CKMBINDEX, TROPONINI in the last 168 hours. BNP (last 3 results) No results for input(s): PROBNP in the last 8760 hours. HbA1C: No results for input(s): HGBA1C in  the last 72 hours. CBG: Recent Labs  Lab 12/07/19 0634 12/07/19 1149 12/07/19 1623 12/07/19 2125 12/08/19 0640  GLUCAP 271* 326* 189* 210* 142*   Lipid Profile: No results for input(s): CHOL, HDL, LDLCALC, TRIG, CHOLHDL, LDLDIRECT in the last 72 hours. Thyroid Function Tests: No results for input(s): TSH, T4TOTAL, FREET4, T3FREE, THYROIDAB in the last 72 hours.  Anemia Panel: No results for input(s): VITAMINB12, FOLATE, FERRITIN, TIBC, IRON, RETICCTPCT in the last 72 hours. Sepsis Labs: Recent Labs  Lab 12/02/19 0405  PROCALCITON <0.10    Recent Results (from the past 240 hour(s))  Respiratory Panel by RT PCR (Flu A&B, Covid) - Nasopharyngeal Swab     Status: None   Collection Time: 12/01/19  6:37 PM   Specimen: Nasopharyngeal Swab  Result Value Ref Range Status   SARS Coronavirus 2 by RT PCR NEGATIVE NEGATIVE Final    Comment: (NOTE) SARS-CoV-2 target nucleic acids are NOT DETECTED. The SARS-CoV-2 RNA is generally detectable in upper respiratoy specimens during the acute phase of infection. The lowest concentration of SARS-CoV-2 viral copies this assay can detect is 131 copies/mL. A negative result does not preclude SARS-Cov-2 infection and should not be used as the sole basis for treatment or other patient management decisions. A negative result may occur with  improper specimen collection/handling, submission of specimen other than nasopharyngeal swab, presence of viral mutation(s) within the areas targeted by this assay, and inadequate number of viral copies (<131 copies/mL). A negative result must be combined with clinical observations, patient history, and epidemiological information. The expected result is Negative. Fact Sheet for Patients:  PinkCheek.be Fact Sheet for Healthcare Providers:  GravelBags.it This test is not yet ap proved or cleared by the [place] FDA and  has been authorized for  detection and/or diagnosis of SARS-CoV-2 by FDA under an Emergency Use Authorization (EUA). This EUA will remain  in effect (meaning this test can be used) for the duration of the COVID-19 declaration under Section 564(b)(1) of the Act, 21 U.S.C. section 360bbb-3(b)(1), unless the authorization is terminated or revoked sooner.    Influenza A by PCR NEGATIVE NEGATIVE Final   Influenza B by PCR NEGATIVE NEGATIVE Final    Comment: (NOTE) The Xpert Xpress SARS-CoV-2/FLU/RSV assay is intended as an aid in  the diagnosis of influenza from Nasopharyngeal swab specimens and  should not be used as a sole basis for treatment. Nasal washings and  aspirates are unacceptable for Xpert Xpress SARS-CoV-2/FLU/RSV  testing. Fact Sheet for Patients: PinkCheek.be Fact Sheet for Healthcare Providers: GravelBags.it This test is not yet approved or cleared by the [place] FDA and  has been authorized for detection and/or diagnosis of SARS-CoV-2 by  FDA under an Emergency Use Authorization (EUA). This EUA will remain  in effect (meaning this test can be used) for the duration of the  Covid-19 declaration under Section 564(b)(1) of the Act, 21  U.S.C. section 360bbb-3(b)(1), unless the authorization is  terminated or revoked. Performed at [facility], [place] [address], [place], [place] [address]   Urine culture     Status: Abnormal   Collection Time: 12/02/19 12:41 AM   Specimen: Urine, Clean Catch  Result Value Ref Range Status   Specimen Description URINE, CLEAN CATCH  Final   Special Requests   Final    NONE Performed at [facility], [place] [address], [place], [place] [address]    Culture >=100,000 COLONIES/mL KLEBSIELLA PNEUMONIAE (A)  Final   Report Status 12/04/2019 FINAL  Final   Organism ID, Bacteria KLEBSIELLA PNEUMONIAE (A)  Final      Susceptibility   Klebsiella pneumoniae - MIC*    AMPICILLIN RESISTANT Resistant       CEFAZOLIN <=4 SENSITIVE Sensitive     CEFTRIAXONE <=0.25 SENSITIVE Sensitive     CIPROFLOXACIN <=0.25 SENSITIVE Sensitive     GENTAMICIN <=1 SENSITIVE  Sensitive     IMIPENEM <=0.25 SENSITIVE Sensitive     NITROFURANTOIN 128 RESISTANT Resistant     TRIMETH/SULFA <=20 SENSITIVE Sensitive     AMPICILLIN/SULBACTAM <=2 SENSITIVE Sensitive     PIP/TAZO <=4 SENSITIVE Sensitive     * >=100,000 COLONIES/mL KLEBSIELLA PNEUMONIAE  MRSA PCR Screening     Status: None   Collection Time: 12/02/19  4:05 AM   Specimen: Nasal Mucosa; Nasopharyngeal  Result Value Ref Range Status   MRSA by PCR NEGATIVE NEGATIVE Final    Comment:        The GeneXpert MRSA Assay (FDA approved for NASAL specimens only), is one component of a comprehensive MRSA colonization surveillance program. It is not intended to diagnose MRSA infection nor to guide or monitor treatment for MRSA infections. Performed at [facility], [place] [address] [place], [place] [address]          Radiology Studies: No results found.      Scheduled Meds: . amiodarone  100 mg Oral Daily  . apixaban  5 mg Oral BID  . atorvastatin  40 mg Oral q1800  . budesonide (PULMICORT) nebulizer solution  0.25 mg Nebulization BID  . cefdinir  300 mg Oral Q12H  . clopidogrel  75 mg Oral Daily  . dextromethorphan-guaiFENesin  1 tablet Oral BID  . folic acid  1 mg Oral Daily  . furosemide  60 mg Intravenous BID  . insulin aspart  0-15 Units Subcutaneous TID WC  . insulin aspart  0-5 Units Subcutaneous QHS  . insulin aspart  15 Units Subcutaneous TID WC  . insulin glargine  44 Units Subcutaneous QHS  . ipratropium-albuterol  3 mL Nebulization BID  . levothyroxine  50 mcg Oral Daily  . multivitamin with minerals  1 tablet Oral Daily  . nicotine  21 mg Transdermal Daily  . nortriptyline  20 mg Oral QHS  . pantoprazole  40 mg Oral Daily  . predniSONE  60 mg Oral QAC breakfast  . sodium chloride flush  3 mL Intravenous Q12H  .  spironolactone  12.5 mg Oral Daily  . thiamine  100 mg Oral Daily   Continuous Infusions: . sodium chloride 250 mL (12/06/19 1643)     LOS: 7 days    Time spent: 35 minutes    Irine Seal, MD [facility]  If 7PM-7AM, please contact night-coverage [email or web address] 12/08/2019, 10:08 AM

## 2019-12-08 NOTE — Progress Notes (Addendum)
Patient ID: [name], female   DOB: 1961/09/20, 59 y.o.   MRN: [id number]     Advanced Heart Failure Rounding Note  PCP-Cardiologist: Quay Burow, MD   Subjective:    Good diuresis yesterday, weight down another pound. Now 226 pounds (103 kg) which is her baseline per previous weights in charts  Breathing better. Nearing baseline. No orthopnea or PND.   Echo: EF 50%, anterolateral hypokinesis, normal-appearing RV, PASP 60 mmHg.    Objective:   Weight Range: 102.7 kg Body mass index is 38.88 kg/m.   Vital Signs:   Temp:  [97.9 F (36.6 C)-98.2 F (36.8 C)] 98.2 F (36.8 C) (01/24 0757) Pulse Rate:  [74-93] 81 (01/24 0757) Resp:  [13-24] 15 (01/24 0757) BP: (119-153)/(72-100) 153/76 (01/24 0524) SpO2:  [91 %-100 %] 96 % (01/24 0845) Weight:  [102.7 kg] 102.7 kg (01/24 0500) Last BM Date: 12/06/19  Weight change: Filed Weights   12/06/19 0245 12/07/19 0055 12/08/19 0500  Weight: 104.1 kg 103 kg 102.7 kg    Intake/Output:   Intake/Output Summary (Last 24 hours) at 12/08/2019 1111 Last data filed at 12/08/2019 0903 Gross per 24 hour  Intake 1290 ml  Output 650 ml  Net 640 ml      Physical Exam    General:  Sitting in chair. No resp difficulty HEENT: Normal Neck: supple. JVP hard to see. Does not look markedly elevated Carotids 2+ bilat; no bruits. No lymphadenopathy or thryomegaly appreciated. Cor: PMI nondisplaced. Regular rate & rhythm. No rubs, gallops or murmurs. Lungs: clear Abdomen: obese soft, nontender, nondistended. No hepatosplenomegaly. No bruits or masses. Good bowel sounds. Extremities: no cyanosis, clubbing, rash, UNNA boots trace edema Neuro: alert & orientedx3, cranial nerves grossly intact. moves all 4 extremities w/o difficulty. Affect pleasant   Telemetry   NSR 70-80s, personally reviewed  Labs    CBC Recent Labs    12/07/19 0542 12/08/19 0531  WBC 13.9* 16.0*  NEUTROABS 12.3*  --   HGB 9.9* 10.4*  HCT 32.1* 33.7*  MCV  87.7 88.0  PLT 331 [id number]   Basic Metabolic Panel Recent Labs    12/07/19 0542 12/08/19 0531  NA 137 137  K 4.2 3.5  CL 95* 97*  CO2 30 29  GLUCOSE 244* 88  BUN 54* 50*  CREATININE 1.27* 1.15*  CALCIUM 8.5* 8.6*   Liver Function Tests No results for input(s): AST, ALT, ALKPHOS, BILITOT, PROT, ALBUMIN in the last 72 hours. No results for input(s): LIPASE, AMYLASE in the last 72 hours. Cardiac Enzymes No results for input(s): CKTOTAL, CKMB, CKMBINDEX, TROPONINI in the last 72 hours.  BNP: BNP (last 3 results) Recent Labs    12/01/19 1837  BNP 938.7*    ProBNP (last 3 results) No results for input(s): PROBNP in the last 8760 hours.   D-Dimer No results for input(s): DDIMER in the last 72 hours. Hemoglobin A1C No results for input(s): HGBA1C in the last 72 hours. Fasting Lipid Panel No results for input(s): CHOL, HDL, LDLCALC, TRIG, CHOLHDL, LDLDIRECT in the last 72 hours. Thyroid Function Tests No results for input(s): TSH, T4TOTAL, T3FREE, THYROIDAB in the last 72 hours.  Invalid input(s): FREET3  Other results:   Imaging    No results found.   Medications:     Scheduled Medications: . amiodarone  100 mg Oral Daily  . apixaban  5 mg Oral BID  . atorvastatin  40 mg Oral q1800  . budesonide (PULMICORT) nebulizer solution  0.25 mg Nebulization BID  .  cefdinir  300 mg Oral Q12H  . clopidogrel  75 mg Oral Daily  . dextromethorphan-guaiFENesin  1 tablet Oral BID  . folic acid  1 mg Oral Daily  . furosemide  60 mg Intravenous BID  . insulin aspart  0-15 Units Subcutaneous TID WC  . insulin aspart  0-5 Units Subcutaneous QHS  . insulin aspart  15 Units Subcutaneous TID WC  . insulin glargine  44 Units Subcutaneous QHS  . ipratropium-albuterol  3 mL Nebulization BID  . levothyroxine  50 mcg Oral Daily  . multivitamin with minerals  1 tablet Oral Daily  . nicotine  21 mg Transdermal Daily  . nortriptyline  20 mg Oral QHS  . pantoprazole  40 mg Oral Daily    . predniSONE  60 mg Oral QAC breakfast  . sodium chloride flush  3 mL Intravenous Q12H  . spironolactone  12.5 mg Oral Daily  . thiamine  100 mg Oral Daily    Infusions: . sodium chloride 250 mL (12/06/19 1643)    PRN Medications: sodium chloride, acetaminophen, albuterol, ipratropium-albuterol, polyethylene glycol, sodium chloride flush    Assessment/Plan   1. Acute on chronic diastolic CHF: Has been presumed nonischemic given LHC in 12/15 showing only 80% stenosis in small OM1.  Echo 10/2018 EF 45-50% Grade II DD.  Echo this admission with EF 50% but PASP 60 mmHg. She has been diuresing well for the last couple days. Weight now at baseline (103kg) - Can switch back to home regimen.  - Continue spironolactone 12.5 mg daily.  - Likely will proceed with Cardiomems implant after she recovers from the current COPD/CHF exacerbation. Will try to arrange after discharge.   2. Atrial fibrillation: Paroxysmal. She is in NSR.   - Continue amiodarone 100 mg daily. - Continue Eliquis 5 mg twice a day. No bleeding 3. PAD: Claudication on left.  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 However, angiography in 8/20 showed left SFA occlusion. Dr. Gwenlyn Found wanted her to be seen by [facility].  - She has been on Plavix in addition to Eliquis.   4. AECOPD: Much improved with steroids, abx, nebs and diuresis.   - Per primary service.  - Quit smoking.   5. H/o cirrhosis:  6. OSA: - Unable to tolerate CPAP.  7. CKD stage III:  Creatinine stable today at 11.5 8. HTN:  BP high. Restart amlodipine 9. Bradycardia:  - Junctional rhythm when hospitalized in 3/17. 10. DM2 - with CAD/PAD and CKD. Strongly consider SGLT2i at d/c   [facility] for d/c today from our standpoint  HF meds at d/c:  Torsemide 40 bid Bisoprolol 2.5 (was on 5) Amio 100 daily Spiro 12.5 (new) Plavix 75 Apixaban 5 bid Amlodipine 10 Atorva 40   Consider Jardiance 10 (if  urinary hygiene acceptable)    Wil larrange f/u in HF Clinic and possible Cardiomems implant.    Length of Stay: [place], MD  12/08/2019, 11:11 AM  Advanced Heart Failure Team Pager [phone] (M-F; [place])  Please contact [facility] for night-coverage after hours (4p -7a ) and weekends on [email or web address]

## 2019-12-09 DIAGNOSIS — E1165 Type 2 diabetes mellitus with hyperglycemia: Secondary | ICD-10-CM

## 2019-12-09 LAB — CBC
HCT: 35.4 % — ABNORMAL LOW (ref 36.0–46.0)
Hemoglobin: 10.7 g/dL — ABNORMAL LOW (ref 12.0–15.0)
MCH: 26.9 pg (ref 26.0–34.0)
MCHC: 30.2 g/dL (ref 30.0–36.0)
MCV: 88.9 fL (ref 80.0–100.0)
Platelets: 380 10*3/uL (ref 150–400)
RBC: 3.98 MIL/uL (ref 3.87–5.11)
RDW: 15.3 % (ref 11.5–15.5)
WBC: 16.4 10*3/uL — ABNORMAL HIGH (ref 4.0–10.5)
nRBC: 0 % (ref 0.0–0.2)

## 2019-12-09 LAB — BASIC METABOLIC PANEL
Anion gap: 14 (ref 5–15)
BUN: 49 mg/dL — ABNORMAL HIGH (ref 6–20)
CO2: 29 mmol/L (ref 22–32)
Calcium: 8.5 mg/dL — ABNORMAL LOW (ref 8.9–10.3)
Chloride: 96 mmol/L — ABNORMAL LOW (ref 98–111)
Creatinine, Ser: 1.29 mg/dL — ABNORMAL HIGH (ref 0.44–1.00)
GFR calc Af Amer: 53 mL/min — ABNORMAL LOW (ref >60)
GFR calc non Af Amer: 46 mL/min — ABNORMAL LOW (ref >60)
Glucose, Bld: 169 mg/dL — ABNORMAL HIGH (ref 70–99)
Potassium: 3.6 mmol/L (ref 3.5–5.1)
Sodium: 139 mmol/L (ref 135–145)

## 2019-12-09 LAB — GLUCOSE, CAPILLARY
Glucose-Capillary: 153 mg/dL — ABNORMAL HIGH (ref 70–99)
Glucose-Capillary: 258 mg/dL — ABNORMAL HIGH (ref 70–99)

## 2019-12-09 MED ORDER — BUDESONIDE-FORMOTEROL FUMARATE 160-4.5 MCG/ACT IN AERO: 2.0000 | INHALATION_SPRAY | Freq: Two times a day (BID) | RESPIRATORY_TRACT | 2 refills | Status: DC

## 2019-12-09 MED ORDER — AMLODIPINE BESYLATE 10 MG PO TABS: 10.0000 mg | ORAL_TABLET | Freq: Every day | ORAL | 1 refills | Status: DC

## 2019-12-09 MED ORDER — TORSEMIDE 20 MG PO TABS: 40.0000 mg | ORAL_TABLET | Freq: Two times a day (BID) | ORAL | 1 refills | Status: DC

## 2019-12-09 MED ORDER — LANTUS SOLOSTAR 100 UNIT/ML ~~LOC~~ SOPN: 40.0000 [IU] | PEN_INJECTOR | Freq: Every day | SUBCUTANEOUS | Status: DC

## 2019-12-09 MED ORDER — SPIRIVA HANDIHALER 18 MCG IN CAPS: 18.0000 ug | ORAL_CAPSULE | Freq: Every morning | RESPIRATORY_TRACT | 2 refills | Status: DC

## 2019-12-09 MED ORDER — PREDNISONE 20 MG PO TABS: 20.0000 mg | ORAL_TABLET | Freq: Every day | ORAL | 0 refills | Status: DC

## 2019-12-09 MED ORDER — AMIODARONE HCL 100 MG PO TABS: 100.0000 mg | ORAL_TABLET | Freq: Every day | ORAL | 1 refills | Status: DC

## 2019-12-09 MED ORDER — SPIRONOLACTONE 25 MG PO TABS: 12.5000 mg | ORAL_TABLET | Freq: Every day | ORAL | 1 refills | Status: DC

## 2019-12-09 MED ORDER — BISOPROLOL FUMARATE 5 MG PO TABS: 2.5000 mg | ORAL_TABLET | Freq: Every day | ORAL | 1 refills | Status: DC

## 2019-12-09 MED ORDER — NICOTINE 21 MG/24HR TD PT24: 21.0000 mg | MEDICATED_PATCH | Freq: Every day | TRANSDERMAL | 0 refills | Status: DC

## 2019-12-09 NOTE — Progress Notes (Signed)
Physical Therapy Treatment Patient Details Name: [name] MRN: [id number] DOB: 02-14-1961 Today's Date: 12/09/2019    History of Present Illness Patient is a 59 y/o female who presents with SOB. Admitted with acute on chronic respiratory failure secondary to CHF exacerbation. CXR-showing hazy bilateral airspace opacities and small bilateral pleural effusions concerning for volume overload. PMH includes DM, tobacco abuse, smoker, CAD, PVD s/p stent, Afib, COPD, Cirrhosis, OSA, CKD, HTN, chronic combined systolic and diastolic CHF and cocaine, cocaine use/polysubstance abuse.    PT Comments    Patient continues to make progress toward PT goals. SpO2 >90% on RA during session. Current plan remains appropriate.    Follow Up Recommendations  Home health PT;Supervision - Intermittent     Equipment Recommendations  None recommended by PT    Recommendations for Other Services       Precautions / Restrictions Precautions Precautions: Fall Precaution Comments: watch O2 , wears an insert to help with feet pain Restrictions Weight Bearing Restrictions: No    Mobility  Bed Mobility               General bed mobility comments: OOB in chair upon arrival  Transfers Overall transfer level: Modified independent Equipment used: Rolling walker (2 wheeled) Transfers: Sit to/from Stand           General transfer comment: use of RW upon standing  Ambulation/Gait Ambulation/Gait assistance: Supervision Gait Distance (Feet): 200 Feet Assistive device: Rolling walker (2 wheeled) Gait Pattern/deviations: Step-through pattern;Decreased stride length;Trunk flexed Gait velocity: decreased   General Gait Details: cues for upright posture and maintaining safe proximity to RW when turning; distance limited by painful feet   Stairs             Wheelchair Mobility    Modified Rankin (Stroke Patients Only)       Balance Overall balance assessment: Needs  assistance;History of Falls Sitting-balance support: Feet supported;No upper extremity supported Sitting balance-Leahy Scale: Good     Standing balance support: Bilateral upper extremity supported;During functional activity Standing balance-Leahy Scale: Fair Standing balance comment: able to static stand without UE support                             Cognition Arousal/Alertness: Awake/alert Behavior During Therapy: WFL for tasks assessed/performed;Impulsive Overall Cognitive Status: Within Functional Limits for tasks assessed                                        Exercises      General Comments General comments (skin integrity, edema, etc.): SpO2 >90% on RA       Pertinent Vitals/Pain Pain Assessment: Faces Faces Pain Scale: Hurts little more Pain Location: feet Pain Descriptors / Indicators: Discomfort;Cramping;Pins and needles Pain Intervention(s): Monitored during session;Limited activity within patient's tolerance    Home Living                      Prior Function            PT Goals (current goals can now be found in the care plan section) Progress towards PT goals: Progressing toward goals    Frequency    Min 3X/week      PT Plan Current plan remains appropriate    Co-evaluation              AM-PAC PT "  6 Clicks" Mobility   Outcome Measure  Help needed turning from your back to your side while in a flat bed without using bedrails?: None Help needed moving from lying on your back to sitting on the side of a flat bed without using bedrails?: A Little Help needed moving to and from a bed to a chair (including a wheelchair)?: A Little Help needed standing up from a chair using your arms (e.g., wheelchair or bedside chair)?: None Help needed to walk in hospital room?: None Help needed climbing 3-5 steps with a railing? : A Little 6 Click Score: 21    End of Session   Activity Tolerance: Patient tolerated  treatment well Patient left: in chair;with call bell/phone within reach Nurse Communication: Mobility status PT Visit Diagnosis: Unsteadiness on feet (R26.81);Muscle weakness (generalized) (M62.81)     Time: [id number] PT Time Calculation (min) (ACUTE ONLY): 19 min  Charges:  $Gait Training: 8-22 mins                     Earney Navy, PTA Acute Rehabilitation Services Pager: [phone] Office: [phone]     Darliss Cheney 12/09/2019, 9:43 AM

## 2019-12-09 NOTE — Discharge Summary (Signed)
Physician Discharge Summary  [name] JTT:[id number] DOB: 1961/07/06 DOA: 12/01/2019  PCP: Tsosie Billing, MD (Inactive)  Admit date: 12/01/2019 Discharge date: 12/09/2019  Time spent: 55 minutes  Recommendations for Outpatient Follow-up:  1. Patient be discharged home with home health services. 2. Follow-up with Tsosie Billing, MD (Inactive) in 2 weeks.  On follow-up patient will need a basic metabolic profile done to follow-up on electrolytes and renal function.  Patient's diabetes will need to be reassessed on follow-up.  Patient COPD also need to be reassessed. 3. Follow-up with Dr. Aundra Dubin, advanced heart failure service in 1 to 2 weeks.  On follow-up patient will need to be evaluated/set up for [facility]. 4. Follow-up with Dr. Gwenlyn Found, cardiology as scheduled.   Discharge Diagnoses:  Principal Problem:   Acute respiratory failure with hypoxia ([name]) Active Problems:   Acute exacerbation of CHF (congestive heart failure) ([name])   COPD with acute exacerbation ([name])   DM (diabetes mellitus), type 2 with peripheral vascular complications ([name])   Hypothyroidism   PAF (paroxysmal atrial fibrillation) ([name])   Essential hypertension   CKD (chronic kidney disease), stage III ([place])   AKI (acute kidney injury) ([place])   Acute renal failure ([place])   Discharge Condition: Stable and improved  Diet recommendation: Heart healthy/carb modified  Filed Weights   12/07/19 0055 12/08/19 0500 12/09/19 0433  Weight: 103 kg 102.7 kg 101.5 kg    History of present illness:  HPI per Dr. Annamary [name] [name] is a 59 y.o. female with medical history significant of alcoholic liver cirrhosis, CAD, carotid artery disease, chronic combined systolic and diastolic congestive heart failure, COPD, PAD, hypertension, hyperlipidemia, hypothyroidism, paroxysmal atrial fibrillation, type 2 diabetes, OSA not on CPAP, cocaine use/polysubstance abuse presenting to the ED via EMS for evaluation  of shortness of breath.  Family called EMS because patient had been getting more lethargic over the last several days and having labored breathing. Patient reported 3-day history of dyspnea and orthopnea.  She was coughing up clear sputum.  Her legs were swollen.  Stated she takes her home medications but was not able to tell which medication she takes.  Stated she uses 1 inhaler for her COPD.  Denied chest pain.  Denied fevers, chills, nausea, vomiting, abdominal pain, or diarrhea.  No other complaints.  ED Course: Afebrile and hemodynamically stable.  Not hypoxic.  Patient appeared to be in respiratory distress on arrival to the ED.  She was wheezing.  Labs showing no leukocytosis.  Hemoglobin 8.5, at baseline.  BNP elevated at 938.  Creatinine 1.8, baseline 1.2-1.3.  AST 60, ALT 83, alk phos 438, T bili 0.7.  High-sensitivity troponin 22.  It is reported in the chart that initial ABG showing pH 7.45, PCO2 37, and PO2 37.  I spoke to respiratory and they confirm that this is an error, this was a VBG.  Chest x-ray showing hazy bilateral airspace opacities and small bilateral pleural effusions concerning for volume overload.  SARS-CoV-2 PCR test negative. Received albuterol continuous neb treatment, IV Solu-Medrol 125 mg, and IV Lasix 40 mg.  Hospital Course:  1 acute hypoxemic respiratory failure secondary to acute on chronic diastolic CHF and acute COPD exacerbation Patient presented with worsening shortness of breath and orthopnea.  Respiratory viral panel was negative.  SARS covid virus 2 PCR negative.  Chest x-ray concerning for volume overload with interstitial lung markings, bilateral pleural effusion concerning for congestive heart failure.  Clinical improvement.   Patient was on IV Lasix  with good diuresis during the hospitalization.  2D echo done with EF of 41%, grade 3 diastolic dysfunction, left ventricular anterior lateral hypokinesis/regional wall motion abnormalities.    Patient was also  placed on IV Solu-Medrol which was tapered down to oral prednisone, Pulmicort, Mucinex, scheduled duo nebs, antibiotics.  Patient improved clinically and was transitioned to oral torsemide as well as oral prednisone taper.  Patient was satting on room air with sats of 98 to 100% by day of discharge.  Patient be discharged home in stable and improved condition on cardiac regimen as recommended per cardiology as well as a steroid taper, Symbicort, Spiriva, albuterol MDI as needed.  Outpatient follow-up with PCP and cardiology.   2.  Acute on chronic systolic and diastolic CHF exacerbation Patient presented with dyspnea and orthopnea.  Chest x-ray consistent with volume overload and bilateral pleural effusions.  2D echo done with EF of 42%, grade 3 diastolic dysfunction, left ventricular wall motion abnormalities.  Patient improving clinically daily.  Patient was placed on IV Lasix and diuresed well during the hospitalization.  IV Lasix dose was adjusted by cardiology and patient was followed by cardiology during the hospitalization.  Patient was -7.985 L by day of discharge and weight was down to 223.7 pounds from 226.5 pounds from 227.1 pounds from 229.5 pounds from 232.4 pounds from 322.5 pounds from 228.84 pounds on admission.  IV Lasix dose was increased to 60 mg every 12 hours per cardiology.  Spironolactone added to cardiac regimen per cardiology.  Patient was seen by the heart failure team.  Patient maintained on home regimen of statin.  Beta-blocker was held during the hospitalization due to acute CHF decompensation however will be resumed on discharge at a lower dose of 2.5 mg daily per cardiology recommendations.  As patient improved clinically patient was subsequently transition back to home regimen of oral torsemide. Cardiology consulted and has reassessed 2D echo and feels no significant change from previous study with evidence of increased left atrial and right atrial filling pressures with slight  worsening of pulmonary hypertension.  Patient improved clinically and was back to baseline by day of discharge.  Patient be discharged home in stable and improved condition and will follow up with cardiology/heart failure team in the outpatient setting.  3.  Acute kidney injury on chronic kidney disease stage IIIb Baseline creatinine 1.2-1.4.  Creatinine improved with diuresis and by day of discharge patient's renal function was back to baseline with a creatinine of 1.29.  Outpatient follow-up.   4.  Elevated troponin Patient noted to have a slightly elevated troponin that was trending down felt secondary to demand ischemia secondary to problem #1.  2D echo however with wall motion abnormalities, grade 3 diastolic dysfunction, EF of 50%.    Patient was seen by cardiology and followed by cardiology during the hospitalization.  Outpatient follow-up.   5.  Insulin-dependent diabetes mellitus Hemoglobin A1c of 7.3.  Elevated CBG during the hospitalization was secondary to steroids.    Patient was on IV Solu-Medrol and subsequently transition to oral prednisone taper with improvement with blood glucose levels.  Patient was on Lantus and dose adjusted as well as meal coverage insulin.  Patient will be discharged back home on home regimen of insulin.  Outpatient follow-up.   6.  Hypothyroidism Patient maintained on home regimen of Synthroid.   7.  Paroxysmal A. fib Patient was in sinus rhythm during the hospitalization.  Patient maintained on home regimen of amiodarone.  Bisoprolol was held and will  be resumed on discharge.  Patient was on Eliquis for anticoagulation.  Patient was followed by cardiology during the hospitalization.    8.  Hyperlipidemia Patient maintained on a statin.   9.  Coronary artery disease Patient maintained on Plavix, Lipitor, Eliquis.  Spironolactone was added to regimen per cardiology during the hospitalization.  Patient maintained on home regimen of amiodarone.   Patient was followed by cardiology.  Outpatient follow-up.   10.  History of alcohol abuse No signs of withdrawal during the hospitalization.  Patient was maintained on the Ativan withdrawal protocol.  11.  Tobacco abuse Tobacco cessation stressed to patient.    Patient maintained on a nicotine patch.  12.  Klebsiella pneumonia UTI. Patient started on doxycycline which has subsequently been discontinued and patient placed on IV Rocephin and subsequently transition to oral Omnicef.  Patient completed a course of antibiotic treatment during the hospitalization.  No further antibiotics needed on discharge.  Outpatient follow-up.    46.  Hypertension Patient initially was on diuretics and spironolactone added to regimen.  Norvasc was resumed.  Patient's bisoprolol was held during the hospitalization and will be resumed on discharge.  Blood pressure was controlled by day of discharge. Outpatient follow-up.    Procedures:  Chest x-ray 12/01/2019  2D echo 12/02/2019  Consultations:  Cardiology: Dr. Sallyanne Kuster 12/04/2019  Dr. Aundra Dubin advanced heart failure service  Discharge Exam: Vitals:   12/09/19 0743 12/09/19 0839  BP: (!) 142/73   Pulse: 89 92  Resp: 18 12  Temp: 98.7 F (37.1 C)   SpO2: 98% 100%    General: NAD Cardiovascular: RRR Respiratory: CTAB  Discharge Instructions   Discharge Instructions    Diet - low sodium heart healthy   Complete by: As directed    Diet Carb Modified   Complete by: As directed    Increase activity slowly   Complete by: As directed      Allergies as of 12/09/2019      Reactions   Gabapentin Nausea And Vomiting, Other (See Comments)   POSSIBLE SHAKING? TAKING MED CURRENTLY   Lyrica [pregabalin] Other (See Comments)   Shaking Pt still taking lyrica--"probably will stop taking"       Medication List    TAKE these medications   acetaminophen 500 MG tablet Commonly known as: TYLENOL Take 2 tablets (1,000 mg total) by mouth every 6  (six) hours as needed for mild pain.   albuterol 108 (90 Base) MCG/ACT inhaler Commonly known as: VENTOLIN HFA Inhale 2 puffs into the lungs every 6 (six) hours as needed for wheezing or shortness of breath.   amiodarone 100 MG tablet Commonly known as: PACERONE Take 1 tablet (100 mg total) by mouth daily. What changed: Another medication with the same name was removed. Continue taking this medication, and follow the directions you see here.   amLODipine 10 MG tablet Commonly known as: NORVASC Take 1 tablet (10 mg total) by mouth daily. What changed: See the new instructions.   apixaban 5 MG Tabs tablet Commonly known as: ELIQUIS Take 1 tablet (5 mg total) by mouth 2 (two) times daily.   atorvastatin 40 MG tablet Commonly known as: LIPITOR TAKE 1 TABLET BY MOUTH ONCE EVERY EVENING What changed:   how much to take  how to take this  when to take this  additional instructions   bisoprolol 5 MG tablet Commonly known as: ZEBETA Take 0.5 tablets (2.5 mg total) by mouth daily. What changed: See the new instructions.   budesonide-formoterol  160-4.5 MCG/ACT inhaler Commonly known as: Symbicort Inhale 2 puffs into the lungs 2 (two) times daily.   cholecalciferol 1000 units tablet Commonly known as: VITAMIN D Take 1 tablet (1,000 Units total) by mouth daily.   clopidogrel 75 MG tablet Commonly known as: PLAVIX Take 1 tablet (75 mg total) by mouth daily.   colchicine 0.6 MG tablet Take 0.6-1.2 mg by mouth See admin instructions. Take 1.22m by mouth initially at onset of gout flare up, then take 0.660min one hour, then stop.   docusate sodium 100 MG capsule Commonly known as: COLACE Take 1 capsule (100 mg total) by mouth daily.   folic acid 1 MG tablet Commonly known as: FOLVITE Take 1 tablet (1 mg total) by mouth daily.   HumaLOG KwikPen 200 UNIT/ML Sopn Generic drug: Insulin Lispro Inject 8 Units into the skin 3 (three) times daily with meals. What changed: how  much to take   Lantus SoloStar 100 UNIT/ML Solostar Pen Generic drug: Insulin Glargine Inject 40 Units into the skin at bedtime.   levothyroxine 50 MCG tablet Commonly known as: SYNTHROID Take 1 tablet (50 mcg total) by mouth daily.   multivitamin with minerals Tabs tablet Take 1 tablet by mouth daily.   nicotine 21 mg/24hr patch Commonly known as: NICODERM CQ - dosed in mg/24 hours Place 1 patch (21 mg total) onto the skin daily. Start taking on: December 10, 2019   nortriptyline 50 MG capsule Commonly known as: PAMELOR Take 50 mg by mouth at bedtime.   pantoprazole 40 MG tablet Commonly known as: PROTONIX Take 1 tablet (40 mg total) by mouth daily.   predniSONE 20 MG tablet Commonly known as: DELTASONE Take 1-2 tablets (20-40 mg total) by mouth daily before breakfast. Take 2 tablets (4048mdaily x3 days, then 1 tablet (70m54maily x3 days then stop. Start taking on: December 10, 2019   Prolensa 0.07 % Soln Generic drug: Bromfenac Sodium Place 1 drop into the right eye daily.   senna-docusate 8.6-50 MG tablet Commonly known as: Senokot-S Take 2 tablets by mouth at bedtime as needed for mild constipation.   Spiriva HandiHaler 18 MCG inhalation capsule Generic drug: tiotropium Place 1 capsule (18 mcg total) into inhaler and inhale every morning.   spironolactone 25 MG tablet Commonly known as: ALDACTONE Take 0.5 tablets (12.5 mg total) by mouth daily. Start taking on: December 10, 2019   thiamine 100 MG tablet Take 1 tablet (100 mg total) by mouth daily.   torsemide 20 MG tablet Commonly known as: DEMADEX Take 2 tablets (40 mg total) by mouth 2 (two) times daily. What changed: See the new instructions.      Allergies  Allergen Reactions  . Gabapentin Nausea And Vomiting and Other (See Comments)    POSSIBLE SHAKING? TAKING MED CURRENTLY  . Lyrica [Pregabalin] Other (See Comments)    Shaking Pt still taking lyrica--"probably will stop taking"    Follow-up  Information    [facility]   Specialty: Home Health Services Why: HHPT/HHOT Contact information: [phone]        RichTsosie Billing. Schedule an appointment as soon as possible for a visit in 2 week(s).   Specialty: Internal Medicine Contact information: [phone]    BerrLorretta Harp .   Specialties: Cardiology, Radiology Contact information: [phone]  Larey Dresser, MD. Schedule an appointment as soon as possible for a visit in 1 week(s).   Specialty: Cardiology Why: f/u in 1-2 weeks at advanced HF clinic Contact information: [address] [place] [address] [phone]            The results of significant diagnostics from this hospitalization (including imaging, microbiology, ancillary and laboratory) are listed below for reference.    Significant Diagnostic Studies: DG Chest Port 1 View  Result Date: 12/01/2019 CLINICAL DATA:  Lethargy EXAM: PORTABLE CHEST 1 VIEW COMPARISON:  February 19, 2019 FINDINGS: There are hazy bilateral airspace opacities, greatest at the lung bases. There are slightly prominent interstitial lung markings. There appear to be small bilateral pleural effusions, right greater than left. The heart size is enlarged. There is no pneumothorax. IMPRESSION: Overall findings concerning for congestive heart failure. A superimposed atypical infectious process is difficult to exclude. Electronically Signed   By: Constance Holster M.D.   On: 12/01/2019 20:59   ECHOCARDIOGRAM COMPLETE  Result Date: 12/02/2019   ECHOCARDIOGRAM REPORT   Patient Name:   [name] Date of Exam: 12/02/2019 Medical Rec #:  [id number]     Height:       64.0 in Accession #:    [id number]    Weight:       228.8 lb Date of Birth:  01/03/1961    BSA:           2.07 m Patient Age:    80 years      BP:           132/63 mmHg Patient Gender: F             HR:           82 bpm. Exam Location:  Inpatient Procedure: 2D Echo Indications:    CHF-Acute Systolic 248.25/O03.70  History:        Patient has prior history of Echocardiogram examinations, most                 recent 10/22/2018. CHF, CAD, COPD, Arrythmias:Atrial                 Fibrillation; Risk Factors:Hypertension, Diabetes, Current                 Smoker and Sleep Apnea. CKD. Polysubstance abuse.  Sonographer:    Clayton Lefort RDCS (AE) Referring Phys: [id number] [facility]  Sonographer Comments: Suboptimal parasternal window. Image acquisition challenging due to respiratory motion. On BiPAP. IMPRESSIONS  1. Left ventricular ejection fraction, by visual estimation, is 50%. The left ventricle has mildly decreased function. There is mildly increased left ventricular hypertrophy.  2. Left ventricular diastolic parameters are consistent with Grade III diastolic dysfunction (restrictive).  3. Mildly dilated left ventricular internal cavity size.  4. The left ventricle demonstrates regional wall motion abnormalities. Anterolateral hypokinesis.  5. Global right ventricle has normal systolic function.The right ventricular size is normal. No increase in right ventricular wall thickness.  6. Left atrial size was mildly dilated.  7. Right atrial size was mildly dilated.  8. Mild mitral annular calcification.  9. The mitral valve is normal in structure. Mild mitral valve regurgitation. No evidence of mitral stenosis. 10. The tricuspid valve is normal in structure. 11. The aortic valve is tricuspid. Aortic valve regurgitation is not visualized. Mild aortic valve sclerosis without stenosis. 12. The inferior vena cava is dilated in size with <50% respiratory variability, suggesting right atrial pressure of 15  mmHg. 13. The tricuspid regurgitant velocity is 3.35 m/s, and with an assumed right atrial pressure of 15 mmHg, the estimated  right ventricular systolic pressure is moderately elevated at 59.9 mmHg. FINDINGS  Left Ventricle: Left ventricular ejection fraction, by visual estimation, is 50%. The left ventricle has mildly decreased function. The left ventricle demonstrates regional wall motion abnormalities. The left ventricular internal cavity size was mildly dilated left ventricle. There is mildly increased left ventricular hypertrophy. Left ventricular diastolic parameters are consistent with Grade III diastolic dysfunction (restrictive). Right Ventricle: The right ventricular size is normal. No increase in right ventricular wall thickness. Global RV systolic function is has normal systolic function. The tricuspid regurgitant velocity is 3.35 m/s, and with an assumed right atrial pressure  of 15 mmHg, the estimated right ventricular systolic pressure is moderately elevated at 59.9 mmHg. Left Atrium: Left atrial size was mildly dilated. Right Atrium: Right atrial size was mildly dilated Pericardium: There is no evidence of pericardial effusion. Mitral Valve: The mitral valve is normal in structure. There is mild calcification of the mitral valve leaflet(s). Mild mitral annular calcification. Mild mitral valve regurgitation. No evidence of mitral valve stenosis by observation. MV peak gradient, 9.2 mmHg. Tricuspid Valve: The tricuspid valve is normal in structure. Tricuspid valve regurgitation is trivial. Aortic Valve: The aortic valve is tricuspid. Aortic valve regurgitation is not visualized. Mild aortic valve sclerosis is present, with no evidence of aortic valve stenosis. Aortic valve mean gradient measures 5.0 mmHg. Aortic valve peak gradient measures 9.2 mmHg. Aortic valve area, by VTI measures 2.95 cm. Pulmonic Valve: The pulmonic valve was normal in structure. Pulmonic valve regurgitation is not visualized. Pulmonic regurgitation is not visualized. Aorta: The aortic root is normal in size and structure. Venous: The inferior vena  cava is dilated in size with less than 50% respiratory variability, suggesting right atrial pressure of 15 mmHg. IAS/Shunts: No atrial level shunt detected by color flow Doppler.  LEFT VENTRICLE PLAX 2D LVIDd:         5.70 cm       Diastology LVIDs:         4.80 cm       LV e' lateral:   8.92 cm/s LV PW:         1.30 cm       LV E/e' lateral: 17.6 LV IVS:        1.30 cm       LV e' medial:    5.98 cm/s LVOT diam:     2.10 cm       LV E/e' medial:  26.3 LV SV:         53 ml LV SV Index:   23.69 LVOT Area:     3.46 cm  LV Volumes (MOD) LV area d, A2C:    40.40 cm LV area d, A4C:    41.70 cm LV area s, A2C:    29.50 cm LV area s, A4C:    29.20 cm LV major d, A2C:   9.24 cm LV major d, A4C:   8.88 cm LV major s, A2C:   8.61 cm LV major s, A4C:   8.00 cm LV vol d, MOD A2C: 145.0 ml LV vol d, MOD A4C: 160.0 ml LV vol s, MOD A2C: 84.0 ml LV vol s, MOD A4C: 89.6 ml LV SV MOD A2C:     61.0 ml LV SV MOD A4C:     160.0 ml LV SV MOD BP:  65.8 ml RIGHT VENTRICLE             IVC RV Basal diam:  3.30 cm     IVC diam: 2.30 cm RV S prime:     12.00 cm/s TAPSE (M-mode): 2.2 cm LEFT ATRIUM             Index       RIGHT ATRIUM           Index LA diam:        4.30 cm 2.08 cm/m  RA Area:     19.20 cm LA Vol (A2C):   96.7 ml 46.68 ml/m RA Volume:   54.20 ml  26.16 ml/m LA Vol (A4C):   65.6 ml 31.67 ml/m LA Biplane Vol: 79.9 ml 38.57 ml/m  AORTIC VALVE AV Area (Vmax):    2.87 cm AV Area (Vmean):   2.73 cm AV Area (VTI):     2.95 cm AV Vmax:           152.00 cm/s AV Vmean:          109.000 cm/s AV VTI:            0.343 m AV Peak Grad:      9.2 mmHg AV Mean Grad:      5.0 mmHg LVOT Vmax:         126.00 cm/s LVOT Vmean:        86.000 cm/s LVOT VTI:          0.292 m LVOT/AV VTI ratio: 0.85  AORTA Ao Root diam: 2.70 cm MITRAL VALVE                         TRICUSPID VALVE MV Area (PHT): 5.42 cm              TR Peak grad:   44.9 mmHg MV Peak grad:  9.2 mmHg              TR Vmax:        335.00 cm/s MV Mean grad:  3.0 mmHg MV  Vmax:       1.52 m/s              SHUNTS MV Vmean:      82.8 cm/s             Systemic VTI:  0.29 m MV VTI:        0.37 m                Systemic Diam: 2.10 cm MV PHT:        40.60 msec MV Decel Time: 140 msec MR Peak grad:    118.8 mmHg MR Mean grad:    79.0 mmHg MR Vmax:         545.00 cm/s MR Vmean:        425.0 cm/s MR PISA:         1.57 cm MR PISA Eff ROA: 9 mm MR PISA Radius:  0.50 cm MV E velocity: 157.00 cm/s 103 cm/s MV A velocity: 54.10 cm/s  70.3 cm/s MV E/A ratio:  2.90        1.5  Loralie Champagne MD Electronically signed by Loralie Champagne MD Signature Date/Time: 12/02/2019/11:22:18 AM    Final     Microbiology: Recent Results (from the past 240 hour(s))  Respiratory Panel by RT PCR (Flu A&B, Covid) - Nasopharyngeal Swab     Status: None   Collection Time: 12/01/19  6:37 PM   Specimen: Nasopharyngeal Swab  Result Value Ref Range Status   SARS Coronavirus 2 by RT PCR NEGATIVE NEGATIVE Final    Comment: (NOTE) SARS-CoV-2 target nucleic acids are NOT DETECTED. The SARS-CoV-2 RNA is generally detectable in upper respiratoy specimens during the acute phase of infection. The lowest concentration of SARS-CoV-2 viral copies this assay can detect is 131 copies/mL. A negative result does not preclude SARS-Cov-2 infection and should not be used as the sole basis for treatment or other patient management decisions. A negative result may occur with  improper specimen collection/handling, submission of specimen other than nasopharyngeal swab, presence of viral mutation(s) within the areas targeted by this assay, and inadequate number of viral copies (<131 copies/mL). A negative result must be combined with clinical observations, patient history, and epidemiological information. The expected result is Negative. Fact Sheet for Patients:  PinkCheek.be Fact Sheet for Healthcare Providers:  GravelBags.it This test is not yet ap proved or  cleared by the [place] FDA and  has been authorized for detection and/or diagnosis of SARS-CoV-2 by FDA under an Emergency Use Authorization (EUA). This EUA will remain  in effect (meaning this test can be used) for the duration of the COVID-19 declaration under Section 564(b)(1) of the Act, 21 U.S.C. section 360bbb-3(b)(1), unless the authorization is terminated or revoked sooner.    Influenza A by PCR NEGATIVE NEGATIVE Final   Influenza B by PCR NEGATIVE NEGATIVE Final    Comment: (NOTE) The Xpert Xpress SARS-CoV-2/FLU/RSV assay is intended as an aid in  the diagnosis of influenza from Nasopharyngeal swab specimens and  should not be used as a sole basis for treatment. Nasal washings and  aspirates are unacceptable for Xpert Xpress SARS-CoV-2/FLU/RSV  testing. Fact Sheet for Patients: PinkCheek.be Fact Sheet for Healthcare Providers: GravelBags.it This test is not yet approved or cleared by the [place] FDA and  has been authorized for detection and/or diagnosis of SARS-CoV-2 by  FDA under an Emergency Use Authorization (EUA). This EUA will remain  in effect (meaning this test can be used) for the duration of the  Covid-19 declaration under Section 564(b)(1) of the Act, 21  U.S.C. section 360bbb-3(b)(1), unless the authorization is  terminated or revoked. Performed at [facility], [place] [address] [place], [place] [address]   Urine culture     Status: Abnormal   Collection Time: 12/02/19 12:41 AM   Specimen: Urine, Clean Catch  Result Value Ref Range Status   Specimen Description URINE, CLEAN CATCH  Final   Special Requests   Final    NONE Performed at [facility], [place] [address] [place], [place] [address]    Culture >=100,000 COLONIES/mL KLEBSIELLA PNEUMONIAE (A)  Final   Report Status 12/04/2019 FINAL  Final   Organism ID, Bacteria KLEBSIELLA PNEUMONIAE (A)  Final      Susceptibility    Klebsiella pneumoniae - MIC*    AMPICILLIN RESISTANT Resistant     CEFAZOLIN <=4 SENSITIVE Sensitive     CEFTRIAXONE <=0.25 SENSITIVE Sensitive     CIPROFLOXACIN <=0.25 SENSITIVE Sensitive     GENTAMICIN <=1 SENSITIVE Sensitive     IMIPENEM <=0.25 SENSITIVE Sensitive     NITROFURANTOIN 128 RESISTANT Resistant     TRIMETH/SULFA <=20 SENSITIVE Sensitive     AMPICILLIN/SULBACTAM <=2 SENSITIVE Sensitive     PIP/TAZO <=4 SENSITIVE Sensitive     * >=100,000 COLONIES/mL KLEBSIELLA PNEUMONIAE  MRSA PCR Screening     Status: None   Collection  Time: 12/02/19  4:05 AM   Specimen: Nasal Mucosa; Nasopharyngeal  Result Value Ref Range Status   MRSA by PCR NEGATIVE NEGATIVE Final    Comment:        The GeneXpert MRSA Assay (FDA approved for NASAL specimens only), is one component of a comprehensive MRSA colonization surveillance program. It is not intended to diagnose MRSA infection nor to guide or monitor treatment for MRSA infections. Performed at [facility], [place] [address] [place], [place] [address]      Labs: Basic Metabolic Panel: Recent Labs  Lab 12/05/19 0454 12/06/19 0545 12/07/19 0542 12/08/19 0531 12/09/19 0438  NA 140 137 137 137 139  K 4.9 3.8 4.2 3.5 3.6  CL 99 96* 95* 97* 96*  CO2 _0 GLUCOSE 262* 197* 244* 88 169*  BUN 59* 51* 54* 50* 49*  CREATININE 1.41* 1.33* 1.27* 1.15* 1.29*  CALCIUM 8.7* 8.6* 8.5* 8.6* 8.5*  MG 2.4  --   --   --   --    Liver Function Tests: Recent Labs  Lab 12/03/19 1320  AST 29  ALT 66*  ALKPHOS 439*  BILITOT 0.5  PROT 6.7  ALBUMIN 2.8*   No results for input(s): LIPASE, AMYLASE in the last 168 hours. No results for input(s): AMMONIA in the last 168 hours. CBC: Recent Labs  Lab 12/05/19 0454 12/07/19 0542 12/08/19 0531 12/09/19 0438  WBC 13.7* 13.9* 16.0* 16.4*  NEUTROABS 12.7* 12.3*  --   --   HGB 9.4* 9.9* 10.4* 10.7*  HCT 31.5* 32.1* 33.7* 35.4*  MCV 91.6 87.7 88.0 88.9  PLT 334 331 352 380    Cardiac Enzymes: No results for input(s): CKTOTAL, CKMB, CKMBINDEX, TROPONINI in the last 168 hours. BNP: BNP (last 3 results) Recent Labs    12/01/19 1837  BNP 938.7*    ProBNP (last 3 results) No results for input(s): PROBNP in the last 8760 hours.  CBG: Recent Labs  Lab 12/08/19 1143 12/08/19 1654 12/08/19 2059 12/09/19 0556 12/09/19 1136  GLUCAP 167* 231* 133* 153* 258*       Signed:  Irine Seal MD.  [facility] 12/09/2019, 11:40 AM

## 2019-12-09 NOTE — Plan of Care (Signed)
  Problem: Education: Goal: Knowledge of General Education information will improve Description: Including pain rating scale, medication(s)/side effects and non-pharmacologic comfort measures Outcome: Progressing   Problem: Health Behavior/Discharge Planning: Goal: Ability to manage health-related needs will improve Outcome: Progressing   Problem: Safety: Goal: Ability to remain free from injury will improve Outcome: Progressing   Problem: Skin Integrity: Goal: Risk for impaired skin integrity will decrease Outcome: Progressing   

## 2019-12-09 NOTE — Care Management Important Message (Signed)
Important Message  Patient Details  Name: [name] MRN: [id number] Date of Birth: 21-Dec-1960   Medicare Important Message Given:  Yes     Shelda Altes 12/09/2019, 12:43 PM

## 2019-12-17 ENCOUNTER — Telehealth: Payer: Self-pay | Admitting: *Deleted

## 2019-12-17 ENCOUNTER — Other Ambulatory Visit: Payer: Self-pay

## 2019-12-17 ENCOUNTER — Encounter (HOSPITAL_COMMUNITY): Payer: Self-pay

## 2019-12-17 ENCOUNTER — Ambulatory Visit (HOSPITAL_COMMUNITY)
Admission: RE | Admit: 2019-12-17 | Discharge: 2019-12-17 | Disposition: A | Discharge status: Home / Self Care | Payer: Medicare Other | Source: Ambulatory Visit | Attending: Cardiology | Admitting: Cardiology

## 2019-12-17 VITALS — BP 141/78 | HR 84 | Wt 219.6 lb

## 2019-12-17 DIAGNOSIS — I5032 Chronic diastolic (congestive) heart failure: Secondary | ICD-10-CM | POA: Insufficient documentation

## 2019-12-17 DIAGNOSIS — Z7902 Long term (current) use of antithrombotics/antiplatelets: Secondary | ICD-10-CM | POA: Diagnosis not present

## 2019-12-17 DIAGNOSIS — Z79899 Other long term (current) drug therapy: Secondary | ICD-10-CM | POA: Insufficient documentation

## 2019-12-17 DIAGNOSIS — J449 Chronic obstructive pulmonary disease, unspecified: Secondary | ICD-10-CM | POA: Diagnosis not present

## 2019-12-17 DIAGNOSIS — Z7901 Long term (current) use of anticoagulants: Secondary | ICD-10-CM | POA: Diagnosis not present

## 2019-12-17 DIAGNOSIS — Z7951 Long term (current) use of inhaled steroids: Secondary | ICD-10-CM | POA: Diagnosis not present

## 2019-12-17 DIAGNOSIS — I2510 Atherosclerotic heart disease of native coronary artery without angina pectoris: Secondary | ICD-10-CM | POA: Insufficient documentation

## 2019-12-17 DIAGNOSIS — K7460 Unspecified cirrhosis of liver: Secondary | ICD-10-CM | POA: Insufficient documentation

## 2019-12-17 DIAGNOSIS — E1122 Type 2 diabetes mellitus with diabetic chronic kidney disease: Secondary | ICD-10-CM | POA: Insufficient documentation

## 2019-12-17 DIAGNOSIS — Z87891 Personal history of nicotine dependence: Secondary | ICD-10-CM | POA: Diagnosis not present

## 2019-12-17 DIAGNOSIS — I10 Essential (primary) hypertension: Secondary | ICD-10-CM

## 2019-12-17 DIAGNOSIS — I5033 Acute on chronic diastolic (congestive) heart failure: Secondary | ICD-10-CM | POA: Diagnosis not present

## 2019-12-17 DIAGNOSIS — I130 Hypertensive heart and chronic kidney disease with heart failure and stage 1 through stage 4 chronic kidney disease, or unspecified chronic kidney disease: Secondary | ICD-10-CM | POA: Insufficient documentation

## 2019-12-17 DIAGNOSIS — N183 Chronic kidney disease, stage 3 unspecified: Secondary | ICD-10-CM | POA: Insufficient documentation

## 2019-12-17 DIAGNOSIS — E039 Hypothyroidism, unspecified: Secondary | ICD-10-CM | POA: Insufficient documentation

## 2019-12-17 DIAGNOSIS — Z8249 Family history of ischemic heart disease and other diseases of the circulatory system: Secondary | ICD-10-CM | POA: Diagnosis not present

## 2019-12-17 DIAGNOSIS — I480 Paroxysmal atrial fibrillation: Secondary | ICD-10-CM | POA: Diagnosis not present

## 2019-12-17 DIAGNOSIS — Z794 Long term (current) use of insulin: Secondary | ICD-10-CM | POA: Insufficient documentation

## 2019-12-17 DIAGNOSIS — Z79890 Hormone replacement therapy (postmenopausal): Secondary | ICD-10-CM | POA: Insufficient documentation

## 2019-12-17 LAB — BASIC METABOLIC PANEL
Anion gap: 12 (ref 5–15)
BUN: 31 mg/dL — ABNORMAL HIGH (ref 6–20)
CO2: 29 mmol/L (ref 22–32)
Calcium: 8.9 mg/dL (ref 8.9–10.3)
Chloride: 95 mmol/L — ABNORMAL LOW (ref 98–111)
Creatinine, Ser: 1.58 mg/dL — ABNORMAL HIGH (ref 0.44–1.00)
GFR calc Af Amer: 42 mL/min — ABNORMAL LOW (ref >60)
GFR calc non Af Amer: 36 mL/min — ABNORMAL LOW (ref >60)
Glucose, Bld: 339 mg/dL — ABNORMAL HIGH (ref 70–99)
Potassium: 4.1 mmol/L (ref 3.5–5.1)
Sodium: 136 mmol/L (ref 135–145)

## 2019-12-17 LAB — CBC
HCT: 34.1 % — ABNORMAL LOW (ref 36.0–46.0)
Hemoglobin: 10.3 g/dL — ABNORMAL LOW (ref 12.0–15.0)
MCH: 28.4 pg (ref 26.0–34.0)
MCHC: 30.2 g/dL (ref 30.0–36.0)
MCV: 93.9 fL (ref 80.0–100.0)
Platelets: 305 10*3/uL (ref 150–400)
RBC: 3.63 MIL/uL — ABNORMAL LOW (ref 3.87–5.11)
RDW: 16.6 % — ABNORMAL HIGH (ref 11.5–15.5)
WBC: 12.5 10*3/uL — ABNORMAL HIGH (ref 4.0–10.5)
nRBC: 0 % (ref 0.0–0.2)

## 2019-12-17 MED ORDER — ATORVASTATIN CALCIUM 40 MG PO TABS: ORAL_TABLET | ORAL | 0 refills | Status: DC

## 2019-12-17 MED ORDER — AMLODIPINE BESYLATE 10 MG PO TABS: 10.0000 mg | ORAL_TABLET | Freq: Every day | ORAL | 1 refills | Status: DC

## 2019-12-17 MED ORDER — TORSEMIDE 20 MG PO TABS: 40.0000 mg | ORAL_TABLET | Freq: Two times a day (BID) | ORAL | 1 refills | Status: DC

## 2019-12-17 MED ORDER — CLOPIDOGREL BISULFATE 75 MG PO TABS: 75.0000 mg | ORAL_TABLET | Freq: Every day | ORAL | Status: DC

## 2019-12-17 MED ORDER — SPIRONOLACTONE 25 MG PO TABS: 25.0000 mg | ORAL_TABLET | Freq: Every day | ORAL | 3 refills | Status: DC

## 2019-12-17 MED ORDER — BISOPROLOL FUMARATE 5 MG PO TABS: 2.5000 mg | ORAL_TABLET | Freq: Every day | ORAL | 1 refills | Status: DC

## 2019-12-17 MED ORDER — APIXABAN 5 MG PO TABS: 5.0000 mg | ORAL_TABLET | Freq: Two times a day (BID) | ORAL | 0 refills | Status: DC

## 2019-12-17 MED ORDER — AMIODARONE HCL 100 MG PO TABS: 100.0000 mg | ORAL_TABLET | Freq: Every day | ORAL | 1 refills | Status: DC

## 2019-12-17 NOTE — Patient Instructions (Signed)
Lab work done today. We will notify you of any abnormal lab work. No news is good news!  INCREASE Spironolactone to 25mg  tab daily.   Please follow up with the [facility] in 4-6 weeks.  At the [facility], you and your health needs are our priority. As part of our continuing mission to provide you with exceptional heart care, we have created designated Provider Care Teams. These Care Teams include your primary Cardiologist (physician) and Advanced Practice Providers (APPs- Physician Assistants and Nurse Practitioners) who all work together to provide you with the care you need, when you need it.   You may see any of the following providers on your designated Care Team at your next follow up: [name] Dr Glori Bickers . Dr Loralie Champagne . Darrick Grinder, NP . Lyda Jester, PA . Audry Riles, PharmD   Please be sure to bring in all your medications bottles to every appointment.

## 2019-12-17 NOTE — Progress Notes (Addendum)
Patient ID: [name], female   DOB: 25-Jun-1961, 59 y.o.   MRN: [id number]    Advanced Heart Failure Clinic Note   PCP: Dr Roxy Manns HF Cardiology: Dr. Aundra Dubin PV: Dr. Gwenlyn Found  [name] is a 59 year old with history of PAD, carotid stenosis s/p left CEA, relatively mild CAD, chronic systolic CHF, paroxysmal atrial fibrillation and prior substance abuse presents for CHF clinic followup.  She was admitted in 1/17 with acute hypoxemic respiratory failure in the setting of atrial fibrillation/RVR and volume overload.  She was initially intubated.  She converted back to NSR with amiodarone gtt.  She was treated with IV Lasix, steroids, bronchodilators.  She developed AKI and losartan was stopped.    She was admitted in 3/17 with symptomatic bradycardia.  She was supposed to stop diltiazem after this admission but continued to take it.  She presented later in 3/17, again with symptomatic bradycardia (junctional bradycardia), hypotension, and AKI.  Diltiazem and bisoprolol were stopped.  HR recovered and she has had no problems since.  ACEI was stopped with AKI.   In 5/18, she had peripheral angiography with atherectomy of right SFA.  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 7/18 ABIs showed 0.65 on right, 0.31 on left.    Admitted 10/5 through 08/19/2018 with alcohol intoxication and passed out in the parking lot. EMS was called. She did not have any fractures. Torsemide, spironolactone, metolazone was stopped due to AKI.  She was discharged the next day.    Had recent hospitalization 1/21 for a/c CHF w/ volume overload and required IV diuresis. Echo showed EF 50% anterolateral hypokinesis, normal-appearing RV, PASP 60 mmHg. She responded well to IV diuretics and diuresed back down to her baseline of 103 kg (226 lb). Diuretics changed back to home regimen, toresmide 40 mg bid and spironolactone 12.5 mg also added.   She reports back to clinic for f/u.  Doing well. No complaints. No resting dyspnea but continues w/ exertional dyspnea with mild-moderate activity. No wt gain. Wt today 219 lb (226 at discharge). Reports full med compliance. BP mildly elevated at 141/78. HR 84.  Finally quit smoking.     Labs (1/17): K 5.2, creatinine 1.4, AST 43, ALT normal Labs (2/17): K 4.2, creatinine 1.3, BNP 607, TSH normal Labs (3/17): K 4.2, creatinine 1.45, AST/ALT normal, HCT 28.7 Labs (4/17): K 4.4, creatinine 1.61 Labs (08/31/2016): K 4.5 Creatinine 1.43  Labs (11/17): K 4.2, creatinine 1.4 Labs (9/18): K 4.1, creatinine 1.1, TSH normal Labs 04/13/2018: K 5 Creatinine 1.75 Labs 08/19/2018: K 4.3 Creatinine 2.07  Labs 12/09/2019: K 3.6, Creatinine 1.29  PMH: 1. Carotid stenosis: Known occluded right carotid.  Left CEA in 4/16.  2. CAD: LHC in 12/15 with 80% stenosis in small OM1, nonobstructive disease in other territories.  3. Chronic systolic CHF: Nonischemic cardiomyopathy (?due to ETOH or prior drug abuse).  - Echo (1/17) with EF 45%, mild LV hypertrophy, moderate diastolic dysfunction, inferolateral severe hypokinesis, mildly decreased RV systolic function.  - Echo (1/18): EF 40-45%, mild LVH, normal RV size and systolic function.  4. Atrial fibrillation: Paroxysmal.   5. Type II diabetes 6. CKD: Stage III. 7. COPD: Quit smoking 1/17. On home oxygen.  8. 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  No longer drinks.  9. Hypothyroidism 10. PAD: Atherectomy SFA in 2014 (Dr Gwenlyn Found).  Peripheral arterial dopplers (2/17) with focal 75-99% proximal right SFA stenosis, occluded mid-distal right SFA, chronic occlusion  of all runoff arteries on right. - In 5/18, she had peripheral angiography with atherectomy of right SFA.   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  - 7/18 ABIs showed 0.65 on right, 0.31 on left.   11. Anemia 12. Prior cocaine abuse.  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   SH: Lives with sister.  Prior cocaine abuse.  Prior ETOH abuse.  Quit smoking in 1/17, restarted, and quit again in 4/19.  FH: CAD  Review of systems complete and found to be negative unless listed in HPI.    Current Outpatient Medications  Medication Sig Dispense Refill  . acetaminophen (TYLENOL) 500 MG tablet Take 2 tablets (1,000 mg total) by mouth every 6 (six) hours as needed for mild pain. 30 tablet 0  . albuterol (VENTOLIN HFA) 108 (90 Base) MCG/ACT inhaler Inhale 2 puffs into the lungs every 6 (six) hours as needed for wheezing or shortness of breath. 18 g 0  . amiodarone (PACERONE) 100 MG tablet Take 1 tablet (100 mg total) by mouth daily. 30 tablet 1  . amLODipine (NORVASC) 10 MG tablet Take 1 tablet (10 mg total) by mouth daily. 30 tablet 1  . apixaban (ELIQUIS) 5 MG TABS tablet Take 1 tablet (5 mg total) by mouth 2 (two) times daily. 60 tablet 0  . atorvastatin (LIPITOR) 40 MG tablet TAKE 1 TABLET BY MOUTH ONCE EVERY EVENING 30 tablet 0  . bisoprolol (ZEBETA) 5 MG tablet Take 0.5 tablets (2.5 mg total) by mouth daily. 30 tablet 1  . budesonide-formoterol (SYMBICORT) 160-4.5 MCG/ACT inhaler Inhale 2 puffs into the lungs 2 (two) times daily. 1 Inhaler 2  . cholecalciferol (VITAMIN D) 1000 units tablet Take 1 tablet (1,000 Units total) by mouth daily.    . clopidogrel (PLAVIX) 75 MG tablet Take 1 tablet (75 mg total) by mouth daily.    . colchicine 0.6 MG tablet Take 0.6-1.2 mg by mouth See admin instructions. Take 1.2mg  by mouth initially at onset of gout flare up, then take 0.6mg  in one hour, then stop.    [name] docusate sodium (COLACE) 100 MG capsule Take 1 capsule (100 mg total) by mouth daily. 10 capsule 0  . folic acid (FOLVITE) 1 MG tablet Take 1 tablet (1 mg total) by mouth daily. 30 tablet 0  . Insulin Glargine (LANTUS SOLOSTAR) 100 UNIT/ML Solostar Pen Inject 40 Units into the skin at bedtime.    . Insulin Lispro (HUMALOG KWIKPEN) 200 UNIT/ML SOPN  Inject 8 Units into the skin 3 (three) times daily with meals.    [name] levothyroxine (SYNTHROID, LEVOTHROID) 50 MCG tablet Take 1 tablet (50 mcg total) by mouth daily. 30 tablet 0  . Multiple Vitamin (MULTIVITAMIN WITH MINERALS) TABS tablet Take 1 tablet by mouth daily. 30 tablet 0  . nicotine (NICODERM CQ - DOSED IN MG/24 HOURS) 21 mg/24hr patch Place 1 patch (21 mg total) onto the skin daily. 28 patch 0  . nortriptyline (PAMELOR) 50 MG capsule Take 50 mg by mouth at bedtime.    . pantoprazole (PROTONIX) 40 MG tablet Take 1 tablet (40 mg total) by mouth daily.    . predniSONE (DELTASONE) 20 MG tablet Take 1-2 tablets (20-40 mg total) by mouth daily before breakfast. Take 2 tablets (40mg ) daily x3 days, then 1 tablet (20mg ) daily x3 days then stop. 9 tablet 0  . PROLENSA 0.07 % SOLN Place 1 drop into the right eye  daily.    . senna-docusate (SENOKOT-S) 8.6-50 MG tablet Take 2 tablets by mouth at bedtime as needed for mild constipation.    [name] spironolactone (ALDACTONE) 25 MG tablet Take 0.5 tablets (12.5 mg total) by mouth daily. 30 tablet 1  . thiamine 100 MG tablet Take 1 tablet (100 mg total) by mouth daily. 30 tablet 0  . tiotropium (SPIRIVA HANDIHALER) 18 MCG inhalation capsule Place 1 capsule (18 mcg total) into inhaler and inhale every morning. 30 capsule 2  . torsemide (DEMADEX) 20 MG tablet Take 2 tablets (40 mg total) by mouth 2 (two) times daily. 120 tablet 1   No current facility-administered medications for this encounter.   Vitals:   12/17/19 1441  Weight: 99.6 kg     Wt Readings from Last 3 Encounters:  12/17/19 99.6 kg  12/09/19 101.5 kg  09/24/19 103.9 kg    Physical Exam  General: obese WF, No resp difficulty. Arrived wheel chair.  HEENT: normal Neck: supple. No JVP. Carotids 2+ bilat; no bruits. No lymphadenopathy or thryomegaly appreciated. Cor: PMI nondisplaced. Regular rate & rhythm. No rubs, gallops or murmurs. Lungs: clear Abdomen: obese, soft, nontender,  nondistended. No hepatosplenomegaly. No bruits or masses. Good bowel sounds. Extremities: no cyanosis, clubbing, rash, trace bilateral LEE Neuro: alert & orientedx3, cranial nerves grossly intact. moves all 4 extremities w/o difficulty. Affect pleasant    1. Chronic diastolic CHF: Has been presumed nonischemic given LHC in 12/15 showing only 80% stenosis in small OM1.Echo 10/2018 EF 45-50% Grade II DD.  Echo1/21 with EF 50% but PASP 60 mmHg. Recent admit for a/c CHF w/ volume overload, treated w/ IV diuretics and diuresed back down to baseline wt of 226 lb.  - Wt/volume stable since d/c. Wt 220 lb today.  - NYHA Class III - Continue torsemide 40 mg bid.  - Increase spironolactone to 25 mg daily.  - Will proceed with process for Cardiomems implant. Start approval process.  2. Atrial fibrillation: Paroxysmal. She is in NSR.  - Continue amiodarone 100 mg daily. - Continue Eliquis 5 mg twice a day. No bleeding. Check CBC 3. [id number] on left.  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 However, angiography in 8/20 showed left SFA occlusion. Dr. Gwenlyn Found wanted her to be seen by [facility].  - She has been on Plavix in addition to Eliquis. Check CBC today  4. AECOPD: Much improved with steroids, abx, nebs and diuresis.   - no wheezing on exam. Denies cough.  - She has quit smoking.  5. H/o cirrhosis:   - will need to monitor HFTs w/ amiodarone (1/21 AST 54, ALT 83). Repeat HFTs in 6 weeks  6. OSA: - Unable to tolerate CPAP.  7. CKD stage III: Check BMP today  8. HTN: mildly elevated.  - Check BMP today and plan to increase spironolactone to 25 mg daily  9. Bradycardia:  - Junctional rhythm when hospitalized in 3/17. - HR stable on low dose bisoprolol  10. DM2  - with CAD/PAD and CKD.  - Consider future addition of SGLT2i   F/u w/ Dr. Aundra Dubin in 4-6 weeks.     Lyda Jester, PA-C  12/17/2019

## 2019-12-17 NOTE — Telephone Encounter (Signed)
A message was left, re: her follow up visit. 

## 2019-12-18 ENCOUNTER — Other Ambulatory Visit (HOSPITAL_COMMUNITY): Payer: Self-pay

## 2019-12-18 DIAGNOSIS — I2510 Atherosclerotic heart disease of native coronary artery without angina pectoris: Secondary | ICD-10-CM

## 2019-12-18 DIAGNOSIS — I480 Paroxysmal atrial fibrillation: Secondary | ICD-10-CM

## 2019-12-18 DIAGNOSIS — J449 Chronic obstructive pulmonary disease, unspecified: Secondary | ICD-10-CM

## 2019-12-18 DIAGNOSIS — I10 Essential (primary) hypertension: Secondary | ICD-10-CM

## 2019-12-18 MED ORDER — ATORVASTATIN CALCIUM 40 MG PO TABS: ORAL_TABLET | ORAL | 3 refills | Status: DC

## 2019-12-18 MED ORDER — AMLODIPINE BESYLATE 10 MG PO TABS: 10.0000 mg | ORAL_TABLET | Freq: Every day | ORAL | 3 refills | Status: DC

## 2019-12-18 MED ORDER — BISOPROLOL FUMARATE 5 MG PO TABS: 2.5000 mg | ORAL_TABLET | Freq: Every day | ORAL | 3 refills | Status: DC

## 2019-12-18 MED ORDER — SPIRONOLACTONE 25 MG PO TABS: 25.0000 mg | ORAL_TABLET | Freq: Every day | ORAL | 3 refills | Status: DC

## 2019-12-18 MED ORDER — APIXABAN 5 MG PO TABS: 5.0000 mg | ORAL_TABLET | Freq: Two times a day (BID) | ORAL | 3 refills | Status: DC

## 2019-12-18 MED ORDER — TORSEMIDE 20 MG PO TABS: 40.0000 mg | ORAL_TABLET | Freq: Two times a day (BID) | ORAL | 3 refills | Status: DC

## 2019-12-18 MED ORDER — CLOPIDOGREL BISULFATE 75 MG PO TABS: 75.0000 mg | ORAL_TABLET | Freq: Every day | ORAL | 3 refills | Status: DC

## 2019-12-18 MED ORDER — AMIODARONE HCL 100 MG PO TABS: 100.0000 mg | ORAL_TABLET | Freq: Every day | ORAL | 3 refills | Status: DC

## 2019-12-20 ENCOUNTER — Telehealth (HOSPITAL_COMMUNITY): Payer: Self-pay

## 2019-12-20 NOTE — Telephone Encounter (Signed)
-----   Message from [place], [place] sent at 12/17/2019  5:23 PM EST ----- Labs ok but will need repeat BMP in 7 days to see if SCr and K ok on higher dose of spironolactone.

## 2019-12-20 NOTE — Telephone Encounter (Signed)
LMOM for call back. 

## 2019-12-25 ENCOUNTER — Ambulatory Visit: Payer: Medicare Other | Admitting: Family

## 2019-12-25 ENCOUNTER — Ambulatory Visit (HOSPITAL_COMMUNITY)
Admission: RE | Admit: 2019-12-25 | Discharge: 2019-12-25 | Disposition: A | Discharge status: Home / Self Care | Payer: Medicare Other | Source: Ambulatory Visit | Attending: Vascular Surgery | Admitting: Vascular Surgery

## 2019-12-25 ENCOUNTER — Other Ambulatory Visit: Payer: Self-pay

## 2019-12-25 ENCOUNTER — Ambulatory Visit (INDEPENDENT_AMBULATORY_CARE_PROVIDER_SITE_OTHER)
Admission: RE | Admit: 2019-12-25 | Discharge: 2019-12-25 | Disposition: A | Discharge status: Home / Self Care | Payer: Medicare Other | Source: Ambulatory Visit | Attending: Vascular Surgery | Admitting: Vascular Surgery

## 2019-12-25 DIAGNOSIS — I739 Peripheral vascular disease, unspecified: Secondary | ICD-10-CM | POA: Diagnosis not present

## 2019-12-26 ENCOUNTER — Ambulatory Visit (INDEPENDENT_AMBULATORY_CARE_PROVIDER_SITE_OTHER): Payer: Medicare Other | Admitting: Physician Assistant

## 2019-12-26 VITALS — BP 150/60

## 2019-12-26 DIAGNOSIS — I739 Peripheral vascular disease, unspecified: Secondary | ICD-10-CM | POA: Insufficient documentation

## 2019-12-26 NOTE — Progress Notes (Signed)
Virtual Visit via Telephone Note  I connected with [name] on 12/26/2019 using the [employer] by telephone and verified that I was speaking with the correct person using two identifiers. Patient was located at home. I am located at [facility] office.   The limitations of evaluation and management by telemedicine and the availability of in person appointments have been previously discussed with the patient and are documented in the patients chart. The patient expressed understanding and consented to proceed.  PCP: Tsosie Billing, MD (Inactive)  History of Present Illness: [name] is a 59 y.o. female who had graft surveillance and ABIs performed yesterday.  In August 2020 she underwent left femoral to below the knee popliteal bypass with vein and fifth toe ray amputation by Dr. Donnetta Hutching.  Patient states all incisions including toe amputation site are well-healed.  Her foot feels much better compared to before surgery.  She denies any further tissue loss or rest pain in left lower extremity.  Patient also states that she was admitted to the hospital last month for heart failure exacerbation and developed blisters on her right leg due to lower extremity edema.  She believes these blisters are improving and she is not concerned with her appearance.  She does not feel that she needs to be evaluated for these wounds at this time.  She is taking Eliquis and aspirin daily.  Past Medical History:  Diagnosis Date  . Alcohol abuse   . Alcoholic cirrhosis ([place])   . Anemia   . Anxiety   . Arthritis    "knees" (11/26/2018)  . B12 deficiency   . CAD (coronary artery disease)    a. 11/10/2014 Cath: LM nl, LAD min irregs, D1 30 ost, D2 50d, LCX 59m, OM1 80 p/m (1.5 mm vessel), OM2 66m, RCA nondom 17m-->med rx.. Demand ischemia in the setting of rapid a-fib.  . Cardiomyopathy ([place])   . Carotid artery disease ([place])    a. 01/2015 Carotid Angio: RICA [id number], LICA [id number]; b. [id number] s/p L CEA; c. 05/2019  Carotid U/S: RICA 100. RECA >50. LICA [id number].  . Cellulitis    lower extremities  . Chronic combined systolic and diastolic CHF (congestive heart failure) ([place])    a. 10/2014 Echo: EF 40-45%; b. 10/2018 Echo: EF 45-50%, gr2 DD; c. 11/2019 Echo: EF 50%, mild LVH, gr2 DD (restrictive), antlat HK, Nl RV fxn. Mild BAE. RVSP 59.43mmHg.  . CKD (chronic kidney disease), stage III   . Cocaine abuse ([place])   . COPD (chronic obstructive pulmonary disease) ([place])   . Critical lower limb ischemia   . Depression   . Diabetic peripheral neuropathy ([place])   . Dyspnea   . Elevated troponin    a. Chronic elevation.  [name] GERD (gastroesophageal reflux disease)   . Hyperlipemia   . Hypertension   . Hypokalemia   . Hypomagnesemia   . Hypothyroidism   . Marijuana abuse   . Narcotic abuse ([place])   . Noncompliance   . NSVT (nonsustained ventricular tachycardia) ([place])   . Obesity   . PAF (paroxysmal atrial fibrillation) ([place])   . Paroxysmal atrial tachycardia ([place])   . Peripheral arterial disease ([place])    a. 01/2015 Angio/PTA: RSFA 99 (atherectomy/pta) - 1 vessel runoff via diff dzs peroneal; b. 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  . Pneumonia    "once or twice" (11/26/2018)  . Poorly controlled type 2 diabetes mellitus ([place])   .  Renal insufficiency    a. 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  [name] Sleep apnea    "couldn't handle wearing the mask" (11/26/2018)  . Symptomatic bradycardia    a. Avoid AV blocking agent per EP. Prev req temp wire in 2017.  . Tobacco abuse     Past Surgical History:  Procedure Laterality Date  . ABDOMINAL AORTOGRAM N/A 06/26/2019   Procedure: ABDOMINAL AORTOGRAM;  Surgeon: Wellington Hampshire, MD;  Location: [place] [facility] [facility];  Service: Cardiovascular;  Laterality: N/A;  . AMPUTATION Right 06/14/2017   Procedure: Right foot transmetatarsal amputation;  Surgeon: Newt Minion, MD;  Location: [facility];  Service: Orthopedics;  Laterality: Right;  . AMPUTATION Left 06/28/2019    Procedure: AMPUTATION LEFT FIFTH TOE;  Surgeon: Rosetta Posner, MD;  Location: [facility];  Service: Vascular;  Laterality: Left;  . AMPUTATION TOE Right 04/28/2017   Procedure: AMPUTATION OF RIGHT SECOND RAY;  Surgeon: Newt Minion, MD;  Location: [place] [facility];  Service: Orthopedics;  Laterality: Right;  . CARDIAC CATHETERIZATION    . CARDIAC CATHETERIZATION N/A 01/12/2016   Procedure: Temporary Wire;  Surgeon: Minus Breeding, MD;  Location: [place] [facility];  Service: Cardiovascular;  Laterality: N/A;  . CARDIOVERSION  ~ 02/2013   "twice"   . CAROTID ANGIOGRAM N/A 01/15/2015   Procedure: CAROTID ANGIOGRAM;  Surgeon: [name], MD;  Location: [facility] CATH LAB;  Service: Cardiovascular;  Laterality: N/A;  . DILATION AND CURETTAGE OF UTERUS  1988  . ENDARTERECTOMY Left 02/19/2015   Procedure: LEFT CAROTID ENDARTERECTOMY ;  Surgeon: Serafina Mitchell, MD;  Location: [place];  Service: Vascular;  Laterality: Left;  . FEMORAL-TIBIAL BYPASS GRAFT Left 06/28/2019   Procedure: BYPASS GRAFT LEFT LEG FEMORAL TO ANTERIOR TIBIAL ARTERY using LEFT GREATER SAPHENOUS VEIN;  Surgeon: Rosetta Posner, MD;  Location: [place];  Service: Vascular;  Laterality: Left;  . LEFT HEART CATHETERIZATION WITH CORONARY ANGIOGRAM N/A 10/31/2014   Procedure: LEFT HEART CATHETERIZATION WITH CORONARY ANGIOGRAM;  Surgeon: Burnell Blanks, MD;  Location[facility] C[facility] LAB;  Service: Cardiovascular;  Laterality: N/A;  . LOWER EXTREMITY ANGIOGRAM N/A 09/10/2013   Procedure: LOWER EXTREMITY ANGIOGRAM;  Surgeon: [name], MD;  Location: [facility] CATH LAB;  Service: Cardiovascular;  Laterality: N/A;  . LOWER EXTREMITY ANGIOGRAM N/A 01/15/2015   Procedure: LOWER EXTREMITY ANGIOGRAM;  Surgeon: [name], MD;  Location: [facility] CATH LAB;  Service: Cardiovascular;  Laterality: N/A;  . LOWER EXTREMITY ANGIOGRAPHY N/A 04/13/2017   Procedure: Lower Extremity Angiography;  Surgeon: [name], MD;  Location: [place] [facility];  Service: Cardiovascular;   Laterality: N/A;  . LOWER EXTREMITY ANGIOGRAPHY Left 06/26/2019   Procedure: LOWER EXTREMITY ANGIOGRAPHY;  Surgeon: Wellington Hampshire, MD;  Location: [place] [facility];  Service: Cardiovascular;  Laterality: Left;  . PERIPHERAL VASCULAR BALLOON ANGIOPLASTY Left 06/26/2019   Procedure: PERIPHERAL VASCULAR BALLOON ANGIOPLASTY;  Surgeon: Wellington Hampshire, MD;  Location: [place] [facility];  Service: Cardiovascular;  Laterality: Left;  unable to cross lt sfa occlusion  . PERIPHERAL VASCULAR INTERVENTION  04/13/2017   Procedure: Peripheral Vascular Intervention;  Surgeon: [name], MD;  Location: [place] [facility];  Service: Cardiovascular;;  . VEIN HARVEST Left 06/28/2019   Procedure: LEFT LEG GREATER SAPHENOUS VEIN HARVEST;  Surgeon: Rosetta Posner, MD;  Location: [facility];  Service: Vascular;  Laterality: Left;    Current Meds  Medication Sig  . acetaminophen (TYLENOL) 500 MG tablet Take 2 tablets (1,000 mg  total) by mouth every 6 (six) hours as needed for mild pain.  [name] albuterol (VENTOLIN HFA) 108 (90 Base) MCG/ACT inhaler Inhale 2 puffs into the lungs every 6 (six) hours as needed for wheezing or shortness of breath.  [name] amiodarone (PACERONE) 100 MG tablet Take 1 tablet (100 mg total) by mouth daily.  [name] amLODipine (NORVASC) 10 MG tablet Take 1 tablet (10 mg total) by mouth daily.  [name] apixaban (ELIQUIS) 5 MG TABS tablet Take 1 tablet (5 mg total) by mouth 2 (two) times daily.  [name] atorvastatin (LIPITOR) 40 MG tablet TAKE 1 TABLET BY MOUTH ONCE EVERY EVENING  . bisoprolol (ZEBETA) 5 MG tablet Take 0.5 tablets (2.5 mg total) by mouth daily.  . budesonide-formoterol (SYMBICORT) 160-4.5 MCG/ACT inhaler Inhale 2 puffs into the lungs 2 (two) times daily.  . cholecalciferol (VITAMIN D) 1000 units tablet Take 1 tablet (1,000 Units total) by mouth daily.  . clopidogrel (PLAVIX) 75 MG tablet Take 1 tablet (75 mg total) by mouth daily.  [name] docusate sodium (COLACE) 100 MG capsule Take 1 capsule (100 mg  total) by mouth daily.  . folic acid (FOLVITE) 1 MG tablet Take 1 tablet (1 mg total) by mouth daily.  . Insulin Glargine (LANTUS SOLOSTAR) 100 UNIT/ML Solostar Pen Inject 40 Units into the skin at bedtime.  . Insulin Lispro (HUMALOG KWIKPEN) 200 UNIT/ML SOPN Inject 8 Units into the skin 3 (three) times daily with meals.  [name] levothyroxine (SYNTHROID, LEVOTHROID) 50 MCG tablet Take 1 tablet (50 mcg total) by mouth daily.  . Multiple Vitamin (MULTIVITAMIN WITH MINERALS) TABS tablet Take 1 tablet by mouth daily.  . nortriptyline (PAMELOR) 50 MG capsule Take 50 mg by mouth at bedtime.  . pantoprazole (PROTONIX) 40 MG tablet Take 1 tablet (40 mg total) by mouth daily.  . predniSONE (DELTASONE) 20 MG tablet Take 1-2 tablets (20-40 mg total) by mouth daily before breakfast. Take 2 tablets (40mg ) daily x3 days, then 1 tablet (20mg ) daily x3 days then stop.  [name] PROLENSA 0.07 % SOLN Place 1 drop into the right eye daily.  [name] senna-docusate (SENOKOT-S) 8.6-50 MG tablet Take 2 tablets by mouth at bedtime as needed for mild constipation.  [name] spironolactone (ALDACTONE) 25 MG tablet Take 1 tablet (25 mg total) by mouth daily.  [name] thiamine 100 MG tablet Take 1 tablet (100 mg total) by mouth daily.  [name] tiotropium (SPIRIVA HANDIHALER) 18 MCG inhalation capsule Place 1 capsule (18 mcg total) into inhaler and inhale every morning.  . torsemide (DEMADEX) 20 MG tablet Take 2 tablets (40 mg total) by mouth 2 (two) times daily.    12 system ROS was negative unless otherwise noted in HPI   Observations/Objective: ABI/TBIToday's ABIToday's TBIPrevious ABIPrevious TBI  +-------+-----------+-----------+------------+------------+  Right 0.44    amp    0.25            +-------+-----------+-----------+------------+------------+  Left  0.99    0.65    0.63             Patent left femoral to anterior tibial artery bypass with no stenosis noted; left groin fluid collection  measuring 2 x 2   Assessment and Plan:  -Patent left lower extremity bypass with well-healed fifth toe amputation site -Left ABI improved and graft surveillance negative for any areas of hemodynamically significant stenosis -She has known occlusive disease in right lower extremity and now with blistering secondary to edema and CHF exacerbation; she does not feel these wounds need to be evaluated and will  call if these areas worsen -Continue current anticoagulation -Encouraged active lifestyle -Recheck graft surveillance and ABIs in 6 months; if at that time studies are unchanged we can follow annually   Follow Up Instructions:   Follow up in 6 month(s)   I discussed the assessment and treatment plan with the patient. The patient was provided an opportunity to ask questions and all were answered. The patient agreed with the plan and demonstrated an understanding of the instructions.   The patient was advised to call back or seek an in-person evaluation if the symptoms worsen or if the condition fails to improve as anticipated.  I spent 15 minutes with the patient via telephone encounter.   Signed, Dagoberto Ligas [facility] Office: [phone]  12/26/2019, 2:08 PM

## 2019-12-27 ENCOUNTER — Other Ambulatory Visit: Payer: Self-pay | Admitting: *Deleted

## 2019-12-27 DIAGNOSIS — I739 Peripheral vascular disease, unspecified: Secondary | ICD-10-CM

## 2019-12-30 DIAGNOSIS — J449 Chronic obstructive pulmonary disease, unspecified: Secondary | ICD-10-CM | POA: Diagnosis not present

## 2020-01-02 NOTE — Addendum Note (Signed)
Encounter addended by: Consuelo Pandy, PA-C on: 01/02/2020 12:06 PM  Actions taken: Clinical Note Signed

## 2020-01-06 DIAGNOSIS — H35352 Cystoid macular degeneration, left eye: Secondary | ICD-10-CM | POA: Diagnosis not present

## 2020-01-06 DIAGNOSIS — H353130 Nonexudative age-related macular degeneration, bilateral, stage unspecified: Secondary | ICD-10-CM | POA: Diagnosis not present

## 2020-01-13 ENCOUNTER — Ambulatory Visit: Payer: Medicare Other | Admitting: Podiatry

## 2020-01-17 DIAGNOSIS — H35363 Drusen (degenerative) of macula, bilateral: Secondary | ICD-10-CM | POA: Diagnosis not present

## 2020-01-17 DIAGNOSIS — H34832 Tributary (branch) retinal vein occlusion, left eye, with macular edema: Secondary | ICD-10-CM | POA: Diagnosis not present

## 2020-01-17 DIAGNOSIS — H35722 Serous detachment of retinal pigment epithelium, left eye: Secondary | ICD-10-CM | POA: Diagnosis not present

## 2020-01-17 DIAGNOSIS — H353131 Nonexudative age-related macular degeneration, bilateral, early dry stage: Secondary | ICD-10-CM | POA: Diagnosis not present

## 2020-01-17 DIAGNOSIS — H35033 Hypertensive retinopathy, bilateral: Secondary | ICD-10-CM | POA: Diagnosis not present

## 2020-01-21 DIAGNOSIS — Z0001 Encounter for general adult medical examination with abnormal findings: Secondary | ICD-10-CM | POA: Diagnosis not present

## 2020-01-21 DIAGNOSIS — Z79899 Other long term (current) drug therapy: Secondary | ICD-10-CM | POA: Diagnosis not present

## 2020-01-21 DIAGNOSIS — M109 Gout, unspecified: Secondary | ICD-10-CM | POA: Diagnosis not present

## 2020-01-21 DIAGNOSIS — I130 Hypertensive heart and chronic kidney disease with heart failure and stage 1 through stage 4 chronic kidney disease, or unspecified chronic kidney disease: Secondary | ICD-10-CM | POA: Diagnosis not present

## 2020-01-23 DIAGNOSIS — T148XXA Other injury of unspecified body region, initial encounter: Secondary | ICD-10-CM | POA: Diagnosis not present

## 2020-01-23 DIAGNOSIS — E785 Hyperlipidemia, unspecified: Secondary | ICD-10-CM | POA: Diagnosis not present

## 2020-01-23 DIAGNOSIS — E039 Hypothyroidism, unspecified: Secondary | ICD-10-CM | POA: Diagnosis not present

## 2020-01-23 DIAGNOSIS — Z008 Encounter for other general examination: Secondary | ICD-10-CM | POA: Diagnosis not present

## 2020-01-27 DIAGNOSIS — J449 Chronic obstructive pulmonary disease, unspecified: Secondary | ICD-10-CM | POA: Diagnosis not present

## 2020-01-28 ENCOUNTER — Other Ambulatory Visit: Payer: Self-pay

## 2020-01-28 ENCOUNTER — Telehealth (HOSPITAL_COMMUNITY): Payer: Self-pay | Admitting: Licensed Clinical Social Worker

## 2020-01-28 ENCOUNTER — Ambulatory Visit (HOSPITAL_COMMUNITY)
Admission: RE | Admit: 2020-01-28 | Discharge: 2020-01-28 | Disposition: A | Discharge status: Home / Self Care | Payer: Medicare Other | Source: Ambulatory Visit | Attending: Cardiology | Admitting: Cardiology

## 2020-01-28 VITALS — BP 150/60 | HR 83 | Wt 222.6 lb

## 2020-01-28 DIAGNOSIS — Z87891 Personal history of nicotine dependence: Secondary | ICD-10-CM | POA: Diagnosis not present

## 2020-01-28 DIAGNOSIS — I5042 Chronic combined systolic (congestive) and diastolic (congestive) heart failure: Secondary | ICD-10-CM | POA: Diagnosis not present

## 2020-01-28 DIAGNOSIS — Z8249 Family history of ischemic heart disease and other diseases of the circulatory system: Secondary | ICD-10-CM | POA: Insufficient documentation

## 2020-01-28 DIAGNOSIS — Z7901 Long term (current) use of anticoagulants: Secondary | ICD-10-CM | POA: Insufficient documentation

## 2020-01-28 DIAGNOSIS — L97514 Non-pressure chronic ulcer of other part of right foot with necrosis of bone: Secondary | ICD-10-CM | POA: Diagnosis not present

## 2020-01-28 DIAGNOSIS — Z79899 Other long term (current) drug therapy: Secondary | ICD-10-CM | POA: Diagnosis not present

## 2020-01-28 DIAGNOSIS — Z794 Long term (current) use of insulin: Secondary | ICD-10-CM | POA: Insufficient documentation

## 2020-01-28 DIAGNOSIS — I480 Paroxysmal atrial fibrillation: Secondary | ICD-10-CM | POA: Diagnosis not present

## 2020-01-28 DIAGNOSIS — K7460 Unspecified cirrhosis of liver: Secondary | ICD-10-CM | POA: Diagnosis not present

## 2020-01-28 DIAGNOSIS — Z7902 Long term (current) use of antithrombotics/antiplatelets: Secondary | ICD-10-CM | POA: Insufficient documentation

## 2020-01-28 DIAGNOSIS — I2510 Atherosclerotic heart disease of native coronary artery without angina pectoris: Secondary | ICD-10-CM | POA: Diagnosis not present

## 2020-01-28 DIAGNOSIS — Z7951 Long term (current) use of inhaled steroids: Secondary | ICD-10-CM | POA: Diagnosis not present

## 2020-01-28 DIAGNOSIS — G4733 Obstructive sleep apnea (adult) (pediatric): Secondary | ICD-10-CM | POA: Insufficient documentation

## 2020-01-28 DIAGNOSIS — J449 Chronic obstructive pulmonary disease, unspecified: Secondary | ICD-10-CM | POA: Diagnosis not present

## 2020-01-28 DIAGNOSIS — E1151 Type 2 diabetes mellitus with diabetic peripheral angiopathy without gangrene: Secondary | ICD-10-CM | POA: Insufficient documentation

## 2020-01-28 DIAGNOSIS — I130 Hypertensive heart and chronic kidney disease with heart failure and stage 1 through stage 4 chronic kidney disease, or unspecified chronic kidney disease: Secondary | ICD-10-CM | POA: Diagnosis not present

## 2020-01-28 DIAGNOSIS — E1122 Type 2 diabetes mellitus with diabetic chronic kidney disease: Secondary | ICD-10-CM | POA: Insufficient documentation

## 2020-01-28 DIAGNOSIS — N183 Chronic kidney disease, stage 3 unspecified: Secondary | ICD-10-CM | POA: Insufficient documentation

## 2020-01-28 DIAGNOSIS — E039 Hypothyroidism, unspecified: Secondary | ICD-10-CM | POA: Diagnosis not present

## 2020-01-28 DIAGNOSIS — I739 Peripheral vascular disease, unspecified: Secondary | ICD-10-CM | POA: Diagnosis not present

## 2020-01-28 DIAGNOSIS — Z79890 Hormone replacement therapy (postmenopausal): Secondary | ICD-10-CM | POA: Diagnosis not present

## 2020-01-28 LAB — COMPREHENSIVE METABOLIC PANEL
ALT: 60 U/L — ABNORMAL HIGH (ref 0–44)
AST: 50 U/L — ABNORMAL HIGH (ref 15–41)
Albumin: 2.9 g/dL — ABNORMAL LOW (ref 3.5–5.0)
Alkaline Phosphatase: 227 U/L — ABNORMAL HIGH (ref 38–126)
Anion gap: 12 (ref 5–15)
BUN: 38 mg/dL — ABNORMAL HIGH (ref 6–20)
CO2: 24 mmol/L (ref 22–32)
Calcium: 9 mg/dL (ref 8.9–10.3)
Chloride: 101 mmol/L (ref 98–111)
Creatinine, Ser: 1.43 mg/dL — ABNORMAL HIGH (ref 0.44–1.00)
GFR calc Af Amer: 47 mL/min — ABNORMAL LOW (ref >60)
GFR calc non Af Amer: 41 mL/min — ABNORMAL LOW (ref >60)
Glucose, Bld: 163 mg/dL — ABNORMAL HIGH (ref 70–99)
Potassium: 4.1 mmol/L (ref 3.5–5.1)
Sodium: 137 mmol/L (ref 135–145)
Total Bilirubin: 0.3 mg/dL (ref 0.3–1.2)
Total Protein: 7 g/dL (ref 6.5–8.1)

## 2020-01-28 LAB — CBC
HCT: 36.7 % (ref 36.0–46.0)
Hemoglobin: 10.9 g/dL — ABNORMAL LOW (ref 12.0–15.0)
MCH: 26.6 pg (ref 26.0–34.0)
MCHC: 29.7 g/dL — ABNORMAL LOW (ref 30.0–36.0)
MCV: 89.5 fL (ref 80.0–100.0)
Platelets: 343 10*3/uL (ref 150–400)
RBC: 4.1 MIL/uL (ref 3.87–5.11)
RDW: 15.9 % — ABNORMAL HIGH (ref 11.5–15.5)
WBC: 10.4 10*3/uL (ref 4.0–10.5)
nRBC: 0 % (ref 0.0–0.2)

## 2020-01-28 LAB — PROTIME-INR
INR: 1.2 (ref 0.8–1.2)
Prothrombin Time: 15.4 seconds — ABNORMAL HIGH (ref 11.4–15.2)

## 2020-01-28 LAB — LIPID PANEL
Cholesterol: 134 mg/dL (ref 0–200)
HDL: 48 mg/dL (ref >40)
LDL Cholesterol: 64 mg/dL (ref 0–99)
Total CHOL/HDL Ratio: 2.8 RATIO
Triglycerides: 112 mg/dL (ref <150)
VLDL: 22 mg/dL (ref 0–40)

## 2020-01-28 MED ORDER — TORSEMIDE 20 MG PO TABS: ORAL_TABLET | ORAL | 3 refills | Status: DC

## 2020-01-28 MED ORDER — POTASSIUM CHLORIDE CRYS ER 20 MEQ PO TBCR: 20.0000 meq | EXTENDED_RELEASE_TABLET | Freq: Every day | ORAL | 3 refills | Status: DC

## 2020-01-28 MED ORDER — EMPAGLIFLOZIN 10 MG PO TABS: 10.0000 mg | ORAL_TABLET | Freq: Every day | ORAL | 5 refills | Status: DC

## 2020-01-28 MED ORDER — BISOPROLOL FUMARATE 5 MG PO TABS: 5.0000 mg | ORAL_TABLET | Freq: Every day | ORAL | 5 refills | Status: DC

## 2020-01-28 NOTE — Progress Notes (Signed)
Patient ID: [name], female   DOB: 1961/02/21, 59 y.o.   MRN: [id number]    Advanced Heart Failure Clinic Note   PCP: Dr Roxy Manns HF Cardiology: Dr. Aundra Dubin PV: Dr. Gwenlyn Found  [name] is a 59 y.o. with history of PAD, carotid stenosis s/p left CEA, relatively mild CAD, chronic systolic CHF, paroxysmal atrial fibrillation and prior substance abuse presents for CHF clinic followup.  She was admitted in 1/17 with acute hypoxemic respiratory failure in the setting of atrial fibrillation/RVR and volume overload.  She was initially intubated.  She converted back to NSR with amiodarone gtt.  She was treated with IV Lasix, steroids, bronchodilators.  She developed AKI and losartan was stopped.    She was admitted in 3/17 with symptomatic bradycardia.  She was supposed to stop diltiazem after this admission but continued to take it.  She presented later in 3/17, again with symptomatic bradycardia (junctional bradycardia), hypotension, and AKI.  Diltiazem and bisoprolol were stopped.  HR recovered and she has had no problems since.  ACEI was stopped with AKI.   In 5/18, she had peripheral angiography with atherectomy of right SFA.  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 7/18 ABIs showed 0.65 on right, 0.31 on left.  In 8/20, she had peripheral angiography showing totally occluded left SFA.  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 Donnetta Hutching.  ABIs in 2/21 were 0.44 on right, 0.99 on left.   Most recent echo in 1/21 showed EF 50%, mild LVH, normal RV, PASP 60 mmHg.   She is now mainly limited by leg pain.  Both legs hurt when she walks around her house.  No dyspnea walking in her house.  Not very active. No chest pain, no palpitations.  No orthopnea/PND.  She has an ulcer on her right lower leg. BP is elevated today.    ECG (personally reviewed): NSR, nonspecific T wave flattening  Labs (1/17): K 5.2, creatinine 1.4, AST 43, ALT  normal Labs (2/17): K 4.2, creatinine 1.3, BNP 607, TSH normal Labs (3/17): K 4.2, creatinine 1.45, AST/ALT normal, HCT 28.7 Labs (4/17): K 4.4, creatinine 1.61 Labs (08/31/2016): K 4.5 Creatinine 1.43  Labs (11/17): K 4.2, creatinine 1.4 Labs (9/18): K 4.1, creatinine 1.1, TSH normal Labs 04/13/2018: K 5 Creatinine 1.75 Labs (2/21): K 4.1, creatinine 1.58  PMH: 1. Carotid stenosis: Known occluded right carotid.  Left CEA in 4/16.  2. CAD: LHC in 12/15 with 80% stenosis in small OM1, nonobstructive disease in other territories.  3. Chronic systolic CHF: Nonischemic cardiomyopathy (?due to ETOH or prior drug abuse).  - Echo (1/17) with EF 45%, mild LV hypertrophy, moderate diastolic dysfunction, inferolateral severe hypokinesis, mildly decreased RV systolic function.  - Echo (1/18): EF 40-45%, mild LVH, normal RV size and systolic function.  - Echo (1/21): EF 50%, mild LVH, normal RV, PASP 60 mmHg.  4. Atrial fibrillation: Paroxysmal.   5. Type II diabetes 6. CKD: Stage III. 7. COPD: Quit smoking 1/17. On home oxygen.  8. 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  No longer drinks.  9. Hypothyroidism 10. PAD: Atherectomy SFA in 2014 (Dr Gwenlyn Found).  Peripheral arterial dopplers (2/17) with focal 75-99% proximal right SFA stenosis, occluded mid-distal right SFA, chronic occlusion of all runoff arteries on right. - In 5/18, she had peripheral angiography with atherectomy of right SFA.   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  - 7/18 ABIs showed 0.65 on right, 0.31 on left.   - Peripheral angiography 8/20: Totally occluded left SFA.  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 Donnetta Hutching.   - ABIs (2/21): 0.44 R, 0.99 L.  11. Anemia 12. Prior cocaine abuse.  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  14. OSA: Has not been able to tolerate CPAP.   SH: Lives with sister.  Prior cocaine abuse.   Prior ETOH abuse.  Quit smoking in 1/17, restarted, and quit again in 4/19.  FH: CAD  Review of systems complete and found to be negative unless listed in HPI.    Current Outpatient Medications  Medication Sig Dispense Refill  . acetaminophen (TYLENOL) 500 MG tablet Take 2 tablets (1,000 mg total) by mouth every 6 (six) hours as needed for mild pain. 30 tablet 0  . albuterol (VENTOLIN HFA) 108 (90 Base) MCG/ACT inhaler Inhale 2 puffs into the lungs every 6 (six) hours as needed for wheezing or shortness of breath. 18 g 0  . amiodarone (PACERONE) 100 MG tablet Take 1 tablet (100 mg total) by mouth daily. 30 tablet 3  . amLODipine (NORVASC) 10 MG tablet Take 1 tablet (10 mg total) by mouth daily. 30 tablet 3  . apixaban (ELIQUIS) 5 MG TABS tablet Take 1 tablet (5 mg total) by mouth 2 (two) times daily. 60 tablet 3  . atorvastatin (LIPITOR) 40 MG tablet TAKE 1 TABLET BY MOUTH ONCE EVERY EVENING 30 tablet 3  . bisoprolol (ZEBETA) 5 MG tablet Take 1 tablet (5 mg total) by mouth daily. 30 tablet 5  . budesonide-formoterol (SYMBICORT) 160-4.5 MCG/ACT inhaler Inhale 2 puffs into the lungs 2 (two) times daily. 1 Inhaler 2  . cholecalciferol (VITAMIN D) 1000 units tablet Take 1 tablet (1,000 Units total) by mouth daily.    . clopidogrel (PLAVIX) 75 MG tablet Take 1 tablet (75 mg total) by mouth daily. 30 tablet 3  . colchicine 0.6 MG tablet Take 0.6-1.2 mg by mouth See admin instructions. Take 1.2mg  by mouth initially at onset of gout flare up, then take 0.6mg  in one hour, then stop.    [name] docusate sodium (COLACE) 100 MG capsule Take 1 capsule (100 mg total) by mouth daily. 10 capsule 0  . folic acid (FOLVITE) 1 MG tablet Take 1 tablet (1 mg total) by mouth daily. 30 tablet 0  . Insulin Glargine (LANTUS SOLOSTAR) 100 UNIT/ML Solostar Pen Inject 40 Units into the skin at bedtime.    . Insulin Lispro (HUMALOG KWIKPEN) 200 UNIT/ML SOPN Inject 8 Units into the skin 3 (three) times daily with meals.    [name]  levothyroxine (SYNTHROID, LEVOTHROID) 50 MCG tablet Take 1 tablet (50 mcg total) by mouth daily. 30 tablet 0  . Multiple Vitamin (MULTIVITAMIN WITH MINERALS) TABS tablet Take 1 tablet by mouth daily. 30 tablet 0  . nortriptyline (PAMELOR) 50 MG capsule Take 50 mg by mouth at bedtime.    . pantoprazole (PROTONIX) 40 MG tablet Take 1 tablet (40 mg total) by mouth daily.    [name] PROLENSA 0.07 % SOLN Place 1 drop into the right eye daily.    [name] senna-docusate (SENOKOT-S) 8.6-50 MG tablet Take 2 tablets by mouth at bedtime as needed for mild constipation.    [name] spironolactone (ALDACTONE) 25 MG tablet Take 1 tablet (25 mg total) by mouth daily. 90 tablet 3  . tiotropium (SPIRIVA HANDIHALER) 18 MCG  inhalation capsule Place 1 capsule (18 mcg total) into inhaler and inhale every morning. 30 capsule 2  . torsemide (DEMADEX) 20 MG tablet Take 3 tablets (60 mg total) by mouth every morning AND 2 tablets (40 mg total) every evening. 150 tablet 3  . empagliflozin (JARDIANCE) 10 MG TABS tablet Take 10 mg by mouth daily before breakfast. 30 tablet 5  . potassium chloride SA (KLOR-CON) 20 MEQ tablet Take 1 tablet (20 mEq total) by mouth daily. 90 tablet 3   No current facility-administered medications for this encounter.   Vitals:   01/28/20 0939  BP: (!) 150/60  Pulse: 83  SpO2: 96%  Weight: 101 kg (222 lb 9.6 oz)     Wt Readings from Last 3 Encounters:  01/28/20 101 kg (222 lb 9.6 oz)  12/17/19 99.6 kg (219 lb 9.6 oz)  12/09/19 101.5 kg (223 lb 11.2 oz)    Physical Exam  General: NAD Neck: JVP 8-9 cm with HJR, no thyromegaly or thyroid nodule.  Lungs: Clear to auscultation bilaterally with normal respiratory effort. CV: Nondisplaced PMI.  Heart regular S1/S2, no S3/S4, no murmur.  1+ edema 1/2 up lower legs bilaterally.  No carotid bruit.  Unable to palpate pedal pulses.  Abdomen: Soft, nontender, no hepatosplenomegaly, no distention.  Skin: Intact without lesions or rashes.  Neurologic: Alert and  oriented x 3.  Psych: Normal affect. Extremities: No clubbing or cyanosis. Ulceration on right lower leg (dressed).  HEENT: Normal.   Assessment/Plan: 1. Chronic diastolic CHF: EF 06% on last echo in 1/21. Has had presumed nonischemic given LHC in 12/15 showing only 80% stenosis in small OM1.  EF as low as 40-45% in the past.  She is volume overloaded today on exam.  Not very active, NYHA class III symptoms.  - Increase torsemide to 60 mg bid x 2 days then torsemide 60 qam/40 qpm with BMET today and in 10 days.  - Increase bisoprolol to 5 mg daily with elevated BP. - Continue spironolactone 25 mg daily.  - I am going to set her up for Cardiomems implantation.  We discussed risks/benefits of procedure and she agrees.  2. Atrial fibrillation: Paroxysmal. She is in NSR today.  - Continue amiodarone 100 mg daily. Check LFTs today.  He is on levothyroxine, followed by PCP.  Needs regular eye exams. - Continue Eliquis 5 mg bid.   3. PAD: Claudication bilaterally.  Not candidate for cilostazol with CHF.  She has had a left fem-pop bypass in 8/20.  ABIs in 2/21 were very abnormal on right.  She also has an ulceration on her right lower leg.  - She is still on Plavix along with Eliquis, will ask Dr. Gwenlyn Found how long this needs to continue.  - Followup with Drs Gwenlyn Found and Early for PAD.  - Continue atorvastatin, check lipids today.  - I will refer her to wound clinic to follow the right lower leg ulceration.  4. COPD:  Continues to smoke, encouraged to quit.   5. H/o cirrhosis: Likely from ETOH, now abstinent.  6. OSA: Not able to tolerate CPAP.  - Refer to Dr. Radford Pax to see if there is a form of CPAP that she can tolerate.  7. CKD stage III:  BMET today. 8. HTN: BP high, increasing bisoprolol to 5 mg daily.  9.Type 2 diabetes: With vascular disease, CHF, and diabetes, she would be a good candidate for SGLT2-inhibitor.  - Start Jardiance 10 mg daily.  10. CAD: LHC in 12/15 with 80%  stenosis in small  OM1, nonobstructive disease in other territories. - Continue statin.   Followup with PA in 1 month.   Loralie Champagne, MD  01/28/2020

## 2020-01-28 NOTE — Patient Instructions (Addendum)
INCREASE Torsemide to 60mg  (3 tabs) twice a day for 2 days THEN TAKE Torsemide 60mg  (3 tabs) in the morning and 40mg  (2 tabs) in the evening   START Jardiance 10mg  (1 tab) daily   START Potassium 20 meq (1 tab) daily   INCREASE Bisoprolol to 5mg  (1 tab) daily   You have been referred to the wound clinic and Dr Radford Pax (sleep medicine). They will call you with an appointment.    Labs today and repeat in 10 days We will only contact you if something comes back abnormal or we need to make some changes. Otherwise no news is good news!  Your physician has requested that you have an echocardiogram. Echocardiography is a painless test that uses sound waves to create images of your heart. It provides your doctor with information about the size and shape of your heart and how well your heart's chambers and valves are working. This procedure takes approximately one hour. There are no restrictions for this procedure.   Your physician recommends that you schedule a follow-up appointment in: 1 month with the Nurse Practitioner  Please call office at [phone] option 2 if you have any questions or concerns.   At the [facility], you and your health needs are our priority. As part of our continuing mission to provide you with exceptional heart care, we have created designated Provider Care Teams. These Care Teams include your primary Cardiologist (physician) and Advanced Practice Providers (APPs- Physician Assistants and Nurse Practitioners) who all work together to provide you with the care you need, when you need it.   You may see any of the following providers on your designated Care Team at your next follow up: [name] Dr Glori Bickers . Dr Loralie Champagne . Darrick Grinder, NP . Lyda Jester, PA . Audry Riles, PharmD   Please be sure to bring in all your medications bottles to every appointment.

## 2020-01-28 NOTE — H&P (View-Only) (Signed)
Patient ID: [name], female   DOB: Jan 21, 1961, 59 y.o.   MRN: [id number]    Advanced Heart Failure Clinic Note   PCP: Dr Roxy Manns HF Cardiology: Dr. Aundra Dubin PV: Dr. Gwenlyn Found  [name] is a 59 y.o. with history of PAD, carotid stenosis s/p left CEA, relatively mild CAD, chronic systolic CHF, paroxysmal atrial fibrillation and prior substance abuse presents for CHF clinic followup.  She was admitted in 1/17 with acute hypoxemic respiratory failure in the setting of atrial fibrillation/RVR and volume overload.  She was initially intubated.  She converted back to NSR with amiodarone gtt.  She was treated with IV Lasix, steroids, bronchodilators.  She developed AKI and losartan was stopped.    She was admitted in 3/17 with symptomatic bradycardia.  She was supposed to stop diltiazem after this admission but continued to take it.  She presented later in 3/17, again with symptomatic bradycardia (junctional bradycardia), hypotension, and AKI.  Diltiazem and bisoprolol were stopped.  HR recovered and she has had no problems since.  ACEI was stopped with AKI.   In 5/18, she had peripheral angiography with atherectomy of right SFA.  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 7/18 ABIs showed 0.65 on right, 0.31 on left.  In 8/20, she had peripheral angiography showing totally occluded left SFA.  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 Donnetta Hutching.  ABIs in 2/21 were 0.44 on right, 0.99 on left.   Most recent echo in 1/21 showed EF 50%, mild LVH, normal RV, PASP 60 mmHg.   She is now mainly limited by leg pain.  Both legs hurt when she walks around her house.  No dyspnea walking in her house.  Not very active. No chest pain, no palpitations.  No orthopnea/PND.  She has an ulcer on her right lower leg. BP is elevated today.    ECG (personally reviewed): NSR, nonspecific T wave flattening  Labs (1/17): K 5.2, creatinine 1.4, AST 43, ALT  normal Labs (2/17): K 4.2, creatinine 1.3, BNP 607, TSH normal Labs (3/17): K 4.2, creatinine 1.45, AST/ALT normal, HCT 28.7 Labs (4/17): K 4.4, creatinine 1.61 Labs (08/31/2016): K 4.5 Creatinine 1.43  Labs (11/17): K 4.2, creatinine 1.4 Labs (9/18): K 4.1, creatinine 1.1, TSH normal Labs 04/13/2018: K 5 Creatinine 1.75 Labs (2/21): K 4.1, creatinine 1.58  PMH: 1. Carotid stenosis: Known occluded right carotid.  Left CEA in 4/16.  2. CAD: LHC in 12/15 with 80% stenosis in small OM1, nonobstructive disease in other territories.  3. Chronic systolic CHF: Nonischemic cardiomyopathy (?due to ETOH or prior drug abuse).  - Echo (1/17) with EF 45%, mild LV hypertrophy, moderate diastolic dysfunction, inferolateral severe hypokinesis, mildly decreased RV systolic function.  - Echo (1/18): EF 40-45%, mild LVH, normal RV size and systolic function.  - Echo (1/21): EF 50%, mild LVH, normal RV, PASP 60 mmHg.  4. Atrial fibrillation: Paroxysmal.   5. Type II diabetes 6. CKD: Stage III. 7. COPD: Quit smoking 1/17. On home oxygen.  8. 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  No longer drinks.  9. Hypothyroidism 10. PAD: Atherectomy SFA in 2014 (Dr Gwenlyn Found).  Peripheral arterial dopplers (2/17) with focal 75-99% proximal right SFA stenosis, occluded mid-distal right SFA, chronic occlusion of all runoff arteries on right. - In 5/18, she had peripheral angiography with atherectomy of right SFA.   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  - 7/18 ABIs showed 0.65 on right, 0.31 on left.   - Peripheral angiography 8/20: Totally occluded left SFA.  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 Donnetta Hutching.   - ABIs (2/21): 0.44 R, 0.99 L.  11. Anemia 12. Prior cocaine abuse.  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  14. OSA: Has not been able to tolerate CPAP.   SH: Lives with sister.  Prior cocaine abuse.   Prior ETOH abuse.  Quit smoking in 1/17, restarted, and quit again in 4/19.  FH: CAD  Review of systems complete and found to be negative unless listed in HPI.    Current Outpatient Medications  Medication Sig Dispense Refill  . acetaminophen (TYLENOL) 500 MG tablet Take 2 tablets (1,000 mg total) by mouth every 6 (six) hours as needed for mild pain. 30 tablet 0  . albuterol (VENTOLIN HFA) 108 (90 Base) MCG/ACT inhaler Inhale 2 puffs into the lungs every 6 (six) hours as needed for wheezing or shortness of breath. 18 g 0  . amiodarone (PACERONE) 100 MG tablet Take 1 tablet (100 mg total) by mouth daily. 30 tablet 3  . amLODipine (NORVASC) 10 MG tablet Take 1 tablet (10 mg total) by mouth daily. 30 tablet 3  . apixaban (ELIQUIS) 5 MG TABS tablet Take 1 tablet (5 mg total) by mouth 2 (two) times daily. 60 tablet 3  . atorvastatin (LIPITOR) 40 MG tablet TAKE 1 TABLET BY MOUTH ONCE EVERY EVENING 30 tablet 3  . bisoprolol (ZEBETA) 5 MG tablet Take 1 tablet (5 mg total) by mouth daily. 30 tablet 5  . budesonide-formoterol (SYMBICORT) 160-4.5 MCG/ACT inhaler Inhale 2 puffs into the lungs 2 (two) times daily. 1 Inhaler 2  . cholecalciferol (VITAMIN D) 1000 units tablet Take 1 tablet (1,000 Units total) by mouth daily.    . clopidogrel (PLAVIX) 75 MG tablet Take 1 tablet (75 mg total) by mouth daily. 30 tablet 3  . colchicine 0.6 MG tablet Take 0.6-1.2 mg by mouth See admin instructions. Take 1.2mg  by mouth initially at onset of gout flare up, then take 0.6mg  in one hour, then stop.    [name] docusate sodium (COLACE) 100 MG capsule Take 1 capsule (100 mg total) by mouth daily. 10 capsule 0  . folic acid (FOLVITE) 1 MG tablet Take 1 tablet (1 mg total) by mouth daily. 30 tablet 0  . Insulin Glargine (LANTUS SOLOSTAR) 100 UNIT/ML Solostar Pen Inject 40 Units into the skin at bedtime.    . Insulin Lispro (HUMALOG KWIKPEN) 200 UNIT/ML SOPN Inject 8 Units into the skin 3 (three) times daily with meals.    [name]  levothyroxine (SYNTHROID, LEVOTHROID) 50 MCG tablet Take 1 tablet (50 mcg total) by mouth daily. 30 tablet 0  . Multiple Vitamin (MULTIVITAMIN WITH MINERALS) TABS tablet Take 1 tablet by mouth daily. 30 tablet 0  . nortriptyline (PAMELOR) 50 MG capsule Take 50 mg by mouth at bedtime.    . pantoprazole (PROTONIX) 40 MG tablet Take 1 tablet (40 mg total) by mouth daily.    [name] PROLENSA 0.07 % SOLN Place 1 drop into the right eye daily.    [name] senna-docusate (SENOKOT-S) 8.6-50 MG tablet Take 2 tablets by mouth at bedtime as needed for mild constipation.    [name] spironolactone (ALDACTONE) 25 MG tablet Take 1 tablet (25 mg total) by mouth daily. 90 tablet 3  . tiotropium (SPIRIVA HANDIHALER) 18 MCG  inhalation capsule Place 1 capsule (18 mcg total) into inhaler and inhale every morning. 30 capsule 2  . torsemide (DEMADEX) 20 MG tablet Take 3 tablets (60 mg total) by mouth every morning AND 2 tablets (40 mg total) every evening. 150 tablet 3  . empagliflozin (JARDIANCE) 10 MG TABS tablet Take 10 mg by mouth daily before breakfast. 30 tablet 5  . potassium chloride SA (KLOR-CON) 20 MEQ tablet Take 1 tablet (20 mEq total) by mouth daily. 90 tablet 3   No current facility-administered medications for this encounter.   Vitals:   01/28/20 0939  BP: (!) 150/60  Pulse: 83  SpO2: 96%  Weight: 101 kg (222 lb 9.6 oz)     Wt Readings from Last 3 Encounters:  01/28/20 101 kg (222 lb 9.6 oz)  12/17/19 99.6 kg (219 lb 9.6 oz)  12/09/19 101.5 kg (223 lb 11.2 oz)    Physical Exam  General: NAD Neck: JVP 8-9 cm with HJR, no thyromegaly or thyroid nodule.  Lungs: Clear to auscultation bilaterally with normal respiratory effort. CV: Nondisplaced PMI.  Heart regular S1/S2, no S3/S4, no murmur.  1+ edema 1/2 up lower legs bilaterally.  No carotid bruit.  Unable to palpate pedal pulses.  Abdomen: Soft, nontender, no hepatosplenomegaly, no distention.  Skin: Intact without lesions or rashes.  Neurologic: Alert and  oriented x 3.  Psych: Normal affect. Extremities: No clubbing or cyanosis. Ulceration on right lower leg (dressed).  HEENT: Normal.   Assessment/Plan: 1. Chronic diastolic CHF: EF 95% on last echo in 1/21. Has had presumed nonischemic given LHC in 12/15 showing only 80% stenosis in small OM1.  EF as low as 40-45% in the past.  She is volume overloaded today on exam.  Not very active, NYHA class III symptoms.  - Increase torsemide to 60 mg bid x 2 days then torsemide 60 qam/40 qpm with BMET today and in 10 days.  - Increase bisoprolol to 5 mg daily with elevated BP. - Continue spironolactone 25 mg daily.  - I am going to set her up for Cardiomems implantation.  We discussed risks/benefits of procedure and she agrees.  2. Atrial fibrillation: Paroxysmal. She is in NSR today.  - Continue amiodarone 100 mg daily. Check LFTs today.  He is on levothyroxine, followed by PCP.  Needs regular eye exams. - Continue Eliquis 5 mg bid.   3. PAD: Claudication bilaterally.  Not candidate for cilostazol with CHF.  She has had a left fem-pop bypass in 8/20.  ABIs in 2/21 were very abnormal on right.  She also has an ulceration on her right lower leg.  - She is still on Plavix along with Eliquis, will ask Dr. Gwenlyn Found how long this needs to continue.  - Followup with Drs Gwenlyn Found and Early for PAD.  - Continue atorvastatin, check lipids today.  - I will refer her to wound clinic to follow the right lower leg ulceration.  4. COPD:  Continues to smoke, encouraged to quit.   5. H/o cirrhosis: Likely from ETOH, now abstinent.  6. OSA: Not able to tolerate CPAP.  - Refer to Dr. Radford Pax to see if there is a form of CPAP that she can tolerate.  7. CKD stage III:  BMET today. 8. HTN: BP high, increasing bisoprolol to 5 mg daily.  9.Type 2 diabetes: With vascular disease, CHF, and diabetes, she would be a good candidate for SGLT2-inhibitor.  - Start Jardiance 10 mg daily.  10. CAD: LHC in 12/15 with 80%  stenosis in small  OM1, nonobstructive disease in other territories. - Continue statin.   Followup with PA in 1 month.   Loralie Champagne, MD  01/28/2020

## 2020-01-28 NOTE — Telephone Encounter (Signed)
CSW informed that pt is getting evicted from her apartment due to her having pets and is looking for alternative housing.  CSW called pt to follow up and see if she needs assistance.  Pt reports she is going to tour some apartments tomorrow for people over 51 and was told there were a lot of available options so she is hopeful that will work out- CSW encouraged pt to call if she was unable to get in with those apartments and needed assistance finding other options.  CSW also inquired if the pt had been able to get her COVID-19 vaccine yet- pt reports she has gotten the first dose and is already scheduled for the 2nd dose.  CSW will continue to follow and assist as needed  Jorge Ny, [facility] Desk#: [phone] Cell#: [phone]

## 2020-01-29 ENCOUNTER — Telehealth (HOSPITAL_COMMUNITY): Payer: Self-pay

## 2020-01-29 ENCOUNTER — Ambulatory Visit: Payer: Medicare Other | Admitting: Cardiovascular Disease

## 2020-01-29 DIAGNOSIS — I5042 Chronic combined systolic (congestive) and diastolic (congestive) heart failure: Secondary | ICD-10-CM

## 2020-01-29 NOTE — Telephone Encounter (Signed)
Pt aware of results. Pt has an appt 3/26 for bmet. Will add LFT's to visit. Pt wheelchair dependent

## 2020-01-29 NOTE — Telephone Encounter (Signed)
-----   Message from Larey Dresser, MD sent at 01/28/2020  4:14 PM EDT ----- LFTs elevated, excellent lipids, creatinine stable.  Need workup of elevated LFTs, repeat LFTs in 2 wks to make sure still high.

## 2020-01-30 ENCOUNTER — Telehealth: Payer: Medicare Other | Admitting: Cardiology

## 2020-01-31 ENCOUNTER — Other Ambulatory Visit (HOSPITAL_COMMUNITY): Payer: Self-pay

## 2020-01-31 DIAGNOSIS — I5042 Chronic combined systolic (congestive) and diastolic (congestive) heart failure: Secondary | ICD-10-CM

## 2020-01-31 MED ORDER — SODIUM CHLORIDE 0.9% FLUSH: 3.0000 mL | Freq: Two times a day (BID) | INTRAVENOUS | Status: DC

## 2020-02-03 ENCOUNTER — Other Ambulatory Visit (HOSPITAL_COMMUNITY): Payer: Self-pay | Admitting: *Deleted

## 2020-02-03 ENCOUNTER — Other Ambulatory Visit (HOSPITAL_COMMUNITY)
Admission: RE | Admit: 2020-02-03 | Discharge: 2020-02-03 | Disposition: A | Discharge status: Home / Self Care | Payer: Medicare Other | Source: Ambulatory Visit | Attending: Cardiology | Admitting: Cardiology

## 2020-02-03 DIAGNOSIS — Z20822 Contact with and (suspected) exposure to covid-19: Secondary | ICD-10-CM | POA: Diagnosis not present

## 2020-02-03 DIAGNOSIS — Z01812 Encounter for preprocedural laboratory examination: Secondary | ICD-10-CM | POA: Insufficient documentation

## 2020-02-03 LAB — SARS CORONAVIRUS 2 (TAT 6-24 HRS): SARS Coronavirus 2: NEGATIVE

## 2020-02-03 MED ORDER — EMPAGLIFLOZIN 10 MG PO TABS: 10.0000 mg | ORAL_TABLET | Freq: Every day | ORAL | 6 refills | Status: DC

## 2020-02-03 NOTE — Addendum Note (Signed)
Addended by: Scarlette Calico on: 02/03/2020 08:10 AM   Modules accepted: Orders

## 2020-02-05 ENCOUNTER — Encounter (HOSPITAL_COMMUNITY)
Admission: RE | Disposition: A | Discharge status: Home / Self Care | Payer: Self-pay | Source: Home / Self Care | Attending: Cardiology

## 2020-02-05 ENCOUNTER — Ambulatory Visit (HOSPITAL_COMMUNITY)
Admission: RE | Admit: 2020-02-05 | Discharge: 2020-02-05 | Disposition: A | Discharge status: Home / Self Care | Payer: Medicare Other | Attending: Cardiology | Admitting: Cardiology

## 2020-02-05 ENCOUNTER — Other Ambulatory Visit: Payer: Self-pay

## 2020-02-05 DIAGNOSIS — K7460 Unspecified cirrhosis of liver: Secondary | ICD-10-CM | POA: Insufficient documentation

## 2020-02-05 DIAGNOSIS — G4733 Obstructive sleep apnea (adult) (pediatric): Secondary | ICD-10-CM | POA: Diagnosis not present

## 2020-02-05 DIAGNOSIS — E1151 Type 2 diabetes mellitus with diabetic peripheral angiopathy without gangrene: Secondary | ICD-10-CM | POA: Insufficient documentation

## 2020-02-05 DIAGNOSIS — E039 Hypothyroidism, unspecified: Secondary | ICD-10-CM | POA: Diagnosis not present

## 2020-02-05 DIAGNOSIS — I130 Hypertensive heart and chronic kidney disease with heart failure and stage 1 through stage 4 chronic kidney disease, or unspecified chronic kidney disease: Secondary | ICD-10-CM | POA: Insufficient documentation

## 2020-02-05 DIAGNOSIS — Z7951 Long term (current) use of inhaled steroids: Secondary | ICD-10-CM | POA: Diagnosis not present

## 2020-02-05 DIAGNOSIS — I6523 Occlusion and stenosis of bilateral carotid arteries: Secondary | ICD-10-CM | POA: Diagnosis not present

## 2020-02-05 DIAGNOSIS — Z7902 Long term (current) use of antithrombotics/antiplatelets: Secondary | ICD-10-CM | POA: Diagnosis not present

## 2020-02-05 DIAGNOSIS — I5042 Chronic combined systolic (congestive) and diastolic (congestive) heart failure: Secondary | ICD-10-CM | POA: Diagnosis not present

## 2020-02-05 DIAGNOSIS — F17210 Nicotine dependence, cigarettes, uncomplicated: Secondary | ICD-10-CM | POA: Diagnosis not present

## 2020-02-05 DIAGNOSIS — N183 Chronic kidney disease, stage 3 unspecified: Secondary | ICD-10-CM | POA: Diagnosis not present

## 2020-02-05 DIAGNOSIS — Z7901 Long term (current) use of anticoagulants: Secondary | ICD-10-CM | POA: Insufficient documentation

## 2020-02-05 DIAGNOSIS — Z794 Long term (current) use of insulin: Secondary | ICD-10-CM | POA: Insufficient documentation

## 2020-02-05 DIAGNOSIS — E1122 Type 2 diabetes mellitus with diabetic chronic kidney disease: Secondary | ICD-10-CM | POA: Diagnosis not present

## 2020-02-05 DIAGNOSIS — J449 Chronic obstructive pulmonary disease, unspecified: Secondary | ICD-10-CM | POA: Insufficient documentation

## 2020-02-05 DIAGNOSIS — Z79899 Other long term (current) drug therapy: Secondary | ICD-10-CM | POA: Insufficient documentation

## 2020-02-05 DIAGNOSIS — I2510 Atherosclerotic heart disease of native coronary artery without angina pectoris: Secondary | ICD-10-CM | POA: Diagnosis not present

## 2020-02-05 DIAGNOSIS — I509 Heart failure, unspecified: Secondary | ICD-10-CM | POA: Diagnosis not present

## 2020-02-05 DIAGNOSIS — Z9981 Dependence on supplemental oxygen: Secondary | ICD-10-CM | POA: Insufficient documentation

## 2020-02-05 DIAGNOSIS — I480 Paroxysmal atrial fibrillation: Secondary | ICD-10-CM | POA: Insufficient documentation

## 2020-02-05 HISTORY — PX: PRESSURE SENSOR/CARDIOMEMS: CATH118258

## 2020-02-05 LAB — GLUCOSE, CAPILLARY
Glucose-Capillary: 220 mg/dL — ABNORMAL HIGH (ref 70–99)
Glucose-Capillary: 216 mg/dL — ABNORMAL HIGH (ref 70–99)

## 2020-02-05 LAB — POCT I-STAT EG7
Acid-Base Excess: 6 mmol/L — ABNORMAL HIGH (ref 0.0–2.0)
Acid-Base Excess: 6 mmol/L — ABNORMAL HIGH (ref 0.0–2.0)
Bicarbonate: 32.1 mmol/L — ABNORMAL HIGH (ref 20.0–28.0)
Bicarbonate: 32.8 mmol/L — ABNORMAL HIGH (ref 20.0–28.0)
Calcium, Ion: 1.17 mmol/L (ref 1.15–1.40)
Calcium, Ion: 1.18 mmol/L (ref 1.15–1.40)
HCT: 36 % (ref 36.0–46.0)
HCT: 36 % (ref 36.0–46.0)
Hemoglobin: 12.2 g/dL (ref 12.0–15.0)
Hemoglobin: 12.2 g/dL (ref 12.0–15.0)
O2 Saturation: 59 %
O2 Saturation: 63 %
Potassium: 4.2 mmol/L (ref 3.5–5.1)
Potassium: 4.2 mmol/L (ref 3.5–5.1)
Sodium: 138 mmol/L (ref 135–145)
Sodium: 138 mmol/L (ref 135–145)
TCO2: 34 mmol/L — ABNORMAL HIGH (ref 22–32)
TCO2: 34 mmol/L — ABNORMAL HIGH (ref 22–32)
pCO2, Ven: 54.2 mmHg (ref 44.0–60.0)
pCO2, Ven: 54.5 mmHg (ref 44.0–60.0)
pH, Ven: 7.381 (ref 7.250–7.430)
pH, Ven: 7.388 (ref 7.250–7.430)
pO2, Ven: 32 mmHg (ref 32.0–45.0)
pO2, Ven: 34 mmHg (ref 32.0–45.0)

## 2020-02-05 SURGERY — PRESSURE SENSOR/CARDIOMEMS
Anesthesia: LOCAL

## 2020-02-05 MED ORDER — HEPARIN (PORCINE) IN NACL 1000-0.9 UT/500ML-% IV SOLN
INTRAVENOUS | Status: DC | PRN
  Administered 2020-02-05: 500 mL

## 2020-02-05 MED ORDER — FENTANYL CITRATE (PF) 100 MCG/2ML IJ SOLN
INTRAMUSCULAR | Status: AC
  Filled 2020-02-05: qty 2

## 2020-02-05 MED ORDER — MIDAZOLAM HCL 2 MG/2ML IJ SOLN
INTRAMUSCULAR | Status: DC | PRN
  Administered 2020-02-05 (×2): 1 mg via INTRAVENOUS

## 2020-02-05 MED ORDER — HEPARIN (PORCINE) IN NACL 1000-0.9 UT/500ML-% IV SOLN
INTRAVENOUS | Status: AC
  Filled 2020-02-05: qty 500

## 2020-02-05 MED ORDER — SODIUM CHLORIDE 0.9% FLUSH: 3.0000 mL | INTRAVENOUS | Status: DC | PRN

## 2020-02-05 MED ORDER — HYDRALAZINE HCL 20 MG/ML IJ SOLN: 10.0000 mg | INTRAMUSCULAR | Status: DC | PRN

## 2020-02-05 MED ORDER — FENTANYL CITRATE (PF) 100 MCG/2ML IJ SOLN
INTRAMUSCULAR | Status: DC | PRN
  Administered 2020-02-05: 25 ug via INTRAVENOUS

## 2020-02-05 MED ORDER — SODIUM CHLORIDE 0.9% FLUSH: 3.0000 mL | Freq: Two times a day (BID) | INTRAVENOUS | Status: DC

## 2020-02-05 MED ORDER — LABETALOL HCL 5 MG/ML IV SOLN: 10.0000 mg | INTRAVENOUS | Status: DC | PRN

## 2020-02-05 MED ORDER — LIDOCAINE HCL (PF) 1 % IJ SOLN
INTRAMUSCULAR | Status: DC | PRN
  Administered 2020-02-05: 10 mL

## 2020-02-05 MED ORDER — SODIUM CHLORIDE 0.9 % IV SOLN: 250.0000 mL | INTRAVENOUS | Status: DC | PRN

## 2020-02-05 MED ORDER — ONDANSETRON HCL 4 MG/2ML IJ SOLN: 4.0000 mg | Freq: Four times a day (QID) | INTRAMUSCULAR | Status: DC | PRN

## 2020-02-05 MED ORDER — MIDAZOLAM HCL 2 MG/2ML IJ SOLN
INTRAMUSCULAR | Status: AC
  Filled 2020-02-05: qty 2

## 2020-02-05 MED ORDER — IOHEXOL 350 MG/ML SOLN
INTRAVENOUS | Status: DC | PRN
  Administered 2020-02-05: 10 mL

## 2020-02-05 MED ORDER — SODIUM CHLORIDE 0.9 % IV SOLN: INTRAVENOUS | Status: DC

## 2020-02-05 MED ORDER — ASPIRIN 81 MG PO CHEW
81.0000 mg | CHEWABLE_TABLET | ORAL | Status: AC
  Administered 2020-02-05: 81 mg via ORAL
  Filled 2020-02-05: qty 1

## 2020-02-05 MED ORDER — ACETAMINOPHEN 325 MG PO TABS
650.0000 mg | ORAL_TABLET | ORAL | Status: DC | PRN
  Administered 2020-02-05: 650 mg via ORAL
  Filled 2020-02-05: qty 2

## 2020-02-05 MED ORDER — LIDOCAINE HCL (PF) 1 % IJ SOLN
INTRAMUSCULAR | Status: AC
  Filled 2020-02-05: qty 30

## 2020-02-05 SURGICAL SUPPLY — 10 items
CARDIOMEMS PA SENSOR W/DELIVER (Prosthesis & Implant Heart) ×2 IMPLANT
CATH SWAN GANZ 7F STRAIGHT (CATHETERS) ×1 IMPLANT
KIT DILATOR VASC 18G NDL (KITS) ×1 IMPLANT
KIT HEART LEFT (KITS) ×1 IMPLANT
SENSOR CARDIOMEMS PA W/DELIVER (Prosthesis & Implant Heart) IMPLANT
SHEATH FAST CATH 12F 12CM (SHEATH) ×1 IMPLANT
SHEATH PROBE COVER 6X72 (BAG) ×1 IMPLANT
WIRE EMERALD 3MM-J .025X260CM (WIRE) ×1 IMPLANT
WIRE NITREX .018X300 STIFF (WIRE) ×1 IMPLANT
WIRE PT2 LS 185 (WIRE) IMPLANT

## 2020-02-05 NOTE — Discharge Instructions (Signed)
Moderate Conscious Sedation, Adult, Care After These instructions provide you with information about caring for yourself after your procedure. Your health care provider may also give you more specific instructions. Your treatment has been planned according to current medical practices, but problems sometimes occur. Call your health care provider if you have any problems or questions after your procedure. What can I expect after the procedure? After your procedure, it is common:  To feel sleepy for several hours.  To feel clumsy and have poor balance for several hours.  To have poor judgment for several hours.  To vomit if you eat too soon. Follow these instructions at home: For at least 24 hours after the procedure:   Do not: ? Participate in activities where you could fall or become injured. ? Drive. ? Use heavy machinery. ? Drink alcohol. ? Take sleeping pills or medicines that cause drowsiness. ? Make important decisions or sign legal documents. ? Take care of children on your own.  Rest. Eating and drinking  Follow the diet recommended by your health care provider.  If you vomit: ? Drink water, juice, or soup when you can drink without vomiting. ? Make sure you have little or no nausea before eating solid foods. General instructions  Have a responsible adult stay with you until you are awake and alert.  Take over-the-counter and prescription medicines only as told by your health care provider.  If you smoke, do not smoke without supervision.  Keep all follow-up visits as told by your health care provider. This is important. Contact a health care provider if:  You keep feeling nauseous or you keep vomiting.  You feel light-headed.  You develop a rash.  You have a fever. Get help right away if:  You have trouble breathing. This information is not intended to replace advice given to you by your health care provider. Make sure you discuss any questions you have  with your health care provider. Document Revised: 10/13/2017 Document Reviewed: 02/20/2016 Elsevier Patient Education  [place] apixaban tomorrow morning (3/25).  Femoral Site Care This sheet gives you information about how to care for yourself after your procedure. Your health care provider may also give you more specific instructions. If you have problems or questions, contact your health care provider. What can I expect after the procedure? After the procedure, it is common to have:  Bruising that usually fades within 1-2 weeks.  Tenderness at the site. Follow these instructions at home: Wound care  Follow instructions from your health care provider about how to take care of your insertion site. Make sure you: ? Wash your hands with soap and water before you change your bandage (dressing). If soap and water are not available, use hand sanitizer. ? Change your dressing as told by your health care provider. ? Leave stitches (sutures), skin glue, or adhesive strips in place. These skin closures may need to stay in place for 2 weeks or longer. If adhesive strip edges start to loosen and curl up, you may trim the loose edges. Do not remove adhesive strips completely unless your health care provider tells you to do that.  Do not take baths, swim, or use a hot tub until your health care provider approves.  You may shower 24-48 hours after the procedure or as told by your health care provider. ? Gently wash the site with plain soap and water. ? Pat the area dry with a clean towel. ? Do not rub the  site. This may cause bleeding.  Do not apply powder or lotion to the site. Keep the site clean and dry.  Check your femoral site every day for signs of infection. Check for: ? Redness, swelling, or pain. ? Fluid or blood. ? Warmth. ? Pus or a bad smell. Activity  For the first 2-3 days after your procedure, or as long as directed: ? Avoid climbing stairs as much as  possible. ? Do not squat.  Do not lift anything that is heavier than 10 lb (4.5 kg), or the limit that you are told, until your health care provider says that it is safe.  Rest as directed. ? Avoid sitting for a long time without moving. Get up to take short walks every 1-2 hours.  Do not drive for 24 hours if you were given a medicine to help you relax (sedative). General instructions  Take over-the-counter and prescription medicines only as told by your health care provider.  Keep all follow-up visits as told by your health care provider. This is important. Contact a health care provider if you have:  A fever or chills.  You have redness, swelling, or pain around your insertion site. Get help right away if:  The catheter insertion area swells very fast.  You pass out.  You suddenly start to sweat or your skin gets clammy.  The catheter insertion area is bleeding, and the bleeding does not stop when you hold steady pressure on the area.  The area near or just beyond the catheter insertion site becomes pale, cool, tingly, or numb. These symptoms may represent a serious problem that is an emergency. Do not wait to see if the symptoms will go away. Get medical help right away. Call your local emergency services (911 in the [place] Do not drive yourself to the hospital. Summary  After the procedure, it is common to have bruising that usually fades within 1-2 weeks.  Check your femoral site every day for signs of infection.  Do not lift anything that is heavier than 10 lb (4.5 kg), or the limit that you are told, until your health care provider says that it is safe. This information is not intended to replace advice given to you by your health care provider. Make sure you discuss any questions you have with your health care provider. Document Revised: 11/13/2017 Document Reviewed: 11/13/2017 Elsevier Patient Education  2020 [employer].

## 2020-02-05 NOTE — Interval H&P Note (Signed)
History and Physical Interval Note:  02/05/2020 11:57 AM  [name]  has presented today for surgery, with the diagnosis of heart failure.  The various methods of treatment have been discussed with the patient and family. After consideration of risks, benefits and other options for treatment, the patient has consented to  Procedure(s): PRESSURE SENSOR/CARDIOMEMS (N/A) as a surgical intervention.  The patient's history has been reviewed, patient examined, no change in status, stable for surgery.  I have reviewed the patient's chart and labs.  Questions were answered to the patient's satisfaction.     Alexandr Yaworski [employer]

## 2020-02-05 NOTE — Progress Notes (Signed)
Attempted to call Pt cousin Tammy left a message for her to call us back

## 2020-02-05 NOTE — Progress Notes (Signed)
Bleeding noted on dressing area assessed and new dressing applied

## 2020-02-05 NOTE — Progress Notes (Signed)
Site Area: right femoral venous Site Prior to Removal: Level  0 Pressure Applied For:  15   minutes Manual: yes Patient Status During Pull: stable Post Pull Site: Level 0 Post Pull Instructions Given: YES Post Pull Pulses Present: yes Dressing Applied: gauze and tegaderm Bedrest begins @ 1320 Comments:  Removed by Verta Ellen, RN, [facility]  There is an approximately 1 in skin tear above site area noted to be there before sheath pull.

## 2020-02-07 ENCOUNTER — Other Ambulatory Visit (HOSPITAL_COMMUNITY): Payer: Medicare Other

## 2020-02-21 ENCOUNTER — Other Ambulatory Visit (HOSPITAL_COMMUNITY): Payer: Self-pay | Admitting: *Deleted

## 2020-02-21 DIAGNOSIS — I480 Paroxysmal atrial fibrillation: Secondary | ICD-10-CM

## 2020-02-21 DIAGNOSIS — I2510 Atherosclerotic heart disease of native coronary artery without angina pectoris: Secondary | ICD-10-CM

## 2020-02-21 DIAGNOSIS — I10 Essential (primary) hypertension: Secondary | ICD-10-CM

## 2020-02-21 MED ORDER — CLOPIDOGREL BISULFATE 75 MG PO TABS: 75.0000 mg | ORAL_TABLET | Freq: Every day | ORAL | 3 refills | Status: DC

## 2020-02-21 MED ORDER — TORSEMIDE 20 MG PO TABS: ORAL_TABLET | ORAL | 3 refills | Status: DC

## 2020-02-21 MED ORDER — APIXABAN 5 MG PO TABS: 5.0000 mg | ORAL_TABLET | Freq: Two times a day (BID) | ORAL | 3 refills | Status: DC

## 2020-02-21 MED ORDER — PANTOPRAZOLE SODIUM 40 MG PO TBEC: 40.0000 mg | DELAYED_RELEASE_TABLET | Freq: Every day | ORAL | Status: DC

## 2020-02-21 MED ORDER — SPIRONOLACTONE 25 MG PO TABS: 25.0000 mg | ORAL_TABLET | Freq: Every day | ORAL | 3 refills | Status: DC

## 2020-02-21 MED ORDER — ATORVASTATIN CALCIUM 40 MG PO TABS: ORAL_TABLET | ORAL | 3 refills | Status: DC

## 2020-02-21 MED ORDER — BISOPROLOL FUMARATE 5 MG PO TABS: 5.0000 mg | ORAL_TABLET | Freq: Every day | ORAL | 5 refills | Status: DC

## 2020-02-21 MED ORDER — AMIODARONE HCL 100 MG PO TABS: 100.0000 mg | ORAL_TABLET | Freq: Every day | ORAL | 3 refills | Status: DC

## 2020-02-21 MED ORDER — AMLODIPINE BESYLATE 10 MG PO TABS: 10.0000 mg | ORAL_TABLET | Freq: Every day | ORAL | 3 refills | Status: DC

## 2020-02-21 MED ORDER — EMPAGLIFLOZIN 10 MG PO TABS: 10.0000 mg | ORAL_TABLET | Freq: Every day | ORAL | 6 refills | Status: DC

## 2020-03-02 ENCOUNTER — Encounter (HOSPITAL_COMMUNITY): Payer: Self-pay

## 2020-03-02 ENCOUNTER — Ambulatory Visit (HOSPITAL_COMMUNITY)
Admission: RE | Admit: 2020-03-02 | Discharge: 2020-03-02 | Disposition: A | Discharge status: Home / Self Care | Payer: Medicare Other | Source: Ambulatory Visit | Attending: Internal Medicine | Admitting: Internal Medicine

## 2020-03-02 ENCOUNTER — Other Ambulatory Visit: Payer: Self-pay

## 2020-03-02 VITALS — BP 142/74 | HR 75 | Wt 213.4 lb

## 2020-03-02 DIAGNOSIS — N1831 Chronic kidney disease, stage 3a: Secondary | ICD-10-CM

## 2020-03-02 DIAGNOSIS — E039 Hypothyroidism, unspecified: Secondary | ICD-10-CM | POA: Insufficient documentation

## 2020-03-02 DIAGNOSIS — I739 Peripheral vascular disease, unspecified: Secondary | ICD-10-CM | POA: Insufficient documentation

## 2020-03-02 DIAGNOSIS — I2510 Atherosclerotic heart disease of native coronary artery without angina pectoris: Secondary | ICD-10-CM | POA: Insufficient documentation

## 2020-03-02 DIAGNOSIS — J449 Chronic obstructive pulmonary disease, unspecified: Secondary | ICD-10-CM | POA: Diagnosis not present

## 2020-03-02 DIAGNOSIS — I509 Heart failure, unspecified: Secondary | ICD-10-CM | POA: Diagnosis not present

## 2020-03-02 DIAGNOSIS — I480 Paroxysmal atrial fibrillation: Secondary | ICD-10-CM | POA: Diagnosis not present

## 2020-03-02 DIAGNOSIS — Z87891 Personal history of nicotine dependence: Secondary | ICD-10-CM | POA: Diagnosis not present

## 2020-03-02 DIAGNOSIS — K7460 Unspecified cirrhosis of liver: Secondary | ICD-10-CM | POA: Diagnosis not present

## 2020-03-02 DIAGNOSIS — Z9981 Dependence on supplemental oxygen: Secondary | ICD-10-CM | POA: Insufficient documentation

## 2020-03-02 DIAGNOSIS — Z7982 Long term (current) use of aspirin: Secondary | ICD-10-CM | POA: Insufficient documentation

## 2020-03-02 DIAGNOSIS — M109 Gout, unspecified: Secondary | ICD-10-CM | POA: Diagnosis not present

## 2020-03-02 DIAGNOSIS — Z7901 Long term (current) use of anticoagulants: Secondary | ICD-10-CM | POA: Diagnosis not present

## 2020-03-02 DIAGNOSIS — G4733 Obstructive sleep apnea (adult) (pediatric): Secondary | ICD-10-CM | POA: Insufficient documentation

## 2020-03-02 DIAGNOSIS — I5042 Chronic combined systolic (congestive) and diastolic (congestive) heart failure: Secondary | ICD-10-CM | POA: Diagnosis not present

## 2020-03-02 DIAGNOSIS — E1151 Type 2 diabetes mellitus with diabetic peripheral angiopathy without gangrene: Secondary | ICD-10-CM | POA: Insufficient documentation

## 2020-03-02 DIAGNOSIS — I130 Hypertensive heart and chronic kidney disease with heart failure and stage 1 through stage 4 chronic kidney disease, or unspecified chronic kidney disease: Secondary | ICD-10-CM | POA: Diagnosis not present

## 2020-03-02 DIAGNOSIS — N183 Chronic kidney disease, stage 3 unspecified: Secondary | ICD-10-CM | POA: Insufficient documentation

## 2020-03-02 DIAGNOSIS — E1122 Type 2 diabetes mellitus with diabetic chronic kidney disease: Secondary | ICD-10-CM | POA: Diagnosis not present

## 2020-03-02 DIAGNOSIS — Z794 Long term (current) use of insulin: Secondary | ICD-10-CM | POA: Diagnosis not present

## 2020-03-02 DIAGNOSIS — R0683 Snoring: Secondary | ICD-10-CM | POA: Diagnosis not present

## 2020-03-02 DIAGNOSIS — M79646 Pain in unspecified finger(s): Secondary | ICD-10-CM | POA: Insufficient documentation

## 2020-03-02 DIAGNOSIS — Z720 Tobacco use: Secondary | ICD-10-CM

## 2020-03-02 DIAGNOSIS — Z7902 Long term (current) use of antithrombotics/antiplatelets: Secondary | ICD-10-CM | POA: Insufficient documentation

## 2020-03-02 DIAGNOSIS — Z79899 Other long term (current) drug therapy: Secondary | ICD-10-CM | POA: Insufficient documentation

## 2020-03-02 DIAGNOSIS — M1000 Idiopathic gout, unspecified site: Secondary | ICD-10-CM

## 2020-03-02 LAB — URIC ACID: Uric Acid, Serum: 9.3 mg/dL — ABNORMAL HIGH (ref 2.5–7.1)

## 2020-03-02 LAB — BASIC METABOLIC PANEL
Anion gap: 18 — ABNORMAL HIGH (ref 5–15)
BUN: 41 mg/dL — ABNORMAL HIGH (ref 6–20)
CO2: 21 mmol/L — ABNORMAL LOW (ref 22–32)
Calcium: 8.9 mg/dL (ref 8.9–10.3)
Chloride: 95 mmol/L — ABNORMAL LOW (ref 98–111)
Creatinine, Ser: 1.81 mg/dL — ABNORMAL HIGH (ref 0.44–1.00)
GFR calc Af Amer: 35 mL/min — ABNORMAL LOW (ref >60)
GFR calc non Af Amer: 30 mL/min — ABNORMAL LOW (ref >60)
Glucose, Bld: 292 mg/dL — ABNORMAL HIGH (ref 70–99)
Potassium: 4 mmol/L (ref 3.5–5.1)
Sodium: 134 mmol/L — ABNORMAL LOW (ref 135–145)

## 2020-03-02 MED ORDER — COLCHICINE 0.6 MG PO TABS: 0.6000 mg | ORAL_TABLET | ORAL | 3 refills | Status: DC

## 2020-03-02 MED ORDER — PREDNISONE 10 MG PO TABS: 40.0000 mg | ORAL_TABLET | Freq: Every day | ORAL | 0 refills | Status: AC

## 2020-03-02 NOTE — Progress Notes (Signed)
Patient ID: [name], female   DOB: 08/11/1961, 59 y.o.   MRN: [id number]    Advanced Heart Failure Clinic Note   PCP: Dr Roxy Manns HF Cardiology: Dr. Aundra Dubin PV: Dr. Gwenlyn Found  [name] is a 59 y.o. with history of PAD, carotid stenosis s/p left CEA, relatively mild CAD, chronic systolic CHF, paroxysmal atrial fibrillation and prior substance abuse presents for CHF clinic followup.  She was admitted in 1/17 with acute hypoxemic respiratory failure in the setting of atrial fibrillation/RVR and volume overload.  She was initially intubated.  She converted back to NSR with amiodarone gtt.  She was treated with IV Lasix, steroids, bronchodilators.  She developed AKI and losartan was stopped.    She was admitted in 3/17 with symptomatic bradycardia.  She was supposed to stop diltiazem after this admission but continued to take it.  She presented later in 3/17, again with symptomatic bradycardia (junctional bradycardia), hypotension, and AKI.  Diltiazem and bisoprolol were stopped.  HR recovered and she has had no problems since.  ACEI was stopped with AKI.   In 5/18, she had peripheral angiography with atherectomy of right SFA.  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 7/18 ABIs showed 0.65 on right, 0.31 on left.  In 8/20, she had peripheral angiography showing totally occluded left SFA.  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 Donnetta Hutching.  ABIs in 2/21 were 0.44 on right, 0.99 on left.   Most recent echo in 1/21 showed EF 50%, mild LVH, normal RV, PASP 60 mmHg.   Today she returns for HF follow up. Last visit she was switched to torsemide. Overall feeling much better. Today she is complaining of 3rd finger pain.  Denies SOB/PND/Orthopnea. Walking the house with cane.  Appetite ok. No fever or chills. She has not been weighing at home. Taking all medications. Smoking 1/2 PPD.    Cardiomems: Reading 16 mm hg  Labs (1/17): K 5.2,  creatinine 1.4, AST 43, ALT normal Labs (2/17): K 4.2, creatinine 1.3, BNP 607, TSH normal Labs (3/17): K 4.2, creatinine 1.45, AST/ALT normal, HCT 28.7 Labs (4/17): K 4.4, creatinine 1.61 Labs (08/31/2016): K 4.5 Creatinine 1.43  Labs (11/17): K 4.2, creatinine 1.4 Labs (9/18): K 4.1, creatinine 1.1, TSH normal Labs 04/13/2018: K 5 Creatinine 1.75 Labs (2/21): K 4.1, creatinine 1.58 Labs (01/28/20): K 4.1 Creatinine 1.4   PMH: 1. Carotid stenosis: Known occluded right carotid.  Left CEA in 4/16.  2. CAD: LHC in 12/15 with 80% stenosis in small OM1, nonobstructive disease in other territories.  3. Chronic systolic CHF: Nonischemic cardiomyopathy (?due to ETOH or prior drug abuse).  - Echo (1/17) with EF 45%, mild LV hypertrophy, moderate diastolic dysfunction, inferolateral severe hypokinesis, mildly decreased RV systolic function.  - Echo (1/18): EF 40-45%, mild LVH, normal RV size and systolic function.  - Echo (1/21): EF 50%, mild LVH, normal RV, PASP 60 mmHg.  4. Atrial fibrillation: Paroxysmal.   5. Type II diabetes 6. CKD: Stage III. 7. COPD: Quit smoking 1/17. On home oxygen.  8. 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  No longer drinks.  9. Hypothyroidism 10. PAD: Atherectomy SFA in 2014 (Dr Gwenlyn Found).  Peripheral arterial dopplers (2/17) with focal 75-99% proximal right SFA stenosis, occluded mid-distal right SFA, chronic occlusion of all runoff arteries on right. - In 5/18, she had peripheral angiography with atherectomy of right SFA.   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  - 7/18 ABIs showed 0.65 on right, 0.31 on left.   - Peripheral angiography 8/20: Totally occluded left SFA.  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 Donnetta Hutching.   - ABIs (2/21): 0.44 R, 0.99 L.  11. Anemia 12. Prior cocaine abuse.  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  14. OSA: Has not been able to  tolerate CPAP.   SH: Lives with sister.  Prior cocaine abuse.  Prior ETOH abuse.  Quit smoking in 1/17, restarted, and quit again in 4/19.  FH: CAD  Review of systems complete and found to be negative unless listed in HPI.    Current Outpatient Medications  Medication Sig Dispense Refill  . acetaminophen (TYLENOL) 500 MG tablet Take 2 tablets (1,000 mg total) by mouth every 6 (six) hours as needed for mild pain. 30 tablet 0  . albuterol (VENTOLIN HFA) 108 (90 Base) MCG/ACT inhaler Inhale 2 puffs into the lungs every 6 (six) hours as needed for wheezing or shortness of breath. 18 g 0  . amiodarone (PACERONE) 100 MG tablet Take 1 tablet (100 mg total) by mouth daily. 30 tablet 3  . amLODipine (NORVASC) 10 MG tablet Take 1 tablet (10 mg total) by mouth daily. 30 tablet 3  . apixaban (ELIQUIS) 5 MG TABS tablet Take 1 tablet (5 mg total) by mouth 2 (two) times daily. 60 tablet 3  . Aspirin-Caffeine (BC FAST PAIN RELIEF PO) Take 1 packet by mouth daily as needed (pain).    [name] atorvastatin (LIPITOR) 40 MG tablet TAKE 1 TABLET BY MOUTH ONCE EVERY EVENING 30 tablet 3  . bisoprolol (ZEBETA) 5 MG tablet Take 1 tablet (5 mg total) by mouth daily. 30 tablet 5  . budesonide-formoterol (SYMBICORT) 160-4.5 MCG/ACT inhaler Inhale 2 puffs into the lungs 2 (two) times daily. (Patient taking differently: Inhale 2 puffs into the lungs 2 (two) times daily as needed (shortness of breath). ) 1 Inhaler 2  . cholecalciferol (VITAMIN D) 1000 units tablet Take 1 tablet (1,000 Units total) by mouth daily.    . clopidogrel (PLAVIX) 75 MG tablet Take 1 tablet (75 mg total) by mouth daily. 30 tablet 3  . colchicine 0.6 MG tablet Take 0.6-1.2 mg by mouth See admin instructions. Take 1.2mg  by mouth initially at onset of gout flare up, then take 0.6mg  in one hour, then stop.    [name] docusate sodium (COLACE) 100 MG capsule Take 1 capsule (100 mg total) by mouth daily. 10 capsule 0  . empagliflozin (JARDIANCE) 10 MG TABS tablet Take  10 mg by mouth daily before breakfast. 30 tablet 6  . folic acid (FOLVITE) 1 MG tablet Take 1 tablet (1 mg total) by mouth daily. 30 tablet 0  . Insulin Glargine (LANTUS SOLOSTAR) 100 UNIT/ML Solostar Pen Inject 40 Units into the skin at bedtime.    . Insulin Lispro (HUMALOG KWIKPEN) 200 UNIT/ML SOPN Inject 8 Units into the skin 3 (three) times daily with meals. (Patient taking differently: Inject 8-15 Units into the skin 3 (three) times daily with meals. )    . levothyroxine (SYNTHROID, LEVOTHROID) 50 MCG tablet Take 1 tablet (50 mcg total) by mouth daily. 30 tablet 0  . Multiple Vitamin (MULTIVITAMIN WITH MINERALS) TABS tablet Take 1 tablet by mouth daily. 30 tablet 0  . nortriptyline (PAMELOR) 50 MG capsule Take 50 mg by mouth at bedtime.    [name]  pantoprazole (PROTONIX) 40 MG tablet Take 1 tablet (40 mg total) by mouth daily.    . potassium chloride SA (KLOR-CON) 20 MEQ tablet Take 1 tablet (20 mEq total) by mouth daily. 90 tablet 3  . senna-docusate (SENOKOT-S) 8.6-50 MG tablet Take 2 tablets by mouth at bedtime as needed for mild constipation.    [name] spironolactone (ALDACTONE) 25 MG tablet Take 1 tablet (25 mg total) by mouth daily. 90 tablet 3  . tiotropium (SPIRIVA HANDIHALER) 18 MCG inhalation capsule Place 1 capsule (18 mcg total) into inhaler and inhale every morning. 30 capsule 2  . torsemide (DEMADEX) 20 MG tablet Take 3 tablets (60 mg total) by mouth every morning AND 2 tablets (40 mg total) every evening. 150 tablet 3   No current facility-administered medications for this encounter.   Vitals:   03/02/20 1122  BP: (!) 142/74  Pulse: 75  SpO2: 96%  Weight: 96.8 kg (213 lb 6.4 oz)     Wt Readings from Last 3 Encounters:  03/02/20 96.8 kg (213 lb 6.4 oz)  02/05/20 100.7 kg (222 lb)  01/28/20 101 kg (222 lb 9.6 oz)    Physical Exam  General:  No resp difficulty. Arrived in a wheel chair.  HEENT: normal Neck: supple. no JVD. Carotids 2+ bilat; no bruits. No lymphadenopathy or  thryomegaly appreciated. Cor: PMI nondisplaced. Regular rate & rhythm. No rubs, gallops or murmurs. Lungs: clear Abdomen: soft, nontender, nondistended. No hepatosplenomegaly. No bruits or masses. Good bowel sounds. Extremities: no cyanosis, clubbing, rash, edema. L third finger red and tender. R and LLE mild erythema.  Neuro: alert & orientedx3, cranial nerves grossly intact. moves all 4 extremities w/o difficulty. Affect pleasant  Assessment/Plan: 1. Chronic diastolic CHF: EF 90% on last echo in 1/21. Has had presumed nonischemic given LHC in 12/15 showing only 80% stenosis in small OM1.  EF as low as 40-45% in the past.   Has Cardiomems: Reading 16 m HG. Will low goal to 21.  -NYHA II. Volume status stable. Continue torsemide 60 mg/40mg   - Continue spironolactone 25 mg daily.  - Check BMET 2. Atrial fibrillation: Paroxysmal.  - Continue amiodarone 100 mg daily -   He is on levothyroxine, followed by PCP.  Needs regular eye exams. - Continue Eliquis 5 mg bid.   3. PAD: Claudication bilaterally.  Not candidate for cilostazol with CHF.  She has had a left fem-pop bypass in 8/20.  ABIs in 2/21 were very abnormal on right.  She also has an ulceration on her right lower leg.  - She is still on Plavix along with Eliquis, will ask Dr. Gwenlyn Found how long this needs to continue.  - Followup with Drs Gwenlyn Found and Early for PAD.  - Continue atorvastatin.  4. COPD:  Discussed smoking cessation.  5. H/o cirrhosis: Likely from ETOH, now abstinent.  6. OSA: Not able to tolerate CPAP.  - Refer to Dr. Radford Pax to see if there is a form of CPAP that she can tolerate.  7. CKD stage III: Check bmet.  8. HTN: Stable. Continue  bisoprolol to 5 mg daily.  9.Type 2 diabetes: With vascular disease, CHF, and diabetes, she would be a good candidate for SGLT2-inhibitor.  - Continue Jardiance 10 mg daily.  10. CAD: LHC in 12/15 with 80% stenosis in small OM1, nonobstructive disease in other territories. - No chest pain.k    - Continue statin + SGLT2  11. Aucte Gout Flare - Check uric acid.  -Refill colchicine.  - Give  40 mg prednisone daily x 3 days.    Follow up in 3 month.  Darrick Grinder, NP  03/02/2020

## 2020-03-02 NOTE — Patient Instructions (Addendum)
START Prednisone 40 mg daily for 3 days  We have also refilled your colchicine   Labs today We will only contact you if something comes back abnormal or we need to make some changes. Otherwise no news is good news!  You have been referred to Dr Radford Pax for further evaluation of sleep apnea [place] [phone] -they will be in touch with you for an appointment   Your physician recommends that you schedule a follow-up appointment in: 3 months  in the Advanced Practitioners (PA/NP) Clinic   Do the following things EVERYDAY: 1) Weigh yourself in the morning before breakfast. Write it down and keep it in a log. 2) Take your medicines as prescribed 3) Eat low salt foods--Limit salt (sodium) to 2000 mg per day.  4) Stay as active as you can everyday 5) Limit all fluids for the day to less than 2 liters  At the [facility], you and your health needs are our priority. As part of our continuing mission to provide you with exceptional heart care, we have created designated Provider Care Teams. These Care Teams include your primary Cardiologist (physician) and Advanced Practice Providers (APPs- Physician Assistants and Nurse Practitioners) who all work together to provide you with the care you need, when you need it.   You may see any of the following providers on your designated Care Team at your next follow up: [name] Dr Glori Bickers . Dr Loralie Champagne . Darrick Grinder, NP . Lyda Jester, PA . Audry Riles, PharmD   Please be sure to bring in all your medications bottles to every appointment.

## 2020-03-03 ENCOUNTER — Ambulatory Visit (HOSPITAL_COMMUNITY)
Admission: EM | Admit: 2020-03-03 | Discharge: 2020-03-03 | Disposition: A | Discharge status: Home / Self Care | Payer: Medicare Other | Attending: Urgent Care | Admitting: Urgent Care

## 2020-03-03 ENCOUNTER — Ambulatory Visit (INDEPENDENT_AMBULATORY_CARE_PROVIDER_SITE_OTHER): Payer: Medicare Other

## 2020-03-03 ENCOUNTER — Other Ambulatory Visit: Payer: Self-pay

## 2020-03-03 ENCOUNTER — Encounter (HOSPITAL_COMMUNITY): Payer: Self-pay

## 2020-03-03 ENCOUNTER — Telehealth (HOSPITAL_COMMUNITY): Payer: Self-pay | Admitting: Cardiology

## 2020-03-03 DIAGNOSIS — S52134A Nondisplaced fracture of neck of right radius, initial encounter for closed fracture: Secondary | ICD-10-CM

## 2020-03-03 DIAGNOSIS — S80211A Abrasion, right knee, initial encounter: Secondary | ICD-10-CM

## 2020-03-03 DIAGNOSIS — M1A9XX1 Chronic gout, unspecified, with tophus (tophi): Secondary | ICD-10-CM

## 2020-03-03 DIAGNOSIS — W19XXXA Unspecified fall, initial encounter: Secondary | ICD-10-CM

## 2020-03-03 DIAGNOSIS — M79641 Pain in right hand: Secondary | ICD-10-CM

## 2020-03-03 DIAGNOSIS — M79631 Pain in right forearm: Secondary | ICD-10-CM

## 2020-03-03 DIAGNOSIS — S5011XA Contusion of right forearm, initial encounter: Secondary | ICD-10-CM

## 2020-03-03 MED ORDER — TORSEMIDE 20 MG PO TABS: 40.0000 mg | ORAL_TABLET | Freq: Two times a day (BID) | ORAL | 3 refills | Status: DC

## 2020-03-03 MED ORDER — ACETAMINOPHEN 325 MG PO TABS
325.0000 mg | ORAL_TABLET | Freq: Once | ORAL | Status: AC
  Administered 2020-03-03: 17:00:00 325 mg via ORAL

## 2020-03-03 MED ORDER — ACETAMINOPHEN 325 MG PO TABS
ORAL_TABLET | ORAL | Status: AC
  Filled 2020-03-03: qty 1

## 2020-03-03 NOTE — ED Provider Notes (Signed)
[place]   MRN: [id number] DOB: Mar 19, 1961  Subjective:   [name] is a 59 y.o. female presenting for suffering an accidental fall today.  Patient states that she tripped on a carpet and landed on her right side making impact with the right knee and forearm.  She has since had mild to moderate pain that is constant and worse with using her forearm.  The right knee pain is mild in nature.  She has a lot of swelling around her forearm with bruising.  She is on aspirin, Eliquis and clopidogrel.  Patient has a history of narcotic abuse and respiratory failure.  She is not able to take these medications as such.  No current facility-administered medications for this encounter.  Current Outpatient Medications:  .  acetaminophen (TYLENOL) 500 MG tablet, Take 2 tablets (1,000 mg total) by mouth every 6 (six) hours as needed for mild pain., Disp: 30 tablet, Rfl: 0 .  albuterol (VENTOLIN HFA) 108 (90 Base) MCG/ACT inhaler, Inhale 2 puffs into the lungs every 6 (six) hours as needed for wheezing or shortness of breath., Disp: 18 g, Rfl: 0 .  amiodarone (PACERONE) 100 MG tablet, Take 1 tablet (100 mg total) by mouth daily., Disp: 30 tablet, Rfl: 3 .  amLODipine (NORVASC) 10 MG tablet, Take 1 tablet (10 mg total) by mouth daily., Disp: 30 tablet, Rfl: 3 .  apixaban (ELIQUIS) 5 MG TABS tablet, Take 1 tablet (5 mg total) by mouth 2 (two) times daily., Disp: 60 tablet, Rfl: 3 .  Aspirin-Caffeine (BC FAST PAIN RELIEF PO), Take 1 packet by mouth daily as needed (pain)., Disp: , Rfl:  .  atorvastatin (LIPITOR) 40 MG tablet, TAKE 1 TABLET BY MOUTH ONCE EVERY EVENING, Disp: 30 tablet, Rfl: 3 .  bisoprolol (ZEBETA) 5 MG tablet, Take 1 tablet (5 mg total) by mouth daily., Disp: 30 tablet, Rfl: 5 .  budesonide-formoterol (SYMBICORT) 160-4.5 MCG/ACT inhaler, Inhale 2 puffs into the lungs 2 (two) times daily. (Patient taking differently: Inhale 2 puffs into the lungs 2 (two) times daily as needed  (shortness of breath). ), Disp: 1 Inhaler, Rfl: 2 .  cholecalciferol (VITAMIN D) 1000 units tablet, Take 1 tablet (1,000 Units total) by mouth daily., Disp:  , Rfl:  .  clopidogrel (PLAVIX) 75 MG tablet, Take 1 tablet (75 mg total) by mouth daily., Disp: 30 tablet, Rfl: 3 .  colchicine 0.6 MG tablet, Take 1-2 tablets (0.6-1.2 mg total) by mouth See admin instructions. Take 1.2mg  by mouth initially at onset of gout flare up, then take 0.6mg  in one hour, then stop., Disp: 30 tablet, Rfl: 3 .  docusate sodium (COLACE) 100 MG capsule, Take 1 capsule (100 mg total) by mouth daily., Disp: 10 capsule, Rfl: 0 .  empagliflozin (JARDIANCE) 10 MG TABS tablet, Take 10 mg by mouth daily before breakfast., Disp: 30 tablet, Rfl: 6 .  folic acid (FOLVITE) 1 MG tablet, Take 1 tablet (1 mg total) by mouth daily., Disp: 30 tablet, Rfl: 0 .  Insulin Glargine (LANTUS SOLOSTAR) 100 UNIT/ML Solostar Pen, Inject 40 Units into the skin at bedtime., Disp: , Rfl:  .  Insulin Lispro (HUMALOG KWIKPEN) 200 UNIT/ML SOPN, Inject 8 Units into the skin 3 (three) times daily with meals. (Patient taking differently: Inject 8-15 Units into the skin 3 (three) times daily with meals. ), Disp: , Rfl:  .  levothyroxine (SYNTHROID, LEVOTHROID) 50 MCG tablet, Take 1 tablet (50 mcg total) by mouth daily., Disp: 30 tablet, Rfl: 0 .  Multiple Vitamin (MULTIVITAMIN WITH MINERALS) TABS tablet, Take 1 tablet by mouth daily., Disp: 30 tablet, Rfl: 0 .  nortriptyline (PAMELOR) 50 MG capsule, Take 50 mg by mouth at bedtime., Disp: , Rfl:  .  pantoprazole (PROTONIX) 40 MG tablet, Take 1 tablet (40 mg total) by mouth daily., Disp:  , Rfl:  .  potassium chloride SA (KLOR-CON) 20 MEQ tablet, Take 1 tablet (20 mEq total) by mouth daily., Disp: 90 tablet, Rfl: 3 .  predniSONE (DELTASONE) 10 MG tablet, Take 4 tablets (40 mg total) by mouth daily for 3 days., Disp: 12 tablet, Rfl: 0 .  senna-docusate (SENOKOT-S) 8.6-50 MG tablet, Take 2 tablets by mouth at  bedtime as needed for mild constipation., Disp: , Rfl:  .  spironolactone (ALDACTONE) 25 MG tablet, Take 1 tablet (25 mg total) by mouth daily., Disp: 90 tablet, Rfl: 3 .  tiotropium (SPIRIVA HANDIHALER) 18 MCG inhalation capsule, Place 1 capsule (18 mcg total) into inhaler and inhale every morning., Disp: 30 capsule, Rfl: 2 .  torsemide (DEMADEX) 20 MG tablet, Take 2 tablets (40 mg total) by mouth 2 (two) times daily., Disp: 150 tablet, Rfl: 3   Allergies  Allergen Reactions  . Gabapentin Nausea And Vomiting and Other (See Comments)    POSSIBLE SHAKING? TAKING MED CURRENTLY  . Lyrica [Pregabalin] Other (See Comments)    Shaking Pt still taking lyrica--"probably will stop taking"     Past Medical History:  Diagnosis Date  . Alcohol abuse   . Alcoholic cirrhosis ([place])   . Anemia   . Anxiety   . Arthritis    "knees" (11/26/2018)  . B12 deficiency   . CAD (coronary artery disease)    a. 11/10/2014 Cath: LM nl, LAD min irregs, D1 30 ost, D2 50d, LCX 56m, OM1 80 p/m (1.5 mm vessel), OM2 64m, RCA nondom 32m-->med rx.. Demand ischemia in the setting of rapid a-fib.  . Cardiomyopathy ([place])   . Carotid artery disease ([place])    a. 01/2015 Carotid Angio: RICA 983, LICA 38S; b. 03/538 s/p L CEA; c. 05/2019 Carotid U/S: RICA 100. RECA >50. LICA 7-67%.  . Cellulitis    lower extremities  . Chronic combined systolic and diastolic CHF (congestive heart failure) ([place])    a. 10/2014 Echo: EF 40-45%; b. 10/2018 Echo: EF 45-50%, gr2 DD; c. 11/2019 Echo: EF 50%, mild LVH, gr2 DD (restrictive), antlat HK, Nl RV fxn. Mild BAE. RVSP 59.60mmHg.  . CKD (chronic kidney disease), stage III   . Cocaine abuse ([place])   . COPD (chronic obstructive pulmonary disease) ([place])   . Critical lower limb ischemia   . Depression   . Diabetic peripheral neuropathy ([place])   . Dyspnea   . Elevated troponin    a. Chronic elevation.  [name] GERD (gastroesophageal reflux disease)   . Hyperlipemia   . Hypertension   . Hypokalemia     . Hypomagnesemia   . Hypothyroidism   . Marijuana abuse   . Narcotic abuse ([place])   . Noncompliance   . NSVT (nonsustained ventricular tachycardia) ([place])   . Obesity   . PAF (paroxysmal atrial fibrillation) ([place])   . Paroxysmal atrial tachycardia ([place])   . Peripheral arterial disease ([place])    a. 01/2015 Angio/PTA: RSFA 99 (atherectomy/pta) - 1 vessel runoff via diff dzs peroneal; b. 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  . Pneumonia    "once or twice" (11/26/2018)  . Poorly controlled type 2 diabetes  mellitus ([name])   . Renal insufficiency    a. 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  [name] Sleep apnea    "couldn't handle wearing the mask" (11/26/2018)  . Symptomatic bradycardia    a. Avoid AV blocking agent per EP. Prev req temp wire in 2017.  . Tobacco abuse      Past Surgical History:  Procedure Laterality Date  . ABDOMINAL AORTOGRAM N/A 06/26/2019   Procedure: ABDOMINAL AORTOGRAM;  Surgeon: Wellington Hampshire, MD;  Location: [facility];  Service: Cardiovascular;  Laterality: N/A;  . AMPUTATION Right 06/14/2017   Procedure: Right foot transmetatarsal amputation;  Surgeon: Newt Minion, MD;  Location: [facility];  Service: Orthopedics;  Laterality: Right;  . AMPUTATION Left 06/28/2019   Procedure: AMPUTATION LEFT FIFTH TOE;  Surgeon: Rosetta Posner, MD;  Location: [facility];  Service: Vascular;  Laterality: Left;  . AMPUTATION TOE Right 04/28/2017   Procedure: AMPUTATION OF RIGHT SECOND RAY;  Surgeon: Newt Minion, MD;  Location: [facility];  Service: Orthopedics;  Laterality: Right;  . CARDIAC CATHETERIZATION    . CARDIAC CATHETERIZATION N/A 01/12/2016   Procedure: Temporary Wire;  Surgeon: Minus Breeding, MD;  Location: [facility];  Service: Cardiovascular;  Laterality: N/A;  . CARDIOVERSION  ~ 02/2013   "twice"   . CAROTID ANGIOGRAM N/A 01/15/2015   Procedure: CAROTID ANGIOGRAM;  Surgeon: [name], MD;  Location[facility] CATH LAB;  Service: Cardiovascular;  Laterality: N/A;   . DILATION AND CURETTAGE OF UTERUS  1988  . ENDARTERECTOMY Left 02/19/2015   Procedure: LEFT CAROTID ENDARTERECTOMY ;  Surgeon: Serafina Mitchell, MD;  Location: [facility];  Service: Vascular;  Laterality: Left;  . FEMORAL-TIBIAL BYPASS GRAFT Left 06/28/2019   Procedure: BYPASS GRAFT LEFT LEG FEMORAL TO ANTERIOR TIBIAL ARTERY using LEFT GREATER SAPHENOUS VEIN;  Surgeon: Rosetta Posner, MD;  Location: [facility];  Service: Vascular;  Laterality: Left;  . LEFT HEART CATHETERIZATION WITH CORONARY ANGIOGRAM N/A 10/31/2014   Procedure: LEFT HEART CATHETERIZATION WITH CORONARY ANGIOGRAM;  Surgeon: Burnell Blanks, MD;  Location[facility] C[facility] LAB;  Service: Cardiovascular;  Laterality: N/A;  . LOWER EXTREMITY ANGIOGRAM N/A 09/10/2013   Procedure: LOWER EXTREMITY ANGIOGRAM;  Surgeon: [name], MD;  Location: [facility] CATH LAB;  Service: Cardiovascular;  Laterality: N/A;  . LOWER EXTREMITY ANGIOGRAM N/A 01/15/2015   Procedure: LOWER EXTREMITY ANGIOGRAM;  Surgeon: [name], MD;  Location: [facility] CATH LAB;  Service: Cardiovascular;  Laterality: N/A;  . LOWER EXTREMITY ANGIOGRAPHY N/A 04/13/2017   Procedure: Lower Extremity Angiography;  Surgeon: [name], MD;  Location: [facility];  Service: Cardiovascular;  Laterality: N/A;  . LOWER EXTREMITY ANGIOGRAPHY Left 06/26/2019   Procedure: LOWER EXTREMITY ANGIOGRAPHY;  Surgeon: Wellington Hampshire, MD;  Location: [facility];  Service: Cardiovascular;  Laterality: Left;  . PERIPHERAL VASCULAR BALLOON ANGIOPLASTY Left 06/26/2019   Procedure: PERIPHERAL VASCULAR BALLOON ANGIOPLASTY;  Surgeon: Wellington Hampshire, MD;  Location: [facility];  Service: Cardiovascular;  Laterality: Left;  unable to cross lt sfa occlusion  . PERIPHERAL VASCULAR INTERVENTION  04/13/2017   Procedure: Peripheral Vascular Intervention;  Surgeon: [name], MD;  Location: [facility];  Service: Cardiovascular;;  . PRESSURE SENSOR/CARDIOMEMS N/A 02/05/2020    Procedure: PRESSURE SENSOR/CARDIOMEMS;  Surgeon: Larey Dresser, MD;  Location: [facility];  Service: Cardiovascular;  Laterality: N/A;  . VEIN HARVEST Left 06/28/2019   Procedure: LEFT LEG GREATER SAPHENOUS VEIN HARVEST;  Surgeon: Rosetta Posner, MD;  Location: [facility] OR;  Service: Vascular;  Laterality: Left;    Family History  Problem Relation Age of Onset  . Hypertension Mother   . Diabetes Mother   . Cancer Mother        breast, ovarian, colon  . Clotting disorder Mother   . Heart disease Mother   . Heart attack Mother   . Breast cancer Mother        in 58's  . Hypertension Father   . Heart disease Father   . Emphysema Sister        smoked    Social History   Tobacco Use  . Smoking status: Current Some Day Smoker    Packs/day: 1.00    Years: 44.00    Pack years: 44.00    Types: E-cigarettes, Cigarettes  . Smokeless tobacco: Former Systems developer    Types: Snuff  Substance Use Topics  . Alcohol use: Yes    Comment: 11/26/2018 "couple drinks/month maybe"  . Drug use: Not Currently    Types: "Crack" cocaine, Marijuana, Oxycodone    ROS   Objective:   Vitals: BP (!) 118/55   Pulse 73   Temp 98.8 F (37.1 C) (Oral)   Resp 16   Ht 5\' 4"  (1.626 m)   Wt 218 lb (98.9 kg)   LMP 11/27/2012   SpO2 98%   BMI 37.42 kg/m   Physical Exam Constitutional:      General: She is not in acute distress.    Appearance: Normal appearance. She is well-developed. She is not ill-appearing, toxic-appearing or diaphoretic.  HENT:     Head: Normocephalic and atraumatic.     Nose: Nose normal.     Mouth/Throat:     Mouth: Mucous membranes are moist.     Pharynx: Oropharynx is clear.  Eyes:     General: No scleral icterus.       Right eye: No discharge.        Left eye: No discharge.     Extraocular Movements: Extraocular movements intact.     Pupils: Pupils are equal, round, and reactive to light.  Cardiovascular:     Rate and Rhythm: Normal rate.  Pulmonary:     Effort:  Pulmonary effort is normal.  Musculoskeletal:       Arms:     Right knee: No swelling, deformity, effusion, erythema, ecchymosis, bony tenderness or crepitus. Normal range of motion. Tenderness (mild over area outlined with small abrasion) present. Normal alignment and normal patellar mobility.       Legs:     Comments: Patient has 5/5 strength for right upper extremity.  She has mild tenderness at the level of the radial neck.  Skin:    General: Skin is warm and dry.  Neurological:     General: No focal deficit present.     Mental Status: She is alert and oriented to person, place, and time.  Psychiatric:        Mood and Affect: Mood normal.        Behavior: Behavior normal.        Thought Content: Thought content normal.        Judgment: Judgment normal.     DG Forearm Right  Result Date: 03/03/2020 CLINICAL DATA:  59 year old female with fall and trauma to the right upper extremity. EXAM: RIGHT FOREARM - 2 VIEW COMPARISON:  Right wrist radiograph dated 07/05/2019. FINDINGS: There is angulated appearance of the radial neck which although may  be chronic is concerning for a fracture. Clinical correlation is recommended. CT may provide better evaluation if there is high clinical concern for a radial neck fracture. There is no dislocation. The bones are osteopenic. There is widening of the scapholunate distance new since the prior radiograph. There is soft tissue swelling of the dorsum of the forearm. Vascular calcifications noted. IMPRESSION: 1. Angulated appearance of the radial neck concerning for a fracture. Clinical correlation is recommended. 2. Widening of the scapholunate distance. 3. Osteopenia. Electronically Signed   By: Anner Crete M.D.   On: 03/03/2020 17:28    Assessment and Plan :   PDMP not reviewed this encounter.  1. Right forearm pain   2. Fall, initial encounter   3. Gout tophi   4. Bilateral hand pain   5. Closed nondisplaced fracture of neck of right radius,  initial encounter   6. Traumatic hematoma of right forearm, initial encounter   7. Abrasion of right knee, initial encounter     Patient would benefit from having CT to definitively rule out radial neck fracture.  In the meantime, we will manage it with posterior splint.  Schedule Tylenol.  This was given to patient in clinic.  Patient is to contact the orthopedist on-call, Dr. Stann Mainland with [facility].  Use supportive care otherwise. Counseled patient on potential for adverse effects with medications prescribed/recommended today, ER and return-to-clinic precautions discussed, patient verbalized understanding.    Jaynee Eagles, [place] 03/03/20 1814

## 2020-03-03 NOTE — ED Triage Notes (Signed)
Pt states she tripped and fell. Pt had a ground level fall. Pt denies hitting head. Pt fell on her right arm and right knee. Pt c/o 9/10 throbbing pain in right arm. Pt's right arm is swollen and ecchymotic. Pt able to move arm.

## 2020-03-03 NOTE — Discharge Instructions (Addendum)
Please just use Tylenol at a dose of 500mg -650mg  once every 6 hours as needed for your aches, pains. Do not use any nonsteroidal anti-inflammatories (NSAIDs) like ibuprofen, Motrin, naproxen, Aleve, etc. which are all available over-the-counter.  Please wear the splint until you are seen by Dr. Stann Mainland with [facility].

## 2020-03-03 NOTE — Telephone Encounter (Signed)
Pt aware and voiced understanding 

## 2020-03-03 NOTE — Telephone Encounter (Signed)
-----   Message from Conrad [place], NP sent at 03/02/2020  1:15 PM EDT ----- Please call.

## 2020-03-05 IMAGING — CR LEFT FOOT - COMPLETE 3+ VIEW
3 series · 3 of 3 positions shown · non-contrast
Comparison: None.

CLINICAL DATA: Wound on the left little toe.  Left foot pain.

EXAM:
LEFT FOOT - COMPLETE 3+ VIEW

[foot ap]
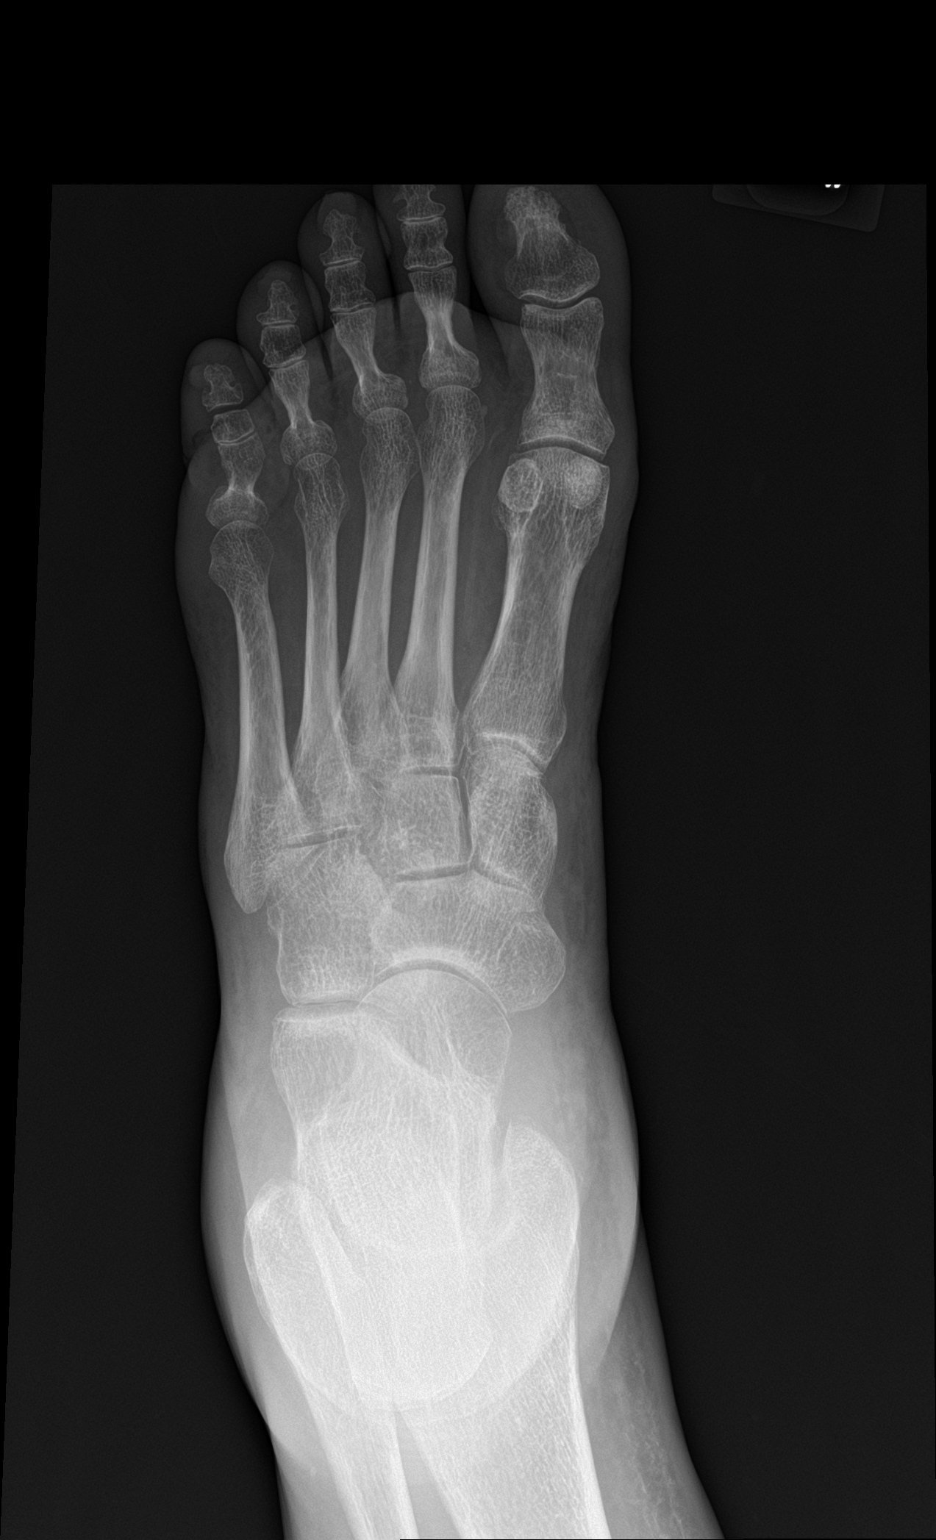

[foot obl]
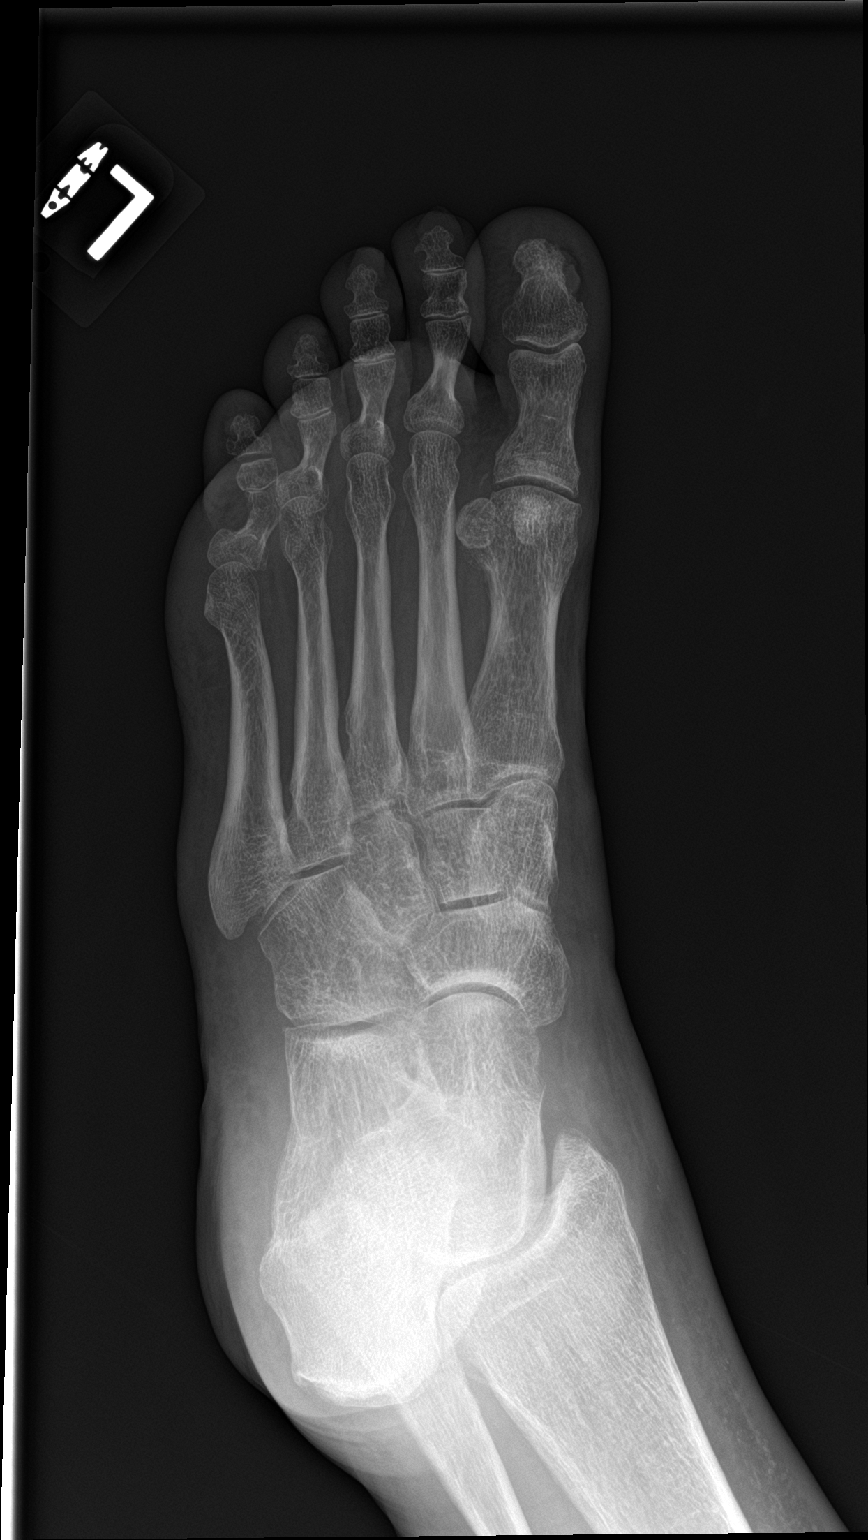

[foot lat]
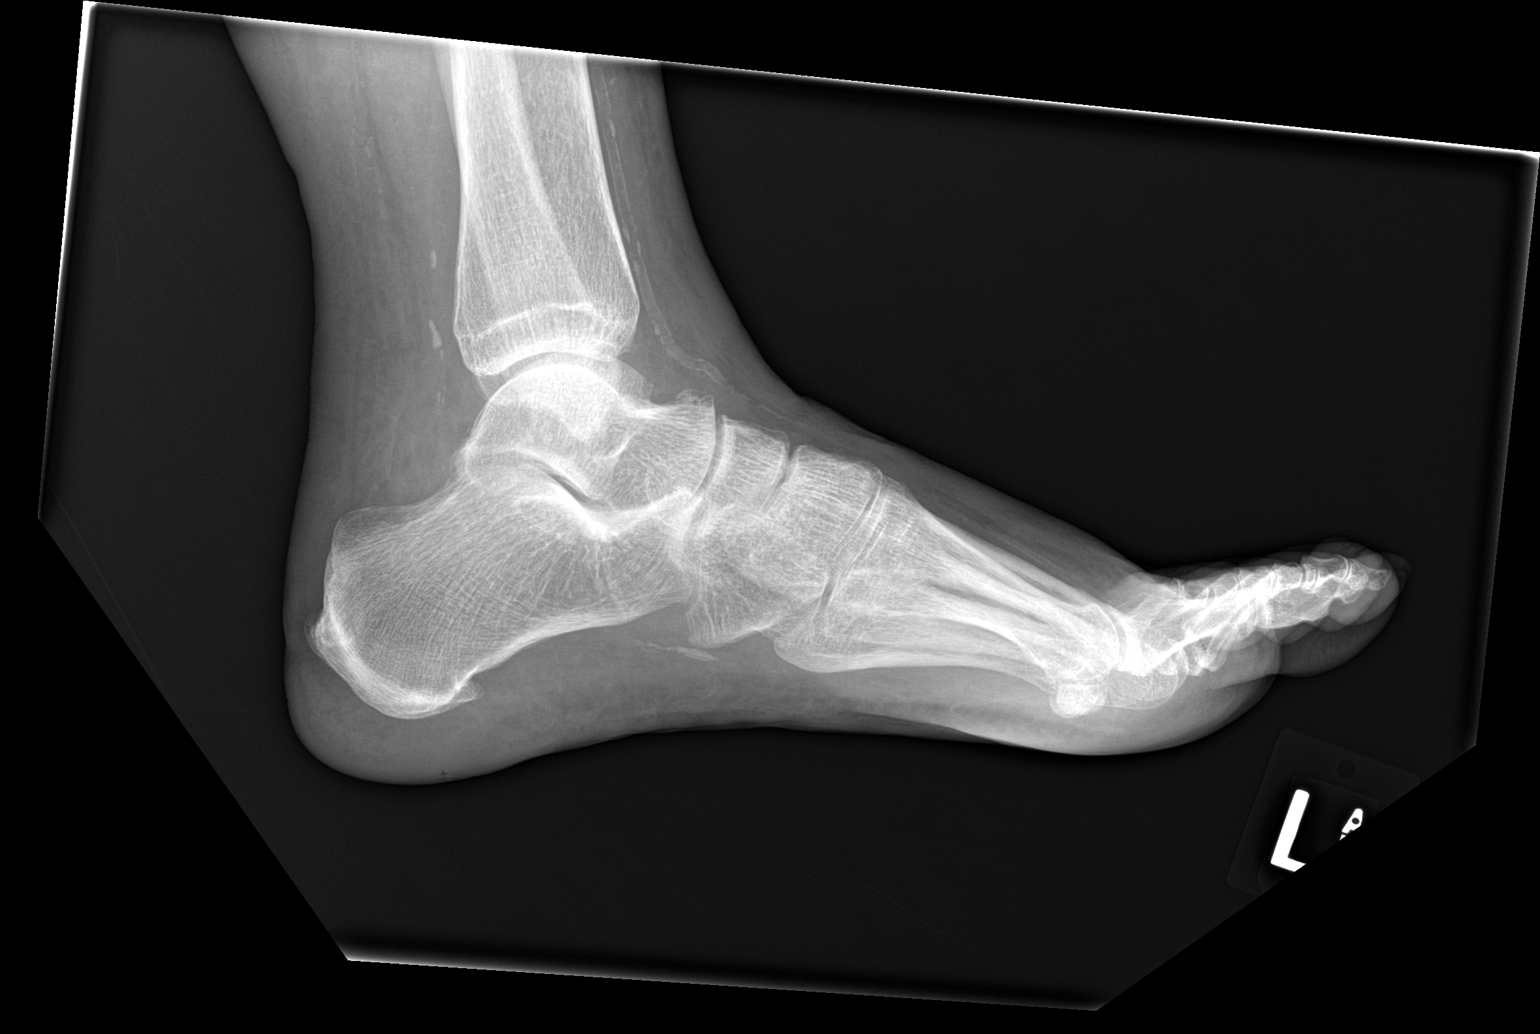

[3 of 3 positions shown; findings below may reference images not displayed]

FINDINGS: Soft tissues of the little toe are swollen and there is some soft
tissue gas. No bony destructive change or periosteal reaction. No
radiopaque foreign body. Atherosclerosis noted. No fracture or
dislocation.
IMPRESSION: Soft tissue gas and swelling in the little toe consistent with
infection. Negative for radiopaque foreign body or evidence of
osteomyelitis.

## 2020-03-05 IMAGING — CT CT OF THE LEFT FOOT WITHOUT CONTRAST
3 of 4 series · 12 of 33 positions shown, 14 images · non-contrast
Comparison: None.

CLINICAL DATA: Wound on the left little toe for 3 weeks.

EXAM:
CT OF THE LEFT FOOT WITHOUT CONTRAST
TECHNIQUE: Multidetector CT imaging of the left foot was performed according to
the standard protocol. Multiplanar CT image reconstructions were
also generated.

[Series 6: lower ext thin st · axial · 0.46mm/px · z∈[-1148,-1032]mm · 4 of 387 slices shown, 5 images]
[im 78/387  soft-tissue]
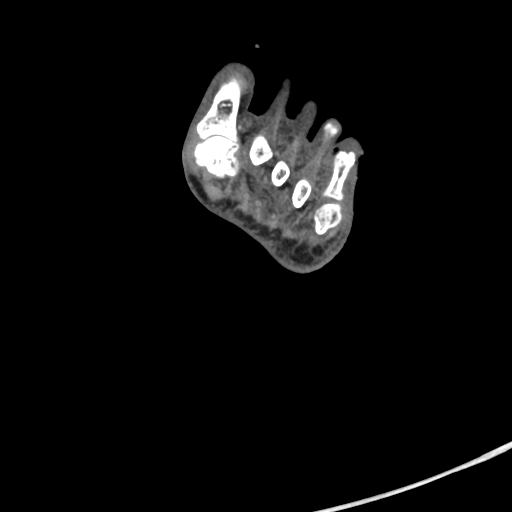
[im 78/387  bone]
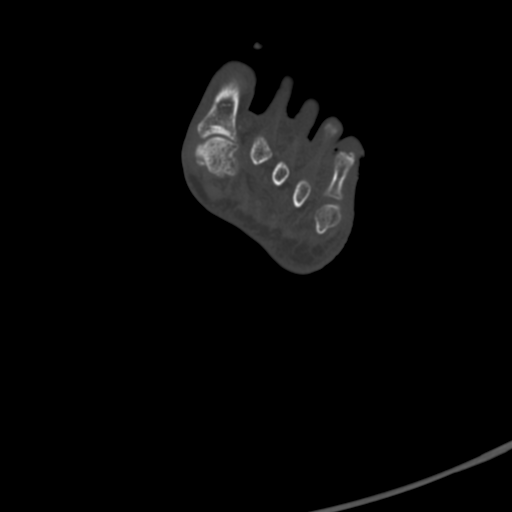
[im 155/387  bone]
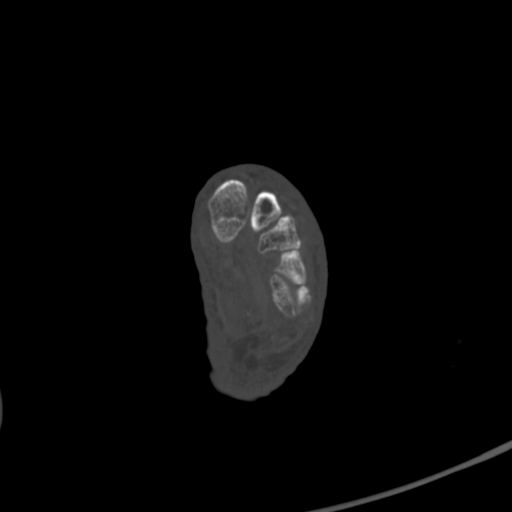
[im 232/387  bone]
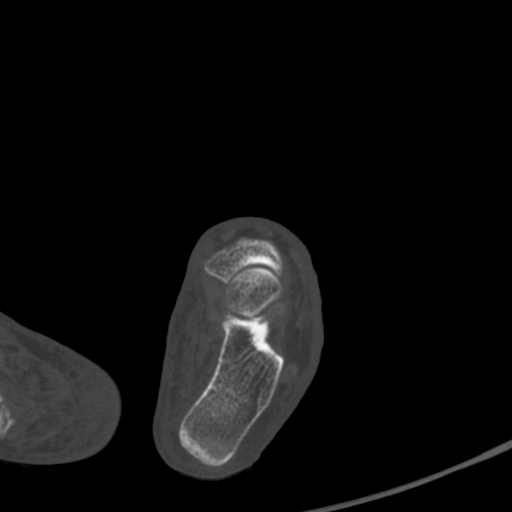
[im 309/387  bone]
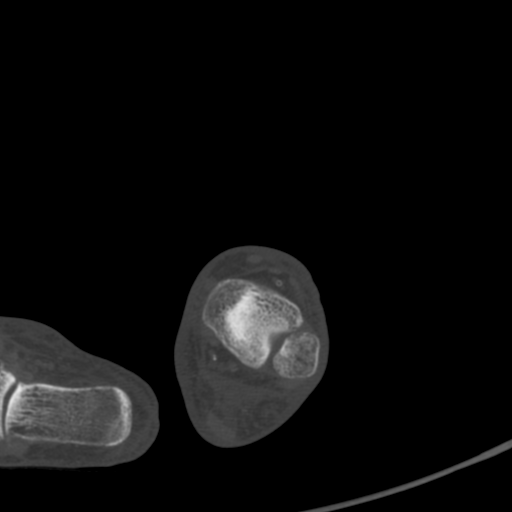

[Series 9: lower ext cor st · coronal · 0.31mm/px · 3 of 166 slices shown]
[im 59/166  bone]
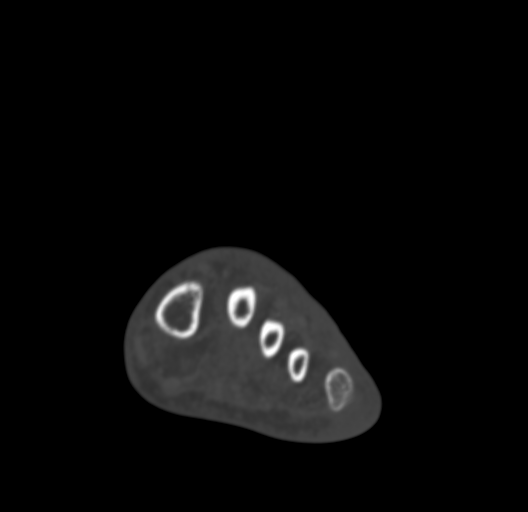
[im 75/166  bone]
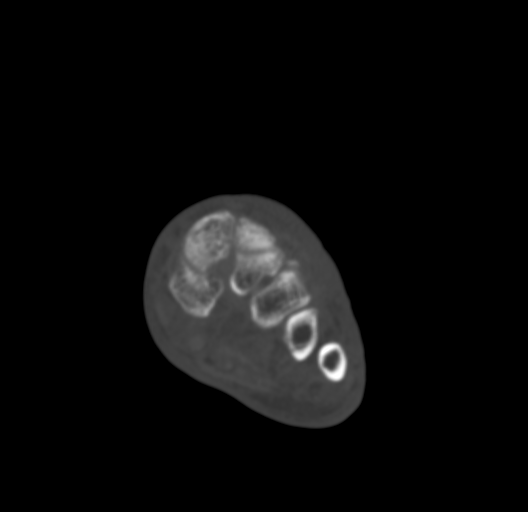
[im 91/166  bone]
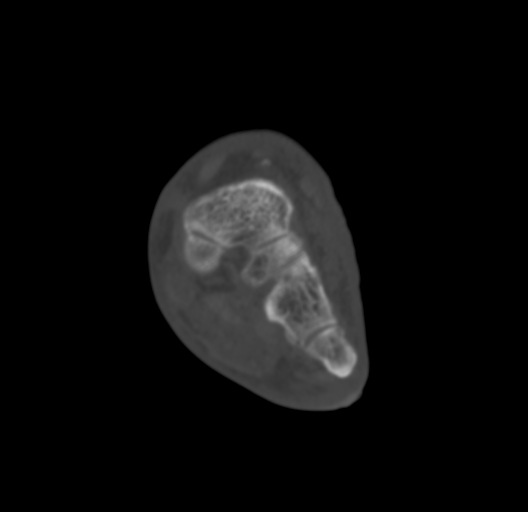

[Series 10: lower ext sag st · sagittal · 0.32mm/px · 5 of 83 slices shown, 6 images]
[im 28/83  bone]
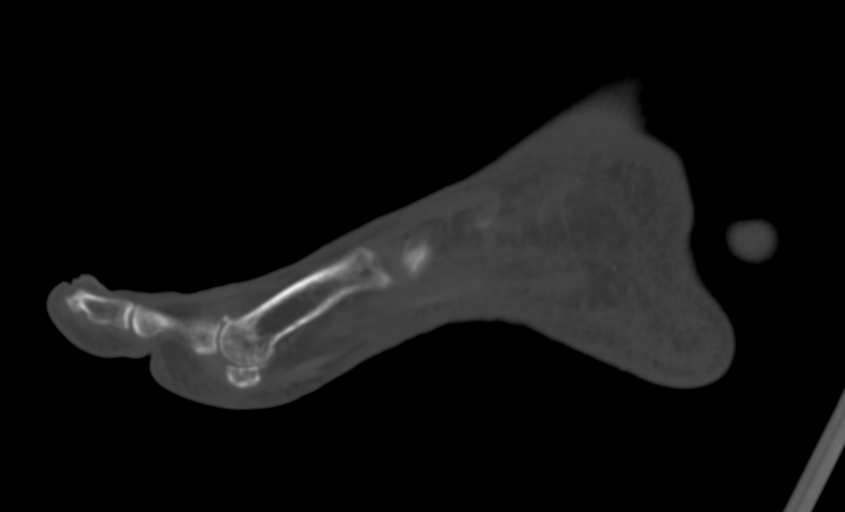
[im 35/83  bone]
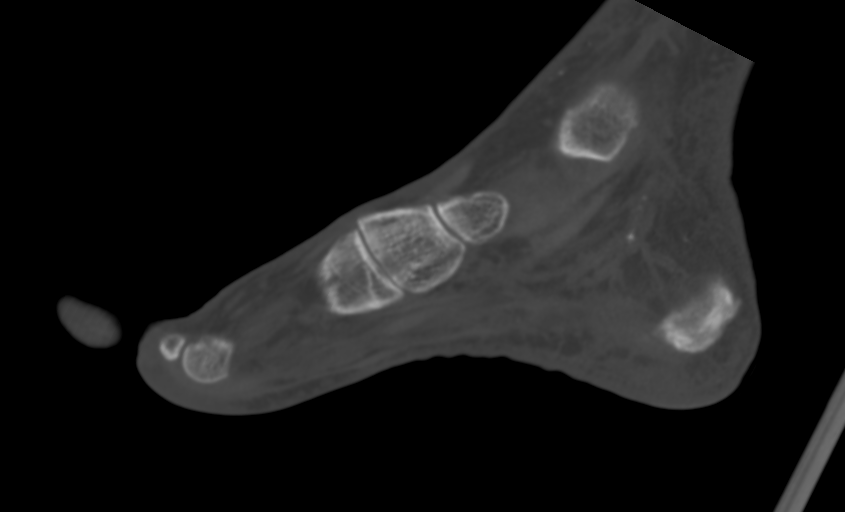
[im 42/83  soft-tissue]
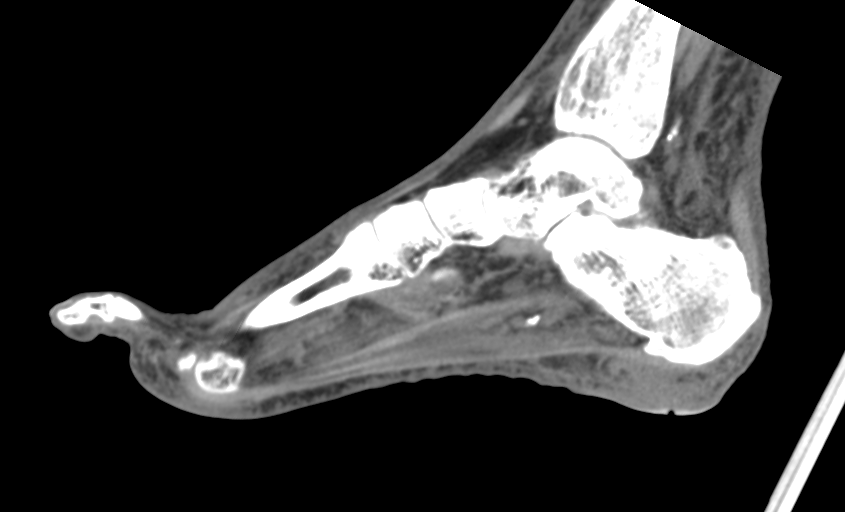
[im 42/83  bone]
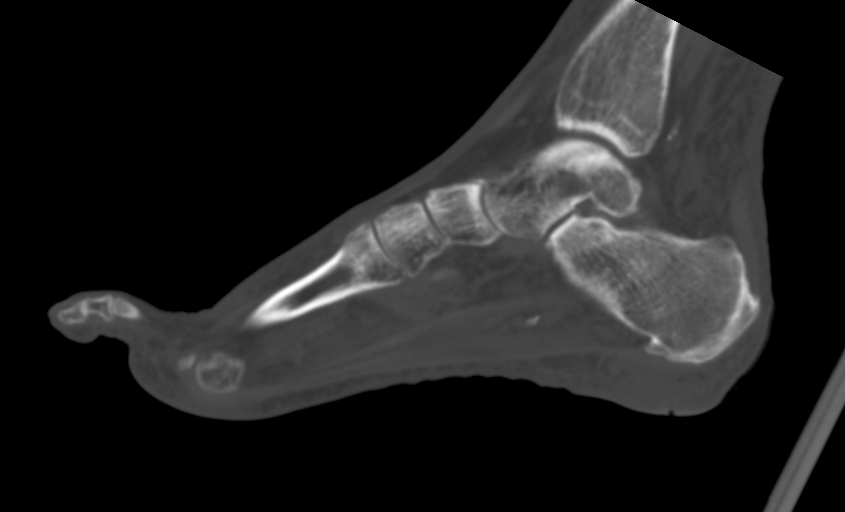
[im 48/83  bone]
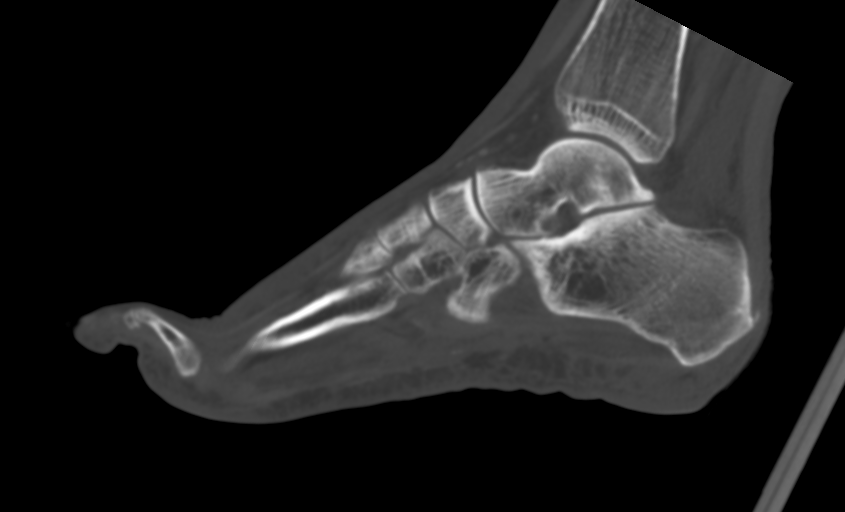
[im 55/83  bone]
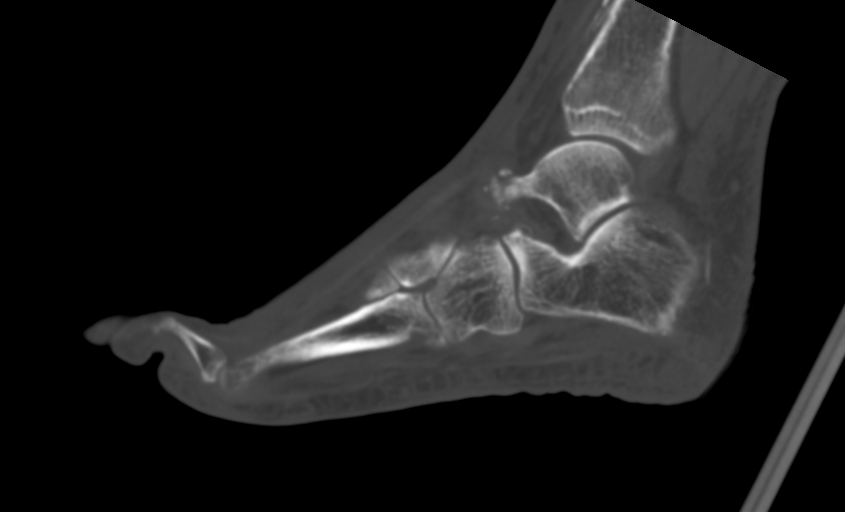

[12 of 33 positions shown; findings below may reference images not displayed]

FINDINGS: Bones/Joint/Cartilage

Bony destructive change is seen in the dorsal cortex of the middle
phalanx of the little toe and there is gas within the middle phalanx
consistent with emphysematous osteomyelitis. Loss of cortical bone
is also seen in the head of the proximal phalanx consistent with
osteomyelitis. No other acute bony abnormality is identified.
Negative for fracture or dislocation. Mild first MTP osteoarthritis
noted.

Ligaments

Suboptimally assessed by CT.

Muscles and Tendons

No intramuscular fluid collection. Mild atrophy of intrinsic
musculature noted. No tendon tear.

Soft tissues

Small amount of gas is seen in the soft tissues of the little toe.
IMPRESSION: Findings consistent with osteomyelitis in the head of the proximal
phalanx and throughout the middle phalanx of the little toe. Gas in
the middle phalanx consistent with emphysematous osteomyelitis
noted.

Negative for abscess or evidence of septic joint.

## 2020-03-11 ENCOUNTER — Ambulatory Visit: Payer: Medicare Other | Admitting: Podiatry

## 2020-03-12 DIAGNOSIS — M25529 Pain in unspecified elbow: Secondary | ICD-10-CM | POA: Insufficient documentation

## 2020-03-13 DIAGNOSIS — S52131A Displaced fracture of neck of right radius, initial encounter for closed fracture: Secondary | ICD-10-CM | POA: Insufficient documentation

## 2020-03-16 ENCOUNTER — Ambulatory Visit (HOSPITAL_COMMUNITY)
Admission: RE | Admit: 2020-03-16 | Discharge: 2020-03-16 | Disposition: A | Discharge status: Home / Self Care | Payer: Medicare Other | Source: Ambulatory Visit | Attending: Internal Medicine | Admitting: Internal Medicine

## 2020-03-16 DIAGNOSIS — I5042 Chronic combined systolic (congestive) and diastolic (congestive) heart failure: Secondary | ICD-10-CM

## 2020-03-16 NOTE — Progress Notes (Signed)
     Cardiomems Update   Implant Date 02/05/20   RHC RA mean 2 RV 36/6 PA 36/14, mean 24 PCWP mean 17  Oxygen saturations: PA 61% AO 94% Cardiac Output (Fick) 5.57  Cardiac Index (Fick) 2.72  Cardiac Output (Thermo) 4.21 Cardiac Index (Thermo) 2.06     PAD Goal  23 Current PAD 16   Current Labs 03/02/2020  Creatinine 1.8  BUN 41 Potassium 4    Recommendations  I have reviewed the patients PA monitoring at least weekly to bring PA pressures within optimal range. Continue current plan.   Erma Raiche NP-C  4:37 PM

## 2020-03-17 ENCOUNTER — Ambulatory Visit: Payer: Medicare Other | Admitting: Cardiovascular Disease

## 2020-03-19 IMAGING — DX RIGHT WRIST - 2 VIEW
2 series · 2 of 2 positions shown · non-contrast
Comparison: None.

CLINICAL DATA: Pain and swelling in the right wrist status post
peripheral IV.

EXAM:
RIGHT WRIST - 2 VIEW

[x wrist pa right]
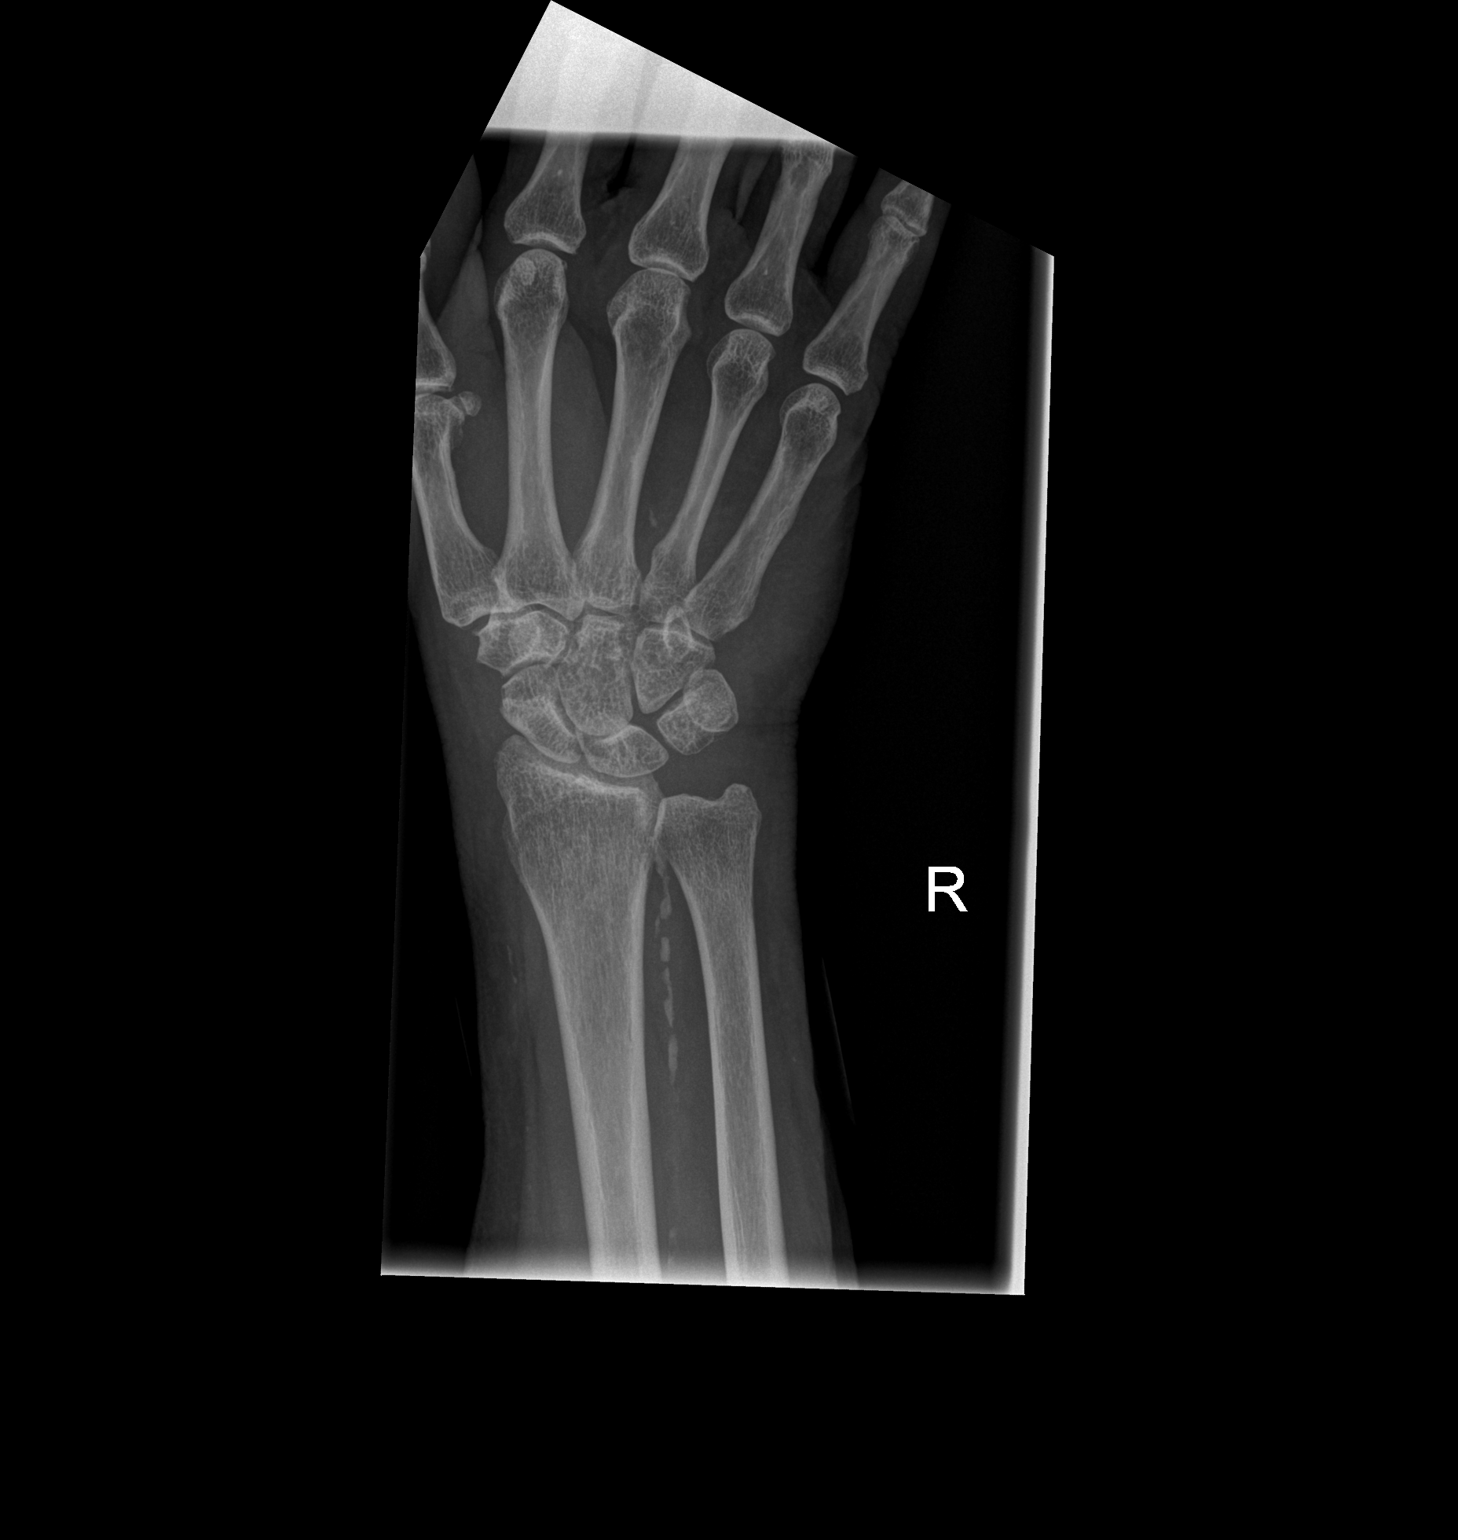

[x wrist lat right]
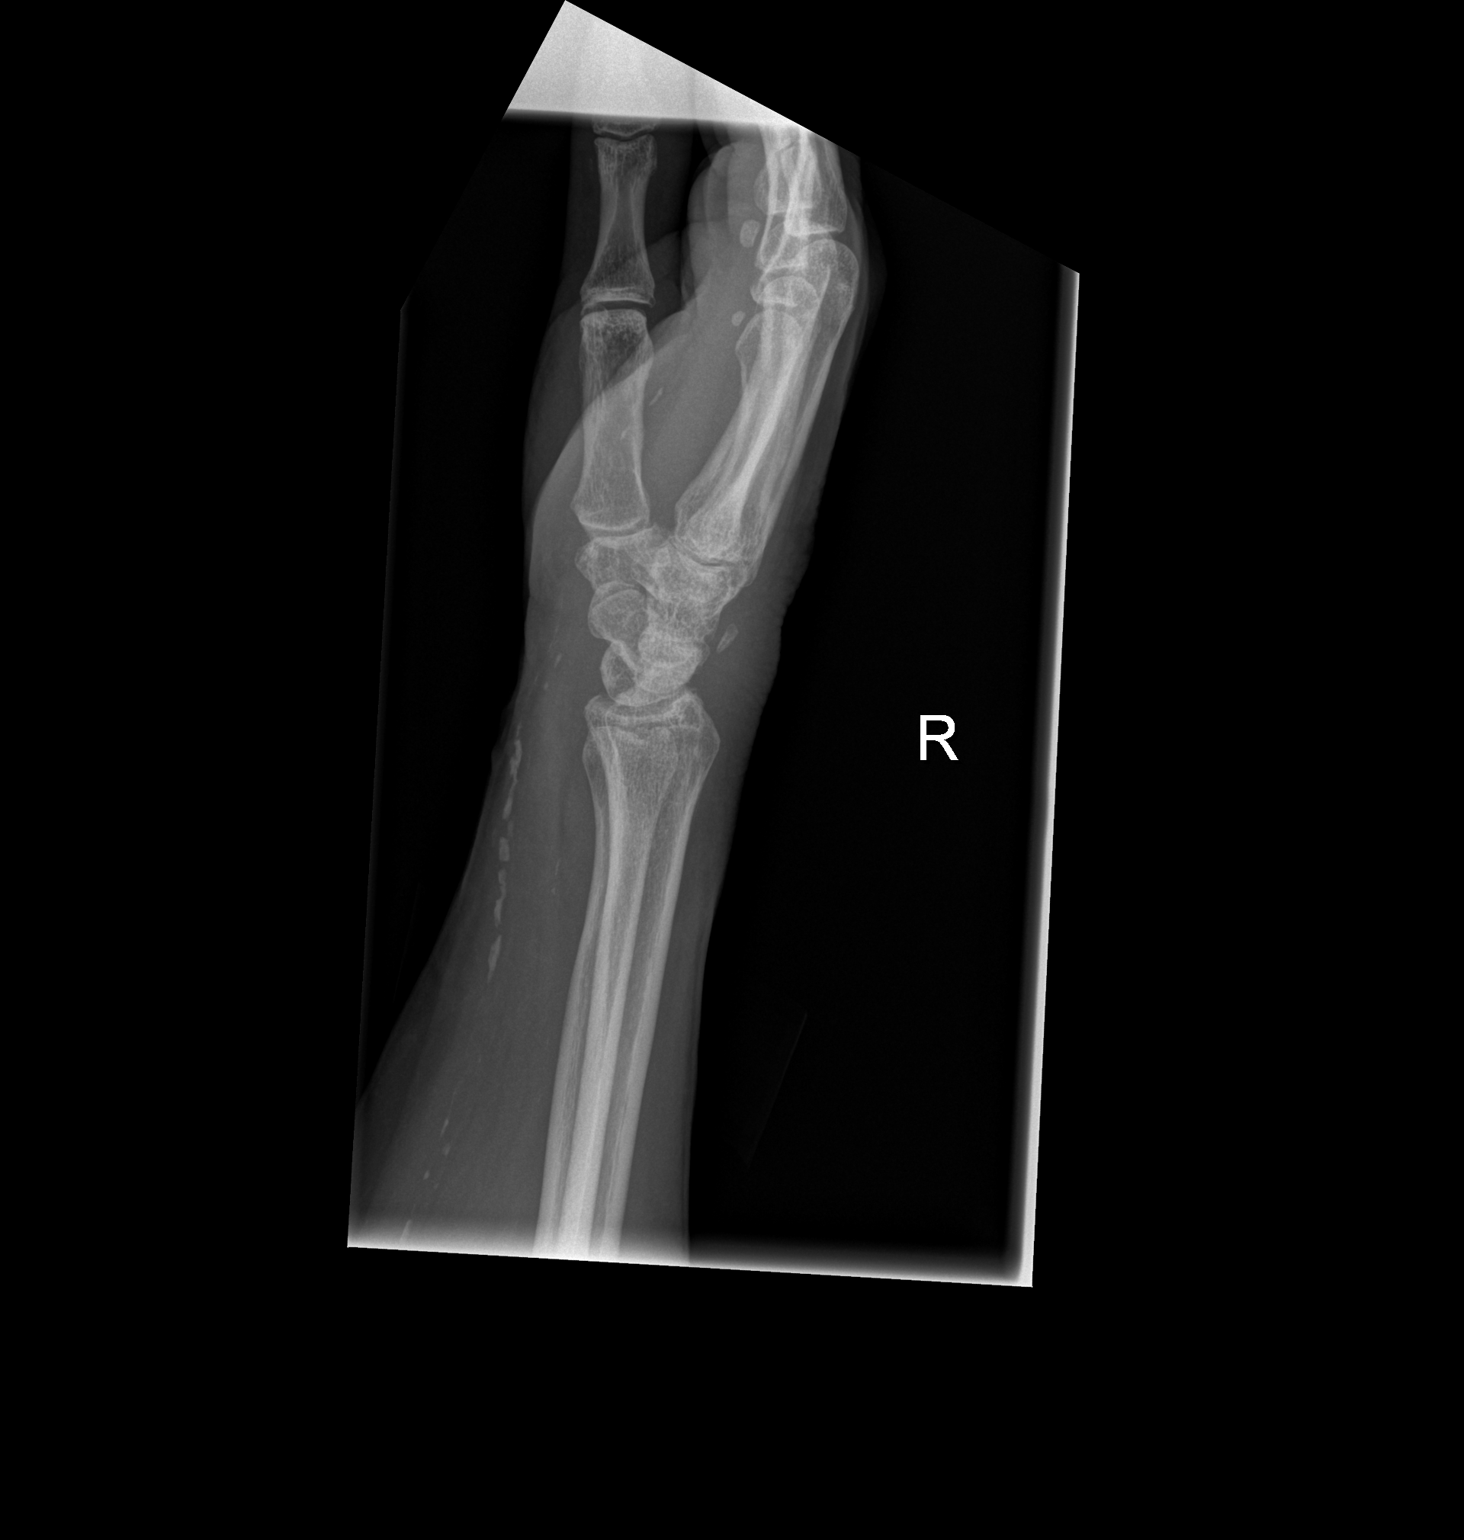

[2 of 2 positions shown; findings below may reference images not displayed]

FINDINGS: No fracture or dislocation. No suspicious focal osseous lesion.
Minimal first carpometacarpal joint osteoarthritis. No osseous
erosions or periosteal reaction. Mild dorsal right wrist soft tissue
swelling. Vascular calcifications in the volar soft tissues. No
radiopaque foreign body.
IMPRESSION: Mild dorsal right wrist soft tissue swelling. No acute osseous
abnormality. Minimal first carpometacarpal joint osteoarthritis.

## 2020-03-20 ENCOUNTER — Ambulatory Visit: Payer: Medicare Other | Admitting: Cardiovascular Disease

## 2020-03-20 DIAGNOSIS — M6281 Muscle weakness (generalized): Secondary | ICD-10-CM | POA: Insufficient documentation

## 2020-03-25 ENCOUNTER — Telehealth: Payer: Medicare Other | Admitting: Cardiology

## 2020-03-25 ENCOUNTER — Other Ambulatory Visit: Payer: Self-pay

## 2020-03-25 NOTE — Progress Notes (Deleted)
Virtual Visit via Telephone Note   This visit type was conducted due to national recommendations for restrictions regarding the COVID-19 Pandemic (e.g. social distancing) in an effort to limit this patient's exposure and mitigate transmission in our community.  Due to her co-morbid illnesses, this patient is at least at moderate risk for complications without adequate follow up.  This format is felt to be most appropriate for this patient at this time.  The patient did not have access to video technology/had technical difficulties with video requiring transitioning to audio format only (telephone).  All issues noted in this document were discussed and addressed.  No physical exam could be performed with this format.  Please refer to the patient's chart for her  consent to telehealth for [facility].  Evaluation Performed:  Follow-up visit  This visit type was conducted due to national recommendations for restrictions regarding the COVID-19 Pandemic (e.g. social distancing).  This format is felt to be most appropriate for this patient at this time.  All issues noted in this document were discussed and addressed.  No physical exam was performed (except for noted visual exam findings with Video Visits).  Please refer to the patient's chart (MyChart message for video visits and phone note for telephone visits) for the patient's consent to telehealth for [facility].  Date:  03/25/2020   ID:  JAZARIAH TEALL, DOB [birth year], MRN [id number]  Patient Location:  Home  Provider location:   [place]  PCP:  Loretha Brasil, FNP  Cardiologist:  Quay Burow, MD Loralie Champagne, MD Sleep Medicine:  Fransico Him, MD Electrophysiologist:  None   Chief Complaint:  OSA  History of Present Illness:    [name] is a 59 y.o. female who presents via audio/video conferencing for a telehealth visit today.    This is a 59yo female with a hx of PAD, carotid stenosis, mild CAD, PAF and chronic  systolic CHF.  She has a hx of OSA in the past but has been intolerant to PAP therapy.  She underwent split night sleep study for Epworth sleepiness score of 17 in January 2018 which showed severe OSA with an AHI of 58/hr and nocturnal hypoxemia with O2 sats as low as 62%.  She had moderate snoring.  She did not reach REM sleep.  She was titrated on CPAP to 19cm H2O and started therapy but never followed up for her sleep apnea appt.  She was intolerant to PAP therapy and stopped using it.  She is now referred back to discuss options for treatment.   The patient does not have symptoms concerning for COVID-19 infection (fever, chills, cough, or new shortness of breath).    Prior CV studies:   The following studies were reviewed today:  Sleep study 1.2018  Past Medical History:  Diagnosis Date  . Alcohol abuse   . Alcoholic cirrhosis ([place])   . Anemia   . Anxiety   . Arthritis    "knees" (11/26/2018)  . B12 deficiency   . CAD (coronary artery disease)    a. 11/10/2014 Cath: LM nl, LAD min irregs, D1 30 ost, D2 50d, LCX 73m, OM1 80 p/m (1.5 mm vessel), OM2 73m, RCA nondom 55m-->med rx.. Demand ischemia in the setting of rapid a-fib.  . Cardiomyopathy ([place])   . Carotid artery disease ([place])    a. 01/2015 Carotid Angio: RICA 681, LICA 27N; b. 11/6999 s/p L CEA; c. 05/2019 Carotid U/S: RICA 100. RECA >50. LICA 7-49%.  . Cellulitis  lower extremities  . Chronic combined systolic and diastolic CHF (congestive heart failure) ([place])    a. 10/2014 Echo: EF 40-45%; b. 10/2018 Echo: EF 45-50%, gr2 DD; c. 11/2019 Echo: EF 50%, mild LVH, gr2 DD (restrictive), antlat HK, Nl RV fxn. Mild BAE. RVSP 59.1mmHg.  . CKD (chronic kidney disease), stage III   . Cocaine abuse ([name])   . COPD (chronic obstructive pulmonary disease) ([place])   . Critical lower limb ischemia   . Depression   . Diabetic peripheral neuropathy ([place])   . Dyspnea   . Elevated troponin    a. Chronic elevation.  [name] GERD (gastroesophageal  reflux disease)   . Hyperlipemia   . Hypertension   . Hypokalemia   . Hypomagnesemia   . Hypothyroidism   . Marijuana abuse   . Narcotic abuse ([place])   . Noncompliance   . NSVT (nonsustained ventricular tachycardia) ([place])   . Obesity   . PAF (paroxysmal atrial fibrillation) ([place])   . Paroxysmal atrial tachycardia ([place])   . Peripheral arterial disease ([place])    a. 01/2015 Angio/PTA: RSFA 99 (atherectomy/pta) - 1 vessel runoff via diff dzs peroneal; b. 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  . Pneumonia    "once or twice" (11/26/2018)  . Poorly controlled type 2 diabetes mellitus ([place])   . Renal insufficiency    a. 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  [name] Sleep apnea    "couldn't handle wearing the mask" (11/26/2018)  . Symptomatic bradycardia    a. Avoid AV blocking agent per EP. Prev req temp wire in 2017.  . Tobacco abuse    Past Surgical History:  Procedure Laterality Date  . ABDOMINAL AORTOGRAM N/A 06/26/2019   Procedure: ABDOMINAL AORTOGRAM;  Surgeon: Wellington Hampshire, MD;  Location: [place] CV [facility];  Service: Cardiovascular;  Laterality: N/A;  . AMPUTATION Right 06/14/2017   Procedure: Right foot transmetatarsal amputation;  Surgeon: Newt Minion, MD;  Location: [place];  Service: Orthopedics;  Laterality: Right;  . AMPUTATION Left 06/28/2019   Procedure: AMPUTATION LEFT FIFTH TOE;  Surgeon: Rosetta Posner, MD;  Location: [place];  Service: Vascular;  Laterality: Left;  . AMPUTATION TOE Right 04/28/2017   Procedure: AMPUTATION OF RIGHT SECOND RAY;  Surgeon: Newt Minion, MD;  Location: [place];  Service: Orthopedics;  Laterality: Right;  . CARDIAC CATHETERIZATION    . CARDIAC CATHETERIZATION N/A 01/12/2016   Procedure: Temporary Wire;  Surgeon: Minus Breeding, MD;  Location: [place] CV [facility];  Service: Cardiovascular;  Laterality: N/A;  . CARDIOVERSION  ~ 02/2013   "twice"   . CAROTID ANGIOGRAM N/A 01/15/2015   Procedure: CAROTID ANGIOGRAM;  Surgeon: [name], MD;   Location: [facility] CATH LAB;  Service: Cardiovascular;  Laterality: N/A;  . DILATION AND CURETTAGE OF UTERUS  1988  . ENDARTERECTOMY Left 02/19/2015   Procedure: LEFT CAROTID ENDARTERECTOMY ;  Surgeon: Serafina Mitchell, MD;  Location: [place];  Service: Vascular;  Laterality: Left;  . FEMORAL-TIBIAL BYPASS GRAFT Left 06/28/2019   Procedure: BYPASS GRAFT LEFT LEG FEMORAL TO ANTERIOR TIBIAL ARTERY using LEFT GREATER SAPHENOUS VEIN;  Surgeon: Rosetta Posner, MD;  Location: [place];  Service: Vascular;  Laterality: Left;  . LEFT HEART CATHETERIZATION WITH CORONARY ANGIOGRAM N/A 10/31/2014   Procedure: LEFT HEART CATHETERIZATION WITH CORONARY ANGIOGRAM;  Surgeon: Burnell Blanks, MD;  Location: [facility] CATH LAB;  Service: Cardiovascular;  Laterality: N/A;  . LOWER EXTREMITY ANGIOGRAM N/A 09/10/2013  Procedure: LOWER EXTREMITY ANGIOGRAM;  Surgeon: [name], MD;  Location: [facility] CATH LAB;  Service: Cardiovascular;  Laterality: N/A;  . LOWER EXTREMITY ANGIOGRAM N/A 01/15/2015   Procedure: LOWER EXTREMITY ANGIOGRAM;  Surgeon: [name], MD;  Location: [facility] CATH LAB;  Service: Cardiovascular;  Laterality: N/A;  . LOWER EXTREMITY ANGIOGRAPHY N/A 04/13/2017   Procedure: Lower Extremity Angiography;  Surgeon: [name], MD;  Location: [place] CV LAB;  Service: Cardiovascular;  Laterality: N/A;  . LOWER EXTREMITY ANGIOGRAPHY Left 06/26/2019   Procedure: LOWER EXTREMITY ANGIOGRAPHY;  Surgeon: Wellington Hampshire, MD;  Location: [place] CV LAB;  Service: Cardiovascular;  Laterality: Left;  . PERIPHERAL VASCULAR BALLOON ANGIOPLASTY Left 06/26/2019   Procedure: PERIPHERAL VASCULAR BALLOON ANGIOPLASTY;  Surgeon: Wellington Hampshire, MD;  Location: [place] CV LAB;  Service: Cardiovascular;  Laterality: Left;  unable to cross lt sfa occlusion  . PERIPHERAL VASCULAR INTERVENTION  04/13/2017   Procedure: Peripheral Vascular Intervention;  Surgeon: [name], MD;  Location: [place] CV LAB;  Service:  Cardiovascular;;  . PRESSURE SENSOR/CARDIOMEMS N/A 02/05/2020   Procedure: PRESSURE SENSOR/CARDIOMEMS;  Surgeon: Larey Dresser, MD;  Location: [place] CV LAB;  Service: Cardiovascular;  Laterality: N/A;  . VEIN HARVEST Left 06/28/2019   Procedure: LEFT LEG GREATER SAPHENOUS VEIN HARVEST;  Surgeon: Rosetta Posner, MD;  Location: [place];  Service: Vascular;  Laterality: Left;     No outpatient medications have been marked as taking for the 03/25/20 encounter (Appointment) with Sueanne Margarita, MD.     Allergies:   Gabapentin and Lyrica [pregabalin]   Social History   Tobacco Use  . Smoking status: Current Some Day Smoker    Packs/day: 1.00    Years: 44.00    Pack years: 44.00    Types: E-cigarettes, Cigarettes  . Smokeless tobacco: Former Systems developer    Types: Snuff  Substance Use Topics  . Alcohol use: Yes    Comment: 11/26/2018 "couple drinks/month maybe"  . Drug use: Not Currently    Types: "Crack" cocaine, Marijuana, Oxycodone     Family Hx: The patient's family history includes Breast cancer in her mother; Cancer in her mother; Clotting disorder in her mother; Diabetes in her mother; Emphysema in her sister; Heart attack in her mother; Heart disease in her father and mother; Hypertension in her father and mother.  ROS:   Please see the history of present illness.     All other systems reviewed and are negative.   Labs/Other Tests and Data Reviewed:    Recent Labs: 12/01/2019: B Natriuretic Peptide 938.7 12/05/2019: Magnesium 2.4 01/28/2020: ALT 60; Platelets 343 02/05/2020: Hemoglobin 12.2; Hemoglobin 12.2 03/02/2020: BUN 41; Creatinine, Ser 1.81; Potassium 4.0; Sodium 134   Recent Lipid Panel Lab Results  Component Value Date/Time   CHOL 134 01/28/2020 10:10 AM   TRIG 112 01/28/2020 10:10 AM   HDL 48 01/28/2020 10:10 AM   CHOLHDL 2.8 01/28/2020 10:10 AM   LDLCALC 64 01/28/2020 10:10 AM    Wt Readings from Last 3 Encounters:  03/03/20 218 lb (98.9 kg)  03/02/20 213  lb 6.4 oz (96.8 kg)  02/05/20 222 lb (100.7 kg)     Objective:    Vital Signs:  LMP 11/27/2012    CONSTITUTIONAL:  Well nourished, well developed female in no acute distress.  EYES: anicteric MOUTH: oral mucosa is pink RESPIRATORY: Normal respiratory effort, symmetric expansion CARDIOVASCULAR: No peripheral edema SKIN: No rash, lesions or ulcers MUSCULOSKELETAL: no digital cyanosis NEURO:  Cranial Nerves II-XII grossly intact, moves all extremities PSYCH: Intact judgement and insight.  A&O x 3, Mood/affect appropriate   ASSESSMENT & PLAN:    1.  OSA -severe OSA with an AHI of 58/hr and nocturnal hypoxemia with O2 sats as low as 62%. -she was started on CPAP at 19cm H2O but was intolerant and never followed up for sleep appt -we have discussed the various options for treatment of her OSA -given the severity, I think that an oral device would not be the best option -I suspect that she would tolerate BiPAP better and be able to get to a lower pressure which is more tolerable -We did discuss the hypoglossal nerve stimulator but she is not a candidate at this time due to her BMI>32.  2.  HTN -BP controlled  -continue amlodipine 10mg  daily, Bisoprolol 2.5mg  daily and spiro 25mg  daily  3.  Obesity -I have encouraged her to get into a routine exercise program and cut back on carbs and portions.   COVID-19 Education: The signs and symptoms of COVID-19 were discussed with the patient and how to seek care for testing (follow up with PCP or arrange E-visit).  The importance of social distancing was discussed today.  Patient Risk:   After full review of this patient's clinical status, I feel that they are at least moderate risk at this time.  Time:   Today, I have spent 20 minutes on telemedicine discussing medical problems including OSA, HTN, Obesity and reviewing patient's chart including Sleep study 2018.  Medication Adjustments/Labs and Tests Ordered: Current medicines are  reviewed at length with the patient today.  Concerns regarding medicines are outlined above.  Tests Ordered: No orders of the defined types were placed in this encounter.  Medication Changes: No orders of the defined types were placed in this encounter.   Disposition:  Follow up in 1 year(s)  Signed, Fransico Him, MD  03/25/2020 7:06 AM    [facility]

## 2020-03-27 ENCOUNTER — Ambulatory Visit: Payer: Medicare Other | Admitting: Cardiovascular Disease

## 2020-03-31 ENCOUNTER — Ambulatory Visit: Payer: Medicare Other | Admitting: Cardiology

## 2020-04-03 ENCOUNTER — Ambulatory Visit (INDEPENDENT_AMBULATORY_CARE_PROVIDER_SITE_OTHER): Payer: Medicare Other | Admitting: Cardiovascular Disease

## 2020-04-03 ENCOUNTER — Encounter: Payer: Self-pay | Admitting: Cardiovascular Disease

## 2020-04-03 ENCOUNTER — Other Ambulatory Visit: Payer: Self-pay

## 2020-04-03 DIAGNOSIS — I480 Paroxysmal atrial fibrillation: Secondary | ICD-10-CM | POA: Diagnosis not present

## 2020-04-03 DIAGNOSIS — E785 Hyperlipidemia, unspecified: Secondary | ICD-10-CM | POA: Insufficient documentation

## 2020-04-03 DIAGNOSIS — E782 Mixed hyperlipidemia: Secondary | ICD-10-CM

## 2020-04-03 DIAGNOSIS — I5042 Chronic combined systolic (congestive) and diastolic (congestive) heart failure: Secondary | ICD-10-CM | POA: Diagnosis not present

## 2020-04-03 DIAGNOSIS — I739 Peripheral vascular disease, unspecified: Secondary | ICD-10-CM

## 2020-04-03 DIAGNOSIS — I10 Essential (primary) hypertension: Secondary | ICD-10-CM

## 2020-04-03 DIAGNOSIS — Z720 Tobacco use: Secondary | ICD-10-CM

## 2020-04-03 NOTE — Assessment & Plan Note (Signed)
History of essential hypertension blood pressure measured today 144/68.  She is on bisoprolol, and Aldactone as well as amlodipine.

## 2020-04-03 NOTE — Assessment & Plan Note (Signed)
History of ongoing tobacco abuse of 1/2 pack/day recalcitrant to respect to modification.

## 2020-04-03 NOTE — Assessment & Plan Note (Signed)
History of PAF maintaining sinus rhythm on Eliquis and amiodarone. 

## 2020-04-03 NOTE — Progress Notes (Signed)
04/03/2020 [name]   10/12/1961  [id number]  Primary Physician Loretha Brasil, FNP Primary Cardiologist: [name] MD Garret Reddish, [place], [place]  HPI:  [name] is a 59 y.o.  moderately overweight single Caucasian female mother of one child who lives with her sister. She wasoriginallyreferred by Dr. Franki Monte at [facility] for [facility] vascular evaluation because of critical limb ischemia. I last saw her in the office  12/25/2018. She sees Dr. Jenkins Rouge at [facility] for cardiomyopathy and paroxysmal atrial fibrillation. Her cardiac risk factors include a 20-pack-year history of tobacco abuse (she has stopped smoking), treated diabetes, and hypertension as well as family history of heart disease. She's never had a heart attack or stroke. She does have COPD. She had paroxysmal atrial fibrillation in the past and has undergone cardioversion in [place].. The lower extremity arterial Doppler studies performed in our office suggested critical limb ischemia with an occluded SFA and popliteal arteries bilaterally with tibial vessel disease as well. She complains of lifestyle limiting claudication. I performed lower extremity angiography on 09/10/13 revealing occluded SFAs bilaterally with chronic occluded tibial vessels as well. I performed [employer] directional atherectomy of her entire right SFA with an excellent angiographic result removing a copious amount of atherosclerotic plaque. The ulcer on her right heel ultimately healed. She does continue to smoke one pack per day. She was recently admitted to [facility] because of atrial fibrillation with rapid ventricular response. She underwent cardiac catheterization on 10/31/14 revealing an ejection fraction of 30-35% with disease that was ultimately treated medically. Her carotid Doppler showed high-grade bilateral ICA disease and lower extremity Dopplers revealed restenosis within the right SFA with a ABI  of 0.34. She is symptomatic on that side. I performed angiography on her 01/15/15 revealing an occluded right internal carotid artery, 95% proximal left internal carotid artery stenosis, long 99% stenosis within the right SFA and an occluded left SFA. I performed [facility] 1 directional atherectomy and drug-eluting balloon angioplasty of her right SFA with an excellent Angiographic and clinical result. She underwent left carotid endarterectomy by Dr. Trula Slade on 02/19/15 which was uncomplicated. Since I saw her 6 months ago she's been remaining clinically stable. She denies chest pain or shortness of breath. She continues to have claudication.Lower extremity Doppler studies performed 01/23/15 revealed ABIs in the 0.6 range bilaterally with a patent right SFA. She continues to complain of claudication however.recent Dopplers performed 12/22/15 revealed a right TBI 0. 17. She was admitted to the hospital with altered mental status and sepsis 04/06/17 for one week. 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 I suspect performed angiography of her 04/13/17 revealing an occluded right SFA was 0 vessel runoff. Atherectomy I's are entire right SFA and she subsequently underwent right second ray resection by Dr. Sharol Given to on 04/28/17.She has stopped smoking since that hospitalization. She also underwent a right transmetatarsal amputation by Dr.Dudawhich has subsequently healed. Recent lower extremity Dopplers performed 05/29/17 revealed a right ABI 0.65 with a patent right SFA and a left ABI 0.31.   She does have diastolic heart failure followed by Dr. Aundra Dubin in the heart failure clinic.  She has had a CardioMEMS device implanted to follow her pulmonary pressures and lung impedance..  She does admit to dietary indiscretion.  She was diuresed 30 pounds.  She is aware of salt restriction.  She is on oral diuretics and weighs herself on a daily basis.  She  denies claudication but does have pain in her feet suggestive of  diabetic peripheral neuropathy.  She was admitted to the hospital in August of last year with an infected diabetic left foot ulcer and ultimately underwent left fem-tib bypass grafting by Dr. Sherren Mocha early 06/28/2019 which he follows by duplex ultrasound on outpatient basis.  She unfortunately is gone back to smoking half a pack per day.  She denies chest pain or shortness of breath.  She is aware of salt restriction.   Current Meds  Medication Sig  . ACCU-CHEK GUIDE test strip USE TO CHECK BLOOD SUGAR FOUR TIMES DAILY  . acetaminophen (TYLENOL) 500 MG tablet Take 2 tablets (1,000 mg total) by mouth every 6 (six) hours as needed for mild pain.  [name] albuterol (VENTOLIN HFA) 108 (90 Base) MCG/ACT inhaler Inhale 2 puffs into the lungs every 6 (six) hours as needed for wheezing or shortness of breath.  [name] amiodarone (PACERONE) 200 MG tablet Take 100 mg by mouth daily.  [name] amLODipine (NORVASC) 10 MG tablet Take 1 tablet (10 mg total) by mouth daily.  [name] apixaban (ELIQUIS) 5 MG TABS tablet Take 1 tablet (5 mg total) by mouth 2 (two) times daily.  . Aspirin-Caffeine (BC FAST PAIN RELIEF PO) Take 1 packet by mouth daily as needed (pain).  [name] atorvastatin (LIPITOR) 40 MG tablet TAKE 1 TABLET BY MOUTH ONCE EVERY EVENING  . bisoprolol (ZEBETA) 5 MG tablet Take 1 tablet (5 mg total) by mouth daily.  . budesonide-formoterol (SYMBICORT) 160-4.5 MCG/ACT inhaler Inhale 2 puffs into the lungs 2 (two) times daily. (Patient taking differently: Inhale 2 puffs into the lungs 2 (two) times daily as needed (shortness of breath). )  . cholecalciferol (VITAMIN D) 1000 units tablet Take 1 tablet (1,000 Units total) by mouth daily.  . clopidogrel (PLAVIX) 75 MG tablet Take 1 tablet (75 mg total) by mouth daily.  . colchicine 0.6 MG tablet Take 1-2 tablets (0.6-1.2 mg total) by mouth See admin instructions. Take 1.2mg  by mouth initially at onset of gout flare up, then take 0.6mg  in one hour, then stop.  . empagliflozin (JARDIANCE) 10  MG TABS tablet Take 10 mg by mouth daily before breakfast.  . folic acid (FOLVITE) 1 MG tablet Take 1 tablet (1 mg total) by mouth daily.  . Insulin Glargine (LANTUS SOLOSTAR) 100 UNIT/ML Solostar Pen Inject 40 Units into the skin at bedtime.  . Insulin Lispro (HUMALOG KWIKPEN) 200 UNIT/ML SOPN Inject 8 Units into the skin 3 (three) times daily with meals. (Patient taking differently: Inject 8-15 Units into the skin 3 (three) times daily with meals. )  . levothyroxine (SYNTHROID, LEVOTHROID) 50 MCG tablet Take 1 tablet (50 mcg total) by mouth daily.  . Multiple Vitamin (MULTIVITAMIN WITH MINERALS) TABS tablet Take 1 tablet by mouth daily.  . nortriptyline (PAMELOR) 50 MG capsule Take 50 mg by mouth at bedtime.  [name] spironolactone (ALDACTONE) 25 MG tablet Take 1 tablet (25 mg total) by mouth daily.  [name] torsemide (DEMADEX) 20 MG tablet Take 2 tablets (40 mg total) by mouth 2 (two) times daily.     Allergies  Allergen Reactions  . Gabapentin Nausea And Vomiting and Other (See Comments)    POSSIBLE SHAKING? TAKING MED CURRENTLY  . Lyrica [Pregabalin] Other (See Comments)    Shaking Pt still taking lyrica--"probably will stop taking"     Social History   Socioeconomic History  . Marital status: Divorced    Spouse name: Not on file  . Number of  children: 1  . Years of education: 64  . Highest education level: 12th grade  Occupational History  . Occupation: disabled  Tobacco Use  . Smoking status: Current Some Day Smoker    Packs/day: 1.00    Years: 44.00    Pack years: 44.00    Types: E-cigarettes, Cigarettes  . Smokeless tobacco: Former Systems developer    Types: Snuff  Substance and Sexual Activity  . Alcohol use: Yes    Comment: 11/26/2018 "couple drinks/month maybe"  . Drug use: Not Currently    Types: "Crack" cocaine, Marijuana, Oxycodone  . Sexual activity: Not Currently  Other Topics Concern  . Not on file  Social History Narrative   Lives in [place], in [place] with sister.  They are  looking to move but don't have a place to go yet.     Social Determinants of Health   Financial Resource Strain:   . Difficulty of Paying Living Expenses:   Food Insecurity: No Food Insecurity  . Worried About Charity fundraiser in the Last Year: Never true  . Ran Out of Food in the Last Year: Never true  Transportation Needs: No Transportation Needs  . Lack of Transportation (Medical): No  . Lack of Transportation (Non-Medical): No  Physical Activity: Insufficiently Active  . Days of Exercise per Week: 1 day  . Minutes of Exercise per Session: 10 min  Stress:   . Feeling of Stress :   Social Connections:   . Frequency of Communication with Friends and Family:   . Frequency of Social Gatherings with Friends and Family:   . Attends Religious Services:   . Active Member of Clubs or Organizations:   . Attends Archivist Meetings:   [name] Marital Status:   Intimate Partner Violence:   . Fear of Current or Ex-Partner:   . Emotionally Abused:   [name] Physically Abused:   . Sexually Abused:      Review of Systems: General: negative for chills, fever, night sweats or weight changes.  Cardiovascular: negative for chest pain, dyspnea on exertion, edema, orthopnea, palpitations, paroxysmal nocturnal dyspnea or shortness of breath Dermatological: negative for rash Respiratory: negative for cough or wheezing Urologic: negative for hematuria Abdominal: negative for nausea, vomiting, diarrhea, bright red blood per rectum, melena, or hematemesis Neurologic: negative for visual changes, syncope, or dizziness All other systems reviewed and are otherwise negative except as noted above.    Blood pressure (!) 144/68, pulse 64, height 5\' 4"  (1.626 m), weight 212 lb 12.8 oz (96.5 kg), last menstrual period 11/27/2012, SpO2 97 %.  General appearance: alert and no distress Neck: no adenopathy, no carotid bruit, no JVD, supple, symmetrical, trachea midline and thyroid not enlarged, symmetric,  no tenderness/mass/nodules Lungs: clear to auscultation bilaterally Heart: regular rate and rhythm, S1, S2 normal, no murmur, click, rub or gallop Extremities: extremities normal, atraumatic, no cyanosis or edema Pulses: Diminished pedal pulses Skin: Chronic reddish discoloration of both legs consistent with venous stasis Neurologic: Alert and oriented X 3, normal strength and tone. Normal symmetric reflexes. Normal coordination and gait  EKG not performed today  ASSESSMENT AND PLAN:   Claudication [facility]) History of PAD status post right SFA intervention back in 01/15/2015 by myself with one-vessel runoff via the peroneal and again 04/06/2017.  She does have a known occluded left SFA.  She was seen in the hospital 06/21/2019 with an ischemic left diabetic foot ulcer and underwent fem-tib bypass grafting by Dr. Sherren Mocha early 03/28/2019 which he follows by  duplex ultrasound in his office.  She denies claudication.  She did have amputation of that toe as well.  PAF (paroxysmal atrial fibrillation) ([name]) History of PAF maintaining sinus rhythm on Eliquis and amiodarone.  Tobacco abuse History of ongoing tobacco abuse of 1/2 pack/day recalcitrant to respect to modification.  Chronic combined systolic and diastolic CHF (congestive heart failure) ([name]) History of combined systolic and diastolic heart failure followed by Dr. Aundra Dubin.  She has had a CardioMEMS implanted.  Essential hypertension History of essential hypertension blood pressure measured today 144/68.  She is on bisoprolol, and Aldactone as well as amlodipine.  Hyperlipidemia History of hyperlipidemia on high-dose statin therapy with lipid profile performed 01/28/2020 revealing total cholesterol 134, LDL of 64 and HDL 48.      [name] MD FACP,FACC,FAHA, [facility] 04/03/2020 9:49 AM

## 2020-04-03 NOTE — Patient Instructions (Signed)
Medication Instructions:  °The current medical regimen is effective;  continue present plan and medications. ° °*If you need a refill on your cardiac medications before your next appointment, please call your pharmacy* ° ° °Follow-Up: °At [facility], you and your health needs are our priority.  As part of our continuing mission to provide you with exceptional heart care, we have created designated Provider Care Teams.  These Care Teams include your primary Cardiologist (physician) and Advanced Practice Providers (APPs -  Physician Assistants and Nurse Practitioners) who all work together to provide you with the care you need, when you need it. ° °We recommend signing up for the patient portal called "MyChart".  Sign up information is provided on this After Visit Summary.  MyChart is used to connect with patients for Virtual Visits (Telemedicine).  Patients are able to view lab/test results, encounter notes, upcoming appointments, etc.  Non-urgent messages can be sent to your provider as well.   °To learn more about what you can do with MyChart, go to [email or web address].   ° °Your next appointment:   °12 month(s) ° °The format for your next appointment:   °In Person ° °Provider:   °Jonathan Berry, MD ° ° ° °

## 2020-04-03 NOTE — Assessment & Plan Note (Signed)
History of combined systolic and diastolic heart failure followed by Dr. Aundra Dubin.  She has had a CardioMEMS implanted.

## 2020-04-03 NOTE — Assessment & Plan Note (Signed)
History of hyperlipidemia on high-dose statin therapy with lipid profile performed 01/28/2020 revealing total cholesterol 134, LDL of 64 and HDL 48.

## 2020-04-03 NOTE — Assessment & Plan Note (Signed)
History of PAD status post right SFA intervention back in 01/15/2015 by myself with one-vessel runoff via the peroneal and again 04/06/2017.  She does have a known occluded left SFA.  She was seen in the hospital 06/21/2019 with an ischemic left diabetic foot ulcer and underwent fem-tib bypass grafting by Dr. Sherren Mocha early 03/28/2019 which he follows by duplex ultrasound in his office.  She denies claudication.  She did have amputation of that toe as well.

## 2020-04-20 ENCOUNTER — Other Ambulatory Visit: Payer: Self-pay

## 2020-04-20 ENCOUNTER — Ambulatory Visit (HOSPITAL_COMMUNITY)
Admission: RE | Admit: 2020-04-20 | Discharge: 2020-04-20 | Disposition: A | Discharge status: Home / Self Care | Payer: Medicare Other | Source: Ambulatory Visit | Attending: Adult Health | Admitting: Adult Health

## 2020-04-20 DIAGNOSIS — I5042 Chronic combined systolic (congestive) and diastolic (congestive) heart failure: Secondary | ICD-10-CM

## 2020-04-20 NOTE — Progress Notes (Signed)
PAD goal 23 mm hg  PAD reading 21 mm hg   I have reviewed current PAD from cardiomems device. PAD reading stable. .  Continue current diuretic regimen.   Follow monthly   Terianna Peggs NP-C  5:36 PM '

## 2020-04-28 ENCOUNTER — Other Ambulatory Visit (HOSPITAL_COMMUNITY): Payer: Self-pay | Admitting: Cardiology

## 2020-04-28 MED ORDER — AMIODARONE HCL 200 MG PO TABS: 100.0000 mg | ORAL_TABLET | Freq: Every day | ORAL | 6 refills | Status: DC

## 2020-05-02 ENCOUNTER — Other Ambulatory Visit: Payer: Self-pay

## 2020-05-02 ENCOUNTER — Ambulatory Visit (HOSPITAL_COMMUNITY)
Admission: EM | Admit: 2020-05-02 | Discharge: 2020-05-02 | Disposition: A | Discharge status: Home / Self Care | Payer: Medicare Other | Attending: Emergency Medicine | Admitting: Emergency Medicine

## 2020-05-02 ENCOUNTER — Encounter (HOSPITAL_COMMUNITY): Payer: Self-pay

## 2020-05-02 DIAGNOSIS — G8929 Other chronic pain: Secondary | ICD-10-CM

## 2020-05-02 MED ORDER — CYCLOBENZAPRINE HCL 10 MG PO TABS: 10.0000 mg | ORAL_TABLET | Freq: Two times a day (BID) | ORAL | 0 refills | Status: DC | PRN

## 2020-05-02 MED ORDER — TRAMADOL HCL 50 MG PO TABS: 50.0000 mg | ORAL_TABLET | Freq: Four times a day (QID) | ORAL | 0 refills | Status: DC | PRN

## 2020-05-02 NOTE — ED Triage Notes (Signed)
Pt states a week ago she was sitting on a stool and picking up leaves and when she layed down that night she started getting 10/10 left hip and leg pain. Pt numbness and tingling in left leg. Pt arrived to exam room in wheel chair.

## 2020-05-02 NOTE — ED Provider Notes (Signed)
[facility]    CSN: [id number] Arrival date & time: 05/02/20  1031      History   Chief Complaint Chief Complaint  Patient presents with  . hip/leg pain    HPI [name] is a 59 y.o. female. she presents with left sided back pain that radiates around her buttock and lateral thigh. Pain began to worsen about 1 week ago. She has been moving and has been bending and lifting frequently. Pain is a shooting pain worse with certain positions. Worse when laying flat or on her side She has not tried anything for the pain. Denies incontinence, injury, abdominal pain or urinary symptoms. She admit to sciatica pain in the past. States this pain feels like sciatica. She also reports swelling and pins and needle sensation of her lower extremities. H/o peripheral edema and bypass . She has seen vascular for this and states she needs to call for another visit  HPI  Past Medical History:  Diagnosis Date  . Alcohol abuse   . Alcoholic cirrhosis ([place])   . Anemia   . Anxiety   . Arthritis    "knees" (11/26/2018)  . B12 deficiency   . CAD (coronary artery disease)    a. 11/10/2014 Cath: LM nl, LAD min irregs, D1 30 ost, D2 50d, LCX 41m, OM1 80 p/m (1.5 mm vessel), OM2 69m, RCA nondom 92m-->med rx.. Demand ischemia in the setting of rapid a-fib.  . Cardiomyopathy ([place])   . Carotid artery disease ([place])    a. 01/2015 Carotid Angio: RICA 409, LICA 81X; b. 07/1477 s/p L CEA; c. 05/2019 Carotid U/S: RICA 100. RECA >50. LICA 2-95%.  . Cellulitis    lower extremities  . Chronic combined systolic and diastolic CHF (congestive heart failure) ([place])    a. 10/2014 Echo: EF 40-45%; b. 10/2018 Echo: EF 45-50%, gr2 DD; c. 11/2019 Echo: EF 50%, mild LVH, gr2 DD (restrictive), antlat HK, Nl RV fxn. Mild BAE. RVSP 59.66mmHg.  . CKD (chronic kidney disease), stage III   . Cocaine abuse ([place])   . COPD (chronic obstructive pulmonary disease) ([place])   . Critical lower limb ischemia   . Depression   .  Diabetic peripheral neuropathy ([place])   . Dyspnea   . Elevated troponin    a. Chronic elevation.  [name] GERD (gastroesophageal reflux disease)   . Hyperlipemia   . Hypertension   . Hypokalemia   . Hypomagnesemia   . Hypothyroidism   . Marijuana abuse   . Narcotic abuse ([place])   . Noncompliance   . NSVT (nonsustained ventricular tachycardia) ([place])   . Obesity   . PAF (paroxysmal atrial fibrillation) ([place])   . Paroxysmal atrial tachycardia ([place])   . Peripheral arterial disease ([place])    a. 01/2015 Angio/PTA: RSFA 99 (atherectomy/pta) - 1 vessel runoff via diff dzs peroneal; b. 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  . Pneumonia    "once or twice" (11/26/2018)  . Poorly controlled type 2 diabetes mellitus ([place])   . Renal insufficiency    a. 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  [name] Sleep apnea    "couldn't handle wearing the mask" (11/26/2018)  . Symptomatic bradycardia    a. Avoid AV blocking agent per EP. Prev req temp wire in 2017.  . Tobacco abuse     Patient Active Problem List   Diagnosis Date Noted  . Hyperlipidemia 04/03/2020  . PAD (peripheral artery disease) ([place]) 12/26/2019  . Acute renal failure (  [place])   . Acute respiratory failure with hypoxia ([place]) 12/02/2019  . Acute exacerbation of CHF (congestive heart failure) ([place]) 12/01/2019  . Cellulitis in diabetic foot ([place]) 07/08/2019  . Osteomyelitis ([place]) 06/21/2019  . NICM (nonischemic cardiomyopathy) ([place]) 06/20/2019  . Non-healing ulcer ([place]) 06/20/2019  . Acute CHF (congestive heart failure) ([place]) 11/26/2018  . CHF (congestive heart failure) ([place]) 11/26/2018  . Acute respiratory failure ([place]) 10/21/2018  . AKI (acute kidney injury) ([place]) 08/18/2018  . Coagulation disorder ([place]) 08/09/2017  . Depression 07/21/2017  . At risk for adverse drug reaction 06/20/2017  . Peripheral neuropathy 06/20/2017  . Acute osteomyelitis of right foot ([place]) 06/13/2017  . S/P transmetatarsal amputation of foot, right ([place])  06/05/2017  . 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 SIRS (systemic inflammatory response syndrome) ([place]) 04/06/2017  . CKD (chronic kidney disease), stage III ([place]) 11/24/2016  . Suspected sleep apnea 11/24/2016  . Ulcer of toe of right foot, with necrosis of bone ([place]) 10/27/2016  . Ulcer of left lower leg ([place]) 05/19/2016  . Severe obesity (BMI >= 40) ([place]) 02/24/2016  . COPD GOLD 0  02/24/2016  . Encounter for therapeutic drug monitoring 02/10/2016  . Symptomatic bradycardia 01/12/2016  . Essential hypertension 12/22/2015  . Chronic combined systolic and diastolic CHF (congestive heart failure) ([place])   . Wheeze   . 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 Tobacco abuse 10/23/2015  . Coronary artery disease   . DOE (dyspnea on exertion) 04/29/2015  . Diabetes mellitus type 2, uncontrolled ([place]) 02/08/2015  . Influenza A 02/07/2015  . Wrist fracture, left, with routine healing, subsequent encounter 02/05/2015  . Wrist fracture, left, closed, initial encounter 01/29/2015  . PAF (paroxysmal atrial fibrillation) ([place]) 01/16/2015  . Carotid arterial disease ([place]) 01/16/2015  . Claudication ([place]) 01/15/2015  . Demand ischemia ([place]) 10/29/2014  . Insomnia 02/03/2014  . COPD with acute exacerbation ([place]) 02/01/2014  . S/P peripheral artery angioplasty - [place]bo[place] atherectomy; R SFA 09/11/2013    Class: Acute  . Leg pain, bilateral 08/19/2013  . Hypothyroidism 07/31/2013  . Cellulitis 06/13/2013  . History of cocaine abuse ([place]) 06/13/2013  . Long term current use of anticoagulant therapy 05/20/2013  . Alcohol abuse   . Narcotic abuse ([place])   . Marijuana abuse   . Alcoholic cirrhosis ([place])   . DM (diabetes mellitus), type 2 with peripheral vascular complications [facility])     Past Surgical History:  Procedure Laterality Date  . ABDOMINAL AORTOGRAM N/A 06/26/2019    Procedure: ABDOMINAL AORTOGRAM;  Surgeon: Wellington Hampshire, MD;  Location: [facility];  Service: Cardiovascular;  Laterality: N/A;  . AMPUTATION Right 06/14/2017   Procedure: Right foot transmetatarsal amputation;  Surgeon: Newt Minion, MD;  Location: [facility];  Service: Orthopedics;  Laterality: Right;  . AMPUTATION Left 06/28/2019   Procedure: AMPUTATION LEFT FIFTH TOE;  Surgeon: Rosetta Posner, MD;  Location: [facility];  Service: Vascular;  Laterality: Left;  . AMPUTATION TOE Right 04/28/2017   Procedure: AMPUTATION OF RIGHT SECOND RAY;  Surgeon: Newt Minion, MD;  Location: [facility];  Service: Orthopedics;  Laterality: Right;  . CARDIAC CATHETERIZATION    . CARDIAC CATHETERIZATION N/A 01/12/2016   Procedure: Temporary Wire;  Surgeon: Minus Breeding, MD;  Location: [place] [facility];  Service: Cardiovascular;  Laterality: N/A;  . CARDIOVERSION  ~ 02/2013   "twice"   . CAROTID  ANGIOGRAM N/A 01/15/2015   Procedure: CAROTID ANGIOGRAM;  Surgeon: [name], MD;  Location: [facility] CATH LAB;  Service: Cardiovascular;  Laterality: N/A;  . DILATION AND CURETTAGE OF UTERUS  1988  . ENDARTERECTOMY Left 02/19/2015   Procedure: LEFT CAROTID ENDARTERECTOMY ;  Surgeon: Serafina Mitchell, MD;  Location: [facility];  Service: Vascular;  Laterality: Left;  . FEMORAL-TIBIAL BYPASS GRAFT Left 06/28/2019   Procedure: BYPASS GRAFT LEFT LEG FEMORAL TO ANTERIOR TIBIAL ARTERY using LEFT GREATER SAPHENOUS VEIN;  Surgeon: Rosetta Posner, MD;  Location: [facility];  Service: Vascular;  Laterality: Left;  . LEFT HEART CATHETERIZATION WITH CORONARY ANGIOGRAM N/A 10/31/2014   Procedure: LEFT HEART CATHETERIZATION WITH CORONARY ANGIOGRAM;  Surgeon: Burnell Blanks, MD;  Location: [facility] CATH LAB;  Service: Cardiovascular;  Laterality: N/A;  . LOWER EXTREMITY ANGIOGRAM N/A 09/10/2013   Procedure: LOWER EXTREMITY ANGIOGRAM;  Surgeon: [name], MD;  Location[facility] LAB;  Service: Cardiovascular;  Laterality: N/A;  . LOWER  EXTREMITY ANGIOGRAM N/A 01/15/2015   Procedure: LOWER EXTREMITY ANGIOGRAM;  Surgeon: [name], MD;  Location: [facility] CATH LAB;  Service: Cardiovascular;  Laterality: N/A;  . LOWER EXTREMITY ANGIOGRAPHY N/A 04/13/2017   Procedure: Lower Extremity Angiography;  Surgeon: [name], MD;  Location: [place] [facility];  Service: Cardiovascular;  Laterality: N/A;  . LOWER EXTREMITY ANGIOGRAPHY Left 06/26/2019   Procedure: LOWER EXTREMITY ANGIOGRAPHY;  Surgeon: Wellington Hampshire, MD;  Location: [place] [facility];  Service: Cardiovascular;  Laterality: Left;  . PERIPHERAL VASCULAR BALLOON ANGIOPLASTY Left 06/26/2019   Procedure: PERIPHERAL VASCULAR BALLOON ANGIOPLASTY;  Surgeon: Wellington Hampshire, MD;  Location: [place] [facility];  Service: Cardiovascular;  Laterality: Left;  unable to cross lt sfa occlusion  . PERIPHERAL VASCULAR INTERVENTION  04/13/2017   Procedure: Peripheral Vascular Intervention;  Surgeon: [name], MD;  Location: [facility] [facility];  Service: Cardiovascular;;  . PRESSURE SENSOR/CARDIOMEMS N/A 02/05/2020   Procedure: PRESSURE SENSOR/CARDIOMEMS;  Surgeon: Larey Dresser, MD;  Location: [place] [facility];  Service: Cardiovascular;  Laterality: N/A;  . VEIN HARVEST Left 06/28/2019   Procedure: LEFT LEG GREATER SAPHENOUS VEIN HARVEST;  Surgeon: Rosetta Posner, MD;  Location[facility];  Service: Vascular;  Laterality: Left;    OB History    Gravida  1   Para  1   Term  1   Preterm      AB      Living  1     SAB      TAB      Ectopic      Multiple      Live Births               Home Medications    Prior to Admission medications   Medication Sig Start Date End Date Taking? Authorizing Provider  ACCU-CHEK GUIDE test strip USE TO CHECK BLOOD SUGAR FOUR TIMES DAILY 02/25/20   [provider]  acetaminophen (TYLENOL) 500 MG tablet Take 2 tablets (1,000 mg total) by mouth every 6 (six) hours as needed for mild pain. 07/08/19   Desiree Hane,  MD  albuterol (VENTOLIN HFA) 108 (90 Base) MCG/ACT inhaler Inhale 2 puffs into the lungs every 6 (six) hours as needed for wheezing or shortness of breath. 07/08/19   Oretha Milch D, MD  amiodarone (PACERONE) 200 MG tablet Take 0.5 tablets (100 mg total) by mouth daily. 04/28/20   Larey Dresser, MD  amLODipine [facility])  10 MG tablet Take 1 tablet (10 mg total) by mouth daily. 02/21/20   Larey Dresser, MD  apixaban (ELIQUIS) 5 MG TABS tablet Take 1 tablet (5 mg total) by mouth 2 (two) times daily. 02/21/20   Larey Dresser, MD  Aspirin-Caffeine [facility] FAST PAIN RELIEF PO) Take 1 packet by mouth daily as needed (pain).    [provider]  atorvastatin (LIPITOR) 40 MG tablet TAKE 1 TABLET BY MOUTH ONCE EVERY EVENING 02/21/20   Larey Dresser, MD  bisoprolol (ZEBETA) 5 MG tablet Take 1 tablet (5 mg total) by mouth daily. 02/21/20   Larey Dresser, MD  budesonide-formoterol [facility]) 160-4.5 MCG/ACT inhaler Inhale 2 puffs into the lungs 2 (two) times daily. Patient taking differently: Inhale 2 puffs into the lungs 2 (two) times daily as needed (shortness of breath).  12/09/19   Eugenie Filler, MD  cholecalciferol (VITAMIN D) 1000 units tablet Take 1 tablet (1,000 Units total) by mouth daily. 07/08/19   Oretha Milch D, MD  clopidogrel (PLAVIX) 75 MG tablet Take 1 tablet (75 mg total) by mouth daily. 02/21/20   Larey Dresser, MD  colchicine 0.6 MG tablet Take 1-2 tablets (0.6-1.2 mg total) by mouth See admin instructions. Take 1.2mg  by mouth initially at onset of gout flare up, then take 0.6mg  in one hour, then stop. 03/02/20   Clegg, [name] D, NP  cyclobenzaprine (FLEXERIL) 10 MG tablet Take 1 tablet (10 mg total) by mouth 2 (two) times daily as needed for muscle spasms. 05/02/20   Domingo Dimes, PA-C  empagliflozin (JARDIANCE) 10 MG TABS tablet Take 10 mg by mouth daily before breakfast. 02/21/20   Larey Dresser, MD  folic acid (FOLVITE) 1 MG tablet Take 1 tablet (1 mg total) by mouth  daily. 07/08/19   Desiree Hane, MD  Insulin Glargine (LANTUS SOLOSTAR) 100 UNIT/ML Solostar Pen Inject 40 Units into the skin at bedtime. 12/09/19   Eugenie Filler, MD  Insulin Lispro (HUMALOG KWIKPEN) 200 UNIT/ML SOPN Inject 8 Units into the skin 3 (three) times daily with meals. Patient taking differently: Inject 8-15 Units into the skin 3 (three) times daily with meals.  07/08/19   Desiree Hane, MD  levothyroxine (SYNTHROID, LEVOTHROID) 50 MCG tablet Take 1 tablet (50 mcg total) by mouth daily. 08/09/17   Medina-Vargas, Monina C, NP  Multiple Vitamin (MULTIVITAMIN WITH MINERALS) TABS tablet Take 1 tablet by mouth daily. 10/25/18   Kayleen Memos, DO  nortriptyline (PAMELOR) 50 MG capsule Take 50 mg by mouth at bedtime. 11/28/19   [provider]  pantoprazole (PROTONIX) 40 MG tablet Take 1 tablet (40 mg total) by mouth daily. Patient not taking: Reported on 04/03/2020 02/21/20   Larey Dresser, MD  potassium chloride SA (KLOR-CON) 20 MEQ tablet Take 1 tablet (20 mEq total) by mouth daily. Patient not taking: Reported on 04/03/2020 01/28/20   Larey Dresser, MD  senna-docusate (SENOKOT-S) 8.6-50 MG tablet Take 2 tablets by mouth at bedtime as needed for mild constipation. Patient not taking: Reported on 04/03/2020 07/08/19   Oretha Milch D, MD  spironolactone (ALDACTONE) 25 MG tablet Take 1 tablet (25 mg total) by mouth daily. 02/21/20   Larey Dresser, MD  tiotropium (SPIRIVA HANDIHALER) 18 MCG inhalation capsule Place 1 capsule (18 mcg total) into inhaler and inhale every morning. Patient not taking: Reported on 04/03/2020 12/09/19   Eugenie Filler, MD  torsemide (DEMADEX) 20 MG tablet Take 2 tablets (40 mg  total) by mouth 2 (two) times daily. 03/03/20   Clegg, [name] D, NP  traMADol (ULTRAM) 50 MG tablet Take 1 tablet (50 mg total) by mouth every 6 (six) hours as needed. 05/02/20   Domingo Dimes, PA-C    Family History Family History  Problem Relation Age of Onset  .  Hypertension Mother   . Diabetes Mother   . Cancer Mother        breast, ovarian, colon  . Clotting disorder Mother   . Heart disease Mother   . Heart attack Mother   . Breast cancer Mother        in 76's  . Hypertension Father   . Heart disease Father   . Emphysema Sister        smoked    Social History Social History   Tobacco Use  . Smoking status: Current Some Day Smoker    Packs/day: 1.00    Years: 44.00    Pack years: 44.00    Types: E-cigarettes, Cigarettes  . Smokeless tobacco: Former Systems developer    Types: Snuff  Vaping Use  . Vaping Use: Former  . Devices: 11/26/2018 "stopped months ago"  Substance Use Topics  . Alcohol use: Yes    Comment: occ  . Drug use: Not Currently    Types: "Crack" cocaine, Marijuana, Oxycodone     Allergies   Gabapentin and Lyrica [pregabalin]   Review of Systems Review of Systems  Constitutional: Negative for chills and fever.  Respiratory: Negative for shortness of breath.   Cardiovascular: Positive for leg swelling. Negative for chest pain.  Gastrointestinal: Negative for abdominal pain, nausea and vomiting.  Genitourinary: Negative for difficulty urinating and dysuria.  Musculoskeletal: Positive for arthralgias, back pain, gait problem and joint swelling.  Skin: Negative for rash and wound.  Neurological: Negative for dizziness, weakness, numbness and headaches.  All other systems reviewed and are negative.    Physical Exam Triage Vital Signs ED Triage Vitals [05/02/20 1101]  Enc Vitals Group     BP (!) 113/92     Pulse Rate 61     Resp 18     Temp 98.3 F (36.8 C)     Temp Source Oral     SpO2 98 %     Weight 205 lb (93 kg)     Height 5\' 4"  (1.626 m)     Head Circumference      Peak Flow      Pain Score 10     Pain Loc      Pain Edu?      Excl. in [place]?    No data found.  Updated Vital Signs BP (!) 113/92   Pulse 61   Temp 98.3 F (36.8 C) (Oral)   Resp 18   Ht 5\' 4"  (1.626 m)   Wt 205 lb (93 kg)   LMP  11/27/2012   SpO2 98%   BMI 35.19 kg/m   Visual Acuity Right Eye Distance:   Left Eye Distance:   Bilateral Distance:    Right Eye Near:   Left Eye Near:    Bilateral Near:     Physical Exam Constitutional:      General: She is not in acute distress.    Appearance: Normal appearance.  HENT:     Head: Normocephalic and atraumatic.     Right Ear: External ear normal.     Left Ear: External ear normal.  Eyes:     General:  Right eye: No discharge.        Left eye: No discharge.     Conjunctiva/sclera: Conjunctivae normal.  Cardiovascular:     Pulses: Normal pulses.     Heart sounds: No murmur heard.   Pulmonary:     Effort: Pulmonary effort is normal. No respiratory distress.  Abdominal:     General: Abdomen is flat. There is no distension.  Musculoskeletal:        General: Swelling present. No tenderness. Normal range of motion.     Right lower leg: Swelling present. 2+ Edema present.     Left lower leg: Swelling present. 2+ Edema present.     Comments: Dusky lower extremities but normal pedal pulses  Skin:    General: Skin is warm and dry.  Neurological:     Mental Status: She is alert.       Initial Impression / Assessment and Plan / UC Course  I have reviewed the triage vital signs and the nursing notes.  Pertinent labs & imaging results that were available during my care of the patient were reviewed by me and considered in my medical decision making (see chart for details).     Sciatica and chronic peripheral edema. Advised short course of flexeril and tramadol for back pain and recommended follow-up with her vascular surgeon for her peripheral edema.    Final Clinical Impressions(s) / UC Diagnoses   Final diagnoses:  Chronic left-sided low back pain with sciatica, sciatica laterality unspecified     Discharge Instructions     Please take medication as prescribed and follow-up with your primary care provider for chronic back pain. As well as  with your Vascular surgeon for worsening lower extremity pain.     ED Prescriptions    Medication Sig Dispense Auth. Provider   traMADol (ULTRAM) 50 MG tablet Take 1 tablet (50 mg total) by mouth every 6 (six) hours as needed. 15 tablet Sheliah Mends S, PA-C   cyclobenzaprine (FLEXERIL) 10 MG tablet Take 1 tablet (10 mg total) by mouth 2 (two) times daily as needed for muscle spasms. 20 tablet Domingo Dimes, [place]     I have reviewed the PDMP during this encounter.   Domingo Dimes, PA-C 05/02/20 1211

## 2020-05-02 NOTE — Discharge Instructions (Signed)
Please take medication as prescribed and follow-up with your primary care provider for chronic back pain. As well as with your Vascular surgeon for worsening lower extremity pain.

## 2020-05-11 ENCOUNTER — Other Ambulatory Visit (HOSPITAL_COMMUNITY): Payer: Self-pay

## 2020-05-11 DIAGNOSIS — I10 Essential (primary) hypertension: Secondary | ICD-10-CM

## 2020-05-11 MED ORDER — AMLODIPINE BESYLATE 10 MG PO TABS: 10.0000 mg | ORAL_TABLET | Freq: Every day | ORAL | 3 refills | Status: DC

## 2020-05-11 MED ORDER — CLOPIDOGREL BISULFATE 75 MG PO TABS: 75.0000 mg | ORAL_TABLET | Freq: Every day | ORAL | 3 refills | Status: DC

## 2020-05-15 ENCOUNTER — Ambulatory Visit (INDEPENDENT_AMBULATORY_CARE_PROVIDER_SITE_OTHER): Payer: Medicare Other | Admitting: Podiatry

## 2020-05-15 ENCOUNTER — Other Ambulatory Visit: Payer: Self-pay

## 2020-05-15 ENCOUNTER — Encounter: Payer: Self-pay | Admitting: Podiatry

## 2020-05-15 DIAGNOSIS — E0843 Diabetes mellitus due to underlying condition with diabetic autonomic (poly)neuropathy: Secondary | ICD-10-CM | POA: Diagnosis not present

## 2020-05-15 DIAGNOSIS — B351 Tinea unguium: Secondary | ICD-10-CM

## 2020-05-15 DIAGNOSIS — Z89431 Acquired absence of right foot: Secondary | ICD-10-CM | POA: Diagnosis not present

## 2020-05-15 DIAGNOSIS — I739 Peripheral vascular disease, unspecified: Secondary | ICD-10-CM | POA: Diagnosis not present

## 2020-05-15 DIAGNOSIS — M79676 Pain in unspecified toe(s): Secondary | ICD-10-CM

## 2020-05-15 NOTE — Progress Notes (Signed)
This patient returns to my office for at risk foot care.  This patient requires this care by a professional since this patient will be at risk due to having  PAD, coagulation defect, and chronic kidney disease.  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  Patient is taking plavix.  This patient is unable to cut nails herself since the patient cannot reach her nails.These nails on her left foot  are painful walking and wearing shoes.  This patient presents for at risk foot care today.  General Appearance  Alert, conversant and in no acute stress.  Vascular  Dorsalis pedis and posterior tibial  pulses are not  palpable  bilaterally.  Capillary return is diminished   bilaterally. Cold feet  bilaterally.  Neurologic  Senn-Weinstein monofilament wire test diminished.  bilaterally. Muscle power within normal limits bilaterally.  Nails Thick disfigured discolored nails with subungual debris  1-4 left foot.  . No evidence of bacterial infection or drainage bilaterally.  Orthopedic  No limitations of motion  feet .  No crepitus or effusions noted.  No bony pathology or digital deformities noted. TMA right foot.  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  No signs of infections or ulcers noted.     Onychomycosis    Pain in left toes  Consent was obtained for treatment procedures.   Mechanical debridement of nails 1-4  Left foot. performed with a nail nipper.  Filed with dremel without incident. Patient is to schedule with Liliane Channel for new diabetic shoes.     Return office visit  3 months                    Told patient to return for periodic foot care and evaluation due to potential at risk complications.   Gardiner Barefoot DPM

## 2020-05-28 ENCOUNTER — Other Ambulatory Visit: Payer: Self-pay

## 2020-05-28 ENCOUNTER — Emergency Department (HOSPITAL_COMMUNITY)
Admission: EM | Admit: 2020-05-28 | Discharge: 2020-05-29 | Disposition: A | Discharge status: Home / Self Care | Payer: Medicare Other | Attending: Emergency Medicine | Admitting: Emergency Medicine

## 2020-05-28 ENCOUNTER — Encounter (HOSPITAL_COMMUNITY): Payer: Self-pay | Admitting: Emergency Medicine

## 2020-05-28 DIAGNOSIS — M79605 Pain in left leg: Secondary | ICD-10-CM

## 2020-05-28 DIAGNOSIS — E039 Hypothyroidism, unspecified: Secondary | ICD-10-CM | POA: Diagnosis not present

## 2020-05-28 DIAGNOSIS — Z794 Long term (current) use of insulin: Secondary | ICD-10-CM | POA: Diagnosis not present

## 2020-05-28 DIAGNOSIS — Z7982 Long term (current) use of aspirin: Secondary | ICD-10-CM | POA: Diagnosis not present

## 2020-05-28 DIAGNOSIS — Z87891 Personal history of nicotine dependence: Secondary | ICD-10-CM | POA: Insufficient documentation

## 2020-05-28 DIAGNOSIS — I2510 Atherosclerotic heart disease of native coronary artery without angina pectoris: Secondary | ICD-10-CM | POA: Diagnosis not present

## 2020-05-28 DIAGNOSIS — I509 Heart failure, unspecified: Secondary | ICD-10-CM | POA: Diagnosis not present

## 2020-05-28 DIAGNOSIS — M79604 Pain in right leg: Secondary | ICD-10-CM | POA: Insufficient documentation

## 2020-05-28 DIAGNOSIS — E1122 Type 2 diabetes mellitus with diabetic chronic kidney disease: Secondary | ICD-10-CM | POA: Insufficient documentation

## 2020-05-28 DIAGNOSIS — E1140 Type 2 diabetes mellitus with diabetic neuropathy, unspecified: Secondary | ICD-10-CM | POA: Insufficient documentation

## 2020-05-28 DIAGNOSIS — J449 Chronic obstructive pulmonary disease, unspecified: Secondary | ICD-10-CM | POA: Diagnosis not present

## 2020-05-28 DIAGNOSIS — N183 Chronic kidney disease, stage 3 unspecified: Secondary | ICD-10-CM | POA: Diagnosis not present

## 2020-05-28 DIAGNOSIS — Z79899 Other long term (current) drug therapy: Secondary | ICD-10-CM | POA: Diagnosis not present

## 2020-05-28 DIAGNOSIS — I130 Hypertensive heart and chronic kidney disease with heart failure and stage 1 through stage 4 chronic kidney disease, or unspecified chronic kidney disease: Secondary | ICD-10-CM | POA: Insufficient documentation

## 2020-05-28 MED ORDER — ACETAMINOPHEN 500 MG PO TABS
1000.0000 mg | ORAL_TABLET | Freq: Once | ORAL | Status: AC
  Administered 2020-05-28: 1000 mg via ORAL
  Filled 2020-05-28: qty 2

## 2020-05-28 NOTE — ED Triage Notes (Addendum)
Patient here via EMS, from home.  Patient saw MD about 5 days ago and was given a shot that made her legt hip pain feel better for the last two days, now her pain is back.  It is radiating into her back, buttock, leg on the left.

## 2020-05-29 MED ORDER — TRAMADOL HCL 50 MG PO TABS
50.0000 mg | ORAL_TABLET | Freq: Once | ORAL | Status: AC
  Administered 2020-05-29: 50 mg via ORAL
  Filled 2020-05-29: qty 1

## 2020-05-29 NOTE — ED Provider Notes (Signed)
[facility] EMERGENCY DEPARTMENT Provider Note  CSN: [id number] Arrival date & time: 05/28/20 2306  Chief Complaint(s) Back Pain and Leg Pain  HPI [name] is a 59 y.o. female   CC: left leg pain  Onset/Duration: several months Timing: constant and fluctuating Location: left buttock radiating down the leg Quality: aching, shooting Severity: moderate to severe Modifying Factors:  Improved by: She reports that she had a shot in her left buttock by her PCP that did relieve the pain however it only lasted a few days.  Worsened by: Ambulation and range of motion Associated Signs/Symptoms:  Pertinent (+): None  Pertinent (-): Recent falls or trauma, numbness or tingling.  Bladder/bowel incontinence.  Weakness.  Redness or worsening of baseline swelling. Context:    HPI  Past Medical History Past Medical History:  Diagnosis Date  . Alcohol abuse   . Alcoholic cirrhosis ([place])   . Anemia   . Anxiety   . Arthritis    "knees" (11/26/2018)  . B12 deficiency   . CAD (coronary artery disease)    a. 11/10/2014 Cath: LM nl, LAD min irregs, D1 30 ost, D2 50d, LCX 66m, OM1 80 p/m (1.5 mm vessel), OM2 33m, RCA nondom 30m-->med rx.. Demand ischemia in the setting of rapid a-fib.  . Cardiomyopathy ([place])   . Carotid artery disease ([place])    a. 01/2015 Carotid Angio: RICA 250, LICA 53Z; b. 05/6733 s/p L CEA; c. 05/2019 Carotid U/S: RICA 100. RECA >50. LICA 1-93%.  . Cellulitis    lower extremities  . Chronic combined systolic and diastolic CHF (congestive heart failure) ([place])    a. 10/2014 Echo: EF 40-45%; b. 10/2018 Echo: EF 45-50%, gr2 DD; c. 11/2019 Echo: EF 50%, mild LVH, gr2 DD (restrictive), antlat HK, Nl RV fxn. Mild BAE. RVSP 59.41mmHg.  . CKD (chronic kidney disease), stage III   . Cocaine abuse ([place])   . COPD (chronic obstructive pulmonary disease) ([place])   . Critical lower limb ischemia   . Depression   . Diabetic peripheral neuropathy ([place])   . Dyspnea   .  Elevated troponin    a. Chronic elevation.  [name] GERD (gastroesophageal reflux disease)   . Hyperlipemia   . Hypertension   . Hypokalemia   . Hypomagnesemia   . Hypothyroidism   . Marijuana abuse   . Narcotic abuse ([place])   . Noncompliance   . NSVT (nonsustained ventricular tachycardia) ([place])   . Obesity   . PAF (paroxysmal atrial fibrillation) ([place])   . Paroxysmal atrial tachycardia ([place])   . Peripheral arterial disease ([name])    a. 01/2015 Angio/PTA: RSFA 99 (atherectomy/pta) - 1 vessel runoff via diff dzs peroneal; b. 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  . Pneumonia    "once or twice" (11/26/2018)  . Poorly controlled type 2 diabetes mellitus ([place])   . Renal insufficiency    a. 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  [name] Sleep apnea    "couldn't handle wearing the mask" (11/26/2018)  . Symptomatic bradycardia    a. Avoid AV blocking agent per EP. Prev req temp wire in 2017.  . Tobacco abuse    Patient Active Problem List   Diagnosis Date Noted  . Hyperlipidemia 04/03/2020  . PAD (peripheral artery disease) ([place]) 12/26/2019  . Acute renal failure ([place])   . Acute respiratory failure with hypoxia ([place]) 12/02/2019  . Acute exacerbation of CHF (congestive heart failure) ([place]) 12/01/2019  . Cellulitis in diabetic  foot ([place]) 07/08/2019  . Osteomyelitis ([place]) 06/21/2019  . NICM (nonischemic cardiomyopathy) ([place]) 06/20/2019  . Non-healing ulcer ([place]) 06/20/2019  . Acute CHF (congestive heart failure) ([place]) 11/26/2018  . CHF (congestive heart failure) ([place]) 11/26/2018  . Acute respiratory failure ([place]) 10/21/2018  . AKI (acute kidney injury) ([place]) 08/18/2018  . Coagulation disorder ([place]) 08/09/2017  . Depression 07/21/2017  . At risk for adverse drug reaction 06/20/2017  . Peripheral neuropathy 06/20/2017  . Acute osteomyelitis of right foot ([place]) 06/13/2017  . S/P transmetatarsal amputation of foot, right ([place]) 06/05/2017  . 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 SIRS (systemic inflammatory response syndrome) ([place]) 04/06/2017  . CKD (chronic kidney disease), stage III ([place]) 11/24/2016  . Suspected sleep apnea 11/24/2016  . Ulcer of toe of right foot, with necrosis of bone ([place]) 10/27/2016  . Ulcer of left lower leg ([place]) 05/19/2016  . Severe obesity (BMI >= 40) ([place]) 02/24/2016  . COPD GOLD 0  02/24/2016  . Encounter for therapeutic drug monitoring 02/10/2016  . Symptomatic bradycardia 01/12/2016  . Essential hypertension 12/22/2015  . Chronic combined systolic and diastolic CHF (congestive heart failure) ([place])   . Wheeze   . 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 Tobacco abuse 10/23/2015  . Coronary artery disease   . DOE (dyspnea on exertion) 04/29/2015  . Diabetes mellitus type 2, uncontrolled ([place]) 02/08/2015  . Influenza A 02/07/2015  . Wrist fracture, left, with routine healing, subsequent encounter 02/05/2015  . Wrist fracture, left, closed, initial encounter 01/29/2015  . PAF (paroxysmal atrial fibrillation) ([place]) 01/16/2015  . Carotid arterial disease ([place]) 01/16/2015  . Claudication ([place]) 01/15/2015  . Demand ischemia ([place]) 10/29/2014  . Insomnia 02/03/2014  . COPD with acute exacerbation ([place]) 02/01/2014  . S/P peripheral artery angioplasty - [place]bo[place] atherectomy; R SFA 09/11/2013    Class: Acute  . Leg pain, bilateral 08/19/2013  . Hypothyroidism 07/31/2013  . Cellulitis 06/13/2013  . History of cocaine abuse ([place]) 06/13/2013  . Long term current use of anticoagulant therapy 05/20/2013  . Alcohol abuse   . Narcotic abuse ([place])   . Marijuana abuse   . Alcoholic cirrhosis ([place])   . DM (diabetes mellitus), type 2 with peripheral vascular complications ([place])    Home Medication(s) Prior to Admission medications   Medication Sig Start Date End Date Taking? Authorizing Provider  ACCU-CHEK GUIDE test strip USE TO CHECK  BLOOD SUGAR FOUR TIMES DAILY 02/25/20   [provider]  acetaminophen (TYLENOL) 500 MG tablet Take 2 tablets (1,000 mg total) by mouth every 6 (six) hours as needed for mild pain. 07/08/19   Desiree Hane, MD  albuterol (VENTOLIN HFA) 108 (90 Base) MCG/ACT inhaler Inhale 2 puffs into the lungs every 6 (six) hours as needed for wheezing or shortness of breath. 07/08/19   Oretha Milch D, MD  amiodarone (PACERONE) 200 MG tablet Take 0.5 tablets (100 mg total) by mouth daily. 04/28/20   Larey Dresser, MD  amLODipine (NORVASC) 10 MG tablet Take 1 tablet (10 mg total) by mouth daily. 05/11/20   Larey Dresser, MD  apixaban (ELIQUIS) 5 MG TABS tablet Take 1 tablet (5 mg total) by mouth 2 (two) times daily. 02/21/20   Larey Dresser, MD  Aspirin-Caffeine [facility] FAST PAIN RELIEF PO) Take 1 packet by mouth daily as needed (pain).    [provider]  atorvastatin (LIPITOR) 40 MG  tablet TAKE 1 TABLET BY MOUTH ONCE EVERY EVENING 02/21/20   Larey Dresser, MD  bisoprolol (ZEBETA) 5 MG tablet Take 1 tablet (5 mg total) by mouth daily. 02/21/20   Larey Dresser, MD  budesonide-formoterol [facility]) 160-4.5 MCG/ACT inhaler Inhale 2 puffs into the lungs 2 (two) times daily. Patient taking differently: Inhale 2 puffs into the lungs 2 (two) times daily as needed (shortness of breath).  12/09/19   Eugenie Filler, MD  cholecalciferol (VITAMIN D) 1000 units tablet Take 1 tablet (1,000 Units total) by mouth daily. 07/08/19   Oretha Milch D, MD  clopidogrel (PLAVIX) 75 MG tablet Take 1 tablet (75 mg total) by mouth daily. 05/11/20   Larey Dresser, MD  colchicine 0.6 MG tablet Take 1-2 tablets (0.6-1.2 mg total) by mouth See admin instructions. Take 1.2mg  by mouth initially at onset of gout flare up, then take 0.6mg  in one hour, then stop. 03/02/20   Clegg, [name] D, NP  cyclobenzaprine (FLEXERIL) 10 MG tablet Take 1 tablet (10 mg total) by mouth 2 (two) times daily as needed for muscle spasms. 05/02/20    Domingo Dimes, PA-C  empagliflozin (JARDIANCE) 10 MG TABS tablet Take 10 mg by mouth daily before breakfast. 02/21/20   Larey Dresser, MD  folic acid (FOLVITE) 1 MG tablet Take 1 tablet (1 mg total) by mouth daily. 07/08/19   Desiree Hane, MD  Insulin Glargine (LANTUS SOLOSTAR) 100 UNIT/ML Solostar Pen Inject 40 Units into the skin at bedtime. 12/09/19   Eugenie Filler, MD  Insulin Lispro (HUMALOG KWIKPEN) 200 UNIT/ML SOPN Inject 8 Units into the skin 3 (three) times daily with meals. Patient taking differently: Inject 8-15 Units into the skin 3 (three) times daily with meals.  07/08/19   Desiree Hane, MD  levothyroxine (SYNTHROID, LEVOTHROID) 50 MCG tablet Take 1 tablet (50 mcg total) by mouth daily. 08/09/17   Medina-Vargas, Monina C, NP  Multiple Vitamin (MULTIVITAMIN WITH MINERALS) TABS tablet Take 1 tablet by mouth daily. 10/25/18   Kayleen Memos, DO  nortriptyline (PAMELOR) 50 MG capsule Take 50 mg by mouth at bedtime. 11/28/19   [provider]  pantoprazole (PROTONIX) 40 MG tablet Take 1 tablet (40 mg total) by mouth daily. Patient not taking: Reported on 04/03/2020 02/21/20   Larey Dresser, MD  potassium chloride SA (KLOR-CON) 20 MEQ tablet Take 1 tablet (20 mEq total) by mouth daily. Patient not taking: Reported on 04/03/2020 01/28/20   Larey Dresser, MD  senna-docusate (SENOKOT-S) 8.6-50 MG tablet Take 2 tablets by mouth at bedtime as needed for mild constipation. Patient not taking: Reported on 04/03/2020 07/08/19   Oretha Milch D, MD  spironolactone (ALDACTONE) 25 MG tablet Take 1 tablet (25 mg total) by mouth daily. 02/21/20   Larey Dresser, MD  tiotropium (SPIRIVA HANDIHALER) 18 MCG inhalation capsule Place 1 capsule (18 mcg total) into inhaler and inhale every morning. Patient not taking: Reported on 04/03/2020 12/09/19   Eugenie Filler, MD  torsemide (DEMADEX) 20 MG tablet Take 2 tablets (40 mg total) by mouth 2 (two) times daily. 03/03/20   Clegg, [name] D,  NP  traMADol (ULTRAM) 50 MG tablet Take 1 tablet (50 mg total) by mouth every 6 (six) hours as needed. 05/02/20   Domingo Dimes, PA-C  Past Surgical History Past Surgical History:  Procedure Laterality Date  . ABDOMINAL AORTOGRAM N/A 06/26/2019   Procedure: ABDOMINAL AORTOGRAM;  Surgeon: Wellington Hampshire, MD;  Location: [place] CV [facility];  Service: Cardiovascular;  Laterality: N/A;  . AMPUTATION Right 06/14/2017   Procedure: Right foot transmetatarsal amputation;  Surgeon: Newt Minion, MD;  Location: [place];  Service: Orthopedics;  Laterality: Right;  . AMPUTATION Left 06/28/2019   Procedure: AMPUTATION LEFT FIFTH TOE;  Surgeon: Rosetta Posner, MD;  Location: [place];  Service: Vascular;  Laterality: Left;  . AMPUTATION TOE Right 04/28/2017   Procedure: AMPUTATION OF RIGHT SECOND RAY;  Surgeon: Newt Minion, MD;  Location: [place];  Service: Orthopedics;  Laterality: Right;  . CARDIAC CATHETERIZATION    . CARDIAC CATHETERIZATION N/A 01/12/2016   Procedure: Temporary Wire;  Surgeon: Minus Breeding, MD;  Location: [place] CV LAB;  Service: Cardiovascular;  Laterality: N/A;  . CARDIOVERSION  ~ 02/2013   "twice"   . CAROTID ANGIOGRAM N/A 01/15/2015   Procedure: CAROTID ANGIOGRAM;  Surgeon: [name], MD;  Location[facility] CATH LAB;  Service: Cardiovascular;  Laterality: N/A;  . DILATION AND CURETTAGE OF UTERUS  1988  . ENDARTERECTOMY Left 02/19/2015   Procedure: LEFT CAROTID ENDARTERECTOMY ;  Surgeon: Serafina Mitchell, MD;  Location: [place];  Service: Vascular;  Laterality: Left;  . FEMORAL-TIBIAL BYPASS GRAFT Left 06/28/2019   Procedure: BYPASS GRAFT LEFT LEG FEMORAL TO ANTERIOR TIBIAL ARTERY using LEFT GREATER SAPHENOUS VEIN;  Surgeon: Rosetta Posner, MD;  Location: [place];  Service: Vascular;  Laterality: Left;  . LEFT HEART CATHETERIZATION WITH CORONARY ANGIOGRAM N/A  10/31/2014   Procedure: LEFT HEART CATHETERIZATION WITH CORONARY ANGIOGRAM;  Surgeon: Burnell Blanks, MD;  Location: [facility] CATH LAB;  Service: Cardiovascular;  Laterality: N/A;  . LOWER EXTREMITY ANGIOGRAM N/A 09/10/2013   Procedure: LOWER EXTREMITY ANGIOGRAM;  Surgeon: [name], MD;  Location: [facility] CATH LAB;  Service: Cardiovascular;  Laterality: N/A;  . LOWER EXTREMITY ANGIOGRAM N/A 01/15/2015   Procedure: LOWER EXTREMITY ANGIOGRAM;  Surgeon: [name], MD;  Location: [facility] CATH LAB;  Service: Cardiovascular;  Laterality: N/A;  . LOWER EXTREMITY ANGIOGRAPHY N/A 04/13/2017   Procedure: Lower Extremity Angiography;  Surgeon: [name], MD;  Location: [place] [facility];  Service: Cardiovascular;  Laterality: N/A;  . LOWER EXTREMITY ANGIOGRAPHY Left 06/26/2019   Procedure: LOWER EXTREMITY ANGIOGRAPHY;  Surgeon: Wellington Hampshire, MD;  Location: [place] [facility];  Service: Cardiovascular;  Laterality: Left;  . PERIPHERAL VASCULAR BALLOON ANGIOPLASTY Left 06/26/2019   Procedure: PERIPHERAL VASCULAR BALLOON ANGIOPLASTY;  Surgeon: Wellington Hampshire, MD;  Location: [place] CV LAB;  Service: Cardiovascular;  Laterality: Left;  unable to cross lt sfa occlusion  . PERIPHERAL VASCULAR INTERVENTION  04/13/2017   Procedure: Peripheral Vascular Intervention;  Surgeon: [name], MD;  Location: [place] CV LAB;  Service: Cardiovascular;;  . PRESSURE SENSOR/CARDIOMEMS N/A 02/05/2020   Procedure: PRESSURE SENSOR/CARDIOMEMS;  Surgeon: Larey Dresser, MD;  Location: [place] CV LAB;  Service: Cardiovascular;  Laterality: N/A;  . VEIN HARVEST Left 06/28/2019   Procedure: LEFT LEG GREATER SAPHENOUS VEIN HARVEST;  Surgeon: Rosetta Posner, MD;  Location: [facility] OR;  Service: Vascular;  Laterality: Left;   Family History Family History  Problem Relation Age of Onset  . Hypertension Mother   . Diabetes Mother   . Cancer Mother        breast, ovarian, colon  . Clotting  disorder  Mother   . Heart disease Mother   . Heart attack Mother   . Breast cancer Mother        in 75's  . Hypertension Father   . Heart disease Father   . Emphysema Sister        smoked    Social History Social History   Tobacco Use  . Smoking status: Current Some Day Smoker    Packs/day: 1.00    Years: 44.00    Pack years: 44.00    Types: E-cigarettes, Cigarettes  . Smokeless tobacco: Former Systems developer    Types: Snuff  Vaping Use  . Vaping Use: Former  . Devices: 11/26/2018 "stopped months ago"  Substance Use Topics  . Alcohol use: Yes    Comment: occ  . Drug use: Not Currently    Types: "Crack" cocaine, Marijuana, Oxycodone   Allergies Gabapentin and Lyrica [pregabalin]  Review of Systems Review of Systems All other systems are reviewed and are negative for acute change except as noted in the HPI  Physical Exam Vital Signs  I have reviewed the triage vital signs BP 119/70 (BP Location: Right Arm)   Pulse 79   Temp 98.4 F (36.9 C) (Oral)   Resp 18   Ht 5\' 4"  (1.626 m)   Wt 92.5 kg   LMP 11/27/2012   SpO2 96%   BMI 35.02 kg/m   Physical Exam Vitals reviewed.  Constitutional:      General: She is not in acute distress.    Appearance: She is well-developed. She is not diaphoretic.     Comments: Found sleeping comfortably in bed.    HENT:     Head: Normocephalic and atraumatic.     Right Ear: External ear normal.     Left Ear: External ear normal.     Nose: Nose normal.  Eyes:     General: No scleral icterus.    Conjunctiva/sclera: Conjunctivae normal.  Neck:     Trachea: Phonation normal.  Cardiovascular:     Rate and Rhythm: Normal rate and regular rhythm.  Pulmonary:     Effort: Pulmonary effort is normal. No respiratory distress.     Breath sounds: No stridor.  Abdominal:     General: There is no distension.  Musculoskeletal:        General: Normal range of motion.     Cervical back: Normal range of motion.     Thoracic back: No spasms. No scoliosis.      Lumbar back: No spasms, tenderness or bony tenderness.     Right hip: Normal strength.     Left hip: Normal strength.     Right foot: Normal pulse.     Left foot: Normal pulse.       Legs:  Neurological:     Mental Status: She is alert and oriented to person, place, and time.  Psychiatric:        Behavior: Behavior normal.     ED Results and Treatments Labs (all labs ordered are listed, but only abnormal results are displayed) Labs Reviewed - No data to display  EKG  EKG Interpretation  Date/Time:    Ventricular Rate:    PR Interval:    QRS Duration:   QT Interval:    QTC Calculation:   R Axis:     Text Interpretation:        Radiology No results found.  Pertinent labs & imaging results that were available during my care of the patient were reviewed by me and considered in my medical decision making (see chart for details).  Medications Ordered in ED Medications  acetaminophen (TYLENOL) tablet 1,000 mg (1,000 mg Oral Given by Other 05/28/20 2330)  traMADol (ULTRAM) tablet 50 mg (50 mg Oral Given 05/29/20 0521)                                                                                                                                    Procedures Procedures  (including critical care time)  Medical Decision Making / ED Course I have reviewed the nursing notes for this encounter and the patient's prior records (if available in EHR or on provided paperwork).   [name] was evaluated in Emergency Department on 05/29/2020 for the symptoms described in the history of present illness. She was evaluated in the context of the global COVID-19 pandemic, which necessitated consideration that the patient might be at risk for infection with the SARS-CoV-2 virus that causes COVID-19. Institutional protocols and algorithms that pertain to the evaluation of  patients at risk for COVID-19 are in a state of rapid change based on information released by regulatory bodies including the CDC and federal and state organizations. These policies and algorithms were followed during the patient's care in the ED.  Consistent with her known sciatica w/o acute worsening. Patient has intact pulses, doubt arterial occlusion.  Low suspicion for DVT. No acute injuries requiring imaging. No evidence suggesting cauda equina. Provided with single dose of tramadol.  Recommend she follow-up closely with her primary care provider for further management.      Final Clinical Impression(s) / ED Diagnoses Final diagnoses:  Left leg pain    The patient appears reasonably screened and/or stabilized for discharge and I doubt any other medical condition or other [facility] requiring further screening, evaluation, or treatment in the ED at this time prior to discharge. Safe for discharge with strict return precautions.  Disposition: Discharge  Condition: Good  I have discussed the results, Dx and Tx plan with the patient/family who expressed understanding and agree(s) with the plan. Discharge instructions discussed at length. The patient/family was given strict return precautions who verbalized understanding of the instructions. No further questions at time of discharge.    ED Discharge Orders    None        Follow Up: Loretha Brasil, [place] [id number] [phone]  Call today If symptoms do not improve or  worsen     This chart was dictated using voice  recognition software.  Despite best efforts to proofread,  errors can occur which can change the documentation meaning.   Fatima Blank, MD 05/29/20 [phone]

## 2020-06-01 ENCOUNTER — Other Ambulatory Visit (HOSPITAL_COMMUNITY): Payer: Self-pay | Admitting: Cardiovascular Disease

## 2020-06-01 ENCOUNTER — Other Ambulatory Visit: Payer: Self-pay

## 2020-06-01 ENCOUNTER — Encounter (HOSPITAL_COMMUNITY): Payer: Medicare Other

## 2020-06-01 ENCOUNTER — Ambulatory Visit (HOSPITAL_COMMUNITY)
Admission: RE | Admit: 2020-06-01 | Discharge: 2020-06-01 | Disposition: A | Discharge status: Home / Self Care | Payer: Medicare Other | Source: Ambulatory Visit | Attending: Internal Medicine | Admitting: Internal Medicine

## 2020-06-01 DIAGNOSIS — M545 Low back pain, unspecified: Secondary | ICD-10-CM | POA: Insufficient documentation

## 2020-06-01 DIAGNOSIS — I6521 Occlusion and stenosis of right carotid artery: Secondary | ICD-10-CM | POA: Diagnosis present

## 2020-06-01 DIAGNOSIS — Z9889 Other specified postprocedural states: Secondary | ICD-10-CM

## 2020-06-01 DIAGNOSIS — I6523 Occlusion and stenosis of bilateral carotid arteries: Secondary | ICD-10-CM

## 2020-06-02 ENCOUNTER — Other Ambulatory Visit: Payer: Self-pay

## 2020-06-02 DIAGNOSIS — I6521 Occlusion and stenosis of right carotid artery: Secondary | ICD-10-CM

## 2020-06-03 ENCOUNTER — Telehealth (HOSPITAL_COMMUNITY): Payer: Self-pay | Admitting: *Deleted

## 2020-06-03 NOTE — Telephone Encounter (Signed)
Pt called to report she is having issues with her hip and is sch for a MRI on Fri 7/23, since she has the Cardiomems implanted she wanted to make sure it safe to have the MRI.  Advised per [employer] Cardiomems are MRI compatible so she is fine to proceed with MRI.  Pt aware and agreeable, she request info be faxed to [facility], note faxed to them at [phone]

## 2020-06-05 ENCOUNTER — Ambulatory Visit: Payer: Medicare Other | Admitting: Orthotics

## 2020-06-05 ENCOUNTER — Other Ambulatory Visit: Payer: Self-pay

## 2020-06-05 DIAGNOSIS — B351 Tinea unguium: Secondary | ICD-10-CM

## 2020-06-05 DIAGNOSIS — Z89431 Acquired absence of right foot: Secondary | ICD-10-CM

## 2020-06-05 NOTE — Progress Notes (Signed)
Sees nP...has appointment on 7/27

## 2020-06-07 NOTE — Progress Notes (Signed)
Patient ID: [name], female   DOB: September 05, 1961, 59 y.o.   MRN: [id number]    Advanced Heart Failure Clinic Note   PCP: Dr Roxy Manns HF Cardiology: Dr. Aundra Dubin PV: Dr. Gwenlyn Found  [name] is a 59 y.o. with history of PAD, carotid stenosis s/p left CEA, relatively mild CAD, chronic systolic CHF, paroxysmal atrial fibrillation and prior substance abuse presents for CHF clinic followup.  She was admitted in 1/17 with acute hypoxemic respiratory failure in the setting of atrial fibrillation/RVR and volume overload.  She was initially intubated.  She converted back to NSR with amiodarone gtt.  She was treated with IV Lasix, steroids, bronchodilators.  She developed AKI and losartan was stopped.    She was admitted in 3/17 with symptomatic bradycardia.  She was supposed to stop diltiazem after this admission but continued to take it.  She presented later in 3/17, again with symptomatic bradycardia (junctional bradycardia), hypotension, and AKI.  Diltiazem and bisoprolol were stopped.  HR recovered and she has had no problems since.  ACEI was stopped with AKI.   In 5/18, she had peripheral angiography with atherectomy of right SFA.  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 7/18 ABIs showed 0.65 on right, 0.31 on left.  In 8/20, she had peripheral angiography showing totally occluded left SFA.  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 Donnetta Hutching.  ABIs in 2/21 were 0.44 on right, 0.99 on left.   Most recent echo in 1/21 showed EF 50%, mild LVH, normal RV, PASP 60 mmHg.   Today she returns for HF follow up. Says she started taking ALLI for weight loss. Complaining of foot pain. SOB with exertion. SLeeps with HOB elevated. Sleeps in a hospital bed. Denies PND/Orthopnea. Appetite ok. No fever or chills. Weight at home trending down 199-->195  pounds. Taking all medications. Smoking 1/2 PPD.   Cardiomems: Reading : 20 today   Labs (1/17): K  5.2, creatinine 1.4, AST 43, ALT normal Labs (2/17): K 4.2, creatinine 1.3, BNP 607, TSH normal Labs (3/17): K 4.2, creatinine 1.45, AST/ALT normal, HCT 28.7 Labs (4/17): K 4.4, creatinine 1.61 Labs (08/31/2016): K 4.5 Creatinine 1.43  Labs (11/17): K 4.2, creatinine 1.4 Labs (9/18): K 4.1, creatinine 1.1, TSH normal Labs 04/13/2018: K 5 Creatinine 1.75 Labs (2/21): K 4.1, creatinine 1.58 Labs (01/28/20): K 4.1 Creatinine 1.4   PMH: 1. Carotid stenosis: Known occluded right carotid.  Left CEA in 4/16.  2. CAD: LHC in 12/15 with 80% stenosis in small OM1, nonobstructive disease in other territories.  3. Chronic systolic CHF: Nonischemic cardiomyopathy (?due to ETOH or prior drug abuse).  - Echo (1/17) with EF 45%, mild LV hypertrophy, moderate diastolic dysfunction, inferolateral severe hypokinesis, mildly decreased RV systolic function.  - Echo (1/18): EF 40-45%, mild LVH, normal RV size and systolic function.  - Echo (1/21): EF 50%, mild LVH, normal RV, PASP 60 mmHg.  4. Atrial fibrillation: Paroxysmal.   5. Type II diabetes 6. CKD: Stage III. 7. COPD: Quit smoking 1/17. On home oxygen.  8. 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  No longer drinks.  9. Hypothyroidism 10. PAD: Atherectomy SFA in 2014 (Dr Gwenlyn Found).  Peripheral arterial dopplers (2/17) with focal 75-99% proximal right SFA stenosis, occluded mid-distal right SFA, chronic occlusion of all runoff arteries on right. - In 5/18, she had peripheral angiography with atherectomy of right SFA.   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  - 7/18 ABIs showed 0.65 on right, 0.31 on left.   - Peripheral angiography 8/20: Totally occluded left SFA.  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 Donnetta Hutching.   - ABIs (2/21): 0.44 R, 0.99 L.  11. Anemia 12. Prior cocaine abuse.  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  14. OSA: Has not been able  to tolerate CPAP.   SH: Lives with sister.  Prior cocaine abuse.  Prior ETOH abuse.  Quit smoking in 1/17, restarted, and quit again in 4/19.  FH: CAD  Review of systems complete and found to be negative unless listed in HPI.    Current Outpatient Medications  Medication Sig Dispense Refill  . ACCU-CHEK GUIDE test strip USE TO CHECK BLOOD SUGAR FOUR TIMES DAILY    . acetaminophen (TYLENOL) 500 MG tablet Take 2 tablets (1,000 mg total) by mouth every 6 (six) hours as needed for mild pain. 30 tablet 0  . albuterol (VENTOLIN HFA) 108 (90 Base) MCG/ACT inhaler Inhale 2 puffs into the lungs every 6 (six) hours as needed for wheezing or shortness of breath. 18 g 0  . amiodarone (PACERONE) 200 MG tablet Take 0.5 tablets (100 mg total) by mouth daily. 30 tablet 6  . amLODipine (NORVASC) 10 MG tablet Take 1 tablet (10 mg total) by mouth daily. 30 tablet 3  . apixaban (ELIQUIS) 5 MG TABS tablet Take 1 tablet (5 mg total) by mouth 2 (two) times daily. 60 tablet 3  . Aspirin-Caffeine (BC FAST PAIN RELIEF PO) Take 1 packet by mouth daily as needed (pain).    [name] atorvastatin (LIPITOR) 40 MG tablet TAKE 1 TABLET BY MOUTH ONCE EVERY EVENING 30 tablet 3  . bisoprolol (ZEBETA) 5 MG tablet Take 1 tablet (5 mg total) by mouth daily. 30 tablet 5  . budesonide-formoterol (SYMBICORT) 160-4.5 MCG/ACT inhaler Inhale 2 puffs into the lungs 2 (two) times daily. (Patient taking differently: Inhale 2 puffs into the lungs 2 (two) times daily as needed (shortness of breath). ) 1 Inhaler 2  . cholecalciferol (VITAMIN D) 1000 units tablet Take 1 tablet (1,000 Units total) by mouth daily.    . clopidogrel (PLAVIX) 75 MG tablet Take 1 tablet (75 mg total) by mouth daily. 30 tablet 3  . colchicine 0.6 MG tablet Take 1-2 tablets (0.6-1.2 mg total) by mouth See admin instructions. Take 1.2mg  by mouth initially at onset of gout flare up, then take 0.6mg  in one hour, then stop. 30 tablet 3  . cyclobenzaprine (FLEXERIL) 10 MG tablet  Take 1 tablet (10 mg total) by mouth 2 (two) times daily as needed for muscle spasms. 20 tablet 0  . empagliflozin (JARDIANCE) 10 MG TABS tablet Take 10 mg by mouth daily before breakfast. 30 tablet 6  . folic acid (FOLVITE) 1 MG tablet Take 1 tablet (1 mg total) by mouth daily. 30 tablet 0  . Insulin Lispro (HUMALOG KWIKPEN) 200 UNIT/ML SOPN Inject 8 Units into the skin 3 (three) times daily with meals. (Patient taking differently: Inject 8-15 Units into the skin 3 (three) times daily with meals. )    . levothyroxine (SYNTHROID, LEVOTHROID) 50 MCG tablet Take 1 tablet (50 mcg total) by mouth daily. 30 tablet 0  . Multiple Vitamin (MULTIVITAMIN WITH MINERALS) TABS tablet Take 1 tablet by mouth daily. 30 tablet 0  . nortriptyline (PAMELOR) 50 MG capsule Take 50 mg by  mouth at bedtime.    [name] orlistat (ALLI) 60 MG capsule Take 60 mg by mouth in the morning and at bedtime.    . pantoprazole (PROTONIX) 40 MG tablet Take 1 tablet (40 mg total) by mouth daily.    . potassium chloride SA (KLOR-CON) 20 MEQ tablet Take 1 tablet (20 mEq total) by mouth daily. 90 tablet 3  . spironolactone (ALDACTONE) 25 MG tablet Take 1 tablet (25 mg total) by mouth daily. 90 tablet 3  . tiotropium (SPIRIVA HANDIHALER) 18 MCG inhalation capsule Place 1 capsule (18 mcg total) into inhaler and inhale every morning. 30 capsule 2  . torsemide (DEMADEX) 20 MG tablet Take 2 tablets (40 mg total) by mouth 2 (two) times daily. 150 tablet 3  . Insulin Glargine (LANTUS SOLOSTAR) 100 UNIT/ML Solostar Pen Inject 40 Units into the skin at bedtime.    . senna-docusate (SENOKOT-S) 8.6-50 MG tablet Take 2 tablets by mouth at bedtime as needed for mild constipation. (Patient not taking: Reported on 04/03/2020)     No current facility-administered medications for this encounter.   Vitals:   06/08/20 1113  BP: (!) 152/74  Pulse: 87  SpO2: 96%  Weight: 88.5 kg (195 lb)     Wt Readings from Last 3 Encounters:  06/08/20 88.5 kg (195 lb)    05/28/20 92.5 kg (204 lb)  05/02/20 93 kg (205 lb)    Physical Exam  General:  Arrived in a wheel whair.  HEENT: normal Neck: supple. no JVD. Carotids 2+ bilat; no bruits. No lymphadenopathy or thryomegaly appreciated. Cor: PMI nondisplaced. Regular rate & rhythm. No rubs, gallops or murmurs. Lungs: clear Abdomen: soft, nontender, nondistended. No hepatosplenomegaly. No bruits or masses. Good bowel sounds. Extremities: no cyanosis, clubbing, rash, R and LLE pale pink. No edema Neuro: alert & orientedx3, cranial nerves grossly intact. moves all 4 extremities w/o difficulty. Affect pleasant  Assessment/Plan: 1. Chronic diastolic CHF: EF 46% on last echo in 1/21. Has had presumed nonischemic given LHC in 12/15 showing only 80% stenosis in small OM1.  EF as low as 40-45% in the past.   Has Cardiomems: Reading 20--stable reading.  -NYHA III. Volume status stable. Continue torsemide 40 mg twice a day.   - Continue spironolactone 25 mg daily.  - Check BMET 2. Atrial fibrillation: Paroxysmal.  - Continue amiodarone 100 mg daily -   She is on levothyroxine, followed by PCP.  Needs regular eye exams. - Continue Eliquis 5 mg bid.  - Check CBC today.   3. PAD: Claudication bilaterally.  Not candidate for cilostazol with CHF.  She has had a left fem-pop bypass in 8/20.  ABIs in 2/21 were very abnormal on right.   - She is still on Plavix along with Eliquis. No bleeding issues.  - She is followed by Drs Gwenlyn Found and Early for PAD.  - Continue atorvastatin.  4. COPD:  Discussed smoking cessation.  5. H/o cirrhosis: Likely from ETOH, now abstinent.  6. OSA: Not able to tolerate CPAP.  - She was referred to Dr. Radford Pax to see if there is a form of CPAP that she can tolerate.  7. CKD stage III: Check BMET today.   8. NGE:XBMWUXL a little high but hasnt had meds this morning. She plans to take all meds when she gets home.  9.Type 2 diabetes: With vascular disease, CHF, and diabetes, she would be a good  candidate for SGLT2-inhibitor.  - Continue Jardiance 10 mg daily.  10. CAD: LHC in 12/15 with  80% stenosis in small OM1, nonobstructive disease in other territories. -No chest apin.  - Continue statin + SGLT2  11.  Gout No recent flare    Follow up in 3 -4 months.  Darrick Grinder, NP  06/08/2020

## 2020-06-08 ENCOUNTER — Ambulatory Visit (HOSPITAL_COMMUNITY)
Admission: RE | Admit: 2020-06-08 | Discharge: 2020-06-08 | Disposition: A | Discharge status: Home / Self Care | Payer: Medicare Other | Source: Ambulatory Visit | Attending: Cardiology | Admitting: Cardiology

## 2020-06-08 ENCOUNTER — Other Ambulatory Visit: Payer: Self-pay

## 2020-06-08 VITALS — BP 152/74 | HR 87 | Wt 195.0 lb

## 2020-06-08 DIAGNOSIS — I5022 Chronic systolic (congestive) heart failure: Secondary | ICD-10-CM | POA: Diagnosis not present

## 2020-06-08 DIAGNOSIS — I2510 Atherosclerotic heart disease of native coronary artery without angina pectoris: Secondary | ICD-10-CM | POA: Diagnosis not present

## 2020-06-08 DIAGNOSIS — Z9981 Dependence on supplemental oxygen: Secondary | ICD-10-CM | POA: Insufficient documentation

## 2020-06-08 DIAGNOSIS — Z7951 Long term (current) use of inhaled steroids: Secondary | ICD-10-CM | POA: Insufficient documentation

## 2020-06-08 DIAGNOSIS — N183 Chronic kidney disease, stage 3 unspecified: Secondary | ICD-10-CM | POA: Insufficient documentation

## 2020-06-08 DIAGNOSIS — E1122 Type 2 diabetes mellitus with diabetic chronic kidney disease: Secondary | ICD-10-CM | POA: Diagnosis not present

## 2020-06-08 DIAGNOSIS — Z720 Tobacco use: Secondary | ICD-10-CM

## 2020-06-08 DIAGNOSIS — E039 Hypothyroidism, unspecified: Secondary | ICD-10-CM | POA: Diagnosis not present

## 2020-06-08 DIAGNOSIS — I480 Paroxysmal atrial fibrillation: Secondary | ICD-10-CM | POA: Diagnosis not present

## 2020-06-08 DIAGNOSIS — J449 Chronic obstructive pulmonary disease, unspecified: Secondary | ICD-10-CM | POA: Diagnosis not present

## 2020-06-08 DIAGNOSIS — I10 Essential (primary) hypertension: Secondary | ICD-10-CM | POA: Diagnosis not present

## 2020-06-08 DIAGNOSIS — I5042 Chronic combined systolic (congestive) and diastolic (congestive) heart failure: Secondary | ICD-10-CM

## 2020-06-08 DIAGNOSIS — Z87891 Personal history of nicotine dependence: Secondary | ICD-10-CM | POA: Insufficient documentation

## 2020-06-08 DIAGNOSIS — I6521 Occlusion and stenosis of right carotid artery: Secondary | ICD-10-CM | POA: Diagnosis not present

## 2020-06-08 DIAGNOSIS — Z79890 Hormone replacement therapy (postmenopausal): Secondary | ICD-10-CM | POA: Diagnosis not present

## 2020-06-08 DIAGNOSIS — Z794 Long term (current) use of insulin: Secondary | ICD-10-CM | POA: Diagnosis not present

## 2020-06-08 DIAGNOSIS — K7460 Unspecified cirrhosis of liver: Secondary | ICD-10-CM | POA: Insufficient documentation

## 2020-06-08 DIAGNOSIS — Z7902 Long term (current) use of antithrombotics/antiplatelets: Secondary | ICD-10-CM | POA: Insufficient documentation

## 2020-06-08 DIAGNOSIS — I130 Hypertensive heart and chronic kidney disease with heart failure and stage 1 through stage 4 chronic kidney disease, or unspecified chronic kidney disease: Secondary | ICD-10-CM | POA: Insufficient documentation

## 2020-06-08 DIAGNOSIS — Z79899 Other long term (current) drug therapy: Secondary | ICD-10-CM | POA: Diagnosis not present

## 2020-06-08 DIAGNOSIS — G4733 Obstructive sleep apnea (adult) (pediatric): Secondary | ICD-10-CM | POA: Diagnosis not present

## 2020-06-08 DIAGNOSIS — E1151 Type 2 diabetes mellitus with diabetic peripheral angiopathy without gangrene: Secondary | ICD-10-CM | POA: Diagnosis not present

## 2020-06-08 DIAGNOSIS — M109 Gout, unspecified: Secondary | ICD-10-CM | POA: Diagnosis not present

## 2020-06-08 DIAGNOSIS — Z7901 Long term (current) use of anticoagulants: Secondary | ICD-10-CM | POA: Diagnosis not present

## 2020-06-08 DIAGNOSIS — Z8249 Family history of ischemic heart disease and other diseases of the circulatory system: Secondary | ICD-10-CM | POA: Insufficient documentation

## 2020-06-08 LAB — BASIC METABOLIC PANEL
Anion gap: 13 (ref 5–15)
BUN: 35 mg/dL — ABNORMAL HIGH (ref 6–20)
CO2: 26 mmol/L (ref 22–32)
Calcium: 9.1 mg/dL (ref 8.9–10.3)
Chloride: 97 mmol/L — ABNORMAL LOW (ref 98–111)
Creatinine, Ser: 1.67 mg/dL — ABNORMAL HIGH (ref 0.44–1.00)
GFR calc Af Amer: 39 mL/min — ABNORMAL LOW (ref >60)
GFR calc non Af Amer: 34 mL/min — ABNORMAL LOW (ref >60)
Glucose, Bld: 99 mg/dL (ref 70–99)
Potassium: 4.3 mmol/L (ref 3.5–5.1)
Sodium: 136 mmol/L (ref 135–145)

## 2020-06-08 LAB — CBC
HCT: 45.1 % (ref 36.0–46.0)
Hemoglobin: 13.6 g/dL (ref 12.0–15.0)
MCH: 26.8 pg (ref 26.0–34.0)
MCHC: 30.2 g/dL (ref 30.0–36.0)
MCV: 88.8 fL (ref 80.0–100.0)
Platelets: 458 10*3/uL — ABNORMAL HIGH (ref 150–400)
RBC: 5.08 MIL/uL (ref 3.87–5.11)
RDW: 16.6 % — ABNORMAL HIGH (ref 11.5–15.5)
WBC: 12.6 10*3/uL — ABNORMAL HIGH (ref 4.0–10.5)
nRBC: 0 % (ref 0.0–0.2)

## 2020-06-08 NOTE — Patient Instructions (Signed)
It was great to see you today! No medication changes are needed at this time.  Labs today We will only contact you if something comes back abnormal or we need to make some changes. Otherwise no news is good news!  Your physician recommends that you schedule a follow-up appointment in:  3-4 months in the Advanced Practitioners (PA/NP)  Clinic   Do the following things EVERYDAY: 1) Weigh yourself in the morning before breakfast. Write it down and keep it in a log. 2) Take your medicines as prescribed 3) Eat low salt foods--Limit salt (sodium) to 2000 mg per day.  4) Stay as active as you can everyday 5) Limit all fluids for the day to less than 2 liters  If you have any questions or concerns before your next appointment please send [place] a message through [place] or call our office at [phone].    TO LEAVE A MESSAGE FOR THE NURSE SELECT OPTION 2, PLEASE LEAVE A MESSAGE INCLUDING: . YOUR NAME . DATE OF BIRTH . CALL BACK NUMBER . REASON FOR CALL**this is important as we prioritize the call backs  YOU WILL RECEIVE A CALL BACK THE SAME DAY AS LONG AS YOU CALL BEFORE 4:00 PM

## 2020-06-15 ENCOUNTER — Other Ambulatory Visit (HOSPITAL_COMMUNITY): Payer: Self-pay | Admitting: Cardiology

## 2020-06-15 DIAGNOSIS — I2510 Atherosclerotic heart disease of native coronary artery without angina pectoris: Secondary | ICD-10-CM

## 2020-06-23 ENCOUNTER — Other Ambulatory Visit (HOSPITAL_COMMUNITY): Payer: Self-pay | Admitting: Cardiology

## 2020-06-23 DIAGNOSIS — I480 Paroxysmal atrial fibrillation: Secondary | ICD-10-CM

## 2020-07-01 ENCOUNTER — Encounter (HOSPITAL_BASED_OUTPATIENT_CLINIC_OR_DEPARTMENT_OTHER): Payer: Medicare Other | Admitting: Physician Assistant

## 2020-07-02 ENCOUNTER — Telehealth (HOSPITAL_COMMUNITY): Payer: Self-pay

## 2020-07-02 NOTE — Telephone Encounter (Signed)
[name] wanted a copy of her medication list. A printed copy was left at check in for her to pick up.

## 2020-07-08 ENCOUNTER — Other Ambulatory Visit: Payer: Self-pay | Admitting: Student

## 2020-07-08 DIAGNOSIS — F1021 Alcohol dependence, in remission: Secondary | ICD-10-CM

## 2020-07-17 ENCOUNTER — Other Ambulatory Visit: Payer: Medicare Other

## 2020-07-18 ENCOUNTER — Other Ambulatory Visit (HOSPITAL_COMMUNITY): Payer: Self-pay | Admitting: Adult Health

## 2020-07-28 ENCOUNTER — Ambulatory Visit
Admission: RE | Admit: 2020-07-28 | Discharge: 2020-07-28 | Disposition: A | Discharge status: Home / Self Care | Payer: Medicare Other | Source: Ambulatory Visit | Attending: Student | Admitting: Student

## 2020-07-28 DIAGNOSIS — F1021 Alcohol dependence, in remission: Secondary | ICD-10-CM

## 2020-08-21 ENCOUNTER — Encounter: Payer: Self-pay | Admitting: Podiatry

## 2020-08-21 ENCOUNTER — Other Ambulatory Visit: Payer: Self-pay

## 2020-08-21 ENCOUNTER — Ambulatory Visit (INDEPENDENT_AMBULATORY_CARE_PROVIDER_SITE_OTHER): Payer: Medicare Other | Admitting: Podiatry

## 2020-08-21 DIAGNOSIS — B351 Tinea unguium: Secondary | ICD-10-CM | POA: Diagnosis not present

## 2020-08-21 DIAGNOSIS — I739 Peripheral vascular disease, unspecified: Secondary | ICD-10-CM | POA: Diagnosis not present

## 2020-08-21 DIAGNOSIS — Z89431 Acquired absence of right foot: Secondary | ICD-10-CM

## 2020-08-21 DIAGNOSIS — E0843 Diabetes mellitus due to underlying condition with diabetic autonomic (poly)neuropathy: Secondary | ICD-10-CM | POA: Diagnosis not present

## 2020-08-21 DIAGNOSIS — M79676 Pain in unspecified toe(s): Secondary | ICD-10-CM

## 2020-08-21 NOTE — Progress Notes (Signed)
This patient returns to my office for at risk foot care.  This patient requires this care by a professional since this patient will be at risk due to having  PAD, coagulation defect, and chronic kidney disease.  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  Patient is taking plavix.  This patient is unable to cut nails herself since the patient cannot reach her nails.These nails on her left foot  are painful walking and wearing shoes.  This patient presents for at risk foot care today.  General Appearance  Alert, conversant and in no acute stress.  Vascular  Dorsalis pedis and posterior tibial  pulses are not  palpable  bilaterally.  Capillary return is diminished   bilaterally. Cold feet  bilaterally.  Neurologic  Senn-Weinstein monofilament wire test diminished.  bilaterally. Muscle power within normal limits bilaterally.  Nails Thick disfigured discolored nails with subungual debris  1-4 left foot.  . No evidence of bacterial infection or drainage bilaterally.  Orthopedic  No limitations of motion  feet .  No crepitus or effusions noted.  No bony pathology or digital deformities noted. TMA right foot.  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  No signs of infections or ulcers noted.     Onychomycosis    Pain in left toes  Consent was obtained for treatment procedures.   Mechanical debridement of nails 1-4  Left foot. performed with a nail nipper.  Filed with dremel without incident. Patient is to schedule with Liliane Channel for new diabetic shoes.     Return office visit  3 months                    Told patient to return for periodic foot care and evaluation due to potential at risk complications.   Gardiner Barefoot DPM

## 2020-08-24 ENCOUNTER — Encounter (HOSPITAL_COMMUNITY): Payer: Medicare Other

## 2020-08-31 ENCOUNTER — Encounter (HOSPITAL_COMMUNITY): Payer: Medicare Other

## 2020-09-04 ENCOUNTER — Telehealth: Payer: Self-pay | Admitting: Podiatry

## 2020-09-04 NOTE — Telephone Encounter (Signed)
Pt left message @ 1052 today asking if she could come in to pick up diabetic shoes in an hour.  I returned call multiple times and someone just answered and was talking and then hung up. Pt needs an appt to pick up shoes and we do not have appts available today.

## 2020-09-04 NOTE — Telephone Encounter (Signed)
Received call back from caregiver and pt is scheduled to be seen 10.29 to pick up shoes.

## 2020-09-10 ENCOUNTER — Encounter (HOSPITAL_COMMUNITY): Payer: Self-pay

## 2020-09-10 ENCOUNTER — Ambulatory Visit (HOSPITAL_COMMUNITY)
Admission: RE | Admit: 2020-09-10 | Discharge: 2020-09-10 | Disposition: A | Discharge status: Home / Self Care | Payer: Medicare Other | Source: Ambulatory Visit | Attending: Cardiology | Admitting: Cardiology

## 2020-09-10 ENCOUNTER — Other Ambulatory Visit: Payer: Self-pay

## 2020-09-10 VITALS — BP 104/60 | HR 67 | Wt 209.8 lb

## 2020-09-10 DIAGNOSIS — E1122 Type 2 diabetes mellitus with diabetic chronic kidney disease: Secondary | ICD-10-CM | POA: Diagnosis not present

## 2020-09-10 DIAGNOSIS — K7460 Unspecified cirrhosis of liver: Secondary | ICD-10-CM | POA: Insufficient documentation

## 2020-09-10 DIAGNOSIS — Z7982 Long term (current) use of aspirin: Secondary | ICD-10-CM | POA: Diagnosis not present

## 2020-09-10 DIAGNOSIS — Z79899 Other long term (current) drug therapy: Secondary | ICD-10-CM | POA: Diagnosis not present

## 2020-09-10 DIAGNOSIS — I5042 Chronic combined systolic (congestive) and diastolic (congestive) heart failure: Secondary | ICD-10-CM | POA: Insufficient documentation

## 2020-09-10 DIAGNOSIS — F17210 Nicotine dependence, cigarettes, uncomplicated: Secondary | ICD-10-CM | POA: Insufficient documentation

## 2020-09-10 DIAGNOSIS — I2510 Atherosclerotic heart disease of native coronary artery without angina pectoris: Secondary | ICD-10-CM | POA: Diagnosis not present

## 2020-09-10 DIAGNOSIS — G4733 Obstructive sleep apnea (adult) (pediatric): Secondary | ICD-10-CM | POA: Insufficient documentation

## 2020-09-10 DIAGNOSIS — Z7902 Long term (current) use of antithrombotics/antiplatelets: Secondary | ICD-10-CM | POA: Insufficient documentation

## 2020-09-10 DIAGNOSIS — Z7901 Long term (current) use of anticoagulants: Secondary | ICD-10-CM | POA: Diagnosis not present

## 2020-09-10 DIAGNOSIS — I480 Paroxysmal atrial fibrillation: Secondary | ICD-10-CM | POA: Insufficient documentation

## 2020-09-10 DIAGNOSIS — E1151 Type 2 diabetes mellitus with diabetic peripheral angiopathy without gangrene: Secondary | ICD-10-CM | POA: Insufficient documentation

## 2020-09-10 DIAGNOSIS — Z794 Long term (current) use of insulin: Secondary | ICD-10-CM | POA: Diagnosis not present

## 2020-09-10 DIAGNOSIS — I130 Hypertensive heart and chronic kidney disease with heart failure and stage 1 through stage 4 chronic kidney disease, or unspecified chronic kidney disease: Secondary | ICD-10-CM | POA: Diagnosis not present

## 2020-09-10 DIAGNOSIS — N183 Chronic kidney disease, stage 3 unspecified: Secondary | ICD-10-CM | POA: Insufficient documentation

## 2020-09-10 DIAGNOSIS — R001 Bradycardia, unspecified: Secondary | ICD-10-CM | POA: Insufficient documentation

## 2020-09-10 DIAGNOSIS — J449 Chronic obstructive pulmonary disease, unspecified: Secondary | ICD-10-CM | POA: Insufficient documentation

## 2020-09-10 LAB — COMPREHENSIVE METABOLIC PANEL
ALT: 88 U/L — ABNORMAL HIGH (ref 0–44)
AST: 53 U/L — ABNORMAL HIGH (ref 15–41)
Albumin: 3.5 g/dL (ref 3.5–5.0)
Alkaline Phosphatase: 203 U/L — ABNORMAL HIGH (ref 38–126)
Anion gap: 14 (ref 5–15)
BUN: 54 mg/dL — ABNORMAL HIGH (ref 6–20)
CO2: 23 mmol/L (ref 22–32)
Calcium: 8.8 mg/dL — ABNORMAL LOW (ref 8.9–10.3)
Chloride: 104 mmol/L (ref 98–111)
Creatinine, Ser: 2.07 mg/dL — ABNORMAL HIGH (ref 0.44–1.00)
GFR, Estimated: 27 mL/min — ABNORMAL LOW (ref >60)
Glucose, Bld: 95 mg/dL (ref 70–99)
Potassium: 4.8 mmol/L (ref 3.5–5.1)
Sodium: 141 mmol/L (ref 135–145)
Total Bilirubin: 0.4 mg/dL (ref 0.3–1.2)
Total Protein: 6.9 g/dL (ref 6.5–8.1)

## 2020-09-10 LAB — CBC
HCT: 43.2 % (ref 36.0–46.0)
Hemoglobin: 13.1 g/dL (ref 12.0–15.0)
MCH: 28.4 pg (ref 26.0–34.0)
MCHC: 30.3 g/dL (ref 30.0–36.0)
MCV: 93.5 fL (ref 80.0–100.0)
Platelets: 313 10*3/uL (ref 150–400)
RBC: 4.62 MIL/uL (ref 3.87–5.11)
RDW: 15.9 % — ABNORMAL HIGH (ref 11.5–15.5)
WBC: 8.7 10*3/uL (ref 4.0–10.5)
nRBC: 0 % (ref 0.0–0.2)

## 2020-09-10 LAB — BASIC METABOLIC PANEL
Anion gap: 11 (ref 5–15)
BUN: 53 mg/dL — ABNORMAL HIGH (ref 6–20)
CO2: 24 mmol/L (ref 22–32)
Calcium: 8.9 mg/dL (ref 8.9–10.3)
Chloride: 106 mmol/L (ref 98–111)
Creatinine, Ser: 2.05 mg/dL — ABNORMAL HIGH (ref 0.44–1.00)
GFR, Estimated: 28 mL/min — ABNORMAL LOW (ref >60)
Glucose, Bld: 98 mg/dL (ref 70–99)
Potassium: 4.8 mmol/L (ref 3.5–5.1)
Sodium: 141 mmol/L (ref 135–145)

## 2020-09-10 LAB — BRAIN NATRIURETIC PEPTIDE: B Natriuretic Peptide: 178.7 pg/mL — ABNORMAL HIGH (ref 0.0–100.0)

## 2020-09-10 LAB — TSH: TSH: 4.359 u[IU]/mL (ref 0.350–4.500)

## 2020-09-10 MED ORDER — TORSEMIDE 20 MG PO TABS: ORAL_TABLET | ORAL | 3 refills | Status: DC

## 2020-09-10 NOTE — Patient Instructions (Addendum)
INCREASE Torsemide to 60mg  (3 tabs) in the morning and 40mg   (2 tabs) in the evening   You have been referred to the AFIB clinic, you will be called to schedule an appointment.    You will be referred back to the Paramedicine program to help manage your medications at home. Someone will call you to discuss a home visit.    Labs today and repeat in 1 week We will only contact you if something comes back abnormal or we need to make some changes. Otherwise no news is good news!   Your physician recommends that you schedule a follow-up appointment in: 4 weeks with the Nurse Practitioner or Physicians Assistant  November garage code: 3008  Please call office at [phone] option [phone] if you have any questions or concerns.   At the [facility], you and your health needs are our priority. As part of our continuing mission to provide you with exceptional heart care, we have created designated Provider Care Teams. These Care Teams include your primary Cardiologist (physician) and Advanced Practice Providers (APPs- Physician Assistants and Nurse Practitioners) who all work together to provide you with the care you need, when you need it.   You may see any of the following providers on your designated Care Team at your next follow up: [name] Dr Glori Bickers . Dr Loralie Champagne . Darrick Grinder, NP . Lyda Jester, PA . Audry Riles, PharmD   Please be sure to bring in all your medications bottles to every appointment.

## 2020-09-10 NOTE — Progress Notes (Signed)
Patient ID: [name], female   DOB: 05/21/1961, 59 y.o.   MRN: [id number]    Advanced Heart Failure Clinic Note   PCP: Dr Roxy Manns HF Cardiology: Dr. Aundra Dubin PV: Dr. Gwenlyn Found  [name] is a 59 y.o. with history of PAD, carotid stenosis s/p left CEA, relatively mild CAD, chronic systolic CHF, paroxysmal atrial fibrillation and prior substance abuse presents for CHF clinic followup.  She was admitted in 1/17 with acute hypoxemic respiratory failure in the setting of atrial fibrillation/RVR and volume overload.  She was initially intubated.  She converted back to NSR with amiodarone gtt.  She was treated with IV Lasix, steroids, bronchodilators.  She developed AKI and losartan was stopped.    She was admitted in 3/17 with symptomatic bradycardia.  She was supposed to stop diltiazem after this admission but continued to take it.  She presented later in 3/17, again with symptomatic bradycardia (junctional bradycardia), hypotension, and AKI.  Diltiazem and bisoprolol were stopped.  HR recovered and she has had no problems since.  ACEI was stopped with AKI.   In 5/18, she had peripheral angiography with atherectomy of right SFA.  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 7/18 ABIs showed 0.65 on right, 0.31 on left.  In 8/20, she had peripheral angiography showing totally occluded left SFA.  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 Donnetta Hutching.  ABIs in 2/21 were 0.44 on right, 0.99 on left.   Most recent echo in 1/21 showed EF 50%, mild LVH, normal RV, PASP 60 mmHg.   She presents to clinic today for f/u. Reports doing ok from HF standpoint. Denies dyspnea. No CP. She has been compliant w/ cardiomems transmissions. dPAP goal is set to 20. She was 21 today. Ranges have been 17-22. She has had some confusion w/ her meds. Unsure of what she is taking. Previously followed by paramedicine but no longer following. Her family has been assisting  some w/ meds. She does report recent frequent falls, at least 4 in the last week, which she says have been mechanical. Trips over her feet, walking too fast and not taking her time. Denies head trauma. EKG today shows sinus bradycardia 56 bpm. BP 104/60. She has mild LEE on exam.   Cardiomems: Reading : 21 today   Labs (1/17): K 5.2, creatinine 1.4, AST 43, ALT normal Labs (2/17): K 4.2, creatinine 1.3, BNP 607, TSH normal Labs (3/17): K 4.2, creatinine 1.45, AST/ALT normal, HCT 28.7 Labs (4/17): K 4.4, creatinine 1.61 Labs (08/31/2016): K 4.5 Creatinine 1.43  Labs (11/17): K 4.2, creatinine 1.4 Labs (9/18): K 4.1, creatinine 1.1, TSH normal Labs 04/13/2018: K 5 Creatinine 1.75 Labs (2/21): K 4.1, creatinine 1.58 Labs (01/28/20): K 4.1 Creatinine 1.4  Labs (06/08/20): K 4.3, creatinine 1.67   PMH: 1. Carotid stenosis: Known occluded right carotid.  Left CEA in 4/16.  2. CAD: LHC in 12/15 with 80% stenosis in small OM1, nonobstructive disease in other territories.  3. Chronic systolic CHF: Nonischemic cardiomyopathy (?due to ETOH or prior drug abuse).  - Echo (1/17) with EF 45%, mild LV hypertrophy, moderate diastolic dysfunction, inferolateral severe hypokinesis, mildly decreased RV systolic function.  - Echo (1/18): EF 40-45%, mild LVH, normal RV size and systolic function.  - Echo (1/21): EF 50%, mild LVH, normal RV, PASP 60 mmHg.  4. Atrial fibrillation: Paroxysmal.   5. Type II diabetes 6. CKD: Stage III. 7. COPD:  Quit smoking 1/17. On home oxygen.  8. 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  No longer drinks.  9. Hypothyroidism 10. PAD: Atherectomy SFA in 2014 (Dr Gwenlyn Found).  Peripheral arterial dopplers (2/17) with focal 75-99% proximal right SFA stenosis, occluded mid-distal right SFA, chronic occlusion of all runoff arteries on right. - In 5/18, she had peripheral angiography with atherectomy of right SFA.   - In 6/18, she had right 2nd ray resection later followed by transmetatarsal  amputation on right.  - 7/18 ABIs showed 0.65 on right, 0.31 on left.   - Peripheral angiography 8/20: Totally occluded left SFA.  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 Donnetta Hutching.   - ABIs (2/21): 0.44 R, 0.99 L.  11. Anemia 12. Prior cocaine abuse.  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  14. OSA: Has not been able to tolerate CPAP.   SH: Lives with sister.  Prior cocaine abuse.  Prior ETOH abuse.  Quit smoking in 1/17, restarted, and quit again in 4/19.  FH: CAD  Review of systems complete and found to be negative unless listed in HPI.    Current Outpatient Medications  Medication Sig Dispense Refill  . albuterol (VENTOLIN HFA) 108 (90 Base) MCG/ACT inhaler Inhale 2 puffs into the lungs every 6 (six) hours as needed for wheezing or shortness of breath. 18 g 0  . amiodarone (PACERONE) 200 MG tablet Take 0.5 tablets (100 mg total) by mouth daily. 30 tablet 6  . amLODipine (NORVASC) 10 MG tablet Take 1 tablet (10 mg total) by mouth daily. 30 tablet 3  . Aspirin-Caffeine (BC FAST PAIN RELIEF PO) Take 1 packet by mouth daily as needed (pain).    . bisoprolol (ZEBETA) 5 MG tablet Take 1 tablet (5 mg total) by mouth daily. 30 tablet 5  . budesonide-formoterol (SYMBICORT) 160-4.5 MCG/ACT inhaler Inhale 2 puffs into the lungs 2 (two) times daily. (Patient taking differently: Inhale 2 puffs into the lungs 2 (two) times daily as needed (shortness of breath). ) 1 Inhaler 2  . cholecalciferol (VITAMIN D) 1000 units tablet Take 1 tablet (1,000 Units total) by mouth daily.    . clopidogrel (PLAVIX) 75 MG tablet Take 1 tablet (75 mg total) by mouth daily. 30 tablet 3  . cyclobenzaprine (FLEXERIL) 10 MG tablet Take 1 tablet (10 mg total) by mouth 2 (two) times daily as needed for muscle spasms. 20 tablet 0  . ELIQUIS 5 MG TABS tablet TAKE 1 TABLET(5 MG) BY MOUTH TWICE DAILY 60 tablet 11  . empagliflozin (JARDIANCE) 10 MG TABS tablet Take 10 mg by  mouth daily before breakfast. 30 tablet 6  . folic acid (FOLVITE) 1 MG tablet Take 1 tablet (1 mg total) by mouth daily. 30 tablet 0  . Insulin Glargine (LANTUS SOLOSTAR) 100 UNIT/ML Solostar Pen Inject 40 Units into the skin at bedtime.    . Insulin Lispro (HUMALOG KWIKPEN) 200 UNIT/ML SOPN Inject 8 Units into the skin 3 (three) times daily with meals. (Patient taking differently: Inject 8-15 Units into the skin 3 (three) times daily with meals. )    . levothyroxine (SYNTHROID, LEVOTHROID) 50 MCG tablet Take 1 tablet (50 mcg total) by mouth daily. 30 tablet 0  . nortriptyline (PAMELOR) 50 MG capsule Take 50 mg by mouth at bedtime.    . pantoprazole (PROTONIX) 40 MG tablet Take 1 tablet (40 mg total) by mouth daily.    . potassium chloride SA (KLOR-CON) 20 MEQ tablet  Take 1 tablet (20 mEq total) by mouth daily. 90 tablet 3  . senna-docusate (SENOKOT-S) 8.6-50 MG tablet Take 2 tablets by mouth at bedtime as needed for mild constipation.    [name] spironolactone (ALDACTONE) 25 MG tablet Take 1 tablet (25 mg total) by mouth daily. 90 tablet 3  . tiotropium (SPIRIVA HANDIHALER) 18 MCG inhalation capsule Place 1 capsule (18 mcg total) into inhaler and inhale every morning. 30 capsule 2  . torsemide (DEMADEX) 20 MG tablet Take 2 tablets (40 mg total) by mouth 2 (two) times daily. 150 tablet 3  . ACCU-CHEK GUIDE test strip USE TO CHECK BLOOD SUGAR FOUR TIMES DAILY    . acetaminophen (TYLENOL) 500 MG tablet Take 2 tablets (1,000 mg total) by mouth every 6 (six) hours as needed for mild pain. 30 tablet 0  . atorvastatin (LIPITOR) 40 MG tablet TAKE 1 TABLET BY MOUTH EVERY EVENING 90 tablet 3  . colchicine 0.6 MG tablet TAKE 2 TABLETS BY MOUTH AT ONSET OF GOUT FLARE, THEN TAKE 1 TABLET IN 1 HOUR, THEN STOP 30 tablet 3   No current facility-administered medications for this encounter.   Vitals:   09/10/20 1145  BP: 104/60  Pulse: 67  SpO2: 95%  Weight: 95.2 kg (209 lb 12.8 oz)     Wt Readings from Last 3  Encounters:  09/10/20 95.2 kg (209 lb 12.8 oz)  06/08/20 88.5 kg (195 lb)  05/28/20 92.5 kg (204 lb)    PHYSICAL EXAM:  General:  Looks much older than actual age. No respiratory difficulty HEENT: normal Neck: supple. no JVD. Carotids 2+ bilat; no bruits. No lymphadenopathy or thyromegaly appreciated. Cor: PMI nondisplaced. Regular rate & rhythm. No rubs, gallops or murmurs. Lungs: decreased BS at the bases bilaterally  Abdomen: soft, nontender, nondistended. No hepatosplenomegaly. No bruits or masses. Good bowel sounds. Extremities: no cyanosis, clubbing, rash, 1+ bilateral LEE edema Neuro: alert & oriented x 3, cranial nerves grossly intact. moves all 4 extremities w/o difficulty. Affect pleasant.  Assessment/Plan: 1. Chronic diastolic CHF: EF 76% on last echo in 1/21. Has had presumed nonischemic given LHC in 12/15 showing only 80% stenosis in small OM1.  EF as low as 40-45% in the past.   - Has Cardiomems: dPAP goal set to 20. Reading 21 today, Ranges have been between 17-22. Mild LEE on exam, NYHA III  - increase torsemide to 60 mg qam/ 40 mg qpm. - Continue spironolactone 25 mg daily.  - Check BMET and again in 7 days, will adjust KCl based on K level today  2. Atrial fibrillation: Paroxysmal. Sinus brady on EKG, HR 60s  - Continue amiodarone 100 mg daily -   She is on levothyroxine, followed by PCP.  Needs regular eye exams. Check CMP and TSH today  - Continue Eliquis 5 mg bid. Concern given recent mechanical falls. Will refer to AFib clinic for consult for potential [employer] device  - Check CBC today.   3. PAD: Claudication bilaterally.  Not candidate for cilostazol with CHF.  She has had a left fem-pop bypass in 8/20.  ABIs in 2/21 were very abnormal on right.   - She is still on Plavix along with Eliquis. No bleeding issues.  - She is followed by Drs Gwenlyn Found and Early for PAD.  - Continue atorvastatin.  4. COPD: Continues to smoke 1ppd. Discussed smoking cessation.  5. H/o  cirrhosis: Likely from ETOH, now abstinent.  6. OSA: Not able to tolerate CPAP.  - She was referred to  Dr. Radford Pax to see if there is a form of CPAP that she can tolerate.  7. CKD stage III: Check CMP today  8. HTN: Controlled on current regimen   9.Type 2 diabetes: - Continue Jardiance 10 mg daily.  10. CAD: LHC in 12/15 with 80% stenosis in small OM1, nonobstructive disease in other territories. - denies CP  - Continue Plavix, statin + SGLT2   Refer back to paramedicine to help w/ meds. F/u w/ APP in 4-6 more weeks.   Lyda Jester, PA-C  09/10/2020

## 2020-09-11 ENCOUNTER — Telehealth (HOSPITAL_COMMUNITY): Payer: Self-pay | Admitting: Licensed Clinical Social Worker

## 2020-09-11 ENCOUNTER — Ambulatory Visit (INDEPENDENT_AMBULATORY_CARE_PROVIDER_SITE_OTHER): Payer: Medicare Other | Admitting: Orthotics

## 2020-09-11 DIAGNOSIS — Z89411 Acquired absence of right great toe: Secondary | ICD-10-CM | POA: Diagnosis not present

## 2020-09-11 DIAGNOSIS — Z89422 Acquired absence of other left toe(s): Secondary | ICD-10-CM | POA: Diagnosis not present

## 2020-09-11 DIAGNOSIS — E1140 Type 2 diabetes mellitus with diabetic neuropathy, unspecified: Secondary | ICD-10-CM | POA: Diagnosis not present

## 2020-09-11 DIAGNOSIS — E0843 Diabetes mellitus due to underlying condition with diabetic autonomic (poly)neuropathy: Secondary | ICD-10-CM

## 2020-09-11 DIAGNOSIS — Z89421 Acquired absence of other right toe(s): Secondary | ICD-10-CM | POA: Diagnosis not present

## 2020-09-11 NOTE — Telephone Encounter (Signed)
Paramedicine Initial Assessment:  Housing:  In what kind of housing do you live? House/apt/trailer/shelter? house  Do you rent/pay a mortgage/own? rent  Do you live with anyone? Cousin/caregiver- Tammy  Are you currently worried about losing your housing? no  Within the past 12 months have you ever stayed outside, in a car, tent, a shelter, or temporarily with someone? no  Within the past 12 months have you been unable to get utilities when it was really needed? no  Just moved into this house about 3 months ago.  Social:  Do you have family or friends who live locally? Two sisters who live in [place].  Food:  Within the past 12 months were you ever worried that food would run out before you got money to buy more? no  Within the past 62months have you run out of food and didn't have money to buy more? No  Get food stamps currently also state insurance gives her $55/month for fresh foods  Income:  What is your current source of income? $1,134  How hard is it for you to pay for the basics like food housing, medical care, and utilities? Not very  Insurance:  Are you currently insured? yes  Do you have prescription coverage? yes  Transportation:  Do you have transportation to your medical appointments? Yes- cousin can take her or she uses "transportation" through her insurance.  In the past 12 months has lack of transportation kept you from medical appts or from getting medications? no  In the past 12 months has lack of transportation kept you from meetings, work, or getting things you needed? no   Daily Health Needs: Do you have a working scale at home? yes  How do you manage your medications at home?   Do you ever take your medications differently than prescribed? Not intentionally but expresses concerns about taking fluid pills again so that she doesn't have to worry about wetting the bed.  Do you have issues affording your medications? no  If yes, has this ever  prevented you from obtaining medications? no  Do you have any concerns with mobility at home? Has fallen at home  Do you use any assistive devices at home or have PCS at home?  Do you have a PCP? Was seeing Novella Rob but is trying to find new PCP-   Are there any additional barriers you see to getting the care you need? Not at this time  CSW will continue to follow through paramedicine program and assist as needed.  Jorge Ny, LCSW Clinical Social Worker [facility] Desk#: [phone] Cell#: [phone]

## 2020-09-13 ENCOUNTER — Other Ambulatory Visit (HOSPITAL_COMMUNITY): Payer: Self-pay | Admitting: Cardiology

## 2020-09-16 ENCOUNTER — Other Ambulatory Visit (HOSPITAL_COMMUNITY): Payer: Self-pay

## 2020-09-16 NOTE — Progress Notes (Signed)
Paramedicine Encounter    Patient ID: [name], female    DOB: [birth year], 59 y.o.   MRN: [id number]   Patient Care Team: Loretha Brasil, FNP (Inactive) as PCP - General (Family Medicine) [name], MD as PCP - Cardiology (Cardiology) [name], MD as Consulting Physician (Cardiology) [name], MD as Consulting Physician (Pulmonary Disease)  Patient Active Problem List   Diagnosis Date Noted  . Hyperlipidemia 04/03/2020  . PAD (peripheral artery disease) ([place]) 12/26/2019  . Acute renal failure ([place])   . Acute respiratory failure with hypoxia ([place]) 12/02/2019  . Acute exacerbation of CHF (congestive heart failure) ([place]) 12/01/2019  . Cellulitis in diabetic foot ([place]) 07/08/2019  . Osteomyelitis ([place]) 06/21/2019  . NICM (nonischemic cardiomyopathy) ([place]) 06/20/2019  . Non-healing ulcer ([place]) 06/20/2019  . Acute CHF (congestive heart failure) ([place]) 11/26/2018  . CHF (congestive heart failure) ([place]) 11/26/2018  . Acute respiratory failure ([place]) 10/21/2018  . AKI (acute kidney injury) ([place]) 08/18/2018  . Coagulation disorder ([place]) 08/09/2017  . Depression 07/21/2017  . At risk for adverse drug reaction 06/20/2017  . Peripheral neuropathy 06/20/2017  . Acute osteomyelitis of right foot ([place]) 06/13/2017  . S/P transmetatarsal amputation of foot, right ([place]) 06/05/2017  . 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[place])   . SIRS (systemic inflammatory response syndrome) ([place]) 04/06/2017  . CKD (chronic kidney disease), stage III ([place]) 11/24/2016  . Suspected sleep apnea 11/24/2016  . Ulcer of toe of right foot, with necrosis of bone ([place]) 10/27/2016  . Ulcer of left lower leg ([place]) 05/19/2016  . Severe obesity (BMI >= 40) ([place]) 02/24/2016  . COPD GOLD 0  02/24/2016  . Encounter for therapeutic drug monitoring 02/10/2016  . Symptomatic bradycardia  01/12/2016  . Essential hypertension 12/22/2015  . Chronic combined systolic and diastolic CHF (congestive heart failure) ([place])   . Wheeze   . 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 Tobacco abuse 10/23/2015  . Coronary artery disease   . DOE (dyspnea on exertion) 04/29/2015  . Diabetes mellitus type 2, uncontrolled ([place]) 02/08/2015  . Influenza A 02/07/2015  . Wrist fracture, left, with routine healing, subsequent encounter 02/05/2015  . Wrist fracture, left, closed, initial encounter 01/29/2015  . PAF (paroxysmal atrial fibrillation) ([place]) 01/16/2015  . Carotid arterial disease ([place]) 01/16/2015  . Claudication ([place]) 01/15/2015  . Demand ischemia ([place]) 10/29/2014  . Insomnia 02/03/2014  . COPD with acute exacerbation ([place]) 02/01/2014  . S/P peripheral artery angioplasty - [place]bo[place] atherectomy; R SFA 09/11/2013    Class: Acute  . Leg pain, bilateral 08/19/2013  . Hypothyroidism 07/31/2013  . Cellulitis 06/13/2013  . History of cocaine abuse ([place]) 06/13/2013  . Long term current use of anticoagulant therapy 05/20/2013  . Alcohol abuse   . Narcotic abuse ([place])   . Marijuana abuse   . Alcoholic cirrhosis ([place])   . DM (diabetes mellitus), type 2 with peripheral vascular complications ([name])     Current Outpatient Medications:  .  ACCU-CHEK GUIDE test strip, USE TO CHECK BLOOD SUGAR FOUR TIMES DAILY, Disp: , Rfl:  .  acetaminophen (TYLENOL) 500 MG tablet, Take 2 tablets (1,000 mg total) by mouth every 6 (six) hours as needed for mild pain., Disp: 30 tablet, Rfl: 0 .  albuterol (VENTOLIN HFA) 108 (90 Base) MCG/ACT inhaler, Inhale 2 puffs into the lungs every 6 (six) hours as needed for wheezing or shortness of breath.,  Disp: 18 g, Rfl: 0 .  amiodarone (PACERONE) 200 MG tablet, Take 0.5 tablets (100 mg total) by mouth daily., Disp: 30 tablet, Rfl: 6 .  amLODipine (NORVASC) 10 MG tablet, Take 1 tablet (10 mg total) by mouth daily., Disp: 30 tablet, Rfl: 3 .  Aspirin-Caffeine (BC FAST PAIN  RELIEF PO), Take 1 packet by mouth daily as needed (pain)., Disp: , Rfl:  .  bisoprolol (ZEBETA) 5 MG tablet, Take 1 tablet (5 mg total) by mouth daily., Disp: 30 tablet, Rfl: 5 .  budesonide-formoterol (SYMBICORT) 160-4.5 MCG/ACT inhaler, Inhale 2 puffs into the lungs 2 (two) times daily. (Patient taking differently: Inhale 2 puffs into the lungs 2 (two) times daily as needed (shortness of breath). ), Disp: 1 Inhaler, Rfl: 2 .  cholecalciferol (VITAMIN D) 1000 units tablet, Take 1 tablet (1,000 Units total) by mouth daily., Disp:  , Rfl:  .  clopidogrel (PLAVIX) 75 MG tablet, Take 1 tablet (75 mg total) by mouth daily., Disp: 30 tablet, Rfl: 3 .  cyclobenzaprine (FLEXERIL) 10 MG tablet, Take 1 tablet (10 mg total) by mouth 2 (two) times daily as needed for muscle spasms., Disp: 20 tablet, Rfl: 0 .  ELIQUIS 5 MG TABS tablet, TAKE 1 TABLET(5 MG) BY MOUTH TWICE DAILY, Disp: 60 tablet, Rfl: 11 .  folic acid (FOLVITE) 1 MG tablet, Take 1 tablet (1 mg total) by mouth daily., Disp: 30 tablet, Rfl: 0 .  Insulin Glargine (LANTUS SOLOSTAR) 100 UNIT/ML Solostar Pen, Inject 40 Units into the skin at bedtime., Disp: , Rfl:  .  Insulin Lispro (HUMALOG KWIKPEN) 200 UNIT/ML SOPN, Inject 8 Units into the skin 3 (three) times daily with meals. (Patient taking differently: Inject 8-15 Units into the skin 3 (three) times daily with meals. ), Disp: , Rfl:  .  JARDIANCE 10 MG TABS tablet, TAKE 1 TABLET BY MOUTH DAILY BEFORE BREAKFAST, Disp: 30 tablet, Rfl: 6 .  levothyroxine (SYNTHROID, LEVOTHROID) 50 MCG tablet, Take 1 tablet (50 mcg total) by mouth daily., Disp: 30 tablet, Rfl: 0 .  nortriptyline (PAMELOR) 50 MG capsule, Take 50 mg by mouth at bedtime., Disp: , Rfl:  .  pantoprazole (PROTONIX) 40 MG tablet, Take 1 tablet (40 mg total) by mouth daily., Disp:  , Rfl:  .  potassium chloride SA (KLOR-CON) 20 MEQ tablet, Take 1 tablet (20 mEq total) by mouth daily., Disp: 90 tablet, Rfl: 3 .  spironolactone (ALDACTONE) 25 MG  tablet, Take 1 tablet (25 mg total) by mouth daily. (Patient taking differently: Take 25 mg by mouth at bedtime. ), Disp: 90 tablet, Rfl: 3 .  tiotropium (SPIRIVA HANDIHALER) 18 MCG inhalation capsule, Place 1 capsule (18 mcg total) into inhaler and inhale every morning., Disp: 30 capsule, Rfl: 2 .  torsemide (DEMADEX) 20 MG tablet, Take 3 tablets (60 mg total) by mouth every morning AND 2 tablets (40 mg total) every evening., Disp: 150 tablet, Rfl: 3 .  atorvastatin (LIPITOR) 40 MG tablet, TAKE 1 TABLET BY MOUTH EVERY EVENING (Patient not taking: Reported on 09/16/2020), Disp: 90 tablet, Rfl: 3 .  colchicine 0.6 MG tablet, TAKE 2 TABLETS BY MOUTH AT ONSET OF GOUT FLARE, THEN TAKE 1 TABLET IN 1 HOUR, THEN STOP (Patient not taking: Reported on 09/16/2020), Disp: 30 tablet, Rfl: 3 .  senna-docusate (SENOKOT-S) 8.6-50 MG tablet, Take 2 tablets by mouth at bedtime as needed for mild constipation. (Patient not taking: Reported on 09/16/2020), Disp: , Rfl:  Allergies  Allergen Reactions  . Gabapentin Nausea And  Vomiting and Other (See Comments)    POSSIBLE SHAKING? TAKING MED CURRENTLY  . Lyrica [Pregabalin] Other (See Comments)    Shaking Pt still taking lyrica--"probably will stop taking"       Social History   Socioeconomic History  . Marital status: Divorced    Spouse name: Not on file  . Number of children: 1  . Years of education: 26  . Highest education level: 12th grade  Occupational History  . Occupation: disabled  Tobacco Use  . Smoking status: Current Some Day Smoker    Packs/day: 1.00    Years: 44.00    Pack years: 44.00    Types: E-cigarettes, Cigarettes  . Smokeless tobacco: Former Systems developer    Types: Snuff  Vaping Use  . Vaping Use: Former  . Devices: 11/26/2018 "stopped months ago"  Substance and Sexual Activity  . Alcohol use: Yes    Comment: occ  . Drug use: Not Currently    Types: "Crack" cocaine, Marijuana, Oxycodone  . Sexual activity: Not Currently  Other Topics  Concern  . Not on file  Social History Narrative   Lives in [place], in [place] with sister.  They are looking to move but don't have a place to go yet.     Social Determinants of Health   Financial Resource Strain: Low Risk   . Difficulty of Paying Living Expenses: Not very hard  Food Insecurity: No Food Insecurity  . Worried About Charity fundraiser in the Last Year: Never true  . Ran Out of Food in the Last Year: Never true  Transportation Needs: No Transportation Needs  . Lack of Transportation (Medical): No  . Lack of Transportation (Non-Medical): No  Physical Activity: Insufficiently Active  . Days of Exercise per Week: 1 day  . Minutes of Exercise per Session: 10 min  Stress:   . Feeling of Stress : Not on file  Social Connections:   . Frequency of Communication with Friends and Family: Not on file  . Frequency of Social Gatherings with Friends and Family: Not on file  . Attends Religious Services: Not on file  . Active Member of Clubs or Organizations: Not on file  . Attends Archivist Meetings: Not on file  . Marital Status: Not on file  Intimate Partner Violence:   . Fear of Current or Ex-Partner: Not on file  . Emotionally Abused: Not on file  . Physically Abused: Not on file  . Sexually Abused: Not on file    Physical Exam      Future Appointments  Date Time Provider [place]  10/12/2020 10:30 AM [facility] PA/NP [facility] None  11/27/2020  8:45 AM Gardiner Barefoot, DPM [facility]    BP (!) 160/78   Pulse 60   Resp 20   Wt 207 lb (93.9 kg)   LMP 11/27/2012   SpO2 98%   BMI 35.53 kg/m   Weight yesterday-? Last visit weight-209 @ clinic  CBG -148  She was referred back to paramedicine She wants to get a new PCP-they had some issues with the providers there and no longer able to be seen there.  Sounds like it was a lot of miscommunication about her meds.  She was told by her PCP to take the [facility] powder and bayer back and body 500mg   asa/caffeine.  I advised her to not take that anymore.  She no longer using the bubble packs-roommate said she was picking through what she wanted to take.  I encouraged  her to take meds as directed and we reviewed the patient consent and told her she is expected to take her meds. I refilled her pill box-the roommate didn't know the 2 different torsemide bottles were the same medicine so she was getting 2 extra tablets each day.  That was corrected.  Pt thinks she is doing ok on her breathing.  She denies c/p, no dizziness unless she bends over and stands back up too fast.  No palpitations.  She has wheezing all over lobes but she denies sob, she doesn't use her inhalers hardly.   Marylouise Stacks, [facility] Paramedic  09/16/20

## 2020-09-17 ENCOUNTER — Ambulatory Visit (HOSPITAL_COMMUNITY): Payer: Medicare Other | Admitting: Physician Assistant

## 2020-09-18 ENCOUNTER — Other Ambulatory Visit (HOSPITAL_COMMUNITY): Payer: Medicare Other

## 2020-09-22 ENCOUNTER — Telehealth (HOSPITAL_COMMUNITY): Payer: Self-pay

## 2020-09-23 ENCOUNTER — Encounter (HOSPITAL_COMMUNITY): Payer: Self-pay

## 2020-09-23 ENCOUNTER — Ambulatory Visit (HOSPITAL_COMMUNITY)
Admission: EM | Admit: 2020-09-23 | Discharge: 2020-09-23 | Disposition: A | Discharge status: Home / Self Care | Payer: Medicare Other | Attending: Family Medicine | Admitting: Family Medicine

## 2020-09-23 ENCOUNTER — Other Ambulatory Visit: Payer: Self-pay

## 2020-09-23 DIAGNOSIS — J069 Acute upper respiratory infection, unspecified: Secondary | ICD-10-CM | POA: Insufficient documentation

## 2020-09-23 DIAGNOSIS — F17210 Nicotine dependence, cigarettes, uncomplicated: Secondary | ICD-10-CM | POA: Insufficient documentation

## 2020-09-23 DIAGNOSIS — Z20822 Contact with and (suspected) exposure to covid-19: Secondary | ICD-10-CM | POA: Insufficient documentation

## 2020-09-23 DIAGNOSIS — R059 Cough, unspecified: Secondary | ICD-10-CM | POA: Diagnosis not present

## 2020-09-23 LAB — SARS CORONAVIRUS 2 (TAT 6-24 HRS): SARS Coronavirus 2: NEGATIVE

## 2020-09-23 NOTE — Telephone Encounter (Signed)
Pt called me to report that her and her roommate, Tammy have symptoms of COVID. They asked if I had any testing supplies and I do not carry any of that.  They were not able to schedule anything at a pharmacy. I told them to go to urgent care for testing and to see provider if they feel bad enough. Ms ericsson said she is feeling ok, but her roommate has fever/cough. She prefers I do not come out while roommate is not feeling well.  Will check in on her next week.   Marylouise Stacks, EMT-Paramedic  09-22-2020

## 2020-09-23 NOTE — Discharge Instructions (Signed)
You have been tested for COVID-19 today. °If your test returns positive, you will receive a phone call from [facility] regarding your results. °Negative test results are not called. °Both positive and negative results area always visible on MyChart. °If you do not have a MyChart account, sign up instructions are provided in your discharge papers. °Please do not hesitate to contact us should you have questions or concerns. ° °

## 2020-09-23 NOTE — ED Triage Notes (Signed)
Pt reports having cough x  2 days; headache, body aches, runny nose and hoarse x 1 day. Pt requested COVID test as her cousin have some COVID symptoms and they live in the same house. Denies fever, sob.

## 2020-09-23 NOTE — ED Provider Notes (Signed)
[facility]   [id number] 09/23/20 Arrival Time: 9024  ASSESSMENT & PLAN:  1. Viral URI with cough      COVID-19 testing sent. See letter/work note on file for self-isolation guidelines. OTC symptom care as needed.  No orders of the defined types were placed in this encounter.    Follow-up Information    Loretha Brasil, FNP.   Specialty: Family Medicine Why: As needed. Contact information: [facility] [place] [address] [phone]               Reviewed expectations re: course of current medical issues. Questions answered. Outlined signs and symptoms indicating need for more acute intervention. Understanding verbalized. After Visit Summary given.   SUBJECTIVE: History from: patient. JOLIENE SALVADOR is a 59 y.o. female who presents with worries regarding COVID-19. Known COVID-19 contact: possibly. Recent travel: none. Reports: headache, body aches, runny nose for 1 day. Denies: fever and difficulty breathing. Normal PO intake without n/v/d.    OBJECTIVE:  Vitals:   09/23/20 1247  BP: (!) 148/54  Pulse: 63  Resp: (!) 22  Temp: 98 F (36.7 C)  TempSrc: Oral  SpO2: 97%    Recheck RR: 18 and unlabored General appearance: alert; no distress Eyes: PERRLA; EOMI; conjunctiva normal HENT: [place]; AT; with nasal congestion Neck: supple  Lungs: speaks full sentences without difficulty; unlabored Extremities: no edema Skin: warm and dry Neurologic: normal gait Psychological: alert and cooperative; normal mood and affect  Labs:  Labs Reviewed  SARS CORONAVIRUS 2 (TAT 6-24 HRS)   Allergies  Allergen Reactions  . Gabapentin Nausea And Vomiting and Other (See Comments)    POSSIBLE SHAKING? TAKING MED CURRENTLY  . Lyrica [Pregabalin] Other (See Comments)    Shaking Pt still taking lyrica--"probably will stop taking"     Past Medical History:  Diagnosis Date  . Alcohol abuse   . Alcoholic cirrhosis ([place])   . Anemia    . Anxiety   . Arthritis    "knees" (11/26/2018)  . B12 deficiency   . CAD (coronary artery disease)    a. 11/10/2014 Cath: LM nl, LAD min irregs, D1 30 ost, D2 50d, LCX 57m, OM1 80 p/m (1.5 mm vessel), OM2 31m, RCA nondom 86m-->med rx.. Demand ischemia in the setting of rapid a-fib.  . Cardiomyopathy ([place])   . Carotid artery disease ([place])    a. 01/2015 Carotid Angio: RICA 419, LICA 62I; b. 12/9796 s/p L CEA; c. 05/2019 Carotid U/S: RICA 100. RECA >50. LICA 9-21%.  . Cellulitis    lower extremities  . Chronic combined systolic and diastolic CHF (congestive heart failure) ([place])    a. 10/2014 Echo: EF 40-45%; b. 10/2018 Echo: EF 45-50%, gr2 DD; c. 11/2019 Echo: EF 50%, mild LVH, gr2 DD (restrictive), antlat HK, Nl RV fxn. Mild BAE. RVSP 59.24mmHg.  . CKD (chronic kidney disease), stage III ([place])   . Cocaine abuse ([place])   . COPD (chronic obstructive pulmonary disease) ([place])   . Critical lower limb ischemia ([place])   . Depression   . Diabetic peripheral neuropathy ([place])   . Dyspnea   . Elevated troponin    a. Chronic elevation.  [name] GERD (gastroesophageal reflux disease)   . Hyperlipemia   . Hypertension   . Hypokalemia   . Hypomagnesemia   . Hypothyroidism   . Marijuana abuse   . Narcotic abuse ([place])   . Noncompliance   . NSVT (nonsustained ventricular tachycardia) ([place])   . Obesity   .  PAF (paroxysmal atrial fibrillation) ([place])   . Paroxysmal atrial tachycardia ([place])   . Peripheral arterial disease ([place])    a. 01/2015 Angio/PTA: RSFA 99 (atherectomy/pta) - 1 vessel runoff via diff dzs peroneal; b. 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  . Pneumonia    "once or twice" (11/26/2018)  . Poorly controlled type 2 diabetes mellitus ([place])   . Renal insufficiency    a. 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  [name] Sleep apnea    "couldn't handle wearing the mask" (11/26/2018)  . Symptomatic bradycardia    a. Avoid AV blocking agent per EP. Prev req temp wire in 2017.  . Tobacco abuse     Social History   Socioeconomic History  . Marital status: Divorced    Spouse name: Not on file  . Number of children: 1  . Years of education: 68  . Highest education level: 12th grade  Occupational History  . Occupation: disabled  Tobacco Use  . Smoking status: Current Some Day Smoker    Packs/day: 1.00    Years: 44.00    Pack years: 44.00    Types: E-cigarettes, Cigarettes  . Smokeless tobacco: Former Systems developer    Types: Snuff  Vaping Use  . Vaping Use: Former  . Devices: 11/26/2018 "stopped months ago"  Substance and Sexual Activity  . Alcohol use: Yes    Comment: occ  . Drug use: Not Currently    Types: "Crack" cocaine, Marijuana, Oxycodone  . Sexual activity: Not Currently  Other Topics Concern  . Not on file  Social History Narrative   Lives in [place], in [place] with sister.  They are looking to move but don't have a place to go yet.     Social Determinants of Health   Financial Resource Strain: Low Risk   . Difficulty of Paying Living Expenses: Not very hard  Food Insecurity: No Food Insecurity  . Worried About Charity fundraiser in the Last Year: Never true  . Ran Out of Food in the Last Year: Never true  Transportation Needs: No Transportation Needs  . Lack of Transportation (Medical): No  . Lack of Transportation (Non-Medical): No  Physical Activity: Insufficiently Active  . Days of Exercise per Week: 1 day  . Minutes of Exercise per Session: 10 min  Stress:   . Feeling of Stress : Not on file  Social Connections:   . Frequency of Communication with Friends and Family: Not on file  . Frequency of Social Gatherings with Friends and Family: Not on file  . Attends Religious Services: Not on file  . Active Member of Clubs or Organizations: Not on file  . Attends Archivist Meetings: Not on file  . Marital Status: Not on file  Intimate Partner Violence:   . Fear of Current or Ex-Partner: Not on file  . Emotionally Abused: Not on file  . Physically  Abused: Not on file  . Sexually Abused: Not on file   Family History  Problem Relation Age of Onset  . Hypertension Mother   . Diabetes Mother   . Cancer Mother        breast, ovarian, colon  . Clotting disorder Mother   . Heart disease Mother   . Heart attack Mother   . Breast cancer Mother        in 69's  . Hypertension Father   . Heart disease Father   . Emphysema Sister  smoked   Past Surgical History:  Procedure Laterality Date  . ABDOMINAL AORTOGRAM N/A 06/26/2019   Procedure: ABDOMINAL AORTOGRAM;  Surgeon: Wellington Hampshire, MD;  Location: [facility];  Service: Cardiovascular;  Laterality: N/A;  . AMPUTATION Right 06/14/2017   Procedure: Right foot transmetatarsal amputation;  Surgeon: Newt Minion, MD;  Location: [facility];  Service: Orthopedics;  Laterality: Right;  . AMPUTATION Left 06/28/2019   Procedure: AMPUTATION LEFT FIFTH TOE;  Surgeon: Rosetta Posner, MD;  Location: [facility];  Service: Vascular;  Laterality: Left;  . AMPUTATION TOE Right 04/28/2017   Procedure: AMPUTATION OF RIGHT SECOND RAY;  Surgeon: Newt Minion, MD;  Location: [facility];  Service: Orthopedics;  Laterality: Right;  . CARDIAC CATHETERIZATION    . CARDIAC CATHETERIZATION N/A 01/12/2016   Procedure: Temporary Wire;  Surgeon: Minus Breeding, MD;  Location: [facility];  Service: Cardiovascular;  Laterality: N/A;  . CARDIOVERSION  ~ 02/2013   "twice"   . CAROTID ANGIOGRAM N/A 01/15/2015   Procedure: CAROTID ANGIOGRAM;  Surgeon: [name], MD;  Location[facility];  Service: Cardiovascular;  Laterality: N/A;  . DILATION AND CURETTAGE OF UTERUS  1988  . ENDARTERECTOMY Left 02/19/2015   Procedure: LEFT CAROTID ENDARTERECTOMY ;  Surgeon: Serafina Mitchell, MD;  Location: [facility];  Service: Vascular;  Laterality: Left;  . FEMORAL-TIBIAL BYPASS GRAFT Left 06/28/2019   Procedure: BYPASS GRAFT LEFT LEG FEMORAL TO ANTERIOR TIBIAL ARTERY using LEFT GREATER SAPHENOUS VEIN;  Surgeon: Rosetta Posner,  MD;  Location: [facility];  Service: Vascular;  Laterality: Left;  . LEFT HEART CATHETERIZATION WITH CORONARY ANGIOGRAM N/A 10/31/2014   Procedure: LEFT HEART CATHETERIZATION WITH CORONARY ANGIOGRAM;  Surgeon: Burnell Blanks, MD;  Location: [facility] CATH LAB;  Service: Cardiovascular;  Laterality: N/A;  . LOWER EXTREMITY ANGIOGRAM N/A 09/10/2013   Procedure: LOWER EXTREMITY ANGIOGRAM;  Surgeon: [name], MD;  Location: [facility] CATH LAB;  Service: Cardiovascular;  Laterality: N/A;  . LOWER EXTREMITY ANGIOGRAM N/A 01/15/2015   Procedure: LOWER EXTREMITY ANGIOGRAM;  Surgeon: [name], MD;  Location: [facility] C[facility] LAB;  Service: Cardiovascular;  Laterality: N/A;  . LOWER EXTREMITY ANGIOGRAPHY N/A 04/13/2017   Procedure: Lower Extremity Angiography;  Surgeon: [name], MD;  Location: [facility];  Service: Cardiovascular;  Laterality: N/A;  . LOWER EXTREMITY ANGIOGRAPHY Left 06/26/2019   Procedure: LOWER EXTREMITY ANGIOGRAPHY;  Surgeon: Wellington Hampshire, MD;  Location: [facility];  Service: Cardiovascular;  Laterality: Left;  . PERIPHERAL VASCULAR BALLOON ANGIOPLASTY Left 06/26/2019   Procedure: PERIPHERAL VASCULAR BALLOON ANGIOPLASTY;  Surgeon: Wellington Hampshire, MD;  Location: [facility];  Service: Cardiovascular;  Laterality: Left;  unable to cross lt sfa occlusion  . PERIPHERAL VASCULAR INTERVENTION  04/13/2017   Procedure: Peripheral Vascular Intervention;  Surgeon: [name], MD;  Location: [facility];  Service: Cardiovascular;;  . PRESSURE SENSOR/CARDIOMEMS N/A 02/05/2020   Procedure: PRESSURE SENSOR/CARDIOMEMS;  Surgeon: Larey Dresser, MD;  Location: [facility];  Service: Cardiovascular;  Laterality: N/A;  . VEIN HARVEST Left 06/28/2019   Procedure: LEFT LEG GREATER SAPHENOUS VEIN HARVEST;  Surgeon: Rosetta Posner, MD;  Location: [facility];  Service: Vascular;  Laterality: Left;     Vanessa Kick, MD 09/23/20 1315

## 2020-09-24 ENCOUNTER — Ambulatory Visit (HOSPITAL_COMMUNITY): Payer: Self-pay

## 2020-09-25 ENCOUNTER — Institutional Professional Consult (permissible substitution): Payer: Medicare Other | Admitting: Cardiology

## 2020-09-30 ENCOUNTER — Other Ambulatory Visit (HOSPITAL_COMMUNITY): Payer: Self-pay

## 2020-09-30 NOTE — Progress Notes (Signed)
Paramedicine Encounter    Patient ID: [name], female    DOB: [birth year], 59 y.o.   MRN: [id number]   Patient Care Team: Loretha Brasil, FNP (Inactive) as PCP - General (Family Medicine) [name], MD as PCP - Cardiology (Cardiology) [name], MD as Consulting Physician (Cardiology) [name], MD as Consulting Physician (Pulmonary Disease)  Patient Active Problem List   Diagnosis Date Noted  . Hyperlipidemia 04/03/2020  . PAD (peripheral artery disease) ([place]) 12/26/2019  . Acute renal failure ([place])   . Acute respiratory failure with hypoxia ([place]) 12/02/2019  . Acute exacerbation of CHF (congestive heart failure) ([place]) 12/01/2019  . Cellulitis in diabetic foot ([place]) 07/08/2019  . Osteomyelitis ([place]) 06/21/2019  . NICM (nonischemic cardiomyopathy) ([place]) 06/20/2019  . Non-healing ulcer ([place]) 06/20/2019  . Acute CHF (congestive heart failure) ([place]) 11/26/2018  . CHF (congestive heart failure) ([place]) 11/26/2018  . Acute respiratory failure ([place]) 10/21/2018  . AKI (acute kidney injury) ([place]) 08/18/2018  . Coagulation disorder ([place]) 08/09/2017  . Depression 07/21/2017  . At risk for adverse drug reaction 06/20/2017  . Peripheral neuropathy 06/20/2017  . Acute osteomyelitis of right foot ([place]) 06/13/2017  . S/P transmetatarsal amputation of foot, right ([place]) 06/05/2017  . 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 SIRS (systemic inflammatory response syndrome) ([place]) 04/06/2017  . CKD (chronic kidney disease), stage III ([place]) 11/24/2016  . Suspected sleep apnea 11/24/2016  . Ulcer of toe of right foot, with necrosis of bone ([place]) 10/27/2016  . Ulcer of left lower leg ([place]) 05/19/2016  . Severe obesity (BMI >= 40) ([place]) 02/24/2016  . COPD GOLD 0  02/24/2016  . Encounter for therapeutic drug monitoring 02/10/2016  . Symptomatic bradycardia  01/12/2016  . Essential hypertension 12/22/2015  . Chronic combined systolic and diastolic CHF (congestive heart failure) ([place])   . Wheeze   . 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 Tobacco abuse 10/23/2015  . Coronary artery disease   . DOE (dyspnea on exertion) 04/29/2015  . Diabetes mellitus type 2, uncontrolled ([place]) 02/08/2015  . Influenza A 02/07/2015  . Wrist fracture, left, with routine healing, subsequent encounter 02/05/2015  . Wrist fracture, left, closed, initial encounter 01/29/2015  . PAF (paroxysmal atrial fibrillation) ([place]) 01/16/2015  . Carotid arterial disease ([place]) 01/16/2015  . Claudication ([place]) 01/15/2015  . Demand ischemia ([place]) 10/29/2014  . Insomnia 02/03/2014  . COPD with acute exacerbation ([place]) 02/01/2014  . S/P peripheral artery angioplasty - [place]bo[place] atherectomy; R SFA 09/11/2013    Class: Acute  . Leg pain, bilateral 08/19/2013  . Hypothyroidism 07/31/2013  . Cellulitis 06/13/2013  . History of cocaine abuse ([place]) 06/13/2013  . Long term current use of anticoagulant therapy 05/20/2013  . Alcohol abuse   . Narcotic abuse ([place])   . Marijuana abuse   . Alcoholic cirrhosis ([place])   . DM (diabetes mellitus), type 2 with peripheral vascular complications ([name])     Current Outpatient Medications:  .  ACCU-CHEK GUIDE test strip, USE TO CHECK BLOOD SUGAR FOUR TIMES DAILY, Disp: , Rfl:  .  acetaminophen (TYLENOL) 500 MG tablet, Take 2 tablets (1,000 mg total) by mouth every 6 (six) hours as needed for mild pain., Disp: 30 tablet, Rfl: 0 .  albuterol (VENTOLIN HFA) 108 (90 Base) MCG/ACT inhaler, Inhale 2 puffs into the lungs every 6 (six) hours as needed for wheezing or shortness of breath.,  Disp: 18 g, Rfl: 0 .  amiodarone (PACERONE) 200 MG tablet, Take 0.5 tablets (100 mg total) by mouth daily., Disp: 30 tablet, Rfl: 6 .  amLODipine (NORVASC) 10 MG tablet, Take 1 tablet (10 mg total) by mouth daily., Disp: 30 tablet, Rfl: 3 .  bisoprolol (ZEBETA) 5 MG  tablet, Take 1 tablet (5 mg total) by mouth daily., Disp: 30 tablet, Rfl: 5 .  budesonide-formoterol (SYMBICORT) 160-4.5 MCG/ACT inhaler, Inhale 2 puffs into the lungs 2 (two) times daily. (Patient taking differently: Inhale 2 puffs into the lungs 2 (two) times daily as needed (shortness of breath). ), Disp: 1 Inhaler, Rfl: 2 .  cholecalciferol (VITAMIN D) 1000 units tablet, Take 1 tablet (1,000 Units total) by mouth daily., Disp:  , Rfl:  .  clopidogrel (PLAVIX) 75 MG tablet, Take 1 tablet (75 mg total) by mouth daily., Disp: 30 tablet, Rfl: 3 .  ELIQUIS 5 MG TABS tablet, TAKE 1 TABLET(5 MG) BY MOUTH TWICE DAILY, Disp: 60 tablet, Rfl: 11 .  folic acid (FOLVITE) 1 MG tablet, Take 1 tablet (1 mg total) by mouth daily., Disp: 30 tablet, Rfl: 0 .  Insulin Glargine (LANTUS SOLOSTAR) 100 UNIT/ML Solostar Pen, Inject 40 Units into the skin at bedtime., Disp: , Rfl:  .  Insulin Lispro (HUMALOG KWIKPEN) 200 UNIT/ML SOPN, Inject 8 Units into the skin 3 (three) times daily with meals. (Patient taking differently: Inject 8-15 Units into the skin 3 (three) times daily with meals. ), Disp: , Rfl:  .  JARDIANCE 10 MG TABS tablet, TAKE 1 TABLET BY MOUTH DAILY BEFORE BREAKFAST, Disp: 30 tablet, Rfl: 6 .  levothyroxine (SYNTHROID, LEVOTHROID) 50 MCG tablet, Take 1 tablet (50 mcg total) by mouth daily., Disp: 30 tablet, Rfl: 0 .  nortriptyline (PAMELOR) 50 MG capsule, Take 50 mg by mouth at bedtime., Disp: , Rfl:  .  pantoprazole (PROTONIX) 40 MG tablet, Take 1 tablet (40 mg total) by mouth daily., Disp:  , Rfl:  .  potassium chloride SA (KLOR-CON) 20 MEQ tablet, Take 1 tablet (20 mEq total) by mouth daily., Disp: 90 tablet, Rfl: 3 .  spironolactone (ALDACTONE) 25 MG tablet, Take 1 tablet (25 mg total) by mouth daily. (Patient taking differently: Take 25 mg by mouth at bedtime. ), Disp: 90 tablet, Rfl: 3 .  tiotropium (SPIRIVA HANDIHALER) 18 MCG inhalation capsule, Place 1 capsule (18 mcg total) into inhaler and inhale  every morning., Disp: 30 capsule, Rfl: 2 .  torsemide (DEMADEX) 20 MG tablet, Take 3 tablets (60 mg total) by mouth every morning AND 2 tablets (40 mg total) every evening., Disp: 150 tablet, Rfl: 3 Allergies  Allergen Reactions  . Gabapentin Nausea And Vomiting and Other (See Comments)    POSSIBLE SHAKING? TAKING MED CURRENTLY  . Lyrica [Pregabalin] Other (See Comments)    Shaking Pt still taking lyrica--"probably will stop taking"       Social History   Socioeconomic History  . Marital status: Divorced    Spouse name: Not on file  . Number of children: 1  . Years of education: 48  . Highest education level: 12th grade  Occupational History  . Occupation: disabled  Tobacco Use  . Smoking status: Current Some Day Smoker    Packs/day: 1.00    Years: 44.00    Pack years: 44.00    Types: E-cigarettes, Cigarettes  . Smokeless tobacco: Former Systems developer    Types: Snuff  Vaping Use  . Vaping Use: Former  . Devices: 11/26/2018 "stopped months  ago"  Substance and Sexual Activity  . Alcohol use: Yes    Comment: occ  . Drug use: Not Currently    Types: "Crack" cocaine, Marijuana, Oxycodone  . Sexual activity: Not Currently  Other Topics Concern  . Not on file  Social History Narrative   Lives in [place], in [place] with sister.  They are looking to move but don't have a place to go yet.     Social Determinants of Health   Financial Resource Strain: Low Risk   . Difficulty of Paying Living Expenses: Not very hard  Food Insecurity: No Food Insecurity  . Worried About Charity fundraiser in the Last Year: Never true  . Ran Out of Food in the Last Year: Never true  Transportation Needs: No Transportation Needs  . Lack of Transportation (Medical): No  . Lack of Transportation (Non-Medical): No  Physical Activity: Insufficiently Active  . Days of Exercise per Week: 1 day  . Minutes of Exercise per Session: 10 min  Stress:   . Feeling of Stress : Not on file  Social Connections:   .  Frequency of Communication with Friends and Family: Not on file  . Frequency of Social Gatherings with Friends and Family: Not on file  . Attends Religious Services: Not on file  . Active Member of Clubs or Organizations: Not on file  . Attends Archivist Meetings: Not on file  . Marital Status: Not on file  Intimate Partner Violence:   . Fear of Current or Ex-Partner: Not on file  . Emotionally Abused: Not on file  . Physically Abused: Not on file  . Sexually Abused: Not on file    Physical Exam      Future Appointments  Date Time Provider [place]  10/07/2020  1:30 PM [name], MD [facility]  10/12/2020 10:30 AM [facility] PA/NP [facility] None  11/27/2020  8:45 AM Gardiner Barefoot, DPM [facility]    BP (!) 118/0   Pulse 76   Resp 20   Wt 208 lb (94.3 kg)   LMP 11/27/2012   SpO2 97%   BMI 35.70 kg/m  CBG EMS-85  Weight yesterday-? Last visit weight-207  Pt reports she is doing better. She has a cough.  Lungs sound slight wheeze.  She got removed from [facility] due to her cousins behavior. So right now our next step is for her to get new PCP and get her meds refilled when needed.  Her CBG was low side when I arrived. She was eating a sandwich. She took too much of her insulin. I reviewed with her that she needed to be sure to eat well before taking insulin. She said her CBG was almost 500 this morning, but roommate said she ate a bag of candy last night b/c she had been smoking CBD D8/D10 and she got the munchies.  meds verified and pill box refilled.   Marylouise Stacks, [facility] Paramedic  09/30/20

## 2020-10-07 ENCOUNTER — Telehealth (HOSPITAL_COMMUNITY): Payer: Self-pay

## 2020-10-07 ENCOUNTER — Ambulatory Visit (INDEPENDENT_AMBULATORY_CARE_PROVIDER_SITE_OTHER): Payer: Medicare Other | Admitting: Cardiology

## 2020-10-07 ENCOUNTER — Encounter: Payer: Self-pay | Admitting: Cardiology

## 2020-10-07 ENCOUNTER — Other Ambulatory Visit: Payer: Self-pay

## 2020-10-07 VITALS — BP 130/72 | HR 71 | Ht 64.0 in | Wt 207.0 lb

## 2020-10-07 DIAGNOSIS — I480 Paroxysmal atrial fibrillation: Secondary | ICD-10-CM | POA: Diagnosis not present

## 2020-10-07 DIAGNOSIS — I428 Other cardiomyopathies: Secondary | ICD-10-CM

## 2020-10-07 DIAGNOSIS — I739 Peripheral vascular disease, unspecified: Secondary | ICD-10-CM

## 2020-10-07 DIAGNOSIS — Z720 Tobacco use: Secondary | ICD-10-CM

## 2020-10-07 DIAGNOSIS — Z89431 Acquired absence of right foot: Secondary | ICD-10-CM | POA: Diagnosis not present

## 2020-10-07 NOTE — Telephone Encounter (Signed)
Reached out to pt-she is spending several days with her sister. The roommate did her pill box so no visit is needed today.  She has clinic appointment on Monday, will need to be set up with [facility].  I asked jenna to set it up and sch her ride for her Monday appointment.   Marylouise Stacks, EMT-Paramedic  10/07/20

## 2020-10-07 NOTE — Patient Instructions (Signed)
Medication Instructions:  Your physician recommends that you continue on your current medications as directed. Please refer to the Current Medication list given to you today.  *If you need a refill on your cardiac medications before your next appointment, please call your pharmacy*   Lab Work: None ordered   Testing/Procedures: None ordered   Follow-Up: At [facility], you and your health needs are our priority.  As part of our continuing mission to provide you with exceptional heart care, we have created designated Provider Care Teams.  These Care Teams include your primary Cardiologist (physician) and Advanced Practice Providers (APPs -  Physician Assistants and Nurse Practitioners) who all work together to provide you with the care you need, when you need it.  Your next appointment:    as needed   The format for your next appointment:   In Person  Provider:   Lars Mage, MD    Thank you for choosing [facility]!!

## 2020-10-07 NOTE — Progress Notes (Signed)
Watchman/Electrophysiology consult Note   Date:  10/07/2020   ID:  [name], DOB 06/01/1961, MRN [id number]  PCP:  Loretha Brasil, FNP (Inactive)  Primary Electrophysiologist: Lars Mage, MD Referring Physician: Lyda Jester, PA-C  Chief complaint: atrial fibrillation and coagulation management; Watchman implant    History of Present Illness: [name] is a 59 y.o. female referred by Ellen Henri, PA-C for evaluation of atrial fibrillation and stroke prevention. She has paroxysmal atrial fibrillation as well as peripheral artery disease, carotid stenosis post left carotid endarterectomy, chronic systolic heart failure with CardioMEMS device in place, prior substance abuse.  The patient has been evaluated by their referring physician and is felt to be a poor candidate for long term [place] due to recurrent mechanical falls.  She therefore presents today for Watchman evaluation.   She continues to smoke heavily. She is also unsure of some of her medications. She does tell me that she takes her Eliquis twice a day. She does not have any history of significant bleeds although she does bump into things and has fallen in the past. Today she tells me she has 2 skin tears on the left with significant bruising on her left arm.  Today, she denies symptoms of palpitations, chest pain, shortness of breath, orthopnea, PND, lower extremity edema, claudication, dizziness, presyncope, syncope, bleeding, or neurologic sequela. The patient is tolerating medications without difficulties and is otherwise without complaint today.    Past Medical History:  Diagnosis Date  . Alcohol abuse   . Alcoholic cirrhosis ([employer])   . Anemia   . Anxiety   . Arthritis    "knees" (11/26/2018)  . B12 deficiency   . CAD (coronary artery disease)    a. 11/10/2014 Cath: LM nl, LAD min irregs, D1 30 ost, D2 50d, LCX 43m, OM1 80 p/m (1.5 mm vessel), OM2 10m, RCA nondom 58m-->med rx.. Demand ischemia in  the setting of rapid a-fib.  . Cardiomyopathy ([place])   . Carotid artery disease ([place])    a. 01/2015 Carotid Angio: RICA 093, LICA 23F; b. 03/7321 s/p L CEA; c. 05/2019 Carotid U/S: RICA 100. RECA >50. LICA 0-25%.  . Cellulitis    lower extremities  . Chronic combined systolic and diastolic CHF (congestive heart failure) ([place])    a. 10/2014 Echo: EF 40-45%; b. 10/2018 Echo: EF 45-50%, gr2 DD; c. 11/2019 Echo: EF 50%, mild LVH, gr2 DD (restrictive), antlat HK, Nl RV fxn. Mild BAE. RVSP 59.48mmHg.  . CKD (chronic kidney disease), stage III ([place])   . Cocaine abuse ([place])   . COPD (chronic obstructive pulmonary disease) ([place])   . Critical lower limb ischemia ([place])   . Depression   . Diabetic peripheral neuropathy ([place])   . Dyspnea   . Elevated troponin    a. Chronic elevation.  [name] GERD (gastroesophageal reflux disease)   . Hyperlipemia   . Hypertension   . Hypokalemia   . Hypomagnesemia   . Hypothyroidism   . Marijuana abuse   . Narcotic abuse ([place])   . Noncompliance   . NSVT (nonsustained ventricular tachycardia) ([place])   . Obesity   . PAF (paroxysmal atrial fibrillation) ([place])   . Paroxysmal atrial tachycardia ([place])   . Peripheral arterial disease ([place])    a. 01/2015 Angio/PTA: RSFA 99 (atherectomy/pta) - 1 vessel runoff via diff dzs peroneal; b. 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  . Pneumonia    "once or twice" (11/26/2018)  .  Poorly controlled type 2 diabetes mellitus ([place])   . Renal insufficiency    a. 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  [name] Sleep apnea    "couldn't handle wearing the mask" (11/26/2018)  . Symptomatic bradycardia    a. Avoid AV blocking agent per EP. Prev req temp wire in 2017.  . Tobacco abuse    Past Surgical History:  Procedure Laterality Date  . ABDOMINAL AORTOGRAM N/A 06/26/2019   Procedure: ABDOMINAL AORTOGRAM;  Surgeon: Wellington Hampshire, MD;  Location: [facility];  Service: Cardiovascular;  Laterality: N/A;  . AMPUTATION Right 06/14/2017     Procedure: Right foot transmetatarsal amputation;  Surgeon: Newt Minion, MD;  Location: [employer];  Service: Orthopedics;  Laterality: Right;  . AMPUTATION Left 06/28/2019   Procedure: AMPUTATION LEFT FIFTH TOE;  Surgeon: Rosetta Posner, MD;  Location: [facility];  Service: Vascular;  Laterality: Left;  . AMPUTATION TOE Right 04/28/2017   Procedure: AMPUTATION OF RIGHT SECOND RAY;  Surgeon: Newt Minion, MD;  Location: [facility];  Service: Orthopedics;  Laterality: Right;  . CARDIAC CATHETERIZATION    . CARDIAC CATHETERIZATION N/A 01/12/2016   Procedure: Temporary Wire;  Surgeon: Minus Breeding, MD;  Location: [facility];  Service: Cardiovascular;  Laterality: N/A;  . CARDIOVERSION  ~ 02/2013   "twice"   . CAROTID ANGIOGRAM N/A 01/15/2015   Procedure: CAROTID ANGIOGRAM;  Surgeon: [name], MD;  Location: [facility] C[facility] LAB;  Service: Cardiovascular;  Laterality: N/A;  . DILATION AND CURETTAGE OF UTERUS  1988  . ENDARTERECTOMY Left 02/19/2015   Procedure: LEFT CAROTID ENDARTERECTOMY ;  Surgeon: Serafina Mitchell, MD;  Location: [place] [facility];  Service: Vascular;  Laterality: Left;  . FEMORAL-TIBIAL BYPASS GRAFT Left 06/28/2019   Procedure: BYPASS GRAFT LEFT LEG FEMORAL TO ANTERIOR TIBIAL ARTERY using LEFT GREATER SAPHENOUS VEIN;  Surgeon: Rosetta Posner, MD;  Location: [place];  Service: Vascular;  Laterality: Left;  . LEFT HEART CATHETERIZATION WITH CORONARY ANGIOGRAM N/A 10/31/2014   Procedure: LEFT HEART CATHETERIZATION WITH CORONARY ANGIOGRAM;  Surgeon: Burnell Blanks, MD;  Location[facility] CATH LAB;  Service: Cardiovascular;  Laterality: N/A;  . LOWER EXTREMITY ANGIOGRAM N/A 09/10/2013   Procedure: LOWER EXTREMITY ANGIOGRAM;  Surgeon: [name], MD;  Location: [facility] CATH LAB;  Service: Cardiovascular;  Laterality: N/A;  . LOWER EXTREMITY ANGIOGRAM N/A 01/15/2015   Procedure: LOWER EXTREMITY ANGIOGRAM;  Surgeon: [name], MD;  Location: [facility] CATH LAB;  Service: Cardiovascular;  Laterality: N/A;  .  LOWER EXTREMITY ANGIOGRAPHY N/A 04/13/2017   Procedure: Lower Extremity Angiography;  Surgeon: [name], MD;  Location: [place] [facility];  Service: Cardiovascular;  Laterality: N/A;  . LOWER EXTREMITY ANGIOGRAPHY Left 06/26/2019   Procedure: LOWER EXTREMITY ANGIOGRAPHY;  Surgeon: Wellington Hampshire, MD;  Location: [place] [facility];  Service: Cardiovascular;  Laterality: Left;  . PERIPHERAL VASCULAR BALLOON ANGIOPLASTY Left 06/26/2019   Procedure: PERIPHERAL VASCULAR BALLOON ANGIOPLASTY;  Surgeon: Wellington Hampshire, MD;  Location: [facility];  Service: Cardiovascular;  Laterality: Left;  unable to cross lt sfa occlusion  . PERIPHERAL VASCULAR INTERVENTION  04/13/2017   Procedure: Peripheral Vascular Intervention;  Surgeon: [name], MD;  Location: [place] [facility];  Service: Cardiovascular;;  . PRESSURE SENSOR/CARDIOMEMS N/A 02/05/2020   Procedure: PRESSURE SENSOR/CARDIOMEMS;  Surgeon: Larey Dresser, MD;  Location: [facility];  Service: Cardiovascular;  Laterality: N/A;  . VEIN HARVEST Left 06/28/2019   Procedure: LEFT LEG GREATER  SAPHENOUS VEIN HARVEST;  Surgeon: Rosetta Posner, MD;  Location: [facility] OR;  Service: Vascular;  Laterality: Left;     Current Outpatient Medications  Medication Sig Dispense Refill  . ACCU-CHEK GUIDE test strip USE TO CHECK BLOOD SUGAR FOUR TIMES DAILY    . albuterol (VENTOLIN HFA) 108 (90 Base) MCG/ACT inhaler Inhale 2 puffs into the lungs every 6 (six) hours as needed for wheezing or shortness of breath. 18 g 0  . amiodarone (PACERONE) 200 MG tablet Take 0.5 tablets (100 mg total) by mouth daily. 30 tablet 6  . amLODipine (NORVASC) 10 MG tablet Take 1 tablet (10 mg total) by mouth daily. 30 tablet 3  . bisoprolol (ZEBETA) 5 MG tablet Take 1 tablet (5 mg total) by mouth daily. 30 tablet 5  . budesonide-formoterol (SYMBICORT) 160-4.5 MCG/ACT inhaler Inhale 2 puffs into the lungs 2 (two) times daily. 1 Inhaler 2  . celecoxib (CELEBREX)  200 MG capsule Take 200 mg by mouth 2 (two) times daily.    . cholecalciferol (VITAMIN D) 1000 units tablet Take 1 tablet (1,000 Units total) by mouth daily.    . clopidogrel (PLAVIX) 75 MG tablet Take 1 tablet (75 mg total) by mouth daily. 30 tablet 3  . D3-1000 25 MCG (1000 UT) capsule Take 1,000 Units by mouth daily.    . diclofenac Sodium (VOLTAREN) 1 % GEL as needed.    [name] ELIQUIS 5 MG TABS tablet TAKE 1 TABLET(5 MG) BY MOUTH TWICE DAILY 60 tablet 11  . folic acid (FOLVITE) 1 MG tablet Take 1 tablet (1 mg total) by mouth daily. 30 tablet 0  . Insulin Glargine (LANTUS SOLOSTAR) 100 UNIT/ML Solostar Pen Inject 40 Units into the skin at bedtime. (Patient taking differently: Inject 30 Units into the skin at bedtime. )    . Insulin Lispro (HUMALOG KWIKPEN) 200 UNIT/ML SOPN Inject 8 Units into the skin 3 (three) times daily with meals.    [name] JARDIANCE 10 MG TABS tablet TAKE 1 TABLET BY MOUTH DAILY BEFORE BREAKFAST 30 tablet 6  . levothyroxine (SYNTHROID, LEVOTHROID) 50 MCG tablet Take 1 tablet (50 mcg total) by mouth daily. 30 tablet 0  . losartan (COZAAR) 100 MG tablet Take 100 mg by mouth daily.    . nortriptyline (PAMELOR) 50 MG capsule Take 50 mg by mouth at bedtime.    . pantoprazole (PROTONIX) 40 MG tablet Take 1 tablet (40 mg total) by mouth daily.    . potassium chloride SA (KLOR-CON) 20 MEQ tablet Take 1 tablet (20 mEq total) by mouth daily. 90 tablet 3  . spironolactone (ALDACTONE) 25 MG tablet Take 1 tablet (25 mg total) by mouth daily. 90 tablet 3  . tiotropium (SPIRIVA HANDIHALER) 18 MCG inhalation capsule Place 1 capsule (18 mcg total) into inhaler and inhale every morning. 30 capsule 2  . torsemide (DEMADEX) 20 MG tablet Take 3 tablets (60 mg total) by mouth every morning AND 2 tablets (40 mg total) every evening. 150 tablet 3   No current facility-administered medications for this visit.    Allergies:   Gabapentin and Lyrica [pregabalin]   Social History:  The patient  reports  that she has been smoking e-cigarettes and cigarettes. She has a 44.00 pack-year smoking history. She has quit using smokeless tobacco.  Her smokeless tobacco use included snuff. She reports current alcohol use. She reports previous drug use. Drugs: "Crack" cocaine, Marijuana, and Oxycodone.   Family History:  The patient's  family history includes Breast cancer in her  mother; Cancer in her mother; Clotting disorder in her mother; Diabetes in her mother; Emphysema in her sister; Heart attack in her mother; Heart disease in her father and mother; Hypertension in her father and mother.    ROS:  Please see the history of present illness.   All other systems are reviewed and negative.    PHYSICAL EXAM: VS:  BP 130/72   Pulse 71   Ht 5\' 4"  (1.626 m)   Wt 207 lb (93.9 kg)   LMP 11/27/2012   SpO2 97%   BMI 35.53 kg/m  , BMI Body mass index is 35.53 kg/m.   GEN: Well nourished, well developed, in no acute distress. Strong odor of tobacco smoke. Obese. HEENT: normal  Neck: no JVD, carotid bruits, or masses Cardiac: RRR; no murmurs, rubs, or gallops,no edema  Respiratory:  clear to auscultation bilaterally, normal work of breathing GI: soft, nontender, nondistended, + BS MS: Unstable gait Skin: warm and dry. Left upper extremity with scattered bruising and 2 skin tears with surrounding ecchymosis. Neuro:  Strength and sensation are intact Psych: euthymic mood, full affect    Recent Labs: 12/05/2019: Magnesium 2.4 09/10/2020: ALT 88; B Natriuretic Peptide 178.7; BUN 53; Creatinine, Ser 2.05; Hemoglobin 13.1; Platelets 313; Potassium 4.8; Sodium 141; TSH 4.359    Lipid Panel     Component Value Date/Time   CHOL 134 01/28/2020 1010   TRIG 112 01/28/2020 1010   HDL 48 01/28/2020 1010   CHOLHDL 2.8 01/28/2020 1010   VLDL 22 01/28/2020 1010   LDLCALC 64 01/28/2020 1010     Wt Readings from Last 3 Encounters:  10/07/20 207 lb (93.9 kg)  09/30/20 208 lb (94.3 kg)  09/16/20 207 lb  (93.9 kg)      December 02, 2019 echo personally reviewed Left ventricular function 50%, low normal Mild LVH Right ventricular function normal    ASSESSMENT AND PLAN:  1.  Paroxysmal atrial fibrillation I have seen [name] in the office today who is being considered for a Watchman left atrial appendage closure device. I believe they Price benefit from this procedure given their history of atrial fibrillation, CHA2DS2-VASc score of 5 and unadjusted ischemic stroke rate of 7.2% per year. Unfortunately, the patient is not felt to be a long term anticoagulation candidate secondary to frequent mechanical falls. Despite that, she is tolerating her Eliquis right now.  Unfortunately, [name] has a significant medical history and multiple medical comorbidities that I believe prevent safe implant of a watchman device. Specifically, her severe peripheral vascular disease, ongoing tobacco abuse, cirrhosis, COPD and morbid obesity prevent safe implant. I have discussed this in detail with the patient and her family who is with her today and they are in complete understanding.  I have encouraged [name] to continue taking the Eliquis twice daily to help reduce her risk of stroke. I do believe her to be at a significantly increased risk of stroke given her ongoing tobacco use and extensive peripheral vascular disease.  The published clinical data on the safety and effectiveness of WATCHMAN include but are not limited to the following: - Holmes DR, Mechele Claude, Sick P et al. for the PROTECT AF Investigators. Percutaneous closure of the left atrial appendage versus warfarin therapy for prevention of stroke in patients with atrial fibrillation: a randomised non-inferiority trial. Lancet 2009; 374: 534-42. Mechele Claude, Doshi SK, Abelardo Diesel D et al. on behalf of the PROTECT AF Investigators. Percutaneous Left Atrial Appendage Closure for Stroke  Prophylaxis in Patients With Atrial Fibrillation 2.3-Year  Follow-up of the PROTECT AF (Watchman Left Atrial Appendage System for Embolic Protection in Patients With Atrial Fibrillation) Trial. Circulation 2013; 127:720-729. - Alli O, Doshi S,  Kar S, Reddy VY, Sievert H et al. Quality of Life Assessment in the Randomized PROTECT AF (Percutaneous Closure of the Left Atrial Appendage Versus Warfarin Therapy for Prevention of Stroke in Patients With Atrial Fibrillation) Trial of Patients at Risk for Stroke With Nonvalvular Atrial Fibrillation. J Am Coll Cardiol 2013; 65:4650-3. Vertell Limber DR, Tarri Abernethy, Price M, [place], Sievert H, Doshi S, Huber K, Reddy V. Prospective randomized evaluation of the Watchman left atrial appendage Device in patients with atrial fibrillation versus long-term warfarin therapy; the PREVAIL trial. Journal of the [employer] of Cardiology, Vol. 4, No. 1, 2014, 1-11. - Kar S, Doshi SK, Sadhu A, Horton R, Osorio J et al. Primary outcome evaluation of a next-generation left atrial appendage closure device: results from the PINNACLE FLX trial. Circulation 2021;143(18)1754-1762.    Signed, Hilton Cork. Quentin Ore, MD, [facility] Cardiac Electrophysiology  [facility] [address] [place] [address] [phone] (office) [phone] (fax)

## 2020-10-11 NOTE — Progress Notes (Signed)
Patient ID: [name], female   DOB: [birth year], 59 y.o.   MRN: [id number]    Advanced Heart Failure Clinic Note   PCP: Waiting to establish with PCP  HF Cardiology: Dr. Aundra Dubin PV: Dr. Gwenlyn Found  [name] is a 59 y.o. with history of PAD, carotid stenosis s/p left CEA, relatively mild CAD, chronic systolic CHF, paroxysmal atrial fibrillation and prior substance abuse presents for CHF clinic followup.  She was admitted in 1/17 with acute hypoxemic respiratory failure in the setting of atrial fibrillation/RVR and volume overload.  She was initially intubated.  She converted back to NSR with amiodarone gtt.  She was treated with IV Lasix, steroids, bronchodilators.  She developed AKI and losartan was stopped.    She was admitted in 3/17 with symptomatic bradycardia.  She was supposed to stop diltiazem after this admission but continued to take it.  She presented later in 3/17, again with symptomatic bradycardia (junctional bradycardia), hypotension, and AKI.  Diltiazem and bisoprolol were stopped.  HR recovered and she has had no problems since.  ACEI was stopped with AKI.   In 5/18, she had peripheral angiography with atherectomy of right SFA.  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 7/18 ABIs showed 0.65 on right, 0.31 on left.  In 8/20, she had peripheral angiography showing totally occluded left SFA.  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 Donnetta Hutching.  ABIs in 2/21 were 0.44 on right, 0.99 on left.   Most recent echo in 1/21 showed EF 50%, mild LVH, normal RV, PASP 60 mmHg.   Today she returns for HF follow up.Overall feeling ok but having ongoing leg pain.  Not very active due to leg pain. Denies SOB/PND/Orthopnea. Appetite ok. No fever or chills. She has not been weighing consistently. Taking all medications. Smoking 1/2-1 pack per day. Lives with her cousin. Followed by HF Paramedicine with medication management.      Cardiomems: Reading : Last reading 17 on 10/07/20   Labs (1/17): K 5.2, creatinine 1.4, AST 43, ALT normal Labs (2/17): K 4.2, creatinine 1.3, BNP 607, TSH normal Labs (3/17): K 4.2, creatinine 1.45, AST/ALT normal, HCT 28.7 Labs (4/17): K 4.4, creatinine 1.61 Labs (08/31/2016): K 4.5 Creatinine 1.43  Labs (11/17): K 4.2, creatinine 1.4 Labs (9/18): K 4.1, creatinine 1.1, TSH normal Labs 04/13/2018: K 5 Creatinine 1.75 Labs (2/21): K 4.1, creatinine 1.58 Labs (01/28/20): K 4.1 Creatinine 1.4  Labs (06/08/20): K 4.3, creatinine 1.67   PMH: 1. Carotid stenosis: Known occluded right carotid.  Left CEA in 4/16.  2. CAD: LHC in 12/15 with 80% stenosis in small OM1, nonobstructive disease in other territories.  3. Chronic systolic CHF: Nonischemic cardiomyopathy (?due to ETOH or prior drug abuse).  - Echo (1/17) with EF 45%, mild LV hypertrophy, moderate diastolic dysfunction, inferolateral severe hypokinesis, mildly decreased RV systolic function.  - Echo (1/18): EF 40-45%, mild LVH, normal RV size and systolic function.  - Echo (1/21): EF 50%, mild LVH, normal RV, PASP 60 mmHg.  4. Atrial fibrillation: Paroxysmal.   5. Type II diabetes 6. CKD: Stage III. 7. COPD: Quit smoking 1/17. On home oxygen.  8. 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  No longer drinks.  9. Hypothyroidism 10. PAD: Atherectomy SFA in 2014 (Dr Gwenlyn Found).  Peripheral arterial dopplers (2/17) with focal 75-99% proximal right SFA stenosis, occluded mid-distal right SFA, chronic occlusion of all runoff arteries on right. - In  5/18, she had peripheral angiography with atherectomy of right SFA.   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  - 7/18 ABIs showed 0.65 on right, 0.31 on left.   - Peripheral angiography 8/20: Totally occluded left SFA.  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 Donnetta Hutching.   - ABIs (2/21): 0.44 R, 0.99 L.  11. Anemia 12. Prior  cocaine abuse.  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  14. OSA: Has not been able to tolerate CPAP.   SH: Lives with sister.  Prior cocaine abuse.  Prior ETOH abuse.  Quit smoking in 1/17, restarted, and quit again in 4/19.  FH: CAD  Review of systems complete and found to be negative unless listed in HPI.    Current Outpatient Medications  Medication Sig Dispense Refill  . ACCU-CHEK GUIDE test strip USE TO CHECK BLOOD SUGAR FOUR TIMES DAILY    . albuterol (VENTOLIN HFA) 108 (90 Base) MCG/ACT inhaler Inhale 2 puffs into the lungs every 6 (six) hours as needed for wheezing or shortness of breath. 18 g 0  . amiodarone (PACERONE) 200 MG tablet Take 0.5 tablets (100 mg total) by mouth daily. 30 tablet 6  . amLODipine (NORVASC) 10 MG tablet Take 1 tablet (10 mg total) by mouth daily. 30 tablet 3  . bisoprolol (ZEBETA) 5 MG tablet Take 1 tablet (5 mg total) by mouth daily. 30 tablet 5  . budesonide-formoterol (SYMBICORT) 160-4.5 MCG/ACT inhaler Inhale 2 puffs into the lungs 2 (two) times daily. 1 Inhaler 2  . celecoxib (CELEBREX) 200 MG capsule Take 200 mg by mouth 2 (two) times daily.    . cholecalciferol (VITAMIN D) 1000 units tablet Take 1 tablet (1,000 Units total) by mouth daily.    . clopidogrel (PLAVIX) 75 MG tablet Take 1 tablet (75 mg total) by mouth daily. 30 tablet 3  . D3-1000 25 MCG (1000 UT) capsule Take 1,000 Units by mouth daily.    . diclofenac Sodium (VOLTAREN) 1 % GEL as needed.    [name] ELIQUIS 5 MG TABS tablet TAKE 1 TABLET(5 MG) BY MOUTH TWICE DAILY 60 tablet 11  . folic acid (FOLVITE) 1 MG tablet Take 1 tablet (1 mg total) by mouth daily. 30 tablet 0  . Insulin Glargine (LANTUS SOLOSTAR) 100 UNIT/ML Solostar Pen Inject 40 Units into the skin at bedtime. (Patient taking differently: Inject 30 Units into the skin at bedtime. )    . Insulin Lispro (HUMALOG KWIKPEN) 200 UNIT/ML SOPN Inject 8 Units into the skin 3 (three) times daily with meals.      [name] JARDIANCE 10 MG TABS tablet TAKE 1 TABLET BY MOUTH DAILY BEFORE BREAKFAST 30 tablet 6  . levothyroxine (SYNTHROID, LEVOTHROID) 50 MCG tablet Take 1 tablet (50 mcg total) by mouth daily. 30 tablet 0  . losartan (COZAAR) 100 MG tablet Take 100 mg by mouth daily.    . nortriptyline (PAMELOR) 50 MG capsule Take 50 mg by mouth at bedtime.    . pantoprazole (PROTONIX) 40 MG tablet Take 1 tablet (40 mg total) by mouth daily.    . potassium chloride SA (KLOR-CON) 20 MEQ tablet Take 1 tablet (20 mEq total) by mouth daily. 90 tablet 3  . spironolactone (ALDACTONE) 25 MG tablet Take 1 tablet (25 mg total) by mouth daily. 90 tablet 3  . tiotropium (SPIRIVA HANDIHALER) 18 MCG inhalation capsule Place 1 capsule (18 mcg total)  into inhaler and inhale every morning. 30 capsule 2  . torsemide (DEMADEX) 20 MG tablet Take 3 tablets (60 mg total) by mouth every morning AND 2 tablets (40 mg total) every evening. 150 tablet 3   No current facility-administered medications for this encounter.   Vitals:   10/12/20 1035  BP: 138/76  Pulse: 79  SpO2: 98%  Weight: 96.9 kg (213 lb 9.6 oz)     Wt Readings from Last 3 Encounters:  10/12/20 96.9 kg (213 lb 9.6 oz)  10/07/20 93.9 kg (207 lb)  09/30/20 94.3 kg (208 lb)    PHYSICAL EXAM:  General:  . No resp difficulty. Arrived in a wheel chair.  HEENT: normal Neck: supple. no JVD. Carotids 2+ bilat; no bruits. No lymphadenopathy or thryomegaly appreciated. Cor: PMI nondisplaced. Regular rate & rhythm. No rubs, gallops or murmurs. Lungs: clear Abdomen: soft, nontender, nondistended. No hepatosplenomegaly. No bruits or masses. Good bowel sounds. Extremities: no cyanosis, clubbing, rash, edema Neuro: alert & orientedx3, cranial nerves grossly intact. moves all 4 extremities w/o difficulty. Affect pleasant   Assessment/Plan: 1. Chronic diastolic CHF: EF 02% on last echo in 1/21. Has had presumed nonischemic given LHC in 12/15 showing only 80% stenosis in small  OM1.  EF as low as 40-45% in the past.   - Has Cardiomems: dPAP goal set to 20. Last reading 17 mm hg on 10/07/20. Having trouble with readings. Asked her to call patient assistance number.  NYHA III.  Volume status stable. Continue torsemide to 60 mg qam/ 40 mg qpm. - Continue spironolactone 25 mg daily.  - stop potassium  2. Atrial fibrillation: Paroxysmal.  - Regular on exam.  - Continue amiodarone 100 mg daily -   She is on levothyroxine, followed by PCP.  Needs regular eye exams. 3. PAD: Claudication bilaterally.  Not candidate for cilostazol with CHF.  She has had a left fem-pop bypass in 8/20.  ABIs in 2/21 were very abnormal on right.   - She is still on Plavix along with Eliquis. No bleeding issues.  - She is followed by Drs Gwenlyn Found and Early for PAD.  - Continue atorvastatin.  4. COPD: Continues to smoke 1ppd. Discussed smoking cessation.  5. H/o cirrhosis: Likely from ETOH, now abstinent.  6. OSA: Not able to tolerate CPAP.  -7. CKD stage III: Recent BMET stable.  8. HTN: Controlled on current regimen   9.Type 2 diabetes: - Continue Jardiance 10 mg daily.  10. CAD: LHC in 12/15 with 80% stenosis in small OM1, nonobstructive disease in other territories. -No chest pain.  - Continue Plavix, statin + SGLT2   Follow up in 3 months. Continue HF Paramedicine. I think she needs to get back on Bubble Packs for her medication.     Darrick Grinder, NP  10/12/2020

## 2020-10-12 ENCOUNTER — Telehealth (HOSPITAL_COMMUNITY): Payer: Self-pay | Admitting: Licensed Clinical Social Worker

## 2020-10-12 ENCOUNTER — Other Ambulatory Visit (HOSPITAL_COMMUNITY): Payer: Self-pay

## 2020-10-12 ENCOUNTER — Other Ambulatory Visit: Payer: Self-pay

## 2020-10-12 ENCOUNTER — Ambulatory Visit (HOSPITAL_COMMUNITY)
Admission: RE | Admit: 2020-10-12 | Discharge: 2020-10-12 | Disposition: A | Discharge status: Home / Self Care | Payer: Medicare Other | Source: Ambulatory Visit | Attending: Cardiology | Admitting: Cardiology

## 2020-10-12 ENCOUNTER — Encounter (HOSPITAL_COMMUNITY): Payer: Self-pay

## 2020-10-12 VITALS — BP 138/76 | HR 79 | Wt 213.6 lb

## 2020-10-12 DIAGNOSIS — Z7951 Long term (current) use of inhaled steroids: Secondary | ICD-10-CM | POA: Diagnosis not present

## 2020-10-12 DIAGNOSIS — Z7901 Long term (current) use of anticoagulants: Secondary | ICD-10-CM | POA: Insufficient documentation

## 2020-10-12 DIAGNOSIS — Z720 Tobacco use: Secondary | ICD-10-CM

## 2020-10-12 DIAGNOSIS — N183 Chronic kidney disease, stage 3 unspecified: Secondary | ICD-10-CM | POA: Diagnosis not present

## 2020-10-12 DIAGNOSIS — F17210 Nicotine dependence, cigarettes, uncomplicated: Secondary | ICD-10-CM | POA: Diagnosis not present

## 2020-10-12 DIAGNOSIS — E1151 Type 2 diabetes mellitus with diabetic peripheral angiopathy without gangrene: Secondary | ICD-10-CM | POA: Insufficient documentation

## 2020-10-12 DIAGNOSIS — I2510 Atherosclerotic heart disease of native coronary artery without angina pectoris: Secondary | ICD-10-CM | POA: Insufficient documentation

## 2020-10-12 DIAGNOSIS — G4733 Obstructive sleep apnea (adult) (pediatric): Secondary | ICD-10-CM | POA: Insufficient documentation

## 2020-10-12 DIAGNOSIS — I739 Peripheral vascular disease, unspecified: Secondary | ICD-10-CM

## 2020-10-12 DIAGNOSIS — F1011 Alcohol abuse, in remission: Secondary | ICD-10-CM | POA: Diagnosis not present

## 2020-10-12 DIAGNOSIS — I130 Hypertensive heart and chronic kidney disease with heart failure and stage 1 through stage 4 chronic kidney disease, or unspecified chronic kidney disease: Secondary | ICD-10-CM | POA: Diagnosis not present

## 2020-10-12 DIAGNOSIS — Z794 Long term (current) use of insulin: Secondary | ICD-10-CM | POA: Diagnosis not present

## 2020-10-12 DIAGNOSIS — I5042 Chronic combined systolic (congestive) and diastolic (congestive) heart failure: Secondary | ICD-10-CM

## 2020-10-12 DIAGNOSIS — I480 Paroxysmal atrial fibrillation: Secondary | ICD-10-CM | POA: Diagnosis not present

## 2020-10-12 DIAGNOSIS — Z8719 Personal history of other diseases of the digestive system: Secondary | ICD-10-CM | POA: Diagnosis not present

## 2020-10-12 DIAGNOSIS — Z79899 Other long term (current) drug therapy: Secondary | ICD-10-CM | POA: Insufficient documentation

## 2020-10-12 DIAGNOSIS — I6522 Occlusion and stenosis of left carotid artery: Secondary | ICD-10-CM | POA: Diagnosis not present

## 2020-10-12 DIAGNOSIS — I428 Other cardiomyopathies: Secondary | ICD-10-CM

## 2020-10-12 DIAGNOSIS — Z79890 Hormone replacement therapy (postmenopausal): Secondary | ICD-10-CM | POA: Insufficient documentation

## 2020-10-12 DIAGNOSIS — E1122 Type 2 diabetes mellitus with diabetic chronic kidney disease: Secondary | ICD-10-CM | POA: Diagnosis not present

## 2020-10-12 DIAGNOSIS — Z791 Long term (current) use of non-steroidal anti-inflammatories (NSAID): Secondary | ICD-10-CM | POA: Diagnosis not present

## 2020-10-12 DIAGNOSIS — Z7902 Long term (current) use of antithrombotics/antiplatelets: Secondary | ICD-10-CM | POA: Insufficient documentation

## 2020-10-12 NOTE — Patient Instructions (Signed)
STOP Potassium  Your physician has requested that you have an echocardiogram. Echocardiography is a painless test that uses sound waves to create images of your heart. It provides your doctor with information about the size and shape of your heart and how well your heart's chambers and valves are working. This procedure takes approximately one hour. There are no restrictions for this procedure.   Your physician recommends that you schedule a follow-up appointment in: 3 months with an echocardiogram  If you have any questions or concerns before your next appointment please send [place] a message through [place] or call our office at [phone].    TO LEAVE A MESSAGE FOR THE NURSE SELECT OPTION 2, PLEASE LEAVE A MESSAGE INCLUDING: . YOUR NAME . DATE OF BIRTH . CALL BACK NUMBER . REASON FOR CALL**this is important as we prioritize the call backs  YOU WILL RECEIVE A CALL BACK THE SAME DAY AS LONG AS YOU CALL BEFORE 4:00 PM

## 2020-10-12 NOTE — Telephone Encounter (Signed)
Community Paramedic informed CSW that pt car is not working and needs ride to clinic appt today.  CSW able to arrange through [facility] [employer]- pt informed.  Will continue to follow and assist as needed  Jorge Ny, [facility] Desk#: [phone] Cell#: [phone]

## 2020-10-12 NOTE — Progress Notes (Signed)
Paramedicine Encounter   Patient ID: [name] , female,   DOB: April 22, 1961,58 y.o.,  MRN: [id number]   Met patient in clinic today with provider.  Weight @ clinic-213 Weight @ [facility]-887 B/p-148/96 HR-79  SP02-98%  cardiomems report numbers look good.  Last report was on 11/24-she will get back on the cardiomems daily routine.   Pt reports she took her meds this morning.  She denies missing any doses of her meds.  She spent thanksgiving with her sister.  Her weights at home are running stable.  Will work on getting her back on bubble packs for better compliance.  Still smoking up to 1 pack a day.  Stop potassium--- Next visit will include ECHO.  F/u in 3 months.  She has appoint for new PCP soon.  I will see her on Wednesday for med rec.    [name], [place] 10/12/2020

## 2020-10-15 ENCOUNTER — Other Ambulatory Visit (HOSPITAL_COMMUNITY): Payer: Self-pay | Admitting: *Deleted

## 2020-10-15 ENCOUNTER — Other Ambulatory Visit (HOSPITAL_COMMUNITY): Payer: Self-pay

## 2020-10-15 DIAGNOSIS — I480 Paroxysmal atrial fibrillation: Secondary | ICD-10-CM

## 2020-10-15 DIAGNOSIS — I10 Essential (primary) hypertension: Secondary | ICD-10-CM

## 2020-10-15 MED ORDER — TORSEMIDE 20 MG PO TABS: ORAL_TABLET | ORAL | 3 refills | Status: DC

## 2020-10-15 MED ORDER — CLOPIDOGREL BISULFATE 75 MG PO TABS
75.0000 mg | ORAL_TABLET | Freq: Every day | ORAL | 3 refills | Status: DC
Start: 2020-10-15 — End: 2021-03-08

## 2020-10-15 MED ORDER — APIXABAN 5 MG PO TABS: ORAL_TABLET | ORAL | 3 refills | Status: DC

## 2020-10-15 MED ORDER — LOSARTAN POTASSIUM 100 MG PO TABS
100.0000 mg | ORAL_TABLET | Freq: Every day | ORAL | 3 refills | Status: DC
Start: 2020-10-15 — End: 2021-05-27

## 2020-10-15 MED ORDER — AMIODARONE HCL 200 MG PO TABS
100.0000 mg | ORAL_TABLET | Freq: Every day | ORAL | 3 refills | Status: DC
Start: 2020-10-15 — End: 2021-09-13

## 2020-10-15 MED ORDER — BISOPROLOL FUMARATE 5 MG PO TABS
5.0000 mg | ORAL_TABLET | Freq: Every day | ORAL | 3 refills | Status: DC
Start: 2020-10-15 — End: 2021-06-14

## 2020-10-15 MED ORDER — AMLODIPINE BESYLATE 10 MG PO TABS: 10.0000 mg | ORAL_TABLET | Freq: Every day | ORAL | 3 refills | Status: DC

## 2020-10-15 MED ORDER — SPIRONOLACTONE 25 MG PO TABS: 25.0000 mg | ORAL_TABLET | Freq: Every day | ORAL | 3 refills | Status: DC

## 2020-10-15 NOTE — Progress Notes (Signed)
Paramedicine Encounter    Patient ID: [name], female    DOB: 1961-01-02, 59 y.o.   MRN: [id number]   Patient Care Team: Loretha Brasil, FNP (Inactive) as PCP - General (Family Medicine) [name], MD as PCP - Cardiology (Cardiology) [name], MD as PCP - Electrophysiology (Cardiology) [name], MD as Consulting Physician (Cardiology) [name], MD as Consulting Physician (Pulmonary Disease)  Patient Active Problem List   Diagnosis Date Noted  . Hyperlipidemia 04/03/2020  . PAD (peripheral artery disease) ([place]) 12/26/2019  . Acute renal failure ([place])   . Acute respiratory failure with hypoxia ([place]) 12/02/2019  . Acute exacerbation of CHF (congestive heart failure) ([place]) 12/01/2019  . Cellulitis in diabetic foot ([place]) 07/08/2019  . Osteomyelitis ([place]) 06/21/2019  . NICM (nonischemic cardiomyopathy) ([place]) 06/20/2019  . Non-healing ulcer ([name]) 06/20/2019  . Acute CHF (congestive heart failure) ([place]) 11/26/2018  . CHF (congestive heart failure) ([place]) 11/26/2018  . Acute respiratory failure ([place]) 10/21/2018  . AKI (acute kidney injury) ([place]) 08/18/2018  . Coagulation disorder ([place]) 08/09/2017  . Depression 07/21/2017  . At risk for adverse drug reaction 06/20/2017  . Peripheral neuropathy 06/20/2017  . Acute osteomyelitis of right foot ([place]) 06/13/2017  . S/P transmetatarsal amputation of foot, right ([place]) 06/05/2017  . 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 SIRS (systemic inflammatory response syndrome) ([place]) 04/06/2017  . CKD (chronic kidney disease), stage III ([place]) 11/24/2016  . Suspected sleep apnea 11/24/2016  . Ulcer of toe of right foot, with necrosis of bone ([place]) 10/27/2016  . Ulcer of left lower leg ([place]) 05/19/2016  . Severe obesity (BMI >= 40) ([place]) 02/24/2016  . COPD GOLD 0  02/24/2016  . Encounter for therapeutic  drug monitoring 02/10/2016  . Symptomatic bradycardia 01/12/2016  . Essential hypertension 12/22/2015  . Chronic combined systolic and diastolic CHF (congestive heart failure) ([place])   . Wheeze   . 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 Tobacco abuse 10/23/2015  . Coronary artery disease   . DOE (dyspnea on exertion) 04/29/2015  . Diabetes mellitus type 2, uncontrolled ([place]) 02/08/2015  . Influenza A 02/07/2015  . Wrist fracture, left, with routine healing, subsequent encounter 02/05/2015  . Wrist fracture, left, closed, initial encounter 01/29/2015  . PAF (paroxysmal atrial fibrillation) ([place]) 01/16/2015  . Carotid arterial disease ([place]) 01/16/2015  . Claudication ([place]) 01/15/2015  . Demand ischemia ([place]) 10/29/2014  . Insomnia 02/03/2014  . COPD with acute exacerbation ([place]) 02/01/2014  . S/P peripheral artery angioplasty - [place]bo[place] atherectomy; R SFA 09/11/2013    Class: Acute  . Leg pain, bilateral 08/19/2013  . Hypothyroidism 07/31/2013  . Cellulitis 06/13/2013  . History of cocaine abuse ([place]) 06/13/2013  . Long term current use of anticoagulant therapy 05/20/2013  . Alcohol abuse   . Narcotic abuse ([place])   . Marijuana abuse   . Alcoholic cirrhosis ([place])   . DM (diabetes mellitus), type 2 with peripheral vascular complications ([name])     Current Outpatient Medications:  .  ACCU-CHEK GUIDE test strip, USE TO CHECK BLOOD SUGAR FOUR TIMES DAILY, Disp: , Rfl:  .  albuterol (VENTOLIN HFA) 108 (90 Base) MCG/ACT inhaler, Inhale 2 puffs into the lungs every 6 (six) hours as needed for wheezing or shortness of breath., Disp: 18 g, Rfl: 0 .  budesonide-formoterol (SYMBICORT) 160-4.5 MCG/ACT inhaler, Inhale 2 puffs into the lungs 2 (two)  times daily., Disp: 1 Inhaler, Rfl: 2 .  cholecalciferol (VITAMIN D) 1000 units tablet, Take 1 tablet (1,000 Units total) by mouth daily., Disp:  , Rfl:  .  D3-1000 25 MCG (1000 UT) capsule, Take 1,000 Units by mouth daily., Disp: , Rfl:  .   diclofenac Sodium (VOLTAREN) 1 % GEL, as needed., Disp: , Rfl:  .  folic acid (FOLVITE) 1 MG tablet, Take 1 tablet (1 mg total) by mouth daily., Disp: 30 tablet, Rfl: 0 .  Insulin Glargine (LANTUS SOLOSTAR) 100 UNIT/ML Solostar Pen, Inject 40 Units into the skin at bedtime. (Patient taking differently: Inject 30 Units into the skin at bedtime. ), Disp: , Rfl:  .  Insulin Lispro (HUMALOG KWIKPEN) 200 UNIT/ML SOPN, Inject 8 Units into the skin 3 (three) times daily with meals., Disp: , Rfl:  .  JARDIANCE 10 MG TABS tablet, TAKE 1 TABLET BY MOUTH DAILY BEFORE BREAKFAST, Disp: 30 tablet, Rfl: 6 .  levothyroxine (SYNTHROID, LEVOTHROID) 50 MCG tablet, Take 1 tablet (50 mcg total) by mouth daily., Disp: 30 tablet, Rfl: 0 .  nortriptyline (PAMELOR) 50 MG capsule, Take 50 mg by mouth at bedtime., Disp: , Rfl:  .  pantoprazole (PROTONIX) 40 MG tablet, Take 1 tablet (40 mg total) by mouth daily., Disp:  , Rfl:  .  tiotropium (SPIRIVA HANDIHALER) 18 MCG inhalation capsule, Place 1 capsule (18 mcg total) into inhaler and inhale every morning., Disp: 30 capsule, Rfl: 2 .  amiodarone (PACERONE) 200 MG tablet, Take 0.5 tablets (100 mg total) by mouth daily., Disp: 45 tablet, Rfl: 3 .  amLODipine (NORVASC) 10 MG tablet, Take 1 tablet (10 mg total) by mouth daily., Disp: 90 tablet, Rfl: 3 .  apixaban (ELIQUIS) 5 MG TABS tablet, TAKE 1 TABLET(5 MG) BY MOUTH TWICE DAILY, Disp: 180 tablet, Rfl: 3 .  bisoprolol (ZEBETA) 5 MG tablet, Take 1 tablet (5 mg total) by mouth daily., Disp: 90 tablet, Rfl: 3 .  celecoxib (CELEBREX) 200 MG capsule, Take 200 mg by mouth 2 (two) times daily., Disp: , Rfl:  .  clopidogrel (PLAVIX) 75 MG tablet, Take 1 tablet (75 mg total) by mouth daily., Disp: 90 tablet, Rfl: 3 .  losartan (COZAAR) 100 MG tablet, Take 1 tablet (100 mg total) by mouth daily., Disp: 90 tablet, Rfl: 3 .  spironolactone (ALDACTONE) 25 MG tablet, Take 1 tablet (25 mg total) by mouth daily., Disp: 90 tablet, Rfl: 3 .   torsemide (DEMADEX) 20 MG tablet, Take 3 tablets (60 mg total) by mouth every morning AND 2 tablets (40 mg total) every evening., Disp: 150 tablet, Rfl: 3 Allergies  Allergen Reactions  . Gabapentin Nausea And Vomiting and Other (See Comments)    POSSIBLE SHAKING? TAKING MED CURRENTLY  . Lyrica [Pregabalin] Other (See Comments)    Shaking Pt still taking lyrica--"probably will stop taking"       Social History   Socioeconomic History  . Marital status: Divorced    Spouse name: Not on file  . Number of children: 1  . Years of education: 72  . Highest education level: 12th grade  Occupational History  . Occupation: disabled  Tobacco Use  . Smoking status: Current Some Day Smoker    Packs/day: 1.00    Years: 44.00    Pack years: 44.00    Types: E-cigarettes, Cigarettes  . Smokeless tobacco: Former Systems developer    Types: Snuff  Vaping Use  . Vaping Use: Former  . Devices: 11/26/2018 "stopped months ago"  Substance  and Sexual Activity  . Alcohol use: Yes    Comment: occ  . Drug use: Not Currently    Types: "Crack" cocaine, Marijuana, Oxycodone  . Sexual activity: Not Currently  Other Topics Concern  . Not on file  Social History Narrative   Lives in [place], in [place] with sister.  They are looking to move but don't have a place to go yet.     Social Determinants of Health   Financial Resource Strain: Low Risk   . Difficulty of Paying Living Expenses: Not very hard  Food Insecurity: No Food Insecurity  . Worried About Charity fundraiser in the Last Year: Never true  . Ran Out of Food in the Last Year: Never true  Transportation Needs: No Transportation Needs  . Lack of Transportation (Medical): No  . Lack of Transportation (Non-Medical): No  Physical Activity: Insufficiently Active  . Days of Exercise per Week: 1 day  . Minutes of Exercise per Session: 10 min  Stress:   . Feeling of Stress : Not on file  Social Connections:   . Frequency of Communication with Friends and  Family: Not on file  . Frequency of Social Gatherings with Friends and Family: Not on file  . Attends Religious Services: Not on file  . Active Member of Clubs or Organizations: Not on file  . Attends Archivist Meetings: Not on file  . Marital Status: Not on file  Intimate Partner Violence:   . Fear of Current or Ex-Partner: Not on file  . Emotionally Abused: Not on file  . Physically Abused: Not on file  . Sexually Abused: Not on file    Physical Exam      Future Appointments  Date Time Provider [place]  11/27/2020  8:45 AM Gardiner Barefoot, DPM [facility]  01/05/2021 11:00 AM [facility] ECHO OP 1 [facility]  01/05/2021 12:00 PM Larey Dresser, MD [facility] None    BP (!) 150/70   Pulse 68   Resp 18   Wt 213 lb (96.6 kg)   LMP 11/27/2012   SpO2 95%   BMI 36.56 kg/m  CBG-271 Weight yesterday-? Last visit weight-208   Pt reports she is doing well. Her weight is up 5lbs from last week. Denies sob, no dizziness, no c/p.  She states over thanksgiving she over ate. Urinating good.  No bleeding issues.  She talked it over with the roommate about the bubble packs again and they have decided not to go back to that.  She did ask about using optumrx-will call insurance company. -she is eligible for home delivery. Created account for her on that.  Will need to get her doctor to send over her rx to fax  [phone].  [phone]  Account [id number]  Called her PCP to get those meds moved over and will get clinic to send over her   -needs rx for bisoprolol, eliquis, no bottle of losartan  Need to get samples of the eliquis for now.   meds checked. There were some errors in there-she had atorvastatin in AM and PM dose--I told her that error was in there. She said she got distracted when doing her pills.    Marylouise Stacks, [facility] [facility] Paramedic  10/15/20

## 2020-10-29 ENCOUNTER — Other Ambulatory Visit (HOSPITAL_COMMUNITY): Payer: Self-pay
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
## 2020-10-30 ENCOUNTER — Other Ambulatory Visit (HOSPITAL_COMMUNITY): Payer: Self-pay

## 2020-10-30 MED ORDER — EMPAGLIFLOZIN 10 MG PO TABS
ORAL_TABLET | ORAL | 6 refills | Status: DC
Start: 2020-10-30 — End: 2021-01-05

## 2020-11-11 ENCOUNTER — Other Ambulatory Visit (HOSPITAL_COMMUNITY): Payer: Self-pay
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
## 2020-11-16 IMAGING — DX DG FOREARM 2V*R*
2 series · 2 of 2 positions shown · non-contrast
Comparison: Right wrist radiograph dated 07/05/2019.

CLINICAL DATA: 58-year-old female with fall and trauma to the right
upper extremity.

EXAM:
RIGHT FOREARM - 2 VIEW

[forearm ap]
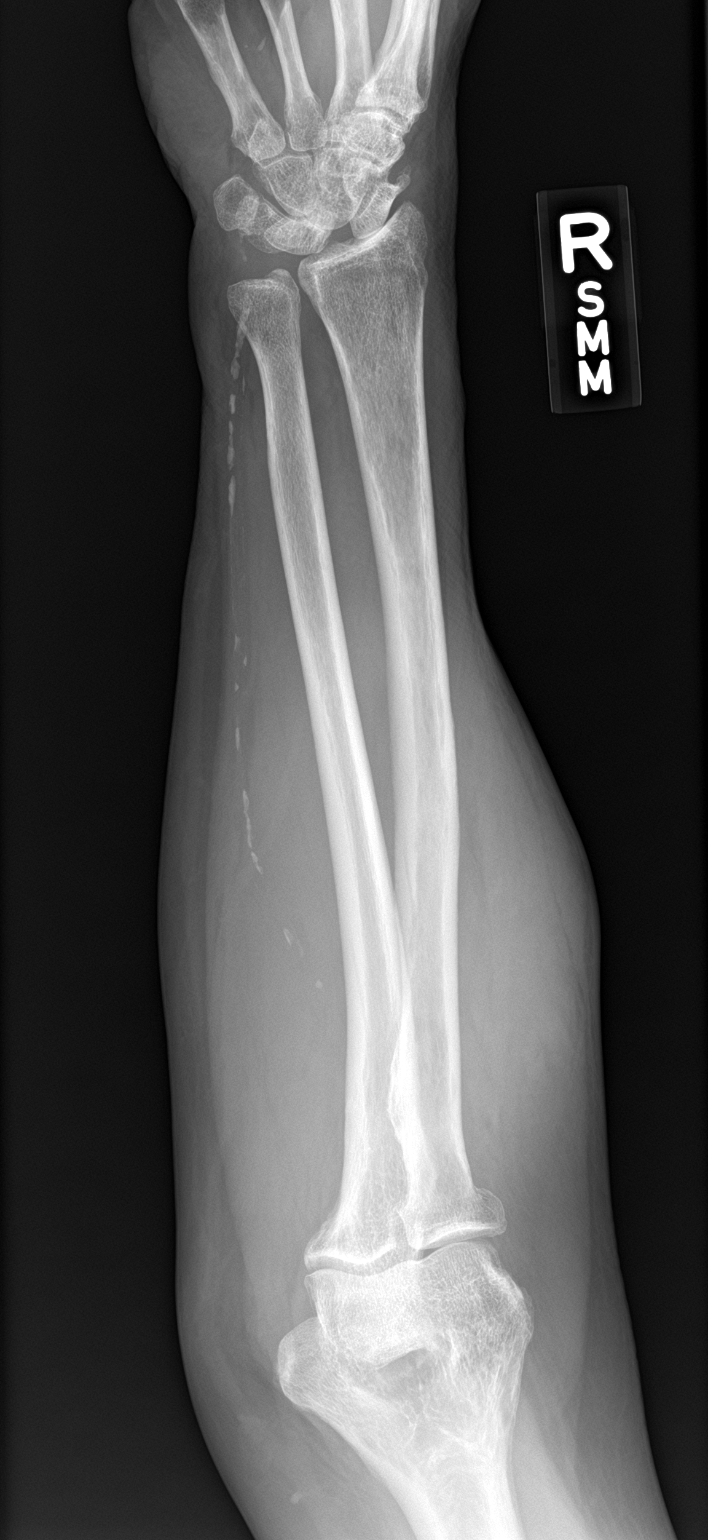

[forearm lat]
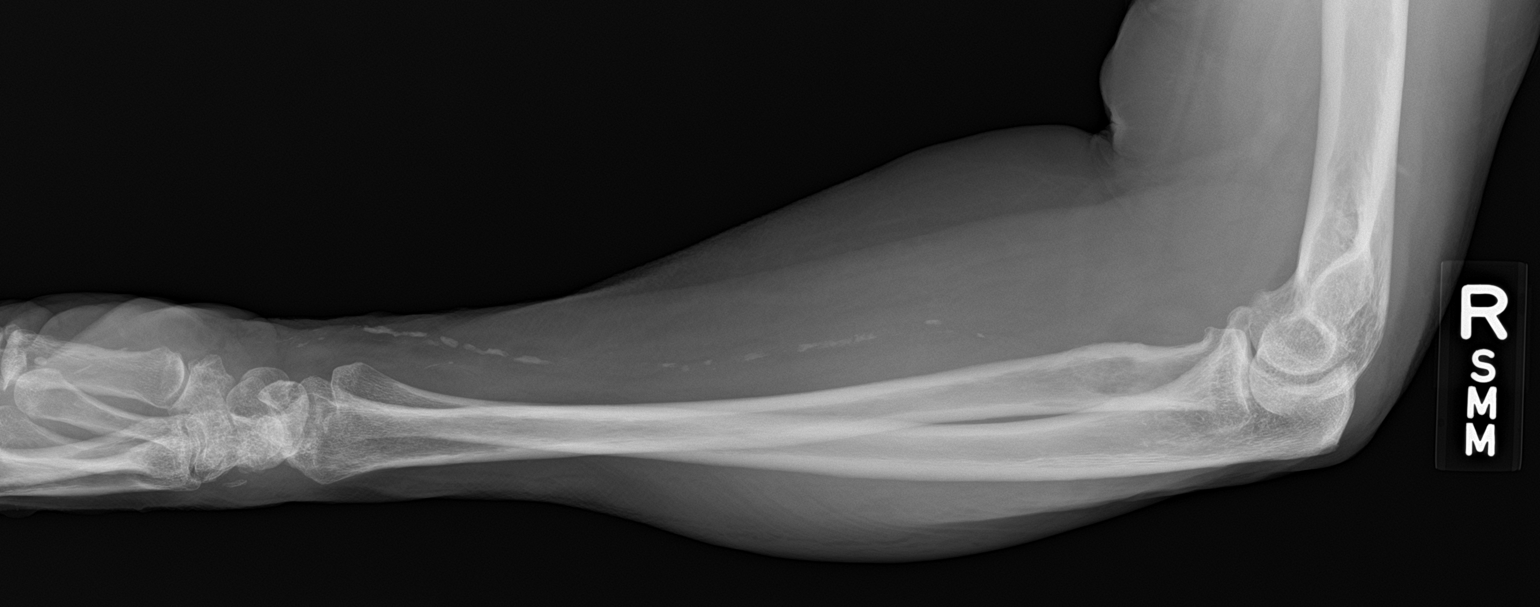

[2 of 2 positions shown; findings below may reference images not displayed]

FINDINGS: There is angulated appearance of the radial neck which although may
be chronic is concerning for a fracture. Clinical correlation is
recommended. CT may provide better evaluation if there is high
clinical concern for a radial neck fracture. There is no
dislocation. The bones are osteopenic. There is widening of the
scapholunate distance new since the prior radiograph. There is soft
tissue swelling of the dorsum of the forearm. Vascular
calcifications noted.
IMPRESSION: 1. Angulated appearance of the radial neck concerning for a
fracture. Clinical correlation is recommended.
2. Widening of the scapholunate distance.
3. Osteopenia.

## 2020-11-19 ENCOUNTER — Telehealth (HOSPITAL_COMMUNITY): Payer: Self-pay

## 2020-11-19 NOTE — Telephone Encounter (Signed)
pts roommate contacted me stating that [name] has been in bed for 2 days, her ankle is bruised and swollen and her breathing is loud and congested sounding.  I told her to either go to ER or urgent care. Roommate is going to try to get her to go there.  She has not been weighing daily. Roommate thinks she fell a few nights ago when "she was up all night smoking marijuana"   Marylouise Stacks, EMT-Paramedic  11/19/20

## 2020-11-19 NOTE — Telephone Encounter (Signed)
Pt called me back a little bit later and said she felt ok, just coughing up clear phlegm but that is not new for her. She smokes a lot of cigarettes and marijuana. She denies fever, c/p, dizziness. Her weight is the same. She denies any edema. She sounds fine over the phone.  Due to my very very busy schedule already today, I cannot make a home visit. I told her to go to urgent care or ER if she feels worse or decides to go.  Also gave them information over [place] testing.   Marylouise Stacks, EMT-Paramedic  11/19/20

## 2020-11-25 ENCOUNTER — Other Ambulatory Visit (HOSPITAL_COMMUNITY): Payer: Self-pay

## 2020-11-25 NOTE — Progress Notes (Signed)
Paramedicine Encounter    Patient ID: [name], female    DOB: 27-Jan-1961, 60 y.o.   MRN: [id number]   Patient Care Team: Loretha Brasil, FNP (Inactive) as PCP - General (Family Medicine) [name], MD as PCP - Cardiology (Cardiology) [name], MD as PCP - Electrophysiology (Cardiology) [name], MD as Consulting Physician (Cardiology) [name], MD as Consulting Physician (Pulmonary Disease)  Patient Active Problem List   Diagnosis Date Noted  . Hyperlipidemia 04/03/2020  . PAD (peripheral artery disease) ([place]) 12/26/2019  . Acute renal failure ([place])   . Acute respiratory failure with hypoxia ([place]) 12/02/2019  . Acute exacerbation of CHF (congestive heart failure) ([place]) 12/01/2019  . Cellulitis in diabetic foot ([place]) 07/08/2019  . Osteomyelitis ([place]) 06/21/2019  . NICM (nonischemic cardiomyopathy) ([place]) 06/20/2019  . Non-healing ulcer ([place]) 06/20/2019  . Acute CHF (congestive heart failure) ([place]) 11/26/2018  . CHF (congestive heart failure) ([place]) 11/26/2018  . Acute respiratory failure ([place]) 10/21/2018  . AKI (acute kidney injury) ([place]) 08/18/2018  . Coagulation disorder ([place]) 08/09/2017  . Depression 07/21/2017  . At risk for adverse drug reaction 06/20/2017  . Peripheral neuropathy 06/20/2017  . Acute osteomyelitis of right foot ([place]) 06/13/2017  . S/P transmetatarsal amputation of foot, right ([place]) 06/05/2017  . 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[place])   . SIRS (systemic inflammatory response syndrome) ([place]) 04/06/2017  . CKD (chronic kidney disease), stage III ([place]) 11/24/2016  . Suspected sleep apnea 11/24/2016  . Ulcer of toe of right foot, with necrosis of bone ([place]) 10/27/2016  . Ulcer of left lower leg ([place]) 05/19/2016  . Severe obesity (BMI >= 40) ([place]) 02/24/2016  . COPD GOLD 0  02/24/2016  . Encounter for therapeutic  drug monitoring 02/10/2016  . Symptomatic bradycardia 01/12/2016  . Essential hypertension 12/22/2015  . Chronic combined systolic and diastolic CHF (congestive heart failure) ([place])   . Wheeze   . 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 Tobacco abuse 10/23/2015  . Coronary artery disease   . DOE (dyspnea on exertion) 04/29/2015  . Diabetes mellitus type 2, uncontrolled ([place]) 02/08/2015  . Influenza A 02/07/2015  . Wrist fracture, left, with routine healing, subsequent encounter 02/05/2015  . Wrist fracture, left, closed, initial encounter 01/29/2015  . PAF (paroxysmal atrial fibrillation) ([place]) 01/16/2015  . Carotid arterial disease ([place]) 01/16/2015  . Claudication ([place]) 01/15/2015  . Demand ischemia ([place]) 10/29/2014  . Insomnia 02/03/2014  . COPD with acute exacerbation ([place]) 02/01/2014  . S/P peripheral artery angioplasty - TurboHawk atherectomy; R SFA 09/11/2013    Class: Acute  . Leg pain, bilateral 08/19/2013  . Hypothyroidism 07/31/2013  . Cellulitis 06/13/2013  . History of cocaine abuse ([place]) 06/13/2013  . Long term current use of anticoagulant therapy 05/20/2013  . Alcohol abuse   . Narcotic abuse ([place])   . Marijuana abuse   . Alcoholic cirrhosis ([place])   . DM (diabetes mellitus), type 2 with peripheral vascular complications ([name])     Current Outpatient Medications:  .  ACCU-CHEK GUIDE test strip, USE TO CHECK BLOOD SUGAR FOUR TIMES DAILY, Disp: , Rfl:  .  albuterol (VENTOLIN HFA) 108 (90 Base) MCG/ACT inhaler, Inhale 2 puffs into the lungs every 6 (six) hours as needed for wheezing or shortness of breath., Disp: 18 g, Rfl: 0 .  amiodarone (PACERONE) 200 MG tablet, Take 0.5 tablets (100 mg total) by mouth  daily., Disp: 45 tablet, Rfl: 3 .  amLODipine (NORVASC) 10 MG tablet, Take 1 tablet (10 mg total) by mouth daily., Disp: 90 tablet, Rfl: 3 .  apixaban (ELIQUIS) 5 MG TABS tablet, TAKE 1 TABLET(5 MG) BY MOUTH TWICE DAILY, Disp: 180 tablet, Rfl: 3 .  bisoprolol (ZEBETA) 5  MG tablet, Take 1 tablet (5 mg total) by mouth daily., Disp: 90 tablet, Rfl: 3 .  budesonide-formoterol (SYMBICORT) 160-4.5 MCG/ACT inhaler, Inhale 2 puffs into the lungs 2 (two) times daily., Disp: 1 Inhaler, Rfl: 2 .  cholecalciferol (VITAMIN D) 1000 units tablet, Take 1 tablet (1,000 Units total) by mouth daily., Disp:  , Rfl:  .  clopidogrel (PLAVIX) 75 MG tablet, Take 1 tablet (75 mg total) by mouth daily., Disp: 90 tablet, Rfl: 3 .  D3-1000 25 MCG (1000 UT) capsule, Take 1,000 Units by mouth daily., Disp: , Rfl:  .  diclofenac Sodium (VOLTAREN) 1 % GEL, as needed., Disp: , Rfl:  .  empagliflozin (JARDIANCE) 10 MG TABS tablet, TAKE 1 TABLET BY MOUTH DAILY BEFORE BREAKFAST, Disp: 30 tablet, Rfl: 6 .  folic acid (FOLVITE) 1 MG tablet, Take 1 tablet (1 mg total) by mouth daily., Disp: 30 tablet, Rfl: 0 .  Insulin Glargine (LANTUS SOLOSTAR) 100 UNIT/ML Solostar Pen, Inject 40 Units into the skin at bedtime. (Patient taking differently: Inject 30 Units into the skin at bedtime.), Disp: , Rfl:  .  Insulin Lispro (HUMALOG KWIKPEN) 200 UNIT/ML SOPN, Inject 8 Units into the skin 3 (three) times daily with meals., Disp: , Rfl:  .  levothyroxine (SYNTHROID, LEVOTHROID) 50 MCG tablet, Take 1 tablet (50 mcg total) by mouth daily., Disp: 30 tablet, Rfl: 0 .  losartan (COZAAR) 100 MG tablet, Take 1 tablet (100 mg total) by mouth daily., Disp: 90 tablet, Rfl: 3 .  nortriptyline (PAMELOR) 50 MG capsule, Take 50 mg by mouth at bedtime., Disp: , Rfl:  .  pantoprazole (PROTONIX) 40 MG tablet, Take 1 tablet (40 mg total) by mouth daily., Disp:  , Rfl:  .  spironolactone (ALDACTONE) 25 MG tablet, Take 1 tablet (25 mg total) by mouth daily., Disp: 90 tablet, Rfl: 3 .  tiotropium (SPIRIVA HANDIHALER) 18 MCG inhalation capsule, Place 1 capsule (18 mcg total) into inhaler and inhale every morning., Disp: 30 capsule, Rfl: 2 .  torsemide (DEMADEX) 20 MG tablet, Take 3 tablets (60 mg total) by mouth every morning AND 2  tablets (40 mg total) every evening., Disp: 150 tablet, Rfl: 3 .  celecoxib (CELEBREX) 200 MG capsule, Take 200 mg by mouth 2 (two) times daily. (Patient not taking: No sig reported), Disp: , Rfl:  Allergies  Allergen Reactions  . Gabapentin Nausea And Vomiting and Other (See Comments)    POSSIBLE SHAKING? TAKING MED CURRENTLY  . Lyrica [Pregabalin] Other (See Comments)    Shaking Pt still taking lyrica--"probably will stop taking"       Social History   Socioeconomic History  . Marital status: Divorced    Spouse name: Not on file  . Number of children: 1  . Years of education: 51  . Highest education level: 12th grade  Occupational History  . Occupation: disabled  Tobacco Use  . Smoking status: Current Some Day Smoker    Packs/day: 1.00    Years: 44.00    Pack years: 44.00    Types: E-cigarettes, Cigarettes  . Smokeless tobacco: Former Systems developer    Types: Snuff  Vaping Use  . Vaping Use: Former  . Devices:  11/26/2018 "stopped months ago"  Substance and Sexual Activity  . Alcohol use: Yes    Comment: occ  . Drug use: Not Currently    Types: "Crack" cocaine, Marijuana, Oxycodone  . Sexual activity: Not Currently  Other Topics Concern  . Not on file  Social History Narrative   Lives in [place], in [place] with sister.  They are looking to move but don't have a place to go yet.     Social Determinants of Health   Financial Resource Strain: Low Risk   . Difficulty of Paying Living Expenses: Not very hard  Food Insecurity: No Food Insecurity  . Worried About Charity fundraiser in the Last Year: Never true  . Ran Out of Food in the Last Year: Never true  Transportation Needs: No Transportation Needs  . Lack of Transportation (Medical): No  . Lack of Transportation (Non-Medical): No  Physical Activity: Insufficiently Active  . Days of Exercise per Week: 1 day  . Minutes of Exercise per Session: 10 min  Stress: Not on file  Social Connections: Not on file  Intimate Partner  Violence: Not on file    Physical Exam      Future Appointments  Date Time Provider [place]  12/04/2020  8:15 AM Gardiner Barefoot, DPM [facility]  01/05/2021 11:00 AM [facility] OP 1 [facility]  01/05/2021 12:00 PM Larey Dresser, MD [facility] None    BP 140/68   Pulse 61   Temp 97.7 F (36.5 C)   Resp 20   Wt 220 lb (99.8 kg)   LMP 11/27/2012   SpO2 98%   BMI 37.76 kg/m  CBG PTA-201 Weight yesterday-219 Last visit weight-214  Pt reports she is not doing ok-she had several falls last year and this is a chronic issue with her knee pain.  Her weight is up 5lbs since 12/29--about 2 wks.  She was seen at PCP today for f/u.  There was a lot of yelling and altercation between her and the roommate about the care the pt does for herself, I guess the lack of it. And how the roommate has helped her so much with her meds but the pt just wants to go back to taking her meds from the bottle. Roommate gets very upset with her when she takes the wrong dose-she has taken the roommates meds on accident before too-and gets upset when pt misses doses.  Reviewed meds with her-she just read the labels.  She states she will put the pills in the pill box herself.  She sat here with me and placed them in the pill box. She did make a few errors and those were corrected. Her roommate was there as well supervising.  She has not been as mobile with her foot/ankle pain.  At this point I have done all I can with her, she is not open to any other options. I had set up bubble packs for her when she was referred last time but she stopped that b/c she didn't like it.  So she doesn't want that again. She is agreeable to d/c. At this point there is no need for me to come out anymore.     Marylouise Stacks, [place] [facility] Paramedic  11/25/20

## 2020-11-26 ENCOUNTER — Other Ambulatory Visit (HOSPITAL_COMMUNITY): Payer: Self-pay

## 2020-11-26 NOTE — Progress Notes (Signed)
Patient is now discharged from [name].  Patient has/has not met the following goals:  Yes :Patient expresses basic understanding of medications and what they are for Yes :Patient able to verbalize heart failure specific dietary/fluid restrictions Yes :Patient is aware of who to call if they have medical concerns or if they need to schedule or change appts No :Patient has a scale for daily weights and weighs regularly--has a scale but does not use it daily Yes :Patient able to verbalize concerning symptoms when they should call the HF clinic (weight gain ranges, etc) Yes :Patient has a PCP and has seen within the past year or has upcoming appt Yes :Patient has reliable access to getting their medications Yes :Patient has shown they are able to reorder medications reliably No :Patient has had admission in past 30 days- if yes how many? No :Patient has had admission in past 90 days- if yes how many?  Discharge Comments:   Pt has the know how but just not the want to.  All services and help has been offered but not open to anything else.    Marylouise Stacks, EMT-Paramedic  11/26/20

## 2020-11-27 ENCOUNTER — Ambulatory Visit: Payer: Medicare Other | Admitting: Podiatry

## 2020-12-02 ENCOUNTER — Telehealth: Payer: Self-pay | Admitting: *Deleted

## 2020-12-02 NOTE — Telephone Encounter (Signed)
Patient is calling to cancel appointment for 12/03/20 due to weather, will call back to reschedule at a later date.

## 2020-12-04 ENCOUNTER — Ambulatory Visit: Payer: Medicare Other | Admitting: Podiatry

## 2020-12-24 ENCOUNTER — Emergency Department (HOSPITAL_COMMUNITY)
Admission: EM | Admit: 2020-12-24 | Discharge: 2020-12-24 | Disposition: A | Discharge status: Home / Self Care | Payer: Medicare Other | Attending: Emergency Medicine | Admitting: Emergency Medicine

## 2020-12-24 ENCOUNTER — Encounter (HOSPITAL_COMMUNITY): Payer: Self-pay

## 2020-12-24 ENCOUNTER — Other Ambulatory Visit: Payer: Self-pay

## 2020-12-24 DIAGNOSIS — J449 Chronic obstructive pulmonary disease, unspecified: Secondary | ICD-10-CM | POA: Insufficient documentation

## 2020-12-24 DIAGNOSIS — F331 Major depressive disorder, recurrent, moderate: Secondary | ICD-10-CM | POA: Diagnosis not present

## 2020-12-24 DIAGNOSIS — I5042 Chronic combined systolic (congestive) and diastolic (congestive) heart failure: Secondary | ICD-10-CM | POA: Diagnosis not present

## 2020-12-24 DIAGNOSIS — E039 Hypothyroidism, unspecified: Secondary | ICD-10-CM | POA: Insufficient documentation

## 2020-12-24 DIAGNOSIS — E1122 Type 2 diabetes mellitus with diabetic chronic kidney disease: Secondary | ICD-10-CM | POA: Diagnosis not present

## 2020-12-24 DIAGNOSIS — F515 Nightmare disorder: Secondary | ICD-10-CM | POA: Diagnosis not present

## 2020-12-24 DIAGNOSIS — N183 Chronic kidney disease, stage 3 unspecified: Secondary | ICD-10-CM | POA: Insufficient documentation

## 2020-12-24 DIAGNOSIS — F1290 Cannabis use, unspecified, uncomplicated: Secondary | ICD-10-CM | POA: Diagnosis not present

## 2020-12-24 DIAGNOSIS — Z79899 Other long term (current) drug therapy: Secondary | ICD-10-CM | POA: Diagnosis not present

## 2020-12-24 DIAGNOSIS — I2510 Atherosclerotic heart disease of native coronary artery without angina pectoris: Secondary | ICD-10-CM | POA: Diagnosis not present

## 2020-12-24 DIAGNOSIS — I130 Hypertensive heart and chronic kidney disease with heart failure and stage 1 through stage 4 chronic kidney disease, or unspecified chronic kidney disease: Secondary | ICD-10-CM | POA: Diagnosis not present

## 2020-12-24 DIAGNOSIS — Z7901 Long term (current) use of anticoagulants: Secondary | ICD-10-CM | POA: Diagnosis not present

## 2020-12-24 DIAGNOSIS — Z794 Long term (current) use of insulin: Secondary | ICD-10-CM | POA: Diagnosis not present

## 2020-12-24 DIAGNOSIS — F17210 Nicotine dependence, cigarettes, uncomplicated: Secondary | ICD-10-CM | POA: Insufficient documentation

## 2020-12-24 NOTE — ED Triage Notes (Addendum)
Per EMS-states patient has smokes so much THC that she cant sleep and is having night terrors-patient called PCP and they told her to come to ED for a "full work up"

## 2020-12-24 NOTE — ED Provider Notes (Signed)
[facility] DEPT Provider Note   CSN: [id number] Arrival date & time: 12/24/20  [id number]     History No chief complaint on file.   [name] is a 60 y.o. female with complex past medical history including previous alcohol abuse and polysubstance abuse including cocaine, depression presents to the ED for nightmares.  This has been going on for 3 months.  Reports scary and very real dreams.  She is waking up screaming "help me help me".  When she wakes up the dreams do stop.  Denies visual hallucinations however states sometimes when she wakes up when she looks up at her ceiling fan it looks like a bird which also is scary to her.  This does not happen daily or frequently but only sometimes.  Reports history of depression but does not know if she is on medicines for it.  Feels like her depression is at her baseline.  No suicidal thoughts, homicidal thoughts, auditory or tactile hallucinations.  Reports previous alcohol abuse and cocaine use but she has since quit.  She smokes marijuana 3 times a day.  She has a pipe that she feels with marijuana and smokes it morning, afternoon and night.  She buys 7 g of marijuana a month.  Reports using this for her chronic pain and depression.  Triage note states patient called her PCP who told her to come to the ED for "full work-up" however patient states she never called her primary care doctor.  She woke up screaming "help me" this morning and the person she lives with told her she needed to be checked out.  Denies any other physical complaints.  Does not have a therapist or psychologist.  Reports significant amount of stress at home, becomes teary-eyed with discussions of her medical problems and her living situation.  HPI     Past Medical History:  Diagnosis Date  . Alcohol abuse   . Alcoholic cirrhosis ([place])   . Anemia   . Anxiety   . Arthritis    "knees" (11/26/2018)  . B12 deficiency   . CAD (coronary artery disease)     a. 11/10/2014 Cath: LM nl, LAD min irregs, D1 30 ost, D2 50d, LCX 35m OM1 80 p/m (1.5 mm vessel), OM2 419mRCA nondom 9041mmed rx.. Demand ischemia in the setting of rapid a-fib.  . Cardiomyopathy (HCC[place] . Carotid artery disease (HCC[place] City  a. 01/2015 Carotid Angio: RICA [id number]. [id number] L CEA; c. 05/2019 Carotid U/S: RICA 100. RECA >50. LICA [id number]. Cellulitis    lower extremities  . Chronic combined systolic and diastolic CHF (congestive heart failure) (HCCWest Okobo[place]  a. 10/2014 Echo: EF 40-45%; b. 10/2018 Echo: EF 45-50%, gr2 DD; c. 11/2019 Echo: EF 50%, mild LVH, gr2 DD (restrictive), antlat HK, Nl RV fxn. Mild BAE. RVSP 59.9mm25m  . CKD (chronic kidney disease), stage III ([name])[place]. Cocaine abuse ([name])[place]. COPD (chronic obstructive pulmonary disease) ([name])[place]. Critical lower limb ischemia ([name])[place]. Depression   . Diabetic peripheral neuropathy ([name])[place]. Dyspnea   . Elevated troponin    a. Chronic elevation.  . GEMarland KitchenD (gastroesophageal reflux disease)   . Hyperlipemia   . Hypertension   . Hypokalemia   . Hypomagnesemia   . Hypothyroidism   . Marijuana abuse   . Narcotic abuse ([name])[place]. Noncompliance   . NSVT (nonsustained ventricular tachycardia) ([name])[place].  Obesity   . PAF (paroxysmal atrial fibrillation) ([place])   . Paroxysmal atrial tachycardia ([place])   . Peripheral arterial disease ([place])    a. 01/2015 Angio/PTA: RSFA 99 (atherectomy/pta) - 1 vessel runoff via diff dzs peroneal; b. 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  . Pneumonia    "once or twice" (11/26/2018)  . Poorly controlled type 2 diabetes mellitus ([place])   . Renal insufficiency    a. 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  [name] Sleep apnea    "couldn't handle wearing the mask" (11/26/2018)  . Symptomatic bradycardia    a. Avoid AV blocking agent per EP. Prev req temp wire in 2017.  . Tobacco abuse     Patient Active Problem List   Diagnosis Date Noted  . Hyperlipidemia 04/03/2020  . PAD (peripheral  artery disease) ([place]) 12/26/2019  . Acute renal failure ([place])   . Acute respiratory failure with hypoxia ([place]) 12/02/2019  . Acute exacerbation of CHF (congestive heart failure) ([place]) 12/01/2019  . Cellulitis in diabetic foot ([place]) 07/08/2019  . Osteomyelitis ([place]) 06/21/2019  . NICM (nonischemic cardiomyopathy) ([place]) 06/20/2019  . Non-healing ulcer ([place]) 06/20/2019  . Acute CHF (congestive heart failure) ([place]) 11/26/2018  . CHF (congestive heart failure) ([place]) 11/26/2018  . Acute respiratory failure ([place]) 10/21/2018  . AKI (acute kidney injury) ([place]) 08/18/2018  . Coagulation disorder ([place]) 08/09/2017  . Depression 07/21/2017  . At risk for adverse drug reaction 06/20/2017  . Peripheral neuropathy 06/20/2017  . Acute osteomyelitis of right foot ([place]) 06/13/2017  . S/P transmetatarsal amputation of foot, right ([place]) 06/05/2017  . 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 SIRS (systemic inflammatory response syndrome) ([place]) 04/06/2017  . CKD (chronic kidney disease), stage III ([place]) 11/24/2016  . Suspected sleep apnea 11/24/2016  . Ulcer of toe of right foot, with necrosis of bone ([place]) 10/27/2016  . Ulcer of left lower leg ([place]) 05/19/2016  . Severe obesity (BMI >= 40) ([place]) 02/24/2016  . COPD GOLD 0  02/24/2016  . Encounter for therapeutic drug monitoring 02/10/2016  . Symptomatic bradycardia 01/12/2016  . Essential hypertension 12/22/2015  . Chronic combined systolic and diastolic CHF (congestive heart failure) ([place])   . Wheeze   . 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 Tobacco abuse 10/23/2015  . Coronary artery disease   . DOE (dyspnea on exertion) 04/29/2015  . Diabetes mellitus type 2, uncontrolled ([place]) 02/08/2015  . Influenza A 02/07/2015  . Wrist fracture, left, with routine healing, subsequent encounter 02/05/2015  . Wrist fracture, left, closed, initial  encounter 01/29/2015  . PAF (paroxysmal atrial fibrillation) ([place]) 01/16/2015  . Carotid arterial disease ([place]) 01/16/2015  . Claudication ([place]) 01/15/2015  . Demand ischemia ([place]) 10/29/2014  . Insomnia 02/03/2014  . COPD with acute exacerbation ([place]) 02/01/2014  . S/P peripheral artery angioplasty - [place]bo[place] atherectomy; R SFA 09/11/2013    Class: Acute  . Leg pain, bilateral 08/19/2013  . Hypothyroidism 07/31/2013  . Cellulitis 06/13/2013  . History of cocaine abuse ([place]) 06/13/2013  . Long term current use of anticoagulant therapy 05/20/2013  . Alcohol abuse   . Narcotic abuse ([place])   . Marijuana abuse   . Alcoholic cirrhosis ([place])   . DM (diabetes mellitus), type 2 with peripheral vascular complications [facility])     Past Surgical History:  Procedure Laterality Date  . ABDOMINAL AORTOGRAM N/A 06/26/2019   Procedure:  ABDOMINAL AORTOGRAM;  Surgeon: Wellington Hampshire, MD;  Location: [place] CV LAB;  Service: Cardiovascular;  Laterality: N/A;  . AMPUTATION Right 06/14/2017   Procedure: Right foot transmetatarsal amputation;  Surgeon: Newt Minion, MD;  Location: [facility];  Service: Orthopedics;  Laterality: Right;  . AMPUTATION Left 06/28/2019   Procedure: AMPUTATION LEFT FIFTH TOE;  Surgeon: Rosetta Posner, MD;  Location: [facility];  Service: Vascular;  Laterality: Left;  . AMPUTATION TOE Right 04/28/2017   Procedure: AMPUTATION OF RIGHT SECOND RAY;  Surgeon: Newt Minion, MD;  Location: [facility];  Service: Orthopedics;  Laterality: Right;  . CARDIAC CATHETERIZATION    . CARDIAC CATHETERIZATION N/A 01/12/2016   Procedure: Temporary Wire;  Surgeon: Minus Breeding, MD;  Location: [place] CV LAB;  Service: Cardiovascular;  Laterality: N/A;  . CARDIOVERSION  ~ 02/2013   "twice"   . CAROTID ANGIOGRAM N/A 01/15/2015   Procedure: CAROTID ANGIOGRAM;  Surgeon: [name], MD;  Location: [facility] CATH LAB;  Service: Cardiovascular;  Laterality: N/A;  . DILATION AND CURETTAGE OF UTERUS  1988  .  ENDARTERECTOMY Left 02/19/2015   Procedure: LEFT CAROTID ENDARTERECTOMY ;  Surgeon: Serafina Mitchell, MD;  Location: [place];  Service: Vascular;  Laterality: Left;  . FEMORAL-TIBIAL BYPASS GRAFT Left 06/28/2019   Procedure: BYPASS GRAFT LEFT LEG FEMORAL TO ANTERIOR TIBIAL ARTERY using LEFT GREATER SAPHENOUS VEIN;  Surgeon: Rosetta Posner, MD;  Location: [place];  Service: Vascular;  Laterality: Left;  . LEFT HEART CATHETERIZATION WITH CORONARY ANGIOGRAM N/A 10/31/2014   Procedure: LEFT HEART CATHETERIZATION WITH CORONARY ANGIOGRAM;  Surgeon: Burnell Blanks, MD;  Location: [facility] CATH LAB;  Service: Cardiovascular;  Laterality: N/A;  . LOWER EXTREMITY ANGIOGRAM N/A 09/10/2013   Procedure: LOWER EXTREMITY ANGIOGRAM;  Surgeon: [name], MD;  Location: [facility] CATH LAB;  Service: Cardiovascular;  Laterality: N/A;  . LOWER EXTREMITY ANGIOGRAM N/A 01/15/2015   Procedure: LOWER EXTREMITY ANGIOGRAM;  Surgeon: [name], MD;  Location: [facility] CATH LAB;  Service: Cardiovascular;  Laterality: N/A;  . LOWER EXTREMITY ANGIOGRAPHY N/A 04/13/2017   Procedure: Lower Extremity Angiography;  Surgeon: [name], MD;  Location: [place] CV LAB;  Service: Cardiovascular;  Laterality: N/A;  . LOWER EXTREMITY ANGIOGRAPHY Left 06/26/2019   Procedure: LOWER EXTREMITY ANGIOGRAPHY;  Surgeon: Wellington Hampshire, MD;  Location: [place] CV LAB;  Service: Cardiovascular;  Laterality: Left;  . PERIPHERAL VASCULAR BALLOON ANGIOPLASTY Left 06/26/2019   Procedure: PERIPHERAL VASCULAR BALLOON ANGIOPLASTY;  Surgeon: Wellington Hampshire, MD;  Location: [place] CV LAB;  Service: Cardiovascular;  Laterality: Left;  unable to cross lt sfa occlusion  . PERIPHERAL VASCULAR INTERVENTION  04/13/2017   Procedure: Peripheral Vascular Intervention;  Surgeon: [name], MD;  Location: [place] CV LAB;  Service: Cardiovascular;;  . PRESSURE SENSOR/CARDIOMEMS N/A 02/05/2020   Procedure: PRESSURE SENSOR/CARDIOMEMS;  Surgeon:  Larey Dresser, MD;  Location: [place] CV LAB;  Service: Cardiovascular;  Laterality: N/A;  . VEIN HARVEST Left 06/28/2019   Procedure: LEFT LEG GREATER SAPHENOUS VEIN HARVEST;  Surgeon: Rosetta Posner, MD;  Location[facility] OR;  Service: Vascular;  Laterality: Left;     OB History    Gravida  1   Para  1   Term  1   Preterm      AB      Living  1     SAB      IAB      Ectopic  Multiple      Live Births              Family History  Problem Relation Age of Onset  . Hypertension Mother   . Diabetes Mother   . Cancer Mother        breast, ovarian, colon  . Clotting disorder Mother   . Heart disease Mother   . Heart attack Mother   . Breast cancer Mother        in 63's  . Hypertension Father   . Heart disease Father   . Emphysema Sister        smoked    Social History   Tobacco Use  . Smoking status: Current Some Day Smoker    Packs/day: 1.00    Years: 44.00    Pack years: 44.00    Types: E-cigarettes, Cigarettes  . Smokeless tobacco: Former Systems developer    Types: Snuff  Vaping Use  . Vaping Use: Former  . Devices: 11/26/2018 "stopped months ago"  Substance Use Topics  . Alcohol use: Yes    Comment: occ  . Drug use: Yes    Types: "Crack" cocaine, Marijuana, Oxycodone    Home Medications Prior to Admission medications   Medication Sig Start Date End Date Taking? Authorizing Provider  ACCU-CHEK GUIDE test strip USE TO CHECK BLOOD SUGAR FOUR TIMES DAILY 02/25/20   [provider]  albuterol (VENTOLIN HFA) 108 (90 Base) MCG/ACT inhaler Inhale 2 puffs into the lungs every 6 (six) hours as needed for wheezing or shortness of breath. 07/08/19   Oretha Milch D, MD  amiodarone (PACERONE) 200 MG tablet Take 0.5 tablets (100 mg total) by mouth daily. 10/15/20   Larey Dresser, MD  amLODipine (NORVASC) 10 MG tablet Take 1 tablet (10 mg total) by mouth daily. 10/15/20   Larey Dresser, MD  apixaban (ELIQUIS) 5 MG TABS tablet TAKE 1 TABLET(5 MG) BY  MOUTH TWICE DAILY 10/15/20   Larey Dresser, MD  bisoprolol (ZEBETA) 5 MG tablet Take 1 tablet (5 mg total) by mouth daily. 10/15/20   Larey Dresser, MD  budesonide-formoterol [facility]) 160-4.5 MCG/ACT inhaler Inhale 2 puffs into the lungs 2 (two) times daily. 12/09/19   Eugenie Filler, MD  celecoxib (CELEBREX) 200 MG capsule Take 200 mg by mouth 2 (two) times daily. Patient not taking: No sig reported 09/03/20   [provider]  cholecalciferol (VITAMIN D) 1000 units tablet Take 1 tablet (1,000 Units total) by mouth daily. 07/08/19   Oretha Milch D, MD  clopidogrel (PLAVIX) 75 MG tablet Take 1 tablet (75 mg total) by mouth daily. 10/15/20   Larey Dresser, MD  D3-1000 25 MCG (1000 UT) capsule Take 1,000 Units by mouth daily. 09/03/20   [provider]  diclofenac Sodium (VOLTAREN) 1 % GEL as needed.    [provider]  empagliflozin (JARDIANCE) 10 MG TABS tablet TAKE 1 TABLET BY MOUTH DAILY BEFORE BREAKFAST 10/30/20   Larey Dresser, MD  folic acid (FOLVITE) 1 MG tablet Take 1 tablet (1 mg total) by mouth daily. 07/08/19   Desiree Hane, MD  Insulin Glargine (LANTUS SOLOSTAR) 100 UNIT/ML Solostar Pen Inject 40 Units into the skin at bedtime. Patient taking differently: Inject 30 Units into the skin at bedtime. 12/09/19   Eugenie Filler, MD  Insulin Lispro (HUMALOG KWIKPEN) 200 UNIT/ML SOPN Inject 8 Units into the skin 3 (three) times daily with meals. 07/08/19   Desiree Hane, MD  levothyroxine (  SYNTHROID, LEVOTHROID) 50 MCG tablet Take 1 tablet (50 mcg total) by mouth daily. 08/09/17   Medina-Vargas, Monina C, NP  losartan (COZAAR) 100 MG tablet Take 1 tablet (100 mg total) by mouth daily. 10/15/20   Larey Dresser, MD  nortriptyline (PAMELOR) 50 MG capsule Take 50 mg by mouth at bedtime. 11/28/19   [provider]  pantoprazole (PROTONIX) 40 MG tablet Take 1 tablet (40 mg total) by mouth daily. 02/21/20   Larey Dresser, MD  spironolactone  (ALDACTONE) 25 MG tablet Take 1 tablet (25 mg total) by mouth daily. 10/15/20   Larey Dresser, MD  tiotropium (SPIRIVA HANDIHALER) 18 MCG inhalation capsule Place 1 capsule (18 mcg total) into inhaler and inhale every morning. 12/09/19   Eugenie Filler, MD  torsemide (DEMADEX) 20 MG tablet Take 3 tablets (60 mg total) by mouth every morning AND 2 tablets (40 mg total) every evening. 10/15/20   Larey Dresser, MD  atorvastatin (LIPITOR) 40 MG tablet TAKE 1 TABLET BY MOUTH EVERY EVENING 06/15/20 10/15/20  Clegg, [name] D, NP  colchicine 0.6 MG tablet TAKE 2 TABLETS BY MOUTH AT ONSET OF GOUT FLARE, THEN TAKE 1 TABLET IN 1 HOUR, THEN STOP 07/21/20 10/07/20  Larey Dresser, MD    Allergies    Gabapentin and Lyrica [pregabalin]  Review of Systems   Review of Systems  Psychiatric/Behavioral: Positive for dysphoric mood and sleep disturbance.  All other systems reviewed and are negative.   Physical Exam Updated Vital Signs BP 133/79 (BP Location: Right Arm)   Pulse (!) 54   Temp 98.1 F (36.7 C) (Oral)   Resp 16   Ht '5\' 4"'$  (1.626 m)   Wt 103.4 kg   LMP 11/27/2012   SpO2 93%   BMI 39.14 kg/m   Physical Exam Constitutional:      Appearance: She is well-developed.  HENT:     Head: Normocephalic.     Nose: Nose normal.  Eyes:     General: Lids are normal.  Cardiovascular:     Rate and Rhythm: Normal rate.  Pulmonary:     Effort: Pulmonary effort is normal. No respiratory distress.  Musculoskeletal:        General: Normal range of motion.     Cervical back: Normal range of motion.  Neurological:     Mental Status: She is alert.  Psychiatric:        Mood and Affect: Affect is tearful.        Behavior: Behavior normal.     Comments: Pleasant. Good eye contact. Provides adequate history. Teary eyed. Denies SI, HI.  Intermittent visual hallucinations (ceiling fan looks like a bird). No auditory or tactile hallucinations.      ED Results / Procedures / Treatments   Labs (all  labs ordered are listed, but only abnormal results are displayed) Labs Reviewed - No data to display  EKG None  Radiology No results found.  Procedures Procedures   Medications Ordered in ED Medications - No data to display  ED Course  I have reviewed the triage vital signs and the nursing notes.  Pertinent labs & imaging results that were available during my care of the patient were reviewed by me and considered in my medical decision making (see chart for details).    MDM Rules/Calculators/A&P                           60 y.o. year old  female presents with distressing nightmares for the last 3 months.  This is in setting of chronic, daily marijuana use, depression and social stressors regarding her chronic medical problems, living situation.  Previous history of alcohol and cocaine use but has since quit.  Denies acute psychiatric complaints that would warrant emergent evaluation or psychiatric evaluation.  No suicidal thoughts or attempts, homicidal ideations, hallucinations.  I do not think emergent lab work or imaging in the ED is indicated as it will be low yield did not provide relevant information at this time.  I don't think there is underlying organic/medical etiology. Patient deemed appropriate for discharge.  I discussed with her importance of good sleep hygiene, marijuana cessation and follow-up with PCP.  She was given behavioral health urgent care and recommended follow-up with them to help find outpatient community therapist and substance use disorder services.  Return precautions discussed.  She understands symptoms that would require emergent psychiatric evaluation.  Patient in agreement with plan of care.  Final Clinical Impression(s) / ED Diagnoses Final diagnoses:  Nightmares  Moderate episode of recurrent major depressive disorder ([place])  Marijuana use, continuous    Rx / DC Orders ED Discharge Orders    None       Kinnie Feil, PA-C 12/24/20  [id number]    Blanchie Dessert, MD 12/28/20 1727

## 2020-12-24 NOTE — Discharge Instructions (Signed)
You were seen in the emergency department for abnormal dreams, nightmares  There is no specific lab work or imaging in the ED that can provide information to determine the cause of your symptoms.  Your symptoms may be from recent psychological stress, high doses of marijuana use or underlying depression  Continue all your medicines.  Consider cutting back on marijuana use.  You may need help with marijuana cessation.  Please call your primary care doctor and schedule an appointment in the next week to discuss referral to psychology or outpatient substance abuse clinics  Please see the below community resources  If you have worsening or persistent distressing dreams or visual, auditory or tactile hallucinations, thoughts of self-harm or suicide, thoughts of homicide please go to the behavioral health urgent care

## 2021-01-01 ENCOUNTER — Emergency Department (HOSPITAL_COMMUNITY): Payer: Medicare Other

## 2021-01-01 ENCOUNTER — Encounter (HOSPITAL_COMMUNITY): Payer: Self-pay | Admitting: Emergency Medicine

## 2021-01-01 ENCOUNTER — Inpatient Hospital Stay (HOSPITAL_COMMUNITY)
Admission: EM | Admit: 2021-01-01 | Discharge: 2021-01-05 | DRG: 308 | Disposition: A | Discharge status: Home / Self Care | Payer: Medicare Other | Attending: Internal Medicine | Admitting: Internal Medicine

## 2021-01-01 ENCOUNTER — Other Ambulatory Visit: Payer: Self-pay

## 2021-01-01 DIAGNOSIS — E1121 Type 2 diabetes mellitus with diabetic nephropathy: Secondary | ICD-10-CM | POA: Diagnosis present

## 2021-01-01 DIAGNOSIS — N179 Acute kidney failure, unspecified: Secondary | ICD-10-CM | POA: Diagnosis present

## 2021-01-01 DIAGNOSIS — Z888 Allergy status to other drugs, medicaments and biological substances status: Secondary | ICD-10-CM

## 2021-01-01 DIAGNOSIS — E1142 Type 2 diabetes mellitus with diabetic polyneuropathy: Secondary | ICD-10-CM | POA: Diagnosis present

## 2021-01-01 DIAGNOSIS — R4182 Altered mental status, unspecified: Secondary | ICD-10-CM | POA: Diagnosis present

## 2021-01-01 DIAGNOSIS — E119 Type 2 diabetes mellitus without complications: Secondary | ICD-10-CM | POA: Diagnosis present

## 2021-01-01 DIAGNOSIS — F32A Depression, unspecified: Secondary | ICD-10-CM | POA: Diagnosis present

## 2021-01-01 DIAGNOSIS — Z825 Family history of asthma and other chronic lower respiratory diseases: Secondary | ICD-10-CM

## 2021-01-01 DIAGNOSIS — F1411 Cocaine abuse, in remission: Secondary | ICD-10-CM | POA: Diagnosis present

## 2021-01-01 DIAGNOSIS — I2510 Atherosclerotic heart disease of native coronary artery without angina pectoris: Secondary | ICD-10-CM | POA: Diagnosis present

## 2021-01-01 DIAGNOSIS — Z9862 Peripheral vascular angioplasty status: Secondary | ICD-10-CM

## 2021-01-01 DIAGNOSIS — I480 Paroxysmal atrial fibrillation: Secondary | ICD-10-CM | POA: Diagnosis not present

## 2021-01-01 DIAGNOSIS — Z79890 Hormone replacement therapy (postmenopausal): Secondary | ICD-10-CM

## 2021-01-01 DIAGNOSIS — Z89422 Acquired absence of other left toe(s): Secondary | ICD-10-CM

## 2021-01-01 DIAGNOSIS — Z7951 Long term (current) use of inhaled steroids: Secondary | ICD-10-CM

## 2021-01-01 DIAGNOSIS — F1110 Opioid abuse, uncomplicated: Secondary | ICD-10-CM | POA: Diagnosis present

## 2021-01-01 DIAGNOSIS — K7030 Alcoholic cirrhosis of liver without ascites: Secondary | ICD-10-CM | POA: Diagnosis present

## 2021-01-01 DIAGNOSIS — N184 Chronic kidney disease, stage 4 (severe): Secondary | ICD-10-CM | POA: Diagnosis present

## 2021-01-01 DIAGNOSIS — E1169 Type 2 diabetes mellitus with other specified complication: Secondary | ICD-10-CM | POA: Diagnosis present

## 2021-01-01 DIAGNOSIS — Z794 Long term (current) use of insulin: Secondary | ICD-10-CM

## 2021-01-01 DIAGNOSIS — R441 Visual hallucinations: Secondary | ICD-10-CM

## 2021-01-01 DIAGNOSIS — F1410 Cocaine abuse, uncomplicated: Secondary | ICD-10-CM | POA: Diagnosis present

## 2021-01-01 DIAGNOSIS — R443 Hallucinations, unspecified: Secondary | ICD-10-CM | POA: Diagnosis present

## 2021-01-01 DIAGNOSIS — J9601 Acute respiratory failure with hypoxia: Secondary | ICD-10-CM | POA: Diagnosis present

## 2021-01-01 DIAGNOSIS — IMO0002 Reserved for concepts with insufficient information to code with codable children: Secondary | ICD-10-CM | POA: Diagnosis present

## 2021-01-01 DIAGNOSIS — Z833 Family history of diabetes mellitus: Secondary | ICD-10-CM

## 2021-01-01 DIAGNOSIS — K219 Gastro-esophageal reflux disease without esophagitis: Secondary | ICD-10-CM | POA: Diagnosis present

## 2021-01-01 DIAGNOSIS — Z7901 Long term (current) use of anticoagulants: Secondary | ICD-10-CM

## 2021-01-01 DIAGNOSIS — J441 Chronic obstructive pulmonary disease with (acute) exacerbation: Secondary | ICD-10-CM | POA: Diagnosis present

## 2021-01-01 DIAGNOSIS — I739 Peripheral vascular disease, unspecified: Secondary | ICD-10-CM | POA: Diagnosis present

## 2021-01-01 DIAGNOSIS — F1010 Alcohol abuse, uncomplicated: Secondary | ICD-10-CM | POA: Diagnosis present

## 2021-01-01 DIAGNOSIS — E1165 Type 2 diabetes mellitus with hyperglycemia: Secondary | ICD-10-CM | POA: Diagnosis present

## 2021-01-01 DIAGNOSIS — E039 Hypothyroidism, unspecified: Secondary | ICD-10-CM | POA: Diagnosis present

## 2021-01-01 DIAGNOSIS — E669 Obesity, unspecified: Secondary | ICD-10-CM | POA: Diagnosis present

## 2021-01-01 DIAGNOSIS — I429 Cardiomyopathy, unspecified: Secondary | ICD-10-CM | POA: Diagnosis present

## 2021-01-01 DIAGNOSIS — I130 Hypertensive heart and chronic kidney disease with heart failure and stage 1 through stage 4 chronic kidney disease, or unspecified chronic kidney disease: Secondary | ICD-10-CM | POA: Diagnosis present

## 2021-01-01 DIAGNOSIS — Z7902 Long term (current) use of antithrombotics/antiplatelets: Secondary | ICD-10-CM

## 2021-01-01 DIAGNOSIS — E875 Hyperkalemia: Secondary | ICD-10-CM | POA: Diagnosis present

## 2021-01-01 DIAGNOSIS — F17290 Nicotine dependence, other tobacco product, uncomplicated: Secondary | ICD-10-CM | POA: Diagnosis present

## 2021-01-01 DIAGNOSIS — E785 Hyperlipidemia, unspecified: Secondary | ICD-10-CM | POA: Diagnosis present

## 2021-01-01 DIAGNOSIS — Z8249 Family history of ischemic heart disease and other diseases of the circulatory system: Secondary | ICD-10-CM

## 2021-01-01 DIAGNOSIS — E1151 Type 2 diabetes mellitus with diabetic peripheral angiopathy without gangrene: Secondary | ICD-10-CM | POA: Diagnosis present

## 2021-01-01 DIAGNOSIS — F419 Anxiety disorder, unspecified: Secondary | ICD-10-CM | POA: Diagnosis present

## 2021-01-01 DIAGNOSIS — N183 Chronic kidney disease, stage 3 unspecified: Secondary | ICD-10-CM | POA: Diagnosis present

## 2021-01-01 DIAGNOSIS — G4730 Sleep apnea, unspecified: Secondary | ICD-10-CM | POA: Diagnosis present

## 2021-01-01 DIAGNOSIS — F1210 Cannabis abuse, uncomplicated: Secondary | ICD-10-CM | POA: Diagnosis present

## 2021-01-01 DIAGNOSIS — Z89431 Acquired absence of right foot: Secondary | ICD-10-CM

## 2021-01-01 DIAGNOSIS — I5042 Chronic combined systolic (congestive) and diastolic (congestive) heart failure: Secondary | ICD-10-CM | POA: Diagnosis present

## 2021-01-01 DIAGNOSIS — J449 Chronic obstructive pulmonary disease, unspecified: Secondary | ICD-10-CM | POA: Diagnosis present

## 2021-01-01 DIAGNOSIS — Z79899 Other long term (current) drug therapy: Secondary | ICD-10-CM

## 2021-01-01 DIAGNOSIS — Z20822 Contact with and (suspected) exposure to covid-19: Secondary | ICD-10-CM | POA: Diagnosis present

## 2021-01-01 DIAGNOSIS — Z23 Encounter for immunization: Secondary | ICD-10-CM

## 2021-01-01 LAB — COMPREHENSIVE METABOLIC PANEL
ALT: 79 U/L — ABNORMAL HIGH (ref 0–44)
AST: 57 U/L — ABNORMAL HIGH (ref 15–41)
Albumin: 3.5 g/dL (ref 3.5–5.0)
Alkaline Phosphatase: 168 U/L — ABNORMAL HIGH (ref 38–126)
Anion gap: 13 (ref 5–15)
BUN: 64 mg/dL — ABNORMAL HIGH (ref 6–20)
CO2: 18 mmol/L — ABNORMAL LOW (ref 22–32)
Calcium: 8.4 mg/dL — ABNORMAL LOW (ref 8.9–10.3)
Chloride: 108 mmol/L (ref 98–111)
Creatinine, Ser: 2.93 mg/dL — ABNORMAL HIGH (ref 0.44–1.00)
GFR, Estimated: 18 mL/min — ABNORMAL LOW (ref >60)
Glucose, Bld: 87 mg/dL (ref 70–99)
Potassium: 5.5 mmol/L — ABNORMAL HIGH (ref 3.5–5.1)
Sodium: 139 mmol/L (ref 135–145)
Total Bilirubin: 0.7 mg/dL (ref 0.3–1.2)
Total Protein: 6.9 g/dL (ref 6.5–8.1)

## 2021-01-01 LAB — I-STAT VENOUS BLOOD GAS, ED
Acid-base deficit: 5 mmol/L — ABNORMAL HIGH (ref 0.0–2.0)
Bicarbonate: 21.1 mmol/L (ref 20.0–28.0)
Calcium, Ion: 1.01 mmol/L — ABNORMAL LOW (ref 1.15–1.40)
HCT: 34 % — ABNORMAL LOW (ref 36.0–46.0)
Hemoglobin: 11.6 g/dL — ABNORMAL LOW (ref 12.0–15.0)
O2 Saturation: 87 %
Potassium: 5.5 mmol/L — ABNORMAL HIGH (ref 3.5–5.1)
Sodium: 140 mmol/L (ref 135–145)
TCO2: 22 mmol/L (ref 22–32)
pCO2, Ven: 43.8 mmHg — ABNORMAL LOW (ref 44.0–60.0)
pH, Ven: 7.292 (ref 7.250–7.430)
pO2, Ven: 59 mmHg — ABNORMAL HIGH (ref 32.0–45.0)

## 2021-01-01 LAB — I-STAT CHEM 8, ED
BUN: 80 mg/dL — ABNORMAL HIGH (ref 6–20)
Calcium, Ion: 1.01 mmol/L — ABNORMAL LOW (ref 1.15–1.40)
Chloride: 114 mmol/L — ABNORMAL HIGH (ref 98–111)
Creatinine, Ser: 2.9 mg/dL — ABNORMAL HIGH (ref 0.44–1.00)
Glucose, Bld: 68 mg/dL — ABNORMAL LOW (ref 70–99)
HCT: 34 % — ABNORMAL LOW (ref 36.0–46.0)
Hemoglobin: 11.6 g/dL — ABNORMAL LOW (ref 12.0–15.0)
Potassium: 5.5 mmol/L — ABNORMAL HIGH (ref 3.5–5.1)
Sodium: 140 mmol/L (ref 135–145)
TCO2: 21 mmol/L — ABNORMAL LOW (ref 22–32)

## 2021-01-01 LAB — CBC
HCT: 39.2 % (ref 36.0–46.0)
Hemoglobin: 12.2 g/dL (ref 12.0–15.0)
MCH: 30.2 pg (ref 26.0–34.0)
MCHC: 31.1 g/dL (ref 30.0–36.0)
MCV: 97 fL (ref 80.0–100.0)
Platelets: 271 10*3/uL (ref 150–400)
RBC: 4.04 MIL/uL (ref 3.87–5.11)
RDW: 15.5 % (ref 11.5–15.5)
WBC: 9.2 10*3/uL (ref 4.0–10.5)
nRBC: 0 % (ref 0.0–0.2)

## 2021-01-01 LAB — AMMONIA: Ammonia: 37 umol/L — ABNORMAL HIGH (ref 9–35)

## 2021-01-01 LAB — CBG MONITORING, ED
Glucose-Capillary: 71 mg/dL (ref 70–99)
Glucose-Capillary: 65 mg/dL — ABNORMAL LOW (ref 70–99)

## 2021-01-01 LAB — TSH: TSH: 2.138 u[IU]/mL (ref 0.350–4.500)

## 2021-01-01 LAB — ETHANOL: Alcohol, Ethyl (B): <10 mg/dL (ref <10)

## 2021-01-01 LAB — SALICYLATE LEVEL: Salicylate Lvl: 37.1 mg/dL — ABNORMAL HIGH (ref 7.0–30.0)

## 2021-01-01 LAB — ACETAMINOPHEN LEVEL: Acetaminophen (Tylenol), Serum: <10 ug/mL — ABNORMAL LOW (ref 10–30)

## 2021-01-01 MED ORDER — ALBUTEROL SULFATE HFA 108 (90 BASE) MCG/ACT IN AERS
2.0000 | INHALATION_SPRAY | Freq: Once | RESPIRATORY_TRACT | Status: AC
  Administered 2021-01-01: 2 via RESPIRATORY_TRACT
  Filled 2021-01-01: qty 6.7

## 2021-01-01 MED ORDER — DEXTROSE 50 % IV SOLN
1.0000 | Freq: Once | INTRAVENOUS | Status: AC
  Administered 2021-01-02: 50 mL via INTRAVENOUS
  Filled 2021-01-01: qty 50

## 2021-01-01 MED ORDER — LORAZEPAM 2 MG/ML IJ SOLN
1.0000 mg | Freq: Once | INTRAMUSCULAR | Status: AC
  Administered 2021-01-01: 1 mg via INTRAVENOUS
  Filled 2021-01-01: qty 1

## 2021-01-01 NOTE — ED Notes (Signed)
Fluids provided, encouraged patient to drink water

## 2021-01-01 NOTE — Progress Notes (Signed)
Pt unable to stay awake, unable to ask medical questions. Called caregiver, with no answer. Sent pt back to hallway.

## 2021-01-01 NOTE — ED Notes (Signed)
Patient transported to MRI 

## 2021-01-01 NOTE — ED Triage Notes (Signed)
Pt arrives to ED with chief complaint of hallucinations for 3 days, she states it will be animals holding balloons or cartoons, she states she has been talking to them. She is alert and ox4. Denies any SI/HI. She does report recently starting a new medication for depression.

## 2021-01-01 NOTE — ED Provider Notes (Signed)
1530: Care assumed from previous EDPA at shift change. See previous note for full H&P.   Briefly patient here for visual hallucinations. H/o depression, ETOH and polysubstance abuse but denies recent. No SI or attempt, HI.  Does not have psych follow up.   Plan to follow up on MRI to r/o stroke, UDS. Consider TTS. Of note, I saw this patient recently at [facility] ED for nightmares.   ASA level 37.1, patient states she takes [facility] powders for headache 1-2 times daily. Denies attempt to overdose or self harm/suicide.   Creatinine, LFTs, AG elevated but comparable to previous.  Physical Exam  BP (!) 138/43 (BP Location: Right Arm)   Pulse 67   Temp 98.9 F (37.2 C) (Oral)   Resp 20   LMP 11/27/2012   SpO2 96%   Physical Exam Constitutional:      Appearance: She is well-developed.  HENT:     Head: Normocephalic.     Nose: Nose normal.  Eyes:     General: Lids are normal.  Cardiovascular:     Rate and Rhythm: Normal rate.  Pulmonary:     Effort: Pulmonary effort is normal. No respiratory distress.  Musculoskeletal:        General: Normal range of motion.     Cervical back: Normal range of motion.  Neurological:     Mental Status: She is alert.     Comments: Sitting up awake. Drinking water. Speech slightly slurred but patient has no teeth. Spontaneously moving all extremities.  Psychiatric:        Mood and Affect: Mood is anxious. Affect is tearful.        Behavior: Behavior normal.     Comments: Appears anxious - asking if she can take a shower because she is "burning up". Teary eyed.      ED Course/Procedures   Clinical Course as of 01/01/21 2304  Fri Jan 01, 2021  2028 Called MRI - tech states patient arrived to MRI "unresponsive", couldn't stay awake, couldn't answer questions.  Per MRI tech she wrote a progress note. RN and myself were not notified of patient status and reason why MRI was not done. I evaluated patient. She is snoring loudly, RN next to her. I attempted to arouse  patient verbally and she did not wake up, continued to snore loudly.  She woke up with painful stimulus only. Quickly falls back asleep. Gurgling noise and snoring. SpO2 >95, HR high 50s. EMT asked to check CBG. Chem 8, VBG ordered. Patient received 1 mg ativan 1723  for MRI [CG]  2237 Re-evaluated patient. On O2 because she desats when sleeping/snoring. Withdraws to pain, cannot open eyes or stay awake to answer questions  [CG]  2238 Re-evaluated patient again. Snoring loudly. Winces at pain. Does not wake up or open eyes. Will obtain CXR. Rhonchi/wheezing but snoring loudly as well  [CG]    Clinical Course User Index [CG] Kinnie Feil, PA-C    Procedures  MDM   1540: Met patient. Explained pending MRI possible TTS consultation. She is in agreement. Appears anxious, teary eyed. Ativan given.   2305: Patient had change in mental status soon after receiving ativan prior to MRI. Per MRI tech patient too somnolent and would not answer questions appropriately or open her eyes.  She was transported back to ER.  Patient has been observed for 3 hours in ER without improvement in mental status. Continues to be somnolent. Snoring loudly. Desaturated during sleep now on 2 L [place].  Patient evaluated by me and EDP several times.  Opens eyes to pain briefly but falls asleep quickly. Not answering questions and will mumble incomprehensible words. Withdrawing from pain. Protecting her airway.  Repeated chem 8, vbg and CBG. Creatinine 2.93 > 2.90, BUN 65 >80. ASA elevated but patient reports taking 2 BC powders daily for headaches. K 5.5 > 5.5. CBG 65, D50 ordered. Pending CXR, repeat head CT, UDS/UA.   At this time high suspicion for AMS due to BZD in setting of known CKD vs uremia, mild AKI. Although patient only received 1 mg ativan and BMI 39.  Patient will not be able to be discharged from ER without MRI.  Feel patient will do best admitted to the hospital for prolonged AMS, obs, MRI. Shared with EDP.    Appreciate Dr Sheppard Coil who will come see patient in ER.   Kinnie Feil, PA-C 01/01/21 2329    Quintella Reichert, MD 01/02/21 1227

## 2021-01-01 NOTE — ED Provider Notes (Signed)
[facility] EMERGENCY DEPARTMENT Provider Note   CSN: [id number] Arrival date & time: 01/01/21  1133     History Chief Complaint  Patient presents with  . Hallucinations    [name] is a 60 y.o. female presenting for evaluation of hallucinations.  Patient states that the past 7 to 10 days, she has had intermittent hallucinations.  She reports seeing a teddy bear surrounded by balloons.  This is more likely to occur at night, but can occur anytime.  She is not currently having any hallucinations.  No auditory component.  She denies SI or HI.  She states she was started on a new medication for anxiety about 2 weeks ago.  She thought this was the cause of her symptoms, discontinued it a week ago.  However she continues to have symptoms.  She does not know what medication this was.  Last hallucination was last night, ended around 2 AM.  She has not had any since.  She denies slurred speech, chest pain, shortness of breath, fever, cough, nausea, vomiting abdominal pain, urinary symptoms, abnormal bowel movements.  She does not know if any of her other home medications have been changed recently.  She does not currently have a psychiatrist or psychologist.  She reports cigarette use of 1 pack/day, reports intermittent alcohol use, none today or yesterday.  Reports marijuana use, but no other drug use.   Additional history obtained from chart review.  Patient with a history of alcohol use, anxiety, CAD, cardiomyopathy, CHF, CKD, COPD, diabetes, GERD, hypertension, hyperlipidemia, marijuana use, PAF on Eliquis polysubstance abuse.  Patient seen in the ED 8 days ago for nightmares, given outpatient resources.  HPI     Past Medical History:  Diagnosis Date  . Alcohol abuse   . Alcoholic cirrhosis ([place])   . Anemia   . Anxiety   . Arthritis    "knees" (11/26/2018)  . B12 deficiency   . CAD (coronary artery disease)    a. 11/10/2014 Cath: LM nl, LAD min irregs, D1 30 ost,  D2 50d, LCX 9m OM1 80 p/m (1.5 mm vessel), OM2 420mRCA nondom 9015mmed rx.. Demand ischemia in the setting of rapid a-fib.  . Cardiomyopathy (HCC[place] . Carotid artery disease (HCC[place]y Hollow  a. 01/2015 Carotid Angio: RICA [id number]. [id number] L CEA; c. 05/2019 Carotid U/S: RICA 100. RECA >50. LICA [id number]. Cellulitis    lower extremities  . Chronic combined systolic and diastolic CHF (congestive heart failure) (HCCMount Pleasant  a. 10/2014 Echo: EF 40-45%; b. 10/2018 Echo: EF 45-50%, gr2 DD; c. 11/2019 Echo: EF 50%, mild LVH, gr2 DD (restrictive), antlat HK, Nl RV fxn. Mild BAE. RVSP 59.9mm75m  . CKD (chronic kidney disease), stage III ([name])[place]. Cocaine abuse ([name])[place]. COPD (chronic obstructive pulmonary disease) ([name])[place]. Critical lower limb ischemia ([name])[employer]. Depression   . Diabetic peripheral neuropathy ([name])[place]. Dyspnea   . Elevated troponin    a. Chronic elevation.  . GEMarland KitchenD (gastroesophageal reflux disease)   . Hyperlipemia   . Hypertension   . Hypokalemia   . Hypomagnesemia   . Hypothyroidism   . Marijuana abuse   . Narcotic abuse ([name])[place]. Noncompliance   . NSVT (nonsustained ventricular tachycardia) ([name])[place]. Obesity   . PAF (paroxysmal atrial fibrillation) ([name])[place]. Paroxysmal atrial tachycardia ([name])[place]. Peripheral arterial disease (  [place])    a. 01/2015 Angio/PTA: RSFA 99 (atherectomy/pta) - 1 vessel runoff via diff dzs peroneal; b. 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  . Pneumonia    "once or twice" (11/26/2018)  . Poorly controlled type 2 diabetes mellitus ([place])   . Renal insufficiency    a. 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  [name] Sleep apnea    "couldn't handle wearing the mask" (11/26/2018)  . Symptomatic bradycardia    a. Avoid AV blocking agent per EP. Prev req temp wire in 2017.  . Tobacco abuse     Patient Active Problem List   Diagnosis Date Noted  . Hyperlipidemia 04/03/2020  . PAD (peripheral artery disease) ([place]) 12/26/2019  . Acute renal failure  ([place])   . Acute respiratory failure with hypoxia ([place]) 12/02/2019  . Acute exacerbation of CHF (congestive heart failure) ([place]) 12/01/2019  . Cellulitis in diabetic foot ([place]) 07/08/2019  . Osteomyelitis ([place]) 06/21/2019  . NICM (nonischemic cardiomyopathy) ([place]) 06/20/2019  . Non-healing ulcer ([place]) 06/20/2019  . Acute CHF (congestive heart failure) ([place]) 11/26/2018  . CHF (congestive heart failure) ([place]) 11/26/2018  . Acute respiratory failure ([place]) 10/21/2018  . AKI (acute kidney injury) ([place]) 08/18/2018  . Coagulation disorder ([place]) 08/09/2017  . Depression 07/21/2017  . At risk for adverse drug reaction 06/20/2017  . Peripheral neuropathy 06/20/2017  . Acute osteomyelitis of right foot ([place]) 06/13/2017  . S/P transmetatarsal amputation of foot, right ([place]) 06/05/2017  . 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 SIRS (systemic inflammatory response syndrome) ([name]) 04/06/2017  . CKD (chronic kidney disease), stage III ([place]) 11/24/2016  . Suspected sleep apnea 11/24/2016  . Ulcer of toe of right foot, with necrosis of bone ([place]) 10/27/2016  . Ulcer of left lower leg ([place]) 05/19/2016  . Severe obesity (BMI >= 40) ([place]) 02/24/2016  . COPD GOLD 0  02/24/2016  . Encounter for therapeutic drug monitoring 02/10/2016  . Symptomatic bradycardia 01/12/2016  . Essential hypertension 12/22/2015  . Chronic combined systolic and diastolic CHF (congestive heart failure) ([place])   . Wheeze   . 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 Tobacco abuse 10/23/2015  . Coronary artery disease   . DOE (dyspnea on exertion) 04/29/2015  . Diabetes mellitus type 2, uncontrolled ([place]) 02/08/2015  . Influenza A 02/07/2015  . Wrist fracture, left, with routine healing, subsequent encounter 02/05/2015  . Wrist fracture, left, closed, initial encounter 01/29/2015  . PAF (paroxysmal atrial  fibrillation) ([place]) 01/16/2015  . Carotid arterial disease ([place]) 01/16/2015  . Claudication ([place]) 01/15/2015  . Demand ischemia ([place]) 10/29/2014  . Insomnia 02/03/2014  . COPD with acute exacerbation ([place]) 02/01/2014  . S/P peripheral artery angioplasty - [place] atherectomy; R SFA 09/11/2013    Class: Acute  . Leg pain, bilateral 08/19/2013  . Hypothyroidism 07/31/2013  . Cellulitis 06/13/2013  . History of cocaine abuse ([place]) 06/13/2013  . Long term current use of anticoagulant therapy 05/20/2013  . Alcohol abuse   . Narcotic abuse ([place])   . Marijuana abuse   . Alcoholic cirrhosis ([place])   . DM (diabetes mellitus), type 2 with peripheral vascular complications [facility])     Past Surgical History:  Procedure Laterality Date  . ABDOMINAL AORTOGRAM N/A 06/26/2019   Procedure: ABDOMINAL AORTOGRAM;  Surgeon: Wellington Hampshire, MD;  Location: [facility] CV LAB;  Service: Cardiovascular;  Laterality: N/A;  .  AMPUTATION Right 06/14/2017   Procedure: Right foot transmetatarsal amputation;  Surgeon: Newt Minion, MD;  Location: [facility];  Service: Orthopedics;  Laterality: Right;  . AMPUTATION Left 06/28/2019   Procedure: AMPUTATION LEFT FIFTH TOE;  Surgeon: Rosetta Posner, MD;  Location: [facility];  Service: Vascular;  Laterality: Left;  . AMPUTATION TOE Right 04/28/2017   Procedure: AMPUTATION OF RIGHT SECOND RAY;  Surgeon: Newt Minion, MD;  Location: [facility];  Service: Orthopedics;  Laterality: Right;  . CARDIAC CATHETERIZATION    . CARDIAC CATHETERIZATION N/A 01/12/2016   Procedure: Temporary Wire;  Surgeon: Minus Breeding, MD;  Location: [facility] CV LAB;  Service: Cardiovascular;  Laterality: N/A;  . CARDIOVERSION  ~ 02/2013   "twice"   . CAROTID ANGIOGRAM N/A 01/15/2015   Procedure: CAROTID ANGIOGRAM;  Surgeon: [name], MD;  [facility] C[facility] LAB;  Service: Cardiovascular;  Laterality: N/A;  . DILATION AND CURETTAGE OF UTERUS  1988  . ENDARTERECTOMY Left 02/19/2015   Procedure: LEFT  CAROTID ENDARTERECTOMY ;  Surgeon: Serafina Mitchell, MD;  Location: [facility];  Service: Vascular;  Laterality: Left;  . FEMORAL-TIBIAL BYPASS GRAFT Left 06/28/2019   Procedure: BYPASS GRAFT LEFT LEG FEMORAL TO ANTERIOR TIBIAL ARTERY using LEFT GREATER SAPHENOUS VEIN;  Surgeon: Rosetta Posner, MD;  Location: [facility];  Service: Vascular;  Laterality: Left;  . LEFT HEART CATHETERIZATION WITH CORONARY ANGIOGRAM N/A 10/31/2014   Procedure: LEFT HEART CATHETERIZATION WITH CORONARY ANGIOGRAM;  Surgeon: Burnell Blanks, MD;  [facility] LAB;  Service: Cardiovascular;  Laterality: N/A;  . LOWER EXTREMITY ANGIOGRAM N/A 09/10/2013   Procedure: LOWER EXTREMITY ANGIOGRAM;  Surgeon: [name], MD;  Location: [facility] CATH LAB;  Service: Cardiovascular;  Laterality: N/A;  . LOWER EXTREMITY ANGIOGRAM N/A 01/15/2015   Procedure: LOWER EXTREMITY ANGIOGRAM;  Surgeon: [name], MD;  Location: [facility] CATH LAB;  Service: Cardiovascular;  Laterality: N/A;  . LOWER EXTREMITY ANGIOGRAPHY N/A 04/13/2017   Procedure: Lower Extremity Angiography;  Surgeon: [name], MD;  Location: [facility] CV LAB;  Service: Cardiovascular;  Laterality: N/A;  . LOWER EXTREMITY ANGIOGRAPHY Left 06/26/2019   Procedure: LOWER EXTREMITY ANGIOGRAPHY;  Surgeon: Wellington Hampshire, MD;  Location: [facility] CV LAB;  Service: Cardiovascular;  Laterality: Left;  . PERIPHERAL VASCULAR BALLOON ANGIOPLASTY Left 06/26/2019   Procedure: PERIPHERAL VASCULAR BALLOON ANGIOPLASTY;  Surgeon: Wellington Hampshire, MD;  Location: [facility] CV LAB;  Service: Cardiovascular;  Laterality: Left;  unable to cross lt sfa occlusion  . PERIPHERAL VASCULAR INTERVENTION  04/13/2017   Procedure: Peripheral Vascular Intervention;  Surgeon: [name], MD;  Location: [facility] CV LAB;  Service: Cardiovascular;;  . PRESSURE SENSOR/CARDIOMEMS N/A 02/05/2020   Procedure: PRESSURE SENSOR/CARDIOMEMS;  Surgeon: Larey Dresser, MD;  Location: [place] CV  LAB;  Service: Cardiovascular;  Laterality: N/A;  . VEIN HARVEST Left 06/28/2019   Procedure: LEFT LEG GREATER SAPHENOUS VEIN HARVEST;  Surgeon: Rosetta Posner, MD;  Location: [facility] OR;  Service: Vascular;  Laterality: Left;     OB History    Gravida  1   Para  1   Term  1   Preterm      AB      Living  1     SAB      IAB      Ectopic      Multiple      Live Births  Family History  Problem Relation Age of Onset  . Hypertension Mother   . Diabetes Mother   . Cancer Mother        breast, ovarian, colon  . Clotting disorder Mother   . Heart disease Mother   . Heart attack Mother   . Breast cancer Mother        in 79's  . Hypertension Father   . Heart disease Father   . Emphysema Sister        smoked    Social History   Tobacco Use  . Smoking status: Current Some Day Smoker    Packs/day: 1.00    Years: 44.00    Pack years: 44.00    Types: E-cigarettes, Cigarettes  . Smokeless tobacco: Former Systems developer    Types: Snuff  Vaping Use  . Vaping Use: Former  . Devices: 11/26/2018 "stopped months ago"  Substance Use Topics  . Alcohol use: Yes    Comment: occ  . Drug use: Yes    Types: "Crack" cocaine, Marijuana, Oxycodone    Home Medications Prior to Admission medications   Medication Sig Start Date End Date Taking? Authorizing Provider  ACCU-CHEK GUIDE test strip USE TO CHECK BLOOD SUGAR FOUR TIMES DAILY 02/25/20   [provider]  albuterol (VENTOLIN HFA) 108 (90 Base) MCG/ACT inhaler Inhale 2 puffs into the lungs every 6 (six) hours as needed for wheezing or shortness of breath. 07/08/19   Oretha Milch D, MD  amiodarone (PACERONE) 200 MG tablet Take 0.5 tablets (100 mg total) by mouth daily. 10/15/20   Larey Dresser, MD  amLODipine (NORVASC) 10 MG tablet Take 1 tablet (10 mg total) by mouth daily. 10/15/20   Larey Dresser, MD  apixaban (ELIQUIS) 5 MG TABS tablet TAKE 1 TABLET(5 MG) BY MOUTH TWICE DAILY 10/15/20   Larey Dresser, MD   bisoprolol (ZEBETA) 5 MG tablet Take 1 tablet (5 mg total) by mouth daily. 10/15/20   Larey Dresser, MD  budesonide-formoterol [facility]) 160-4.5 MCG/ACT inhaler Inhale 2 puffs into the lungs 2 (two) times daily. 12/09/19   Eugenie Filler, MD  celecoxib (CELEBREX) 200 MG capsule Take 200 mg by mouth 2 (two) times daily. Patient not taking: No sig reported 09/03/20   [provider]  cholecalciferol (VITAMIN D) 1000 units tablet Take 1 tablet (1,000 Units total) by mouth daily. 07/08/19   Oretha Milch D, MD  clopidogrel (PLAVIX) 75 MG tablet Take 1 tablet (75 mg total) by mouth daily. 10/15/20   Larey Dresser, MD  D3-1000 25 MCG (1000 UT) capsule Take 1,000 Units by mouth daily. 09/03/20   [provider]  diclofenac Sodium (VOLTAREN) 1 % GEL as needed.    [provider]  empagliflozin (JARDIANCE) 10 MG TABS tablet TAKE 1 TABLET BY MOUTH DAILY BEFORE BREAKFAST 10/30/20   Larey Dresser, MD  folic acid (FOLVITE) 1 MG tablet Take 1 tablet (1 mg total) by mouth daily. 07/08/19   Desiree Hane, MD  Insulin Glargine (LANTUS SOLOSTAR) 100 UNIT/ML Solostar Pen Inject 40 Units into the skin at bedtime. Patient taking differently: Inject 30 Units into the skin at bedtime. 12/09/19   Eugenie Filler, MD  Insulin Lispro (HUMALOG KWIKPEN) 200 UNIT/ML SOPN Inject 8 Units into the skin 3 (three) times daily with meals. 07/08/19   Desiree Hane, MD  levothyroxine (SYNTHROID, LEVOTHROID) 50 MCG tablet Take 1 tablet (50 mcg total) by mouth daily. 08/09/17   Medina-Vargas, Senaida Lange, NP  losartan (COZAAR) 100 MG tablet Take 1 tablet (100 mg total) by mouth daily. 10/15/20   Larey Dresser, MD  nortriptyline (PAMELOR) 50 MG capsule Take 50 mg by mouth at bedtime. 11/28/19   [provider]  pantoprazole (PROTONIX) 40 MG tablet Take 1 tablet (40 mg total) by mouth daily. 02/21/20   Larey Dresser, MD  spironolactone (ALDACTONE) 25 MG tablet Take 1 tablet (25 mg  total) by mouth daily. 10/15/20   Larey Dresser, MD  tiotropium (SPIRIVA HANDIHALER) 18 MCG inhalation capsule Place 1 capsule (18 mcg total) into inhaler and inhale every morning. 12/09/19   Eugenie Filler, MD  torsemide (DEMADEX) 20 MG tablet Take 3 tablets (60 mg total) by mouth every morning AND 2 tablets (40 mg total) every evening. 10/15/20   Larey Dresser, MD  atorvastatin (LIPITOR) 40 MG tablet TAKE 1 TABLET BY MOUTH EVERY EVENING 06/15/20 10/15/20  Clegg, [name] D, NP  colchicine 0.6 MG tablet TAKE 2 TABLETS BY MOUTH AT ONSET OF GOUT FLARE, THEN TAKE 1 TABLET IN 1 HOUR, THEN STOP 07/21/20 10/07/20  Larey Dresser, MD    Allergies    Gabapentin and Lyrica [pregabalin]  Review of Systems   Review of Systems  Psychiatric/Behavioral: Positive for hallucinations.  All other systems reviewed and are negative.   Physical Exam Updated Vital Signs BP (!) 117/55   Pulse (!) 50   Temp 98.9 F (37.2 C) (Oral)   Resp 18   LMP 11/27/2012   SpO2 98%   Physical Exam Vitals and nursing note reviewed.  Constitutional:      General: She is not in acute distress.    Appearance: She is well-developed and well-nourished.     Comments: Appears chronically ill, nontoxic  HENT:     Head: Normocephalic and atraumatic.  Eyes:     Extraocular Movements: Extraocular movements intact and EOM normal.     Conjunctiva/sclera: Conjunctivae normal.     Pupils: Pupils are equal, round, and reactive to light.  Cardiovascular:     Rate and Rhythm: Normal rate and regular rhythm.     Pulses: Normal pulses and intact distal pulses.  Pulmonary:     Effort: Pulmonary effort is normal. No respiratory distress.     Breath sounds: Wheezing present.     Comments: expiratory wheezing. Speaking in full sentences. SpO2 stable on RA Abdominal:     General: There is no distension.     Palpations: Abdomen is soft. There is no mass.     Tenderness: There is no abdominal tenderness. There is no guarding or  rebound.  Musculoskeletal:        General: Normal range of motion.     Cervical back: Normal range of motion and neck supple.     Right lower leg: No edema.     Left lower leg: No edema.  Skin:    General: Skin is warm and dry.     Capillary Refill: Capillary refill takes less than 2 seconds.  Neurological:     Mental Status: She is alert and oriented to person, place, and time.  Psychiatric:        Attention and Perception: She perceives visual (denies currently) hallucinations. She does not perceive auditory hallucinations.        Mood and Affect: Mood and affect normal.        Thought Content: Thought content does not include homicidal or suicidal ideation. Thought content does not include homicidal or suicidal plan.  ED Results / Procedures / Treatments   Labs (all labs ordered are listed, but only abnormal results are displayed) Labs Reviewed  COMPREHENSIVE METABOLIC PANEL - Abnormal; Notable for the following components:      Result Value   Potassium 5.5 (*)    CO2 18 (*)    BUN 64 (*)    Creatinine, Ser 2.93 (*)    Calcium 8.4 (*)    AST 57 (*)    ALT 79 (*)    Alkaline Phosphatase 168 (*)    GFR, Estimated 18 (*)    All other components within normal limits  ETHANOL  CBC  RAPID URINE DRUG SCREEN, HOSP PERFORMED  SALICYLATE LEVEL  ACETAMINOPHEN LEVEL  TSH  AMMONIA    EKG None  Radiology CT Head Wo Contrast  Result Date: 01/01/2021 CLINICAL DATA:  Psychotic symptoms for 3 days. EXAM: CT HEAD WITHOUT CONTRAST TECHNIQUE: Contiguous axial images were obtained from the base of the skull through the vertex without intravenous contrast. COMPARISON:  Head CT 09/29/2018. FINDINGS: Brain: No evidence of acute infarction, hemorrhage, hydrocephalus, extra-axial collection or mass lesion/mass effect. 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 Vascular: No hyperdense vessel or unexpected calcification. Skull: Intact.  No focal lesion. Sinuses/Orbits: Status  post cataract surgery.  Otherwise negative. Other: None. IMPRESSION: No acute abnormality. 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 Electronically Signed   By: Inge Rise M.D.   On: 01/01/2021 14:12    Procedures Procedures   Medications Ordered in ED Medications  albuterol (VENTOLIN HFA) 108 (90 Base) MCG/ACT inhaler 2 puff (has no administration in time range)    ED Course  I have reviewed the triage vital signs and the nursing notes.  Pertinent labs & imaging results that were available during my care of the patient were reviewed by me and considered in my medical decision making (see chart for details).    MDM Rules/Calculators/A&P                          Patient presented for evaluation of visual hallucinations.  They are intermittent, none currently.  On exam, patient appears nontoxic.  She has significant medical history, as such, will obtain work-up to ensure no organic cause of patient's hallucinations.  Consider medication side effect, though less likely as medicine was stopped a week ago.  Consider this due to substance abuse including marijuana.  Consider psychiatric cause.  If medical work-up is negative, consider need for TTS.  Discussed with attending, Dr. Billy Fischer agrees to plan.  Labs show mild hyperkalemia of 5.5, will treat with albuterol as patient also has wheezing.  She does appear mildly dehydrated with a creatinine of 2.9, however this is not far from her baseline.  Will encourage p.o.  Will hold off on IV fluids as patient has a history of heart failure.  CBC overall reassuring.  Ethanol negative.  CT head negative for acute findings.  MRI pending.  Pt signed out to Shary Key, PA-C for f/u on MRI.   Final Clinical Impression(s) / ED Diagnoses Final diagnoses:  None    Rx / DC Orders ED Discharge Orders    None       Franchot Heidelberg, PA-C 01/01/21 1433    [name], MD 01/01/21 2346

## 2021-01-01 NOTE — ED Notes (Signed)
Assumed care at this pt from April, RN. pt AO x 4, on hallway bed, bilateral rails up. NAD noticed.

## 2021-01-02 ENCOUNTER — Observation Stay (HOSPITAL_COMMUNITY): Payer: Medicare Other

## 2021-01-02 DIAGNOSIS — I739 Peripheral vascular disease, unspecified: Secondary | ICD-10-CM

## 2021-01-02 DIAGNOSIS — E1121 Type 2 diabetes mellitus with diabetic nephropathy: Secondary | ICD-10-CM

## 2021-01-02 DIAGNOSIS — R400 Somnolence: Secondary | ICD-10-CM

## 2021-01-02 DIAGNOSIS — I480 Paroxysmal atrial fibrillation: Secondary | ICD-10-CM | POA: Diagnosis not present

## 2021-01-02 DIAGNOSIS — N184 Chronic kidney disease, stage 4 (severe): Secondary | ICD-10-CM

## 2021-01-02 DIAGNOSIS — N179 Acute kidney failure, unspecified: Secondary | ICD-10-CM | POA: Diagnosis present

## 2021-01-02 DIAGNOSIS — E118 Type 2 diabetes mellitus with unspecified complications: Secondary | ICD-10-CM

## 2021-01-02 DIAGNOSIS — R410 Disorientation, unspecified: Secondary | ICD-10-CM

## 2021-01-02 DIAGNOSIS — J449 Chronic obstructive pulmonary disease, unspecified: Secondary | ICD-10-CM

## 2021-01-02 DIAGNOSIS — IMO0002 Reserved for concepts with insufficient information to code with codable children: Secondary | ICD-10-CM | POA: Diagnosis present

## 2021-01-02 DIAGNOSIS — I2510 Atherosclerotic heart disease of native coronary artery without angina pectoris: Secondary | ICD-10-CM

## 2021-01-02 DIAGNOSIS — R4182 Altered mental status, unspecified: Secondary | ICD-10-CM | POA: Diagnosis present

## 2021-01-02 DIAGNOSIS — E1165 Type 2 diabetes mellitus with hyperglycemia: Secondary | ICD-10-CM

## 2021-01-02 LAB — CBC
HCT: 41.9 % (ref 36.0–46.0)
Hemoglobin: 12.4 g/dL (ref 12.0–15.0)
MCH: 29.2 pg (ref 26.0–34.0)
MCHC: 29.6 g/dL — ABNORMAL LOW (ref 30.0–36.0)
MCV: 98.8 fL (ref 80.0–100.0)
Platelets: 264 10*3/uL (ref 150–400)
RBC: 4.24 MIL/uL (ref 3.87–5.11)
RDW: 15.5 % (ref 11.5–15.5)
WBC: 10.9 10*3/uL — ABNORMAL HIGH (ref 4.0–10.5)
nRBC: 0 % (ref 0.0–0.2)

## 2021-01-02 LAB — I-STAT ARTERIAL BLOOD GAS, ED
Acid-base deficit: 6 mmol/L — ABNORMAL HIGH (ref 0.0–2.0)
Bicarbonate: 19.5 mmol/L — ABNORMAL LOW (ref 20.0–28.0)
Calcium, Ion: 1.15 mmol/L (ref 1.15–1.40)
HCT: 33 % — ABNORMAL LOW (ref 36.0–46.0)
Hemoglobin: 11.2 g/dL — ABNORMAL LOW (ref 12.0–15.0)
O2 Saturation: 95 %
Patient temperature: 98.6
Potassium: 4.4 mmol/L (ref 3.5–5.1)
Sodium: 141 mmol/L (ref 135–145)
TCO2: 21 mmol/L — ABNORMAL LOW (ref 22–32)
pCO2 arterial: 36.6 mmHg (ref 32.0–48.0)
pH, Arterial: 7.334 — ABNORMAL LOW (ref 7.350–7.450)
pO2, Arterial: 78 mmHg — ABNORMAL LOW (ref 83.0–108.0)

## 2021-01-02 LAB — URINALYSIS, ROUTINE W REFLEX MICROSCOPIC
Bilirubin Urine: NEGATIVE
Glucose, UA: 150 mg/dL — AB
Hgb urine dipstick: NEGATIVE
Ketones, ur: NEGATIVE mg/dL
Leukocytes,Ua: NEGATIVE
Nitrite: NEGATIVE
Protein, ur: NEGATIVE mg/dL
Specific Gravity, Urine: 1.016 (ref 1.005–1.030)
pH: 5 (ref 5.0–8.0)

## 2021-01-02 LAB — COMPREHENSIVE METABOLIC PANEL
ALT: 103 U/L — ABNORMAL HIGH (ref 0–44)
AST: 87 U/L — ABNORMAL HIGH (ref 15–41)
Albumin: 3.4 g/dL — ABNORMAL LOW (ref 3.5–5.0)
Alkaline Phosphatase: 160 U/L — ABNORMAL HIGH (ref 38–126)
Anion gap: 15 (ref 5–15)
BUN: 62 mg/dL — ABNORMAL HIGH (ref 6–20)
CO2: 17 mmol/L — ABNORMAL LOW (ref 22–32)
Calcium: 8.5 mg/dL — ABNORMAL LOW (ref 8.9–10.3)
Chloride: 108 mmol/L (ref 98–111)
Creatinine, Ser: 2.58 mg/dL — ABNORMAL HIGH (ref 0.44–1.00)
GFR, Estimated: 21 mL/min — ABNORMAL LOW (ref >60)
Glucose, Bld: 125 mg/dL — ABNORMAL HIGH (ref 70–99)
Potassium: 4.3 mmol/L (ref 3.5–5.1)
Sodium: 140 mmol/L (ref 135–145)
Total Bilirubin: 0.3 mg/dL (ref 0.3–1.2)
Total Protein: 7 g/dL (ref 6.5–8.1)

## 2021-01-02 LAB — HEMOGLOBIN A1C
Hgb A1c MFr Bld: 8.2 % — ABNORMAL HIGH (ref 4.8–5.6)
Mean Plasma Glucose: 188.64 mg/dL

## 2021-01-02 LAB — ETHANOL: Alcohol, Ethyl (B): <10 mg/dL (ref <10)

## 2021-01-02 LAB — CBG MONITORING, ED
Glucose-Capillary: 182 mg/dL — ABNORMAL HIGH (ref 70–99)
Glucose-Capillary: 100 mg/dL — ABNORMAL HIGH (ref 70–99)
Glucose-Capillary: 116 mg/dL — ABNORMAL HIGH (ref 70–99)
Glucose-Capillary: 100 mg/dL — ABNORMAL HIGH (ref 70–99)

## 2021-01-02 LAB — RAPID URINE DRUG SCREEN, HOSP PERFORMED
Amphetamines: NOT DETECTED
Barbiturates: NOT DETECTED
Benzodiazepines: NOT DETECTED
Cocaine: NOT DETECTED
Opiates: NOT DETECTED
Tetrahydrocannabinol: NOT DETECTED

## 2021-01-02 LAB — GLUCOSE, CAPILLARY
Glucose-Capillary: 169 mg/dL — ABNORMAL HIGH (ref 70–99)
Glucose-Capillary: 283 mg/dL — ABNORMAL HIGH (ref 70–99)

## 2021-01-02 LAB — RESP PANEL BY RT-PCR (FLU A&B, COVID) ARPGX2
Influenza A by PCR: NEGATIVE
Influenza B by PCR: NEGATIVE
SARS Coronavirus 2 by RT PCR: NEGATIVE

## 2021-01-02 LAB — HIV ANTIBODY (ROUTINE TESTING W REFLEX): HIV Screen 4th Generation wRfx: NONREACTIVE

## 2021-01-02 LAB — AMMONIA: Ammonia: 30 umol/L (ref 9–35)

## 2021-01-02 MED ORDER — SODIUM CHLORIDE 0.9 % IV SOLN
1.0000 g | INTRAVENOUS | Status: DC
  Administered 2021-01-02 – 2021-01-04 (×3): 1 g via INTRAVENOUS
  Filled 2021-01-02 (×3): qty 10

## 2021-01-02 MED ORDER — ALLOPURINOL 100 MG PO TABS
100.0000 mg | ORAL_TABLET | Freq: Every day | ORAL | Status: DC
Start: 2021-01-02 — End: 2021-01-05
  Administered 2021-01-02 – 2021-01-05 (×4): 100 mg via ORAL
  Filled 2021-01-02 (×5): qty 1

## 2021-01-02 MED ORDER — CLOPIDOGREL BISULFATE 75 MG PO TABS
75.0000 mg | ORAL_TABLET | Freq: Every day | ORAL | Status: DC
  Administered 2021-01-02 – 2021-01-05 (×4): 75 mg via ORAL
  Filled 2021-01-02 (×4): qty 1

## 2021-01-02 MED ORDER — GLUCOSE 40 % PO GEL
1.0000 | Freq: Once | ORAL | Status: AC
  Administered 2021-01-02: 37.5 g via ORAL
  Filled 2021-01-02: qty 1

## 2021-01-02 MED ORDER — BISOPROLOL FUMARATE 5 MG PO TABS
5.0000 mg | ORAL_TABLET | Freq: Every day | ORAL | Status: DC
  Administered 2021-01-03 – 2021-01-05 (×3): 5 mg via ORAL
  Filled 2021-01-02 (×4): qty 1

## 2021-01-02 MED ORDER — FOLIC ACID 1 MG PO TABS
1.0000 mg | ORAL_TABLET | Freq: Every day | ORAL | Status: DC
  Administered 2021-01-02 – 2021-01-05 (×4): 1 mg via ORAL
  Filled 2021-01-02 (×4): qty 1

## 2021-01-02 MED ORDER — NICOTINE 21 MG/24HR TD PT24
21.0000 mg | MEDICATED_PATCH | Freq: Every day | TRANSDERMAL | Status: DC
  Administered 2021-01-02 – 2021-01-05 (×4): 21 mg via TRANSDERMAL
  Filled 2021-01-02 (×4): qty 1

## 2021-01-02 MED ORDER — TORSEMIDE 20 MG PO TABS
20.0000 mg | ORAL_TABLET | Freq: Two times a day (BID) | ORAL | Status: DC
  Administered 2021-01-02 – 2021-01-03 (×3): 20 mg via ORAL
  Filled 2021-01-02 (×4): qty 1

## 2021-01-02 MED ORDER — AMLODIPINE BESYLATE 10 MG PO TABS
10.0000 mg | ORAL_TABLET | Freq: Every day | ORAL | Status: DC
  Administered 2021-01-02 – 2021-01-05 (×4): 10 mg via ORAL
  Filled 2021-01-02: qty 1
  Filled 2021-01-02: qty 2
  Filled 2021-01-02 (×2): qty 1

## 2021-01-02 MED ORDER — IPRATROPIUM-ALBUTEROL 0.5-2.5 (3) MG/3ML IN SOLN
3.0000 mL | Freq: Four times a day (QID) | RESPIRATORY_TRACT | Status: DC
  Administered 2021-01-02 – 2021-01-04 (×7): 3 mL via RESPIRATORY_TRACT
  Filled 2021-01-02 (×7): qty 3

## 2021-01-02 MED ORDER — SODIUM BICARBONATE 8.4 % IV SOLN
INTRAVENOUS | Status: DC
  Filled 2021-01-02 (×2): qty 850

## 2021-01-02 MED ORDER — ALBUTEROL SULFATE HFA 108 (90 BASE) MCG/ACT IN AERS: 2.0000 | INHALATION_SPRAY | Freq: Four times a day (QID) | RESPIRATORY_TRACT | Status: DC | PRN

## 2021-01-02 MED ORDER — UMECLIDINIUM BROMIDE 62.5 MCG/INH IN AEPB
1.0000 | INHALATION_SPRAY | Freq: Every day | RESPIRATORY_TRACT | Status: DC
  Administered 2021-01-03 – 2021-01-05 (×3): 1 via RESPIRATORY_TRACT
  Filled 2021-01-02: qty 7

## 2021-01-02 MED ORDER — TIOTROPIUM BROMIDE MONOHYDRATE 18 MCG IN CAPS: 18.0000 ug | ORAL_CAPSULE | Freq: Every morning | RESPIRATORY_TRACT | Status: DC

## 2021-01-02 MED ORDER — INSULIN ASPART 100 UNIT/ML IV SOLN
5.0000 [IU] | Freq: Once | INTRAVENOUS | Status: AC
  Administered 2021-01-02: 5 [IU] via INTRAVENOUS

## 2021-01-02 MED ORDER — INSULIN GLARGINE 100 UNIT/ML SOLOSTAR PEN: 40.0000 [IU] | PEN_INJECTOR | Freq: Every day | SUBCUTANEOUS | Status: DC

## 2021-01-02 MED ORDER — LOSARTAN POTASSIUM 50 MG PO TABS
100.0000 mg | ORAL_TABLET | Freq: Every day | ORAL | Status: DC
  Administered 2021-01-02 – 2021-01-05 (×4): 100 mg via ORAL
  Filled 2021-01-02 (×4): qty 2

## 2021-01-02 MED ORDER — LEVOTHYROXINE SODIUM 50 MCG PO TABS
50.0000 ug | ORAL_TABLET | Freq: Every day | ORAL | Status: DC
  Administered 2021-01-02 – 2021-01-05 (×4): 50 ug via ORAL
  Filled 2021-01-02 (×4): qty 1

## 2021-01-02 MED ORDER — MOMETASONE FURO-FORMOTEROL FUM 200-5 MCG/ACT IN AERO
2.0000 | INHALATION_SPRAY | Freq: Two times a day (BID) | RESPIRATORY_TRACT | Status: DC
  Administered 2021-01-03 – 2021-01-05 (×5): 2 via RESPIRATORY_TRACT
  Filled 2021-01-02 (×2): qty 8.8

## 2021-01-02 MED ORDER — PANTOPRAZOLE SODIUM 40 MG PO TBEC
40.0000 mg | DELAYED_RELEASE_TABLET | Freq: Every day | ORAL | Status: DC
  Administered 2021-01-02 – 2021-01-05 (×4): 40 mg via ORAL
  Filled 2021-01-02 (×4): qty 1

## 2021-01-02 MED ORDER — INSULIN ASPART 100 UNIT/ML ~~LOC~~ SOLN
0.0000 [IU] | Freq: Three times a day (TID) | SUBCUTANEOUS | Status: DC
  Administered 2021-01-02: 2 [IU] via SUBCUTANEOUS
  Administered 2021-01-03: 5 [IU] via SUBCUTANEOUS
  Administered 2021-01-03: 9 [IU] via SUBCUTANEOUS
  Administered 2021-01-03: 2 [IU] via SUBCUTANEOUS
  Administered 2021-01-04: 3 [IU] via SUBCUTANEOUS
  Administered 2021-01-04: 5 [IU] via SUBCUTANEOUS
  Administered 2021-01-04: 2 [IU] via SUBCUTANEOUS
  Administered 2021-01-05: 3 [IU] via SUBCUTANEOUS
  Administered 2021-01-05: 2 [IU] via SUBCUTANEOUS

## 2021-01-02 MED ORDER — HEPARIN SODIUM (PORCINE) 5000 UNIT/ML IJ SOLN: 5000.0000 [IU] | Freq: Three times a day (TID) | INTRAMUSCULAR | Status: DC

## 2021-01-02 MED ORDER — APIXABAN 5 MG PO TABS
5.0000 mg | ORAL_TABLET | Freq: Two times a day (BID) | ORAL | Status: DC
  Administered 2021-01-02 – 2021-01-05 (×7): 5 mg via ORAL
  Filled 2021-01-02 (×7): qty 1

## 2021-01-02 MED ORDER — SODIUM CHLORIDE 0.9 % IV SOLN
500.0000 mg | INTRAVENOUS | Status: DC
  Administered 2021-01-02: 500 mg via INTRAVENOUS
  Filled 2021-01-02 (×4): qty 500

## 2021-01-02 MED ORDER — COLCHICINE 0.6 MG PO TABS
0.6000 mg | ORAL_TABLET | Freq: Every day | ORAL | Status: DC | PRN
  Administered 2021-01-03 – 2021-01-04 (×2): 0.6 mg via ORAL
  Filled 2021-01-02 (×2): qty 1

## 2021-01-02 MED ORDER — AMIODARONE HCL 200 MG PO TABS
100.0000 mg | ORAL_TABLET | Freq: Every day | ORAL | Status: DC
  Administered 2021-01-02 – 2021-01-05 (×4): 100 mg via ORAL
  Filled 2021-01-02 (×4): qty 1

## 2021-01-02 MED ORDER — SPIRONOLACTONE 25 MG PO TABS
25.0000 mg | ORAL_TABLET | Freq: Every day | ORAL | Status: DC
  Administered 2021-01-03: 25 mg via ORAL
  Filled 2021-01-02: qty 1

## 2021-01-02 MED ORDER — METHYLPREDNISOLONE SODIUM SUCC 125 MG IJ SOLR
60.0000 mg | Freq: Two times a day (BID) | INTRAMUSCULAR | Status: DC
  Administered 2021-01-02 – 2021-01-03 (×3): 60 mg via INTRAVENOUS
  Filled 2021-01-02 (×3): qty 0.96
  Filled 2021-01-02: qty 2
  Filled 2021-01-02: qty 0.96

## 2021-01-02 MED ORDER — ATORVASTATIN CALCIUM 40 MG PO TABS
40.0000 mg | ORAL_TABLET | Freq: Every evening | ORAL | Status: DC
  Administered 2021-01-02 – 2021-01-04 (×3): 40 mg via ORAL
  Filled 2021-01-02 (×3): qty 1

## 2021-01-02 NOTE — Plan of Care (Signed)
  Problem: Clinical Measurements: Goal: Will remain free from infection Outcome: Progressing Goal: Diagnostic test results will improve Outcome: Progressing   Problem: Activity: Goal: Risk for activity intolerance will decrease Outcome: Progressing   

## 2021-01-02 NOTE — Progress Notes (Signed)
Pt arrived to unit from ED accompanied by staff. Pt arousable in no apparent respiratory distress noted. Pt orientated/situated to room/equipments. Welcome guide / menu provided to pt with instructions. Pt verbalized understanding of instructions. Hospital valuables policy has been discussed with no complaints.  Bed in lowest position with 3 side rails up, call bell/room phone within reach and all wheels locked.

## 2021-01-02 NOTE — Progress Notes (Signed)
PROGRESS NOTE    [name]  [id number] DOB: 11-13-1961 DOA: 01/01/2021 PCP: Loretha Brasil, FNP (Inactive)     Brief Narrative:  60 y.o. WF PMHx Depression, Anxiety,, Polysubstance abuse (EtOH abuse, cocaine abuse, narcotic abuse, marijuana use),  type II uncontrolled with peripheral neuropathy, Paroxysmal atrial fibrillation anticoagulated on Eliquis  HPI/review of systems unable to be obtained on initial interview with patient.  ED Course: Initially presented complaining of hallucinations.  Ativan was provided prior to taking patient down for MRI, on arrival to MRI patient was quite somnolent, MRI had to be canceled.  Patient was arousable but not conversant, not following commands.  Presumed Ativan adverse effect, repeat labs showed no obvious other metabolic derangement, showed stable COPD no acute change, kept for observation.  UDS pending   Subjective: A/O x4, positive S OB, positive continue tobacco use.   Assessment & Plan: Covid vaccination; vaccinated 3/3   Active Problems:   DM (diabetes mellitus), type 2 with peripheral vascular complications ([place])   History of cocaine abuse ([place])   Hypothyroidism   S/P peripheral artery angioplasty - TurboHawk atherectomy; R SFA   Claudication ([name])   PAF (paroxysmal atrial fibrillation) ([name])   COPD GOLD 0    CKD (chronic kidney disease), stage III ([name])   AKI (acute kidney injury) ([place])   PAD (peripheral artery disease) ([name])   Altered mental status   AF (paroxysmal atrial fibrillation) ([name])   CAD (coronary artery disease)   PVD (peripheral vascular disease) ([place])   Acute renal failure superimposed on stage 4 chronic kidney disease ([place])   Diabetes mellitus type 2, uncontrolled, with complications ([name])   Diabetic nephropathy ([name])  Paroxysmal atrial fibrillation -Amiodarone 1 mg daily -Norvasc 10 mg daily -Eliquis 5 mg BID -Bisoprolol 5 mg daily -Spironolactone 25 mg daily (hold) worsening renal  failure -Torsemide 20 mg BID (hold) worsening renal failure   CAD/PVD -See atrial fibrillation  Acute respiratory failure with hypoxia/COPD exacerbation -Solu-Medrol 60 mg bid -DuoNeb QID -Incentive spirometry -Flutter valve -Continuous pulse ox  Tobacco abuse -Counseled at length concerning the fact that she has significant COPD and should never smoke again. -Nicotine patch  Acute mental status change -Most likely iatrogenic secondary to Ativan use for MRI -MRI brain nondiagnostic see results below -2/19 UDS negative  Acute on CKD stage IV (baseline Cr 2.05) Lab Results  Component Value Date   CREATININE 2.58 (H) 01/02/2021   CREATININE 2.90 (H) 01/01/2021   CREATININE 2.93 (H) 01/01/2021   CREATININE 2.05 (H) 09/10/2020   CREATININE 2.07 (H) 09/10/2020  -See A. Fib -Sodium bicarb 27m/hr -Strict in and out -Daily weight  DM type II uncontrolled with complication/DM nephropathy -2/19 hemoglobin A1c= 8.2 -Lantus 40 units daily -Sensitive SSI    DVT prophylaxis: Eliquis Code Status: Full Family Communication:  Status is: Inpatient    Dispo: The patient is from: Home              Anticipated d/c is to: Home              Anticipated d/c date is: 2/24              Patient currently unstable      Consultants:    Procedures/Significant Events:    I have personally reviewed and interpreted all radiology studies and my findings are as above.  VENTILATOR SETTINGS:    Cultures 2/19 SARS coronavirus negative 2/19 influenza A/B negative  Antimicrobials: Anti-infectives (From admission, onward)   Start  Dose/Rate Route Frequency Ordered Stop   01/02/21 1745  cefTRIAXone (ROCEPHIN) 1 g in sodium chloride 0.9 % 100 mL IVPB        1 g 200 mL/hr over 30 Minutes Intravenous Every 24 hours 01/02/21 1657     01/02/21 1745  azithromycin (ZITHROMAX) 500 mg in sodium chloride 0.9 % 250 mL IVPB        500 mg 250 mL/hr over 60 Minutes Intravenous Every 24  hours 01/02/21 1657         Devices    LINES / TUBES:      Continuous Infusions: . azithromycin    . cefTRIAXone (ROCEPHIN)  IV    . sodium bicarbonate (isotonic) 150 mEq in D5W 1000 mL infusion 75 mL/hr at 01/02/21 1130     Objective: Vitals:   01/02/21 0900 01/02/21 1000 01/02/21 1200 01/02/21 1530  BP: (!) 143/68 (!) 105/37 124/63 115/65  Pulse: (!) 59 (!) 59 (!) 59 (!) 56  Resp:  '20 20 17  '$ Temp:    100 F (37.8 C)  TempSrc:    Oral  SpO2: 97% 97% 99% 100%  Weight:      Height:        Intake/Output Summary (Last 24 hours) at 01/02/2021 1702 Last data filed at 01/02/2021 1600 Gross per 24 hour  Intake 337.5 ml  Output --  Net 337.5 ml   Filed Weights   01/01/21 1718  Weight: 103.4 kg    Examination:  General: A/O x4, positive acute respiratory distress Eyes: negative scleral hemorrhage, negative anisocoria, negative icterus ENT: Negative Runny nose, negative gingival bleeding, Neck:  Negative scars, masses, torticollis, lymphadenopathy, JVD Lungs: decreased breath sounds bilaterally, positive moderate diffuse expiratory wheezes negative crackles, positive use of accessory muscles Cardiovascular: Regular rate and rhythm without murmur gallop or rub normal S1 and S2 Abdomen: OBESE, negative abdominal pain, nondistended, positive soft, bowel sounds, no rebound, no ascites, no appreciable mass Extremities: No significant cyanosis, clubbing, or edema bilateral lower extremities Skin: Negative rashes, lesions, ulcers Psychiatric:  Negative depression, negative anxiety, negative fatigue, negative mania  Central nervous system:  Cranial nerves II through XII intact, tongue/uvula midline, all extremities muscle strength 5/5, sensation intact throughout, negative dysarthria, negative expressive aphasia, negative receptive aphasia.  .     Data Reviewed: Care during the described time interval was provided by me .  I have reviewed this patient's available data,  including medical history, events of note, physical examination, and all test results as part of my evaluation.  CBC: Recent Labs  Lab 01/01/21 1230 01/01/21 2054 01/02/21 0607 01/02/21 1055  WBC 9.2  --  10.9*  --   HGB 12.2 11.6*  11.6* 12.4 11.2*  HCT 39.2 34.0*  34.0* 41.9 33.0*  MCV 97.0  --  98.8  --   PLT 271  --  264  --    Basic Metabolic Panel: Recent Labs  Lab 01/01/21 1230 01/01/21 2054 01/02/21 0607 01/02/21 1055  NA 139 140  140 140 141  K 5.5* 5.5*  5.5* 4.3 4.4  CL 108 114* 108  --   CO2 18*  --  17*  --   GLUCOSE 87 68* 125*  --   BUN 64* 80* 62*  --   CREATININE 2.93* 2.90* 2.58*  --   CALCIUM 8.4*  --  8.5*  --    GFR: Estimated Creatinine Clearance: 27.8 mL/min (A) (by C-G formula based on SCr of 2.58 mg/dL (H)). Liver Function Tests: Recent  Labs  Lab 01/01/21 1230 01/02/21 0607  AST 57* 87*  ALT 79* 103*  ALKPHOS 168* 160*  BILITOT 0.7 0.3  PROT 6.9 7.0  ALBUMIN 3.5 3.4*   No results for input(s): LIPASE, AMYLASE in the last 168 hours. Recent Labs  Lab 01/01/21 1350 01/02/21 0116  AMMONIA 37* 30   Coagulation Profile: No results for input(s): INR, PROTIME in the last 168 hours. Cardiac Enzymes: No results for input(s): CKTOTAL, CKMB, CKMBINDEX, TROPONINI in the last 168 hours. BNP (last 3 results) No results for input(s): PROBNP in the last 8760 hours. HbA1C: Recent Labs    01/02/21 0116  HGBA1C 8.2*   CBG: Recent Labs  Lab 01/02/21 0100 01/02/21 0321 01/02/21 0757 01/02/21 1202 01/02/21 1626  GLUCAP 182* 100* 116* 100* 169*   Lipid Profile: No results for input(s): CHOL, HDL, LDLCALC, TRIG, CHOLHDL, LDLDIRECT in the last 72 hours. Thyroid Function Tests: Recent Labs    01/01/21 1350  TSH 2.138   Anemia Panel: No results for input(s): VITAMINB12, FOLATE, FERRITIN, TIBC, IRON, RETICCTPCT in the last 72 hours. Sepsis Labs: No results for input(s): PROCALCITON, LATICACIDVEN in the last 168 hours.  Recent  Results (from the past 240 hour(s))  Resp Panel by RT-PCR (Flu A&B, Covid) Nasopharyngeal Swab     Status: None   Collection Time: 01/02/21  1:16 AM   Specimen: Nasopharyngeal Swab; Nasopharyngeal(NP) swabs in vial transport medium  Result Value Ref Range Status   SARS Coronavirus 2 by RT PCR NEGATIVE NEGATIVE Final    Comment: (NOTE) SARS-CoV-2 target nucleic acids are NOT DETECTED.  The SARS-CoV-2 RNA is generally detectable in upper respiratory specimens during the acute phase of infection. The lowest concentration of SARS-CoV-2 viral copies this assay can detect is 138 copies/mL. A negative result does not preclude SARS-Cov-2 infection and should not be used as the sole basis for treatment or other patient management decisions. A negative result may occur with  improper specimen collection/handling, submission of specimen other than nasopharyngeal swab, presence of viral mutation(s) within the areas targeted by this assay, and inadequate number of viral copies(<138 copies/mL). A negative result must be combined with clinical observations, patient history, and epidemiological information. The expected result is Negative.  Fact Sheet for Patients:  EntrepreneurPulse.com.au  Fact Sheet for Healthcare Providers:  IncredibleEmployment.be  This test is no t yet approved or cleared by the [place] FDA and  has been authorized for detection and/or diagnosis of SARS-CoV-2 by FDA under an Emergency Use Authorization (EUA). This EUA will remain  in effect (meaning this test can be used) for the duration of the COVID-19 declaration under Section 564(b)(1) of the Act, 21 U.S.C.section 360bbb-3(b)(1), unless the authorization is terminated  or revoked sooner.       Influenza A by PCR NEGATIVE NEGATIVE Final   Influenza B by PCR NEGATIVE NEGATIVE Final    Comment: (NOTE) The Xpert Xpress SARS-CoV-2/FLU/RSV plus assay is intended as an aid in the  diagnosis of influenza from Nasopharyngeal swab specimens and should not be used as a sole basis for treatment. Nasal washings and aspirates are unacceptable for Xpert Xpress SARS-CoV-2/FLU/RSV testing.  Fact Sheet for Patients: EntrepreneurPulse.com.au  Fact Sheet for Healthcare Providers: IncredibleEmployment.be  This test is not yet approved or cleared by the [place] FDA and has been authorized for detection and/or diagnosis of SARS-CoV-2 by FDA under an Emergency Use Authorization (EUA). This EUA will remain in effect (meaning this test can be used) for the duration of the  COVID-19 declaration under Section 564(b)(1) of the Act, 21 U.S.C. section 360bbb-3(b)(1), unless the authorization is terminated or revoked.  Performed at [facility], [place] [address], [place], [place] [address]          Radiology Studies: DG Chest 1 View  Result Date: 01/01/2021 CLINICAL DATA:  Altered level of consciousness, wheezing EXAM: CHEST  1 VIEW COMPARISON:  12/01/2019 FINDINGS: Single frontal view of the chest demonstrates a stable cardiac silhouette. No airspace disease, effusion, or pneumothorax. Chronic interstitial prominence consistent with known history of COPD. IMPRESSION: 1. No acute intrathoracic process. 2. Chronic interstitial scarring compatible with known history of COPD. Electronically Signed   By: Randa Ngo M.D.   On: 01/01/2021 23:44   DG Abd 1 View  Result Date: 01/02/2021 CLINICAL DATA:  Change in mental status EXAM: ABDOMEN - 1 VIEW COMPARISON:  None recent FINDINGS: Gas throughout the colon with some stool seen proximally. No obstructive pattern. No small bowel dilatation. No concerning mass effect or stone like calcification. Atheromatous calcifications are noted at the pelvis and left upper quadrant. IMPRESSION: Nonobstructive bowel gas pattern with moderate colonic gas and stool. Electronically Signed   By: Monte Fantasia  M.D.   On: 01/02/2021 05:30   CT Head Wo Contrast  Result Date: 01/01/2021 CLINICAL DATA:  Altered mental status. EXAM: CT HEAD WITHOUT CONTRAST TECHNIQUE: Contiguous axial images were obtained from the base of the skull through the vertex without intravenous contrast. COMPARISON:  January 01, 2021 (2:00 p.m.) FINDINGS: Brain: No evidence of acute infarction, hemorrhage, hydrocephalus, extra-axial collection or mass lesion/mass effect. 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 Vascular: No hyperdense vessel or unexpected calcification. Skull: Normal. Negative for fracture or focal lesion. Sinuses/Orbits: No acute finding. Other: None. IMPRESSION: 1. No acute intracranial abnormality. 2. 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 Electronically Signed   By: Virgina Norfolk M.D.   On: 01/01/2021 23:43   CT Head Wo Contrast  Result Date: 01/01/2021 CLINICAL DATA:  Psychotic symptoms for 3 days. EXAM: CT HEAD WITHOUT CONTRAST TECHNIQUE: Contiguous axial images were obtained from the base of the skull through the vertex without intravenous contrast. COMPARISON:  Head CT 09/29/2018. FINDINGS: Brain: No evidence of acute infarction, hemorrhage, hydrocephalus, extra-axial collection or mass lesion/mass effect. 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 Vascular: No hyperdense vessel or unexpected calcification. Skull: Intact.  No focal lesion. Sinuses/Orbits: Status post cataract surgery.  Otherwise negative. Other: None. IMPRESSION: No acute abnormality. 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 Electronically Signed   By: Inge Rise M.D.   On: 01/01/2021 14:12   MR BRAIN WO CONTRAST  Result Date: 01/02/2021 CLINICAL DATA:  Psychosis with mental status change. EXAM: MRI HEAD WITHOUT CONTRAST TECHNIQUE: Multiplanar, multiecho pulse sequences of the brain and surrounding structures were obtained without intravenous  contrast. COMPARISON:  Head CT from yesterday FINDINGS: Brain: Remote cortically based infarcts along the high right frontal parietal convexity, distal MCA to watershed territory. Chronic small vessel ischemia in the cerebral white matter and pons. White matter changes are greater on the right. Increased FLAIR signal within right-sided MCA branches. This is attributed to ICA findings described below. No acute hemorrhage, acute infarct, hydrocephalus, mass, or collection. Vascular: Loss of right carotid flow void in the neck on cavernous segment with reconstitution by the supraclinoid segment. Skull and upper cervical spine: Normal marrow signal Sinuses/Orbits: Bilateral cataract resection. Other: Significant motion degradation with truncated study. Axial  T1, gradient, and coronal T2 weighted images were not acquired. IMPRESSION: 1. No acute finding. 2. Slow or absent flow in the right cervical carotid with remote right cerebral infarcts. 3. Motion degraded study which was truncated. 4. Chronic small vessel disease. Electronically Signed   By: Monte Fantasia M.D.   On: 01/02/2021 07:37        Scheduled Meds: . allopurinol  100 mg Oral Daily  . amiodarone  100 mg Oral Daily  . amLODipine  10 mg Oral Daily  . apixaban  5 mg Oral BID  . atorvastatin  40 mg Oral QPM  . bisoprolol  5 mg Oral Daily  . clopidogrel  75 mg Oral Daily  . folic acid  1 mg Oral Daily  . insulin aspart  0-9 Units Subcutaneous TID WC  . insulin glargine  40 Units Subcutaneous QHS  . ipratropium-albuterol  3 mL Nebulization QID  . levothyroxine  50 mcg Oral Daily  . losartan  100 mg Oral Daily  . methylPREDNISolone (SOLU-MEDROL) injection  60 mg Intravenous Q12H  . mometasone-formoterol  2 puff Inhalation BID  . nicotine  21 mg Transdermal Daily  . pantoprazole  40 mg Oral Daily  . spironolactone  25 mg Oral Daily  . torsemide  20 mg Oral BID  . umeclidinium bromide  1 puff Inhalation Daily   Continuous Infusions: .  azithromycin    . cefTRIAXone (ROCEPHIN)  IV    . sodium bicarbonate (isotonic) 150 mEq in D5W 1000 mL infusion 75 mL/hr at 01/02/21 1130     LOS: 0 days    Time spent:40 min    Nickalas Mccarrick, Geraldo Docker, MD [facility]   If 7PM-7AM, please contact night-coverage 01/02/2021, 5:02 PM

## 2021-01-02 NOTE — ED Notes (Signed)
Pt transported to MRI 

## 2021-01-02 NOTE — H&P (Addendum)
History and Physical    [name] [id number] DOB: 1961/05/31 DOA: 01/01/2021   PCP: Loretha Brasil, FNP (Inactive)  Patient coming from: home  Chief Complaint: hallucination  HPI: [name] is a 60 y.o. female with medical history significant of polysubstance abuse, coronary artery disease status post catheterization, peripheral vascular disease, history of cellulitis and osteomyelitis, CKD 3, COPD, type 2 diabetes, depression, paroxysmal atrial fibrillation anticoagulated on Eliquis  HPI/review of systems unable to be obtained on initial interview with patient.  ED Course: Initially presented complaining of hallucinations.  Ativan was provided prior to taking patient down for MRI, on arrival to MRI patient was quite somnolent, MRI had to be canceled.  Patient was arousable but not conversant, not following commands.  Presumed Ativan adverse effect, repeat labs showed no obvious other metabolic derangement, showed stable COPD no acute change, kept for observation.  UDS pending  Review of Systems: Unable to obtain due to patient condition  Past Medical History:  Diagnosis Date  . Alcohol abuse   . Alcoholic cirrhosis ([place])   . Anemia   . Anxiety   . Arthritis    "knees" (11/26/2018)  . B12 deficiency   . CAD (coronary artery disease)    a. 11/10/2014 Cath: LM nl, LAD min irregs, D1 30 ost, D2 50d, LCX 80m OM1 80 p/m (1.5 mm vessel), OM2 439mRCA nondom 9045mmed rx.. Demand ischemia in the setting of rapid a-fib.  . Cardiomyopathy ([place] . Carotid artery disease ([place]  a. 01/2015 Carotid Angio: RICA [id number]. [id number] L CEA; c. 05/2019 Carotid U/S: RICA 100. RECA >50. LICA [id number]. Cellulitis    lower extremities  . Chronic combined systolic and diastolic CHF (congestive heart failure) (HC[place]  a. 10/2014 Echo: EF 40-45%; b. 10/2018 Echo: EF 45-50%, gr2 DD; c. 11/2019 Echo: EF 50%, mild LVH, gr2 DD (restrictive), antlat HK, Nl RV fxn. Mild BAE. RVSP 59.9mm74m   . CKD (chronic kidney disease), stage III ([name])[place]. Cocaine abuse ([name])[place]. COPD (chronic obstructive pulmonary disease) ([name])[place]. Critical lower limb ischemia ([name])[place]. Depression   . Diabetic peripheral neuropathy ([name])[place]. Dyspnea   . Elevated troponin    a. Chronic elevation.  . GEMarland KitchenD (gastroesophageal reflux disease)   . Hyperlipemia   . Hypertension   . Hypokalemia   . Hypomagnesemia   . Hypothyroidism   . Marijuana abuse   . Narcotic abuse ([name])[place]. Noncompliance   . NSVT (nonsustained ventricular tachycardia) ([name])[place]. Obesity   . PAF (paroxysmal atrial fibrillation) ([name])[place]. Paroxysmal atrial tachycardia ([name])[place]. Peripheral arterial disease ([name])[place] a. 01/2015 Angio/PTA: RSFA 99 (atherectomy/pta) - 1 vessel runoff via diff dzs peroneal; b. 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  . Pneumonia    "once or twice" (11/26/2018)  . Poorly controlled type 2 diabetes mellitus ([name])[place]. Renal insufficiency    a. 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  . SlMarland Kitchenep apnea    "couldn't handle wearing the mask" (11/26/2018)  . Symptomatic bradycardia    a. Avoid AV blocking agent per EP. Prev req temp wire in 2017.  . Tobacco abuse     Past Surgical History:  Procedure Laterality Date  . ABDOMINAL AORTOGRAM N/A 06/26/2019   Procedure: ABDOMINAL AORTOGRAM;  Surgeon: AridKathlyn Sacramento  A, MD;  Location: [place] [facility];  Service: Cardiovascular;  Laterality: N/A;  . AMPUTATION Right 06/14/2017   Procedure: Right foot transmetatarsal amputation;  Surgeon: Newt Minion, MD;  Location: [place];  Service: Orthopedics;  Laterality: Right;  . AMPUTATION Left 06/28/2019   Procedure: AMPUTATION LEFT FIFTH TOE;  Surgeon: Rosetta Posner, MD;  Location: [place];  Service: Vascular;  Laterality: Left;  . AMPUTATION TOE Right 04/28/2017   Procedure: AMPUTATION OF RIGHT SECOND RAY;  Surgeon: Newt Minion, MD;  Location: [place];  Service: Orthopedics;  Laterality: Right;  . CARDIAC CATHETERIZATION     . CARDIAC CATHETERIZATION N/A 01/12/2016   Procedure: Temporary Wire;  Surgeon: Minus Breeding, MD;  Location: [place] [facility];  Service: Cardiovascular;  Laterality: N/A;  . CARDIOVERSION  ~ 02/2013   "twice"   . CAROTID ANGIOGRAM N/A 01/15/2015   Procedure: CAROTID ANGIOGRAM;  Surgeon: [name], MD;  Location: [facility] C[facility] LAB;  Service: Cardiovascular;  Laterality: N/A;  . DILATION AND CURETTAGE OF UTERUS  1988  . ENDARTERECTOMY Left 02/19/2015   Procedure: LEFT CAROTID ENDARTERECTOMY ;  Surgeon: Serafina Mitchell, MD;  Location: [place];  Service: Vascular;  Laterality: Left;  . FEMORAL-TIBIAL BYPASS GRAFT Left 06/28/2019   Procedure: BYPASS GRAFT LEFT LEG FEMORAL TO ANTERIOR TIBIAL ARTERY using LEFT GREATER SAPHENOUS VEIN;  Surgeon: Rosetta Posner, MD;  Location: [place];  Service: Vascular;  Laterality: Left;  . LEFT HEART CATHETERIZATION WITH CORONARY ANGIOGRAM N/A 10/31/2014   Procedure: LEFT HEART CATHETERIZATION WITH CORONARY ANGIOGRAM;  Surgeon: Burnell Blanks, MD;  Location[facility] C[facility] LAB;  Service: Cardiovascular;  Laterality: N/A;  . LOWER EXTREMITY ANGIOGRAM N/A 09/10/2013   Procedure: LOWER EXTREMITY ANGIOGRAM;  Surgeon: [name], MD;  Location: [facility] C[facility] LAB;  Service: Cardiovascular;  Laterality: N/A;  . LOWER EXTREMITY ANGIOGRAM N/A 01/15/2015   Procedure: LOWER EXTREMITY ANGIOGRAM;  Surgeon: [name], MD;  Location: [facility] CATH LAB;  Service: Cardiovascular;  Laterality: N/A;  . LOWER EXTREMITY ANGIOGRAPHY N/A 04/13/2017   Procedure: Lower Extremity Angiography;  Surgeon: [name], MD;  Location: [place] [facility];  Service: Cardiovascular;  Laterality: N/A;  . LOWER EXTREMITY ANGIOGRAPHY Left 06/26/2019   Procedure: LOWER EXTREMITY ANGIOGRAPHY;  Surgeon: Wellington Hampshire, MD;  Location: [place] [facility] LAB;  Service: Cardiovascular;  Laterality: Left;  . PERIPHERAL VASCULAR BALLOON ANGIOPLASTY Left 06/26/2019   Procedure: PERIPHERAL VASCULAR BALLOON  ANGIOPLASTY;  Surgeon: Wellington Hampshire, MD;  Location: [place] CV LAB;  Service: Cardiovascular;  Laterality: Left;  unable to cross lt sfa occlusion  . PERIPHERAL VASCULAR INTERVENTION  04/13/2017   Procedure: Peripheral Vascular Intervention;  Surgeon: [name], MD;  Location: [place] [facility];  Service: Cardiovascular;;  . PRESSURE SENSOR/CARDIOMEMS N/A 02/05/2020   Procedure: PRESSURE SENSOR/CARDIOMEMS;  Surgeon: Larey Dresser, MD;  Location: [place] [facility];  Service: Cardiovascular;  Laterality: N/A;  . VEIN HARVEST Left 06/28/2019   Procedure: LEFT LEG GREATER SAPHENOUS VEIN HARVEST;  Surgeon: Rosetta Posner, MD;  Location: [place];  Service: Vascular;  Laterality: Left;     reports that she has been smoking e-cigarettes and cigarettes. She has a 44.00 pack-year smoking history. She has quit using smokeless tobacco.  Her smokeless tobacco use included snuff. She reports current alcohol use. She reports current drug use. Drugs: "Crack" cocaine, Marijuana, and Oxycodone.  Allergies  Allergen Reactions  . Gabapentin Nausea And Vomiting and Other (See  Comments)    POSSIBLE SHAKING? TAKING MED CURRENTLY  . Lyrica [Pregabalin] Other (See Comments)    Shaking Pt still taking lyrica--"probably will stop taking"     Family History  Problem Relation Age of Onset  . Hypertension Mother   . Diabetes Mother   . Cancer Mother        breast, ovarian, colon  . Clotting disorder Mother   . Heart disease Mother   . Heart attack Mother   . Breast cancer Mother        in 35's  . Hypertension Father   . Heart disease Father   . Emphysema Sister        smoked   Unacceptable: Noncontributory, unremarkable, or negative. Acceptable: Family history reviewed and not pertinent (If you reviewed it)  Prior to Admission medications   Medication Sig Start Date End Date Taking? Authorizing Provider  albuterol (VENTOLIN HFA) 108 (90 Base) MCG/ACT inhaler Inhale 2 puffs into the lungs  every 6 (six) hours as needed for wheezing or shortness of breath. 07/08/19  Yes Oretha Milch D, MD  allopurinol (ZYLOPRIM) 100 MG tablet Take 100 mg by mouth daily. 11/25/20  Yes [provider]  amiodarone (PACERONE) 200 MG tablet Take 0.5 tablets (100 mg total) by mouth daily. 10/15/20  Yes Larey Dresser, MD  amLODipine (NORVASC) 10 MG tablet Take 1 tablet (10 mg total) by mouth daily. 10/15/20  Yes Larey Dresser, MD  apixaban (ELIQUIS) 5 MG TABS tablet TAKE 1 TABLET(5 MG) BY MOUTH TWICE DAILY Patient taking differently: Take 5 mg by mouth 2 (two) times daily. 10/15/20  Yes Larey Dresser, MD  atorvastatin (LIPITOR) 40 MG tablet Take 40 mg by mouth every evening.   Yes [provider]  bisoprolol (ZEBETA) 5 MG tablet Take 1 tablet (5 mg total) by mouth daily. 10/15/20  Yes Larey Dresser, MD  budesonide-formoterol [facility]) 160-4.5 MCG/ACT inhaler Inhale 2 puffs into the lungs 2 (two) times daily. 12/09/19  Yes Eugenie Filler, MD  cholecalciferol (VITAMIN D) 1000 units tablet Take 1 tablet (1,000 Units total) by mouth daily. 07/08/19  Yes Oretha Milch D, MD  clopidogrel (PLAVIX) 75 MG tablet Take 1 tablet (75 mg total) by mouth daily. 10/15/20  Yes Larey Dresser, MD  colchicine 0.6 MG tablet Take 0.6 mg by mouth daily as needed (gout attacks). 11/25/20  Yes [provider]  empagliflozin (JARDIANCE) 25 MG TABS tablet Take 25 mg by mouth daily.   Yes [provider]  folic acid (FOLVITE) 1 MG tablet Take 1 tablet (1 mg total) by mouth daily. 07/08/19  Yes Oretha Milch D, MD  Insulin Glargine (LANTUS SOLOSTAR) 100 UNIT/ML Solostar Pen Inject 40 Units into the skin at bedtime. Patient taking differently: Inject 39 Units into the skin at bedtime. 12/09/19  Yes Eugenie Filler, MD  Insulin Lispro (HUMALOG KWIKPEN) 200 UNIT/ML SOPN Inject 8 Units into the skin 3 (three) times daily with meals. Patient taking differently: Inject 15-20 Units into the  skin See admin instructions. Inject 15-20 units subcutaneously three times daily after meals if CBG is >100 07/08/19  Yes Oretha Milch D, MD  levothyroxine (SYNTHROID, LEVOTHROID) 50 MCG tablet Take 1 tablet (50 mcg total) by mouth daily. 08/09/17  Yes Medina-Vargas, Monina C, NP  losartan (COZAAR) 100 MG tablet Take 1 tablet (100 mg total) by mouth daily. 10/15/20  Yes Larey Dresser, MD  Omega-3 1000 MG CAPS Take 1,000 mg by mouth 2 (two)  times daily.   Yes [provider]  pantoprazole (PROTONIX) 40 MG tablet Take 1 tablet (40 mg total) by mouth daily. 02/21/20  Yes Larey Dresser, MD  spironolactone (ALDACTONE) 25 MG tablet Take 1 tablet (25 mg total) by mouth daily. Patient taking differently: Take 25 mg by mouth at bedtime. 10/15/20  Yes Larey Dresser, MD  tiotropium (SPIRIVA HANDIHALER) 18 MCG inhalation capsule Place 1 capsule (18 mcg total) into inhaler and inhale every morning. 12/09/19  Yes Eugenie Filler, MD  torsemide (DEMADEX) 20 MG tablet Take 3 tablets (60 mg total) by mouth every morning AND 2 tablets (40 mg total) every evening. 10/15/20  Yes Larey Dresser, MD  ACCU-CHEK GUIDE test strip USE TO CHECK BLOOD SUGAR FOUR TIMES DAILY 02/25/20   [provider]  empagliflozin (JARDIANCE) 10 MG TABS tablet TAKE 1 TABLET BY MOUTH DAILY BEFORE BREAKFAST Patient not taking: No sig reported 10/30/20   Larey Dresser, MD  nortriptyline (PAMELOR) 50 MG capsule Take 50 mg by mouth at bedtime. 11/28/19   [provider]    Physical Exam: Vitals:   01/01/21 1914 01/01/21 2044 01/01/21 2146 01/01/21 2151  BP: 120/69 120/72  133/62  Pulse: 62 63 (!) 56 (!) 56  Resp: '19 19 19 19  '$ Temp: 98.6 F (37 C)   98.6 F (37 C)  TempSrc: Oral   Oral  SpO2: 96% 96% 92% 92%  Weight:      Height:          Constitutional: NAD, calm, comfortable Vitals:   01/01/21 1914 01/01/21 2044 01/01/21 2146 01/01/21 2151  BP: 120/69 120/72  133/62  Pulse: 62 63 (!) 56 (!)  56  Resp: '19 19 19 19  '$ Temp: 98.6 F (37 C)   98.6 F (37 C)  TempSrc: Oral   Oral  SpO2: 96% 96% 92% 92%  Weight:      Height:       Eyes: PERRL, lids and conjunctivae normal ENMT: Mucous membranes are moist.  Neck: normal, supple, no masses, no thyromegaly Respiratory: Diffuse rhonchi bilaterally, patient is snoring Cardiovascular: Regular rate and rhythm, no murmurs / rubs / gallops.  Trace bilateral extremity edema. 2+ pedal pulses. No carotid bruits.  Abdomen: no tenderness, no masses palpated. No hepatosplenomegaly. Bowel sounds positive.  Musculoskeletal: no clubbing / cyanosis.  Neurologic: Patient not following commands, good/normal muscle tone, no facial droop, patient is arousable, PERRL, has gag reflex Psychiatric: Unable to assess   Labs on Admission: I have personally reviewed following labs and imaging studies  CBC: Recent Labs  Lab 01/01/21 1230 01/01/21 2054  WBC 9.2  --   HGB 12.2 11.6*  11.6*  HCT 39.2 34.0*  34.0*  MCV 97.0  --   PLT 271  --    Basic Metabolic Panel: Recent Labs  Lab 01/01/21 1230 01/01/21 2054  NA 139 140  140  K 5.5* 5.5*  5.5*  CL 108 114*  CO2 18*  --   GLUCOSE 87 68*  BUN 64* 80*  CREATININE 2.93* 2.90*  CALCIUM 8.4*  --    GFR: Estimated Creatinine Clearance: 24.8 mL/min (A) (by C-G formula based on SCr of 2.9 mg/dL (H)). Liver Function Tests: Recent Labs  Lab 01/01/21 1230  AST 57*  ALT 79*  ALKPHOS 168*  BILITOT 0.7  PROT 6.9  ALBUMIN 3.5   No results for input(s): LIPASE, AMYLASE in the last 168 hours. Recent Labs  Lab 01/01/21 1350 01/02/21 0116  AMMONIA 37* 30   Coagulation Profile: No results for input(s): INR, PROTIME in the last 168 hours. Cardiac Enzymes: No results for input(s): CKTOTAL, CKMB, CKMBINDEX, TROPONINI in the last 168 hours. BNP (last 3 results) No results for input(s): PROBNP in the last 8760 hours. HbA1C: Recent Labs    01/02/21 0116  HGBA1C 8.2*   CBG: Recent Labs   Lab 01/01/21 2029 01/01/21 2154 01/02/21 0100  GLUCAP 71 65* 182*   Lipid Profile: No results for input(s): CHOL, HDL, LDLCALC, TRIG, CHOLHDL, LDLDIRECT in the last 72 hours. Thyroid Function Tests: Recent Labs    01/01/21 1350  TSH 2.138   Anemia Panel: No results for input(s): VITAMINB12, FOLATE, FERRITIN, TIBC, IRON, RETICCTPCT in the last 72 hours. Urine analysis:    Component Value Date/Time   COLORURINE YELLOW 12/02/2019 0041   APPEARANCEUR CLEAR 12/02/2019 0041   LABSPEC 1.013 12/02/2019 0041   PHURINE 5.0 12/02/2019 0041   GLUCOSEU NEGATIVE 12/02/2019 0041   HGBUR SMALL (A) 12/02/2019 0041   BILIRUBINUR NEGATIVE 12/02/2019 0041   KETONESUR NEGATIVE 12/02/2019 0041   PROTEINUR 100 (A) 12/02/2019 0041   UROBILINOGEN 0.2 09/05/2015 0012   NITRITE NEGATIVE 12/02/2019 0041   LEUKOCYTESUR SMALL (A) 12/02/2019 0041   Sepsis Labs: '@LABRCNTIP'$ (procalcitonin:4,lacticidven:4) )No results found for this or any previous visit (from the past 240 hour(s)).   Radiological Exams on Admission: DG Chest 1 View  Result Date: 01/01/2021 CLINICAL DATA:  Altered level of consciousness, wheezing EXAM: CHEST  1 VIEW COMPARISON:  12/01/2019 FINDINGS: Single frontal view of the chest demonstrates a stable cardiac silhouette. No airspace disease, effusion, or pneumothorax. Chronic interstitial prominence consistent with known history of COPD. IMPRESSION: 1. No acute intrathoracic process. 2. Chronic interstitial scarring compatible with known history of COPD. Electronically Signed   By: Randa Ngo M.D.   On: 01/01/2021 23:44   CT Head Wo Contrast  Result Date: 01/01/2021 CLINICAL DATA:  Altered mental status. EXAM: CT HEAD WITHOUT CONTRAST TECHNIQUE: Contiguous axial images were obtained from the base of the skull through the vertex without intravenous contrast. COMPARISON:  January 01, 2021 (2:00 p.m.) FINDINGS: Brain: No evidence of acute infarction, hemorrhage, hydrocephalus,  extra-axial collection or mass lesion/mass effect. 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 Vascular: No hyperdense vessel or unexpected calcification. Skull: Normal. Negative for fracture or focal lesion. Sinuses/Orbits: No acute finding. Other: None. IMPRESSION: 1. No acute intracranial abnormality. 2. 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 Electronically Signed   By: Virgina Norfolk M.D.   On: 01/01/2021 23:43   CT Head Wo Contrast  Result Date: 01/01/2021 CLINICAL DATA:  Psychotic symptoms for 3 days. EXAM: CT HEAD WITHOUT CONTRAST TECHNIQUE: Contiguous axial images were obtained from the base of the skull through the vertex without intravenous contrast. COMPARISON:  Head CT 09/29/2018. FINDINGS: Brain: No evidence of acute infarction, hemorrhage, hydrocephalus, extra-axial collection or mass lesion/mass effect. 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 Vascular: No hyperdense vessel or unexpected calcification. Skull: Intact.  No focal lesion. Sinuses/Orbits: Status post cataract surgery.  Otherwise negative. Other: None. IMPRESSION: No acute abnormality. 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 Electronically Signed   By: Inge Rise M.D.   On: 01/01/2021 14:12    EKG: Independently reviewed  Assessment/Plan Active Problems:   DM (diabetes mellitus), type 2 with peripheral vascular complications ([name])   History of cocaine abuse ([place])   Hypothyroidism   S/P peripheral artery angioplasty - [place] atherectomy;  R SFA   Claudication ([name])   PAF (paroxysmal atrial fibrillation) ([name])   Chronic combined systolic and diastolic CHF (congestive heart failure) ([name])   COPD GOLD 0    CKD (chronic kidney disease), stage III ([name])   AKI (acute kidney injury) ([name])   PAD (peripheral artery disease) ([name])   Altered mental status  I checked on patient again just prior to leaving for the night.  She  is in the hallway, in view of nurses station.  She is arousable, can say "hello" to me but cannot answer questions, just falls back asleep essentially.  Small improvement from earlier exam this evening.   CARDIOVASCULAR Stable coronary artery disease/peripheral vascular disease,  --> continue home anticoagulation and other home medication  RESPIRATORY --> Pulse ox with vital signs  NEUROLOGICAL/PSYCHIATRIC Altered mental status likely due to Ativan toxicity --> UDS looks like was ordered at time of admission, patient has history of polysubstance use/abuse --> Low threshold for psychiatric admission/consultation  RENAL AKI on CKD, probably contributed to likely Ativan toxicity.  Hyperkalemia, mild --> Repeat a.m. labs --> Hyperkalemia looks like did not correct with albuterol administration on i-STAT, will trial insulin/glucose  ENDOCRINE DM2 Stable, SSI if needed, A1C 8       DVT prophylaxis: home eliquis  Code Status: FULL Family Communication: unable to reach Disposition Plan: home / psych? Consults called: none Admission status: [place] [facility]   If 7PM-7AM, please contact night-coverage [email or web address]   01/02/2021, 2:16 AM

## 2021-01-03 DIAGNOSIS — R400 Somnolence: Secondary | ICD-10-CM | POA: Diagnosis not present

## 2021-01-03 DIAGNOSIS — F1411 Cocaine abuse, in remission: Secondary | ICD-10-CM

## 2021-01-03 DIAGNOSIS — I480 Paroxysmal atrial fibrillation: Secondary | ICD-10-CM | POA: Diagnosis not present

## 2021-01-03 DIAGNOSIS — R441 Visual hallucinations: Secondary | ICD-10-CM

## 2021-01-03 DIAGNOSIS — E039 Hypothyroidism, unspecified: Secondary | ICD-10-CM

## 2021-01-03 DIAGNOSIS — N179 Acute kidney failure, unspecified: Secondary | ICD-10-CM | POA: Diagnosis not present

## 2021-01-03 DIAGNOSIS — N1832 Chronic kidney disease, stage 3b: Secondary | ICD-10-CM

## 2021-01-03 LAB — COMPREHENSIVE METABOLIC PANEL
ALT: 93 U/L — ABNORMAL HIGH (ref 0–44)
AST: 59 U/L — ABNORMAL HIGH (ref 15–41)
Albumin: 3.5 g/dL (ref 3.5–5.0)
Alkaline Phosphatase: 161 U/L — ABNORMAL HIGH (ref 38–126)
Anion gap: 14 (ref 5–15)
BUN: 59 mg/dL — ABNORMAL HIGH (ref 6–20)
CO2: 21 mmol/L — ABNORMAL LOW (ref 22–32)
Calcium: 9.1 mg/dL (ref 8.9–10.3)
Chloride: 101 mmol/L (ref 98–111)
Creatinine, Ser: 2.05 mg/dL — ABNORMAL HIGH (ref 0.44–1.00)
GFR, Estimated: 28 mL/min — ABNORMAL LOW (ref >60)
Glucose, Bld: 292 mg/dL — ABNORMAL HIGH (ref 70–99)
Potassium: 4.4 mmol/L (ref 3.5–5.1)
Sodium: 136 mmol/L (ref 135–145)
Total Bilirubin: <0.1 mg/dL — ABNORMAL LOW (ref 0.3–1.2)
Total Protein: 7.1 g/dL (ref 6.5–8.1)

## 2021-01-03 LAB — CBC WITH DIFFERENTIAL/PLATELET
Abs Immature Granulocytes: 0.06 10*3/uL (ref 0.00–0.07)
Basophils Absolute: 0 10*3/uL (ref 0.0–0.1)
Basophils Relative: 0 %
Eosinophils Absolute: 0 10*3/uL (ref 0.0–0.5)
Eosinophils Relative: 0 %
HCT: 40.7 % (ref 36.0–46.0)
Hemoglobin: 12.3 g/dL (ref 12.0–15.0)
Immature Granulocytes: 1 %
Lymphocytes Relative: 8 %
Lymphs Abs: 0.7 10*3/uL (ref 0.7–4.0)
MCH: 29.1 pg (ref 26.0–34.0)
MCHC: 30.2 g/dL (ref 30.0–36.0)
MCV: 96.2 fL (ref 80.0–100.0)
Monocytes Absolute: 0.3 10*3/uL (ref 0.1–1.0)
Monocytes Relative: 3 %
Neutro Abs: 8.7 10*3/uL — ABNORMAL HIGH (ref 1.7–7.7)
Neutrophils Relative %: 88 %
Platelets: 291 10*3/uL (ref 150–400)
RBC: 4.23 MIL/uL (ref 3.87–5.11)
RDW: 14.8 % (ref 11.5–15.5)
WBC: 9.9 10*3/uL (ref 4.0–10.5)
nRBC: 0 % (ref 0.0–0.2)

## 2021-01-03 LAB — GLUCOSE, CAPILLARY
Glucose-Capillary: 177 mg/dL — ABNORMAL HIGH (ref 70–99)
Glucose-Capillary: 263 mg/dL — ABNORMAL HIGH (ref 70–99)
Glucose-Capillary: 392 mg/dL — ABNORMAL HIGH (ref 70–99)
Glucose-Capillary: 351 mg/dL — ABNORMAL HIGH (ref 70–99)

## 2021-01-03 LAB — MAGNESIUM: Magnesium: 2.3 mg/dL (ref 1.7–2.4)

## 2021-01-03 LAB — PHOSPHORUS: Phosphorus: 4 mg/dL (ref 2.5–4.6)

## 2021-01-03 MED ORDER — INSULIN ASPART 100 UNIT/ML ~~LOC~~ SOLN
8.0000 [IU] | Freq: Three times a day (TID) | SUBCUTANEOUS | Status: DC
  Administered 2021-01-03 – 2021-01-05 (×6): 8 [IU] via SUBCUTANEOUS

## 2021-01-03 MED ORDER — SODIUM CHLORIDE 0.9 % IV SOLN: INTRAVENOUS | Status: DC

## 2021-01-03 NOTE — Progress Notes (Signed)
Dr Sherral Hammers here.  Contact precautions discontinued per MD

## 2021-01-03 NOTE — Care Management Obs Status (Signed)
[place] NOTIFICATION   Patient Details  Name: [name] MRN: [id number] Date of Birth: 01-Nov-1961   Medicare Observation Status Notification Given:  Yes    Tiajuana Amass [place], [place] 01/03/2021, 9:33 AM

## 2021-01-03 NOTE — Progress Notes (Signed)
PROGRESS NOTE    [name]  [id number] DOB: 03-07-1961 DOA: 01/01/2021 PCP: Loretha Brasil, FNP (Inactive)     Brief Narrative:  60 y.o. WF PMHx Depression, Anxiety,, Polysubstance abuse (EtOH abuse, cocaine abuse, narcotic abuse, marijuana use),  type II uncontrolled with peripheral neuropathy, Paroxysmal atrial fibrillation anticoagulated on Eliquis  HPI/review of systems unable to be obtained on initial interview with patient.  ED Course: Initially presented complaining of hallucinations.  Ativan was provided prior to taking patient down for MRI, on arrival to MRI patient was quite somnolent, MRI had to be canceled.  Patient was arousable but not conversant, not following commands.  Presumed Ativan adverse effect, repeat labs showed no obvious other metabolic derangement, showed stable COPD no acute change, kept for observation.  UDS pending   Subjective: 2/25 overnight (elevated temp 37.8 C), A/O x4, sitting in chair WOB appears much more comfortable    Assessment & Plan: Covid vaccination; vaccinated 3/3   Active Problems:   DM (diabetes mellitus), type 2 with peripheral vascular complications ([place])   History of cocaine abuse ([place])   Hypothyroidism   S/P peripheral artery angioplasty - TurboHawk atherectomy; R SFA   Claudication ([name])   PAF (paroxysmal atrial fibrillation) ([name])   COPD GOLD 0    CKD (chronic kidney disease), stage III ([name])   AKI (acute kidney injury) ([place])   PAD (peripheral artery disease) ([name])   Altered mental status   AF (paroxysmal atrial fibrillation) ([name])   CAD (coronary artery disease)   PVD (peripheral vascular disease) ([name])   Acute renal failure superimposed on stage 4 chronic kidney disease ([place])   Diabetes mellitus type 2, uncontrolled, with complications ([name])   Diabetic nephropathy ([name])  Paroxysmal atrial fibrillation -Amiodarone 1 mg daily -Norvasc 10 mg daily -Eliquis 5 mg BID -Bisoprolol 5 mg daily -Spironolactone  25 mg daily (hold) worsening renal failure -Torsemide 20 mg BID (hold) worsening renal failure  CAD/PVD -See atrial fibrillation  Acute respiratory failure with hypoxia/COPD exacerbation -Solu-Medrol 60 mg bid -DuoNeb QID -Incentive spirometry -Flutter valve -Continuous pulse ox  Tobacco abuse -Counseled at length concerning the fact that she has significant COPD and should never smoke again. -Nicotine patch  Acute mental status change -Most likely iatrogenic secondary to Ativan use for MRI -MRI brain nondiagnostic see results below -2/19 UDS negative  Acute on CKD stage IV (baseline Cr 2.05) Lab Results  Component Value Date   CREATININE 2.05 (H) 01/03/2021   CREATININE 2.58 (H) 01/02/2021   CREATININE 2.90 (H) 01/01/2021   CREATININE 2.93 (H) 01/01/2021   CREATININE 2.05 (H) 09/10/2020  -See A. Fib -2/20 DC sodium bicarb. -2/20 normal saline 79m/hr -Strict in and out -Daily weight -2/20 patient states has appointment with nephrology on 2/24  DM type II uncontrolled with complication/DM nephropathy -2/19 hemoglobin A1c= 8.2 -Lantus 40 units daily -2/20 NovoLog 8 units qac -Sensitive SSI  Goals of care -2/20 PT/OT consult; patient with significant COPD exacerbation evaluate for SNF vs H/H    DVT prophylaxis: Eliquis Code Status: Full Family Communication:  Status is: Inpatient    Dispo: The patient is from: Home              Anticipated d/c is to: Home              Anticipated d/c date is: 2/21              Patient currently unstable      Consultants:    Procedures/Significant Events:  I have personally reviewed and interpreted all radiology studies and my findings are as above.  VENTILATOR SETTINGS: Room air 2/20 SPO2 98%   Cultures 2/19 SARS coronavirus negative 2/19 influenza A/B negative   Antimicrobials: Anti-infectives (From admission, onward)   Start     Ordered Stop   01/02/21 1845  azithromycin (ZITHROMAX) 500 mg in  sodium chloride 0.9 % 250 mL IVPB        01/02/21 1657     01/02/21 1800  cefTRIAXone (ROCEPHIN) 1 g in sodium chloride 0.9 % 100 mL IVPB        01/02/21 1657         Devices    LINES / TUBES:      Continuous Infusions: . azithromycin 500 mg (01/02/21 2202)  . cefTRIAXone (ROCEPHIN)  IV 1 g (01/02/21 1857)  . sodium bicarbonate (isotonic) 150 mEq in D5W 1000 mL infusion 75 mL/hr at 01/02/21 1130     Objective: Vitals:   01/03/21 0719 01/03/21 0725 01/03/21 0918 01/03/21 0934  BP: (!) 148/66 (!) 148/66  (!) 148/86  Pulse: (!) 49 (!) 55 67   Resp: '18 18 17   '$ Temp: 98.7 F (37.1 C) 98.7 F (37.1 C)    TempSrc: Oral Oral    SpO2: 97% 98% 98%   Weight:      Height:        Intake/Output Summary (Last 24 hours) at 01/03/2021 1155 Last data filed at 01/03/2021 0300 Gross per 24 hour  Intake 1670 ml  Output 1400 ml  Net 270 ml   Filed Weights   01/01/21 1718 01/03/21 0500  Weight: 103.4 kg 103.5 kg   Physical Exam:  General: A/O x4, No acute respiratory distress Eyes: negative scleral hemorrhage, negative anisocoria, negative icterus ENT: Negative Runny nose, negative gingival bleeding, Neck:  Negative scars, masses, torticollis, lymphadenopathy, JVD Lungs: decreased breath sounds bilaterally (improved from 2/19) without wheezes or crackles Cardiovascular: Regular rate and rhythm without murmur gallop or rub normal S1 and S2 Abdomen: OBESE, negative abdominal pain, nondistended, positive soft, bowel sounds, no rebound, no ascites, no appreciable mass Extremities: No significant cyanosis, clubbing, or edema bilateral lower extremities Skin: Negative rashes, lesions, ulcers Psychiatric:  Negative depression, negative anxiety, negative fatigue, negative mania  Central nervous system:  Cranial nerves II through XII intact, tongue/uvula midline, all extremities muscle strength 5/5, sensation intact throughout, negative dysarthria, negative expressive aphasia, negative  receptive aphasia.  .     Data Reviewed: Care during the described time interval was provided by me .  I have reviewed this patient's available data, including medical history, events of note, physical examination, and all test results as part of my evaluation.  CBC: Recent Labs  Lab 01/01/21 1230 01/01/21 2054 01/02/21 0607 01/02/21 1055  WBC 9.2  --  10.9*  --   HGB 12.2 11.6*  11.6* 12.4 11.2*  HCT 39.2 34.0*  34.0* 41.9 33.0*  MCV 97.0  --  98.8  --   PLT 271  --  264  --    Basic Metabolic Panel: Recent Labs  Lab 01/01/21 1230 01/01/21 2054 01/02/21 0607 01/02/21 1055  NA 139 140  140 140 141  K 5.5* 5.5*  5.5* 4.3 4.4  CL 108 114* 108  --   CO2 18*  --  17*  --   GLUCOSE 87 68* 125*  --   BUN 64* 80* 62*  --   CREATININE 2.93* 2.90* 2.58*  --   CALCIUM 8.4*  --  8.5*  --    GFR: Estimated Creatinine Clearance: 27.8 mL/min (A) (by C-G formula based on SCr of 2.58 mg/dL (H)). Liver Function Tests: Recent Labs  Lab 01/01/21 1230 01/02/21 0607  AST 57* 87*  ALT 79* 103*  ALKPHOS 168* 160*  BILITOT 0.7 0.3  PROT 6.9 7.0  ALBUMIN 3.5 3.4*   No results for input(s): LIPASE, AMYLASE in the last 168 hours. Recent Labs  Lab 01/01/21 1350 01/02/21 0116  AMMONIA 37* 30   Coagulation Profile: No results for input(s): INR, PROTIME in the last 168 hours. Cardiac Enzymes: No results for input(s): CKTOTAL, CKMB, CKMBINDEX, TROPONINI in the last 168 hours. BNP (last 3 results) No results for input(s): PROBNP in the last 8760 hours. HbA1C: Recent Labs    01/02/21 0116  HGBA1C 8.2*   CBG: Recent Labs  Lab 01/02/21 0757 01/02/21 1202 01/02/21 1626 01/02/21 2021 01/03/21 0654  GLUCAP 116* 100* 169* 283* 177*   Lipid Profile: No results for input(s): CHOL, HDL, LDLCALC, TRIG, CHOLHDL, LDLDIRECT in the last 72 hours. Thyroid Function Tests: Recent Labs    01/01/21 1350  TSH 2.138   Anemia Panel: No results for input(s): VITAMINB12, FOLATE,  FERRITIN, TIBC, IRON, RETICCTPCT in the last 72 hours. Sepsis Labs: No results for input(s): PROCALCITON, LATICACIDVEN in the last 168 hours.  Recent Results (from the past 240 hour(s))  Resp Panel by RT-PCR (Flu A&B, Covid) Nasopharyngeal Swab     Status: None   Collection Time: 01/02/21  1:16 AM   Specimen: Nasopharyngeal Swab; Nasopharyngeal(NP) swabs in vial transport medium  Result Value Ref Range Status   SARS Coronavirus 2 by RT PCR NEGATIVE NEGATIVE Final    Comment: (NOTE) SARS-CoV-2 target nucleic acids are NOT DETECTED.  The SARS-CoV-2 RNA is generally detectable in upper respiratory specimens during the acute phase of infection. The lowest concentration of SARS-CoV-2 viral copies this assay can detect is 138 copies/mL. A negative result does not preclude SARS-Cov-2 infection and should not be used as the sole basis for treatment or other patient management decisions. A negative result may occur with  improper specimen collection/handling, submission of specimen other than nasopharyngeal swab, presence of viral mutation(s) within the areas targeted by this assay, and inadequate number of viral copies(<138 copies/mL). A negative result must be combined with clinical observations, patient history, and epidemiological information. The expected result is Negative.  Fact Sheet for Patients:  EntrepreneurPulse.com.au  Fact Sheet for Healthcare Providers:  IncredibleEmployment.be  This test is no t yet approved or cleared by the [place] FDA and  has been authorized for detection and/or diagnosis of SARS-CoV-2 by FDA under an Emergency Use Authorization (EUA). This EUA will remain  in effect (meaning this test can be used) for the duration of the COVID-19 declaration under Section 564(b)(1) of the Act, 21 U.S.C.section 360bbb-3(b)(1), unless the authorization is terminated  or revoked sooner.       Influenza A by PCR NEGATIVE  NEGATIVE Final   Influenza B by PCR NEGATIVE NEGATIVE Final    Comment: (NOTE) The Xpert Xpress SARS-CoV-2/FLU/RSV plus assay is intended as an aid in the diagnosis of influenza from Nasopharyngeal swab specimens and should not be used as a sole basis for treatment. Nasal washings and aspirates are unacceptable for Xpert Xpress SARS-CoV-2/FLU/RSV testing.  Fact Sheet for Patients: EntrepreneurPulse.com.au  Fact Sheet for Healthcare Providers: IncredibleEmployment.be  This test is not yet approved or cleared by the [place] FDA and has been authorized for detection and/or diagnosis  of SARS-CoV-2 by FDA under an Emergency Use Authorization (EUA). This EUA will remain in effect (meaning this test can be used) for the duration of the COVID-19 declaration under Section 564(b)(1) of the Act, 21 U.S.C. section 360bbb-3(b)(1), unless the authorization is terminated or revoked.  Performed at [facility], [place] [address] [place], [place] [address]          Radiology Studies: DG Chest 1 View  Result Date: 01/01/2021 CLINICAL DATA:  Altered level of consciousness, wheezing EXAM: CHEST  1 VIEW COMPARISON:  12/01/2019 FINDINGS: Single frontal view of the chest demonstrates a stable cardiac silhouette. No airspace disease, effusion, or pneumothorax. Chronic interstitial prominence consistent with known history of COPD. IMPRESSION: 1. No acute intrathoracic process. 2. Chronic interstitial scarring compatible with known history of COPD. Electronically Signed   By: Randa Ngo M.D.   On: 01/01/2021 23:44   DG Abd 1 View  Result Date: 01/02/2021 CLINICAL DATA:  Change in mental status EXAM: ABDOMEN - 1 VIEW COMPARISON:  None recent FINDINGS: Gas throughout the colon with some stool seen proximally. No obstructive pattern. No small bowel dilatation. No concerning mass effect or stone like calcification. Atheromatous calcifications are noted at the  pelvis and left upper quadrant. IMPRESSION: Nonobstructive bowel gas pattern with moderate colonic gas and stool. Electronically Signed   By: Monte Fantasia M.D.   On: 01/02/2021 05:30   CT Head Wo Contrast  Result Date: 01/01/2021 CLINICAL DATA:  Altered mental status. EXAM: CT HEAD WITHOUT CONTRAST TECHNIQUE: Contiguous axial images were obtained from the base of the skull through the vertex without intravenous contrast. COMPARISON:  January 01, 2021 (2:00 p.m.) FINDINGS: Brain: No evidence of acute infarction, hemorrhage, hydrocephalus, extra-axial collection or mass lesion/mass effect. 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 Vascular: No hyperdense vessel or unexpected calcification. Skull: Normal. Negative for fracture or focal lesion. Sinuses/Orbits: No acute finding. Other: None. IMPRESSION: 1. No acute intracranial abnormality. 2. 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 Electronically Signed   By: Virgina Norfolk M.D.   On: 01/01/2021 23:43   CT Head Wo Contrast  Result Date: 01/01/2021 CLINICAL DATA:  Psychotic symptoms for 3 days. EXAM: CT HEAD WITHOUT CONTRAST TECHNIQUE: Contiguous axial images were obtained from the base of the skull through the vertex without intravenous contrast. COMPARISON:  Head CT 09/29/2018. FINDINGS: Brain: No evidence of acute infarction, hemorrhage, hydrocephalus, extra-axial collection or mass lesion/mass effect. 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 Vascular: No hyperdense vessel or unexpected calcification. Skull: Intact.  No focal lesion. Sinuses/Orbits: Status post cataract surgery.  Otherwise negative. Other: None. IMPRESSION: No acute abnormality. 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 Electronically Signed   By: Inge Rise M.D.   On: 01/01/2021 14:12   MR BRAIN WO CONTRAST  Result Date: 01/02/2021 CLINICAL DATA:  Psychosis with mental status  change. EXAM: MRI HEAD WITHOUT CONTRAST TECHNIQUE: Multiplanar, multiecho pulse sequences of the brain and surrounding structures were obtained without intravenous contrast. COMPARISON:  Head CT from yesterday FINDINGS: Brain: Remote cortically based infarcts along the high right frontal parietal convexity, distal MCA to watershed territory. Chronic small vessel ischemia in the cerebral white matter and pons. White matter changes are greater on the right. Increased FLAIR signal within right-sided MCA branches. This is attributed to ICA findings described below. No acute hemorrhage, acute infarct, hydrocephalus, mass, or collection. Vascular: Loss of right carotid flow void in the neck on cavernous  segment with reconstitution by the supraclinoid segment. Skull and upper cervical spine: Normal marrow signal Sinuses/Orbits: Bilateral cataract resection. Other: Significant motion degradation with truncated study. Axial T1, gradient, and coronal T2 weighted images were not acquired. IMPRESSION: 1. No acute finding. 2. Slow or absent flow in the right cervical carotid with remote right cerebral infarcts. 3. Motion degraded study which was truncated. 4. Chronic small vessel disease. Electronically Signed   By: Monte Fantasia M.D.   On: 01/02/2021 07:37        Scheduled Meds: . allopurinol  100 mg Oral Daily  . amiodarone  100 mg Oral Daily  . amLODipine  10 mg Oral Daily  . apixaban  5 mg Oral BID  . atorvastatin  40 mg Oral QPM  . bisoprolol  5 mg Oral Daily  . clopidogrel  75 mg Oral Daily  . folic acid  1 mg Oral Daily  . insulin aspart  0-9 Units Subcutaneous TID WC  . ipratropium-albuterol  3 mL Nebulization QID  . levothyroxine  50 mcg Oral Daily  . losartan  100 mg Oral Daily  . methylPREDNISolone (SOLU-MEDROL) injection  60 mg Intravenous Q12H  . mometasone-formoterol  2 puff Inhalation BID  . nicotine  21 mg Transdermal Daily  . pantoprazole  40 mg Oral Daily  . spironolactone  25 mg Oral  Daily  . torsemide  20 mg Oral BID  . umeclidinium bromide  1 puff Inhalation Daily   Continuous Infusions: . azithromycin 500 mg (01/02/21 2202)  . cefTRIAXone (ROCEPHIN)  IV 1 g (01/02/21 1857)  . sodium bicarbonate (isotonic) 150 mEq in D5W 1000 mL infusion 75 mL/hr at 01/02/21 1130     LOS: 0 days    Time spent:40 min    Adaliah Hiegel, Geraldo Docker, MD [facility]   If 7PM-7AM, please contact night-coverage 01/03/2021, 11:55 AM

## 2021-01-03 NOTE — Plan of Care (Signed)

## 2021-01-03 NOTE — Progress Notes (Signed)
The patient appears to have moments of confusion but can be oriented back to normal.  She appears to have some depression and has been crying at times.  The patient needs assistance to the bathroom but tolerates well.

## 2021-01-04 DIAGNOSIS — J449 Chronic obstructive pulmonary disease, unspecified: Secondary | ICD-10-CM | POA: Diagnosis not present

## 2021-01-04 DIAGNOSIS — I10 Essential (primary) hypertension: Secondary | ICD-10-CM

## 2021-01-04 DIAGNOSIS — I130 Hypertensive heart and chronic kidney disease with heart failure and stage 1 through stage 4 chronic kidney disease, or unspecified chronic kidney disease: Secondary | ICD-10-CM | POA: Diagnosis present

## 2021-01-04 DIAGNOSIS — F419 Anxiety disorder, unspecified: Secondary | ICD-10-CM | POA: Diagnosis present

## 2021-01-04 DIAGNOSIS — E1151 Type 2 diabetes mellitus with diabetic peripheral angiopathy without gangrene: Secondary | ICD-10-CM

## 2021-01-04 DIAGNOSIS — R443 Hallucinations, unspecified: Secondary | ICD-10-CM | POA: Diagnosis present

## 2021-01-04 DIAGNOSIS — F1410 Cocaine abuse, uncomplicated: Secondary | ICD-10-CM | POA: Diagnosis present

## 2021-01-04 DIAGNOSIS — E669 Obesity, unspecified: Secondary | ICD-10-CM | POA: Diagnosis present

## 2021-01-04 DIAGNOSIS — N179 Acute kidney failure, unspecified: Secondary | ICD-10-CM | POA: Diagnosis present

## 2021-01-04 DIAGNOSIS — Z7901 Long term (current) use of anticoagulants: Secondary | ICD-10-CM | POA: Diagnosis not present

## 2021-01-04 DIAGNOSIS — E038 Other specified hypothyroidism: Secondary | ICD-10-CM

## 2021-01-04 DIAGNOSIS — Z888 Allergy status to other drugs, medicaments and biological substances status: Secondary | ICD-10-CM | POA: Diagnosis not present

## 2021-01-04 DIAGNOSIS — F1010 Alcohol abuse, uncomplicated: Secondary | ICD-10-CM | POA: Diagnosis present

## 2021-01-04 DIAGNOSIS — Z20822 Contact with and (suspected) exposure to covid-19: Secondary | ICD-10-CM | POA: Diagnosis present

## 2021-01-04 DIAGNOSIS — J9601 Acute respiratory failure with hypoxia: Secondary | ICD-10-CM | POA: Diagnosis present

## 2021-01-04 DIAGNOSIS — R400 Somnolence: Secondary | ICD-10-CM | POA: Diagnosis not present

## 2021-01-04 DIAGNOSIS — I2510 Atherosclerotic heart disease of native coronary artery without angina pectoris: Secondary | ICD-10-CM | POA: Diagnosis present

## 2021-01-04 DIAGNOSIS — E039 Hypothyroidism, unspecified: Secondary | ICD-10-CM | POA: Diagnosis present

## 2021-01-04 DIAGNOSIS — I480 Paroxysmal atrial fibrillation: Secondary | ICD-10-CM | POA: Diagnosis present

## 2021-01-04 DIAGNOSIS — Z794 Long term (current) use of insulin: Secondary | ICD-10-CM | POA: Diagnosis not present

## 2021-01-04 DIAGNOSIS — N184 Chronic kidney disease, stage 4 (severe): Secondary | ICD-10-CM | POA: Diagnosis present

## 2021-01-04 DIAGNOSIS — Z79899 Other long term (current) drug therapy: Secondary | ICD-10-CM | POA: Diagnosis not present

## 2021-01-04 DIAGNOSIS — J441 Chronic obstructive pulmonary disease with (acute) exacerbation: Secondary | ICD-10-CM

## 2021-01-04 DIAGNOSIS — Z23 Encounter for immunization: Secondary | ICD-10-CM | POA: Diagnosis not present

## 2021-01-04 DIAGNOSIS — R4182 Altered mental status, unspecified: Secondary | ICD-10-CM | POA: Diagnosis present

## 2021-01-04 DIAGNOSIS — I5042 Chronic combined systolic (congestive) and diastolic (congestive) heart failure: Secondary | ICD-10-CM | POA: Diagnosis present

## 2021-01-04 DIAGNOSIS — F32A Depression, unspecified: Secondary | ICD-10-CM | POA: Diagnosis present

## 2021-01-04 DIAGNOSIS — Z7951 Long term (current) use of inhaled steroids: Secondary | ICD-10-CM | POA: Diagnosis not present

## 2021-01-04 DIAGNOSIS — R441 Visual hallucinations: Secondary | ICD-10-CM | POA: Diagnosis present

## 2021-01-04 LAB — COMPREHENSIVE METABOLIC PANEL
ALT: 68 U/L — ABNORMAL HIGH (ref 0–44)
AST: 32 U/L (ref 15–41)
Albumin: 3.3 g/dL — ABNORMAL LOW (ref 3.5–5.0)
Alkaline Phosphatase: 150 U/L — ABNORMAL HIGH (ref 38–126)
Anion gap: 11 (ref 5–15)
BUN: 60 mg/dL — ABNORMAL HIGH (ref 6–20)
CO2: 22 mmol/L (ref 22–32)
Calcium: 9.1 mg/dL (ref 8.9–10.3)
Chloride: 103 mmol/L (ref 98–111)
Creatinine, Ser: 1.94 mg/dL — ABNORMAL HIGH (ref 0.44–1.00)
GFR, Estimated: 29 mL/min — ABNORMAL LOW (ref >60)
Glucose, Bld: 215 mg/dL — ABNORMAL HIGH (ref 70–99)
Potassium: 4.3 mmol/L (ref 3.5–5.1)
Sodium: 136 mmol/L (ref 135–145)
Total Bilirubin: 0.4 mg/dL (ref 0.3–1.2)
Total Protein: 6.8 g/dL (ref 6.5–8.1)

## 2021-01-04 LAB — CBC WITH DIFFERENTIAL/PLATELET
Abs Immature Granulocytes: 0.06 10*3/uL (ref 0.00–0.07)
Basophils Absolute: 0 10*3/uL (ref 0.0–0.1)
Basophils Relative: 0 %
Eosinophils Absolute: 0 10*3/uL (ref 0.0–0.5)
Eosinophils Relative: 0 %
HCT: 38 % (ref 36.0–46.0)
Hemoglobin: 12 g/dL (ref 12.0–15.0)
Immature Granulocytes: 1 %
Lymphocytes Relative: 9 %
Lymphs Abs: 1.1 10*3/uL (ref 0.7–4.0)
MCH: 29.9 pg (ref 26.0–34.0)
MCHC: 31.6 g/dL (ref 30.0–36.0)
MCV: 94.5 fL (ref 80.0–100.0)
Monocytes Absolute: 0.9 10*3/uL (ref 0.1–1.0)
Monocytes Relative: 7 %
Neutro Abs: 10.5 10*3/uL — ABNORMAL HIGH (ref 1.7–7.7)
Neutrophils Relative %: 83 %
Platelets: 267 10*3/uL (ref 150–400)
RBC: 4.02 MIL/uL (ref 3.87–5.11)
RDW: 14.8 % (ref 11.5–15.5)
WBC: 12.6 10*3/uL — ABNORMAL HIGH (ref 4.0–10.5)
nRBC: 0 % (ref 0.0–0.2)

## 2021-01-04 LAB — GLUCOSE, CAPILLARY
Glucose-Capillary: 203 mg/dL — ABNORMAL HIGH (ref 70–99)
Glucose-Capillary: 285 mg/dL — ABNORMAL HIGH (ref 70–99)
Glucose-Capillary: 158 mg/dL — ABNORMAL HIGH (ref 70–99)
Glucose-Capillary: 137 mg/dL — ABNORMAL HIGH (ref 70–99)

## 2021-01-04 LAB — MAGNESIUM: Magnesium: 2.3 mg/dL (ref 1.7–2.4)

## 2021-01-04 LAB — PHOSPHORUS: Phosphorus: 3.3 mg/dL (ref 2.5–4.6)

## 2021-01-04 MED ORDER — PREDNISONE 20 MG PO TABS
40.0000 mg | ORAL_TABLET | Freq: Every day | ORAL | Status: DC
  Administered 2021-01-05: 40 mg via ORAL
  Filled 2021-01-04: qty 2

## 2021-01-04 MED ORDER — IPRATROPIUM-ALBUTEROL 0.5-2.5 (3) MG/3ML IN SOLN
3.0000 mL | Freq: Two times a day (BID) | RESPIRATORY_TRACT | Status: DC
  Administered 2021-01-04 – 2021-01-05 (×2): 3 mL via RESPIRATORY_TRACT
  Filled 2021-01-04 (×2): qty 3

## 2021-01-04 MED ORDER — AZITHROMYCIN 500 MG PO TABS
500.0000 mg | ORAL_TABLET | Freq: Every day | ORAL | Status: DC
  Administered 2021-01-04: 500 mg via ORAL
  Filled 2021-01-04 (×2): qty 1

## 2021-01-04 MED ORDER — INSULIN GLARGINE 100 UNIT/ML ~~LOC~~ SOLN
20.0000 [IU] | Freq: Every day | SUBCUTANEOUS | Status: DC
  Administered 2021-01-04 – 2021-01-05 (×2): 20 [IU] via SUBCUTANEOUS
  Filled 2021-01-04 (×2): qty 0.2

## 2021-01-04 MED ORDER — HYDRALAZINE HCL 10 MG PO TABS
10.0000 mg | ORAL_TABLET | Freq: Three times a day (TID) | ORAL | Status: DC
  Administered 2021-01-04 – 2021-01-05 (×3): 10 mg via ORAL
  Filled 2021-01-04 (×4): qty 1

## 2021-01-04 NOTE — Progress Notes (Signed)
PROGRESS NOTE    [name]  [id number] DOB: 1961/04/19 DOA: 01/01/2021 PCP: Loretha Brasil, FNP (Inactive)     Brief Narrative:  60 y.o. WF PMHx Depression, Anxiety,, Polysubstance abuse (EtOH abuse, cocaine abuse, narcotic abuse, marijuana use),  type II uncontrolled with peripheral neuropathy, Paroxysmal atrial fibrillation anticoagulated on Eliquis  HPI/review of systems unable to be obtained on initial interview with patient.  ED Course: Initially presented complaining of hallucinations.  Ativan was provided prior to taking patient down for MRI, on arrival to MRI patient was quite somnolent, MRI had to be canceled.  Patient was arousable but not conversant, not following commands.  Presumed Ativan adverse effect, repeat labs showed no obvious other metabolic derangement, showed stable COPD no acute change, kept for observation.  UDS pending   Subjective: 2/21 afebrile overnight A/O x4 - CP, negative nausea, negative abdominal pain.   Assessment & Plan: Covid vaccination; vaccinated 3/3   Active Problems:   DM (diabetes mellitus), type 2 with peripheral vascular complications ([place])   History of cocaine abuse ([place])   Hypothyroidism   S/P peripheral artery angioplasty - TurboHawk atherectomy; R SFA   Claudication ([name])   PAF (paroxysmal atrial fibrillation) ([name])   COPD GOLD 0    CKD (chronic kidney disease), stage III ([name])   AKI (acute kidney injury) ([place])   PAD (peripheral artery disease) ([name])   Altered mental status   AF (paroxysmal atrial fibrillation) ([name])   CAD (coronary artery disease)   PVD (peripheral vascular disease) ([place])   Acute renal failure superimposed on stage 4 chronic kidney disease ([place])   Diabetes mellitus type 2, uncontrolled, with complications ([name])   Diabetic nephropathy ([name])  Paroxysmal atrial fibrillation -Amiodarone 1 mg daily -Norvasc 10 mg daily -Eliquis 5 mg BID -Bisoprolol 5 mg daily -Spironolactone 25 mg daily (hold)  worsening renal failure -Torsemide 20 mg BID (hold) worsening renal failure -2/21 hydralazine 10 mg TID  CAD/PVD -See atrial fibrillation  Essential HTN -See A. fib  Acute respiratory failure with hypoxia/COPD exacerbation -Solu-Medrol 60 mg bid (DC on 2/21) -DuoNeb QID -Incentive spirometry -Flutter valve -Continuous pulse ox -2/21 Prednisone 40 mg -Patient meets criteria for home O2 Patient Saturations on Room Air at Rest = 96% Patient Saturations on Room Air while Ambulating = 87% Patient Saturations on 0 Liters of oxygen while Ambulating = 0% Please briefly explain why patient needs home oxygen:  O2 sats dropped to 87% only when walking but went back to 94% after sitting at room air. -2 L O2 titrate to maintain SPO2> 93% -Provide Inogen portable home O2 concentrator  Tobacco abuse -Counseled at length concerning the fact that she has significant COPD and should never smoke again. -Nicotine patch  Acute mental status change -Most likely iatrogenic secondary to Ativan use for MRI -MRI brain nondiagnostic see results below -2/19 UDS negative  Acute on CKD stage IV (baseline Cr 2.05) Lab Results  Component Value Date   CREATININE 1.94 (H) 01/04/2021   CREATININE 2.05 (H) 01/03/2021   CREATININE 2.58 (H) 01/02/2021   CREATININE 2.90 (H) 01/01/2021   CREATININE 2.93 (H) 01/01/2021  -See A. Fib -2/20 DC sodium bicarb. -2/20 normal saline 2m/hr -Strict in and out +1.0 L -Daily weight Filed Weights   01/01/21 1718 01/03/21 0500  Weight: 103.4 kg 103.5 kg  -2/20 patient states has appointment with Nephrology on 2/24   DM type II uncontrolled with complication/DM nephropathy -2/19 hemoglobin A1c= 8.2 -NovoLog NovoLog 8 units qac -2/22 Lantus 20  units daily -Sensitive SSI  Goals of care -2/20 PT/OT consult; patient with significant COPD exacerbation evaluate for SNF vs H/H    DVT prophylaxis: Eliquis Code Status: Full Family Communication:  Status is:  Inpatient    Dispo: The patient is from: Home              Anticipated d/c is to: Home              Anticipated d/c date is: 2/21              Patient currently unstable      Consultants:    Procedures/Significant Events:    I have personally reviewed and interpreted all radiology studies and my findings are as above.  VENTILATOR SETTINGS: Room air 2/20 SPO2 98%   Cultures 2/19 SARS coronavirus negative 2/19 influenza A/B negative   Antimicrobials: Anti-infectives (From admission, onward)   Start     Ordered Stop   01/02/21 1845  azithromycin (ZITHROMAX) 500 mg in sodium chloride 0.9 % 250 mL IVPB        01/02/21 1657     01/02/21 1800  cefTRIAXone (ROCEPHIN) 1 g in sodium chloride 0.9 % 100 mL IVPB        01/02/21 1657         Devices    LINES / TUBES:      Continuous Infusions: . sodium chloride 75 mL/hr at 01/03/21 1654  . azithromycin 500 mg (01/02/21 2202)  . cefTRIAXone (ROCEPHIN)  IV 1 g (01/03/21 1657)     Objective: Vitals:   01/03/21 2000 01/04/21 0400 01/04/21 0804 01/04/21 0843  BP: (!) 141/54 (!) 154/56 (!) 160/69   Pulse: 60 67 66   Resp: '19 15 17   '$ Temp: 98.3 F (36.8 C) 98 F (36.7 C) 97.8 F (36.6 C)   TempSrc: Oral  Oral   SpO2: 96% 95% 98% 98%  Weight:      Height:        Intake/Output Summary (Last 24 hours) at 01/04/2021 0935 Last data filed at 01/04/2021 [id number] Gross per 24 hour  Intake 580.16 ml  Output --  Net 580.16 ml   Filed Weights   01/01/21 1718 01/03/21 0500  Weight: 103.4 kg 103.5 kg   Physical Exam:  General: A/O x4, No acute respiratory distress Eyes: negative scleral hemorrhage, negative anisocoria, negative icterus ENT: Negative Runny nose, negative gingival bleeding, Neck:  Negative scars, masses, torticollis, lymphadenopathy, JVD Lungs: decreased breath sounds bilaterally (improved from 2/19) without wheezes or crackles Cardiovascular: Regular rate and rhythm without murmur gallop or rub  normal S1 and S2 Abdomen: OBESE, negative abdominal pain, nondistended, positive soft, bowel sounds, no rebound, no ascites, no appreciable mass Extremities: No significant cyanosis, clubbing, or edema bilateral lower extremities Skin: Negative rashes, lesions, ulcers Psychiatric:  Negative depression, negative anxiety, negative fatigue, negative mania  Central nervous system:  Cranial nerves II through XII intact, tongue/uvula midline, all extremities muscle strength 5/5, sensation intact throughout, negative dysarthria, negative expressive aphasia, negative receptive aphasia.  .     Data Reviewed: Care during the described time interval was provided by me .  I have reviewed this patient's available data, including medical history, events of note, physical examination, and all test results as part of my evaluation.  CBC: Recent Labs  Lab 01/01/21 1230 01/01/21 2054 01/02/21 0607 01/02/21 1055 01/03/21 1209 01/04/21 0602  WBC 9.2  --  10.9*  --  9.9 12.6*  NEUTROABS  --   --   --   --  8.7* 10.5*  HGB 12.2 11.6*  11.6* 12.4 11.2* 12.3 12.0  HCT 39.2 34.0*  34.0* 41.9 33.0* 40.7 38.0  MCV 97.0  --  98.8  --  96.2 94.5  PLT 271  --  264  --  291 [id number]   Basic Metabolic Panel: Recent Labs  Lab 01/01/21 1230 01/01/21 2054 01/02/21 0607 01/02/21 1055 01/03/21 1209 01/04/21 0602  NA 139 140  140 140 141 136 136  K 5.5* 5.5*  5.5* 4.3 4.4 4.4 4.3  CL 108 114* 108  --  101 103  CO2 18*  --  17*  --  21* 22  GLUCOSE 87 68* 125*  --  292* 215*  BUN 64* 80* 62*  --  59* 60*  CREATININE 2.93* 2.90* 2.58*  --  2.05* 1.94*  CALCIUM 8.4*  --  8.5*  --  9.1 9.1  MG  --   --   --   --  2.3 2.3  PHOS  --   --   --   --  4.0 3.3   GFR: Estimated Creatinine Clearance: 37 mL/min (A) (by C-G formula based on SCr of 1.94 mg/dL (H)). Liver Function Tests: Recent Labs  Lab 01/01/21 1230 01/02/21 0607 01/03/21 1209 01/04/21 0602  AST 57* 87* 59* 32  ALT 79* 103* 93* 68*  ALKPHOS  168* 160* 161* 150*  BILITOT 0.7 0.3 <0.1* 0.4  PROT 6.9 7.0 7.1 6.8  ALBUMIN 3.5 3.4* 3.5 3.3*   No results for input(s): LIPASE, AMYLASE in the last 168 hours. Recent Labs  Lab 01/01/21 1350 01/02/21 0116  AMMONIA 37* 30   Coagulation Profile: No results for input(s): INR, PROTIME in the last 168 hours. Cardiac Enzymes: No results for input(s): CKTOTAL, CKMB, CKMBINDEX, TROPONINI in the last 168 hours. BNP (last 3 results) No results for input(s): PROBNP in the last 8760 hours. HbA1C: Recent Labs    01/02/21 0116  HGBA1C 8.2*   CBG: Recent Labs  Lab 01/03/21 0654 01/03/21 1158 01/03/21 1550 01/03/21 2100 01/04/21 0802  GLUCAP 177* 263* 392* 351* 203*   Lipid Profile: No results for input(s): CHOL, HDL, LDLCALC, TRIG, CHOLHDL, LDLDIRECT in the last 72 hours. Thyroid Function Tests: Recent Labs    01/01/21 1350  TSH 2.138   Anemia Panel: No results for input(s): VITAMINB12, FOLATE, FERRITIN, TIBC, IRON, RETICCTPCT in the last 72 hours. Sepsis Labs: No results for input(s): PROCALCITON, LATICACIDVEN in the last 168 hours.  Recent Results (from the past 240 hour(s))  Resp Panel by RT-PCR (Flu A&B, Covid) Nasopharyngeal Swab     Status: None   Collection Time: 01/02/21  1:16 AM   Specimen: Nasopharyngeal Swab; Nasopharyngeal(NP) swabs in vial transport medium  Result Value Ref Range Status   SARS Coronavirus 2 by RT PCR NEGATIVE NEGATIVE Final    Comment: (NOTE) SARS-CoV-2 target nucleic acids are NOT DETECTED.  The SARS-CoV-2 RNA is generally detectable in upper respiratory specimens during the acute phase of infection. The lowest concentration of SARS-CoV-2 viral copies this assay can detect is 138 copies/mL. A negative result does not preclude SARS-Cov-2 infection and should not be used as the sole basis for treatment or other patient management decisions. A negative result may occur with  improper specimen collection/handling, submission of specimen  other than nasopharyngeal swab, presence of viral mutation(s) within the areas targeted by this assay, and inadequate number of viral copies(<138 copies/mL). A negative result must be combined with clinical observations, patient history, and epidemiological information. The  expected result is Negative.  Fact Sheet for Patients:  EntrepreneurPulse.com.au  Fact Sheet for Healthcare Providers:  IncredibleEmployment.be  This test is no t yet approved or cleared by the [place] FDA and  has been authorized for detection and/or diagnosis of SARS-CoV-2 by FDA under an Emergency Use Authorization (EUA). This EUA will remain  in effect (meaning this test can be used) for the duration of the COVID-19 declaration under Section 564(b)(1) of the Act, 21 U.S.C.section 360bbb-3(b)(1), unless the authorization is terminated  or revoked sooner.       Influenza A by PCR NEGATIVE NEGATIVE Final   Influenza B by PCR NEGATIVE NEGATIVE Final    Comment: (NOTE) The Xpert Xpress SARS-CoV-2/FLU/RSV plus assay is intended as an aid in the diagnosis of influenza from Nasopharyngeal swab specimens and should not be used as a sole basis for treatment. Nasal washings and aspirates are unacceptable for Xpert Xpress SARS-CoV-2/FLU/RSV testing.  Fact Sheet for Patients: EntrepreneurPulse.com.au  Fact Sheet for Healthcare Providers: IncredibleEmployment.be  This test is not yet approved or cleared by the [place] FDA and has been authorized for detection and/or diagnosis of SARS-CoV-2 by FDA under an Emergency Use Authorization (EUA). This EUA will remain in effect (meaning this test can be used) for the duration of the COVID-19 declaration under Section 564(b)(1) of the Act, 21 U.S.C. section 360bbb-3(b)(1), unless the authorization is terminated or revoked.  Performed at [facility], [place] [address] [place],  [place] [address]          Radiology Studies: No results found.      Scheduled Meds: . allopurinol  100 mg Oral Daily  . amiodarone  100 mg Oral Daily  . amLODipine  10 mg Oral Daily  . apixaban  5 mg Oral BID  . atorvastatin  40 mg Oral QPM  . bisoprolol  5 mg Oral Daily  . clopidogrel  75 mg Oral Daily  . folic acid  1 mg Oral Daily  . insulin aspart  0-9 Units Subcutaneous TID WC  . insulin aspart  8 Units Subcutaneous TID WC  . ipratropium-albuterol  3 mL Nebulization QID  . levothyroxine  50 mcg Oral Daily  . losartan  100 mg Oral Daily  . methylPREDNISolone (SOLU-MEDROL) injection  60 mg Intravenous Q12H  . mometasone-formoterol  2 puff Inhalation BID  . nicotine  21 mg Transdermal Daily  . pantoprazole  40 mg Oral Daily  . umeclidinium bromide  1 puff Inhalation Daily   Continuous Infusions: . sodium chloride 75 mL/hr at 01/03/21 1654  . azithromycin 500 mg (01/02/21 2202)  . cefTRIAXone (ROCEPHIN)  IV 1 g (01/03/21 1657)     LOS: 0 days    Time spent:40 min    Alianah Lofton, Geraldo Docker, MD [facility]   If 7PM-7AM, please contact night-coverage 01/04/2021, 9:35 AM

## 2021-01-04 NOTE — Plan of Care (Signed)

## 2021-01-04 NOTE — Evaluation (Signed)
Occupational Therapy Evaluation Patient Details Name: [name] MRN: [id number] DOB: Apr 14, 1961 Today's Date: 01/04/2021    History of Present Illness Pt is a 60 y/o female with a PMH significant for polysubstance abuse, CAD s/p catheterization, PVD, transmetatarsal amputation, history of cellulitis and osteomyelitis, CKD 3, COPD, type 2 diabetes, depression, paroxysmal a-fib anticoagulated on Eliquis. She initially presented to the ED with hallucinations.   Clinical Impression   Pt is at Mod I - Sup level with ADLs and ADL transfers. Pt was asked if she knew why she was in the hospital and pt replied " well I had some hallucinations and I had smoked a couple pf joints and taken my medication I think". No further acute OT services are indicated at this time, OT will sign off    Follow Up Recommendations  No OT follow up;Supervision - Intermittent    Equipment Recommendations  None recommended by OT    Recommendations for Other Services       Precautions / Restrictions Precautions Precautions: Fall Restrictions Weight Bearing Restrictions: No      Mobility Bed Mobility               General bed mobility comments: Pt was received sitting up in the recliner chair    Transfers Overall transfer level: Needs assistance Equipment used: Rolling walker (2 wheeled) Transfers: Sit to/from Stand Sit to Stand: Supervision         General transfer comment: Pt demonstrated proper hand placement on seated surface for safety. No assist required.    Balance Overall balance assessment: Needs assistance Sitting-balance support: Feet supported;No upper extremity supported Sitting balance-Leahy Scale: Fair     Standing balance support: Bilateral upper extremity supported;During functional activity Standing balance-Leahy Scale: Poor Standing balance comment: Reliant on UE support for balance                           ADL either performed or assessed with  clinical judgement   ADL Overall ADL's : Modified independent                                       General ADL Comments: sup with toilet transfers     Vision Baseline Vision/History: Wears glasses Wears Glasses: Reading only       Perception     Praxis      Pertinent Vitals/Pain Pain Assessment: No/denies pain     Hand Dominance Left   Extremity/Trunk Assessment Upper Extremity Assessment Upper Extremity Assessment: Overall WFL for tasks assessed   Lower Extremity Assessment Lower Extremity Assessment: Defer to PT evaluation   Cervical / Trunk Assessment Cervical / Trunk Assessment: Normal   Communication Communication Communication: No difficulties   Cognition Arousal/Alertness: Awake/alert Behavior During Therapy: WFL for tasks assessed/performed Overall Cognitive Status: Impaired/Different from baseline Area of Impairment: Memory;Orientation;Safety/judgement;Problem solving                 Orientation Level: Disoriented to;Time;Place   Memory: Decreased short-term memory   Safety/Judgement: Decreased awareness of safety   Problem Solving: Slow processing;Requires verbal cues     General Comments       Exercises     Shoulder Instructions      Home Living Family/patient expects to be discharged to:: Private residence Living Arrangements: Other relatives;Non-relatives/Friends Available Help at Discharge: Family;Available 24 hours/day Type of Home: [place]  Access: Stairs to enter [employer] of Steps: 3-4   Home Layout: Two level;Bed/bath upstairs Alternate Level Stairs-Number of Steps: flight Alternate Level Stairs-Rails: Right;Left Bathroom Shower/Tub: Occupational psychologist: Standard     Home Equipment: Environmental consultant - 4 wheels;Cane - single point;Walker - 2 wheels;Bedside commode;Hospital bed;Shower seat;Adaptive equipment Adaptive Equipment: Reacher Additional Comments: Pt reports living with  sister now (was living with her cousin)      Prior Functioning/Environment Level of Independence: Independent with assistive device(s)  Gait / Transfers Assistance Needed: reports using cane or rollater sometimes ADL's / Homemaking Assistance Needed: reports Ind with ADLs/selfcare   Comments: Uses cane or rollator at baseline        OT Problem List: Decreased activity tolerance      OT Treatment/Interventions:      OT Goals(Current goals can be found in the care plan section) Acute Rehab OT Goals Patient Stated Goal: Return home with sister  OT Frequency:     Barriers to D/C:            Co-evaluation              AM-PAC OT "6 Clicks" Daily Activity     Outcome Measure Help from another person eating meals?: None Help from another person taking care of personal grooming?: None Help from another person toileting, which includes using toliet, bedpan, or urinal?: None Help from another person bathing (including washing, rinsing, drying)?: None Help from another person to put on and taking off regular upper body clothing?: None Help from another person to put on and taking off regular lower body clothing?: None 6 Click Score: 24   End of Session Equipment Utilized During Treatment: Gait belt;Rolling walker  Activity Tolerance: Patient tolerated treatment well Patient left: in chair;with call bell/phone within reach;with chair alarm set                   Time: 1021-1041 OT Time Calculation (min): 20 min Charges:  OT General Charges $OT Visit: 1 Visit OT Evaluation $OT Eval Moderate Complexity: 1 Mod    Britt Bottom 01/04/2021, 3:22 PM

## 2021-01-04 NOTE — Evaluation (Signed)
Physical Therapy Evaluation Patient Details Name: [name] MRN: [id number] DOB: [birth year] Today's Date: 01/04/2021   History of Present Illness  Pt is a 60 y/o female with a PMH significant for polysubstance abuse, CAD s/p catheterization, PVD, transmetatarsal amputation, history of cellulitis and osteomyelitis, CKD 3, COPD, type 2 diabetes, depression, paroxysmal a-fib anticoagulated on Eliquis. She initially presented to the ED with hallucinations.    Clinical Impression  Pt admitted with above diagnosis. Pt currently with functional limitations due to the deficits listed below (see PT Problem List). At the time of PT eval pt was able to perform transfers and ambulation with gross min guard assist progressing to supervision for safety and RW for support. Pt reports utilizing the rollator mostly at home. It appears pt is at or near her baseline of function per pt report. She does not wish for home health services at this time. Acute PT will keep on caseload to monitor for deconditioning and functional decline while admitted. Pt will benefit from skilled PT to increase their independence and safety with mobility to allow discharge to the venue listed below.       Follow Up Recommendations No PT follow up;Supervision - Intermittent    Equipment Recommendations  None recommended by PT    Recommendations for Other Services       Precautions / Restrictions Precautions Precautions: Fall Restrictions Weight Bearing Restrictions: No      Mobility  Bed Mobility               General bed mobility comments: Pt was received sitting up in the recliner chair    Transfers Overall transfer level: Needs assistance Equipment used: Rolling walker (2 wheeled) Transfers: Sit to/from Stand Sit to Stand: Supervision         General transfer comment: Pt demonstrated proper hand placement on seated surface for safety. No assist required.  Ambulation/Gait Ambulation/Gait assistance:  Min guard;Supervision Gait Distance (Feet): 200 Feet Assistive device: Rolling walker (2 wheeled) Gait Pattern/deviations: Step-through pattern;Decreased stride length;Trunk flexed Gait velocity: Decreased Gait velocity interpretation: 1.31 - 2.62 ft/sec, indicative of limited community ambulator General Gait Details: Min guard assist progressing to supervision for safety. Pt talking throughout gait training and with no notable dyspnea.  Stairs            Wheelchair Mobility    Modified Rankin (Stroke Patients Only)       Balance Overall balance assessment: Needs assistance Sitting-balance support: Feet supported;No upper extremity supported Sitting balance-Leahy Scale: Fair     Standing balance support: Bilateral upper extremity supported;During functional activity Standing balance-Leahy Scale: Poor Standing balance comment: Reliant on UE support for balance                             Pertinent Vitals/Pain Pain Assessment: No/denies pain    Home Living Family/patient expects to be discharged to:: Private residence Living Arrangements: Other relatives;Non-relatives/Friends Available Help at Discharge: Family;Available 24 hours/day Type of Home: House Home Access: Stairs to enter   [employer] of Steps: 3-4 Home Layout: Two level;Bed/bath upstairs Home Equipment: Walker - 4 wheels;Cane - single point;Walker - 2 wheels;Bedside commode;Hospital bed;Shower seat;Adaptive equipment Additional Comments: Pt reports living with her cousin but at d/c will be moving in with her sister    Prior Function Level of Independence: Independent with assistive device(s)         Comments: Uses cane or rollator at baseline  Hand Dominance   Dominant Hand: Left    Extremity/Trunk Assessment   Upper Extremity Assessment Upper Extremity Assessment: Defer to OT evaluation    Lower Extremity Assessment Lower Extremity Assessment: Generalized  weakness    Cervical / Trunk Assessment Cervical / Trunk Assessment: Normal  Communication   Communication: No difficulties  Cognition Arousal/Alertness: Awake/alert Behavior During Therapy: WFL for tasks assessed/performed Overall Cognitive Status: Impaired/Different from baseline Area of Impairment: Memory;Orientation;Safety/judgement;Problem solving                 Orientation Level: Disoriented to;Situation;Time   Memory: Decreased short-term memory   Safety/Judgement: Decreased awareness of safety   Problem Solving: Slow processing;Requires verbal cues        General Comments      Exercises     Assessment/Plan    PT Assessment Patient needs continued PT services  PT Problem List Decreased strength;Decreased activity tolerance;Decreased balance;Decreased mobility;Decreased knowledge of use of DME;Decreased safety awareness;Decreased knowledge of precautions;Cardiopulmonary status limiting activity       PT Treatment Interventions DME instruction;Gait training;Stair training;Functional mobility training;Therapeutic activities;Therapeutic exercise;Neuromuscular re-education;Patient/family education;Cognitive remediation    PT Goals (Current goals can be found in the Care Plan section)  Acute Rehab PT Goals Patient Stated Goal: Return home with sister PT Goal Formulation: With patient Time For Goal Achievement: 01/11/21 Potential to Achieve Goals: Good    Frequency Min 3X/week   Barriers to discharge        Co-evaluation               AM-PAC PT "6 Clicks" Mobility  Outcome Measure Help needed turning from your back to your side while in a flat bed without using bedrails?: None Help needed moving from lying on your back to sitting on the side of a flat bed without using bedrails?: None Help needed moving to and from a bed to a chair (including a wheelchair)?: A Little Help needed standing up from a chair using your arms (e.g., wheelchair or  bedside chair)?: A Little Help needed to walk in hospital room?: A Little Help needed climbing 3-5 steps with a railing? : A Little 6 Click Score: 20    End of Session Equipment Utilized During Treatment: Gait belt Activity Tolerance: Patient tolerated treatment well Patient left: in chair;with call bell/phone within reach;with chair alarm set Nurse Communication: Mobility status PT Visit Diagnosis: Unsteadiness on feet (R26.81);Muscle weakness (generalized) (M62.81)    Time: [id number] PT Time Calculation (min) (ACUTE ONLY): 15 min   Charges:   PT Evaluation $PT Eval Moderate Complexity: 1 Mod          Rolinda Roan, PT, DPT Acute Rehabilitation Services Pager: [phone] Office: [phone]   Thelma Comp 01/04/2021, 1:36 PM

## 2021-01-04 NOTE — Progress Notes (Signed)
Inpatient Diabetes Program Recommendations  AACE/ADA: New Consensus Statement on Inpatient Glycemic Control (2015)  Target Ranges:  Prepandial:   less than 140 mg/dL      Peak postprandial:   less than 180 mg/dL (1-2 hours)      Critically ill patients:  140 - 180 mg/dL   Lab Results  Component Value Date   GLUCAP 203 (H) 01/04/2021   HGBA1C 8.2 (H) 01/02/2021    Review of Glycemic Control  Diabetes history: DM 2 Outpatient Diabetes medications: Lantus 39 units qhs, Humalog 15-20 units tid, Jardiance 25 mg Daily Current orders for Inpatient glycemic control:  Novolog 0-9 units tid Novolog 8 units tid meal coverage  A1c 8.2 on 2/19 Solumedrol 60 mg Q12 hours  Inpatient Diabetes Program Recommendations:    Pt on basal insulin at home.  -  Consider adding Lantus 15 units  Thanks,  Tama Headings RN, MSN, BC-ADM Inpatient Diabetes Coordinator Team Pager [phone] (8a-5p)

## 2021-01-04 NOTE — Plan of Care (Signed)

## 2021-01-04 NOTE — Progress Notes (Signed)
SATURATION QUALIFICATIONS: (This note is used to comply with regulatory documentation for home oxygen)  Patient Saturations on Room Air at Rest = 96%  Patient Saturations on Room Air while Ambulating = 87%  Patient Saturations on 0 Liters of oxygen while Ambulating = 0%  Please briefly explain why patient needs home oxygen:  O2 sats dropped to 87% only when walking but went back to 94% after sitting at room air.

## 2021-01-05 ENCOUNTER — Encounter (HOSPITAL_COMMUNITY): Payer: Medicare Other | Admitting: Cardiology

## 2021-01-05 ENCOUNTER — Other Ambulatory Visit: Payer: Self-pay | Admitting: Internal Medicine

## 2021-01-05 ENCOUNTER — Ambulatory Visit (HOSPITAL_COMMUNITY): Payer: Medicare Other

## 2021-01-05 DIAGNOSIS — I480 Paroxysmal atrial fibrillation: Secondary | ICD-10-CM | POA: Diagnosis not present

## 2021-01-05 DIAGNOSIS — N179 Acute kidney failure, unspecified: Secondary | ICD-10-CM | POA: Diagnosis not present

## 2021-01-05 DIAGNOSIS — R400 Somnolence: Secondary | ICD-10-CM | POA: Diagnosis not present

## 2021-01-05 DIAGNOSIS — R441 Visual hallucinations: Secondary | ICD-10-CM | POA: Diagnosis not present

## 2021-01-05 LAB — CBC WITH DIFFERENTIAL/PLATELET
Abs Immature Granulocytes: 0.03 10*3/uL (ref 0.00–0.07)
Basophils Absolute: 0 10*3/uL (ref 0.0–0.1)
Basophils Relative: 0 %
Eosinophils Absolute: 0.1 10*3/uL (ref 0.0–0.5)
Eosinophils Relative: 1 %
HCT: 35.5 % — ABNORMAL LOW (ref 36.0–46.0)
Hemoglobin: 11.5 g/dL — ABNORMAL LOW (ref 12.0–15.0)
Immature Granulocytes: 0 %
Lymphocytes Relative: 25 %
Lymphs Abs: 2.1 10*3/uL (ref 0.7–4.0)
MCH: 30.7 pg (ref 26.0–34.0)
MCHC: 32.4 g/dL (ref 30.0–36.0)
MCV: 94.9 fL (ref 80.0–100.0)
Monocytes Absolute: 0.7 10*3/uL (ref 0.1–1.0)
Monocytes Relative: 8 %
Neutro Abs: 5.6 10*3/uL (ref 1.7–7.7)
Neutrophils Relative %: 66 %
Platelets: 241 10*3/uL (ref 150–400)
RBC: 3.74 MIL/uL — ABNORMAL LOW (ref 3.87–5.11)
RDW: 15.3 % (ref 11.5–15.5)
WBC: 8.5 10*3/uL (ref 4.0–10.5)
nRBC: 0 % (ref 0.0–0.2)

## 2021-01-05 LAB — COMPREHENSIVE METABOLIC PANEL
ALT: 51 U/L — ABNORMAL HIGH (ref 0–44)
AST: 26 U/L (ref 15–41)
Albumin: 3 g/dL — ABNORMAL LOW (ref 3.5–5.0)
Alkaline Phosphatase: 124 U/L (ref 38–126)
Anion gap: 8 (ref 5–15)
BUN: 55 mg/dL — ABNORMAL HIGH (ref 6–20)
CO2: 21 mmol/L — ABNORMAL LOW (ref 22–32)
Calcium: 8.4 mg/dL — ABNORMAL LOW (ref 8.9–10.3)
Chloride: 108 mmol/L (ref 98–111)
Creatinine, Ser: 1.57 mg/dL — ABNORMAL HIGH (ref 0.44–1.00)
GFR, Estimated: 38 mL/min — ABNORMAL LOW (ref >60)
Glucose, Bld: 223 mg/dL — ABNORMAL HIGH (ref 70–99)
Potassium: 4.3 mmol/L (ref 3.5–5.1)
Sodium: 137 mmol/L (ref 135–145)
Total Bilirubin: 0.2 mg/dL — ABNORMAL LOW (ref 0.3–1.2)
Total Protein: 5.9 g/dL — ABNORMAL LOW (ref 6.5–8.1)

## 2021-01-05 LAB — URINE DRUGS OF ABUSE SCREEN W ALC, ROUTINE (REF LAB)
Amphetamines, Urine: NEGATIVE ng/mL
Barbiturate, Ur: NEGATIVE ng/mL
Benzodiazepine Quant, Ur: NEGATIVE ng/mL
Cannabinoid Quant, Ur: NEGATIVE ng/mL
Cocaine (Metab.): NEGATIVE ng/mL
Ethanol U, Quan: NEGATIVE %
Methadone Screen, Urine: NEGATIVE ng/mL
Opiate Quant, Ur: NEGATIVE ng/mL
Phencyclidine, Ur: NEGATIVE ng/mL
Propoxyphene, Urine: NEGATIVE ng/mL

## 2021-01-05 LAB — PHOSPHORUS: Phosphorus: 3.6 mg/dL (ref 2.5–4.6)

## 2021-01-05 LAB — MAGNESIUM: Magnesium: 2.3 mg/dL (ref 1.7–2.4)

## 2021-01-05 LAB — GLUCOSE, CAPILLARY
Glucose-Capillary: 164 mg/dL — ABNORMAL HIGH (ref 70–99)
Glucose-Capillary: 201 mg/dL — ABNORMAL HIGH (ref 70–99)

## 2021-01-05 MED ORDER — PREDNISONE 20 MG PO TABS: 40.0000 mg | ORAL_TABLET | Freq: Every day | ORAL | 0 refills | Status: DC

## 2021-01-05 MED ORDER — CEFDINIR 300 MG PO CAPS
300.0000 mg | ORAL_CAPSULE | Freq: Two times a day (BID) | ORAL | Status: DC
  Administered 2021-01-05: 300 mg via ORAL
  Filled 2021-01-05 (×2): qty 1

## 2021-01-05 MED ORDER — INFLUENZA VAC SPLIT QUAD 0.5 ML IM SUSY: 0.5000 mL | PREFILLED_SYRINGE | INTRAMUSCULAR | Status: DC

## 2021-01-05 MED ORDER — CEFIXIME 400 MG PO CAPS: 400.0000 mg | ORAL_CAPSULE | Freq: Every day | ORAL | Status: DC

## 2021-01-05 MED ORDER — INSULIN GLARGINE 100 UNITS/ML SOLOSTAR PEN: 20.0000 [IU] | PEN_INJECTOR | Freq: Every day | SUBCUTANEOUS | 0 refills | Status: DC

## 2021-01-05 MED ORDER — INSULIN LISPRO (1 UNIT DIAL) 100 UNIT/ML (KWIKPEN): 8.0000 [IU] | PEN_INJECTOR | Freq: Three times a day (TID) | SUBCUTANEOUS | 0 refills | Status: DC

## 2021-01-05 MED ORDER — CEFDINIR 300 MG PO CAPS: 300.0000 mg | ORAL_CAPSULE | Freq: Two times a day (BID) | ORAL | 0 refills | Status: DC

## 2021-01-05 MED ORDER — INSULIN ASPART 100 UNIT/ML FLEXPEN
8.0000 [IU] | PEN_INJECTOR | Freq: Three times a day (TID) | SUBCUTANEOUS | 0 refills | Status: DC
Start: 2021-01-05 — End: 2021-01-05

## 2021-01-05 MED ORDER — HYDRALAZINE HCL 10 MG PO TABS: 10.0000 mg | ORAL_TABLET | Freq: Three times a day (TID) | ORAL | 0 refills | Status: DC

## 2021-01-05 MED ORDER — AZITHROMYCIN 500 MG PO TABS: 500.0000 mg | ORAL_TABLET | Freq: Every day | ORAL | 0 refills | Status: DC

## 2021-01-05 MED ORDER — NICOTINE 21 MG/24HR TD PT24: 21.0000 mg | MEDICATED_PATCH | Freq: Every day | TRANSDERMAL | 0 refills | Status: DC

## 2021-01-05 MED ORDER — INSULIN GLARGINE 100 UNIT/ML SOLOSTAR PEN: 20.0000 [IU] | PEN_INJECTOR | Freq: Every day | SUBCUTANEOUS | 0 refills | Status: DC

## 2021-01-05 MED ORDER — INFLUENZA VAC SPLIT QUAD 0.5 ML IM SUSY
0.5000 mL | PREFILLED_SYRINGE | INTRAMUSCULAR | Status: AC | PRN
  Administered 2021-01-05: 0.5 mL via INTRAMUSCULAR
  Filled 2021-01-05: qty 0.5

## 2021-01-05 MED FILL — AZITHROMYCIN 500 MG TABLET: 500 | 2 days supply | Qty: 2 | Fill #0

## 2021-01-05 MED FILL — PENTIPS 32G X 4 MM MISC: 32G X 4 MM | 25 days supply | Qty: 100 | Fill #0

## 2021-01-05 MED FILL — HUMALOG 100 UNITS/ML KWIKPE: 100 | 25 days supply | Qty: 6 | Fill #0

## 2021-01-05 MED FILL — LANTUS SOLOSTAR 100 UNITS/M: 100 | 30 days supply | Qty: 6 | Fill #0

## 2021-01-05 MED FILL — predniSONE 20 MG TABS: 20 | 5 days supply | Qty: 10 | Fill #0

## 2021-01-05 MED FILL — CEFDINIR 300 MG CAPSULE: 300 | 3 days supply | Qty: 6 | Fill #0

## 2021-01-05 MED FILL — hydrALAZINE HCL 10 MG TABS: 10 | 30 days supply | Qty: 90 | Fill #0

## 2021-01-05 NOTE — Discharge Summary (Addendum)
Physician Discharge Summary  [name] [id number] DOB: [birth year] DOA: 01/01/2021  PCP: Loretha Brasil, FNP (Inactive)  Admit date: 01/01/2021 Discharge date: 01/05/2021  Time spent: 30 minutes  Recommendations for Outpatient Follow-up:   Paroxysmal atrial fibrillation -Amiodarone 1 mg daily -Norvasc 10 mg daily -Eliquis 5 mg BID -Bisoprolol 5 mg daily -Spironolactone 25 mg daily (hold) worsening renal failure -Torsemide 20 mg BID (hold) worsening renal failure -2/21 hydralazine 10 mg TID -2/22 follow-up with PCP in 1 to 2 weeks decide when/if to restart spironolactone/torsemide.  Currently would not restart given patient's improved renal status and stable paroxysmal atrial fibrillation.  PCP to adjust diabetic medication.   CAD/PVD -See atrial fibrillation  Essential HTN -See A. fib  Acute respiratory failure with hypoxia/COPD exacerbation -Solu-Medrol 60 mg bid (DC on 2/21) -DuoNeb QID -Incentive spirometry -Flutter valve -Continuous pulse ox -2/21 Prednisone 40 mg -Patient meets criteria for home O2 Patient Saturations on Room Air at Rest =96% Patient Saturations on Room Air while Ambulating =87% Patient Saturations on0Liters of oxygen while Ambulating = 0% Please briefly explain why patient needs home oxygen: O2 sats dropped to 87% only when walking but went back to 94% after sitting at room air. -2 L O2 titrate to maintain SPO2> 93% -Provide Inogen portable home O2 concentrator  Tobacco abuse -Counseled at length concerning the fact that she has significant COPD and should never smoke again. 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 -2/19 hemoglobin A1c= 8.2 -NovoLog NovoLog 8 units qac -2/22 Lantus 20 units daily   Discharge Diagnoses:  Active Problems:   DM (diabetes mellitus), type 2 with peripheral vascular complications ([name])   History of cocaine abuse ([name])   Hypothyroidism   S/P peripheral artery angioplasty - TurboHawk atherectomy; R SFA   Claudication ([name])   PAF (paroxysmal atrial fibrillation) ([name])   COPD GOLD 0    CKD (chronic kidney disease), stage III ([name])   AKI (acute kidney injury) ([name])   PAD (peripheral artery disease) ([name])   Altered mental status   AF (paroxysmal atrial fibrillation) ([name])   CAD (coronary artery disease)   PVD (peripheral vascular disease) ([name])   Acute renal failure superimposed on stage 4 chronic kidney disease ([name])   Diabetes mellitus type 2, uncontrolled, with complications ([place])   Diabetic nephropathy ([place])   Discharge Condition: Stable  Diet recommendation: Heart healthy/carb modified  Filed Weights   01/01/21 1718 01/03/21 0500  Weight: 103.4 kg 103.5 kg    History of present illness:  [name] PMHx Depression, Anxiety,, Polysubstance abuse (EtOH abuse, cocaine abuse, narcotic abuse, marijuana use),  type II uncontrolled with peripheral neuropathy, Paroxysmal atrial fibrillation anticoagulated on Eliquis  HPI/review of systems unable to be obtained on initial interview with patient.  ED Course:Initially presented complaining of hallucinations. Ativan was provided prior to taking patient down for MRI, on arrival to MRI patient was quite somnolent, MRI had to be canceled. Patient was arousable but not conversant, not following commands.Presumed Ativan adverse effect, repeat  labs showed no obvious other metabolic derangement, showed stable COPD no acute  change, kept for observation. UDS pending  Hospital Course:  See above  Procedures: 2/18 CXR; chronic interstitial scarring consistent with COPD 2/19 DG abdomen; nonobstructive bowel gas pattern with moderate colonic gas and stool 2/19 MRI brain; no acute findings.  Chronic small vessel disease   Cultures  2/19 SARS coronavirus negative 2/19 influenza A/B negative  Antibiotics Anti-infectives (From admission, onward)   Start     Ordered Stop   01/05/21 0900  cefixime (SUPRAX) capsule 400 mg        01/05/21 0800     01/05/21 0000  azithromycin (ZITHROMAX) 500 MG tablet        01/05/21 0819     01/05/21 0000  cefdinir (OMNICEF) 300 MG capsule        01/05/21 0819     01/04/21 1800  azithromycin (ZITHROMAX) tablet 500 mg        01/04/21 1300 01/08/21 1759   01/02/21 1845  azithromycin (ZITHROMAX) 500 mg in sodium chloride 0.9 % 250 mL IVPB  Status:  Discontinued        01/02/21 1657 01/04/21 1300   01/02/21 1800  cefTRIAXone (ROCEPHIN) 1 g in sodium chloride 0.9 % 100 mL IVPB  Status:  Discontinued        01/02/21 1657 01/05/21 0800        Discharge Exam: Vitals:   01/04/21 2115 01/05/21 0636 01/05/21 0720 01/05/21 0752  BP: 128/60 (!) 157/74 (!) 164/63   Pulse: 65 (!) 51 (!) 57   Resp: '20 18 16   '$ Temp: 98.4 F (36.9 C) 98 F (36.7 C) 98.7 F (37.1 C)   TempSrc: Oral  Oral   SpO2: 98% 93% 97% 96%  Weight:      Height:        General: A/O x4, No acute respiratory distress Eyes: negative scleral hemorrhage, negative anisocoria, negative icterus ENT: Negative Runny nose, negative gingival bleeding, Neck:  Negative scars, masses, torticollis, lymphadenopathy, JVD Lungs: decreased breath sounds bilaterally (improved from 2/19) without wheezes or crackles Cardiovascular: Regular rate and rhythm without murmur gallop or rub normal S1 and S2 Abdomen: OBESE, negative abdominal pain, nondistended, positive soft, bowel sounds, no rebound, no ascites, no appreciable  mass  Discharge Instructions   Allergies as of 01/05/2021      Reactions   Gabapentin Nausea And Vomiting, Other (See Comments)   POSSIBLE SHAKING? TAKING MED CURRENTLY   Lyrica [pregabalin] Other (See Comments)   Shaking Pt still taking lyrica--"probably will stop taking"       Medication List    STOP taking these medications   empagliflozin 10 MG Tabs tablet Commonly known as: Jardiance   empagliflozin 25 MG Tabs tablet Commonly known as: JARDIANCE   HumaLOG KwikPen 200 UNIT/ML KwikPen Generic drug: insulin lispro   nortriptyline 50 MG capsule Commonly known as: PAMELOR   spironolactone 25 MG tablet Commonly known as: ALDACTONE   torsemide 20 MG tablet Commonly known as: DEMADEX     TAKE these medications   Accu-Chek Guide test strip Generic drug: glucose blood USE TO CHECK BLOOD SUGAR FOUR TIMES DAILY   albuterol 108 (90 Base) MCG/ACT inhaler Commonly known as: VENTOLIN HFA Inhale 2 puffs into the lungs every 6 (six) hours as needed for wheezing or shortness of breath.   allopurinol 100 MG tablet Commonly known as: ZYLOPRIM Take 100 mg by mouth daily.   amiodarone 200 MG tablet Commonly known as: PACERONE  Take 0.5 tablets (100 mg total) by mouth daily.   amLODipine 10 MG tablet Commonly known as: NORVASC Take 1 tablet (10 mg total) by mouth daily.   apixaban 5 MG Tabs tablet Commonly known as: Eliquis TAKE 1 TABLET(5 MG) BY MOUTH TWICE DAILY What changed:   how much to take  how to take this  when to take this  additional instructions   atorvastatin 40 MG tablet Commonly known as: LIPITOR Take 40 mg by mouth every evening.   azithromycin 500 MG tablet Commonly known as: ZITHROMAX Take 1 tablet (500 mg total) by mouth daily at 6 PM.   bisoprolol 5 MG tablet Commonly known as: ZEBETA Take 1 tablet (5 mg total) by mouth daily.   budesonide-formoterol 160-4.5 MCG/ACT inhaler Commonly known as: Symbicort Inhale 2 puffs into the lungs 2  (two) times daily.   cefdinir 300 MG capsule Commonly known as: OMNICEF Take 1 capsule (300 mg total) by mouth 2 (two) times daily.   cholecalciferol 1000 units tablet Commonly known as: VITAMIN D Take 1 tablet (1,000 Units total) by mouth daily.   clopidogrel 75 MG tablet Commonly known as: PLAVIX Take 1 tablet (75 mg total) by mouth daily.   colchicine 0.6 MG tablet Take 0.6 mg by mouth daily as needed (gout attacks).   folic acid 1 MG tablet Commonly known as: FOLVITE Take 1 tablet (1 mg total) by mouth daily.   hydrALAZINE 10 MG tablet Commonly known as: APRESOLINE Take 1 tablet (10 mg total) by mouth every 8 (eight) hours.   insulin aspart 100 UNIT/ML FlexPen Commonly known as: NOVOLOG Inject 8 Units into the skin 3 (three) times daily with meals.   insulin glargine 100 unit/mL Sopn Commonly known as: LANTUS Inject 20 Units into the skin daily. What changed:   medication strength  how much to take  when to take this   levothyroxine 50 MCG tablet Commonly known as: SYNTHROID Take 1 tablet (50 mcg total) by mouth daily.   losartan 100 MG tablet Commonly known as: COZAAR Take 1 tablet (100 mg total) by mouth daily.   nicotine 21 mg/24hr patch Commonly known as: NICODERM CQ - dosed in mg/24 hours Place 1 patch (21 mg total) onto the skin daily.   Omega-3 1000 MG Caps Take 1,000 mg by mouth 2 (two) times daily.   pantoprazole 40 MG tablet Commonly known as: PROTONIX Take 1 tablet (40 mg total) by mouth daily.   predniSONE 20 MG tablet Commonly known as: DELTASONE Take 2 tablets (40 mg total) by mouth daily with breakfast. Start taking on: January 06, 2021   Spiriva HandiHaler 18 MCG inhalation capsule Generic drug: tiotropium Place 1 capsule (18 mcg total) into inhaler and inhale every morning.            Durable Medical Equipment  (From admission, onward)         Start     Ordered   01/04/21 1648  For home use only DME oxygen  Once        Comments: Patient Saturations on Room Air at Rest = 96% Patient Saturations on Room Air while Ambulating = 87% Patient Saturations on 0 Liters of oxygen while Ambulating = 0% Please briefly explain why patient needs home oxygen:  O2 sats dropped to 87% only when walking but went back to 94% after sitting at room air. -2 L O2 titrate to maintain SPO2> 93% -Provide Inogen portable home O2 concentrator  Question Answer Comment  Length  of Need Lifetime   Mode or (Route) Nasal cannula   Liters per Minute 2   Frequency Continuous (stationary and portable oxygen unit needed)   Oxygen conserving device Yes   Oxygen delivery system Gas      01/04/21 1648         Allergies  Allergen Reactions  . Gabapentin Nausea And Vomiting and Other (See Comments)    POSSIBLE SHAKING? TAKING MED CURRENTLY  . Lyrica [Pregabalin] Other (See Comments)    Shaking Pt still taking lyrica--"probably will stop taking"       The results of significant diagnostics from this hospitalization (including imaging, microbiology, ancillary and laboratory) are listed below for reference.    Significant Diagnostic Studies: DG Chest 1 View  Result Date: 01/01/2021 CLINICAL DATA:  Altered level of consciousness, wheezing EXAM: CHEST  1 VIEW COMPARISON:  12/01/2019 FINDINGS: Single frontal view of the chest demonstrates a stable cardiac silhouette. No airspace disease, effusion, or pneumothorax. Chronic interstitial prominence consistent with known history of COPD. IMPRESSION: 1. No acute intrathoracic process. 2. Chronic interstitial scarring compatible with known history of COPD. Electronically Signed   By: Randa Ngo M.D.   On: 01/01/2021 23:44   DG Abd 1 View  Result Date: 01/02/2021 CLINICAL DATA:  Change in mental status EXAM: ABDOMEN - 1 VIEW COMPARISON:  None recent FINDINGS: Gas throughout the colon with some stool seen proximally. No obstructive pattern. No small bowel dilatation. No concerning mass effect  or stone like calcification. Atheromatous calcifications are noted at the pelvis and left upper quadrant. IMPRESSION: Nonobstructive bowel gas pattern with moderate colonic gas and stool. Electronically Signed   By: Monte Fantasia M.D.   On: 01/02/2021 05:30   CT Head Wo Contrast  Result Date: 01/01/2021 CLINICAL DATA:  Altered mental status. EXAM: CT HEAD WITHOUT CONTRAST TECHNIQUE: Contiguous axial images were obtained from the base of the skull through the vertex without intravenous contrast. COMPARISON:  January 01, 2021 (2:00 p.m.) FINDINGS: Brain: No evidence of acute infarction, hemorrhage, hydrocephalus, extra-axial collection or mass lesion/mass effect. 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 Vascular: No hyperdense vessel or unexpected calcification. Skull: Normal. Negative for fracture or focal lesion. Sinuses/Orbits: No acute finding. Other: None. IMPRESSION: 1. No acute intracranial abnormality. 2. 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 Electronically Signed   By: Virgina Norfolk M.D.   On: 01/01/2021 23:43   CT Head Wo Contrast  Result Date: 01/01/2021 CLINICAL DATA:  Psychotic symptoms for 3 days. EXAM: CT HEAD WITHOUT CONTRAST TECHNIQUE: Contiguous axial images were obtained from the base of the skull through the vertex without intravenous contrast. COMPARISON:  Head CT 09/29/2018. FINDINGS: Brain: No evidence of acute infarction, hemorrhage, hydrocephalus, extra-axial collection or mass lesion/mass effect. 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 Vascular: No hyperdense vessel or unexpected calcification. Skull: Intact.  No focal lesion. Sinuses/Orbits: Status post cataract surgery.  Otherwise negative. Other: None. IMPRESSION: No acute abnormality. 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 Electronically Signed   By: Inge Rise M.D.   On: 01/01/2021 14:12   MR BRAIN WO  CONTRAST  Result Date: 01/02/2021 CLINICAL DATA:  Psychosis with mental status change. EXAM: MRI HEAD WITHOUT CONTRAST TECHNIQUE: Multiplanar, multiecho pulse sequences of the brain and surrounding structures were obtained without intravenous contrast. COMPARISON:  Head CT from yesterday FINDINGS: Brain: Remote cortically based infarcts along the high right frontal parietal convexity, distal MCA to watershed territory. Chronic small  vessel ischemia in the cerebral white matter and pons. White matter changes are greater on the right. Increased FLAIR signal within right-sided MCA branches. This is attributed to ICA findings described below. No acute hemorrhage, acute infarct, hydrocephalus, mass, or collection. Vascular: Loss of right carotid flow void in the neck on cavernous segment with reconstitution by the supraclinoid segment. Skull and upper cervical spine: Normal marrow signal Sinuses/Orbits: Bilateral cataract resection. Other: Significant motion degradation with truncated study. Axial T1, gradient, and coronal T2 weighted images were not acquired. IMPRESSION: 1. No acute finding. 2. Slow or absent flow in the right cervical carotid with remote right cerebral infarcts. 3. Motion degraded study which was truncated. 4. Chronic small vessel disease. Electronically Signed   By: Monte Fantasia M.D.   On: 01/02/2021 07:37    Microbiology: Recent Results (from the past 240 hour(s))  Resp Panel by RT-PCR (Flu A&B, Covid) Nasopharyngeal Swab     Status: None   Collection Time: 01/02/21  1:16 AM   Specimen: Nasopharyngeal Swab; Nasopharyngeal(NP) swabs in vial transport medium  Result Value Ref Range Status   SARS Coronavirus 2 by RT PCR NEGATIVE NEGATIVE Final    Comment: (NOTE) SARS-CoV-2 target nucleic acids are NOT DETECTED.  The SARS-CoV-2 RNA is generally detectable in upper respiratory specimens during the acute phase of infection. The lowest concentration of SARS-CoV-2 viral copies this assay  can detect is 138 copies/mL. A negative result does not preclude SARS-Cov-2 infection and should not be used as the sole basis for treatment or other patient management decisions. A negative result may occur with  improper specimen collection/handling, submission of specimen other than nasopharyngeal swab, presence of viral mutation(s) within the areas targeted by this assay, and inadequate number of viral copies(<138 copies/mL). A negative result must be combined with clinical observations, patient history, and epidemiological information. The expected result is Negative.  Fact Sheet for Patients:  EntrepreneurPulse.com.au  Fact Sheet for Healthcare Providers:  IncredibleEmployment.be  This test is no t yet approved or cleared by the [place] FDA and  has been authorized for detection and/or diagnosis of SARS-CoV-2 by FDA under an Emergency Use Authorization (EUA). This EUA will remain  in effect (meaning this test can be used) for the duration of the COVID-19 declaration under Section 564(b)(1) of the Act, 21 U.S.C.section 360bbb-3(b)(1), unless the authorization is terminated  or revoked sooner.       Influenza A by PCR NEGATIVE NEGATIVE Final   Influenza B by PCR NEGATIVE NEGATIVE Final    Comment: (NOTE) The Xpert Xpress SARS-CoV-2/FLU/RSV plus assay is intended as an aid in the diagnosis of influenza from Nasopharyngeal swab specimens and should not be used as a sole basis for treatment. Nasal washings and aspirates are unacceptable for Xpert Xpress SARS-CoV-2/FLU/RSV testing.  Fact Sheet for Patients: EntrepreneurPulse.com.au  Fact Sheet for Healthcare Providers: IncredibleEmployment.be  This test is not yet approved or cleared by the [place] FDA and has been authorized for detection and/or diagnosis of SARS-CoV-2 by FDA under an Emergency Use Authorization (EUA). This EUA will  remain in effect (meaning this test can be used) for the duration of the COVID-19 declaration under Section 564(b)(1) of the Act, 21 U.S.C. section 360bbb-3(b)(1), unless the authorization is terminated or revoked.  Performed at [facility], [place] [address] [place], [place] [address]      Labs: Basic Metabolic Panel: Recent Labs  Lab 01/01/21 1230 01/01/21 2054 01/02/21 [id number] 01/02/21 1055 01/03/21 1209 01/04/21 0602 01/05/21 0308  NA 139 140  140 140 141 136 136 137  K 5.5* 5.5*  5.5* 4.3 4.4 4.4 4.3 4.3  CL 108 114* 108  --  101 103 108  CO2 18*  --  17*  --  21* 22 21*  GLUCOSE 87 68* 125*  --  292* 215* 223*  BUN 64* 80* 62*  --  59* 60* 55*  CREATININE 2.93* 2.90* 2.58*  --  2.05* 1.94* 1.57*  CALCIUM 8.4*  --  8.5*  --  9.1 9.1 8.4*  MG  --   --   --   --  2.3 2.3 2.3  PHOS  --   --   --   --  4.0 3.3 3.6   Liver Function Tests: Recent Labs  Lab 01/01/21 1230 01/02/21 0607 01/03/21 1209 01/04/21 0602 01/05/21 0308  AST 57* 87* 59* 32 26  ALT 79* 103* 93* 68* 51*  ALKPHOS 168* 160* 161* 150* 124  BILITOT 0.7 0.3 <0.1* 0.4 0.2*  PROT 6.9 7.0 7.1 6.8 5.9*  ALBUMIN 3.5 3.4* 3.5 3.3* 3.0*   No results for input(s): LIPASE, AMYLASE in the last 168 hours. Recent Labs  Lab 01/01/21 1350 01/02/21 0116  AMMONIA 37* 30   CBC: Recent Labs  Lab 01/01/21 1230 01/01/21 2054 01/02/21 0607 01/02/21 1055 01/03/21 1209 01/04/21 0602 01/05/21 0308  WBC 9.2  --  10.9*  --  9.9 12.6* 8.5  NEUTROABS  --   --   --   --  8.7* 10.5* 5.6  HGB 12.2   < > 12.4 11.2* 12.3 12.0 11.5*  HCT 39.2   < > 41.9 33.0* 40.7 38.0 35.5*  MCV 97.0  --  98.8  --  96.2 94.5 94.9  PLT 271  --  264  --  291 267 241   < > = values in this interval not displayed.   Cardiac Enzymes: No results for input(s): CKTOTAL, CKMB, CKMBINDEX, TROPONINI in the last 168 hours. BNP: BNP (last 3 results) Recent Labs    09/10/20 1219  BNP 178.7*    ProBNP (last 3 results) No results  for input(s): PROBNP in the last 8760 hours.  CBG: Recent Labs  Lab 01/04/21 0802 01/04/21 1222 01/04/21 1655 01/04/21 2121 01/05/21 0729  GLUCAP 203* 285* 158* 137* 164*       Signed:  Dia Crawford, MD [facility]

## 2021-01-13 ENCOUNTER — Other Ambulatory Visit (HOSPITAL_COMMUNITY): Payer: Self-pay | Admitting: Cardiology

## 2021-01-16 ENCOUNTER — Encounter (HOSPITAL_COMMUNITY): Payer: Self-pay | Admitting: Emergency Medicine

## 2021-01-16 ENCOUNTER — Other Ambulatory Visit: Payer: Self-pay

## 2021-01-16 ENCOUNTER — Ambulatory Visit (HOSPITAL_COMMUNITY)
Admission: EM | Admit: 2021-01-16 | Discharge: 2021-01-16 | Disposition: A | Discharge status: Home / Self Care | Payer: Medicare Other | Attending: Urgent Care | Admitting: Urgent Care

## 2021-01-16 ENCOUNTER — Ambulatory Visit (INDEPENDENT_AMBULATORY_CARE_PROVIDER_SITE_OTHER): Payer: Medicare Other

## 2021-01-16 DIAGNOSIS — L03115 Cellulitis of right lower limb: Secondary | ICD-10-CM

## 2021-01-16 DIAGNOSIS — R600 Localized edema: Secondary | ICD-10-CM

## 2021-01-16 DIAGNOSIS — M79671 Pain in right foot: Secondary | ICD-10-CM

## 2021-01-16 DIAGNOSIS — S91105A Unspecified open wound of left lesser toe(s) without damage to nail, initial encounter: Secondary | ICD-10-CM | POA: Diagnosis not present

## 2021-01-16 MED ORDER — CEPHALEXIN 500 MG PO CAPS: 500.0000 mg | ORAL_CAPSULE | Freq: Three times a day (TID) | ORAL | 0 refills | Status: DC

## 2021-01-16 NOTE — ED Triage Notes (Signed)
Pt states that when she was d/c from the hospital that she was instructed to stop taking her duirectic. Pt states that she noticed swelling in her legs and feet last week. Pt states that she feels leg pain and a large blister on right leg. Pt states that she also has blisters on the right foot. Pt states that she was in instructed by her nephrologist to continue her water pills, she took torsemide last night

## 2021-01-16 NOTE — ED Provider Notes (Signed)
[place]   MRN: [id number] DOB: 1961/09/10  Subjective:   [name] is a 60 y.o. female with pmh of chronic congestive heart failure, nonischemic cardiomyopathy, CAD, peripheral vascular disease, diabetes, right midfoot amputation, left toe amputation presenting for 5 day history of acute onset recurrent lower leg swelling, weeping. Has also had left 4th toe wound that she wants to be evaluated. Has also had weeping from the bottom of her feet.  Denies chest pain, shortness of breath, fever, nausea, vomiting, dyspnea.  Of note, patient was hospitalized for acute respiratory failure with hypoxia/COPD exacerbation 01/04/2021-01/05/2021. Has seen nephrologist since then.   No current facility-administered medications for this encounter.  Current Outpatient Medications:  .  ACCU-CHEK GUIDE test strip, USE TO CHECK BLOOD SUGAR FOUR TIMES DAILY, Disp: , Rfl:  .  albuterol (VENTOLIN HFA) 108 (90 Base) MCG/ACT inhaler, Inhale 2 puffs into the lungs every 6 (six) hours as needed for wheezing or shortness of breath., Disp: 18 g, Rfl: 0 .  allopurinol (ZYLOPRIM) 100 MG tablet, Take 100 mg by mouth daily., Disp: , Rfl:  .  amiodarone (PACERONE) 200 MG tablet, Take 0.5 tablets (100 mg total) by mouth daily., Disp: 45 tablet, Rfl: 3 .  amLODipine (NORVASC) 10 MG tablet, Take 1 tablet (10 mg total) by mouth daily., Disp: 90 tablet, Rfl: 3 .  apixaban (ELIQUIS) 5 MG TABS tablet, TAKE 1 TABLET(5 MG) BY MOUTH TWICE DAILY (Patient taking differently: Take 5 mg by mouth 2 (two) times daily.), Disp: 180 tablet, Rfl: 3 .  atorvastatin (LIPITOR) 40 MG tablet, Take 40 mg by mouth every evening., Disp: , Rfl:  .  azithromycin (ZITHROMAX) 500 MG tablet, Take 1 tablet (500 mg total) by mouth daily at 6 PM., Disp: 2 tablet, Rfl: 0 .  bisoprolol (ZEBETA) 5 MG tablet, Take 1 tablet (5 mg total) by mouth daily., Disp: 90 tablet, Rfl: 3 .  budesonide-formoterol (SYMBICORT) 160-4.5 MCG/ACT inhaler,  Inhale 2 puffs into the lungs 2 (two) times daily., Disp: 1 Inhaler, Rfl: 2 .  cefdinir (OMNICEF) 300 MG capsule, Take 1 capsule (300 mg total) by mouth 2 (two) times daily., Disp: 6 capsule, Rfl: 0 .  cefdinir (OMNICEF) 300 MG capsule, Take 1 capsule (300 mg total) by mouth every 12 (twelve) hours., Disp: 6 capsule, Rfl: 0 .  cholecalciferol (VITAMIN D) 1000 units tablet, Take 1 tablet (1,000 Units total) by mouth daily., Disp:  , Rfl:  .  clopidogrel (PLAVIX) 75 MG tablet, Take 1 tablet (75 mg total) by mouth daily., Disp: 90 tablet, Rfl: 3 .  colchicine 0.6 MG tablet, Take 0.6 mg by mouth daily as needed (gout attacks)., Disp: , Rfl:  .  folic acid (FOLVITE) 1 MG tablet, Take 1 tablet (1 mg total) by mouth daily., Disp: 30 tablet, Rfl: 0 .  hydrALAZINE (APRESOLINE) 10 MG tablet, Take 1 tablet (10 mg total) by mouth every 8 (eight) hours., Disp: 90 tablet, Rfl: 0 .  insulin glargine (LANTUS) 100 UNIT/ML Solostar Pen, Inject 20 Units into the skin daily., Disp: 15 mL, Rfl: 0 .  insulin lispro (HUMALOG) 100 UNIT/ML KwikPen, Inject 8 Units into the skin 3 (three) times daily with meals., Disp: 15 mL, Rfl: 0 .  levothyroxine (SYNTHROID, LEVOTHROID) 50 MCG tablet, Take 1 tablet (50 mcg total) by mouth daily., Disp: 30 tablet, Rfl: 0 .  losartan (COZAAR) 100 MG tablet, Take 1 tablet (100 mg total) by mouth daily., Disp: 90 tablet, Rfl: 3 .  nicotine (NICODERM CQ - DOSED IN MG/24 HOURS) 21 mg/24hr patch, Place 1 patch (21 mg total) onto the skin daily., Disp: 28 patch, Rfl: 0 .  Omega-3 1000 MG CAPS, Take 1,000 mg by mouth 2 (two) times daily., Disp: , Rfl:  .  pantoprazole (PROTONIX) 40 MG tablet, Take 1 tablet (40 mg total) by mouth daily., Disp:  , Rfl:  .  predniSONE (DELTASONE) 20 MG tablet, Take 2 tablets (40 mg total) by mouth daily with breakfast., Disp: 10 tablet, Rfl: 0 .  tiotropium (SPIRIVA HANDIHALER) 18 MCG inhalation capsule, Place 1 capsule (18 mcg total) into inhaler and inhale every  morning., Disp: 30 capsule, Rfl: 2 .  torsemide (DEMADEX) 20 MG tablet, TAKE 3 TABLETS BY MOUTH IN  THE MORNING AND 2 TABLETS  BY MOUTH IN THE EVENING, Disp: 450 tablet, Rfl: 3   Allergies  Allergen Reactions  . Gabapentin Nausea And Vomiting and Other (See Comments)    POSSIBLE SHAKING? TAKING MED CURRENTLY  . Lyrica [Pregabalin] Other (See Comments)    Shaking Pt still taking lyrica--"probably will stop taking"     Past Medical History:  Diagnosis Date  . Alcohol abuse   . Alcoholic cirrhosis ([place])   . Anemia   . Anxiety   . Arthritis    "knees" (11/26/2018)  . B12 deficiency   . CAD (coronary artery disease)    a. 11/10/2014 Cath: LM nl, LAD min irregs, D1 30 ost, D2 50d, LCX 61m OM1 80 p/m (1.5 mm vessel), OM2 464mRCA nondom 9041mmed rx.. Demand ischemia in the setting of rapid a-fib.  . Cardiomyopathy ([facility] . Carotid artery disease ([place]  a. 01/2015 Carotid Angio: RICA [id number]. [id number] L CEA; c. 05/2019 Carotid U/S: RICA 100. RECA >50. LICA [id number]. Cellulitis    lower extremities  . Chronic combined systolic and diastolic CHF (congestive heart failure) ([facility]  a. 10/2014 Echo: EF 40-45%; b. 10/2018 Echo: EF 45-50%, gr2 DD; c. 11/2019 Echo: EF 50%, mild LVH, gr2 DD (restrictive), antlat HK, Nl RV fxn. Mild BAE. RVSP 59.9mm31m  . CKD (chronic kidney disease), stage III ([name])[place]. Cocaine abuse ([name])[place]. COPD (chronic obstructive pulmonary disease) ([name])[place]. Critical lower limb ischemia ([name])[place]. Depression   . Diabetic peripheral neuropathy ([name])[place]. Dyspnea   . Elevated troponin    a. Chronic elevation.  . GEMarland KitchenD (gastroesophageal reflux disease)   . Hyperlipemia   . Hypertension   . Hypokalemia   . Hypomagnesemia   . Hypothyroidism   . Marijuana abuse   . Narcotic abuse ([name])[place]. Noncompliance   . NSVT (nonsustained ventricular tachycardia) ([name])[place]. Obesity   . PAF (paroxysmal atrial fibrillation) ([name])[place]. Paroxysmal atrial tachycardia ([name])[place] . Peripheral arterial disease ([name])[place] a. 01/2015 Angio/PTA: RSFA 99 (atherectomy/pta) - 1 vessel runoff via diff dzs peroneal; b. 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  . Pneumonia    "once or twice" (11/26/2018)  . Poorly controlled type 2 diabetes mellitus ([name])[place]. Renal insufficiency    a. 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  . SlMarland Kitchenep apnea    "couldn't handle wearing the mask" (11/26/2018)  . Symptomatic bradycardia    a. Avoid AV blocking agent per EP. Prev req temp wire in 2017.  . Tobacco abuse  Past Surgical History:  Procedure Laterality Date  . ABDOMINAL AORTOGRAM N/A 06/26/2019   Procedure: ABDOMINAL AORTOGRAM;  Surgeon: Wellington Hampshire, MD;  Location: [facility];  Service: Cardiovascular;  Laterality: N/A;  . AMPUTATION Right 06/14/2017   Procedure: Right foot transmetatarsal amputation;  Surgeon: Newt Minion, MD;  Location: [facility];  Service: Orthopedics;  Laterality: Right;  . AMPUTATION Left 06/28/2019   Procedure: AMPUTATION LEFT FIFTH TOE;  Surgeon: Rosetta Posner, MD;  Location: [facility];  Service: Vascular;  Laterality: Left;  . AMPUTATION TOE Right 04/28/2017   Procedure: AMPUTATION OF RIGHT SECOND RAY;  Surgeon: Newt Minion, MD;  Location: [facility];  Service: Orthopedics;  Laterality: Right;  . CARDIAC CATHETERIZATION    . CARDIAC CATHETERIZATION N/A 01/12/2016   Procedure: Temporary Wire;  Surgeon: Minus Breeding, MD;  Location: [facility];  Service: Cardiovascular;  Laterality: N/A;  . CARDIOVERSION  ~ 02/2013   "twice"   . CAROTID ANGIOGRAM N/A 01/15/2015   Procedure: CAROTID ANGIOGRAM;  Surgeon: [name], MD;  Location: [facility] CATH LAB;  Service: Cardiovascular;  Laterality: N/A;  . DILATION AND CURETTAGE OF UTERUS  1988  . ENDARTERECTOMY Left 02/19/2015   Procedure: LEFT CAROTID ENDARTERECTOMY ;  Surgeon: Serafina Mitchell, MD;  Location: [facility];  Service: Vascular;  Laterality: Left;  . FEMORAL-TIBIAL BYPASS GRAFT Left 06/28/2019    Procedure: BYPASS GRAFT LEFT LEG FEMORAL TO ANTERIOR TIBIAL ARTERY using LEFT GREATER SAPHENOUS VEIN;  Surgeon: Rosetta Posner, MD;  Location: [facility];  Service: Vascular;  Laterality: Left;  . LEFT HEART CATHETERIZATION WITH CORONARY ANGIOGRAM N/A 10/31/2014   Procedure: LEFT HEART CATHETERIZATION WITH CORONARY ANGIOGRAM;  Surgeon: Burnell Blanks, MD;  Location: [facility] CATH LAB;  Service: Cardiovascular;  Laterality: N/A;  . LOWER EXTREMITY ANGIOGRAM N/A 09/10/2013   Procedure: LOWER EXTREMITY ANGIOGRAM;  Surgeon: [name], MD;  Location: [facility];  Service: Cardiovascular;  Laterality: N/A;  . LOWER EXTREMITY ANGIOGRAM N/A 01/15/2015   Procedure: LOWER EXTREMITY ANGIOGRAM;  Surgeon: [name], MD;  Location: [facility] CATH LAB;  Service: Cardiovascular;  Laterality: N/A;  . LOWER EXTREMITY ANGIOGRAPHY N/A 04/13/2017   Procedure: Lower Extremity Angiography;  Surgeon: [name], MD;  Location: [facility];  Service: Cardiovascular;  Laterality: N/A;  . LOWER EXTREMITY ANGIOGRAPHY Left 06/26/2019   Procedure: LOWER EXTREMITY ANGIOGRAPHY;  Surgeon: Wellington Hampshire, MD;  Location: [facility];  Service: Cardiovascular;  Laterality: Left;  . PERIPHERAL VASCULAR BALLOON ANGIOPLASTY Left 06/26/2019   Procedure: PERIPHERAL VASCULAR BALLOON ANGIOPLASTY;  Surgeon: Wellington Hampshire, MD;  Location: [facility];  Service: Cardiovascular;  Laterality: Left;  unable to cross lt sfa occlusion  . PERIPHERAL VASCULAR INTERVENTION  04/13/2017   Procedure: Peripheral Vascular Intervention;  Surgeon: [name], MD;  Location: [facility];  Service: Cardiovascular;;  . PRESSURE SENSOR/CARDIOMEMS N/A 02/05/2020   Procedure: PRESSURE SENSOR/CARDIOMEMS;  Surgeon: Larey Dresser, MD;  Location: [facility];  Service: Cardiovascular;  Laterality: N/A;  . VEIN HARVEST Left 06/28/2019   Procedure: LEFT LEG GREATER SAPHENOUS VEIN HARVEST;  Surgeon: Rosetta Posner, MD;   Location: [facility];  Service: Vascular;  Laterality: Left;    Family History  Problem Relation Age of Onset  . Hypertension Mother   . Diabetes Mother   . Cancer Mother        breast, ovarian, colon  . Clotting disorder Mother   .  Heart disease Mother   . Heart attack Mother   . Breast cancer Mother        in 75's  . Hypertension Father   . Heart disease Father   . Emphysema Sister        smoked    Social History   Tobacco Use  . Smoking status: Current Some Day Smoker    Packs/day: 1.00    Years: 44.00    Pack years: 44.00    Types: E-cigarettes, Cigarettes  . Smokeless tobacco: Former Systems developer    Types: Snuff  Vaping Use  . Vaping Use: Former  . Devices: 11/26/2018 "stopped months ago"  Substance Use Topics  . Alcohol use: Yes    Comment: occ  . Drug use: Yes    Types: "Crack" cocaine, Marijuana, Oxycodone    ROS   Objective:   Vitals: BP (!) 154/78 (BP Location: Right Arm)   Pulse 61   Temp (!) 97.3 F (36.3 C) (Temporal)   Resp 16   LMP 11/27/2012   SpO2 100%   Physical Exam Constitutional:      General: She is not in acute distress.    Appearance: Normal appearance. She is well-developed. She is not ill-appearing, toxic-appearing or diaphoretic.  HENT:     Head: Normocephalic and atraumatic.     Nose: Nose normal.     Mouth/Throat:     Mouth: Mucous membranes are moist.  Eyes:     Extraocular Movements: Extraocular movements intact.     Pupils: Pupils are equal, round, and reactive to light.  Cardiovascular:     Rate and Rhythm: Normal rate and regular rhythm.     Pulses: Normal pulses.     Heart sounds: Normal heart sounds. No murmur heard. No friction rub. No gallop.   Pulmonary:     Effort: Pulmonary effort is normal. No respiratory distress.     Breath sounds: No stridor. Rhonchi (coarse lung sounds) present. No wheezing or rales.  Musculoskeletal:     Right lower leg: Tenderness present. Edema (1+ up to mid leg) present.     Left lower  leg: Tenderness present. Edema (1+ up to mid leg) present.       Legs:  Skin:    General: Skin is warm and dry.     Findings: No rash.  Neurological:     Mental Status: She is alert and oriented to person, place, and time.  Psychiatric:        Mood and Affect: Mood normal.        Behavior: Behavior normal.        Thought Content: Thought content normal.        Judgment: Judgment normal.     DG Foot Complete Right  Result Date: 01/16/2021 CLINICAL DATA:  Right foot pain. EXAM: RIGHT FOOT COMPLETE - 3+ VIEW COMPARISON:  Preoperative radiograph 06/13/2017 FINDINGS: Interval transmetatarsal amputation of all 5 rays. Resection margins are smooth. No fracture, erosion, or bony destruction. Bones are subjectively under mineralized. Moderate plantar calcaneal spur. Prominent vascular calcifications. Mild dorsal soft tissue edema. No soft tissue air or radiopaque foreign body. IMPRESSION: 1. Mild dorsal soft tissue edema. No soft tissue air or radiopaque foreign body. 2. Transmetatarsal amputation of all 5 rays. No evidence of osteomyelitis or acute osseous abnormality. Electronically Signed   By: Keith Rake M.D.   On: 01/16/2021 16:17   DG Toe 4th Left  Result Date: 01/16/2021 CLINICAL DATA:  Left toe wound. EXAM: LEFT FOURTH TOE  COMPARISON:  None. FINDINGS: The site of fourth toe wound is not well demonstrated by radiograph. There is no evidence of bony destruction, erosion, fracture or periosteal reaction. Remote fifth transmetatarsal amputation. No tracking soft tissue air. No radiopaque foreign body. Lateral view limited by osseous overlap. IMPRESSION: No radiographic findings of osteomyelitis. The site of fourth toe wound is not well demonstrated by radiograph. Electronically Signed   By: Keith Rake M.D.   On: 01/16/2021 16:18    Assessment and Plan :   PDMP not reviewed this encounter.  1. Cellulitis of right lower extremity   2. Bilateral lower extremity edema     Modified  creatinine clearance calculated at 46 mL/min using the creatinine level from 01/05/2021.  We will start her on Keflex 3 times daily to address infectious process.  Counseled patient that she needs very close follow-up with her PCP.  Maintain strict ER precautions.  X-ray is reassuring against osteomyelitis.  Cardiopulmonary exam not concerning for CHF in the context of her peripheral edema.  Emphasized need for compliance with her torsemide as discussed with her nephrologist.  Legs wrapped using non-adherent and secured with a total of 4" Ace wraps. Counseled patient on potential for adverse effects with medications prescribed/recommended today, ER and return-to-clinic precautions discussed, patient verbalized understanding.    Jaynee Eagles, [place] 01/16/21 1651

## 2021-01-16 NOTE — Discharge Instructions (Addendum)
Please make absolutely sure that you are taking your torsemide. Start Keflex for cellulitis, infection of your lower legs. Follow up with your PCP this upcoming week. Do not use any nonsteroidal anti-inflammatories (NSAIDs) like ibuprofen, Motrin, naproxen, Aleve, etc. which are all available over-the-counter.  Please just use Tylenol at a dose of '500mg'$ -'650mg'$  once every 6 hours as needed for your aches, pains, fevers.

## 2021-01-18 ENCOUNTER — Telehealth (HOSPITAL_COMMUNITY): Payer: Self-pay | Admitting: *Deleted

## 2021-01-18 NOTE — Telephone Encounter (Signed)
Attempted to call pt to f/u, mobile number is not working and left another message on home number

## 2021-01-18 NOTE — Telephone Encounter (Signed)
Received refill request for pt's torsemide on Fri 3/4 pm, per chart med was d/c'd at discharge. Upon further review pt was advised to stop taking Jardiance, Humalog, nortriptyline, spironolactone, and Torsemide on 2/22 at hospital d/c. Attempted to call pt 3/4 PM to discuss but no answer, med refilled.  Attempted to call pt again this AM to discuss and Left message to call back, per chart pt was seen on Sat 3/4 for edema and restarted Torsemide

## 2021-01-22 ENCOUNTER — Telehealth (HOSPITAL_COMMUNITY): Payer: Self-pay | Admitting: Adult Health

## 2021-01-22 NOTE — Telephone Encounter (Signed)
° °  Cardiomems   Reading 24 Goal 20   Will need to increase to torsemide 60 mg twice a day .   I called no answer. We will continue to try and contact her.   Darrien Belter NP-C  3:31 PM

## 2021-01-22 NOTE — Telephone Encounter (Signed)
Attempted to reach pt by phone no answer.

## 2021-02-09 ENCOUNTER — Other Ambulatory Visit: Payer: Self-pay

## 2021-02-09 ENCOUNTER — Encounter (HOSPITAL_COMMUNITY): Payer: Self-pay

## 2021-02-09 ENCOUNTER — Emergency Department (HOSPITAL_COMMUNITY): Payer: Medicare Other

## 2021-02-09 ENCOUNTER — Emergency Department (HOSPITAL_COMMUNITY)
Admission: EM | Admit: 2021-02-09 | Discharge: 2021-02-09 | Disposition: A | Discharge status: Home / Self Care | Payer: Medicare Other | Attending: Emergency Medicine | Admitting: Emergency Medicine

## 2021-02-09 DIAGNOSIS — S81811A Laceration without foreign body, right lower leg, initial encounter: Secondary | ICD-10-CM | POA: Diagnosis not present

## 2021-02-09 DIAGNOSIS — I5042 Chronic combined systolic (congestive) and diastolic (congestive) heart failure: Secondary | ICD-10-CM | POA: Diagnosis not present

## 2021-02-09 DIAGNOSIS — E039 Hypothyroidism, unspecified: Secondary | ICD-10-CM | POA: Diagnosis not present

## 2021-02-09 DIAGNOSIS — W08XXXA Fall from other furniture, initial encounter: Secondary | ICD-10-CM | POA: Insufficient documentation

## 2021-02-09 DIAGNOSIS — W19XXXA Unspecified fall, initial encounter: Secondary | ICD-10-CM

## 2021-02-09 DIAGNOSIS — Z79899 Other long term (current) drug therapy: Secondary | ICD-10-CM | POA: Insufficient documentation

## 2021-02-09 DIAGNOSIS — Z794 Long term (current) use of insulin: Secondary | ICD-10-CM | POA: Insufficient documentation

## 2021-02-09 DIAGNOSIS — S62306A Unspecified fracture of fifth metacarpal bone, right hand, initial encounter for closed fracture: Secondary | ICD-10-CM | POA: Insufficient documentation

## 2021-02-09 DIAGNOSIS — I130 Hypertensive heart and chronic kidney disease with heart failure and stage 1 through stage 4 chronic kidney disease, or unspecified chronic kidney disease: Secondary | ICD-10-CM | POA: Diagnosis not present

## 2021-02-09 DIAGNOSIS — Z7901 Long term (current) use of anticoagulants: Secondary | ICD-10-CM | POA: Insufficient documentation

## 2021-02-09 DIAGNOSIS — E1122 Type 2 diabetes mellitus with diabetic chronic kidney disease: Secondary | ICD-10-CM | POA: Insufficient documentation

## 2021-02-09 DIAGNOSIS — J449 Chronic obstructive pulmonary disease, unspecified: Secondary | ICD-10-CM | POA: Insufficient documentation

## 2021-02-09 DIAGNOSIS — I2510 Atherosclerotic heart disease of native coronary artery without angina pectoris: Secondary | ICD-10-CM | POA: Insufficient documentation

## 2021-02-09 DIAGNOSIS — Y92009 Unspecified place in unspecified non-institutional (private) residence as the place of occurrence of the external cause: Secondary | ICD-10-CM | POA: Diagnosis not present

## 2021-02-09 DIAGNOSIS — Z7951 Long term (current) use of inhaled steroids: Secondary | ICD-10-CM | POA: Diagnosis not present

## 2021-02-09 DIAGNOSIS — F17210 Nicotine dependence, cigarettes, uncomplicated: Secondary | ICD-10-CM | POA: Insufficient documentation

## 2021-02-09 DIAGNOSIS — N184 Chronic kidney disease, stage 4 (severe): Secondary | ICD-10-CM | POA: Insufficient documentation

## 2021-02-09 DIAGNOSIS — S6991XA Unspecified injury of right wrist, hand and finger(s), initial encounter: Secondary | ICD-10-CM | POA: Diagnosis present

## 2021-02-09 MED ORDER — ONDANSETRON 4 MG PO TBDP
4.0000 mg | ORAL_TABLET | Freq: Once | ORAL | Status: AC
  Administered 2021-02-09: 4 mg via ORAL
  Filled 2021-02-09: qty 1

## 2021-02-09 MED ORDER — LIDOCAINE-EPINEPHRINE (PF) 2 %-1:200000 IJ SOLN
20.0000 mL | Freq: Once | INTRAMUSCULAR | Status: AC
  Administered 2021-02-09: 20 mL
  Filled 2021-02-09: qty 20

## 2021-02-09 MED ORDER — CEPHALEXIN 500 MG PO CAPS: 500.0000 mg | ORAL_CAPSULE | Freq: Three times a day (TID) | ORAL | 0 refills | Status: DC

## 2021-02-09 MED ORDER — CEPHALEXIN 500 MG PO CAPS
500.0000 mg | ORAL_CAPSULE | Freq: Once | ORAL | Status: AC
  Administered 2021-02-09: 500 mg via ORAL
  Filled 2021-02-09: qty 1

## 2021-02-09 NOTE — Discharge Instructions (Addendum)
You were seen in the emergency department today after a fall. Your x-rays show that you have a small nondisplaced fracture in your right fifth metacarpal which is a bone your right hand.  You were placed into a splint for this.  Please keep the splint intact clean and dry until you have followed up with hand surgery.  Your laceration to your right lower leg was closed with 5 stitches.  Please keep the area clean and dry for the next 24 hours, after 24 hours you can get it wet but do not soak it.  Please see attached wound care instructions.  We are sending you home with a prescription for Keflex, this is an antibiotic to try to help prevent infection to your wound.  We have prescribed you new medication(s) today. Discuss the medications prescribed today with your pharmacist as they can have adverse effects and interactions with your other medicines including over the counter and prescribed medications. Seek medical evaluation if you start to experience new or abnormal symptoms after taking one of these medicines, seek care immediately if you start to experience difficulty breathing, feeling of your throat closing, facial swelling, or rash as these could be indications of a more serious allergic reaction   Please call Dr. Brennan Bailey office, the hand surgeon, tomorrow morning to schedule an appointment for follow-up as soon as possible.  Please call your primary care provider to schedule follow-up appointment within the next 3 days for recheck of your wound as well as a general recheck after your fall.   Return to the ER for any new or worsening symptoms including but not limited to increased pain, fever, redness/swelling/drainage to your wound, chest pain, abdominal pain, blood in your urine or stool, numbness/tingling/weakness, discoloration of your fingers or foot, or any other concerns.

## 2021-02-09 NOTE — ED Provider Notes (Signed)
[facility] DEPT Provider Note   CSN: [id number] Arrival date & time: 02/09/21  0020     History Chief Complaint  Patient presents with  . Fall    [name] is a 60 y.o. female with extensive past medical history including CHF, CKD, diabetes mellitus, alcohol abuse, and cirrhosis who presents to the emergency department via EMS status post mechanical fall with complaints of right upper extremity pain as well as right lower leg pain and laceration.  Patient states that she was ambulating when she tripped over the edge of the table and fell onto her right side.  She denies head injury or loss of consciousness.  Her pain is worse with movement.  No alleviating factors.  She has baseline neuropathy with no acute numbness or weakness that has developed.  She denies chest pain, abdominal pain, back pain, vomiting, change in her vision, or syncope. She is on plavix. She ambulates with a cane and/or a walker.  HPI     Past Medical History:  Diagnosis Date  . Alcohol abuse   . Alcoholic cirrhosis ([place])   . Anemia   . Anxiety   . Arthritis    "knees" (11/26/2018)  . B12 deficiency   . CAD (coronary artery disease)    a. 11/10/2014 Cath: LM nl, LAD min irregs, D1 30 ost, D2 50d, LCX 67m OM1 80 p/m (1.5 mm vessel), OM2 432mRCA nondom 9024mmed rx.. Demand ischemia in the setting of rapid a-fib.  . Cardiomyopathy (HCC[place]ykotsmovi Village . Carotid artery disease (HCC[place]inhatchee  a. 01/2015 Carotid Angio: RICA [id number]. [id number] L CEA; c. 05/2019 Carotid U/S: RICA 100. RECA >50. LICA [id number]. Cellulitis    lower extremities  . Chronic combined systolic and diastolic CHF (congestive heart failure) (HCCEvan  a. 10/2014 Echo: EF 40-45%; b. 10/2018 Echo: EF 45-50%, gr2 DD; c. 11/2019 Echo: EF 50%, mild LVH, gr2 DD (restrictive), antlat HK, Nl RV fxn. Mild BAE. RVSP 59.9mm90m  . CKD (chronic kidney disease), stage III ([name])[place]. Cocaine abuse ([name])[place]. COPD (chronic obstructive  pulmonary disease) ([name])[place]. Critical lower limb ischemia ([name])[place]. Depression   . Diabetic peripheral neuropathy ([name])[place]. Dyspnea   . Elevated troponin    a. Chronic elevation.  . GEMarland KitchenD (gastroesophageal reflux disease)   . Hyperlipemia   . Hypertension   . Hypokalemia   . Hypomagnesemia   . Hypothyroidism   . Marijuana abuse   . Narcotic abuse ([name])[place]. Noncompliance   . NSVT (nonsustained ventricular tachycardia) ([name])[place]. Obesity   . PAF (paroxysmal atrial fibrillation) ([name])[place]. Paroxysmal atrial tachycardia ([name])[place]. Peripheral arterial disease ([name])[place] a. 01/2015 Angio/PTA: RSFA 99 (atherectomy/pta) - 1 vessel runoff via diff dzs peroneal; b. 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  . Pneumonia    "once or twice" (11/26/2018)  . Poorly controlled type 2 diabetes mellitus ([name])[place]. Renal insufficiency    a. 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  . SlMarland Kitchenep apnea    "couldn't handle wearing the mask" (11/26/2018)  . Symptomatic bradycardia    a. Avoid AV blocking agent per EP. Prev req temp wire in 2017.  . Tobacco abuse     Patient Active Problem List   Diagnosis Date Noted  . Altered mental status  01/02/2021  . AF (paroxysmal atrial fibrillation) ([place]) 01/02/2021  . CAD (coronary artery disease) 01/02/2021  . PVD (peripheral vascular disease) ([place]) 01/02/2021  . Acute renal failure superimposed on stage 4 chronic kidney disease ([place]) 01/02/2021  . Diabetes mellitus type 2, uncontrolled, with complications ([place]) [id number]  . Diabetic nephropathy ([place]) 01/02/2021  . Hyperlipidemia 04/03/2020  . PAD (peripheral artery disease) ([place]) 12/26/2019  . Acute renal failure ([place])   . Acute respiratory failure with hypoxia ([place]) 12/02/2019  . Acute exacerbation of CHF (congestive heart failure) ([place]) 12/01/2019  . Cellulitis in diabetic foot ([place]) 07/08/2019  . Osteomyelitis ([place]) 06/21/2019  . NICM (nonischemic cardiomyopathy) ([place]) 06/20/2019  . Non-healing ulcer  ([place]) 06/20/2019  . Acute CHF (congestive heart failure) ([place]) 11/26/2018  . CHF (congestive heart failure) ([place]) 11/26/2018  . Acute respiratory failure ([place]) 10/21/2018  . AKI (acute kidney injury) ([place]) 08/18/2018  . Coagulation disorder ([place]) 08/09/2017  . Depression 07/21/2017  . At risk for adverse drug reaction 06/20/2017  . Peripheral neuropathy 06/20/2017  . Acute osteomyelitis of right foot ([place]) 06/13/2017  . S/P transmetatarsal amputation of foot, right ([place]) 06/05/2017  . 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 SIRS (systemic inflammatory response syndrome) ([place]) 04/06/2017  . CKD (chronic kidney disease), stage III ([place]) 11/24/2016  . Suspected sleep apnea 11/24/2016  . Ulcer of toe of right foot, with necrosis of bone ([place]) 10/27/2016  . Ulcer of left lower leg ([place]) 05/19/2016  . Severe obesity (BMI >= 40) ([place]) 02/24/2016  . COPD GOLD 0  02/24/2016  . Encounter for therapeutic drug monitoring 02/10/2016  . Symptomatic bradycardia 01/12/2016  . Essential hypertension 12/22/2015  . Chronic combined systolic and diastolic CHF (congestive heart failure) ([place])   . Wheeze   . 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 Tobacco abuse 10/23/2015  . Coronary artery disease   . DOE (dyspnea on exertion) 04/29/2015  . Diabetes mellitus type 2, uncontrolled ([place]) 02/08/2015  . Influenza A 02/07/2015  . Wrist fracture, left, with routine healing, subsequent encounter 02/05/2015  . Wrist fracture, left, closed, initial encounter 01/29/2015  . PAF (paroxysmal atrial fibrillation) ([place]) 01/16/2015  . Carotid arterial disease ([place]) 01/16/2015  . Claudication ([place]) 01/15/2015  . Demand ischemia ([place]) 10/29/2014  . Insomnia 02/03/2014  . COPD with acute exacerbation () 02/01/2014  . S/P peripheral artery angioplasty - [place] atherectomy; R SFA 09/11/2013    Class:  Acute  . Leg pain, bilateral 08/19/2013  . Hypothyroidism 07/31/2013  . Cellulitis 06/13/2013  . History of cocaine abuse ([place]) 06/13/2013  . Long term current use of anticoagulant therapy 05/20/2013  . Alcohol abuse   . Narcotic abuse ([place])   . Marijuana abuse   . Alcoholic cirrhosis ([place])   . DM (diabetes mellitus), type 2 with peripheral vascular complications [facility])     Past Surgical History:  Procedure Laterality Date  . ABDOMINAL AORTOGRAM N/A 06/26/2019   Procedure: ABDOMINAL AORTOGRAM;  Surgeon: Wellington Hampshire, MD;  Location: [facility] CV LAB;  Service: Cardiovascular;  Laterality: N/A;  . AMPUTATION Right 06/14/2017   Procedure: Right foot transmetatarsal amputation;  Surgeon: Newt Minion, MD;  Location: [facility];  Service: Orthopedics;  Laterality: Right;  . AMPUTATION Left 06/28/2019   Procedure: AMPUTATION LEFT FIFTH TOE;  Surgeon: Rosetta Posner, MD;  Location: [facility];  Service: Vascular;  Laterality: Left;  . AMPUTATION TOE Right  04/28/2017   Procedure: AMPUTATION OF RIGHT SECOND RAY;  Surgeon: Newt Minion, MD;  Location: [facility];  Service: Orthopedics;  Laterality: Right;  . CARDIAC CATHETERIZATION    . CARDIAC CATHETERIZATION N/A 01/12/2016   Procedure: Temporary Wire;  Surgeon: Minus Breeding, MD;  Location: [facility] CV LAB;  Service: Cardiovascular;  Laterality: N/A;  . CARDIOVERSION  ~ 02/2013   "twice"   . CAROTID ANGIOGRAM N/A 01/15/2015   Procedure: CAROTID ANGIOGRAM;  Surgeon: [name], MD;  Location: [facility] CATH LAB;  Service: Cardiovascular;  Laterality: N/A;  . DILATION AND CURETTAGE OF UTERUS  1988  . ENDARTERECTOMY Left 02/19/2015   Procedure: LEFT CAROTID ENDARTERECTOMY ;  Surgeon: Serafina Mitchell, MD;  Location: [facility];  Service: Vascular;  Laterality: Left;  . FEMORAL-TIBIAL BYPASS GRAFT Left 06/28/2019   Procedure: BYPASS GRAFT LEFT LEG FEMORAL TO ANTERIOR TIBIAL ARTERY using LEFT GREATER SAPHENOUS VEIN;  Surgeon: Rosetta Posner, MD;  Location: [facility];   Service: Vascular;  Laterality: Left;  . LEFT HEART CATHETERIZATION WITH CORONARY ANGIOGRAM N/A 10/31/2014   Procedure: LEFT HEART CATHETERIZATION WITH CORONARY ANGIOGRAM;  Surgeon: Burnell Blanks, MD;  Location[facility] CATH LAB;  Service: Cardiovascular;  Laterality: N/A;  . LOWER EXTREMITY ANGIOGRAM N/A 09/10/2013   Procedure: LOWER EXTREMITY ANGIOGRAM;  Surgeon: [name], MD;  Location[facility] CATH LAB;  Service: Cardiovascular;  Laterality: N/A;  . LOWER EXTREMITY ANGIOGRAM N/A 01/15/2015   Procedure: LOWER EXTREMITY ANGIOGRAM;  Surgeon: [name], MD;  Location: [facility] CATH LAB;  Service: Cardiovascular;  Laterality: N/A;  . LOWER EXTREMITY ANGIOGRAPHY N/A 04/13/2017   Procedure: Lower Extremity Angiography;  Surgeon: [name], MD;  Location: [facility] CV LAB;  Service: Cardiovascular;  Laterality: N/A;  . LOWER EXTREMITY ANGIOGRAPHY Left 06/26/2019   Procedure: LOWER EXTREMITY ANGIOGRAPHY;  Surgeon: Wellington Hampshire, MD;  Location: [facility] CV LAB;  Service: Cardiovascular;  Laterality: Left;  . PERIPHERAL VASCULAR BALLOON ANGIOPLASTY Left 06/26/2019   Procedure: PERIPHERAL VASCULAR BALLOON ANGIOPLASTY;  Surgeon: Wellington Hampshire, MD;  Location: [facility] CV LAB;  Service: Cardiovascular;  Laterality: Left;  unable to cross lt sfa occlusion  . PERIPHERAL VASCULAR INTERVENTION  04/13/2017   Procedure: Peripheral Vascular Intervention;  Surgeon: [name], MD;  Location: [facility] CV LAB;  Service: Cardiovascular;;  . PRESSURE SENSOR/CARDIOMEMS N/A 02/05/2020   Procedure: PRESSURE SENSOR/CARDIOMEMS;  Surgeon: Larey Dresser, MD;  Location: [facility] CV LAB;  Service: Cardiovascular;  Laterality: N/A;  . VEIN HARVEST Left 06/28/2019   Procedure: LEFT LEG GREATER SAPHENOUS VEIN HARVEST;  Surgeon: Rosetta Posner, MD;  [facility];  Service: Vascular;  Laterality: Left;     OB History    Gravida  1   Para  1   Term  1   Preterm      AB      Living   1     SAB      IAB      Ectopic      Multiple      Live Births              Family History  Problem Relation Age of Onset  . Hypertension Mother   . Diabetes Mother   . Cancer Mother        breast, ovarian, colon  . Clotting disorder Mother   . Heart disease Mother   . Heart attack Mother   . Breast cancer Mother  in 50's  . Hypertension Father   . Heart disease Father   . Emphysema Sister        smoked    Social History   Tobacco Use  . Smoking status: Current Some Day Smoker    Packs/day: 1.00    Years: 44.00    Pack years: 44.00    Types: E-cigarettes, Cigarettes  . Smokeless tobacco: Former Systems developer    Types: Snuff  Vaping Use  . Vaping Use: Former  . Devices: 11/26/2018 "stopped months ago"  Substance Use Topics  . Alcohol use: Yes    Comment: occ  . Drug use: Yes    Types: "Crack" cocaine, Marijuana, Oxycodone    Home Medications Prior to Admission medications   Medication Sig Start Date End Date Taking? Authorizing Provider  ACCU-CHEK GUIDE test strip USE TO CHECK BLOOD SUGAR FOUR TIMES DAILY 02/25/20   [provider]  albuterol (VENTOLIN HFA) 108 (90 Base) MCG/ACT inhaler Inhale 2 puffs into the lungs every 6 (six) hours as needed for wheezing or shortness of breath. 07/08/19   Oretha Milch D, MD  allopurinol (ZYLOPRIM) 100 MG tablet Take 100 mg by mouth daily. 11/25/20   [provider]  amiodarone (PACERONE) 200 MG tablet Take 0.5 tablets (100 mg total) by mouth daily. 10/15/20   Larey Dresser, MD  amLODipine (NORVASC) 10 MG tablet Take 1 tablet (10 mg total) by mouth daily. 10/15/20   Larey Dresser, MD  apixaban (ELIQUIS) 5 MG TABS tablet TAKE 1 TABLET(5 MG) BY MOUTH TWICE DAILY Patient taking differently: Take 5 mg by mouth 2 (two) times daily. 10/15/20   Larey Dresser, MD  atorvastatin (LIPITOR) 40 MG tablet Take 40 mg by mouth every evening.    [provider]  azithromycin (ZITHROMAX) 500 MG tablet  Take 1 tablet (500 mg total) by mouth daily at 6 PM. 01/05/21   Allie Bossier, MD  bisoprolol (ZEBETA) 5 MG tablet Take 1 tablet (5 mg total) by mouth daily. 10/15/20   Larey Dresser, MD  budesonide-formoterol [facility]) 160-4.5 MCG/ACT inhaler Inhale 2 puffs into the lungs 2 (two) times daily. 12/09/19   Eugenie Filler, MD  cefdinir (OMNICEF) 300 MG capsule Take 1 capsule (300 mg total) by mouth 2 (two) times daily. 01/05/21   Allie Bossier, MD  cefdinir (OMNICEF) 300 MG capsule Take 1 capsule (300 mg total) by mouth every 12 (twelve) hours. 01/05/21   Allie Bossier, MD  cephALEXin (KEFLEX) 500 MG capsule Take 1 capsule (500 mg total) by mouth 3 (three) times daily. 01/16/21   Jaynee Eagles, PA-C  cholecalciferol (VITAMIN D) 1000 units tablet Take 1 tablet (1,000 Units total) by mouth daily. 07/08/19   Oretha Milch D, MD  clopidogrel (PLAVIX) 75 MG tablet Take 1 tablet (75 mg total) by mouth daily. 10/15/20   Larey Dresser, MD  colchicine 0.6 MG tablet Take 0.6 mg by mouth daily as needed (gout attacks). 11/25/20   [provider]  folic acid (FOLVITE) 1 MG tablet Take 1 tablet (1 mg total) by mouth daily. 07/08/19   Oretha Milch D, MD  hydrALAZINE (APRESOLINE) 10 MG tablet Take 1 tablet (10 mg total) by mouth every 8 (eight) hours. 01/05/21   Allie Bossier, MD  insulin glargine (LANTUS) 100 UNIT/ML Solostar Pen Inject 20 Units into the skin daily. 01/05/21   Allie Bossier, MD  insulin lispro (HUMALOG) 100 UNIT/ML KwikPen Inject 8 Units into the skin 3 (  three) times daily with meals. 01/05/21   Allie Bossier, MD  levothyroxine (SYNTHROID, LEVOTHROID) 50 MCG tablet Take 1 tablet (50 mcg total) by mouth daily. 08/09/17   Medina-Vargas, Monina C, NP  losartan (COZAAR) 100 MG tablet Take 1 tablet (100 mg total) by mouth daily. 10/15/20   Larey Dresser, MD  nicotine (NICODERM CQ - DOSED IN MG/24 HOURS) 21 mg/24hr patch Place 1 patch (21 mg total) onto the skin daily. 01/05/21   Allie Bossier, MD  Omega-3 1000 MG CAPS Take 1,000 mg by mouth 2 (two) times daily.    [provider]  pantoprazole (PROTONIX) 40 MG tablet Take 1 tablet (40 mg total) by mouth daily. 02/21/20   Larey Dresser, MD  predniSONE (DELTASONE) 20 MG tablet Take 2 tablets (40 mg total) by mouth daily with breakfast. 01/06/21   Allie Bossier, MD  tiotropium (SPIRIVA HANDIHALER) 18 MCG inhalation capsule Place 1 capsule (18 mcg total) into inhaler and inhale every morning. 12/09/19   Eugenie Filler, MD  torsemide (DEMADEX) 20 MG tablet TAKE 3 TABLETS BY MOUTH IN  THE MORNING AND 2 TABLETS  BY MOUTH IN THE EVENING 01/15/21   Clegg, [name] D, NP    Allergies    Gabapentin and Lyrica [pregabalin]  Review of Systems   Review of Systems  Constitutional: Negative for chills and fever.  Eyes: Negative for visual disturbance.  Respiratory: Negative for shortness of breath.   Cardiovascular: Negative for chest pain.  Gastrointestinal: Negative for abdominal pain and vomiting.  Musculoskeletal: Positive for arthralgias and myalgias.  Skin: Positive for wound.  Neurological:       Positive for baseline neuropathy without acute change.  All other systems reviewed and are negative.   Physical Exam Updated Vital Signs BP (!) 159/76   Pulse 85   Temp 99.5 F (37.5 C) (Oral)   Resp 15   LMP 11/27/2012   SpO2 100%   Physical Exam Vitals and nursing note reviewed.  Constitutional:      General: She is not in acute distress.    Appearance: She is well-developed. She is not toxic-appearing.  HENT:     Head: Normocephalic and atraumatic.     Comments: No raccoon eyes or battle sign. Eyes:     General:        Right eye: No discharge.        Left eye: No discharge.     Extraocular Movements: Extraocular movements intact.     Conjunctiva/sclera: Conjunctivae normal.     Pupils: Pupils are equal, round, and reactive to light.  Neck:     Comments: No midline tenderness or palpable step-off.   Intact active range of motion. Cardiovascular:     Rate and Rhythm: Normal rate and regular rhythm.     Comments: Palpable symmetric radial and PT pulses bilaterally. Pulmonary:     Effort: Pulmonary effort is normal. No respiratory distress.     Breath sounds: Normal breath sounds. No wheezing, rhonchi or rales.  Chest:     Chest wall: No tenderness.  Abdominal:     General: There is no distension.     Palpations: Abdomen is soft.     Tenderness: There is no abdominal tenderness. There is no guarding or rebound.  Musculoskeletal:     Cervical back: Neck supple.     Comments: Upper extremities: Patient has bruising/ecchymosis to the dorsal aspect of the right hand and forearm.  She has some swelling to  the dorsum of the right hand.  She has intact active range of motion throughout the shoulders, elbows, wrist, and digits bilaterally.  She is tender to palpation to the right trapezius, diffuse glenohumeral joint, proximal one third of the humerus, the posterior elbow, the diffuse forearm and wrist, as well as to the dorsum of the hand and the right upper extremity.  Compartments are soft.  Left upper extremity without tenderness to palpation.  No focal anatomical snuffbox tenderness. Back: No midline tenderness or palpable step-off Lower extremities: Patient is status post right lower extremity transmetatarsal amputation.  She also has an amputation of left fifth toe.  Her left fourth toe has a callus to the dorsal of the digit.  Her transmetatarsal amputation site also has somewhat of a callus.  She has a 6 cm laceration to the right anterior lower leg with a blood collection below thin layer of skin just lateral to laceration. No active bleeding.  No visible foreign bodies.  Patient able to actively range of the hips and the knees bilaterally.  She is tender to palpation to the anterior right lower leg.  Otherwise nontender.  Compartments are soft.  Skin:    General: Skin is warm and dry.      Findings: No rash.  Neurological:     Mental Status: She is alert.     Comments: Clear speech.  Sensation grossly intact bilateral upper and lower extremities.  5-5 symmetric grip strength & strength with plantar/dorsiflexion bilaterally.   Psychiatric:        Behavior: Behavior normal.     ED Results / Procedures / Treatments   Labs (all labs ordered are listed, but only abnormal results are displayed) Labs Reviewed - No data to display  EKG None  Radiology DG Shoulder Right  Result Date: 02/09/2021 CLINICAL DATA:  Fall EXAM: RIGHT SHOULDER - 2+ VIEW COMPARISON:  None. FINDINGS: There is no evidence of fracture or dislocation. There is no evidence of arthropathy or other focal bone abnormality. Soft tissues are unremarkable. IMPRESSION: Negative. Electronically Signed   By: Ulyses Jarred M.D.   On: 02/09/2021 02:18   DG Elbow Complete Right  Result Date: 02/09/2021 CLINICAL DATA:  Fall EXAM: RIGHT ELBOW - COMPLETE 3+ VIEW COMPARISON:  None. FINDINGS: No fracture or dislocation at the right elbow. There is an old, healed fracture of the radial head. IMPRESSION: No acute fracture or dislocation of the right elbow. Electronically Signed   By: Ulyses Jarred M.D.   On: 02/09/2021 02:20   DG Forearm Right  Addendum Date: 02/09/2021   ADDENDUM REPORT: 02/09/2021 02:26 ADDENDUM: Chronic widening of the scapholunate interval is unchanged since 03/03/2020. Increased fragmentation of laterally projecting scaphoid osteophyte. Electronically Signed   By: Ulyses Jarred M.D.   On: 02/09/2021 02:26   Result Date: 02/09/2021 CLINICAL DATA:  Fall EXAM: RIGHT FOREARM - 2 VIEW COMPARISON:  03/03/2020 FINDINGS: Remote posttraumatic deformity of the right radial head. No acute abnormality. IMPRESSION: Remote posttraumatic deformity of the right radial head. No acute fracture or dislocation. Electronically Signed: By: Ulyses Jarred M.D. On: 02/09/2021 02:22   DG Tibia/Fibula Right  Result Date:  02/09/2021 CLINICAL DATA:  Fall EXAM: RIGHT TIBIA AND FIBULA - 2 VIEW COMPARISON:  None. FINDINGS: There is no evidence of fracture or other focal bone lesions. Soft tissues are unremarkable. IMPRESSION: Negative. Electronically Signed   By: Ulyses Jarred M.D.   On: 02/09/2021 02:15   DG Hand Complete Right  Result Date: 02/09/2021  CLINICAL DATA:  Trip and fall with medial hand pain, initial encounter EXAM: RIGHT HAND - COMPLETE 3+ VIEW COMPARISON:  None. FINDINGS: Mild degenerative changes are noted in the interphalangeal joints. Mild cortical irregularity is noted in the mid to distal fifth metacarpal suggestive of undisplaced fracture. No other focal abnormality is seen. Vascular calcifications are noted. IMPRESSION: Cortical irregularity in the mid to distal fifth metacarpal suggestive of undisplaced fracture. Electronically Signed   By: Inez Catalina M.D.   On: 02/09/2021 02:26    Procedures .Marland KitchenLaceration Repair  Date/Time: 02/09/2021 4:09 AM Performed by: Amaryllis Dyke, PA-C Authorized by: Amaryllis Dyke, PA-C   Consent:    Consent obtained:  Verbal   Consent given by:  Patient   Risks, benefits, and alternatives were discussed: yes     Risks discussed:  Need for additional repair, infection, pain, nerve damage, poor wound healing, poor cosmetic result, retained foreign body, tendon damage and vascular damage   Alternatives discussed:  No treatment Anesthesia:    Anesthesia method:  Local infiltration   Local anesthetic:  Lidocaine 2% WITH epi Laceration details:    Location:  Leg   Leg location:  R lower leg   Length (cm):  6   Depth (mm):  4 Pre-procedure details:    Preparation:  Patient was prepped and draped in usual sterile fashion and imaging obtained to evaluate for foreign bodies Exploration:    Hemostasis achieved with:  Direct pressure   Contaminated: no   Treatment:    Area cleansed with:  Chlorhexidine   Amount of cleaning:  Standard   Irrigation  solution:  Sterile water   Irrigation volume:  1L   Irrigation method:  Pressure wash Skin repair:    Repair method:  Sutures   Suture size:  3-0   Suture material:  Nylon   Suture technique:  Simple interrupted   Number of sutures:  5 Approximation:    Approximation:  Close Repair type:    Repair type:  Simple Post-procedure details:    Procedure completion:  Tolerated well, no immediate complications     Medications Ordered in ED Medications - No data to display  ED Course  I have reviewed the triage vital signs and the nursing notes.  Pertinent labs & imaging results that were available during my care of the patient were reviewed by me and considered in my medical decision making (see chart for details).    MDM Rules/Calculators/A&P                          Patient presents to the ED with complaints of fall with right upper and lower extremity injuries.  Patient nontoxic, resting comfortably, vitals with elevated blood pressure, doubt hypertensive emergency.  No signs of serious head, neck, or back injury.  No midline spinal tenderness or focal neurologic deficits.  No head injury reported.  She has no chest or abdominal tenderness.  Plan for musculoskeletal x-rays.  Patient will need right lower extremity laceration repair.  She reports that her tetanus was last performed last year.  Additional history obtained:  Additional history obtained from chart review & nursing note review.   Imaging Studies ordered:  I ordered imaging studies which included x-rays of the right shoulder, elbow, forearm, hand, and tibia/fibular, I independently reviewed, formal radiology impressions reviewed- Cortical irregularity in the mid to distal fifth metacarpal suggestive of undisplaced fracture, otherwise does not appear to have an acute injury.  ED Course:  In regards to findings suggestive of mid to distal fifth metacarpal fracture we will place an ulnar gutter splint.  She is neurovascular  intact distally.  In terms of her right lower extremity laceration, no underlying fracture or dislocation or visible radiopaque foreign bodies.  Wound was cleansed, pressure irrigated and visualized in a bloodless field, no evidence of foreign body.  Repaired per procedure note above.  Tolerated well.  Given patient's history of uncontrolled diabetes will place on prophylactic Keflex.  Her tetanus is up-to-date.  She is neurovascular intact distally.  Overall appears appropriate for discharge home.  I discussed results, treatment plan, need for follow-up, and return precautions with the patient. Provided opportunity for questions, patient confirmed understanding and is in agreement with plan. Findings and plan of care discussed with supervising physician Dr. Sedonia Small who is in agreement.   Portions of this note were generated with Lobby[employer]. Dictation errors may occur despite best attempts at proofreading.  Final Clinical Impression(s) / ED Diagnoses Final diagnoses:  Fall, initial encounter  Laceration of right lower extremity, initial encounter  Closed nondisplaced fracture of fifth metacarpal bone of right hand, unspecified portion of metacarpal, initial encounter    Rx / DC Orders ED Discharge Orders    None       Amaryllis Dyke, PA-C 02/09/21 0557    Maudie Flakes, MD 02/09/21 [phone]

## 2021-02-09 NOTE — ED Triage Notes (Addendum)
Pt arrives EMS from home after a tripping on table. C/o approx 6cm laceration to right shin. Currently taking Plavix for blood clots. Denies hitting head or LOC.

## 2021-02-09 NOTE — Progress Notes (Signed)
Orthopedic Tech Progress Note Patient Details:  [name] [birth year] [id number]  Ortho Devices Type of Ortho Device: Ulna gutter splint Ortho Device/Splint Location: rue Ortho Device/Splint Interventions: Ordered,Application,Adjustment   Post Interventions Patient Tolerated: Well Instructions Provided: Care of device,Adjustment of device   [name] 02/09/2021, 5:11 AM

## 2021-02-17 ENCOUNTER — Other Ambulatory Visit: Payer: Self-pay

## 2021-02-17 ENCOUNTER — Ambulatory Visit (INDEPENDENT_AMBULATORY_CARE_PROVIDER_SITE_OTHER): Payer: Medicare Other | Admitting: Podiatry

## 2021-02-17 ENCOUNTER — Encounter: Payer: Self-pay | Admitting: Podiatry

## 2021-02-17 DIAGNOSIS — L97501 Non-pressure chronic ulcer of other part of unspecified foot limited to breakdown of skin: Secondary | ICD-10-CM | POA: Insufficient documentation

## 2021-02-17 DIAGNOSIS — Z89431 Acquired absence of right foot: Secondary | ICD-10-CM | POA: Diagnosis not present

## 2021-02-17 DIAGNOSIS — I739 Peripheral vascular disease, unspecified: Secondary | ICD-10-CM

## 2021-02-17 DIAGNOSIS — M79676 Pain in unspecified toe(s): Secondary | ICD-10-CM | POA: Diagnosis not present

## 2021-02-17 DIAGNOSIS — E0843 Diabetes mellitus due to underlying condition with diabetic autonomic (poly)neuropathy: Secondary | ICD-10-CM

## 2021-02-17 DIAGNOSIS — B351 Tinea unguium: Secondary | ICD-10-CM | POA: Diagnosis not present

## 2021-02-17 DIAGNOSIS — N1832 Chronic kidney disease, stage 3b: Secondary | ICD-10-CM

## 2021-02-17 DIAGNOSIS — L97521 Non-pressure chronic ulcer of other part of left foot limited to breakdown of skin: Secondary | ICD-10-CM

## 2021-02-17 NOTE — Progress Notes (Signed)
This patient returns to my office for at risk foot care.  This patient requires this care by a professional since this patient will be at risk due to having  PAD, coagulation defect, and chronic kidney disease.  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  Patient is taking plavix.  This patient is unable to cut nails herself since the patient cannot reach her nails.These nails on her left foot  are painful walking and wearing shoes.  This patient has two dried skin lesions under her big toe left foot.  She also has an open wound  noted along  the amputation site  right foot. This patient presents for at risk foot care today.  General Appearance  Alert, conversant and in no acute stress.  Vascular  Dorsalis pedis and posterior tibial  pulses are not  palpable  bilaterally.  Capillary return is diminished   bilaterally. Cold feet  bilaterally.  Neurologic  Senn-Weinstein monofilament wire test diminished.  bilaterally. Muscle power within normal limits bilaterally.  Nails Thick disfigured discolored nails with subungual debris  1-4 left foot.  . No evidence of bacterial infection or drainage bilaterally.  Orthopedic  No limitations of motion  feet .  No crepitus or effusions noted.  No bony pathology or digital deformities noted. TMA right foot.  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  No signs of infections or ulcers noted.  Two healed blood blisters under her hallux left foot.  Ulcer noted distal first metatarsal head right foot.  No evidence of redness or infection noted.   Onychomycosis    Pain in left toes  Consent was obtained for treatment procedures.   Mechanical debridement of nails 1-4  Left foot. performed with a nail nipper.  Filed with dremel without incident. Dr.  Posey Pronto was contacted for a second opinion on ulcer right foot.  He evaluated the lesion and bandaged his wound with betadine/DSD.  Told the patient to  leave the bandage on and go to the wound center for her leg and tell them she has developed open wound right foot.  Dispense post-op shoe to be worn.  RTC 2 weeks.    Return office visit  2 weeks                     Told patient to return for periodic foot care and evaluation due to potential at risk complications.   Gardiner Barefoot DPM

## 2021-02-19 ENCOUNTER — Encounter (HOSPITAL_BASED_OUTPATIENT_CLINIC_OR_DEPARTMENT_OTHER): Payer: Medicare Other | Attending: Internal Medicine | Admitting: Internal Medicine

## 2021-02-19 ENCOUNTER — Other Ambulatory Visit: Payer: Self-pay

## 2021-02-19 DIAGNOSIS — E1140 Type 2 diabetes mellitus with diabetic neuropathy, unspecified: Secondary | ICD-10-CM | POA: Insufficient documentation

## 2021-02-19 DIAGNOSIS — Z8249 Family history of ischemic heart disease and other diseases of the circulatory system: Secondary | ICD-10-CM | POA: Insufficient documentation

## 2021-02-19 DIAGNOSIS — Z87891 Personal history of nicotine dependence: Secondary | ICD-10-CM | POA: Diagnosis not present

## 2021-02-19 DIAGNOSIS — Z8349 Family history of other endocrine, nutritional and metabolic diseases: Secondary | ICD-10-CM | POA: Diagnosis not present

## 2021-02-19 DIAGNOSIS — I480 Paroxysmal atrial fibrillation: Secondary | ICD-10-CM | POA: Diagnosis not present

## 2021-02-19 DIAGNOSIS — I2510 Atherosclerotic heart disease of native coronary artery without angina pectoris: Secondary | ICD-10-CM | POA: Insufficient documentation

## 2021-02-19 DIAGNOSIS — E1122 Type 2 diabetes mellitus with diabetic chronic kidney disease: Secondary | ICD-10-CM | POA: Insufficient documentation

## 2021-02-19 DIAGNOSIS — L97518 Non-pressure chronic ulcer of other part of right foot with other specified severity: Secondary | ICD-10-CM | POA: Diagnosis not present

## 2021-02-19 DIAGNOSIS — E11621 Type 2 diabetes mellitus with foot ulcer: Secondary | ICD-10-CM | POA: Diagnosis not present

## 2021-02-19 DIAGNOSIS — Z833 Family history of diabetes mellitus: Secondary | ICD-10-CM | POA: Insufficient documentation

## 2021-02-19 DIAGNOSIS — E1151 Type 2 diabetes mellitus with diabetic peripheral angiopathy without gangrene: Secondary | ICD-10-CM | POA: Diagnosis not present

## 2021-02-19 DIAGNOSIS — I130 Hypertensive heart and chronic kidney disease with heart failure and stage 1 through stage 4 chronic kidney disease, or unspecified chronic kidney disease: Secondary | ICD-10-CM | POA: Diagnosis not present

## 2021-02-19 DIAGNOSIS — I509 Heart failure, unspecified: Secondary | ICD-10-CM | POA: Insufficient documentation

## 2021-02-19 DIAGNOSIS — L97811 Non-pressure chronic ulcer of other part of right lower leg limited to breakdown of skin: Secondary | ICD-10-CM | POA: Insufficient documentation

## 2021-02-19 DIAGNOSIS — N1832 Chronic kidney disease, stage 3b: Secondary | ICD-10-CM | POA: Insufficient documentation

## 2021-02-22 NOTE — Progress Notes (Signed)
**Note Karla-Identified via Obfuscation** CAPRICIA, GRAVELINE ([id number]) Visit Report for 02/19/2021 Abuse/Suicide Risk Screen Details Patient Name: Date of Service: [name] 02/19/2021 9:00 A M Medical Record Number: [id number] Patient Account Number: [id number] Date of Birth/Sex: Treating RN: 1961/03/18 (60 y.o. [name], [name] Primary Care [name]: [name] Other Clinician: Referring [name]: Treating [name]/Extender: [name] in Treatment: 0 Abuse/Suicide Risk Screen Items Answer ABUSE RISK SCREEN: Has anyone close to you tried to hurt or harm you recentlyo No Do you feel uncomfortable with anyone in your familyo No Has anyone forced you do things that you didnt want to doo No Electronic Signature(s) Signed: 02/22/2021 5:44:40 PM Price: Karla Hammock RN [name]: [name] on 02/19/2021 09:18:52 -------------------------------------------------------------------------------- Activities of Daily Living Details Patient Name: Date of Service: Karla Corwin, DO Karla S. 02/19/2021 9:00 A M Medical Record Number: [id number] Patient Account Number: [id number] Date of Birth/Sex: Treating RN: 02/16/1961 (60 y.o. [name], [name] Primary Care Seiya Silsby: [name] Other Clinician: Referring [name]: Treating [name]/Extender: [name] in Treatment: 0 Activities of Daily Living Items Answer Activities of Daily Living (Please select one for each item) Drive Automobile Not Able T Medications ake Completely Able Use T elephone Completely Able Care for Appearance Need Assistance Use T oilet Completely Able Bath / Shower Completely Able Dress Self Completely Able Feed Self Completely Able Walk Need Assistance Get In / Out Bed Completely Able Housework Need Assistance Prepare Meals Completely [name] for Self Completely Able Electronic Signature(s) Signed: 02/22/2021 5:44:40 PM Price: Karla Hammock RN [name]: [name] on  02/19/2021 09:19:24 -------------------------------------------------------------------------------- Education Screening Details Patient Name: Date of Service: Karla Corwin, DO Karla S. 02/19/2021 9:00 A M Medical Record Number: [id number] Patient Account Number: [id number] Date of Birth/Sex: Treating RN: 11/23/1960 (60 y.o. [name], [name] Primary Care [name]: [name] Other Clinician: Referring [name]: Treating [name]/Extender: [name] in Treatment: 0 Primary Learner Assessed: Patient Learning Preferences/Education Level/Primary Language Learning Preference: Explanation, Demonstration, Communication Board, Printed Material Highest Education Level: High School Preferred Language: English Cognitive Barrier Language Barrier: No Translator Needed: No Memory Deficit: No Emotional Barrier: No Cultural/Religious Beliefs Affecting Medical Care: No Physical Barrier Impaired Vision: No Impaired Hearing: No Decreased Hand dexterity: No Knowledge/Comprehension Knowledge Level: High Comprehension Level: High Ability to understand written instructions: High Ability to understand verbal instructions: High Motivation Anxiety Level: Calm Cooperation: Cooperative Education Importance: Denies Need Interest in Health Problems: Asks Questions Perception: Coherent Willingness to Engage in Self-Management High Activities: Readiness to Engage in Self-Management High Activities: Electronic Signature(s) Signed: 02/22/2021 5:44:40 PM Price: Karla Hammock RN [name]: [name] on 02/19/2021 09:19:51 -------------------------------------------------------------------------------- Fall Risk Assessment Details Patient Name: Date of Service: Karla Corwin, DO Karla S. 02/19/2021 9:00 A M Medical Record Number: [id number] Patient Account Number: [id number] Date of Birth/Sex: Treating RN: 1961-02-21 (59 y.o. [name], [name] Primary Care [name]: [name] Other  Clinician: Referring [name]: Treating [name]/Extender: [name] in Treatment: 0 Fall Risk Assessment Items Have you had 2 or more falls in the last 12 monthso 0 Yes Have you had any fall that resulted in injury in the last 12 monthso 0 Yes FALLS RISK SCREEN History of falling - immediate or within 3 months 25 Yes Secondary diagnosis (Do you have 2 or more medical diagnoseso) 0 No Ambulatory aid None/bed rest/wheelchair/nurse 0 Yes Crutches/cane/walker 0 No Furniture 0 No Intravenous therapy Access/Saline/Heparin Lock 0 No Gait/Transferring Normal/ bed rest/ wheelchair 0 Yes Weak (  short steps with or without shuffle, stooped but able to lift head while walking, may seek 0 No support from furniture) Impaired (short steps with shuffle, may have difficulty arising from chair, head down, impaired 0 No balance) Mental Status Oriented to own ability 0 Yes Electronic Signature(s) Signed: 02/22/2021 5:44:40 PM Price: Karla Hammock RN [name]: [name] on 02/19/2021 09:20:18 -------------------------------------------------------------------------------- Foot Assessment Details Patient Name: Date of Service: Karla Corwin, DO Karla S. 02/19/2021 9:00 A M Medical Record Number: [name] Patient Account Number: [id number] Date of Birth/Sex: Treating RN: 11/09/1961 (60 y.o. [name], [name] Primary Care [name]: [name] Other Clinician: Referring [name]: Treating [name]/Extender: [name] in Treatment: 0 Foot Assessment Items Site Locations + = Sensation present, - = Sensation absent, C = Callus, U = Ulcer R = Redness, W = Warmth, M = Maceration, PU = Pre-ulcerative lesion F = Fissure, S = Swelling, D = Dryness Assessment Right: Left: Other Deformity: No No Prior Foot Ulcer: No No Prior Amputation: Yes No Charcot Joint: No No Ambulatory Status: Ambulatory With Help Assistance Device: Wheelchair Gait: Steady Electronic  Signature(s) Signed: 02/22/2021 5:44:40 PM Price: Karla Hammock RN [name]: [name] on 02/19/2021 09:48:53 -------------------------------------------------------------------------------- Nutrition Risk Screening Details Patient Name: Date of Service: Karla Corwin, DO Karla S. 02/19/2021 9:00 A M Medical Record Number: [name] Patient Account Number: [id number] Date of Birth/Sex: Treating RN: 09/04/1961 (60 y.o. [name], [name] Primary Care Lyndel Sarate: [name] Other Clinician: Referring Jenevie Casstevens: Treating Aurilla Coulibaly/Extender: [name] in Treatment: 0 Height (in): 64 Weight (lbs): 255 Body Mass Index (BMI): 43.8 Nutrition Risk Screening Items Score Screening NUTRITION RISK SCREEN: I have an illness or condition that made me change the kind and/or amount of food I eat 0 No I eat fewer than two meals per day 0 No I eat few fruits and vegetables, or milk products 0 No I have three or more drinks of beer, liquor or wine almost every day 0 No I have tooth or mouth problems that make it hard for me to eat 0 No I don't always have enough money to buy the food I need 0 No I eat alone most of the time 0 No I take three or more different prescribed or over-the-counter drugs a day 0 No Without wanting to, I have lost or gained 10 pounds in the last six months 0 No I am not always physically able to shop, cook and/or feed myself 0 No Nutrition Protocols Good Risk Protocol 0 No interventions needed Moderate Risk Protocol High Risk Proctocol Risk Level: Good Risk Score: 0 Electronic Signature(s) Signed: 02/22/2021 5:44:40 PM Price: Karla Hammock RN [name]: [name] on 02/19/2021 [id number]

## 2021-02-22 NOTE — Progress Notes (Signed)
ANNAALICIA, VIELMAN ([id number]) Visit Report for 02/19/2021 Allergy List Details Patient Name: Date of Service: [name] 02/19/2021 9:00 A M Medical Record Number: [id number] Patient Account Number: [id number] Date of Birth/Sex: Treating RN: 1961-03-26 (60 y.o. Tonita Phoenix, Lauren Primary Care Dajour Pierpoint: [name] Other Clinician: Referring Jaasiel Hollyfield: Treating Brylee Berk/Extender: [name]: 0 Allergies Active Allergies gabapentin Reaction: Shakes Severity: Moderate Lyrica Allergy Notes Electronic Signature(s) Signed: 02/22/2021 5:44:40 PM By: [name] RN Entered By: [name] on 02/19/2021 09:09:34 -------------------------------------------------------------------------------- Arrival Information Details Patient Name: Date of Service: [name], [name] 02/19/2021 9:00 A M Medical Record Number: [id number] Patient Account Number: [id number] Date of Birth/Sex: Treating RN: 1960-11-16 (60 y.o. Tonita Phoenix, Lauren Primary Care Mareena Cavan: [name] Other Clinician: Referring Auguste Tebbetts: Treating Loida Calamia/Extender: [name]: 0 Visit Information Patient Arrived: Ambulatory Arrival Time: 09:08 Accompanied By: self Transfer Assistance: None Patient Identification Verified: Yes Secondary Verification Process Completed: Yes History Since Last Visit Added or deleted any medications: No Any new allergies or adverse reactions: No Had a fall or experienced change in activities of daily living that may affect risk of falls: No Signs or symptoms of abuse/neglect since last visito No Hospitalized since last visit: No Implantable device outside of the clinic excluding cellular tissue based products placed in the center since last visit: No Electronic Signature(s) Signed: 02/22/2021 5:44:40 PM By: [name] RN Entered By: [name] on 02/19/2021  09:09:01 -------------------------------------------------------------------------------- Clinic Level of Care Assessment Details Patient Name: Date of Service: HA LEY, [name] 02/19/2021 9:00 A M Medical Record Number: [id number] Patient Account Number: [id number] Date of Birth/Sex: Treating RN: [birth year] (60 y.o. [name] Primary Care Destinae Neubecker: [name] Other Clinician: Referring Dartanian Knaggs: Treating Dshaun Reppucci/Extender: [name]: 0 Clinic Level of Care Assessment Items TOOL 2 Quantity Score X- 1 0 Use when only an EandM is performed on the INITIAL visit ASSESSMENTS - Nursing Assessment / Reassessment X- 1 20 General Physical Exam (combine w/ comprehensive assessment (listed just below) when performed on new pt. evals) X- 1 25 Comprehensive Assessment (HX, ROS, Risk Assessments, Wounds Hx, etc.) ASSESSMENTS - Wound and Skin A ssessment / Reassessment '[]'$  - 0 Simple Wound Assessment / Reassessment - one wound X- 3 5 Complex Wound Assessment / Reassessment - multiple wounds '[]'$  - 0 Dermatologic / Skin Assessment (not related to wound area) ASSESSMENTS - Ostomy and/or Continence Assessment and Care '[]'$  - 0 Incontinence Assessment and Management '[]'$  - 0 Ostomy Care Assessment and Management (repouching, etc.) PROCESS - Coordination of Care '[]'$  - 0 Simple Patient / Family Education for ongoing care X- 1 20 Complex (extensive) Patient / Family Education for ongoing care X- 1 10 Staff obtains Programmer, systems, Records, T Results / Process Orders est X- 1 10 Staff telephones HHA, Nursing Homes / Clarify orders / etc '[]'$  - 0 Routine Transfer to another Facility (non-emergent condition) '[]'$  - 0 Routine Hospital Admission (non-emergent condition) '[]'$  - 0 New Admissions / Biomedical engineer / Ordering NPWT Apligraf, etc. , '[]'$  - 0 Emergency Hospital Admission (emergent condition) '[]'$  - 0 Simple Discharge Coordination '[]'$  - 0 Complex (extensive)  Discharge Coordination PROCESS - Special Needs '[]'$  - 0 Pediatric / Minor Patient Management '[]'$  - 0 Isolation Patient Management '[]'$  - 0 Hearing / Language / Visual special needs '[]'$  - 0 Assessment of Community assistance (transportation, D/C planning, etc.) '[]'$  - 0 Additional assistance / Altered mentation '[]'$  - 0 Support  Surface(s) Assessment (bed, cushion, seat, etc.) INTERVENTIONS - Wound Cleansing / Measurement X- 1 5 Wound Imaging (photographs - any number of wounds) '[]'$  - 0 Wound Tracing (instead of photographs) '[]'$  - 0 Simple Wound Measurement - one wound X- 3 5 Complex Wound Measurement - multiple wounds '[]'$  - 0 Simple Wound Cleansing - one wound X- 3 5 Complex Wound Cleansing - multiple wounds INTERVENTIONS - Wound Dressings '[]'$  - 0 Small Wound Dressing one or multiple wounds '[]'$  - 0 Medium Wound Dressing one or multiple wounds X- 1 20 Large Wound Dressing one or multiple wounds '[]'$  - 0 Application of Medications - injection INTERVENTIONS - Miscellaneous '[]'$  - 0 External ear exam '[]'$  - 0 Specimen Collection (cultures, biopsies, blood, body fluids, etc.) '[]'$  - 0 Specimen(s) / Culture(s) sent or taken to Lab for analysis '[]'$  - 0 Patient Transfer (multiple staff / Civil Service fast streamer / Similar devices) X- 1 5 Simple Staple / Suture removal (25 or less) '[]'$  - 0 Complex Staple / Suture removal (26 or more) '[]'$  - 0 Hypo / Hyperglycemic Management (close monitor of Blood Glucose) '[]'$  - 0 Ankle / Brachial Index (ABI) - do not check if billed separately Has the patient been seen at the hospital within the last three years: Yes Total Score: 160 Level Of Care: New/Established - Level 5 Electronic Signature(s) Signed: 02/19/2021 5:03:53 PM By: Lorrin Jackson Entered By: Lorrin Jackson on 02/19/2021 10:25:53 -------------------------------------------------------------------------------- Encounter Discharge Information Details Patient Name: Date of Service: [name], [name] 02/19/2021  9:00 A M Medical Record Number: [id number] Patient Account Number: [id number] Date of Birth/Sex: Treating RN: [birth year] (60 y.o. [name] Primary Care Laporshia Hogen: [name] Other Clinician: Referring Lianah Peed: Treating Anacristina Steffek/Extender: [name]: 0 Encounter Discharge Information Items Discharge Condition: Stable Ambulatory Status: Wheelchair Discharge Destination: Home Transportation: Private Auto Accompanied By: sister Schedule Follow-up Appointment: Yes Clinical Summary of Care: Electronic Signature(s) Signed: 02/19/2021 4:53:51 PM By: Deon Pilling Entered By: Deon Pilling on 02/19/2021 11:42:19 -------------------------------------------------------------------------------- Lower Extremity Assessment Details Patient Name: Date of Service: [name] 02/19/2021 9:00 A M Medical Record Number: [id number] Patient Account Number: [id number] Date of Birth/Sex: Treating RN: [birth year] (60 y.o. Tonita Phoenix, Lauren Primary Care Yulisa Chirico: [name] Other Clinician: Referring Stormey Wilborn: Treating Trenden Hazelrigg/Extender: [name]: 0 Edema Assessment Assessed: Shirlyn Goltz: No] [Right: Yes] Edema: [Left: Ye] [Right: s] Calf Left: Right: Point of Measurement: From Medial Instep 37 cm Ankle Left: Right: Point of Measurement: From Medial Instep 24.5 cm Vascular Assessment Pulses: Dorsalis Pedis Palpable: [Right:Yes] Posterior Tibial Palpable: [Right:Yes] Electronic Signature(s) Signed: 02/22/2021 5:44:40 PM By: [name] RN Entered By: [name] on 02/19/2021 09:49:05 -------------------------------------------------------------------------------- Multi Wound Chart Details Patient Name: Date of Service: [name], [name] 02/19/2021 9:00 A M Medical Record Number: [id number] Patient Account Number: [id number] Date of Birth/Sex: Treating RN: 02-21-1961 (60 y.o. [name] Primary Care Joelly Bolanos:  [name] Other Clinician: Referring Pamala Hayman: Treating Jeanine Caven/Extender: [name]: 0 Vital Signs Height(in): Capillary Blood Glucose(mg/dl): 198 Weight(lbs): Pulse(bpm): 73 Body Mass Index(BMI): Blood Pressure(mmHg): 152/72 Temperature(F): 98.5 Respiratory Rate(breaths/min): 17 Photos: [16:No Photos Right, Plantar Amputation Site -] [17:No Photos Right, Medial Lower Leg] [18:No Photos Right, Anterior Lower Leg] Wound Location: [16:Transmetatarsal Gradually Appeared] [17:Blister] [18:Trauma] Wounding Event: [16:Diabetic Wound/Ulcer of the Lower] [17:Diabetic Wound/Ulcer of the Lower] [18:Diabetic Wound/Ulcer of the Lower] Primary Etiology: [16:Extremity Chronic Obstructive Pulmonary] [17:Extremity Chronic Obstructive Pulmonary] [18:Extremity Chronic Obstructive Pulmonary] Comorbid History: [  16:Disease (COPD), Sleep Apnea, Arrhythmia, Congestive Heart Failure, Coronary Artery Disease, Hypertension, Peripheral Arterial Disease, Cirrhosis , Type II Diabetes, Osteoarthritis, Neuropathy 02/05/2021] [17:Disease (COPD), Sleep  Apnea, Arrhythmia, Congestive Heart Failure, Coronary Artery Disease, Hypertension, Peripheral Arterial Disease, Cirrhosis , Type II Diabetes, Osteoarthritis, Neuropathy 02/05/2021] [18:Disease (COPD), Sleep Apnea, Arrhythmia, Congestive Heart Failure,  Coronary Artery Disease, Hypertension, Peripheral Arterial Disease, Cirrhosis , Type II Diabetes, Osteoarthritis, Neuropathy 02/05/2021] Date Acquired: [16:0] [17:0] [18:0] Weeks of Treatment: [16:Open] [17:Open] [18:Open] Wound Status: [16:1.3x1.6x0.3] [17:6.4x5.5x0.1] [18:5.5x6.4x0.1] Measurements L x W x D (cm) [16:1.634] [id number] [18:27.646] A (cm) : rea [16:0.49] [17:2.765] [18:2.765] Volume (cm) : [16:Grade 2] [17:Grade 2] [18:Grade 2] Classification: [16:Medium] [17:Medium] [18:Large] Exudate A mount: [16:Serosanguineous] [17:Serosanguineous] [18:Serosanguineous] Exudate Type:  [16:red, brown] [17:red, brown] [18:red, brown] Exudate Color: [16:Distinct, outline attached] [17:Distinct, outline attached] [18:Distinct, outline attached] Wound Margin: [16:None Present (0%)] [17:Large (67-100%)] [18:Large (67-100%)] Granulation A mount: [16:N/A] [17:Red, Pink] [18:Red, Pink] Granulation Quality: [16:Large (67-100%)] [17:N/A] [18:Small (1-33%)] Necrotic A mount: [16:Eschar, Adherent Slough] [17:N/A] [18:Eschar, Adherent Slough] Necrotic Tissue: [16:Fascia: No] [17:Fascia: No] [18:Fascia: No] Exposed Structures: [16:Fat Layer (Subcutaneous Tissue): No Tendon: No Muscle: No Joint: No Bone: No None] [17:Fat Layer (Subcutaneous Tissue): No Tendon: No Muscle: No Joint: No Bone: No N/A] [18:Fat Layer (Subcutaneous Tissue): No Tendon: No Muscle: No Joint: No Bone: No  None] Treatment Notes 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 Peri-Wound Care Topical Primary Dressing IODOFLEX 0.9% Cadexomer Iodine Pad 4x6 cm Discharge Instruction: Apply to wound bed as instructed Secondary Dressing Woven Gauze Sponge, Non-Sterile 4x4 in Discharge Instruction: Apply over primary dressing as directed. Secured With Compression Wrap Kerlix Roll 4.5x3.1 (in/yd) Discharge Instruction: Apply Kerlix and Coban compression as directed. Coban Self-Adherent Wrap 4x5 (in/yd) Discharge Instruction: Apply over Kerlix as directed. Compression Stockings Add-Ons Wound #17 (Lower Leg) Wound Laterality: Right, Medial Cleanser Soap and Water Discharge Instruction: May shower and wash wound with dial antibacterial soap and water prior to dressing change. Peri-Wound Care Topical Primary Dressing KerraCel Ag Gelling Fiber Dressing, 4x5 in (silver alginate) Discharge Instruction: Apply silver alginate to wound bed as  instructed Secondary Dressing Woven Gauze Sponge, Non-Sterile 4x4 in Discharge Instruction: Apply over primary dressing as directed. ABD Pad, 8x10 Discharge Instruction: Apply over primary dressing as directed. Secured With Compression Wrap Kerlix Roll 4.5x3.1 (in/yd) Discharge Instruction: Apply Kerlix and Coban compression as directed. Coban Self-Adherent Wrap 4x5 (in/yd) Discharge Instruction: Apply over Kerlix as directed. Compression Stockings Add-Ons Wound #18 (Lower Leg) Wound Laterality: Right, Anterior Cleanser Soap and Water Discharge Instruction: May shower and wash wound with dial antibacterial soap and water prior to dressing change. Peri-Wound Care Topical Primary Dressing KerraCel Ag Gelling Fiber Dressing, 4x5 in (silver alginate) Discharge Instruction: Apply silver alginate to wound bed as instructed Secondary Dressing Woven Gauze Sponge, Non-Sterile 4x4 in Discharge Instruction: Apply over primary dressing as directed. ABD Pad, 8x10 Discharge Instruction: Apply over primary dressing as directed. Secured With Compression Wrap Kerlix Roll 4.5x3.1 (in/yd) Discharge Instruction: Apply Kerlix and Coban compression as directed. Coban Self-Adherent Wrap 4x5 (in/yd) Discharge Instruction: Apply over Kerlix as directed. Compression Stockings Add-Ons Electronic Signature(s) Signed: 02/19/2021 4:45:31 PM By: Linton Ham MD Signed: 02/19/2021 5:03:53 PM By: Lorrin Jackson Entered By: Linton Ham on 02/19/2021 12:20:46 -------------------------------------------------------------------------------- Multi-Disciplinary Care Plan Details Patient Name: Date of Service: [name], [name] 02/19/2021 9:00 A M Medical  Record Number: [id number] Patient Account Number: [id number] Date of Birth/Sex: Treating RN: [birth year] (60 y.o. [name] Primary Care Karyssa Amaral: [name] Other Clinician: Referring Yee Gangi: Treating Xaidyn Kepner/Extender: [name]: 0 Active Inactive Abuse / Safety / Falls / Self Care Management Nursing Diagnoses: History of Falls Goals: Patient will not experience any injury related to falls Date Initiated: 02/19/2021 Target Resolution Date: 03/26/2021 Goal Status: Active Interventions: Assess Activities of Daily Living upon admission and as needed Assess fall risk on admission and as needed Provide education on fall prevention Notes: Nutrition Nursing Diagnoses: Impaired glucose control: actual or potential Goals: Patient/caregiver verbalizes understanding of need to maintain therapeutic glucose control per primary care physician Date Initiated: 02/19/2021 Target Resolution Date: 03/26/2021 Goal Status: Active Interventions: Assess patient nutrition upon admission and as needed per policy Provide education on elevated blood sugars and impact on wound healing Provide education on nutrition Notes: Orientation to the Wound Care Program Nursing Diagnoses: Knowledge deficit related to the wound healing center program Goals: Patient/caregiver will verbalize understanding of the [facility] Date Initiated: 02/19/2021 Target Resolution Date: 03/19/2021 Goal Status: Active Interventions: Provide education on orientation to the wound center Notes: Wound/Skin Impairment Nursing Diagnoses: Impaired tissue integrity Goals: Patient/caregiver will verbalize understanding of skin care regimen Date Initiated: 02/19/2021 Target Resolution Date: 03/19/2021 Goal Status: Active Ulcer/skin breakdown will have a volume reduction of 30% by week 4 Date Initiated: 02/19/2021 Target Resolution Date: 03/19/2021 Goal Status: Active Interventions: Assess patient/caregiver ability to obtain necessary supplies Assess patient/caregiver ability to perform ulcer/skin care regimen upon admission and as needed Assess ulceration(s) every visit Provide education on ulcer and skin care Treatment  Activities: Skin care regimen initiated : 02/19/2021 Topical wound management initiated : 02/19/2021 Notes: Electronic Signature(s) Signed: 02/19/2021 5:03:53 PM By: Lorrin Jackson Previous Signature: 02/19/2021 9:49:15 AM Version By: Lorrin Jackson Entered By: Lorrin Jackson on 02/19/2021 10:15:54 -------------------------------------------------------------------------------- Pain Assessment Details Patient Name: Date of Service: [name], [name] 02/19/2021 9:00 A M Medical Record Number: [id number] Patient Account Number: [id number] Date of Birth/Sex: Treating RN: 1961-07-17 (60 y.o. Tonita Phoenix, Lauren Primary Care Niasia Lanphear: [name] Other Clinician: Referring Rosemary Mossbarger: Treating Rhondalyn Clingan/Extender: [name]: 0 Active Problems Location of Pain Severity and Description of Pain Patient Has Paino Yes Site Locations Pain Location: Generalized Pain, Pain in Ulcers With Dressing Change: Yes Duration of the Pain. Constant / Intermittento Intermittent Rate the pain. Current Pain Level: 10 Worst Pain Level: 10 Least Pain Level: 0 Tolerable Pain Level: 10 Character of Pain Describe the Pain: Aching Pain Management and Medication Current Pain Management: Medication: Yes Cold Application: No Rest: Yes Massage: No Activity: No T.E.N.S.: No Heat Application: No Leg drop or elevation: No Is the Current Pain Management Adequate: Adequate How does your wound impact your activities of daily livingo Sleep: No Bathing: No Appetite: No Relationship With Others: No Bladder Continence: No Emotions: No Bowel Continence: No Work: No Toileting: No Drive: No Dressing: No Hobbies: No Electronic Signature(s) Signed: 02/22/2021 5:44:40 PM By: [name] RN Entered By: [name] on 02/19/2021 09:20:56 -------------------------------------------------------------------------------- Patient/Caregiver Education Details Patient Name: Date of  Service: HA LEY, [name] 4/8/2022andnbsp9:00 [facility] Record Number: [id number] Patient Account Number: [id number] Date of Birth/Gender: Treating RN: 1961/08/18 (60 y.o. [name] Primary Care Physician: [name] Other Clinician: Referring Physician: Treating Physician/Extender: [name]: 0 Education Assessment Education Provided To: Patient Education Topics Provided Elevated Blood  Sugar/ Impact on Healing: Methods: Explain/Verbal, Printed Responses: State content correctly Smoking and Wound Healing: Methods: Explain/Verbal, Printed Responses: State content correctly [place]: o Methods: Explain/Verbal, Printed Responses: State content correctly Wound/Skin Impairment: Methods: Demonstration, Explain/Verbal, Printed Responses: State content correctly Electronic Signature(s) Signed: 02/19/2021 5:03:53 PM By: Lorrin Jackson Entered By: Lorrin Jackson on 02/19/2021 10:15:00 -------------------------------------------------------------------------------- Wound Assessment Details Patient Name: Date of Service: [name], [name] 02/19/2021 9:00 A M Medical Record Number: [id number] Patient Account Number: [id number] Date of Birth/Sex: Treating RN: 04/21/1961 (59 y.o. Tonita Phoenix, Lauren Primary Care Fields Oros: [name] Other Clinician: Referring Kadir Azucena: Treating Braelyn Bordonaro/Extender: [name]: 0 Wound Status Wound Number: 16 Primary Diabetic Wound/Ulcer of the Lower Extremity Etiology: Wound Location: Right, Plantar Amputation Site - Transmetatarsal Wound Open Wounding Event: Gradually Appeared Status: Date Acquired: 02/05/2021 Comorbid Chronic Obstructive Pulmonary Disease (COPD), Sleep Apnea, Weeks Of Treatment: 0 History: Arrhythmia, Congestive Heart Failure, Coronary Artery Disease, Clustered Wound: No Hypertension, Peripheral Arterial Disease, Cirrhosis , Type II Diabetes,  Osteoarthritis, Neuropathy Photos Wound Measurements Length: (cm) 1.3 Width: (cm) 1.6 Depth: (cm) 0.3 Area: (cm) 1.634 Volume: (cm) 0.49 % Reduction in Area: 0% % Reduction in Volume: 0% Epithelialization: None Tunneling: No Undermining: No Wound Description Classification: Grade 2 Wound Margin: Distinct, outline attached Exudate Amount: Medium Exudate Type: Serosanguineous Exudate Color: red, brown Foul Odor After Cleansing: No Slough/Fibrino Yes Wound Bed Granulation Amount: None Present (0%) Exposed Structure Necrotic Amount: Large (67-100%) Fascia Exposed: No Necrotic Quality: Eschar, Adherent Slough Fat Layer (Subcutaneous Tissue) Exposed: No Tendon Exposed: No Muscle Exposed: No Joint Exposed: No Bone Exposed: No Treatment Notes 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 Peri-Wound Care Topical Primary Dressing IODOFLEX 0.9% Cadexomer Iodine Pad 4x6 cm Discharge Instruction: Apply to wound bed as instructed Secondary Dressing Woven Gauze Sponge, Non-Sterile 4x4 in Discharge Instruction: Apply over primary dressing as directed. Secured With Compression Wrap Kerlix Roll 4.5x3.1 (in/yd) Discharge Instruction: Apply Kerlix and Coban compression as directed. Coban Self-Adherent Wrap 4x5 (in/yd) Discharge Instruction: Apply over Kerlix as directed. Compression Stockings Add-Ons Electronic Signature(s) Signed: 02/22/2021 7:58:59 AM By: Sandre Kitty Signed: 02/22/2021 5:44:40 PM By: [name] RN Entered By: Sandre Kitty on 02/19/2021 16:46:37 -------------------------------------------------------------------------------- Wound Assessment Details Patient Name: Date of Service: [name], [name] 02/19/2021 9:00 A M Medical Record Number: [id number] Patient Account Number:  [id number] Date of Birth/Sex: Treating RN: [birth year] (60 y.o. Tonita Phoenix, Lauren Primary Care Priyal Musquiz: [name] Other Clinician: Referring Khrystyne Arpin: Treating Alverta Caccamo/Extender: [name]: 0 Wound Status Wound Number: 17 Primary Diabetic Wound/Ulcer of the Lower Extremity Etiology: Wound Location: Right, Medial Lower Leg Wound Open Wounding Event: Blister Status: Date Acquired: 02/05/2021 Comorbid Chronic Obstructive Pulmonary Disease (COPD), Sleep Apnea, Weeks Of Treatment: 0 History: Arrhythmia, Congestive Heart Failure, Coronary Artery Disease, Clustered Wound: No Hypertension, Peripheral Arterial Disease, Cirrhosis , Type II Diabetes, Osteoarthritis, Neuropathy Photos Wound Measurements Length: (cm) 6.4 Width: (cm) 5.5 Depth: (cm) 0.1 Area: (cm) 27.646 Volume: (cm) 2.765 % Reduction in Area: 0% % Reduction in Volume: 0% Tunneling: No Undermining: No Wound Description Classification: Grade 2 Wound Margin: Distinct, outline attached Exudate Amount: Medium Exudate Type: Serosanguineous Exudate Color: red, brown Foul Odor After Cleansing: No Slough/Fibrino No Wound Bed Granulation Amount: Large (67-100%) Exposed Structure Granulation Quality: Red, Pink Fascia Exposed: No  Fat Layer (Subcutaneous Tissue) Exposed: No Tendon Exposed: No Muscle Exposed: No Joint Exposed: No Bone Exposed: No Treatment Notes Wound #17 (Lower Leg) Wound Laterality: Right, Medial Cleanser Soap and Water Discharge Instruction: May shower and wash wound with dial antibacterial soap and water prior to dressing change. Peri-Wound Care Topical Primary Dressing KerraCel Ag Gelling Fiber Dressing, 4x5 in (silver alginate) Discharge Instruction: Apply silver alginate to wound bed as instructed Secondary Dressing Woven Gauze Sponge, Non-Sterile 4x4 in Discharge Instruction: Apply over primary dressing as directed. ABD Pad, 8x10 Discharge Instruction:  Apply over primary dressing as directed. Secured With Compression Wrap Kerlix Roll 4.5x3.1 (in/yd) Discharge Instruction: Apply Kerlix and Coban compression as directed. Coban Self-Adherent Wrap 4x5 (in/yd) Discharge Instruction: Apply over Kerlix as directed. Compression Stockings Add-Ons Electronic Signature(s) Signed: 02/22/2021 7:58:59 AM By: Sandre Kitty Signed: 02/22/2021 5:44:40 PM By: [name] RN Entered By: Sandre Kitty on 02/19/2021 16:48:28 -------------------------------------------------------------------------------- Wound Assessment Details Patient Name: Date of Service: [name], [name] 02/19/2021 9:00 A M Medical Record Number: [id number] Patient Account Number: [id number] Date of Birth/Sex: Treating RN: 1961/09/23 (60 y.o. Tonita Phoenix, Lauren Primary Care Assyria Morreale: [name] Other Clinician: Referring Faelyn Sigler: Treating Laverda Stribling/Extender: [name]: 0 Wound Status Wound Number: 18 Primary Diabetic Wound/Ulcer of the Lower Extremity Etiology: Wound Location: Right, Anterior Lower Leg Wound Open Wounding Event: Trauma Status: Date Acquired: 02/05/2021 Comorbid Chronic Obstructive Pulmonary Disease (COPD), Sleep Apnea, Weeks Of Treatment: 0 History: Arrhythmia, Congestive Heart Failure, Coronary Artery Disease, Clustered Wound: No Hypertension, Peripheral Arterial Disease, Cirrhosis , Type II Diabetes, Osteoarthritis, Neuropathy Photos Wound Measurements Length: (cm) 5.5 Width: (cm) 6.4 Depth: (cm) 0.1 Area: (cm) 27.646 Volume: (cm) 2.765 % Reduction in Area: 0% % Reduction in Volume: 0% Epithelialization: None Tunneling: No Undermining: No Wound Description Classification: Grade 2 Wound Margin: Distinct, outline attached Exudate Amount: Large Exudate Type: Serosanguineous Exudate Color: red, brown Foul Odor After Cleansing: No Slough/Fibrino Yes Wound Bed Granulation Amount: Large (67-100%)  Exposed Structure Granulation Quality: Red, Pink Fascia Exposed: No Necrotic Amount: Small (1-33%) Fat Layer (Subcutaneous Tissue) Exposed: No Necrotic Quality: Eschar, Adherent Slough Tendon Exposed: No Muscle Exposed: No Joint Exposed: No Bone Exposed: No Treatment Notes Wound #18 (Lower Leg) Wound Laterality: Right, Anterior Cleanser Soap and Water Discharge Instruction: May shower and wash wound with dial antibacterial soap and water prior to dressing change. Peri-Wound Care Topical Primary Dressing KerraCel Ag Gelling Fiber Dressing, 4x5 in (silver alginate) Discharge Instruction: Apply silver alginate to wound bed as instructed Secondary Dressing Woven Gauze Sponge, Non-Sterile 4x4 in Discharge Instruction: Apply over primary dressing as directed. ABD Pad, 8x10 Discharge Instruction: Apply over primary dressing as directed. Secured With Compression Wrap Kerlix Roll 4.5x3.1 (in/yd) Discharge Instruction: Apply Kerlix and Coban compression as directed. Coban Self-Adherent Wrap 4x5 (in/yd) Discharge Instruction: Apply over Kerlix as directed. Compression Stockings Add-Ons Electronic Signature(s) Signed: 02/22/2021 7:58:59 AM By: Sandre Kitty Signed: 02/22/2021 5:44:40 PM By: [name] RN Entered By: Sandre Kitty on 02/19/2021 16:48:52 -------------------------------------------------------------------------------- Vitals Details Patient Name: Date of Service: [name], [name] 02/19/2021 9:00 A M Medical Record Number: [id number] Patient Account Number: [id number] Date of Birth/Sex: Treating RN: 01/26/1961 (60 y.o. Tonita Phoenix, Lauren Primary Care Naphtali Zywicki: [name] Other Clinician: Referring Tobi Leinweber: Treating Bentlee Drier/Extender: [name]: 0 Vital Signs Time Taken: 09:21 Temperature (F): 98.5 Pulse (bpm): 67 Respiratory Rate (breaths/min): 17 Blood Pressure (mmHg): 152/72 Capillary Blood Glucose (mg/dl):  198 Reference Range: 80 - 120  mg / dl Electronic Signature(s) Signed: 02/22/2021 5:44:40 PM By: [name] RN Entered By: [name] on 02/19/2021 [id number]

## 2021-02-22 NOTE — Progress Notes (Signed)
[name], [name] ([id number]) Visit Report for 02/19/2021 HPI Details Patient Name: Date of Service: HA Maretta Price 02/19/2021 9:00 A M Medical Record Number: [id number] Patient Account Number: [id number] Date of Birth/Sex: Treating RN: [birth year] (60 y.o. [name] Primary Care Provider: [name] Other Clinician: Referring Provider: Treating Provider/Extender: [name]: 0 History of Present Illness HPI Description: This 60 year old patient who has a very long significant history of diabetes mellitus, previous alcohol and nicotine abuse, chronic data disease, COPD, diabetes mellitus, height hypertension, critical lower limb ischemia with several wounds being managed at the wound Center at [facility] for over a year. She was recently in the ER at [facility] and was referred to our center. He has had a long history of critical limb ischemia and over a period of time has had balloon angioplasties in March 2016, endarterectomy of the left carotid, lower extremity angiogram and treatment by Dr. Quay Burow, several cardiac catheterizations. Most recently she had an x-ray of her left foot while in the ER which showed no acute abnormality. during this ER visit she was started on ciprofloxacin and asked to continue with the wound care physicians at [facility]. Her last ABI done in June 2017 showed the right side to be 0.28 in the left side to be 0.48. Her right toe brachial index was 0.17 on the right and 0.27 on the left. her last hemoglobin A1c was 11.1% She continues to smoke about a pack of cigarettes a day. 03/28/2017 -- -- right foot x-ray -- IMPRESSION:Areas of soft tissue swelling. Mild subluxation second PIP joint. No frank dislocation or fracture. No erosive change or bony destruction. No soft tissue ulceration or radiopaque foreign body evident by radiography. There is plantar fascia calcification with a nearby inferior calcaneal spur. There  are foci of arterial vascular calcification. 04/04/2017 -- the patient continues to be noncompliant and continues to complain of a lot of pain and has not done anything about quitting smoking Readmission: 06/26/18 on evaluation today patient presents for reevaluation she has not been seen in our office since May 2018. Since she was last seen here in our office she has undergone testing at Dr. Elspeth Cho office where it was revealed that she had an ABI of 0.68 with her previous ABI being 0.64 and a left ABI of 0.38 with previous ABI being 0.34 this study which was performed on 04/05/18 was pretty much equivalent to the study performed on 11/22/17. The findings in the end revealed on the right would appear to be moderate right lower extremity arterial disease on the left Dr. Gwenlyn Found states moderate in the report but unfortunately this appears to be much more severe at 0.38 compared to the right. Subsequently the patient was scheduled to have angiography with Dr. Gwenlyn Found on 04/12/18. This was however canceled due to the fact that the patient was found to have chronic kidney disease stage III and it was to the point that he did not feel that it will be safe to pursue angiography at that point. She has not been on any antibiotics recently. At this point Dr. Gwenlyn Found has not rescheduled anything as far as the injured [place] according to the patient he is somewhat reluctant to do so. Nonetheless with her diminished blood flow this is gonna make it somewhat difficult for her to heal. 07/05/18; this is a patient I have not seen previously. She has very significant PAD as noted above. She apparently has had  revascularization efforts by Dr. Gwenlyn Found on the right on 3 occasions to the patient. She was supposed to have an attempted angiography on the left however this was canceled apparently because of stage III chronic renal failure. I will need to research all of this. She complains of significant pain in the wound and has  claudication enough that when she walks to the end of the driveway she has to stop. She is been using silver alginate to the wounds on her legs and Iodoflex to the area on her left second toe. 07/13/18 on evaluation today patient's wounds actually appear to be doing about the same. She has an appointment she tells me within the next month that is September 2019 with Dr. Gwenlyn Found to discuss options to see if there's anything else that you can recommend or do for her. Nonetheless obviously what we're trying to do is do what we can to save her leg and in turn prevent any additional worsening or damage. None in the meantime we been mainly trying to manage her ulcers as best we can. 07/27/18; some improvement in the multiplicity of wounds on her left lower extremity and foot. She's been using silver alginate. I was unable to determine that she actually has an appointment with Dr. Gwenlyn Found. We are checking into this The patient's wound includes Left lateral foot, left plantar heel, her left anterior calf wound looks close to me, left fourth toe is very close to closed and the left medial calf is perhaps the largest open area READMISSION 02/19/2021 This is a now 60 year old woman with type 2 diabetes and continued cigarette smoking. She has known PAD. She was last in this clinic in 2019 at that point with wounds on her left foot. She left in a nonhealed state. On 06/28/2019 I see she had a left femoropopliteal by vein and vascular with an amputation of the fifth ray. These managed to heal over. Her last arterial studies were in February 2021. This showed an absent waveform at the posterior tibial artery on the right a dampened monophasic dorsalis pedis on the right of 0.44. On the left again the PTA TA was absent her dorsalis pedis was 0.99 triphasic and her great toe pressure was 0.65. This would have been after her revascularization. Once again she tells me that Dr. Gwenlyn Found has done revascularization on the right  leg on 3 different occasions. She has a right transmetatarsal amputation apparently done by Dr. Sharol Given remotely but I do not see a note for these right lower extremity revascularization but I may not of looked back far enough. In any case she says that the she has a wound on the plantar aspect of the right transmetatarsal amputation site there is been there for several months. More recently she fell and had a wound on her right medial lower leg she had 5 sutures placed in the ER and then subsequently she has developed an area on the medial lower leg which was a blister that opened up. Past medical history includes type 2 diabetes, PAD, chronic renal failure, congestive heart failure, right transmetatarsal amputation, left fifth ray amputation, continued tobacco abuse, atrial fibrillation and cirrhosis. We did not attempt an ABI on the right leg today because of pain Electronic Signature(s) Signed: 02/19/2021 4:45:31 PM By: Linton Ham MD Entered By: Linton Ham on 02/19/2021 12:25:15 -------------------------------------------------------------------------------- Physical Exam Details Patient Name: Date of Service: [name], [name] 02/19/2021 9:00 [place] Record Number: [id number] Patient Account Number: [id number] Date of  Birth/Sex: Treating RN: [birth year] (60 y.o. [name] Primary Care Provider: [name] Other Clinician: Referring Provider: Treating Provider/Extender: [name]: 0 Constitutional Patient is hypertensive.. Pulse regular and within target range for patient.[name] Respirations regular, non-labored and within target range.. Temperature is normal and within the target range for the patient.[name] Appears in no distress. Respiratory work of breathing is normal. Reduced air entry prolonged expiratory phase and wheezing. Cardiovascular No murmurs JVP not elevated no evidence of CHF. Cannot feel popliteal or femoral pulses either. I cannot feel  her pedal pulses on the right. Nonpitting edema in both lower legs. Integumentary (Hair, Skin) What appears to be lymphedema in her bilateral lower legs. Psychiatric appears at normal baseline. Notes Wound exam; the patient has 3 wounds. She has an area over her first met head plantar on the right. This is a dime sized wound with adherent fibrinous eschar, pending my review of her vascular status I did not debride this but we will try to place something that will help remove the surface She has the laceration injury on the right anterior lower leg I removed her 5 sutures. She has an area of dehiscence in the wound medially here. None of this look too bad. Wash this off with Anasept and gauze. No mechanical debridement was necessary She has what she says was a blister on the right medial lower leg obviously this is unroofed to reveal a superficial but fairly large wound. No evidence of infection Electronic Signature(s) Signed: 02/19/2021 4:45:31 PM By: Linton Ham MD Entered By: Linton Ham on 02/19/2021 12:30:24 -------------------------------------------------------------------------------- Physician Orders Details Patient Name: Date of Service: [name], [name] 02/19/2021 9:00 A M Medical Record Number: [id number] Patient Account Number: [id number] Date of Birth/Sex: Treating RN: Dec 31, 1960 (60 y.o. [name] Primary Care Provider: [name] Other Clinician: Referring Provider: Treating Provider/Extender: [name]: 0 Verbal / Phone Orders: No Diagnosis Coding Follow-up Appointments Return Appointment in 1 week. Bathing/ Shower/ Hygiene May shower with protection but do not get wound dressing(s) wet. Edema Control - Lymphedema / SCD / Other Elevate legs to the level of the heart or above for 30 minutes daily and/or when sitting, a frequency of: Avoid standing for long periods of time. Exercise regularly Off-Loading Open toe surgical  shoe to: - with felt padding to offload Additional Orders / Instructions Stop/Decrease Smoking Follow Nutritious Diet Wound Treatment Wound #16 - Amputation Site - Transmetatarsal Wound Laterality: Plantar, Right Cleanser: Soap and Water 1 x Per Week/15 Days Discharge Instructions: May shower and wash wound with dial antibacterial soap and water prior to dressing change. Prim Dressing: IODOFLEX 0.9% Cadexomer Iodine Pad 4x6 cm 1 x Per Week/15 Days ary Discharge Instructions: Apply to wound bed as instructed Secondary Dressing: Woven Gauze Sponge, Non-Sterile 4x4 in 1 x Per Week/15 Days Discharge Instructions: Apply over primary dressing as directed. Compression Wrap: Kerlix Roll 4.5x3.1 (in/yd) 1 x Per Week/15 Days Discharge Instructions: Apply Kerlix and Coban compression as directed. Compression Wrap: Coban Self-Adherent Wrap 4x5 (in/yd) 1 x Per Week/15 Days Discharge Instructions: Apply over Kerlix as directed. Wound #17 - Lower Leg Wound Laterality: Right, Medial Cleanser: Soap and Water 1 x Per Week Discharge Instructions: May shower and wash wound with dial antibacterial soap and water prior to dressing change. Prim Dressing: KerraCel Ag Gelling Fiber Dressing, 4x5 in (silver alginate) 1 x Per Week ary Discharge Instructions: Apply silver alginate to wound bed as instructed Secondary Dressing:  Woven Gauze Sponge, Non-Sterile 4x4 in 1 x Per Week Discharge Instructions: Apply over primary dressing as directed. Secondary Dressing: ABD Pad, 8x10 1 x Per Week Discharge Instructions: Apply over primary dressing as directed. Compression Wrap: Kerlix Roll 4.5x3.1 (in/yd) 1 x Per Week Discharge Instructions: Apply Kerlix and Coban compression as directed. Compression Wrap: Coban Self-Adherent Wrap 4x5 (in/yd) 1 x Per Week Discharge Instructions: Apply over Kerlix as directed. Wound #18 - Lower Leg Wound Laterality: Right, Anterior Cleanser: Soap and Water 1 x Per Week Discharge  Instructions: May shower and wash wound with dial antibacterial soap and water prior to dressing change. Prim Dressing: KerraCel Ag Gelling Fiber Dressing, 4x5 in (silver alginate) 1 x Per Week ary Discharge Instructions: Apply silver alginate to wound bed as instructed Secondary Dressing: Woven Gauze Sponge, Non-Sterile 4x4 in 1 x Per Week Discharge Instructions: Apply over primary dressing as directed. Secondary Dressing: ABD Pad, 8x10 1 x Per Week Discharge Instructions: Apply over primary dressing as directed. Compression Wrap: Kerlix Roll 4.5x3.1 (in/yd) 1 x Per Week Discharge Instructions: Apply Kerlix and Coban compression as directed. Compression Wrap: Coban Self-Adherent Wrap 4x5 (in/yd) 1 x Per Week Discharge Instructions: Apply over Kerlix as directed. Electronic Signature(s) Signed: 02/19/2021 4:45:31 PM By: Linton Ham MD Signed: 02/19/2021 5:03:53 PM By: Lorrin Jackson Entered By: Lorrin Jackson on 02/19/2021 10:24:54 -------------------------------------------------------------------------------- Problem List Details Patient Name: Date of Service: [name], [name] 02/19/2021 9:00 A M Medical Record Number: [id number] Patient Account Number: [id number] Date of Birth/Sex: Treating RN: Jun 09, 1961 (60 y.o. [name] Primary Care Provider: [name] Other Clinician: Referring Provider: Treating Provider/Extender: [name]: 0 Active Problems ICD-10 Encounter Code Description Active Date MDM Diagnosis 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 2 diabetes mellitus with foot ulcer 02/19/2021 No Yes L97.518 Non-pressure chronic ulcer of other part of right foot with other specified 02/19/2021 No Yes severity E11.51 Type 2 diabetes mellitus with diabetic peripheral angiopathy without gangrene 02/19/2021 No Yes L97.811 Non-pressure chronic ulcer of other part of right lower leg limited to breakdown 02/19/2021 No Yes of skin Inactive Problems Resolved Problems Electronic  Signature(s) Signed: 02/19/2021 4:45:31 PM By: Linton Ham MD Entered By: Linton Ham on 02/19/2021 10:30:52 -------------------------------------------------------------------------------- Progress Note Details Patient Name: Date of Service: [name], [name] 02/19/2021 9:00 A M Medical Record Number: [id number] Patient Account Number: [id number] Date of Birth/Sex: Treating RN: [birth year] (60 y.o. [name] Primary Care Provider: [name] Other Clinician: Referring Provider: Treating Provider/Extender: [name]: 0 Subjective History of Present Illness (HPI) This 60 year old patient who has a very long significant history of diabetes mellitus, previous alcohol and nicotine abuse, chronic data disease, COPD, diabetes mellitus, height hypertension, critical lower limb ischemia with several wounds being managed at the wound Center at [facility] for over a year. She was recently in the ER at [facility] and was referred to our center. He has had a long history of critical limb ischemia and over a period of time has had balloon angioplasties in March 2016, endarterectomy of the left carotid, lower extremity angiogram and treatment by Dr. Quay Burow, several cardiac catheterizations. Most recently she had an x-ray of her left foot while in the ER which showed no acute abnormality. during this ER visit she was started on ciprofloxacin and asked to continue with the wound care physicians at [facility]. Her last ABI done in June 2017 showed the right side to be 0.28 in the left side  to be 0.48. Her right toe brachial index was 0.17 on the right and 0.27 on the left. her last hemoglobin A1c was 11.1% She continues to smoke about a pack of cigarettes a day. 03/28/2017 -- -- right foot x-ray -- IMPRESSION:Areas of soft tissue swelling. Mild subluxation second PIP joint. No frank dislocation or fracture. No erosive change or bony destruction. No  soft tissue ulceration or radiopaque foreign body evident by radiography. There is plantar fascia calcification with a nearby inferior calcaneal spur. There are foci of arterial vascular calcification. 04/04/2017 -- the patient continues to be noncompliant and continues to complain of a lot of pain and has not done anything about quitting smoking Readmission: 06/26/18 on evaluation today patient presents for reevaluation she has not been seen in our office since May 2018. Since she was last seen here in our office she has undergone testing at Dr. Fransico Rorik Vespa office where it was revealed that she had an ABI of 0.68 with her previous ABI being 0.64 and a left ABI of 0.38 with previous ABI being 0.34 this study which was performed on 04/05/18 was pretty much equivalent to the study performed on 11/22/17. The findings in the end revealed on the right would appear to be moderate right lower extremity arterial disease on the left Dr. Gwenlyn Found states moderate in the report but unfortunately this appears to be much more severe at 0.38 compared to the right. Subsequently the patient was scheduled to have angiography with Dr. Gwenlyn Found on 04/12/18. This was however canceled due to the fact that the patient was found to have chronic kidney disease stage III and it was to the point that he did not feel that it will be safe to pursue angiography at that point. She has not been on any antibiotics recently. At this point Dr. Gwenlyn Found has not rescheduled anything as far as the injured [place] according to the patient he is somewhat reluctant to do so. Nonetheless with her diminished blood flow this is gonna make it somewhat difficult for her to heal. 07/05/18; this is a patient I have not seen previously. She has very significant PAD as noted above. She apparently has had revascularization efforts by Dr. Gwenlyn Found on the right on 3 occasions to the patient. She was supposed to have an attempted angiography on the left however this  was canceled apparently because of stage III chronic renal failure. I will need to research all of this. She complains of significant pain in the wound and has claudication enough that when she walks to the end of the driveway she has to stop. She is been using silver alginate to the wounds on her legs and Iodoflex to the area on her left second toe. 07/13/18 on evaluation today patient's wounds actually appear to be doing about the same. She has an appointment she tells me within the next month that is September 2019 with Dr. Gwenlyn Found to discuss options to see if there's anything else that you can recommend or do for her. Nonetheless obviously what we're trying to do is do what we can to save her leg and in turn prevent any additional worsening or damage. None in the meantime we been mainly trying to manage her ulcers as best we can. 07/27/18; some improvement in the multiplicity of wounds on her left lower extremity and foot. She's been using silver alginate. I was unable to determine that she actually has an appointment with Dr. Gwenlyn Found. We are checking into this The patient's wound includes  Left lateral foot, left plantar heel, her left anterior calf wound looks close to me, left fourth toe is very close to closed and the left medial calf is perhaps the largest open area READMISSION 02/19/2021 This is a now 60 year old woman with type 2 diabetes and continued cigarette smoking. She has known PAD. She was last in this clinic in 2019 at that point with wounds on her left foot. She left in a nonhealed state. On 06/28/2019 I see she had a left femoropopliteal by vein and vascular with an amputation of the fifth ray. These managed to heal over. Her last arterial studies were in February 2021. This showed an absent waveform at the posterior tibial artery on the right a dampened monophasic dorsalis pedis on the right of 0.44. On the left again the PTA TA was absent her dorsalis pedis was 0.99 triphasic and her  great toe pressure was 0.65. This would have been after her revascularization. Once again she tells me that Dr. Gwenlyn Found has done revascularization on the right leg on 3 different occasions. She has a right transmetatarsal amputation apparently done by Dr. Sharol Given remotely but I do not see a note for these right lower extremity revascularization but I may not of looked back far enough. In any case she says that the she has a wound on the plantar aspect of the right transmetatarsal amputation site there is been there for several months. More recently she fell and had a wound on her right medial lower leg she had 5 sutures placed in the ER and then subsequently she has developed an area on the medial lower leg which was a blister that opened up. Past medical history includes type 2 diabetes, PAD, chronic renal failure, congestive heart failure, right transmetatarsal amputation, left fifth ray amputation, continued tobacco abuse, atrial fibrillation and cirrhosis. We did not attempt an ABI on the right leg today because of pain Patient History Information obtained from Patient. Allergies gabapentin (Severity: Moderate, Reaction: Shakes), Lyrica Family History Cancer - Mother, Diabetes - Mother, Heart Disease - Mother,Father, Hypertension - Mother,Father, Lung Disease - Siblings, Stroke - Mother, Thyroid Problems - Mother, No family history of Hereditary Spherocytosis, Kidney Disease, Seizures, Tuberculosis. Social History Former smoker, Marital Status - Divorced, Alcohol Use - Never, Drug Use - Current History - marijuana, Caffeine Use - Daily - Coffee. Medical History Eyes Denies history of Cataracts, Glaucoma, Optic Neuritis Ear/Nose/Mouth/Throat Denies history of Chronic sinus problems/congestion, Middle ear problems Hematologic/Lymphatic Denies history of Anemia, Hemophilia, Human Immunodeficiency Virus, Lymphedema, Sickle Cell Disease Respiratory Patient has history of Chronic Obstructive  Pulmonary Disease (COPD), Sleep Apnea Denies history of Aspiration, Asthma, Pneumothorax, Tuberculosis Cardiovascular Patient has history of Arrhythmia - NSVT , PAF , PAT Congestive Heart Failure - chronic combined, Coronary Artery Disease, Hypertension, Peripheral , Arterial Disease Denies history of Angina, Deep Vein Thrombosis, Hypotension, Myocardial Infarction, Peripheral Venous Disease, Phlebitis, Vasculitis Gastrointestinal Patient has history of Cirrhosis - alcoholic Denies history of Colitis, Crohnoos, Hepatitis A, Hepatitis B, Hepatitis C Endocrine Patient has history of Type II Diabetes Denies history of Type I Diabetes Genitourinary Denies history of End Stage Renal Disease Immunological Denies history of Lupus Erythematosus, Raynaudoos, Scleroderma Integumentary (Skin) Denies history of History of Burn Musculoskeletal Patient has history of Osteoarthritis Denies history of Gout, Rheumatoid Arthritis, Osteomyelitis Neurologic Patient has history of Neuropathy - dm2 peripheral Denies history of Dementia, Quadriplegia, Paraplegia, Seizure Disorder Oncologic Denies history of Received Chemotherapy, Received Radiation Psychiatric Denies history of Anorexia/bulimia, Confinement Anxiety Hospitalization/Surgery History -  balloon angioplasty. - cardiac cath X2. - cardioversionX2. - carotid angiogram. - DandC uterus. - endarcdectomy (L). - (L) heart cath. - lower ext angiogram X2. - peripheral athrectomy. Medical A Surgical History Notes nd Constitutional Symptoms (General Health) alcohol abuse , cocaine abuse , marijuana abuse , narcotic abuse , noncompliance , obesity , tobacco abuse , insomnia , Hematologic/Lymphatic B12 deficiency Respiratory wheezing , Cardiovascular cardiomyopathy , critical lower limb ischemia , elevated troponin , hyperlipidemia , hypokalemia , hypomagnesiumia , symptomatic bradycardia , carotid arterial disease ,demand ischemia  , Gastrointestinal GERD Endocrine hypothyroidism Genitourinary renal insufficiency , CKD Stage 3 , Integumentary (Skin) cellulitis Psychiatric anxiety Review of Systems (ROS) Constitutional Symptoms (General Health) Denies complaints or symptoms of Fatigue, Fever, Chills, Marked Weight Change. Eyes Denies complaints or symptoms of Dry Eyes, Vision Changes, Glasses / Contacts. Ear/Nose/Mouth/Throat Denies complaints or symptoms of Chronic sinus problems or rhinitis. Respiratory Denies complaints or symptoms of Chronic or frequent coughs, Shortness of Breath. Cardiovascular Denies complaints or symptoms of Chest pain. Gastrointestinal Denies complaints or symptoms of Frequent diarrhea, Nausea, Vomiting. Endocrine Denies complaints or symptoms of Heat/cold intolerance. Genitourinary Denies complaints or symptoms of Frequent urination. Integumentary (Skin) Complains or has symptoms of Wounds. Musculoskeletal Denies complaints or symptoms of Muscle Pain, Muscle Weakness. Neurologic Denies complaints or symptoms of Numbness/parasthesias. Psychiatric Denies complaints or symptoms of Claustrophobia, Suicidal. Objective Constitutional Patient is hypertensive.. Pulse regular and within target range for patient.[name] Respirations regular, non-labored and within target range.. Temperature is normal and within the target range for the patient.[name] Appears in no distress. Vitals Time Taken: 9:21 AM, Temperature: 98.5 F, Pulse: 67 bpm, Respiratory Rate: 17 breaths/min, Blood Pressure: 152/72 mmHg, Capillary Blood Glucose: 198 mg/dl. Respiratory work of breathing is normal. Reduced air entry prolonged expiratory phase and wheezing. Cardiovascular No murmurs JVP not elevated no evidence of CHF. Cannot feel popliteal or femoral pulses either. I cannot feel her pedal pulses on the right. Nonpitting edema in both lower legs. Psychiatric appears at normal baseline. General Notes: Wound exam;  the patient has 3 wounds. She has an area over her first met head plantar on the right. This is a dime sized wound with adherent fibrinous eschar, pending my review of her vascular status I did not debride this but we will try to place something that will help remove the surface ooShe has the laceration injury on the right anterior lower leg I removed her 5 sutures. She has an area of dehiscence in the wound medially here. None of this look too bad. Wash this off with Anasept and gauze. No mechanical debridement was necessary ooShe has what she says was a blister on the right medial lower leg obviously this is unroofed to reveal a superficial but fairly large wound. No evidence of infection Integumentary (Hair, Skin) What appears to be lymphedema in her bilateral lower legs. Wound #16 status is Open. Original cause of wound was Gradually Appeared. The date acquired was: 02/05/2021. The wound is located on the Right,Plantar Amputation Site - Transmetatarsal. The wound measures 1.3cm length x 1.6cm width x 0.3cm depth; 1.634cm^2 area and 0.49cm^3 volume. There is no tunneling or undermining noted. There is a medium amount of serosanguineous drainage noted. The wound margin is distinct with the outline attached to the wound base. There is no granulation within the wound bed. There is a large (67-100%) amount of necrotic tissue within the wound bed including Eschar and Adherent Slough. Wound #17 status is Open. Original cause of wound was Blister. The  date acquired was: 02/05/2021. The wound is located on the Right,Medial Lower Leg. The wound measures 6.4cm length x 5.5cm width x 0.1cm depth; 27.646cm^2 area and 2.765cm^3 volume. There is no tunneling or undermining noted. There is a medium amount of serosanguineous drainage noted. The wound margin is distinct with the outline attached to the wound base. There is large (67-100%) red, pink granulation within the wound bed. Wound #18 status is Open.  Original cause of wound was Trauma. The date acquired was: 02/05/2021. The wound is located on the Right,Anterior Lower Leg. The wound measures 5.5cm length x 6.4cm width x 0.1cm depth; 27.646cm^2 area and 2.765cm^3 volume. There is no tunneling or undermining noted. There is a large amount of serosanguineous drainage noted. The wound margin is distinct with the outline attached to the wound base. There is large (67-100%) red, pink granulation within the wound bed. There is a small (1-33%) amount of necrotic tissue within the wound bed including Eschar and Adherent Slough. Assessment Active Problems ICD-10 Type 2 diabetes mellitus with foot ulcer Non-pressure chronic ulcer of other part of right foot with other specified severity Type 2 diabetes mellitus with diabetic peripheral angiopathy without gangrene Non-pressure chronic ulcer of other part of right lower leg limited to breakdown of skin Plan Follow-up Appointments: Return Appointment in 1 week. Bathing/ Shower/ Hygiene: May shower with protection but do not get wound dressing(s) wet. Edema Control - Lymphedema / SCD / Other: Elevate legs to the level of the heart or above for 30 minutes daily and/or when sitting, a frequency of: Avoid standing for long periods of time. Exercise regularly Off-Loading: Open toe surgical shoe to: - with felt padding to offload Additional Orders / Instructions: Stop/Decrease Smoking Follow Nutritious Diet WOUND #16: - Amputation Site - Transmetatarsal Wound Laterality: Plantar, Right Cleanser: Soap and Water 1 x Per Week/15 Days Discharge Instructions: May shower and wash wound with dial antibacterial soap and water prior to dressing change. Prim Dressing: IODOFLEX 0.9% Cadexomer Iodine Pad 4x6 cm 1 x Per Week/15 Days ary Discharge Instructions: Apply to wound bed as instructed Secondary Dressing: Woven Gauze Sponge, Non-Sterile 4x4 in 1 x Per Week/15 Days Discharge Instructions: Apply over primary  dressing as directed. Com pression Wrap: Kerlix Roll 4.5x3.1 (in/yd) 1 x Per Week/15 Days Discharge Instructions: Apply Kerlix and Coban compression as directed. Com pression Wrap: Coban Self-Adherent Wrap 4x5 (in/yd) 1 x Per Week/15 Days Discharge Instructions: Apply over Kerlix as directed. WOUND #17: - Lower Leg Wound Laterality: Right, Medial Cleanser: Soap and Water 1 x Per Week/ Discharge Instructions: May shower and wash wound with dial antibacterial soap and water prior to dressing change. Prim Dressing: KerraCel Ag Gelling Fiber Dressing, 4x5 in (silver alginate) 1 x Per Week/ ary Discharge Instructions: Apply silver alginate to wound bed as instructed Secondary Dressing: Woven Gauze Sponge, Non-Sterile 4x4 in 1 x Per Week/ Discharge Instructions: Apply over primary dressing as directed. Secondary Dressing: ABD Pad, 8x10 1 x Per Week/ Discharge Instructions: Apply over primary dressing as directed. Com pression Wrap: Kerlix Roll 4.5x3.1 (in/yd) 1 x Per Week/ Discharge Instructions: Apply Kerlix and Coban compression as directed. Com pression Wrap: Coban Self-Adherent Wrap 4x5 (in/yd) 1 x Per Week/ Discharge Instructions: Apply over Kerlix as directed. WOUND #18: - Lower Leg Wound Laterality: Right, Anterior Cleanser: Soap and Water 1 x Per Week/ Discharge Instructions: May shower and wash wound with dial antibacterial soap and water prior to dressing change. Prim Dressing: KerraCel Ag Gelling Fiber Dressing, 4x5 in (  silver alginate) 1 x Per Week/ ary Discharge Instructions: Apply silver alginate to wound bed as instructed Secondary Dressing: Woven Gauze Sponge, Non-Sterile 4x4 in 1 x Per Week/ Discharge Instructions: Apply over primary dressing as directed. Secondary Dressing: ABD Pad, 8x10 1 x Per Week/ Discharge Instructions: Apply over primary dressing as directed. Com pression Wrap: Kerlix Roll 4.5x3.1 (in/yd) 1 x Per Week/ Discharge Instructions: Apply Kerlix and Coban  compression as directed. Com pression Wrap: Coban Self-Adherent Wrap 4x5 (in/yd) 1 x Per Week/ Discharge Instructions: Apply over Kerlix as directed. 1. 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 2. In spite of her claims that Dr. Gwenlyn Found had been into her anatomy several times on the right lower leg I had to go all the way back to 2018 for an angiogram to find anything from Dr. Gwenlyn Found. At that point she had placement of a stent I believe in the below-knee right popliteal artery. Reading the note that Dr. Gwenlyn Found provided suggested that she had had an atherectomy including a drug-eluting balloon angioplasty of the entire right SFA. At that point she had a wound on her right foot. 3. I also believe she has bilateral lymphedema and these are probably more of the cause for the wounds on the right anterior and right lateral lower leg then her arterial status but I do not think we are going to heal the wound on the bottom of her foot probably because of arterial status or unrelieved pressure 4. I elected to put silver alginate on the wounds on her anterior lower leg and the medial denuded blister. Iodoflex on the area on her right first metatarsal head I am going to put this in light compression with Kerlix and Coban. 5. Now that I see that her actual interventions on the right leg go back to 2018 in 2016 she is probably going to need to see Dr. Gwenlyn Found again. I will try to look and see what her kidney function is. 6. I counseled her to stop smoking but I do not know that that is going to be agreed to by the patient. I spent 45 minutes predominantly in review of this patient's past medical history and arterial status face-to-face evaluation and preparation of this record Electronic Signature(s) Signed: 02/19/2021 4:45:31 PM By: Linton Ham MD Entered By: Linton Ham on 02/19/2021  12:35:46 -------------------------------------------------------------------------------- HxROS Details Patient Name: Date of Service: [name], [name] 02/19/2021 9:00 A M Medical Record Number: [id number] Patient Account Number: [id number] Date of Birth/Sex: Treating RN: Jul 20, 1961 (60 y.o. Tonita Phoenix, Lauren Primary Care Provider: [name] Other Clinician: Referring Provider: Treating Provider/Extender: [name]: 0 Information Obtained From Patient Constitutional Symptoms (General Health) Complaints and Symptoms: Negative for: Fatigue; Fever; Chills; Marked Weight Change Medical History: Past Medical History Notes: alcohol abuse , cocaine abuse , marijuana abuse , narcotic abuse , noncompliance , obesity , tobacco abuse , insomnia , Eyes Complaints and Symptoms: Negative for: Dry Eyes; Vision Changes; Glasses / Contacts Medical History: Negative for: Cataracts; Glaucoma; Optic Neuritis Ear/Nose/Mouth/Throat Complaints and Symptoms: Negative for: Chronic sinus problems or rhinitis Medical History: Negative for: Chronic sinus problems/congestion; Middle ear problems Respiratory Complaints and Symptoms: Negative for: Chronic or frequent coughs; Shortness of Breath Medical History: Positive for: Chronic Obstructive Pulmonary Disease (COPD); Sleep Apnea Negative for: Aspiration; Asthma; Pneumothorax; Tuberculosis Past Medical History Notes: wheezing , Cardiovascular Complaints and Symptoms: Negative for: Chest pain Medical History: Positive for: Arrhythmia -  NSVT , PAF , PAT Congestive Heart Failure - chronic combined; Coronary Artery Disease; Hypertension; Peripheral Arterial ; Disease Negative for: Angina; Deep Vein Thrombosis; Hypotension; Myocardial Infarction; Peripheral Venous Disease; Phlebitis; Vasculitis Past Medical History Notes: cardiomyopathy , critical lower limb ischemia , elevated troponin , hyperlipidemia , hypokalemia ,  hypomagnesiumia , symptomatic bradycardia , carotid arterial disease ,demand ischemia , Gastrointestinal Complaints and Symptoms: Negative for: Frequent diarrhea; Nausea; Vomiting Medical History: Positive for: Cirrhosis - alcoholic Negative for: Colitis; Crohns; Hepatitis A; Hepatitis B; Hepatitis C Past Medical History Notes: GERD Endocrine Complaints and Symptoms: Negative for: Heat/cold intolerance Medical History: Positive for: Type II Diabetes Negative for: Type I Diabetes Past Medical History Notes: hypothyroidism Time with diabetes: 5 years Treated with: Insulin Blood sugar tested every day: Yes Tested : 3x a day Genitourinary Complaints and Symptoms: Negative for: Frequent urination Medical History: Negative for: End Stage Renal Disease Past Medical History Notes: renal insufficiency , CKD Stage 3 , Integumentary (Skin) Complaints and Symptoms: Positive for: Wounds Medical History: Negative for: History of Burn Past Medical History Notes: cellulitis Musculoskeletal Complaints and Symptoms: Negative for: Muscle Pain; Muscle Weakness Medical History: Positive for: Osteoarthritis Negative for: Gout; Rheumatoid Arthritis; Osteomyelitis Neurologic Complaints and Symptoms: Negative for: Numbness/parasthesias Medical History: Positive for: Neuropathy - dm2 peripheral Negative for: Dementia; Quadriplegia; Paraplegia; Seizure Disorder Psychiatric Complaints and Symptoms: Negative for: Claustrophobia; Suicidal Medical History: Negative for: Anorexia/bulimia; Confinement Anxiety Past Medical History Notes: anxiety Hematologic/Lymphatic Medical History: Negative for: Anemia; Hemophilia; Human Immunodeficiency Virus; Lymphedema; Sickle Cell Disease Past Medical History Notes: B12 deficiency Immunological Medical History: Negative for: Lupus Erythematosus; Raynauds; Scleroderma Oncologic Medical History: Negative for: Received Chemotherapy; Received  Radiation Immunizations Pneumococcal Vaccine: Received Pneumococcal Vaccination: Yes Tetanus Vaccine: Last tetanus shot: 10/14/1981 Implantable Devices None Hospitalization / Surgery History Type of Hospitalization/Surgery balloon angioplasty cardiac cath X2 cardioversionX2 carotid angiogram DandC uterus endarcdectomy (L) (L) heart cath lower ext angiogram X2 peripheral athrectomy Family and Social History Cancer: Yes - Mother; Diabetes: Yes - Mother; Heart Disease: Yes - Mother,Father; Hereditary Spherocytosis: No; Hypertension: Yes - Mother,Father; Kidney Disease: No; Lung Disease: Yes - Siblings; Seizures: No; Stroke: Yes - Mother; Thyroid Problems: Yes - Mother; Tuberculosis: No; Former smoker; Marital Status - Divorced; Alcohol Use: Never; Drug Use: Current History - marijuana; Caffeine Use: Daily - Coffee; Financial Concerns: No; Food, Clothing or Shelter Needs: No; Support System Lacking: No; Transportation Concerns: No Electronic Signature(s) Signed: 02/19/2021 4:45:31 PM By: Linton Ham MD Signed: 02/22/2021 5:44:40 PM By: [name] RN Entered By: [name] on 02/19/2021 09:18:45 -------------------------------------------------------------------------------- SuperBill Details Patient Name: Date of Service: [name], [name] 02/19/2021 Medical Record Number: [id number] Patient Account Number: [id number] Date of Birth/Sex: Treating RN: Oct 03, 1961 (60 y.o. [name] Primary Care Provider: [name] Other Clinician: Referring Provider: Treating Provider/Extender: [name]: 0 Diagnosis Coding ICD-10 Codes Code Description 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.518 Non-pressure chronic ulcer of other part of right foot with other specified severity E11.51 Type 2 diabetes mellitus with diabetic peripheral angiopathy without gangrene L97.811 Non-pressure chronic ulcer of other part of right lower leg  limited to breakdown of skin Facility Procedures CPT4 Code: [id number] Description: 56256 - WOUND CARE VISIT-LEV 5 EST PT Modifier: 25 Quantity: 1 Physician Procedures : CPT4 Code Description Modifier [id number] - WC PHYS LEVEL 5 - EST PT ICD-10 Diagnosis Description L97.518 Non-pressure chronic ulcer of other part of right foot with other specified severity L97.811 Non-pressure chronic  ulcer of other part of right  lower leg limited to breakdown of skin 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 2 diabetes mellitus with foot ulcer E11.51 Type 2 diabetes mellitus with diabetic peripheral angiopathy without gangrene Quantity: 1 Electronic Signature(s) Signed: 02/21/2021 4:24:03 PM By: Lorrin Jackson Signed: 02/22/2021 4:45:43 PM By: Linton Ham MD Previous Signature: 02/19/2021 4:45:31 PM Version By: Linton Ham MD Entered By: Lorrin Jackson on 02/21/2021 16:24:03

## 2021-02-23 ENCOUNTER — Other Ambulatory Visit: Payer: Self-pay

## 2021-02-23 ENCOUNTER — Emergency Department (HOSPITAL_COMMUNITY)
Admission: EM | Admit: 2021-02-23 | Discharge: 2021-02-23 | Disposition: A | Discharge status: Home / Self Care | Payer: Medicare Other | Attending: Emergency Medicine | Admitting: Emergency Medicine

## 2021-02-23 ENCOUNTER — Encounter (HOSPITAL_COMMUNITY): Payer: Self-pay

## 2021-02-23 DIAGNOSIS — I130 Hypertensive heart and chronic kidney disease with heart failure and stage 1 through stage 4 chronic kidney disease, or unspecified chronic kidney disease: Secondary | ICD-10-CM | POA: Insufficient documentation

## 2021-02-23 DIAGNOSIS — E039 Hypothyroidism, unspecified: Secondary | ICD-10-CM | POA: Diagnosis not present

## 2021-02-23 DIAGNOSIS — Z955 Presence of coronary angioplasty implant and graft: Secondary | ICD-10-CM | POA: Diagnosis not present

## 2021-02-23 DIAGNOSIS — E1140 Type 2 diabetes mellitus with diabetic neuropathy, unspecified: Secondary | ICD-10-CM | POA: Diagnosis not present

## 2021-02-23 DIAGNOSIS — N183 Chronic kidney disease, stage 3 unspecified: Secondary | ICD-10-CM | POA: Insufficient documentation

## 2021-02-23 DIAGNOSIS — S199XXA Unspecified injury of neck, initial encounter: Secondary | ICD-10-CM | POA: Diagnosis present

## 2021-02-23 DIAGNOSIS — I2510 Atherosclerotic heart disease of native coronary artery without angina pectoris: Secondary | ICD-10-CM | POA: Insufficient documentation

## 2021-02-23 DIAGNOSIS — F17210 Nicotine dependence, cigarettes, uncomplicated: Secondary | ICD-10-CM | POA: Insufficient documentation

## 2021-02-23 DIAGNOSIS — S161XXA Strain of muscle, fascia and tendon at neck level, initial encounter: Secondary | ICD-10-CM | POA: Diagnosis not present

## 2021-02-23 DIAGNOSIS — X58XXXA Exposure to other specified factors, initial encounter: Secondary | ICD-10-CM | POA: Insufficient documentation

## 2021-02-23 DIAGNOSIS — I5042 Chronic combined systolic (congestive) and diastolic (congestive) heart failure: Secondary | ICD-10-CM | POA: Insufficient documentation

## 2021-02-23 DIAGNOSIS — J449 Chronic obstructive pulmonary disease, unspecified: Secondary | ICD-10-CM | POA: Diagnosis not present

## 2021-02-23 LAB — BASIC METABOLIC PANEL
Anion gap: 7 (ref 5–15)
BUN: 33 mg/dL — ABNORMAL HIGH (ref 6–20)
CO2: 30 mmol/L (ref 22–32)
Calcium: 8.4 mg/dL — ABNORMAL LOW (ref 8.9–10.3)
Chloride: 103 mmol/L (ref 98–111)
Creatinine, Ser: 1.55 mg/dL — ABNORMAL HIGH (ref 0.44–1.00)
GFR, Estimated: 39 mL/min — ABNORMAL LOW (ref >60)
Glucose, Bld: 74 mg/dL (ref 70–99)
Potassium: 3.7 mmol/L (ref 3.5–5.1)
Sodium: 140 mmol/L (ref 135–145)

## 2021-02-23 LAB — URINALYSIS, ROUTINE W REFLEX MICROSCOPIC
Bilirubin Urine: NEGATIVE
Glucose, UA: NEGATIVE mg/dL
Hgb urine dipstick: NEGATIVE
Ketones, ur: NEGATIVE mg/dL
Leukocytes,Ua: NEGATIVE
Nitrite: NEGATIVE
Protein, ur: NEGATIVE mg/dL
Specific Gravity, Urine: 1.008 (ref 1.005–1.030)
pH: 5 (ref 5.0–8.0)

## 2021-02-23 LAB — CBC
HCT: 30.3 % — ABNORMAL LOW (ref 36.0–46.0)
Hemoglobin: 9.6 g/dL — ABNORMAL LOW (ref 12.0–15.0)
MCH: 30.6 pg (ref 26.0–34.0)
MCHC: 31.7 g/dL (ref 30.0–36.0)
MCV: 96.5 fL (ref 80.0–100.0)
Platelets: 332 10*3/uL (ref 150–400)
RBC: 3.14 MIL/uL — ABNORMAL LOW (ref 3.87–5.11)
RDW: 14.9 % (ref 11.5–15.5)
WBC: 14.2 10*3/uL — ABNORMAL HIGH (ref 4.0–10.5)
nRBC: 0 % (ref 0.0–0.2)

## 2021-02-23 LAB — I-STAT BETA HCG BLOOD, ED (MC, WL, AP ONLY): I-stat hCG, quantitative: <5 m[IU]/mL (ref <5)

## 2021-02-23 LAB — CBG MONITORING, ED: Glucose-Capillary: 99 mg/dL (ref 70–99)

## 2021-02-23 MED ORDER — METHOCARBAMOL 500 MG PO TABS: 500.0000 mg | ORAL_TABLET | Freq: Three times a day (TID) | ORAL | 0 refills | Status: DC | PRN

## 2021-02-23 MED ORDER — DIAZEPAM 5 MG PO TABS
5.0000 mg | ORAL_TABLET | Freq: Once | ORAL | Status: AC
  Administered 2021-02-23: 5 mg via ORAL
  Filled 2021-02-23: qty 1

## 2021-02-23 NOTE — Discharge Instructions (Addendum)
You were evaluated in the Emergency Department and after careful evaluation, we did not find any emergent condition requiring admission or further testing in the hospital.  Your exam/testing today was overall reassuring.  Symptoms seem to be due to a neck strain or spasm.  Recommend Tylenol and Motrin at home for discomfort.  You can also use the Robaxin muscle relaxers for pain.  Please return to the Emergency Department if you experience any worsening of your condition.  Thank you for allowing [place] to be a part of your care.

## 2021-02-23 NOTE — ED Triage Notes (Signed)
Pt c/o pain in neck x 3 days. States it feels tight and she turned her head today and pain worsened. Pt c/o numbness and tingling in hands which is not new for her. Pt c/o headache and dizziness as well.

## 2021-02-23 NOTE — ED Provider Notes (Signed)
[facility] Emergency Department Provider Note MRN:  [id number]  Arrival date & time: 02/23/21     Chief Complaint   Neck Pain   History of Present Illness   [name] is a 60 y.o. year-old female with a history of CAD, CKD, PAD presenting to the ED with chief complaint of neck pain.  Noticed some mild neck pain upon awakening 3 days ago.  Turned her neck to the side today and experienced acute worsening of this pain.  Pain located in the right lateral neck.  Worse with motion or palpation.  She endorses some chronic paresthesias to the lower extremities but no changes to her strength or sensation to her arms or legs.  No bowel or bladder dysfunction.  No headache or vision change, no chest pain or shortness of breath, no abdominal pain.  Pain is constant.  Review of Systems  A complete 10 system review of systems was obtained and all systems are negative except as noted in the HPI and PMH.   Patient's Health History    Past Medical History:  Diagnosis Date  . Alcohol abuse   . Alcoholic cirrhosis ([place])   . Anemia   . Anxiety   . Arthritis    "knees" (11/26/2018)  . B12 deficiency   . CAD (coronary artery disease)    a. 11/10/2014 Cath: LM nl, LAD min irregs, D1 30 ost, D2 50d, LCX 40m OM1 80 p/m (1.5 mm vessel), OM2 415mRCA nondom 9012mmed rx.. Demand ischemia in the setting of rapid a-fib.  . Cardiomyopathy (HCC[place] . Carotid artery disease ([place]CC[place]  a. 01/2015 Carotid Angio: RICA [id number]. [id number] L CEA; c. 05/2019 Carotid U/S: RICA 100. RECA >50. LICA [id number]. Cellulitis    lower extremities  . Chronic combined systolic and diastolic CHF (congestive heart failure) (HCC[place]  a. 10/2014 Echo: EF 40-45%; b. 10/2018 Echo: EF 45-50%, gr2 DD; c. 11/2019 Echo: EF 50%, mild LVH, gr2 DD (restrictive), antlat HK, Nl RV fxn. Mild BAE. RVSP 59.9mm71m  . CKD (chronic kidney disease), stage III ([name])[place]. Cocaine abuse ([name])[place]. COPD (chronic  obstructive pulmonary disease) ([name])[place]. Critical lower limb ischemia ([name])[place]. Depression   . Diabetic peripheral neuropathy ([name])[place]. Dyspnea   . Elevated troponin    a. Chronic elevation.  . GEMarland KitchenD (gastroesophageal reflux disease)   . Hyperlipemia   . Hypertension   . Hypokalemia   . Hypomagnesemia   . Hypothyroidism   . Marijuana abuse   . Narcotic abuse ([name])[place]. Noncompliance   . NSVT (nonsustained ventricular tachycardia) ([name])[place]. Obesity   . PAF (paroxysmal atrial fibrillation) ([name])[place]. Paroxysmal atrial tachycardia ([name])[place]. Peripheral arterial disease ([name])[place] a. 01/2015 Angio/PTA: RSFA 99 (atherectomy/pta) - 1 vessel runoff via diff dzs peroneal; b. 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  . Pneumonia    "once or twice" (11/26/2018)  . Poorly controlled type 2 diabetes mellitus ([name])[place]. Renal insufficiency    a. 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  . SlMarland Kitchenep apnea    "couldn't handle wearing the mask" (11/26/2018)  . Symptomatic bradycardia    a. Avoid AV blocking agent per EP. Prev req temp wire in 2017.  . Tobacco abuse     Past Surgical History:  Procedure Laterality Date  . ABDOMINAL AORTOGRAM N/A 06/26/2019   Procedure: ABDOMINAL AORTOGRAM;  Surgeon: Wellington Hampshire, MD;  Location: [place] [facility];  Service: Cardiovascular;  Laterality: N/A;  . AMPUTATION Right 06/14/2017   Procedure: Right foot transmetatarsal amputation;  Surgeon: Newt Minion, MD;  Location: [place];  Service: Orthopedics;  Laterality: Right;  . AMPUTATION Left 06/28/2019   Procedure: AMPUTATION LEFT FIFTH TOE;  Surgeon: Rosetta Posner, MD;  Location: [place];  Service: Vascular;  Laterality: Left;  . AMPUTATION TOE Right 04/28/2017   Procedure: AMPUTATION OF RIGHT SECOND RAY;  Surgeon: Newt Minion, MD;  Location: [place];  Service: Orthopedics;  Laterality: Right;  . CARDIAC CATHETERIZATION    . CARDIAC CATHETERIZATION N/A 01/12/2016   Procedure: Temporary Wire;  Surgeon: Minus Breeding, MD;  Location: [place] [facility];  Service: Cardiovascular;  Laterality: N/A;  . CARDIOVERSION  ~ 02/2013   "twice"   . CAROTID ANGIOGRAM N/A 01/15/2015   Procedure: CAROTID ANGIOGRAM;  Surgeon: [name], MD;  Location: [facility] CATH LAB;  Service: Cardiovascular;  Laterality: N/A;  . DILATION AND CURETTAGE OF UTERUS  1988  . ENDARTERECTOMY Left 02/19/2015   Procedure: LEFT CAROTID ENDARTERECTOMY ;  Surgeon: Serafina Mitchell, MD;  Location: [place];  Service: Vascular;  Laterality: Left;  . FEMORAL-TIBIAL BYPASS GRAFT Left 06/28/2019   Procedure: BYPASS GRAFT LEFT LEG FEMORAL TO ANTERIOR TIBIAL ARTERY using LEFT GREATER SAPHENOUS VEIN;  Surgeon: Rosetta Posner, MD;  Location: [place];  Service: Vascular;  Laterality: Left;  . LEFT HEART CATHETERIZATION WITH CORONARY ANGIOGRAM N/A 10/31/2014   Procedure: LEFT HEART CATHETERIZATION WITH CORONARY ANGIOGRAM;  Surgeon: Burnell Blanks, MD;  Location: [facility] CATH LAB;  Service: Cardiovascular;  Laterality: N/A;  . LOWER EXTREMITY ANGIOGRAM N/A 09/10/2013   Procedure: LOWER EXTREMITY ANGIOGRAM;  Surgeon: [name], MD;  Location: [facility] CATH LAB;  Service: Cardiovascular;  Laterality: N/A;  . LOWER EXTREMITY ANGIOGRAM N/A 01/15/2015   Procedure: LOWER EXTREMITY ANGIOGRAM;  Surgeon: [name], MD;  Location: [facility] CATH LAB;  Service: Cardiovascular;  Laterality: N/A;  . LOWER EXTREMITY ANGIOGRAPHY N/A 04/13/2017   Procedure: Lower Extremity Angiography;  Surgeon: [name], MD;  Location: [place] [facility];  Service: Cardiovascular;  Laterality: N/A;  . LOWER EXTREMITY ANGIOGRAPHY Left 06/26/2019   Procedure: LOWER EXTREMITY ANGIOGRAPHY;  Surgeon: Wellington Hampshire, MD;  Location: [place] [facility];  Service: Cardiovascular;  Laterality: Left;  . PERIPHERAL VASCULAR BALLOON ANGIOPLASTY Left 06/26/2019   Procedure: PERIPHERAL VASCULAR BALLOON ANGIOPLASTY;  Surgeon: Wellington Hampshire, MD;  Location: [place] [facility];  Service:  Cardiovascular;  Laterality: Left;  unable to cross lt sfa occlusion  . PERIPHERAL VASCULAR INTERVENTION  04/13/2017   Procedure: Peripheral Vascular Intervention;  Surgeon: [name], MD;  Location: [place] [facility];  Service: Cardiovascular;;  . PRESSURE SENSOR/CARDIOMEMS N/A 02/05/2020   Procedure: PRESSURE SENSOR/CARDIOMEMS;  Surgeon: Larey Dresser, MD;  Location: [place] [facility];  Service: Cardiovascular;  Laterality: N/A;  . VEIN HARVEST Left 06/28/2019   Procedure: LEFT LEG GREATER SAPHENOUS VEIN HARVEST;  Surgeon: Rosetta Posner, MD;  Location: [facility];  Service: Vascular;  Laterality: Left;    Family History  Problem Relation Age of Onset  . Hypertension Mother   . Diabetes Mother   . Cancer Mother        breast, ovarian, colon  . Clotting disorder Mother   . Heart disease  Mother   . Heart attack Mother   . Breast cancer Mother        in 48's  . Hypertension Father   . Heart disease Father   . Emphysema Sister        smoked    Social History   Socioeconomic History  . Marital status: Divorced    Spouse name: Not on file  . Number of children: 1  . Years of education: 64  . Highest education level: 12th grade  Occupational History  . Occupation: disabled  Tobacco Use  . Smoking status: Current Some Day Smoker    Packs/day: 1.00    Years: 44.00    Pack years: 44.00    Types: E-cigarettes, Cigarettes  . Smokeless tobacco: Former Systems developer    Types: Snuff  Vaping Use  . Vaping Use: Former  . Devices: 11/26/2018 "stopped months ago"  Substance and Sexual Activity  . Alcohol use: Yes    Comment: occ  . Drug use: Yes    Types: "Crack" cocaine, Marijuana, Oxycodone  . Sexual activity: Not Currently  Other Topics Concern  . Not on file  Social History Narrative   Lives in [place] in [place] with sister.  They are looking to move but don't have a place to go yet.     Social Determinants of Health   Financial Resource Strain: Low Risk   . Difficulty of Paying  Living Expenses: Not very hard  Food Insecurity: Not on file  Transportation Needs: Not on file  Physical Activity: Not on file  Stress: Not on file  Social Connections: Not on file  Intimate Partner Violence: Not on file     Physical Exam   Vitals:   02/23/21 0400 02/23/21 0415  BP: (!) 129/48   Pulse: 66 76  Resp: (!) 23 14  Temp:    SpO2: 93% 97%    CONSTITUTIONAL: Chronically ill-appearing, NAD NEURO:  Alert and oriented x 3, normal and symmetric strength and sensation, normal coordination, normal speech EYES:  eyes equal and reactive ENT/NECK:  no LAD, no JVD; tender to palpation to the right lateral neck CARDIO: Regular rate, well-perfused, normal S1 and S2 PULM:  CTAB no wheezing or rhonchi GI/GU:  normal bowel sounds, non-distended, non-tender MSK/SPINE:  No gross deformities, no edema SKIN:  no rash, atraumatic PSYCH:  Appropriate speech and behavior  *Additional and/or pertinent findings included in MDM below  Diagnostic and Interventional Summary    EKG Interpretation  Date/Time:  Tuesday February 23 2021 01:21:55 EDT Ventricular Rate:  68 PR Interval:  164 QRS Duration: 76 QT Interval:  444 QTC Calculation: 472 R Axis:   35 Text Interpretation: Normal sinus rhythm Normal ECG Confirmed by Gerlene Fee [phone]) on 02/23/2021 4:15:57 AM      Labs Reviewed  BASIC METABOLIC PANEL - Abnormal; Notable for the following components:      Result Value   BUN 33 (*)    Creatinine, Ser 1.55 (*)    Calcium 8.4 (*)    GFR, Estimated 39 (*)    All other components within normal limits  CBC - Abnormal; Notable for the following components:   WBC 14.2 (*)    RBC 3.14 (*)    Hemoglobin 9.6 (*)    HCT 30.3 (*)    All other components within normal limits  URINALYSIS, ROUTINE W REFLEX MICROSCOPIC - Abnormal; Notable for the following components:   Color, Urine STRAW (*)    All other components within normal  limits  CBG MONITORING, ED  I-STAT BETA HCG BLOOD, ED ([facility],  [facility], [facility] ONLY)    No orders to display    Medications  diazepam (VALIUM) tablet 5 mg (5 mg Oral Given 02/23/21 0329)     Procedures  /  Critical Care Procedures  ED Course and Medical Decision Making  I have reviewed the triage vital signs, the nursing notes, and pertinent available records from the EMR.  Listed above are laboratory and imaging tests that I personally ordered, reviewed, and interpreted and then considered in my medical decision making (see below for details).  No trauma, no neurological deficits, nothing to suggest myelopathy or carotid dissection or other emergent condition.  Seems consistent with muscle strain or spasm.  Providing Valium and will reassess.     Pain is improved.  EKG is normal, doubt cardiac etiology.  Appropriate for discharge per  Barth Kirks. [place]onia Small, [place] [email or web address]  Final Clinical Impressions(s) / ED Diagnoses     ICD-10-CM   1. Acute strain of neck muscle, initial encounter  S16.1XXA     ED Discharge Orders         Ordered    methocarbamol (ROBAXIN) 500 MG tablet  Every 8 hours PRN,   Status:  Discontinued        02/23/21 0416    methocarbamol (ROBAXIN) 500 MG tablet  Every 8 hours PRN        02/23/21 0416           Discharge Instructions Discussed with and Provided to Patient:     Discharge Instructions     You were evaluated in the Emergency Department and after careful evaluation, we did not find any emergent condition requiring admission or further testing in the hospital.  Your exam/testing today was overall reassuring.  Symptoms seem to be due to a neck strain or spasm.  Recommend Tylenol and Motrin at home for discomfort.  You can also use the Robaxin muscle relaxers for pain.  Please return to the Emergency Department if you experience any worsening of your condition.  Thank you for allowing [place] to be a part of your care.        Maudie Flakes,  MD 02/23/21 [phone]

## 2021-02-26 ENCOUNTER — Other Ambulatory Visit: Payer: Self-pay

## 2021-02-26 ENCOUNTER — Encounter (HOSPITAL_BASED_OUTPATIENT_CLINIC_OR_DEPARTMENT_OTHER): Payer: Medicare Other | Admitting: Internal Medicine

## 2021-02-26 DIAGNOSIS — E11621 Type 2 diabetes mellitus with foot ulcer: Secondary | ICD-10-CM | POA: Diagnosis not present

## 2021-02-26 NOTE — Progress Notes (Signed)
[name], [name] ([id number]) Visit Report for 02/26/2021 Debridement Details Patient Name: Date of Service: [name] 02/26/2021 3:45 PM Medical Record Number: [id number] Patient Account Number: [id number] Date of Birth/Sex: Treating RN: 05-31-1961 (60 y.o. [name] Primary Care Provider: SYSTEM, PRO V IDER Other Clinician: Referring Provider: Treating Provider/Extender: [name]: 1 Debridement Performed for Assessment: Wound #16 Right,Plantar Amputation Site - Transmetatarsal Performed By: Physician Ricard Dillon., MD Debridement Type: Debridement Severity of Tissue Pre Debridement: Fat layer exposed Level of Consciousness (Pre-procedure): Awake and Alert Pre-procedure Verification/Time Out Yes - 16:23 Taken: Start Time: 16:24 Pain Control: Lidocaine 4% T opical Solution T Area Debrided (L x W): otal 1.3 (cm) x 1.4 (cm) = 1.82 (cm) Tissue and other material debrided: Non-Viable, Slough, Subcutaneous, Slough Level: Skin/Subcutaneous Tissue Debridement Description: Excisional Instrument: Curette Bleeding: Minimum Hemostasis Achieved: Pressure End Time: 16:27 Response to Treatment: Procedure was tolerated well Level of Consciousness (Post- Awake and Alert procedure): Post Debridement Measurements of Total Wound Length: (cm) 1.3 Width: (cm) 1.4 Depth: (cm) 0.2 Volume: (cm) 0.286 Character of Wound/Ulcer Post Debridement: Stable Severity of Tissue Post Debridement: Fat layer exposed Post Procedure Diagnosis Same as Pre-procedure Electronic Signature(s) Signed: 02/26/2021 4:57:27 PM By: Linton Ham MD Signed: 02/26/2021 5:21:53 PM By: Lorrin Jackson Entered By: Lorrin Jackson on 02/26/2021 16:30:01 -------------------------------------------------------------------------------- HPI Details Patient Name: Date of Service: Karla Corwin, [name] 02/26/2021 3:45 PM Medical Record Number: [id number] Patient Account Number:  [id number] Date of Birth/Sex: Treating RN: 07-19-1961 (60 y.o. [name] Primary Care Provider: SYSTEM, PRO Ave Filter Other Clinician: Referring Provider: Treating Provider/Extender: [name]: 1 History of Present Illness HPI Description: This 60 year old patient who has a very long significant history of diabetes mellitus, previous alcohol and nicotine abuse, chronic data disease, COPD, diabetes mellitus, height hypertension, critical lower limb ischemia with several wounds being managed at the wound Center at [facility] for over a year. She was recently in the ER at [facility] and was referred to our center. He has had a long history of critical limb ischemia and over a period of time has had balloon angioplasties in March 2016, endarterectomy of the left carotid, lower extremity angiogram and treatment by Dr. Quay Burow, several cardiac catheterizations. Most recently she had an x-ray of her left foot while in the ER which showed no acute abnormality. during this ER visit she was started on ciprofloxacin and asked to continue with the wound care physicians at [facility] Her last ABI done in June 2017 showed the right side to be 0.28 in the left side to be 0.48. Her right toe brachial index was 0.17 on the right and 0.27 on the left. her last hemoglobin A1c was 11.1% She continues to smoke about a pack of cigarettes a day. 03/28/2017 -- -- right foot x-ray -- IMPRESSION:Areas of soft tissue swelling. Mild subluxation second PIP joint. No frank dislocation or fracture. No erosive change or bony destruction. No soft tissue ulceration or radiopaque foreign body evident by radiography. There is plantar fascia calcification with a nearby inferior calcaneal spur. There are foci of arterial vascular calcification. 04/04/2017 -- the patient continues to be noncompliant and continues to complain of a lot of pain and has not done anything about quitting  smoking Readmission: 06/26/18 on evaluation today patient presents for reevaluation she has not been seen in our office since May 2018. Since she was last seen here in our office she  has undergone testing at Dr. Elspeth Cho office where it was revealed that she had an ABI of 0.68 with her previous ABI being 0.64 and a left ABI of 0.38 with previous ABI being 0.34 this study which was performed on 04/05/18 was pretty much equivalent to the study performed on 11/22/17. The findings in the end revealed on the right would appear to be moderate right lower extremity arterial disease on the left Dr. Gwenlyn Found states moderate in the report but unfortunately this appears to be much more severe at 0.38 compared to the right. Subsequently the patient was scheduled to have angiography with Dr. Gwenlyn Found on 04/12/18. This was however canceled due to the fact that the patient was found to have chronic kidney disease stage III and it was to the point that he did not feel that it will be safe to pursue angiography at that point. She has not been on any antibiotics recently. At this point Dr. Gwenlyn Found has not rescheduled anything as far as the injured [place] according to the patient he is somewhat reluctant to do so. Nonetheless with her diminished blood flow this is gonna make it somewhat difficult for her to heal. 07/05/18; this is a patient I have not seen previously. She has very significant PAD as noted above. She apparently has had revascularization efforts by Dr. Gwenlyn Found on the right on 3 occasions to the patient. She was supposed to have an attempted angiography on the left however this was canceled apparently because of stage III chronic renal failure. I will need to research all of this. She complains of significant pain in the wound and has claudication enough that when she walks to the end of the driveway she has to stop. She is been using silver alginate to the wounds on her legs and Iodoflex to the area on her  left second toe. 07/13/18 on evaluation today patient's wounds actually appear to be doing about the same. She has an appointment she tells me within the next month that is September 2019 with Dr. Gwenlyn Found to discuss options to see if there's anything else that you can recommend or do for her. Nonetheless obviously what we're trying to do is do what we can to save her leg and in turn prevent any additional worsening or damage. None in the meantime we been mainly trying to manage her ulcers as best we can. 07/27/18; some improvement in the multiplicity of wounds on her left lower extremity and foot. She's been using silver alginate. I was unable to determine that she actually has an appointment with Dr. Gwenlyn Found. We are checking into this The patient's wound includes Left lateral foot, left plantar heel, her left anterior calf wound looks close to me, left fourth toe is very close to closed and the left medial calf is perhaps the largest open area READMISSION 02/19/2021 This is a now 60 year old woman with type 2 diabetes and continued cigarette smoking. She has known PAD. She was last in this clinic in 2019 at that point with wounds on her left foot. She left in a nonhealed state. On 06/28/2019 I see she had a left femoropopliteal by vein and vascular with an amputation of the fifth ray. These managed to heal over. Her last arterial studies were in February 2021. This showed an absent waveform at the posterior tibial artery on the right a dampened monophasic dorsalis pedis on the right of 0.44. On the left again the PTA TA was absent her dorsalis pedis was 0.99 triphasic  and her great toe pressure was 0.65. This would have been after her revascularization. Once again she tells me that Dr. Gwenlyn Found has done revascularization on the right leg on 3 different occasions. She has a right transmetatarsal amputation apparently done by Dr. Sharol Given remotely but I do not see a note for these right lower  extremity revascularization but I may not of looked back far enough. In any case she says that the she has a wound on the plantar aspect of the right transmetatarsal amputation site there is been there for several months. More recently she fell and had a wound on her right medial lower leg she had 5 sutures placed in the ER and then subsequently she has developed an area on the medial lower leg which was a blister that opened up. Past medical history includes type 2 diabetes, PAD, chronic renal failure, congestive heart failure, right transmetatarsal amputation, left fifth ray amputation, continued tobacco abuse, atrial fibrillation and cirrhosis. We did not attempt an ABI on the right leg today because of pain 02/26/2021; patient we admitted to the clinic last week. She has what looks to be 2 areas on her right medial and right anterior lower leg that look more like venous wounds but she has 1 on the first met head at the base of her right transmet. She has known severe PAD. She complains of a lot of pain although some of this may be neuropathic. Is difficult to exclude a component of claudication. Use silver alginate on the wounds on her legs and Iodoflex on the area on the first met head. She has an appointment with Dr. Gwenlyn Found on 5/25 although I will text him and discussed the situation. She is have poorly controlled diabetic. She has has stage IIIb chronic renal failure Electronic Signature(s) Signed: 02/26/2021 4:57:27 PM By: Linton Ham MD Entered By: Linton Ham on 02/26/2021 16:32:21 -------------------------------------------------------------------------------- Physical Exam Details Patient Name: Date of Service: Karla Corwin, [name] 02/26/2021 3:45 PM Medical Record Number: [id number] Patient Account Number: [id number] Date of Birth/Sex: Treating RN: 04-Sep-1961 (60 y.o. [name] Primary Care Provider: SYSTEM, PRO Ave Filter Other Clinician: Referring Provider: Treating  Provider/Extender: [name]: 1 Constitutional Patient is hypertensive.. Pulse regular and within target range for patient.[name] Respirations regular, non-labored and within target range.. Temperature is normal and within the target range for the patient.[name] Appears in no distress. Notes Wound exam; She has an area over her first met head plantar on the right. Dime sized wound with very fibrinous debris. I debrided this with a #3 curette she had a surprising amount of bleeding. She had a laceration wound on the right anterior lower leg I am remove the sutures last week. This looks like it is contracted. Finally on the right medial lower leg a large blister. This is beginning to epithelialize. Electronic Signature(s) Signed: 02/26/2021 4:57:27 PM By: Linton Ham MD Entered By: Linton Ham on 02/26/2021 16:33:37 -------------------------------------------------------------------------------- Physician Orders Details Patient Name: Date of Service: Karla Corwin, [name] 02/26/2021 3:45 PM Medical Record Number: [id number] Patient Account Number: [id number] Date of Birth/Sex: Treating RN: [birth year] (60 y.o. [name] Primary Care Provider: SYSTEM, PRO Ave Filter Other Clinician: Referring Provider: Treating Provider/Extender: [name]: 1 Verbal / Phone Orders: No Diagnosis Coding ICD-10 Coding Code Description 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.518 Non-pressure chronic ulcer of other part of right foot with other specified severity E11.51 Type 2 diabetes mellitus with  diabetic peripheral angiopathy without gangrene L97.811 Non-pressure chronic ulcer of other part of right lower leg limited to breakdown of skin Follow-up Appointments Return Appointment in 1 week. Bathing/ Shower/ Hygiene May shower with protection but do not get wound dressing(s) wet. Edema Control - Lymphedema / SCD / Other Elevate legs to the level  of the heart or above for 30 minutes daily and/or when sitting, a frequency of: Avoid standing for long periods of time. Exercise regularly Off-Loading Open toe surgical shoe to: - with felt padding to offload Additional Orders / Instructions Stop/Decrease Smoking Follow Nutritious Diet Wound Treatment Wound #16 - Amputation Site - Transmetatarsal Wound Laterality: Plantar, Right Cleanser: Soap and Water 1 x Per Week/15 Days Discharge Instructions: May shower and wash wound with dial antibacterial soap and water prior to dressing change. Prim Dressing: IODOFLEX 0.9% Cadexomer Iodine Pad 4x6 cm 1 x Per Week/15 Days ary Discharge Instructions: Apply to wound bed as instructed Secondary Dressing: Woven Gauze Sponge, Non-Sterile 4x4 in 1 x Per Week/15 Days Discharge Instructions: Apply over primary dressing as directed. Compression Wrap: Kerlix Roll 4.5x3.1 (in/yd) 1 x Per Week/15 Days Discharge Instructions: Apply Kerlix and Coban compression as directed. Compression Wrap: Coban Self-Adherent Wrap 4x5 (in/yd) 1 x Per Week/15 Days Discharge Instructions: Apply over Kerlix as directed. Wound #17 - Lower Leg Wound Laterality: Right, Medial Cleanser: Soap and Water 1 x Per Week Discharge Instructions: May shower and wash wound with dial antibacterial soap and water prior to dressing change. Prim Dressing: KerraCel Ag Gelling Fiber Dressing, 4x5 in (silver alginate) 1 x Per Week ary Discharge Instructions: Apply silver alginate to wound bed as instructed Secondary Dressing: Woven Gauze Sponge, Non-Sterile 4x4 in 1 x Per Week Discharge Instructions: Apply over primary dressing as directed. Secondary Dressing: ABD Pad, 8x10 1 x Per Week Discharge Instructions: Apply over primary dressing as directed. Compression Wrap: Kerlix Roll 4.5x3.1 (in/yd) 1 x Per Week Discharge Instructions: Apply Kerlix and Coban compression as directed. Compression Wrap: Coban Self-Adherent Wrap 4x5 (in/yd) 1 x Per  Week Discharge Instructions: Apply over Kerlix as directed. Wound #18 - Lower Leg Wound Laterality: Right, Anterior Cleanser: Soap and Water 1 x Per Week Discharge Instructions: May shower and wash wound with dial antibacterial soap and water prior to dressing change. Prim Dressing: KerraCel Ag Gelling Fiber Dressing, 4x5 in (silver alginate) 1 x Per Week ary Discharge Instructions: Apply silver alginate to wound bed as instructed Secondary Dressing: Woven Gauze Sponge, Non-Sterile 4x4 in 1 x Per Week Discharge Instructions: Apply over primary dressing as directed. Secondary Dressing: ABD Pad, 8x10 1 x Per Week Discharge Instructions: Apply over primary dressing as directed. Compression Wrap: Kerlix Roll 4.5x3.1 (in/yd) 1 x Per Week Discharge Instructions: Apply Kerlix and Coban compression as directed. Compression Wrap: Coban Self-Adherent Wrap 4x5 (in/yd) 1 x Per Week Discharge Instructions: Apply over Kerlix as directed. Electronic Signature(s) Signed: 02/26/2021 4:57:27 PM By: Linton Ham MD Signed: 02/26/2021 5:21:53 PM By: Lorrin Jackson Previous Signature: 02/26/2021 3:51:44 PM Version By: Lorrin Jackson Entered By: Lorrin Jackson on 02/26/2021 16:30:39 -------------------------------------------------------------------------------- Problem List Details Patient Name: Date of Service: Karla Corwin, [name] 02/26/2021 3:45 PM Medical Record Number: [id number] Patient Account Number: [id number] Date of Birth/Sex: Treating RN: [birth year] (60 y.o. [name] Primary Care Provider: SYSTEM, PRO Ave Filter Other Clinician: Referring Provider: Treating Provider/Extender: [name]: 1 Active Problems ICD-10 Encounter Encounter Code Description Active Date MDM Diagnosis 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 2 diabetes mellitus with foot ulcer 02/19/2021  No Yes L97.518 Non-pressure chronic ulcer of other part of right foot with other specified 02/19/2021 No Yes severity E11.51 Type 2  diabetes mellitus with diabetic peripheral angiopathy without gangrene 02/19/2021 No Yes L97.811 Non-pressure chronic ulcer of other part of right lower leg limited to breakdown 02/19/2021 No Yes of skin Inactive Problems Resolved Problems Electronic Signature(s) Signed: 02/26/2021 4:57:27 PM By: Linton Ham MD Previous Signature: 02/26/2021 4:19:51 PM Version By: Lorrin Jackson Previous Signature: 02/26/2021 3:51:18 PM Version By: Lorrin Jackson Entered By: Linton Ham on 02/26/2021 16:30:02 -------------------------------------------------------------------------------- Progress Note Details Patient Name: Date of Service: Karla Corwin, [name] 02/26/2021 3:45 PM Medical Record Number: [id number] Patient Account Number: [id number] Date of Birth/Sex: Treating RN: Aug 01, 1961 (60 y.o. [name] Primary Care Provider: SYSTEM, PRO V IDER Other Clinician: Referring Provider: Treating Provider/Extender: [name]: 1 Subjective History of Present Illness (HPI) This 60 year old patient who has a very long significant history of diabetes mellitus, previous alcohol and nicotine abuse, chronic data disease, COPD, diabetes mellitus, height hypertension, critical lower limb ischemia with several wounds being managed at the wound Center at [facility] for over a year. She was recently in the ER at [facility] and was referred to our center. He has had a long history of critical limb ischemia and over a period of time has had balloon angioplasties in March 2016, endarterectomy of the left carotid, lower extremity angiogram and treatment by Dr. Quay Burow, several cardiac catheterizations. Most recently she had an x-ray of her left foot while in the ER which showed no acute abnormality. during this ER visit she was started on ciprofloxacin and asked to continue with the wound care physicians at [facility] Her last ABI done in June 2017 showed the right side to  be 0.28 in the left side to be 0.48. Her right toe brachial index was 0.17 on the right and 0.27 on the left. her last hemoglobin A1c was 11.1% She continues to smoke about a pack of cigarettes a day. 03/28/2017 -- -- right foot x-ray -- IMPRESSION:Areas of soft tissue swelling. Mild subluxation second PIP joint. No frank dislocation or fracture. No erosive change or bony destruction. No soft tissue ulceration or radiopaque foreign body evident by radiography. There is plantar fascia calcification with a nearby inferior calcaneal spur. There are foci of arterial vascular calcification. 04/04/2017 -- the patient continues to be noncompliant and continues to complain of a lot of pain and has not done anything about quitting smoking Readmission: 06/26/18 on evaluation today patient presents for reevaluation she has not been seen in our office since May 2018. Since she was last seen here in our office she has undergone testing at Dr. Fransico Shambhavi Salley office where it was revealed that she had an ABI of 0.68 with her previous ABI being 0.64 and a left ABI of 0.38 with previous ABI being 0.34 this study which was performed on 04/05/18 was pretty much equivalent to the study performed on 11/22/17. The findings in the end revealed on the right would appear to be moderate right lower extremity arterial disease on the left Dr. Gwenlyn Found states moderate in the report but unfortunately this appears to be much more severe at 0.38 compared to the right. Subsequently the patient was scheduled to have angiography with Dr. Gwenlyn Found on 04/12/18. This was however canceled due to the fact that the patient was found to have chronic kidney disease stage III and it was to the point that he did  not feel that it will be safe to pursue angiography at that point. She has not been on any antibiotics recently. At this point Dr. Gwenlyn Found has not rescheduled anything as far as the injured [place] according to the patient he is somewhat reluctant  to do so. Nonetheless with her diminished blood flow this is gonna make it somewhat difficult for her to heal. 07/05/18; this is a patient I have not seen previously. She has very significant PAD as noted above. She apparently has had revascularization efforts by Dr. Gwenlyn Found on the right on 3 occasions to the patient. She was supposed to have an attempted angiography on the left however this was canceled apparently because of stage III chronic renal failure. I will need to research all of this. She complains of significant pain in the wound and has claudication enough that when she walks to the end of the driveway she has to stop. She is been using silver alginate to the wounds on her legs and Iodoflex to the area on her left second toe. 07/13/18 on evaluation today patient's wounds actually appear to be doing about the same. She has an appointment she tells me within the next month that is September 2019 with Dr. Gwenlyn Found to discuss options to see if there's anything else that you can recommend or do for her. Nonetheless obviously what we're trying to do is do what we can to save her leg and in turn prevent any additional worsening or damage. None in the meantime we been mainly trying to manage her ulcers as best we can. 07/27/18; some improvement in the multiplicity of wounds on her left lower extremity and foot. She's been using silver alginate. I was unable to determine that she actually has an appointment with Dr. Gwenlyn Found. We are checking into this The patient's wound includes Left lateral foot, left plantar heel, her left anterior calf wound looks close to me, left fourth toe is very close to closed and the left medial calf is perhaps the largest open area READMISSION 02/19/2021 This is a now 60 year old woman with type 2 diabetes and continued cigarette smoking. She has known PAD. She was last in this clinic in 2019 at that point with wounds on her left foot. She left in a nonhealed state. On 06/28/2019  I see she had a left femoropopliteal by vein and vascular with an amputation of the fifth ray. These managed to heal over. Her last arterial studies were in February 2021. This showed an absent waveform at the posterior tibial artery on the right a dampened monophasic dorsalis pedis on the right of 0.44. On the left again the PTA TA was absent her dorsalis pedis was 0.99 triphasic and her great toe pressure was 0.65. This would have been after her revascularization. Once again she tells me that Dr. Gwenlyn Found has done revascularization on the right leg on 3 different occasions. She has a right transmetatarsal amputation apparently done by Dr. Sharol Given remotely but I do not see a note for these right lower extremity revascularization but I may not of looked back far enough. In any case she says that the she has a wound on the plantar aspect of the right transmetatarsal amputation site there is been there for several months. More recently she fell and had a wound on her right medial lower leg she had 5 sutures placed in the ER and then subsequently she has developed an area on the medial lower leg which was a blister that opened up. Past  medical history includes type 2 diabetes, PAD, chronic renal failure, congestive heart failure, right transmetatarsal amputation, left fifth ray amputation, continued tobacco abuse, atrial fibrillation and cirrhosis. We did not attempt an ABI on the right leg today because of pain 02/26/2021; patient we admitted to the clinic last week. She has what looks to be 2 areas on her right medial and right anterior lower leg that look more like venous wounds but she has 1 on the first met head at the base of her right transmet. She has known severe PAD. She complains of a lot of pain although some of this may be neuropathic. Is difficult to exclude a component of claudication. Use silver alginate on the wounds on her legs and Iodoflex on the area on the first met head. She has an  appointment with Dr. Gwenlyn Found on 5/25 although I will text him and discussed the situation. She is have poorly controlled diabetic. She has has stage IIIb chronic renal failure Objective Constitutional Patient is hypertensive.. Pulse regular and within target range for patient.[name] Respirations regular, non-labored and within target range.. Temperature is normal and within the target range for the patient.[name] Appears in no distress. Vitals Time Taken: 4:03 PM, Temperature: 98.9 F, Pulse: 75 bpm, Respiratory Rate: 20 breaths/min, Blood Pressure: 154/74 mmHg, Capillary Blood Glucose: 400 mg/dl. General Notes: Per patient yesterday blood glucose 365 and today 400. General Notes: Wound exam; ooShe has an area over her first met head plantar on the right. Dime sized wound with very fibrinous debris. I debrided this with a #3 curette she had a surprising amount of bleeding. ooShe had a laceration wound on the right anterior lower leg I am remove the sutures last week. This looks like it is contracted. ooFinally on the right medial lower leg a large blister. This is beginning to epithelialize. Integumentary (Hair, Skin) Wound #16 status is Open. Original cause of wound was Gradually Appeared. The date acquired was: 02/05/2021. The wound has been in treatment 1 weeks. The wound is located on the Right,Plantar Amputation Site - Transmetatarsal. The wound measures 1.3cm length x 1.4cm width x 0.2cm depth; 1.429cm^2 area and 0.286cm^3 volume. There is no tunneling or undermining noted. There is a medium amount of serosanguineous drainage noted. The wound margin is distinct with the outline attached to the wound base. There is no granulation within the wound bed. There is a large (67-100%) amount of necrotic tissue within the wound bed including Adherent Slough. Wound #17 status is Open. Original cause of wound was Blister. The date acquired was: 02/05/2021. The wound has been in treatment 1 weeks. The wound  is located on the Right,Medial Lower Leg. The wound measures 6cm length x 5.6cm width x 0.1cm depth; 26.389cm^2 area and 2.639cm^3 volume. There is Fat Layer (Subcutaneous Tissue) exposed. There is no tunneling or undermining noted. There is a medium amount of serosanguineous drainage noted. The wound margin is distinct with the outline attached to the wound base. There is large (67-100%) red, pink granulation within the wound bed. There is a small (1-33%) amount of necrotic tissue within the wound bed including Adherent Slough. Wound #18 status is Open. Original cause of wound was Trauma. The date acquired was: 02/05/2021. The wound has been in treatment 1 weeks. The wound is located on the Right,Anterior Lower Leg. The wound measures 0.8cm length x 4.5cm width x 0.1cm depth; 2.827cm^2 area and 0.283cm^3 volume. There is Fat Layer (Subcutaneous Tissue) exposed. There is no tunneling or undermining noted. There  is a medium amount of serosanguineous drainage noted. The wound margin is distinct with the outline attached to the wound base. There is small (1-33%) red, pink granulation within the wound bed. There is a large (67-100%) amount of necrotic tissue within the wound bed including Adherent Slough. 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 There was a Excisional Skin/Subcutaneous Tissue Debridement with a total area of 1.82 sq cm performed by Ricard Dillon., MD. With the following  instrument(s): Curette to remove Non-Viable tissue/material. Material removed includes Subcutaneous Tissue and Slough and after achieving pain control using Lidocaine 4% T opical Solution. No specimens were taken. A time out was conducted at 16:23, prior to the start of the procedure. A Minimum amount of bleeding was controlled with Pressure. The procedure was tolerated well. Post Debridement Measurements: 1.3cm length x 1.4cm width x 0.2cm depth; 0.286cm^3 volume. Character of Wound/Ulcer Post Debridement is stable. Severity of Tissue Post Debridement is: Fat layer exposed. Post procedure Diagnosis Wound #16: Same as Pre-Procedure Plan Follow-up Appointments: Return Appointment in 1 week. Bathing/ Shower/ Hygiene: May shower with protection but do not get wound dressing(s) wet. Edema Control - Lymphedema / SCD / Other: Elevate legs to the level of the heart or above for 30 minutes daily and/or when sitting, a frequency of: Avoid standing for long periods of time. Exercise regularly Off-Loading: Open toe surgical shoe to: - with felt padding to offload Additional Orders / Instructions: Stop/Decrease Smoking Follow Nutritious Diet WOUND #16: - Amputation Site - Transmetatarsal Wound Laterality: Plantar, Right Cleanser: Soap and Water 1 x Per Week/15 Days Discharge Instructions: May shower and wash wound with dial antibacterial soap and water prior to dressing change. Prim Dressing: IODOFLEX 0.9% Cadexomer Iodine Pad 4x6 cm 1 x Per Week/15 Days ary Discharge Instructions: Apply to wound bed as instructed Secondary Dressing: Woven Gauze Sponge, Non-Sterile 4x4 in 1 x Per Week/15 Days Discharge Instructions: Apply over primary dressing as directed. Com pression Wrap: Kerlix Roll 4.5x3.1 (in/yd) 1 x Per Week/15 Days Discharge Instructions: Apply Kerlix and Coban compression as directed. Com pression Wrap: Coban Self-Adherent Wrap 4x5 (in/yd) 1 x Per Week/15 Days Discharge Instructions:  Apply over Kerlix as directed. WOUND #17: - Lower Leg Wound Laterality: Right, Medial Cleanser: Soap and Water 1 x Per Week/ Discharge Instructions: May shower and wash wound with dial antibacterial soap and water prior to dressing change. Prim Dressing: KerraCel Ag Gelling Fiber Dressing, 4x5 in (silver alginate) 1 x Per Week/ ary Discharge Instructions: Apply silver alginate to wound bed as instructed Secondary Dressing: Woven Gauze Sponge, Non-Sterile 4x4 in 1 x Per Week/ Discharge Instructions: Apply over primary dressing as directed. Secondary Dressing: ABD Pad, 8x10 1 x Per Week/ Discharge Instructions: Apply over primary dressing as directed. Com pression Wrap: Kerlix Roll 4.5x3.1 (in/yd) 1 x Per Week/ Discharge Instructions: Apply Kerlix and Coban compression as directed. Com pression Wrap: Coban Self-Adherent Wrap 4x5 (in/yd) 1 x Per Week/ Discharge Instructions: Apply over Kerlix as directed. WOUND #18: - Lower Leg Wound Laterality: Right, Anterior Cleanser: Soap and Water  1 x Per Week/ Discharge Instructions: May shower and wash wound with dial antibacterial soap and water prior to dressing change. Prim Dressing: KerraCel Ag Gelling Fiber Dressing, 4x5 in (silver alginate) 1 x Per Week/ ary Discharge Instructions: Apply silver alginate to wound bed as instructed Secondary Dressing: Woven Gauze Sponge, Non-Sterile 4x4 in 1 x Per Week/ Discharge Instructions: Apply over primary dressing as directed. Secondary Dressing: ABD Pad, 8x10 1 x Per Week/ Discharge Instructions: Apply over primary dressing as directed. Com pression Wrap: Kerlix Roll 4.5x3.1 (in/yd) 1 x Per Week/ Discharge Instructions: Apply Kerlix and Coban compression as directed. Com pression Wrap: Coban Self-Adherent Wrap 4x5 (in/yd) 1 x Per Week/ Discharge Instructions: Apply over Kerlix as directed. 1. Still using silver alginate on the areas on her leg which are probably mostly venous/lymphedema wounds 2. The  area on the right first plantar metatarsal head bled a surprising amount today with debridement I am going to continue with Iodoflex in this area. I see no evidence of infection here. This wound does not probe to deep structures 3. The patient tells me she is minimally ambulatory Electronic Signature(s) Signed: 02/26/2021 4:57:27 PM By: Linton Ham MD Entered By: Linton Ham on 02/26/2021 16:34:23 -------------------------------------------------------------------------------- SuperBill Details Patient Name: Date of Service: Karla Corwin, [name] 02/26/2021 Medical Record Number: [id number] Patient Account Number: [id number] Date of Birth/Sex: Treating RN: [birth year] (60 y.o. [name] Primary Care Provider: SYSTEM, [facility] Other Clinician: Referring Provider: Treating Provider/Extender: [name]: 1 Diagnosis Coding ICD-10 Codes Code Description 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.518 Non-pressure chronic ulcer of other part of right foot with other specified severity E11.51 Type 2 diabetes mellitus with diabetic peripheral angiopathy without gangrene L97.811 Non-pressure chronic ulcer of other part of right lower leg limited to breakdown of skin Facility Procedures CPT4 Code: [id number] Description: 63785 - DEB SUBQ TISSUE 20 SQ CM/< ICD-10 Diagnosis Description L97.518 Non-pressure chronic ulcer of other part of right foot with other specified sev 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 2 diabetes mellitus with foot ulcer Modifier: erity Quantity: 1 Physician Procedures : CPT4 Code Description Modifier [id number] - WC PHYS SUBQ TISS 20 SQ CM ICD-10 Diagnosis Description L97.518 Non-pressure chronic ulcer of other part of right foot with other specified severity 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 2 diabetes mellitus with foot ulcer Quantity: 1 Electronic Signature(s) Signed: 02/26/2021 4:57:27 PM By: Linton Ham MD Entered By: Linton Ham on 02/26/2021 16:34:41

## 2021-02-26 NOTE — Progress Notes (Addendum)
KELIS, MIDDAUGH ([id number]) Visit Report for 02/26/2021 Arrival Information Details Patient Name: Date of Service: [name] 02/26/2021 3:45 PM Medical Record Number: [id number] Patient Account Number: [id number] Date of Birth/Sex: Treating RN: 1961/04/08 (60 y.o. [name], [name] Primary Care [name]: SYSTEM, PRO V IDER Other Clinician: Referring [name]: Treating [name]/Extender: [name]: 1 Visit Information History Since Last Visit Added or deleted any medications: No Patient Arrived: [name] Any new allergies or adverse reactions: No Arrival Time: 16:03 Had a fall or experienced change in No Accompanied By: self activities of daily living that may affect Transfer Assistance: None risk of falls: Patient Identification Verified: Yes Signs or symptoms of abuse/neglect since No Secondary Verification Process Completed: Yes last visito Patient Requires Transmission-Based Precautions: No Hospitalized since last visit: No Patient Has Alerts: No Implantable device outside of the clinic No excluding cellular tissue based products placed in the center since last visit: Has Dressing in Place as Prescribed: Yes Has Compression in Place as Prescribed: Yes Has Footwear/Offloading in Place as Yes Prescribed: Right: Surgical Price with Pressure Relief Insole Pain Present Now: Yes Notes Went to ED on 02/26/2021 related to neck pain. Electronic Signature(s) Signed: 02/26/2021 5:54:45 PM By: Deon Pilling Entered By: Deon Pilling on 02/26/2021 16:24:29 -------------------------------------------------------------------------------- Encounter Discharge Information Details Patient Name: Date of Service: [name], [name] 02/26/2021 3:45 PM Medical Record Number: [id number] Patient Account Number: [id number] Date of Birth/Sex: Treating RN: 1961-04-20 (60 y.o. [name] Primary Care [name]: SYSTEM, PRO V IDER Other Clinician: Referring  [name]: Treating [name]/Extender: [name]: 1 Encounter Discharge Information Items Post Procedure Vitals Discharge Condition: Stable Temperature (F): 98.9 Ambulatory Status: Walker Pulse (bpm): 75 Discharge Destination: Home Respiratory Rate (breaths/min): 20 Transportation: Private Auto Blood Pressure (mmHg): 154/74 Accompanied By: self Schedule Follow-up Appointment: Yes Clinical Summary of Care: Electronic Signature(s) Signed: 02/26/2021 5:54:45 PM By: Deon Pilling Entered By: Deon Pilling on 02/26/2021 16:42:40 -------------------------------------------------------------------------------- Lower Extremity Assessment Details Patient Name: Date of Service: [name] 02/26/2021 3:45 PM Medical Record Number: [id number] Patient Account Number: [id number] Date of Birth/Sex: Treating RN: [birth year] (60 y.o. [name] Primary Care [name]: SYSTEM, PRO V IDER Other Clinician: Referring [name]: Treating Kenly Xiao/Extender: [name]: 1 Edema Assessment Assessed: [Left: No] [Right: Yes] Edema: [Left: Ye] [Right: s] Calf Left: Right: Point of Measurement: From Medial Instep 41.2 cm Ankle Left: Right: Point of Measurement: From Medial Instep 21.5 cm Vascular Assessment Pulses: Dorsalis Pedis Palpable: [Right:Yes] Electronic Signature(s) Signed: 02/26/2021 5:54:45 PM By: Deon Pilling Entered By: Deon Pilling on 02/26/2021 16:19:02 -------------------------------------------------------------------------------- Multi Wound Chart Details Patient Name: Date of Service: [name], DO [name] 02/26/2021 3:45 PM Medical Record Number: [id number] Patient Account Number: [id number] Date of Birth/Sex: Treating RN: 1960/12/23 (60 y.o. [name] Primary Care [name]: SYSTEM, PRO Ave Filter Other Clinician: Referring [name]: Treating Panayiotis Rainville/Extender: [name]: 1 Vital  Signs Height(in): Capillary Blood Glucose(mg/dl): 400 Weight(lbs): Pulse(bpm): 83 Body Mass Index(BMI): Blood Pressure(mmHg): 154/74 Temperature(F): 98.9 Respiratory Rate(breaths/min): 20 Photos: [16:No Photos Right, Plantar Amputation Site -] [17:No Photos Right, Medial Lower Leg] [18:No Photos Right, Anterior Lower Leg] Wound Location: [16:Transmetatarsal Gradually Appeared] [17:Blister] [18:Trauma] Wounding Event: [16:Diabetic Wound/Ulcer of the Lower] [17:Diabetic Wound/Ulcer of the Lower] [18:Diabetic Wound/Ulcer of the Lower] Primary Etiology: [16:Extremity Chronic Obstructive Pulmonary] [17:Extremity Chronic Obstructive Pulmonary] [18:Extremity Chronic Obstructive Pulmonary] Comorbid History: [16:Disease (COPD), Sleep Apnea, Arrhythmia, Congestive Heart Failure, Coronary Artery Disease, Hypertension,  Peripheral Arterial Disease, Cirrhosis , Type II Diabetes, Osteoarthritis, Neuropathy 02/05/2021] [17:Disease (COPD), Sleep  Apnea, Arrhythmia, Congestive Heart Failure, Arrhythmia, Congestive Heart Failure, Coronary Artery Disease, Hypertension, Peripheral Arterial Disease, Cirrhosis , Type II Diabetes, Disease, Cirrhosis , Type II Diabetes, Osteoarthritis, Neuropathy  02/05/2021] [18:Disease (COPD), Sleep Apnea, Coronary Artery Disease, Hypertension, Peripheral Arterial Osteoarthritis, Neuropathy 02/05/2021] Date Acquired: [16:1] [17:1] [18:1] Weeks of Treatment: [16:Open] [17:Open] [18:Open] Wound Status: [16:1.3x1.4x0.2] [17:6x5.6x0.1] [18:0.8x4.5x0.1] Measurements L x W x D (cm) [16:1.429] [17:26.389] [18:2.827] A (cm) : rea [16:0.286] [17:2.639] [18:0.283] Volume (cm) : [16:12.50%] [17:4.50%] [18:89.80%] % Reduction in A [16:rea: 41.60%] [17:4.60%] [18:89.80%] % Reduction in Volume: [16:Grade 2] [17:Grade 2] [18:Grade 2] Classification: [16:Medium] [17:Medium] [18:Medium] Exudate A mount: [16:Serosanguineous] [17:Serosanguineous] [18:Serosanguineous] Exudate Type: [16:red, brown]  [17:red, brown] [18:red, brown] Exudate Color: [16:Distinct, outline attached] [17:Distinct, outline attached] [18:Distinct, outline attached] Wound Margin: [16:None Present (0%)] [17:Large (67-100%)] [18:Small (1-33%)] Granulation A mount: [16:N/A] [17:Red, Pink] [18:Red, Pink] Granulation Quality: [16:Large (67-100%)] [17:Small (1-33%)] [18:Large (67-100%)] Necrotic A mount: [16:Fascia: No] [17:Fat Layer (Subcutaneous Tissue): Yes Fat Layer (Subcutaneous Tissue): Yes] Exposed Structures: [16:Fat Layer (Subcutaneous Tissue): No Tendon: No Muscle: No Joint: No Bone: No None] [17:Fascia: No Tendon: No Muscle: No Joint: No Bone: No Small (1-33%)] [18:Fascia: No Tendon: No Muscle: No Joint: No Bone: No Medium (34-66%)] Epithelialization: [16:Debridement - Excisional] [17:N/A] [18:N/A] Debridement: Pre-procedure Verification/Time Out 16:23 [17:N/A] [18:N/A] Taken: [16:Lidocaine 4% Topical Solution] [17:N/A] [18:N/A] Pain Control: [16:Subcutaneous, Slough] [17:N/A] [18:N/A] Tissue Debrided: [16:Skin/Subcutaneous Tissue] [17:N/A] [18:N/A] Level: [16:1.82] [17:N/A] [18:N/A] Debridement A (sq cm): [16:rea Curette] [17:N/A] [18:N/A] Instrument: [16:Minimum] [17:N/A] [18:N/A] Bleeding: [16:Pressure] [17:N/A] [18:N/A] Hemostasis A chieved: [16:Procedure was tolerated well] [17:N/A] [18:N/A] Debridement Treatment Response: [16:1.3x1.4x0.2] [17:N/A] [18:N/A] Post Debridement Measurements L x W x D (cm) [16:0.286] [17:N/A] [18:N/A] Post Debridement Volume: (cm) [16:Debridement] [17:N/A] [18:N/A] Treatment Notes Electronic Signature(s) Signed: 02/26/2021 4:57:27 PM By: Linton Ham MD Signed: 02/26/2021 5:21:53 PM By: Lorrin Jackson Entered By: Linton Ham on 02/26/2021 16:30:09 -------------------------------------------------------------------------------- Multi-Disciplinary Care Plan Details Patient Name: Date of Service: [name], [name] 02/26/2021 3:45 PM Medical Record Number:  [id number] Patient Account Number: [id number] Date of Birth/Sex: Treating RN: 06-24-1961 (60 y.o. [name] Primary Care Marte Celani: SYSTEM, PRO Ave Filter Other Clinician: Referring Dent Plantz: Treating Devaeh Amadi/Extender: [name]: 1 Active Inactive Abuse / Safety / Falls / Self Care Management Nursing Diagnoses: History of Falls Goals: Patient will not experience any injury related to falls Date Initiated: 02/19/2021 Target Resolution Date: 03/26/2021 Goal Status: Active Interventions: Assess Activities of Daily Living upon admission and as needed Assess fall risk on admission and as needed Provide education on fall prevention Notes: Nutrition Nursing Diagnoses: Impaired glucose control: actual or potential Goals: Patient/caregiver verbalizes understanding of need to maintain therapeutic glucose control per primary care physician Date Initiated: 02/19/2021 Target Resolution Date: 03/26/2021 Goal Status: Active Interventions: Assess patient nutrition upon admission and as needed per policy Provide education on elevated blood sugars and impact on wound healing Provide education on nutrition Notes: Orientation to the Wound Care Program Nursing Diagnoses: Knowledge deficit related to the wound healing center program Goals: Patient/caregiver will verbalize understanding of the [facility] Date Initiated: 02/19/2021 Target Resolution Date: 03/19/2021 Goal Status: Active Interventions: Provide education on orientation to the wound center Notes: Wound/Skin Impairment Nursing Diagnoses: Impaired tissue integrity Goals: Patient/caregiver will verbalize understanding of skin care regimen Date Initiated: 02/19/2021 Target Resolution Date: 03/19/2021 Goal Status: Active Ulcer/skin breakdown will have a volume reduction of 30%  by week 4 Date Initiated: 02/19/2021 Target Resolution Date: 03/19/2021 Goal Status: Active Interventions: Assess  patient/caregiver ability to obtain necessary supplies Assess patient/caregiver ability to perform ulcer/skin care regimen upon admission and as needed Assess ulceration(s) every visit Provide education on ulcer and skin care Treatment Activities: Skin care regimen initiated : 02/19/2021 Topical wound management initiated : 02/19/2021 Notes: Electronic Signature(s) Signed: 02/26/2021 3:51:53 PM By: Lorrin Jackson Entered By: Lorrin Jackson on 02/26/2021 15:51:53 -------------------------------------------------------------------------------- Pain Assessment Details Patient Name: Date of Service: Karla Parcel S. 02/26/2021 3:45 PM Medical Record Number: [id number] Patient Account Number: [id number] Date of Birth/Sex: Treating RN: 05/02/1961 (60 y.o. [name] Primary Care Nabeel Gladson: SYSTEM, PRO V IDER Other Clinician: Referring Ferman Basilio: Treating Peaches Vanoverbeke/Extender: [name]: 1 Active Problems Location of Pain Severity and Description of Pain Patient Has Paino Yes Site Locations Pain Location: Generalized Pain, Pain in Ulcers Rate the pain. Current Pain Level: 10 Worst Pain Level: 10 Least Pain Level: 0 Tolerable Pain Level: 9 Pain Management and Medication Current Pain Management: Medication: Yes Cold Application: No Rest: Yes Massage: No Activity: No T.E.N.S.: No Heat Application: No Leg drop or elevation: No Is the Current Pain Management Adequate: Inadequate How does your wound impact your activities of daily livingo Sleep: Yes Bathing: No Appetite: No Relationship With Others: No Bladder Continence: No Emotions: No Bowel Continence: No Work: No Toileting: No Drive: No Dressing: No Hobbies: Yes Electronic Signature(s) Signed: 02/26/2021 5:54:45 PM By: Deon Pilling Entered By: Deon Pilling on 02/26/2021 16:18:44 -------------------------------------------------------------------------------- Patient/Caregiver Education  Details Patient Name: Date of Service: [name] 4/15/2022andnbsp3:45 PM Medical Record Number: [id number] Patient Account Number: [id number] Date of Birth/Gender: Treating RN: 1961-08-16 (60 y.o. [name] Primary Care Physician: SYSTEM, PRO V IDER Other Clinician: Referring Physician: Treating Physician/Extender: [name]: 1 Education Assessment Education Provided To: Patient Education Topics Provided Elevated Blood Sugar/ Impact on Healing: Methods: Explain/Verbal, Printed Responses: State content correctly Offloading: Methods: Explain/Verbal, Printed Responses: State content correctly Wound/Skin Impairment: Methods: Explain/Verbal, Printed Responses: State content correctly Electronic Signature(s) Signed: 02/26/2021 5:21:53 PM By: Lorrin Jackson Entered By: Lorrin Jackson on 02/26/2021 15:52:33 -------------------------------------------------------------------------------- Wound Assessment Details Patient Name: Date of Service: [name], [name] 02/26/2021 3:45 PM Medical Record Number: [id number] Patient Account Number: [id number] Date of Birth/Sex: Treating RN: 02-Jan-1961 (60 y.o. [name], [name] Primary Care Davied Nocito: SYSTEM, PRO V IDER Other Clinician: Referring Cherilynn Schomburg: Treating Geneviene Tesch/Extender: [name]: 1 Wound Status Wound Number: 16 Primary Diabetic Wound/Ulcer of the Lower Extremity Etiology: Wound Location: Right, Plantar Amputation Site - Transmetatarsal Wound Open Wounding Event: Gradually Appeared Status: Date Acquired: 02/05/2021 Comorbid Chronic Obstructive Pulmonary Disease (COPD), Sleep Apnea, Weeks Of Treatment: 1 History: Arrhythmia, Congestive Heart Failure, Coronary Artery Disease, Clustered Wound: No Hypertension, Peripheral Arterial Disease, Cirrhosis , Type II Diabetes, Osteoarthritis, Neuropathy Photos Wound Measurements Length: (cm) 1.3 Width: (cm) 1.4 Depth:  (cm) 0.2 Area: (cm) 1.429 Volume: (cm) 0.286 % Reduction in Area: 12.5% % Reduction in Volume: 41.6% Epithelialization: None Tunneling: No Undermining: No Wound Description Classification: Grade 2 Wound Margin: Distinct, outline attached Exudate Amount: Medium Exudate Type: Serosanguineous Exudate Color: red, brown Foul Odor After Cleansing: No Slough/Fibrino Yes Wound Bed Granulation Amount: None Present (0%) Exposed Structure Necrotic Amount: Large (67-100%) Fascia Exposed: No Necrotic Quality: Adherent Slough Fat Layer (Subcutaneous Tissue) Exposed: No Tendon Exposed: No Muscle Exposed: No Joint Exposed: No Bone Exposed: No Treatment Notes Wound #16 (Amputation Site -  Transmetatarsal) Wound Laterality: Plantar, Right Cleanser Soap and Water Discharge Instruction: May shower and wash wound with dial antibacterial soap and water prior to dressing change. Peri-Wound Care Topical Primary Dressing IODOFLEX 0.9% Cadexomer Iodine Pad 4x6 cm Discharge Instruction: Apply to wound bed as instructed Secondary Dressing Woven Gauze Sponge, Non-Sterile 4x4 in Discharge Instruction: Apply over primary dressing as directed. Secured With Compression Wrap Kerlix Roll 4.5x3.1 (in/yd) Discharge Instruction: Apply Kerlix and Coban compression as directed. Coban Self-Adherent Wrap 4x5 (in/yd) Discharge Instruction: Apply over Kerlix as directed. Compression Stockings Add-Ons Electronic Signature(s) Signed: 02/26/2021 5:54:45 PM By: Deon Pilling Signed: 03/02/2021 8:07:32 AM By: Sandre Kitty Entered By: Sandre Kitty on 02/26/2021 16:35:04 -------------------------------------------------------------------------------- Wound Assessment Details Patient Name: Date of Service: [name], [name] 02/26/2021 3:45 PM Medical Record Number: [id number] Patient Account Number: [id number] Date of Birth/Sex: Treating RN: [birth year] (60 y.o. [name], [name] Primary Care Labrian Torregrossa:  SYSTEM, PRO V IDER Other Clinician: Referring Shahzad Thomann: Treating Jeananne Bedwell/Extender: [name]: 1 Wound Status Wound Number: 17 Primary Diabetic Wound/Ulcer of the Lower Extremity Etiology: Wound Location: Right, Medial Lower Leg Wound Open Wounding Event: Blister Status: Date Acquired: 02/05/2021 Comorbid Chronic Obstructive Pulmonary Disease (COPD), Sleep Apnea, Weeks Of Treatment: 1 History: Arrhythmia, Congestive Heart Failure, Coronary Artery Disease, Clustered Wound: No Clustered Wound: No Hypertension, Peripheral Arterial Disease, Cirrhosis , Type II Diabetes, Osteoarthritis, Neuropathy Photos Wound Measurements Length: (cm) 6 Width: (cm) 5.6 Depth: (cm) 0.1 Area: (cm) 26.389 Volume: (cm) 2.639 % Reduction in Area: 4.5% % Reduction in Volume: 4.6% Epithelialization: Small (1-33%) Tunneling: No Undermining: No Wound Description Classification: Grade 2 Wound Margin: Distinct, outline attached Exudate Amount: Medium Exudate Type: Serosanguineous Exudate Color: red, brown Foul Odor After Cleansing: No Slough/Fibrino Yes Wound Bed Granulation Amount: Large (67-100%) Exposed Structure Granulation Quality: Red, Pink Fascia Exposed: No Necrotic Amount: Small (1-33%) Fat Layer (Subcutaneous Tissue) Exposed: Yes Necrotic Quality: Adherent Slough Tendon Exposed: No Muscle Exposed: No Joint Exposed: No Bone Exposed: No Treatment Notes Wound #17 (Lower Leg) Wound Laterality: Right, Medial Cleanser Soap and Water Discharge Instruction: May shower and wash wound with dial antibacterial soap and water prior to dressing change. Peri-Wound Care Topical Primary Dressing KerraCel Ag Gelling Fiber Dressing, 4x5 in (silver alginate) Discharge Instruction: Apply silver alginate to wound bed as instructed Secondary Dressing Woven Gauze Sponge, Non-Sterile 4x4 in Discharge Instruction: Apply over primary dressing as directed. ABD Pad,  8x10 Discharge Instruction: Apply over primary dressing as directed. Secured With Compression Wrap Kerlix Roll 4.5x3.1 (in/yd) Discharge Instruction: Apply Kerlix and Coban compression as directed. Coban Self-Adherent Wrap 4x5 (in/yd) Discharge Instruction: Apply over Kerlix as directed. Compression Stockings Add-Ons Electronic Signature(s) Signed: 02/26/2021 5:54:45 PM By: Deon Pilling Signed: 03/02/2021 8:07:32 AM By: Sandre Kitty Entered By: Sandre Kitty on 02/26/2021 16:34:46 -------------------------------------------------------------------------------- Wound Assessment Details Patient Name: Date of Service: [name], [name] 02/26/2021 3:45 PM Medical Record Number: [id number] Patient Account Number: [id number] Date of Birth/Sex: Treating RN: [birth year] (60 y.o. [name] Primary Care Ashyr Hedgepath: SYSTEM, PRO V IDER Other Clinician: Referring Alzada Brazee: Treating Jemmie Rhinehart/Extender: [name]: 1 Wound Status Wound Number: 18 Primary Diabetic Wound/Ulcer of the Lower Extremity Etiology: Wound Location: Right, Anterior Lower Leg Wound Open Wounding Event: Trauma Status: Date Acquired: 02/05/2021 Comorbid Chronic Obstructive Pulmonary Disease (COPD), Sleep Apnea, Weeks Of Treatment: 1 History: Arrhythmia, Congestive Heart Failure, Coronary Artery Disease, Clustered Wound: No Hypertension, Peripheral Arterial Disease, Cirrhosis , Type II Diabetes, Osteoarthritis, Neuropathy Photos Wound Measurements Length: (  cm) 0.8 Width: (cm) 4.5 Depth: (cm) 0.1 Area: (cm) 2.827 Volume: (cm) 0.283 % Reduction in Area: 89.8% % Reduction in Volume: 89.8% Epithelialization: Medium (34-66%) Tunneling: No Undermining: No Wound Description Classification: Grade 2 Wound Margin: Distinct, outline attached Exudate Amount: Medium Exudate Type: Serosanguineous Exudate Color: red, brown Foul Odor After Cleansing: No Slough/Fibrino Yes Wound  Bed Granulation Amount: Small (1-33%) Exposed Structure Granulation Quality: Red, Pink Fascia Exposed: No Necrotic Amount: Large (67-100%) Fat Layer (Subcutaneous Tissue) Exposed: Yes Necrotic Quality: Adherent Slough Tendon Exposed: No Muscle Exposed: No Joint Exposed: No Bone Exposed: No Treatment Notes Wound #18 (Lower Leg) Wound Laterality: Right, Anterior Cleanser Soap and Water Discharge Instruction: May shower and wash wound with dial antibacterial soap and water prior to dressing change. Peri-Wound Care Topical Primary Dressing KerraCel Ag Gelling Fiber Dressing, 4x5 in (silver alginate) Discharge Instruction: Apply silver alginate to wound bed as instructed Secondary Dressing Woven Gauze Sponge, Non-Sterile 4x4 in Discharge Instruction: Apply over primary dressing as directed. ABD Pad, 8x10 Discharge Instruction: Apply over primary dressing as directed. Secured With Compression Wrap Kerlix Roll 4.5x3.1 (in/yd) Discharge Instruction: Apply Kerlix and Coban compression as directed. Coban Self-Adherent Wrap 4x5 (in/yd) Discharge Instruction: Apply over Kerlix as directed. Compression Stockings Add-Ons Electronic Signature(s) Signed: 02/26/2021 5:54:45 PM By: Deon Pilling Signed: 03/02/2021 8:07:32 AM By: Sandre Kitty Entered By: Sandre Kitty on 02/26/2021 16:32:38 -------------------------------------------------------------------------------- Vitals Details Patient Name: Date of Service: [name], [name] 02/26/2021 3:45 PM Medical Record Number: [id number] Patient Account Number: [id number] Date of Birth/Sex: Treating RN: 07-25-1961 (60 y.o. [name], [name] Primary Care Aireana Ryland: SYSTEM, PRO V IDER Other Clinician: Referring Neil Errickson: Treating Randol Zumstein/Extender: [name]: 1 Vital Signs Time Taken: 16:03 Temperature (F): 98.9 Pulse (bpm): 75 Respiratory Rate (breaths/min): 20 Blood Pressure (mmHg): 154/74 Capillary  Blood Glucose (mg/dl): 400 Reference Range: 80 - 120 mg / dl Notes Per patient yesterday blood glucose 365 and today 400. Electronic Signature(s) Signed: 02/26/2021 5:54:45 PM By: Deon Pilling Entered By: Deon Pilling on 02/26/2021 16:18:14

## 2021-03-04 ENCOUNTER — Telehealth: Payer: Self-pay

## 2021-03-04 ENCOUNTER — Other Ambulatory Visit (HOSPITAL_COMMUNITY): Payer: Self-pay | Admitting: Cardiology

## 2021-03-04 NOTE — Telephone Encounter (Signed)
Left message for pt to call back regarding change in appointment date and time.

## 2021-03-05 ENCOUNTER — Encounter (HOSPITAL_BASED_OUTPATIENT_CLINIC_OR_DEPARTMENT_OTHER): Payer: Medicare Other | Admitting: Internal Medicine

## 2021-03-05 ENCOUNTER — Other Ambulatory Visit: Payer: Self-pay

## 2021-03-05 DIAGNOSIS — E11621 Type 2 diabetes mellitus with foot ulcer: Secondary | ICD-10-CM | POA: Diagnosis not present

## 2021-03-05 DIAGNOSIS — L97811 Non-pressure chronic ulcer of other part of right lower leg limited to breakdown of skin: Secondary | ICD-10-CM

## 2021-03-05 NOTE — Progress Notes (Signed)
TAMMALA, WEIDER ([id number]) Visit Report for 03/05/2021 Chief Complaint Document Details Patient Name: Date of Service: [name] 03/05/2021 10:15 A M Medical Record Number: [id number] Patient Account Number: [id number] Date of Birth/Sex: Treating RN: [birth year] (60 y.o. [name] Primary Care Provider: SYSTEM, PRO Ave Filter Other Clinician: Referring Provider: Treating Provider/Extender: Yaakov Guthrie in Treatment: 2 Information Obtained from: Patient Chief Complaint Bilateral LE Ulcers Electronic Signature(s) Signed: 03/05/2021 12:28:26 PM By: [name] DO Entered By: [name] on 03/05/2021 12:18:06 -------------------------------------------------------------------------------- Debridement Details Patient Name: Date of Service: [name], [name] 03/05/2021 10:15 A M Medical Record Number: [id number] Patient Account Number: [id number] Date of Birth/Sex: Treating RN: [birth year] (60 y.o. [name] Primary Care Provider: SYSTEM, PRO V IDER Other Clinician: Referring Provider: Treating Provider/Extender: Yaakov Guthrie in Treatment: 2 Debridement Performed for Assessment: Wound #16 Right,Plantar Amputation Site - Transmetatarsal Performed By: Physician [name], DO Debridement Type: Debridement Severity of Tissue Pre Debridement: Fat layer exposed Level of Consciousness (Pre-procedure): Awake and Alert Pre-procedure Verification/Time Out Yes - 11:20 Taken: Start Time: 11:21 Pain Control: Other : Benzocaine T Area Debrided (L x W): otal 1.9 (cm) x 1.4 (cm) = 2.66 (cm) Tissue and other material debrided: Non-Viable, Subcutaneous Level: Skin/Subcutaneous Tissue Debridement Description: Excisional Instrument: Curette Bleeding: Minimum Hemostasis Achieved: Pressure End Time: 11:27 Response to Treatment: Procedure was tolerated well Level of Consciousness (Post- Awake and Alert procedure): Post Debridement Measurements  of Total Wound Length: (cm) 1.9 Width: (cm) 1.4 Depth: (cm) 0.5 Volume: (cm) 1.045 Character of Wound/Ulcer Post Debridement: Stable Severity of Tissue Post Debridement: Fat layer exposed Post Procedure Diagnosis Same as Pre-procedure Electronic Signature(s) Signed: 03/05/2021 12:28:26 PM By: [name] DO Signed: 03/05/2021 5:01:44 PM By: Lorrin Jackson Entered By: Lorrin Jackson on 03/05/2021 11:31:11 -------------------------------------------------------------------------------- HPI Details Patient Name: Date of Service: [name], [name] 03/05/2021 10:15 A M Medical Record Number: [id number] Patient Account Number: [id number] Date of Birth/Sex: Treating RN: 10/31/1961 (60 y.o. [name] Primary Care Provider: SYSTEM, PRO [employer] Filter Other Clinician: Referring Provider: Treating Provider/Extender: Yaakov Guthrie in Treatment: 2 History of Present Illness HPI Description: This 60 year old patient who has a very long significant history of diabetes mellitus, previous alcohol and nicotine abuse, chronic data disease, COPD, diabetes mellitus, height hypertension, critical lower limb ischemia with several wounds being managed at the wound Center at [facility] for over a year. She was recently in the ER at [facility] and was referred to our center. He has had a long history of critical limb ischemia and over a period of time has had balloon angioplasties in March 2016, endarterectomy of the left carotid, lower extremity angiogram and treatment by Dr. Quay Burow, several cardiac catheterizations. Most recently she had an x-ray of her left foot while in the ER which showed no acute abnormality. during this ER visit she was started on ciprofloxacin and asked to continue with the wound care physicians at [facility]. Her last ABI done in June 2017 showed the right side to be 0.28 in the left side to be 0.48. Her right toe brachial index was 0.17 on the right and  0.27 on the left. her last hemoglobin A1c was 11.1% She continues to smoke about a pack of cigarettes a day. 03/28/2017 -- -- right foot x-ray -- IMPRESSION:Areas of soft tissue swelling. Mild subluxation second PIP joint. No frank dislocation or fracture. No erosive change or bony destruction. No soft tissue ulceration  or radiopaque foreign body evident by radiography. There is plantar fascia calcification with a nearby inferior calcaneal spur. There are foci of arterial vascular calcification. 04/04/2017 -- the patient continues to be noncompliant and continues to complain of a lot of pain and has not done anything about quitting smoking Readmission: 06/26/18 on evaluation today patient presents for reevaluation she has not been seen in our office since May 2018. Since she was last seen here in our office she has undergone testing at Dr. Elspeth Cho office where it was revealed that she had an ABI of 0.68 with her previous ABI being 0.64 and a left ABI of 0.38 with previous ABI being 0.34 this study which was performed on 04/05/18 was pretty much equivalent to the study performed on 11/22/17. The findings in the end revealed on the right would appear to be moderate right lower extremity arterial disease on the left Dr. Gwenlyn Found states moderate in the report but unfortunately this appears to be much more severe at 0.38 compared to the right. Subsequently the patient was scheduled to have angiography with Dr. Gwenlyn Found on 04/12/18. This was however canceled due to the fact that the patient was found to have chronic kidney disease stage III and it was to the point that he did not feel that it will be safe to pursue angiography at that point. She has not been on any antibiotics recently. At this point Dr. Gwenlyn Found has not rescheduled anything as far as the injured [place] according to the patient he is somewhat reluctant to do so. Nonetheless with her diminished blood flow this is gonna make it somewhat difficult  for her to heal. 07/05/18; this is a patient I have not seen previously. She has very significant PAD as noted above. She apparently has had revascularization efforts by Dr. Gwenlyn Found on the right on 3 occasions to the patient. She was supposed to have an attempted angiography on the left however this was canceled apparently because of stage III chronic renal failure. I will need to research all of this. She complains of significant pain in the wound and has claudication enough that when she walks to the end of the driveway she has to stop. She is been using silver alginate to the wounds on her legs and Iodoflex to the area on her left second toe. 07/13/18 on evaluation today patient's wounds actually appear to be doing about the same. She has an appointment she tells me within the next month that is September 2019 with Dr. Gwenlyn Found to discuss options to see if there's anything else that you can recommend or do for her. Nonetheless obviously what we're trying to do is do what we can to save her leg and in turn prevent any additional worsening or damage. None in the meantime we been mainly trying to manage her ulcers as best we can. 07/27/18; some improvement in the multiplicity of wounds on her left lower extremity and foot. She's been using silver alginate. I was unable to determine that she actually has an appointment with Dr. Gwenlyn Found. We are checking into this The patient's wound includes Left lateral foot, left plantar heel, her left anterior calf wound looks close to me, left fourth toe is very close to closed and the left medial calf is perhaps the largest open area READMISSION 02/19/2021 This is a now 60 year old woman with type 2 diabetes and continued cigarette smoking. She has known PAD. She was last in this clinic in 2019 at that point with wounds on  her left foot. She left in a nonhealed state. On 06/28/2019 I see she had a left femoropopliteal by vein and vascular with an amputation of the  fifth ray. These managed to heal over. Her last arterial studies were in February 2021. This showed an absent waveform at the posterior tibial artery on the right a dampened monophasic dorsalis pedis on the right of 0.44. On the left again the PTA TA was absent her dorsalis pedis was 0.99 triphasic and her great toe pressure was 0.65. This would have been after her revascularization. Once again she tells me that Dr. Gwenlyn Found has done revascularization on the right leg on 3 different occasions. She has a right transmetatarsal amputation apparently done by Dr. Sharol Given remotely but I do not see a note for these right lower extremity revascularization but I may not of looked back far enough. In any case she says that the she has a wound on the plantar aspect of the right transmetatarsal amputation site there is been there for several months. More recently she fell and had a wound on her right medial lower leg she had 5 sutures placed in the ER and then subsequently she has developed an area on the medial lower leg which was a blister that opened up. Past medical history includes type 2 diabetes, PAD, chronic renal failure, congestive heart failure, right transmetatarsal amputation, left fifth ray amputation, continued tobacco abuse, atrial fibrillation and cirrhosis. We did not attempt an ABI on the right leg today because of pain 02/26/2021; patient we admitted to the clinic last week. She has what looks to be 2 areas on her right medial and right anterior lower leg that look more like venous wounds but she has 1 on the first met head at the base of her right transmet. She has known severe PAD. She complains of a lot of pain although some of this may be neuropathic. Is difficult to exclude a component of claudication. Use silver alginate on the wounds on her legs and Iodoflex on the area on the first met head. She has an appointment with Dr. Gwenlyn Found on 5/25 although I will text him and discussed the situation.  She is have poorly controlled diabetic. She has has stage IIIb chronic renal failure 4/22; patient presents for 1 week follow-up. The 2 areas on her right medial and right anterior lower leg appear well-healing. She has been using silver alginate to this area without issues. She has a first met head ulcer and it is unclear how long this has been there as it was discovered in the ED earlier this month when she was being evaluated for another issue. Iodoflex has been used at this area. Electronic Signature(s) Signed: 03/05/2021 12:28:26 PM By: [name] DO Entered By: [name] on 03/05/2021 12:20:27 -------------------------------------------------------------------------------- Physical Exam Details Patient Name: Date of Service: [name], [name] 03/05/2021 10:15 A M Medical Record Number: [id number] Patient Account Number: [id number] Date of Birth/Sex: Treating RN: 1960-12-19 (60 y.o. [name] Primary Care Provider: SYSTEM, PRO Ave Filter Other Clinician: Referring Provider: Treating Provider/Extender: Yaakov Guthrie in Treatment: 2 Constitutional respirations regular, non-labored and within target range for patient.[name] Psychiatric pleasant and cooperative. Notes Plantar right first met head: Small open wound with fibrinous debris. This was debrided with a curette. Right lower extremity: There are 2 abrasions to her anterior and and medial leg that appear well-healing. No signs of infection to any wounds Electronic Signature(s) Signed: 03/05/2021 12:28:26 PM By: [name] DO  Entered By: [name] on 03/05/2021 12:22:08 -------------------------------------------------------------------------------- Physician Orders Details Patient Name: Date of Service: HA LEY, [name] 03/05/2021 10:15 A M Medical Record Number: [id number] Patient Account Number: [id number] Date of Birth/Sex: Treating RN: May 19, 1961 (60 y.o. [name] Primary Care  Provider: SYSTEM, PRO Ave Filter Other Clinician: Referring Provider: Treating Provider/Extender: Yaakov Guthrie in Treatment: 2 Verbal / Phone Orders: No Diagnosis Coding ICD-10 Coding Code Description 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.518 Non-pressure chronic ulcer of other part of right foot with other specified severity E11.51 Type 2 diabetes mellitus with diabetic peripheral angiopathy without gangrene L97.811 Non-pressure chronic ulcer of other part of right lower leg limited to breakdown of skin Follow-up Appointments Return Appointment in 1 week. Bathing/ Shower/ Hygiene May shower with protection but do not get wound dressing(s) wet. Edema Control - Lymphedema / SCD / Other Elevate legs to the level of the heart or above for 30 minutes daily and/or when sitting, a frequency of: Avoid standing for long periods of time. Exercise regularly Off-Loading Open toe surgical shoe to: - with felt padding to offload Additional Orders / Instructions Stop/Decrease Smoking Follow Nutritious Diet Wound Treatment Wound #16 - Amputation Site - Transmetatarsal Wound Laterality: Plantar, Right Cleanser: Soap and Water 1 x Per Week/15 Days Discharge Instructions: May shower and wash wound with dial antibacterial soap and water prior to dressing change. Prim Dressing: IODOFLEX 0.9% Cadexomer Iodine Pad 4x6 cm 1 x Per Week/15 Days ary Discharge Instructions: Apply to wound bed as instructed Secondary Dressing: Woven Gauze Sponge, Non-Sterile 4x4 in 1 x Per Week/15 Days Discharge Instructions: Apply over primary dressing as directed. Compression Wrap: Kerlix Roll 4.5x3.1 (in/yd) 1 x Per Week/15 Days Discharge Instructions: Apply Kerlix and Coban compression as directed. Compression Wrap: Coban Self-Adherent Wrap 4x5 (in/yd) 1 x Per Week/15 Days Discharge Instructions: Apply over Kerlix as directed. Wound #17 - Lower Leg Wound Laterality: Right, Medial Cleanser: Soap  and Water 1 x Per Week Discharge Instructions: May shower and wash wound with dial antibacterial soap and water prior to dressing change. Prim Dressing: KerraCel Ag Gelling Fiber Dressing, 4x5 in (silver alginate) 1 x Per Week ary Discharge Instructions: Apply silver alginate to wound bed as instructed Secondary Dressing: Woven Gauze Sponge, Non-Sterile 4x4 in 1 x Per Week Discharge Instructions: Apply over primary dressing as directed. Secondary Dressing: ABD Pad, 8x10 1 x Per Week Discharge Instructions: Apply over primary dressing as directed. Compression Wrap: Kerlix Roll 4.5x3.1 (in/yd) 1 x Per Week Discharge Instructions: Apply Kerlix and Coban compression as directed. Compression Wrap: Coban Self-Adherent Wrap 4x5 (in/yd) 1 x Per Week Discharge Instructions: Apply over Kerlix as directed. Wound #18 - Lower Leg Wound Laterality: Right, Anterior Cleanser: Soap and Water 1 x Per Week Discharge Instructions: May shower and wash wound with dial antibacterial soap and water prior to dressing change. Prim Dressing: KerraCel Ag Gelling Fiber Dressing, 4x5 in (silver alginate) 1 x Per Week ary Discharge Instructions: Apply silver alginate to wound bed as instructed Secondary Dressing: Woven Gauze Sponge, Non-Sterile 4x4 in 1 x Per Week Discharge Instructions: Apply over primary dressing as directed. Secondary Dressing: ABD Pad, 8x10 1 x Per Week Discharge Instructions: Apply over primary dressing as directed. Compression Wrap: Kerlix Roll 4.5x3.1 (in/yd) 1 x Per Week Discharge Instructions: Apply Kerlix and Coban compression as directed. Compression Wrap: Coban Self-Adherent Wrap 4x5 (in/yd) 1 x Per Week Discharge Instructions: Apply over Kerlix as directed. Electronic Signature(s) Signed: 03/05/2021 12:28:26 PM  By: [name] DO Previous Signature: 03/05/2021 11:09:55 AM Version By: Lorrin Jackson Entered By: [name] on 03/05/2021  12:22:32 -------------------------------------------------------------------------------- Problem List Details Patient Name: Date of Service: [name], [name] 03/05/2021 10:15 A M Medical Record Number: [id number] Patient Account Number: [id number] Date of Birth/Sex: Treating RN: 04/16/1961 (60 y.o. [name] Primary Care Provider: SYSTEM, PRO Ave Filter Other Clinician: Referring Provider: Treating Provider/Extender: Yaakov Guthrie in Treatment: 2 Active Problems ICD-10 Encounter Code Description Active Date MDM Diagnosis 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 2 diabetes mellitus with foot ulcer 02/19/2021 No Yes L97.518 Non-pressure chronic ulcer of other part of right foot with other specified 02/19/2021 No Yes severity E11.51 Type 2 diabetes mellitus with diabetic peripheral angiopathy without gangrene 02/19/2021 No Yes L97.811 Non-pressure chronic ulcer of other part of right lower leg limited to breakdown 02/19/2021 No Yes of skin Inactive Problems Resolved Problems Electronic Signature(s) Signed: 03/05/2021 12:28:26 PM By: [name] DO Previous Signature: 03/05/2021 11:09:11 AM Version By: Lorrin Jackson Entered By: [name] on 03/05/2021 12:17:45 -------------------------------------------------------------------------------- Progress Note Details Patient Name: Date of Service: [name], [name] 03/05/2021 10:15 A M Medical Record Number: [id number] Patient Account Number: [id number] Date of Birth/Sex: Treating RN: Feb 07, 1961 (60 y.o. [name] Primary Care Provider: SYSTEM, PRO V IDER Other Clinician: Referring Provider: Treating Provider/Extender: Yaakov Guthrie in Treatment: 2 Subjective Chief Complaint Information obtained from Patient Bilateral LE Ulcers History of Present Illness (HPI) This 60 year old patient who has a very long significant history of diabetes mellitus, previous alcohol and nicotine abuse, chronic data disease, COPD, diabetes  mellitus, height hypertension, critical lower limb ischemia with several wounds being managed at the wound Center at [facility] for over a year. She was recently in the ER at [facility] and was referred to our center. He has had a long history of critical limb ischemia and over a period of time has had balloon angioplasties in March 2016, endarterectomy of the left carotid, lower extremity angiogram and treatment by Dr. Quay Burow, several cardiac catheterizations. Most recently she had an x-ray of her left foot while in the ER which showed no acute abnormality. during this ER visit she was started on ciprofloxacin and asked to continue with the wound care physicians at [facility]. Her last ABI done in June 2017 showed the right side to be 0.28 in the left side to be 0.48. Her right toe brachial index was 0.17 on the right and 0.27 on the left. her last hemoglobin A1c was 11.1% She continues to smoke about a pack of cigarettes a day. 03/28/2017 -- -- right foot x-ray -- IMPRESSION:Areas of soft tissue swelling. Mild subluxation second PIP joint. No frank dislocation or fracture. No erosive change or bony destruction. No soft tissue ulceration or radiopaque foreign body evident by radiography. There is plantar fascia calcification with a nearby inferior calcaneal spur. There are foci of arterial vascular calcification. 04/04/2017 -- the patient continues to be noncompliant and continues to complain of a lot of pain and has not done anything about quitting smoking Readmission: 06/26/18 on evaluation today patient presents for reevaluation she has not been seen in our office since May 2018. Since she was last seen here in our office she has undergone testing at Dr. Fransico Michael office where it was revealed that she had an ABI of 0.68 with her previous ABI being 0.64 and a left ABI of 0.38 with previous ABI being 0.34 this study which was performed on 04/05/18  was pretty much equivalent  to the study performed on 11/22/17. The findings in the end revealed on the right would appear to be moderate right lower extremity arterial disease on the left Dr. Gwenlyn Found states moderate in the report but unfortunately this appears to be much more severe at 0.38 compared to the right. Subsequently the patient was scheduled to have angiography with Dr. Gwenlyn Found on 04/12/18. This was however canceled due to the fact that the patient was found to have chronic kidney disease stage III and it was to the point that he did not feel that it will be safe to pursue angiography at that point. She has not been on any antibiotics recently. At this point Dr. Gwenlyn Found has not rescheduled anything as far as the injured [place] according to the patient he is somewhat reluctant to do so. Nonetheless with her diminished blood flow this is gonna make it somewhat difficult for her to heal. 07/05/18; this is a patient I have not seen previously. She has very significant PAD as noted above. She apparently has had revascularization efforts by Dr. Gwenlyn Found on the right on 3 occasions to the patient. She was supposed to have an attempted angiography on the left however this was canceled apparently because of stage III chronic renal failure. I will need to research all of this. She complains of significant pain in the wound and has claudication enough that when she walks to the end of the driveway she has to stop. She is been using silver alginate to the wounds on her legs and Iodoflex to the area on her left second toe. 07/13/18 on evaluation today patient's wounds actually appear to be doing about the same. She has an appointment she tells me within the next month that is September 2019 with Dr. Gwenlyn Found to discuss options to see if there's anything else that you can recommend or do for her. Nonetheless obviously what we're trying to do is do what we can to save her leg and in turn prevent any additional worsening or damage. None in the  meantime we been mainly trying to manage her ulcers as best we can. 07/27/18; some improvement in the multiplicity of wounds on her left lower extremity and foot. She's been using silver alginate. I was unable to determine that she actually has an appointment with Dr. Gwenlyn Found. We are checking into this The patient's wound includes Left lateral foot, left plantar heel, her left anterior calf wound looks close to me, left fourth toe is very close to closed and the left medial calf is perhaps the largest open area READMISSION 02/19/2021 This is a now 60 year old woman with type 2 diabetes and continued cigarette smoking. She has known PAD. She was last in this clinic in 2019 at that point with wounds on her left foot. She left in a nonhealed state. On 06/28/2019 I see she had a left femoropopliteal by vein and vascular with an amputation of the fifth ray. These managed to heal over. Her last arterial studies were in February 2021. This showed an absent waveform at the posterior tibial artery on the right a dampened monophasic dorsalis pedis on the right of 0.44. On the left again the PTA TA was absent her dorsalis pedis was 0.99 triphasic and her great toe pressure was 0.65. This would have been after her revascularization. Once again she tells me that Dr. Gwenlyn Found has done revascularization on the right leg on 3 different occasions. She has a right transmetatarsal amputation apparently done by  Dr. Sharol Given remotely but I do not see a note for these right lower extremity revascularization but I may not of looked back far enough. In any case she says that the she has a wound on the plantar aspect of the right transmetatarsal amputation site there is been there for several months. More recently she fell and had a wound on her right medial lower leg she had 5 sutures placed in the ER and then subsequently she has developed an area on the medial lower leg which was a blister that opened up. Past medical history  includes type 2 diabetes, PAD, chronic renal failure, congestive heart failure, right transmetatarsal amputation, left fifth ray amputation, continued tobacco abuse, atrial fibrillation and cirrhosis. We did not attempt an ABI on the right leg today because of pain 02/26/2021; patient we admitted to the clinic last week. She has what looks to be 2 areas on her right medial and right anterior lower leg that look more like venous wounds but she has 1 on the first met head at the base of her right transmet. She has known severe PAD. She complains of a lot of pain although some of this may be neuropathic. Is difficult to exclude a component of claudication. Use silver alginate on the wounds on her legs and Iodoflex on the area on the first met head. She has an appointment with Dr. Gwenlyn Found on 5/25 although I will text him and discussed the situation. She is have poorly controlled diabetic. She has has stage IIIb chronic renal failure 4/22; patient presents for 1 week follow-up. The 2 areas on her right medial and right anterior lower leg appear well-healing. She has been using silver alginate to this area without issues. She has a first met head ulcer and it is unclear how long this has been there as it was discovered in the ED earlier this month when she was being evaluated for another issue. Iodoflex has been used at this area. Patient History Information obtained from Patient. Family History Cancer - Mother, Diabetes - Mother, Heart Disease - Mother,Father, Hypertension - Mother,Father, Lung Disease - Siblings, Stroke - Mother, Thyroid Problems - Mother, No family history of Hereditary Spherocytosis, Kidney Disease, Seizures, Tuberculosis. Social History Former smoker, Marital Status - Divorced, Alcohol Use - Never, Drug Use - Current History - marijuana, Caffeine Use - Daily - Coffee. Medical History Eyes Denies history of Cataracts, Glaucoma, Optic Neuritis Ear/Nose/Mouth/Throat Denies history  of Chronic sinus problems/congestion, Middle ear problems Hematologic/Lymphatic Denies history of Anemia, Hemophilia, Human Immunodeficiency Virus, Lymphedema, Sickle Cell Disease Respiratory Patient has history of Chronic Obstructive Pulmonary Disease (COPD), Sleep Apnea Denies history of Aspiration, Asthma, Pneumothorax, Tuberculosis Cardiovascular Patient has history of Arrhythmia - NSVT , PAF , PAT Congestive Heart Failure - chronic combined, Coronary Artery Disease, Hypertension, Peripheral , Arterial Disease Denies history of Angina, Deep Vein Thrombosis, Hypotension, Myocardial Infarction, Peripheral Venous Disease, Phlebitis, Vasculitis Gastrointestinal Patient has history of Cirrhosis - alcoholic Denies history of Colitis, Crohnoos, Hepatitis A, Hepatitis B, Hepatitis C Endocrine Patient has history of Type II Diabetes Denies history of Type I Diabetes Genitourinary Denies history of End Stage Renal Disease Immunological Denies history of Lupus Erythematosus, Raynaudoos, Scleroderma Integumentary (Skin) Denies history of History of Burn Musculoskeletal Patient has history of Osteoarthritis Denies history of Gout, Rheumatoid Arthritis, Osteomyelitis Neurologic Patient has history of Neuropathy - dm2 peripheral Denies history of Dementia, Quadriplegia, Paraplegia, Seizure Disorder Oncologic Denies history of Received Chemotherapy, Received Radiation Psychiatric Denies history of Anorexia/bulimia, Confinement  Anxiety Hospitalization/Surgery History - balloon angioplasty. - cardiac cath X2. - cardioversionX2. - carotid angiogram. - DandC uterus. - endarcdectomy (L). - (L) heart cath. - lower ext angiogram X2. - peripheral athrectomy. Medical A Surgical History Notes nd Constitutional Symptoms (General Health) alcohol abuse , cocaine abuse , marijuana abuse , narcotic abuse , noncompliance , obesity , tobacco abuse , insomnia , Hematologic/Lymphatic B12  deficiency Respiratory wheezing , Cardiovascular cardiomyopathy , critical lower limb ischemia , elevated troponin , hyperlipidemia , hypokalemia , hypomagnesiumia , symptomatic bradycardia , carotid arterial disease ,demand ischemia , Gastrointestinal GERD Endocrine hypothyroidism Genitourinary renal insufficiency , CKD Stage 3 , Integumentary (Skin) cellulitis Psychiatric anxiety Objective Constitutional respirations regular, non-labored and within target range for patient.. Vitals Time Taken: 10:48 AM, Temperature: 99 F, Pulse: 74 bpm, Respiratory Rate: 17 breaths/min, Blood Pressure: 145/70 mmHg, Capillary Blood Glucose: 224 mg/dl. Psychiatric pleasant and cooperative. General Notes: Plantar right first met head: Small open wound with fibrinous debris. This was debrided with a curette. Right lower extremity: There are 2 abrasions to her anterior and and medial leg that appear well-healing. No signs of infection to any wounds Integumentary (Hair, Skin) Wound #16 status is Open. Original cause of wound was Gradually Appeared. The date acquired was: 02/05/2021. The wound has been in treatment 2 weeks. The wound is located on the Right,Plantar Amputation Site - Transmetatarsal. The wound measures 1.9cm length x 1.4cm width x 0.5cm depth; 2.089cm^2 area and 1.045cm^3 volume. There is no tunneling or undermining noted. There is a medium amount of serosanguineous drainage noted. The wound margin is distinct with the outline attached to the wound base. There is small (1-33%) red, pink granulation within the wound bed. There is a large (67-100%) amount of necrotic tissue within the wound bed including Adherent Slough. Wound #17 status is Open. Original cause of wound was Blister. The date acquired was: 02/05/2021. The wound has been in treatment 2 weeks. The wound is located on the Right,Medial Lower Leg. The wound measures 4cm length x 3.5cm width x 0.1cm depth; 10.996cm^2 area and  1.1cm^3 volume. There is Fat Layer (Subcutaneous Tissue) exposed. There is no tunneling or undermining noted. There is a medium amount of serosanguineous drainage noted. The wound margin is distinct with the outline attached to the wound base. There is large (67-100%) red, pink granulation within the wound bed. There is a small (1-33%) amount of necrotic tissue within the wound bed including Adherent Slough. Wound #18 status is Open. Original cause of wound was Trauma. The date acquired was: 02/05/2021. The wound has been in treatment 2 weeks. The wound is located on the Right,Anterior Lower Leg. The wound measures 0.5cm length x 4cm width x 0.2cm depth; 1.571cm^2 area and 0.314cm^3 volume. There is Fat Layer (Subcutaneous Tissue) exposed. There is no tunneling or undermining noted. There is a medium amount of serosanguineous drainage noted. The wound margin is distinct with the outline attached to the wound base. There is small (1-33%) red, pink granulation within the wound bed. There is a large (67-100%) amount of necrotic tissue within the wound bed including Adherent Slough. Assessment Active Problems ICD-10 Type 2 diabetes mellitus with foot ulcer Non-pressure chronic ulcer of other part of right foot with other specified severity Type 2 diabetes mellitus with diabetic peripheral angiopathy without gangrene Non-pressure chronic ulcer of other part of right lower leg limited to breakdown of skin Patient has right lower extremity wounds that appear well-healing. We will continue with silver alginate. No signs of infection.  The plantar ulcer had fibrinous tissue that was debrided. We will continue with Iodoflex. We will see her back in a week. Procedures Wound #16 Pre-procedure diagnosis of Wound #16 is a Diabetic Wound/Ulcer of the Lower Extremity located on the Right,Plantar Amputation Site - Transmetatarsal .Severity of Tissue Pre Debridement is: Fat layer exposed. There was a Excisional  Skin/Subcutaneous Tissue Debridement with a total area of 2.66 sq cm performed by [name], DO. With the following instrument(s): Curette to remove Non-Viable tissue/material. Material removed includes Subcutaneous Tissue after achieving pain control using Other (Benzocaine). No specimens were taken. A time out was conducted at 11:20, prior to the start of the procedure. A Minimum amount of bleeding was controlled with Pressure. The procedure was tolerated well. Post Debridement Measurements: 1.9cm length x 1.4cm width x 0.5cm depth; 1.045cm^3 volume. Character of Wound/Ulcer Post Debridement is stable. Severity of Tissue Post Debridement is: Fat layer exposed. Post procedure Diagnosis Wound #16: Same as Pre-Procedure Plan Follow-up Appointments: Return Appointment in 1 week. Bathing/ Shower/ Hygiene: May shower with protection but do not get wound dressing(s) wet. Edema Control - Lymphedema / SCD / Other: Elevate legs to the level of the heart or above for 30 minutes daily and/or when sitting, a frequency of: Avoid standing for long periods of time. Exercise regularly Off-Loading: Open toe surgical shoe to: - with felt padding to offload Additional Orders / Instructions: Stop/Decrease Smoking Follow Nutritious Diet WOUND #16: - Amputation Site - Transmetatarsal Wound Laterality: Plantar, Right Cleanser: Soap and Water 1 x Per Week/15 Days Discharge Instructions: May shower and wash wound with dial antibacterial soap and water prior to dressing change. Prim Dressing: IODOFLEX 0.9% Cadexomer Iodine Pad 4x6 cm 1 x Per Week/15 Days ary Discharge Instructions: Apply to wound bed as instructed Secondary Dressing: Woven Gauze Sponge, Non-Sterile 4x4 in 1 x Per Week/15 Days Discharge Instructions: Apply over primary dressing as directed. Com pression Wrap: Kerlix Roll 4.5x3.1 (in/yd) 1 x Per Week/15 Days Discharge Instructions: Apply Kerlix and Coban compression as directed. Com  pression Wrap: Coban Self-Adherent Wrap 4x5 (in/yd) 1 x Per Week/15 Days Discharge Instructions: Apply over Kerlix as directed. WOUND #17: - Lower Leg Wound Laterality: Right, Medial Cleanser: Soap and Water 1 x Per Week/ Discharge Instructions: May shower and wash wound with dial antibacterial soap and water prior to dressing change. Prim Dressing: KerraCel Ag Gelling Fiber Dressing, 4x5 in (silver alginate) 1 x Per Week/ ary Discharge Instructions: Apply silver alginate to wound bed as instructed Secondary Dressing: Woven Gauze Sponge, Non-Sterile 4x4 in 1 x Per Week/ Discharge Instructions: Apply over primary dressing as directed. Secondary Dressing: ABD Pad, 8x10 1 x Per Week/ Discharge Instructions: Apply over primary dressing as directed. Com pression Wrap: Kerlix Roll 4.5x3.1 (in/yd) 1 x Per Week/ Discharge Instructions: Apply Kerlix and Coban compression as directed. Com pression Wrap: Coban Self-Adherent Wrap 4x5 (in/yd) 1 x Per Week/ Discharge Instructions: Apply over Kerlix as directed. WOUND #18: - Lower Leg Wound Laterality: Right, Anterior Cleanser: Soap and Water 1 x Per Week/ Discharge Instructions: May shower and wash wound with dial antibacterial soap and water prior to dressing change. Prim Dressing: KerraCel Ag Gelling Fiber Dressing, 4x5 in (silver alginate) 1 x Per Week/ ary Discharge Instructions: Apply silver alginate to wound bed as instructed Secondary Dressing: Woven Gauze Sponge, Non-Sterile 4x4 in 1 x Per Week/ Discharge Instructions: Apply over primary dressing as directed. Secondary Dressing: ABD Pad, 8x10 1 x Per Week/ Discharge Instructions: Apply over primary dressing  as directed. Com pression Wrap: Kerlix Roll 4.5x3.1 (in/yd) 1 x Per Week/ Discharge Instructions: Apply Kerlix and Coban compression as directed. Com pression Wrap: Coban Self-Adherent Wrap 4x5 (in/yd) 1 x Per Week/ Discharge Instructions: Apply over Kerlix as directed. 1. Silver alginate  every other day to the right lower extremity wounds 2. Iodoflex to the ulcer on the plantar aspect of the right foot 3. Follow-up in 1 week 4. Sharp debridement to the ulcer Electronic Signature(s) Signed: 03/05/2021 12:28:26 PM By: [name] DO Entered By: [name] on 03/05/2021 12:25:16 -------------------------------------------------------------------------------- HxROS Details Patient Name: Date of Service: [name], [name] 03/05/2021 10:15 A M Medical Record Number: [id number] Patient Account Number: [id number] Date of Birth/Sex: Treating RN: 05-18-1961 (60 y.o. [name] Primary Care Provider: SYSTEM, [facility] [address] [facility] Other Clinician: Referring Provider: Treating Provider/Extender: Yaakov Guthrie in Treatment: 2 Information Obtained From Patient Constitutional Symptoms (General Health) Medical History: Past Medical History Notes: alcohol abuse , cocaine abuse , marijuana abuse , narcotic abuse , noncompliance , obesity , tobacco abuse , insomnia , Eyes Medical History: Negative for: Cataracts; Glaucoma; Optic Neuritis Ear/Nose/Mouth/Throat Medical History: Negative for: Chronic sinus problems/congestion; Middle ear problems Hematologic/Lymphatic Medical History: Negative for: Anemia; Hemophilia; Human Immunodeficiency Virus; Lymphedema; Sickle Cell Disease Past Medical History Notes: B12 deficiency Respiratory Medical History: Positive for: Chronic Obstructive Pulmonary Disease (COPD); Sleep Apnea Negative for: Aspiration; Asthma; Pneumothorax; Tuberculosis Past Medical History Notes: wheezing , Cardiovascular Medical History: Positive for: Arrhythmia - NSVT , PAF , PAT Congestive Heart Failure - chronic combined; Coronary Artery Disease; Hypertension; Peripheral Arterial ; Disease Negative for: Angina; Deep Vein Thrombosis; Hypotension; Myocardial Infarction; Peripheral Venous Disease; Phlebitis; Vasculitis Past Medical History  Notes: cardiomyopathy , critical lower limb ischemia , elevated troponin , hyperlipidemia , hypokalemia , hypomagnesiumia , symptomatic bradycardia , carotid arterial disease ,demand ischemia , Gastrointestinal Medical History: Positive for: Cirrhosis - alcoholic Negative for: Colitis; Crohns; Hepatitis A; Hepatitis B; Hepatitis C Past Medical History Notes: GERD Endocrine Medical History: Positive for: Type II Diabetes Negative for: Type I Diabetes Past Medical History Notes: hypothyroidism Time with diabetes: 5 years Treated with: Insulin Blood sugar tested every day: Yes Tested : 3x a day Genitourinary Medical History: Negative for: End Stage Renal Disease Past Medical History Notes: renal insufficiency , CKD Stage 3 , Immunological Medical History: Negative for: Lupus Erythematosus; Raynauds; Scleroderma Integumentary (Skin) Medical History: Negative for: History of Burn Past Medical History Notes: cellulitis Musculoskeletal Medical History: Positive for: Osteoarthritis Negative for: Gout; Rheumatoid Arthritis; Osteomyelitis Neurologic Medical History: Positive for: Neuropathy - dm2 peripheral Negative for: Dementia; Quadriplegia; Paraplegia; Seizure Disorder Oncologic Medical History: Negative for: Received Chemotherapy; Received Radiation Psychiatric Medical History: Negative for: Anorexia/bulimia; Confinement Anxiety Past Medical History Notes: anxiety Immunizations Pneumococcal Vaccine: Received Pneumococcal Vaccination: Yes Tetanus Vaccine: Last tetanus shot: 10/14/1981 Implantable Devices None Hospitalization / Surgery History Type of Hospitalization/Surgery balloon angioplasty cardiac cath X2 cardioversionX2 carotid angiogram DandC uterus endarcdectomy (L) (L) heart cath lower ext angiogram X2 peripheral athrectomy Family and Social History Cancer: Yes - Mother; Diabetes: Yes - Mother; Heart Disease: Yes - Mother,Father; Hereditary  Spherocytosis: No; Hypertension: Yes - Mother,Father; Kidney Disease: No; Lung Disease: Yes - Siblings; Seizures: No; Stroke: Yes - Mother; Thyroid Problems: Yes - Mother; Tuberculosis: No; Former smoker; Marital Status - Divorced; Alcohol Use: Never; Drug Use: Current History - marijuana; Caffeine Use: Daily - Coffee; Financial Concerns: No; Food, Clothing or Shelter Needs: No; Support System Lacking: No; Transportation Concerns: No Electronic Signature(s)  Signed: 03/05/2021 12:28:26 PM By: [name] DO Signed: 03/05/2021 5:01:44 PM By: Lorrin Jackson Entered By: [name] on 03/05/2021 12:20:36 -------------------------------------------------------------------------------- SuperBill Details Patient Name: Date of Service: [name], [name] 03/05/2021 Medical Record Number: [id number] Patient Account Number: [id number] Date of Birth/Sex: Treating RN: 27-Jul-1961 (60 y.o. [name] Primary Care Provider: SYSTEM, [facility] [address] [facility] Other Clinician: Referring Provider: Treating Provider/Extender: Yaakov Guthrie in Treatment: 2 Diagnosis Coding ICD-10 Codes Code Description 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.518 Non-pressure chronic ulcer of other part of right foot with other specified severity E11.51 Type 2 diabetes mellitus with diabetic peripheral angiopathy without gangrene L97.811 Non-pressure chronic ulcer of other part of right lower leg limited to breakdown of skin Facility Procedures CPT4 Code: [id number] Description: 29924 - DEB SUBQ TISSUE 20 SQ CM/< ICD-10 Diagnosis Description L97.518 Non-pressure chronic ulcer of other part of right foot with other specified sev 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 2 diabetes mellitus with foot ulcer Modifier: erity Quantity: 1 Physician Procedures Electronic Signature(s) Signed: 03/05/2021 12:28:26 PM By: [name] DO Entered By: [name] on 03/05/2021 12:28:02

## 2021-03-08 ENCOUNTER — Other Ambulatory Visit: Payer: Self-pay

## 2021-03-08 ENCOUNTER — Ambulatory Visit (HOSPITAL_BASED_OUTPATIENT_CLINIC_OR_DEPARTMENT_OTHER)
Admission: RE | Admit: 2021-03-08 | Discharge: 2021-03-08 | Disposition: A | Discharge status: Home / Self Care | Payer: Medicare Other | Source: Ambulatory Visit | Attending: Cardiology | Admitting: Cardiology

## 2021-03-08 ENCOUNTER — Encounter (HOSPITAL_COMMUNITY): Payer: Self-pay | Admitting: Cardiology

## 2021-03-08 ENCOUNTER — Ambulatory Visit (HOSPITAL_COMMUNITY)
Admission: RE | Admit: 2021-03-08 | Discharge: 2021-03-08 | Disposition: A | Discharge status: Home / Self Care | Payer: Medicare Other | Source: Ambulatory Visit | Attending: Cardiology | Admitting: Cardiology

## 2021-03-08 DIAGNOSIS — L97519 Non-pressure chronic ulcer of other part of right foot with unspecified severity: Secondary | ICD-10-CM | POA: Insufficient documentation

## 2021-03-08 DIAGNOSIS — I2510 Atherosclerotic heart disease of native coronary artery without angina pectoris: Secondary | ICD-10-CM | POA: Diagnosis not present

## 2021-03-08 DIAGNOSIS — I5042 Chronic combined systolic (congestive) and diastolic (congestive) heart failure: Secondary | ICD-10-CM | POA: Diagnosis not present

## 2021-03-08 DIAGNOSIS — Z7951 Long term (current) use of inhaled steroids: Secondary | ICD-10-CM | POA: Diagnosis not present

## 2021-03-08 DIAGNOSIS — E1122 Type 2 diabetes mellitus with diabetic chronic kidney disease: Secondary | ICD-10-CM | POA: Insufficient documentation

## 2021-03-08 DIAGNOSIS — I480 Paroxysmal atrial fibrillation: Secondary | ICD-10-CM | POA: Diagnosis not present

## 2021-03-08 DIAGNOSIS — Z7901 Long term (current) use of anticoagulants: Secondary | ICD-10-CM | POA: Diagnosis not present

## 2021-03-08 DIAGNOSIS — K7460 Unspecified cirrhosis of liver: Secondary | ICD-10-CM | POA: Insufficient documentation

## 2021-03-08 DIAGNOSIS — Z794 Long term (current) use of insulin: Secondary | ICD-10-CM | POA: Insufficient documentation

## 2021-03-08 DIAGNOSIS — Z79899 Other long term (current) drug therapy: Secondary | ICD-10-CM | POA: Diagnosis not present

## 2021-03-08 DIAGNOSIS — I130 Hypertensive heart and chronic kidney disease with heart failure and stage 1 through stage 4 chronic kidney disease, or unspecified chronic kidney disease: Secondary | ICD-10-CM | POA: Diagnosis not present

## 2021-03-08 DIAGNOSIS — Z87891 Personal history of nicotine dependence: Secondary | ICD-10-CM | POA: Insufficient documentation

## 2021-03-08 DIAGNOSIS — E11621 Type 2 diabetes mellitus with foot ulcer: Secondary | ICD-10-CM | POA: Diagnosis not present

## 2021-03-08 DIAGNOSIS — E038 Other specified hypothyroidism: Secondary | ICD-10-CM | POA: Insufficient documentation

## 2021-03-08 DIAGNOSIS — G4733 Obstructive sleep apnea (adult) (pediatric): Secondary | ICD-10-CM | POA: Diagnosis not present

## 2021-03-08 DIAGNOSIS — E1151 Type 2 diabetes mellitus with diabetic peripheral angiopathy without gangrene: Secondary | ICD-10-CM | POA: Insufficient documentation

## 2021-03-08 DIAGNOSIS — J449 Chronic obstructive pulmonary disease, unspecified: Secondary | ICD-10-CM | POA: Insufficient documentation

## 2021-03-08 DIAGNOSIS — N183 Chronic kidney disease, stage 3 unspecified: Secondary | ICD-10-CM | POA: Diagnosis not present

## 2021-03-08 DIAGNOSIS — E782 Mixed hyperlipidemia: Secondary | ICD-10-CM | POA: Insufficient documentation

## 2021-03-08 DIAGNOSIS — I739 Peripheral vascular disease, unspecified: Secondary | ICD-10-CM

## 2021-03-08 LAB — COMPREHENSIVE METABOLIC PANEL
ALT: 19 U/L (ref 0–44)
AST: 20 U/L (ref 15–41)
Albumin: 2.9 g/dL — ABNORMAL LOW (ref 3.5–5.0)
Alkaline Phosphatase: 162 U/L — ABNORMAL HIGH (ref 38–126)
Anion gap: 9 (ref 5–15)
BUN: 29 mg/dL — ABNORMAL HIGH (ref 6–20)
CO2: 32 mmol/L (ref 22–32)
Calcium: 8.6 mg/dL — ABNORMAL LOW (ref 8.9–10.3)
Chloride: 96 mmol/L — ABNORMAL LOW (ref 98–111)
Creatinine, Ser: 1.38 mg/dL — ABNORMAL HIGH (ref 0.44–1.00)
GFR, Estimated: 44 mL/min — ABNORMAL LOW (ref >60)
Glucose, Bld: 187 mg/dL — ABNORMAL HIGH (ref 70–99)
Potassium: 3.5 mmol/L (ref 3.5–5.1)
Sodium: 137 mmol/L (ref 135–145)
Total Bilirubin: 0.7 mg/dL (ref 0.3–1.2)
Total Protein: 6.7 g/dL (ref 6.5–8.1)

## 2021-03-08 LAB — ECHOCARDIOGRAM COMPLETE
Area-P 1/2: 3.6 cm2
MV M vel: 5.24 m/s
MV Peak grad: 109.8 mmHg
S' Lateral: 3.6 cm

## 2021-03-08 LAB — LIPID PANEL
Cholesterol: 129 mg/dL (ref 0–200)
HDL: 47 mg/dL (ref >40)
LDL Cholesterol: 59 mg/dL (ref 0–99)
Total CHOL/HDL Ratio: 2.7 RATIO
Triglycerides: 115 mg/dL (ref <150)
VLDL: 23 mg/dL (ref 0–40)

## 2021-03-08 LAB — TSH: TSH: 2.921 u[IU]/mL (ref 0.350–4.500)

## 2021-03-08 MED ORDER — TORSEMIDE 20 MG PO TABS: ORAL_TABLET | ORAL | 11 refills | Status: DC

## 2021-03-08 MED ORDER — HYDRALAZINE HCL 25 MG PO TABS: 25.0000 mg | ORAL_TABLET | Freq: Three times a day (TID) | ORAL | 11 refills | Status: DC

## 2021-03-08 NOTE — Progress Notes (Signed)
  Echocardiogram 2D Echocardiogram has been performed.  [name] 03/08/2021, 3:03 PM

## 2021-03-08 NOTE — Patient Instructions (Signed)
Labs done today. We will contact you only if your labs are abnormal.  STOP taking Plavix  INCREASE Hydralazine to '25mg'$  (1 tablet) by mouth 3 times daily.  INCREASE Torsemide to '80mg'$  (4 tablets) by mouth every morning and '60mg'$  (3 tablets) by mouth every evening.   No other medication changes were made. Please continue all current medications as prescribed.  Your physician recommends that you schedule a follow-up appointment in: 2 weeks with our [facility] here in office.    If you have any questions or concerns before your next appointment please send [place] a message through [place] or call our office at [phone].    TO LEAVE A MESSAGE FOR THE NURSE SELECT OPTION 2, PLEASE LEAVE A MESSAGE INCLUDING: . YOUR NAME . DATE OF BIRTH . CALL BACK NUMBER . REASON FOR CALL**this is important as we prioritize the call backs  YOU WILL RECEIVE A CALL BACK THE SAME DAY AS LONG AS YOU CALL BEFORE 4:00 PM   Do the following things EVERYDAY: 1) Weigh yourself in the morning before breakfast. Write it down and keep it in a log. 2) Take your medicines as prescribed 3) Eat low salt foods--Limit salt (sodium) to 2000 mg per day.  4) Stay as active as you can everyday 5) Limit all fluids for the day to less than 2 liters   At the [facility], you and your health needs are our priority. As part of our continuing mission to provide you with exceptional heart care, we have created designated Provider Care Teams. These Care Teams include your primary Cardiologist (physician) and Advanced Practice Providers (APPs- Physician Assistants and Nurse Practitioners) who all work together to provide you with the care you need, when you need it.   You may see any of the following providers on your designated Care Team at your next follow up: [name] Dr Glori Bickers . Dr Loralie Champagne . Darrick Grinder, NP . Lyda Jester, PA . Audry Riles, PharmD   Please be sure to bring in all your medications  bottles to every appointment.

## 2021-03-09 ENCOUNTER — Telehealth (HOSPITAL_COMMUNITY): Payer: Self-pay | Admitting: Adult Health

## 2021-03-09 NOTE — Progress Notes (Signed)
Patient ID: [name], female   DOB: May 04, 1961, 60 y.o.   MRN: [id number]    Advanced Heart Failure Clinic Note   PCP: Dr Roxy Manns HF Cardiology: Dr. Aundra Dubin PV: Dr. Gwenlyn Found  Ms Square is a 60 y.o. with history of PAD, carotid stenosis s/p left CEA, relatively mild CAD, chronic systolic CHF, paroxysmal atrial fibrillation and prior substance abuse presents for CHF clinic followup.  She was admitted in 1/17 with acute hypoxemic respiratory failure in the setting of atrial fibrillation/RVR and volume overload.  She was initially intubated.  She converted back to NSR with amiodarone gtt.  She was treated with IV Lasix, steroids, bronchodilators.  She developed AKI and losartan was stopped.    She was admitted in 3/17 with symptomatic bradycardia.  She was supposed to stop diltiazem after this admission but continued to take it.  She presented later in 3/17, again with symptomatic bradycardia (junctional bradycardia), hypotension, and AKI.  Diltiazem and bisoprolol were stopped.  HR recovered and she has had no problems since.  ACEI was stopped with AKI.   In 5/18, she had peripheral angiography with atherectomy of right SFA.  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 7/18 ABIs showed 0.65 on right, 0.31 on left.  In 8/20, she had peripheral angiography showing totally occluded left SFA.  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 Donnetta Hutching.  ABIs in 2/21 were 0.44 on right, 0.99 on left.   Echo in 1/21 showed EF 50%, mild LVH, normal RV, PASP 60 mmHg.   Echo was done today and reviewed, EF 55-60% with basal to mid inferolateral hypokinesis.   She does not walk much post right transmetatarsal amputation.  She has been told to stay off her right foot as much as possible for now (has a blister on the foot being treated at the wound center).  No chest pain.  No dyspnea walking slowly around the house.  Recently hospitalized (4/22) for  exacerbation of COPD.  BP borderline elevated. She continues to smoke 1 ppd.     Cardiomems: Last reading from 4/23 showed PADP 26 (goal 20)  Labs (1/17): K 5.2, creatinine 1.4, AST 43, ALT normal Labs (2/17): K 4.2, creatinine 1.3, BNP 607, TSH normal Labs (3/17): K 4.2, creatinine 1.45, AST/ALT normal, HCT 28.7 Labs (4/17): K 4.4, creatinine 1.61 Labs (08/31/2016): K 4.5 Creatinine 1.43  Labs (11/17): K 4.2, creatinine 1.4 Labs (9/18): K 4.1, creatinine 1.1, TSH normal Labs 04/13/2018: K 5 Creatinine 1.75 Labs (2/21): K 4.1, creatinine 1.58 Labs (4/22): K 3.7, creatinine 1.55  PMH: 1. Carotid stenosis: Known occluded right carotid.  Left CEA in 4/16.  - Carotid dopplers (7/21): CTO RICA, mild disease LICA.  2. CAD: LHC in 12/15 with 80% stenosis in small OM1, nonobstructive disease in other territories.  3. Chronic systolic CHF: Nonischemic cardiomyopathy (?due to ETOH or prior drug abuse).  She has a Cardiomems.  - Echo (1/17) with EF 45%, mild LV hypertrophy, moderate diastolic dysfunction, inferolateral severe hypokinesis, mildly decreased RV systolic function.  - Echo (1/18): EF 40-45%, mild LVH, normal RV size and systolic function.  - Echo (1/21): EF 50%, mild LVH, normal RV, PASP 60 mmHg.  - Echo (4/22): EF 55-60%, basal-mid inferolateral hypokinesis with grade II diastolic dysfunction, RV normal, PASP 42 4. Atrial fibrillation: Paroxysmal.   5. Type II diabetes 6. CKD: Stage III. 7. COPD: Smokes 1 ppd.  8.  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  No longer drinks.  9. Hypothyroidism 10. PAD: Atherectomy SFA in 2014 (Dr Gwenlyn Found).  Peripheral arterial dopplers (2/17) with focal 75-99% proximal right SFA stenosis, occluded mid-distal right SFA, chronic occlusion of all runoff arteries on right. - In 5/18, she had peripheral angiography with atherectomy of right SFA.   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  - 7/18 ABIs  showed 0.65 on right, 0.31 on left.   - Peripheral angiography 8/20: Totally occluded left SFA.  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 Donnetta Hutching.   - ABIs (2/21): 0.44 R, 0.99 L.  11. Anemia 12. Prior cocaine abuse.  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  14. OSA: Has not been able to tolerate CPAP.   SH: Lives with sister.  Prior cocaine abuse.  Prior ETOH abuse.  Quit smoking in 1/17, restarted, and quit again in 4/19, restarted again.  FH: CAD  Review of systems complete and found to be negative unless listed in HPI.    Current Outpatient Medications  Medication Sig Dispense Refill  . ACCU-CHEK GUIDE test strip USE TO CHECK BLOOD SUGAR FOUR TIMES DAILY    . albuterol (VENTOLIN HFA) 108 (90 Base) MCG/ACT inhaler Inhale 2 puffs into the lungs every 6 (six) hours as needed for wheezing or shortness of breath. 18 g 0  . amiodarone (PACERONE) 100 MG tablet Take 100 mg by mouth daily.    [name] amiodarone (PACERONE) 200 MG tablet Take 0.5 tablets (100 mg total) by mouth daily. 45 tablet 3  . amLODipine (NORVASC) 10 MG tablet Take 1 tablet (10 mg total) by mouth daily. 90 tablet 3  . apixaban (ELIQUIS) 5 MG TABS tablet TAKE 1 TABLET(5 MG) BY MOUTH TWICE DAILY 180 tablet 3  . atorvastatin (LIPITOR) 40 MG tablet Take 40 mg by mouth every evening.    . bisoprolol (ZEBETA) 5 MG tablet Take 1 tablet (5 mg total) by mouth daily. 90 tablet 3  . budesonide-formoterol (SYMBICORT) 160-4.5 MCG/ACT inhaler Inhale 2 puffs into the lungs 2 (two) times daily. 1 Inhaler 2  . Dulaglutide (TRULICITY [place]) Inject [id number] mg into the skin once a week.    . folic acid (FOLVITE) 1 MG tablet Take 1 tablet (1 mg total) by mouth daily. 30 tablet 0  . insulin glargine (LANTUS) 100 UNIT/ML Solostar Pen INJECT 20 UNITS INTO THE SKIN DAILY. 15 mL 0  . insulin lispro (HUMALOG) 100 UNIT/ML KwikPen Inject 8 Units into the skin 3 (three) times daily with meals. 15 mL 0  . Insulin  Pen Needle 32G X 4 MM MISC USE AS DIRECTED WITH INSULIN PENS 100 each 0  . levothyroxine (SYNTHROID, LEVOTHROID) 50 MCG tablet Take 1 tablet (50 mcg total) by mouth daily. 30 tablet 0  . losartan (COZAAR) 100 MG tablet Take 1 tablet (100 mg total) by mouth daily. 90 tablet 3  . methocarbamol (ROBAXIN) 500 MG tablet Take 1 tablet (500 mg total) by mouth every 8 (eight) hours as needed for muscle spasms. 30 tablet 0  . Multiple Vitamins-Minerals (VITAMIN D3 COMPLETE PO) Take 1 capsule by mouth daily in the afternoon.    [name] omega-3 fish oil (MAXEPA) 1000 MG CAPS capsule Take 1 capsule by mouth daily.    . pantoprazole (PROTONIX) 40 MG tablet Take 1 tablet (40 mg total) by mouth daily.    . cyclobenzaprine (FLEXERIL)  10 MG tablet Take 1 tablet by mouth at bedtime.    . hydrALAZINE (APRESOLINE) 25 MG tablet Take 1 tablet (25 mg total) by mouth 3 (three) times daily. 90 tablet 11  . insulin aspart (NOVOLOG) 100 UNIT/ML FlexPen INJECT 8 UNITS INTO THE SKIN THREE TIMES DAILY WITH MEALS. 15 mL 0  . JARDIANCE 10 MG TABS tablet TAKE 1 TABLET BY MOUTH  DAILY BEFORE BREAKFAST 90 tablet 3  . torsemide (DEMADEX) 20 MG tablet Take 4 tablets (80 mg total) by mouth every morning AND 3 tablets (60 mg total) every evening. 210 tablet 11   No current facility-administered medications for this encounter.   There were no vitals filed for this visit.   Wt Readings from Last 3 Encounters:  02/23/21 106.6 kg (235 lb)  01/03/21 103.5 kg (228 lb 2.8 oz)  12/24/20 103.4 kg (228 lb)    Physical Exam  General: NAD Neck: JVP 10 cm, no thyromegaly or thyroid nodule.  Lungs: Rhonchi bilaterally.  CV: Nondisplaced PMI.  Heart regular S1/S2, no S3/S4, no murmur.  2+ edema to knees.  No carotid bruit.  Unable to palpate pedal pulses.  Abdomen: Soft, nontender, no hepatosplenomegaly, no distention.  Skin: Intact without lesions or rashes.  Neurologic: Alert and oriented x 3.  Psych: Normal affect. Extremities: No clubbing  or cyanosis. S/p right TMA.  HEENT: Normal.   Assessment/Plan: 1. Chronic diastolic CHF: Echo today showed EF 55-60% with grade 2 diastolic dysfunction.   LHC in 12/15 showing only 80% stenosis in small OM1.  EF as low as 40-45% in the past.  She is volume overloaded today on exam with elevated PADP recently by Cardiomems.  Not very active, NYHA class II-III symptoms.  - Increase torsemide to 80 qam/60 qpm with BMET today and in 10 days.   - Continue bisoprolol 5 mg daily.  - Continue losartan 100 mg daily.  - Continue Jardiance 10 mg daily.  - Needs to be more regular with Cardiomems use.   2. Atrial fibrillation: Paroxysmal. She is in NSR today.  - Continue amiodarone 100 mg daily. Check LFTs today.  He is on levothyroxine, followed by PCP.  Needs regular eye exams. - Continue Eliquis 5 mg bid.   3. PAD: Claudication bilaterally.  Not candidate for cilostazol with CHF.  She has had a left fem-pop bypass in 8/20.  ABIs in 2/21 were very abnormal on right.  She also has an ulceration on her right foot.  - At this point, she can stop Plavix as she is on Eliquis.   - Followup with Drs Gwenlyn Found and Early for PAD.  - Continue atorvastatin, check lipids today.  - Continue with wound clinic.  4. COPD:  Continues to smoke, encouraged to quit.   5. H/o cirrhosis: Likely from ETOH, now abstinent.  6. OSA: Not able to tolerate CPAP.   7. CKD stage III:  BMET today. 8. HTN: BP high, increase hydralazine to 25 mg tid.   9.Type 2 diabetes: Per PCP.   10. CAD: LHC in 12/15 with 80% stenosis in small OM1, nonobstructive disease in other territories. - Continue statin.  - Given Eliquis use, I think she can stop Plavix.   Followup with APP in 2 wks to reassess volume.    Loralie Champagne, MD  03/09/2021

## 2021-03-09 NOTE — Telephone Encounter (Signed)
Cardiomems Goal 20   Todays reading 28.   Called and left message to increase torsemide to 80 mg twice a day then back to 80 mg/60 mg   [name] NpC-  12:13 PM

## 2021-03-12 ENCOUNTER — Encounter (HOSPITAL_BASED_OUTPATIENT_CLINIC_OR_DEPARTMENT_OTHER): Payer: Medicare Other | Admitting: Internal Medicine

## 2021-03-12 ENCOUNTER — Other Ambulatory Visit: Payer: Self-pay

## 2021-03-12 DIAGNOSIS — E1151 Type 2 diabetes mellitus with diabetic peripheral angiopathy without gangrene: Secondary | ICD-10-CM | POA: Diagnosis not present

## 2021-03-12 DIAGNOSIS — E11621 Type 2 diabetes mellitus with foot ulcer: Secondary | ICD-10-CM

## 2021-03-12 DIAGNOSIS — L97811 Non-pressure chronic ulcer of other part of right lower leg limited to breakdown of skin: Secondary | ICD-10-CM | POA: Diagnosis not present

## 2021-03-12 DIAGNOSIS — L97518 Non-pressure chronic ulcer of other part of right foot with other specified severity: Secondary | ICD-10-CM | POA: Diagnosis not present

## 2021-03-12 NOTE — Progress Notes (Signed)
**Note Karla-Identified via Obfuscation** [name], [name] ([id number]) Visit Report for 03/12/2021 Chief Complaint Document Details Patient Name: Date of Service: [name] 03/12/2021 1:00 PM Medical Record Number: [id number] Patient Account Number: [id number] Date of Birth/Sex: Treating RN: Jul 03, 1961 (60 y.o. [name] Primary Care Provider: SYSTEM, PRO Ave Filter Other Clinician: Referring Provider: Treating Provider/Extender: Yaakov Guthrie in Treatment: 3 Information Obtained from: Patient Chief Complaint Bilateral LE Ulcers Electronic Signature(s) Signed: 03/12/2021 2:54:46 PM By: [name] DO Entered By: [name] on 03/12/2021 14:50:05 -------------------------------------------------------------------------------- Debridement Details Patient Name: Date of Service: Karla Corwin, [name] 03/12/2021 1:00 PM Medical Record Number: [id number] Patient Account Number: [id number] Date of Birth/Sex: Treating RN: 1960/11/16 (60 y.o. [name] Primary Care Provider: SYSTEM, PRO V IDER Other Clinician: Referring Provider: Treating Provider/Extender: Yaakov Guthrie in Treatment: 3 Debridement Performed for Assessment: Wound #17 Right,Medial Lower Leg Performed By: Physician [name], DO Debridement Type: Debridement Severity of Tissue Pre Debridement: Fat layer exposed Level of Consciousness (Pre-procedure): Awake and Alert Pre-procedure Verification/Time Out Yes - 14:24 Taken: Start Time: 14:25 Pain Control: Lidocaine 4% T opical Solution T Area Debrided (L x W): otal 0.5 (cm) x 0.5 (cm) = 0.25 (cm) Tissue and other material debrided: Non-Viable, Subcutaneous Level: Skin/Subcutaneous Tissue Debridement Description: Excisional Instrument: Curette Bleeding: Minimum Hemostasis Achieved: Pressure End Time: 14:28 Response to Treatment: Procedure was tolerated well Level of Consciousness (Post- Awake and Alert procedure): Post Debridement Measurements of Total  Wound Length: (cm) 0.5 Width: (cm) 0.5 Depth: (cm) 0.1 Volume: (cm) 0.02 Character of Wound/Ulcer Post Debridement: Stable Severity of Tissue Post Debridement: Fat layer exposed Post Procedure Diagnosis Same as Pre-procedure Electronic Signature(s) Signed: 03/12/2021 2:54:46 PM By: [name] DO Signed: 03/12/2021 5:13:52 PM By: Lorrin Jackson Entered By: Lorrin Jackson on 03/12/2021 14:35:20 -------------------------------------------------------------------------------- Debridement Details Patient Name: Date of Service: Karla Corwin, [name] 03/12/2021 1:00 PM Medical Record Number: [id number] Patient Account Number: [id number] Date of Birth/Sex: Treating RN: 01/15/1961 (60 y.o. [name] Primary Care Provider: SYSTEM, PRO V IDER Other Clinician: Referring Provider: Treating Provider/Extender: Yaakov Guthrie in Treatment: 3 Debridement Performed for Assessment: Wound #18 Right,Anterior Lower Leg Performed By: Physician [name], DO Debridement Type: Debridement Severity of Tissue Pre Debridement: Fat layer exposed Level of Consciousness (Pre-procedure): Awake and Alert Pre-procedure Verification/Time Out Yes - 14:24 Taken: Start Time: 14:28 Pain Control: Lidocaine 4% T opical Solution T Area Debrided (L x W): otal 0.2 (cm) x 0.2 (cm) = 0.04 (cm) Tissue and other material debrided: Non-Viable, Subcutaneous Level: Skin/Subcutaneous Tissue Debridement Description: Excisional Instrument: Curette Bleeding: Minimum Hemostasis Achieved: Pressure End Time: 14:30 Response to Treatment: Procedure was tolerated well Level of Consciousness (Post- Awake and Alert procedure): Post Debridement Measurements of Total Wound Length: (cm) 0.2 Width: (cm) 0.2 Depth: (cm) 0.1 Volume: (cm) 0.003 Character of Wound/Ulcer Post Debridement: Stable Severity of Tissue Post Debridement: Fat layer exposed Post Procedure Diagnosis Same as Pre-procedure Electronic  Signature(s) Signed: 03/12/2021 2:54:46 PM By: [name] DO Signed: 03/12/2021 5:13:52 PM By: Lorrin Jackson Entered By: Lorrin Jackson on 03/12/2021 14:36:08 -------------------------------------------------------------------------------- Debridement Details Patient Name: Date of Service: Karla Corwin, [name] 03/12/2021 1:00 PM Medical Record Number: [id number] Patient Account Number: [id number] Date of Birth/Sex: Treating RN: [birth year] (60 y.o. [name] Primary Care Provider: SYSTEM, PRO Ave Filter Other Clinician: Referring Provider: Treating Provider/Extender: Yaakov Guthrie in Treatment: 3 Debridement Performed for Assessment: Wound #16 Right,Plantar Amputation Site - Transmetatarsal Performed By: Physician [name], DO  Debridement Type: Debridement Severity of Tissue Pre Debridement: Fat layer exposed Level of Consciousness (Pre-procedure): Awake and Alert Pre-procedure Verification/Time Out Yes - 14:24 Taken: Start Time: 14:28 Pain Control: Lidocaine 4% T opical Solution T Area Debrided (L x W): otal 1.1 (cm) x 1.2 (cm) = 1.32 (cm) Tissue and other material debrided: Non-Viable, Callus, Subcutaneous Level: Skin/Subcutaneous Tissue Debridement Description: Excisional Instrument: Curette Bleeding: Minimum Hemostasis Achieved: Pressure End Time: 14:32 Response to Treatment: Procedure was tolerated well Level of Consciousness (Post- Awake and Alert procedure): Post Debridement Measurements of Total Wound Length: (cm) 1.1 Width: (cm) 1.2 Depth: (cm) 0.4 Volume: (cm) 0.415 Character of Wound/Ulcer Post Debridement: Stable Severity of Tissue Post Debridement: Fat layer exposed Post Procedure Diagnosis Same as Pre-procedure Electronic Signature(s) Signed: 03/12/2021 2:54:46 PM By: [name] DO Signed: 03/12/2021 5:13:52 PM By: Lorrin Jackson Entered By: Lorrin Jackson on 03/12/2021  14:37:07 -------------------------------------------------------------------------------- HPI Details Patient Name: Date of Service: Karla Corwin, [name] 03/12/2021 1:00 PM Medical Record Number: [id number] Patient Account Number: [id number] Date of Birth/Sex: Treating RN: September 16, 1961 (60 y.o. [name] Primary Care Provider: SYSTEM, PRO Ave Filter Other Clinician: Referring Provider: Treating Provider/Extender: Yaakov Guthrie in Treatment: 3 History of Present Illness HPI Description: This 60 year old patient who has a very long significant history of diabetes mellitus, previous alcohol and nicotine abuse, chronic data disease, COPD, diabetes mellitus, height hypertension, critical lower limb ischemia with several wounds being managed at the wound Center at [facility] for over a year. She was recently in the ER at [facility] and was referred to our center. He has had a long history of critical limb ischemia and over a period of time has had balloon angioplasties in March 2016, endarterectomy of the left carotid, lower extremity angiogram and treatment by Dr. Quay Burow, several cardiac catheterizations. Most recently she had an x-ray of her left foot while in the ER which showed no acute abnormality. during this ER visit she was started on ciprofloxacin and asked to continue with the wound care physicians at [facility]. Her last ABI done in June 2017 showed the right side to be 0.28 in the left side to be 0.48. Her right toe brachial index was 0.17 on the right and 0.27 on the left. her last hemoglobin A1c was 11.1% She continues to smoke about a pack of cigarettes a day. 03/28/2017 -- -- right foot x-ray -- IMPRESSION:Areas of soft tissue swelling. Mild subluxation second PIP joint. No frank dislocation or fracture. No erosive change or bony destruction. No soft tissue ulceration or radiopaque foreign body evident by radiography. There is plantar fascia calcification  with a nearby inferior calcaneal spur. There are foci of arterial vascular calcification. 04/04/2017 -- the patient continues to be noncompliant and continues to complain of a lot of pain and has not done anything about quitting smoking Readmission: 06/26/18 on evaluation today patient presents for reevaluation she has not been seen in our office since May 2018. Since she was last seen here in our office she has undergone testing at Dr. Elspeth Cho office where it was revealed that she had an ABI of 0.68 with her previous ABI being 0.64 and a left ABI of 0.38 with previous ABI being 0.34 this study which was performed on 04/05/18 was pretty much equivalent to the study performed on 11/22/17. The findings in the end revealed on the right would appear to be moderate right lower extremity arterial disease on the left Dr. Gwenlyn Found states moderate in  the report but unfortunately this appears to be much more severe at 0.38 compared to the right. Subsequently the patient was scheduled to have angiography with Dr. Gwenlyn Found on 04/12/18. This was however canceled due to the fact that the patient was found to have chronic kidney disease stage III and it was to the point that he did not feel that it will be safe to pursue angiography at that point. She has not been on any antibiotics recently. At this point Dr. Gwenlyn Found has not rescheduled anything as far as the injured [place] according to the patient he is somewhat reluctant to do so. Nonetheless with her diminished blood flow this is gonna make it somewhat difficult for her to heal. 07/05/18; this is a patient I have not seen previously. She has very significant PAD as noted above. She apparently has had revascularization efforts by Dr. Gwenlyn Found on the right on 3 occasions to the patient. She was supposed to have an attempted angiography on the left however this was canceled apparently because of stage III chronic renal failure. I will need to research all of this. She  complains of significant pain in the wound and has claudication enough that when she walks to the end of the driveway she has to stop. She is been using silver alginate to the wounds on her legs and Iodoflex to the area on her left second toe. 07/13/18 on evaluation today patient's wounds actually appear to be doing about the same. She has an appointment she tells me within the next month that is September 2019 with Dr. Gwenlyn Found to discuss options to see if there's anything else that you can recommend or do for her. Nonetheless obviously what we're trying to do is do what we can to save her leg and in turn prevent any additional worsening or damage. None in the meantime we been mainly trying to manage her ulcers as best we can. 07/27/18; some improvement in the multiplicity of wounds on her left lower extremity and foot. She's been using silver alginate. I was unable to determine that she actually has an appointment with Dr. Gwenlyn Found. We are checking into this The patient's wound includes Left lateral foot, left plantar heel, her left anterior calf wound looks close to me, left fourth toe is very close to closed and the left medial calf is perhaps the largest open area READMISSION 02/19/2021 This is a now 60 year old woman with type 2 diabetes and continued cigarette smoking. She has known PAD. She was last in this clinic in 2019 at that point with wounds on her left foot. She left in a nonhealed state. On 06/28/2019 I see she had a left femoropopliteal by vein and vascular with an amputation of the fifth ray. These managed to heal over. Her last arterial studies were in February 2021. This showed an absent waveform at the posterior tibial artery on the right a dampened monophasic dorsalis pedis on the right of 0.44. On the left again the PTA TA was absent her dorsalis pedis was 0.99 triphasic and her great toe pressure was 0.65. This would have been after her revascularization. Once again she tells me that  Dr. Gwenlyn Found has done revascularization on the right leg on 3 different occasions. She has a right transmetatarsal amputation apparently done by Dr. Sharol Given remotely but I do not see a note for these right lower extremity revascularization but I may not of looked back far enough. In any case she says that the she has a wound on  the plantar aspect of the right transmetatarsal amputation site there is been there for several months. More recently she fell and had a wound on her right medial lower leg she had 5 sutures placed in the ER and then subsequently she has developed an area on the medial lower leg which was a blister that opened up. Past medical history includes type 2 diabetes, PAD, chronic renal failure, congestive heart failure, right transmetatarsal amputation, left fifth ray amputation, continued tobacco abuse, atrial fibrillation and cirrhosis. We did not attempt an ABI on the right leg today because of pain 02/26/2021; patient we admitted to the clinic last week. She has what looks to be 2 areas on her right medial and right anterior lower leg that look more like venous wounds but she has 1 on the first met head at the base of her right transmet. She has known severe PAD. She complains of a lot of pain although some of this may be neuropathic. Is difficult to exclude a component of claudication. Use silver alginate on the wounds on her legs and Iodoflex on the area on the first met head. She has an appointment with Dr. Gwenlyn Found on 5/25 although I will text him and discussed the situation. She is have poorly controlled diabetic. She has has stage IIIb chronic renal failure 4/22; patient presents for 1 week follow-up. The 2 areas on her right medial and right anterior lower leg appear well-healing. She has been using silver alginate to this area without issues. She has a first met head ulcer and it is unclear how long this has been there as it was discovered in the ED earlier this month when she was  being evaluated for another issue. Iodoflex has been used at this area. 4/29; patient presents for 1 week follow-up. She has been using silver alginate to the leg wounds and Iodoflex to the plantar foot wound. She has had this wrapped with Coban and Kerlix. She has no concerns or complaints today. Electronic Signature(s) Signed: 03/12/2021 2:54:46 PM By: [name] DO Entered By: [name] on 03/12/2021 14:51:58 -------------------------------------------------------------------------------- Physical Exam Details Patient Name: Date of Service: Karla Corwin, [name] 03/12/2021 1:00 PM Medical Record Number: [id number] Patient Account Number: [id number] Date of Birth/Sex: Treating RN: 1961-02-18 (60 y.o. [name] Primary Care Provider: SYSTEM, PRO Ave Filter Other Clinician: Referring Provider: Treating Provider/Extender: Yaakov Guthrie in Treatment: 3 Constitutional respirations regular, non-labored and within target range for patient.. Cardiovascular 2+ dorsalis pedis/posterior tibialis pulses. Psychiatric pleasant and cooperative. Notes Right anterior and medial lower leg with wounds limited to skin breakdown. There was sloughing throughout the wound bed. No signs of infection. Right plantar foot wound: Wound with granulation tissue present circumferentially with callus. No signs of infection. Electronic Signature(s) Signed: 03/12/2021 2:54:46 PM By: [name] DO Entered By: [name] on 03/12/2021 14:52:11 -------------------------------------------------------------------------------- Physician Orders Details Patient Name: Date of Service: Karla Corwin, [name] 03/12/2021 1:00 PM Medical Record Number: [id number] Patient Account Number: [id number] Date of Birth/Sex: Treating RN: 09/27/1961 (60 y.o. [name] Primary Care Provider: SYSTEM, PRO V IDER Other Clinician: Referring Provider: Treating Provider/Extender: Yaakov Guthrie in  Treatment: 3 Verbal / Phone Orders: No Diagnosis Coding ICD-10 Coding Code Description 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.518 Non-pressure chronic ulcer of other part of right foot with other specified severity E11.51 Type 2 diabetes mellitus with diabetic peripheral angiopathy without gangrene L97.811 Non-pressure chronic ulcer of other part of right lower  leg limited to breakdown of skin Follow-up Appointments ppointment in 1 week. - with Dr. Dellia Nims Return A Bathing/ Shower/ Hygiene May shower with protection but do not get wound dressing(s) wet. Edema Control - Lymphedema / SCD / Other Elevate legs to the level of the heart or above for 30 minutes daily and/or when sitting, a frequency of: Avoid standing for long periods of time. Exercise regularly Off-Loading Open toe surgical shoe to: - with felt padding to offload Additional Orders / Instructions Stop/Decrease Smoking Follow Nutritious Diet Wound Treatment Wound #16 - Amputation Site - Transmetatarsal Wound Laterality: Plantar, Right Cleanser: Soap and Water 1 x Per Week/15 Days Discharge Instructions: May shower and wash wound with dial antibacterial soap and water prior to dressing change. Prim Dressing: IODOFLEX 0.9% Cadexomer Iodine Pad 4x6 cm 1 x Per Week/15 Days ary Discharge Instructions: Apply to wound bed as instructed Secondary Dressing: Woven Gauze Sponge, Non-Sterile 4x4 in 1 x Per Week/15 Days Discharge Instructions: Apply over primary dressing as directed. Compression Wrap: Kerlix Roll 4.5x3.1 (in/yd) 1 x Per Week/15 Days Discharge Instructions: Apply Kerlix and Coban compression as directed. Compression Wrap: Coban Self-Adherent Wrap 4x5 (in/yd) 1 x Per Week/15 Days Discharge Instructions: Apply over Kerlix as directed. Wound #17 - Lower Leg Wound Laterality: Right, Medial Cleanser: Soap and Water 1 x Per Week Discharge Instructions: May shower and wash wound with dial antibacterial soap  and water prior to dressing change. Prim Dressing: KerraCel Ag Gelling Fiber Dressing, 4x5 in (silver alginate) 1 x Per Week ary Discharge Instructions: Apply silver alginate to wound bed as instructed Secondary Dressing: Woven Gauze Sponge, Non-Sterile 4x4 in 1 x Per Week Discharge Instructions: Apply over primary dressing as directed. Secondary Dressing: ABD Pad, 8x10 1 x Per Week Discharge Instructions: Apply over primary dressing as directed. Compression Wrap: Kerlix Roll 4.5x3.1 (in/yd) 1 x Per Week Discharge Instructions: Apply Kerlix and Coban compression as directed. Compression Wrap: Coban Self-Adherent Wrap 4x5 (in/yd) 1 x Per Week Discharge Instructions: Apply over Kerlix as directed. Wound #18 - Lower Leg Wound Laterality: Right, Anterior Cleanser: Soap and Water 1 x Per Week Discharge Instructions: May shower and wash wound with dial antibacterial soap and water prior to dressing change. Prim Dressing: KerraCel Ag Gelling Fiber Dressing, 4x5 in (silver alginate) 1 x Per Week ary Discharge Instructions: Apply silver alginate to wound bed as instructed Secondary Dressing: Woven Gauze Sponge, Non-Sterile 4x4 in 1 x Per Week Discharge Instructions: Apply over primary dressing as directed. Secondary Dressing: ABD Pad, 8x10 1 x Per Week Discharge Instructions: Apply over primary dressing as directed. Compression Wrap: Kerlix Roll 4.5x3.1 (in/yd) 1 x Per Week Discharge Instructions: Apply Kerlix and Coban compression as directed. Compression Wrap: Coban Self-Adherent Wrap 4x5 (in/yd) 1 x Per Week Discharge Instructions: Apply over Kerlix as directed. Electronic Signature(s) Signed: 03/12/2021 2:54:46 PM By: [name] DO Previous Signature: 03/12/2021 1:35:33 PM Version By: Lorrin Jackson Entered By: [name] on 03/12/2021 14:52:24 -------------------------------------------------------------------------------- Problem List Details Patient Name: Date of Service: Karla Corwin, [name] 03/12/2021 1:00 PM Medical Record Number: [id number] Patient Account Number: [id number] Date of Birth/Sex: Treating RN: [birth year] (60 y.o. [name] Primary Care Provider: SYSTEM, PRO Ave Filter Other Clinician: Referring Provider: Treating Provider/Extender: Yaakov Guthrie in Treatment: 3 Active Problems ICD-10 Encounter Code Description Active Date MDM Diagnosis 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 2 diabetes mellitus with foot ulcer 02/19/2021 No Yes L97.518 Non-pressure chronic ulcer of other part of right foot with other specified 02/19/2021 No Yes  severity E11.51 Type 2 diabetes mellitus with diabetic peripheral angiopathy without gangrene 02/19/2021 No Yes L97.811 Non-pressure chronic ulcer of other part of right lower leg limited to breakdown 02/19/2021 No Yes of skin Inactive Problems Resolved Problems Electronic Signature(s) Signed: 03/12/2021 2:54:46 PM By: [name] DO Previous Signature: 03/12/2021 1:35:08 PM Version By: Lorrin Jackson Entered By: [name] on 03/12/2021 14:49:53 -------------------------------------------------------------------------------- Progress Note Details Patient Name: Date of Service: Karla Corwin, [name] 03/12/2021 1:00 PM Medical Record Number: [id number] Patient Account Number: [id number] Date of Birth/Sex: Treating RN: August 07, 1961 (60 y.o. [name] Primary Care Provider: SYSTEM, PRO V IDER Other Clinician: Referring Provider: Treating Provider/Extender: Yaakov Guthrie in Treatment: 3 Subjective Chief Complaint Information obtained from Patient Bilateral LE Ulcers History of Present Illness (HPI) This 60 year old patient who has a very long significant history of diabetes mellitus, previous alcohol and nicotine abuse, chronic data disease, COPD, diabetes mellitus, height hypertension, critical lower limb ischemia with several wounds being managed at the wound Center at [facility] for over a year. She was recently  in the ER at [facility] and was referred to our center. He has had a long history of critical limb ischemia and over a period of time has had balloon angioplasties in March 2016, endarterectomy of the left carotid, lower extremity angiogram and treatment by Dr. Quay Burow, several cardiac catheterizations. Most recently she had an x-ray of her left foot while in the ER which showed no acute abnormality. during this ER visit she was started on ciprofloxacin and asked to continue with the wound care physicians at [facility]. Her last ABI done in June 2017 showed the right side to be 0.28 in the left side to be 0.48. Her right toe brachial index was 0.17 on the right and 0.27 on the left. her last hemoglobin A1c was 11.1% She continues to smoke about a pack of cigarettes a day. 03/28/2017 -- -- right foot x-ray -- IMPRESSION:Areas of soft tissue swelling. Mild subluxation second PIP joint. No frank dislocation or fracture. No erosive change or bony destruction. No soft tissue ulceration or radiopaque foreign body evident by radiography. There is plantar fascia calcification with a nearby inferior calcaneal spur. There are foci of arterial vascular calcification. 04/04/2017 -- the patient continues to be noncompliant and continues to complain of a lot of pain and has not done anything about quitting smoking Readmission: 06/26/18 on evaluation today patient presents for reevaluation she has not been seen in our office since May 2018. Since she was last seen here in our office she has undergone testing at Dr. Fransico Michael office where it was revealed that she had an ABI of 0.68 with her previous ABI being 0.64 and a left ABI of 0.38 with previous ABI being 0.34 this study which was performed on 04/05/18 was pretty much equivalent to the study performed on 11/22/17. The findings in the end revealed on the right would appear to be moderate right lower extremity arterial disease on the left  Dr. Gwenlyn Found states moderate in the report but unfortunately this appears to be much more severe at 0.38 compared to the right. Subsequently the patient was scheduled to have angiography with Dr. Gwenlyn Found on 04/12/18. This was however canceled due to the fact that the patient was found to have chronic kidney disease stage III and it was to the point that he did not feel that it will be safe to pursue angiography at that point. She has not been on  any antibiotics recently. At this point Dr. Gwenlyn Found has not rescheduled anything as far as the injured [place] according to the patient he is somewhat reluctant to do so. Nonetheless with her diminished blood flow this is gonna make it somewhat difficult for her to heal. 07/05/18; this is a patient I have not seen previously. She has very significant PAD as noted above. She apparently has had revascularization efforts by Dr. Gwenlyn Found on the right on 3 occasions to the patient. She was supposed to have an attempted angiography on the left however this was canceled apparently because of stage III chronic renal failure. I will need to research all of this. She complains of significant pain in the wound and has claudication enough that when she walks to the end of the driveway she has to stop. She is been using silver alginate to the wounds on her legs and Iodoflex to the area on her left second toe. 07/13/18 on evaluation today patient's wounds actually appear to be doing about the same. She has an appointment she tells me within the next month that is September 2019 with Dr. Gwenlyn Found to discuss options to see if there's anything else that you can recommend or do for her. Nonetheless obviously what we're trying to do is do what we can to save her leg and in turn prevent any additional worsening or damage. None in the meantime we been mainly trying to manage her ulcers as best we can. 07/27/18; some improvement in the multiplicity of wounds on her left lower extremity and foot.  She's been using silver alginate. I was unable to determine that she actually has an appointment with Dr. Gwenlyn Found. We are checking into this The patient's wound includes Left lateral foot, left plantar heel, her left anterior calf wound looks close to me, left fourth toe is very close to closed and the left medial calf is perhaps the largest open area READMISSION 02/19/2021 This is a now 60 year old woman with type 2 diabetes and continued cigarette smoking. She has known PAD. She was last in this clinic in 2019 at that point with wounds on her left foot. She left in a nonhealed state. On 06/28/2019 I see she had a left femoropopliteal by vein and vascular with an amputation of the fifth ray. These managed to heal over. Her last arterial studies were in February 2021. This showed an absent waveform at the posterior tibial artery on the right a dampened monophasic dorsalis pedis on the right of 0.44. On the left again the PTA TA was absent her dorsalis pedis was 0.99 triphasic and her great toe pressure was 0.65. This would have been after her revascularization. Once again she tells me that Dr. Gwenlyn Found has done revascularization on the right leg on 3 different occasions. She has a right transmetatarsal amputation apparently done by Dr. Sharol Given remotely but I do not see a note for these right lower extremity revascularization but I may not of looked back far enough. In any case she says that the she has a wound on the plantar aspect of the right transmetatarsal amputation site there is been there for several months. More recently she fell and had a wound on her right medial lower leg she had 5 sutures placed in the ER and then subsequently she has developed an area on the medial lower leg which was a blister that opened up. Past medical history includes type 2 diabetes, PAD, chronic renal failure, congestive heart failure, right transmetatarsal amputation, left fifth ray  amputation, continued tobacco abuse,  atrial fibrillation and cirrhosis. We did not attempt an ABI on the right leg today because of pain 02/26/2021; patient we admitted to the clinic last week. She has what looks to be 2 areas on her right medial and right anterior lower leg that look more like venous wounds but she has 1 on the first met head at the base of her right transmet. She has known severe PAD. She complains of a lot of pain although some of this may be neuropathic. Is difficult to exclude a component of claudication. Use silver alginate on the wounds on her legs and Iodoflex on the area on the first met head. She has an appointment with Dr. Gwenlyn Found on 5/25 although I will text him and discussed the situation. She is have poorly controlled diabetic. She has has stage IIIb chronic renal failure 4/22; patient presents for 1 week follow-up. The 2 areas on her right medial and right anterior lower leg appear well-healing. She has been using silver alginate to this area without issues. She has a first met head ulcer and it is unclear how long this has been there as it was discovered in the ED earlier this month when she was being evaluated for another issue. Iodoflex has been used at this area. 4/29; patient presents for 1 week follow-up. She has been using silver alginate to the leg wounds and Iodoflex to the plantar foot wound. She has had this wrapped with Coban and Kerlix. She has no concerns or complaints today. Patient History Information obtained from Patient. Family History Cancer - Mother, Diabetes - Mother, Heart Disease - Mother,Father, Hypertension - Mother,Father, Lung Disease - Siblings, Stroke - Mother, Thyroid Problems - Mother, No family history of Hereditary Spherocytosis, Kidney Disease, Seizures, Tuberculosis. Social History Former smoker, Marital Status - Divorced, Alcohol Use - Never, Drug Use - Current History - marijuana, Caffeine Use - Daily - Coffee. Medical History Eyes Denies history of Cataracts,  Glaucoma, Optic Neuritis Ear/Nose/Mouth/Throat Denies history of Chronic sinus problems/congestion, Middle ear problems Hematologic/Lymphatic Denies history of Anemia, Hemophilia, Human Immunodeficiency Virus, Lymphedema, Sickle Cell Disease Respiratory Patient has history of Chronic Obstructive Pulmonary Disease (COPD), Sleep Apnea Denies history of Aspiration, Asthma, Pneumothorax, Tuberculosis Cardiovascular Patient has history of Arrhythmia - NSVT , PAF , PAT Congestive Heart Failure - chronic combined, Coronary Artery Disease, Hypertension, Peripheral , Arterial Disease Denies history of Angina, Deep Vein Thrombosis, Hypotension, Myocardial Infarction, Peripheral Venous Disease, Phlebitis, Vasculitis Gastrointestinal Patient has history of Cirrhosis - alcoholic Denies history of Colitis, Crohnoos, Hepatitis A, Hepatitis B, Hepatitis C Endocrine Patient has history of Type II Diabetes Denies history of Type I Diabetes Genitourinary Denies history of End Stage Renal Disease Immunological Denies history of Lupus Erythematosus, Raynaudoos, Scleroderma Integumentary (Skin) Denies history of History of Burn Musculoskeletal Patient has history of Osteoarthritis Denies history of Gout, Rheumatoid Arthritis, Osteomyelitis Neurologic Patient has history of Neuropathy - dm2 peripheral Denies history of Dementia, Quadriplegia, Paraplegia, Seizure Disorder Oncologic Denies history of Received Chemotherapy, Received Radiation Psychiatric Denies history of Anorexia/bulimia, Confinement Anxiety Hospitalization/Surgery History - balloon angioplasty. - cardiac cath X2. - cardioversionX2. - carotid angiogram. - DandC uterus. - endarcdectomy (L). - (L) heart cath. - lower ext angiogram X2. - peripheral athrectomy. Medical A Surgical History Notes nd Constitutional Symptoms (General Health) alcohol abuse , cocaine abuse , marijuana abuse , narcotic abuse , noncompliance , obesity , tobacco  abuse , insomnia , Hematologic/Lymphatic B12 deficiency Respiratory wheezing , Cardiovascular cardiomyopathy , critical  lower limb ischemia , elevated troponin , hyperlipidemia , hypokalemia , hypomagnesiumia , symptomatic bradycardia , carotid arterial disease ,demand ischemia , Gastrointestinal GERD Endocrine hypothyroidism Genitourinary renal insufficiency , CKD Stage 3 , Integumentary (Skin) cellulitis Psychiatric anxiety Objective Constitutional respirations regular, non-labored and within target range for patient.. Vitals Time Taken: 1:09 PM, Temperature: 98.5 F, Pulse: 81 bpm, Respiratory Rate: 20 breaths/min, Blood Pressure: 100/61 mmHg, Capillary Blood Glucose: 285 mg/dl. General Notes: Per patient orthopedist performed a steroid injection to left hand/wrist this week. Cardiovascular 2+ dorsalis pedis/posterior tibialis pulses. Psychiatric pleasant and cooperative. General Notes: Right anterior and medial lower leg with wounds limited to skin breakdown. There was sloughing throughout the wound bed. No signs of infection. Right plantar foot wound: Wound with granulation tissue present circumferentially with callus. No signs of infection. Integumentary (Hair, Skin) Wound #16 status is Open. Original cause of wound was Gradually Appeared. The date acquired was: 02/05/2021. The wound has been in treatment 3 weeks. The wound is located on the Right,Plantar Amputation Site - Transmetatarsal. The wound measures 1.1cm length x 1.2cm width x 0.4cm depth; 1.037cm^2 area and 0.415cm^3 volume. There is Fat Layer (Subcutaneous Tissue) exposed. There is no undermining noted, however, there is tunneling at 9:00 with a maximum distance of 0.5cm. There is a medium amount of serosanguineous drainage noted. The wound margin is distinct with the outline attached to the wound base. There is medium (34-66%) red, pink granulation within the wound bed. There is a medium (34-66%) amount of  necrotic tissue within the wound bed including Adherent Slough. Wound #17 status is Open. Original cause of wound was Blister. The date acquired was: 02/05/2021. The wound has been in treatment 3 weeks. The wound is located on the Right,Medial Lower Leg. The wound measures 0.5cm length x 0.5cm width x 0.1cm depth; 0.196cm^2 area and 0.02cm^3 volume. There is Fat Layer (Subcutaneous Tissue) exposed. There is no tunneling or undermining noted. There is a medium amount of serosanguineous drainage noted. The wound margin is distinct with the outline attached to the wound base. There is large (67-100%) red, pink granulation within the wound bed. There is no necrotic tissue within the wound bed. Wound #18 status is Open. Original cause of wound was Trauma. The date acquired was: 02/05/2021. The wound has been in treatment 3 weeks. The wound is located on the Right,Anterior Lower Leg. The wound measures 0.2cm length x 0.2cm width x 0.1cm depth; 0.031cm^2 area and 0.003cm^3 volume. There is Fat Layer (Subcutaneous Tissue) exposed. There is no tunneling or undermining noted. There is a medium amount of serosanguineous drainage noted. The wound margin is distinct with the outline attached to the wound base. There is large (67-100%) red, pink granulation within the wound bed. There is no necrotic tissue within the wound bed. Assessment Active Problems ICD-10 Type 2 diabetes mellitus with foot ulcer Non-pressure chronic ulcer of other part of right foot with other specified severity Type 2 diabetes mellitus with diabetic peripheral angiopathy without gangrene Non-pressure chronic ulcer of other part of right lower leg limited to breakdown of skin Patient has been doing well overall. Her right lower extremity wounds appear well-healing and I suspect these will be closed by next clinic visit. Plantar foot wound is stable. There was callus and fibrotic debris and this was debrided. We will continue with Iodoflex  to this area. We will wrap with Kerlix and Coban. She can follow-up in 1 week Procedures Wound #16 Pre-procedure diagnosis of Wound #16 is a Diabetic Wound/Ulcer  of the Lower Extremity located on the Right,Plantar Amputation Site - Transmetatarsal .Severity of Tissue Pre Debridement is: Fat layer exposed. There was a Excisional Skin/Subcutaneous Tissue Debridement with a total area of 1.32 sq cm performed by [name], DO. With the following instrument(s): Curette to remove Non-Viable tissue/material. Material removed includes Callus and Subcutaneous Tissue and after achieving pain control using Lidocaine 4% T opical Solution. No specimens were taken. A time out was conducted at 14:24, prior to the start of the procedure. A Minimum amount of bleeding was controlled with Pressure. The procedure was tolerated well. Post Debridement Measurements: 1.1cm length x 1.2cm width x 0.4cm depth; 0.415cm^3 volume. Character of Wound/Ulcer Post Debridement is stable. Severity of Tissue Post Debridement is: Fat layer exposed. Post procedure Diagnosis Wound #16: Same as Pre-Procedure Wound #17 Pre-procedure diagnosis of Wound #17 is a Diabetic Wound/Ulcer of the Lower Extremity located on the Right,Medial Lower Leg .Severity of Tissue Pre Debridement is: Fat layer exposed. There was a Excisional Skin/Subcutaneous Tissue Debridement with a total area of 0.25 sq cm performed by [name], DO. With the following instrument(s): Curette to remove Non-Viable tissue/material. Material removed includes Subcutaneous Tissue after achieving pain control using Lidocaine 4% Topical Solution. No specimens were taken. A time out was conducted at 14:24, prior to the start of the procedure. A Minimum amount of bleeding was controlled with Pressure. The procedure was tolerated well. Post Debridement Measurements: 0.5cm length x 0.5cm width x 0.1cm depth; 0.02cm^3 volume. Character of Wound/Ulcer Post Debridement is  stable. Severity of Tissue Post Debridement is: Fat layer exposed. Post procedure Diagnosis Wound #17: Same as Pre-Procedure Wound #18 Pre-procedure diagnosis of Wound #18 is a Diabetic Wound/Ulcer of the Lower Extremity located on the Right,Anterior Lower Leg .Severity of Tissue Pre Debridement is: Fat layer exposed. There was a Excisional Skin/Subcutaneous Tissue Debridement with a total area of 0.04 sq cm performed by [name], DO. With the following instrument(s): Curette to remove Non-Viable tissue/material. Material removed includes Subcutaneous Tissue after achieving pain control using Lidocaine 4% Topical Solution. No specimens were taken. A time out was conducted at 14:24, prior to the start of the procedure. A Minimum amount of bleeding was controlled with Pressure. The procedure was tolerated well. Post Debridement Measurements: 0.2cm length x 0.2cm width x 0.1cm depth; 0.003cm^3 volume. Character of Wound/Ulcer Post Debridement is stable. Severity of Tissue Post Debridement is: Fat layer exposed. Post procedure Diagnosis Wound #18: Same as Pre-Procedure Plan Follow-up Appointments: Return Appointment in 1 week. - with Dr. Arcola Jansky Shower/ Hygiene: May shower with protection but do not get wound dressing(s) wet. Edema Control - Lymphedema / SCD / Other: Elevate legs to the level of the heart or above for 30 minutes daily and/or when sitting, a frequency of: Avoid standing for long periods of time. Exercise regularly Off-Loading: Open toe surgical shoe to: - with felt padding to offload Additional Orders / Instructions: Stop/Decrease Smoking Follow Nutritious Diet WOUND #16: - Amputation Site - Transmetatarsal Wound Laterality: Plantar, Right Cleanser: Soap and Water 1 x Per Week/15 Days Discharge Instructions: May shower and wash wound with dial antibacterial soap and water prior to dressing change. Prim Dressing: IODOFLEX 0.9% Cadexomer Iodine Pad 4x6 cm 1 x Per  Week/15 Days ary Discharge Instructions: Apply to wound bed as instructed Secondary Dressing: Woven Gauze Sponge, Non-Sterile 4x4 in 1 x Per Week/15 Days Discharge Instructions: Apply over primary dressing as directed. Com pression Wrap: Kerlix Roll 4.5x3.1 (in/yd) 1 x Per Week/15 Days  Discharge Instructions: Apply Kerlix and Coban compression as directed. Com pression Wrap: Coban Self-Adherent Wrap 4x5 (in/yd) 1 x Per Week/15 Days Discharge Instructions: Apply over Kerlix as directed. WOUND #17: - Lower Leg Wound Laterality: Right, Medial Cleanser: Soap and Water 1 x Per Week/ Discharge Instructions: May shower and wash wound with dial antibacterial soap and water prior to dressing change. Prim Dressing: KerraCel Ag Gelling Fiber Dressing, 4x5 in (silver alginate) 1 x Per Week/ ary Discharge Instructions: Apply silver alginate to wound bed as instructed Secondary Dressing: Woven Gauze Sponge, Non-Sterile 4x4 in 1 x Per Week/ Discharge Instructions: Apply over primary dressing as directed. Secondary Dressing: ABD Pad, 8x10 1 x Per Week/ Discharge Instructions: Apply over primary dressing as directed. Com pression Wrap: Kerlix Roll 4.5x3.1 (in/yd) 1 x Per Week/ Discharge Instructions: Apply Kerlix and Coban compression as directed. Com pression Wrap: Coban Self-Adherent Wrap 4x5 (in/yd) 1 x Per Week/ Discharge Instructions: Apply over Kerlix as directed. WOUND #18: - Lower Leg Wound Laterality: Right, Anterior Cleanser: Soap and Water 1 x Per Week/ Discharge Instructions: May shower and wash wound with dial antibacterial soap and water prior to dressing change. Prim Dressing: KerraCel Ag Gelling Fiber Dressing, 4x5 in (silver alginate) 1 x Per Week/ ary Discharge Instructions: Apply silver alginate to wound bed as instructed Secondary Dressing: Woven Gauze Sponge, Non-Sterile 4x4 in 1 x Per Week/ Discharge Instructions: Apply over primary dressing as directed. Secondary Dressing: ABD  Pad, 8x10 1 x Per Week/ Discharge Instructions: Apply over primary dressing as directed. Compression Wrap: Kerlix Roll 4.5x3.1 (in/yd) 1 x Per Week/ Discharge Instructions: Apply Kerlix and Coban compression as directed. Compression Wrap: Coban Self-Adherent Wrap 4x5 (in/yd) 1 x Per Week/ Discharge Instructions: Apply over Kerlix as directed. 1. Silver alginate to the anterior leg wounds. Iodoflex to the plantar foot wound. Kerlix and Coban 2. Aggressive offloading 3. Follow-up next week Electronic Signature(s) Signed: 03/12/2021 2:54:46 PM By: [name] DO Entered By: [name] on 03/12/2021 14:53:50 -------------------------------------------------------------------------------- HxROS Details Patient Name: Date of Service: Karla Corwin, [name] 03/12/2021 1:00 PM Medical Record Number: [id number] Patient Account Number: [id number] Date of Birth/Sex: Treating RN: [birth year] (60 y.o. [name] Primary Care Provider: SYSTEM, [facility] [address] [employer] Other Clinician: Referring Provider: Treating Provider/Extender: Yaakov Guthrie in Treatment: 3 Information Obtained From Patient Constitutional Symptoms (General Health) Medical History: Past Medical History Notes: alcohol abuse , cocaine abuse , marijuana abuse , narcotic abuse , noncompliance , obesity , tobacco abuse , insomnia , Eyes Medical History: Negative for: Cataracts; Glaucoma; Optic Neuritis Ear/Nose/Mouth/Throat Medical History: Negative for: Chronic sinus problems/congestion; Middle ear problems Hematologic/Lymphatic Medical History: Negative for: Anemia; Hemophilia; Human Immunodeficiency Virus; Lymphedema; Sickle Cell Disease Past Medical History Notes: B12 deficiency Respiratory Medical History: Positive for: Chronic Obstructive Pulmonary Disease (COPD); Sleep Apnea Negative for: Aspiration; Asthma; Pneumothorax; Tuberculosis Past Medical History Notes: wheezing , Cardiovascular Medical  History: Positive for: Arrhythmia - NSVT , PAF , PAT Congestive Heart Failure - chronic combined; Coronary Artery Disease; Hypertension; Peripheral Arterial ; Disease Negative for: Angina; Deep Vein Thrombosis; Hypotension; Myocardial Infarction; Peripheral Venous Disease; Phlebitis; Vasculitis Past Medical History Notes: cardiomyopathy , critical lower limb ischemia , elevated troponin , hyperlipidemia , hypokalemia , hypomagnesiumia , symptomatic bradycardia , carotid arterial disease ,demand ischemia , Gastrointestinal Medical History: Positive for: Cirrhosis - alcoholic Negative for: Colitis; Crohns; Hepatitis A; Hepatitis B; Hepatitis C Past Medical History Notes: GERD Endocrine Medical History: Positive for: Type II Diabetes Negative for: Type I Diabetes  Past Medical History Notes: hypothyroidism Time with diabetes: 5 years Treated with: Insulin Blood sugar tested every day: Yes Tested : 3x a day Genitourinary Medical History: Negative for: End Stage Renal Disease Past Medical History Notes: renal insufficiency , CKD Stage 3 , Immunological Medical History: Negative for: Lupus Erythematosus; Raynauds; Scleroderma Integumentary (Skin) Medical History: Negative for: History of Burn Past Medical History Notes: cellulitis Musculoskeletal Medical History: Positive for: Osteoarthritis Negative for: Gout; Rheumatoid Arthritis; Osteomyelitis Neurologic Medical History: Positive for: Neuropathy - dm2 peripheral Negative for: Dementia; Quadriplegia; Paraplegia; Seizure Disorder Oncologic Medical History: Negative for: Received Chemotherapy; Received Radiation Psychiatric Medical History: Negative for: Anorexia/bulimia; Confinement Anxiety Past Medical History Notes: anxiety Immunizations Pneumococcal Vaccine: Received Pneumococcal Vaccination: Yes Tetanus Vaccine: Last tetanus shot: 10/14/1981 Implantable Devices None Hospitalization / Surgery History Type of  Hospitalization/Surgery balloon angioplasty cardiac cath X2 cardioversionX2 carotid angiogram DandC uterus endarcdectomy (L) (L) heart cath lower ext angiogram X2 peripheral athrectomy Family and Social History Cancer: Yes - Mother; Diabetes: Yes - Mother; Heart Disease: Yes - Mother,Father; Hereditary Spherocytosis: No; Hypertension: Yes - Mother,Father; Kidney Disease: No; Lung Disease: Yes - Siblings; Seizures: No; Stroke: Yes - Mother; Thyroid Problems: Yes - Mother; Tuberculosis: No; Former smoker; Marital Status - Divorced; Alcohol Use: Never; Drug Use: Current History - marijuana; Caffeine Use: Daily - Coffee; Financial Concerns: No; Food, Clothing or Shelter Needs: No; Support System Lacking: No; Transportation Concerns: No Electronic Signature(s) Signed: 03/12/2021 2:54:46 PM By: [name] DO Signed: 03/12/2021 5:13:52 PM By: Lorrin Jackson Entered By: [name] on 03/12/2021 14:52:05 -------------------------------------------------------------------------------- SuperBill Details Patient Name: Date of Service: Karla Corwin, [name] 03/12/2021 Medical Record Number: [id number] Patient Account Number: [id number] Date of Birth/Sex: Treating RN: [birth year] (60 y.o. [name] Primary Care Provider: SYSTEM, [facility] [address] [employer] Other Clinician: Referring Provider: Treating Provider/Extender: Yaakov Guthrie in Treatment: 3 Diagnosis Coding ICD-10 Codes Code Description 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.518 Non-pressure chronic ulcer of other part of right foot with other specified severity E11.51 Type 2 diabetes mellitus with diabetic peripheral angiopathy without gangrene L97.811 Non-pressure chronic ulcer of other part of right lower leg limited to breakdown of skin Facility Procedures CPT4 Code: [id number] 1 Description: 2202 - DEB SUBQ TISSUE 20 SQ CM/< ICD-10 Diagnosis Description L97.518 Non-pressure chronic ulcer of other part of right  foot with other specified sev 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 2 diabetes mellitus with foot ulcer Modifier: erity Quantity: 1 Physician Procedures : CPT4 Code Description Modifier [id number] - WC PHYS SUBQ TISS 20 SQ CM ICD-10 Diagnosis Description L97.518 Non-pressure chronic ulcer of other part of right foot with other specified severity 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 2 diabetes mellitus with foot ulcer Quantity: 1 Electronic Signature(s) Signed: 03/12/2021 2:54:46 PM By: [name] DO Entered By: [name] on 03/12/2021 14:54:23

## 2021-03-12 NOTE — Progress Notes (Signed)
**Note Karla-Identified via Obfuscation** [name], [name] ([id number]) Visit Report for 03/12/2021 Arrival Information Details Patient Name: Date of Service: [name] 03/12/2021 1:00 PM Medical Record Number: [id number] Patient Account Number: [id number] Date of Birth/Sex: Treating RN: April 24, 1961 (60 y.o. [name], Meta.Reding Primary Care Travis Mastel: SYSTEM, PRO V IDER Other Clinician: Referring Jacole Capley: Treating Hester Joslin/Extender: Yaakov Guthrie in Treatment: 3 Visit Information History Since Last Visit Added or deleted any medications: No Patient Arrived: Ambulatory Any new allergies or adverse reactions: No Arrival Time: 13:08 Had a fall or experienced change in No Accompanied By: self activities of daily living that may affect Transfer Assistance: None risk of falls: Patient Identification Verified: Yes Signs or symptoms of abuse/neglect since last visito No Secondary Verification Process Completed: Yes Hospitalized since last visit: No Patient Requires Transmission-Based Precautions: No Implantable device outside of the clinic excluding No Patient Has Alerts: No cellular tissue based products placed in the center since last visit: Has Dressing in Place as Prescribed: Yes Has Compression in Place as Prescribed: Yes Pain Present Now: Yes Electronic Signature(s) Signed: 03/12/2021 5:30:47 PM By: Deon Pilling Entered By: Deon Pilling on 03/12/2021 13:21:50 -------------------------------------------------------------------------------- Encounter Discharge Information Details Patient Name: Date of Service: Karla Corwin, [name] 03/12/2021 1:00 PM Medical Record Number: [id number] Patient Account Number: [id number] Date of Birth/Sex: Treating RN: 1960-11-18 (60 y.o. [name] Primary Care Briaunna Grindstaff: SYSTEM, PRO Ave Filter Other Clinician: Referring Salome Hautala: Treating Flor Whitacre/Extender: Yaakov Guthrie in Treatment: 3 Encounter Discharge Information Items Post Procedure Vitals Discharge Condition:  Stable Temperature (F): 98.5 Ambulatory Status: Ambulatory Pulse (bpm): 81 Discharge Destination: Home Respiratory Rate (breaths/min): 18 Transportation: Private Auto Blood Pressure (mmHg): 100/61 Accompanied By: self Schedule Follow-up Appointment: Yes Clinical Summary of Care: Patient Declined Electronic Signature(s) Signed: 03/12/2021 5:21:08 PM By: Baruch Gouty RN, BSN Entered By: Baruch Gouty on 03/12/2021 15:02:40 -------------------------------------------------------------------------------- Lower Extremity Assessment Details Patient Name: Date of Service: Karla Corwin, [name] 03/12/2021 1:00 PM Medical Record Number: [id number] Patient Account Number: [id number] Date of Birth/Sex: Treating RN: [birth year] (60 y.o. [name] Primary Care Delainee Tramel: SYSTEM, PRO V IDER Other Clinician: Referring Antwane Grose: Treating Micca Matura/Extender: Yaakov Guthrie in Treatment: 3 Edema Assessment Assessed: [Left: No] [Right: Yes] Edema: [Left: Ye] [Right: s] Calf Left: Right: Point of Measurement: From Medial Instep 39 cm Ankle Left: Right: Point of Measurement: From Medial Instep 21 cm Vascular Assessment Pulses: Dorsalis Pedis Palpable: [Right:Yes] Electronic Signature(s) Signed: 03/12/2021 5:30:47 PM By: Deon Pilling Entered By: Deon Pilling on 03/12/2021 13:22:59 -------------------------------------------------------------------------------- Multi Wound Chart Details Patient Name: Date of Service: Karla Corwin, [name] 03/12/2021 1:00 PM Medical Record Number: [id number] Patient Account Number: [id number] Date of Birth/Sex: Treating RN: [birth year] (60 y.o. [name] Primary Care Makailey Hodgkin: SYSTEM, PRO V IDER Other Clinician: Referring Leotis Isham: Treating Jenin Birdsall/Extender: Yaakov Guthrie in Treatment: 3 Vital Signs Height(in): Capillary Blood Glucose(mg/dl): 285 Weight(lbs): Pulse(bpm): 5 Body Mass Index(BMI): Blood Pressure(mmHg):  100/61 Temperature(F): 98.5 Respiratory Rate(breaths/min): 20 Photos: [16:No Photos Right, Plantar Amputation Site -] [17:No Photos Right, Medial Lower Leg] [18:No Photos Right, Anterior Lower Leg] Wound Location: [16:Transmetatarsal Gradually Appeared] [17:Blister] [18:Trauma] Wounding Event: [16:Diabetic Wound/Ulcer of the Lower] [17:Diabetic Wound/Ulcer of the Lower] [18:Diabetic Wound/Ulcer of the Lower] Primary Etiology: [16:Extremity Chronic Obstructive Pulmonary] [17:Extremity Chronic Obstructive Pulmonary] [18:Extremity Chronic Obstructive Pulmonary] Comorbid History: [16:Disease (COPD), Sleep Apnea, Arrhythmia, Congestive Heart Failure, Coronary Artery Disease, Hypertension, Peripheral Arterial Disease, Cirrhosis , Type II Diabetes, Osteoarthritis, Neuropathy 02/05/2021] [17:Disease (COPD), Sleep  Apnea, Arrhythmia, Congestive Heart Failure,  Coronary Artery Disease, Hypertension, Peripheral Arterial Disease, Cirrhosis , Type II Diabetes, Osteoarthritis, Neuropathy 02/05/2021] [18:Disease (COPD), Sleep Apnea, Arrhythmia, Congestive Heart Failure,  Coronary Artery Disease, Hypertension, Peripheral Arterial Disease, Cirrhosis , Type II Diabetes, Osteoarthritis, Neuropathy 02/05/2021] Date Acquired: [16:3] [17:3] [18:3] Weeks of Treatment: [16:Open] [17:Open] [18:Open] Wound Status: [16:1.1x1.2x0.4] [17:0.5x0.5x0.1] [18:0.2x0.2x0.1] Measurements L x W x D (cm) [16:1.037] [17:0.196] [18:0.031] A (cm) : rea [16:0.415] [17:0.02] [id number] Volume (cm) : [16:36.50%] [17:99.30%] [18:99.90%] % Reduction in A rea: [16:15.30%] [17:99.30%] [18:99.90%] % Reduction in Volume: [16:9] Position 1 (o'clock): [16:0.5] Maximum Distance 1 (cm): [16:Yes] [17:No] [18:No] Tunneling: [16:Grade 2] [17:Grade 2] [18:Grade 2] Classification: [16:Medium] [17:Medium] [18:Medium] Exudate A mount: [16:Serosanguineous] [17:Serosanguineous] [18:Serosanguineous] Exudate Type: [16:red, brown] [17:red, brown] [18:red,  brown] Exudate Color: [16:Distinct, outline attached] [17:Distinct, outline attached] [18:Distinct, outline attached] Wound Margin: [16:Medium (34-66%)] [17:Large (67-100%)] [18:Large (67-100%)] Granulation A mount: [16:Red, Pink] [17:Red, Pink] [18:Red, Pink] Granulation Quality: [16:Medium (34-66%)] [17:None Present (0%)] [18:None Present (0%)] Necrotic A mount: [16:Fat Layer (Subcutaneous Tissue): Yes Fat Layer (Subcutaneous Tissue): Yes Fat Layer (Subcutaneous Tissue): Yes] Exposed Structures: [16:Fascia: No Tendon: No Muscle: No Joint: No Bone: No Small (1-33%)] [17:Fascia: No Tendon: No Muscle: No Joint: No Bone: No Large (67-100%)] [18:Fascia: No Tendon: No Muscle: No Joint: No Bone: No Large (67-100%)] Epithelialization: [16:Debridement - Excisional] [17:Debridement - Excisional] [18:Debridement - Excisional] Debridement: Pre-procedure Verification/Time Out 14:24 [17:14:24] [18:14:24] Taken: [16:Lidocaine 4% Topical Solution] [17:Lidocaine 4% Topical Solution] [18:Lidocaine 4% Topical Solution] Pain Control: [16:Callus, Subcutaneous] [17:Subcutaneous] [18:Subcutaneous] Tissue Debrided: [16:Skin/Subcutaneous Tissue] [17:Skin/Subcutaneous Tissue] [18:Skin/Subcutaneous Tissue] Level: [16:1.32] [17:0.25] [18:0.04] Debridement A (sq cm): [16:rea Curette] [17:Curette] [18:Curette] Instrument: [16:Minimum] [17:Minimum] [18:Minimum] Bleeding: [16:Pressure] [17:Pressure] [18:Pressure] Hemostasis A chieved: [16:Procedure was tolerated well] [17:Procedure was tolerated well] [18:Procedure was tolerated well] Debridement Treatment Response: [16:1.1x1.2x0.4] [17:0.5x0.5x0.1] [18:0.2x0.2x0.1] Post Debridement Measurements L x W x D (cm) [16:0.415] [17:0.02] [id number] Post Debridement Volume: (cm) [16:Debridement] [17:Debridement] [18:Debridement] Treatment Notes Electronic Signature(s) Signed: 03/12/2021 2:54:46 PM By: [name] DO Signed: 03/12/2021 5:13:52 PM By: Lorrin Jackson Entered  By: [name] on 03/12/2021 14:49:58 -------------------------------------------------------------------------------- Multi-Disciplinary Care Plan Details Patient Name: Date of Service: Karla Corwin, [name] 03/12/2021 1:00 PM Medical Record Number: [id number] Patient Account Number: [id number] Date of Birth/Sex: Treating RN: 17-May-1961 (60 y.o. [name] Primary Care Aricela Bertagnolli: SYSTEM, PRO Ave Filter Other Clinician: Referring Abdelaziz Westenberger: Treating Ceclia Koker/Extender: Yaakov Guthrie in Treatment: 3 Active Inactive Abuse / Safety / Falls / Self Care Management Nursing Diagnoses: History of Falls Goals: Patient will not experience any injury related to falls Date Initiated: 02/19/2021 Target Resolution Date: 03/26/2021 Goal Status: Active Interventions: Assess Activities of Daily Living upon admission and as needed Assess fall risk on admission and as needed Provide education on fall prevention Notes: Nutrition Nursing Diagnoses: Impaired glucose control: actual or potential Goals: Patient/caregiver verbalizes understanding of need to maintain therapeutic glucose control per primary care physician Date Initiated: 02/19/2021 Target Resolution Date: 03/26/2021 Goal Status: Active Interventions: Assess patient nutrition upon admission and as needed per policy Provide education on elevated blood sugars and impact on wound healing Provide education on nutrition Notes: Orientation to the Wound Care Program Nursing Diagnoses: Knowledge deficit related to the wound healing center program Goals: Patient/caregiver will verbalize understanding of the [facility] Program Date Initiated: 02/19/2021 Target Resolution Date: 03/19/2021 Goal Status: Active Interventions: Provide education on orientation to the wound center Notes: Wound/Skin Impairment Nursing Diagnoses: Impaired tissue integrity Goals: Patient/caregiver will verbalize understanding of skin care  regimen Date Initiated: 02/19/2021 Target  Resolution Date: 03/19/2021 Goal Status: Active Ulcer/skin breakdown will have a volume reduction of 30% by week 4 Date Initiated: 02/19/2021 Target Resolution Date: 03/19/2021 Goal Status: Active Interventions: Assess patient/caregiver ability to obtain necessary supplies Assess patient/caregiver ability to perform ulcer/skin care regimen upon admission and as needed Assess ulceration(s) every visit Provide education on ulcer and skin care Treatment Activities: Skin care regimen initiated : 02/19/2021 Topical wound management initiated : 02/19/2021 Notes: Electronic Signature(s) Signed: 03/12/2021 1:58:29 PM By: Lorrin Jackson Entered By: Lorrin Jackson on 03/12/2021 13:58:29 -------------------------------------------------------------------------------- Pain Assessment Details Patient Name: Date of Service: Karla Corwin, [name] 03/12/2021 1:00 PM Medical Record Number: [id number] Patient Account Number: [id number] Date of Birth/Sex: Treating RN: Aug 25, 1961 (60 y.o. [name] Primary Care Hilmar Moldovan: SYSTEM, PRO V IDER Other Clinician: Referring Kevron Patella: Treating Izack Hoogland/Extender: Yaakov Guthrie in Treatment: 3 Active Problems Location of Pain Severity and Description of Pain Patient Has Paino Yes Site Locations Pain Location: Generalized Pain, Pain in Ulcers Rate the pain. Current Pain Level: 2 Character of Pain Describe the Pain: Heavy, Sharp Pain Management and Medication Current Pain Management: Medication: Yes Cold Application: No Rest: No Massage: No Activity: No T.E.N.S.: No Heat Application: No Leg drop or elevation: No Is the Current Pain Management Adequate: Adequate How does your wound impact your activities of daily livingo Sleep: No Bathing: No Appetite: No Relationship With Others: No Bladder Continence: No Emotions: No Bowel Continence: No Work: No Toileting: No Drive: No Dressing: No Hobbies:  No Electronic Signature(s) Signed: 03/12/2021 5:30:47 PM By: Deon Pilling Entered By: Deon Pilling on 03/12/2021 13:22:51 -------------------------------------------------------------------------------- Patient/Caregiver Education Details Patient Name: Date of Service: [name] 4/29/2022andnbsp1:00 PM Medical Record Number: [id number] Patient Account Number: [id number] Date of Birth/Gender: Treating RN: 02-17-1961 (60 y.o. [name] Primary Care Physician: SYSTEM, PRO V IDER Other Clinician: Referring Physician: Treating Physician/Extender: Yaakov Guthrie in Treatment: 3 Education Assessment Education Provided To: Patient Education Topics Provided Elevated Blood Sugar/ Impact on Healing: Methods: Explain/Verbal, Printed Responses: State content correctly Offloading: Methods: Explain/Verbal, Printed Responses: State content correctly Wound/Skin Impairment: Methods: Demonstration, Explain/Verbal, Printed Responses: State content correctly Electronic Signature(s) Signed: 03/12/2021 5:13:52 PM By: Lorrin Jackson Entered By: Lorrin Jackson on 03/12/2021 13:59:01 -------------------------------------------------------------------------------- Wound Assessment Details Patient Name: Date of Service: Karla Corwin, [name] 03/12/2021 1:00 PM Medical Record Number: [id number] Patient Account Number: [id number] Date of Birth/Sex: Treating RN: 1960/11/18 (60 y.o. [name], Meta.Reding Primary Care Abe Schools: SYSTEM, PRO V IDER Other Clinician: Referring Brizza Nathanson: Treating Naleigha Raimondi/Extender: Yaakov Guthrie in Treatment: 3 Wound Status Wound Number: 16 Primary Diabetic Wound/Ulcer of the Lower Extremity Etiology: Wound Location: Right, Plantar Amputation Site - Transmetatarsal Wound Open Wounding Event: Gradually Appeared Status: Date Acquired: 02/05/2021 Comorbid Chronic Obstructive Pulmonary Disease (COPD), Sleep Apnea, Weeks Of Treatment: 3 History:  Arrhythmia, Congestive Heart Failure, Coronary Artery Disease, Clustered Wound: No Hypertension, Peripheral Arterial Disease, Cirrhosis , Type II Diabetes, Osteoarthritis, Neuropathy Photos Wound Measurements Length: (cm) 1.1 Width: (cm) 1.2 Depth: (cm) 0.4 Area: (cm) 1.037 Volume: (cm) 0.415 % Reduction in Area: 36.5% % Reduction in Volume: 15.3% Epithelialization: Small (1-33%) Tunneling: Yes Position (o'clock): 9 Maximum Distance: (cm) 0.5 Undermining: No Wound Description Classification: Grade 2 Wound Margin: Distinct, outline attached Exudate Amount: Medium Exudate Type: Serosanguineous Exudate Color: red, brown Foul Odor After Cleansing: No Slough/Fibrino Yes Wound Bed Granulation Amount: Medium (34-66%) Exposed Structure Granulation Quality: Red, Pink Fascia Exposed: No Necrotic Amount: Medium (34-66%) Fat Layer (Subcutaneous Tissue) Exposed: Yes Necrotic  Quality: Adherent Slough Tendon Exposed: No Muscle Exposed: No Joint Exposed: No Bone Exposed: No Treatment Notes 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 Peri-Wound Care Topical Primary Dressing IODOFLEX 0.9% Cadexomer Iodine Pad 4x6 cm Discharge Instruction: Apply to wound bed as instructed Secondary Dressing Woven Gauze Sponge, Non-Sterile 4x4 in Discharge Instruction: Apply over primary dressing as directed. Secured With Compression Wrap Kerlix Roll 4.5x3.1 (in/yd) Discharge Instruction: Apply Kerlix and Coban compression as directed. Coban Self-Adherent Wrap 4x5 (in/yd) Discharge Instruction: Apply over Kerlix as directed. Compression Stockings Add-Ons Electronic Signature(s) Signed: 03/12/2021 4:35:52 PM By: Sandre Kitty Signed: 03/12/2021 5:30:47 PM By: Deon Pilling Entered By: Sandre Kitty on 03/12/2021  16:31:41 -------------------------------------------------------------------------------- Wound Assessment Details Patient Name: Date of Service: Karla Corwin, [name] 03/12/2021 1:00 PM Medical Record Number: [id number] Patient Account Number: [id number] Date of Birth/Sex: Treating RN: October 02, 1961 (60 y.o. [name], Meta.Reding Primary Care Nicolina Hirt: SYSTEM, PRO V IDER Other Clinician: Referring Jackalyn Haith: Treating Anthea Udovich/Extender: Yaakov Guthrie in Treatment: 3 Wound Status Wound Number: 17 Primary Diabetic Wound/Ulcer of the Lower Extremity Etiology: Wound Location: Right, Medial Lower Leg Wound Open Wounding Event: Blister Status: Date Acquired: 02/05/2021 Comorbid Chronic Obstructive Pulmonary Disease (COPD), Sleep Apnea, Weeks Of Treatment: 3 History: Arrhythmia, Congestive Heart Failure, Coronary Artery Disease, Clustered Wound: No Hypertension, Peripheral Arterial Disease, Cirrhosis , Type II Diabetes, Osteoarthritis, Neuropathy Photos Wound Measurements Length: (cm) 0.5 Width: (cm) 0.5 Depth: (cm) 0.1 Area: (cm) 0.196 Volume: (cm) 0.02 % Reduction in Area: 99.3% % Reduction in Volume: 99.3% Epithelialization: Large (67-100%) Tunneling: No Undermining: No Wound Description Classification: Grade 2 Wound Margin: Distinct, outline attached Exudate Amount: Medium Exudate Type: Serosanguineous Exudate Color: red, brown Foul Odor After Cleansing: No Slough/Fibrino No Wound Bed Granulation Amount: Large (67-100%) Exposed Structure Granulation Quality: Red, Pink Fascia Exposed: No Necrotic Amount: None Present (0%) Fat Layer (Subcutaneous Tissue) Exposed: Yes Tendon Exposed: No Muscle Exposed: No Joint Exposed: No Bone Exposed: No Treatment Notes Wound #17 (Lower Leg) Wound Laterality: Right, Medial Cleanser Soap and Water Discharge Instruction: May shower and wash wound with dial antibacterial soap and water prior to dressing change. Peri-Wound  Care Topical Primary Dressing KerraCel Ag Gelling Fiber Dressing, 4x5 in (silver alginate) Discharge Instruction: Apply silver alginate to wound bed as instructed Secondary Dressing Woven Gauze Sponge, Non-Sterile 4x4 in Discharge Instruction: Apply over primary dressing as directed. ABD Pad, 8x10 Discharge Instruction: Apply over primary dressing as directed. Secured With Compression Wrap Kerlix Roll 4.5x3.1 (in/yd) Discharge Instruction: Apply Kerlix and Coban compression as directed. Coban Self-Adherent Wrap 4x5 (in/yd) Discharge Instruction: Apply over Kerlix as directed. Compression Stockings Add-Ons Electronic Signature(s) Signed: 03/12/2021 4:35:52 PM By: Sandre Kitty Signed: 03/12/2021 5:30:47 PM By: Deon Pilling Entered By: Sandre Kitty on 03/12/2021 16:32:05 -------------------------------------------------------------------------------- Wound Assessment Details Patient Name: Date of Service: Karla Corwin, [name] 03/12/2021 1:00 PM Medical Record Number: [id number] Patient Account Number: [id number] Date of Birth/Sex: Treating RN: 1961-02-11 (60 y.o. [name] Primary Care Montina Dorrance: SYSTEM, PRO V IDER Other Clinician: Referring Davionte Lusby: Treating Iban Utz/Extender: Yaakov Guthrie in Treatment: 3 Wound Status Wound Number: 18 Primary Diabetic Wound/Ulcer of the Lower Extremity Etiology: Wound Location: Right, Anterior Lower Leg Wound Open Wounding Event: Trauma Status: Date Acquired: 02/05/2021 Comorbid Chronic Obstructive Pulmonary Disease (COPD), Sleep Apnea, Weeks Of Treatment: 3 History: Arrhythmia, Congestive Heart Failure, Coronary Artery Disease, Clustered Wound:  No Hypertension, Peripheral Arterial Disease, Cirrhosis , Type II Diabetes, Osteoarthritis, Neuropathy Photos Wound Measurements Length: (cm) 0.2 Width: (cm) 0.2 Depth: (cm) 0.1 Area: (cm) 0.031 Volume: (cm) 0.003 % Reduction in Area: 99.9% % Reduction in Volume:  99.9% Epithelialization: Large (67-100%) Tunneling: No Undermining: No Wound Description Classification: Grade 2 Wound Margin: Distinct, outline attached Exudate Amount: Medium Exudate Type: Serosanguineous Exudate Color: red, brown Foul Odor After Cleansing: No Slough/Fibrino Yes Wound Bed Granulation Amount: Large (67-100%) Exposed Structure Granulation Quality: Red, Pink Fascia Exposed: No Necrotic Amount: None Present (0%) Fat Layer (Subcutaneous Tissue) Exposed: Yes Tendon Exposed: No Muscle Exposed: No Joint Exposed: No Bone Exposed: No Treatment Notes Wound #18 (Lower Leg) Wound Laterality: Right, Anterior Cleanser Soap and Water Discharge Instruction: May shower and wash wound with dial antibacterial soap and water prior to dressing change. Peri-Wound Care Topical Primary Dressing KerraCel Ag Gelling Fiber Dressing, 4x5 in (silver alginate) Discharge Instruction: Apply silver alginate to wound bed as instructed Secondary Dressing Woven Gauze Sponge, Non-Sterile 4x4 in Discharge Instruction: Apply over primary dressing as directed. ABD Pad, 8x10 Discharge Instruction: Apply over primary dressing as directed. Secured With Compression Wrap Kerlix Roll 4.5x3.1 (in/yd) Discharge Instruction: Apply Kerlix and Coban compression as directed. Coban Self-Adherent Wrap 4x5 (in/yd) Discharge Instruction: Apply over Kerlix as directed. Compression Stockings Add-Ons Electronic Signature(s) Signed: 03/12/2021 4:35:52 PM By: Sandre Kitty Signed: 03/12/2021 5:30:47 PM By: Deon Pilling Entered By: Sandre Kitty on 03/12/2021 16:31:13 -------------------------------------------------------------------------------- Vitals Details Patient Name: Date of Service: Karla Corwin, [name] 03/12/2021 1:00 PM Medical Record Number: [id number] Patient Account Number: [id number] Date of Birth/Sex: Treating RN: 1960-11-21 (60 y.o. [name], Meta.Reding Primary Care Malaney Mcbean: SYSTEM, PRO  V IDER Other Clinician: Referring Rosabell Geyer: Treating Jester Klingberg/Extender: Yaakov Guthrie in Treatment: 3 Vital Signs Time Taken: 13:09 Temperature (F): 98.5 Pulse (bpm): 81 Respiratory Rate (breaths/min): 20 Blood Pressure (mmHg): 100/61 Capillary Blood Glucose (mg/dl): 285 Reference Range: 80 - 120 mg / dl Notes Per patient orthopedist performed a steroid injection to left hand/wrist this week. Electronic Signature(s) Signed: 03/12/2021 5:30:47 PM By: Deon Pilling Entered By: Deon Pilling on 03/12/2021 13:22:36

## 2021-03-19 ENCOUNTER — Other Ambulatory Visit: Payer: Self-pay

## 2021-03-19 ENCOUNTER — Encounter (HOSPITAL_BASED_OUTPATIENT_CLINIC_OR_DEPARTMENT_OTHER): Payer: Medicare Other | Attending: Internal Medicine | Admitting: Internal Medicine

## 2021-03-19 DIAGNOSIS — I10 Essential (primary) hypertension: Secondary | ICD-10-CM | POA: Insufficient documentation

## 2021-03-19 DIAGNOSIS — E1151 Type 2 diabetes mellitus with diabetic peripheral angiopathy without gangrene: Secondary | ICD-10-CM | POA: Diagnosis not present

## 2021-03-19 DIAGNOSIS — E11621 Type 2 diabetes mellitus with foot ulcer: Secondary | ICD-10-CM | POA: Insufficient documentation

## 2021-03-19 DIAGNOSIS — L97518 Non-pressure chronic ulcer of other part of right foot with other specified severity: Secondary | ICD-10-CM | POA: Diagnosis not present

## 2021-03-19 NOTE — Progress Notes (Signed)
**Note Karla-Identified via Obfuscation** [name], [name] ([id number]) Visit Report for 03/19/2021 Arrival Information Details Patient Name: Date of Service: [name] 03/19/2021 2:45 PM Medical Record Number: [id number] Patient Account Number: [id number] Date of Birth/Sex: Treating RN: 21-Dec-1960 (60 y.o. [name] Primary Care Raghad Lorenz: SYSTEM, PRO V IDER Other Clinician: Referring Belkys Henault: Treating Amiya Escamilla/Extender: [name]: 4 Visit Information History Since Last Visit Added or deleted any medications: No Patient Arrived: Wheel Chair Any new allergies or adverse reactions: No Arrival Time: 14:57 Had a fall or experienced change in No Accompanied By: self activities of daily living that may affect Transfer Assistance: None risk of falls: Patient Identification Verified: Yes Signs or symptoms of abuse/neglect since last visito No Secondary Verification Process Completed: Yes Hospitalized since last visit: No Patient Requires Transmission-Based Precautions: No Implantable device outside of the clinic excluding No Patient Has Alerts: No cellular tissue based products placed in the center since last visit: Has Dressing in Place as Prescribed: Yes Pain Present Now: No Electronic Signature(s) Signed: 03/19/2021 4:45:41 PM By: Sandre Kitty Entered By: Sandre Kitty on 03/19/2021 14:58:53 -------------------------------------------------------------------------------- Encounter Discharge Information Details Patient Name: Date of Service: Karla Corwin, Karla [name] 03/19/2021 2:45 PM Medical Record Number: [id number] Patient Account Number: [id number] Date of Birth/Sex: Treating RN: May 13, 1961 (60 y.o. [name] Primary Care Myli Pae: SYSTEM, PRO V IDER Other Clinician: Referring Waldon Sheerin: Treating Sreshta Cressler/Extender: [name]: 4 Encounter Discharge Information Items Post Procedure Vitals Discharge Condition: Stable Temperature (F): 97.9 Ambulatory  Status: Ambulatory Pulse (bpm): 61 Discharge Destination: Home Respiratory Rate (breaths/min): 20 Transportation: Private Auto Blood Pressure (mmHg): 104/54 Accompanied By: self Schedule Follow-up Appointment: Yes Clinical Summary of Care: Electronic Signature(s) Signed: 03/19/2021 5:05:26 PM By: Deon Pilling Entered By: Deon Pilling on 03/19/2021 16:59:16 -------------------------------------------------------------------------------- Lower Extremity Assessment Details Patient Name: Date of Service: [name] 03/19/2021 2:45 PM Medical Record Number: [id number] Patient Account Number: [id number] Date of Birth/Sex: Treating RN: Feb 04, 1961 (60 y.o. [name] Primary Care Anabelle Bungert: SYSTEM, PRO Ave Filter Other Clinician: Referring Anissia Wessells: Treating Ki Corbo/Extender: [name]: 4 Edema Assessment Assessed: [Left: No] [Right: No] Edema: [Left: Ye] [Right: s] Calf Left: Right: Point of Measurement: From Medial Instep 39.2 cm Ankle Left: Right: Point of Measurement: From Medial Instep 21 cm Vascular Assessment Pulses: Dorsalis Pedis Palpable: [Right:No] Electronic Signature(s) Signed: 03/19/2021 5:11:03 PM By: Baruch Gouty RN, BSN Entered By: Baruch Gouty on 03/19/2021 15:07:19 -------------------------------------------------------------------------------- Multi Wound Chart Details Patient Name: Date of Service: Karla Corwin, Karla [name] 03/19/2021 2:45 PM Medical Record Number: [id number] Patient Account Number: [id number] Date of Birth/Sex: Treating RN: [birth year] (60 y.o. [name] Primary Care Brysin Towery: SYSTEM, PRO V IDER Other Clinician: Referring Shanell Aden: Treating Namrata Dangler/Extender: [name]: 4 Vital Signs Height(in): Capillary Blood Glucose(mg/dl): 222 Weight(lbs): Pulse(bpm): 56 Body Mass Index(BMI): Blood Pressure(mmHg): 104/54 Temperature(F): 97.9 Respiratory Rate(breaths/min):  20 Photos: [16:No Photos Right, Plantar Amputation Site -] [17:No Photos Right, Medial Lower Leg] [18:No Photos Right, Anterior Lower Leg] Wound Location: [16:Transmetatarsal Gradually Appeared] [17:Blister] [18:Trauma] Wounding Event: [16:Diabetic Wound/Ulcer of the Lower] [17:Diabetic Wound/Ulcer of the Lower] [18:Diabetic Wound/Ulcer of the Lower] Primary Etiology: [16:Extremity Chronic Obstructive Pulmonary] [17:Extremity Chronic Obstructive Pulmonary] [18:Extremity Chronic Obstructive Pulmonary] Comorbid History: [16:Disease (COPD), Sleep Apnea, Arrhythmia, Congestive Heart Failure, Coronary Artery Disease, Hypertension, Peripheral Arterial Disease, Cirrhosis , Type II Diabetes, Disease, Cirrhosis , Type II Diabetes, Osteoarthritis, Neuropathy  02/05/2021] [17:Disease (COPD), Sleep Apnea, Arrhythmia, Congestive Heart Failure, Coronary Artery  Disease, Hypertension, Peripheral Arterial Osteoarthritis, Neuropathy 02/05/2021] [18:Disease (COPD), Sleep Apnea, Arrhythmia, Congestive Heart Failure,  Coronary Artery Disease, Hypertension, Peripheral Arterial Disease, Cirrhosis , Type II Diabetes, Osteoarthritis, Neuropathy 02/05/2021] Date Acquired: [16:4] [17:4] [18:4] Weeks of Treatment: [16:Open] [17:Healed - Epithelialized] [18:Open] Wound Status: [16:1.1x1.3x0.4] [17:0x0x0] [18:0.2x0.5x0.1] Measurements L x W x D (cm) [16:1.123] [17:0] [18:0.079] A (cm) : rea [16:0.449] [17:0] [16:9.450] Volume (cm) : [16:31.30%] [17:100.00%] [18:99.70%] % Reduction in A [16:rea: 8.40%] [17:100.00%] [18:99.70%] % Reduction in Volume: [16:1] Starting Position 1 (o'clock): [16:5] Ending Position 1 (o'clock): [16:0.4] Maximum Distance 1 (cm): [16:Yes] [17:No] [18:No] Undermining: [16:Grade 2] [17:Grade 2] [18:Grade 2] Classification: [16:Medium] [17:None Present] [18:Small] Exudate A mount: [16:Serosanguineous] [17:N/A] [18:Serosanguineous] Exudate Type: [16:red, brown] [17:N/A] [18:red, brown] Exudate Color:  [16:Thickened] [17:N/A] [18:Distinct, outline attached] Wound Margin: [16:Small (1-33%)] [17:None Present (0%)] [18:Large (67-100%)] Granulation A mount: [16:Pink, Pale] [17:N/A] [18:Pink] Granulation Quality: [16:Large (67-100%)] [17:None Present (0%)] [18:None Present (0%)] Necrotic A mount: [16:Fat Layer (Subcutaneous Tissue): Yes Fascia: No] [18:Fat Layer (Subcutaneous Tissue): Yes] Exposed Structures: [16:Fascia: No Tendon: No Muscle: No Joint: No Bone: No Small (1-33%)] [17:Fat Layer (Subcutaneous Tissue): No Tendon: No Muscle: No Joint: No Bone: No Large (67-100%)] [18:Fascia: No Tendon: No Muscle: No Joint: No Bone: No Large (67-100%)] Epithelialization: [16:Debridement - Excisional] [17:N/A] [18:N/A] Debridement: Pre-procedure Verification/Time Out 15:37 [17:N/A] [18:N/A] Taken: [16:Lidocaine 4% Topical Solution] [17:N/A] [18:N/A] Pain Control: [16:Subcutaneous, Slough] [17:N/A] [18:N/A] Tissue Debrided: [16:Skin/Subcutaneous Tissue] [17:N/A] [18:N/A] Level: [16:1.43] [17:N/A] [18:N/A] Debridement A (sq cm): [16:rea Curette] [17:N/A] [18:N/A] Instrument: [16:Minimum] [17:N/A] [18:N/A] Bleeding: [16:Pressure] [17:N/A] [18:N/A] Hemostasis A chieved: [16:Procedure was tolerated well] [17:N/A] [18:N/A] Debridement Treatment Response: [16:1.1x1.3x0.4] [17:N/A] [18:N/A] Post Debridement Measurements L x W x D (cm) [16:0.449] [17:N/A] [18:N/A] Post Debridement Volume: (cm) [16:Debridement] [17:N/A] [18:N/A] Treatment Notes Electronic Signature(s) Signed: 03/19/2021 4:39:58 PM By: Linton Ham MD Signed: 03/19/2021 5:09:19 PM By: Lorrin Jackson Entered By: Linton Ham on 03/19/2021 15:53:52 -------------------------------------------------------------------------------- Multi-Disciplinary Care Plan Details Patient Name: Date of Service: Karla Corwin, Karla [name] 03/19/2021 2:45 PM Medical Record Number: [id number] Patient Account Number: [id number] Date of Birth/Sex: Treating  RN: 28-Dec-1960 (60 y.o. [name] Primary Care Destinie Thornsberry: SYSTEM, PRO Ave Filter Other Clinician: Referring Laquasia Pincus: Treating Khaleah Duer/Extender: [name]: 4 Active Inactive Abuse / Safety / Falls / Self Care Management Nursing Diagnoses: History of Falls Goals: Patient will not experience any injury related to falls Date Initiated: 02/19/2021 Target Resolution Date: 03/26/2021 Goal Status: Active Interventions: Assess Activities of Daily Living upon admission and as needed Assess fall risk on admission and as needed Provide education on fall prevention Notes: Nutrition Nursing Diagnoses: Impaired glucose control: actual or potential Goals: Patient/caregiver verbalizes understanding of need to maintain therapeutic glucose control per primary care physician Date Initiated: 02/19/2021 Target Resolution Date: 03/26/2021 Goal Status: Active Interventions: Assess patient nutrition upon admission and as needed per policy Provide education on elevated blood sugars and impact on wound healing Provide education on nutrition Notes: Wound/Skin Impairment Nursing Diagnoses: Impaired tissue integrity Goals: Patient/caregiver will verbalize understanding of skin care regimen Date Initiated: 02/19/2021 Date Inactivated: 03/19/2021 Target Resolution Date: 03/19/2021 Goal Status: Met Ulcer/skin breakdown will have a volume reduction of 30% by week 4 Date Initiated: 02/19/2021 Target Resolution Date: 04/09/2021 Goal Status: Active Interventions: Assess patient/caregiver ability to obtain necessary supplies Assess patient/caregiver ability to perform ulcer/skin care regimen upon admission and as needed Assess ulceration(s) every visit Provide education on ulcer and skin care Treatment Activities: Skin care regimen initiated : 02/19/2021 Topical  wound management initiated : 02/19/2021 Notes: Electronic Signature(s) Signed: 03/19/2021 5:09:19 PM By: Lorrin Jackson Previous Signature: 03/19/2021 3:02:15 PM Version By: Lorrin Jackson Entered By: Lorrin Jackson on 03/19/2021 15:46:47 -------------------------------------------------------------------------------- Pain Assessment Details Patient Name: Date of Service: Karla Corwin, Karla [name] 03/19/2021 2:45 PM Medical Record Number: [id number] Patient Account Number: [id number] Date of Birth/Sex: Treating RN: 1961-03-20 (60 y.o. [name] Primary Care Shamere Campas: SYSTEM, PRO Ave Filter Other Clinician: Referring Latika Kronick: Treating Maida Widger/Extender: [name]: 4 Active Problems Location of Pain Severity and Description of Pain Patient Has Paino No Site Locations Pain Management and Medication Current Pain Management: Electronic Signature(s) Signed: 03/19/2021 4:45:41 PM By: Sandre Kitty Signed: 03/19/2021 5:09:19 PM By: Lorrin Jackson Entered By: Sandre Kitty on 03/19/2021 14:59:57 -------------------------------------------------------------------------------- Patient/Caregiver Education Details Patient Name: Date of Service: [name] 5/6/2022andnbsp2:45 PM Medical Record Number: [id number] Patient Account Number: [id number] Date of Birth/Gender: Treating RN: 1961/02/27 (60 y.o. [name] Primary Care Physician: SYSTEM, PRO V IDER Other Clinician: Referring Physician: Treating Physician/Extender: [name]: 4 Education Assessment Education Provided To: Patient Education Topics Provided Elevated Blood Sugar/ Impact on Healing: Methods: Explain/Verbal, Printed Responses: State content correctly Venous: Methods: Explain/Verbal, Printed Responses: State content correctly Wound/Skin Impairment: Methods: Explain/Verbal, Printed Responses: State content correctly Electronic Signature(s) Signed: 03/19/2021 5:09:19 PM By: Lorrin Jackson Entered By: Lorrin Jackson on 03/19/2021  15:45:33 -------------------------------------------------------------------------------- Wound Assessment Details Patient Name: Date of Service: Karla Corwin, Karla [name] 03/19/2021 2:45 PM Medical Record Number: [id number] Patient Account Number: [id number] Date of Birth/Sex: Treating RN: [birth year] (60 y.o. [name] Primary Care Vennie Waymire: SYSTEM, PRO V IDER Other Clinician: Referring Naida Escalante: Treating Azlee Monforte/Extender: [name]: 4 Wound Status Wound Number: 16 Primary Diabetic Wound/Ulcer of the Lower Extremity Etiology: Wound Location: Right, Plantar Amputation Site - Transmetatarsal Wound Open Wounding Event: Gradually Appeared Status: Date Acquired: 02/05/2021 Comorbid Chronic Obstructive Pulmonary Disease (COPD), Sleep Apnea, Weeks Of Treatment: 4 History: Arrhythmia, Congestive Heart Failure, Coronary Artery Disease, Clustered Wound: No Hypertension, Peripheral Arterial Disease, Cirrhosis , Type II Diabetes, Osteoarthritis, Neuropathy Photos Wound Measurements Length: (cm) 1.1 Width: (cm) 1.3 Depth: (cm) 0.4 Area: (cm) 1.123 Volume: (cm) 0.449 % Reduction in Area: 31.3% % Reduction in Volume: 8.4% Epithelialization: Small (1-33%) Tunneling: No Undermining: Yes Starting Position (o'clock): 1 Ending Position (o'clock): 5 Maximum Distance: (cm) 0.4 Wound Description Classification: Grade 2 Wound Margin: Thickened Exudate Amount: Medium Exudate Type: Serosanguineous Exudate Color: red, brown Foul Odor After Cleansing: No Slough/Fibrino Yes Wound Bed Granulation Amount: Small (1-33%) Exposed Structure Granulation Quality: Pink, Pale Fascia Exposed: No Necrotic Amount: Large (67-100%) Fat Layer (Subcutaneous Tissue) Exposed: Yes Necrotic Quality: Adherent Slough Tendon Exposed: No Muscle Exposed: No Joint Exposed: No Bone Exposed: No Treatment Notes 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 Peri-Wound Care Topical Primary Dressing IODOFLEX 0.9% Cadexomer Iodine Pad 4x6 cm Discharge Instruction: Apply to wound bed as instructed Secondary Dressing Woven Gauze Sponge, Non-Sterile 4x4 in Discharge Instruction: Apply over primary dressing as directed. Secured With Compression Wrap Kerlix Roll 4.5x3.1 (in/yd) Discharge Instruction: Apply Kerlix and Coban compression as directed. Coban Self-Adherent Wrap 4x5 (in/yd) Discharge Instruction: Apply over Kerlix as directed. Compression Stockings Add-Ons Electronic Signature(s) Signed: 03/19/2021 4:45:41 PM By: Sandre Kitty Signed: 03/19/2021 5:11:03 PM By: Baruch Gouty RN, BSN Entered By: Sandre Kitty  on 03/19/2021 16:32:49 -------------------------------------------------------------------------------- Wound Assessment Details Patient Name: Date of Service: [name] 03/19/2021 2:45 PM Medical Record Number: [id number] Patient Account Number: [id number] Date of Birth/Sex: Treating RN: 1961/08/20 (60 y.o. [name] Primary Care Corley Maffeo: SYSTEM, PRO V IDER Other Clinician: Referring Hafsah Hendler: Treating Mithran Strike/Extender: [name]: 4 Wound Status Wound Number: 17 Primary Diabetic Wound/Ulcer of the Lower Extremity Etiology: Wound Location: Right, Medial Lower Leg Wound Healed - Epithelialized Wounding Event: Blister Status: Date Acquired: 02/05/2021 Comorbid Chronic Obstructive Pulmonary Disease (COPD), Sleep Apnea, Weeks Of Treatment: 4 History: Arrhythmia, Congestive Heart Failure, Coronary Artery Disease, Clustered Wound: No Hypertension, Peripheral Arterial Disease, Cirrhosis , Type II Diabetes, Osteoarthritis, Neuropathy Wound Measurements Length: (cm) Width: (cm) Depth: (cm) Area: (cm) Volume: (cm) 0 % Reduction in  Area: 100% 0 % Reduction in Volume: 100% 0 Epithelialization: Large (67-100%) 0 Tunneling: No 0 Undermining: No Wound Description Classification: Grade 2 Exudate Amount: None Present Foul Odor After Cleansing: No Slough/Fibrino No Wound Bed Granulation Amount: None Present (0%) Exposed Structure Necrotic Amount: None Present (0%) Fascia Exposed: No Fat Layer (Subcutaneous Tissue) Exposed: No Tendon Exposed: No Muscle Exposed: No Joint Exposed: No Bone Exposed: No Electronic Signature(s) Signed: 03/19/2021 5:11:03 PM By: Baruch Gouty RN, BSN Entered By: Baruch Gouty on 03/19/2021 15:08:46 -------------------------------------------------------------------------------- Wound Assessment Details Patient Name: Date of Service: Karla Corwin, Karla [name] 03/19/2021 2:45 PM Medical Record Number: [id number] Patient Account Number: [id number] Date of Birth/Sex: Treating RN: 01-19-1961 (60 y.o. [name] Primary Care Azalia Neuberger: SYSTEM, PRO V IDER Other Clinician: Referring Jamesetta Greenhalgh: Treating Cruise Baumgardner/Extender: [name]: 4 Wound Status Wound Number: 18 Primary Diabetic Wound/Ulcer of the Lower Extremity Etiology: Wound Location: Right, Anterior Lower Leg Wound Open Wounding Event: Trauma Status: Date Acquired: 02/05/2021 Comorbid Chronic Obstructive Pulmonary Disease (COPD), Sleep Apnea, Weeks Of Treatment: 4 History: Arrhythmia, Congestive Heart Failure, Coronary Artery Disease, Clustered Wound: No Hypertension, Peripheral Arterial Disease, Cirrhosis , Type II Diabetes, Osteoarthritis, Neuropathy Photos Wound Measurements Length: (cm) 0.2 Width: (cm) 0.5 Depth: (cm) 0.1 Area: (cm) 0.079 Volume: (cm) 0.008 % Reduction in Area: 99.7% % Reduction in Volume: 99.7% Epithelialization: Large (67-100%) Tunneling: No Undermining: No Wound Description Classification: Grade 2 Wound Margin: Distinct, outline attached Exudate Amount: Small Exudate  Type: Serosanguineous Exudate Color: red, brown Foul Odor After Cleansing: No Slough/Fibrino Yes Wound Bed Granulation Amount: Large (67-100%) Exposed Structure Granulation Quality: Pink Fascia Exposed: No Necrotic Amount: None Present (0%) Fat Layer (Subcutaneous Tissue) Exposed: Yes Tendon Exposed: No Muscle Exposed: No Joint Exposed: No Bone Exposed: No Treatment Notes Wound #18 (Lower Leg) Wound Laterality: Right, Anterior Cleanser Soap and Water Discharge Instruction: May shower and wash wound with dial antibacterial soap and water prior to dressing change. Peri-Wound Care Topical Primary Dressing KerraCel Ag Gelling Fiber Dressing, 4x5 in (silver alginate) Discharge Instruction: Apply silver alginate to wound bed as instructed Secondary Dressing Woven Gauze Sponge, Non-Sterile 4x4 in Discharge Instruction: Apply over primary dressing as directed. ABD Pad, 8x10 Discharge Instruction: Apply over primary dressing as directed. Secured With Compression Wrap Kerlix Roll 4.5x3.1 (in/yd) Discharge Instruction: Apply Kerlix and Coban compression as directed. Coban Self-Adherent Wrap 4x5 (in/yd) Discharge Instruction: Apply over Kerlix as directed. Compression Stockings Add-Ons Electronic Signature(s) Signed: 03/19/2021 4:45:41 PM By: Sandre Kitty Signed: 03/19/2021 5:11:03 PM By: Baruch Gouty RN, BSN Entered By: Sandre Kitty on 03/19/2021 16:32:23 -------------------------------------------------------------------------------- Vitals Details Patient Name: Date of Service: [place], Karla [name] 03/19/2021 2:45 PM Medical Record  Number: [id number] Patient Account Number: [id number] Date of Birth/Sex: Treating RN: Jul 19, 1961 (60 y.o. [name] Primary Care Taysia Rivere: SYSTEM, PRO V IDER Other Clinician: Referring Kimble Hitchens: Treating Ameri Cahoon/Extender: [name]: 4 Vital Signs Time Taken: 14:58 Temperature (F): 97.9 Pulse (bpm):  61 Respiratory Rate (breaths/min): 20 Blood Pressure (mmHg): 104/54 Capillary Blood Glucose (mg/dl): 222 Reference Range: 80 - 120 mg / dl Electronic Signature(s) Signed: 03/19/2021 4:45:41 PM By: Sandre Kitty Entered By: Sandre Kitty on 03/19/2021 14:59:33

## 2021-03-19 NOTE — Progress Notes (Addendum)
Karla, Price ([id number]) Visit Report for 03/19/2021 Debridement Details Patient Name: Date of Service: [name] 03/19/2021 2:45 PM Medical Record Number: [id number] Patient Account Number: [id number] Date of Birth/Sex: Treating RN: 04/13/1961 (60 y.o. [name] Primary Care Provider: SYSTEM, PRO V IDER Other Clinician: Referring Provider: Treating Provider/Extender: [name]: 4 Debridement Performed for Assessment: Wound #16 Right,Plantar Amputation Site - Transmetatarsal Performed By: Physician Ricard Dillon., MD Debridement Type: Debridement Severity of Tissue Pre Debridement: Fat layer exposed Level of Consciousness (Pre-procedure): Awake and Alert Pre-procedure Verification/Time Out Yes - 15:37 Taken: Start Time: 15:38 Pain Control: Lidocaine 4% T opical Solution T Area Debrided (L x W): otal 1.1 (cm) x 1.3 (cm) = 1.43 (cm) Tissue and other material debrided: Non-Viable, Slough, Subcutaneous, Slough Level: Skin/Subcutaneous Tissue Debridement Description: Excisional Instrument: Curette Bleeding: Minimum Hemostasis Achieved: Pressure End Time: 15:42 Response to Treatment: Procedure was tolerated well Level of Consciousness (Post- Awake and Alert procedure): Post Debridement Measurements of Total Wound Length: (cm) 1.1 Width: (cm) 1.3 Depth: (cm) 0.4 Volume: (cm) 0.449 Character of Wound/Ulcer Post Debridement: Stable Severity of Tissue Post Debridement: Fat layer exposed Post Procedure Diagnosis Same as Pre-procedure Electronic Signature(s) Signed: 03/19/2021 4:39:58 PM By: Linton Ham MD Signed: 03/19/2021 5:09:19 PM By: Lorrin Jackson Entered By: Linton Ham on 03/19/2021 15:54:02 -------------------------------------------------------------------------------- HPI Details Patient Name: Date of Service: Karla Corwin, [name] 03/19/2021 2:45 PM Medical Record Number: [id number] Patient Account Number: [id number] Date  of Birth/Sex: Treating RN: 03/29/1961 (60 y.o. [name] Primary Care Provider: SYSTEM, PRO Ave Filter Other Clinician: Referring Provider: Treating Provider/Extender: [name]: 4 History of Present Illness HPI Description: This 60 year old patient who has a very long significant history of diabetes mellitus, previous alcohol and nicotine abuse, chronic data disease, COPD, diabetes mellitus, height hypertension, critical lower limb ischemia with several wounds being managed at the wound Center at [facility] for over a year. She was recently in the ER at [facility] and was referred to our center. He has had a long history of critical limb ischemia and over a period of time has had balloon angioplasties in March 2016, endarterectomy of the left carotid, lower extremity angiogram and treatment by Dr. Quay Burow, several cardiac catheterizations. Most recently she had an x-ray of her left foot while in the ER which showed no acute abnormality. during this ER visit she was started on ciprofloxacin and asked to continue with the wound care physicians at [facility]. Her last ABI done in June 2017 showed the right side to be 0.28 in the left side to be 0.48. Her right toe brachial index was 0.17 on the right and 0.27 on the left. her last hemoglobin A1c was 11.1% She continues to smoke about a pack of cigarettes a day. 03/28/2017 -- -- right foot x-ray -- IMPRESSION:Areas of soft tissue swelling. Mild subluxation second PIP joint. No frank dislocation or fracture. No erosive change or bony destruction. No soft tissue ulceration or radiopaque foreign body evident by radiography. There is plantar fascia calcification with a nearby inferior calcaneal spur. There are foci of arterial vascular calcification. 04/04/2017 -- the patient continues to be noncompliant and continues to complain of a lot of pain and has not done anything about quitting  smoking Readmission: 06/26/18 on evaluation today patient presents for reevaluation she has not been seen in our office since May 2018. Since she was last seen here in our office she  has undergone testing at Dr. Elspeth Cho office where it was revealed that she had an ABI of 0.68 with her previous ABI being 0.64 and a left ABI of 0.38 with previous ABI being 0.34 this study which was performed on 04/05/18 was pretty much equivalent to the study performed on 11/22/17. The findings in the end revealed on the right would appear to be moderate right lower extremity arterial disease on the left Dr. Gwenlyn Price states moderate in the report but unfortunately this appears to be much more severe at 0.38 compared to the right. Subsequently the patient was scheduled to have angiography with Dr. Gwenlyn Price on 04/12/18. This was however canceled due to the fact that the patient was Price to have chronic kidney disease stage III and it was to the point that he did not feel that it will be safe to pursue angiography at that point. She has not been on any antibiotics recently. At this point Dr. Gwenlyn Price has not rescheduled anything as far as the injured [place] according to the patient he is somewhat reluctant to do so. Nonetheless with her diminished blood flow this is gonna make it somewhat difficult for her to heal. 07/05/18; this is a patient I have not seen previously. She has very significant PAD as noted above. She apparently has had revascularization efforts by Dr. Gwenlyn Price on the right on 3 occasions to the patient. She was supposed to have an attempted angiography on the left however this was canceled apparently because of stage III chronic renal failure. I will need to research all of this. She complains of significant pain in the wound and has claudication enough that when she walks to the end of the driveway she has to stop. She is been using silver alginate to the wounds on her legs and Iodoflex to the area on her  left second toe. 07/13/18 on evaluation today patient's wounds actually appear to be doing about the same. She has an appointment she tells me within the next month that is September 2019 with Dr. Gwenlyn Price to discuss options to see if there's anything else that you can recommend or do for her. Nonetheless obviously what we're trying to do is do what we can to save her leg and in turn prevent any additional worsening or damage. None in the meantime we been mainly trying to manage her ulcers as best we can. 07/27/18; some improvement in the multiplicity of wounds on her left lower extremity and foot. She's been using silver alginate. I was unable to determine that she actually has an appointment with Dr. Gwenlyn Price. We are checking into this The patient's wound includes Left lateral foot, left plantar heel, her left anterior calf wound looks close to me, left fourth toe is very close to closed and the left medial calf is perhaps the largest open area READMISSION 02/19/2021 This is a now 60 year old woman with type 2 diabetes and continued cigarette smoking. She has known PAD. She was last in this clinic in 2019 at that point with wounds on her left foot. She left in a nonhealed state. On 06/28/2019 I see she had a left femoropopliteal by vein and vascular with an amputation of the fifth ray. These managed to heal over. Her last arterial studies were in February 2021. This showed an absent waveform at the posterior tibial artery on the right a dampened monophasic dorsalis pedis on the right of 0.44. On the left again the PTA TA was absent her dorsalis pedis was 0.99 triphasic  and her great toe pressure was 0.65. This would have been after her revascularization. Once again she tells me that Dr. Gwenlyn Price has done revascularization on the right leg on 3 different occasions. She has a right transmetatarsal amputation apparently done by Dr. Sharol Given remotely but I do not see a note for these right lower  extremity revascularization but I may not of looked back far enough. In any case she says that the she has a wound on the plantar aspect of the right transmetatarsal amputation site there is been there for several months. More recently she fell and had a wound on her right medial lower leg she had 5 sutures placed in the ER and then subsequently she has developed an area on the medial lower leg which was a blister that opened up. Past medical history includes type 2 diabetes, PAD, chronic renal failure, congestive heart failure, right transmetatarsal amputation, left fifth ray amputation, continued tobacco abuse, atrial fibrillation and cirrhosis. We did not attempt an ABI on the right leg today because of pain 02/26/2021; patient we admitted to the clinic last week. She has what looks to be 2 areas on her right medial and right anterior lower leg that look more like venous wounds but she has 1 on the first met head at the base of her right transmet. She has known severe PAD. She complains of a lot of pain although some of this may be neuropathic. Is difficult to exclude a component of claudication. Use silver alginate on the wounds on her legs and Iodoflex on the area on the first met head. She has an appointment with Dr. Gwenlyn Price on 5/25 although I will text him and discussed the situation. She is have poorly controlled diabetic. She has has stage IIIb chronic renal failure 4/22; patient presents for 1 week follow-up. The 2 areas on her right medial and right anterior lower leg appear well-healing. She has been using silver alginate to this area without issues. She has a first met head ulcer and it is unclear how long this has been there as it was discovered in the ED earlier this month when she was being evaluated for another issue. Iodoflex has been used at this area. 4/29; patient presents for 1 week follow-up. She has been using silver alginate to the leg wounds and Iodoflex to the plantar foot  wound. She has had this wrapped with Coban and Kerlix. She has no concerns or complaints today. 5/6; this is a difficult wound on the right plantar foot transmit site. We are not making a lot of progress. She had 2 more venous looking wounds on the right medial and right anterior lower leg 1 of which is healed. Her appointment with Dr. Gwenlyn Price is on 5/25 Electronic Signature(s) Signed: 03/19/2021 4:39:58 PM By: Linton Ham MD Entered By: Linton Ham on 03/19/2021 15:56:10 -------------------------------------------------------------------------------- Physical Exam Details Patient Name: Date of Service: Karla Corwin, [name] 03/19/2021 2:45 PM Medical Record Number: [id number] Patient Account Number: [id number] Date of Birth/Sex: Treating RN: 08/07/1961 (60 y.o. [name] Primary Care Provider: SYSTEM, PRO Ave Filter Other Clinician: Referring Provider: Treating Provider/Extender: [name]: 4 Constitutional Sitting or standing Blood Pressure is within target range for patient.. Pulse regular and within target range for patient.[name] Respirations regular, non-labored and within target range.. Temperature is normal and within the target range for the patient.[name] Appears in no distress. Notes Wound exam; right anterior medial lower leg wounds are just about closed 1 is  closed the other is just about closed. Again the area on the met head really does not have a viable surface. I debrided this today but if this does not help were going to have to reach out to Dr. Gwenlyn Price I will. Also removed some of the callus from around the circumference Electronic Signature(s) Signed: 03/19/2021 4:39:58 PM By: Linton Ham MD Entered By: Linton Ham on 03/19/2021 15:58:27 -------------------------------------------------------------------------------- Physician Orders Details Patient Name: Date of Service: Karla Corwin, [name] 03/19/2021 2:45 PM Medical Record Number:  [id number] Patient Account Number: [id number] Date of Birth/Sex: Treating RN: 1961-02-03 (60 y.o. [name] Primary Care Provider: SYSTEM, PRO V IDER Other Clinician: Referring Provider: Treating Provider/Extender: [name]: 4 Verbal / Phone Orders: No Diagnosis Coding ICD-10 Coding Code Description 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.518 Non-pressure chronic ulcer of other part of right foot with other specified severity E11.51 Type 2 diabetes mellitus with diabetic peripheral angiopathy without gangrene L97.811 Non-pressure chronic ulcer of other part of right lower leg limited to breakdown of skin Follow-up Appointments ppointment in 1 week. - with Dr. Dellia Nims Return A Bathing/ Shower/ Hygiene May shower with protection but do not get wound dressing(s) wet. Edema Control - Lymphedema / SCD / Other Elevate legs to the level of the heart or above for 30 minutes daily and/or when sitting, a frequency of: Avoid standing for long periods of time. Exercise regularly Off-Loading Open toe surgical shoe to: - with felt padding to offload Additional Orders / Instructions Stop/Decrease Smoking Follow Nutritious Diet Wound Treatment Wound #16 - Amputation Site - Transmetatarsal Wound Laterality: Plantar, Right Cleanser: Soap and Water 1 x Per Week/15 Days Discharge Instructions: May shower and wash wound with dial antibacterial soap and water prior to dressing change. Prim Dressing: IODOFLEX 0.9% Cadexomer Iodine Pad 4x6 cm 1 x Per Week/15 Days ary Discharge Instructions: Apply to wound bed as instructed Secondary Dressing: Woven Gauze Sponge, Non-Sterile 4x4 in 1 x Per Week/15 Days Discharge Instructions: Apply over primary dressing as directed. Compression Wrap: Kerlix Roll 4.5x3.1 (in/yd) 1 x Per Week/15 Days Discharge Instructions: Apply Kerlix and Coban compression as directed. Compression Wrap: Coban Self-Adherent Wrap 4x5 (in/yd)  1 x Per Week/15 Days Discharge Instructions: Apply over Kerlix as directed. Wound #18 - Lower Leg Wound Laterality: Right, Anterior Cleanser: Soap and Water 1 x Per Week Discharge Instructions: May shower and wash wound with dial antibacterial soap and water prior to dressing change. Prim Dressing: KerraCel Ag Gelling Fiber Dressing, 4x5 in (silver alginate) 1 x Per Week ary Discharge Instructions: Apply silver alginate to wound bed as instructed Secondary Dressing: Woven Gauze Sponge, Non-Sterile 4x4 in 1 x Per Week Discharge Instructions: Apply over primary dressing as directed. Secondary Dressing: ABD Pad, 8x10 1 x Per Week Discharge Instructions: Apply over primary dressing as directed. Compression Wrap: Kerlix Roll 4.5x3.1 (in/yd) 1 x Per Week Discharge Instructions: Apply Kerlix and Coban compression as directed. Compression Wrap: Coban Self-Adherent Wrap 4x5 (in/yd) 1 x Per Week Discharge Instructions: Apply over Kerlix as directed. Electronic Signature(s) Signed: 03/19/2021 4:39:58 PM By: Linton Ham MD Signed: 03/19/2021 5:09:19 PM By: Lorrin Jackson Previous Signature: 03/19/2021 3:01:45 PM Version By: Lorrin Jackson Entered By: Lorrin Jackson on 03/19/2021 15:44:34 -------------------------------------------------------------------------------- Problem List Details Patient Name: Date of Service: Karla Corwin, [name] 03/19/2021 2:45 PM Medical Record Number: [id number] Patient Account Number: [id number] Date of Birth/Sex: Treating RN: January 27, 1961 (60 y.o. [name] Primary Care Provider: SYSTEM,  PRO V IDER Other Clinician: Referring Provider: Treating Provider/Extender: [name]: 4 Active Problems ICD-10 Encounter Code Description Active Date MDM Diagnosis 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 2 diabetes mellitus with foot ulcer 02/19/2021 No Yes L97.518 Non-pressure chronic ulcer of other part of right foot with other specified 02/19/2021 No Yes severity E11.51 Type  2 diabetes mellitus with diabetic peripheral angiopathy without gangrene 02/19/2021 No Yes L97.811 Non-pressure chronic ulcer of other part of right lower leg limited to breakdown 02/19/2021 No Yes of skin Inactive Problems Resolved Problems Electronic Signature(s) Signed: 03/19/2021 4:39:58 PM By: Linton Ham MD Previous Signature: 03/19/2021 3:00:42 PM Version By: Lorrin Jackson Entered By: Linton Ham on 03/19/2021 15:53:45 -------------------------------------------------------------------------------- Progress Note Details Patient Name: Date of Service: Karla Corwin, [name] 03/19/2021 2:45 PM Medical Record Number: [id number] Patient Account Number: [id number] Date of Birth/Sex: Treating RN: Mar 17, 1961 (60 y.o. [name] Primary Care Provider: SYSTEM, PRO V IDER Other Clinician: Referring Provider: Treating Provider/Extender: [name]: 4 Subjective History of Present Illness (HPI) This 60 year old patient who has a very long significant history of diabetes mellitus, previous alcohol and nicotine abuse, chronic data disease, COPD, diabetes mellitus, height hypertension, critical lower limb ischemia with several wounds being managed at the wound Center at [facility] for over a year. She was recently in the ER at [facility] and was referred to our center. He has had a long history of critical limb ischemia and over a period of time has had balloon angioplasties in March 2016, endarterectomy of the left carotid, lower extremity angiogram and treatment by Dr. Quay Burow, several cardiac catheterizations. Most recently she had an x-ray of her left foot while in the ER which showed no acute abnormality. during this ER visit she was started on ciprofloxacin and asked to continue with the wound care physicians at [facility]. Her last ABI done in June 2017 showed the right side to be 0.28 in the left side to be 0.48. Her right toe brachial index was  0.17 on the right and 0.27 on the left. her last hemoglobin A1c was 11.1% She continues to smoke about a pack of cigarettes a day. 03/28/2017 -- -- right foot x-ray -- IMPRESSION:Areas of soft tissue swelling. Mild subluxation second PIP joint. No frank dislocation or fracture. No erosive change or bony destruction. No soft tissue ulceration or radiopaque foreign body evident by radiography. There is plantar fascia calcification with a nearby inferior calcaneal spur. There are foci of arterial vascular calcification. 04/04/2017 -- the patient continues to be noncompliant and continues to complain of a lot of pain and has not done anything about quitting smoking Readmission: 06/26/18 on evaluation today patient presents for reevaluation she has not been seen in our office since May 2018. Since she was last seen here in our office she has undergone testing at Dr. Fransico Ryken [facility] office where it was revealed that she had an ABI of 0.68 with her previous ABI being 0.64 and a left ABI of 0.38 with previous ABI being 0.34 this study which was performed on 04/05/18 was pretty much equivalent to the study performed on 11/22/17. The findings in the end revealed on the right would appear to be moderate right lower extremity arterial disease on the left Dr. Gwenlyn Price states moderate in the report but unfortunately this appears to be much more severe at 0.38 compared to the right. Subsequently the patient was scheduled to have angiography with Dr. Gwenlyn Price on 04/12/18. This was however canceled  due to the fact that the patient was Price to have chronic kidney disease stage III and it was to the point that he did not feel that it will be safe to pursue angiography at that point. She has not been on any antibiotics recently. At this point Dr. Gwenlyn Price has not rescheduled anything as far as the injured [place] according to the patient he is somewhat reluctant to do so. Nonetheless with her diminished blood flow this is gonna  make it somewhat difficult for her to heal. 07/05/18; this is a patient I have not seen previously. She has very significant PAD as noted above. She apparently has had revascularization efforts by Dr. Gwenlyn Price on the right on 3 occasions to the patient. She was supposed to have an attempted angiography on the left however this was canceled apparently because of stage III chronic renal failure. I will need to research all of this. She complains of significant pain in the wound and has claudication enough that when she walks to the end of the driveway she has to stop. She is been using silver alginate to the wounds on her legs and Iodoflex to the area on her left second toe. 07/13/18 on evaluation today patient's wounds actually appear to be doing about the same. She has an appointment she tells me within the next month that is September 2019 with Dr. Gwenlyn Price to discuss options to see if there's anything else that you can recommend or do for her. Nonetheless obviously what we're trying to do is do what we can to save her leg and in turn prevent any additional worsening or damage. None in the meantime we been mainly trying to manage her ulcers as best we can. 07/27/18; some improvement in the multiplicity of wounds on her left lower extremity and foot. She's been using silver alginate. I was unable to determine that she actually has an appointment with Dr. Gwenlyn Price. We are checking into this The patient's wound includes Left lateral foot, left plantar heel, her left anterior calf wound looks close to me, left fourth toe is very close to closed and the left medial calf is perhaps the largest open area READMISSION 02/19/2021 This is a now 60 year old woman with type 2 diabetes and continued cigarette smoking. She has known PAD. She was last in this clinic in 2019 at that point with wounds on her left foot. She left in a nonhealed state. On 06/28/2019 I see she had a left femoropopliteal by vein and vascular with an  amputation of the fifth ray. These managed to heal over. Her last arterial studies were in February 2021. This showed an absent waveform at the posterior tibial artery on the right a dampened monophasic dorsalis pedis on the right of 0.44. On the left again the PTA TA was absent her dorsalis pedis was 0.99 triphasic and her great toe pressure was 0.65. This would have been after her revascularization. Once again she tells me that Dr. Gwenlyn Price has done revascularization on the right leg on 3 different occasions. She has a right transmetatarsal amputation apparently done by Dr. Sharol Given remotely but I do not see a note for these right lower extremity revascularization but I may not of looked back far enough. In any case she says that the she has a wound on the plantar aspect of the right transmetatarsal amputation site there is been there for several months. More recently she fell and had a wound on her right medial lower leg she had 5 sutures  placed in the ER and then subsequently she has developed an area on the medial lower leg which was a blister that opened up. Past medical history includes type 2 diabetes, PAD, chronic renal failure, congestive heart failure, right transmetatarsal amputation, left fifth ray amputation, continued tobacco abuse, atrial fibrillation and cirrhosis. We did not attempt an ABI on the right leg today because of pain 02/26/2021; patient we admitted to the clinic last week. She has what looks to be 2 areas on her right medial and right anterior lower leg that look more like venous wounds but she has 1 on the first met head at the base of her right transmet. She has known severe PAD. She complains of a lot of pain although some of this may be neuropathic. Is difficult to exclude a component of claudication. Use silver alginate on the wounds on her legs and Iodoflex on the area on the first met head. She has an appointment with Dr. Gwenlyn Price on 5/25 although I will text him and  discussed the situation. She is have poorly controlled diabetic. She has has stage IIIb chronic renal failure 4/22; patient presents for 1 week follow-up. The 2 areas on her right medial and right anterior lower leg appear well-healing. She has been using silver alginate to this area without issues. She has a first met head ulcer and it is unclear how long this has been there as it was discovered in the ED earlier this month when she was being evaluated for another issue. Iodoflex has been used at this area. 4/29; patient presents for 1 week follow-up. She has been using silver alginate to the leg wounds and Iodoflex to the plantar foot wound. She has had this wrapped with Coban and Kerlix. She has no concerns or complaints today. 5/6; this is a difficult wound on the right plantar foot transmit site. We are not making a lot of progress. She had 2 more venous looking wounds on the right medial and right anterior lower leg 1 of which is healed. Her appointment with Dr. Gwenlyn Price is on 5/25 Objective Constitutional Sitting or standing Blood Pressure is within target range for patient.. Pulse regular and within target range for patient.[name] Respirations regular, non-labored and within target range.. Temperature is normal and within the target range for the patient.[name] Appears in no distress. Vitals Time Taken: 2:58 PM, Temperature: 97.9 F, Pulse: 61 bpm, Respiratory Rate: 20 breaths/min, Blood Pressure: 104/54 mmHg, Capillary Blood Glucose: 222 mg/dl. General Notes: Wound exam; right anterior medial lower leg wounds are just about closed 1 is closed the other is just about closed. Again the area on the met head really does not have a viable surface. I debrided this today but if this does not help were going to have to reach out to Dr. Gwenlyn Price I will. Also removed some of the callus from around the circumference Integumentary (Hair, Skin) Wound #16 status is Open. Original cause of wound was Gradually  Appeared. The date acquired was: 02/05/2021. The wound has been in treatment 4 weeks. The wound is located on the Right,Plantar Amputation Site - Transmetatarsal. The wound measures 1.1cm length x 1.3cm width x 0.4cm depth; 1.123cm^2 area and 0.449cm^3 volume. There is Fat Layer (Subcutaneous Tissue) exposed. There is no tunneling noted, however, there is undermining starting at 1:00 and ending at 5:00 with a maximum distance of 0.4cm. There is a medium amount of serosanguineous drainage noted. The wound margin is thickened. There is small (1-33%) pink, pale granulation within  the wound bed. There is a large (67-100%) amount of necrotic tissue within the wound bed including Adherent Slough. Wound #17 status is Healed - Epithelialized. Original cause of wound was Blister. The date acquired was: 02/05/2021. The wound has been in treatment 4 weeks. The wound is located on the Right,Medial Lower Leg. The wound measures 0cm length x 0cm width x 0cm depth; 0cm^2 area and 0cm^3 volume. There is no tunneling or undermining noted. There is a none present amount of drainage noted. There is no granulation within the wound bed. There is no necrotic tissue within the wound bed. Wound #18 status is Open. Original cause of wound was Trauma. The date acquired was: 02/05/2021. The wound has been in treatment 4 weeks. The wound is located on the Right,Anterior Lower Leg. The wound measures 0.2cm length x 0.5cm width x 0.1cm depth; 0.079cm^2 area and 0.008cm^3 volume. There is Fat Layer (Subcutaneous Tissue) exposed. There is no tunneling or undermining noted. There is a small amount of serosanguineous drainage noted. The wound margin is distinct with the outline attached to the wound base. There is large (67-100%) pink granulation within the wound bed. There is no necrotic tissue within the wound bed. 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 There was a Excisional Skin/Subcutaneous Tissue Debridement with a total area of 1.43 sq cm performed by Ricard Dillon., MD. With the following instrument(s): Curette to remove Non-Viable tissue/material. Material removed includes Subcutaneous Tissue and Slough and after achieving pain control using Lidocaine 4% T opical Solution. No specimens were taken. A time out was conducted at 15:37, prior to the start of the procedure. A Minimum amount of bleeding was controlled with Pressure. The procedure was tolerated well. Post Debridement Measurements: 1.1cm length x 1.3cm width x 0.4cm depth; 0.449cm^3 volume. Character of Wound/Ulcer Post Debridement is stable. Severity of Tissue Post Debridement is: Fat layer exposed. Post procedure Diagnosis Wound #16: Same as Pre-Procedure Plan Follow-up Appointments: Return Appointment in 1 week. - with Dr. Arcola Jansky Shower/ Hygiene: May shower with protection but do not get wound dressing(s) wet. Edema Control - Lymphedema / SCD / Other: Elevate legs to the level of the heart or above for 30 minutes daily and/or when sitting, a frequency of: Avoid standing for long periods of time. Exercise regularly Off-Loading: Open toe surgical shoe to: - with felt padding to offload Additional Orders / Instructions: Stop/Decrease Smoking Follow Nutritious Diet WOUND #16: - Amputation Site - Transmetatarsal Wound Laterality: Plantar, Right Cleanser: Soap and Water 1 x Per Week/15 Days Discharge Instructions: May shower and wash  wound with dial antibacterial soap and water prior to dressing change. Prim Dressing: IODOFLEX 0.9% Cadexomer Iodine Pad 4x6 cm 1 x Per Week/15 Days ary Discharge Instructions: Apply to wound bed as instructed Secondary Dressing: Woven Gauze Sponge, Non-Sterile 4x4 in 1 x Per Week/15 Days Discharge Instructions: Apply over primary dressing as directed. Com pression Wrap: Kerlix Roll 4.5x3.1 (in/yd) 1 x Per Week/15 Days Discharge Instructions: Apply Kerlix and Coban compression as directed. Com pression Wrap: Coban Self-Adherent Wrap  4x5 (in/yd) 1 x Per Week/15 Days Discharge Instructions: Apply over Kerlix as directed. WOUND #18: - Lower Leg Wound Laterality: Right, Anterior Cleanser: Soap and Water 1 x Per Week/ Discharge Instructions: May shower and wash wound with dial antibacterial soap and water prior to dressing change. Prim Dressing: KerraCel Ag Gelling Fiber Dressing, 4x5 in (silver alginate) 1 x Per Week/ ary Discharge Instructions: Apply silver alginate to wound bed as instructed Secondary Dressing: Woven Gauze Sponge, Non-Sterile 4x4 in 1 x Per Week/ Discharge Instructions: Apply over primary dressing as directed. Secondary Dressing: ABD Pad, 8x10 1 x Per Week/ Discharge Instructions: Apply over primary dressing as directed. Com pression Wrap: Kerlix Roll 4.5x3.1 (in/yd) 1 x Per Week/ Discharge Instructions: Apply Kerlix and Coban compression as directed. Com pression Wrap: Coban Self-Adherent Wrap 4x5 (in/yd) 1 x Per Week/ Discharge Instructions: Apply over Kerlix as directed. #1 still using the Iodoflex on the foot and silver alginate on the leg 2. One of our nurses suggested a small forefoot offloading boot which we happened to have. I think this is a good suggestion 3. I think the nonhealing area on the right foot may be more vascular than anything else. She sees Dr. Gwenlyn Price in routine follow-up on the 25th I will text him and see if we can move this up Addendum; 5/9; I was  able to determine that I already texted Dr. Gwenlyn Price on 4/18 and he said that he would be glad to see her. Nothing is really changed since then. She is having pain but some of this may be neuropathic. I will wait to see what the appointment of the 25th brings. Electronic Signature(s) Signed: 03/22/2021 7:43:15 AM By: Linton Ham MD Previous Signature: 03/19/2021 4:39:58 PM Version By: Linton Ham MD Entered By: Linton Ham on 03/22/2021 07:43:15 -------------------------------------------------------------------------------- SuperBill Details Patient Name: Date of Service: Karla Corwin, [name] 03/19/2021 Medical Record Number: [id number] Patient Account Number: [id number] Date of Birth/Sex: Treating RN: [birth year] (60 y.o. [name] Primary Care Provider: SYSTEM, [facility] Other Clinician: Referring Provider: Treating Provider/Extender: [name]: 4 Diagnosis Coding ICD-10 Codes Code Description 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.518 Non-pressure chronic ulcer of other part of right foot with other specified severity E11.51 Type 2 diabetes mellitus with diabetic peripheral angiopathy without gangrene L97.811 Non-pressure chronic ulcer of other part of right lower leg limited to breakdown of skin Facility Procedures CPT4 Code: [id number] Description: 38329 - DEB SUBQ TISSUE 20 SQ CM/< ICD-10 Diagnosis Description 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 2 diabetes mellitus with foot ulcer Modifier: Quantity: 1 Physician Procedures : CPT4 Code Description Modifier [id number] - WC PHYS SUBQ TISS 20 SQ CM ICD-10 Diagnosis Description 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 2 diabetes mellitus with foot ulcer Quantity: 1 Electronic Signature(s) Signed: 03/19/2021 4:39:58 PM By: Linton Ham MD Entered By: Linton Ham on 03/19/2021 15:59:42

## 2021-03-19 NOTE — Progress Notes (Signed)
**Note Karla-Identified via Obfuscation** [name], HARRI ([id number]) Visit Report for 03/05/2021 Arrival Information Details Patient Name: Date of Service: [name] 03/05/2021 10:15 A M Medical Record Number: [id number] Patient Account Number: [id number] Date of Birth/Sex: Treating RN: 27-Jan-1961 (60 y.o. [name], Lauren Primary Care Aliahna Statzer: SYSTEM, PRO V IDER Other Clinician: Referring Jamone Garrido: Treating Jahi Roza/Extender: Yaakov Guthrie in Treatment: 2 Visit Information History Since Last Visit Added or deleted any medications: No Patient Arrived: Wheel Chair Any new allergies or adverse reactions: No Arrival Time: 10:47 Had a fall or experienced change in No Accompanied By: self activities of daily living that may affect Transfer Assistance: None risk of falls: Patient Identification Verified: Yes Signs or symptoms of abuse/neglect since last visito No Secondary Verification Process Completed: Yes Hospitalized since last visit: No Patient Requires Transmission-Based Precautions: No Implantable device outside of the clinic excluding No Patient Has Alerts: No cellular tissue based products placed in the center since last visit: Has Dressing in Place as Prescribed: Yes Pain Present Now: Yes Electronic Signature(s) Signed: 03/19/2021 3:14:58 PM By: [name] RN Entered By: [name] on 03/05/2021 10:47:37 -------------------------------------------------------------------------------- Encounter Discharge Information Details Patient Name: Date of Service: [name], [name] 03/05/2021 10:15 A M Medical Record Number: [id number] Patient Account Number: [id number] Date of Birth/Sex: Treating RN: [birth year] (60 y.o. [name] Primary Care Cary Wilford: SYSTEM, PRO Ave Filter Other Clinician: Referring Erricka Falkner: Treating Laiklyn Pilkenton/Extender: Yaakov Guthrie in Treatment: 2 Encounter Discharge Information Items Post Procedure Vitals Discharge Condition: Stable Temperature (F):  99 Ambulatory Status: Wheelchair Pulse (bpm): 74 Discharge Destination: Home Respiratory Rate (breaths/min): 18 Transportation: Private Auto Blood Pressure (mmHg): 145/70 Accompanied By: self Schedule Follow-up Appointment: Yes Clinical Summary of Care: Patient Declined Electronic Signature(s) Signed: 03/05/2021 5:10:31 PM By: Baruch Gouty RN, BSN Entered By: Baruch Gouty on 03/05/2021 12:01:46 -------------------------------------------------------------------------------- Lower Extremity Assessment Details Patient Name: Date of Service: Durward Parcel S. 03/05/2021 10:15 A M Medical Record Number: [id number] Patient Account Number: [id number] Date of Birth/Sex: Treating RN: [birth year] (60 y.o. [name] Primary Care Adrain Nesbit: SYSTEM, PRO V IDER Other Clinician: Referring Kalesha Irving: Treating Shatera Rennert/Extender: Yaakov Guthrie in Treatment: 2 Edema Assessment Assessed: [Left: No] [Right: Yes] Edema: [Left: Ye] [Right: s] Calf Left: Right: Point of Measurement: From Medial Instep 40 cm Ankle Left: Right: Point of Measurement: From Medial Instep 21.5 cm Vascular Assessment Pulses: Dorsalis Pedis Palpable: [Right:Yes] Posterior Tibial Palpable: [Right:Yes] Electronic Signature(s) Signed: 03/19/2021 3:14:58 PM By: [name] RN Entered By: [name] on 03/05/2021 10:59:33 -------------------------------------------------------------------------------- Multi Wound Chart Details Patient Name: Date of Service: [name], [name] 03/05/2021 10:15 A M Medical Record Number: [id number] Patient Account Number: [id number] Date of Birth/Sex: Treating RN: [birth year] (60 y.o. [name] Primary Care Ronnae Kaser: SYSTEM, PRO V IDER Other Clinician: Referring Symone Cornman: Treating Jeanae Whitmill/Extender: Yaakov Guthrie in Treatment: 2 Vital Signs Height(in): Capillary Blood Glucose(mg/dl): 224 Weight(lbs): Pulse(bpm): 27 Body Mass  Index(BMI): Blood Pressure(mmHg): 145/70 Temperature(F): 99 Respiratory Rate(breaths/min): 17 Photos: [16:No Photos Right, Plantar Amputation Site -] [17:No Photos Right, Medial Lower Leg] [18:No Photos Right, Anterior Lower Leg] Wound Location: [16:Transmetatarsal Gradually Appeared] [17:Blister] [18:Trauma] Wounding Event: [16:Diabetic Wound/Ulcer of the Lower] [17:Diabetic Wound/Ulcer of the Lower] [18:Diabetic Wound/Ulcer of the Lower] Primary Etiology: [16:Extremity Chronic Obstructive Pulmonary] [17:Extremity Chronic Obstructive Pulmonary] [18:Extremity Chronic Obstructive Pulmonary] Comorbid History: [16:Disease (COPD), Sleep Apnea, Arrhythmia, Congestive Heart Failure, Coronary Artery Disease, Hypertension, Peripheral Arterial Disease, Cirrhosis , Type II Diabetes, Osteoarthritis, Neuropathy 02/05/2021] [17:Disease (COPD), Sleep  Apnea,  Arrhythmia, Congestive Heart Failure, Arrhythmia, Congestive Heart Failure, Coronary Artery Disease, Hypertension, Peripheral Arterial Disease, Cirrhosis , Type II Diabetes, Disease, Cirrhosis , Type II Diabetes, Osteoarthritis, Neuropathy  02/05/2021] [18:Disease (COPD), Sleep Apnea, Coronary Artery Disease, Hypertension, Peripheral Arterial Osteoarthritis, Neuropathy 02/05/2021] Date Acquired: [16:2] [17:2] [18:2] Weeks of Treatment: [16:Open] [17:Open] [18:Open] Wound Status: [16:1.9x1.4x0.5] [17:4x3.5x0.1] [18:0.5x4x0.2] Measurements L x W x D (cm) [16:2.089] [17:10.996] [18:1.571] A (cm) : rea [16:1.045] [17:1.1] [18:0.314] Volume (cm) : [16:-27.80%] [17:60.20%] [18:94.30%] % Reduction in A [16:rea: -113.30%] [17:60.20%] [18:88.60%] % Reduction in Volume: [16:Grade 2] [17:Grade 2] [18:Grade 2] Classification: [16:Medium] [17:Medium] [18:Medium] Exudate A mount: [16:Serosanguineous] [17:Serosanguineous] [18:Serosanguineous] Exudate Type: [16:red, brown] [17:red, brown] [18:red, brown] Exudate Color: [16:Distinct, outline attached] [17:Distinct,  outline attached] [18:Distinct, outline attached] Wound Margin: [16:Small (1-33%)] [17:Large (67-100%)] [18:Small (1-33%)] Granulation A mount: [16:Red, Pink] [17:Red, Pink] [18:Red, Pink] Granulation Quality: [16:Large (67-100%)] [17:Small (1-33%)] [18:Large (67-100%)] Necrotic A mount: [16:Fascia: No] [17:Fat Layer (Subcutaneous Tissue): Yes Fat Layer (Subcutaneous Tissue): Yes] Exposed Structures: [16:Fat Layer (Subcutaneous Tissue): No Tendon: No Muscle: No Joint: No Bone: No Small (1-33%)] [17:Fascia: No Tendon: No Muscle: No Joint: No Bone: No Small (1-33%)] [18:Fascia: No Tendon: No Muscle: No Joint: No Bone: No Medium (34-66%)] Epithelialization: [16:Debridement - Excisional] [17:N/A] [18:N/A] Debridement: Pre-procedure Verification/Time Out 11:20 [17:N/A] [18:N/A] Taken: [16:Other] [17:N/A] [18:N/A] Pain Control: [16:Subcutaneous] [17:N/A] [18:N/A] Tissue Debrided: [16:Skin/Subcutaneous Tissue] [17:N/A] [18:N/A] Level: [16:2.66] [17:N/A] [18:N/A] Debridement A (sq cm): [16:rea Curette] [17:N/A] [18:N/A] Instrument: [16:Minimum] [17:N/A] [18:N/A] Bleeding: [16:Pressure] [17:N/A] [18:N/A] Hemostasis A chieved: [16:Procedure was tolerated well] [17:N/A] [18:N/A] Debridement Treatment Response: [16:1.9x1.4x0.5] [17:N/A] [18:N/A] Post Debridement Measurements L x W x D (cm) [16:1.045] [17:N/A] [18:N/A] Post Debridement Volume: (cm) [16:Debridement] [17:N/A] [18:N/A] Treatment Notes 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 Peri-Wound Care Topical Primary Dressing IODOFLEX 0.9% Cadexomer Iodine Pad 4x6 cm Discharge Instruction: Apply to wound bed as instructed Secondary Dressing Woven Gauze Sponge, Non-Sterile 4x4 in Discharge Instruction: Apply over primary dressing as directed. Secured  With Compression Wrap Kerlix Roll 4.5x3.1 (in/yd) Discharge Instruction: Apply Kerlix and Coban compression as directed. Coban Self-Adherent Wrap 4x5 (in/yd) Discharge Instruction: Apply over Kerlix as directed. Compression Stockings Add-Ons Wound #17 (Lower Leg) Wound Laterality: Right, Medial Cleanser Soap and Water Discharge Instruction: May shower and wash wound with dial antibacterial soap and water prior to dressing change. Peri-Wound Care Topical Primary Dressing KerraCel Ag Gelling Fiber Dressing, 4x5 in (silver alginate) Discharge Instruction: Apply silver alginate to wound bed as instructed Secondary Dressing Woven Gauze Sponge, Non-Sterile 4x4 in Discharge Instruction: Apply over primary dressing as directed. ABD Pad, 8x10 Discharge Instruction: Apply over primary dressing as directed. Secured With Compression Wrap Kerlix Roll 4.5x3.1 (in/yd) Discharge Instruction: Apply Kerlix and Coban compression as directed. Coban Self-Adherent Wrap 4x5 (in/yd) Discharge Instruction: Apply over Kerlix as directed. Compression Stockings Add-Ons Wound #18 (Lower Leg) Wound Laterality: Right, Anterior Cleanser Soap and Water Discharge Instruction: May shower and wash wound with dial antibacterial soap and water prior to dressing change. Peri-Wound Care Topical Primary Dressing KerraCel Ag Gelling Fiber Dressing, 4x5 in (silver alginate) Discharge Instruction: Apply silver alginate to wound bed as instructed Secondary Dressing Woven Gauze Sponge, Non-Sterile 4x4 in Discharge Instruction: Apply over primary dressing as directed. ABD Pad, 8x10 Discharge Instruction: Apply over primary dressing as directed. Secured With Compression Wrap Kerlix Roll 4.5x3.1 (in/yd) Discharge Instruction: Apply Kerlix and  Coban compression as directed. Coban Self-Adherent Wrap 4x5 (in/yd) Discharge Instruction: Apply over Kerlix as directed. Compression Stockings Add-Ons Electronic  Signature(s) Signed: 03/05/2021 12:28:26 PM By: [name] DO Signed: 03/05/2021 5:01:44 PM By: Fara Chute By: [name] on 03/05/2021 12:17:53 -------------------------------------------------------------------------------- Multi-Disciplinary Care Plan Details Patient Name: Date of Service: [name], [name] 03/05/2021 10:15 A M Medical Record Number: [id number] Patient Account Number: [id number] Date of Birth/Sex: Treating RN: Nov 26, 1960 (60 y.o. [name] Primary Care Jenny Lai: SYSTEM, PRO Ave Filter Other Clinician: Referring Tavita Eastham: Treating Zackarey Holleman/Extender: Yaakov Guthrie in Treatment: 2 Active Inactive Abuse / Safety / Falls / Self Care Management Nursing Diagnoses: History of Falls Goals: Patient will not experience any injury related to falls Date Initiated: 02/19/2021 Target Resolution Date: 03/26/2021 Goal Status: Active Interventions: Assess Activities of Daily Living upon admission and as needed Assess fall risk on admission and as needed Provide education on fall prevention Notes: Nutrition Nursing Diagnoses: Impaired glucose control: actual or potential Goals: Patient/caregiver verbalizes understanding of need to maintain therapeutic glucose control per primary care physician Date Initiated: 02/19/2021 Target Resolution Date: 03/26/2021 Goal Status: Active Interventions: Assess patient nutrition upon admission and as needed per policy Provide education on elevated blood sugars and impact on wound healing Provide education on nutrition Notes: Orientation to the Wound Care Program Nursing Diagnoses: Knowledge deficit related to the wound healing center program Goals: Patient/caregiver will verbalize understanding of the [facility] Date Initiated: 02/19/2021 Target Resolution Date: 03/19/2021 Goal Status: Active Interventions: Provide education on orientation to the wound center Notes: Wound/Skin  Impairment Nursing Diagnoses: Impaired tissue integrity Goals: Patient/caregiver will verbalize understanding of skin care regimen Date Initiated: 02/19/2021 Target Resolution Date: 03/19/2021 Goal Status: Active Ulcer/skin breakdown will have a volume reduction of 30% by week 4 Date Initiated: 02/19/2021 Target Resolution Date: 03/19/2021 Goal Status: Active Interventions: Assess patient/caregiver ability to obtain necessary supplies Assess patient/caregiver ability to perform ulcer/skin care regimen upon admission and as needed Assess ulceration(s) every visit Provide education on ulcer and skin care Treatment Activities: Skin care regimen initiated : 02/19/2021 Topical wound management initiated : 02/19/2021 Notes: Electronic Signature(s) Signed: 03/05/2021 11:10:05 AM By: Lorrin Jackson Entered By: Lorrin Jackson on 03/05/2021 11:10:04 -------------------------------------------------------------------------------- Pain Assessment Details Patient Name: Date of Service: Durward Parcel S. 03/05/2021 10:15 A M Medical Record Number: [id number] Patient Account Number: [id number] Date of Birth/Sex: Treating RN: [birth year] (60 y.o. [name] Primary Care Lucero Ide: SYSTEM, PRO V IDER Other Clinician: Referring Senya Hinzman: Treating Jashawn Floyd/Extender: Yaakov Guthrie in Treatment: 2 Active Problems Location of Pain Severity and Description of Pain Patient Has Paino Yes Site Locations Pain Location: Generalized Pain, Pain in Ulcers With Dressing Change: Yes Duration of the Pain. Constant / Intermittento Intermittent Rate the pain. Current Pain Level: 9 Worst Pain Level: 10 Least Pain Level: 0 Tolerable Pain Level: 9 Character of Pain Describe the Pain: Aching Pain Management and Medication Current Pain Management: Medication: Yes Cold Application: No Rest: Yes Massage: No Activity: No T.E.N.S.: No Heat Application: No Leg drop or elevation: No Is the Current  Pain Management Adequate: Adequate How does your wound impact your activities of daily livingo Sleep: No Bathing: No Appetite: No Relationship With Others: No Bladder Continence: No Emotions: No Bowel Continence: No Work: No Toileting: No Drive: No Dressing: No Hobbies: No Electronic Signature(s) Signed: 03/19/2021 3:14:58 PM By: [name] RN Entered By: [name] on 03/05/2021 10:48:56 -------------------------------------------------------------------------------- Patient/Caregiver Education Details Patient Name: Date of Service:  HA LEY, [name] 4/22/2022andnbsp10:15 A M Medical Record Number: [id number] Patient Account Number: [id number] Date of Birth/Gender: Treating RN: [birth year] (60 y.o. [name] Primary Care Physician: SYSTEM, PRO V IDER Other Clinician: Referring Physician: Treating Physician/Extender: Yaakov Guthrie in Treatment: 2 Education Assessment Education Provided To: Patient Education Topics Provided Elevated Blood Sugar/ Impact on Healing: Methods: Explain/Verbal, Printed Responses: State content correctly [place]: o Methods: Explain/Verbal Responses: State content correctly Wound/Skin Impairment: Methods: Explain/Verbal, Printed Responses: State content correctly Electronic Signature(s) Signed: 03/05/2021 5:01:44 PM By: Lorrin Jackson Entered By: Lorrin Jackson on 03/05/2021 11:10:54 -------------------------------------------------------------------------------- Wound Assessment Details Patient Name: Date of Service: Durward Parcel S. 03/05/2021 10:15 A M Medical Record Number: [id number] Patient Account Number: [id number] Date of Birth/Sex: Treating RN: 1961-03-03 (60 y.o. [name], Lauren Primary Care Siler Mavis: SYSTEM, PRO V IDER Other Clinician: Referring Lisle Skillman: Treating Anisah Kuck/Extender: Yaakov Guthrie in Treatment: 2 Wound Status Wound Number: 16 Primary  Diabetic Wound/Ulcer of the Lower Extremity Etiology: Wound Location: Right, Plantar Amputation Site - Transmetatarsal Wound Open Wounding Event: Gradually Appeared Status: Date Acquired: 02/05/2021 Comorbid Chronic Obstructive Pulmonary Disease (COPD), Sleep Apnea, Weeks Of Treatment: 2 History: Arrhythmia, Congestive Heart Failure, Coronary Artery Disease, Clustered Wound: No Hypertension, Peripheral Arterial Disease, Cirrhosis , Type II Diabetes, Osteoarthritis, Neuropathy Photos Wound Measurements Length: (cm) 1.9 Width: (cm) 1.4 Depth: (cm) 0.5 Area: (cm) 2.089 Volume: (cm) 1.045 % Reduction in Area: -27.8% % Reduction in Volume: -113.3% Epithelialization: Small (1-33%) Tunneling: No Undermining: No Wound Description Classification: Grade 2 Wound Margin: Distinct, outline attached Exudate Amount: Medium Exudate Type: Serosanguineous Exudate Color: red, brown Foul Odor After Cleansing: No Slough/Fibrino Yes Wound Bed Granulation Amount: Small (1-33%) Exposed Structure Granulation Quality: Red, Pink Fascia Exposed: No Necrotic Amount: Large (67-100%) Fat Layer (Subcutaneous Tissue) Exposed: No Necrotic Quality: Adherent Slough Tendon Exposed: No Muscle Exposed: No Joint Exposed: No Bone Exposed: No Electronic Signature(s) Signed: 03/09/2021 2:09:33 PM By: Sandre Kitty Signed: 03/19/2021 3:14:58 PM By: [name] RN Entered By: Sandre Kitty on 03/05/2021 16:30:02 -------------------------------------------------------------------------------- Wound Assessment Details Patient Name: Date of Service: Durward Parcel S. 03/05/2021 10:15 A M Medical Record Number: [id number] Patient Account Number: [id number] Date of Birth/Sex: Treating RN: 08-15-1961 (60 y.o. [name], Lauren Primary Care Laquashia Mergenthaler: SYSTEM, PRO V IDER Other Clinician: Referring Cyanna Neace: Treating Areta Terwilliger/Extender: Yaakov Guthrie in Treatment: 2 Wound Status Wound  Number: 17 Primary Diabetic Wound/Ulcer of the Lower Extremity Etiology: Wound Location: Right, Medial Lower Leg Wound Open Wounding Event: Blister Status: Date Acquired: 02/05/2021 Comorbid Chronic Obstructive Pulmonary Disease (COPD), Sleep Apnea, Weeks Of Treatment: 2 History: Arrhythmia, Congestive Heart Failure, Coronary Artery Disease, Clustered Wound: No Hypertension, Peripheral Arterial Disease, Cirrhosis , Type II Diabetes, Osteoarthritis, Neuropathy Photos Wound Measurements Length: (cm) 4 Width: (cm) 3.5 Depth: (cm) 0.1 Area: (cm) 10.996 Volume: (cm) 1.1 % Reduction in Area: 60.2% % Reduction in Volume: 60.2% Epithelialization: Small (1-33%) Tunneling: No Undermining: No Wound Description Classification: Grade 2 Wound Margin: Distinct, outline attached Exudate Amount: Medium Exudate Type: Serosanguineous Exudate Color: red, brown Foul Odor After Cleansing: No Slough/Fibrino Yes Wound Bed Granulation Amount: Large (67-100%) Exposed Structure Granulation Quality: Red, Pink Fascia Exposed: No Necrotic Amount: Small (1-33%) Fat Layer (Subcutaneous Tissue) Exposed: Yes Necrotic Quality: Adherent Slough Tendon Exposed: No Muscle Exposed: No Joint Exposed: No Bone Exposed: No Electronic Signature(s) Signed: 03/09/2021 2:09:33 PM By: Sandre Kitty Signed: 03/19/2021 3:14:58 PM By: [name] RN Entered By: Sandre Kitty on 03/05/2021  16:29:40 -------------------------------------------------------------------------------- Wound Assessment Details Patient Name: Date of Service: [name] 03/05/2021 10:15 A M Medical Record Number: [id number] Patient Account Number: [id number] Date of Birth/Sex: Treating RN: [birth year] (60 y.o. [name], Lauren Primary Care Edlin Ford: SYSTEM, PRO V IDER Other Clinician: Referring Kionte Baumgardner: Treating Rachele Lamaster/Extender: Yaakov Guthrie in Treatment: 2 Wound Status Wound Number: 18 Primary Diabetic  Wound/Ulcer of the Lower Extremity Etiology: Wound Location: Right, Anterior Lower Leg Wound Open Wounding Event: Trauma Status: Date Acquired: 02/05/2021 Comorbid Chronic Obstructive Pulmonary Disease (COPD), Sleep Apnea, Weeks Of Treatment: 2 History: Arrhythmia, Congestive Heart Failure, Coronary Artery Disease, Clustered Wound: No Hypertension, Peripheral Arterial Disease, Cirrhosis , Type II Diabetes, Osteoarthritis, Neuropathy Photos Wound Measurements Length: (cm) 0.5 Width: (cm) 4 Depth: (cm) 0.2 Area: (cm) 1.571 Volume: (cm) 0.314 % Reduction in Area: 94.3% % Reduction in Volume: 88.6% Epithelialization: Medium (34-66%) Tunneling: No Undermining: No Wound Description Classification: Grade 2 Wound Margin: Distinct, outline attached Exudate Amount: Medium Exudate Type: Serosanguineous Exudate Color: red, brown Foul Odor After Cleansing: No Slough/Fibrino Yes Wound Bed Granulation Amount: Small (1-33%) Exposed Structure Granulation Quality: Red, Pink Fascia Exposed: No Necrotic Amount: Large (67-100%) Fat Layer (Subcutaneous Tissue) Exposed: Yes Necrotic Quality: Adherent Slough Tendon Exposed: No Muscle Exposed: No Joint Exposed: No Bone Exposed: No Electronic Signature(s) Signed: 03/09/2021 2:09:33 PM By: Sandre Kitty Signed: 03/19/2021 3:14:58 PM By: [name] RN Entered By: Sandre Kitty on 03/05/2021 16:29:20 -------------------------------------------------------------------------------- Vitals Details Patient Name: Date of Service: [name], [name] 03/05/2021 10:15 A M Medical Record Number: [id number] Patient Account Number: [id number] Date of Birth/Sex: Treating RN: 1961/07/01 (60 y.o. [name], Lauren Primary Care Starlit Raburn: SYSTEM, PRO V IDER Other Clinician: Referring Masoud Nyce: Treating Tj Kitchings/Extender: Yaakov Guthrie in Treatment: 2 Vital Signs Time Taken: 10:48 Temperature (F): 99 Pulse (bpm):  74 Respiratory Rate (breaths/min): 17 Blood Pressure (mmHg): 145/70 Capillary Blood Glucose (mg/dl): 224 Reference Range: 80 - 120 mg / dl Electronic Signature(s) Signed: 03/19/2021 3:14:58 PM By: [name] RN Entered By: [name] on 03/05/2021 10:48:27

## 2021-03-23 ENCOUNTER — Telehealth (HOSPITAL_COMMUNITY): Payer: Self-pay | Admitting: Adult Health

## 2021-03-23 NOTE — Telephone Encounter (Signed)
   [facility] Reading

## 2021-03-23 NOTE — Telephone Encounter (Signed)
   [facility] Goal 21--> 32 today   PA Systolic PA Mean PA Diastolic Heart Rate  Sensor:    60 mmHg 44 mmHg 32 mmHg 81 bpm   Discussed. She had tacos last night.   Discussed low salt food choices and instructed to increase torsemide to 80 mg twice a day.   She has follow up on 03/25/21 in the [facility].   Guerry Covington  Np-C  4:26 PM

## 2021-03-23 NOTE — Telephone Encounter (Signed)
Error see phone note on 03/23/21   Shelia Kingsberry NP=C  4:54 PM

## 2021-03-25 ENCOUNTER — Encounter (HOSPITAL_COMMUNITY): Payer: Self-pay

## 2021-03-25 ENCOUNTER — Other Ambulatory Visit: Payer: Self-pay

## 2021-03-25 ENCOUNTER — Ambulatory Visit (HOSPITAL_COMMUNITY)
Admission: RE | Admit: 2021-03-25 | Discharge: 2021-03-25 | Disposition: A | Discharge status: Home / Self Care | Payer: Medicare Other | Source: Ambulatory Visit | Attending: Cardiology | Admitting: Cardiology

## 2021-03-25 VITALS — BP 136/80 | HR 67 | Wt 225.6 lb

## 2021-03-25 DIAGNOSIS — E1151 Type 2 diabetes mellitus with diabetic peripheral angiopathy without gangrene: Secondary | ICD-10-CM | POA: Insufficient documentation

## 2021-03-25 DIAGNOSIS — Z7951 Long term (current) use of inhaled steroids: Secondary | ICD-10-CM | POA: Insufficient documentation

## 2021-03-25 DIAGNOSIS — I480 Paroxysmal atrial fibrillation: Secondary | ICD-10-CM | POA: Diagnosis not present

## 2021-03-25 DIAGNOSIS — Z79890 Hormone replacement therapy (postmenopausal): Secondary | ICD-10-CM | POA: Diagnosis not present

## 2021-03-25 DIAGNOSIS — Z7984 Long term (current) use of oral hypoglycemic drugs: Secondary | ICD-10-CM | POA: Insufficient documentation

## 2021-03-25 DIAGNOSIS — I5033 Acute on chronic diastolic (congestive) heart failure: Secondary | ICD-10-CM

## 2021-03-25 DIAGNOSIS — I5032 Chronic diastolic (congestive) heart failure: Secondary | ICD-10-CM

## 2021-03-25 DIAGNOSIS — Z87891 Personal history of nicotine dependence: Secondary | ICD-10-CM | POA: Insufficient documentation

## 2021-03-25 DIAGNOSIS — Z7901 Long term (current) use of anticoagulants: Secondary | ICD-10-CM | POA: Diagnosis not present

## 2021-03-25 DIAGNOSIS — I130 Hypertensive heart and chronic kidney disease with heart failure and stage 1 through stage 4 chronic kidney disease, or unspecified chronic kidney disease: Secondary | ICD-10-CM | POA: Insufficient documentation

## 2021-03-25 DIAGNOSIS — Z79899 Other long term (current) drug therapy: Secondary | ICD-10-CM | POA: Insufficient documentation

## 2021-03-25 DIAGNOSIS — I5042 Chronic combined systolic (congestive) and diastolic (congestive) heart failure: Secondary | ICD-10-CM | POA: Diagnosis present

## 2021-03-25 DIAGNOSIS — J449 Chronic obstructive pulmonary disease, unspecified: Secondary | ICD-10-CM | POA: Insufficient documentation

## 2021-03-25 DIAGNOSIS — G4733 Obstructive sleep apnea (adult) (pediatric): Secondary | ICD-10-CM | POA: Diagnosis not present

## 2021-03-25 DIAGNOSIS — E11621 Type 2 diabetes mellitus with foot ulcer: Secondary | ICD-10-CM | POA: Diagnosis not present

## 2021-03-25 DIAGNOSIS — E039 Hypothyroidism, unspecified: Secondary | ICD-10-CM | POA: Insufficient documentation

## 2021-03-25 DIAGNOSIS — Z7902 Long term (current) use of antithrombotics/antiplatelets: Secondary | ICD-10-CM | POA: Insufficient documentation

## 2021-03-25 DIAGNOSIS — K7460 Unspecified cirrhosis of liver: Secondary | ICD-10-CM | POA: Insufficient documentation

## 2021-03-25 DIAGNOSIS — Z794 Long term (current) use of insulin: Secondary | ICD-10-CM | POA: Insufficient documentation

## 2021-03-25 DIAGNOSIS — N183 Chronic kidney disease, stage 3 unspecified: Secondary | ICD-10-CM | POA: Diagnosis not present

## 2021-03-25 DIAGNOSIS — I2510 Atherosclerotic heart disease of native coronary artery without angina pectoris: Secondary | ICD-10-CM | POA: Diagnosis not present

## 2021-03-25 DIAGNOSIS — E1122 Type 2 diabetes mellitus with diabetic chronic kidney disease: Secondary | ICD-10-CM | POA: Insufficient documentation

## 2021-03-25 DIAGNOSIS — L97519 Non-pressure chronic ulcer of other part of right foot with unspecified severity: Secondary | ICD-10-CM | POA: Diagnosis not present

## 2021-03-25 NOTE — Patient Instructions (Signed)
Continue Demadex (Torsemide) 80 mg twice daily Paramedics will come out to see you again.  Labwork today and in 7days-We will call with any abnormal values

## 2021-03-25 NOTE — Progress Notes (Signed)
Patient ID: [name], female   DOB: [birth year], 60 y.o.   MRN: [id number]    Advanced Heart Failure Clinic Note   PCP: Dr Roxy Manns HF Cardiology: Dr. Aundra Dubin PV: Dr. Gwenlyn Found  [name] is a 60 y.o. with history of PAD, carotid stenosis s/p left CEA, relatively mild CAD, chronic systolic CHF, paroxysmal atrial fibrillation and prior substance abuse presents for CHF clinic followup.  She was admitted in 1/17 with acute hypoxemic respiratory failure in the setting of atrial fibrillation/RVR and volume overload.  She was initially intubated.  She converted back to NSR with amiodarone gtt.  She was treated with IV Lasix, steroids, bronchodilators.  She developed AKI and losartan was stopped.    She was admitted in 3/17 with symptomatic bradycardia.  She was supposed to stop diltiazem after this admission but continued to take it.  She presented later in 3/17, again with symptomatic bradycardia (junctional bradycardia), hypotension, and AKI.  Diltiazem and bisoprolol were stopped.  HR recovered and she has had no problems since.  ACEI was stopped with AKI.   In 5/18, she had peripheral angiography with atherectomy of right SFA.  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 7/18 ABIs showed 0.65 on right, 0.31 on left.  In 8/20, she had peripheral angiography showing totally occluded left SFA.  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 Donnetta Hutching.  ABIs in 2/21 were 0.44 on right, 0.99 on left.   Echo in 1/21 showed EF 50%, mild LVH, normal RV, PASP 60 mmHg.   Echo 03/08/21 EF 55-60% with basal to mid inferolateral hypokinesis.   Seen by Dr. Aundra Dubin last month for f/u and was fluid overloaded. Instructed to increase torsemide to 80 qam/60 qpm.  She presents to clinic today for f/u. Of note, based on Cardiomems reading yesterday, she was advised to increase torsemide further to 80 mg bid as dPAP was elevated at 33 (Goal 21).   She remains  fluid overloaded today. Admits to dietary indiscretion w/ sodium. She does not walk much post right transmetatarsal amputation.  She has been told to stay off her right foot as much as possible for now (has a blister on the foot being treated at the wound center). Thus hard to quantify functional status but she denies resting dyspnea. BP ok. Denies CP. Did not submit CardioMemes reading today.   I worry about poor health literacy and her med compliance. Seems confused on what to take.  We discussed referral back to paramedicine and she would be ok with that.     Cardiomems: Last reading from 5/11 showed PADP 33 (goal 21)  Labs (1/17): K 5.2, creatinine 1.4, AST 43, ALT normal Labs (2/17): K 4.2, creatinine 1.3, BNP 607, TSH normal Labs (3/17): K 4.2, creatinine 1.45, AST/ALT normal, HCT 28.7 Labs (4/17): K 4.4, creatinine 1.61 Labs (08/31/2016): K 4.5 Creatinine 1.43  Labs (11/17): K 4.2, creatinine 1.4 Labs (9/18): K 4.1, creatinine 1.1, TSH normal Labs 04/13/2018: K 5 Creatinine 1.75 Labs (2/21): K 4.1, creatinine 1.58 Labs (4/22): K 3.7, creatinine 1.55  PMH: 1. Carotid stenosis: Known occluded right carotid.  Left CEA in 4/16.  - Carotid dopplers (7/21): CTO RICA, mild disease LICA.  2. CAD: LHC in 12/15 with 80% stenosis in small OM1, nonobstructive disease in other territories.  3. Chronic systolic CHF: Nonischemic cardiomyopathy (?due to ETOH or prior drug abuse).  She has a Cardiomems.  -  Echo (1/17) with EF 45%, mild LV hypertrophy, moderate diastolic dysfunction, inferolateral severe hypokinesis, mildly decreased RV systolic function.  - Echo (1/18): EF 40-45%, mild LVH, normal RV size and systolic function.  - Echo (1/21): EF 50%, mild LVH, normal RV, PASP 60 mmHg.  - Echo (4/22): EF 55-60%, basal-mid inferolateral hypokinesis with grade II diastolic dysfunction, RV normal, PASP 42 4. Atrial fibrillation: Paroxysmal.   5. Type II diabetes 6. CKD: Stage III. 7. COPD: Smokes 1  ppd.  8. 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  No longer drinks.  9. Hypothyroidism 10. PAD: Atherectomy SFA in 2014 (Dr Gwenlyn Found).  Peripheral arterial dopplers (2/17) with focal 75-99% proximal right SFA stenosis, occluded mid-distal right SFA, chronic occlusion of all runoff arteries on right. - In 5/18, she had peripheral angiography with atherectomy of right SFA.   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  - 7/18 ABIs showed 0.65 on right, 0.31 on left.   - Peripheral angiography 8/20: Totally occluded left SFA.  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 Donnetta Hutching.   - ABIs (2/21): 0.44 R, 0.99 L.  11. Anemia 12. Prior cocaine abuse.  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  14. OSA: Has not been able to tolerate CPAP.   SH: Lives with sister.  Prior cocaine abuse.  Prior ETOH abuse.  Quit smoking in 1/17, restarted, and quit again in 4/19, restarted again.  FH: CAD  Review of systems complete and found to be negative unless listed in HPI.    Current Outpatient Medications  Medication Sig Dispense Refill  . ACCU-CHEK GUIDE test strip USE TO CHECK BLOOD SUGAR FOUR TIMES DAILY    . albuterol (VENTOLIN HFA) 108 (90 Base) MCG/ACT inhaler Inhale 2 puffs into the lungs every 6 (six) hours as needed for wheezing or shortness of breath. 18 g 0  . amiodarone (PACERONE) 200 MG tablet Take 0.5 tablets (100 mg total) by mouth daily. 45 tablet 3  . amLODipine (NORVASC) 10 MG tablet Take 1 tablet (10 mg total) by mouth daily. 90 tablet 3  . apixaban (ELIQUIS) 5 MG TABS tablet TAKE 1 TABLET(5 MG) BY MOUTH TWICE DAILY 180 tablet 3  . atorvastatin (LIPITOR) 40 MG tablet Take 40 mg by mouth every evening.    . bisoprolol (ZEBETA) 5 MG tablet Take 1 tablet (5 mg total) by mouth daily. 90 tablet 3  . budesonide-formoterol (SYMBICORT) 160-4.5 MCG/ACT inhaler Inhale 2 puffs into the lungs 2 (two) times  daily. 1 Inhaler 2  . clopidogrel (PLAVIX) 75 MG tablet Take 75 mg by mouth daily.    . colchicine 0.6 MG tablet Take 0.6 mg by mouth daily.    . cyclobenzaprine (FLEXERIL) 10 MG tablet Take 1 tablet by mouth at bedtime.    . Dulaglutide (TRULICITY [place]) Inject [id number] mg into the skin once a week.    [name] FLUoxetine (PROZAC) 10 MG tablet Take 10 mg by mouth daily.    . folic acid (FOLVITE) 1 MG tablet Take 1 tablet (1 mg total) by mouth daily. 30 tablet 0  . hydrALAZINE (APRESOLINE) 25 MG tablet Take 1 tablet (25 mg total) by mouth 3 (three) times daily. 90 tablet 11  . insulin aspart (NOVOLOG) 100 UNIT/ML FlexPen INJECT 8 UNITS INTO THE SKIN THREE TIMES DAILY WITH MEALS. 15 mL 0  . insulin glargine (LANTUS) 100 UNIT/ML Solostar Pen INJECT 20 UNITS  INTO THE SKIN DAILY. 15 mL 0  . insulin lispro (HUMALOG) 100 UNIT/ML KwikPen Inject 8 Units into the skin 3 (three) times daily with meals. 15 mL 0  . Insulin Pen Needle 32G X 4 MM MISC USE AS DIRECTED WITH INSULIN PENS 100 each 0  . levothyroxine (SYNTHROID, LEVOTHROID) 50 MCG tablet Take 1 tablet (50 mcg total) by mouth daily. 30 tablet 0  . losartan (COZAAR) 100 MG tablet Take 1 tablet (100 mg total) by mouth daily. 90 tablet 3  . methocarbamol (ROBAXIN) 500 MG tablet Take 1 tablet (500 mg total) by mouth every 8 (eight) hours as needed for muscle spasms. 30 tablet 0  . Multiple Vitamins-Minerals (VITAMIN D3 COMPLETE PO) Take 1 capsule by mouth daily in the afternoon.    . nortriptyline (PAMELOR) 50 MG capsule Take 50 mg by mouth 2 (two) times daily.    [name] omega-3 fish oil (MAXEPA) 1000 MG CAPS capsule Take 1 capsule by mouth daily.    . pantoprazole (PROTONIX) 40 MG tablet Take 1 tablet (40 mg total) by mouth daily.    [name] torsemide (DEMADEX) 20 MG tablet Take 4 tablets (80 mg total) by mouth every morning AND 3 tablets (60 mg total) every evening. (Patient taking differently: Take 4 tablets (80 mg total) by mouth every morning AND 4 tablets (80 mg total)  every evening.) 210 tablet 11  . JARDIANCE 10 MG TABS tablet TAKE 1 TABLET BY MOUTH  DAILY BEFORE BREAKFAST 90 tablet 3   No current facility-administered medications for this encounter.   Vitals:   03/25/21 1532  BP: 136/80  Pulse: 67  SpO2: 95%  Weight: 102.3 kg     Wt Readings from Last 3 Encounters:  03/25/21 102.3 kg  02/23/21 106.6 kg  01/03/21 103.5 kg     PHYSICAL EXAM: General:  Chronically ill appearing, in wheel chair, looks much older than actual age. No respiratory difficulty HEENT: normal Neck: supple. JVD elevated to jaw. Carotids 2+ bilat; no bruits. No lymphadenopathy or thyromegaly appreciated. Cor: PMI nondisplaced. RRR. No rubs, gallops or murmurs. Lungs: clear Abdomen: soft, nontender, nondistended. No hepatosplenomegaly. No bruits or masses. Good bowel sounds. Extremities: no cyanosis, clubbing, rash, 1+ bilateral edema s/p rt transmetatarsal amputation  Neuro: alert & oriented x 3, cranial nerves grossly intact. moves all 4 extremities w/o difficulty. Affect pleasant.  Assessment/Plan: 1. Acute on Chronic diastolic CHF: LHC in [id number] showing only 80% stenosis in small OM1. EF as low as 40-45% in the past.Recent echo 4/22 showed EF 55-60% with grade 2 diastolic dysfunction.     She is volume overloaded today on exam with elevated PADP recently by Cardiomems.  Not very active, NYHA class II-III symptoms. Admits to dietary indiscretion w/ sodium and I worry about med compliance due to poor health literacy level.  - Continue on increased dose of torsemide '80mg'$  bid (increased yesterday) - Refer back to paramedicine to help w/ compliance - Advised to increase compliance rate w/ CardioMEMs to help closely monitor volume status - Advised low sodium diet + fluid restriction  - Continue bisoprolol 5 mg daily.  - Continue losartan 100 mg daily.  - Continue Jardiance 10 mg daily.  2. Atrial fibrillation: Paroxysmal.  - Continue amiodarone 100 mg daily. Recent HFTs/  TFTs 4/22 ok.  She is on levothyroxine, followed by PCP.  Needs regular eye exams. - Continue Eliquis 5 mg bid.   3. PAD: Claudication bilaterally.  Not candidate for cilostazol with CHF.  She has  had a left fem-pop bypass in 8/20.  ABIs in 2/21 were very abnormal on right.  She also has an ulceration on her right foot.  - At this point, she can stop Plavix as she is on Eliquis.   - Followup with Drs Gwenlyn Found and Early for PAD.  - Continue atorvastatin. LDL at goal on Lipid panel 4/22 at 64 mg/dL  - Continue with wound clinic.  4. COPD:  Continues to smoke, encouraged to quit.   5. H/o cirrhosis: Likely from ETOH, now abstinent.  6. OSA: Not able to tolerate CPAP.   7. CKD stage III:  Check BMP today and again in 7 days w/ diuretic dose increase 8. HTN: controlled on current regimen  9.Type 2 diabetes: Per PCP.   10. CAD: LHC in 12/15 with 80% stenosis in small OM1, nonobstructive disease in other territories. - Continue statin.  - Given Eliquis use, I think she can stop Plavix.   F/u w/ APP in 4 weeks to reassess volume status   Lyda Jester, PA-C  03/25/2021

## 2021-03-26 ENCOUNTER — Telehealth (HOSPITAL_COMMUNITY): Payer: Self-pay | Admitting: Licensed Clinical Social Worker

## 2021-03-26 ENCOUNTER — Encounter (HOSPITAL_BASED_OUTPATIENT_CLINIC_OR_DEPARTMENT_OTHER): Payer: Medicare Other | Admitting: Internal Medicine

## 2021-03-26 NOTE — Telephone Encounter (Signed)
CSW consulted to add pt to [employer] program for follow up regarding concerns with medication management.  Pt referral created and sent to paramedics for review and assignment.  Will continue to follow and assist as needed  [name], [facility] Desk#: [phone] Cell#: [phone]

## 2021-03-29 ENCOUNTER — Other Ambulatory Visit: Payer: Self-pay

## 2021-03-29 ENCOUNTER — Telehealth (HOSPITAL_COMMUNITY): Payer: Self-pay

## 2021-03-29 ENCOUNTER — Encounter (HOSPITAL_BASED_OUTPATIENT_CLINIC_OR_DEPARTMENT_OTHER): Payer: Medicare Other | Admitting: Internal Medicine

## 2021-03-29 DIAGNOSIS — E11621 Type 2 diabetes mellitus with foot ulcer: Secondary | ICD-10-CM | POA: Diagnosis not present

## 2021-03-29 NOTE — Telephone Encounter (Signed)
Attempted to contact pt regarding re-referral again to paramedicine. No answer.   Marylouise Stacks, EMT-Paramedic  03/29/21

## 2021-03-31 NOTE — Progress Notes (Signed)
[name], [name] ([id number]) Visit Report for 03/29/2021 HPI Details Patient Name: Date of Service: [name] 03/29/2021 2:15 PM Medical Record Number: [id number] Patient Account Number: [id number] Date of Birth/Sex: Treating RN: [birth year] (60 y.o. [name] Primary Care Provider: SYSTEM, PRO V IDER Other Clinician: Referring Provider: Treating Provider/Extender: [name]: 5 History of Present Illness HPI Description: This 60 year old patient who has a very long significant history of diabetes mellitus, previous alcohol and nicotine abuse, chronic data disease, COPD, diabetes mellitus, height hypertension, critical lower limb ischemia with several wounds being managed at the wound Center at [facility] for over a year. She was recently in the ER at [facility] and was referred to our center. He has had a long history of critical limb ischemia and over a period of time has had balloon angioplasties in March 2016, endarterectomy of the left carotid, lower extremity angiogram and treatment by Dr. Quay Burow, several cardiac catheterizations. Most recently she had an x-ray of her left foot while in the ER which showed no acute abnormality. during this ER visit she was started on ciprofloxacin and asked to continue with the wound care physicians at [facility]. Her last ABI done in June 2017 showed the right side to be 0.28 in the left side to be 0.48. Her right toe brachial index was 0.17 on the right and 0.27 on the left. her last hemoglobin A1c was 11.1% She continues to smoke about a pack of cigarettes a day. 03/28/2017 -- -- right foot x-ray -- IMPRESSION:Areas of soft tissue swelling. Mild subluxation second PIP joint. No frank dislocation or fracture. No erosive change or bony destruction. No soft tissue ulceration or radiopaque foreign body evident by radiography. There is plantar fascia calcification with a nearby inferior calcaneal spur.  There are foci of arterial vascular calcification. 04/04/2017 -- the patient continues to be noncompliant and continues to complain of a lot of pain and has not done anything about quitting smoking Readmission: 06/26/18 on evaluation today patient presents for reevaluation she has not been seen in our office since May 2018. Since she was last seen here in our office she has undergone testing at Dr. Elspeth Cho office where it was revealed that she had an ABI of 0.68 with her previous ABI being 0.64 and a left ABI of 0.38 with previous ABI being 0.34 this study which was performed on 04/05/18 was pretty much equivalent to the study performed on 11/22/17. The findings in the end revealed on the right would appear to be moderate right lower extremity arterial disease on the left Dr. Gwenlyn Found states moderate in the report but unfortunately this appears to be much more severe at 0.38 compared to the right. Subsequently the patient was scheduled to have angiography with Dr. Gwenlyn Found on 04/12/18. This was however canceled due to the fact that the patient was found to have chronic kidney disease stage III and it was to the point that he did not feel that it will be safe to pursue angiography at that point. She has not been on any antibiotics recently. At this point Dr. Gwenlyn Found has not rescheduled anything as far as the injured [place] according to the patient he is somewhat reluctant to do so. Nonetheless with her diminished blood flow this is gonna make it somewhat difficult for her to heal. 07/05/18; this is a patient I have not seen previously. She has very significant PAD as noted above. She apparently has had  revascularization efforts by Dr. Gwenlyn Found on the right on 3 occasions to the patient. She was supposed to have an attempted angiography on the left however this was canceled apparently because of stage III chronic renal failure. I will need to research all of this. She complains of significant pain in the wound  and has claudication enough that when she walks to the end of the driveway she has to stop. She is been using silver alginate to the wounds on her legs and Iodoflex to the area on her left second toe. 07/13/18 on evaluation today patient's wounds actually appear to be doing about the same. She has an appointment she tells me within the next month that is September 2019 with Dr. Gwenlyn Found to discuss options to see if there's anything else that you can recommend or do for her. Nonetheless obviously what we're trying to do is do what we can to save her leg and in turn prevent any additional worsening or damage. None in the meantime we been mainly trying to manage her ulcers as best we can. 07/27/18; some improvement in the multiplicity of wounds on her left lower extremity and foot. She's been using silver alginate. I was unable to determine that she actually has an appointment with Dr. Gwenlyn Found. We are checking into this The patient's wound includes Left lateral foot, left plantar heel, her left anterior calf wound looks close to me, left fourth toe is very close to closed and the left medial calf is perhaps the largest open area READMISSION 02/19/2021 This is a now 60 year old woman with type 2 diabetes and continued cigarette smoking. She has known PAD. She was last in this clinic in 2019 at that point with wounds on her left foot. She left in a nonhealed state. On 06/28/2019 I see she had a left femoropopliteal by vein and vascular with an amputation of the fifth ray. These managed to heal over. Her last arterial studies were in February 2021. This showed an absent waveform at the posterior tibial artery on the right a dampened monophasic dorsalis pedis on the right of 0.44. On the left again the PTA TA was absent her dorsalis pedis was 0.99 triphasic and her great toe pressure was 0.65. This would have been after her revascularization. Once again she tells me that Dr. Gwenlyn Found has done revascularization on the  right leg on 3 different occasions. She has a right transmetatarsal amputation apparently done by Dr. Sharol Given remotely but I do not see a note for these right lower extremity revascularization but I may not of looked back far enough. In any case she says that the she has a wound on the plantar aspect of the right transmetatarsal amputation site there is been there for several months. More recently she fell and had a wound on her right medial lower leg she had 5 sutures placed in the ER and then subsequently she has developed an area on the medial lower leg which was a blister that opened up. Past medical history includes type 2 diabetes, PAD, chronic renal failure, congestive heart failure, right transmetatarsal amputation, left fifth ray amputation, continued tobacco abuse, atrial fibrillation and cirrhosis. We did not attempt an ABI on the right leg today because of pain 02/26/2021; patient we admitted to the clinic last week. She has what looks to be 2 areas on her right medial and right anterior lower leg that look more like venous wounds but she has 1 on the first met head at the base of  her right transmet. She has known severe PAD. She complains of a lot of pain although some of this may be neuropathic. Is difficult to exclude a component of claudication. Use silver alginate on the wounds on her legs and Iodoflex on the area on the first met head. She has an appointment with Dr. Gwenlyn Found on 5/25 although I will text him and discussed the situation. She is have poorly controlled diabetic. She has has stage IIIb chronic renal failure 4/22; patient presents for 1 week follow-up. The 2 areas on her right medial and right anterior lower leg appear well-healing. She has been using silver alginate to this area without issues. She has a first met head ulcer and it is unclear how long this has been there as it was discovered in the ED earlier this month when she was being evaluated for another issue. Iodoflex  has been used at this area. 4/29; patient presents for 1 week follow-up. She has been using silver alginate to the leg wounds and Iodoflex to the plantar foot wound. She has had this wrapped with Coban and Kerlix. She has no concerns or complaints today. 5/6; this is a difficult wound on the right plantar foot transmit site. We are not making a lot of progress. She had 2 more venous looking wounds on the right medial and right anterior lower leg 1 of which is healed. Her appointment with Dr. Gwenlyn Found is on 5/25 5/16; she did not tolerate the offloading shoe we gave her in fact she had a fall with an abrasion on her right forearm she is now back in the modified small shoe which does not offload her foot properly. Her appoint with Dr. Gwenlyn Found is still on 5/25 we have been using Iodoflex. The area on the right anterior lower leg is healed Electronic Signature(s) Signed: 03/30/2021 4:16:43 PM By: Linton Ham MD Entered By: Linton Ham on 03/29/2021 15:25:45 -------------------------------------------------------------------------------- Physical Exam Details Patient Name: Date of Service: [name], [name] 03/29/2021 2:15 PM Medical Record Number: [id number] Patient Account Number: [id number] Date of Birth/Sex: Treating RN: 09-29-1961 (60 y.o. [name] Primary Care Provider: SYSTEM, PRO V IDER Other Clinician: Referring Provider: Treating Provider/Extender: [name]: 5 Constitutional Sitting or standing Blood Pressure is within target range for patient.. Pulse regular and within target range for patient.[name] Respirations regular, non-labored and within target range.. Temperature is normal and within the target range for the patient.[name] Appears in no distress. Respiratory Wheezing; but otherwise work of breathing seems normal. Cardiovascular Pedal pulses are not palpable on the right popliteal pulses not palpable. Notes Wound exam; right anterior medial lower  leg wounds are closed. She has a new area that is superficial on the right dorsal forearm The area on the met head again does not have a viable surface. No debridement was done Electronic Signature(s) Signed: 03/30/2021 4:16:43 PM By: Linton Ham MD Entered By: Linton Ham on 03/29/2021 15:29:44 -------------------------------------------------------------------------------- Physician Orders Details Patient Name: Date of Service: [name], [name] 03/29/2021 2:15 PM Medical Record Number: [id number] Patient Account Number: [id number] Date of Birth/Sex: Treating RN: 05/22/1961 (60 y.o. [name] Primary Care Provider: SYSTEM, PRO V IDER Other Clinician: Referring Provider: Treating Provider/Extender: [name]: 5 Verbal / Phone Orders: No Diagnosis Coding ICD-10 Coding Code Description 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.518 Non-pressure chronic ulcer of other part of right foot with other specified severity E11.51 Type 2 diabetes mellitus with diabetic  peripheral angiopathy without gangrene L97.811 Non-pressure chronic ulcer of other part of right lower leg limited to breakdown of skin Follow-up Appointments Return Appointment in 1 week. Bathing/ Shower/ Hygiene May shower with protection but do not get wound dressing(s) wet. Edema Control - Lymphedema / SCD / Other Elevate legs to the level of the heart or above for 30 minutes daily and/or when sitting, a frequency of: Avoid standing for long periods of time. Exercise regularly Off-Loading Open toe surgical shoe to: - with felt padding to offload Additional Orders / Instructions Stop/Decrease Smoking Follow Nutritious Diet Wound Treatment Wound #16 - Amputation Site - Transmetatarsal Wound Laterality: Plantar, Right Cleanser: Soap and Water 1 x Per Week/15 Days Discharge Instructions: May shower and wash wound with dial antibacterial soap and water prior to dressing  change. Prim Dressing: IODOFLEX 0.9% Cadexomer Iodine Pad 4x6 cm 1 x Per Week/15 Days ary Discharge Instructions: Apply to wound bed as instructed Secondary Dressing: Woven Gauze Sponge, Non-Sterile 4x4 in 1 x Per Week/15 Days Discharge Instructions: Apply over primary dressing as directed. Compression Wrap: Kerlix Roll 4.5x3.1 (in/yd) 1 x Per Week/15 Days Discharge Instructions: Apply Kerlix and Coban compression as directed. Compression Wrap: Coban Self-Adherent Wrap 4x5 (in/yd) 1 x Per Week/15 Days Discharge Instructions: Apply over Kerlix as directed. Wound #19 - Upper Arm Wound Laterality: Right Cleanser: Soap and Water 1 x Per Day Discharge Instructions: May shower and wash wound with dial antibacterial soap and water prior to dressing change. Prim Dressing: Xeroform Occlusive Gauze Dressing, 4x4 in 1 x Per Day ary Discharge Instructions: Apply to wound bed as instructed Secondary Dressing: Woven Gauze Sponge, Non-Sterile 4x4 in 1 x Per Day Discharge Instructions: Apply over primary dressing as directed. Secured With: [employer], 4.5x3.1 (in/yd) 1 x Per Day Discharge Instructions: Secure with Kerlix as directed. Secured With: Paper Tape, 2x10 (in/yd) 1 x Per Day Discharge Instructions: Secure dressing with tape as directed. Electronic Signature(s) Signed: 03/30/2021 4:16:43 PM By: Linton Ham MD Signed: 03/31/2021 6:30:22 PM By: Levan Hurst RN, BSN Entered By: Levan Hurst on 03/29/2021 15:19:54 -------------------------------------------------------------------------------- Problem List Details Patient Name: Date of Service: [name], [name] 03/29/2021 2:15 PM Medical Record Number: [id number] Patient Account Number: [id number] Date of Birth/Sex: Treating RN: 03-25-1961 (60 y.o. [name] Primary Care Provider: SYSTEM, PRO V IDER Other Clinician: Referring Provider: Treating Provider/Extender: [name]: 5 Active  Problems ICD-10 Encounter Code Description Active Date MDM Diagnosis 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 2 diabetes mellitus with foot ulcer 02/19/2021 No Yes L97.518 Non-pressure chronic ulcer of other part of right foot with other specified 02/19/2021 No Yes severity E11.51 Type 2 diabetes mellitus with diabetic peripheral angiopathy without gangrene 02/19/2021 No Yes S40.811D Abrasion of right upper arm, subsequent encounter 03/29/2021 No Yes Inactive Problems ICD-10 Code Description Active Date Inactive Date L97.811 Non-pressure chronic ulcer of other part of right lower leg limited to breakdown of skin 02/19/2021 02/19/2021 Resolved Problems Electronic Signature(s) Signed: 03/30/2021 4:16:43 PM By: Linton Ham MD Entered By: Linton Ham on 03/29/2021 15:24:27 -------------------------------------------------------------------------------- Progress Note Details Patient Name: Date of Service: [name], [name] 03/29/2021 2:15 PM Medical Record Number: [id number] Patient Account Number: [id number] Date of Birth/Sex: Treating RN: 1961/09/08 (60 y.o. [name] Primary Care Provider: SYSTEM, PRO V IDER Other Clinician: Referring Provider: Treating Provider/Extender: [name]: 5 Subjective History of Present Illness (HPI) This 60 year old patient who has a very long significant history of diabetes mellitus, previous alcohol and  nicotine abuse, chronic data disease, COPD, diabetes mellitus, height hypertension, critical lower limb ischemia with several wounds being managed at the wound Center at [facility] for over a year. She was recently in the ER at [facility] and was referred to our center. He has had a long history of critical limb ischemia and over a period of time has had balloon angioplasties in March 2016, endarterectomy of the left carotid, lower extremity angiogram and treatment by Dr. Quay Burow, several cardiac catheterizations. Most recently she  had an x-ray of her left foot while in the ER which showed no acute abnormality. during this ER visit she was started on ciprofloxacin and asked to continue with the wound care physicians at [facility]. Her last ABI done in June 2017 showed the right side to be 0.28 in the left side to be 0.48. Her right toe brachial index was 0.17 on the right and 0.27 on the left. her last hemoglobin A1c was 11.1% She continues to smoke about a pack of cigarettes a day. 03/28/2017 -- -- right foot x-ray -- IMPRESSION:Areas of soft tissue swelling. Mild subluxation second PIP joint. No frank dislocation or fracture. No erosive change or bony destruction. No soft tissue ulceration or radiopaque foreign body evident by radiography. There is plantar fascia calcification with a nearby inferior calcaneal spur. There are foci of arterial vascular calcification. 04/04/2017 -- the patient continues to be noncompliant and continues to complain of a lot of pain and has not done anything about quitting smoking Readmission: 06/26/18 on evaluation today patient presents for reevaluation she has not been seen in our office since May 2018. Since she was last seen here in our office she has undergone testing at Dr. Fransico Khairi Garman office where it was revealed that she had an ABI of 0.68 with her previous ABI being 0.64 and a left ABI of 0.38 with previous ABI being 0.34 this study which was performed on 04/05/18 was pretty much equivalent to the study performed on 11/22/17. The findings in the end revealed on the right would appear to be moderate right lower extremity arterial disease on the left Dr. Gwenlyn Found states moderate in the report but unfortunately this appears to be much more severe at 0.38 compared to the right. Subsequently the patient was scheduled to have angiography with Dr. Gwenlyn Found on 04/12/18. This was however canceled due to the fact that the patient was found to have chronic kidney disease stage III and it was to the  point that he did not feel that it will be safe to pursue angiography at that point. She has not been on any antibiotics recently. At this point Dr. Gwenlyn Found has not rescheduled anything as far as the injured [place] according to the patient he is somewhat reluctant to do so. Nonetheless with her diminished blood flow this is gonna make it somewhat difficult for her to heal. 07/05/18; this is a patient I have not seen previously. She has very significant PAD as noted above. She apparently has had revascularization efforts by Dr. Gwenlyn Found on the right on 3 occasions to the patient. She was supposed to have an attempted angiography on the left however this was canceled apparently because of stage III chronic renal failure. I will need to research all of this. She complains of significant pain in the wound and has claudication enough that when she walks to the end of the driveway she has to stop. She is been using silver alginate to the wounds on her  legs and Iodoflex to the area on her left second toe. 07/13/18 on evaluation today patient's wounds actually appear to be doing about the same. She has an appointment she tells me within the next month that is September 2019 with Dr. Gwenlyn Found to discuss options to see if there's anything else that you can recommend or do for her. Nonetheless obviously what we're trying to do is do what we can to save her leg and in turn prevent any additional worsening or damage. None in the meantime we been mainly trying to manage her ulcers as best we can. 07/27/18; some improvement in the multiplicity of wounds on her left lower extremity and foot. She's been using silver alginate. I was unable to determine that she actually has an appointment with Dr. Gwenlyn Found. We are checking into this The patient's wound includes Left lateral foot, left plantar heel, her left anterior calf wound looks close to me, left fourth toe is very close to closed and the left medial calf is perhaps the largest  open area READMISSION 02/19/2021 This is a now 60 year old woman with type 2 diabetes and continued cigarette smoking. She has known PAD. She was last in this clinic in 2019 at that point with wounds on her left foot. She left in a nonhealed state. On 06/28/2019 I see she had a left femoropopliteal by vein and vascular with an amputation of the fifth ray. These managed to heal over. Her last arterial studies were in February 2021. This showed an absent waveform at the posterior tibial artery on the right a dampened monophasic dorsalis pedis on the right of 0.44. On the left again the PTA TA was absent her dorsalis pedis was 0.99 triphasic and her great toe pressure was 0.65. This would have been after her revascularization. Once again she tells me that Dr. Gwenlyn Found has done revascularization on the right leg on 3 different occasions. She has a right transmetatarsal amputation apparently done by Dr. Sharol Given remotely but I do not see a note for these right lower extremity revascularization but I may not of looked back far enough. In any case she says that the she has a wound on the plantar aspect of the right transmetatarsal amputation site there is been there for several months. More recently she fell and had a wound on her right medial lower leg she had 5 sutures placed in the ER and then subsequently she has developed an area on the medial lower leg which was a blister that opened up. Past medical history includes type 2 diabetes, PAD, chronic renal failure, congestive heart failure, right transmetatarsal amputation, left fifth ray amputation, continued tobacco abuse, atrial fibrillation and cirrhosis. We did not attempt an ABI on the right leg today because of pain 02/26/2021; patient we admitted to the clinic last week. She has what looks to be 2 areas on her right medial and right anterior lower leg that look more like venous wounds but she has 1 on the first met head at the base of her right transmet.  She has known severe PAD. She complains of a lot of pain although some of this may be neuropathic. Is difficult to exclude a component of claudication. Use silver alginate on the wounds on her legs and Iodoflex on the area on the first met head. She has an appointment with Dr. Gwenlyn Found on 5/25 although I will text him and discussed the situation. She is have poorly controlled diabetic. She has has stage IIIb chronic renal failure 4/22;  patient presents for 1 week follow-up. The 2 areas on her right medial and right anterior lower leg appear well-healing. She has been using silver alginate to this area without issues. She has a first met head ulcer and it is unclear how long this has been there as it was discovered in the ED earlier this month when she was being evaluated for another issue. Iodoflex has been used at this area. 4/29; patient presents for 1 week follow-up. She has been using silver alginate to the leg wounds and Iodoflex to the plantar foot wound. She has had this wrapped with Coban and Kerlix. She has no concerns or complaints today. 5/6; this is a difficult wound on the right plantar foot transmit site. We are not making a lot of progress. She had 2 more venous looking wounds on the right medial and right anterior lower leg 1 of which is healed. Her appointment with Dr. Gwenlyn Found is on 5/25 5/16; she did not tolerate the offloading shoe we gave her in fact she had a fall with an abrasion on her right forearm she is now back in the modified small shoe which does not offload her foot properly. Her appoint with Dr. Gwenlyn Found is still on 5/25 we have been using Iodoflex. The area on the right anterior lower leg is healed Objective Constitutional Sitting or standing Blood Pressure is within target range for patient.. Pulse regular and within target range for patient.[name] Respirations regular, non-labored and within target range.. Temperature is normal and within the target range for the patient.[name]  Appears in no distress. Vitals Time Taken: 2:31 PM, Temperature: 97.9 F, Pulse: 75 bpm, Respiratory Rate: 20 breaths/min, Blood Pressure: 105/68 mmHg, Capillary Blood Glucose: 245 mg/dl. Respiratory Wheezing; but otherwise work of breathing seems normal. Cardiovascular Pedal pulses are not palpable on the right popliteal pulses not palpable. General Notes: Wound exam; right anterior medial lower leg wounds are closed. She has a new area that is superficial on the right dorsal forearm oo The area on the met head again does not have a viable surface. No debridement was done Integumentary (Hair, Skin) Wound #16 status is Open. Original cause of wound was Gradually Appeared. The date acquired was: 02/05/2021. The wound has been in treatment 5 weeks. The wound is located on the Right,Plantar Amputation Site - Transmetatarsal. The wound measures 1cm length x 1.4cm width x 0.7cm depth; 1.1cm^2 area and 0.77cm^3 volume. There is Fat Layer (Subcutaneous Tissue) exposed. There is no tunneling or undermining noted. There is a medium amount of serosanguineous drainage noted. The wound margin is thickened. There is small (1-33%) pink, pale granulation within the wound bed. There is a large (67-100%) amount of necrotic tissue within the wound bed including Adherent Slough. Wound #18 status is Healed - Epithelialized. Original cause of wound was Trauma. The date acquired was: 02/05/2021. The wound has been in treatment 5 weeks. The wound is located on the Right,Anterior Lower Leg. The wound measures 0cm length x 0cm width x 0cm depth; 0cm^2 area and 0cm^3 volume. There is no tunneling or undermining noted. There is a none present amount of drainage noted. There is no granulation within the wound bed. There is no necrotic tissue within the wound bed. Wound #19 status is Open. Original cause of wound was Skin T ear/Laceration. The date acquired was: 03/27/2021. The wound is located on the Right Upper Arm. The  wound measures 4.2cm length x 1.6cm width x 0.1cm depth; 5.278cm^2 area and 0.528cm^3 volume. There is  Fat Layer (Subcutaneous Tissue) exposed. There is no tunneling or undermining noted. There is a medium amount of serosanguineous drainage noted. The wound margin is flat and intact. There is large (67-100%) red granulation within the wound bed. There is a small (1-33%) amount of necrotic tissue within the wound bed including Adherent Slough. 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 Bathing/ Shower/ Hygiene: May shower with protection but do not get wound dressing(s) wet. Edema Control - Lymphedema / SCD / Other: Elevate legs to the level of the heart or above for 30 minutes daily and/or when sitting, a frequency of: Avoid standing for long periods of time. Exercise regularly Off-Loading: Open toe surgical shoe to: - with felt padding to offload Additional Orders / Instructions: Stop/Decrease Smoking Follow Nutritious Diet WOUND #16: - Amputation Site - Transmetatarsal Wound Laterality: Plantar, Right Cleanser: Soap and Water 1 x Per Week/15 Days Discharge Instructions: May shower and wash wound with dial antibacterial soap and water prior to dressing change. Prim Dressing: IODOFLEX 0.9% Cadexomer Iodine Pad 4x6 cm 1 x Per Week/15 Days ary Discharge Instructions: Apply to wound bed as instructed Secondary Dressing: Woven Gauze Sponge, Non-Sterile 4x4 in 1 x Per Week/15 Days Discharge Instructions: Apply over primary dressing as directed. Com pression Wrap: Kerlix Roll 4.5x3.1 (in/yd) 1 x Per Week/15 Days Discharge Instructions: Apply Kerlix and Coban compression as directed. Com  pression Wrap: Coban Self-Adherent Wrap 4x5 (in/yd) 1 x Per Week/15 Days Discharge Instructions: Apply over Kerlix as directed. WOUND #19: - Upper Arm Wound Laterality: Right Cleanser: Soap and Water 1 x Per Day/ Discharge Instructions: May shower and wash wound with dial antibacterial soap and water prior to dressing change. Prim Dressing: Xeroform Occlusive Gauze Dressing, 4x4 in 1 x Per Day/ ary Discharge Instructions: Apply to wound bed as instructed Secondary Dressing: Woven Gauze Sponge, Non-Sterile 4x4 in 1 x Per Day/ Discharge Instructions: Apply over primary dressing as directed. Secured With: [employer], 4.5x3.1 (in/yd) 1 x Per Day/ Discharge Instructions: Secure with Kerlix as directed. Secured With: Paper T ape, 2x10 (in/yd) 1 x Per Day/ Discharge Instructions: Secure dressing with tape as directed. #1 continue with Iodoflex on the right foot wound. This is not making any progress 2. We attempted to do her ABIs, pressure perhaps at 50. Monophasic 3. I believe I have already reached out to Dr. Gwenlyn Found about this her appointment is on 5/25 I will reach out to him again. 4. I do not think this is going to heal unless something could be done to establish perfusion. Also she is back in this shoe that does not offload this area. She did have a fall using her forefoot off loader 5. The area on the right arm is a superficial wound we will apply Xeroform here. Electronic Signature(s) Signed: 03/30/2021 4:16:43 PM By: Linton Ham MD Entered By: Linton Ham on 03/29/2021 15:32:29 -------------------------------------------------------------------------------- SuperBill Details Patient Name: Date of Service: [name], [name] 03/29/2021 Medical Record Number: [id number] Patient Account Number: [id number] Date of Birth/Sex: Treating RN: 05/06/1961 (60 y.o. [name] Primary Care Provider: SYSTEM, PRO V IDER Other Clinician: Referring Provider: Treating  Provider/Extender: [name]: 5 Diagnosis Coding ICD-10 Codes Code Description 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 2 diabetes mellitus with  foot ulcer L97.518 Non-pressure chronic ulcer of other part of right foot with other specified severity E11.51 Type 2 diabetes mellitus with diabetic peripheral angiopathy without gangrene S40.811D Abrasion of right upper arm, subsequent encounter Facility Procedures CPT4 Code: [id number] Description: 99214 - WOUND CARE VISIT-LEV 4 EST PT Modifier: Quantity: 1 Physician Procedures : CPT4 Code Description Modifier [id number] 99214 - WC PHYS LEVEL 4 - EST PT ICD-10 Diagnosis Description L97.518 Non-pressure chronic ulcer of other part of right foot with other specified severity 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 2 diabetes mellitus with foot ulcer E11.51  Type 2 diabetes mellitus with diabetic peripheral angiopathy without gangrene S40.811D Abrasion of right upper arm, subsequent encounter Quantity: 1 Electronic Signature(s) Signed: 03/30/2021 4:16:43 PM By: Linton Ham MD Signed: 03/31/2021 6:30:22 PM By: Levan Hurst RN, BSN Entered By: Levan Hurst on 03/29/2021 17:20:02

## 2021-03-31 NOTE — Progress Notes (Signed)
**Note Karla-Identified via Obfuscation** Karla, Price ([id number]) Visit Report for 03/29/2021 Arrival Information Details Patient Name: Date of Service: [name] 03/29/2021 2:15 PM Medical Record Number: [id number] Patient Account Number: [id number] Date of Birth/Sex: Treating RN: [birth year] (60 y.o. [name] Primary Care Duchess Armendarez: SYSTEM, PRO V IDER Other Clinician: Referring Bettylee Feig: Treating Christell Steinmiller/Extender: [name]: 5 Visit Information History Since Last Visit Added or deleted any medications: No Patient Arrived: Wheel Chair Any new allergies or adverse reactions: No Arrival Time: 14:20 Had a fall or experienced change in Yes Accompanied By: self activities of daily living that may affect Transfer Assistance: None risk of falls: Patient Identification Verified: Yes Signs or symptoms of abuse/neglect since last visito No Secondary Verification Process Completed: Yes Hospitalized since last visit: No Patient Requires Transmission-Based Precautions: No Implantable device outside of the clinic excluding No Patient Has Alerts: No cellular tissue based products placed in the center since last visit: Has Dressing in Place as Prescribed: Yes Pain Present Now: Yes Electronic Signature(s) Signed: 03/29/2021 2:51:01 PM By: Sandre Kitty Entered By: Sandre Kitty on 03/29/2021 14:31:23 -------------------------------------------------------------------------------- Clinic Level of Care Assessment Details Patient Name: Date of Service: [name] 03/29/2021 2:15 PM Medical Record Number: [id number] Patient Account Number: [id number] Date of Birth/Sex: Treating RN: [birth year] (60 y.o. [name] Primary Care Stevey Stapleton: SYSTEM, PRO V IDER Other Clinician: Referring Biff Rutigliano: Treating Traylon Schimming/Extender: [name]: 5 Clinic Level of Care Assessment Items TOOL 4 Quantity Score X- 1 0 Use when only an EandM is performed on FOLLOW-UP  visit ASSESSMENTS - Nursing Assessment / Reassessment X- 1 10 Reassessment of Co-morbidities (includes updates in patient status) X- 1 5 Reassessment of Adherence to Treatment Plan ASSESSMENTS - Wound and Skin A ssessment / Reassessment '[]'$  - 0 Simple Wound Assessment / Reassessment - one wound X- 2 5 Complex Wound Assessment / Reassessment - multiple wounds '[]'$  - 0 Dermatologic / Skin Assessment (not related to wound area) ASSESSMENTS - Focused Assessment '[]'$  - 0 Circumferential Edema Measurements - multi extremities '[]'$  - 0 Nutritional Assessment / Counseling / Intervention X- 1 5 Lower Extremity Assessment (monofilament, tuning fork, pulses) '[]'$  - 0 Peripheral Arterial Disease Assessment (using hand held doppler) ASSESSMENTS - Ostomy and/or Continence Assessment and Care '[]'$  - 0 Incontinence Assessment and Management '[]'$  - 0 Ostomy Care Assessment and Management (repouching, etc.) PROCESS - Coordination of Care X - Simple Patient / Family Education for ongoing care 1 15 '[]'$  - 0 Complex (extensive) Patient / Family Education for ongoing care X- 1 10 Staff obtains Programmer, systems, Records, T Results / Process Orders est '[]'$  - 0 Staff telephones HHA, Nursing Homes / Clarify orders / etc '[]'$  - 0 Routine Transfer to another Facility (non-emergent condition) '[]'$  - 0 Routine Hospital Admission (non-emergent condition) '[]'$  - 0 New Admissions / Biomedical engineer / Ordering NPWT Apligraf, etc. , '[]'$  - 0 Emergency Hospital Admission (emergent condition) X- 1 10 Simple Discharge Coordination '[]'$  - 0 Complex (extensive) Discharge Coordination PROCESS - Special Needs '[]'$  - 0 Pediatric / Minor Patient Management '[]'$  - 0 Isolation Patient Management '[]'$  - 0 Hearing / Language / Visual special needs '[]'$  - 0 Assessment of Community assistance (transportation, D/C planning, etc.) '[]'$  - 0 Additional assistance / Altered mentation '[]'$  - 0 Support Surface(s) Assessment (bed, cushion, seat,  etc.) INTERVENTIONS - Wound Cleansing / Measurement '[]'$  - 0 Simple Wound Cleansing - one wound X- 2 5 Complex Wound Cleansing - multiple wounds X-  1 5 Wound Imaging (photographs - any number of wounds) '[]'$  - 0 Wound Tracing (instead of photographs) '[]'$  - 0 Simple Wound Measurement - one wound X- 2 5 Complex Wound Measurement - multiple wounds INTERVENTIONS - Wound Dressings X - Small Wound Dressing one or multiple wounds 1 10 '[]'$  - 0 Medium Wound Dressing one or multiple wounds X- 1 20 Large Wound Dressing one or multiple wounds '[]'$  - 0 Application of Medications - topical '[]'$  - 0 Application of Medications - injection INTERVENTIONS - Miscellaneous '[]'$  - 0 External ear exam '[]'$  - 0 Specimen Collection (cultures, biopsies, blood, body fluids, etc.) '[]'$  - 0 Specimen(s) / Culture(s) sent or taken to Lab for analysis '[]'$  - 0 Patient Transfer (multiple staff / Civil Service fast streamer / Similar devices) '[]'$  - 0 Simple Staple / Suture removal (25 or less) '[]'$  - 0 Complex Staple / Suture removal (26 or more) '[]'$  - 0 Hypo / Hyperglycemic Management (close monitor of Blood Glucose) '[]'$  - 0 Ankle / Brachial Index (ABI) - do not check if billed separately X- 1 5 Vital Signs Has the patient been seen at the hospital within the last three years: Yes Total Score: 125 Level Of Care: New/Established - Level 4 Electronic Signature(s) Signed: 03/31/2021 6:30:22 PM By: Levan Hurst RN, BSN Entered By: Levan Hurst on 03/29/2021 17:19:54 -------------------------------------------------------------------------------- Encounter Discharge Information Details Patient Name: Date of Service: Karla Corwin, [name] 03/29/2021 2:15 PM Medical Record Number: [id number] Patient Account Number: [id number] Date of Birth/Sex: Treating RN: Nov 10, 1961 (60 y.o. [name] Primary Care Maverick Patman: SYSTEM, PRO Ave Filter Other Clinician: Referring Lawrence Roldan: Treating Rich Paprocki/Extender: [name]:  5 Encounter Discharge Information Items Discharge Condition: Stable Ambulatory Status: Wheelchair Discharge Destination: Home Transportation: Private Auto Schedule Follow-up Appointment: Yes Clinical Summary of Care: Provided on 03/29/2021 Form Type Recipient Paper Patient Patient Electronic Signature(s) Signed: 03/29/2021 5:51:52 PM By: Lorrin Jackson Entered By: Lorrin Jackson on 03/29/2021 17:51:52 -------------------------------------------------------------------------------- Lower Extremity Assessment Details Patient Name: Date of Service: Karla Corwin, [name] 03/29/2021 2:15 PM Medical Record Number: [id number] Patient Account Number: [id number] Date of Birth/Sex: Treating RN: 1960/12/25 (60 y.o. [name] Primary Care Falon Huesca: SYSTEM, PRO Ave Filter Other Clinician: Referring Lolamae Voisin: Treating Niveah Boerner/Extender: [name]: 5 Edema Assessment Assessed: [Left: No] [Right: No] Edema: [Left: Ye] [Right: s] Calf Left: Right: Point of Measurement: From Medial Instep 44.5 cm Ankle Left: Right: Point of Measurement: From Medial Instep 22.3 cm Vascular Assessment Pulses: Dorsalis Pedis Palpable: [Right:Yes] Electronic Signature(s) Signed: 03/29/2021 4:50:33 PM By: Baruch Gouty RN, BSN Entered By: Baruch Gouty on 03/29/2021 15:02:23 -------------------------------------------------------------------------------- Multi Wound Chart Details Patient Name: Date of Service: Karla Corwin, [name] 03/29/2021 2:15 PM Medical Record Number: [id number] Patient Account Number: [id number] Date of Birth/Sex: Treating RN: [birth year] (60 y.o. [name] Primary Care Sharmayne Jablon: SYSTEM, PRO V IDER Other Clinician: Referring Azelia Reiger: Treating Babara Buffalo/Extender: [name]: 5 Vital Signs Height(in): Capillary Blood Glucose(mg/dl): 245 Weight(lbs): Pulse(bpm): 50 Body Mass Index(BMI): Blood Pressure(mmHg):  105/68 Temperature(F): 97.9 Respiratory Rate(breaths/min): 20 Photos: [16:No Photos Right, Plantar Amputation Site -] [18:No Photos Right, Anterior Lower Leg] [19:No Photos Right Upper Arm] Wound Location: [16:Transmetatarsal Gradually Appeared] [18:Trauma] [19:Skin Tear/Laceration] Wounding Event: [16:Diabetic Wound/Ulcer of the Lower] [18:Diabetic Wound/Ulcer of the Lower] [19:Skin Tear] Primary Etiology: [16:Extremity Chronic Obstructive Pulmonary] [18:Extremity Chronic Obstructive Pulmonary] [19:Chronic Obstructive Pulmonary] Comorbid History: [16:Disease (COPD), Sleep Apnea, Arrhythmia, Congestive Heart Failure, Coronary Artery Disease, Hypertension, Peripheral Arterial Disease, Cirrhosis ,  Type II Diabetes, Osteoarthritis, Neuropathy 02/05/2021] [18:Disease (COPD), Sleep  Apnea, Arrhythmia, Congestive Heart Failure, Coronary Artery Disease, Hypertension, Peripheral Arterial Disease, Cirrhosis , Type II Diabetes, Osteoarthritis, Neuropathy 02/05/2021] [19:Disease (COPD), Sleep Apnea, Coronary Artery Disease, Osteoarthritis,  Neuropathy 03/27/2021] Date Acquired: [16:5] [18:5] [19:0] Weeks of Treatment: [16:Open] [18:Healed - Epithelialized] [19:Open] Wound Status: [16:1x1.4x0.7] [18:0x0x0] [19:4.2x1.6x0.1] Measurements L x W x D (cm) [16:1.1] [18:0] [19:5.278] A (cm) : rea [16:0.77] [18:0] [19:0.528] Volume (cm) : [16:32.70%] [18:100.00%] [19:0.00%] % Reduction in Area: [16:-57.10%] [18:100.00%] [19:0.00%] % Reduction in Volume: [16:Grade 2] [18:Grade 2] [19:Full Thickness Without Exposed] Classification: [16:Medium] [18:None Present] [19:Support Structures Medium] Exudate Amount: [16:Serosanguineous] [18:N/A] [19:Serosanguineous] Exudate Type: [16:red, brown] [18:N/A] [19:red, brown] Exudate Color: [16:Thickened] [18:N/A] [19:Flat and Intact] Wound Margin: [16:Small (1-33%)] [18:None Present (0%)] [19:Large (67-100%)] Granulation Amount: [16:Pink, Pale] [18:N/A] [19:Red] Granulation  Quality: [16:Large (67-100%)] [18:None Present (0%)] [19:Small (1-33%)] Necrotic Amount: [16:Fat Layer (Subcutaneous Tissue): Yes Fascia: No] [19:Fat Layer (Subcutaneous Tissue): Yes] Exposed Structures: [16:Fascia: No Tendon: No Muscle: No Joint: No Bone: No None] [18:Fat Layer (Subcutaneous Tissue): No Fascia: No Tendon: No Muscle: No Joint: No Bone: No Large (67-100%)] [19:Tendon: No Muscle: No Joint: No Bone: No Small (1-33%)] Treatment Notes Electronic Signature(s) Signed: 03/30/2021 4:16:43 PM By: Linton Ham MD Signed: 03/31/2021 6:30:22 PM By: Levan Hurst RN, BSN Entered By: Linton Ham on 03/29/2021 15:24:36 -------------------------------------------------------------------------------- Multi-Disciplinary Care Plan Details Patient Name: Date of Service: Karla Corwin, [name] 03/29/2021 2:15 PM Medical Record Number: [id number] Patient Account Number: [id number] Date of Birth/Sex: Treating RN: [birth year] (60 y.o. [name] Primary Care Yarah Fuente: SYSTEM, PRO V IDER Other Clinician: Referring Kohei Antonellis: Treating Jacari Iannello/Extender: [name]: 5 Active Inactive Wound/Skin Impairment Nursing Diagnoses: Impaired tissue integrity Goals: Patient/caregiver will verbalize understanding of skin care regimen Date Initiated: 02/19/2021 Target Resolution Date: 04/30/2021 Goal Status: Active Ulcer/skin breakdown will have a volume reduction of 30% by week 4 Date Initiated: 02/19/2021 Date Inactivated: 03/29/2021 Target Resolution Date: 04/09/2021 Goal Status: Unmet Unmet Reason: PAD Interventions: Assess patient/caregiver ability to obtain necessary supplies Assess patient/caregiver ability to perform ulcer/skin care regimen upon admission and as needed Assess ulceration(s) every visit Provide education on ulcer and skin care Treatment Activities: Skin care regimen initiated : 02/19/2021 Topical wound management initiated :  02/19/2021 Notes: Electronic Signature(s) Signed: 03/31/2021 6:30:22 PM By: Levan Hurst RN, BSN Entered By: Levan Hurst on 03/29/2021 17:19:10 -------------------------------------------------------------------------------- Pain Assessment Details Patient Name: Date of Service: Karla Corwin, [name] 03/29/2021 2:15 PM Medical Record Number: [id number] Patient Account Number: [id number] Date of Birth/Sex: Treating RN: July 28, 1961 (60 y.o. [name] Primary Care Armon Orvis: SYSTEM, PRO V IDER Other Clinician: Referring Ana Woodroof: Treating Arrielle Mcginn/Extender: [name]: 5 Active Problems Location of Pain Severity and Description of Pain Patient Has Paino Yes Site Locations Rate the pain. Current Pain Level: 9 Pain Management and Medication Current Pain Management: Electronic Signature(s) Signed: 03/29/2021 2:51:01 PM By: Sandre Kitty Signed: 03/31/2021 6:30:22 PM By: Levan Hurst RN, BSN Entered By: Sandre Kitty on 03/29/2021 14:31:58 -------------------------------------------------------------------------------- Patient/Caregiver Education Details Patient Name: Date of Service: [name] 5/16/2022andnbsp2:15 PM Medical Record Number: [id number] Patient Account Number: [id number] Date of Birth/Gender: Treating RN: April 18, 1961 (60 y.o. [name] Primary Care Physician: SYSTEM, PRO V IDER Other Clinician: Referring Physician: Treating Physician/Extender: [name]: 5 Education Assessment Education Provided To: Patient Education Topics Provided Wound/Skin Impairment: Methods: Explain/Verbal Responses: State content correctly Electronic Signature(s) Signed: 03/31/2021 6:30:22 PM By:  Levan Hurst RN, BSN Entered By: Levan Hurst on 03/29/2021 17:19:21 -------------------------------------------------------------------------------- Wound Assessment Details Patient Name: Date of Service: Karla  Maretta Price 03/29/2021 2:15 PM Medical Record Number: [id number] Patient Account Number: [id number] Date of Birth/Sex: Treating RN: December 18, 1960 (60 y.o. [name] Primary Care Sterlin Knightly: SYSTEM, PRO V IDER Other Clinician: Referring Cassaundra Rasch: Treating Emerie Vanderkolk/Extender: [name]: 5 Wound Status Wound Number: 16 Primary Diabetic Wound/Ulcer of the Lower Extremity Etiology: Wound Location: Right, Plantar Amputation Site - Transmetatarsal Wound Open Wounding Event: Gradually Appeared Status: Date Acquired: 02/05/2021 Comorbid Chronic Obstructive Pulmonary Disease (COPD), Sleep Apnea, Weeks Of Treatment: 5 History: Arrhythmia, Congestive Heart Failure, Coronary Artery Disease, Clustered Wound: No Hypertension, Peripheral Arterial Disease, Cirrhosis , Type II Diabetes, Osteoarthritis, Neuropathy Photos Wound Measurements Length: (cm) 1 Width: (cm) 1.4 Depth: (cm) 0.7 Area: (cm) 1.1 Volume: (cm) 0.77 % Reduction in Area: 32.7% % Reduction in Volume: -57.1% Epithelialization: None Tunneling: No Undermining: No Wound Description Classification: Grade 2 Wound Margin: Thickened Exudate Amount: Medium Exudate Type: Serosanguineous Exudate Color: red, brown Foul Odor After Cleansing: No Slough/Fibrino Yes Wound Bed Granulation Amount: Small (1-33%) Exposed Structure Granulation Quality: Pink, Pale Fascia Exposed: No Necrotic Amount: Large (67-100%) Fat Layer (Subcutaneous Tissue) Exposed: Yes Necrotic Quality: Adherent Slough Tendon Exposed: No Muscle Exposed: No Joint Exposed: No Bone Exposed: No Treatment Notes 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 Peri-Wound Care Topical Primary Dressing IODOFLEX 0.9% Cadexomer Iodine Pad 4x6 cm Discharge Instruction:  Apply to wound bed as instructed Secondary Dressing Woven Gauze Sponge, Non-Sterile 4x4 in Discharge Instruction: Apply over primary dressing as directed. Secured With Compression Wrap Kerlix Roll 4.5x3.1 (in/yd) Discharge Instruction: Apply Kerlix and Coban compression as directed. Coban Self-Adherent Wrap 4x5 (in/yd) Discharge Instruction: Apply over Kerlix as directed. Compression Stockings Add-Ons Electronic Signature(s) Signed: 03/29/2021 5:26:16 PM By: Sandre Kitty Signed: 03/31/2021 6:30:22 PM By: Levan Hurst RN, BSN Previous Signature: 03/29/2021 4:50:33 PM Version By: Baruch Gouty RN, BSN Previous Signature: 03/29/2021 2:51:01 PM Version By: Sandre Kitty Entered By: Sandre Kitty on 03/29/2021 17:24:50 -------------------------------------------------------------------------------- Wound Assessment Details Patient Name: Date of Service: Karla Corwin, [name] 03/29/2021 2:15 PM Medical Record Number: [id number] Patient Account Number: [id number] Date of Birth/Sex: Treating RN: Jan 13, 1961 (60 y.o. [name] Primary Care Antonae Zbikowski: SYSTEM, PRO V IDER Other Clinician: Referring Eola Waldrep: Treating Jayleah Garbers/Extender: [name]: 5 Wound Status Wound Number: 18 Primary Diabetic Wound/Ulcer of the Lower Extremity Etiology: Wound Location: Right, Anterior Lower Leg Wound Healed - Epithelialized Wounding Event: Trauma Status: Date Acquired: 02/05/2021 Comorbid Chronic Obstructive Pulmonary Disease (COPD), Sleep Apnea, Weeks Of Treatment: 5 History: Arrhythmia, Congestive Heart Failure, Coronary Artery Disease, Clustered Wound: No Hypertension, Peripheral Arterial Disease, Cirrhosis , Type II Diabetes, Osteoarthritis, Neuropathy Wound Measurements Length: (cm) Width: (cm) Depth: (cm) Area: (cm) Volume: (cm) 0 % Reduction in Area: 100% 0 % Reduction in Volume: 100% 0 Epithelialization: Large (67-100%) 0 Tunneling: No 0  Undermining: No Wound Description Classification: Grade 2 Exudate Amount: None Present Foul Odor After Cleansing: No Slough/Fibrino No Wound Bed Granulation Amount: None Present (0%) Exposed Structure Necrotic Amount: None Present (0%) Fascia Exposed: No Fat Layer (Subcutaneous Tissue) Exposed: No Tendon Exposed: No Muscle Exposed: No Joint Exposed: No Bone Exposed: No Electronic Signature(s) Signed: 03/29/2021 4:50:33 PM By: Baruch Gouty RN, BSN Signed: 03/31/2021 6:30:22 PM By:  Levan Hurst RN, BSN Previous Signature: 03/29/2021 2:51:01 PM Version By: Sandre Kitty Entered By: Baruch Gouty on 03/29/2021 15:05:32 -------------------------------------------------------------------------------- Wound Assessment Details Patient Name: Date of Service: [name] 03/29/2021 2:15 PM Medical Record Number: [id number] Patient Account Number: [id number] Date of Birth/Sex: Treating RN: 1961/02/13 (60 y.o. [name] Primary Care Thula Stewart: SYSTEM, PRO V IDER Other Clinician: Referring Delicia Berens: Treating Laura Radilla/Extender: [name]: 5 Wound Status Wound Number: 19 Primary Skin T ear Etiology: Wound Location: Right Upper Arm Wound Open Wounding Event: Skin Tear/Laceration Status: Date Acquired: 03/27/2021 Comorbid Chronic Obstructive Pulmonary Disease (COPD), Sleep Apnea, Weeks Of Treatment: 0 History: Arrhythmia, Congestive Heart Failure, Coronary Artery Disease, Clustered Wound: No Hypertension, Peripheral Arterial Disease, Cirrhosis , Type II Diabetes, Osteoarthritis, Neuropathy Photos Wound Measurements Length: (cm) 4.2 Width: (cm) 1.6 Depth: (cm) 0.1 Area: (cm) 5.278 Volume: (cm) 0.528 % Reduction in Area: 0% % Reduction in Volume: 0% Epithelialization: Small (1-33%) Tunneling: No Undermining: No Wound Description Classification: Full Thickness Without Exposed Support Structures Wound Margin: Flat and Intact Exudate  Amount: Medium Exudate Type: Serosanguineous Exudate Color: red, brown Foul Odor After Cleansing: No Slough/Fibrino Yes Wound Bed Granulation Amount: Large (67-100%) Exposed Structure Granulation Quality: Red Fascia Exposed: No Necrotic Amount: Small (1-33%) Fat Layer (Subcutaneous Tissue) Exposed: Yes Necrotic Quality: Adherent Slough Tendon Exposed: No Muscle Exposed: No Joint Exposed: No Bone Exposed: No Treatment Notes Wound #19 (Upper Arm) Wound Laterality: Right Cleanser Soap and Water Discharge Instruction: May shower and wash wound with dial antibacterial soap and water prior to dressing change. Peri-Wound Care Topical Primary Dressing Xeroform Occlusive Gauze Dressing, 4x4 in Discharge Instruction: Apply to wound bed as instructed Secondary Dressing Woven Gauze Sponge, Non-Sterile 4x4 in Discharge Instruction: Apply over primary dressing as directed. Secured With [employer], 4.5x3.1 (in/yd) Discharge Instruction: Secure with Kerlix as directed. Paper Tape, 2x10 (in/yd) Discharge Instruction: Secure dressing with tape as directed. Compression Wrap Compression Stockings Add-Ons Electronic Signature(s) Signed: 03/29/2021 5:26:16 PM By: Sandre Kitty Signed: 03/31/2021 6:30:22 PM By: Levan Hurst RN, BSN Previous Signature: 03/29/2021 4:50:33 PM Version By: Baruch Gouty RN, BSN Previous Signature: 03/29/2021 2:51:01 PM Version By: Sandre Kitty Entered By: Sandre Kitty on 03/29/2021 17:23:44 -------------------------------------------------------------------------------- Vitals Details Patient Name: Date of Service: Karla Corwin, [name] 03/29/2021 2:15 PM Medical Record Number: [id number] Patient Account Number: [id number] Date of Birth/Sex: Treating RN: [birth year] (60 y.o. [name] Primary Care Kayliah Tindol: SYSTEM, PRO V IDER Other Clinician: Referring Monserrath Junio: Treating Terrez Ander/Extender: [name]: 5 Vital  Signs Time Taken: 14:31 Temperature (F): 97.9 Pulse (bpm): 75 Respiratory Rate (breaths/min): 20 Blood Pressure (mmHg): 105/68 Capillary Blood Glucose (mg/dl): 245 Reference Range: 80 - 120 mg / dl Electronic Signature(s) Signed: 03/29/2021 2:51:01 PM By: Sandre Kitty Entered By: Sandre Kitty on 03/29/2021 14:31:48

## 2021-04-01 ENCOUNTER — Telehealth (HOSPITAL_COMMUNITY): Payer: Self-pay | Admitting: Adult Health

## 2021-04-01 ENCOUNTER — Other Ambulatory Visit: Payer: Self-pay

## 2021-04-01 ENCOUNTER — Other Ambulatory Visit (HOSPITAL_COMMUNITY): Payer: Self-pay

## 2021-04-01 ENCOUNTER — Ambulatory Visit (HOSPITAL_COMMUNITY)
Admission: RE | Admit: 2021-04-01 | Discharge: 2021-04-01 | Disposition: A | Discharge status: Home / Self Care | Payer: Medicare Other | Source: Ambulatory Visit | Attending: Internal Medicine | Admitting: Internal Medicine

## 2021-04-01 ENCOUNTER — Other Ambulatory Visit (HOSPITAL_COMMUNITY): Payer: Self-pay | Admitting: *Deleted

## 2021-04-01 DIAGNOSIS — I5032 Chronic diastolic (congestive) heart failure: Secondary | ICD-10-CM | POA: Insufficient documentation

## 2021-04-01 LAB — BASIC METABOLIC PANEL
Anion gap: 8 (ref 5–15)
BUN: 40 mg/dL — ABNORMAL HIGH (ref 6–20)
CO2: 31 mmol/L (ref 22–32)
Calcium: 8.9 mg/dL (ref 8.9–10.3)
Chloride: 98 mmol/L (ref 98–111)
Creatinine, Ser: 1.54 mg/dL — ABNORMAL HIGH (ref 0.44–1.00)
GFR, Estimated: 39 mL/min — ABNORMAL LOW (ref >60)
Glucose, Bld: 342 mg/dL — ABNORMAL HIGH (ref 70–99)
Potassium: 3.8 mmol/L (ref 3.5–5.1)
Sodium: 137 mmol/L (ref 135–145)

## 2021-04-01 MED ORDER — EMPAGLIFLOZIN 10 MG PO TABS: 10.0000 mg | ORAL_TABLET | Freq: Every day | ORAL | 3 refills | Status: DC

## 2021-04-01 NOTE — Progress Notes (Signed)
Paramedicine Encounter    Patient ID: [name], female    DOB: 1961-11-02, 60 y.o.   MRN: [id number]   Patient Care Team: System, Provider Not In as PCP - General Gwenlyn Found [name], MD as PCP - Cardiology (Cardiology) [name], MD as PCP - Electrophysiology (Cardiology) [name], MD as Consulting Physician (Cardiology) [name], MD as Consulting Physician (Pulmonary Disease)  Patient Active Problem List   Diagnosis Date Noted  . Ulcer of foot, unspecified laterality, limited to breakdown of skin ([place]) 02/17/2021  . Altered mental status 01/02/2021  . AF (paroxysmal atrial fibrillation) ([place]) 01/02/2021  . CAD (coronary artery disease) 01/02/2021  . PVD (peripheral vascular disease) ([place]) 01/02/2021  . Acute renal failure superimposed on stage 4 chronic kidney disease ([place]) 01/02/2021  . Diabetes mellitus type 2, uncontrolled, with complications ([place]) [id number]  . Diabetic nephropathy ([place]) 01/02/2021  . Low back pain 06/01/2020  . Hyperlipidemia 04/03/2020  . Muscle weakness 03/20/2020  . Closed fracture of neck of right radius 03/13/2020  . Pain in elbow 03/12/2020  . PAD (peripheral artery disease) ([place]) 12/26/2019  . Acute renal failure ([place])   . Acute respiratory failure with hypoxia ([place]) 12/02/2019  . Acute exacerbation of CHF (congestive heart failure) ([place]) 12/01/2019  . Cellulitis in diabetic foot ([place]) 07/08/2019  . Osteomyelitis ([place]) 06/21/2019  . NICM (nonischemic cardiomyopathy) ([place]) 06/20/2019  . Non-healing ulcer ([place]) 06/20/2019  . Acute CHF (congestive heart failure) ([place]) 11/26/2018  . CHF (congestive heart failure) ([place]) 11/26/2018  . Acute respiratory failure ([place]) 10/21/2018  . AKI (acute kidney injury) ([place]) 08/18/2018  . Coagulation disorder ([place]) 08/09/2017  . Depression 07/21/2017  . At risk for adverse drug reaction 06/20/2017  . Peripheral neuropathy 06/20/2017  . Acute osteomyelitis of right foot ([place]) 06/13/2017   . S/P transmetatarsal amputation of foot, right ([place]) 06/05/2017  . 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[place])   . SIRS (systemic inflammatory response syndrome) ([place]) 04/06/2017  . CKD (chronic kidney disease), stage III ([place]) 11/24/2016  . Suspected sleep apnea 11/24/2016  . Ulcer of toe of right foot, with necrosis of bone ([place]) 10/27/2016  . Ulcer of left lower leg ([place]) 05/19/2016  . Severe obesity (BMI >= 40) ([place]) 02/24/2016  . COPD GOLD 0  02/24/2016  . Morbid obesity ([place]) 02/24/2016  . Encounter for therapeutic drug monitoring 02/10/2016  . Symptomatic bradycardia 01/12/2016  . Essential hypertension 12/22/2015  . Chronic combined systolic and diastolic CHF (congestive heart failure) ([place])   . Wheeze   . 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 Tobacco abuse 10/23/2015  . Coronary artery disease   . DOE (dyspnea on exertion) 04/29/2015  . Diabetes mellitus type 2, uncontrolled ([place]) 02/08/2015  . Influenza A 02/07/2015  . Wrist fracture, left, with routine healing, subsequent encounter 02/05/2015  . Wrist fracture, left, closed, initial encounter 01/29/2015  . PAF (paroxysmal atrial fibrillation) ([place]) 01/16/2015  . Carotid arterial disease ([place]) 01/16/2015  . Claudication ([place]) 01/15/2015  . Demand ischemia ([place]) 10/29/2014  . Insomnia 02/03/2014  . COPD with acute exacerbation ([place]) 02/01/2014  . S/P peripheral artery angioplasty - TurboHawk atherectomy; R SFA 09/11/2013    Class: Acute  . Leg pain, bilateral 08/19/2013  . Hypothyroidism 07/31/2013  . Cellulitis 06/13/2013  . History of cocaine abuse ([place]) 06/13/2013  . Long term current use of anticoagulant therapy 05/20/2013  . Alcohol abuse   .  Narcotic abuse ([place])   . Marijuana abuse   . Alcoholic cirrhosis ([place])   . DM (diabetes mellitus), type 2 with peripheral vascular complications ([name])      Current Outpatient Medications:  .  ACCU-CHEK GUIDE test strip, USE TO CHECK BLOOD SUGAR FOUR TIMES DAILY, Disp: , Rfl:  .  albuterol (VENTOLIN HFA) 108 (90 Base) MCG/ACT inhaler, Inhale 2 puffs into the lungs every 6 (six) hours as needed for wheezing or shortness of breath., Disp: 18 g, Rfl: 0 .  amiodarone (PACERONE) 200 MG tablet, Take 0.5 tablets (100 mg total) by mouth daily., Disp: 45 tablet, Rfl: 3 .  amLODipine (NORVASC) 10 MG tablet, Take 1 tablet (10 mg total) by mouth daily., Disp: 90 tablet, Rfl: 3 .  apixaban (ELIQUIS) 5 MG TABS tablet, TAKE 1 TABLET(5 MG) BY MOUTH TWICE DAILY, Disp: 180 tablet, Rfl: 3 .  atorvastatin (LIPITOR) 40 MG tablet, Take 40 mg by mouth every evening., Disp: , Rfl:  .  bisoprolol (ZEBETA) 5 MG tablet, Take 1 tablet (5 mg total) by mouth daily., Disp: 90 tablet, Rfl: 3 .  budesonide-formoterol (SYMBICORT) 160-4.5 MCG/ACT inhaler, Inhale 2 puffs into the lungs 2 (two) times daily., Disp: 1 Inhaler, Rfl: 2 .  colchicine 0.6 MG tablet, Take 0.6 mg by mouth daily., Disp: , Rfl:  .  cyclobenzaprine (FLEXERIL) 10 MG tablet, Take 1 tablet by mouth at bedtime., Disp: , Rfl:  .  Dulaglutide (TRULICITY [place]), Inject A999333 mg into the skin once a week., Disp: , Rfl:  .  FLUoxetine (PROZAC) 10 MG tablet, Take 10 mg by mouth at bedtime., Disp: , Rfl:  .  folic acid (FOLVITE) 1 MG tablet, Take 1 tablet (1 mg total) by mouth daily., Disp: 30 tablet, Rfl: 0 .  hydrALAZINE (APRESOLINE) 25 MG tablet, Take 1 tablet (25 mg total) by mouth 3 (three) times daily., Disp: 90 tablet, Rfl: 11 .  insulin aspart (NOVOLOG) 100 UNIT/ML FlexPen, INJECT 8 UNITS INTO THE SKIN THREE TIMES DAILY WITH MEALS., Disp: 15 mL, Rfl: 0 .  insulin glargine (LANTUS) 100 UNIT/ML Solostar Pen, INJECT 20 UNITS INTO THE SKIN DAILY., Disp: 15 mL, Rfl: 0 .  insulin lispro (HUMALOG) 100 UNIT/ML KwikPen, Inject 8 Units into the skin 3 (three) times daily with meals., Disp: 15 mL, Rfl: 0 .  Insulin Pen Needle  32G X 4 MM MISC, USE AS DIRECTED WITH INSULIN PENS, Disp: 100 each, Rfl: 0 .  levothyroxine (SYNTHROID, LEVOTHROID) 50 MCG tablet, Take 1 tablet (50 mcg total) by mouth daily., Disp: 30 tablet, Rfl: 0 .  losartan (COZAAR) 100 MG tablet, Take 1 tablet (100 mg total) by mouth daily. (Patient taking differently: Take 100 mg by mouth at bedtime.), Disp: 90 tablet, Rfl: 3 .  methocarbamol (ROBAXIN) 500 MG tablet, Take 1 tablet (500 mg total) by mouth every 8 (eight) hours as needed for muscle spasms., Disp: 30 tablet, Rfl: 0 .  Multiple Vitamins-Minerals (VITAMIN D3 COMPLETE PO), Take 1 capsule by mouth daily in the afternoon., Disp: , Rfl:  .  nortriptyline (PAMELOR) 50 MG capsule, Take 50 mg by mouth 2 (two) times daily., Disp: , Rfl:  .  omega-3 fish oil (MAXEPA) 1000 MG CAPS capsule, Take 1 capsule by mouth daily., Disp: , Rfl:  .  pantoprazole (PROTONIX) 40 MG tablet, Take 1 tablet (40 mg total) by mouth daily., Disp:  , Rfl:  .  torsemide (DEMADEX) 20 MG tablet, Take 4 tablets (80 mg total) by mouth every morning  AND 3 tablets (60 mg total) every evening. (Patient taking differently: Take 4 tablets (80 mg total) by mouth every morning AND 4 tablets (80 mg total) every evening.), Disp: 210 tablet, Rfl: 11 .  empagliflozin (JARDIANCE) 10 MG TABS tablet, Take 1 tablet (10 mg total) by mouth daily before breakfast., Disp: 90 tablet, Rfl: 3 Allergies  Allergen Reactions  . Gabapentin Nausea And Vomiting and Other (See Comments)    POSSIBLE SHAKING? TAKING MED CURRENTLY  . Lyrica [Pregabalin] Other (See Comments)    Shaking Pt still taking lyrica--"probably will stop taking"       Social History   Socioeconomic History  . Marital status: Divorced    Spouse name: Not on file  . Number of children: 1  . Years of education: 3  . Highest education level: 12th grade  Occupational History  . Occupation: disabled  Tobacco Use  . Smoking status: Current Some Day Smoker    Packs/day: 1.00     Years: 44.00    Pack years: 44.00    Types: E-cigarettes, Cigarettes  . Smokeless tobacco: Former Systems developer    Types: Snuff  Vaping Use  . Vaping Use: Former  . Devices: 11/26/2018 "stopped months ago"  Substance and Sexual Activity  . Alcohol use: Yes    Comment: occ  . Drug use: Yes    Types: "Crack" cocaine, Marijuana, Oxycodone  . Sexual activity: Not Currently  Other Topics Concern  . Not on file  Social History Narrative   Lives in [place], in [place] with sister.  They are looking to move but don't have a place to go yet.     Social Determinants of Health   Financial Resource Strain: Low Risk   . Difficulty of Paying Living Expenses: Not very hard  Food Insecurity: Not on file  Transportation Needs: Not on file  Physical Activity: Not on file  Stress: Not on file  Social Connections: Not on file  Intimate Partner Violence: Not on file    Physical Exam      Future Appointments  Date Time Provider [place]  04/01/2021  2:45 PM [facility] LAB [facility] None  04/05/2021  3:00 PM Ricard Dillon, MD [facility]  04/07/2021  1:45 PM [name], MD [facility]  04/20/2021  3:45 PM Gardiner Barefoot, DPM [facility]  04/27/2021  3:00 PM [facility] PA/NP [facility] None    BP (!) 160/74   Pulse 72   Resp 20   Wt 220 lb (99.8 kg)   LMP 11/27/2012   SpO2 97%   BMI 37.76 kg/m  CBG -239 Weight yesterday-?? Last visit weight-225 @ clinic  Pt was re-referred again to paramedicine due to not taking meds appropriately, having multiple med bottles. She is just waking up when I arrived. No meds yet this morning.  She reports taking her meds last night.  Forgot to weigh yesterday.  She reports she does feel better since the increased torsemide.   meds reviewed- -seen a note regarding stopping the plavix but still in her list. -will check on that as well.  -she doesn't have the trulicity -she does not have the jardiance-looks like back in feb or mar it was  d/c will need to check on that.  meds checked-her sister fills her pill box. She had the atorvastatin in there BID not once a day. That was fixed. Advised her to take  [name] called while I was here and her cardiomems yesterday was 27.  She should be  on the jardiance-will resend.  Regarding the plavix-amy is going to check with dr Aundra Dubin- stop the plavix.  cardiomems today is 31.  Family will get that jardiance today and that way she can start it today.  B/p elevated-she has not taken meds yet today.  She ate egg rolls for dinner last night. They have been using pink sea salt thinking it was better than white salt-advised her it was not better. Sister reports she normally cooks for [employer] but since she had surgery the other day [name] has been eating out more.    Marylouise Stacks, [facility] Paramedic  04/01/21

## 2021-04-01 NOTE — Telephone Encounter (Signed)
   Cardiomems Reading 31 Goal 20  Reviewed meds with HF Paramedic [name].   She has not take jardiance since February. Instructed to restart jardiance 10 mg daily today.   Cal stop plavix. No recent stent with in the last 12 months.   Also discussed low salt food choices.   Karla Cunanan NP-C  10:12 AM

## 2021-04-05 ENCOUNTER — Encounter (HOSPITAL_BASED_OUTPATIENT_CLINIC_OR_DEPARTMENT_OTHER): Payer: Medicare Other | Admitting: Internal Medicine

## 2021-04-05 ENCOUNTER — Other Ambulatory Visit: Payer: Self-pay

## 2021-04-05 DIAGNOSIS — E11621 Type 2 diabetes mellitus with foot ulcer: Secondary | ICD-10-CM | POA: Diagnosis not present

## 2021-04-05 NOTE — Progress Notes (Signed)
**Note Karla-Identified via Obfuscation** [name] ([id number]) Visit Report for 04/05/2021 Arrival Information Details Patient Name: Date of Service: [name] 04/05/2021 3:00 PM Medical Record Number: [id number] Patient Account Number: [id number] Date of Birth/Sex: Treating RN: 1961/03/25 (60 y.o. [name], [name] Primary Care [name]: SYSTEM, PRO V IDER Other Clinician: Referring [name]: Treating [name]/Extender: [name] in Treatment: 6 Visit Information History Since Last Visit Added or deleted any medications: No Patient Arrived: [name] Any new allergies or adverse reactions: No Arrival Time: 16:27 Had a fall or experienced change in No Accompanied By: self activities of daily living that may affect Transfer Assistance: None risk of falls: Patient Identification Verified: Yes Signs or symptoms of abuse/neglect since last visito No Secondary Verification Process Completed: Yes Hospitalized since last visit: No Patient Requires Transmission-Based Precautions: No Implantable device outside of the clinic excluding No Patient Has Alerts: No cellular tissue based products placed in the center since last visit: Has Dressing in Place as Prescribed: Yes Pain Present Now: No Electronic Signature(s) Signed: 04/05/2021 5:30:27 PM By: [name] RN Entered By: [name] on 04/05/2021 16:28:47 -------------------------------------------------------------------------------- Clinic Level of Care Assessment Details Patient Name: Date of Service: [name], DO Karla S. 04/05/2021 3:00 PM Medical Record Number: [id number] Patient Account Number: [id number] Date of Birth/Sex: Treating RN: [birth year] (60 y.o. [name], [name] Primary Care Jaisean Monteforte: SYSTEM, PRO V IDER Other Clinician: Referring [name]: Treating [name]/Extender: [name] in Treatment: 6 Clinic Level of Care Assessment Items TOOL 4 Quantity Score X- 1 0 Use when only an EandM is performed on  FOLLOW-UP visit ASSESSMENTS - Nursing Assessment / Reassessment X- 1 10 Reassessment of Co-morbidities (includes updates in patient status) X- 1 5 Reassessment of Adherence to Treatment Plan ASSESSMENTS - Wound and Skin A ssessment / Reassessment X - Simple Wound Assessment / Reassessment - one wound 1 5 '[]'$  - 0 Complex Wound Assessment / Reassessment - multiple wounds X- 1 10 Dermatologic / Skin Assessment (not related to wound area) ASSESSMENTS - Focused Assessment '[]'$  - 0 Circumferential Edema Measurements - multi extremities '[]'$  - 0 Nutritional Assessment / Counseling / Intervention '[]'$  - 0 Lower Extremity Assessment (monofilament, tuning fork, pulses) '[]'$  - 0 Peripheral Arterial Disease Assessment (using hand held doppler) ASSESSMENTS - Ostomy and/or Continence Assessment and Care '[]'$  - 0 Incontinence Assessment and Management '[]'$  - 0 Ostomy Care Assessment and Management (repouching, etc.) PROCESS - Coordination of Care X - Simple Patient / Family Education for ongoing care 1 15 '[]'$  - 0 Complex (extensive) Patient / Family Education for ongoing care X- 1 10 Staff obtains Programmer, systems, Records, T Results / Process Orders est '[]'$  - 0 Staff telephones HHA, Nursing Homes / Clarify orders / etc '[]'$  - 0 Routine Transfer to another Facility (non-emergent condition) '[]'$  - 0 Routine Hospital Admission (non-emergent condition) '[]'$  - 0 New Admissions / Biomedical engineer / Ordering NPWT Apligraf, etc. , '[]'$  - 0 Emergency Hospital Admission (emergent condition) X- 1 10 Simple Discharge Coordination '[]'$  - 0 Complex (extensive) Discharge Coordination PROCESS - Special Needs '[]'$  - 0 Pediatric / Minor Patient Management '[]'$  - 0 Isolation Patient Management '[]'$  - 0 Hearing / Language / Visual special needs '[]'$  - 0 Assessment of Community assistance (transportation, D/C planning, etc.) '[]'$  - 0 Additional assistance / Altered mentation '[]'$  - 0 Support Surface(s) Assessment (bed, cushion,  seat, etc.) INTERVENTIONS - Wound Cleansing / Measurement '[]'$  - 0 Simple Wound Cleansing - one wound X- 2 5 Complex Wound Cleansing - multiple wounds '[]'$  -  0 Wound Imaging (photographs - any number of wounds) '[]'$  - 0 Wound Tracing (instead of photographs) '[]'$  - 0 Simple Wound Measurement - one wound X- 2 5 Complex Wound Measurement - multiple wounds INTERVENTIONS - Wound Dressings '[]'$  - 0 Small Wound Dressing one or multiple wounds X- 2 15 Medium Wound Dressing one or multiple wounds '[]'$  - 0 Large Wound Dressing one or multiple wounds '[]'$  - 0 Application of Medications - topical '[]'$  - 0 Application of Medications - injection INTERVENTIONS - Miscellaneous '[]'$  - 0 External ear exam '[]'$  - 0 Specimen Collection (cultures, biopsies, blood, body fluids, etc.) '[]'$  - 0 Specimen(s) / Culture(s) sent or taken to Lab for analysis '[]'$  - 0 Patient Transfer (multiple staff / Civil Service fast streamer / Similar devices) '[]'$  - 0 Simple Staple / Suture removal (25 or less) '[]'$  - 0 Complex Staple / Suture removal (26 or more) '[]'$  - 0 Hypo / Hyperglycemic Management (close monitor of Blood Glucose) '[]'$  - 0 Ankle / Brachial Index (ABI) - do not check if billed separately X- 1 5 Vital Signs Has the patient been seen at the hospital within the last three years: Yes Total Score: 120 Level Of Care: New/Established - Level 4 Electronic Signature(s) Signed: 04/05/2021 5:30:27 PM By: [name] RN Entered By: [name] on 04/05/2021 16:32:01 -------------------------------------------------------------------------------- Encounter Discharge Information Details Patient Name: Date of Service: Karla Corwin, DO Karla S. 04/05/2021 3:00 PM Medical Record Number: [id number] Patient Account Number: [id number] Date of Birth/Sex: Treating RN: 05/10/1961 (60 y.o. [name] Primary Care [name]: SYSTEM, PRO V IDER Other Clinician: Referring [name]: Treating [name]/Extender: [name] in  Treatment: 6 Encounter Discharge Information Items Discharge Condition: Stable Ambulatory Status: [name] Discharge Destination: Home Transportation: Private Auto Accompanied By: self Schedule Follow-up Appointment: Yes Clinical Summary of Care: Patient Declined Electronic Signature(s) Signed: 04/05/2021 5:30:27 PM By: [name] RN Entered By: [name] on 04/05/2021 16:30:29 -------------------------------------------------------------------------------- Patient/Caregiver Education Details Patient Name: Date of Service: [name] 5/23/2022andnbsp3:00 PM Medical Record Number: [id number] Patient Account Number: [id number] Date of Birth/Gender: Treating RN: 1961/11/11 (60 y.o. [name] Primary Care Physician: SYSTEM, PRO V IDER Other Clinician: Referring Physician: Treating Physician/Extender: [name] in Treatment: 6 Education Assessment Education Provided To: Patient Education Topics Provided Wound/Skin Impairment: Methods: Explain/Verbal Responses: State content correctly Electronic Signature(s) Signed: 04/05/2021 5:30:27 PM By: [name] RN Entered By: [name] on 04/05/2021 16:30:17 -------------------------------------------------------------------------------- Wound Assessment Details Patient Name: Date of Service: Karla Corwin, DO Karla S. 04/05/2021 3:00 PM Medical Record Number: [id number] Patient Account Number: [id number] Date of Birth/Sex: Treating RN: 24-Nov-1960 (60 y.o. [name], [name] Primary Care Orvis Stann: SYSTEM, PRO V IDER Other Clinician: Referring Dimitrios Balestrieri: Treating Nisa Decaire/Extender: [name] in Treatment: 6 Wound Status Wound Number: 16 Primary Etiology: Diabetic Wound/Ulcer of the Lower Extremity Wound Location: Right, Plantar Amputation Site - Transmetatarsal Wound Status: Open Wounding Event: Gradually Appeared Date Acquired: 02/05/2021 Weeks Of Treatment: 6 Clustered  Wound: No Wound Measurements Length: (cm) 1 Width: (cm) 1.4 Depth: (cm) 0.7 Area: (cm) 1.1 Volume: (cm) 0.77 % Reduction in Area: 32.7% % Reduction in Volume: -57.1% Wound Description Classification: Grade 2 Treatment Notes 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 Peri-Wound Care Topical Primary Dressing IODOFLEX 0.9% Cadexomer Iodine Pad 4x6 cm Discharge Instruction: Apply to wound bed as instructed Secondary Dressing Woven Gauze  Sponge, Non-Sterile 4x4 in Discharge Instruction: Apply over primary dressing as directed. Secured With Compression Wrap Kerlix Roll 4.5x3.1 (in/yd) Discharge Instruction: Apply Kerlix and Coban compression as directed. Coban Self-Adherent Wrap 4x5 (in/yd) Discharge Instruction: Apply over Kerlix as directed. Compression Stockings Add-Ons Electronic Signature(s) Signed: 04/05/2021 5:30:27 PM By: [name] RN Entered By: [name] on 04/05/2021 16:29:34 -------------------------------------------------------------------------------- Wound Assessment Details Patient Name: Date of Service: Karla Corwin, DO Karla S. 04/05/2021 3:00 PM Medical Record Number: [id number] Patient Account Number: [id number] Date of Birth/Sex: Treating RN: 02/08/1961 (60 y.o. [name], [name] Primary Care Quanah Majka: SYSTEM, PRO V IDER Other Clinician: Referring Samone Guhl: Treating Safaa Stingley/Extender: [name] in Treatment: 6 Wound Status Wound Number: 19 Primary Etiology: Skin Tear Wound Location: Right Upper Arm Wound Status: Open Wounding Event: Skin Tear/Laceration Date Acquired: 03/27/2021 Weeks Of Treatment: 1 Clustered Wound: No Wound Measurements Length: (cm) 4.2 Width: (cm) 1.6 Depth: (cm) 0.1 Area: (cm) 5.278 Volume: (cm) 0.528 % Reduction in Area:  0% % Reduction in Volume: 0% Wound Description Classification: Full Thickness Without Exposed Support Structur es Treatment Notes Wound #19 (Upper Arm) Wound Laterality: Right Cleanser Soap and Water Discharge Instruction: May shower and wash wound with dial antibacterial soap and water prior to dressing change. Peri-Wound Care Topical Primary Dressing Xeroform Occlusive Gauze Dressing, 4x4 in Discharge Instruction: Apply to wound bed as instructed Secondary Dressing Woven Gauze Sponge, Non-Sterile 4x4 in Discharge Instruction: Apply over primary dressing as directed. Secured With [employer], 4.5x3.1 (in/yd) Discharge Instruction: Secure with Kerlix as directed. Paper Tape, 2x10 (in/yd) Discharge Instruction: Secure dressing with tape as directed. Compression Wrap Compression Stockings Add-Ons Electronic Signature(s) Signed: 04/05/2021 5:30:27 PM By: [name] RN Entered By: [name] on 04/05/2021 16:29:35 -------------------------------------------------------------------------------- Vitals Details Patient Name: Date of Service: Karla Corwin, DO Karla S. 04/05/2021 3:00 PM Medical Record Number: [id number] Patient Account Number: [id number] Date of Birth/Sex: Treating RN: Nov 05, 1961 (60 y.o. [name], [name] Primary Care Harutyun Monteverde: SYSTEM, PRO V IDER Other Clinician: Referring Janiene Aarons: Treating Jamaira Sherk/Extender: [name] in Treatment: 6 Vital Signs Time Taken: 16:28 Temperature (F): 97.8 Pulse (bpm): 74 Respiratory Rate (breaths/min): 17 Blood Pressure (mmHg): 98/64 Capillary Blood Glucose (mg/dl): 245 Reference Range: 80 - 120 mg / dl Electronic Signature(s) Signed: 04/05/2021 5:30:27 PM By: [name] RN Entered By: [name] on 04/05/2021 16:29:15

## 2021-04-07 ENCOUNTER — Ambulatory Visit: Payer: Medicare Other | Admitting: Cardiovascular Disease

## 2021-04-08 ENCOUNTER — Other Ambulatory Visit (HOSPITAL_COMMUNITY): Payer: Self-pay

## 2021-04-08 NOTE — Progress Notes (Signed)
Paramedicine Encounter    Patient ID: [name], female    DOB: September 30, 1961, 60 y.o.   MRN: [id number]   Patient Care Team: System, Provider Not In as PCP - General Gwenlyn Found [name], MD as PCP - Cardiology (Cardiology) [name], MD as PCP - Electrophysiology (Cardiology) [name], MD as Consulting Physician (Cardiology) [name], MD as Consulting Physician (Pulmonary Disease)  Patient Active Problem List   Diagnosis Date Noted  . Ulcer of foot, unspecified laterality, limited to breakdown of skin ([place]) 02/17/2021  . Altered mental status 01/02/2021  . AF (paroxysmal atrial fibrillation) ([place]) 01/02/2021  . CAD (coronary artery disease) 01/02/2021  . PVD (peripheral vascular disease) ([place]) 01/02/2021  . Acute renal failure superimposed on stage 4 chronic kidney disease ([place]) 01/02/2021  . Diabetes mellitus type 2, uncontrolled, with complications ([place]) [id number]  . Diabetic nephropathy ([place]) 01/02/2021  . Low back pain 06/01/2020  . Hyperlipidemia 04/03/2020  . Muscle weakness 03/20/2020  . Closed fracture of neck of right radius 03/13/2020  . Pain in elbow 03/12/2020  . PAD (peripheral artery disease) ([place]) 12/26/2019  . Acute renal failure ([place])   . Acute respiratory failure with hypoxia ([place]) 12/02/2019  . Acute exacerbation of CHF (congestive heart failure) ([place]) 12/01/2019  . Cellulitis in diabetic foot ([place]) 07/08/2019  . Osteomyelitis ([place]) 06/21/2019  . NICM (nonischemic cardiomyopathy) ([place]) 06/20/2019  . Non-healing ulcer ([place]) 06/20/2019  . Acute CHF (congestive heart failure) ([place]) 11/26/2018  . CHF (congestive heart failure) ([place]) 11/26/2018  . Acute respiratory failure ([place]) 10/21/2018  . AKI (acute kidney injury) ([place]) 08/18/2018  . Coagulation disorder ([place]) 08/09/2017  . Depression 07/21/2017  . At risk for adverse drug reaction 06/20/2017  . Peripheral neuropathy 06/20/2017  . Acute osteomyelitis of right foot ([place]) 06/13/2017   . S/P transmetatarsal amputation of foot, right ([place]) 06/05/2017  . 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[place])   . SIRS (systemic inflammatory response syndrome) ([place]) 04/06/2017  . CKD (chronic kidney disease), stage III ([place]) 11/24/2016  . Suspected sleep apnea 11/24/2016  . Ulcer of toe of right foot, with necrosis of bone ([place]) 10/27/2016  . Ulcer of left lower leg ([place]) 05/19/2016  . Severe obesity (BMI >= 40) ([place]) 02/24/2016  . COPD GOLD 0  02/24/2016  . Morbid obesity ([place]) 02/24/2016  . Encounter for therapeutic drug monitoring 02/10/2016  . Symptomatic bradycardia 01/12/2016  . Essential hypertension 12/22/2015  . Chronic combined systolic and diastolic CHF (congestive heart failure) ([place])   . Wheeze   . 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 Tobacco abuse 10/23/2015  . Coronary artery disease   . DOE (dyspnea on exertion) 04/29/2015  . Diabetes mellitus type 2, uncontrolled ([place]) 02/08/2015  . Influenza A 02/07/2015  . Wrist fracture, left, with routine healing, subsequent encounter 02/05/2015  . Wrist fracture, left, closed, initial encounter 01/29/2015  . PAF (paroxysmal atrial fibrillation) ([place]) 01/16/2015  . Carotid arterial disease ([place]) 01/16/2015  . Claudication ([place]) 01/15/2015  . Demand ischemia ([place]) 10/29/2014  . Insomnia 02/03/2014  . COPD with acute exacerbation ([place]) 02/01/2014  . S/P peripheral artery angioplasty - TurboHawk atherectomy; R SFA 09/11/2013    Class: Acute  . Leg pain, bilateral 08/19/2013  . Hypothyroidism 07/31/2013  . Cellulitis 06/13/2013  . History of cocaine abuse ([place]) 06/13/2013  . Long term current use of anticoagulant therapy 05/20/2013  . Alcohol abuse   .  Narcotic abuse ([place])   . Marijuana abuse   . Alcoholic cirrhosis ([place]rescent Beach)   . DM (diabetes mellitus), type 2 with peripheral vascular complications ([name])      Current Outpatient Medications:  .  ACCU-CHEK GUIDE test strip, USE TO CHECK BLOOD SUGAR FOUR TIMES DAILY, Disp: , Rfl:  .  albuterol (VENTOLIN HFA) 108 (90 Base) MCG/ACT inhaler, Inhale 2 puffs into the lungs every 6 (six) hours as needed for wheezing or shortness of breath., Disp: 18 g, Rfl: 0 .  amiodarone (PACERONE) 200 MG tablet, Take 0.5 tablets (100 mg total) by mouth daily., Disp: 45 tablet, Rfl: 3 .  amLODipine (NORVASC) 10 MG tablet, Take 1 tablet (10 mg total) by mouth daily., Disp: 90 tablet, Rfl: 3 .  apixaban (ELIQUIS) 5 MG TABS tablet, TAKE 1 TABLET(5 MG) BY MOUTH TWICE DAILY, Disp: 180 tablet, Rfl: 3 .  atorvastatin (LIPITOR) 40 MG tablet, Take 40 mg by mouth every evening., Disp: , Rfl:  .  bisoprolol (ZEBETA) 5 MG tablet, Take 1 tablet (5 mg total) by mouth daily., Disp: 90 tablet, Rfl: 3 .  budesonide-formoterol (SYMBICORT) 160-4.5 MCG/ACT inhaler, Inhale 2 puffs into the lungs 2 (two) times daily., Disp: 1 Inhaler, Rfl: 2 .  colchicine 0.6 MG tablet, Take 0.6 mg by mouth daily., Disp: , Rfl:  .  Dulaglutide (TRULICITY Joanna), Inject A999333 mg into the skin once a week., Disp: , Rfl:  .  empagliflozin (JARDIANCE) 10 MG TABS tablet, Take 1 tablet (10 mg total) by mouth daily before breakfast., Disp: 90 tablet, Rfl: 3 .  FLUoxetine (PROZAC) 10 MG tablet, Take 10 mg by mouth at bedtime., Disp: , Rfl:  .  folic acid (FOLVITE) 1 MG tablet, Take 1 tablet (1 mg total) by mouth daily., Disp: 30 tablet, Rfl: 0 .  hydrALAZINE (APRESOLINE) 25 MG tablet, Take 1 tablet (25 mg total) by mouth 3 (three) times daily., Disp: 90 tablet, Rfl: 11 .  insulin aspart (NOVOLOG) 100 UNIT/ML FlexPen, INJECT 8 UNITS INTO THE SKIN THREE TIMES DAILY WITH MEALS., Disp: 15 mL, Rfl: 0 .  insulin glargine (LANTUS) 100 UNIT/ML Solostar Pen, INJECT 20 UNITS INTO THE SKIN DAILY., Disp: 15 mL, Rfl: 0 .  insulin lispro (HUMALOG) 100 UNIT/ML KwikPen, Inject 8 Units into the skin 3 (three) times daily with meals.,  Disp: 15 mL, Rfl: 0 .  Insulin Pen Needle 32G X 4 MM MISC, USE AS DIRECTED WITH INSULIN PENS, Disp: 100 each, Rfl: 0 .  levothyroxine (SYNTHROID, LEVOTHROID) 50 MCG tablet, Take 1 tablet (50 mcg total) by mouth daily., Disp: 30 tablet, Rfl: 0 .  losartan (COZAAR) 100 MG tablet, Take 1 tablet (100 mg total) by mouth daily. (Patient taking differently: Take 100 mg by mouth at bedtime.), Disp: 90 tablet, Rfl: 3 .  methocarbamol (ROBAXIN) 500 MG tablet, Take 1 tablet (500 mg total) by mouth every 8 (eight) hours as needed for muscle spasms., Disp: 30 tablet, Rfl: 0 .  Multiple Vitamins-Minerals (VITAMIN D3 COMPLETE PO), Take 1 capsule by mouth daily in the afternoon., Disp: , Rfl:  .  nortriptyline (PAMELOR) 50 MG capsule, Take 50 mg by mouth 2 (two) times daily., Disp: , Rfl:  .  omega-3 fish oil (MAXEPA) 1000 MG CAPS capsule, Take 1 capsule by mouth daily., Disp: , Rfl:  .  pantoprazole (PROTONIX) 40 MG tablet, Take 1 tablet (40 mg total) by mouth daily., Disp:  , Rfl:  .  torsemide (DEMADEX) 20 MG tablet, Take 4 tablets (80  mg total) by mouth every morning AND 3 tablets (60 mg total) every evening. (Patient taking differently: Take 4 tablets (80 mg total) by mouth every morning AND 4 tablets (80 mg total) every evening.), Disp: 210 tablet, Rfl: 11 .  cyclobenzaprine (FLEXERIL) 10 MG tablet, Take 1 tablet by mouth at bedtime. (Patient not taking: Reported on 04/08/2021), Disp: , Rfl:  Allergies  Allergen Reactions  . Gabapentin Nausea And Vomiting and Other (See Comments)    POSSIBLE SHAKING? TAKING MED CURRENTLY  . Lyrica [Pregabalin] Other (See Comments)    Shaking Pt still taking lyrica--"probably will stop taking"       Social History   Socioeconomic History  . Marital status: Divorced    Spouse name: Not on file  . Number of children: 1  . Years of education: 40  . Highest education level: 12th grade  Occupational History  . Occupation: disabled  Tobacco Use  . Smoking status:  Current Some Day Smoker    Packs/day: 1.00    Years: 44.00    Pack years: 44.00    Types: E-cigarettes, Cigarettes  . Smokeless tobacco: Former Systems developer    Types: Snuff  Vaping Use  . Vaping Use: Former  . Devices: 11/26/2018 "stopped months ago"  Substance and Sexual Activity  . Alcohol use: Yes    Comment: occ  . Drug use: Yes    Types: "Crack" cocaine, Marijuana, Oxycodone  . Sexual activity: Not Currently  Other Topics Concern  . Not on file  Social History Narrative   Lives in [place], in [place] with sister.  They are looking to move but don't have a place to go yet.     Social Determinants of Health   Financial Resource Strain: Low Risk   . Difficulty of Paying Living Expenses: Not very hard  Food Insecurity: Not on file  Transportation Needs: Not on file  Physical Activity: Not on file  Stress: Not on file  Social Connections: Not on file  Intimate Partner Violence: Not on file    Physical Exam      Future Appointments  Date Time Provider [place]  04/13/2021  3:00 PM Valinda Party, DO [facility]  04/20/2021  3:45 PM Gardiner Barefoot, DPM [facility]  04/27/2021  3:00 PM [facility] PA/NP [facility] None  05/19/2021  9:15 AM Kroeger, Lorelee Cover., PA-C [facility]    BP 124/70   Pulse 60   Resp 18   Wt 221 lb (100.2 kg)   LMP 11/27/2012   SpO2 98%   BMI 37.93 kg/m   Weight yesterday-224 Last visit weight-220 CBG-214  Pt reports she is doing ok, she denies increased sob, her chronic cough sounds awful, no c/p, no dizziness, still smoking.  meds verified and pill box checked and corrected. Sister started to fill it but didn't complete it, so that was done.  Her weight is same as last week. Yesterday her cardiomems number was good.  Will see her next week.    [name], [place] [facility] Paramedic  04/08/21

## 2021-04-12 IMAGING — US US ABDOMEN LIMITED
1 series · 14 of 25 positions shown · non-contrast
Comparison: None.

CLINICAL DATA: Acute alcohol intoxication

EXAM:
ULTRASOUND ABDOMEN LIMITED RIGHT UPPER QUADRANT

[Series 1: us abdomen limited · 0.27mm/px · 14 of 58 slices shown]
[im 1/58]
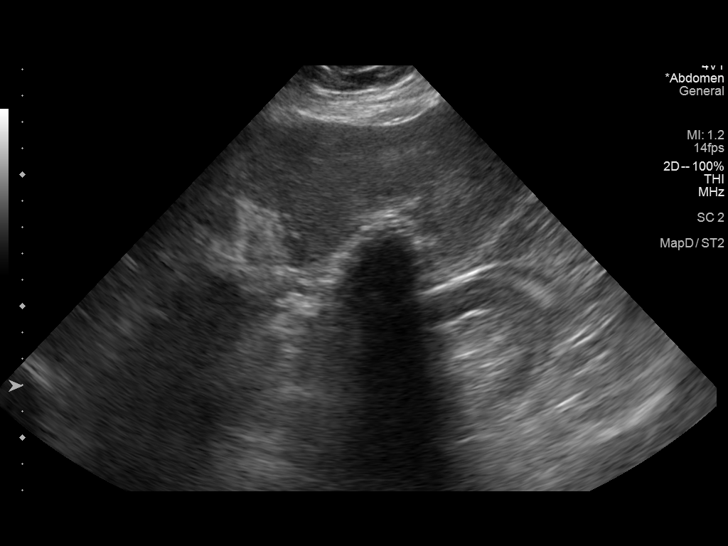
[im 5/58]
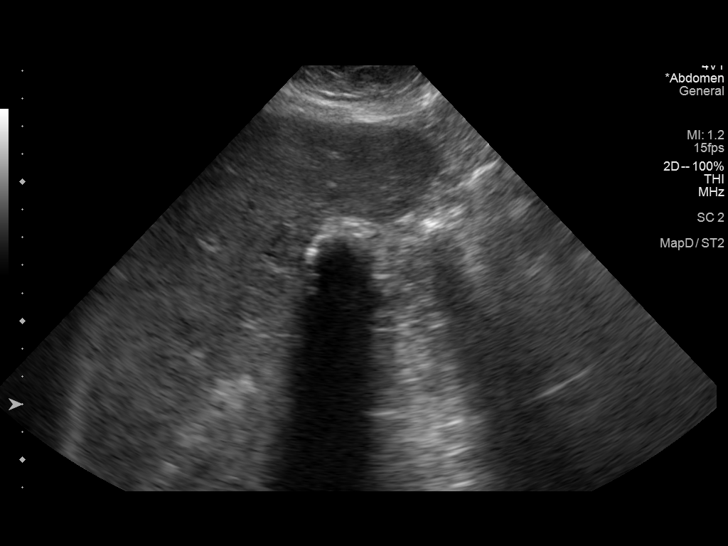
[im 10/58]
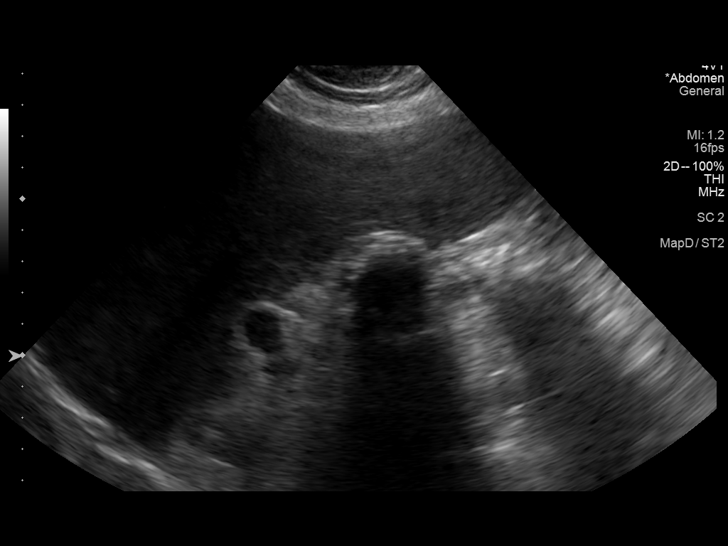
[im 15/58]
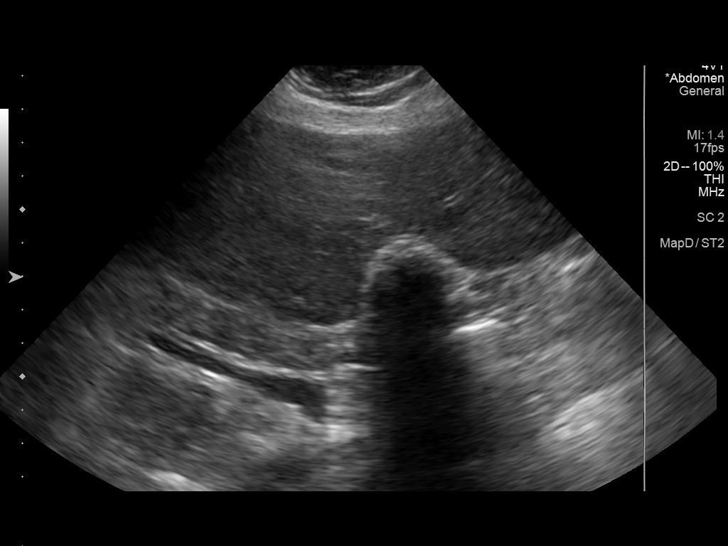
[im 20/58]
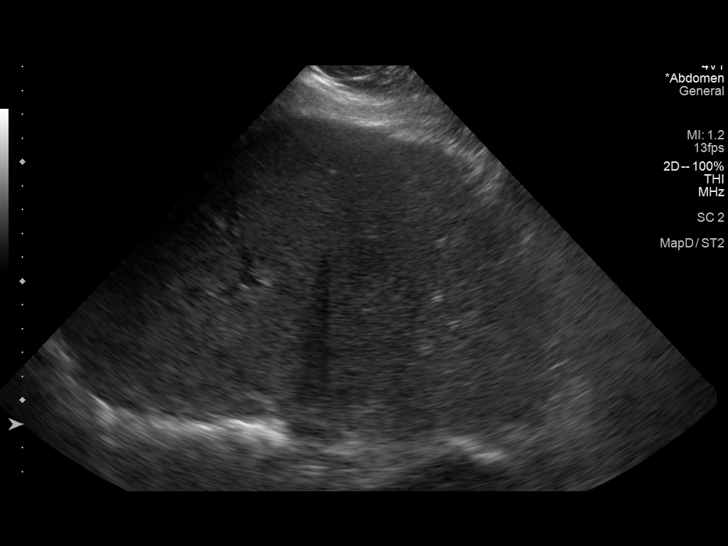
[im 22/58]
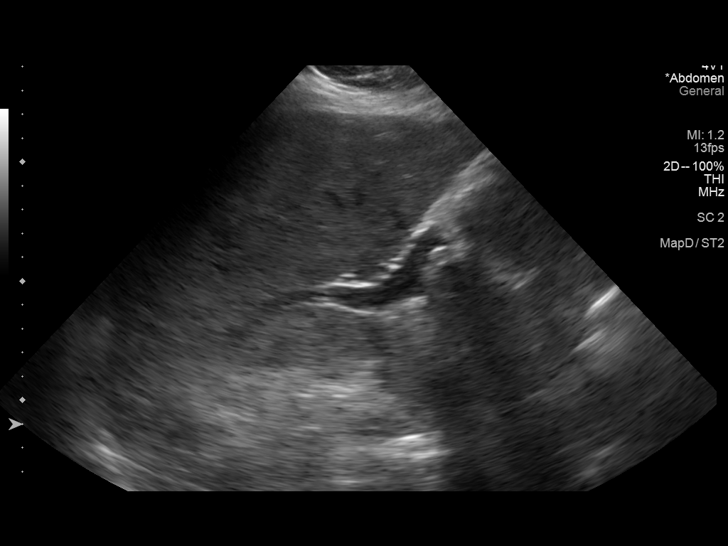
[im 27/58]
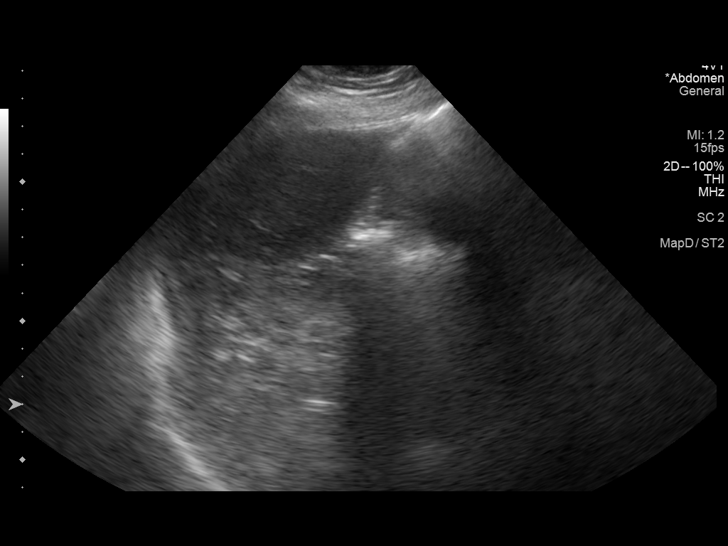
[im 31/58]
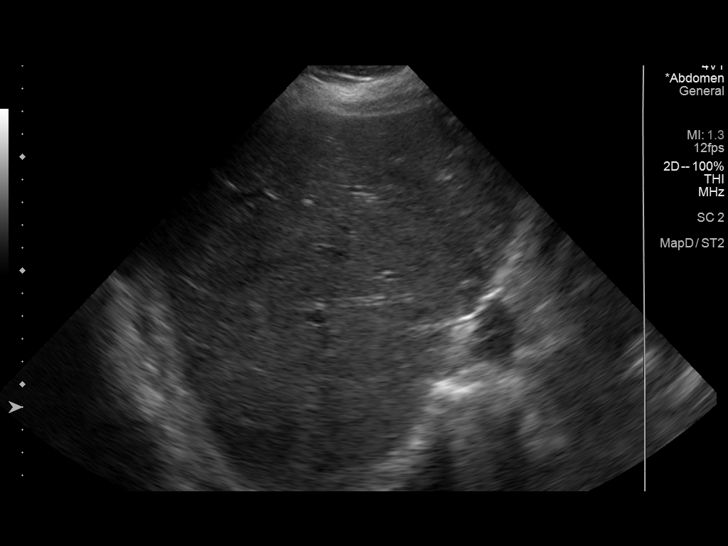
[im 36/58]
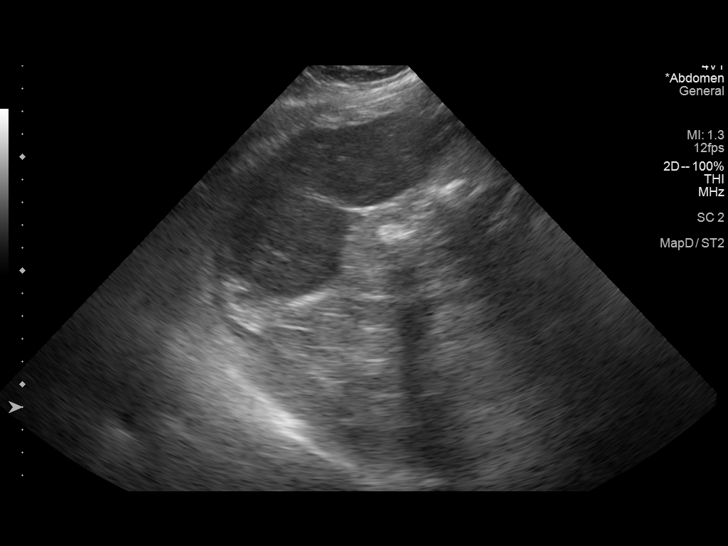
[im 39/58]
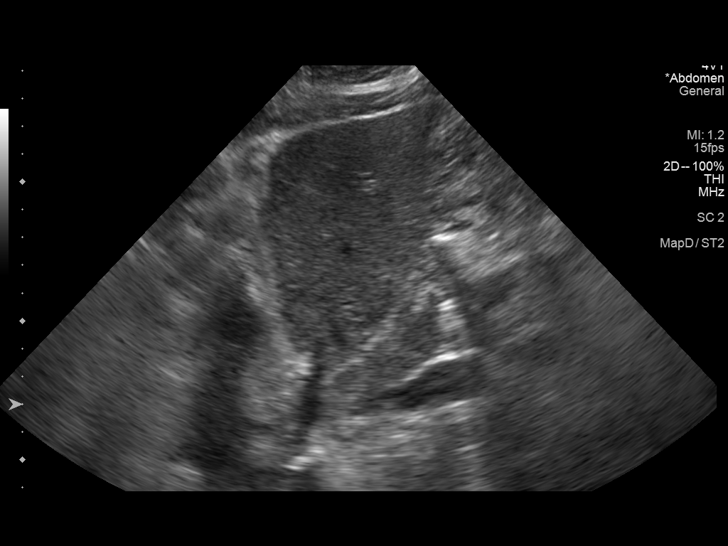
[im 43/58]
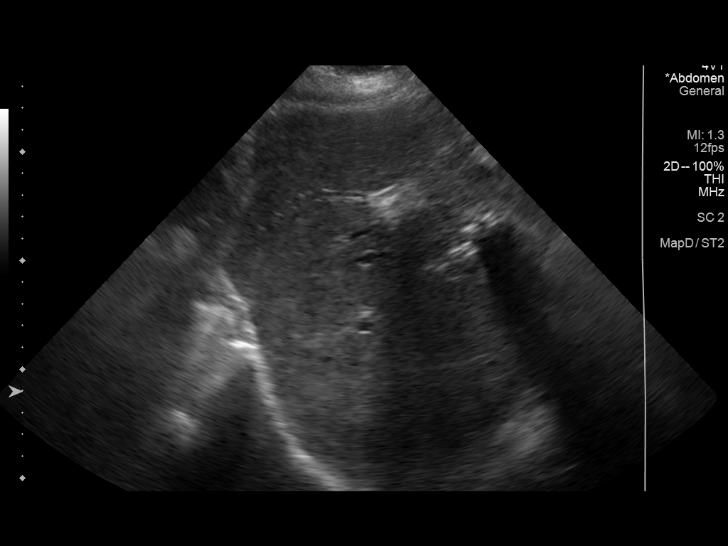
[im 48/58]
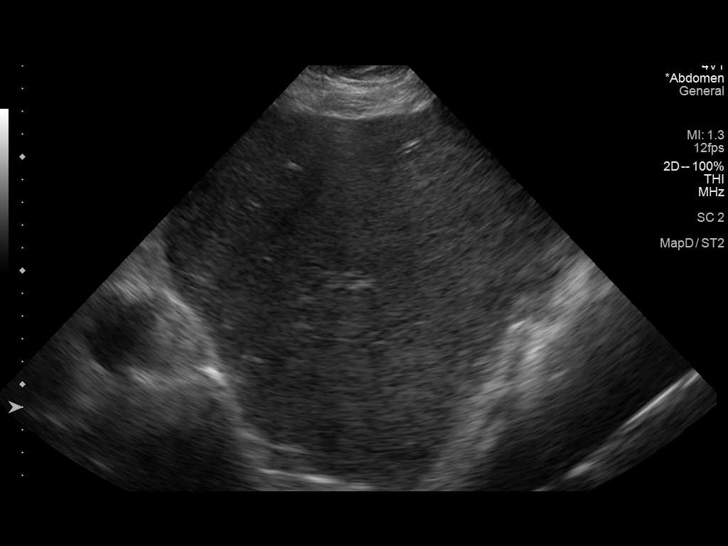
[im 53/58]
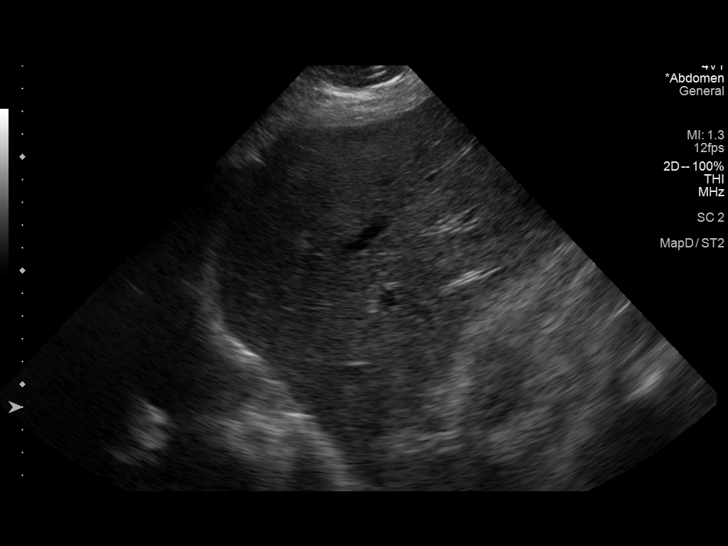
[im 58/58]
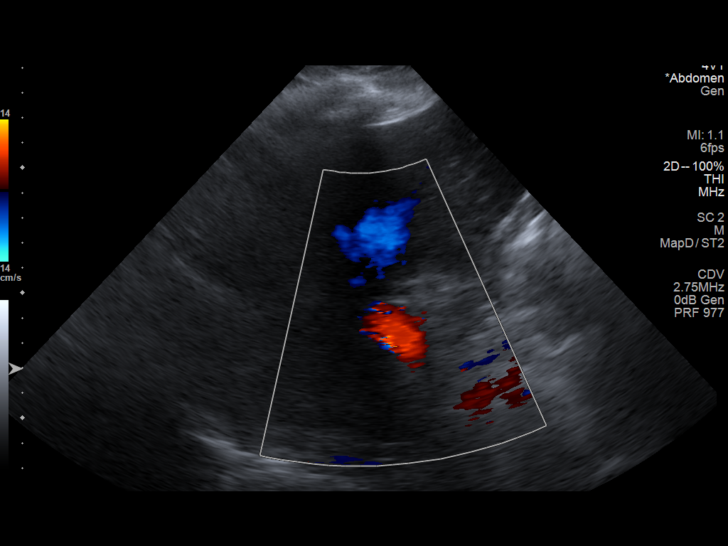

[14 of 25 positions shown; findings below may reference images not displayed]

FINDINGS: Gallbladder:

The gallbladder is contracted and stone filled in keeping with
changes of chronic cholecystitis in the appropriate clinical
setting. The sonographic Murphy sign is reportedly negative. No
pericholecystic fluid is identified.

Common bile duct:

Diameter: 4 mm in proximal to mid diameter

Liver:

No focal lesion identified. Within normal limits in parenchymal
echogenicity. Portal vein is patent on color Doppler imaging with
normal direction of blood flow towards the liver.

Other: No ascites
IMPRESSION: 1. Contracted stone filled gallbladder in keeping with changes of
chronic cholecystitis in the appropriate clinical setting.

## 2021-04-13 ENCOUNTER — Encounter (HOSPITAL_BASED_OUTPATIENT_CLINIC_OR_DEPARTMENT_OTHER): Payer: Medicare Other | Admitting: Internal Medicine

## 2021-04-14 NOTE — Progress Notes (Signed)
KELLISHA, SLONIKER ([id number]) Visit Report for 04/05/2021 SuperBill Details Patient Name: Date of Service: [name] 04/05/2021 Medical Record Number: [id number] Patient Account Number: [id number] Date of Birth/Sex: Treating RN: [birth year] (60 y.o. Tonita Phoenix, Lauren Primary Care Provider: SYSTEM, PRO V IDER Other Clinician: Referring Provider: Treating Provider/Extender: Yaakov Guthrie in Treatment: 6 Diagnosis Coding ICD-10 Codes Code Description 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.518 Non-pressure chronic ulcer of other part of right foot with other specified severity E11.51 Type 2 diabetes mellitus with diabetic peripheral angiopathy without gangrene S40.811D Abrasion of right upper arm, subsequent encounter Facility Procedures CPT4 Code Description Modifier Quantity [id number] - WOUND CARE VISIT-LEV 4 EST PT 1 Electronic Signature(s) Signed: 04/05/2021 5:30:27 PM By: [name] RN Signed: 04/14/2021 9:55:24 AM By: [name] DO Entered By: [name] on 04/05/2021 16:32:09

## 2021-04-15 ENCOUNTER — Telehealth (HOSPITAL_COMMUNITY): Payer: Self-pay | Admitting: Adult Health

## 2021-04-15 ENCOUNTER — Other Ambulatory Visit (HOSPITAL_COMMUNITY): Payer: Self-pay

## 2021-04-15 NOTE — Telephone Encounter (Signed)
Called from HF Paramedic.   SBP Sitting 90---> SBP Standing 76   Instructed to hold torsemide tonight then resume.   Wendolyn Raso Np_C  3:08 PM

## 2021-04-15 NOTE — Progress Notes (Signed)
Paramedicine Encounter    Patient ID: [name], female    DOB: 07/24/1961, 60 y.o.   MRN: [id number]   Patient Care Team: System, Provider Not In as PCP - General Gwenlyn Found [name], MD as PCP - Cardiology (Cardiology) [name], MD as PCP - Electrophysiology (Cardiology) [name], MD as Consulting Physician (Cardiology) [name], MD as Consulting Physician (Pulmonary Disease)  Patient Active Problem List   Diagnosis Date Noted  . Ulcer of foot, unspecified laterality, limited to breakdown of skin ([place]) 02/17/2021  . Altered mental status 01/02/2021  . AF (paroxysmal atrial fibrillation) (Shevlin) 01/02/2021  . CAD (coronary artery disease) 01/02/2021  . PVD (peripheral vascular disease) (Oberlin) 01/02/2021  . Acute renal failure superimposed on stage 4 chronic kidney disease ([place] Harmony) 01/02/2021  . Diabetes mellitus type 2, uncontrolled, with complications (Gretna) [id number]  . Diabetic nephropathy (Syracuse) 01/02/2021  . Low back pain 06/01/2020  . Hyperlipidemia 04/03/2020  . Muscle weakness 03/20/2020  . Closed fracture of neck of right radius 03/13/2020  . Pain in elbow 03/12/2020  . PAD (peripheral artery disease) (Teton) 12/26/2019  . Acute renal failure (Chetopa)   . Acute respiratory failure with hypoxia (Patoka) 12/02/2019  . Acute exacerbation of CHF (congestive heart failure) (Hoffman) 12/01/2019  . Cellulitis in diabetic foot (Rapides) 07/08/2019  . Osteomyelitis (Cl[place] City) 06/21/2019  . NICM (nonischemic cardiomyopathy) (Elbow Lake) 06/20/2019  . Non-healing ulcer ([place]) 06/20/2019  . Acute CHF (congestive heart failure) (Cohasset) 11/26/2018  . CHF (congestive heart failure) ([place]) 11/26/2018  . Acute respiratory failure (Hopkins) 10/21/2018  . AKI (acute kidney injury) ([place]) 08/18/2018  . Coagulation disorder ([place]enhurst) 08/09/2017  . Depression 07/21/2017  . At risk for adverse drug reaction 06/20/2017  . Peripheral neuropathy 06/20/2017  . Acute osteomyelitis of right foot (Nogal) 06/13/2017   . S/P transmetatarsal amputation of foot, right (Bridgeville) 06/05/2017  . 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[place] Bluff)   . SIRS (systemic inflammatory response syndrome) (Englewood) 04/06/2017  . CKD (chronic kidney disease), stage III ([place]) 11/24/2016  . Suspected sleep apnea 11/24/2016  . Ulcer of toe of right foot, with necrosis of bone (Tifton) 10/27/2016  . Ulcer of left lower leg (Clarkston) 05/19/2016  . Severe obesity (BMI >= 40) (Orrstown) 02/24/2016  . COPD GOLD 0  02/24/2016  . Morbid obesity (Hyamp[place]) 02/24/2016  . Encounter for therapeutic drug monitoring 02/10/2016  . Symptomatic bradycardia 01/12/2016  . Essential hypertension 12/22/2015  . Chronic combined systolic and diastolic CHF (congestive heart failure) (Maili)   . Wheeze   . 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 Tobacco abuse 10/23/2015  . Coronary artery disease   . DOE (dyspnea on exertion) 04/29/2015  . Diabetes mellitus type 2, uncontrolled ([place]) 02/08/2015  . Influenza A 02/07/2015  . Wrist fracture, left, with routine healing, subsequent encounter 02/05/2015  . Wrist fracture, left, closed, initial encounter 01/29/2015  . PAF (paroxysmal atrial fibrillation) (Ceylon) 01/16/2015  . Carotid arterial disease (Lambert) 01/16/2015  . Claudication (Waverly) 01/15/2015  . Demand ischemia (Milton) 10/29/2014  . Insomnia 02/03/2014  . COPD with acute exacerbation (Egeland) 02/01/2014  . S/P peripheral artery angioplasty - TurboHawk atherectomy; R SFA 09/11/2013    Class: Acute  . Leg pain, bilateral 08/19/2013  . Hypothyroidism 07/31/2013  . Cellulitis 06/13/2013  . History of cocaine abuse (Tualatin) 06/13/2013  . Long term current use of anticoagulant therapy 05/20/2013  . Alcohol abuse   .  Narcotic abuse (Macksburg)   . Marijuana abuse   . Alcoholic cirrhosis (Riddleville)   . DM (diabetes mellitus), type 2 with peripheral vascular complications ([name])      Current Outpatient Medications:  .  ACCU-CHEK GUIDE test strip, USE TO CHECK BLOOD SUGAR FOUR TIMES DAILY, Disp: , Rfl:  .  albuterol (VENTOLIN HFA) 108 (90 Base) MCG/ACT inhaler, Inhale 2 puffs into the lungs every 6 (six) hours as needed for wheezing or shortness of breath., Disp: 18 g, Rfl: 0 .  amiodarone (PACERONE) 200 MG tablet, Take 0.5 tablets (100 mg total) by mouth daily., Disp: 45 tablet, Rfl: 3 .  amLODipine (NORVASC) 10 MG tablet, Take 1 tablet (10 mg total) by mouth daily., Disp: 90 tablet, Rfl: 3 .  apixaban (ELIQUIS) 5 MG TABS tablet, TAKE 1 TABLET(5 MG) BY MOUTH TWICE DAILY, Disp: 180 tablet, Rfl: 3 .  atorvastatin (LIPITOR) 40 MG tablet, Take 40 mg by mouth every evening., Disp: , Rfl:  .  bisoprolol (ZEBETA) 5 MG tablet, Take 1 tablet (5 mg total) by mouth daily., Disp: 90 tablet, Rfl: 3 .  budesonide-formoterol (SYMBICORT) 160-4.5 MCG/ACT inhaler, Inhale 2 puffs into the lungs 2 (two) times daily., Disp: 1 Inhaler, Rfl: 2 .  colchicine 0.6 MG tablet, Take 0.6 mg by mouth daily., Disp: , Rfl:  .  Dulaglutide (TRULICITY [place]), Inject A999333 mg into the skin once a week., Disp: , Rfl:  .  empagliflozin (JARDIANCE) 10 MG TABS tablet, Take 1 tablet (10 mg total) by mouth daily before breakfast., Disp: 90 tablet, Rfl: 3 .  FLUoxetine (PROZAC) 10 MG tablet, Take 10 mg by mouth at bedtime., Disp: , Rfl:  .  folic acid (FOLVITE) 1 MG tablet, Take 1 tablet (1 mg total) by mouth daily., Disp: 30 tablet, Rfl: 0 .  hydrALAZINE (APRESOLINE) 25 MG tablet, Take 1 tablet (25 mg total) by mouth 3 (three) times daily., Disp: 90 tablet, Rfl: 11 .  insulin aspart (NOVOLOG) 100 UNIT/ML FlexPen, INJECT 8 UNITS INTO THE SKIN THREE TIMES DAILY WITH MEALS., Disp: 15 mL, Rfl: 0 .  insulin glargine (LANTUS) 100 UNIT/ML Solostar Pen, INJECT 20 UNITS INTO THE SKIN DAILY., Disp: 15 mL, Rfl: 0 .  insulin lispro (HUMALOG) 100 UNIT/ML KwikPen, Inject 8 Units into the skin 3 (three) times daily with meals.,  Disp: 15 mL, Rfl: 0 .  Insulin Pen Needle 32G X 4 MM MISC, USE AS DIRECTED WITH INSULIN PENS, Disp: 100 each, Rfl: 0 .  levothyroxine (SYNTHROID, LEVOTHROID) 50 MCG tablet, Take 1 tablet (50 mcg total) by mouth daily., Disp: 30 tablet, Rfl: 0 .  losartan (COZAAR) 100 MG tablet, Take 1 tablet (100 mg total) by mouth daily. (Patient taking differently: Take 100 mg by mouth at bedtime.), Disp: 90 tablet, Rfl: 3 .  Multiple Vitamins-Minerals (VITAMIN D3 COMPLETE PO), Take 1 capsule by mouth daily in the afternoon., Disp: , Rfl:  .  nortriptyline (PAMELOR) 50 MG capsule, Take 50 mg by mouth 2 (two) times daily., Disp: , Rfl:  .  omega-3 fish oil (MAXEPA) 1000 MG CAPS capsule, Take 1 capsule by mouth daily., Disp: , Rfl:  .  pantoprazole (PROTONIX) 40 MG tablet, Take 1 tablet (40 mg total) by mouth daily., Disp:  , Rfl:  .  torsemide (DEMADEX) 20 MG tablet, Take 4 tablets (80 mg total) by mouth every morning AND 3 tablets (60 mg total) every evening. (Patient taking differently: Take 4 tablets (80 mg total) by mouth every morning AND 4  tablets (80 mg total) every evening.), Disp: 210 tablet, Rfl: 11 .  cyclobenzaprine (FLEXERIL) 10 MG tablet, Take 1 tablet by mouth at bedtime. (Patient not taking: No sig reported), Disp: , Rfl:  .  methocarbamol (ROBAXIN) 500 MG tablet, Take 1 tablet (500 mg total) by mouth every 8 (eight) hours as needed for muscle spasms. (Patient not taking: Reported on 04/15/2021), Disp: 30 tablet, Rfl: 0 Allergies  Allergen Reactions  . Gabapentin Nausea And Vomiting and Other (See Comments)    POSSIBLE SHAKING? TAKING MED CURRENTLY  . Lyrica [Pregabalin] Other (See Comments)    Shaking Pt still taking lyrica--"probably will stop taking"       Social History   Socioeconomic History  . Marital status: Divorced    Spouse name: Not on file  . Number of children: 1  . Years of education: 92  . Highest education level: 12th grade  Occupational History  . Occupation: disabled   Tobacco Use  . Smoking status: Current Some Day Smoker    Packs/day: 1.00    Years: 44.00    Pack years: 44.00    Types: E-cigarettes, Cigarettes  . Smokeless tobacco: Former Systems developer    Types: Snuff  Vaping Use  . Vaping Use: Former  . Devices: 11/26/2018 "stopped months ago"  Substance and Sexual Activity  . Alcohol use: Yes    Comment: occ  . Drug use: Yes    Types: "Crack" cocaine, Marijuana, Oxycodone  . Sexual activity: Not Currently  Other Topics Concern  . Not on file  Social History Narrative   Lives in [place], in [place] with sister.  They are looking to move but don't have a place to go yet.     Social Determinants of Health   Financial Resource Strain: Low Risk   . Difficulty of Paying Living Expenses: Not very hard  Food Insecurity: Not on file  Transportation Needs: Not on file  Physical Activity: Not on file  Stress: Not on file  Social Connections: Not on file  Intimate Partner Violence: Not on file    Physical Exam      Future Appointments  Date Time Provider [place]  04/16/2021 10:30 AM Ricard Dillon, MD [facility]  04/20/2021  3:45 PM Gardiner Barefoot, DPM [facility]  04/27/2021  3:00 PM [facility] PA/NP [facility] None  05/19/2021  9:15 AM Kroeger, Lorelee Cover., PA-C [facility]    BP (!) 90/50   Pulse 62   Resp 18   Wt 219 lb (99.3 kg)   LMP 11/27/2012   BMI 37.59 kg/m   B/P standing-76/49 CBG PTA_259 Weight yesterday-219 Last visit weight-221  Pt reports she is doing ok, she missed her cardiomems readings for 2 days.  She did do it today.  Weight is down 2 lbs from last week.  Pt denies dizziness, she did report she has some acid reflux. She has heavy pain in her chest, but no sob. No sweating with it.  She reports feeling she needs to throw up but cant.  She does report eating pea salad and BBQ sandwich today and ate BBQ last night as well.  meds verified and pill box refilled.  Obtained EKG since she is c/o c/p like  she is.  She doesn't think she ate anything to increase her acid reflux.  But she has been eating a lot of high salt foods.  She is non-compliant with food intake so our goals with her is to get her meds straightened out and  move back to bubble packs and ensure that is done.  She did have a fall- no LOC, did not strike her head. She reports the fall was caused by a trip.  She is orthostatic.  EKG looks normal-she does have a lot of artifact due to her trembling.  Spoke to amy and b/c her b/p is so low then we are to hold her evening torsemide today. Resume normal dosing tomor.  Will f/u next week.   Marylouise Stacks, [place] [facility] Paramedic  04/15/21

## 2021-04-15 NOTE — Telephone Encounter (Signed)
Resume torsemide 80 mg twice a day 04/16/21/  Edrie Ehrich NP_C 3:09 PM

## 2021-04-16 ENCOUNTER — Encounter (HOSPITAL_BASED_OUTPATIENT_CLINIC_OR_DEPARTMENT_OTHER): Payer: Medicare Other | Admitting: Internal Medicine

## 2021-04-20 ENCOUNTER — Ambulatory Visit: Payer: Medicare Other | Admitting: Podiatry

## 2021-04-22 ENCOUNTER — Other Ambulatory Visit (HOSPITAL_COMMUNITY): Payer: Self-pay

## 2021-04-22 NOTE — Progress Notes (Signed)
Paramedicine Encounter    Patient ID: [name], female    DOB: 09-Oct-1961, 60 y.o.   MRN: [id number]   Patient Care Team: System, Provider Not In as PCP - General [name], MD as PCP - Cardiology (Cardiology) [name], MD as PCP - Electrophysiology (Cardiology) [name], MD as Consulting Physician (Cardiology) [name], MD as Consulting Physician (Pulmonary Disease)  Patient Active Problem List   Diagnosis Date Noted   Ulcer of foot, unspecified laterality, limited to breakdown of skin ([place]) 02/17/2021   Altered mental status 01/02/2021   AF (paroxysmal atrial fibrillation) ([place]) 01/02/2021   CAD (coronary artery disease) 01/02/2021   PVD (peripheral vascular disease) ([place]) 01/02/2021   Acute renal failure superimposed on stage 4 chronic kidney disease ([place]) 01/02/2021   Diabetes mellitus type 2, uncontrolled, with complications ([place]) [id number]   Diabetic nephropathy ([place]) 01/02/2021   Low back pain 06/01/2020   Hyperlipidemia 04/03/2020   Muscle weakness 03/20/2020   Closed fracture of neck of right radius 03/13/2020   Pain in elbow 03/12/2020   PAD (peripheral artery disease) ([place]) 12/26/2019   Acute renal failure ([place])    Acute respiratory failure with hypoxia ([place]) 12/02/2019   Acute exacerbation of CHF (congestive heart failure) ([place]) 12/01/2019   Cellulitis in diabetic foot ([place]) 07/08/2019   Osteomyelitis ([place]) 06/21/2019   NICM (nonischemic cardiomyopathy) ([place]) 06/20/2019   Non-healing ulcer ([place]) 06/20/2019   Acute CHF (congestive heart failure) ([place]) 11/26/2018   CHF (congestive heart failure) ([place]) 11/26/2018   Acute respiratory failure ([place]) 10/21/2018   AKI (acute kidney injury) ([place]) 08/18/2018   Coagulation disorder ([place]) 08/09/2017   Depression 07/21/2017   At risk for adverse drug reaction 06/20/2017   Peripheral neuropathy 06/20/2017   Acute osteomyelitis of right foot ([place]) 06/13/2017   S/P transmetatarsal  amputation of foot, right ([place]) 06/05/2017   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 ([place]) 05/19/2017   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[place])    SIRS (systemic inflammatory response syndrome) ([place]) 04/06/2017   CKD (chronic kidney disease), stage III ([place]) 11/24/2016   Suspected sleep apnea 11/24/2016   Ulcer of toe of right foot, with necrosis of bone ([place]) 10/27/2016   Ulcer of left lower leg ([place]) 05/19/2016   Severe obesity (BMI >= 40) ([place]) 02/24/2016   COPD GOLD 0  02/24/2016   Morbid obesity ([place]) 02/24/2016   Encounter for therapeutic drug monitoring 02/10/2016   Symptomatic bradycardia 01/12/2016   Essential hypertension 12/22/2015   Chronic combined systolic and diastolic CHF (congestive heart failure) ([place])    [place]    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 11/08/2015   Tobacco abuse 10/23/2015   Coronary artery disease    DOE (dyspnea on exertion) 04/29/2015   Diabetes mellitus type 2, uncontrolled ([place]) 02/08/2015   Influenza A 02/07/2015   Wrist fracture, left, with routine healing, subsequent encounter 02/05/2015   Wrist fracture, left, closed, initial encounter 01/29/2015   PAF (paroxysmal atrial fibrillation) ([place]) 01/16/2015   Carotid arterial disease ([place]) 01/16/2015   Claudication ([place]) 01/15/2015   Demand ischemia ([place]) 10/29/2014   Insomnia 02/03/2014   COPD with acute exacerbation ([place]) 02/01/2014   S/P peripheral artery angioplasty - [place]bo[place] atherectomy; R SFA 09/11/2013    Class: Acute   Leg pain, bilateral 08/19/2013   Hypothyroidism 07/31/2013   Cellulitis 06/13/2013   History of cocaine abuse ([place]) 06/13/2013   Long term current use of anticoagulant therapy 05/20/2013   Alcohol abuse  Narcotic abuse ([place])    Marijuana abuse    Alcoholic cirrhosis ([place])    DM (diabetes mellitus), type 2 with peripheral vascular complications ([name])     Current Outpatient Medications:    ACCU-CHEK GUIDE test strip, USE TO  CHECK BLOOD SUGAR FOUR TIMES DAILY, Disp: , Rfl:    albuterol (VENTOLIN HFA) 108 (90 Base) MCG/ACT inhaler, Inhale 2 puffs into the lungs every 6 (six) hours as needed for wheezing or shortness of breath., Disp: 18 g, Rfl: 0   amiodarone (PACERONE) 200 MG tablet, Take 0.5 tablets (100 mg total) by mouth daily., Disp: 45 tablet, Rfl: 3   amLODipine (NORVASC) 10 MG tablet, Take 1 tablet (10 mg total) by mouth daily., Disp: 90 tablet, Rfl: 3   apixaban (ELIQUIS) 5 MG TABS tablet, TAKE 1 TABLET(5 MG) BY MOUTH TWICE DAILY, Disp: 180 tablet, Rfl: 3   atorvastatin (LIPITOR) 40 MG tablet, Take 40 mg by mouth every evening., Disp: , Rfl:    bisoprolol (ZEBETA) 5 MG tablet, Take 1 tablet (5 mg total) by mouth daily., Disp: 90 tablet, Rfl: 3   budesonide-formoterol (SYMBICORT) 160-4.5 MCG/ACT inhaler, Inhale 2 puffs into the lungs 2 (two) times daily., Disp: 1 Inhaler, Rfl: 2   colchicine 0.6 MG tablet, Take 0.6 mg by mouth daily., Disp: , Rfl:    Dulaglutide (TRULICITY [place]), Inject A999333 mg into the skin once a week., Disp: , Rfl:    empagliflozin (JARDIANCE) 10 MG TABS tablet, Take 1 tablet (10 mg total) by mouth daily before breakfast., Disp: 90 tablet, Rfl: 3   FLUoxetine (PROZAC) 10 MG tablet, Take 10 mg by mouth at bedtime., Disp: , Rfl:    folic acid (FOLVITE) 1 MG tablet, Take 1 tablet (1 mg total) by mouth daily., Disp: 30 tablet, Rfl: 0   hydrALAZINE (APRESOLINE) 25 MG tablet, Take 1 tablet (25 mg total) by mouth 3 (three) times daily., Disp: 90 tablet, Rfl: 11   insulin aspart (NOVOLOG) 100 UNIT/ML FlexPen, INJECT 8 UNITS INTO THE SKIN THREE TIMES DAILY WITH MEALS., Disp: 15 mL, Rfl: 0   insulin glargine (LANTUS) 100 UNIT/ML Solostar Pen, INJECT 20 UNITS INTO THE SKIN DAILY., Disp: 15 mL, Rfl: 0   insulin lispro (HUMALOG) 100 UNIT/ML KwikPen, Inject 8 Units into the skin 3 (three) times daily with meals., Disp: 15 mL, Rfl: 0   Insulin Pen Needle 32G X 4 MM MISC, USE AS DIRECTED WITH INSULIN PENS,  Disp: 100 each, Rfl: 0   levothyroxine (SYNTHROID, LEVOTHROID) 50 MCG tablet, Take 1 tablet (50 mcg total) by mouth daily., Disp: 30 tablet, Rfl: 0   losartan (COZAAR) 100 MG tablet, Take 1 tablet (100 mg total) by mouth daily. (Patient taking differently: Take 100 mg by mouth at bedtime.), Disp: 90 tablet, Rfl: 3   Multiple Vitamins-Minerals (VITAMIN D3 COMPLETE PO), Take 1 capsule by mouth daily in the afternoon., Disp: , Rfl:    nortriptyline (PAMELOR) 50 MG capsule, Take 50 mg by mouth 2 (two) times daily., Disp: , Rfl:    omega-3 fish oil (MAXEPA) 1000 MG CAPS capsule, Take 1 capsule by mouth daily., Disp: , Rfl:    pantoprazole (PROTONIX) 40 MG tablet, Take 1 tablet (40 mg total) by mouth daily., Disp:  , Rfl:    torsemide (DEMADEX) 20 MG tablet, Take 4 tablets (80 mg total) by mouth every morning AND 3 tablets (60 mg total) every evening. (Patient taking differently: Take 4 tablets (80 mg total) by mouth every morning AND 4  tablets (80 mg total) every evening.), Disp: 210 tablet, Rfl: 11   cyclobenzaprine (FLEXERIL) 10 MG tablet, Take 1 tablet by mouth at bedtime. (Patient not taking: No sig reported), Disp: , Rfl:    methocarbamol (ROBAXIN) 500 MG tablet, Take 1 tablet (500 mg total) by mouth every 8 (eight) hours as needed for muscle spasms. (Patient not taking: No sig reported), Disp: 30 tablet, Rfl: 0 Allergies  Allergen Reactions   Gabapentin Nausea And Vomiting and Other (See Comments)    POSSIBLE SHAKING? TAKING MED CURRENTLY   Lyrica [Pregabalin] Other (See Comments)    Shaking Pt still taking lyrica--"probably will stop taking"       Social History   Socioeconomic History   Marital status: Divorced    Spouse name: Not on file   Number of children: 1   Years of education: 12   Highest education level: 12th grade  Occupational History   Occupation: disabled  Tobacco Use   Smoking status: Some Days    Packs/day: 1.00    Years: 44.00    Pack years: 44.00    Types:  E-cigarettes, Cigarettes   Smokeless tobacco: Former    Types: Snuff  Vaping Use   Vaping Use: Former   Devices: 11/26/2018 "stopped months ago"  Substance and Sexual Activity   Alcohol use: Yes    Comment: occ   Drug use: Yes    Types: "Crack" cocaine, Marijuana, Oxycodone   Sexual activity: Not Currently  Other Topics Concern   Not on file  Social History Narrative   Lives in [place], in [place] with sister.  They are looking to move but don't have a place to go yet.     Social Determinants of Health   Financial Resource Strain: Low Risk    Difficulty of Paying Living Expenses: Not very hard  Food Insecurity: Not on file  Transportation Needs: Not on file  Physical Activity: Not on file  Stress: Not on file  Social Connections: Not on file  Intimate Partner Violence: Not on file    Physical Exam      Future Appointments  Date Time Provider [place]  04/27/2021  3:00 PM [facility] PA/NP [facility] None  05/19/2021  9:15 AM Kroeger, Lorelee Cover., PA-C [facility]    BP 124/72   Pulse 72   Resp 18   Wt 217 lb (98.4 kg)   LMP 11/27/2012   SpO2 93%   BMI 37.25 kg/m  CBG PTA-252 Weight yesterday-218 Last visit weight-219  Pt reports doing ok, the other day she was having diarrhea and didn't take her torsemide in the afternoon that day. She has been taking a weight loss supplement called keto gelatin capsules-I advised her to talk with doctor before using those-that may have been why she was having diarrhea if it is one of those that causes a cleansing effect from it. Not sure, not familiar with it.  Pt does get sob when shes in a rush, sleeping ok, pt denies dizziness, no c/p. No edema noted. Lungs sound awful-wheezing-she has not used her inhaler yet this morning. She still smoking.  She is c/o severe foot pain-she did not get to the wound center last week-she is going to call them to resch. She reports it has been bleeding some.  Her b/p is much better this week.   Meds verified and pill box refilled. Will f/u next week.   Marylouise Stacks, [facility] Paramedic  04/22/21

## 2021-04-25 NOTE — Progress Notes (Signed)
Patient ID: [name], female   DOB: [birth year], 60 y.o.   MRN: [id number]    Advanced Heart Failure Clinic Note   PCP: Dr. Harless Nakayama HF Cardiology: Dr. Aundra Dubin PV: Dr. Gwenlyn Found  Ms Kessinger is a 60 y.o. with history of PAD, carotid stenosis s/p left CEA, relatively mild CAD, chronic systolic CHF, paroxysmal atrial fibrillation and prior substance abuse presents for CHF clinic followup.  She was admitted in 1/17 with acute hypoxemic respiratory failure in the setting of atrial fibrillation/RVR and volume overload.  She was initially intubated.  She converted back to NSR with amiodarone gtt.  She was treated with IV Lasix, steroids, bronchodilators.  She developed AKI and losartan was stopped.    She was admitted in 3/17 with symptomatic bradycardia.  She was supposed to stop diltiazem after this admission but continued to take it.  She presented later in 3/17, again with symptomatic bradycardia (junctional bradycardia), hypotension, and AKI.  Diltiazem and bisoprolol were stopped.  HR recovered and she has had no problems since.  ACEI was stopped with AKI.   In 5/18, she had peripheral angiography with atherectomy of right SFA.  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 7/18 ABIs showed 0.65 on right, 0.31 on left.  In 8/20, she had peripheral angiography showing totally occluded left SFA.  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 Donnetta Hutching.  ABIs in 2/21 were 0.44 on right, 0.99 on left.   Echo in 1/21 showed EF 50%, mild LVH, normal RV, PASP 60 mmHg.   Echo 03/08/21 EF 55-60% with basal to mid inferolateral hypokinesis.   Seen by Dr. Aundra Dubin last month for f/u and was fluid overloaded. Instructed to increase torsemide to 80 qam/60 qpm.  She remained fluid overloaded at last clinic visit (5/22) in setting of dietary and salt indescretion. Her diuretics were further increased.    Today she returns for HF follow up. Overall  feeling fine. Denies increasing SOB, CP, dizziness, edema, or PND/Orthopnea. Uses walker, cane and electric wheelchair at home. Appetite ok. No fever or chills. Weight at home 220 pounds. Taking all medications. No ETOH in 3 weeks. No cocaine use. Smokes 1 ppd.  Cardiomems: Last reading from 6/11 showed PADP 17 (goal 21).  Labs (1/17): K 5.2, creatinine 1.4, AST 43, ALT normal Labs (2/17): K 4.2, creatinine 1.3, BNP 607, TSH normal Labs (3/17): K 4.2, creatinine 1.45, AST/ALT normal, HCT 28.7 Labs (4/17): K 4.4, creatinine 1.61 Labs (08/31/2016): K 4.5 Creatinine 1.43  Labs (11/17): K 4.2, creatinine 1.4 Labs (9/18): K 4.1, creatinine 1.1, TSH normal Labs 04/13/2018: K 5 Creatinine 1.75 Labs (2/21): K 4.1, creatinine 1.58 Labs (4/22): K 3.7, creatinine 1.55 Labs (5/22): K 3.8, creatinine 1.54  PMH: 1. Carotid stenosis: Known occluded right carotid.  Left CEA in 4/16.  - Carotid dopplers (7/21): CTO RICA, mild disease LICA.  2. CAD: LHC in 12/15 with 80% stenosis in small OM1, nonobstructive disease in other territories.  3. Chronic systolic CHF: Nonischemic cardiomyopathy (?due to ETOH or prior drug abuse).  She has a Cardiomems.  - Echo (1/17) with EF 45%, mild LV hypertrophy, moderate diastolic dysfunction, inferolateral severe hypokinesis, mildly decreased RV systolic function.  - Echo (1/18): EF 40-45%, mild LVH, normal RV size and systolic function.  - Echo (1/21): EF 50%, mild LVH, normal RV, PASP 60 mmHg.  - Echo (4/22): EF 55-60%, basal-mid inferolateral hypokinesis with grade II  diastolic dysfunction, RV normal, PASP 42 4. Atrial fibrillation: Paroxysmal.   5. Type II diabetes 6. CKD: Stage III. 7. COPD: Smokes 1 ppd.  8. 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  No longer drinks.  9. Hypothyroidism 10. PAD: Atherectomy SFA in 2014 (Dr Gwenlyn Found).  Peripheral arterial dopplers (2/17) with focal 75-99% proximal right SFA stenosis, occluded mid-distal right SFA, chronic occlusion of all  runoff arteries on right. - In 5/18, she had peripheral angiography with atherectomy of right SFA.   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  - 7/18 ABIs showed 0.65 on right, 0.31 on left.   - Peripheral angiography 8/20: Totally occluded left SFA.  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 Donnetta Hutching.   - ABIs (2/21): 0.44 R, 0.99 L.  11. Anemia 12. Prior cocaine abuse.  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  14. OSA: Has not been able to tolerate CPAP.   SH: Lives with sister.  Prior cocaine abuse.  Prior ETOH abuse.  Quit smoking in 1/17, restarted, and quit again in 4/19, restarted again.  FH: CAD  Review of systems complete and found to be negative unless listed in HPI.    Current Outpatient Medications  Medication Sig Dispense Refill   ACCU-CHEK GUIDE test strip USE TO CHECK BLOOD SUGAR FOUR TIMES DAILY     albuterol (VENTOLIN HFA) 108 (90 Base) MCG/ACT inhaler Inhale 2 puffs into the lungs every 6 (six) hours as needed for wheezing or shortness of breath. 18 g 0   amiodarone (PACERONE) 200 MG tablet Take 0.5 tablets (100 mg total) by mouth daily. 45 tablet 3   amLODipine (NORVASC) 10 MG tablet Take 1 tablet (10 mg total) by mouth daily. 90 tablet 3   apixaban (ELIQUIS) 5 MG TABS tablet TAKE 1 TABLET(5 MG) BY MOUTH TWICE DAILY 180 tablet 3   atorvastatin (LIPITOR) 40 MG tablet Take 40 mg by mouth every evening.     bisoprolol (ZEBETA) 5 MG tablet Take 1 tablet (5 mg total) by mouth daily. 90 tablet 3   budesonide-formoterol (SYMBICORT) 160-4.5 MCG/ACT inhaler Inhale 2 puffs into the lungs 2 (two) times daily. 1 Inhaler 2   colchicine 0.6 MG tablet Take 0.6 mg by mouth daily.     cyclobenzaprine (FLEXERIL) 10 MG tablet Take 1 tablet by mouth at bedtime.     Dulaglutide (TRULICITY [name]) Inject A999333 mg into the skin once a week.     empagliflozin (JARDIANCE) 10 MG TABS tablet  Take 1 tablet (10 mg total) by mouth daily before breakfast. 90 tablet 3   FLUoxetine (PROZAC) 10 MG tablet Take 10 mg by mouth at bedtime.     folic acid (FOLVITE) 1 MG tablet Take 1 tablet (1 mg total) by mouth daily. 30 tablet 0   hydrALAZINE (APRESOLINE) 25 MG tablet Take 1 tablet (25 mg total) by mouth 3 (three) times daily. 90 tablet 11   insulin aspart (NOVOLOG) 100 UNIT/ML FlexPen INJECT 8 UNITS INTO THE SKIN THREE TIMES DAILY WITH MEALS. 15 mL 0   insulin glargine (LANTUS) 100 UNIT/ML Solostar Pen INJECT 20 UNITS INTO THE SKIN DAILY. 15 mL 0   insulin lispro (HUMALOG) 100 UNIT/ML KwikPen Inject 8 Units into the skin 3 (three) times daily with meals. 15 mL 0   Insulin Pen Needle 32G X 4 MM MISC USE AS DIRECTED WITH INSULIN PENS 100 each 0  levothyroxine (SYNTHROID, LEVOTHROID) 50 MCG tablet Take 1 tablet (50 mcg total) by mouth daily. 30 tablet 0   losartan (COZAAR) 100 MG tablet Take 1 tablet (100 mg total) by mouth daily. (Patient taking differently: Take 100 mg by mouth at bedtime.) 90 tablet 3   methocarbamol (ROBAXIN) 500 MG tablet Take 1 tablet (500 mg total) by mouth every 8 (eight) hours as needed for muscle spasms. 30 tablet 0   Multiple Vitamins-Minerals (VITAMIN D3 COMPLETE PO) Take 1 capsule by mouth daily in the afternoon.     nortriptyline (PAMELOR) 50 MG capsule Take 50 mg by mouth 2 (two) times daily.     omega-3 fish oil (MAXEPA) 1000 MG CAPS capsule Take 1 capsule by mouth daily.     pantoprazole (PROTONIX) 40 MG tablet Take 1 tablet (40 mg total) by mouth daily.     torsemide (DEMADEX) 20 MG tablet Take 4 tablets (80 mg total) by mouth every morning AND 3 tablets (60 mg total) every evening. (Patient taking differently: Take 4 tablets (80 mg total) by mouth every morning AND 4 tablets (80 mg total) every evening.) 210 tablet 11   No current facility-administered medications for this encounter.   Vitals:   04/27/21 1525  BP: 128/68  Pulse: 70  SpO2: 100%  Weight:  100.9 kg (222 lb 6.4 oz)   Wt Readings from Last 3 Encounters:  04/27/21 100.9 kg (222 lb 6.4 oz)  04/22/21 98.4 kg (217 lb)  04/15/21 99.3 kg (219 lb)    PHYSICAL EXAM: General:  NAD. No resp difficulty. Arrived in wheelchair. HEENT: Normal Neck: Supple. No JVD. Carotids 2+ bilat; no bruits. No lymphadenopathy or thryomegaly appreciated. Cor: PMI nondisplaced. Regular rate & rhythm. No rubs, gallops or murmurs. Lungs: Bilateral wheezes Abdomen: Obese, soft, nontender, nondistended. No hepatosplenomegaly. No bruits or masses. Good bowel sounds. Extremities: No cyanosis, clubbing, rash, edema, s/p right TMA. Neuro: alert & oriented x 3, cranial nerves grossly intact. Moves all 4 extremities w/o difficulty. Affect pleasant.  Assessment/Plan: 1. Chronic diastolic CHF: LHC in [id number] showing only 80% stenosis in small OM1. EF as low as 40-45% in the past.Recent echo 4/22 showed EF 55-60% with grade 2 diastolic dysfunction.   She is euvolemic on exam and by Reds Clip.  Not very active, NYHA class II-III symptoms.  - Continue torsemide 80 mg bid. - Advised to increase compliance rate w/ CardioMEMs to help closely monitor volume status - Advised low sodium diet + fluid restriction.  - Continue bisoprolol 5 mg daily.  - Continue losartan 100 mg daily.  - Continue Jardiance 10 mg daily. No GU symptoms. - Appreciate paramedicine assistance. 2. Atrial fibrillation: Paroxysmal.  - Continue amiodarone 100 mg daily. Recent HFTs/ TFTs 4/22 ok.  She is on levothyroxine, followed by PCP.  Needs regular eye exams. - Continue Eliquis 5 mg bid.  No bleeding. 3. PAD: Claudication bilaterally.  Not candidate for cilostazol with CHF.  She has had a left fem-pop bypass in 8/20.  ABIs in 2/21 were very abnormal on right.  She also has an ulceration on her right foot.  - At this point, she can stop Plavix as she is on Eliquis.   - Followup with Drs Gwenlyn Found and Early for PAD.  - Continue atorvastatin. LDL at goal  on Lipid panel 4/22 at 64 mg/dL  - Continue with wound clinic.  4. COPD:  Continues to smoke, encouraged to quit.   5. H/o cirrhosis: Likely from ETOH, advised to quit. 6.  OSA: Not able to tolerate CPAP.   7. CKD stage III:  Check BMET.  8. HTN: controlled on current regimen.  9.Type 2 diabetes: Per PCP.   10. CAD: LHC in 12/15 with 80% stenosis in small OM1, nonobstructive disease in other territories. - Continue statin.  - Given Eliquis use, Plavix stopped.   F/u w/ APP in 1-2 months. Stressed importance of compliance with Cardiomems.  [place], FNP-BC 04/27/2021

## 2021-04-27 ENCOUNTER — Other Ambulatory Visit: Payer: Self-pay

## 2021-04-27 ENCOUNTER — Other Ambulatory Visit (HOSPITAL_COMMUNITY): Payer: Self-pay

## 2021-04-27 ENCOUNTER — Ambulatory Visit (HOSPITAL_COMMUNITY)
Admission: RE | Admit: 2021-04-27 | Discharge: 2021-04-27 | Disposition: A | Discharge status: Home / Self Care | Payer: Medicare Other | Source: Ambulatory Visit | Attending: Family Medicine | Admitting: Family Medicine

## 2021-04-27 ENCOUNTER — Encounter (HOSPITAL_COMMUNITY): Payer: Self-pay

## 2021-04-27 VITALS — BP 128/68 | HR 70 | Wt 222.4 lb

## 2021-04-27 DIAGNOSIS — J449 Chronic obstructive pulmonary disease, unspecified: Secondary | ICD-10-CM | POA: Diagnosis not present

## 2021-04-27 DIAGNOSIS — E1151 Type 2 diabetes mellitus with diabetic peripheral angiopathy without gangrene: Secondary | ICD-10-CM | POA: Insufficient documentation

## 2021-04-27 DIAGNOSIS — E1122 Type 2 diabetes mellitus with diabetic chronic kidney disease: Secondary | ICD-10-CM | POA: Insufficient documentation

## 2021-04-27 DIAGNOSIS — Z79890 Hormone replacement therapy (postmenopausal): Secondary | ICD-10-CM | POA: Insufficient documentation

## 2021-04-27 DIAGNOSIS — I480 Paroxysmal atrial fibrillation: Secondary | ICD-10-CM | POA: Insufficient documentation

## 2021-04-27 DIAGNOSIS — Z8719 Personal history of other diseases of the digestive system: Secondary | ICD-10-CM

## 2021-04-27 DIAGNOSIS — N1831 Chronic kidney disease, stage 3a: Secondary | ICD-10-CM

## 2021-04-27 DIAGNOSIS — Z7984 Long term (current) use of oral hypoglycemic drugs: Secondary | ICD-10-CM | POA: Insufficient documentation

## 2021-04-27 DIAGNOSIS — I739 Peripheral vascular disease, unspecified: Secondary | ICD-10-CM

## 2021-04-27 DIAGNOSIS — G4733 Obstructive sleep apnea (adult) (pediatric): Secondary | ICD-10-CM | POA: Diagnosis not present

## 2021-04-27 DIAGNOSIS — Z794 Long term (current) use of insulin: Secondary | ICD-10-CM | POA: Insufficient documentation

## 2021-04-27 DIAGNOSIS — Z79899 Other long term (current) drug therapy: Secondary | ICD-10-CM | POA: Insufficient documentation

## 2021-04-27 DIAGNOSIS — L97519 Non-pressure chronic ulcer of other part of right foot with unspecified severity: Secondary | ICD-10-CM | POA: Diagnosis not present

## 2021-04-27 DIAGNOSIS — E11621 Type 2 diabetes mellitus with foot ulcer: Secondary | ICD-10-CM | POA: Insufficient documentation

## 2021-04-27 DIAGNOSIS — K7460 Unspecified cirrhosis of liver: Secondary | ICD-10-CM | POA: Diagnosis not present

## 2021-04-27 DIAGNOSIS — I6529 Occlusion and stenosis of unspecified carotid artery: Secondary | ICD-10-CM | POA: Insufficient documentation

## 2021-04-27 DIAGNOSIS — N1832 Chronic kidney disease, stage 3b: Secondary | ICD-10-CM | POA: Diagnosis not present

## 2021-04-27 DIAGNOSIS — Z7951 Long term (current) use of inhaled steroids: Secondary | ICD-10-CM | POA: Insufficient documentation

## 2021-04-27 DIAGNOSIS — I5042 Chronic combined systolic (congestive) and diastolic (congestive) heart failure: Secondary | ICD-10-CM | POA: Insufficient documentation

## 2021-04-27 DIAGNOSIS — Z7289 Other problems related to lifestyle: Secondary | ICD-10-CM | POA: Diagnosis not present

## 2021-04-27 DIAGNOSIS — Z7901 Long term (current) use of anticoagulants: Secondary | ICD-10-CM | POA: Diagnosis not present

## 2021-04-27 DIAGNOSIS — F17210 Nicotine dependence, cigarettes, uncomplicated: Secondary | ICD-10-CM | POA: Diagnosis not present

## 2021-04-27 DIAGNOSIS — I10 Essential (primary) hypertension: Secondary | ICD-10-CM

## 2021-04-27 DIAGNOSIS — I130 Hypertensive heart and chronic kidney disease with heart failure and stage 1 through stage 4 chronic kidney disease, or unspecified chronic kidney disease: Secondary | ICD-10-CM | POA: Diagnosis not present

## 2021-04-27 DIAGNOSIS — I2510 Atherosclerotic heart disease of native coronary artery without angina pectoris: Secondary | ICD-10-CM | POA: Diagnosis not present

## 2021-04-27 LAB — BASIC METABOLIC PANEL
Anion gap: 11 (ref 5–15)
BUN: 40 mg/dL — ABNORMAL HIGH (ref 6–20)
CO2: 27 mmol/L (ref 22–32)
Calcium: 8.7 mg/dL — ABNORMAL LOW (ref 8.9–10.3)
Chloride: 98 mmol/L (ref 98–111)
Creatinine, Ser: 1.85 mg/dL — ABNORMAL HIGH (ref 0.44–1.00)
GFR, Estimated: 31 mL/min — ABNORMAL LOW (ref >60)
Glucose, Bld: 226 mg/dL — ABNORMAL HIGH (ref 70–99)
Potassium: 4.1 mmol/L (ref 3.5–5.1)
Sodium: 136 mmol/L (ref 135–145)

## 2021-04-27 NOTE — Progress Notes (Signed)
ReDS Vest / Clip - 04/27/21 1500       ReDS Vest / Clip   Station Marker B    Ruler Value 34.5    ReDS Value Range Low volume    ReDS Actual Value 30    Anatomical Comments sitting

## 2021-04-27 NOTE — Patient Instructions (Signed)
Great to see you today! Lab work today. We will call with any abnormal values. See follow-up appt.
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
## 2021-04-29 ENCOUNTER — Other Ambulatory Visit (HOSPITAL_COMMUNITY): Payer: Self-pay

## 2021-04-29 NOTE — Progress Notes (Signed)
Came out today for med rec. She was seen in clinic this week.  No med changes from that.   Weight today-222 She has been using cardiomems more.  Meds verified and pill box refilled.   Marylouise Stacks, EMT-Paramedic 04/29/21

## 2021-05-03 ENCOUNTER — Emergency Department (HOSPITAL_COMMUNITY): Payer: Medicare Other

## 2021-05-03 ENCOUNTER — Encounter (HOSPITAL_COMMUNITY): Payer: Self-pay | Admitting: Emergency Medicine

## 2021-05-03 ENCOUNTER — Observation Stay (HOSPITAL_COMMUNITY)
Admission: EM | Admit: 2021-05-03 | Discharge: 2021-05-05 | Disposition: A | Discharge status: Home / Self Care | Payer: Medicare Other | Attending: Internal Medicine | Admitting: Internal Medicine

## 2021-05-03 ENCOUNTER — Other Ambulatory Visit: Payer: Self-pay

## 2021-05-03 DIAGNOSIS — N179 Acute kidney failure, unspecified: Secondary | ICD-10-CM | POA: Diagnosis present

## 2021-05-03 DIAGNOSIS — S0083XA Contusion of other part of head, initial encounter: Secondary | ICD-10-CM

## 2021-05-03 DIAGNOSIS — W19XXXA Unspecified fall, initial encounter: Secondary | ICD-10-CM

## 2021-05-03 DIAGNOSIS — L8990 Pressure ulcer of unspecified site, unspecified stage: Secondary | ICD-10-CM | POA: Insufficient documentation

## 2021-05-03 DIAGNOSIS — E8779 Other fluid overload: Secondary | ICD-10-CM

## 2021-05-03 DIAGNOSIS — S0031XA Abrasion of nose, initial encounter: Secondary | ICD-10-CM | POA: Diagnosis not present

## 2021-05-03 DIAGNOSIS — J449 Chronic obstructive pulmonary disease, unspecified: Secondary | ICD-10-CM | POA: Insufficient documentation

## 2021-05-03 DIAGNOSIS — I5043 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) and diastolic (congestive) heart failure: Secondary | ICD-10-CM | POA: Diagnosis not present

## 2021-05-03 DIAGNOSIS — W01198A Fall on same level from slipping, tripping and stumbling with subsequent striking against other object, initial encounter: Secondary | ICD-10-CM | POA: Diagnosis not present

## 2021-05-03 DIAGNOSIS — E118 Type 2 diabetes mellitus with unspecified complications: Secondary | ICD-10-CM | POA: Diagnosis present

## 2021-05-03 DIAGNOSIS — S0081XA Abrasion of other part of head, initial encounter: Secondary | ICD-10-CM | POA: Diagnosis not present

## 2021-05-03 DIAGNOSIS — I739 Peripheral vascular disease, unspecified: Secondary | ICD-10-CM | POA: Diagnosis present

## 2021-05-03 DIAGNOSIS — S0990XA Unspecified injury of head, initial encounter: Secondary | ICD-10-CM | POA: Diagnosis present

## 2021-05-03 DIAGNOSIS — E1122 Type 2 diabetes mellitus with diabetic chronic kidney disease: Secondary | ICD-10-CM | POA: Insufficient documentation

## 2021-05-03 DIAGNOSIS — L0390 Cellulitis, unspecified: Secondary | ICD-10-CM | POA: Diagnosis present

## 2021-05-03 DIAGNOSIS — N183 Chronic kidney disease, stage 3 unspecified: Secondary | ICD-10-CM | POA: Diagnosis present

## 2021-05-03 DIAGNOSIS — Z79899 Other long term (current) drug therapy: Secondary | ICD-10-CM | POA: Diagnosis not present

## 2021-05-03 DIAGNOSIS — Z20822 Contact with and (suspected) exposure to covid-19: Secondary | ICD-10-CM | POA: Insufficient documentation

## 2021-05-03 DIAGNOSIS — Y900 Blood alcohol level of less than 20 mg/100 ml: Secondary | ICD-10-CM | POA: Diagnosis not present

## 2021-05-03 DIAGNOSIS — I480 Paroxysmal atrial fibrillation: Secondary | ICD-10-CM | POA: Diagnosis not present

## 2021-05-03 DIAGNOSIS — N1832 Chronic kidney disease, stage 3b: Secondary | ICD-10-CM | POA: Diagnosis not present

## 2021-05-03 DIAGNOSIS — S50311A Abrasion of right elbow, initial encounter: Secondary | ICD-10-CM | POA: Diagnosis not present

## 2021-05-03 DIAGNOSIS — Z7901 Long term (current) use of anticoagulants: Secondary | ICD-10-CM | POA: Insufficient documentation

## 2021-05-03 DIAGNOSIS — G4733 Obstructive sleep apnea (adult) (pediatric): Secondary | ICD-10-CM | POA: Diagnosis present

## 2021-05-03 DIAGNOSIS — S80211A Abrasion, right knee, initial encounter: Secondary | ICD-10-CM | POA: Diagnosis not present

## 2021-05-03 HISTORY — DX: Acute embolism and thrombosis of unspecified deep veins of unspecified lower extremity: I82.409

## 2021-05-03 HISTORY — DX: Chronic obstructive pulmonary disease, unspecified: J44.9

## 2021-05-03 HISTORY — DX: Disorder of kidney and ureter, unspecified: N28.9

## 2021-05-03 HISTORY — DX: Essential (primary) hypertension: I10

## 2021-05-03 HISTORY — DX: Type 2 diabetes mellitus without complications: E11.9

## 2021-05-03 HISTORY — DX: Heart failure, unspecified: I50.9

## 2021-05-03 LAB — COMPREHENSIVE METABOLIC PANEL
ALT: 28 U/L (ref 0–44)
AST: 25 U/L (ref 15–41)
Albumin: 2.7 g/dL — ABNORMAL LOW (ref 3.5–5.0)
Alkaline Phosphatase: 176 U/L — ABNORMAL HIGH (ref 38–126)
Anion gap: 13 (ref 5–15)
BUN: 50 mg/dL — ABNORMAL HIGH (ref 6–20)
CO2: 21 mmol/L — ABNORMAL LOW (ref 22–32)
Calcium: 8.2 mg/dL — ABNORMAL LOW (ref 8.9–10.3)
Chloride: 98 mmol/L (ref 98–111)
Creatinine, Ser: 2.66 mg/dL — ABNORMAL HIGH (ref 0.44–1.00)
GFR, Estimated: 20 mL/min — ABNORMAL LOW (ref >60)
Glucose, Bld: 213 mg/dL — ABNORMAL HIGH (ref 70–99)
Potassium: 4.3 mmol/L (ref 3.5–5.1)
Sodium: 132 mmol/L — ABNORMAL LOW (ref 135–145)
Total Bilirubin: 0.4 mg/dL (ref 0.3–1.2)
Total Protein: 6.7 g/dL (ref 6.5–8.1)

## 2021-05-03 LAB — CBC
HCT: 33.1 % — ABNORMAL LOW (ref 36.0–46.0)
Hemoglobin: 10.1 g/dL — ABNORMAL LOW (ref 12.0–15.0)
MCH: 28.5 pg (ref 26.0–34.0)
MCHC: 30.5 g/dL (ref 30.0–36.0)
MCV: 93.5 fL (ref 80.0–100.0)
Platelets: 330 10*3/uL (ref 150–400)
RBC: 3.54 MIL/uL — ABNORMAL LOW (ref 3.87–5.11)
RDW: 15.1 % (ref 11.5–15.5)
WBC: 15.8 10*3/uL — ABNORMAL HIGH (ref 4.0–10.5)
nRBC: 0 % (ref 0.0–0.2)

## 2021-05-03 LAB — APTT: aPTT: 35 seconds (ref 24–36)

## 2021-05-03 LAB — I-STAT CHEM 8, ED
BUN: 45 mg/dL — ABNORMAL HIGH (ref 6–20)
Calcium, Ion: 0.97 mmol/L — ABNORMAL LOW (ref 1.15–1.40)
Chloride: 102 mmol/L (ref 98–111)
Creatinine, Ser: 2.8 mg/dL — ABNORMAL HIGH (ref 0.44–1.00)
Glucose, Bld: 208 mg/dL — ABNORMAL HIGH (ref 70–99)
HCT: 30 % — ABNORMAL LOW (ref 36.0–46.0)
Hemoglobin: 10.2 g/dL — ABNORMAL LOW (ref 12.0–15.0)
Potassium: 4.4 mmol/L (ref 3.5–5.1)
Sodium: 133 mmol/L — ABNORMAL LOW (ref 135–145)
TCO2: 22 mmol/L (ref 22–32)

## 2021-05-03 LAB — SAMPLE TO BLOOD BANK

## 2021-05-03 LAB — PROTIME-INR
INR: 1.3 — ABNORMAL HIGH (ref 0.8–1.2)
Prothrombin Time: 16.2 seconds — ABNORMAL HIGH (ref 11.4–15.2)

## 2021-05-03 LAB — LACTIC ACID, PLASMA: Lactic Acid, Venous: 1.3 mmol/L (ref 0.5–1.9)

## 2021-05-03 LAB — ETHANOL: Alcohol, Ethyl (B): <10 mg/dL (ref <10)

## 2021-05-03 MED ORDER — HYDROCODONE-ACETAMINOPHEN 5-325 MG PO TABS
1.0000 | ORAL_TABLET | Freq: Once | ORAL | Status: AC
  Administered 2021-05-03: 1 via ORAL
  Filled 2021-05-03: qty 1

## 2021-05-03 NOTE — ED Provider Notes (Signed)
[facility] EMERGENCY DEPARTMENT Provider Note   CSN: [id number] Arrival date & time: 05/03/21  2040     History Chief Complaint  Patient presents with   [name] is a 60 y.o. female.  The history is provided by the patient, the EMS personnel and medical records.  Fall This is a new problem. The current episode started less than 1 hour ago. The problem occurs constantly. The problem has been resolved. Pertinent negatives include no chest pain, no abdominal pain, no headaches and no shortness of breath. Nothing aggravates the symptoms. Nothing relieves the symptoms. She has tried nothing for the symptoms.   Patient presents as a level 2 trauma activation after she had 2 separate falls today at home. She takes Eliquis for previous DVT. Patient tells me that she has been having to wear a forward offloading shoe since getting treatment for a chronic right foot wound.  This shoe has been causing her difficulty with walking.  She has been tripping multiple times since wearing the shoe.  She fell once today while making dinner however caught herself on the counter not causing injury.  She then had another fall completely to the ground hitting her face.  No LOC.  She was able to stand up after the fall and was ambulatory on scene. She describes a fall as mechanical as she tripped on the shoe that she has to wear. The time my exam she was complaining of right elbow pain. Denies vision changes, weakness, dizziness, lightheadedness, chest pain, abdominal pain, difficulty breathing or chest pain.      Past Medical History:  Diagnosis Date   CHF (congestive heart failure) ([name])    COPD (chronic obstructive pulmonary disease) ([name])    Diabetes mellitus without complication ([place])    DVT (deep venous thrombosis) ([place])    Hypertension    Renal disorder     There are no problems to display for this patient.   History reviewed. No pertinent surgical  history.   OB History   No obstetric history on file.     History reviewed. No pertinent family history.     Home Medications Prior to Admission medications   Not on File    Allergies    Gabapentin  Review of Systems   Review of Systems  Constitutional:  Negative for chills and fever.  HENT:  Negative for ear pain and sore throat.   Eyes:  Negative for pain and visual disturbance.  Respiratory:  Negative for cough and shortness of breath.   Cardiovascular:  Negative for chest pain and palpitations.  Gastrointestinal:  Negative for abdominal pain and vomiting.  Genitourinary:  Negative for dysuria and hematuria.  Musculoskeletal:  Positive for arthralgias. Negative for back pain.  Skin:  Positive for wound. Negative for color change and rash.  Neurological:  Negative for dizziness, tremors, seizures, syncope, facial asymmetry, speech difficulty, weakness, light-headedness, numbness and headaches.  All other systems reviewed and are negative.  Physical Exam Updated Vital Signs BP 127/75   Pulse (!) 59   Temp 97.8 F (36.6 C) (Oral)   Resp (!) 23   Ht '5\' 2"'$  (1.575 m)   Wt 99.8 kg   LMP  (LMP Unknown)   SpO2 94%   BMI 40.24 kg/m   Physical Exam Vitals and nursing note reviewed. Exam conducted with a chaperone present.  Constitutional:      General: She is not in acute distress.    Appearance:  She is well-developed. She is obese. She is ill-appearing (chronically).  HENT:     Head: Normocephalic.      Comments:  Midface stable    Right Ear: Tympanic membrane normal.     Left Ear: Tympanic membrane normal.     Nose:     Right Nostril: No epistaxis or septal hematoma.     Left Nostril: No epistaxis or septal hematoma.     Mouth/Throat:     Mouth: No injury.  Eyes:     General: No visual field deficit.    Extraocular Movements: Extraocular movements intact.     Conjunctiva/sclera: Conjunctivae normal.     Pupils: Pupils are equal.     Right eye: Pupil is  round and reactive.     Left eye: Pupil is round and reactive.  Neck:     Comments:  Cervical collar in place. No midline TTP. Slight right sided neck tenderness to palpation. No overlying wounds or rashes. Cardiovascular:     Rate and Rhythm: Normal rate and regular rhythm.     Pulses:          Radial pulses are 2+ on the right side and 2+ on the left side.       Dorsalis pedis pulses are 1+ on the right side and 1+ on the left side.     Heart sounds: No murmur heard. Pulmonary:     Effort: Pulmonary effort is normal. No respiratory distress.     Breath sounds: Normal breath sounds.  Chest:     Chest wall: No deformity, swelling, tenderness or crepitus.  Abdominal:     General: Abdomen is flat.     Palpations: Abdomen is soft.     Tenderness: There is no abdominal tenderness.  Musculoskeletal:     Comments: RUE: Superficial skin tear over elbow without active bleeding. Full ROM of the elbow w/o bony TTP. Distally NV intact.  RLE: Small, superficial abrasion over the right patella. Full active ROM of the R hip, knee and ankle S/p distal forefoot amputation. Small chronic appearing wound on the plantar aspect of the foot without drainage.  Skin:    General: Skin is warm and dry.  Neurological:     General: No focal deficit present.     Mental Status: She is alert and oriented to person, place, and time.     GCS: GCS eye subscore is 4. GCS verbal subscore is 5. GCS motor subscore is 6.     Cranial Nerves: Cranial nerves are intact.     Sensory: Sensation is intact.     Motor: Motor function is intact.     Coordination: Coordination is intact.    ED Results / Procedures / Treatments   Labs (all labs ordered are listed, but only abnormal results are displayed) Labs Reviewed  COMPREHENSIVE METABOLIC PANEL - Abnormal; Notable for the following components:      Result Value   Sodium 132 (*)    CO2 21 (*)    Glucose, Bld 213 (*)    BUN 50 (*)    Creatinine, Ser 2.66 (*)     Calcium 8.2 (*)    Albumin 2.7 (*)    Alkaline Phosphatase 176 (*)    GFR, Estimated 20 (*)    All other components within normal limits  CBC - Abnormal; Notable for the following components:   WBC 15.8 (*)    RBC 3.54 (*)    Hemoglobin 10.1 (*)    HCT 33.1 (*)  All other components within normal limits  PROTIME-INR - Abnormal; Notable for the following components:   Prothrombin Time 16.2 (*)    INR 1.3 (*)    All other components within normal limits  I-STAT CHEM 8, ED - Abnormal; Notable for the following components:   Sodium 133 (*)    BUN 45 (*)    Creatinine, Ser 2.80 (*)    Glucose, Bld 208 (*)    Calcium, Ion 0.97 (*)    Hemoglobin 10.2 (*)    HCT 30.0 (*)    All other components within normal limits  RESP PANEL BY RT-PCR (FLU A&B, COVID) ARPGX2  ETHANOL  LACTIC ACID, PLASMA  APTT  URINALYSIS, ROUTINE W REFLEX MICROSCOPIC  SAMPLE TO BLOOD BANK    EKG EKG Interpretation  Date/Time:  Monday May 03 2021 20:48:00 EDT Ventricular Rate:  61 PR Interval:  174 QRS Duration: 116 QT Interval:  464 QTC Calculation: 468 R Axis:   28 Text Interpretation: Sinus rhythm Incomplete left bundle branch block Low voltage, precordial leads Confirmed by Elnora Morrison [phone]) on 05/03/2021 8:51:08 PM  Radiology DG Elbow Complete Right  Result Date: 05/03/2021 CLINICAL DATA:  Status post fall.  Trauma EXAM: RIGHT ELBOW - COMPLETE 3+ VIEW COMPARISON:  02/09/2021 FINDINGS: No joint effusion. No fracture or dislocation identified. Remote healed radial head fracture. IMPRESSION: 1. No acute findings. 2. Remote healed radial head fracture. Electronically Signed   By: Kerby Moors M.D.   On: 05/03/2021 21:23   DG Chest Port 1 View  Result Date: 05/03/2021 CLINICAL DATA:  Chest pain following fall, initial encounter EXAM: PORTABLE CHEST 1 VIEW COMPARISON:  01/01/2021 FINDINGS: Cardiac shadow is prominent but stable. CardioMEMS device is again identified. Lungs are clear bilaterally.  No acute bony abnormality is seen. IMPRESSION: No acute abnormality noted. Electronically Signed   By: Inez Catalina M.D.   On: 05/03/2021 21:02   DG Knee Right Port  Result Date: 05/03/2021 CLINICAL DATA:  Status post fall. EXAM: PORTABLE RIGHT KNEE - 1-2 VIEW COMPARISON:  None. FINDINGS: The bones appear osteopenic. Diffuse soft tissue swelling. No joint effusion identified. Moderate tricompartment joint space narrowing and marginal spur formation compatible with degenerative joint disease. Several loose bodies noted within the posterior joint space. IMPRESSION: 1. No acute findings. 2. Moderate tricompartment degenerative joint disease. 3. Diffuse soft tissue swelling. Electronically Signed   By: Kerby Moors M.D.   On: 05/03/2021 21:20    Procedures Procedures   Medications Ordered in ED Medications - No data to display  ED Course  I have reviewed the triage vital signs and the nursing notes.  Pertinent labs & imaging results that were available during my care of the patient were reviewed by me and considered in my medical decision making (see chart for details).  Clinical Course as of 05/03/21 2334  Mon May 03, 2021  2235 No acute MSK injuries on plain films.  CT imaging still pending. [ZB]  2256 Tetanus immunization is UTD as of 2020 [ZB]  2320 Renal fxn at [facility]. No acute electrolyte abnormalities that required emergent tx. [ZB]  2331 Handoff to oncoming team pending CT imaging. If negative, can safely be discharged home with outpatient follow up. [ZB]    Clinical Course User Index [ZB] Pearson Grippe, DO   MDM Rules/Calculators/A&P                         This is a 60 year old female with multiple comorbidities  including previous DVT anticoagulated on Eliquis who presented to the emergency department as likely level 2 trauma after a described mechanical ground-level fall from standing in which she struck her face. There was no LOC.  She was ambulatory on scene after the  fall.  The emergency department she was hemodynamically stable with a nonfocal neurologic exam.  Primary concern is traumatic ICH.  Ordered for CT head and CT cervical spine. Also getting plain films of the right elbow and right knee however much lower suspicion for musculoskeletal injury.  Though considered, I do not suspect any preceding cause for her fall other than a tripped on the offloading shoe that she has been wearing on her right lower extremity.  See ED clinical course above for further medical decision making.  Final Clinical Impression(s) / ED Diagnoses Final diagnoses:  Fall    Rx / DC Orders ED Discharge Orders     None        Pearson Grippe, DO 05/03/21 2335    Elnora Morrison, MD 05/04/21 [phone]

## 2021-05-03 NOTE — ED Provider Notes (Signed)
60 year old female received at signout from Dr. Jenean Lindau, [place] Resident, working with Dr. Reather Converse, [place]. Per his HPI:  "The history is provided by the patient, the EMS personnel and medical records.  Fall This is a new problem. The current episode started less than 1 hour ago. The problem occurs constantly. The problem has been resolved. Pertinent negatives include no chest pain, no abdominal pain, no headaches and no shortness of breath. Nothing aggravates the symptoms. Nothing relieves the symptoms. She has tried nothing for the symptoms.    Patient presents as a level 2 trauma activation after she had 2 separate falls today at home. She takes Eliquis for previous DVT. Patient tells me that she has been having to wear a forward offloading shoe since getting treatment for a chronic right foot wound.  This shoe has been causing her difficulty with walking.  She has been tripping multiple times since wearing the shoe.  She fell once today while making dinner however caught herself on the counter not causing injury.  She then had another fall completely to the ground hitting her face.  No LOC.  She was able to stand up after the fall and was ambulatory on scene. She describes a fall as mechanical as she tripped on the shoe that she has to wear. The time my exam she was complaining of right elbow pain. Denies vision changes, weakness, dizziness, lightheadedness, chest pain, abdominal pain, difficulty breathing or chest pain."  On my review, patient reports that she has had a 10 to 15 pound weight gain over the last 2 to 3 weeks.  She had her home torsemide increased to 80 mg twice daily, but has continued to gain weight.  She also endorses abdominal swelling and lower extremity edema.  She has been wheezing, but denies increased shortness of breath.  Denies worsening dyspnea on exertion.  The patient sleeps sitting up in a recliner, which is her baseline.   Physical Exam  BP 138/61   Pulse 62   Temp 97.8  F (36.6 C) (Oral)   Resp (!) 24   Ht '5\' 2"'$  (1.575 m)   Wt 99.8 kg   LMP  (LMP Unknown)   SpO2 93%   BMI 40.24 kg/m   Physical Exam Vitals and nursing note reviewed.  Constitutional:      General: She is not in acute distress.    Appearance: She is obese.     Comments: Chronically ill-appearing  HENT:     Head: Normocephalic.     Comments: Hematoma noted above the right eyebrow.    Nose:     Comments: Superficial abrasion noted across the bridge of the nose Eyes:     Conjunctiva/sclera: Conjunctivae normal.  Cardiovascular:     Rate and Rhythm: Normal rate and regular rhythm.     Heart sounds: No murmur heard.   No friction rub. No gallop.  Pulmonary:     Effort: Pulmonary effort is normal. No respiratory distress.     Breath sounds: Wheezing present.     Comments: Inspiratory and expiratory wheezes noted bilaterally in all fields. Abdominal:     General: There is no distension.     Palpations: Abdomen is soft.     Tenderness: There is no abdominal tenderness.  Musculoskeletal:     Cervical back: Neck supple.     Right lower leg: Edema present.     Left lower leg: Edema present.     Comments: Pitting edema bilaterally.  There is erythema noted  to the right lower leg and stump of the right foot from a previous amputation.  No red streaking, warmth, or drainage.  Muscular compartments are soft.  Skin:    General: Skin is warm.     Findings: No rash.     Comments: Abrasion to the right elbow  Neurological:     Mental Status: She is alert.  Psychiatric:        Behavior: Behavior normal.    ED Course/Procedures   Clinical Course as of 05/04/21 0122  Mon May 03, 2021  2235 No acute MSK injuries on plain films.  CT imaging still pending. [ZB]  2256 Tetanus immunization is UTD as of 2020 [ZB]  2320 Renal fxn at [facility]. No acute electrolyte abnormalities that required emergent tx. [ZB]  2331 Handoff to oncoming team pending CT imaging. If negative, can safely be  discharged home with outpatient follow up. [ZB]    Clinical Course User Index [ZB] Pearson Grippe, DO    Procedures  MDM  60 year old female received in signout pending CT imaging after she presented to the ER for mechanical fall.  Vital signs are stable.  Labs and imaging of been reviewed and independently interpreted by me.  CT head and cervical spine are unremarkable.  No acute findings.  In reviewing the patient's labs, she has an AKI. Creatinine is 2.66 today.  Most recently 1.85 when checked on 6/14.  This is uptrending from 1.38 in April of this year.  She is endorsing a 10 to 15 pound weight gain to me despite increasing her home torsemide to 80 mg twice daily within the last few weeks.  She denies any other recent medication adjustments.  Question CHF exacerbation she has pitting edema noted to the bilateral lower extremities and has been endorsing a 10 to 15 pound weight gain.  However, she does deny any shortness of breath or orthopnea.  BNP is pending.  Chest x-ray has been reviewed and compared to previous imaging by me with no pulmonary edema.   Discussed the patient with Dr. Dina Rich, attending physician, as patient will require admission for AKI (possibly 2/2 to torsemide increase) and concern for CHF exacerbation.  Less likely nephrotic syndrome.  Consult to the hospitalist team and Dr. [place] Crane will accept the patient for admission. The patient appears reasonably stabilized for admission considering the current resources, flow, and capabilities available in the ED at this time, and I doubt any other [facility] requiring further screening and/or treatment in the ED prior to admission.    Joanne Gavel, PA-C 05/04/21 0129    Merryl Hacker, MD 05/04/21 [phone]

## 2021-05-03 NOTE — ED Notes (Signed)
Helped patient call out to her friend, Dorena Cookey who she lives with to let her know that she was okay. [phone]

## 2021-05-03 NOTE — ED Triage Notes (Addendum)
Patient from home, two ground level falls, tripped on rug and hit head right above right eye, hematoma above right eye.  Skin tear to right elbow.  Patient on eliquis.

## 2021-05-04 ENCOUNTER — Encounter (HOSPITAL_BASED_OUTPATIENT_CLINIC_OR_DEPARTMENT_OTHER): Payer: Medicare Other | Attending: Internal Medicine | Admitting: Internal Medicine

## 2021-05-04 DIAGNOSIS — L8990 Pressure ulcer of unspecified site, unspecified stage: Secondary | ICD-10-CM | POA: Insufficient documentation

## 2021-05-04 DIAGNOSIS — N179 Acute kidney failure, unspecified: Secondary | ICD-10-CM | POA: Diagnosis present

## 2021-05-04 LAB — GLUCOSE, CAPILLARY
Glucose-Capillary: 196 mg/dL — ABNORMAL HIGH (ref 70–99)
Glucose-Capillary: 227 mg/dL — ABNORMAL HIGH (ref 70–99)
Glucose-Capillary: 181 mg/dL — ABNORMAL HIGH (ref 70–99)
Glucose-Capillary: 237 mg/dL — ABNORMAL HIGH (ref 70–99)

## 2021-05-04 LAB — BASIC METABOLIC PANEL
Anion gap: 11 (ref 5–15)
BUN: 47 mg/dL — ABNORMAL HIGH (ref 6–20)
CO2: 24 mmol/L (ref 22–32)
Calcium: 8.5 mg/dL — ABNORMAL LOW (ref 8.9–10.3)
Chloride: 102 mmol/L (ref 98–111)
Creatinine, Ser: 2.35 mg/dL — ABNORMAL HIGH (ref 0.44–1.00)
GFR, Estimated: 23 mL/min — ABNORMAL LOW (ref >60)
Glucose, Bld: 101 mg/dL — ABNORMAL HIGH (ref 70–99)
Potassium: 4.2 mmol/L (ref 3.5–5.1)
Sodium: 137 mmol/L (ref 135–145)

## 2021-05-04 LAB — RESP PANEL BY RT-PCR (FLU A&B, COVID) ARPGX2
Influenza A by PCR: NEGATIVE
Influenza B by PCR: NEGATIVE
SARS Coronavirus 2 by RT PCR: NEGATIVE

## 2021-05-04 LAB — CBC
HCT: 32 % — ABNORMAL LOW (ref 36.0–46.0)
HCT: 32.6 % — ABNORMAL LOW (ref 36.0–46.0)
Hemoglobin: 9.5 g/dL — ABNORMAL LOW (ref 12.0–15.0)
Hemoglobin: 9.6 g/dL — ABNORMAL LOW (ref 12.0–15.0)
MCH: 27.6 pg (ref 26.0–34.0)
MCH: 27.7 pg (ref 26.0–34.0)
MCHC: 29.7 g/dL — ABNORMAL LOW (ref 30.0–36.0)
MCHC: 29.4 g/dL — ABNORMAL LOW (ref 30.0–36.0)
MCV: 93 fL (ref 80.0–100.0)
MCV: 94.2 fL (ref 80.0–100.0)
Platelets: 325 10*3/uL (ref 150–400)
Platelets: 353 10*3/uL (ref 150–400)
RBC: 3.44 MIL/uL — ABNORMAL LOW (ref 3.87–5.11)
RBC: 3.46 MIL/uL — ABNORMAL LOW (ref 3.87–5.11)
RDW: 15.3 % (ref 11.5–15.5)
RDW: 15.3 % (ref 11.5–15.5)
WBC: 12.6 10*3/uL — ABNORMAL HIGH (ref 4.0–10.5)
WBC: 11.9 10*3/uL — ABNORMAL HIGH (ref 4.0–10.5)
nRBC: 0 % (ref 0.0–0.2)
nRBC: 0 % (ref 0.0–0.2)

## 2021-05-04 LAB — CREATININE, SERUM
Creatinine, Ser: 2.46 mg/dL — ABNORMAL HIGH (ref 0.44–1.00)
GFR, Estimated: 22 mL/min — ABNORMAL LOW (ref >60)

## 2021-05-04 LAB — MAGNESIUM: Magnesium: 2.6 mg/dL — ABNORMAL HIGH (ref 1.7–2.4)

## 2021-05-04 LAB — BRAIN NATRIURETIC PEPTIDE: B Natriuretic Peptide: 761.3 pg/mL — ABNORMAL HIGH (ref 0.0–100.0)

## 2021-05-04 LAB — PHOSPHORUS: Phosphorus: 5.6 mg/dL — ABNORMAL HIGH (ref 2.5–4.6)

## 2021-05-04 LAB — HIV ANTIBODY (ROUTINE TESTING W REFLEX): HIV Screen 4th Generation wRfx: NONREACTIVE

## 2021-05-04 MED ORDER — APIXABAN 5 MG PO TABS
5.0000 mg | ORAL_TABLET | Freq: Two times a day (BID) | ORAL | Status: DC
  Administered 2021-05-04 – 2021-05-05 (×3): 5 mg via ORAL
  Filled 2021-05-04 (×3): qty 1

## 2021-05-04 MED ORDER — NICOTINE 14 MG/24HR TD PT24
14.0000 mg | MEDICATED_PATCH | Freq: Every day | TRANSDERMAL | Status: DC
  Administered 2021-05-04 – 2021-05-05 (×3): 14 mg via TRANSDERMAL
  Filled 2021-05-04 (×3): qty 1

## 2021-05-04 MED ORDER — FUROSEMIDE 10 MG/ML IJ SOLN
40.0000 mg | Freq: Two times a day (BID) | INTRAMUSCULAR | Status: DC
  Administered 2021-05-04 – 2021-05-05 (×3): 40 mg via INTRAVENOUS
  Filled 2021-05-04 (×3): qty 4

## 2021-05-04 MED ORDER — ENOXAPARIN SODIUM 30 MG/0.3ML IJ SOSY: 30.0000 mg | PREFILLED_SYRINGE | Freq: Every day | INTRAMUSCULAR | Status: DC

## 2021-05-04 MED ORDER — INSULIN ASPART 100 UNIT/ML IJ SOLN
0.0000 [IU] | Freq: Three times a day (TID) | INTRAMUSCULAR | Status: DC
  Administered 2021-05-04: 3 [IU] via SUBCUTANEOUS
  Administered 2021-05-04 – 2021-05-05 (×3): 2 [IU] via SUBCUTANEOUS
  Administered 2021-05-05: 7 [IU] via SUBCUTANEOUS

## 2021-05-04 MED ORDER — OXYCODONE HCL 5 MG PO TABS
5.0000 mg | ORAL_TABLET | Freq: Four times a day (QID) | ORAL | Status: DC | PRN
  Administered 2021-05-04 – 2021-05-05 (×4): 5 mg via ORAL
  Filled 2021-05-04 (×4): qty 1

## 2021-05-04 MED ORDER — PANTOPRAZOLE SODIUM 40 MG PO TBEC
40.0000 mg | DELAYED_RELEASE_TABLET | Freq: Every day | ORAL | Status: DC
  Administered 2021-05-04 – 2021-05-05 (×2): 40 mg via ORAL
  Filled 2021-05-04 (×2): qty 1

## 2021-05-04 MED ORDER — ACETAMINOPHEN 325 MG PO TABS: 650.0000 mg | ORAL_TABLET | Freq: Four times a day (QID) | ORAL | Status: DC | PRN

## 2021-05-04 MED ORDER — CEPHALEXIN 500 MG PO CAPS
500.0000 mg | ORAL_CAPSULE | Freq: Three times a day (TID) | ORAL | Status: DC
  Administered 2021-05-04 – 2021-05-05 (×5): 500 mg via ORAL
  Filled 2021-05-04 (×5): qty 1

## 2021-05-04 MED ORDER — SENNOSIDES-DOCUSATE SODIUM 8.6-50 MG PO TABS
1.0000 | ORAL_TABLET | Freq: Every day | ORAL | Status: DC
  Administered 2021-05-04: 1 via ORAL
  Filled 2021-05-04: qty 1

## 2021-05-04 MED ORDER — FLUOXETINE HCL 10 MG PO CAPS
10.0000 mg | ORAL_CAPSULE | Freq: Every day | ORAL | Status: DC
  Administered 2021-05-04: 10 mg via ORAL
  Filled 2021-05-04: qty 1

## 2021-05-04 MED ORDER — ATORVASTATIN CALCIUM 40 MG PO TABS
40.0000 mg | ORAL_TABLET | Freq: Every day | ORAL | Status: DC
  Administered 2021-05-04 – 2021-05-05 (×2): 40 mg via ORAL
  Filled 2021-05-04 (×2): qty 1

## 2021-05-04 MED ORDER — IPRATROPIUM-ALBUTEROL 0.5-2.5 (3) MG/3ML IN SOLN
3.0000 mL | Freq: Once | RESPIRATORY_TRACT | Status: AC
  Administered 2021-05-04: 3 mL via RESPIRATORY_TRACT
  Filled 2021-05-04: qty 3

## 2021-05-04 MED ORDER — MELATONIN 3 MG PO TABS
3.0000 mg | ORAL_TABLET | Freq: Every evening | ORAL | Status: DC | PRN
  Administered 2021-05-04: 3 mg via ORAL
  Filled 2021-05-04: qty 1

## 2021-05-04 MED ORDER — LEVOTHYROXINE SODIUM 50 MCG PO TABS
50.0000 ug | ORAL_TABLET | Freq: Every day | ORAL | Status: DC
  Administered 2021-05-04 – 2021-05-05 (×2): 50 ug via ORAL
  Filled 2021-05-04 (×2): qty 1

## 2021-05-04 MED ORDER — INSULIN ASPART 100 UNIT/ML IJ SOLN
0.0000 [IU] | Freq: Every day | INTRAMUSCULAR | Status: DC
  Administered 2021-05-04: 2 [IU] via SUBCUTANEOUS

## 2021-05-04 MED ORDER — ONDANSETRON HCL 4 MG/2ML IJ SOLN: 4.0000 mg | Freq: Four times a day (QID) | INTRAMUSCULAR | Status: DC | PRN

## 2021-05-04 MED ORDER — FUROSEMIDE 10 MG/ML IJ SOLN
80.0000 mg | Freq: Once | INTRAMUSCULAR | Status: AC
  Administered 2021-05-04: 80 mg via INTRAVENOUS
  Filled 2021-05-04: qty 8

## 2021-05-04 MED ORDER — POLYETHYLENE GLYCOL 3350 17 G PO PACK: 17.0000 g | PACK | Freq: Every day | ORAL | Status: DC | PRN

## 2021-05-04 NOTE — Progress Notes (Signed)
Nicotine patch applied and removed the old one.

## 2021-05-04 NOTE — Evaluation (Signed)
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  She presented after 2 falls at home, imaging was negative. 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  Clinical Impression  PTA pt living with sister and roommate in multistory home with level entry. Pt able to live on main floor. Pt reports using a cane for household ambulation but also endorses multiple falls. Pt's sister assists with bathing and dressing and all iADLs. Pt is currently limited in safe mobility by bilateral LE neuropathic pain, decreased balance secondary to offset shore for recent R foot transmetatarsal amputation all in presence of decreased strength and endurance. Pt is currently min guard for transfers and min A for short distance ambulation. PT recommending HHPT at discharge to improve strength and balance to safely navigate in her home environment. PT will continue to follow acutely.     Follow Up Recommendations Home health PT;Supervision for mobility/OOB    Equipment Recommendations  Rolling walker with 5" wheels    Recommendations for Other Services OT consult     Precautions / Restrictions Precautions Precautions: Fall Precaution Comments: RT off loading shoe Restrictions Weight Bearing Restrictions: No      Mobility  Bed Mobility Overal bed mobility: Modified Independent             General bed mobility comments: OOB working with OT    Transfers Overall transfer level: Needs assistance Equipment used: Rolling walker (2 wheeled) Transfers: Sit to/from Stand Sit to Stand: Supervision Stand pivot transfers: Min guard       General transfer comment: supervision for safety, vc for hand placement for power up  from recliner  Ambulation/Gait Ambulation/Gait assistance: Min assist Gait Distance (Feet): 80 Feet Assistive device: Rolling walker (2 wheeled) Gait Pattern/deviations: Decreased stance time - right;Decreased weight shift to right;Step-through pattern;Trunk flexed;Antalgic Gait velocity: too fast for condition Gait velocity interpretation: <1.8 ft/sec, indicate of risk for recurrent falls General Gait Details: light min A for steadying, constant multimodal cuing for proximity to RW and verbal cues for slowing down, pt with c/o of increased peripheral neuropathy pain with advancement of gait. Pt responds with decreased weightshift to R LE and increased UE support      Balance Overall balance assessment: History of Falls;Needs assistance Sitting-balance support: No upper extremity supported;Feet unsupported Sitting balance-Leahy Scale: Good     Standing balance support: Bilateral upper extremity supported Standing balance-Leahy Scale: Poor Standing balance comment: Fair static at sink, but poor dynamic with need of RW.                             Pertinent Vitals/Pain Pain Assessment: 0-10 Pain Score: 7  Pain Descriptors / Indicators: Pins and needles;Sharp;Burning Pain Intervention(s): Limited activity within patient's tolerance;Monitored during session;Repositioned    Home Living Family/patient expects to be discharged to:: Private residence Living Arrangements: Other relatives (Sister) Available Help at Discharge: Family;Available 24 hours/day (Lives with one sister and a 2nd sister is next door. Pt reports 24/7 assistance except when sister leaves for 1-2 hours for the [employer]) Type of Home: Apartment Home Access: Level entry     Home Layout: Two level Home Equipment: Walker - 2 wheels;Walker - 4 wheels;Cane - quad;Grab bars - toilet;Hospital  bed;Wheelchair - power;Shower seat;Bedside commode      Prior Function Level of Independence: Needs assistance   Gait /  Transfers Assistance Needed: Pt reports that she uses her Quad cane most of the time.  Pt reports 1 previous falls before the 2 prior to admission after drinking "chocolate wine."  ADL's / Homemaking Assistance Needed: Pt's sister assists with toileting/hygiene, LE dressing, showers and setup of clothing. Sisters perform all IADLs, assist pt with medication management and provides transportation.        Hand Dominance   Dominant Hand: Left    Extremity/Trunk Assessment   Upper Extremity Assessment Upper Extremity Assessment: Defer to OT evaluation    Lower Extremity Assessment Lower Extremity Assessment: RLE deficits/detail;LLE deficits/detail RLE Deficits / Details: R transmetatarsal amputation, area of cellulitis on calf and ankle RLE Sensation: history of peripheral neuropathy LLE Deficits / Details: generalized weakness LLE Sensation: history of peripheral neuropathy       Communication   Communication: No difficulties  Cognition Arousal/Alertness: Awake/alert Behavior During Therapy: WFL for tasks assessed/performed Overall Cognitive Status: Within Functional Limits for tasks assessed                                 General Comments: Pt oriented to person, place, situation and year. Not to month.      General Comments General comments (skin integrity, edema, etc.): VSS on RA, Area of cellulitis on R calf        Assessment/Plan    PT Assessment Patient needs continued PT services  PT Problem List Decreased strength;Decreased activity tolerance;Decreased balance;Decreased mobility;Decreased cognition;Decreased coordination;Decreased safety awareness;Decreased knowledge of use of DME;Impaired sensation;Pain;Decreased skin integrity       PT Treatment Interventions DME instruction;Gait training;Functional mobility training;Therapeutic activities;Therapeutic exercise;Balance training;Cognitive remediation;Patient/family education    PT Goals (Current  goals can be found in the Care Plan section)  Acute Rehab PT Goals Patient Stated Goal: To be able to walk well enough to return to [facility] with sister. PT Goal Formulation: With patient Time For Goal Achievement: 05/18/21 Potential to Achieve Goals: Fair    Frequency Min 3X/week    AM-PAC PT "6 Clicks" Mobility  Outcome Measure Help needed turning from your back to your side while in a flat bed without using bedrails?: None Help needed moving from lying on your back to sitting on the side of a flat bed without using bedrails?: None Help needed moving to and from a bed to a chair (including a wheelchair)?: A Little Help needed standing up from a chair using your arms (e.g., wheelchair or bedside chair)?: A Little Help needed to walk in hospital room?: A Little Help needed climbing 3-5 steps with a railing? : A Lot 6 Click Score: 19    End of Session Equipment Utilized During Treatment: Gait belt Activity Tolerance: Patient limited by pain Patient left: in chair;with call bell/phone within reach;with chair alarm set Nurse Communication: Mobility status PT Visit Diagnosis: Unsteadiness on feet (R26.81);Other abnormalities of gait and mobility (R26.89);Muscle weakness (generalized) (M62.81);Difficulty in walking, not elsewhere classified (R26.2);Pain;Other symptoms and signs involving the nervous system (R29.898) Pain - Right/Left: Right Pain - part of body: Leg;Ankle and joints of foot    Time: [id number] PT Time Calculation (min) (ACUTE ONLY): 20 min   Charges:   PT Evaluation $PT Eval Moderate Complexity: 1 Mod          Haedyn Ancrum B. Migdalia Dk PT, DPT Acute  Rehabilitation Services Pager [phone] Office [phone]   [place] 05/04/2021, 11:24 AM

## 2021-05-04 NOTE — Discharge Instructions (Signed)

## 2021-05-04 NOTE — Evaluation (Signed)
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  She presented after 2 falls at home, imaging was negative. 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   Clinical Impression   Patient is currently requiring assistance with ADLs including minimal assist with toileting, with LE dressing, and with LE bathing bathing, and setup assist with UE dressing and supervision and cues for safety with standing grooming, which may be pt's typical baseline at home.  During this evaluation, patient was limited by pain to face with RLE, generalized weakness, limited activity tolerance, and quick, unsafe mobility, which has the potential to impact patient's safety and independence during functional mobility, as well as performance for ADLs. [place] "6-clicks" Daily Activity Inpatient Short Form score of 19/24 this session. Patient lives with her sister, who is able to provide near 24/7 supervision and assistance.  Patient demonstrates fair rehab potential, and should benefit from continued skilled occupational therapy services while in acute care to maximize safety, independence and quality of life at home.  Continued occupational therapy services in the home is recommended.  ?    Follow Up Recommendations  Home health OT    Equipment Recommendations  None recommended by OT    Recommendations for Other Services       Precautions / Restrictions Precautions Precautions: Fall Precaution Comments: RT off loading shoe Restrictions Weight Bearing Restrictions: No      Mobility Bed Mobility Overal bed mobility: Modified Independent             General bed mobility comments: HOB elevated     Transfers Overall transfer level: Needs assistance Equipment used: Rolling walker (2 wheeled) Transfers: Sit to/from [facility] Sit to Stand: Supervision Stand pivot transfers: Min guard       General transfer comment: Cues to slow down.    Balance Overall balance assessment: History of Falls;Needs assistance Sitting-balance support: No upper extremity supported;Feet unsupported Sitting balance-Leahy Scale: Good     Standing balance support: Bilateral upper extremity supported Standing balance-Leahy Scale: Poor Standing balance comment: Fair static at sink, but poor dynamic with need of RW.                           ADL either performed or assessed with clinical judgement   ADL Overall ADL's : Needs assistance/impaired Eating/Feeding: Independent   Grooming: Wash/dry hands;Standing;Supervision/safety Grooming Details (indicate cue type and reason): Needs cues for slow, controlled movements. Upper Body Bathing: Set up;Sitting Upper Body Bathing Details (indicate cue type and reason): At EOB Lower Body Bathing: Sitting/lateral leans;Sit to/from stand;Min guard   Upper Body Dressing : Set up;Sitting Upper Body Dressing Details (indicate cue type and reason): Assist to tie hospital gown in the back only. Lower Body Dressing: Minimal assistance Lower Body Dressing Details (indicate cue type and reason): Increased time/effort. Assist needed to don RT sock and RT off-loading shoe. Pt able to don LT sock at EOB. Pt stood and unsafely stepped out of diaper brief that fell to floor once standing. Pt cued to sit to do this in the future for increased safety. Toilet Transfer: BSC;Ambulation;RW Toilet Transfer Details (indicate cue type and reason): BSC on top of toilet. Pt tansfered with supervision. During  ambulation to bathroom with RW, pt cued on slow, controlled movement, walker proximity and sequencing steps. Toileting- Clothing Manipulation and  Hygiene: Minimal assistance Toileting - Clothing Manipulation Details (indicate cue type and reason): Min As for thorough hygiene and clothign management.     Functional mobility during ADLs: Supervision/safety;Rolling walker;Min guard       Vision Baseline Vision/History: Wears glasses Wears Glasses: Reading only Patient Visual Report: No change from baseline Additional Comments: Pt reports a doctor gives her LT eye shots but pt does not know why except "I think for diabetes."     Perception     Praxis      Pertinent Vitals/Pain Pain Assessment: 0-10 Pain Score: 7  Pain Descriptors / Indicators: Tender Pain Intervention(s): Limited activity within patient's tolerance;Monitored during session;Other (comment) (Pt reports that she received pain meds earlier and pain coming down.)     Hand Dominance Left   Extremity/Trunk Assessment Upper Extremity Assessment Upper Extremity Assessment: Generalized weakness   Lower Extremity Assessment Lower Extremity Assessment: Defer to PT evaluation       Communication Communication Communication: No difficulties   Cognition Arousal/Alertness: Awake/alert Behavior During Therapy: WFL for tasks assessed/performed Overall Cognitive Status: Within Functional Limits for tasks assessed                                 General Comments: Pt oriented to person, place, situation and year. Not to month.   General Comments       Exercises     Shoulder Instructions      Home Living Family/patient expects to be discharged to:: Private residence Living Arrangements: Other relatives (Sister) Available Help at Discharge: Family;Available 24 hours/day (Lives with one sister and a 2nd sister is next door. Pt reports 24/7 assistance except when sister leaves for 1-2 hours for the [employer]) Type of Home: Apartment Home Access: Level entry     Home Layout: Two level Alternate Level Stairs-Number of Steps: Pt reports that she sleeps  in a hospital bed on the main floor but there is no bathroom.  Pt must go up 8 steps total with a landing in the middle and a unilateral rail.   Bathroom Shower/Tub: Occupational psychologist: Handicapped height     Home Equipment: Environmental consultant - 2 wheels;Walker - 4 wheels;Cane - quad;Grab bars - toilet;Hospital bed;Wheelchair - power;Shower seat;Bedside commode          Prior Functioning/Environment Level of Independence: Needs assistance  Gait / Transfers Assistance Needed: Pt reports that she uses her Quad cane most of the time.  Pt reports 1 previous falls before the 2 prior to admission after drinking "chocolate wine." ADL's / Homemaking Assistance Needed: Pt's sister assists with toileting/hygiene, LE dressing, showers and setup of clothing. Sisters perform all IADLs, assist pt with medication management and provides transportation.            OT Problem List: Decreased strength;Pain;Decreased safety awareness;Impaired balance (sitting and/or standing);Decreased knowledge of precautions;Impaired vision/perception;Decreased knowledge of use of DME or AE;Decreased activity tolerance      OT Treatment/Interventions: Self-care/ADL training;Therapeutic exercise;Therapeutic activities;Energy conservation;DME and/or AE instruction;Patient/family education;Balance training    OT Goals(Current goals can be found in the care plan section) Acute Rehab OT Goals Patient Stated Goal: To be able to walk well enough to return to [facility] with sister. OT Goal Formulation: With patient Time For Goal Achievement: 05/18/21 Potential to Achieve Goals: Fair ADL Goals  Pt Will Perform Grooming: with modified independence;standing (3/3) Pt Will Perform Lower Body Dressing: with set-up;sitting/lateral leans;sit to/from stand;with adaptive equipment Pt Will Transfer to Toilet: with modified independence;ambulating (demonstrating safe mobility with slow, controlled movements) Pt Will Perform Toileting -  Clothing Manipulation and hygiene: with modified independence;with adaptive equipment;sitting/lateral leans;sit to/from stand Pt Will Perform Tub/Shower Transfer: with min guard assist (Will verbalize understanding to possible DME to increase safety.) Additional ADL Goal #1: Patient will identify at least 3 fall prevention strategies to employ at home in order to maximize function and quality of life and decrease caregiver burden while preventing injury and rehospitalization.  OT Frequency: Min 2X/week   Barriers to D/C: Inaccessible home environment          Co-evaluation              AM-PAC OT "6 Clicks" Daily Activity     Outcome Measure Help from another person eating meals?: None Help from another person taking care of personal grooming?: A Little Help from another person toileting, which includes using toliet, bedpan, or urinal?: A Little Help from another person bathing (including washing, rinsing, drying)?: A Little Help from another person to put on and taking off regular upper body clothing?: A Little Help from another person to put on and taking off regular lower body clothing?: A Little 6 Click Score: 19   End of Session Equipment Utilized During Treatment: Gait belt;Rolling walker  Activity Tolerance: Patient tolerated treatment well Patient left: in chair (Hand off to PT in room)  OT Visit Diagnosis: Unsteadiness on feet (R26.81);History of falling (Z91.81);Repeated falls (R29.6);Muscle weakness (generalized) (M62.81);Other abnormalities of gait and mobility (R26.89);Pain Pain - Right/Left: Right Pain - part of body: Leg (and face)                [id number] OT Time Calculation (min): 29 min Charges:  OT General Charges $OT Visit: 1 Visit OT Evaluation $OT Eval Low Complexity: 1 Low OT Treatments $Self Care/Home Management : 8-22 mins  Anderson Malta, OT Acute Rehab Services Office: [phone] 05/04/2021  Julien Girt 05/04/2021, 9:16 AM

## 2021-05-04 NOTE — TOC Initial Note (Addendum)
Transition of Care [facility]) - Initial/Assessment Note    Patient Details  Name: [name] MRN: [id number] Date of Birth: [birth year]  Transition of Care [facility] CM/SW Contact:    Carles Collet, RN Phone Number: 05/04/2021, 3:21 PM  Clinical Narrative:   Benefit check submitted for Eliquis- will need 30 day card or meds sent to [facility] pharmacy at [place].           Spoke w patient at bedside. She confirms that is from home, lives with her sister, and another sister lives next door. Sister drives and will be able to transport home.  She has all needed DME at home including Gilford Rile, [employer], [place]- quad;Grab bars - toilet;Hospital bed;Wheelchair - power;Shower seat;Bedside commode.  She is agreeable to [facility] services, with no preference for provider. Latricia Heft accepted referral.   Patient will need HH order for PT OT.         Expected Discharge Plan: [place] Barriers to Discharge: Continued Medical Work up   Patient Goals and CMS Choice Patient states their goals for this hospitalization and ongoing recovery are:: to go home CMS Medicare.gov Compare Post Acute Care list provided to:: Patient Choice offered to / list presented to : Patient  Expected Discharge Plan and Services Expected Discharge Plan: [place]   Discharge Planning Services: CM Consult Post Acute Care Choice: [place] arrangements for the past 2 months: Apartment                 DME Arranged: N/A         HH Arranged: PT, OT HH Agency: [place] Date HH Agency Contacted: 05/04/21 Time HH Agency Contacted: 1520 Representative spoke with at [place]: [name]  Prior Living Arrangements/Services Living arrangements for the past 2 months: [place] with:: Relatives              Current home services: DME    Activities of Daily Living      Permission Sought/Granted                  Emotional Assessment              Admission  diagnosis:  Fall [W19.XXXA] AKI (acute kidney injury) ([place]) [N17.9] Traumatic hematoma of forehead, initial encounter [S00.83XA] Volume overload state of heart [E87.79] Patient Active Problem List   Diagnosis Date Noted   AKI (acute kidney injury) ([place]) 05/04/2021   PCP:  [facility] [place]:  No Pharmacies Listed    Social Determinants of Health (SDOH) Interventions    Readmission Risk Interventions No flowsheet data found.

## 2021-05-04 NOTE — ED Notes (Signed)
Attempted report 

## 2021-05-04 NOTE — ED Notes (Signed)
Breakfast Orders placed 

## 2021-05-04 NOTE — Progress Notes (Signed)
ANTICOAGULATION CONSULT NOTE - Initial Consult  Pharmacy Consult for continuation of home Apixaban Indication: DVT  Allergies  Allergen Reactions   Gabapentin     Patient Measurements: Height: '5\' 2"'$  (157.5 cm) Weight: 99.8 kg (220 lb) IBW/kg (Calculated) : 50.1   Vital Signs: Temp: 97.8 F (36.6 C) (06/20 2101) Temp Source: Oral (06/20 2101) BP: 151/76 (06/21 0400) Pulse Rate: 65 (06/21 0400)  Labs: Recent Labs    05/03/21 2048 05/03/21 2053 05/04/21 0125 05/04/21 0312  HGB 10.1* 10.2* 9.5* 9.6*  HCT 33.1* 30.0* 32.0* 32.6*  PLT 330  --  325 353  APTT 35  --   --   --   LABPROT 16.2*  --   --   --   INR 1.3*  --   --   --   CREATININE 2.66* 2.80* 2.46* 2.35*    Estimated Creatinine Clearance: 28.8 mL/min (A) (by C-G formula based on SCr of 2.35 mg/dL (H)).   Medical History: Past Medical History:  Diagnosis Date   CHF (congestive heart failure) ([name])    COPD (chronic obstructive pulmonary disease) ([name])    Diabetes mellitus without complication ([name])    DVT (deep venous thrombosis) ([name])    Hypertension    Renal disorder       Assessment: 61 y/o F here will fall/AKI. Consulted to continue home dose apixaban. Hgb 9.6. Scr 2.35. No dosage adjustment required at this point (pt is 60 y/o and weighs ~100 kg).  Goal of Therapy:  Monitor platelets by anticoagulation protocol: Yes   Plan:  Apixaban 5 mg BID Daily CBC Monitor for bleeding  Narda Bonds, PharmD, BCPS Clinical Pharmacist Phone: [phone]

## 2021-05-04 NOTE — ED Notes (Addendum)
Pt given glass of ice water and two packs of graham crackers

## 2021-05-04 NOTE — Progress Notes (Signed)
Patient arrived to 9M unit room 9M3 at [phone]. Patient is A/O x4, calm and cooperative. Tele monitor applied to patient per order. Purewick suction applied. 20 G IV to left wrist is clean, dry, and intact. Patient denies pain at this time. Patient oriented to unit. Bed in low position and Call button within reach.

## 2021-05-04 NOTE — H&P (Addendum)
History and Physical  DERRY SLAVEN [id number] DOB: [birth year] DOA: 05/03/2021  Referring physician: Joline Maxcy, PA-EDP. PCP: [facility] [place]  Outpatient Specialists: None. Patient coming from: Home.   Chief Complaint: Fall.  HPI: LIZBET NOURY is a 60 y.o. female with medical history significant for morbid obesity with BMI of 40, previous RLE DVT on Eliquis, PAD s/p R transmetatarsal amputation with chronic wound who presented to [facility] ED after mechanical fall at home.  She reports tripping multiple times on offloading shoe which she has been wearing since treatment for chronic right foot wound.  She fell twice on the day of presentation.  She hit her face on the ground during her second fall.  Denies loss of consciousness.  Denies vision changes, weakness, dizziness, lightheadedness, chest pain, or dyspnea.  Reports chills today and RLE tenderness with erythema.  Denies fever.  Patient presented to the ED for further evaluation.  Work-up in the ED revealed non acute CT head, and concern for AKI.  EDP requested admission for AKI on CKD 3B.  ED Course:  Temperature 97.8.  BP 136/59, pulse 61, respiration rate 20, O2 saturation 93% on room air.  Lab studies remarkable for serum sodium 133, potassium 4.4, glucose 208, BUN 25, creatinine 2.80, baseline 1.3.  WBC 15.8, hemoglobin 10.2, MCV 93.5, platelet count 330.  Review of Systems: Review of systems as noted in the HPI. All other systems reviewed and are negative.   Past Medical History:  Diagnosis Date   CHF (congestive heart failure) ([name])    COPD (chronic obstructive pulmonary disease) ([name])    Diabetes mellitus without complication ([place])    DVT (deep venous thrombosis) ([place])    Hypertension    Renal disorder    History reviewed. No pertinent surgical history.  Social History:  has no history on file for tobacco use, alcohol use, and drug use.   Allergies  Allergen Reactions   Gabapentin     Family  history: Mother with history of colon cancer  Prior to Admission medications   Not on File    Physical Exam: BP (!) 136/59   Pulse 61   Temp 97.8 F (36.6 C) (Oral)   Resp 20   Ht '5\' 2"'$  (1.575 m)   Wt 99.8 kg   LMP  (LMP Unknown)   SpO2 93%   BMI 40.24 kg/m   General: 60 y.o. year-old female well developed well nourished in no acute distress.  Alert and oriented x3. Cardiovascular: Regular rate and rhythm with no rubs or gallops.  No thyromegaly or JVD noted.  Trace lower extremity edema le. Respiratory: Clear to auscultation with no wheezes or rales. Good inspiratory effort. Abdomen: Soft nontender nondistended with normal bowel sounds x4 quadrants. Muskuloskeletal: No cyanosis or clubbing.  Trace lower extremity edema bilaterally.  Lower extremity edema is worse on the right. Neuro: CN II-XII intact, strength, sensation, reflexes Skin: Right lower extremity status post transmetatarsal amputation with healing chronic wound at the plantar region.  Right lower extremity mildly edematous, erythematous, warm and tender to palpation. Psychiatry: Judgement and insight appear normal. Mood is appropriate for condition and setting          Labs on Admission:  Basic Metabolic Panel: Recent Labs  Lab 05/03/21 2048 05/03/21 2053  NA 132* 133*  K 4.3 4.4  CL 98 102  CO2 21*  --   GLUCOSE 213* 208*  BUN 50* 45*  CREATININE 2.66* 2.80*  CALCIUM 8.2*  --  Liver Function Tests: Recent Labs  Lab 05/03/21 2048  AST 25  ALT 28  ALKPHOS 176*  BILITOT 0.4  PROT 6.7  ALBUMIN 2.7*   No results for input(s): LIPASE, AMYLASE in the last 168 hours. No results for input(s): AMMONIA in the last 168 hours. CBC: Recent Labs  Lab 05/03/21 2048 05/03/21 2053  WBC 15.8*  --   HGB 10.1* 10.2*  HCT 33.1* 30.0*  MCV 93.5  --   PLT 330  --    Cardiac Enzymes: No results for input(s): CKTOTAL, CKMB, CKMBINDEX, TROPONINI in the last 168 hours.  BNP (last 3 results) No results for  input(s): BNP in the last 8760 hours.  ProBNP (last 3 results) No results for input(s): PROBNP in the last 8760 hours.  CBG: No results for input(s): GLUCAP in the last 168 hours.  Radiological Exams on Admission: DG Elbow Complete Right  Result Date: 05/03/2021 CLINICAL DATA:  Status post fall.  Trauma EXAM: RIGHT ELBOW - COMPLETE 3+ VIEW COMPARISON:  02/09/2021 FINDINGS: No joint effusion. No fracture or dislocation identified. Remote healed radial head fracture. IMPRESSION: 1. No acute findings. 2. Remote healed radial head fracture. Electronically Signed   By: Kerby Moors M.D.   On: 05/03/2021 21:23   CT HEAD WO CONTRAST  Result Date: 05/04/2021 CLINICAL DATA:  Neck trauma. Changes injury mechanism. Status post 2 ground level falls. Hit above right eye. Hematoma. On Eliquis. EXAM: CT HEAD WITHOUT CONTRAST CT CERVICAL SPINE WITHOUT CONTRAST TECHNIQUE: Multidetector CT imaging of the head and cervical spine was performed following the standard protocol without intravenous contrast. Multiplanar CT image reconstructions of the cervical spine were also generated. COMPARISON:  MRI head 01/02/2021, CT head 01/01/2021 FINDINGS: CT HEAD FINDINGS Brain: Cerebral ventricle sizes are concordant with the degree of cerebral volume loss. Patchy and confluent areas of decreased attenuation are noted throughout the deep and periventricular white matter of the cerebral hemispheres bilaterally, compatible with chronic microvascular ischemic disease. No evidence of large-territorial acute infarction. No parenchymal hemorrhage. No mass lesion. No extra-axial collection. Streak artifact along the right temporal lobe. No mass effect or midline shift. No hydrocephalus. Basilar cisterns are patent. Vascular: No hyperdense vessel. Atherosclerotic calcifications are present within the cavernous internal carotid and vertebral arteries. Skull: No acute fracture or focal lesion. Sinuses/Orbits: Paranasal sinuses and mastoid  air cells are clear. The orbits are unremarkable. Other: Right frontal scalp small hematoma formation. CT CERVICAL SPINE FINDINGS Alignment: Normal. Skull base and vertebrae: Multilevel mild-to-moderate degenerative changes of the spine that is most prominent at the C6-C7 level. Slightly limited evaluation of the mid to lower cervical spine due to quantum mottle artifact. No acute fracture. No aggressive appearing focal osseous lesion or focal pathologic process. Soft tissues and spinal canal: No prevertebral fluid or swelling. No visible canal hematoma. Upper chest: Unremarkable. Other: None. IMPRESSION: 1. No acute intracranial abnormality. 2. No acute displaced fracture or traumatic listhesis of the cervical spine. Electronically Signed   By: [name] M.D.   On: 05/04/2021 00:05   CT CERVICAL SPINE WO CONTRAST  Result Date: 05/04/2021 CLINICAL DATA:  Neck trauma. Changes injury mechanism. Status post 2 ground level falls. Hit above right eye. Hematoma. On Eliquis. EXAM: CT HEAD WITHOUT CONTRAST CT CERVICAL SPINE WITHOUT CONTRAST TECHNIQUE: Multidetector CT imaging of the head and cervical spine was performed following the standard protocol without intravenous contrast. Multiplanar CT image reconstructions of the cervical spine were also generated. COMPARISON:  MRI head 01/02/2021, CT head 01/01/2021  FINDINGS: CT HEAD FINDINGS Brain: Cerebral ventricle sizes are concordant with the degree of cerebral volume loss. Patchy and confluent areas of decreased attenuation are noted throughout the deep and periventricular white matter of the cerebral hemispheres bilaterally, compatible with chronic microvascular ischemic disease. No evidence of large-territorial acute infarction. No parenchymal hemorrhage. No mass lesion. No extra-axial collection. Streak artifact along the right temporal lobe. No mass effect or midline shift. No hydrocephalus. Basilar cisterns are patent. Vascular: No hyperdense vessel.  Atherosclerotic calcifications are present within the cavernous internal carotid and vertebral arteries. Skull: No acute fracture or focal lesion. Sinuses/Orbits: Paranasal sinuses and mastoid air cells are clear. The orbits are unremarkable. Other: Right frontal scalp small hematoma formation. CT CERVICAL SPINE FINDINGS Alignment: Normal. Skull base and vertebrae: Multilevel mild-to-moderate degenerative changes of the spine that is most prominent at the C6-C7 level. Slightly limited evaluation of the mid to lower cervical spine due to quantum mottle artifact. No acute fracture. No aggressive appearing focal osseous lesion or focal pathologic process. Soft tissues and spinal canal: No prevertebral fluid or swelling. No visible canal hematoma. Upper chest: Unremarkable. Other: None. IMPRESSION: 1. No acute intracranial abnormality. 2. No acute displaced fracture or traumatic listhesis of the cervical spine. Electronically Signed   By: [name] M.D.   On: 05/04/2021 00:05   DG Chest Port 1 View  Result Date: 05/03/2021 CLINICAL DATA:  Chest pain following fall, initial encounter EXAM: PORTABLE CHEST 1 VIEW COMPARISON:  01/01/2021 FINDINGS: Cardiac shadow is prominent but stable. CardioMEMS device is again identified. Lungs are clear bilaterally. No acute bony abnormality is seen. IMPRESSION: No acute abnormality noted. Electronically Signed   By: Inez Catalina M.D.   On: 05/03/2021 21:02   DG Knee Right Port  Result Date: 05/03/2021 CLINICAL DATA:  Status post fall. EXAM: PORTABLE RIGHT KNEE - 1-2 VIEW COMPARISON:  None. FINDINGS: The bones appear osteopenic. Diffuse soft tissue swelling. No joint effusion identified. Moderate tricompartment joint space narrowing and marginal spur formation compatible with degenerative joint disease. Several loose bodies noted within the posterior joint space. IMPRESSION: 1. No acute findings. 2. Moderate tricompartment degenerative joint disease. 3. Diffuse soft tissue  swelling. Electronically Signed   By: Kerby Moors M.D.   On: 05/03/2021 21:20    EKG: I independently viewed the EKG done and my findings are as followed: Sinus rhythm rate of 61.  Nonspecific ST-T changes.  QTc 468.  Assessment/Plan Present on Admission:  AKI (acute kidney injury) ([place])  Active Problems:   AKI (acute kidney injury) ([place])  AKI on CKD 3B Baseline creatinine appears to be 1.3 with GFR of 44. Presented with creatinine 2.80 with GFR of 20 Avoid nephrotoxic agents, dehydration and hypotension. Monitor urine output Repeat renal panel in the morning.  Right lower extremity early cellulitis, POA Obtain blood cultures x2 peripherally. Start Keflex 500 mg 3 times daily x10 days Care order: Demarcate cellulitic skin with skin marker.  Peripheral artery disease status post right transmetatarsal amputation, post fall Resume home regimen for peripheral artery disease PT OT evaluation Fall precautions  Chronic diastolic CHF Last 2D echo done on 03/08/2021 showed LVEF 50 to 55% with grade 2 diastolic dysfunction. Strict I's and O's and daily weight  Morbid obesity BMI 40 Recommend follow-up outpatient at bariatric weight loss clinic  Hypervolemic hyponatremia Presented with serum sodium 132, hypervolemic on exam Continue IV Lasix  Type 2 diabetes with hyperglycemia Obtain hemoglobin A1c Insulin sliding scale coverage  Anemia of chronic disease in  the setting of CKD. Hemoglobin 10.2 No overt bleeding Continue to monitor H&H  Leukocytosis Presented with WBC 15.8 Suspected right lower extremity early cellulitis. Started on Keflex on 05/04/2021  Tobacco use disorder Nicotine patch as needed Cessation counseling    Patient appears to have another medical record number.  Her medical record has been marked for merging.  Note that medication reconciliation has not been completed at the time of this dictation.   DVT prophylaxis: Eliquis  Code Status: Full  code  Family Communication: None at bedside  Disposition Plan: Admitted to telemetry medical  Consults called: None  Admission status: Observation status.   Status is: Observation    Dispo: The patient is from: Home               Anticipated d/c is to: Home               Patient currently not stable for discharge.    Difficult to place patient, not applicable.        Kayleen Memos MD [facility] Pager [phone]  If 7PM-7AM, please contact night-coverage [email or web address] Password [facility]  05/04/2021, 1:27 AM

## 2021-05-04 NOTE — TOC Benefit Eligibility Note (Signed)
Transition of Care [facility]) Benefit Eligibility Note    Patient Details  Name: [name] MRN: [id number] Date of Birth: 1961/07/13   Medication/Dose: Arne [place]  5 MG BID  Covered?: Yes  Tier: 3 Drug  Prescription Coverage Preferred Pharmacy: [facility]   [place] with Person/Company/Phone Number:: KATHY  @   [employer] #  [phone]  Co-Pay: 25  % OF TOTAL COST  Prior Approval: No  Deductible: Met (OUT-OF-POCKET:UNMET)  Additional Notes: APIXABAN : Crecencio Mc Phone Number: 05/04/2021, 4:46 PM

## 2021-05-04 NOTE — Progress Notes (Signed)
  PROGRESS NOTE    [name]  [id number] DOB: 28-Jul-1961 DOA: 05/03/2021  PCP: [facility] [place]    LOS - 0    Patient admitted after midnight with AKI superimposed on CKD3b and RLE cellulitis.  She presented after 2 falls at home, imaging was negative.  Interval subjective: Pt up in recliner.  Reports having shooting pains down right leg, has hx of sciatica.  Reports her whole right side aches from her fall.  Right leg she states feels hot like sunburn.  Exam: awake, alert, fully oriented, chronically ill-appearing. Obese.  Right periorbial ecchymosis.  RLE erythema (b/l R much more vs L), no erythema beyond outline.  Abd soft non-tender.  BLE edema L>R.   Active Problems:   AKI (acute kidney injury) ([employer])    H&P by Dr. [place] Crane reviewed, and I agree with the assessment and plan.  In addition:  --Renal function improving with diuresis, continue  Add to A&P --  Pressure injury of skin - POA  Pressure Injury 05/04/21 Other (Comment) Right Stage 3 -  Full thickness tissue loss. Subcutaneous fat may be visible but bone, tendon or muscle are NOT exposed. 0.5x0.5, 1 inch depth (Active)  05/04/21 0630  Location: Other (Comment) (posterior foot)  Location Orientation: Right  Staging: Stage 3 -  Full thickness tissue loss. Subcutaneous fat may be visible but bone, tendon or muscle are NOT exposed.  Wound Description (Comments): 0.5x0.5, 1 inch depth  Present on Admission: Yes        No Charge    Ezekiel Slocumb, DO [facility]   If 7PM-7AM, please contact night-coverage [email or web address] 05/04/2021, 7:38 AM

## 2021-05-05 DIAGNOSIS — N1832 Chronic kidney disease, stage 3b: Secondary | ICD-10-CM | POA: Diagnosis present

## 2021-05-05 DIAGNOSIS — W19XXXA Unspecified fall, initial encounter: Secondary | ICD-10-CM

## 2021-05-05 DIAGNOSIS — J449 Chronic obstructive pulmonary disease, unspecified: Secondary | ICD-10-CM

## 2021-05-05 DIAGNOSIS — N179 Acute kidney failure, unspecified: Secondary | ICD-10-CM | POA: Diagnosis not present

## 2021-05-05 DIAGNOSIS — G4733 Obstructive sleep apnea (adult) (pediatric): Secondary | ICD-10-CM | POA: Diagnosis present

## 2021-05-05 DIAGNOSIS — I5043 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) and diastolic (congestive) heart failure: Secondary | ICD-10-CM | POA: Diagnosis not present

## 2021-05-05 DIAGNOSIS — L03115 Cellulitis of right lower limb: Secondary | ICD-10-CM

## 2021-05-05 DIAGNOSIS — I480 Paroxysmal atrial fibrillation: Secondary | ICD-10-CM | POA: Diagnosis present

## 2021-05-05 DIAGNOSIS — N183 Chronic kidney disease, stage 3 unspecified: Secondary | ICD-10-CM | POA: Diagnosis present

## 2021-05-05 DIAGNOSIS — E118 Type 2 diabetes mellitus with unspecified complications: Secondary | ICD-10-CM | POA: Diagnosis present

## 2021-05-05 DIAGNOSIS — L0390 Cellulitis, unspecified: Secondary | ICD-10-CM | POA: Diagnosis present

## 2021-05-05 DIAGNOSIS — I739 Peripheral vascular disease, unspecified: Secondary | ICD-10-CM | POA: Diagnosis present

## 2021-05-05 HISTORY — DX: Unspecified fall, initial encounter: W19.XXXA

## 2021-05-05 LAB — CBC
HCT: 31 % — ABNORMAL LOW (ref 36.0–46.0)
Hemoglobin: 9.6 g/dL — ABNORMAL LOW (ref 12.0–15.0)
MCH: 28.2 pg (ref 26.0–34.0)
MCHC: 31 g/dL (ref 30.0–36.0)
MCV: 90.9 fL (ref 80.0–100.0)
Platelets: 332 10*3/uL (ref 150–400)
RBC: 3.41 MIL/uL — ABNORMAL LOW (ref 3.87–5.11)
RDW: 15.2 % (ref 11.5–15.5)
WBC: 11.8 10*3/uL — ABNORMAL HIGH (ref 4.0–10.5)
nRBC: 0 % (ref 0.0–0.2)

## 2021-05-05 LAB — HEMOGLOBIN A1C
Hgb A1c MFr Bld: 9.7 % — ABNORMAL HIGH (ref 4.8–5.6)
Mean Plasma Glucose: 232 mg/dL

## 2021-05-05 LAB — BASIC METABOLIC PANEL
Anion gap: 12 (ref 5–15)
BUN: 33 mg/dL — ABNORMAL HIGH (ref 6–20)
CO2: 26 mmol/L (ref 22–32)
Calcium: 8.7 mg/dL — ABNORMAL LOW (ref 8.9–10.3)
Chloride: 97 mmol/L — ABNORMAL LOW (ref 98–111)
Creatinine, Ser: 1.6 mg/dL — ABNORMAL HIGH (ref 0.44–1.00)
GFR, Estimated: 37 mL/min — ABNORMAL LOW (ref >60)
Glucose, Bld: 164 mg/dL — ABNORMAL HIGH (ref 70–99)
Potassium: 4 mmol/L (ref 3.5–5.1)
Sodium: 135 mmol/L (ref 135–145)

## 2021-05-05 LAB — GLUCOSE, CAPILLARY
Glucose-Capillary: 176 mg/dL — ABNORMAL HIGH (ref 70–99)
Glucose-Capillary: 313 mg/dL — ABNORMAL HIGH (ref 70–99)

## 2021-05-05 LAB — AMMONIA: Ammonia: 25 umol/L (ref 9–35)

## 2021-05-05 MED ORDER — TRULICITY 0.75 MG/0.5ML ~~LOC~~ SOAJ: 0.7500 mg | SUBCUTANEOUS | 0 refills | Status: DC

## 2021-05-05 MED ORDER — PANTOPRAZOLE SODIUM 40 MG PO TBEC: 40.0000 mg | DELAYED_RELEASE_TABLET | Freq: Every day | ORAL | 0 refills | Status: DC

## 2021-05-05 MED ORDER — INSULIN LISPRO (1 UNIT DIAL) 100 UNIT/ML (KWIKPEN): 8.0000 [IU] | PEN_INJECTOR | Freq: Three times a day (TID) | SUBCUTANEOUS | 11 refills | Status: DC

## 2021-05-05 MED ORDER — HYDRALAZINE HCL 25 MG PO TABS: 25.0000 mg | ORAL_TABLET | Freq: Three times a day (TID) | ORAL | 0 refills | Status: DC

## 2021-05-05 MED ORDER — CEPHALEXIN 500 MG PO CAPS: 500.0000 mg | ORAL_CAPSULE | Freq: Three times a day (TID) | ORAL | 0 refills | Status: AC

## 2021-05-05 MED ORDER — ALBUTEROL SULFATE HFA 108 (90 BASE) MCG/ACT IN AERS: 2.0000 | INHALATION_SPRAY | Freq: Four times a day (QID) | RESPIRATORY_TRACT | 2 refills | Status: DC | PRN

## 2021-05-05 MED ORDER — LEVOTHYROXINE SODIUM 50 MCG PO TABS: 50.0000 ug | ORAL_TABLET | Freq: Every day | ORAL | 0 refills | Status: DC

## 2021-05-05 MED ORDER — NORTRIPTYLINE HCL 50 MG PO CAPS: 50.0000 mg | ORAL_CAPSULE | Freq: Two times a day (BID) | ORAL | 0 refills | Status: DC

## 2021-05-05 MED ORDER — APIXABAN 5 MG PO TABS
5.0000 mg | ORAL_TABLET | Freq: Two times a day (BID) | ORAL | 0 refills | Status: DC
Start: 2021-05-05 — End: 2021-05-05

## 2021-05-05 MED ORDER — FOLIC ACID 1 MG PO TABS: 1.0000 mg | ORAL_TABLET | Freq: Every day | ORAL | 0 refills | Status: DC

## 2021-05-05 MED ORDER — AMLODIPINE BESYLATE 10 MG PO TABS: 10.0000 mg | ORAL_TABLET | Freq: Every day | ORAL | 0 refills | Status: DC

## 2021-05-05 MED ORDER — AMIODARONE HCL 200 MG PO TABS: 100.0000 mg | ORAL_TABLET | Freq: Every day | ORAL | 0 refills | Status: DC

## 2021-05-05 MED ORDER — BUDESONIDE-FORMOTEROL FUMARATE 160-4.5 MCG/ACT IN AERO: 2.0000 | INHALATION_SPRAY | Freq: Two times a day (BID) | RESPIRATORY_TRACT | 12 refills | Status: DC

## 2021-05-05 MED ORDER — INSULIN GLARGINE 100 UNIT/ML ~~LOC~~ SOLN
20.0000 [IU] | Freq: Every day | SUBCUTANEOUS | Status: DC
  Administered 2021-05-05: 20 [IU] via SUBCUTANEOUS
  Filled 2021-05-05: qty 0.2

## 2021-05-05 MED ORDER — INSULIN GLARGINE 100 UNIT/ML SOLOSTAR PEN: 20.0000 [IU] | PEN_INJECTOR | Freq: Every day | SUBCUTANEOUS | 11 refills | Status: DC

## 2021-05-05 MED ORDER — ATORVASTATIN CALCIUM 40 MG PO TABS: 40.0000 mg | ORAL_TABLET | Freq: Every day | ORAL | 0 refills | Status: DC

## 2021-05-05 MED ORDER — BISOPROLOL FUMARATE 5 MG PO TABS
5.0000 mg | ORAL_TABLET | Freq: Every day | ORAL | 0 refills | Status: DC
Start: 2021-05-05 — End: 2021-05-05

## 2021-05-05 MED ORDER — TORSEMIDE 20 MG PO TABS: 80.0000 mg | ORAL_TABLET | Freq: Two times a day (BID) | ORAL | 0 refills | Status: DC

## 2021-05-05 MED ORDER — FLUOXETINE HCL 10 MG PO CAPS: 10.0000 mg | ORAL_CAPSULE | Freq: Every day | ORAL | 0 refills | Status: DC

## 2021-05-05 MED ORDER — EMPAGLIFLOZIN 10 MG PO TABS: 10.0000 mg | ORAL_TABLET | Freq: Every day | ORAL | 0 refills | Status: DC

## 2021-05-05 NOTE — Care Management Obs Status (Signed)
[place] NOTIFICATION   Patient Details  Name: DIANY GARVER MRN: [id number] Date of Birth: [birth year]   Medicare Observation Status Notification Given:  Yes    Carles Collet, RN 05/05/2021, 10:47 AM

## 2021-05-05 NOTE — Progress Notes (Signed)
Inpatient Diabetes Program Recommendations  AACE/ADA: New Consensus Statement on Inpatient Glycemic Control  Target Ranges:  Prepandial:   less than 140 mg/dL      Peak postprandial:   less than 180 mg/dL (1-2 hours)      Critically ill patients:  140 - 180 mg/dL    Results for [name], [name] (MRN [id number]) as of 05/05/2021 11:43  Ref. Range 05/04/2021 07:04 05/04/2021 12:50 05/04/2021 16:46 05/04/2021 20:43 05/05/2021 06:39 05/05/2021 11:33  Glucose-Capillary Latest Ref Range: 70 - 99 mg/dL 196 (H) 227 (H) 181 (H) 237 (H) 176 (H) 313 (H)  Results for [name], [name] (MRN [id number]) as of 05/05/2021 11:43  Ref. Range 05/04/2021 03:12  Hemoglobin A1C Latest Ref Range: 4.8 - 5.6 % 9.7 (H)   Review of Glycemic Control  Diabetes history: DM2 Outpatient Diabetes medications: Lantus 20  units daily, Humalog 8 units TID with meals, Trulicity [id number] mg Qweek (has been out for 1 week), Jardiance 10 mg daily Current orders for Inpatient glycemic control: Novolog 0-9 units TID with meals, Novolog 0-5 units QHS  Inpatient Diabetes Program Recommendations:    Insulin: Please consider ordering Novolog 5 units TID with meals for meal coverage if patient eats at least 50% of meals.  HbgA1C: A1C 9.7% on 05/04/21 indicating an average glucose of 232 mg/dl over the past 2-3 months.  NOTE: Spoke with patient about diabetes and home regimen for diabetes control. Patient reports taking Lantus 20  units daily, Humalog 8 units TID with meals (if glucose if over 123XX123 mg/dl), Trulicity [id number] mg Qweek (has been out for 1 week), Jardiance 10 mg daily as an outpatient for diabetes control. Patient reports taking DM medications consistently but notes she has been out of Trulicity for about 1 week (PCP started her on it 6 weeks ago and has only been taking it for 4 weeks and needs to go back to PCP to get new prescription).  Patient has FreeStyle [place] and reports that glucose ranges from 60-200's mg/dl. Patient states that her  glucose is generally 180's to upper 200's mg/dl and states she has hypoglycemia about once a week. She states that when hypoglycemia occurs it is usually during the day. Discussed A1C results (9.7% on 05/04/21) and explained that current A1C indicates an average glucose of 232 mg/dl over the past 2-3 months. Discussed glucose and A1C goals. Discussed importance of checking CBGs and maintaining good CBG control to prevent long-term and short-term complications. Explained how hyperglycemia leads to damage within blood vessels which lead to the common complications seen with uncontrolled diabetes. Stressed to the patient the importance of improving glycemic control to prevent further complications from uncontrolled diabetes. Discussed impact of nutrition, exercise, stress, sickness, and medications on diabetes control.  Patient is not sure when her next follow up is with PCP. Encouraged patient to call and get follow up visit within one week for follow up regarding DM control and to get new prescription for Trulicity (patient prefers to get Rx for PCP versus discharging provider ordering at discharge).  Patient notes that she recently switched back to [place] and she is going to start getting medications bubble packed so she feels this will also help ensure she is taking medications consistently. Patient verbalized understanding of information discussed and reports no further questions at this time related to diabetes.  Thanks, Barnie Alderman, RN, MSN, CDE Diabetes Coordinator Inpatient Diabetes Program [phone] (Team Pager)

## 2021-05-05 NOTE — Discharge Summary (Signed)
Physician Discharge Summary  [name] [id number] DOB: November 16, 1960 DOA: 05/03/2021  PCP: [facility]  Admit date: 05/03/2021 Discharge date: 05/05/2021  Admitted From: Home Disposition: Home  Recommendations for Outpatient Follow-up:  Follow up with PCP in 1-2 weeks Follow-up with heart failure clinic in the next 1 to 2 weeks  Home Health: Home health PT, OT Equipment/Devices:  Discharge Condition: Stable CODE STATUS: Full code Diet recommendation: Heart healthy, carb modified  Brief/Interim Summary: 60 year old female with a history of diabetes, hypertension, paroxysmal atrial fibrillation, heart failure, admitted to the hospital after having a mechanical fall.  She denies any loss of consciousness.  She had noticed increasing right lower extremity tenderness and erythema.  Labs also indicated acute kidney injury on chronic kidney disease.  She is admitted for further management.  Discharge Diagnoses:  Active Problems:   AKI (acute kidney injury) ([place])   Pressure injury of skin   Fall   PAF (paroxysmal atrial fibrillation) ([name])   COPD (chronic obstructive pulmonary disease) ([name])   DM (diabetes mellitus), type 2 with complications ([name])   PAD (peripheral artery disease) ([name])   Cellulitis   Acute on chronic combined systolic and diastolic CHF (congestive heart failure) ([name])   OSA (obstructive sleep apnea)   CKD (chronic kidney disease) stage 3, GFR 30-59 ml/min ([name])  Acute kidney injury on chronic kidney disease stage IIIb -Presented with a creatinine of 2.8 -Baseline creatinine appears to be 1.3 -Appears to be volume overloaded -She was treated with intravenous Lasix with improvement of creatinine to 1.6 -Continue to hold losartan for now until follow-up with cardiology  Acute on chronic combined CHF -Patient was diuresed with intravenous Lasix -Overall volume status has improved, she is not short of breath -She has been transitioned back to her  home dose of torsemide 80 mg twice daily -We will also continue on spironolactone -Follow-up with heart failure clinic  Right lower extremity cellulitis -Treated with Keflex -Overall erythema appears to be improving -Complete antibiotic course as an outpatient  Anemia of chronic disease, in the setting of chronic kidney disease -Hemoglobin is currently stable, continue to monitor as an outpatient  Fall -CT head/C-spine did not indicate any deep injuries -Seen by PT/OT with recommendations for home health  COPD -No wheezing or shortness of breath -Continue Symbicort and albuterol  PAF -Heart rate is currently stable -She is anticoagulated with Eliquis    Discharge Instructions  Discharge Instructions     Diet - low sodium heart healthy   Complete by: As directed    Discharge wound care:   Complete by: As directed    Continue supportive treatments with daily dressing changes. Change gauze daily   Face-to-face encounter (required for Medicare/Medicaid patients)   Complete by: As directed    I Kathie Dike certify that this patient is under my care and that I, or a nurse practitioner or physician's assistant working with me, had a face-to-face encounter that meets the physician face-to-face encounter requirements with this patient on 05/05/2021. The encounter with the patient was in whole, or in part for the following medical condition(s) which is the primary reason for home health care (List medical condition): chf exacerbation   The encounter with the patient was in whole, or in part, for the following medical condition, which is the primary reason for home health care: chf exacerbation   I certify that, based on my findings, the following services are medically necessary home health services: Physical therapy   Reason for Medically  Necessary Home Health Services: Therapy- Therapeutic Exercises to Increase Strength and Endurance   My clinical findings support the need for the  above services: Unable to leave home safely without assistance and/or assistive device   Further, I certify that my clinical findings support that this patient is homebound due to: Unable to leave home safely without assistance   Home Health   Complete by: As directed    To provide the following care/treatments:  PT OT     Increase activity slowly   Complete by: As directed       Allergies as of 05/05/2021       Reactions   Gabapentin         Medication List     STOP taking these medications    acetaminophen 500 MG tablet Commonly known as: TYLENOL   BC FAST PAIN RELIEF PO   losartan 100 MG tablet Commonly known as: COZAAR       TAKE these medications    albuterol 108 (90 Base) MCG/ACT inhaler Commonly known as: VENTOLIN HFA Inhale 2 puffs into the lungs every 6 (six) hours as needed for wheezing or shortness of breath.   allopurinol 100 MG tablet Commonly known as: ZYLOPRIM Take 100 mg by mouth daily.   amiodarone 200 MG tablet Commonly known as: PACERONE Take 0.5 tablets (100 mg total) by mouth daily. What changed:  medication strength how much to take   amLODipine 10 MG tablet Commonly known as: NORVASC Take 1 tablet (10 mg total) by mouth daily. What changed:  medication strength how much to take   atorvastatin 40 MG tablet Commonly known as: LIPITOR Take 40 mg by mouth daily.   bisoprolol 5 MG tablet Commonly known as: ZEBETA Take 5 mg by mouth daily.   budesonide-formoterol 160-4.5 MCG/ACT inhaler Commonly known as: SYMBICORT Inhale 2 puffs into the lungs 2 (two) times daily.   cephALEXin 500 MG capsule Commonly known as: KEFLEX Take 1 capsule (500 mg total) by mouth every 8 (eight) hours for 7 days.   colchicine 0.6 MG tablet Take 0.6 mg by mouth daily.   cyclobenzaprine 10 MG tablet Commonly known as: FLEXERIL Take 1 tablet by mouth 3 times/day as needed-between meals & bedtime for muscle spasms.   Eliquis 5 MG Tabs  tablet Generic drug: apixaban Take 5 mg by mouth 2 (two) times daily.   empagliflozin 10 MG Tabs tablet Commonly known as: JARDIANCE Take 10 mg by mouth daily.   FLUoxetine 10 MG capsule Commonly known as: PROZAC Take 1 capsule (10 mg total) by mouth at bedtime. What changed: when to take this   folic acid 1 MG tablet Commonly known as: FOLVITE Take 1 mg by mouth daily.   HumaLOG KwikPen 200 UNIT/ML KwikPen Generic drug: insulin lispro Inject 8 Units into the skin in the morning, at noon, and at bedtime.   hydrALAZINE 25 MG tablet Commonly known as: APRESOLINE Take 1 tablet (25 mg total) by mouth 3 (three) times daily. What changed:  medication strength how much to take when to take this   Lantus SoloStar 100 UNIT/ML Solostar Pen Generic drug: insulin glargine Inject 20 Units into the skin at bedtime.   levothyroxine 50 MCG tablet Commonly known as: SYNTHROID Take 50 mcg by mouth daily before breakfast.   nortriptyline 50 MG capsule Commonly known as: PAMELOR Take 50 mg by mouth 2 (two) times daily.   pantoprazole 40 MG tablet Commonly known as: PROTONIX Take 40 mg by mouth daily.  spironolactone 25 MG tablet Commonly known as: ALDACTONE Take 1 tablet by mouth daily.   torsemide 20 MG tablet Commonly known as: Demadex Take 4 tablets (80 mg total) by mouth 2 (two) times daily. What changed:  how much to take when to take this additional instructions   Trulicity 1.5 0000000 Sopn Generic drug: Dulaglutide Inject 1.5 mg into the skin once a week. Every Friday   VITAMIN C PO Take 1 tablet by mouth daily.   VITAMIN D PO Take 1 tablet by mouth daily.   VITAMIN E PO Take 1 tablet by mouth daily.               Discharge Care Instructions  (From admission, onward)           Start     Ordered   05/05/21 0000  Discharge wound care:       Comments: Continue supportive treatments with daily dressing changes. Change gauze daily   05/05/21  1235            Follow-up Information     [facility].   Specialty: [place] Why: [facility]) They will call you in 1-2 days to set up your first home visit. Contact information: [place] [phone]                Allergies  Allergen Reactions   Gabapentin     Consultations:    Procedures/Studies: DG Elbow Complete Right  Result Date: 05/03/2021 CLINICAL DATA:  Status post fall.  Trauma EXAM: RIGHT ELBOW - COMPLETE 3+ VIEW COMPARISON:  02/09/2021 FINDINGS: No joint effusion. No fracture or dislocation identified. Remote healed radial head fracture. IMPRESSION: 1. No acute findings. 2. Remote healed radial head fracture. Electronically Signed   By: Kerby Moors M.D.   On: 05/03/2021 21:23   CT HEAD WO CONTRAST  Result Date: 05/04/2021 CLINICAL DATA:  Neck trauma. Changes injury mechanism. Status post 2 ground level falls. Hit above right eye. Hematoma. On Eliquis. EXAM: CT HEAD WITHOUT CONTRAST CT CERVICAL SPINE WITHOUT CONTRAST TECHNIQUE: Multidetector CT imaging of the head and cervical spine was performed following the standard protocol without intravenous contrast. Multiplanar CT image reconstructions of the cervical spine were also generated. COMPARISON:  MRI head 01/02/2021, CT head 01/01/2021 FINDINGS: CT HEAD FINDINGS Brain: Cerebral ventricle sizes are concordant with the degree of cerebral volume loss. Patchy and confluent areas of decreased attenuation are noted throughout the deep and periventricular white matter of the cerebral hemispheres bilaterally, compatible with chronic microvascular ischemic disease. No evidence of large-territorial acute infarction. No parenchymal hemorrhage. No mass lesion. No extra-axial collection. Streak artifact along the right temporal lobe. No mass effect or midline shift. No hydrocephalus. Basilar cisterns are patent. Vascular: No hyperdense vessel.  Atherosclerotic calcifications are present within the cavernous internal carotid and vertebral arteries. Skull: No acute fracture or focal lesion. Sinuses/Orbits: Paranasal sinuses and mastoid air cells are clear. The orbits are unremarkable. Other: Right frontal scalp small hematoma formation. CT CERVICAL SPINE FINDINGS Alignment: Normal. Skull base and vertebrae: Multilevel mild-to-moderate degenerative changes of the spine that is most prominent at the C6-C7 level. Slightly limited evaluation of the mid to lower cervical spine due to quantum mottle artifact. No acute fracture. No aggressive appearing focal osseous lesion or focal pathologic process. Soft tissues and spinal canal: No prevertebral fluid or swelling. No visible canal hematoma. Upper chest: Unremarkable. Other: None. IMPRESSION: 1. No acute intracranial abnormality. 2.  No acute displaced fracture or traumatic listhesis of the cervical spine. Electronically Signed   By: [name] M.D.   On: 05/04/2021 00:05   CT CERVICAL SPINE WO CONTRAST  Result Date: 05/04/2021 CLINICAL DATA:  Neck trauma. Changes injury mechanism. Status post 2 ground level falls. Hit above right eye. Hematoma. On Eliquis. EXAM: CT HEAD WITHOUT CONTRAST CT CERVICAL SPINE WITHOUT CONTRAST TECHNIQUE: Multidetector CT imaging of the head and cervical spine was performed following the standard protocol without intravenous contrast. Multiplanar CT image reconstructions of the cervical spine were also generated. COMPARISON:  MRI head 01/02/2021, CT head 01/01/2021 FINDINGS: CT HEAD FINDINGS Brain: Cerebral ventricle sizes are concordant with the degree of cerebral volume loss. Patchy and confluent areas of decreased attenuation are noted throughout the deep and periventricular white matter of the cerebral hemispheres bilaterally, compatible with chronic microvascular ischemic disease. No evidence of large-territorial acute infarction. No parenchymal hemorrhage. No mass lesion.  No extra-axial collection. Streak artifact along the right temporal lobe. No mass effect or midline shift. No hydrocephalus. Basilar cisterns are patent. Vascular: No hyperdense vessel. Atherosclerotic calcifications are present within the cavernous internal carotid and vertebral arteries. Skull: No acute fracture or focal lesion. Sinuses/Orbits: Paranasal sinuses and mastoid air cells are clear. The orbits are unremarkable. Other: Right frontal scalp small hematoma formation. CT CERVICAL SPINE FINDINGS Alignment: Normal. Skull base and vertebrae: Multilevel mild-to-moderate degenerative changes of the spine that is most prominent at the C6-C7 level. Slightly limited evaluation of the mid to lower cervical spine due to quantum mottle artifact. No acute fracture. No aggressive appearing focal osseous lesion or focal pathologic process. Soft tissues and spinal canal: No prevertebral fluid or swelling. No visible canal hematoma. Upper chest: Unremarkable. Other: None. IMPRESSION: 1. No acute intracranial abnormality. 2. No acute displaced fracture or traumatic listhesis of the cervical spine. Electronically Signed   By: [name] M.D.   On: 05/04/2021 00:05   DG Chest Port 1 View  Result Date: 05/03/2021 CLINICAL DATA:  Chest pain following fall, initial encounter EXAM: PORTABLE CHEST 1 VIEW COMPARISON:  01/01/2021 FINDINGS: Cardiac shadow is prominent but stable. CardioMEMS device is again identified. Lungs are clear bilaterally. No acute bony abnormality is seen. IMPRESSION: No acute abnormality noted. Electronically Signed   By: Inez Catalina M.D.   On: 05/03/2021 21:02   DG Knee Right Port  Result Date: 05/03/2021 CLINICAL DATA:  Status post fall. EXAM: PORTABLE RIGHT KNEE - 1-2 VIEW COMPARISON:  None. FINDINGS: The bones appear osteopenic. Diffuse soft tissue swelling. No joint effusion identified. Moderate tricompartment joint space narrowing and marginal spur formation compatible with degenerative  joint disease. Several loose bodies noted within the posterior joint space. IMPRESSION: 1. No acute findings. 2. Moderate tricompartment degenerative joint disease. 3. Diffuse soft tissue swelling. Electronically Signed   By: Kerby Moors M.D.   On: 05/03/2021 21:20      Subjective: No shortness of breath, she is feeling better.  Feels that the swelling in her lower extremities is better  Discharge Exam: Vitals:   05/04/21 2043 05/04/21 2300 05/05/21 0501 05/05/21 0900  BP: (!) 152/65  135/66 130/64  Pulse: 68  89 86  Resp: '18  20 18  '$ Temp: 98.2 F (36.8 C)  99.1 F (37.3 C) 98 F (36.7 C)  TempSrc: Oral  Oral Oral  SpO2: 98%  94% 94%  Weight:  101.8 kg    Height:        General: Pt is alert, awake, not  in acute distress Cardiovascular: RRR, S1/S2 +, no rubs, no gallops Respiratory: CTA bilaterally, no wheezing, no rhonchi Abdominal: Soft, NT, ND, bowel sounds + Extremities: Dark erythema noted in bilateral lower extremities right transmetatarsal amputation    The results of significant diagnostics from this hospitalization (including imaging, microbiology, ancillary and laboratory) are listed below for reference.     Microbiology: Recent Results (from the past 240 hour(s))  Resp Panel by RT-PCR (Flu A&B, Covid) Nasopharyngeal Swab     Status: None   Collection Time: 05/04/21  2:08 AM   Specimen: Nasopharyngeal Swab; Nasopharyngeal(NP) swabs in vial transport medium  Result Value Ref Range Status   SARS Coronavirus 2 by RT PCR NEGATIVE NEGATIVE Final    Comment: (NOTE) SARS-CoV-2 target nucleic acids are NOT DETECTED.  The SARS-CoV-2 RNA is generally detectable in upper respiratory specimens during the acute phase of infection. The lowest concentration of SARS-CoV-2 viral copies this assay can detect is 138 copies/mL. A negative result does not preclude SARS-Cov-2 infection and should not be used as the sole basis for treatment or other patient management decisions.  A negative result may occur with  improper specimen collection/handling, submission of specimen other than nasopharyngeal swab, presence of viral mutation(s) within the areas targeted by this assay, and inadequate number of viral copies(<138 copies/mL). A negative result must be combined with clinical observations, patient history, and epidemiological information. The expected result is Negative.  Fact Sheet for Patients:  EntrepreneurPulse.com.au  Fact Sheet for Healthcare Providers:  IncredibleEmployment.be  This test is no t yet approved or cleared by the [place] FDA and  has been authorized for detection and/or diagnosis of SARS-CoV-2 by FDA under an Emergency Use Authorization (EUA). This EUA will remain  in effect (meaning this test can be used) for the duration of the COVID-19 declaration under Section 564(b)(1) of the Act, 21 U.S.C.section 360bbb-3(b)(1), unless the authorization is terminated  or revoked sooner.       Influenza A by PCR NEGATIVE NEGATIVE Final   Influenza B by PCR NEGATIVE NEGATIVE Final    Comment: (NOTE) The Xpert Xpress SARS-CoV-2/FLU/RSV plus assay is intended as an aid in the diagnosis of influenza from Nasopharyngeal swab specimens and should not be used as a sole basis for treatment. Nasal washings and aspirates are unacceptable for Xpert Xpress SARS-CoV-2/FLU/RSV testing.  Fact Sheet for Patients: EntrepreneurPulse.com.au  Fact Sheet for Healthcare Providers: IncredibleEmployment.be  This test is not yet approved or cleared by the [place] FDA and has been authorized for detection and/or diagnosis of SARS-CoV-2 by FDA under an Emergency Use Authorization (EUA). This EUA will remain in effect (meaning this test can be used) for the duration of the COVID-19 declaration under Section 564(b)(1) of the Act, 21 U.S.C. section 360bbb-3(b)(1), unless the authorization  is terminated or revoked.  Performed at [facility], [place] [address] [place], [place] [address]   Culture, blood (routine x 2)     Status: None (Preliminary result)   Collection Time: 05/04/21  3:12 AM   Specimen: BLOOD RIGHT HAND  Result Value Ref Range Status   Specimen Description BLOOD RIGHT HAND  Final   Special Requests   Final    BOTTLES DRAWN AEROBIC AND ANAEROBIC Blood Culture adequate volume   Culture   Final    NO GROWTH 1 DAY Performed at [name], [place] [address], [place], [place] [address]    Report Status PENDING  Incomplete  Culture, blood (routine x 2)  Status: None (Preliminary result)   Collection Time: 05/04/21  7:06 AM   Specimen: BLOOD RIGHT HAND  Result Value Ref Range Status   Specimen Description BLOOD RIGHT HAND  Final   Special Requests   Final    BOTTLES DRAWN AEROBIC AND ANAEROBIC Blood Culture adequate volume   Culture   Final    NO GROWTH < 24 HOURS Performed at [facility], [address] [place], [place] [address]    Report Status PENDING  Incomplete     Labs: BNP (last 3 results) Recent Labs    05/04/21 0053  BNP [id number]*   Basic Metabolic Panel: Recent Labs  Lab 05/03/21 2048 05/03/21 2053 05/04/21 0125 05/04/21 0312 05/05/21 0437  NA 132* 133*  --  137 135  K 4.3 4.4  --  4.2 4.0  CL 98 102  --  102 97*  CO2 21*  --   --  24 26  GLUCOSE 213* 208*  --  101* 164*  BUN 50* 45*  --  47* 33*  CREATININE 2.66* 2.80* 2.46* 2.35* 1.60*  CALCIUM 8.2*  --   --  8.5* 8.7*  MG  --   --   --  2.6*  --   PHOS  --   --   --  5.6*  --    Liver Function Tests: Recent Labs  Lab 05/03/21 2048  AST 25  ALT 28  ALKPHOS 176*  BILITOT 0.4  PROT 6.7  ALBUMIN 2.7*   No results for input(s): LIPASE, AMYLASE in the last 168 hours. Recent Labs  Lab 05/05/21 1021  AMMONIA 25   CBC: Recent Labs  Lab 05/03/21 2048 05/03/21 2053 05/04/21 0125 05/04/21 0312 05/05/21 0437  WBC 15.8*  --  12.6* 11.9* 11.8*   HGB 10.1* 10.2* 9.5* 9.6* 9.6*  HCT 33.1* 30.0* 32.0* 32.6* 31.0*  MCV 93.5  --  93.0 94.2 90.9  PLT 330  --  325 353 332   Cardiac Enzymes: No results for input(s): CKTOTAL, CKMB, CKMBINDEX, TROPONINI in the last 168 hours. BNP: Invalid input(s): POCBNP CBG: Recent Labs  Lab 05/04/21 1250 05/04/21 1646 05/04/21 2043 05/05/21 0639 05/05/21 1133  GLUCAP 227* 181* 237* 176* 313*   D-Dimer No results for input(s): DDIMER in the last 72 hours. Hgb A1c Recent Labs    05/04/21 0312  HGBA1C 9.7*   Lipid Profile No results for input(s): CHOL, HDL, LDLCALC, TRIG, CHOLHDL, LDLDIRECT in the last 72 hours. Thyroid function studies No results for input(s): TSH, T4TOTAL, T3FREE, THYROIDAB in the last 72 hours.  Invalid input(s): FREET3 Anemia work up No results for input(s): VITAMINB12, FOLATE, FERRITIN, TIBC, IRON, RETICCTPCT in the last 72 hours. Urinalysis No results found for: COLORURINE, APPEARANCEUR, [place], [place], GLUCOSEU, [place], [place], [place], PROTEINUR, UROBILINOGEN, NITRITE, LEUKOCYTESUR Sepsis Labs Invalid input(s): PROCALCITONIN,  WBC,  LACTICIDVEN Microbiology Recent Results (from the past 240 hour(s))  Resp Panel by RT-PCR (Flu A&B, Covid) Nasopharyngeal Swab     Status: None   Collection Time: 05/04/21  2:08 AM   Specimen: Nasopharyngeal Swab; Nasopharyngeal(NP) swabs in vial transport medium  Result Value Ref Range Status   SARS Coronavirus 2 by RT PCR NEGATIVE NEGATIVE Final    Comment: (NOTE) SARS-CoV-2 target nucleic acids are NOT DETECTED.  The SARS-CoV-2 RNA is generally detectable in upper respiratory specimens during the acute phase of infection. The lowest concentration of SARS-CoV-2 viral copies this assay can detect is 138 copies/mL. A negative result does not preclude SARS-Cov-2  infection and should not be used as the sole basis for treatment or other patient management decisions. A negative result may occur with  improper specimen  collection/handling, submission of specimen other than nasopharyngeal swab, presence of viral mutation(s) within the areas targeted by this assay, and inadequate number of viral copies(<138 copies/mL). A negative result must be combined with clinical observations, patient history, and epidemiological information. The expected result is Negative.  Fact Sheet for Patients:  EntrepreneurPulse.com.au  Fact Sheet for Healthcare Providers:  IncredibleEmployment.be  This test is no t yet approved or cleared by the [place] FDA and  has been authorized for detection and/or diagnosis of SARS-CoV-2 by FDA under an Emergency Use Authorization (EUA). This EUA will remain  in effect (meaning this test can be used) for the duration of the COVID-19 declaration under Section 564(b)(1) of the Act, 21 U.S.C.section 360bbb-3(b)(1), unless the authorization is terminated  or revoked sooner.       Influenza A by PCR NEGATIVE NEGATIVE Final   Influenza B by PCR NEGATIVE NEGATIVE Final    Comment: (NOTE) The Xpert Xpress SARS-CoV-2/FLU/RSV plus assay is intended as an aid in the diagnosis of influenza from Nasopharyngeal swab specimens and should not be used as a sole basis for treatment. Nasal washings and aspirates are unacceptable for Xpert Xpress SARS-CoV-2/FLU/RSV testing.  Fact Sheet for Patients: EntrepreneurPulse.com.au  Fact Sheet for Healthcare Providers: IncredibleEmployment.be  This test is not yet approved or cleared by the [place] FDA and has been authorized for detection and/or diagnosis of SARS-CoV-2 by FDA under an Emergency Use Authorization (EUA). This EUA will remain in effect (meaning this test can be used) for the duration of the COVID-19 declaration under Section 564(b)(1) of the Act, 21 U.S.C. section 360bbb-3(b)(1), unless the authorization is terminated or revoked.  Performed at [facility], [place] [address] [place], [place] [address]   Culture, blood (routine x 2)     Status: None (Preliminary result)   Collection Time: 05/04/21  3:12 AM   Specimen: BLOOD RIGHT HAND  Result Value Ref Range Status   Specimen Description BLOOD RIGHT HAND  Final   Special Requests   Final    BOTTLES DRAWN AEROBIC AND ANAEROBIC Blood Culture adequate volume   Culture   Final    NO GROWTH 1 DAY Performed at [facility], [place] [address] [place], [place] [address]    Report Status PENDING  Incomplete  Culture, blood (routine x 2)     Status: None (Preliminary result)   Collection Time: 05/04/21  7:06 AM   Specimen: BLOOD RIGHT HAND  Result Value Ref Range Status   Specimen Description BLOOD RIGHT HAND  Final   Special Requests   Final    BOTTLES DRAWN AEROBIC AND ANAEROBIC Blood Culture adequate volume   Culture   Final    NO GROWTH < 24 HOURS Performed at [facility], [place] [address] [place], [place] [address]    Report Status PENDING  Incomplete     Time coordinating discharge: 63mns  SIGNED:   JKathie Dike MD  [facility] 05/05/2021, 12:35 PM   If 7PM-7AM, please contact night-coverage [email or web address]

## 2021-05-05 NOTE — TOC Transition Note (Signed)
Transition of Care [facility]) - CM/SW Discharge Note   Patient Details  Name: IYLAH GHUMAN MRN: [id number] Date of Birth: Nov 03, 1961  Transition of Care [facility]) CM/SW Contact:  Carles Collet, RN Phone Number: 05/05/2021, 10:50 AM   Clinical Narrative:    Notified HH that patient will be leaving today. Spoke w patient at bedside and provided with Eliquis 30 day card.     Final next level of care: [place] Barriers to Discharge: Continued Medical Work up   Patient Goals and CMS Choice Patient states their goals for this hospitalization and ongoing recovery are:: to go home CMS Medicare.gov Compare Post Acute Care list provided to:: Patient Choice offered to / list presented to : Patient  Discharge Placement                       Discharge Plan and Services   Discharge Planning Services: CM Consult Post Acute Care Choice: Home Health          DME Arranged: N/A         HH Arranged: PT, OT HH Agency: [facility] Date [place]: 05/05/21 Time [place]: [id number] Representative spoke with at [place]: [name]  Social Determinants of Health ([place]) Interventions     Readmission Risk Interventions No flowsheet data found.

## 2021-05-05 NOTE — Plan of Care (Signed)
  Problem: Education: Goal: Knowledge of disease and its progression will improve Outcome: Progressing   Problem: Health Behavior/Discharge Planning: Goal: Ability to manage health-related needs will improve Outcome: Progressing   Problem: Clinical Measurements: Goal: Complications related to the disease process or treatment will be avoided or minimized Outcome: Progressing

## 2021-05-06 ENCOUNTER — Encounter (HOSPITAL_COMMUNITY): Payer: Self-pay

## 2021-05-06 ENCOUNTER — Other Ambulatory Visit (HOSPITAL_COMMUNITY): Payer: Self-pay

## 2021-05-06 NOTE — Progress Notes (Signed)
Paramedicine Encounter    Patient ID: [name], female    DOB: 1961/06/19, 60 y.o.   MRN: [id number]   Patient Care Team: [facility] [place] as PCP - General Gwenlyn Found, [name], MD as PCP - Cardiology (Cardiology) [name], MD as PCP - Electrophysiology (Cardiology) [name], MD as Consulting Physician (Cardiology) [name], MD as Consulting Physician (Pulmonary Disease) System, Provider Not In  Patient Active Problem List   Diagnosis Date Noted   Fall 05/05/2021   PAF (paroxysmal atrial fibrillation) ([place]) 05/05/2021   COPD (chronic obstructive pulmonary disease) ([place]) 05/05/2021   DM (diabetes mellitus), type 2 with complications ([place]) 99991111   PAD (peripheral artery disease) ([place]) 05/05/2021   Cellulitis 05/05/2021   Acute on chronic combined systolic and diastolic CHF (congestive heart failure) ([place]) 05/05/2021   OSA (obstructive sleep apnea) 05/05/2021   CKD (chronic kidney disease) stage 3, GFR 30-59 ml/min ([place]) 05/05/2021   AKI (acute kidney injury) ([place]) 05/04/2021   Pressure injury of skin 05/04/2021   Ulcer of foot, unspecified laterality, limited to breakdown of skin ([place]) 02/17/2021   Altered mental status 01/02/2021   AF (paroxysmal atrial fibrillation) ([place]) 01/02/2021   CAD (coronary artery disease) 01/02/2021   PVD (peripheral vascular disease) ([place]) 01/02/2021   Acute renal failure superimposed on stage 4 chronic kidney disease ([place]) 01/02/2021   Diabetes mellitus type 2, uncontrolled, with complications ([place]) [id number]   Diabetic nephropathy ([place]) 01/02/2021   Low back pain 06/01/2020   Hyperlipidemia 04/03/2020   Muscle weakness 03/20/2020   Closed fracture of neck of right radius 03/13/2020   Pain in elbow 03/12/2020   PAD (peripheral artery disease) ([place]) 12/26/2019   Acute renal failure ([place])    Acute respiratory failure with hypoxia ([place]) 12/02/2019   Acute exacerbation of CHF (congestive heart failure) ([place])  12/01/2019   Cellulitis in diabetic foot ([place]) 07/08/2019   Osteomyelitis ([place]) 06/21/2019   NICM (nonischemic cardiomyopathy) ([place]) 06/20/2019   Non-healing ulcer ([place]) 06/20/2019   Acute CHF (congestive heart failure) ([place]) 11/26/2018   CHF (congestive heart failure) ([place]) 11/26/2018   Acute respiratory failure ([place]) 10/21/2018   AKI (acute kidney injury) ([place]) 08/18/2018   Coagulation disorder ([place]) 08/09/2017   Depression 07/21/2017   At risk for adverse drug reaction 06/20/2017   Peripheral neuropathy 06/20/2017   Acute osteomyelitis of right foot ([place]) 06/13/2017   S/P transmetatarsal amputation of foot, right (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 ([place]) 05/19/2017   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[place])    SIRS (systemic inflammatory response syndrome) ([place]) 04/06/2017   CKD (chronic kidney disease), stage III ([place]) 11/24/2016   Suspected sleep apnea 11/24/2016   Ulcer of toe of right foot, with necrosis of bone ([place]) 10/27/2016   Ulcer of left lower leg ([place]) 05/19/2016   Severe obesity (BMI >= 40) ([place]) 02/24/2016   COPD GOLD 0  02/24/2016   Morbid obesity ([place]) 02/24/2016   Encounter for therapeutic drug monitoring 02/10/2016   Symptomatic bradycardia 01/12/2016   Essential hypertension 12/22/2015   Chronic combined systolic and diastolic CHF (congestive heart failure) ([name])    [place]    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 11/08/2015   Tobacco abuse 10/23/2015   Coronary artery disease    DOE (dyspnea on exertion) 04/29/2015   Diabetes mellitus type 2, uncontrolled ([place]) 02/08/2015   Influenza A 02/07/2015   Wrist fracture, left, with routine healing, subsequent encounter 02/05/2015   Wrist  fracture, left, closed, initial encounter 01/29/2015   PAF (paroxysmal atrial fibrillation) ([place]) 01/16/2015   Carotid arterial disease ([place]) 01/16/2015   Claudication ([place]) 01/15/2015   Demand ischemia ([place]) 10/29/2014    Insomnia 02/03/2014   COPD with acute exacerbation ([place]) 02/01/2014   S/P peripheral artery angioplasty - TurboHawk atherectomy; R SFA 09/11/2013    Class: Acute   Leg pain, bilateral 08/19/2013   Hypothyroidism 07/31/2013   Cellulitis 06/13/2013   History of cocaine abuse ([place]) 06/13/2013   Long term current use of anticoagulant therapy 05/20/2013   Alcohol abuse    Narcotic abuse ([place])    Marijuana abuse    Alcoholic cirrhosis ([name])    DM (diabetes mellitus), type 2 with peripheral vascular complications ([name])     Current Outpatient Medications:    ACCU-CHEK GUIDE test strip, USE TO CHECK BLOOD SUGAR FOUR TIMES DAILY, Disp: , Rfl:    albuterol (VENTOLIN HFA) 108 (90 Base) MCG/ACT inhaler, Inhale 2 puffs into the lungs every 6 (six) hours as needed for wheezing or shortness of breath., Disp: 18 g, Rfl: 0   allopurinol (ZYLOPRIM) 100 MG tablet, Take 100 mg by mouth daily., Disp: , Rfl:    amiodarone (PACERONE) 200 MG tablet, Take 0.5 tablets (100 mg total) by mouth daily., Disp: 45 tablet, Rfl: 3   amLODipine (NORVASC) 10 MG tablet, Take 1 tablet (10 mg total) by mouth daily., Disp: 90 tablet, Rfl: 3   amLODipine (NORVASC) 10 MG tablet, Take 1 tablet (10 mg total) by mouth daily., Disp: 30 tablet, Rfl: 0   apixaban (ELIQUIS) 5 MG TABS tablet, TAKE 1 TABLET(5 MG) BY MOUTH TWICE DAILY, Disp: 180 tablet, Rfl: 3   Ascorbic Acid (VITAMIN C PO), Take 1 tablet by mouth daily., Disp: , Rfl:    atorvastatin (LIPITOR) 40 MG tablet, Take 40 mg by mouth every evening., Disp: , Rfl:    bisoprolol (ZEBETA) 5 MG tablet, Take 1 tablet (5 mg total) by mouth daily., Disp: 90 tablet, Rfl: 3   budesonide-formoterol (SYMBICORT) 160-4.5 MCG/ACT inhaler, Inhale 2 puffs into the lungs 2 (two) times daily., Disp: 1 Inhaler, Rfl: 2   cephALEXin (KEFLEX) 500 MG capsule, Take 1 capsule (500 mg total) by mouth every 8 (eight) hours for 7 days., Disp: 21 capsule, Rfl: 0   colchicine 0.6 MG tablet, Take 0.6 mg by  mouth daily., Disp: , Rfl:    Dulaglutide (TRULICITY [place]), Inject A999333 mg into the skin once a week., Disp: , Rfl:    empagliflozin (JARDIANCE) 10 MG TABS tablet, Take 1 tablet (10 mg total) by mouth daily before breakfast., Disp: 90 tablet, Rfl: 3   FLUoxetine (PROZAC) 10 MG capsule, Take 1 capsule (10 mg total) by mouth at bedtime., Disp: 30 capsule, Rfl: 0   folic acid (FOLVITE) 1 MG tablet, Take 1 tablet (1 mg total) by mouth daily., Disp: 30 tablet, Rfl: 0   HUMALOG KWIKPEN 200 UNIT/ML KwikPen, Inject 8 Units into the skin in the morning, at noon, and at bedtime., Disp: , Rfl:    hydrALAZINE (APRESOLINE) 25 MG tablet, Take 1 tablet (25 mg total) by mouth 3 (three) times daily., Disp: 90 tablet, Rfl: 11   insulin aspart (NOVOLOG) 100 UNIT/ML FlexPen, INJECT 8 UNITS INTO THE SKIN THREE TIMES DAILY WITH MEALS., Disp: 15 mL, Rfl: 0   insulin glargine (LANTUS SOLOSTAR) 100 UNIT/ML Solostar Pen, Inject 20 Units into the skin at bedtime., Disp: , Rfl:    insulin lispro (HUMALOG) 100 UNIT/ML KwikPen, Inject  8 Units into the skin 3 (three) times daily with meals., Disp: 15 mL, Rfl: 0   Insulin Pen Needle 32G X 4 MM MISC, USE AS DIRECTED WITH INSULIN PENS, Disp: 100 each, Rfl: 0   levothyroxine (SYNTHROID) 50 MCG tablet, Take 50 mcg by mouth daily before breakfast., Disp: , Rfl:    Multiple Vitamins-Minerals (VITAMIN D3 COMPLETE PO), Take 1 capsule by mouth daily in the afternoon., Disp: , Rfl:    nortriptyline (PAMELOR) 50 MG capsule, Take 50 mg by mouth 2 (two) times daily., Disp: , Rfl:    omega-3 fish oil (MAXEPA) 1000 MG CAPS capsule, Take 1 capsule by mouth daily., Disp: , Rfl:    pantoprazole (PROTONIX) 40 MG tablet, Take 1 tablet (40 mg total) by mouth daily., Disp:  , Rfl:    spironolactone (ALDACTONE) 25 MG tablet, Take 1 tablet by mouth daily., Disp: , Rfl:    torsemide (DEMADEX) 20 MG tablet, Take 4 tablets (80 mg total) by mouth every morning AND 3 tablets (60 mg total) every evening.  (Patient taking differently: Take 4 tablets (80 mg total) by mouth every morning AND 4 tablets (80 mg total) every evening.), Disp: 210 tablet, Rfl: 11   TRULICITY 1.5 0000000 SOPN, Inject 1.5 mg into the skin once a week. Every Friday, Disp: , Rfl:    VITAMIN D PO, Take 1 tablet by mouth daily., Disp: , Rfl:    VITAMIN E PO, Take 1 tablet by mouth daily., Disp: , Rfl:    albuterol (VENTOLIN HFA) 108 (90 Base) MCG/ACT inhaler, Inhale 2 puffs into the lungs every 6 (six) hours as needed for wheezing or shortness of breath. (Patient not taking: Reported on 05/06/2021), Disp: , Rfl:    amiodarone (PACERONE) 200 MG tablet, Take 0.5 tablets (100 mg total) by mouth daily. (Patient not taking: Reported on 05/06/2021), Disp: 30 tablet, Rfl: 0   apixaban (ELIQUIS) 5 MG TABS tablet, Take 5 mg by mouth 2 (two) times daily. (Patient not taking: Reported on 05/06/2021), Disp: , Rfl:    atorvastatin (LIPITOR) 40 MG tablet, Take 40 mg by mouth daily. (Patient not taking: Reported on 05/06/2021), Disp: , Rfl:    bisoprolol (ZEBETA) 5 MG tablet, Take 5 mg by mouth daily. (Patient not taking: Reported on 05/06/2021), Disp: , Rfl:    budesonide-formoterol (SYMBICORT) 160-4.5 MCG/ACT inhaler, Inhale 2 puffs into the lungs 2 (two) times daily. (Patient not taking: Reported on 05/06/2021), Disp: , Rfl:    colchicine 0.6 MG tablet, Take 0.6 mg by mouth daily. (Patient not taking: Reported on 05/06/2021), Disp: , Rfl:    cyclobenzaprine (FLEXERIL) 10 MG tablet, Take 1 tablet by mouth at bedtime. (Patient not taking: No sig reported), Disp: , Rfl:    cyclobenzaprine (FLEXERIL) 10 MG tablet, Take 1 tablet by mouth 3 times/day as needed-between meals & bedtime for muscle spasms. (Patient not taking: Reported on 05/06/2021), Disp: , Rfl:    empagliflozin (JARDIANCE) 10 MG TABS tablet, Take 10 mg by mouth daily. (Patient not taking: Reported on 05/06/2021), Disp: , Rfl:    FLUoxetine (PROZAC) 10 MG tablet, Take 10 mg by mouth at bedtime.  (Patient not taking: Reported on 05/06/2021), Disp: , Rfl:    folic acid (FOLVITE) 1 MG tablet, Take 1 mg by mouth daily. (Patient not taking: Reported on 05/06/2021), Disp: , Rfl:    hydrALAZINE (APRESOLINE) 25 MG tablet, Take 1 tablet (25 mg total) by mouth 3 (three) times daily. (Patient not taking: Reported on 05/06/2021), Disp:  90 tablet, Rfl: 0   insulin glargine (LANTUS) 100 UNIT/ML Solostar Pen, INJECT 20 UNITS INTO THE SKIN DAILY. (Patient not taking: Reported on 05/06/2021), Disp: 15 mL, Rfl: 0   levothyroxine (SYNTHROID, LEVOTHROID) 50 MCG tablet, Take 1 tablet (50 mcg total) by mouth daily. (Patient not taking: Reported on 05/06/2021), Disp: 30 tablet, Rfl: 0   losartan (COZAAR) 100 MG tablet, Take 1 tablet (100 mg total) by mouth daily. (Patient not taking: Reported on 05/06/2021), Disp: 90 tablet, Rfl: 3   methocarbamol (ROBAXIN) 500 MG tablet, Take 1 tablet (500 mg total) by mouth every 8 (eight) hours as needed for muscle spasms. (Patient not taking: No sig reported), Disp: 30 tablet, Rfl: 0   nortriptyline (PAMELOR) 50 MG capsule, Take 50 mg by mouth 2 (two) times daily. (Patient not taking: Reported on 05/06/2021), Disp: , Rfl:    pantoprazole (PROTONIX) 40 MG tablet, Take 40 mg by mouth daily. (Patient not taking: Reported on 05/06/2021), Disp: , Rfl:    torsemide (DEMADEX) 20 MG tablet, Take 4 tablets (80 mg total) by mouth 2 (two) times daily. (Patient not taking: Reported on 05/06/2021), Disp: 240 tablet, Rfl: 0 Allergies  Allergen Reactions   Gabapentin Nausea And Vomiting and Other (See Comments)    POSSIBLE SHAKING? TAKING MED CURRENTLY   Gabapentin    Lyrica [Pregabalin] Other (See Comments)    Shaking Pt still taking lyrica--"probably will stop taking"       Social History   Socioeconomic History   Marital status: Single    Spouse name: Not on file   Number of children: 1   Years of education: 78   Highest education level: 12th grade  Occupational History    Occupation: disabled  Tobacco Use   Smoking status: Not on file   Smokeless tobacco: Former    Types: Snuff  Vaping Use   Vaping Use: Former   Devices: 11/26/2018 "stopped months ago"  Substance and Sexual Activity   Alcohol use: Yes    Comment: occ   Drug use: Yes    Types: "Crack" cocaine, Marijuana, Oxycodone   Sexual activity: Not Currently  Other Topics Concern   Not on file  Social History Narrative   ** Merged History Encounter **       Lives in [place], in motel with sister.  They are looking to move but don't have a place to go yet.     Social Determinants of Health   Financial Resource Strain: Low Risk    Difficulty of Paying Living Expenses: Not very hard  Food Insecurity: Not on file  Transportation Needs: Not on file  Physical Activity: Not on file  Stress: Not on file  Social Connections: Not on file  Intimate Partner Violence: Not on file    Physical Exam      Future Appointments  Date Time Provider [place]  05/19/2021  9:15 AM Tommye Standard, Lorelee Cover., PA-C [facility]  06/02/2021  1:00 PM [facility] VASC 4 [facility]  07/13/2021  2:30 PM [facility] PA/NP [facility] None    BP 128/64   Pulse 82   Resp 18   Wt 222 lb (100.7 kg)   LMP  (LMP Unknown)   SpO2 98%   BMI 38.11 kg/m   Weight yesterday-222-in hosp  Last visit weight-222  Pt just got home from hosp yesterday-pt had a fall and hit her head.  There were some med changes.  -she goes to PCP today for f/u and will get her  to order more of the trulicity.  Refills needed--allopurinol, symbicort, albuterol. Keflex.  [facility] needs new rx for these meds except the keflex-pt states she will go by and pick up the med.  I made list of refills needed for her PCP and pt will take when she goes today.  Pt denies pain, no c/p, no dizziness.   Meds verified and pill box refilled.     Marylouise Stacks, [place] [facility] Paramedic  05/06/21

## 2021-05-09 LAB — CULTURE, BLOOD (ROUTINE X 2)
Culture: NO GROWTH
Culture: NO GROWTH
Special Requests: ADEQUATE
Special Requests: ADEQUATE

## 2021-05-13 ENCOUNTER — Other Ambulatory Visit (HOSPITAL_COMMUNITY): Payer: Self-pay
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
## 2021-05-18 NOTE — H&P (View-Only) (Signed)
Cardiology Office Note:    Date:  05/19/2021   ID:  [name], DOB 11-27-1960, MRN [id number]  PCP:  [name], [place] Referring MD: [name], [name]  Cardiologist:  Karla Burow, MD / Dr. Jenkins Price  Reason for visit: F/U PAD  History of Present Illness:    [name] is a 60 y.o. female with a hx of PAD (right SFA atherectomy 03/2017, right transmetatarsal amputation 04/2017, left fem-pop bypass + left 5th toe amputation 2020, carotid stenosis s/p left CEA 2016, relatively mild CAD, chronic systolic/diastolic CHF, paroxysmal atrial fibrillation, tobacco use with 100+ pack years and prior substance abuse.  She was admitted June 20 through May 05 2021 for acute on chronic heart failure. She was diuresed with IV Lasix.  Her losartan was held until follow-up with cardiology due to AKI.    Her last PAD studies:  - Carotid duplex 05/2020 showing total occlusion of the RICA, mild ICA stenosis s/p CEA.  Rec: repeat in 1 year. - Vascular US of LE bypass graft 12/2019: patent fem-ATA bypass graft with no stenosis - ABIs 12/2019: resting right ABIs indicate severe R lower arterial disease.  Resting left ABIs are within normal range  Today, she comes to clinic alone.  She states she has had a "hole" on the bottom of her R foot x 3-4 weeks.  Chart review, shows last wound clinic appt 04/05/2021 when she had this area debrided.  She missed her wound clinic appt 6/21.  She was given a special shoe to avoid putting pressure on this ulcer, but she states she tripped twice and has stopped wearing it.  She is still smoking 1 pack/day (smoking since 60 years old).  She mentions not being able to see her DM doctor because she has a delayed payment.  Her A1C is 9.7%.  During her recent hospitalization, she was prescribed Keflex for RLE cellulitis.  She describes significant pain in both legs from the knee down.  States that it feels like she is walking on  pins and needles/ rocks and has foot numbness.  She has a squeezing pain all the time, worse with walking and leg elevation.    She is currently living with her sister.  She uses a cane, walking and sometimes an electric scooter to get around.  She has a hospital bed at home.  Relating to her other medical conditions, she denies TIA/CVA symptoms, SOB, CP and lightheadedness.  She sometimes has indigestion/burp sensation.  She sometimes forgets to transmit her cardiomems.    Past Medical History:  Diagnosis Date   Alcohol abuse    Alcoholic cirrhosis ([place])    Anemia    Anxiety    Arthritis    "knees" (11/26/2018)   B12 deficiency    CAD (coronary artery disease)    a. 11/10/2014 Cath: LM nl, LAD min irregs, D1 30 ost, D2 50d, LCX 23m OM1 80 p/m (1.5 mm vessel), OM2 472mRCA nondom 907mmed rx.. Demand ischemia in the setting of rapid a-fib.   Cardiomyopathy ([place]  Carotid artery disease ([place]  a. 01/2015 Carotid Angio: RICA [id number] L CEA; c. 05/2019 Carotid U/S: RICA 100. RECA >50. LICA [id number] Cellulitis    lower extremities   CHF (congestive heart failure) ([name])    Chronic combined systolic and diastolic CHF (congestive heart failure) ([facility]CC[place]  a. 10/2014 Echo: EF 40-45%;  b. 10/2018 Echo: EF 45-50%, gr2 DD; c. 11/2019 Echo: EF 50%, mild LVH, gr2 DD (restrictive), antlat HK, Nl RV fxn. Mild BAE. RVSP 59.23mHg.   CKD (chronic kidney disease), stage III ([name])    Cocaine abuse ([name])    COPD (chronic obstructive pulmonary disease) ([name])    Critical lower limb ischemia ([name])    Depression    Diabetes mellitus without complication ([name])    Diabetic peripheral neuropathy ([name])    DVT (deep venous thrombosis) ([name])    Dyspnea    Elevated troponin    a. Chronic elevation.   GERD (gastroesophageal reflux disease)    Hyperlipemia    Hypertension    Hypokalemia    Hypomagnesemia    Hypothyroidism    Marijuana abuse    Narcotic abuse (HRegino Ramirez    Noncompliance    NSVT  (nonsustained ventricular tachycardia) ([name])    Obesity    PAF (paroxysmal atrial fibrillation) ([name])    Paroxysmal atrial tachycardia ([name])    Peripheral arterial disease ([place]    a. 01/2015 Angio/PTA: RSFA 99 (atherectomy/pta) - 1 vessel runoff via diff dzs peroneal; b. 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   Pneumonia    "once or twice" (11/26/2018)   Poorly controlled type 2 diabetes mellitus (H[place]    Renal disorder    Renal insufficiency    a. 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   Sleep apnea    "couldn't handle wearing the mask" (11/26/2018)   Symptomatic bradycardia    a. Avoid AV blocking agent per EP. Prev req temp wire in 2017.   Tobacco abuse     Past Surgical History:  Procedure Laterality Date   ABDOMINAL AORTOGRAM N/A 06/26/2019   Procedure: ABDOMINAL AORTOGRAM;  Surgeon: AWellington Hampshire MD;  [facility];  Service: Cardiovascular;  Laterality: N/A;   AMPUTATION Right 06/14/2017   Procedure: Right foot transmetatarsal amputation;  Surgeon: DNewt Minion MD;  [place] [facility]  Service: Orthopedics;  Laterality: Right;   AMPUTATION Left 06/28/2019   Procedure: AMPUTATION LEFT FIFTH TOE;  Surgeon: ERosetta Posner MD;  [facility]  Service: Vascular;  Laterality: Left;   AMPUTATION TOE Right 04/28/2017   Procedure: AMPUTATION OF RIGHT SECOND RAY;  Surgeon: DNewt Minion MD;  [place] [facility] [place]  Service: Orthopedics;  Laterality: Right;   CARDIAC CATHETERIZATION     CARDIAC CATHETERIZATION N/A 01/12/2016   Procedure: Temporary Wire;  Surgeon: JMinus Breeding MD;  [facility];  Service: Cardiovascular;  Laterality: N/A;   CARDIOVERSION  ~ 02/2013   "twice"    CAROTID ANGIOGRAM N/A 01/15/2015   Procedure: CAROTID ANGIOGRAM;  Surgeon: JLorretta Harp MD;  [facility];  Service: Cardiovascular;  Laterality: N/A;   DILATION AND CURETTAGE OF UTERUS  1988   ENDARTERECTOMY Left 02/19/2015   Procedure: LEFT CAROTID  ENDARTERECTOMY ;  Surgeon: VSerafina Mitchell MD;  [place] [facility]  Service: Vascular;  Laterality: Left;   FEMORAL-TIBIAL BYPASS GRAFT Left 06/28/2019   Procedure: BYPASS GRAFT LEFT LEG FEMORAL TO ANTERIOR TIBIAL ARTERY using LEFT GREATER SAPHENOUS VEIN;  Surgeon: ERosetta Posner MD;  [facility]  Service: Vascular;  Laterality: Left;   LEFT HEART CATHETERIZATION WITH CORONARY ANGIOGRAM N/A 10/31/2014   Procedure: LEFT HEART CATHETERIZATION WITH CORONARY ANGIOGRAM;  Surgeon: CBurnell Blanks MD;  [facility];  Service: Cardiovascular;  Laterality: N/A;   LOWER EXTREMITY ANGIOGRAM N/A 09/10/2013  Procedure: LOWER EXTREMITY ANGIOGRAM;  Surgeon: [name], MD;  Location: [facility] C[facility] LAB;  Service: Cardiovascular;  Laterality: N/A;   LOWER EXTREMITY ANGIOGRAM N/A 01/15/2015   Procedure: LOWER EXTREMITY ANGIOGRAM;  Surgeon: [name], MD;  Location: [name] CATH LAB;  Service: Cardiovascular;  Laterality: N/A;   LOWER EXTREMITY ANGIOGRAPHY N/A 04/13/2017   Procedure: Lower Extremity Angiography;  Surgeon: [name], MD;  Location: [place] CV LAB;  Service: Cardiovascular;  Laterality: N/A;   LOWER EXTREMITY ANGIOGRAPHY Left 06/26/2019   Procedure: LOWER EXTREMITY ANGIOGRAPHY;  Surgeon: Wellington Hampshire, MD;  Location: [place] [facility];  Service: Cardiovascular;  Laterality: Left;   PERIPHERAL VASCULAR BALLOON ANGIOPLASTY Left 06/26/2019   Procedure: PERIPHERAL VASCULAR BALLOON ANGIOPLASTY;  Surgeon: Wellington Hampshire, MD;  Location: [place] [facility] LAB;  Service: Cardiovascular;  Laterality: Left;  unable to cross lt sfa occlusion   PERIPHERAL VASCULAR INTERVENTION  04/13/2017   Procedure: Peripheral Vascular Intervention;  Surgeon: [name], MD;  Location: [place] CV LAB;  Service: Cardiovascular;;   PRESSURE SENSOR/CARDIOMEMS N/A 02/05/2020   Procedure: PRESSURE SENSOR/CARDIOMEMS;  Surgeon: Larey Dresser, MD;  Location: [place] [facility] LAB;  Service:  Cardiovascular;  Laterality: N/A;   VEIN HARVEST Left 06/28/2019   Procedure: LEFT LEG GREATER SAPHENOUS VEIN HARVEST;  Surgeon: Rosetta Posner, MD;  [facility];  Service: Vascular;  Laterality: Left;    Current Medications: Current Meds  Medication Sig   ACCU-CHEK GUIDE test strip USE TO CHECK BLOOD SUGAR FOUR TIMES DAILY   albuterol (VENTOLIN HFA) 108 (90 Base) MCG/ACT inhaler Inhale 2 puffs into the lungs every 6 (six) hours as needed for wheezing or shortness of breath.   albuterol (VENTOLIN HFA) 108 (90 Base) MCG/ACT inhaler Inhale 2 puffs into the lungs every 6 (six) hours as needed for wheezing or shortness of breath.   allopurinol (ZYLOPRIM) 100 MG tablet Take 100 mg by mouth daily.   amiodarone (PACERONE) 200 MG tablet Take 0.5 tablets (100 mg total) by mouth daily.   amLODipine (NORVASC) 10 MG tablet Take 1 tablet (10 mg total) by mouth daily.   apixaban (ELIQUIS) 5 MG TABS tablet Take 5 mg by mouth 2 (two) times daily.   Ascorbic Acid (VITAMIN C PO) Take 1 tablet by mouth daily.   atorvastatin (LIPITOR) 40 MG tablet Take 40 mg by mouth every evening.   bisoprolol (ZEBETA) 5 MG tablet Take 1 tablet (5 mg total) by mouth daily.   budesonide-formoterol (SYMBICORT) 160-4.5 MCG/ACT inhaler Inhale 2 puffs into the lungs 2 (two) times daily.   colchicine 0.6 MG tablet Take 0.6 mg by mouth daily.   cyclobenzaprine (FLEXERIL) 10 MG tablet Take 1 tablet by mouth at bedtime.   Dulaglutide (TRULICITY [place]) Inject [id number] mg into the skin once a week.   empagliflozin (JARDIANCE) 10 MG TABS tablet Take 1 tablet (10 mg total) by mouth daily before breakfast.   FLUoxetine (PROZAC) 10 MG capsule Take 1 capsule (10 mg total) by mouth at bedtime.   folic acid (FOLVITE) 1 MG tablet Take 1 mg by mouth daily.   HUMALOG KWIKPEN 200 UNIT/ML KwikPen Inject 8 Units into the skin in the morning, at noon, and at bedtime.   hydrALAZINE (APRESOLINE) 25 MG tablet Take 1 tablet (25 mg total) by mouth 3 (three)  times daily.   insulin aspart (NOVOLOG) 100 UNIT/ML FlexPen INJECT 8 UNITS INTO THE SKIN THREE TIMES DAILY WITH MEALS.   insulin glargine (LANTUS) 100  UNIT/ML Solostar Pen INJECT 20 UNITS INTO THE SKIN DAILY.   insulin lispro (HUMALOG) 100 UNIT/ML KwikPen Inject 8 Units into the skin 3 (three) times daily with meals.   Insulin Pen Needle 32G X 4 MM MISC USE AS DIRECTED WITH INSULIN PENS   levothyroxine (SYNTHROID, LEVOTHROID) 50 MCG tablet Take 1 tablet (50 mcg total) by mouth daily.   losartan (COZAAR) 100 MG tablet Take 1 tablet (100 mg total) by mouth daily.   methocarbamol (ROBAXIN) 500 MG tablet Take 1 tablet (500 mg total) by mouth every 8 (eight) hours as needed for muscle spasms.   Multiple Vitamins-Minerals (VITAMIN D3 COMPLETE PO) Take 1 capsule by mouth daily in the afternoon.   nortriptyline (PAMELOR) 50 MG capsule Take 50 mg by mouth 2 (two) times daily.   omega-3 fish oil (MAXEPA) 1000 MG CAPS capsule Take 1 capsule by mouth daily.   pantoprazole (PROTONIX) 40 MG tablet Take 1 tablet (40 mg total) by mouth daily.   spironolactone (ALDACTONE) 25 MG tablet Take 1 tablet by mouth daily.   torsemide (DEMADEX) 20 MG tablet Take 4 tablets (80 mg total) by mouth every morning AND 3 tablets (60 mg total) every evening. (Patient taking differently: Take 4 tablets (80 mg total) by mouth every morning AND 4 tablets (80 mg total) every evening.)   TRULICITY 1.5 0000000 SOPN Inject 1.5 mg into the skin once a week. Every Friday   VITAMIN D PO Take 1 tablet by mouth daily.   [DISCONTINUED] amiodarone (PACERONE) 200 MG tablet Take 0.5 tablets (100 mg total) by mouth daily.   [DISCONTINUED] amLODipine (NORVASC) 10 MG tablet Take 1 tablet (10 mg total) by mouth daily.   [DISCONTINUED] apixaban (ELIQUIS) 5 MG TABS tablet TAKE 1 TABLET(5 MG) BY MOUTH TWICE DAILY   [DISCONTINUED] atorvastatin (LIPITOR) 40 MG tablet Take 40 mg by mouth daily.   [DISCONTINUED] bisoprolol (ZEBETA) 5 MG tablet Take 5 mg  by mouth daily.   [DISCONTINUED] budesonide-formoterol (SYMBICORT) 160-4.5 MCG/ACT inhaler Inhale 2 puffs into the lungs 2 (two) times daily.   [DISCONTINUED] colchicine 0.6 MG tablet Take 0.6 mg by mouth daily.   [DISCONTINUED] cyclobenzaprine (FLEXERIL) 10 MG tablet Take 1 tablet by mouth 3 times/day as needed-between meals & bedtime for muscle spasms.   [DISCONTINUED] empagliflozin (JARDIANCE) 10 MG TABS tablet Take 10 mg by mouth daily.   [DISCONTINUED] FLUoxetine (PROZAC) 10 MG tablet Take 10 mg by mouth at bedtime.   [DISCONTINUED] folic acid (FOLVITE) 1 MG tablet Take 1 tablet (1 mg total) by mouth daily.   [DISCONTINUED] hydrALAZINE (APRESOLINE) 25 MG tablet Take 1 tablet (25 mg total) by mouth 3 (three) times daily.   [DISCONTINUED] insulin glargine (LANTUS SOLOSTAR) 100 UNIT/ML Solostar Pen Inject 20 Units into the skin at bedtime.   [DISCONTINUED] levothyroxine (SYNTHROID) 50 MCG tablet Take 50 mcg by mouth daily before breakfast.   [DISCONTINUED] nortriptyline (PAMELOR) 50 MG capsule Take 50 mg by mouth 2 (two) times daily.   [DISCONTINUED] pantoprazole (PROTONIX) 40 MG tablet Take 40 mg by mouth daily.   [DISCONTINUED] torsemide (DEMADEX) 20 MG tablet Take 4 tablets (80 mg total) by mouth 2 (two) times daily.   [DISCONTINUED] VITAMIN E PO Take 1 tablet by mouth daily.     Allergies:   Gabapentin, Gabapentin, and Lyrica [pregabalin]   Social History   Socioeconomic History   Marital status: Single    Spouse name: Not on file   Number of children: 1   Years of education: 53  Highest education level: 12th grade  Occupational History   Occupation: disabled  Tobacco Use   Smoking status: Every Day    Packs/day: 1.00    Years: 44.00    Pack years: 44.00    Types: E-cigarettes, Cigarettes   Smokeless tobacco: Former    Types: Snuff  Vaping Use   Vaping Use: Former   Devices: 11/26/2018 "stopped months ago"  Substance and Sexual Activity   Alcohol use: Yes    Comment:  occ   Drug use: Yes    Types: "Crack" cocaine, Marijuana, Oxycodone   Sexual activity: Not Currently  Other Topics Concern   Not on file  Social History Narrative   ** Merged History Encounter **       Lives in [place], in motel with sister.  They are looking to move but don't have a place to go yet.     Social Determinants of Health   Financial Resource Strain: Low Risk    Difficulty of Paying Living Expenses: Not very hard  Food Insecurity: Not on file  Transportation Needs: Not on file  Physical Activity: Not on file  Stress: Not on file  Social Connections: Not on file     Family History: The patient's family history includes Breast cancer in her mother; Cancer in her mother; Clotting disorder in her mother; Diabetes in her mother; Emphysema in her sister; Heart attack in her mother; Heart disease in her father and mother; Hypertension in her father and mother.  ROS:   Please see the history of present illness.     EKGs/Labs/Other Studies Reviewed:    EKG: NSR, rate 61, QRS 91m.  Recent Labs: 03/08/2021: TSH 2.921 05/03/2021: ALT 28 05/04/2021: B Natriuretic Peptide 761.3; Magnesium 2.6 05/05/2021: BUN 33; Creatinine, Ser 1.60; Hemoglobin 9.6; Platelets 332; Potassium 4.0; Sodium 135  Recent Lipid Panel    Component Value Date/Time   CHOL 129 03/08/2021 1551   TRIG 115 03/08/2021 1551   HDL 47 03/08/2021 1551   CHOLHDL 2.7 03/08/2021 1551   VLDL 23 03/08/2021 1551   LDLCALC 59 03/08/2021 1551    Physical Exam:    VS:  BP 130/60 (BP Location: Left Arm)   Pulse 61   Ht '5\' 4"'$  (1.626 m)   Wt 222 lb (100.7 kg)   LMP  (LMP Unknown)   BMI 38.11 kg/m     Wt Readings from Last 3 Encounters:  05/19/21 222 lb (100.7 kg)  05/13/21 225 lb (102.1 kg)  05/06/21 222 lb (100.7 kg)     GEN:  Well nourished, well developed in no acute distress HEENT: Normal NECK: JVD to clavicle at 90 degrees; R carotid bruit CARDIAC: RRR, no murmurs, rubs, gallops RESPIRATORY:  Clear to  auscultation without rales, wheezing or rhonchi  ABDOMEN: Soft, non-tender, non-distended MUSCULOSKELETAL/SKIN: open quarter sized ulcer on ball of R foot, odorous, mild erythema, right transmetatarsal amputation, poor pedal pulses.  L foot with no wounds, decreased pedal pulses.  Bilateral venous statis discoloration.  1+ bilateral LE edema to mid shin. NEUROLOGIC:  Alert and oriented PSYCHIATRIC:  Normal affect   ASSESSMENT AND PLAN    1. PAD (peripheral artery disease)/ Critical limb ischemia with DM/vascular ulcer on R foot - Open ulcer on ball of the R foot with mild erythema, no active drainage - Poor wound healing with poorly controlled DM and continued tobacco use.  Strongly encouraged tobacco cessation and following up with PCP/endocrine for DM management.  Follow-up with wound clinic. - Known occluded  R SFA - reviewed prev imaging with Dr. Gwenlyn Found.   - Pt seen with Dr. Gwenlyn Found today --> will schedule LE angiogram to assess opportunities to improve blood flow to open wound.  Pt is at risk for wound infection/amputation.  Pt agrees to proceed with LE angiogram.  - Hold Eliquis 2 days prior to procedure.   - Continue atorvastatin. - Has carotid duplex scheduled for later this month.  2. Mixed hyperlipidemia, well controlled. - LDL 59 in 02/2021, continue atorvastatin.  3. Tobacco use - Cessation encouraged for wound healing and to prevent further vascular disease.  4.  DM, poorly controlled.   - A1C 9.7%.   - Recommend f/u with PCP/endocrine to achieve A1C <7%.  5.  Chronic systolic/diastolic CHF, relatively euvolemic.  - Hospitalized 6/20-22 for CHF.  Losartan held with AKI. - Check BMET today to see if able to restart losartan.      Disposition: LE angio in 2 weeks with Dr. Gwenlyn Found.  Otherwise, follow-up with CHF clinic as scheduled in August.  Shared Decision Making/Informed Consent The risks [stroke (1 in 1000), death (1 in 31), kidney failure [usually temporary] (1 in 500),  bleeding (1 in 200), allergic reaction [possibly serious] (1 in 200)], benefits (diagnostic support and management of coronary artery disease) and alternatives of a LE angiogram were discussed in detail with Ms. Sineath and she is willing to proceed.    Medication Adjustments/Labs and Tests Ordered: Current medicines are reviewed at length with the patient today.  Concerns regarding medicines are outlined above.  Orders Placed This Encounter  Procedures   CBC   Basic metabolic panel   EKG [id number]   VAS [place] ABI WITH/WO TBI   VAS [place] LOWER EXTREMITY ARTERIAL DUPLEX   No orders of the defined types were placed in this encounter.   Patient Instructions  Medication Instructions:  No Changes In Medications at this time. PLEASE FOLLOW MEDICATION INSTRUCTION GIVEN FOR PV PROCEDURE *If you need a refill on your cardiac medications before your next appointment, please call your pharmacy*  Lab Work: BMET AND CBC July 11th- NO APPOINTMENT NEEDED  If you have labs (blood work) drawn today and your tests are completely normal, you will receive your results only by: [place] (if you have MyChart) OR A paper copy in the mail If you have any lab test that is abnormal or we need to change your treatment, we will call you to review the results.  Testing/Procedures: Your physician has requested that you have a peripheral vascular angiogram. This exam is performed at the hospital. During this exam IV contrast is used to look at arterial blood flow. Please review the information sheet given for details.  Your physician has requested that you have a lower extremity arterial duplex ONE WEEK POST PROCEDURE. During this test, ultrasound is used to evaluate arterial blood flow in the legs. Allow one hour for this exam. There are no restrictions or special instructions. This will take place at [place], [address].   Your physician has requested that you have an ankle brachial index (ABI) ONE WEEK  POST PROCEDURE. During this test an ultrasound and blood pressure cuff are used to evaluate the arteries that supply the arms and legs with blood. Allow thirty minutes for this exam. There are no restrictions or special instructions. This will take place at [place], [address].   Follow-Up: At [name], you and your health needs are our priority.  As part of our  continuing mission to provide you with exceptional heart care, we have created designated Provider Care Teams.  These Care Teams include your primary Cardiologist (physician) and Advanced Practice Providers (APPs -  Physician Assistants and Nurse Practitioners) who all work together to provide you with the care you need, when you need it.   Your next appointment:   2 week(s) POST PROCEDURE  The format for your next appointment:   In Person  Provider:   Quay Burow, MD  Other Instructions    [place] [address] Dept: [phone] Loc: [place]  05/19/2021  You are scheduled for a Peripheral Angiogram on Monday, July 18 with Dr. Quay Price.  1. Please arrive at the [name] (Main Entrance A) at [facility]: [address] [place],  [id number] at 6:00 AM FOR PRE HYDRATION. Free valet parking service is available.   Special note: Every effort is made to have your procedure done on time. Please understand that emergencies sometimes delay scheduled procedures.  2. Diet: Do not eat solid foods after midnight.  The patient may have clear liquids until 5am upon the day of the procedure.  3. Labs: You will need to have blood drawn on Monday, July 11 at [place]  Open: 8am - 5pm (Lunch 12:30 - 1:30)   Phone: [phone]. You do not need to be fasting.  4. Medication instructions in preparation for your procedure:    Contrast Allergy: No  Stop taking Eliquis (Apixiban) on Saturday, July 16.  Take only 50 units of insulin the night before your procedure. Do not take any insulin on the day of the procedure. (LANTUS)  DO NOT TAKE DIABETIC MEDICATIONS ON THE MORNING OF PROCEDURE DO NOT TAKE JARDIANCE THE MORNING OF PROCEDURE DO NOT TAKE TORSEMIDE OR SPIRONOLACTONE THE MORNING OF PROCEDURE  On the morning of your procedure, take your Aspirin '81mg'$  and any morning medicines NOT listed above.  You may use sips of water.  5. Plan for one night stay--bring personal belongings. 6. Bring a current list of your medications and current insurance cards. 7. You MUST have a responsible person to drive you home. 8. Someone MUST be with you the first 24 hours after you arrive home or your discharge will be delayed. 9. Please wear clothes that are easy to get on and off and wear slip-on shoes.  Thank you for allowing [place] to care for you!   -- [facility] Invasive Cardiovascular services    Signed, Gaston Islam  05/19/2021 1:27 PM    [facility]

## 2021-05-18 NOTE — Progress Notes (Addendum)
Cardiology Office Note:    Date:  05/19/2021   ID:  [name], DOB 1961/04/23, MRN [id number]  PCP:  [facility] [place] Referring MD: [facility]  Cardiologist:  Quay Burow, MD / Dr. Jenkins Rouge  Reason for visit: F/U PAD  History of Present Illness:    [name] is a 60 y.o. female with a hx of PAD (right SFA atherectomy 03/2017, right transmetatarsal amputation 04/2017, left fem-pop bypass + left 5th toe amputation 2020, carotid stenosis s/p left CEA 2016, relatively mild CAD, chronic systolic/diastolic CHF, paroxysmal atrial fibrillation, tobacco use with 100+ pack years and prior substance abuse.  She was admitted June 20 through May 05 2021 for acute on chronic heart failure. She was diuresed with IV Lasix.  Her losartan was held until follow-up with cardiology due to AKI.    Her last PAD studies:  - Carotid duplex 05/2020 showing total occlusion of the RICA, mild ICA stenosis s/p CEA.  Rec: repeat in 1 year. - Vascular US of LE bypass graft 12/2019: patent fem-ATA bypass graft with no stenosis - ABIs 12/2019: resting right ABIs indicate severe R lower arterial disease.  Resting left ABIs are within normal range  Today, she comes to clinic alone.  She states she has had a "hole" on the bottom of her R foot x 3-4 weeks.  Chart review, shows last wound clinic appt 04/05/2021 when she had this area debrided.  She missed her wound clinic appt 6/21.  She was given a special shoe to avoid putting pressure on this ulcer, but she states she tripped twice and has stopped wearing it.  She is still smoking 1 pack/day (smoking since 60 years old).  She mentions not being able to see her DM doctor because she has a delayed payment.  Her A1C is 9.7%.  During her recent hospitalization, she was prescribed Keflex for RLE cellulitis.  She describes significant pain in both legs from the knee down.  States that it feels like she is walking on  pins and needles/ rocks and has foot numbness.  She has a squeezing pain all the time, worse with walking and leg elevation.    She is currently living with her sister.  She uses a cane, walking and sometimes an electric scooter to get around.  She has a hospital bed at home.  Relating to her other medical conditions, she denies TIA/CVA symptoms, SOB, CP and lightheadedness.  She sometimes has indigestion/burp sensation.  She sometimes forgets to transmit her cardiomems.    Past Medical History:  Diagnosis Date   Alcohol abuse    Alcoholic cirrhosis ([place])    Anemia    Anxiety    Arthritis    "knees" (11/26/2018)   B12 deficiency    CAD (coronary artery disease)    a. 11/10/2014 Cath: LM nl, LAD min irregs, D1 30 ost, D2 50d, LCX 63m OM1 80 p/m (1.5 mm vessel), OM2 449mRCA nondom 9019mmed rx.. Demand ischemia in the setting of rapid a-fib.   Cardiomyopathy ([place]  Carotid artery disease ([place]  a. 01/2015 Carotid Angio: RICA [id number] L CEA; c. 05/2019 Carotid U/S: RICA 100. RECA >50. LICA [id number] Cellulitis    lower extremities   CHF (congestive heart failure) ([name])    Chronic combined systolic and diastolic CHF (congestive heart failure) ([place]CC[place]  a. 10/2014 Echo: EF 40-45%;  b. 10/2018 Echo: EF 45-50%, gr2 DD; c. 11/2019 Echo: EF 50%, mild LVH, gr2 DD (restrictive), antlat HK, Nl RV fxn. Mild BAE. RVSP 59.43mHg.   CKD (chronic kidney disease), stage III ([name])    Cocaine abuse ([name])    COPD (chronic obstructive pulmonary disease) ([name])    Critical lower limb ischemia ([name])    Depression    Diabetes mellitus without complication ([name])    Diabetic peripheral neuropathy ([name])    DVT (deep venous thrombosis) ([name])    Dyspnea    Elevated troponin    a. Chronic elevation.   GERD (gastroesophageal reflux disease)    Hyperlipemia    Hypertension    Hypokalemia    Hypomagnesemia    Hypothyroidism    Marijuana abuse    Narcotic abuse ([place]    Noncompliance    NSVT  (nonsustained ventricular tachycardia) ([name])    Obesity    PAF (paroxysmal atrial fibrillation) ([name])    Paroxysmal atrial tachycardia ([name])    Peripheral arterial disease ([place]    a. 01/2015 Angio/PTA: RSFA 99 (atherectomy/pta) - 1 vessel runoff via diff dzs peroneal; b. 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   Pneumonia    "once or twice" (11/26/2018)   Poorly controlled type 2 diabetes mellitus ([place]    Renal disorder    Renal insufficiency    a. 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   Sleep apnea    "couldn't handle wearing the mask" (11/26/2018)   Symptomatic bradycardia    a. Avoid AV blocking agent per EP. Prev req temp wire in 2017.   Tobacco abuse     Past Surgical History:  Procedure Laterality Date   ABDOMINAL AORTOGRAM N/A 06/26/2019   Procedure: ABDOMINAL AORTOGRAM;  Surgeon: AWellington Hampshire MD;  [facility];  Service: Cardiovascular;  Laterality: N/A;   AMPUTATION Right 06/14/2017   Procedure: Right foot transmetatarsal amputation;  Surgeon: DNewt Minion MD;  [facility]  Service: Orthopedics;  Laterality: Right;   AMPUTATION Left 06/28/2019   Procedure: AMPUTATION LEFT FIFTH TOE;  Surgeon: ERosetta Posner MD;  [facility]  Service: Vascular;  Laterality: Left;   AMPUTATION TOE Right 04/28/2017   Procedure: AMPUTATION OF RIGHT SECOND RAY;  Surgeon: DNewt Minion MD;  [facility]  Service: Orthopedics;  Laterality: Right;   CARDIAC CATHETERIZATION     CARDIAC CATHETERIZATION N/A 01/12/2016   Procedure: Temporary Wire;  Surgeon: JMinus Breeding MD;  [facility];  Service: Cardiovascular;  Laterality: N/A;   CARDIOVERSION  ~ 02/2013   "twice"    CAROTID ANGIOGRAM N/A 01/15/2015   Procedure: CAROTID ANGIOGRAM;  Surgeon: JLorretta Harp MD;  [facility];  Service: Cardiovascular;  Laterality: N/A;   DILATION AND CURETTAGE OF UTERUS  1988   ENDARTERECTOMY Left 02/19/2015   Procedure: LEFT CAROTID  ENDARTERECTOMY ;  Surgeon: VSerafina Mitchell MD;  [place] [facility]  Service: Vascular;  Laterality: Left;   FEMORAL-TIBIAL BYPASS GRAFT Left 06/28/2019   Procedure: BYPASS GRAFT LEFT LEG FEMORAL TO ANTERIOR TIBIAL ARTERY using LEFT GREATER SAPHENOUS VEIN;  Surgeon: ERosetta Posner MD;  [place] [facility]  Service: Vascular;  Laterality: Left;   LEFT HEART CATHETERIZATION WITH CORONARY ANGIOGRAM N/A 10/31/2014   Procedure: LEFT HEART CATHETERIZATION WITH CORONARY ANGIOGRAM;  Surgeon: CBurnell Blanks MD;  [facility];  Service: Cardiovascular;  Laterality: N/A;   LOWER EXTREMITY ANGIOGRAM N/A 09/10/2013  Procedure: LOWER EXTREMITY ANGIOGRAM;  Surgeon: [name], MD;  Location: [facility] CATH LAB;  Service: Cardiovascular;  Laterality: N/A;   LOWER EXTREMITY ANGIOGRAM N/A 01/15/2015   Procedure: LOWER EXTREMITY ANGIOGRAM;  Surgeon: [name], MD;  Location: [facility] CATH LAB;  Service: Cardiovascular;  Laterality: N/A;   LOWER EXTREMITY ANGIOGRAPHY N/A 04/13/2017   Procedure: Lower Extremity Angiography;  Surgeon: [name], MD;  Location: [place] CV LAB;  Service: Cardiovascular;  Laterality: N/A;   LOWER EXTREMITY ANGIOGRAPHY Left 06/26/2019   Procedure: LOWER EXTREMITY ANGIOGRAPHY;  Surgeon: Wellington Hampshire, MD;  Location: [place] CV LAB;  Service: Cardiovascular;  Laterality: Left;   PERIPHERAL VASCULAR BALLOON ANGIOPLASTY Left 06/26/2019   Procedure: PERIPHERAL VASCULAR BALLOON ANGIOPLASTY;  Surgeon: Wellington Hampshire, MD;  Location: [place] CV LAB;  Service: Cardiovascular;  Laterality: Left;  unable to cross lt sfa occlusion   PERIPHERAL VASCULAR INTERVENTION  04/13/2017   Procedure: Peripheral Vascular Intervention;  Surgeon: [name], MD;  Location: [place] CV LAB;  Service: Cardiovascular;;   PRESSURE SENSOR/CARDIOMEMS N/A 02/05/2020   Procedure: PRESSURE SENSOR/CARDIOMEMS;  Surgeon: Larey Dresser, MD;  Location: [place] CV LAB;  Service:  Cardiovascular;  Laterality: N/A;   VEIN HARVEST Left 06/28/2019   Procedure: LEFT LEG GREATER SAPHENOUS VEIN HARVEST;  Surgeon: Rosetta Posner, MD;  [facility];  Service: Vascular;  Laterality: Left;    Current Medications: Current Meds  Medication Sig   ACCU-CHEK GUIDE test strip USE TO CHECK BLOOD SUGAR FOUR TIMES DAILY   albuterol (VENTOLIN HFA) 108 (90 Base) MCG/ACT inhaler Inhale 2 puffs into the lungs every 6 (six) hours as needed for wheezing or shortness of breath.   albuterol (VENTOLIN HFA) 108 (90 Base) MCG/ACT inhaler Inhale 2 puffs into the lungs every 6 (six) hours as needed for wheezing or shortness of breath.   allopurinol (ZYLOPRIM) 100 MG tablet Take 100 mg by mouth daily.   amiodarone (PACERONE) 200 MG tablet Take 0.5 tablets (100 mg total) by mouth daily.   amLODipine (NORVASC) 10 MG tablet Take 1 tablet (10 mg total) by mouth daily.   apixaban (ELIQUIS) 5 MG TABS tablet Take 5 mg by mouth 2 (two) times daily.   Ascorbic Acid (VITAMIN C PO) Take 1 tablet by mouth daily.   atorvastatin (LIPITOR) 40 MG tablet Take 40 mg by mouth every evening.   bisoprolol (ZEBETA) 5 MG tablet Take 1 tablet (5 mg total) by mouth daily.   budesonide-formoterol (SYMBICORT) 160-4.5 MCG/ACT inhaler Inhale 2 puffs into the lungs 2 (two) times daily.   colchicine 0.6 MG tablet Take 0.6 mg by mouth daily.   cyclobenzaprine (FLEXERIL) 10 MG tablet Take 1 tablet by mouth at bedtime.   Dulaglutide (TRULICITY [place]) Inject [id number] mg into the skin once a week.   empagliflozin (JARDIANCE) 10 MG TABS tablet Take 1 tablet (10 mg total) by mouth daily before breakfast.   FLUoxetine (PROZAC) 10 MG capsule Take 1 capsule (10 mg total) by mouth at bedtime.   folic acid (FOLVITE) 1 MG tablet Take 1 mg by mouth daily.   HUMALOG KWIKPEN 200 UNIT/ML KwikPen Inject 8 Units into the skin in the morning, at noon, and at bedtime.   hydrALAZINE (APRESOLINE) 25 MG tablet Take 1 tablet (25 mg total) by mouth 3 (three)  times daily.   insulin aspart (NOVOLOG) 100 UNIT/ML FlexPen INJECT 8 UNITS INTO THE SKIN THREE TIMES DAILY WITH MEALS.   insulin glargine (LANTUS) 100  UNIT/ML Solostar Pen INJECT 20 UNITS INTO THE SKIN DAILY.   insulin lispro (HUMALOG) 100 UNIT/ML KwikPen Inject 8 Units into the skin 3 (three) times daily with meals.   Insulin Pen Needle 32G X 4 MM MISC USE AS DIRECTED WITH INSULIN PENS   levothyroxine (SYNTHROID, LEVOTHROID) 50 MCG tablet Take 1 tablet (50 mcg total) by mouth daily.   losartan (COZAAR) 100 MG tablet Take 1 tablet (100 mg total) by mouth daily.   methocarbamol (ROBAXIN) 500 MG tablet Take 1 tablet (500 mg total) by mouth every 8 (eight) hours as needed for muscle spasms.   Multiple Vitamins-Minerals (VITAMIN D3 COMPLETE PO) Take 1 capsule by mouth daily in the afternoon.   nortriptyline (PAMELOR) 50 MG capsule Take 50 mg by mouth 2 (two) times daily.   omega-3 fish oil (MAXEPA) 1000 MG CAPS capsule Take 1 capsule by mouth daily.   pantoprazole (PROTONIX) 40 MG tablet Take 1 tablet (40 mg total) by mouth daily.   spironolactone (ALDACTONE) 25 MG tablet Take 1 tablet by mouth daily.   torsemide (DEMADEX) 20 MG tablet Take 4 tablets (80 mg total) by mouth every morning AND 3 tablets (60 mg total) every evening. (Patient taking differently: Take 4 tablets (80 mg total) by mouth every morning AND 4 tablets (80 mg total) every evening.)   TRULICITY 1.5 0000000 SOPN Inject 1.5 mg into the skin once a week. Every Friday   VITAMIN D PO Take 1 tablet by mouth daily.   [DISCONTINUED] amiodarone (PACERONE) 200 MG tablet Take 0.5 tablets (100 mg total) by mouth daily.   [DISCONTINUED] amLODipine (NORVASC) 10 MG tablet Take 1 tablet (10 mg total) by mouth daily.   [DISCONTINUED] apixaban (ELIQUIS) 5 MG TABS tablet TAKE 1 TABLET(5 MG) BY MOUTH TWICE DAILY   [DISCONTINUED] atorvastatin (LIPITOR) 40 MG tablet Take 40 mg by mouth daily.   [DISCONTINUED] bisoprolol (ZEBETA) 5 MG tablet Take 5 mg  by mouth daily.   [DISCONTINUED] budesonide-formoterol (SYMBICORT) 160-4.5 MCG/ACT inhaler Inhale 2 puffs into the lungs 2 (two) times daily.   [DISCONTINUED] colchicine 0.6 MG tablet Take 0.6 mg by mouth daily.   [DISCONTINUED] cyclobenzaprine (FLEXERIL) 10 MG tablet Take 1 tablet by mouth 3 times/day as needed-between meals & bedtime for muscle spasms.   [DISCONTINUED] empagliflozin (JARDIANCE) 10 MG TABS tablet Take 10 mg by mouth daily.   [DISCONTINUED] FLUoxetine (PROZAC) 10 MG tablet Take 10 mg by mouth at bedtime.   [DISCONTINUED] folic acid (FOLVITE) 1 MG tablet Take 1 tablet (1 mg total) by mouth daily.   [DISCONTINUED] hydrALAZINE (APRESOLINE) 25 MG tablet Take 1 tablet (25 mg total) by mouth 3 (three) times daily.   [DISCONTINUED] insulin glargine (LANTUS SOLOSTAR) 100 UNIT/ML Solostar Pen Inject 20 Units into the skin at bedtime.   [DISCONTINUED] levothyroxine (SYNTHROID) 50 MCG tablet Take 50 mcg by mouth daily before breakfast.   [DISCONTINUED] nortriptyline (PAMELOR) 50 MG capsule Take 50 mg by mouth 2 (two) times daily.   [DISCONTINUED] pantoprazole (PROTONIX) 40 MG tablet Take 40 mg by mouth daily.   [DISCONTINUED] torsemide (DEMADEX) 20 MG tablet Take 4 tablets (80 mg total) by mouth 2 (two) times daily.   [DISCONTINUED] VITAMIN E PO Take 1 tablet by mouth daily.     Allergies:   Gabapentin, Gabapentin, and Lyrica [pregabalin]   Social History   Socioeconomic History   Marital status: Single    Spouse name: Not on file   Number of children: 1   Years of education: 11  Highest education level: 12th grade  Occupational History   Occupation: disabled  Tobacco Use   Smoking status: Every Day    Packs/day: 1.00    Years: 44.00    Pack years: 44.00    Types: E-cigarettes, Cigarettes   Smokeless tobacco: Former    Types: Snuff  Vaping Use   Vaping Use: Former   Devices: 11/26/2018 "stopped months ago"  Substance and Sexual Activity   Alcohol use: Yes    Comment:  occ   Drug use: Yes    Types: "Crack" cocaine, Marijuana, Oxycodone   Sexual activity: Not Currently  Other Topics Concern   Not on file  Social History Narrative   ** Merged History Encounter **       Lives in [place], in motel with sister.  They are looking to move but don't have a place to go yet.     Social Determinants of Health   Financial Resource Strain: Low Risk    Difficulty of Paying Living Expenses: Not very hard  Food Insecurity: Not on file  Transportation Needs: Not on file  Physical Activity: Not on file  Stress: Not on file  Social Connections: Not on file     Family History: The patient's family history includes Breast cancer in her mother; Cancer in her mother; Clotting disorder in her mother; Diabetes in her mother; Emphysema in her sister; Heart attack in her mother; Heart disease in her father and mother; Hypertension in her father and mother.  ROS:   Please see the history of present illness.     EKGs/Labs/Other Studies Reviewed:    EKG: NSR, rate 61, QRS 56m.  Recent Labs: 03/08/2021: TSH 2.921 05/03/2021: ALT 28 05/04/2021: B Natriuretic Peptide 761.3; Magnesium 2.6 05/05/2021: BUN 33; Creatinine, Ser 1.60; Hemoglobin 9.6; Platelets 332; Potassium 4.0; Sodium 135  Recent Lipid Panel    Component Value Date/Time   CHOL 129 03/08/2021 1551   TRIG 115 03/08/2021 1551   HDL 47 03/08/2021 1551   CHOLHDL 2.7 03/08/2021 1551   VLDL 23 03/08/2021 1551   LDLCALC 59 03/08/2021 1551    Physical Exam:    VS:  BP 130/60 (BP Location: Left Arm)   Pulse 61   Ht '5\' 4"'$  (1.626 m)   Wt 222 lb (100.7 kg)   LMP  (LMP Unknown)   BMI 38.11 kg/m     Wt Readings from Last 3 Encounters:  05/19/21 222 lb (100.7 kg)  05/13/21 225 lb (102.1 kg)  05/06/21 222 lb (100.7 kg)     GEN:  Well nourished, well developed in no acute distress HEENT: Normal NECK: JVD to clavicle at 90 degrees; R carotid bruit CARDIAC: RRR, no murmurs, rubs, gallops RESPIRATORY:  Clear to  auscultation without rales, wheezing or rhonchi  ABDOMEN: Soft, non-tender, non-distended MUSCULOSKELETAL/SKIN: open quarter sized ulcer on ball of R foot, odorous, mild erythema, right transmetatarsal amputation, poor pedal pulses.  L foot with no wounds, decreased pedal pulses.  Bilateral venous statis discoloration.  1+ bilateral LE edema to mid shin. NEUROLOGIC:  Alert and oriented PSYCHIATRIC:  Normal affect   ASSESSMENT AND PLAN    1. PAD (peripheral artery disease)/ Critical limb ischemia with DM/vascular ulcer on R foot - Open ulcer on ball of the R foot with mild erythema, no active drainage - Poor wound healing with poorly controlled DM and continued tobacco use.  Strongly encouraged tobacco cessation and following up with PCP/endocrine for DM management.  Follow-up with wound clinic. - Known occluded  R SFA - reviewed prev imaging with Dr. Gwenlyn Found.   - Pt seen with Dr. Gwenlyn Found today --> will schedule LE angiogram to assess opportunities to improve blood flow to open wound.  Pt is at risk for wound infection/amputation.  Pt agrees to proceed with LE angiogram.  - Hold Eliquis 2 days prior to procedure.   - Continue atorvastatin. - Has carotid duplex scheduled for later this month.  2. Mixed hyperlipidemia, well controlled. - LDL 59 in 02/2021, continue atorvastatin.  3. Tobacco use - Cessation encouraged for wound healing and to prevent further vascular disease.  4.  DM, poorly controlled.   - A1C 9.7%.   - Recommend f/u with PCP/endocrine to achieve A1C <7%.  5.  Chronic systolic/diastolic CHF, relatively euvolemic.  - Hospitalized 6/20-22 for CHF.  Losartan held with AKI. - Check BMET today to see if able to restart losartan.      Disposition: LE angio in 2 weeks with Dr. Gwenlyn Found.  Otherwise, follow-up with CHF clinic as scheduled in August.  Shared Decision Making/Informed Consent The risks [stroke (1 in 1000), death (1 in 45), kidney failure [usually temporary] (1 in 500),  bleeding (1 in 200), allergic reaction [possibly serious] (1 in 200)], benefits (diagnostic support and management of coronary artery disease) and alternatives of a LE angiogram were discussed in detail with Ms. Garnet and she is willing to proceed.    Medication Adjustments/Labs and Tests Ordered: Current medicines are reviewed at length with the patient today.  Concerns regarding medicines are outlined above.  Orders Placed This Encounter  Procedures   CBC   Basic metabolic panel   EKG [id number]   VAS [place] ABI WITH/WO TBI   VAS [place] LOWER EXTREMITY ARTERIAL DUPLEX   No orders of the defined types were placed in this encounter.   Patient Instructions  Medication Instructions:  No Changes In Medications at this time. PLEASE FOLLOW MEDICATION INSTRUCTION GIVEN FOR PV PROCEDURE *If you need a refill on your cardiac medications before your next appointment, please call your pharmacy*  Lab Work: BMET AND CBC July 11th- NO APPOINTMENT NEEDED  If you have labs (blood work) drawn today and your tests are completely normal, you will receive your results only by: [place] (if you have MyChart) OR A paper copy in the mail If you have any lab test that is abnormal or we need to change your treatment, we will call you to review the results.  Testing/Procedures: Your physician has requested that you have a peripheral vascular angiogram. This exam is performed at the hospital. During this exam IV contrast is used to look at arterial blood flow. Please review the information sheet given for details.  Your physician has requested that you have a lower extremity arterial duplex ONE WEEK POST PROCEDURE. During this test, ultrasound is used to evaluate arterial blood flow in the legs. Allow one hour for this exam. There are no restrictions or special instructions. This will take place at [place], [address].   Your physician has requested that you have an ankle brachial index (ABI) ONE WEEK  POST PROCEDURE. During this test an ultrasound and blood pressure cuff are used to evaluate the arteries that supply the arms and legs with blood. Allow thirty minutes for this exam. There are no restrictions or special instructions. This will take place at [place], [address].   Follow-Up: At [facility], you and your health needs are our priority.  As part of our  continuing mission to provide you with exceptional heart care, we have created designated Provider Care Teams.  These Care Teams include your primary Cardiologist (physician) and Advanced Practice Providers (APPs -  Physician Assistants and Nurse Practitioners) who all work together to provide you with the care you need, when you need it.   Your next appointment:   2 week(s) POST PROCEDURE  The format for your next appointment:   In Person  Provider:   Quay Burow, MD  Other Instructions    [place] [address] Dept: [phone] Loc: [place]  05/19/2021  You are scheduled for a Peripheral Angiogram on Monday, July 18 with Dr. Quay Burow.  1. Please arrive at the [facility] (Main Entrance A) at [facility]: [address] [place], [place] [address] at 6:00 AM FOR PRE HYDRATION. Free valet parking service is available.   Special note: Every effort is made to have your procedure done on time. Please understand that emergencies sometimes delay scheduled procedures.  2. Diet: Do not eat solid foods after midnight.  The patient may have clear liquids until 5am upon the day of the procedure.  3. Labs: You will need to have blood drawn on Monday, July 11 at [place]  Open: 8am - 5pm (Lunch 12:30 - 1:30)   Phone: [phone]. You do not need to be fasting.  4. Medication instructions in preparation for your procedure:    Contrast Allergy: No  Stop taking Eliquis (Apixiban) on Saturday, July 16.  Take only 50 units of insulin the night before your procedure. Do not take any insulin on the day of the procedure. (LANTUS)  DO NOT TAKE DIABETIC MEDICATIONS ON THE MORNING OF PROCEDURE DO NOT TAKE JARDIANCE THE MORNING OF PROCEDURE DO NOT TAKE TORSEMIDE OR SPIRONOLACTONE THE MORNING OF PROCEDURE  On the morning of your procedure, take your Aspirin '81mg'$  and any morning medicines NOT listed above.  You may use sips of water.  5. Plan for one night stay--bring personal belongings. 6. Bring a current list of your medications and current insurance cards. 7. You MUST have a responsible person to drive you home. 8. Someone MUST be with you the first 24 hours after you arrive home or your discharge will be delayed. 9. Please wear clothes that are easy to get on and off and wear slip-on shoes.  Thank you for allowing [place] to care for you!   -- [facility] Invasive Cardiovascular services    Signed, Gaston Islam  05/19/2021 1:27 PM    [facility]

## 2021-05-18 NOTE — Progress Notes (Signed)
Patient in the office today being seen by EJ with [facility]. Patient tried on diabetic shoes and was satisfied with the fit. Patient was educated on the break-in process at that this time. Advised patient to call the office with any questions, comments, or concerns. Patient verbalized understanding.

## 2021-05-19 ENCOUNTER — Other Ambulatory Visit: Payer: Self-pay

## 2021-05-19 ENCOUNTER — Encounter: Payer: Self-pay | Admitting: Medical

## 2021-05-19 ENCOUNTER — Ambulatory Visit (INDEPENDENT_AMBULATORY_CARE_PROVIDER_SITE_OTHER): Payer: Medicare (Managed Care) | Admitting: Physician Assistant

## 2021-05-19 VITALS — BP 130/60 | HR 61 | Ht 64.0 in | Wt 222.0 lb

## 2021-05-19 DIAGNOSIS — E782 Mixed hyperlipidemia: Secondary | ICD-10-CM | POA: Diagnosis not present

## 2021-05-19 DIAGNOSIS — I130 Hypertensive heart and chronic kidney disease with heart failure and stage 1 through stage 4 chronic kidney disease, or unspecified chronic kidney disease: Secondary | ICD-10-CM

## 2021-05-19 DIAGNOSIS — I5042 Chronic combined systolic (congestive) and diastolic (congestive) heart failure: Secondary | ICD-10-CM

## 2021-05-19 DIAGNOSIS — E1151 Type 2 diabetes mellitus with diabetic peripheral angiopathy without gangrene: Secondary | ICD-10-CM

## 2021-05-19 DIAGNOSIS — E1122 Type 2 diabetes mellitus with diabetic chronic kidney disease: Secondary | ICD-10-CM

## 2021-05-19 DIAGNOSIS — Z720 Tobacco use: Secondary | ICD-10-CM

## 2021-05-19 DIAGNOSIS — I739 Peripheral vascular disease, unspecified: Secondary | ICD-10-CM | POA: Diagnosis not present

## 2021-05-19 DIAGNOSIS — N183 Chronic kidney disease, stage 3 unspecified: Secondary | ICD-10-CM

## 2021-05-19 DIAGNOSIS — I70229 Atherosclerosis of native arteries of extremities with rest pain, unspecified extremity: Secondary | ICD-10-CM | POA: Diagnosis not present

## 2021-05-19 NOTE — Patient Instructions (Addendum)
Medication Instructions:  No Changes In Medications at this time. PLEASE FOLLOW MEDICATION INSTRUCTION GIVEN FOR PV PROCEDURE *If you need a refill on your cardiac medications before your next appointment, please call your pharmacy*  Lab Work: BMET AND CBC July 11th- NO APPOINTMENT NEEDED  If you have labs (blood work) drawn today and your tests are completely normal, you will receive your results only by: [place] (if you have MyChart) OR A paper copy in the mail If you have any lab test that is abnormal or we need to change your treatment, we will call you to review the results.  Testing/Procedures: Your physician has requested that you have a peripheral vascular angiogram. This exam is performed at the hospital. During this exam IV contrast is used to look at arterial blood flow. Please review the information sheet given for details.  Your physician has requested that you have a lower extremity arterial duplex ONE WEEK POST PROCEDURE. During this test, ultrasound is used to evaluate arterial blood flow in the legs. Allow one hour for this exam. There are no restrictions or special instructions. This will take place at [place], [address].   Your physician has requested that you have an ankle brachial index (ABI) ONE WEEK POST PROCEDURE. During this test an ultrasound and blood pressure cuff are used to evaluate the arteries that supply the arms and legs with blood. Allow thirty minutes for this exam. There are no restrictions or special instructions. This will take place at [place], [address].   Follow-Up: At [facility], you and your health needs are our priority.  As part of our continuing mission to provide you with exceptional heart care, we have created designated Provider Care Teams.  These Care Teams include your primary Cardiologist (physician) and Advanced Practice Providers (APPs -  Physician Assistants and Nurse Practitioners) who all work together to  provide you with the care you need, when you need it.   Your next appointment:   2 week(s) POST PROCEDURE  The format for your next appointment:   In Person  Provider:   Quay Burow, MD  Other Instructions    [place] [address] Dept: [phone] Loc: [place]  05/19/2021  You are scheduled for a Peripheral Angiogram on Monday, July 18 with Dr. Quay Burow.  1. Please arrive at the [facility] (Main Entrance A) at [facility]: [address] [place], [place] [address] at 6:00 AM FOR PRE HYDRATION. Free valet parking service is available.   Special note: Every effort is made to have your procedure done on time. Please understand that emergencies sometimes delay scheduled procedures.  2. Diet: Do not eat solid foods after midnight.  The patient may have clear liquids until 5am upon the day of the procedure.  3. Labs: You will need to have blood drawn on Monday, July 11 at [place]  Open: 8am - 5pm (Lunch 12:30 - 1:30)   Phone: [phone]. You do not need to be fasting.  4. Medication instructions in preparation for your procedure:   Contrast Allergy: No  Stop taking Eliquis (Apixiban) on Saturday, July 16.  Take only 50 units of insulin the night before your procedure. Do not take any insulin on the day of the procedure. (LANTUS)  DO NOT TAKE DIABETIC MEDICATIONS ON THE MORNING OF PROCEDURE DO NOT  TAKE JARDIANCE THE MORNING OF PROCEDURE DO NOT TAKE TORSEMIDE OR SPIRONOLACTONE THE MORNING OF PROCEDURE  On the morning of your procedure, take your Aspirin '81mg'$  and any morning medicines NOT listed above.  You may use sips of water.  5. Plan for one night stay--bring personal belongings. 6. Bring a current list of your medications and current insurance cards. 7. You MUST have a  responsible person to drive you home. 8. Someone MUST be with you the first 24 hours after you arrive home or your discharge will be delayed. 9. Please wear clothes that are easy to get on and off and wear slip-on shoes.  Thank you for allowing [place] to care for you!   -- [facility]

## 2021-05-20 ENCOUNTER — Other Ambulatory Visit (HOSPITAL_COMMUNITY): Payer: Self-pay

## 2021-05-20 NOTE — Progress Notes (Addendum)
Paramedicine Encounter    Patient ID: [name], female    DOB: 01-23-1961, 60 y.o.   MRN: [id number]   Patient Care Team: [facility] as PCP - General Gwenlyn Found, [name], MD as PCP - Cardiology (Cardiology) [name], MD as PCP - Electrophysiology (Cardiology) [name], MD as Consulting Physician (Cardiology) [name], MD as Consulting Physician (Pulmonary Disease) System, Provider Not In  Patient Active Problem List   Diagnosis Date Noted  . Fall 05/05/2021  . PAF (paroxysmal atrial fibrillation) ([place]) 05/05/2021  . COPD (chronic obstructive pulmonary disease) ([place]) 05/05/2021  . DM (diabetes mellitus), type 2 with complications ([place]) 99991111  . PAD (peripheral artery disease) ([place]) 05/05/2021  . Cellulitis 05/05/2021  . Acute on chronic combined systolic and diastolic CHF (congestive heart failure) ([place]) 05/05/2021  . OSA (obstructive sleep apnea) 05/05/2021  . CKD (chronic kidney disease) stage 3, GFR 30-59 ml/min ([name]) 05/05/2021  . AKI (acute kidney injury) ([place]) 05/04/2021  . Pressure injury of skin 05/04/2021  . Ulcer of foot, unspecified laterality, limited to breakdown of skin ([place]) 02/17/2021  . Altered mental status 01/02/2021  . AF (paroxysmal atrial fibrillation) ([place]) 01/02/2021  . CAD (coronary artery disease) 01/02/2021  . PVD (peripheral vascular disease) ([place]) 01/02/2021  . Acute renal failure superimposed on stage 4 chronic kidney disease ([place]) 01/02/2021  . Diabetes mellitus type 2, uncontrolled, with complications ([place]) [id number]  . Diabetic nephropathy ([place]) 01/02/2021  . Low back pain 06/01/2020  . Hyperlipidemia 04/03/2020  . Muscle weakness 03/20/2020  . Closed fracture of neck of right radius 03/13/2020  . Pain in elbow 03/12/2020  . PAD (peripheral artery disease) ([place]) 12/26/2019  . Acute renal failure ([place])   . Acute respiratory failure with hypoxia ([place]) 12/02/2019  . Acute exacerbation of CHF  (congestive heart failure) ([place]) 12/01/2019  . Cellulitis in diabetic foot ([place]) 07/08/2019  . Osteomyelitis ([place]) 06/21/2019  . NICM (nonischemic cardiomyopathy) ([place]) 06/20/2019  . Non-healing ulcer ([place]) 06/20/2019  . Acute CHF (congestive heart failure) ([place]) 11/26/2018  . CHF (congestive heart failure) ([place]) 11/26/2018  . Acute respiratory failure ([place]) 10/21/2018  . AKI (acute kidney injury) ([place]) 08/18/2018  . Coagulation disorder ([place]) 08/09/2017  . Depression 07/21/2017  . At risk for adverse drug reaction 06/20/2017  . Peripheral neuropathy 06/20/2017  . Acute osteomyelitis of right foot ([place]) 06/13/2017  . S/P transmetatarsal amputation of foot, right ([place]) 06/05/2017  . 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[place])   . SIRS (systemic inflammatory response syndrome) ([place]) 04/06/2017  . CKD (chronic kidney disease), stage III ([place]) 11/24/2016  . Suspected sleep apnea 11/24/2016  . Ulcer of toe of right foot, with necrosis of bone ([place]) 10/27/2016  . Ulcer of left lower leg ([place]) 05/19/2016  . Severe obesity (BMI >= 40) ([place]) 02/24/2016  . COPD GOLD 0  02/24/2016  . Morbid obesity ([place]) 02/24/2016  . Encounter for therapeutic drug monitoring 02/10/2016  . Symptomatic bradycardia 01/12/2016  . Essential hypertension 12/22/2015  . Chronic combined systolic and diastolic CHF (congestive heart failure) ([place])   . Wheeze   . 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 Tobacco abuse 10/23/2015  . Coronary artery disease   . DOE (dyspnea on exertion) 04/29/2015  . Diabetes mellitus type 2, uncontrolled ([place]) 02/08/2015  . Influenza A 02/07/2015  . Wrist fracture, left, with routine healing, subsequent encounter 02/05/2015  . Wrist  fracture, left, closed, initial encounter 01/29/2015  . PAF (paroxysmal atrial fibrillation) ([employer]) 01/16/2015  . Carotid arterial disease ([place])  01/16/2015  . Claudication ([place]) 01/15/2015  . Demand ischemia ([place]) 10/29/2014  . Insomnia 02/03/2014  . COPD with acute exacerbation ([place]) 02/01/2014  . S/P peripheral artery angioplasty - [place]boHawk atherectomy; R SFA 09/11/2013    Class: Acute  . Leg pain, bilateral 08/19/2013  . Hypothyroidism 07/31/2013  . Cellulitis 06/13/2013  . History of cocaine abuse ([place]) 06/13/2013  . Long term current use of anticoagulant therapy 05/20/2013  . Alcohol abuse   . Narcotic abuse ([place])   . Marijuana abuse   . Alcoholic cirrhosis ([place])   . DM (diabetes mellitus), type 2 with peripheral vascular complications ([name])     Current Outpatient Medications:  .  ACCU-CHEK GUIDE test strip, USE TO CHECK BLOOD SUGAR FOUR TIMES DAILY, Disp: , Rfl:  .  albuterol (VENTOLIN HFA) 108 (90 Base) MCG/ACT inhaler, Inhale 2 puffs into the lungs every 6 (six) hours as needed for wheezing or shortness of breath., Disp: 18 g, Rfl: 0 .  albuterol (VENTOLIN HFA) 108 (90 Base) MCG/ACT inhaler, Inhale 2 puffs into the lungs every 6 (six) hours as needed for wheezing or shortness of breath., Disp: , Rfl:  .  amiodarone (PACERONE) 200 MG tablet, Take 0.5 tablets (100 mg total) by mouth daily., Disp: 45 tablet, Rfl: 3 .  amLODipine (NORVASC) 10 MG tablet, Take 1 tablet (10 mg total) by mouth daily., Disp: 90 tablet, Rfl: 3 .  apixaban (ELIQUIS) 5 MG TABS tablet, Take 5 mg by mouth 2 (two) times daily., Disp: , Rfl:  .  Ascorbic Acid (VITAMIN C PO), Take 1 tablet by mouth daily., Disp: , Rfl:  .  atorvastatin (LIPITOR) 40 MG tablet, Take 40 mg by mouth every evening., Disp: , Rfl:  .  bisoprolol (ZEBETA) 5 MG tablet, Take 1 tablet (5 mg total) by mouth daily., Disp: 90 tablet, Rfl: 3 .  budesonide-formoterol (SYMBICORT) 160-4.5 MCG/ACT inhaler, Inhale 2 puffs into the lungs 2 (two) times daily., Disp: 1 Inhaler, Rfl: 2 .  colchicine 0.6 MG tablet, Take 0.6 mg by mouth daily., Disp: , Rfl:  .  Dulaglutide (TRULICITY [place]),  Inject [id number] mg into the skin once a week., Disp: , Rfl:  .  empagliflozin (JARDIANCE) 10 MG TABS tablet, Take 1 tablet (10 mg total) by mouth daily before breakfast., Disp: 90 tablet, Rfl: 3 .  FLUoxetine (PROZAC) 10 MG capsule, Take 1 capsule (10 mg total) by mouth at bedtime., Disp: 30 capsule, Rfl: 0 .  folic acid (FOLVITE) 1 MG tablet, Take 1 mg by mouth daily., Disp: , Rfl:  .  HUMALOG KWIKPEN 200 UNIT/ML KwikPen, Inject 8 Units into the skin in the morning, at noon, and at bedtime., Disp: , Rfl:  .  hydrALAZINE (APRESOLINE) 25 MG tablet, Take 1 tablet (25 mg total) by mouth 3 (three) times daily., Disp: 90 tablet, Rfl: 11 .  insulin aspart (NOVOLOG) 100 UNIT/ML FlexPen, INJECT 8 UNITS INTO THE SKIN THREE TIMES DAILY WITH MEALS., Disp: 15 mL, Rfl: 0 .  insulin glargine (LANTUS) 100 UNIT/ML Solostar Pen, INJECT 20 UNITS INTO THE SKIN DAILY., Disp: 15 mL, Rfl: 0 .  insulin lispro (HUMALOG) 100 UNIT/ML KwikPen, Inject 8 Units into the skin 3 (three) times daily with meals., Disp: 15 mL, Rfl: 0 .  Insulin Pen Needle 32G X 4 MM MISC, USE AS DIRECTED WITH INSULIN PENS, Disp: 100 each, Rfl: 0 .  levothyroxine (SYNTHROID, LEVOTHROID) 50 MCG tablet, Take 1 tablet (50 mcg total) by mouth daily., Disp: 30 tablet, Rfl: 0 .  Multiple Vitamins-Minerals (VITAMIN D3 COMPLETE PO), Take 1 capsule by mouth daily in the afternoon., Disp: , Rfl:  .  nortriptyline (PAMELOR) 50 MG capsule, Take 50 mg by mouth 2 (two) times daily., Disp: , Rfl:  .  omega-3 fish oil (MAXEPA) 1000 MG CAPS capsule, Take 1 capsule by mouth daily., Disp: , Rfl:  .  pantoprazole (PROTONIX) 40 MG tablet, Take 1 tablet (40 mg total) by mouth daily., Disp:  , Rfl:  .  spironolactone (ALDACTONE) 25 MG tablet, Take 1 tablet by mouth daily., Disp: , Rfl:  .  torsemide (DEMADEX) 20 MG tablet, Take 4 tablets (80 mg total) by mouth every morning AND 3 tablets (60 mg total) every evening. (Patient taking differently: Take 4 tablets (80 mg total) by  mouth every morning AND 4 tablets (80 mg total) every evening.), Disp: 210 tablet, Rfl: 11 .  TRULICITY 1.5 0000000 SOPN, Inject 1.5 mg into the skin once a week. Every Friday, Disp: , Rfl:  .  VITAMIN D PO, Take 1 tablet by mouth daily., Disp: , Rfl:  .  allopurinol (ZYLOPRIM) 100 MG tablet, Take 100 mg by mouth daily., Disp: , Rfl:  .  cyclobenzaprine (FLEXERIL) 10 MG tablet, Take 1 tablet by mouth at bedtime. (Patient not taking: Reported on 05/20/2021), Disp: , Rfl:  .  losartan (COZAAR) 100 MG tablet, Take 1 tablet (100 mg total) by mouth daily. (Patient not taking: Reported on 05/20/2021), Disp: 90 tablet, Rfl: 3 .  methocarbamol (ROBAXIN) 500 MG tablet, Take 1 tablet (500 mg total) by mouth every 8 (eight) hours as needed for muscle spasms. (Patient not taking: Reported on 05/20/2021), Disp: 30 tablet, Rfl: 0 Allergies  Allergen Reactions  . Gabapentin Nausea And Vomiting and Other (See Comments)    POSSIBLE SHAKING? TAKING MED CURRENTLY  . Gabapentin   . Lyrica [Pregabalin] Other (See Comments)    Shaking Pt still taking lyrica--"probably will stop taking"       Social History   Socioeconomic History  . Marital status: Single    Spouse name: Not on file  . Number of children: 1  . Years of education: 27  . Highest education level: 12th grade  Occupational History  . Occupation: disabled  Tobacco Use  . Smoking status: Every Day    Packs/day: 1.00    Years: 44.00    Pack years: 44.00    Types: E-cigarettes, Cigarettes  . Smokeless tobacco: Former    Types: Snuff  Vaping Use  . Vaping Use: Former  . Devices: 11/26/2018 "stopped months ago"  Substance and Sexual Activity  . Alcohol use: Yes    Comment: occ  . Drug use: Yes    Types: "Crack" cocaine, Marijuana, Oxycodone  . Sexual activity: Not Currently  Other Topics Concern  . Not on file  Social History Narrative   ** Merged History Encounter **       Lives in [place], in motel with sister.  They are looking to move  but don't have a place to go yet.     Social Determinants of Health   Financial Resource Strain: Low Risk   . Difficulty of Paying Living Expenses: Not very hard  Food Insecurity: Not on file  Transportation Needs: Not on file  Physical Activity: Not on file  Stress: Not on file  Social Connections: Not on file  Intimate  Partner Violence: Not on file    Physical Exam      Future Appointments  Date Time Provider [place]  05/31/2021 12:30 PM Ricard Dillon, MD [facility]  06/02/2021  1:00 PM [facility]-CV NL VASC 4 [facility]  06/08/2021  1:00 PM MC-CV NL VASC 2 [facility]  06/25/2021  9:00 AM [name], MD [facility]  07/13/2021  2:30 PM [facility] PA/NP [facility] None    BP 128/70   Pulse 60   Resp 18   Wt 221 lb (100.2 kg)   LMP  (LMP Unknown)   SpO2 97%   BMI 37.93 kg/m  Weight yesterday-221 Last visit weight-225  Pt reports she is doing ok, just her continuous foot pain, she went to see her PCP last week, she is sch for angiogram on her LE for 7/18. Will make those med adjustments next week for the stopping of eliquis 2 days prior to procedure.  CBG's still elevated.  Meds verified and pill box refilled.  She did miss one dose of her PM meds.  She reports laying on her cardiomems each day except for one.  Lungs have wheezing, she reports using her neb and her inhaler already today.    Refills needed- Allopurinol Trulicity     Marylouise Stacks, [place] Paramedic  05/20/21

## 2021-05-26 LAB — CBC
Hematocrit: 35.5 % (ref 34.0–46.6)
Hemoglobin: 10.5 g/dL — ABNORMAL LOW (ref 11.1–15.9)
MCH: 26.1 pg — ABNORMAL LOW (ref 26.6–33.0)
MCHC: 29.6 g/dL — ABNORMAL LOW (ref 31.5–35.7)
MCV: 88 fL (ref 79–97)
Platelets: 368 10*3/uL (ref 150–450)
RBC: 4.02 x10E6/uL (ref 3.77–5.28)
RDW: 14.1 % (ref 11.7–15.4)
WBC: 7.7 10*3/uL (ref 3.4–10.8)

## 2021-05-26 LAB — BASIC METABOLIC PANEL
BUN/Creatinine Ratio: 20 (ref 9–23)
BUN: 36 mg/dL — ABNORMAL HIGH (ref 6–24)
CO2: 25 mmol/L (ref 20–29)
Calcium: 8.8 mg/dL (ref 8.7–10.2)
Chloride: 100 mmol/L (ref 96–106)
Creatinine, Ser: 1.82 mg/dL — ABNORMAL HIGH (ref 0.57–1.00)
Glucose: 306 mg/dL — ABNORMAL HIGH (ref 65–99)
Potassium: 5 mmol/L (ref 3.5–5.2)
Sodium: 146 mmol/L — ABNORMAL HIGH (ref 134–144)
eGFR: 32 mL/min/{1.73_m2} — ABNORMAL LOW (ref >59)

## 2021-05-27 ENCOUNTER — Other Ambulatory Visit (HOSPITAL_COMMUNITY): Payer: Self-pay

## 2021-05-27 ENCOUNTER — Telehealth: Payer: Self-pay | Admitting: *Deleted

## 2021-05-27 ENCOUNTER — Other Ambulatory Visit (HOSPITAL_COMMUNITY): Payer: Self-pay | Admitting: *Deleted

## 2021-05-27 DIAGNOSIS — Z01812 Encounter for preprocedural laboratory examination: Secondary | ICD-10-CM

## 2021-05-27 DIAGNOSIS — I739 Peripheral vascular disease, unspecified: Secondary | ICD-10-CM

## 2021-05-27 MED ORDER — SPIRONOLACTONE 25 MG PO TABS: 25.0000 mg | ORAL_TABLET | Freq: Every day | ORAL | 11 refills | Status: DC

## 2021-05-27 NOTE — Telephone Encounter (Addendum)
Call placed to patient to review procedure instructions for Monday May 31, 2021. Patient states she will not have transportation home or responsible adult to be with her after procedure on July 18 and asked me to reschedule to the following week.  Abdominal aortogram with lower extremity has been rescheduled to Monday June 07, 2021 9:30 AM/arrive 5:30 AM for hydration prior to procedure. Reviewed procedure/mask/visitor instructions with patient and her sister.  Pt contacted pre-abdominal aortogram scheduled at [facility] for: Monday June 07, 2021 9:30 AM Verified arrival time and place: [place] [facility]) at: 5:30 AM-pre-procedure hydration   No solid food after midnight prior to cath, clear liquids until 5 AM day of procedure.  Hold: Eliquis-none 06/05/21 until post procedure Jardiance-AM of procedure Insulin-AM of procedure/1/2 usual Insulin HS prior to procedure Spironolactone-day before and day of procedure-GFR 32 Lasix-day before and day of procedure-GFR 32  Except hold medications AM meds can be taken pre-cath with sips of water including: aspirin 81 mg   Confirmed patient has responsible adult to drive home post procedure and be with patient first 24 hours after arriving home: yes  You are allowed ONE visitor in the waiting room during the time you are at the hospital for your procedure. Both you and your visitor must wear a mask once you enter the hospital.   Patient reports does not currently have any symptoms concerning for COVID-19 and no household members with COVID-19 like illness.    Will forward to Dr Gwenlyn Found for review: 05/25/21 eGFR 32/Cr 1.82  -per cath lab guideline of GFR <60  instructed patient to hold torsemide and spironolactone day before and day of procedure. -will plan standard weight based IV flow rate orders for hydration prior to procedure>3 ml/kg hr for one hour, then 1 ml/kg/hr continuous.

## 2021-05-27 NOTE — Progress Notes (Signed)
Paramedicine Encounter    Patient ID: [name], female    DOB: November 17, 1960, 60 y.o.   MRN: [id number]   Patient Care Team: [name] as PCP - General Gwenlyn Found, [name], MD as PCP - Cardiology (Cardiology) [name], MD as PCP - Electrophysiology (Cardiology) [name], MD as Consulting Physician (Cardiology) [name], MD as Consulting Physician (Pulmonary Disease) System, Provider Not In  Patient Active Problem List   Diagnosis Date Noted  . Fall 05/05/2021  . PAF (paroxysmal atrial fibrillation) ([name]) 05/05/2021  . COPD (chronic obstructive pulmonary disease) ([name]) 05/05/2021  . DM (diabetes mellitus), type 2 with complications ([name]) 99991111  . PAD (peripheral artery disease) ([name]) 05/05/2021  . Cellulitis 05/05/2021  . Acute on chronic combined systolic and diastolic CHF (congestive heart failure) ([name]) 05/05/2021  . OSA (obstructive sleep apnea) 05/05/2021  . CKD (chronic kidney disease) stage 3, GFR 30-59 ml/min ([name]) 05/05/2021  . AKI (acute kidney injury) ([name]) 05/04/2021  . Pressure injury of skin 05/04/2021  . Ulcer of foot, unspecified laterality, limited to breakdown of skin ([name]) 02/17/2021  . Altered mental status 01/02/2021  . AF (paroxysmal atrial fibrillation) ([name]) 01/02/2021  . CAD (coronary artery disease) 01/02/2021  . PVD (peripheral vascular disease) ([name]) 01/02/2021  . Acute renal failure superimposed on stage 4 chronic kidney disease ([name]) 01/02/2021  . Diabetes mellitus type 2, uncontrolled, with complications ([name]) [id number]  . Diabetic nephropathy ([name]) 01/02/2021  . Low back pain 06/01/2020  . Hyperlipidemia 04/03/2020  . Muscle weakness 03/20/2020  . Closed fracture of neck of right radius 03/13/2020  . Pain in elbow 03/12/2020  . PAD (peripheral artery disease) ([name]) 12/26/2019  . Acute renal failure ([name])   . Acute respiratory failure with hypoxia ([name]) 12/02/2019  . Acute exacerbation of CHF  (congestive heart failure) ([name]) 12/01/2019  . Cellulitis in diabetic foot ([name]) 07/08/2019  . Osteomyelitis ([name]) 06/21/2019  . NICM (nonischemic cardiomyopathy) ([name]) 06/20/2019  . Non-healing ulcer ([name]) 06/20/2019  . Acute CHF (congestive heart failure) ([name]) 11/26/2018  . CHF (congestive heart failure) ([name]) 11/26/2018  . Acute respiratory failure ([name]) 10/21/2018  . AKI (acute kidney injury) ([name]) 08/18/2018  . Coagulation disorder ([name]) 08/09/2017  . Depression 07/21/2017  . At risk for adverse drug reaction 06/20/2017  . Peripheral neuropathy 06/20/2017  . Acute osteomyelitis of right foot ([place]) 06/13/2017  . S/P transmetatarsal amputation of foot, right ([place]) 06/05/2017  . 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[place])   . SIRS (systemic inflammatory response syndrome) ([place]) 04/06/2017  . CKD (chronic kidney disease), stage III ([place]) 11/24/2016  . Suspected sleep apnea 11/24/2016  . Ulcer of toe of right foot, with necrosis of bone ([place]) 10/27/2016  . Ulcer of left lower leg ([place]) 05/19/2016  . Severe obesity (BMI >= 40) ([place]) 02/24/2016  . COPD GOLD 0  02/24/2016  . Morbid obesity ([place]) 02/24/2016  . Encounter for therapeutic drug monitoring 02/10/2016  . Symptomatic bradycardia 01/12/2016  . Essential hypertension 12/22/2015  . Chronic combined systolic and diastolic CHF (congestive heart failure) ([place])   . Wheeze   . 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 Tobacco abuse 10/23/2015  . Coronary artery disease   . DOE (dyspnea on exertion) 04/29/2015  . Diabetes mellitus type 2, uncontrolled ([place]) 02/08/2015  . Influenza A 02/07/2015  . Wrist fracture, left, with routine healing, subsequent encounter 02/05/2015  . Wrist  fracture, left, closed, initial encounter 01/29/2015  . PAF (paroxysmal atrial fibrillation) ([place]) 01/16/2015  . Carotid arterial disease ([name])  01/16/2015  . Claudication ([name]) 01/15/2015  . Demand ischemia ([name]) 10/29/2014  . Insomnia 02/03/2014  . COPD with acute exacerbation ([name]) 02/01/2014  . S/P peripheral artery angioplasty - TurboHawk atherectomy; R SFA 09/11/2013    Class: Acute  . Leg pain, bilateral 08/19/2013  . Hypothyroidism 07/31/2013  . Cellulitis 06/13/2013  . History of cocaine abuse ([name]) 06/13/2013  . Long term current use of anticoagulant therapy 05/20/2013  . Alcohol abuse   . Narcotic abuse ([name])   . Marijuana abuse   . Alcoholic cirrhosis ([name])   . DM (diabetes mellitus), type 2 with peripheral vascular complications ([name])     Current Outpatient Medications:  .  ACCU-CHEK GUIDE test strip, USE TO CHECK BLOOD SUGAR FOUR TIMES DAILY, Disp: , Rfl:  .  acetaminophen (TYLENOL) 500 MG tablet, Take 500 mg by mouth every 6 (six) hours as needed for moderate pain., Disp: , Rfl:  .  albuterol (VENTOLIN HFA) 108 (90 Base) MCG/ACT inhaler, Inhale 2 puffs into the lungs every 6 (six) hours as needed for wheezing or shortness of breath., Disp: 18 g, Rfl: 0 .  amiodarone (PACERONE) 200 MG tablet, Take 0.5 tablets (100 mg total) by mouth daily., Disp: 45 tablet, Rfl: 3 .  amLODipine (NORVASC) 10 MG tablet, Take 1 tablet (10 mg total) by mouth daily., Disp: 90 tablet, Rfl: 3 .  apixaban (ELIQUIS) 5 MG TABS tablet, Take 5 mg by mouth 2 (two) times daily., Disp: , Rfl:  .  atorvastatin (LIPITOR) 40 MG tablet, Take 40 mg by mouth every evening., Disp: , Rfl:  .  bisoprolol (ZEBETA) 5 MG tablet, Take 1 tablet (5 mg total) by mouth daily., Disp: 90 tablet, Rfl: 3 .  budesonide-formoterol (SYMBICORT) 160-4.5 MCG/ACT inhaler, Inhale 2 puffs into the lungs 2 (two) times daily., Disp: 1 Inhaler, Rfl: 2 .  colchicine 0.6 MG tablet, Take 0.6 mg by mouth daily., Disp: , Rfl:  .  Dulaglutide (TRULICITY) [id number] [id number] SOPN, Inject 0.75 mg into the skin every Friday., Disp: , Rfl:  .  empagliflozin (JARDIANCE) 10 MG TABS tablet, Take  1 tablet (10 mg total) by mouth daily before breakfast., Disp: 90 tablet, Rfl: 3 .  FLUoxetine (PROZAC) 10 MG capsule, Take 1 capsule (10 mg total) by mouth at bedtime., Disp: 30 capsule, Rfl: 0 .  folic acid (FOLVITE) 1 MG tablet, Take 1 mg by mouth daily., Disp: , Rfl:  .  hydrALAZINE (APRESOLINE) 25 MG tablet, Take 1 tablet (25 mg total) by mouth 3 (three) times daily., Disp: 90 tablet, Rfl: 11 .  insulin aspart (NOVOLOG) 100 UNIT/ML FlexPen, INJECT 8 UNITS INTO THE SKIN THREE TIMES DAILY WITH MEALS., Disp: 15 mL, Rfl: 0 .  insulin glargine (LANTUS) 100 UNIT/ML Solostar Pen, INJECT 20 UNITS INTO THE SKIN DAILY. (Patient taking differently: Inject 20 Units into the skin at bedtime.), Disp: 15 mL, Rfl: 0 .  insulin lispro (HUMALOG) 100 UNIT/ML KwikPen, Inject 8 Units into the skin 3 (three) times daily with meals., Disp: 15 mL, Rfl: 0 .  Insulin Pen Needle 32G X 4 MM MISC, USE AS DIRECTED WITH INSULIN PENS, Disp: 100 each, Rfl: 0 .  levothyroxine (SYNTHROID, LEVOTHROID) 50 MCG tablet, Take 1 tablet (50 mcg total) by mouth daily., Disp: 30 tablet, Rfl: 0 .  nortriptyline (PAMELOR) 50 MG capsule, Take 50 mg by mouth 2 (two) times daily.,  Disp: , Rfl:  .  Omega-3 Fatty Acids (FISH OIL PO), Take 1 capsule by mouth daily., Disp: , Rfl:  .  pantoprazole (PROTONIX) 40 MG tablet, Take 1 tablet (40 mg total) by mouth daily., Disp:  , Rfl:  .  spironolactone (ALDACTONE) 25 MG tablet, Take 25 mg by mouth daily., Disp: , Rfl:  .  torsemide (DEMADEX) 20 MG tablet, Take 4 tablets (80 mg total) by mouth every morning AND 3 tablets (60 mg total) every evening. (Patient taking differently: Take 80 mg in the morning and 80 mg in the evening), Disp: 210 tablet, Rfl: 11 .  VITAMIN D PO, Take 1 tablet by mouth daily., Disp: , Rfl:  .  Aspirin-Caffeine (BC FAST PAIN RELIEF PO), Take 1 packet by mouth daily as needed (pain). (Patient not taking: Reported on 05/27/2021), Disp: , Rfl:  .  losartan (COZAAR) 100 MG tablet,  Take 1 tablet (100 mg total) by mouth daily. (Patient not taking: No sig reported), Disp: 90 tablet, Rfl: 3 .  methocarbamol (ROBAXIN) 500 MG tablet, Take 1 tablet (500 mg total) by mouth every 8 (eight) hours as needed for muscle spasms. (Patient not taking: No sig reported), Disp: 30 tablet, Rfl: 0 Allergies  Allergen Reactions  . Gabapentin Nausea And Vomiting and Other (See Comments)    POSSIBLE SHAKING  . Lyrica [Pregabalin] Other (See Comments)    Shaking       Social History   Socioeconomic History  . Marital status: Single    Spouse name: Not on file  . Number of children: 1  . Years of education: 16  . Highest education level: 12th grade  Occupational History  . Occupation: disabled  Tobacco Use  . Smoking status: Every Day    Packs/day: 1.00    Years: 44.00    Pack years: 44.00    Types: E-cigarettes, Cigarettes  . Smokeless tobacco: Former    Types: Snuff  Vaping Use  . Vaping Use: Former  . Devices: 11/26/2018 "stopped months ago"  Substance and Sexual Activity  . Alcohol use: Yes    Comment: occ  . Drug use: Yes    Types: "Crack" cocaine, Marijuana, Oxycodone  . Sexual activity: Not Currently  Other Topics Concern  . Not on file  Social History Narrative   ** Merged History Encounter **       Lives in [place], in motel with sister.  They are looking to move but don't have a place to go yet.     Social Determinants of Health   Financial Resource Strain: Low Risk   . Difficulty of Paying Living Expenses: Not very hard  Food Insecurity: Not on file  Transportation Needs: Not on file  Physical Activity: Not on file  Stress: Not on file  Social Connections: Not on file  Intimate Partner Violence: Not on file    Physical Exam      Future Appointments  Date Time Provider [place]  05/31/2021 12:30 PM Ricard Dillon, MD [facility]  06/02/2021  1:00 PM [facility] VASC 4 [facility]  06/08/2021  1:00 PM [facility] VASC 2 [facility]   06/25/2021  9:00 AM [name], MD [facility]  07/13/2021  2:30 PM [facility] PA/NP [facility] None    BP 130/64   Pulse 60   Resp 18   Wt 226 lb (102.5 kg)   LMP  (LMP Unknown)   SpO2 96%   BMI 38.79 kg/m   Weight yesterday-226  Last visit weight-221   Pt reports feeling ok, she denies increased sob, she did miss 2 afternoon doses of her meds. She is up 5lbs from last week-but she did miss 2 doses of her torsemide.  No dizziness, no c/p.  She has some procedures coming-those med changes prior to procedure has been done in pill box.  I asked her sister to help her be sure she takes her meds on right day of week. I filled 2 weeks of pill boxes for her. And hose med changes were put in the 2nd week and showed the sister and pt which pill box to work out of first.    Marylouise Stacks, [place] Paramedic  05/27/21

## 2021-05-28 ENCOUNTER — Telehealth: Payer: Self-pay

## 2021-05-28 NOTE — Telephone Encounter (Signed)
   [name] DOB: Feb 10, 1961 MRN: [id number]   RIDER WAIVER AND RELEASE OF LIABILITY  For purposes of improving physical access to our facilities, [facility] is pleased to partner with third parties to provide [facility] patients or other authorized individuals the option of convenient, on-demand ground transportation services (the [place]") through use of the technology service that enables users to request on-demand ground transportation from independent third-party providers.  By opting to use and accept these [employer], I, the undersigned, hereby agree on behalf of myself, and on behalf of any minor child using the Government social research officer for whom I am the parent or legal guardian, as follows:  Government social research officer provided to me are provided by independent third-party transportation providers who are not [employer] or employees and who are unaffiliated with [employer]. [facility] is neither a transportation carrier nor a common or public carrier. [facility] has no control over the quality or safety of the transportation that occurs as a result of the [employer]. [facility] cannot guarantee that any third-party transportation provider will complete any arranged transportation service. [facility] makes no representation, warranty, or guarantee regarding the reliability, timeliness, quality, safety, suitability, or availability of any of the Transport Services or that they will be error free. I fully understand that traveling by vehicle involves risks and dangers of serious bodily injury, including permanent disability, paralysis, and death. I agree, on behalf of myself and on behalf of any minor child using the Transport Services for whom I am the parent or legal guardian, that the entire risk arising out of my use of the [employer] remains solely with me, to the maximum extent permitted under applicable law. The [employer] are provided "as  is" and "as available." [facility] disclaims all representations and warranties, express, implied or statutory, not expressly set out in these terms, including the implied warranties of merchantability and fitness for a particular purpose. I hereby waive and release [place], its agents, employees, officers, directors, representatives, insurers, attorneys, assigns, successors, subsidiaries, and affiliates from any and all past, present, or future claims, demands, liabilities, actions, causes of action, or suits of any kind directly or indirectly arising from acceptance and use of the [employer]. I further waive and release  and its affiliates from all present and future liability and responsibility for any injury or death to persons or damages to property caused by or related to the use of the [employer]. I have read this Waiver and Release of Liability, and I understand the terms used in it and their legal significance. This Waiver is freely and voluntarily given with the understanding that my right (as well as the right of any minor child for whom I am the parent or legal guardian using the [employer]) to legal recourse against  in connection with the [employer] is knowingly surrendered in return for use of these services.   I attest that I read the consent document to [name], gave [name] the opportunity to ask questions and answered the questions asked (if any). I affirm that [name] then provided consent for she's participation in this program.     [name]

## 2021-05-31 ENCOUNTER — Other Ambulatory Visit: Payer: Self-pay

## 2021-05-31 ENCOUNTER — Encounter (HOSPITAL_BASED_OUTPATIENT_CLINIC_OR_DEPARTMENT_OTHER): Payer: Medicare (Managed Care) | Attending: Internal Medicine | Admitting: Internal Medicine

## 2021-05-31 DIAGNOSIS — E11621 Type 2 diabetes mellitus with foot ulcer: Secondary | ICD-10-CM | POA: Insufficient documentation

## 2021-05-31 DIAGNOSIS — Z89431 Acquired absence of right foot: Secondary | ICD-10-CM | POA: Insufficient documentation

## 2021-05-31 DIAGNOSIS — J449 Chronic obstructive pulmonary disease, unspecified: Secondary | ICD-10-CM | POA: Diagnosis not present

## 2021-05-31 DIAGNOSIS — S91301D Unspecified open wound, right foot, subsequent encounter: Secondary | ICD-10-CM | POA: Diagnosis not present

## 2021-05-31 DIAGNOSIS — L97518 Non-pressure chronic ulcer of other part of right foot with other specified severity: Secondary | ICD-10-CM | POA: Insufficient documentation

## 2021-05-31 DIAGNOSIS — X58XXXA Exposure to other specified factors, initial encounter: Secondary | ICD-10-CM | POA: Insufficient documentation

## 2021-05-31 DIAGNOSIS — K7460 Unspecified cirrhosis of liver: Secondary | ICD-10-CM | POA: Insufficient documentation

## 2021-05-31 DIAGNOSIS — N1832 Chronic kidney disease, stage 3b: Secondary | ICD-10-CM | POA: Diagnosis not present

## 2021-05-31 DIAGNOSIS — I129 Hypertensive chronic kidney disease with stage 1 through stage 4 chronic kidney disease, or unspecified chronic kidney disease: Secondary | ICD-10-CM | POA: Diagnosis not present

## 2021-05-31 DIAGNOSIS — E1122 Type 2 diabetes mellitus with diabetic chronic kidney disease: Secondary | ICD-10-CM | POA: Insufficient documentation

## 2021-05-31 DIAGNOSIS — E1151 Type 2 diabetes mellitus with diabetic peripheral angiopathy without gangrene: Secondary | ICD-10-CM | POA: Diagnosis not present

## 2021-05-31 NOTE — Progress Notes (Signed)
MARIAGUADALUPE, MILLES ([id number]) Visit Report for 05/31/2021 Arrival Information Details Patient Name: Date of Service: [name] 05/31/2021 12:30 PM Medical Record Number: [id number] Patient Account Number: [id number] Date of Birth/Sex: Treating RN: 07-07-1961 (60 y.o. [name] Primary Care Haskel Dewalt: [place] Other Clinician: Referring Allianna Beaubien: Treating Giah Fickett/Extender: [name], [name] in Treatment: 14 Visit Information History Since Last Visit Added or deleted any medications: Yes Patient Arrived: Ambulatory Any new allergies or adverse reactions: No Arrival Time: 12:38 Had a fall or experienced change in Yes Accompanied By: self activities of daily living that may affect Transfer Assistance: None risk of falls: Patient Identification Verified: Yes Signs or symptoms of abuse/neglect since last visito No Secondary Verification Process Completed: Yes Hospitalized since last visit: Yes Patient Requires Transmission-Based Precautions: No Implantable device outside of the clinic excluding No Patient Has Alerts: No cellular tissue based products placed in the center since last visit: Pain Present Now: No Electronic Signature(s) Signed: 05/31/2021 5:11:22 PM By: Deon Pilling Entered By: Deon Pilling on 05/31/2021 12:39:07 -------------------------------------------------------------------------------- Encounter Discharge Information Details Patient Name: Date of Service: [name], [name] 05/31/2021 12:30 PM Medical Record Number: [id number] Patient Account Number: [id number] Date of Birth/Sex: Treating RN: 05-27-1961 (60 y.o. [name] Primary Care Zitlaly Malson: [facility], [place] Other Clinician: Referring Jasleen Riepe: Treating Chalon Zobrist/Extender: [name], [name] in Treatment: 14 Encounter Discharge Information Items Post Procedure Vitals Discharge Condition: Stable Temperature (F): 98.8 Ambulatory Status:  Walker Pulse (bpm): 63 Discharge Destination: Home Respiratory Rate (breaths/min): 18 Transportation: Private Auto Blood Pressure (mmHg): 119/48 Accompanied By: self Schedule Follow-up Appointment: Yes Clinical Summary of Care: Patient Declined Electronic Signature(s) Signed: 05/31/2021 5:28:31 PM By: Baruch Gouty RN, BSN Entered By: Baruch Gouty on 05/31/2021 13:35:28 -------------------------------------------------------------------------------- Lower Extremity Assessment Details Patient Name: Date of Service: [name], [name] 05/31/2021 12:30 PM Medical Record Number: [id number] Patient Account Number: [id number] Date of Birth/Sex: Treating RN: 1961/01/29 (60 y.o. [name] Primary Care Stevin Bielinski: [place] Other Clinician: Referring Omero Kowal: Treating Ahlam Piscitelli/Extender: [name], [name] in Treatment: 14 Edema Assessment Assessed: [Left: No] [Right: Yes] Edema: [Left: Ye] [Right: s] Calf Left: Right: Point of Measurement: From Medial Instep 44.5 cm Ankle Left: Right: Point of Measurement: From Medial Instep 22.3 cm Vascular Assessment Pulses: Dorsalis Pedis Palpable: [Right:Yes] Electronic Signature(s) Signed: 05/31/2021 5:11:22 PM By: Deon Pilling Entered By: Deon Pilling on 05/31/2021 12:45:47 -------------------------------------------------------------------------------- Multi Wound Chart Details Patient Name: Date of Service: [name], [name] 05/31/2021 12:30 PM Medical Record Number: [id number] Patient Account Number: [id number] Date of Birth/Sex: Treating RN: [birth year] (60 y.o. [name] Primary Care Kavita Bartl: [place] [place] Other Clinician: Referring Leslye Puccini: Treating Rexann Lueras/Extender: [name], [name] in Treatment: 14 Vital Signs Height(in): Capillary Blood Glucose(mg/dl): 245 Weight(lbs): Pulse(bpm): 14 Body Mass Index(BMI): Blood Pressure(mmHg):  119/48 Temperature(F): 98.8 Respiratory Rate(breaths/min): 20 Photos: [16:No Photos Right, Plantar Amputation Site -] [19:No Photos Right Upper Arm] [N/A:N/A N/A] Wound Location: [16:Transmetatarsal Gradually Appeared] [19:Skin Tear/Laceration] [N/A:N/A] Wounding Event: [16:Diabetic Wound/Ulcer of the Lower] [19:Skin Tear] [N/A:N/A] Primary Etiology: [16:Extremity Chronic Obstructive Pulmonary] [19:N/A] [N/A:N/A] Comorbid History: [16:Disease (COPD), Sleep Apnea, Arrhythmia, Congestive Heart Failure, Coronary Artery Disease, Hypertension, Peripheral Arterial Disease, Cirrhosis , Type II Diabetes, Osteoarthritis, Neuropathy 02/05/2021] [19:03/27/2021] [N/A:N/A] Date Acquired: [16:14] [19:9] [N/A:N/A] Weeks of Treatment: [16:Open] [19:Healed - Epithelialized] [N/A:N/A] Wound Status: [16:0.8x1.3x0.5] [19:0x0x0] [N/A:N/A] Measurements L x W x D (cm) [16:0.817] [19:0] [N/A:N/A] A (cm) : rea [id number] [19:0] [N/A:N/A] Volume (cm) : [  16:50.00%] [19:100.00%] [N/A:N/A] % Reduction in Area: [16:16.70%] [19:100.00%] [N/A:N/A] % Reduction in Volume: [16:Grade 2] [19:Full Thickness Without Exposed] [N/A:N/A] Classification: [16:Medium] [19:Support Structures N/A] [N/A:N/A] Exudate A mount: [16:Serosanguineous] [19:N/A] [N/A:N/A] Exudate Type: [16:red, brown] [19:N/A] [N/A:N/A] Exudate Color: [16:Thickened] [19:N/A] [N/A:N/A] Wound Margin: [16:Large (67-100%)] [19:N/A] [N/A:N/A] Granulation A mount: [16:Pink, Pale] [19:N/A] [N/A:N/A] Granulation Quality: [16:Fat Layer (Subcutaneous Tissue): Yes N/A] [N/A:N/A] Exposed Structures: [16:Fascia: No Tendon: No Muscle: No Joint: No Bone: No None] [19:N/A] [N/A:N/A] Epithelialization: [16:Debridement - Excisional] [19:N/A] [N/A:N/A] Debridement: Pre-procedure Verification/Time Out 13:15 [19:N/A] [N/A:N/A] Taken: [16:Callus, Subcutaneous] [19:N/A] [N/A:N/A] Tissue Debrided: [16:Skin/Subcutaneous Tissue] [19:N/A] [N/A:N/A] Level: [16:1.04] [19:N/A]  [N/A:N/A] Debridement A (sq cm): [16:rea Curette] [19:N/A] [N/A:N/A] Instrument: [16:Minimum] [19:N/A] [N/A:N/A] Bleeding: [16:Pressure] [19:N/A] [N/A:N/A] Hemostasis A chieved: [16:0] [19:N/A] [N/A:N/A] Procedural Pain: [16:0] [19:N/A] [N/A:N/A] Post Procedural Pain: [16:Procedure was tolerated well] [19:N/A] [N/A:N/A] Debridement Treatment Response: [16:0.8x1.3x0.5] [19:N/A] [N/A:N/A] Post Debridement Measurements L x W x D (cm) [16:0.408] [19:N/A] [N/A:N/A] Post Debridement Volume: (cm) [16:macerated callous to periwound.] [19:N/A] [N/A:N/A] Assessment Notes: [16:Debridement] [19:N/A] [N/A:N/A] Treatment Notes Electronic Signature(s) Signed: 05/31/2021 5:04:52 PM By: Levan Hurst RN, BSN Signed: 05/31/2021 5:31:06 PM By: [name] DO Entered By: [name] on 05/31/2021 13:26:01 -------------------------------------------------------------------------------- Multi-Disciplinary Care Plan Details Patient Name: Date of Service: [name], [name] 05/31/2021 12:30 PM Medical Record Number: [id number] Patient Account Number: [id number] Date of Birth/Sex: Treating RN: Apr 09, 1961 (60 y.o. [name] Primary Care Lynnet Hefley: [place] Other Clinician: Referring Sherryll Skoczylas: Treating Addi Pak/Extender: [name], [name] in Treatment: 14 Active Inactive Wound/Skin Impairment Nursing Diagnoses: Impaired tissue integrity Goals: Patient/caregiver will verbalize understanding of skin care regimen Date Initiated: 02/19/2021 Target Resolution Date: 07/02/2021 Goal Status: Active Ulcer/skin breakdown will have a volume reduction of 30% by week 4 Date Initiated: 02/19/2021 Date Inactivated: 03/29/2021 Target Resolution Date: 04/09/2021 Goal Status: Unmet Unmet Reason: PAD Interventions: Assess patient/caregiver ability to obtain necessary supplies Assess patient/caregiver ability to perform ulcer/skin care regimen upon admission and as needed Assess  ulceration(s) every visit Provide education on ulcer and skin care Treatment Activities: Skin care regimen initiated : 02/19/2021 Topical wound management initiated : 02/19/2021 Notes: Electronic Signature(s) Signed: 05/31/2021 5:04:52 PM By: Levan Hurst RN, BSN Entered By: Levan Hurst on 05/31/2021 13:24:24 -------------------------------------------------------------------------------- Pain Assessment Details Patient Name: Date of Service: [name], [name] 05/31/2021 12:30 PM Medical Record Number: [id number] Patient Account Number: [id number] Date of Birth/Sex: Treating RN: [birth year] (60 y.o. [name] Primary Care Latavia Goga: [place] Other Clinician: Referring Avri Paiva: Treating Margaret Cockerill/Extender: [name], [name] in Treatment: 14 Active Problems Location of Pain Severity and Description of Pain Patient Has Paino Yes Site Locations Pain Location: Generalized Pain, Pain in Ulcers Rate the pain. Current Pain Level: 9 Worst Pain Level: 10 Least Pain Level: 0 Tolerable Pain Level: 8 Character of Pain Describe the Pain: Heavy, Sharp, Stabbing Pain Management and Medication Current Pain Management: Medication: Yes Cold Application: No Rest: No Massage: No Activity: No T.E.N.S.: No Heat Application: No Leg drop or elevation: No Is the Current Pain Management Adequate: Adequate How does your wound impact your activities of daily livingo Sleep: No Bathing: No Appetite: No Relationship With Others: No Bladder Continence: No Emotions: No Bowel Continence: No Work: No Toileting: No Drive: No Dressing: No Hobbies: No Electronic Signature(s) Signed: 05/31/2021 5:11:22 PM By: Deon Pilling Entered By: Deon Pilling on 05/31/2021 12:41:02 -------------------------------------------------------------------------------- Patient/Caregiver Education Details Patient Name: Date of Service: HA LEY, [name] 7/18/2022andnbsp12:30  PM Medical Record Number:  [id number] Patient Account Number: [id number] Date of Birth/Gender: Treating RN: 12-19-1960 (60 y.o. [name] Primary Care Physician: [place], [place] Other Clinician: Referring Physician: Treating Physician/Extender: Jannette Spanner, Jairo Ben in Treatment: 14 Education Assessment Education Provided To: Patient Education Topics Provided Wound/Skin Impairment: Methods: Explain/Verbal Responses: State content correctly [employer]) Signed: 05/31/2021 5:04:52 PM By: Levan Hurst RN, BSN Entered By: Levan Hurst on 05/31/2021 13:24:35 -------------------------------------------------------------------------------- Wound Assessment Details Patient Name: Date of Service: [name], [name] 05/31/2021 12:30 PM Medical Record Number: [id number] Patient Account Number: [id number] Date of Birth/Sex: Treating RN: [birth year] (61 y.o. [name] Primary Care Joe Gee: [place], [place] Other Clinician: Referring Tay Whitwell: Treating Dashiell Franchino/Extender: [name], [name] in Treatment: 14 Wound Status Wound Number: 16 Primary Diabetic Wound/Ulcer of the Lower Extremity Etiology: Wound Location: Right, Plantar Amputation Site - Transmetatarsal Wound Open Wounding Event: Gradually Appeared Status: Date Acquired: 02/05/2021 Comorbid Chronic Obstructive Pulmonary Disease (COPD), Sleep Apnea, Weeks Of Treatment: 14 History: Arrhythmia, Congestive Heart Failure, Coronary Artery Disease, Clustered Wound: No Hypertension, Peripheral Arterial Disease, Cirrhosis , Type II Diabetes, Osteoarthritis, Neuropathy Photos Wound Measurements Length: (cm) 0.8 Width: (cm) 1.3 Depth: (cm) 0.5 Area: (cm) 0.817 Volume: (cm) 0.408 % Reduction in Area: 50% % Reduction in Volume: 16.7% Epithelialization: None Tunneling: No Undermining: No Wound Description Classification: Grade 2 Wound Margin: Thickened Exudate  Amount: Medium Exudate Type: Serosanguineous Exudate Color: red, brown Foul Odor After Cleansing: No Slough/Fibrino No Wound Bed Granulation Amount: Large (67-100%) Exposed Structure Granulation Quality: Pink, Pale Fascia Exposed: No Fat Layer (Subcutaneous Tissue) Exposed: Yes Tendon Exposed: No Muscle Exposed: No Joint Exposed: No Bone Exposed: No Assessment Notes macerated callous to periwound. Treatment Notes 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 Wound Cleanser Discharge Instruction: Cleanse the wound with wound cleanser or normal saline prior to applying a clean dressing using gauze sponges, not tissue or cotton balls. Peri-Wound Care Sween Lotion (Moisturizing lotion) Discharge Instruction: Apply moisturizing lotion as directed Topical Primary Dressing KerraCel Ag Gelling Fiber Dressing, 2x2 in (silver alginate) Discharge Instruction: Apply silver alginate to wound bed as instructed Santyl Ointment Discharge Instruction: Apply in clinic only, under alginate Secondary Dressing Woven Gauze Sponge, Non-Sterile 4x4 in Discharge Instruction: Apply over primary dressing as directed. Optifoam Non-Adhesive Dressing, 4x4 in Discharge Instruction: Foam donut to help offload Secured With Compression Wrap Kerlix Roll 4.5x3.1 (in/yd) Discharge Instruction: Apply Kerlix and Coban compression as directed. Coban Self-Adherent Wrap 4x5 (in/yd) Discharge Instruction: Apply over Kerlix as directed. Compression Stockings Add-Ons Electronic Signature(s) Signed: 05/31/2021 3:49:49 PM By: Sandre Kitty Signed: 05/31/2021 5:11:22 PM By: Deon Pilling Entered By: Sandre Kitty on 05/31/2021 15:33:49 -------------------------------------------------------------------------------- Wound Assessment  Details Patient Name: Date of Service: [name], [name] 05/31/2021 12:30 PM Medical Record Number: [id number] Patient Account Number: [id number] Date of Birth/Sex: Treating RN: 03/14/1961 (60 y.o. [name] Primary Care Leba Tibbitts: [place] Other Clinician: Referring Ariyel Jeangilles: Treating Osamah Schmader/Extender: Jannette Spanner, [name] in Treatment: 14 Wound Status Wound Number: 19 Primary Etiology: Skin Tear Wound Location: Right Upper Arm Wound Status: Healed - Epithelialized Wounding Event: Skin Tear/Laceration Date Acquired: 03/27/2021 Weeks Of Treatment: 9 Clustered Wound: No Wound Measurements Length: (cm) Width: (cm) Depth: (cm) Area: (cm) Volume: (cm) 0 % Reduction in Area: 100% 0 % Reduction in Volume: 100% 0 0 0 Wound Description Classification: Full Thickness Without Exposed  Support Structur es Treatment Notes Wound #19 (Upper Arm) Wound Laterality: Right Cleanser Peri-Wound Care Topical Primary Dressing Secondary Dressing Secured With Compression Wrap Compression Stockings Add-Ons Electronic Signature(s) Signed: 05/31/2021 5:11:22 PM By: Deon Pilling Entered By: Deon Pilling on 05/31/2021 12:46:24 -------------------------------------------------------------------------------- Vitals Details Patient Name: Date of Service: [name], [name] 05/31/2021 12:30 PM Medical Record Number: [id number] Patient Account Number: [id number] Date of Birth/Sex: Treating RN: [birth year] (60 y.o. [name] Primary Care Wilena Tyndall: [place] Other Clinician: Referring Loyce Flaming: Treating Gwynevere Lizana/Extender: Jannette Spanner, [name] in Treatment: 14 Vital Signs Time Taken: 12:38 Temperature (F): 98.8 Pulse (bpm): 63 Respiratory Rate (breaths/min): 20 Blood Pressure (mmHg): 119/48 Capillary Blood Glucose (mg/dl): 245 Reference Range: 80 - 120 mg / dl Electronic Signature(s) Signed: 05/31/2021 5:11:22 PM By:  Deon Pilling Entered By: Deon Pilling on 05/31/2021 12:40:33

## 2021-05-31 NOTE — Progress Notes (Signed)
[name], [name] ([id number]) Visit Report for 05/31/2021 Fall Risk Assessment Details Patient Name: Date of Service: [name] 05/31/2021 12:30 PM Medical Record Number: [id number] Patient Account Number: [id number] Date of Birth/Sex: Treating RN: Jun 05, 1961 (60 y.o. [name] Primary Care [name]: [place], [place] Other Clinician: Referring [name]: Treating [name]/Extender: [name], [name] in Treatment: 14 Fall Risk Assessment Items Have you had 2 or more falls in the last 12 monthso 0 Yes Have you had any fall that resulted in injury in the last 12 monthso 0 Yes FALLS RISK SCREEN History of falling - immediate or within 3 months 25 Yes Secondary diagnosis (Do you have 2 or more medical diagnoseso) 0 No Ambulatory aid None/bed rest/wheelchair/nurse 0 Yes Crutches/cane/walker 0 No Furniture 0 No Intravenous therapy Access/Saline/Heparin Lock 0 No Gait/Transferring Normal/ bed rest/ wheelchair 0 Yes Weak (short steps with or without shuffle, stooped but able to lift head while walking, may seek 0 No support from furniture) Impaired (short steps with shuffle, may have difficulty arising from chair, head down, impaired 0 No balance) Mental Status Oriented to own ability 0 Yes Electronic Signature(s) Signed: 05/31/2021 5:11:22 PM By: Deon Pilling Entered By: Deon Pilling on 05/31/2021 12:46:15

## 2021-05-31 NOTE — Progress Notes (Signed)
**Note Karla-Identified via Obfuscation** [name], [name] ([id number]) Visit Report for 05/31/2021 Chief Complaint Document Details Patient Name: Date of Service: [name] 05/31/2021 12:30 PM Medical Record Number: [id number] Patient Account Number: [id number] Date of Birth/Sex: Treating RN: 10-26-1961 (60 y.o. [name] Primary Care Provider: [facility], [facility] Other Clinician: Referring Provider: Treating Provider/Extender: [name], [name] in Treatment: 14 Information Obtained from: Patient Chief Complaint Right plantar foot wound Electronic Signature(s) Signed: 05/31/2021 5:31:06 PM By: Karla Shan DO Entered By: [name] on 05/31/2021 13:26:37 -------------------------------------------------------------------------------- Debridement Details Patient Name: Date of Service: Karla Corwin, DO Karla S. 05/31/2021 12:30 PM Medical Record Number: [id number] Patient Account Number: [id number] Date of Birth/Sex: Treating RN: 09-23-1961 (60 y.o. [name] Primary Care Provider: [facility], [facility] Other Clinician: Referring Provider: Treating Provider/Extender: [name], [name] in Treatment: 14 Debridement Performed for Assessment: Wound #16 Right,Plantar Amputation Site - Transmetatarsal Performed By: Physician Karla Shan, DO Debridement Type: Debridement Severity of Tissue Pre Debridement: Fat layer exposed Level of Consciousness (Pre-procedure): Awake and Alert Pre-procedure Verification/Time Out Yes - 13:15 Taken: Start Time: 13:15 T Area Debrided (L x W): otal 0.8 (cm) x 1.3 (cm) = 1.04 (cm) Tissue and other material debrided: Viable, Non-Viable, Callus, Subcutaneous Level: Skin/Subcutaneous Tissue Debridement Description: Excisional Instrument: Curette Bleeding: Minimum Hemostasis Achieved: Pressure End Time: 13:17 Procedural Pain: 0 Post Procedural Pain: 0 Response to Treatment: Procedure was tolerated well Level of Consciousness (Post-  Awake and Alert procedure): Post Debridement Measurements of Total Wound Length: (cm) 0.8 Width: (cm) 1.3 Depth: (cm) 0.5 Volume: (cm) 0.408 Character of Wound/Ulcer Post Debridement: Improved Severity of Tissue Post Debridement: Fat layer exposed Post Procedure Diagnosis Same as Pre-procedure Electronic Signature(s) Signed: 05/31/2021 5:04:52 PM By: Karla Hurst RN, BSN Signed: 05/31/2021 5:31:06 PM By: Karla Shan DO Entered By: [name] on 05/31/2021 13:21:25 -------------------------------------------------------------------------------- HPI Details Patient Name: Date of Service: Karla Corwin, DO Karla S. 05/31/2021 12:30 PM Medical Record Number: [id number] Patient Account Number: [id number] Date of Birth/Sex: Treating RN: [birth year] (60 y.o. [name] Primary Care Provider: [facility], [facility] Other Clinician: Referring Provider: Treating Provider/Extender: [name], [name] in Treatment: 14 History of Present Illness HPI Description: This 60 year old patient who has a very long significant history of diabetes mellitus, previous alcohol and nicotine abuse, chronic data disease, COPD, diabetes mellitus, height hypertension, critical lower limb ischemia with several wounds being managed at the wound [facility] at [name] for over a year. She was recently in the ER at [name] and was referred to our Price. He has had a long history of critical limb ischemia and over a period of time has had balloon angioplasties in March 2016, endarterectomy of the left carotid, lower extremity angiogram and treatment by [name], several cardiac catheterizations. Most recently she had an x-ray of her left foot while in the ER which showed no acute abnormality. during this ER visit she was started on ciprofloxacin and asked to continue with the wound care physicians at [name]. Her last ABI done in June 2017 showed the right side to be 0.28 in  the left side to be 0.48. Her right toe brachial index was 0.17 on the right and 0.27 on the left. her last hemoglobin A1c was 11.1% She continues to smoke about a pack of cigarettes a day. 03/28/2017 -- -- right foot x-ray -- IMPRESSION:Areas of soft tissue swelling. Mild subluxation second PIP joint. No frank dislocation or fracture. No erosive change or bony destruction.  No soft tissue ulceration or radiopaque foreign body evident by radiography. There is plantar fascia calcification with a nearby inferior calcaneal spur. There are foci of arterial vascular calcification. 04/04/2017 -- the patient continues to be noncompliant and continues to complain of a lot of pain and has not done anything about quitting smoking Readmission: 06/26/18 on evaluation today patient presents for reevaluation she has not been seen in our office since May 2018. Since she was last seen here in our office she has undergone testing at [name] office where it was revealed that she had an ABI of 0.68 with her previous ABI being 0.64 and a left ABI of 0.38 with previous ABI being 0.34 this study which was performed on 04/05/18 was pretty much equivalent to the study performed on 11/22/17. The findings in the end revealed on the right would appear to be moderate right lower extremity arterial disease on the left [name] states moderate in the report but unfortunately this appears to be much more severe at 0.38 compared to the right. Subsequently the patient was scheduled to have angiography with [name] on 04/12/18. This was however canceled due to the fact that the patient was Price to have chronic kidney disease stage III and it was to the point that he did not feel that it will be safe to pursue angiography at that point. She has not been on any antibiotics recently. At this point [name] has not rescheduled anything as far as the injured [name] according to the patient he is somewhat reluctant to do so.  Nonetheless with her diminished blood flow this is gonna make it somewhat difficult for her to heal. 07/05/18; this is a patient I have not seen previously. She has very significant PAD as noted above. She apparently has had revascularization efforts by [name] on the right on 3 occasions to the patient. She was supposed to have an attempted angiography on the left however this was canceled apparently because of stage III chronic renal failure. I will need to research all of this. She complains of significant pain in the wound and has claudication enough that when she walks to the end of the driveway she has to stop. She is been using silver alginate to the wounds on her legs and Iodoflex to the area on her left second toe. 07/13/18 on evaluation today patient's wounds actually appear to be doing about the same. She has an appointment she tells me within the next month that is September 2019 with [name] to discuss options to see if there's anything else that you can recommend or do for her. Nonetheless obviously what we're trying to do is do what we can to save her leg and in turn prevent any additional worsening or damage. None in the meantime we been mainly trying to manage her ulcers as best we can. 07/27/18; some improvement in the multiplicity of wounds on her left lower extremity and foot. She's been using silver alginate. I was unable to determine that she actually has an appointment with [name]. We are checking into this The patient's wound includes Left lateral foot, left plantar heel, her left anterior calf wound looks close to me, left fourth toe is very close to closed and the left medial calf is perhaps the largest open area READMISSION 02/19/2021 This is a now 60 year old woman with type 2 diabetes and continued cigarette smoking. She has known PAD. She was last in this clinic in 2019 at that  point with wounds on her left foot. She left in a nonhealed state. On 06/28/2019 I see she  had a left femoropopliteal by vein and vascular with an amputation of the fifth ray. These managed to heal over. Her last arterial studies were in February 2021. This showed an absent waveform at the posterior tibial artery on the right a dampened monophasic dorsalis pedis on the right of 0.44. On the left again the PTA TA was absent her dorsalis pedis was 0.99 triphasic and her great toe pressure was 0.65. This would have been after her revascularization. Once again she tells me that [name] has done revascularization on the right leg on 3 different occasions. She has a right transmetatarsal amputation apparently done by Dr. Sharol Given remotely but I do not see a note for these right lower extremity revascularization but I may not of looked back far enough. In any case she says that the she has a wound on the plantar aspect of the right transmetatarsal amputation site there is been there for several months. More recently she fell and had a wound on her right medial lower leg she had 5 sutures placed in the ER and then subsequently she has developed an area on the medial lower leg which was a blister that opened up. Past medical history includes type 2 diabetes, PAD, chronic renal failure, congestive heart failure, right transmetatarsal amputation, left fifth ray amputation, continued tobacco abuse, atrial fibrillation and cirrhosis. We did not attempt an ABI on the right leg today because of pain 02/26/2021; patient we admitted to the clinic last week. She has what looks to be 2 areas on her right medial and right anterior lower leg that look more like venous wounds but she has 1 on the first met head at the base of her right transmet. She has known severe PAD. She complains of a lot of pain although some of this may be neuropathic. Is difficult to exclude a component of claudication. Use silver alginate on the wounds on her legs and Iodoflex on the area on the first met head. She has an appointment with  [name] on 5/25 although I will text him and discussed the situation. She is have poorly controlled diabetic. She has has stage IIIb chronic renal failure 4/22; patient presents for 1 week follow-up. The 2 areas on her right medial and right anterior lower leg appear well-healing. She has been using silver alginate to this area without issues. She has a first met head ulcer and it is unclear how long this has been there as it was discovered in the ED earlier this month when she was being evaluated for another issue. Iodoflex has been used at this area. 4/29; patient presents for 1 week follow-up. She has been using silver alginate to the leg wounds and Iodoflex to the plantar foot wound. She has had this wrapped with Coban and Kerlix. She has no concerns or complaints today. 5/6; this is a difficult wound on the right plantar foot transmit site. We are not making a lot of progress. She had 2 more venous looking wounds on the right medial and right anterior lower leg 1 of which is healed. Her appointment with [name] is on 5/25 5/16; she did not tolerate the offloading shoe we gave her in fact she had a fall with an abrasion on her right forearm she is now back in the modified small shoe which does not offload her foot properly. Her appoint with [name]  is still on 5/25 we have been using Iodoflex. The area on the right anterior lower leg is healed 7/18; patient presents for follow-up however has not been seen in 2 months. She was last seen at the end of May. She had a fall at the end of June and was hospitalized. She has been unable to follow-up since then. For the wound she has only been keeping it covered with gauze. She is scheduled for an aortogram with lower extremity runoff at the end of July. Electronic Signature(s) Signed: 05/31/2021 5:31:06 PM By: Karla Shan DO Entered By: [name] on 05/31/2021  13:27:50 -------------------------------------------------------------------------------- Physical Exam Details Patient Name: Date of Service: Karla Corwin, DO Karla S. 05/31/2021 12:30 PM Medical Record Number: [id number] Patient Account Number: [id number] Date of Birth/Sex: Treating RN: 01-11-1961 (60 y.o. [name] Primary Care Provider: [facility], [facility] Other Clinician: Referring Provider: Treating Provider/Extender: [name], [name] in Treatment: 14 Constitutional respirations regular, non-labored and within target range for patient.[name] [facility] pleasant and cooperative. Notes Right foot: Trans-met with open wound with nonviable tissue and granulation tissue present. Significant callus and macerated skin circumferentially. No signs of infection. Electronic Signature(s) Signed: 05/31/2021 5:31:06 PM By: Karla Shan DO Entered By: [name] on 05/31/2021 13:29:11 -------------------------------------------------------------------------------- Physician Orders Details Patient Name: Date of Service: Karla Corwin, DO Karla S. 05/31/2021 12:30 PM Medical Record Number: [id number] Patient Account Number: [id number] Date of Birth/Sex: Treating RN: Oct 04, 1961 (60 y.o. [name] Primary Care Provider: Other Clinician: Center, Romelle Starcher Referring Provider: Treating Provider/Extender: [name] Price, [name] in Treatment: 14 Verbal / Phone Orders: No Diagnosis Coding ICD-10 Coding Code Description 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.518 Non-pressure chronic ulcer of other part of right foot with other specified severity E11.51 Type 2 diabetes mellitus with diabetic peripheral angiopathy without gangrene S40.811D Abrasion of right upper arm, subsequent encounter Follow-up Appointments ppointment in 1 week. - with Dr. Heber [place] Return A Bathing/ Shower/ Hygiene May shower with protection but do not get wound dressing(s)  wet. Edema Control - Lymphedema / SCD / Other Elevate legs to the level of the heart or above for 30 minutes daily and/or when sitting, a frequency of: - throughout the day Avoid standing for long periods of time. Exercise regularly Off-Loading Open toe surgical shoe to: - with felt padding to offload Additional Orders / Instructions Stop/Decrease Smoking Follow [place] wound care orders this week; continue Home Health for wound care. May utilize formulary equivalent dressing for wound treatment orders unless otherwise specified. - Change primary dressing to silver alginate and kerlix/coban compression wrap Other Home Health Orders/Instructions: - [facility] Other Home Health Orders/Instructions: - Ok for Physical Therapy to work with patient on standing and walking Wound Treatment Wound #16 - Amputation Site - Transmetatarsal Wound Laterality: Plantar, Right Cleanser: Soap and Water 2 x Per Week/15 Days Discharge Instructions: May shower and wash wound with dial antibacterial soap and water prior to dressing change. Cleanser: Wound Cleanser (Home Health) 2 x Per Week/15 Days Discharge Instructions: Cleanse the wound with wound cleanser or normal saline prior to applying a clean dressing using gauze sponges, not tissue or cotton balls. Peri-Wound Care: Sween Lotion (Moisturizing lotion) (Home Health) 2 x Per Week/15 Days Discharge Instructions: Apply moisturizing lotion as directed Prim Dressing: KerraCel Ag Gelling Fiber Dressing, 2x2 in (silver alginate) (Home Health) 2 x Per Week/15 Days ary Discharge Instructions: Apply silver alginate to wound bed as instructed Prim Dressing: Santyl Ointment 2  x Per Week/15 Days ary Discharge Instructions: Apply in clinic only, under alginate Secondary Dressing: Woven Gauze Sponge, Non-Sterile 4x4 in 2 x Per Week/15 Days Discharge Instructions: Apply over primary dressing as directed. Secondary Dressing: Optifoam Non-Adhesive  Dressing, 4x4 in 2 x Per Week/15 Days Discharge Instructions: Foam donut to help offload Compression Wrap: Kerlix Roll 4.5x3.1 (in/yd) 2 x Per Week/15 Days Discharge Instructions: Apply Kerlix and Coban compression as directed. Compression Wrap: Coban Self-Adherent Wrap 4x5 (in/yd) 2 x Per Week/15 Days Discharge Instructions: Apply over Kerlix as directed. Electronic Signature(s) Signed: 05/31/2021 5:31:06 PM By: Karla Shan DO Entered By: [name] on 05/31/2021 13:29:28 -------------------------------------------------------------------------------- Problem List Details Patient Name: Date of Service: Karla Corwin, DO Karla S. 05/31/2021 12:30 PM Medical Record Number: [id number] Patient Account Number: [id number] Date of Birth/Sex: Treating RN: 10-09-1961 (60 y.o. [name] Primary Care Provider: [facility], [facility] Other Clinician: Referring Provider: Treating Provider/Extender: [name], [name] in Treatment: 14 Active Problems ICD-10 Encounter Code Description Active Date MDM Diagnosis 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 2 diabetes mellitus with foot ulcer 02/19/2021 No Yes S91.301D Unspecified open wound, right foot, subsequent encounter 05/31/2021 No Yes E11.51 Type 2 diabetes mellitus with diabetic peripheral angiopathy without gangrene 02/19/2021 No Yes Inactive Problems ICD-10 Code Description Active Date Inactive Date L97.811 Non-pressure chronic ulcer of other part of right lower leg limited to breakdown of skin 02/19/2021 02/19/2021 Resolved Problems ICD-10 Code Description Active Date Resolved Date S40.811D Abrasion of right upper arm, subsequent encounter 03/29/2021 03/29/2021 Electronic Signature(s) Signed: 05/31/2021 5:31:06 PM By: Karla Shan DO Entered By: [name] on 05/31/2021 13:23:16 -------------------------------------------------------------------------------- Progress Note Details Patient Name: Date of Service: Karla Corwin, DO Karla S. 05/31/2021  12:30 PM Medical Record Number: [id number] Patient Account Number: [id number] Date of Birth/Sex: Treating RN: 08-27-1961 (60 y.o. [name] Primary Care Provider: [facility], [facility] Other Clinician: Referring Provider: Treating Provider/Extender: [name] Price, [name] in Treatment: 14 Subjective Chief Complaint Information obtained from Patient Right plantar foot wound History of Present Illness (HPI) This 60 year old patient who has a very long significant history of diabetes mellitus, previous alcohol and nicotine abuse, chronic data disease, COPD, diabetes mellitus, height hypertension, critical lower limb ischemia with several wounds being managed at the wound [facility] at [facility] for over a year. She was recently in the ER at [facility] and was referred to our Price. He has had a long history of critical limb ischemia and over a period of time has had balloon angioplasties in March 2016, endarterectomy of the left carotid, lower extremity angiogram and treatment by [name], several cardiac catheterizations. Most recently she had an x-ray of her left foot while in the ER which showed no acute abnormality. during this ER visit she was started on ciprofloxacin and asked to continue with the wound care physicians at [facility]. Her last ABI done in June 2017 showed the right side to be 0.28 in the left side to be 0.48. Her right toe brachial index was 0.17 on the right and 0.27 on the left. her last hemoglobin A1c was 11.1% She continues to smoke about a pack of cigarettes a day. 03/28/2017 -- -- right foot x-ray -- IMPRESSION:Areas of soft tissue swelling. Mild subluxation second PIP joint. No frank dislocation or fracture. No erosive change or bony destruction. No soft tissue ulceration or radiopaque foreign body evident by radiography. There is plantar fascia calcification with a nearby inferior calcaneal spur. There are foci of arterial  vascular calcification. 04/04/2017 --  the patient continues to be noncompliant and continues to complain of a lot of pain and has not done anything about quitting smoking Readmission: 06/26/18 on evaluation today patient presents for reevaluation she has not been seen in our office since May 2018. Since she was last seen here in our office she has undergone testing at Dr. Fransico Michael office where it was revealed that she had an ABI of 0.68 with her previous ABI being 0.64 and a left ABI of 0.38 with previous ABI being 0.34 this study which was performed on 04/05/18 was pretty much equivalent to the study performed on 11/22/17. The findings in the end revealed on the right would appear to be moderate right lower extremity arterial disease on the left [name] states moderate in the report but unfortunately this appears to be much more severe at 0.38 compared to the right. Subsequently the patient was scheduled to have angiography with [name] on 04/12/18. This was however canceled due to the fact that the patient was Price to have chronic kidney disease stage III and it was to the point that he did not feel that it will be safe to pursue angiography at that point. She has not been on any antibiotics recently. At this point [name] has not rescheduled anything as far as the injured [place] according to the patient he is somewhat reluctant to do so. Nonetheless with her diminished blood flow this is gonna make it somewhat difficult for her to heal. 07/05/18; this is a patient I have not seen previously. She has very significant PAD as noted above. She apparently has had revascularization efforts by [name] on the right on 3 occasions to the patient. She was supposed to have an attempted angiography on the left however this was canceled apparently because of stage III chronic renal failure. I will need to research all of this. She complains of significant pain in the wound and has claudication  enough that when she walks to the end of the driveway she has to stop. She is been using silver alginate to the wounds on her legs and Iodoflex to the area on her left second toe. 07/13/18 on evaluation today patient's wounds actually appear to be doing about the same. She has an appointment she tells me within the next month that is September 2019 with [name] to discuss options to see if there's anything else that you can recommend or do for her. Nonetheless obviously what we're trying to do is do what we can to save her leg and in turn prevent any additional worsening or damage. None in the meantime we been mainly trying to manage her ulcers as best we can. 07/27/18; some improvement in the multiplicity of wounds on her left lower extremity and foot. She's been using silver alginate. I was unable to determine that she actually has an appointment with [name]. We are checking into this The patient's wound includes Left lateral foot, left plantar heel, her left anterior calf wound looks close to me, left fourth toe is very close to closed and the left medial calf is perhaps the largest open area READMISSION 02/19/2021 This is a now 60 year old woman with type 2 diabetes and continued cigarette smoking. She has known PAD. She was last in this clinic in 2019 at that point with wounds on her left foot. She left in a nonhealed state. On 06/28/2019 I see she had a left femoropopliteal by vein and vascular with an amputation of the  fifth ray. These managed to heal over. Her last arterial studies were in February 2021. This showed an absent waveform at the posterior tibial artery on the right a dampened monophasic dorsalis pedis on the right of 0.44. On the left again the PTA TA was absent her dorsalis pedis was 0.99 triphasic and her great toe pressure was 0.65. This would have been after her revascularization. Once again she tells me that [name] has done revascularization on the right leg on  3 different occasions. She has a right transmetatarsal amputation apparently done by Dr. Sharol Given remotely but I do not see a note for these right lower extremity revascularization but I may not of looked back far enough. In any case she says that the she has a wound on the plantar aspect of the right transmetatarsal amputation site there is been there for several months. More recently she fell and had a wound on her right medial lower leg she had 5 sutures placed in the ER and then subsequently she has developed an area on the medial lower leg which was a blister that opened up. Past medical history includes type 2 diabetes, PAD, chronic renal failure, congestive heart failure, right transmetatarsal amputation, left fifth ray amputation, continued tobacco abuse, atrial fibrillation and cirrhosis. We did not attempt an ABI on the right leg today because of pain 02/26/2021; patient we admitted to the clinic last week. She has what looks to be 2 areas on her right medial and right anterior lower leg that look more like venous wounds but she has 1 on the first met head at the base of her right transmet. She has known severe PAD. She complains of a lot of pain although some of this may be neuropathic. Is difficult to exclude a component of claudication. Use silver alginate on the wounds on her legs and Iodoflex on the area on the first met head. She has an appointment with [name] on 5/25 although I will text him and discussed the situation. She is have poorly controlled diabetic. She has has stage IIIb chronic renal failure 4/22; patient presents for 1 week follow-up. The 2 areas on her right medial and right anterior lower leg appear well-healing. She has been using silver alginate to this area without issues. She has a first met head ulcer and it is unclear how long this has been there as it was discovered in the ED earlier this month when she was being evaluated for another issue. Iodoflex has been  used at this area. 4/29; patient presents for 1 week follow-up. She has been using silver alginate to the leg wounds and Iodoflex to the plantar foot wound. She has had this wrapped with Coban and Kerlix. She has no concerns or complaints today. 5/6; this is a difficult wound on the right plantar foot transmit site. We are not making a lot of progress. She had 2 more venous looking wounds on the right medial and right anterior lower leg 1 of which is healed. Her appointment with [name] is on 5/25 5/16; she did not tolerate the offloading shoe we gave her in fact she had a fall with an abrasion on her right forearm she is now back in the modified small shoe which does not offload her foot properly. Her appoint with [name] is still on 5/25 we have been using Iodoflex. The area on the right anterior lower leg is healed 7/18; patient presents for follow-up however has not been seen in 2  months. She was last seen at the end of May. She had a fall at the end of June and was hospitalized. She has been unable to follow-up since then. For the wound she has only been keeping it covered with gauze. She is scheduled for an aortogram with lower extremity runoff at the end of July. Patient History Information obtained from Patient. Family History Cancer - Mother, Diabetes - Mother, Heart Disease - Mother,Father, Hypertension - Mother,Father, Lung Disease - Siblings, Stroke - Mother, Thyroid Problems - Mother, No family history of Hereditary Spherocytosis, Kidney Disease, Seizures, Tuberculosis. Social History Former smoker, Marital Status - Divorced, Alcohol Use - Never, Drug Use - Current History - marijuana, Caffeine Use - Daily - Coffee. Medical History Eyes Denies history of Cataracts, Glaucoma, Optic Neuritis Ear/Nose/Mouth/Throat Denies history of Chronic sinus problems/congestion, Middle ear problems Hematologic/Lymphatic Denies history of Anemia, Hemophilia, Human Immunodeficiency Virus,  Lymphedema, Sickle Cell Disease Respiratory Patient has history of Chronic Obstructive Pulmonary Disease (COPD), Sleep Apnea Denies history of Aspiration, Asthma, Pneumothorax, Tuberculosis Cardiovascular Patient has history of Arrhythmia - NSVT , PAF , PAT Congestive Heart Failure - chronic combined, Coronary Artery Disease, Hypertension, Peripheral , Arterial Disease Denies history of Angina, Deep Vein Thrombosis, Hypotension, Myocardial Infarction, Peripheral Venous Disease, Phlebitis, Vasculitis Gastrointestinal Patient has history of Cirrhosis - alcoholic Denies history of Colitis, Crohnoos, Hepatitis A, Hepatitis B, Hepatitis C Endocrine Patient has history of Type II Diabetes Denies history of Type I Diabetes Genitourinary Denies history of End Stage Renal Disease Immunological Denies history of Lupus Erythematosus, Raynaudoos, Scleroderma Integumentary (Skin) Denies history of History of Burn Musculoskeletal Patient has history of Osteoarthritis Denies history of Gout, Rheumatoid Arthritis, Osteomyelitis Neurologic Patient has history of Neuropathy - dm2 peripheral Denies history of Dementia, Quadriplegia, Paraplegia, Seizure Disorder Oncologic Denies history of Received Chemotherapy, Received Radiation Psychiatric Denies history of Anorexia/bulimia, Confinement Anxiety Hospitalization/Surgery History - balloon angioplasty. - cardiac cath X2. - cardioversionX2. - carotid angiogram. - DandC uterus. - endarcdectomy (L). - (L) heart cath. - lower ext angiogram X2. - peripheral athrectomy. - 05/03/2021-05/05/2021 inpatient cone-fall CKD. Medical A Surgical History Notes nd Constitutional Symptoms (General Health) alcohol abuse , cocaine abuse , marijuana abuse , narcotic abuse , noncompliance , obesity , tobacco abuse , insomnia , Hematologic/Lymphatic B12 deficiency Respiratory wheezing , Cardiovascular cardiomyopathy , critical lower limb ischemia , elevated troponin ,  hyperlipidemia , hypokalemia , hypomagnesiumia , symptomatic bradycardia , carotid arterial disease ,demand ischemia , Gastrointestinal GERD Endocrine hypothyroidism Genitourinary renal insufficiency , CKD Stage 3 , Integumentary (Skin) cellulitis Psychiatric anxiety Objective Constitutional respirations regular, non-labored and within target range for patient.. Vitals Time Taken: 12:38 PM, Temperature: 98.8 F, Pulse: 63 bpm, Respiratory Rate: 20 breaths/min, Blood Pressure: 119/48 mmHg, Capillary Blood Glucose: 245 mg/dl. Psychiatric pleasant and cooperative. General Notes: Right foot: Trans-met with open wound with nonviable tissue and granulation tissue present. Significant callus and macerated skin circumferentially. No signs of infection. Integumentary (Hair, Skin) Wound #16 status is Open. Original cause of wound was Gradually Appeared. The date acquired was: 02/05/2021. The wound has been in treatment 14 Price. The wound is located on the Right,Plantar Amputation Site - Transmetatarsal. The wound measures 0.8cm length x 1.3cm width x 0.5cm depth; 0.817cm^2 area and 0.408cm^3 volume. There is Fat Layer (Subcutaneous Tissue) exposed. There is no tunneling or undermining noted. There is a medium amount of serosanguineous drainage noted. The wound margin is thickened. There is large (67-100%) pink, pale granulation within the wound bed. General Notes: macerated callous  to periwound. Wound #19 status is Healed - Epithelialized. Original cause of wound was Skin Tear/Laceration. The date acquired was: 03/27/2021. The wound has been in treatment 9 Price. The wound is located on the Right Upper Arm. The wound measures 0cm length x 0cm width x 0cm depth; 0cm^2 area and 0cm^3 volume. Assessment Active Problems ICD-10 Type 2 diabetes mellitus with foot ulcer Unspecified open wound, right foot, subsequent encounter Type 2 diabetes mellitus with diabetic peripheral angiopathy without  gangrene Patient was last seen on 04/05/2021 and being treated for her current wound and also an abrasion. She no longer has the abrasion on her upper arm. We previously treated her with Iodoflex and Kerlix/Coban. In the past couple months she has not been using a dressing but keeping it covered with gauze. At this time I recommended using silver alginate with Kerlix/Coban And foam donut. She has home health and we will send in new orders. She may benefit from a cast and we will discuss this at next clinic visit. She may also benefit from a skin substitute like PuraPly and Apligraf. She has known PAD and is scheduled to have an abdominal aortography with lower extremity runoff on 7/25 with [name] Procedures Wound #16 Pre-procedure diagnosis of Wound #16 is a Diabetic Wound/Ulcer of the Lower Extremity located on the Right,Plantar Amputation Site - Transmetatarsal .Severity of Tissue Pre Debridement is: Fat layer exposed. There was a Excisional Skin/Subcutaneous Tissue Debridement with a total area of 1.04 sq cm performed by Karla Shan, DO. With the following instrument(s): Curette to remove Viable and Non-Viable tissue/material. Material removed includes Callus and Subcutaneous Tissue and. No specimens were taken. A time out was conducted at 13:15, prior to the start of the procedure. A Minimum amount of bleeding was controlled with Pressure. The procedure was tolerated well with a pain level of 0 throughout and a pain level of 0 following the procedure. Post Debridement Measurements: 0.8cm length x 1.3cm width x 0.5cm depth; 0.408cm^3 volume. Character of Wound/Ulcer Post Debridement is improved. Severity of Tissue Post Debridement is: Fat layer exposed. Post procedure Diagnosis Wound #16: Same as Pre-Procedure Plan Follow-up Appointments: Return Appointment in 1 week. - with Dr. Heber [place] Bathing/ Shower/ Hygiene: May shower with protection but do not get wound dressing(s) wet. Edema  Control - Lymphedema / SCD / Other: Elevate legs to the level of the heart or above for 30 minutes daily and/or when sitting, a frequency of: - throughout the day Avoid standing for long periods of time. Exercise regularly Off-Loading: Open toe surgical shoe to: - with felt padding to offload Additional Orders / Instructions: Stop/Decrease Smoking Follow Nutritious Diet Home Health: New wound care orders this week; continue Home Health for wound care. May utilize formulary equivalent dressing for wound treatment orders unless otherwise specified. - Change primary dressing to silver alginate and kerlix/coban compression wrap Other Home Health Orders/Instructions: - [facility] Other Home Health Orders/Instructions: - [facility] for Physical Therapy to work with patient on standing and walking WOUND #16: - Amputation Site - Transmetatarsal Wound Laterality: Plantar, Right Cleanser: Soap and Water 2 x Per Week/15 Days Discharge Instructions: May shower and wash wound with dial antibacterial soap and water prior to dressing change. Cleanser: Wound Cleanser (Home Health) 2 x Per Week/15 Days Discharge Instructions: Cleanse the wound with wound cleanser or normal saline prior to applying a clean dressing using gauze sponges, not tissue or cotton balls. Peri-Wound Care: Sween Lotion (Moisturizing lotion) (Home Health) 2 x Per Week/15 Days Discharge  Instructions: Apply moisturizing lotion as directed Prim Dressing: KerraCel Ag Gelling Fiber Dressing, 2x2 in (silver alginate) (Home Health) 2 x Per Week/15 Days ary Discharge Instructions: Apply silver alginate to wound bed as instructed Prim Dressing: Santyl Ointment 2 x Per Week/15 Days ary Discharge Instructions: Apply in clinic only, under alginate Secondary Dressing: Woven Gauze Sponge, Non-Sterile 4x4 in 2 x Per Week/15 Days Discharge Instructions: Apply over primary dressing as directed. Secondary Dressing: Optifoam Non-Adhesive Dressing, 4x4 in 2 x  Per Week/15 Days Discharge Instructions: Foam donut to help offload Com pression Wrap: Kerlix Roll 4.5x3.1 (in/yd) 2 x Per Week/15 Days Discharge Instructions: Apply Kerlix and Coban compression as directed. Com pression Wrap: Coban Self-Adherent Wrap 4x5 (in/yd) 2 x Per Week/15 Days Discharge Instructions: Apply over Kerlix as directed. 1. In office sharp debridement 2. Silver alginate under Kerlix/Coban 3. Follow-up in 1 week Electronic Signature(s) Signed: 05/31/2021 5:31:06 PM By: Karla Shan DO Entered By: [name] on 05/31/2021 13:35:04 -------------------------------------------------------------------------------- HxROS Details Patient Name: Date of Service: Karla Corwin, DO Karla S. 05/31/2021 12:30 PM Medical Record Number: [id number] Patient Account Number: [id number] Date of Birth/Sex: Treating RN: [birth year] (60 y.o. [name] Primary Care Provider: [facility], [facility] Other Clinician: Referring Provider: Treating Provider/Extender: [name], [name] in Treatment: 14 Information Obtained From Patient Constitutional Symptoms (General Health) Medical History: Past Medical History Notes: alcohol abuse , cocaine abuse , marijuana abuse , narcotic abuse , noncompliance , obesity , tobacco abuse , insomnia , Eyes Medical History: Negative for: Cataracts; Glaucoma; Optic Neuritis Ear/Nose/Mouth/Throat Medical History: Negative for: Chronic sinus problems/congestion; Middle ear problems Hematologic/Lymphatic Medical History: Negative for: Anemia; Hemophilia; Human Immunodeficiency Virus; Lymphedema; Sickle Cell Disease Past Medical History Notes: B12 deficiency Respiratory Medical History: Positive for: Chronic Obstructive Pulmonary Disease (COPD); Sleep Apnea Negative for: Aspiration; Asthma; Pneumothorax; Tuberculosis Past Medical History Notes: wheezing , Cardiovascular Medical History: Positive for: Arrhythmia - NSVT , PAF , PAT  Congestive Heart Failure - chronic combined; Coronary Artery Disease; Hypertension; Peripheral Arterial ; Disease Negative for: Angina; Deep Vein Thrombosis; Hypotension; Myocardial Infarction; Peripheral Venous Disease; Phlebitis; Vasculitis Past Medical History Notes: cardiomyopathy , critical lower limb ischemia , elevated troponin , hyperlipidemia , hypokalemia , hypomagnesiumia , symptomatic bradycardia , carotid arterial disease ,demand ischemia , Gastrointestinal Medical History: Positive for: Cirrhosis - alcoholic Negative for: Colitis; Crohns; Hepatitis A; Hepatitis B; Hepatitis C Past Medical History Notes: GERD Endocrine Medical History: Positive for: Type II Diabetes Negative for: Type I Diabetes Past Medical History Notes: hypothyroidism Time with diabetes: 5 years Treated with: Insulin Blood sugar tested every day: Yes Tested : 3x a day Genitourinary Medical History: Negative for: End Stage Renal Disease Past Medical History Notes: renal insufficiency , CKD Stage 3 , Immunological Medical History: Negative for: Lupus Erythematosus; Raynauds; Scleroderma Integumentary (Skin) Medical History: Negative for: History of Burn Past Medical History Notes: cellulitis Musculoskeletal Medical History: Positive for: Osteoarthritis Negative for: Gout; Rheumatoid Arthritis; Osteomyelitis Neurologic Medical History: Positive for: Neuropathy - dm2 peripheral Negative for: Dementia; Quadriplegia; Paraplegia; Seizure Disorder Oncologic Medical History: Negative for: Received Chemotherapy; Received Radiation Psychiatric Medical History: Negative for: Anorexia/bulimia; Confinement Anxiety Past Medical History Notes: anxiety Immunizations Pneumococcal Vaccine: Received Pneumococcal Vaccination: Yes Tetanus Vaccine: Last tetanus shot: 10/14/1981 Implantable Devices None Hospitalization / Surgery History Type of Hospitalization/Surgery balloon angioplasty cardiac  cath X2 cardioversionX2 carotid angiogram DandC uterus endarcdectomy (L) (L) heart cath lower ext angiogram X2 peripheral athrectomy 05/03/2021-05/05/2021 inpatient cone-fall CKD Family and Social History Cancer: Yes - Mother;  Diabetes: Yes - Mother; Heart Disease: Yes - Mother,Father; Hereditary Spherocytosis: No; Hypertension: Yes - Mother,Father; Kidney Disease: No; Lung Disease: Yes - Siblings; Seizures: No; Stroke: Yes - Mother; Thyroid Problems: Yes - Mother; Tuberculosis: No; Former smoker; Marital Status - Divorced; Alcohol Use: Never; Drug Use: Current History - marijuana; Caffeine Use: Daily - Coffee; Financial Concerns: No; Food, Clothing or Shelter Needs: No; Support System Lacking: No; Transportation Concerns: No Electronic Signature(s) Signed: 05/31/2021 5:11:22 PM By: Deon Pilling Signed: 05/31/2021 5:31:06 PM By: Karla Shan DO Entered By: [name] on 05/31/2021 13:28:00 -------------------------------------------------------------------------------- SuperBill Details Patient Name: Date of Service: Karla Corwin, DO Karla S. 05/31/2021 Medical Record Number: [id number] Patient Account Number: [id number] Date of Birth/Sex: Treating RN: [birth year] (60 y.o. [name] Primary Care Provider: [facility], [facility] Other Clinician: Referring Provider: Treating Provider/Extender: [name] Price, [name] in Treatment: 14 Diagnosis Coding ICD-10 Codes Code Description 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 2 diabetes mellitus with foot ulcer S91.301D Unspecified open wound, right foot, subsequent encounter E11.51 Type 2 diabetes mellitus with diabetic peripheral angiopathy without gangrene Facility Procedures CPT4 Code: [id number] Description: 70350 - DEB SUBQ TISSUE 20 SQ CM/< ICD-10 Diagnosis Description 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 2 diabetes mellitus with foot ulcer S91.301D Unspecified open wound, right foot, subsequent encounter E11.51 Type 2 diabetes mellitus with diabetic peripheral   angiopathy without gangren Modifier: e Quantity: 1 Physician Procedures : CPT4 Code Description Modifier [id number] - WC PHYS SUBQ TISS 20 SQ CM ICD-10 Diagnosis Description 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 2 diabetes mellitus with foot ulcer S91.301D Unspecified open wound, right foot, subsequent encounter E11.51 Type 2 diabetes mellitus  with diabetic peripheral angiopathy without gangrene Quantity: 1 Electronic Signature(s) Signed: 05/31/2021 5:31:06 PM By: Karla Shan DO Entered By: [name] on 05/31/2021 13:35:22

## 2021-05-31 NOTE — Telephone Encounter (Signed)
Per Dr Diona Foley BMP prior to abdominal aortogram 06/07/21-left voicemail message for patient to call me.

## 2021-06-01 NOTE — Addendum Note (Signed)
Addended by: Katrine Coho on: 06/01/2021 09:15 AM   Modules accepted: Orders

## 2021-06-01 NOTE — Telephone Encounter (Addendum)
Left message at all numbers listed for patient to let her know she needs BMP prior to abdominal aortogram 06/07/21. I need to let patient know she will need to [facility] when she goes for carotid doppler 06/02/21 at the [facility] 1 PM.

## 2021-06-01 NOTE — Telephone Encounter (Signed)
Call placed to all numbers listed for pt to discuss getting BMP. I left voice mail message at mobile number listed for  patient asking pt to get BMP at [facility] tomorrow, call me questions.

## 2021-06-02 ENCOUNTER — Telehealth: Payer: Self-pay | Admitting: *Deleted

## 2021-06-02 ENCOUNTER — Other Ambulatory Visit: Payer: Self-pay

## 2021-06-02 ENCOUNTER — Ambulatory Visit (HOSPITAL_COMMUNITY)
Admission: RE | Admit: 2021-06-02 | Discharge: 2021-06-02 | Disposition: A | Discharge status: Home / Self Care | Payer: Medicare (Managed Care) | Source: Ambulatory Visit | Attending: Cardiology | Admitting: Cardiology

## 2021-06-02 ENCOUNTER — Other Ambulatory Visit (HOSPITAL_COMMUNITY): Payer: Self-pay | Admitting: Cardiovascular Disease

## 2021-06-02 DIAGNOSIS — I6521 Occlusion and stenosis of right carotid artery: Secondary | ICD-10-CM

## 2021-06-02 DIAGNOSIS — Z9889 Other specified postprocedural states: Secondary | ICD-10-CM

## 2021-06-02 NOTE — Telephone Encounter (Signed)
Error encounter. 

## 2021-06-02 NOTE — Telephone Encounter (Signed)
I have spoken with patient this morning-she knows to go by lab at [facility] after carotid doppler today to get BMP.

## 2021-06-03 ENCOUNTER — Telehealth: Payer: Self-pay | Admitting: *Deleted

## 2021-06-03 LAB — BASIC METABOLIC PANEL
BUN/Creatinine Ratio: 24 — ABNORMAL HIGH (ref 9–23)
BUN: 42 mg/dL — ABNORMAL HIGH (ref 6–24)
CO2: 25 mmol/L (ref 20–29)
Calcium: 9.3 mg/dL (ref 8.7–10.2)
Chloride: 91 mmol/L — ABNORMAL LOW (ref 96–106)
Creatinine, Ser: 1.77 mg/dL — ABNORMAL HIGH (ref 0.57–1.00)
Glucose: 292 mg/dL — ABNORMAL HIGH (ref 65–99)
Potassium: 4.3 mmol/L (ref 3.5–5.2)
Sodium: 139 mmol/L (ref 134–144)
eGFR: 33 mL/min/{1.73_m2} — ABNORMAL LOW (ref >59)

## 2021-06-03 NOTE — Telephone Encounter (Signed)
Patient is returning call to discuss procedure instructions.

## 2021-06-03 NOTE — Telephone Encounter (Signed)
Reviewed procedure/mask/visitor instructions reviewed with patient.  Patient reports her medications are set up weekly by paramedic. Patient reports paramedic set up medications for this week following instructions for holding/taking medications prior to abdominal aortogram 06/07/21 previously reviewed with patient (see 05/27/21 phone note).

## 2021-06-03 NOTE — Telephone Encounter (Addendum)
Pt contacted pre-abdominal aortogram scheduled at [facility] for: Monday June 07, 2021 9:30 AM Verified arrival time and place: [facility]) at: 5:30 AM-pre-procedure hydration   No solid food after midnight prior to cath, clear liquids until 5 AM day of procedure.  Hold: Spironolactone-day before and day of procedure-GFR 33 Torsemide-day before and day of procedure-GFR 33 Eliquis-none starting Saturday 06/05/21 until post procedure Jardiance -AM of procedure Insulin-AM of procedure/1/2 usual Insulin HS prior to procedure  Except hold medications AM meds can be  taken pre-cath with sips of water including: aspirin 81 mg   Confirmed patient has responsible adult to drive home post procedure and be with patient first 24 hours after arriving home: yes  You are allowed ONE visitor in the waiting room during the time you are at the hospital for your procedure. Both you and your visitor must wear a mask once you enter the hospital.   Patient reports does not currently have any symptoms concerning for COVID-19 and no household members with COVID-19 like illness.       Reviewed procedure/mask/visitor instructions with patient.

## 2021-06-07 ENCOUNTER — Encounter (HOSPITAL_COMMUNITY)
Admission: RE | Disposition: A | Discharge status: Home / Self Care | Payer: Self-pay | Source: Home / Self Care | Attending: Cardiovascular Disease

## 2021-06-07 ENCOUNTER — Ambulatory Visit (HOSPITAL_COMMUNITY)
Admission: RE | Admit: 2021-06-07 | Discharge: 2021-06-08 | Disposition: A | Discharge status: Home / Self Care | Payer: Medicare (Managed Care) | Attending: Cardiovascular Disease | Admitting: Cardiovascular Disease

## 2021-06-07 ENCOUNTER — Other Ambulatory Visit: Payer: Self-pay

## 2021-06-07 ENCOUNTER — Encounter (HOSPITAL_BASED_OUTPATIENT_CLINIC_OR_DEPARTMENT_OTHER): Payer: Medicare (Managed Care) | Admitting: Internal Medicine

## 2021-06-07 DIAGNOSIS — E1151 Type 2 diabetes mellitus with diabetic peripheral angiopathy without gangrene: Secondary | ICD-10-CM | POA: Diagnosis not present

## 2021-06-07 DIAGNOSIS — L97419 Non-pressure chronic ulcer of right heel and midfoot with unspecified severity: Secondary | ICD-10-CM | POA: Insufficient documentation

## 2021-06-07 DIAGNOSIS — Z7984 Long term (current) use of oral hypoglycemic drugs: Secondary | ICD-10-CM | POA: Insufficient documentation

## 2021-06-07 DIAGNOSIS — Z794 Long term (current) use of insulin: Secondary | ICD-10-CM | POA: Diagnosis not present

## 2021-06-07 DIAGNOSIS — Z79899 Other long term (current) drug therapy: Secondary | ICD-10-CM | POA: Insufficient documentation

## 2021-06-07 DIAGNOSIS — I70229 Atherosclerosis of native arteries of extremities with rest pain, unspecified extremity: Secondary | ICD-10-CM

## 2021-06-07 DIAGNOSIS — I5042 Chronic combined systolic (congestive) and diastolic (congestive) heart failure: Secondary | ICD-10-CM | POA: Insufficient documentation

## 2021-06-07 DIAGNOSIS — Z20822 Contact with and (suspected) exposure to covid-19: Secondary | ICD-10-CM | POA: Diagnosis not present

## 2021-06-07 DIAGNOSIS — Z888 Allergy status to other drugs, medicaments and biological substances status: Secondary | ICD-10-CM | POA: Insufficient documentation

## 2021-06-07 DIAGNOSIS — L97501 Non-pressure chronic ulcer of other part of unspecified foot limited to breakdown of skin: Secondary | ICD-10-CM | POA: Diagnosis present

## 2021-06-07 DIAGNOSIS — I70234 Atherosclerosis of native arteries of right leg with ulceration of heel and midfoot: Secondary | ICD-10-CM | POA: Diagnosis not present

## 2021-06-07 DIAGNOSIS — F17210 Nicotine dependence, cigarettes, uncomplicated: Secondary | ICD-10-CM | POA: Diagnosis not present

## 2021-06-07 DIAGNOSIS — I70211 Atherosclerosis of native arteries of extremities with intermittent claudication, right leg: Secondary | ICD-10-CM

## 2021-06-07 DIAGNOSIS — E11621 Type 2 diabetes mellitus with foot ulcer: Secondary | ICD-10-CM | POA: Diagnosis not present

## 2021-06-07 DIAGNOSIS — E782 Mixed hyperlipidemia: Secondary | ICD-10-CM | POA: Insufficient documentation

## 2021-06-07 DIAGNOSIS — Z7901 Long term (current) use of anticoagulants: Secondary | ICD-10-CM | POA: Insufficient documentation

## 2021-06-07 HISTORY — DX: Atherosclerosis of native arteries of extremities with rest pain, unspecified extremity: I70.229

## 2021-06-07 HISTORY — PX: PERIPHERAL VASCULAR ATHERECTOMY: CATH118256

## 2021-06-07 HISTORY — PX: ABDOMINAL AORTOGRAM W/LOWER EXTREMITY: CATH118223

## 2021-06-07 LAB — POCT ACTIVATED CLOTTING TIME
Activated Clotting Time: 254 seconds
Activated Clotting Time: 260 seconds
Activated Clotting Time: 277 seconds
Activated Clotting Time: 219 seconds
Activated Clotting Time: 179 seconds

## 2021-06-07 LAB — SARS CORONAVIRUS 2 (TAT 6-24 HRS): SARS Coronavirus 2: NEGATIVE

## 2021-06-07 LAB — GLUCOSE, CAPILLARY
Glucose-Capillary: 257 mg/dL — ABNORMAL HIGH (ref 70–99)
Glucose-Capillary: 235 mg/dL — ABNORMAL HIGH (ref 70–99)
Glucose-Capillary: 370 mg/dL — ABNORMAL HIGH (ref 70–99)

## 2021-06-07 SURGERY — ABDOMINAL AORTOGRAM W/LOWER EXTREMITY
Anesthesia: LOCAL | Laterality: Right

## 2021-06-07 MED ORDER — FLUOXETINE HCL 10 MG PO CAPS
10.0000 mg | ORAL_CAPSULE | Freq: Every day | ORAL | Status: DC
  Administered 2021-06-07: 10 mg via ORAL
  Filled 2021-06-07: qty 1

## 2021-06-07 MED ORDER — ASPIRIN EC 81 MG PO TBEC
81.0000 mg | DELAYED_RELEASE_TABLET | Freq: Every day | ORAL | Status: DC
  Administered 2021-06-08: 81 mg via ORAL
  Filled 2021-06-07: qty 1

## 2021-06-07 MED ORDER — DULAGLUTIDE 0.75 MG/0.5ML ~~LOC~~ SOAJ: 0.7500 mg | SUBCUTANEOUS | Status: DC

## 2021-06-07 MED ORDER — SODIUM CHLORIDE 0.9% FLUSH
3.0000 mL | Freq: Two times a day (BID) | INTRAVENOUS | Status: DC
  Administered 2021-06-07: 3 mL via INTRAVENOUS

## 2021-06-07 MED ORDER — LIDOCAINE HCL (PF) 1 % IJ SOLN
INTRAMUSCULAR | Status: AC
  Filled 2021-06-07: qty 30

## 2021-06-07 MED ORDER — BISOPROLOL FUMARATE 5 MG PO TABS: 5.0000 mg | ORAL_TABLET | Freq: Every day | ORAL | Status: DC

## 2021-06-07 MED ORDER — INSULIN LISPRO (1 UNIT DIAL) 100 UNIT/ML (KWIKPEN): 8.0000 [IU] | PEN_INJECTOR | Freq: Three times a day (TID) | SUBCUTANEOUS | Status: DC

## 2021-06-07 MED ORDER — INSULIN GLARGINE 100 UNIT/ML ~~LOC~~ SOLN
10.0000 [IU] | Freq: Every day | SUBCUTANEOUS | Status: DC
  Administered 2021-06-07 – 2021-06-08 (×2): 10 [IU] via SUBCUTANEOUS
  Filled 2021-06-07 (×3): qty 0.1

## 2021-06-07 MED ORDER — CLOPIDOGREL BISULFATE 75 MG PO TABS
75.0000 mg | ORAL_TABLET | Freq: Every day | ORAL | Status: DC
  Administered 2021-06-08: 75 mg via ORAL
  Filled 2021-06-07: qty 1

## 2021-06-07 MED ORDER — HEPARIN SODIUM (PORCINE) 1000 UNIT/ML IJ SOLN
INTRAMUSCULAR | Status: AC
  Filled 2021-06-07: qty 1

## 2021-06-07 MED ORDER — SODIUM CHLORIDE 0.9 % WEIGHT BASED INFUSION
3.0000 mL/kg/h | INTRAVENOUS | Status: DC
  Administered 2021-06-07: 3 mL/kg/h via INTRAVENOUS

## 2021-06-07 MED ORDER — FENTANYL CITRATE (PF) 100 MCG/2ML IJ SOLN
INTRAMUSCULAR | Status: DC | PRN
  Administered 2021-06-07 (×3): 25 ug via INTRAVENOUS

## 2021-06-07 MED ORDER — HYDRALAZINE HCL 25 MG PO TABS
25.0000 mg | ORAL_TABLET | Freq: Three times a day (TID) | ORAL | Status: DC
  Administered 2021-06-07 – 2021-06-08 (×3): 25 mg via ORAL
  Filled 2021-06-07 (×3): qty 1

## 2021-06-07 MED ORDER — SODIUM CHLORIDE 0.9 % IV SOLN: INTRAVENOUS | Status: DC

## 2021-06-07 MED ORDER — NORTRIPTYLINE HCL 25 MG PO CAPS
50.0000 mg | ORAL_CAPSULE | Freq: Two times a day (BID) | ORAL | Status: DC
  Administered 2021-06-07 – 2021-06-08 (×2): 50 mg via ORAL
  Filled 2021-06-07 (×2): qty 2

## 2021-06-07 MED ORDER — LEVOTHYROXINE SODIUM 50 MCG PO TABS
50.0000 ug | ORAL_TABLET | Freq: Every day | ORAL | Status: DC
  Administered 2021-06-08 (×2): 50 ug via ORAL
  Filled 2021-06-07: qty 1

## 2021-06-07 MED ORDER — LEVOTHYROXINE SODIUM 50 MCG PO TABS: 50.0000 ug | ORAL_TABLET | Freq: Every day | ORAL | Status: DC

## 2021-06-07 MED ORDER — MIDAZOLAM HCL 2 MG/2ML IJ SOLN
INTRAMUSCULAR | Status: AC
  Filled 2021-06-07: qty 2

## 2021-06-07 MED ORDER — FENTANYL CITRATE (PF) 100 MCG/2ML IJ SOLN
INTRAMUSCULAR | Status: AC
  Filled 2021-06-07: qty 2

## 2021-06-07 MED ORDER — ACETAMINOPHEN 325 MG PO TABS
650.0000 mg | ORAL_TABLET | ORAL | Status: DC | PRN
  Administered 2021-06-07 – 2021-06-08 (×2): 650 mg via ORAL
  Filled 2021-06-07 (×2): qty 2

## 2021-06-07 MED ORDER — HYDRALAZINE HCL 20 MG/ML IJ SOLN
5.0000 mg | INTRAMUSCULAR | Status: DC | PRN
  Filled 2021-06-07: qty 1

## 2021-06-07 MED ORDER — SODIUM CHLORIDE 0.9% FLUSH: 3.0000 mL | INTRAVENOUS | Status: DC | PRN

## 2021-06-07 MED ORDER — PANTOPRAZOLE SODIUM 40 MG PO TBEC
40.0000 mg | DELAYED_RELEASE_TABLET | Freq: Every day | ORAL | Status: DC
  Administered 2021-06-07 – 2021-06-08 (×2): 40 mg via ORAL
  Filled 2021-06-07 (×2): qty 1

## 2021-06-07 MED ORDER — HEPARIN (PORCINE) IN NACL 1000-0.9 UT/500ML-% IV SOLN
INTRAVENOUS | Status: AC
  Filled 2021-06-07: qty 1000

## 2021-06-07 MED ORDER — ONDANSETRON HCL 4 MG/2ML IJ SOLN
INTRAMUSCULAR | Status: AC
  Filled 2021-06-07: qty 2

## 2021-06-07 MED ORDER — INSULIN ASPART 100 UNIT/ML IJ SOLN
8.0000 [IU] | Freq: Three times a day (TID) | INTRAMUSCULAR | Status: DC
  Administered 2021-06-08: 8 [IU] via SUBCUTANEOUS

## 2021-06-07 MED ORDER — HEPARIN (PORCINE) IN NACL 1000-0.9 UT/500ML-% IV SOLN
INTRAVENOUS | Status: DC | PRN
Start: 2021-06-07 — End: 2021-06-07
  Administered 2021-06-07: 500 mL

## 2021-06-07 MED ORDER — LIDOCAINE HCL (PF) 1 % IJ SOLN
INTRAMUSCULAR | Status: DC | PRN
  Administered 2021-06-07: 30 mL

## 2021-06-07 MED ORDER — HEPARIN (PORCINE) IN NACL 1000-0.9 UT/500ML-% IV SOLN
INTRAVENOUS | Status: DC | PRN
  Administered 2021-06-07: 500 mL

## 2021-06-07 MED ORDER — APIXABAN 5 MG PO TABS
5.0000 mg | ORAL_TABLET | Freq: Two times a day (BID) | ORAL | Status: DC
  Administered 2021-06-07 – 2021-06-08 (×2): 5 mg via ORAL
  Filled 2021-06-07 (×2): qty 1

## 2021-06-07 MED ORDER — SODIUM CHLORIDE 0.9 % WEIGHT BASED INFUSION: 1.0000 mL/kg/h | INTRAVENOUS | Status: DC

## 2021-06-07 MED ORDER — ACETAMINOPHEN 325 MG PO TABS
ORAL_TABLET | ORAL | Status: AC
  Filled 2021-06-07: qty 2

## 2021-06-07 MED ORDER — LABETALOL HCL 5 MG/ML IV SOLN: 10.0000 mg | INTRAVENOUS | Status: DC | PRN

## 2021-06-07 MED ORDER — SODIUM CHLORIDE 0.9% FLUSH
3.0000 mL | Freq: Two times a day (BID) | INTRAVENOUS | Status: DC
  Administered 2021-06-08: 3 mL via INTRAVENOUS

## 2021-06-07 MED ORDER — SODIUM CHLORIDE 0.9 % IV SOLN: 250.0000 mL | INTRAVENOUS | Status: DC | PRN

## 2021-06-07 MED ORDER — MIDAZOLAM HCL 2 MG/2ML IJ SOLN
INTRAMUSCULAR | Status: DC | PRN
  Administered 2021-06-07: 1 mg via INTRAVENOUS

## 2021-06-07 MED ORDER — FOLIC ACID 1 MG PO TABS
1.0000 mg | ORAL_TABLET | Freq: Every day | ORAL | Status: DC
  Administered 2021-06-07 – 2021-06-08 (×2): 1 mg via ORAL
  Filled 2021-06-07 (×2): qty 1

## 2021-06-07 MED ORDER — AMLODIPINE BESYLATE 10 MG PO TABS
10.0000 mg | ORAL_TABLET | Freq: Every day | ORAL | Status: DC
  Administered 2021-06-07 – 2021-06-08 (×2): 10 mg via ORAL
  Filled 2021-06-07 (×2): qty 1

## 2021-06-07 MED ORDER — ATORVASTATIN CALCIUM 40 MG PO TABS
40.0000 mg | ORAL_TABLET | Freq: Every evening | ORAL | Status: DC
  Administered 2021-06-07: 40 mg via ORAL
  Filled 2021-06-07 (×2): qty 1

## 2021-06-07 MED ORDER — TORSEMIDE 20 MG PO TABS
80.0000 mg | ORAL_TABLET | Freq: Two times a day (BID) | ORAL | Status: DC
  Administered 2021-06-07 – 2021-06-08 (×2): 80 mg via ORAL
  Filled 2021-06-07 (×3): qty 4

## 2021-06-07 MED ORDER — SODIUM CHLORIDE 0.9 % IV SOLN
INTRAVENOUS | Status: AC
  Administered 2021-06-07: 75 mL/h via INTRAVENOUS

## 2021-06-07 MED ORDER — EMPAGLIFLOZIN 10 MG PO TABS
10.0000 mg | ORAL_TABLET | Freq: Every day | ORAL | Status: DC
  Administered 2021-06-08: 10 mg via ORAL
  Filled 2021-06-07: qty 1

## 2021-06-07 MED ORDER — BISOPROLOL FUMARATE 5 MG PO TABS
5.0000 mg | ORAL_TABLET | Freq: Every day | ORAL | Status: DC
  Administered 2021-06-08: 5 mg via ORAL
  Filled 2021-06-07: qty 1

## 2021-06-07 MED ORDER — HEPARIN SODIUM (PORCINE) 1000 UNIT/ML IJ SOLN
INTRAMUSCULAR | Status: DC | PRN
  Administered 2021-06-07: 10000 [IU] via INTRAVENOUS
  Administered 2021-06-07 (×2): 3000 [IU] via INTRAVENOUS

## 2021-06-07 MED ORDER — ASPIRIN 81 MG PO CHEW: 81.0000 mg | CHEWABLE_TABLET | ORAL | Status: DC

## 2021-06-07 MED ORDER — SPIRONOLACTONE 25 MG PO TABS
25.0000 mg | ORAL_TABLET | Freq: Every day | ORAL | Status: DC
  Administered 2021-06-07 – 2021-06-08 (×2): 25 mg via ORAL
  Filled 2021-06-07 (×2): qty 1

## 2021-06-07 MED ORDER — ALBUTEROL SULFATE HFA 108 (90 BASE) MCG/ACT IN AERS: 2.0000 | INHALATION_SPRAY | Freq: Four times a day (QID) | RESPIRATORY_TRACT | Status: DC | PRN

## 2021-06-07 MED ORDER — CLOPIDOGREL BISULFATE 300 MG PO TABS
ORAL_TABLET | ORAL | Status: DC | PRN
  Administered 2021-06-07: 300 mg via ORAL

## 2021-06-07 MED ORDER — ONDANSETRON HCL 4 MG/2ML IJ SOLN
4.0000 mg | Freq: Four times a day (QID) | INTRAMUSCULAR | Status: DC | PRN
  Administered 2021-06-07 – 2021-06-08 (×2): 4 mg via INTRAVENOUS
  Filled 2021-06-07: qty 2

## 2021-06-07 MED ORDER — ACETAMINOPHEN 500 MG PO TABS: 500.0000 mg | ORAL_TABLET | Freq: Four times a day (QID) | ORAL | Status: DC | PRN

## 2021-06-07 MED ORDER — MORPHINE SULFATE (PF) 2 MG/ML IV SOLN: 2.0000 mg | INTRAVENOUS | Status: DC | PRN

## 2021-06-07 MED ORDER — COLCHICINE 0.6 MG PO TABS
0.6000 mg | ORAL_TABLET | Freq: Every day | ORAL | Status: DC
  Administered 2021-06-07 – 2021-06-08 (×2): 0.6 mg via ORAL
  Filled 2021-06-07 (×2): qty 1

## 2021-06-07 MED ORDER — AMIODARONE HCL 200 MG PO TABS
100.0000 mg | ORAL_TABLET | Freq: Every day | ORAL | Status: DC
  Administered 2021-06-07 – 2021-06-08 (×2): 100 mg via ORAL
  Filled 2021-06-07 (×2): qty 1

## 2021-06-07 MED ORDER — ALBUTEROL SULFATE (2.5 MG/3ML) 0.083% IN NEBU: 2.5000 mg | INHALATION_SOLUTION | Freq: Four times a day (QID) | RESPIRATORY_TRACT | Status: DC | PRN

## 2021-06-07 SURGICAL SUPPLY — 28 items
BAG SNAP BAND KOVER 36X36 (MISCELLANEOUS) ×3 IMPLANT
BALLN ADMIRAL INPACT 5X200 (BALLOONS) ×3
BALLN JADE .014 2.5 X 150 (BALLOONS) ×3
BALLOON ADMIRAL INPACT 5X200 (BALLOONS) ×2 IMPLANT
BALLOON JADE .014 2.5 X 150 (BALLOONS) ×2 IMPLANT
CATH ANGIO 5F PIGTAIL 65CM (CATHETERS) ×3 IMPLANT
CATH CROSS OVER TEMPO 5F (CATHETERS) ×3 IMPLANT
CATH HAWKONE LX EXTENDED TIP (CATHETERS) ×3 IMPLANT
CATH STRAIGHT 5FR 65CM (CATHETERS) ×3 IMPLANT
CATH VIANCE CROSS STAND 150CM (MICROCATHETER) ×3
CATH VIANCE CROSS STD 150CM (MICROCATHETER) ×2 IMPLANT
DEVICE SPIDERFX EMB PROT 6MM (WIRE) ×3 IMPLANT
GUIDEWIRE ANGLED .035X150CM (WIRE) ×3 IMPLANT
GUIDEWIRE ZILIENT 6G 014 (WIRE) ×3 IMPLANT
KIT ENCORE 26 ADVANTAGE (KITS) ×3 IMPLANT
KIT PV (KITS) ×3 IMPLANT
SHEATH HIGHFLEX ANSEL 7FR 55CM (SHEATH) ×3 IMPLANT
SHEATH PINNACLE 5F 10CM (SHEATH) ×3 IMPLANT
SHEATH PINNACLE 7F 10CM (SHEATH) ×3 IMPLANT
STOPCOCK MORSE 400PSI 3WAY (MISCELLANEOUS) ×3 IMPLANT
SYR MEDRAD MARK 7 150ML (SYRINGE) ×3 IMPLANT
TAPE VIPERTRACK RADIOPAQ (MISCELLANEOUS) ×2 IMPLANT
TAPE VIPERTRACK RADIOPAQUE (MISCELLANEOUS) ×1
TRANSDUCER W/STOPCOCK (MISCELLANEOUS) ×3 IMPLANT
TRAY PV CATH (CUSTOM PROCEDURE TRAY) ×3 IMPLANT
TUBING CIL FLEX 10 FLL-RA (TUBING) ×3 IMPLANT
WIRE HITORQ VERSACORE ST 145CM (WIRE) ×3 IMPLANT
WIRE ROSEN-J .035X260CM (WIRE) ×3 IMPLANT

## 2021-06-07 NOTE — Progress Notes (Addendum)
Site area: Left groin a 7 french arterial sheath was removed by Lurene Shadow [facility]  Site Prior to Removal:  Level 0  Pressure Applied For 30 MINUTES    Bedrest Beginning at 1545p X4 hours  Manual:   Yes.    Patient Status During Pull:  stable  Post Pull Groin Site:  Level 0  Post Pull Instructions Given:  Yes.    Post Pull Pulses Present:  Yes.    Dressing Applied:  Yes.    Comments:

## 2021-06-07 NOTE — Progress Notes (Signed)
HS CBG 370. Pt without HS correction coverage.  Notified Dr. Blossom Hoops with cardiology for orders.  Awaiting follow up from provider.

## 2021-06-07 NOTE — Plan of Care (Signed)
  Problem: Education: Goal: Knowledge of General Education information will improve Description: Including pain rating scale, medication(s)/side effects and non-pharmacologic comfort measures Outcome: Progressing   Problem: Health Behavior/Discharge Planning: Goal: Ability to manage health-related needs will improve Outcome: Progressing   Problem: Clinical Measurements: Goal: Ability to maintain clinical measurements within normal limits will improve Outcome: Progressing Goal: Will remain free from infection Outcome: Progressing Goal: Diagnostic test results will improve Outcome: Progressing Goal: Respiratory complications will improve Outcome: Progressing Goal: Cardiovascular complication will be avoided Outcome: Progressing   Problem: Activity: Goal: Risk for activity intolerance will decrease Outcome: Progressing   Problem: Nutrition: Goal: Adequate nutrition will be maintained Outcome: Progressing   Problem: Coping: Goal: Level of anxiety will decrease Outcome: Progressing   Problem: Elimination: Goal: Will not experience complications related to bowel motility Outcome: Progressing Goal: Will not experience complications related to urinary retention Outcome: Progressing   Problem: Pain Managment: Goal: General experience of comfort will improve Outcome: Progressing   Problem: Safety: Goal: Ability to remain free from injury will improve Outcome: Progressing   Problem: Skin Integrity: Goal: Risk for impaired skin integrity will decrease Outcome: Progressing   Problem: Education: Goal: Understanding of CV disease, CV risk reduction, and recovery process will improve Outcome: Progressing Goal: Individualized Educational Video(s) Outcome: Progressing   Problem: Activity: Goal: Ability to return to baseline activity level will improve Outcome: Progressing   Problem: Cardiovascular: Goal: Ability to achieve and maintain adequate cardiovascular perfusion  will improve Outcome: Progressing Goal: Vascular access site(s) Level 0-1 will be maintained Outcome: Progressing   Problem: Health Behavior/Discharge Planning: Goal: Ability to safely manage health-related needs after discharge will improve Outcome: Progressing   

## 2021-06-07 NOTE — Interval H&P Note (Signed)
History and Physical Interval Note:  06/07/2021 10:15 AM  [name]  has presented today for surgery, with the diagnosis of pad.  The various methods of treatment have been discussed with the patient and family. After consideration of risks, benefits and other options for treatment, the patient has consented to  Procedure(s): ABDOMINAL AORTOGRAM W/LOWER EXTREMITY (N/A) as a surgical intervention.  The patient's history has been reviewed, patient examined, no change in status, stable for surgery.  I have reviewed the patient's chart and labs.  Questions were answered to the patient's satisfaction.     Quay Burow

## 2021-06-07 NOTE — Progress Notes (Signed)
6f sheath aspirated and removed from right femoral artery. Manual pressure applied for 20 minutes. Site rebled. Manual pressure reapplied for an additional 15 minutes. Site level 0 no S+S of hematoma or additional bleeding. Tegaderm dressing applied, bedrest instructions given.   Left dp and pt pulses present with doppler.   Bedrest begins at 15:45:00

## 2021-06-08 ENCOUNTER — Other Ambulatory Visit (HOSPITAL_COMMUNITY): Payer: Self-pay

## 2021-06-08 ENCOUNTER — Ambulatory Visit (HOSPITAL_COMMUNITY)
Admission: RE | Admit: 2021-06-08 | Payer: Medicare (Managed Care) | Source: Ambulatory Visit | Attending: Cardiovascular Disease | Admitting: Cardiovascular Disease

## 2021-06-08 ENCOUNTER — Encounter (HOSPITAL_COMMUNITY): Payer: Self-pay | Admitting: Cardiovascular Disease

## 2021-06-08 DIAGNOSIS — L97501 Non-pressure chronic ulcer of other part of unspecified foot limited to breakdown of skin: Secondary | ICD-10-CM

## 2021-06-08 DIAGNOSIS — I70229 Atherosclerosis of native arteries of extremities with rest pain, unspecified extremity: Secondary | ICD-10-CM

## 2021-06-08 DIAGNOSIS — E1151 Type 2 diabetes mellitus with diabetic peripheral angiopathy without gangrene: Secondary | ICD-10-CM | POA: Diagnosis not present

## 2021-06-08 LAB — BASIC METABOLIC PANEL
Anion gap: 10 (ref 5–15)
BUN: 28 mg/dL — ABNORMAL HIGH (ref 6–20)
CO2: 28 mmol/L (ref 22–32)
Calcium: 8.6 mg/dL — ABNORMAL LOW (ref 8.9–10.3)
Chloride: 98 mmol/L (ref 98–111)
Creatinine, Ser: 1.6 mg/dL — ABNORMAL HIGH (ref 0.44–1.00)
GFR, Estimated: 37 mL/min — ABNORMAL LOW (ref >60)
Glucose, Bld: 308 mg/dL — ABNORMAL HIGH (ref 70–99)
Potassium: 3.8 mmol/L (ref 3.5–5.1)
Sodium: 136 mmol/L (ref 135–145)

## 2021-06-08 LAB — CBC
HCT: 35 % — ABNORMAL LOW (ref 36.0–46.0)
Hemoglobin: 10.5 g/dL — ABNORMAL LOW (ref 12.0–15.0)
MCH: 27 pg (ref 26.0–34.0)
MCHC: 30 g/dL (ref 30.0–36.0)
MCV: 90 fL (ref 80.0–100.0)
Platelets: 272 10*3/uL (ref 150–400)
RBC: 3.89 MIL/uL (ref 3.87–5.11)
RDW: 15.5 % (ref 11.5–15.5)
WBC: 12 10*3/uL — ABNORMAL HIGH (ref 4.0–10.5)
nRBC: 0 % (ref 0.0–0.2)

## 2021-06-08 LAB — GLUCOSE, CAPILLARY: Glucose-Capillary: 341 mg/dL — ABNORMAL HIGH (ref 70–99)

## 2021-06-08 MED ORDER — CLOPIDOGREL BISULFATE 75 MG PO TABS
75.0000 mg | ORAL_TABLET | Freq: Every day | ORAL | 1 refills | Status: DC
  Filled 2021-06-08: qty 90, 90d supply, fill #0

## 2021-06-08 NOTE — Plan of Care (Signed)
Problem: Education: Goal: Knowledge of General Education information will improve Description: Including pain rating scale, medication(s)/side effects and non-pharmacologic comfort measures 06/08/2021 0821 by Camillia Herter, RN Outcome: Progressing 06/07/2021 1823 by Camillia Herter, RN Outcome: Progressing   Problem: Health Behavior/Discharge Planning: Goal: Ability to manage health-related needs will improve 06/08/2021 0821 by Camillia Herter, RN Outcome: Progressing 06/07/2021 1823 by Camillia Herter, RN Outcome: Progressing   Problem: Clinical Measurements: Goal: Ability to maintain clinical measurements within normal limits will improve 06/08/2021 0821 by Camillia Herter, RN Outcome: Progressing 06/07/2021 1823 by Camillia Herter, RN Outcome: Progressing Goal: Will remain free from infection 06/08/2021 0821 by Camillia Herter, RN Outcome: Progressing 06/07/2021 1823 by Camillia Herter, RN Outcome: Progressing Goal: Diagnostic test results will improve 06/08/2021 0821 by Camillia Herter, RN Outcome: Progressing 06/07/2021 1823 by Camillia Herter, RN Outcome: Progressing Goal: Respiratory complications will improve 06/08/2021 0821 by Camillia Herter, RN Outcome: Progressing 06/07/2021 1823 by Camillia Herter, RN Outcome: Progressing Goal: Cardiovascular complication will be avoided 06/08/2021 0821 by Camillia Herter, RN Outcome: Progressing 06/07/2021 1823 by Camillia Herter, RN Outcome: Progressing   Problem: Activity: Goal: Risk for activity intolerance will decrease 06/08/2021 0821 by Camillia Herter, RN Outcome: Progressing 06/07/2021 1823 by Camillia Herter, RN Outcome: Progressing   Problem: Nutrition: Goal: Adequate nutrition will be maintained 06/08/2021 0821 by Camillia Herter, RN Outcome: Progressing 06/07/2021 1823 by Camillia Herter, RN Outcome: Progressing   Problem: Coping: Goal: Level of anxiety will decrease 06/08/2021 0821 by Camillia Herter, RN Outcome:  Progressing 06/07/2021 1823 by Camillia Herter, RN Outcome: Progressing   Problem: Elimination: Goal: Will not experience complications related to bowel motility 06/08/2021 0821 by Camillia Herter, RN Outcome: Progressing 06/07/2021 1823 by Camillia Herter, RN Outcome: Progressing Goal: Will not experience complications related to urinary retention 06/08/2021 0821 by Camillia Herter, RN Outcome: Progressing 06/07/2021 1823 by Camillia Herter, RN Outcome: Progressing   Problem: Pain Managment: Goal: General experience of comfort will improve 06/08/2021 0821 by Camillia Herter, RN Outcome: Progressing 06/07/2021 1823 by Camillia Herter, RN Outcome: Progressing   Problem: Safety: Goal: Ability to remain free from injury will improve 06/08/2021 0821 by Camillia Herter, RN Outcome: Progressing 06/07/2021 1823 by Camillia Herter, RN Outcome: Progressing   Problem: Skin Integrity: Goal: Risk for impaired skin integrity will decrease 06/08/2021 0821 by Camillia Herter, RN Outcome: Progressing 06/07/2021 1823 by Camillia Herter, RN Outcome: Progressing   Problem: Education: Goal: Understanding of CV disease, CV risk reduction, and recovery process will improve 06/08/2021 0821 by Camillia Herter, RN Outcome: Progressing 06/07/2021 1823 by Camillia Herter, RN Outcome: Progressing Goal: Individualized Educational Video(s) 06/08/2021 0821 by Camillia Herter, RN Outcome: Progressing 06/07/2021 1823 by Camillia Herter, RN Outcome: Progressing   Problem: Activity: Goal: Ability to return to baseline activity level will improve 06/08/2021 0821 by Camillia Herter, RN Outcome: Progressing 06/07/2021 1823 by Camillia Herter, RN Outcome: Progressing   Problem: Cardiovascular: Goal: Ability to achieve and maintain adequate cardiovascular perfusion will improve 06/08/2021 0821 by Camillia Herter, RN Outcome: Progressing 06/07/2021 1823 by Camillia Herter, RN Outcome: Progressing Goal: Vascular access site(s) Level  0-1 will be maintained 06/08/2021 0821 by Camillia Herter, RN Outcome: Progressing 06/07/2021 1823 by Camillia Herter, RN Outcome: Progressing   Problem: Health Behavior/Discharge Planning: Goal: Ability to safely manage health-related needs after  discharge will improve 06/08/2021 0821 by Camillia Herter, RN Outcome: Progressing 06/07/2021 1823 by Camillia Herter, RN Outcome: Progressing

## 2021-06-08 NOTE — Plan of Care (Signed)
Problem: Education: Goal: Knowledge of General Education information will improve Description: Including pain rating scale, medication(s)/side effects and non-pharmacologic comfort measures 06/08/2021 1049 by Camillia Herter, RN Outcome: Adequate for Discharge 06/08/2021 0821 by Camillia Herter, RN Outcome: Progressing   Problem: Health Behavior/Discharge Planning: Goal: Ability to manage health-related needs will improve 06/08/2021 1049 by Camillia Herter, RN Outcome: Adequate for Discharge 06/08/2021 [id number] by Camillia Herter, RN Outcome: Progressing   Problem: Clinical Measurements: Goal: Ability to maintain clinical measurements within normal limits will improve 06/08/2021 1049 by Camillia Herter, RN Outcome: Adequate for Discharge 06/08/2021 [id number] by Camillia Herter, RN Outcome: Progressing Goal: Will remain free from infection 06/08/2021 1049 by Camillia Herter, RN Outcome: Adequate for Discharge 06/08/2021 [phone] by Camillia Herter, RN Outcome: Progressing Goal: Diagnostic test results will improve 06/08/2021 1049 by Camillia Herter, RN Outcome: Adequate for Discharge 06/08/2021 [id number] by Camillia Herter, RN Outcome: Progressing Goal: Respiratory complications will improve 06/08/2021 1049 by Camillia Herter, RN Outcome: Adequate for Discharge 06/08/2021 [phone] by Camillia Herter, RN Outcome: Progressing Goal: Cardiovascular complication will be avoided 06/08/2021 1049 by Camillia Herter, RN Outcome: Adequate for Discharge 06/08/2021 0821 by Camillia Herter, RN Outcome: Progressing   Problem: Activity: Goal: Risk for activity intolerance will decrease 06/08/2021 1049 by Camillia Herter, RN Outcome: Adequate for Discharge 06/08/2021 [phone] by Camillia Herter, RN Outcome: Progressing   Problem: Nutrition: Goal: Adequate nutrition will be maintained 06/08/2021 1049 by Camillia Herter, RN Outcome: Adequate for Discharge 06/08/2021 [phone] by Camillia Herter, RN Outcome: Progressing   Problem:  Coping: Goal: Level of anxiety will decrease 06/08/2021 1049 by Camillia Herter, RN Outcome: Adequate for Discharge 06/08/2021 [phone] by Camillia Herter, RN Outcome: Progressing   Problem: Elimination: Goal: Will not experience complications related to bowel motility 06/08/2021 1049 by Camillia Herter, RN Outcome: Adequate for Discharge 06/08/2021 [phone] by Camillia Herter, RN Outcome: Progressing Goal: Will not experience complications related to urinary retention 06/08/2021 1049 by Camillia Herter, RN Outcome: Adequate for Discharge 06/08/2021 [phone] by Camillia Herter, RN Outcome: Progressing   Problem: Pain Managment: Goal: General experience of comfort will improve 06/08/2021 1049 by Camillia Herter, RN Outcome: Adequate for Discharge 06/08/2021 [id number] by Camillia Herter, RN Outcome: Progressing   Problem: Safety: Goal: Ability to remain free from injury will improve 06/08/2021 1049 by Camillia Herter, RN Outcome: Adequate for Discharge 06/08/2021 [id number] by Camillia Herter, RN Outcome: Progressing   Problem: Skin Integrity: Goal: Risk for impaired skin integrity will decrease 06/08/2021 1049 by Camillia Herter, RN Outcome: Adequate for Discharge 06/08/2021 [id number] by Camillia Herter, RN Outcome: Progressing   Problem: Education: Goal: Understanding of CV disease, CV risk reduction, and recovery process will improve 06/08/2021 1049 by Camillia Herter, RN Outcome: Adequate for Discharge 06/08/2021 0821 by Camillia Herter, RN Outcome: Progressing Goal: Individualized Educational Video(s) 06/08/2021 1049 by Camillia Herter, RN Outcome: Adequate for Discharge 06/08/2021 [phone] by Camillia Herter, RN Outcome: Progressing   Problem: Activity: Goal: Ability to return to baseline activity level will improve 06/08/2021 1049 by Camillia Herter, RN Outcome: Adequate for Discharge 06/08/2021 [phone] by Camillia Herter, RN Outcome: Progressing   Problem: Cardiovascular: Goal: Ability to achieve and maintain adequate  cardiovascular perfusion will improve 06/08/2021 1049 by Camillia Herter, RN Outcome: Adequate for Discharge 06/08/2021 0821 by Camillia Herter, RN Outcome: Progressing Goal: Vascular access  site(s) Level 0-1 will be maintained 06/08/2021 1049 by Camillia Herter, RN Outcome: Adequate for Discharge 06/08/2021 [phone] by Camillia Herter, RN Outcome: Progressing   Problem: Health Behavior/Discharge Planning: Goal: Ability to safely manage health-related needs after discharge will improve 06/08/2021 1049 by Camillia Herter, RN Outcome: Adequate for Discharge 06/08/2021 0821 by Camillia Herter, RN Outcome: Progressing

## 2021-06-08 NOTE — Discharge Summary (Addendum)
The patient has been seen in conjunction with Reino Bellis, NP. All aspects of care have been considered and discussed. The patient has been personally interviewed, examined, and all clinical data has been reviewed.  Had procedure described yesterday. No overnight problems Both feet feel better. Ambulated. Femoral cath site without hematoma. Discharge today with f/u and imaging as described.   Discharge Summary    Patient ID: [name] MRN: [id number]; DOB: [birth year]  Admit date: 06/07/2021 Discharge date: 06/08/2021  PCP:  [facility] Cardiologist:  Quay Burow, MD  Electrophysiologist:  [name], MD  {  Discharge Diagnoses    Active Problems:   Ulcer of foot, unspecified laterality, limited to breakdown of skin [facility])   Critical ischemia of foot [facility])  Diagnostic Studies/Procedures    PV angiogram: 06/07/21  Final Impression: Successful Hawk 1 directional atherectomy followed by drug-coated balloon angioplasty of a right SFA CTO in the setting of critical ischemia with 0 vessel runoff.  This is the third time I revascularized her right SFA CTO, first in 2016 and then in 2018.  She does continue to smoke.  She is on Eliquis for PAF.  She can be on Plavix and Eliquis, ask if aspirin can be discontinued.  She will be hydrated overnight and her renal function followed carefully given her renal insufficiency.  We will obtain lower extremity arterial Doppler studies in our [facility] line office next week and I will see her back 1 to 2 weeks thereafter.  She left the lab in stable condition.       Quay Burow. MD, [facility] 06/07/2021 12:29 PM _____________   History of Present Illness     [name] is a 60 y.o. 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   She was admitted June 20 through May 05 2021 for acute on chronic heart failure. She was diuresed with IV Lasix.  Her losartan was held until follow-up with cardiology due to AKI.     Her last PAD studies: - Carotid duplex 05/2020 showing total occlusion of the RICA, mild ICA stenosis s/p CEA.  Rec: repeat in 1 year. - Vascular US of LE bypass graft 12/2019: patent fem-ATA bypass graft with no stenosis - ABIs 12/2019: resting right ABIs indicate severe R lower arterial disease.  Resting left ABIs are within normal range   She was seen in the clinic on 05/19/21 and stated she has had a "hole" on the bottom of her R foot x 3-4 weeks.  Chart review, showed last wound clinic appt 04/05/2021 when she had this area debrided.  She missed her wound clinic appt 6/21.  She was given a special shoe to avoid putting pressure on this ulcer, but she states she tripped twice and has stopped wearing it.  She was still smoking 1 pack/day (smoking since 60 years old).  She mentioned not being able to see her DM doctor because she has a delayed payment.  Her A1C is 9.7%.  During her recent hospitalization, she was prescribed Keflex for RLE cellulitis.  She described significant pain in both legs from the knee down.  Stated that it felt like she was walking on pins and needles/ rocks and has  foot numbness.  She has a squeezing pain all the time, worse with walking and leg elevation.     She was currently living with her sister.  She uses a cane, walking and sometimes an electric scooter to get around.  She has a hospital bed at home.  Relating to her other medical conditions, she denies TIA/CVA symptoms, SOB, CP and lightheadedness.  She sometimes has indigestion/burp sensation.  She sometimes forgets to transmit her cardiomems.  Given her worsening PV concerns she was set up for outpatient PV  angiogram.  Hospital Course     PAD: Underwent successful atherectomy and drug-coated balloon angioplasty of the right SFA CTO in the setting of critical ischemia.  Recommendations to start Plavix as monotherapy given the need for Eliquis.  No complications noted post cath.  She was observed overnight with IV hydration.  Continued on home medications without significant change other than the addition of Plavix.  Of note she is on already gets 10 mg daily, current GFR 37.  We will need to monitor closely in the future.  She is currently followed in the wound clinic for her right foot.   General: Well developed, well nourished, female appearing in no acute distress. Head: Normocephalic, atraumatic.  Neck: Supple without bruits, JVD. Lungs:  Resp regular and unlabored, CTA. Heart: RRR, S1, S2, no S3, S4, or murmur; no rub. Abdomen: Soft, non-tender, non-distended with normoactive bowel sounds. No hepatomegaly. No rebound/guarding. No obvious abdominal masses. Extremities: No clubbing, cyanosis, edema. Distal pedal pulses are 2+ bilaterally. Left femoral cath site stable with very mild bruising, no hematoma. Right LE wrapped.  Neuro: Alert and oriented X 3. Moves all extremities spontaneously. Psych: Normal affect.  Did the patient have an acute coronary syndrome (MI, NSTEMI, STEMI, etc) this admission?:  No                               Did the patient have a percutaneous coronary intervention (stent / angioplasty)?:  No.       _____________  Discharge Vitals Blood pressure (!) 170/59, pulse 61, temperature 97.7 F (36.5 C), temperature source Oral, resp. rate 16, height _0  (1.626 m), weight 100.7 kg, SpO2 97 %.  Filed Weights   06/07/21 0609  Weight: 100.7 kg    Labs & Radiologic Studies    CBC Recent Labs    06/08/21 0228  WBC 12.0*  HGB 10.5*  HCT 35.0*  MCV 90.0  PLT 045   Basic Metabolic Panel Recent Labs    06/08/21 0228  NA 136  K 3.8  CL 98  CO2 28  GLUCOSE  308*  BUN 28*  CREATININE 1.60*  CALCIUM 8.6*   Liver Function Tests No results for input(s): AST, ALT, ALKPHOS, BILITOT, PROT, ALBUMIN in the last 72 hours. No results for input(s): LIPASE, AMYLASE in the last 72 hours. High Sensitivity Troponin:   No results for input(s): TROPONINIHS in the last 720 hours.  BNP Invalid input(s): POCBNP D-Dimer No results for input(s): DDIMER in the last 72 hours. Hemoglobin A1C No results for input(s): HGBA1C in the last 72 hours. Fasting Lipid Panel No results for input(s): CHOL, HDL, LDLCALC, TRIG, CHOLHDL, LDLDIRECT in the last 72 hours. Thyroid Function Tests No results for input(s): TSH, T4TOTAL, T3FREE, THYROIDAB in the last 72 hours.  Invalid input(s): FREET3 _____________  PERIPHERAL VASCULAR CATHETERIZATION  Result Date: 06/07/2021 Formatting of this result is different from the  original. Images from the original result were not included.  [id number] LOCATION:  FACILITY: [facility] PHYSICIAN: Quay Burow, M.D. 08/10/1961 DATE OF PROCEDURE:  06/07/2021 DATE OF DISCHARGE: PV Angiogram/Intervention History obtained from chart review.ASHLEN KIGER is a 60y.o.  moderately overweight single Caucasian female mother of one child who lives with her sister. She was originally referred by Dr. Franki Monte at [facility] for peripheral vascular evaluation because of critical limb ischemia. I last saw her in the office  12/25/2018. She sees Dr. Jenkins Rouge at [facility] for cardiomyopathy and paroxysmal atrial fibrillation. Her cardiac risk factors include a 20-pack-year history of tobacco abuse (she has stopped smoking), treated diabetes, and hypertension as well as family history of heart disease. She's never had a heart attack or stroke. She does have COPD. She had paroxysmal atrial fibrillation in the past and has undergone cardioversion in [place].. The lower extremity arterial Doppler studies performed in our office suggested critical limb  ischemia with an occluded SFA and popliteal arteries bilaterally with tibial vessel disease as well. She complains of lifestyle limiting claudication. I performed lower extremity angiography on 09/10/13 revealing occluded SFAs bilaterally with chronic occluded tibial vessels as well. I performed TurboHawk  directional atherectomy of her entire right SFA with an excellent angiographic result removing a copious amount of atherosclerotic plaque. The ulcer on her right heel ultimately healed. She does continue to smoke one pack per day. She was recently admitted to [facility] because of atrial fibrillation with rapid ventricular response. She underwent cardiac catheterization on 10/31/14 revealing an ejection fraction of 30-35% with disease that was ultimately treated medically. Her carotid Doppler showed high-grade bilateral ICA disease and lower extremity Dopplers revealed restenosis within the right SFA with a ABI of 0.34. She is symptomatic on that side. I performed angiography on her 01/15/15 revealing an occluded right internal carotid artery, 95% proximal left internal carotid artery stenosis, long 99% stenosis within the right SFA and an occluded left SFA. I performed [facility] 1  directional atherectomy and drug-eluting balloon angioplasty of her right SFA with an excellent  Angiographic and clinical result. She underwent left carotid endarterectomy by Dr. Trula Slade on 02/19/15 which was uncomplicated. Since I saw her 6 months ago she's been remaining clinically stable. She denies chest pain or shortness of breath. She continues to have claudication.Lower extremity Doppler studies performed 01/23/15 revealed ABIs in the 0.6 range bilaterally with a patent right SFA. She continues to complain of claudication however.recent Dopplers performed 12/22/15 revealed a right TBI 0. 17. She was admitted to the hospital with altered mental status and sepsis 04/06/17 for one week. 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 I suspect performed angiography of her 04/13/17 revealing an occluded right SFA was 0 vessel runoff. Atherectomy I's are entire right SFA and she subsequently underwent right second ray resection by Dr. Sharol Given to on 04/28/17. She has stopped smoking since that hospitalization. She also underwent a right transmetatarsal amputation by Dr. Sharol Given which has subsequently healed. Recent lower extremity Dopplers performed 05/29/17 revealed a right ABI 0.65 with a patent right SFA and a left ABI 0.31.   She does have diastolic heart failure followed by Dr. Aundra Dubin in the heart failure clinic.  She has had a CardioMEMS device implanted to follow her pulmonary pressures and lung impedance..  She does admit to dietary indiscretion.  She was diuresed 30 pounds.  She is aware of salt restriction.  She is on oral  diuretics and weighs herself on a daily basis.  She denies claudication but does have pain in her feet suggestive of diabetic peripheral neuropathy.  She was admitted to the hospital in August of last year with an infected diabetic left foot ulcer and ultimately underwent left fem-tib bypass grafting by Dr. Sherren Mocha early 06/28/2019 which he follows by duplex ultrasound on outpatient basis.  She unfortunately is gone back to smoking half a pack per day.  She denies chest pain or shortness of breath.  She is aware of salt restriction. She was seen in our office by Caron Presume, PA-C with a nonhealing wound on her right heel.  She presents now for angiography and potential endovascular therapy for limb salvage. Pre Procedure Diagnosis: Critical limb ischemia Post Procedure Diagnosis: Critical limb ischemia Operators: Dr. Quay Burow Procedures Performed:  1.  Ultrasound-guided left common femoral access  2.  Abdominal aortogram/bilateral iliac angiogram/right lower extremity runoff  3.  PTA right SFA CTO, placement of spider distal protection device in right above-the-knee popliteal artery  4.  Hawk 1 directional  atherectomy followed by drug-coated balloon angioplasty right SFA CTO, completion angiography PROCEDURE DESCRIPTION: The patient was brought to the second floor [facility] Cardiac cath lab in the the postabsorptive state. She was premedicated with IV Versed and fentanyl. Her left groin was prepped and shaved in usual sterile fashion. Xylocaine 1% was used for local anesthesia. A 5 French sheath was inserted into the left common femoral artery using standard Seldinger technique.  A 5 French pigtail catheter was placed in the distal abdominal aorta.  Distal abdominal aortography and bilateral iliac angiography were performed.  Contralateral access was obtained with a crossover catheter, endhole catheter along with a Glidewire (second order catheter placement).  Right lower extremity angiography with runoff was performed using bolus chase, digital subtraction and step table technique.  Omnipaque dye was used for the entirety of the case (total of 170 cc was administered patient).  Retrograde aortic pressures monitored during the case.  Angiographic Data: 1: Abdominal aorta-widely patent 2: Left lower extremity-the left common and extra iliac artery as well as common femoral artery were widely patent.  The left SFA was occluded.  The femoropopliteal bypass graft was visualized by hand-injection noted to be patent. 3: Right lower extremity-occluded right SFA in the midportion over a fairly long distance with reconstitution in the adductor canal by profunda femoris collaterals.  The popliteal and tibial vessels were occluded as well.  The peroneal vessel fills by collaterals and filled the PT and DP but by communicating collaterals.   Ms. Adam Phenix has a right SFA CTO with 0 vessel runoff and a nonhealing wound on her right heel.  She does have chronic renal sufficiency with a serum creatinine in the 1.7 range.  We will proceed with right SFA directional atherectomy followed by drug-coated balloon angioplasty for limb  salvage Procedure Description: A 7 French 55 cm multipurpose Ansell sheath was then advanced over a versa core wire into the right common femoral artery.  The patient received a total of 16,000's of heparin with an ending ACT of 277. I was able to cross the CTO with a Viance CTO catheter along with 0.14 6 g Zilient  wire.  I then performed PTA with a 2.5 mm x 150 mm long jade balloon.  I placed a 6 mm spider distal protection device in the right above-the-knee popliteal artery.  Following this I performed directional atherectomy with an LX device performing multiple circumferential cuts  and retrieving a copious amount of atherosclerotic plaque.  Following this I performed drug-coated balloon angioplasty with a 5 mm x 200 mm long impact Admiral balloon at 4 atm for 3 minutes resulting in reduction of a total occlusion to 0% residual with excellent flow down to the popliteal artery.  The Ansell sheath was then withdrawn over the bifurcation over an 035 versa core wire exchanged for a short 7 [place] sheath which was then secured in place.  She received 300 mg of p.o. Plavix. Final Impression: Successful Hawk 1 directional atherectomy followed by drug-coated balloon angioplasty of a right SFA CTO in the setting of critical ischemia with 0 vessel runoff.  This is the third time I revascularized her right SFA CTO, first in 2016 and then in 2018.  She does continue to smoke.  She is on Eliquis for PAF.  She can be on Plavix and Eliquis, ask if aspirin can be discontinued.  She will be hydrated overnight and her renal function followed carefully given her renal insufficiency.  We will obtain lower extremity arterial Doppler studies in our [facility] line office next week and I will see her back 1 to 2 weeks thereafter.  She left the lab in stable condition. Quay Burow. MD, [facility] 06/07/2021 12:29 PM    VAS US CAROTID  Result Date: 06/03/2021 Carotid Arterial Duplex Study Patient Name:  ADELEINE PASK  Date of Exam:    06/02/2021 Medical Rec #: [id number]      Accession #:    [id number] Date of Birth: Jan 18, 1961     Patient Gender: F Patient Age:   73Y Exam Location:  [facility] Procedure:      VAS US CAROTID Referring Phys: [id number] JONATHAN J BERRY --------------------------------------------------------------------------------  Indications:       Left endarterectomy and h/o bilateral carotid artery stenosis                    with known RICA occlusion. Patient does have blurred vision                    due to diabetes and is currently under ophthalmology                    observation where she receives injections. She denies any                    other cerebrovascular symptoms. Risk Factors:      Hypertension, hyperlipidemia, Diabetes, current smoker,                    coronary artery disease, PAD. Other Factors:     COPD. Comparison Study:  In 05/2020, a carotid duplex velocities of 43/16 cm/s in                    LICA, s/p endarterectomy. Known RICA occlusion. Performing Technologist: Sharlett Iles RVT  Examination Guidelines: A complete evaluation includes B-mode imaging, spectral Doppler, color Doppler, and power Doppler as needed of all accessible portions of each vessel. Bilateral testing is considered an integral part of a complete examination. Limited examinations for reoccurring indications may be performed as noted.  Right Carotid Findings: +----------+-------+-------+--------+--------------------------------+---------+           PSV    EDV    StenosisPlaque Description              Comments            cm/s  cm/s                                                     +----------+-------+-------+--------+--------------------------------+---------+ CCA Prox  229    0      >50%    heterogenous                              +----------+-------+-------+--------+--------------------------------+---------+ CCA Distal32     7              heterogenous                               +----------+-------+-------+--------+--------------------------------+---------+ ICA Prox  0      0      Occludedheterogenous and diffuse                  +----------+-------+-------+--------+--------------------------------+---------+ ICA Mid   0      0      Occludedheterogenous and diffuse                  +----------+-------+-------+--------+--------------------------------+---------+ ICA Distal0      0      Occludedheterogenous and diffuse                  +----------+-------+-------+--------+--------------------------------+---------+ ECA       505    53     >50%    heterogenous, irregular and     shadowing                                 diffuse                                   +----------+-------+-------+--------+--------------------------------+---------+ +----------+--------+-------+---------+-------------------+           PSV cm/sEDV cmsDescribe Arm Pressure (mmHG) +----------+--------+-------+---------+-------------------+ [id number]            Turbulent127                 +----------+--------+-------+---------+-------------------+ +---------+--------+--+--------+--+---------+ VertebralPSV cm/s68EDV cm/s21Antegrade +---------+--------+--+--------+--+---------+  Left Carotid Findings: +----------+--------+--------+--------+------------------+-------------------+           PSV cm/sEDV cm/sStenosisPlaque DescriptionComments            +----------+--------+--------+--------+------------------+-------------------+ CCA Prox  62      8                                 tortuous            +----------+--------+--------+--------+------------------+-------------------+ CCA Distal87      11                                intimal hyperplasia +----------+--------+--------+--------+------------------+-------------------+ ICA Prox  52      9       1-39%                     intimal hyperplasia  +----------+--------+--------+--------+------------------+-------------------+ ICA Mid   40      14                                                    +----------+--------+--------+--------+------------------+-------------------+  ICA Distal46      18                                                    +----------+--------+--------+--------+------------------+-------------------+ ECA       101     9                                                     +----------+--------+--------+--------+------------------+-------------------+ +----------+--------+--------+----------------+-------------------+           PSV cm/sEDV cm/sDescribe        Arm Pressure (mmHG) +----------+--------+--------+----------------+-------------------+ [id number]             Multiphasic, [id number]                 +----------+--------+--------+----------------+-------------------+ +---------+--------+--+--------+--+---------+ VertebralPSV cm/s69EDV cm/s19Antegrade +---------+--------+--+--------+--+---------+   Summary: Right Carotid: Evidence consistent with a total occlusion of the right ICA.                Hemodynamically significant plaque >50% visualized in the CCA.                The ECA appears >50% stenosed. Known chronic RICA occlusion. Left Carotid: Velocities in the left ICA are consistent with a 1-39% stenosis.               Stable LICA velocities, s/p endarterectomy. Vertebrals:  Bilateral vertebral arteries demonstrate antegrade flow. Subclavians: Right subclavian artery flow was disturbed. Normal flow              hemodynamics were seen in the left subclavian artery. *See table(s) above for measurements and observations. Suggest follow up study in 12 months. Electronically signed by Larae Grooms MD on 06/03/2021 at 1:18:05 PM.    Final    Disposition   Pt is being discharged home today in good condition.  Follow-up Plans & Appointments     Follow-up Information     [facility] Follow up on 06/22/2021.   Specialty: Cardiology Why: at 1pm for your follow up dopplers. Contact information: [address] [place] [phone]        [name], MD Follow up on 06/25/2021.   Specialties: Cardiology, Radiology Why: at [facility] for your follow up appt. Contact information: [address] [place] [address] [phone]                Discharge Instructions     Call MD for:  redness, tenderness, or signs of infection (pain, swelling, redness, odor or green/yellow discharge around incision site)   Complete by: As directed    Diet - low sodium heart healthy   Complete by: As directed    Discharge instructions   Complete by: As directed    Groin Site Care Refer to this sheet in the next few weeks. These instructions provide you with information on caring for yourself after your procedure. Your caregiver may also give you more specific instructions. Your treatment has been planned according to current medical practices, but problems sometimes occur. Call your caregiver if you have any problems or questions after your procedure. HOME CARE INSTRUCTIONS You may shower 24 hours after the procedure. Remove the  bandage (dressing) and gently wash the site with plain soap and water. Gently pat the site dry.  Do not apply powder or lotion to the site.  Do not sit in a bathtub, swimming pool, or whirlpool for 5 to 7 days.  No bending, squatting, or lifting anything over 10 pounds (4.5 kg) as directed by your caregiver.  Inspect the site at least twice daily.  Do not drive home if you are discharged the same day of the procedure. Have someone else drive you.  You may drive 24 hours after the procedure unless otherwise instructed by your caregiver.  What to expect: Any bruising will usually fade within 1 to 2 weeks.  Blood that collects in the tissue (hematoma) may be painful to the touch. It should  usually decrease in size and tenderness within 1 to 2 weeks.  SEEK IMMEDIATE MEDICAL CARE IF: You have unusual pain at the groin site or down the affected leg.  You have redness, warmth, swelling, or pain at the groin site.  You have drainage (other than a small amount of blood on the dressing).  You have chills.  You have a fever or persistent symptoms for more than 72 hours.  You have a fever and your symptoms suddenly get worse.  Your leg becomes pale, cool, tingly, or numb.  You have heavy bleeding from the site. Hold pressure on the site. [name]  PLEASE DO NOT MISS ANY DOSES OF YOUR PLAVIX!!!!! Also keep a log of you blood pressures and bring back to your follow up appt. Please call the office with any questions.   Patients taking blood thinners should generally stay away from medicines like ibuprofen, Advil, Motrin, naproxen, and Aleve due to risk of stomach bleeding. You may take Tylenol as directed or talk to your primary doctor about alternatives.  PLEASE ENSURE THAT YOU DO NOT RUN OUT OF YOUR PLAVIX. This medication is very important to remain on for at least one year. IF you have issues obtaining this medication due to cost please CALL the office 3-5 business days prior to running out in order to prevent missing doses of this medication.   Increase activity slowly   Complete by: As directed        Discharge Medications   Allergies as of 06/08/2021       Reactions   Gabapentin Nausea And Vomiting, Other (See Comments)   POSSIBLE SHAKING   Lyrica [pregabalin] Other (See Comments)   Shaking        Medication List     STOP taking these medications    methocarbamol 500 MG tablet Commonly known as: ROBAXIN       TAKE these medications    Accu-Chek Guide test strip Generic drug: glucose blood USE TO CHECK BLOOD SUGAR FOUR TIMES DAILY   acetaminophen 500 MG tablet Commonly known as: TYLENOL Take 500 mg by mouth every 6 (six) hours as needed for moderate pain.    albuterol 108 (90 Base) MCG/ACT inhaler Commonly known as: VENTOLIN HFA Inhale 2 puffs into the lungs every 6 (six) hours as needed for wheezing or shortness of breath.   amiodarone 200 MG tablet Commonly known as: PACERONE Take 0.5 tablets (100 mg total) by mouth daily.   amLODipine 10 MG tablet Commonly known as: NORVASC Take 1 tablet (10 mg total) by mouth daily.   atorvastatin 40 MG tablet Commonly known as: LIPITOR Take 40 mg by mouth every evening.   bisoprolol 5 MG tablet Commonly known as:  ZEBETA Take 1 tablet (5 mg total) by mouth daily.   budesonide-formoterol 160-4.5 MCG/ACT inhaler Commonly known as: Symbicort Inhale 2 puffs into the lungs 2 (two) times daily.   clopidogrel 75 MG tablet Commonly known as: PLAVIX Take 1 tablet (75 mg total) by mouth daily with breakfast. Start taking on: June 09, 2021   colchicine 0.6 MG tablet Take 0.6 mg by mouth daily.   Eliquis 5 MG Tabs tablet Generic drug: apixaban Take 5 mg by mouth 2 (two) times daily.   empagliflozin 10 MG Tabs tablet Commonly known as: Jardiance Take 1 tablet (10 mg total) by mouth daily before breakfast.   FISH OIL PO Take 1 capsule by mouth daily.   FLUoxetine 10 MG capsule Commonly known as: PROZAC Take 1 capsule (10 mg total) by mouth at bedtime.   folic acid 1 MG tablet Commonly known as: FOLVITE Take 1 mg by mouth daily.   hydrALAZINE 25 MG tablet Commonly known as: APRESOLINE Take 1 tablet (25 mg total) by mouth 3 (three) times daily.   insulin lispro 100 UNIT/ML KwikPen Commonly known as: HUMALOG Inject 8 Units into the skin 3 (three) times daily with meals.   Lantus SoloStar 100 UNIT/ML Solostar Pen Generic drug: insulin glargine INJECT 20 UNITS INTO THE SKIN DAILY.   levothyroxine 50 MCG tablet Commonly known as: SYNTHROID Take 1 tablet (50 mcg total) by mouth daily.   nortriptyline 50 MG capsule Commonly known as: PAMELOR Take 50 mg by mouth 2 (two) times daily.    NovoLOG FlexPen 100 UNIT/ML FlexPen Generic drug: insulin aspart INJECT 8 UNITS INTO THE SKIN THREE TIMES DAILY WITH MEALS.   pantoprazole 40 MG tablet Commonly known as: PROTONIX Take 1 tablet (40 mg total) by mouth daily.   PenTips 32G X 4 MM Misc Generic drug: Insulin Pen Needle USE AS DIRECTED WITH INSULIN PENS   spironolactone 25 MG tablet Commonly known as: ALDACTONE Take 1 tablet (25 mg total) by mouth daily.   torsemide 20 MG tablet Commonly known as: DEMADEX Take 4 tablets (80 mg total) by mouth every morning AND 3 tablets (60 mg total) every evening.   Trulicity 0.71 QR/9.7JO Sopn Generic drug: Dulaglutide Inject 0.75 mg into the skin every Friday.   VITAMIN D PO Take 1 tablet by mouth daily.         Outstanding Labs/Studies   Follow up dopplers  Duration of Discharge Encounter   Greater than 30 minutes including physician time.  Signed, Reino Bellis, NP 06/08/2021, 10:34 AM

## 2021-06-08 NOTE — Discharge Instructions (Signed)
Information about your medication: Plavix (anti-platelet agent)  Generic Name (Brand): clopidogrel (Plavix), once daily medication  PURPOSE: You are taking this medication along with aspirin to lower your chance of having a heart attack, stroke, or blood clots in your stent. These can be fatal. Plavix and aspirin help prevent platelets from sticking together and forming a clot that can block an artery or your stent.   Common SIDE EFFECTS you may experience include: bruising or bleeding more easily, shortness of breath  Do not stop taking PLAVIX without talking to the doctor who prescribes it for you. People who are treated with a stent and stop taking Plavix too soon, have a higher risk of getting a blood clot in the stent, having a heart attack, or dying. If you stop Plavix because of bleeding, or for other reasons, your risk of a heart attack or stroke may increase.   Avoid taking NSAID agents or anti-inflammatory medications such as ibuprofen, naproxen given increased bleed risk with plavix - can use acetaminophen (Tylenol) if needed for pain.  Avoid taking over the counter stomach medications omeprazole (Prilosec) or esomeprazole (Nexium) since these do interact and make plavix less effective - ask your pharmacist or doctor for alterative agents if needed for heartburn or GERD.   Tell all of your doctors and dentists that you are taking Plavix. They should talk to the doctor who prescribed Plavix for you before you have any surgery or invasive procedure.   Contact your health care provider if you experience: severe or uncontrollable bleeding, pink/red/brown urine, vomiting blood or vomit that looks like "coffee grounds", red or black stools (looks like tar), coughing up blood or blood clots ----------------------------------------------------------------------------------------------------------------------

## 2021-06-10 ENCOUNTER — Other Ambulatory Visit (HOSPITAL_COMMUNITY): Payer: Self-pay

## 2021-06-10 ENCOUNTER — Encounter: Payer: Self-pay | Admitting: *Deleted

## 2021-06-10 NOTE — Progress Notes (Signed)
Paramedicine Encounter    Patient ID: [name], female    DOB: Sep 26, 1961, 60 y.o.   MRN: [id number]   Patient Care Team: [facility] [place] as PCP - General Gwenlyn Found, [name], MD as PCP - Cardiology (Cardiology) [name], MD as PCP - Electrophysiology (Cardiology) [name], MD as Consulting Physician (Cardiology) [name], MD as Consulting Physician (Pulmonary Disease) System, Provider Not In  Patient Active Problem List   Diagnosis Date Noted   Critical ischemia of foot ([place]) 06/07/2021   Fall 05/05/2021   PAF (paroxysmal atrial fibrillation) ([place]) 05/05/2021   COPD (chronic obstructive pulmonary disease) ([place]) 05/05/2021   DM (diabetes mellitus), type 2 with complications ([place]) 99991111   PAD (peripheral artery disease) ([place]) 05/05/2021   Cellulitis 05/05/2021   Acute on chronic combined systolic and diastolic CHF (congestive heart failure) ([place]) 05/05/2021   OSA (obstructive sleep apnea) 05/05/2021   CKD (chronic kidney disease) stage 3, GFR 30-59 ml/min ([place]) 05/05/2021   AKI (acute kidney injury) ([place]) 05/04/2021   Pressure injury of skin 05/04/2021   Ulcer of foot, unspecified laterality, limited to breakdown of skin ([place]) 02/17/2021   Altered mental status 01/02/2021   AF (paroxysmal atrial fibrillation) ([place]) 01/02/2021   CAD (coronary artery disease) 01/02/2021   PVD (peripheral vascular disease) ([place]) 01/02/2021   Acute renal failure superimposed on stage 4 chronic kidney disease ([place]) 01/02/2021   Diabetes mellitus type 2, uncontrolled, with complications ([place]) [id number]   Diabetic nephropathy ([place]) 01/02/2021   Low back pain 06/01/2020   Hyperlipidemia 04/03/2020   Muscle weakness 03/20/2020   Closed fracture of neck of right radius 03/13/2020   Pain in elbow 03/12/2020   PAD (peripheral artery disease) ([place]) 12/26/2019   Acute renal failure ([name])    Acute respiratory failure with hypoxia ([place]) 12/02/2019   Acute  exacerbation of CHF (congestive heart failure) ([place]) 12/01/2019   Cellulitis in diabetic foot ([place]) 07/08/2019   Osteomyelitis ([place]) 06/21/2019   NICM (nonischemic cardiomyopathy) ([place]) 06/20/2019   Non-healing ulcer ([place]) 06/20/2019   Acute CHF (congestive heart failure) ([place]) 11/26/2018   CHF (congestive heart failure) ([place]) 11/26/2018   Acute respiratory failure ([place]) 10/21/2018   AKI (acute kidney injury) ([place]) 08/18/2018   Coagulation disorder ([place]) 08/09/2017   Depression 07/21/2017   At risk for adverse drug reaction 06/20/2017   Peripheral neuropathy 06/20/2017   Acute osteomyelitis of right foot ([place]) 06/13/2017   S/P transmetatarsal amputation of foot, right ([place]) 06/05/2017   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 ([place]) 05/19/2017   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[place])    SIRS (systemic inflammatory response syndrome) ([place]) 04/06/2017   CKD (chronic kidney disease), stage III ([place]) 11/24/2016   Suspected sleep apnea 11/24/2016   Ulcer of toe of right foot, with necrosis of bone ([place]) 10/27/2016   Ulcer of left lower leg ([place]) 05/19/2016   Severe obesity (BMI >= 40) ([place]) 02/24/2016   COPD GOLD 0  02/24/2016   Morbid obesity ([place]) 02/24/2016   Encounter for therapeutic drug monitoring 02/10/2016   Symptomatic bradycardia 01/12/2016   Essential hypertension 12/22/2015   Chronic combined systolic and diastolic CHF (congestive heart failure) ([place])    [place]    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 11/08/2015   Tobacco abuse 10/23/2015   Coronary artery disease    DOE (dyspnea on exertion) 04/29/2015   Diabetes mellitus type 2, uncontrolled ([place]) 02/08/2015   Influenza A 02/07/2015   Wrist fracture, left, with  routine healing, subsequent encounter 02/05/2015   Wrist fracture, left, closed, initial encounter 01/29/2015   PAF (paroxysmal atrial fibrillation) ([place]) 01/16/2015   Carotid arterial disease ([place]) 01/16/2015   Claudication  ([place]) 01/15/2015   Demand ischemia ([name]) 10/29/2014   Insomnia 02/03/2014   COPD with acute exacerbation ([place]) 02/01/2014   S/P peripheral artery angioplasty - TurboHawk atherectomy; R SFA 09/11/2013    Class: Acute   Leg pain, bilateral 08/19/2013   Hypothyroidism 07/31/2013   Cellulitis 06/13/2013   History of cocaine abuse ([place]) 06/13/2013   Long term current use of anticoagulant therapy 05/20/2013   Alcohol abuse    Narcotic abuse ([place])    Marijuana abuse    Alcoholic cirrhosis ([name])    DM (diabetes mellitus), type 2 with peripheral vascular complications ([name])     Current Outpatient Medications:    ACCU-CHEK GUIDE test strip, USE TO CHECK BLOOD SUGAR FOUR TIMES DAILY, Disp: , Rfl:    acetaminophen (TYLENOL) 500 MG tablet, Take 500 mg by mouth every 6 (six) hours as needed for moderate pain., Disp: , Rfl:    albuterol (VENTOLIN HFA) 108 (90 Base) MCG/ACT inhaler, Inhale 2 puffs into the lungs every 6 (six) hours as needed for wheezing or shortness of breath., Disp: 18 g, Rfl: 0   amiodarone (PACERONE) 200 MG tablet, Take 0.5 tablets (100 mg total) by mouth daily., Disp: 45 tablet, Rfl: 3   amLODipine (NORVASC) 10 MG tablet, Take 1 tablet (10 mg total) by mouth daily., Disp: 90 tablet, Rfl: 3   apixaban (ELIQUIS) 5 MG TABS tablet, Take 5 mg by mouth 2 (two) times daily., Disp: , Rfl:    atorvastatin (LIPITOR) 40 MG tablet, Take 40 mg by mouth every evening., Disp: , Rfl:    bisoprolol (ZEBETA) 5 MG tablet, Take 1 tablet (5 mg total) by mouth daily., Disp: 90 tablet, Rfl: 3   budesonide-formoterol (SYMBICORT) 160-4.5 MCG/ACT inhaler, Inhale 2 puffs into the lungs 2 (two) times daily., Disp: 1 Inhaler, Rfl: 2   clopidogrel (PLAVIX) 75 MG tablet, Take 1 tablet (75 mg total) by mouth daily with breakfast., Disp: 90 tablet, Rfl: 1   colchicine 0.6 MG tablet, Take 0.6 mg by mouth daily., Disp: , Rfl:    Dulaglutide (TRULICITY) [id number] [id number] SOPN, Inject 0.75 mg into the skin every Friday.,  Disp: , Rfl:    empagliflozin (JARDIANCE) 10 MG TABS tablet, Take 1 tablet (10 mg total) by mouth daily before breakfast., Disp: 90 tablet, Rfl: 3   FLUoxetine (PROZAC) 10 MG capsule, Take 1 capsule (10 mg total) by mouth at bedtime., Disp: 30 capsule, Rfl: 0   folic acid (FOLVITE) 1 MG tablet, Take 1 mg by mouth daily., Disp: , Rfl:    hydrALAZINE (APRESOLINE) 25 MG tablet, Take 1 tablet (25 mg total) by mouth 3 (three) times daily., Disp: 90 tablet, Rfl: 11   insulin aspart (NOVOLOG) 100 UNIT/ML FlexPen, INJECT 8 UNITS INTO THE SKIN THREE TIMES DAILY WITH MEALS., Disp: 15 mL, Rfl: 0   insulin glargine (LANTUS) 100 UNIT/ML Solostar Pen, INJECT 20 UNITS INTO THE SKIN DAILY., Disp: 15 mL, Rfl: 0   insulin lispro (HUMALOG) 100 UNIT/ML KwikPen, Inject 8 Units into the skin 3 (three) times daily with meals., Disp: 15 mL, Rfl: 0   Insulin Pen Needle 32G X 4 MM MISC, USE AS DIRECTED WITH INSULIN PENS, Disp: 100 each, Rfl: 0   levothyroxine (SYNTHROID, LEVOTHROID) 50 MCG tablet, Take 1 tablet (50 mcg total) by mouth daily., Disp:  30 tablet, Rfl: 0   nortriptyline (PAMELOR) 50 MG capsule, Take 50 mg by mouth 2 (two) times daily., Disp: , Rfl:    Omega-3 Fatty Acids (FISH OIL PO), Take 1 capsule by mouth daily., Disp: , Rfl:    pantoprazole (PROTONIX) 40 MG tablet, Take 1 tablet (40 mg total) by mouth daily., Disp:  , Rfl:    spironolactone (ALDACTONE) 25 MG tablet, Take 1 tablet (25 mg total) by mouth daily., Disp: 30 tablet, Rfl: 11   torsemide (DEMADEX) 20 MG tablet, Take 4 tablets (80 mg total) by mouth every morning AND 3 tablets (60 mg total) every evening., Disp: 210 tablet, Rfl: 11   VITAMIN D PO, Take 1 tablet by mouth daily., Disp: , Rfl:  Allergies  Allergen Reactions   Gabapentin Nausea And Vomiting and Other (See Comments)    POSSIBLE SHAKING   Lyrica [Pregabalin] Other (See Comments)    Shaking       Social History   Socioeconomic History   Marital status: Single    Spouse name:  Not on file   Number of children: 1   Years of education: 12   Highest education level: 12th grade  Occupational History   Occupation: disabled  Tobacco Use   Smoking status: Every Day    Packs/day: 1.00    Years: 44.00    Pack years: 44.00    Types: E-cigarettes, Cigarettes   Smokeless tobacco: Former    Types: Snuff  Vaping Use   Vaping Use: Former   Devices: 11/26/2018 "stopped months ago"  Substance and Sexual Activity   Alcohol use: Yes    Comment: occ   Drug use: Yes    Types: "Crack" cocaine, Marijuana, Oxycodone   Sexual activity: Not Currently  Other Topics Concern   Not on file  Social History Narrative   ** Merged History Encounter **       Lives in [place], in motel with sister.  They are looking to move but don't have a place to go yet.     Social Determinants of Health   Financial Resource Strain: Low Risk    Difficulty of Paying Living Expenses: Not very hard  Food Insecurity: Not on file  Transportation Needs: Not on file  Physical Activity: Not on file  Stress: Not on file  Social Connections: Not on file  Intimate Partner Violence: Not on file    Physical Exam      Future Appointments  Date Time Provider [place]  06/11/2021 10:30 AM Valinda Party, DO [facility]  06/22/2021  1:00 PM [facility] VASC 1 [facility]  06/25/2021  9:00 AM [name], MD [facility]  07/13/2021  2:30 PM [facility] PA/NP [facility] None    BP 138/76   Pulse 78   Resp 18   Wt 222 lb (100.7 kg)   LMP  (LMP Unknown)   BMI 38.11 kg/m   Weight yesterday-? Last visit weight-226  Pt reports she is doing ok since her procedure, she reports her legs feel better.  She denies increased sob, no dizziness, no c/p, no bleeding issues.  She does not have the colchicine-her pcp needs to send in new rx for it.  Also she does not have the jardiance bottle-I seen a note where it was d/c due to hyperk but I dont see any particular note in her chart  from the hosp d/c or anything-will f/u on that.   Meds verified and pill box refilled   -  refills-hydralazine   Marylouise Stacks, [facility] Paramedic  06/10/21

## 2021-06-11 ENCOUNTER — Encounter (HOSPITAL_BASED_OUTPATIENT_CLINIC_OR_DEPARTMENT_OTHER): Payer: Medicare (Managed Care) | Admitting: Internal Medicine

## 2021-06-11 ENCOUNTER — Encounter (HOSPITAL_COMMUNITY): Payer: Self-pay | Admitting: Cardiovascular Disease

## 2021-06-11 ENCOUNTER — Telehealth (HOSPITAL_COMMUNITY): Payer: Self-pay

## 2021-06-11 ENCOUNTER — Other Ambulatory Visit: Payer: Self-pay

## 2021-06-11 ENCOUNTER — Other Ambulatory Visit (HOSPITAL_BASED_OUTPATIENT_CLINIC_OR_DEPARTMENT_OTHER): Payer: Self-pay

## 2021-06-11 ENCOUNTER — Other Ambulatory Visit (HOSPITAL_COMMUNITY): Payer: Self-pay

## 2021-06-11 DIAGNOSIS — E11621 Type 2 diabetes mellitus with foot ulcer: Secondary | ICD-10-CM | POA: Diagnosis not present

## 2021-06-11 DIAGNOSIS — S91301D Unspecified open wound, right foot, subsequent encounter: Secondary | ICD-10-CM | POA: Diagnosis not present

## 2021-06-11 NOTE — Telephone Encounter (Signed)
Transitions of Care Pharmacy  ° °Call attempted for a pharmacy transitions of care follow-up. HIPAA appropriate voicemail was left with call back information provided.  ° °Call attempt #1. Will follow-up in 2-3 days.  °  °

## 2021-06-11 NOTE — Progress Notes (Signed)
**Note Karla-Identified via Obfuscation** [name], [name] ([id number]) Visit Report for 06/11/2021 Chief Complaint Document Details Patient Name: Date of Service: [name] 06/11/2021 10:30 A M Medical Record Number: [id number] Patient Account Number: [id number] Date of Birth/Sex: Treating RN: 1961-05-16 (60 y.o. [name] Primary Care Provider: [facility] [place] Other Clinician: Referring Provider: Treating Provider/Extender: [name]: 16 Information Obtained from: Patient Chief Complaint Right plantar foot wound Electronic Signature(s) Signed: 06/11/2021 12:15:51 PM By: Karla Shan Price Entered By: [name] on 06/11/2021 12:08:13 -------------------------------------------------------------------------------- Debridement Details Patient Name: Date of Service: Karla Corwin, Price [name] 06/11/2021 10:30 A M Medical Record Number: [id number] Patient Account Number: [id number] Date of Birth/Sex: Treating RN: 03/29/1961 (60 y.o. [name] Primary Care Provider: [facility] [place] Other Clinician: Referring Provider: Treating Provider/Extender: [name] Price, [name] in Price: 16 Debridement Performed for Assessment: Wound #16 Right,Plantar Amputation Site - Transmetatarsal Performed By: Physician [name] Debridement Type: Debridement Severity of Tissue Pre Debridement: Fat layer exposed Level of Consciousness (Pre-procedure): Awake and Alert Pre-procedure Verification/Time Out Yes - 12:00 Taken: Start Time: 12:03 T Area Debrided (L x W): otal 1 (cm) x 1.5 (cm) = 1.5 (cm) Tissue and other material debrided: Viable, Non-Viable, Callus, Slough, Subcutaneous, Skin: Epidermis, Slough Level: Skin/Subcutaneous Tissue Debridement Description: Excisional Instrument: Curette Bleeding: Minimum Hemostasis Achieved: Pressure End Time: 12:05 Procedural Pain: 0 Post Procedural Pain: 0 Response to Price: Procedure was tolerated  well Level of Consciousness (Post- Awake and Alert procedure): Post Debridement Measurements of Total Wound Length: (cm) 1 Width: (cm) 1.5 Depth: (cm) 0.4 Volume: (cm) 0.471 Character of Wound/Ulcer Post Debridement: Improved Severity of Tissue Post Debridement: Fat layer exposed Post Procedure Diagnosis Same as Pre-procedure Electronic Signature(s) Signed: 06/11/2021 12:15:51 PM By: Karla Shan Price Signed: 06/11/2021 2:27:43 PM By: [name] Entered By: Karla Gouty on 06/11/2021 12:06:48 -------------------------------------------------------------------------------- HPI Details Patient Name: Date of Service: Karla Corwin, Price [name] 06/11/2021 10:30 A M Medical Record Number: [id number] Patient Account Number: [id number] Date of Birth/Sex: Treating RN: December 17, 1960 (60 y.o. [name] Primary Care Provider: [place] Other Clinician: Referring Provider: Treating Provider/Extender: [name] Price, [name] in Price: 16 History of Present Illness HPI Description: This 60 year old patient who has a very long significant history of diabetes mellitus, previous alcohol and nicotine abuse, chronic data disease, COPD, diabetes mellitus, height hypertension, critical lower limb ischemia with several wounds being managed at the wound Price at [name] for over a year. She was recently in the ER at [name] and was referred to our Price. He has had a long history of critical limb ischemia and over a period of time has had balloon angioplasties in March 2016, endarterectomy of the left carotid, lower extremity angiogram and Price by [name], several cardiac catheterizations. Most recently she had an x-ray of her left foot while in the ER which showed no acute abnormality. during this ER visit she was started on ciprofloxacin and asked to continue with the wound care physicians at [name]. Her last ABI done in June  2017 showed the right side to be 0.28 in the left side to be 0.48. Her right toe brachial index was 0.17 on the right and 0.27 on the left. her last hemoglobin A1c was 11.1% She continues to smoke about a pack of cigarettes a day. 03/28/2017 -- -- right foot x-ray -- IMPRESSION:Areas of soft tissue swelling. Mild subluxation second PIP joint. No frank dislocation or  fracture. No erosive change or bony destruction. No soft tissue ulceration or radiopaque foreign body evident by radiography. There is plantar fascia calcification with a nearby inferior calcaneal spur. There are foci of arterial vascular calcification. 04/04/2017 -- the patient continues to be noncompliant and continues to complain of a lot of pain and has not done anything about quitting smoking Readmission: 06/26/18 on evaluation today patient presents for reevaluation she has not been seen in our office since May 2018. Since she was last seen here in our office she has undergone testing at [name] office where it was revealed that she had an ABI of 0.68 with her previous ABI being 0.64 and a left ABI of 0.38 with previous ABI being 0.34 this study which was performed on 04/05/18 was pretty much equivalent to the study performed on 11/22/17. The findings in the end revealed on the right would appear to be moderate right lower extremity arterial disease on the left [name] states moderate in the report but unfortunately this appears to be much more severe at 0.38 compared to the right. Subsequently the patient was scheduled to have angiography with [name] on 04/12/18. This was however canceled due to the fact that the patient was Price to have chronic kidney disease stage III and it was to the point that he did not feel that it will be safe to pursue angiography at that point. She has not been on any antibiotics recently. At this point [name] has not rescheduled anything as far as the injured [name] according to the  patient he is somewhat reluctant to Price so. Nonetheless with her diminished blood flow this is gonna make it somewhat difficult for her to heal. 07/05/18; this is a patient I have not seen previously. She has very significant PAD as noted above. She apparently has had revascularization efforts by [name] on the right on 3 occasions to the patient. She was supposed to have an attempted angiography on the left however this was canceled apparently because of stage III chronic renal failure. I will need to research all of this. She complains of significant pain in the wound and has claudication enough that when she walks to the end of the driveway she has to stop. She is been using silver alginate to the wounds on her legs and Iodoflex to the area on her left second toe. 07/13/18 on evaluation today patient's wounds actually appear to be doing about the same. She has an appointment she tells me within the next month that is September 2019 with [name] to discuss options to see if there's anything else that you can recommend or Price for her. Nonetheless obviously what we're trying to Price is Price what we can to save her leg and in turn prevent any additional worsening or damage. None in the meantime we been mainly trying to manage her ulcers as best we can. 07/27/18; some improvement in the multiplicity of wounds on her left lower extremity and foot. She's been using silver alginate. I was unable to determine that she actually has an appointment with [name]. We are checking into this The patient's wound includes Left lateral foot, left plantar heel, her left anterior calf wound looks close to me, left fourth toe is very close to closed and the left medial calf is perhaps the largest open area READMISSION 02/19/2021 This is a now 60 year old woman with type 2 diabetes and continued cigarette smoking. She has known PAD. She was last  in this clinic in 2019 at that point with wounds on her left foot. She left  in a nonhealed state. On 06/28/2019 I see she had a left femoropopliteal by vein and vascular with an amputation of the fifth ray. These managed to heal over. Her last arterial studies were in February 2021. This showed an absent waveform at the posterior tibial artery on the right a dampened monophasic dorsalis pedis on the right of 0.44. On the left again the PTA TA was absent her dorsalis pedis was 0.99 triphasic and her great toe pressure was 0.65. This would have been after her revascularization. Once again she tells me that [name] has done revascularization on the right leg on 3 different occasions. She has a right transmetatarsal amputation apparently done by Dr. Sharol Given remotely but I Price not see a note for these right lower extremity revascularization but I may not of looked back far enough. In any case she says that the she has a wound on the plantar aspect of the right transmetatarsal amputation site there is been there for several months. More recently she fell and had a wound on her right medial lower leg she had 5 sutures placed in the ER and then subsequently she has developed an area on the medial lower leg which was a blister that opened up. Past medical history includes type 2 diabetes, PAD, chronic renal failure, congestive heart failure, right transmetatarsal amputation, left fifth ray amputation, continued tobacco abuse, atrial fibrillation and cirrhosis. We did not attempt an ABI on the right leg today because of pain 02/26/2021; patient we admitted to the clinic last week. She has what looks to be 2 areas on her right medial and right anterior lower leg that look more like venous wounds but she has 1 on the first met head at the base of her right transmet. She has known severe PAD. She complains of a lot of pain although some of this may be neuropathic. Is difficult to exclude a component of claudication. Use silver alginate on the wounds on her legs and Iodoflex on the area on the  first met head. She has an appointment with [name] on 5/25 although I will text him and discussed the situation. She is have poorly controlled diabetic. She has has stage IIIb chronic renal failure 4/22; patient presents for 1 week follow-up. The 2 areas on her right medial and right anterior lower leg appear well-healing. She has been using silver alginate to this area without issues. She has a first met head ulcer and it is unclear how long this has been there as it was discovered in the ED earlier this month when she was being evaluated for another issue. Iodoflex has been used at this area. 4/29; patient presents for 1 week follow-up. She has been using silver alginate to the leg wounds and Iodoflex to the plantar foot wound. She has had this wrapped with Coban and Kerlix. She has no concerns or complaints today. 5/6; this is a difficult wound on the right plantar foot transmit site. We are not making a lot of progress. She had 2 more venous looking wounds on the right medial and right anterior lower leg 1 of which is healed. Her appointment with [name] is on 5/25 5/16; she did not tolerate the offloading shoe we gave her in fact she had a fall with an abrasion on her right forearm she is now back in the modified small shoe which does not offload her  foot properly. Her appoint with [name] is still on 5/25 we have been using Iodoflex. The area on the right anterior lower leg is healed 7/18; patient presents for follow-up however has not been seen in 2 months. She was last seen at the end of May. She had a fall at the end of June and was hospitalized. She has been unable to follow-up since then. For the wound she has only been keeping it covered with gauze. She is scheduled for an aortogram with lower extremity runoff at the end of July. 7/29; patient presents for 2-week follow-up. She has home health and they have been changing her dressings. She denies any issues and has no  complaints today. She states she had a procedure where they opened up one of her vessels in her legs. She denies signs of infection. Electronic Signature(s) Signed: 06/11/2021 12:15:51 PM By: Karla Shan Price Entered By: [name] on 06/11/2021 12:09:01 -------------------------------------------------------------------------------- Physical Exam Details Patient Name: Date of Service: Karla Corwin, Price [name] 06/11/2021 10:30 A M Medical Record Number: [id number] Patient Account Number: [id number] Date of Birth/Sex: Treating RN: 10/20/1961 (60 y.o. [name] Primary Care Provider: [place] Other Clinician: Referring Provider: Treating Provider/Extender: [name] Price, [name] in Price: 16 Constitutional respirations regular, non-labored and within target range for patient.[name] Psychiatric pleasant and cooperative. Notes Right foot: Trans-met with open wound with nonviable tissue and granulation tissue present. Significant callus a circumferentially. No signs of infection. Electronic Signature(s) Signed: 06/11/2021 12:15:51 PM By: Karla Shan Price Entered By: [name] on 06/11/2021 12:10:34 -------------------------------------------------------------------------------- Physician Orders Details Patient Name: Date of Service: Karla Corwin, Price [name] 06/11/2021 10:30 A M Medical Record Number: [id number] Patient Account Number: [id number] Date of Birth/Sex: Treating RN: July 25, 1961 (60 y.o. [name] Primary Care Provider: Other Clinician: [facility], [facility] Starcher Referring Provider: Treating Provider/Extender: [name] Price, [name] in Price: 16 Verbal / Phone Orders: No Diagnosis Coding ICD-10 Coding Code Description 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 2 diabetes mellitus with foot ulcer S91.301D Unspecified open wound, right foot, subsequent encounter E11.51 Type 2 diabetes mellitus with diabetic peripheral angiopathy without  gangrene Follow-up Appointments ppointment in 1 week. - with Dr. Heber [place] Friday Return A Return appointment in 3 weeks. - with Dr. Dellia Nims Bathing/ Shower/ Hygiene May shower with protection but Price not get wound dressing(s) wet. Edema Control - Lymphedema / SCD / Other Elevate legs to the level of the heart or above for 30 minutes daily and/or when sitting, a frequency of: - throughout the day Avoid standing for long periods of time. Exercise regularly Off-Loading Open toe surgical shoe to: - with felt padding to offload Additional Orders / Instructions Stop/Decrease Smoking Follow Nutritious Diet Home Health No change in wound care orders this week; continue Home Health for wound care. May utilize formulary equivalent dressing for wound Price orders unless otherwise specified. Other Home Health Orders/Instructions: - [place] [facility] to work with patient on standing and walking Wound Price Wound #16 - Amputation Site - Transmetatarsal Wound Laterality: Plantar, Right Cleanser: Soap and Water 2 x Per Week/15 Days Discharge Instructions: May shower and wash wound with dial antibacterial soap and water prior to dressing change. Cleanser: Wound Cleanser (Home Health) 2 x Per Week/15 Days Discharge Instructions: Cleanse the wound with wound cleanser or normal saline prior to applying a clean dressing using gauze sponges, not tissue or cotton balls. Peri-Wound Care: Sween Lotion (Moisturizing lotion) (Home Health) 2 x Per Week/15  Days Discharge Instructions: Apply moisturizing lotion as directed Prim Dressing: KerraCel Ag Gelling Fiber Dressing, 2x2 in (silver alginate) (Home Health) 2 x Per Week/15 Days ary Discharge Instructions: Apply silver alginate to wound bed as instructed Prim Dressing: Santyl Ointment 2 x Per Week/15 Days ary Discharge Instructions: Apply in clinic only, under alginate Secondary Dressing: Woven  Gauze Sponge, Non-Sterile 4x4 in 2 x Per Week/15 Days Discharge Instructions: Apply over primary dressing as directed. Secondary Dressing: Optifoam Non-Adhesive Dressing, 4x4 in 2 x Per Week/15 Days Discharge Instructions: Foam donut to help offload Compression Wrap: Kerlix Roll 4.5x3.1 (in/yd) 2 x Per Week/15 Days Discharge Instructions: Apply Kerlix and Coban compression as directed. Compression Wrap: Coban Self-Adherent Wrap 4x5 (in/yd) 2 x Per Week/15 Days Discharge Instructions: Apply over Kerlix as directed. Electronic Signature(s) Signed: 06/11/2021 12:15:51 PM By: Karla Shan Price Entered By: [name] on 06/11/2021 12:12:52 -------------------------------------------------------------------------------- Problem List Details Patient Name: Date of Service: Karla Corwin, Price [name] 06/11/2021 10:30 A M Medical Record Number: [id number] Patient Account Number: [id number] Date of Birth/Sex: Treating RN: 1961-10-30 (60 y.o. [name] Primary Care Provider: [facility] [place] Other Clinician: Referring Provider: Treating Provider/Extender: [name] Price, [name] in Price: 16 Active Problems ICD-10 Encounter Code Description Active Date MDM Diagnosis 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 2 diabetes mellitus with foot ulcer 02/19/2021 No Yes S91.301D Unspecified open wound, right foot, subsequent encounter 05/31/2021 No Yes E11.51 Type 2 diabetes mellitus with diabetic peripheral angiopathy without gangrene 02/19/2021 No Yes Inactive Problems ICD-10 Code Description Active Date Inactive Date L97.811 Non-pressure chronic ulcer of other part of right lower leg limited to breakdown of skin 02/19/2021 02/19/2021 Resolved Problems ICD-10 Code Description Active Date Resolved Date S40.811D Abrasion of right upper arm, subsequent encounter 03/29/2021 03/29/2021 Electronic Signature(s) Signed: 06/11/2021 12:15:51 PM By: Karla Shan Price Entered By: [name] on 06/11/2021  12:07:58 -------------------------------------------------------------------------------- Progress Note Details Patient Name: Date of Service: Karla Corwin, Price [name] 06/11/2021 10:30 A M Medical Record Number: [id number] Patient Account Number: [id number] Date of Birth/Sex: Treating RN: 1961-01-05 (60 y.o. [name] Primary Care Provider: [facility] [place] Other Clinician: Referring Provider: Treating Provider/Extender: [name] Price, [name] in Price: 16 Subjective Chief Complaint Information obtained from Patient Right plantar foot wound History of Present Illness (HPI) This 59 year old patient who has a very long significant history of diabetes mellitus, previous alcohol and nicotine abuse, chronic data disease, COPD, diabetes mellitus, height hypertension, critical lower limb ischemia with several wounds being managed at the wound Price at [facility] for over a year. She was recently in the ER at [facility] and was referred to our Price. He has had a long history of critical limb ischemia and over a period of time has had balloon angioplasties in March 2016, endarterectomy of the left carotid, lower extremity angiogram and Price by [name], several cardiac catheterizations. Most recently she had an x-ray of her left foot while in the ER which showed no acute abnormality. during this ER visit she was started on ciprofloxacin and asked to continue with the wound care physicians at [place] [name]. Her last ABI done in June 2017 showed the right side to be 0.28 in the left side to be 0.48. Her right toe brachial index was 0.17 on the right and 0.27 on the left. her last hemoglobin A1c was 11.1% She continues to smoke about a pack of cigarettes a day. 03/28/2017 -- -- right foot x-ray -- IMPRESSION:Areas of soft tissue swelling. Mild subluxation second  PIP joint. No frank dislocation or fracture. No erosive change or bony destruction. No soft  tissue ulceration or radiopaque foreign body evident by radiography. There is plantar fascia calcification with a nearby inferior calcaneal spur. There are foci of arterial vascular calcification. 04/04/2017 -- the patient continues to be noncompliant and continues to complain of a lot of pain and has not done anything about quitting smoking Readmission: 06/26/18 on evaluation today patient presents for reevaluation she has not been seen in our office since May 2018. Since she was last seen here in our office she has undergone testing at Dr. Fransico Michael office where it was revealed that she had an ABI of 0.68 with her previous ABI being 0.64 and a left ABI of 0.38 with previous ABI being 0.34 this study which was performed on 04/05/18 was pretty much equivalent to the study performed on 11/22/17. The findings in the end revealed on the right would appear to be moderate right lower extremity arterial disease on the left [name] states moderate in the report but unfortunately this appears to be much more severe at 0.38 compared to the right. Subsequently the patient was scheduled to have angiography with [name] on 04/12/18. This was however canceled due to the fact that the patient was Price to have chronic kidney disease stage III and it was to the point that he did not feel that it will be safe to pursue angiography at that point. She has not been on any antibiotics recently. At this point [name] has not rescheduled anything as far as the injured [place] according to the patient he is somewhat reluctant to Price so. Nonetheless with her diminished blood flow this is gonna make it somewhat difficult for her to heal. 07/05/18; this is a patient I have not seen previously. She has very significant PAD as noted above. She apparently has had revascularization efforts by [name] on the right on 3 occasions to the patient. She was supposed to have an attempted angiography on the left however this was  canceled apparently because of stage III chronic renal failure. I will need to research all of this. She complains of significant pain in the wound and has claudication enough that when she walks to the end of the driveway she has to stop. She is been using silver alginate to the wounds on her legs and Iodoflex to the area on her left second toe. 07/13/18 on evaluation today patient's wounds actually appear to be doing about the same. She has an appointment she tells me within the next month that is September 2019 with [name] to discuss options to see if there's anything else that you can recommend or Price for her. Nonetheless obviously what we're trying to Price is Price what we can to save her leg and in turn prevent any additional worsening or damage. None in the meantime we been mainly trying to manage her ulcers as best we can. 07/27/18; some improvement in the multiplicity of wounds on her left lower extremity and foot. She's been using silver alginate. I was unable to determine that she actually has an appointment with [name]. We are checking into this The patient's wound includes Left lateral foot, left plantar heel, her left anterior calf wound looks close to me, left fourth toe is very close to closed and the left medial calf is perhaps the largest open area READMISSION 02/19/2021 This is a now 60 year old woman with type 2 diabetes and continued cigarette smoking.  She has known PAD. She was last in this clinic in 2019 at that point with wounds on her left foot. She left in a nonhealed state. On 06/28/2019 I see she had a left femoropopliteal by vein and vascular with an amputation of the fifth ray. These managed to heal over. Her last arterial studies were in February 2021. This showed an absent waveform at the posterior tibial artery on the right a dampened monophasic dorsalis pedis on the right of 0.44. On the left again the PTA TA was absent her dorsalis pedis was 0.99 triphasic and her  great toe pressure was 0.65. This would have been after her revascularization. Once again she tells me that [name] has done revascularization on the right leg on 3 different occasions. She has a right transmetatarsal amputation apparently done by Dr. Sharol Given remotely but I Price not see a note for these right lower extremity revascularization but I may not of looked back far enough. In any case she says that the she has a wound on the plantar aspect of the right transmetatarsal amputation site there is been there for several months. More recently she fell and had a wound on her right medial lower leg she had 5 sutures placed in the ER and then subsequently she has developed an area on the medial lower leg which was a blister that opened up. Past medical history includes type 2 diabetes, PAD, chronic renal failure, congestive heart failure, right transmetatarsal amputation, left fifth ray amputation, continued tobacco abuse, atrial fibrillation and cirrhosis. We did not attempt an ABI on the right leg today because of pain 02/26/2021; patient we admitted to the clinic last week. She has what looks to be 2 areas on her right medial and right anterior lower leg that look more like venous wounds but she has 1 on the first met head at the base of her right transmet. She has known severe PAD. She complains of a lot of pain although some of this may be neuropathic. Is difficult to exclude a component of claudication. Use silver alginate on the wounds on her legs and Iodoflex on the area on the first met head. She has an appointment with [name] on 5/25 although I will text him and discussed the situation. She is have poorly controlled diabetic. She has has stage IIIb chronic renal failure 4/22; patient presents for 1 week follow-up. The 2 areas on her right medial and right anterior lower leg appear well-healing. She has been using silver alginate to this area without issues. She has a first met head ulcer  and it is unclear how long this has been there as it was discovered in the ED earlier this month when she was being evaluated for another issue. Iodoflex has been used at this area. 4/29; patient presents for 1 week follow-up. She has been using silver alginate to the leg wounds and Iodoflex to the plantar foot wound. She has had this wrapped with Coban and Kerlix. She has no concerns or complaints today. 5/6; this is a difficult wound on the right plantar foot transmit site. We are not making a lot of progress. She had 2 more venous looking wounds on the right medial and right anterior lower leg 1 of which is healed. Her appointment with [name] is on 5/25 5/16; she did not tolerate the offloading shoe we gave her in fact she had a fall with an abrasion on her right forearm she is now back in the modified  small shoe which does not offload her foot properly. Her appoint with [name] is still on 5/25 we have been using Iodoflex. The area on the right anterior lower leg is healed 7/18; patient presents for follow-up however has not been seen in 2 months. She was last seen at the end of May. She had a fall at the end of June and was hospitalized. She has been unable to follow-up since then. For the wound she has only been keeping it covered with gauze. She is scheduled for an aortogram with lower extremity runoff at the end of July. 7/29; patient presents for 2-week follow-up. She has home health and they have been changing her dressings. She denies any issues and has no complaints today. She states she had a procedure where they opened up one of her vessels in her legs. She denies signs of infection. Patient History Information obtained from Patient. Family History Cancer - Mother, Diabetes - Mother, Heart Disease - Mother,Father, Hypertension - Mother,Father, Lung Disease - Siblings, Stroke - Mother, Thyroid Problems - Mother, No family history of Hereditary Spherocytosis, Kidney Disease,  Seizures, Tuberculosis. Social History Former smoker, Marital Status - Divorced, Alcohol Use - Never, Drug Use - Current History - marijuana, Caffeine Use - Daily - Coffee. Medical History Eyes Denies history of Cataracts, Glaucoma, Optic Neuritis Ear/Nose/Mouth/Throat Denies history of Chronic sinus problems/congestion, Middle ear problems Hematologic/Lymphatic Denies history of Anemia, Hemophilia, Human Immunodeficiency Virus, Lymphedema, Sickle Cell Disease Respiratory Patient has history of Chronic Obstructive Pulmonary Disease (COPD), Sleep Apnea Denies history of Aspiration, Asthma, Pneumothorax, Tuberculosis Cardiovascular Patient has history of Arrhythmia - NSVT , PAF , PAT Congestive Heart Failure - chronic combined, Coronary Artery Disease, Hypertension, Peripheral , Arterial Disease Denies history of Angina, Deep Vein Thrombosis, Hypotension, Myocardial Infarction, Peripheral Venous Disease, Phlebitis, Vasculitis Gastrointestinal Patient has history of Cirrhosis - alcoholic Denies history of Colitis, Crohnoos, Hepatitis A, Hepatitis B, Hepatitis C Endocrine Patient has history of Type II Diabetes Denies history of Type I Diabetes Genitourinary Denies history of End Stage Renal Disease Immunological Denies history of Lupus Erythematosus, Raynaudoos, Scleroderma Integumentary (Skin) Denies history of History of Burn Musculoskeletal Patient has history of Osteoarthritis Denies history of Gout, Rheumatoid Arthritis, Osteomyelitis Neurologic Patient has history of Neuropathy - dm2 peripheral Denies history of Dementia, Quadriplegia, Paraplegia, Seizure Disorder Oncologic Denies history of Received Chemotherapy, Received Radiation Psychiatric Denies history of Anorexia/bulimia, Confinement Anxiety Hospitalization/Karla History - balloon angioplasty. - cardiac cath X2. - cardioversionX2. - carotid angiogram. - DandC uterus. - endarcdectomy (L). - (L) heart cath. -  lower ext angiogram X2. - peripheral athrectomy. - 05/03/2021-05/05/2021 inpatient cone-fall CKD. Medical A Surgical History Notes nd Constitutional Symptoms (General Health) alcohol abuse , cocaine abuse , marijuana abuse , narcotic abuse , noncompliance , obesity , tobacco abuse , insomnia , Hematologic/Lymphatic B12 deficiency Respiratory wheezing , Cardiovascular cardiomyopathy , critical lower limb ischemia , elevated troponin , hyperlipidemia , hypokalemia , hypomagnesiumia , symptomatic bradycardia , carotid arterial disease ,demand ischemia , Gastrointestinal GERD Endocrine hypothyroidism Genitourinary renal insufficiency , CKD Stage 3 , Integumentary (Skin) cellulitis Psychiatric anxiety Objective Constitutional respirations regular, non-labored and within target range for patient.. Vitals Time Taken: 11:11 AM, Temperature: 98.3 F, Pulse: 62 bpm, Respiratory Rate: 20 breaths/min, Blood Pressure: 110/66 mmHg. Psychiatric pleasant and cooperative. General Notes: Right foot: Trans-met with open wound with nonviable tissue and granulation tissue present. Significant callus a circumferentially. No signs of infection. Integumentary (Hair, Skin) Wound #16 status is Open. Original cause of wound  was Gradually Appeared. The date acquired was: 02/05/2021. The wound has been in Price 16 weeks. The wound is located on the Right,Plantar Amputation Site - Transmetatarsal. The wound measures 1cm length x 1.5cm width x 0.4cm depth; 1.178cm^2 area and 0.471cm^3 volume. There is Fat Layer (Subcutaneous Tissue) exposed. There is no tunneling or undermining noted. There is a medium amount of serosanguineous drainage noted. The wound margin is thickened. There is large (67-100%) pink, pale granulation within the wound bed. General Notes: periwound callous. Assessment Active Problems ICD-10 Type 2 diabetes mellitus with foot ulcer Unspecified open wound, right foot, subsequent  encounter Type 2 diabetes mellitus with diabetic peripheral angiopathy without gangrene Overall patient's wound is stable. There is nonviable tissue and significant callus circumferentially and this was debrided. No obvious signs of infection. I recommended continuing silver alginate under Kerlix/Coban. I recommended offloading this area to help with healing. Patient had an abdominal aortogram on 7/25 with angioplasty to the right SFA. Follow-up in 1 week Procedures Wound #16 Pre-procedure diagnosis of Wound #16 is a Diabetic Wound/Ulcer of the Lower Extremity located on the Right,Plantar Amputation Site - Transmetatarsal .Severity of Tissue Pre Debridement is: Fat layer exposed. There was a Excisional Skin/Subcutaneous Tissue Debridement with a total area of 1.5 sq cm performed by [name]. With the following instrument(s): Curette to remove Viable and Non-Viable tissue/material. Material removed includes Callus, Subcutaneous Tissue, Slough, and Skin: Epidermis. No specimens were taken. A time out was conducted at 12:00, prior to the start of the procedure. A Minimum amount of bleeding was controlled with Pressure. The procedure was tolerated well with a pain level of 0 throughout and a pain level of 0 following the procedure. Post Debridement Measurements: 1cm length x 1.5cm width x 0.4cm depth; 0.471cm^3 volume. Character of Wound/Ulcer Post Debridement is improved. Severity of Tissue Post Debridement is: Fat layer exposed. Post procedure Diagnosis Wound #16: Same as Pre-Procedure Plan Follow-up Appointments: Return Appointment in 1 week. - with Dr. Heber Fairbury Friday Return appointment in 3 weeks. - with Dr. Arcola Jansky Shower/ Hygiene: May shower with protection but Price not get wound dressing(s) wet. Edema Control - Lymphedema / SCD / Other: Elevate legs to the level of the heart or above for 30 minutes daily and/or when sitting, a frequency of: - throughout the day Avoid  standing for long periods of time. Exercise regularly Off-Loading: Open toe surgical shoe to: - with felt padding to offload Additional Orders / Instructions: Stop/Decrease Smoking Follow Nutritious Diet Home Health: No change in wound care orders this week; continue Home Health for wound care. May utilize formulary equivalent dressing for wound Price orders unless otherwise specified. Other Home Health Orders/Instructions: - [facility] to work with patient on standing and walking WOUND #16: - Amputation Site - Transmetatarsal Wound Laterality: Plantar, Right Cleanser: Soap and Water 2 x Per Week/15 Days Discharge Instructions: May shower and wash wound with dial antibacterial soap and water prior to dressing change. Cleanser: Wound Cleanser (Home Health) 2 x Per Week/15 Days Discharge Instructions: Cleanse the wound with wound cleanser or normal saline prior to applying a clean dressing using gauze sponges, not tissue or cotton balls. Peri-Wound Care: Sween Lotion (Moisturizing lotion) (Home Health) 2 x Per Week/15 Days Discharge Instructions: Apply moisturizing lotion as directed Prim Dressing: KerraCel Ag Gelling Fiber Dressing, 2x2 in (silver alginate) (Home Health) 2 x Per Week/15 Days ary Discharge Instructions: Apply silver alginate to wound bed as instructed  Prim Dressing: Santyl Ointment 2 x Per Week/15 Days ary Discharge Instructions: Apply in clinic only, under alginate Secondary Dressing: Woven Gauze Sponge, Non-Sterile 4x4 in 2 x Per Week/15 Days Discharge Instructions: Apply over primary dressing as directed. Secondary Dressing: Optifoam Non-Adhesive Dressing, 4x4 in 2 x Per Week/15 Days Discharge Instructions: Foam donut to help offload Compression Wrap: Kerlix Roll 4.5x3.1 (in/yd) 2 x Per Week/15 Days Discharge Instructions: Apply Kerlix and Coban compression as directed. Compression Wrap: Coban  Self-Adherent Wrap 4x5 (in/yd) 2 x Per Week/15 Days Discharge Instructions: Apply over Kerlix as directed. 1. In office sharp debridement 2. Silver alginate under Kerlix/Coban 3. Follow-up in 1 week Electronic Signature(s) Signed: 06/11/2021 12:15:51 PM By: Karla Shan Price Entered By: [name] on 06/11/2021 12:15:26 -------------------------------------------------------------------------------- HxROS Details Patient Name: Date of Service: Karla Corwin, Price [name] 06/11/2021 10:30 A M Medical Record Number: [id number] Patient Account Number: [id number] Date of Birth/Sex: Treating RN: 1961/07/30 (60 y.o. [name] Primary Care Provider: [facility] Other Clinician: Referring Provider: Treating Provider/Extender: [name] Price, [name] in Price: 16 Information Obtained From Patient Constitutional Symptoms (General Health) Medical History: Past Medical History Notes: alcohol abuse , cocaine abuse , marijuana abuse , narcotic abuse , noncompliance , obesity , tobacco abuse , insomnia , Eyes Medical History: Negative for: Cataracts; Glaucoma; Optic Neuritis Ear/Nose/Mouth/Throat Medical History: Negative for: Chronic sinus problems/congestion; Middle ear problems Hematologic/Lymphatic Medical History: Negative for: Anemia; Hemophilia; Human Immunodeficiency Virus; Lymphedema; Sickle Cell Disease Past Medical History Notes: B12 deficiency Respiratory Medical History: Positive for: Chronic Obstructive Pulmonary Disease (COPD); Sleep Apnea Negative for: Aspiration; Asthma; Pneumothorax; Tuberculosis Past Medical History Notes: wheezing , Cardiovascular Medical History: Positive for: Arrhythmia - NSVT , PAF , PAT Congestive Heart Failure - chronic combined; Coronary Artery Disease; Hypertension; Peripheral Arterial ; Disease Negative for: Angina; Deep Vein Thrombosis; Hypotension; Myocardial Infarction; Peripheral Venous Disease; Phlebitis;  Vasculitis Past Medical History Notes: cardiomyopathy , critical lower limb ischemia , elevated troponin , hyperlipidemia , hypokalemia , hypomagnesiumia , symptomatic bradycardia , carotid arterial disease ,demand ischemia , Gastrointestinal Medical History: Positive for: Cirrhosis - alcoholic Negative for: Colitis; Crohns; Hepatitis A; Hepatitis B; Hepatitis C Past Medical History Notes: GERD Endocrine Medical History: Positive for: Type II Diabetes Negative for: Type I Diabetes Past Medical History Notes: hypothyroidism Time with diabetes: 5 years Treated with: Insulin Blood sugar tested every day: Yes Tested : 3x a day Genitourinary Medical History: Negative for: End Stage Renal Disease Past Medical History Notes: renal insufficiency , CKD Stage 3 , Immunological Medical History: Negative for: Lupus Erythematosus; Raynauds; Scleroderma Integumentary (Skin) Medical History: Negative for: History of Burn Past Medical History Notes: cellulitis Musculoskeletal Medical History: Positive for: Osteoarthritis Negative for: Gout; Rheumatoid Arthritis; Osteomyelitis Neurologic Medical History: Positive for: Neuropathy - dm2 peripheral Negative for: Dementia; Quadriplegia; Paraplegia; Seizure Disorder Oncologic Medical History: Negative for: Received Chemotherapy; Received Radiation Psychiatric Medical History: Negative for: Anorexia/bulimia; Confinement Anxiety Past Medical History Notes: anxiety Immunizations Pneumococcal Vaccine: Received Pneumococcal Vaccination: Yes Received Pneumococcal Vaccination On or After 60th Birthday: No Tetanus Vaccine: Last tetanus shot: 10/14/1981 Implantable Devices None Hospitalization / Karla History Type of Hospitalization/Karla balloon angioplasty cardiac cath X2 cardioversionX2 carotid angiogram DandC uterus endarcdectomy (L) (L) heart cath lower ext angiogram X2 peripheral athrectomy 05/03/2021-05/05/2021 inpatient  cone-fall CKD Family and Social History Cancer: Yes - Mother; Diabetes: Yes - Mother; Heart Disease: Yes - Mother,Father; Hereditary Spherocytosis: No; Hypertension: Yes - Mother,Father; Kidney Disease: No; Lung Disease: Yes - Siblings; Seizures: No; Stroke:  Yes - Mother; Thyroid Problems: Yes - Mother; Tuberculosis: No; Former smoker; Marital Status - Divorced; Alcohol Use: Never; Drug Use: Current History - marijuana; Caffeine Use: Daily - Coffee; Financial Concerns: No; Food, Clothing or Shelter Needs: No; Support System Lacking: No; Transportation Concerns: No Electronic Signature(s) Signed: 06/11/2021 12:15:51 PM By: Karla Shan Price Signed: 06/11/2021 2:27:43 PM By: [name] Entered By: [name] on 06/11/2021 12:09:15 -------------------------------------------------------------------------------- SuperBill Details Patient Name: Date of Service: Karla Corwin, Price [name] 06/11/2021 Medical Record Number: [id number] Patient Account Number: [id number] Date of Birth/Sex: Treating RN: 1961/01/06 (60 y.o. [name] Primary Care Provider: [place] Other Clinician: Referring Provider: Treating Provider/Extender: [name] Price, [name] in Price: 16 Diagnosis Coding ICD-10 Codes Code Description 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 2 diabetes mellitus with foot ulcer S91.301D Unspecified open wound, right foot, subsequent encounter E11.51 Type 2 diabetes mellitus with diabetic peripheral angiopathy without gangrene Facility Procedures CPT4 Code: [id number] Description: 55974 - DEB SUBQ TISSUE 20 SQ CM/< ICD-10 Diagnosis Description S91.301D Unspecified open wound, right foot, subsequent encounter Modifier: Quantity: 1 Physician Procedures : CPT4 Code Description Modifier [id number] - WC PHYS SUBQ TISS 20 SQ CM ICD-10 Diagnosis Description S91.301D Unspecified open wound, right foot, subsequent encounter Quantity: 1 Electronic Signature(s) Signed:  06/11/2021 12:15:51 PM By: Karla Shan Price Entered By: [name] on 06/11/2021 12:15:33

## 2021-06-13 ENCOUNTER — Observation Stay (HOSPITAL_COMMUNITY)
Admission: EM | Admit: 2021-06-13 | Discharge: 2021-06-14 | Disposition: A | Discharge status: Home / Self Care | Payer: Medicare (Managed Care) | Attending: Family Medicine | Admitting: Family Medicine

## 2021-06-13 ENCOUNTER — Other Ambulatory Visit: Payer: Self-pay

## 2021-06-13 ENCOUNTER — Encounter (HOSPITAL_COMMUNITY): Payer: Self-pay

## 2021-06-13 ENCOUNTER — Observation Stay (HOSPITAL_COMMUNITY): Payer: Medicare (Managed Care)

## 2021-06-13 DIAGNOSIS — R7989 Other specified abnormal findings of blood chemistry: Secondary | ICD-10-CM

## 2021-06-13 DIAGNOSIS — F17210 Nicotine dependence, cigarettes, uncomplicated: Secondary | ICD-10-CM | POA: Insufficient documentation

## 2021-06-13 DIAGNOSIS — I130 Hypertensive heart and chronic kidney disease with heart failure and stage 1 through stage 4 chronic kidney disease, or unspecified chronic kidney disease: Secondary | ICD-10-CM | POA: Insufficient documentation

## 2021-06-13 DIAGNOSIS — I5042 Chronic combined systolic (congestive) and diastolic (congestive) heart failure: Secondary | ICD-10-CM | POA: Insufficient documentation

## 2021-06-13 DIAGNOSIS — I152 Hypertension secondary to endocrine disorders: Secondary | ICD-10-CM | POA: Diagnosis present

## 2021-06-13 DIAGNOSIS — R001 Bradycardia, unspecified: Principal | ICD-10-CM | POA: Diagnosis present

## 2021-06-13 DIAGNOSIS — N183 Chronic kidney disease, stage 3 unspecified: Secondary | ICD-10-CM | POA: Diagnosis not present

## 2021-06-13 DIAGNOSIS — I2510 Atherosclerotic heart disease of native coronary artery without angina pectoris: Secondary | ICD-10-CM | POA: Diagnosis not present

## 2021-06-13 DIAGNOSIS — E039 Hypothyroidism, unspecified: Secondary | ICD-10-CM | POA: Insufficient documentation

## 2021-06-13 DIAGNOSIS — E1122 Type 2 diabetes mellitus with diabetic chronic kidney disease: Secondary | ICD-10-CM | POA: Insufficient documentation

## 2021-06-13 DIAGNOSIS — Z79899 Other long term (current) drug therapy: Secondary | ICD-10-CM | POA: Diagnosis not present

## 2021-06-13 DIAGNOSIS — N179 Acute kidney failure, unspecified: Secondary | ICD-10-CM

## 2021-06-13 DIAGNOSIS — N189 Chronic kidney disease, unspecified: Secondary | ICD-10-CM

## 2021-06-13 DIAGNOSIS — Z20822 Contact with and (suspected) exposure to covid-19: Secondary | ICD-10-CM | POA: Insufficient documentation

## 2021-06-13 DIAGNOSIS — E1165 Type 2 diabetes mellitus with hyperglycemia: Secondary | ICD-10-CM

## 2021-06-13 DIAGNOSIS — R55 Syncope and collapse: Secondary | ICD-10-CM

## 2021-06-13 DIAGNOSIS — R251 Tremor, unspecified: Secondary | ICD-10-CM

## 2021-06-13 DIAGNOSIS — Z794 Long term (current) use of insulin: Secondary | ICD-10-CM | POA: Diagnosis not present

## 2021-06-13 DIAGNOSIS — F1210 Cannabis abuse, uncomplicated: Secondary | ICD-10-CM | POA: Diagnosis present

## 2021-06-13 DIAGNOSIS — I480 Paroxysmal atrial fibrillation: Secondary | ICD-10-CM | POA: Diagnosis present

## 2021-06-13 DIAGNOSIS — J449 Chronic obstructive pulmonary disease, unspecified: Secondary | ICD-10-CM | POA: Diagnosis present

## 2021-06-13 DIAGNOSIS — R531 Weakness: Secondary | ICD-10-CM | POA: Diagnosis present

## 2021-06-13 DIAGNOSIS — I739 Peripheral vascular disease, unspecified: Secondary | ICD-10-CM | POA: Diagnosis present

## 2021-06-13 DIAGNOSIS — I5033 Acute on chronic diastolic (congestive) heart failure: Secondary | ICD-10-CM | POA: Diagnosis present

## 2021-06-13 DIAGNOSIS — Z720 Tobacco use: Secondary | ICD-10-CM | POA: Diagnosis present

## 2021-06-13 DIAGNOSIS — R739 Hyperglycemia, unspecified: Secondary | ICD-10-CM

## 2021-06-13 DIAGNOSIS — I10 Essential (primary) hypertension: Secondary | ICD-10-CM | POA: Diagnosis present

## 2021-06-13 LAB — BASIC METABOLIC PANEL
Anion gap: 11 (ref 5–15)
Anion gap: 10 (ref 5–15)
BUN: 57 mg/dL — ABNORMAL HIGH (ref 6–20)
BUN: 58 mg/dL — ABNORMAL HIGH (ref 6–20)
CO2: 22 mmol/L (ref 22–32)
CO2: 25 mmol/L (ref 22–32)
Calcium: 8 mg/dL — ABNORMAL LOW (ref 8.9–10.3)
Calcium: 8.1 mg/dL — ABNORMAL LOW (ref 8.9–10.3)
Chloride: 98 mmol/L (ref 98–111)
Chloride: 102 mmol/L (ref 98–111)
Creatinine, Ser: 2.5 mg/dL — ABNORMAL HIGH (ref 0.44–1.00)
Creatinine, Ser: 2.4 mg/dL — ABNORMAL HIGH (ref 0.44–1.00)
GFR, Estimated: 22 mL/min — ABNORMAL LOW (ref >60)
GFR, Estimated: 23 mL/min — ABNORMAL LOW (ref >60)
Glucose, Bld: 519 mg/dL (ref 70–99)
Glucose, Bld: 194 mg/dL — ABNORMAL HIGH (ref 70–99)
Potassium: 4.6 mmol/L (ref 3.5–5.1)
Potassium: 4.2 mmol/L (ref 3.5–5.1)
Sodium: 131 mmol/L — ABNORMAL LOW (ref 135–145)
Sodium: 137 mmol/L (ref 135–145)

## 2021-06-13 LAB — CBG MONITORING, ED
Glucose-Capillary: 486 mg/dL — ABNORMAL HIGH (ref 70–99)
Glucose-Capillary: 197 mg/dL — ABNORMAL HIGH (ref 70–99)
Glucose-Capillary: 204 mg/dL — ABNORMAL HIGH (ref 70–99)

## 2021-06-13 LAB — RESP PANEL BY RT-PCR (FLU A&B, COVID) ARPGX2
Influenza A by PCR: NEGATIVE
Influenza B by PCR: NEGATIVE
SARS Coronavirus 2 by RT PCR: NEGATIVE

## 2021-06-13 LAB — CBC
HCT: 31.5 % — ABNORMAL LOW (ref 36.0–46.0)
Hemoglobin: 9.6 g/dL — ABNORMAL LOW (ref 12.0–15.0)
MCH: 27.4 pg (ref 26.0–34.0)
MCHC: 30.5 g/dL (ref 30.0–36.0)
MCV: 90 fL (ref 80.0–100.0)
Platelets: 263 10*3/uL (ref 150–400)
RBC: 3.5 MIL/uL — ABNORMAL LOW (ref 3.87–5.11)
RDW: 16.2 % — ABNORMAL HIGH (ref 11.5–15.5)
WBC: 11.1 10*3/uL — ABNORMAL HIGH (ref 4.0–10.5)
nRBC: 0 % (ref 0.0–0.2)

## 2021-06-13 LAB — TROPONIN I (HIGH SENSITIVITY)
Troponin I (High Sensitivity): 16 ng/L (ref <18)
Troponin I (High Sensitivity): 16 ng/L (ref <18)

## 2021-06-13 MED ORDER — FLUOXETINE HCL 10 MG PO CAPS
10.0000 mg | ORAL_CAPSULE | Freq: Every day | ORAL | Status: DC
  Administered 2021-06-13: 10 mg via ORAL
  Filled 2021-06-13 (×2): qty 1

## 2021-06-13 MED ORDER — INSULIN ASPART 100 UNIT/ML IJ SOLN
0.0000 [IU] | Freq: Three times a day (TID) | INTRAMUSCULAR | Status: DC
  Administered 2021-06-14: 7 [IU] via SUBCUTANEOUS
  Administered 2021-06-14: 15 [IU] via SUBCUTANEOUS

## 2021-06-13 MED ORDER — ALBUTEROL SULFATE HFA 108 (90 BASE) MCG/ACT IN AERS: 2.0000 | INHALATION_SPRAY | RESPIRATORY_TRACT | Status: DC | PRN

## 2021-06-13 MED ORDER — CLOPIDOGREL BISULFATE 75 MG PO TABS
75.0000 mg | ORAL_TABLET | Freq: Every day | ORAL | Status: DC
  Administered 2021-06-14: 75 mg via ORAL
  Filled 2021-06-13: qty 1

## 2021-06-13 MED ORDER — ACETAMINOPHEN 325 MG PO TABS
650.0000 mg | ORAL_TABLET | Freq: Four times a day (QID) | ORAL | Status: DC | PRN
  Administered 2021-06-14: 650 mg via ORAL
  Filled 2021-06-13: qty 2

## 2021-06-13 MED ORDER — NORTRIPTYLINE HCL 25 MG PO CAPS
50.0000 mg | ORAL_CAPSULE | Freq: Two times a day (BID) | ORAL | Status: DC
  Administered 2021-06-13 – 2021-06-14 (×2): 50 mg via ORAL
  Filled 2021-06-13 (×3): qty 2

## 2021-06-13 MED ORDER — AMIODARONE HCL 100 MG PO TABS
100.0000 mg | ORAL_TABLET | Freq: Every day | ORAL | Status: DC
  Administered 2021-06-14: 100 mg via ORAL
  Filled 2021-06-13: qty 1

## 2021-06-13 MED ORDER — AMLODIPINE BESYLATE 10 MG PO TABS
10.0000 mg | ORAL_TABLET | Freq: Every day | ORAL | Status: DC
  Administered 2021-06-14: 10 mg via ORAL
  Filled 2021-06-13: qty 1

## 2021-06-13 MED ORDER — EMPAGLIFLOZIN 10 MG PO TABS
10.0000 mg | ORAL_TABLET | Freq: Every day | ORAL | Status: DC
  Administered 2021-06-14: 10 mg via ORAL
  Filled 2021-06-13: qty 1

## 2021-06-13 MED ORDER — ACETAMINOPHEN 650 MG RE SUPP: 650.0000 mg | Freq: Four times a day (QID) | RECTAL | Status: DC | PRN

## 2021-06-13 MED ORDER — FOLIC ACID 1 MG PO TABS
1.0000 mg | ORAL_TABLET | Freq: Every day | ORAL | Status: DC
  Administered 2021-06-14: 1 mg via ORAL
  Filled 2021-06-13: qty 1

## 2021-06-13 MED ORDER — NICOTINE 7 MG/24HR TD PT24
7.0000 mg | MEDICATED_PATCH | Freq: Every day | TRANSDERMAL | Status: DC
  Administered 2021-06-13 – 2021-06-14 (×2): 7 mg via TRANSDERMAL
  Filled 2021-06-13 (×2): qty 1

## 2021-06-13 MED ORDER — HYDRALAZINE HCL 25 MG PO TABS
25.0000 mg | ORAL_TABLET | Freq: Three times a day (TID) | ORAL | Status: DC
  Administered 2021-06-13 – 2021-06-14 (×2): 25 mg via ORAL
  Filled 2021-06-13 (×2): qty 1

## 2021-06-13 MED ORDER — LOSARTAN POTASSIUM 50 MG PO TABS
100.0000 mg | ORAL_TABLET | Freq: Every day | ORAL | Status: DC
  Administered 2021-06-14: 100 mg via ORAL
  Filled 2021-06-13: qty 2

## 2021-06-13 MED ORDER — APIXABAN 5 MG PO TABS
5.0000 mg | ORAL_TABLET | Freq: Two times a day (BID) | ORAL | Status: DC
  Administered 2021-06-13 – 2021-06-14 (×2): 5 mg via ORAL
  Filled 2021-06-13 (×2): qty 1

## 2021-06-13 MED ORDER — INSULIN ASPART 100 UNIT/ML IJ SOLN: 0.0000 [IU] | Freq: Every day | INTRAMUSCULAR | Status: DC

## 2021-06-13 MED ORDER — ATORVASTATIN CALCIUM 40 MG PO TABS
40.0000 mg | ORAL_TABLET | Freq: Every day | ORAL | Status: DC
  Administered 2021-06-14: 40 mg via ORAL
  Filled 2021-06-13: qty 1

## 2021-06-13 MED ORDER — SODIUM CHLORIDE 0.9 % IV BOLUS
1000.0000 mL | Freq: Once | INTRAVENOUS | Status: AC
  Administered 2021-06-13: 1000 mL via INTRAVENOUS

## 2021-06-13 MED ORDER — ALBUTEROL SULFATE (2.5 MG/3ML) 0.083% IN NEBU: 2.5000 mg | INHALATION_SOLUTION | RESPIRATORY_TRACT | Status: DC | PRN

## 2021-06-13 MED ORDER — INSULIN GLARGINE-YFGN 100 UNIT/ML ~~LOC~~ SOLN
20.0000 [IU] | Freq: Every day | SUBCUTANEOUS | Status: DC
  Administered 2021-06-14: 20 [IU] via SUBCUTANEOUS
  Filled 2021-06-13 (×2): qty 0.2

## 2021-06-13 MED ORDER — LEVOTHYROXINE SODIUM 50 MCG PO TABS
50.0000 ug | ORAL_TABLET | Freq: Every day | ORAL | Status: DC
  Administered 2021-06-14: 50 ug via ORAL
  Filled 2021-06-13: qty 1

## 2021-06-13 MED ORDER — LACTATED RINGERS IV SOLN: INTRAVENOUS | Status: DC

## 2021-06-13 MED ORDER — SPIRONOLACTONE 25 MG PO TABS
25.0000 mg | ORAL_TABLET | Freq: Every day | ORAL | Status: DC
  Administered 2021-06-14: 25 mg via ORAL
  Filled 2021-06-13: qty 1

## 2021-06-13 MED ORDER — INSULIN ASPART 100 UNIT/ML IJ SOLN
15.0000 [IU] | Freq: Once | INTRAMUSCULAR | Status: AC
  Administered 2021-06-13: 15 [IU] via INTRAVENOUS

## 2021-06-13 MED ORDER — MOMETASONE FURO-FORMOTEROL FUM 200-5 MCG/ACT IN AERO
2.0000 | INHALATION_SPRAY | Freq: Two times a day (BID) | RESPIRATORY_TRACT | Status: DC
  Administered 2021-06-13 – 2021-06-14 (×2): 2 via RESPIRATORY_TRACT
  Filled 2021-06-13: qty 8.8

## 2021-06-13 MED ORDER — PANTOPRAZOLE SODIUM 40 MG PO TBEC
40.0000 mg | DELAYED_RELEASE_TABLET | Freq: Every day | ORAL | Status: DC
  Administered 2021-06-14: 40 mg via ORAL
  Filled 2021-06-13: qty 1

## 2021-06-13 NOTE — H&P (Signed)
History and Physical    [name] [id number] DOB: 1961-09-16 DOA: 06/13/2021  PCP: [facility] [place]   Patient coming from: Home  Chief Complaint: Weakness in legs with lightheadedness.   HPI: [name] is a 60 y.o. female with medical history significant for CAD, Chronic combined CHF, COPD, DMT2, CKD 3, HTN, PAF, PAD, tobacco abuse, marijuana abuse who presents for evaluation of weakness in her legs and lightheadedness.  Patient states she was at home with her sister and had smoked some marijuana which she does on a daily basis when she started to walk to the door and felt her legs were to give out on her.  She became very tremulous and she had to sit down to rest.  She did not have any chest pain, pressure or palpitations.  She denies having any shortness of breath, nausea, vomiting, diarrhea.  She states that she felt she was in a pass out but she did not lose consciousness.  Patient was admitted last week by cardiology service for ischemia of her right foot and had a drug-coated balloon angioplasty of her right SFA.  She has been doing well at home with no leg pain since discharge 5 days ago. She continues to smoke half a pack of cigarettes a day.  She admits to smoking marijuana daily  ED Course: In the emergency room patient's blood pressures remained stable.  He was noted to have bradycardia with a pulse in the 40s.  She has had symptomatic bradycardia in the past.  Transcutaneous pacer pads were placed on patient.  Initial troponin was negative at 19 and repeat value a few hours later was unchanged at 19.  Cardiology has been consulted by the ER physician and neurology recommendations are pending.  Patient was noted to be significantly hyperglycemic with blood sugar over 500.  She also had worsening of her renal function with AKI on CKD with a creatinine increasing to 2.5 from a baseline of 1.6.  Sodium 137, potassium 4.2, calcium 8.1, chloride 102, bicarb 25.  WBC 11,100,  hemoglobin 9.6, hematocrit 31.5, platelets 263,000.  COVID swab is pending.  Hospitalist service was asked to place patient on telemetry floor for observation overnight  Review of Systems:  General: Reports near syncope and generalized weakness. Denies fever, chills, weight loss, night sweats.  Denies dizziness.  Denies change in appetite HENT: Denies head trauma, headache, denies change in hearing, tinnitus.  Denies nasal congestion or bleeding.  Denies sore throat, sores in mouth.  Denies difficulty swallowing Eyes: Denies blurry vision, pain in eye, drainage.  Denies discoloration of eyes. Neck: Denies pain.  Denies swelling.  Denies pain with movement. Cardiovascular: Denies chest pain, palpitations.  Has chronic edema.  Denies orthopnea Respiratory: Denies shortness of breath, cough.  Denies wheezing.  Denies sputum production Gastrointestinal: Denies abdominal pain, swelling.  Denies nausea, vomiting, diarrhea.  Denies melena.  Denies hematemesis. Musculoskeletal: Denies limitation of movement.  Denies deformity or swelling.  Denies pain.  Denies arthralgias or myalgias. Genitourinary: Denies pelvic pain.  Denies urinary hesitancy.  Denies dysuria.  Skin: Denies rash.  Denies petechiae, purpura, ecchymosis. Neurological: Denies syncope.  Denies seizure activity. Denies slurred speech, drooping face.  Denies visual change. Psychiatric: Denies depression, anxiety. Denies hallucinations.  Past Medical History:  Diagnosis Date   Alcohol abuse    Alcoholic cirrhosis ([place])    Anemia    Anxiety    Arthritis    "knees" (11/26/2018)   B12 deficiency    CAD (  coronary artery disease)    a. 11/10/2014 Cath: LM nl, LAD min irregs, D1 30 ost, D2 50d, LCX 2m OM1 80 p/m (1.5 mm vessel), OM2 430mRCA nondom 9080mmed rx.. Demand ischemia in the setting of rapid a-fib.   Cardiomyopathy ([place]  Carotid artery disease ([place]  a. 01/2015 Carotid Angio: RICA [id number]. [id number] L CEA; c.  05/2019 Carotid U/S: RICA 100. RECA >50. LICA [id number] Cellulitis    lower extremities   CHF (congestive heart failure) ([name])    Chronic combined systolic and diastolic CHF (congestive heart failure) ([facility]CC[place]  a. 10/2014 Echo: EF 40-45%; b. 10/2018 Echo: EF 45-50%, gr2 DD; c. 11/2019 Echo: EF 50%, mild LVH, gr2 DD (restrictive), antlat HK, Nl RV fxn. Mild BAE. RVSP 59.9mm71m   CKD (chronic kidney disease), stage III ([name])    Cocaine abuse ([name])    COPD (chronic obstructive pulmonary disease) ([name])    Critical lower limb ischemia ([name])    Depression    Diabetes mellitus without complication ([name])    Diabetic peripheral neuropathy ([name])    DVT (deep venous thrombosis) ([name])    Dyspnea    Elevated troponin    a. Chronic elevation.   GERD (gastroesophageal reflux disease)    Hyperlipemia    Hypertension    Hypokalemia    Hypomagnesemia    Hypothyroidism    Marijuana abuse    Narcotic abuse ([name])[place] Noncompliance    NSVT (nonsustained ventricular tachycardia) ([name])    Obesity    PAF (paroxysmal atrial fibrillation) ([name])    Paroxysmal atrial tachycardia ([name])    Peripheral arterial disease ([name])[place] a. 01/2015 Angio/PTA: RSFA 99 (atherectomy/pta) - 1 vessel runoff via diff dzs peroneal; b. 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   Pneumonia    "once or twice" (11/26/2018)   Poorly controlled type 2 diabetes mellitus ([name])[place] Renal disorder    Renal insufficiency    a. 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   Sleep apnea    "couldn't handle wearing the mask" (11/26/2018)   Symptomatic bradycardia    a. Avoid AV blocking agent per EP. Prev req temp wire in 2017.   Tobacco abuse     Past Surgical History:  Procedure Laterality Date   ABDOMINAL AORTOGRAM N/A 06/26/2019   Procedure: ABDOMINAL AORTOGRAM;  Surgeon: AridWellington Hampshire;  [facility];  Service: Cardiovascular;  Laterality: N/A;   ABDOMINAL AORTOGRAM W/LOWER EXTREMITY N/A 06/07/2021   Procedure:  ABDOMINAL AORTOGRAM W/LOWER EXTREMITY;  Surgeon: BerrLorretta Harp;  [facility];  Service: Cardiovascular;  Laterality: N/A;   AMPUTATION Right 06/14/2017   Procedure: Right foot transmetatarsal amputation;  Surgeon: DudaNewt Minion;  [facility]ics;  Laterality: Right;   AMPUTATION Left 06/28/2019   Procedure: AMPUTATION LEFT FIFTH TOE;  Surgeon: EarlRosetta Posner;  [facility] Vascular;  Laterality: Left;   AMPUTATION TOE Right 04/28/2017   Procedure: AMPUTATION OF RIGHT SECOND RAY;  Surgeon: DudaNewt Minion;  [facility];  Laterality: Right;   CARDIAC CATHETERIZATION     CARDIAC CATHETERIZATION N/A 01/12/2016   Procedure: Temporary Wire;  Surgeon: JameMinus Breeding;  [facility];  Service: Cardiovascular;  Laterality: N/A;   CARDIOVERSION  ~ 02/2013   "twice"  CAROTID ANGIOGRAM N/A 01/15/2015   Procedure: CAROTID ANGIOGRAM;  Surgeon: [name], MD;  Location: [facility] CATH LAB;  Service: Cardiovascular;  Laterality: N/A;   DILATION AND CURETTAGE OF UTERUS  1988   ENDARTERECTOMY Left 02/19/2015   Procedure: LEFT CAROTID ENDARTERECTOMY ;  Surgeon: Serafina Mitchell, MD;  Location: [place];  Service: Vascular;  Laterality: Left;   FEMORAL-TIBIAL BYPASS GRAFT Left 06/28/2019   Procedure: BYPASS GRAFT LEFT LEG FEMORAL TO ANTERIOR TIBIAL ARTERY using LEFT GREATER SAPHENOUS VEIN;  Surgeon: Rosetta Posner, MD;  Location: [place];  Service: Vascular;  Laterality: Left;   LEFT HEART CATHETERIZATION WITH CORONARY ANGIOGRAM N/A 10/31/2014   Procedure: LEFT HEART CATHETERIZATION WITH CORONARY ANGIOGRAM;  Surgeon: Burnell Blanks, MD;  Location[facility] CATH LAB;  Service: Cardiovascular;  Laterality: N/A;   LOWER EXTREMITY ANGIOGRAM N/A 09/10/2013   Procedure: LOWER EXTREMITY ANGIOGRAM;  Surgeon: [name], MD;  Location: [facility] CATH LAB;  Service: Cardiovascular;  Laterality: N/A;   LOWER EXTREMITY  ANGIOGRAM N/A 01/15/2015   Procedure: LOWER EXTREMITY ANGIOGRAM;  Surgeon: [name], MD;  Location: [facility] CATH LAB;  Service: Cardiovascular;  Laterality: N/A;   LOWER EXTREMITY ANGIOGRAPHY N/A 04/13/2017   Procedure: Lower Extremity Angiography;  Surgeon: [name], MD;  Location: [place] CV LAB;  Service: Cardiovascular;  Laterality: N/A;   LOWER EXTREMITY ANGIOGRAPHY Left 06/26/2019   Procedure: LOWER EXTREMITY ANGIOGRAPHY;  Surgeon: Wellington Hampshire, MD;  Location: [place] CV LAB;  Service: Cardiovascular;  Laterality: Left;   PERIPHERAL VASCULAR ATHERECTOMY Right 06/07/2021   Procedure: PERIPHERAL VASCULAR ATHERECTOMY;  Surgeon: [name], MD;  Location: [place] CV LAB;  Service: Cardiovascular;  Laterality: Right;   PERIPHERAL VASCULAR BALLOON ANGIOPLASTY Left 06/26/2019   Procedure: PERIPHERAL VASCULAR BALLOON ANGIOPLASTY;  Surgeon: Wellington Hampshire, MD;  Location: [place] CV LAB;  Service: Cardiovascular;  Laterality: Left;  unable to cross lt sfa occlusion   PERIPHERAL VASCULAR INTERVENTION  04/13/2017   Procedure: Peripheral Vascular Intervention;  Surgeon: [name], MD;  Location: [place] CV LAB;  Service: Cardiovascular;;   PRESSURE SENSOR/CARDIOMEMS N/A 02/05/2020   Procedure: PRESSURE SENSOR/CARDIOMEMS;  Surgeon: Larey Dresser, MD;  Location: [place] CV LAB;  Service: Cardiovascular;  Laterality: N/A;   VEIN HARVEST Left 06/28/2019   Procedure: LEFT LEG GREATER SAPHENOUS VEIN HARVEST;  Surgeon: Rosetta Posner, MD;  [facility];  Service: Vascular;  Laterality: Left;    Social History  reports that she has been smoking e-cigarettes and cigarettes. She has a 44.00 pack-year smoking history. She has quit using smokeless tobacco.  Her smokeless tobacco use included snuff. She reports current alcohol use. She reports current drug use. Drugs: "Crack" cocaine, Marijuana, and Oxycodone.  Allergies  Allergen Reactions   Gabapentin  Nausea And Vomiting and Other (See Comments)    POSSIBLE SHAKING   Lyrica [Pregabalin] Other (See Comments)    Shaking     Family History  Problem Relation Age of Onset   Hypertension Mother    Diabetes Mother    Cancer Mother        breast, ovarian, colon   Clotting disorder Mother    Heart disease Mother    Heart attack Mother    Breast cancer Mother        in 85's   Hypertension Father    Heart disease Father    Emphysema Sister        smoked     Prior  to Admission medications   Medication Sig Start Date End Date Taking? Authorizing Provider  acetaminophen (TYLENOL) 500 MG tablet Take 500 mg by mouth every 6 (six) hours as needed for moderate pain.   Yes [provider]  albuterol (VENTOLIN HFA) 108 (90 Base) MCG/ACT inhaler Inhale 2 puffs into the lungs every 6 (six) hours as needed for wheezing or shortness of breath. 07/08/19  Yes Oretha Milch D, MD  amiodarone (PACERONE) 200 MG tablet Take 0.5 tablets (100 mg total) by mouth daily. 10/15/20  Yes Larey Dresser, MD  apixaban (ELIQUIS) 5 MG TABS tablet Take 5 mg by mouth 2 (two) times daily.   Yes [provider]  atorvastatin (LIPITOR) 40 MG tablet Take 40 mg by mouth daily.   Yes [provider]  bisoprolol (ZEBETA) 5 MG tablet Take 1 tablet (5 mg total) by mouth daily. 10/15/20  Yes Larey Dresser, MD  budesonide-formoterol [facility]) 160-4.5 MCG/ACT inhaler Inhale 2 puffs into the lungs 2 (two) times daily. 12/09/19  Yes Eugenie Filler, MD  clopidogrel (PLAVIX) 75 MG tablet Take 1 tablet (75 mg total) by mouth daily with breakfast. 06/09/21  Yes Reino Bellis B, NP  colchicine 0.6 MG tablet Take 0.6 mg by mouth daily.   Yes [provider]  Dulaglutide (TRULICITY) [id number] SOPN Inject 0.75 mg into the skin every Friday.   Yes [provider]  empagliflozin (JARDIANCE) 10 MG TABS tablet Take 1 tablet (10 mg total) by mouth daily before breakfast. 04/01/21  Yes  Clegg, [name] D, NP  FLUoxetine (PROZAC) 10 MG capsule Take 1 capsule (10 mg total) by mouth at bedtime. 05/05/21  Yes Kathie Dike, MD  folic acid (FOLVITE) 1 MG tablet Take 1 mg by mouth daily.   Yes [provider]  insulin glargine (LANTUS) 100 UNIT/ML Solostar Pen INJECT 20 UNITS INTO THE SKIN DAILY. 01/05/21 01/05/22 Yes Allie Bossier, MD  insulin lispro (HUMALOG) 100 UNIT/ML KwikPen Inject 8 Units into the skin 3 (three) times daily with meals. 01/05/21  Yes Allie Bossier, MD  levothyroxine (SYNTHROID, LEVOTHROID) 50 MCG tablet Take 1 tablet (50 mcg total) by mouth daily. 08/09/17  Yes Medina-Vargas, Monina C, NP  losartan (COZAAR) 100 MG tablet Take 100 mg by mouth daily.   Yes [provider]  ACCU-CHEK GUIDE test strip USE TO [place] DAILY 02/25/20   [provider]  amLODipine (NORVASC) 10 MG tablet Take 1 tablet (10 mg total) by mouth daily. 10/15/20   Larey Dresser, MD  hydrALAZINE (APRESOLINE) 25 MG tablet Take 1 tablet (25 mg total) by mouth 3 (three) times daily. 03/08/21   Larey Dresser, MD  insulin aspart (NOVOLOG) 100 UNIT/ML FlexPen INJECT 8 UNITS INTO THE SKIN THREE TIMES DAILY WITH MEALS. 01/05/21 01/05/22  Allie Bossier, MD  Insulin Pen Needle 32G X 4 MM MISC USE AS DIRECTED WITH INSULIN PENS 01/05/21 01/05/22  Allie Bossier, MD  nortriptyline (PAMELOR) 50 MG capsule Take 50 mg by mouth 2 (two) times daily.    [provider]  Omega-3 Fatty Acids (FISH OIL PO) Take 1 capsule by mouth daily.    [provider]  pantoprazole (PROTONIX) 40 MG tablet Take 1 tablet (40 mg total) by mouth daily. 02/21/20   Larey Dresser, MD  spironolactone (ALDACTONE) 25 MG tablet Take 1 tablet (25 mg total) by mouth daily. 05/27/21   Larey Dresser, MD  torsemide (DEMADEX) 20 MG  tablet Take 4 tablets (80 mg total) by mouth every morning AND 3 tablets (60 mg total) every evening. 03/08/21   Larey Dresser, MD  VITAMIN D PO Take 1  tablet by mouth daily.    [provider]  tiotropium (SPIRIVA HANDIHALER) 18 MCG inhalation capsule Place 1 capsule (18 mcg total) into inhaler and inhale every morning. Patient not taking: Reported on 02/09/2021 12/09/19 02/09/21  Eugenie Filler, MD    Physical Exam: Vitals:   06/13/21 1645 06/13/21 1700 06/13/21 1730 06/13/21 1915  BP:  (!) 140/49 131/64 (!) 130/55  Pulse:  (!) 43 (!) 44 (!) 43  Resp: (!) 24 12 (!) 21 (!) 25  Temp:      TempSrc:      SpO2: 92% 93% 94% 93%    Constitutional: NAD, calm, comfortable Vitals:   06/13/21 1645 06/13/21 1700 06/13/21 1730 06/13/21 1915  BP:  (!) 140/49 131/64 (!) 130/55  Pulse:  (!) 43 (!) 44 (!) 43  Resp: (!) 24 12 (!) 21 (!) 25  Temp:      TempSrc:      SpO2: 92% 93% 94% 93%   General: WDWN, Alert and oriented x3.  Eyes: EOMI, PERRL, conjunctivae normal.  Sclera nonicteric HENT:  [place]/AT, external ears normal.  Nares patent without epistasis.  Mucous membranes are moist.  Neck: Soft, normal range of motion, supple, no masses, Trachea midline Respiratory: Equal breath sounds with diffuse scattered rales and mild expiratory wheezing. no crackles. Normal respiratory effort. No accessory muscle use.  Cardiovascular: Sinus rhythm with bradycardia, no murmurs / rubs / gallops. Has lower extremity edema.  Transcutaneous pacer pads in place Abdomen: Soft, no tenderness, nondistended, no rebound or guarding.  No masses palpated. Bowel sounds normoactive Musculoskeletal: FROM. no cyanosis. Right leg with wrapping and protective show on foot. No joint deformity upper and lower extremities. Normal muscle tone.  Skin: Warm, dry, intact no rashes, lesions, ulcers. No induration Neurologic: CN 2-12 grossly intact.  Normal speech.  Sensation intact, Strength 5/5 in all extremities.   Psychiatric: Normal judgment and insight.  Normal mood.    Labs on Admission: I have personally reviewed following labs and imaging studies  CBC: Recent  Labs  Lab 06/08/21 0228 06/13/21 1608  WBC 12.0* 11.1*  HGB 10.5* 9.6*  HCT 35.0* 31.5*  MCV 90.0 90.0  PLT 272 [id number]    Basic Metabolic Panel: Recent Labs  Lab 06/08/21 0228 06/13/21 1608 06/13/21 1911  NA 136 131* 137  K 3.8 4.6 4.2  CL 98 98 102  CO2 '28 22 25  '$ GLUCOSE 308* 519* 194*  BUN 28* 57* 58*  CREATININE 1.60* 2.50* 2.40*  CALCIUM 8.6* 8.0* 8.1*    GFR: Estimated Creatinine Clearance: 29.5 mL/min (A) (by C-G formula based on SCr of 2.4 mg/dL (H)).  Liver Function Tests: No results for input(s): AST, ALT, ALKPHOS, BILITOT, PROT, ALBUMIN in the last 168 hours.  Urine analysis:    Component Value Date/Time   COLORURINE STRAW (A) 02/23/2021 0101   APPEARANCEUR CLEAR 02/23/2021 0101   LABSPEC 1.008 02/23/2021 0101   PHURINE 5.0 02/23/2021 0101   GLUCOSEU NEGATIVE 02/23/2021 0101   HGBUR NEGATIVE 02/23/2021 0101   BILIRUBINUR NEGATIVE 02/23/2021 0101   KETONESUR NEGATIVE 02/23/2021 0101   PROTEINUR NEGATIVE 02/23/2021 0101   UROBILINOGEN 0.2 09/05/2015 0012   NITRITE NEGATIVE 02/23/2021 0101   LEUKOCYTESUR NEGATIVE 02/23/2021 0101    Radiological Exams on Admission: No results found.  EKG: Independently reviewed.  EKG shows sinus bradycardia with a heart rate of 45.  No acute ST elevation or depression.  QTc 475  Assessment/Plan Principal Problem:   Bradycardia Ms. Sundara is placed on cardiac telemetry floor for observation.  She had two negative troponin levels in ER and has no chest pain or pressure. No signs of ischemia. BP is stable.  Cardiology consulted and awaiting their recommendations. Transcutaneous pacemaker placed on patient.  Check TSH level  Active Problems:   Uncontrolled type 2 diabetes mellitus with hyperglycemia  She has poorly controlled diabetes mellitus with a hemoglobin A1c of 9.7 on May 04, 2021.  Reports she has been taking NovoLog 8 units with meals.  Blood sugar was greater than 500 when she arrived to emergency  room. Resume Lantus 20 units daily.  Blood sugars were monitored with meals and at bedtime and corrective insulin provided as needed for glycemic control.  Patient will need diabetes education before discharge    Acute kidney injury superimposed on CKD Creatinine is increased to 2.5 from a baseline of 1.6.  Gentle IV fluid hydration with LR at 75 MLS per hour overnight Check electrolytes and renal function in morning with labs    Chronic combined systolic and diastolic CHF (congestive heart failure)  Patient with no signs of fluid overload at this time.  Monitor volume status closely.  Daily I&Os    Essential hypertension Continue home medications of norvasc, losartan, hydralazine. Hold beta blocker with bradycardia    COPD Symbicort substituted with Dulera due to hospital formulary.  Albuterol MDI as needed.  Incentive spirometer every 2 hours while awake    PAD (peripheral artery disease) Chronic.  Continue Plavix    Marijuana abuse Chronic    Tobacco abuse Chronic.  Nicotine patch to prevent withdrawal in the hospital.  Patient encouraged to discontinue smoking    AF (paroxysmal atrial fibrillation) Continue amiodarone.  Continue Eliquis    Morbid obesity Follow-up with PCP for dietary, lifestyle, possible pharmacotherapy interventions and/or referral for bariatric surgery for definitive weight loss     DVT prophylaxis: Is anticoagulated on Eliquis which will be continued. Code Status:   Full code Family Communication:  Diagnosis and plan discussed with patient.  She verbalized understanding agrees with plan.  Further recommendations to follow as clinical indicated Disposition Plan:   Patient is from:  Home  Anticipated DC to:  Home  Anticipated DC date:  Anticipate less than 2 midnight stay  Consults called:  Cardiology  Admission status:  Observation   Yevonne Aline Miki Labuda MD [facility]  How to contact the [facility] Attending or Consulting provider [place] or  covering provider during after hours [place], for this patient?   Check the care team in [facility] and look for a) attending/consulting [facility] provider listed and b) the [facility] team listed Log into [email or web address] and use [place]'s universal password to access. If you do not have the password, please contact the hospital operator. Locate the [facility] provider you are looking for under [facility] and page to a number that you can be directly reached. If you still have difficulty reaching the provider, please page the [facility] (Director on Call) for the Hospitalists listed on amion for assistance.  06/13/2021, 9:25 PM

## 2021-06-13 NOTE — ED Triage Notes (Signed)
PT BIB EMS due to bradycardia. Pt smoked weed and took regular meds and started to feel bad. EMS states BS is high. Pt is axox4.

## 2021-06-13 NOTE — ED Provider Notes (Signed)
[facility] EMERGENCY DEPARTMENT Provider Note   CSN: [id number] Arrival date & time: 06/13/21  1528     History Chief Complaint  Patient presents with   Weakness   Hyperglycemia   Bradycardia    [name] is a 60 y.o. female with history of coronary disease, cardiomyopathy, presenting the emergency department with complaint of leg weakness and lightheadedness.  The patient ports she was at home with her sister today.  She reports that she was smoking marijuana, which she does regularly.  She reports he was walking to the door and suddenly her legs felt like they are both weak and tremulous.  She reports a very light mild lightheadedness.  She denies chest pain or palpitations.  She was discharged from the hospital 2 days ago after being medically admitted for critical ischemia of the right foot.  She had drug-coated balloon angioplasty of the right SFA  HPI     Past Medical History:  Diagnosis Date   Alcohol abuse    Alcoholic cirrhosis ([place])    Anemia    Anxiety    Arthritis    "knees" (11/26/2018)   B12 deficiency    CAD (coronary artery disease)    a. 11/10/2014 Cath: LM nl, LAD min irregs, D1 30 ost, D2 50d, LCX 98m OM1 80 p/m (1.5 mm vessel), OM2 466mRCA nondom 9042mmed rx.. Demand ischemia in the setting of rapid a-fib.   Cardiomyopathy (HCC[place]by  Carotid artery disease (HCCStonewall  a. 01/2015 Carotid Angio: RICA [id number] L CEA; c. 05/2019 Carotid U/S: RICA 100. RECA >50. LICA [id number] Cellulitis    lower extremities   CHF (congestive heart failure) ([name])    Chronic combined systolic and diastolic CHF (congestive heart failure) (HCCAleutians West  a. 10/2014 Echo: EF 40-45%; b. 10/2018 Echo: EF 45-50%, gr2 DD; c. 11/2019 Echo: EF 50%, mild LVH, gr2 DD (restrictive), antlat HK, Nl RV fxn. Mild BAE. RVSP 59.9mm47m   CKD (chronic kidney disease), stage III ([name])    Cocaine abuse ([name])    COPD (chronic obstructive pulmonary disease) ([name])     Critical lower limb ischemia ([name])    Depression    Diabetes mellitus without complication ([name])    Diabetic peripheral neuropathy ([name])    DVT (deep venous thrombosis) ([name])    Dyspnea    Elevated troponin    a. Chronic elevation.   GERD (gastroesophageal reflux disease)    Hyperlipemia    Hypertension    Hypokalemia    Hypomagnesemia    Hypothyroidism    Marijuana abuse    Narcotic abuse ([name])[place] Noncompliance    NSVT (nonsustained ventricular tachycardia) ([name])    Obesity    PAF (paroxysmal atrial fibrillation) ([name])    Paroxysmal atrial tachycardia ([name])    Peripheral arterial disease ([name])[place] a. 01/2015 Angio/PTA: RSFA 99 (atherectomy/pta) - 1 vessel runoff via diff dzs peroneal; b. 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   Pneumonia    "once or twice" (11/26/2018)   Poorly controlled type 2 diabetes mellitus ([name])[place] Renal disorder    Renal insufficiency    a. 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   Sleep apnea    "couldn't handle wearing the mask" (11/26/2018)   Symptomatic bradycardia    a. Avoid AV blocking agent per EP. Prev req temp wire in 2017.   Tobacco abuse  Patient Active Problem List   Diagnosis Date Noted   Bradycardia 06/13/2021   Uncontrolled type 2 diabetes mellitus with hyperglycemia ([place]) 06/13/2021   Acute kidney injury superimposed on CKD ([place]) 06/13/2021   Critical ischemia of foot ([place]) 06/07/2021   Fall 05/05/2021   PAF (paroxysmal atrial fibrillation) ([place]) 05/05/2021   COPD (chronic obstructive pulmonary disease) ([place]) 05/05/2021   DM (diabetes mellitus), type 2 with complications ([place]) [id number]   PAD (peripheral artery disease) ([place]) 05/05/2021   Cellulitis 05/05/2021   Acute on chronic combined systolic and diastolic CHF (congestive heart failure) ([employer]) 05/05/2021   OSA (obstructive sleep apnea) 05/05/2021   CKD (chronic kidney disease) stage 3, GFR 30-59 ml/min ([place]) 05/05/2021   AKI (acute kidney injury) ([place]) 05/04/2021    Pressure injury of skin 05/04/2021   Ulcer of foot, unspecified laterality, limited to breakdown of skin ([place]) 02/17/2021   Altered mental status 01/02/2021   AF (paroxysmal atrial fibrillation) ([place]) 01/02/2021   CAD (coronary artery disease) 01/02/2021   PVD (peripheral vascular disease) ([place]) 01/02/2021   Acute renal failure superimposed on stage 4 chronic kidney disease ([place]) 01/02/2021   Diabetes mellitus type 2, uncontrolled, with complications ([place]) [id number]   Diabetic nephropathy ([place]) 01/02/2021   Low back pain 06/01/2020   Hyperlipidemia 04/03/2020   Muscle weakness 03/20/2020   Closed fracture of neck of right radius 03/13/2020   Pain in elbow 03/12/2020   PAD (peripheral artery disease) ([place]) 12/26/2019   Acute renal failure ([name])    Acute respiratory failure with hypoxia ([place]) 12/02/2019   Acute exacerbation of CHF (congestive heart failure) ([place]) 12/01/2019   Cellulitis in diabetic foot ([place]) 07/08/2019   Osteomyelitis ([place]) 06/21/2019   NICM (nonischemic cardiomyopathy) ([place]) 06/20/2019   Non-healing ulcer ([place]) 06/20/2019   Acute CHF (congestive heart failure) ([place]) 11/26/2018   CHF (congestive heart failure) ([place]) 11/26/2018   Acute respiratory failure ([place]) 10/21/2018   AKI (acute kidney injury) ([place]) 08/18/2018   Coagulation disorder ([place]) 08/09/2017   Depression 07/21/2017   At risk for adverse drug reaction 06/20/2017   Peripheral neuropathy 06/20/2017   Acute osteomyelitis of right foot ([place]) 06/13/2017   S/P transmetatarsal amputation of foot, right ([place]) 06/05/2017   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 ([place]) 05/19/2017   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[place])    SIRS (systemic inflammatory response syndrome) ([place]) 04/06/2017   CKD (chronic kidney disease), stage III ([place]) 11/24/2016   Suspected sleep apnea 11/24/2016   Ulcer of toe of right foot, with necrosis of bone ([place]) 10/27/2016   Ulcer of  left lower leg ([place]) 05/19/2016   Severe obesity (BMI >= 40) ([place]) 02/24/2016   COPD GOLD 0  02/24/2016   Morbid obesity ([place]) 02/24/2016   Encounter for therapeutic drug monitoring 02/10/2016   Symptomatic bradycardia 01/12/2016   Essential hypertension 12/22/2015   Chronic combined systolic and diastolic CHF (congestive heart failure) ([name])    [place]    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 11/08/2015   Tobacco abuse 10/23/2015   Coronary artery disease    DOE (dyspnea on exertion) 04/29/2015   Diabetes mellitus type 2, uncontrolled ([place]) 02/08/2015   Influenza A 02/07/2015   Wrist fracture, left, with routine healing, subsequent encounter 02/05/2015   Wrist fracture, left, closed, initial encounter 01/29/2015   PAF (paroxysmal atrial fibrillation) ([place]) 01/16/2015   Carotid arterial disease ([place]) 01/16/2015   Claudication ([place]) 01/15/2015   Demand ischemia ([name]) 10/29/2014   Insomnia 02/03/2014   COPD with  acute exacerbation ([place]) 02/01/2014   S/P peripheral artery angioplasty - [place] atherectomy; R SFA 09/11/2013    Class: Acute   Leg pain, bilateral 08/19/2013   Hypothyroidism 07/31/2013   Cellulitis 06/13/2013   History of cocaine abuse ([place]) 06/13/2013   Long term current use of anticoagulant therapy 05/20/2013   Alcohol abuse    Narcotic abuse ([place])    Marijuana abuse    Alcoholic cirrhosis ([place])    DM (diabetes mellitus), type 2 with peripheral vascular complications [facility])     Past Surgical History:  Procedure Laterality Date   ABDOMINAL AORTOGRAM N/A 06/26/2019   Procedure: ABDOMINAL AORTOGRAM;  Surgeon: Wellington Hampshire, MD;  Location: [place] CV LAB;  Service: Cardiovascular;  Laterality: N/A;   ABDOMINAL AORTOGRAM W/LOWER EXTREMITY N/A 06/07/2021   Procedure: ABDOMINAL AORTOGRAM W/LOWER EXTREMITY;  Surgeon: [name], MD;  Location: [place] CV LAB;  Service: Cardiovascular;  Laterality: N/A;   AMPUTATION Right 06/14/2017   Procedure: Right foot  transmetatarsal amputation;  Surgeon: Newt Minion, MD;  Location: [place];  Service: Orthopedics;  Laterality: Right;   AMPUTATION Left 06/28/2019   Procedure: AMPUTATION LEFT FIFTH TOE;  Surgeon: Rosetta Posner, MD;  Location: [place];  Service: Vascular;  Laterality: Left;   AMPUTATION TOE Right 04/28/2017   Procedure: AMPUTATION OF RIGHT SECOND RAY;  Surgeon: Newt Minion, MD;  Location: [place];  Service: Orthopedics;  Laterality: Right;   CARDIAC CATHETERIZATION     CARDIAC CATHETERIZATION N/A 01/12/2016   Procedure: Temporary Wire;  Surgeon: Minus Breeding, MD;  Location: [place] CV LAB;  Service: Cardiovascular;  Laterality: N/A;   CARDIOVERSION  ~ 02/2013   "twice"    CAROTID ANGIOGRAM N/A 01/15/2015   Procedure: CAROTID ANGIOGRAM;  Surgeon: [name], MD;  Location: [facility] CATH LAB;  Service: Cardiovascular;  Laterality: N/A;   DILATION AND CURETTAGE OF UTERUS  1988   ENDARTERECTOMY Left 02/19/2015   Procedure: LEFT CAROTID ENDARTERECTOMY ;  Surgeon: Serafina Mitchell, MD;  Location: [place];  Service: Vascular;  Laterality: Left;   FEMORAL-TIBIAL BYPASS GRAFT Left 06/28/2019   Procedure: BYPASS GRAFT LEFT LEG FEMORAL TO ANTERIOR TIBIAL ARTERY using LEFT GREATER SAPHENOUS VEIN;  Surgeon: Rosetta Posner, MD;  Location: [place];  Service: Vascular;  Laterality: Left;   LEFT HEART CATHETERIZATION WITH CORONARY ANGIOGRAM N/A 10/31/2014   Procedure: LEFT HEART CATHETERIZATION WITH CORONARY ANGIOGRAM;  Surgeon: Burnell Blanks, MD;  Location: [facility] CATH LAB;  Service: Cardiovascular;  Laterality: N/A;   LOWER EXTREMITY ANGIOGRAM N/A 09/10/2013   Procedure: LOWER EXTREMITY ANGIOGRAM;  Surgeon: [name], MD;  Location: [facility] CATH LAB;  Service: Cardiovascular;  Laterality: N/A;   LOWER EXTREMITY ANGIOGRAM N/A 01/15/2015   Procedure: LOWER EXTREMITY ANGIOGRAM;  Surgeon: [name], MD;  Location: [facility] CATH LAB;  Service: Cardiovascular;  Laterality: N/A;   LOWER EXTREMITY ANGIOGRAPHY N/A  04/13/2017   Procedure: Lower Extremity Angiography;  Surgeon: [name], MD;  Location: [place] CV LAB;  Service: Cardiovascular;  Laterality: N/A;   LOWER EXTREMITY ANGIOGRAPHY Left 06/26/2019   Procedure: LOWER EXTREMITY ANGIOGRAPHY;  Surgeon: Wellington Hampshire, MD;  Location: [place] CV LAB;  Service: Cardiovascular;  Laterality: Left;   PERIPHERAL VASCULAR ATHERECTOMY Right 06/07/2021   Procedure: PERIPHERAL VASCULAR ATHERECTOMY;  Surgeon: [name], MD;  Location: [place] CV LAB;  Service: Cardiovascular;  Laterality: Right;   PERIPHERAL VASCULAR BALLOON ANGIOPLASTY Left 06/26/2019   Procedure: PERIPHERAL VASCULAR BALLOON  ANGIOPLASTY;  Surgeon: Wellington Hampshire, MD;  Location: [place] CV LAB;  Service: Cardiovascular;  Laterality: Left;  unable to cross lt sfa occlusion   PERIPHERAL VASCULAR INTERVENTION  04/13/2017   Procedure: Peripheral Vascular Intervention;  Surgeon: [name], MD;  Location: [place] CV LAB;  Service: Cardiovascular;;   PRESSURE SENSOR/CARDIOMEMS N/A 02/05/2020   Procedure: PRESSURE SENSOR/CARDIOMEMS;  Surgeon: Larey Dresser, MD;  Location: [place] CV LAB;  Service: Cardiovascular;  Laterality: N/A;   VEIN HARVEST Left 06/28/2019   Procedure: LEFT LEG GREATER SAPHENOUS VEIN HARVEST;  Surgeon: Rosetta Posner, MD;  Location: [facility] OR;  Service: Vascular;  Laterality: Left;     OB History     Gravida  1   Para  1   Term  1   Preterm  0   AB  0   Living         SAB  0   IAB  0   Ectopic  0   Multiple      Live Births              Family History  Problem Relation Age of Onset   Hypertension Mother    Diabetes Mother    Cancer Mother        breast, ovarian, colon   Clotting disorder Mother    Heart disease Mother    Heart attack Mother    Breast cancer Mother        in 79's   Hypertension Father    Heart disease Father    Emphysema Sister        smoked    Social History   Tobacco Use    Smoking status: Every Day    Packs/day: 1.00    Years: 44.00    Pack years: 44.00    Types: E-cigarettes, Cigarettes   Smokeless tobacco: Former    Types: Snuff  Vaping Use   Vaping Use: Former   Devices: 11/26/2018 "stopped months ago"  Substance Use Topics   Alcohol use: Yes    Comment: occ   Drug use: Yes    Types: "Crack" cocaine, Marijuana, Oxycodone    Home Medications Prior to Admission medications   Medication Sig Start Date End Date Taking? Authorizing Provider  acetaminophen (TYLENOL) 500 MG tablet Take 500 mg by mouth every 6 (six) hours as needed for moderate pain.   Yes [provider]  albuterol (VENTOLIN HFA) 108 (90 Base) MCG/ACT inhaler Inhale 2 puffs into the lungs every 6 (six) hours as needed for wheezing or shortness of breath. 07/08/19  Yes Oretha Milch D, MD  amiodarone (PACERONE) 200 MG tablet Take 0.5 tablets (100 mg total) by mouth daily. 10/15/20  Yes Larey Dresser, MD  amLODipine (NORVASC) 10 MG tablet Take 1 tablet (10 mg total) by mouth daily. 10/15/20  Yes Larey Dresser, MD  apixaban (ELIQUIS) 5 MG TABS tablet Take 5 mg by mouth 2 (two) times daily.   Yes [provider]  atorvastatin (LIPITOR) 40 MG tablet Take 40 mg by mouth daily.   Yes [provider]  bisoprolol (ZEBETA) 5 MG tablet Take 1 tablet (5 mg total) by mouth daily. 10/15/20  Yes Larey Dresser, MD  budesonide-formoterol [facility]) 160-4.5 MCG/ACT inhaler Inhale 2 puffs into the lungs 2 (two) times daily. 12/09/19  Yes Eugenie Filler, MD  clopidogrel (PLAVIX) 75 MG tablet Take 1 tablet (75 mg total) by mouth daily with breakfast. 06/09/21  Yes  Reino Bellis B, NP  colchicine 0.6 MG tablet Take 0.6 mg by mouth daily.   Yes [provider]  Dulaglutide (TRULICITY) A999333 [id number] SOPN Inject 0.75 mg into the skin every Friday.   Yes [provider]  empagliflozin (JARDIANCE) 10 MG TABS tablet Take 1 tablet (10 mg total) by mouth daily  before breakfast. 04/01/21  Yes Clegg, [name] D, NP  FLUoxetine (PROZAC) 10 MG capsule Take 1 capsule (10 mg total) by mouth at bedtime. 05/05/21  Yes Kathie Dike, MD  folic acid (FOLVITE) 1 MG tablet Take 1 mg by mouth daily.   Yes [provider]  hydrALAZINE (APRESOLINE) 25 MG tablet Take 1 tablet (25 mg total) by mouth 3 (three) times daily. 03/08/21  Yes Larey Dresser, MD  insulin glargine (LANTUS) 100 UNIT/ML Solostar Pen INJECT 20 UNITS INTO THE SKIN DAILY. 01/05/21 01/05/22 Yes Allie Bossier, MD  insulin lispro (HUMALOG) 100 UNIT/ML KwikPen Inject 8 Units into the skin 3 (three) times daily with meals. 01/05/21  Yes Allie Bossier, MD  levothyroxine (SYNTHROID, LEVOTHROID) 50 MCG tablet Take 1 tablet (50 mcg total) by mouth daily. 08/09/17  Yes Medina-Vargas, Monina C, NP  losartan (COZAAR) 100 MG tablet Take 100 mg by mouth daily.   Yes [provider]  nortriptyline (PAMELOR) 50 MG capsule Take 50 mg by mouth 2 (two) times daily.   Yes [provider]  Omega-3 Fatty Acids (FISH OIL PO) Take 1 capsule by mouth daily.   Yes [provider]  pantoprazole (PROTONIX) 40 MG tablet Take 1 tablet (40 mg total) by mouth daily. 02/21/20  Yes Larey Dresser, MD  torsemide (DEMADEX) 20 MG tablet Take 4 tablets (80 mg total) by mouth every morning AND 3 tablets (60 mg total) every evening. 03/08/21  Yes Larey Dresser, MD  VITAMIN D PO Take 1 tablet by mouth daily.   Yes [provider]  ACCU-CHEK GUIDE test strip USE TO [place] DAILY 02/25/20   [provider]  insulin aspart (NOVOLOG) 100 UNIT/ML FlexPen INJECT 8 UNITS INTO THE SKIN THREE TIMES DAILY WITH MEALS. Patient not taking: Reported on 06/13/2021 01/05/21 01/05/22  Allie Bossier, MD  Insulin Pen Needle 32G X 4 MM MISC USE AS DIRECTED WITH INSULIN PENS 01/05/21 01/05/22  Allie Bossier, MD  spironolactone (ALDACTONE) 25 MG tablet Take 1 tablet (25 mg total) by mouth  daily. 05/27/21   Larey Dresser, MD  tiotropium (SPIRIVA HANDIHALER) 18 MCG inhalation capsule Place 1 capsule (18 mcg total) into inhaler and inhale every morning. Patient not taking: Reported on 02/09/2021 12/09/19 02/09/21  Eugenie Filler, MD    Allergies    Gabapentin and Lyrica [pregabalin]  Review of Systems   Review of Systems  Constitutional:  Negative for chills and fever.  HENT:  Negative for ear pain and sore throat.   Eyes:  Negative for pain and visual disturbance.  Respiratory:  Positive for shortness of breath. Negative for cough.   Cardiovascular:  Negative for chest pain and palpitations.  Gastrointestinal:  Negative for abdominal pain and vomiting.  Genitourinary:  Negative for dysuria and hematuria.  Musculoskeletal:  Negative for arthralgias and back pain.  Skin:  Negative for color change and rash.  Neurological:  Positive for light-headedness. Negative for syncope.  All other systems reviewed and are negative.  Physical Exam Updated Vital Signs BP (!) 147/58   Pulse (!) 41   Temp 98.5 F (36.9  C) (Oral)   Resp (!) 21   LMP  (LMP Unknown)   SpO2 96%   Physical Exam Constitutional:      General: She is not in acute distress. HENT:     Head: Normocephalic and atraumatic.  Eyes:     Conjunctiva/sclera: Conjunctivae normal.     Pupils: Pupils are equal, round, and reactive to light.  Cardiovascular:     Rate and Rhythm: Regular rhythm. Bradycardia present.     Pulses: Normal pulses.  Pulmonary:     Effort: Pulmonary effort is normal. No respiratory distress.  Abdominal:     General: There is no distension.     Tenderness: There is no abdominal tenderness.  Musculoskeletal:     Comments: LE edema, clean bandaging right extremity  Skin:    General: Skin is warm and dry.  Neurological:     General: No focal deficit present.     Mental Status: She is alert. Mental status is at baseline.  Psychiatric:        Mood and Affect: Mood normal.         Behavior: Behavior normal.    ED Results / Procedures / Treatments   Labs (all labs ordered are listed, but only abnormal results are displayed) Labs Reviewed  BASIC METABOLIC PANEL - Abnormal; Notable for the following components:      Result Value   Sodium 131 (*)    Glucose, Bld 519 (*)    BUN 57 (*)    Creatinine, Ser 2.50 (*)    Calcium 8.0 (*)    GFR, Estimated 22 (*)    All other components within normal limits  CBC - Abnormal; Notable for the following components:   WBC 11.1 (*)    RBC 3.50 (*)    Hemoglobin 9.6 (*)    HCT 31.5 (*)    RDW 16.2 (*)    All other components within normal limits  BASIC METABOLIC PANEL - Abnormal; Notable for the following components:   Glucose, Bld 194 (*)    BUN 58 (*)    Creatinine, Ser 2.40 (*)    Calcium 8.1 (*)    GFR, Estimated 23 (*)    All other components within normal limits  CBG MONITORING, ED - Abnormal; Notable for the following components:   Glucose-Capillary 486 (*)    All other components within normal limits  CBG MONITORING, ED - Abnormal; Notable for the following components:   Glucose-Capillary 197 (*)    All other components within normal limits  CBG MONITORING, ED - Abnormal; Notable for the following components:   Glucose-Capillary 204 (*)    All other components within normal limits  RESP PANEL BY RT-PCR (FLU A&B, COVID) ARPGX2  URINALYSIS, ROUTINE W REFLEX MICROSCOPIC  TSH  BASIC METABOLIC PANEL  TROPONIN I (HIGH SENSITIVITY)  TROPONIN I (HIGH SENSITIVITY)    EKG EKG Interpretation  Date/Time:  Sunday June 13 2021 17:12:51 EDT Ventricular Rate:  45 PR Interval:  172 QRS Duration: 104 QT Interval:  548 QTC Calculation: 475 R Axis:   139 Text Interpretation: Right and left arm electrode reversal, interpretation assumes no reversal Sinus or ectopic atrial bradycardia Right axis deviation Confirmed by Octaviano Glow [phone]) on 06/13/2021 5:17:29 PM  Radiology DG CHEST PORT 1 VIEW  Result Date:  06/13/2021 CLINICAL DATA:  Bradycardia.  Weakness. EXAM: PORTABLE CHEST 1 VIEW COMPARISON:  Radiograph 05/03/2021 FINDINGS: Mild cardiomegaly. CardioMEMS device again seen. Developing vascular congestion. No focal airspace disease. No pleural effusion  or pneumothorax. No acute osseous abnormalities are seen. IMPRESSION: Mild cardiomegaly. Developing vascular congestion. Electronically Signed   By: Keith Rake M.D.   On: 06/13/2021 22:05    Procedures Procedures   Medications Ordered in ED Medications  amiodarone (PACERONE) tablet 100 mg (has no administration in time range)  atorvastatin (LIPITOR) tablet 40 mg (has no administration in time range)  losartan (COZAAR) tablet 100 mg (has no administration in time range)  spironolactone (ALDACTONE) tablet 25 mg (has no administration in time range)  FLUoxetine (PROZAC) capsule 10 mg (has no administration in time range)  empagliflozin (JARDIANCE) tablet 10 mg (has no administration in time range)  insulin glargine (LANTUS) Solostar Pen 20 Units (has no administration in time range)  levothyroxine (SYNTHROID) tablet 50 mcg (has no administration in time range)  apixaban (ELIQUIS) tablet 5 mg (has no administration in time range)  clopidogrel (PLAVIX) tablet 75 mg (has no administration in time range)  mometasone-formoterol (DULERA) 200-5 MCG/ACT inhaler 2 puff (has no administration in time range)  albuterol (VENTOLIN HFA) 108 (90 Base) MCG/ACT inhaler 2 puff (has no administration in time range)  insulin aspart (novoLOG) injection 0-20 Units (has no administration in time range)  insulin aspart (novoLOG) injection 0-5 Units (has no administration in time range)  lactated ringers infusion (has no administration in time range)  acetaminophen (TYLENOL) tablet 650 mg (has no administration in time range)    Or  acetaminophen (TYLENOL) suppository 650 mg (has no administration in time range)  nicotine (NICODERM CQ - dosed in mg/24 hr) patch 7 mg  (has no administration in time range)  amLODipine (NORVASC) tablet 10 mg (has no administration in time range)  hydrALAZINE (APRESOLINE) tablet 25 mg (has no administration in time range)  nortriptyline (PAMELOR) capsule 50 mg (has no administration in time range)  pantoprazole (PROTONIX) EC tablet 40 mg (has no administration in time range)  folic acid (FOLVITE) tablet 1 mg (has no administration in time range)  sodium chloride 0.9 % bolus 1,000 mL (1,000 mLs Intravenous New Bag/Given 06/13/21 1724)  insulin aspart (novoLOG) injection 15 Units (15 Units Intravenous Given 06/13/21 1730)    ED Course  I have reviewed the triage vital signs and the nursing notes.  Pertinent labs & imaging results that were available during my care of the patient were reviewed by me and considered in my medical decision making (see chart for details).  Near syncope episode today Ddx includes drug intoxication (including marijuana use) vs symptomatic bradycardia vs anemia vs electrolyte/glucose derangement vs other  Unfortunately in the 4 days since she has been discharged the patient's Cr increased 1.6 to 2.5, BS uncontrolled >500.  500 cc IVF and insulin ordered here.  Does not appear to be DKA, no anion gap.  Hgb stable at 9.6.  HX of CHF - no evidence of acute exacerbation at this time. ECG shows sinus bradycardia, no acute ischemic findings, no CP.  Cardiology consulted, see below.   Clinical Course as of 06/13/21 2250  Sun Jun 13, 2021  2056 Patient is admitted to the hospitalist.  I spoke to the cardiologist who will come evaluate her.  He did find that her medical records a history of symptomatic bradycardia requiring pacing.  This heart rate in the low 40s is concerning, and I think he would benefit from observation in the hospital.  She also has near doubling of her creatinine in the past 5 days since her discharge.  She was given a small bolus of  fluids.  Her glucose is improved with insulin and  fluids.  Pacer pads will be placed.  She will be admitted to telemetry for monitoring and cardiology consultation [MT]    Clinical Course User Index [MT] Benetta Maclaren, Carola Rhine, MD    Final Clinical Impression(s) / ED Diagnoses Final diagnoses:  Bradycardia  Near syncope  Hyperglycemia  Elevated serum creatinine    Rx / DC Orders ED Discharge Orders     None        Wyvonnia Dusky, MD 06/13/21 2250

## 2021-06-13 NOTE — Consult Note (Signed)
Cardiology Consultation:   Patient ID: [name] MRN: [id number]; DOB: Sep 01, 1961  Admit date: 06/13/2021 Date of Consult: 06/14/2021  Primary Care Provider: [place] [place] Cardiologist: Quay Burow, MD  [facility] Electrophysiologist:  [name], MD  Patient Profile:   [name] is a 60 y.o. female  with PAD (R SFA atherectomy, 03/217), R TMA (04/2017), L fem-pop bypass+L 5th toe amputation (2020), carotid stenosis s/p L CEA (2016), CAD, HFrEF, pAF, tobacco use (100 py), prior substance use admitted in 04/2021 for HF exacerbation. She was admitted last week for revascularization of her R SFA CTO (2016, 2018, 06/07/21). ED consulted cardiology for bradycardia.   History of Present Illness:   [name] reports that she was in her house and smokes marijuana to help with her chronic pain when she subsequently went to the restroom and after using the restroom stood up and felt that both her legs were very wobbly.  She denied any presyncope or syncopal episode but was concerned that she was going to fall.  Her sister helped her sit down and following sitting down her symptoms improved.  She was tremulous during the episode but denied any chest pain, pressure, palpitations, or dizziness. On EMS arrival she was bradycardic and hypoglycemic so she was brought to the ED for further evaluation.  She was discharged on 06/08/2021 for repeat angioplasty of her R SFA CTO for chronic limb ischemia and has otherwise been doing well since discharge.  Follow evaluation in the ED she was found to have asymptomatic bradycardia in the 40s.  Although all of her symptoms had resolved she has a past history of significant symptomatic bradycardia requiring temporary TVP support in the setting of amiodarone/bisoprolol/diltiazem for pAF.  Additionally she had a fairly significant AKI and hyperglycemia with a mild leukocytosis but no anion gap so she will be observed with  repeat labs following correction of her hyperglycemia.  Past Medical History:  Diagnosis Date   Alcohol abuse    Alcoholic cirrhosis ([place])    Anemia    Anxiety    Arthritis    "knees" (11/26/2018)   B12 deficiency    CAD (coronary artery disease)    a. 11/10/2014 Cath: LM nl, LAD min irregs, D1 30 ost, D2 50d, LCX 36m OM1 80 p/m (1.5 mm vessel), OM2 463mRCA nondom 9076mmed rx.. Demand ischemia in the setting of rapid a-fib.   Cardiomyopathy ([place]  Carotid artery disease ([place]  a. 01/2015 Carotid Angio: RICA [id number] L CEA; c. 05/2019 Carotid U/S: RICA 100. RECA >50. LICA [id number] Cellulitis    lower extremities   CHF (congestive heart failure) ([name])    Chronic combined systolic and diastolic CHF (congestive heart failure) ([place]  a. 10/2014 Echo: EF 40-45%; b. 10/2018 Echo: EF 45-50%, gr2 DD; c. 11/2019 Echo: EF 50%, mild LVH, gr2 DD (restrictive), antlat HK, Nl RV fxn. Mild BAE. RVSP 59.9mm35m   CKD (chronic kidney disease), stage III ([name])    Cocaine abuse ([name])    COPD (chronic obstructive pulmonary disease) ([name])    Critical lower limb ischemia ([name])    Depression    Diabetes mellitus without complication ([name])    Diabetic peripheral neuropathy ([name])    DVT (deep venous thrombosis) ([name])    Dyspnea    Elevated troponin    a. Chronic elevation.   GERD (gastroesophageal reflux disease)    Hyperlipemia  Hypertension    Hypokalemia    Hypomagnesemia    Hypothyroidism    Marijuana abuse    Narcotic abuse ([name])    Noncompliance    NSVT (nonsustained ventricular tachycardia) ([name])    Obesity    PAF (paroxysmal atrial fibrillation) ([name])    Paroxysmal atrial tachycardia ([name])    Peripheral arterial disease ([place])    a. 01/2015 Angio/PTA: RSFA 99 (atherectomy/pta) - 1 vessel runoff via diff dzs peroneal; b. 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   Pneumonia    "once or twice" (11/26/2018)   Poorly controlled type 2 diabetes  mellitus ([place])    Renal disorder    Renal insufficiency    a. 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   Sleep apnea    "couldn't handle wearing the mask" (11/26/2018)   Symptomatic bradycardia    a. Avoid AV blocking agent per EP. Prev req temp wire in 2017.   Tobacco abuse    Past Surgical History:  Procedure Laterality Date   ABDOMINAL AORTOGRAM N/A 06/26/2019   Procedure: ABDOMINAL AORTOGRAM;  Surgeon: Wellington Hampshire, MD;  Location: [place] [facility];  Service: Cardiovascular;  Laterality: N/A;   ABDOMINAL AORTOGRAM W/LOWER EXTREMITY N/A 06/07/2021   Procedure: ABDOMINAL AORTOGRAM W/LOWER EXTREMITY;  Surgeon: [name], MD;  Location: [place] [facility];  Service: Cardiovascular;  Laterality: N/A;   AMPUTATION Right 06/14/2017   Procedure: Right foot transmetatarsal amputation;  Surgeon: Newt Minion, MD;  Location: [place];  Service: Orthopedics;  Laterality: Right;   AMPUTATION Left 06/28/2019   Procedure: AMPUTATION LEFT FIFTH TOE;  Surgeon: Rosetta Posner, MD;  Location: [place];  Service: Vascular;  Laterality: Left;   AMPUTATION TOE Right 04/28/2017   Procedure: AMPUTATION OF RIGHT SECOND RAY;  Surgeon: Newt Minion, MD;  Location: [place];  Service: Orthopedics;  Laterality: Right;   CARDIAC CATHETERIZATION     CARDIAC CATHETERIZATION N/A 01/12/2016   Procedure: Temporary Wire;  Surgeon: Minus Breeding, MD;  Location: [place] [facility];  Service: Cardiovascular;  Laterality: N/A;   CARDIOVERSION  ~ 02/2013   "twice"    CAROTID ANGIOGRAM N/A 01/15/2015   Procedure: CAROTID ANGIOGRAM;  Surgeon: [name], MD;  Location: [facility];  Service: Cardiovascular;  Laterality: N/A;   DILATION AND CURETTAGE OF UTERUS  1988   ENDARTERECTOMY Left 02/19/2015   Procedure: LEFT CAROTID ENDARTERECTOMY ;  Surgeon: Serafina Mitchell, MD;  Location: [place];  Service: Vascular;  Laterality: Left;   FEMORAL-TIBIAL BYPASS GRAFT Left 06/28/2019   Procedure: BYPASS GRAFT LEFT LEG FEMORAL TO ANTERIOR TIBIAL ARTERY  using LEFT GREATER SAPHENOUS VEIN;  Surgeon: Rosetta Posner, MD;  Location: [place];  Service: Vascular;  Laterality: Left;   LEFT HEART CATHETERIZATION WITH CORONARY ANGIOGRAM N/A 10/31/2014   Procedure: LEFT HEART CATHETERIZATION WITH CORONARY ANGIOGRAM;  Surgeon: Burnell Blanks, MD;  Location: [facility] CATH LAB;  Service: Cardiovascular;  Laterality: N/A;   LOWER EXTREMITY ANGIOGRAM N/A 09/10/2013   Procedure: LOWER EXTREMITY ANGIOGRAM;  Surgeon: [name], MD;  Location: [facility] CATH LAB;  Service: Cardiovascular;  Laterality: N/A;   LOWER EXTREMITY ANGIOGRAM N/A 01/15/2015   Procedure: LOWER EXTREMITY ANGIOGRAM;  Surgeon: [name], MD;  Location: [facility] CATH LAB;  Service: Cardiovascular;  Laterality: N/A;   LOWER EXTREMITY ANGIOGRAPHY N/A 04/13/2017   Procedure: Lower Extremity Angiography;  Surgeon: [name], MD;  Location: [place] [facility];  Service: Cardiovascular;  Laterality: N/A;   LOWER EXTREMITY ANGIOGRAPHY Left 06/26/2019   Procedure: LOWER EXTREMITY ANGIOGRAPHY;  Surgeon: Wellington Hampshire, MD;  Location: [place] [facility];  Service: Cardiovascular;  Laterality: Left;   PERIPHERAL VASCULAR ATHERECTOMY Right 06/07/2021   Procedure: PERIPHERAL VASCULAR ATHERECTOMY;  Surgeon: [name], MD;  Location: [place] [facility];  Service: Cardiovascular;  Laterality: Right;   PERIPHERAL VASCULAR BALLOON ANGIOPLASTY Left 06/26/2019   Procedure: PERIPHERAL VASCULAR BALLOON ANGIOPLASTY;  Surgeon: Wellington Hampshire, MD;  Location: [place] [facility];  Service: Cardiovascular;  Laterality: Left;  unable to cross lt sfa occlusion   PERIPHERAL VASCULAR INTERVENTION  04/13/2017   Procedure: Peripheral Vascular Intervention;  Surgeon: [name], MD;  Location: [place] [facility];  Service: Cardiovascular;;   PRESSURE SENSOR/CARDIOMEMS N/A 02/05/2020   Procedure: PRESSURE SENSOR/CARDIOMEMS;  Surgeon: Larey Dresser, MD;  Location: [place] [facility];  Service: Cardiovascular;   Laterality: N/A;   VEIN HARVEST Left 06/28/2019   Procedure: LEFT LEG GREATER SAPHENOUS VEIN HARVEST;  Surgeon: Rosetta Posner, MD;  Location: [facility];  Service: Vascular;  Laterality: Left;    Home Medications:  Prior to Admission medications   Medication Sig Start Date End Date Taking? Authorizing Provider  ACCU-CHEK GUIDE test strip USE TO CHECK BLOOD SUGAR FOUR TIMES DAILY 02/25/20   [provider]  acetaminophen (TYLENOL) 500 MG tablet Take 500 mg by mouth every 6 (six) hours as needed for moderate pain.    [provider]  albuterol (VENTOLIN HFA) 108 (90 Base) MCG/ACT inhaler Inhale 2 puffs into the lungs every 6 (six) hours as needed for wheezing or shortness of breath. 07/08/19   Oretha Milch D, MD  amiodarone (PACERONE) 200 MG tablet Take 0.5 tablets (100 mg total) by mouth daily. 10/15/20   Larey Dresser, MD  amLODipine (NORVASC) 10 MG tablet Take 1 tablet (10 mg total) by mouth daily. 10/15/20   Larey Dresser, MD  apixaban (ELIQUIS) 5 MG TABS tablet Take 5 mg by mouth 2 (two) times daily.    [provider]  atorvastatin (LIPITOR) 40 MG tablet Take 40 mg by mouth every evening.    [provider]  bisoprolol (ZEBETA) 5 MG tablet Take 1 tablet (5 mg total) by mouth daily. 10/15/20   Larey Dresser, MD  budesonide-formoterol [facility]) 160-4.5 MCG/ACT inhaler Inhale 2 puffs into the lungs 2 (two) times daily. 12/09/19   Eugenie Filler, MD  clopidogrel (PLAVIX) 75 MG tablet Take 1 tablet (75 mg total) by mouth daily with breakfast. 06/09/21   Reino Bellis B, NP  colchicine 0.6 MG tablet Take 0.6 mg by mouth daily.    [provider]  Dulaglutide (TRULICITY) [id number] SOPN Inject 0.75 mg into the skin every Friday.    [provider]  empagliflozin (JARDIANCE) 10 MG TABS tablet Take 1 tablet (10 mg total) by mouth daily before breakfast. 04/01/21   Clegg, [name] D, NP  FLUoxetine (PROZAC) 10 MG capsule Take 1 capsule (10 mg  total) by mouth at bedtime. 05/05/21   Kathie Dike, MD  folic acid (FOLVITE) 1 MG tablet Take 1 mg by mouth daily.    [provider]  hydrALAZINE (APRESOLINE) 25 MG tablet Take 1 tablet (25 mg total) by mouth 3 (three) times daily. 03/08/21   Larey Dresser, MD  insulin aspart (NOVOLOG) 100 UNIT/ML FlexPen INJECT 8 UNITS INTO THE SKIN THREE TIMES DAILY WITH MEALS. 01/05/21 01/05/22  Allie Bossier,  MD  insulin glargine (LANTUS) 100 UNIT/ML Solostar Pen INJECT 20 UNITS INTO THE SKIN DAILY. 01/05/21 01/05/22  Allie Bossier, MD  insulin lispro (HUMALOG) 100 UNIT/ML KwikPen Inject 8 Units into the skin 3 (three) times daily with meals. 01/05/21   Allie Bossier, MD  Insulin Pen Needle 32G X 4 MM MISC USE AS DIRECTED WITH INSULIN PENS 01/05/21 01/05/22  Allie Bossier, MD  levothyroxine (SYNTHROID, LEVOTHROID) 50 MCG tablet Take 1 tablet (50 mcg total) by mouth daily. 08/09/17   Medina-Vargas, Monina C, NP  nortriptyline (PAMELOR) 50 MG capsule Take 50 mg by mouth 2 (two) times daily.    [provider]  Omega-3 Fatty Acids (FISH OIL PO) Take 1 capsule by mouth daily.    [provider]  pantoprazole (PROTONIX) 40 MG tablet Take 1 tablet (40 mg total) by mouth daily. 02/21/20   Larey Dresser, MD  spironolactone (ALDACTONE) 25 MG tablet Take 1 tablet (25 mg total) by mouth daily. 05/27/21   Larey Dresser, MD  torsemide (DEMADEX) 20 MG tablet Take 4 tablets (80 mg total) by mouth every morning AND 3 tablets (60 mg total) every evening. 03/08/21   Larey Dresser, MD  VITAMIN D PO Take 1 tablet by mouth daily.    [provider]  tiotropium (SPIRIVA HANDIHALER) 18 MCG inhalation capsule Place 1 capsule (18 mcg total) into inhaler and inhale every morning. Patient not taking: Reported on 02/09/2021 12/09/19 02/09/21  Eugenie Filler, MD   Inpatient Medications: Scheduled Meds:  Continuous Infusions:  PRN Meds:  Allergies:    Allergies  Allergen Reactions    Gabapentin Nausea And Vomiting and Other (See Comments)    POSSIBLE SHAKING   Lyrica [Pregabalin] Other (See Comments)    Shaking    Social History:   Social History   Socioeconomic History   Marital status: Single    Spouse name: Not on file   Number of children: 1   Years of education: 12   Highest education level: 12th grade  Occupational History   Occupation: disabled  Tobacco Use   Smoking status: Every Day    Packs/day: 1.00    Years: 44.00    Pack years: 44.00    Types: E-cigarettes, Cigarettes   Smokeless tobacco: Former    Types: Snuff  Vaping Use   Vaping Use: Former   Devices: 11/26/2018 "stopped months ago"  Substance and Sexual Activity   Alcohol use: Yes    Comment: occ   Drug use: Yes    Types: "Crack" cocaine, Marijuana, Oxycodone   Sexual activity: Not Currently  Other Topics Concern   Not on file  Social History Narrative   ** Merged History Encounter **       Lives in [place], in motel with sister.  They are looking to move but don't have a place to go yet.     Social Determinants of Health   Financial Resource Strain: Low Risk    Difficulty of Paying Living Expenses: Not very hard  Food Insecurity: Not on file  Transportation Needs: Not on file  Physical Activity: Not on file  Stress: Not on file  Social Connections: Not on file  Intimate Partner Violence: Not on file    Family History:    Family History  Problem Relation Age of Onset   Hypertension Mother    Diabetes Mother    Cancer Mother        breast, ovarian, colon   Clotting  disorder Mother    Heart disease Mother    Heart attack Mother    Breast cancer Mother        in 24's   Hypertension Father    Heart disease Father    Emphysema Sister        smoked    ROS:  Review of Systems: [y] = yes, '[ ]'$  = no      General: Weight gain '[ ]'$ ; Weight loss '[ ]'$ ; Anorexia '[ ]'$ ; Fatigue '[ ]'$ ; Fever '[ ]'$ ; Chills '[ ]'$ ; Weakness '[ ]'$    Cardiac: Chest pain/pressure '[ ]'$ ; Resting SOB '[ ]'$ ; Exertional  SOB '[ ]'$ ; Orthopnea '[ ]'$ ; Pedal Edema '[ ]'$ ; Palpitations '[ ]'$ ; Syncope '[ ]'$ ; Presyncope '[ ]'$ ; Paroxysmal nocturnal dyspnea '[ ]'$    Pulmonary: Cough '[ ]'$ ; Wheezing '[ ]'$ ; Hemoptysis '[ ]'$ ; Sputum '[ ]'$ ; Snoring '[ ]'$    GI: Vomiting '[ ]'$ ; Dysphagia '[ ]'$ ; Melena '[ ]'$ ; Hematochezia '[ ]'$ ; Heartburn '[ ]'$ ; Abdominal pain '[ ]'$ ; Constipation '[ ]'$ ; Diarrhea '[ ]'$ ; BRBPR '[ ]'$    GU: Hematuria '[ ]'$ ; Dysuria '[ ]'$ ; Nocturia '[ ]'$  Vascular: Pain in legs with walking '[ ]'$ ; Pain in feet with lying flat '[ ]'$ ; Non-healing sores '[ ]'$ ; Stroke '[ ]'$ ; TIA '[ ]'$ ; Slurred speech '[ ]'$ ;   Neuro: Headaches '[ ]'$ ; Vertigo '[ ]'$ ; Seizures '[ ]'$ ; Paresthesias '[ ]'$ ;Blurred vision '[ ]'$ ; Diplopia '[ ]'$ ; Vision changes '[ ]'$  Ortho/Skin: Arthritis '[ ]'$ ; Joint pain '[ ]'$ ; Muscle pain '[ ]'$ ; Joint swelling '[ ]'$ ; Back Pain '[ ]'$ ; Rash '[ ]'$    Psych: Depression '[ ]'$ ; Anxiety '[ ]'$    Heme: Bleeding problems '[ ]'$ ; Clotting disorders '[ ]'$ ; Anemia '[ ]'$    Endocrine: Diabetes '[ ]'$ ; Thyroid dysfunction '[ ]'$    Physical Exam/Data:   Vitals:   06/13/21 1915 06/13/21 2200 06/13/21 2230 06/14/21 0056  BP: (!) 130/55 139/61 (!) 147/58 130/85  Pulse: (!) 43 (!) 43 (!) 41 (!) 56  Resp: (!) 25 (!) 24 (!) 21   Temp:    97.7 F (36.5 C)  TempSrc:    Oral  SpO2: 93% 93% 96% 93%  Weight:    104.2 kg  Height:    '5\' 4"'$  (1.626 m)   No intake or output data in the 24 hours ending 06/14/21 0210 Last 3 Weights 06/14/2021 06/10/2021 06/07/2021  Weight (lbs) 229 lb 11.5 oz 222 lb 222 lb  Weight (kg) 104.2 kg 100.699 kg 100.699 kg     Body mass index is 39.43 kg/m.  General:  Well nourished, well developed, in no acute distress HEENT: normal Lymph: no adenopathy Neck: no JVD Endocrine:  No thryomegaly Vascular: No carotid bruits; FA pulses 2+ bilaterally without bruits  Cardiac:  normal S1, S2; RRR; no murmur  Lungs:  clear to auscultation bilaterally, no wheezing, rhonchi or rales  Abd: soft, nontender, no hepatomegaly  Ext: no edema, RLE with partial amputation  Musculoskeletal:  No deformities, BUE and BLE  strength normal and equal Skin: warm and dry  Neuro:  CNs 2-12 intact, no focal abnormalities noted Psych:  Normal affect   EKG:  The EKG was personally reviewed and demonstrates: 06/13/21 (17:12): R/L lead reversal, sinus brady (HR 45)  Reviewed multiple prior ECG, usually NSR 55-65, occasionally HR 80-100, has had prior admission for sx bradycardia (junctional brady HR 34 on 12/2815) Telemetry:  Telemetry was personally reviewed and demonstrates: sinus bradycardia (HR 40s)   Relevant CV Studies:  Abd aortogram  Result date: 06/07/21 1.  Ultrasound-guided left common femoral access 2.  Abdominal aortogram/bilateral iliac angiogram/right lower extremity runoff 3.  PTA right SFA CTO, placement of spider distal protection device in right above-the-knee popliteal artery 4.  Hawk 1 directional atherectomy followed by drug-coated balloon angioplasty right SFA CTO, completion angiography  TTE Result date: 03/08/21  1. Left ventricular ejection fraction, by estimation, is 55 to 60%. The  left ventricle has normal function. The left ventricle demonstrates  regional wall motion abnormalities with basal to mid inferolateral  hypokinesis. There is mild left ventricular  hypertrophy. Left ventricular diastolic parameters are consistent with  Grade II diastolic dysfunction (pseudonormalization).   2. Right ventricular systolic function is normal. The right ventricular  size is normal. There is mildly elevated pulmonary artery systolic  pressure. The estimated right ventricular systolic pressure is [id number] mmHg.   3. The mitral valve is normal in structure. Mild mitral valve  regurgitation. No evidence of mitral stenosis.   4. The aortic valve is tricuspid. Aortic valve regurgitation is not  visualized. No aortic stenosis is present.   5. The inferior vena cava is dilated in size with >50% respiratory  variability, suggesting right atrial pressure of 8 mmHg.  Laboratory Data:  High Sensitivity  Troponin:   Recent Labs  Lab 06/13/21 1608 06/13/21 1911  TROPONINIHS 16 16     Chemistry Recent Labs  Lab 06/08/21 0228 06/13/21 1608 06/13/21 1911  NA 136 131* 137  K 3.8 4.6 4.2  CL 98 98 102  CO2 '28 22 25  '$ GLUCOSE 308* 519* 194*  BUN 28* 57* 58*  CREATININE 1.60* 2.50* 2.40*  CALCIUM 8.6* 8.0* 8.1*  GFRNONAA 37* 22* 23*  ANIONGAP '10 11 10    '$ No results for input(s): PROT, ALBUMIN, AST, ALT, ALKPHOS, BILITOT in the last 168 hours. Hematology Recent Labs  Lab 06/08/21 0228 06/13/21 1608  WBC 12.0* 11.1*  RBC 3.89 3.50*  HGB 10.5* 9.6*  HCT 35.0* 31.5*  MCV 90.0 90.0  MCH 27.0 27.4  MCHC 30.0 30.5  RDW 15.5 16.2*  PLT 272 263   BNPNo results for input(s): BNP, PROBNP in the last 168 hours.  DDimer No results for input(s): DDIMER in the last 168 hours.  Radiology/Studies:  DG CHEST PORT 1 VIEW  Result Date: 06/13/2021 CLINICAL DATA:  Bradycardia.  Weakness. EXAM: PORTABLE CHEST 1 VIEW COMPARISON:  Radiograph 05/03/2021 FINDINGS: Mild cardiomegaly. CardioMEMS device again seen. Developing vascular congestion. No focal airspace disease. No pleural effusion or pneumothorax. No acute osseous abnormalities are seen. IMPRESSION: Mild cardiomegaly. Developing vascular congestion. Electronically Signed   By: Keith Rake M.D.   On: 06/13/2021 22:05    Assessment and Plan:   Leg tremors Bradycardia  Ms. Heinkel presents with symptoms most consistent with either a vagal event following using the bathroom or orthostatic hypotension after standing up.  On EMS evaluation she was bradycardic to the 40s and hyperglycemic so was brought for further evaluation.  She was hospitalized in 01/2016 for what was thought to be symptomatic bradycardia with sudden onset of dizziness and lightheadedness along with increased shakiness.  This required temporary TVP support while her medications washed out including bisoprolol and diltiazem.  These were supposed to be permanently discontinued  however Ms. Mike has very low health literacy and does not keep up with her medications.  She apparently gets her medications organized via one of the EMS outreach teams but does not know the dosing herself.  It looks like she has  been off of bisoprolol for most of the year but was represcribed back in June although need to verify fill history with pharmacy.  We will have to work with EMS out reach to see what medications are actually in her prescription bottles.  Otherwise I would continue amiodarone and permanently discontinue any nodal blocking agents.  I do not think that this presentation is related to her bisoprolol however her heart rate usually is not in the 40s.  Fortunately right now she is completely asymptomatic from her mild bradycardia. - permanently d/c bisoprolol as above  - continue low dose amio  [facility] will sign off.   Medication Recommendations: d/c bisoprolol if still taking  Other recommendations (labs, testing, etc): none  Follow up as an outpatient: routine OP   For questions or updates, please contact [place] Please consult [email or web address] for contact info under   Signed, Dion Body, MD  06/14/2021 2:10 AM

## 2021-06-14 ENCOUNTER — Telehealth (HOSPITAL_COMMUNITY): Payer: Self-pay

## 2021-06-14 DIAGNOSIS — E1165 Type 2 diabetes mellitus with hyperglycemia: Secondary | ICD-10-CM

## 2021-06-14 DIAGNOSIS — I480 Paroxysmal atrial fibrillation: Secondary | ICD-10-CM | POA: Diagnosis not present

## 2021-06-14 DIAGNOSIS — R001 Bradycardia, unspecified: Secondary | ICD-10-CM

## 2021-06-14 DIAGNOSIS — J449 Chronic obstructive pulmonary disease, unspecified: Secondary | ICD-10-CM

## 2021-06-14 DIAGNOSIS — N179 Acute kidney failure, unspecified: Secondary | ICD-10-CM

## 2021-06-14 DIAGNOSIS — I5042 Chronic combined systolic (congestive) and diastolic (congestive) heart failure: Secondary | ICD-10-CM | POA: Diagnosis not present

## 2021-06-14 DIAGNOSIS — N189 Chronic kidney disease, unspecified: Secondary | ICD-10-CM

## 2021-06-14 DIAGNOSIS — I739 Peripheral vascular disease, unspecified: Secondary | ICD-10-CM

## 2021-06-14 DIAGNOSIS — F1210 Cannabis abuse, uncomplicated: Secondary | ICD-10-CM

## 2021-06-14 DIAGNOSIS — Z720 Tobacco use: Secondary | ICD-10-CM

## 2021-06-14 DIAGNOSIS — I10 Essential (primary) hypertension: Secondary | ICD-10-CM

## 2021-06-14 LAB — BASIC METABOLIC PANEL
Anion gap: 10 (ref 5–15)
BUN: 54 mg/dL — ABNORMAL HIGH (ref 6–20)
CO2: 25 mmol/L (ref 22–32)
Calcium: 8.2 mg/dL — ABNORMAL LOW (ref 8.9–10.3)
Chloride: 100 mmol/L (ref 98–111)
Creatinine, Ser: 2.07 mg/dL — ABNORMAL HIGH (ref 0.44–1.00)
GFR, Estimated: 27 mL/min — ABNORMAL LOW (ref >60)
Glucose, Bld: 238 mg/dL — ABNORMAL HIGH (ref 70–99)
Potassium: 4 mmol/L (ref 3.5–5.1)
Sodium: 135 mmol/L (ref 135–145)

## 2021-06-14 LAB — GLUCOSE, CAPILLARY
Glucose-Capillary: 241 mg/dL — ABNORMAL HIGH (ref 70–99)
Glucose-Capillary: 347 mg/dL — ABNORMAL HIGH (ref 70–99)

## 2021-06-14 LAB — MRSA NEXT GEN BY PCR, NASAL: MRSA by PCR Next Gen: NOT DETECTED

## 2021-06-14 LAB — TSH: TSH: 2.305 u[IU]/mL (ref 0.350–4.500)

## 2021-06-14 MED ORDER — ALBUTEROL SULFATE (2.5 MG/3ML) 0.083% IN NEBU
2.5000 mg | INHALATION_SOLUTION | Freq: Once | RESPIRATORY_TRACT | Status: AC
  Administered 2021-06-14: 2.5 mg via RESPIRATORY_TRACT
  Filled 2021-06-14: qty 3

## 2021-06-14 MED ORDER — CEPHALEXIN 500 MG PO CAPS: 500.0000 mg | ORAL_CAPSULE | Freq: Three times a day (TID) | ORAL | 0 refills | Status: AC

## 2021-06-14 NOTE — Progress Notes (Signed)
Orthopedic Tech Progress Note Patient Details:  [name] 02-11-1961 [id number]  Ortho Devices Type of Ortho Device: Haematologist Ortho Device/Splint Location: BLE Ortho Device/Splint Interventions: Ordered, Application, Adjustment   Post Interventions Patient Tolerated: Well Instructions Provided: Care of [place] 06/14/2021, 2:44 PM

## 2021-06-14 NOTE — Plan of Care (Signed)

## 2021-06-14 NOTE — Discharge Summary (Signed)
Physician Discharge Summary  [name] JSE:[id number] DOB: 1961/05/06 DOA: 06/13/2021  PCP: [facility]  Admit date: 06/13/2021 Discharge date: 06/14/2021  Admitted From: Home Disposition: Home   Recommendations for Outpatient Follow-up:  Follow up with PCP in 1-2 weeks for diabetes management Follow up with wound care clinic as scheduled 8/5 at 10:30am.  Started keflex for concern of nonpurulent cellulitis. Please obtain BMP/CBC in one week Follow up with cardiology, Dr. Gwenlyn Found 8/12.  Home Health: None new Equipment/Devices: None new Discharge Condition: Stable CODE STATUS: Full Diet recommendation: Carb-modified  Brief/Interim Summary: [name] is a 60 y.o. 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 She denies syncope/presyncope. EMS was alerted, reported bradycardia and hypoglycemia at the scene. In the ED, her heart rate consistently in 40's, though all symptoms had resolved. BP normotensive/hypertensive. Creatinine was 2.5 from baseline 1.6, glucose >500 without anion gap acidosis, WBC 11.9k, troponin 19 x2. Cardiology consulted, recommended stopping bisoprolol and no further work up.   Creatinine improved with IV fluids and the patient remains asymptomatic the following morning. Blood glucose improved. Oral antibiotics started for cellulitis of the left lower leg with plans for wound care follow up.   Discharge Diagnoses:  Principal Problem:   Bradycardia Active Problems:   Marijuana abuse   Tobacco abuse   Chronic combined systolic and  diastolic CHF (congestive heart failure) ([name])   Essential hypertension   COPD GOLD 0    PAD (peripheral artery disease) ([name])   AF (paroxysmal atrial fibrillation) ([name])   Morbid obesity ([place])   Uncontrolled type 2 diabetes mellitus with hyperglycemia ([name])   Acute kidney injury superimposed on CKD ([name])  Sinus bradycardia: Asymptomatic, normotensive. Troponin negative. TSH 2.305. - Ok to discharge per cardiology without further work up or treatment, will stop bisoprolol. Continue amiodarone.  IDT2DM uncontrolled with hyperglycemia: HbA1c 9.7% in June 2022 - Blood sugar improved with insulin administration. Will need close PCP follow up and reliable medication adherence. Average glucose is 257m/dl based on A1c.   AKI on stage IIIa CKD: Improved with fluids and correction of hyperglycemia. Now taking po adequately.  - Recheck at follow up.   PAD: Severe. Quiescent since last admission.  - Continue home medications  Left lower leg nonpurulent cellulitis:  - Keflex prescribed.  - Continue unna boots and wound care f/u.   Chronic combined systolic and diastolic CHF: Stable at this time. No change to home medications    HTN:  - Continue home medications with the exception of beta blocker.   COPD, tobacco and marijuana smoking: Pt had wheezing this morning but resolved with neb and reports no dyspnea, states this happens from time to time. Wheeze improved.  - Continue home medications - Smoking cessation counseling  Paroxysmal atrial fibrillation - Continue amio, eliquis.   Morbid obesity: Estimated body mass index is 39.43 kg/m as calculated from the following:   Height as of this encounter: '5\' 4"'  (1.626 m).   Weight as of this encounter: 104.2 kg.  Discharge Instructions Discharge Instructions     Diet - low sodium heart  healthy   Complete by: As directed    Diet Carb Modified   Complete by: As directed    Discharge instructions   Complete by: As directed    It is  important that you take insulin as directed every day and follow up with your primary doctor at [facility] in the next 1-2 weeks to discuss glucose control. Follow a low carbohydrate diet and check blood sugars regularly.   Your heart rate is slow but improved from when you arrived, and you have normal blood pressure. Cardiology has recommended STOPPING BISOPROLOL. You can otherwise continue medications as you were taking. If your symptoms return, seek medical attention right away.   It is recommended that you stop smoking all together.   Increase activity slowly   Complete by: As directed       Allergies as of 06/14/2021       Reactions   Gabapentin Nausea And Vomiting, Other (See Comments)   POSSIBLE SHAKING   Lyrica [pregabalin] Other (See Comments)   Shaking        Medication List     STOP taking these medications    bisoprolol 5 MG tablet Commonly known as: ZEBETA   NovoLOG FlexPen 100 UNIT/ML FlexPen Generic drug: insulin aspart       TAKE these medications    Accu-Chek Guide test strip Generic drug: glucose blood USE TO CHECK BLOOD SUGAR FOUR TIMES DAILY   acetaminophen 500 MG tablet Commonly known as: TYLENOL Take 500 mg by mouth every 6 (six) hours as needed for moderate pain.   albuterol 108 (90 Base) MCG/ACT inhaler Commonly known as: VENTOLIN HFA Inhale 2 puffs into the lungs every 6 (six) hours as needed for wheezing or shortness of breath.   amiodarone 200 MG tablet Commonly known as: PACERONE Take 0.5 tablets (100 mg total) by mouth daily.   amLODipine 10 MG tablet Commonly known as: NORVASC Take 1 tablet (10 mg total) by mouth daily.   atorvastatin 40 MG tablet Commonly known as: LIPITOR Take 40 mg by mouth daily.   budesonide-formoterol 160-4.5 MCG/ACT inhaler Commonly known as: Symbicort Inhale 2 puffs into the lungs 2 (two) times daily.   clopidogrel 75 MG tablet Commonly known as: PLAVIX Take 1 tablet (75 mg total) by mouth  daily with breakfast.   colchicine 0.6 MG tablet Take 0.6 mg by mouth daily.   Eliquis 5 MG Tabs tablet Generic drug: apixaban Take 5 mg by mouth 2 (two) times daily.   empagliflozin 10 MG Tabs tablet Commonly known as: Jardiance Take 1 tablet (10 mg total) by mouth daily before breakfast.   FISH OIL PO Take 1 capsule by mouth daily.   FLUoxetine 10 MG capsule Commonly known as: PROZAC Take 1 capsule (10 mg total) by mouth at bedtime.   folic acid 1 MG tablet Commonly known as: FOLVITE Take 1 mg by mouth daily.   hydrALAZINE 25 MG tablet Commonly known as: APRESOLINE Take 1 tablet (25 mg total) by mouth 3 (three) times daily.   insulin lispro 100 UNIT/ML KwikPen Commonly known as: HUMALOG Inject 8 Units into the skin 3 (three) times daily with meals.   Lantus SoloStar 100 UNIT/ML Solostar Pen Generic drug: insulin glargine INJECT 20 UNITS INTO THE SKIN DAILY.   levothyroxine 50 MCG tablet Commonly known as: SYNTHROID Take 1 tablet (50 mcg total) by mouth daily.   losartan 100 MG tablet Commonly known as: COZAAR Take 100 mg by mouth daily.   nortriptyline 50  MG capsule Commonly known as: PAMELOR Take 50 mg by mouth 2 (two) times daily.   pantoprazole 40 MG tablet Commonly known as: PROTONIX Take 1 tablet (40 mg total) by mouth daily.   PenTips 32G X 4 MM Misc Generic drug: Insulin Pen Needle USE AS DIRECTED WITH INSULIN PENS   spironolactone 25 MG tablet Commonly known as: ALDACTONE Take 1 tablet (25 mg total) by mouth daily.   torsemide 20 MG tablet Commonly known as: DEMADEX Take 4 tablets (80 mg total) by mouth every morning AND 3 tablets (60 mg total) every evening.   Trulicity 1.61 WR/6.0AV Sopn Generic drug: Dulaglutide Inject 0.75 mg into the skin every Friday.   VITAMIN D PO Take 1 tablet by mouth daily.        [name] Follow up.   Contact information: [place]  [address] [phone]         [name], MD .   Specialties: Cardiology, Radiology Contact information: [address] [place] [address] [phone]         [name], MD .   Specialties: Cardiology, Radiology Contact information: [address] [place] [address] [phone]                Allergies  Allergen Reactions   Gabapentin Nausea And Vomiting and Other (See Comments)    POSSIBLE SHAKING   Lyrica [Pregabalin] Other (See Comments)    Shaking     Consultations: Cardiology WOC  Procedures/Studies: PERIPHERAL VASCULAR CATHETERIZATION  Result Date: 06/07/2021 Formatting of this result is different from the original. Images from the original result were not included.  [id number] LOCATION:  FACILITY: [facility] PHYSICIAN: [facility], M.D. 11-25-1960 DATE OF PROCEDURE:  06/07/2021 DATE OF DISCHARGE: PV Angiogram/Intervention History obtained from chart review.BARRI NEIDLINGER is a 60y.o.  moderately overweight single Caucasian female mother of one child who lives with her sister. She was originally referred by Dr. Franki Monte at [facility] for peripheral vascular evaluation because of critical limb ischemia. I last saw her in the office  12/25/2018. She sees Dr. Jenkins Rouge at [facility] for cardiomyopathy and paroxysmal atrial fibrillation. Her cardiac risk factors include a 20-pack-year history of tobacco abuse (she has stopped smoking), treated diabetes, and hypertension as well as family history of heart disease. She's never had a heart attack or stroke. She does have COPD. She had paroxysmal atrial fibrillation in the past and has undergone cardioversion in [place].. The lower extremity arterial Doppler studies performed in our office suggested critical limb ischemia with an occluded SFA and popliteal arteries bilaterally with tibial vessel disease as well. She complains of lifestyle limiting claudication. I  performed lower extremity angiography on 09/10/13 revealing occluded SFAs bilaterally with chronic occluded tibial vessels as well. I performed TurboHawk  directional atherectomy of her entire right SFA with an excellent angiographic result removing a copious amount of atherosclerotic plaque. The ulcer on her right heel ultimately healed. She does continue to smoke one pack per day. She was recently admitted to [facility] because of atrial fibrillation with rapid ventricular response. She underwent cardiac catheterization on 10/31/14 revealing an ejection fraction of 30-35% with disease that was ultimately treated medically. Her carotid Doppler showed high-grade bilateral ICA disease and lower extremity Dopplers revealed restenosis within the right SFA with a ABI of 0.34. She is symptomatic on that side. I performed angiography on  her 01/15/15 revealing an occluded right internal carotid artery, 95% proximal left internal carotid artery stenosis, long 99% stenosis within the right SFA and an occluded left SFA. I performed [facility] 1  directional atherectomy and drug-eluting balloon angioplasty of her right SFA with an excellent  Angiographic and clinical result. She underwent left carotid endarterectomy by Dr. Trula Slade on 02/19/15 which was uncomplicated. Since I saw her 6 months ago she's been remaining clinically stable. She denies chest pain or shortness of breath. She continues to have claudication.Lower extremity Doppler studies performed 01/23/15 revealed ABIs in the 0.6 range bilaterally with a patent right SFA. She continues to complain of claudication however.recent Dopplers performed 12/22/15 revealed a right TBI 0. 17. She was admitted to the hospital with altered mental status and sepsis 04/06/17 for one week. 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 I suspect performed angiography of her 04/13/17 revealing an occluded right SFA was 0 vessel runoff. Atherectomy I's are entire right SFA and she  subsequently underwent right second ray resection by Dr. Sharol Given to on 04/28/17. She has stopped smoking since that hospitalization. She also underwent a right transmetatarsal amputation by Dr. Sharol Given which has subsequently healed. Recent lower extremity Dopplers performed 05/29/17 revealed a right ABI 0.65 with a patent right SFA and a left ABI 0.31.   She does have diastolic heart failure followed by Dr. Aundra Dubin in the heart failure clinic.  She has had a CardioMEMS device implanted to follow her pulmonary pressures and lung impedance..  She does admit to dietary indiscretion.  She was diuresed 30 pounds.  She is aware of salt restriction.  She is on oral diuretics and weighs herself on a daily basis.  She denies claudication but does have pain in her feet suggestive of diabetic peripheral neuropathy.  She was admitted to the hospital in August of last year with an infected diabetic left foot ulcer and ultimately underwent left fem-tib bypass grafting by Dr. Sherren Mocha early 06/28/2019 which he follows by duplex ultrasound on outpatient basis.  She unfortunately is gone back to smoking half a pack per day.  She denies chest pain or shortness of breath.  She is aware of salt restriction. She was seen in our office by Caron Presume, PA-C with a nonhealing wound on her right heel.  She presents now for angiography and potential endovascular therapy for limb salvage. Pre Procedure Diagnosis: Critical limb ischemia Post Procedure Diagnosis: Critical limb ischemia Operators: Dr. [facility] Bu[facility] Procedures Performed:  1.  Ultrasound-guided left common femoral access  2.  Abdominal aortogram/bilateral iliac angiogram/right lower extremity runoff  3.  PTA right SFA CTO, placement of spider distal protection device in right above-the-knee popliteal artery  4.  Hawk 1 directional atherectomy followed by drug-coated balloon angioplasty right SFA CTO, completion angiography PROCEDURE DESCRIPTION: The patient was brought to the second floor   Cardiac cath lab in the the postabsorptive state. She was premedicated with IV Versed and fentanyl. Her left groin was prepped and shaved in usual sterile fashion. Xylocaine 1% was used for local anesthesia. A 5 French sheath was inserted into the left common femoral artery using standard Seldinger technique.  A 5 French pigtail catheter was placed in the distal abdominal aorta.  Distal abdominal aortography and bilateral iliac angiography were performed.  Contralateral access was obtained with a crossover catheter, endhole catheter along with a Glidewire (second order catheter placement).  Right lower extremity angiography with runoff was performed using bolus chase, digital subtraction  and step table technique.  Omnipaque dye was used for the entirety of the case (total of 170 cc was administered patient).  Retrograde aortic pressures monitored during the case.  Angiographic Data: 1: Abdominal aorta-widely patent 2: Left lower extremity-the left common and extra iliac artery as well as common femoral artery were widely patent.  The left SFA was occluded.  The femoropopliteal bypass graft was visualized by hand-injection noted to be patent. 3: Right lower extremity-occluded right SFA in the midportion over a fairly long distance with reconstitution in the adductor canal by profunda femoris collaterals.  The popliteal and tibial vessels were occluded as well.  The peroneal vessel fills by collaterals and filled the PT and DP but by communicating collaterals.   Ms. Adam Phenix has a right SFA CTO with 0 vessel runoff and a nonhealing wound on her right heel.  She does have chronic renal sufficiency with a serum creatinine in the 1.7 range.  We will proceed with right SFA directional atherectomy followed by drug-coated balloon angioplasty for limb salvage Procedure Description: A 7 French 55 cm multipurpose Ansell sheath was then advanced over a versa core wire into the right common femoral artery.  The patient  received a total of 16,000's of heparin with an ending ACT of 277. I was able to cross the CTO with a Viance CTO catheter along with 0.14 6 g Zilient  wire.  I then performed PTA with a 2.5 mm x 150 mm long jade balloon.  I placed a 6 mm spider distal protection device in the right above-the-knee popliteal artery.  Following this I performed directional atherectomy with an LX device performing multiple circumferential cuts and retrieving a copious amount of atherosclerotic plaque.  Following this I performed drug-coated balloon angioplasty with a 5 mm x 200 mm long impact Admiral balloon at 4 atm for 3 minutes resulting in reduction of a total occlusion to 0% residual with excellent flow down to the popliteal artery.  The Ansell sheath was then withdrawn over the bifurcation over an 035 versa core wire exchanged for a short 7 [place] sheath which was then secured in place.  She received 300 mg of p.o. Plavix. Final Impression: Successful Hawk 1 directional atherectomy followed by drug-coated balloon angioplasty of a right SFA CTO in the setting of critical ischemia with 0 vessel runoff.  This is the third time I revascularized her right SFA CTO, first in 2016 and then in 2018.  She does continue to smoke.  She is on Eliquis for PAF.  She can be on Plavix and Eliquis, ask if aspirin can be discontinued.  She will be hydrated overnight and her renal function followed carefully given her renal insufficiency.  We will obtain lower extremity arterial Doppler studies in our [facility] line office next week and I will see her back 1 to 2 weeks thereafter.  She left the lab in stable condition. [facility] Burow. MD, [facility] 06/07/2021 12:29 PM    DG CHEST PORT 1 VIEW  Result Date: 06/13/2021 CLINICAL DATA:  Bradycardia.  Weakness. EXAM: PORTABLE CHEST 1 VIEW COMPARISON:  Radiograph 05/03/2021 FINDINGS: Mild cardiomegaly. CardioMEMS device again seen. Developing vascular congestion. No focal airspace disease. No pleural effusion or  pneumothorax. No acute osseous abnormalities are seen. IMPRESSION: Mild cardiomegaly. Developing vascular congestion. Electronically Signed   By: Keith Rake M.D.   On: 06/13/2021 22:05   VAS US CAROTID  Result Date: 06/03/2021 Carotid Arterial Duplex Study Patient Name:  LEXANDRA RETTKE  Date of  Exam:   06/02/2021 Medical Rec #: [id number]      Accession #:    [id number] Date of Birth: 1961-04-07     Patient Gender: F Patient Age:   71Y Exam Location:  [facility] Procedure:      VAS US CAROTID Referring Phys: [id number] JONATHAN J BERRY --------------------------------------------------------------------------------  Indications:       Left endarterectomy and h/o bilateral carotid artery stenosis                    with known RICA occlusion. Patient does have blurred vision                    due to diabetes and is currently under ophthalmology                    observation where she receives injections. She denies any                    other cerebrovascular symptoms. Risk Factors:      Hypertension, hyperlipidemia, Diabetes, current smoker,                    coronary artery disease, PAD. Other Factors:     COPD. Comparison Study:  In 05/2020, a carotid duplex velocities of 43/16 cm/s in                    LICA, s/p endarterectomy. Known RICA occlusion. Performing Technologist: Sharlett Iles RVT  Examination Guidelines: A complete evaluation includes B-mode imaging, spectral Doppler, color Doppler, and power Doppler as needed of all accessible portions of each vessel. Bilateral testing is considered an integral part of a complete examination. Limited examinations for reoccurring indications may be performed as noted.  Right Carotid Findings: +----------+-------+-------+--------+--------------------------------+---------+           PSV    EDV    StenosisPlaque Description              Comments            cm/s   cm/s                                                      +----------+-------+-------+--------+--------------------------------+---------+ CCA Prox  229    0      >50%    heterogenous                              +----------+-------+-------+--------+--------------------------------+---------+ CCA Distal32     7              heterogenous                              +----------+-------+-------+--------+--------------------------------+---------+ ICA Prox  0      0      Occludedheterogenous and diffuse                  +----------+-------+-------+--------+--------------------------------+---------+ ICA Mid   0      0      Occludedheterogenous and diffuse                  +----------+-------+-------+--------+--------------------------------+---------+ ICA Distal0      0      Occludedheterogenous  and diffuse                  +----------+-------+-------+--------+--------------------------------+---------+ ECA       505    53     >50%    heterogenous, irregular and     shadowing                                 diffuse                                   +----------+-------+-------+--------+--------------------------------+---------+ +----------+--------+-------+---------+-------------------+           PSV cm/sEDV cmsDescribe Arm Pressure (mmHG) +----------+--------+-------+---------+-------------------+ [id number]            Turbulent127                 +----------+--------+-------+---------+-------------------+ +---------+--------+--+--------+--+---------+ VertebralPSV cm/s68EDV cm/s21Antegrade +---------+--------+--+--------+--+---------+  Left Carotid Findings: +----------+--------+--------+--------+------------------+-------------------+           PSV cm/sEDV cm/sStenosisPlaque DescriptionComments            +----------+--------+--------+--------+------------------+-------------------+ CCA Prox  62      8                                 tortuous             +----------+--------+--------+--------+------------------+-------------------+ CCA Distal87      11                                intimal hyperplasia +----------+--------+--------+--------+------------------+-------------------+ ICA Prox  52      9       1-39%                     intimal hyperplasia +----------+--------+--------+--------+------------------+-------------------+ ICA Mid   40      14                                                    +----------+--------+--------+--------+------------------+-------------------+ ICA Distal46      18                                                    +----------+--------+--------+--------+------------------+-------------------+ ECA       101     9                                                     +----------+--------+--------+--------+------------------+-------------------+ +----------+--------+--------+----------------+-------------------+           PSV cm/sEDV cm/sDescribe        Arm Pressure (mmHG) +----------+--------+--------+----------------+-------------------+ [id number]             Multiphasic, WNL130                 +----------+--------+--------+----------------+-------------------+ +---------+--------+--+--------+--+---------+ VertebralPSV cm/s69EDV cm/s19Antegrade +---------+--------+--+--------+--+---------+   Summary: Right Carotid: Evidence consistent with a total occlusion  of the right ICA.                Hemodynamically significant plaque >50% visualized in the CCA.                The ECA appears >50% stenosed. Known chronic RICA occlusion. Left Carotid: Velocities in the left ICA are consistent with a 1-39% stenosis.               Stable LICA velocities, s/p endarterectomy. Vertebrals:  Bilateral vertebral arteries demonstrate antegrade flow. Subclavians: Right subclavian artery flow was disturbed. Normal flow              hemodynamics were seen in the left subclavian artery. *See table(s)  above for measurements and observations. Suggest follow up study in 12 months. Electronically signed by Larae Grooms MD on 06/03/2021 at 1:18:05 PM.    Final      Subjective: Feels well, ate all of breakfast. No dyspnea, palpitations, chest pain or other complaints. No further weakness in legs and no dizziness.   Discharge Exam: Vitals:   06/14/21 0318 06/14/21 0752  BP:  (!) 145/57  Pulse: (!) 41 (!) 46  Resp: 19 15  Temp:  97.9 F (36.6 C)  SpO2:  96%   General: Pt is alert, awake, not in acute distress Cardiovascular: RRR, S1/S2 +, no rubs, no gallops Respiratory: CTA bilaterally, no wheezing, no rhonchi Abdominal: Soft, NT, ND, bowel sounds + Extremities: Erythema without fluctuance to LLE with serous blisters and no purulence.  Labs: BNP (last 3 results) Recent Labs    09/10/20 1219 05/04/21 0053  BNP 178.7* 650.3*   Basic Metabolic Panel: Recent Labs  Lab 06/08/21 0228 06/13/21 1608 06/13/21 1911 06/14/21 0408  NA 136 131* 137 135  K 3.8 4.6 4.2 4.0  CL 98 98 102 100  CO2 '28 22 25 26  ' GLUCOSE 308* 519* 194* 238*  BUN 28* 57* 58* 54*  CREATININE 1.60* 2.50* 2.40* 2.07*  CALCIUM 8.6* 8.0* 8.1* 8.2*   Liver Function Tests: No results for input(s): AST, ALT, ALKPHOS, BILITOT, PROT, ALBUMIN in the last 168 hours. No results for input(s): LIPASE, AMYLASE in the last 168 hours. No results for input(s): AMMONIA in the last 168 hours. CBC: Recent Labs  Lab 06/08/21 0228 06/13/21 1608  WBC 12.0* 11.1*  HGB 10.5* 9.6*  HCT 35.0* 31.5*  MCV 90.0 90.0  PLT 272 263   Cardiac Enzymes: No results for input(s): CKTOTAL, CKMB, CKMBINDEX, TROPONINI in the last 168 hours. BNP: Invalid input(s): POCBNP CBG: Recent Labs  Lab 06/08/21 0739 06/13/21 1558 06/13/21 1901 06/13/21 2245 06/14/21 0626  GLUCAP 341* 486* 197* 204* 241*   D-Dimer No results for input(s): DDIMER in the last 72 hours. Hgb A1c No results for input(s): HGBA1C in the last 72  hours. Lipid Profile No results for input(s): CHOL, HDL, LDLCALC, TRIG, CHOLHDL, LDLDIRECT in the last 72 hours. Thyroid function studies Recent Labs    06/13/21 2233  TSH 2.305   Anemia work up No results for input(s): VITAMINB12, FOLATE, FERRITIN, TIBC, IRON, RETICCTPCT in the last 72 hours. Urinalysis    Component Value Date/Time   COLORURINE STRAW (A) 02/23/2021 0101   APPEARANCEUR CLEAR 02/23/2021 0101   LABSPEC 1.008 02/23/2021 0101   PHURINE 5.0 02/23/2021 0101   GLUCOSEU NEGATIVE 02/23/2021 0101   HGBUR NEGATIVE 02/23/2021 0101   BILIRUBINUR NEGATIVE 02/23/2021 0101   KETONESUR NEGATIVE 02/23/2021 0101   PROTEINUR NEGATIVE 02/23/2021 0101   UROBILINOGEN 0.2  09/05/2015 0012   NITRITE NEGATIVE 02/23/2021 0101   LEUKOCYTESUR NEGATIVE 02/23/2021 0101    Microbiology Recent Results (from the past 240 hour(s))  SARS CORONAVIRUS 2 (TAT 6-24 HRS) Nasopharyngeal Nasopharyngeal Swab     Status: None   Collection Time: 06/07/21  5:10 PM   Specimen: Nasopharyngeal Swab  Result Value Ref Range Status   SARS Coronavirus 2 NEGATIVE NEGATIVE Final    Comment: (NOTE) SARS-CoV-2 target nucleic acids are NOT DETECTED.  The SARS-CoV-2 RNA is generally detectable in upper and lower respiratory specimens during the acute phase of infection. Negative results do not preclude SARS-CoV-2 infection, do not rule out co-infections with other pathogens, and should not be used as the sole basis for treatment or other patient management decisions. Negative results must be combined with clinical observations, patient history, and epidemiological information. The expected result is Negative.  Fact Sheet for Patients: SugarRoll.be  Fact Sheet for Healthcare Providers: https://www.woods-mathews.com/  This test is not yet approved or cleared by the [place] FDA and  has been authorized for detection and/or diagnosis of SARS-CoV-2 by FDA under an  Emergency Use Authorization (EUA). This EUA will remain  in effect (meaning this test can be used) for the duration of the COVID-19 declaration under Se ction 564(b)(1) of the Act, 21 U.S.C. section 360bbb-3(b)(1), unless the authorization is terminated or revoked sooner.  Performed at [facility], [place] [address], [place], [place] [address]   Resp Panel by RT-PCR (Flu A&B, Covid) Nasopharyngeal Swab     Status: None   Collection Time: 06/13/21  8:40 PM   Specimen: Nasopharyngeal Swab; Nasopharyngeal(NP) swabs in vial transport medium  Result Value Ref Range Status   SARS Coronavirus 2 by RT PCR NEGATIVE NEGATIVE Final    Comment: (NOTE) SARS-CoV-2 target nucleic acids are NOT DETECTED.  The SARS-CoV-2 RNA is generally detectable in upper respiratory specimens during the acute phase of infection. The lowest concentration of SARS-CoV-2 viral copies this assay can detect is 138 copies/mL. A negative result does not preclude SARS-Cov-2 infection and should not be used as the sole basis for treatment or other patient management decisions. A negative result may occur with  improper specimen collection/handling, submission of specimen other than nasopharyngeal swab, presence of viral mutation(s) within the areas targeted by this assay, and inadequate number of viral copies(<138 copies/mL). A negative result must be combined with clinical observations, patient history, and epidemiological information. The expected result is Negative.  Fact Sheet for Patients:  EntrepreneurPulse.com.au  Fact Sheet for Healthcare Providers:  IncredibleEmployment.be  This test is no t yet approved or cleared by the [place] FDA and  has been authorized for detection and/or diagnosis of SARS-CoV-2 by FDA under an Emergency Use Authorization (EUA). This EUA will remain  in effect (meaning this test can be used) for the duration of the COVID-19 declaration  under Section 564(b)(1) of the Act, 21 U.S.C.section 360bbb-3(b)(1), unless the authorization is terminated  or revoked sooner.       Influenza A by PCR NEGATIVE NEGATIVE Final   Influenza B by PCR NEGATIVE NEGATIVE Final    Comment: (NOTE) The Xpert Xpress SARS-CoV-2/FLU/RSV plus assay is intended as an aid in the diagnosis of influenza from Nasopharyngeal swab specimens and should not be used as a sole basis for treatment. Nasal washings and aspirates are unacceptable for Xpert Xpress SARS-CoV-2/FLU/RSV testing.  Fact Sheet for Patients: EntrepreneurPulse.com.au  Fact Sheet for Healthcare Providers: IncredibleEmployment.be  This test is not yet approved or  cleared by the [place] and has been authorized for detection and/or diagnosis of SARS-CoV-2 by FDA under an Emergency Use Authorization (EUA). This EUA will remain in effect (meaning this test can be used) for the duration of the COVID-19 declaration under Section 564(b)(1) of the Act, 21 U.S.C. section 360bbb-3(b)(1), unless the authorization is terminated or revoked.  Performed at [facility], [place] [address] [place], [place] [address]   MRSA Next Gen by PCR, Nasal     Status: None   Collection Time: 06/14/21  1:02 AM   Specimen: Nasal Mucosa; Nasal Swab  Result Value Ref Range Status   MRSA by PCR Next Gen NOT DETECTED NOT DETECTED Final    Comment: (NOTE) The GeneXpert MRSA Assay (FDA approved for NASAL specimens only), is one component of a comprehensive MRSA colonization surveillance program. It is not intended to diagnose MRSA infection nor to guide or monitor treatment for MRSA infections. Test performance is not FDA approved in patients less than 68 years old. Performed at [facility], [place] [address], [place],  [address]     Time coordinating discharge: Approximately 40 minutes  Patrecia Pour, MD  [facility] 06/14/2021, 8:37 AM

## 2021-06-14 NOTE — Consult Note (Signed)
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  History of amputation of fifth toe.  Nonhealing wound to right plantar foot.  Cellulitis to left lower leg with blistering and erythema to left lower leg.  Last week, underwent aortogram with attempt at improved improved vascularization to lower legs.  Patient reports improved pain to lower legs. Has been wearing compression at home, will continue this.   Reason for Consult:Blistering to left lower leg. 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  Wound type:neuropathic ulcer with PAD contributory.  Recent aortogram.  Pressure Injury POA: Yes Measurement: 1 cm x 1.5 cm x 0.3 cm  Wound [id number] pink and moist  nongranulating Drainage (amount, consistency, odor) minimal serosanguinous  Periwound:calloused periwound.  Missed last appointment at wound care center where she frequently gets sharps debridement.  Dressing procedure/placement/frequency: Bedside RN to Cleanse right foot plantar wound with NS and pat dry.  Apply Aquacel Ag (LAWSON # [id number]) to wound bed. Ortho tech to wrap both lower extremities with [employer].  Secure with Myrle Sheng [facility]) Change twice weekly.  Will not follow at this time.  Please re-consult if needed.  Domenic Moras MSN, RN, FNP-BC CWON Wound, Ostomy, Continence Nurse Pager [phone]

## 2021-06-14 NOTE — Plan of Care (Signed)
Initiation of Care Plan Problem: Education: Goal: Knowledge of General Education information will improve Description: Including pain rating scale, medication(s)/side effects and non-pharmacologic comfort measures Outcome: Progressing   Problem: Health Behavior/Discharge Planning: Goal: Ability to manage health-related needs will improve Outcome: Progressing   Problem: Clinical Measurements: Goal: Ability to maintain clinical measurements within normal limits will improve Outcome: Progressing Goal: Will remain free from infection Outcome: Progressing Goal: Diagnostic test results will improve Outcome: Progressing Goal: Respiratory complications will improve Outcome: Progressing Goal: Cardiovascular complication will be avoided Outcome: Progressing   Problem: Activity: Goal: Risk for activity intolerance will decrease Outcome: Progressing   Problem: Nutrition: Goal: Adequate nutrition will be maintained Outcome: Progressing   Problem: Coping: Goal: Level of anxiety will decrease Outcome: Progressing   Problem: Elimination: Goal: Will not experience complications related to bowel motility Outcome: Progressing Goal: Will not experience complications related to urinary retention Outcome: Progressing   Problem: Pain Managment: Goal: General experience of comfort will improve Outcome: Progressing   Problem: Safety: Goal: Ability to remain free from injury will improve Outcome: Progressing   Problem: Skin Integrity: Goal: Risk for impaired skin integrity will decrease Outcome: Progressing   Problem: Education: Goal: Ability to describe self-care measures that may prevent or decrease complications (Diabetes Survival Skills Education) will improve Outcome: Progressing Goal: Individualized Educational Video(s) Outcome: Progressing   Problem: Coping: Goal: Ability to adjust to condition or change in health will improve Outcome: Progressing   Problem: Fluid  Volume: Goal: Ability to maintain a balanced intake and output will improve Outcome: Progressing   Problem: Health Behavior/Discharge Planning: Goal: Ability to identify and utilize available resources and services will improve Outcome: Progressing Goal: Ability to manage health-related needs will improve Outcome: Progressing   Problem: Metabolic: Goal: Ability to maintain appropriate glucose levels will improve Outcome: Progressing   Problem: Nutritional: Goal: Maintenance of adequate nutrition will improve Outcome: Progressing Goal: Progress toward achieving an optimal weight will improve Outcome: Progressing   Problem: Skin Integrity: Goal: Risk for impaired skin integrity will decrease Outcome: Progressing   Problem: Tissue Perfusion: Goal: Adequacy of tissue perfusion will improve Outcome: Progressing

## 2021-06-14 NOTE — Progress Notes (Signed)
**Note Karla-Identified via Obfuscation** EDWYNA, KAFKA ([id number]) Visit Report for 06/11/2021 Arrival Information Details Patient Name: Date of Service: [name] 06/11/2021 10:30 A M Medical Record Number: [id number] Patient Account Number: [id number] Date of Birth/Sex: Treating RN: [birth year] (60 y.o. [name] Primary Care Oather Mu[place]: [place] Other Clinician: Referring Makylie Rivere: Treating Cinsere Mizrahi/Extender: [name] [facility], [name] in Treatment: 16 Visit Information History Since Last Visit Added or deleted any medications: No Patient Arrived: Ambulatory Any new allergies or adverse reactions: No Arrival Time: 11:10 Had a fall or experienced change in No Accompanied By: self activities of daily living that may affect Transfer Assistance: None risk of falls: Patient Identification Verified: Yes Signs or symptoms of abuse/neglect since last visito No Secondary Verification Process Completed: Yes Hospitalized since last visit: No Patient Requires Transmission-Based Precautions: No Implantable device outside of the clinic excluding No Patient Has Alerts: No cellular tissue based products placed in the center since last visit: Has Dressing in Place as Prescribed: Yes Pain Present Now: No Electronic Signature(s) Signed: 06/14/2021 1:33:39 PM By: Sandre Kitty Entered By: Sandre Kitty on 06/11/2021 11:11:16 -------------------------------------------------------------------------------- Encounter Discharge Information Details Patient Name: Date of Service: Karla Corwin, [name] 06/11/2021 10:30 A M Medical Record Number: [id number] Patient Account Number: [id number] Date of Birth/Sex: Treating RN: Jun 02, 1961 (60 y.o. [name] Primary Care Burns Timson: [place], [place] Other Clinician: Referring Jailyne Chieffo: Treating Rawn Quiroa/Extender: [name] [facility], [name] in Treatment: 16 Encounter Discharge Information Items Post Procedure Vitals Discharge Condition:  Stable Temperature (F): 98.3 Ambulatory Status: Ambulatory Pulse (bpm): 62 Discharge Destination: Home Respiratory Rate (breaths/min): 20 Transportation: Private Auto Blood Pressure (mmHg): 110/66 Accompanied By: self Schedule Follow-up Appointment: Yes Clinical Summary of Care: Electronic Signature(s) Signed: 06/11/2021 1:14:47 PM By: Deon Pilling Entered By: Deon Pilling on 06/11/2021 13:05:55 -------------------------------------------------------------------------------- Lower Extremity Assessment Details Patient Name: Date of Service: [name] 06/11/2021 10:30 A M Medical Record Number: [id number] Patient Account Number: [id number] Date of Birth/Sex: Treating RN: 02-02-1961 (60 y.o. [name] Primary Care Ilze Roselli: [place] Other Clinician: Referring Jillane Po: Treating Hesston Hitchens/Extender: [name] Center, [name] in Treatment: 16 Edema Assessment Assessed: [Left: No] [Right: Yes] Edema: [Left: Ye] [Right: s] Calf Left: Right: Point of Measurement: From Medial Instep 42 cm Ankle Left: Right: Point of Measurement: From Medial Instep 23 cm Vascular Assessment Pulses: Dorsalis Pedis Palpable: [Right:Yes] Electronic Signature(s) Signed: 06/11/2021 1:14:47 PM By: Deon Pilling Entered By: Deon Pilling on 06/11/2021 11:22:15 -------------------------------------------------------------------------------- Multi Wound Chart Details Patient Name: Date of Service: Karla Corwin, DO [name] 06/11/2021 10:30 A M Medical Record Number: [id number] Patient Account Number: [id number] Date of Birth/Sex: Treating RN: 04/10/1961 (60 y.o. [name] Primary Care Andrej Spagnoli: Center, Romelle Starcher Other Clinician: Referring Alaiyah Bollman: Treating Manuelita Moxon/Extender: [name] [facility], [name] in Treatment: 16 Vital Signs Height(in): Pulse(bpm): 39 Weight(lbs): Blood Pressure(mmHg): 110/66 Body Mass Index(BMI): Temperature(F):  98.3 Respiratory Rate(breaths/min): 20 Photos: [16:Right, Plantar Amputation Site -] [N/A:N/A N/A] Wound Location: [16:Transmetatarsal Gradually Appeared] [N/A:N/A] Wounding Event: [16:Diabetic Wound/Ulcer of the Lower] [N/A:N/A] Primary Etiology: [16:Extremity Chronic Obstructive Pulmonary] [N/A:N/A] Comorbid History: [16:Disease (COPD), Sleep Apnea, Arrhythmia, Congestive Heart Failure, Coronary Artery Disease, Hypertension, Peripheral Arterial Disease, Cirrhosis , Type II Diabetes, Osteoarthritis, Neuropathy 02/05/2021] [N/A:N/A] Date Acquired: [16:16] [N/A:N/A] Weeks of Treatment: [16:Open] [N/A:N/A] Wound Status: [16:1x1.5x0.4] [N/A:N/A] Measurements L x W x D (cm) [16:1.178] [N/A:N/A] A (cm) : rea [16:0.471] [N/A:N/A] Volume (cm) : [16:27.90%] [N/A:N/A] % Reduction in A [16:rea: 3.90%] [N/A:N/A] % Reduction in Volume: [16:Grade 2] [N/A:N/A] Classification: [  16:Medium] [N/A:N/A] Exudate A mount: [16:Serosanguineous] [N/A:N/A] Exudate Type: [16:red, brown] [N/A:N/A] Exudate Color: [16:Thickened] [N/A:N/A] Wound Margin: [16:Large (67-100%)] [N/A:N/A] Granulation A mount: [16:Pink, Pale] [N/A:N/A] Granulation Quality: [16:Fat Layer (Subcutaneous Tissue): Yes N/A] Exposed Structures: [16:Fascia: No Tendon: No Muscle: No Joint: No Bone: No None] [N/A:N/A] Epithelialization: [16:Debridement - Excisional] [N/A:N/A] Debridement: Pre-procedure Verification/Time Out 12:00 [N/A:N/A] Taken: [16:Callus, Subcutaneous, Slough] [N/A:N/A] Tissue Debrided: [16:Skin/Subcutaneous Tissue] [N/A:N/A] Level: [16:1.5] [N/A:N/A] Debridement A (sq cm): [16:rea Curette] [N/A:N/A] Instrument: [16:Minimum] [N/A:N/A] Bleeding: [16:Pressure] [N/A:N/A] Hemostasis A chieved: [16:0] [N/A:N/A] Procedural Pain: [16:0] [N/A:N/A] Post Procedural Pain: [16:Procedure was tolerated well] [N/A:N/A] Debridement Treatment Response: [16:1x1.5x0.4] [N/A:N/A] Post Debridement Measurements L x W x D (cm) [16:0.471]  [N/A:N/A] Post Debridement Volume: (cm) [16:periwound callous.] [N/A:N/A] Assessment Notes: [16:Debridement] [N/A:N/A] Treatment Notes Electronic Signature(s) Signed: 06/11/2021 12:15:51 PM By: [name] DO Signed: 06/11/2021 2:27:43 PM By: Baruch Gouty RN, BSN Entered By: [name] on 06/11/2021 12:08:03 -------------------------------------------------------------------------------- Multi-Disciplinary Care Plan Details Patient Name: Date of Service: Karla Corwin, [name] 06/11/2021 10:30 A M Medical Record Number: [id number] Patient Account Number: [id number] Date of Birth/Sex: Treating RN: [birth year] (60 y.o. [name] Primary Care Seve Monette: [place] Other Clinician: Referring Ixel Boehning: Treating Trejan Buda/Extender: [name], [name] in Treatment: 16 Active Inactive Wound/Skin Impairment Nursing Diagnoses: Impaired tissue integrity Goals: Patient/caregiver will verbalize understanding of skin care regimen Date Initiated: 02/19/2021 Target Resolution Date: 07/02/2021 Goal Status: Active Ulcer/skin breakdown will have a volume reduction of 30% by week 4 Date Initiated: 02/19/2021 Date Inactivated: 03/29/2021 Target Resolution Date: 04/09/2021 Goal Status: Unmet Unmet Reason: PAD Interventions: Assess patient/caregiver ability to obtain necessary supplies Assess patient/caregiver ability to perform ulcer/skin care regimen upon admission and as needed Assess ulceration(s) every visit Provide education on ulcer and skin care Treatment Activities: Skin care regimen initiated : 02/19/2021 Topical wound management initiated : 02/19/2021 Notes: Electronic Signature(s) Signed: 06/11/2021 2:27:43 PM By: Baruch Gouty RN, BSN Entered By: Baruch Gouty on 06/11/2021 11:39:23 -------------------------------------------------------------------------------- Pain Assessment Details Patient Name: Date of Service: Karla Corwin, [name] 06/11/2021  10:30 A M Medical Record Number: [id number] Patient Account Number: [id number] Date of Birth/Sex: Treating RN: [birth year] (60 y.o. [name] Primary Care Onisha Cedeno: [place] Other Clinician: Referring Kele Withem: Treating Willetta York/Extender: [name] [facility], [name] in Treatment: 16 Active Problems Location of Pain Severity and Description of Pain Patient Has Paino No Site Locations Pain Management and Medication Current Pain Management: Electronic Signature(s) Signed: 06/11/2021 2:27:43 PM By: Baruch Gouty RN, BSN Signed: 06/14/2021 1:33:39 PM By: Sandre Kitty Entered By: Sandre Kitty on 06/11/2021 11:11:38 -------------------------------------------------------------------------------- Patient/Caregiver Education Details Patient Name: Date of Service: [name] 7/29/2022andnbsp10:30 A M Medical Record Number: [id number] Patient Account Number: [id number] Date of Birth/Gender: Treating RN: Jan 03, 1961 (60 y.o. [name] Primary Care Physician: [place], [place] Other Clinician: Referring Physician: Treating Physician/Extender: [name] Center, Jairo Ben in Treatment: 16 Education Assessment Education Provided To: Patient Education Topics Provided Smoking and Wound Healing: Methods: Explain/Verbal Responses: Reinforcements needed, State content correctly Wound/Skin Impairment: Methods: Explain/Verbal Responses: Reinforcements needed, State content correctly Electronic Signature(s) Signed: 06/11/2021 2:27:43 PM By: Baruch Gouty RN, BSN Entered By: Baruch Gouty on 06/11/2021 11:39:45 -------------------------------------------------------------------------------- Wound Assessment Details Patient Name: Date of Service: Karla Corwin, [name] 06/11/2021 10:30 A M Medical Record Number: [id number] Patient Account Number: [id number] Date of Birth/Sex: Treating RN: 11/04/1961 (60 y.o. [name] Primary  Care Andren Bethea: [place], [place] Other Clinician: Referring Larra Crunkleton: Treating Bertrum Helmstetter/Extender: Jannette Spanner, [place]  in Treatment: 16 Wound Status Wound Number: 16 Primary Diabetic Wound/Ulcer of the Lower Extremity Etiology: Wound Location: Right, Plantar Amputation Site - Transmetatarsal Wound Open Wounding Event: Gradually Appeared Status: Date Acquired: 02/05/2021 Comorbid Chronic Obstructive Pulmonary Disease (COPD), Sleep Apnea, Weeks Of Treatment: 16 History: Arrhythmia, Congestive Heart Failure, Coronary Artery Disease, Clustered Wound: No Hypertension, Peripheral Arterial Disease, Cirrhosis , Type II Diabetes, Osteoarthritis, Neuropathy Photos Wound Measurements Length: (cm) 1 Width: (cm) 1.5 Depth: (cm) 0.4 Area: (cm) 1.178 Volume: (cm) 0.471 % Reduction in Area: 27.9% % Reduction in Volume: 3.9% Epithelialization: None Tunneling: No Undermining: No Wound Description Classification: Grade 2 Wound Margin: Thickened Exudate Amount: Medium Exudate Type: Serosanguineous Exudate Color: red, brown Foul Odor After Cleansing: No Slough/Fibrino No Wound Bed Granulation Amount: Large (67-100%) Exposed Structure Granulation Quality: Pink, Pale Fascia Exposed: No Fat Layer (Subcutaneous Tissue) Exposed: Yes Tendon Exposed: No Muscle Exposed: No Joint Exposed: No Bone Exposed: No Assessment Notes periwound callous. Treatment Notes 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 Wound Cleanser Discharge Instruction: Cleanse the wound with wound cleanser or normal saline prior to applying a clean dressing using gauze sponges, not tissue or cotton balls. Peri-Wound Care Sween Lotion (Moisturizing lotion) Discharge Instruction: Apply moisturizing lotion as  directed Topical Primary Dressing KerraCel Ag Gelling Fiber Dressing, 2x2 in (silver alginate) Discharge Instruction: Apply silver alginate to wound bed as instructed Santyl Ointment Discharge Instruction: Apply in clinic only, under alginate Secondary Dressing Woven Gauze Sponge, Non-Sterile 4x4 in Discharge Instruction: Apply over primary dressing as directed. Optifoam Non-Adhesive Dressing, 4x4 in Discharge Instruction: Foam donut to help offload Secured With Compression Wrap Kerlix Roll 4.5x3.1 (in/yd) Discharge Instruction: Apply Kerlix and Coban compression as directed. Coban Self-Adherent Wrap 4x5 (in/yd) Discharge Instruction: Apply over Kerlix as directed. Compression Stockings Add-Ons Electronic Signature(s) Signed: 06/11/2021 1:14:47 PM By: Deon Pilling Signed: 06/11/2021 2:27:43 PM By: Baruch Gouty RN, BSN Entered By: Deon Pilling on 06/11/2021 11:22:31 -------------------------------------------------------------------------------- Vitals Details Patient Name: Date of Service: Karla Corwin, [name] 06/11/2021 10:30 A M Medical Record Number: [id number] Patient Account Number: [id number] Date of Birth/Sex: Treating RN: 08-Nov-1961 (60 y.o. [name] Primary Care Orlandus Borowski: [place] Other Clinician: Referring Kalese Ensz: Treating Lonnette Shrode/Extender: Jannette Spanner, [name] in Treatment: 16 Vital Signs Time Taken: 11:11 Temperature (F): 98.3 Pulse (bpm): 62 Respiratory Rate (breaths/min): 20 Blood Pressure (mmHg): 110/66 Reference Range: 80 - 120 mg / dl Electronic Signature(s) Signed: 06/14/2021 1:33:39 PM By: Sandre Kitty Entered By: Sandre Kitty on 06/11/2021 11:11:30

## 2021-06-15 ENCOUNTER — Telehealth (HOSPITAL_COMMUNITY): Payer: Self-pay

## 2021-06-15 ENCOUNTER — Other Ambulatory Visit (HOSPITAL_COMMUNITY): Payer: Self-pay

## 2021-06-15 NOTE — Progress Notes (Signed)
Paramedicine Encounter    Patient ID: [name], female    DOB: 1961/03/18, 60 y.o.   MRN: [id number]   Patient Care Team: [facility] as PCP - General Gwenlyn Found, [name], MD as PCP - Cardiology (Cardiology) [name], MD as PCP - Electrophysiology (Cardiology) [name], MD as Consulting Physician (Cardiology) [name], MD as Consulting Physician (Pulmonary Disease) System, Provider Not In  Patient Active Problem List   Diagnosis Date Noted   Bradycardia 06/13/2021   Uncontrolled type 2 diabetes mellitus with hyperglycemia ([place]) 06/13/2021   Acute kidney injury superimposed on CKD ([place]) 06/13/2021   Critical ischemia of foot ([place]) 06/07/2021   Fall 05/05/2021   PAF (paroxysmal atrial fibrillation) ([place]) 05/05/2021   COPD (chronic obstructive pulmonary disease) ([place]) 05/05/2021   DM (diabetes mellitus), type 2 with complications ([place]) 99991111   PAD (peripheral artery disease) ([place]) 05/05/2021   Cellulitis 05/05/2021   Acute on chronic combined systolic and diastolic CHF (congestive heart failure) ([place]) 05/05/2021   OSA (obstructive sleep apnea) 05/05/2021   CKD (chronic kidney disease) stage 3, GFR 30-59 ml/min ([place]) 05/05/2021   AKI (acute kidney injury) ([place]) 05/04/2021   Pressure injury of skin 05/04/2021   Ulcer of foot, unspecified laterality, limited to breakdown of skin ([place]) 02/17/2021   Altered mental status 01/02/2021   AF (paroxysmal atrial fibrillation) ([place]) 01/02/2021   CAD (coronary artery disease) 01/02/2021   PVD (peripheral vascular disease) ([place]) 01/02/2021   Acute renal failure superimposed on stage 4 chronic kidney disease ([place]) 01/02/2021   Diabetes mellitus type 2, uncontrolled, with complications ([place]) [id number]   Diabetic nephropathy ([place]) 01/02/2021   Low back pain 06/01/2020   Hyperlipidemia 04/03/2020   Muscle weakness 03/20/2020   Closed fracture of neck of right radius 03/13/2020   Pain in elbow  03/12/2020   PAD (peripheral artery disease) ([place]) 12/26/2019   Acute renal failure ([name])    Acute respiratory failure with hypoxia ([place]) 12/02/2019   Acute exacerbation of CHF (congestive heart failure) ([place]) 12/01/2019   Cellulitis in diabetic foot ([place]) 07/08/2019   Osteomyelitis ([place]) 06/21/2019   NICM (nonischemic cardiomyopathy) ([place]) 06/20/2019   Non-healing ulcer ([place]) 06/20/2019   Acute CHF (congestive heart failure) ([place]) 11/26/2018   CHF (congestive heart failure) (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 ([place]) 05/19/2017   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[place])    SIRS (systemic inflammatory response syndrome) ([place]) 04/06/2017   CKD (chronic kidney disease), stage III ([place]) 11/24/2016   Suspected sleep apnea 11/24/2016   Ulcer of toe of right foot, with necrosis of bone ([place]) 10/27/2016   Ulcer of left lower leg ([place]) 05/19/2016   Severe obesity (BMI >= 40) ([place]) 02/24/2016   COPD GOLD 0  02/24/2016   Morbid obesity ([place]) 02/24/2016   Encounter for therapeutic drug monitoring 02/10/2016   Symptomatic bradycardia 01/12/2016   Essential hypertension 12/22/2015   Chronic combined systolic and diastolic CHF (congestive heart failure) ([place])    [place]    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 11/08/2015   Tobacco abuse 10/23/2015   Coronary artery disease  DOE (dyspnea on exertion) 04/29/2015   Diabetes mellitus type 2, uncontrolled ([place]) 02/08/2015   Influenza A 02/07/2015   Wrist fracture, left, with routine healing, subsequent encounter 02/05/2015   Wrist fracture,  left, closed, initial encounter 01/29/2015   PAF (paroxysmal atrial fibrillation) ([place]) 01/16/2015   Carotid arterial disease ([name]) 01/16/2015   Claudication ([place]) 01/15/2015   Demand ischemia ([place]) 10/29/2014   Insomnia 02/03/2014   COPD with acute exacerbation ([place]) 02/01/2014   S/P peripheral artery angioplasty - TurboHawk atherectomy; R SFA 09/11/2013    Class: Acute   Leg pain, bilateral 08/19/2013   Hypothyroidism 07/31/2013   Cellulitis 06/13/2013   History of cocaine abuse ([place]) 06/13/2013   Long term current use of anticoagulant therapy 05/20/2013   Alcohol abuse    Narcotic abuse ([place])    Marijuana abuse    Alcoholic cirrhosis ([place])    DM (diabetes mellitus), type 2 with peripheral vascular complications ([name])     Current Outpatient Medications:    ACCU-CHEK GUIDE test strip, USE TO CHECK BLOOD SUGAR FOUR TIMES DAILY, Disp: , Rfl:    acetaminophen (TYLENOL) 500 MG tablet, Take 500 mg by mouth every 6 (six) hours as needed for moderate pain., Disp: , Rfl:    albuterol (VENTOLIN HFA) 108 (90 Base) MCG/ACT inhaler, Inhale 2 puffs into the lungs every 6 (six) hours as needed for wheezing or shortness of breath., Disp: 18 g, Rfl: 0   amiodarone (PACERONE) 200 MG tablet, Take 0.5 tablets (100 mg total) by mouth daily., Disp: 45 tablet, Rfl: 3   amLODipine (NORVASC) 10 MG tablet, Take 1 tablet (10 mg total) by mouth daily., Disp: 90 tablet, Rfl: 3   apixaban (ELIQUIS) 5 MG TABS tablet, Take 5 mg by mouth 2 (two) times daily., Disp: , Rfl:    atorvastatin (LIPITOR) 40 MG tablet, Take 40 mg by mouth every evening., Disp: , Rfl:    budesonide-formoterol (SYMBICORT) 160-4.5 MCG/ACT inhaler, Inhale 2 puffs into the lungs 2 (two) times daily., Disp: 1 Inhaler, Rfl: 2   cephALEXin (KEFLEX) 500 MG capsule, Take 1 capsule (500 mg total) by mouth 3 (three) times daily for 7 days., Disp: 21 capsule, Rfl: 0   clopidogrel (PLAVIX) 75 MG tablet, Take 1 tablet (75 mg total) by mouth daily with  breakfast., Disp: 90 tablet, Rfl: 1   Dulaglutide (TRULICITY) [id number] [id number] SOPN, Inject 0.75 mg into the skin every Friday., Disp: , Rfl:    empagliflozin (JARDIANCE) 10 MG TABS tablet, Take 1 tablet (10 mg total) by mouth daily before breakfast., Disp: 90 tablet, Rfl: 3   FLUoxetine (PROZAC) 10 MG capsule, Take 1 capsule (10 mg total) by mouth at bedtime., Disp: 30 capsule, Rfl: 0   folic acid (FOLVITE) 1 MG tablet, Take 1 mg by mouth daily., Disp: , Rfl:    hydrALAZINE (APRESOLINE) 25 MG tablet, Take 1 tablet (25 mg total) by mouth 3 (three) times daily., Disp: 90 tablet, Rfl: 11   insulin glargine (LANTUS) 100 UNIT/ML Solostar Pen, INJECT 20 UNITS INTO THE SKIN DAILY., Disp: 15 mL, Rfl: 0   insulin lispro (HUMALOG) 100 UNIT/ML KwikPen, Inject 8 Units into the skin 3 (three) times daily with meals., Disp: 15 mL, Rfl: 0   Insulin Pen Needle 32G X 4 MM MISC, USE AS DIRECTED WITH INSULIN PENS, Disp: 100 each, Rfl: 0   levothyroxine (SYNTHROID, LEVOTHROID) 50 MCG tablet, Take 1 tablet (50 mcg total) by mouth daily., Disp: 30 tablet, Rfl: 0   nortriptyline (PAMELOR) 50  MG capsule, Take 50 mg by mouth 2 (two) times daily., Disp: , Rfl:    Omega-3 Fatty Acids (FISH OIL PO), Take 1 capsule by mouth daily., Disp: , Rfl:    pantoprazole (PROTONIX) 40 MG tablet, Take 1 tablet (40 mg total) by mouth daily., Disp:  , Rfl:    torsemide (DEMADEX) 20 MG tablet, Take 4 tablets (80 mg total) by mouth every morning AND 3 tablets (60 mg total) every evening., Disp: 210 tablet, Rfl: 11   VITAMIN D PO, Take 1 tablet by mouth daily., Disp: , Rfl:    colchicine 0.6 MG tablet, Take 0.6 mg by mouth daily. (Patient not taking: Reported on 06/15/2021), Disp: , Rfl:    losartan (COZAAR) 100 MG tablet, Take 100 mg by mouth daily. (Patient not taking: Reported on 06/15/2021), Disp: , Rfl:    spironolactone (ALDACTONE) 25 MG tablet, Take 1 tablet (25 mg total) by mouth daily. (Patient not taking: Reported on 06/15/2021), Disp: 30  tablet, Rfl: 11 Allergies  Allergen Reactions   Gabapentin Nausea And Vomiting and Other (See Comments)    POSSIBLE SHAKING   Lyrica [Pregabalin] Other (See Comments)    Shaking       Social History   Socioeconomic History   Marital status: Single    Spouse name: Not on file   Number of children: 1   Years of education: 12   Highest education level: 12th grade  Occupational History   Occupation: disabled  Tobacco Use   Smoking status: Every Day    Packs/day: 1.00    Years: 44.00    Pack years: 44.00    Types: E-cigarettes, Cigarettes   Smokeless tobacco: Former    Types: Snuff  Vaping Use   Vaping Use: Former   Devices: 11/26/2018 "stopped months ago"  Substance and Sexual Activity   Alcohol use: Yes    Comment: occ   Drug use: Yes    Types: "Crack" cocaine, Marijuana, Oxycodone   Sexual activity: Not Currently  Other Topics Concern   Not on file  Social History Narrative   ** Merged History Encounter **       Lives in [place], in motel with sister.  They are looking to move but don't have a place to go yet.     Social Determinants of Health   Financial Resource Strain: Low Risk    Difficulty of Paying Living Expenses: Not very hard  Food Insecurity: Not on file  Transportation Needs: Not on file  Physical Activity: Not on file  Stress: Not on file  Social Connections: Not on file  Intimate Partner Violence: Not on file    Physical Exam      Future Appointments  Date Time Provider [place]  06/18/2021 10:30 AM Valinda Party, DO [facility]  06/22/2021  1:00 PM [facility] NL VASC 1 [facility]  06/25/2021  9:00 AM [name], MD [facility]  07/01/2021 10:30 AM Ricard Dillon, MD [facility]  07/13/2021  2:30 PM [facility] PA/NP [facility] None    BP 126/60   Pulse 68   Resp 20   Wt 227 lb (103 kg)   LMP  (LMP Unknown)   SpO2 97%   BMI 38.96 kg/m   Weight yesterday--in hosp Last visit weight-222  Pt was d/c  from hosp yesterday from bradycardia- Her bisoprolol was d/c.  She reports she is doing ok.  She denies dizziness or being "wobbly" that occurred when she went to ER.  Her foot is bleeding-she did contact therapist and he was going to reach the nurse to see if someone can come out today. Nurse called and will be there in an hour.  She did do her cardiomems this morning.   It has losartan listed in her chart but also it was d/c a few months ago for AKI and also theres several d/c notes and start notes on her spiro-sent message about that to see if she needs to take it or not.  Will let her know so sister can help her place the med where needed.  Meds verified and pill box refilled.    Marylouise Stacks, [place] [facility] Paramedic  06/15/21

## 2021-06-15 NOTE — Telephone Encounter (Signed)
Transitions of Care Pharmacy   Call attempted for a pharmacy transitions of care follow-up. Unable to leave voicemail.  Call attempt #3. Will no longer attempt follow up calls for [facility] pharmacy.

## 2021-06-16 ENCOUNTER — Other Ambulatory Visit (HOSPITAL_COMMUNITY): Payer: Self-pay | Admitting: *Deleted

## 2021-06-16 MED ORDER — LOSARTAN POTASSIUM 100 MG PO TABS: 100.0000 mg | ORAL_TABLET | Freq: Every day | ORAL | 6 refills | Status: DC

## 2021-06-18 ENCOUNTER — Encounter (HOSPITAL_BASED_OUTPATIENT_CLINIC_OR_DEPARTMENT_OTHER): Payer: Medicare (Managed Care) | Attending: Internal Medicine | Admitting: Internal Medicine

## 2021-06-18 ENCOUNTER — Other Ambulatory Visit: Payer: Self-pay

## 2021-06-18 DIAGNOSIS — S91301A Unspecified open wound, right foot, initial encounter: Secondary | ICD-10-CM | POA: Insufficient documentation

## 2021-06-18 DIAGNOSIS — I4891 Unspecified atrial fibrillation: Secondary | ICD-10-CM | POA: Diagnosis not present

## 2021-06-18 DIAGNOSIS — I872 Venous insufficiency (chronic) (peripheral): Secondary | ICD-10-CM | POA: Insufficient documentation

## 2021-06-18 DIAGNOSIS — N1832 Chronic kidney disease, stage 3b: Secondary | ICD-10-CM | POA: Diagnosis not present

## 2021-06-18 DIAGNOSIS — F17210 Nicotine dependence, cigarettes, uncomplicated: Secondary | ICD-10-CM | POA: Insufficient documentation

## 2021-06-18 DIAGNOSIS — J449 Chronic obstructive pulmonary disease, unspecified: Secondary | ICD-10-CM | POA: Insufficient documentation

## 2021-06-18 DIAGNOSIS — E1151 Type 2 diabetes mellitus with diabetic peripheral angiopathy without gangrene: Secondary | ICD-10-CM | POA: Diagnosis not present

## 2021-06-18 DIAGNOSIS — W19XXXA Unspecified fall, initial encounter: Secondary | ICD-10-CM | POA: Diagnosis not present

## 2021-06-18 DIAGNOSIS — I130 Hypertensive heart and chronic kidney disease with heart failure and stage 1 through stage 4 chronic kidney disease, or unspecified chronic kidney disease: Secondary | ICD-10-CM | POA: Diagnosis not present

## 2021-06-18 DIAGNOSIS — Z89431 Acquired absence of right foot: Secondary | ICD-10-CM | POA: Insufficient documentation

## 2021-06-18 DIAGNOSIS — Z6838 Body mass index (BMI) 38.0-38.9, adult: Secondary | ICD-10-CM | POA: Insufficient documentation

## 2021-06-18 DIAGNOSIS — E1140 Type 2 diabetes mellitus with diabetic neuropathy, unspecified: Secondary | ICD-10-CM | POA: Insufficient documentation

## 2021-06-18 DIAGNOSIS — E669 Obesity, unspecified: Secondary | ICD-10-CM | POA: Diagnosis not present

## 2021-06-18 DIAGNOSIS — E11621 Type 2 diabetes mellitus with foot ulcer: Secondary | ICD-10-CM

## 2021-06-18 DIAGNOSIS — S81802A Unspecified open wound, left lower leg, initial encounter: Secondary | ICD-10-CM | POA: Diagnosis not present

## 2021-06-18 DIAGNOSIS — I2510 Atherosclerotic heart disease of native coronary artery without angina pectoris: Secondary | ICD-10-CM | POA: Insufficient documentation

## 2021-06-18 DIAGNOSIS — L03116 Cellulitis of left lower limb: Secondary | ICD-10-CM | POA: Insufficient documentation

## 2021-06-18 DIAGNOSIS — I5042 Chronic combined systolic (congestive) and diastolic (congestive) heart failure: Secondary | ICD-10-CM | POA: Diagnosis not present

## 2021-06-18 DIAGNOSIS — E1122 Type 2 diabetes mellitus with diabetic chronic kidney disease: Secondary | ICD-10-CM | POA: Insufficient documentation

## 2021-06-18 NOTE — Progress Notes (Signed)
**Note Karla-Identified via Obfuscation** [name], OPLINGER ([id number]) Visit Report for 06/18/2021 Chief Complaint Document Details Patient Name: Date of Service: [name] 06/18/2021 10:30 A M Medical Record Number: [id number] Patient Account Number: [id number] Date of Birth/Sex: Treating RN: 09-11-1961 (60 y.o. [name] Primary Care Provider: [facility], [place] Other Clinician: Referring Provider: Treating Provider/Extender: Jannette Spanner, Jairo Ben in Treatment: 17 Information Obtained from: Patient Chief Complaint Right plantar foot wound Electronic Signature(s) Signed: 06/18/2021 12:14:15 PM By: [name] DO Entered By: [name] on 06/18/2021 12:07:07 -------------------------------------------------------------------------------- Debridement Details Patient Name: Date of Service: Karla Corwin, [name] 06/18/2021 10:30 A M Medical Record Number: [id number] Patient Account Number: [id number] Date of Birth/Sex: Treating RN: 1961/09/30 (60 y.o. [name] Primary Care Provider: [facility], [place] Other Clinician: Referring Provider: Treating Provider/Extender: [name] [facility], [name] in Treatment: 17 Debridement Performed for Assessment: Wound #16 Right,Plantar Amputation Site - Transmetatarsal Performed By: Physician [name], DO Debridement Type: Debridement Severity of Tissue Pre Debridement: Fat layer exposed Level of Consciousness (Pre-procedure): Awake and Alert Pre-procedure Verification/Time Out Yes - 11:45 Taken: Start Time: 11:45 T Area Debrided (L x W): otal 1.2 (cm) x 1.5 (cm) = 1.8 (cm) Tissue and other material debrided: Viable, Non-Viable, Slough, Subcutaneous, Slough Level: Skin/Subcutaneous Tissue Debridement Description: Excisional Instrument: Curette Bleeding: Minimum Hemostasis Achieved: Pressure End Time: 11:47 Procedural Pain: 0 Post Procedural Pain: 0 Response to Treatment: Procedure was tolerated well Level of Consciousness  (Post- Awake and Alert procedure): Post Debridement Measurements of Total Wound Length: (cm) 1.2 Width: (cm) 1.5 Depth: (cm) 0.3 Volume: (cm) 0.424 Character of Wound/Ulcer Post Debridement: Requires Further Debridement Severity of Tissue Post Debridement: Fat layer exposed Post Procedure Diagnosis Same as Pre-procedure Electronic Signature(s) Signed: 06/18/2021 12:14:15 PM By: [name] DO Signed: 06/18/2021 12:35:09 PM By: Levan Hurst RN, BSN Entered By: Levan Hurst on 06/18/2021 11:51:10 -------------------------------------------------------------------------------- HPI Details Patient Name: Date of Service: Karla Corwin, [name] 06/18/2021 10:30 A M Medical Record Number: [id number] Patient Account Number: [id number] Date of Birth/Sex: Treating RN: [birth year] (60 y.o. [name] Primary Care Provider: [place] Other Clinician: Referring Provider: Treating Provider/Extender: Jannette Spanner, [name] in Treatment: 17 History of Present Illness HPI Description: This 60 year old patient who has a very long significant history of diabetes mellitus, previous alcohol and nicotine abuse, chronic data disease, COPD, diabetes mellitus, height hypertension, critical lower limb ischemia with several wounds being managed at the wound Center at [facility] for over a year. She was recently in the ER at [facility] and was referred to our center. He has had a long history of critical limb ischemia and over a period of time has had balloon angioplasties in March 2016, endarterectomy of the left carotid, lower extremity angiogram and treatment by Dr. Quay Burow, several cardiac catheterizations. Most recently she had an x-ray of her left foot while in the ER which showed no acute abnormality. during this ER visit she was started on ciprofloxacin and asked to continue with the wound care physicians at [facility]. Her last ABI done in June 2017 showed  the right side to be 0.28 in the left side to be 0.48. Her right toe brachial index was 0.17 on the right and 0.27 on the left. her last hemoglobin A1c was 11.1% She continues to smoke about a pack of cigarettes a day. 03/28/2017 -- -- right foot x-ray -- IMPRESSION:Areas of soft tissue swelling. Mild subluxation second PIP joint. No frank dislocation or fracture.  No erosive change or bony destruction. No soft tissue ulceration or radiopaque foreign body evident by radiography. There is plantar fascia calcification with a nearby inferior calcaneal spur. There are foci of arterial vascular calcification. 04/04/2017 -- the patient continues to be noncompliant and continues to complain of a lot of pain and has not done anything about quitting smoking Readmission: 06/26/18 on evaluation today patient presents for reevaluation she has not been seen in our office since May 2018. Since she was last seen here in our office she has undergone testing at Dr. Elspeth Cho office where it was revealed that she had an ABI of 0.68 with her previous ABI being 0.64 and a left ABI of 0.38 with previous ABI being 0.34 this study which was performed on 04/05/18 was pretty much equivalent to the study performed on 11/22/17. The findings in the end revealed on the right would appear to be moderate right lower extremity arterial disease on the left Dr. Gwenlyn Found states moderate in the report but unfortunately this appears to be much more severe at 0.38 compared to the right. Subsequently the patient was scheduled to have angiography with Dr. Gwenlyn Found on 04/12/18. This was however canceled due to the fact that the patient was found to have chronic kidney disease stage III and it was to the point that he did not feel that it will be safe to pursue angiography at that point. She has not been on any antibiotics recently. At this point Dr. Gwenlyn Found has not rescheduled anything as far as the injured [place] according to the patient he is  somewhat reluctant to do so. Nonetheless with her diminished blood flow this is gonna make it somewhat difficult for her to heal. 07/05/18; this is a patient I have not seen previously. She has very significant PAD as noted above. She apparently has had revascularization efforts by Dr. Gwenlyn Found on the right on 3 occasions to the patient. She was supposed to have an attempted angiography on the left however this was canceled apparently because of stage III chronic renal failure. I will need to research all of this. She complains of significant pain in the wound and has claudication enough that when she walks to the end of the driveway she has to stop. She is been using silver alginate to the wounds on her legs and Iodoflex to the area on her left second toe. 07/13/18 on evaluation today patient's wounds actually appear to be doing about the same. She has an appointment she tells me within the next month that is September 2019 with Dr. Gwenlyn Found to discuss options to see if there's anything else that you can recommend or do for her. Nonetheless obviously what we're trying to do is do what we can to save her leg and in turn prevent any additional worsening or damage. None in the meantime we been mainly trying to manage her ulcers as best we can. 07/27/18; some improvement in the multiplicity of wounds on her left lower extremity and foot. She's been using silver alginate. I was unable to determine that she actually has an appointment with Dr. Gwenlyn Found. We are checking into this The patient's wound includes Left lateral foot, left plantar heel, her left anterior calf wound looks close to me, left fourth toe is very close to closed and the left medial calf is perhaps the largest open area READMISSION 02/19/2021 This is a now 59 year old woman with type 2 diabetes and continued cigarette smoking. She has known PAD. She was last in  this clinic in 2019 at that point with wounds on her left foot. She left in a nonhealed  state. On 06/28/2019 I see she had a left femoropopliteal by vein and vascular with an amputation of the fifth ray. These managed to heal over. Her last arterial studies were in February 2021. This showed an absent waveform at the posterior tibial artery on the right a dampened monophasic dorsalis pedis on the right of 0.44. On the left again the PTA TA was absent her dorsalis pedis was 0.99 triphasic and her great toe pressure was 0.65. This would have been after her revascularization. Once again she tells me that Dr. Gwenlyn Found has done revascularization on the right leg on 3 different occasions. She has a right transmetatarsal amputation apparently done by Dr. Sharol Given remotely but I do not see a note for these right lower extremity revascularization but I may not of looked back far enough. In any case she says that the she has a wound on the plantar aspect of the right transmetatarsal amputation site there is been there for several months. More recently she fell and had a wound on her right medial lower leg she had 5 sutures placed in the ER and then subsequently she has developed an area on the medial lower leg which was a blister that opened up. Past medical history includes type 2 diabetes, PAD, chronic renal failure, congestive heart failure, right transmetatarsal amputation, left fifth ray amputation, continued tobacco abuse, atrial fibrillation and cirrhosis. We did not attempt an ABI on the right leg today because of pain 02/26/2021; patient we admitted to the clinic last week. She has what looks to be 2 areas on her right medial and right anterior lower leg that look more like venous wounds but she has 1 on the first met head at the base of her right transmet. She has known severe PAD. She complains of a lot of pain although some of this may be neuropathic. Is difficult to exclude a component of claudication. Use silver alginate on the wounds on her legs and Iodoflex on the area on the first met  head. She has an appointment with Dr. Gwenlyn Found on 5/25 although I will text him and discussed the situation. She is have poorly controlled diabetic. She has has stage IIIb chronic renal failure 4/22; patient presents for 1 week follow-up. The 2 areas on her right medial and right anterior lower leg appear well-healing. She has been using silver alginate to this area without issues. She has a first met head ulcer and it is unclear how long this has been there as it was discovered in the ED earlier this month when she was being evaluated for another issue. Iodoflex has been used at this area. 4/29; patient presents for 1 week follow-up. She has been using silver alginate to the leg wounds and Iodoflex to the plantar foot wound. She has had this wrapped with Coban and Kerlix. She has no concerns or complaints today. 5/6; this is a difficult wound on the right plantar foot transmit site. We are not making a lot of progress. She had 2 more venous looking wounds on the right medial and right anterior lower leg 1 of which is healed. Her appointment with Dr. Gwenlyn Found is on 5/25 5/16; she did not tolerate the offloading shoe we gave her in fact she had a fall with an abrasion on her right forearm she is now back in the modified small shoe which does not offload her foot  properly. Her appoint with Dr. Gwenlyn Found is still on 5/25 we have been using Iodoflex. The area on the right anterior lower leg is healed 7/18; patient presents for follow-up however has not been seen in 2 months. She was last seen at the end of May. She had a fall at the end of June and was hospitalized. She has been unable to follow-up since then. For the wound she has only been keeping it covered with gauze. She is scheduled for an aortogram with lower extremity runoff at the end of July. 7/29; patient presents for 2-week follow-up. She has home health and they have been changing her dressings. She denies any issues and has no complaints today. She  states she had a procedure where they opened up one of her vessels in her legs. She denies signs of infection. 8/5; patient presents for follow-up. She was recently in the hospital for bradycardia. She was noted to have cellulitis to the left lower extremity and Unna boot was placed due to blisters and increased swelling. She reports improvement to her left leg since being in the hospital and stability check her right plantar foot wound. She denies signs of infection. Electronic Signature(s) Signed: 06/18/2021 12:14:15 PM By: [name] DO Entered By: [name] on 06/18/2021 12:09:46 -------------------------------------------------------------------------------- Physical Exam Details Patient Name: Date of Service: Karla Corwin, [name] 06/18/2021 10:30 A M Medical Record Number: [id number] Patient Account Number: [id number] Date of Birth/Sex: Treating RN: 07-22-1961 (60 y.o. [name] Primary Care Provider: [facility], Romelle Starcher Other Clinician: Referring Provider: Treating Provider/Extender: [name] [facility], [name] in Treatment: 17 Constitutional respirations regular, non-labored and within target range for patient.. Cardiovascular 2+ dorsalis pedis/posterior tibialis pulses. Psychiatric pleasant and cooperative. Notes Right foot: Trans-met with open wound with nonviable tissue and granulation tissue present. Left lower extremity with scattered open wounds limited to skin breakdown. Appears to be previous blister sites. No signs of infection Electronic Signature(s) Signed: 06/18/2021 12:14:15 PM By: [name] DO Entered By: [name] on 06/18/2021 12:10:47 -------------------------------------------------------------------------------- Physician Orders Details Patient Name: Date of Service: Karla Corwin, [name] 06/18/2021 10:30 A M Medical Record Number: [id number] Patient Account Number: [id number] Date of Birth/Sex: Treating RN: 11-Feb-1961 (60  y.o. [name] Primary Care Provider: [facility], [place] Other Clinician: Referring Provider: Treating Provider/Extender: Jannette Spanner, Jairo Ben in Treatment: 17 Verbal / Phone Orders: No Diagnosis Coding ICD-10 Coding Code Description 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 2 diabetes mellitus with foot ulcer S91.301D Unspecified open wound, right foot, subsequent encounter E11.51 Type 2 diabetes mellitus with diabetic peripheral angiopathy without gangrene Follow-up Appointments Return appointment in 3 weeks. - with Dr. Dellia Nims Bathing/ Shower/ Hygiene May shower with protection but do not get wound dressing(s) wet. Edema Control - Lymphedema / SCD / Other Elevate legs to the level of the heart or above for 30 minutes daily and/or when sitting, a frequency of: - throughout the day Avoid standing for long periods of time. Exercise regularly Off-Loading Open toe surgical shoe to: - with felt padding to offload Additional Orders / Instructions Stop/Decrease Smoking Follow Nutritious Diet Home Health No change in wound care orders this week; continue Home Health for wound care. May utilize formulary equivalent dressing for wound treatment orders unless otherwise specified. Other Home Health Orders/Instructions: - [facility] Wound Treatment Wound #16 - Amputation Site - Transmetatarsal Wound Laterality: Plantar, Right Cleanser: Soap and Water 2 x Per Week/15 Days Discharge Instructions: May shower and wash wound with dial antibacterial soap and water prior  to dressing change. Cleanser: Wound Cleanser (Home Health) 2 x Per Week/15 Days Discharge Instructions: Cleanse the wound with wound cleanser or normal saline prior to applying a clean dressing using gauze sponges, not tissue or cotton balls. Peri-Wound Care: Sween Lotion (Moisturizing lotion) (Home Health) 2 x Per Week/15 Days Discharge Instructions: Apply moisturizing lotion as directed Prim Dressing: KerraCel Ag Gelling Fiber  Dressing, 2x2 in (silver alginate) (Home Health) 2 x Per Week/15 Days ary Discharge Instructions: Apply silver alginate to wound bed as instructed Prim Dressing: Santyl Ointment 2 x Per Week/15 Days ary Discharge Instructions: Apply in clinic only, under alginate Secondary Dressing: Woven Gauze Sponge, Non-Sterile 4x4 in (Home Health) 2 x Per Week/15 Days Discharge Instructions: Apply over primary dressing as directed. Secondary Dressing: Optifoam Non-Adhesive Dressing, 4x4 in (Home Health) 2 x Per Week/15 Days Discharge Instructions: Foam donut to help offload Compression Wrap: Kerlix Roll 4.5x3.1 (in/yd) (Home Health) 2 x Per Week/15 Days Discharge Instructions: Apply Kerlix and Coban compression as directed. Compression Wrap: Coban Self-Adherent Wrap 4x5 (in/yd) (Home Health) 2 x Per Week/15 Days Discharge Instructions: Apply over Kerlix as directed. Wound #20 - Lower Leg Wound Laterality: Left, Anterior Cleanser: Soap and Water 2 x Per Week/15 Days Discharge Instructions: May shower and wash wound with dial antibacterial soap and water prior to dressing change. Cleanser: Wound Cleanser (Home Health) 2 x Per Week/15 Days Discharge Instructions: Cleanse the wound with wound cleanser or normal saline prior to applying a clean dressing using gauze sponges, not tissue or cotton balls. Peri-Wound Care: Sween Lotion (Moisturizing lotion) (Home Health) 2 x Per Week/15 Days Discharge Instructions: Apply moisturizing lotion as directed Prim Dressing: KerraCel Ag Gelling Fiber Dressing, 2x2 in (silver alginate) (Home Health) 2 x Per Week/15 Days ary Discharge Instructions: Apply silver alginate to wound bed as instructed Secondary Dressing: Woven Gauze Sponge, Non-Sterile 4x4 in (Home Health) 2 x Per Week/15 Days Discharge Instructions: Apply over primary dressing as directed. Secondary Dressing: ABD Pad, 5x9 (Home Health) 2 x Per Week/15 Days Discharge Instructions: Apply over primary dressing  as directed. Compression Wrap: Kerlix Roll 4.5x3.1 (in/yd) (Home Health) 2 x Per Week/15 Days Discharge Instructions: Apply Kerlix and Coban compression as directed. Compression Wrap: Coban Self-Adherent Wrap 4x5 (in/yd) (Home Health) 2 x Per Week/15 Days Discharge Instructions: Apply over Kerlix as directed. Electronic Signature(s) Signed: 06/18/2021 12:14:15 PM By: [name] DO Entered By: [name] on 06/18/2021 12:11:02 -------------------------------------------------------------------------------- Problem List Details Patient Name: Date of Service: Karla Corwin, [name] 06/18/2021 10:30 A M Medical Record Number: [id number] Patient Account Number: [id number] Date of Birth/Sex: Treating RN: June 25, 1961 (60 y.o. [name] Primary Care Provider: [facility] [place] Other Clinician: Referring Provider: Treating Provider/Extender: [name] [facility], [name] in Treatment: 17 Active Problems ICD-10 Encounter Code Description Active Date MDM Diagnosis 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 2 diabetes mellitus with foot ulcer 02/19/2021 No Yes S91.301D Unspecified open wound, right foot, subsequent encounter 05/31/2021 No Yes E11.51 Type 2 diabetes mellitus with diabetic peripheral angiopathy without gangrene 02/19/2021 No Yes S81.802A Unspecified open wound, left lower leg, initial encounter 06/18/2021 No Yes I87.2 Venous insufficiency (chronic) (peripheral) 06/18/2021 No Yes Inactive Problems ICD-10 Code Description Active Date Inactive Date L97.811 Non-pressure chronic ulcer of other part of right lower leg limited to breakdown of skin 02/19/2021 02/19/2021 Resolved Problems ICD-10 Code Description Active Date Resolved Date S40.811D Abrasion of right upper arm, subsequent encounter 03/29/2021 03/29/2021 Electronic Signature(s) Signed: 06/18/2021 12:14:15 PM By: [name] DO Entered By: [name] on 06/18/2021  12:03:53 -------------------------------------------------------------------------------- Progress Note Details Patient Name: Date of Service: [name]. 06/18/2021 10:30 A M Medical Record Number: [id number] Patient Account Number: [id number] Date of Birth/Sex: Treating RN: January 12, 1961 (60 y.o. [name] Primary Care Provider: [facility], Romelle Starcher Other Clinician: Referring Provider: Treating Provider/Extender: [name] [facility], Jairo Ben in Treatment: 17 Subjective Chief Complaint Information obtained from Patient Right plantar foot wound History of Present Illness (HPI) This 60 year old patient who has a very long significant history of diabetes mellitus, previous alcohol and nicotine abuse, chronic data disease, COPD, diabetes mellitus, height hypertension, critical lower limb ischemia with several wounds being managed at the wound Center at [facility] for over a year. She was recently in the ER at [facility] and was referred to our center. He has had a long history of critical limb ischemia and over a period of time has had balloon angioplasties in March 2016, endarterectomy of the left carotid, lower extremity angiogram and treatment by Dr. Quay Burow, several cardiac catheterizations. Most recently she had an x-ray of her left foot while in the ER which showed no acute abnormality. during this ER visit she was started on ciprofloxacin and asked to continue with the wound care physicians at [facility]. Her last ABI done in June 2017 showed the right side to be 0.28 in the left side to be 0.48. Her right toe brachial index was 0.17 on the right and 0.27 on the left. her last hemoglobin A1c was 11.1% She continues to smoke about a pack of cigarettes a day. 03/28/2017 -- -- right foot x-ray -- IMPRESSION:Areas of soft tissue swelling. Mild subluxation second PIP joint. No frank dislocation or fracture. No erosive change or bony destruction. No soft  tissue ulceration or radiopaque foreign body evident by radiography. There is plantar fascia calcification with a nearby inferior calcaneal spur. There are foci of arterial vascular calcification. 04/04/2017 -- the patient continues to be noncompliant and continues to complain of a lot of pain and has not done anything about quitting smoking Readmission: 06/26/18 on evaluation today patient presents for reevaluation she has not been seen in our office since May 2018. Since she was last seen here in our office she has undergone testing at Dr. Fransico Michael office where it was revealed that she had an ABI of 0.68 with her previous ABI being 0.64 and a left ABI of 0.38 with previous ABI being 0.34 this study which was performed on 04/05/18 was pretty much equivalent to the study performed on 11/22/17. The findings in the end revealed on the right would appear to be moderate right lower extremity arterial disease on the left Dr. Gwenlyn Found states moderate in the report but unfortunately this appears to be much more severe at 0.38 compared to the right. Subsequently the patient was scheduled to have angiography with Dr. Gwenlyn Found on 04/12/18. This was however canceled due to the fact that the patient was found to have chronic kidney disease stage III and it was to the point that he did not feel that it will be safe to pursue angiography at that point. She has not been on any antibiotics recently. At this point Dr. Gwenlyn Found has not rescheduled anything as far as the injured [place] according to the patient he is somewhat reluctant to do so. Nonetheless with her diminished blood flow this is gonna make it somewhat difficult for her to heal. 07/05/18; this is a patient I have not seen previously. She has very significant PAD as  noted above. She apparently has had revascularization efforts by Dr. Gwenlyn Found on the right on 3 occasions to the patient. She was supposed to have an attempted angiography on the left however this was  canceled apparently because of stage III chronic renal failure. I will need to research all of this. She complains of significant pain in the wound and has claudication enough that when she walks to the end of the driveway she has to stop. She is been using silver alginate to the wounds on her legs and Iodoflex to the area on her left second toe. 07/13/18 on evaluation today patient's wounds actually appear to be doing about the same. She has an appointment she tells me within the next month that is September 2019 with Dr. Gwenlyn Found to discuss options to see if there's anything else that you can recommend or do for her. Nonetheless obviously what we're trying to do is do what we can to save her leg and in turn prevent any additional worsening or damage. None in the meantime we been mainly trying to manage her ulcers as best we can. 07/27/18; some improvement in the multiplicity of wounds on her left lower extremity and foot. She's been using silver alginate. I was unable to determine that she actually has an appointment with Dr. Gwenlyn Found. We are checking into this The patient's wound includes Left lateral foot, left plantar heel, her left anterior calf wound looks close to me, left fourth toe is very close to closed and the left medial calf is perhaps the largest open area READMISSION 02/19/2021 This is a now 60 year old woman with type 2 diabetes and continued cigarette smoking. She has known PAD. She was last in this clinic in 2019 at that point with wounds on her left foot. She left in a nonhealed state. On 06/28/2019 I see she had a left femoropopliteal by vein and vascular with an amputation of the fifth ray. These managed to heal over. Her last arterial studies were in February 2021. This showed an absent waveform at the posterior tibial artery on the right a dampened monophasic dorsalis pedis on the right of 0.44. On the left again the PTA TA was absent her dorsalis pedis was 0.99 triphasic and her  great toe pressure was 0.65. This would have been after her revascularization. Once again she tells me that Dr. Gwenlyn Found has done revascularization on the right leg on 3 different occasions. She has a right transmetatarsal amputation apparently done by Dr. Sharol Given remotely but I do not see a note for these right lower extremity revascularization but I may not of looked back far enough. In any case she says that the she has a wound on the plantar aspect of the right transmetatarsal amputation site there is been there for several months. More recently she fell and had a wound on her right medial lower leg she had 5 sutures placed in the ER and then subsequently she has developed an area on the medial lower leg which was a blister that opened up. Past medical history includes type 2 diabetes, PAD, chronic renal failure, congestive heart failure, right transmetatarsal amputation, left fifth ray amputation, continued tobacco abuse, atrial fibrillation and cirrhosis. We did not attempt an ABI on the right leg today because of pain 02/26/2021; patient we admitted to the clinic last week. She has what looks to be 2 areas on her right medial and right anterior lower leg that look more like venous wounds but she has 1 on the first  met head at the base of her right transmet. She has known severe PAD. She complains of a lot of pain although some of this may be neuropathic. Is difficult to exclude a component of claudication. Use silver alginate on the wounds on her legs and Iodoflex on the area on the first met head. She has an appointment with Dr. Gwenlyn Found on 5/25 although I will text him and discussed the situation. She is have poorly controlled diabetic. She has has stage IIIb chronic renal failure 4/22; patient presents for 1 week follow-up. The 2 areas on her right medial and right anterior lower leg appear well-healing. She has been using silver alginate to this area without issues. She has a first met head ulcer  and it is unclear how long this has been there as it was discovered in the ED earlier this month when she was being evaluated for another issue. Iodoflex has been used at this area. 4/29; patient presents for 1 week follow-up. She has been using silver alginate to the leg wounds and Iodoflex to the plantar foot wound. She has had this wrapped with Coban and Kerlix. She has no concerns or complaints today. 5/6; this is a difficult wound on the right plantar foot transmit site. We are not making a lot of progress. She had 2 more venous looking wounds on the right medial and right anterior lower leg 1 of which is healed. Her appointment with Dr. Gwenlyn Found is on 5/25 5/16; she did not tolerate the offloading shoe we gave her in fact she had a fall with an abrasion on her right forearm she is now back in the modified small shoe which does not offload her foot properly. Her appoint with Dr. Gwenlyn Found is still on 5/25 we have been using Iodoflex. The area on the right anterior lower leg is healed 7/18; patient presents for follow-up however has not been seen in 2 months. She was last seen at the end of May. She had a fall at the end of June and was hospitalized. She has been unable to follow-up since then. For the wound she has only been keeping it covered with gauze. She is scheduled for an aortogram with lower extremity runoff at the end of July. 7/29; patient presents for 2-week follow-up. She has home health and they have been changing her dressings. She denies any issues and has no complaints today. She states she had a procedure where they opened up one of her vessels in her legs. She denies signs of infection. 8/5; patient presents for follow-up. She was recently in the hospital for bradycardia. She was noted to have cellulitis to the left lower extremity and Unna boot was placed due to blisters and increased swelling. She reports improvement to her left leg since being in the hospital and stability check her  right plantar foot wound. She denies signs of infection. Patient History Information obtained from Patient. Family History Cancer - Mother, Diabetes - Mother, Heart Disease - Mother,Father, Hypertension - Mother,Father, Lung Disease - Siblings, Stroke - Mother, Thyroid Problems - Mother, No family history of Hereditary Spherocytosis, Kidney Disease, Seizures, Tuberculosis. Social History Former smoker, Marital Status - Divorced, Alcohol Use - Never, Drug Use - Current History - marijuana, Caffeine Use - Daily - Coffee. Medical History Eyes Denies history of Cataracts, Glaucoma, Optic Neuritis Ear/Nose/Mouth/Throat Denies history of Chronic sinus problems/congestion, Middle ear problems Hematologic/Lymphatic Denies history of Anemia, Hemophilia, Human Immunodeficiency Virus, Lymphedema, Sickle Cell Disease Respiratory Patient has history of Chronic  Obstructive Pulmonary Disease (COPD), Sleep Apnea Denies history of Aspiration, Asthma, Pneumothorax, Tuberculosis Cardiovascular Patient has history of Arrhythmia - NSVT , PAF , PAT Congestive Heart Failure - chronic combined, Coronary Artery Disease, Hypertension, Peripheral , Arterial Disease Denies history of Angina, Deep Vein Thrombosis, Hypotension, Myocardial Infarction, Peripheral Venous Disease, Phlebitis, Vasculitis Gastrointestinal Patient has history of Cirrhosis - alcoholic Denies history of Colitis, Crohnoos, Hepatitis A, Hepatitis B, Hepatitis C Endocrine Patient has history of Type II Diabetes Denies history of Type I Diabetes Genitourinary Denies history of End Stage Renal Disease Immunological Denies history of Lupus Erythematosus, Raynaudoos, Scleroderma Integumentary (Skin) Denies history of History of Burn Musculoskeletal Patient has history of Osteoarthritis Denies history of Gout, Rheumatoid Arthritis, Osteomyelitis Neurologic Patient has history of Neuropathy - dm2 peripheral Denies history of Dementia,  Quadriplegia, Paraplegia, Seizure Disorder Oncologic Denies history of Received Chemotherapy, Received Radiation Psychiatric Denies history of Anorexia/bulimia, Confinement Anxiety Hospitalization/Surgery History - balloon angioplasty. - cardiac cath X2. - cardioversionX2. - carotid angiogram. - DandC uterus. - endarcdectomy (L). - (L) heart cath. - lower ext angiogram X2. - peripheral athrectomy. - 05/03/2021-05/05/2021 inpatient cone-fall CKD. Medical A Surgical History Notes nd Constitutional Symptoms (General Health) alcohol abuse , cocaine abuse , marijuana abuse , narcotic abuse , noncompliance , obesity , tobacco abuse , insomnia , Hematologic/Lymphatic B12 deficiency Respiratory wheezing , Cardiovascular cardiomyopathy , critical lower limb ischemia , elevated troponin , hyperlipidemia , hypokalemia , hypomagnesiumia , symptomatic bradycardia , carotid arterial disease ,demand ischemia , Gastrointestinal GERD Endocrine hypothyroidism Genitourinary renal insufficiency , CKD Stage 3 , Integumentary (Skin) cellulitis Psychiatric anxiety Objective Constitutional respirations regular, non-labored and within target range for patient.. Vitals Time Taken: 10:57 AM, Temperature: 98.5 F, Pulse: 48 bpm, Respiratory Rate: 20 breaths/min, Blood Pressure: 109/63 mmHg. Cardiovascular 2+ dorsalis pedis/posterior tibialis pulses. Psychiatric pleasant and cooperative. General Notes: Right foot: Trans-met with open wound with nonviable tissue and granulation tissue present. Left lower extremity with scattered open wounds limited to skin breakdown. Appears to be previous blister sites. No signs of infection Integumentary (Hair, Skin) Wound #16 status is Open. Original cause of wound was Gradually Appeared. The date acquired was: 02/05/2021. The wound has been in treatment 17 weeks. The wound is located on the Right,Plantar Amputation Site - Transmetatarsal. The wound measures 1.2cm length  x 1.5cm width x 0.3cm depth; 1.414cm^2 area and 0.424cm^3 volume. There is Fat Layer (Subcutaneous Tissue) exposed. There is no tunneling or undermining noted. There is a medium amount of serosanguineous drainage noted. The wound margin is thickened. There is large (67-100%) pink, pale granulation within the wound bed. There is no necrotic tissue within the wound bed. Wound #20 status is Open. Original cause of wound was Blister. The date acquired was: 06/14/2021. The wound is located on the Left,Anterior Lower Leg. The wound measures 6cm length x 1.1cm width x 0.1cm depth; 5.184cm^2 area and 0.518cm^3 volume. There is Fat Layer (Subcutaneous Tissue) exposed. There is no tunneling or undermining noted. There is a medium amount of serosanguineous drainage noted. The wound margin is flat and intact. There is large (67- 100%) pink granulation within the wound bed. There is no necrotic tissue within the wound bed. Assessment Active Problems ICD-10 Type 2 diabetes mellitus with foot ulcer Unspecified open wound, right foot, subsequent encounter Type 2 diabetes mellitus with diabetic peripheral angiopathy without gangrene Unspecified open wound, left lower leg, initial encounter Venous insufficiency (chronic) (peripheral) Patient's right plantar foot wound has shown some improvement in appearance. I debrided nonviable tissue.  I recommended continuing with silver alginate under Kerlix/Coban. Her left lower extremity has areas of skin breakdown due to increased swelling from venous insufficiency. I recommended doing silver alginate and Kerlix/Coban to her left leg. No signs of infection on exam. Home health changes her dressings. She is going on vacation on the week of the 15th. I recommended she follow-up the following week. Procedures Wound #16 Pre-procedure diagnosis of Wound #16 is a Diabetic Wound/Ulcer of the Lower Extremity located on the Right,Plantar Amputation Site - Transmetatarsal .Severity  of Tissue Pre Debridement is: Fat layer exposed. There was a Excisional Skin/Subcutaneous Tissue Debridement with a total area of 1.8 sq cm performed by [name], DO. With the following instrument(s): Curette to remove Viable and Non-Viable tissue/material. Material removed includes Subcutaneous Tissue and Slough and. No specimens were taken. A time out was conducted at 11:45, prior to the start of the procedure. A Minimum amount of bleeding was controlled with Pressure. The procedure was tolerated well with a pain level of 0 throughout and a pain level of 0 following the procedure. Post Debridement Measurements: 1.2cm length x 1.5cm width x 0.3cm depth; 0.424cm^3 volume. Character of Wound/Ulcer Post Debridement requires further debridement. Severity of Tissue Post Debridement is: Fat layer exposed. Post procedure Diagnosis Wound #16: Same as Pre-Procedure Plan Follow-up Appointments: Return appointment in 3 weeks. - with Dr. Arcola Jansky Shower/ Hygiene: May shower with protection but do not get wound dressing(s) wet. Edema Control - Lymphedema / SCD / Other: Elevate legs to the level of the heart or above for 30 minutes daily and/or when sitting, a frequency of: - throughout the day Avoid standing for long periods of time. Exercise regularly Off-Loading: Open toe surgical shoe to: - with felt padding to offload Additional Orders / Instructions: Stop/Decrease Smoking Follow Nutritious Diet Home Health: No change in wound care orders this week; continue Home Health for wound care. May utilize formulary equivalent dressing for wound treatment orders unless otherwise specified. Other Home Health Orders/Instructions: - CNOBSJG WOUND #16: - Amputation Site - Transmetatarsal Wound Laterality: Plantar, Right Cleanser: Soap and Water 2 x Per Week/15 Days Discharge Instructions: May shower and wash wound with dial antibacterial soap and water prior to dressing change. Cleanser: Wound  Cleanser (Home Health) 2 x Per Week/15 Days Discharge Instructions: Cleanse the wound with wound cleanser or normal saline prior to applying a clean dressing using gauze sponges, not tissue or cotton balls. Peri-Wound Care: Sween Lotion (Moisturizing lotion) (Home Health) 2 x Per Week/15 Days Discharge Instructions: Apply moisturizing lotion as directed Prim Dressing: KerraCel Ag Gelling Fiber Dressing, 2x2 in (silver alginate) (Home Health) 2 x Per Week/15 Days ary Discharge Instructions: Apply silver alginate to wound bed as instructed Prim Dressing: Santyl Ointment 2 x Per Week/15 Days ary Discharge Instructions: Apply in clinic only, under alginate Secondary Dressing: Woven Gauze Sponge, Non-Sterile 4x4 in (Home Health) 2 x Per Week/15 Days Discharge Instructions: Apply over primary dressing as directed. Secondary Dressing: Optifoam Non-Adhesive Dressing, 4x4 in (Home Health) 2 x Per Week/15 Days Discharge Instructions: Foam donut to help offload Com pression Wrap: Kerlix Roll 4.5x3.1 (in/yd) (Home Health) 2 x Per Week/15 Days Discharge Instructions: Apply Kerlix and Coban compression as directed. Com pression Wrap: Coban Self-Adherent Wrap 4x5 (in/yd) (Home Health) 2 x Per Week/15 Days Discharge Instructions: Apply over Kerlix as directed. WOUND #20: - Lower Leg Wound Laterality: Left, Anterior Cleanser: Soap and Water 2 x Per Week/15 Days Discharge Instructions: May shower and wash wound with  dial antibacterial soap and water prior to dressing change. Cleanser: Wound Cleanser (Home Health) 2 x Per Week/15 Days Discharge Instructions: Cleanse the wound with wound cleanser or normal saline prior to applying a clean dressing using gauze sponges, not tissue or cotton balls. Peri-Wound Care: Sween Lotion (Moisturizing lotion) (Home Health) 2 x Per Week/15 Days Discharge Instructions: Apply moisturizing lotion as directed Prim Dressing: KerraCel Ag Gelling Fiber Dressing, 2x2 in (silver  alginate) (Home Health) 2 x Per Week/15 Days ary Discharge Instructions: Apply silver alginate to wound bed as instructed Secondary Dressing: Woven Gauze Sponge, Non-Sterile 4x4 in (Home Health) 2 x Per Week/15 Days Discharge Instructions: Apply over primary dressing as directed. Secondary Dressing: ABD Pad, 5x9 (Home Health) 2 x Per Week/15 Days Discharge Instructions: Apply over primary dressing as directed. Com pression Wrap: Kerlix Roll 4.5x3.1 (in/yd) (Home Health) 2 x Per Week/15 Days Discharge Instructions: Apply Kerlix and Coban compression as directed. Com pression Wrap: Coban Self-Adherent Wrap 4x5 (in/yd) (Home Health) 2 x Per Week/15 Days Discharge Instructions: Apply over Kerlix as directed. 1. In office sharp debridement 2. Santyl under Kerlix/Coban 3. Follow-up in 3 weeks Electronic Signature(s) Signed: 06/18/2021 12:14:15 PM By: [name] DO Entered By: [name] on 06/18/2021 12:13:16 -------------------------------------------------------------------------------- HxROS Details Patient Name: Date of Service: Karla Corwin, [name] 06/18/2021 10:30 A M Medical Record Number: [id number] Patient Account Number: [id number] Date of Birth/Sex: Treating RN: 1961/01/28 (60 y.o. [name] Primary Care Provider: [facility], [facility] Other Clinician: Referring Provider: Treating Provider/Extender: Jannette Spanner, Jairo Ben in Treatment: 17 Information Obtained From Patient Constitutional Symptoms (General Health) Medical History: Past Medical History Notes: alcohol abuse , cocaine abuse , marijuana abuse , narcotic abuse , noncompliance , obesity , tobacco abuse , insomnia , Eyes Medical History: Negative for: Cataracts; Glaucoma; Optic Neuritis Ear/Nose/Mouth/Throat Medical History: Negative for: Chronic sinus problems/congestion; Middle ear problems Hematologic/Lymphatic Medical History: Negative for: Anemia; Hemophilia; Human Immunodeficiency  Virus; Lymphedema; Sickle Cell Disease Past Medical History Notes: B12 deficiency Respiratory Medical History: Positive for: Chronic Obstructive Pulmonary Disease (COPD); Sleep Apnea Negative for: Aspiration; Asthma; Pneumothorax; Tuberculosis Past Medical History Notes: wheezing , Cardiovascular Medical History: Positive for: Arrhythmia - NSVT , PAF , PAT Congestive Heart Failure - chronic combined; Coronary Artery Disease; Hypertension; Peripheral Arterial ; Disease Negative for: Angina; Deep Vein Thrombosis; Hypotension; Myocardial Infarction; Peripheral Venous Disease; Phlebitis; Vasculitis Past Medical History Notes: cardiomyopathy , critical lower limb ischemia , elevated troponin , hyperlipidemia , hypokalemia , hypomagnesiumia , symptomatic bradycardia , carotid arterial disease ,demand ischemia , Gastrointestinal Medical History: Positive for: Cirrhosis - alcoholic Negative for: Colitis; Crohns; Hepatitis A; Hepatitis B; Hepatitis C Past Medical History Notes: GERD Endocrine Medical History: Positive for: Type II Diabetes Negative for: Type I Diabetes Past Medical History Notes: hypothyroidism Time with diabetes: 5 years Treated with: Insulin Blood sugar tested every day: Yes Tested : 3x a day Genitourinary Medical History: Negative for: End Stage Renal Disease Past Medical History Notes: renal insufficiency , CKD Stage 3 , Immunological Medical History: Negative for: Lupus Erythematosus; Raynauds; Scleroderma Integumentary (Skin) Medical History: Negative for: History of Burn Past Medical History Notes: cellulitis Musculoskeletal Medical History: Positive for: Osteoarthritis Negative for: Gout; Rheumatoid Arthritis; Osteomyelitis Neurologic Medical History: Positive for: Neuropathy - dm2 peripheral Negative for: Dementia; Quadriplegia; Paraplegia; Seizure Disorder Oncologic Medical History: Negative for: Received Chemotherapy; Received  Radiation Psychiatric Medical History: Negative for: Anorexia/bulimia; Confinement Anxiety Past Medical History Notes: anxiety Immunizations Pneumococcal Vaccine: Received Pneumococcal Vaccination: Yes Received Pneumococcal  Vaccination On or After 60th Birthday: No Tetanus Vaccine: Last tetanus shot: 10/14/1981 Implantable Devices None Hospitalization / Surgery History Type of Hospitalization/Surgery balloon angioplasty cardiac cath X2 cardioversionX2 carotid angiogram DandC uterus endarcdectomy (L) (L) heart cath lower ext angiogram X2 peripheral athrectomy 05/03/2021-05/05/2021 inpatient cone-fall CKD Family and Social History Cancer: Yes - Mother; Diabetes: Yes - Mother; Heart Disease: Yes - Mother,Father; Hereditary Spherocytosis: No; Hypertension: Yes - Mother,Father; Kidney Disease: No; Lung Disease: Yes - Siblings; Seizures: No; Stroke: Yes - Mother; Thyroid Problems: Yes - Mother; Tuberculosis: No; Former smoker; Marital Status - Divorced; Alcohol Use: Never; Drug Use: Current History - marijuana; Caffeine Use: Daily - Coffee; Financial Concerns: No; Food, Clothing or Shelter Needs: No; Support System Lacking: No; Transportation Concerns: No Electronic Signature(s) Signed: 06/18/2021 12:14:15 PM By: [name] DO Signed: 06/18/2021 1:36:18 PM By: Lorrin Jackson Entered By: [name] on 06/18/2021 12:09:55 -------------------------------------------------------------------------------- SuperBill Details Patient Name: Date of Service: Karla Corwin, [name] 06/18/2021 Medical Record Number: [id number] Patient Account Number: [id number] Date of Birth/Sex: Treating RN: [birth year] (60 y.o. [name] Primary Care Provider: [place] Other Clinician: Referring Provider: Treating Provider/Extender: [name] [facility], [name] in Treatment: 17 Diagnosis Coding ICD-10 Codes Code Description 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 2 diabetes mellitus with foot  ulcer S91.301D Unspecified open wound, right foot, subsequent encounter E11.51 Type 2 diabetes mellitus with diabetic peripheral angiopathy without gangrene S81.802A Unspecified open wound, left lower leg, initial encounter I87.2 Venous insufficiency (chronic) (peripheral) Facility Procedures CPT4 Code: 82423536 Description: 14431 - WOUND CARE VISIT-LEV 3 EST PT Modifier: 25 Quantity: 1 CPT4 Code: [id number] Description: 19509 - DEB SUBQ TISSUE 20 SQ CM/< ICD-10 Diagnosis Description 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 2 diabetes mellitus with foot ulcer Modifier: Quantity: 1 Physician Procedures : CPT4 Code Description Modifier [id number] - WC PHYS LEVEL 3 - EST PT ICD-10 Diagnosis Description S81.802A Unspecified open wound, left lower leg, initial encounter I87.2 Venous insufficiency (chronic) (peripheral) Quantity: 1 : [id number] - WC PHYS SUBQ TISS 20 SQ CM ICD-10 Diagnosis Description 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 2 diabetes mellitus with foot ulcer Quantity: 1 Electronic Signature(s) Signed: 06/18/2021 12:35:09 PM By: Levan Hurst RN, BSN Signed: 06/18/2021 12:48:58 PM By: [name] DO Previous Signature: 06/18/2021 12:14:15 PM Version By: [name] DO Entered By: Levan Hurst on 06/18/2021 12:33:49

## 2021-06-22 ENCOUNTER — Ambulatory Visit (HOSPITAL_COMMUNITY)
Admit: 2021-06-22 | Discharge: 2021-06-22 | Disposition: A | Discharge status: Home / Self Care | Payer: Medicare (Managed Care) | Attending: Cardiology | Admitting: Cardiology

## 2021-06-22 ENCOUNTER — Other Ambulatory Visit: Payer: Self-pay

## 2021-06-22 ENCOUNTER — Other Ambulatory Visit (HOSPITAL_COMMUNITY): Payer: Self-pay | Admitting: Cardiovascular Disease

## 2021-06-22 DIAGNOSIS — I739 Peripheral vascular disease, unspecified: Secondary | ICD-10-CM | POA: Diagnosis not present

## 2021-06-22 DIAGNOSIS — I70229 Atherosclerosis of native arteries of extremities with rest pain, unspecified extremity: Secondary | ICD-10-CM

## 2021-06-22 DIAGNOSIS — L97519 Non-pressure chronic ulcer of other part of right foot with unspecified severity: Secondary | ICD-10-CM

## 2021-06-24 ENCOUNTER — Other Ambulatory Visit (HOSPITAL_COMMUNITY): Payer: Self-pay

## 2021-06-24 NOTE — Progress Notes (Signed)
Paramedicine Encounter    Patient ID: [name], female    DOB: 1961-06-05, 60 y.o.   MRN: [id number]   Patient Care Team: [facility] as PCP - General Gwenlyn Found, [name], MD as PCP - Cardiology (Cardiology) [name], MD as PCP - Electrophysiology (Cardiology) [name], MD as Consulting Physician (Cardiology) [name], MD as Consulting Physician (Pulmonary Disease) System, Provider Not In  Patient Active Problem List   Diagnosis Date Noted   Bradycardia 06/13/2021   Uncontrolled type 2 diabetes mellitus with hyperglycemia ([place]) 06/13/2021   Acute kidney injury superimposed on CKD ([place]) 06/13/2021   Critical ischemia of foot ([place]) 06/07/2021   Fall 05/05/2021   PAF (paroxysmal atrial fibrillation) ([place]) 05/05/2021   COPD (chronic obstructive pulmonary disease) ([place]) 05/05/2021   DM (diabetes mellitus), type 2 with complications ([place]) 99991111   PAD (peripheral artery disease) ([place]) 05/05/2021   Cellulitis 05/05/2021   Acute on chronic combined systolic and diastolic CHF (congestive heart failure) ([place]) 05/05/2021   OSA (obstructive sleep apnea) 05/05/2021   CKD (chronic kidney disease) stage 3, GFR 30-59 ml/min ([place]) 05/05/2021   AKI (acute kidney injury) ([place]) 05/04/2021   Pressure injury of skin 05/04/2021   Ulcer of foot, unspecified laterality, limited to breakdown of skin ([place]) 02/17/2021   Altered mental status 01/02/2021   AF (paroxysmal atrial fibrillation) ([place]) 01/02/2021   CAD (coronary artery disease) 01/02/2021   PVD (peripheral vascular disease) ([place]) 01/02/2021   Acute renal failure superimposed on stage 4 chronic kidney disease ([place]) 01/02/2021   Diabetes mellitus type 2, uncontrolled, with complications ([place]) [id number]   Diabetic nephropathy ([place]) 01/02/2021   Low back pain 06/01/2020   Hyperlipidemia 04/03/2020   Muscle weakness 03/20/2020   Closed fracture of neck of right radius 03/13/2020   Pain in elbow  03/12/2020   PAD (peripheral artery disease) ([place]) 12/26/2019   Acute renal failure ([name])    Acute respiratory failure with hypoxia ([place]) 12/02/2019   Acute exacerbation of CHF (congestive heart failure) ([place]) 12/01/2019   Cellulitis in diabetic foot ([place]) 07/08/2019   Osteomyelitis ([place]) 06/21/2019   NICM (nonischemic cardiomyopathy) ([place]) 06/20/2019   Non-healing ulcer ([place]) 06/20/2019   Acute CHF (congestive heart failure) ([place]) 11/26/2018   CHF (congestive heart failure) ([place]) 11/26/2018   Acute respiratory failure ([place]) 10/21/2018   AKI (acute kidney injury) ([place]) 08/18/2018   Coagulation disorder ([place]) 08/09/2017   Depression 07/21/2017   At risk for adverse drug reaction 06/20/2017   Peripheral neuropathy 06/20/2017   Acute osteomyelitis of right foot ([place]) 06/13/2017   S/P transmetatarsal amputation of foot, right ([place]) 06/05/2017   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 ([place]) 05/19/2017   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[place])    SIRS (systemic inflammatory response syndrome) ([place]) 04/06/2017   CKD (chronic kidney disease), stage III ([place]) 11/24/2016   Suspected sleep apnea 11/24/2016   Ulcer of toe of right foot, with necrosis of bone ([place]) 10/27/2016   Ulcer of left lower leg ([place]) 05/19/2016   Severe obesity (BMI >= 40) ([place]) 02/24/2016   COPD GOLD 0  02/24/2016   Morbid obesity ([place]) 02/24/2016   Encounter for therapeutic drug monitoring 02/10/2016   Symptomatic bradycardia 01/12/2016   Essential hypertension 12/22/2015   Chronic combined systolic and diastolic CHF (congestive heart failure) ([place])    [place]    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 11/08/2015   Tobacco abuse 10/23/2015   Coronary artery disease  DOE (dyspnea on exertion) 04/29/2015   Diabetes mellitus type 2, uncontrolled ([place]) 02/08/2015   Influenza A 02/07/2015   Wrist fracture, left, with routine healing, subsequent encounter 02/05/2015   Wrist fracture,  left, closed, initial encounter 01/29/2015   PAF (paroxysmal atrial fibrillation) ([place]) 01/16/2015   Carotid arterial disease ([place]) 01/16/2015   Claudication ([place]) 01/15/2015   Demand ischemia ([place]) 10/29/2014   Insomnia 02/03/2014   COPD with acute exacerbation ([place]) 02/01/2014   S/P peripheral artery angioplasty - [place]bo[place]awk atherectomy; R SFA 09/11/2013    Class: Acute   Leg pain, bilateral 08/19/2013   Hypothyroidism 07/31/2013   Cellulitis 06/13/2013   History of cocaine abuse ([place]) 06/13/2013   Long term current use of anticoagulant therapy 05/20/2013   Alcohol abuse    Narcotic abuse ([place])    Marijuana abuse    Alcoholic cirrhosis ([place])    DM (diabetes mellitus), type 2 with peripheral vascular complications ([name])     Current Outpatient Medications:    ACCU-CHEK GUIDE test strip, USE TO CHECK BLOOD SUGAR FOUR TIMES DAILY, Disp: , Rfl:    acetaminophen (TYLENOL) 500 MG tablet, Take 500 mg by mouth every 6 (six) hours as needed for moderate pain., Disp: , Rfl:    albuterol (VENTOLIN HFA) 108 (90 Base) MCG/ACT inhaler, Inhale 2 puffs into the lungs every 6 (six) hours as needed for wheezing or shortness of breath., Disp: 18 g, Rfl: 0   amiodarone (PACERONE) 200 MG tablet, Take 0.5 tablets (100 mg total) by mouth daily., Disp: 45 tablet, Rfl: 3   amLODipine (NORVASC) 10 MG tablet, Take 1 tablet (10 mg total) by mouth daily., Disp: 90 tablet, Rfl: 3   apixaban (ELIQUIS) 5 MG TABS tablet, Take 5 mg by mouth 2 (two) times daily., Disp: , Rfl:    atorvastatin (LIPITOR) 40 MG tablet, Take 40 mg by mouth every evening., Disp: , Rfl:    budesonide-formoterol (SYMBICORT) 160-4.5 MCG/ACT inhaler, Inhale 2 puffs into the lungs 2 (two) times daily., Disp: 1 Inhaler, Rfl: 2   clopidogrel (PLAVIX) 75 MG tablet, Take 1 tablet (75 mg total) by mouth daily with breakfast., Disp: 90 tablet, Rfl: 1   Dulaglutide (TRULICITY) A999333 [id number] SOPN, Inject 0.75 mg into the skin every Friday., Disp: , Rfl:     empagliflozin (JARDIANCE) 10 MG TABS tablet, Take 1 tablet (10 mg total) by mouth daily before breakfast., Disp: 90 tablet, Rfl: 3   FLUoxetine (PROZAC) 10 MG capsule, Take 1 capsule (10 mg total) by mouth at bedtime., Disp: 30 capsule, Rfl: 0   folic acid (FOLVITE) 1 MG tablet, Take 1 mg by mouth daily., Disp: , Rfl:    hydrALAZINE (APRESOLINE) 25 MG tablet, Take 1 tablet (25 mg total) by mouth 3 (three) times daily., Disp: 90 tablet, Rfl: 11   insulin glargine (LANTUS) 100 UNIT/ML Solostar Pen, INJECT 20 UNITS INTO THE SKIN DAILY., Disp: 15 mL, Rfl: 0   insulin lispro (HUMALOG) 100 UNIT/ML KwikPen, Inject 8 Units into the skin 3 (three) times daily with meals., Disp: 15 mL, Rfl: 0   Insulin Pen Needle 32G X 4 MM MISC, USE AS DIRECTED WITH INSULIN PENS, Disp: 100 each, Rfl: 0   levothyroxine (SYNTHROID, LEVOTHROID) 50 MCG tablet, Take 1 tablet (50 mcg total) by mouth daily., Disp: 30 tablet, Rfl: 0   losartan (COZAAR) 100 MG tablet, Take 1 tablet (100 mg total) by mouth daily., Disp: 30 tablet, Rfl: 6   nortriptyline (PAMELOR) 50 MG capsule, Take 50 mg by  mouth 2 (two) times daily., Disp: , Rfl:    Omega-3 Fatty Acids (FISH OIL PO), Take 1 capsule by mouth daily., Disp: , Rfl:    pantoprazole (PROTONIX) 40 MG tablet, Take 1 tablet (40 mg total) by mouth daily., Disp:  , Rfl:    torsemide (DEMADEX) 20 MG tablet, Take 4 tablets (80 mg total) by mouth every morning AND 3 tablets (60 mg total) every evening., Disp: 210 tablet, Rfl: 11   VITAMIN D PO, Take 1 tablet by mouth daily., Disp: , Rfl:    colchicine 0.6 MG tablet, Take 0.6 mg by mouth daily. (Patient not taking: No sig reported), Disp: , Rfl:    spironolactone (ALDACTONE) 25 MG tablet, Take 1 tablet (25 mg total) by mouth daily. (Patient not taking: No sig reported), Disp: 30 tablet, Rfl: 11 Allergies  Allergen Reactions   Gabapentin Nausea And Vomiting and Other (See Comments)    POSSIBLE SHAKING   Lyrica [Pregabalin] Other (See  Comments)    Shaking       Social History   Socioeconomic History   Marital status: Single    Spouse name: Not on file   Number of children: 1   Years of education: 12   Highest education level: 12th grade  Occupational History   Occupation: disabled  Tobacco Use   Smoking status: Every Day    Packs/day: 1.00    Years: 44.00    Pack years: 44.00    Types: E-cigarettes, Cigarettes   Smokeless tobacco: Former    Types: Snuff  Vaping Use   Vaping Use: Former   Devices: 11/26/2018 "stopped months ago"  Substance and Sexual Activity   Alcohol use: Yes    Comment: occ   Drug use: Yes    Types: "Crack" cocaine, Marijuana, Oxycodone   Sexual activity: Not Currently  Other Topics Concern   Not on file  Social History Narrative   ** Merged History Encounter **       Lives in [place], in motel with sister.  They are looking to move but don't have a place to go yet.     Social Determinants of Health   Financial Resource Strain: Low Risk    Difficulty of Paying Living Expenses: Not very hard  Food Insecurity: Not on file  Transportation Needs: Not on file  Physical Activity: Not on file  Stress: Not on file  Social Connections: Not on file  Intimate Partner Violence: Not on file    Physical Exam      Future Appointments  Date Time Provider [place]  06/25/2021  9:00 AM [name], MD [facility]  07/01/2021 10:30 AM Ricard Dillon, MD [facility]  07/06/2021 10:15 AM Ricard Dillon, MD [facility]  07/13/2021  2:30 PM [facility] PA/NP [facility] None  07/14/2021  9:30 AM Charlott Rakes, MD [facility] None    BP 140/72   Pulse 78   Resp 18   Wt 231 lb (104.8 kg)   LMP  (LMP Unknown)   SpO2 95%   BMI 39.65 kg/m   Weight yesterday-? Last visit weight-227  Pt reports she is doing ok, she had to call 911 over the wknd due to her cbg dropping then it ran up high.  She needs better closer monitoring so we got her set up with PCP back  to [facility] providers.  Her libre freestyle meter is showing error code. So we called customer service -she is able to get new meter-they are going  to send it.  She denies any more issues, her cbgs still up and down.  No falls.  She is going out of town next [employer] I filled up 2 pill boxes for her.  Per 8/2 she is stay off spiro and bisoprolol but keep on losartan.  She isnt sure of her weight from yesterday. Will f/u once she returns.   Marylouise Stacks, [place] [facility] Paramedic  06/24/21

## 2021-06-25 ENCOUNTER — Encounter: Payer: Self-pay | Admitting: Cardiovascular Disease

## 2021-06-25 ENCOUNTER — Ambulatory Visit (INDEPENDENT_AMBULATORY_CARE_PROVIDER_SITE_OTHER): Payer: Medicare (Managed Care) | Admitting: Cardiovascular Disease

## 2021-06-25 ENCOUNTER — Other Ambulatory Visit: Payer: Self-pay

## 2021-06-25 DIAGNOSIS — I70229 Atherosclerosis of native arteries of extremities with rest pain, unspecified extremity: Secondary | ICD-10-CM

## 2021-06-25 NOTE — Assessment & Plan Note (Signed)
[name] returns today for follow-up after her recent endovascular procedure which I performed 06/07/2021 because of a wound on the dorsal aspect of her right foot which is already had a TMA.  She has had prior left femoropopliteal bypass grafting for critical limb ischemia as well.  She had an occluded right SFA over a fairly long distance with an occluded popliteal artery and tibial vessels as well.  There are no named vessels below the knee.  I performed directional atherectomy followed by drug-coated balloon angioplasty revascularizing the SFA hopefully providing better collateral flow.  She says that her foot feels better.  She is seeing the wound center on a regular basis.  I do not know that she has additional endovascular options for below the knee revascularization.  I will see her back in 3 months for follow-up.  We talked about the importance of smoking cessation.  We will repeat lower extremity arterial Doppler studies in 6 months.

## 2021-06-25 NOTE — Patient Instructions (Signed)
Medication Instructions:  Your physician recommends that you continue on your current medications as directed. Please refer to the Current Medication list given to you today.  *If you need a refill on your cardiac medications before your next appointment, please call your pharmacy*  Testing/Procedures: Your physician has requested that you have a lower extremity arterial duplex in 6 MONTHS. This test is an ultrasound of the arteries in the legs. It looks at arterial blood flow in the legs. Allow one hour for Lower Arterial scans. There are no restrictions or special instructions  Follow-Up: At [facility], you and your health needs are our priority.  As part of our continuing mission to provide you with exceptional heart care, we have created designated Provider Care Teams.  These Care Teams include your primary Cardiologist (physician) and Advanced Practice Providers (APPs -  Physician Assistants and Nurse Practitioners) who all work together to provide you with the care you need, when you need it.  We recommend signing up for the patient portal called "MyChart".  Sign up information is provided on this After Visit Summary.  MyChart is used to connect with patients for Virtual Visits (Telemedicine).  Patients are able to view lab/test results, encounter notes, upcoming appointments, etc.  Non-urgent messages can be sent to your provider as well.   To learn more about what you can do with MyChart, go to [email or web address].    Your next appointment:   3 month(s)  The format for your next appointment:   In Person  Provider:   Quay Burow, MD

## 2021-06-25 NOTE — Progress Notes (Signed)
06/25/2021 [name]   11/21/1960  FS:[id number]  [place], [place] Primary Cardiologist: [name] MD FACP, [place], [place], [place]  HPI:  [name] is a 60 y.o.  moderately overweight single Caucasian female mother of one child who lives with her sister. She was originally referred by Dr. Franki Monte at [facility] for peripheral vascular evaluation because of critical limb ischemia. I last saw her in the office 04/03/2020.[name] She sees Dr. Jenkins Rouge at [facility] for cardiomyopathy and paroxysmal atrial fibrillation. Her cardiac risk factors include a 20-pack-year history of tobacco abuse (she has stopped smoking), treated diabetes, and hypertension as well as family history of heart disease. She's never had a heart attack or stroke. She does have COPD. She had paroxysmal atrial fibrillation in the past and has undergone cardioversion in [place].. The lower extremity arterial Doppler studies performed in our office suggested critical limb ischemia with an occluded SFA and popliteal arteries bilaterally with tibial vessel disease as well. She complains of lifestyle limiting claudication. I performed lower extremity angiography on 09/10/13 revealing occluded SFAs bilaterally with chronic occluded tibial vessels as well. I performed [employer]  directional atherectomy of her entire right SFA with an excellent angiographic result removing a copious amount of atherosclerotic plaque. The ulcer on her right heel ultimately healed. She does continue to smoke one pack per day. She was recently admitted to [facility] because of atrial fibrillation with rapid ventricular response. She underwent cardiac catheterization on 10/31/14 revealing an ejection fraction of 30-35% with disease that was ultimately treated medically. Her carotid Doppler showed high-grade bilateral ICA disease and lower extremity Dopplers revealed restenosis within the right SFA with a ABI  of 0.34. She is symptomatic on that side. I performed angiography on her 01/15/15 revealing an occluded right internal carotid artery, 95% proximal left internal carotid artery stenosis, long 99% stenosis within the right SFA and an occluded left SFA. I performed [facility] 1  directional atherectomy and drug-eluting balloon angioplasty of her right SFA with an excellent  Angiographic and clinical result. She underwent left carotid endarterectomy by Dr. Trula Slade on 02/19/15 which was uncomplicated. Since I saw her 6 months ago she's been remaining clinically stable. She denies chest pain or shortness of breath. She continues to have claudication.Lower extremity Doppler studies performed 01/23/15 revealed ABIs in the 0.6 range bilaterally with a patent right SFA. She continues to complain of claudication however.recent Dopplers performed 12/22/15 revealed a right TBI 0. 17. She was admitted to the hospital with altered mental status and sepsis 04/06/17 for one week. 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 I suspect performed angiography of her 04/13/17 revealing an occluded right SFA was 0 vessel runoff. Atherectomy I's are entire right SFA and she subsequently underwent right second ray resection by Dr. Sharol Given to on 04/28/17. She has stopped smoking since that hospitalization. She also underwent a right transmetatarsal amputation by Dr. Sharol Given which has subsequently healed. Recent lower extremity Dopplers performed 05/29/17 revealed a right ABI 0.65 with a patent right SFA and a left ABI 0.31.     She does have diastolic heart failure followed by Dr. Aundra Dubin in the heart failure clinic.  She has had a CardioMEMS device implanted to follow her pulmonary pressures and lung impedance..  She does admit to dietary indiscretion.  She was diuresed 30 pounds.  She is aware of salt restriction.  She is on oral diuretics and  weighs herself on a daily basis.  She denies claudication but does have pain in her feet suggestive of  diabetic peripheral neuropathy.   She was admitted to the hospital in August of last year with an infected diabetic left foot ulcer and ultimately underwent left fem-tib bypass grafting by Dr. Sherren Mocha early 06/28/2019 which he follows by duplex ultrasound on outpatient basis.  She unfortunately is gone back to smoking half a pack per day.  She denies chest pain or shortness of breath.  She is aware of salt restriction.  She was seen in our office by Caron Presume, PA-C with a nonhealing wound on her right heel.  I performed lower extremity angiography and intervention 06/07/2021 revealing a patent left femoropopliteal bypass graft, and occluded right SFA, popliteal and tibial vessels.  I was able to revascularize a long right SFA CTO with directional atherectomy and drug-coated balloon angioplasty.  Her Dopplers showed improvement in her right SFA although her right ABI continues to be severely depressed at 0.46 because her popliteal and tibial vessel occlusion.  I do not think she has additional endovascular opportunities for inline flow.  She is seeing wound care center.  We talked about the importance of smoking cessation.  Current Meds  Medication Sig   ACCU-CHEK GUIDE test strip USE TO CHECK BLOOD SUGAR FOUR TIMES DAILY   acetaminophen (TYLENOL) 500 MG tablet Take 500 mg by mouth every 6 (six) hours as needed for moderate pain.   albuterol (VENTOLIN HFA) 108 (90 Base) MCG/ACT inhaler Inhale 2 puffs into the lungs every 6 (six) hours as needed for wheezing or shortness of breath.   amiodarone (PACERONE) 200 MG tablet Take 0.5 tablets (100 mg total) by mouth daily.   amLODipine (NORVASC) 10 MG tablet Take 1 tablet (10 mg total) by mouth daily.   apixaban (ELIQUIS) 5 MG TABS tablet Take 5 mg by mouth 2 (two) times daily.   atorvastatin (LIPITOR) 40 MG tablet Take 40 mg by mouth every evening.   budesonide-formoterol (SYMBICORT) 160-4.5 MCG/ACT inhaler Inhale 2 puffs into the lungs 2 (two) times daily.    clopidogrel (PLAVIX) 75 MG tablet Take 1 tablet (75 mg total) by mouth daily with breakfast.   colchicine 0.6 MG tablet Take 0.6 mg by mouth daily.   Continuous Blood Gluc Sensor (FREESTYLE LIBRE 2 SENSOR) MISC USE 1 (ONE) EACH EVERY 2 WEEKS   Dulaglutide (TRULICITY) A999333 0000000 SOPN Inject 0.75 mg into the skin every Friday.   empagliflozin (JARDIANCE) 10 MG TABS tablet Take 1 tablet (10 mg total) by mouth daily before breakfast.   FLUoxetine (PROZAC) 10 MG capsule Take 1 capsule (10 mg total) by mouth at bedtime.   folic acid (FOLVITE) 1 MG tablet Take 1 mg by mouth daily.   hydrALAZINE (APRESOLINE) 25 MG tablet Take 1 tablet (25 mg total) by mouth 3 (three) times daily.   insulin glargine (LANTUS) 100 UNIT/ML Solostar Pen INJECT 20 UNITS INTO THE SKIN DAILY.   insulin lispro (HUMALOG) 100 UNIT/ML KwikPen Inject 8 Units into the skin 3 (three) times daily with meals.   Insulin Pen Needle 32G X 4 MM MISC USE AS DIRECTED WITH INSULIN PENS   levothyroxine (SYNTHROID, LEVOTHROID) 50 MCG tablet Take 1 tablet (50 mcg total) by mouth daily.   losartan (COZAAR) 100 MG tablet Take 1 tablet (100 mg total) by mouth daily.   nortriptyline (PAMELOR) 50 MG capsule Take 50 mg by mouth 2 (two) times daily.   Omega-3 Fatty Acids (FISH OIL  PO) Take 1 capsule by mouth daily.   pantoprazole (PROTONIX) 40 MG tablet Take 1 tablet (40 mg total) by mouth daily.   spironolactone (ALDACTONE) 25 MG tablet Take 1 tablet (25 mg total) by mouth daily.   torsemide (DEMADEX) 20 MG tablet Take 4 tablets (80 mg total) by mouth every morning AND 3 tablets (60 mg total) every evening.   VITAMIN D PO Take 1 tablet by mouth daily.     Allergies  Allergen Reactions   Gabapentin Nausea And Vomiting and Other (See Comments)    POSSIBLE SHAKING   Lyrica [Pregabalin] Other (See Comments)    Shaking     Social History   Socioeconomic History   Marital status: Single    Spouse name: Not on file   Number of children: 1    Years of education: 12   Highest education level: 12th grade  Occupational History   Occupation: disabled  Tobacco Use   Smoking status: Every Day    Packs/day: 1.00    Years: 44.00    Pack years: 44.00    Types: E-cigarettes, Cigarettes   Smokeless tobacco: Former    Types: Snuff  Vaping Use   Vaping Use: Former   Devices: 11/26/2018 "stopped months ago"  Substance and Sexual Activity   Alcohol use: Yes    Comment: occ   Drug use: Yes    Types: "Crack" cocaine, Marijuana, Oxycodone   Sexual activity: Not Currently  Other Topics Concern   Not on file  Social History Narrative   ** Merged History Encounter **       Lives in [place], in motel with sister.  They are looking to move but don't have a place to go yet.     Social Determinants of Health   Financial Resource Strain: Low Risk    Difficulty of Paying Living Expenses: Not very hard  Food Insecurity: Not on file  Transportation Needs: Not on file  Physical Activity: Not on file  Stress: Not on file  Social Connections: Not on file  Intimate Partner Violence: Not on file     Review of Systems: General: negative for chills, fever, night sweats or weight changes.  Cardiovascular: negative for chest pain, dyspnea on exertion, edema, orthopnea, palpitations, paroxysmal nocturnal dyspnea or shortness of breath Dermatological: negative for rash Respiratory: negative for cough or wheezing Urologic: negative for hematuria Abdominal: negative for nausea, vomiting, diarrhea, bright red blood per rectum, melena, or hematemesis Neurologic: negative for visual changes, syncope, or dizziness All other systems reviewed and are otherwise negative except as noted above.    Blood pressure 136/71, pulse 75, height '5\' 4"'$  (1.626 m), weight 232 lb 9.6 oz (105.5 kg), SpO2 96 %.  General appearance: alert and no distress Neck: no adenopathy, no carotid bruit, no JVD, supple, symmetrical, trachea midline, and thyroid not enlarged,  symmetric, no tenderness/mass/nodules Lungs: clear to auscultation bilaterally Heart: regular rate and rhythm, S1, S2 normal, no murmur, click, rub or gallop Extremities: extremities normal, atraumatic, no cyanosis or edema Pulses: Absent pedal pulses Skin: Wound dorsal aspect of right foot at the metatarsal head. Neurologic: Grossly normal  EKG not performed today  ASSESSMENT AND PLAN:   Critical ischemia of foot ([place]) Ms. Grismore returns today for follow-up after her recent endovascular procedure which I performed 06/07/2021 because of a wound on the dorsal aspect of her right foot which is already had a TMA.  She has had prior left femoropopliteal bypass grafting for critical limb ischemia as  well.  She had an occluded right SFA over a fairly long distance with an occluded popliteal artery and tibial vessels as well.  There are no named vessels below the knee.  I performed directional atherectomy followed by drug-coated balloon angioplasty revascularizing the SFA hopefully providing better collateral flow.  She says that her foot feels better.  She is seeing the wound center on a regular basis.  I do not know that she has additional endovascular options for below the knee revascularization.  I will see her back in 3 months for follow-up.  We talked about the importance of smoking cessation.  We will repeat lower extremity arterial Doppler studies in 6 months.     [name] MD FACP,FACC,FAHA, [facility] 06/25/2021 9:42 AM

## 2021-06-29 ENCOUNTER — Other Ambulatory Visit (HOSPITAL_COMMUNITY): Payer: Self-pay

## 2021-06-29 ENCOUNTER — Ambulatory Visit: Payer: Medicare (Managed Care) | Admitting: Family Medicine

## 2021-06-29 NOTE — Progress Notes (Signed)
Pt is out of town this week.  No home visit to be made this week. I filled up 2 wks of pill boxes for her last visit and will f/u next week.   Marylouise Stacks, EMT-Paramedic  06/29/21

## 2021-07-01 ENCOUNTER — Encounter (HOSPITAL_BASED_OUTPATIENT_CLINIC_OR_DEPARTMENT_OTHER): Payer: Medicare (Managed Care) | Admitting: Internal Medicine

## 2021-07-01 NOTE — Progress Notes (Signed)
LADEJA, FORTNA ([id number]) Visit Report for 06/18/2021 Arrival Information Details Patient Name: Date of Service: [name] 06/18/2021 10:30 A M Medical Record Number: [id number] Patient Account Number: [id number] Date of Birth/Sex: Treating RN: 08-02-1961 (60 y.o. [name] Primary Care Jacari Iannello: [place] [place] Other Clinician: Referring Joella Saefong: Treating Roberta Angell/Extender: [name], Jairo Ben in Treatment: 17 Visit Information History Since Last Visit Added or deleted any medications: No Patient Arrived: Wheel Chair Any new allergies or adverse reactions: No Arrival Time: 10:56 Had a fall or experienced change in Yes Accompanied By: self activities of daily living that may affect Transfer Assistance: None risk of falls: Patient Identification Verified: Yes Signs or symptoms of abuse/neglect since last visito No Secondary Verification Process Completed: Yes Hospitalized since last visit: Yes Patient Requires Transmission-Based Precautions: No Implantable device outside of the clinic excluding No Patient Has Alerts: No cellular tissue based products placed in the center since last visit: Has Dressing in Place as Prescribed: Yes Pain Present Now: Yes Electronic Signature(s) Signed: 06/29/2021 11:33:27 AM By: Sandre Kitty Entered By: Sandre Kitty on 06/18/2021 10:57:41 -------------------------------------------------------------------------------- Clinic Level of Care Assessment Details Patient Name: Date of Service: [name] 06/18/2021 10:30 A M Medical Record Number: [id number] Patient Account Number: [id number] Date of Birth/Sex: Treating RN: May 17, 1961 (60 y.o. [name] Primary Care Sheilyn Boehlke: [place] [place] Other Clinician: Referring Lavida Patch: Treating Mack Thurmon/Extender: [name], [name] in Treatment: 17 Clinic Level of Care Assessment Items TOOL 3 Quantity Score X- 1 0 Use when EandM  and Procedure is performed on FOLLOW-UP visit ASSESSMENTS - Nursing Assessment / Reassessment X- 1 10 Reassessment of Co-morbidities (includes updates in patient status) X- 1 5 Reassessment of Adherence to Treatment Plan ASSESSMENTS - Wound and Skin Assessment / Reassessment '[]'$  - Points for Wound Assessment can only be taken for a new wound of unknown or different etiology and a procedure is 0 NOT performed to that wound X- 1 5 Simple Wound Assessment / Reassessment - one wound '[]'$  - 0 Complex Wound Assessment / Reassessment - multiple wounds '[]'$  - 0 Dermatologic / Skin Assessment (not related to wound area) ASSESSMENTS - Focused Assessment '[]'$  - 0 Circumferential Edema Measurements - multi extremities '[]'$  - 0 Nutritional Assessment / Counseling / Intervention X- 1 5 Lower Extremity Assessment (monofilament, tuning fork, pulses) '[]'$  - 0 Peripheral Arterial Disease Assessment (using hand held doppler) ASSESSMENTS - Ostomy and/or Continence Assessment and Care '[]'$  - 0 Incontinence Assessment and Management '[]'$  - 0 Ostomy Care Assessment and Management (repouching, etc.) PROCESS - Coordination of Care '[]'$  - Points for Discharge Coordination can only be taken for a new wound of unknown or different etiology and a procedure 0 is NOT performed to that wound X- 1 15 Simple Patient / Family Education for ongoing care '[]'$  - 0 Complex (extensive) Patient / Family Education for ongoing care '[]'$  - 0 Staff obtains Programmer, systems, Records, T Results / Process Orders est X- 1 10 Staff telephones HHA, Nursing Homes / Clarify orders / etc '[]'$  - 0 Routine Transfer to another Facility (non-emergent condition) '[]'$  - 0 Routine Hospital Admission (non-emergent condition) '[]'$  - 0 New Admissions / Biomedical engineer / Ordering NPWT Apligraf, etc. , '[]'$  - 0 Emergency Hospital Admission (emergent condition) X- 1 10 Simple Discharge Coordination '[]'$  - 0 Complex (extensive) Discharge  Coordination PROCESS - Special Needs '[]'$  - 0 Pediatric / Minor Patient Management '[]'$  - 0 Isolation Patient Management '[]'$  - 0 Hearing / Language /  Visual special needs '[]'$  - 0 Assessment of Community assistance (transportation, D/C planning, etc.) '[]'$  - 0 Additional assistance / Altered mentation '[]'$  - 0 Support Surface(s) Assessment (bed, cushion, seat, etc.) INTERVENTIONS - Wound Cleansing / Measurement '[]'$  - Points for Wound Cleaning / Measurement, Wound Dressing, Specimen Collection and Specimen taken to lab can only 0 be taken for a new wound of unknown or different etiology and a procedure is NOT performed to that wound X- 1 5 Simple Wound Cleansing - one wound '[]'$  - 0 Complex Wound Cleansing - multiple wounds X- 1 5 Wound Imaging (photographs - any number of wounds) '[]'$  - 0 Wound Tracing (instead of photographs) X- 1 5 Simple Wound Measurement - one wound '[]'$  - 0 Complex Wound Measurement - multiple wounds INTERVENTIONS - Wound Dressings '[]'$  - 0 Small Wound Dressing one or multiple wounds '[]'$  - 0 Medium Wound Dressing one or multiple wounds X- 1 20 Large Wound Dressing one or multiple wounds INTERVENTIONS - Miscellaneous '[]'$  - 0 External ear exam '[]'$  - 0 Specimen Collection (cultures, biopsies, blood, body fluids, etc.) '[]'$  - 0 Specimen(s) / Culture(s) sent or taken to Lab for analysis '[]'$  - 0 Patient Transfer (multiple staff / Civil Service fast streamer / Similar devices) '[]'$  - 0 Simple Staple / Suture removal (25 or less) '[]'$  - 0 Complex Staple / Suture removal (26 or more) '[]'$  - 0 Hypo / Hyperglycemic Management (close monitor of Blood Glucose) X- 1 15 Ankle / Brachial Index (ABI) - do not check if billed separately X- 1 5 Vital Signs Has the patient been seen at the hospital within the last three years: Yes Total Score: 115 Level Of Care: New/Established - Level 3 Electronic Signature(s) Signed: 06/18/2021 12:35:09 PM By: Levan Hurst RN, BSN Entered By: Levan Hurst on  06/18/2021 12:33:01 -------------------------------------------------------------------------------- Complex / Palliative Patient Assessment Details Patient Name: Date of Service: [name], [name] 06/18/2021 10:30 A M Medical Record Number: [id number] Patient Account Number: [id number] Date of Birth/Sex: Treating RN: [birth year] (60 y.o. [name] Primary Care Charmin Aguiniga: [place] [place] Other Clinician: Referring Hilaria Titsworth: Treating Maher Shon/Extender: Jannette Spanner, [name] in Treatment: 17 Palliative Management Criteria Complex Wound Management Criteria Patient has remarkable or complex co-morbidities requiring medications or treatments that extend wound healing times. Examples: Diabetes mellitus with chronic renal failure or end stage renal disease requiring dialysis Advanced or poorly controlled rheumatoid arthritis Diabetes mellitus and end stage chronic obstructive pulmonary disease Active cancer with current chemo- or radiation therapy Type 2 DM, PAD, CHF, A-FIB, COPD, Smoker, Cirrhosis Care Approach Wound Care Plan: Complex Wound Management Electronic Signature(s) Signed: 06/21/2021 5:45:52 PM By: Levan Hurst RN, BSN Signed: 07/01/2021 2:38:54 PM By: [name] DO Entered By: Levan Hurst on 06/21/2021 15:10:48 -------------------------------------------------------------------------------- Encounter Discharge Information Details Patient Name: Date of Service: [name], [name] 06/18/2021 10:30 A M Medical Record Number: [id number] Patient Account Number: [id number] Date of Birth/Sex: Treating RN: February 27, 1961 (60 y.o. [name] Primary Care Aviah Sorci: [place] [place] Other Clinician: Referring Verla Bryngelson: Treating Elnore Cosens/Extender: [name], [name] in Treatment: 17 Encounter Discharge Information Items Post Procedure Vitals Discharge Condition: Stable Temperature (F): 98.5 Ambulatory Status: Wheelchair Pulse (bpm):  48 Discharge Destination: Home Respiratory Rate (breaths/min): 20 Transportation: Private Auto Blood Pressure (mmHg): 109/63 Accompanied By: alone Schedule Follow-up Appointment: Yes Clinical Summary of Care: Patient Declined Electronic Signature(s) Signed: 06/18/2021 12:35:09 PM By: Levan Hurst RN, BSN Entered By: Levan Hurst on 06/18/2021 12:34:48 -------------------------------------------------------------------------------- Lower Extremity Assessment Details Patient Name:  Date of Service: [name]. 06/18/2021 10:30 A M Medical Record Number: [id number] Patient Account Number: [id number] Date of Birth/Sex: Treating RN: Jun 27, 1961 (60 y.o. [name] Primary Care Courtnei Ruddell: [place] [place] Other Clinician: Referring Glenora Morocho: Treating Yogesh Cominsky/Extender: [name], [name] in Treatment: 17 Edema Assessment Assessed: [Left: No] [Right: No] Edema: [Left: Yes] [Right: Yes] Calf Left: Right: Point of Measurement: From Medial Instep 41 cm 40 cm Ankle Left: Right: Point of Measurement: From Medial Instep 21.5 cm 23 cm Vascular Assessment Pulses: Dorsalis Pedis Palpable: [Left:Yes] [Right:No] Blood Pressure: Brachial: [Left:109] Ankle: [Left:Dorsalis Pedis: 110 1.01] Electronic Signature(s) Signed: 06/18/2021 12:35:09 PM By: Levan Hurst RN, BSN Entered By: Levan Hurst on 06/18/2021 11:40:01 -------------------------------------------------------------------------------- Multi Wound Chart Details Patient Name: Date of Service: [name], [name] 06/18/2021 10:30 A M Medical Record Number: [id number] Patient Account Number: [id number] Date of Birth/Sex: Treating RN: 04-Jul-1961 (60 y.o. [name] Primary Care Haely Leyland: [facility], Romelle Starc[place] Other Clinician: Referring Makaria Poarch: Treating Catia Todorov/Extender: [name], [name] in Treatment: 17 Vital Signs Height(in): Pulse(bpm): 48 Weight(lbs): Blood  Pressure(mmHg): 109/63 Body Mass Index(BMI): Temperature(F): 98.5 Respiratory Rate(breaths/min): 20 Photos: [N/A:N/A] Right, Plantar Amputation Site - Left, Anterior Lower Leg N/A Wound Location: Transmetatarsal Gradually Appeared Blister N/A Wounding Event: Diabetic Wound/Ulcer of the Lower Diabetic Wound/Ulcer of the Lower N/A Primary Etiology: Extremity Extremity Chronic Obstructive Pulmonary Chronic Obstructive Pulmonary N/A Comorbid History: Disease (COPD), Sleep Apnea, Disease (COPD), Sleep Apnea, Arrhythmia, Congestive Heart Failure, Arrhythmia, Congestive Heart Failure, Coronary Artery Disease, Coronary Artery Disease, Hypertension, Peripheral Arterial Hypertension, Peripheral Arterial Disease, Cirrhosis , Type II Diabetes, Disease, Cirrhosis , Type II Diabetes, Osteoarthritis, Neuropathy Osteoarthritis, Neuropathy 02/05/2021 06/14/2021 N/A Date Acquired: 17 0 N/A Weeks of Treatment: Open Open N/A Wound Status: 1.2x1.5x0.3 6x1.1x0.1 N/A Measurements L x W x D (cm) 1.414 5.184 N/A A (cm) : rea 0.424 0.518 N/A Volume (cm) : 13.50% 0.00% N/A % Reduction in A rea: 13.50% 0.00% N/A % Reduction in Volume: Grade 2 Grade 1 N/A Classification: Medium Medium N/A Exudate A mount: Serosanguineous Serosanguineous N/A Exudate Type: red, brown red, brown N/A Exudate Color: Thickened Flat and Intact N/A Wound Margin: Large (67-100%) Large (67-100%) N/A Granulation A mount: Pink, Pale Pink N/A Granulation Quality: None Present (0%) None Present (0%) N/A Necrotic A mount: Fat Layer (Subcutaneous Tissue): Yes Fat Layer (Subcutaneous Tissue): Yes N/A Exposed Structures: Fascia: No Fascia: No Tendon: No Tendon: No Muscle: No Muscle: No Joint: No Joint: No Bone: No Bone: No None None N/A Epithelialization: Debridement - Excisional N/A N/A Debridement: Pre-procedure Verification/Time Out 11:45 N/A N/A Taken: Subcutaneous, Slough N/A N/A Tissue  Debrided: Skin/Subcutaneous Tissue N/A N/A Level: 1.8 N/A N/A Debridement A (sq cm): rea Curette N/A N/A Instrument: Minimum N/A N/A Bleeding: Pressure N/A N/A Hemostasis A chieved: 0 N/A N/A Procedural Pain: 0 N/A N/A Post Procedural Pain: Procedure was tolerated well N/A N/A Debridement Treatment Response: 1.2x1.5x0.3 N/A N/A Post Debridement Measurements L x W x D (cm) 0.424 N/A N/A Post Debridement Volume: (cm) Debridement N/A N/A Procedures Performed: Treatment Notes Electronic Signature(s) Signed: 06/18/2021 12:14:15 PM By: [name] DO Signed: 06/18/2021 1:36:18 PM By: Lorrin Jackson Entered By: [name] on 06/18/2021 12:06:58 -------------------------------------------------------------------------------- Multi-Disciplinary Care Plan Details Patient Name: Date of Service: [name], [name] 06/18/2021 10:30 A M Medical Record Number: [id number] Patient Account Number: [id number] Date of Birth/Sex: Treating RN: 11-23-1960 (60 y.o. [name] Primary Care Dalonda Simoni: [place] [place] Other Clinician: Referring Janey Petron: Treating Jag Lenz/Extender: [place],  [facility], [name] in Treatment: 17 Active Inactive Wound/Skin Impairment Nursing Diagnoses: Impaired tissue integrity Goals: Patient/caregiver will verbalize understanding of skin care regimen Date Initiated: 02/19/2021 Target Resolution Date: 07/02/2021 Goal Status: Active Ulcer/skin breakdown will have a volume reduction of 30% by week 4 Date Initiated: 02/19/2021 Date Inactivated: 03/29/2021 Target Resolution Date: 04/09/2021 Goal Status: Unmet Unmet Reason: PAD Interventions: Assess patient/caregiver ability to obtain necessary supplies Assess patient/caregiver ability to perform ulcer/skin care regimen upon admission and as needed Assess ulceration(s) every visit Provide education on ulcer and skin care Treatment Activities: Skin care regimen initiated : 02/19/2021 Topical  wound management initiated : 02/19/2021 Notes: Electronic Signature(s) Signed: 06/18/2021 12:35:09 PM By: Levan Hurst RN, BSN Entered By: Levan Hurst on 06/18/2021 12:32:07 -------------------------------------------------------------------------------- Pain Assessment Details Patient Name: Date of Service: [name], [name] 06/18/2021 10:30 A M Medical Record Number: [id number] Patient Account Number: [id number] Date of Birth/Sex: Treating RN: 1961/07/20 (60 y.o. [name] Primary Care Zen Felling: [facility], Romelle Starc[place] Other Clinician: Referring Maysa Lynn: Treating Leighla Chestnutt/Extender: Jannette Spanner, [name] in Treatment: 17 Active Problems Location of Pain Severity and Description of Pain Patient Has Paino Yes Site Locations Rate the pain. Rate the pain. Current Pain Level: 8 Pain Management and Medication Current Pain Management: Electronic Signature(s) Signed: 06/18/2021 1:36:18 PM By: Lorrin Jackson Signed: 06/29/2021 11:33:27 AM By: Sandre Kitty Entered By: Sandre Kitty on 06/18/2021 10:58:08 -------------------------------------------------------------------------------- Patient/Caregiver Education Details Patient Name: Date of Service: [name] 8/5/2022andnbsp10:30 A M Medical Record Number: [id number] Patient Account Number: [id number] Date of Birth/Gender: Treating RN: 04-Oct-1961 (60 y.o. [name] Primary Care Physician: [place] [place] Other Clinician: Referring Physician: Treating Physician/Extender: Jannette Spanner, Jairo Ben in Treatment: 17 Education Assessment Education Provided To: Patient Education Topics Provided Wound/Skin Impairment: Methods: Explain/Verbal Responses: State content correctly [employer]) Signed: 06/18/2021 12:35:09 PM By: Levan Hurst RN, BSN Entered By: Levan Hurst on 06/18/2021  12:32:18 -------------------------------------------------------------------------------- Wound Assessment Details Patient Name: Date of Service: [name], [name] 06/18/2021 10:30 A M Medical Record Number: [id number] Patient Account Number: [id number] Date of Birth/Sex: Treating RN: Mar 19, 1961 (60 y.o. [name] Primary Care Cherrie Franca: [facility], [place] Other Clinician: Referring Taylie Helder: Treating Jonaven Hilgers/Extender: [name], [name] in Treatment: 17 Wound Status Wound Number: 16 Primary Diabetic Wound/Ulcer of the Lower Extremity Etiology: Wound Location: Right, Plantar Amputation Site - Transmetatarsal Wound Open Wounding Event: Gradually Appeared Status: Date Acquired: 02/05/2021 Comorbid Chronic Obstructive Pulmonary Disease (COPD), Sleep Apnea, Weeks Of Treatment: 17 History: Arrhythmia, Congestive Heart Failure, Coronary Artery Disease, Clustered Wound: No Hypertension, Peripheral Arterial Disease, Cirrhosis , Type II Diabetes, Osteoarthritis, Neuropathy Photos Wound Measurements Length: (cm) 1.2 Width: (cm) 1.5 Depth: (cm) 0.3 Area: (cm) 1.414 Volume: (cm) 0.424 % Reduction in Area: 13.5% % Reduction in Volume: 13.5% Epithelialization: None Tunneling: No Undermining: No Wound Description Classification: Grade 2 Wound Margin: Thickened Exudate Amount: Medium Exudate Type: Serosanguineous Exudate Color: red, brown Foul Odor After Cleansing: No Slough/Fibrino No Wound Bed Granulation Amount: Large (67-100%) Exposed Structure Granulation Quality: Pink, Pale Fascia Exposed: No Necrotic Amount: None Present (0%) Fat Layer (Subcutaneous Tissue) Exposed: Yes Tendon Exposed: No Muscle Exposed: No Joint Exposed: No Bone Exposed: No Treatment Notes Wound #16 (Amputation Site - Transmetatarsal) Wound Laterality: Plantar, Right Cleanser Soap and Water Discharge Instruction: May shower and wash wound with dial antibacterial soap and  water prior to dressing change. Wound Cleanser Discharge Instruction: Cleanse the wound with wound cleanser or normal saline prior to applying  a clean dressing using gauze sponges, not tissue or cotton balls. Peri-Wound Care Sween Lotion (Moisturizing lotion) Discharge Instruction: Apply moisturizing lotion as directed Topical Primary Dressing KerraCel Ag Gelling Fiber Dressing, 2x2 in (silver alginate) Discharge Instruction: Apply silver alginate to wound bed as instructed Santyl Ointment Discharge Instruction: Apply in clinic only, under alginate Secondary Dressing Woven Gauze Sponge, Non-Sterile 4x4 in Discharge Instruction: Apply over primary dressing as directed. Optifoam Non-Adhesive Dressing, 4x4 in Discharge Instruction: Foam donut to help offload Secured With Compression Wrap Kerlix Roll 4.5x3.1 (in/yd) Discharge Instruction: Apply Kerlix and Coban compression as directed. Coban Self-Adherent Wrap 4x5 (in/yd) Discharge Instruction: Apply over Kerlix as directed. Compression Stockings Add-Ons Electronic Signature(s) Signed: 06/18/2021 12:35:09 PM By: Levan Hurst RN, BSN Signed: 06/18/2021 1:36:18 PM By: Lorrin Jackson Entered By: Levan Hurst on 06/18/2021 11:40:27 -------------------------------------------------------------------------------- Wound Assessment Details Patient Name: Date of Service: [name], [name] 06/18/2021 10:30 A M Medical Record Number: [id number] Patient Account Number: [id number] Date of Birth/Sex: Treating RN: [birth year] (60 y.o. [name] Primary Care Tomeeka Plaugher: [facility], [place]arc[place] Other Clinician: Referring Zanaiya Calabria: Treating Jashon Ishida/Extender: [name], [name] in Treatment: 17 Wound Status Wound Number: 20 Primary Diabetic Wound/Ulcer of the Lower Extremity Etiology: Wound Location: Left, Anterior Lower Leg Wound Open Wounding Event: Blister Status: Date Acquired: 06/14/2021 Comorbid Chronic Obstructive  Pulmonary Disease (COPD), Sleep Apnea, Weeks Of Treatment: 0 History: Arrhythmia, Congestive Heart Failure, Coronary Artery Disease, Clustered Wound: No Hypertension, Peripheral Arterial Disease, Cirrhosis , Type II Diabetes, Osteoarthritis, Neuropathy Photos Wound Measurements Length: (cm) 6 Width: (cm) 1.1 Depth: (cm) 0.1 Area: (cm) 5.184 Volume: (cm) 0.518 % Reduction in Area: 0% % Reduction in Volume: 0% Epithelialization: None Tunneling: No Undermining: No Wound Description Classification: Grade 1 Wound Margin: Flat and Intact Exudate Amount: Medium Exudate Type: Serosanguineous Exudate Color: red, brown Foul Odor After Cleansing: No Slough/Fibrino No Wound Bed Granulation Amount: Large (67-100%) Exposed Structure Granulation Quality: Pink Fascia Exposed: No Necrotic Amount: None Present (0%) Fat Layer (Subcutaneous Tissue) Exposed: Yes Tendon Exposed: No Muscle Exposed: No Joint Exposed: No Bone Exposed: No Treatment Notes Wound #20 (Lower Leg) Wound Laterality: Left, Anterior Cleanser Soap and Water Discharge Instruction: May shower and wash wound with dial antibacterial soap and water prior to dressing change. Wound Cleanser Discharge Instruction: Cleanse the wound with wound cleanser or normal saline prior to applying a clean dressing using gauze sponges, not tissue or cotton balls. Peri-Wound Care Sween Lotion (Moisturizing lotion) Discharge Instruction: Apply moisturizing lotion as directed Topical Primary Dressing KerraCel Ag Gelling Fiber Dressing, 2x2 in (silver alginate) Discharge Instruction: Apply silver alginate to wound bed as instructed Secondary Dressing Woven Gauze Sponge, Non-Sterile 4x4 in Discharge Instruction: Apply over primary dressing as directed. ABD Pad, 5x9 Discharge Instruction: Apply over primary dressing as directed. Secured With Compression Wrap Kerlix Roll 4.5x3.1 (in/yd) Discharge Instruction: Apply Kerlix and Coban  compression as directed. Coban Self-Adherent Wrap 4x5 (in/yd) Discharge Instruction: Apply over Kerlix as directed. Compression Stockings Add-Ons Electronic Signature(s) Signed: 06/18/2021 12:35:09 PM By: Levan Hurst RN, BSN Signed: 06/18/2021 1:36:18 PM By: Lorrin Jackson Entered By: Levan Hurst on 06/18/2021 11:40:53 -------------------------------------------------------------------------------- [place] Details Patient Name: Date of Service: [name], [name] 06/18/2021 10:30 A M Medical Record Number: [id number] Patient Account Number: [id number] Date of Birth/Sex: Treating RN: 08-Jun-1961 (60 y.o. [name] Primary Care Shreshta Medley: [place] Other Clinician: Referring Farris Blash: Treating Keyuna Cuthrell/Extender: [name], [name] in Treatment: 17 Vital Signs Time Taken: 10:57 Temperature (F): 98.5 Pulse (  bpm): 48 Respiratory Rate (breaths/min): 20 Blood Pressure (mmHg): 109/63 Reference Range: 80 - 120 mg / dl Electronic Signature(s) Signed: 06/29/2021 11:33:27 AM By: Sandre Kitty Entered By: Sandre Kitty on 06/18/2021 10:57:58

## 2021-07-06 ENCOUNTER — Encounter (HOSPITAL_BASED_OUTPATIENT_CLINIC_OR_DEPARTMENT_OTHER): Payer: Medicare (Managed Care) | Admitting: Internal Medicine

## 2021-07-06 ENCOUNTER — Other Ambulatory Visit: Payer: Self-pay

## 2021-07-06 DIAGNOSIS — S91301A Unspecified open wound, right foot, initial encounter: Secondary | ICD-10-CM | POA: Diagnosis not present

## 2021-07-07 NOTE — Progress Notes (Signed)
OUDIA, KAUTZMAN ([id number]) Visit Report for 07/06/2021 Arrival Information Details Patient Name: Date of Service: [name] 07/06/2021 10:15 A M Medical Record Number: [id number] Patient Account Number: [id number] Date of Birth/Sex: Treating RN: 1960-12-09 (60 y.o. [name] Primary Care Bastien Strawser: [place] Other Clinician: Referring Barrie Wale: Treating Libero Puthoff/Extender: Johnn Hai, Jairo Ben in Treatment: 19 Visit Information History Since Last Visit Added or deleted any medications: No Patient Arrived: Ambulatory Any new allergies or adverse reactions: No Arrival Time: 10:43 Had a fall or experienced change in No Accompanied By: self activities of daily living that may affect Transfer Assistance: None risk of falls: Patient Identification Verified: Yes Signs or symptoms of abuse/neglect since last visito No Secondary Verification Process Completed: Yes Hospitalized since last visit: No Patient Requires Transmission-Based Precautions: No Implantable device outside of the clinic excluding No Patient Has Alerts: No cellular tissue based products placed in the center since last visit: Has Dressing in Place as Prescribed: Yes Pain Present Now: Yes Electronic Signature(s) Signed: 07/06/2021 6:01:23 PM By: Baruch Gouty RN, BSN Entered By: Baruch Gouty on 07/06/2021 10:48:29 -------------------------------------------------------------------------------- Encounter Discharge Information Details Patient Name: Date of Service: [name], [name] 07/06/2021 10:15 A M Medical Record Number: [id number] Patient Account Number: [id number] Date of Birth/Sex: Treating RN: Oct 12, 1961 (60 y.o. [name] Primary Care Bubba Vanbenschoten: [place] Other Clinician: Referring Montrel Donahoe: Treating Keeyon Privitera/Extender: Johnn Hai, Jairo Ben in Treatment: 19 Encounter Discharge Information Items Post Procedure Vitals Discharge  Condition: Stable Temperature (F): 98.8 Ambulatory Status: Ambulatory Pulse (bpm): 90 Discharge Destination: Home Respiratory Rate (breaths/min): 20 Transportation: Private Auto Blood Pressure (mmHg): 125/72 Accompanied By: self Schedule Follow-up Appointment: Yes Clinical Summary of Care: Patient Declined Electronic Signature(s) Signed: 07/06/2021 6:01:23 PM By: Baruch Gouty RN, BSN Entered By: Baruch Gouty on 07/06/2021 11:46:48 -------------------------------------------------------------------------------- Lower Extremity Assessment Details Patient Name: Date of Service: Durward Parcel S. 07/06/2021 10:15 A M Medical Record Number: [id number] Patient Account Number: [id number] Date of Birth/Sex: Treating RN: [birth year] (60 y.o. [name] Primary Care Betta Balla: [place] Other Clinician: Referring Thuan Tippett: Treating Kotaro Buer/Extender: Johnn Hai, [name] in Treatment: 19 Edema Assessment Assessed: [Left: No] [Right: No] Edema: [Left: Yes] [Right: Yes] Calf Left: Right: Point of Measurement: From Medial Instep 44 cm 45 cm Ankle Left: Right: Point of Measurement: From Medial Instep 22.8 cm 22.5 cm Vascular Assessment Pulses: Dorsalis Pedis Palpable: [Right:No] Electronic Signature(s) Signed: 07/06/2021 6:01:23 PM By: Baruch Gouty RN, BSN Entered By: Baruch Gouty on 07/06/2021 10:53:20 -------------------------------------------------------------------------------- Multi Wound Chart Details Patient Name: Date of Service: [name], DO [name] 07/06/2021 10:15 A M Medical Record Number: [id number] Patient Account Number: [id number] Date of Birth/Sex: Treating RN: Jan 27, 1961 (60 y.o. [name] Primary Care Ruchel Brandenburger: [place] Other Clinician: Referring Charlise Giovanetti: Treating Natajah Derderian/Extender: Johnn Hai, [name] in Treatment: 19 Vital Signs Height(in): 64 Capillary Blood Glucose(mg/dl):  202 Weight(lbs): 222 Pulse(bpm): 55 Body Mass Index(BMI): 60 Blood Pressure(mmHg): 125/72 Temperature(F): 98.8 Respiratory Rate(breaths/min): 22 Photos: [16:Right, Plantar Amputation Site -] [20:Left, Anterior Lower Leg] [N/A:N/A N/A] Wound Location: [16:Transmetatarsal Gradually Appeared] [20:Blister] [N/A:N/A] Wounding Event: [16:Diabetic Wound/Ulcer of the Lower] [20:Diabetic Wound/Ulcer of the Lower] [N/A:N/A] Primary Etiology: [16:Extremity Chronic Obstructive Pulmonary] [20:Extremity Chronic Obstructive Pulmonary] [N/A:N/A] Comorbid History: [16:Disease (COPD), Sleep Apnea, Arrhythmia, Congestive Heart Failure, Arrhythmia, Congestive Heart Failure, Coronary Artery Disease, Hypertension, Peripheral Arterial Disease, Cirrhosis , Type II Diabetes, Disease, Cirrhosis , Type II  Diabetes, Osteoarthritis, Neuropathy 02/05/2021] [20:Disease (COPD), Sleep Apnea, Coronary  Artery Disease, Hypertension, Peripheral Arterial Osteoarthritis, Neuropathy 06/14/2021] [N/A:N/A] Date Acquired: [16:19] [20:2] [N/A:N/A] Weeks of Treatment: [16:Open] [20:Healed - Epithelialized] [N/A:N/A] Wound Status: [16:1.1x1.6x0.9] [20:0x0x0] [N/A:N/A] Measurements L x W x D (cm) [16:1.382] [20:0] [N/A:N/A] A (cm) : rea [16:1.244] [20:0] [N/A:N/A] Volume (cm) : [16:15.40%] [20:100.00%] [N/A:N/A] % Reduction in A [16:rea: -153.90%] [20:100.00%] [N/A:N/A] % Reduction in Volume: [16:9] Starting Position 1 (o'clock): [16:4] Ending Position 1 (o'clock): [16:0.9] Maximum Distance 1 (cm): [16:Yes] [20:No] [N/A:N/A] Undermining: [16:Grade 2] [20:Grade 1] [N/A:N/A] Classification: [16:Medium] [20:None Present] [N/A:N/A] Exudate A mount: [16:Serous] [20:N/A] [N/A:N/A] Exudate Type: [16:amber] [20:N/A] [N/A:N/A] Exudate Color: [16:Thickened] [20:N/A] [N/A:N/A] Wound Margin: [16:Large (67-100%)] [20:None Present (0%)] [N/A:N/A] Granulation A mount: [16:Red] [20:N/A] [N/A:N/A] Granulation Quality: [16:None Present (0%)]  [20:None Present (0%)] [N/A:N/A] Necrotic A mount: [16:Fat Layer (Subcutaneous Tissue): Yes Fascia: No] [N/A:N/A] Exposed Structures: [16:Fascia: No Tendon: No Muscle: No Joint: No Bone: No None] [20:Fat Layer (Subcutaneous Tissue): No Tendon: No Muscle: No Joint: No Bone: No Large (67-100%)] [N/A:N/A] Epithelialization: [16:Debridement - Excisional] [20:N/A] [N/A:N/A] Debridement: Pre-procedure Verification/Time Out 11:17 [20:N/A] [N/A:N/A] Taken: [16:Lidocaine] [20:N/A] [N/A:N/A] Pain Control: [16:Callus, Subcutaneous, Slough] [20:N/A] [N/A:N/A] Tissue Debrided: [16:Skin/Subcutaneous Tissue] [20:N/A] [N/A:N/A] Level: [16:1.76] [20:N/A] [N/A:N/A] Debridement A (sq cm): [16:rea Curette] [20:N/A] [N/A:N/A] Instrument: [16:Minimum] [20:N/A] [N/A:N/A] Bleeding: [16:Pressure] [20:N/A] [N/A:N/A] Hemostasis A chieved: [16:0] [20:N/A] [N/A:N/A] Procedural Pain: [16:0] [20:N/A] [N/A:N/A] Post Procedural Pain: [16:Procedure was tolerated well] [20:N/A] [N/A:N/A] Debridement Treatment Response: [16:1.1x1.6x0.9] [20:N/A] [N/A:N/A] Post Debridement Measurements L x W x D (cm) [16:1.244] [20:N/A] [N/A:N/A] Post Debridement Volume: (cm) [16:macerated periwound] [20:N/A] [N/A:N/A] Assessment Notes: [16:Debridement] [20:N/A] [N/A:N/A] Treatment Notes 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 Wound Cleanser Discharge Instruction: Cleanse the wound with wound cleanser or normal saline prior to applying a clean dressing using gauze sponges, not tissue or cotton balls. Peri-Wound Care Sween Lotion (Moisturizing lotion) Discharge Instruction: Apply moisturizing lotion as directed Topical Primary Dressing Hydrofera Blue Classic Foam, 2x2 in Discharge Instruction: Moisten with saline prior to applying to wound bed Secondary  Dressing Woven Gauze Sponge, Non-Sterile 4x4 in Discharge Instruction: Apply over primary dressing as directed. Optifoam Non-Adhesive Dressing, 4x4 in Discharge Instruction: Foam donut to help offload Secured With Compression Wrap Kerlix Roll 4.5x3.1 (in/yd) Discharge Instruction: Apply Kerlix and Coban compression as directed. Coban Self-Adherent Wrap 4x5 (in/yd) Discharge Instruction: Apply over Kerlix as directed. Compression Stockings Add-Ons Electronic Signature(s) Signed: 07/06/2021 5:31:05 PM By: [name] RN Signed: 07/07/2021 8:02:04 AM By: Linton Ham MD Entered By: Linton Ham on 07/06/2021 12:50:41 -------------------------------------------------------------------------------- Multi-Disciplinary Care Plan Details Patient Name: Date of Service: [name], [name] 07/06/2021 10:15 A M Medical Record Number: [id number] Patient Account Number: [id number] Date of Birth/Sex: Treating RN: Jun 20, 1961 (60 y.o. [name] Primary Care Mahari Vankirk: [place] Other Clinician: Referring Celsa Nordahl: Treating Jsoeph Podesta/Extender: Johnn Hai, [name] in Treatment: 19 Active Inactive Wound/Skin Impairment Nursing Diagnoses: Impaired tissue integrity Goals: Patient/caregiver will verbalize understanding of skin care regimen Date Initiated: 02/19/2021 Target Resolution Date: 07/13/2021 Goal Status: Active Ulcer/skin breakdown will have a volume reduction of 30% by week 4 Date Initiated: 02/19/2021 Date Inactivated: 03/29/2021 Target Resolution Date: 04/09/2021 Goal Status: Unmet Unmet Reason: PAD Interventions: Assess patient/caregiver ability to obtain necessary supplies Assess patient/caregiver ability to perform ulcer/skin care regimen upon admission and as needed Assess ulceration(s) every visit Provide education on ulcer and skin care Treatment Activities: Skin care  regimen initiated : 02/19/2021 Topical wound management initiated :  02/19/2021 Notes: Electronic Signature(s) Signed: 07/06/2021 5:31:05 PM By: [name] RN Entered By: [name] on 07/06/2021 11:20:35 -------------------------------------------------------------------------------- Pain Assessment Details Patient Name: Date of Service: Durward Parcel S. 07/06/2021 10:15 A M Medical Record Number: [id number] Patient Account Number: [id number] Date of Birth/Sex: Treating RN: 1961-08-02 (60 y.o. [name] Primary Care Medford Staheli: [facility] Other Clinician: Referring Reason Helzer: Treating Emilygrace Grothe/Extender: Johnn Hai, [name] in Treatment: 19 Active Problems Location of Pain Severity and Description of Pain Patient Has Paino Yes Site Locations Pain Location: Generalized Pain With Dressing Change: No Duration of the Pain. Constant / Intermittento Constant Rate the pain. Current Pain Level: 8 Character of Pain Describe the Pain: Aching, Burning Pain Management and Medication Current Pain Management: Medication: Yes Is the Current Pain Management Adequate: Adequate How does your wound impact your activities of daily livingo Sleep: Yes Bathing: No Appetite: No Relationship With Others: No Bladder Continence: No Emotions: No Bowel Continence: No Drive: Yes Toileting: No Hobbies: Yes Dressing: No Electronic Signature(s) Signed: 07/06/2021 6:01:23 PM By: Baruch Gouty RN, BSN Entered By: Baruch Gouty on 07/06/2021 10:52:23 -------------------------------------------------------------------------------- Patient/Caregiver Education Details Patient Name: Date of Service: [name] 8/23/2022andnbsp10:15 A M Medical Record Number: [id number] Patient Account Number: [id number] Date of Birth/Gender: Treating RN: 12-Oct-1961 (60 y.o. [name] Primary Care Physician: [place] Other Clinician: Referring Physician: Treating Physician/Extender: Johnn Hai,  Jairo Ben in Treatment: 19 Education Assessment Education Provided To: Patient Education Topics Provided Basic Hygiene: Methods: Explain/Verbal Responses: State content correctly [employer]) Signed: 07/06/2021 5:31:05 PM By: [name] RN Entered By: [name] on 07/06/2021 11:20:50 -------------------------------------------------------------------------------- Wound Assessment Details Patient Name: Date of Service: Durward Parcel S. 07/06/2021 10:15 A M Medical Record Number: [id number] Patient Account Number: [id number] Date of Birth/Sex: Treating RN: [birth year] (60 y.o. [name] Primary Care Jakai Onofre: [place] Other Clinician: Referring Najae Rathert: Treating Adriana Quinby/Extender: Johnn Hai, [name] in Treatment: 19 Wound Status Wound Number: 16 Primary Diabetic Wound/Ulcer of the Lower Extremity Etiology: Wound Location: Right, Plantar Amputation Site - Transmetatarsal Wound Open Wounding Event: Gradually Appeared Status: Date Acquired: 02/05/2021 Comorbid Chronic Obstructive Pulmonary Disease (COPD), Sleep Apnea, Weeks Of Treatment: 19 History: Arrhythmia, Congestive Heart Failure, Coronary Artery Disease, Clustered Wound: No Hypertension, Peripheral Arterial Disease, Cirrhosis , Type II Diabetes, Osteoarthritis, Neuropathy Photos Wound Measurements Length: (cm) 1.1 Width: (cm) 1.6 Depth: (cm) 0.9 Area: (cm) 1.382 Volume: (cm) 1.244 % Reduction in Area: 15.4% % Reduction in Volume: -153.9% Epithelialization: None Tunneling: No Undermining: Yes Starting Position (o'clock): 9 Ending Position (o'clock): 4 Maximum Distance: (cm) 0.9 Wound Description Classification: Grade 2 Wound Margin: Thickened Exudate Amount: Medium Exudate Type: Serous Exudate Color: amber Wound Bed Granulation Amount: Large (67-100%) Granulation Quality: Red Necrotic Amount: None Present (0%) Foul Odor After Cleansing:  No Slough/Fibrino No Exposed Structure Fascia Exposed: No Fat Layer (Subcutaneous Tissue) Exposed: Yes Tendon Exposed: No Muscle Exposed: No Joint Exposed: No Bone Exposed: No Assessment Notes macerated periwound Treatment Notes 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 Wound Cleanser Discharge Instruction: Cleanse the wound with wound cleanser or normal saline prior to applying a clean dressing using gauze sponges, not tissue or cotton balls. Peri-Wound Care Sween Lotion (Moisturizing lotion) Discharge Instruction: Apply moisturizing lotion as directed Topical Primary  Dressing Hydrofera Blue Classic Foam, 2x2 in Discharge Instruction: Moisten with saline prior to applying to wound bed Secondary Dressing Woven Gauze Sponge, Non-Sterile 4x4 in Discharge Instruction: Apply over primary dressing as directed. Optifoam Non-Adhesive Dressing, 4x4 in Discharge Instruction: Foam donut to help offload Secured With Compression Wrap Kerlix Roll 4.5x3.1 (in/yd) Discharge Instruction: Apply Kerlix and Coban compression as directed. Coban Self-Adherent Wrap 4x5 (in/yd) Discharge Instruction: Apply over Kerlix as directed. Compression Stockings Add-Ons Electronic Signature(s) Signed: 07/06/2021 5:51:35 PM By: Lorrin Jackson Signed: 07/06/2021 6:01:23 PM By: Baruch Gouty RN, BSN Entered By: Lorrin Jackson on 07/06/2021 11:00:34 -------------------------------------------------------------------------------- Wound Assessment Details Patient Name: Date of Service: [name], [name] 07/06/2021 10:15 A M Medical Record Number: [id number] Patient Account Number: [id number] Date of Birth/Sex: Treating RN: Oct 08, 1961 (60 y.o. [name] Primary Care Haron Beilke: [place] Other Clinician: Referring  Teyana Pierron: Treating Pattrick Bady/Extender: Johnn Hai, [name] in Treatment: 19 Wound Status Wound Number: 20 Primary Diabetic Wound/Ulcer of the Lower Extremity Etiology: Wound Location: Left, Anterior Lower Leg Wound Healed - Epithelialized Wounding Event: Blister Status: Date Acquired: 06/14/2021 Comorbid Chronic Obstructive Pulmonary Disease (COPD), Sleep Apnea, Weeks Of Treatment: 2 History: Arrhythmia, Congestive Heart Failure, Coronary Artery Disease, Clustered Wound: No Hypertension, Peripheral Arterial Disease, Cirrhosis , Type II Diabetes, Osteoarthritis, Neuropathy Photos Wound Measurements Length: (cm) Width: (cm) Depth: (cm) Area: (cm) Volume: (cm) 0 % Reduction in Area: 100% 0 % Reduction in Volume: 100% 0 Epithelialization: Large (67-100%) 0 Tunneling: No 0 Undermining: No Wound Description Classification: Grade 1 Exudate Amount: None Present Foul Odor After Cleansing: No Slough/Fibrino No Wound Bed Granulation Amount: None Present (0%) Exposed Structure Necrotic Amount: None Present (0%) Fascia Exposed: No Fat Layer (Subcutaneous Tissue) Exposed: No Tendon Exposed: No Muscle Exposed: No Joint Exposed: No Bone Exposed: No Electronic Signature(s) Signed: 07/06/2021 5:51:35 PM By: Lorrin Jackson Signed: 07/06/2021 6:01:23 PM By: Baruch Gouty RN, BSN Entered By: Lorrin Jackson on 07/06/2021 11:01:15 -------------------------------------------------------------------------------- Vitals Details Patient Name: Date of Service: [name], [name] 07/06/2021 10:15 A M Medical Record Number: [id number] Patient Account Number: [id number] Date of Birth/Sex: Treating RN: 1961/11/08 (60 y.o. [name] Primary Care Khaila Velarde: [place] Other Clinician: Referring Margot Oriordan: Treating Leyan Branden/Extender: Johnn Hai, [name] in Treatment: 19 Vital Signs Time Taken: 10:48 Temperature (F): 98.8 Height (in):  64 Pulse (bpm): 90 Source: Stated Respiratory Rate (breaths/min): 22 Weight (lbs): 222 Blood Pressure (mmHg): 125/72 Source: Stated Capillary Blood Glucose (mg/dl): 202 Body Mass Index (BMI): 38.1 Reference Range: 80 - 120 mg / dl Notes glucose per pt report this am Electronic Signature(s) Signed: 07/06/2021 6:01:23 PM By: Baruch Gouty RN, BSN Entered By: Baruch Gouty on 07/06/2021 10:49:20

## 2021-07-07 NOTE — Progress Notes (Signed)
[name], [name] ([id number]) Visit Report for 07/06/2021 Debridement Details Patient Name: Date of Service: [name] 07/06/2021 10:15 A M Medical Record Number: [id number] Patient Account Number: [id number] Date of Birth/Sex: Treating RN: 01-07-1961 (60 y.o. [name] Primary Care Provider: [place] [place] Other Clinician: Referring Provider: Treating Provider/Extender: Johnn Hai, Jairo Ben in Treatment: 19 Debridement Performed for Assessment: Wound #16 Right,Plantar Amputation Site - Transmetatarsal Performed By: Physician Ricard Dillon., MD Debridement Type: Debridement Severity of Tissue Pre Debridement: Fat layer exposed Level of Consciousness (Pre-procedure): Awake and Alert Pre-procedure Verification/Time Out Yes - 11:17 Taken: Start Time: 11:17 Pain Control: Lidocaine T Area Debrided (L x W): otal 1.1 (cm) x 1.6 (cm) = 1.76 (cm) Tissue and other material debrided: Viable, Non-Viable, Callus, Slough, Subcutaneous, Skin: Dermis , Skin: Epidermis, Slough Level: Skin/Subcutaneous Tissue Debridement Description: Excisional Instrument: Curette Bleeding: Minimum Hemostasis Achieved: Pressure End Time: 11:17 Procedural Pain: 0 Post Procedural Pain: 0 Response to Treatment: Procedure was tolerated well Level of Consciousness (Post- Awake and Alert procedure): Post Debridement Measurements of Total Wound Length: (cm) 1.1 Width: (cm) 1.6 Depth: (cm) 0.9 Volume: (cm) 1.244 Character of Wound/Ulcer Post Debridement: Improved Severity of Tissue Post Debridement: Fat layer exposed Post Procedure Diagnosis Same as Pre-procedure Electronic Signature(s) Signed: 07/06/2021 5:31:05 PM By: [name] RN Signed: 07/07/2021 8:02:04 AM By: Linton Ham MD Entered By: Linton Ham on 07/06/2021 12:50:55 -------------------------------------------------------------------------------- HPI Details Patient Name: Date of Service: Karla Corwin, [name] 07/06/2021 10:15 A M Medical Record Number: [id number] Patient Account Number: [id number] Date of Birth/Sex: Treating RN: 1961-09-24 (60 y.o. [name] Primary Care Provider: [place] Other Clinician: Referring Provider: Treating Provider/Extender: Johnn Hai, [name] in Treatment: 19 History of Present Illness HPI Description: This 60 year old patient who has a very long significant history of diabetes mellitus, previous alcohol and nicotine abuse, chronic data disease, COPD, diabetes mellitus, height hypertension, critical lower limb ischemia with several wounds being managed at the wound Center at [facility] for over a year. She was recently in the ER at [facility] and was referred to our center. He has had a long history of critical limb ischemia and over a period of time has had balloon angioplasties in March 2016, endarterectomy of the left carotid, lower extremity angiogram and treatment by Dr. Quay Burow, several cardiac catheterizations. Most recently she had an x-ray of her left foot while in the ER which showed no acute abnormality. during this ER visit she was started on ciprofloxacin and asked to continue with the wound care physicians at [facility]. Her last ABI done in June 2017 showed the right side to be 0.28 in the left side to be 0.48. Her right toe brachial index was 0.17 on the right and 0.27 on the left. her last hemoglobin A1c was 11.1% She continues to smoke about a pack of cigarettes a day. 03/28/2017 -- -- right foot x-ray -- IMPRESSION:Areas of soft tissue swelling. Mild subluxation second PIP joint. No frank dislocation or fracture. No erosive change or bony destruction. No soft tissue ulceration or radiopaque foreign body evident by radiography. There is plantar fascia calcification with a nearby inferior calcaneal spur. There are foci of arterial vascular calcification. 04/04/2017 -- the patient  continues to be noncompliant and continues to complain of a lot of pain and has not done anything about quitting smoking Readmission: 06/26/18 on evaluation today patient presents for reevaluation she has not been seen in our office  since May 2018. Since she was last seen here in our office she has undergone testing at Dr. Elspeth Cho office where it was revealed that she had an ABI of 0.68 with her previous ABI being 0.64 and a left ABI of 0.38 with previous ABI being 0.34 this study which was performed on 04/05/18 was pretty much equivalent to the study performed on 11/22/17. The findings in the end revealed on the right would appear to be moderate right lower extremity arterial disease on the left Dr. Gwenlyn Found states moderate in the report but unfortunately this appears to be much more severe at 0.38 compared to the right. Subsequently the patient was scheduled to have angiography with Dr. Gwenlyn Found on 04/12/18. This was however canceled due to the fact that the patient was found to have chronic kidney disease stage III and it was to the point that he did not feel that it will be safe to pursue angiography at that point. She has not been on any antibiotics recently. At this point Dr. Gwenlyn Found has not rescheduled anything as far as the injured [place] according to the patient he is somewhat reluctant to do so. Nonetheless with her diminished blood flow this is gonna make it somewhat difficult for her to heal. 07/05/18; this is a patient I have not seen previously. She has very significant PAD as noted above. She apparently has had revascularization efforts by Dr. Gwenlyn Found on the right on 3 occasions to the patient. She was supposed to have an attempted angiography on the left however this was canceled apparently because of stage III chronic renal failure. I will need to research all of this. She complains of significant pain in the wound and has claudication enough that when she walks to the end of the driveway she  has to stop. She is been using silver alginate to the wounds on her legs and Iodoflex to the area on her left second toe. 07/13/18 on evaluation today patient's wounds actually appear to be doing about the same. She has an appointment she tells me within the next month that is September 2019 with Dr. Gwenlyn Found to discuss options to see if there's anything else that you can recommend or do for her. Nonetheless obviously what we're trying to do is do what we can to save her leg and in turn prevent any additional worsening or damage. None in the meantime we been mainly trying to manage her ulcers as best we can. 07/27/18; some improvement in the multiplicity of wounds on her left lower extremity and foot. She's been using silver alginate. I was unable to determine that she actually has an appointment with Dr. Gwenlyn Found. We are checking into this The patient's wound includes Left lateral foot, left plantar heel, her left anterior calf wound looks close to me, left fourth toe is very close to closed and the left medial calf is perhaps the largest open area READMISSION 02/19/2021 This is a now 60 year old woman with type 2 diabetes and continued cigarette smoking. She has known PAD. She was last in this clinic in 2019 at that point with wounds on her left foot. She left in a nonhealed state. On 06/28/2019 I see she had a left femoropopliteal by vein and vascular with an amputation of the fifth ray. These managed to heal over. Her last arterial studies were in February 2021. This showed an absent waveform at the posterior tibial artery on the right a dampened monophasic dorsalis pedis on the right of 0.44. On the  left again the PTA TA was absent her dorsalis pedis was 0.99 triphasic and her great toe pressure was 0.65. This would have been after her revascularization. Once again she tells me that Dr. Gwenlyn Found has done revascularization on the right leg on 3 different occasions. She has a right transmetatarsal amputation  apparently done by Dr. Sharol Given remotely but I do not see a note for these right lower extremity revascularization but I may not of looked back far enough. In any case she says that the she has a wound on the plantar aspect of the right transmetatarsal amputation site there is been there for several months. More recently she fell and had a wound on her right medial lower leg she had 5 sutures placed in the ER and then subsequently she has developed an area on the medial lower leg which was a blister that opened up. Past medical history includes type 2 diabetes, PAD, chronic renal failure, congestive heart failure, right transmetatarsal amputation, left fifth ray amputation, continued tobacco abuse, atrial fibrillation and cirrhosis. We did not attempt an ABI on the right leg today because of pain 02/26/2021; patient we admitted to the clinic last week. She has what looks to be 2 areas on her right medial and right anterior lower leg that look more like venous wounds but she has 1 on the first met head at the base of her right transmet. She has known severe PAD. She complains of a lot of pain although some of this may be neuropathic. Is difficult to exclude a component of claudication. Use silver alginate on the wounds on her legs and Iodoflex on the area on the first met head. She has an appointment with Dr. Gwenlyn Found on 5/25 although I will text him and discussed the situation. She is have poorly controlled diabetic. She has has stage IIIb chronic renal failure 4/22; patient presents for 1 week follow-up. The 2 areas on her right medial and right anterior lower leg appear well-healing. She has been using silver alginate to this area without issues. She has a first met head ulcer and it is unclear how long this has been there as it was discovered in the ED earlier this month when she was being evaluated for another issue. Iodoflex has been used at this area. 4/29; patient presents for 1 week follow-up. She  has been using silver alginate to the leg wounds and Iodoflex to the plantar foot wound. She has had this wrapped with Coban and Kerlix. She has no concerns or complaints today. 5/6; this is a difficult wound on the right plantar foot transmit site. We are not making a lot of progress. She had 2 more venous looking wounds on the right medial and right anterior lower leg 1 of which is healed. Her appointment with Dr. Gwenlyn Found is on 5/25 5/16; she did not tolerate the offloading shoe we gave her in fact she had a fall with an abrasion on her right forearm she is now back in the modified small shoe which does not offload her foot properly. Her appoint with Dr. Gwenlyn Found is still on 5/25 we have been using Iodoflex. The area on the right anterior lower leg is healed 7/18; patient presents for follow-up however has not been seen in 2 months. She was last seen at the end of May. She had a fall at the end of June and was hospitalized. She has been unable to follow-up since then. For the wound she has only been keeping it covered  with gauze. She is scheduled for an aortogram with lower extremity runoff at the end of July. 7/29; patient presents for 2-week follow-up. She has home health and they have been changing her dressings. She denies any issues and has no complaints today. She states she had a procedure where they opened up one of her vessels in her legs. She denies signs of infection. 8/5; patient presents for follow-up. She was recently in the hospital for bradycardia. She was noted to have cellulitis to the left lower extremity and Unna boot was placed due to blisters and increased swelling. She reports improvement to her left leg since being in the hospital and stability check her right plantar foot wound. She denies signs of infection. 8/23; since I last saw this patient a lot has happened. She still has the area over the right first met head in the setting of previous transmetatarsal amputation. Firstly  most importantly she underwent revascularization of her occluded right SFA. She underwent directional atherectomy followed by drug-coated balloon angioplasty. She has no named vessel below the knee hopefully the revascularization of the right SFA will improve collateral flow. It is not felt however that she has any endovascular options. She had follow-up arterial studies noninvasive on 8/9. These showed an ABI of 0.44 at the right PTA. Monophasic waveforms. On the left her great toe ABI was 0.68. Her follow-up arterial Doppler showed a 50 to 74% stenosis in the proximal SFA and a 50 to 74% stenosis in the distal SFA mild to severe atherosclerosis noted throughout the extremity. Areas of shadowing plaque seen, unable to rule out higher grade stenosis she also had a fall apparently wearing a right foot forefoot off loader that we gave her. She therefore comes in in an ordinary running shoe today. Not certain if this is the fall that ended up in the hospital with bradycardia Electronic Signature(s) Signed: 07/07/2021 8:02:04 AM By: Linton Ham MD Entered By: Linton Ham on 07/06/2021 13:03:03 -------------------------------------------------------------------------------- Physical Exam Details Patient Name: Date of Service: Karla Corwin, [name] 07/06/2021 10:15 A M Medical Record Number: [id number] Patient Account Number: [id number] Date of Birth/Sex: Treating RN: 26-Nov-1960 (60 y.o. [name] Primary Care Provider: [place] Other Clinician: Referring Provider: Treating Provider/Extender: Johnn Hai, [name] in Treatment: 19 Constitutional Sitting or standing Blood Pressure is within target range for patient.. Pulse regular and within target range for patient.[name] Respirations regular, non-labored and within target range.. Temperature is normal and within the target range for the patient.[name] Appears in no distress. Notes Wound exam; right foot transmetatarsal  plantar wound I think over the first metatarsal head. This has almost circumferential undermining from roughly 3-10 o'clock. Macerated circumference. I used a #3 curette to debride this and then a #3 curette to remove the very fibrinous surface from the wound bed. Hemostasis really had minimal bleeding by direct pressure Electronic Signature(s) Signed: 07/07/2021 8:02:04 AM By: Linton Ham MD Entered By: Linton Ham on 07/06/2021 12:56:24 -------------------------------------------------------------------------------- Physician Orders Details Patient Name: Date of Service: Karla Corwin, [name] 07/06/2021 10:15 A M Medical Record Number: [id number] Patient Account Number: [id number] Date of Birth/Sex: Treating RN: 15-Aug-1961 (60 y.o. [name] Primary Care Provider: [place] Other Clinician: Referring Provider: Treating Provider/Extender: Johnn Hai, Jairo Ben in Treatment: 19 Verbal / Phone Orders: No Diagnosis Coding ICD-10 Coding Code Description 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 2 diabetes mellitus with foot ulcer S91.301D Unspecified open wound, right foot, subsequent encounter E11.51 Type 2 diabetes mellitus with diabetic  peripheral angiopathy without gangrene S81.802A Unspecified open wound, left lower leg, initial encounter I87.2 Venous insufficiency (chronic) (peripheral) Follow-up Appointments ppointment in 2 weeks. - Dr. Dellia Nims Return A Bathing/ Shower/ Hygiene May shower with protection but do not get wound dressing(s) wet. Edema Control - Lymphedema / SCD / Other Elevate legs to the level of the heart or above for 30 minutes daily and/or when sitting, a frequency of: - throughout the day Avoid standing for long periods of time. Patient to wear own compression stockings every day. - to left leg now Exercise regularly Off-Loading Open toe surgical shoe to: - with felt padding to offload Additional Orders / Instructions Stop/Decrease Smoking Follow  [place] wound care orders this week; continue [place] for wound care. May utilize formulary equivalent dressing for wound treatment orders unless otherwise specified. Other Home Health Orders/Instructions: - [facility] Wound Treatment Wound #16 - Amputation Site - Transmetatarsal Wound Laterality: Plantar, Right Cleanser: Soap and Water (Home Health) 2 x Per Week/15 Days Discharge Instructions: May shower and wash wound with dial antibacterial soap and water prior to dressing change. Cleanser: Wound Cleanser (Home Health) 2 x Per Week/15 Days Discharge Instructions: Cleanse the wound with wound cleanser or normal saline prior to applying a clean dressing using gauze sponges, not tissue or cotton balls. Peri-Wound Care: Sween Lotion (Moisturizing lotion) (Home Health) 2 x Per Week/15 Days Discharge Instructions: Apply moisturizing lotion as directed Prim Dressing: Hydrofera Blue Classic Foam, 2x2 in (Home Health) 2 x Per Week/15 Days ary Discharge Instructions: Moisten with saline prior to applying to wound bed Secondary Dressing: Woven Gauze Sponge, Non-Sterile 4x4 in (Home Health) 2 x Per Week/15 Days Discharge Instructions: Apply over primary dressing as directed. Secondary Dressing: Optifoam Non-Adhesive Dressing, 4x4 in (Home Health) 2 x Per Week/15 Days Discharge Instructions: Foam donut to help offload Compression Wrap: Kerlix Roll 4.5x3.1 (in/yd) (Home Health) 2 x Per Week/15 Days Discharge Instructions: Apply Kerlix and Coban compression as directed. Compression Wrap: Coban Self-Adherent Wrap 4x5 (in/yd) (Home Health) 2 x Per Week/15 Days Discharge Instructions: Apply over Kerlix as directed. Electronic Signature(s) Signed: 07/06/2021 5:31:05 PM By: [name] RN Signed: 07/07/2021 8:02:04 AM By: Linton Ham MD Entered By: [name] on 07/06/2021  11:22:08 -------------------------------------------------------------------------------- Problem List Details Patient Name: Date of Service: Karla Corwin, [name] 07/06/2021 10:15 A M Medical Record Number: [id number] Patient Account Number: [id number] Date of Birth/Sex: Treating RN: 07-Feb-1961 (60 y.o. [name] Primary Care Provider: [place] [place] Other Clinician: Referring Provider: Treating Provider/Extender: Johnn Hai, Jairo Ben in Treatment: 19 Active Problems ICD-10 Encounter Code Description Active Date MDM Diagnosis S91.301D Unspecified open wound, right foot, subsequent encounter 05/31/2021 No Yes 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 2 diabetes mellitus with foot ulcer 02/19/2021 No Yes E11.51 Type 2 diabetes mellitus with diabetic peripheral angiopathy without gangrene 02/19/2021 No Yes S81.802A Unspecified open wound, left lower leg, initial encounter 06/18/2021 No Yes I87.2 Venous insufficiency (chronic) (peripheral) 06/18/2021 No Yes Inactive Problems ICD-10 Code Description Active Date Inactive Date L97.811 Non-pressure chronic ulcer of other part of right lower leg limited to breakdown of skin 02/19/2021 02/19/2021 Resolved Problems ICD-10 Code Description Active Date Resolved Date S40.811D Abrasion of right upper arm, subsequent encounter 03/29/2021 03/29/2021 Electronic Signature(s) Signed: 07/07/2021 8:02:04 AM By: Linton Ham MD Entered By: Linton Ham on 07/06/2021 12:50:30 -------------------------------------------------------------------------------- Progress Note Details Patient Name: Date of Service: Durward Parcel S. 07/06/2021 10:15 A M Medical Record Number: [id number] Patient Account Number: [id number] Date of  Birth/Sex: Treating RN: [birth year] (60 y.o. [name] Primary Care Provider: [place] [place] Other Clinician: Referring Provider: Treating Provider/Extender: Johnn Hai, [name] in Treatment: 19 Subjective History  of Present Illness (HPI) This 60 year old patient who has a very long significant history of diabetes mellitus, previous alcohol and nicotine abuse, chronic data disease, COPD, diabetes mellitus, height hypertension, critical lower limb ischemia with several wounds being managed at the wound Center at [facility] for over a year. She was recently in the ER at [facility] and was referred to our center. He has had a long history of critical limb ischemia and over a period of time has had balloon angioplasties in March 2016, endarterectomy of the left carotid, lower extremity angiogram and treatment by Dr. Quay Burow, several cardiac catheterizations. Most recently she had an x-ray of her left foot while in the ER which showed no acute abnormality. during this ER visit she was started on ciprofloxacin and asked to continue with the wound care physicians at [facility]. Her last ABI done in June 2017 showed the right side to be 0.28 in the left side to be 0.48. Her right toe brachial index was 0.17 on the right and 0.27 on the left. her last hemoglobin A1c was 11.1% She continues to smoke about a pack of cigarettes a day. 03/28/2017 -- -- right foot x-ray -- IMPRESSION:Areas of soft tissue swelling. Mild subluxation second PIP joint. No frank dislocation or fracture. No erosive change or bony destruction. No soft tissue ulceration or radiopaque foreign body evident by radiography. There is plantar fascia calcification with a nearby inferior calcaneal spur. There are foci of arterial vascular calcification. 04/04/2017 -- the patient continues to be noncompliant and continues to complain of a lot of pain and has not done anything about quitting smoking Readmission: 06/26/18 on evaluation today patient presents for reevaluation she has not been seen in our office since May 2018. Since she was last seen here in our office she has undergone testing at Dr. Fransico Christalynn [place] office where it was  revealed that she had an ABI of 0.68 with her previous ABI being 0.64 and a left ABI of 0.38 with previous ABI being 0.34 this study which was performed on 04/05/18 was pretty much equivalent to the study performed on 11/22/17. The findings in the end revealed on the right would appear to be moderate right lower extremity arterial disease on the left Dr. Gwenlyn Found states moderate in the report but unfortunately this appears to be much more severe at 0.38 compared to the right. Subsequently the patient was scheduled to have angiography with Dr. Gwenlyn Found on 04/12/18. This was however canceled due to the fact that the patient was found to have chronic kidney disease stage III and it was to the point that he did not feel that it will be safe to pursue angiography at that point. She has not been on any antibiotics recently. At this point Dr. Gwenlyn Found has not rescheduled anything as far as the injured [place] according to the patient he is somewhat reluctant to do so. Nonetheless with her diminished blood flow this is gonna make it somewhat difficult for her to heal. 07/05/18; this is a patient I have not seen previously. She has very significant PAD as noted above. She apparently has had revascularization efforts by Dr. Gwenlyn Found on the right on 3 occasions to the patient. She was supposed to have an attempted angiography on the left however this was canceled apparently because of  stage III chronic renal failure. I will need to research all of this. She complains of significant pain in the wound and has claudication enough that when she walks to the end of the driveway she has to stop. She is been using silver alginate to the wounds on her legs and Iodoflex to the area on her left second toe. 07/13/18 on evaluation today patient's wounds actually appear to be doing about the same. She has an appointment she tells me within the next month that is September 2019 with Dr. Gwenlyn Found to discuss options to see if there's anything else  that you can recommend or do for her. Nonetheless obviously what we're trying to do is do what we can to save her leg and in turn prevent any additional worsening or damage. None in the meantime we been mainly trying to manage her ulcers as best we can. 07/27/18; some improvement in the multiplicity of wounds on her left lower extremity and foot. She's been using silver alginate. I was unable to determine that she actually has an appointment with Dr. Gwenlyn Found. We are checking into this The patient's wound includes Left lateral foot, left plantar heel, her left anterior calf wound looks close to me, left fourth toe is very close to closed and the left medial calf is perhaps the largest open area READMISSION 02/19/2021 This is a now 60 year old woman with type 2 diabetes and continued cigarette smoking. She has known PAD. She was last in this clinic in 2019 at that point with wounds on her left foot. She left in a nonhealed state. On 06/28/2019 I see she had a left femoropopliteal by vein and vascular with an amputation of the fifth ray. These managed to heal over. Her last arterial studies were in February 2021. This showed an absent waveform at the posterior tibial artery on the right a dampened monophasic dorsalis pedis on the right of 0.44. On the left again the PTA TA was absent her dorsalis pedis was 0.99 triphasic and her great toe pressure was 0.65. This would have been after her revascularization. Once again she tells me that Dr. Gwenlyn Found has done revascularization on the right leg on 3 different occasions. She has a right transmetatarsal amputation apparently done by Dr. Sharol Given remotely but I do not see a note for these right lower extremity revascularization but I may not of looked back far enough. In any case she says that the she has a wound on the plantar aspect of the right transmetatarsal amputation site there is been there for several months. More recently she fell and had a wound on her right  medial lower leg she had 5 sutures placed in the ER and then subsequently she has developed an area on the medial lower leg which was a blister that opened up. Past medical history includes type 2 diabetes, PAD, chronic renal failure, congestive heart failure, right transmetatarsal amputation, left fifth ray amputation, continued tobacco abuse, atrial fibrillation and cirrhosis. We did not attempt an ABI on the right leg today because of pain 02/26/2021; patient we admitted to the clinic last week. She has what looks to be 2 areas on her right medial and right anterior lower leg that look more like venous wounds but she has 1 on the first met head at the base of her right transmet. She has known severe PAD. She complains of a lot of pain although some of this may be neuropathic. Is difficult to exclude a component of claudication. Use silver  alginate on the wounds on her legs and Iodoflex on the area on the first met head. She has an appointment with Dr. Gwenlyn Found on 5/25 although I will text him and discussed the situation. She is have poorly controlled diabetic. She has has stage IIIb chronic renal failure 4/22; patient presents for 1 week follow-up. The 2 areas on her right medial and right anterior lower leg appear well-healing. She has been using silver alginate to this area without issues. She has a first met head ulcer and it is unclear how long this has been there as it was discovered in the ED earlier this month when she was being evaluated for another issue. Iodoflex has been used at this area. 4/29; patient presents for 1 week follow-up. She has been using silver alginate to the leg wounds and Iodoflex to the plantar foot wound. She has had this wrapped with Coban and Kerlix. She has no concerns or complaints today. 5/6; this is a difficult wound on the right plantar foot transmit site. We are not making a lot of progress. She had 2 more venous looking wounds on the right medial and right  anterior lower leg 1 of which is healed. Her appointment with Dr. Gwenlyn Found is on 5/25 5/16; she did not tolerate the offloading shoe we gave her in fact she had a fall with an abrasion on her right forearm she is now back in the modified small shoe which does not offload her foot properly. Her appoint with Dr. Gwenlyn Found is still on 5/25 we have been using Iodoflex. The area on the right anterior lower leg is healed 7/18; patient presents for follow-up however has not been seen in 2 months. She was last seen at the end of May. She had a fall at the end of June and was hospitalized. She has been unable to follow-up since then. For the wound she has only been keeping it covered with gauze. She is scheduled for an aortogram with lower extremity runoff at the end of July. 7/29; patient presents for 2-week follow-up. She has home health and they have been changing her dressings. She denies any issues and has no complaints today. She states she had a procedure where they opened up one of her vessels in her legs. She denies signs of infection. 8/5; patient presents for follow-up. She was recently in the hospital for bradycardia. She was noted to have cellulitis to the left lower extremity and Unna boot was placed due to blisters and increased swelling. She reports improvement to her left leg since being in the hospital and stability check her right plantar foot wound. She denies signs of infection. 8/23; since I last saw this patient a lot has happened. She still has the area over the right first met head in the setting of previous transmetatarsal amputation. Firstly most importantly she underwent revascularization of her occluded right SFA. She underwent directional atherectomy followed by drug-coated balloon angioplasty. She has no named vessel below the knee hopefully the revascularization of the right SFA will improve collateral flow. It is not felt however that she has any endovascular options. She had  follow-up arterial studies noninvasive on 8/9. These showed an ABI of 0.44 at the right PTA. Monophasic waveforms. On the left her great toe ABI was 0.68. Her follow-up arterial Doppler showed a 50 to 74% stenosis in the proximal SFA and a 50 to 74% stenosis in the distal SFA mild to severe atherosclerosis noted throughout the extremity. Areas of shadowing plaque  seen, unable to rule out higher grade stenosis she also had a fall apparently wearing a right foot forefoot off loader that we gave her. She therefore comes in in an ordinary running shoe today. Not certain if this is the fall that ended up in the hospital with bradycardia Objective Constitutional Sitting or standing Blood Pressure is within target range for patient.. Pulse regular and within target range for patient.[name] Respirations regular, non-labored and within target range.. Temperature is normal and within the target range for the patient.[name] Appears in no distress. Vitals Time Taken: 10:48 AM, Height: 64 in, Source: Stated, Weight: 222 lbs, Source: Stated, BMI: 38.1, Temperature: 98.8 F, Pulse: 90 bpm, Respiratory Rate: 22 breaths/min, Blood Pressure: 125/72 mmHg, Capillary Blood Glucose: 202 mg/dl. General Notes: glucose per pt report this am General Notes: Wound exam; right foot transmetatarsal plantar wound I think over the first metatarsal head. This has almost circumferential undermining from roughly 3-10 o'clock. Macerated circumference. I used a #3 curette to debride this and then a #3 curette to remove the very fibrinous surface from the wound bed. Hemostasis really had minimal bleeding by direct pressure Integumentary (Hair, Skin) Wound #16 status is Open. Original cause of wound was Gradually Appeared. The date acquired was: 02/05/2021. The wound has been in treatment 19 weeks. The wound is located on the Right,Plantar Amputation Site - Transmetatarsal. The wound measures 1.1cm length x 1.6cm width x 0.9cm depth; 1.382cm^2  area and 1.244cm^3 volume. There is Fat Layer (Subcutaneous Tissue) exposed. There is no tunneling noted, however, there is undermining starting at 9:00 and ending at 4:00 with a maximum distance of 0.9cm. There is a medium amount of serous drainage noted. The wound margin is thickened. There is large (67- 100%) red granulation within the wound bed. There is no necrotic tissue within the wound bed. General Notes: macerated periwound Wound #20 status is Healed - Epithelialized. Original cause of wound was Blister. The date acquired was: 06/14/2021. The wound has been in treatment 2 weeks. The wound is located on the Left,Anterior Lower Leg. The wound measures 0cm length x 0cm width x 0cm depth; 0cm^2 area and 0cm^3 volume. There is no tunneling or undermining noted. There is a none present amount of drainage noted. There is no granulation within the wound bed. There is no necrotic tissue within the wound bed. Assessment Active Problems ICD-10 Unspecified open wound, right foot, subsequent encounter Type 2 diabetes mellitus with foot ulcer Type 2 diabetes mellitus with diabetic peripheral angiopathy without gangrene Unspecified open wound, left lower leg, initial encounter Venous insufficiency (chronic) (peripheral) Procedures Wound #16 Pre-procedure diagnosis of Wound #16 is a Diabetic Wound/Ulcer of the Lower Extremity located on the Right,Plantar Amputation Site - Transmetatarsal .Severity of Tissue Pre Debridement is: Fat layer exposed. There was a Excisional Skin/Subcutaneous Tissue Debridement with a total area of 1.76 sq cm performed by Ricard Dillon., MD. With the following instrument(s): Curette to remove Viable and Non-Viable tissue/material. Material removed includes Callus, Subcutaneous Tissue, Slough, Skin: Dermis, and Skin: Epidermis after achieving pain control using Lidocaine. No specimens were taken. A time out was conducted at 11:17, prior to the start of the procedure. A  Minimum amount of bleeding was controlled with Pressure. The procedure was tolerated well with a pain level of 0 throughout and a pain level of 0 following the procedure. Post Debridement Measurements: 1.1cm length x 1.6cm width x 0.9cm depth; 1.244cm^3 volume. Character of Wound/Ulcer Post Debridement is improved. Severity of Tissue Post Debridement is:  Fat layer exposed. Post procedure Diagnosis Wound #16: Same as Pre-Procedure Plan Follow-up Appointments: Return Appointment in 2 weeks. - Dr. Dellia Nims Bathing/ Shower/ Hygiene: May shower with protection but do not get wound dressing(s) wet. Edema Control - Lymphedema / SCD / Other: Elevate legs to the level of the heart or above for 30 minutes daily and/or when sitting, a frequency of: - throughout the day Avoid standing for long periods of time. Patient to wear own compression stockings every day. - to left leg now Exercise regularly Off-Loading: Open toe surgical shoe to: - with felt padding to offload Additional Orders / Instructions: Stop/Decrease Smoking Follow Nutritious Diet Home Health: New wound care orders this week; continue Home Health for wound care. May utilize formulary equivalent dressing for wound treatment orders unless otherwise specified. Other Home Health Orders/Instructions: - TYOMAYO WOUND #16: - Amputation Site - Transmetatarsal Wound Laterality: Plantar, Right Cleanser: Soap and Water (Home Health) 2 x Per Week/15 Days Discharge Instructions: May shower and wash wound with dial antibacterial soap and water prior to dressing change. Cleanser: Wound Cleanser (Home Health) 2 x Per Week/15 Days Discharge Instructions: Cleanse the wound with wound cleanser or normal saline prior to applying a clean dressing using gauze sponges, not tissue or cotton balls. Peri-Wound Care: Sween Lotion (Moisturizing lotion) (Home Health) 2 x Per Week/15 Days Discharge Instructions: Apply moisturizing lotion as directed Prim  Dressing: Hydrofera Blue Classic Foam, 2x2 in (Home Health) 2 x Per Week/15 Days ary Discharge Instructions: Moisten with saline prior to applying to wound bed Secondary Dressing: Woven Gauze Sponge, Non-Sterile 4x4 in (Home Health) 2 x Per Week/15 Days Discharge Instructions: Apply over primary dressing as directed. Secondary Dressing: Optifoam Non-Adhesive Dressing, 4x4 in (Home Health) 2 x Per Week/15 Days Discharge Instructions: Foam donut to help offload Com pression Wrap: Kerlix Roll 4.5x3.1 (in/yd) (Home Health) 2 x Per Week/15 Days Discharge Instructions: Apply Kerlix and Coban compression as directed. Com pression Wrap: Coban Self-Adherent Wrap 4x5 (in/yd) (Home Health) 2 x Per Week/15 Days Discharge Instructions: Apply over Kerlix as directed. 1. Debridement as noted. 2. Change the dressing to Hydrofera Blue covered in a nonadhering dressing 3. I believe she is a continued cigarette smoker I will need to talk to her about this next week or next visit. 4. I do not believe she is adequately offloading this or even offloading it at all. I did talk to her although the concern about her "fall" was concerning. Apparently she ended up in hospital for 2 days although I wonder if this was mediated by bradycardia. I will need to check this next time 5. I have talked to her about a total contact cast. She has a walker. She promised to bring this in next week. 6. She does not have any further arterial options. In fact she has no named vessel below her knee. Her noninvasive studies did not show a lot of improvement post her endovascular revascularization of the right SFA. She is at extreme risk of limb loss on the right lower extremity Electronic Signature(s) Signed: 07/06/2021 1:04:21 PM By: Linton Ham MD Entered By: Linton Ham on 07/06/2021 13:04:21 -------------------------------------------------------------------------------- SuperBill Details Patient Name: Date of Service: [employer]ward [employer] S. 07/06/2021 Medical Record Number: [id number] Patient Account Number: [id number] Date of Birth/Sex: Treating RN: 1961/03/30 (61 y.o. [name] Primary Care Provider: Ault Other Clinician: Referring Provider: Treating Provider/Extender: Johnn Hai, [name] in Treatment: 19 Diagnosis Coding ICD-10 Codes Code Description 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 2  diabetes mellitus with foot ulcer S91.301D Unspecified open wound, right foot, subsequent encounter E11.51 Type 2 diabetes mellitus with diabetic peripheral angiopathy without gangrene S81.802A Unspecified open wound, left lower leg, initial encounter I87.2 Venous insufficiency (chronic) (peripheral) Facility Procedures CPT4 Code: [id number] Description: 20266 - DEB SUBQ TISSUE 20 SQ CM/< ICD-10 Diagnosis Description 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 2 diabetes mellitus with foot ulcer S91.301D Unspecified open wound, right foot, subsequent encounter Modifier: Quantity: 1 Physician Procedures : CPT4 Code Description Modifier [id number] 11042 - WC PHYS SUBQ TISS 20 SQ CM 1 ICD-10 Diagnosis Description 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 2 diabetes mellitus with foot ulcer S91.301D Unspecified open wound, right foot, subsequent encounter Quantity: Electronic Signature(s) Signed: 07/07/2021 8:02:04 AM By: Linton Ham MD Entered By: Linton Ham on 07/06/2021 13:00:56

## 2021-07-08 ENCOUNTER — Other Ambulatory Visit (HOSPITAL_COMMUNITY): Payer: Self-pay

## 2021-07-08 NOTE — Progress Notes (Signed)
Paramedicine Encounter    Patient ID: [name], female    DOB: 1961-02-11, 60 y.o.   MRN: [id number]   Patient Care Team: [facility], [place] as PCP - General Gwenlyn Found, [name], MD as PCP - Cardiology (Cardiology) [name], MD as PCP - Electrophysiology (Cardiology) [name], MD as Consulting Physician (Cardiology) [name], MD as Consulting Physician (Pulmonary Disease) System, Provider Not In  Patient Active Problem List   Diagnosis Date Noted   Bradycardia 06/13/2021   Uncontrolled type 2 diabetes mellitus with hyperglycemia ([place]) 06/13/2021   Acute kidney injury superimposed on CKD ([place]) 06/13/2021   Critical ischemia of foot ([place]) 06/07/2021   Fall 05/05/2021   PAF (paroxysmal atrial fibrillation) ([place]) 05/05/2021   COPD (chronic obstructive pulmonary disease) ([place]) 05/05/2021   DM (diabetes mellitus), type 2 with complications ([place]) 99991111   PAD (peripheral artery disease) ([place]) 05/05/2021   Cellulitis 05/05/2021   Acute on chronic combined systolic and diastolic CHF (congestive heart failure) ([place]) 05/05/2021   OSA (obstructive sleep apnea) 05/05/2021   CKD (chronic kidney disease) stage 3, GFR 30-59 ml/min ([place]) 05/05/2021   AKI (acute kidney injury) ([place]) 05/04/2021   Pressure injury of skin 05/04/2021   Ulcer of foot, unspecified laterality, limited to breakdown of skin ([place]) 02/17/2021   Altered mental status 01/02/2021   AF (paroxysmal atrial fibrillation) ([place]) 01/02/2021   CAD (coronary artery disease) 01/02/2021   PVD (peripheral vascular disease) ([place]) 01/02/2021   Acute renal failure superimposed on stage 4 chronic kidney disease ([place]) 01/02/2021   Diabetes mellitus type 2, uncontrolled, with complications ([place]) [id number]   Diabetic nephropathy ([place]) 01/02/2021   Low back pain 06/01/2020   Hyperlipidemia 04/03/2020   Muscle weakness 03/20/2020   Closed fracture of neck of right radius 03/13/2020   Pain in elbow  03/12/2020   PAD (peripheral artery disease) ([place]) 12/26/2019   Acute renal failure ([name])    Acute respiratory failure with hypoxia ([place]) 12/02/2019   Acute exacerbation of CHF (congestive heart failure) ([place]) 12/01/2019   Cellulitis in diabetic foot ([place]) 07/08/2019   Osteomyelitis ([place]) 06/21/2019   NICM (nonischemic cardiomyopathy) ([place]) 06/20/2019   Non-healing ulcer ([place]) 06/20/2019   Acute CHF (congestive heart failure) ([place]) 11/26/2018   CHF (congestive heart failure) ([place]) 11/26/2018   Acute respiratory failure ([place]) 10/21/2018   AKI (acute kidney injury) ([place]) 08/18/2018   Coagulation disorder ([place]) 08/09/2017   Depression 07/21/2017   At risk for adverse drug reaction 06/20/2017   Peripheral neuropathy 06/20/2017   Acute osteomyelitis of right foot ([place]) 06/13/2017   S/P transmetatarsal amputation of foot, right ([place]) 06/05/2017   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 ([address]) 05/19/2017   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[place])    SIRS (systemic inflammatory response syndrome) ([place]) 04/06/2017   CKD (chronic kidney disease), stage III ([place]) 11/24/2016   Suspected sleep apnea 11/24/2016   Ulcer of toe of right foot, with necrosis of bone ([place]) 10/27/2016   Ulcer of left lower leg ([place]) 05/19/2016   Severe obesity (BMI >= 40) ([place]) 02/24/2016   COPD GOLD 0  02/24/2016   Morbid obesity ([place]) 02/24/2016   Encounter for therapeutic drug monitoring 02/10/2016   Symptomatic bradycardia 01/12/2016   Essential hypertension 12/22/2015   Chronic combined systolic and diastolic CHF (congestive heart failure) ([place])    [place]    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 11/08/2015   Tobacco abuse 10/23/2015   Coronary artery disease  DOE (dyspnea on exertion) 04/29/2015   Diabetes mellitus type 2, uncontrolled ([place]) 02/08/2015   Influenza A 02/07/2015   Wrist fracture, left, with routine healing, subsequent encounter 02/05/2015   Wrist fracture,  left, closed, initial encounter 01/29/2015   PAF (paroxysmal atrial fibrillation) ([place]) 01/16/2015   Carotid arterial disease ([place]) 01/16/2015   Claudication ([place]) 01/15/2015   Demand ischemia ([place]) 10/29/2014   Insomnia 02/03/2014   COPD with acute exacerbation ([place]) 02/01/2014   S/P peripheral artery angioplasty - [place] atherectomy; R SFA 09/11/2013    Class: Acute   Leg pain, bilateral 08/19/2013   Hypothyroidism 07/31/2013   Cellulitis 06/13/2013   History of cocaine abuse ([place]) 06/13/2013   Long term current use of anticoagulant therapy 05/20/2013   Alcohol abuse    Narcotic abuse ([place])    Marijuana abuse    Alcoholic cirrhosis ([place])    DM (diabetes mellitus), type 2 with peripheral vascular complications ([name])     Current Outpatient Medications:    ACCU-CHEK GUIDE test strip, USE TO CHECK BLOOD SUGAR FOUR TIMES DAILY, Disp: , Rfl:    acetaminophen (TYLENOL) 500 MG tablet, Take 500 mg by mouth every 6 (six) hours as needed for moderate pain., Disp: , Rfl:    albuterol (VENTOLIN HFA) 108 (90 Base) MCG/ACT inhaler, Inhale 2 puffs into the lungs every 6 (six) hours as needed for wheezing or shortness of breath., Disp: 18 g, Rfl: 0   amiodarone (PACERONE) 200 MG tablet, Take 0.5 tablets (100 mg total) by mouth daily., Disp: 45 tablet, Rfl: 3   amLODipine (NORVASC) 10 MG tablet, Take 1 tablet (10 mg total) by mouth daily., Disp: 90 tablet, Rfl: 3   apixaban (ELIQUIS) 5 MG TABS tablet, Take 5 mg by mouth 2 (two) times daily., Disp: , Rfl:    atorvastatin (LIPITOR) 40 MG tablet, Take 40 mg by mouth every evening., Disp: , Rfl:    budesonide-formoterol (SYMBICORT) 160-4.5 MCG/ACT inhaler, Inhale 2 puffs into the lungs 2 (two) times daily., Disp: 1 Inhaler, Rfl: 2   clopidogrel (PLAVIX) 75 MG tablet, Take 1 tablet (75 mg total) by mouth daily with breakfast., Disp: 90 tablet, Rfl: 1   colchicine 0.6 MG tablet, Take 0.6 mg by mouth daily., Disp: , Rfl:    Continuous Blood Gluc Sensor  (FREESTYLE LIBRE 2 SENSOR) MISC, USE 1 (ONE) EACH EVERY 2 WEEKS, Disp: , Rfl:    Dulaglutide (TRULICITY) A999333 0000000 SOPN, Inject 0.75 mg into the skin every Friday., Disp: , Rfl:    empagliflozin (JARDIANCE) 10 MG TABS tablet, Take 1 tablet (10 mg total) by mouth daily before breakfast., Disp: 90 tablet, Rfl: 3   FLUoxetine (PROZAC) 10 MG capsule, Take 1 capsule (10 mg total) by mouth at bedtime., Disp: 30 capsule, Rfl: 0   folic acid (FOLVITE) 1 MG tablet, Take 1 mg by mouth daily., Disp: , Rfl:    hydrALAZINE (APRESOLINE) 25 MG tablet, Take 1 tablet (25 mg total) by mouth 3 (three) times daily., Disp: 90 tablet, Rfl: 11   insulin glargine (LANTUS) 100 UNIT/ML Solostar Pen, INJECT 20 UNITS INTO THE SKIN DAILY., Disp: 15 mL, Rfl: 0   insulin lispro (HUMALOG) 100 UNIT/ML KwikPen, Inject 8 Units into the skin 3 (three) times daily with meals., Disp: 15 mL, Rfl: 0   Insulin Pen Needle 32G X 4 MM MISC, USE AS DIRECTED WITH INSULIN PENS, Disp: 100 each, Rfl: 0   levothyroxine (SYNTHROID, LEVOTHROID) 50 MCG tablet, Take 1 tablet (50 mcg total) by mouth  daily., Disp: 30 tablet, Rfl: 0   losartan (COZAAR) 100 MG tablet, Take 1 tablet (100 mg total) by mouth daily., Disp: 30 tablet, Rfl: 6   nortriptyline (PAMELOR) 50 MG capsule, Take 50 mg by mouth 2 (two) times daily., Disp: , Rfl:    Omega-3 Fatty Acids (FISH OIL PO), Take 1 capsule by mouth daily., Disp: , Rfl:    pantoprazole (PROTONIX) 40 MG tablet, Take 1 tablet (40 mg total) by mouth daily., Disp:  , Rfl:    spironolactone (ALDACTONE) 25 MG tablet, Take 1 tablet (25 mg total) by mouth daily., Disp: 30 tablet, Rfl: 11   torsemide (DEMADEX) 20 MG tablet, Take 4 tablets (80 mg total) by mouth every morning AND 3 tablets (60 mg total) every evening., Disp: 210 tablet, Rfl: 11   VITAMIN D PO, Take 1 tablet by mouth daily., Disp: , Rfl:  Allergies  Allergen Reactions   Gabapentin Nausea And Vomiting and Other (See Comments)    POSSIBLE SHAKING    Lyrica [Pregabalin] Other (See Comments)    Shaking       Social History   Socioeconomic History   Marital status: Single    Spouse name: Not on file   Number of children: 1   Years of education: 12   Highest education level: 12th grade  Occupational History   Occupation: disabled  Tobacco Use   Smoking status: Every Day    Packs/day: 1.00    Years: 44.00    Pack years: 44.00    Types: E-cigarettes, Cigarettes   Smokeless tobacco: Former    Types: Snuff  Vaping Use   Vaping Use: Former   Devices: 11/26/2018 "stopped months ago"  Substance and Sexual Activity   Alcohol use: Yes    Comment: occ   Drug use: Yes    Types: "Crack" cocaine, Marijuana, Oxycodone   Sexual activity: Not Currently  Other Topics Concern   Not on file  Social History Narrative   ** Merged History Encounter **       Lives in [place], in motel with sister.  They are looking to move but don't have a place to go yet.     Social Determinants of Health   Financial Resource Strain: Low Risk    Difficulty of Paying Living Expenses: Not very hard  Food Insecurity: Not on file  Transportation Needs: Not on file  Physical Activity: Not on file  Stress: Not on file  Social Connections: Not on file  Intimate Partner Violence: Not on file    Physical Exam      Future Appointments  Date Time Provider [place]  07/13/2021  2:30 PM [facility] PA/NP [facility] None  07/14/2021  9:30 AM Charlott Rakes, MD [facility] None  07/20/2021  2:15 PM Ricard Dillon, MD [facility]  10/01/2021  2:30 PM [name], MD [facility]  12/22/2021  1:00 PM MC[facility]CV NL VASC 3 [facility]SE[facility]I [facility]    BP 130/72   Pulse 90   Resp 18   LMP  (LMP Unknown)   SpO2 94%  CBG EMS-332 Weight yesterday-? Last visit weight-231  Pt home from the beach, pt reports she hasnt been feeling well, she has a cough, (worse than usual) non productive, a sore throat for the past 10 days, and just feels weak. She had a home  test for COVID so we did that but it was negative. She denies c/p, no increased sob, using her inhalers, no dizziness, her cbgs remain out of  control.  She has clinic appointment next week along with a PCP appointment.  My computer was down during our visit, when I returned to office I tried calling her but no answer. Also tried calling her sister as well, no answer. Lungs sound wheezy as usual.  Will try to reach her Monday again to let her know of the appointments.   She is missing the fluoxetine-she needs refills on the losartan, folic acid and nortriptyline as well.   Meds verified and 2 wks of pill box refilled.  Will see her in clinic next week.    Marylouise Stacks, [facility] Paramedic  07/08/21

## 2021-07-12 ENCOUNTER — Telehealth (HOSPITAL_COMMUNITY): Payer: Self-pay

## 2021-07-12 NOTE — Progress Notes (Signed)
Patient ID: [name], female   DOB: 09-23-1961, 60 y.o.   MRN: [id number]    Advanced Heart Failure Clinic Note   PCP: [facility] PV: Dr. Gwenlyn Found HF Cardiology: Dr. Aundra Dubin  Ms Amon is a 60 y.o. with history of PAD, carotid stenosis s/p left CEA, relatively mild CAD, chronic systolic CHF, paroxysmal atrial fibrillation and prior substance abuse presents for CHF clinic followup.  She was admitted in 1/17 with acute hypoxemic respiratory failure in the setting of atrial fibrillation/RVR and volume overload.  She was initially intubated.  She converted back to NSR with amiodarone gtt.  She was treated with IV Lasix, steroids, bronchodilators.  She developed AKI and losartan was stopped.    She was admitted in 3/17 with symptomatic bradycardia.  She was supposed to stop diltiazem after this admission but continued to take it.  She presented later in 3/17, again with symptomatic bradycardia (junctional bradycardia), hypotension, and AKI.  Diltiazem and bisoprolol were stopped.  HR recovered and she has had no problems since.  ACEI was stopped with AKI.   In 5/18, she had peripheral angiography with atherectomy of right SFA.  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 7/18 ABIs showed 0.65 on right, 0.31 on left.  In 8/20, she had peripheral angiography showing totally occluded left SFA.  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 Donnetta Hutching.  ABIs in 2/21 were 0.44 on right, 0.99 on left.   Echo in 1/21 showed EF 50%, mild LVH, normal RV, PASP 60 mmHg.   Echo 03/08/21 EF 55-60% with basal to mid inferolateral hypokinesis.   Admitted 6/22 for mechanical fall, found to have AKI on CKD. She was given IV lasix and her losartan was held.  Underwent PV angiogram with Dr. Gwenlyn Found 7/22. She had drug-coated balloon angioplasty of the right SFA after being admitted for critical ischemia of the right foot.  Admitted 8/22 for  bradycardia and AKI, bisoprolol subsequently stopped; amiodarone was continued.  Today she returns for HF follow up with paramedicine.   She uses a walker, cane and electric wheelchair at home. She is able to get around her house without much dyspnea; she is limited mainly by her foot pain. Denies CP, dizziness, edema, or PND/Orthopnea. Weight at home 220 pounds. Taking all medications. Smokes 1 ppd, no ETOH use. She is seen in the wound clinic.    ECG (personally reviewed): SR 90 bpm, QTc 526.  Cardiomems: Last reading 07/12/21 showed PADP 19 (goal 20).  Labs (1/17): K 5.2, creatinine 1.4, AST 43, ALT normal Labs (2/17): K 4.2, creatinine 1.3, BNP 607, TSH normal Labs (3/17): K 4.2, creatinine 1.45, AST/ALT normal, HCT 28.7 Labs (4/17): K 4.4, creatinine 1.61 Labs (08/31/2016): K 4.5 Creatinine 1.43  Labs (11/17): K 4.2, creatinine 1.4 Labs (9/18): K 4.1, creatinine 1.1, TSH normal Labs 04/13/2018: K 5 Creatinine 1.75 Labs (2/21): K 4.1, creatinine 1.58 Labs (4/22): K 3.7, creatinine 1.55 Labs (5/22): K 3.8, creatinine 1.54 Labs (8/22): K 4.0, creatinine 2.07  PMH: 1. Carotid stenosis: Known occluded right carotid.  Left CEA in 4/16.  - Carotid dopplers (7/21): CTO RICA, mild disease LICA.  2. CAD: LHC in 12/15 with 80% stenosis in small OM1, nonobstructive disease in other territories.  3. Chronic systolic CHF: Nonischemic cardiomyopathy (?due to ETOH or prior drug abuse).  She has a Cardiomems.  - Echo (1/17) with EF 45%, mild LV hypertrophy,  moderate diastolic dysfunction, inferolateral severe hypokinesis, mildly decreased RV systolic function.  - Echo (1/18): EF 40-45%, mild LVH, normal RV size and systolic function.  - Echo (1/21): EF 50%, mild LVH, normal RV, PASP 60 mmHg.  - Echo (4/22): EF 55-60%, basal-mid inferolateral hypokinesis with grade II diastolic dysfunction, RV normal, PASP 42 4. Atrial fibrillation: Paroxysmal.   5. Type II diabetes 6. CKD: Stage III. 7. COPD:  Smokes 1 ppd.  8. 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  No longer drinks.  9. Hypothyroidism 10. PAD: Atherectomy SFA in 2014 (Dr Gwenlyn Found).  Peripheral arterial dopplers (2/17) with focal 75-99% proximal right SFA stenosis, occluded mid-distal right SFA, chronic occlusion of all runoff arteries on right. - In 5/18, she had peripheral angiography with atherectomy of right SFA.   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  - 7/18 ABIs showed 0.65 on right, 0.31 on left.   - Peripheral angiography 8/20: Totally occluded left SFA.  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 Donnetta Hutching.   - ABIs (2/21): 0.44 R, 0.99 L.  11. Anemia 12. Prior cocaine abuse.  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  14. OSA: Has not been able to tolerate CPAP.   SH: Lives with sister.  Prior cocaine abuse.  Prior ETOH abuse.  Quit smoking in 1/17, restarted, and quit again in 4/19, restarted again.  FH: CAD  Review of systems complete and found to be negative unless listed in HPI.    Current Outpatient Medications  Medication Sig Dispense Refill   ACCU-CHEK GUIDE test strip USE TO CHECK BLOOD SUGAR FOUR TIMES DAILY     acetaminophen (TYLENOL) 500 MG tablet Take 500 mg by mouth every 6 (six) hours as needed for moderate pain.     albuterol (VENTOLIN HFA) 108 (90 Base) MCG/ACT inhaler Inhale 2 puffs into the lungs every 6 (six) hours as needed for wheezing or shortness of breath. 18 g 0   amiodarone (PACERONE) 200 MG tablet Take 0.5 tablets (100 mg total) by mouth daily. 45 tablet 3   amLODipine (NORVASC) 10 MG tablet Take 1 tablet (10 mg total) by mouth daily. 90 tablet 3   apixaban (ELIQUIS) 5 MG TABS tablet Take 5 mg by mouth 2 (two) times daily.     atorvastatin (LIPITOR) 40 MG tablet Take 40 mg by mouth every evening.     budesonide-formoterol (SYMBICORT) 160-4.5 MCG/ACT inhaler Inhale 2 puffs into the lungs 2  (two) times daily. 1 Inhaler 2   clopidogrel (PLAVIX) 75 MG tablet Take 1 tablet (75 mg total) by mouth daily with breakfast. 90 tablet 1   colchicine 0.6 MG tablet Take 0.6 mg by mouth daily.     Continuous Blood Gluc Sensor (FREESTYLE LIBRE 2 SENSOR) MISC USE 1 (ONE) EACH EVERY 2 WEEKS     Dulaglutide (TRULICITY) A999333 0000000 SOPN Inject 0.75 mg into the skin every Friday.     empagliflozin (JARDIANCE) 10 MG TABS tablet Take 1 tablet (10 mg total) by mouth daily before breakfast. 90 tablet 3   FLUoxetine (PROZAC) 10 MG capsule Take 1 capsule (10 mg total) by mouth at bedtime. 30 capsule 0   folic acid (FOLVITE) 1 MG tablet Take 1 mg by mouth daily.     hydrALAZINE (APRESOLINE) 25 MG tablet Take 1 tablet (25 mg total) by mouth 3 (three) times daily. 90 tablet 11   insulin glargine (  LANTUS) 100 UNIT/ML Solostar Pen INJECT 20 UNITS INTO THE SKIN DAILY. 15 mL 0   insulin lispro (HUMALOG) 100 UNIT/ML KwikPen Inject 8 Units into the skin 3 (three) times daily with meals. 15 mL 0   Insulin Pen Needle 32G X 4 MM MISC USE AS DIRECTED WITH INSULIN PENS 100 each 0   levothyroxine (SYNTHROID, LEVOTHROID) 50 MCG tablet Take 1 tablet (50 mcg total) by mouth daily. 30 tablet 0   losartan (COZAAR) 100 MG tablet Take 1 tablet (100 mg total) by mouth daily. 30 tablet 6   nortriptyline (PAMELOR) 50 MG capsule Take 50 mg by mouth 2 (two) times daily.     Omega-3 Fatty Acids (FISH OIL PO) Take 1 capsule by mouth daily.     pantoprazole (PROTONIX) 40 MG tablet Take 1 tablet (40 mg total) by mouth daily.     torsemide (DEMADEX) 20 MG tablet Take 4 tablets (80 mg total) by mouth every morning AND 3 tablets (60 mg total) every evening. 210 tablet 11   VITAMIN D PO Take 1 tablet by mouth daily.     No current facility-administered medications for this encounter.   BP 128/60   Pulse 90   Wt 101.3 kg (223 lb 4 oz)   LMP  (LMP Unknown)   SpO2 95%   BMI 38.32 kg/m   Wt Readings from Last 3 Encounters:  07/13/21  101.3 kg (223 lb 4 oz)  06/25/21 105.5 kg (232 lb 9.6 oz)  06/24/21 104.8 kg (231 lb)    PHYSICAL EXAM: General:  NAD. No resp difficulty, arrived in [facility], chronically-ill appearing. HEENT: Normal Neck: Supple. No JVD. Carotids 2+ bilat; no bruits. No lymphadenopathy or thryomegaly appreciated. Cor: PMI nondisplaced. Regular rate & rhythm. No rubs, gallops or murmurs. Lungs: Clear Abdomen: Obese, nontender, nondistended. No hepatosplenomegaly. No bruits or masses. Good bowel sounds. Extremities: No cyanosis, clubbing, rash, trace LLE edema w/ erythema, no open areas, RLE with unna boot & ortho shoe; s/p R TMA. Neuro: Alert & oriented x 3, cranial nerves grossly intact. Moves all 4 extremities w/o difficulty. Affect pleasant.  Assessment/Plan: 1. Chronic diastolic CHF: LHC in [id number] showing only 80% stenosis in small OM1. EF as low as 40-45% in the past. Echo 4/22 showed EF 55-60% with grade 2 diastolic dysfunction.   She is euvolemic on exam and by Cardiomems.  Not very active, NYHA class II-early III symptoms. She is limited mostly by her foot pain. - Off bisoprolol due to bradycardia. Will not restart today as she had similar episode in 2017.  - Continue torsemide 80 mg q am/60 mg q pm. - Continue losartan 100 mg daily.  - Continue Jardiance 10 mg daily. No GU symptoms. - Advised continued low sodium diet + fluid restriction.  - Appreciate paramedicine assistance. 2. Atrial fibrillation: Paroxysmal. She is SR today on ECG. - Continue amiodarone 100 mg daily. Recent HFTs/ TFTs 4/22 ok.  She is on levothyroxine, followed by PCP.   - Needs regular eye exams. - Continue Eliquis 5 mg bid.  No bleeding. 3. PAD: Claudication bilaterally.  Not candidate for cilostazol with CHF.  She has had a left fem-pop bypass in 8/20.  ABIs in 2/21 were very abnormal on right.  She also has an ulceration on her right foot. She underwent PV angiogram with balloon angioplasty of the right SFA 7/22. - Followup with  Drs Gwenlyn Found and Early for PAD.  - Continue atorvastatin. LDL at goal on Lipid panel 4/22 at 64  mg/dL.  - Continue with Wound Clinic.  4. COPD:  Continues to smoke, encouraged to quit.   5. H/o cirrhosis: Likely from ETOH, she has quit.  6. OSA: Not able to tolerate CPAP.   7. CKD stage III:  BMET today. 8. HTN: Controlled on current regimen.  9.Type 2 diabetes: Per PCP.   10. CAD: LHC in 12/15 with 80% stenosis in small OM1, nonobstructive disease in other territories. No chest pain. - Continue statin.  - Continue Eliquis.  F/u w/ Dr. Aundra Dubin in 2-3 months.  [place], FNP-BC 07/13/2021 5:04 PM

## 2021-07-12 NOTE — Telephone Encounter (Signed)
I called pt multiple times today along with her sisters number and have been unable to reach either of them to remind her of the appointment tomor.   Will try again in the morning.   Marylouise Stacks, EMT-Paramedic  07/12/21

## 2021-07-13 ENCOUNTER — Ambulatory Visit (HOSPITAL_COMMUNITY)
Admission: RE | Admit: 2021-07-13 | Discharge: 2021-07-13 | Disposition: A | Discharge status: Home / Self Care | Payer: Medicare (Managed Care) | Source: Ambulatory Visit | Attending: Family Medicine | Admitting: Family Medicine

## 2021-07-13 ENCOUNTER — Other Ambulatory Visit (HOSPITAL_COMMUNITY): Payer: Self-pay

## 2021-07-13 ENCOUNTER — Other Ambulatory Visit: Payer: Self-pay

## 2021-07-13 ENCOUNTER — Encounter (HOSPITAL_COMMUNITY): Payer: Self-pay

## 2021-07-13 VITALS — BP 128/60 | HR 90 | Wt 223.2 lb

## 2021-07-13 DIAGNOSIS — Z79899 Other long term (current) drug therapy: Secondary | ICD-10-CM | POA: Diagnosis not present

## 2021-07-13 DIAGNOSIS — F17210 Nicotine dependence, cigarettes, uncomplicated: Secondary | ICD-10-CM | POA: Diagnosis not present

## 2021-07-13 DIAGNOSIS — E1151 Type 2 diabetes mellitus with diabetic peripheral angiopathy without gangrene: Secondary | ICD-10-CM | POA: Insufficient documentation

## 2021-07-13 DIAGNOSIS — Z7984 Long term (current) use of oral hypoglycemic drugs: Secondary | ICD-10-CM | POA: Diagnosis not present

## 2021-07-13 DIAGNOSIS — I5042 Chronic combined systolic (congestive) and diastolic (congestive) heart failure: Secondary | ICD-10-CM | POA: Insufficient documentation

## 2021-07-13 DIAGNOSIS — N1831 Chronic kidney disease, stage 3a: Secondary | ICD-10-CM

## 2021-07-13 DIAGNOSIS — Z79890 Hormone replacement therapy (postmenopausal): Secondary | ICD-10-CM | POA: Insufficient documentation

## 2021-07-13 DIAGNOSIS — Z8249 Family history of ischemic heart disease and other diseases of the circulatory system: Secondary | ICD-10-CM | POA: Diagnosis not present

## 2021-07-13 DIAGNOSIS — I480 Paroxysmal atrial fibrillation: Secondary | ICD-10-CM | POA: Diagnosis not present

## 2021-07-13 DIAGNOSIS — E1122 Type 2 diabetes mellitus with diabetic chronic kidney disease: Secondary | ICD-10-CM | POA: Insufficient documentation

## 2021-07-13 DIAGNOSIS — K7460 Unspecified cirrhosis of liver: Secondary | ICD-10-CM | POA: Diagnosis not present

## 2021-07-13 DIAGNOSIS — Z7951 Long term (current) use of inhaled steroids: Secondary | ICD-10-CM | POA: Insufficient documentation

## 2021-07-13 DIAGNOSIS — Z7902 Long term (current) use of antithrombotics/antiplatelets: Secondary | ICD-10-CM | POA: Diagnosis not present

## 2021-07-13 DIAGNOSIS — E11621 Type 2 diabetes mellitus with foot ulcer: Secondary | ICD-10-CM | POA: Insufficient documentation

## 2021-07-13 DIAGNOSIS — Z794 Long term (current) use of insulin: Secondary | ICD-10-CM | POA: Diagnosis not present

## 2021-07-13 DIAGNOSIS — J449 Chronic obstructive pulmonary disease, unspecified: Secondary | ICD-10-CM | POA: Insufficient documentation

## 2021-07-13 DIAGNOSIS — N183 Chronic kidney disease, stage 3 unspecified: Secondary | ICD-10-CM | POA: Diagnosis not present

## 2021-07-13 DIAGNOSIS — E039 Hypothyroidism, unspecified: Secondary | ICD-10-CM | POA: Insufficient documentation

## 2021-07-13 DIAGNOSIS — I739 Peripheral vascular disease, unspecified: Secondary | ICD-10-CM | POA: Diagnosis not present

## 2021-07-13 DIAGNOSIS — G4733 Obstructive sleep apnea (adult) (pediatric): Secondary | ICD-10-CM | POA: Insufficient documentation

## 2021-07-13 DIAGNOSIS — I10 Essential (primary) hypertension: Secondary | ICD-10-CM

## 2021-07-13 DIAGNOSIS — Z7901 Long term (current) use of anticoagulants: Secondary | ICD-10-CM | POA: Insufficient documentation

## 2021-07-13 DIAGNOSIS — L97519 Non-pressure chronic ulcer of other part of right foot with unspecified severity: Secondary | ICD-10-CM | POA: Diagnosis not present

## 2021-07-13 DIAGNOSIS — I2510 Atherosclerotic heart disease of native coronary artery without angina pectoris: Secondary | ICD-10-CM | POA: Diagnosis not present

## 2021-07-13 DIAGNOSIS — Z720 Tobacco use: Secondary | ICD-10-CM

## 2021-07-13 DIAGNOSIS — I130 Hypertensive heart and chronic kidney disease with heart failure and stage 1 through stage 4 chronic kidney disease, or unspecified chronic kidney disease: Secondary | ICD-10-CM | POA: Insufficient documentation

## 2021-07-13 DIAGNOSIS — E782 Mixed hyperlipidemia: Secondary | ICD-10-CM

## 2021-07-13 DIAGNOSIS — Z8719 Personal history of other diseases of the digestive system: Secondary | ICD-10-CM

## 2021-07-13 LAB — BASIC METABOLIC PANEL
Anion gap: 11 (ref 5–15)
BUN: 28 mg/dL — ABNORMAL HIGH (ref 6–20)
CO2: 30 mmol/L (ref 22–32)
Calcium: 8.7 mg/dL — ABNORMAL LOW (ref 8.9–10.3)
Chloride: 94 mmol/L — ABNORMAL LOW (ref 98–111)
Creatinine, Ser: 1.65 mg/dL — ABNORMAL HIGH (ref 0.44–1.00)
GFR, Estimated: 36 mL/min — ABNORMAL LOW (ref >60)
Glucose, Bld: 346 mg/dL — ABNORMAL HIGH (ref 70–99)
Potassium: 3.9 mmol/L (ref 3.5–5.1)
Sodium: 135 mmol/L (ref 135–145)

## 2021-07-13 NOTE — Progress Notes (Signed)
Paramedicine Encounter   Patient ID: [name] , female,   DOB: [birth year],59 y.o.,  MRN: [id number]   Met patient in clinic today with provider.  Weight @ clinic-223 B/p-128/60 P-90 Sp02-95  Pt here for f/u. She does sound a lot better than last week with her hoarseness.  Her fluid level looks good, doing her cardiomems-her goal is 20 and she was 19 today.  Pt reports that the [facility] has d/c her and no longer coming out. Need to order her meds from pharmacy.    Sch txp for her new PCP appointment tomor. Made a note on her AVS from todays visit to show doc tomor for refills needed by PCP to be sent in to pharmacy.  P/u @ 850 Advised her they will call her in the morning with desc of vehicle.   Marylouise Stacks, [place] 07/13/2021

## 2021-07-13 NOTE — Patient Instructions (Signed)
Labs done today, your results will be available in MyChart, we will contact you for abnormal readings.  Your physician recommends that you schedule a follow-up appointment in: 2-3 months  If you have any questions or concerns before your next appointment please send [place] a message through [place] or call our office at [phone].    TO LEAVE A MESSAGE FOR THE NURSE SELECT OPTION 2, PLEASE LEAVE A MESSAGE INCLUDING: YOUR NAME DATE OF BIRTH CALL BACK NUMBER REASON FOR CALL**this is important as we prioritize the call backs  YOU WILL RECEIVE A CALL BACK THE SAME DAY AS LONG AS YOU CALL BEFORE 4:00 PM  At the [facility], you and your health needs are our priority. As part of our continuing mission to provide you with exceptional heart care, we have created designated Provider Care Teams. These Care Teams include your primary Cardiologist (physician) and Advanced Practice Providers (APPs- Physician Assistants and Nurse Practitioners) who all work together to provide you with the care you need, when you need it.   You may see any of the following providers on your designated Care Team at your next follow up: Dr Glori Bickers Dr Loralie Champagne Dr Patrice Paradise, NP Lyda Jester, [place] Ginnie Smart Audry Riles, PharmD   Please be sure to bring in all your medications bottles to every appointment.

## 2021-07-14 ENCOUNTER — Other Ambulatory Visit: Payer: Self-pay

## 2021-07-14 ENCOUNTER — Encounter: Payer: Self-pay | Admitting: Family Medicine

## 2021-07-14 ENCOUNTER — Ambulatory Visit: Payer: Medicare (Managed Care) | Attending: Family Medicine | Admitting: Family Medicine

## 2021-07-14 VITALS — BP 128/61 | HR 85 | Ht 64.0 in | Wt 223.0 lb

## 2021-07-14 DIAGNOSIS — E1122 Type 2 diabetes mellitus with diabetic chronic kidney disease: Secondary | ICD-10-CM | POA: Diagnosis not present

## 2021-07-14 DIAGNOSIS — N1831 Chronic kidney disease, stage 3a: Secondary | ICD-10-CM | POA: Diagnosis not present

## 2021-07-14 DIAGNOSIS — E038 Other specified hypothyroidism: Secondary | ICD-10-CM

## 2021-07-14 DIAGNOSIS — F419 Anxiety disorder, unspecified: Secondary | ICD-10-CM

## 2021-07-14 DIAGNOSIS — I5042 Chronic combined systolic (congestive) and diastolic (congestive) heart failure: Secondary | ICD-10-CM | POA: Diagnosis not present

## 2021-07-14 DIAGNOSIS — I130 Hypertensive heart and chronic kidney disease with heart failure and stage 1 through stage 4 chronic kidney disease, or unspecified chronic kidney disease: Secondary | ICD-10-CM

## 2021-07-14 DIAGNOSIS — F32A Depression, unspecified: Secondary | ICD-10-CM

## 2021-07-14 DIAGNOSIS — L97411 Non-pressure chronic ulcer of right heel and midfoot limited to breakdown of skin: Secondary | ICD-10-CM

## 2021-07-14 DIAGNOSIS — I480 Paroxysmal atrial fibrillation: Secondary | ICD-10-CM

## 2021-07-14 DIAGNOSIS — E1151 Type 2 diabetes mellitus with diabetic peripheral angiopathy without gangrene: Secondary | ICD-10-CM | POA: Diagnosis not present

## 2021-07-14 DIAGNOSIS — E08621 Diabetes mellitus due to underlying condition with foot ulcer: Secondary | ICD-10-CM

## 2021-07-14 DIAGNOSIS — I110 Hypertensive heart disease with heart failure: Secondary | ICD-10-CM

## 2021-07-14 DIAGNOSIS — I129 Hypertensive chronic kidney disease with stage 1 through stage 4 chronic kidney disease, or unspecified chronic kidney disease: Secondary | ICD-10-CM

## 2021-07-14 DIAGNOSIS — Z9862 Peripheral vascular angioplasty status: Secondary | ICD-10-CM

## 2021-07-14 LAB — GLUCOSE, POCT (MANUAL RESULT ENTRY)
POC Glucose: 387 mg/dl — AB (ref 70–99)
POC Glucose: 348 mg/dl — AB (ref 70–99)

## 2021-07-14 MED ORDER — FREESTYLE LIBRE 2 SENSOR MISC: 11 refills | Status: DC

## 2021-07-14 MED ORDER — INSULIN ASPART 100 UNIT/ML IJ SOLN
20.0000 [IU] | Freq: Once | INTRAMUSCULAR | Status: AC
Start: 2021-07-14 — End: 2021-07-14
  Administered 2021-07-14: 20 [IU] via SUBCUTANEOUS

## 2021-07-14 MED ORDER — INSULIN GLARGINE 100 UNIT/ML SOLOSTAR PEN: 30.0000 [IU] | PEN_INJECTOR | Freq: Every day | SUBCUTANEOUS | 6 refills | Status: DC

## 2021-07-14 MED ORDER — TRULICITY 0.75 MG/0.5ML ~~LOC~~ SOAJ: 0.7500 mg | SUBCUTANEOUS | 6 refills | Status: DC

## 2021-07-14 MED ORDER — FOLIC ACID 1 MG PO TABS: 1.0000 mg | ORAL_TABLET | Freq: Every day | ORAL | 1 refills | Status: DC

## 2021-07-14 MED ORDER — FLUOXETINE HCL 20 MG PO CAPS: 20.0000 mg | ORAL_CAPSULE | Freq: Every day | ORAL | 1 refills | Status: DC

## 2021-07-14 NOTE — Progress Notes (Signed)
Has been having a cough.  Weakness in legs.

## 2021-07-14 NOTE — Patient Instructions (Signed)
Decrease Nortriptyline to '50mg'$  daily. We are working on tapering you off this.

## 2021-07-14 NOTE — Progress Notes (Signed)
Subjective:  Patient ID: [name], female    DOB: March 09, 1961  Age: 60 y.o. MRN: [id number]  CC: Diabetes   HPI [name] is a 60 y.o. year old female with a history of PAD (status post  LLE bypass drug-coated balloon angioplasty of right SFA secondary to critical right limb ischemia), carotid stenosis s/p left CEA, HFrEF (EF 55 to 60%, grade 2 DD), CAD, paroxysmal A. fib, previous substance abuse, type 2 diabetes mellitus, right foot transmetatarsal amputation, stage III CKD, liver cirrhosis, hypothyroidism, COPD, hypertension, Anxiety and Depression, tobacco abuse.  Interval History: Seen by cardiology yesterday.  Her PAD is followed by Dr. Alvester Chou. She does have chronic feet pain; bilateral claudication, not a candidate for cilostazol due to CHF per cardiology notes.  She ran out of Trulicity.  Compliant with her Lantus and Humalog. Sugars at home have been high - 446 She goes to the wound care center once /week due to a chronic R foot ulcer  Endorses presence of neuropathy. Cymbalta, Lyrica and Gabapentin make her shakky. Complains her anxiety and depression are uncontrolled on her current dose Prozac.[name] Also on Nortriptyline and she is unsure what for.   Past Medical History:  Diagnosis Date   Alcohol abuse    Alcoholic cirrhosis ([place])    Anemia    Anxiety    Arthritis    "knees" (11/26/2018)   B12 deficiency    CAD (coronary artery disease)    a. 11/10/2014 Cath: LM nl, LAD min irregs, D1 30 ost, D2 50d, LCX 13m OM1 80 p/m (1.5 mm vessel), OM2 414mRCA nondom 9076mmed rx.. Demand ischemia in the setting of rapid a-fib.   Cardiomyopathy ([place]  Carotid artery disease ([place]  a. 01/2015 Carotid Angio: RICA [id number] L CEA; c. 05/2019 Carotid U/S: RICA 100. RECA >50. LICA [id number] Cellulitis    lower extremities   CHF (congestive heart failure) ([name])    Chronic combined systolic and diastolic CHF (congestive heart failure) ([place]  a. 10/2014 Echo:  EF 40-45%; b. 10/2018 Echo: EF 45-50%, gr2 DD; c. 11/2019 Echo: EF 50%, mild LVH, gr2 DD (restrictive), antlat HK, Nl RV fxn. Mild BAE. RVSP 59.9mm37m   CKD (chronic kidney disease), stage III ([name])    Cocaine abuse ([name])    COPD (chronic obstructive pulmonary disease) ([name])    Critical lower limb ischemia ([name])    Depression    Diabetes mellitus without complication ([name])    Diabetic peripheral neuropathy ([name])    DVT (deep venous thrombosis) ([name])    Dyspnea    Elevated troponin    a. Chronic elevation.   GERD (gastroesophageal reflux disease)    Hyperlipemia    Hypertension    Hypokalemia    Hypomagnesemia    Hypothyroidism    Marijuana abuse    Narcotic abuse ([name])[place] Noncompliance    NSVT (nonsustained ventricular tachycardia) ([name])    Obesity    PAF (paroxysmal atrial fibrillation) ([name])    Paroxysmal atrial tachycardia ([name])    Peripheral arterial disease ([name])[place] a. 01/2015 Angio/PTA: RSFA 99 (atherectomy/pta) - 1 vessel runoff via diff dzs peroneal; b. 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   Pneumonia    "once or twice" (11/26/2018)   Poorly controlled type 2 diabetes mellitus ([name])[place] Renal disorder    Renal insufficiency  a. 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   Sleep apnea    "couldn't handle wearing the mask" (11/26/2018)   Symptomatic bradycardia    a. Avoid AV blocking agent per EP. Prev req temp wire in 2017.   Tobacco abuse     Past Surgical History:  Procedure Laterality Date   ABDOMINAL AORTOGRAM N/A 06/26/2019   Procedure: ABDOMINAL AORTOGRAM;  Surgeon: Wellington Hampshire, MD;  Location: [place] [facility];  Service: Cardiovascular;  Laterality: N/A;   ABDOMINAL AORTOGRAM W/LOWER EXTREMITY N/A 06/07/2021   Procedure: ABDOMINAL AORTOGRAM W/LOWER EXTREMITY;  Surgeon: [name], MD;  Location: [place] [facility];  Service: Cardiovascular;  Laterality: N/A;   AMPUTATION Right 06/14/2017   Procedure: Right foot transmetatarsal amputation;   Surgeon: Newt Minion, MD;  Location: [place];  Service: Orthopedics;  Laterality: Right;   AMPUTATION Left 06/28/2019   Procedure: AMPUTATION LEFT FIFTH TOE;  Surgeon: Rosetta Posner, MD;  Location: [place];  Service: Vascular;  Laterality: Left;   AMPUTATION TOE Right 04/28/2017   Procedure: AMPUTATION OF RIGHT SECOND RAY;  Surgeon: Newt Minion, MD;  Location: [place];  Service: Orthopedics;  Laterality: Right;   CARDIAC CATHETERIZATION     CARDIAC CATHETERIZATION N/A 01/12/2016   Procedure: Temporary Wire;  Surgeon: Minus Breeding, MD;  Location: [place] [facility];  Service: Cardiovascular;  Laterality: N/A;   CARDIOVERSION  ~ 02/2013   "twice"    CAROTID ANGIOGRAM N/A 01/15/2015   Procedure: CAROTID ANGIOGRAM;  Surgeon: [name], MD;  Location: [facility] C[facility];  Service: Cardiovascular;  Laterality: N/A;   DILATION AND CURETTAGE OF UTERUS  1988   ENDARTERECTOMY Left 02/19/2015   Procedure: LEFT CAROTID ENDARTERECTOMY ;  Surgeon: Serafina Mitchell, MD;  Location: [place];  Service: Vascular;  Laterality: Left;   FEMORAL-TIBIAL BYPASS GRAFT Left 06/28/2019   Procedure: BYPASS GRAFT LEFT LEG FEMORAL TO ANTERIOR TIBIAL ARTERY using LEFT GREATER SAPHENOUS VEIN;  Surgeon: Rosetta Posner, MD;  Location: [place];  Service: Vascular;  Laterality: Left;   LEFT HEART CATHETERIZATION WITH CORONARY ANGIOGRAM N/A 10/31/2014   Procedure: LEFT HEART CATHETERIZATION WITH CORONARY ANGIOGRAM;  Surgeon: Burnell Blanks, MD;  Location: [facility] CATH LAB;  Service: Cardiovascular;  Laterality: N/A;   LOWER EXTREMITY ANGIOGRAM N/A 09/10/2013   Procedure: LOWER EXTREMITY ANGIOGRAM;  Surgeon: [name], MD;  Location: [facility] CATH LAB;  Service: Cardiovascular;  Laterality: N/A;   LOWER EXTREMITY ANGIOGRAM N/A 01/15/2015   Procedure: LOWER EXTREMITY ANGIOGRAM;  Surgeon: [name], MD;  Location: [facility];  Service: Cardiovascular;  Laterality: N/A;   LOWER EXTREMITY ANGIOGRAPHY N/A 04/13/2017   Procedure: Lower  Extremity Angiography;  Surgeon: [name], MD;  Location: [place] [facility];  Service: Cardiovascular;  Laterality: N/A;   LOWER EXTREMITY ANGIOGRAPHY Left 06/26/2019   Procedure: LOWER EXTREMITY ANGIOGRAPHY;  Surgeon: Wellington Hampshire, MD;  Location: [place] [facility];  Service: Cardiovascular;  Laterality: Left;   PERIPHERAL VASCULAR ATHERECTOMY Right 06/07/2021   Procedure: PERIPHERAL VASCULAR ATHERECTOMY;  Surgeon: [name], MD;  Location: [place] [facility];  Service: Cardiovascular;  Laterality: Right;   PERIPHERAL VASCULAR BALLOON ANGIOPLASTY Left 06/26/2019   Procedure: PERIPHERAL VASCULAR BALLOON ANGIOPLASTY;  Surgeon: Wellington Hampshire, MD;  Location: [place] [facility];  Service: Cardiovascular;  Laterality: Left;  unable to cross lt sfa occlusion   PERIPHERAL VASCULAR INTERVENTION  04/13/2017   Procedure: Peripheral Vascular Intervention;  Surgeon: [name], MD;  Location: [place] [facility];  Service: Cardiovascular;;   PRESSURE SENSOR/CARDIOMEMS N/A 02/05/2020   Procedure: PRESSURE SENSOR/CARDIOMEMS;  Surgeon: Larey Dresser, MD;  Location: [place] [facility];  Service: Cardiovascular;  Laterality: N/A;   VEIN HARVEST Left 06/28/2019   Procedure: LEFT LEG GREATER SAPHENOUS VEIN HARVEST;  Surgeon: Rosetta Posner, MD;  Location: [facility];  Service: Vascular;  Laterality: Left;    Family History  Problem Relation Age of Onset   Hypertension Mother    Diabetes Mother    Cancer Mother        breast, ovarian, colon   Clotting disorder Mother    Heart disease Mother    Heart attack Mother    Breast cancer Mother        in 109's   Hypertension Father    Heart disease Father    Emphysema Sister        smoked    Allergies  Allergen Reactions   Gabapentin Nausea And Vomiting and Other (See Comments)    POSSIBLE SHAKING   Lyrica [Pregabalin] Other (See Comments)    Shaking     Outpatient Medications Prior to Visit  Medication Sig Dispense Refill    ACCU-CHEK GUIDE test strip USE TO CHECK BLOOD SUGAR FOUR TIMES DAILY     acetaminophen (TYLENOL) 500 MG tablet Take 500 mg by mouth every 6 (six) hours as needed for moderate pain.     albuterol (VENTOLIN HFA) 108 (90 Base) MCG/ACT inhaler Inhale 2 puffs into the lungs every 6 (six) hours as needed for wheezing or shortness of breath. 18 g 0   amiodarone (PACERONE) 200 MG tablet Take 0.5 tablets (100 mg total) by mouth daily. 45 tablet 3   amLODipine (NORVASC) 10 MG tablet Take 1 tablet (10 mg total) by mouth daily. 90 tablet 3   apixaban (ELIQUIS) 5 MG TABS tablet Take 5 mg by mouth 2 (two) times daily.     atorvastatin (LIPITOR) 40 MG tablet Take 40 mg by mouth every evening.     budesonide-formoterol (SYMBICORT) 160-4.5 MCG/ACT inhaler Inhale 2 puffs into the lungs 2 (two) times daily. 1 Inhaler 2   clopidogrel (PLAVIX) 75 MG tablet Take 1 tablet (75 mg total) by mouth daily with breakfast. 90 tablet 1   colchicine 0.6 MG tablet Take 0.6 mg by mouth daily.     empagliflozin (JARDIANCE) 10 MG TABS tablet Take 1 tablet (10 mg total) by mouth daily before breakfast. 90 tablet 3   hydrALAZINE (APRESOLINE) 25 MG tablet Take 1 tablet (25 mg total) by mouth 3 (three) times daily. 90 tablet 11   insulin lispro (HUMALOG) 100 UNIT/ML KwikPen Inject 8 Units into the skin 3 (three) times daily with meals. 15 mL 0   Insulin Pen Needle 32G X 4 MM MISC USE AS DIRECTED WITH INSULIN PENS 100 each 0   levothyroxine (SYNTHROID, LEVOTHROID) 50 MCG tablet Take 1 tablet (50 mcg total) by mouth daily. 30 tablet 0   losartan (COZAAR) 100 MG tablet Take 1 tablet (100 mg total) by mouth daily. 30 tablet 6   nortriptyline (PAMELOR) 50 MG capsule Take 50 mg by mouth 2 (two) times daily.     Omega-3 Fatty Acids (FISH OIL PO) Take 1 capsule by mouth daily.     pantoprazole (PROTONIX) 40 MG tablet Take 1 tablet (40 mg total) by mouth daily.     torsemide (DEMADEX) 20 MG tablet Take 4 tablets (80 mg total) by mouth every  morning AND  3 tablets (60 mg total) every evening. 210 tablet 11   VITAMIN D PO Take 1 tablet by mouth daily.     Continuous Blood Gluc Sensor (FREESTYLE LIBRE 2 SENSOR) MISC USE 1 (ONE) EACH EVERY 2 WEEKS     Dulaglutide (TRULICITY) A999333 0000000 SOPN Inject 0.75 mg into the skin every Friday.     FLUoxetine (PROZAC) 10 MG capsule Take 1 capsule (10 mg total) by mouth at bedtime. 30 capsule 0   folic acid (FOLVITE) 1 MG tablet Take 1 mg by mouth daily.     insulin glargine (LANTUS) 100 UNIT/ML Solostar Pen INJECT 20 UNITS INTO THE SKIN DAILY. 15 mL 0   No facility-administered medications prior to visit.     ROS Review of Systems  Constitutional:  Negative for activity change, appetite change and fatigue.  HENT:  Negative for congestion, sinus pressure and sore throat.   Eyes:  Negative for visual disturbance.  Respiratory:  Negative for cough, chest tightness, shortness of breath and wheezing.   Cardiovascular:  Positive for leg swelling. Negative for chest pain and palpitations.  Gastrointestinal:  Negative for abdominal distention, abdominal pain and constipation.  Endocrine: Negative for polydipsia.  Genitourinary:  Negative for dysuria and frequency.  Musculoskeletal:  Positive for arthralgias. Negative for back pain.  Skin:  Positive for wound. Negative for rash.  Neurological:  Positive for weakness. Negative for tremors, light-headedness and numbness.  Hematological:  Bruises/bleeds easily.  Psychiatric/Behavioral:  Positive for dysphoric mood. Negative for agitation and behavioral problems.    Objective:  BP 128/61   Pulse 85   Ht '5\' 4"'$  (1.626 m)   Wt 223 lb (101.2 kg)   LMP  (LMP Unknown)   SpO2 96%   BMI 38.28 kg/m   BP/Weight 07/14/2021 07/13/2021 0000000  Systolic BP 0000000 0000000 [id number]  Diastolic BP 61 60 72  Wt. (Lbs) 223 223.25 -  BMI 38.28 38.32 -      Physical Exam Constitutional:      Appearance: She is well-developed. She is obese.  Cardiovascular:      Rate and Rhythm: Normal rate.     Heart sounds: Normal heart sounds. No murmur heard. Pulmonary:     Effort: Pulmonary effort is normal.     Breath sounds: Normal breath sounds. No wheezing or rales.  Chest:     Chest wall: No tenderness.  Abdominal:     General: Bowel sounds are normal. There is no distension.     Palpations: Abdomen is soft. There is no mass.     Tenderness: There is no abdominal tenderness.  Musculoskeletal:     Comments: 1+ bilateral lower extremity edema nonpitting up to mid leg Right foot wrapped in dressing, in a boot  Skin:    Findings: Bruising present.     Comments: Hyperpigmented lesions on both forearms and hands  Neurological:     Mental Status: She is alert and oriented to person, place, and time.  Psychiatric:     Comments: Dysphoric mood    CMP Latest Ref Rng & Units 07/13/2021 06/14/2021 06/13/2021  Glucose 70 - 99 mg/dL 346(H) 238(H) 194(H)  BUN 6 - 20 mg/dL 28(H) 54(H) 58(H)  Creatinine 0.44 - 1.00 mg/dL 1.65(H) 2.07(H) 2.40(H)  Sodium 135 - 145 mmol/L 135 135 137  Potassium 3.5 - 5.1 mmol/L 3.9 4.0 4.2  Chloride 98 - 111 mmol/L 94(L) 100 102  CO2 22 - 32 mmol/L '30 25 25  '$ Calcium 8.9 - 10.3 mg/dL 8.7(L) 8.2(L) 8.1(L)  Total Protein 6.5 - 8.1 g/dL - - -  Total Bilirubin 0.3 - 1.2 mg/dL - - -  Alkaline Phos 38 - 126 U/L - - -  AST 15 - 41 U/L - - -  ALT 0 - 44 U/L - - -    Lipid Panel     Component Value Date/Time   CHOL 129 03/08/2021 1551   TRIG 115 03/08/2021 1551   HDL 47 03/08/2021 1551   CHOLHDL 2.7 03/08/2021 1551   VLDL 23 03/08/2021 1551   LDLCALC 59 03/08/2021 1551    CBC    Component Value Date/Time   WBC 11.1 (H) 06/13/2021 1608   RBC 3.50 (L) 06/13/2021 1608   HGB 9.6 (L) 06/13/2021 1608   HGB 10.5 (L) 05/25/2021 1444   HCT 31.5 (L) 06/13/2021 1608   HCT 35.5 05/25/2021 1444   PLT 263 06/13/2021 1608   PLT 368 05/25/2021 1444   MCV 90.0 06/13/2021 1608   MCV 88 05/25/2021 1444   MCH 27.4 06/13/2021 1608   MCHC  30.5 06/13/2021 1608   RDW 16.2 (H) 06/13/2021 1608   RDW 14.1 05/25/2021 1444   LYMPHSABS 2.1 01/05/2021 0308   LYMPHSABS 1.6 06/20/2019 1125   MONOABS 0.7 01/05/2021 0308   EOSABS 0.1 01/05/2021 0308   EOSABS 0.3 06/20/2019 1125   BASOSABS 0.0 01/05/2021 0308   BASOSABS 0.0 06/20/2019 1125    Lab Results  Component Value Date   HGBA1C 9.7 (H) 05/04/2021    Lab Results  Component Value Date   TSH 2.305 06/13/2021    Assessment & Plan:  1. DM (diabetes mellitus), type 2 with peripheral vascular complications ([name]) Uncontrolled with A1c of 9.7 High risk patient with blood sugar of 387 in the clinic and 10 units of NovoLog administered and patient observed for 35 minutes prior to repeating blood sugar which returned at 348. Increase Lantus from 20 units to 30 units nightly Restart Trulicity Scheduled in 1 month for blood sugar log evaluation Peripheral vascular disease status post PCI bilaterally Glycemic control is imperative in reducing her risk - POCT glucose (manual entry) - insulin glargine (LANTUS) 100 UNIT/ML Solostar Pen; Inject 30 Units into the skin daily.  Dispense: 30 mL; Refill: 6 - Dulaglutide (TRULICITY) A999333 0000000 SOPN; Inject 0.75 mg into the skin every Friday.  Dispense: 2 mL; Refill: 6 - Continuous Blood Gluc Sensor (FREESTYLE LIBRE 2 SENSOR) MISC; USE 1 (ONE) EACH EVERY 2 WEEKS  Dispense: 2 each; Refill: 11 - insulin aspart (novoLOG) injection 20 Units - POCT glucose (manual entry)  2. Other specified hypothyroidism Controlled from last set of blood work Continue current dose of levothyroxine  3. PAF (paroxysmal atrial fibrillation) ([name]) High risk patient on high risk medications ,currently in sinus rhythm On high risk medications including amiodarone and Eliquis TFTs normal, continue to monitor thyroid panel due to risk from amiodarone Will also need to screen for pulmonary fibrosis at next visit given risk with amiodarone; last PFT was in  2017 Eliquis places her at high risk for bleeding and she does have a number of bruises on her forearms.  Fall precautions necessary  4. Hypertension associated with stage 3a chronic kidney disease due to type 2 diabetes mellitus ([place]) Combination of hypertensive and diabetic nephropathy Avoid nephrotoxins Blood pressure is controlled Counseled on blood pressure goal of less than 130/80, low-sodium, DASH diet, medication compliance, 150 minutes of moderate intensity exercise per week. Discussed medication compliance, adverse effects.   5. S/P peripheral artery angioplasty -  [place] atherectomy; R SFA Uncontrolled with ongoing claudication pain Risk factor modification is imperative Continue high-dose statin Follow-up with vascular Continue with compression stocking  6. Anxiety and depression Uncontrolled Increase Prozac dose She is currently on nortriptyline and is unsure of the indication.  We will work on tapering her off of nortriptyline given drug drug interaction and increased risk of prolonged QT Advised to reduced amitriptyline to 50 mg daily and at next visit we will cut down to 25 mg daily - FLUoxetine (PROZAC) 20 MG capsule; Take 1 capsule (20 mg total) by mouth at bedtime.  Dispense: 90 capsule; Refill: 1  7. Hypertensive heart disease with chronic combined systolic and diastolic congestive heart failure ([name]) EF 55 to 60%, euvolemic, NYHA II Continue guideline directed medical therapy  8. Diabetic ulcer of right midfoot associated with diabetes mellitus due to underlying condition, limited to breakdown of skin ([place]) Uncontrolled Glycemic control is important Continue follow-up with wound care   Health Care Maintenance: We will address at next visit Meds ordered this encounter  Medications   insulin glargine (LANTUS) 100 UNIT/ML Solostar Pen    Sig: Inject 30 Units into the skin daily.    Dispense:  30 mL    Refill:  6    Dose increase   Dulaglutide (TRULICITY)  A999333 0000000 SOPN    Sig: Inject 0.75 mg into the skin every Friday.    Dispense:  2 mL    Refill:  6   FLUoxetine (PROZAC) 20 MG capsule    Sig: Take 1 capsule (20 mg total) by mouth at bedtime.    Dispense:  90 capsule    Refill:  1    Dose increase   folic acid (FOLVITE) 1 MG tablet    Sig: Take 1 tablet (1 mg total) by mouth daily.    Dispense:  90 tablet    Refill:  1   Continuous Blood Gluc Sensor (FREESTYLE LIBRE 2 SENSOR) MISC    Sig: USE 1 (ONE) EACH EVERY 2 WEEKS    Dispense:  2 each    Refill:  11   insulin aspart (novoLOG) injection 20 Units     Follow-up: Return in about 1 month (around 08/13/2021) for Follow-up on diabetes.    I spent 62 minutes reviewing patient's chart, including median intraservice time, administering insulin due to hyperglycemia and high risk patient and monitoring for adverse effect.  Medications and indications reviewed with the patient with instruction regarding medication changes and titration.  All the patient's questions were answered and she was satisfied.   Charlott Rakes, MD, FAAFP. [facility] [place], [place]   07/14/2021, 3:29 PM

## 2021-07-20 ENCOUNTER — Other Ambulatory Visit: Payer: Self-pay

## 2021-07-20 ENCOUNTER — Encounter (HOSPITAL_BASED_OUTPATIENT_CLINIC_OR_DEPARTMENT_OTHER): Payer: Medicare Other | Attending: Internal Medicine | Admitting: Internal Medicine

## 2021-07-20 DIAGNOSIS — X58XXXD Exposure to other specified factors, subsequent encounter: Secondary | ICD-10-CM | POA: Diagnosis not present

## 2021-07-20 DIAGNOSIS — E1140 Type 2 diabetes mellitus with diabetic neuropathy, unspecified: Secondary | ICD-10-CM | POA: Diagnosis not present

## 2021-07-20 DIAGNOSIS — N1832 Chronic kidney disease, stage 3b: Secondary | ICD-10-CM | POA: Diagnosis not present

## 2021-07-20 DIAGNOSIS — E1122 Type 2 diabetes mellitus with diabetic chronic kidney disease: Secondary | ICD-10-CM | POA: Insufficient documentation

## 2021-07-20 DIAGNOSIS — F17210 Nicotine dependence, cigarettes, uncomplicated: Secondary | ICD-10-CM | POA: Insufficient documentation

## 2021-07-20 DIAGNOSIS — E1151 Type 2 diabetes mellitus with diabetic peripheral angiopathy without gangrene: Secondary | ICD-10-CM | POA: Insufficient documentation

## 2021-07-20 DIAGNOSIS — I509 Heart failure, unspecified: Secondary | ICD-10-CM | POA: Diagnosis not present

## 2021-07-20 DIAGNOSIS — E11621 Type 2 diabetes mellitus with foot ulcer: Secondary | ICD-10-CM | POA: Diagnosis not present

## 2021-07-20 DIAGNOSIS — S91301D Unspecified open wound, right foot, subsequent encounter: Secondary | ICD-10-CM | POA: Insufficient documentation

## 2021-07-20 DIAGNOSIS — I130 Hypertensive heart and chronic kidney disease with heart failure and stage 1 through stage 4 chronic kidney disease, or unspecified chronic kidney disease: Secondary | ICD-10-CM | POA: Insufficient documentation

## 2021-07-20 DIAGNOSIS — I872 Venous insufficiency (chronic) (peripheral): Secondary | ICD-10-CM | POA: Diagnosis not present

## 2021-07-20 NOTE — Progress Notes (Signed)
[name], [name] ([id number]) Visit Report for 07/20/2021 Debridement Details Patient Name: Date of Service: HA [name] 07/20/2021 2:15 PM Medical Record Number: [id number] Patient Account Number: [id number] Date of Birth/Sex: Treating RN: 21-Sep-1961 (60 y.o. [name] Primary Care Provider: [place] [place] Other Clinician: Referring Provider: Treating Provider/Extender: [name], Jairo Ben in Treatment: 21 Debridement Performed for Assessment: Wound #16 Right,Plantar Amputation Site - Transmetatarsal Performed By: Physician [name]., MD Debridement Type: Debridement Severity of Tissue Pre Debridement: Fat layer exposed Level of Consciousness (Pre-procedure): Awake and Alert Pre-procedure Verification/Time Out Yes - 14:48 Taken: Start Time: 14:48 T Area Debrided (L x W): otal 1 (cm) x 1.6 (cm) = 1.6 (cm) Tissue and other material debrided: Non-Viable, Callus, Skin: Epidermis Level: Skin/Epidermis Debridement Description: Selective/Open Wound Instrument: Curette Bleeding: Minimum Hemostasis Achieved: Pressure End Time: 14:49 Procedural Pain: 0 Post Procedural Pain: 0 Response to Treatment: Procedure was tolerated well Level of Consciousness (Post- Awake and Alert procedure): Post Debridement Measurements of Total Wound Length: (cm) 1 Width: (cm) 1.6 Depth: (cm) 0.4 Volume: (cm) 0.503 Character of Wound/Ulcer Post Debridement: Improved Severity of Tissue Post Debridement: Fat layer exposed Post Procedure Diagnosis Same as Pre-procedure Electronic Signature(s) Signed: 07/20/2021 5:13:37 PM By: Karla Ham MD Signed: 07/20/2021 5:22:11 PM By: Karla Hammock RN Entered By: [name] on 07/20/2021 15:19:17 -------------------------------------------------------------------------------- HPI Details Patient Name: Date of Service: Karla Corwin, [name] 07/20/2021 2:15 PM Medical Record Number: [id number] Patient Account Number:  [id number] Date of Birth/Sex: Treating RN: 1961/04/04 (60 y.o. [name] Primary Care Provider: [place] Other Clinician: Referring Provider: Treating Provider/Extender: [name], Romelle Starcher Weeks in Treatment: 21 History of Present Illness HPI Description: This 60 year old patient who has a very long significant history of diabetes mellitus, previous alcohol and nicotine abuse, chronic data disease, COPD, diabetes mellitus, height hypertension, critical lower limb ischemia with several wounds being managed at the wound Center at [facility] for over a year. She was recently in the ER at [facility] and was referred to our center. He has had a long history of critical limb ischemia and over a period of time has had balloon angioplasties in March 2016, endarterectomy of the left carotid, lower extremity angiogram and treatment by Dr. Quay Burow, several cardiac catheterizations. Most recently she had an x-ray of her left foot while in the ER which showed no acute abnormality. during this ER visit she was started on ciprofloxacin and asked to continue with the wound care physicians at [facility]. Her last ABI done in June 2017 showed the right side to be 0.28 in the left side to be 0.48. Her right toe brachial index was 0.17 on the right and 0.27 on the left. her last hemoglobin A1c was 11.1% She continues to smoke about a pack of cigarettes a day. 03/28/2017 -- -- right foot x-ray -- IMPRESSION:Areas of soft tissue swelling. Mild subluxation second PIP joint. No frank dislocation or fracture. No erosive change or bony destruction. No soft tissue ulceration or radiopaque foreign body evident by radiography. There is plantar fascia calcification with a nearby inferior calcaneal spur. There are foci of arterial vascular calcification. 04/04/2017 -- the patient continues to be noncompliant and continues to complain of a lot of pain and has not done anything  about quitting smoking Readmission: 06/26/18 on evaluation today patient presents for reevaluation she has not been seen in our office since May 2018. Since she was last seen here in our office  she has undergone testing at Dr. Elspeth Cho office where it was revealed that she had an ABI of 0.68 with her previous ABI being 0.64 and a left ABI of 0.38 with previous ABI being 0.34 this study which was performed on 04/05/18 was pretty much equivalent to the study performed on 11/22/17. The findings in the end revealed on the right would appear to be moderate right lower extremity arterial disease on the left Dr. Gwenlyn Found states moderate in the report but unfortunately this appears to be much more severe at 0.38 compared to the right. Subsequently the patient was scheduled to have angiography with Dr. Gwenlyn Found on 04/12/18. This was however canceled due to the fact that the patient was found to have chronic kidney disease stage III and it was to the point that he did not feel that it will be safe to pursue angiography at that point. She has not been on any antibiotics recently. At this point Dr. Gwenlyn Found has not rescheduled anything as far as the injured [place] according to the patient he is somewhat reluctant to do so. Nonetheless with her diminished blood flow this is gonna make it somewhat difficult for her to heal. 07/05/18; this is a patient I have not seen previously. She has very significant PAD as noted above. She apparently has had revascularization efforts by Dr. Gwenlyn Found on the right on 3 occasions to the patient. She was supposed to have an attempted angiography on the left however this was canceled apparently because of stage III chronic renal failure. I will need to research all of this. She complains of significant pain in the wound and has claudication enough that when she walks to the end of the driveway she has to stop. She is been using silver alginate to the wounds on her legs and Iodoflex to the area  on her left second toe. 07/13/18 on evaluation today patient's wounds actually appear to be doing about the same. She has an appointment she tells me within the next month that is September 2019 with Dr. Gwenlyn Found to discuss options to see if there's anything else that you can recommend or do for her. Nonetheless obviously what we're trying to do is do what we can to save her leg and in turn prevent any additional worsening or damage. None in the meantime we been mainly trying to manage her ulcers as best we can. 07/27/18; some improvement in the multiplicity of wounds on her left lower extremity and foot. She's been using silver alginate. I was unable to determine that she actually has an appointment with Dr. Gwenlyn Found. We are checking into this The patient's wound includes Left lateral foot, left plantar heel, her left anterior calf wound looks close to me, left fourth toe is very close to closed and the left medial calf is perhaps the largest open area READMISSION 02/19/2021 This is a now 60 year old woman with type 2 diabetes and continued cigarette smoking. She has known PAD. She was last in this clinic in 2019 at that point with wounds on her left foot. She left in a nonhealed state. On 06/28/2019 I see she had a left femoropopliteal by vein and vascular with an amputation of the fifth ray. These managed to heal over. Her last arterial studies were in February 2021. This showed an absent waveform at the posterior tibial artery on the right a dampened monophasic dorsalis pedis on the right of 0.44. On the left again the PTA TA was absent her dorsalis pedis was 0.99  triphasic and her great toe pressure was 0.65. This would have been after her revascularization. Once again she tells me that Dr. Gwenlyn Found has done revascularization on the right leg on 3 different occasions. She has a right transmetatarsal amputation apparently done by Dr. Sharol Given remotely but I do not see a note for these right lower  extremity revascularization but I may not of looked back far enough. In any case she says that the she has a wound on the plantar aspect of the right transmetatarsal amputation site there is been there for several months. More recently she fell and had a wound on her right medial lower leg she had 5 sutures placed in the ER and then subsequently she has developed an area on the medial lower leg which was a blister that opened up. Past medical history includes type 2 diabetes, PAD, chronic renal failure, congestive heart failure, right transmetatarsal amputation, left fifth ray amputation, continued tobacco abuse, atrial fibrillation and cirrhosis. We did not attempt an ABI on the right leg today because of pain 02/26/2021; patient we admitted to the clinic last week. She has what looks to be 2 areas on her right medial and right anterior lower leg that look more like venous wounds but she has 1 on the first met head at the base of her right transmet. She has known severe PAD. She complains of a lot of pain although some of this may be neuropathic. Is difficult to exclude a component of claudication. Use silver alginate on the wounds on her legs and Iodoflex on the area on the first met head. She has an appointment with Dr. Gwenlyn Found on 5/25 although I will text him and discussed the situation. She is have poorly controlled diabetic. She has has stage IIIb chronic renal failure 4/22; patient presents for 1 week follow-up. The 2 areas on her right medial and right anterior lower leg appear well-healing. She has been using silver alginate to this area without issues. She has a first met head ulcer and it is unclear how long this has been there as it was discovered in the ED earlier this month when she was being evaluated for another issue. Iodoflex has been used at this area. 4/29; patient presents for 1 week follow-up. She has been using silver alginate to the leg wounds and Iodoflex to the plantar foot  wound. She has had this wrapped with Coban and Kerlix. She has no concerns or complaints today. 5/6; this is a difficult wound on the right plantar foot transmit site. We are not making a lot of progress. She had 2 more venous looking wounds on the right medial and right anterior lower leg 1 of which is healed. Her appointment with Dr. Gwenlyn Found is on 5/25 5/16; she did not tolerate the offloading shoe we gave her in fact she had a fall with an abrasion on her right forearm she is now back in the modified small shoe which does not offload her foot properly. Her appoint with Dr. Gwenlyn Found is still on 5/25 we have been using Iodoflex. The area on the right anterior lower leg is healed 7/18; patient presents for follow-up however has not been seen in 2 months. She was last seen at the end of May. She had a fall at the end of June and was hospitalized. She has been unable to follow-up since then. For the wound she has only been keeping it covered with gauze. She is scheduled for an aortogram with lower extremity runoff  at the end of July. 7/29; patient presents for 2-week follow-up. She has home health and they have been changing her dressings. She denies any issues and has no complaints today. She states she had a procedure where they opened up one of her vessels in her legs. She denies signs of infection. 8/5; patient presents for follow-up. She was recently in the hospital for bradycardia. She was noted to have cellulitis to the left lower extremity and Unna boot was placed due to blisters and increased swelling. She reports improvement to her left leg since being in the hospital and stability check her right plantar foot wound. She denies signs of infection. 8/23; since I last saw this patient a lot has happened. She still has the area over the right first met head in the setting of previous transmetatarsal amputation. Firstly most importantly she underwent revascularization of her occluded right SFA. She  underwent directional atherectomy followed by drug-coated balloon angioplasty. She has no named vessel below the knee hopefully the revascularization of the right SFA will improve collateral flow. It is not felt however that she has any endovascular options. She had follow-up arterial studies noninvasive on 8/9. These showed an ABI of 0.44 at the right PTA. Monophasic waveforms. On the left her great toe ABI was 0.68. Her follow-up arterial Doppler showed a 50 to 74% stenosis in the proximal SFA and a 50 to 74% stenosis in the distal SFA mild to severe atherosclerosis noted throughout the extremity. Areas of shadowing plaque seen, unable to rule out higher grade stenosis she also had a fall apparently wearing a right foot forefoot off loader that we gave her. She therefore comes in in an ordinary running shoe today. Not certain if this is the fall that ended up in the hospital with bradycardia 9/6 area over the right first metatarsal head in the setting of her previous amputation and severe PAD. She is also a diabetic with known PAD status post attempt at revascularization. She is continued smoker Again the separation of visits in the clinic is somewhat disturbing if we are going to consider her for a total contact cast. She is not wearing anything to offload this stating it causes imbalance especially the forefoot off loader. She basically comes in in bedroom slippers She also had a fall this morning about an hour ago. She has a skin tear on the left dorsal forearm Electronic Signature(s) Signed: 07/20/2021 5:13:37 PM By: Karla Ham MD Entered By: [name] on 07/20/2021 15:12:13 -------------------------------------------------------------------------------- Physical Exam Details Patient Name: Date of Service: Karla Corwin, [name] 07/20/2021 2:15 PM Medical Record Number: [id number] Patient Account Number: [id number] Date of Birth/Sex: Treating RN: 11/18/1960 (60 y.o. [name] Primary Care Provider: [place] Other Clinician: Referring Provider: Treating Provider/Extender: [name], [name] in Treatment: 21 Constitutional Sitting or standing Blood Pressure is within target range for patient.. Pulse regular and within target range for patient.[name] Respirations regular, non-labored and within target range.. Temperature is normal and within the target range for the patient.[name] Appears in no distress. Notes Wound exam; right foot transmetatarsal plantar head first metatarsal head. Thick circumferential skin and callus around the wound edge I remove this down using a #5 curette. Minimal to no bleeding. The surface of the wound actually look quite healthy no evidence of surrounding infection. On the left dorsal forearm she has a skin tear. The superior flap does not look 100% viable. Electronic Signature(s) Signed: 07/20/2021 5:13:37 PM By: Karla Ham MD  Entered By: [name] on 07/20/2021 15:16:38 -------------------------------------------------------------------------------- Physician Orders Details Patient Name: Date of Service: [place], [name] 07/20/2021 2:15 PM Medical Record Number: [id number] Patient Account Number: [id number] Date of Birth/Sex: Treating RN: 09-28-1961 (60 y.o. [name] Primary Care Provider: [place] [place] Other Clinician: Referring Provider: Treating Provider/Extender: [name], Jairo Ben in Treatment: 21 Verbal / Phone Orders: No Diagnosis Coding ICD-10 Coding Code Description E02.233K Unspecified open wound, right foot, subsequent encounter 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 2 diabetes mellitus with foot ulcer E11.51 Type 2 diabetes mellitus with diabetic peripheral angiopathy without gangrene S81.802A Unspecified open wound, left lower leg, initial encounter I87.2 Venous insufficiency (chronic) (peripheral) Follow-up Appointments ppointment in 1 week. - with Dr. Dellia Nims Return  A Bathing/ Shower/ Hygiene May shower with protection but do not get wound dressing(s) wet. Edema Control - Lymphedema / SCD / Other Elevate legs to the level of the heart or above for 30 minutes daily and/or when sitting, a frequency of: - throughout the day Avoid standing for long periods of time. Patient to wear own compression stockings every day. - to left leg now Exercise regularly Off-Loading Open toe surgical shoe to: - with felt padding to offload Additional Orders / Instructions Stop/Decrease Smoking Follow Nutritious Diet Wound Treatment Wound #16 - Amputation Site - Transmetatarsal Wound Laterality: Plantar, Right Cleanser: Soap and Water (Home Health) 1 x Per Week/7 Days Discharge Instructions: May shower and wash wound with dial antibacterial soap and water prior to dressing change. Cleanser: Wound Cleanser (Home Health) 1 x Per Week/7 Days Discharge Instructions: Cleanse the wound with wound cleanser or normal saline prior to applying a clean dressing using gauze sponges, not tissue or cotton balls. Peri-Wound Care: Sween Lotion (Moisturizing lotion) (Home Health) 1 x Per Week/7 Days Discharge Instructions: Apply moisturizing lotion as directed Prim Dressing: Hydrofera Blue Classic Foam, 2x2 in (Home Health) 1 x Per Week/7 Days ary Discharge Instructions: Moisten with saline prior to applying to wound bed Secondary Dressing: Woven Gauze Sponge, Non-Sterile 4x4 in (Home Health) 1 x Per Week/7 Days Discharge Instructions: Apply over primary dressing as directed. Secondary Dressing: Optifoam Non-Adhesive Dressing, 4x4 in (Home Health) 1 x Per Week/7 Days Discharge Instructions: Foam donut to help offload Compression Wrap: Kerlix Roll 4.5x3.1 (in/yd) (Home Health) 1 x Per Week/7 Days Discharge Instructions: Apply Kerlix and Coban compression as directed. Compression Wrap: Coban Self-Adherent Wrap 4x5 (in/yd) (Home Health) 1 x Per Week/7 Days Discharge Instructions: Apply  over Kerlix as directed. Wound #21 - Forearm Wound Laterality: Left Cleanser: Soap and Water Discharge Instructions: May shower and wash wound with dial antibacterial soap and water prior to dressing change. Prim Dressing: Steri strips ary Secondary Dressing: T Non-adherent Dressing, 2x3 in elfa Discharge Instructions: Apply over primary dressing as directed. Secured With: [employer], 4.5x3.1 (in/yd) Discharge Instructions: Secure with Kerlix as directed. Secured With: 16M Medipore H Soft Cloth Surgical Tape, 2x2 (in/yd) Discharge Instructions: Secure dressing with tape as directed. Electronic Signature(s) Signed: 07/20/2021 5:13:37 PM By: Karla Ham MD Signed: 07/20/2021 5:42:35 PM By: Levan Hurst RN, BSN Entered By: Levan Hurst on 07/20/2021 15:08:11 -------------------------------------------------------------------------------- Problem List Details Patient Name: Date of Service: Karla Corwin, [name] 07/20/2021 2:15 PM Medical Record Number: [id number] Patient Account Number: [id number] Date of Birth/Sex: Treating RN: [birth year] (60 y.o. [name] Primary Care Provider: [place] Other Clinician: Referring Provider: Treating Provider/Extender: [name], Jairo Ben in Treatment: 21 Active Problems ICD-10 Encounter Code Description Active Date MDM Diagnosis  S91.301D Unspecified open wound, right foot, subsequent encounter 05/31/2021 No Yes 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 2 diabetes mellitus with foot ulcer 02/19/2021 No Yes E11.51 Type 2 diabetes mellitus with diabetic peripheral angiopathy without gangrene 02/19/2021 No Yes I87.2 Venous insufficiency (chronic) (peripheral) 06/18/2021 No Yes S40.812D Abrasion of left upper arm, subsequent encounter 07/20/2021 No Yes Inactive Problems ICD-10 Code Description Active Date Inactive Date L97.811 Non-pressure chronic ulcer of other part of right lower leg limited to breakdown of skin 02/19/2021 02/19/2021 S81.802A  Unspecified open wound, left lower leg, initial encounter 06/18/2021 06/18/2021 Resolved Problems ICD-10 Code Description Active Date Resolved Date S40.811D Abrasion of right upper arm, subsequent encounter 03/29/2021 03/29/2021 Electronic Signature(s) Signed: 07/20/2021 5:13:37 PM By: Karla Ham MD Entered By: [name] on 07/20/2021 15:09:17 -------------------------------------------------------------------------------- Progress Note Details Patient Name: Date of Service: Karla Corwin, [name] 07/20/2021 2:15 PM Medical Record Number: [id number] Patient Account Number: [id number] Date of Birth/Sex: Treating RN: August 04, 1961 (60 y.o. [name] Primary Care Provider: [place] [place] Other Clinician: Referring Provider: Treating Provider/Extender: [name], [name] in Treatment: 21 Subjective History of Present Illness (HPI) This 60 year old patient who has a very long significant history of diabetes mellitus, previous alcohol and nicotine abuse, chronic data disease, COPD, diabetes mellitus, height hypertension, critical lower limb ischemia with several wounds being managed at the wound Center at [facility] for over a year. She was recently in the ER at [facility] and was referred to our center. He has had a long history of critical limb ischemia and over a period of time has had balloon angioplasties in March 2016, endarterectomy of the left carotid, lower extremity angiogram and treatment by Dr. Quay Burow, several cardiac catheterizations. Most recently she had an x-ray of her left foot while in the ER which showed no acute abnormality. during this ER visit she was started on ciprofloxacin and asked to continue with the wound care physicians at [facility]. Her last ABI done in June 2017 showed the right side to be 0.28 in the left side to be 0.48. Her right toe brachial index was 0.17 on the right and 0.27 on the left. her last hemoglobin A1c  was 11.1% She continues to smoke about a pack of cigarettes a day. 03/28/2017 -- -- right foot x-ray -- IMPRESSION:Areas of soft tissue swelling. Mild subluxation second PIP joint. No frank dislocation or fracture. No erosive change or bony destruction. No soft tissue ulceration or radiopaque foreign body evident by radiography. There is plantar fascia calcification with a nearby inferior calcaneal spur. There are foci of arterial vascular calcification. 04/04/2017 -- the patient continues to be noncompliant and continues to complain of a lot of pain and has not done anything about quitting smoking Readmission: 06/26/18 on evaluation today patient presents for reevaluation she has not been seen in our office since May 2018. Since she was last seen here in our office she has undergone testing at Dr. Fransico Anaka Beazer office where it was revealed that she had an ABI of 0.68 with her previous ABI being 0.64 and a left ABI of 0.38 with previous ABI being 0.34 this study which was performed on 04/05/18 was pretty much equivalent to the study performed on 11/22/17. The findings in the end revealed on the right would appear to be moderate right lower extremity arterial disease on the left Dr. Gwenlyn Found states moderate in the report but unfortunately this appears to be much more severe at 0.38 compared to the right. Subsequently the patient was  scheduled to have angiography with Dr. Gwenlyn Found on 04/12/18. This was however canceled due to the fact that the patient was found to have chronic kidney disease stage III and it was to the point that he did not feel that it will be safe to pursue angiography at that point. She has not been on any antibiotics recently. At this point Dr. Gwenlyn Found has not rescheduled anything as far as the injured [place] according to the patient he is somewhat reluctant to do so. Nonetheless with her diminished blood flow this is gonna make it somewhat difficult for her to heal. 07/05/18; this is a  patient I have not seen previously. She has very significant PAD as noted above. She apparently has had revascularization efforts by Dr. Gwenlyn Found on the right on 3 occasions to the patient. She was supposed to have an attempted angiography on the left however this was canceled apparently because of stage III chronic renal failure. I will need to research all of this. She complains of significant pain in the wound and has claudication enough that when she walks to the end of the driveway she has to stop. She is been using silver alginate to the wounds on her legs and Iodoflex to the area on her left second toe. 07/13/18 on evaluation today patient's wounds actually appear to be doing about the same. She has an appointment she tells me within the next month that is September 2019 with Dr. Gwenlyn Found to discuss options to see if there's anything else that you can recommend or do for her. Nonetheless obviously what we're trying to do is do what we can to save her leg and in turn prevent any additional worsening or damage. None in the meantime we been mainly trying to manage her ulcers as best we can. 07/27/18; some improvement in the multiplicity of wounds on her left lower extremity and foot. She's been using silver alginate. I was unable to determine that she actually has an appointment with Dr. Gwenlyn Found. We are checking into this The patient's wound includes Left lateral foot, left plantar heel, her left anterior calf wound looks close to me, left fourth toe is very close to closed and the left medial calf is perhaps the largest open area READMISSION 02/19/2021 This is a now 60 year old woman with type 2 diabetes and continued cigarette smoking. She has known PAD. She was last in this clinic in 2019 at that point with wounds on her left foot. She left in a nonhealed state. On 06/28/2019 I see she had a left femoropopliteal by vein and vascular with an amputation of the fifth ray. These managed to heal over. Her  last arterial studies were in February 2021. This showed an absent waveform at the posterior tibial artery on the right a dampened monophasic dorsalis pedis on the right of 0.44. On the left again the PTA TA was absent her dorsalis pedis was 0.99 triphasic and her great toe pressure was 0.65. This would have been after her revascularization. Once again she tells me that Dr. Gwenlyn Found has done revascularization on the right leg on 3 different occasions. She has a right transmetatarsal amputation apparently done by Dr. Sharol Given remotely but I do not see a note for these right lower extremity revascularization but I may not of looked back far enough. In any case she says that the she has a wound on the plantar aspect of the right transmetatarsal amputation site there is been there for several months. More recently she fell and  had a wound on her right medial lower leg she had 5 sutures placed in the ER and then subsequently she has developed an area on the medial lower leg which was a blister that opened up. Past medical history includes type 2 diabetes, PAD, chronic renal failure, congestive heart failure, right transmetatarsal amputation, left fifth ray amputation, continued tobacco abuse, atrial fibrillation and cirrhosis. We did not attempt an ABI on the right leg today because of pain 02/26/2021; patient we admitted to the clinic last week. She has what looks to be 2 areas on her right medial and right anterior lower leg that look more like venous wounds but she has 1 on the first met head at the base of her right transmet. She has known severe PAD. She complains of a lot of pain although some of this may be neuropathic. Is difficult to exclude a component of claudication. Use silver alginate on the wounds on her legs and Iodoflex on the area on the first met head. She has an appointment with Dr. Gwenlyn Found on 5/25 although I will text him and discussed the situation. She is have poorly controlled diabetic. She  has has stage IIIb chronic renal failure 4/22; patient presents for 1 week follow-up. The 2 areas on her right medial and right anterior lower leg appear well-healing. She has been using silver alginate to this area without issues. She has a first met head ulcer and it is unclear how long this has been there as it was discovered in the ED earlier this month when she was being evaluated for another issue. Iodoflex has been used at this area. 4/29; patient presents for 1 week follow-up. She has been using silver alginate to the leg wounds and Iodoflex to the plantar foot wound. She has had this wrapped with Coban and Kerlix. She has no concerns or complaints today. 5/6; this is a difficult wound on the right plantar foot transmit site. We are not making a lot of progress. She had 2 more venous looking wounds on the right medial and right anterior lower leg 1 of which is healed. Her appointment with Dr. Gwenlyn Found is on 5/25 5/16; she did not tolerate the offloading shoe we gave her in fact she had a fall with an abrasion on her right forearm she is now back in the modified small shoe which does not offload her foot properly. Her appoint with Dr. Gwenlyn Found is still on 5/25 we have been using Iodoflex. The area on the right anterior lower leg is healed 7/18; patient presents for follow-up however has not been seen in 2 months. She was last seen at the end of May. She had a fall at the end of June and was hospitalized. She has been unable to follow-up since then. For the wound she has only been keeping it covered with gauze. She is scheduled for an aortogram with lower extremity runoff at the end of July. 7/29; patient presents for 2-week follow-up. She has home health and they have been changing her dressings. She denies any issues and has no complaints today. She states she had a procedure where they opened up one of her vessels in her legs. She denies signs of infection. 8/5; patient presents for follow-up.  She was recently in the hospital for bradycardia. She was noted to have cellulitis to the left lower extremity and Unna boot was placed due to blisters and increased swelling. She reports improvement to her left leg since being in the hospital and  stability check her right plantar foot wound. She denies signs of infection. 8/23; since I last saw this patient a lot has happened. She still has the area over the right first met head in the setting of previous transmetatarsal amputation. Firstly most importantly she underwent revascularization of her occluded right SFA. She underwent directional atherectomy followed by drug-coated balloon angioplasty. She has no named vessel below the knee hopefully the revascularization of the right SFA will improve collateral flow. It is not felt however that she has any endovascular options. She had follow-up arterial studies noninvasive on 8/9. These showed an ABI of 0.44 at the right PTA. Monophasic waveforms. On the left her great toe ABI was 0.68. Her follow-up arterial Doppler showed a 50 to 74% stenosis in the proximal SFA and a 50 to 74% stenosis in the distal SFA mild to severe atherosclerosis noted throughout the extremity. Areas of shadowing plaque seen, unable to rule out higher grade stenosis she also had a fall apparently wearing a right foot forefoot off loader that we gave her. She therefore comes in in an ordinary running shoe today. Not certain if this is the fall that ended up in the hospital with bradycardia 9/6 area over the right first metatarsal head in the setting of her previous amputation and severe PAD. She is also a diabetic with known PAD status post attempt at revascularization. She is continued smoker Again the separation of visits in the clinic is somewhat disturbing if we are going to consider her for a total contact cast. She is not wearing anything to offload this stating it causes imbalance especially the forefoot off loader. She  basically comes in in bedroom slippers She also had a fall this morning about an hour ago. She has a skin tear on the left dorsal forearm Objective Constitutional Sitting or standing Blood Pressure is within target range for patient.. Pulse regular and within target range for patient.[name] Respirations regular, non-labored and within target range.. Temperature is normal and within the target range for the patient.[name] Appears in no distress. Vitals Time Taken: 2:19 PM, Height: 64 in, Weight: 222 lbs, BMI: 38.1, Temperature: 98.2 F, Pulse: 94 bpm, Respiratory Rate: 22 breaths/min, Blood Pressure: 114/67 mmHg, Capillary Blood Glucose: 323 mg/dl. General Notes: Wound exam; right foot transmetatarsal plantar head first metatarsal head. Thick circumferential skin and callus around the wound edge I remove this down using a #5 curette. Minimal to no bleeding. The surface of the wound actually look quite healthy no evidence of surrounding infection. oo On the left dorsal forearm she has a skin tear. The superior flap does not look 100% viable. Integumentary (Hair, Skin) Wound #16 status is Open. Original cause of wound was Gradually Appeared. The date acquired was: 02/05/2021. The wound has been in treatment 21 weeks. The wound is located on the Right,Plantar Amputation Site - Transmetatarsal. The wound measures 1cm length x 1.6cm width x 0.4cm depth; 1.257cm^2 area and 0.503cm^3 volume. There is Fat Layer (Subcutaneous Tissue) exposed. There is no tunneling or undermining noted. There is a medium amount of serous drainage noted. The wound margin is thickened. There is large (67-100%) red granulation within the wound bed. There is a small (1-33%) amount of necrotic tissue within the wound bed including Adherent Slough. Wound #21 status is Open. Original cause of wound was Skin T ear/Laceration. The date acquired was: 07/20/2021. The wound is located on the Left Forearm. The wound measures 1.4cm length x 1.5cm  width x 0.1cm depth; 1.649cm^2 area  and 0.165cm^3 volume. There is Fat Layer (Subcutaneous Tissue) exposed. There is no tunneling or undermining noted. There is a medium amount of serosanguineous drainage noted. The wound margin is flat and intact. There is large (67-100%) red, pink granulation within the wound bed. There is no necrotic tissue within the wound bed. Assessment Active Problems ICD-10 Unspecified open wound, right foot, subsequent encounter Type 2 diabetes mellitus with foot ulcer Type 2 diabetes mellitus with diabetic peripheral angiopathy without gangrene Venous insufficiency (chronic) (peripheral) Abrasion of left upper arm, subsequent encounter Procedures Wound #16 Pre-procedure diagnosis of Wound #16 is a Diabetic Wound/Ulcer of the Lower Extremity located on the Right,Plantar Amputation Site - Transmetatarsal .Severity of Tissue Pre Debridement is: Fat layer exposed. There was a Selective/Open Wound Skin/Epidermis Debridement with a total area of 1.6 sq cm performed by [name]., MD. With the following instrument(s): Curette to remove Non-Viable tissue/material. Material removed includes Callus and Skin: Epidermis and. No specimens were taken. A time out was conducted at 14:48, prior to the start of the procedure. A Minimum amount of bleeding was controlled with Pressure. The procedure was tolerated well with a pain level of 0 throughout and a pain level of 0 following the procedure. Post Debridement Measurements: 1cm length x 1.6cm width x 0.4cm depth; 0.503cm^3 volume. Character of Wound/Ulcer Post Debridement is improved. Severity of Tissue Post Debridement is: Fat layer exposed. Post procedure Diagnosis Wound #16: Same as Pre-Procedure Plan Follow-up Appointments: Return Appointment in 1 week. - with Dr. Arcola Jansky Shower/ Hygiene: May shower with protection but do not get wound dressing(s) wet. Edema Control - Lymphedema / SCD / Other: Elevate legs to  the level of the heart or above for 30 minutes daily and/or when sitting, a frequency of: - throughout the day Avoid standing for long periods of time. Patient to wear own compression stockings every day. - to left leg now Exercise regularly Off-Loading: Open toe surgical shoe to: - with felt padding to offload Additional Orders / Instructions: Stop/Decrease Smoking Follow Nutritious Diet WOUND #16: - Amputation Site - Transmetatarsal Wound Laterality: Plantar, Right Cleanser: Soap and Water (Home Health) 1 x Per Week/7 Days Discharge Instructions: May shower and wash wound with dial antibacterial soap and water prior to dressing change. Cleanser: Wound Cleanser (Home Health) 1 x Per Week/7 Days Discharge Instructions: Cleanse the wound with wound cleanser or normal saline prior to applying a clean dressing using gauze sponges, not tissue or cotton balls. Peri-Wound Care: Sween Lotion (Moisturizing lotion) (Home Health) 1 x Per Week/7 Days Discharge Instructions: Apply moisturizing lotion as directed Prim Dressing: Hydrofera Blue Classic Foam, 2x2 in (Home Health) 1 x Per Week/7 Days ary Discharge Instructions: Moisten with saline prior to applying to wound bed Secondary Dressing: Woven Gauze Sponge, Non-Sterile 4x4 in (Home Health) 1 x Per Week/7 Days Discharge Instructions: Apply over primary dressing as directed. Secondary Dressing: Optifoam Non-Adhesive Dressing, 4x4 in (Home Health) 1 x Per Week/7 Days Discharge Instructions: Foam donut to help offload Com pression Wrap: Kerlix Roll 4.5x3.1 (in/yd) (Home Health) 1 x Per Week/7 Days Discharge Instructions: Apply Kerlix and Coban compression as directed. Com pression Wrap: Coban Self-Adherent Wrap 4x5 (in/yd) (Home Health) 1 x Per Week/7 Days Discharge Instructions: Apply over Kerlix as directed. WOUND #21: - Forearm Wound Laterality: Left Cleanser: Soap and Water Discharge Instructions: May shower and wash wound with dial  antibacterial soap and water prior to dressing change. Prim Dressing: Steri strips ary Secondary Dressing: T Non-adherent Dressing, 2x3  in elfa Discharge Instructions: Apply over primary dressing as directed. Secured With: [employer], 4.5x3.1 (in/yd) Discharge Instructions: Secure with Kerlix as directed. Secured With: 35M Medipore H Soft Cloth Surgical T ape, 2x2 (in/yd) Discharge Instructions: Secure dressing with tape as directed. 1. Post debridement we are going to use Hydrofera Blue 2. She has been discharged by home health I am not sure that she has anybody reliable to help her with the dressing I am therefore going going to put her in kerlix Coban 3. I had some thoughts about a total contact cast. She is not an option for any further revascularization however I want to convince myself she can arrive in clinic on a weekly basis 4. The skin tear on the left arm we will Steri-Strip however I am at least a bit concerned that the superior skin flap is not going to be viable and will need to be removed Electronic Signature(s) Signed: 07/20/2021 3:19:52 PM By: Karla Ham MD Entered By: [name] on 07/20/2021 15:19:52 -------------------------------------------------------------------------------- SuperBill Details Patient Name: Date of Service: Karla Corwin, [name] 07/20/2021 Medical Record Number: [id number] Patient Account Number: [id number] Date of Birth/Sex: Treating RN: Sep 17, 1961 (60 y.o. [name] Primary Care Provider: [place] [place] Other Clinician: Referring Provider: Treating Provider/Extender: [name], [name] in Treatment: 21 Diagnosis Coding ICD-10 Codes Code Description B37.943E Unspecified open wound, right foot, subsequent encounter 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 2 diabetes mellitus with foot ulcer E11.51 Type 2 diabetes mellitus with diabetic peripheral angiopathy without gangrene I87.2 Venous insufficiency (chronic)  (peripheral) S40.812D Abrasion of left upper arm, subsequent encounter Facility Procedures CPT4 Code: [id number] Description: [id number] - DEBRIDE WOUND 1ST 20 SQ CM OR < ICD-10 Diagnosis Description S91.301D Unspecified open wound, right foot, subsequent encounter 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 2 diabetes mellitus with foot ulcer Modifier: Quantity: 1 Physician Procedures : CPT4 Code Description Modifier [id number] - WC PHYS DEBR WO ANESTH 20 SQ CM ICD-10 Diagnosis Description S91.301D Unspecified open wound, right foot, subsequent encounter 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 2 diabetes mellitus with foot ulcer Quantity: 1 Electronic Signature(s) Signed: 07/20/2021 5:13:37 PM By: Karla Ham MD Entered By: [name] on 07/20/2021 15:20:10

## 2021-07-22 ENCOUNTER — Other Ambulatory Visit (HOSPITAL_COMMUNITY): Payer: Self-pay

## 2021-07-22 ENCOUNTER — Telehealth: Payer: Self-pay

## 2021-07-22 MED ORDER — COLCHICINE 0.6 MG PO TABS: 0.6000 mg | ORAL_TABLET | Freq: Every day | ORAL | 3 refills | Status: DC

## 2021-07-22 NOTE — Telephone Encounter (Signed)
Message sent to Marylouise Stacks, EMT notifying her that the rx has been sent.

## 2021-07-22 NOTE — Progress Notes (Signed)
JOURDEN, HOPKINS ([id number]) Visit Report for 07/20/2021 Arrival Information Details Patient Name: Date of Service: [name] 07/20/2021 2:15 PM Medical Record Number: [id number] Patient Account Number: [id number] Date of Birth/Sex: Treating RN: 04-Sep-1961 (60 y.o. [name] Primary Care Undrea Shipes: [place] Other Clinician: Referring Brytni Dray: Treating Haidy Kackley/Extender: Johnn Hai, Jairo Ben in Treatment: 21 Visit Information History Since Last Visit Added or deleted any medications: No Patient Arrived: Gilford Rile Any new allergies or adverse reactions: No Arrival Time: 14:19 Had a fall or experienced change in No Accompanied By: self activities of daily living that may affect Transfer Assistance: None risk of falls: Patient Identification Verified: Yes Signs or symptoms of abuse/neglect since last visito No Secondary Verification Process Completed: Yes Hospitalized since last visit: No Patient Requires Transmission-Based Precautions: No Implantable device outside of the clinic excluding No Patient Has Alerts: No cellular tissue based products placed in the center since last visit: Has Dressing in Place as Prescribed: Yes Pain Present Now: Yes Electronic Signature(s) Signed: 07/22/2021 11:10:38 AM By: Sandre Kitty Entered By: Sandre Kitty on 07/20/2021 14:19:35 -------------------------------------------------------------------------------- Encounter Discharge Information Details Patient Name: Date of Service: [name], [name] 07/20/2021 2:15 PM Medical Record Number: [id number] Patient Account Number: [id number] Date of Birth/Sex: Treating RN: January 23, 1961 (60 y.o. [name] Primary Care Chanoch Mccleery: [place] Other Clinician: Referring Kennie Snedden: Treating Junetta Hearn/Extender: Johnn Hai, Romelle Starcher Weeks in Treatment: 21 Encounter Discharge Information Items Post Procedure Vitals Discharge Condition:  Stable Temperature (F): 98.2 Ambulatory Status: Walker Pulse (bpm): 94 Discharge Destination: Home Respiratory Rate (breaths/min): 22 Transportation: Private Auto Blood Pressure (mmHg): 114/67 Accompanied By: alone Schedule Follow-up Appointment: Yes Clinical Summary of Care: Patient Declined Electronic Signature(s) Signed: 07/20/2021 5:42:35 PM By: Levan Hurst RN, BSN Entered By: Levan Hurst on 07/20/2021 17:38:01 -------------------------------------------------------------------------------- Lower Extremity Assessment Details Patient Name: Date of Service: [name], [name] 07/20/2021 2:15 PM Medical Record Number: [id number] Patient Account Number: [id number] Date of Birth/Sex: Treating RN: [birth year] (60 y.o. [name] Primary Care Neri Samek: [place] Other Clinician: Referring Jonny Dearden: Treating Chryl Holten/Extender: Johnn Hai, [name] in Treatment: 21 Edema Assessment Assessed: [Left: No] [Right: No] Edema: [Left: Yes] [Right: Yes] Calf Left: Right: Point of Measurement: From Medial Instep 44 cm 45 cm Ankle Left: Right: Point of Measurement: From Medial Instep 22.8 cm 22.5 cm Vascular Assessment Pulses: Dorsalis Pedis Palpable: [Right:Yes] Electronic Signature(s) Signed: 07/20/2021 5:42:35 PM By: Levan Hurst RN, BSN Entered By: Levan Hurst on 07/20/2021 14:47:21 -------------------------------------------------------------------------------- Multi Wound Chart Details Patient Name: Date of Service: [name], [name] 07/20/2021 2:15 PM Medical Record Number: [id number] Patient Account Number: [id number] Date of Birth/Sex: Treating RN: 13-Mar-1961 (60 y.o. [name] Primary Care Eydie Wormley: [place] Other Clinician: Referring Mikaele Stecher: Treating Cabe Lashley/Extender: Johnn Hai, [name] in Treatment: 21 Vital Signs Height(in): 64 Capillary Blood Glucose(mg/dl): 323 Weight(lbs):  222 Pulse(bpm): 96 Body Mass Index(BMI): 82 Blood Pressure(mmHg): 114/67 Temperature(F): 98.2 Respiratory Rate(breaths/min): 26 Photos: [16:Right, Plantar Amputation Site -] [21:Left Forearm] [N/A:N/A N/A] Wound Location: [16:Transmetatarsal Gradually Appeared] [21:Skin Tear/Laceration] [N/A:N/A] Wounding Event: [16:Diabetic Wound/Ulcer of the Lower] [21:Trauma, Other] [N/A:N/A] Primary Etiology: [16:Extremity Chronic Obstructive Pulmonary] [21:Chronic Obstructive Pulmonary] [N/A:N/A] Comorbid History: [16:Disease (COPD), Sleep Apnea, Arrhythmia, Congestive Heart Failure, Coronary Artery Disease, Hypertension, Peripheral Arterial Hypertension, Peripheral Arterial Disease, Cirrhosis , Type II Diabetes, Osteoarthritis, Neuropathy  02/05/2021] [21:Disease (COPD), Sleep Apnea, Coronary Artery Disease, Osteoarthritis, Neuropathy 07/20/2021] [N/A:N/A] Date Acquired: [16:21] [21:0] [N/A:N/A] Weeks of Treatment: [16:Open] [21:Open] [N/A:N/A] Wound  Status: [16:1x1.6x0.4] [21:1.4x1.5x0.1] [N/A:N/A] Measurements L x W x D (cm) [16:1.257] [21:1.649] [N/A:N/A] A (cm) : rea [id number] [21:0.165] [N/A:N/A] Volume (cm) : [16:23.10%] [21:0.00%] [N/A:N/A] % Reduction in A [16:rea: -2.70%] [21:0.00%] [N/A:N/A] % Reduction in Volume: [16:Grade 2] [21:Full Thickness Without Exposed] [N/A:N/A] Classification: [16:Medium] [21:Support Structures Medium] [N/A:N/A] Exudate A mount: [16:Serous] [21:Serosanguineous] [N/A:N/A] Exudate Type: [16:amber] [21:red, brown] [N/A:N/A] Exudate Color: [16:Thickened] [21:Flat and Intact] [N/A:N/A] Wound Margin: [16:Large (67-100%)] [21:Large (67-100%)] [N/A:N/A] Granulation A mount: [16:Red] [21:Red, Pink] [N/A:N/A] Granulation Quality: [16:Small (1-33%)] [21:None Present (0%)] [N/A:N/A] Necrotic A mount: [16:Fat Layer (Subcutaneous Tissue): Yes Fat Layer (Subcutaneous Tissue): Yes N/A] Exposed Structures: [16:Fascia: No Tendon: No Muscle: No Joint: No Bone: No None]  [21:Fascia: No Tendon: No Muscle: No Joint: No Bone: No None] [N/A:N/A] Epithelialization: [16:Debridement - Selective/Open Wound N/A] [N/A:N/A] Debridement: Pre-procedure Verification/Time Out 14:48 [21:N/A] [N/A:N/A] Taken: [16:Callus] [21:N/A] [N/A:N/A] Tissue Debrided: [16:Skin/Epidermis] [21:N/A] [N/A:N/A] Level: [16:1.6] [21:N/A] [N/A:N/A] Debridement A (sq cm): [16:rea Curette] [21:N/A] [N/A:N/A] Instrument: [16:Minimum] [21:N/A] [N/A:N/A] Bleeding: [16:Pressure] [21:N/A] [N/A:N/A] Hemostasis A chieved: [16:0] [21:N/A] [N/A:N/A] Procedural Pain: [16:0] [21:N/A] [N/A:N/A] Post Procedural Pain: [16:Procedure was tolerated well] [21:N/A] [N/A:N/A] Debridement Treatment Response: [16:1x1.6x0.4] [21:N/A] [N/A:N/A] Post Debridement Measurements L x W x D (cm) [16:0.503] [21:N/A] [N/A:N/A] Post Debridement Volume: (cm) [16:Debridement] [21:N/A] [N/A:N/A] Treatment Notes Electronic Signature(s) Signed: 07/20/2021 5:13:37 PM By: Linton Ham MD Signed: 07/20/2021 5:22:11 PM By: [name] RN Entered By: Linton Ham on 07/20/2021 15:09:25 -------------------------------------------------------------------------------- Multi-Disciplinary Care Plan Details Patient Name: Date of Service: [name], [name] 07/20/2021 2:15 PM Medical Record Number: [id number] Patient Account Number: [id number] Date of Birth/Sex: Treating RN: 1961/05/10 (60 y.o. [name] Primary Care Talynn Lebon: [place] Other Clinician: Referring Kentravious Lipford: Treating Navaeh Kehres/Extender: Johnn Hai, [name] in Treatment: 21 Active Inactive Wound/Skin Impairment Nursing Diagnoses: Impaired tissue integrity Goals: Patient/caregiver will verbalize understanding of skin care regimen Date Initiated: 02/19/2021 Target Resolution Date: 08/13/2021 Goal Status: Active Ulcer/skin breakdown will have a volume reduction of 30% by week 4 Date Initiated: 02/19/2021 Date Inactivated:  03/29/2021 Target Resolution Date: 04/09/2021 Goal Status: Unmet Unmet Reason: PAD Interventions: Assess patient/caregiver ability to obtain necessary supplies Assess patient/caregiver ability to perform ulcer/skin care regimen upon admission and as needed Assess ulceration(s) every visit Provide education on ulcer and skin care Treatment Activities: Skin care regimen initiated : 02/19/2021 Topical wound management initiated : 02/19/2021 Notes: Electronic Signature(s) Signed: 07/20/2021 5:42:35 PM By: Levan Hurst RN, BSN Entered By: Levan Hurst on 07/20/2021 17:36:44 -------------------------------------------------------------------------------- Pain Assessment Details Patient Name: Date of Service: [name], [name] 07/20/2021 2:15 PM Medical Record Number: [id number] Patient Account Number: [id number] Date of Birth/Sex: Treating RN: 1961-07-15 (60 y.o. [name] Primary Care Antolin Belsito: [place] Other Clinician: Referring Aren Cherne: Treating Carlyon Nolasco/Extender: Johnn Hai, [name] in Treatment: 21 Active Problems Location of Pain Severity and Description of Pain Patient Has Paino No Site Locations Pain Management and Medication Current Pain Management: Electronic Signature(s) Signed: 07/20/2021 5:22:11 PM By: [name] RN Signed: 07/22/2021 11:10:38 AM By: Sandre Kitty Entered By: Sandre Kitty on 07/20/2021 14:20:19 -------------------------------------------------------------------------------- Patient/Caregiver Education Details Patient Name: Date of Service: [name] 9/6/2022andnbsp2:15 PM Medical Record Number: [id number] Patient Account Number: [id number] Date of Birth/Gender: Treating RN: 25-Nov-1960 (60 y.o. [name] Primary Care Physician: [place] Other Clinician: Referring Physician: Treating Physician/Extender: Johnn Hai, Jairo Ben in Treatment: 21 Education  Assessment Education Provided To: Patient Education Topics Provided Wound/Skin Impairment: Methods: Explain/Verbal Responses: State content correctly  Electronic Signature(s) Signed: 07/20/2021 5:42:35 PM By: Levan Hurst RN, BSN Entered By: Levan Hurst on 07/20/2021 17:36:55 -------------------------------------------------------------------------------- Wound Assessment Details Patient Name: Date of Service: [name], [name] 07/20/2021 2:15 PM Medical Record Number: [id number] Patient Account Number: [id number] Date of Birth/Sex: Treating RN: 09-06-1961 (60 y.o. [name] Primary Care Yamira Papa: [place] Other Clinician: Referring Jahmiyah Dullea: Treating Damarion Mendizabal/Extender: Johnn Hai, [name] in Treatment: 21 Wound Status Wound Number: 16 Primary Diabetic Wound/Ulcer of the Lower Extremity Etiology: Wound Location: Right, Plantar Amputation Site - Transmetatarsal Wound Open Wounding Event: Gradually Appeared Status: Date Acquired: 02/05/2021 Comorbid Chronic Obstructive Pulmonary Disease (COPD), Sleep Apnea, Weeks Of Treatment: 21 History: Arrhythmia, Congestive Heart Failure, Coronary Artery Disease, Clustered Wound: No Hypertension, Peripheral Arterial Disease, Cirrhosis , Type II Diabetes, Osteoarthritis, Neuropathy Photos Wound Measurements Length: (cm) 1 Width: (cm) 1.6 Depth: (cm) 0.4 Area: (cm) 1.257 Volume: (cm) 0.503 % Reduction in Area: 23.1% % Reduction in Volume: -2.7% Epithelialization: None Tunneling: No Undermining: No Wound Description Classification: Grade 2 Wound Margin: Thickened Exudate Amount: Medium Exudate Type: Serous Exudate Color: amber Foul Odor After Cleansing: No Slough/Fibrino Yes Wound Bed Granulation Amount: Large (67-100%) Exposed Structure Granulation Quality: Red Fascia Exposed: No Necrotic Amount: Small (1-33%) Fat Layer (Subcutaneous Tissue) Exposed: Yes Necrotic Quality: Adherent  Slough Tendon Exposed: No Muscle Exposed: No Joint Exposed: No Bone Exposed: No Treatment Notes 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 Wound Cleanser Discharge Instruction: Cleanse the wound with wound cleanser or normal saline prior to applying a clean dressing using gauze sponges, not tissue or cotton balls. Peri-Wound Care Sween Lotion (Moisturizing lotion) Discharge Instruction: Apply moisturizing lotion as directed Topical Primary Dressing Hydrofera Blue Classic Foam, 2x2 in Discharge Instruction: Moisten with saline prior to applying to wound bed Secondary Dressing Woven Gauze Sponge, Non-Sterile 4x4 in Discharge Instruction: Apply over primary dressing as directed. Optifoam Non-Adhesive Dressing, 4x4 in Discharge Instruction: Foam donut to help offload Secured With Compression Wrap Kerlix Roll 4.5x3.1 (in/yd) Discharge Instruction: Apply Kerlix and Coban compression as directed. Coban Self-Adherent Wrap 4x5 (in/yd) Discharge Instruction: Apply over Kerlix as directed. Compression Stockings Add-Ons Electronic Signature(s) Signed: 07/20/2021 5:42:35 PM By: Levan Hurst RN, BSN Entered By: Levan Hurst on 07/20/2021 14:49:22 -------------------------------------------------------------------------------- Wound Assessment Details Patient Name: Date of Service: [name], [name] 07/20/2021 2:15 PM Medical Record Number: [id number] Patient Account Number: [id number] Date of Birth/Sex: Treating RN: 1961/03/06 (60 y.o. [name] Primary Care Arch Methot: [place] Other Clinician: Referring Deniece Rankin: Treating Nickalas Mccarrick/Extender: Johnn Hai, [name] in Treatment: 21 Wound Status Wound Number: 21 Primary Trauma, Other Etiology: Wound Location: Left Forearm Wound  Open Wounding Event: Skin Tear/Laceration Status: Date Acquired: 07/20/2021 Comorbid Chronic Obstructive Pulmonary Disease (COPD), Sleep Apnea, Weeks Of Treatment: 0 History: Arrhythmia, Congestive Heart Failure, Coronary Artery Disease, Clustered Wound: No Hypertension, Peripheral Arterial Disease, Cirrhosis , Type II Diabetes, Osteoarthritis, Neuropathy Photos Wound Measurements Length: (cm) 1.4 Width: (cm) 1.5 Depth: (cm) 0.1 Area: (cm) 1.649 Volume: (cm) 0.165 % Reduction in Area: 0% % Reduction in Volume: 0% Epithelialization: None Tunneling: No Undermining: No Wound Description Classification: Full Thickness Without Exposed Support Structures Wound Margin: Flat and Intact Exudate Amount: Medium Exudate Type: Serosanguineous Exudate Color: red, brown Foul Odor After Cleansing: No Slough/Fibrino No Wound Bed Granulation Amount: Large (67-100%) Exposed Structure Granulation Quality: Red, Pink Fascia Exposed:  No Necrotic Amount: None Present (0%) Fat Layer (Subcutaneous Tissue) Exposed: Yes Tendon Exposed: No Muscle Exposed: No Joint Exposed: No Bone Exposed: No Treatment Notes Wound #21 (Forearm) Wound Laterality: Left Cleanser Soap and Water Discharge Instruction: May shower and wash wound with dial antibacterial soap and water prior to dressing change. Peri-Wound Care Topical Primary Dressing Steri strips Secondary Dressing T Non-adherent Dressing, 2x3 in elfa Discharge Instruction: Apply over primary dressing as directed. Secured With [employer], 4.5x3.1 (in/yd) Discharge Instruction: Secure with Kerlix as directed. 26M Medipore H Soft Cloth Surgical T ape, 2x2 (in/yd) Discharge Instruction: Secure dressing with tape as directed. Compression Wrap Compression Stockings Add-Ons Electronic Signature(s) Signed: 07/20/2021 5:42:35 PM By: Levan Hurst RN, BSN Entered By: Levan Hurst on 07/20/2021  14:48:40 -------------------------------------------------------------------------------- Vitals Details Patient Name: Date of Service: [name], [name] 07/20/2021 2:15 PM Medical Record Number: [id number] Patient Account Number: [id number] Date of Birth/Sex: Treating RN: [birth year] (60 y.o. [name] Primary Care Jojo Geving: [place] Other Clinician: Referring Tagen Brethauer: Treating Zyla Dascenzo/Extender: Johnn Hai, [name] in Treatment: 21 Vital Signs Time Taken: 14:19 Temperature (F): 98.2 Height (in): 64 Pulse (bpm): 94 Weight (lbs): 222 Respiratory Rate (breaths/min): 22 Body Mass Index (BMI): 38.1 Blood Pressure (mmHg): 114/67 Capillary Blood Glucose (mg/dl): 323 Reference Range: 80 - 120 mg / dl Electronic Signature(s) Signed: 07/22/2021 11:10:38 AM By: Sandre Kitty Entered By: Sandre Kitty on 07/20/2021 14:20:10

## 2021-07-22 NOTE — Telephone Encounter (Signed)
Message received from Marylouise Stacks, EMT requesting a refill for colchicine be sent to [place] [facility].  Thanks

## 2021-07-22 NOTE — Progress Notes (Addendum)
Paramedicine Encounter    Patient ID: [name], female    DOB: [birth year], 60 y.o.   MRN: [id number]   Patient Care Team: [facility] as PCP - General Gwenlyn Found, [name], MD as PCP - Cardiology (Cardiology) [name], MD as PCP - Electrophysiology (Cardiology) [name], MD as Consulting Physician (Cardiology) [name], MD as Consulting Physician (Pulmonary Disease) System, Provider Not In  Patient Active Problem List   Diagnosis Date Noted   Bradycardia 06/13/2021   Uncontrolled type 2 diabetes mellitus with hyperglycemia ([place]) 06/13/2021   Acute kidney injury superimposed on CKD ([place]) 06/13/2021   Critical ischemia of foot ([place]) 06/07/2021   Fall 05/05/2021   PAF (paroxysmal atrial fibrillation) ([place]) 05/05/2021   COPD (chronic obstructive pulmonary disease) ([place]) 05/05/2021   DM (diabetes mellitus), type 2 with complications ([place]) 99991111   PAD (peripheral artery disease) ([place]) 05/05/2021   Cellulitis 05/05/2021   Acute on chronic combined systolic and diastolic CHF (congestive heart failure) ([place]) 05/05/2021   OSA (obstructive sleep apnea) 05/05/2021   CKD (chronic kidney disease) stage 3, GFR 30-59 ml/min ([place]) 05/05/2021   AKI (acute kidney injury) ([place]) 05/04/2021   Pressure injury of skin 05/04/2021   Ulcer of foot, unspecified laterality, limited to breakdown of skin ([place]) 02/17/2021   Altered mental status 01/02/2021   AF (paroxysmal atrial fibrillation) ([place]) 01/02/2021   CAD (coronary artery disease) 01/02/2021   PVD (peripheral vascular disease) ([place]) 01/02/2021   Acute renal failure superimposed on stage 4 chronic kidney disease ([place]rock[place]) 01/02/2021   Diabetes mellitus type 2, uncontrolled, with complications ([place]) [id number]   Diabetic nephropathy ([place]) 01/02/2021   Low back pain 06/01/2020   Hyperlipidemia 04/03/2020   Muscle weakness 03/20/2020   Closed fracture of neck of right radius 03/13/2020   Pain in elbow  03/12/2020   PAD (peripheral artery disease) ([place]) 12/26/2019   Acute renal failure ([name])    Acute respiratory failure with hypoxia ([place]) 12/02/2019   Acute exacerbation of CHF (congestive heart failure) ([place]) 12/01/2019   Cellulitis in diabetic foot ([place]) 07/08/2019   Osteomyelitis ([place]) 06/21/2019   NICM (nonischemic cardiomyopathy) ([place]) 06/20/2019   Non-healing ulcer ([place]) 06/20/2019   Acute CHF (congestive heart failure) ([place]) 11/26/2018   CHF (congestive heart failure) ([place]) 11/26/2018   Acute respiratory failure ([place]) 10/21/2018   AKI (acute kidney injury) ([place]) 08/18/2018   Coagulation disorder ([place]) 08/09/2017   Depression 07/21/2017   At risk for adverse drug reaction 06/20/2017   Peripheral neuropathy 06/20/2017   Acute osteomyelitis of right foot ([place]) 06/13/2017   S/P transmetatarsal amputation of foot, right ([place]) 06/05/2017   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 ([place]) 05/19/2017   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[place])    SIRS (systemic inflammatory response syndrome) ([place]) 04/06/2017   CKD (chronic kidney disease), stage III ([place]) 11/24/2016   Suspected sleep apnea 11/24/2016   Ulcer of toe of right foot, with necrosis of bone ([place]) 10/27/2016   Ulcer of left lower leg ([place]) 05/19/2016   Severe obesity (BMI >= 40) ([place]el) 02/24/2016   COPD GOLD 0  02/24/2016   Morbid obesity ([place]) 02/24/2016   Encounter for therapeutic drug monitoring 02/10/2016   Symptomatic bradycardia 01/12/2016   Essential hypertension 12/22/2015   Chronic combined systolic and diastolic CHF (congestive heart failure) ([place]arnham)    Wheeze    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 11/08/2015   Tobacco abuse 10/23/2015   Coronary artery disease  DOE (dyspnea on exertion) 04/29/2015   Diabetes mellitus type 2, uncontrolled (Clyman) 02/08/2015   Influenza A 02/07/2015   Wrist fracture, left, with routine healing, subsequent encounter 02/05/2015   Wrist fracture,  left, closed, initial encounter 01/29/2015   PAF (paroxysmal atrial fibrillation) ([place]) 01/16/2015   Carotid arterial disease ([place]) 01/16/2015   Claudication ([place] Lake) 01/15/2015   Demand ischemia ([place]) 10/29/2014   Insomnia 02/03/2014   COPD with acute exacerbation ([place]gu[place]) 02/01/2014   S/P peripheral artery angioplasty - TurboHawk atherectomy; R SFA 09/11/2013    Class: Acute   Leg pain, bilateral 08/19/2013   Hypothyroidism 07/31/2013   Cellulitis 06/13/2013   History of cocaine abuse ([place]) 06/13/2013   Long term current use of anticoagulant therapy 05/20/2013   Alcohol abuse    Narcotic abuse ([place])    Marijuana abuse    Alcoholic cirrhosis ([place])    DM (diabetes mellitus), type 2 with peripheral vascular complications ([name])     Current Outpatient Medications:    ACCU-CHEK GUIDE test strip, USE TO CHECK BLOOD SUGAR FOUR TIMES DAILY, Disp: , Rfl:    acetaminophen (TYLENOL) 500 MG tablet, Take 500 mg by mouth every 6 (six) hours as needed for moderate pain., Disp: , Rfl:    albuterol (VENTOLIN HFA) 108 (90 Base) MCG/ACT inhaler, Inhale 2 puffs into the lungs every 6 (six) hours as needed for wheezing or shortness of breath., Disp: 18 g, Rfl: 0   amiodarone (PACERONE) 200 MG tablet, Take 0.5 tablets (100 mg total) by mouth daily., Disp: 45 tablet, Rfl: 3   amLODipine (NORVASC) 10 MG tablet, Take 1 tablet (10 mg total) by mouth daily., Disp: 90 tablet, Rfl: 3   apixaban (ELIQUIS) 5 MG TABS tablet, Take 5 mg by mouth 2 (two) times daily., Disp: , Rfl:    atorvastatin (LIPITOR) 40 MG tablet, Take 40 mg by mouth every evening., Disp: , Rfl:    budesonide-formoterol (SYMBICORT) 160-4.5 MCG/ACT inhaler, Inhale 2 puffs into the lungs 2 (two) times daily., Disp: 1 Inhaler, Rfl: 2   clopidogrel (PLAVIX) 75 MG tablet, Take 1 tablet (75 mg total) by mouth daily with breakfast., Disp: 90 tablet, Rfl: 1   Continuous Blood Gluc Sensor (FREESTYLE LIBRE 2 SENSOR) MISC, USE 1 (ONE) EACH EVERY 2 WEEKS, Disp: 2  each, Rfl: 11   Dulaglutide (TRULICITY) A999333 [id number] SOPN, Inject 0.75 mg into the skin every Friday., Disp: 2 mL, Rfl: 6   empagliflozin (JARDIANCE) 10 MG TABS tablet, Take 1 tablet (10 mg total) by mouth daily before breakfast., Disp: 90 tablet, Rfl: 3   FLUoxetine (PROZAC) 20 MG capsule, Take 1 capsule (20 mg total) by mouth at bedtime., Disp: 90 capsule, Rfl: 1   folic acid (FOLVITE) 1 MG tablet, Take 1 tablet (1 mg total) by mouth daily., Disp: 90 tablet, Rfl: 1   hydrALAZINE (APRESOLINE) 25 MG tablet, Take 1 tablet (25 mg total) by mouth 3 (three) times daily., Disp: 90 tablet, Rfl: 11   insulin glargine (LANTUS) 100 UNIT/ML Solostar Pen, Inject 30 Units into the skin daily., Disp: 30 mL, Rfl: 6   insulin lispro (HUMALOG) 100 UNIT/ML KwikPen, Inject 8 Units into the skin 3 (three) times daily with meals., Disp: 15 mL, Rfl: 0   Insulin Pen Needle 32G X 4 MM MISC, USE AS DIRECTED WITH INSULIN PENS, Disp: 100 each, Rfl: 0   levothyroxine (SYNTHROID, LEVOTHROID) 50 MCG tablet, Take 1 tablet (50 mcg total) by mouth daily., Disp: 30 tablet, Rfl: 0   losartan (COZAAR)  100 MG tablet, Take 1 tablet (100 mg total) by mouth daily. (Patient taking differently: Take 100 mg by mouth at bedtime.), Disp: 30 tablet, Rfl: 6   nortriptyline (PAMELOR) 50 MG capsule, Take 50 mg by mouth 2 (two) times daily., Disp: , Rfl:    Omega-3 Fatty Acids (FISH OIL PO), Take 1 capsule by mouth daily., Disp: , Rfl:    pantoprazole (PROTONIX) 40 MG tablet, Take 1 tablet (40 mg total) by mouth daily., Disp:  , Rfl:    torsemide (DEMADEX) 20 MG tablet, Take 4 tablets (80 mg total) by mouth every morning AND 3 tablets (60 mg total) every evening., Disp: 210 tablet, Rfl: 11   VITAMIN D PO, Take 1 tablet by mouth daily., Disp: , Rfl:    colchicine 0.6 MG tablet, Take 0.6 mg by mouth daily. (Patient not taking: Reported on 07/22/2021), Disp: , Rfl:  Allergies  Allergen Reactions   Gabapentin Nausea And Vomiting and Other (See  Comments)    POSSIBLE SHAKING   Lyrica [Pregabalin] Other (See Comments)    Shaking       Social History   Socioeconomic History   Marital status: Single    Spouse name: Not on file   Number of children: 1   Years of education: 12   Highest education level: 12th grade  Occupational History   Occupation: disabled  Tobacco Use   Smoking status: Every Day    Packs/day: 1.00    Years: 44.00    Pack years: 44.00    Types: E-cigarettes, Cigarettes   Smokeless tobacco: Former    Types: Snuff  Vaping Use   Vaping Use: Former   Devices: 11/26/2018 "stopped months ago"  Substance and Sexual Activity   Alcohol use: Yes    Comment: occ   Drug use: Yes    Types: "Crack" cocaine, Marijuana, Oxycodone   Sexual activity: Not Currently  Other Topics Concern   Not on file  Social History Narrative   ** Merged History Encounter **       Lives in [place], in motel with sister.  They are looking to move but don't have a place to go yet.     Social Determinants of Health   Financial Resource Strain: Low Risk    Difficulty of Paying Living Expenses: Not very hard  Food Insecurity: Not on file  Transportation Needs: Not on file  Physical Activity: Not on file  Stress: Not on file  Social Connections: Not on file  Intimate Partner Violence: Not on file    Physical Exam      Future Appointments  Date Time Provider [place]  07/27/2021  2:00 PM Ricard Dillon, MD [facility]  07/27/2021  3:15 PM [facility] None  08/11/2021  2:10 PM Charlott Rakes, MD [facility] None  10/01/2021  2:30 PM [name], MD [facility]  10/21/2021  1:40 PM Larey Dresser, MD [facility] None  12/22/2021  1:00 PM [facility] 3 [facility]    BP (!) 146/70   Pulse 87   Resp 18   Wt 225 lb (102.1 kg)   LMP  (LMP Unknown)   SpO2 97%   BMI 38.62 kg/m   Weight yesterday-? Last visit weight-223  Pt reports she is doing ok. Her lungs sound awful as usual. Just  used her inhaler prior to my arrival.  Pt reports she had a fall, tripped over the rug, landed on her rt side, her rt leg is  sore and tender. No wounds or bleeding.  She has gout flare in her finger, will see if pharmacy will send over the colchicine to PCP for refill.   Reminded her of her lab appoint next week.  Her nortriptyline is being decreased to '50mg'$  daily to wean off.   Refills needed-   Amiodarone Colchicine-needs refills  Folic acid Hydralazine-needs refills sent Losartan   Had to send message to PCP for colchicine and HF clinic for the hydralazine since the doctors listed are different than the ones she sees now.   She reports tenderness to the rt leg-its the same leg that has recently had veins opened up-it does not appear bruised., her knee appears swollen and that is tender but she has good range of motion and able to walk without issues.  Advised her to monitor that and if it worsens then to contact doctor or go to ER.  Meds verified and pill box refilled. Will come back out next week.  She has forgotten to use the cardiomems past few days. But will try to remember to do it today.  She is out of losartan after tomor night time dose. I left a sticker on the spots where it needs to go, and advised her she will have to place it where its needed.    Marylouise Stacks, [facility] Paramedic  07/22/21

## 2021-07-22 NOTE — Telephone Encounter (Signed)
Done

## 2021-07-23 ENCOUNTER — Other Ambulatory Visit (HOSPITAL_COMMUNITY): Payer: Self-pay | Admitting: *Deleted

## 2021-07-23 MED ORDER — HYDRALAZINE HCL 25 MG PO TABS: 25.0000 mg | ORAL_TABLET | Freq: Three times a day (TID) | ORAL | 11 refills | Status: DC

## 2021-07-27 ENCOUNTER — Encounter (HOSPITAL_BASED_OUTPATIENT_CLINIC_OR_DEPARTMENT_OTHER): Payer: Medicare Other | Admitting: Internal Medicine

## 2021-07-27 ENCOUNTER — Ambulatory Visit (HOSPITAL_COMMUNITY)
Admission: RE | Admit: 2021-07-27 | Discharge: 2021-07-27 | Disposition: A | Discharge status: Home / Self Care | Payer: Medicare Other | Source: Ambulatory Visit | Attending: Internal Medicine | Admitting: Internal Medicine

## 2021-07-27 ENCOUNTER — Other Ambulatory Visit: Payer: Self-pay

## 2021-07-27 DIAGNOSIS — I5042 Chronic combined systolic (congestive) and diastolic (congestive) heart failure: Secondary | ICD-10-CM | POA: Diagnosis not present

## 2021-07-27 DIAGNOSIS — S91301D Unspecified open wound, right foot, subsequent encounter: Secondary | ICD-10-CM | POA: Diagnosis not present

## 2021-07-27 LAB — BASIC METABOLIC PANEL
Anion gap: 11 (ref 5–15)
BUN: 21 mg/dL — ABNORMAL HIGH (ref 6–20)
CO2: 30 mmol/L (ref 22–32)
Calcium: 9.1 mg/dL (ref 8.9–10.3)
Chloride: 98 mmol/L (ref 98–111)
Creatinine, Ser: 1.37 mg/dL — ABNORMAL HIGH (ref 0.44–1.00)
GFR, Estimated: 45 mL/min — ABNORMAL LOW (ref >60)
Glucose, Bld: 192 mg/dL — ABNORMAL HIGH (ref 70–99)
Potassium: 4 mmol/L (ref 3.5–5.1)
Sodium: 139 mmol/L (ref 135–145)

## 2021-07-28 NOTE — Progress Notes (Signed)
DIAHN, LOHAN ([id number]) Visit Report for 07/27/2021 Arrival Information Details Patient Name: Date of Service: [name] 07/27/2021 2:00 PM Medical Record Number: [id number] Patient Account Number: [id number] Date of Birth/Sex: Treating RN: 26-Mar-1961 (60 y.o. [name] Primary Care Aubrea Meixner: [place] Other Clinician: Referring Latif Nazareno: Treating Niquan Charnley/Extender: Johnn Hai, Jairo Ben in Treatment: 22 Visit Information History Since Last Visit Added or deleted any medications: No Patient Arrived: Gilford Rile Any new allergies or adverse reactions: No Arrival Time: 14:16 Had a fall or experienced change in No Accompanied By: self activities of daily living that may affect Transfer Assistance: None risk of falls: Patient Identification Verified: Yes Signs or symptoms of abuse/neglect since last visito No Secondary Verification Process Completed: Yes Hospitalized since last visit: No Patient Requires Transmission-Based Precautions: No Implantable device outside of the clinic excluding No Patient Has Alerts: No cellular tissue based products placed in the center since last visit: Has Dressing in Place as Prescribed: Yes Pain Present Now: No Electronic Signature(s) Signed: 07/28/2021 8:38:46 AM By: Sandre Kitty Entered By: Sandre Kitty on 07/27/2021 14:18:41 -------------------------------------------------------------------------------- Encounter Discharge Information Details Patient Name: Date of Service: [name], [name] 07/27/2021 2:00 PM Medical Record Number: [id number] Patient Account Number: [id number] Date of Birth/Sex: Treating RN: April 29, 1961 (60 y.o. [name] Primary Care Chalise Pe: [place] Other Clinician: Referring Quintana Canelo: Treating Dodd Schmid/Extender: Johnn Hai, Jairo Ben in Treatment: 22 Encounter Discharge Information Items Post Procedure Vitals Discharge Condition:  Stable Temperature (F): 97.4 Ambulatory Status: Ambulatory Pulse (bpm): 74 Discharge Destination: Home Respiratory Rate (breaths/min): 17 Transportation: Private Auto Blood Pressure (mmHg): 147/74 Accompanied By: self Schedule Follow-up Appointment: Yes Clinical Summary of Care: Patient Declined Electronic Signature(s) Signed: 07/27/2021 5:15:03 PM By: [name] RN Entered By: [name] on 07/27/2021 15:20:16 -------------------------------------------------------------------------------- Lower Extremity Assessment Details Patient Name: Date of Service: [name], [name] 07/27/2021 2:00 PM Medical Record Number: [id number] Patient Account Number: [id number] Date of Birth/Sex: Treating RN: 30-Sep-1961 (60 y.o. [name] Primary Care Harlan Ervine: [place] Other Clinician: Referring Melany Wiesman: Treating Aviya Jarvie/Extender: Johnn Hai, [name] in Treatment: 22 Edema Assessment Assessed: [Left: Yes] [Right: Yes] Edema: [Left: Yes] [Right: Yes] Calf Left: Right: Point of Measurement: From Medial Instep 44 cm 45 cm Ankle Left: Right: Point of Measurement: From Medial Instep 22.8 cm 22.5 cm Vascular Assessment Pulses: Dorsalis Pedis Palpable: [Left:Yes] [Right:Yes] Posterior Tibial Palpable: [Left:Yes] [Right:Yes] Electronic Signature(s) Signed: 07/27/2021 5:15:03 PM By: [name] RN Entered By: [name] on 07/27/2021 14:47:39 -------------------------------------------------------------------------------- Multi Wound Chart Details Patient Name: Date of Service: [name], [name] 07/27/2021 2:00 PM Medical Record Number: [id number] Patient Account Number: [id number] Date of Birth/Sex: Treating RN: [birth year] (60 y.o. [name] Primary Care Jessikah Dicker: [place] Other Clinician: Referring Kioni Stahl: Treating Tali Coster/Extender: Johnn Hai, [name] in Treatment: 22 Vital  Signs Height(in): 64 Capillary Blood Glucose(mg/dl): 246 Weight(lbs): 222 Pulse(bpm): 27 Body Mass Index(BMI): 53 Blood Pressure(mmHg): 130/73 Temperature(F): 98.7 Respiratory Rate(breaths/min): 22 Photos: [16:Right, Plantar Amputation Site -] [21:Left Forearm] [N/A:N/A N/A] Wound Location: [16:Transmetatarsal Gradually Appeared] [21:Skin Tear/Laceration] [N/A:N/A] Wounding Event: [16:Diabetic Wound/Ulcer of the Lower] [21:Trauma, Other] [N/A:N/A] Primary Etiology: [16:Extremity Chronic Obstructive Pulmonary] [21:Chronic Obstructive Pulmonary] [N/A:N/A] Comorbid History: [16:Disease (COPD), Sleep Apnea, Arrhythmia, Congestive Heart Failure,Arrhythmia, Congestive Heart Failure, Coronary Artery Disease, Hypertension, Peripheral Arterial Disease, Cirrhosis , Type II Diabetes, Osteoarthritis, Neuropathy  02/05/2021] [21:Disease (COPD), Sleep Apnea, Coronary Artery Disease, Hypertension, Peripheral Arterial Osteoarthritis, Neuropathy 07/20/2021] [N/A:N/A] Date Acquired: [16:22] [21:1] [N/A:N/A]  Weeks of Treatment: [16:Open] [21:Healed - Epithelialized] [N/A:N/A] Wound Status: [16:1x1x0.6] [21:0x0x0] [N/A:N/A] Measurements L x W x D (cm) [16:0.785] [21:0] [N/A:N/A] A (cm) : rea [16:0.471] [21:0] [N/A:N/A] Volume (cm) : [16:52.00%] [21:100.00%] [N/A:N/A] % Reduction in Area: [16:3.90%] [21:100.00%] [N/A:N/A] % Reduction in Volume: [16:Grade 2] [21:Full Thickness Without Exposed] [N/A:N/A] Classification: [16:Medium] [21:Support Structures Medium] [N/A:N/A] Exudate Amount: [16:Serous] [21:Serosanguineous] [N/A:N/A] Exudate Type: [16:amber] [21:red, brown] [N/A:N/A] Exudate Color: [16:Thickened] [21:Flat and Intact] [N/A:N/A] Wound Margin: [16:Large (67-100%)] [21:Large (67-100%)] [N/A:N/A] Granulation Amount: [16:Red] [21:Red, Pink] [N/A:N/A] Granulation Quality: [16:Small (1-33%)] [21:None Present (0%)] [N/A:N/A] Necrotic Amount: [16:Fat Layer (Subcutaneous Tissue): Yes Fat Layer  (Subcutaneous Tissue): Yes N/A] Exposed Structures: [16:Fascia: No Tendon: No Muscle: No Joint: No Bone: No None] [21:Fascia: No Tendon: No Muscle: No Joint: No Bone: No None] [N/A:N/A] Treatment Notes Electronic Signature(s) Signed: 07/27/2021 5:15:03 PM By: [name] RN Signed: 07/28/2021 3:40:47 PM By: Linton Ham MD Entered By: Linton Ham on 07/27/2021 15:14:44 -------------------------------------------------------------------------------- Multi-Disciplinary Care Plan Details Patient Name: Date of Service: [name], [name] 07/27/2021 2:00 PM Medical Record Number: [id number] Patient Account Number: [id number] Date of Birth/Sex: Treating RN: [birth year] (60 y.o. [name] Primary Care Derell Bruun: [place] Other Clinician: Referring Merrilyn Legler: Treating Concepcion Gillott/Extender: Johnn Hai, [name] in Treatment: 22 Active Inactive Wound/Skin Impairment Nursing Diagnoses: Impaired tissue integrity Goals: Patient/caregiver will verbalize understanding of skin care regimen Date Initiated: 02/19/2021 Target Resolution Date: 08/13/2021 Goal Status: Active Ulcer/skin breakdown will have a volume reduction of 30% by week 4 Date Initiated: 02/19/2021 Date Inactivated: 03/29/2021 Target Resolution Date: 04/09/2021 Goal Status: Unmet Unmet Reason: PAD Interventions: Assess patient/caregiver ability to obtain necessary supplies Assess patient/caregiver ability to perform ulcer/skin care regimen upon admission and as needed Assess ulceration(s) every visit Provide education on ulcer and skin care Treatment Activities: Skin care regimen initiated : 02/19/2021 Topical wound management initiated : 02/19/2021 Notes: Electronic Signature(s) Signed: 07/27/2021 5:15:03 PM By: [name] RN Entered By: [name] on 07/27/2021 14:55:51 -------------------------------------------------------------------------------- Pain Assessment  Details Patient Name: Date of Service: [name], [name] 07/27/2021 2:00 PM Medical Record Number: [id number] Patient Account Number: [id number] Date of Birth/Sex: Treating RN: March 12, 1961 (60 y.o. [name] Primary Care Adamari Frede: [place] Other Clinician: Referring Baylon Santelli: Treating Georgina Krist/Extender: Johnn Hai, [name] in Treatment: 22 Active Problems Location of Pain Severity and Description of Pain Patient Has Paino No Site Locations Pain Management and Medication Current Pain Management: Electronic Signature(s) Signed: 07/27/2021 5:15:03 PM By: [name] RN Signed: 07/28/2021 8:38:46 AM By: Sandre Kitty Entered By: Sandre Kitty on 07/27/2021 14:19:52 -------------------------------------------------------------------------------- Patient/Caregiver Education Details Patient Name: Date of Service: [name]. 9/13/2022andnbsp2:00 PM Medical Record Number: [id number] Patient Account Number: [id number] Date of Birth/Gender: Treating RN: Apr 08, 1961 (60 y.o. [name] Primary Care Physician: [place] Other Clinician: Referring Physician: Treating Physician/Extender: Johnn Hai, Jairo Ben in Treatment: 22 Education Assessment Education Provided To: Patient Education Topics Provided Wound/Skin Impairment: Methods: Explain/Verbal Responses: Reinforcements needed, State content correctly [employer]) Signed: 07/27/2021 5:15:03 PM By: [name] RN Entered By: [name] on 07/27/2021 14:56:38 -------------------------------------------------------------------------------- Wound Assessment Details Patient Name: Date of Service: [name], [name] 07/27/2021 2:00 PM Medical Record Number: [id number] Patient Account Number: [id number] Date of Birth/Sex: Treating RN: 1961/11/13 (60 y.o. [name] Primary Care Glorine Hanratty: [place] Other  Clinician: Referring Jacquel Redditt: Treating Ailee Pates/Extender: Johnn Hai, [name] in Treatment: 22 Wound Status Wound Number: 16 Primary Diabetic Wound/Ulcer of the  Lower Extremity Etiology: Wound Location: Right, Plantar Amputation Site - Transmetatarsal Wound Open Wounding Event: Gradually Appeared Status: Date Acquired: 02/05/2021 Comorbid Chronic Obstructive Pulmonary Disease (COPD), Sleep Apnea, Weeks Of Treatment: 22 History: Arrhythmia, Congestive Heart Failure, Coronary Artery Disease, Clustered Wound: No Hypertension, Peripheral Arterial Disease, Cirrhosis , Type II Diabetes, Osteoarthritis, Neuropathy Photos Wound Measurements Length: (cm) 1 Width: (cm) 1 Depth: (cm) 0.6 Area: (cm) 0.785 Volume: (cm) 0.471 % Reduction in Area: 52% % Reduction in Volume: 3.9% Epithelialization: None Wound Description Classification: Grade 2 Wound Margin: Thickened Exudate Amount: Medium Exudate Type: Serous Exudate Color: amber Foul Odor After Cleansing: No Slough/Fibrino Yes Wound Bed Granulation Amount: Large (67-100%) Exposed Structure Granulation Quality: Red Fascia Exposed: No Necrotic Amount: Small (1-33%) Fat Layer (Subcutaneous Tissue) Exposed: Yes Necrotic Quality: Adherent Slough Tendon Exposed: No Muscle Exposed: No Joint Exposed: No Bone Exposed: No Treatment Notes 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 Wound Cleanser Discharge Instruction: Cleanse the wound with wound cleanser or normal saline prior to applying a clean dressing using gauze sponges, not tissue or cotton balls. Peri-Wound Care Sween Lotion (Moisturizing lotion) Discharge Instruction: Apply moisturizing lotion as directed Topical Primary Dressing Hydrofera Blue Classic Foam, 2x2 in Discharge  Instruction: Moisten with saline prior to applying to wound bed Secondary Dressing Woven Gauze Sponge, Non-Sterile 4x4 in Discharge Instruction: Apply over primary dressing as directed. Optifoam Non-Adhesive Dressing, 4x4 in Discharge Instruction: Foam donut to help offload Secured With Compression Wrap Kerlix Roll 4.5x3.1 (in/yd) Discharge Instruction: Apply Kerlix and Coban compression as directed. Coban Self-Adherent Wrap 4x5 (in/yd) Discharge Instruction: Apply over Kerlix as directed. Compression Stockings Add-Ons Electronic Signature(s) Signed: 07/27/2021 4:19:19 PM By: Maye Hides Signed: 07/27/2021 5:15:03 PM By: [name] RN Entered By: Maye Hides on 07/27/2021 16:19:18 -------------------------------------------------------------------------------- Wound Assessment Details Patient Name: Date of Service: [name], [name] 07/27/2021 2:00 PM Medical Record Number: [id number] Patient Account Number: [id number] Date of Birth/Sex: Treating RN: 01/08/1961 (60 y.o. [name] Primary Care Edwin Cherian: [place] Other Clinician: Referring Maks Cavallero: Treating Brenn Gatton/Extender: Johnn Hai, [name] in Treatment: 22 Wound Status Wound Number: 21 Primary Trauma, Other Etiology: Wound Location: Left Forearm Wound Healed - Epithelialized Wounding Event: Skin Tear/Laceration Status: Date Acquired: 07/20/2021 Comorbid Chronic Obstructive Pulmonary Disease (COPD), Sleep Apnea, Weeks Of Treatment: 1 History: Arrhythmia, Congestive Heart Failure, Coronary Artery Disease, Clustered Wound: No Hypertension, Peripheral Arterial Disease, Cirrhosis , Type II Diabetes, Osteoarthritis, Neuropathy Photos Wound Measurements Length: (cm) Width: (cm) Depth: (cm) Area: (cm) Volume: (cm) 0 % Reduction in Area: 100% 0 % Reduction in Volume: 100% 0 Epithelialization: None 0 0 Wound Description Classification: Full Thickness Without Exposed  Support Structures Wound Margin: Flat and Intact Exudate Amount: Medium Exudate Type: Serosanguineous Exudate Color: red, brown Foul Odor After Cleansing: No Slough/Fibrino No Wound Bed Granulation Amount: Large (67-100%) Exposed Structure Granulation Quality: Red, Pink Fascia Exposed: No Necrotic Amount: None Present (0%) Fat Layer (Subcutaneous Tissue) Exposed: Yes Tendon Exposed: No Muscle Exposed: No Joint Exposed: No Bone Exposed: No Treatment Notes Wound #21 (Forearm) Wound Laterality: Left Cleanser Peri-Wound Care Topical Primary Dressing Secondary Dressing Secured With Compression Wrap Compression Stockings Add-Ons Electronic Signature(s) Signed: 07/27/2021 5:15:03 PM By: [name] RN Entered By: [name] on 07/27/2021 15:14:20 -------------------------------------------------------------------------------- Vitals Details Patient Name: Date of Service: HA LEY, [name] 07/27/2021 2:00 PM Medical Record Number:  [id number] Patient Account Number: [id number] Date of Birth/Sex: Treating RN: [birth year] (60 y.o. [name] Primary Care Leovanni Bjorkman: [place] Other Clinician: Referring Marlisha Vanwyk: Treating Arora Coakley/Extender: Johnn Hai, [name] in Treatment: 22 Vital Signs Time Taken: 14:18 Temperature (F): 98.7 Height (in): 64 Pulse (bpm): 90 Weight (lbs): 222 Respiratory Rate (breaths/min): 22 Body Mass Index (BMI): 38.1 Blood Pressure (mmHg): 130/73 Capillary Blood Glucose (mg/dl): 246 Reference Range: 80 - 120 mg / dl Electronic Signature(s) Signed: 07/28/2021 8:38:46 AM By: Sandre Kitty Entered By: Sandre Kitty on 07/27/2021 14:19:39

## 2021-07-28 NOTE — Progress Notes (Signed)
KADIJAH, SHAMOON ([id number]) Visit Report for 07/27/2021 Debridement Details Patient Name: Date of Service: [name] 07/27/2021 2:00 PM Medical Record Number: [id number] Patient Account Number: [id number] Date of Birth/Sex: Treating RN: 19-Sep-1961 (60 y.o. [name] Primary Care Provider: [place] [place] Other Clinician: Referring Provider: Treating Provider/Extender: Johnn Hai, Jairo Ben in Treatment: 22 Debridement Performed for Assessment: Wound #16 Right,Plantar Amputation Site - Transmetatarsal Performed By: Physician Ricard Dillon., MD Debridement Type: Debridement Severity of Tissue Pre Debridement: Fat layer exposed Level of Consciousness (Pre-procedure): Awake and Alert Pre-procedure Verification/Time Out Yes - 15:17 Taken: Start Time: 15:17 Pain Control: Lidocaine T Area Debrided (L x W): otal 1 (cm) x 1 (cm) = 1 (cm) Tissue and other material debrided: Viable, Non-Viable, Slough, Subcutaneous, Skin: Dermis , Skin: Epidermis, Slough Level: Skin/Subcutaneous Tissue Debridement Description: Excisional Instrument: Curette Bleeding: Minimum Hemostasis Achieved: Pressure End Time: 15:17 Procedural Pain: 0 Post Procedural Pain: 0 Response to Treatment: Procedure was tolerated well Level of Consciousness (Post- Awake and Alert procedure): Post Debridement Measurements of Total Wound Length: (cm) 1 Width: (cm) 1 Depth: (cm) 0.6 Volume: (cm) 0.471 Character of Wound/Ulcer Post Debridement: Improved Severity of Tissue Post Debridement: Fat layer exposed Post Procedure Diagnosis Same as Pre-procedure Electronic Signature(s) Signed: 07/27/2021 5:15:03 PM By: [name] RN Signed: 07/28/2021 3:40:47 PM By: Linton Ham MD Entered By: Linton Ham on 07/27/2021 15:29:14 -------------------------------------------------------------------------------- HPI Details Patient Name: Date of Service: [name], [name] 07/27/2021  2:00 PM Medical Record Number: [id number] Patient Account Number: [id number] Date of Birth/Sex: Treating RN: 09-Mar-1961 (60 y.o. [name] Primary Care Provider: [place] Other Clinician: Referring Provider: Treating Provider/Extender: Johnn Hai, Romelle Starcher Weeks in Treatment: 22 History of Present Illness HPI Description: This 60 year old patient who has a very long significant history of diabetes mellitus, previous alcohol and nicotine abuse, chronic data disease, COPD, diabetes mellitus, height hypertension, critical lower limb ischemia with several wounds being managed at the wound Center at [facility] for over a year. She was recently in the ER at [facility] and was referred to our center. He has had a long history of critical limb ischemia and over a period of time has had balloon angioplasties in March 2016, endarterectomy of the left carotid, lower extremity angiogram and treatment by Dr. Quay Burow, several cardiac catheterizations. Most recently she had an x-ray of her left foot while in the ER which showed no acute abnormality. during this ER visit she was started on ciprofloxacin and asked to continue with the wound care physicians at [facility]. Her last ABI done in June 2017 showed the right side to be 0.28 in the left side to be 0.48. Her right toe brachial index was 0.17 on the right and 0.27 on the left. her last hemoglobin A1c was 11.1% She continues to smoke about a pack of cigarettes a day. 03/28/2017 -- -- right foot x-ray -- IMPRESSION:Areas of soft tissue swelling. Mild subluxation second PIP joint. No frank dislocation or fracture. No erosive change or bony destruction. No soft tissue ulceration or radiopaque foreign body evident by radiography. There is plantar fascia calcification with a nearby inferior calcaneal spur. There are foci of arterial vascular calcification. 04/04/2017 -- the patient continues to be noncompliant and  continues to complain of a lot of pain and has not done anything about quitting smoking Readmission: 06/26/18 on evaluation today patient presents for reevaluation she has not been seen in our office since May 2018.  Since she was last seen here in our office she has undergone testing at Dr. Elspeth Cho office where it was revealed that she had an ABI of 0.68 with her previous ABI being 0.64 and a left ABI of 0.38 with previous ABI being 0.34 this study which was performed on 04/05/18 was pretty much equivalent to the study performed on 11/22/17. The findings in the end revealed on the right would appear to be moderate right lower extremity arterial disease on the left Dr. Gwenlyn Found states moderate in the report but unfortunately this appears to be much more severe at 0.38 compared to the right. Subsequently the patient was scheduled to have angiography with Dr. Gwenlyn Found on 04/12/18. This was however canceled due to the fact that the patient was found to have chronic kidney disease stage III and it was to the point that he did not feel that it will be safe to pursue angiography at that point. She has not been on any antibiotics recently. At this point Dr. Gwenlyn Found has not rescheduled anything as far as the injured [place] according to the patient he is somewhat reluctant to do so. Nonetheless with her diminished blood flow this is gonna make it somewhat difficult for her to heal. 07/05/18; this is a patient I have not seen previously. She has very significant PAD as noted above. She apparently has had revascularization efforts by Dr. Gwenlyn Found on the right on 3 occasions to the patient. She was supposed to have an attempted angiography on the left however this was canceled apparently because of stage III chronic renal failure. I will need to research all of this. She complains of significant pain in the wound and has claudication enough that when she walks to the end of the driveway she has to stop. She is been using  silver alginate to the wounds on her legs and Iodoflex to the area on her left second toe. 07/13/18 on evaluation today patient's wounds actually appear to be doing about the same. She has an appointment she tells me within the next month that is September 2019 with Dr. Gwenlyn Found to discuss options to see if there's anything else that you can recommend or do for her. Nonetheless obviously what we're trying to do is do what we can to save her leg and in turn prevent any additional worsening or damage. None in the meantime we been mainly trying to manage her ulcers as best we can. 07/27/18; some improvement in the multiplicity of wounds on her left lower extremity and foot. She's been using silver alginate. I was unable to determine that she actually has an appointment with Dr. Gwenlyn Found. We are checking into this The patient's wound includes Left lateral foot, left plantar heel, her left anterior calf wound looks close to me, left fourth toe is very close to closed and the left medial calf is perhaps the largest open area READMISSION 02/19/2021 This is a now 61 year old woman with type 2 diabetes and continued cigarette smoking. She has known PAD. She was last in this clinic in 2019 at that point with wounds on her left foot. She left in a nonhealed state. On 06/28/2019 I see she had a left femoropopliteal by vein and vascular with an amputation of the fifth ray. These managed to heal over. Her last arterial studies were in February 2021. This showed an absent waveform at the posterior tibial artery on the right a dampened monophasic dorsalis pedis on the right of 0.44. On the left again the  PTA TA was absent her dorsalis pedis was 0.99 triphasic and her great toe pressure was 0.65. This would have been after her revascularization. Once again she tells me that Dr. Gwenlyn Found has done revascularization on the right leg on 3 different occasions. She has a right transmetatarsal amputation apparently done by Dr. Sharol Given  remotely but I do not see a note for these right lower extremity revascularization but I may not of looked back far enough. In any case she says that the she has a wound on the plantar aspect of the right transmetatarsal amputation site there is been there for several months. More recently she fell and had a wound on her right medial lower leg she had 5 sutures placed in the ER and then subsequently she has developed an area on the medial lower leg which was a blister that opened up. Past medical history includes type 2 diabetes, PAD, chronic renal failure, congestive heart failure, right transmetatarsal amputation, left fifth ray amputation, continued tobacco abuse, atrial fibrillation and cirrhosis. We did not attempt an ABI on the right leg today because of pain 02/26/2021; patient we admitted to the clinic last week. She has what looks to be 2 areas on her right medial and right anterior lower leg that look more like venous wounds but she has 1 on the first met head at the base of her right transmet. She has known severe PAD. She complains of a lot of pain although some of this may be neuropathic. Is difficult to exclude a component of claudication. Use silver alginate on the wounds on her legs and Iodoflex on the area on the first met head. She has an appointment with Dr. Gwenlyn Found on 5/25 although I will text him and discussed the situation. She is have poorly controlled diabetic. She has has stage IIIb chronic renal failure 4/22; patient presents for 1 week follow-up. The 2 areas on her right medial and right anterior lower leg appear well-healing. She has been using silver alginate to this area without issues. She has a first met head ulcer and it is unclear how long this has been there as it was discovered in the ED earlier this month when she was being evaluated for another issue. Iodoflex has been used at this area. 4/29; patient presents for 1 week follow-up. She has been using silver  alginate to the leg wounds and Iodoflex to the plantar foot wound. She has had this wrapped with Coban and Kerlix. She has no concerns or complaints today. 5/6; this is a difficult wound on the right plantar foot transmit site. We are not making a lot of progress. She had 2 more venous looking wounds on the right medial and right anterior lower leg 1 of which is healed. Her appointment with Dr. Gwenlyn Found is on 5/25 5/16; she did not tolerate the offloading shoe we gave her in fact she had a fall with an abrasion on her right forearm she is now back in the modified small shoe which does not offload her foot properly. Her appoint with Dr. Gwenlyn Found is still on 5/25 we have been using Iodoflex. The area on the right anterior lower leg is healed 7/18; patient presents for follow-up however has not been seen in 2 months. She was last seen at the end of May. She had a fall at the end of June and was hospitalized. She has been unable to follow-up since then. For the wound she has only been keeping it covered with gauze. She  is scheduled for an aortogram with lower extremity runoff at the end of July. 7/29; patient presents for 2-week follow-up. She has home health and they have been changing her dressings. She denies any issues and has no complaints today. She states she had a procedure where they opened up one of her vessels in her legs. She denies signs of infection. 8/5; patient presents for follow-up. She was recently in the hospital for bradycardia. She was noted to have cellulitis to the left lower extremity and Unna boot was placed due to blisters and increased swelling. She reports improvement to her left leg since being in the hospital and stability check her right plantar foot wound. She denies signs of infection. 8/23; since I last saw this patient a lot has happened. She still has the area over the right first met head in the setting of previous transmetatarsal amputation. Firstly most importantly she  underwent revascularization of her occluded right SFA. She underwent directional atherectomy followed by drug-coated balloon angioplasty. She has no named vessel below the knee hopefully the revascularization of the right SFA will improve collateral flow. It is not felt however that she has any endovascular options. She had follow-up arterial studies noninvasive on 8/9. These showed an ABI of 0.44 at the right PTA. Monophasic waveforms. On the left her great toe ABI was 0.68. Her follow-up arterial Doppler showed a 50 to 74% stenosis in the proximal SFA and a 50 to 74% stenosis in the distal SFA mild to severe atherosclerosis noted throughout the extremity. Areas of shadowing plaque seen, unable to rule out higher grade stenosis she also had a fall apparently wearing a right foot forefoot off loader that we gave her. She therefore comes in in an ordinary running shoe today. Not certain if this is the fall that ended up in the hospital with bradycardia 9/6 area over the right first metatarsal head in the setting of her previous amputation and severe PAD. She is also a diabetic with known PAD status post attempt at revascularization. She is continued smoker Again the separation of visits in the clinic is somewhat disturbing if we are going to consider her for a total contact cast. She is not wearing anything to offload this stating it causes imbalance especially the forefoot off loader. She basically comes in in bedroom slippers She also had a fall this morning about an hour ago. She has a skin tear on the left dorsal forearm 9/13; areas over the right first metatarsal head in the setting of previous TMA and severe PAD. Again she comes in here in slippers. We have been using Hydrofera Blue. We use MolecuLight on this which was essentially negative study Electronic Signature(s) Signed: 07/28/2021 3:40:47 PM By: Linton Ham MD Entered By: Linton Ham on 07/27/2021  15:31:50 -------------------------------------------------------------------------------- Physical Exam Details Patient Name: Date of Service: [name], [name] 07/27/2021 2:00 PM Medical Record Number: [id number] Patient Account Number: [id number] Date of Birth/Sex: Treating RN: June 17, 1961 (60 y.o. [name] Primary Care Provider: [place] Other Clinician: Referring Provider: Treating Provider/Extender: Johnn Hai, [name] in Treatment: 22 Constitutional Sitting or standing Blood Pressure is within target range for patient.. Pulse regular and within target range for patient.[name] Respirations regular, non-labored and within target range.. Temperature is normal and within the target range for the patient.[name] Appears in no distress. Notes Wound exam; right foot first metatarsal head. Callus around this wound was better this week after last week's debridement. The surface is gritty and  fibrinous I used a #3 curette to remove this. There is 0.9 cm of undermining from roughly 10-1 o'clock if this does not go to bone. No surrounding erythema Electronic Signature(s) Signed: 07/28/2021 3:40:47 PM By: Linton Ham MD Entered By: Linton Ham on 07/27/2021 15:33:04 -------------------------------------------------------------------------------- Physician Orders Details Patient Name: Date of Service: [name], [name] 07/27/2021 2:00 PM Medical Record Number: [id number] Patient Account Number: [id number] Date of Birth/Sex: Treating RN: 05/28/1961 (60 y.o. [name] Primary Care Provider: [place] Other Clinician: Referring Provider: Treating Provider/Extender: Johnn Hai, Jairo Ben in Treatment: 22 Verbal / Phone Orders: No Diagnosis Coding ICD-10 Coding Code Description I96.789F Unspecified open wound, right foot, subsequent encounter 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 2 diabetes mellitus with foot ulcer E11.51 Type 2 diabetes mellitus with  diabetic peripheral angiopathy without gangrene I87.2 Venous insufficiency (chronic) (peripheral) Follow-up Appointments ppointment in 1 week. - with Dr. Dellia Nims Return A Bathing/ Shower/ Hygiene May shower with protection but do not get wound dressing(s) wet. Edema Control - Lymphedema / SCD / Other Elevate legs to the level of the heart or above for 30 minutes daily and/or when sitting, a frequency of: - throughout the day Avoid standing for long periods of time. Patient to wear own compression stockings every day. - to left leg now Exercise regularly Off-Loading Open toe surgical shoe to: - with felt padding to offload Additional Orders / Instructions Stop/Decrease Smoking Follow Nutritious Diet Wound Treatment Wound #16 - Amputation Site - Transmetatarsal Wound Laterality: Plantar, Right Cleanser: Soap and Water (Home Health) 1 x Per Week/7 Days Discharge Instructions: May shower and wash wound with dial antibacterial soap and water prior to dressing change. Cleanser: Wound Cleanser (Home Health) 1 x Per Week/7 Days Discharge Instructions: Cleanse the wound with wound cleanser or normal saline prior to applying a clean dressing using gauze sponges, not tissue or cotton balls. Peri-Wound Care: Sween Lotion (Moisturizing lotion) (Home Health) 1 x Per Week/7 Days Discharge Instructions: Apply moisturizing lotion as directed Prim Dressing: Hydrofera Blue Classic Foam, 2x2 in (Home Health) 1 x Per Week/7 Days ary Discharge Instructions: Moisten with saline prior to applying to wound bed Secondary Dressing: Woven Gauze Sponge, Non-Sterile 4x4 in (Home Health) 1 x Per Week/7 Days Discharge Instructions: Apply over primary dressing as directed. Secondary Dressing: Optifoam Non-Adhesive Dressing, 4x4 in (Home Health) 1 x Per Week/7 Days Discharge Instructions: Foam donut to help offload Compression Wrap: Kerlix Roll 4.5x3.1 (in/yd) (Home Health) 1 x Per Week/7 Days Discharge Instructions:  Apply Kerlix and Coban compression as directed. Compression Wrap: Coban Self-Adherent Wrap 4x5 (in/yd) (Home Health) 1 x Per Week/7 Days Discharge Instructions: Apply over Kerlix as directed. Electronic Signature(s) Signed: 07/27/2021 5:15:03 PM By: [name] RN Signed: 07/28/2021 3:40:47 PM By: Linton Ham MD Entered By: [name] on 07/27/2021 15:14:44 -------------------------------------------------------------------------------- Problem List Details Patient Name: Date of Service: [name], [name] 07/27/2021 2:00 PM Medical Record Number: [id number] Patient Account Number: [id number] Date of Birth/Sex: Treating RN: 31-Jan-1961 (60 y.o. [name] Primary Care Provider: [place] Other Clinician: Referring Provider: Treating Provider/Extender: Johnn Hai, [name] in Treatment: 22 Active Problems ICD-10 Encounter Code Description Active Date MDM Diagnosis S91.301D Unspecified open wound, right foot, subsequent encounter 05/31/2021 No Yes 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 2 diabetes mellitus with foot ulcer 02/19/2021 No Yes E11.51 Type 2 diabetes mellitus with diabetic peripheral angiopathy without gangrene 02/19/2021 No Yes I87.2 Venous insufficiency (chronic) (peripheral) 06/18/2021 No Yes Inactive Problems ICD-10 Code Description Active Date Inactive Date  L97.811 Non-pressure chronic ulcer of other part of right lower leg limited to breakdown of skin 02/19/2021 02/19/2021 S81.802A Unspecified open wound, left lower leg, initial encounter 06/18/2021 06/18/2021 S40.812D Abrasion of left upper arm, subsequent encounter 07/20/2021 07/20/2021 Resolved Problems ICD-10 Code Description Active Date Resolved Date S40.811D Abrasion of right upper arm, subsequent encounter 03/29/2021 03/29/2021 Electronic Signature(s) Signed: 07/28/2021 3:40:47 PM By: Linton Ham MD Entered By: Linton Ham on 07/27/2021  15:14:27 -------------------------------------------------------------------------------- Progress Note Details Patient Name: Date of Service: [name], [name] 07/27/2021 2:00 PM Medical Record Number: [id number] Patient Account Number: [id number] Date of Birth/Sex: Treating RN: 04/15/1961 (60 y.o. [name] Primary Care Provider: [place] Other Clinician: Referring Provider: Treating Provider/Extender: Johnn Hai, [name] in Treatment: 22 Subjective History of Present Illness (HPI) This 60 year old patient who has a very long significant history of diabetes mellitus, previous alcohol and nicotine abuse, chronic data disease, COPD, diabetes mellitus, height hypertension, critical lower limb ischemia with several wounds being managed at the wound Center at [facility] for over a year. She was recently in the ER at [facility] and was referred to our center. He has had a long history of critical limb ischemia and over a period of time has had balloon angioplasties in March 2016, endarterectomy of the left carotid, lower extremity angiogram and treatment by Dr. Quay Burow, several cardiac catheterizations. Most recently she had an x-ray of her left foot while in the ER which showed no acute abnormality. during this ER visit she was started on ciprofloxacin and asked to continue with the wound care physicians at [facility]. Her last ABI done in June 2017 showed the right side to be 0.28 in the left side to be 0.48. Her right toe brachial index was 0.17 on the right and 0.27 on the left. her last hemoglobin A1c was 11.1% She continues to smoke about a pack of cigarettes a day. 03/28/2017 -- -- right foot x-ray -- IMPRESSION:Areas of soft tissue swelling. Mild subluxation second PIP joint. No frank dislocation or fracture. No erosive change or bony destruction. No soft tissue ulceration or radiopaque foreign body evident by radiography. There is  plantar fascia calcification with a nearby inferior calcaneal spur. There are foci of arterial vascular calcification. 04/04/2017 -- the patient continues to be noncompliant and continues to complain of a lot of pain and has not done anything about quitting smoking Readmission: 06/26/18 on evaluation today patient presents for reevaluation she has not been seen in our office since May 2018. Since she was last seen here in our office she has undergone testing at Dr. Fransico Klaira Pesci office where it was revealed that she had an ABI of 0.68 with her previous ABI being 0.64 and a left ABI of 0.38 with previous ABI being 0.34 this study which was performed on 04/05/18 was pretty much equivalent to the study performed on 11/22/17. The findings in the end revealed on the right would appear to be moderate right lower extremity arterial disease on the left Dr. Gwenlyn Found states moderate in the report but unfortunately this appears to be much more severe at 0.38 compared to the right. Subsequently the patient was scheduled to have angiography with Dr. Gwenlyn Found on 04/12/18. This was however canceled due to the fact that the patient was found to have chronic kidney disease stage III and it was to the point that he did not feel that it will be safe to pursue angiography at that point. She has not  been on any antibiotics recently. At this point Dr. Gwenlyn Found has not rescheduled anything as far as the injured [place] according to the patient he is somewhat reluctant to do so. Nonetheless with her diminished blood flow this is gonna make it somewhat difficult for her to heal. 07/05/18; this is a patient I have not seen previously. She has very significant PAD as noted above. She apparently has had revascularization efforts by Dr. Gwenlyn Found on the right on 3 occasions to the patient. She was supposed to have an attempted angiography on the left however this was canceled apparently because of stage III chronic renal failure. I will need to  research all of this. She complains of significant pain in the wound and has claudication enough that when she walks to the end of the driveway she has to stop. She is been using silver alginate to the wounds on her legs and Iodoflex to the area on her left second toe. 07/13/18 on evaluation today patient's wounds actually appear to be doing about the same. She has an appointment she tells me within the next month that is September 2019 with Dr. Gwenlyn Found to discuss options to see if there's anything else that you can recommend or do for her. Nonetheless obviously what we're trying to do is do what we can to save her leg and in turn prevent any additional worsening or damage. None in the meantime we been mainly trying to manage her ulcers as best we can. 07/27/18; some improvement in the multiplicity of wounds on her left lower extremity and foot. She's been using silver alginate. I was unable to determine that she actually has an appointment with Dr. Gwenlyn Found. We are checking into this The patient's wound includes Left lateral foot, left plantar heel, her left anterior calf wound looks close to me, left fourth toe is very close to closed and the left medial calf is perhaps the largest open area READMISSION 02/19/2021 This is a now 60 year old woman with type 2 diabetes and continued cigarette smoking. She has known PAD. She was last in this clinic in 2019 at that point with wounds on her left foot. She left in a nonhealed state. On 06/28/2019 I see she had a left femoropopliteal by vein and vascular with an amputation of the fifth ray. These managed to heal over. Her last arterial studies were in February 2021. This showed an absent waveform at the posterior tibial artery on the right a dampened monophasic dorsalis pedis on the right of 0.44. On the left again the PTA TA was absent her dorsalis pedis was 0.99 triphasic and her great toe pressure was 0.65. This would have been after her revascularization. Once  again she tells me that Dr. Gwenlyn Found has done revascularization on the right leg on 3 different occasions. She has a right transmetatarsal amputation apparently done by Dr. Sharol Given remotely but I do not see a note for these right lower extremity revascularization but I may not of looked back far enough. In any case she says that the she has a wound on the plantar aspect of the right transmetatarsal amputation site there is been there for several months. More recently she fell and had a wound on her right medial lower leg she had 5 sutures placed in the ER and then subsequently she has developed an area on the medial lower leg which was a blister that opened up. Past medical history includes type 2 diabetes, PAD, chronic renal failure, congestive heart failure, right transmetatarsal amputation,  left fifth ray amputation, continued tobacco abuse, atrial fibrillation and cirrhosis. We did not attempt an ABI on the right leg today because of pain 02/26/2021; patient we admitted to the clinic last week. She has what looks to be 2 areas on her right medial and right anterior lower leg that look more like venous wounds but she has 1 on the first met head at the base of her right transmet. She has known severe PAD. She complains of a lot of pain although some of this may be neuropathic. Is difficult to exclude a component of claudication. Use silver alginate on the wounds on her legs and Iodoflex on the area on the first met head. She has an appointment with Dr. Gwenlyn Found on 5/25 although I will text him and discussed the situation. She is have poorly controlled diabetic. She has has stage IIIb chronic renal failure 4/22; patient presents for 1 week follow-up. The 2 areas on her right medial and right anterior lower leg appear well-healing. She has been using silver alginate to this area without issues. She has a first met head ulcer and it is unclear how long this has been there as it was discovered in the ED earlier  this month when she was being evaluated for another issue. Iodoflex has been used at this area. 4/29; patient presents for 1 week follow-up. She has been using silver alginate to the leg wounds and Iodoflex to the plantar foot wound. She has had this wrapped with Coban and Kerlix. She has no concerns or complaints today. 5/6; this is a difficult wound on the right plantar foot transmit site. We are not making a lot of progress. She had 2 more venous looking wounds on the right medial and right anterior lower leg 1 of which is healed. Her appointment with Dr. Gwenlyn Found is on 5/25 5/16; she did not tolerate the offloading shoe we gave her in fact she had a fall with an abrasion on her right forearm she is now back in the modified small shoe which does not offload her foot properly. Her appoint with Dr. Gwenlyn Found is still on 5/25 we have been using Iodoflex. The area on the right anterior lower leg is healed 7/18; patient presents for follow-up however has not been seen in 2 months. She was last seen at the end of May. She had a fall at the end of June and was hospitalized. She has been unable to follow-up since then. For the wound she has only been keeping it covered with gauze. She is scheduled for an aortogram with lower extremity runoff at the end of July. 7/29; patient presents for 2-week follow-up. She has home health and they have been changing her dressings. She denies any issues and has no complaints today. She states she had a procedure where they opened up one of her vessels in her legs. She denies signs of infection. 8/5; patient presents for follow-up. She was recently in the hospital for bradycardia. She was noted to have cellulitis to the left lower extremity and Unna boot was placed due to blisters and increased swelling. She reports improvement to her left leg since being in the hospital and stability check her right plantar foot wound. She denies signs of infection. 8/23; since I last saw  this patient a lot has happened. She still has the area over the right first met head in the setting of previous transmetatarsal amputation. Firstly most importantly she underwent revascularization of her occluded right SFA. She underwent  directional atherectomy followed by drug-coated balloon angioplasty. She has no named vessel below the knee hopefully the revascularization of the right SFA will improve collateral flow. It is not felt however that she has any endovascular options. She had follow-up arterial studies noninvasive on 8/9. These showed an ABI of 0.44 at the right PTA. Monophasic waveforms. On the left her great toe ABI was 0.68. Her follow-up arterial Doppler showed a 50 to 74% stenosis in the proximal SFA and a 50 to 74% stenosis in the distal SFA mild to severe atherosclerosis noted throughout the extremity. Areas of shadowing plaque seen, unable to rule out higher grade stenosis she also had a fall apparently wearing a right foot forefoot off loader that we gave her. She therefore comes in in an ordinary running shoe today. Not certain if this is the fall that ended up in the hospital with bradycardia 9/6 area over the right first metatarsal head in the setting of her previous amputation and severe PAD. She is also a diabetic with known PAD status post attempt at revascularization. She is continued smoker Again the separation of visits in the clinic is somewhat disturbing if we are going to consider her for a total contact cast. She is not wearing anything to offload this stating it causes imbalance especially the forefoot off loader. She basically comes in in bedroom slippers She also had a fall this morning about an hour ago. She has a skin tear on the left dorsal forearm 9/13; areas over the right first metatarsal head in the setting of previous TMA and severe PAD. Again she comes in here in slippers. We have been using Hydrofera Blue. We use MolecuLight on this which was  essentially negative study Objective Constitutional Sitting or standing Blood Pressure is within target range for patient.. Pulse regular and within target range for patient.[name] Respirations regular, non-labored and within target range.. Temperature is normal and within the target range for the patient.[name] Appears in no distress. Vitals Time Taken: 2:18 PM, Height: 64 in, Weight: 222 lbs, BMI: 38.1, Temperature: 98.7 F, Pulse: 90 bpm, Respiratory Rate: 22 breaths/min, Blood Pressure: 130/73 mmHg, Capillary Blood Glucose: 246 mg/dl. General Notes: Wound exam; right foot first metatarsal head. Callus around this wound was better this week after last week's debridement. The surface is gritty and fibrinous I used a #3 curette to remove this. There is 0.9 cm of undermining from roughly 10-1 o'clock if this does not go to bone. No surrounding erythema Integumentary (Hair, Skin) Wound #16 status is Open. Original cause of wound was Gradually Appeared. The date acquired was: 02/05/2021. The wound has been in treatment 22 weeks. The wound is located on the Right,Plantar Amputation Site - Transmetatarsal. The wound measures 1cm length x 1cm width x 0.6cm depth; 0.785cm^2 area and 0.471cm^3 volume. There is Fat Layer (Subcutaneous Tissue) exposed. There is a medium amount of serous drainage noted. The wound margin is thickened. There is large (67-100%) red granulation within the wound bed. There is a small (1-33%) amount of necrotic tissue within the wound bed including Adherent Slough. Wound #21 status is Healed - Epithelialized. Original cause of wound was Skin T ear/Laceration. The date acquired was: 07/20/2021. The wound has been in treatment 1 weeks. The wound is located on the Left Forearm. The wound measures 0cm length x 0cm width x 0cm depth; 0cm^2 area and 0cm^3 volume. There is Fat Layer (Subcutaneous Tissue) exposed. There is a medium amount of serosanguineous drainage noted. The wound margin is  flat  and intact. There is large (67-100%) red, pink granulation within the wound bed. There is no necrotic tissue within the wound bed. Assessment Active Problems ICD-10 Unspecified open wound, right foot, subsequent encounter Type 2 diabetes mellitus with foot ulcer Type 2 diabetes mellitus with diabetic peripheral angiopathy without gangrene Venous insufficiency (chronic) (peripheral) Procedures Wound #16 Pre-procedure diagnosis of Wound #16 is a Diabetic Wound/Ulcer of the Lower Extremity located on the Right,Plantar Amputation Site - Transmetatarsal .Severity of Tissue Pre Debridement is: Fat layer exposed. There was a Excisional Skin/Subcutaneous Tissue Debridement with a total area of 1 sq cm performed by Ricard Dillon., MD. With the following instrument(s): Curette to remove Viable and Non-Viable tissue/material. Material removed includes Subcutaneous Tissue, Slough, Skin: Dermis, and Skin: Epidermis after achieving pain control using Lidocaine. No specimens were taken. A time out was conducted at 15:17, prior to the start of the procedure. A Minimum amount of bleeding was controlled with Pressure. The procedure was tolerated well with a pain level of 0 throughout and a pain level of 0 following the procedure. Post Debridement Measurements: 1cm length x 1cm width x 0.6cm depth; 0.471cm^3 volume. Character of Wound/Ulcer Post Debridement is improved. Severity of Tissue Post Debridement is: Fat layer exposed. Post procedure Diagnosis Wound #16: Same as Pre-Procedure Plan Follow-up Appointments: Return Appointment in 1 week. - with Dr. Arcola Jansky Shower/ Hygiene: May shower with protection but do not get wound dressing(s) wet. Edema Control - Lymphedema / SCD / Other: Elevate legs to the level of the heart or above for 30 minutes daily and/or when sitting, a frequency of: - throughout the day Avoid standing for long periods of time. Patient to wear own compression stockings every  day. - to left leg now Exercise regularly Off-Loading: Open toe surgical shoe to: - with felt padding to offload Additional Orders / Instructions: Stop/Decrease Smoking Follow Nutritious Diet WOUND #16: - Amputation Site - Transmetatarsal Wound Laterality: Plantar, Right Cleanser: Soap and Water (Home Health) 1 x Per Week/7 Days Discharge Instructions: May shower and wash wound with dial antibacterial soap and water prior to dressing change. Cleanser: Wound Cleanser (Home Health) 1 x Per Week/7 Days Discharge Instructions: Cleanse the wound with wound cleanser or normal saline prior to applying a clean dressing using gauze sponges, not tissue or cotton balls. Peri-Wound Care: Sween Lotion (Moisturizing lotion) (Home Health) 1 x Per Week/7 Days Discharge Instructions: Apply moisturizing lotion as directed Prim Dressing: Hydrofera Blue Classic Foam, 2x2 in (Home Health) 1 x Per Week/7 Days ary Discharge Instructions: Moisten with saline prior to applying to wound bed Secondary Dressing: Woven Gauze Sponge, Non-Sterile 4x4 in (Home Health) 1 x Per Week/7 Days Discharge Instructions: Apply over primary dressing as directed. Secondary Dressing: Optifoam Non-Adhesive Dressing, 4x4 in (Home Health) 1 x Per Week/7 Days Discharge Instructions: Foam donut to help offload Com pression Wrap: Kerlix Roll 4.5x3.1 (in/yd) (Home Health) 1 x Per Week/7 Days Discharge Instructions: Apply Kerlix and Coban compression as directed. Com pression Wrap: Coban Self-Adherent Wrap 4x5 (in/yd) (Home Health) 1 x Per Week/7 Days Discharge Instructions: Apply over Kerlix as directed. 1. MolecuLight study was negative 2. Debridement as noted 3. The patient has PAD status post an attempted revascularization. Continued cigarette smoking 4. I am going to need to find a way to offload this better. She did not tolerate even a surgical sandal. I have my doubts she will tolerate a total contact cast but that is all I can see  to offer  her. She does live with her sister so there is at least somebody at home. MolecuLight DX: 1st Scanned Wound The following wound was scanned with MolecuLight DX): right foot Fluorescence bacterial imaging was medically necessary today due to Wound previously healing as expected but has recently stalled (flattening of (Indication): the area/volume) There was no fluorescence suggestive of a high bacterial load on todays MolecuLight Results scan. As a result of todays scan, the following treatment plans were put in place. debridement and offloading MolecuLight Procedure The MolecularLight DX device was cleaned with a disinfectant wipe prior to use., The correct patient profile was confirmed and correct wound was verified., Range finder sensor used to ensure appropriate distance selected The following was completed: between imaging unit and wound bed, Room lights were turned off and the ambient light sensor was checked., The fluorescence icon was selected. Screen was tapped to enhance focus and the image was captured. Additional drapes were used to ensure adequate darknesso No Additional Scanned Wounds Did you scan any additional Woundso No Electronic Signature(s) Signed: 07/28/2021 3:40:47 PM By: Linton Ham MD Entered By: Linton Ham on 07/27/2021 15:37:06 -------------------------------------------------------------------------------- SuperBill Details Patient Name: Date of Service: [name], [name] 07/27/2021 Medical Record Number: [id number] Patient Account Number: [id number] Date of Birth/Sex: Treating RN: [id number] (60 y.o. Tonita Phoenix, Lauren Primary Care Provider: [place] [place] Other Clinician: Referring Provider: Treating Provider/Extender: Johnn Hai, [name] in Treatment: 22 Diagnosis Coding ICD-10 Codes Code Description P37.902I Unspecified open wound, right foot, subsequent encounter 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 2 diabetes mellitus with foot  ulcer E11.51 Type 2 diabetes mellitus with diabetic peripheral angiopathy without gangrene I87.2 Venous insufficiency (chronic) (peripheral) Facility Procedures CPT4 Code: [id number] Description: 92426 - DEB SUBQ TISSUE 20 SQ CM/< ICD-10 Diagnosis Description S91.301D Unspecified open wound, right foot, subsequent encounter 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 2 diabetes mellitus with foot ulcer Modifier: Quantity: 1 Physician Procedures : CPT4 Code Description Modifier [id number] - WC PHYS SUBQ TISS 20 SQ CM ICD-10 Diagnosis Description S91.301D Unspecified open wound, right foot, subsequent encounter 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 2 diabetes mellitus with foot ulcer Quantity: 1 Electronic Signature(s) Signed: 07/28/2021 3:40:47 PM By: Linton Ham MD Entered By: Linton Ham on 07/27/2021 15:37:32

## 2021-07-29 ENCOUNTER — Other Ambulatory Visit: Payer: Self-pay

## 2021-07-29 ENCOUNTER — Emergency Department (HOSPITAL_COMMUNITY): Payer: Medicare Other

## 2021-07-29 ENCOUNTER — Observation Stay (HOSPITAL_COMMUNITY)
Admission: EM | Admit: 2021-07-29 | Discharge: 2021-07-30 | Disposition: A | Discharge status: Home / Self Care | Payer: Medicare Other | Attending: Family Medicine | Admitting: Family Medicine

## 2021-07-29 ENCOUNTER — Other Ambulatory Visit (HOSPITAL_COMMUNITY): Payer: Self-pay

## 2021-07-29 DIAGNOSIS — D631 Anemia in chronic kidney disease: Secondary | ICD-10-CM | POA: Insufficient documentation

## 2021-07-29 DIAGNOSIS — J9601 Acute respiratory failure with hypoxia: Secondary | ICD-10-CM | POA: Diagnosis not present

## 2021-07-29 DIAGNOSIS — N1832 Chronic kidney disease, stage 3b: Secondary | ICD-10-CM | POA: Diagnosis not present

## 2021-07-29 DIAGNOSIS — I130 Hypertensive heart and chronic kidney disease with heart failure and stage 1 through stage 4 chronic kidney disease, or unspecified chronic kidney disease: Secondary | ICD-10-CM | POA: Diagnosis not present

## 2021-07-29 DIAGNOSIS — I5042 Chronic combined systolic (congestive) and diastolic (congestive) heart failure: Secondary | ICD-10-CM | POA: Diagnosis not present

## 2021-07-29 DIAGNOSIS — E119 Type 2 diabetes mellitus without complications: Secondary | ICD-10-CM | POA: Diagnosis present

## 2021-07-29 DIAGNOSIS — Z794 Long term (current) use of insulin: Secondary | ICD-10-CM | POA: Insufficient documentation

## 2021-07-29 DIAGNOSIS — Z20822 Contact with and (suspected) exposure to covid-19: Secondary | ICD-10-CM | POA: Insufficient documentation

## 2021-07-29 DIAGNOSIS — F17210 Nicotine dependence, cigarettes, uncomplicated: Secondary | ICD-10-CM | POA: Diagnosis not present

## 2021-07-29 DIAGNOSIS — I739 Peripheral vascular disease, unspecified: Secondary | ICD-10-CM | POA: Diagnosis present

## 2021-07-29 DIAGNOSIS — Z7901 Long term (current) use of anticoagulants: Secondary | ICD-10-CM | POA: Diagnosis not present

## 2021-07-29 DIAGNOSIS — E1122 Type 2 diabetes mellitus with diabetic chronic kidney disease: Secondary | ICD-10-CM | POA: Diagnosis not present

## 2021-07-29 DIAGNOSIS — I480 Paroxysmal atrial fibrillation: Secondary | ICD-10-CM | POA: Diagnosis not present

## 2021-07-29 DIAGNOSIS — R059 Cough, unspecified: Secondary | ICD-10-CM | POA: Diagnosis present

## 2021-07-29 DIAGNOSIS — E1159 Type 2 diabetes mellitus with other circulatory complications: Secondary | ICD-10-CM | POA: Diagnosis present

## 2021-07-29 DIAGNOSIS — Z7902 Long term (current) use of antithrombotics/antiplatelets: Secondary | ICD-10-CM | POA: Insufficient documentation

## 2021-07-29 DIAGNOSIS — I152 Hypertension secondary to endocrine disorders: Secondary | ICD-10-CM | POA: Diagnosis present

## 2021-07-29 DIAGNOSIS — Z951 Presence of aortocoronary bypass graft: Secondary | ICD-10-CM | POA: Diagnosis not present

## 2021-07-29 DIAGNOSIS — E1169 Type 2 diabetes mellitus with other specified complication: Secondary | ICD-10-CM | POA: Diagnosis present

## 2021-07-29 DIAGNOSIS — Z720 Tobacco use: Secondary | ICD-10-CM | POA: Diagnosis present

## 2021-07-29 DIAGNOSIS — J449 Chronic obstructive pulmonary disease, unspecified: Secondary | ICD-10-CM | POA: Insufficient documentation

## 2021-07-29 DIAGNOSIS — J441 Chronic obstructive pulmonary disease with (acute) exacerbation: Secondary | ICD-10-CM | POA: Diagnosis not present

## 2021-07-29 DIAGNOSIS — E039 Hypothyroidism, unspecified: Secondary | ICD-10-CM | POA: Diagnosis present

## 2021-07-29 DIAGNOSIS — I2510 Atherosclerotic heart disease of native coronary artery without angina pectoris: Secondary | ICD-10-CM | POA: Diagnosis present

## 2021-07-29 DIAGNOSIS — E1151 Type 2 diabetes mellitus with diabetic peripheral angiopathy without gangrene: Secondary | ICD-10-CM | POA: Diagnosis present

## 2021-07-29 DIAGNOSIS — N183 Chronic kidney disease, stage 3 unspecified: Secondary | ICD-10-CM | POA: Diagnosis present

## 2021-07-29 DIAGNOSIS — I10 Essential (primary) hypertension: Secondary | ICD-10-CM | POA: Diagnosis present

## 2021-07-29 DIAGNOSIS — I5030 Unspecified diastolic (congestive) heart failure: Secondary | ICD-10-CM | POA: Diagnosis present

## 2021-07-29 DIAGNOSIS — Z79899 Other long term (current) drug therapy: Secondary | ICD-10-CM | POA: Insufficient documentation

## 2021-07-29 LAB — I-STAT VENOUS BLOOD GAS, ED
Acid-Base Excess: 8 mmol/L — ABNORMAL HIGH (ref 0.0–2.0)
Bicarbonate: 32.2 mmol/L — ABNORMAL HIGH (ref 20.0–28.0)
Calcium, Ion: 1.01 mmol/L — ABNORMAL LOW (ref 1.15–1.40)
HCT: 30 % — ABNORMAL LOW (ref 36.0–46.0)
Hemoglobin: 10.2 g/dL — ABNORMAL LOW (ref 12.0–15.0)
O2 Saturation: 98 %
Potassium: 4.8 mmol/L (ref 3.5–5.1)
Sodium: 141 mmol/L (ref 135–145)
TCO2: 33 mmol/L — ABNORMAL HIGH (ref 22–32)
pCO2, Ven: 42.3 mmHg — ABNORMAL LOW (ref 44.0–60.0)
pH, Ven: 7.489 — ABNORMAL HIGH (ref 7.250–7.430)
pO2, Ven: 96 mmHg — ABNORMAL HIGH (ref 32.0–45.0)

## 2021-07-29 LAB — CBC WITH DIFFERENTIAL/PLATELET
Abs Immature Granulocytes: 0.08 10*3/uL — ABNORMAL HIGH (ref 0.00–0.07)
Basophils Absolute: 0.1 10*3/uL (ref 0.0–0.1)
Basophils Relative: 0 %
Eosinophils Absolute: 0.1 10*3/uL (ref 0.0–0.5)
Eosinophils Relative: 1 %
HCT: 34.3 % — ABNORMAL LOW (ref 36.0–46.0)
Hemoglobin: 9.9 g/dL — ABNORMAL LOW (ref 12.0–15.0)
Immature Granulocytes: 1 %
Lymphocytes Relative: 7 %
Lymphs Abs: 1 10*3/uL (ref 0.7–4.0)
MCH: 25.9 pg — ABNORMAL LOW (ref 26.0–34.0)
MCHC: 28.9 g/dL — ABNORMAL LOW (ref 30.0–36.0)
MCV: 89.8 fL (ref 80.0–100.0)
Monocytes Absolute: 1.1 10*3/uL — ABNORMAL HIGH (ref 0.1–1.0)
Monocytes Relative: 8 %
Neutro Abs: 12.1 10*3/uL — ABNORMAL HIGH (ref 1.7–7.7)
Neutrophils Relative %: 83 %
Platelets: 343 10*3/uL (ref 150–400)
RBC: 3.82 MIL/uL — ABNORMAL LOW (ref 3.87–5.11)
RDW: 16.7 % — ABNORMAL HIGH (ref 11.5–15.5)
WBC: 14.4 10*3/uL — ABNORMAL HIGH (ref 4.0–10.5)
nRBC: 0 % (ref 0.0–0.2)

## 2021-07-29 LAB — URINALYSIS, ROUTINE W REFLEX MICROSCOPIC
Bilirubin Urine: NEGATIVE
Glucose, UA: >=500 mg/dL — AB
Hgb urine dipstick: NEGATIVE
Ketones, ur: 5 mg/dL — AB
Leukocytes,Ua: NEGATIVE
Nitrite: NEGATIVE
Protein, ur: 30 mg/dL — AB
Specific Gravity, Urine: 1.015 (ref 1.005–1.030)
pH: 8 (ref 5.0–8.0)

## 2021-07-29 LAB — COMPREHENSIVE METABOLIC PANEL
ALT: 20 U/L (ref 0–44)
AST: 21 U/L (ref 15–41)
Albumin: 2.9 g/dL — ABNORMAL LOW (ref 3.5–5.0)
Alkaline Phosphatase: 132 U/L — ABNORMAL HIGH (ref 38–126)
Anion gap: 10 (ref 5–15)
BUN: 19 mg/dL (ref 6–20)
CO2: 25 mmol/L (ref 22–32)
Calcium: 8.8 mg/dL — ABNORMAL LOW (ref 8.9–10.3)
Chloride: 103 mmol/L (ref 98–111)
Creatinine, Ser: 1.49 mg/dL — ABNORMAL HIGH (ref 0.44–1.00)
GFR, Estimated: 40 mL/min — ABNORMAL LOW (ref >60)
Glucose, Bld: 218 mg/dL — ABNORMAL HIGH (ref 70–99)
Potassium: 4.4 mmol/L (ref 3.5–5.1)
Sodium: 138 mmol/L (ref 135–145)
Total Bilirubin: 0.6 mg/dL (ref 0.3–1.2)
Total Protein: 6.7 g/dL (ref 6.5–8.1)

## 2021-07-29 LAB — TROPONIN I (HIGH SENSITIVITY)
Troponin I (High Sensitivity): 17 ng/L (ref <18)
Troponin I (High Sensitivity): 21 ng/L — ABNORMAL HIGH (ref <18)

## 2021-07-29 LAB — RESP PANEL BY RT-PCR (FLU A&B, COVID) ARPGX2
Influenza A by PCR: NEGATIVE
Influenza B by PCR: NEGATIVE
SARS Coronavirus 2 by RT PCR: NEGATIVE

## 2021-07-29 LAB — BRAIN NATRIURETIC PEPTIDE: B Natriuretic Peptide: 285.1 pg/mL — ABNORMAL HIGH (ref 0.0–100.0)

## 2021-07-29 LAB — LIPASE, BLOOD: Lipase: 27 U/L (ref 11–51)

## 2021-07-29 MED ORDER — EMPAGLIFLOZIN 10 MG PO TABS
10.0000 mg | ORAL_TABLET | Freq: Every day | ORAL | Status: DC
  Administered 2021-07-30: 10 mg via ORAL
  Filled 2021-07-29: qty 1

## 2021-07-29 MED ORDER — ONDANSETRON HCL 4 MG PO TABS: 4.0000 mg | ORAL_TABLET | Freq: Four times a day (QID) | ORAL | Status: DC | PRN

## 2021-07-29 MED ORDER — FLUOXETINE HCL 20 MG PO CAPS
20.0000 mg | ORAL_CAPSULE | Freq: Every day | ORAL | Status: DC
  Administered 2021-07-30: 20 mg via ORAL
  Filled 2021-07-29: qty 1

## 2021-07-29 MED ORDER — METHYLPREDNISOLONE SODIUM SUCC 125 MG IJ SOLR: 80.0000 mg | INTRAMUSCULAR | Status: DC

## 2021-07-29 MED ORDER — AZITHROMYCIN 250 MG PO TABS
250.0000 mg | ORAL_TABLET | Freq: Every day | ORAL | Status: DC
  Administered 2021-07-30: 250 mg via ORAL
  Filled 2021-07-29: qty 1

## 2021-07-29 MED ORDER — TORSEMIDE 20 MG PO TABS: 60.0000 mg | ORAL_TABLET | ORAL | Status: DC

## 2021-07-29 MED ORDER — METHYLPREDNISOLONE SODIUM SUCC 40 MG IJ SOLR: 40.0000 mg | Freq: Two times a day (BID) | INTRAMUSCULAR | Status: DC

## 2021-07-29 MED ORDER — AMIODARONE HCL 200 MG PO TABS
100.0000 mg | ORAL_TABLET | Freq: Every day | ORAL | Status: DC
  Administered 2021-07-30: 100 mg via ORAL
  Filled 2021-07-29: qty 1

## 2021-07-29 MED ORDER — IPRATROPIUM-ALBUTEROL 0.5-2.5 (3) MG/3ML IN SOLN
3.0000 mL | RESPIRATORY_TRACT | Status: AC
  Administered 2021-07-29 (×3): 3 mL via RESPIRATORY_TRACT
  Filled 2021-07-29: qty 6
  Filled 2021-07-29: qty 3

## 2021-07-29 MED ORDER — ONDANSETRON HCL 4 MG/2ML IJ SOLN: 4.0000 mg | Freq: Four times a day (QID) | INTRAMUSCULAR | Status: DC | PRN

## 2021-07-29 MED ORDER — ACETAMINOPHEN 650 MG RE SUPP: 650.0000 mg | Freq: Four times a day (QID) | RECTAL | Status: DC | PRN

## 2021-07-29 MED ORDER — AMLODIPINE BESYLATE 5 MG PO TABS
10.0000 mg | ORAL_TABLET | Freq: Every day | ORAL | Status: DC
  Administered 2021-07-30: 10 mg via ORAL
  Filled 2021-07-29: qty 2

## 2021-07-29 MED ORDER — APIXABAN 5 MG PO TABS
5.0000 mg | ORAL_TABLET | Freq: Two times a day (BID) | ORAL | Status: DC
  Administered 2021-07-30 (×2): 5 mg via ORAL
  Filled 2021-07-29 (×2): qty 1

## 2021-07-29 MED ORDER — CLOPIDOGREL BISULFATE 75 MG PO TABS
75.0000 mg | ORAL_TABLET | Freq: Every day | ORAL | Status: DC
  Administered 2021-07-30: 75 mg via ORAL
  Filled 2021-07-29: qty 1

## 2021-07-29 MED ORDER — ACETAMINOPHEN 325 MG PO TABS
650.0000 mg | ORAL_TABLET | Freq: Four times a day (QID) | ORAL | Status: DC | PRN
  Administered 2021-07-30: 650 mg via ORAL
  Filled 2021-07-29: qty 2

## 2021-07-29 MED ORDER — MOMETASONE FURO-FORMOTEROL FUM 200-5 MCG/ACT IN AERO
2.0000 | INHALATION_SPRAY | Freq: Two times a day (BID) | RESPIRATORY_TRACT | Status: DC
  Filled 2021-07-29: qty 8.8

## 2021-07-29 MED ORDER — PANTOPRAZOLE SODIUM 40 MG PO TBEC
40.0000 mg | DELAYED_RELEASE_TABLET | Freq: Every day | ORAL | Status: DC
  Administered 2021-07-29 – 2021-07-30 (×2): 40 mg via ORAL
  Filled 2021-07-29 (×2): qty 1

## 2021-07-29 MED ORDER — AZITHROMYCIN 250 MG PO TABS
500.0000 mg | ORAL_TABLET | Freq: Every day | ORAL | Status: AC
  Administered 2021-07-29: 500 mg via ORAL
  Filled 2021-07-29: qty 2

## 2021-07-29 MED ORDER — INSULIN ASPART 100 UNIT/ML IJ SOLN
0.0000 [IU] | Freq: Three times a day (TID) | INTRAMUSCULAR | Status: DC
  Administered 2021-07-30: 5 [IU] via SUBCUTANEOUS

## 2021-07-29 MED ORDER — GUAIFENESIN ER 600 MG PO TB12
600.0000 mg | ORAL_TABLET | Freq: Two times a day (BID) | ORAL | Status: DC
  Administered 2021-07-30 (×2): 600 mg via ORAL
  Filled 2021-07-29 (×2): qty 1

## 2021-07-29 MED ORDER — INSULIN ASPART 100 UNIT/ML IJ SOLN
0.0000 [IU] | Freq: Every day | INTRAMUSCULAR | Status: DC
  Administered 2021-07-30: 3 [IU] via SUBCUTANEOUS

## 2021-07-29 MED ORDER — TORSEMIDE 20 MG PO TABS
80.0000 mg | ORAL_TABLET | ORAL | Status: DC
  Administered 2021-07-30: 80 mg via ORAL
  Filled 2021-07-29: qty 4

## 2021-07-29 MED ORDER — ALBUTEROL SULFATE (2.5 MG/3ML) 0.083% IN NEBU
2.5000 mg | INHALATION_SOLUTION | RESPIRATORY_TRACT | Status: DC | PRN
  Administered 2021-07-30: 2.5 mg via RESPIRATORY_TRACT
  Filled 2021-07-29: qty 3

## 2021-07-29 MED ORDER — ALBUTEROL SULFATE HFA 108 (90 BASE) MCG/ACT IN AERS
2.0000 | INHALATION_SPRAY | Freq: Once | RESPIRATORY_TRACT | Status: AC
  Administered 2021-07-29: 2 via RESPIRATORY_TRACT
  Filled 2021-07-29: qty 6.7

## 2021-07-29 MED ORDER — HYDRALAZINE HCL 25 MG PO TABS
25.0000 mg | ORAL_TABLET | Freq: Three times a day (TID) | ORAL | Status: DC
  Administered 2021-07-30 (×2): 25 mg via ORAL
  Filled 2021-07-29 (×2): qty 1

## 2021-07-29 MED ORDER — IPRATROPIUM-ALBUTEROL 0.5-2.5 (3) MG/3ML IN SOLN: 3.0000 mL | Freq: Three times a day (TID) | RESPIRATORY_TRACT | Status: DC

## 2021-07-29 MED ORDER — INSULIN ASPART 100 UNIT/ML IJ SOLN
8.0000 [IU] | Freq: Three times a day (TID) | INTRAMUSCULAR | Status: DC
  Administered 2021-07-30: 8 [IU] via SUBCUTANEOUS

## 2021-07-29 MED ORDER — ATORVASTATIN CALCIUM 40 MG PO TABS
40.0000 mg | ORAL_TABLET | Freq: Every evening | ORAL | Status: DC
  Administered 2021-07-29: 40 mg via ORAL
  Filled 2021-07-29: qty 1

## 2021-07-29 MED ORDER — METHYLPREDNISOLONE SODIUM SUCC 125 MG IJ SOLR
125.0000 mg | Freq: Once | INTRAMUSCULAR | Status: AC
  Administered 2021-07-29: 125 mg via INTRAVENOUS
  Filled 2021-07-29: qty 2

## 2021-07-29 MED ORDER — TORSEMIDE 20 MG PO TABS: 60.0000 mg | ORAL_TABLET | Freq: Every day | ORAL | Status: DC

## 2021-07-29 MED ORDER — LEVOTHYROXINE SODIUM 25 MCG PO TABS
50.0000 ug | ORAL_TABLET | Freq: Every day | ORAL | Status: DC
  Administered 2021-07-30: 50 ug via ORAL
  Filled 2021-07-29: qty 2

## 2021-07-29 MED ORDER — FOLIC ACID 1 MG PO TABS
1.0000 mg | ORAL_TABLET | Freq: Every day | ORAL | Status: DC
  Administered 2021-07-30 (×2): 1 mg via ORAL
  Filled 2021-07-29 (×2): qty 1

## 2021-07-29 MED ORDER — NORTRIPTYLINE HCL 25 MG PO CAPS
50.0000 mg | ORAL_CAPSULE | Freq: Two times a day (BID) | ORAL | Status: DC
  Administered 2021-07-30 (×2): 50 mg via ORAL
  Filled 2021-07-29 (×2): qty 2

## 2021-07-29 MED ORDER — INSULIN GLARGINE-YFGN 100 UNIT/ML ~~LOC~~ SOLN
30.0000 [IU] | Freq: Every day | SUBCUTANEOUS | Status: DC
  Administered 2021-07-30: 30 [IU] via SUBCUTANEOUS
  Filled 2021-07-29: qty 0.3

## 2021-07-29 MED ORDER — SODIUM CHLORIDE 0.9% FLUSH
3.0000 mL | Freq: Two times a day (BID) | INTRAVENOUS | Status: DC
  Administered 2021-07-30: 3 mL via INTRAVENOUS

## 2021-07-29 NOTE — ED Provider Notes (Signed)
[facility] EMERGENCY DEPARTMENT Provider Note   CSN: [id number] Arrival date & time: 07/29/21  1245     History No chief complaint on file.   [name] is a 60 y.o. female.  HPI 60 year old female presents with a chief complaint of A. fib.  Patient has a paramedics that comes to help her and when they were checking her today she seemed to be in A. fib with RVR so they called 911.  Patient tells me that for a few days she has been having some indigestion/chest burning, cough with white sputum, and nausea and vomiting.  Also some sneezing.  She denies dyspnea.  Has chronic leg swelling that might be a little worse.  She denies any fevers.  No urinary symptoms or diarrhea.  She has not been having abdominal pain.  She was found to be in A. fib with RVR and on arrival her heart rate was in the 130s but when I got to the room she seemed to have spontaneously converted into sinus rhythm.  Past Medical History:  Diagnosis Date   Alcohol abuse    Alcoholic cirrhosis ([place])    Anemia    Anxiety    Arthritis    "knees" (11/26/2018)   B12 deficiency    CAD (coronary artery disease)    a. 11/10/2014 Cath: LM nl, LAD min irregs, D1 30 ost, D2 50d, LCX 64m OM1 80 p/m (1.5 mm vessel), OM2 412mRCA nondom 9065mmed rx.. Demand ischemia in the setting of rapid a-fib.   Cardiomyopathy (HCC[place]  Carotid artery disease (HCCE[place]  a. 01/2015 Carotid Angio: RICA [id number]. [id number] L CEA; c. 05/2019 Carotid U/S: RICA 100. RECA >50. LICA [id number] Cellulitis    lower extremities   CHF (congestive heart failure) ([name])    Chronic combined systolic and diastolic CHF (congestive heart failure) (HCCKingsport  a. 10/2014 Echo: EF 40-45%; b. 10/2018 Echo: EF 45-50%, gr2 DD; c. 11/2019 Echo: EF 50%, mild LVH, gr2 DD (restrictive), antlat HK, Nl RV fxn. Mild BAE. RVSP 59.9mm11m   CKD (chronic kidney disease), stage III ([name])    Cocaine abuse ([name])    COPD (chronic obstructive pulmonary  disease) ([name])    Critical lower limb ischemia ([name])    Depression    Diabetes mellitus without complication ([name])    Diabetic peripheral neuropathy ([name])    DVT (deep venous thrombosis) ([name])    Dyspnea    Elevated troponin    a. Chronic elevation.   GERD (gastroesophageal reflux disease)    Hyperlipemia    Hypertension    Hypokalemia    Hypomagnesemia    Hypothyroidism    Marijuana abuse    Narcotic abuse ([name])[place] Noncompliance    NSVT (nonsustained ventricular tachycardia) ([name])    Obesity    PAF (paroxysmal atrial fibrillation) ([name])    Paroxysmal atrial tachycardia ([name])    Peripheral arterial disease ([name])[place] a. 01/2015 Angio/PTA: RSFA 99 (atherectomy/pta) - 1 vessel runoff via diff dzs peroneal; b. 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   Pneumonia    "once or twice" (11/26/2018)   Poorly controlled type 2 diabetes mellitus ([name])[place] Renal disorder    Renal insufficiency    a. 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   Sleep apnea    "couldn't handle wearing the mask" (11/26/2018)   Symptomatic bradycardia  a. Avoid AV blocking agent per EP. Prev req temp wire in 2017.   Tobacco abuse     Patient Active Problem List   Diagnosis Date Noted   Bradycardia 06/13/2021   Uncontrolled type 2 diabetes mellitus with hyperglycemia ([place]) 06/13/2021   Acute kidney injury superimposed on CKD ([place]) 06/13/2021   Critical ischemia of foot ([place]) 06/07/2021   Fall 05/05/2021   PAF (paroxysmal atrial fibrillation) ([place]) 05/05/2021   COPD (chronic obstructive pulmonary disease) ([place]) 05/05/2021   DM (diabetes mellitus), type 2 with complications ([place]) 99991111   PAD (peripheral artery disease) ([place]) 05/05/2021   Cellulitis 05/05/2021   Acute on chronic combined systolic and diastolic CHF (congestive heart failure) ([place]) 05/05/2021   OSA (obstructive sleep apnea) 05/05/2021   CKD (chronic kidney disease) stage 3, GFR 30-59 ml/min ([place]) 05/05/2021   AKI (acute kidney injury)  ([place]) 05/04/2021   Pressure injury of skin 05/04/2021   Ulcer of foot, unspecified laterality, limited to breakdown of skin ([place]) 02/17/2021   Altered mental status 01/02/2021   AF (paroxysmal atrial fibrillation) ([place]) 01/02/2021   CAD (coronary artery disease) 01/02/2021   PVD (peripheral vascular disease) ([place]) 01/02/2021   Acute renal failure superimposed on stage 4 chronic kidney disease ([place]) 01/02/2021   Diabetes mellitus type 2, uncontrolled, with complications ([place]) [id number]   Diabetic nephropathy ([place]) 01/02/2021   Low back pain 06/01/2020   Hyperlipidemia 04/03/2020   Muscle weakness 03/20/2020   Closed fracture of neck of right radius 03/13/2020   Pain in elbow 03/12/2020   PAD (peripheral artery disease) ([place]) 12/26/2019   Acute renal failure ([name])    Acute respiratory failure with hypoxia ([place]) 12/02/2019   Acute exacerbation of CHF (congestive heart failure) ([place]) 12/01/2019   Cellulitis in diabetic foot ([place]) 07/08/2019   Osteomyelitis ([place]) 06/21/2019   NICM (nonischemic cardiomyopathy) ([place]) 06/20/2019   Non-healing ulcer ([place]) 06/20/2019   Acute CHF (congestive heart failure) ([place]) 11/26/2018   CHF (congestive heart failure) ([place]) 11/26/2018   Acute respiratory failure ([place]) 10/21/2018   AKI (acute kidney injury) ([place]) 08/18/2018   Coagulation disorder ([place]) 08/09/2017   Depression 07/21/2017   At risk for adverse drug reaction 06/20/2017   Peripheral neuropathy 06/20/2017   Acute osteomyelitis of right foot ([place]) 06/13/2017   S/P transmetatarsal amputation of foot, right ([place]) 06/05/2017   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 ([place]) 05/19/2017   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[place])    SIRS (systemic inflammatory response syndrome) ([place]) 04/06/2017   CKD (chronic kidney disease), stage III ([place]) 11/24/2016   Suspected sleep apnea 11/24/2016   Ulcer of toe of right foot, with necrosis of bone ([place])  10/27/2016   Ulcer of left lower leg ([place]) 05/19/2016   Severe obesity (BMI >= 40) ([place]) 02/24/2016   COPD GOLD 0  02/24/2016   Morbid obesity ([place]) 02/24/2016   Encounter for therapeutic drug monitoring 02/10/2016   Symptomatic bradycardia 01/12/2016   Essential hypertension 12/22/2015   Chronic combined systolic and diastolic CHF (congestive heart failure) ([place])    [place]    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 11/08/2015   Tobacco abuse 10/23/2015   Coronary artery disease    DOE (dyspnea on exertion) 04/29/2015   Diabetes mellitus type 2, uncontrolled ([place]) 02/08/2015   Influenza A 02/07/2015   Wrist fracture, left, with routine healing, subsequent encounter 02/05/2015   Wrist fracture, left, closed, initial encounter 01/29/2015   PAF (paroxysmal atrial fibrillation) ([name]) 01/16/2015   Carotid arterial disease (  [place]) 01/16/2015   Claudication ([place]) 01/15/2015   Demand ischemia ([place]) 10/29/2014   Insomnia 02/03/2014   COPD with acute exacerbation ([place]) 02/01/2014   S/P peripheral artery angioplasty - [place] atherectomy; R SFA 09/11/2013    Class: Acute   Leg pain, bilateral 08/19/2013   Hypothyroidism 07/31/2013   Cellulitis 06/13/2013   History of cocaine abuse ([place]) 06/13/2013   Long term current use of anticoagulant therapy 05/20/2013   Alcohol abuse    Narcotic abuse ([place])    Marijuana abuse    Alcoholic cirrhosis ([place])    DM (diabetes mellitus), type 2 with peripheral vascular complications [facility])     Past Surgical History:  Procedure Laterality Date   ABDOMINAL AORTOGRAM N/A 06/26/2019   Procedure: ABDOMINAL AORTOGRAM;  Surgeon: Wellington Hampshire, MD;  Location: [facility] CV LAB;  Service: Cardiovascular;  Laterality: N/A;   ABDOMINAL AORTOGRAM W/LOWER EXTREMITY N/A 06/07/2021   Procedure: ABDOMINAL AORTOGRAM W/LOWER EXTREMITY;  Surgeon: [name], MD;  Location: [facility] CV LAB;  Service: Cardiovascular;  Laterality: N/A;   AMPUTATION Right 06/14/2017   Procedure:  Right foot transmetatarsal amputation;  Surgeon: Newt Minion, MD;  Location: [facility];  Service: Orthopedics;  Laterality: Right;   AMPUTATION Left 06/28/2019   Procedure: AMPUTATION LEFT FIFTH TOE;  Surgeon: Rosetta Posner, MD;  Location: [facility];  Service: Vascular;  Laterality: Left;   AMPUTATION TOE Right 04/28/2017   Procedure: AMPUTATION OF RIGHT SECOND RAY;  Surgeon: Newt Minion, MD;  Location: [facility];  Service: Orthopedics;  Laterality: Right;   CARDIAC CATHETERIZATION     CARDIAC CATHETERIZATION N/A 01/12/2016   Procedure: Temporary Wire;  Surgeon: Minus Breeding, MD;  Location: [facility] CV LAB;  Service: Cardiovascular;  Laterality: N/A;   CARDIOVERSION  ~ 02/2013   "twice"    CAROTID ANGIOGRAM N/A 01/15/2015   Procedure: CAROTID ANGIOGRAM;  Surgeon: [name], MD;  Location: [facility] CATH LAB;  Service: Cardiovascular;  Laterality: N/A;   DILATION AND CURETTAGE OF UTERUS  1988   ENDARTERECTOMY Left 02/19/2015   Procedure: LEFT CAROTID ENDARTERECTOMY ;  Surgeon: Serafina Mitchell, MD;  Location: [facility];  Service: Vascular;  Laterality: Left;   FEMORAL-TIBIAL BYPASS GRAFT Left 06/28/2019   Procedure: BYPASS GRAFT LEFT LEG FEMORAL TO ANTERIOR TIBIAL ARTERY using LEFT GREATER SAPHENOUS VEIN;  Surgeon: Rosetta Posner, MD;  Location: [facility];  Service: Vascular;  Laterality: Left;   LEFT HEART CATHETERIZATION WITH CORONARY ANGIOGRAM N/A 10/31/2014   Procedure: LEFT HEART CATHETERIZATION WITH CORONARY ANGIOGRAM;  Surgeon: Burnell Blanks, MD;  Location: [facility] CATH LAB;  Service: Cardiovascular;  Laterality: N/A;   LOWER EXTREMITY ANGIOGRAM N/A 09/10/2013   Procedure: LOWER EXTREMITY ANGIOGRAM;  Surgeon: [name], MD;  Location[facility] LAB;  Service: Cardiovascular;  Laterality: N/A;   LOWER EXTREMITY ANGIOGRAM N/A 01/15/2015   Procedure: LOWER EXTREMITY ANGIOGRAM;  Surgeon: [name], MD;  Location: [facility] CATH LAB;  Service: Cardiovascular;  Laterality: N/A;   LOWER EXTREMITY  ANGIOGRAPHY N/A 04/13/2017   Procedure: Lower Extremity Angiography;  Surgeon: [name], MD;  Location: [facility] [facility] CV LAB;  Service: Cardiovascular;  Laterality: N/A;   LOWER EXTREMITY ANGIOGRAPHY Left 06/26/2019   Procedure: LOWER EXTREMITY ANGIOGRAPHY;  Surgeon: Wellington Hampshire, MD;  Location: [place] CV LAB;  Service: Cardiovascular;  Laterality: Left;   PERIPHERAL VASCULAR ATHERECTOMY Right 06/07/2021   Procedure: PERIPHERAL VASCULAR ATHERECTOMY;  Surgeon: [name], MD;  Location: [place] CV  LAB;  Service: Cardiovascular;  Laterality: Right;   PERIPHERAL VASCULAR BALLOON ANGIOPLASTY Left 06/26/2019   Procedure: PERIPHERAL VASCULAR BALLOON ANGIOPLASTY;  Surgeon: Wellington Hampshire, MD;  Location: [place] CV LAB;  Service: Cardiovascular;  Laterality: Left;  unable to cross lt sfa occlusion   PERIPHERAL VASCULAR INTERVENTION  04/13/2017   Procedure: Peripheral Vascular Intervention;  Surgeon: [name], MD;  Location: [place] CV LAB;  Service: Cardiovascular;;   PRESSURE SENSOR/CARDIOMEMS N/A 02/05/2020   Procedure: PRESSURE SENSOR/CARDIOMEMS;  Surgeon: Larey Dresser, MD;  Location: [place] CV LAB;  Service: Cardiovascular;  Laterality: N/A;   VEIN HARVEST Left 06/28/2019   Procedure: LEFT LEG GREATER SAPHENOUS VEIN HARVEST;  Surgeon: Rosetta Posner, MD;  Location: [facility] OR;  Service: Vascular;  Laterality: Left;     OB History     Gravida  1   Para  1   Term  1   Preterm  0   AB  0   Living         SAB  0   IAB  0   Ectopic  0   Multiple      Live Births              Family History  Problem Relation Age of Onset   Hypertension Mother    Diabetes Mother    Cancer Mother        breast, ovarian, colon   Clotting disorder Mother    Heart disease Mother    Heart attack Mother    Breast cancer Mother        in 63's   Hypertension Father    Heart disease Father    Emphysema Sister        smoked    Social History    Tobacco Use   Smoking status: Every Day    Packs/day: 1.00    Years: 44.00    Pack years: 44.00    Types: E-cigarettes, Cigarettes   Smokeless tobacco: Former    Types: Snuff  Vaping Use   Vaping Use: Former   Devices: 11/26/2018 "stopped months ago"  Substance Use Topics   Alcohol use: Yes    Comment: occ   Drug use: Yes    Types: "Crack" cocaine, Marijuana, Oxycodone    Home Medications Prior to Admission medications   Medication Sig Start Date End Date Taking? Authorizing Provider  ACCU-CHEK GUIDE test strip USE TO CHECK BLOOD SUGAR FOUR TIMES DAILY 02/25/20   [provider]  acetaminophen (TYLENOL) 500 MG tablet Take 500 mg by mouth every 6 (six) hours as needed for moderate pain.    [provider]  albuterol (VENTOLIN HFA) 108 (90 Base) MCG/ACT inhaler Inhale 2 puffs into the lungs every 6 (six) hours as needed for wheezing or shortness of breath. 07/08/19   Oretha Milch D, MD  amiodarone (PACERONE) 200 MG tablet Take 0.5 tablets (100 mg total) by mouth daily. 10/15/20   Larey Dresser, MD  amLODipine (NORVASC) 10 MG tablet Take 1 tablet (10 mg total) by mouth daily. 10/15/20   Larey Dresser, MD  apixaban (ELIQUIS) 5 MG TABS tablet Take 5 mg by mouth 2 (two) times daily.    [provider]  atorvastatin (LIPITOR) 40 MG tablet Take 40 mg by mouth every evening.    [provider]  budesonide-formoterol (SYMBICORT) 160-4.5 MCG/ACT inhaler Inhale 2 puffs into the lungs 2 (two) times daily. 12/09/19   Eugenie Filler, MD  clopidogrel (PLAVIX) 75 MG tablet Take 1 tablet (75 mg total) by mouth daily with breakfast. 06/09/21   Reino Bellis B, NP  colchicine 0.6 MG tablet Take 1 tablet (0.6 mg total) by mouth daily. 07/22/21   Charlott Rakes, MD  Continuous Blood Gluc Sensor (FREESTYLE LIBRE 2 SENSOR) MISC USE 1 (ONE) EACH EVERY 2 WEEKS 07/14/21   Charlott Rakes, MD  Dulaglutide (TRULICITY) A999333 [id number] SOPN Inject 0.75 mg into the skin  every Friday. 07/16/21   Charlott Rakes, MD  empagliflozin (JARDIANCE) 10 MG TABS tablet Take 1 tablet (10 mg total) by mouth daily before breakfast. 04/01/21   Clegg, [name] D, NP  FLUoxetine (PROZAC) 20 MG capsule Take 1 capsule (20 mg total) by mouth at bedtime. 07/14/21   Charlott Rakes, MD  folic acid (FOLVITE) 1 MG tablet Take 1 tablet (1 mg total) by mouth daily. 07/14/21   Charlott Rakes, MD  hydrALAZINE (APRESOLINE) 25 MG tablet Take 1 tablet (25 mg total) by mouth 3 (three) times daily. 07/23/21   Larey Dresser, MD  insulin glargine (LANTUS) 100 UNIT/ML Solostar Pen Inject 30 Units into the skin daily. 07/14/21 07/14/22  Charlott Rakes, MD  insulin lispro (HUMALOG) 100 UNIT/ML KwikPen Inject 8 Units into the skin 3 (three) times daily with meals. 01/05/21   Allie Bossier, MD  Insulin Pen Needle 32G X 4 MM MISC USE AS DIRECTED WITH INSULIN PENS 01/05/21 01/05/22  Allie Bossier, MD  levothyroxine (SYNTHROID, LEVOTHROID) 50 MCG tablet Take 1 tablet (50 mcg total) by mouth daily. 08/09/17   Medina-Vargas, Monina C, NP  losartan (COZAAR) 100 MG tablet Take 1 tablet (100 mg total) by mouth daily. Patient taking differently: Take 100 mg by mouth at bedtime. 06/16/21   [place]ford, Maricela Bo, FNP  nortriptyline (PAMELOR) 50 MG capsule Take 50 mg by mouth 2 (two) times daily.    [provider]  Omega-3 Fatty Acids (FISH OIL PO) Take 1 capsule by mouth daily.    [provider]  pantoprazole (PROTONIX) 40 MG tablet Take 1 tablet (40 mg total) by mouth daily. 02/21/20   Larey Dresser, MD  torsemide (DEMADEX) 20 MG tablet Take 4 tablets (80 mg total) by mouth every morning AND 3 tablets (60 mg total) every evening. 03/08/21   Larey Dresser, MD  VITAMIN D PO Take 1 tablet by mouth daily.    [provider]  tiotropium (SPIRIVA HANDIHALER) 18 MCG inhalation capsule Place 1 capsule (18 mcg total) into inhaler and inhale every morning. Patient not taking: Reported on 02/09/2021 12/09/19  02/09/21  Eugenie Filler, MD    Allergies    Gabapentin and Lyrica [pregabalin]  Review of Systems   Review of Systems  Constitutional:  Negative for fever.  HENT:  Positive for sneezing.   Respiratory:  Positive for cough. Negative for shortness of breath.   Cardiovascular:  Positive for chest pain and leg swelling.  Gastrointestinal:  Positive for vomiting. Negative for abdominal pain.  All other systems reviewed and are negative.  Physical Exam Updated Vital Signs BP (!) 148/67   Pulse 98   Temp 99.5 F (37.5 C) (Rectal)   Resp (!) 27   LMP  (LMP Unknown)   SpO2 92%   Physical Exam Vitals and nursing note reviewed.  Constitutional:      General: She is not in acute distress.    Appearance: She is well-developed. She is obese. She is not ill-appearing or diaphoretic.  HENT:  Head: Normocephalic and atraumatic.     Right Ear: External ear normal.     Left Ear: External ear normal.     Nose: Nose normal.  Eyes:     General:        Right eye: No discharge.        Left eye: No discharge.  Cardiovascular:     Rate and Rhythm: Normal rate and regular rhythm.     Heart sounds: Normal heart sounds.  Pulmonary:     Effort: Pulmonary effort is normal.     Breath sounds: Wheezing present.  Abdominal:     General: There is no distension.     Palpations: Abdomen is soft.     Tenderness: There is no abdominal tenderness.  Skin:    General: Skin is warm and dry.  Neurological:     Mental Status: She is alert.  Psychiatric:        Mood and Affect: Mood is not anxious.    ED Results / Procedures / Treatments   Labs (all labs ordered are listed, but only abnormal results are displayed) Labs Reviewed  COMPREHENSIVE METABOLIC PANEL - Abnormal; Notable for the following components:      Result Value   Glucose, Bld 218 (*)    Creatinine, Ser 1.49 (*)    Calcium 8.8 (*)    Albumin 2.9 (*)    Alkaline Phosphatase 132 (*)    GFR, Estimated 40 (*)    All other  components within normal limits  CBC WITH DIFFERENTIAL/PLATELET - Abnormal; Notable for the following components:   WBC 14.4 (*)    RBC 3.82 (*)    Hemoglobin 9.9 (*)    HCT 34.3 (*)    MCH 25.9 (*)    MCHC 28.9 (*)    RDW 16.7 (*)    Neutro Abs 12.1 (*)    Monocytes Absolute 1.1 (*)    Abs Immature Granulocytes 0.08 (*)    All other components within normal limits  URINALYSIS, ROUTINE W REFLEX MICROSCOPIC - Abnormal; Notable for the following components:   Glucose, UA >=500 (*)    Ketones, ur 5 (*)    Protein, ur 30 (*)    Bacteria, UA RARE (*)    All other components within normal limits  RESP PANEL BY RT-PCR (FLU A&B, COVID) ARPGX2  LIPASE, BLOOD  BRAIN NATRIURETIC PEPTIDE  TROPONIN I (HIGH SENSITIVITY)  TROPONIN I (HIGH SENSITIVITY)    EKG EKG Interpretation  Date/Time:  Thursday July 29 2021 12:52:28 EDT Ventricular Rate:  126 PR Interval:    QRS Duration: 98 QT Interval:  320 QTC Calculation: 464 R Axis:   11 Text Interpretation: Atrial fibrillation Ventricular premature complex Low voltage, precordial leads Borderline repolarization abnormality Interpretation limited secondary to artifact Confirmed by Sherwood Gambler [phone]) on 07/29/2021 12:54:25 PM  Radiology DG Chest 2 View  Result Date: 07/29/2021 CLINICAL DATA:  Cough and dyspnea EXAM: CHEST - 2 VIEW COMPARISON:  06/13/2021 FINDINGS: Atherosclerotic calcification of the aortic arch. CardioMEMS device projects over the left lower lobe pulmonary artery. The lungs appear otherwise clear and there is no current evidence of pulmonary edema. No blunting of the costophrenic angles. Heart size within normal limits. IMPRESSION: 1.  No active cardiopulmonary disease is radiographically apparent. Electronically Signed   By: Van Clines M.D.   On: 07/29/2021 14:08    Procedures Procedures   Medications Ordered in ED Medications  albuterol (VENTOLIN HFA) 108 (90 Base) MCG/ACT inhaler 2 puff (2 puffs  Inhalation Given 07/29/21 1357)    ED Course  I have reviewed the triage vital signs and the nursing notes.  Pertinent labs & imaging results that were available during my care of the patient were reviewed by me and considered in my medical decision making (see chart for details).    MDM Rules/Calculators/A&P                           Patient seems to be doing a little better after albuterol.  She is no longer wheezing.  She was in A. fib but it seems like this converted.  Otherwise, I suspect she has a viral illness and I am currently waiting on the BNP, COVID test, and will need to ambulate her to make sure she does not get hypoxic.  No obvious bacterial pneumonia.  Care transferred to Dr. Tyrone Nine. Final Clinical Impression(s) / ED Diagnoses Final diagnoses:  None    Rx / DC Orders ED Discharge Orders     None        Sherwood Gambler, MD 07/29/21 1512

## 2021-07-29 NOTE — ED Notes (Signed)
Ambulated on the hallway on continuous pulse O2. Pt maintained 88-93% on RA. Mild dyspnea on exertion noted. Pt assisted back to bed. Pt then fell asleep quickly as soon as she got situated on bed. Audible wheezing noted. Inhaler given to pt. Cardiac monitoring in place. Will continue to monitor.

## 2021-07-29 NOTE — ED Provider Notes (Signed)
InvertedI received the patient in signout from Dr. Regenia Skeeter.  Briefly the patient is a 60 year old female who was found to have a irregular heart rate found to be in A. fib with RVR spontaneously resolved before arrival here.  Has been suffering from upper respiratory symptoms.  Given some albuterol with improvement.  Plan for reassessment.  On reassessment the patient is having some continued tachypnea.  She was ambulated and became hypoxic not on oxygen at home.  We will give 3 duo nebs back-to-back Solu-Medrol and discuss with medicine.   Deno Etienne, DO 07/29/21 2236

## 2021-07-29 NOTE — ED Triage Notes (Signed)
EMS from home, called out by [employer] for eval of multiple complaints: nausea, indigestion, productive cough, shob, and general malaise. Pt in afib 120-160, with hx of same. Rhonchi and wheezing per EMS, hx of CHF. Takes Torsemide and reports compliance with same. Room air O2 sat 90%.

## 2021-07-29 NOTE — Progress Notes (Signed)
Paramedicine Encounter    Patient ID: [name], female    DOB: 1961-10-08, 60 y.o.   MRN: [id number]   Patient Care Team: [facility] [place] as PCP - General Gwenlyn Found, [name], MD as PCP - Cardiology (Cardiology) [name], MD as PCP - Electrophysiology (Cardiology) [name], MD as Consulting Physician (Cardiology) [name], MD as Consulting Physician (Pulmonary Disease) System, Provider Not In  Patient Active Problem List   Diagnosis Date Noted   Bradycardia 06/13/2021   Uncontrolled type 2 diabetes mellitus with hyperglycemia ([place]) 06/13/2021   Acute kidney injury superimposed on CKD ([place]) 06/13/2021   Critical ischemia of foot ([place]) 06/07/2021   Fall 05/05/2021   PAF (paroxysmal atrial fibrillation) ([place]) 05/05/2021   COPD (chronic obstructive pulmonary disease) ([place]) 05/05/2021   DM (diabetes mellitus), type 2 with complications ([place]) 99991111   PAD (peripheral artery disease) ([place]) 05/05/2021   Cellulitis 05/05/2021   Acute on chronic combined systolic and diastolic CHF (congestive heart failure) ([place]) 05/05/2021   OSA (obstructive sleep apnea) 05/05/2021   CKD (chronic kidney disease) stage 3, GFR 30-59 ml/min ([place]) 05/05/2021   AKI (acute kidney injury) ([place]) 05/04/2021   Pressure injury of skin 05/04/2021   Ulcer of foot, unspecified laterality, limited to breakdown of skin ([place]) 02/17/2021   Altered mental status 01/02/2021   AF (paroxysmal atrial fibrillation) ([place]) 01/02/2021   CAD (coronary artery disease) 01/02/2021   PVD (peripheral vascular disease) ([place]) 01/02/2021   Acute renal failure superimposed on stage 4 chronic kidney disease ([place]) 01/02/2021   Diabetes mellitus type 2, uncontrolled, with complications ([place]) [id number]   Diabetic nephropathy ([place]) 01/02/2021   Low back pain 06/01/2020   Hyperlipidemia 04/03/2020   Muscle weakness 03/20/2020   Closed fracture of neck of right radius 03/13/2020   Pain in elbow  03/12/2020   PAD (peripheral artery disease) ([place]) 12/26/2019   Acute renal failure ([name])    Acute respiratory failure with hypoxia ([place]) 12/02/2019   Acute exacerbation of CHF (congestive heart failure) ([place]) 12/01/2019   Cellulitis in diabetic foot ([place]) 07/08/2019   Osteomyelitis ([place]) 06/21/2019   NICM (nonischemic cardiomyopathy) ([place]) 06/20/2019   Non-healing ulcer ([place]) 06/20/2019   Acute CHF (congestive heart failure) ([place]) 11/26/2018   CHF (congestive heart failure) ([place]) 11/26/2018   Acute respiratory failure ([place]) 10/21/2018   AKI (acute kidney injury) ([place]) 08/18/2018   Coagulation disorder ([place]) 08/09/2017   Depression 07/21/2017   At risk for adverse drug reaction 06/20/2017   Peripheral neuropathy 06/20/2017   Acute osteomyelitis of right foot ([place]) 06/13/2017   S/P transmetatarsal amputation of foot, right ([place]) 06/05/2017   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 ([place]) 05/19/2017   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[place])    SIRS (systemic inflammatory response syndrome) ([place]) 04/06/2017   CKD (chronic kidney disease), stage III ([place]) 11/24/2016   Suspected sleep apnea 11/24/2016   Ulcer of toe of right foot, with necrosis of bone ([place]) 10/27/2016   Ulcer of left lower leg ([place]) 05/19/2016   Severe obesity (BMI >= 40) ([place]) 02/24/2016   COPD GOLD 0  02/24/2016   Morbid obesity ([place]) 02/24/2016   Encounter for therapeutic drug monitoring 02/10/2016   Symptomatic bradycardia 01/12/2016   Essential hypertension 12/22/2015   Chronic combined systolic and diastolic CHF (congestive heart failure) ([place])    [place]    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 11/08/2015   Tobacco abuse 10/23/2015   Coronary artery disease  DOE (dyspnea on exertion) 04/29/2015   Diabetes mellitus type 2, uncontrolled ([place]) 02/08/2015   Influenza A 02/07/2015   Wrist fracture, left, with routine healing, subsequent encounter 02/05/2015   Wrist fracture,  left, closed, initial encounter 01/29/2015   PAF (paroxysmal atrial fibrillation) ([place]) 01/16/2015   Carotid arterial disease ([place]) 01/16/2015   Claudication ([place]) 01/15/2015   Demand ischemia ([place]) 10/29/2014   Insomnia 02/03/2014   COPD with acute exacerbation ([place]) 02/01/2014   S/P peripheral artery angioplasty - TurboHawk atherectomy; R SFA 09/11/2013    Class: Acute   Leg pain, bilateral 08/19/2013   Hypothyroidism 07/31/2013   Cellulitis 06/13/2013   History of cocaine abuse ([place]) 06/13/2013   Long term current use of anticoagulant therapy 05/20/2013   Alcohol abuse    Narcotic abuse ([place])    Marijuana abuse    Alcoholic cirrhosis ([place])    DM (diabetes mellitus), type 2 with peripheral vascular complications ([name])     Current Outpatient Medications:    ACCU-CHEK GUIDE test strip, USE TO CHECK BLOOD SUGAR FOUR TIMES DAILY, Disp: , Rfl:    acetaminophen (TYLENOL) 500 MG tablet, Take 500 mg by mouth every 6 (six) hours as needed for moderate pain., Disp: , Rfl:    albuterol (VENTOLIN HFA) 108 (90 Base) MCG/ACT inhaler, Inhale 2 puffs into the lungs every 6 (six) hours as needed for wheezing or shortness of breath., Disp: 18 g, Rfl: 0   amiodarone (PACERONE) 200 MG tablet, Take 0.5 tablets (100 mg total) by mouth daily., Disp: 45 tablet, Rfl: 3   amLODipine (NORVASC) 10 MG tablet, Take 1 tablet (10 mg total) by mouth daily., Disp: 90 tablet, Rfl: 3   apixaban (ELIQUIS) 5 MG TABS tablet, Take 5 mg by mouth 2 (two) times daily., Disp: , Rfl:    atorvastatin (LIPITOR) 40 MG tablet, Take 40 mg by mouth every evening., Disp: , Rfl:    budesonide-formoterol (SYMBICORT) 160-4.5 MCG/ACT inhaler, Inhale 2 puffs into the lungs 2 (two) times daily., Disp: 1 Inhaler, Rfl: 2   clopidogrel (PLAVIX) 75 MG tablet, Take 1 tablet (75 mg total) by mouth daily with breakfast., Disp: 90 tablet, Rfl: 1   colchicine 0.6 MG tablet, Take 1 tablet (0.6 mg total) by mouth daily., Disp: 30 tablet, Rfl: 3    Continuous Blood Gluc Sensor (FREESTYLE LIBRE 2 SENSOR) MISC, USE 1 (ONE) EACH EVERY 2 WEEKS, Disp: 2 each, Rfl: 11   Dulaglutide (TRULICITY) A999333 [id number] SOPN, Inject 0.75 mg into the skin every Friday., Disp: 2 mL, Rfl: 6   empagliflozin (JARDIANCE) 10 MG TABS tablet, Take 1 tablet (10 mg total) by mouth daily before breakfast., Disp: 90 tablet, Rfl: 3   FLUoxetine (PROZAC) 20 MG capsule, Take 1 capsule (20 mg total) by mouth at bedtime., Disp: 90 capsule, Rfl: 1   folic acid (FOLVITE) 1 MG tablet, Take 1 tablet (1 mg total) by mouth daily., Disp: 90 tablet, Rfl: 1   hydrALAZINE (APRESOLINE) 25 MG tablet, Take 1 tablet (25 mg total) by mouth 3 (three) times daily., Disp: 90 tablet, Rfl: 11   insulin glargine (LANTUS) 100 UNIT/ML Solostar Pen, Inject 30 Units into the skin daily., Disp: 30 mL, Rfl: 6   insulin lispro (HUMALOG) 100 UNIT/ML KwikPen, Inject 8 Units into the skin 3 (three) times daily with meals., Disp: 15 mL, Rfl: 0   Insulin Pen Needle 32G X 4 MM MISC, USE AS DIRECTED WITH INSULIN PENS, Disp: 100 each, Rfl: 0   levothyroxine (SYNTHROID, LEVOTHROID) 50  MCG tablet, Take 1 tablet (50 mcg total) by mouth daily., Disp: 30 tablet, Rfl: 0   losartan (COZAAR) 100 MG tablet, Take 1 tablet (100 mg total) by mouth daily. (Patient taking differently: Take 100 mg by mouth at bedtime.), Disp: 30 tablet, Rfl: 6   nortriptyline (PAMELOR) 50 MG capsule, Take 50 mg by mouth 2 (two) times daily., Disp: , Rfl:    Omega-3 Fatty Acids (FISH OIL PO), Take 1 capsule by mouth daily., Disp: , Rfl:    pantoprazole (PROTONIX) 40 MG tablet, Take 1 tablet (40 mg total) by mouth daily., Disp:  , Rfl:    torsemide (DEMADEX) 20 MG tablet, Take 4 tablets (80 mg total) by mouth every morning AND 3 tablets (60 mg total) every evening., Disp: 210 tablet, Rfl: 11   VITAMIN D PO, Take 1 tablet by mouth daily., Disp: , Rfl:  Allergies  Allergen Reactions   Gabapentin Nausea And Vomiting and Other (See Comments)     POSSIBLE SHAKING   Lyrica [Pregabalin] Other (See Comments)    Shaking       Social History   Socioeconomic History   Marital status: Single    Spouse name: Not on file   Number of children: 1   Years of education: 12   Highest education level: 12th grade  Occupational History   Occupation: disabled  Tobacco Use   Smoking status: Every Day    Packs/day: 1.00    Years: 44.00    Pack years: 44.00    Types: E-cigarettes, Cigarettes   Smokeless tobacco: Former    Types: Snuff  Vaping Use   Vaping Use: Former   Devices: 11/26/2018 "stopped months ago"  Substance and Sexual Activity   Alcohol use: Yes    Comment: occ   Drug use: Yes    Types: "Crack" cocaine, Marijuana, Oxycodone   Sexual activity: Not Currently  Other Topics Concern   Not on file  Social History Narrative   ** Merged History Encounter **       Lives in [place], in motel with sister.  They are looking to move but don't have a place to go yet.     Social Determinants of Health   Financial Resource Strain: Low Risk    Difficulty of Paying Living Expenses: Not very hard  Food Insecurity: Not on file  Transportation Needs: Not on file  Physical Activity: Not on file  Stress: Not on file  Social Connections: Not on file  Intimate Partner Violence: Not on file    Physical Exam      Future Appointments  Date Time Provider [place]  08/03/2021  2:15 PM Ricard Dillon, MD [facility]  08/11/2021  2:10 PM Charlott Rakes, MD [facility] None  10/01/2021  2:30 PM [name], MD [facility]  10/21/2021  1:40 PM Larey Dresser, MD [facility] None  12/22/2021  1:00 PM [facility] 3 [facility]    BP 138/80   Pulse (!) 126   Resp 20   LMP  (LMP Unknown)   SpO2 95%   Weight yesterday-? Last visit weight-225  Pt reports not feeling well for the past 2-3 days. She reports intermittent n/v, no diarrhea, no bleeding issues, she has been able to eat some. +cough with  productive sputum, light yellow in color, she does feel warm to touch, indigestion at times, she denies c/p, some slight sob.  When getting v/s I noticed her HR was elevated in the 120s, placed  her on monitor, she appeared to be in Afib.  She was agreeable to be evaluated in ER.  EMS was called and care transferred over to the paramedics.  Will f/u early next week.   Marylouise Stacks, [facility] Paramedic  07/29/21

## 2021-07-29 NOTE — H&P (Signed)
History and Physical    ARKEISHA Price [id number] DOB: [birth year] DOA: 07/29/2021  PCP: [facility] [place]  Patient coming from: Home via EMS  I have personally briefly reviewed patient's old medical records in [place]  Chief Complaint: Shortness of breath  HPI: [name] is a 60 y.o. female with medical history significant for HFpEF (EF 55-60% 02/2021), PAF on Eliquis, history of symptomatic bradycardia, PAD s/p left femoropopliteal bypass and right transmetatarsal amputation, COPD, CKD stage IIIb, insulin-dependent T2DM, HTN, HLD, hypothyroidism, carotid artery stenosis s/p left CEA, anemia of chronic disease, depression/anxiety, tobacco use, and OSA not using CPAP who presented to the ED for evaluation of shortness of breath.  Patient states for the last 2 days she has been having URI symptoms with sinus congestion, increased frequency of cough from baseline (has chronic smoker's cough), with new white/yellow sputum production.  She has had intermittent nausea, vomiting, and loose stools but no watery diarrhea.  She has had associated shortness of breath.  She denies any subjective fevers, chills, diaphoresis, chest pain, palpitations, abdominal pain, or dysuria.  She denies any sick contacts.  She has not seen any obvious bleeding.  She reports ongoing tobacco use up to half a pack per day.  ED Course:  Initial vitals showed BP 125/77, pulse 130, RR 18, temp 99.9 F, SPO2 100% on 2 L supplemental O2 via [place].  Labs show sodium 138, potassium 4.4, bicarb 25, BUN 19, creatinine 1.49, serum glucose 218, WBC 14.4, hemoglobin 9.9, platelets 343,000, BNP 285.1, lipase 27, high-sensitivity troponin 17 > 21.  Urinalysis shows negative nitrates, negative leukocytes, 0-5 RBCs and WBCs per hpf, rare bacteria microscopy.  SARS-CoV-2 and influenza PCR's are negative.  2 view chest x-ray is negative for focal consolidation, edema, or effusion.  CardioMEMS device in place.  Patient  was given albuterol inhaler treatment.  Patient was ambulated in the ED and noted to have SPO2 88-93% while on room air.  Patient was ordered to receive IV Solu-Medrol 125 mg and DuoNeb treatments.  The hospitalist service was consulted to admit for further evaluation and management.  Review of Systems: All systems reviewed and are negative except as documented in history of present illness above.   Past Medical History:  Diagnosis Date   Alcohol abuse    Alcoholic cirrhosis ([place])    Anemia    Anxiety    Arthritis    "knees" (11/26/2018)   B12 deficiency    CAD (coronary artery disease)    a. 11/10/2014 Cath: LM nl, LAD min irregs, D1 30 ost, D2 50d, LCX 99m OM1 80 p/m (1.5 mm vessel), OM2 432mRCA nondom 9074mmed rx.. Demand ischemia in the setting of rapid a-fib.   Cardiomyopathy ([facility]  Carotid artery disease ([facility]  a. 01/2015 Carotid Angio: RICA [id number]. [id number] L CEA; Price. 05/2019 Carotid U/S: RICA 100. RECA >50. LICA [id number] Cellulitis    lower extremities   CHF (congestive heart failure) ([name])    Chronic combined systolic and diastolic CHF (congestive heart failure) ([place]  a. 10/2014 Echo: EF 40-45%; Price. 10/2018 Echo: EF 45-50%, gr2 DD; Price. 11/2019 Echo: EF 50%, mild LVH, gr2 DD (restrictive), antlat HK, Nl RV fxn. Mild BAE. RVSP 59.9mm26m   CKD (chronic kidney disease), stage III ([name])    Cocaine abuse ([name])[place] COPD (chronic obstructive pulmonary disease) ([name])[place] Critical lower limb ischemia ([name])[place]  Depression    Diabetes mellitus without complication ([place])    Diabetic peripheral neuropathy ([name])    DVT (deep venous thrombosis) ([name])    Dyspnea    Elevated troponin    a. Chronic elevation.   GERD (gastroesophageal reflux disease)    Hyperlipemia    Hypertension    Hypokalemia    Hypomagnesemia    Hypothyroidism    Marijuana abuse    Narcotic abuse ([place])    Noncompliance    NSVT (nonsustained ventricular tachycardia) ([name])    Obesity    PAF  (paroxysmal atrial fibrillation) ([name])    Paroxysmal atrial tachycardia ([name])    Peripheral arterial disease ([place])    a. 01/2015 Angio/PTA: RSFA 99 (atherectomy/pta) - 1 vessel runoff via diff dzs peroneal; Price. 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   Pneumonia    "once or twice" (11/26/2018)   Poorly controlled type 2 diabetes mellitus ([place])    Renal disorder    Renal insufficiency    a. 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   Sleep apnea    "couldn't handle wearing the mask" (11/26/2018)   Symptomatic bradycardia    a. Avoid AV blocking agent per EP. Prev req temp wire in 2017.   Tobacco abuse     Past Surgical History:  Procedure Laterality Date   ABDOMINAL AORTOGRAM N/A 06/26/2019   Procedure: ABDOMINAL AORTOGRAM;  Surgeon: Wellington Hampshire, Karla;  Location: [place] [facility];  Service: Cardiovascular;  Laterality: N/A;   ABDOMINAL AORTOGRAM W/LOWER EXTREMITY N/A 06/07/2021   Procedure: ABDOMINAL AORTOGRAM W/LOWER EXTREMITY;  Surgeon: [name], Karla;  Location: [place] [facility];  Service: Cardiovascular;  Laterality: N/A;   AMPUTATION Right 06/14/2017   Procedure: Right foot transmetatarsal amputation;  Surgeon: Newt Minion, Karla;  Location: [place];  Service: Orthopedics;  Laterality: Right;   AMPUTATION Left 06/28/2019   Procedure: AMPUTATION LEFT FIFTH TOE;  Surgeon: Rosetta Posner, Karla;  Location: [place];  Service: Vascular;  Laterality: Left;   AMPUTATION TOE Right 04/28/2017   Procedure: AMPUTATION OF RIGHT SECOND RAY;  Surgeon: Newt Minion, Karla;  Location: [place];  Service: Orthopedics;  Laterality: Right;   CARDIAC CATHETERIZATION     CARDIAC CATHETERIZATION N/A 01/12/2016   Procedure: Temporary Wire;  Surgeon: Minus Breeding, Karla;  Location: [place] [facility];  Service: Cardiovascular;  Laterality: N/A;   CARDIOVERSION  ~ 02/2013   "twice"    CAROTID ANGIOGRAM N/A 01/15/2015   Procedure: CAROTID ANGIOGRAM;  Surgeon: [name], Karla;  Location: [facility] CATH LAB;   Service: Cardiovascular;  Laterality: N/A;   DILATION AND CURETTAGE OF UTERUS  1988   ENDARTERECTOMY Left 02/19/2015   Procedure: LEFT CAROTID ENDARTERECTOMY ;  Surgeon: Serafina Mitchell, Karla;  Location: [place];  Service: Vascular;  Laterality: Left;   FEMORAL-TIBIAL BYPASS GRAFT Left 06/28/2019   Procedure: BYPASS GRAFT LEFT LEG FEMORAL TO ANTERIOR TIBIAL ARTERY using LEFT GREATER SAPHENOUS VEIN;  Surgeon: Rosetta Posner, Karla;  Location: [place];  Service: Vascular;  Laterality: Left;   LEFT HEART CATHETERIZATION WITH CORONARY ANGIOGRAM N/A 10/31/2014   Procedure: LEFT HEART CATHETERIZATION WITH CORONARY ANGIOGRAM;  Surgeon: Burnell Blanks, Karla;  Location[facility] LAB;  Service: Cardiovascular;  Laterality: N/A;   LOWER EXTREMITY ANGIOGRAM N/A 09/10/2013   Procedure: LOWER EXTREMITY ANGIOGRAM;  Surgeon: [name], Karla;  Location: [facility] CATH LAB;  Service: Cardiovascular;  Laterality: N/A;   LOWER EXTREMITY ANGIOGRAM  N/A 01/15/2015   Procedure: LOWER EXTREMITY ANGIOGRAM;  Surgeon: [name], Karla;  Location: [facility] CATH LAB;  Service: Cardiovascular;  Laterality: N/A;   LOWER EXTREMITY ANGIOGRAPHY N/A 04/13/2017   Procedure: Lower Extremity Angiography;  Surgeon: [name], Karla;  Location: [place] [facility];  Service: Cardiovascular;  Laterality: N/A;   LOWER EXTREMITY ANGIOGRAPHY Left 06/26/2019   Procedure: LOWER EXTREMITY ANGIOGRAPHY;  Surgeon: Wellington Hampshire, Karla;  Location: [place] [facility];  Service: Cardiovascular;  Laterality: Left;   PERIPHERAL VASCULAR ATHERECTOMY Right 06/07/2021   Procedure: PERIPHERAL VASCULAR ATHERECTOMY;  Surgeon: [name], Karla;  Location: [place] [facility];  Service: Cardiovascular;  Laterality: Right;   PERIPHERAL VASCULAR BALLOON ANGIOPLASTY Left 06/26/2019   Procedure: PERIPHERAL VASCULAR BALLOON ANGIOPLASTY;  Surgeon: Wellington Hampshire, Karla;  Location: [place] [facility] LAB;  Service: Cardiovascular;  Laterality: Left;  unable to cross lt sfa  occlusion   PERIPHERAL VASCULAR INTERVENTION  04/13/2017   Procedure: Peripheral Vascular Intervention;  Surgeon: [name], Karla;  Location: [place] [facility];  Service: Cardiovascular;;   PRESSURE SENSOR/CARDIOMEMS N/A 02/05/2020   Procedure: PRESSURE SENSOR/CARDIOMEMS;  Surgeon: Larey Dresser, Karla;  Location: [place] [facility] LAB;  Service: Cardiovascular;  Laterality: N/A;   VEIN HARVEST Left 06/28/2019   Procedure: LEFT LEG GREATER SAPHENOUS VEIN HARVEST;  Surgeon: Rosetta Posner, Karla;  Location[facility];  Service: Vascular;  Laterality: Left;    Social History:  reports that she has been smoking e-cigarettes and cigarettes. She has a 44.00 pack-year smoking history. She has quit using smokeless tobacco.  Her smokeless tobacco use included snuff. She reports current alcohol use. She reports current drug use. Drugs: "Crack" cocaine, Marijuana, and Oxycodone.  Allergies  Allergen Reactions   Gabapentin Nausea And Vomiting and Other (See Comments)    POSSIBLE SHAKING   Lyrica [Pregabalin] Other (See Comments)    Shaking     Family History  Problem Relation Age of Onset   Hypertension Mother    Diabetes Mother    Cancer Mother        breast, ovarian, colon   Clotting disorder Mother    Heart disease Mother    Heart attack Mother    Breast cancer Mother        in 4's   Hypertension Father    Heart disease Father    Emphysema Sister        smoked     Prior to Admission medications   Medication Sig Start Date End Date Taking? Authorizing Provider  ACCU-CHEK GUIDE test strip USE TO CHECK BLOOD SUGAR FOUR TIMES DAILY 02/25/20   Provider, Historical, Karla  acetaminophen (TYLENOL) 500 MG tablet Take 500 mg by mouth every 6 (six) hours as needed for moderate pain.    Provider, Historical, Karla  albuterol (VENTOLIN HFA) 108 (90 Base) MCG/ACT inhaler Inhale 2 puffs into the lungs every 6 (six) hours as needed for wheezing or shortness of breath. 07/08/19   Karla Milch Price, Karla   amiodarone (PACERONE) 200 MG tablet Take 0.5 tablets (100 mg total) by mouth daily. 10/15/20   Larey Dresser, Karla  amLODipine (NORVASC) 10 MG tablet Take 1 tablet (10 mg total) by mouth daily. 10/15/20   Larey Dresser, Karla  apixaban (ELIQUIS) 5 MG TABS tablet Take 5 mg by mouth 2 (two) times daily.    Provider, Historical, Karla  atorvastatin (LIPITOR) 40 MG tablet Take 40 mg by mouth every evening.  Provider, Historical, Karla  budesonide-formoterol (SYMBICORT) 160-4.5 MCG/ACT inhaler Inhale 2 puffs into the lungs 2 (two) times daily. 12/09/19   Eugenie Filler, Karla  clopidogrel (PLAVIX) 75 MG tablet Take 1 tablet (75 mg total) by mouth daily with breakfast. 06/09/21   Reino Bellis Price, Karla  colchicine 0.6 MG tablet Take 1 tablet (0.6 mg total) by mouth daily. 07/22/21   Charlott Rakes, Karla  Continuous Blood Gluc Sensor (FREESTYLE LIBRE 2 SENSOR) MISC USE 1 (ONE) EACH EVERY 2 WEEKS 07/14/21   Charlott Rakes, Karla  Dulaglutide (TRULICITY) A999333 [id number] SOPN Inject 0.75 mg into the skin every Friday. 07/16/21   Charlott Rakes, Karla  empagliflozin (JARDIANCE) 10 MG TABS tablet Take 1 tablet (10 mg total) by mouth daily before breakfast. 04/01/21   Price, [name] Price, Karla  FLUoxetine (PROZAC) 20 MG capsule Take 1 capsule (20 mg total) by mouth at bedtime. 07/14/21   Charlott Rakes, Karla  folic acid (FOLVITE) 1 MG tablet Take 1 tablet (1 mg total) by mouth daily. 07/14/21   Charlott Rakes, Karla  hydrALAZINE (APRESOLINE) 25 MG tablet Take 1 tablet (25 mg total) by mouth 3 (three) times daily. 07/23/21   Larey Dresser, Karla  insulin glargine (LANTUS) 100 UNIT/ML Solostar Pen Inject 30 Units into the skin daily. 07/14/21 07/14/22  Charlott Rakes, Karla  insulin lispro (HUMALOG) 100 UNIT/ML KwikPen Inject 8 Units into the skin 3 (three) times daily with meals. 01/05/21   Allie Bossier, Karla  Insulin Pen Needle 32G X 4 MM MISC USE AS DIRECTED WITH INSULIN PENS 01/05/21 01/05/22  Allie Bossier, Karla  levothyroxine (SYNTHROID,  LEVOTHROID) 50 MCG tablet Take 1 tablet (50 mcg total) by mouth daily. 08/09/17   Price, [name], Karla  losartan (COZAAR) 100 MG tablet Take 1 tablet (100 mg total) by mouth daily. Patient taking differently: Take 100 mg by mouth at bedtime. 06/16/21   Milford, Maricela Bo, FNP  nortriptyline (PAMELOR) 50 MG capsule Take 50 mg by mouth 2 (two) times daily.    Provider, Historical, Karla  Omega-3 Fatty Acids (FISH OIL PO) Take 1 capsule by mouth daily.    Provider, Historical, Karla  pantoprazole (PROTONIX) 40 MG tablet Take 1 tablet (40 mg total) by mouth daily. 02/21/20   Larey Dresser, Karla  torsemide (DEMADEX) 20 MG tablet Take 4 tablets (80 mg total) by mouth every morning AND 3 tablets (60 mg total) every evening. 03/08/21   Larey Dresser, Karla  VITAMIN Price PO Take 1 tablet by mouth daily.    Provider, Historical, Karla  tiotropium (SPIRIVA HANDIHALER) 18 MCG inhalation capsule Place 1 capsule (18 mcg total) into inhaler and inhale every morning. Patient not taking: Reported on 02/09/2021 12/09/19 02/09/21  Eugenie Filler, Karla    Physical Exam: Vitals:   07/29/21 1700 07/29/21 1730 07/29/21 1757 07/29/21 1800  BP: (!) 163/68 (!) 123/93  (!) 172/73  Pulse: 97 96  95  Resp: (!) 24 (!) 24  (!) 22  Temp:   99.1 F (37.3 Price)   TempSrc:   Oral   SpO2: 94% 94%  100%   Constitutional: Obese woman resting in bed with head elevated, NAD, calm, comfortable Eyes: PERRL, lids and conjunctivae normal ENMT: Mucous membranes are moist. Posterior pharynx clear of any exudate or lesions.edentulous. Neck: normal, supple, no masses. Respiratory: Distant breath sounds with faint end expiratory wheezing.  Normal respiratory effort while on 2 L supplemental O2 via [place]. No accessory muscle use.  Cardiovascular:  Regular rate and rhythm, no murmurs / rubs / gallops.  +1 bilateral lower extremity edema. 2+ pedal pulses. Abdomen: no tenderness, no masses palpated. No hepatosplenomegaly. Bowel sounds positive.   Musculoskeletal: no clubbing / cyanosis. No joint deformity upper and lower extremities. Good ROM, no contractures. Normal muscle tone.  Skin: no rashes, lesions, ulcers. No induration Neurologic: CN 2-12 grossly intact. Sensation intact. Strength 5/5 in all 4.  Psychiatric: Normal judgment and insight. Alert and oriented x 3. Normal mood.   Labs on Admission: I have personally reviewed following labs and imaging studies  CBC: Recent Labs  Lab 07/29/21 1309  WBC 14.4*  NEUTROABS 12.1*  HGB 9.9*  HCT 34.3*  MCV 89.8  PLT [id number]   Basic Metabolic Panel: Recent Labs  Lab 07/27/21 1555 07/29/21 1309  NA 139 138  K 4.0 4.4  CL 98 103  CO2 30 25  GLUCOSE 192* 218*  BUN 21* 19  CREATININE 1.37* 1.49*  CALCIUM 9.1 8.8*   GFR: Estimated Creatinine Clearance: 47.9 mL/min (A) (by Price-G formula based on SCr of 1.49 mg/dL (H)). Liver Function Tests: Recent Labs  Lab 07/29/21 1309  AST 21  ALT 20  ALKPHOS 132*  BILITOT 0.6  PROT 6.7  ALBUMIN 2.9*   Recent Labs  Lab 07/29/21 1309  LIPASE 27   No results for input(s): AMMONIA in the last 168 hours. Coagulation Profile: No results for input(s): INR, PROTIME in the last 168 hours. Cardiac Enzymes: No results for input(s): CKTOTAL, CKMB, CKMBINDEX, TROPONINI in the last 168 hours. BNP (last 3 results) No results for input(s): PROBNP in the last 8760 hours. HbA1C: No results for input(s): HGBA1C in the last 72 hours. CBG: No results for input(s): GLUCAP in the last 168 hours. Lipid Profile: No results for input(s): CHOL, HDL, LDLCALC, TRIG, CHOLHDL, LDLDIRECT in the last 72 hours. Thyroid Function Tests: No results for input(s): TSH, T4TOTAL, FREET4, T3FREE, THYROIDAB in the last 72 hours. Anemia Panel: No results for input(s): VITAMINB12, FOLATE, FERRITIN, TIBC, IRON, RETICCTPCT in the last 72 hours. Urine analysis:    Component Value Date/Time   COLORURINE YELLOW 07/29/2021 [place] 07/29/2021 1418    LABSPEC 1.015 07/29/2021 1418   PHURINE 8.0 07/29/2021 1418   GLUCOSEU >=500 (A) 07/29/2021 1418   HGBUR NEGATIVE 07/29/2021 1418   BILIRUBINUR NEGATIVE 07/29/2021 1418   KETONESUR 5 (A) 07/29/2021 1418   PROTEINUR 30 (A) 07/29/2021 1418   UROBILINOGEN 0.2 09/05/2015 0012   NITRITE NEGATIVE 07/29/2021 1418   LEUKOCYTESUR NEGATIVE 07/29/2021 1418    Radiological Exams on Admission: DG Chest 2 View  Result Date: 07/29/2021 CLINICAL DATA:  Cough and dyspnea EXAM: CHEST - 2 VIEW COMPARISON:  06/13/2021 FINDINGS: Atherosclerotic calcification of the aortic arch. CardioMEMS device projects over the left lower lobe pulmonary artery. The lungs appear otherwise clear and there is no current evidence of pulmonary edema. No blunting of the costophrenic angles. Heart size within normal limits. IMPRESSION: 1.  No active cardiopulmonary disease is radiographically apparent. Electronically Signed   By: Van Clines M.Price.   On: 07/29/2021 14:08    EKG: Personally reviewed. Atrial fibrillation, rate 126, PVC present, motion artifact.  Previous EKG showed normal sinus rhythm.  Assessment/Plan Principal Problem:   Acute respiratory failure with hypoxia ([name]) Active Problems:   DM (diabetes mellitus), type 2 with peripheral vascular complications ([name])   Hypothyroidism   COPD with acute exacerbation ([name])   Coronary artery disease   Tobacco abuse  Hypertension associated with diabetes ([place])   CKD (chronic kidney disease), stage III ([name])   PAD (peripheral artery disease) ([name])   PAF (paroxysmal atrial fibrillation) ([place])   Hyperlipidemia associated with type 2 diabetes mellitus ([place])   REMEDIOS POLICH is a 60 y.o. female with medical history significant for HFpEF (EF 55-60% 02/2021), PAF on Eliquis, history of symptomatic bradycardia, PAD s/p left femoropopliteal bypass and right transmetatarsal amputation, COPD, CKD stage IIIb, insulin-dependent T2DM, HTN, HLD, hypothyroidism, carotid artery  stenosis s/p left CEA, anemia of chronic disease, depression/anxiety, tobacco use, and OSA not using CPAP who is admitted with acute respiratory failure with hypoxia due to COPD exacerbation.  Acute respiratory failure with hypoxia due to COPD exacerbation: SPO2 89-93% on room air with ambulation.  Does not use supplemental oxygen at home.  Currently saturating well on 2 L O2 via [name].  Likely triggered by URI.  COVID and influenza negative.  CXR without evidence of pneumonia or pulmonary edema. -Continue IV Solu-Medrol 40 mg twice daily -Continue scheduled DuoNebs with as needed albuterol -Start oral azithromycin -Continue supplemental oxygen and wean as able  Paroxysmal atrial fibrillation with RVR: In A. fib with RVR on arrival, spontaneously converted while in the ED.  Continue Eliquis and amiodarone.  Not on rate controlling medication due to history of symptomatic bradycardia.  Leukocytosis: WBC 14.4.  No clear bacterial infection, suspect viral URI as above.  Started on azithromycin for COPD management.  Continue to monitor, may remain elevated in setting of steroid use.  HFpEF: Last EF 55-60% by TTE 02/2021.  Currently appears compensated without evidence of pulmonary edema.  Continue home torsemide 80 mg a.m. and 60 mg p.m. Continue Jardiance.  Monitor strict I/O's and daily weights.  CKD stage IIIb: Chronic and appears stable.  Continue to monitor.  CAD/PAD/CAS/HLD: Chronic issues which all appear stable.  Continue Eliquis, Plavix (s/p right SFA CTO revascularization with drug-coated balloon angioplasty 06/07/2021), continue atorvastatin.  Anemia of chronic disease: Stable without obvious bleeding.  Continue to monitor.  Insulin-dependent type 2 diabetes: Continue home Lantus 30 units daily, Humalog 8 units TIDAC, SSI, Jardiance.  Hypertension: Continue amlodipine, hydralazine, torsemide.  Hold losartan, appears this was discontinued previously due to recent AKI.  Will  discontinue from med list.  Hypothyroidism: Continue Synthroid.  Depression/anxiety: Continue Prozac and nortriptyline.  OSA: Not using CPAP.  Continue supplemental oxygen as needed.  Tobacco use: Reports smoking half pack per day.  Declines nicotine patch.  Smoking cessation advised.  DVT prophylaxis: Eliquis Code Status: DNR, confirmed with patient on admission Family Communication: Discussed with patient, she has discussed with family Disposition Plan: From home and likely discharge to home pending clinical progress Consults called: None Level of care: Telemetry Cardiac Admission status:  Status is: Observation  The patient remains OBS appropriate and will Price/Price before 2 midnights.  Dispo: The patient is from: Home              Anticipated Price/Price is to: Home              Patient currently is not medically stable to Price/Price.   Difficult to place patient No  Zada Finders Karla [facility]  If 7PM-7AM, please contact night-coverage [email or web address]  07/29/2021, 7:14 PM

## 2021-07-30 ENCOUNTER — Other Ambulatory Visit (HOSPITAL_COMMUNITY): Payer: Self-pay

## 2021-07-30 DIAGNOSIS — J9601 Acute respiratory failure with hypoxia: Secondary | ICD-10-CM | POA: Diagnosis not present

## 2021-07-30 LAB — CBC
HCT: 35.1 % — ABNORMAL LOW (ref 36.0–46.0)
Hemoglobin: 10.1 g/dL — ABNORMAL LOW (ref 12.0–15.0)
MCH: 25.9 pg — ABNORMAL LOW (ref 26.0–34.0)
MCHC: 28.8 g/dL — ABNORMAL LOW (ref 30.0–36.0)
MCV: 90 fL (ref 80.0–100.0)
Platelets: 334 10*3/uL (ref 150–400)
RBC: 3.9 MIL/uL (ref 3.87–5.11)
RDW: 16.3 % — ABNORMAL HIGH (ref 11.5–15.5)
WBC: 10.4 10*3/uL (ref 4.0–10.5)
nRBC: 0 % (ref 0.0–0.2)

## 2021-07-30 LAB — BASIC METABOLIC PANEL
Anion gap: 14 (ref 5–15)
BUN: 21 mg/dL — ABNORMAL HIGH (ref 6–20)
CO2: 27 mmol/L (ref 22–32)
Calcium: 9.2 mg/dL (ref 8.9–10.3)
Chloride: 98 mmol/L (ref 98–111)
Creatinine, Ser: 1.32 mg/dL — ABNORMAL HIGH (ref 0.44–1.00)
GFR, Estimated: 47 mL/min — ABNORMAL LOW (ref >60)
Glucose, Bld: 266 mg/dL — ABNORMAL HIGH (ref 70–99)
Potassium: 4.5 mmol/L (ref 3.5–5.1)
Sodium: 139 mmol/L (ref 135–145)

## 2021-07-30 LAB — CBG MONITORING, ED
Glucose-Capillary: 257 mg/dL — ABNORMAL HIGH (ref 70–99)
Glucose-Capillary: 284 mg/dL — ABNORMAL HIGH (ref 70–99)

## 2021-07-30 MED ORDER — AZITHROMYCIN 250 MG PO TABS
250.0000 mg | ORAL_TABLET | Freq: Every day | ORAL | 0 refills | Status: DC
  Filled 2021-07-30: qty 4, 4d supply, fill #0

## 2021-07-30 MED ORDER — DOXYCYCLINE HYCLATE 100 MG PO TABS
100.0000 mg | ORAL_TABLET | Freq: Two times a day (BID) | ORAL | 0 refills | Status: DC
Start: 2021-07-30 — End: 2021-08-11
  Filled 2021-07-30: qty 12, 6d supply, fill #0

## 2021-07-30 MED ORDER — IPRATROPIUM-ALBUTEROL 0.5-2.5 (3) MG/3ML IN SOLN
3.0000 mL | Freq: Four times a day (QID) | RESPIRATORY_TRACT | Status: DC
  Administered 2021-07-30: 3 mL via RESPIRATORY_TRACT
  Filled 2021-07-30: qty 3

## 2021-07-30 MED ORDER — ALBUTEROL SULFATE HFA 108 (90 BASE) MCG/ACT IN AERS
2.0000 | INHALATION_SPRAY | Freq: Four times a day (QID) | RESPIRATORY_TRACT | 3 refills | Status: DC | PRN
  Filled 2021-07-30: qty 18, 25d supply, fill #0

## 2021-07-30 MED ORDER — ALBUTEROL SULFATE (2.5 MG/3ML) 0.083% IN NEBU: 2.5000 mg | INHALATION_SOLUTION | RESPIRATORY_TRACT | Status: DC | PRN

## 2021-07-30 MED ORDER — PREDNISONE 20 MG PO TABS
40.0000 mg | ORAL_TABLET | Freq: Every day | ORAL | 0 refills | Status: DC
  Filled 2021-07-30: qty 8, 4d supply, fill #0

## 2021-07-30 NOTE — ED Notes (Signed)
Clarified order with Dr.Danford at this time as pt has 2 insulin orders achs. Pt has insulin sliding scale and has insulin novolog 8 units achs. Waiting for response.

## 2021-07-30 NOTE — ED Notes (Signed)
Walked patient to the bathroom patient did well her oxygen level dropped down to 84 room air then went back up to 100 room air

## 2021-07-30 NOTE — Discharge Summary (Signed)
Physician Discharge Summary  [name] [id number] DOB: May 28, 1961 DOA: 07/29/2021  PCP: [facility]  Admit date: 07/29/2021 Discharge date: 07/30/2021  Admitted From: Home  Disposition:  Home   Recommendations for Outpatient Follow-up:  Follow up with [facility] PCP in 1 week Call Pulmonology to arrange follow up as directed by them      Home Health: None  Equipment/Devices: None new  Discharge Condition: Fair  CODE STATUS: FULL Diet recommendation: Regular  Brief/Interim Summary: [name] is a 60 y.o. F with MO, dCHF, pAF on Eliquis, PAD s/p L fempop bypass, L CEA and R transmet amputation, COPD not on home O2, CKD IIIb, DM, HTN, anemia, smoking, and hypothyroidism who presented with shortness of breath for 2 days as well as sinus congestion, productive cough.  In the ER, chest x-ray clear, tachycardic, COVID-negative.  She was given steroids and bronchodilators, and she desaturated transiently to 88% with ambulation so she was observed overnight.       PRINCIPAL HOSPITAL DIAGNOSIS: COPD exacerbation    Discharge Diagnoses:  COPD exacerbation Baseline FEV1 76%, now on oxygen.  Here she was treated with steroids, azithromycin, bronchodilators and this morning she felt improved.  She ambulated with nursing, felt tired, but close to her baseline, and was weaned to room air at rest.  Acute hypoxic respiratory failure ruled out, she was only transiently hypoxic with exertion.  Discharged to complete 5 days prednisone, 7 days antibiotics (with doxycycline), and her bronchodilators were refilled.      Chronic diastolic CHF  Paroxysmal atrial fibrillation on Eliquis  Peripheral vascular disease  CKD stage IIIb  Type 2 diabetes associated with obesity  Essential hypertension  Anemia of chronic disease (COPD)  Smoking Cessation recommended, modalities discussed  Sleep apnea not on CPAP  Morbid obesity BMI  >40      Discharge Instructions  Discharge Instructions     Discharge instructions   Complete by: As directed    From Dr. Nelva Bush were admitted for a COPD flare. You should continue steroids and antibiotics  Take prednisone 40 mg daily for 4 more days (two tabs every morning, this is the steroid)  Take doxycycline 100 mg twice daily for 6 more days (this is the antibiotic)(start it tonight)   I sent refills of your albuterol Use albuterol at least three times per day for the next week, two puffs, morning noon and night  Then go back to using as needed  Stop smoking   Go see your PCP in 1 week  Take it easy   Increase activity slowly   Complete by: As directed       Allergies as of 07/30/2021       Reactions   Gabapentin Nausea And Vomiting, Other (See Comments)   POSSIBLE SHAKING   Lyrica [pregabalin] Other (See Comments)   Shaking        Medication List     TAKE these medications    Accu-Chek Guide test strip Generic drug: glucose blood USE TO CHECK BLOOD SUGAR FOUR TIMES DAILY   acetaminophen 500 MG tablet Commonly known as: TYLENOL Take 500 mg by mouth every 6 (six) hours as needed for moderate pain.   albuterol 108 (90 Base) MCG/ACT inhaler Commonly known as: VENTOLIN HFA Inhale 2 puffs into the lungs every 6 (six) hours as needed for wheezing or shortness of breath.   amiodarone 200 MG tablet Commonly known as: PACERONE Take 0.5 tablets (100 mg total) by mouth daily.  amLODipine 10 MG tablet Commonly known as: NORVASC Take 1 tablet (10 mg total) by mouth daily.   atorvastatin 40 MG tablet Commonly known as: LIPITOR Take 40 mg by mouth every evening.   budesonide-formoterol 160-4.5 MCG/ACT inhaler Commonly known as: Symbicort Inhale 2 puffs into the lungs 2 (two) times daily.   clopidogrel 75 MG tablet Commonly known as: PLAVIX Take 1 tablet (75 mg total) by mouth daily with breakfast.   colchicine 0.6 MG tablet Take 1 tablet  (0.6 mg total) by mouth daily.   doxycycline 100 MG capsule Commonly known as: VIBRAMYCIN Take 1 capsule (100 mg total) by mouth 2 (two) times daily.   Eliquis 5 MG Tabs tablet Generic drug: apixaban Take 5 mg by mouth 2 (two) times daily.   empagliflozin 10 MG Tabs tablet Commonly known as: Jardiance Take 1 tablet (10 mg total) by mouth daily before breakfast.   FISH OIL PO Take 1 capsule by mouth daily.   FLUoxetine 20 MG capsule Commonly known as: PROZAC Take 1 capsule (20 mg total) by mouth at bedtime.   folic acid 1 MG tablet Commonly known as: FOLVITE Take 1 tablet (1 mg total) by mouth daily.   FreeStyle Libre 2 Sensor Misc USE 1 (ONE) EACH EVERY 2 WEEKS   hydrALAZINE 25 MG tablet Commonly known as: APRESOLINE Take 1 tablet (25 mg total) by mouth 3 (three) times daily.   insulin glargine 100 UNIT/ML Solostar Pen Commonly known as: LANTUS Inject 30 Units into the skin daily.   insulin lispro 100 UNIT/ML KwikPen Commonly known as: HUMALOG Inject 8 Units into the skin 3 (three) times daily with meals.   levothyroxine 50 MCG tablet Commonly known as: SYNTHROID Take 1 tablet (50 mcg total) by mouth daily.   nortriptyline 50 MG capsule Commonly known as: PAMELOR Take 50 mg by mouth 2 (two) times daily.   pantoprazole 40 MG tablet Commonly known as: PROTONIX Take 1 tablet (40 mg total) by mouth daily.   PenTips 32G X 4 MM Misc Generic drug: Insulin Pen Needle USE AS DIRECTED WITH INSULIN PENS   predniSONE 20 MG tablet Commonly known as: DELTASONE Take 2 tablets (40 mg total) by mouth daily with breakfast.   torsemide 20 MG tablet Commonly known as: DEMADEX Take 60-80 mg by mouth See admin instructions. Take 4 tablets by mouth in the morning, then take 3 tablets by mouth in the afternoon per patient   Trulicity [id number] Sopn Generic drug: Dulaglutide Inject 0.75 mg into the skin every Friday.   VITAMIN D PO Take 1 tablet by mouth daily.         Follow-up [place]. Schedule an appointment as soon as possible for a visit in 1 week(s).   Contact information: [place] [address] [phone]         [name], MD. Call.   Specialty: Pulmonary Disease Contact information: [address] [place] [address] [phone]                Allergies  Allergen Reactions   Gabapentin Nausea And Vomiting and Other (See Comments)    POSSIBLE SHAKING   Lyrica [Pregabalin] Other (See Comments)    Shaking        Procedures/Studies: DG Chest 2 View  Result Date: 07/29/2021 CLINICAL DATA:  Cough and dyspnea EXAM: CHEST - 2 VIEW COMPARISON:  06/13/2021 FINDINGS: Atherosclerotic calcification of the aortic arch. CardioMEMS device  projects over the left lower lobe pulmonary artery. The lungs appear otherwise clear and there is no current evidence of pulmonary edema. No blunting of the costophrenic angles. Heart size within normal limits. IMPRESSION: 1.  No active cardiopulmonary disease is radiographically apparent. Electronically Signed   By: Van Clines M.D.   On: 07/29/2021 14:08      Subjective: Still coughing occasionally, but no headache, confusion, respiratory distress.  Whitish sputum, improving yesterday.  Discharge Exam: Vitals:   07/30/21 1015 07/30/21 1035  BP: (!) 153/62 (!) 141/68  Pulse: 95 100  Resp: 18 20  Temp:  98 F (36.7 C)  SpO2: 100% 97%   Vitals:   07/30/21 0930 07/30/21 0956 07/30/21 1015 07/30/21 1035  BP: (!) 148/73 (!) 149/78 (!) 153/62 (!) 141/68  Pulse: 93 90 95 100  Resp: '12 15 18 20  '$ Temp:  98.9 F (37.2 C)  98 F (36.7 C)  TempSrc:  Oral  Oral  SpO2: 100% 94% 100% 97%    General: Pt is alert, awake, not in acute distress Cardiovascular: RRR, nl S1-S2, no murmurs appreciated.   No LE edema.   Respiratory: Normal respiratory rate and rhythm.  Faint rhonchi, no active wheezing, no rales.     MSK: RIght transmet amputation. Abdominal: Abdomen soft and non-tender.  No distension or HSM.   Neuro/Psych: Strength symmetric in upper and lower extremities.  Judgment and insight appear normal.   The results of significant diagnostics from this hospitalization (including imaging, microbiology, ancillary and laboratory) are listed below for reference.     Microbiology: Recent Results (from the past 240 hour(s))  Resp Panel by RT-PCR (Flu A&B, Covid) Nasopharyngeal Swab     Status: None   Collection Time: 07/29/21  1:09 PM   Specimen: Nasopharyngeal Swab; Nasopharyngeal(NP) swabs in vial transport medium  Result Value Ref Range Status   SARS Coronavirus 2 by RT PCR NEGATIVE NEGATIVE Final    Comment: (NOTE) SARS-CoV-2 target nucleic acids are NOT DETECTED.  The SARS-CoV-2 RNA is generally detectable in upper respiratory specimens during the acute phase of infection. The lowest concentration of SARS-CoV-2 viral copies this assay can detect is 138 copies/mL. A negative result does not preclude SARS-Cov-2 infection and should not be used as the sole basis for treatment or other patient management decisions. A negative result may occur with  improper specimen collection/handling, submission of specimen other than nasopharyngeal swab, presence of viral mutation(s) within the areas targeted by this assay, and inadequate number of viral copies(<138 copies/mL). A negative result must be combined with clinical observations, patient history, and epidemiological information. The expected result is Negative.  Fact Sheet for Patients:  EntrepreneurPulse.com.au  Fact Sheet for Healthcare Providers:  IncredibleEmployment.be  This test is no t yet approved or cleared by the [place] FDA and  has been authorized for detection and/or diagnosis of SARS-CoV-2 by FDA under an Emergency Use Authorization (EUA). This EUA will remain  in effect (meaning  this test can be used) for the duration of the COVID-19 declaration under Section 564(b)(1) of the Act, 21 U.S.C.section 360bbb-3(b)(1), unless the authorization is terminated  or revoked sooner.       Influenza A by PCR NEGATIVE NEGATIVE Final   Influenza B by PCR NEGATIVE NEGATIVE Final    Comment: (NOTE) The Xpert Xpress SARS-CoV-2/FLU/RSV plus assay is intended as an aid in the diagnosis of influenza from Nasopharyngeal swab specimens and should not be used as a sole basis for treatment. Nasal washings and  aspirates are unacceptable for Xpert Xpress SARS-CoV-2/FLU/RSV testing.  Fact Sheet for Patients: EntrepreneurPulse.com.au  Fact Sheet for Healthcare Providers: IncredibleEmployment.be  This test is not yet approved or cleared by the [place] FDA and has been authorized for detection and/or diagnosis of SARS-CoV-2 by FDA under an Emergency Use Authorization (EUA). This EUA will remain in effect (meaning this test can be used) for the duration of the COVID-19 declaration under Section 564(b)(1) of the Act, 21 U.S.C. section 360bbb-3(b)(1), unless the authorization is terminated or revoked.  Performed at [facility], [place] [address], [place], [place] [address]      Labs: BNP (last 3 results) Recent Labs    09/10/20 1219 05/04/21 0053 07/29/21 1309  BNP 178.7* 761.3* [id number]*   Basic Metabolic Panel: Recent Labs  Lab 07/27/21 1555 07/29/21 1309 07/29/21 1928 07/30/21 0538  NA 139 138 141 139  K 4.0 4.4 4.8 4.5  CL 98 103  --  98  CO2 30 25  --  27  GLUCOSE 192* 218*  --  266*  BUN 21* 19  --  21*  CREATININE 1.37* 1.49*  --  1.32*  CALCIUM 9.1 8.8*  --  9.2   Liver Function Tests: Recent Labs  Lab 07/29/21 1309  AST 21  ALT 20  ALKPHOS 132*  BILITOT 0.6  PROT 6.7  ALBUMIN 2.9*   Recent Labs  Lab 07/29/21 1309  LIPASE 27   No results for input(s): AMMONIA in the last 168 hours. CBC: Recent Labs   Lab 07/29/21 1309 07/29/21 1928 07/30/21 0538  WBC 14.4*  --  10.4  NEUTROABS 12.1*  --   --   HGB 9.9* 10.2* 10.1*  HCT 34.3* 30.0* 35.1*  MCV 89.8  --  90.0  PLT 343  --  334   Cardiac Enzymes: No results for input(s): CKTOTAL, CKMB, CKMBINDEX, TROPONINI in the last 168 hours. BNP: Invalid input(s): POCBNP CBG: Recent Labs  Lab 07/30/21 0014 07/30/21 0845  GLUCAP 284* 257*   D-Dimer No results for input(s): DDIMER in the last 72 hours. Hgb A1c No results for input(s): HGBA1C in the last 72 hours. Lipid Profile No results for input(s): CHOL, HDL, LDLCALC, TRIG, CHOLHDL, LDLDIRECT in the last 72 hours. Thyroid function studies No results for input(s): TSH, T4TOTAL, T3FREE, THYROIDAB in the last 72 hours.  Invalid input(s): FREET3 Anemia work up No results for input(s): VITAMINB12, FOLATE, FERRITIN, TIBC, IRON, RETICCTPCT in the last 72 hours. Urinalysis    Component Value Date/Time   COLORURINE YELLOW 07/29/2021 [place] 07/29/2021 1418   LABSPEC 1.015 07/29/2021 1418   PHURINE 8.0 07/29/2021 1418   GLUCOSEU >=500 (A) 07/29/2021 1418   HGBUR NEGATIVE 07/29/2021 1418   BILIRUBINUR NEGATIVE 07/29/2021 1418   KETONESUR 5 (A) 07/29/2021 1418   PROTEINUR 30 (A) 07/29/2021 1418   UROBILINOGEN 0.2 09/05/2015 0012   NITRITE NEGATIVE 07/29/2021 1418   LEUKOCYTESUR NEGATIVE 07/29/2021 1418   Sepsis Labs Invalid input(s): PROCALCITONIN,  WBC,  LACTICIDVEN Microbiology Recent Results (from the past 240 hour(s))  Resp Panel by RT-PCR (Flu A&B, Covid) Nasopharyngeal Swab     Status: None   Collection Time: 07/29/21  1:09 PM   Specimen: Nasopharyngeal Swab; Nasopharyngeal(NP) swabs in vial transport medium  Result Value Ref Range Status   SARS Coronavirus 2 by RT PCR NEGATIVE NEGATIVE Final    Comment: (NOTE) SARS-CoV-2 target nucleic acids are NOT DETECTED.  The SARS-CoV-2 RNA is generally detectable in upper respiratory specimens  during the acute  phase of infection. The lowest concentration of SARS-CoV-2 viral copies this assay can detect is 138 copies/mL. A negative result does not preclude SARS-Cov-2 infection and should not be used as the sole basis for treatment or other patient management decisions. A negative result may occur with  improper specimen collection/handling, submission of specimen other than nasopharyngeal swab, presence of viral mutation(s) within the areas targeted by this assay, and inadequate number of viral copies(<138 copies/mL). A negative result must be combined with clinical observations, patient history, and epidemiological information. The expected result is Negative.  Fact Sheet for Patients:  EntrepreneurPulse.com.au  Fact Sheet for Healthcare Providers:  IncredibleEmployment.be  This test is no t yet approved or cleared by the [place] FDA and  has been authorized for detection and/or diagnosis of SARS-CoV-2 by FDA under an Emergency Use Authorization (EUA). This EUA will remain  in effect (meaning this test can be used) for the duration of the COVID-19 declaration under Section 564(b)(1) of the Act, 21 U.S.C.section 360bbb-3(b)(1), unless the authorization is terminated  or revoked sooner.       Influenza A by PCR NEGATIVE NEGATIVE Final   Influenza B by PCR NEGATIVE NEGATIVE Final    Comment: (NOTE) The Xpert Xpress SARS-CoV-2/FLU/RSV plus assay is intended as an aid in the diagnosis of influenza from Nasopharyngeal swab specimens and should not be used as a sole basis for treatment. Nasal washings and aspirates are unacceptable for Xpert Xpress SARS-CoV-2/FLU/RSV testing.  Fact Sheet for Patients: EntrepreneurPulse.com.au  Fact Sheet for Healthcare Providers: IncredibleEmployment.be  This test is not yet approved or cleared by the [place] FDA and has been authorized for detection and/or diagnosis of  SARS-CoV-2 by FDA under an Emergency Use Authorization (EUA). This EUA will remain in effect (meaning this test can be used) for the duration of the COVID-19 declaration under Section 564(b)(1) of the Act, 21 U.S.C. section 360bbb-3(b)(1), unless the authorization is terminated or revoked.  Performed at [facility], [place] [address], [place], [place] [address]      Time coordinating discharge: 25 minutes         SIGNED:   Edwin Dada, MD  [facility] 07/30/2021, 10:42 AM

## 2021-08-03 ENCOUNTER — Encounter (HOSPITAL_BASED_OUTPATIENT_CLINIC_OR_DEPARTMENT_OTHER): Payer: Medicare Other | Admitting: Internal Medicine

## 2021-08-03 ENCOUNTER — Other Ambulatory Visit: Payer: Self-pay

## 2021-08-03 DIAGNOSIS — S91301D Unspecified open wound, right foot, subsequent encounter: Secondary | ICD-10-CM | POA: Diagnosis not present

## 2021-08-03 NOTE — Progress Notes (Signed)
AMMANDA, AFFLECK ([id number]) Visit Report for 08/03/2021 Arrival Information Details Patient Name: Date of Service: [name] 08/03/2021 2:15 PM Medical Record Number: [id number] Patient Account Number: [id number] Date of Birth/Sex: Treating RN: 17-Aug-1961 (60 y.o. Tonita Phoenix, Lauren Primary Care Darryll Raju: [place] Other Clinician: Referring Dariyah Garduno: Treating Spencer Peterkin/Extender: Johnn Hai, Jairo Ben in Treatment: 23 Visit Information History Since Last Visit Added or deleted any medications: No Patient Arrived: Wheel Chair Any new allergies or adverse reactions: No Arrival Time: 15:05 Had a fall or experienced change in No Accompanied By: self activities of daily living that may affect Transfer Assistance: None risk of falls: Patient Identification Verified: Yes Signs or symptoms of abuse/neglect since last visito No Secondary Verification Process Completed: Yes Hospitalized since last visit: No Patient Requires Transmission-Based Precautions: No Implantable device outside of the clinic excluding No Patient Has Alerts: No cellular tissue based products placed in the center since last visit: Has Dressing in Place as Prescribed: Yes Pain Present Now: No Electronic Signature(s) Signed: 08/03/2021 4:02:45 PM By: Sandre Kitty Entered By: Sandre Kitty on 08/03/2021 15:05:49 -------------------------------------------------------------------------------- Encounter Discharge Information Details Patient Name: Date of Service: [name], [name] 08/03/2021 2:15 PM Medical Record Number: [id number] Patient Account Number: [id number] Date of Birth/Sex: Treating RN: Feb 24, 1961 (60 y.o. [name] Primary Care Nafeesa Dils: [place] Other Clinician: Referring Ayeisha Lindenberger: Treating Chadric Kimberley/Extender: Johnn Hai, Jairo Ben in Treatment: 23 Encounter Discharge Information Items Post Procedure Vitals Discharge Condition:  Stable Temperature (F): 97.4 Ambulatory Status: Wheelchair Pulse (bpm): 74 Discharge Destination: Home Respiratory Rate (breaths/min): 17 Transportation: Private Auto Blood Pressure (mmHg): 144/74 Accompanied By: self Schedule Follow-up Appointment: Yes Clinical Summary of Care: Patient Declined Electronic Signature(s) Signed: 08/03/2021 6:09:57 PM By: [name] RN Entered By: [name] on 08/03/2021 17:04:07 -------------------------------------------------------------------------------- Lower Extremity Assessment Details Patient Name: Date of Service: Durward Parcel S. 08/03/2021 2:15 PM Medical Record Number: [id number] Patient Account Number: [id number] Date of Birth/Sex: Treating RN: 06-25-1961 (60 y.o. [name] Primary Care Damel Querry: [place] Other Clinician: Referring Jozlyn Schatz: Treating Bhavika Schnider/Extender: Johnn Hai, [name] in Treatment: 23 Edema Assessment Assessed: [Left: No] [Right: Yes] Edema: [Left: Yes] [Right: Yes] Calf Left: Right: Point of Measurement: From Medial Instep 44 cm 45 cm Ankle Left: Right: Point of Measurement: From Medial Instep 22.8 cm 22.5 cm Vascular Assessment Pulses: Dorsalis Pedis Palpable: [Right:Yes] Posterior Tibial Palpable: [Right:Yes] Electronic Signature(s) Signed: 08/03/2021 6:09:57 PM By: [name] RN Entered By: [name] on 08/03/2021 15:28:04 -------------------------------------------------------------------------------- Multi Wound Chart Details Patient Name: Date of Service: [name], [name] 08/03/2021 2:15 PM Medical Record Number: [id number] Patient Account Number: [id number] Date of Birth/Sex: Treating RN: 1961-01-20 (60 y.o. [name] Primary Care Caspar Favila: [place] Other Clinician: Referring Sanad Fearnow: Treating Britain Saber/Extender: Johnn Hai, [name] in Treatment: 23 Vital Signs Height(in):  64 Capillary Blood Glucose(mg/dl): 245 Weight(lbs): 222 Pulse(bpm): 67 Body Mass Index(BMI): 24 Blood Pressure(mmHg): 137/63 Temperature(F): 98.1 Respiratory Rate(breaths/min): 22 Photos: [16:Right, Plantar Amputation Site -] [N/A:N/A N/A] Wound Location: [16:Transmetatarsal Gradually Appeared] [N/A:N/A] Wounding Event: [16:Diabetic Wound/Ulcer of the Lower] [N/A:N/A] Primary Etiology: [16:Extremity Chronic Obstructive Pulmonary] [N/A:N/A] Comorbid History: [16:Disease (COPD), Sleep Apnea, Arrhythmia, Congestive Heart Failure, Coronary Artery Disease, Hypertension, Peripheral Arterial Disease, Cirrhosis , Type II Diabetes, Osteoarthritis, Neuropathy 02/05/2021] [N/A:N/A] Date Acquired: [16:23] [N/A:N/A] Weeks of Treatment: [16:Open] [N/A:N/A] Wound Status: [16:1x1.3x0.5] [N/A:N/A] Measurements L x W x D (cm) [16:1.021] [N/A:N/A] A (cm) : rea [16:0.511] [N/A:N/A] Volume (cm) : [16:37.50%] [  N/A:N/A] % Reduction in A [16:rea: -4.30%] [N/A:N/A] % Reduction in Volume: [16:Grade 2] [N/A:N/A] Classification: [16:Medium] [N/A:N/A] Exudate A mount: [16:Serous] [N/A:N/A] Exudate Type: [16:amber] [N/A:N/A] Exudate Color: [16:Thickened] [N/A:N/A] Wound Margin: [16:Large (67-100%)] [N/A:N/A] Granulation A mount: [16:Red] [N/A:N/A] Granulation Quality: [16:Small (1-33%)] [N/A:N/A] Necrotic A mount: [16:Fat Layer (Subcutaneous Tissue): Yes N/A] Exposed Structures: [16:Fascia: No Tendon: No Muscle: No Joint: No Bone: No None] [N/A:N/A] Epithelialization: [16:Debridement - Excisional] [N/A:N/A] Debridement: Pre-procedure Verification/Time Out 15:25 [N/A:N/A] Taken: [16:Lidocaine] [N/A:N/A] Pain Control: [16:Callus, Subcutaneous, Slough] [N/A:N/A] Tissue Debrided: [16:Skin/Subcutaneous Tissue] [N/A:N/A] Level: [16:1.3] [N/A:N/A] Debridement A (sq cm): [16:rea Curette] [N/A:N/A] Instrument: [16:Minimum] [N/A:N/A] Bleeding: [16:Pressure] [N/A:N/A] Hemostasis A chieved: [16:0]  [N/A:N/A] Procedural Pain: [16:0] [N/A:N/A] Post Procedural Pain: [16:Procedure was tolerated well] [N/A:N/A] Debridement Treatment Response: [16:1x1.3x0.5] [N/A:N/A] Post Debridement Measurements L x W x D (cm) [16:0.511] [N/A:N/A] Post Debridement Volume: (cm) [16:Debridement] [N/A:N/A] Treatment Notes Electronic Signature(s) Signed: 08/03/2021 4:30:53 PM By: Linton Ham MD Signed: 08/03/2021 6:09:57 PM By: [name] RN Entered By: Linton Ham on 08/03/2021 15:42:52 -------------------------------------------------------------------------------- Multi-Disciplinary Care Plan Details Patient Name: Date of Service: [name], [name] 08/03/2021 2:15 PM Medical Record Number: [id number] Patient Account Number: [id number] Date of Birth/Sex: Treating RN: 1961-07-21 (60 y.o. [name] Primary Care Anthonyjames Bargar: [place] Other Clinician: Referring Makayela Secrest: Treating Ramiah Helfrich/Extender: Johnn Hai, [name] in Treatment: 23 Active Inactive Wound/Skin Impairment Nursing Diagnoses: Impaired tissue integrity Goals: Patient/caregiver will verbalize understanding of skin care regimen Date Initiated: 02/19/2021 Target Resolution Date: 08/13/2021 Goal Status: Active Ulcer/skin breakdown will have a volume reduction of 30% by week 4 Date Initiated: 02/19/2021 Date Inactivated: 03/29/2021 Target Resolution Date: 04/09/2021 Goal Status: Unmet Unmet Reason: PAD Interventions: Assess patient/caregiver ability to obtain necessary supplies Assess patient/caregiver ability to perform ulcer/skin care regimen upon admission and as needed Assess ulceration(s) every visit Provide education on ulcer and skin care Treatment Activities: Skin care regimen initiated : 02/19/2021 Topical wound management initiated : 02/19/2021 Notes: Electronic Signature(s) Signed: 08/03/2021 6:09:57 PM By: [name] RN Entered By: [name] on 08/03/2021  17:01:18 -------------------------------------------------------------------------------- Pain Assessment Details Patient Name: Date of Service: Durward Parcel S. 08/03/2021 2:15 PM Medical Record Number: [id number] Patient Account Number: [id number] Date of Birth/Sex: Treating RN: 1961/01/06 (60 y.o. [name] Primary Care Zayden Hahne: [place] Other Clinician: Referring Dixie Jafri: Treating Caresse Sedivy/Extender: Johnn Hai, [name] in Treatment: 23 Active Problems Location of Pain Severity and Description of Pain Patient Has Paino No Site Locations Pain Management and Medication Current Pain Management: Electronic Signature(s) Signed: 08/03/2021 4:02:45 PM By: Sandre Kitty Signed: 08/03/2021 6:09:57 PM By: [name] RN Entered By: Sandre Kitty on 08/03/2021 15:06:21 -------------------------------------------------------------------------------- Patient/Caregiver Education Details Patient Name: Date of Service: [name] 9/20/2022andnbsp2:15 PM Medical Record Number: [id number] Patient Account Number: [id number] Date of Birth/Gender: Treating RN: Jun 29, 1961 (60 y.o. [name] Primary Care Physician: [place] Other Clinician: Referring Physician: Treating Physician/Extender: Johnn Hai, Jairo Ben in Treatment: 23 Education Assessment Education Provided To: Patient Education Topics Provided Basic Hygiene: Methods: Explain/Verbal Responses: Reinforcements needed Engineer, maintenance) Signed: 08/03/2021 6:09:57 PM By: [name] RN Entered By: [name] on 08/03/2021 17:01:33 -------------------------------------------------------------------------------- Wound Assessment Details Patient Name: Date of Service: Durward Parcel S. 08/03/2021 2:15 PM Medical Record Number: [id number] Patient Account Number: [id number] Date of Birth/Sex: Treating RN: 06/19/1961 (60 y.o.  [name] Primary Care Anthonee Gelin: [place] Other Clinician: Referring Lissy Deuser: Treating Ginette Bradway/Extender: Johnn Hai, [name] in Treatment: 23 Wound  Status Wound Number: 16 Primary Diabetic Wound/Ulcer of the Lower Extremity Etiology: Wound Location: Right, Plantar Amputation Site - Transmetatarsal Wound Open Wounding Event: Gradually Appeared Status: Date Acquired: 02/05/2021 Comorbid Chronic Obstructive Pulmonary Disease (COPD), Sleep Apnea, Weeks Of Treatment: 23 History: Arrhythmia, Congestive Heart Failure, Coronary Artery Disease, Clustered Wound: No Hypertension, Peripheral Arterial Disease, Cirrhosis , Type II Diabetes, Osteoarthritis, Neuropathy Photos Wound Measurements Length: (cm) 1 Width: (cm) 1.3 Depth: (cm) 0.5 Area: (cm) 1.021 Volume: (cm) 0.511 % Reduction in Area: 37.5% % Reduction in Volume: -4.3% Epithelialization: None Wound Description Classification: Grade 2 Wound Margin: Thickened Exudate Amount: Medium Exudate Type: Serous Exudate Color: amber Foul Odor After Cleansing: No Slough/Fibrino Yes Wound Bed Granulation Amount: Large (67-100%) Exposed Structure Granulation Quality: Red Fascia Exposed: No Necrotic Amount: Small (1-33%) Fat Layer (Subcutaneous Tissue) Exposed: Yes Necrotic Quality: Adherent Slough Tendon Exposed: No Muscle Exposed: No Joint Exposed: No Bone Exposed: No Treatment Notes 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 Wound Cleanser Discharge Instruction: Cleanse the wound with wound cleanser or normal saline prior to applying a clean dressing using gauze sponges, not tissue or cotton balls. Peri-Wound Care Sween Lotion (Moisturizing lotion) Discharge Instruction: Apply moisturizing lotion as  directed Topical Primary Dressing Promogran Prisma Matrix, 4.34 (sq in) (silver collagen) Discharge Instruction: Moisten collagen with saline or hydrogel Secondary Dressing Woven Gauze Sponge, Non-Sterile 4x4 in Discharge Instruction: Apply over primary dressing as directed. Optifoam Non-Adhesive Dressing, 4x4 in Discharge Instruction: Foam donut to help offload Secured With Compression Wrap Kerlix Roll 4.5x3.1 (in/yd) Discharge Instruction: Apply Kerlix and Coban compression as directed. Coban Self-Adherent Wrap 4x5 (in/yd) Discharge Instruction: Apply over Kerlix as directed. Compression Stockings Add-Ons Electronic Signature(s) Signed: 08/03/2021 5:42:07 PM By: Levan Hurst RN, BSN Entered By: Levan Hurst on 08/03/2021 15:04:54 -------------------------------------------------------------------------------- [place] Details Patient Name: Date of Service: [name], [name] 08/03/2021 2:15 PM Medical Record Number: [id number] Patient Account Number: [id number] Date of Birth/Sex: Treating RN: 03/29/1961 (60 y.o. [name] Primary Care Shereece Wellborn: [place] Other Clinician: Referring Nguyen Butler: Treating Tuesday Terlecki/Extender: Johnn Hai, [name] in Treatment: 23 Vital Signs Time Taken: 15:05 Temperature (F): 98.1 Height (in): 64 Pulse (bpm): 93 Weight (lbs): 222 Respiratory Rate (breaths/min): 22 Body Mass Index (BMI): 38.1 Blood Pressure (mmHg): 137/63 Capillary Blood Glucose (mg/dl): 245 Reference Range: 80 - 120 mg / dl Electronic Signature(s) Signed: 08/03/2021 4:02:45 PM By: Sandre Kitty Entered By: Sandre Kitty on 08/03/2021 15:06:11

## 2021-08-04 ENCOUNTER — Telehealth (HOSPITAL_COMMUNITY): Payer: Self-pay

## 2021-08-04 NOTE — Telephone Encounter (Signed)
Attempted to reach [name] to check in for [facility] but no answer. VM left, I will continue to reach out.

## 2021-08-04 NOTE — Progress Notes (Signed)
**Note Karla-Identified via Obfuscation** [name], [name] ([id number]) Visit Report for 08/03/2021 Debridement Details Patient Name: Date of Service: [name] 08/03/2021 2:15 PM Medical Record Number: [id number] Patient Account Number: [id number] Date of Birth/Sex: Treating RN: [birth year] (60 y.o. [name] Primary Care Provider: [facility] Other Clinician: Referring Provider: Treating Provider/Extender: [name], [name] in Treatment: 23 Debridement Performed for Assessment: Wound #16 Right,Plantar Amputation Site - Transmetatarsal Performed By: Physician [name] Debridement Type: Debridement Severity of Tissue Pre Debridement: Fat layer exposed Level of Consciousness (Pre-procedure): Awake and Alert Pre-procedure Verification/Time Out Yes - 15:25 Taken: Start Time: 15:25 Pain Control: Lidocaine T Area Debrided (L x W): otal 1 (cm) x 1.3 (cm) = 1.3 (cm) Tissue and other material debrided: Viable, Non-Viable, Callus, Slough, Subcutaneous, Skin: Dermis , Skin: Epidermis, Slough Level: Skin/Subcutaneous Tissue Debridement Description: Excisional Instrument: Curette Bleeding: Minimum Hemostasis Achieved: Pressure End Time: 15:25 Procedural Pain: 0 Post Procedural Pain: 0 Response to Treatment: Procedure was tolerated well Level of Consciousness (Post- Awake and Alert procedure): Post Debridement Measurements of Total Wound Length: (cm) 1 Width: (cm) 1.3 Depth: (cm) 0.5 Volume: (cm) 0.511 Character of Wound/Ulcer Post Debridement: Improved Severity of Tissue Post Debridement: Fat layer exposed Post Procedure Diagnosis Same as Pre-procedure Electronic Signature(s) Signed: 08/03/2021 4:30:53 PM By: Karla Ham Price Signed: 08/03/2021 6:09:57 PM By: Karla Hammock RN Entered By: [name] on 08/03/2021 15:43:22 -------------------------------------------------------------------------------- HPI Details Patient Name: Date of Service: Karla Corwin, DO  Karla S. 08/03/2021 2:15 PM Medical Record Number: [id number] Patient Account Number: [id number] Date of Birth/Sex: Treating RN: November 22, 1960 (60 y.o. [name] Primary Care Provider: [place] Other Clinician: Referring Provider: Treating Provider/Extender: [name], Romelle Starcher Weeks in Treatment: 23 History of Present Illness HPI Description: This 60 year old patient who has a very long significant history of diabetes mellitus, previous alcohol and nicotine abuse, chronic data disease, COPD, diabetes mellitus, height hypertension, critical lower limb ischemia with several wounds being managed at the wound Center at [facility] for over a year. She was recently in the ER at [facility] and was referred to our center. He has had a long history of critical limb ischemia and over a period of time has had balloon angioplasties in March 2016, endarterectomy of the left carotid, lower extremity angiogram and treatment by [name], several cardiac catheterizations. Most recently she had an x-ray of her left foot while in the ER which showed no acute abnormality. during this ER visit she was started on ciprofloxacin and asked to continue with the wound care physicians at [facility]. Her last ABI done in June 2017 showed the right side to be 0.28 in the left side to be 0.48. Her right toe brachial index was 0.17 on the right and 0.27 on the left. her last hemoglobin A1c was 11.1% She continues to smoke about a pack of cigarettes a day. 03/28/2017 -- -- right foot x-ray -- IMPRESSION:Areas of soft tissue swelling. Mild subluxation second PIP joint. No frank dislocation or fracture. No erosive change or bony destruction. No soft tissue ulceration or radiopaque foreign body evident by radiography. There is plantar fascia calcification with a nearby inferior calcaneal spur. There are foci of arterial vascular calcification. 04/04/2017 -- the patient continues to be  noncompliant and continues to complain of a lot of pain and has not done anything about quitting smoking Readmission: 06/26/18 on evaluation today patient presents for reevaluation she has not been seen in our office since May  2018. Since she was last seen here in our office she has undergone testing at [name] office where it was revealed that she had an ABI of 0.68 with her previous ABI being 0.64 and a left ABI of 0.38 with previous ABI being 0.34 this study which was performed on 04/05/18 was pretty much equivalent to the study performed on 11/22/17. The findings in the end revealed on the right would appear to be moderate right lower extremity arterial disease on the left [name] states moderate in the report but unfortunately this appears to be much more severe at 0.38 compared to the right. Subsequently the patient was scheduled to have angiography with [name] on 04/12/18. This was however canceled due to the fact that the patient was Price to have chronic kidney disease stage III and it was to the point that he did not feel that it will be safe to pursue angiography at that point. She has not been on any antibiotics recently. At this point [name] has not rescheduled anything as far as the injured [name] according to the patient he is somewhat reluctant to do so. Nonetheless with her diminished blood flow this is gonna make it somewhat difficult for her to heal. 07/05/18; this is a patient I have not seen previously. She has very significant PAD as noted above. She apparently has had revascularization efforts by [name] on the right on 3 occasions to the patient. She was supposed to have an attempted angiography on the left however this was canceled apparently because of stage III chronic renal failure. I will need to research all of this. She complains of significant pain in the wound and has claudication enough that when she walks to the end of the driveway she has to stop. She  is been using silver alginate to the wounds on her legs and Iodoflex to the area on her left second toe. 07/13/18 on evaluation today patient's wounds actually appear to be doing about the same. She has an appointment she tells me within the next month that is September 2019 with [name] to discuss options to see if there's anything else that you can recommend or do for her. Nonetheless obviously what we're trying to do is do what we can to save her leg and in turn prevent any additional worsening or damage. None in the meantime we been mainly trying to manage her ulcers as best we can. 07/27/18; some improvement in the multiplicity of wounds on her left lower extremity and foot. She's been using silver alginate. I was unable to determine that she actually has an appointment with [name]. We are checking into this The patient's wound includes Left lateral foot, left plantar heel, her left anterior calf wound looks close to me, left fourth toe is very close to closed and the left medial calf is perhaps the largest open area READMISSION 02/19/2021 This is a now 60 year old woman with type 2 diabetes and continued cigarette smoking. She has known PAD. She was last in this clinic in 2019 at that point with wounds on her left foot. She left in a nonhealed state. On 06/28/2019 I see she had a left femoropopliteal by vein and vascular with an amputation of the fifth ray. These managed to heal over. Her last arterial studies were in February 2021. This showed an absent waveform at the posterior tibial artery on the right a dampened monophasic dorsalis pedis on the right of 0.44. On the left again  the PTA TA was absent her dorsalis pedis was 0.99 triphasic and her great toe pressure was 0.65. This would have been after her revascularization. Once again she tells me that [name] has done revascularization on the right leg on 3 different occasions. She has a right transmetatarsal amputation apparently done  by Dr. Sharol Given remotely but I do not see a note for these right lower extremity revascularization but I may not of looked back far enough. In any case she says that the she has a wound on the plantar aspect of the right transmetatarsal amputation site there is been there for several months. More recently she fell and had a wound on her right medial lower leg she had 5 sutures placed in the ER and then subsequently she has developed an area on the medial lower leg which was a blister that opened up. Past medical history includes type 2 diabetes, PAD, chronic renal failure, congestive heart failure, right transmetatarsal amputation, left fifth ray amputation, continued tobacco abuse, atrial fibrillation and cirrhosis. We did not attempt an ABI on the right leg today because of pain 02/26/2021; patient we admitted to the clinic last week. She has what looks to be 2 areas on her right medial and right anterior lower leg that look more like venous wounds but she has 1 on the first met head at the base of her right transmet. She has known severe PAD. She complains of a lot of pain although some of this may be neuropathic. Is difficult to exclude a component of claudication. Use silver alginate on the wounds on her legs and Iodoflex on the area on the first met head. She has an appointment with [name] on 5/25 although I will text him and discussed the situation. She is have poorly controlled diabetic. She has has stage IIIb chronic renal failure 4/22; patient presents for 1 week follow-up. The 2 areas on her right medial and right anterior lower leg appear well-healing. She has been using silver alginate to this area without issues. She has a first met head ulcer and it is unclear how long this has been there as it was discovered in the ED earlier this month when she was being evaluated for another issue. Iodoflex has been used at this area. 4/29; patient presents for 1 week follow-up. She has been using  silver alginate to the leg wounds and Iodoflex to the plantar foot wound. She has had this wrapped with Coban and Kerlix. She has no concerns or complaints today. 5/6; this is a difficult wound on the right plantar foot transmit site. We are not making a lot of progress. She had 2 more venous looking wounds on the right medial and right anterior lower leg 1 of which is healed. Her appointment with [name] is on 5/25 5/16; she did not tolerate the offloading shoe we gave her in fact she had a fall with an abrasion on her right forearm she is now back in the modified small shoe which does not offload her foot properly. Her appoint with [name] is still on 5/25 we have been using Iodoflex. The area on the right anterior lower leg is healed 7/18; patient presents for follow-up however has not been seen in 2 months. She was last seen at the end of May. She had a fall at the end of June and was hospitalized. She has been unable to follow-up since then. For the wound she has only been keeping it covered with gauze.  She is scheduled for an aortogram with lower extremity runoff at the end of July. 7/29; patient presents for 2-week follow-up. She has home health and they have been changing her dressings. She denies any issues and has no complaints today. She states she had a procedure where they opened up one of her vessels in her legs. She denies signs of infection. 8/5; patient presents for follow-up. She was recently in the hospital for bradycardia. She was noted to have cellulitis to the left lower extremity and Unna boot was placed due to blisters and increased swelling. She reports improvement to her left leg since being in the hospital and stability check her right plantar foot wound. She denies signs of infection. 8/23; since I last saw this patient a lot has happened. She still has the area over the right first met head in the setting of previous transmetatarsal amputation. Firstly most  importantly she underwent revascularization of her occluded right SFA. She underwent directional atherectomy followed by drug-coated balloon angioplasty. She has no named vessel below the knee hopefully the revascularization of the right SFA will improve collateral flow. It is not felt however that she has any endovascular options. She had follow-up arterial studies noninvasive on 8/9. These showed an ABI of 0.44 at the right PTA. Monophasic waveforms. On the left her great toe ABI was 0.68. Her follow-up arterial Doppler showed a 50 to 74% stenosis in the proximal SFA and a 50 to 74% stenosis in the distal SFA mild to severe atherosclerosis noted throughout the extremity. Areas of shadowing plaque seen, unable to rule out higher grade stenosis she also had a fall apparently wearing a right foot forefoot off loader that we gave her. She therefore comes in in an ordinary running shoe today. Not certain if this is the fall that ended up in the hospital with bradycardia 9/6 area over the right first metatarsal head in the setting of her previous amputation and severe PAD. She is also a diabetic with known PAD status post attempt at revascularization. She is continued smoker Again the separation of visits in the clinic is somewhat disturbing if we are going to consider her for a total contact cast. She is not wearing anything to offload this stating it causes imbalance especially the forefoot off loader. She basically comes in in bedroom slippers She also had a fall this morning about an hour ago. She has a skin tear on the left dorsal forearm 9/13; areas over the right first metatarsal head in the setting of previous TMA and severe PAD. Again she comes in here in slippers. We have been using Hydrofera Blue. We use MolecuLight on this which was essentially negative study 9/20; right first metatarsal head in the setting of her previous TMA and severe PAD. Same slipper type shoes. She says she cannot wear a  forefoot off loader and for some reason she will not wear surgical shoes. She says she is smoking half a pack per day. Electronic Signature(s) Signed: 08/03/2021 4:30:53 PM By: Karla Ham Price Entered By: [name] on 08/03/2021 15:44:35 -------------------------------------------------------------------------------- Physical Exam Details Patient Name: Date of Service: Karla Corwin, DO Karla S. 08/03/2021 2:15 PM Medical Record Number: [id number] Patient Account Number: [id number] Date of Birth/Sex: Treating RN: [birth year] (60 y.o. [name] Primary Care Provider: [place] Other Clinician: Referring Provider: Treating Provider/Extender: [name], [name] in Treatment: 23 Constitutional Sitting or standing Blood Pressure is within target range for patient.. Pulse regular and within target range  for patient.[name] Respirations regular, non-labored and within target range.. Temperature is normal and within the target range for the patient.[name] Appears in no distress. Notes Wound exam; right first metatarsal head. Previous TMA. Once again she has thick skin and callus around this wound which I removed. Also have very brittle surface. The undermining at 10-1 o'clock is still there. Does not seem to go down the bone however. There is no surrounding erythema Electronic Signature(s) Signed: 08/03/2021 4:30:53 PM By: Karla Ham Price Entered By: [name] on 08/03/2021 15:45:47 -------------------------------------------------------------------------------- Physician Orders Details Patient Name: Date of Service: Karla Corwin, DO Karla S. 08/03/2021 2:15 PM Medical Record Number: [id number] Patient Account Number: [id number] Date of Birth/Sex: Treating RN: October 25, 1961 (60 y.o. [name] Primary Care Provider: [facility] Other Clinician: Referring Provider: Treating Provider/Extender: [name], [name] in Treatment: 23 Verbal /  Phone Orders: No Diagnosis Coding Follow-up Appointments ppointment in 1 week. - with Dr. Dellia Nims Return A Bathing/ Shower/ Hygiene May shower with protection but do not get wound dressing(s) wet. Edema Control - Lymphedema / SCD / Other Elevate legs to the level of the heart or above for 30 minutes daily and/or when sitting, a frequency of: - throughout the day Avoid standing for long periods of time. Patient to wear own compression stockings every day. - left leg Exercise regularly Off-Loading Open toe surgical shoe to: - with felt padding to offload Additional Orders / Instructions Stop/Decrease Smoking Follow Nutritious Diet Wound Treatment Wound #16 - Amputation Site - Transmetatarsal Wound Laterality: Plantar, Right Cleanser: Soap and Water 1 x Per Week/7 Days Discharge Instructions: May shower and wash wound with dial antibacterial soap and water prior to dressing change. Cleanser: Wound Cleanser 1 x Per Week/7 Days Discharge Instructions: Cleanse the wound with wound cleanser or normal saline prior to applying a clean dressing using gauze sponges, not tissue or cotton balls. Peri-Wound Care: Sween Lotion (Moisturizing lotion) (Home Health) 1 x Per Week/7 Days Discharge Instructions: Apply moisturizing lotion as directed Prim Dressing: Promogran Prisma Matrix, 4.34 (sq in) (silver collagen) 1 x Per Week/7 Days ary Discharge Instructions: Moisten collagen with saline or hydrogel Secondary Dressing: Woven Gauze Sponge, Non-Sterile 4x4 in 1 x Per Week/7 Days Discharge Instructions: Apply over primary dressing as directed. Secondary Dressing: Optifoam Non-Adhesive Dressing, 4x4 in 1 x Per Week/7 Days Discharge Instructions: Foam donut to help offload Compression Wrap: Kerlix Roll 4.5x3.1 (in/yd) 1 x Per Week/7 Days Discharge Instructions: Apply Kerlix and Coban compression as directed. Compression Wrap: Coban Self-Adherent Wrap 4x5 (in/yd) 1 x Per Week/7 Days Discharge  Instructions: Apply over Kerlix as directed. Electronic Signature(s) Signed: 08/03/2021 4:30:53 PM By: Karla Ham Price Signed: 08/03/2021 6:09:57 PM By: Karla Hammock RN Entered By: [name] on 08/03/2021 15:30:39 -------------------------------------------------------------------------------- Problem List Details Patient Name: Date of Service: Karla Corwin, DO Karla S. 08/03/2021 2:15 PM Medical Record Number: [id number] Patient Account Number: [id number] Date of Birth/Sex: Treating RN: 1961/03/05 (60 y.o. [name] Primary Care Provider: [facility] Other Clinician: Referring Provider: Treating Provider/Extender: [name], [name] in Treatment: 23 Active Problems ICD-10 Encounter Code Description Active Date MDM Diagnosis S91.301D Unspecified open wound, right foot, subsequent encounter 05/31/2021 No Yes 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 2 diabetes mellitus with foot ulcer 02/19/2021 No Yes E11.51 Type 2 diabetes mellitus with diabetic peripheral angiopathy without gangrene 02/19/2021 No Yes I87.2 Venous insufficiency (chronic) (peripheral) 06/18/2021 No Yes Inactive Problems ICD-10 Code Description Active Date Inactive Date L97.811 Non-pressure chronic ulcer of other part of  right lower leg limited to breakdown of skin 02/19/2021 02/19/2021 S81.802A Unspecified open wound, left lower leg, initial encounter 06/18/2021 06/18/2021 S40.812D Abrasion of left upper arm, subsequent encounter 07/20/2021 07/20/2021 Resolved Problems ICD-10 Code Description Active Date Resolved Date S40.811D Abrasion of right upper arm, subsequent encounter 03/29/2021 03/29/2021 Electronic Signature(s) Signed: 08/03/2021 4:30:53 PM By: Karla Ham Price Entered By: [name] on 08/03/2021 15:42:41 -------------------------------------------------------------------------------- Progress Note Details Patient Name: Date of Service: Karla Corwin, DO [name] 08/03/2021 2:15 PM Medical Record Number:  [id number] Patient Account Number: [id number] Date of Birth/Sex: Treating RN: 08/17/1961 (60 y.o. [name] Primary Care Provider: [facility] Other Clinician: Referring Provider: Treating Provider/Extender: [name], [name] in Treatment: 23 Subjective History of Present Illness (HPI) This 60 year old patient who has a very long significant history of diabetes mellitus, previous alcohol and nicotine abuse, chronic data disease, COPD, diabetes mellitus, height hypertension, critical lower limb ischemia with several wounds being managed at the wound Center at [facility] for over a year. She was recently in the ER at [facility] and was referred to our center. He has had a long history of critical limb ischemia and over a period of time has had balloon angioplasties in March 2016, endarterectomy of the left carotid, lower extremity angiogram and treatment by [name], several cardiac catheterizations. Most recently she had an x-ray of her left foot while in the ER which showed no acute abnormality. during this ER visit she was started on ciprofloxacin and asked to continue with the wound care physicians at [facility]. Her last ABI done in June 2017 showed the right side to be 0.28 in the left side to be 0.48. Her right toe brachial index was 0.17 on the right and 0.27 on the left. her last hemoglobin A1c was 11.1% She continues to smoke about a pack of cigarettes a day. 03/28/2017 -- -- right foot x-ray -- IMPRESSION:Areas of soft tissue swelling. Mild subluxation second PIP joint. No frank dislocation or fracture. No erosive change or bony destruction. No soft tissue ulceration or radiopaque foreign body evident by radiography. There is plantar fascia calcification with a nearby inferior calcaneal spur. There are foci of arterial vascular calcification. 04/04/2017 -- the patient continues to be noncompliant and continues to complain of a lot  of pain and has not done anything about quitting smoking Readmission: 06/26/18 on evaluation today patient presents for reevaluation she has not been seen in our office since May 2018. Since she was last seen here in our office she has undergone testing at Dr. Fransico Errin [place] office where it was revealed that she had an ABI of 0.68 with her previous ABI being 0.64 and a left ABI of 0.38 with previous ABI being 0.34 this study which was performed on 04/05/18 was pretty much equivalent to the study performed on 11/22/17. The findings in the end revealed on the right would appear to be moderate right lower extremity arterial disease on the left [name] states moderate in the report but unfortunately this appears to be much more severe at 0.38 compared to the right. Subsequently the patient was scheduled to have angiography with [name] on 04/12/18. This was however canceled due to the fact that the patient was Price to have chronic kidney disease stage III and it was to the point that he did not feel that it will be safe to pursue angiography at that point. She has not been on any antibiotics recently. At this point  [name] has not rescheduled anything as far as the injured Miranda according to the patient he is somewhat reluctant to do so. Nonetheless with her diminished blood flow this is gonna make it somewhat difficult for her to heal. 07/05/18; this is a patient I have not seen previously. She has very significant PAD as noted above. She apparently has had revascularization efforts by [name] on the right on 3 occasions to the patient. She was supposed to have an attempted angiography on the left however this was canceled apparently because of stage III chronic renal failure. I will need to research all of this. She complains of significant pain in the wound and has claudication enough that when she walks to the end of the driveway she has to stop. She is been using silver alginate to the wounds  on her legs and Iodoflex to the area on her left second toe. 07/13/18 on evaluation today patient's wounds actually appear to be doing about the same. She has an appointment she tells me within the next month that is September 2019 with [name] to discuss options to see if there's anything else that you can recommend or do for her. Nonetheless obviously what we're trying to do is do what we can to save her leg and in turn prevent any additional worsening or damage. None in the meantime we been mainly trying to manage her ulcers as best we can. 07/27/18; some improvement in the multiplicity of wounds on her left lower extremity and foot. She's been using silver alginate. I was unable to determine that she actually has an appointment with [name]. We are checking into this The patient's wound includes Left lateral foot, left plantar heel, her left anterior calf wound looks close to me, left fourth toe is very close to closed and the left medial calf is perhaps the largest open area READMISSION 02/19/2021 This is a now 60 year old woman with type 2 diabetes and continued cigarette smoking. She has known PAD. She was last in this clinic in 2019 at that point with wounds on her left foot. She left in a nonhealed state. On 06/28/2019 I see she had a left femoropopliteal by vein and vascular with an amputation of the fifth ray. These managed to heal over. Her last arterial studies were in February 2021. This showed an absent waveform at the posterior tibial artery on the right a dampened monophasic dorsalis pedis on the right of 0.44. On the left again the PTA TA was absent her dorsalis pedis was 0.99 triphasic and her great toe pressure was 0.65. This would have been after her revascularization. Once again she tells me that [name] has done revascularization on the right leg on 3 different occasions. She has a right transmetatarsal amputation apparently done by Dr. Sharol Given remotely but I do not see a note  for these right lower extremity revascularization but I may not of looked back far enough. In any case she says that the she has a wound on the plantar aspect of the right transmetatarsal amputation site there is been there for several months. More recently she fell and had a wound on her right medial lower leg she had 5 sutures placed in the ER and then subsequently she has developed an area on the medial lower leg which was a blister that opened up. Past medical history includes type 2 diabetes, PAD, chronic renal failure, congestive heart failure, right transmetatarsal amputation, left fifth ray amputation, continued tobacco abuse, atrial  fibrillation and cirrhosis. We did not attempt an ABI on the right leg today because of pain 02/26/2021; patient we admitted to the clinic last week. She has what looks to be 2 areas on her right medial and right anterior lower leg that look more like venous wounds but she has 1 on the first met head at the base of her right transmet. She has known severe PAD. She complains of a lot of pain although some of this may be neuropathic. Is difficult to exclude a component of claudication. Use silver alginate on the wounds on her legs and Iodoflex on the area on the first met head. She has an appointment with [name] on 5/25 although I will text him and discussed the situation. She is have poorly controlled diabetic. She has has stage IIIb chronic renal failure 4/22; patient presents for 1 week follow-up. The 2 areas on her right medial and right anterior lower leg appear well-healing. She has been using silver alginate to this area without issues. She has a first met head ulcer and it is unclear how long this has been there as it was discovered in the ED earlier this month when she was being evaluated for another issue. Iodoflex has been used at this area. 4/29; patient presents for 1 week follow-up. She has been using silver alginate to the leg wounds and Iodoflex  to the plantar foot wound. She has had this wrapped with Coban and Kerlix. She has no concerns or complaints today. 5/6; this is a difficult wound on the right plantar foot transmit site. We are not making a lot of progress. She had 2 more venous looking wounds on the right medial and right anterior lower leg 1 of which is healed. Her appointment with [name] is on 5/25 5/16; she did not tolerate the offloading shoe we gave her in fact she had a fall with an abrasion on her right forearm she is now back in the modified small shoe which does not offload her foot properly. Her appoint with [name] is still on 5/25 we have been using Iodoflex. The area on the right anterior lower leg is healed 7/18; patient presents for follow-up however has not been seen in 2 months. She was last seen at the end of May. She had a fall at the end of June and was hospitalized. She has been unable to follow-up since then. For the wound she has only been keeping it covered with gauze. She is scheduled for an aortogram with lower extremity runoff at the end of July. 7/29; patient presents for 2-week follow-up. She has home health and they have been changing her dressings. She denies any issues and has no complaints today. She states she had a procedure where they opened up one of her vessels in her legs. She denies signs of infection. 8/5; patient presents for follow-up. She was recently in the hospital for bradycardia. She was noted to have cellulitis to the left lower extremity and Unna boot was placed due to blisters and increased swelling. She reports improvement to her left leg since being in the hospital and stability check her right plantar foot wound. She denies signs of infection. 8/23; since I last saw this patient a lot has happened. She still has the area over the right first met head in the setting of previous transmetatarsal amputation. Firstly most importantly she underwent revascularization of her  occluded right SFA. She underwent directional atherectomy followed by drug-coated balloon angioplasty. She  has no named vessel below the knee hopefully the revascularization of the right SFA will improve collateral flow. It is not felt however that she has any endovascular options. She had follow-up arterial studies noninvasive on 8/9. These showed an ABI of 0.44 at the right PTA. Monophasic waveforms. On the left her great toe ABI was 0.68. Her follow-up arterial Doppler showed a 50 to 74% stenosis in the proximal SFA and a 50 to 74% stenosis in the distal SFA mild to severe atherosclerosis noted throughout the extremity. Areas of shadowing plaque seen, unable to rule out higher grade stenosis she also had a fall apparently wearing a right foot forefoot off loader that we gave her. She therefore comes in in an ordinary running shoe today. Not certain if this is the fall that ended up in the hospital with bradycardia 9/6 area over the right first metatarsal head in the setting of her previous amputation and severe PAD. She is also a diabetic with known PAD status post attempt at revascularization. She is continued smoker Again the separation of visits in the clinic is somewhat disturbing if we are going to consider her for a total contact cast. She is not wearing anything to offload this stating it causes imbalance especially the forefoot off loader. She basically comes in in bedroom slippers She also had a fall this morning about an hour ago. She has a skin tear on the left dorsal forearm 9/13; areas over the right first metatarsal head in the setting of previous TMA and severe PAD. Again she comes in here in slippers. We have been using Hydrofera Blue. We use MolecuLight on this which was essentially negative study 9/20; right first metatarsal head in the setting of her previous TMA and severe PAD. Same slipper type shoes. She says she cannot wear a forefoot off loader and for some reason she will  not wear surgical shoes. She says she is smoking half a pack per day. Objective Constitutional Sitting or standing Blood Pressure is within target range for patient.. Pulse regular and within target range for patient.[name] Respirations regular, non-labored and within target range.. Temperature is normal and within the target range for the patient.[name] Appears in no distress. Vitals Time Taken: 3:05 PM, Height: 64 in, Weight: 222 lbs, BMI: 38.1, Temperature: 98.1 F, Pulse: 93 bpm, Respiratory Rate: 22 breaths/min, Blood Pressure: 137/63 mmHg, Capillary Blood Glucose: 245 mg/dl. General Notes: Wound exam; right first metatarsal head. Previous TMA. Once again she has thick skin and callus around this wound which I removed. Also have very brittle surface. The undermining at 10-1 o'clock is still there. Does not seem to go down the bone however. There is no surrounding erythema Integumentary (Hair, Skin) Wound #16 status is Open. Original cause of wound was Gradually Appeared. The date acquired was: 02/05/2021. The wound has been in treatment 23 weeks. The wound is located on the Right,Plantar Amputation Site - Transmetatarsal. The wound measures 1cm length x 1.3cm width x 0.5cm depth; 1.021cm^2 area and 0.511cm^3 volume. There is Fat Layer (Subcutaneous Tissue) exposed. There is a medium amount of serous drainage noted. The wound margin is thickened. There is large (67-100%) red granulation within the wound bed. There is a small (1-33%) amount of necrotic tissue within the wound bed including Adherent Slough. Assessment Active Problems ICD-10 Unspecified open wound, right foot, subsequent encounter Type 2 diabetes mellitus with foot ulcer Type 2 diabetes mellitus with diabetic peripheral angiopathy without gangrene Venous insufficiency (chronic) (peripheral) Procedures Wound #16 Pre-procedure  diagnosis of Wound #16 is a Diabetic Wound/Ulcer of the Lower Extremity located on the Right,Plantar Amputation  Site - Transmetatarsal .Severity of Tissue Pre Debridement is: Fat layer exposed. There was a Excisional Skin/Subcutaneous Tissue Debridement with a total area of 1.3 sq cm performed by [name]. With the following instrument(s): Curette to remove Viable and Non-Viable tissue/material. Material removed includes Callus, Subcutaneous Tissue, Slough, Skin: Dermis, and Skin: Epidermis after achieving pain control using Lidocaine. No specimens were taken. A time out was conducted at 15:25, prior to the start of the procedure. A Minimum amount of bleeding was controlled with Pressure. The procedure was tolerated well with a pain level of 0 throughout and a pain level of 0 following the procedure. Post Debridement Measurements: 1cm length x 1.3cm width x 0.5cm depth; 0.511cm^3 volume. Character of Wound/Ulcer Post Debridement is improved. Severity of Tissue Post Debridement is: Fat layer exposed. Post procedure Diagnosis Wound #16: Same as Pre-Procedure Plan Follow-up Appointments: Return Appointment in 1 week. - with Dr. Arcola Jansky Shower/ Hygiene: May shower with protection but do not get wound dressing(s) wet. Edema Control - Lymphedema / SCD / Other: Elevate legs to the level of the heart or above for 30 minutes daily and/or when sitting, a frequency of: - throughout the day Avoid standing for long periods of time. Patient to wear own compression stockings every day. - left leg Exercise regularly Off-Loading: Open toe surgical shoe to: - with felt padding to offload Additional Orders / Instructions: Stop/Decrease Smoking Follow Nutritious Diet WOUND #16: - Amputation Site - Transmetatarsal Wound Laterality: Plantar, Right Cleanser: Soap and Water 1 x Per Week/7 Days Discharge Instructions: May shower and wash wound with dial antibacterial soap and water prior to dressing change. Cleanser: Wound Cleanser 1 x Per Week/7 Days Discharge Instructions: Cleanse the wound with  wound cleanser or normal saline prior to applying a clean dressing using gauze sponges, not tissue or cotton balls. Peri-Wound Care: Sween Lotion (Moisturizing lotion) (Home Health) 1 x Per Week/7 Days Discharge Instructions: Apply moisturizing lotion as directed Prim Dressing: Promogran Prisma Matrix, 4.34 (sq in) (silver collagen) 1 x Per Week/7 Days ary Discharge Instructions: Moisten collagen with saline or hydrogel Secondary Dressing: Woven Gauze Sponge, Non-Sterile 4x4 in 1 x Per Week/7 Days Discharge Instructions: Apply over primary dressing as directed. Secondary Dressing: Optifoam Non-Adhesive Dressing, 4x4 in 1 x Per Week/7 Days Discharge Instructions: Foam donut to help offload Com pression Wrap: Kerlix Roll 4.5x3.1 (in/yd) 1 x Per Week/7 Days Discharge Instructions: Apply Kerlix and Coban compression as directed. Com pression Wrap: Coban Self-Adherent Wrap 4x5 (in/yd) 1 x Per Week/7 Days Discharge Instructions: Apply over Kerlix as directed. 1. I change the primary dressing to silver collagen still under kerlix Coban. 2. She has been revascularized however even with this there is probably significant PAD 3. I cannot rule out further debridements. However I am even concerned about putting this foot in a total contact cast Electronic Signature(s) Signed: 08/03/2021 4:30:53 PM By: Karla Ham Price Entered By: [name] on 08/03/2021 15:47:55 -------------------------------------------------------------------------------- SuperBill Details Patient Name: Date of Service: Karla Corwin, DO Karla S. 08/03/2021 Medical Record Number: [id number] Patient Account Number: [id number] Date of Birth/Sex: Treating RN: February 19, 1961 (60 y.o. [name] Primary Care Provider: Holden Other Clinician: Referring Provider: Treating Provider/Extender: [name], [name] in Treatment: 23 Diagnosis Coding ICD-10 Codes Code Description V78.469G Unspecified open  wound, right foot, subsequent encounter 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 2 diabetes mellitus with foot ulcer  E11.51 Type 2 diabetes mellitus with diabetic peripheral angiopathy without gangrene I87.2 Venous insufficiency (chronic) (peripheral) Facility Procedures CPT4 Code: [id number] Description: 01007 - DEB SUBQ TISSUE 20 SQ CM/< ICD-10 Diagnosis Description S91.301D Unspecified open wound, right foot, subsequent encounter 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 2 diabetes mellitus with foot ulcer Modifier: Quantity: 1 Physician Procedures : CPT4 Code Description Modifier [id number] 11042 - WC PHYS SUBQ TISS 20 SQ CM ICD-10 Diagnosis Description I32.549I Unspecified open wound, right foot, subsequent encounter 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 2 diabetes mellitus with foot ulcer Quantity: 1 Electronic Signature(s) Signed: 08/03/2021 6:09:57 PM By: Karla Hammock RN Signed: 08/04/2021 4:56:16 PM By: Karla Ham Price Previous Signature: 08/03/2021 4:30:53 PM Version By: Karla Ham Price Entered By: [name] on 08/03/2021 17:02:53

## 2021-08-05 ENCOUNTER — Telehealth: Payer: Self-pay

## 2021-08-05 ENCOUNTER — Other Ambulatory Visit (HOSPITAL_COMMUNITY): Payer: Self-pay

## 2021-08-05 DIAGNOSIS — R059 Cough, unspecified: Secondary | ICD-10-CM

## 2021-08-05 NOTE — Telephone Encounter (Signed)
Dr Margarita Rana  -  FYI: I received this message from Marylouise Stacks, EMT today.   I am here with ms Trussell now, her room air 02 was 92 % and she walked, maybe 10 ft and it dropped to 88%. After a few min of rest it only increased to 90% with some shortness of breath.  I see she is to come in next Wednesday. She still has a cough, so not sure if this is related to some type of URI or just from her worsening COPD/smoke use.  But just a heads up for you.

## 2021-08-05 NOTE — Progress Notes (Signed)
Paramedicine Encounter    Patient ID: [name], female    DOB: 08/28/1961, 60 y.o.   MRN: [id number]   Patient Care Team: Charlott Rakes, MD as PCP - General (Family Medicine) [name], MD as PCP - Cardiology (Cardiology) [name], MD as PCP - Electrophysiology (Cardiology) [name], MD as Consulting Physician (Cardiology) [name], MD as Consulting Physician (Pulmonary Disease) System, Provider Not In  Patient Active Problem List   Diagnosis Date Noted   Hyperlipidemia associated with type 2 diabetes mellitus ([place]) 07/29/2021   (HFpEF) heart failure with preserved ejection fraction ([place]) 07/29/2021   Bradycardia 06/13/2021   Uncontrolled type 2 diabetes mellitus with hyperglycemia ([place]) 06/13/2021   Acute kidney injury superimposed on CKD ([place]) 06/13/2021   Critical ischemia of foot ([place]) 06/07/2021   Fall 05/05/2021   PAF (paroxysmal atrial fibrillation) ([place]) 05/05/2021   COPD (chronic obstructive pulmonary disease) ([place]) 05/05/2021   DM (diabetes mellitus), type 2 with complications ([place]) 99991111   PAD (peripheral artery disease) ([place]) 05/05/2021   Cellulitis 05/05/2021   Acute on chronic combined systolic and diastolic CHF (congestive heart failure) ([place]) 05/05/2021   OSA (obstructive sleep apnea) 05/05/2021   CKD (chronic kidney disease) stage 3, GFR 30-59 ml/min ([place]) 05/05/2021   AKI (acute kidney injury) ([place]) 05/04/2021   Pressure injury of skin 05/04/2021   Ulcer of foot, unspecified laterality, limited to breakdown of skin ([place]) 02/17/2021   Altered mental status 01/02/2021   AF (paroxysmal atrial fibrillation) ([place]) 01/02/2021   CAD (coronary artery disease) 01/02/2021   PVD (peripheral vascular disease) ([place]) 01/02/2021   Acute renal failure superimposed on stage 4 chronic kidney disease ([place]) 01/02/2021   Diabetes mellitus type 2, uncontrolled, with complications ([place]) [id number]   Diabetic nephropathy ([place]) 01/02/2021    Low back pain 06/01/2020   Hyperlipidemia 04/03/2020   Muscle weakness 03/20/2020   Closed fracture of neck of right radius 03/13/2020   Pain in elbow 03/12/2020   PAD (peripheral artery disease) ([place]) 12/26/2019   Acute renal failure ([name])    Acute respiratory failure with hypoxia ([place]) 12/02/2019   Acute exacerbation of CHF (congestive heart failure) ([place]) 12/01/2019   Cellulitis in diabetic foot ([place]) 07/08/2019   Osteomyelitis ([place]) 06/21/2019   NICM (nonischemic cardiomyopathy) ([place]) 06/20/2019   Non-healing ulcer ([place]) 06/20/2019   Acute CHF (congestive heart failure) ([place]) 11/26/2018   CHF (congestive heart failure) ([place]) 11/26/2018   Acute respiratory failure ([place]) 10/21/2018   AKI (acute kidney injury) ([place]) 08/18/2018   Coagulation disorder ([place]) 08/09/2017   Depression 07/21/2017   At risk for adverse drug reaction 06/20/2017   Peripheral neuropathy 06/20/2017   Acute osteomyelitis of right foot ([place]) 06/13/2017   S/P transmetatarsal amputation of foot, right ([place]) 06/05/2017   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 ([place]) 05/19/2017   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[place])    SIRS (systemic inflammatory response syndrome) ([place]) 04/06/2017   CKD (chronic kidney disease), stage III ([place]) 11/24/2016   Suspected sleep apnea 11/24/2016   Ulcer of toe of right foot, with necrosis of bone ([place]) 10/27/2016   Ulcer of left lower leg ([place]) 05/19/2016   Severe obesity (BMI >= 40) ([place]) 02/24/2016   COPD GOLD 0  02/24/2016   Morbid obesity ([place]) 02/24/2016   Encounter for therapeutic drug monitoring 02/10/2016   Symptomatic bradycardia 01/12/2016   Hypertension associated with diabetes ([place]) 12/22/2015   Chronic combined systolic and diastolic CHF (congestive heart  failure) ([place])    [place]eze    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 11/08/2015   Tobacco abuse 10/23/2015   Coronary artery disease    DOE (dyspnea on exertion) 04/29/2015    Diabetes mellitus type 2, uncontrolled ([place]) 02/08/2015   Influenza A 02/07/2015   Wrist fracture, left, with routine healing, subsequent encounter 02/05/2015   Wrist fracture, left, closed, initial encounter 01/29/2015   PAF (paroxysmal atrial fibrillation) ([place]) 01/16/2015   Carotid arterial disease ([place]) 01/16/2015   Claudication ([place]) 01/15/2015   Demand ischemia ([place]) 10/29/2014   Insomnia 02/03/2014   COPD with acute exacerbation ([place]) 02/01/2014   S/P peripheral artery angioplasty - TurboHawk atherectomy; R SFA 09/11/2013    Class: Acute   Leg pain, bilateral 08/19/2013   Hypothyroidism 07/31/2013   Cellulitis 06/13/2013   History of cocaine abuse ([place]) 06/13/2013   Long term current use of anticoagulant therapy 05/20/2013   Alcohol abuse    Narcotic abuse ([place])    Marijuana abuse    Alcoholic cirrhosis ([name])    DM (diabetes mellitus), type 2 with peripheral vascular complications ([name])     Current Outpatient Medications:    ACCU-CHEK GUIDE test strip, USE TO CHECK BLOOD SUGAR FOUR TIMES DAILY, Disp: , Rfl:    acetaminophen (TYLENOL) 500 MG tablet, Take 500 mg by mouth every 6 (six) hours as needed for moderate pain., Disp: , Rfl:    albuterol (VENTOLIN HFA) 108 (90 Base) MCG/ACT inhaler, Inhale 2 puffs into the lungs every 6 (six) hours as needed for wheezing or shortness of breath., Disp: 18 g, Rfl: 3   amiodarone (PACERONE) 200 MG tablet, Take 0.5 tablets (100 mg total) by mouth daily., Disp: 45 tablet, Rfl: 3   amLODipine (NORVASC) 10 MG tablet, Take 1 tablet (10 mg total) by mouth daily., Disp: 90 tablet, Rfl: 3   apixaban (ELIQUIS) 5 MG TABS tablet, Take 5 mg by mouth 2 (two) times daily., Disp: , Rfl:    atorvastatin (LIPITOR) 40 MG tablet, Take 40 mg by mouth every evening., Disp: , Rfl:    budesonide-formoterol (SYMBICORT) 160-4.5 MCG/ACT inhaler, Inhale 2 puffs into the lungs 2 (two) times daily., Disp: 1 Inhaler, Rfl: 2   clopidogrel (PLAVIX) 75 MG tablet, Take 1  tablet (75 mg total) by mouth daily with breakfast., Disp: 90 tablet, Rfl: 1   colchicine 0.6 MG tablet, Take 1 tablet (0.6 mg total) by mouth daily., Disp: 30 tablet, Rfl: 3   Continuous Blood Gluc Sensor (FREESTYLE LIBRE 2 SENSOR) MISC, USE 1 (ONE) EACH EVERY 2 WEEKS, Disp: 2 each, Rfl: 11   doxycycline (VIBRA-TABS) 100 MG tablet, Take 1 tablet (100 mg total) by mouth 2 (two) times daily., Disp: 12 tablet, Rfl: 0   Dulaglutide (TRULICITY) A999333 [id number] SOPN, Inject 0.75 mg into the skin every Friday., Disp: 2 mL, Rfl: 6   empagliflozin (JARDIANCE) 10 MG TABS tablet, Take 1 tablet (10 mg total) by mouth daily before breakfast., Disp: 90 tablet, Rfl: 3   FLUoxetine (PROZAC) 20 MG capsule, Take 1 capsule (20 mg total) by mouth at bedtime., Disp: 90 capsule, Rfl: 1   folic acid (FOLVITE) 1 MG tablet, Take 1 tablet (1 mg total) by mouth daily., Disp: 90 tablet, Rfl: 1   hydrALAZINE (APRESOLINE) 25 MG tablet, Take 1 tablet (25 mg total) by mouth 3 (three) times daily., Disp: 90 tablet, Rfl: 11   insulin glargine (LANTUS) 100 UNIT/ML Solostar Pen, Inject 30 Units into the skin daily., Disp: 30 mL, Rfl: 6  insulin lispro (HUMALOG) 100 UNIT/ML KwikPen, Inject 8 Units into the skin 3 (three) times daily with meals., Disp: 15 mL, Rfl: 0   Insulin Pen Needle 32G X 4 MM MISC, USE AS DIRECTED WITH INSULIN PENS, Disp: 100 each, Rfl: 0   levothyroxine (SYNTHROID, LEVOTHROID) 50 MCG tablet, Take 1 tablet (50 mcg total) by mouth daily., Disp: 30 tablet, Rfl: 0   nortriptyline (PAMELOR) 50 MG capsule, Take 50 mg by mouth 2 (two) times daily., Disp: , Rfl:    Omega-3 Fatty Acids (FISH OIL PO), Take 1 capsule by mouth daily., Disp: , Rfl:    pantoprazole (PROTONIX) 40 MG tablet, Take 1 tablet (40 mg total) by mouth daily., Disp:  , Rfl:    predniSONE (DELTASONE) 20 MG tablet, Take 2 tablets (40 mg total) by mouth daily with breakfast., Disp: 8 tablet, Rfl: 0   torsemide (DEMADEX) 20 MG tablet, Take 60-80 mg by mouth  See admin instructions. Take 4 tablets by mouth in the morning, then take 3 tablets by mouth in the afternoon per patient, Disp: , Rfl:    VITAMIN D PO, Take 1 tablet by mouth daily., Disp: , Rfl:  Allergies  Allergen Reactions   Gabapentin Nausea And Vomiting and Other (See Comments)    POSSIBLE SHAKING   Lyrica [Pregabalin] Other (See Comments)    Shaking       Social History   Socioeconomic History   Marital status: Single    Spouse name: Not on file   Number of children: 1   Years of education: 12   Highest education level: 12th grade  Occupational History   Occupation: disabled  Tobacco Use   Smoking status: Every Day    Packs/day: 1.00    Years: 44.00    Pack years: 44.00    Types: E-cigarettes, Cigarettes   Smokeless tobacco: Former    Types: Snuff  Vaping Use   Vaping Use: Former   Devices: 11/26/2018 "stopped months ago"  Substance and Sexual Activity   Alcohol use: Yes    Comment: occ   Drug use: Yes    Types: "Crack" cocaine, Marijuana, Oxycodone   Sexual activity: Not Currently  Other Topics Concern   Not on file  Social History Narrative   ** Merged History Encounter **       Lives in [place], in motel with sister.  They are looking to move but don't have a place to go yet.     Social Determinants of Health   Financial Resource Strain: Low Risk    Difficulty of Paying Living Expenses: Not very hard  Food Insecurity: Not on file  Transportation Needs: Not on file  Physical Activity: Not on file  Stress: Not on file  Social Connections: Not on file  Intimate Partner Violence: Not on file    Physical Exam      Future Appointments  Date Time Provider [place]  08/11/2021  2:10 PM Charlott Rakes, MD [facility] None  08/13/2021  2:15 PM Ricard Dillon, MD [facility]  10/01/2021  2:30 PM [name], MD [facility]  10/21/2021  1:40 PM Larey Dresser, MD [facility] None  12/22/2021  1:00 PM [facility] VASC 3 [facility]     BP 132/60   Pulse 88   Wt 218 lb (98.9 kg)   LMP  (LMP Unknown)   SpO2 90%   BMI 37.42 kg/m   Weight yesterday-? Last visit weight-225  Pt home from hosp-they only kept  her for an overnight.  She still feels terrible, coughing, still nauseated, she had diarrhea yesterday.  She was sent home with antibiotic and prednisone. She will be done with antibiotic on Saturday morning dose.  Her losartan has fallen off her list. Will get her sch in heart clinic for f/u hopefully next week and will get this addressed.   Refills needed--sensor for meter Nortriptyline  Will get pharmacy to fill those.   She does a wheeze present, but that is not abnormal for her.  Her RA 02 sat was 90-92%, I asked her to walk about 10 ft and it dropped to 88%.  Sent message to PCP office noting this as she is being seen next week.  Also waiting to hear back on sch her heart clinic visit.   Marylouise Stacks, [facility] Paramedic  08/05/21

## 2021-08-06 NOTE — Telephone Encounter (Signed)
I have ordered a chest x-ray to exclude pneumonia prior to her visit.  Once I receive her results I will be in touch.

## 2021-08-09 ENCOUNTER — Telehealth (HOSPITAL_COMMUNITY): Payer: Self-pay

## 2021-08-09 ENCOUNTER — Ambulatory Visit: Payer: Medicare (Managed Care) | Admitting: Family Medicine

## 2021-08-09 NOTE — Telephone Encounter (Signed)
I contacted pt regarding upcoming visit with PCP this week. Pt reports she isnt feeling well, she has been belching really bad and then she vomits. She also reported having abd pain and black tarry stool for past 3 days as well. She said no BM today yet.  I advised her that is a sign of GI bleed and needed to be evaluated at the ER, she said she was going to wait until she has next BM before deciding to go or not.  She goes to PCP Wednesday and I also sent message to case manager to let them know of these symptoms as well.   Marylouise Stacks, EMT-Paramedic 08/09/21

## 2021-08-09 NOTE — Telephone Encounter (Signed)
This message was received from Marylouise Stacks, EMT today:   I called her today to let her know of her upcoming appointments and she reported she has been belching really bad past 3 days and then she vomits and also she has had black, tarry stools for past 3 days as well with abd pains. I advised her to go to ER for eval. She said she was going to wait and see how her next BM is before she goes, so I am not sure if she will go or not.    This CM called patient and she explained that she has not had a bowel movement today but yesterday it was black. She also noted that she can't keep any food/liquid down, she has only had coffee today and vomited and she said she felt like she was going to vomit when she was on the phone. She also said that she understands when she would need to go to ED for assessment.   Informed her that Dr Margarita Rana has ordered a chest xray for her and she said she will try to go tomorrow, her sister can drive her.

## 2021-08-10 ENCOUNTER — Emergency Department (HOSPITAL_COMMUNITY)
Admission: EM | Admit: 2021-08-10 | Discharge: 2021-08-10 | Disposition: A | Discharge status: Home / Self Care | Payer: Medicare Other | Attending: Emergency Medicine | Admitting: Emergency Medicine

## 2021-08-10 ENCOUNTER — Emergency Department (HOSPITAL_COMMUNITY): Payer: Medicare Other

## 2021-08-10 DIAGNOSIS — J449 Chronic obstructive pulmonary disease, unspecified: Secondary | ICD-10-CM | POA: Diagnosis not present

## 2021-08-10 DIAGNOSIS — N184 Chronic kidney disease, stage 4 (severe): Secondary | ICD-10-CM | POA: Insufficient documentation

## 2021-08-10 DIAGNOSIS — Z7902 Long term (current) use of antithrombotics/antiplatelets: Secondary | ICD-10-CM | POA: Insufficient documentation

## 2021-08-10 DIAGNOSIS — I2510 Atherosclerotic heart disease of native coronary artery without angina pectoris: Secondary | ICD-10-CM | POA: Diagnosis not present

## 2021-08-10 DIAGNOSIS — F17210 Nicotine dependence, cigarettes, uncomplicated: Secondary | ICD-10-CM | POA: Diagnosis not present

## 2021-08-10 DIAGNOSIS — Z20822 Contact with and (suspected) exposure to covid-19: Secondary | ICD-10-CM | POA: Diagnosis not present

## 2021-08-10 DIAGNOSIS — E1122 Type 2 diabetes mellitus with diabetic chronic kidney disease: Secondary | ICD-10-CM | POA: Insufficient documentation

## 2021-08-10 DIAGNOSIS — R112 Nausea with vomiting, unspecified: Secondary | ICD-10-CM | POA: Insufficient documentation

## 2021-08-10 DIAGNOSIS — I5043 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) and diastolic (congestive) heart failure: Secondary | ICD-10-CM | POA: Diagnosis not present

## 2021-08-10 DIAGNOSIS — E039 Hypothyroidism, unspecified: Secondary | ICD-10-CM | POA: Insufficient documentation

## 2021-08-10 DIAGNOSIS — Z79899 Other long term (current) drug therapy: Secondary | ICD-10-CM | POA: Diagnosis not present

## 2021-08-10 DIAGNOSIS — Z7901 Long term (current) use of anticoagulants: Secondary | ICD-10-CM | POA: Diagnosis not present

## 2021-08-10 DIAGNOSIS — R059 Cough, unspecified: Secondary | ICD-10-CM | POA: Diagnosis not present

## 2021-08-10 DIAGNOSIS — I130 Hypertensive heart and chronic kidney disease with heart failure and stage 1 through stage 4 chronic kidney disease, or unspecified chronic kidney disease: Secondary | ICD-10-CM | POA: Diagnosis not present

## 2021-08-10 DIAGNOSIS — Z794 Long term (current) use of insulin: Secondary | ICD-10-CM | POA: Diagnosis not present

## 2021-08-10 LAB — CBC WITH DIFFERENTIAL/PLATELET
Abs Immature Granulocytes: 0.06 10*3/uL (ref 0.00–0.07)
Basophils Absolute: 0 10*3/uL (ref 0.0–0.1)
Basophils Relative: 0 %
Eosinophils Absolute: 0.2 10*3/uL (ref 0.0–0.5)
Eosinophils Relative: 2 %
HCT: 38.1 % (ref 36.0–46.0)
Hemoglobin: 11.6 g/dL — ABNORMAL LOW (ref 12.0–15.0)
Immature Granulocytes: 1 %
Lymphocytes Relative: 19 %
Lymphs Abs: 1.9 10*3/uL (ref 0.7–4.0)
MCH: 26.4 pg (ref 26.0–34.0)
MCHC: 30.4 g/dL (ref 30.0–36.0)
MCV: 86.6 fL (ref 80.0–100.0)
Monocytes Absolute: 0.9 10*3/uL (ref 0.1–1.0)
Monocytes Relative: 9 %
Neutro Abs: 7 10*3/uL (ref 1.7–7.7)
Neutrophils Relative %: 69 %
Platelets: 388 10*3/uL (ref 150–400)
RBC: 4.4 MIL/uL (ref 3.87–5.11)
RDW: 16.1 % — ABNORMAL HIGH (ref 11.5–15.5)
WBC: 10.1 10*3/uL (ref 4.0–10.5)
nRBC: 0 % (ref 0.0–0.2)

## 2021-08-10 LAB — LIPASE, BLOOD: Lipase: 31 U/L (ref 11–51)

## 2021-08-10 LAB — URINALYSIS, ROUTINE W REFLEX MICROSCOPIC
Bacteria, UA: NONE SEEN
Bilirubin Urine: NEGATIVE
Glucose, UA: >=500 mg/dL — AB
Hgb urine dipstick: NEGATIVE
Ketones, ur: NEGATIVE mg/dL
Leukocytes,Ua: NEGATIVE
Nitrite: NEGATIVE
Protein, ur: NEGATIVE mg/dL
Specific Gravity, Urine: 1.011 (ref 1.005–1.030)
pH: 6 (ref 5.0–8.0)

## 2021-08-10 LAB — COMPREHENSIVE METABOLIC PANEL
ALT: 30 U/L (ref 0–44)
AST: 28 U/L (ref 15–41)
Albumin: 3 g/dL — ABNORMAL LOW (ref 3.5–5.0)
Alkaline Phosphatase: 158 U/L — ABNORMAL HIGH (ref 38–126)
Anion gap: 12 (ref 5–15)
BUN: 32 mg/dL — ABNORMAL HIGH (ref 6–20)
CO2: 31 mmol/L (ref 22–32)
Calcium: 8.7 mg/dL — ABNORMAL LOW (ref 8.9–10.3)
Chloride: 93 mmol/L — ABNORMAL LOW (ref 98–111)
Creatinine, Ser: 1.57 mg/dL — ABNORMAL HIGH (ref 0.44–1.00)
GFR, Estimated: 38 mL/min — ABNORMAL LOW (ref >60)
Glucose, Bld: 350 mg/dL — ABNORMAL HIGH (ref 70–99)
Potassium: 3.6 mmol/L (ref 3.5–5.1)
Sodium: 136 mmol/L (ref 135–145)
Total Bilirubin: 0.8 mg/dL (ref 0.3–1.2)
Total Protein: 6.5 g/dL (ref 6.5–8.1)

## 2021-08-10 LAB — RESP PANEL BY RT-PCR (FLU A&B, COVID) ARPGX2
Influenza A by PCR: NEGATIVE
Influenza B by PCR: NEGATIVE
SARS Coronavirus 2 by RT PCR: NEGATIVE

## 2021-08-10 MED ORDER — ONDANSETRON 4 MG PO TBDP
4.0000 mg | ORAL_TABLET | Freq: Once | ORAL | Status: AC
  Administered 2021-08-10: 4 mg via ORAL
  Filled 2021-08-10: qty 1

## 2021-08-10 MED ORDER — ALBUTEROL SULFATE HFA 108 (90 BASE) MCG/ACT IN AERS: 2.0000 | INHALATION_SPRAY | RESPIRATORY_TRACT | Status: DC | PRN

## 2021-08-10 NOTE — ED Provider Notes (Signed)
[facility] EMERGENCY DEPARTMENT Provider Note   CSN: [id number] Arrival date & time: 08/10/21  1343     History Chief Complaint  Patient presents with   Emesis   Nausea    [name] is a 60 y.o. female.  60 y.o female with an extensive PMH including alcohol abuse, cardiomyopathy, CAD, CHF, DVT, diabetes presents to the ED with a chief complaint of nausea, vomiting for the past 3 days. Patient reports she has not been able to keep anything down in the last couple of days. She also reports increase cough and sneezing that has been ongoing for the last couple of days. She reports being instructed by her PCP to be evaluated in the ED for pneumonia, as she had similar symptoms in the past. She has tried milk of magnesia, with some improvement in her symptoms. She has not had any episodes of vomiting since her arrival in the ED. No fever, no chest pain, no shortness of breath. Does reports a tobacco history.   The history is provided by the patient.  Emesis Severity:  Mild Duration:  3 days Timing:  Constant Number of daily episodes:  4 Ineffective treatments:  Antiemetics Associated symptoms: cough   Associated symptoms: no abdominal pain, no chills, no diarrhea, no fever and no headaches       Past Medical History:  Diagnosis Date   Alcohol abuse    Alcoholic cirrhosis ([place])    Anemia    Anxiety    Arthritis    "knees" (11/26/2018)   B12 deficiency    CAD (coronary artery disease)    a. 11/10/2014 Cath: LM nl, LAD min irregs, D1 30 ost, D2 50d, LCX 34m OM1 80 p/m (1.5 mm vessel), OM2 443mRCA nondom 9068mmed rx.. Demand ischemia in the setting of rapid a-fib.   Cardiomyopathy (HCCEverton  Carotid artery disease (HCCKemah  a. 01/2015 Carotid Angio: RICA [id number]. [id number] L CEA; c. 05/2019 Carotid U/S: RICA 100. RECA >50. LICA [id number] Cellulitis    lower extremities   CHF (congestive heart failure) ([name])    Chronic combined systolic and diastolic  CHF (congestive heart failure) (HCCHollister  a. 10/2014 Echo: EF 40-45%; b. 10/2018 Echo: EF 45-50%, gr2 DD; c. 11/2019 Echo: EF 50%, mild LVH, gr2 DD (restrictive), antlat HK, Nl RV fxn. Mild BAE. RVSP 59.9mm57m   CKD (chronic kidney disease), stage III ([name])    Cocaine abuse ([name])    COPD (chronic obstructive pulmonary disease) ([name])    Critical lower limb ischemia ([name])    Depression    Diabetes mellitus without complication ([name])    Diabetic peripheral neuropathy ([name])    DVT (deep venous thrombosis) ([name])    Dyspnea    Elevated troponin    a. Chronic elevation.   GERD (gastroesophageal reflux disease)    Hyperlipemia    Hypertension    Hypokalemia    Hypomagnesemia    Hypothyroidism    Marijuana abuse    Narcotic abuse ([name])Hazard Noncompliance    NSVT (nonsustained ventricular tachycardia)    Obesity    PAF (paroxysmal atrial fibrillation) ([name])    Paroxysmal atrial tachycardia ([name])    Peripheral arterial disease ([name])[place] a. 01/2015 Angio/PTA: RSFA 99 (atherectomy/pta) - 1 vessel runoff via diff dzs peroneal; b. 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   Pneumonia    "  once or twice" (11/26/2018)   Poorly controlled type 2 diabetes mellitus ([place])    Renal disorder    Renal insufficiency    a. 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   Sleep apnea    "couldn't handle wearing the mask" (11/26/2018)   Symptomatic bradycardia    a. Avoid AV blocking agent per EP. Prev req temp wire in 2017.   Tobacco abuse     Patient Active Problem List   Diagnosis Date Noted   Hyperlipidemia associated with type 2 diabetes mellitus ([place]) 07/29/2021   (HFpEF) heart failure with preserved ejection fraction ([place]) 07/29/2021   Bradycardia 06/13/2021   Uncontrolled type 2 diabetes mellitus with hyperglycemia ([place]) 06/13/2021   Acute kidney injury superimposed on CKD ([place]) 06/13/2021   Critical ischemia of foot ([place]) 06/07/2021   Fall 05/05/2021   PAF (paroxysmal atrial fibrillation) ([place])  05/05/2021   COPD (chronic obstructive pulmonary disease) ([place]) 05/05/2021   DM (diabetes mellitus), type 2 with complications ([place]) [id number]   PAD (peripheral artery disease) ([place]) 05/05/2021   Cellulitis 05/05/2021   Acute on chronic combined systolic and diastolic CHF (congestive heart failure) ([place]) 05/05/2021   OSA (obstructive sleep apnea) 05/05/2021   CKD (chronic kidney disease) stage 3, GFR 30-59 ml/min ([place]) 05/05/2021   AKI (acute kidney injury) ([place]) 05/04/2021   Pressure injury of skin 05/04/2021   Ulcer of foot, unspecified laterality, limited to breakdown of skin ([place]) 02/17/2021   Altered mental status 01/02/2021   AF (paroxysmal atrial fibrillation) ([place]) 01/02/2021   CAD (coronary artery disease) 01/02/2021   PVD (peripheral vascular disease) ([place]) 01/02/2021   Acute renal failure superimposed on stage 4 chronic kidney disease ([place]) 01/02/2021   Diabetes mellitus type 2, uncontrolled, with complications [id number]   Diabetic nephropathy ([place]) 01/02/2021   Low back pain 06/01/2020   Hyperlipidemia 04/03/2020   Muscle weakness 03/20/2020   Closed fracture of neck of right radius 03/13/2020   Pain in elbow 03/12/2020   PAD (peripheral artery disease) ([place]) 12/26/2019   Acute renal failure ([name])    Acute respiratory failure with hypoxia ([place]) 12/02/2019   Acute exacerbation of CHF (congestive heart failure) ([place]) 12/01/2019   Cellulitis in diabetic foot ([place]) 07/08/2019   Osteomyelitis ([place]) 06/21/2019   NICM (nonischemic cardiomyopathy) ([place]) 06/20/2019   Non-healing ulcer ([place]) 06/20/2019   Acute CHF (congestive heart failure) ([place]) 11/26/2018   CHF (congestive heart failure) ([place]) 11/26/2018   Acute respiratory failure ([place]) 10/21/2018   AKI (acute kidney injury) ([place]) 08/18/2018   Coagulation disorder ([place]) 08/09/2017   Depression 07/21/2017   At risk for adverse drug reaction 06/20/2017   Peripheral neuropathy 06/20/2017   Acute osteomyelitis of right foot  ([place]) 06/13/2017   S/P transmetatarsal amputation of foot, right ([place]) 06/05/2017   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 ([place]) 05/19/2017   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[place])    SIRS (systemic inflammatory response syndrome) ([place]) 04/06/2017   CKD (chronic kidney disease), stage III ([place]) 11/24/2016   Suspected sleep apnea 11/24/2016   Ulcer of toe of right foot, with necrosis of bone ([place]) 10/27/2016   Ulcer of left lower leg ([place]) 05/19/2016   Severe obesity (BMI >= 40) ([place]) 02/24/2016   COPD GOLD 0  02/24/2016   Morbid obesity ([place]) 02/24/2016   Encounter for therapeutic drug monitoring 02/10/2016   Symptomatic bradycardia 01/12/2016   Hypertension associated with diabetes ([place]) 12/22/2015   Chronic combined systolic and diastolic CHF (congestive heart failure) ([place])  [place]    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 11/08/2015   Tobacco abuse 10/23/2015   Coronary artery disease    DOE (dyspnea on exertion) 04/29/2015   Diabetes mellitus type 2, uncontrolled 02/08/2015   Influenza A 02/07/2015   Wrist fracture, left, with routine healing, subsequent encounter 02/05/2015   Wrist fracture, left, closed, initial encounter 01/29/2015   PAF (paroxysmal atrial fibrillation) ([place]) 01/16/2015   Carotid arterial disease ([place]) 01/16/2015   Claudication ([place]) 01/15/2015   Demand ischemia ([place]) 10/29/2014   Insomnia 02/03/2014   COPD with acute exacerbation ([place]) 02/01/2014   S/P peripheral artery angioplasty - [place]bo[place] atherectomy; R SFA 09/11/2013    Class: Acute   Leg pain, bilateral 08/19/2013   Hypothyroidism 07/31/2013   Cellulitis 06/13/2013   History of cocaine abuse ([place]) 06/13/2013   Long term current use of anticoagulant therapy 05/20/2013   Alcohol abuse    Narcotic abuse ([place])    Marijuana abuse    Alcoholic cirrhosis ([name])    DM (diabetes mellitus), type 2 with peripheral vascular complications [facility])     Past Surgical  History:  Procedure Laterality Date   ABDOMINAL AORTOGRAM N/A 06/26/2019   Procedure: ABDOMINAL AORTOGRAM;  Surgeon: Wellington Hampshire, MD;  Location: [place] CV LAB;  Service: Cardiovascular;  Laterality: N/A;   ABDOMINAL AORTOGRAM W/LOWER EXTREMITY N/A 06/07/2021   Procedure: ABDOMINAL AORTOGRAM W/LOWER EXTREMITY;  Surgeon: [name], MD;  Location: [place] CV LAB;  Service: Cardiovascular;  Laterality: N/A;   AMPUTATION Right 06/14/2017   Procedure: Right foot transmetatarsal amputation;  Surgeon: Newt Minion, MD;  Location: [place];  Service: Orthopedics;  Laterality: Right;   AMPUTATION Left 06/28/2019   Procedure: AMPUTATION LEFT FIFTH TOE;  Surgeon: Rosetta Posner, MD;  Location: [place];  Service: Vascular;  Laterality: Left;   AMPUTATION TOE Right 04/28/2017   Procedure: AMPUTATION OF RIGHT SECOND RAY;  Surgeon: Newt Minion, MD;  Location: [place];  Service: Orthopedics;  Laterality: Right;   CARDIAC CATHETERIZATION     CARDIAC CATHETERIZATION N/A 01/12/2016   Procedure: Temporary Wire;  Surgeon: Minus Breeding, MD;  Location: [place] CV LAB;  Service: Cardiovascular;  Laterality: N/A;   CARDIOVERSION  ~ 02/2013   "twice"    CAROTID ANGIOGRAM N/A 01/15/2015   Procedure: CAROTID ANGIOGRAM;  Surgeon: [name], MD;  Location: [facility] CATH LAB;  Service: Cardiovascular;  Laterality: N/A;   DILATION AND CURETTAGE OF UTERUS  1988   ENDARTERECTOMY Left 02/19/2015   Procedure: LEFT CAROTID ENDARTERECTOMY ;  Surgeon: Serafina Mitchell, MD;  Location: [place];  Service: Vascular;  Laterality: Left;   FEMORAL-TIBIAL BYPASS GRAFT Left 06/28/2019   Procedure: BYPASS GRAFT LEFT LEG FEMORAL TO ANTERIOR TIBIAL ARTERY using LEFT GREATER SAPHENOUS VEIN;  Surgeon: Rosetta Posner, MD;  Location: [place];  Service: Vascular;  Laterality: Left;   LEFT HEART CATHETERIZATION WITH CORONARY ANGIOGRAM N/A 10/31/2014   Procedure: LEFT HEART CATHETERIZATION WITH CORONARY ANGIOGRAM;  Surgeon: Burnell Blanks, MD;  Location: [name] CATH LAB;  Service: Cardiovascular;  Laterality: N/A;   LOWER EXTREMITY ANGIOGRAM N/A 09/10/2013   Procedure: LOWER EXTREMITY ANGIOGRAM;  Surgeon: [name], MD;  Location: [facility] C[facility] LAB;  Service: Cardiovascular;  Laterality: N/A;   LOWER EXTREMITY ANGIOGRAM N/A 01/15/2015   Procedure: LOWER EXTREMITY ANGIOGRAM;  Surgeon: [name], MD;  Location: [facility] CATH LAB;  Service: Cardiovascular;  Laterality: N/A;   LOWER EXTREMITY ANGIOGRAPHY N/A 04/13/2017   Procedure: Lower Extremity Angiography;  Surgeon: [name], MD;  Location: [place] CV LAB;  Service: Cardiovascular;  Laterality: N/A;   LOWER EXTREMITY ANGIOGRAPHY Left 06/26/2019   Procedure: LOWER EXTREMITY ANGIOGRAPHY;  Surgeon: Wellington Hampshire, MD;  Location: [place] CV LAB;  Service: Cardiovascular;  Laterality: Left;   PERIPHERAL VASCULAR ATHERECTOMY Right 06/07/2021   Procedure: PERIPHERAL VASCULAR ATHERECTOMY;  Surgeon: [name], MD;  Location: [place] CV LAB;  Service: Cardiovascular;  Laterality: Right;   PERIPHERAL VASCULAR BALLOON ANGIOPLASTY Left 06/26/2019   Procedure: PERIPHERAL VASCULAR BALLOON ANGIOPLASTY;  Surgeon: Wellington Hampshire, MD;  Location: [place] CV LAB;  Service: Cardiovascular;  Laterality: Left;  unable to cross lt sfa occlusion   PERIPHERAL VASCULAR INTERVENTION  04/13/2017   Procedure: Peripheral Vascular Intervention;  Surgeon: [name], MD;  Location: [place] CV LAB;  Service: Cardiovascular;;   PRESSURE SENSOR/CARDIOMEMS N/A 02/05/2020   Procedure: PRESSURE SENSOR/CARDIOMEMS;  Surgeon: Larey Dresser, MD;  Location: [place] CV LAB;  Service: Cardiovascular;  Laterality: N/A;   VEIN HARVEST Left 06/28/2019   Procedure: LEFT LEG GREATER SAPHENOUS VEIN HARVEST;  Surgeon: Rosetta Posner, MD;  Location: [facility] OR;  Service: Vascular;  Laterality: Left;     OB History     Gravida  1   Para  1   Term  1   Preterm  0   AB  0    Living         SAB  0   IAB  0   Ectopic  0   Multiple      Live Births              Family History  Problem Relation Age of Onset   Hypertension Mother    Diabetes Mother    Cancer Mother        breast, ovarian, colon   Clotting disorder Mother    Heart disease Mother    Heart attack Mother    Breast cancer Mother        in 21's   Hypertension Father    Heart disease Father    Emphysema Sister        smoked    Social History   Tobacco Use   Smoking status: Every Day    Packs/day: 1.00    Years: 44.00    Pack years: 44.00    Types: E-cigarettes, Cigarettes   Smokeless tobacco: Former    Types: Snuff  Vaping Use   Vaping Use: Former   Devices: 11/26/2018 "stopped months ago"  Substance Use Topics   Alcohol use: Yes    Comment: occ   Drug use: Yes    Types: "Crack" cocaine, Marijuana, Oxycodone    Home Medications Prior to Admission medications   Medication Sig Start Date End Date Taking? Authorizing Provider  ACCU-CHEK GUIDE test strip USE TO CHECK BLOOD SUGAR FOUR TIMES DAILY 02/25/20   [provider]  acetaminophen (TYLENOL) 500 MG tablet Take 500 mg by mouth every 6 (six) hours as needed for moderate pain.    [provider]  albuterol (VENTOLIN HFA) 108 (90 Base) MCG/ACT inhaler Inhale 2 puffs into the lungs every 6 (six) hours as needed for wheezing or shortness of breath. 07/30/21   Danford, Suann Larry, MD  allopurinol (ZYLOPRIM) 100 MG tablet Take 1 tablet (100 mg total) by mouth daily. 08/12/21   Charlott Rakes, MD  amiodarone (PACERONE) 200 MG tablet Take 0.5 tablets (100 mg total) by mouth daily. 10/15/20  Larey Dresser, MD  amLODipine (NORVASC) 10 MG tablet Take 1 tablet (10 mg total) by mouth daily. 10/15/20   Larey Dresser, MD  apixaban (ELIQUIS) 5 MG TABS tablet Take 5 mg by mouth 2 (two) times daily.    [provider]  atorvastatin (LIPITOR) 40 MG tablet Take 40 mg by mouth every evening.    [provider]  benzonatate (TESSALON) 100 MG capsule Take 1 capsule (100 mg total) by mouth 2 (two) times daily as needed for cough. 08/11/21   Charlott Rakes, MD  budesonide-formoterol (SYMBICORT) 160-4.5 MCG/ACT inhaler Inhale 2 puffs into the lungs 2 (two) times daily. 12/09/19   Eugenie Filler, MD  clopidogrel (PLAVIX) 75 MG tablet Take 1 tablet (75 mg total) by mouth daily with breakfast. 06/09/21   Reino Bellis B, NP  colchicine 0.6 MG tablet Take 2 tabs (1.2 mg) at the onset of a gout flare, may take 1 tab (0.6 mg) in 1 hour if symptoms 08/11/21   Charlott Rakes, MD  Continuous Blood Gluc Sensor (FREESTYLE LIBRE 2 SENSOR) MISC USE 1 (ONE) EACH EVERY 2 WEEKS 07/14/21   Charlott Rakes, MD  Dulaglutide (TRULICITY) 1.5 0000000 SOPN Inject 1.5 mg into the skin once a week. 08/11/21   Charlott Rakes, MD  empagliflozin (JARDIANCE) 10 MG TABS tablet Take 1 tablet (10 mg total) by mouth daily before breakfast. 04/01/21   Clegg, [name] D, NP  FLUoxetine (PROZAC) 20 MG capsule Take 1 capsule (20 mg total) by mouth at bedtime. 07/14/21   Charlott Rakes, MD  folic acid (FOLVITE) 1 MG tablet Take 1 tablet (1 mg total) by mouth daily. 07/14/21   Charlott Rakes, MD  hydrALAZINE (APRESOLINE) 50 MG tablet Take 1 tablet (50 mg total) by mouth 3 (three) times daily. 08/11/21   Charlott Rakes, MD  insulin glargine (LANTUS) 100 UNIT/ML Solostar Pen Inject 30 Units into the skin daily. 07/14/21 07/14/22  Charlott Rakes, MD  insulin lispro (HUMALOG) 100 UNIT/ML KwikPen Inject 8 Units into the skin 3 (three) times daily with meals. 01/05/21   Allie Bossier, MD  Insulin Pen Needle 32G X 4 MM MISC USE AS DIRECTED WITH INSULIN PENS 01/05/21 01/05/22  Allie Bossier, MD  levothyroxine (SYNTHROID, LEVOTHROID) 50 MCG tablet Take 1 tablet (50 mcg total) by mouth daily. 08/09/17   Medina-Vargas, Monina C, NP  losartan (COZAAR) 25 MG tablet Take 2 tablets (50 mg total) by mouth daily. 08/16/21 11/14/21  Rafael Bihari, FNP   Misc. Devices MISC 2 L oxygen via nasal cannula.  Diagnosis chronic respiratory failure with hypoxia Patient not taking: Reported on 08/16/2021 08/11/21   Charlott Rakes, MD  nortriptyline (PAMELOR) 25 MG capsule Take 1 capsule (25 mg total) by mouth at bedtime. For 2 weeks then taper down to every other day for 2-weeks 08/12/21   Charlott Rakes, MD  Omega-3 Fatty Acids (FISH OIL PO) Take 1 capsule by mouth daily.    [provider]  ondansetron (ZOFRAN) 4 MG tablet Take 1 tablet (4 mg total) by mouth every 8 (eight) hours as needed for nausea or vomiting. 08/11/21   Charlott Rakes, MD  pantoprazole (PROTONIX) 40 MG tablet Take 1 tablet (40 mg total) by mouth daily. 02/21/20   Larey Dresser, MD  predniSONE (DELTASONE) 20 MG tablet Take 2 tablets (40 mg total) by mouth daily with breakfast. 08/11/21   Charlott Rakes, MD  torsemide (DEMADEX) 20 MG tablet Take 60-80 mg by mouth See admin instructions.  Take 4 tablets by mouth in the morning, then take 3 tablets by mouth in the afternoon per patient    [provider]  VITAMIN D PO Take 1 tablet by mouth daily.    [provider]  tiotropium (SPIRIVA HANDIHALER) 18 MCG inhalation capsule Place 1 capsule (18 mcg total) into inhaler and inhale every morning. Patient not taking: Reported on 02/09/2021 12/09/19 02/09/21  Eugenie Filler, MD    Allergies    Gabapentin and Lyrica [pregabalin]  Review of Systems   Review of Systems  Constitutional:  Negative for chills and fever.  Respiratory:  Positive for cough. Negative for shortness of breath.   Cardiovascular:  Negative for chest pain.  Gastrointestinal:  Positive for nausea and vomiting. Negative for abdominal pain, constipation and diarrhea.  Genitourinary:  Positive for frequency. Negative for flank pain.  Musculoskeletal:  Negative for back pain.  Skin:  Negative for pallor and wound.  Neurological:  Negative for light-headedness and headaches.  All other systems  reviewed and are negative.  Physical Exam Updated Vital Signs BP (!) 162/78   Pulse 90   Temp 98.5 F (36.9 C) (Oral)   Resp 18   LMP  (LMP Unknown)   SpO2 94%   Physical Exam Vitals and nursing note reviewed.  Constitutional:      General: She is not in acute distress.    Appearance: She is well-developed.  HENT:     Head: Normocephalic and atraumatic.  Eyes:     Conjunctiva/sclera: Conjunctivae normal.  Cardiovascular:     Rate and Rhythm: Normal rate and regular rhythm.     Heart sounds: No murmur heard. Pulmonary:     Effort: Pulmonary effort is normal.     Breath sounds: Normal breath sounds.     Comments: Lungs are clear to auscultation.  Abdominal:     General: Abdomen is flat.     Palpations: Abdomen is soft.     Tenderness: There is no abdominal tenderness.     Comments: Abdomen is soft, non tender to palpation.   Musculoskeletal:     Cervical back: Neck supple.  Skin:    General: Skin is warm and dry.  Neurological:     Mental Status: She is alert and oriented to person, place, and time.    ED Results / Procedures / Treatments   Labs (all labs ordered are listed, but only abnormal results are displayed) Labs Reviewed  URINE CULTURE - Abnormal; Notable for the following components:      Result Value   Culture MULTIPLE SPECIES PRESENT, SUGGEST RECOLLECTION (*)    All other components within normal limits  CBC WITH DIFFERENTIAL/PLATELET - Abnormal; Notable for the following components:   Hemoglobin 11.6 (*)    RDW 16.1 (*)    All other components within normal limits  COMPREHENSIVE METABOLIC PANEL - Abnormal; Notable for the following components:   Chloride 93 (*)    Glucose, Bld 350 (*)    BUN 32 (*)    Creatinine, Ser 1.57 (*)    Calcium 8.7 (*)    Albumin 3.0 (*)    Alkaline Phosphatase 158 (*)    GFR, Estimated 38 (*)    All other components within normal limits  URINALYSIS, ROUTINE W REFLEX MICROSCOPIC - Abnormal; Notable for the following  components:   Glucose, UA >=500 (*)    All other components within normal limits  RESP PANEL BY RT-PCR (FLU A&B, COVID) ARPGX2  LIPASE, BLOOD  EKG EKG Interpretation  Date/Time:  Tuesday August 10 2021 21:15:55 EDT Ventricular Rate:  85 PR Interval:  166 QRS Duration: 92 QT Interval:  450 QTC Calculation: 535 R Axis:   23 Text Interpretation: Duplicate Confirmed by Calvert Cantor [phone]) on 08/10/2021 9:19:52 PM  Radiology No results found.  Procedures Procedures   Medications Ordered in ED Medications  ondansetron (ZOFRAN-ODT) disintegrating tablet 4 mg (4 mg Oral Given 08/10/21 2019)    ED Course  I have reviewed the triage vital signs and the nursing notes.  Pertinent labs & imaging results that were available during my care of the patient were reviewed by me and considered in my medical decision making (see chart for details).    MDM Rules/Calculators/A&P    Patient with a PMH of Alcohol Abuse, cardiomyopathy, CAD presents to the ED with a nausea and vomiting x 3 days. No outpatient meds have been trial. She was advised by PCP to go to the ED to r/o pneumonia and obtain chest xray.   During primary evaluation patient is overall nontoxic-appearing, vitals are within normal limits.  Lungs are clear to auscultation, abdomen is soft nontender to palpation she is without any rales or wheezing noted.  No pitting edema, no calf tenderness.  In addition she is also endorsing some urinary frequency, we will add a culture.  Labs on today's visit with a CMP without any electrolyte normality, creatinine level slightly elevated, LFTs are within normal limits.  CBC with no leukocytosis, hemoglobin slightly decreased.  UA with no nitrates, leukocytes, red blood cell count.  Lipase level was normal.  She was negative for COVID-19, influenza A, influenza B.  XRAY without any acute findings at this time.  She was given zofran, she has been drinking adequately.  Serial abdominal  exams were performed, she continues to have any vomiting or nausea.  We discussed results of her x-ray, along with her blood work.  She understood she will need to follow-up with PCP.  Return precautions were discussed at length, patient is stable for discharge.  Portions of this note were generated with Lobbyist. Dictation errors may occur despite best attempts at proofreading.  Final Clinical Impression(s) / ED Diagnoses Final diagnoses:  Nausea and vomiting, intractability of vomiting not specified, unspecified vomiting type    Rx / DC Orders ED Discharge Orders     None        Janeece Fitting, PA-C 08/19/21 0754    Truddie Hidden, MD 08/19/21 [phone]

## 2021-08-10 NOTE — Discharge Instructions (Addendum)
Your laboratory results were within normal limits.  You will need to follow up with your primary care physician.    You were provided with a copy of your xray.

## 2021-08-10 NOTE — ED Triage Notes (Signed)
Pt also advises hx COPD (gold), CHF, CKD, states compliance w all home medications

## 2021-08-10 NOTE — ED Provider Notes (Signed)
Emergency Medicine Provider Triage Evaluation Note  [name] , a 60 y.o. female  was evaluated in triage.  Pt complains of nausea and vomiting for the past 3 days.  Patient has been regularly nauseous and has vomited 4 times.  Denies any blood noted in her vomit.  Patient says she has had decreased appetite and not able to keep food down.  She has had increased indigestion.  She denies any diarrhea or fevers.  She has had regular bowel movements.  She did note some black tarry stores stools about 2 days ago that was after taking Pepto-Bismol.  This does not happen again.  She does have shortness of breath which is her baseline.  She denies any chest pain or back pain.  Of Note, she is on blood thinners.  Review of Systems  Positive: Nausea, vomiting Negative: Fever, chills, chest pain, constipation, diarrhea, hematemesis  Physical Exam  BP (!) 156/73 (BP Location: Right Arm)   Pulse 83   Temp 98.5 F (36.9 C) (Oral)   Resp 16   LMP  (LMP Unknown)   SpO2 100%  Gen:   Awake, no distress  Resp:  Normal effort.  Coarse lung sounds in bilateral upper lung fields.  MSK:   Moves extremities without difficulty  Other:  Heart regular rate and rhythm with no murmurs.  No tenderness to palpation on abdominal exam.  No masses noted.  Patient appears well with no acute distress.  Medical Decision Making  Medically screening exam initiated at 3:34 PM.  Appropriate orders placed.  JEANNIA HEINZELMAN was informed that the remainder of the evaluation will be completed by another provider, this initial triage assessment does not replace that evaluation, and the importance of remaining in the ED until their evaluation is complete.     Adolphus Birchwood, PA-C 08/10/21 1538    Lennice Sites, DO 08/10/21 1559

## 2021-08-10 NOTE — Telephone Encounter (Signed)
Noted.  She is currently in ED.

## 2021-08-10 NOTE — ED Notes (Signed)
Pt placed on 2L of oxygen when asleep EDP aware, Pt states that she has been recommended by PCP to wear a CPAP at night but is noncompliant

## 2021-08-10 NOTE — ED Triage Notes (Signed)
Pt c/o feeling "sick" x2wks, states last 3 days she's felt nauseous, cannot tolerate PO. States that home paramedic wanted her to get a cxr, advises she is here for same.

## 2021-08-10 NOTE — Telephone Encounter (Signed)
If symptoms are this severe, she would need to go to the ED

## 2021-08-11 ENCOUNTER — Ambulatory Visit: Payer: Medicare Other | Attending: Family Medicine | Admitting: Family Medicine

## 2021-08-11 ENCOUNTER — Encounter: Payer: Self-pay | Admitting: Family Medicine

## 2021-08-11 ENCOUNTER — Other Ambulatory Visit: Payer: Self-pay

## 2021-08-11 VITALS — BP 153/67 | HR 87 | Ht 64.0 in | Wt 214.0 lb

## 2021-08-11 DIAGNOSIS — J449 Chronic obstructive pulmonary disease, unspecified: Secondary | ICD-10-CM | POA: Diagnosis not present

## 2021-08-11 DIAGNOSIS — E1151 Type 2 diabetes mellitus with diabetic peripheral angiopathy without gangrene: Secondary | ICD-10-CM | POA: Diagnosis not present

## 2021-08-11 DIAGNOSIS — R110 Nausea: Secondary | ICD-10-CM | POA: Diagnosis not present

## 2021-08-11 DIAGNOSIS — Z23 Encounter for immunization: Secondary | ICD-10-CM | POA: Diagnosis not present

## 2021-08-11 DIAGNOSIS — I5042 Chronic combined systolic (congestive) and diastolic (congestive) heart failure: Secondary | ICD-10-CM

## 2021-08-11 DIAGNOSIS — M1A0410 Idiopathic chronic gout, right hand, without tophus (tophi): Secondary | ICD-10-CM | POA: Diagnosis not present

## 2021-08-11 DIAGNOSIS — J9611 Chronic respiratory failure with hypoxia: Secondary | ICD-10-CM

## 2021-08-11 DIAGNOSIS — I110 Hypertensive heart disease with heart failure: Secondary | ICD-10-CM

## 2021-08-11 LAB — GLUCOSE, POCT (MANUAL RESULT ENTRY): POC Glucose: 362 mg/dl — AB (ref 70–99)

## 2021-08-11 LAB — POCT GLYCOSYLATED HEMOGLOBIN (HGB A1C): HbA1c, POC (controlled diabetic range): 10.5 % — AB (ref 0.0–7.0)

## 2021-08-11 LAB — URINE CULTURE

## 2021-08-11 MED ORDER — MISC. DEVICES MISC: 0 refills | Status: DC

## 2021-08-11 MED ORDER — TRULICITY 1.5 MG/0.5ML ~~LOC~~ SOAJ: 1.5000 mg | SUBCUTANEOUS | 6 refills | Status: DC

## 2021-08-11 MED ORDER — BENZONATATE 100 MG PO CAPS: 100.0000 mg | ORAL_CAPSULE | Freq: Two times a day (BID) | ORAL | 0 refills | Status: DC | PRN

## 2021-08-11 MED ORDER — HYDRALAZINE HCL 50 MG PO TABS: 50.0000 mg | ORAL_TABLET | Freq: Three times a day (TID) | ORAL | 6 refills | Status: DC

## 2021-08-11 MED ORDER — PREDNISONE 20 MG PO TABS: 40.0000 mg | ORAL_TABLET | Freq: Every day | ORAL | 0 refills | Status: DC

## 2021-08-11 MED ORDER — COLCHICINE 0.6 MG PO TABS: ORAL_TABLET | ORAL | 3 refills | Status: DC

## 2021-08-11 MED ORDER — ONDANSETRON HCL 4 MG PO TABS: 4.0000 mg | ORAL_TABLET | Freq: Three times a day (TID) | ORAL | 0 refills | Status: DC | PRN

## 2021-08-11 NOTE — Progress Notes (Signed)
Subjective:  Patient ID: [name], female    DOB: 06-Nov-1961  Age: 60 y.o. MRN: [id number]  CC: Diabetes   HPI [name] is a 60 y.o. year old female with a history of PAD (status post  LLE bypass drug-coated balloon angioplasty of right SFA secondary to critical right limb ischemia), carotid stenosis s/p left CEA, HFrEF (EF 55 to 60%, grade 2 DD), CAD, paroxysmal A. fib, previous substance abuse, type 2 diabetes mellitus(A1c 10.5), right foot transmetatarsal amputation, stage III CKD, liver cirrhosis, hypothyroidism, COPD, hypertension, Anxiety and Depression, tobacco abuse.  Interval History: Seen at the ED yesterday for nausea and vomiting, decreased appetite, black tarry stools after taking Pepto-Bismol. Chest x-ray from yesterday revealed no acute cardiopulmonary findings. Received an antiemetic which has been helpful and she is able to keep food down.   She still has cough which is slightly productive and she continues to be dyspneic on mild exertion.  Endorses using her albuterol MDI and Symbicort. Room air resting pulse oximetry 91 % Room air pulse oximetry during exertion 80% Pulse oximetry on 2 LPM oxygen via nasal cannula during exertion 100 % Tested by Doloris Hall, CMA.   Complains of pain in her R hand and thinks it might be Gout flare and pain is 8/10; denies presence of erythema  She is currently on colchicine. With regards to her diabetes, her A1c is 10.5.  Trulicity was just restarted at her last office visit and she has been compliant with all her medications. Her blood pressure is also elevated and she endorses compliance with her antihypertensive. Her med list reveals she is on Nortriptyline and she is unaware of the indication.  Plan had been to taper her off amitriptyline and she was advised to reduce from 50 mg twice daily to 25 mg twice daily. Past Medical History:  Diagnosis Date   Alcohol abuse    Alcoholic cirrhosis ([place])    Anemia    Anxiety     Arthritis    "knees" (11/26/2018)   B12 deficiency    CAD (coronary artery disease)    a. 11/10/2014 Cath: LM nl, LAD min irregs, D1 30 ost, D2 50d, LCX 31m OM1 80 p/m (1.5 mm vessel), OM2 454mRCA nondom 9016mmed rx.. Demand ischemia in the setting of rapid a-fib.   Cardiomyopathy ([place]  Carotid artery disease ([place]  a. 01/2015 Carotid Angio: RICA [id number]. [id number] L CEA; c. 05/2019 Carotid U/S: RICA 100. RECA >50. LICA [id number] Cellulitis    lower extremities   CHF (congestive heart failure) ([name])    Chronic combined systolic and diastolic CHF (congestive heart failure) ([place]  a. 10/2014 Echo: EF 40-45%; b. 10/2018 Echo: EF 45-50%, gr2 DD; c. 11/2019 Echo: EF 50%, mild LVH, gr2 DD (restrictive), antlat HK, Nl RV fxn. Mild BAE. RVSP 59.9mm22m   CKD (chronic kidney disease), stage III ([name])    Cocaine abuse ([name])    COPD (chronic obstructive pulmonary disease) ([name])    Critical lower limb ischemia ([name])    Depression    Diabetes mellitus without complication ([name])    Diabetic peripheral neuropathy ([name])    DVT (deep venous thrombosis) ([name])    Dyspnea    Elevated troponin    a. Chronic elevation.   GERD (gastroesophageal reflux disease)    Hyperlipemia    Hypertension    Hypokalemia    Hypomagnesemia    Hypothyroidism    Marijuana  abuse    Narcotic abuse ([place])    Noncompliance    NSVT (nonsustained ventricular tachycardia) ([name])    Obesity    PAF (paroxysmal atrial fibrillation) ([name])    Paroxysmal atrial tachycardia ([name])    Peripheral arterial disease ([place])    a. 01/2015 Angio/PTA: RSFA 99 (atherectomy/pta) - 1 vessel runoff via diff dzs peroneal; b. 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   Pneumonia    "once or twice" (11/26/2018)   Poorly controlled type 2 diabetes mellitus ([place])    Renal disorder    Renal insufficiency    a. 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   Sleep apnea    "couldn't handle wearing the mask" (11/26/2018)   Symptomatic  bradycardia    a. Avoid AV blocking agent per EP. Prev req temp wire in 2017.   Tobacco abuse     Past Surgical History:  Procedure Laterality Date   ABDOMINAL AORTOGRAM N/A 06/26/2019   Procedure: ABDOMINAL AORTOGRAM;  Surgeon: Wellington Hampshire, MD;  Location: [place] [facility];  Service: Cardiovascular;  Laterality: N/A;   ABDOMINAL AORTOGRAM W/LOWER EXTREMITY N/A 06/07/2021   Procedure: ABDOMINAL AORTOGRAM W/LOWER EXTREMITY;  Surgeon: [name], MD;  Location: [place] [facility];  Service: Cardiovascular;  Laterality: N/A;   AMPUTATION Right 06/14/2017   Procedure: Right foot transmetatarsal amputation;  Surgeon: Newt Minion, MD;  Location: [place];  Service: Orthopedics;  Laterality: Right;   AMPUTATION Left 06/28/2019   Procedure: AMPUTATION LEFT FIFTH TOE;  Surgeon: Rosetta Posner, MD;  Location: [place];  Service: Vascular;  Laterality: Left;   AMPUTATION TOE Right 04/28/2017   Procedure: AMPUTATION OF RIGHT SECOND RAY;  Surgeon: Newt Minion, MD;  Location: [place];  Service: Orthopedics;  Laterality: Right;   CARDIAC CATHETERIZATION     CARDIAC CATHETERIZATION N/A 01/12/2016   Procedure: Temporary Wire;  Surgeon: Minus Breeding, MD;  Location: [place] [facility];  Service: Cardiovascular;  Laterality: N/A;   CARDIOVERSION  ~ 02/2013   "twice"    CAROTID ANGIOGRAM N/A 01/15/2015   Procedure: CAROTID ANGIOGRAM;  Surgeon: [name], MD;  Location: [facility] C[facility];  Service: Cardiovascular;  Laterality: N/A;   DILATION AND CURETTAGE OF UTERUS  1988   ENDARTERECTOMY Left 02/19/2015   Procedure: LEFT CAROTID ENDARTERECTOMY ;  Surgeon: Serafina Mitchell, MD;  Location: [place];  Service: Vascular;  Laterality: Left;   FEMORAL-TIBIAL BYPASS GRAFT Left 06/28/2019   Procedure: BYPASS GRAFT LEFT LEG FEMORAL TO ANTERIOR TIBIAL ARTERY using LEFT GREATER SAPHENOUS VEIN;  Surgeon: Rosetta Posner, MD;  Location: [place];  Service: Vascular;  Laterality: Left;   LEFT HEART CATHETERIZATION WITH CORONARY  ANGIOGRAM N/A 10/31/2014   Procedure: LEFT HEART CATHETERIZATION WITH CORONARY ANGIOGRAM;  Surgeon: Burnell Blanks, MD;  Location: [facility] C[facility] LAB;  Service: Cardiovascular;  Laterality: N/A;   LOWER EXTREMITY ANGIOGRAM N/A 09/10/2013   Procedure: LOWER EXTREMITY ANGIOGRAM;  Surgeon: [name], MD;  Location: [facility] C[facility];  Service: Cardiovascular;  Laterality: N/A;   LOWER EXTREMITY ANGIOGRAM N/A 01/15/2015   Procedure: LOWER EXTREMITY ANGIOGRAM;  Surgeon: [name], MD;  Location: [facility] CATH LAB;  Service: Cardiovascular;  Laterality: N/A;   LOWER EXTREMITY ANGIOGRAPHY N/A 04/13/2017   Procedure: Lower Extremity Angiography;  Surgeon: [name], MD;  Location: [place] [facility];  Service: Cardiovascular;  Laterality: N/A;   LOWER EXTREMITY ANGIOGRAPHY Left 06/26/2019   Procedure: LOWER EXTREMITY ANGIOGRAPHY;  Surgeon: Wellington Hampshire, MD;  Location: [place] [facility];  Service: Cardiovascular;  Laterality: Left;   PERIPHERAL VASCULAR ATHERECTOMY Right 06/07/2021   Procedure: PERIPHERAL VASCULAR ATHERECTOMY;  Surgeon: [name], MD;  Location: [place] [facility];  Service: Cardiovascular;  Laterality: Right;   PERIPHERAL VASCULAR BALLOON ANGIOPLASTY Left 06/26/2019   Procedure: PERIPHERAL VASCULAR BALLOON ANGIOPLASTY;  Surgeon: Wellington Hampshire, MD;  Location: [place] [facility];  Service: Cardiovascular;  Laterality: Left;  unable to cross lt sfa occlusion   PERIPHERAL VASCULAR INTERVENTION  04/13/2017   Procedure: Peripheral Vascular Intervention;  Surgeon: [name], MD;  Location: [facility];  Service: Cardiovascular;;   PRESSURE SENSOR/CARDIOMEMS N/A 02/05/2020   Procedure: PRESSURE SENSOR/CARDIOMEMS;  Surgeon: Larey Dresser, MD;  Location: [place] [facility];  Service: Cardiovascular;  Laterality: N/A;   VEIN HARVEST Left 06/28/2019   Procedure: LEFT LEG GREATER SAPHENOUS VEIN HARVEST;  Surgeon: Rosetta Posner, MD;  Location: [facility];  Service:  Vascular;  Laterality: Left;    Family History  Problem Relation Age of Onset   Hypertension Mother    Diabetes Mother    Cancer Mother        breast, ovarian, colon   Clotting disorder Mother    Heart disease Mother    Heart attack Mother    Breast cancer Mother        in 24's   Hypertension Father    Heart disease Father    Emphysema Sister        smoked    Allergies  Allergen Reactions   Gabapentin Nausea And Vomiting and Other (See Comments)    POSSIBLE SHAKING   Lyrica [Pregabalin] Other (See Comments)    Shaking     Outpatient Medications Prior to Visit  Medication Sig Dispense Refill   ACCU-CHEK GUIDE test strip USE TO CHECK BLOOD SUGAR FOUR TIMES DAILY     acetaminophen (TYLENOL) 500 MG tablet Take 500 mg by mouth every 6 (six) hours as needed for moderate pain.     albuterol (VENTOLIN HFA) 108 (90 Base) MCG/ACT inhaler Inhale 2 puffs into the lungs every 6 (six) hours as needed for wheezing or shortness of breath. 18 g 3   amiodarone (PACERONE) 200 MG tablet Take 0.5 tablets (100 mg total) by mouth daily. 45 tablet 3   amLODipine (NORVASC) 10 MG tablet Take 1 tablet (10 mg total) by mouth daily. 90 tablet 3   apixaban (ELIQUIS) 5 MG TABS tablet Take 5 mg by mouth 2 (two) times daily.     atorvastatin (LIPITOR) 40 MG tablet Take 40 mg by mouth every evening.     budesonide-formoterol (SYMBICORT) 160-4.5 MCG/ACT inhaler Inhale 2 puffs into the lungs 2 (two) times daily. 1 Inhaler 2   clopidogrel (PLAVIX) 75 MG tablet Take 1 tablet (75 mg total) by mouth daily with breakfast. 90 tablet 1   colchicine 0.6 MG tablet Take 1 tablet (0.6 mg total) by mouth daily. 30 tablet 3   Continuous Blood Gluc Sensor (FREESTYLE LIBRE 2 SENSOR) MISC USE 1 (ONE) EACH EVERY 2 WEEKS 2 each 11   doxycycline (VIBRA-TABS) 100 MG tablet Take 1 tablet (100 mg total) by mouth 2 (two) times daily. 12 tablet 0   Dulaglutide (TRULICITY) A999333 0000000 SOPN Inject 0.75 mg into the skin every Friday.  2 mL 6   empagliflozin (JARDIANCE) 10 MG TABS tablet Take 1 tablet (10 mg total) by mouth daily before breakfast. 90 tablet 3  FLUoxetine (PROZAC) 20 MG capsule Take 1 capsule (20 mg total) by mouth at bedtime. 90 capsule 1   folic acid (FOLVITE) 1 MG tablet Take 1 tablet (1 mg total) by mouth daily. 90 tablet 1   hydrALAZINE (APRESOLINE) 25 MG tablet Take 1 tablet (25 mg total) by mouth 3 (three) times daily. 90 tablet 11   insulin glargine (LANTUS) 100 UNIT/ML Solostar Pen Inject 30 Units into the skin daily. 30 mL 6   insulin lispro (HUMALOG) 100 UNIT/ML KwikPen Inject 8 Units into the skin 3 (three) times daily with meals. 15 mL 0   Insulin Pen Needle 32G X 4 MM MISC USE AS DIRECTED WITH INSULIN PENS 100 each 0   levothyroxine (SYNTHROID, LEVOTHROID) 50 MCG tablet Take 1 tablet (50 mcg total) by mouth daily. 30 tablet 0   nortriptyline (PAMELOR) 50 MG capsule Take 50 mg by mouth 2 (two) times daily.     Omega-3 Fatty Acids (FISH OIL PO) Take 1 capsule by mouth daily.     pantoprazole (PROTONIX) 40 MG tablet Take 1 tablet (40 mg total) by mouth daily.     predniSONE (DELTASONE) 20 MG tablet Take 2 tablets (40 mg total) by mouth daily with breakfast. 8 tablet 0   torsemide (DEMADEX) 20 MG tablet Take 60-80 mg by mouth See admin instructions. Take 4 tablets by mouth in the morning, then take 3 tablets by mouth in the afternoon per patient     VITAMIN D PO Take 1 tablet by mouth daily.     No facility-administered medications prior to visit.     ROS Review of Systems  Constitutional:  Negative for activity change, appetite change and fatigue.  HENT:  Negative for congestion, sinus pressure and sore throat.   Eyes:  Negative for visual disturbance.  Respiratory:  Positive for cough and shortness of breath. Negative for chest tightness and wheezing.   Cardiovascular:  Negative for chest pain and palpitations.  Gastrointestinal:  Negative for abdominal distention, abdominal pain and  constipation.  Endocrine: Negative for polydipsia.  Genitourinary:  Negative for dysuria and frequency.  Musculoskeletal:        See HPI  Skin:  Negative for rash.  Neurological:  Negative for tremors, light-headedness and numbness.  Hematological:  Does not bruise/bleed easily.  Psychiatric/Behavioral:  Negative for agitation and behavioral problems.    Objective:  BP (!) 153/67   Pulse 87   Ht '5\' 4"'$  (1.626 m)   Wt 214 lb (97.1 kg)   LMP  (LMP Unknown)   SpO2 91%   BMI 36.73 kg/m   BP/Weight 08/11/2021 08/10/2021 [id number]  Systolic BP 0000000 0000000 [id number]  Diastolic BP 67 78 60  Wt. (Lbs) 214 - 218  BMI 36.73 - 37.42      Physical Exam Constitutional:      Appearance: She is well-developed.  Cardiovascular:     Rate and Rhythm: Normal rate.     Heart sounds: Normal heart sounds. No murmur heard. Pulmonary:     Effort: Pulmonary effort is normal.     Breath sounds: Normal breath sounds. No wheezing or rales.  Chest:     Chest wall: No tenderness.  Abdominal:     General: Bowel sounds are normal. There is no distension.     Palpations: Abdomen is soft. There is no mass.     Tenderness: There is no abdominal tenderness.  Musculoskeletal:        General: Normal range of motion.  Right lower leg: No edema.     Left lower leg: No edema.     Comments: Tophi in interphalangeal joints of hands.  No erythema or tenderness to palpation  Skin:    Comments: Bruising in bilateral hands  Neurological:     Mental Status: She is alert and oriented to person, place, and time.  Psychiatric:        Mood and Affect: Mood normal.    CMP Latest Ref Rng & Units 08/10/2021 07/30/2021 07/29/2021  Glucose 70 - 99 mg/dL 350(H) 266(H) -  BUN 6 - 20 mg/dL 32(H) 21(H) -  Creatinine 0.44 - 1.00 mg/dL 1.57(H) 1.32(H) -  Sodium 135 - 145 mmol/L 136 139 141  Potassium 3.5 - 5.1 mmol/L 3.6 4.5 4.8  Chloride 98 - 111 mmol/L 93(L) 98 -  CO2 22 - 32 mmol/L 31 27 -  Calcium 8.9 - 10.3 mg/dL 8.7(L)  9.2 -  Total Protein 6.5 - 8.1 g/dL 6.5 - -  Total Bilirubin 0.3 - 1.2 mg/dL 0.8 - -  Alkaline Phos 38 - 126 U/L 158(H) - -  AST 15 - 41 U/L 28 - -  ALT 0 - 44 U/L 30 - -    Lipid Panel     Component Value Date/Time   CHOL 129 03/08/2021 1551   TRIG 115 03/08/2021 1551   HDL 47 03/08/2021 1551   CHOLHDL 2.7 03/08/2021 1551   VLDL 23 03/08/2021 1551   LDLCALC 59 03/08/2021 1551    CBC    Component Value Date/Time   WBC 10.1 08/10/2021 1533   RBC 4.40 08/10/2021 1533   HGB 11.6 (L) 08/10/2021 1533   HGB 10.5 (L) 05/25/2021 1444   HCT 38.1 08/10/2021 1533   HCT 35.5 05/25/2021 1444   PLT 388 08/10/2021 1533   PLT 368 05/25/2021 1444   MCV 86.6 08/10/2021 1533   MCV 88 05/25/2021 1444   MCH 26.4 08/10/2021 1533   MCHC 30.4 08/10/2021 1533   RDW 16.1 (H) 08/10/2021 1533   RDW 14.1 05/25/2021 1444   LYMPHSABS 1.9 08/10/2021 1533   LYMPHSABS 1.6 06/20/2019 1125   MONOABS 0.9 08/10/2021 1533   EOSABS 0.2 08/10/2021 1533   EOSABS 0.3 06/20/2019 1125   BASOSABS 0.0 08/10/2021 1533   BASOSABS 0.0 06/20/2019 1125    Lab Results  Component Value Date   HGBA1C 10.5 (A) 08/11/2021    Assessment & Plan:  1. DM (diabetes mellitus), type 2 with peripheral vascular complications ([name]) Uncontrolled with A1c of 10.5; goal is less than 7.0 Increase Trulicity dose Counseled on Diabetic diet, my plate method, [id number] minutes of moderate intensity exercise/week Blood sugar logs with fasting goals of 80-120 mg/dl, random of less than 180 and in the event of sugars less than 60 mg/dl or greater than 400 mg/dl encouraged to notify the clinic. Advised on the need for annual eye exams, annual foot exams, Pneumonia vaccine. - POCT glucose (manual entry) - POCT glycosylated hemoglobin (Hb A1C) - Dulaglutide (TRULICITY) 1.5 0000000 SOPN; Inject 1.5 mg into the skin once a week.  Dispense: 2 mL; Refill: 6  2. Nausea Will place on antiemetic - ondansetron (ZOFRAN) 4 MG tablet; Take 1 tablet  (4 mg total) by mouth every 8 (eight) hours as needed for nausea or vomiting.  Dispense: 20 tablet; Refill: 0  3. Chronic gout of right hand, unspecified cause Will need to exclude acute gout flare Short course of prednisone added If uric acid is elevated will initiate medication for  gout prophylaxis - colchicine 0.6 MG tablet; Take 2 tabs (1.2 mg) at the onset of a gout flare, may take 1 tab (0.6 mg) in 1 hour if symptoms  Dispense: 30 tablet; Refill: 3 - Uric Acid - predniSONE (DELTASONE) 20 MG tablet; Take 2 tablets (40 mg total) by mouth daily with breakfast.  Dispense: 8 tablet; Refill: 0  4. COPD GOLD 0  Ongoing cough and dyspnea PFTs ordered Consider switching to Trelegy if symptoms persist at next visit Prescribed Tessalon Perles - Pulmonary function test; Future - benzonatate (TESSALON) 100 MG capsule; Take 1 capsule (100 mg total) by mouth 2 (two) times daily as needed for cough.  Dispense: 20 capsule; Refill: 0  5. Hypertensive heart disease with chronic combined systolic and diastolic congestive heart failure ([name]) EF 55 -60% Euvolemic Hypertension is uncontrolled hence hydralazine dose has been increased - hydrALAZINE (APRESOLINE) 50 MG tablet; Take 1 tablet (50 mg total) by mouth 3 (three) times daily.  Dispense: 90 tablet; Refill: 6  6. Chronic respiratory failure with hypoxia ([place]) She does qualify for home oxygen after oxygen test I will send of order to [place]. Devices MISC; 2 L oxygen via nasal cannula.  Diagnosis chronic respiratory failure with hypoxia  Dispense: 1 each; Refill: 0  7. Need for immunization against influenza - Flu Vaccine QUAD 50moIM (Fluarix, Fluzone & Alfiuria Quad PF)  Currently being tapered off nortriptyline.  We will decrease down to 25 mg of nortriptyline daily for 2 weeks then 25 mg every other day for 2 weeks.  Once this is completed plan to write a new prescription for 10 mg every other day.  No orders of the defined types  were placed in this encounter.   Return in about 3 months (around 11/10/2021) for Medical conditions.       ECharlott Rakes MD, FAAFP. [facility] W[facility]  08/11/2021, 2:44 PM

## 2021-08-11 NOTE — Progress Notes (Signed)
Gout flare in fingers.

## 2021-08-12 ENCOUNTER — Other Ambulatory Visit: Payer: Self-pay | Admitting: Family Medicine

## 2021-08-12 ENCOUNTER — Other Ambulatory Visit (HOSPITAL_COMMUNITY): Payer: Self-pay

## 2021-08-12 LAB — URIC ACID: Uric Acid: 10.3 mg/dL — ABNORMAL HIGH (ref 3.0–7.2)

## 2021-08-12 MED ORDER — NORTRIPTYLINE HCL 25 MG PO CAPS: 25.0000 mg | ORAL_CAPSULE | Freq: Every day | ORAL | 0 refills | Status: DC

## 2021-08-12 MED ORDER — ALLOPURINOL 100 MG PO TABS: 100.0000 mg | ORAL_TABLET | Freq: Every day | ORAL | 6 refills | Status: DC

## 2021-08-12 NOTE — Progress Notes (Signed)
Pt was seen in PCP office yesterday.  Her nortriptyline is being decreased to '25mg'$  to wean her off, she is going to finish out this last week on the '50mg'$  daily then we will transition to the '25mg'$ .  Came out today for med rec. She is out of the allopurinol.  Ive asked pharmacy to fill this one along with the ones listed below.     Torsemide Amlodipine Pantoprazole Atorvatatin Eliquis Clopidogrel  Levo Folic acid  Amio  Meds verified and pill box refilled.  Will see her in clinic next week.  Will ask them about the losartan-if she is to continue to take or not.   Marylouise Stacks, EMT-Paramedic  08/12/21

## 2021-08-13 ENCOUNTER — Other Ambulatory Visit: Payer: Self-pay

## 2021-08-13 ENCOUNTER — Telehealth: Payer: Self-pay | Admitting: Family Medicine

## 2021-08-13 ENCOUNTER — Encounter (HOSPITAL_BASED_OUTPATIENT_CLINIC_OR_DEPARTMENT_OTHER): Payer: Medicare Other | Admitting: Internal Medicine

## 2021-08-13 DIAGNOSIS — S91301D Unspecified open wound, right foot, subsequent encounter: Secondary | ICD-10-CM | POA: Diagnosis not present

## 2021-08-13 NOTE — Progress Notes (Signed)
SHAYLINN, ZURLO ([id number]) Visit Report for 08/13/2021 Arrival Information Details Patient Name: Date of Service: [name] 08/13/2021 2:15 PM Medical Record Number: [id number] Patient Account Number: [id number] Date of Birth/Sex: Treating RN: 22-Mar-1961 (60 y.o. [name] Primary Care Nanda Bittick: Charlott Rakes Other Clinician: Referring Akanksha Bellmore: Treating Sean Macwilliams/Extender: Johnn Hai, Jairo Ben in Treatment: 25 Visit Information History Since Last Visit Added or deleted any medications: Yes Patient Arrived: Wheel Chair Any new allergies or adverse reactions: No Arrival Time: 14:21 Had a fall or experienced change in No Accompanied By: self activities of daily living that may affect Transfer Assistance: Manual risk of falls: Patient Identification Verified: Yes Signs or symptoms of abuse/neglect since last visito No Secondary Verification Process Completed: Yes Hospitalized since last visit: Yes Patient Requires Transmission-Based Precautions: No Implantable device outside of the clinic excluding No Patient Has Alerts: No cellular tissue based products placed in the center since last visit: Has Dressing in Place as Prescribed: Yes Has Compression in Place as Prescribed: Yes Pain Present Now: No Electronic Signature(s) Signed: 08/13/2021 4:13:46 PM By: Baruch Gouty RN, BSN Entered By: Baruch Gouty on 08/13/2021 14:22:54 -------------------------------------------------------------------------------- Clinic Level of Care Assessment Details Patient Name: Date of Service: [name], [name] 08/13/2021 2:15 PM Medical Record Number: [id number] Patient Account Number: [id number] Date of Birth/Sex: Treating RN: [birth year] (60 y.o. [name] Primary Care Myeshia Fojtik: Charlott Rakes Other Clinician: Referring Jaben Benegas: Treating Jailin Manocchio/Extender: Johnn Hai, Jairo Ben in Treatment: 25 Clinic Level of Care Assessment  Items TOOL 4 Quantity Score '[]'$  - 0 Use when only an EandM is performed on FOLLOW-UP visit ASSESSMENTS - Nursing Assessment / Reassessment X- 1 10 Reassessment of Co-morbidities (includes updates in patient status) X- 1 5 Reassessment of Adherence to Treatment Plan ASSESSMENTS - Wound and Skin A ssessment / Reassessment X - Simple Wound Assessment / Reassessment - one wound 1 5 '[]'$  - 0 Complex Wound Assessment / Reassessment - multiple wounds '[]'$  - 0 Dermatologic / Skin Assessment (not related to wound area) ASSESSMENTS - Focused Assessment X- 1 5 Circumferential Edema Measurements - multi extremities '[]'$  - 0 Nutritional Assessment / Counseling / Intervention X- 1 5 Lower Extremity Assessment (monofilament, tuning fork, pulses) '[]'$  - 0 Peripheral Arterial Disease Assessment (using hand held doppler) ASSESSMENTS - Ostomy and/or Continence Assessment and Care '[]'$  - 0 Incontinence Assessment and Management '[]'$  - 0 Ostomy Care Assessment and Management (repouching, etc.) PROCESS - Coordination of Care X - Simple Patient / Family Education for ongoing care 1 15 '[]'$  - 0 Complex (extensive) Patient / Family Education for ongoing care X- 1 10 Staff obtains Programmer, systems, Records, T Results / Process Orders est '[]'$  - 0 Staff telephones HHA, Nursing Homes / Clarify orders / etc '[]'$  - 0 Routine Transfer to another Facility (non-emergent condition) '[]'$  - 0 Routine Hospital Admission (non-emergent condition) '[]'$  - 0 New Admissions / Biomedical engineer / Ordering NPWT Apligraf, etc. , '[]'$  - 0 Emergency Hospital Admission (emergent condition) X- 1 10 Simple Discharge Coordination '[]'$  - 0 Complex (extensive) Discharge Coordination PROCESS - Special Needs '[]'$  - 0 Pediatric / Minor Patient Management '[]'$  - 0 Isolation Patient Management '[]'$  - 0 Hearing / Language / Visual special needs '[]'$  - 0 Assessment of Community assistance (transportation, D/C planning, etc.) '[]'$  - 0 Additional  assistance / Altered mentation '[]'$  - 0 Support Surface(s) Assessment (bed, cushion, seat, etc.) INTERVENTIONS - Wound Cleansing / Measurement X - Simple Wound Cleansing - one wound 1  5 '[]'$  - 0 Complex Wound Cleansing - multiple wounds X- 1 5 Wound Imaging (photographs - any number of wounds) '[]'$  - 0 Wound Tracing (instead of photographs) X- 1 5 Simple Wound Measurement - one wound '[]'$  - 0 Complex Wound Measurement - multiple wounds INTERVENTIONS - Wound Dressings X - Small Wound Dressing one or multiple wounds 1 10 '[]'$  - 0 Medium Wound Dressing one or multiple wounds '[]'$  - 0 Large Wound Dressing one or multiple wounds X- 1 5 Application of Medications - topical '[]'$  - 0 Application of Medications - injection INTERVENTIONS - Miscellaneous '[]'$  - 0 External ear exam '[]'$  - 0 Specimen Collection (cultures, biopsies, blood, body fluids, etc.) '[]'$  - 0 Specimen(s) / Culture(s) sent or taken to Lab for analysis '[]'$  - 0 Patient Transfer (multiple staff / Civil Service fast streamer / Similar devices) '[]'$  - 0 Simple Staple / Suture removal (25 or less) '[]'$  - 0 Complex Staple / Suture removal (26 or more) '[]'$  - 0 Hypo / Hyperglycemic Management (close monitor of Blood Glucose) '[]'$  - 0 Ankle / Brachial Index (ABI) - do not check if billed separately X- 1 5 Vital Signs Has the patient been seen at the hospital within the last three years: Yes Total Score: 100 Level Of Care: New/Established - Level 3 Electronic Signature(s) Signed: 08/13/2021 4:13:46 PM By: Baruch Gouty RN, BSN Entered By: Baruch Gouty on 08/13/2021 15:00:34 -------------------------------------------------------------------------------- Encounter Discharge Information Details Patient Name: Date of Service: [name], [name] 08/13/2021 2:15 PM Medical Record Number: [id number] Patient Account Number: [id number] Date of Birth/Sex: Treating RN: 01/05/1961 (60 y.o. [name] Primary Care Dorcus Riga: Charlott Rakes Other  Clinician: Referring Veida Spira: Treating Cam Dauphin/Extender: Johnn Hai, Jairo Ben in Treatment: 25 Encounter Discharge Information Items Discharge Condition: Stable Ambulatory Status: Wheelchair Discharge Destination: Home Transportation: Private Auto Accompanied By: self Schedule Follow-up Appointment: Yes Clinical Summary of Care: Patient Declined Electronic Signature(s) Signed: 08/13/2021 4:13:46 PM By: Baruch Gouty RN, BSN Entered By: Baruch Gouty on 08/13/2021 15:34:29 -------------------------------------------------------------------------------- Lower Extremity Assessment Details Patient Name: Date of Service: Durward Parcel S. 08/13/2021 2:15 PM Medical Record Number: [id number] Patient Account Number: [id number] Date of Birth/Sex: Treating RN: [birth year] (60 y.o. [name] Primary Care Karlton Maya: Charlott Rakes Other Clinician: Referring Cataleah Stites: Treating Tiana Sivertson/Extender: Johnn Hai, [name] in Treatment: 25 Edema Assessment Assessed: [Left: No] [Right: No] Edema: [Left: Ye] [Right: s] Calf Left: Right: Point of Measurement: From Medial Instep 36 cm Ankle Left: Right: Point of Measurement: From Medial Instep 21.5 cm Vascular Assessment Pulses: Dorsalis Pedis Palpable: [Right:No] Electronic Signature(s) Signed: 08/13/2021 4:13:46 PM By: Baruch Gouty RN, BSN Entered By: Baruch Gouty on 08/13/2021 14:28:15 -------------------------------------------------------------------------------- Multi Wound Chart Details Patient Name: Date of Service: [name], [name] 08/13/2021 2:15 PM Medical Record Number: [id number] Patient Account Number: [id number] Date of Birth/Sex: Treating RN: [birth year] (60 y.o. [name] Primary Care Muhamad Serano: Charlott Rakes Other Clinician: Referring Brodey Bonn: Treating Carlena Ruybal/Extender: Johnn Hai, [name] in Treatment: 25 Vital Signs Height(in):  64 Capillary Blood Glucose(mg/dl): 225 Weight(lbs): 222 Pulse(bpm): 50 Body Mass Index(BMI): 38 Blood Pressure(mmHg): 112/82 Temperature(F): 98.5 Respiratory Rate(breaths/min): 20 Photos: [N/A:N/A] Right, Plantar Amputation Site - N/A N/A Wound Location: Transmetatarsal Gradually Appeared N/A N/A Wounding Event: Diabetic Wound/Ulcer of the Lower N/A N/A Primary Etiology: Extremity Chronic Obstructive Pulmonary N/A N/A Comorbid History: Disease (COPD), Sleep Apnea, Arrhythmia, Congestive Heart Failure, Coronary Artery Disease, Hypertension, Peripheral Arterial Disease, Cirrhosis , Type II Diabetes, Osteoarthritis, Neuropathy 02/05/2021 N/A N/A  Date Acquired: 23 N/A N/A Weeks of Treatment: Open N/A N/A Wound Status: 0.7x1.5x0.7 N/A N/A Measurements L x W x D (cm) 0.825 N/A N/A A (cm) : rea 0.577 N/A N/A Volume (cm) : 49.50% N/A N/A % Reduction in A rea: -17.80% N/A N/A % Reduction in Volume: 6 Starting Position 1 (o'clock): 3 Ending Position 1 (o'clock): 1.1 Maximum Distance 1 (cm): Yes N/A N/A Undermining: Grade 2 N/A N/A Classification: Medium N/A N/A Exudate A mount: Serosanguineous N/A N/A Exudate Type: red, brown N/A N/A Exudate Color: Thickened N/A N/A Wound Margin: Medium (34-66%) N/A N/A Granulation A mount: Red N/A N/A Granulation Quality: Medium (34-66%) N/A N/A Necrotic A mount: Fat Layer (Subcutaneous Tissue): Yes N/A N/A Exposed Structures: Fascia: No Tendon: No Muscle: No Joint: No Bone: No None N/A N/A Epithelialization: Treatment Notes Electronic Signature(s) Signed: 08/13/2021 3:46:44 PM By: Linton Ham MD Signed: 08/13/2021 4:13:46 PM By: Baruch Gouty RN, BSN Entered By: Linton Ham on 08/13/2021 15:22:46 -------------------------------------------------------------------------------- Multi-Disciplinary Care Plan Details Patient Name: Date of Service: [name], [name] 08/13/2021 2:15 PM Medical Record  Number: [id number] Patient Account Number: [id number] Date of Birth/Sex: Treating RN: [birth year] (60 y.o. [name] Primary Care Kynzie Polgar: Charlott Rakes Other Clinician: Referring Wladyslawa Disbro: Treating Chandler Swiderski/Extender: Johnn Hai, Jairo Ben in Treatment: 25 [place] reviewed with physician Active Inactive Wound/Skin Impairment Nursing Diagnoses: Impaired tissue integrity Goals: Patient/caregiver will verbalize understanding of skin care regimen Date Initiated: 02/19/2021 Target Resolution Date: 09/09/2021 Goal Status: Active Ulcer/skin breakdown will have a volume reduction of 30% by week 4 Date Initiated: 02/19/2021 Date Inactivated: 03/29/2021 Target Resolution Date: 04/09/2021 Goal Status: Unmet Unmet Reason: PAD Interventions: Assess patient/caregiver ability to obtain necessary supplies Assess patient/caregiver ability to perform ulcer/skin care regimen upon admission and as needed Assess ulceration(s) every visit Provide education on ulcer and skin care Treatment Activities: Skin care regimen initiated : 02/19/2021 Topical wound management initiated : 02/19/2021 Notes: Electronic Signature(s) Signed: 08/13/2021 4:13:46 PM By: Baruch Gouty RN, BSN Entered By: Baruch Gouty on 08/13/2021 14:56:51 -------------------------------------------------------------------------------- Pain Assessment Details Patient Name: Date of Service: Durward Parcel S. 08/13/2021 2:15 PM Medical Record Number: [id number] Patient Account Number: [id number] Date of Birth/Sex: Treating RN: 1961-07-15 (60 y.o. [name] Primary Care Jennika Ringgold: Charlott Rakes Other Clinician: Referring TRUE Garciamartinez: Treating Deyna Carbon/Extender: Johnn Hai, Romelle Starcher Weeks in Treatment: 25 Active Problems Location of Pain Severity and Description of Pain Patient Has Paino No Site Locations Rate the pain. Current Pain Level: 0 Pain Management and  Medication Current Pain Management: Electronic Signature(s) Signed: 08/13/2021 4:13:46 PM By: Baruch Gouty RN, BSN Entered By: Baruch Gouty on 08/13/2021 14:26:37 -------------------------------------------------------------------------------- Patient/Caregiver Education Details Patient Name: Date of Service: [name] 9/30/2022andnbsp2:15 PM Medical Record Number: [id number] Patient Account Number: [id number] Date of Birth/Gender: Treating RN: Mar 29, 1961 (60 y.o. [name] Primary Care Physician: Charlott Rakes Other Clinician: Referring Physician: Treating Physician/Extender: Johnn Hai, Jairo Ben in Treatment: 25 Education Assessment Education Provided To: Patient Education Topics Provided Offloading: Methods: Explain/Verbal Responses: Reinforcements needed, State content correctly Smoking and Wound Healing: Methods: Explain/Verbal Responses: Reinforcements needed, State content correctly Wound/Skin Impairment: Methods: Explain/Verbal Responses: Reinforcements needed, State content correctly Electronic Signature(s) Signed: 08/13/2021 4:13:46 PM By: Baruch Gouty RN, BSN Entered By: Baruch Gouty on 08/13/2021 14:58:03 -------------------------------------------------------------------------------- Wound Assessment Details Patient Name: Date of Service: Durward Parcel S. 08/13/2021 2:15 PM Medical Record Number: [id number] Patient Account Number: [id number] Date of Birth/Sex: Treating RN: 09-Mar-1961 (60 y.o.  [name] Primary Care Kecia Swoboda: Charlott Rakes Other Clinician: Referring Demarie Hyneman: Treating Tekelia Kareem/Extender: Johnn Hai, [name] in Treatment: 25 Wound Status Wound Number: 16 Primary Diabetic Wound/Ulcer of the Lower Extremity Etiology: Wound Location: Right, Plantar Amputation Site - Transmetatarsal Wound Open Wounding Event: Gradually Appeared Status: Date Acquired:  02/05/2021 Comorbid Chronic Obstructive Pulmonary Disease (COPD), Sleep Apnea, Weeks Of Treatment: 25 History: Arrhythmia, Congestive Heart Failure, Coronary Artery Disease, Clustered Wound: No Hypertension, Peripheral Arterial Disease, Cirrhosis , Type II Diabetes, Osteoarthritis, Neuropathy Photos Wound Measurements Length: (cm) 0.7 Width: (cm) 1.5 Depth: (cm) 0.7 Area: (cm) 0.825 Volume: (cm) 0.577 % Reduction in Area: 49.5% % Reduction in Volume: -17.8% Epithelialization: None Tunneling: No Undermining: Yes Starting Position (o'clock): 6 Ending Position (o'clock): 3 Maximum Distance: (cm) 1.1 Wound Description Classification: Grade 2 Wound Margin: Thickened Exudate Amount: Medium Exudate Type: Serosanguineous Exudate Color: red, brown Foul Odor After Cleansing: No Slough/Fibrino Yes Wound Bed Granulation Amount: Medium (34-66%) Exposed Structure Granulation Quality: Red Fascia Exposed: No Necrotic Amount: Medium (34-66%) Fat Layer (Subcutaneous Tissue) Exposed: Yes Necrotic Quality: Adherent Slough Tendon Exposed: No Muscle Exposed: No Joint Exposed: No Bone Exposed: No Treatment Notes 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 Wound Cleanser Discharge Instruction: Cleanse the wound with wound cleanser or normal saline prior to applying a clean dressing using gauze sponges, not tissue or cotton balls. Peri-Wound Care Sween Lotion (Moisturizing lotion) Discharge Instruction: Apply moisturizing lotion as directed Topical Primary Dressing Promogran Prisma Matrix, 4.34 (sq in) (silver collagen) Discharge Instruction: Moisten collagen with saline or hydrogel Secondary Dressing Woven Gauze Sponge, Non-Sterile 4x4 in Discharge Instruction: Apply over primary dressing as directed. Optifoam  Non-Adhesive Dressing, 4x4 in Discharge Instruction: Foam donut to help offload Secured With Compression Wrap Kerlix Roll 4.5x3.1 (in/yd) Discharge Instruction: Apply Kerlix and Coban compression as directed. Coban Self-Adherent Wrap 4x5 (in/yd) Discharge Instruction: Apply over Kerlix as directed. Compression Stockings Add-Ons Electronic Signature(s) Signed: 08/13/2021 4:13:46 PM By: Baruch Gouty RN, BSN Signed: 08/13/2021 4:27:12 PM By: Levan Hurst RN, BSN Entered By: Baruch Gouty on 08/13/2021 14:31:27 -------------------------------------------------------------------------------- [place] Details Patient Name: Date of Service: [name], [name] 08/13/2021 2:15 PM Medical Record Number: [id number] Patient Account Number: [id number] Date of Birth/Sex: Treating RN: 1961-01-10 (60 y.o. [name] Primary Care Tekia Waterbury: Charlott Rakes Other Clinician: Referring Brandt Chaney: Treating Juvencio Verdi/Extender: Johnn Hai, [name] in Treatment: 25 Vital Signs Time Taken: 14:22 Temperature (F): 98.5 Height (in): 64 Pulse (bpm): 94 Source: Stated Respiratory Rate (breaths/min): 20 Weight (lbs): 222 Blood Pressure (mmHg): 112/82 Source: Stated Capillary Blood Glucose (mg/dl): 225 Body Mass Index (BMI): 38.1 Reference Range: 80 - 120 mg / dl Notes glucose per pt report this am Electronic Signature(s) Signed: 08/13/2021 4:13:46 PM By: Baruch Gouty RN, BSN Signed: 08/13/2021 4:13:46 PM By: Baruch Gouty RN, BSN Entered By: Baruch Gouty on 08/13/2021 14:23:51

## 2021-08-13 NOTE — Telephone Encounter (Signed)
-----   Message from Charlott Rakes, MD sent at 08/12/2021 10:12 AM EDT ----- Please inform her that her blood work is in keeping with an acute gout flare.  I have sent a prescription for allopurinol which she will start after her flare resolves and this will be for gout prophylaxis.

## 2021-08-13 NOTE — Progress Notes (Signed)
Patient ID: [name], female   DOB: [birth year], 60 y.o.   MRN: [id number]    Advanced Heart Failure Clinic Note   PCP: [facility] PV: Dr. Gwenlyn Found HF Cardiology: Dr. Aundra Dubin  [name] is a 60 y.o. with history of PAD, carotid stenosis s/p left CEA, relatively mild CAD, chronic systolic CHF, paroxysmal atrial fibrillation and prior substance abuse presents for CHF clinic followup.  She was admitted in 1/17 with acute hypoxemic respiratory failure in the setting of atrial fibrillation/RVR and volume overload.  She was initially intubated.  She converted back to NSR with amiodarone gtt.  She was treated with IV Lasix, steroids, bronchodilators.  She developed AKI and losartan was stopped.    She was admitted in 3/17 with symptomatic bradycardia.  She was supposed to stop diltiazem after this admission but continued to take it.  She presented later in 3/17, again with symptomatic bradycardia (junctional bradycardia), hypotension, and AKI.  Diltiazem and bisoprolol were stopped.  HR recovered and she has had no problems since.  ACEI was stopped with AKI.   In 5/18, she had peripheral angiography with atherectomy of right SFA.  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 7/18 ABIs showed 0.65 on right, 0.31 on left.  In 8/20, she had peripheral angiography showing totally occluded left SFA.  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 Donnetta Hutching.  ABIs in 2/21 were 0.44 on right, 0.99 on left.   Echo in 1/21 showed EF 50%, mild LVH, normal RV, PASP 60 mmHg.   Echo 03/08/21 EF 55-60% with basal to mid inferolateral hypokinesis.   Admitted 6/22 for mechanical fall, found to have AKI on CKD. She was given IV lasix and her losartan was held.  Underwent PV angiogram with Dr. Gwenlyn Found 7/22. She had drug-coated balloon angioplasty of the right SFA after being admitted for critical ischemia of the right foot.  Admitted 8/22 for  bradycardia and AKI, bisoprolol subsequently stopped; amiodarone was continued.  Seen in ED 9/22 for N/V w/ black tarry stools after taking Pepto-Bismal, losartan stopped for unclear reasons.  Today she returns for post ED visit HF follow up, with paramedicine. No further nausea or vomiting. She is able to get around her house OK without significant exertional dyspnea. She is limited mostly by her foot pain. Denies CP, dizziness, edema, or PND/Orthopnea. Appetite ok. No fever or chills. Weight at home 214 pounds. Taking all medications. Smoking 1/2 PPD.  ECG (personally reviewed): SR 93 bpm, QTc 477.  Cardiomems: Last reading 08/16/21 showed PADP 17 (goal 20).  Labs (1/17): K 5.2, creatinine 1.4, AST 43, ALT normal Labs (2/17): K 4.2, creatinine 1.3, BNP 607, TSH normal Labs (3/17): K 4.2, creatinine 1.45, AST/ALT normal, HCT 28.7 Labs (4/17): K 4.4, creatinine 1.61 Labs (08/31/2016): K 4.5 Creatinine 1.43  Labs (11/17): K 4.2, creatinine 1.4 Labs (9/18): K 4.1, creatinine 1.1, TSH normal Labs 04/13/2018: K 5 Creatinine 1.75 Labs (2/21): K 4.1, creatinine 1.58 Labs (4/22): K 3.7, creatinine 1.55 Labs (5/22): K 3.8, creatinine 1.54 Labs (8/22): K 4.0, creatinine 2.07  PMH: 1. Carotid stenosis: Known occluded right carotid.  Left CEA in 4/16.  - Carotid dopplers (7/21): CTO RICA, mild disease LICA.  2. CAD: LHC in 12/15 with 80% stenosis in small OM1, nonobstructive disease in other territories.  3. Chronic systolic CHF: Nonischemic cardiomyopathy (?due to ETOH or prior drug abuse).  She has a  Cardiomems.  - Echo (1/17) with EF 45%, mild LV hypertrophy, moderate diastolic dysfunction, inferolateral severe hypokinesis, mildly decreased RV systolic function.  - Echo (1/18): EF 40-45%, mild LVH, normal RV size and systolic function.  - Echo (1/21): EF 50%, mild LVH, normal RV, PASP 60 mmHg.  - Echo (4/22): EF 55-60%, basal-mid inferolateral hypokinesis with grade II diastolic dysfunction, RV  normal, PASP 42 4. Atrial fibrillation: Paroxysmal.   5. Type II diabetes 6. CKD: Stage III. 7. COPD: Smokes 1 ppd.  8. 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  No longer drinks.  9. Hypothyroidism 10. PAD: Atherectomy SFA in 2014 (Dr Gwenlyn Found).  Peripheral arterial dopplers (2/17) with focal 75-99% proximal right SFA stenosis, occluded mid-distal right SFA, chronic occlusion of all runoff arteries on right. - In 5/18, she had peripheral angiography with atherectomy of right SFA.   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  - 7/18 ABIs showed 0.65 on right, 0.31 on left.   - Peripheral angiography 8/20: Totally occluded left SFA.  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 Donnetta Hutching.   - ABIs (2/21): 0.44 R, 0.99 L.  11. Anemia 12. Prior cocaine abuse.  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  14. OSA: Has not been able to tolerate CPAP.   SH: Lives with sister.  Prior cocaine abuse.  Prior ETOH abuse.  Quit smoking in 1/17, restarted, and quit again in 4/19, restarted again.  FH: CAD  Review of systems complete and found to be negative unless listed in HPI.    Current Outpatient Medications  Medication Sig Dispense Refill   ACCU-CHEK GUIDE test strip USE TO CHECK BLOOD SUGAR FOUR TIMES DAILY     acetaminophen (TYLENOL) 500 MG tablet Take 500 mg by mouth every 6 (six) hours as needed for moderate pain.     albuterol (VENTOLIN HFA) 108 (90 Base) MCG/ACT inhaler Inhale 2 puffs into the lungs every 6 (six) hours as needed for wheezing or shortness of breath. 18 g 3   allopurinol (ZYLOPRIM) 100 MG tablet Take 1 tablet (100 mg total) by mouth daily. 30 tablet 6   amiodarone (PACERONE) 200 MG tablet Take 0.5 tablets (100 mg total) by mouth daily. 45 tablet 3   amLODipine (NORVASC) 10 MG tablet Take 1 tablet (10 mg total) by mouth daily. 90 tablet 3   apixaban (ELIQUIS) 5 MG TABS tablet Take 5  mg by mouth 2 (two) times daily.     atorvastatin (LIPITOR) 40 MG tablet Take 40 mg by mouth every evening.     benzonatate (TESSALON) 100 MG capsule Take 1 capsule (100 mg total) by mouth 2 (two) times daily as needed for cough. 20 capsule 0   budesonide-formoterol (SYMBICORT) 160-4.5 MCG/ACT inhaler Inhale 2 puffs into the lungs 2 (two) times daily. 1 Inhaler 2   clopidogrel (PLAVIX) 75 MG tablet Take 1 tablet (75 mg total) by mouth daily with breakfast. 90 tablet 1   colchicine 0.6 MG tablet Take 2 tabs (1.2 mg) at the onset of a gout flare, may take 1 tab (0.6 mg) in 1 hour if symptoms 30 tablet 3   Continuous Blood Gluc Sensor (FREESTYLE LIBRE 2 SENSOR) MISC USE 1 (ONE) EACH EVERY 2 WEEKS 2 each 11   Dulaglutide (TRULICITY) 1.5 0000000 SOPN Inject 1.5 mg into the skin once a week. 2 mL 6   empagliflozin (JARDIANCE) 10 MG TABS  tablet Take 1 tablet (10 mg total) by mouth daily before breakfast. 90 tablet 3   FLUoxetine (PROZAC) 20 MG capsule Take 1 capsule (20 mg total) by mouth at bedtime. 90 capsule 1   folic acid (FOLVITE) 1 MG tablet Take 1 tablet (1 mg total) by mouth daily. 90 tablet 1   hydrALAZINE (APRESOLINE) 50 MG tablet Take 1 tablet (50 mg total) by mouth 3 (three) times daily. 90 tablet 6   insulin glargine (LANTUS) 100 UNIT/ML Solostar Pen Inject 30 Units into the skin daily. 30 mL 6   insulin lispro (HUMALOG) 100 UNIT/ML KwikPen Inject 8 Units into the skin 3 (three) times daily with meals. 15 mL 0   Insulin Pen Needle 32G X 4 MM MISC USE AS DIRECTED WITH INSULIN PENS 100 each 0   levothyroxine (SYNTHROID, LEVOTHROID) 50 MCG tablet Take 1 tablet (50 mcg total) by mouth daily. 30 tablet 0   nortriptyline (PAMELOR) 25 MG capsule Take 1 capsule (25 mg total) by mouth at bedtime. For 2 weeks then taper down to every other day for 2-weeks 30 capsule 0   Omega-3 Fatty Acids (FISH OIL PO) Take 1 capsule by mouth daily.     ondansetron (ZOFRAN) 4 MG tablet Take 1 tablet (4 mg total) by  mouth every 8 (eight) hours as needed for nausea or vomiting. 20 tablet 0   pantoprazole (PROTONIX) 40 MG tablet Take 1 tablet (40 mg total) by mouth daily.     predniSONE (DELTASONE) 20 MG tablet Take 2 tablets (40 mg total) by mouth daily with breakfast. 8 tablet 0   torsemide (DEMADEX) 20 MG tablet Take 60-80 mg by mouth See admin instructions. Take 4 tablets by mouth in the morning, then take 3 tablets by mouth in the afternoon per patient     VITAMIN D PO Take 1 tablet by mouth daily.     Misc. Devices MISC 2 L oxygen via nasal cannula.  Diagnosis chronic respiratory failure with hypoxia (Patient not taking: Reported on 08/16/2021) 1 each 0   No current facility-administered medications for this encounter.   BP (!) 158/78   Pulse 97   Wt 97.3 kg   LMP  (LMP Unknown)   SpO2 96%   BMI 36.84 kg/m   Wt Readings from Last 3 Encounters:  08/16/21 97.3 kg  08/11/21 97.1 kg  08/05/21 98.9 kg    PHYSICAL EXAM: General:  NAD. No resp difficulty, chronically-ill appearing HEENT: Normal Neck: Supple. No JVD. Carotids 2+ bilat; no bruits. No lymphadenopathy or thryomegaly appreciated. Cor: PMI nondisplaced. Regular rate & rhythm. No rubs, gallops or murmurs. Lungs: RUL wheeze Abdomen: Obese, nontender, nondistended. No hepatosplenomegaly. No bruits or masses. Good bowel sounds. Extremities: No cyanosis, clubbing, rash, trace RLE edema with ortho shoe, s/p R TMA. Neuro: Alert & oriented x 3, cranial nerves grossly intact. Moves all 4 extremities w/o difficulty. Affect pleasant.  Assessment/Plan: 1. Chronic diastolic CHF: LHC in [id number] showing only 80% stenosis in small OM1. EF as low as 40-45% in the past. Echo 4/22 showed EF 55-60% with grade 2 diastolic dysfunction.   She is euvolemic on exam and by Cardiomems.  Not very active, NYHA class II-early III symptoms. She is limited mostly by her foot pain. - Will restart losartan 50 mg daily (previously on 100 mg daily, unclear why this was  stopped). Recent labs at PCP reviewed and OK, BMET in 10 days. - Continue torsemide 80 mg q am/60 mg q pm. - Continue Jardiance  10 mg daily. No GU symptoms. - Off bisoprolol due to bradycardia. Consider re-challenge next. - Advised continued low sodium diet + fluid restriction.  - Appreciate paramedicine assistance. 2. Atrial fibrillation: Paroxysmal. Episode of Afib w/ RVR during 07/29/21 ED visit, she spontaneously converted. She is SR today on ECG. - Continue amiodarone 100 mg daily. Recent HFTs/ TFTs 4/22 ok.  She is on levothyroxine, followed by PCP.   - Needs regular eye exams. - Continue Eliquis 5 mg bid.  No bleeding. 3. PAD: Claudication bilaterally.  Not candidate for cilostazol with CHF.  She has had a left fem-pop bypass in 8/20.  ABIs in 2/21 were very abnormal on right.  She also has an ulceration on her right foot. She underwent PV angiogram with balloon angioplasty of the right SFA 7/22. - Followup with Drs Gwenlyn Found and Early for PAD.  - Continue atorvastatin. LDL at goal on Lipid panel 4/22 at 64 mg/dL.  - Continue with [facility].  4. COPD:  Continues to smoke, encouraged to quit.   5. H/o cirrhosis: Likely from ETOH, she has quit.  6. OSA: Not able to tolerate CPAP.   7. CKD stage III:  Recent labs OK, SCr 1.57, K 3.6 (08/10/21). 8. HTN: Elevated today. Restarting losartan as above.  9.Type 2 diabetes: Per PCP.   10. CAD: LHC in 12/15 with 80% stenosis in small OM1, nonobstructive disease in other territories. No chest pain. - Continue statin.  - Continue Eliquis.  Follow up with Dr. Aundra Dubin as scheduled.  [place], FNP-BC 08/16/2021 12:28 PM

## 2021-08-13 NOTE — Telephone Encounter (Signed)
Both number on file are non-working for the patient.

## 2021-08-13 NOTE — Progress Notes (Signed)
SHAHARA, HARTSFIELD ([id number]) Visit Report for 08/13/2021 HPI Details Patient Name: Date of Service: [name] 08/13/2021 2:15 PM Medical Record Number: [id number] Patient Account Number: [id number] Date of Birth/Sex: Treating RN: 05/23/1961 (60 y.o. [name] Primary Care Provider: Charlott Rakes Other Clinician: Referring Provider: Treating Provider/Extender: Johnn Hai, Romelle Starcher Weeks in Treatment: 25 History of Present Illness HPI Description: This 60 year old patient who has a very long significant history of diabetes mellitus, previous alcohol and nicotine abuse, chronic data disease, COPD, diabetes mellitus, height hypertension, critical lower limb ischemia with several wounds being managed at the wound Center at [facility] for over a year. She was recently in the ER at [facility] and was referred to our center. He has had a long history of critical limb ischemia and over a period of time has had balloon angioplasties in March 2016, endarterectomy of the left carotid, lower extremity angiogram and treatment by Dr. Quay Burow, several cardiac catheterizations. Most recently she had an x-ray of her left foot while in the ER which showed no acute abnormality. during this ER visit she was started on ciprofloxacin and asked to continue with the wound care physicians at [facility]. Her last ABI done in June 2017 showed the right side to be 0.28 in the left side to be 0.48. Her right toe brachial index was 0.17 on the right and 0.27 on the left. her last hemoglobin A1c was 11.1% She continues to smoke about a pack of cigarettes a day. 03/28/2017 -- -- right foot x-ray -- IMPRESSION:Areas of soft tissue swelling. Mild subluxation second PIP joint. No frank dislocation or fracture. No erosive change or bony destruction. No soft tissue ulceration or radiopaque foreign body evident by radiography. There is plantar fascia calcification with a nearby inferior  calcaneal spur. There are foci of arterial vascular calcification. 04/04/2017 -- the patient continues to be noncompliant and continues to complain of a lot of pain and has not done anything about quitting smoking Readmission: 06/26/18 on evaluation today patient presents for reevaluation she has not been seen in our office since May 2018. Since she was last seen here in our office she has undergone testing at Dr. Elspeth Cho office where it was revealed that she had an ABI of 0.68 with her previous ABI being 0.64 and a left ABI of 0.38 with previous ABI being 0.34 this study which was performed on 04/05/18 was pretty much equivalent to the study performed on 11/22/17. The findings in the end revealed on the right would appear to be moderate right lower extremity arterial disease on the left Dr. Gwenlyn Found states moderate in the report but unfortunately this appears to be much more severe at 0.38 compared to the right. Subsequently the patient was scheduled to have angiography with Dr. Gwenlyn Found on 04/12/18. This was however canceled due to the fact that the patient was found to have chronic kidney disease stage III and it was to the point that he did not feel that it will be safe to pursue angiography at that point. She has not been on any antibiotics recently. At this point Dr. Gwenlyn Found has not rescheduled anything as far as the injured Colbert according to the patient he is somewhat reluctant to do so. Nonetheless with her diminished blood flow this is gonna make it somewhat difficult for her to heal. 07/05/18; this is a patient I have not seen previously. She has very significant PAD as noted above. She apparently has had  revascularization efforts by Dr. Gwenlyn Found on the right on 3 occasions to the patient. She was supposed to have an attempted angiography on the left however this was canceled apparently because of stage III chronic renal failure. I will need to research all of this. She complains of significant pain  in the wound and has claudication enough that when she walks to the end of the driveway she has to stop. She is been using silver alginate to the wounds on her legs and Iodoflex to the area on her left second toe. 07/13/18 on evaluation today patient's wounds actually appear to be doing about the same. She has an appointment she tells me within the next month that is September 2019 with Dr. Gwenlyn Found to discuss options to see if there's anything else that you can recommend or do for her. Nonetheless obviously what we're trying to do is do what we can to save her leg and in turn prevent any additional worsening or damage. None in the meantime we been mainly trying to manage her ulcers as best we can. 07/27/18; some improvement in the multiplicity of wounds on her left lower extremity and foot. She's been using silver alginate. I was unable to determine that she actually has an appointment with Dr. Gwenlyn Found. We are checking into this The patient's wound includes Left lateral foot, left plantar heel, her left anterior calf wound looks close to me, left fourth toe is very close to closed and the left medial calf is perhaps the largest open area READMISSION 02/19/2021 This is a now 59 year old woman with type 2 diabetes and continued cigarette smoking. She has known PAD. She was last in this clinic in 2019 at that point with wounds on her left foot. She left in a nonhealed state. On 06/28/2019 I see she had a left femoropopliteal by vein and vascular with an amputation of the fifth ray. These managed to heal over. Her last arterial studies were in February 2021. This showed an absent waveform at the posterior tibial artery on the right a dampened monophasic dorsalis pedis on the right of 0.44. On the left again the PTA TA was absent her dorsalis pedis was 0.99 triphasic and her great toe pressure was 0.65. This would have been after her revascularization. Once again she tells me that Dr. Gwenlyn Found has done  revascularization on the right leg on 3 different occasions. She has a right transmetatarsal amputation apparently done by Dr. Sharol Given remotely but I do not see a note for these right lower extremity revascularization but I may not of looked back far enough. In any case she says that the she has a wound on the plantar aspect of the right transmetatarsal amputation site there is been there for several months. More recently she fell and had a wound on her right medial lower leg she had 5 sutures placed in the ER and then subsequently she has developed an area on the medial lower leg which was a blister that opened up. Past medical history includes type 2 diabetes, PAD, chronic renal failure, congestive heart failure, right transmetatarsal amputation, left fifth ray amputation, continued tobacco abuse, atrial fibrillation and cirrhosis. We did not attempt an ABI on the right leg today because of pain 02/26/2021; patient we admitted to the clinic last week. She has what looks to be 2 areas on her right medial and right anterior lower leg that look more like venous wounds but she has 1 on the first met head at the base of  her right transmet. She has known severe PAD. She complains of a lot of pain although some of this may be neuropathic. Is difficult to exclude a component of claudication. Use silver alginate on the wounds on her legs and Iodoflex on the area on the first met head. She has an appointment with Dr. Gwenlyn Found on 5/25 although I will text him and discussed the situation. She is have poorly controlled diabetic. She has has stage IIIb chronic renal failure 4/22; patient presents for 1 week follow-up. The 2 areas on her right medial and right anterior lower leg appear well-healing. She has been using silver alginate to this area without issues. She has a first met head ulcer and it is unclear how long this has been there as it was discovered in the ED earlier this month when she was being  evaluated for another issue. Iodoflex has been used at this area. 4/29; patient presents for 1 week follow-up. She has been using silver alginate to the leg wounds and Iodoflex to the plantar foot wound. She has had this wrapped with Coban and Kerlix. She has no concerns or complaints today. 5/6; this is a difficult wound on the right plantar foot transmit site. We are not making a lot of progress. She had 2 more venous looking wounds on the right medial and right anterior lower leg 1 of which is healed. Her appointment with Dr. Gwenlyn Found is on 5/25 5/16; she did not tolerate the offloading shoe we gave her in fact she had a fall with an abrasion on her right forearm she is now back in the modified small shoe which does not offload her foot properly. Her appoint with Dr. Gwenlyn Found is still on 5/25 we have been using Iodoflex. The area on the right anterior lower leg is healed 7/18; patient presents for follow-up however has not been seen in 2 months. She was last seen at the end of May. She had a fall at the end of June and was hospitalized. She has been unable to follow-up since then. For the wound she has only been keeping it covered with gauze. She is scheduled for an aortogram with lower extremity runoff at the end of July. 7/29; patient presents for 2-week follow-up. She has home health and they have been changing her dressings. She denies any issues and has no complaints today. She states she had a procedure where they opened up one of her vessels in her legs. She denies signs of infection. 8/5; patient presents for follow-up. She was recently in the hospital for bradycardia. She was noted to have cellulitis to the left lower extremity and Unna boot was placed due to blisters and increased swelling. She reports improvement to her left leg since being in the hospital and stability check her right plantar foot wound. She denies signs of infection. 8/23; since I last saw this patient a lot has happened.  She still has the area over the right first met head in the setting of previous transmetatarsal amputation. Firstly most importantly she underwent revascularization of her occluded right SFA. She underwent directional atherectomy followed by drug-coated balloon angioplasty. She has no named vessel below the knee hopefully the revascularization of the right SFA will improve collateral flow. It is not felt however that she has any endovascular options. She had follow-up arterial studies noninvasive on 8/9. These showed an ABI of 0.44 at the right PTA. Monophasic waveforms. On the left her great toe ABI was 0.68. Her follow-up arterial Doppler  showed a 50 to 74% stenosis in the proximal SFA and a 50 to 74% stenosis in the distal SFA mild to severe atherosclerosis noted throughout the extremity. Areas of shadowing plaque seen, unable to rule out higher grade stenosis she also had a fall apparently wearing a right foot forefoot off loader that we gave her. She therefore comes in in an ordinary running shoe today. Not certain if this is the fall that ended up in the hospital with bradycardia 9/6 area over the right first metatarsal head in the setting of her previous amputation and severe PAD. She is also a diabetic with known PAD status post attempt at revascularization. She is continued smoker Again the separation of visits in the clinic is somewhat disturbing if we are going to consider her for a total contact cast. She is not wearing anything to offload this stating it causes imbalance especially the forefoot off loader. She basically comes in in bedroom slippers She also had a fall this morning about an hour ago. She has a skin tear on the left dorsal forearm 9/13; areas over the right first metatarsal head in the setting of previous TMA and severe PAD. Again she comes in here in slippers. We have been using Hydrofera Blue. We use MolecuLight on this which was essentially negative study 9/20; right  first metatarsal head in the setting of her previous TMA and severe PAD. Same slipper type shoes. She says she cannot wear a forefoot off loader and for some reason she will not wear surgical shoes. She says she is smoking half a pack per day. 9/30; right first metatarsal head. Absolutely no improvement in fact there is tunneling superiorly very close to bone. She does not offload this properly at all. We use MolecuLight on this 2 weeks ago that did not show any surface bacteria we have been using silver collagen is a dressing She tells me she is down to 3 cigarettes a day I have offered encouragement and a treatment plan Electronic Signature(s) Signed: 08/13/2021 3:46:44 PM By: Linton Ham MD Entered By: Linton Ham on 08/13/2021 15:24:10 -------------------------------------------------------------------------------- Physical Exam Details Patient Name: Date of Service: [name], [name] 08/13/2021 2:15 PM Medical Record Number: [id number] Patient Account Number: [id number] Date of Birth/Sex: Treating RN: [birth year] (61 y.o. [name] Primary Care Provider: Charlott Rakes Other Clinician: Referring Provider: Treating Provider/Extender: Johnn Hai, [name] in Treatment: 25 Constitutional Sitting or standing Blood Pressure is within target range for patient.. Pulse regular and within target range for patient.[name] Respirations regular, non-labored and within target range.. Temperature is normal and within the target range for the patient.[name] Appears in no distress. Cardiovascular Pedal pulses are nonpalpable however foot is not cold. Notes Wound exam; right first metatarsal head. Previous TMA. Not so much skin and callus around this however she has a friable surface of this with a tunneling area discovered by her intake nurse superiorly. I think this is in the same area that we had undermining previously. This does not seem to go to bone however it is certainly  not far off it. No evidence of surrounding infection Electronic Signature(s) Signed: 08/13/2021 3:46:44 PM By: Linton Ham MD Entered By: Linton Ham on 08/13/2021 15:27:28 -------------------------------------------------------------------------------- Physician Orders Details Patient Name: Date of Service: [name], [name] 08/13/2021 2:15 PM Medical Record Number: [id number] Patient Account Number: [id number] Date of Birth/Sex: Treating RN: April 21, 1961 (60 y.o. [name] Primary Care Provider: Charlott Rakes Other Clinician: Referring Provider: Treating  Provider/Extender: Johnn Hai, [name] in Treatment: 25 Verbal / Phone Orders: No Diagnosis Coding Follow-up Appointments ppointment in 1 week. - with Dr. Dellia Nims Return A Bathing/ Shower/ Hygiene May shower with protection but do not get wound dressing(s) wet. Edema Control - Lymphedema / SCD / Other Elevate legs to the level of the heart or above for 30 minutes daily and/or when sitting, a frequency of: - throughout the day Avoid standing for long periods of time. Patient to wear own compression stockings every day. - left leg Exercise regularly Off-Loading Open toe surgical shoe to: - with felt padding to offload Additional Orders / Instructions Stop/Decrease Smoking Follow Nutritious Diet Wound Treatment Wound #16 - Amputation Site - Transmetatarsal Wound Laterality: Plantar, Right Cleanser: Soap and Water 1 x Per Week/7 Days Discharge Instructions: May shower and wash wound with dial antibacterial soap and water prior to dressing change. Cleanser: Wound Cleanser 1 x Per Week/7 Days Discharge Instructions: Cleanse the wound with wound cleanser or normal saline prior to applying a clean dressing using gauze sponges, not tissue or cotton balls. Peri-Wound Care: Sween Lotion (Moisturizing lotion) (Home Health) 1 x Per Week/7 Days Discharge Instructions: Apply moisturizing lotion as  directed Prim Dressing: Promogran Prisma Matrix, 4.34 (sq in) (silver collagen) 1 x Per Week/7 Days ary Discharge Instructions: Moisten collagen with saline or hydrogel Secondary Dressing: Woven Gauze Sponge, Non-Sterile 4x4 in 1 x Per Week/7 Days Discharge Instructions: Apply over primary dressing as directed. Secondary Dressing: Optifoam Non-Adhesive Dressing, 4x4 in 1 x Per Week/7 Days Discharge Instructions: Foam donut to help offload Compression Wrap: Kerlix Roll 4.5x3.1 (in/yd) 1 x Per Week/7 Days Discharge Instructions: Apply Kerlix and Coban compression as directed. Compression Wrap: Coban Self-Adherent Wrap 4x5 (in/yd) 1 x Per Week/7 Days Discharge Instructions: Apply over Kerlix as directed. Electronic Signature(s) Signed: 08/13/2021 3:46:44 PM By: Linton Ham MD Signed: 08/13/2021 4:13:46 PM By: Baruch Gouty RN, BSN Entered By: Baruch Gouty on 08/13/2021 14:59:23 -------------------------------------------------------------------------------- Problem List Details Patient Name: Date of Service: [name], [name] 08/13/2021 2:15 PM Medical Record Number: [id number] Patient Account Number: [id number] Date of Birth/Sex: Treating RN: August 01, 1961 (60 y.o. [name] Primary Care Provider: Charlott Rakes Other Clinician: Referring Provider: Treating Provider/Extender: Johnn Hai, Jairo Ben in Treatment: 25 Active Problems ICD-10 Encounter Code Description Active Date MDM Diagnosis S91.301D Unspecified open wound, right foot, subsequent encounter 05/31/2021 No Yes 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 2 diabetes mellitus with foot ulcer 02/19/2021 No Yes E11.51 Type 2 diabetes mellitus with diabetic peripheral angiopathy without gangrene 02/19/2021 No Yes I87.2 Venous insufficiency (chronic) (peripheral) 06/18/2021 No Yes Inactive Problems ICD-10 Code Description Active Date Inactive Date L97.811 Non-pressure chronic ulcer of other part of right lower leg limited to  breakdown of skin 02/19/2021 02/19/2021 S81.802A Unspecified open wound, left lower leg, initial encounter 06/18/2021 06/18/2021 S40.812D Abrasion of left upper arm, subsequent encounter 07/20/2021 07/20/2021 Resolved Problems ICD-10 Code Description Active Date Resolved Date S40.811D Abrasion of right upper arm, subsequent encounter 03/29/2021 03/29/2021 Electronic Signature(s) Signed: 08/13/2021 3:46:44 PM By: Linton Ham MD Entered By: Linton Ham on 08/13/2021 15:22:37 -------------------------------------------------------------------------------- Progress Note Details Patient Name: Date of Service: [name], [name] 08/13/2021 2:15 PM Medical Record Number: [id number] Patient Account Number: [id number] Date of Birth/Sex: Treating RN: Jun 06, 1961 (60 y.o. [name] Primary Care Provider: Charlott Rakes Other Clinician: Referring Provider: Treating Provider/Extender: Johnn Hai, [name] in Treatment: 25 Subjective History of Present Illness (HPI) This 60 year old patient who has a very long  significant history of diabetes mellitus, previous alcohol and nicotine abuse, chronic data disease, COPD, diabetes mellitus, height hypertension, critical lower limb ischemia with several wounds being managed at the wound Center at [facility] for over a year. She was recently in the ER at [facility] and was referred to our center. He has had a long history of critical limb ischemia and over a period of time has had balloon angioplasties in March 2016, endarterectomy of the left carotid, lower extremity angiogram and treatment by Dr. Quay Burow, several cardiac catheterizations. Most recently she had an x-ray of her left foot while in the ER which showed no acute abnormality. during this ER visit she was started on ciprofloxacin and asked to continue with the wound care physicians at [facility]. Her last ABI done in June 2017 showed the right side to be 0.28 in  the left side to be 0.48. Her right toe brachial index was 0.17 on the right and 0.27 on the left. her last hemoglobin A1c was 11.1% She continues to smoke about a pack of cigarettes a day. 03/28/2017 -- -- right foot x-ray -- IMPRESSION:Areas of soft tissue swelling. Mild subluxation second PIP joint. No frank dislocation or fracture. No erosive change or bony destruction. No soft tissue ulceration or radiopaque foreign body evident by radiography. There is plantar fascia calcification with a nearby inferior calcaneal spur. There are foci of arterial vascular calcification. 04/04/2017 -- the patient continues to be noncompliant and continues to complain of a lot of pain and has not done anything about quitting smoking Readmission: 06/26/18 on evaluation today patient presents for reevaluation she has not been seen in our office since May 2018. Since she was last seen here in our office she has undergone testing at Dr. Fransico Michael office where it was revealed that she had an ABI of 0.68 with her previous ABI being 0.64 and a left ABI of 0.38 with previous ABI being 0.34 this study which was performed on 04/05/18 was pretty much equivalent to the study performed on 11/22/17. The findings in the end revealed on the right would appear to be moderate right lower extremity arterial disease on the left Dr. Gwenlyn Found states moderate in the report but unfortunately this appears to be much more severe at 0.38 compared to the right. Subsequently the patient was scheduled to have angiography with Dr. Gwenlyn Found on 04/12/18. This was however canceled due to the fact that the patient was found to have chronic kidney disease stage III and it was to the point that he did not feel that it will be safe to pursue angiography at that point. She has not been on any antibiotics recently. At this point Dr. Gwenlyn Found has not rescheduled anything as far as the injured Kieler according to the patient he is somewhat reluctant to do so.  Nonetheless with her diminished blood flow this is gonna make it somewhat difficult for her to heal. 07/05/18; this is a patient I have not seen previously. She has very significant PAD as noted above. She apparently has had revascularization efforts by Dr. Gwenlyn Found on the right on 3 occasions to the patient. She was supposed to have an attempted angiography on the left however this was canceled apparently because of stage III chronic renal failure. I will need to research all of this. She complains of significant pain in the wound and has claudication enough that when she walks to the end of the driveway she has to stop. She is been  using silver alginate to the wounds on her legs and Iodoflex to the area on her left second toe. 07/13/18 on evaluation today patient's wounds actually appear to be doing about the same. She has an appointment she tells me within the next month that is September 2019 with Dr. Gwenlyn Found to discuss options to see if there's anything else that you can recommend or do for her. Nonetheless obviously what we're trying to do is do what we can to save her leg and in turn prevent any additional worsening or damage. None in the meantime we been mainly trying to manage her ulcers as best we can. 07/27/18; some improvement in the multiplicity of wounds on her left lower extremity and foot. She's been using silver alginate. I was unable to determine that she actually has an appointment with Dr. Gwenlyn Found. We are checking into this The patient's wound includes Left lateral foot, left plantar heel, her left anterior calf wound looks close to me, left fourth toe is very close to closed and the left medial calf is perhaps the largest open area READMISSION 02/19/2021 This is a now 60 year old woman with type 2 diabetes and continued cigarette smoking. She has known PAD. She was last in this clinic in 2019 at that point with wounds on her left foot. She left in a nonhealed state. On 06/28/2019 I see she  had a left femoropopliteal by vein and vascular with an amputation of the fifth ray. These managed to heal over. Her last arterial studies were in February 2021. This showed an absent waveform at the posterior tibial artery on the right a dampened monophasic dorsalis pedis on the right of 0.44. On the left again the PTA TA was absent her dorsalis pedis was 0.99 triphasic and her great toe pressure was 0.65. This would have been after her revascularization. Once again she tells me that Dr. Gwenlyn Found has done revascularization on the right leg on 3 different occasions. She has a right transmetatarsal amputation apparently done by Dr. Sharol Given remotely but I do not see a note for these right lower extremity revascularization but I may not of looked back far enough. In any case she says that the she has a wound on the plantar aspect of the right transmetatarsal amputation site there is been there for several months. More recently she fell and had a wound on her right medial lower leg she had 5 sutures placed in the ER and then subsequently she has developed an area on the medial lower leg which was a blister that opened up. Past medical history includes type 2 diabetes, PAD, chronic renal failure, congestive heart failure, right transmetatarsal amputation, left fifth ray amputation, continued tobacco abuse, atrial fibrillation and cirrhosis. We did not attempt an ABI on the right leg today because of pain 02/26/2021; patient we admitted to the clinic last week. She has what looks to be 2 areas on her right medial and right anterior lower leg that look more like venous wounds but she has 1 on the first met head at the base of her right transmet. She has known severe PAD. She complains of a lot of pain although some of this may be neuropathic. Is difficult to exclude a component of claudication. Use silver alginate on the wounds on her legs and Iodoflex on the area on the first met head. She has an appointment with  Dr. Gwenlyn Found on 5/25 although I will text him and discussed the situation. She is have poorly controlled diabetic. She  has has stage IIIb chronic renal failure 4/22; patient presents for 1 week follow-up. The 2 areas on her right medial and right anterior lower leg appear well-healing. She has been using silver alginate to this area without issues. She has a first met head ulcer and it is unclear how long this has been there as it was discovered in the ED earlier this month when she was being evaluated for another issue. Iodoflex has been used at this area. 4/29; patient presents for 1 week follow-up. She has been using silver alginate to the leg wounds and Iodoflex to the plantar foot wound. She has had this wrapped with Coban and Kerlix. She has no concerns or complaints today. 5/6; this is a difficult wound on the right plantar foot transmit site. We are not making a lot of progress. She had 2 more venous looking wounds on the right medial and right anterior lower leg 1 of which is healed. Her appointment with Dr. Gwenlyn Found is on 5/25 5/16; she did not tolerate the offloading shoe we gave her in fact she had a fall with an abrasion on her right forearm she is now back in the modified small shoe which does not offload her foot properly. Her appoint with Dr. Gwenlyn Found is still on 5/25 we have been using Iodoflex. The area on the right anterior lower leg is healed 7/18; patient presents for follow-up however has not been seen in 2 months. She was last seen at the end of May. She had a fall at the end of June and was hospitalized. She has been unable to follow-up since then. For the wound she has only been keeping it covered with gauze. She is scheduled for an aortogram with lower extremity runoff at the end of July. 7/29; patient presents for 2-week follow-up. She has home health and they have been changing her dressings. She denies any issues and has no complaints today. She states she had a procedure where  they opened up one of her vessels in her legs. She denies signs of infection. 8/5; patient presents for follow-up. She was recently in the hospital for bradycardia. She was noted to have cellulitis to the left lower extremity and Unna boot was placed due to blisters and increased swelling. She reports improvement to her left leg since being in the hospital and stability check her right plantar foot wound. She denies signs of infection. 8/23; since I last saw this patient a lot has happened. She still has the area over the right first met head in the setting of previous transmetatarsal amputation. Firstly most importantly she underwent revascularization of her occluded right SFA. She underwent directional atherectomy followed by drug-coated balloon angioplasty. She has no named vessel below the knee hopefully the revascularization of the right SFA will improve collateral flow. It is not felt however that she has any endovascular options. She had follow-up arterial studies noninvasive on 8/9. These showed an ABI of 0.44 at the right PTA. Monophasic waveforms. On the left her great toe ABI was 0.68. Her follow-up arterial Doppler showed a 50 to 74% stenosis in the proximal SFA and a 50 to 74% stenosis in the distal SFA mild to severe atherosclerosis noted throughout the extremity. Areas of shadowing plaque seen, unable to rule out higher grade stenosis she also had a fall apparently wearing a right foot forefoot off loader that we gave her. She therefore comes in in an ordinary running shoe today. Not certain if this is the fall that  ended up in the hospital with bradycardia 9/6 area over the right first metatarsal head in the setting of her previous amputation and severe PAD. She is also a diabetic with known PAD status post attempt at revascularization. She is continued smoker Again the separation of visits in the clinic is somewhat disturbing if we are going to consider her for a total contact cast.  She is not wearing anything to offload this stating it causes imbalance especially the forefoot off loader. She basically comes in in bedroom slippers She also had a fall this morning about an hour ago. She has a skin tear on the left dorsal forearm 9/13; areas over the right first metatarsal head in the setting of previous TMA and severe PAD. Again she comes in here in slippers. We have been using Hydrofera Blue. We use MolecuLight on this which was essentially negative study 9/20; right first metatarsal head in the setting of her previous TMA and severe PAD. Same slipper type shoes. She says she cannot wear a forefoot off loader and for some reason she will not wear surgical shoes. She says she is smoking half a pack per day. 9/30; right first metatarsal head. Absolutely no improvement in fact there is tunneling superiorly very close to bone. She does not offload this properly at all. We use MolecuLight on this 2 weeks ago that did not show any surface bacteria we have been using silver collagen is a dressing She tells me she is down to 3 cigarettes a day I have offered encouragement and a treatment plan Objective Constitutional Sitting or standing Blood Pressure is within target range for patient.. Pulse regular and within target range for patient.[name] Respirations regular, non-labored and within target range.. Temperature is normal and within the target range for the patient.[name] Appears in no distress. Vitals Time Taken: 2:22 PM, Height: 64 in, Source: Stated, Weight: 222 lbs, Source: Stated, BMI: 38.1, Temperature: 98.5 F, Pulse: 94 bpm, Respiratory Rate: 20 breaths/min, Blood Pressure: 112/82 mmHg, Capillary Blood Glucose: 225 mg/dl. General Notes: glucose per pt report this am Cardiovascular Pedal pulses are nonpalpable however foot is not cold. General Notes: Wound exam; right first metatarsal head. Previous TMA. Not so much skin and callus around this however she has a friable surface of  this with a tunneling area discovered by her intake nurse superiorly. I think this is in the same area that we had undermining previously. This does not seem to go to bone however it is certainly not far off it. No evidence of surrounding infection Integumentary (Hair, Skin) Wound #16 status is Open. Original cause of wound was Gradually Appeared. The date acquired was: 02/05/2021. The wound has been in treatment 25 weeks. The wound is located on the Right,Plantar Amputation Site - Transmetatarsal. The wound measures 0.7cm length x 1.5cm width x 0.7cm depth; 0.825cm^2 area and 0.577cm^3 volume. There is Fat Layer (Subcutaneous Tissue) exposed. There is no tunneling noted, however, there is undermining starting at 6:00 and ending at 3:00 with a maximum distance of 1.1cm. There is a medium amount of serosanguineous drainage noted. The wound margin is thickened. There is medium (34-66%) red granulation within the wound bed. There is a medium (34-66%) amount of necrotic tissue within the wound bed including Adherent Slough. Assessment Active Problems ICD-10 Unspecified open wound, right foot, subsequent encounter Type 2 diabetes mellitus with foot ulcer Type 2 diabetes mellitus with diabetic peripheral angiopathy without gangrene Venous insufficiency (chronic) (peripheral) Plan Follow-up Appointments: Return Appointment in 1 week. -  with Dr. Arcola Jansky Shower/ Hygiene: May shower with protection but do not get wound dressing(s) wet. Edema Control - Lymphedema / SCD / Other: Elevate legs to the level of the heart or above for 30 minutes daily and/or when sitting, a frequency of: - throughout the day Avoid standing for long periods of time. Patient to wear own compression stockings every day. - left leg Exercise regularly Off-Loading: Open toe surgical shoe to: - with felt padding to offload Additional Orders / Instructions: Stop/Decrease Smoking Follow Nutritious Diet WOUND #16: -  Amputation Site - Transmetatarsal Wound Laterality: Plantar, Right Cleanser: Soap and Water 1 x Per Week/7 Days Discharge Instructions: May shower and wash wound with dial antibacterial soap and water prior to dressing change. Cleanser: Wound Cleanser 1 x Per Week/7 Days Discharge Instructions: Cleanse the wound with wound cleanser or normal saline prior to applying a clean dressing using gauze sponges, not tissue or cotton balls. Peri-Wound Care: Sween Lotion (Moisturizing lotion) (Home Health) 1 x Per Week/7 Days Discharge Instructions: Apply moisturizing lotion as directed Prim Dressing: Promogran Prisma Matrix, 4.34 (sq in) (silver collagen) 1 x Per Week/7 Days ary Discharge Instructions: Moisten collagen with saline or hydrogel Secondary Dressing: Woven Gauze Sponge, Non-Sterile 4x4 in 1 x Per Week/7 Days Discharge Instructions: Apply over primary dressing as directed. Secondary Dressing: Optifoam Non-Adhesive Dressing, 4x4 in 1 x Per Week/7 Days Discharge Instructions: Foam donut to help offload Com pression Wrap: Kerlix Roll 4.5x3.1 (in/yd) 1 x Per Week/7 Days Discharge Instructions: Apply Kerlix and Coban compression as directed. Com pression Wrap: Coban Self-Adherent Wrap 4x5 (in/yd) 1 x Per Week/7 Days Discharge Instructions: Apply over Kerlix as directed. 1. Still silver collagen 2. Although she has severe PAD status post revascularization her foot is still warm. I am not certain whether she has enough blood flow to heal this wound. With this in mind I again strongly asked her to quit smoking however everybody at home also apparently smokes. 3. She came in in a surgical shoe which is an improvement from the usual bedroom slippers she comes in here. She did not tolerate a forefoot off loader because of gait and balance problems. 4. I am tempted to put her in a total contact cast however because of her blood flow and her balance problems I am somewhat concerned nevertheless I  think we are running out of options with this. Electronic Signature(s) Signed: 08/13/2021 3:46:44 PM By: Linton Ham MD Entered By: Linton Ham on 08/13/2021 15:29:35 -------------------------------------------------------------------------------- SuperBill Details Patient Name: Date of Service: [name], [name] 08/13/2021 Medical Record Number: [id number] Patient Account Number: [id number] Date of Birth/Sex: Treating RN: 04-Sep-1961 (60 y.o. [name] Primary Care Provider: Charlott Rakes Other Clinician: Referring Provider: Treating Provider/Extender: Johnn Hai, [name] in Treatment: 25 Diagnosis Coding ICD-10 Codes Code Description N81.771H Unspecified open wound, right foot, subsequent encounter 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 2 diabetes mellitus with foot ulcer E11.51 Type 2 diabetes mellitus with diabetic peripheral angiopathy without gangrene I87.2 Venous insufficiency (chronic) (peripheral) Facility Procedures CPT4 Code: [id number] Description: 99213 - WOUND CARE VISIT-LEV 3 EST PT Modifier: Quantity: 1 Physician Procedures : CPT4 Code Description Modifier [id number] 99213 - WC PHYS LEVEL 3 - EST PT ICD-10 Diagnosis Description S91.301D Unspecified open wound, right foot, subsequent encounter 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 2 diabetes mellitus with foot ulcer Quantity: 1 Electronic Signature(s) Signed: 08/13/2021 3:46:44 PM By: Linton Ham MD Entered By: Linton Ham on 08/13/2021 15:29:52

## 2021-08-13 NOTE — Telephone Encounter (Signed)
[name] with [employer] needs an oxygen order corrected. It does not say continuous or with exertion. Needs to say one or the other.  Please fax back:  Fax [phone]  Call back: [phone]

## 2021-08-16 ENCOUNTER — Other Ambulatory Visit (HOSPITAL_COMMUNITY): Payer: Self-pay

## 2021-08-16 ENCOUNTER — Other Ambulatory Visit: Payer: Self-pay

## 2021-08-16 ENCOUNTER — Ambulatory Visit (HOSPITAL_COMMUNITY)
Admission: RE | Admit: 2021-08-16 | Discharge: 2021-08-16 | Disposition: A | Discharge status: Home / Self Care | Payer: Medicare Other | Source: Ambulatory Visit | Attending: Family Medicine | Admitting: Family Medicine

## 2021-08-16 ENCOUNTER — Encounter (HOSPITAL_COMMUNITY): Payer: Self-pay

## 2021-08-16 VITALS — BP 158/78 | HR 97 | Wt 214.6 lb

## 2021-08-16 DIAGNOSIS — Z7985 Long-term (current) use of injectable non-insulin antidiabetic drugs: Secondary | ICD-10-CM | POA: Diagnosis not present

## 2021-08-16 DIAGNOSIS — I480 Paroxysmal atrial fibrillation: Secondary | ICD-10-CM

## 2021-08-16 DIAGNOSIS — Z713 Dietary counseling and surveillance: Secondary | ICD-10-CM | POA: Diagnosis not present

## 2021-08-16 DIAGNOSIS — I10 Essential (primary) hypertension: Secondary | ICD-10-CM

## 2021-08-16 DIAGNOSIS — Z7952 Long term (current) use of systemic steroids: Secondary | ICD-10-CM | POA: Insufficient documentation

## 2021-08-16 DIAGNOSIS — I2510 Atherosclerotic heart disease of native coronary artery without angina pectoris: Secondary | ICD-10-CM | POA: Diagnosis not present

## 2021-08-16 DIAGNOSIS — E1122 Type 2 diabetes mellitus with diabetic chronic kidney disease: Secondary | ICD-10-CM | POA: Insufficient documentation

## 2021-08-16 DIAGNOSIS — I5032 Chronic diastolic (congestive) heart failure: Secondary | ICD-10-CM | POA: Diagnosis not present

## 2021-08-16 DIAGNOSIS — E1151 Type 2 diabetes mellitus with diabetic peripheral angiopathy without gangrene: Secondary | ICD-10-CM | POA: Diagnosis not present

## 2021-08-16 DIAGNOSIS — I130 Hypertensive heart and chronic kidney disease with heart failure and stage 1 through stage 4 chronic kidney disease, or unspecified chronic kidney disease: Secondary | ICD-10-CM | POA: Insufficient documentation

## 2021-08-16 DIAGNOSIS — Z7951 Long term (current) use of inhaled steroids: Secondary | ICD-10-CM | POA: Diagnosis not present

## 2021-08-16 DIAGNOSIS — N1831 Chronic kidney disease, stage 3a: Secondary | ICD-10-CM

## 2021-08-16 DIAGNOSIS — J449 Chronic obstructive pulmonary disease, unspecified: Secondary | ICD-10-CM | POA: Diagnosis not present

## 2021-08-16 DIAGNOSIS — L97519 Non-pressure chronic ulcer of other part of right foot with unspecified severity: Secondary | ICD-10-CM | POA: Diagnosis not present

## 2021-08-16 DIAGNOSIS — Z794 Long term (current) use of insulin: Secondary | ICD-10-CM | POA: Insufficient documentation

## 2021-08-16 DIAGNOSIS — Z79899 Other long term (current) drug therapy: Secondary | ICD-10-CM | POA: Diagnosis not present

## 2021-08-16 DIAGNOSIS — M79673 Pain in unspecified foot: Secondary | ICD-10-CM | POA: Insufficient documentation

## 2021-08-16 DIAGNOSIS — N183 Chronic kidney disease, stage 3 unspecified: Secondary | ICD-10-CM | POA: Insufficient documentation

## 2021-08-16 DIAGNOSIS — E11621 Type 2 diabetes mellitus with foot ulcer: Secondary | ICD-10-CM | POA: Diagnosis not present

## 2021-08-16 DIAGNOSIS — Z79890 Hormone replacement therapy (postmenopausal): Secondary | ICD-10-CM | POA: Insufficient documentation

## 2021-08-16 DIAGNOSIS — G4733 Obstructive sleep apnea (adult) (pediatric): Secondary | ICD-10-CM | POA: Diagnosis not present

## 2021-08-16 DIAGNOSIS — F17210 Nicotine dependence, cigarettes, uncomplicated: Secondary | ICD-10-CM | POA: Diagnosis not present

## 2021-08-16 DIAGNOSIS — I739 Peripheral vascular disease, unspecified: Secondary | ICD-10-CM

## 2021-08-16 DIAGNOSIS — K7460 Unspecified cirrhosis of liver: Secondary | ICD-10-CM | POA: Insufficient documentation

## 2021-08-16 DIAGNOSIS — Z8249 Family history of ischemic heart disease and other diseases of the circulatory system: Secondary | ICD-10-CM | POA: Diagnosis not present

## 2021-08-16 DIAGNOSIS — Z8719 Personal history of other diseases of the digestive system: Secondary | ICD-10-CM

## 2021-08-16 DIAGNOSIS — Z7902 Long term (current) use of antithrombotics/antiplatelets: Secondary | ICD-10-CM | POA: Insufficient documentation

## 2021-08-16 DIAGNOSIS — Z7901 Long term (current) use of anticoagulants: Secondary | ICD-10-CM | POA: Insufficient documentation

## 2021-08-16 DIAGNOSIS — Z720 Tobacco use: Secondary | ICD-10-CM

## 2021-08-16 MED ORDER — LOSARTAN POTASSIUM 25 MG PO TABS: 50.0000 mg | ORAL_TABLET | Freq: Every day | ORAL | 1 refills | Status: DC

## 2021-08-16 NOTE — Progress Notes (Signed)
Paramedicine Encounter   Patient ID: [name] , female,   DOB: 02/16/1961,59 y.o.,  MRN: [id number]   Met patient in clinic today with provider.   Weight @ clinic-214 B/p-158/78 P-97 Sp02-96 EKG-NSR  Losartan being added back on at 62m. Not sure what happened to it from her list.   She missed a call from the kidney doc so she has to call to resch that.  Just keep check on any afib s/s.   Labs back in 10 days.     KMarylouise Stacks EHarrison10/01/2021

## 2021-08-16 NOTE — Patient Instructions (Signed)
Your physician recommends that you return for lab work in: 10 days  RESTART Losartan 50 mg 2 tablets daily   At the [facility], you and your health needs are our priority. As part of our continuing mission to provide you with exceptional heart care, we have created designated Provider Care Teams. These Care Teams include your primary Cardiologist (physician) and Advanced Practice Providers (APPs- Physician Assistants and Nurse Practitioners) who all work together to provide you with the care you need, when you need it.   You may see any of the following providers on your designated Care Team at your next follow up: Dr Glori Bickers Dr Loralie Champagne Dr Patrice Paradise, NP Lyda Jester, [place] Ginnie Smart Audry Riles, PharmD   Please be sure to bring in all your medications bottles to every appointment.    If you have any questions or concerns before your next appointment please send [place] a message through [place] or call our office at [phone].    TO LEAVE A MESSAGE FOR THE NURSE SELECT OPTION 2, PLEASE LEAVE A MESSAGE INCLUDING: YOUR NAME DATE OF BIRTH CALL BACK NUMBER REASON FOR CALL**this is important as we prioritize the call backs  YOU WILL RECEIVE A CALL BACK THE SAME DAY AS LONG AS YOU CALL BEFORE 4:00 PM

## 2021-08-17 ENCOUNTER — Telehealth: Payer: Self-pay | Admitting: Family Medicine

## 2021-08-17 DIAGNOSIS — J9611 Chronic respiratory failure with hypoxia: Secondary | ICD-10-CM

## 2021-08-17 NOTE — Telephone Encounter (Signed)
[name] from [employer] called in states needs verbal orders for oxygent, if its exertion or contious. She says she will hold the order until tomorrow. Please call back or fax over.

## 2021-08-18 NOTE — Telephone Encounter (Signed)
Oxygen order is for continuous therapy.

## 2021-08-19 ENCOUNTER — Encounter (HOSPITAL_BASED_OUTPATIENT_CLINIC_OR_DEPARTMENT_OTHER): Payer: Medicare Other | Attending: Internal Medicine | Admitting: Internal Medicine

## 2021-08-19 ENCOUNTER — Other Ambulatory Visit: Payer: Self-pay

## 2021-08-19 ENCOUNTER — Other Ambulatory Visit (HOSPITAL_COMMUNITY): Payer: Self-pay

## 2021-08-19 DIAGNOSIS — I872 Venous insufficiency (chronic) (peripheral): Secondary | ICD-10-CM | POA: Diagnosis not present

## 2021-08-19 DIAGNOSIS — E1151 Type 2 diabetes mellitus with diabetic peripheral angiopathy without gangrene: Secondary | ICD-10-CM | POA: Diagnosis not present

## 2021-08-19 DIAGNOSIS — E1169 Type 2 diabetes mellitus with other specified complication: Secondary | ICD-10-CM | POA: Insufficient documentation

## 2021-08-19 DIAGNOSIS — E1122 Type 2 diabetes mellitus with diabetic chronic kidney disease: Secondary | ICD-10-CM | POA: Insufficient documentation

## 2021-08-19 DIAGNOSIS — M86671 Other chronic osteomyelitis, right ankle and foot: Secondary | ICD-10-CM | POA: Diagnosis not present

## 2021-08-19 DIAGNOSIS — X58XXXD Exposure to other specified factors, subsequent encounter: Secondary | ICD-10-CM | POA: Insufficient documentation

## 2021-08-19 DIAGNOSIS — N183 Chronic kidney disease, stage 3 unspecified: Secondary | ICD-10-CM | POA: Insufficient documentation

## 2021-08-19 DIAGNOSIS — S91301D Unspecified open wound, right foot, subsequent encounter: Secondary | ICD-10-CM | POA: Diagnosis not present

## 2021-08-19 DIAGNOSIS — F17210 Nicotine dependence, cigarettes, uncomplicated: Secondary | ICD-10-CM | POA: Insufficient documentation

## 2021-08-19 MED ORDER — MISC. DEVICES MISC: 0 refills | Status: DC

## 2021-08-19 NOTE — Progress Notes (Signed)
Paramedicine Encounter    Patient ID: [name], female    DOB: [birth year], 60 y.o.   MRN: [id number]   Patient Care Team: Charlott Rakes, MD as PCP - General (Family Medicine) [name], MD as PCP - Cardiology (Cardiology) [name], MD as PCP - Electrophysiology (Cardiology) [name], MD as Consulting Physician (Cardiology) [name], MD as Consulting Physician (Pulmonary Disease) System, Provider Not In  Patient Active Problem List   Diagnosis Date Noted   Hyperlipidemia associated with type 2 diabetes mellitus ([place]) 07/29/2021   (HFpEF) heart failure with preserved ejection fraction ([place]) 07/29/2021   Bradycardia 06/13/2021   Uncontrolled type 2 diabetes mellitus with hyperglycemia ([place]) 06/13/2021   Acute kidney injury superimposed on CKD ([place]) 06/13/2021   Critical ischemia of foot ([place]) 06/07/2021   Fall 05/05/2021   PAF (paroxysmal atrial fibrillation) ([place]) 05/05/2021   COPD (chronic obstructive pulmonary disease) ([place]) 05/05/2021   DM (diabetes mellitus), type 2 with complications ([place]) 38/25/0539   PAD (peripheral artery disease) ([place]) 05/05/2021   Cellulitis 05/05/2021   Acute on chronic combined systolic and diastolic CHF (congestive heart failure) ([place]) 05/05/2021   OSA (obstructive sleep apnea) 05/05/2021   CKD (chronic kidney disease) stage 3, GFR 30-59 ml/min ([place]) 05/05/2021   AKI (acute kidney injury) ([place]) 05/04/2021   Pressure injury of skin 05/04/2021   Ulcer of foot, unspecified laterality, limited to breakdown of skin ([place]) 02/17/2021   Altered mental status 01/02/2021   AF (paroxysmal atrial fibrillation) ([place]) 01/02/2021   CAD (coronary artery disease) 01/02/2021   PVD (peripheral vascular disease) ([place]) 01/02/2021   Acute renal failure superimposed on stage 4 chronic kidney disease ([place]) 01/02/2021   Diabetes mellitus type 2, uncontrolled, with complications 76/73/4193   Diabetic nephropathy ([place]) 01/02/2021   Low  back pain 06/01/2020   Hyperlipidemia 04/03/2020   Muscle weakness 03/20/2020   Closed fracture of neck of right radius 03/13/2020   Pain in elbow 03/12/2020   PAD (peripheral artery disease) ([place]) 12/26/2019   Acute renal failure ([place])    Acute respiratory failure with hypoxia ([place]) 12/02/2019   Acute exacerbation of CHF (congestive heart failure) ([place]) 12/01/2019   Cellulitis in diabetic foot ([place]) 07/08/2019   Osteomyelitis ([place]) 06/21/2019   NICM (nonischemic cardiomyopathy) ([place]) 06/20/2019   Non-healing ulcer ([place]) 06/20/2019   Acute CHF (congestive heart failure) ([place]) 11/26/2018   CHF (congestive heart failure) ([place]) 11/26/2018   Acute respiratory failure ([place]) 10/21/2018   AKI (acute kidney injury) ([place]) 08/18/2018   Coagulation disorder ([place]) 08/09/2017   Depression 07/21/2017   At risk for adverse drug reaction 06/20/2017   Peripheral neuropathy 06/20/2017   Acute osteomyelitis of right foot ([place]) 06/13/2017   S/P transmetatarsal amputation of foot, right ([place]) 06/05/2017   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 ([place]) 05/19/2017   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[place])    SIRS (systemic inflammatory response syndrome) ([place]) 04/06/2017   CKD (chronic kidney disease), stage III ([place]) 11/24/2016   Suspected sleep apnea 11/24/2016   Ulcer of toe of right foot, with necrosis of bone ([place]) 10/27/2016   Ulcer of left lower leg ([place]) 05/19/2016   Severe obesity (BMI >= 40) ([place]) 02/24/2016   COPD GOLD 0  02/24/2016   Morbid obesity ([place]) 02/24/2016   Encounter for therapeutic drug monitoring 02/10/2016   Symptomatic bradycardia 01/12/2016   Hypertension associated with diabetes ([place]) 12/22/2015   Chronic combined systolic and diastolic CHF (congestive heart failure) (  [place])    [place]    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 11/08/2015   Tobacco abuse 10/23/2015   Coronary artery disease    DOE (dyspnea on exertion) 04/29/2015    Diabetes mellitus type 2, uncontrolled 02/08/2015   Influenza A 02/07/2015   Wrist fracture, left, with routine healing, subsequent encounter 02/05/2015   Wrist fracture, left, closed, initial encounter 01/29/2015   PAF (paroxysmal atrial fibrillation) ([employer]) 01/16/2015   Carotid arterial disease ([place]) 01/16/2015   Claudication ([place]) 01/15/2015   Demand ischemia ([place]) 10/29/2014   Insomnia 02/03/2014   COPD with acute exacerbation ([place]) 02/01/2014   S/P peripheral artery angioplasty - [place]boHawk atherectomy; R SFA 09/11/2013    Class: Acute   Leg pain, bilateral 08/19/2013   Hypothyroidism 07/31/2013   Cellulitis 06/13/2013   History of cocaine abuse ([place]) 06/13/2013   Long term current use of anticoagulant therapy 05/20/2013   Alcohol abuse    Narcotic abuse ([place])    Marijuana abuse    Alcoholic cirrhosis ([name])    DM (diabetes mellitus), type 2 with peripheral vascular complications ([name])     Current Outpatient Medications:    ACCU-CHEK GUIDE test strip, USE TO CHECK BLOOD SUGAR FOUR TIMES DAILY, Disp: , Rfl:    acetaminophen (TYLENOL) 500 MG tablet, Take 500 mg by mouth every 6 (six) hours as needed for moderate pain., Disp: , Rfl:    albuterol (VENTOLIN HFA) 108 (90 Base) MCG/ACT inhaler, Inhale 2 puffs into the lungs every 6 (six) hours as needed for wheezing or shortness of breath., Disp: 18 g, Rfl: 3   allopurinol (ZYLOPRIM) 100 MG tablet, Take 1 tablet (100 mg total) by mouth daily., Disp: 30 tablet, Rfl: 6   amiodarone (PACERONE) 200 MG tablet, Take 0.5 tablets (100 mg total) by mouth daily., Disp: 45 tablet, Rfl: 3   amLODipine (NORVASC) 10 MG tablet, Take 1 tablet (10 mg total) by mouth daily., Disp: 90 tablet, Rfl: 3   apixaban (ELIQUIS) 5 MG TABS tablet, Take 5 mg by mouth 2 (two) times daily., Disp: , Rfl:    atorvastatin (LIPITOR) 40 MG tablet, Take 40 mg by mouth every evening., Disp: , Rfl:    benzonatate (TESSALON) 100 MG capsule, Take 1 capsule (100 mg total) by mouth  2 (two) times daily as needed for cough., Disp: 20 capsule, Rfl: 0   budesonide-formoterol (SYMBICORT) 160-4.5 MCG/ACT inhaler, Inhale 2 puffs into the lungs 2 (two) times daily., Disp: 1 Inhaler, Rfl: 2   clopidogrel (PLAVIX) 75 MG tablet, Take 1 tablet (75 mg total) by mouth daily with breakfast., Disp: 90 tablet, Rfl: 1   colchicine 0.6 MG tablet, Take 2 tabs (1.2 mg) at the onset of a gout flare, may take 1 tab (0.6 mg) in 1 hour if symptoms, Disp: 30 tablet, Rfl: 3   Continuous Blood Gluc Sensor (FREESTYLE LIBRE 2 SENSOR) MISC, USE 1 (ONE) EACH EVERY 2 WEEKS, Disp: 2 each, Rfl: 11   Dulaglutide (TRULICITY) 1.5 0000000 SOPN, Inject 1.5 mg into the skin once a week., Disp: 2 mL, Rfl: 6   empagliflozin (JARDIANCE) 10 MG TABS tablet, Take 1 tablet (10 mg total) by mouth daily before breakfast., Disp: 90 tablet, Rfl: 3   FLUoxetine (PROZAC) 20 MG capsule, Take 1 capsule (20 mg total) by mouth at bedtime., Disp: 90 capsule, Rfl: 1   folic acid (FOLVITE) 1 MG tablet, Take 1 tablet (1 mg total) by mouth daily., Disp: 90 tablet, Rfl: 1   hydrALAZINE (APRESOLINE) 50 MG tablet, Take  1 tablet (50 mg total) by mouth 3 (three) times daily., Disp: 90 tablet, Rfl: 6   insulin glargine (LANTUS) 100 UNIT/ML Solostar Pen, Inject 30 Units into the skin daily., Disp: 30 mL, Rfl: 6   insulin lispro (HUMALOG) 100 UNIT/ML KwikPen, Inject 8 Units into the skin 3 (three) times daily with meals., Disp: 15 mL, Rfl: 0   Insulin Pen Needle 32G X 4 MM MISC, USE AS DIRECTED WITH INSULIN PENS, Disp: 100 each, Rfl: 0   levothyroxine (SYNTHROID, LEVOTHROID) 50 MCG tablet, Take 1 tablet (50 mcg total) by mouth daily., Disp: 30 tablet, Rfl: 0   losartan (COZAAR) 25 MG tablet, Take 2 tablets (50 mg total) by mouth daily., Disp: 180 tablet, Rfl: 1   Misc. Devices MISC, 2 L oxygen via nasal cannula.  Diagnosis chronic respiratory failure with hypoxia (Patient not taking: Reported on 08/16/2021), Disp: 1 each, Rfl: 0   nortriptyline  (PAMELOR) 25 MG capsule, Take 1 capsule (25 mg total) by mouth at bedtime. For 2 weeks then taper down to every other day for 2-weeks, Disp: 30 capsule, Rfl: 0   Omega-3 Fatty Acids (FISH OIL PO), Take 1 capsule by mouth daily., Disp: , Rfl:    ondansetron (ZOFRAN) 4 MG tablet, Take 1 tablet (4 mg total) by mouth every 8 (eight) hours as needed for nausea or vomiting., Disp: 20 tablet, Rfl: 0   pantoprazole (PROTONIX) 40 MG tablet, Take 1 tablet (40 mg total) by mouth daily., Disp:  , Rfl:    predniSONE (DELTASONE) 20 MG tablet, Take 2 tablets (40 mg total) by mouth daily with breakfast., Disp: 8 tablet, Rfl: 0   torsemide (DEMADEX) 20 MG tablet, Take 60-80 mg by mouth See admin instructions. Take 4 tablets by mouth in the morning, then take 3 tablets by mouth in the afternoon per patient, Disp: , Rfl:    VITAMIN D PO, Take 1 tablet by mouth daily., Disp: , Rfl:  Allergies  Allergen Reactions   Gabapentin Nausea And Vomiting and Other (See Comments)    POSSIBLE SHAKING   Lyrica [Pregabalin] Other (See Comments)    Shaking       Social History   Socioeconomic History   Marital status: Single    Spouse name: Not on file   Number of children: 1   Years of education: 12   Highest education level: 12th grade  Occupational History   Occupation: disabled  Tobacco Use   Smoking status: Every Day    Packs/day: 1.00    Years: 44.00    Pack years: 44.00    Types: E-cigarettes, Cigarettes   Smokeless tobacco: Former    Types: Snuff  Vaping Use   Vaping Use: Former   Devices: 11/26/2018 "stopped months ago"  Substance and Sexual Activity   Alcohol use: Yes    Comment: occ   Drug use: Yes    Types: "Crack" cocaine, Marijuana, Oxycodone   Sexual activity: Not Currently  Other Topics Concern   Not on file  Social History Narrative   ** Merged History Encounter **       Lives in [place], in motel with sister.  They are looking to move but don't have a place to go yet.     Social  Determinants of Health   Financial Resource Strain: Low Risk    Difficulty of Paying Living Expenses: Not very hard  Food Insecurity: Not on file  Transportation Needs: Not on file  Physical Activity: Not on file  Stress:  Not on file  Social Connections: Not on file  Intimate Partner Violence: Not on file    Physical Exam      Future Appointments  Date Time Provider [place]  08/19/2021  2:45 PM Ricard Dillon, MD [facility]  08/25/2021  1:45 PM [facility] None  10/01/2021  2:30 PM [name], MD [facility]  10/21/2021  1:40 PM Larey Dresser, MD [facility] None  11/16/2021  3:30 PM Charlott Rakes, MD [facility] None  12/22/2021  1:00 PM [facility] VASC 3 [facility]    LMP  (LMP Unknown)   Weight yesterday-? Last visit weight-214 @ clinic   Pt reports she is doing ok, she has to call her insurance to see about how to get the sensor for her arms. The local pharmacy is not able to fill it since she changed her insurance.  Pt denies any increased complaints.  Since it is close to her wound center appoint we having to cut visit short b/c she is having to go soon.  Losartan being restarted at '50mg'$  dose.  Meds verified and 2 wks worth of pill boxes refilled.  She needs amiodarone and folic acid that needs to be placed in pill box. Pharmacy will deliver tomor. I left a note to remind sister to place in pill box.   Refills needed-  Clopidogrel  Pantoprazole Torsemide Losartan-'50mg'$     [name], [place] Paramedic  08/19/21

## 2021-08-19 NOTE — Telephone Encounter (Signed)
Order has been fixed. Please fax to [place]. Thanks

## 2021-08-19 NOTE — Progress Notes (Signed)
NIANA, EKE ([id number]) Visit Report for 08/19/2021 Arrival Information Details Patient Name: Date of Service: [name] 08/19/2021 2:45 PM Medical Record Number: [id number] Patient Account Number: [id number] Date of Birth/Sex: Treating RN: 04/29/1961 (60 y.o. Martyn Malay, Linda Primary Care Sevin Langenbach: Charlott Rakes Other Clinician: Referring Render Marley: Treating Belford Pascucci/Extender: Ramon Dredge in Treatment: 25 Visit Information History Since Last Visit Added or deleted any medications: No Patient Arrived: Ambulatory Any new allergies or adverse reactions: No Arrival Time: 15:32 Had a fall or experienced change in No Accompanied By: self activities of daily living that may affect Transfer Assistance: None risk of falls: Patient Identification Verified: Yes Signs or symptoms of abuse/neglect since last visito No Secondary Verification Process Completed: Yes Hospitalized since last visit: No Patient Requires Transmission-Based Precautions: No Implantable device outside of the clinic excluding No Patient Has Alerts: No cellular tissue based products placed in the center since last visit: Has Dressing in Place as Prescribed: Yes Has Compression in Place as Prescribed: Yes Pain Present Now: No Electronic Signature(s) Signed: 08/19/2021 6:17:04 PM By: Baruch Gouty RN, BSN Entered By: Baruch Gouty on 08/19/2021 15:34:29 -------------------------------------------------------------------------------- Encounter Discharge Information Details Patient Name: Date of Service: [name], [name] 08/19/2021 2:45 PM Medical Record Number: [id number] Patient Account Number: [id number] Date of Birth/Sex: Treating RN: [birth year] (60 y.o. [name] Primary Care Ambrosio Reuter: Charlott Rakes Other Clinician: Referring Jajaira Ruis: Treating Royalty Fakhouri/Extender: Ramon Dredge in Treatment: 25 Encounter Discharge Information Items  Post Procedure Vitals Discharge Condition: Stable Temperature (F): 98.0 Ambulatory Status: Ambulatory Pulse (bpm): 92 Discharge Destination: Home Respiratory Rate (breaths/min): 20 Transportation: Private Auto Blood Pressure (mmHg): 151/75 Accompanied By: alone Schedule Follow-up Appointment: Yes Clinical Summary of Care: Patient Declined Electronic Signature(s) Signed: 08/19/2021 6:21:03 PM By: Levan Hurst RN, BSN Entered By: Levan Hurst on 08/19/2021 16:41:51 -------------------------------------------------------------------------------- Lower Extremity Assessment Details Patient Name: Date of Service: [name], [name] 08/19/2021 2:45 PM Medical Record Number: [id number] Patient Account Number: [id number] Date of Birth/Sex: Treating RN: [birth year] (60 y.o. [name] Primary Care Keishawn Rajewski: Charlott Rakes Other Clinician: Referring Davita Sublett: Treating Kolleen Ochsner/Extender: Sindy Guadeloupe Weeks in Treatment: 25 Edema Assessment Assessed: [Left: No] [Right: No] Edema: [Left: Ye] [Right: s] Calf Left: Right: Point of Measurement: From Medial Instep 37.5 cm Ankle Left: Right: Point of Measurement: From Medial Instep 21.5 cm Vascular Assessment Pulses: Dorsalis Pedis Palpable: [Right:Yes] Electronic Signature(s) Signed: 08/19/2021 6:17:04 PM By: Baruch Gouty RN, BSN Entered By: Baruch Gouty on 08/19/2021 15:39:58 -------------------------------------------------------------------------------- Multi Wound Chart Details Patient Name: Date of Service: [name], [name] 08/19/2021 2:45 PM Medical Record Number: [id number] Patient Account Number: [id number] Date of Birth/Sex: Treating RN: 10/24/1961 (60 y.o. [name], Meta.Reding Primary Care Vester Balthazor: Charlott Rakes Other Clinician: Referring Maygen Sirico: Treating Jamaya Sleeth/Extender: Sindy Guadeloupe Weeks in Treatment: 25 Vital Signs Height(in): 64 Capillary Blood  Glucose(mg/dl): 241 Weight(lbs): 222 Pulse(bpm): 50 Body Mass Index(BMI): 26 Blood Pressure(mmHg): 151/75 Temperature(F): 98 Respiratory Rate(breaths/min): 20 Photos: [N/A:N/A] Right, Plantar Amputation Site - N/A N/A Wound Location: Transmetatarsal Gradually Appeared N/A N/A Wounding Event: Diabetic Wound/Ulcer of the Lower N/A N/A Primary Etiology: Extremity Chronic Obstructive Pulmonary N/A N/A Comorbid History: Disease (COPD), Sleep Apnea, Arrhythmia, Congestive Heart Failure, Coronary Artery Disease, Hypertension, Peripheral Arterial Disease, Cirrhosis , Type II Diabetes, Osteoarthritis, Neuropathy 02/05/2021 N/A N/A Date Acquired: 25 N/A N/A Weeks of Treatment: Open N/A N/A Wound Status: 0.7x1.3x0.6 N/A N/A Measurements L x W x D (cm) 0.715 N/A  N/A A (cm) : rea 0.429 N/A N/A Volume (cm) : 56.20% N/A N/A % Reduction in A rea: 12.40% N/A N/A % Reduction in Volume: 5 Starting Position 1 (o'clock): 12 Ending Position 1 (o'clock): 1.1 Maximum Distance 1 (cm): Yes N/A N/A Undermining: Grade 2 N/A N/A Classification: Medium N/A N/A Exudate A mount: Serosanguineous N/A N/A Exudate Type: red, brown N/A N/A Exudate Color: Thickened N/A N/A Wound Margin: Large (67-100%) N/A N/A Granulation A mount: Red, Pink N/A N/A Granulation Quality: Small (1-33%) N/A N/A Necrotic A mount: Fat Layer (Subcutaneous Tissue): Yes N/A N/A Exposed Structures: Bone: Yes Fascia: No Tendon: No Muscle: No Joint: No None N/A N/A Epithelialization: Treatment Notes Electronic Signature(s) Signed: 08/19/2021 5:45:05 PM By: Linton Ham MD Signed: 08/19/2021 6:25:33 PM By: Deon Pilling RN, BSN Entered By: Linton Ham on 08/19/2021 16:15:09 -------------------------------------------------------------------------------- Multi-Disciplinary Care Plan Details Patient Name: Date of Service: [name], [name] 08/19/2021 2:45 PM Medical Record Number:  [id number] Patient Account Number: [id number] Date of Birth/Sex: Treating RN: 12-Aug-1961 (60 y.o. [name] Primary Care Clarice Bonaventure: Charlott Rakes Other Clinician: Referring Nyilah Kight: Treating Alexiz Cothran/Extender: Ramon Dredge in Treatment: 25 [place] reviewed with physician Active Inactive Wound/Skin Impairment Nursing Diagnoses: Impaired tissue integrity Goals: Patient/caregiver will verbalize understanding of skin care regimen Date Initiated: 02/19/2021 Target Resolution Date: 09/09/2021 Goal Status: Active Ulcer/skin breakdown will have a volume reduction of 30% by week 4 Date Initiated: 02/19/2021 Date Inactivated: 03/29/2021 Target Resolution Date: 04/09/2021 Goal Status: Unmet Unmet Reason: PAD Interventions: Assess patient/caregiver ability to obtain necessary supplies Assess patient/caregiver ability to perform ulcer/skin care regimen upon admission and as needed Assess ulceration(s) every visit Provide education on ulcer and skin care Treatment Activities: Skin care regimen initiated : 02/19/2021 Topical wound management initiated : 02/19/2021 Notes: Electronic Signature(s) Signed: 08/19/2021 6:17:04 PM By: Baruch Gouty RN, BSN Entered By: Baruch Gouty on 08/19/2021 15:48:03 -------------------------------------------------------------------------------- Pain Assessment Details Patient Name: Date of Service: [name], [name] 08/19/2021 2:45 PM Medical Record Number: [id number] Patient Account Number: [id number] Date of Birth/Sex: Treating RN: [birth year] (60 y.o. [name] Primary Care Kacelyn Rowzee: Charlott Rakes Other Clinician: Referring Vinh Sachs: Treating Maloree Uplinger/Extender: Sindy Guadeloupe Weeks in Treatment: 25 Active Problems Location of Pain Severity and Description of Pain Patient Has Paino No Site Locations Rate the pain. Current Pain Level: 0 Pain Management and  Medication Current Pain Management: Electronic Signature(s) Signed: 08/19/2021 6:17:04 PM By: Baruch Gouty RN, BSN Entered By: Baruch Gouty on 08/19/2021 15:37:31 -------------------------------------------------------------------------------- Patient/Caregiver Education Details Patient Name: Date of Service: [name] 10/6/2022andnbsp2:45 PM Medical Record Number: [id number] Patient Account Number: [id number] Date of Birth/Gender: Treating RN: Mar 25, 1961 (60 y.o. [name] Primary Care Physician: Charlott Rakes Other Clinician: Referring Physician: Treating Physician/Extender: Ramon Dredge in Treatment: 25 Education Assessment Education Provided To: Patient Education Topics Provided Offloading: Methods: Explain/Verbal Responses: Reinforcements needed, State content correctly Wound/Skin Impairment: Methods: Explain/Verbal Responses: Reinforcements needed, State content correctly Electronic Signature(s) Signed: 08/19/2021 6:17:04 PM By: Baruch Gouty RN, BSN Entered By: Baruch Gouty on 08/19/2021 15:48:23 -------------------------------------------------------------------------------- Wound Assessment Details Patient Name: Date of Service: [name], [name] 08/19/2021 2:45 PM Medical Record Number: [id number] Patient Account Number: [id number] Date of Birth/Sex: Treating RN: [birth year] (60 y.o. [name] Primary Care Osualdo Hansell: Charlott Rakes Other Clinician: Referring Freeda Spivey: Treating Paco Cislo/Extender: Sindy Guadeloupe Weeks in Treatment: 25 Wound Status Wound Number: 16 Primary Diabetic Wound/Ulcer of the Lower Extremity Etiology: Wound  Location: Right, Plantar Amputation Site - Transmetatarsal Wound Open Wounding Event: Gradually Appeared Status: Date Acquired: 02/05/2021 Comorbid Chronic Obstructive Pulmonary Disease (COPD), Sleep Apnea, Weeks Of Treatment: 25 History: Arrhythmia,  Congestive Heart Failure, Coronary Artery Disease, Clustered Wound: No Hypertension, Peripheral Arterial Disease, Cirrhosis , Type II Diabetes, Osteoarthritis, Neuropathy Photos Wound Measurements Length: (cm) 0.7 Width: (cm) 1.3 Depth: (cm) 0.6 Area: (cm) 0.715 Volume: (cm) 0.429 % Reduction in Area: 56.2% % Reduction in Volume: 12.4% Epithelialization: None Undermining: Yes Starting Position (o'clock): 5 Ending Position (o'clock): 12 Maximum Distance: (cm) 1.1 Wound Description Classification: Grade 2 Wound Margin: Thickened Exudate Amount: Medium Exudate Type: Serosanguineous Exudate Color: red, brown Foul Odor After Cleansing: No Slough/Fibrino Yes Wound Bed Granulation Amount: Large (67-100%) Exposed Structure Granulation Quality: Red, Pink Fascia Exposed: No Necrotic Amount: Small (1-33%) Fat Layer (Subcutaneous Tissue) Exposed: Yes Necrotic Quality: Adherent Slough Tendon Exposed: No Muscle Exposed: No Joint Exposed: No Bone Exposed: Yes Treatment Notes 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 Wound Cleanser Discharge Instruction: Cleanse the wound with wound cleanser or normal saline prior to applying a clean dressing using gauze sponges, not tissue or cotton balls. Peri-Wound Care Sween Lotion (Moisturizing lotion) Discharge Instruction: Apply moisturizing lotion as directed Topical Primary Dressing Promogran Prisma Matrix, 4.34 (sq in) (silver collagen) Discharge Instruction: Moisten collagen with saline or hydrogel Secondary Dressing Woven Gauze Sponge, Non-Sterile 4x4 in Discharge Instruction: Apply over primary dressing as directed. Optifoam Non-Adhesive Dressing, 4x4 in Discharge Instruction: Foam donut to help offload Secured With Compression Wrap Kerlix Roll 4.5x3.1  (in/yd) Discharge Instruction: Apply Kerlix and Coban compression as directed. Coban Self-Adherent Wrap 4x5 (in/yd) Discharge Instruction: Apply over Kerlix as directed. Compression Stockings Add-Ons Electronic Signature(s) Signed: 08/19/2021 6:17:04 PM By: Baruch Gouty RN, BSN Signed: 08/19/2021 6:21:03 PM By: Levan Hurst RN, BSN Entered By: Levan Hurst on 08/19/2021 15:46:22 -------------------------------------------------------------------------------- Silvana Details Patient Name: Date of Service: [name], [name] 08/19/2021 2:45 PM Medical Record Number: [id number] Patient Account Number: [id number] Date of Birth/Sex: Treating RN: 1961-03-10 (60 y.o. [name] Primary Care Rayvn Rickerson: Charlott Rakes Other Clinician: Referring Orlena Garmon: Treating Theoren Palka/Extender: Sindy Guadeloupe Weeks in Treatment: 25 Vital Signs Time Taken: 15:36 Temperature (F): 98 Height (in): 64 Pulse (bpm): 92 Source: Stated Respiratory Rate (breaths/min): 20 Weight (lbs): 222 Blood Pressure (mmHg): 151/75 Source: Stated Capillary Blood Glucose (mg/dl): 241 Body Mass Index (BMI): 38.1 Reference Range: 80 - 120 mg / dl Notes glucose per pt report this am Electronic Signature(s) Signed: 08/19/2021 6:17:04 PM By: Baruch Gouty RN, BSN Entered By: Baruch Gouty on 08/19/2021 15:37:21

## 2021-08-19 NOTE — Progress Notes (Signed)
FIORA, WEILL ([id number]) Visit Report for 08/19/2021 Debridement Details Patient Name: Date of Service: [name] 08/19/2021 2:45 PM Medical Record Number: [id number] Patient Account Number: [id number] Date of Birth/Sex: Treating RN: [birth year] (60 y.o. [name] Primary Care Provider: Charlott Rakes Other Clinician: Referring Provider: Treating Provider/Extender: Sindy Guadeloupe Weeks in Treatment: 25 Debridement Performed for Assessment: Wound #16 Right,Plantar Amputation Site - Transmetatarsal Performed By: Physician Ricard Dillon., MD Debridement Type: Debridement Severity of Tissue Pre Debridement: Bone involvement without necrosis Level of Consciousness (Pre-procedure): Awake and Alert Pre-procedure Verification/Time Out Yes - 16:10 Taken: Start Time: 16:10 T Area Debrided (L x W): otal 0.5 (cm) x 0.5 (cm) = 0.25 (cm) Tissue and other material debrided: Non-Viable, Bone Level: Skin/Subcutaneous Tissue/Muscle/Bone Debridement Description: Excisional Instrument: Curette Bleeding: Minimum Hemostasis Achieved: Pressure End Time: 16:11 Procedural Pain: 0 Post Procedural Pain: 0 Response to Treatment: Procedure was tolerated well Level of Consciousness (Post- Awake and Alert procedure): Post Debridement Measurements of Total Wound Length: (cm) 0.7 Width: (cm) 1.3 Depth: (cm) 0.6 Volume: (cm) 0.429 Character of Wound/Ulcer Post Debridement: Stable Severity of Tissue Post Debridement: Bone involvement without necrosis Post Procedure Diagnosis Same as Pre-procedure Electronic Signature(s) Signed: 08/19/2021 5:45:05 PM By: Linton Ham MD Signed: 08/19/2021 6:21:03 PM By: Levan Hurst RN, BSN Entered By: Levan Hurst on 08/19/2021 16:15:25 -------------------------------------------------------------------------------- HPI Details Patient Name: Date of Service: [name], [name] 08/19/2021 2:45 PM Medical Record Number:  [id number] Patient Account Number: [id number] Date of Birth/Sex: Treating RN: 1961/03/21 (60 y.o. [name] Primary Care Provider: Charlott Rakes Other Clinician: Referring Provider: Treating Provider/Extender: Sindy Guadeloupe Weeks in Treatment: 25 History of Present Illness HPI Description: This 60 year old patient who has a very long significant history of diabetes mellitus, previous alcohol and nicotine abuse, chronic data disease, COPD, diabetes mellitus, height hypertension, critical lower limb ischemia with several wounds being managed at the wound Center at [facility] for over a year. She was recently in the ER at [facility] and was referred to our center. He has had a long history of critical limb ischemia and over a period of time has had balloon angioplasties in March 2016, endarterectomy of the left carotid, lower extremity angiogram and treatment by Dr. Quay Burow, several cardiac catheterizations. Most recently she had an x-ray of her left foot while in the ER which showed no acute abnormality. during this ER visit she was started on ciprofloxacin and asked to continue with the wound care physicians at [name]. Her last ABI done in June 2017 showed the right side to be 0.28 in the left side to be 0.48. Her right toe brachial index was 0.17 on the right and 0.27 on the left. her last hemoglobin A1c was 11.1% She continues to smoke about a pack of cigarettes a day. 03/28/2017 -- -- right foot x-ray -- IMPRESSION:Areas of soft tissue swelling. Mild subluxation second PIP joint. No frank dislocation or fracture. No erosive change or bony destruction. No soft tissue ulceration or radiopaque foreign body evident by radiography. There is plantar fascia calcification with a nearby inferior calcaneal spur. There are foci of arterial vascular calcification. 04/04/2017 -- the patient continues to be noncompliant and continues to complain of a lot of  pain and has not done anything about quitting smoking Readmission: 06/26/18 on evaluation today patient presents for reevaluation she has not been seen in our office since May 2018. Since she was last seen here in our  office she has undergone testing at Dr. Elspeth Cho office where it was revealed that she had an ABI of 0.68 with her previous ABI being 0.64 and a left ABI of 0.38 with previous ABI being 0.34 this study which was performed on 04/05/18 was pretty much equivalent to the study performed on 11/22/17. The findings in the end revealed on the right would appear to be moderate right lower extremity arterial disease on the left Dr. Gwenlyn Found states moderate in the report but unfortunately this appears to be much more severe at 0.38 compared to the right. Subsequently the patient was scheduled to have angiography with Dr. Gwenlyn Found on 04/12/18. This was however canceled due to the fact that the patient was found to have chronic kidney disease stage III and it was to the point that he did not feel that it will be safe to pursue angiography at that point. She has not been on any antibiotics recently. At this point Dr. Gwenlyn Found has not rescheduled anything as far as the injured [place] according to the patient he is somewhat reluctant to do so. Nonetheless with her diminished blood flow this is gonna make it somewhat difficult for her to heal. 07/05/18; this is a patient I have not seen previously. She has very significant PAD as noted above. She apparently has had revascularization efforts by Dr. Gwenlyn Found on the right on 3 occasions to the patient. She was supposed to have an attempted angiography on the left however this was canceled apparently because of stage III chronic renal failure. I will need to research all of this. She complains of significant pain in the wound and has claudication enough that when she walks to the end of the driveway she has to stop. She is been using silver alginate to the wounds on her  legs and Iodoflex to the area on her left second toe. 07/13/18 on evaluation today patient's wounds actually appear to be doing about the same. She has an appointment she tells me within the next month that is September 2019 with Dr. Gwenlyn Found to discuss options to see if there's anything else that you can recommend or do for her. Nonetheless obviously what we're trying to do is do what we can to save her leg and in turn prevent any additional worsening or damage. None in the meantime we been mainly trying to manage her ulcers as best we can. 07/27/18; some improvement in the multiplicity of wounds on her left lower extremity and foot. She's been using silver alginate. I was unable to determine that she actually has an appointment with Dr. Gwenlyn Found. We are checking into this The patient's wound includes Left lateral foot, left plantar heel, her left anterior calf wound looks close to me, left fourth toe is very close to closed and the left medial calf is perhaps the largest open area READMISSION 02/19/2021 This is a now 60 year old woman with type 2 diabetes and continued cigarette smoking. She has known PAD. She was last in this clinic in 2019 at that point with wounds on her left foot. She left in a nonhealed state. On 06/28/2019 I see she had a left femoropopliteal by vein and vascular with an amputation of the fifth ray. These managed to heal over. Her last arterial studies were in February 2021. This showed an absent waveform at the posterior tibial artery on the right a dampened monophasic dorsalis pedis on the right of 0.44. On the left again the PTA TA was absent her dorsalis pedis was  0.99 triphasic and her great toe pressure was 0.65. This would have been after her revascularization. Once again she tells me that Dr. Gwenlyn Found has done revascularization on the right leg on 3 different occasions. She has a right transmetatarsal amputation apparently done by Dr. Sharol Given remotely but I do not see a note for  these right lower extremity revascularization but I may not of looked back far enough. In any case she says that the she has a wound on the plantar aspect of the right transmetatarsal amputation site there is been there for several months. More recently she fell and had a wound on her right medial lower leg she had 5 sutures placed in the ER and then subsequently she has developed an area on the medial lower leg which was a blister that opened up. Past medical history includes type 2 diabetes, PAD, chronic renal failure, congestive heart failure, right transmetatarsal amputation, left fifth ray amputation, continued tobacco abuse, atrial fibrillation and cirrhosis. We did not attempt an ABI on the right leg today because of pain 02/26/2021; patient we admitted to the clinic last week. She has what looks to be 2 areas on her right medial and right anterior lower leg that look more like venous wounds but she has 1 on the first met head at the base of her right transmet. She has known severe PAD. She complains of a lot of pain although some of this may be neuropathic. Is difficult to exclude a component of claudication. Use silver alginate on the wounds on her legs and Iodoflex on the area on the first met head. She has an appointment with Dr. Gwenlyn Found on 5/25 although I will text him and discussed the situation. She is have poorly controlled diabetic. She has has stage IIIb chronic renal failure 4/22; patient presents for 1 week follow-up. The 2 areas on her right medial and right anterior lower leg appear well-healing. She has been using silver alginate to this area without issues. She has a first met head ulcer and it is unclear how long this has been there as it was discovered in the ED earlier this month when she was being evaluated for another issue. Iodoflex has been used at this area. 4/29; patient presents for 1 week follow-up. She has been using silver alginate to the leg wounds and Iodoflex to  the plantar foot wound. She has had this wrapped with Coban and Kerlix. She has no concerns or complaints today. 5/6; this is a difficult wound on the right plantar foot transmit site. We are not making a lot of progress. She had 2 more venous looking wounds on the right medial and right anterior lower leg 1 of which is healed. Her appointment with Dr. Gwenlyn Found is on 5/25 5/16; she did not tolerate the offloading shoe we gave her in fact she had a fall with an abrasion on her right forearm she is now back in the modified small shoe which does not offload her foot properly. Her appoint with Dr. Gwenlyn Found is still on 5/25 we have been using Iodoflex. The area on the right anterior lower leg is healed 7/18; patient presents for follow-up however has not been seen in 2 months. She was last seen at the end of May. She had a fall at the end of June and was hospitalized. She has been unable to follow-up since then. For the wound she has only been keeping it covered with gauze. She is scheduled for an aortogram with lower extremity  runoff at the end of July. 7/29; patient presents for 2-week follow-up. She has home health and they have been changing her dressings. She denies any issues and has no complaints today. She states she had a procedure where they opened up one of her vessels in her legs. She denies signs of infection. 8/5; patient presents for follow-up. She was recently in the hospital for bradycardia. She was noted to have cellulitis to the left lower extremity and Unna boot was placed due to blisters and increased swelling. She reports improvement to her left leg since being in the hospital and stability check her right plantar foot wound. She denies signs of infection. 8/23; since I last saw this patient a lot has happened. She still has the area over the right first met head in the setting of previous transmetatarsal amputation. Firstly most importantly she underwent revascularization of her occluded  right SFA. She underwent directional atherectomy followed by drug-coated balloon angioplasty. She has no named vessel below the knee hopefully the revascularization of the right SFA will improve collateral flow. It is not felt however that she has any endovascular options. She had follow-up arterial studies noninvasive on 8/9. These showed an ABI of 0.44 at the right PTA. Monophasic waveforms. On the left her great toe ABI was 0.68. Her follow-up arterial Doppler showed a 50 to 74% stenosis in the proximal SFA and a 50 to 74% stenosis in the distal SFA mild to severe atherosclerosis noted throughout the extremity. Areas of shadowing plaque seen, unable to rule out higher grade stenosis she also had a fall apparently wearing a right foot forefoot off loader that we gave her. She therefore comes in in an ordinary running shoe today. Not certain if this is the fall that ended up in the hospital with bradycardia 9/6 area over the right first metatarsal head in the setting of her previous amputation and severe PAD. She is also a diabetic with known PAD status post attempt at revascularization. She is continued smoker Again the separation of visits in the clinic is somewhat disturbing if we are going to consider her for a total contact cast. She is not wearing anything to offload this stating it causes imbalance especially the forefoot off loader. She basically comes in in bedroom slippers She also had a fall this morning about an hour ago. She has a skin tear on the left dorsal forearm 9/13; areas over the right first metatarsal head in the setting of previous TMA and severe PAD. Again she comes in here in slippers. We have been using Hydrofera Blue. We use MolecuLight on this which was essentially negative study 9/20; right first metatarsal head in the setting of her previous TMA and severe PAD. Same slipper type shoes. She says she cannot wear a forefoot off loader and for some reason she will not wear  surgical shoes. She says she is smoking half a pack per day. 9/30; right first metatarsal head. Absolutely no improvement in fact there is tunneling superiorly very close to bone. She does not offload this properly at all. We use MolecuLight on this 2 weeks ago that did not show any surface bacteria we have been using silver collagen is a dressing She tells me she is down to 3 cigarettes a day I have offered encouragement and a treatment plan 10/6; right first metatarsal head. Exposed bone this week which is not surprising. We have been using silver collagen. She is smoking 5 to 10 cigarettes a day Electronic Signature(s)  Signed: 08/19/2021 5:45:05 PM By: Linton Ham MD Entered By: Linton Ham on 08/19/2021 16:15:42 -------------------------------------------------------------------------------- Physical Exam Details Patient Name: Date of Service: [name], [name] 08/19/2021 2:45 PM Medical Record Number: [id number] Patient Account Number: [id number] Date of Birth/Sex: Treating RN: 08/18/1961 (60 y.o. [name] Primary Care Provider: Charlott Rakes Other Clinician: Referring Provider: Treating Provider/Extender: Sindy Guadeloupe Weeks in Treatment: 25 Constitutional Patient is hypertensive.. Pulse regular and within target range for patient.[name] Respirations regular, non-labored and within target range.. Temperature is normal and within the target range for the patient.[name] Appears in no distress. Cardiovascular No palpable pedal pulses. Notes Wound exam; right first metatarsal head. Previous TMA exposed bone known severe PAD. I used a #5 curette removed a piece of the bone for culture. What granulation tissue she has looks healthy. Electronic Signature(s) Signed: 08/19/2021 5:45:05 PM By: Linton Ham MD Entered By: Linton Ham on 08/19/2021 16:17:15 -------------------------------------------------------------------------------- Physician Orders  Details Patient Name: Date of Service: [name], [name] 08/19/2021 2:45 PM Medical Record Number: [id number] Patient Account Number: [id number] Date of Birth/Sex: Treating RN: Jul 07, 1961 (60 y.o. [name] Primary Care Provider: Charlott Rakes Other Clinician: Referring Provider: Treating Provider/Extender: Sindy Guadeloupe Weeks in Treatment: 25 Verbal / Phone Orders: No Diagnosis Coding ICD-10 Coding Code Description N82.956O Unspecified open wound, right foot, subsequent encounter 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 2 diabetes mellitus with foot ulcer E11.51 Type 2 diabetes mellitus with diabetic peripheral angiopathy without gangrene I87.2 Venous insufficiency (chronic) (peripheral) Follow-up Appointments ppointment in 1 week. - with Dr. Dellia Nims Return A Bathing/ Shower/ Hygiene May shower with protection but do not get wound dressing(s) wet. Edema Control - Lymphedema / SCD / Other Elevate legs to the level of the heart or above for 30 minutes daily and/or when sitting, a frequency of: - throughout the day Avoid standing for long periods of time. Patient to wear own compression stockings every day. - left leg Exercise regularly Off-Loading Open toe surgical shoe to: - with felt padding to offload Additional Orders / Instructions Stop/Decrease Smoking Follow Nutritious Diet Wound Treatment Wound #16 - Amputation Site - Transmetatarsal Wound Laterality: Plantar, Right Cleanser: Soap and Water 1 x Per Week/7 Days Discharge Instructions: May shower and wash wound with dial antibacterial soap and water prior to dressing change. Cleanser: Wound Cleanser 1 x Per Week/7 Days Discharge Instructions: Cleanse the wound with wound cleanser or normal saline prior to applying a clean dressing using gauze sponges, not tissue or cotton balls. Peri-Wound Care: Sween Lotion (Moisturizing lotion) (Home Health) 1 x Per Week/7 Days Discharge Instructions: Apply moisturizing lotion as  directed Prim Dressing: Promogran Prisma Matrix, 4.34 (sq in) (silver collagen) 1 x Per Week/7 Days ary Discharge Instructions: Moisten collagen with saline or hydrogel Secondary Dressing: Woven Gauze Sponge, Non-Sterile 4x4 in 1 x Per Week/7 Days Discharge Instructions: Apply over primary dressing as directed. Secondary Dressing: Optifoam Non-Adhesive Dressing, 4x4 in 1 x Per Week/7 Days Discharge Instructions: Foam donut to help offload Compression Wrap: Kerlix Roll 4.5x3.1 (in/yd) 1 x Per Week/7 Days Discharge Instructions: Apply Kerlix and Coban compression as directed. Compression Wrap: Coban Self-Adherent Wrap 4x5 (in/yd) 1 x Per Week/7 Days Discharge Instructions: Apply over Kerlix as directed. Laboratory naerobe culture (MICRO) - Bone - right transmet - (ICD10 E11.621 - Type 2 diabetes mellitus Bacteria identified in Unspecified specimen by A with foot ulcer) LOINC Code: 130-8 Convenience Name: Anerobic culture Electronic Signature(s) Signed: 08/19/2021 5:45:05 PM By: Linton Ham MD Signed: 08/19/2021  6:21:03 PM By: Levan Hurst RN, BSN Entered By: Levan Hurst on 08/19/2021 16:14:22 -------------------------------------------------------------------------------- Problem List Details Patient Name: Date of Service: [name], [name] 08/19/2021 2:45 PM Medical Record Number: [id number] Patient Account Number: [id number] Date of Birth/Sex: Treating RN: 1961/02/23 (60 y.o. [name] Primary Care Provider: Charlott Rakes Other Clinician: Referring Provider: Treating Provider/Extender: Sindy Guadeloupe Weeks in Treatment: 25 Active Problems ICD-10 Encounter Code Description Active Date MDM Diagnosis S91.301D Unspecified open wound, right foot, subsequent encounter 05/31/2021 No Yes 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 2 diabetes mellitus with foot ulcer 02/19/2021 No Yes E11.51 Type 2 diabetes mellitus with diabetic peripheral angiopathy without gangrene 02/19/2021 No  Yes I87.2 Venous insufficiency (chronic) (peripheral) 06/18/2021 No Yes Inactive Problems ICD-10 Code Description Active Date Inactive Date L97.811 Non-pressure chronic ulcer of other part of right lower leg limited to breakdown of skin 02/19/2021 02/19/2021 S81.802A Unspecified open wound, left lower leg, initial encounter 06/18/2021 06/18/2021 S40.812D Abrasion of left upper arm, subsequent encounter 07/20/2021 07/20/2021 Resolved Problems ICD-10 Code Description Active Date Resolved Date S40.811D Abrasion of right upper arm, subsequent encounter 03/29/2021 03/29/2021 Electronic Signature(s) Signed: 08/19/2021 5:45:05 PM By: Linton Ham MD Entered By: Linton Ham on 08/19/2021 16:14:59 -------------------------------------------------------------------------------- Progress Note Details Patient Name: Date of Service: [name], [name] 08/19/2021 2:45 PM Medical Record Number: [id number] Patient Account Number: [id number] Date of Birth/Sex: Treating RN: [birth year] (60 y.o. [name] Primary Care Provider: Charlott Rakes Other Clinician: Referring Provider: Treating Provider/Extender: Sindy Guadeloupe Weeks in Treatment: 25 Subjective History of Present Illness (HPI) This 60 year old patient who has a very long significant history of diabetes mellitus, previous alcohol and nicotine abuse, chronic data disease, COPD, diabetes mellitus, height hypertension, critical lower limb ischemia with several wounds being managed at the wound Center at [facility] for over a year. She was recently in the ER at [facility] and was referred to our center. He has had a long history of critical limb ischemia and over a period of time has had balloon angioplasties in March 2016, endarterectomy of the left carotid, lower extremity angiogram and treatment by Dr. Quay Burow, several cardiac catheterizations. Most recently she had an x-ray of her left foot while in the ER which  showed no acute abnormality. during this ER visit she was started on ciprofloxacin and asked to continue with the wound care physicians at [facility]. Her last ABI done in June 2017 showed the right side to be 0.28 in the left side to be 0.48. Her right toe brachial index was 0.17 on the right and 0.27 on the left. her last hemoglobin A1c was 11.1% She continues to smoke about a pack of cigarettes a day. 03/28/2017 -- -- right foot x-ray -- IMPRESSION:Areas of soft tissue swelling. Mild subluxation second PIP joint. No frank dislocation or fracture. No erosive change or bony destruction. No soft tissue ulceration or radiopaque foreign body evident by radiography. There is plantar fascia calcification with a nearby inferior calcaneal spur. There are foci of arterial vascular calcification. 04/04/2017 -- the patient continues to be noncompliant and continues to complain of a lot of pain and has not done anything about quitting smoking Readmission: 06/26/18 on evaluation today patient presents for reevaluation she has not been seen in our office since May 2018. Since she was last seen here in our office she has undergone testing at Dr. Fransico Ernesto Zukowski office where it was revealed that she had an ABI of 0.68 with her previous ABI  being 0.64 and a left ABI of 0.38 with previous ABI being 0.34 this study which was performed on 04/05/18 was pretty much equivalent to the study performed on 11/22/17. The findings in the end revealed on the right would appear to be moderate right lower extremity arterial disease on the left Dr. Gwenlyn Found states moderate in the report but unfortunately this appears to be much more severe at 0.38 compared to the right. Subsequently the patient was scheduled to have angiography with Dr. Gwenlyn Found on 04/12/18. This was however canceled due to the fact that the patient was found to have chronic kidney disease stage III and it was to the point that he did not feel that it will be safe to  pursue angiography at that point. She has not been on any antibiotics recently. At this point Dr. Gwenlyn Found has not rescheduled anything as far as the injured [place] according to the patient he is somewhat reluctant to do so. Nonetheless with her diminished blood flow this is gonna make it somewhat difficult for her to heal. 07/05/18; this is a patient I have not seen previously. She has very significant PAD as noted above. She apparently has had revascularization efforts by Dr. Gwenlyn Found on the right on 3 occasions to the patient. She was supposed to have an attempted angiography on the left however this was canceled apparently because of stage III chronic renal failure. I will need to research all of this. She complains of significant pain in the wound and has claudication enough that when she walks to the end of the driveway she has to stop. She is been using silver alginate to the wounds on her legs and Iodoflex to the area on her left second toe. 07/13/18 on evaluation today patient's wounds actually appear to be doing about the same. She has an appointment she tells me within the next month that is September 2019 with Dr. Gwenlyn Found to discuss options to see if there's anything else that you can recommend or do for her. Nonetheless obviously what we're trying to do is do what we can to save her leg and in turn prevent any additional worsening or damage. None in the meantime we been mainly trying to manage her ulcers as best we can. 07/27/18; some improvement in the multiplicity of wounds on her left lower extremity and foot. She's been using silver alginate. I was unable to determine that she actually has an appointment with Dr. Gwenlyn Found. We are checking into this The patient's wound includes Left lateral foot, left plantar heel, her left anterior calf wound looks close to me, left fourth toe is very close to closed and the left medial calf is perhaps the largest open area READMISSION 02/19/2021 This is a now  60 year old woman with type 2 diabetes and continued cigarette smoking. She has known PAD. She was last in this clinic in 2019 at that point with wounds on her left foot. She left in a nonhealed state. On 06/28/2019 I see she had a left femoropopliteal by vein and vascular with an amputation of the fifth ray. These managed to heal over. Her last arterial studies were in February 2021. This showed an absent waveform at the posterior tibial artery on the right a dampened monophasic dorsalis pedis on the right of 0.44. On the left again the PTA TA was absent her dorsalis pedis was 0.99 triphasic and her great toe pressure was 0.65. This would have been after her revascularization. Once again she tells me that Dr. Gwenlyn Found  has done revascularization on the right leg on 3 different occasions. She has a right transmetatarsal amputation apparently done by Dr. Sharol Given remotely but I do not see a note for these right lower extremity revascularization but I may not of looked back far enough. In any case she says that the she has a wound on the plantar aspect of the right transmetatarsal amputation site there is been there for several months. More recently she fell and had a wound on her right medial lower leg she had 5 sutures placed in the ER and then subsequently she has developed an area on the medial lower leg which was a blister that opened up. Past medical history includes type 2 diabetes, PAD, chronic renal failure, congestive heart failure, right transmetatarsal amputation, left fifth ray amputation, continued tobacco abuse, atrial fibrillation and cirrhosis. We did not attempt an ABI on the right leg today because of pain 02/26/2021; patient we admitted to the clinic last week. She has what looks to be 2 areas on her right medial and right anterior lower leg that look more like venous wounds but she has 1 on the first met head at the base of her right transmet. She has known severe PAD. She complains of a lot  of pain although some of this may be neuropathic. Is difficult to exclude a component of claudication. Use silver alginate on the wounds on her legs and Iodoflex on the area on the first met head. She has an appointment with Dr. Gwenlyn Found on 5/25 although I will text him and discussed the situation. She is have poorly controlled diabetic. She has has stage IIIb chronic renal failure 4/22; patient presents for 1 week follow-up. The 2 areas on her right medial and right anterior lower leg appear well-healing. She has been using silver alginate to this area without issues. She has a first met head ulcer and it is unclear how long this has been there as it was discovered in the ED earlier this month when she was being evaluated for another issue. Iodoflex has been used at this area. 4/29; patient presents for 1 week follow-up. She has been using silver alginate to the leg wounds and Iodoflex to the plantar foot wound. She has had this wrapped with Coban and Kerlix. She has no concerns or complaints today. 5/6; this is a difficult wound on the right plantar foot transmit site. We are not making a lot of progress. She had 2 more venous looking wounds on the right medial and right anterior lower leg 1 of which is healed. Her appointment with Dr. Gwenlyn Found is on 5/25 5/16; she did not tolerate the offloading shoe we gave her in fact she had a fall with an abrasion on her right forearm she is now back in the modified small shoe which does not offload her foot properly. Her appoint with Dr. Gwenlyn Found is still on 5/25 we have been using Iodoflex. The area on the right anterior lower leg is healed 7/18; patient presents for follow-up however has not been seen in 2 months. She was last seen at the end of May. She had a fall at the end of June and was hospitalized. She has been unable to follow-up since then. For the wound she has only been keeping it covered with gauze. She is scheduled for an aortogram with lower extremity  runoff at the end of July. 7/29; patient presents for 2-week follow-up. She has home health and they have been changing her dressings. She  denies any issues and has no complaints today. She states she had a procedure where they opened up one of her vessels in her legs. She denies signs of infection. 8/5; patient presents for follow-up. She was recently in the hospital for bradycardia. She was noted to have cellulitis to the left lower extremity and Unna boot was placed due to blisters and increased swelling. She reports improvement to her left leg since being in the hospital and stability check her right plantar foot wound. She denies signs of infection. 8/23; since I last saw this patient a lot has happened. She still has the area over the right first met head in the setting of previous transmetatarsal amputation. Firstly most importantly she underwent revascularization of her occluded right SFA. She underwent directional atherectomy followed by drug-coated balloon angioplasty. She has no named vessel below the knee hopefully the revascularization of the right SFA will improve collateral flow. It is not felt however that she has any endovascular options. She had follow-up arterial studies noninvasive on 8/9. These showed an ABI of 0.44 at the right PTA. Monophasic waveforms. On the left her great toe ABI was 0.68. Her follow-up arterial Doppler showed a 50 to 74% stenosis in the proximal SFA and a 50 to 74% stenosis in the distal SFA mild to severe atherosclerosis noted throughout the extremity. Areas of shadowing plaque seen, unable to rule out higher grade stenosis she also had a fall apparently wearing a right foot forefoot off loader that we gave her. She therefore comes in in an ordinary running shoe today. Not certain if this is the fall that ended up in the hospital with bradycardia 9/6 area over the right first metatarsal head in the setting of her previous amputation and severe PAD. She is  also a diabetic with known PAD status post attempt at revascularization. She is continued smoker Again the separation of visits in the clinic is somewhat disturbing if we are going to consider her for a total contact cast. She is not wearing anything to offload this stating it causes imbalance especially the forefoot off loader. She basically comes in in bedroom slippers She also had a fall this morning about an hour ago. She has a skin tear on the left dorsal forearm 9/13; areas over the right first metatarsal head in the setting of previous TMA and severe PAD. Again she comes in here in slippers. We have been using Hydrofera Blue. We use MolecuLight on this which was essentially negative study 9/20; right first metatarsal head in the setting of her previous TMA and severe PAD. Same slipper type shoes. She says she cannot wear a forefoot off loader and for some reason she will not wear surgical shoes. She says she is smoking half a pack per day. 9/30; right first metatarsal head. Absolutely no improvement in fact there is tunneling superiorly very close to bone. She does not offload this properly at all. We use MolecuLight on this 2 weeks ago that did not show any surface bacteria we have been using silver collagen is a dressing She tells me she is down to 3 cigarettes a day I have offered encouragement and a treatment plan 10/6; right first metatarsal head. Exposed bone this week which is not surprising. We have been using silver collagen. She is smoking 5 to 10 cigarettes a day Objective Constitutional Patient is hypertensive.. Pulse regular and within target range for patient.[name] Respirations regular, non-labored and within target range.. Temperature is normal and within the target  range for the patient.[name] Appears in no distress. Vitals Time Taken: 3:36 PM, Height: 64 in, Source: Stated, Weight: 222 lbs, Source: Stated, BMI: 38.1, Temperature: 98 F, Pulse: 92 bpm, Respiratory Rate: 20  breaths/min, Blood Pressure: 151/75 mmHg, Capillary Blood Glucose: 241 mg/dl. General Notes: glucose per pt report this am Cardiovascular No palpable pedal pulses. General Notes: Wound exam; right first metatarsal head. Previous TMA exposed bone known severe PAD. I used a #5 curette removed a piece of the bone for culture. What granulation tissue she has looks healthy. Integumentary (Hair, Skin) Wound #16 status is Open. Original cause of wound was Gradually Appeared. The date acquired was: 02/05/2021. The wound has been in treatment 25 weeks. The wound is located on the Right,Plantar Amputation Site - Transmetatarsal. The wound measures 0.7cm length x 1.3cm width x 0.6cm depth; 0.715cm^2 area and 0.429cm^3 volume. There is bone and Fat Layer (Subcutaneous Tissue) exposed. There is undermining starting at 5:00 and ending at 12:00 with a maximum distance of 1.1cm. There is a medium amount of serosanguineous drainage noted. The wound margin is thickened. There is large (67-100%) red, pink granulation within the wound bed. There is a small (1-33%) amount of necrotic tissue within the wound bed including Adherent Slough. Assessment Active Problems ICD-10 Unspecified open wound, right foot, subsequent encounter Type 2 diabetes mellitus with foot ulcer Type 2 diabetes mellitus with diabetic peripheral angiopathy without gangrene Venous insufficiency (chronic) (peripheral) Procedures Wound #16 Pre-procedure diagnosis of Wound #16 is a Diabetic Wound/Ulcer of the Lower Extremity located on the Right,Plantar Amputation Site - Transmetatarsal .Severity of Tissue Pre Debridement is: Bone involvement without necrosis. There was a Excisional Skin/Subcutaneous Tissue/Muscle/Bone Debridement with a total area of 0.25 sq cm performed by Ricard Dillon., MD. With the following instrument(s): Curette to remove Non-Viable tissue/material. Material removed includes Bone. No specimens were taken. A time out was  conducted at 16:10, prior to the start of the procedure. A Minimum amount of bleeding was controlled with Pressure. The procedure was tolerated well with a pain level of 0 throughout and a pain level of 0 following the procedure. Post Debridement Measurements: 0.7cm length x 1.3cm width x 0.6cm depth; 0.429cm^3 volume. Character of Wound/Ulcer Post Debridement is stable. Severity of Tissue Post Debridement is: Bone involvement without necrosis. Post procedure Diagnosis Wound #16: Same as Pre-Procedure Plan Follow-up Appointments: Return Appointment in 1 week. - with Dr. Arcola Jansky Shower/ Hygiene: May shower with protection but do not get wound dressing(s) wet. Edema Control - Lymphedema / SCD / Other: Elevate legs to the level of the heart or above for 30 minutes daily and/or when sitting, a frequency of: - throughout the day Avoid standing for long periods of time. Patient to wear own compression stockings every day. - left leg Exercise regularly Off-Loading: Open toe surgical shoe to: - with felt padding to offload Additional Orders / Instructions: Stop/Decrease Smoking Follow Nutritious Diet Laboratory ordered were: Anerobic culture - Bone - right transmet WOUND #16: - Amputation Site - Transmetatarsal Wound Laterality: Plantar, Right Cleanser: Soap and Water 1 x Per Week/7 Days Discharge Instructions: May shower and wash wound with dial antibacterial soap and water prior to dressing change. Cleanser: Wound Cleanser 1 x Per Week/7 Days Discharge Instructions: Cleanse the wound with wound cleanser or normal saline prior to applying a clean dressing using gauze sponges, not tissue or cotton balls. Peri-Wound Care: Sween Lotion (Moisturizing lotion) (Home Health) 1 x Per Week/7 Days Discharge Instructions: Apply moisturizing lotion as directed  Prim Dressing: Promogran Prisma Matrix, 4.34 (sq in) (silver collagen) 1 x Per Week/7 Days ary Discharge Instructions: Moisten collagen  with saline or hydrogel Secondary Dressing: Woven Gauze Sponge, Non-Sterile 4x4 in 1 x Per Week/7 Days Discharge Instructions: Apply over primary dressing as directed. Secondary Dressing: Optifoam Non-Adhesive Dressing, 4x4 in 1 x Per Week/7 Days Discharge Instructions: Foam donut to help offload Com pression Wrap: Kerlix Roll 4.5x3.1 (in/yd) 1 x Per Week/7 Days Discharge Instructions: Apply Kerlix and Coban compression as directed. Com pression Wrap: Coban Self-Adherent Wrap 4x5 (in/yd) 1 x Per Week/7 Days Discharge Instructions: Apply over Kerlix as directed. 1. I continued the silver collagen 2. Bone shaving for CNS. 3. If the culture is negative I will consider her for a total contact cast next week 4. She had revascularization but had severe PAD in the tibial vessels I do not believe she is a candidate for any further revascularization although I will try to review these notes Electronic Signature(s) Signed: 08/19/2021 5:45:05 PM By: Linton Ham MD Entered By: Linton Ham on 08/19/2021 16:18:06 -------------------------------------------------------------------------------- SuperBill Details Patient Name: Date of Service: [name], [name] 08/19/2021 Medical Record Number: [id number] Patient Account Number: [id number] Date of Birth/Sex: Treating RN: 1961/02/04 (60 y.o. [name], Meta.Reding Primary Care Provider: Charlott Rakes Other Clinician: Referring Provider: Treating Provider/Extender: Sindy Guadeloupe Weeks in Treatment: 25 Diagnosis Coding ICD-10 Codes Code Description B44.967R Unspecified open wound, right foot, subsequent encounter 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 2 diabetes mellitus with foot ulcer E11.51 Type 2 diabetes mellitus with diabetic peripheral angiopathy without gangrene I87.2 Venous insufficiency (chronic) (peripheral) Facility Procedures CPT4 Code: [id number] Description: 59935 - DEB BONE 20 SQ CM/< ICD-10 Diagnosis Description S91.301D Unspecified open  wound, right foot, subsequent encounter 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 2 diabetes mellitus with foot ulcer Modifier: Quantity: 1 Physician Procedures CPT4: Description Modifier Code [id number] Debridement; bone (includes epidermis, dermis, subQ tissue, muscle and/or fascia, if performed) 1st 20 sqcm or less ICD-10 Diagnosis Description S91.301D Unspecified open wound, right foot, subsequent encounter  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 2 diabetes mellitus with foot ulcer Quantity: 1 Electronic Signature(s) Signed: 08/19/2021 5:45:05 PM By: Linton Ham MD Entered By: Linton Ham on 08/19/2021 16:18:22

## 2021-08-19 NOTE — Telephone Encounter (Signed)
Pls write on script and fax per [facility].

## 2021-08-24 ENCOUNTER — Telehealth (HOSPITAL_COMMUNITY): Payer: Self-pay | Admitting: Adult Health

## 2021-08-24 NOTE — Telephone Encounter (Signed)
Called to remind to send a reading.   She is waiting on a new pillow to be shipped to hers house  for [facility] Reading.   Gunhild Bautch NP-C  4:01 PM

## 2021-08-24 NOTE — Telephone Encounter (Signed)
Paperwork has already been faxed over to [facility]

## 2021-08-25 ENCOUNTER — Other Ambulatory Visit: Payer: Self-pay

## 2021-08-25 ENCOUNTER — Ambulatory Visit (HOSPITAL_COMMUNITY)
Admission: RE | Admit: 2021-08-25 | Discharge: 2021-08-25 | Disposition: A | Discharge status: Home / Self Care | Payer: Medicare Other | Source: Ambulatory Visit | Attending: Internal Medicine | Admitting: Internal Medicine

## 2021-08-25 DIAGNOSIS — I5032 Chronic diastolic (congestive) heart failure: Secondary | ICD-10-CM | POA: Insufficient documentation

## 2021-08-25 LAB — BASIC METABOLIC PANEL
Anion gap: 13 (ref 5–15)
BUN: 22 mg/dL — ABNORMAL HIGH (ref 6–20)
CO2: 27 mmol/L (ref 22–32)
Calcium: 8.8 mg/dL — ABNORMAL LOW (ref 8.9–10.3)
Chloride: 96 mmol/L — ABNORMAL LOW (ref 98–111)
Creatinine, Ser: 1.56 mg/dL — ABNORMAL HIGH (ref 0.44–1.00)
GFR, Estimated: 38 mL/min — ABNORMAL LOW (ref >60)
Glucose, Bld: 278 mg/dL — ABNORMAL HIGH (ref 70–99)
Potassium: 4 mmol/L (ref 3.5–5.1)
Sodium: 136 mmol/L (ref 135–145)

## 2021-08-25 LAB — AEROBIC/ANAEROBIC CULTURE W GRAM STAIN (SURGICAL/DEEP WOUND)

## 2021-08-26 ENCOUNTER — Encounter (HOSPITAL_BASED_OUTPATIENT_CLINIC_OR_DEPARTMENT_OTHER): Payer: Medicare Other | Admitting: Internal Medicine

## 2021-08-30 ENCOUNTER — Other Ambulatory Visit: Payer: Self-pay

## 2021-08-30 ENCOUNTER — Encounter (HOSPITAL_BASED_OUTPATIENT_CLINIC_OR_DEPARTMENT_OTHER): Payer: Medicare Other | Admitting: Internal Medicine

## 2021-08-30 ENCOUNTER — Telehealth (HOSPITAL_COMMUNITY): Payer: Self-pay

## 2021-08-30 DIAGNOSIS — S91301D Unspecified open wound, right foot, subsequent encounter: Secondary | ICD-10-CM | POA: Diagnosis not present

## 2021-08-30 NOTE — Telephone Encounter (Signed)
Contacted pharmacy to order the following:  Clopidogrel Pantoprazole Torsemide Losartan ('50mg'$ )  Marylouise Stacks, EMT-Paramedic  08/30/21

## 2021-08-30 NOTE — Progress Notes (Signed)
KIMI, BORDEAU ([id number]) Visit Report for 08/30/2021 HPI Details Patient Name: Date of Service: [name] 08/30/2021 3:15 PM Medical Record Number: [id number] Patient Account Number: [id number] Date of Birth/Sex: Treating RN: 1961/07/25 (60 y.o. [name] Primary Care Provider: Charlott Rakes Other Clinician: Referring Provider: Treating Provider/Extender: Sindy Guadeloupe Weeks in Treatment: 27 History of Present Illness HPI Description: This 60 year old patient who has a very long significant history of diabetes mellitus, previous alcohol and nicotine abuse, chronic data disease, COPD, diabetes mellitus, height hypertension, critical lower limb ischemia with several wounds being managed at the wound Center at [facility] for over a year. She was recently in the ER at [facility] and was referred to our center. He has had a long history of critical limb ischemia and over a period of time has had balloon angioplasties in March 2016, endarterectomy of the left carotid, lower extremity angiogram and treatment by Dr. Quay Burow, several cardiac catheterizations. Most recently she had an x-ray of her left foot while in the ER which showed no acute abnormality. during this ER visit she was started on ciprofloxacin and asked to continue with the wound care physicians at [facility]. Her last ABI done in June 2017 showed the right side to be 0.28 in the left side to be 0.48. Her right toe brachial index was 0.17 on the right and 0.27 on the left. her last hemoglobin A1c was 11.1% She continues to smoke about a pack of cigarettes a day. 03/28/2017 -- -- right foot x-ray -- IMPRESSION:Areas of soft tissue swelling. Mild subluxation second PIP joint. No frank dislocation or fracture. No erosive change or bony destruction. No soft tissue ulceration or radiopaque foreign body evident by radiography. There is plantar fascia calcification with a nearby  inferior calcaneal spur. There are foci of arterial vascular calcification. 04/04/2017 -- the patient continues to be noncompliant and continues to complain of a lot of pain and has not done anything about quitting smoking Readmission: 06/26/18 on evaluation today patient presents for reevaluation she has not been seen in our office since May 2018. Since she was last seen here in our office she has undergone testing at Dr. Elspeth Cho office where it was revealed that she had an ABI of 0.68 with her previous ABI being 0.64 and a left ABI of 0.38 with previous ABI being 0.34 this study which was performed on 04/05/18 was pretty much equivalent to the study performed on 11/22/17. The findings in the end revealed on the right would appear to be moderate right lower extremity arterial disease on the left Dr. Gwenlyn Found states moderate in the report but unfortunately this appears to be much more severe at 0.38 compared to the right. Subsequently the patient was scheduled to have angiography with Dr. Gwenlyn Found on 04/12/18. This was however canceled due to the fact that the patient was found to have chronic kidney disease stage III and it was to the point that he did not feel that it will be safe to pursue angiography at that point. She has not been on any antibiotics recently. At this point Dr. Gwenlyn Found has not rescheduled anything as far as the injured [place] according to the patient he is somewhat reluctant to do so. Nonetheless with her diminished blood flow this is gonna make it somewhat difficult for her to heal. 07/05/18; this is a patient I have not seen previously. She has very significant PAD as noted above. She apparently has had  revascularization efforts by Dr. Gwenlyn Found on the right on 3 occasions to the patient. She was supposed to have an attempted angiography on the left however this was canceled apparently because of stage III chronic renal failure. I will need to research all of this. She complains of  significant pain in the wound and has claudication enough that when she walks to the end of the driveway she has to stop. She is been using silver alginate to the wounds on her legs and Iodoflex to the area on her left second toe. 07/13/18 on evaluation today patient's wounds actually appear to be doing about the same. She has an appointment she tells me within the next month that is September 2019 with Dr. Gwenlyn Found to discuss options to see if there's anything else that you can recommend or do for her. Nonetheless obviously what we're trying to do is do what we can to save her leg and in turn prevent any additional worsening or damage. None in the meantime we been mainly trying to manage her ulcers as best we can. 07/27/18; some improvement in the multiplicity of wounds on her left lower extremity and foot. She's been using silver alginate. I was unable to determine that she actually has an appointment with Dr. Gwenlyn Found. We are checking into this The patient's wound includes Left lateral foot, left plantar heel, her left anterior calf wound looks close to me, left fourth toe is very close to closed and the left medial calf is perhaps the largest open area READMISSION 02/19/2021 This is a now 60 year old woman with type 2 diabetes and continued cigarette smoking. She has known PAD. She was last in this clinic in 2019 at that point with wounds on her left foot. She left in a nonhealed state. On 06/28/2019 I see she had a left femoropopliteal by vein and vascular with an amputation of the fifth ray. These managed to heal over. Her last arterial studies were in February 2021. This showed an absent waveform at the posterior tibial artery on the right a dampened monophasic dorsalis pedis on the right of 0.44. On the left again the PTA TA was absent her dorsalis pedis was 0.99 triphasic and her great toe pressure was 0.65. This would have been after her revascularization. Once again she tells me that Dr. Gwenlyn Found has  done revascularization on the right leg on 3 different occasions. She has a right transmetatarsal amputation apparently done by Dr. Sharol Given remotely but I do not see a note for these right lower extremity revascularization but I may not of looked back far enough. In any case she says that the she has a wound on the plantar aspect of the right transmetatarsal amputation site there is been there for several months. More recently she fell and had a wound on her right medial lower leg she had 5 sutures placed in the ER and then subsequently she has developed an area on the medial lower leg which was a blister that opened up. Past medical history includes type 2 diabetes, PAD, chronic renal failure, congestive heart failure, right transmetatarsal amputation, left fifth ray amputation, continued tobacco abuse, atrial fibrillation and cirrhosis. We did not attempt an ABI on the right leg today because of pain 02/26/2021; patient we admitted to the clinic last week. She has what looks to be 2 areas on her right medial and right anterior lower leg that look more like venous wounds but she has 1 on the first met head at the base of  her right transmet. She has known severe PAD. She complains of a lot of pain although some of this may be neuropathic. Is difficult to exclude a component of claudication. Use silver alginate on the wounds on her legs and Iodoflex on the area on the first met head. She has an appointment with Dr. Gwenlyn Found on 5/25 although I will text him and discussed the situation. She is have poorly controlled diabetic. She has has stage IIIb chronic renal failure 4/22; patient presents for 1 week follow-up. The 2 areas on her right medial and right anterior lower leg appear well-healing. She has been using silver alginate to this area without issues. She has a first met head ulcer and it is unclear how long this has been there as it was discovered in the ED earlier this month when she was being  evaluated for another issue. Iodoflex has been used at this area. 4/29; patient presents for 1 week follow-up. She has been using silver alginate to the leg wounds and Iodoflex to the plantar foot wound. She has had this wrapped with Coban and Kerlix. She has no concerns or complaints today. 5/6; this is a difficult wound on the right plantar foot transmit site. We are not making a lot of progress. She had 2 more venous looking wounds on the right medial and right anterior lower leg 1 of which is healed. Her appointment with Dr. Gwenlyn Found is on 5/25 5/16; she did not tolerate the offloading shoe we gave her in fact she had a fall with an abrasion on her right forearm she is now back in the modified small shoe which does not offload her foot properly. Her appoint with Dr. Gwenlyn Found is still on 5/25 we have been using Iodoflex. The area on the right anterior lower leg is healed 7/18; patient presents for follow-up however has not been seen in 2 months. She was last seen at the end of May. She had a fall at the end of June and was hospitalized. She has been unable to follow-up since then. For the wound she has only been keeping it covered with gauze. She is scheduled for an aortogram with lower extremity runoff at the end of July. 7/29; patient presents for 2-week follow-up. She has home health and they have been changing her dressings. She denies any issues and has no complaints today. She states she had a procedure where they opened up one of her vessels in her legs. She denies signs of infection. 8/5; patient presents for follow-up. She was recently in the hospital for bradycardia. She was noted to have cellulitis to the left lower extremity and Unna boot was placed due to blisters and increased swelling. She reports improvement to her left leg since being in the hospital and stability check her right plantar foot wound. She denies signs of infection. 8/23; since I last saw this patient a lot has happened.  She still has the area over the right first met head in the setting of previous transmetatarsal amputation. Firstly most importantly she underwent revascularization of her occluded right SFA. She underwent directional atherectomy followed by drug-coated balloon angioplasty. She has no named vessel below the knee hopefully the revascularization of the right SFA will improve collateral flow. It is not felt however that she has any endovascular options. She had follow-up arterial studies noninvasive on 8/9. These showed an ABI of 0.44 at the right PTA. Monophasic waveforms. On the left her great toe ABI was 0.68. Her follow-up arterial Doppler  showed a 50 to 74% stenosis in the proximal SFA and a 50 to 74% stenosis in the distal SFA mild to severe atherosclerosis noted throughout the extremity. Areas of shadowing plaque seen, unable to rule out higher grade stenosis she also had a fall apparently wearing a right foot forefoot off loader that we gave her. She therefore comes in in an ordinary running shoe today. Not certain if this is the fall that ended up in the hospital with bradycardia 9/6 area over the right first metatarsal head in the setting of her previous amputation and severe PAD. She is also a diabetic with known PAD status post attempt at revascularization. She is continued smoker Again the separation of visits in the clinic is somewhat disturbing if we are going to consider her for a total contact cast. She is not wearing anything to offload this stating it causes imbalance especially the forefoot off loader. She basically comes in in bedroom slippers She also had a fall this morning about an hour ago. She has a skin tear on the left dorsal forearm 9/13; areas over the right first metatarsal head in the setting of previous TMA and severe PAD. Again she comes in here in slippers. We have been using Hydrofera Blue. We use MolecuLight on this which was essentially negative study 9/20; right  first metatarsal head in the setting of her previous TMA and severe PAD. Same slipper type shoes. She says she cannot wear a forefoot off loader and for some reason she will not wear surgical shoes. She says she is smoking half a pack per day. 9/30; right first metatarsal head. Absolutely no improvement in fact there is tunneling superiorly very close to bone. She does not offload this properly at all. We use MolecuLight on this 2 weeks ago that did not show any surface bacteria we have been using silver collagen is a dressing She tells me she is down to 3 cigarettes a day I have offered encouragement and a treatment plan 10/6; right first metatarsal head. Exposed bone this week which is not surprising. We have been using silver collagen. She is smoking 5 to 10 cigarettes a day 10/17; she has not been here in 10 days. The bone scraping that I did on 10/6 showed staph lugdunensis, staph epidermidis and Pseudomonas Alcalifenes. I put in for Septra DS 1 p.o. twice daily for 14 days last week would be but we could not reach her to start the antibiotic. Quinolones would have been a good alternative except she has multiple drug interactions. Septra would not cover what ever the Pseudomonas is but I am not really sure of the relevance here. I am going to try her on the Septra for 2 weeks. She has a probing wound on the right TMA that probes to bone. She claims to have just about stop smoking I reviewed her arterial status. She underwent a left fem-tib bypass by Dr. Donnetta Hutching in 06/28/2019. Her last angiogram was in July of this year by Dr. Gwenlyn Found. This showed an occluded right SFA the popliteal and tibial vessels were also occluded the peroneal vessel filled by collaterals and filled the PT and DP by communicating collaterals. She was not felt to have any additional and vascular options. Dr. Gwenlyn Found noted that she he had previously revascularized her right SFA 3 times. Electronic Signature(s) Signed: 08/30/2021  5:32:36 PM By: Linton Ham MD Entered By: Linton Ham on 08/30/2021 17:00:18 -------------------------------------------------------------------------------- Physical Exam Details Patient Name: Date of Service: HA LEY, DO  Nadara Mustard 08/30/2021 3:15 PM Medical Record Number: [id number] Patient Account Number: [id number] Date of Birth/Sex: Treating RN: Oct 04, 1961 (60 y.o. [name] Primary Care Provider: Charlott Rakes Other Clinician: Referring Provider: Treating Provider/Extender: Sindy Guadeloupe Weeks in Treatment: 27 Constitutional Sitting or standing Blood Pressure is within target range for patient.. Pulse regular and within target range for patient.[name] Respirations regular, non-labored and within target range.. Temperature is normal and within the target range for the patient.[name] Appears in no distress. Cardiovascular Pedal pulses are not palpable. Notes Wound exam; right first metatarsal head small wound but it probes to bone in the middle. There is no surrounding erythema. Electronic Signature(s) Signed: 08/30/2021 5:32:36 PM By: Linton Ham MD Entered By: Linton Ham on 08/30/2021 17:01:06 -------------------------------------------------------------------------------- Physician Orders Details Patient Name: Date of Service: [name], [name] 08/30/2021 3:15 PM Medical Record Number: [id number] Patient Account Number: [id number] Date of Birth/Sex: Treating RN: [birth year] (60 y.o. [name], Meta.Reding Primary Care Provider: Charlott Rakes Other Clinician: Referring Provider: Treating Provider/Extender: Ramon Dredge in Treatment: 93 Verbal / Phone Orders: No Diagnosis Coding ICD-10 Coding Code Description X43.568S Unspecified open wound, right foot, subsequent encounter 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 2 diabetes mellitus with foot ulcer E11.51 Type 2 diabetes mellitus with diabetic peripheral angiopathy without gangrene I87.2 Venous  insufficiency (chronic) (peripheral) Follow-up Appointments ppointment in 1 week. - with Dr. Dellia Nims Return A ****Go pick up oral antibiotics today and start taking today.**** Bathing/ Shower/ Hygiene May shower with protection but do not get wound dressing(s) wet. Edema Control - Lymphedema / SCD / Other Elevate legs to the level of the heart or above for 30 minutes daily and/or when sitting, a frequency of: - throughout the day Avoid standing for long periods of time. Patient to wear own compression stockings every day. - left leg Exercise regularly Off-Loading Open toe surgical shoe to: - with felt padding to offload Additional Orders / Instructions Stop/Decrease Smoking Follow Nutritious Diet Non Wound Condition Other Non Wound Condition Orders/Instructions: - apply xeroform abd pad and kerlix to right forearm scratches. Wound Treatment Wound #16 - Amputation Site - Transmetatarsal Wound Laterality: Plantar, Right Cleanser: Soap and Water 1 x Per Week/7 Days Discharge Instructions: May shower and wash wound with dial antibacterial soap and water prior to dressing change. Cleanser: Wound Cleanser 1 x Per Week/7 Days Discharge Instructions: Cleanse the wound with wound cleanser or normal saline prior to applying a clean dressing using gauze sponges, not tissue or cotton balls. Peri-Wound Care: Sween Lotion (Moisturizing lotion) (Home Health) 1 x Per Week/7 Days Discharge Instructions: Apply moisturizing lotion as directed Prim Dressing: KerraCel Ag Gelling Fiber Dressing, 2x2 in (silver alginate) 1 x Per Week/7 Days ary Discharge Instructions: Apply silver alginate to wound bed as instructed Secondary Dressing: Woven Gauze Sponge, Non-Sterile 4x4 in 1 x Per Week/7 Days Discharge Instructions: Apply over primary dressing as directed. Secondary Dressing: ABD Pad, 5x9 1 x Per Week/7 Days Discharge Instructions: Apply over primary dressing as directed. Secondary Dressing: Optifoam  Non-Adhesive Dressing, 4x4 in 1 x Per Week/7 Days Discharge Instructions: Foam donut to help offload Compression Wrap: Kerlix Roll 4.5x3.1 (in/yd) 1 x Per Week/7 Days Discharge Instructions: Apply Kerlix and Coban compression as directed. Compression Wrap: Coban Self-Adherent Wrap 4x5 (in/yd) 1 x Per Week/7 Days Discharge Instructions: Apply over Kerlix as directed. Electronic Signature(s) Signed: 08/30/2021 5:32:36 PM By: Linton Ham MD Signed: 08/30/2021 5:45:49 PM By: Deon Pilling RN, BSN Entered By: Deon Pilling on 08/30/2021 16:22:14 --------------------------------------------------------------------------------  Problem List Details Patient Name: Date of Service: [name] 08/30/2021 3:15 PM Medical Record Number: [id number] Patient Account Number: [id number] Date of Birth/Sex: Treating RN: Dec 09, 1960 (60 y.o. 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, right ankle and foot 08/30/2021 No Yes E11.51 Type 2 diabetes mellitus with diabetic peripheral angiopathy without gangrene 02/19/2021 No Yes I87.2 Venous insufficiency (chronic) (peripheral) 06/18/2021 No Yes Inactive Problems ICD-10 Code Description Active Date Inactive Date L97.811 Non-pressure chronic ulcer of other part of right lower leg limited to breakdown of skin 02/19/2021 02/19/2021 S81.802A Unspecified open wound, left lower leg, initial encounter 06/18/2021 06/18/2021 S40.812D Abrasion of left upper arm, subsequent encounter 07/20/2021 07/20/2021 Resolved Problems ICD-10 Code Description Active Date Resolved Date S40.811D  Abrasion of right upper arm, subsequent encounter 03/29/2021 03/29/2021 Electronic Signature(s) Signed: 08/30/2021 5:32:36 PM By: Linton Ham MD Entered By: Linton Ham on 08/30/2021 16:51:40 -------------------------------------------------------------------------------- Progress Note Details Patient Name: Date of Service: [name], [name] 08/30/2021 3:15 PM Medical Record Number: [id number] Patient Account Number: [id number] Date of Birth/Sex: Treating RN: 07/28/1961 (60 y.o. [name] Primary Care Provider: Charlott Rakes Other Clinician: Referring Provider: Treating Provider/Extender: Sindy Guadeloupe Weeks in Treatment: 27 Subjective History of Present Illness (HPI) This 60 year old patient who has a very long significant history of diabetes mellitus, previous alcohol and nicotine abuse, chronic data disease, COPD, diabetes mellitus, height hypertension, critical lower limb ischemia with several wounds being managed at the wound Center at [facility] for over a year. She was recently in the ER at [facility] and was referred to our center. He has had a long history of critical limb ischemia and over a period of time has had balloon angioplasties in March 2016, endarterectomy of the left carotid, lower extremity angiogram and treatment by Dr. Quay Burow, several cardiac catheterizations. Most recently she had an x-ray of her left foot while in the ER which showed no acute abnormality. during this ER visit she was started on ciprofloxacin and asked to continue with the wound care physicians at [facility]. Her last ABI done in June 2017 showed the right side to be 0.28 in the left side to be 0.48. Her right toe brachial index was 0.17 on the right and 0.27 on the left. her last hemoglobin A1c was 11.1% She continues to smoke about a pack of cigarettes a day. 03/28/2017 -- -- right foot x-ray -- IMPRESSION:Areas of soft tissue swelling. Mild  subluxation second PIP joint. No frank dislocation or fracture. No erosive change or bony destruction. No soft tissue ulceration or radiopaque foreign body evident by radiography. There is plantar fascia calcification with a nearby inferior calcaneal spur. There are foci of arterial vascular calcification. 04/04/2017 -- the patient continues to be noncompliant and continues to complain of a lot of pain and has not done anything about quitting smoking Readmission: 06/26/18 on evaluation today patient presents for reevaluation she has not been seen in our office since May 2018. Since she was last seen here in our office she has undergone testing at Dr. Fransico Gerson Fauth office where it was revealed that she had an ABI of 0.68 with her previous ABI being 0.64 and a  left ABI of 0.38 with previous ABI being 0.34 this study which was performed on 04/05/18 was pretty much equivalent to the study performed on 11/22/17. The findings in the end revealed on the right would appear to be moderate right lower extremity arterial disease on the left Dr. Gwenlyn Found states moderate in the report but unfortunately this appears to be much more severe at 0.38 compared to the right. Subsequently the patient was scheduled to have angiography with Dr. Gwenlyn Found on 04/12/18. This was however canceled due to the fact that the patient was found to have chronic kidney disease stage III and it was to the point that he did not feel that it will be safe to pursue angiography at that point. She has not been on any antibiotics recently. At this point Dr. Gwenlyn Found has not rescheduled anything as far as the injured [place] according to the patient he is somewhat reluctant to do so. Nonetheless with her diminished blood flow this is gonna make it somewhat difficult for her to heal. 07/05/18; this is a patient I have not seen previously. She has very significant PAD as noted above. She apparently has had revascularization efforts by Dr. Gwenlyn Found on the right  on 3 occasions to the patient. She was supposed to have an attempted angiography on the left however this was canceled apparently because of stage III chronic renal failure. I will need to research all of this. She complains of significant pain in the wound and has claudication enough that when she walks to the end of the driveway she has to stop. She is been using silver alginate to the wounds on her legs and Iodoflex to the area on her left second toe. 07/13/18 on evaluation today patient's wounds actually appear to be doing about the same. She has an appointment she tells me within the next month that is September 2019 with Dr. Gwenlyn Found to discuss options to see if there's anything else that you can recommend or do for her. Nonetheless obviously what we're trying to do is do what we can to save her leg and in turn prevent any additional worsening or damage. None in the meantime we been mainly trying to manage her ulcers as best we can. 07/27/18; some improvement in the multiplicity of wounds on her left lower extremity and foot. She's been using silver alginate. I was unable to determine that she actually has an appointment with Dr. Gwenlyn Found. We are checking into this The patient's wound includes Left lateral foot, left plantar heel, her left anterior calf wound looks close to me, left fourth toe is very close to closed and the left medial calf is perhaps the largest open area READMISSION 02/19/2021 This is a now 60 year old woman with type 2 diabetes and continued cigarette smoking. She has known PAD. She was last in this clinic in 2019 at that point with wounds on her left foot. She left in a nonhealed state. On 06/28/2019 I see she had a left femoropopliteal by vein and vascular with an amputation of the fifth ray. These managed to heal over. Her last arterial studies were in February 2021. This showed an absent waveform at the posterior tibial artery on the right a dampened monophasic dorsalis pedis on  the right of 0.44. On the left again the PTA TA was absent her dorsalis pedis was 0.99 triphasic and her great toe pressure was 0.65. This would have been after her revascularization. Once again she tells me that Dr. Gwenlyn Found has done revascularization on  the right leg on 3 different occasions. She has a right transmetatarsal amputation apparently done by Dr. Sharol Given remotely but I do not see a note for these right lower extremity revascularization but I may not of looked back far enough. In any case she says that the she has a wound on the plantar aspect of the right transmetatarsal amputation site there is been there for several months. More recently she fell and had a wound on her right medial lower leg she had 5 sutures placed in the ER and then subsequently she has developed an area on the medial lower leg which was a blister that opened up. Past medical history includes type 2 diabetes, PAD, chronic renal failure, congestive heart failure, right transmetatarsal amputation, left fifth ray amputation, continued tobacco abuse, atrial fibrillation and cirrhosis. We did not attempt an ABI on the right leg today because of pain 02/26/2021; patient we admitted to the clinic last week. She has what looks to be 2 areas on her right medial and right anterior lower leg that look more like venous wounds but she has 1 on the first met head at the base of her right transmet. She has known severe PAD. She complains of a lot of pain although some of this may be neuropathic. Is difficult to exclude a component of claudication. Use silver alginate on the wounds on her legs and Iodoflex on the area on the first met head. She has an appointment with Dr. Gwenlyn Found on 5/25 although I will text him and discussed the situation. She is have poorly controlled diabetic. She has has stage IIIb chronic renal failure 4/22; patient presents for 1 week follow-up. The 2 areas on her right medial and right anterior lower leg appear  well-healing. She has been using silver alginate to this area without issues. She has a first met head ulcer and it is unclear how long this has been there as it was discovered in the ED earlier this month when she was being evaluated for another issue. Iodoflex has been used at this area. 4/29; patient presents for 1 week follow-up. She has been using silver alginate to the leg wounds and Iodoflex to the plantar foot wound. She has had this wrapped with Coban and Kerlix. She has no concerns or complaints today. 5/6; this is a difficult wound on the right plantar foot transmit site. We are not making a lot of progress. She had 2 more venous looking wounds on the right medial and right anterior lower leg 1 of which is healed. Her appointment with Dr. Gwenlyn Found is on 5/25 5/16; she did not tolerate the offloading shoe we gave her in fact she had a fall with an abrasion on her right forearm she is now back in the modified small shoe which does not offload her foot properly. Her appoint with Dr. Gwenlyn Found is still on 5/25 we have been using Iodoflex. The area on the right anterior lower leg is healed 7/18; patient presents for follow-up however has not been seen in 2 months. She was last seen at the end of May. She had a fall at the end of June and was hospitalized. She has been unable to follow-up since then. For the wound she has only been keeping it covered with gauze. She is scheduled for an aortogram with lower extremity runoff at the end of July. 7/29; patient presents for 2-week follow-up. She has home health and they have been changing her dressings. She denies any issues and has  no complaints today. She states she had a procedure where they opened up one of her vessels in her legs. She denies signs of infection. 8/5; patient presents for follow-up. She was recently in the hospital for bradycardia. She was noted to have cellulitis to the left lower extremity and Unna boot was placed due to blisters and  increased swelling. She reports improvement to her left leg since being in the hospital and stability check her right plantar foot wound. She denies signs of infection. 8/23; since I last saw this patient a lot has happened. She still has the area over the right first met head in the setting of previous transmetatarsal amputation. Firstly most importantly she underwent revascularization of her occluded right SFA. She underwent directional atherectomy followed by drug-coated balloon angioplasty. She has no named vessel below the knee hopefully the revascularization of the right SFA will improve collateral flow. It is not felt however that she has any endovascular options. She had follow-up arterial studies noninvasive on 8/9. These showed an ABI of 0.44 at the right PTA. Monophasic waveforms. On the left her great toe ABI was 0.68. Her follow-up arterial Doppler showed a 50 to 74% stenosis in the proximal SFA and a 50 to 74% stenosis in the distal SFA mild to severe atherosclerosis noted throughout the extremity. Areas of shadowing plaque seen, unable to rule out higher grade stenosis she also had a fall apparently wearing a right foot forefoot off loader that we gave her. She therefore comes in in an ordinary running shoe today. Not certain if this is the fall that ended up in the hospital with bradycardia 9/6 area over the right first metatarsal head in the setting of her previous amputation and severe PAD. She is also a diabetic with known PAD status post attempt at revascularization. She is continued smoker Again the separation of visits in the clinic is somewhat disturbing if we are going to consider her for a total contact cast. She is not wearing anything to offload this stating it causes imbalance especially the forefoot off loader. She basically comes in in bedroom slippers She also had a fall this morning about an hour ago. She has a skin tear on the left dorsal forearm 9/13; areas over the  right first metatarsal head in the setting of previous TMA and severe PAD. Again she comes in here in slippers. We have been using Hydrofera Blue. We use MolecuLight on this which was essentially negative study 9/20; right first metatarsal head in the setting of her previous TMA and severe PAD. Same slipper type shoes. She says she cannot wear a forefoot off loader and for some reason she will not wear surgical shoes. She says she is smoking half a pack per day. 9/30; right first metatarsal head. Absolutely no improvement in fact there is tunneling superiorly very close to bone. She does not offload this properly at all. We use MolecuLight on this 2 weeks ago that did not show any surface bacteria we have been using silver collagen is a dressing She tells me she is down to 3 cigarettes a day I have offered encouragement and a treatment plan 10/6; right first metatarsal head. Exposed bone this week which is not surprising. We have been using silver collagen. She is smoking 5 to 10 cigarettes a day 10/17; she has not been here in 10 days. The bone scraping that I did on 10/6 showed staph lugdunensis, staph epidermidis and Pseudomonas Alcalifenes. I put in for Septra DS  1 p.o. twice daily for 14 days last week would be but we could not reach her to start the antibiotic. Quinolones would have been a good alternative except she has multiple drug interactions. Septra would not cover what ever the Pseudomonas is but I am not really sure of the relevance here. I am going to try her on the Septra for 2 weeks. She has a probing wound on the right TMA that probes to bone. She claims to have just about stop smoking I reviewed her arterial status. She underwent a left fem-tib bypass by Dr. Donnetta Hutching in 06/28/2019. Her last angiogram was in July of this year by Dr. Gwenlyn Found. This showed an occluded right SFA the popliteal and tibial vessels were also occluded the peroneal vessel filled by collaterals and filled the PT and DP  by communicating collaterals. She was not felt to have any additional and vascular options. Dr. Gwenlyn Found noted that she he had previously revascularized her right SFA 3 times. Objective Constitutional Sitting or standing Blood Pressure is within target range for patient.. Pulse regular and within target range for patient.[name] Respirations regular, non-labored and within target range.. Temperature is normal and within the target range for the patient.[name] Appears in no distress. Vitals Time Taken: 3:35 PM, Height: 64 in, Weight: 222 lbs, BMI: 38.1, Temperature: 98.4 F, Pulse: 108 bpm, Respiratory Rate: 20 breaths/min, Blood Pressure: 137/78 mmHg, Capillary Blood Glucose: 208 mg/dl. Cardiovascular Pedal pulses are not palpable. General Notes: Wound exam; right first metatarsal head small wound but it probes to bone in the middle. There is no surrounding erythema. Integumentary (Hair, Skin) Wound #16 status is Open. Original cause of wound was Gradually Appeared. The date acquired was: 02/05/2021. The wound has been in treatment 27 weeks. The wound is located on the Right,Plantar Amputation Site - Transmetatarsal. The wound measures 1.1cm length x 1.2cm width x 0.5cm depth; 1.037cm^2 area and 0.518cm^3 volume. There is bone and Fat Layer (Subcutaneous Tissue) exposed. There is no undermining noted, however, there is tunneling at 6:00 with a maximum distance of 0.7cm. There is a medium amount of serosanguineous drainage noted. The wound margin is thickened. There is large (67-100%) red, pink granulation within the wound bed. There is a small (1-33%) amount of necrotic tissue within the wound bed including Adherent Slough. General Notes: macerated callous. Assessment Active Problems ICD-10 Unspecified open wound, right foot, subsequent encounter Type 2 diabetes mellitus with foot ulcer Other chronic osteomyelitis, right ankle and foot Type 2 diabetes mellitus with diabetic peripheral angiopathy without  gangrene Venous insufficiency (chronic) (peripheral) Plan Follow-up Appointments: Return Appointment in 1 week. - with Dr. Dellia Nims ****Go pick up oral antibiotics today and start taking today.**** Bathing/ Shower/ Hygiene: May shower with protection but do not get wound dressing(s) wet. Edema Control - Lymphedema / SCD / Other: Elevate legs to the level of the heart or above for 30 minutes daily and/or when sitting, a frequency of: - throughout the day Avoid standing for long periods of time. Patient to wear own compression stockings every day. - left leg Exercise regularly Off-Loading: Open toe surgical shoe to: - with felt padding to offload Additional Orders / Instructions: Stop/Decrease Smoking Follow Nutritious Diet Non Wound Condition: Other Non Wound Condition Orders/Instructions: - apply xeroform abd pad and kerlix to right forearm scratches. WOUND #16: - Amputation Site - Transmetatarsal Wound Laterality: Plantar, Right Cleanser: Soap and Water 1 x Per Week/7 Days Discharge Instructions: May shower and wash wound with dial antibacterial soap and water  prior to dressing change. Cleanser: Wound Cleanser 1 x Per Week/7 Days Discharge Instructions: Cleanse the wound with wound cleanser or normal saline prior to applying a clean dressing using gauze sponges, not tissue or cotton balls. Peri-Wound Care: Sween Lotion (Moisturizing lotion) (Home Health) 1 x Per Week/7 Days Discharge Instructions: Apply moisturizing lotion as directed Prim Dressing: KerraCel Ag Gelling Fiber Dressing, 2x2 in (silver alginate) 1 x Per Week/7 Days ary Discharge Instructions: Apply silver alginate to wound bed as instructed Secondary Dressing: Woven Gauze Sponge, Non-Sterile 4x4 in 1 x Per Week/7 Days Discharge Instructions: Apply over primary dressing as directed. Secondary Dressing: ABD Pad, 5x9 1 x Per Week/7 Days Discharge Instructions: Apply over primary dressing as directed. Secondary Dressing:  Optifoam Non-Adhesive Dressing, 4x4 in 1 x Per Week/7 Days Discharge Instructions: Foam donut to help offload Com pression Wrap: Kerlix Roll 4.5x3.1 (in/yd) 1 x Per Week/7 Days Discharge Instructions: Apply Kerlix and Coban compression as directed. Com pression Wrap: Coban Self-Adherent Wrap 4x5 (in/yd) 1 x Per Week/7 Days Discharge Instructions: Apply over Kerlix as directed. 1. I am going to try Septra DS for 14 days and see if she tolerates this. I do not think a quinolone was possible because of drug interactions 2. Although this does not cover the Pseudomonas well I am going to see how the wound reacts clinically 3. Still using silver alginate to the small wounded area. 4. Not ready for a total contact cast attempt 5. I have reviewed her vascular status she is even by my review not a candidate for any further revascularization. 6. She claims to have reduced her smoking I am just not sure how confident I am about this Electronic Signature(s) Signed: 08/30/2021 5:32:36 PM By: Linton Ham MD Entered By: Linton Ham on 08/30/2021 17:03:10 -------------------------------------------------------------------------------- SuperBill Details Patient Name: Date of Service: [name], [name] 08/30/2021 Medical Record Number: [id number] Patient Account Number: [id number] Date of Birth/Sex: Treating RN: 1961/11/03 (60 y.o. [name], Meta.Reding Primary Care Provider: Charlott Rakes Other Clinician: Referring Provider: Treating Provider/Extender: Sindy Guadeloupe Weeks in Treatment: 27 Diagnosis Coding ICD-10 Codes Code Description F20.721C Unspecified open wound, right foot, subsequent encounter 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 2 diabetes mellitus with foot ulcer M86.671 Other chronic osteomyelitis, right ankle and foot E11.51 Type 2 diabetes mellitus with diabetic peripheral angiopathy without gangrene I87.2 Venous insufficiency (chronic) (peripheral) Facility Procedures CPT4 Code:  [id number] Description: 99214 - WOUND CARE VISIT-LEV 4 EST PT Modifier: Quantity: 1 Physician Procedures : CPT4 Code Description Modifier [id number] - WC PHYS LEVEL 4 - EST PT ICD-10 Diagnosis Description S91.301D Unspecified open wound, right foot, subsequent encounter 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 2 diabetes mellitus with foot ulcer M86.671 Other chronic osteomyelitis,  right ankle and foot Quantity: 1 Electronic Signature(s) Signed: 08/30/2021 5:32:36 PM By: Linton Ham MD Entered By: Linton Ham on 08/30/2021 17:03:37

## 2021-08-30 NOTE — Progress Notes (Signed)
[name], [name] ([id number]) Visit Report for 08/30/2021 Fall Risk Assessment Details Patient Name: Date of Service: [name] 08/30/2021 3:15 PM Medical Record Number: [id number] Patient Account Number: [id number] Date of Birth/Sex: Treating RN: [birth year] (60 y.o. [name], Meta.Reding Primary Care Rakesha Dalporto: Charlott Rakes Other Clinician: Referring Vandella Ord: Treating Xylia Scherger/Extender: Sindy Guadeloupe Weeks in Treatment: 27 Fall Risk Assessment Items Have you had 2 or more falls in the last 12 monthso 0 Yes Have you had any fall that resulted in injury in the last 12 monthso 0 No FALLS RISK SCREEN History of falling - immediate or within 3 months 25 Yes Secondary diagnosis (Do you have 2 or more medical diagnoseso) 0 No Ambulatory aid None/bed rest/wheelchair/nurse 0 No Crutches/cane/walker 15 Yes Furniture 0 No Intravenous therapy Access/Saline/Heparin Lock 0 No Gait/Transferring Normal/ bed rest/ wheelchair 0 Yes Weak (short steps with or without shuffle, stooped but able to lift head while walking, may seek 0 No support from furniture) Impaired (short steps with shuffle, may have difficulty arising from chair, head down, impaired 0 No balance) Mental Status Oriented to own ability 0 Yes Electronic Signature(s) Signed: 08/30/2021 5:45:49 PM By: Deon Pilling RN, BSN Entered By: Deon Pilling on 08/30/2021 15:42:54

## 2021-08-30 NOTE — Progress Notes (Signed)
JAANAI, RAIA ([id number]) Visit Report for 08/30/2021 Arrival Information Details Patient Name: Date of Service: [name] 08/30/2021 3:15 PM Medical Record Number: [id number] Patient Account Number: [id number] Date of Birth/Sex: Treating RN: 23-Aug-1961 (60 y.o. [name] Primary Care Mansoor Hillyard: Charlott Rakes Other Clinician: Referring Megean Fabio: Treating Kenlee Vogt/Extender: Ramon Dredge in Treatment: 27 Visit Information History Since Last Visit Added or deleted any medications: No Patient Arrived: Gilford Rile Any new allergies or adverse reactions: No Arrival Time: 15:39 Had a fall or experienced change in Yes Accompanied By: self activities of daily living that may affect Transfer Assistance: None risk of falls: Patient Identification Verified: Yes Signs or symptoms of abuse/neglect since last visito No Secondary Verification Process Completed: Yes Hospitalized since last visit: No Patient Requires Transmission-Based Precautions: No Implantable device outside of the clinic excluding No Patient Has Alerts: No cellular tissue based products placed in the center since last visit: Has Dressing in Place as Prescribed: Yes Pain Present Now: Yes Notes Spoke with patient concerning phone numbers to contact patient, the need for the pharmacy, and attempted to reach her concerning the positive culture and the need for oral antibiotics. Electronic Signature(s) Signed: 08/30/2021 5:45:49 PM By: Deon Pilling RN, BSN Entered By: Deon Pilling on 08/30/2021 15:41:51 -------------------------------------------------------------------------------- Clinic Level of Care Assessment Details Patient Name: Date of Service: [name]. 08/30/2021 3:15 PM Medical Record Number: [id number] Patient Account Number: [id number] Date of Birth/Sex: Treating RN: 05/30/1961 (60 y.o. [name], Meta.Reding Primary Care Onesha Krebbs: Charlott Rakes Other  Clinician: Referring Shayne Deerman: Treating Bradleigh Sonnen/Extender: Sindy Guadeloupe Weeks in Treatment: 27 Clinic Level of Care Assessment Items TOOL 4 Quantity Score X- 1 0 Use when only an EandM is performed on FOLLOW-UP visit ASSESSMENTS - Nursing Assessment / Reassessment X- 1 10 Reassessment of Co-morbidities (includes updates in patient status) X- 1 5 Reassessment of Adherence to Treatment Plan ASSESSMENTS - Wound and Skin A ssessment / Reassessment '[]'$  - 0 Simple Wound Assessment / Reassessment - one wound X- 1 5 Complex Wound Assessment / Reassessment - multiple wounds X- 1 10 Dermatologic / Skin Assessment (not related to wound area) ASSESSMENTS - Focused Assessment X- 1 5 Circumferential Edema Measurements - multi extremities X- 1 10 Nutritional Assessment / Counseling / Intervention '[]'$  - 0 Lower Extremity Assessment (monofilament, tuning fork, pulses) '[]'$  - 0 Peripheral Arterial Disease Assessment (using hand held doppler) ASSESSMENTS - Ostomy and/or Continence Assessment and Care '[]'$  - 0 Incontinence Assessment and Management '[]'$  - 0 Ostomy Care Assessment and Management (repouching, etc.) PROCESS - Coordination of Care '[]'$  - 0 Simple Patient / Family Education for ongoing care X- 1 20 Complex (extensive) Patient / Family Education for ongoing care X- 1 10 Staff obtains Programmer, systems, Records, T Results / Process Orders est '[]'$  - 0 Staff telephones HHA, Nursing Homes / Clarify orders / etc '[]'$  - 0 Routine Transfer to another Facility (non-emergent condition) '[]'$  - 0 Routine Hospital Admission (non-emergent condition) '[]'$  - 0 New Admissions / Biomedical engineer / Ordering NPWT Apligraf, etc. , '[]'$  - 0 Emergency Hospital Admission (emergent condition) '[]'$  - 0 Simple Discharge Coordination X- 1 15 Complex (extensive) Discharge Coordination PROCESS - Special Needs '[]'$  - 0 Pediatric / Minor Patient Management '[]'$  - 0 Isolation Patient Management '[]'$  -  0 Hearing / Language / Visual special needs '[]'$  - 0 Assessment of Community assistance (transportation, D/C planning, etc.) '[]'$  - 0 Additional assistance / Altered mentation '[]'$  - 0 Support Surface(s)  Assessment (bed, cushion, seat, etc.) INTERVENTIONS - Wound Cleansing / Measurement '[]'$  - 0 Simple Wound Cleansing - one wound X- 1 5 Complex Wound Cleansing - multiple wounds X- 1 5 Wound Imaging (photographs - any number of wounds) '[]'$  - 0 Wound Tracing (instead of photographs) '[]'$  - 0 Simple Wound Measurement - one wound X- 1 5 Complex Wound Measurement - multiple wounds INTERVENTIONS - Wound Dressings '[]'$  - 0 Small Wound Dressing one or multiple wounds '[]'$  - 0 Medium Wound Dressing one or multiple wounds X- 1 20 Large Wound Dressing one or multiple wounds '[]'$  - 0 Application of Medications - topical '[]'$  - 0 Application of Medications - injection INTERVENTIONS - Miscellaneous '[]'$  - 0 External ear exam '[]'$  - 0 Specimen Collection (cultures, biopsies, blood, body fluids, etc.) '[]'$  - 0 Specimen(s) / Culture(s) sent or taken to Lab for analysis '[]'$  - 0 Patient Transfer (multiple staff / Civil Service fast streamer / Similar devices) '[]'$  - 0 Simple Staple / Suture removal (25 or less) '[]'$  - 0 Complex Staple / Suture removal (26 or more) '[]'$  - 0 Hypo / Hyperglycemic Management (close monitor of Blood Glucose) '[]'$  - 0 Ankle / Brachial Index (ABI) - do not check if billed separately X- 1 5 Vital Signs Has the patient been seen at the hospital within the last three years: Yes Total Score: 130 Level Of Care: New/Established - Level 4 Electronic Signature(s) Signed: 08/30/2021 5:45:49 PM By: Deon Pilling RN, BSN Entered By: Deon Pilling on 08/30/2021 16:19:55 -------------------------------------------------------------------------------- Encounter Discharge Information Details Patient Name: Date of Service: [name], [name] 08/30/2021 3:15 PM Medical Record Number: [id number] Patient Account  Number: [id number] Date of Birth/Sex: Treating RN: 1961-05-06 (60 y.o. [name] Primary Care Sheily Lineman: Charlott Rakes Other Clinician: Referring Ettamae Barkett: Treating Starlee Corralejo/Extender: Ramon Dredge in Treatment: 27 Encounter Discharge Information Items Discharge Condition: Stable Ambulatory Status: Walker Discharge Destination: Home Transportation: Private Auto Accompanied By: self Schedule Follow-up Appointment: Yes Clinical Summary of Care: Electronic Signature(s) Signed: 08/30/2021 5:45:49 PM By: Deon Pilling RN, BSN Entered By: Deon Pilling on 08/30/2021 16:20:21 -------------------------------------------------------------------------------- Lower Extremity Assessment Details Patient Name: Date of Service: [name], [name] 08/30/2021 3:15 PM Medical Record Number: [id number] Patient Account Number: [id number] Date of Birth/Sex: Treating RN: 04/08/1961 (60 y.o. [name] Primary Care Megyn Leng: Charlott Rakes Other Clinician: Referring Annasophia Crocker: Treating Takelia Urieta/Extender: Sindy Guadeloupe Weeks in Treatment: 27 Edema Assessment Assessed: [Left: No] [Right: Yes] Edema: [Left: Ye] [Right: s] Calf Left: Right: Point of Measurement: From Medial Instep 36 cm Ankle Left: Right: Point of Measurement: From Medial Instep 24 cm Vascular Assessment Pulses: Dorsalis Pedis Palpable: [Right:Yes] Electronic Signature(s) Signed: 08/30/2021 5:45:49 PM By: Deon Pilling RN, BSN Entered By: Deon Pilling on 08/30/2021 15:50:47 -------------------------------------------------------------------------------- Multi Wound Chart Details Patient Name: Date of Service: [name], [name] 08/30/2021 3:15 PM Medical Record Number: [id number] Patient Account Number: [id number] Date of Birth/Sex: Treating RN: Apr 01, 1961 (60 y.o. [name] Primary Care Marvelous Woolford: Charlott Rakes Other Clinician: Referring Caylor Tallarico: Treating  Kymberli Wiegand/Extender: Sindy Guadeloupe Weeks in Treatment: 27 Vital Signs Height(in): 64 Capillary Blood Glucose(mg/dl): 208 Weight(lbs): 222 Pulse(bpm): 108 Body Mass Index(BMI): 44 Blood Pressure(mmHg): 137/78 Temperature(F): 98.4 Respiratory Rate(breaths/min): 20 Photos: [N/A:N/A] Right, Plantar Amputation Site - N/A N/A Wound Location: Transmetatarsal Gradually Appeared N/A N/A Wounding Event: Diabetic Wound/Ulcer of the Lower N/A N/A Primary Etiology: Extremity Chronic Obstructive Pulmonary N/A N/A Comorbid History: Disease (COPD), Sleep Apnea, Arrhythmia, Congestive Heart Failure, Coronary  Artery Disease, Hypertension, Peripheral Arterial Disease, Cirrhosis , Type II Diabetes, Osteoarthritis, Neuropathy 02/05/2021 N/A N/A Date Acquired: 8 N/A N/A Weeks of Treatment: Open N/A N/A Wound Status: 1.1x1.2x0.5 N/A N/A Measurements L x W x D (cm) 1.037 N/A N/A A (cm) : rea 0.518 N/A N/A Volume (cm) : 36.50% N/A N/A % Reduction in A rea: -5.70% N/A N/A % Reduction in Volume: 6 Position 1 (o'clock): 0.7 Maximum Distance 1 (cm): Yes N/A N/A Tunneling: Grade 2 N/A N/A Classification: Medium N/A N/A Exudate A mount: Serosanguineous N/A N/A Exudate Type: red, brown N/A N/A Exudate Color: Thickened N/A N/A Wound Margin: Large (67-100%) N/A N/A Granulation A mount: Red, Pink N/A N/A Granulation Quality: Small (1-33%) N/A N/A Necrotic Amount: Fat Layer (Subcutaneous Tissue): Yes N/A N/A Exposed Structures: Bone: Yes Fascia: No Tendon: No Muscle: No Joint: No None N/A N/A Epithelialization: macerated callous. N/A N/A Assessment Notes: Treatment Notes 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 Wound Cleanser Discharge Instruction: Cleanse the wound with  wound cleanser or normal saline prior to applying a clean dressing using gauze sponges, not tissue or cotton balls. Peri-Wound Care Sween Lotion (Moisturizing lotion) Discharge Instruction: Apply moisturizing lotion as directed Topical Primary Dressing KerraCel Ag Gelling Fiber Dressing, 2x2 in (silver alginate) Discharge Instruction: Apply silver alginate to wound bed as instructed Secondary Dressing Woven Gauze Sponge, Non-Sterile 4x4 in Discharge Instruction: Apply over primary dressing as directed. ABD Pad, 5x9 Discharge Instruction: Apply over primary dressing as directed. Optifoam Non-Adhesive Dressing, 4x4 in Discharge Instruction: Foam donut to help offload Secured With Compression Wrap Kerlix Roll 4.5x3.1 (in/yd) Discharge Instruction: Apply Kerlix and Coban compression as directed. Coban Self-Adherent Wrap 4x5 (in/yd) Discharge Instruction: Apply over Kerlix as directed. Compression Stockings Add-Ons Notes xeroform, abd pad, and kerlix applied to right forearm. Electronic Signature(s) Signed: 08/30/2021 5:32:36 PM By: Linton Ham MD Signed: 08/30/2021 5:53:01 PM By: Lorrin Jackson Entered By: Linton Ham on 08/30/2021 16:51:51 -------------------------------------------------------------------------------- Multi-Disciplinary Care Plan Details Patient Name: Date of Service: [name], [name] 08/30/2021 3:15 PM Medical Record Number: [id number] Patient Account Number: [id number] Date of Birth/Sex: Treating RN: Nov 18, 1960 (60 y.o. [name] Primary Care Seamus Warehime: Charlott Rakes Other Clinician: Referring Demarques Pilz: Treating Bettye Sitton/Extender: Ramon Dredge in Treatment: [place] reviewed with physician Active Inactive Wound/Skin Impairment Nursing Diagnoses: Impaired tissue integrity Goals: Patient/caregiver will verbalize understanding of skin care regimen Date Initiated: 02/19/2021 Target Resolution  Date: 09/09/2021 Goal Status: Active Ulcer/skin breakdown will have a volume reduction of 30% by week 4 Date Initiated: 02/19/2021 Date Inactivated: 03/29/2021 Target Resolution Date: 04/09/2021 Goal Status: Unmet Unmet Reason: PAD Interventions: Assess patient/caregiver ability to obtain necessary supplies Assess patient/caregiver ability to perform ulcer/skin care regimen upon admission and as needed Assess ulceration(s) every visit Provide education on ulcer and skin care Treatment Activities: Skin care regimen initiated : 02/19/2021 Topical wound management initiated : 02/19/2021 Notes: Electronic Signature(s) Signed: 08/30/2021 5:45:49 PM By: Deon Pilling RN, BSN Entered By: Deon Pilling on 08/30/2021 15:53:16 -------------------------------------------------------------------------------- Pain Assessment Details Patient Name: Date of Service: [name], [name] 08/30/2021 3:15 PM Medical Record Number: [id number] Patient Account Number: [id number] Date of Birth/Sex: Treating RN: [birth year] (60 y.o. [name] Primary Care Aerith Canal: Charlott Rakes Other Clinician: Referring Rashee Marschall: Treating Macallan Ord/Extender: Sindy Guadeloupe Weeks in Treatment: 27 Active Problems Location of Pain  Severity and Description of Pain Patient Has Paino Yes Site Locations Pain Location: Generalized Pain, Pain in Ulcers Rate the pain. Current Pain Level: 6 Worst Pain Level: 10 Least Pain Level: 0 Tolerable Pain Level: 8 Pain Management and Medication Current Pain Management: Medication: No Cold Application: No Rest: No Massage: No Activity: No T.E.N.S.: No Heat Application: No Leg drop or elevation: No Is the Current Pain Management Adequate: Adequate How does your wound impact your activities of daily livingo Sleep: No Bathing: No Appetite: No Relationship With Others: No Bladder Continence: No Emotions: No Bowel Continence: No Work: No Toileting:  No Drive: No Dressing: No Hobbies: No Engineer, maintenance) Signed: 08/30/2021 5:45:49 PM By: Deon Pilling RN, BSN Entered By: Deon Pilling on 08/30/2021 15:42:34 -------------------------------------------------------------------------------- Patient/Caregiver Education Details Patient Name: Date of Service: [name] 10/17/2022andnbsp3:15 PM Medical Record Number: [id number] Patient Account Number: [id number] Date of Birth/Gender: Treating RN: Oct 19, 1961 (60 y.o. [name] Primary Care Physician: Charlott Rakes Other Clinician: Referring Physician: Treating Physician/Extender: Ramon Dredge in Treatment: 27 Education Assessment Education Provided To: Patient Education Topics Provided Wound/Skin Impairment: Handouts: Skin Care Do's and Dont's Methods: Explain/Verbal Responses: Reinforcements needed Electronic Signature(s) Signed: 08/30/2021 5:45:49 PM By: Deon Pilling RN, BSN Entered By: Deon Pilling on 08/30/2021 15:53:46 -------------------------------------------------------------------------------- Wound Assessment Details Patient Name: Date of Service: [name], [name] 08/30/2021 3:15 PM Medical Record Number: [id number] Patient Account Number: [id number] Date of Birth/Sex: Treating RN: [birth year] (60 y.o. [name], Meta.Reding Primary Care Krizia Flight: Charlott Rakes Other Clinician: Referring Letroy Vazguez: Treating Gaylan Fauver/Extender: Sindy Guadeloupe Weeks in Treatment: 27 Wound Status Wound Number: 16 Primary Diabetic Wound/Ulcer of the Lower Extremity Etiology: Wound Location: Right, Plantar Amputation Site - Transmetatarsal Wound Open Wounding Event: Gradually Appeared Status: Date Acquired: 02/05/2021 Comorbid Chronic Obstructive Pulmonary Disease (COPD), Sleep Apnea, Weeks Of Treatment: 27 History: Arrhythmia, Congestive Heart Failure, Coronary Artery Disease, Clustered Wound: No Hypertension,  Peripheral Arterial Disease, Cirrhosis , Type II Diabetes, Osteoarthritis, Neuropathy Photos Wound Measurements Length: (cm) 1.1 Width: (cm) 1.2 Depth: (cm) 0.5 Area: (cm) 1.037 Volume: (cm) 0.518 % Reduction in Area: 36.5% % Reduction in Volume: -5.7% Epithelialization: None Tunneling: Yes Position (o'clock): 6 Maximum Distance: (cm) 0.7 Undermining: No Wound Description Classification: Grade 2 Wound Margin: Thickened Exudate Amount: Medium Exudate Type: Serosanguineous Exudate Color: red, brown Foul Odor After Cleansing: No Slough/Fibrino Yes Wound Bed Granulation Amount: Large (67-100%) Exposed Structure Granulation Quality: Red, Pink Fascia Exposed: No Necrotic Amount: Small (1-33%) Fat Layer (Subcutaneous Tissue) Exposed: Yes Necrotic Quality: Adherent Slough Tendon Exposed: No Muscle Exposed: No Joint Exposed: No Bone Exposed: Yes Assessment Notes macerated callous. Treatment Notes 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 Wound Cleanser Discharge Instruction: Cleanse the wound with wound cleanser or normal saline prior to applying a clean dressing using gauze sponges, not tissue or cotton balls. Peri-Wound Care Sween Lotion (Moisturizing lotion) Discharge Instruction: Apply moisturizing lotion as directed Topical Primary Dressing KerraCel Ag Gelling Fiber Dressing, 2x2 in (silver alginate) Discharge Instruction: Apply silver alginate to wound bed as instructed Secondary Dressing Woven Gauze Sponge, Non-Sterile 4x4 in Discharge Instruction: Apply over primary dressing as directed. ABD Pad, 5x9 Discharge Instruction: Apply over primary dressing as directed. Optifoam Non-Adhesive Dressing, 4x4 in Discharge Instruction: Foam donut to help offload Secured With Compression  Wrap Kerlix Roll 4.5x3.1 (in/yd) Discharge Instruction:  Apply Kerlix and Coban compression as directed. Coban Self-Adherent Wrap 4x5 (in/yd) Discharge Instruction: Apply over Kerlix as directed. Compression Stockings Add-Ons Notes xeroform, abd pad, and kerlix applied to right forearm. Electronic Signature(s) Signed: 08/30/2021 5:45:49 PM By: Deon Pilling RN, BSN Entered By: Deon Pilling on 08/30/2021 15:52:04 -------------------------------------------------------------------------------- Vitals Details Patient Name: Date of Service: [name], [name] 08/30/2021 3:15 PM Medical Record Number: [id number] Patient Account Number: [id number] Date of Birth/Sex: Treating RN: [birth year] (60 y.o. [name], Tammi Klippel Primary Care Shaquelle Hernon: Charlott Rakes Other Clinician: Referring Adrijana Haros: Treating Han Lysne/Extender: Sindy Guadeloupe Weeks in Treatment: 27 Vital Signs Time Taken: 15:35 Temperature (F): 98.4 Height (in): 64 Pulse (bpm): 108 Weight (lbs): 222 Respiratory Rate (breaths/min): 20 Body Mass Index (BMI): 38.1 Blood Pressure (mmHg): 137/78 Capillary Blood Glucose (mg/dl): 208 Reference Range: 80 - 120 mg / dl Electronic Signature(s) Signed: 08/30/2021 5:45:49 PM By: Deon Pilling RN, BSN Entered By: Deon Pilling on 08/30/2021 15:42:39

## 2021-09-02 ENCOUNTER — Other Ambulatory Visit (HOSPITAL_COMMUNITY): Payer: Self-pay | Admitting: Cardiology

## 2021-09-02 ENCOUNTER — Other Ambulatory Visit (HOSPITAL_COMMUNITY): Payer: Self-pay

## 2021-09-02 NOTE — Progress Notes (Addendum)
Paramedicine Encounter    Patient ID: [name], female    DOB: [birth year], 60 y.o.   MRN: [id number]   Patient Care Team: Charlott Rakes, MD as PCP - General (Family Medicine) [name], MD as PCP - Cardiology (Cardiology) [name], MD as PCP - Electrophysiology (Cardiology) [name], MD as Consulting Physician (Cardiology) [name], MD as Consulting Physician (Pulmonary Disease) System, Provider Not In  Patient Active Problem List   Diagnosis Date Noted   Hyperlipidemia associated with type 2 diabetes mellitus ([place]) 07/29/2021   (HFpEF) heart failure with preserved ejection fraction ([place]) 07/29/2021   Bradycardia 06/13/2021   Uncontrolled type 2 diabetes mellitus with hyperglycemia ([place]) 06/13/2021   Acute kidney injury superimposed on CKD ([place]) 06/13/2021   Critical ischemia of foot ([place]) 06/07/2021   Fall 05/05/2021   PAF (paroxysmal atrial fibrillation) ([place]) 05/05/2021   COPD (chronic obstructive pulmonary disease) ([place]) 05/05/2021   DM (diabetes mellitus), type 2 with complications ([place]) 38/25/0539   PAD (peripheral artery disease) ([place]) 05/05/2021   Cellulitis 05/05/2021   Acute on chronic combined systolic and diastolic CHF (congestive heart failure) ([place]) 05/05/2021   OSA (obstructive sleep apnea) 05/05/2021   CKD (chronic kidney disease) stage 3, GFR 30-59 ml/min ([place]) 05/05/2021   AKI (acute kidney injury) ([place]) 05/04/2021   Pressure injury of skin 05/04/2021   Ulcer of foot, unspecified laterality, limited to breakdown of skin ([place]) 02/17/2021   Altered mental status 01/02/2021   AF (paroxysmal atrial fibrillation) ([place]) 01/02/2021   CAD (coronary artery disease) 01/02/2021   PVD (peripheral vascular disease) ([place]) 01/02/2021   Acute renal failure superimposed on stage 4 chronic kidney disease ([place]) 01/02/2021   Diabetes mellitus type 2, uncontrolled, with complications 76/73/4193   Diabetic nephropathy ([place]) 01/02/2021   Low  back pain 06/01/2020   Hyperlipidemia 04/03/2020   Muscle weakness 03/20/2020   Closed fracture of neck of right radius 03/13/2020   Pain in elbow 03/12/2020   PAD (peripheral artery disease) ([place]) 12/26/2019   Acute renal failure ([place])    Acute respiratory failure with hypoxia ([place]) 12/02/2019   Acute exacerbation of CHF (congestive heart failure) ([place]) 12/01/2019   Cellulitis in diabetic foot ([place]) 07/08/2019   Osteomyelitis ([place]) 06/21/2019   NICM (nonischemic cardiomyopathy) ([place]) 06/20/2019   Non-healing ulcer ([place]) 06/20/2019   Acute CHF (congestive heart failure) ([place]) 11/26/2018   CHF (congestive heart failure) ([place]) 11/26/2018   Acute respiratory failure ([place]) 10/21/2018   AKI (acute kidney injury) ([place]) 08/18/2018   Coagulation disorder ([place]) 08/09/2017   Depression 07/21/2017   At risk for adverse drug reaction 06/20/2017   Peripheral neuropathy 06/20/2017   Acute osteomyelitis of right foot ([place]) 06/13/2017   S/P transmetatarsal amputation of foot, right ([place]) 06/05/2017   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 ([place]) 05/19/2017   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[place])    SIRS (systemic inflammatory response syndrome) ([place]) 04/06/2017   CKD (chronic kidney disease), stage III ([place]) 11/24/2016   Suspected sleep apnea 11/24/2016   Ulcer of toe of right foot, with necrosis of bone ([place]) 10/27/2016   Ulcer of left lower leg ([place]) 05/19/2016   Severe obesity (BMI >= 40) ([place]) 02/24/2016   COPD GOLD 0  02/24/2016   Morbid obesity ([place]) 02/24/2016   Encounter for therapeutic drug monitoring 02/10/2016   Symptomatic bradycardia 01/12/2016   Hypertension associated with diabetes ([place]) 12/22/2015   Chronic combined systolic and diastolic CHF (congestive heart failure) (  [place])    [place]    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 11/08/2015   Tobacco abuse 10/23/2015   Coronary artery disease    DOE (dyspnea on exertion) 04/29/2015    Diabetes mellitus type 2, uncontrolled 02/08/2015   Influenza A 02/07/2015   Wrist fracture, left, with routine healing, subsequent encounter 02/05/2015   Wrist fracture, left, closed, initial encounter 01/29/2015   PAF (paroxysmal atrial fibrillation) ([place]) 01/16/2015   Carotid arterial disease ([place]) 01/16/2015   Claudication ([place]) 01/15/2015   Demand ischemia ([place]) 10/29/2014   Insomnia 02/03/2014   COPD with acute exacerbation ([place]) 02/01/2014   S/P peripheral artery angioplasty - TurboHawk atherectomy; R SFA 09/11/2013    Class: Acute   Leg pain, bilateral 08/19/2013   Hypothyroidism 07/31/2013   Cellulitis 06/13/2013   History of cocaine abuse ([place]) 06/13/2013   Long term current use of anticoagulant therapy 05/20/2013   Alcohol abuse    Narcotic abuse ([place])    Marijuana abuse    Alcoholic cirrhosis ([name])    DM (diabetes mellitus), type 2 with peripheral vascular complications ([name])     Current Outpatient Medications:    ACCU-CHEK GUIDE test strip, USE TO CHECK BLOOD SUGAR FOUR TIMES DAILY, Disp: , Rfl:    acetaminophen (TYLENOL) 500 MG tablet, Take 500 mg by mouth every 6 (six) hours as needed for moderate pain., Disp: , Rfl:    albuterol (VENTOLIN HFA) 108 (90 Base) MCG/ACT inhaler, Inhale 2 puffs into the lungs every 6 (six) hours as needed for wheezing or shortness of breath., Disp: 18 g, Rfl: 3   allopurinol (ZYLOPRIM) 100 MG tablet, Take 1 tablet (100 mg total) by mouth daily., Disp: 30 tablet, Rfl: 6   amiodarone (PACERONE) 200 MG tablet, Take 0.5 tablets (100 mg total) by mouth daily., Disp: 45 tablet, Rfl: 3   amLODipine (NORVASC) 10 MG tablet, Take 1 tablet (10 mg total) by mouth daily., Disp: 90 tablet, Rfl: 3   apixaban (ELIQUIS) 5 MG TABS tablet, Take 5 mg by mouth 2 (two) times daily., Disp: , Rfl:    atorvastatin (LIPITOR) 40 MG tablet, Take 40 mg by mouth every evening., Disp: , Rfl:    budesonide-formoterol (SYMBICORT) 160-4.5 MCG/ACT inhaler, Inhale 2 puffs  into the lungs 2 (two) times daily., Disp: 1 Inhaler, Rfl: 2   clopidogrel (PLAVIX) 75 MG tablet, Take 1 tablet (75 mg total) by mouth daily with breakfast., Disp: 90 tablet, Rfl: 1   colchicine 0.6 MG tablet, Take 2 tabs (1.2 mg) at the onset of a gout flare, may take 1 tab (0.6 mg) in 1 hour if symptoms, Disp: 30 tablet, Rfl: 3   Dulaglutide (TRULICITY) 1.5 0000000 SOPN, Inject 1.5 mg into the skin once a week., Disp: 2 mL, Rfl: 6   empagliflozin (JARDIANCE) 10 MG TABS tablet, Take 1 tablet (10 mg total) by mouth daily before breakfast., Disp: 90 tablet, Rfl: 3   FLUoxetine (PROZAC) 20 MG capsule, Take 1 capsule (20 mg total) by mouth at bedtime., Disp: 90 capsule, Rfl: 1   folic acid (FOLVITE) 1 MG tablet, Take 1 tablet (1 mg total) by mouth daily., Disp: 90 tablet, Rfl: 1   hydrALAZINE (APRESOLINE) 50 MG tablet, Take 1 tablet (50 mg total) by mouth 3 (three) times daily., Disp: 90 tablet, Rfl: 6   insulin glargine (LANTUS) 100 UNIT/ML Solostar Pen, Inject 30 Units into the skin daily., Disp: 30 mL, Rfl: 6   insulin lispro (HUMALOG) 100 UNIT/ML KwikPen, Inject 8 Units into the skin  3 (three) times daily with meals., Disp: 15 mL, Rfl: 0   Insulin Pen Needle 32G X 4 MM MISC, USE AS DIRECTED WITH INSULIN PENS, Disp: 100 each, Rfl: 0   levothyroxine (SYNTHROID, LEVOTHROID) 50 MCG tablet, Take 1 tablet (50 mcg total) by mouth daily., Disp: 30 tablet, Rfl: 0   losartan (COZAAR) 25 MG tablet, Take 2 tablets (50 mg total) by mouth daily., Disp: 180 tablet, Rfl: 1   nortriptyline (PAMELOR) 25 MG capsule, Take 1 capsule (25 mg total) by mouth at bedtime. For 2 weeks then taper down to every other day for 2-weeks (Patient taking differently: Take 25 mg by mouth at bedtime. For 2 weeks then taper down to every other day for 2-weeks --this week it was placed for Thursday, sat, mon, wed night), Disp: 30 capsule, Rfl: 0   Omega-3 Fatty Acids (FISH OIL PO), Take 1 capsule by mouth daily., Disp: , Rfl:     ondansetron (ZOFRAN) 4 MG tablet, Take 1 tablet (4 mg total) by mouth every 8 (eight) hours as needed for nausea or vomiting., Disp: 20 tablet, Rfl: 0   pantoprazole (PROTONIX) 40 MG tablet, Take 1 tablet (40 mg total) by mouth daily., Disp:  , Rfl:    sulfamethoxazole-trimethoprim (BACTRIM DS) 800-160 MG tablet, Take 1 tablet by mouth 2 (two) times daily., Disp: , Rfl:    torsemide (DEMADEX) 20 MG tablet, Take 60-80 mg by mouth See admin instructions. Take 4 tablets by mouth in the morning, then take 3 tablets by mouth in the afternoon per patient, Disp: , Rfl:    VITAMIN D PO, Take 1 tablet by mouth daily., Disp: , Rfl:    benzonatate (TESSALON) 100 MG capsule, Take 1 capsule (100 mg total) by mouth 2 (two) times daily as needed for cough. (Patient not taking: No sig reported), Disp: 20 capsule, Rfl: 0   Continuous Blood Gluc Sensor (FREESTYLE LIBRE 2 SENSOR) MISC, USE 1 (ONE) EACH EVERY 2 WEEKS (Patient not taking: No sig reported), Disp: 2 each, Rfl: 11   Misc. Devices MISC, 2 L oxygen continuously via nasal cannula.  Diagnosis chronic respiratory failure with hypoxia (Patient not taking: Reported on 09/02/2021), Disp: 1 each, Rfl: 0   predniSONE (DELTASONE) 20 MG tablet, Take 2 tablets (40 mg total) by mouth daily with breakfast. (Patient not taking: No sig reported), Disp: 8 tablet, Rfl: 0 Allergies  Allergen Reactions   Gabapentin Nausea And Vomiting and Other (See Comments)    POSSIBLE SHAKING   Lyrica [Pregabalin] Other (See Comments)    Shaking       Social History   Socioeconomic History   Marital status: Single    Spouse name: Not on file   Number of children: 1   Years of education: 12   Highest education level: 12th grade  Occupational History   Occupation: disabled  Tobacco Use   Smoking status: Every Day    Packs/day: 1.00    Years: 44.00    Pack years: 44.00    Types: E-cigarettes, Cigarettes   Smokeless tobacco: Former    Types: Snuff  Vaping Use   Vaping Use:  Former   Devices: 11/26/2018 "stopped months ago"  Substance and Sexual Activity   Alcohol use: Yes    Comment: occ   Drug use: Yes    Types: "Crack" cocaine, Marijuana, Oxycodone   Sexual activity: Not Currently  Other Topics Concern   Not on file  Social History Narrative   ** Merged History Encounter **  Lives in [place], in [place] with sister.  They are looking to move but don't have a place to go yet.     Social Determinants of Health   Financial Resource Strain: Low Risk    Difficulty of Paying Living Expenses: Not very hard  Food Insecurity: Not on file  Transportation Needs: Not on file  Physical Activity: Not on file  Stress: Not on file  Social Connections: Not on file  Intimate Partner Violence: Not on file    Physical Exam      Future Appointments  Date Time Provider [place]  09/07/2021  1:30 PM Ricard Dillon, MD [facility]  10/01/2021  2:30 PM [name], MD [facility]  10/21/2021  1:40 PM Larey Dresser, MD [facility] None  11/16/2021  3:30 PM Charlott Rakes, MD [facility] None  12/22/2021  1:00 PM [facility] 3 [facility]    BP (!) 160/82   Pulse 95   Resp 18   Wt 216 lb (98 kg)   LMP  (LMP Unknown)   SpO2 96%   BMI 37.08 kg/m  CBG PTA-201 Weight yesterday-? Last visit weight-214 @ CLINIC   Pt reports she is doing ok, she still has not gotten her 02 yet. Will send Opal Sidles message to see if they have attempted to reach her, she is difficult to reach at times.  She had one episode of runny nose then when she blew her nose and it was bloody-none since.  She just got her antibiotic 3 days ago and started those on same day.  We just started the every other night dose for the nortriptyline. After this is done for 2 wks then I will request new rx of the '10mg'$  from PCP as noted in her last visit there.  Refills needed-  Allopurinol Amio? Atorvastatin-[facility] Clopidogrel-[facility] Hydralazine Losartan-'50mg'$    Pantoprazole Torsemide-  They are going to fill and bring out today the clopidogrel, losartan, torsemide and pantoprazole.   Will get rest sent out next week.   Meds verified and pill box refilled.  Will be back out next week.    Marylouise Stacks, [facility] Paramedic  09/02/21

## 2021-09-06 ENCOUNTER — Telehealth (HOSPITAL_COMMUNITY): Payer: Self-pay

## 2021-09-06 NOTE — Telephone Encounter (Signed)
Contacted pharmacy to request the following for refills:  Allopurinol Amiodarone  Atorvastatin  Clopidogrel Hydralazine   Marylouise Stacks, EMT-Paramedic  09/06/21

## 2021-09-07 ENCOUNTER — Other Ambulatory Visit: Payer: Self-pay

## 2021-09-07 ENCOUNTER — Encounter (HOSPITAL_BASED_OUTPATIENT_CLINIC_OR_DEPARTMENT_OTHER): Payer: Medicare Other | Admitting: Internal Medicine

## 2021-09-07 DIAGNOSIS — S91301D Unspecified open wound, right foot, subsequent encounter: Secondary | ICD-10-CM | POA: Diagnosis not present

## 2021-09-08 ENCOUNTER — Other Ambulatory Visit (HOSPITAL_COMMUNITY)
Admission: RE | Admit: 2021-09-08 | Discharge: 2021-09-08 | Disposition: A | Discharge status: Home / Self Care | Payer: Medicare Other | Source: Ambulatory Visit | Attending: Internal Medicine | Admitting: Internal Medicine

## 2021-09-08 ENCOUNTER — Telehealth (HOSPITAL_COMMUNITY): Payer: Self-pay | Admitting: Licensed Clinical Social Worker

## 2021-09-08 DIAGNOSIS — I5032 Chronic diastolic (congestive) heart failure: Secondary | ICD-10-CM | POA: Diagnosis present

## 2021-09-08 LAB — BASIC METABOLIC PANEL
Anion gap: 7 (ref 5–15)
BUN: 29 mg/dL — ABNORMAL HIGH (ref 6–20)
CO2: 31 mmol/L (ref 22–32)
Calcium: 8.8 mg/dL — ABNORMAL LOW (ref 8.9–10.3)
Chloride: 97 mmol/L — ABNORMAL LOW (ref 98–111)
Creatinine, Ser: 1.92 mg/dL — ABNORMAL HIGH (ref 0.44–1.00)
GFR, Estimated: 30 mL/min — ABNORMAL LOW (ref >60)
Glucose, Bld: 147 mg/dL — ABNORMAL HIGH (ref 70–99)
Potassium: 4.3 mmol/L (ref 3.5–5.1)
Sodium: 135 mmol/L (ref 135–145)

## 2021-09-08 NOTE — Telephone Encounter (Signed)
HF Paramedicine Team Based Care Meeting  HF MD- NA  HF NP - [place] NP-C   [facility] admit within the last 30 days for heart failure? ED visit 9/27 but not heart failure related  Medications concerns? Continues to need help with pill box. Has medications at 3 different pharmacies so planning to have this consolidated and put in pillboxes.   Transportation issues ? no  Education needs? Continued HF education but concerns with how teachable she is.  SDOH -no  Eligible for discharge? Not at this time  Jorge Ny, [place] Worker [facility] Desk#: [phone] Cell#: [phone]

## 2021-09-09 ENCOUNTER — Telehealth: Payer: Self-pay

## 2021-09-09 ENCOUNTER — Other Ambulatory Visit (HOSPITAL_COMMUNITY): Payer: Self-pay

## 2021-09-09 NOTE — Progress Notes (Addendum)
Paramedicine Encounter    Patient ID: [name], female    DOB: [birth year], 60 y.o.   MRN: [id number]   Patient Care Team: Charlott Rakes, MD as PCP - General (Family Medicine) [name], MD as PCP - Cardiology (Cardiology) [name], MD as PCP - Electrophysiology (Cardiology) [name], MD as Consulting Physician (Cardiology) [name], MD as Consulting Physician (Pulmonary Disease) System, Provider Not In  Patient Active Problem List   Diagnosis Date Noted   Hyperlipidemia associated with type 2 diabetes mellitus ([place]) 07/29/2021   (HFpEF) heart failure with preserved ejection fraction ([place]) 07/29/2021   Bradycardia 06/13/2021   Uncontrolled type 2 diabetes mellitus with hyperglycemia ([place]) 06/13/2021   Acute kidney injury superimposed on CKD ([place]) 06/13/2021   Critical ischemia of foot ([place]) 06/07/2021   Fall 05/05/2021   PAF (paroxysmal atrial fibrillation) ([place]) 05/05/2021   COPD (chronic obstructive pulmonary disease) ([place]) 05/05/2021   DM (diabetes mellitus), type 2 with complications ([place]) 74/06/1447   PAD (peripheral artery disease) ([place]) 05/05/2021   Cellulitis 05/05/2021   Acute on chronic combined systolic and diastolic CHF (congestive heart failure) ([place]) 05/05/2021   OSA (obstructive sleep apnea) 05/05/2021   CKD (chronic kidney disease) stage 3, GFR 30-59 ml/min ([place]) 05/05/2021   AKI (acute kidney injury) ([place]) 05/04/2021   Pressure injury of skin 05/04/2021   Ulcer of foot, unspecified laterality, limited to breakdown of skin ([place]) 02/17/2021   Altered mental status 01/02/2021   AF (paroxysmal atrial fibrillation) ([place]) 01/02/2021   CAD (coronary artery disease) 01/02/2021   PVD (peripheral vascular disease) ([place]) 01/02/2021   Acute renal failure superimposed on stage 4 chronic kidney disease ([place]) 01/02/2021   Diabetes mellitus type 2, uncontrolled, with complications 18/56/3149   Diabetic nephropathy ([place]) 01/02/2021   Low  back pain 06/01/2020   Hyperlipidemia 04/03/2020   Muscle weakness 03/20/2020   Closed fracture of neck of right radius 03/13/2020   Pain in elbow 03/12/2020   PAD (peripheral artery disease) ([place]) 12/26/2019   Acute renal failure ([place])    Acute respiratory failure with hypoxia ([place]) 12/02/2019   Acute exacerbation of CHF (congestive heart failure) ([place]) 12/01/2019   Cellulitis in diabetic foot ([place]) 07/08/2019   Osteomyelitis ([place]) 06/21/2019   NICM (nonischemic cardiomyopathy) ([place]) 06/20/2019   Non-healing ulcer ([place]) 06/20/2019   Acute CHF (congestive heart failure) ([place]) 11/26/2018   CHF (congestive heart failure) ([place]) 11/26/2018   Acute respiratory failure ([place]) 10/21/2018   AKI (acute kidney injury) ([place]) 08/18/2018   Coagulation disorder ([place]) 08/09/2017   Depression 07/21/2017   At risk for adverse drug reaction 06/20/2017   Peripheral neuropathy 06/20/2017   Acute osteomyelitis of right foot ([place]) 06/13/2017   S/P transmetatarsal amputation of foot, right ([place]) 06/05/2017   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 ([place]) 05/19/2017   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[place])    SIRS (systemic inflammatory response syndrome) ([place]) 04/06/2017   CKD (chronic kidney disease), stage III ([place]) 11/24/2016   Suspected sleep apnea 11/24/2016   Ulcer of toe of right foot, with necrosis of bone ([place]) 10/27/2016   Ulcer of left lower leg ([place]) 05/19/2016   Severe obesity (BMI >= 40) ([place]) 02/24/2016   COPD GOLD 0  02/24/2016   Morbid obesity ([place]) 02/24/2016   Encounter for therapeutic drug monitoring 02/10/2016   Symptomatic bradycardia 01/12/2016   Hypertension associated with diabetes ([place]) 12/22/2015   Chronic combined systolic and diastolic CHF (congestive heart failure) (  [place])    [place]    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 11/08/2015   Tobacco abuse 10/23/2015   Coronary artery disease    DOE (dyspnea on exertion) 04/29/2015    Diabetes mellitus type 2, uncontrolled 02/08/2015   Influenza A 02/07/2015   Wrist fracture, left, with routine healing, subsequent encounter 02/05/2015   Wrist fracture, left, closed, initial encounter 01/29/2015   PAF (paroxysmal atrial fibrillation) ([place]) 01/16/2015   Carotid arterial disease ([place]) 01/16/2015   Claudication ([place]) 01/15/2015   Demand ischemia ([place]) 10/29/2014   Insomnia 02/03/2014   COPD with acute exacerbation ([place]) 02/01/2014   S/P peripheral artery angioplasty - TurboHawk atherectomy; R SFA 09/11/2013    Class: Acute   Leg pain, bilateral 08/19/2013   Hypothyroidism 07/31/2013   Cellulitis 06/13/2013   History of cocaine abuse ([place]) 06/13/2013   Long term current use of anticoagulant therapy 05/20/2013   Alcohol abuse    Narcotic abuse ([place])    Marijuana abuse    Alcoholic cirrhosis ([name])    DM (diabetes mellitus), type 2 with peripheral vascular complications ([name])     Current Outpatient Medications:    ACCU-CHEK GUIDE test strip, USE TO CHECK BLOOD SUGAR FOUR TIMES DAILY, Disp: , Rfl:    acetaminophen (TYLENOL) 500 MG tablet, Take 500 mg by mouth every 6 (six) hours as needed for moderate pain., Disp: , Rfl:    albuterol (VENTOLIN HFA) 108 (90 Base) MCG/ACT inhaler, Inhale 2 puffs into the lungs every 6 (six) hours as needed for wheezing or shortness of breath., Disp: 18 g, Rfl: 3   allopurinol (ZYLOPRIM) 100 MG tablet, Take 1 tablet (100 mg total) by mouth daily., Disp: 30 tablet, Rfl: 6   amiodarone (PACERONE) 200 MG tablet, Take 0.5 tablets (100 mg total) by mouth daily., Disp: 45 tablet, Rfl: 3   amLODipine (NORVASC) 10 MG tablet, Take 1 tablet (10 mg total) by mouth daily., Disp: 90 tablet, Rfl: 3   apixaban (ELIQUIS) 5 MG TABS tablet, Take 5 mg by mouth 2 (two) times daily., Disp: , Rfl:    atorvastatin (LIPITOR) 40 MG tablet, Take 40 mg by mouth every evening., Disp: , Rfl:    budesonide-formoterol (SYMBICORT) 160-4.5 MCG/ACT inhaler, Inhale 2 puffs  into the lungs 2 (two) times daily., Disp: 1 Inhaler, Rfl: 2   clopidogrel (PLAVIX) 75 MG tablet, TAKE 1 TABLET (75 MG TOTAL) BY MOUTH DAILY., Disp: 30 tablet, Rfl: 11   colchicine 0.6 MG tablet, Take 2 tabs (1.2 mg) at the onset of a gout flare, may take 1 tab (0.6 mg) in 1 hour if symptoms, Disp: 30 tablet, Rfl: 3   Dulaglutide (TRULICITY) 1.5 CV/8.9FY SOPN, Inject 1.5 mg into the skin once a week., Disp: 2 mL, Rfl: 6   empagliflozin (JARDIANCE) 10 MG TABS tablet, Take 1 tablet (10 mg total) by mouth daily before breakfast., Disp: 90 tablet, Rfl: 3   FLUoxetine (PROZAC) 20 MG capsule, Take 1 capsule (20 mg total) by mouth at bedtime., Disp: 90 capsule, Rfl: 1   folic acid (FOLVITE) 1 MG tablet, Take 1 tablet (1 mg total) by mouth daily., Disp: 90 tablet, Rfl: 1   hydrALAZINE (APRESOLINE) 50 MG tablet, Take 1 tablet (50 mg total) by mouth 3 (three) times daily., Disp: 90 tablet, Rfl: 6   insulin glargine (LANTUS) 100 UNIT/ML Solostar Pen, Inject 30 Units into the skin daily., Disp: 30 mL, Rfl: 6   insulin lispro (HUMALOG) 100 UNIT/ML KwikPen, Inject 8 Units into the skin 3 (three)  times daily with meals., Disp: 15 mL, Rfl: 0   Insulin Pen Needle 32G X 4 MM MISC, USE AS DIRECTED WITH INSULIN PENS, Disp: 100 each, Rfl: 0   levothyroxine (SYNTHROID, LEVOTHROID) 50 MCG tablet, Take 1 tablet (50 mcg total) by mouth daily., Disp: 30 tablet, Rfl: 0   losartan (COZAAR) 25 MG tablet, Take 2 tablets (50 mg total) by mouth daily. (Patient taking differently: Take 50 mg by mouth at bedtime.), Disp: 180 tablet, Rfl: 1   nortriptyline (PAMELOR) 25 MG capsule, Take 1 capsule (25 mg total) by mouth at bedtime. For 2 weeks then taper down to every other day for 2-weeks (Patient taking differently: Take 25 mg by mouth at bedtime. For 2 weeks then taper down to every other day for 2-weeks --placed for fri/sun/tues/thur/sat/mon/wed), Disp: 30 capsule, Rfl: 0   Omega-3 Fatty Acids (FISH OIL PO), Take 1 capsule by mouth  daily., Disp: , Rfl:    ondansetron (ZOFRAN) 4 MG tablet, Take 1 tablet (4 mg total) by mouth every 8 (eight) hours as needed for nausea or vomiting., Disp: 20 tablet, Rfl: 0   pantoprazole (PROTONIX) 40 MG tablet, Take 1 tablet (40 mg total) by mouth daily., Disp:  , Rfl:    sulfamethoxazole-trimethoprim (BACTRIM DS) 800-160 MG tablet, Take 1 tablet by mouth 2 (two) times daily., Disp: , Rfl:    torsemide (DEMADEX) 20 MG tablet, Take 60-80 mg by mouth See admin instructions. Take 4 tablets by mouth in the morning, then take 3 tablets by mouth in the afternoon per patient, Disp: , Rfl:    VITAMIN D PO, Take 1 tablet by mouth daily., Disp: , Rfl:    benzonatate (TESSALON) 100 MG capsule, Take 1 capsule (100 mg total) by mouth 2 (two) times daily as needed for cough. (Patient not taking: No sig reported), Disp: 20 capsule, Rfl: 0   Continuous Blood Gluc Sensor (FREESTYLE LIBRE 2 SENSOR) MISC, USE 1 (ONE) EACH EVERY 2 WEEKS (Patient not taking: No sig reported), Disp: 2 each, Rfl: 11   Misc. Devices MISC, 2 L oxygen continuously via nasal cannula.  Diagnosis chronic respiratory failure with hypoxia (Patient not taking: No sig reported), Disp: 1 each, Rfl: 0   predniSONE (DELTASONE) 20 MG tablet, Take 2 tablets (40 mg total) by mouth daily with breakfast. (Patient not taking: No sig reported), Disp: 8 tablet, Rfl: 0 Allergies  Allergen Reactions   Gabapentin Nausea And Vomiting and Other (See Comments)    POSSIBLE SHAKING   Lyrica [Pregabalin] Other (See Comments)    Shaking       Social History   Socioeconomic History   Marital status: Single    Spouse name: Not on file   Number of children: 1   Years of education: 12   Highest education level: 12th grade  Occupational History   Occupation: disabled  Tobacco Use   Smoking status: Every Day    Packs/day: 1.00    Years: 44.00    Pack years: 44.00    Types: E-cigarettes, Cigarettes   Smokeless tobacco: Former    Types: Snuff  Vaping  Use   Vaping Use: Former   Devices: 11/26/2018 "stopped months ago"  Substance and Sexual Activity   Alcohol use: Yes    Comment: occ   Drug use: Yes    Types: "Crack" cocaine, Marijuana, Oxycodone   Sexual activity: Not Currently  Other Topics Concern   Not on file  Social History Narrative   ** Merged History Encounter **  Lives in [place], in [place] with sister.  They are looking to move but don't have a place to go yet.     Social Determinants of Health   Financial Resource Strain: Low Risk    Difficulty of Paying Living Expenses: Not very hard  Food Insecurity: Not on file  Transportation Needs: Not on file  Physical Activity: Not on file  Stress: Not on file  Social Connections: Not on file  Intimate Partner Violence: Not on file    Physical Exam      Future Appointments  Date Time Provider [place]  09/14/2021  2:45 PM Ricard Dillon, MD [facility]  10/01/2021  2:30 PM [name], MD [facility]  10/21/2021  1:40 PM Larey Dresser, MD [facility] None  11/16/2021  3:30 PM Charlott Rakes, MD [facility] None  12/22/2021  1:00 PM [facility] 3 [facility]    BP (!) 142/70   Pulse 90   Resp 20   Wt 222 lb (100.7 kg)   LMP  (LMP Unknown)   SpO2 96%   BMI 38.11 kg/m   Weight yesterday-? Last visit weight-216  Pt reports she is doing ok, still has a cough. Her weight is up from last week.  She denies increased sob, no c/p, no dizziness.  Appetite is ok.  She still has episodes of vomiting-she burps a lot and then vomits.   Meds verified and 2 wks worth of meds filled.  She is out of amio in 1 pillbox.   The nortriptyline every other day is completed-per PCP after this it needs to be decreased to 10mg . Will ask PCP to send that in.   She reports that her wound doc has prescribed more antibiotics-but I cant see any notes for that-she goes back to him next week.  She will be finished with the sulfa med on Monday morn will be  last dose of the original rx.  Advised her of the same.   Refills needed- Folic acid Amio  Will be back out next week for med rec and check in.  She did her cardiomems today. Asked amy to review it-her last reading on 10/24 was normal.  Her weight is up today from what she reported to me last week. Her cardiomems reading today was normal.     Marylouise Stacks, [place] Paramedic  09/09/21

## 2021-09-09 NOTE — Telephone Encounter (Signed)
Message received from Marylouise Stacks, EMT noting that the patient is almost done with the nortriptyline 25 mg every other day order.   Dr Margarita Rana, please provide further orders. thanks

## 2021-09-09 NOTE — Telephone Encounter (Signed)
Call placed to [facility] to check on status of O2 order. Spoke to [facility] who stated that they called the patient on 08/20/2021 and informed her that she has a 20% co-pay with her insurance. That would be $20.17/month.  She said that she did not have her credit card with her and would call them back with the credit /debit card number.   She didn't call [employer] back so they called her on 10/9 and 08/24/2021 and left messages; but she did not return their calls, so the order has been voided.

## 2021-09-12 ENCOUNTER — Emergency Department (HOSPITAL_COMMUNITY)
Admission: EM | Admit: 2021-09-12 | Discharge: 2021-09-12 | Disposition: A | Discharge status: Home / Self Care | Payer: Medicare Other | Attending: Emergency Medicine | Admitting: Emergency Medicine

## 2021-09-12 ENCOUNTER — Encounter (HOSPITAL_COMMUNITY): Payer: Self-pay | Admitting: Emergency Medicine

## 2021-09-12 ENCOUNTER — Other Ambulatory Visit: Payer: Self-pay

## 2021-09-12 ENCOUNTER — Emergency Department (HOSPITAL_COMMUNITY): Payer: Medicare Other

## 2021-09-12 DIAGNOSIS — M25511 Pain in right shoulder: Secondary | ICD-10-CM | POA: Diagnosis not present

## 2021-09-12 DIAGNOSIS — Z794 Long term (current) use of insulin: Secondary | ICD-10-CM | POA: Diagnosis not present

## 2021-09-12 DIAGNOSIS — E039 Hypothyroidism, unspecified: Secondary | ICD-10-CM | POA: Insufficient documentation

## 2021-09-12 DIAGNOSIS — S80912A Unspecified superficial injury of left knee, initial encounter: Secondary | ICD-10-CM | POA: Insufficient documentation

## 2021-09-12 DIAGNOSIS — I5043 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) and diastolic (congestive) heart failure: Secondary | ICD-10-CM | POA: Insufficient documentation

## 2021-09-12 DIAGNOSIS — W010XXA Fall on same level from slipping, tripping and stumbling without subsequent striking against object, initial encounter: Secondary | ICD-10-CM | POA: Diagnosis not present

## 2021-09-12 DIAGNOSIS — F17210 Nicotine dependence, cigarettes, uncomplicated: Secondary | ICD-10-CM | POA: Insufficient documentation

## 2021-09-12 DIAGNOSIS — Z7901 Long term (current) use of anticoagulants: Secondary | ICD-10-CM | POA: Insufficient documentation

## 2021-09-12 DIAGNOSIS — J441 Chronic obstructive pulmonary disease with (acute) exacerbation: Secondary | ICD-10-CM | POA: Diagnosis not present

## 2021-09-12 DIAGNOSIS — I2510 Atherosclerotic heart disease of native coronary artery without angina pectoris: Secondary | ICD-10-CM | POA: Diagnosis not present

## 2021-09-12 DIAGNOSIS — N183 Chronic kidney disease, stage 3 unspecified: Secondary | ICD-10-CM | POA: Insufficient documentation

## 2021-09-12 DIAGNOSIS — I480 Paroxysmal atrial fibrillation: Secondary | ICD-10-CM | POA: Diagnosis not present

## 2021-09-12 DIAGNOSIS — Z79899 Other long term (current) drug therapy: Secondary | ICD-10-CM | POA: Diagnosis not present

## 2021-09-12 DIAGNOSIS — E1122 Type 2 diabetes mellitus with diabetic chronic kidney disease: Secondary | ICD-10-CM | POA: Insufficient documentation

## 2021-09-12 DIAGNOSIS — I130 Hypertensive heart and chronic kidney disease with heart failure and stage 1 through stage 4 chronic kidney disease, or unspecified chronic kidney disease: Secondary | ICD-10-CM | POA: Diagnosis not present

## 2021-09-12 DIAGNOSIS — I110 Hypertensive heart disease with heart failure: Secondary | ICD-10-CM | POA: Diagnosis not present

## 2021-09-12 DIAGNOSIS — W19XXXA Unspecified fall, initial encounter: Secondary | ICD-10-CM

## 2021-09-12 MED ORDER — ACETAMINOPHEN 325 MG PO TABS
650.0000 mg | ORAL_TABLET | Freq: Once | ORAL | Status: AC
  Administered 2021-09-12: 650 mg via ORAL
  Filled 2021-09-12: qty 2

## 2021-09-12 NOTE — ED Triage Notes (Signed)
Pt BIBA.  Per EMS, pt coming from home with c/o mechanical fall and left knee pain. Pt reports she tripped over shoes and fell on left knee. Pt is able to bear weight and walk.  Denies LOC and any other injuries.    BP 156/70 HR 88 RR 16 95% room air.

## 2021-09-12 NOTE — ED Provider Notes (Signed)
[facility] DEPT Provider Note   CSN: [id number] Arrival date & time: 09/12/21  1054     History Chief Complaint  Patient presents with   Fall   Knee Injury    [name] is a 60 y.o. female.  With extensive past medical history presents emergency department after mechanical fall.   States that she was walking from the living room into the kitchen when she bent over to grab Tupperware and tripped and fell.  States that she landed on her left knee and hit her right shoulder.  She denies hitting her head or loss of consciousness.  She states that she was able to get up on her own and has beared weight on the left leg.  Denies fall pain because from syncope.  Denies lightheadedness or dizziness, tunnel vision prior to fall.   Fall      Past Medical History:  Diagnosis Date   Alcohol abuse    Alcoholic cirrhosis ([place])    Anemia    Anxiety    Arthritis    "knees" (11/26/2018)   B12 deficiency    CAD (coronary artery disease)    a. 11/10/2014 Cath: LM nl, LAD min irregs, D1 30 ost, D2 50d, LCX 28m, OM1 80 p/m (1.5 mm vessel), OM2 64m, RCA nondom 33m-->med rx.. Demand ischemia in the setting of rapid a-fib.   Cardiomyopathy ([place])    Carotid artery disease ([place])    a. 01/2015 Carotid Angio: RICA 568, LICA 12X; b. 03/1699 s/p L CEA; c. 05/2019 Carotid U/S: RICA 100. RECA >50. LICA 1-74%.   Cellulitis    lower extremities   CHF (congestive heart failure) ([name])    Chronic combined systolic and diastolic CHF (congestive heart failure) ([place])    a. 10/2014 Echo: EF 40-45%; b. 10/2018 Echo: EF 45-50%, gr2 DD; c. 11/2019 Echo: EF 50%, mild LVH, gr2 DD (restrictive), antlat HK, Nl RV fxn. Mild BAE. RVSP 59.74mmHg.   CKD (chronic kidney disease), stage III ([name])    Cocaine abuse ([name])    COPD (chronic obstructive pulmonary disease) ([name])    Critical lower limb ischemia ([name])    Depression    Diabetes mellitus without complication ([name])    Diabetic  peripheral neuropathy ([name])    DVT (deep venous thrombosis) ([name])    Dyspnea    Elevated troponin    a. Chronic elevation.   GERD (gastroesophageal reflux disease)    Hyperlipemia    Hypertension    Hypokalemia    Hypomagnesemia    Hypothyroidism    Marijuana abuse    Narcotic abuse ([place])    Noncompliance    NSVT (nonsustained ventricular tachycardia)    Obesity    PAF (paroxysmal atrial fibrillation) ([name])    Paroxysmal atrial tachycardia ([name])    Peripheral arterial disease ([place])    a. 01/2015 Angio/PTA: RSFA 99 (atherectomy/pta) - 1 vessel runoff via diff dzs peroneal; b. 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   Pneumonia    "once or twice" (11/26/2018)   Poorly controlled type 2 diabetes mellitus ([place])    Renal disorder    Renal insufficiency    a. 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   Sleep apnea    "couldn't handle wearing the mask" (11/26/2018)   Symptomatic bradycardia    a. Avoid AV blocking agent per EP. Prev req temp wire in 2017.   Tobacco abuse     Patient Active Problem List   Diagnosis  Date Noted   Hyperlipidemia associated with type 2 diabetes mellitus ([place]) 07/29/2021   (HFpEF) heart failure with preserved ejection fraction ([place]) 07/29/2021   Bradycardia 06/13/2021   Uncontrolled type 2 diabetes mellitus with hyperglycemia ([place]) 06/13/2021   Acute kidney injury superimposed on CKD ([place]) 06/13/2021   Critical ischemia of foot ([place]) 06/07/2021   Fall 05/05/2021   PAF (paroxysmal atrial fibrillation) ([place]) 05/05/2021   COPD (chronic obstructive pulmonary disease) ([place]) 05/05/2021   DM (diabetes mellitus), type 2 with complications ([place]) 63/14/9702   PAD (peripheral artery disease) ([place]) 05/05/2021   Cellulitis 05/05/2021   Acute on chronic combined systolic and diastolic CHF (congestive heart failure) ([place]) 05/05/2021   OSA (obstructive sleep apnea) 05/05/2021   CKD (chronic kidney disease) stage 3, GFR 30-59 ml/min ([place]) 05/05/2021   AKI (acute  kidney injury) ([place]) 05/04/2021   Pressure injury of skin 05/04/2021   Ulcer of foot, unspecified laterality, limited to breakdown of skin ([place]) 02/17/2021   Altered mental status 01/02/2021   AF (paroxysmal atrial fibrillation) ([place]) 01/02/2021   CAD (coronary artery disease) 01/02/2021   PVD (peripheral vascular disease) ([place]) 01/02/2021   Acute renal failure superimposed on stage 4 chronic kidney disease ([place]) 01/02/2021   Diabetes mellitus type 2, uncontrolled, with complications 63/78/5885   Diabetic nephropathy ([place]) 01/02/2021   Low back pain 06/01/2020   Hyperlipidemia 04/03/2020   Muscle weakness 03/20/2020   Closed fracture of neck of right radius 03/13/2020   Pain in elbow 03/12/2020   PAD (peripheral artery disease) ([place]) 12/26/2019   Acute renal failure ([name])    Acute respiratory failure with hypoxia ([place]) 12/02/2019   Acute exacerbation of CHF (congestive heart failure) ([place]) 12/01/2019   Cellulitis in diabetic foot ([place]) 07/08/2019   Osteomyelitis ([place]) 06/21/2019   NICM (nonischemic cardiomyopathy) ([place]) 06/20/2019   Non-healing ulcer ([place]) 06/20/2019   Acute CHF (congestive heart failure) ([place]) 11/26/2018   CHF (congestive heart failure) ([place]) 11/26/2018   Acute respiratory failure ([place]) 10/21/2018   AKI (acute kidney injury) ([place]) 08/18/2018   Coagulation disorder ([place]) 08/09/2017   Depression 07/21/2017   At risk for adverse drug reaction 06/20/2017   Peripheral neuropathy 06/20/2017   Acute osteomyelitis of right foot ([place]) 06/13/2017   S/P transmetatarsal amputation of foot, right ([place]) 06/05/2017   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 ([place]) 05/19/2017   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[place])    SIRS (systemic inflammatory response syndrome) ([place]) 04/06/2017   CKD (chronic kidney disease), stage III ([place]) 11/24/2016   Suspected sleep apnea 11/24/2016   Ulcer of toe of right foot, with necrosis of bone  ([place]) 10/27/2016   Ulcer of left lower leg ([place]) 05/19/2016   Severe obesity (BMI >= 40) ([place]) 02/24/2016   COPD GOLD 0  02/24/2016   Morbid obesity ([place]) 02/24/2016   Encounter for therapeutic drug monitoring 02/10/2016   Symptomatic bradycardia 01/12/2016   Hypertension associated with diabetes ([place]) 12/22/2015   Chronic combined systolic and diastolic CHF (congestive heart failure) ([name])    [place]    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 11/08/2015   Tobacco abuse 10/23/2015   Coronary artery disease    DOE (dyspnea on exertion) 04/29/2015   Diabetes mellitus type 2, uncontrolled 02/08/2015   Influenza A 02/07/2015   Wrist fracture, left, with routine healing, subsequent encounter 02/05/2015   Wrist fracture, left, closed, initial encounter 01/29/2015   PAF (paroxysmal atrial fibrillation) ([place]) 01/16/2015   Carotid arterial disease ([place]) 01/16/2015   Claudication (  [place]) 01/15/2015   Demand ischemia ([place]) 10/29/2014   Insomnia 02/03/2014   COPD with acute exacerbation ([place]) 02/01/2014   S/P peripheral artery angioplasty - TurboHawk atherectomy; R SFA 09/11/2013    Class: Acute   Leg pain, bilateral 08/19/2013   Hypothyroidism 07/31/2013   Cellulitis 06/13/2013   History of cocaine abuse ([place]) 06/13/2013   Long term current use of anticoagulant therapy 05/20/2013   Alcohol abuse    Narcotic abuse ([place])    Marijuana abuse    Alcoholic cirrhosis ([place])    DM (diabetes mellitus), type 2 with peripheral vascular complications [facility])     Past Surgical History:  Procedure Laterality Date   ABDOMINAL AORTOGRAM N/A 06/26/2019   Procedure: ABDOMINAL AORTOGRAM;  Surgeon: Wellington Hampshire, MD;  Location: [facility] LAB;  Service: Cardiovascular;  Laterality: N/A;   ABDOMINAL AORTOGRAM W/LOWER EXTREMITY N/A 06/07/2021   Procedure: ABDOMINAL AORTOGRAM W/LOWER EXTREMITY;  Surgeon: [name], MD;  Location: [facility];  Service: Cardiovascular;  Laterality: N/A;   AMPUTATION Right  06/14/2017   Procedure: Right foot transmetatarsal amputation;  Surgeon: Newt Minion, MD;  Location: [facility];  Service: Orthopedics;  Laterality: Right;   AMPUTATION Left 06/28/2019   Procedure: AMPUTATION LEFT FIFTH TOE;  Surgeon: Rosetta Posner, MD;  Location: [facility];  Service: Vascular;  Laterality: Left;   AMPUTATION TOE Right 04/28/2017   Procedure: AMPUTATION OF RIGHT SECOND RAY;  Surgeon: Newt Minion, MD;  Location: [facility];  Service: Orthopedics;  Laterality: Right;   CARDIAC CATHETERIZATION     CARDIAC CATHETERIZATION N/A 01/12/2016   Procedure: Temporary Wire;  Surgeon: Minus Breeding, MD;  Location: [facility];  Service: Cardiovascular;  Laterality: N/A;   CARDIOVERSION  ~ 02/2013   "twice"    CAROTID ANGIOGRAM N/A 01/15/2015   Procedure: CAROTID ANGIOGRAM;  Surgeon: [name], MD;  Location: [facility] CATH LAB;  Service: Cardiovascular;  Laterality: N/A;   DILATION AND CURETTAGE OF UTERUS  1988   ENDARTERECTOMY Left 02/19/2015   Procedure: LEFT CAROTID ENDARTERECTOMY ;  Surgeon: Serafina Mitchell, MD;  Location: [facility];  Service: Vascular;  Laterality: Left;   FEMORAL-TIBIAL BYPASS GRAFT Left 06/28/2019   Procedure: BYPASS GRAFT LEFT LEG FEMORAL TO ANTERIOR TIBIAL ARTERY using LEFT GREATER SAPHENOUS VEIN;  Surgeon: Rosetta Posner, MD;  Location: [facility];  Service: Vascular;  Laterality: Left;   LEFT HEART CATHETERIZATION WITH CORONARY ANGIOGRAM N/A 10/31/2014   Procedure: LEFT HEART CATHETERIZATION WITH CORONARY ANGIOGRAM;  Surgeon: Burnell Blanks, MD;  Location: [facility] CATH LAB;  Service: Cardiovascular;  Laterality: N/A;   LOWER EXTREMITY ANGIOGRAM N/A 09/10/2013   Procedure: LOWER EXTREMITY ANGIOGRAM;  Surgeon: [name], MD;  Location: [facility] CATH LAB;  Service: Cardiovascular;  Laterality: N/A;   LOWER EXTREMITY ANGIOGRAM N/A 01/15/2015   Procedure: LOWER EXTREMITY ANGIOGRAM;  Surgeon: [name], MD;  Location: [facility] CATH LAB;  Service: Cardiovascular;  Laterality: N/A;    LOWER EXTREMITY ANGIOGRAPHY N/A 04/13/2017   Procedure: Lower Extremity Angiography;  Surgeon: [name], MD;  Location: [facility];  Service: Cardiovascular;  Laterality: N/A;   LOWER EXTREMITY ANGIOGRAPHY Left 06/26/2019   Procedure: LOWER EXTREMITY ANGIOGRAPHY;  Surgeon: Wellington Hampshire, MD;  Location: [facility];  Service: Cardiovascular;  Laterality: Left;   PERIPHERAL VASCULAR ATHERECTOMY Right 06/07/2021   Procedure: PERIPHERAL VASCULAR ATHERECTOMY;  Surgeon: [name], MD;  Location: [facility];  Service: Cardiovascular;  Laterality: Right;   PERIPHERAL VASCULAR BALLOON ANGIOPLASTY Left 06/26/2019   Procedure: PERIPHERAL VASCULAR BALLOON ANGIOPLASTY;  Surgeon: Wellington Hampshire, MD;  Location: [facility];  Service: Cardiovascular;  Laterality: Left;  unable to cross lt sfa occlusion   PERIPHERAL VASCULAR INTERVENTION  04/13/2017   Procedure: Peripheral Vascular Intervention;  Surgeon: [name], MD;  Location: [facility] LAB;  Service: Cardiovascular;;   PRESSURE SENSOR/CARDIOMEMS N/A 02/05/2020   Procedure: PRESSURE SENSOR/CARDIOMEMS;  Surgeon: Larey Dresser, MD;  Location: [facility];  Service: Cardiovascular;  Laterality: N/A;   VEIN HARVEST Left 06/28/2019   Procedure: LEFT LEG GREATER SAPHENOUS VEIN HARVEST;  Surgeon: Rosetta Posner, MD;  Location: [facility];  Service: Vascular;  Laterality: Left;     OB History     Gravida  1   Para  1   Term  1   Preterm  0   AB  0   Living         SAB  0   IAB  0   Ectopic  0   Multiple      Live Births              Family History  Problem Relation Age of Onset   Hypertension Mother    Diabetes Mother    Cancer Mother        breast, ovarian, colon   Clotting disorder Mother    Heart disease Mother    Heart attack Mother    Breast cancer Mother        in 52's   Hypertension Father    Heart disease Father    Emphysema Sister        smoked     Social History   Tobacco Use   Smoking status: Every Day    Packs/day: 1.00    Years: 44.00    Pack years: 44.00    Types: E-cigarettes, Cigarettes   Smokeless tobacco: Former    Types: Snuff  Vaping Use   Vaping Use: Former   Devices: 11/26/2018 "stopped months ago"  Substance Use Topics   Alcohol use: Yes    Comment: occ   Drug use: Yes    Types: "Crack" cocaine, Marijuana, Oxycodone    Home Medications Prior to Admission medications   Medication Sig Start Date End Date Taking? Authorizing Provider  ACCU-CHEK GUIDE test strip USE TO CHECK BLOOD SUGAR FOUR TIMES DAILY 02/25/20   [provider]  acetaminophen (TYLENOL) 500 MG tablet Take 500 mg by mouth every 6 (six) hours as needed for moderate pain.    [provider]  albuterol (VENTOLIN HFA) 108 (90 Base) MCG/ACT inhaler Inhale 2 puffs into the lungs every 6 (six) hours as needed for wheezing or shortness of breath. 07/30/21   Danford, Suann Larry, MD  allopurinol (ZYLOPRIM) 100 MG tablet Take 1 tablet (100 mg total) by mouth daily. 08/12/21   Charlott Rakes, MD  amiodarone (PACERONE) 200 MG tablet Take 0.5 tablets (100 mg total) by mouth daily. 10/15/20   Larey Dresser, MD  amLODipine (NORVASC) 10 MG tablet Take 1 tablet (10 mg total) by mouth daily. 10/15/20   Larey Dresser, MD  apixaban (ELIQUIS) 5 MG TABS tablet Take 5 mg by mouth 2 (two) times daily.    [provider]  atorvastatin (LIPITOR) 40 MG tablet Take 40 mg by mouth every evening.    [provider]  benzonatate (TESSALON) 100 MG capsule Take 1  capsule (100 mg total) by mouth 2 (two) times daily as needed for cough. Patient not taking: No sig reported 08/11/21   Charlott Rakes, MD  budesonide-formoterol (SYMBICORT) 160-4.5 MCG/ACT inhaler Inhale 2 puffs into the lungs 2 (two) times daily. 12/09/19   Eugenie Filler, MD  clopidogrel (PLAVIX) 75 MG tablet TAKE 1 TABLET (75 MG TOTAL) BY MOUTH DAILY. 09/02/21   Larey Dresser, MD  colchicine 0.6 MG tablet Take 2 tabs (1.2 mg) at the onset of a gout flare, may take 1 tab (0.6 mg) in 1 hour if symptoms 08/11/21   Charlott Rakes, MD  Continuous Blood Gluc Sensor (FREESTYLE LIBRE 2 SENSOR) MISC USE 1 (ONE) EACH EVERY 2 WEEKS Patient not taking: No sig reported 07/14/21   Charlott Rakes, MD  Dulaglutide (TRULICITY) 1.5 EX/5.1ZG SOPN Inject 1.5 mg into the skin once a week. 08/11/21   Charlott Rakes, MD  empagliflozin (JARDIANCE) 10 MG TABS tablet Take 1 tablet (10 mg total) by mouth daily before breakfast. 04/01/21   Clegg, [name] D, NP  FLUoxetine (PROZAC) 20 MG capsule Take 1 capsule (20 mg total) by mouth at bedtime. 07/14/21   Charlott Rakes, MD  folic acid (FOLVITE) 1 MG tablet Take 1 tablet (1 mg total) by mouth daily. 07/14/21   Charlott Rakes, MD  hydrALAZINE (APRESOLINE) 50 MG tablet Take 1 tablet (50 mg total) by mouth 3 (three) times daily. 08/11/21   Charlott Rakes, MD  insulin glargine (LANTUS) 100 UNIT/ML Solostar Pen Inject 30 Units into the skin daily. 07/14/21 07/14/22  Charlott Rakes, MD  insulin lispro (HUMALOG) 100 UNIT/ML KwikPen Inject 8 Units into the skin 3 (three) times daily with meals. 01/05/21   Allie Bossier, MD  Insulin Pen Needle 32G X 4 MM MISC USE AS DIRECTED WITH INSULIN PENS 01/05/21 01/05/22  Allie Bossier, MD  levothyroxine (SYNTHROID, LEVOTHROID) 50 MCG tablet Take 1 tablet (50 mcg total) by mouth daily. 08/09/17   Medina-Vargas, Monina C, NP  losartan (COZAAR) 25 MG tablet Take 2 tablets (50 mg total) by mouth daily. Patient taking differently: Take 50 mg by mouth at bedtime. 08/16/21 11/14/21  Rafael Bihari, FNP  Misc. Devices MISC 2 L oxygen continuously via nasal cannula.  Diagnosis chronic respiratory failure with hypoxia Patient not taking: No sig reported 08/19/21   Charlott Rakes, MD  nortriptyline (PAMELOR) 25 MG capsule Take 1 capsule (25 mg total) by mouth at bedtime. For 2 weeks then taper down to every other day for  2-weeks Patient taking differently: Take 25 mg by mouth at bedtime. For 2 weeks then taper down to every other day for 2-weeks --placed for fri/sun/tues/thur/sat/mon/wed 08/12/21   Charlott Rakes, MD  Omega-3 Fatty Acids (FISH OIL PO) Take 1 capsule by mouth daily.    [provider]  ondansetron (ZOFRAN) 4 MG tablet Take 1 tablet (4 mg total) by mouth every 8 (eight) hours as needed for nausea or vomiting. 08/11/21   Charlott Rakes, MD  pantoprazole (PROTONIX) 40 MG tablet Take 1 tablet (40 mg total) by mouth daily. 02/21/20   Larey Dresser, MD  predniSONE (DELTASONE) 20 MG tablet Take 2 tablets (40 mg total) by mouth daily with breakfast. Patient not taking: No sig reported 08/11/21   Charlott Rakes, MD  sulfamethoxazole-trimethoprim (BACTRIM DS) 800-160 MG tablet Take 1 tablet by mouth 2 (two) times daily.    [provider]  torsemide (DEMADEX) 20 MG tablet Take 60-80 mg by mouth See admin instructions. Take  4 tablets by mouth in the morning, then take 3 tablets by mouth in the afternoon per patient    [provider]  VITAMIN D PO Take 1 tablet by mouth daily.    [provider]  tiotropium (SPIRIVA HANDIHALER) 18 MCG inhalation capsule Place 1 capsule (18 mcg total) into inhaler and inhale every morning. Patient not taking: Reported on 02/09/2021 12/09/19 02/09/21  Eugenie Filler, MD    Allergies    Gabapentin and Lyrica [pregabalin]  Review of Systems   Review of Systems  Musculoskeletal:  Positive for arthralgias and joint swelling.  Neurological:  Negative for syncope.  All other systems reviewed and are negative.  Physical Exam Updated Vital Signs LMP  (LMP Unknown)   Physical Exam Vitals and nursing note reviewed.  Constitutional:      General: She is not in acute distress.    Appearance: Normal appearance.  HENT:     Head: Normocephalic and atraumatic.  Eyes:     General: No scleral icterus. Cardiovascular:     Pulses: Normal  pulses.  Pulmonary:     Effort: Pulmonary effort is normal. No respiratory distress.  Musculoskeletal:        General: Tenderness and signs of injury present. Normal range of motion.     Right shoulder: Tenderness and bony tenderness present. No swelling or deformity. Normal range of motion. Normal pulse.     Left shoulder: Normal.       Arms:     Cervical back: Normal range of motion and neck supple. No tenderness.     Right knee: Normal.     Left knee: Swelling and ecchymosis present. No effusion or bony tenderness. Normal range of motion. Tenderness present.       Legs:     Comments: Just inferior to the left patella there is a 4 cm raised bruise and abrasion.  She has tenderness over the bruised area.  There is no bony tenderness to the patella.  There is no obvious effusion to the left knee.  Range of motion intact.  Skin:    General: Skin is warm and dry.     Capillary Refill: Capillary refill takes less than 2 seconds.     Findings: Bruising present. No rash.  Neurological:     General: No focal deficit present.     Mental Status: She is alert and oriented to person, place, and time. Mental status is at baseline.  Psychiatric:        Mood and Affect: Mood normal.        Behavior: Behavior normal.        Thought Content: Thought content normal.        Judgment: Judgment normal.    ED Results / Procedures / Treatments   Labs (all labs ordered are listed, but only abnormal results are displayed) Labs Reviewed - No data to display  EKG None  Radiology DG Shoulder Right  Result Date: 09/12/2021 CLINICAL DATA:  Trauma, fall EXAM: RIGHT SHOULDER - 2+ VIEW COMPARISON:  02/09/2021 FINDINGS: No fracture or dislocation is seen. There are no abnormal soft tissue calcifications. No significant interval changes are noted. IMPRESSION: No recent fracture or dislocation is seen in right shoulder. Electronically Signed   By: Elmer Picker M.D.   On: 09/12/2021 12:21   DG  Tibia/Fibula Left  Result Date: 09/12/2021 CLINICAL DATA:  Status post fall, pain EXAM: LEFT TIBIA AND FIBULA - 2 VIEW COMPARISON:  None. FINDINGS: No acute fracture or  dislocation. No aggressive osseous lesion. Normal alignment. Generalized osteopenia. Mild medial femorotibial compartment osteoarthritis. No joint effusion. Soft tissue are unremarkable. No radiopaque foreign body or soft tissue emphysema. Multiple surgical staples in the medial soft tissues. Peripheral vascular atherosclerotic disease. IMPRESSION: No acute osseous injury of the left tibia and fibula. Electronically Signed   By: Kathreen Devoid M.D.   On: 09/12/2021 12:22   DG Knee Complete 4 Views Left  Result Date: 09/12/2021 CLINICAL DATA:  Status post fall, pain EXAM: LEFT KNEE - COMPLETE 4+ VIEW COMPARISON:  None. FINDINGS: No acute fracture or dislocation. No aggressive osseous lesion. Normal alignment. Generalized osteopenia. Mild medial femorotibial compartment osteoarthritis. No joint effusion. Soft tissue are unremarkable. No radiopaque foreign body or soft tissue emphysema. Multiple surgical staples in the medial soft tissues. Peripheral vascular atherosclerotic disease. IMPRESSION: No acute osseous injury of the left knee. Electronically Signed   By: Kathreen Devoid M.D.   On: 09/12/2021 12:20    Procedures Procedures   Medications Ordered in ED Medications  acetaminophen (TYLENOL) tablet 650 mg (has no administration in time range)    ED Course  I have reviewed the triage vital signs and the nursing notes.  Pertinent labs & imaging results that were available during my care of the patient were reviewed by me and considered in my medical decision making (see chart for details).    MDM Rules/Calculators/A&P 60 year old female who presents emergency department after mechanical fall.  Denies hitting head or loss of consciousness.  She does have bruising over the left knee and shin. Primary complaint was left knee and  right shoulder pain.  Plain radiographs of both are negative for fracture or dislocation. She is able to ambulate without assistance here in the emergency department. Encouraged her to use ibuprofen and Tylenol, ice and rest as needed over the next few days as she recovers. Instructed to return to emergency department if she has inability to walk on her leg begins to have fevers.  Vitals are stable and she is safe for discharge. Final Clinical Impression(s) / ED Diagnoses Final diagnoses:  Fall, initial encounter    Rx / DC Orders ED Discharge Orders     None        Mickie Hillier, PA-C 09/12/21 1344    Valarie Merino, MD 09/12/21 1455

## 2021-09-12 NOTE — Discharge Instructions (Addendum)
You are seen in the emergency department today after a fall at home.  All of the x-rays that we took showed that there was no acute fractures or dislocations.  He may be sore over the next few days and it is reasonable to use ibuprofen and Tylenol as needed for pain.  You may also use ice for 15 to 20 minutes at a time.  Continue to move your arms and legs, do not keep them still as this may worsen any stiffness that you might have.  Please return emergency department if you are unable to walk on your leg or began having fever.

## 2021-09-13 ENCOUNTER — Emergency Department (HOSPITAL_COMMUNITY): Payer: Medicare Other

## 2021-09-13 ENCOUNTER — Encounter (HOSPITAL_COMMUNITY): Payer: Self-pay | Admitting: Emergency Medicine

## 2021-09-13 ENCOUNTER — Telehealth (HOSPITAL_COMMUNITY): Payer: Self-pay

## 2021-09-13 ENCOUNTER — Emergency Department (HOSPITAL_COMMUNITY)
Admission: EM | Admit: 2021-09-13 | Discharge: 2021-09-13 | Disposition: A | Discharge status: Home / Self Care | Payer: Medicare Other | Attending: Emergency Medicine | Admitting: Emergency Medicine

## 2021-09-13 ENCOUNTER — Other Ambulatory Visit: Payer: Self-pay

## 2021-09-13 ENCOUNTER — Other Ambulatory Visit (HOSPITAL_COMMUNITY): Payer: Self-pay | Admitting: Cardiology

## 2021-09-13 DIAGNOSIS — J449 Chronic obstructive pulmonary disease, unspecified: Secondary | ICD-10-CM | POA: Diagnosis not present

## 2021-09-13 DIAGNOSIS — E039 Hypothyroidism, unspecified: Secondary | ICD-10-CM | POA: Diagnosis not present

## 2021-09-13 DIAGNOSIS — S0512XA Contusion of eyeball and orbital tissues, left eye, initial encounter: Secondary | ICD-10-CM | POA: Diagnosis not present

## 2021-09-13 DIAGNOSIS — S0993XA Unspecified injury of face, initial encounter: Secondary | ICD-10-CM | POA: Diagnosis not present

## 2021-09-13 DIAGNOSIS — I480 Paroxysmal atrial fibrillation: Secondary | ICD-10-CM | POA: Diagnosis not present

## 2021-09-13 DIAGNOSIS — S0511XA Contusion of eyeball and orbital tissues, right eye, initial encounter: Secondary | ICD-10-CM | POA: Diagnosis not present

## 2021-09-13 DIAGNOSIS — I130 Hypertensive heart and chronic kidney disease with heart failure and stage 1 through stage 4 chronic kidney disease, or unspecified chronic kidney disease: Secondary | ICD-10-CM | POA: Diagnosis not present

## 2021-09-13 DIAGNOSIS — Z7951 Long term (current) use of inhaled steroids: Secondary | ICD-10-CM | POA: Insufficient documentation

## 2021-09-13 DIAGNOSIS — I5041 Acute combined systolic (congestive) and diastolic (congestive) heart failure: Secondary | ICD-10-CM | POA: Diagnosis not present

## 2021-09-13 DIAGNOSIS — R7309 Other abnormal glucose: Secondary | ICD-10-CM | POA: Insufficient documentation

## 2021-09-13 DIAGNOSIS — E1122 Type 2 diabetes mellitus with diabetic chronic kidney disease: Secondary | ICD-10-CM | POA: Insufficient documentation

## 2021-09-13 DIAGNOSIS — N183 Chronic kidney disease, stage 3 unspecified: Secondary | ICD-10-CM | POA: Insufficient documentation

## 2021-09-13 DIAGNOSIS — Z794 Long term (current) use of insulin: Secondary | ICD-10-CM | POA: Diagnosis not present

## 2021-09-13 DIAGNOSIS — I2510 Atherosclerotic heart disease of native coronary artery without angina pectoris: Secondary | ICD-10-CM | POA: Diagnosis not present

## 2021-09-13 DIAGNOSIS — W19XXXA Unspecified fall, initial encounter: Secondary | ICD-10-CM

## 2021-09-13 DIAGNOSIS — Z79899 Other long term (current) drug therapy: Secondary | ICD-10-CM | POA: Diagnosis not present

## 2021-09-13 DIAGNOSIS — F17290 Nicotine dependence, other tobacco product, uncomplicated: Secondary | ICD-10-CM | POA: Diagnosis not present

## 2021-09-13 DIAGNOSIS — S0033XA Contusion of nose, initial encounter: Secondary | ICD-10-CM | POA: Insufficient documentation

## 2021-09-13 DIAGNOSIS — Z7901 Long term (current) use of anticoagulants: Secondary | ICD-10-CM | POA: Insufficient documentation

## 2021-09-13 DIAGNOSIS — W010XXA Fall on same level from slipping, tripping and stumbling without subsequent striking against object, initial encounter: Secondary | ICD-10-CM | POA: Diagnosis not present

## 2021-09-13 DIAGNOSIS — R519 Headache, unspecified: Secondary | ICD-10-CM | POA: Insufficient documentation

## 2021-09-13 LAB — CBG MONITORING, ED: Glucose-Capillary: 214 mg/dL — ABNORMAL HIGH (ref 70–99)

## 2021-09-13 MED ORDER — ALBUTEROL SULFATE HFA 108 (90 BASE) MCG/ACT IN AERS
2.0000 | INHALATION_SPRAY | Freq: Once | RESPIRATORY_TRACT | Status: AC
  Administered 2021-09-13: 2 via RESPIRATORY_TRACT
  Filled 2021-09-13: qty 6.7

## 2021-09-13 NOTE — Telephone Encounter (Signed)
Contacted pharmacy to order the following:   Folic acid Amio-will fax [facility] for new rx    Marylouise Stacks, EMT-Paramedic  09/13/21

## 2021-09-13 NOTE — ED Provider Notes (Signed)
[facility] EMERGENCY DEPARTMENT Provider Note   CSN: [id number] Arrival date & time: 09/13/21  0016     History Chief Complaint  Patient presents with   Fall    On Plavix    [name] is a 59 y.o. female with a past medical history of alcohol abuse, cirrhosis, CAD, CHF, COPD who had a mechanical fall last night.  Patient states that she tripped and fell face first hitting her self on the nasal bridge.  She takes Eliquis.  She arrives with significant bruising over her nasal bridge and forehead along with bilateral orbital ecchymosis.  She denies loss of consciousness.  She has some neck pain.  Patient has been waiting for 10 hours in the lobby prior to my evaluation and has some wheezing because she is not had her inhaler since she got here.  HPI     Past Medical History:  Diagnosis Date   Alcohol abuse    Alcoholic cirrhosis ([place])    Anemia    Anxiety    Arthritis    "knees" (11/26/2018)   B12 deficiency    CAD (coronary artery disease)    a. 11/10/2014 Cath: LM nl, LAD min irregs, D1 30 ost, D2 50d, LCX 29m, OM1 80 p/m (1.5 mm vessel), OM2 8m, RCA nondom 34m-->med rx.. Demand ischemia in the setting of rapid a-fib.   Cardiomyopathy ([place])    Carotid artery disease ([place])    a. 01/2015 Carotid Angio: RICA 478, LICA 29F; b. 04/2129 s/p L CEA; c. 05/2019 Carotid U/S: RICA 100. RECA >50. LICA 8-65%.   Cellulitis    lower extremities   CHF (congestive heart failure) ([name])    Chronic combined systolic and diastolic CHF (congestive heart failure) ([place])    a. 10/2014 Echo: EF 40-45%; b. 10/2018 Echo: EF 45-50%, gr2 DD; c. 11/2019 Echo: EF 50%, mild LVH, gr2 DD (restrictive), antlat HK, Nl RV fxn. Mild BAE. RVSP 59.22mmHg.   CKD (chronic kidney disease), stage III ([name])    Cocaine abuse ([name])    COPD (chronic obstructive pulmonary disease) ([name])    Critical lower limb ischemia ([name])    Depression    Diabetes mellitus without complication ([name])    Diabetic  peripheral neuropathy ([name])    DVT (deep venous thrombosis) ([name])    Dyspnea    Elevated troponin    a. Chronic elevation.   GERD (gastroesophageal reflux disease)    Hyperlipemia    Hypertension    Hypokalemia    Hypomagnesemia    Hypothyroidism    Marijuana abuse    Narcotic abuse ([place])    Noncompliance    NSVT (nonsustained ventricular tachycardia)    Obesity    PAF (paroxysmal atrial fibrillation) ([name])    Paroxysmal atrial tachycardia ([name])    Peripheral arterial disease ([place])    a. 01/2015 Angio/PTA: RSFA 99 (atherectomy/pta) - 1 vessel runoff via diff dzs peroneal; b. 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   Pneumonia    "once or twice" (11/26/2018)   Poorly controlled type 2 diabetes mellitus ([place])    Renal disorder    Renal insufficiency    a. 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   Sleep apnea    "couldn't handle wearing the mask" (11/26/2018)   Symptomatic bradycardia    a. Avoid AV blocking agent per EP. Prev req temp wire in 2017.   Tobacco abuse     Patient Active Problem List   Diagnosis  Date Noted   Hyperlipidemia associated with type 2 diabetes mellitus ([place]) 07/29/2021   (HFpEF) heart failure with preserved ejection fraction ([place]) 07/29/2021   Bradycardia 06/13/2021   Uncontrolled type 2 diabetes mellitus with hyperglycemia ([place]) 06/13/2021   Acute kidney injury superimposed on CKD ([place]) 06/13/2021   Critical ischemia of foot ([place]) 06/07/2021   Fall 05/05/2021   PAF (paroxysmal atrial fibrillation) ([place]) 05/05/2021   COPD (chronic obstructive pulmonary disease) ([place]) 05/05/2021   DM (diabetes mellitus), type 2 with complications ([place]) 81/85/6314   PAD (peripheral artery disease) ([place]) 05/05/2021   Cellulitis 05/05/2021   Acute on chronic combined systolic and diastolic CHF (congestive heart failure) ([place]) 05/05/2021   OSA (obstructive sleep apnea) 05/05/2021   CKD (chronic kidney disease) stage 3, GFR 30-59 ml/min ([place]) 05/05/2021   AKI (acute  kidney injury) ([place]) 05/04/2021   Pressure injury of skin 05/04/2021   Ulcer of foot, unspecified laterality, limited to breakdown of skin ([place]) 02/17/2021   Altered mental status 01/02/2021   AF (paroxysmal atrial fibrillation) ([place]) 01/02/2021   CAD (coronary artery disease) 01/02/2021   PVD (peripheral vascular disease) ([place]) 01/02/2021   Acute renal failure superimposed on stage 4 chronic kidney disease ([place]) 01/02/2021   Diabetes mellitus type 2, uncontrolled, with complications 97/12/6376   Diabetic nephropathy ([place]) 01/02/2021   Low back pain 06/01/2020   Hyperlipidemia 04/03/2020   Muscle weakness 03/20/2020   Closed fracture of neck of right radius 03/13/2020   Pain in elbow 03/12/2020   PAD (peripheral artery disease) ([place]) 12/26/2019   Acute renal failure ([name])    Acute respiratory failure with hypoxia ([place]) 12/02/2019   Acute exacerbation of CHF (congestive heart failure) ([place]) 12/01/2019   Cellulitis in diabetic foot ([place]) 07/08/2019   Osteomyelitis ([place]) 06/21/2019   NICM (nonischemic cardiomyopathy) ([place]) 06/20/2019   Non-healing ulcer ([place]) 06/20/2019   Acute CHF (congestive heart failure) ([place]) 11/26/2018   CHF (congestive heart failure) ([place]) 11/26/2018   Acute respiratory failure ([place]) 10/21/2018   AKI (acute kidney injury) ([place]) 08/18/2018   Coagulation disorder ([place]) 08/09/2017   Depression 07/21/2017   At risk for adverse drug reaction 06/20/2017   Peripheral neuropathy 06/20/2017   Acute osteomyelitis of right foot ([place]) 06/13/2017   S/P transmetatarsal amputation of foot, right ([place]) 06/05/2017   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 ([place]) 05/19/2017   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[place])    SIRS (systemic inflammatory response syndrome) ([place]) 04/06/2017   CKD (chronic kidney disease), stage III ([place]) 11/24/2016   Suspected sleep apnea 11/24/2016   Ulcer of toe of right foot, with necrosis of bone  ([place]) 10/27/2016   Ulcer of left lower leg ([place]) 05/19/2016   Severe obesity (BMI >= 40) ([place]) 02/24/2016   COPD GOLD 0  02/24/2016   Morbid obesity ([place]) 02/24/2016   Encounter for therapeutic drug monitoring 02/10/2016   Symptomatic bradycardia 01/12/2016   Hypertension associated with diabetes ([place]) 12/22/2015   Chronic combined systolic and diastolic CHF (congestive heart failure) ([name])    [place]    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 11/08/2015   Tobacco abuse 10/23/2015   Coronary artery disease    DOE (dyspnea on exertion) 04/29/2015   Diabetes mellitus type 2, uncontrolled 02/08/2015   Influenza A 02/07/2015   Wrist fracture, left, with routine healing, subsequent encounter 02/05/2015   Wrist fracture, left, closed, initial encounter 01/29/2015   PAF (paroxysmal atrial fibrillation) ([place]) 01/16/2015   Carotid arterial disease ([place]) 01/16/2015   Claudication (  [place]) 01/15/2015   Demand ischemia ([place]) 10/29/2014   Insomnia 02/03/2014   COPD with acute exacerbation ([place]) 02/01/2014   S/P peripheral artery angioplasty - [place]bo[place] atherectomy; R SFA 09/11/2013    Class: Acute   Leg pain, bilateral 08/19/2013   Hypothyroidism 07/31/2013   Cellulitis 06/13/2013   History of cocaine abuse ([place]) 06/13/2013   Long term current use of anticoagulant therapy 05/20/2013   Alcohol abuse    Narcotic abuse ([place])    Marijuana abuse    Alcoholic cirrhosis ([place])    DM (diabetes mellitus), type 2 with peripheral vascular complications [facility])     Past Surgical History:  Procedure Laterality Date   ABDOMINAL AORTOGRAM N/A 06/26/2019   Procedure: ABDOMINAL AORTOGRAM;  Surgeon: Wellington Hampshire, MD;  Location: [place] [facility];  Service: Cardiovascular;  Laterality: N/A;   ABDOMINAL AORTOGRAM W/LOWER EXTREMITY N/A 06/07/2021   Procedure: ABDOMINAL AORTOGRAM W/LOWER EXTREMITY;  Surgeon: [name], MD;  Location: [place] [facility];  Service: Cardiovascular;  Laterality: N/A;   AMPUTATION Right  06/14/2017   Procedure: Right foot transmetatarsal amputation;  Surgeon: Newt Minion, MD;  Location: [place];  Service: Orthopedics;  Laterality: Right;   AMPUTATION Left 06/28/2019   Procedure: AMPUTATION LEFT FIFTH TOE;  Surgeon: Rosetta Posner, MD;  Location: [place];  Service: Vascular;  Laterality: Left;   AMPUTATION TOE Right 04/28/2017   Procedure: AMPUTATION OF RIGHT SECOND RAY;  Surgeon: Newt Minion, MD;  Location: [place];  Service: Orthopedics;  Laterality: Right;   CARDIAC CATHETERIZATION     CARDIAC CATHETERIZATION N/A 01/12/2016   Procedure: Temporary Wire;  Surgeon: Minus Breeding, MD;  Location: [place] [facility];  Service: Cardiovascular;  Laterality: N/A;   CARDIOVERSION  ~ 02/2013   "twice"    CAROTID ANGIOGRAM N/A 01/15/2015   Procedure: CAROTID ANGIOGRAM;  Surgeon: [name], MD;  Location: [facility] LAB;  Service: Cardiovascular;  Laterality: N/A;   DILATION AND CURETTAGE OF UTERUS  1988   ENDARTERECTOMY Left 02/19/2015   Procedure: LEFT CAROTID ENDARTERECTOMY ;  Surgeon: Serafina Mitchell, MD;  Location: [place];  Service: Vascular;  Laterality: Left;   FEMORAL-TIBIAL BYPASS GRAFT Left 06/28/2019   Procedure: BYPASS GRAFT LEFT LEG FEMORAL TO ANTERIOR TIBIAL ARTERY using LEFT GREATER SAPHENOUS VEIN;  Surgeon: Rosetta Posner, MD;  Location: [place];  Service: Vascular;  Laterality: Left;   LEFT HEART CATHETERIZATION WITH CORONARY ANGIOGRAM N/A 10/31/2014   Procedure: LEFT HEART CATHETERIZATION WITH CORONARY ANGIOGRAM;  Surgeon: Burnell Blanks, MD;  Location[facility] C[facility] LAB;  Service: Cardiovascular;  Laterality: N/A;   LOWER EXTREMITY ANGIOGRAM N/A 09/10/2013   Procedure: LOWER EXTREMITY ANGIOGRAM;  Surgeon: [name], MD;  Location: [facility] CATH LAB;  Service: Cardiovascular;  Laterality: N/A;   LOWER EXTREMITY ANGIOGRAM N/A 01/15/2015   Procedure: LOWER EXTREMITY ANGIOGRAM;  Surgeon: [name], MD;  Location: [facility] C[facility] LAB;  Service: Cardiovascular;  Laterality: N/A;    LOWER EXTREMITY ANGIOGRAPHY N/A 04/13/2017   Procedure: Lower Extremity Angiography;  Surgeon: [name], MD;  Location: [place] [facility];  Service: Cardiovascular;  Laterality: N/A;   LOWER EXTREMITY ANGIOGRAPHY Left 06/26/2019   Procedure: LOWER EXTREMITY ANGIOGRAPHY;  Surgeon: Wellington Hampshire, MD;  Location: [place] [facility];  Service: Cardiovascular;  Laterality: Left;   PERIPHERAL VASCULAR ATHERECTOMY Right 06/07/2021   Procedure: PERIPHERAL VASCULAR ATHERECTOMY;  Surgeon: [name], MD;  Location: [place] [facility];  Service: Cardiovascular;  Laterality: Right;   PERIPHERAL VASCULAR BALLOON ANGIOPLASTY Left 06/26/2019   Procedure: PERIPHERAL VASCULAR BALLOON ANGIOPLASTY;  Surgeon: Wellington Hampshire, MD;  Location: [place] [facility];  Service: Cardiovascular;  Laterality: Left;  unable to cross lt sfa occlusion   PERIPHERAL VASCULAR INTERVENTION  04/13/2017   Procedure: Peripheral Vascular Intervention;  Surgeon: [name], MD;  Location: [place] [facility];  Service: Cardiovascular;;   PRESSURE SENSOR/CARDIOMEMS N/A 02/05/2020   Procedure: PRESSURE SENSOR/CARDIOMEMS;  Surgeon: Larey Dresser, MD;  Location: [place] [facility];  Service: Cardiovascular;  Laterality: N/A;   VEIN HARVEST Left 06/28/2019   Procedure: LEFT LEG GREATER SAPHENOUS VEIN HARVEST;  Surgeon: Rosetta Posner, MD;  Location: [facility];  Service: Vascular;  Laterality: Left;     OB History     Gravida  1   Para  1   Term  1   Preterm  0   AB  0   Living         SAB  0   IAB  0   Ectopic  0   Multiple      Live Births              Family History  Problem Relation Age of Onset   Hypertension Mother    Diabetes Mother    Cancer Mother        breast, ovarian, colon   Clotting disorder Mother    Heart disease Mother    Heart attack Mother    Breast cancer Mother        in 64's   Hypertension Father    Heart disease Father    Emphysema Sister        smoked     Social History   Tobacco Use   Smoking status: Every Day    Packs/day: 1.00    Years: 44.00    Pack years: 44.00    Types: E-cigarettes, Cigarettes   Smokeless tobacco: Former    Types: Snuff  Vaping Use   Vaping Use: Former   Devices: 11/26/2018 "stopped months ago"  Substance Use Topics   Alcohol use: Yes    Comment: occ   Drug use: Yes    Types: "Crack" cocaine, Marijuana, Oxycodone    Home Medications Prior to Admission medications   Medication Sig Start Date End Date Taking? Authorizing Provider  ACCU-CHEK GUIDE test strip USE TO CHECK BLOOD SUGAR FOUR TIMES DAILY 02/25/20   [provider]  acetaminophen (TYLENOL) 500 MG tablet Take 500 mg by mouth every 6 (six) hours as needed for moderate pain.    [provider]  albuterol (VENTOLIN HFA) 108 (90 Base) MCG/ACT inhaler Inhale 2 puffs into the lungs every 6 (six) hours as needed for wheezing or shortness of breath. 07/30/21   Danford, Suann Larry, MD  allopurinol (ZYLOPRIM) 100 MG tablet Take 1 tablet (100 mg total) by mouth daily. 08/12/21   Charlott Rakes, MD  amiodarone (PACERONE) 200 MG tablet Take 0.5 tablets (100 mg total) by mouth daily. 10/15/20   Larey Dresser, MD  amLODipine (NORVASC) 10 MG tablet Take 1 tablet (10 mg total) by mouth daily. 10/15/20   Larey Dresser, MD  apixaban (ELIQUIS) 5 MG TABS tablet Take 5 mg by mouth 2 (two) times daily.    [provider]  atorvastatin (LIPITOR) 40 MG tablet Take 40 mg by mouth every evening.    [provider]  benzonatate (TESSALON) 100 MG capsule Take 1  capsule (100 mg total) by mouth 2 (two) times daily as needed for cough. Patient not taking: No sig reported 08/11/21   Charlott Rakes, MD  budesonide-formoterol (SYMBICORT) 160-4.5 MCG/ACT inhaler Inhale 2 puffs into the lungs 2 (two) times daily. 12/09/19   Eugenie Filler, MD  clopidogrel (PLAVIX) 75 MG tablet TAKE 1 TABLET (75 MG TOTAL) BY MOUTH DAILY. 09/02/21   Larey Dresser, MD  colchicine 0.6 MG tablet Take 2 tabs (1.2 mg) at the onset of a gout flare, may take 1 tab (0.6 mg) in 1 hour if symptoms 08/11/21   Charlott Rakes, MD  Continuous Blood Gluc Sensor (FREESTYLE LIBRE 2 SENSOR) MISC USE 1 (ONE) EACH EVERY 2 WEEKS Patient not taking: No sig reported 07/14/21   Charlott Rakes, MD  Dulaglutide (TRULICITY) 1.5 AV/4.0JW SOPN Inject 1.5 mg into the skin once a week. 08/11/21   Charlott Rakes, MD  empagliflozin (JARDIANCE) 10 MG TABS tablet Take 1 tablet (10 mg total) by mouth daily before breakfast. 04/01/21   Clegg, [name] D, NP  FLUoxetine (PROZAC) 20 MG capsule Take 1 capsule (20 mg total) by mouth at bedtime. 07/14/21   Charlott Rakes, MD  folic acid (FOLVITE) 1 MG tablet Take 1 tablet (1 mg total) by mouth daily. 07/14/21   Charlott Rakes, MD  hydrALAZINE (APRESOLINE) 50 MG tablet Take 1 tablet (50 mg total) by mouth 3 (three) times daily. 08/11/21   Charlott Rakes, MD  insulin glargine (LANTUS) 100 UNIT/ML Solostar Pen Inject 30 Units into the skin daily. 07/14/21 07/14/22  Charlott Rakes, MD  insulin lispro (HUMALOG) 100 UNIT/ML KwikPen Inject 8 Units into the skin 3 (three) times daily with meals. 01/05/21   Allie Bossier, MD  Insulin Pen Needle 32G X 4 MM MISC USE AS DIRECTED WITH INSULIN PENS 01/05/21 01/05/22  Allie Bossier, MD  levothyroxine (SYNTHROID, LEVOTHROID) 50 MCG tablet Take 1 tablet (50 mcg total) by mouth daily. 08/09/17   Medina-Vargas, Monina C, NP  losartan (COZAAR) 25 MG tablet Take 2 tablets (50 mg total) by mouth daily. Patient taking differently: Take 50 mg by mouth at bedtime. 08/16/21 11/14/21  Rafael Bihari, FNP  Misc. Devices MISC 2 L oxygen continuously via nasal cannula.  Diagnosis chronic respiratory failure with hypoxia Patient not taking: No sig reported 08/19/21   Charlott Rakes, MD  nortriptyline (PAMELOR) 25 MG capsule Take 1 capsule (25 mg total) by mouth at bedtime. For 2 weeks then taper down to every other day for  2-weeks Patient taking differently: Take 25 mg by mouth at bedtime. For 2 weeks then taper down to every other day for 2-weeks --placed for fri/sun/tues/thur/sat/mon/wed 08/12/21   Charlott Rakes, MD  Omega-3 Fatty Acids (FISH OIL PO) Take 1 capsule by mouth daily.    [provider]  ondansetron (ZOFRAN) 4 MG tablet Take 1 tablet (4 mg total) by mouth every 8 (eight) hours as needed for nausea or vomiting. 08/11/21   Charlott Rakes, MD  pantoprazole (PROTONIX) 40 MG tablet Take 1 tablet (40 mg total) by mouth daily. 02/21/20   Larey Dresser, MD  predniSONE (DELTASONE) 20 MG tablet Take 2 tablets (40 mg total) by mouth daily with breakfast. Patient not taking: No sig reported 08/11/21   Charlott Rakes, MD  sulfamethoxazole-trimethoprim (BACTRIM DS) 800-160 MG tablet Take 1 tablet by mouth 2 (two) times daily.    [provider]  torsemide (DEMADEX) 20 MG tablet Take 60-80 mg by mouth See admin instructions. Take  4 tablets by mouth in the morning, then take 3 tablets by mouth in the afternoon per patient    [provider]  VITAMIN D PO Take 1 tablet by mouth daily.    [provider]  tiotropium (SPIRIVA HANDIHALER) 18 MCG inhalation capsule Place 1 capsule (18 mcg total) into inhaler and inhale every morning. Patient not taking: Reported on 02/09/2021 12/09/19 02/09/21  Eugenie Filler, MD    Allergies    Gabapentin and Lyrica [pregabalin]  Review of Systems   Review of Systems Ten systems reviewed and are negative for acute change, except as noted in the HPI.   Physical Exam Updated Vital Signs BP (!) 149/112 (BP Location: Left Arm)   Pulse 95   Temp 98.3 F (36.8 C) (Oral)   Resp 20   Ht 5\' 4"  (1.626 m)   Wt 112 kg   LMP  (LMP Unknown)   SpO2 95%   BMI 42.38 kg/m   Physical Exam Vitals and nursing note reviewed.  Constitutional:      General: She is not in acute distress.    Appearance: She is well-developed. She is not diaphoretic.   HENT:     Head: Normocephalic and atraumatic.     Right Ear: Tympanic membrane and external ear normal.     Left Ear: Tympanic membrane and external ear normal.     Nose: Nose normal.     Mouth/Throat:     Mouth: Mucous membranes are moist.  Eyes:     General: No scleral icterus.    Extraocular Movements: Extraocular movements intact.     Conjunctiva/sclera: Conjunctivae normal.     Pupils: Pupils are equal, round, and reactive to light.     Comments: Bruisin around both eye orbits. R eye subconjunctival hemorrhage.  Bruising across the forehead and nasal bridge. Moves eyes without pain.vision grossly intact.   Cardiovascular:     Rate and Rhythm: Normal rate and regular rhythm.     Heart sounds: Normal heart sounds. No murmur heard.   No friction rub. No gallop.  Pulmonary:     Effort: Pulmonary effort is normal. No respiratory distress.     Breath sounds: Normal breath sounds.  Abdominal:     General: Bowel sounds are normal. There is no distension.     Palpations: Abdomen is soft. There is no mass.     Tenderness: There is no abdominal tenderness. There is no guarding.  Musculoskeletal:     Cervical back: Normal range of motion.  Skin:    General: Skin is warm and dry.  Neurological:     Mental Status: She is alert and oriented to person, place, and time.  Psychiatric:        Behavior: Behavior normal.    ED Results / Procedures / Treatments   Labs (all labs ordered are listed, but only abnormal results are displayed) Labs Reviewed  CBG MONITORING, ED - Abnormal; Notable for the following components:      Result Value   Glucose-Capillary 214 (*)    All other components within normal limits    EKG None  Radiology DG Shoulder Right  Result Date: 09/12/2021 CLINICAL DATA:  Trauma, fall EXAM: RIGHT SHOULDER - 2+ VIEW COMPARISON:  02/09/2021 FINDINGS: No fracture or dislocation is seen. There are no abnormal soft tissue calcifications. No significant interval  changes are noted. IMPRESSION: No recent fracture or dislocation is seen in right shoulder. Electronically Signed   By: Prudy Feeler.D.  On: 09/12/2021 12:21   DG Tibia/Fibula Left  Result Date: 09/12/2021 CLINICAL DATA:  Status post fall, pain EXAM: LEFT TIBIA AND FIBULA - 2 VIEW COMPARISON:  None. FINDINGS: No acute fracture or dislocation. No aggressive osseous lesion. Normal alignment. Generalized osteopenia. Mild medial femorotibial compartment osteoarthritis. No joint effusion. Soft tissue are unremarkable. No radiopaque foreign body or soft tissue emphysema. Multiple surgical staples in the medial soft tissues. Peripheral vascular atherosclerotic disease. IMPRESSION: No acute osseous injury of the left tibia and fibula. Electronically Signed   By: Kathreen Devoid M.D.   On: 09/12/2021 12:22   CT HEAD WO CONTRAST (5MM)  Result Date: 09/13/2021 CLINICAL DATA:  Fall, headache, facial trauma, neck pain EXAM: CT HEAD WITHOUT CONTRAST CT MAXILLOFACIAL WITHOUT CONTRAST CT CERVICAL SPINE WITHOUT CONTRAST TECHNIQUE: Multidetector CT imaging of the head, cervical spine, and maxillofacial structures were performed using the standard protocol without intravenous contrast. Multiplanar CT image reconstructions of the cervical spine and maxillofacial structures were also generated. COMPARISON:  CT head dated 01/01/2021 FINDINGS: CT HEAD FINDINGS Brain: No evidence of acute infarction, hemorrhage, hydrocephalus, extra-axial collection or mass lesion/mass effect. 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 Mild subcortical white matter and periventricular small vessel ischemic changes. Vascular: Intracranial atherosclerosis. Skull: Normal. Negative for fracture or focal lesion. Other: The visualized paranasal sinuses are essentially clear. The mastoid air cells are unopacified. CT MAXILLOFACIAL FINDINGS Osseous: No evidence of maxillofacial fracture. Mandible is intact. Bilateral mandibular condyles are well-seated  in the TMJs. Orbits: Bilateral orbits, including the globes and retroconal soft tissues, are within normal limits. Sinuses: The visualized paranasal sinuses are essentially clear. The mastoid air cells are unopacified. Soft tissues: Negative. CT CERVICAL SPINE FINDINGS Alignment: Normal cervical lordosis. Skull base and vertebrae: No acute fracture. No primary bone lesion or focal pathologic process. Soft tissues and spinal canal: No prevertebral fluid or swelling. No visible canal hematoma. Disc levels: Mild degenerative changes of the lower cervical spine. Spinal canal is patent. Upper chest: Visualized lung apices are clear. Other: Visualized thyroid is unremarkable. IMPRESSION: No evidence of acute intracranial abnormality. 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 Mild small vessel ischemic changes. No evidence of maxillofacial fracture. No evidence of traumatic injury to the cervical spine. Mild degenerative changes. Electronically Signed   By: Julian Hy M.D.   On: 09/13/2021 01:56   CT Cervical Spine Wo Contrast  Result Date: 09/13/2021 CLINICAL DATA:  Fall, headache, facial trauma, neck pain EXAM: CT HEAD WITHOUT CONTRAST CT MAXILLOFACIAL WITHOUT CONTRAST CT CERVICAL SPINE WITHOUT CONTRAST TECHNIQUE: Multidetector CT imaging of the head, cervical spine, and maxillofacial structures were performed using the standard protocol without intravenous contrast. Multiplanar CT image reconstructions of the cervical spine and maxillofacial structures were also generated. COMPARISON:  CT head dated 01/01/2021 FINDINGS: CT HEAD FINDINGS Brain: No evidence of acute infarction, hemorrhage, hydrocephalus, extra-axial collection or mass lesion/mass effect. 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 Mild subcortical white matter and periventricular small vessel ischemic changes. Vascular: Intracranial atherosclerosis. Skull: Normal. Negative for fracture or focal lesion. Other: The visualized paranasal sinuses are essentially  clear. The mastoid air cells are unopacified. CT MAXILLOFACIAL FINDINGS Osseous: No evidence of maxillofacial fracture. Mandible is intact. Bilateral mandibular condyles are well-seated in the TMJs. Orbits: Bilateral orbits, including the globes and retroconal soft tissues, are within normal limits. Sinuses: The visualized paranasal sinuses are essentially clear. The mastoid air cells are unopacified. Soft tissues: Negative. CT CERVICAL SPINE FINDINGS Alignment: Normal cervical lordosis. Skull base and vertebrae: No acute fracture. No primary bone  lesion or focal pathologic process. Soft tissues and spinal canal: No prevertebral fluid or swelling. No visible canal hematoma. Disc levels: Mild degenerative changes of the lower cervical spine. Spinal canal is patent. Upper chest: Visualized lung apices are clear. Other: Visualized thyroid is unremarkable. IMPRESSION: No evidence of acute intracranial abnormality. 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 Mild small vessel ischemic changes. No evidence of maxillofacial fracture. No evidence of traumatic injury to the cervical spine. Mild degenerative changes. Electronically Signed   By: Julian Hy M.D.   On: 09/13/2021 01:56   DG Knee Complete 4 Views Left  Result Date: 09/12/2021 CLINICAL DATA:  Status post fall, pain EXAM: LEFT KNEE - COMPLETE 4+ VIEW COMPARISON:  None. FINDINGS: No acute fracture or dislocation. No aggressive osseous lesion. Normal alignment. Generalized osteopenia. Mild medial femorotibial compartment osteoarthritis. No joint effusion. Soft tissue are unremarkable. No radiopaque foreign body or soft tissue emphysema. Multiple surgical staples in the medial soft tissues. Peripheral vascular atherosclerotic disease. IMPRESSION: No acute osseous injury of the left knee. Electronically Signed   By: Kathreen Devoid M.D.   On: 09/12/2021 12:20   CT Maxillofacial Wo Contrast  Result Date: 09/13/2021 CLINICAL DATA:  Fall, headache, facial trauma,  neck pain EXAM: CT HEAD WITHOUT CONTRAST CT MAXILLOFACIAL WITHOUT CONTRAST CT CERVICAL SPINE WITHOUT CONTRAST TECHNIQUE: Multidetector CT imaging of the head, cervical spine, and maxillofacial structures were performed using the standard protocol without intravenous contrast. Multiplanar CT image reconstructions of the cervical spine and maxillofacial structures were also generated. COMPARISON:  CT head dated 01/01/2021 FINDINGS: CT HEAD FINDINGS Brain: No evidence of acute infarction, hemorrhage, hydrocephalus, extra-axial collection or mass lesion/mass effect. 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 Mild subcortical white matter and periventricular small vessel ischemic changes. Vascular: Intracranial atherosclerosis. Skull: Normal. Negative for fracture or focal lesion. Other: The visualized paranasal sinuses are essentially clear. The mastoid air cells are unopacified. CT MAXILLOFACIAL FINDINGS Osseous: No evidence of maxillofacial fracture. Mandible is intact. Bilateral mandibular condyles are well-seated in the TMJs. Orbits: Bilateral orbits, including the globes and retroconal soft tissues, are within normal limits. Sinuses: The visualized paranasal sinuses are essentially clear. The mastoid air cells are unopacified. Soft tissues: Negative. CT CERVICAL SPINE FINDINGS Alignment: Normal cervical lordosis. Skull base and vertebrae: No acute fracture. No primary bone lesion or focal pathologic process. Soft tissues and spinal canal: No prevertebral fluid or swelling. No visible canal hematoma. Disc levels: Mild degenerative changes of the lower cervical spine. Spinal canal is patent. Upper chest: Visualized lung apices are clear. Other: Visualized thyroid is unremarkable. IMPRESSION: No evidence of acute intracranial abnormality. 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 Mild small vessel ischemic changes. No evidence of maxillofacial fracture. No evidence of traumatic injury to the cervical spine. Mild degenerative  changes. Electronically Signed   By: Julian Hy M.D.   On: 09/13/2021 01:56    Procedures Procedures   Medications Ordered in ED Medications - No data to display  ED Course  I have reviewed the triage vital signs and the nursing notes.  Pertinent labs & imaging results that were available during my care of the patient were reviewed by me and considered in my medical decision making (see chart for details).    MDM Rules/Calculators/A&P                         Patient here with a fall and facial trauma. I reviewed labs, CBG which was ordered earlier this morning and shows CBG of  214.  I reviewed images including CT head, C-spine and maxillofacial all of which showed no acute abnormalities.  Patient is wheezing and she was given an inhaler for treatment of her wheezing.  She appears otherwise appropriate for discharge at this time with return precautions.  Final Clinical Impression(s) / ED Diagnoses Final diagnoses:  None    Rx / DC Orders ED Discharge Orders     None        Margarita Mail, PA-C 09/13/21 [place], [place], DO 09/13/21 1546

## 2021-09-13 NOTE — ED Triage Notes (Signed)
Patient arrived with EMS from home tripped and fell this evening , reports posterior neck pain , headache with left eye bruise and right eye sclera redness. NO LOC . She takes Plavix.

## 2021-09-13 NOTE — ED Provider Notes (Signed)
Emergency Medicine Provider Triage Evaluation Note  [name] , a 60 y.o. female  was evaluated in triage.  Pt complains of mechanical fall approx 1 hour PTA while going out to smoke a cigarette.  Pt reports she tripped and fell face first.  No LOC.  Pt does take a blood thinner for a-fib.  No CP or SOB.  [name]  Review of Systems  Positive: Facial swelling, headache Negative: Numbness, weakness, loss of bowel or bladder  Physical Exam  BP 125/68   Pulse 95   Temp 98.3 F (36.8 C) (Oral)   Resp 16   Ht 5\' 4"  (1.626 m)   Wt 112 kg   LMP  (LMP Unknown)   SpO2 97%   BMI 42.38 kg/m  Gen:   Awake, no distress   Resp:  Normal effort  MSK:   Moves extremities without difficulty  Other:  Abrasions to the face, sunconjunctival hematoma - right eye, PERRL  Medical Decision Making  Medically screening exam initiated at 12:34 AM.  Appropriate orders placed.  [name] was informed that the remainder of the evaluation will be completed by another provider, this initial triage assessment does not replace that evaluation, and the importance of remaining in the ED until their evaluation is complete.  Fall on thinners. Ct scans ordered.   [name], Gwenlyn Perking 09/13/21 0036    Orpah Greek, MD 09/13/21 [phone]

## 2021-09-13 NOTE — Discharge Instructions (Signed)
Contact a health care provider if: You have trouble biting or chewing. Your pain or swelling gets worse. The discolored area gets much worse. Get help right away if: You have severe pain or a headache that is not relieved by medicine. You have unusual sleepiness, confusion, or personality changes. You vomit. You have a nosebleed that does not stop. You have double vision or blurred vision. You have clear fluid draining from your nose or ear, and it does not go away. You have trouble walking or using your arms or legs. You have severe dizziness.

## 2021-09-14 ENCOUNTER — Encounter (HOSPITAL_BASED_OUTPATIENT_CLINIC_OR_DEPARTMENT_OTHER): Payer: Medicare Other | Attending: Internal Medicine | Admitting: Internal Medicine

## 2021-09-15 ENCOUNTER — Telehealth: Payer: Self-pay

## 2021-09-15 ENCOUNTER — Telehealth: Payer: Self-pay | Admitting: *Deleted

## 2021-09-15 MED ORDER — NORTRIPTYLINE HCL 10 MG PO CAPS: ORAL_CAPSULE | ORAL | 0 refills | Status: DC

## 2021-09-15 NOTE — Telephone Encounter (Signed)
I have sent a new prescription to the pharmacy for 10 mg with a taperred dose.  Thanks

## 2021-09-15 NOTE — Telephone Encounter (Signed)
Copied from [place] [id number]. Topic: General - Call Back - No Documentation >> Sep 07, 2021  5:47 PM Erick Blinks wrote: Delfino Lovett calling from [facility] called inquiring about a Fax that was submitted 09/03/2021. Please advise  Best contact: [phone] >> Sep 13, 2021  5:27 PM Erick Blinks wrote: Call back request, Delfino Lovett called to check status

## 2021-09-15 NOTE — Telephone Encounter (Signed)
Dr Margarita Rana - Per Marylouise Stacks, EMT, the patient is taking nortriptyline 25 mg every other day.  Do you want to taper her dose?  Thank

## 2021-09-16 ENCOUNTER — Telehealth (HOSPITAL_COMMUNITY): Payer: Self-pay

## 2021-09-16 DIAGNOSIS — H34832 Tributary (branch) retinal vein occlusion, left eye, with macular edema: Secondary | ICD-10-CM | POA: Diagnosis not present

## 2021-09-16 IMAGING — CT CT HEAD W/O CM
4 series · 17 of 47 positions shown, 19 images · non-contrast
Comparison: Head CT 09/29/2018.

CLINICAL DATA: Psychotic symptoms for 3 days.

EXAM:
CT HEAD WITHOUT CONTRAST
TECHNIQUE: Contiguous axial images were obtained from the base of the skull
through the vertex without intravenous contrast.

[Series 3: head without · axial · non-contrast · 0.42mm/px · z∈[+1129,+1269]mm · 7 of 38 slices shown, 9 images]
[im 5/38  brain]
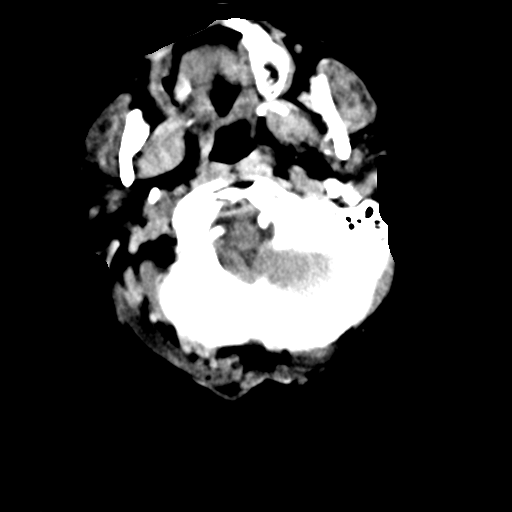
[im 5/38  bone]
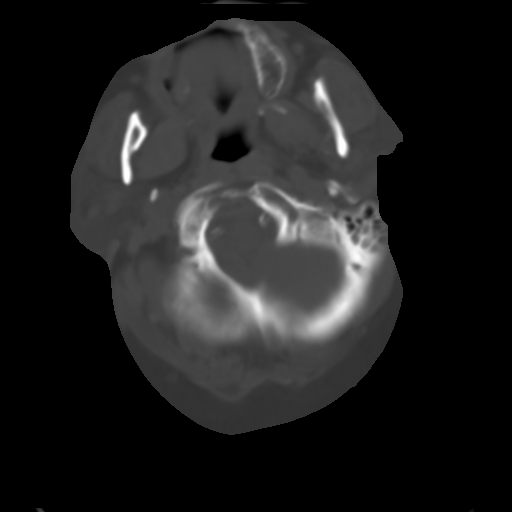
[im 10/38  brain]
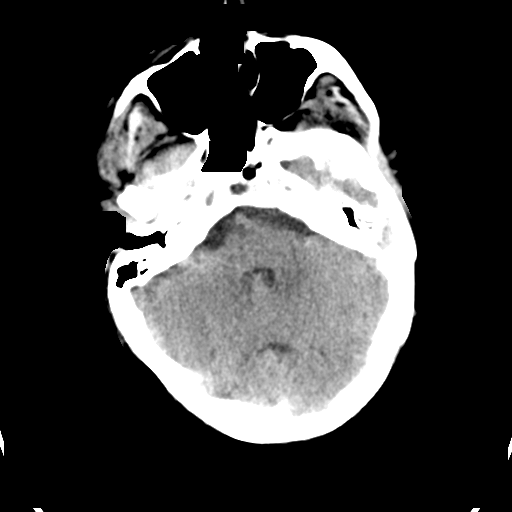
[im 14/38  brain]
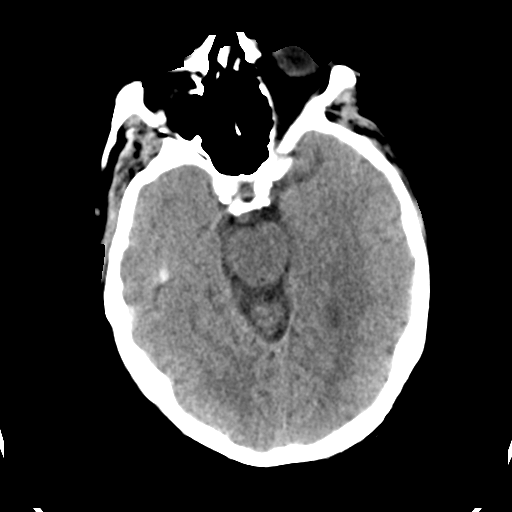
[im 19/38  brain]
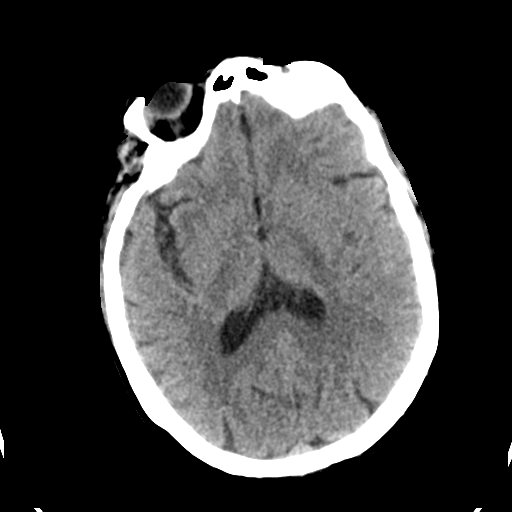
[im 24/38  brain]
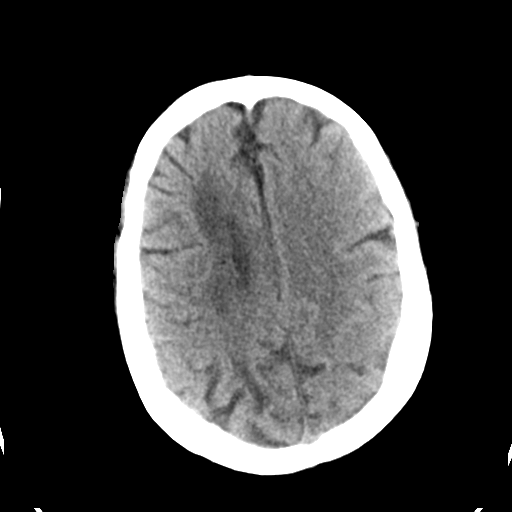
[im 24/38  bone]
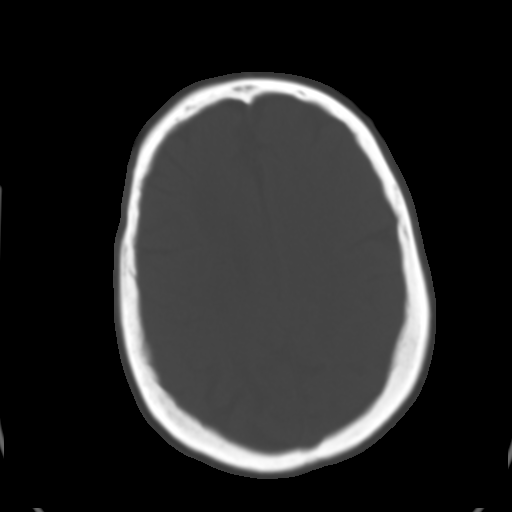
[im 28/38  brain]
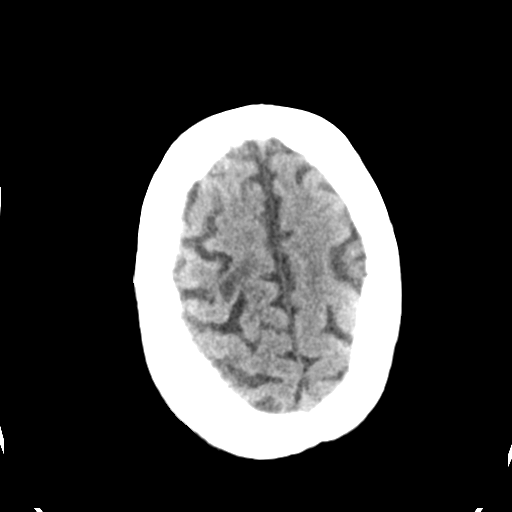
[im 33/38  brain]
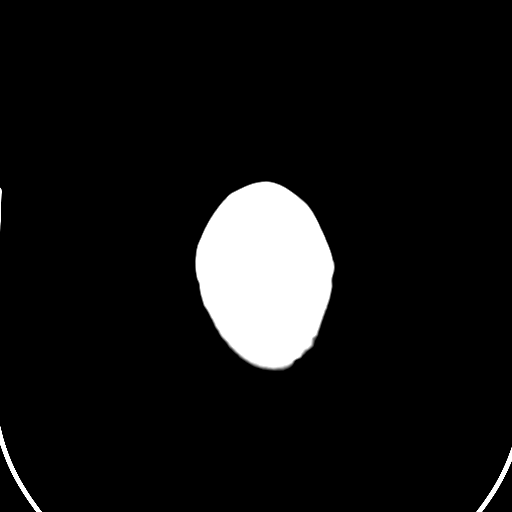

[Series 4: head bone · axial · 0.42mm/px · z∈[+1127,+1191]mm · 4 of 94 slices shown]
[im 10/94  bone]
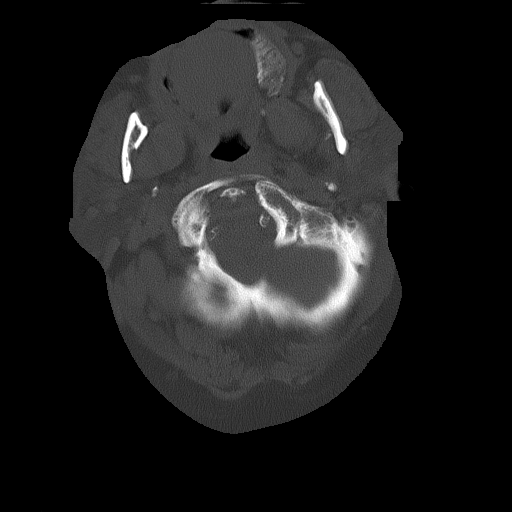
[im 19/94  bone]
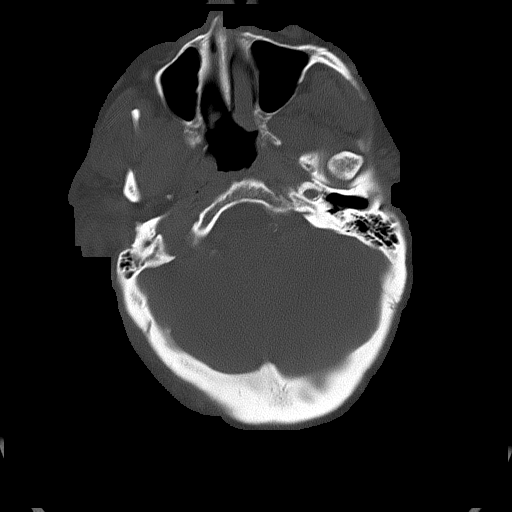
[im 28/94  bone]
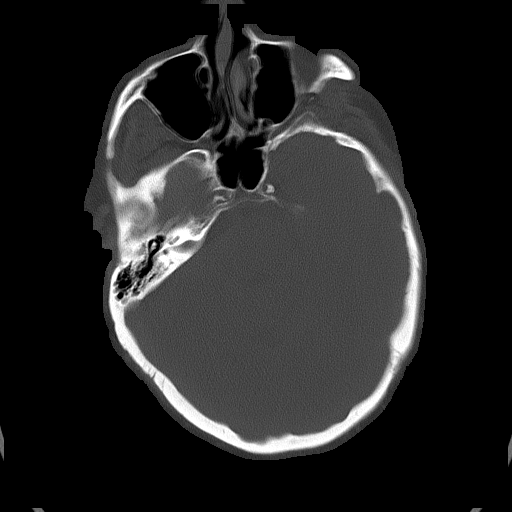
[im 42/94  bone]
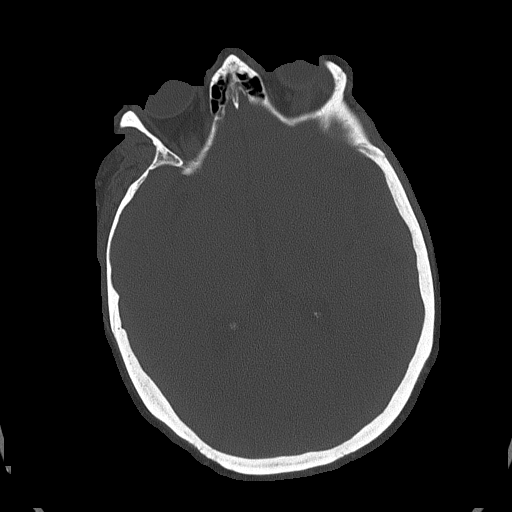

[Series 5: head without cor · coronal · non-contrast · 0.34mm/px · 3 of 67 slices shown]
[im 23/67  brain]
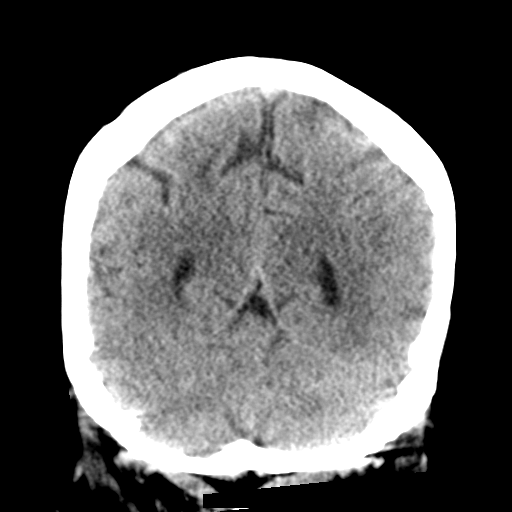
[im 30/67  brain]
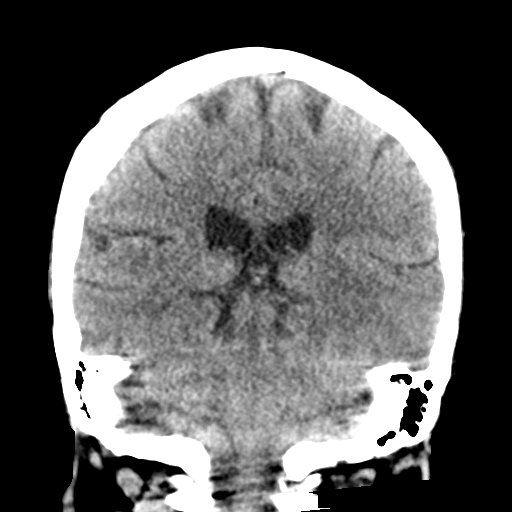
[im 37/67  brain]
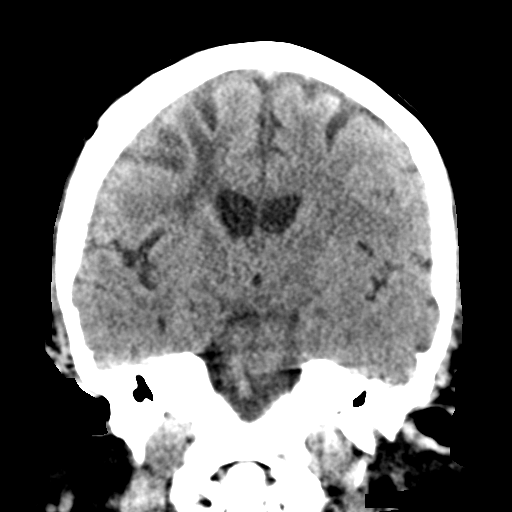

[Series 6: head without sag · sagittal · non-contrast · 0.35mm/px · 3 of 55 slices shown]
[im 19/55  brain]
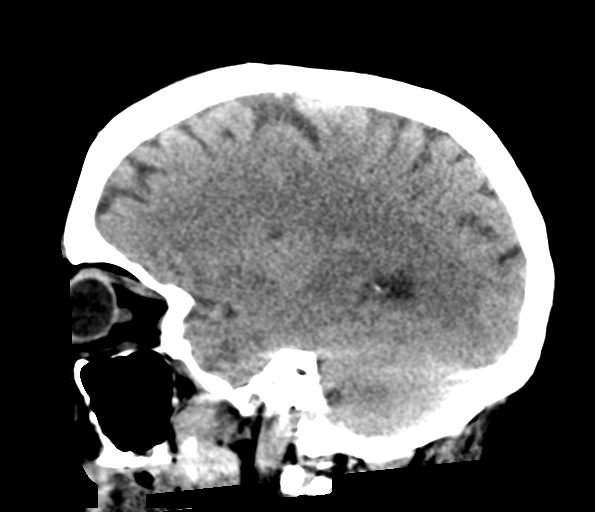
[im 28/55  brain]
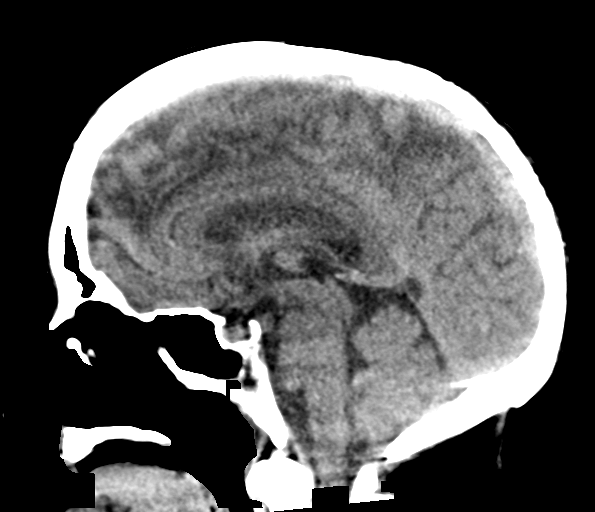
[im 37/55  brain]
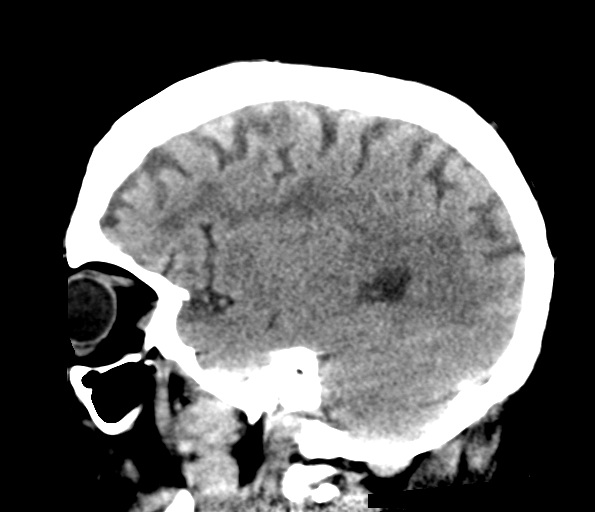

[17 of 47 positions shown; findings below may reference images not displayed]

FINDINGS: Brain: No evidence of acute infarction, hemorrhage, hydrocephalus,
extra-axial collection or mass lesion/mass effect. Remote right
frontal and parietal infarcts are unchanged.

Vascular: No hyperdense vessel or unexpected calcification.

Skull: Intact.  No focal lesion.

Sinuses/Orbits: Status post cataract surgery.  Otherwise negative.

Other: None.
IMPRESSION: No acute abnormality. Remote right frontal and parietal infarcts
again seen.

## 2021-09-16 IMAGING — DX DG CHEST 1V
1 series · 1 of 1 positions shown · non-contrast
Comparison: 12/01/2019

CLINICAL DATA: Altered level of consciousness, wheezing

EXAM:
CHEST  1 VIEW

[chest ap]
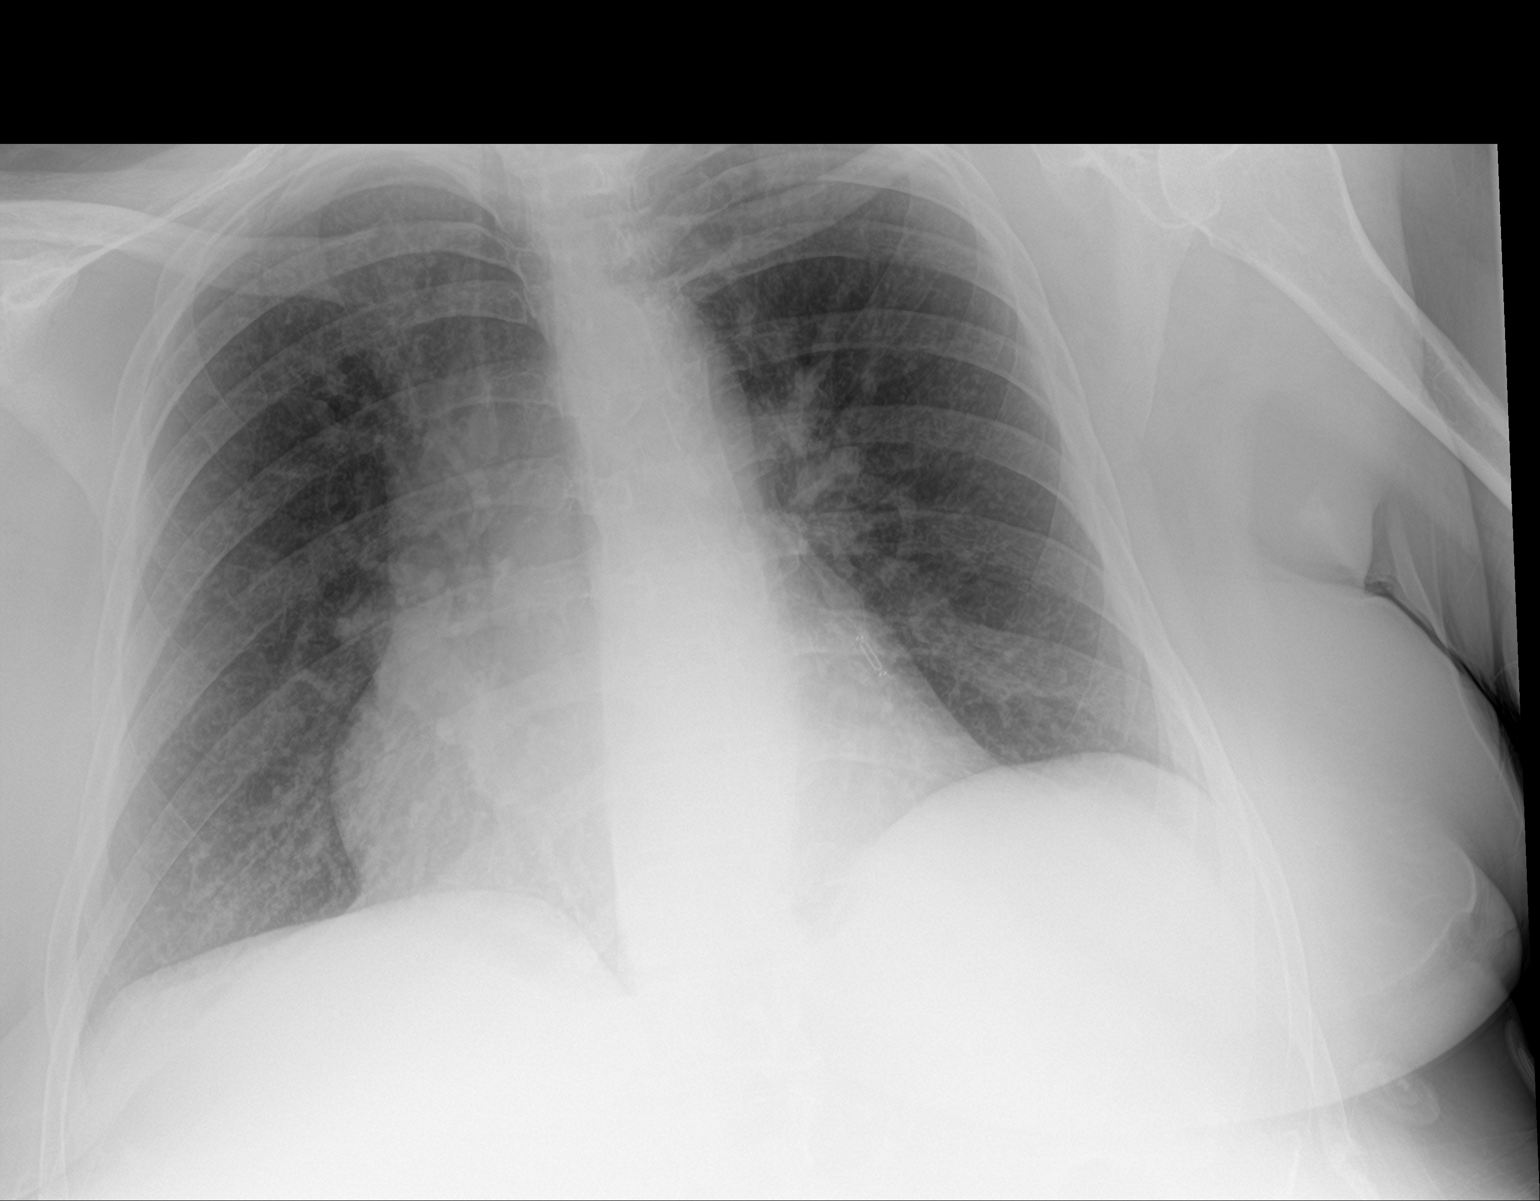

[1 of 1 positions shown; findings below may reference images not displayed]

FINDINGS: Single frontal view of the chest demonstrates a stable cardiac
silhouette. No airspace disease, effusion, or pneumothorax. Chronic
interstitial prominence consistent with known history of COPD.
IMPRESSION: 1. No acute intrathoracic process.
2. Chronic interstitial scarring compatible with known history of
COPD.

## 2021-09-16 IMAGING — CT CT HEAD W/O CM
5 of 8 series · 18 of 47 positions shown, 19 images · non-contrast
Comparison: [DATE] [DATE], [DATE] ([DATE] p.m.)

CLINICAL DATA: Altered mental status.

EXAM:
CT HEAD WITHOUT CONTRAST
TECHNIQUE: Contiguous axial images were obtained from the base of the skull
through the vertex without intravenous contrast.

[Series 3: head bone · axial · 0.45mm/px · z∈[+1276,+1398]mm · 8 of 77 slices shown]
[im 8/77  bone]
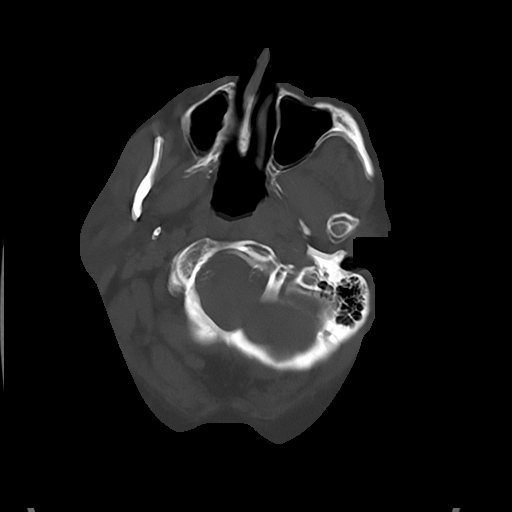
[im 16/77  bone]
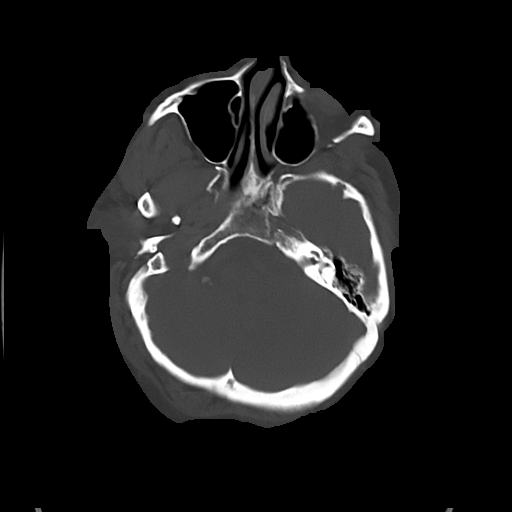
[im 23/77  bone]
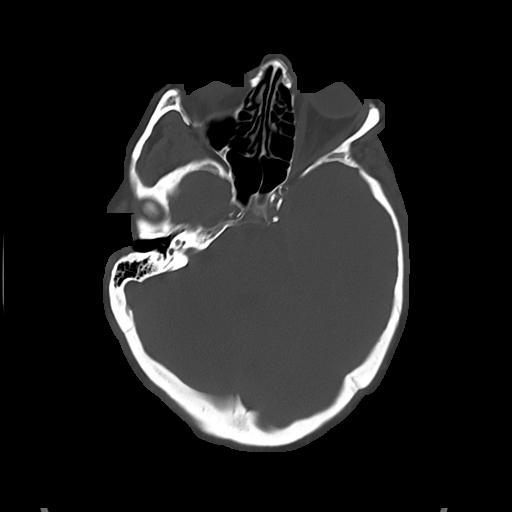
[im 31/77  bone]
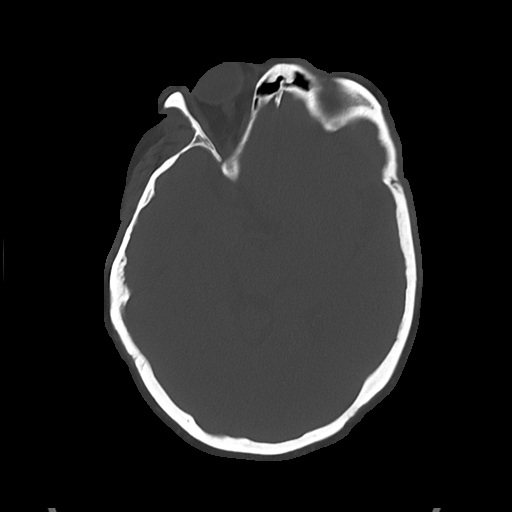
[im 46/77  bone]
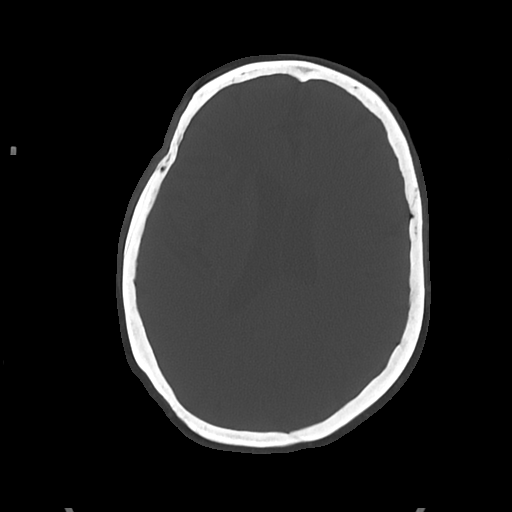
[im 54/77  bone]
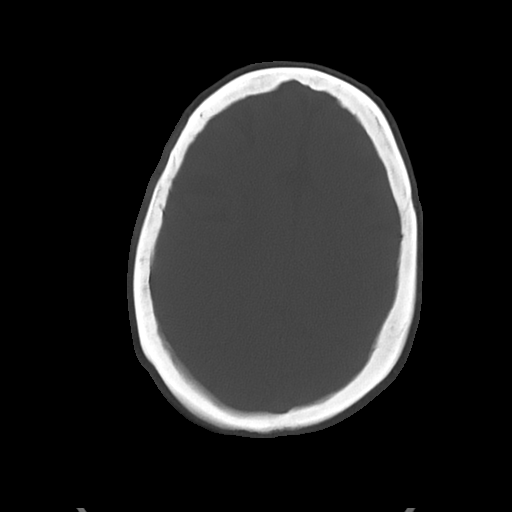
[im 61/77  bone]
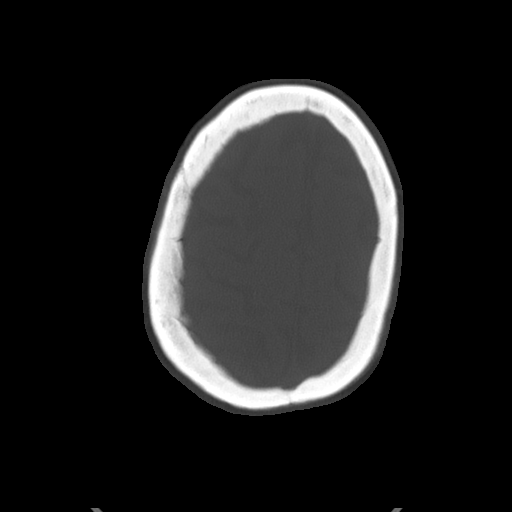
[im 69/77  bone]
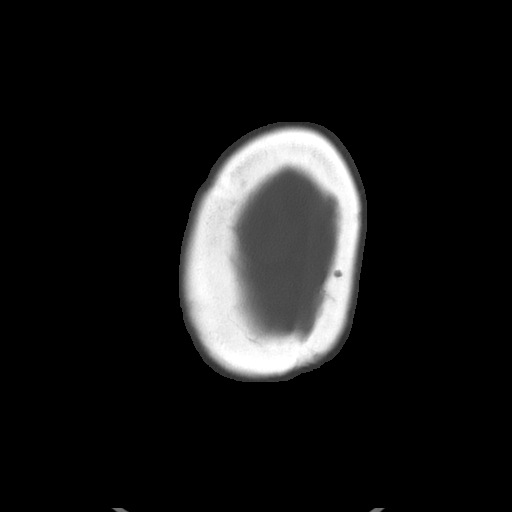

[Series 4: head wo · axial · 0.45mm/px · z∈[+1312,+1362]mm · 2 of 31 slices shown, 3 images (1 of 2)]
[im 11/31  brain]
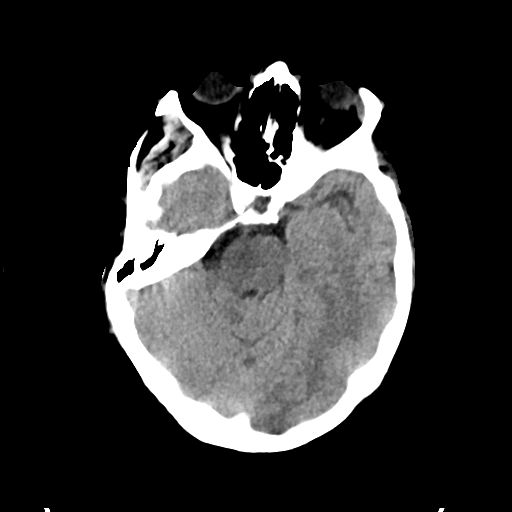
[im 11/31  bone]
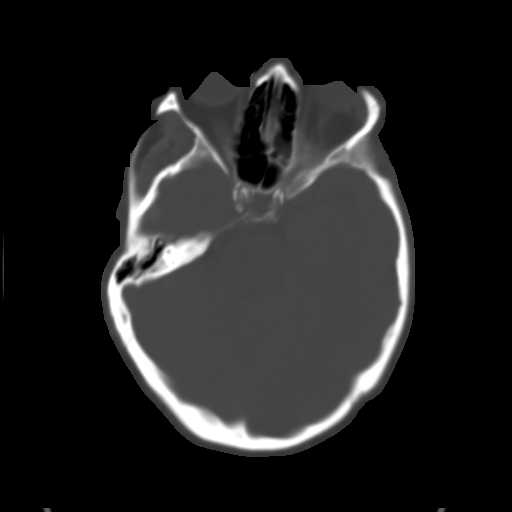
[im 21/31  brain]
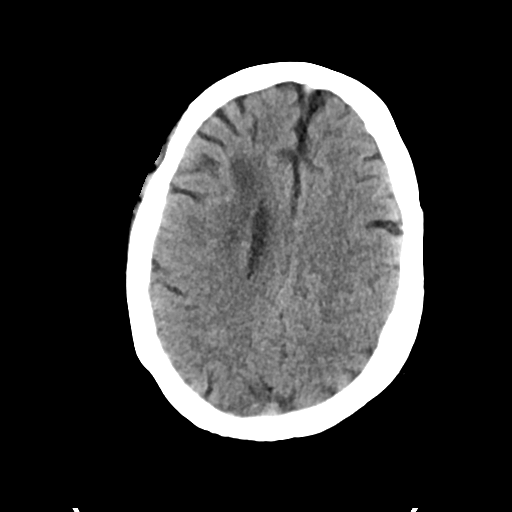

[Series 5: cor soft · coronal · 0.33mm/px · 3 of 71 slices shown]
[im 18/71  brain]
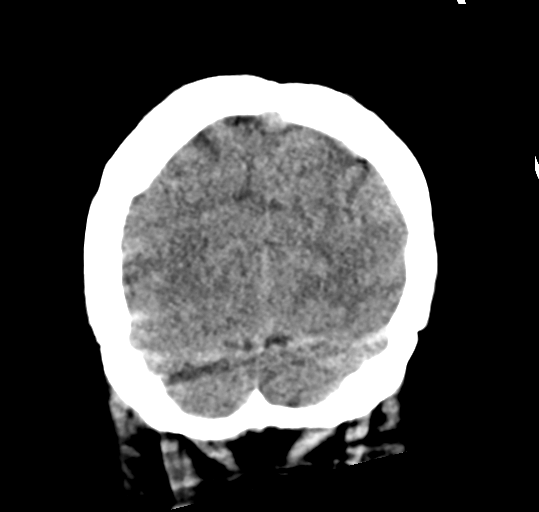
[im 36/71  brain]
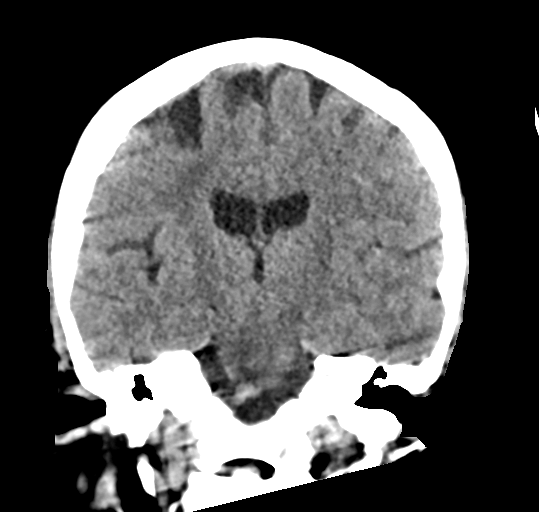
[im 53/71  brain]
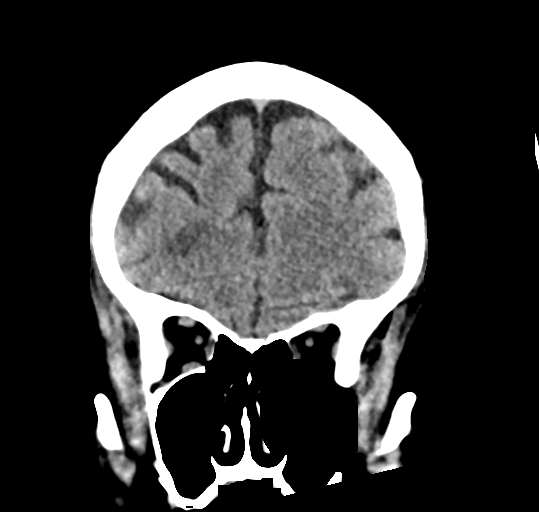

[Series 6: sag soft · sagittal · 0.33mm/px · 3 of 60 slices shown]
[im 11/60  brain]
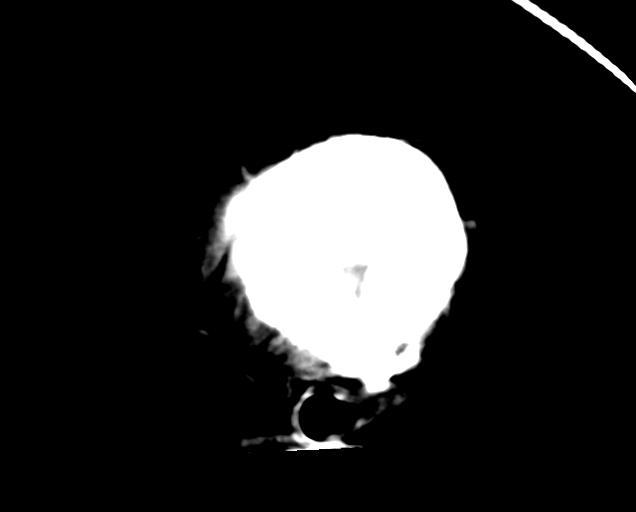
[im 19/60  brain]
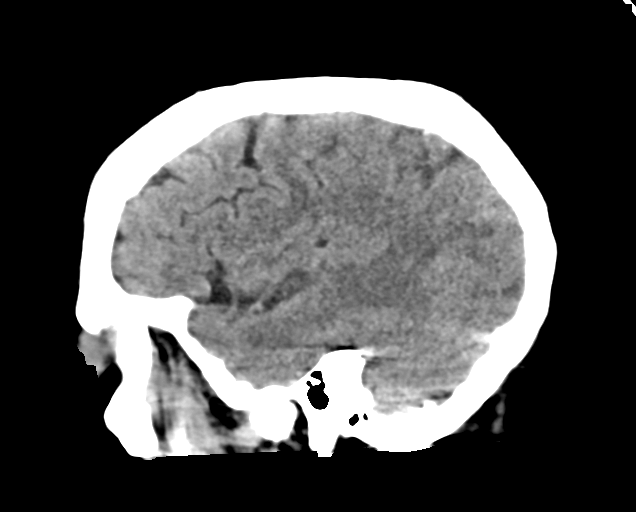
[im 28/60  brain]
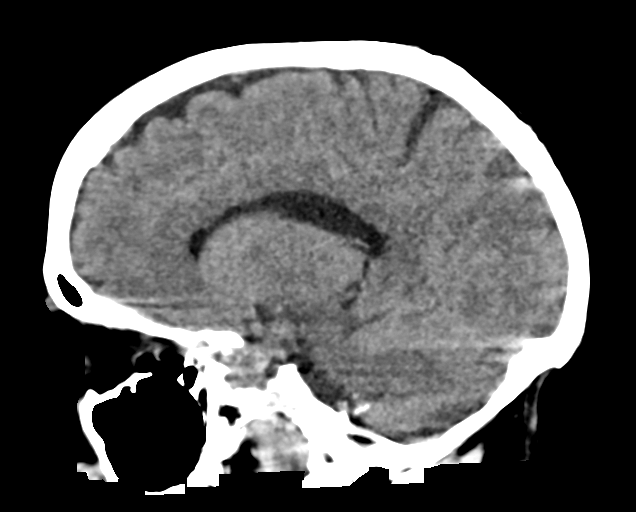

[Series 8: head wo · axial · 0.39mm/px · z∈[+1312,+1362]mm · 2 of 31 slices shown (2 of 2)]
[im 11/31  brain]
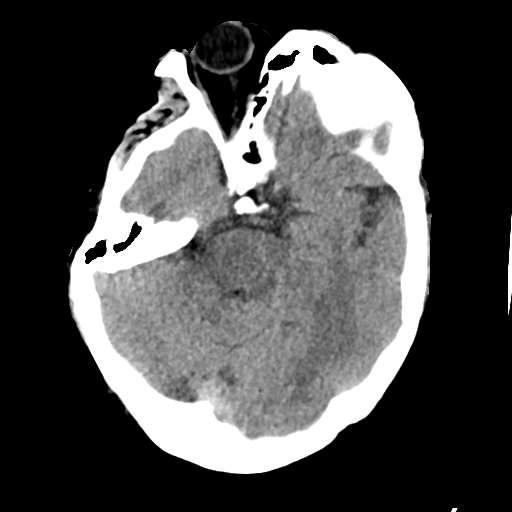
[im 21/31  brain]
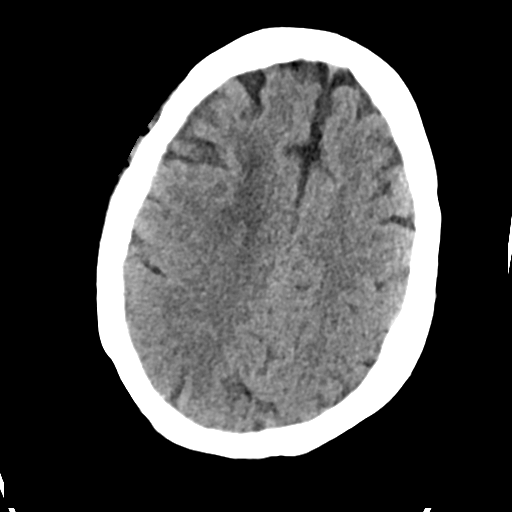

[18 of 47 positions shown; findings below may reference images not displayed]

FINDINGS: Brain: No evidence of acute infarction, hemorrhage, hydrocephalus,
extra-axial collection or mass lesion/mass effect. Ill-defined,
chronic right frontal and right parietal lobe infarcts are seen.

Vascular: No hyperdense vessel or unexpected calcification.

Skull: Normal. Negative for fracture or focal lesion.

Sinuses/Orbits: No acute finding.

Other: None.
IMPRESSION: 1. No acute intracranial abnormality.
2. Ill-defined, chronic right frontal and right parietal lobe
infarcts.

## 2021-09-16 NOTE — Telephone Encounter (Signed)
Pt outreach----  I came by to place amio in pill box-she was not there-but her roommate was there and let me in.   Will f/u next week.   Marylouise Stacks, EMT-Paramedic  09/16/21

## 2021-09-17 IMAGING — MR MR HEAD W/O CM
6 series · 48 of 48 positions shown · non-contrast
Comparison: Head CT from yesterday

CLINICAL DATA: Psychosis with mental status change.

EXAM:
MRI HEAD WITHOUT CONTRAST
TECHNIQUE: Multiplanar, multiecho pulse sequences of the brain and surrounding
structures were obtained without intravenous contrast.

[Series 5: DWI · axial · 3.0mm · 0.88mm/px · z∈[-89,+48]mm · 15 of 96 slices shown (1 of 4)]
[im 1/96]
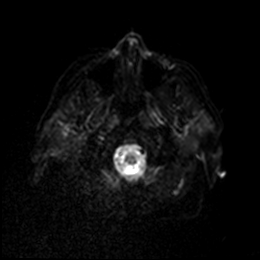
[im 7/96]
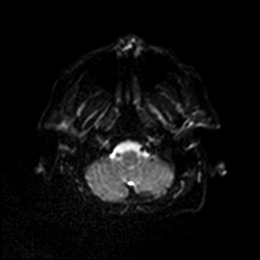
[im 14/96]
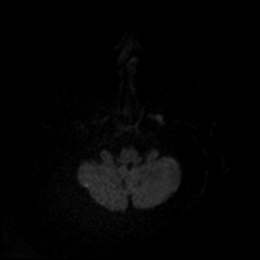
[im 21/96]
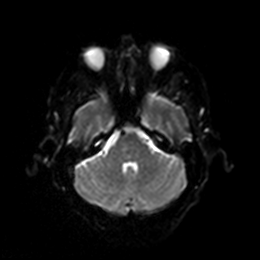
[im 28/96]
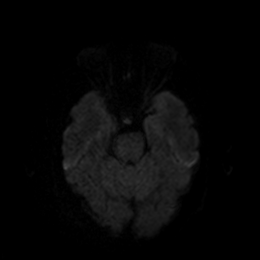
[im 34/96]
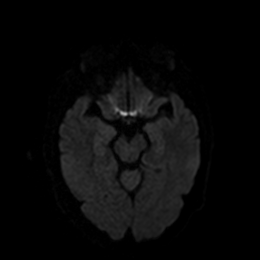
[im 41/96]
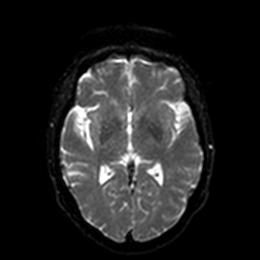
[im 48/96]
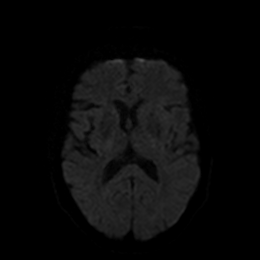
[im 55/96]
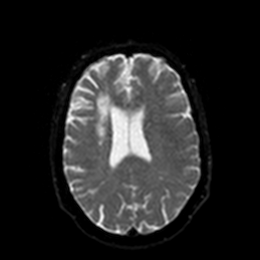
[im 62/96]
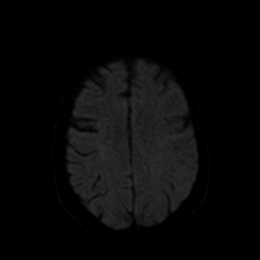
[im 68/96]
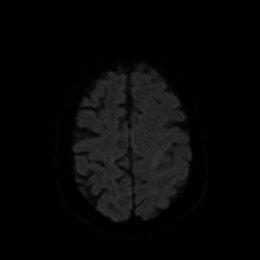
[im 75/96]
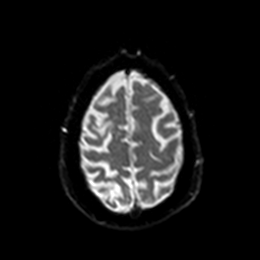
[im 82/96]
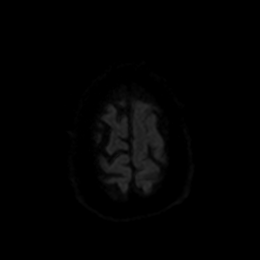
[im 89/96]
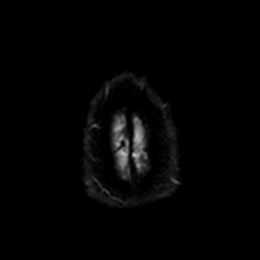
[im 96/96]
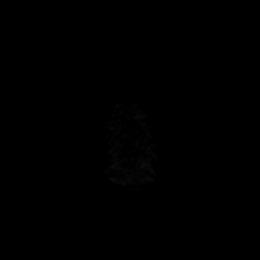

[Series 6: DWI · axial · 3.0mm · 0.88mm/px · z∈[-89,+48]mm · 8 of 48 slices shown (2 of 4)]
[im 1/48]
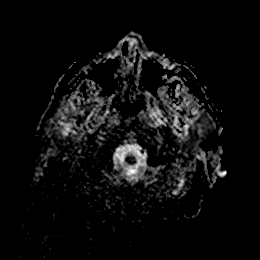
[im 7/48]
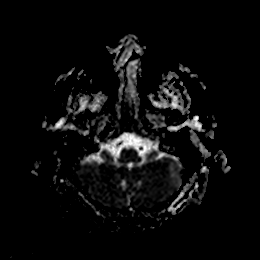
[im 14/48]
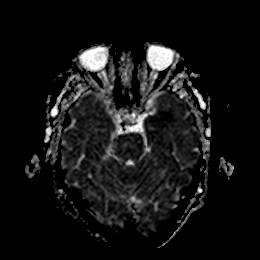
[im 21/48]
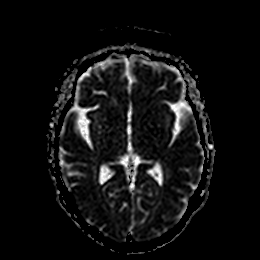
[im 27/48]
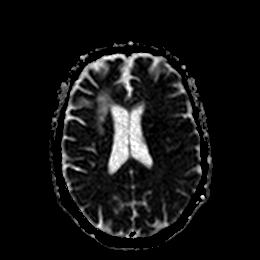
[im 34/48]
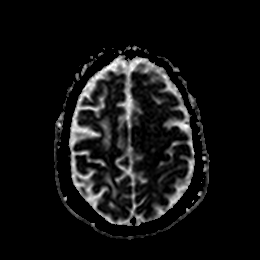
[im 41/48]
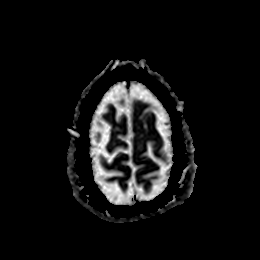
[im 48/48]
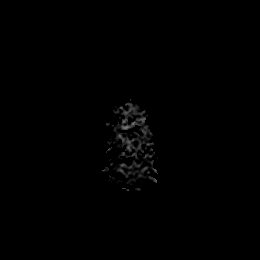

[Series 7: DWI · coronal · 4.0mm · 0.88mm/px · 11 of 68 slices shown (3 of 4)]
[im 1/68]
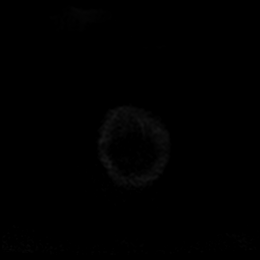
[im 7/68]
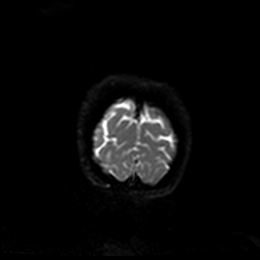
[im 14/68]
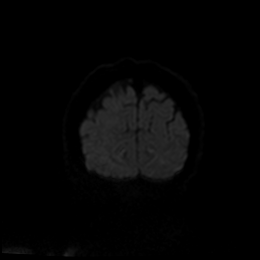
[im 21/68]
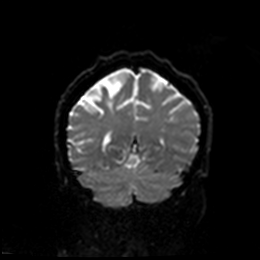
[im 27/68]
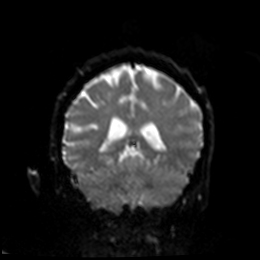
[im 34/68]
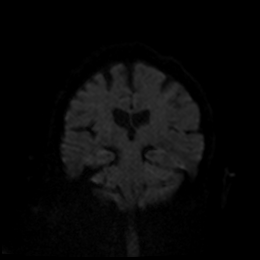
[im 41/68]
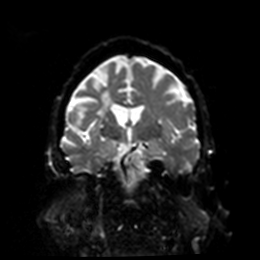
[im 47/68]
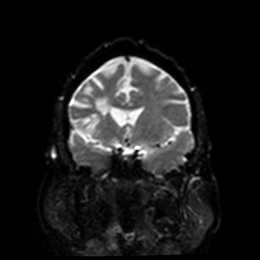
[im 54/68]
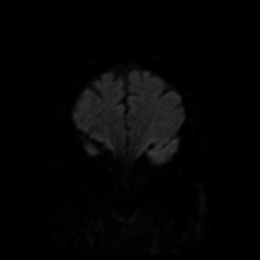
[im 61/68]
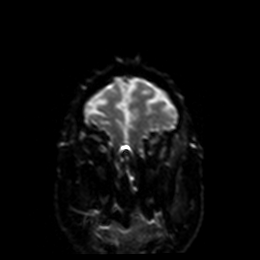
[im 68/68]
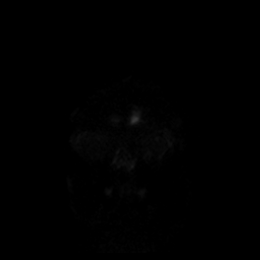

[Series 8: DWI · coronal · 4.0mm · 0.88mm/px · 6 of 34 slices shown (4 of 4)]
[im 1/34]
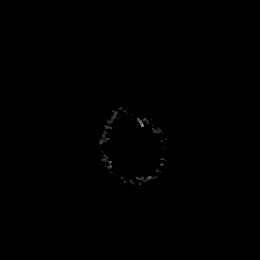
[im 7/34]
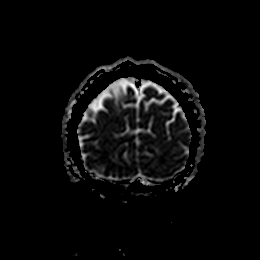
[im 14/34]
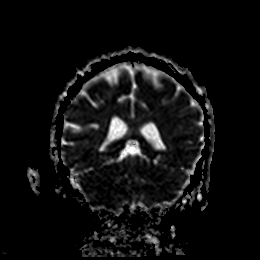
[im 20/34]
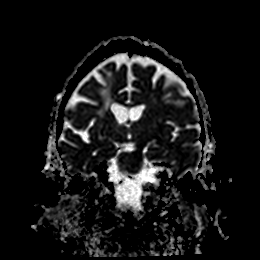
[im 27/34]
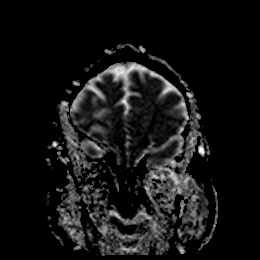
[im 34/34]
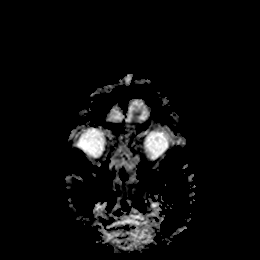

[Series 9: FLAIR · axial · 5.0mm · 0.90mm/px · z∈[-96,+44]mm · 4 of 25 slices shown]
[im 1/25]
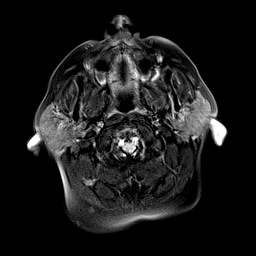
[im 9/25]
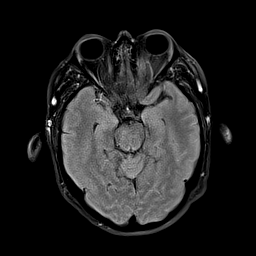
[im 17/25]
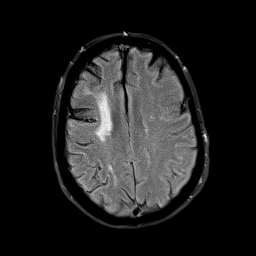
[im 25/25]
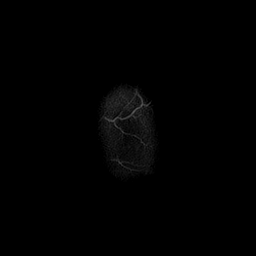

[Series 10: T2 · axial · 5.0mm · 0.72mm/px · z∈[-96,+44]mm · 4 of 25 slices shown]
[im 1/25]
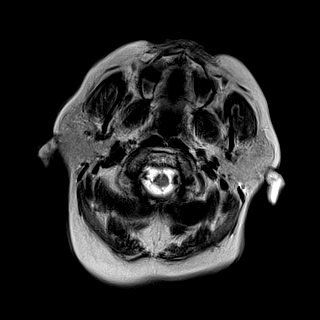
[im 9/25]
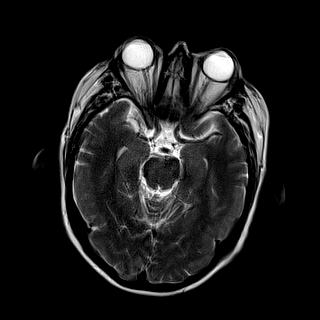
[im 17/25]
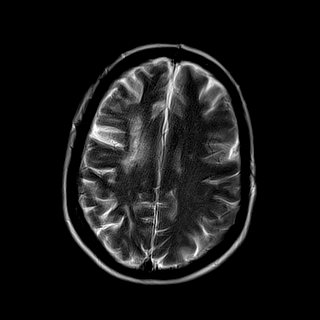
[im 25/25]
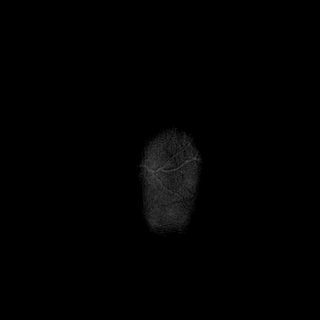

[48 of 48 positions shown; findings below may reference images not displayed]

FINDINGS: Brain: Remote cortically based infarcts along the high right frontal
parietal convexity, distal MCA to watershed territory. Chronic small
vessel ischemia in the cerebral white matter and pons. White matter
changes are greater on the right. Increased FLAIR signal within
right-sided MCA branches. This is attributed to ICA findings
described below. No acute hemorrhage, acute infarct, hydrocephalus,
mass, or collection.

Vascular: Loss of right carotid flow void in the neck on cavernous
segment with reconstitution by the supraclinoid segment.

Skull and upper cervical spine: Normal marrow signal

Sinuses/Orbits: Bilateral cataract resection.

Other: Significant motion degradation with truncated study. Axial
T1, gradient, and coronal T2 weighted images were not acquired.
IMPRESSION: 1. No acute finding.
2. Slow or absent flow in the right cervical carotid with remote
right cerebral infarcts.
3. Motion degraded study which was truncated.
4. Chronic small vessel disease.

## 2021-09-17 IMAGING — DX DG ABDOMEN 1V
2 series · 2 of 2 positions shown · non-contrast
Comparison: None recent

CLINICAL DATA: Change in mental status

EXAM:
ABDOMEN - 1 VIEW

[abdomen kub (1 of 2)]
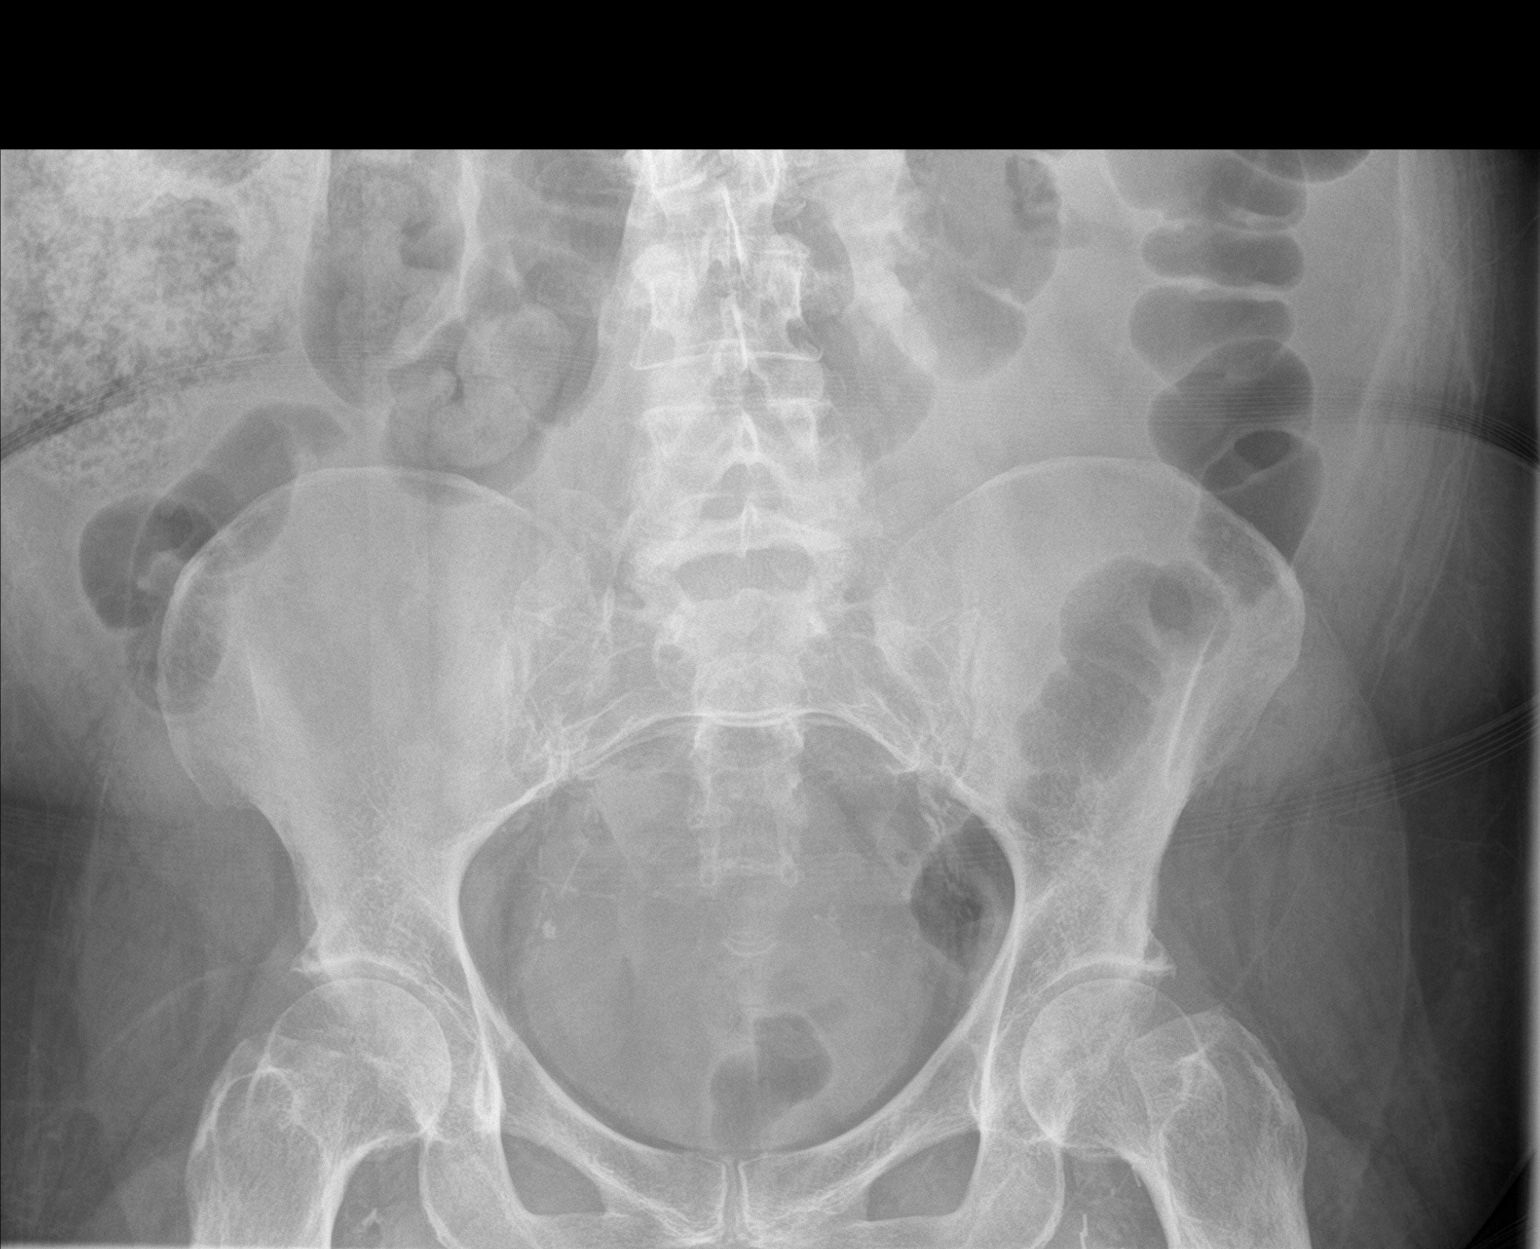

[abdomen kub (2 of 2)]
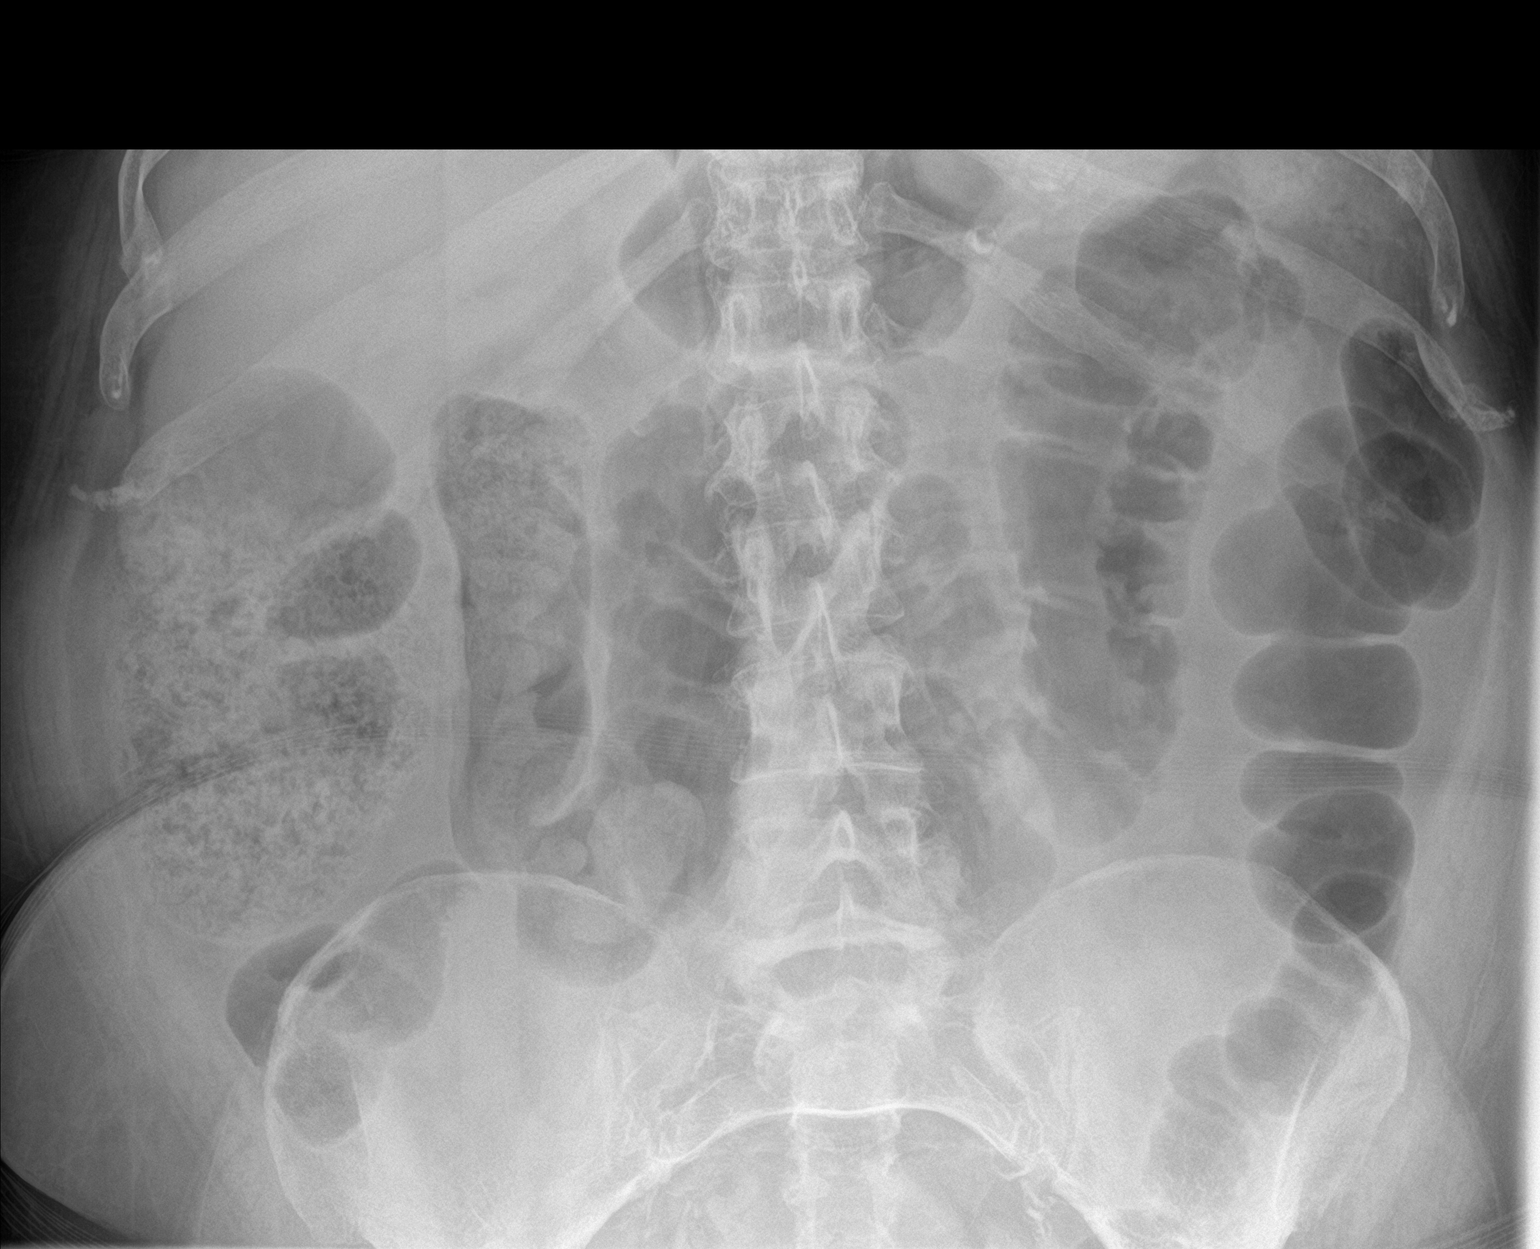

[2 of 2 positions shown; findings below may reference images not displayed]

FINDINGS: Gas throughout the colon with some stool seen proximally. No
obstructive pattern. No small bowel dilatation. No concerning mass
effect or stone like calcification. Atheromatous calcifications are
noted at the pelvis and left upper quadrant.
IMPRESSION: Nonobstructive bowel gas pattern with moderate colonic gas and
stool.

## 2021-09-20 NOTE — Telephone Encounter (Signed)
Fax has been received and will be faxed once PCP sign's off.

## 2021-09-21 ENCOUNTER — Other Ambulatory Visit: Payer: Self-pay

## 2021-09-21 ENCOUNTER — Encounter (HOSPITAL_BASED_OUTPATIENT_CLINIC_OR_DEPARTMENT_OTHER): Payer: Medicare Other | Attending: Internal Medicine | Admitting: Internal Medicine

## 2021-09-21 DIAGNOSIS — M86671 Other chronic osteomyelitis, right ankle and foot: Secondary | ICD-10-CM | POA: Diagnosis not present

## 2021-09-21 DIAGNOSIS — L97518 Non-pressure chronic ulcer of other part of right foot with other specified severity: Secondary | ICD-10-CM | POA: Diagnosis not present

## 2021-09-21 DIAGNOSIS — E11621 Type 2 diabetes mellitus with foot ulcer: Secondary | ICD-10-CM | POA: Insufficient documentation

## 2021-09-21 DIAGNOSIS — E1169 Type 2 diabetes mellitus with other specified complication: Secondary | ICD-10-CM | POA: Insufficient documentation

## 2021-09-21 DIAGNOSIS — L97512 Non-pressure chronic ulcer of other part of right foot with fat layer exposed: Secondary | ICD-10-CM | POA: Diagnosis not present

## 2021-09-21 DIAGNOSIS — I872 Venous insufficiency (chronic) (peripheral): Secondary | ICD-10-CM | POA: Diagnosis not present

## 2021-09-21 DIAGNOSIS — F17210 Nicotine dependence, cigarettes, uncomplicated: Secondary | ICD-10-CM | POA: Insufficient documentation

## 2021-09-21 DIAGNOSIS — Z89431 Acquired absence of right foot: Secondary | ICD-10-CM | POA: Diagnosis not present

## 2021-09-21 DIAGNOSIS — E1151 Type 2 diabetes mellitus with diabetic peripheral angiopathy without gangrene: Secondary | ICD-10-CM | POA: Insufficient documentation

## 2021-09-21 NOTE — Progress Notes (Signed)
CALIA, NAPP ([id number]) Visit Report for 09/21/2021 HPI Details Patient Name: Date of Service: [name] 09/21/2021 8:45 A M Medical Record Number: [id number] Patient Account Number: [id number] Date of Birth/Sex: Treating RN: 1961-10-24 (60 y.o. [name] Primary Care Provider: Charlott Rakes Other Clinician: Referring Provider: Treating Provider/Extender: Sindy Guadeloupe Weeks in Treatment: 30 History of Present Illness HPI Description: This 60 year old patient who has a very long significant history of diabetes mellitus, previous alcohol and nicotine abuse, chronic data disease, COPD, diabetes mellitus, height hypertension, critical lower limb ischemia with several wounds being managed at the wound Center at [facility] for over a year. She was recently in the ER at [facility] and was referred to our center. He has had a long history of critical limb ischemia and over a period of time has had balloon angioplasties in March 2016, endarterectomy of the left carotid, lower extremity angiogram and treatment by Dr. Quay Burow, several cardiac catheterizations. Most recently she had an x-ray of her left foot while in the ER which showed no acute abnormality. during this ER visit she was started on ciprofloxacin and asked to continue with the wound care physicians at [facility]. Her last ABI done in June 2017 showed the right side to be 0.28 in the left side to be 0.48. Her right toe brachial index was 0.17 on the right and 0.27 on the left. her last hemoglobin A1c was 11.1% She continues to smoke about a pack of cigarettes a day. 03/28/2017 -- -- right foot x-ray -- IMPRESSION:Areas of soft tissue swelling. Mild subluxation second PIP joint. No frank dislocation or fracture. No erosive change or bony destruction. No soft tissue ulceration or radiopaque foreign body evident by radiography. There is plantar fascia calcification with a nearby  inferior calcaneal spur. There are foci of arterial vascular calcification. 04/04/2017 -- the patient continues to be noncompliant and continues to complain of a lot of pain and has not done anything about quitting smoking Readmission: 06/26/18 on evaluation today patient presents for reevaluation she has not been seen in our office since May 2018. Since she was last seen here in our office she has undergone testing at Dr. Elspeth Cho office where it was revealed that she had an ABI of 0.68 with her previous ABI being 0.64 and a left ABI of 0.38 with previous ABI being 0.34 this study which was performed on 04/05/18 was pretty much equivalent to the study performed on 11/22/17. The findings in the end revealed on the right would appear to be moderate right lower extremity arterial disease on the left Dr. Gwenlyn Found states moderate in the report but unfortunately this appears to be much more severe at 0.38 compared to the right. Subsequently the patient was scheduled to have angiography with Dr. Gwenlyn Found on 04/12/18. This was however canceled due to the fact that the patient was found to have chronic kidney disease stage III and it was to the point that he did not feel that it will be safe to pursue angiography at that point. She has not been on any antibiotics recently. At this point Dr. Gwenlyn Found has not rescheduled anything as far as the injured [place] according to the patient he is somewhat reluctant to do so. Nonetheless with her diminished blood flow this is gonna make it somewhat difficult for her to heal. 07/05/18; this is a patient I have not seen previously. She has very significant PAD as noted above. She apparently has  had revascularization efforts by Dr. Gwenlyn Found on the right on 3 occasions to the patient. She was supposed to have an attempted angiography on the left however this was canceled apparently because of stage III chronic renal failure. I will need to research all of this. She complains of  significant pain in the wound and has claudication enough that when she walks to the end of the driveway she has to stop. She is been using silver alginate to the wounds on her legs and Iodoflex to the area on her left second toe. 07/13/18 on evaluation today patient's wounds actually appear to be doing about the same. She has an appointment she tells me within the next month that is September 2019 with Dr. Gwenlyn Found to discuss options to see if there's anything else that you can recommend or do for her. Nonetheless obviously what we're trying to do is do what we can to save her leg and in turn prevent any additional worsening or damage. None in the meantime we been mainly trying to manage her ulcers as best we can. 07/27/18; some improvement in the multiplicity of wounds on her left lower extremity and foot. She's been using silver alginate. I was unable to determine that she actually has an appointment with Dr. Gwenlyn Found. We are checking into this The patient's wound includes Left lateral foot, left plantar heel, her left anterior calf wound looks close to me, left fourth toe is very close to closed and the left medial calf is perhaps the largest open area READMISSION 02/19/2021 This is a now 60 year old woman with type 2 diabetes and continued cigarette smoking. She has known PAD. She was last in this clinic in 2019 at that point with wounds on her left foot. She left in a nonhealed state. On 06/28/2019 I see she had a left femoropopliteal by vein and vascular with an amputation of the fifth ray. These managed to heal over. Her last arterial studies were in February 2021. This showed an absent waveform at the posterior tibial artery on the right a dampened monophasic dorsalis pedis on the right of 0.44. On the left again the PTA TA was absent her dorsalis pedis was 0.99 triphasic and her great toe pressure was 0.65. This would have been after her revascularization. Once again she tells me that Dr. Gwenlyn Found has  done revascularization on the right leg on 3 different occasions. She has a right transmetatarsal amputation apparently done by Dr. Sharol Given remotely but I do not see a note for these right lower extremity revascularization but I may not of looked back far enough. In any case she says that the she has a wound on the plantar aspect of the right transmetatarsal amputation site there is been there for several months. More recently she fell and had a wound on her right medial lower leg she had 5 sutures placed in the ER and then subsequently she has developed an area on the medial lower leg which was a blister that opened up. Past medical history includes type 2 diabetes, PAD, chronic renal failure, congestive heart failure, right transmetatarsal amputation, left fifth ray amputation, continued tobacco abuse, atrial fibrillation and cirrhosis. We did not attempt an ABI on the right leg today because of pain 02/26/2021; patient we admitted to the clinic last week. She has what looks to be 2 areas on her right medial and right anterior lower leg that look more like venous wounds but she has 1 on the first met head at the base  of her right transmet. She has known severe PAD. She complains of a lot of pain although some of this may be neuropathic. Is difficult to exclude a component of claudication. Use silver alginate on the wounds on her legs and Iodoflex on the area on the first met head. She has an appointment with Dr. Gwenlyn Found on 5/25 although I will text him and discussed the situation. She is have poorly controlled diabetic. She has has stage IIIb chronic renal failure 4/22; patient presents for 1 week follow-up. The 2 areas on her right medial and right anterior lower leg appear well-healing. She has been using silver alginate to this area without issues. She has a first met head ulcer and it is unclear how long this has been there as it was discovered in the ED earlier this month when she was being  evaluated for another issue. Iodoflex has been used at this area. 4/29; patient presents for 1 week follow-up. She has been using silver alginate to the leg wounds and Iodoflex to the plantar foot wound. She has had this wrapped with Coban and Kerlix. She has no concerns or complaints today. 5/6; this is a difficult wound on the right plantar foot transmit site. We are not making a lot of progress. She had 2 more venous looking wounds on the right medial and right anterior lower leg 1 of which is healed. Her appointment with Dr. Gwenlyn Found is on 5/25 5/16; she did not tolerate the offloading shoe we gave her in fact she had a fall with an abrasion on her right forearm she is now back in the modified small shoe which does not offload her foot properly. Her appoint with Dr. Gwenlyn Found is still on 5/25 we have been using Iodoflex. The area on the right anterior lower leg is healed 7/18; patient presents for follow-up however has not been seen in 2 months. She was last seen at the end of May. She had a fall at the end of June and was hospitalized. She has been unable to follow-up since then. For the wound she has only been keeping it covered with gauze. She is scheduled for an aortogram with lower extremity runoff at the end of July. 7/29; patient presents for 2-week follow-up. She has home health and they have been changing her dressings. She denies any issues and has no complaints today. She states she had a procedure where they opened up one of her vessels in her legs. She denies signs of infection. 8/5; patient presents for follow-up. She was recently in the hospital for bradycardia. She was noted to have cellulitis to the left lower extremity and Unna boot was placed due to blisters and increased swelling. She reports improvement to her left leg since being in the hospital and stability check her right plantar foot wound. She denies signs of infection. 8/23; since I last saw this patient a lot has happened.  She still has the area over the right first met head in the setting of previous transmetatarsal amputation. Firstly most importantly she underwent revascularization of her occluded right SFA. She underwent directional atherectomy followed by drug-coated balloon angioplasty. She has no named vessel below the knee hopefully the revascularization of the right SFA will improve collateral flow. It is not felt however that she has any endovascular options. She had follow-up arterial studies noninvasive on 8/9. These showed an ABI of 0.44 at the right PTA. Monophasic waveforms. On the left her great toe ABI was 0.68. Her follow-up arterial  Doppler showed a 50 to 74% stenosis in the proximal SFA and a 50 to 74% stenosis in the distal SFA mild to severe atherosclerosis noted throughout the extremity. Areas of shadowing plaque seen, unable to rule out higher grade stenosis she also had a fall apparently wearing a right foot forefoot off loader that we gave her. She therefore comes in in an ordinary running shoe today. Not certain if this is the fall that ended up in the hospital with bradycardia 9/6 area over the right first metatarsal head in the setting of her previous amputation and severe PAD. She is also a diabetic with known PAD status post attempt at revascularization. She is continued smoker Again the separation of visits in the clinic is somewhat disturbing if we are going to consider her for a total contact cast. She is not wearing anything to offload this stating it causes imbalance especially the forefoot off loader. She basically comes in in bedroom slippers She also had a fall this morning about an hour ago. She has a skin tear on the left dorsal forearm 9/13; areas over the right first metatarsal head in the setting of previous TMA and severe PAD. Again she comes in here in slippers. We have been using Hydrofera Blue. We use MolecuLight on this which was essentially negative study 9/20; right  first metatarsal head in the setting of her previous TMA and severe PAD. Same slipper type shoes. She says she cannot wear a forefoot off loader and for some reason she will not wear surgical shoes. She says she is smoking half a pack per day. 9/30; right first metatarsal head. Absolutely no improvement in fact there is tunneling superiorly very close to bone. She does not offload this properly at all. We use MolecuLight on this 2 weeks ago that did not show any surface bacteria we have been using silver collagen is a dressing She tells me she is down to 3 cigarettes a day I have offered encouragement and a treatment plan 10/6; right first metatarsal head. Exposed bone this week which is not surprising. We have been using silver collagen. She is smoking 5 to 10 cigarettes a day 10/17; she has not been here in 10 days. The bone scraping that I did on 10/6 showed staph lugdunensis, staph epidermidis and Pseudomonas Alcalifenes. I put in for Septra DS 1 p.o. twice daily for 14 days last week would be but we could not reach her to start the antibiotic. Quinolones would have been a good alternative except she has multiple drug interactions. Septra would not cover what ever the Pseudomonas is but I am not really sure of the relevance here. I am going to try her on the Septra for 2 weeks. She has a probing wound on the right TMA that probes to bone. She claims to have just about stop smoking I reviewed her arterial status. She underwent a left fem-tib bypass by Dr. Donnetta Hutching in 06/28/2019. Her last angiogram was in July of this year by Dr. Gwenlyn Found. This showed an occluded right SFA the popliteal and tibial vessels were also occluded the peroneal vessel filled by collaterals and filled the PT and DP by communicating collaterals. She was not felt to have any additional and vascular options. Dr. Gwenlyn Found noted that she he had previously revascularized her right SFA 3 times. 10/25; she is tolerating the Septra but says it  makes her nauseated. I am going to continue this for an additional 2 weeks perhaps for a total of  6 weeks. Her wound does not look too much different. There is on the right first met metatarsal head. There is probing areas around the tissue that is in the middle of the wound. There is no purulence I cannot feel bone. The original scepter was for a bone scraping. 11/8; right first metatarsal head. This does not probe to bone and there is no purulence. As far as I can tell she is completed the Septra although there were problems with exact length of time and I think compliance. In any case I gave her 6 weeks. I have reviewed her PAD above. Electronic Signature(s) Signed: 09/21/2021 4:39:28 PM By: Linton Ham MD Entered By: Linton Ham on 09/21/2021 09:36:15 -------------------------------------------------------------------------------- Physical Exam Details Patient Name: Date of Service: [name], [name] 09/21/2021 8:45 A M Medical Record Number: [id number] Patient Account Number: [id number] Date of Birth/Sex: Treating RN: 02/20/1961 (60 y.o. [name] Primary Care Provider: Other Clinician: Charlott Rakes Referring Provider: Treating Provider/Extender: Sindy Guadeloupe Weeks in Treatment: 30 Constitutional Patient is hypertensive.. Pulse regular and within target range for patient.[name] Respirations regular, non-labored and within target range.. Temperature is normal and within the target range for the patient.[name] Appears in no distress. Notes Wound exam; right first metatarsal head. Again the wound itself is small and appears to be filled with tissue however there is probing on either side of this with undermining. Direct depth 2.5 cm. I did not feel any debridement was necessary. There was no palpable bone. No purulence. Electronic Signature(s) Signed: 09/21/2021 4:39:28 PM By: Linton Ham MD Entered By: Linton Ham on 09/21/2021  09:38:34 -------------------------------------------------------------------------------- Physician Orders Details Patient Name: Date of Service: [name], [name] 09/21/2021 8:45 A M Medical Record Number: [id number] Patient Account Number: [id number] Date of Birth/Sex: Treating RN: Jan 18, 1961 (60 y.o. 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 - with Dr. Dellia Nims Return A Bathing/ Shower/ Hygiene May shower with protection but do not get wound dressing(s) wet. Edema Control - Lymphedema / SCD / Other Elevate legs to the level of the heart or above for 30 minutes daily and/or when sitting, a frequency of: - throughout the day Avoid standing for long periods of time. Patient to wear own compression stockings every day. - left leg Exercise regularly Off-Loading Open toe surgical shoe to: - with felt padding to offload Additional Orders / Instructions Stop/Decrease Smoking Follow Nutritious Diet Wound Treatment Wound #16 - Amputation Site - Transmetatarsal Wound Laterality: Plantar, Right Cleanser: Soap and Water 1 x Per Week/7 Days Discharge Instructions: May shower and wash wound with dial antibacterial soap and water prior to dressing change. Cleanser: Wound Cleanser 1 x Per Week/7 Days Discharge Instructions: Cleanse the wound with wound cleanser or normal saline prior to applying a clean dressing  using gauze sponges, not tissue or cotton balls. Peri-Wound Care: Sween Lotion (Moisturizing lotion) (Home Health) 1 x Per Week/7 Days Discharge Instructions: Apply moisturizing lotion as directed Prim Dressing: IODOFLEX 0.9% Cadexomer Iodine Pad 4x6 cm 1 x Per Week/7 Days ary Discharge  Instructions: Apply to wound bed as instructed Secondary Dressing: Woven Gauze Sponge, Non-Sterile 4x4 in 1 x Per Week/7 Days Discharge Instructions: Apply over primary dressing as directed. Secondary Dressing: ABD Pad, 5x9 1 x Per Week/7 Days Discharge Instructions: Apply over primary dressing as directed. Secondary Dressing: Optifoam Non-Adhesive Dressing, 4x4 in 1 x Per Week/7 Days Discharge Instructions: Foam donut to help offload Compression Wrap: Kerlix Roll 4.5x3.1 (in/yd) 1 x Per Week/7 Days Discharge Instructions: Apply Kerlix and Coban compression as directed. Compression Wrap: Coban Self-Adherent Wrap 4x5 (in/yd) 1 x Per Week/7 Days Discharge Instructions: Apply over Kerlix as directed. Electronic Signature(s) Signed: 09/21/2021 4:39:28 PM By: Linton Ham MD Signed: 09/21/2021 4:51:17 PM By: Lorrin Jackson Entered By: Lorrin Jackson on 09/21/2021 09:15:34 -------------------------------------------------------------------------------- Problem List Details Patient Name: Date of Service: [name], [name] 09/21/2021 8:45 A M Medical Record Number: [id number] Patient Account Number: [id number] Date of Birth/Sex: Treating RN: 1961/09/18 (60 y.o. Tonita Phoenix, Lauren Primary Care Provider: Charlott Rakes Other Clinician: Referring Provider: Treating Provider/Extender: Sindy Guadeloupe Weeks in Treatment: 30 Active Problems ICD-10 Encounter Code Description Active Date MDM Diagnosis L97.518 Non-pressure chronic ulcer of other part of right foot with other specified 09/07/2021 No Yes severity 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 2 diabetes mellitus with foot ulcer 02/19/2021 No Yes M86.671 Other  chronic osteomyelitis, right ankle and foot 08/30/2021 No Yes E11.51 Type 2 diabetes mellitus with diabetic peripheral angiopathy without gangrene 02/19/2021 No Yes I87.2 Venous insufficiency (chronic) (peripheral) 06/18/2021 No Yes Inactive Problems ICD-10 Code Description Active Date Inactive Date L97.811 Non-pressure chronic ulcer of other part of right lower leg limited to breakdown of skin 02/19/2021 02/19/2021 S81.802A Unspecified open wound, left lower leg, initial encounter 06/18/2021 06/18/2021 S40.812D Abrasion of left upper arm, subsequent encounter 07/20/2021 07/20/2021 Resolved Problems ICD-10 Code Description Active Date Resolved Date S40.811D Abrasion of right upper arm, subsequent encounter 03/29/2021 03/29/2021 Electronic Signature(s) Signed: 09/21/2021 4:39:28 PM By: Linton Ham MD Entered By: Linton Ham on 09/21/2021 09:34:22 -------------------------------------------------------------------------------- Progress Note Details Patient Name: Date of Service: [name], [name] 09/21/2021 8:45 A M Medical Record Number: [id number] Patient Account Number: [id number] Date of Birth/Sex: Treating RN: 05-11-1961 (60 y.o. Tonita Phoenix, Lauren Primary Care Provider: Charlott Rakes Other Clinician: Referring Provider: Treating Provider/Extender: Sindy Guadeloupe Weeks in Treatment: 30 Subjective History of Present Illness (HPI) This 60 year old patient who has a very long significant history of diabetes mellitus, previous alcohol and nicotine abuse, chronic data disease, COPD, diabetes mellitus, height hypertension, critical lower limb ischemia with several wounds being managed at the wound Center at [facility] for over a year. She was recently in the ER at [facility] and was referred to our center. He has had a long history of critical limb ischemia and over a period of time has had balloon angioplasties in March 2016, endarterectomy of the left carotid, lower  extremity angiogram and treatment by Dr. Quay Burow, several cardiac catheterizations. Most recently she had an x-ray of her left foot while in the ER which showed no acute abnormality. during this ER visit she was started on ciprofloxacin and asked to continue with the wound care physicians at [facility]. Her last ABI done in June 2017 showed the right side to be 0.28 in the left side to be 0.48. Her right toe brachial index was 0.17 on the right and 0.27 on the left. her last hemoglobin A1c was 11.1% She continues to smoke about a pack of cigarettes a day. 03/28/2017 -- --  right foot x-ray -- IMPRESSION:Areas of soft tissue swelling. Mild subluxation second PIP joint. No frank dislocation or fracture. No erosive change or bony destruction. No soft tissue ulceration or radiopaque foreign body evident by radiography. There is plantar fascia calcification with a nearby inferior calcaneal spur. There are foci of arterial vascular calcification. 04/04/2017 -- the patient continues to be noncompliant and continues to complain of a lot of pain and has not done anything about quitting smoking Readmission: 06/26/18 on evaluation today patient presents for reevaluation she has not been seen in our office since May 2018. Since she was last seen here in our office she has undergone testing at Dr. Fransico Ayuub Penley office where it was revealed that she had an ABI of 0.68 with her previous ABI being 0.64 and a left ABI of 0.38 with previous ABI being 0.34 this study which was performed on 04/05/18 was pretty much equivalent to the study performed on 11/22/17. The findings in the end revealed on the right would appear to be moderate right lower extremity arterial disease on the left Dr. Gwenlyn Found states moderate in the report but unfortunately this appears to be much more severe at 0.38 compared to the right. Subsequently the patient was scheduled to have angiography with Dr. Gwenlyn Found on 04/12/18. This was however  canceled due to the fact that the patient was found to have chronic kidney disease stage III and it was to the point that he did not feel that it will be safe to pursue angiography at that point. She has not been on any antibiotics recently. At this point Dr. Gwenlyn Found has not rescheduled anything as far as the injured [place] according to the patient he is somewhat reluctant to do so. Nonetheless with her diminished blood flow this is gonna make it somewhat difficult for her to heal. 07/05/18; this is a patient I have not seen previously. She has very significant PAD as noted above. She apparently has had revascularization efforts by Dr. Gwenlyn Found on the right on 3 occasions to the patient. She was supposed to have an attempted angiography on the left however this was canceled apparently because of stage III chronic renal failure. I will need to research all of this. She complains of significant pain in the wound and has claudication enough that when she walks to the end of the driveway she has to stop. She is been using silver alginate to the wounds on her legs and Iodoflex to the area on her left second toe. 07/13/18 on evaluation today patient's wounds actually appear to be doing about the same. She has an appointment she tells me within the next month that is September 2019 with Dr. Gwenlyn Found to discuss options to see if there's anything else that you can recommend or do for her. Nonetheless obviously what we're trying to do is do what we can to save her leg and in turn prevent any additional worsening or damage. None in the meantime we been mainly trying to manage her ulcers as best we can. 07/27/18; some improvement in the multiplicity of wounds on her left lower extremity and foot. She's been using silver alginate. I was unable to determine that she actually has an appointment with Dr. Gwenlyn Found. We are checking into this The patient's wound includes Left lateral foot, left plantar heel, her left anterior calf  wound looks close to me, left fourth toe is very close to closed and the left medial calf is perhaps the largest open area READMISSION 02/19/2021 This is  a now 60 year old woman with type 2 diabetes and continued cigarette smoking. She has known PAD. She was last in this clinic in 2019 at that point with wounds on her left foot. She left in a nonhealed state. On 06/28/2019 I see she had a left femoropopliteal by vein and vascular with an amputation of the fifth ray. These managed to heal over. Her last arterial studies were in February 2021. This showed an absent waveform at the posterior tibial artery on the right a dampened monophasic dorsalis pedis on the right of 0.44. On the left again the PTA TA was absent her dorsalis pedis was 0.99 triphasic and her great toe pressure was 0.65. This would have been after her revascularization. Once again she tells me that Dr. Gwenlyn Found has done revascularization on the right leg on 3 different occasions. She has a right transmetatarsal amputation apparently done by Dr. Sharol Given remotely but I do not see a note for these right lower extremity revascularization but I may not of looked back far enough. In any case she says that the she has a wound on the plantar aspect of the right transmetatarsal amputation site there is been there for several months. More recently she fell and had a wound on her right medial lower leg she had 5 sutures placed in the ER and then subsequently she has developed an area on the medial lower leg which was a blister that opened up. Past medical history includes type 2 diabetes, PAD, chronic renal failure, congestive heart failure, right transmetatarsal amputation, left fifth ray amputation, continued tobacco abuse, atrial fibrillation and cirrhosis. We did not attempt an ABI on the right leg today because of pain 02/26/2021; patient we admitted to the clinic last week. She has what looks to be 2 areas on her right medial and right anterior  lower leg that look more like venous wounds but she has 1 on the first met head at the base of her right transmet. She has known severe PAD. She complains of a lot of pain although some of this may be neuropathic. Is difficult to exclude a component of claudication. Use silver alginate on the wounds on her legs and Iodoflex on the area on the first met head. She has an appointment with Dr. Gwenlyn Found on 5/25 although I will text him and discussed the situation. She is have poorly controlled diabetic. She has has stage IIIb chronic renal failure 4/22; patient presents for 1 week follow-up. The 2 areas on her right medial and right anterior lower leg appear well-healing. She has been using silver alginate to this area without issues. She has a first met head ulcer and it is unclear how long this has been there as it was discovered in the ED earlier this month when she was being evaluated for another issue. Iodoflex has been used at this area. 4/29; patient presents for 1 week follow-up. She has been using silver alginate to the leg wounds and Iodoflex to the plantar foot wound. She has had this wrapped with Coban and Kerlix. She has no concerns or complaints today. 5/6; this is a difficult wound on the right plantar foot transmit site. We are not making a lot of progress. She had 2 more venous looking wounds on the right medial and right anterior lower leg 1 of which is healed. Her appointment with Dr. Gwenlyn Found is on 5/25 5/16; she did not tolerate the offloading shoe we gave her in fact she had a fall with an abrasion  on her right forearm she is now back in the modified small shoe which does not offload her foot properly. Her appoint with Dr. Gwenlyn Found is still on 5/25 we have been using Iodoflex. The area on the right anterior lower leg is healed 7/18; patient presents for follow-up however has not been seen in 2 months. She was last seen at the end of May. She had a fall at the end of June and  was hospitalized. She has been unable to follow-up since then. For the wound she has only been keeping it covered with gauze. She is scheduled for an aortogram with lower extremity runoff at the end of July. 7/29; patient presents for 2-week follow-up. She has home health and they have been changing her dressings. She denies any issues and has no complaints today. She states she had a procedure where they opened up one of her vessels in her legs. She denies signs of infection. 8/5; patient presents for follow-up. She was recently in the hospital for bradycardia. She was noted to have cellulitis to the left lower extremity and Unna boot was placed due to blisters and increased swelling. She reports improvement to her left leg since being in the hospital and stability check her right plantar foot wound. She denies signs of infection. 8/23; since I last saw this patient a lot has happened. She still has the area over the right first met head in the setting of previous transmetatarsal amputation. Firstly most importantly she underwent revascularization of her occluded right SFA. She underwent directional atherectomy followed by drug-coated balloon angioplasty. She has no named vessel below the knee hopefully the revascularization of the right SFA will improve collateral flow. It is not felt however that she has any endovascular options. She had follow-up arterial studies noninvasive on 8/9. These showed an ABI of 0.44 at the right PTA. Monophasic waveforms. On the left her great toe ABI was 0.68. Her follow-up arterial Doppler showed a 50 to 74% stenosis in the proximal SFA and a 50 to 74% stenosis in the distal SFA mild to severe atherosclerosis noted throughout the extremity. Areas of shadowing plaque seen, unable to rule out higher grade stenosis she also had a fall apparently wearing a right foot forefoot off loader that we gave her. She therefore comes in in an ordinary running shoe today. Not  certain if this is the fall that ended up in the hospital with bradycardia 9/6 area over the right first metatarsal head in the setting of her previous amputation and severe PAD. She is also a diabetic with known PAD status post attempt at revascularization. She is continued smoker Again the separation of visits in the clinic is somewhat disturbing if we are going to consider her for a total contact cast. She is not wearing anything to offload this stating it causes imbalance especially the forefoot off loader. She basically comes in in bedroom slippers She also had a fall this morning about an hour ago. She has a skin tear on the left dorsal forearm 9/13; areas over the right first metatarsal head in the setting of previous TMA and severe PAD. Again she comes in here in slippers. We have been using Hydrofera Blue. We use MolecuLight on this which was essentially negative study 9/20; right first metatarsal head in the setting of her previous TMA and severe PAD. Same slipper type shoes. She says she cannot wear a forefoot off loader and for some reason she will not wear surgical shoes. She says  she is smoking half a pack per day. 9/30; right first metatarsal head. Absolutely no improvement in fact there is tunneling superiorly very close to bone. She does not offload this properly at all. We use MolecuLight on this 2 weeks ago that did not show any surface bacteria we have been using silver collagen is a dressing She tells me she is down to 3 cigarettes a day I have offered encouragement and a treatment plan 10/6; right first metatarsal head. Exposed bone this week which is not surprising. We have been using silver collagen. She is smoking 5 to 10 cigarettes a day 10/17; she has not been here in 10 days. The bone scraping that I did on 10/6 showed staph lugdunensis, staph epidermidis and Pseudomonas Alcalifenes. I put in for Septra DS 1 p.o. twice daily for 14 days last week would be but we could not  reach her to start the antibiotic. Quinolones would have been a good alternative except she has multiple drug interactions. Septra would not cover what ever the Pseudomonas is but I am not really sure of the relevance here. I am going to try her on the Septra for 2 weeks. She has a probing wound on the right TMA that probes to bone. She claims to have just about stop smoking I reviewed her arterial status. She underwent a left fem-tib bypass by Dr. Donnetta Hutching in 06/28/2019. Her last angiogram was in July of this year by Dr. Gwenlyn Found. This showed an occluded right SFA the popliteal and tibial vessels were also occluded the peroneal vessel filled by collaterals and filled the PT and DP by communicating collaterals. She was not felt to have any additional and vascular options. Dr. Gwenlyn Found noted that she he had previously revascularized her right SFA 3 times. 10/25; she is tolerating the Septra but says it makes her nauseated. I am going to continue this for an additional 2 weeks perhaps for a total of 6 weeks. Her wound does not look too much different. There is on the right first met metatarsal head. There is probing areas around the tissue that is in the middle of the wound. There is no purulence I cannot feel bone. The original scepter was for a bone scraping. 11/8; right first metatarsal head. This does not probe to bone and there is no purulence. As far as I can tell she is completed the Septra although there were problems with exact length of time and I think compliance. In any case I gave her 6 weeks. I have reviewed her PAD above. Objective Constitutional Patient is hypertensive.. Pulse regular and within target range for patient.[name] Respirations regular, non-labored and within target range.. Temperature is normal and within the target range for the patient.[name] Appears in no distress. Vitals Time Taken: 8:39 AM, Height: 64 in, Weight: 222 lbs, BMI: 38.1, Temperature: 98.2 F, Pulse: 99 bpm, Respiratory Rate:  20 breaths/min, Blood Pressure: 152/83 mmHg, Capillary Blood Glucose: 169 mg/dl. General Notes: Wound exam; right first metatarsal head. Again the wound itself is small and appears to be filled with tissue however there is probing on either side of this with undermining. Direct depth 2.5 cm. I did not feel any debridement was necessary. There was no palpable bone. No purulence. Integumentary (Hair, Skin) Wound #16 status is Open. Original cause of wound was Gradually Appeared. The date acquired was: 02/05/2021. The wound has been in treatment 30 weeks. The wound is located on the Right,Plantar Amputation Site - Transmetatarsal. The wound measures 0.7cm length  x 0.8cm width x 0.5cm depth; 0.44cm^2 area and 0.22cm^3 volume. There is Fat Layer (Subcutaneous Tissue) exposed. There is no tunneling noted, however, there is undermining starting at 9:00 and ending at 3:00 with a maximum distance of 0.2cm. There is a medium amount of serosanguineous drainage noted. The wound margin is thickened. There is large (67-100%) pink, pale granulation within the wound bed. There is a small (1-33%) amount of necrotic tissue within the wound bed including Adherent Slough. General Notes: Calloused Periwound Assessment Active Problems ICD-10 Non-pressure chronic ulcer of other part of right foot with other specified severity Type 2 diabetes mellitus with foot ulcer Other chronic osteomyelitis, right ankle and foot Type 2 diabetes mellitus with diabetic peripheral angiopathy without gangrene Venous insufficiency (chronic) (peripheral) Plan Follow-up Appointments: Return Appointment in 1 week. - with Dr. Arcola Jansky Shower/ Hygiene: May shower with protection but do not get wound dressing(s) wet. Edema Control - Lymphedema / SCD / Other: Elevate legs to the level of the heart or above for 30 minutes daily and/or when sitting, a frequency of: - throughout the day Avoid standing for long periods of time. Patient  to wear own compression stockings every day. - left leg Exercise regularly Off-Loading: Open toe surgical shoe to: - with felt padding to offload Additional Orders / Instructions: Stop/Decrease Smoking Follow Nutritious Diet WOUND #16: - Amputation Site - Transmetatarsal Wound Laterality: Plantar, Right Cleanser: Soap and Water 1 x Per Week/7 Days Discharge Instructions: May shower and wash wound with dial antibacterial soap and water prior to dressing change. Cleanser: Wound Cleanser 1 x Per Week/7 Days Discharge Instructions: Cleanse the wound with wound cleanser or normal saline prior to applying a clean dressing using gauze sponges, not tissue or cotton balls. Peri-Wound Care: Sween Lotion (Moisturizing lotion) (Home Health) 1 x Per Week/7 Days Discharge Instructions: Apply moisturizing lotion as directed Prim Dressing: IODOFLEX 0.9% Cadexomer Iodine Pad 4x6 cm 1 x Per Week/7 Days ary Discharge Instructions: Apply to wound bed as instructed Secondary Dressing: Woven Gauze Sponge, Non-Sterile 4x4 in 1 x Per Week/7 Days Discharge Instructions: Apply over primary dressing as directed. Secondary Dressing: ABD Pad, 5x9 1 x Per Week/7 Days Discharge Instructions: Apply over primary dressing as directed. Secondary Dressing: Optifoam Non-Adhesive Dressing, 4x4 in 1 x Per Week/7 Days Discharge Instructions: Foam donut to help offload Com pression Wrap: Kerlix Roll 4.5x3.1 (in/yd) 1 x Per Week/7 Days Discharge Instructions: Apply Kerlix and Coban compression as directed. Compression Wrap: Coban Self-Adherent Wrap 4x5 (in/yd) 1 x Per Week/7 Days Discharge Instructions: Apply over Kerlix as directed. #1 I am continuing with the Iodoflex under kerlix and Coban compression. 2. The patient has significant chronic venous insufficiency as well. 3. Offloading this area is problematic she is in a surgical shoe. She has had a previous TMA. 4. Her attendance in clinic has been intermittent I am not  sure I could justify a total contact cast on this basis 5. She has completed 6 weeks of trimethoprim/sulfamethoxazole. Her last potassium check was 4.3 she tolerated this well although I am not exactly sure about her compliance whether there were gaps in therapy or not I am not certain. At this point I am not planning to give her any additional antibiotics. 6. She has significant PAD and has been revascularized. I do not believe she is felt to have an option here for revascularization either endovascularly or with bypass surgery. Electronic Signature(s) Signed: 09/21/2021 4:39:28 PM By: Linton Ham MD Entered By: Linton Ham on  09/21/2021 09:41:20 -------------------------------------------------------------------------------- SuperBill Details Patient Name: Date of Service: [name] 09/21/2021 Medical Record Number: [id number] Patient Account Number: [id number] Date of Birth/Sex: Treating RN: 07-22-1961 (60 y.o. [name] Primary Care Provider: Charlott Rakes Other Clinician: Referring Provider: Treating Provider/Extender: Sindy Guadeloupe Weeks in Treatment: 30 Diagnosis Coding ICD-10 Codes Code Description [phone] Non-pressure chronic ulcer of other part of right foot with other specified severity 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 2 diabetes mellitus with foot ulcer M86.671 Other chronic osteomyelitis, right ankle and foot E11.51 Type 2 diabetes mellitus with diabetic peripheral angiopathy without gangrene I87.2 Venous insufficiency (chronic) (peripheral) Facility Procedures CPT4 Code: [id number] Description: 99213 - WOUND CARE VISIT-LEV 3 EST PT Modifier: Quantity: 1 Physician Procedures : CPT4 Code Description Modifier [id number] - WC PHYS LEVEL 4 - EST PT ICD-10 Diagnosis Description L97.518 Non-pressure chronic ulcer of other part of right foot with other specified severity 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 2 diabetes mellitus with foot ulcer M86.671  Other chronic  osteomyelitis, right ankle and foot E11.51 Type 2 diabetes mellitus with diabetic peripheral angiopathy without gangrene Quantity: 1 Electronic Signature(s) Signed: 09/21/2021 4:39:28 PM By: Linton Ham MD Entered By: Linton Ham on 09/21/2021 09:41:45

## 2021-09-22 ENCOUNTER — Ambulatory Visit: Payer: Self-pay | Admitting: *Deleted

## 2021-09-22 NOTE — Telephone Encounter (Signed)
Patient called with cough and sneezing and throat. She wants to know what she can get for it. Please call back   Attempted to call patient- left message to call office

## 2021-09-22 NOTE — Telephone Encounter (Signed)
Second attempt to reach patient- left message to call office °

## 2021-09-22 NOTE — Telephone Encounter (Signed)
Pt called back, stating she has been coughing for [redacted] weeks along with sneezing and now hoarse. States that she is a smoker with COPD but doesn't think that's what causing her coughing. She says that she coughs so much cause she gets a dry tickle in her throat and then sneezes after she coughs. Says she has thin clear sputum that she coughs up. Also reports L earache. During call pt also states that she fell couple times a few weeks back and forgot to call to see about getting an appt. Pt was performing a covid home test during call as well that was negative. I scheduled pt for OV appt tomorrow 09/23/21 at 1530 with Levada Dy, NP. Care advice given and pt verbalized understanding. No other questions/concerns noted.    Reason for Disposition  Cough has been present for > 3 weeks  Answer Assessment - Initial Assessment Questions 1. ONSET: "When did the cough begin?"      2 weeks ago 2. SEVERITY: "How bad is the cough today?"      Pretty bad 3. SPUTUM: "Describe the color of your sputum" (none, dry cough; clear, white, yellow, green)     Clear, thin 4. HEMOPTYSIS: "Are you coughing up any blood?" If so ask: "How much?" (flecks, streaks, tablespoons, etc.)     No 5. DIFFICULTY BREATHING: "Are you having difficulty breathing?" If Yes, ask: "How bad is it?" (e.g., mild, moderate, severe)    - MILD: No SOB at rest, mild SOB with walking, speaks normally in sentences, can lie down, no retractions, pulse < 100.    - MODERATE: SOB at rest, SOB with minimal exertion and prefers to sit, cannot lie down flat, speaks in phrases, mild retractions, audible wheezing, pulse 100-120.    - SEVERE: Very SOB at rest, speaks in single words, struggling to breathe, sitting hunched forward, retractions, pulse > 120      no 6. FEVER: "Do you have a fever?" If Yes, ask: "What is your temperature, how was it measured, and when did it start?"     no 7. CARDIAC HISTORY: "Do you have any history of heart disease?" (e.g., heart  attack, congestive heart failure)      CHF 8. LUNG HISTORY: "Do you have any history of lung disease?"  (e.g., pulmonary embolus, asthma, emphysema)     COPD 9. PE RISK FACTORS: "Do you have a history of blood clots?" (or: recent major surgery, recent prolonged travel, bedridden)     Yes 10. OTHER SYMPTOMS: "Do you have any other symptoms?" (e.g., runny nose, wheezing, chest pain)       Earache left Ear, hoarse 11. PREGNANCY: "Is there any chance you are pregnant?" "When was your last menstrual period?"       NO 12. TRAVEL: "Have you traveled out of the country in the last month?" (e.g., travel history, exposures)       No  Protocols used: Cough - Acute Productive-A-AH

## 2021-09-22 NOTE — Progress Notes (Signed)
**Note Karla-Identified via Obfuscation** [name], [name] ([id number]) Visit Report for 09/21/2021 Arrival Information Details Patient Name: Date of Service: [name] 09/21/2021 8:45 A M Medical Record Number: [id number] Patient Account Number: [id number] Date of Birth/Sex: Treating RN: [birth year] (60 y.o. [name], [name] Primary Care [name]: Charlott Rakes Other Clinician: Referring Janellie Tennison: Treating Johnathan Heskett/Extender: Ramon Dredge in Treatment: 30 Visit Information History Since Last Visit Added or deleted any medications: No Patient Arrived: [name] Any new allergies or adverse reactions: No Arrival Time: 08:39 Had a fall or experienced change in No Accompanied By: self activities of daily living that may affect Transfer Assistance: Manual risk of falls: Patient Identification Verified: Yes Signs or symptoms of abuse/neglect since last visito No Secondary Verification Process Completed: Yes Hospitalized since last visit: No Patient Requires Transmission-Based Precautions: No Implantable device outside of the clinic excluding No Patient Has Alerts: No cellular tissue based products placed in the center since last visit: Has Dressing in Place as Prescribed: Yes Pain Present Now: Yes Electronic Signature(s) Signed: 09/22/2021 1:53:08 PM By: Sandre Kitty Entered By: Sandre Kitty on 09/21/2021 08:39:48 -------------------------------------------------------------------------------- Clinic Level of Care Assessment Details Patient Name: Date of Service: [name] 09/21/2021 8:45 A M Medical Record Number: [id number] Patient Account Number: [id number] Date of Birth/Sex: Treating RN: [birth year] (60 y.o. [name] Primary Care Balinda Heacock: Charlott Rakes Other Clinician: Referring Orrie Schubert: Treating Coal Nearhood/Extender: Ramon Dredge in Treatment: 30 Clinic Level of Care Assessment Items TOOL 4 Quantity Score X- 1 0 Use when only an  EandM is performed on FOLLOW-UP visit ASSESSMENTS - Nursing Assessment / Reassessment X- 1 10 Reassessment of Co-morbidities (includes updates in patient status) X- 1 5 Reassessment of Adherence to Treatment Plan ASSESSMENTS - Wound and Skin A ssessment / Reassessment X - Simple Wound Assessment / Reassessment - one wound 1 5 []  - 0 Complex Wound Assessment / Reassessment - multiple wounds []  - 0 Dermatologic / Skin Assessment (not related to wound area) ASSESSMENTS - Focused Assessment []  - 0 Circumferential Edema Measurements - multi extremities []  - 0 Nutritional Assessment / Counseling / Intervention []  - 0 Lower Extremity Assessment (monofilament, tuning fork, pulses) []  - 0 Peripheral Arterial Disease Assessment (using hand held doppler) ASSESSMENTS - Ostomy and/or Continence Assessment and Care []  - 0 Incontinence Assessment and Management []  - 0 Ostomy Care Assessment and Management (repouching, etc.) PROCESS - Coordination of Care []  - 0 Simple Patient / Family Education for ongoing care X- 1 20 Complex (extensive) Patient / Family Education for ongoing care []  - 0 Staff obtains Programmer, systems, Records, T Results / Process Orders est []  - 0 Staff telephones HHA, Nursing Homes / Clarify orders / etc []  - 0 Routine Transfer to another Facility (non-emergent condition) []  - 0 Routine Hospital Admission (non-emergent condition) []  - 0 New Admissions / Biomedical engineer / Ordering NPWT Apligraf, etc. , []  - 0 Emergency Hospital Admission (emergent condition) []  - 0 Simple Discharge Coordination []  - 0 Complex (extensive) Discharge Coordination PROCESS - Special Needs []  - 0 Pediatric / Minor Patient Management []  - 0 Isolation Patient Management []  - 0 Hearing / Language / Visual special needs []  - 0 Assessment of Community assistance (transportation, D/C planning, etc.) []  - 0 Additional assistance / Altered mentation []  - 0 Support Surface(s)  Assessment (bed, cushion, seat, etc.) INTERVENTIONS - Wound Cleansing / Measurement X - Simple Wound Cleansing - one wound 1 5 []  - 0 Complex Wound Cleansing - multiple  wounds X- 1 5 Wound Imaging (photographs - any number of wounds) []  - 0 Wound Tracing (instead of photographs) X- 1 5 Simple Wound Measurement - one wound []  - 0 Complex Wound Measurement - multiple wounds INTERVENTIONS - Wound Dressings []  - 0 Small Wound Dressing one or multiple wounds []  - 0 Medium Wound Dressing one or multiple wounds X- 1 20 Large Wound Dressing one or multiple wounds []  - 0 Application of Medications - topical []  - 0 Application of Medications - injection INTERVENTIONS - Miscellaneous []  - 0 External ear exam []  - 0 Specimen Collection (cultures, biopsies, blood, body fluids, etc.) []  - 0 Specimen(s) / Culture(s) sent or taken to Lab for analysis []  - 0 Patient Transfer (multiple staff / Civil Service fast streamer / Similar devices) []  - 0 Simple Staple / Suture removal (25 or less) []  - 0 Complex Staple / Suture removal (26 or more) []  - 0 Hypo / Hyperglycemic Management (close monitor of Blood Glucose) []  - 0 Ankle / Brachial Index (ABI) - do not check if billed separately X- 1 5 Vital Signs Has the patient been seen at the hospital within the last three years: Yes Total Score: 80 Level Of Care: New/Established - Level 3 Electronic Signature(s) Signed: 09/21/2021 4:51:17 PM By: Lorrin Jackson Entered By: Lorrin Jackson on 09/21/2021 09:19:18 -------------------------------------------------------------------------------- Encounter Discharge Information Details Patient Name: Date of Service: Karla Corwin, DO Karla S. 09/21/2021 8:45 A M Medical Record Number: [id number] Patient Account Number: [id number] Date of Birth/Sex: Treating RN: 12-01-1960 (60 y.o. [name] Primary Care Teegan Brandis: Charlott Rakes Other Clinician: Referring Jadon Ressler: Treating Shylyn Younce/Extender: Ramon Dredge in Treatment: 30 Encounter Discharge Information Items Discharge Condition: Stable Ambulatory Status: Walker Discharge Destination: Home Transportation: Private Auto Schedule Follow-up Appointment: Yes Clinical Summary of Care: Provided on 09/21/2021 Form Type Recipient Paper Patient Patient Electronic Signature(s) Signed: 09/21/2021 4:51:17 PM By: Lorrin Jackson Entered By: Lorrin Jackson on 09/21/2021 09:34:05 -------------------------------------------------------------------------------- Lower Extremity Assessment Details Patient Name: Date of Service: Karla Parcel S. 09/21/2021 8:45 A M Medical Record Number: [id number] Patient Account Number: [id number] Date of Birth/Sex: Treating RN: [birth year] (60 y.o. [name] Primary Care Celestine Prim: Charlott Rakes Other Clinician: Referring Charise Leinbach: Treating Kysa Calais/Extender: Sindy Guadeloupe Weeks in Treatment: 30 Edema Assessment Assessed: [Left: No] [Right: Yes] Edema: [Left: Ye] [Right: s] Calf Left: Right: Point of Measurement: From Medial Instep 43.5 cm Ankle Left: Right: Point of Measurement: From Medial Instep 22.6 cm Vascular Assessment Pulses: Dorsalis Pedis Palpable: [Right:Yes] Electronic Signature(s) Signed: 09/21/2021 4:51:17 PM By: Lorrin Jackson Entered By: Lorrin Jackson on 09/21/2021 08:47:20 -------------------------------------------------------------------------------- Multi Wound Chart Details Patient Name: Date of Service: Karla Corwin, DO Karla S. 09/21/2021 8:45 A M Medical Record Number: [id number] Patient Account Number: [id number] Date of Birth/Sex: Treating RN: 01-25-1961 (60 y.o. [name], [name] Primary Care Chase Arnall: Charlott Rakes Other Clinician: Referring Arseniy Toomey: Treating Cleotha Tsang/Extender: Sindy Guadeloupe Weeks in Treatment: 30 Vital Signs Height(in): 64 Capillary Blood Glucose(mg/dl): 169 Weight(lbs):  222 Pulse(bpm): 75 Body Mass Index(BMI): 75 Blood Pressure(mmHg): 152/83 Temperature(F): 98.2 Respiratory Rate(breaths/min): 20 Photos: [N/A:N/A] Right, Plantar Amputation Site - N/A N/A Wound Location: Transmetatarsal Gradually Appeared N/A N/A Wounding Event: Diabetic Wound/Ulcer of the Lower N/A N/A Primary Etiology: Extremity Chronic Obstructive Pulmonary N/A N/A Comorbid History: Disease (COPD), Sleep Apnea, Arrhythmia, Congestive Heart Failure, Coronary Artery Disease, Hypertension, Peripheral Arterial Disease, Cirrhosis , Type II Diabetes, Osteoarthritis, Neuropathy 02/05/2021 N/A N/A Date Acquired: 30 N/A N/A Weeks of Treatment: Open  N/A N/A Wound Status: 0.7x0.8x0.5 N/A N/A Measurements L x W x D (cm) 0.44 N/A N/A A (cm) : rea 0.22 N/A N/A Volume (cm) : 73.10% N/A N/A % Reduction in A rea: 55.10% N/A N/A % Reduction in Volume: 9 Starting Position 1 (o'clock): 3 Ending Position 1 (o'clock): 0.2 Maximum Distance 1 (cm): Yes N/A N/A Undermining: Grade 2 N/A N/A Classification: Medium N/A N/A Exudate A mount: Serosanguineous N/A N/A Exudate Type: red, brown N/A N/A Exudate Color: Thickened N/A N/A Wound Margin: Large (67-100%) N/A N/A Granulation A mount: Pink, Pale N/A N/A Granulation Quality: Small (1-33%) N/A N/A Necrotic A mount: Fat Layer (Subcutaneous Tissue): Yes N/A N/A Exposed Structures: Fascia: No Tendon: No Muscle: No Joint: No Bone: No None N/A N/A Epithelialization: Calloused Periwound N/A N/A Assessment Notes: Treatment Notes 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 Wound Cleanser Discharge Instruction: Cleanse the wound with wound cleanser or normal saline prior to applying a clean dressing using gauze sponges, not tissue or cotton  balls. Peri-Wound Care Sween Lotion (Moisturizing lotion) Discharge Instruction: Apply moisturizing lotion as directed Topical Primary Dressing IODOFLEX 0.9% Cadexomer Iodine Pad 4x6 cm Discharge Instruction: Apply to wound bed as instructed Secondary Dressing Woven Gauze Sponge, Non-Sterile 4x4 in Discharge Instruction: Apply over primary dressing as directed. ABD Pad, 5x9 Discharge Instruction: Apply over primary dressing as directed. Optifoam Non-Adhesive Dressing, 4x4 in Discharge Instruction: Foam donut to help offload Secured With Compression Wrap Kerlix Roll 4.5x3.1 (in/yd) Discharge Instruction: Apply Kerlix and Coban compression as directed. Coban Self-Adherent Wrap 4x5 (in/yd) Discharge Instruction: Apply over Kerlix as directed. Compression Stockings Add-Ons Electronic Signature(s) Signed: 09/21/2021 4:39:28 PM By: Linton Ham MD Signed: 09/21/2021 4:45:49 PM By: [name] RN Entered By: Linton Ham on 09/21/2021 09:34:33 -------------------------------------------------------------------------------- Multi-Disciplinary Care Plan Details Patient Name: Date of Service: Karla Corwin, DO Karla S. 09/21/2021 8:45 A M Medical Record Number: [id number] Patient Account Number: [id number] Date of Birth/Sex: Treating RN: 1961-08-14 (60 y.o. [name] Primary Care Wreatha Sturgeon: Charlott Rakes Other Clinician: Referring Zeth Buday: Treating Jarid Sasso/Extender: Ramon Dredge in Treatment: [place] reviewed with physician Active Inactive Wound/Skin Impairment Nursing Diagnoses: Impaired tissue integrity Goals: Patient/caregiver will verbalize understanding of skin care regimen Date Initiated: 02/19/2021 Target Resolution Date: 10/08/2021 Goal Status: Active Ulcer/skin breakdown will have a volume reduction of 30% by week 4 Date Initiated: 02/19/2021 Date Inactivated: 03/29/2021 Target Resolution Date: 04/09/2021 Goal  Status: Unmet Unmet Reason: PAD Interventions: Assess patient/caregiver ability to obtain necessary supplies Assess patient/caregiver ability to perform ulcer/skin care regimen upon admission and as needed Assess ulceration(s) every visit Provide education on ulcer and skin care Treatment Activities: Skin care regimen initiated : 02/19/2021 Topical wound management initiated : 02/19/2021 Notes: Electronic Signature(s) Signed: 09/21/2021 4:51:17 PM By: Lorrin Jackson Entered By: Lorrin Jackson on 09/21/2021 08:46:00 -------------------------------------------------------------------------------- Pain Assessment Details Patient Name: Date of Service: Karla Parcel S. 09/21/2021 8:45 A M Medical Record Number: [id number] Patient Account Number: [id number] Date of Birth/Sex: Treating RN: 07/08/1961 (60 y.o. [name] Primary Care Reiana Poteet: Charlott Rakes Other Clinician: Referring [place] Pechacek: Treating Ensley Blas/Extender: Sindy Guadeloupe Weeks in Treatment: 30 Active Problems Location of Pain Severity and Description of Pain Patient Has Paino Yes Site Locations Rate the pain. Current Pain Level: 5 Pain Management and Medication Current Pain Management: Electronic Signature(s) Signed: 09/21/2021 4:45:49 PM  By: [name] RN Signed: 09/22/2021 1:53:08 PM By: Sandre Kitty Entered By: Sandre Kitty on 09/21/2021 08:40:21 -------------------------------------------------------------------------------- Patient/Caregiver Education Details Patient Name: Date of Service: [name] 11/8/2022andnbsp8:45 A M Medical Record Number: [id number] Patient Account Number: [id number] Date of Birth/Gender: Treating RN: 10/11/1961 (60 y.o. [name] Primary Care Physician: Charlott Rakes Other Clinician: Referring Physician: Treating Physician/Extender: Ramon Dredge in Treatment: 30 Education Assessment Education  Provided To: Patient Education Topics Provided Offloading: Methods: Explain/Verbal, Printed Responses: State content correctly Venous: Methods: Explain/Verbal, Printed Responses: State content correctly Wound/Skin Impairment: Methods: Explain/Verbal, Printed Responses: State content correctly Electronic Signature(s) Signed: 09/21/2021 4:51:17 PM By: Lorrin Jackson Entered By: Lorrin Jackson on 09/21/2021 08:46:27 -------------------------------------------------------------------------------- Wound Assessment Details Patient Name: Date of Service: Karla Corwin, DO Karla S. 09/21/2021 8:45 A M Medical Record Number: [id number] Patient Account Number: [id number] Date of Birth/Sex: Treating RN: 1961/05/14 (60 y.o. [name], [name] Primary Care Grae Leathers: Charlott Rakes Other Clinician: Referring Tamyia Minich: Treating Laticia Vannostrand/Extender: Sindy Guadeloupe Weeks in Treatment: 30 Wound Status Wound Number: 16 Primary Diabetic Wound/Ulcer of the Lower Extremity Etiology: Wound Location: Right, Plantar Amputation Site - Transmetatarsal Wound Open Wounding Event: Gradually Appeared Status: Date Acquired: 02/05/2021 Comorbid Chronic Obstructive Pulmonary Disease (COPD), Sleep Apnea, Weeks Of Treatment: 30 History: Arrhythmia, Congestive Heart Failure, Coronary Artery Disease, Clustered Wound: No Hypertension, Peripheral Arterial Disease, Cirrhosis , Type II Diabetes, Osteoarthritis, Neuropathy Photos Wound Measurements Length: (cm) 0.7 Width: (cm) 0.8 Depth: (cm) 0.5 Area: (cm) 0.44 Volume: (cm) 0.22 % Reduction in Area: 73.1% % Reduction in Volume: 55.1% Epithelialization: None Tunneling: No Undermining: Yes Starting Position (o'clock): 9 Ending Position (o'clock): 3 Maximum Distance: (cm) 0.2 Wound Description Classification: Grade 2 Wound Margin: Thickened Exudate Amount: Medium Exudate Type: Serosanguineous Exudate Color: red, brown Foul Odor After  Cleansing: No Slough/Fibrino Yes Wound Bed Granulation Amount: Large (67-100%) Exposed Structure Granulation Quality: Pink, Pale Fascia Exposed: No Necrotic Amount: Small (1-33%) Fat Layer (Subcutaneous Tissue) Exposed: Yes Necrotic Quality: Adherent Slough Tendon Exposed: No Muscle Exposed: No Joint Exposed: No Bone Exposed: No Assessment Notes Calloused Periwound Treatment Notes 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 Wound Cleanser Discharge Instruction: Cleanse the wound with wound cleanser or normal saline prior to applying a clean dressing using gauze sponges, not tissue or cotton balls. Peri-Wound Care Sween Lotion (Moisturizing lotion) Discharge Instruction: Apply moisturizing lotion as directed Topical Primary Dressing IODOFLEX 0.9% Cadexomer Iodine Pad 4x6 cm Discharge Instruction: Apply to wound bed as instructed Secondary Dressing Woven Gauze Sponge, Non-Sterile 4x4 in Discharge Instruction: Apply over primary dressing as directed. ABD Pad, 5x9 Discharge Instruction: Apply over primary dressing as directed. Optifoam Non-Adhesive Dressing, 4x4 in Discharge Instruction: Foam donut to help offload Secured With Compression Wrap Kerlix Roll 4.5x3.1 (in/yd) Discharge Instruction: Apply Kerlix and Coban compression as directed. Coban Self-Adherent Wrap 4x5 (in/yd) Discharge Instruction: Apply over Kerlix as directed. Compression Stockings Add-Ons Electronic Signature(s) Signed: 09/21/2021 4:45:49 PM By: [name] RN Signed: 09/21/2021 4:51:17 PM By: Lorrin Jackson Entered By: Lorrin Jackson on 09/21/2021 08:48:03 -------------------------------------------------------------------------------- [place] Details Patient Name: Date of Service: Karla Corwin, DO Karla S. 09/21/2021 8:45 A  M Medical Record Number: [id number] Patient Account Number: [id number] Date of Birth/Sex: Treating RN: 09/24/1961 (60 y.o. [name], [name] Primary Care Nilay Mangrum: Charlott Rakes Other Clinician: Referring Takita Riecke: Treating Daltyn Degroat/Extender: Kandice Robinsons, Charlane Ferretti  Weeks in Treatment: 30 Vital Signs Time Taken: 08:39 Temperature (F): 98.2 Height (in): 64 Pulse (bpm): 99 Weight (lbs): 222 Respiratory Rate (breaths/min): 20 Body Mass Index (BMI): 38.1 Blood Pressure (mmHg): 152/83 Capillary Blood Glucose (mg/dl): 169 Reference Range: 80 - 120 mg / dl Electronic Signature(s) Signed: 09/22/2021 1:53:08 PM By: Sandre Kitty Entered By: Sandre Kitty on 09/21/2021 08:40:12

## 2021-09-23 ENCOUNTER — Other Ambulatory Visit (HOSPITAL_COMMUNITY): Payer: Self-pay

## 2021-09-23 ENCOUNTER — Ambulatory Visit: Payer: Medicare Other | Attending: Physician Assistant | Admitting: Physician Assistant

## 2021-09-23 ENCOUNTER — Encounter: Payer: Self-pay | Admitting: Physician Assistant

## 2021-09-23 ENCOUNTER — Other Ambulatory Visit: Payer: Self-pay

## 2021-09-23 VITALS — BP 134/67 | HR 73 | Resp 16 | Wt 214.2 lb

## 2021-09-23 DIAGNOSIS — J40 Bronchitis, not specified as acute or chronic: Secondary | ICD-10-CM

## 2021-09-23 DIAGNOSIS — E038 Other specified hypothyroidism: Secondary | ICD-10-CM

## 2021-09-23 DIAGNOSIS — R059 Cough, unspecified: Secondary | ICD-10-CM | POA: Diagnosis not present

## 2021-09-23 DIAGNOSIS — E1151 Type 2 diabetes mellitus with diabetic peripheral angiopathy without gangrene: Secondary | ICD-10-CM | POA: Diagnosis not present

## 2021-09-23 LAB — GLUCOSE, POCT (MANUAL RESULT ENTRY): POC Glucose: 121 mg/dl — AB (ref 70–99)

## 2021-09-23 MED ORDER — BENZONATATE 100 MG PO CAPS: 100.0000 mg | ORAL_CAPSULE | Freq: Two times a day (BID) | ORAL | 0 refills | Status: DC | PRN

## 2021-09-23 MED ORDER — AZITHROMYCIN 250 MG PO TABS: ORAL_TABLET | ORAL | 0 refills | Status: AC

## 2021-09-23 NOTE — Progress Notes (Signed)
Paramedicine Encounter    Patient ID: [name], female    DOB: [birth year], 60 y.o.   MRN: [id number]   Patient Care Team: Charlott Rakes, MD as PCP - General (Family Medicine) [name], MD as PCP - Cardiology (Cardiology) [name], MD as PCP - Electrophysiology (Cardiology) [name], MD as Consulting Physician (Cardiology) [name], MD as Consulting Physician (Pulmonary Disease) System, Provider Not In  Patient Active Problem List   Diagnosis Date Noted   Hyperlipidemia associated with type 2 diabetes mellitus ([place]) 07/29/2021   (HFpEF) heart failure with preserved ejection fraction ([place]) 07/29/2021   Bradycardia 06/13/2021   Uncontrolled type 2 diabetes mellitus with hyperglycemia ([place]) 06/13/2021   Acute kidney injury superimposed on CKD ([place]) 06/13/2021   Critical ischemia of foot ([place]) 06/07/2021   Fall 05/05/2021   PAF (paroxysmal atrial fibrillation) ([place]) 05/05/2021   COPD (chronic obstructive pulmonary disease) ([place]) 05/05/2021   DM (diabetes mellitus), type 2 with complications ([place]) 32/99/2426   PAD (peripheral artery disease) ([place]) 05/05/2021   Cellulitis 05/05/2021   Acute on chronic combined systolic and diastolic CHF (congestive heart failure) ([place]) 05/05/2021   OSA (obstructive sleep apnea) 05/05/2021   CKD (chronic kidney disease) stage 3, GFR 30-59 ml/min ([place]) 05/05/2021   AKI (acute kidney injury) ([place]) 05/04/2021   Pressure injury of skin 05/04/2021   Ulcer of foot, unspecified laterality, limited to breakdown of skin ([place]) 02/17/2021   Altered mental status 01/02/2021   AF (paroxysmal atrial fibrillation) ([place]) 01/02/2021   CAD (coronary artery disease) 01/02/2021   PVD (peripheral vascular disease) ([place]) 01/02/2021   Acute renal failure superimposed on stage 4 chronic kidney disease ([place]) 01/02/2021   Diabetes mellitus type 2, uncontrolled, with complications 83/41/9622   Diabetic nephropathy ([place]) 01/02/2021   Low  back pain 06/01/2020   Hyperlipidemia 04/03/2020   Muscle weakness 03/20/2020   Closed fracture of neck of right radius 03/13/2020   Pain in elbow 03/12/2020   PAD (peripheral artery disease) ([place]) 12/26/2019   Acute renal failure ([place])    Acute respiratory failure with hypoxia ([place]) 12/02/2019   Acute exacerbation of CHF (congestive heart failure) ([place]) 12/01/2019   Cellulitis in diabetic foot ([place]) 07/08/2019   Osteomyelitis ([place]) 06/21/2019   NICM (nonischemic cardiomyopathy) ([place]) 06/20/2019   Non-healing ulcer ([place]) 06/20/2019   Acute CHF (congestive heart failure) ([place]) 11/26/2018   CHF (congestive heart failure) ([place]) 11/26/2018   Acute respiratory failure ([place]) 10/21/2018   AKI (acute kidney injury) ([place]) 08/18/2018   Coagulation disorder ([place]) 08/09/2017   Depression 07/21/2017   At risk for adverse drug reaction 06/20/2017   Peripheral neuropathy 06/20/2017   Acute osteomyelitis of right foot ([place]) 06/13/2017   S/P transmetatarsal amputation of foot, right ([name]) 06/05/2017   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 ([place]) 05/19/2017   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[place])    SIRS (systemic inflammatory response syndrome) ([place]) 04/06/2017   CKD (chronic kidney disease), stage III ([place]) 11/24/2016   Suspected sleep apnea 11/24/2016   Ulcer of toe of right foot, with necrosis of bone ([place]) 10/27/2016   Ulcer of left lower leg ([place]) 05/19/2016   Severe obesity (BMI >= 40) ([place]) 02/24/2016   COPD GOLD 0  02/24/2016   Morbid obesity ([place]) 02/24/2016   Encounter for therapeutic drug monitoring 02/10/2016   Symptomatic bradycardia 01/12/2016   Hypertension associated with diabetes ([place]) 12/22/2015   Chronic combined systolic and diastolic CHF (congestive heart failure) (  [place])    [place]    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 11/08/2015   Tobacco abuse 10/23/2015   Coronary artery disease    DOE (dyspnea on exertion) 04/29/2015    Diabetes mellitus type 2, uncontrolled 02/08/2015   Influenza A 02/07/2015   Wrist fracture, left, with routine healing, subsequent encounter 02/05/2015   Wrist fracture, left, closed, initial encounter 01/29/2015   PAF (paroxysmal atrial fibrillation) ([place]) 01/16/2015   Carotid arterial disease ([place]) 01/16/2015   Claudication ([place]) 01/15/2015   Demand ischemia ([place]) 10/29/2014   Insomnia 02/03/2014   COPD with acute exacerbation ([place]) 02/01/2014   S/P peripheral artery angioplasty - [place]bo[place] atherectomy; R SFA 09/11/2013    Class: Acute   Leg pain, bilateral 08/19/2013   Hypothyroidism 07/31/2013   Cellulitis 06/13/2013   History of cocaine abuse ([place]) 06/13/2013   Long term current use of anticoagulant therapy 05/20/2013   Alcohol abuse    Narcotic abuse ([place])    Marijuana abuse    Alcoholic cirrhosis ([name])    DM (diabetes mellitus), type 2 with peripheral vascular complications ([name])     Current Outpatient Medications:    ACCU-CHEK GUIDE test strip, USE TO CHECK BLOOD SUGAR FOUR TIMES DAILY, Disp: , Rfl:    acetaminophen (TYLENOL) 500 MG tablet, Take 500 mg by mouth every 6 (six) hours as needed for moderate pain., Disp: , Rfl:    albuterol (VENTOLIN HFA) 108 (90 Base) MCG/ACT inhaler, Inhale 2 puffs into the lungs every 6 (six) hours as needed for wheezing or shortness of breath., Disp: 18 g, Rfl: 3   allopurinol (ZYLOPRIM) 100 MG tablet, Take 1 tablet (100 mg total) by mouth daily., Disp: 30 tablet, Rfl: 6   amiodarone (PACERONE) 200 MG tablet, TAKE 1/2 TABLET (100 MG TOTAL) BY MOUTH DAILY., Disp: 15 tablet, Rfl: 3   amLODipine (NORVASC) 10 MG tablet, Take 1 tablet (10 mg total) by mouth daily., Disp: 90 tablet, Rfl: 3   apixaban (ELIQUIS) 5 MG TABS tablet, Take 5 mg by mouth 2 (two) times daily., Disp: , Rfl:    atorvastatin (LIPITOR) 40 MG tablet, Take 40 mg by mouth every evening., Disp: , Rfl:    budesonide-formoterol (SYMBICORT) 160-4.5 MCG/ACT inhaler, Inhale 2 puffs into  the lungs 2 (two) times daily., Disp: 1 Inhaler, Rfl: 2   clopidogrel (PLAVIX) 75 MG tablet, TAKE 1 TABLET (75 MG TOTAL) BY MOUTH DAILY., Disp: 30 tablet, Rfl: 11   colchicine 0.6 MG tablet, Take 2 tabs (1.2 mg) at the onset of a gout flare, may take 1 tab (0.6 mg) in 1 hour if symptoms, Disp: 30 tablet, Rfl: 3   Dulaglutide (TRULICITY) 1.5 EL/3.8BO SOPN, Inject 1.5 mg into the skin once a week., Disp: 2 mL, Rfl: 6   empagliflozin (JARDIANCE) 10 MG TABS tablet, Take 1 tablet (10 mg total) by mouth daily before breakfast., Disp: 90 tablet, Rfl: 3   FLUoxetine (PROZAC) 20 MG capsule, Take 1 capsule (20 mg total) by mouth at bedtime., Disp: 90 capsule, Rfl: 1   folic acid (FOLVITE) 1 MG tablet, Take 1 tablet (1 mg total) by mouth daily., Disp: 90 tablet, Rfl: 1   hydrALAZINE (APRESOLINE) 50 MG tablet, Take 1 tablet (50 mg total) by mouth 3 (three) times daily., Disp: 90 tablet, Rfl: 6   insulin glargine (LANTUS) 100 UNIT/ML Solostar Pen, Inject 30 Units into the skin daily., Disp: 30 mL, Rfl: 6   insulin lispro (HUMALOG) 100 UNIT/ML KwikPen, Inject 8 Units into the skin 3 (three)  times daily with meals., Disp: 15 mL, Rfl: 0   Insulin Pen Needle 32G X 4 MM MISC, USE AS DIRECTED WITH INSULIN PENS, Disp: 100 each, Rfl: 0   levothyroxine (SYNTHROID, LEVOTHROID) 50 MCG tablet, Take 1 tablet (50 mcg total) by mouth daily., Disp: 30 tablet, Rfl: 0   losartan (COZAAR) 25 MG tablet, Take 2 tablets (50 mg total) by mouth daily. (Patient taking differently: Take 50 mg by mouth at bedtime.), Disp: 180 tablet, Rfl: 1   nortriptyline (PAMELOR) 10 MG capsule, Take 1 capsule (10 mg total) by mouth every other day for 14 days, THEN 1 capsule (10 mg total) 2 (two) times a week for 14 days. Then discontinue., Disp: 11 capsule, Rfl: 0   Omega-3 Fatty Acids (FISH OIL PO), Take 1 capsule by mouth daily., Disp: , Rfl:    ondansetron (ZOFRAN) 4 MG tablet, Take 1 tablet (4 mg total) by mouth every 8 (eight) hours as needed for  nausea or vomiting., Disp: 20 tablet, Rfl: 0   pantoprazole (PROTONIX) 40 MG tablet, Take 1 tablet (40 mg total) by mouth daily., Disp:  , Rfl:    sulfamethoxazole-trimethoprim (BACTRIM DS) 800-160 MG tablet, Take 1 tablet by mouth 2 (two) times daily., Disp: , Rfl:    torsemide (DEMADEX) 20 MG tablet, Take 60-80 mg by mouth See admin instructions. Take 4 tablets by mouth in the morning, then take 3 tablets by mouth in the afternoon per patient, Disp: , Rfl:    VITAMIN D PO, Take 1 tablet by mouth daily., Disp: , Rfl:    benzonatate (TESSALON) 100 MG capsule, Take 1 capsule (100 mg total) by mouth 2 (two) times daily as needed for cough. (Patient not taking: No sig reported), Disp: 20 capsule, Rfl: 0   Continuous Blood Gluc Sensor (FREESTYLE LIBRE 2 SENSOR) MISC, USE 1 (ONE) EACH EVERY 2 WEEKS (Patient not taking: No sig reported), Disp: 2 each, Rfl: 11   Misc. Devices MISC, 2 L oxygen continuously via nasal cannula.  Diagnosis chronic respiratory failure with hypoxia (Patient not taking: No sig reported), Disp: 1 each, Rfl: 0   predniSONE (DELTASONE) 20 MG tablet, Take 2 tablets (40 mg total) by mouth daily with breakfast. (Patient not taking: No sig reported), Disp: 8 tablet, Rfl: 0 Allergies  Allergen Reactions   Gabapentin Nausea And Vomiting and Other (See Comments)    POSSIBLE SHAKING   Lyrica [Pregabalin] Other (See Comments)    Shaking       Social History   Socioeconomic History   Marital status: Single    Spouse name: Not on file   Number of children: 1   Years of education: 12   Highest education level: 12th grade  Occupational History   Occupation: disabled  Tobacco Use   Smoking status: Every Day    Packs/day: 1.00    Years: 44.00    Pack years: 44.00    Types: E-cigarettes, Cigarettes   Smokeless tobacco: Former    Types: Snuff  Vaping Use   Vaping Use: Former   Devices: 11/26/2018 "stopped months ago"  Substance and Sexual Activity   Alcohol use: Yes     Comment: occ   Drug use: Yes    Types: "Crack" cocaine, Marijuana, Oxycodone   Sexual activity: Not Currently  Other Topics Concern   Not on file  Social History Narrative   ** Merged History Encounter **       Lives in [place], in motel with sister.  They are looking  to move but don't have a place to go yet.     Social Determinants of Health   Financial Resource Strain: Not on file  Food Insecurity: Not on file  Transportation Needs: Not on file  Physical Activity: Not on file  Stress: Not on file  Social Connections: Not on file  Intimate Partner Violence: Not on file    Physical Exam      Future Appointments  Date Time Provider [place]  09/23/2021  3:30 PM Argentina Donovan, [place] [facility] None  09/28/2021  8:45 AM Ricard Dillon, MD [facility]  10/01/2021  2:30 PM [name], MD [facility]  10/21/2021  1:40 PM Larey Dresser, MD [facility] None  11/16/2021  3:30 PM Charlott Rakes, MD [facility] None  12/22/2021  1:00 PM [facility] VASC 3 [facility]    BP 140/62   Pulse 88   Resp 18   Wt 226 lb (102.5 kg)   LMP  (LMP Unknown)   SpO2 95%   BMI 38.79 kg/m  CMB PTA-148 Weight yesterday-226  Last visit weight-222  Pt report she is doing ok.  She feels a little depressed--about life in general, feels like things are going backwards instead of moving forward.  She feels like she has slight cold symptoms. Going to PCP today.  Pt denies increased sob, no dizziness, no c/p.  She had 2 falls over past 2 wks where she tripped and fell. No syncopal fall. She did get evaluated at ER.  She reports having trouble getting her freestyle-she is going to discuss with PCP today.    Also asked her to ask the PCP to send her thyroid pill over to pharmacy, the [facility] bottle [facility] and it is difficult to get meds moved from there.    Refills needed-  Allopurinol Amio-last fill on 10/31 Atorvastatin- last fill on  10/26 Clopidogrel Jardiance  Fluoxetine- last fill 90 day 8/31 Hydralazine-last fill on 10/26  Nortriptyline  Pantoprazole  Torsemide    Meds verified and 2 wks worth of meds filled in pill boxes.  Will f/u.    Marylouise Stacks, [facility] Paramedic  09/23/21

## 2021-09-23 NOTE — Progress Notes (Signed)
Patient ID: [name], female   DOB: August 18, 1961, 60 y.o.   MRN: [id number]   [name], is a 60 y.o. female  TIW:[id number]  SNK:[id number]  DOB - 28-Mar-1961  Chief Complaint  Patient presents with   Hypertension   Cough       Subjective:   [name] is a 60 y.o. female here today for a 1 month h/o cough.  No fever.  She is still smoking.  Using her inhalers.  Blood sugars have been good.  Appetite is good.  Some discolored mucus.  No hemoptysis  She has not been takin synthroid for a while and is not sure if she is still supposed to be on it.  Last A1C=5.5 03/2021  No problems updated.  ALLERGIES: Allergies  Allergen Reactions   Gabapentin Nausea And Vomiting and Other (See Comments)    POSSIBLE SHAKING   Lyrica [Pregabalin] Other (See Comments)    Shaking     PAST MEDICAL HISTORY: Past Medical History:  Diagnosis Date   Alcohol abuse    Alcoholic cirrhosis ([place])    Anemia    Anxiety    Arthritis    "knees" (11/26/2018)   B12 deficiency    CAD (coronary artery disease)    a. 11/10/2014 Cath: LM nl, LAD min irregs, D1 30 ost, D2 50d, LCX 42m, OM1 80 p/m (1.5 mm vessel), OM2 54m, RCA nondom 51m-->med rx.. Demand ischemia in the setting of rapid a-fib.   Cardiomyopathy ([place])    Carotid artery disease ([place])    a. 01/2015 Carotid Angio: RICA 937, LICA 90W; b. 02/972 s/p L CEA; c. 05/2019 Carotid U/S: RICA 100. RECA >50. LICA 5-32%.   Cellulitis    lower extremities   CHF (congestive heart failure) ([name])    Chronic combined systolic and diastolic CHF (congestive heart failure) ([place])    a. 10/2014 Echo: EF 40-45%; b. 10/2018 Echo: EF 45-50%, gr2 DD; c. 11/2019 Echo: EF 50%, mild LVH, gr2 DD (restrictive), antlat HK, Nl RV fxn. Mild BAE. RVSP 59.42mmHg.   CKD (chronic kidney disease), stage III ([name])    Cocaine abuse ([name])    COPD (chronic obstructive pulmonary disease) ([name])    Critical lower limb ischemia ([name])    Depression    Diabetes mellitus without  complication ([name])    Diabetic peripheral neuropathy ([name])    DVT (deep venous thrombosis) ([name])    Dyspnea    Elevated troponin    a. Chronic elevation.   GERD (gastroesophageal reflux disease)    Hyperlipemia    Hypertension    Hypokalemia    Hypomagnesemia    Hypothyroidism    Marijuana abuse    Narcotic abuse ([place])    Noncompliance    NSVT (nonsustained ventricular tachycardia)    Obesity    PAF (paroxysmal atrial fibrillation) ([name])    Paroxysmal atrial tachycardia ([name])    Peripheral arterial disease ([place])    a. 01/2015 Angio/PTA: RSFA 99 (atherectomy/pta) - 1 vessel runoff via diff dzs peroneal; b. 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   Pneumonia    "once or twice" (11/26/2018)   Poorly controlled type 2 diabetes mellitus ([place])    Renal disorder    Renal insufficiency    a. 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   Sleep apnea    "couldn't handle wearing the mask" (11/26/2018)   Symptomatic bradycardia    a. Avoid AV blocking agent per EP. Prev req temp wire in 2017.  Tobacco abuse     MEDICATIONS AT HOME: Prior to Admission medications   Medication Sig Start Date End Date Taking? Authorizing Provider  azithromycin (ZITHROMAX) 250 MG tablet Take 2 tablets on day 1, then 1 tablet daily on days 2 through 5 09/23/21 09/28/21 Yes Markas Aldredge, Dionne Bucy, PA-C  ACCU-CHEK GUIDE test strip USE TO CHECK BLOOD SUGAR FOUR TIMES DAILY 02/25/20   [provider]  acetaminophen (TYLENOL) 500 MG tablet Take 500 mg by mouth every 6 (six) hours as needed for moderate pain.    [provider]  albuterol (VENTOLIN HFA) 108 (90 Base) MCG/ACT inhaler Inhale 2 puffs into the lungs every 6 (six) hours as needed for wheezing or shortness of breath. 07/30/21   Danford, Suann Larry, MD  allopurinol (ZYLOPRIM) 100 MG tablet Take 1 tablet (100 mg total) by mouth daily. 08/12/21   Charlott Rakes, MD  amiodarone (PACERONE) 200 MG tablet TAKE 1/2 TABLET (100 MG TOTAL) BY MOUTH  DAILY. 09/13/21   Larey Dresser, MD  amLODipine (NORVASC) 10 MG tablet Take 1 tablet (10 mg total) by mouth daily. 10/15/20   Larey Dresser, MD  apixaban (ELIQUIS) 5 MG TABS tablet Take 5 mg by mouth 2 (two) times daily.    [provider]  atorvastatin (LIPITOR) 40 MG tablet Take 40 mg by mouth every evening.    [provider]  benzonatate (TESSALON) 100 MG capsule Take 1 capsule (100 mg total) by mouth 2 (two) times daily as needed for cough. 09/23/21   Argentina Donovan, PA-C  budesonide-formoterol (SYMBICORT) 160-4.5 MCG/ACT inhaler Inhale 2 puffs into the lungs 2 (two) times daily. 12/09/19   Eugenie Filler, MD  clopidogrel (PLAVIX) 75 MG tablet TAKE 1 TABLET (75 MG TOTAL) BY MOUTH DAILY. 09/02/21   Larey Dresser, MD  colchicine 0.6 MG tablet Take 2 tabs (1.2 mg) at the onset of a gout flare, may take 1 tab (0.6 mg) in 1 hour if symptoms 08/11/21   Charlott Rakes, MD  Continuous Blood Gluc Sensor (FREESTYLE LIBRE 2 SENSOR) MISC USE 1 (ONE) EACH EVERY 2 WEEKS Patient not taking: No sig reported 07/14/21   Charlott Rakes, MD  Dulaglutide (TRULICITY) 1.5 YS/0.6TK SOPN Inject 1.5 mg into the skin once a week. 08/11/21   Charlott Rakes, MD  empagliflozin (JARDIANCE) 10 MG TABS tablet Take 1 tablet (10 mg total) by mouth daily before breakfast. 04/01/21   Clegg, [name] D, NP  FLUoxetine (PROZAC) 20 MG capsule Take 1 capsule (20 mg total) by mouth at bedtime. 07/14/21   Charlott Rakes, MD  folic acid (FOLVITE) 1 MG tablet Take 1 tablet (1 mg total) by mouth daily. 07/14/21   Charlott Rakes, MD  hydrALAZINE (APRESOLINE) 50 MG tablet Take 1 tablet (50 mg total) by mouth 3 (three) times daily. 08/11/21   Charlott Rakes, MD  insulin glargine (LANTUS) 100 UNIT/ML Solostar Pen Inject 30 Units into the skin daily. 07/14/21 07/14/22  Charlott Rakes, MD  insulin lispro (HUMALOG) 100 UNIT/ML KwikPen Inject 8 Units into the skin 3 (three) times daily with meals. 01/05/21   Allie Bossier,  MD  Insulin Pen Needle 32G X 4 MM MISC USE AS DIRECTED WITH INSULIN PENS 01/05/21 01/05/22  Allie Bossier, MD  levothyroxine (SYNTHROID, LEVOTHROID) 50 MCG tablet Take 1 tablet (50 mcg total) by mouth daily. 08/09/17   Medina-Vargas, Monina C, NP  losartan (COZAAR) 25 MG tablet Take 2 tablets (50 mg total) by mouth daily. Patient taking differently:  Take 50 mg by mouth at bedtime. 08/16/21 11/14/21  Rafael Bihari, FNP  Misc. Devices MISC 2 L oxygen continuously via nasal cannula.  Diagnosis chronic respiratory failure with hypoxia Patient not taking: No sig reported 08/19/21   Charlott Rakes, MD  nortriptyline (PAMELOR) 10 MG capsule Take 1 capsule (10 mg total) by mouth every other day for 14 days, THEN 1 capsule (10 mg total) 2 (two) times a week for 14 days. Then discontinue. 09/15/21 10/13/21  Charlott Rakes, MD  Omega-3 Fatty Acids (FISH OIL PO) Take 1 capsule by mouth daily.    [provider]  ondansetron (ZOFRAN) 4 MG tablet Take 1 tablet (4 mg total) by mouth every 8 (eight) hours as needed for nausea or vomiting. 08/11/21   Charlott Rakes, MD  pantoprazole (PROTONIX) 40 MG tablet Take 1 tablet (40 mg total) by mouth daily. 02/21/20   Larey Dresser, MD  predniSONE (DELTASONE) 20 MG tablet Take 2 tablets (40 mg total) by mouth daily with breakfast. Patient not taking: No sig reported 08/11/21   Charlott Rakes, MD  sulfamethoxazole-trimethoprim (BACTRIM DS) 800-160 MG tablet Take 1 tablet by mouth 2 (two) times daily.    [provider]  torsemide (DEMADEX) 20 MG tablet Take 60-80 mg by mouth See admin instructions. Take 4 tablets by mouth in the morning, then take 3 tablets by mouth in the afternoon per patient    [provider]  VITAMIN D PO Take 1 tablet by mouth daily.    [provider]  tiotropium (SPIRIVA HANDIHALER) 18 MCG inhalation capsule Place 1 capsule (18 mcg total) into inhaler and inhale every morning. Patient not taking: Reported on  02/09/2021 12/09/19 02/09/21  Eugenie Filler, MD    ROS: Neg HEENT Neg cardiac Neg GI Neg GU Neg MS Neg psych Neg neuro  Objective:   Vitals:   09/23/21 1601  BP: 134/67  Pulse: 73  Resp: 16  SpO2: 95%  Weight: 214 lb 3.2 oz (97.2 kg)   Exam General appearance : Awake, alert, not in any distress. Speech Clear. Not toxic looking HEENT: Atraumatic and Normocephalic, pupils equally reactive to light and accomodation Neck: Supple, no JVD. No cervical lymphadenopathy.  Chest: fair air entry bilaterally, mild wheezing throughout.  No rales or rhonchi CVS: S1 S2 regular, no murmurs.  Extremities: B/L Lower Ext shows no edema, both legs are warm to touch Neurology: Awake alert, and oriented X 3, CN II-XII intact, Non focal Skin: lots of bruising  Data Review Lab Results  Component Value Date   HGBA1C 10.5 (A) 08/11/2021   HGBA1C 9.7 (H) 05/04/2021   HGBA1C 8.2 (H) 01/02/2021    Assessment & Plan   1. DM (diabetes mellitus), type 2 with peripheral vascular complications ([place]) K9T=2.6 03/2021 I have had a lengthy discussion and provided education about insulin resistance and the intake of too much sugar/refined carbohydrates.  I have advised the patient to work at a goal of eliminating sugary drinks, candy, desserts, sweets, refined sugars, processed foods, and white carbohydrates.  The patient expresses understanding.  Continue current regimen - Glucose (CBG)  2. Cough, unspecified type Cover for atypicals  3. Other specified hypothyroidism - Thyroid Panel With TSH   4 Bronchitis cover for atypicals. - azithromycin (ZITHROMAX) 250 MG tablet; Take 2 tablets on day 1, then 1 tablet daily on days 2 through 5  Dispense: 6 tablet; Refill: 0 - benzonatate (TESSALON) 100 MG capsule; Take 1 capsule (100 mg total) by mouth  2 (two) times daily as needed for cough.  Dispense: 40 capsule; Refill: 0  Patient have been counseled extensively about nutrition and exercise. Other  issues discussed during this visit include: low cholesterol diet, weight control and daily exercise, foot care, annual eye examinations at Ophthalmology, importance of adherence with medications and regular follow-up. We also discussed long term complications of uncontrolled diabetes and hypertension.   Return for keep appt Dr Margarita Rana in January.  The patient was given clear instructions to go to ER or return to medical center if symptoms don't improve, worsen or new problems develop. The patient verbalized understanding. The patient was told to call to get lab results if they haven't heard anything in the next week.      Freeman Caldron, PA-C [facility] and [place], [place]   09/23/2021, 4:20 PM

## 2021-09-24 LAB — THYROID PANEL WITH TSH
Free Thyroxine Index: 1.9 (ref 1.2–4.9)
T3 Uptake Ratio: 31 % (ref 24–39)
T4, Total: 6.1 ug/dL (ref 4.5–12.0)
TSH: 3.37 u[IU]/mL (ref 0.450–4.500)

## 2021-09-27 ENCOUNTER — Telehealth: Payer: Self-pay

## 2021-09-27 NOTE — Telephone Encounter (Signed)
[name], I received a message from [name], EMT regarding this patient as a follow up from her visit with you:  She (patient) as been taking her thyroid medication-I just asked [name] if she could get the provider to send in new rx to [place] for them to fill there.    Please advise.  Thanks

## 2021-09-28 ENCOUNTER — Encounter (HOSPITAL_BASED_OUTPATIENT_CLINIC_OR_DEPARTMENT_OTHER): Payer: Medicare Other | Admitting: Internal Medicine

## 2021-09-28 ENCOUNTER — Other Ambulatory Visit: Payer: Self-pay

## 2021-09-28 DIAGNOSIS — F17210 Nicotine dependence, cigarettes, uncomplicated: Secondary | ICD-10-CM | POA: Diagnosis not present

## 2021-09-28 DIAGNOSIS — I872 Venous insufficiency (chronic) (peripheral): Secondary | ICD-10-CM | POA: Diagnosis not present

## 2021-09-28 DIAGNOSIS — E11621 Type 2 diabetes mellitus with foot ulcer: Secondary | ICD-10-CM | POA: Diagnosis not present

## 2021-09-28 DIAGNOSIS — L97518 Non-pressure chronic ulcer of other part of right foot with other specified severity: Secondary | ICD-10-CM | POA: Diagnosis not present

## 2021-09-28 DIAGNOSIS — E1169 Type 2 diabetes mellitus with other specified complication: Secondary | ICD-10-CM | POA: Diagnosis not present

## 2021-09-28 DIAGNOSIS — M86671 Other chronic osteomyelitis, right ankle and foot: Secondary | ICD-10-CM | POA: Diagnosis not present

## 2021-09-28 DIAGNOSIS — E1151 Type 2 diabetes mellitus with diabetic peripheral angiopathy without gangrene: Secondary | ICD-10-CM | POA: Diagnosis not present

## 2021-09-28 DIAGNOSIS — L97512 Non-pressure chronic ulcer of other part of right foot with fat layer exposed: Secondary | ICD-10-CM | POA: Diagnosis not present

## 2021-09-28 DIAGNOSIS — Z89431 Acquired absence of right foot: Secondary | ICD-10-CM | POA: Diagnosis not present

## 2021-09-30 NOTE — Progress Notes (Signed)
CHARLCIE, PRISCO ([id number]) Visit Report for 09/28/2021 Arrival Information Details Patient Name: Date of Service: [name] 09/28/2021 8:45 A M Medical Record Number: [id number] Patient Account Number: [id number] Date of Birth/Sex: Treating RN: 08-10-1961 (60 y.o. [name] Primary Care Keziyah Kneale: Charlott Rakes Other Clinician: Referring Drinda Belgard: Treating Giara Mcgaughey/Extender: Ramon Dredge in Treatment: 31 Visit Information History Since Last Visit Added or deleted any medications: No Patient Arrived: Walker Any new allergies or adverse reactions: No Arrival Time: 08:41 Had a fall or experienced change in No Accompanied By: self activities of daily living that may affect Transfer Assistance: None risk of falls: Patient Identification Verified: Yes Signs or symptoms of abuse/neglect since last visito No Secondary Verification Process Completed: Yes Hospitalized since last visit: No Patient Requires Transmission-Based Precautions: No Implantable device outside of the clinic excluding No Patient Has Alerts: No cellular tissue based products placed in the center since last visit: Has Dressing in Place as Prescribed: Yes Pain Present Now: No Electronic Signature(s) Signed: 09/30/2021 5:23:58 PM By: Levan Hurst RN, BSN Entered By: Levan Hurst on 09/28/2021 08:41:39 -------------------------------------------------------------------------------- Encounter Discharge Information Details Patient Name: Date of Service: [name], [name] 09/28/2021 8:45 A M Medical Record Number: [id number] Patient Account Number: [id number] Date of Birth/Sex: Treating RN: 05-Jun-1961 (60 y.o. [name] Primary Care Emmalie Haigh: Charlott Rakes Other Clinician: Referring Jet Armbrust: Treating Runette Scifres/Extender: Sindy Guadeloupe Weeks in Treatment: 31 Encounter Discharge Information Items Post Procedure Vitals Discharge Condition:  Stable Temperature (F): 98.2 Ambulatory Status: Walker Pulse (bpm): 92 Discharge Destination: Home Respiratory Rate (breaths/min): 16 Transportation: Private Auto Blood Pressure (mmHg): 153/64 Accompanied By: alone Schedule Follow-up Appointment: Yes Clinical Summary of Care: Patient Declined Electronic Signature(s) Signed: 09/30/2021 5:23:58 PM By: Levan Hurst RN, BSN Entered By: Levan Hurst on 09/28/2021 16:38:31 -------------------------------------------------------------------------------- Lower Extremity Assessment Details Patient Name: Date of Service: [name], [name] 09/28/2021 8:45 A M Medical Record Number: [id number] Patient Account Number: [id number] Date of Birth/Sex: Treating RN: 05-12-1961 (60 y.o. [name] Primary Care Miguel Christiana: Charlott Rakes Other Clinician: Referring Jarvin Ogren: Treating Shanae Luo/Extender: Sindy Guadeloupe Weeks in Treatment: 31 Edema Assessment Assessed: Shirlyn Goltz: No] [Right: No] Edema: [Left: Ye] [Right: s] Calf Left: Right: Point of Measurement: From Medial Instep 37 cm Ankle Left: Right: Point of Measurement: From Medial Instep 21 cm Vascular Assessment Pulses: Dorsalis Pedis Palpable: [Right:Yes] Electronic Signature(s) Signed: 09/30/2021 5:23:58 PM By: Levan Hurst RN, BSN Entered By: Levan Hurst on 09/28/2021 08:42:44 -------------------------------------------------------------------------------- Multi Wound Chart Details Patient Name: Date of Service: [name], [name] 09/28/2021 8:45 A M Medical Record Number: [id number] Patient Account Number: [id number] Date of Birth/Sex: Treating RN: 1961-05-24 (60 y.o. Tonita Phoenix, Lauren Primary Care Taran Haynesworth: Charlott Rakes Other Clinician: Referring Keiva Dina: Treating Leisha Trinkle/Extender: Sindy Guadeloupe Weeks in Treatment: 31 Vital Signs Height(in): 64 Capillary Blood Glucose(mg/dl): 141 Weight(lbs): 222 Pulse(bpm):  38 Body Mass Index(BMI): 10 Blood Pressure(mmHg): 153/64 Temperature(F): 98.2 Respiratory Rate(breaths/min): 20 Photos: [16:Right, Plantar Amputation Site -] [N/A:N/A N/A] Wound Location: [16:Transmetatarsal Gradually Appeared] [N/A:N/A] Wounding Event: [16:Diabetic Wound/Ulcer of the Lower] [N/A:N/A] Primary Etiology: [16:Extremity Chronic Obstructive Pulmonary] [N/A:N/A] Comorbid History: [16:Disease (COPD), Sleep Apnea, Arrhythmia, Congestive Heart Failure, Coronary Artery Disease, Hypertension, Peripheral Arterial Disease, Cirrhosis , Type II Diabetes, Osteoarthritis, Neuropathy 02/05/2021] [N/A:N/A] Date Acquired: [16:31] [N/A:N/A] Weeks of Treatment: [16:Open] [N/A:N/A] Wound Status: [16:0.9x0.8x0.5] [N/A:N/A] Measurements L x W x D (cm) [16:0.565] [N/A:N/A] A (cm) : rea [16:0.283] [N/A:N/A] Volume (cm) : [16:65.40%] [N/A:N/A] %  Reduction in A [16:rea: 42.20%] [N/A:N/A] % Reduction in Volume: [16:12] Starting Position 1 (o'clock): [16:12] Ending Position 1 (o'clock): [16:0.4] Maximum Distance 1 (cm): [16:Yes] [N/A:N/A] Undermining: [16:Grade 2] [N/A:N/A] Classification: [16:Medium] [N/A:N/A] Exudate A mount: [16:Serosanguineous] [N/A:N/A] Exudate Type: [16:red, brown] [N/A:N/A] Exudate Color: [16:Thickened] [N/A:N/A] Wound Margin: [16:Large (67-100%)] [N/A:N/A] Granulation A mount: [16:Pink, Pale] [N/A:N/A] Granulation Quality: [16:Small (1-33%)] [N/A:N/A] Necrotic A mount: [16:Fat Layer (Subcutaneous Tissue): Yes N/A] Exposed Structures: [16:Fascia: No Tendon: No Muscle: No Joint: No Bone: No None] [N/A:N/A] Epithelialization: [16:Debridement - Excisional] [N/A:N/A] Debridement: Pre-procedure Verification/Time Out 08:50 [N/A:N/A] Taken: [16:Callus, Subcutaneous] [N/A:N/A] Tissue Debrided: [16:Skin/Subcutaneous Tissue] [N/A:N/A] Level: [16:0.72] [N/A:N/A] Debridement A (sq cm): [16:rea Curette] [N/A:N/A] Instrument: [16:Moderate] [N/A:N/A] Bleeding: [16:Silver  Nitrate] [N/A:N/A] Hemostasis A chieved: [16:0] [N/A:N/A] Procedural Pain: [16:0] [N/A:N/A] Post Procedural Pain: [16:Procedure was tolerated well] [N/A:N/A] Debridement Treatment Response: [16:0.9x0.8x0.5] [N/A:N/A] Post Debridement Measurements L x W x D (cm) [16:0.283] [N/A:N/A] Post Debridement Volume: (cm) [16:Debridement] [N/A:N/A] Treatment Notes Electronic Signature(s) Signed: 09/28/2021 4:37:23 PM By: Linton Ham MD Signed: 09/30/2021 5:03:29 PM By: [name] RN Entered By: Linton Ham on 09/28/2021 09:08:11 -------------------------------------------------------------------------------- Multi-Disciplinary Care Plan Details Patient Name: Date of Service: [name], [name] 09/28/2021 8:45 A M Medical Record Number: [id number] Patient Account Number: [id number] Date of Birth/Sex: Treating RN: 1961-04-30 (60 y.o. [name] Primary Care Becky Berberian: Charlott Rakes Other Clinician: Referring Vin Yonke: Treating Caitlain Tweed/Extender: Ramon Dredge in Treatment: [place] reviewed with physician Active Inactive Wound/Skin Impairment Nursing Diagnoses: Impaired tissue integrity Goals: Patient/caregiver will verbalize understanding of skin care regimen Date Initiated: 02/19/2021 Target Resolution Date: 10/08/2021 Goal Status: Active Ulcer/skin breakdown will have a volume reduction of 30% by week 4 Date Initiated: 02/19/2021 Date Inactivated: 03/29/2021 Target Resolution Date: 04/09/2021 Goal Status: Unmet Unmet Reason: PAD Interventions: Assess patient/caregiver ability to obtain necessary supplies Assess patient/caregiver ability to perform ulcer/skin care regimen upon admission and as needed Assess ulceration(s) every visit Provide education on ulcer and skin care Treatment Activities: Skin care regimen initiated : 02/19/2021 Topical wound management initiated : 02/19/2021 Notes: Electronic  Signature(s) Signed: 09/30/2021 5:23:58 PM By: Levan Hurst RN, BSN Entered By: Levan Hurst on 09/28/2021 08:50:20 -------------------------------------------------------------------------------- Pain Assessment Details Patient Name: Date of Service: [name], [name] 09/28/2021 8:45 A M Medical Record Number: [id number] Patient Account Number: [id number] Date of Birth/Sex: Treating RN: Jul 12, 1961 (60 y.o. [name] Primary Care Allyanna Appleman: Charlott Rakes Other Clinician: Referring Attikus Bartoszek: Treating Himmat Enberg/Extender: Sindy Guadeloupe Weeks in Treatment: 31 Active Problems Location of Pain Severity and Description of Pain Patient Has Paino No Site Locations Pain Management and Medication Current Pain Management: Electronic Signature(s) Signed: 09/30/2021 5:23:58 PM By: Levan Hurst RN, BSN Entered By: Levan Hurst on 09/28/2021 08:42:06 -------------------------------------------------------------------------------- Patient/Caregiver Education Details Patient Name: Date of Service: [name] 11/15/2022andnbsp8:45 A M Medical Record Number: [id number] Patient Account Number: [id number] Date of Birth/Gender: Treating RN: Jun 24, 1961 (60 y.o. [name] Primary Care Physician: Charlott Rakes Other Clinician: Referring Physician: Treating Physician/Extender: Ramon Dredge in Treatment: 31 Education Assessment Education Provided To: Patient Education Topics Provided Wound/Skin Impairment: Methods: Explain/Verbal Responses: State content correctly [employer]) Signed: 09/30/2021 5:23:58 PM By: Levan Hurst RN, BSN Entered By: Levan Hurst on 09/28/2021 08:50:39 -------------------------------------------------------------------------------- Wound Assessment Details Patient Name: Date of Service: [name], [name] 09/28/2021 8:45 A M Medical Record Number: [id number] Patient Account  Number: [id number] Date of Birth/Sex: Treating RN: 1961/05/09 (60 y.o. F) Hollie Salk,  Lauren Primary Care Ziona Wickens: Charlott Rakes Other Clinician: Referring Julyanna Scholle: Treating Keary Waterson/Extender: Sindy Guadeloupe Weeks in Treatment: 31 Wound Status Wound Number: 16 Primary Diabetic Wound/Ulcer of the Lower Extremity Etiology: Wound Location: Right, Plantar Amputation Site - Transmetatarsal Wound Open Wounding Event: Gradually Appeared Status: Date Acquired: 02/05/2021 Comorbid Chronic Obstructive Pulmonary Disease (COPD), Sleep Apnea, Weeks Of Treatment: 31 History: Arrhythmia, Congestive Heart Failure, Coronary Artery Disease, Clustered Wound: No Hypertension, Peripheral Arterial Disease, Cirrhosis , Type II Diabetes, Osteoarthritis, Neuropathy Photos Wound Measurements Length: (cm) 0.9 Width: (cm) 0.8 Depth: (cm) 0.5 Area: (cm) 0.565 Volume: (cm) 0.283 % Reduction in Area: 65.4% % Reduction in Volume: 42.2% Epithelialization: None Tunneling: No Undermining: Yes Starting Position (o'clock): 12 Ending Position (o'clock): 12 Maximum Distance: (cm) 0.4 Wound Description Classification: Grade 2 Wound Margin: Thickened Exudate Amount: Medium Exudate Type: Serosanguineous Exudate Color: red, brown Foul Odor After Cleansing: No Slough/Fibrino Yes Wound Bed Granulation Amount: Large (67-100%) Exposed Structure Granulation Quality: Pink, Pale Fascia Exposed: No Necrotic Amount: Small (1-33%) Fat Layer (Subcutaneous Tissue) Exposed: Yes Necrotic Quality: Adherent Slough Tendon Exposed: No Muscle Exposed: No Joint Exposed: No Bone Exposed: No Treatment Notes 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 Wound Cleanser Discharge Instruction: Cleanse the wound with wound cleanser or  normal saline prior to applying a clean dressing using gauze sponges, not tissue or cotton balls. Peri-Wound Care Sween Lotion (Moisturizing lotion) Discharge Instruction: Apply moisturizing lotion as directed Topical Primary Dressing IODOFLEX 0.9% Cadexomer Iodine Pad 4x6 cm Discharge Instruction: Apply to wound bed as instructed Secondary Dressing Woven Gauze Sponge, Non-Sterile 4x4 in Discharge Instruction: Apply over primary dressing as directed. ABD Pad, 5x9 Discharge Instruction: Apply over primary dressing as directed. Optifoam Non-Adhesive Dressing, 4x4 in Discharge Instruction: Foam donut to help offload Secured With Compression Wrap Kerlix Roll 4.5x3.1 (in/yd) Discharge Instruction: Apply Kerlix and Coban compression as directed. Coban Self-Adherent Wrap 4x5 (in/yd) Discharge Instruction: Apply over Kerlix as directed. Compression Stockings Add-Ons Electronic Signature(s) Signed: 09/30/2021 5:03:29 PM By: [name] RN Signed: 09/30/2021 5:23:58 PM By: Levan Hurst RN, BSN Entered By: Levan Hurst on 09/28/2021 08:43:05 -------------------------------------------------------------------------------- [place] Details Patient Name: Date of Service: [name], [name] 09/28/2021 8:45 A M Medical Record Number: [id number] Patient Account Number: [id number] Date of Birth/Sex: Treating RN: Apr 03, 1961 (60 y.o. [name] Primary Care Wilmina Maxham: Charlott Rakes Other Clinician: Referring Marvion Bastidas: Treating Inaara Tye/Extender: Sindy Guadeloupe Weeks in Treatment: 31 Vital Signs Time Taken: 08:41 Temperature (F): 98.2 Height (in): 64 Pulse (bpm): 92 Weight (lbs): 222 Respiratory Rate (breaths/min): 20 Body Mass Index (BMI): 38.1 Blood Pressure (mmHg): 153/64 Capillary Blood Glucose (mg/dl): 141 Reference Range: 80 - 120 mg / dl Electronic Signature(s) Signed: 09/30/2021 5:23:58 PM By: Levan Hurst RN, BSN Entered By: Levan Hurst on 09/28/2021 08:42:47

## 2021-09-30 NOTE — Progress Notes (Signed)
BIVIANA, SADDLER ([id number]) Visit Report for 09/28/2021 Debridement Details Patient Name: Date of Service: [name] 09/28/2021 8:45 A M Medical Record Number: [id number] Patient Account Number: [id number] Date of Birth/Sex: Treating RN: Sep 21, 1961 (60 y.o. Tonita Phoenix, Lauren Primary Care Provider: Charlott Rakes Other Clinician: Referring Provider: Treating Provider/Extender: Sindy Guadeloupe Weeks in Treatment: 31 Debridement Performed for Assessment: Wound #16 Right,Plantar Amputation Site - Transmetatarsal Performed By: Physician Ricard Dillon., MD Debridement Type: Debridement Severity of Tissue Pre Debridement: Fat layer exposed Level of Consciousness (Pre-procedure): Awake and Alert Pre-procedure Verification/Time Out Yes - 08:50 Taken: Start Time: 08:50 T Area Debrided (L x W): otal 0.9 (cm) x 0.8 (cm) = 0.72 (cm) Tissue and other material debrided: Viable, Non-Viable, Callus, Subcutaneous, Skin: Dermis , Skin: Epidermis Level: Skin/Subcutaneous Tissue Debridement Description: Excisional Instrument: Curette Bleeding: Moderate Hemostasis Achieved: Silver Nitrate End Time: 08:51 Procedural Pain: 0 Post Procedural Pain: 0 Response to Treatment: Procedure was tolerated well Level of Consciousness (Post- Awake and Alert procedure): Post Debridement Measurements of Total Wound Length: (cm) 0.9 Width: (cm) 0.8 Depth: (cm) 0.5 Volume: (cm) 0.283 Character of Wound/Ulcer Post Debridement: Stable Severity of Tissue Post Debridement: Fat layer exposed Post Procedure Diagnosis Same as Pre-procedure Electronic Signature(s) Signed: 09/28/2021 4:37:23 PM By: Linton Ham MD Signed: 09/30/2021 5:03:29 PM By: [name] RN Entered By: Linton Ham on 09/28/2021 09:08:36 -------------------------------------------------------------------------------- HPI Details Patient Name: Date of Service: [name], [name] 09/28/2021 8:45 A  M Medical Record Number: [id number] Patient Account Number: [id number] Date of Birth/Sex: Treating RN: 09-27-1961 (60 y.o. [name] Primary Care Provider: Charlott Rakes Other Clinician: Referring Provider: Treating Provider/Extender: Sindy Guadeloupe Weeks in Treatment: 31 History of Present Illness HPI Description: This 60 year old patient who has a very long significant history of diabetes mellitus, previous alcohol and nicotine abuse, chronic data disease, COPD, diabetes mellitus, height hypertension, critical lower limb ischemia with several wounds being managed at the wound Center at [facility] for over a year. She was recently in the ER at [facility] and was referred to our center. He has had a long history of critical limb ischemia and over a period of time has had balloon angioplasties in March 2016, endarterectomy of the left carotid, lower extremity angiogram and treatment by Dr. Quay Burow, several cardiac catheterizations. Most recently she had an x-ray of her left foot while in the ER which showed no acute abnormality. during this ER visit she was started on ciprofloxacin and asked to continue with the wound care physicians at [facility]. Her last ABI done in June 2017 showed the right side to be 0.28 in the left side to be 0.48. Her right toe brachial index was 0.17 on the right and 0.27 on the left. her last hemoglobin A1c was 11.1% She continues to smoke about a pack of cigarettes a day. 03/28/2017 -- -- right foot x-ray -- IMPRESSION:Areas of soft tissue swelling. Mild subluxation second PIP joint. No frank dislocation or fracture. No erosive change or bony destruction. No soft tissue ulceration or radiopaque foreign body evident by radiography. There is plantar fascia calcification with a nearby inferior calcaneal spur. There are foci of arterial vascular calcification. 04/04/2017 -- the patient continues to be noncompliant and  continues to complain of a lot of pain and has not done anything about quitting smoking Readmission: 06/26/18 on evaluation today patient presents for reevaluation she has not been seen in our office since May 2018. Since  she was last seen here in our office she has undergone testing at Dr. Elspeth Cho office where it was revealed that she had an ABI of 0.68 with her previous ABI being 0.64 and a left ABI of 0.38 with previous ABI being 0.34 this study which was performed on 04/05/18 was pretty much equivalent to the study performed on 11/22/17. The findings in the end revealed on the right would appear to be moderate right lower extremity arterial disease on the left Dr. Gwenlyn Found states moderate in the report but unfortunately this appears to be much more severe at 0.38 compared to the right. Subsequently the patient was scheduled to have angiography with Dr. Gwenlyn Found on 04/12/18. This was however canceled due to the fact that the patient was found to have chronic kidney disease stage III and it was to the point that he did not feel that it will be safe to pursue angiography at that point. She has not been on any antibiotics recently. At this point Dr. Gwenlyn Found has not rescheduled anything as far as the injured [place] according to the patient he is somewhat reluctant to do so. Nonetheless with her diminished blood flow this is gonna make it somewhat difficult for her to heal. 07/05/18; this is a patient I have not seen previously. She has very significant PAD as noted above. She apparently has had revascularization efforts by Dr. Gwenlyn Found on the right on 3 occasions to the patient. She was supposed to have an attempted angiography on the left however this was canceled apparently because of stage III chronic renal failure. I will need to research all of this. She complains of significant pain in the wound and has claudication enough that when she walks to the end of the driveway she has to stop. She is been using  silver alginate to the wounds on her legs and Iodoflex to the area on her left second toe. 07/13/18 on evaluation today patient's wounds actually appear to be doing about the same. She has an appointment she tells me within the next month that is September 2019 with Dr. Gwenlyn Found to discuss options to see if there's anything else that you can recommend or do for her. Nonetheless obviously what we're trying to do is do what we can to save her leg and in turn prevent any additional worsening or damage. None in the meantime we been mainly trying to manage her ulcers as best we can. 07/27/18; some improvement in the multiplicity of wounds on her left lower extremity and foot. She's been using silver alginate. I was unable to determine that she actually has an appointment with Dr. Gwenlyn Found. We are checking into this The patient's wound includes Left lateral foot, left plantar heel, her left anterior calf wound looks close to me, left fourth toe is very close to closed and the left medial calf is perhaps the largest open area READMISSION 02/19/2021 This is a now 60 year old woman with type 2 diabetes and continued cigarette smoking. She has known PAD. She was last in this clinic in 2019 at that point with wounds on her left foot. She left in a nonhealed state. On 06/28/2019 I see she had a left femoropopliteal by vein and vascular with an amputation of the fifth ray. These managed to heal over. Her last arterial studies were in February 2021. This showed an absent waveform at the posterior tibial artery on the right a dampened monophasic dorsalis pedis on the right of 0.44. On the left again the PTA  TA was absent her dorsalis pedis was 0.99 triphasic and her great toe pressure was 0.65. This would have been after her revascularization. Once again she tells me that Dr. Gwenlyn Found has done revascularization on the right leg on 3 different occasions. She has a right transmetatarsal amputation apparently done by Dr. Sharol Given  remotely but I do not see a note for these right lower extremity revascularization but I may not of looked back far enough. In any case she says that the she has a wound on the plantar aspect of the right transmetatarsal amputation site there is been there for several months. More recently she fell and had a wound on her right medial lower leg she had 5 sutures placed in the ER and then subsequently she has developed an area on the medial lower leg which was a blister that opened up. Past medical history includes type 2 diabetes, PAD, chronic renal failure, congestive heart failure, right transmetatarsal amputation, left fifth ray amputation, continued tobacco abuse, atrial fibrillation and cirrhosis. We did not attempt an ABI on the right leg today because of pain 02/26/2021; patient we admitted to the clinic last week. She has what looks to be 2 areas on her right medial and right anterior lower leg that look more like venous wounds but she has 1 on the first met head at the base of her right transmet. She has known severe PAD. She complains of a lot of pain although some of this may be neuropathic. Is difficult to exclude a component of claudication. Use silver alginate on the wounds on her legs and Iodoflex on the area on the first met head. She has an appointment with Dr. Gwenlyn Found on 5/25 although I will text him and discussed the situation. She is have poorly controlled diabetic. She has has stage IIIb chronic renal failure 4/22; patient presents for 1 week follow-up. The 2 areas on her right medial and right anterior lower leg appear well-healing. She has been using silver alginate to this area without issues. She has a first met head ulcer and it is unclear how long this has been there as it was discovered in the ED earlier this month when she was being evaluated for another issue. Iodoflex has been used at this area. 4/29; patient presents for 1 week follow-up. She has been using silver  alginate to the leg wounds and Iodoflex to the plantar foot wound. She has had this wrapped with Coban and Kerlix. She has no concerns or complaints today. 5/6; this is a difficult wound on the right plantar foot transmit site. We are not making a lot of progress. She had 2 more venous looking wounds on the right medial and right anterior lower leg 1 of which is healed. Her appointment with Dr. Gwenlyn Found is on 5/25 5/16; she did not tolerate the offloading shoe we gave her in fact she had a fall with an abrasion on her right forearm she is now back in the modified small shoe which does not offload her foot properly. Her appoint with Dr. Gwenlyn Found is still on 5/25 we have been using Iodoflex. The area on the right anterior lower leg is healed 7/18; patient presents for follow-up however has not been seen in 2 months. She was last seen at the end of May. She had a fall at the end of June and was hospitalized. She has been unable to follow-up since then. For the wound she has only been keeping it covered with gauze. She is  scheduled for an aortogram with lower extremity runoff at the end of July. 7/29; patient presents for 2-week follow-up. She has home health and they have been changing her dressings. She denies any issues and has no complaints today. She states she had a procedure where they opened up one of her vessels in her legs. She denies signs of infection. 8/5; patient presents for follow-up. She was recently in the hospital for bradycardia. She was noted to have cellulitis to the left lower extremity and Unna boot was placed due to blisters and increased swelling. She reports improvement to her left leg since being in the hospital and stability check her right plantar foot wound. She denies signs of infection. 8/23; since I last saw this patient a lot has happened. She still has the area over the right first met head in the setting of previous transmetatarsal amputation. Firstly most importantly she  underwent revascularization of her occluded right SFA. She underwent directional atherectomy followed by drug-coated balloon angioplasty. She has no named vessel below the knee hopefully the revascularization of the right SFA will improve collateral flow. It is not felt however that she has any endovascular options. She had follow-up arterial studies noninvasive on 8/9. These showed an ABI of 0.44 at the right PTA. Monophasic waveforms. On the left her great toe ABI was 0.68. Her follow-up arterial Doppler showed a 50 to 74% stenosis in the proximal SFA and a 50 to 74% stenosis in the distal SFA mild to severe atherosclerosis noted throughout the extremity. Areas of shadowing plaque seen, unable to rule out higher grade stenosis she also had a fall apparently wearing a right foot forefoot off loader that we gave her. She therefore comes in in an ordinary running shoe today. Not certain if this is the fall that ended up in the hospital with bradycardia 9/6 area over the right first metatarsal head in the setting of her previous amputation and severe PAD. She is also a diabetic with known PAD status post attempt at revascularization. She is continued smoker Again the separation of visits in the clinic is somewhat disturbing if we are going to consider her for a total contact cast. She is not wearing anything to offload this stating it causes imbalance especially the forefoot off loader. She basically comes in in bedroom slippers She also had a fall this morning about an hour ago. She has a skin tear on the left dorsal forearm 9/13; areas over the right first metatarsal head in the setting of previous TMA and severe PAD. Again she comes in here in slippers. We have been using Hydrofera Blue. We use MolecuLight on this which was essentially negative study 9/20; right first metatarsal head in the setting of her previous TMA and severe PAD. Same slipper type shoes. She says she cannot wear a forefoot off  loader and for some reason she will not wear surgical shoes. She says she is smoking half a pack per day. 9/30; right first metatarsal head. Absolutely no improvement in fact there is tunneling superiorly very close to bone. She does not offload this properly at all. We use MolecuLight on this 2 weeks ago that did not show any surface bacteria we have been using silver collagen is a dressing She tells me she is down to 3 cigarettes a day I have offered encouragement and a treatment plan 10/6; right first metatarsal head. Exposed bone this week which is not surprising. We have been using silver collagen. She is smoking 5  to 10 cigarettes a day 10/17; she has not been here in 10 days. The bone scraping that I did on 10/6 showed staph lugdunensis, staph epidermidis and Pseudomonas Alcalifenes. I put in for Septra DS 1 p.o. twice daily for 14 days last week would be but we could not reach her to start the antibiotic. Quinolones would have been a good alternative except she has multiple drug interactions. Septra would not cover what ever the Pseudomonas is but I am not really sure of the relevance here. I am going to try her on the Septra for 2 weeks. She has a probing wound on the right TMA that probes to bone. She claims to have just about stop smoking I reviewed her arterial status. She underwent a left fem-tib bypass by Dr. Donnetta Hutching in 06/28/2019. Her last angiogram was in July of this year by Dr. Gwenlyn Found. This showed an occluded right SFA the popliteal and tibial vessels were also occluded the peroneal vessel filled by collaterals and filled the PT and DP by communicating collaterals. She was not felt to have any additional and vascular options. Dr. Gwenlyn Found noted that she he had previously revascularized her right SFA 3 times. 10/25; she is tolerating the Septra but says it makes her nauseated. I am going to continue this for an additional 2 weeks perhaps for a total of 6 weeks. Her wound does not look too  much different. There is on the right first met metatarsal head. There is probing areas around the tissue that is in the middle of the wound. There is no purulence I cannot feel bone. The original source was for a bone scraping. 11/8; right first metatarsal head. This does not probe to bone and there is no purulence. As far as I can tell she is completed the Septra although there were problems with exact length of time and I think compliance. In any case I gave her 6 weeks. I have reviewed her PAD above. 11/15; right first metatarsal head. Again protruding subcutaneous tissue but this does not have any adherence to surrounding skin. Undermining circumferentially. There is no palpable bone. Not grossly purulent. We have been using Iodoflex to try to get some adherence to clean off the surface but no real improvement. I do not think they actually had enough supplies listening to the patient. She completed the 6 weeks of Septra I gave her, but I am not sure about the compliance with the dates i.e. may have missed days taken 1 a day etc. The patient has a vascular follow-up with Dr. Gwenlyn Found this coming Friday i.e. in 3 days. By my read of venous evaluations I do not think she is felt to be a candidate for any further revascularization. The last angiogram was in July. 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 She continues to smoke now up to 10 cigarettes a day. She is not in any pain. Wound has not improved but is certainly not worsened. I gave her 6 weeks of trimethoprim/sulfamethoxazole which she is completed in some fashion. Electronic Signature(s) Signed: 09/28/2021 4:37:23 PM By: Linton Ham MD Entered By: Linton Ham on 09/28/2021 09:15:23 -------------------------------------------------------------------------------- Physical Exam Details Patient Name: Date of Service: [name], [name] 09/28/2021 8:45 A M Medical Record Number: [id number] Patient Account Number:  [id number] Date of Birth/Sex: Treating RN: 01/31/1961 (60 y.o. [name] Primary Care Provider: Charlott Rakes Other Clinician: Referring Provider: Treating Provider/Extender: Sindy Guadeloupe Weeks in Treatment: 31 Constitutional Patient is  hypertensive.. Pulse regular and within target range for patient.[name] Respirations regular, non-labored and within target range.. Temperature is normal and within the target range for the patient.[name] Appears in no distress. Cardiovascular Pedal pulses are not palpable on the right although her foot is still warm. Previous TMA. Notes Wound exam; right first metatarsal head. No major change. She has protruding granulation however it is covered in a nonviable surface and also nonadherent with the surrounding tissue i.e. not a viable surface for epithelialization. I cannot see any evidence of infection. Probing around this tissue does not reveal exposed bone. I used a #5 curette to remove a lot of the surface of this also some of the surrounding skin and soft tissue. Hemostasis with silver nitrate Electronic Signature(s) Signed: 09/28/2021 4:37:23 PM By: Linton Ham MD Entered By: Linton Ham on 09/28/2021 09:15:59 -------------------------------------------------------------------------------- Physician Orders Details Patient Name: Date of Service: [name], [name] 09/28/2021 8:45 A M Medical Record Number: [id number] Patient Account Number: [id number] Date of Birth/Sex: Treating RN: 10-01-1961 (60 y.o. [name] Primary Care Provider: Charlott Rakes Other Clinician: Referring Provider: Treating Provider/Extender: Sindy Guadeloupe Weeks in Treatment: 64 Verbal / Phone Orders: No Diagnosis Coding ICD-10 Coding Code Description L97.518 Non-pressure chronic ulcer of other part of right foot with other specified severity 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 2 diabetes mellitus with foot ulcer M86.671 Other chronic  osteomyelitis, right ankle and foot E11.51 Type 2 diabetes mellitus with diabetic peripheral angiopathy without gangrene I87.2 Venous insufficiency (chronic) (peripheral) Follow-up Appointments ppointment in 2 weeks. - Dr. Dellia Nims Return A Nurse Visit: - 1 week for rewrap Bathing/ Shower/ Hygiene May shower with protection but do not get wound dressing(s) wet. Edema Control - Lymphedema / SCD / Other Elevate legs to the level of the heart or above for 30 minutes daily and/or when sitting, a frequency of: - throughout the day Avoid standing for long periods of time. Patient to wear own compression stockings every day. - left leg Exercise regularly Off-Loading Open toe surgical shoe to: - with felt padding to offload Additional Orders / Instructions Stop/Decrease Smoking Follow Nutritious Diet Wound Treatment Wound #16 - Amputation Site - Transmetatarsal Wound Laterality: Plantar, Right Cleanser: Soap and Water 1 x Per Week/7 Days Discharge Instructions: May shower and wash wound with dial antibacterial soap and water prior to dressing change. Cleanser: Wound Cleanser 1 x Per Week/7 Days Discharge Instructions: Cleanse the wound with wound cleanser or normal saline prior to applying a clean dressing using gauze sponges, not tissue or cotton balls. Peri-Wound Care: Sween Lotion (Moisturizing lotion) (Home Health) 1 x Per Week/7 Days Discharge Instructions: Apply moisturizing lotion as directed Prim Dressing: IODOFLEX 0.9% Cadexomer Iodine Pad 4x6 cm 1 x Per Week/7 Days ary Discharge Instructions: Apply to wound bed as instructed Secondary Dressing: Woven Gauze Sponge, Non-Sterile 4x4 in 1 x Per Week/7 Days Discharge Instructions: Apply over primary dressing as directed. Secondary Dressing: ABD Pad, 5x9 1 x Per Week/7 Days Discharge Instructions: Apply over primary dressing as directed. Secondary Dressing: Optifoam Non-Adhesive Dressing, 4x4 in 1 x Per Week/7 Days Discharge  Instructions: Foam donut to help offload Compression Wrap: Kerlix Roll 4.5x3.1 (in/yd) 1 x Per Week/7 Days Discharge Instructions: Apply Kerlix and Coban compression as directed. Compression Wrap: Coban Self-Adherent Wrap 4x5 (in/yd) 1 x Per Week/7 Days Discharge Instructions: Apply over Kerlix as directed. Electronic Signature(s) Signed: 09/28/2021 4:37:23 PM By: Linton Ham MD Signed: 09/30/2021 5:23:58 PM By: Levan Hurst RN, BSN Entered By: Levan Hurst on  09/28/2021 08:53:59 -------------------------------------------------------------------------------- Problem List Details Patient Name: Date of Service: [name] 09/28/2021 8:45 A M Medical Record Number: [id number] Patient Account Number: [id number] Date of Birth/Sex: Treating RN: 1961/11/02 (60 y.o. [name] Primary Care Provider: Charlott Rakes Other Clinician: Referring Provider: Treating Provider/Extender: Sindy Guadeloupe Weeks in Treatment: 31 Active Problems ICD-10 Encounter Code Description Active Date MDM Diagnosis L97.518 Non-pressure chronic ulcer of other part of right foot with other specified 09/07/2021 No Yes severity 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 2 diabetes mellitus with foot ulcer 02/19/2021 No Yes M86.671 Other chronic osteomyelitis, right ankle and foot 08/30/2021 No Yes E11.51 Type 2 diabetes mellitus with diabetic peripheral angiopathy without gangrene 02/19/2021 No Yes I87.2 Venous insufficiency (chronic) (peripheral) 06/18/2021 No Yes Inactive Problems ICD-10 Code Description Active Date Inactive Date L97.811 Non-pressure chronic ulcer of other part of right lower leg limited to breakdown of skin 02/19/2021 02/19/2021 S81.802A Unspecified open wound, left lower leg, initial encounter 06/18/2021 06/18/2021 S40.812D Abrasion of left upper arm, subsequent encounter 07/20/2021 07/20/2021 Resolved Problems ICD-10 Code Description Active Date Resolved Date S40.811D Abrasion of right upper arm,  subsequent encounter 03/29/2021 03/29/2021 Electronic Signature(s) Signed: 09/28/2021 4:37:23 PM By: Linton Ham MD Entered By: Linton Ham on 09/28/2021 09:07:58 -------------------------------------------------------------------------------- Progress Note Details Patient Name: Date of Service: [name], [name] 09/28/2021 8:45 A M Medical Record Number: [id number] Patient Account Number: [id number] Date of Birth/Sex: Treating RN: 1961-07-06 (60 y.o. Tonita Phoenix, Lauren Primary Care Provider: Charlott Rakes Other Clinician: Referring Provider: Treating Provider/Extender: Sindy Guadeloupe Weeks in Treatment: 31 Subjective History of Present Illness (HPI) This 60 year old patient who has a very long significant history of diabetes mellitus, previous alcohol and nicotine abuse, chronic data disease, COPD, diabetes mellitus, height hypertension, critical lower limb ischemia with several wounds being managed at the wound Center at [facility] for over a year. She was recently in the ER at [facility] and was referred to our center. He has had a long history of critical limb ischemia and over a period of time has had balloon angioplasties in March 2016, endarterectomy of the left carotid, lower extremity angiogram and treatment by Dr. Quay Burow, several cardiac catheterizations. Most recently she had an x-ray of her left foot while in the ER which showed no acute abnormality. during this ER visit she was started on ciprofloxacin and asked to continue with the wound care physicians at [facility]. Her last ABI done in June 2017 showed the right side to be 0.28 in the left side to be 0.48. Her right toe brachial index was 0.17 on the right and 0.27 on the left. her last hemoglobin A1c was 11.1% She continues to smoke about a pack of cigarettes a day. 03/28/2017 -- -- right foot x-ray -- IMPRESSION:Areas of soft tissue swelling. Mild subluxation second PIP joint.  No frank dislocation or fracture. No erosive change or bony destruction. No soft tissue ulceration or radiopaque foreign body evident by radiography. There is plantar fascia calcification with a nearby inferior calcaneal spur. There are foci of arterial vascular calcification. 04/04/2017 -- the patient continues to be noncompliant and continues to complain of a lot of pain and has not done anything about quitting smoking Readmission: 06/26/18 on evaluation today patient presents for reevaluation she has not been seen in our office since May 2018. Since she was last seen here in our office she has undergone testing at Dr. Fransico Loveda Colaizzi office where it was revealed that she had an  ABI of 0.68 with her previous ABI being 0.64 and a left ABI of 0.38 with previous ABI being 0.34 this study which was performed on 04/05/18 was pretty much equivalent to the study performed on 11/22/17. The findings in the end revealed on the right would appear to be moderate right lower extremity arterial disease on the left Dr. Gwenlyn Found states moderate in the report but unfortunately this appears to be much more severe at 0.38 compared to the right. Subsequently the patient was scheduled to have angiography with Dr. Gwenlyn Found on 04/12/18. This was however canceled due to the fact that the patient was found to have chronic kidney disease stage III and it was to the point that he did not feel that it will be safe to pursue angiography at that point. She has not been on any antibiotics recently. At this point Dr. Gwenlyn Found has not rescheduled anything as far as the injured [place] according to the patient he is somewhat reluctant to do so. Nonetheless with her diminished blood flow this is gonna make it somewhat difficult for her to heal. 07/05/18; this is a patient I have not seen previously. She has very significant PAD as noted above. She apparently has had revascularization efforts by Dr. Gwenlyn Found on the right on 3 occasions to the  patient. She was supposed to have an attempted angiography on the left however this was canceled apparently because of stage III chronic renal failure. I will need to research all of this. She complains of significant pain in the wound and has claudication enough that when she walks to the end of the driveway she has to stop. She is been using silver alginate to the wounds on her legs and Iodoflex to the area on her left second toe. 07/13/18 on evaluation today patient's wounds actually appear to be doing about the same. She has an appointment she tells me within the next month that is September 2019 with Dr. Gwenlyn Found to discuss options to see if there's anything else that you can recommend or do for her. Nonetheless obviously what we're trying to do is do what we can to save her leg and in turn prevent any additional worsening or damage. None in the meantime we been mainly trying to manage her ulcers as best we can. 07/27/18; some improvement in the multiplicity of wounds on her left lower extremity and foot. She's been using silver alginate. I was unable to determine that she actually has an appointment with Dr. Gwenlyn Found. We are checking into this The patient's wound includes Left lateral foot, left plantar heel, her left anterior calf wound looks close to me, left fourth toe is very close to closed and the left medial calf is perhaps the largest open area READMISSION 02/19/2021 This is a now 60 year old woman with type 2 diabetes and continued cigarette smoking. She has known PAD. She was last in this clinic in 2019 at that point with wounds on her left foot. She left in a nonhealed state. On 06/28/2019 I see she had a left femoropopliteal by vein and vascular with an amputation of the fifth ray. These managed to heal over. Her last arterial studies were in February 2021. This showed an absent waveform at the posterior tibial artery on the right a dampened monophasic dorsalis pedis on the right of 0.44. On  the left again the PTA TA was absent her dorsalis pedis was 0.99 triphasic and her great toe pressure was 0.65. This would have been after her revascularization. Once  again she tells me that Dr. Gwenlyn Found has done revascularization on the right leg on 3 different occasions. She has a right transmetatarsal amputation apparently done by Dr. Sharol Given remotely but I do not see a note for these right lower extremity revascularization but I may not of looked back far enough. In any case she says that the she has a wound on the plantar aspect of the right transmetatarsal amputation site there is been there for several months. More recently she fell and had a wound on her right medial lower leg she had 5 sutures placed in the ER and then subsequently she has developed an area on the medial lower leg which was a blister that opened up. Past medical history includes type 2 diabetes, PAD, chronic renal failure, congestive heart failure, right transmetatarsal amputation, left fifth ray amputation, continued tobacco abuse, atrial fibrillation and cirrhosis. We did not attempt an ABI on the right leg today because of pain 02/26/2021; patient we admitted to the clinic last week. She has what looks to be 2 areas on her right medial and right anterior lower leg that look more like venous wounds but she has 1 on the first met head at the base of her right transmet. She has known severe PAD. She complains of a lot of pain although some of this may be neuropathic. Is difficult to exclude a component of claudication. Use silver alginate on the wounds on her legs and Iodoflex on the area on the first met head. She has an appointment with Dr. Gwenlyn Found on 5/25 although I will text him and discussed the situation. She is have poorly controlled diabetic. She has has stage IIIb chronic renal failure 4/22; patient presents for 1 week follow-up. The 2 areas on her right medial and right anterior lower leg appear well-healing. She has been  using silver alginate to this area without issues. She has a first met head ulcer and it is unclear how long this has been there as it was discovered in the ED earlier this month when she was being evaluated for another issue. Iodoflex has been used at this area. 4/29; patient presents for 1 week follow-up. She has been using silver alginate to the leg wounds and Iodoflex to the plantar foot wound. She has had this wrapped with Coban and Kerlix. She has no concerns or complaints today. 5/6; this is a difficult wound on the right plantar foot transmit site. We are not making a lot of progress. She had 2 more venous looking wounds on the right medial and right anterior lower leg 1 of which is healed. Her appointment with Dr. Gwenlyn Found is on 5/25 5/16; she did not tolerate the offloading shoe we gave her in fact she had a fall with an abrasion on her right forearm she is now back in the modified small shoe which does not offload her foot properly. Her appoint with Dr. Gwenlyn Found is still on 5/25 we have been using Iodoflex. The area on the right anterior lower leg is healed 7/18; patient presents for follow-up however has not been seen in 2 months. She was last seen at the end of May. She had a fall at the end of June and was hospitalized. She has been unable to follow-up since then. For the wound she has only been keeping it covered with gauze. She is scheduled for an aortogram with lower extremity runoff at the end of July. 7/29; patient presents for 2-week follow-up. She has home health and they  have been changing her dressings. She denies any issues and has no complaints today. She states she had a procedure where they opened up one of her vessels in her legs. She denies signs of infection. 8/5; patient presents for follow-up. She was recently in the hospital for bradycardia. She was noted to have cellulitis to the left lower extremity and Unna boot was placed due to blisters and increased swelling. She  reports improvement to her left leg since being in the hospital and stability check her right plantar foot wound. She denies signs of infection. 8/23; since I last saw this patient a lot has happened. She still has the area over the right first met head in the setting of previous transmetatarsal amputation. Firstly most importantly she underwent revascularization of her occluded right SFA. She underwent directional atherectomy followed by drug-coated balloon angioplasty. She has no named vessel below the knee hopefully the revascularization of the right SFA will improve collateral flow. It is not felt however that she has any endovascular options. She had follow-up arterial studies noninvasive on 8/9. These showed an ABI of 0.44 at the right PTA. Monophasic waveforms. On the left her great toe ABI was 0.68. Her follow-up arterial Doppler showed a 50 to 74% stenosis in the proximal SFA and a 50 to 74% stenosis in the distal SFA mild to severe atherosclerosis noted throughout the extremity. Areas of shadowing plaque seen, unable to rule out higher grade stenosis she also had a fall apparently wearing a right foot forefoot off loader that we gave her. She therefore comes in in an ordinary running shoe today. Not certain if this is the fall that ended up in the hospital with bradycardia 9/6 area over the right first metatarsal head in the setting of her previous amputation and severe PAD. She is also a diabetic with known PAD status post attempt at revascularization. She is continued smoker Again the separation of visits in the clinic is somewhat disturbing if we are going to consider her for a total contact cast. She is not wearing anything to offload this stating it causes imbalance especially the forefoot off loader. She basically comes in in bedroom slippers She also had a fall this morning about an hour ago. She has a skin tear on the left dorsal forearm 9/13; areas over the right first metatarsal  head in the setting of previous TMA and severe PAD. Again she comes in here in slippers. We have been using Hydrofera Blue. We use MolecuLight on this which was essentially negative study 9/20; right first metatarsal head in the setting of her previous TMA and severe PAD. Same slipper type shoes. She says she cannot wear a forefoot off loader and for some reason she will not wear surgical shoes. She says she is smoking half a pack per day. 9/30; right first metatarsal head. Absolutely no improvement in fact there is tunneling superiorly very close to bone. She does not offload this properly at all. We use MolecuLight on this 2 weeks ago that did not show any surface bacteria we have been using silver collagen is a dressing She tells me she is down to 3 cigarettes a day I have offered encouragement and a treatment plan 10/6; right first metatarsal head. Exposed bone this week which is not surprising. We have been using silver collagen. She is smoking 5 to 10 cigarettes a day 10/17; she has not been here in 10 days. The bone scraping that I did on 10/6 showed staph lugdunensis,  staph epidermidis and Pseudomonas Alcalifenes. I put in for Septra DS 1 p.o. twice daily for 14 days last week would be but we could not reach her to start the antibiotic. Quinolones would have been a good alternative except she has multiple drug interactions. Septra would not cover what ever the Pseudomonas is but I am not really sure of the relevance here. I am going to try her on the Septra for 2 weeks. She has a probing wound on the right TMA that probes to bone. She claims to have just about stop smoking I reviewed her arterial status. She underwent a left fem-tib bypass by Dr. Donnetta Hutching in 06/28/2019. Her last angiogram was in July of this year by Dr. Gwenlyn Found. This showed an occluded right SFA the popliteal and tibial vessels were also occluded the peroneal vessel filled by collaterals and filled the PT and DP by communicating  collaterals. She was not felt to have any additional and vascular options. Dr. Gwenlyn Found noted that she he had previously revascularized her right SFA 3 times. 10/25; she is tolerating the Septra but says it makes her nauseated. I am going to continue this for an additional 2 weeks perhaps for a total of 6 weeks. Her wound does not look too much different. There is on the right first met metatarsal head. There is probing areas around the tissue that is in the middle of the wound. There is no purulence I cannot feel bone. The original source was for a bone scraping. 11/8; right first metatarsal head. This does not probe to bone and there is no purulence. As far as I can tell she is completed the Septra although there were problems with exact length of time and I think compliance. In any case I gave her 6 weeks. I have reviewed her PAD above. 11/15; right first metatarsal head. Again protruding subcutaneous tissue but this does not have any adherence to surrounding skin. Undermining circumferentially. There is no palpable bone. Not grossly purulent. We have been using Iodoflex to try to get some adherence to clean off the surface but no real improvement. I do not think they actually had enough supplies listening to the patient. She completed the 6 weeks of Septra I gave her, but I am not sure about the compliance with the dates i.e. may have missed days taken 1 a day etc. The patient has a vascular follow-up with Dr. Gwenlyn Found this coming Friday i.e. in 3 days. By my read of venous evaluations I do not think she is felt to be a candidate for any further revascularization. The last angiogram was in July. 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 She continues to smoke now up to 10 cigarettes a day. She is not in any pain. Wound has not improved but is certainly not worsened. I gave her 6 weeks of trimethoprim/sulfamethoxazole which she is completed in some  fashion. Objective Constitutional Patient is hypertensive.. Pulse regular and within target range for patient.[name] Respirations regular, non-labored and within target range.. Temperature is normal and within the target range for the patient.[name] Appears in no distress. Vitals Time Taken: 8:41 AM, Height: 64 in, Weight: 222 lbs, BMI: 38.1, Temperature: 98.2 F, Pulse: 92 bpm, Respiratory Rate: 20 breaths/min, Blood Pressure: 153/64 mmHg, Capillary Blood Glucose: 141 mg/dl. Cardiovascular Pedal pulses are not palpable on the right although her foot is still warm. Previous TMA. General Notes: Wound exam; right first metatarsal head. No major change. She has protruding granulation however it  is covered in a nonviable surface and also nonadherent with the surrounding tissue i.e. not a viable surface for epithelialization. I cannot see any evidence of infection. Probing around this tissue does not reveal exposed bone. I used a #5 curette to remove a lot of the surface of this also some of the surrounding skin and soft tissue. Hemostasis with silver nitrate Integumentary (Hair, Skin) Wound #16 status is Open. Original cause of wound was Gradually Appeared. The date acquired was: 02/05/2021. The wound has been in treatment 31 weeks. The wound is located on the Right,Plantar Amputation Site - Transmetatarsal. The wound measures 0.9cm length x 0.8cm width x 0.5cm depth; 0.565cm^2 area and 0.283cm^3 volume. There is Fat Layer (Subcutaneous Tissue) exposed. There is no tunneling noted, however, there is undermining starting at 12:00 and ending at 12:00 with a maximum distance of 0.4cm. There is a medium amount of serosanguineous drainage noted. The wound margin is thickened. There is large (67-100%) pink, pale granulation within the wound bed. There is a small (1-33%) amount of necrotic tissue within the wound bed including Adherent Slough. Assessment Active Problems ICD-10 Non-pressure chronic ulcer of other  part of right foot with other specified severity Type 2 diabetes mellitus with foot ulcer Other chronic osteomyelitis, right ankle and foot Type 2 diabetes mellitus with diabetic peripheral angiopathy without gangrene Venous insufficiency (chronic) (peripheral) Procedures Wound #16 Pre-procedure diagnosis of Wound #16 is a Diabetic Wound/Ulcer of the Lower Extremity located on the Right,Plantar Amputation Site - Transmetatarsal .Severity of Tissue Pre Debridement is: Fat layer exposed. There was a Excisional Skin/Subcutaneous Tissue Debridement with a total area of 0.72 sq cm performed by Ricard Dillon., MD. With the following instrument(s): Curette to remove Viable and Non-Viable tissue/material. Material removed includes Callus, Subcutaneous Tissue, Skin: Dermis, and Skin: Epidermis. No specimens were taken. A time out was conducted at 08:50, prior to the start of the procedure. A Moderate amount of bleeding was controlled with Silver Nitrate. The procedure was tolerated well with a pain level of 0 throughout and a pain level of 0 following the procedure. Post Debridement Measurements: 0.9cm length x 0.8cm width x 0.5cm depth; 0.283cm^3 volume. Character of Wound/Ulcer Post Debridement is stable. Severity of Tissue Post Debridement is: Fat layer exposed. Post procedure Diagnosis Wound #16: Same as Pre-Procedure Plan Follow-up Appointments: Return Appointment in 2 weeks. - Dr. Dellia Nims Nurse Visit: - 1 week for rewrap Bathing/ Shower/ Hygiene: May shower with protection but do not get wound dressing(s) wet. Edema Control - Lymphedema / SCD / Other: Elevate legs to the level of the heart or above for 30 minutes daily and/or when sitting, a frequency of: - throughout the day Avoid standing for long periods of time. Patient to wear own compression stockings every day. - left leg Exercise regularly Off-Loading: Open toe surgical shoe to: - with felt padding to offload Additional Orders /  Instructions: Stop/Decrease Smoking Follow Nutritious Diet WOUND #16: - Amputation Site - Transmetatarsal Wound Laterality: Plantar, Right Cleanser: Soap and Water 1 x Per Week/7 Days Discharge Instructions: May shower and wash wound with dial antibacterial soap and water prior to dressing change. Cleanser: Wound Cleanser 1 x Per Week/7 Days Discharge Instructions: Cleanse the wound with wound cleanser or normal saline prior to applying a clean dressing using gauze sponges, not tissue or cotton balls. Peri-Wound Care: Sween Lotion (Moisturizing lotion) (Home Health) 1 x Per Week/7 Days Discharge Instructions: Apply moisturizing lotion as directed Prim Dressing: IODOFLEX 0.9% Cadexomer Iodine Pad 4x6  cm 1 x Per Week/7 Days ary Discharge Instructions: Apply to wound bed as instructed Secondary Dressing: Woven Gauze Sponge, Non-Sterile 4x4 in 1 x Per Week/7 Days Discharge Instructions: Apply over primary dressing as directed. Secondary Dressing: ABD Pad, 5x9 1 x Per Week/7 Days Discharge Instructions: Apply over primary dressing as directed. Secondary Dressing: Optifoam Non-Adhesive Dressing, 4x4 in 1 x Per Week/7 Days Discharge Instructions: Foam donut to help offload Com pression Wrap: Kerlix Roll 4.5x3.1 (in/yd) 1 x Per Week/7 Days Discharge Instructions: Apply Kerlix and Coban compression as directed. Com pression Wrap: Coban Self-Adherent Wrap 4x5 (in/yd) 1 x Per Week/7 Days Discharge Instructions: Apply over Kerlix as directed. #1 I have continued with the Iodoflex after debridement. The exact significance of this leg and kerlix Coban and bring him in weekly 2. She sees Dr. Gwenlyn Found on Friday I will send him a quick text although my read of previous notes does not suggest options for further revascularization. Fortunately things have not deteriorated although certainly not improved in any major way 3. I would not be totally opposed to attempting her in a total contact cast but only if we  can assure that she has compliance in the clinic Electronic Signature(s) Signed: 09/28/2021 4:37:23 PM By: Linton Ham MD Entered By: Linton Ham on 09/28/2021 09:17:59 -------------------------------------------------------------------------------- SuperBill Details Patient Name: Date of Service: [name], [name] 09/28/2021 Medical Record Number: [id number] Patient Account Number: [id number] Date of Birth/Sex: Treating RN: 1961/05/24 (60 y.o. Tonita Phoenix, Lauren Primary Care Provider: Charlott Rakes Other Clinician: Referring Provider: Treating Provider/Extender: Sindy Guadeloupe Weeks in Treatment: 31 Diagnosis Coding ICD-10 Codes Code Description [phone] Non-pressure chronic ulcer of other part of right foot with other specified severity 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 2 diabetes mellitus with foot ulcer M86.671 Other chronic osteomyelitis, right ankle and foot E11.51 Type 2 diabetes mellitus with diabetic peripheral angiopathy without gangrene I87.2 Venous insufficiency (chronic) (peripheral) Facility Procedures CPT4 Code: [id number] Description: 71959 - DEB SUBQ TISSUE 20 SQ CM/< ICD-10 Diagnosis Description L97.518 Non-pressure chronic ulcer of other part of right foot with other specified sev 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 2 diabetes mellitus with foot ulcer Modifier: erity Quantity: 1 Physician Procedures : CPT4 Code Description Modifier [id number] - WC PHYS SUBQ TISS 20 SQ CM 1 ICD-10 Diagnosis Description L97.518 Non-pressure chronic ulcer of other part of right foot with other specified severity 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 2 diabetes mellitus with foot ulcer Quantity: Electronic Signature(s) Signed: 09/28/2021 4:37:23 PM By: Linton Ham MD Entered By: Linton Ham on 09/28/2021 09:18:17

## 2021-10-01 ENCOUNTER — Ambulatory Visit: Payer: Medicare (Managed Care) | Admitting: Cardiovascular Disease

## 2021-10-01 IMAGING — DX DG FOOT COMPLETE 3+V*R*
3 series · 3 of 3 positions shown · non-contrast
Comparison: Preoperative radiograph 06/13/2017

CLINICAL DATA: Right foot pain.

EXAM:
RIGHT FOOT COMPLETE - 3+ VIEW

[foot ap]
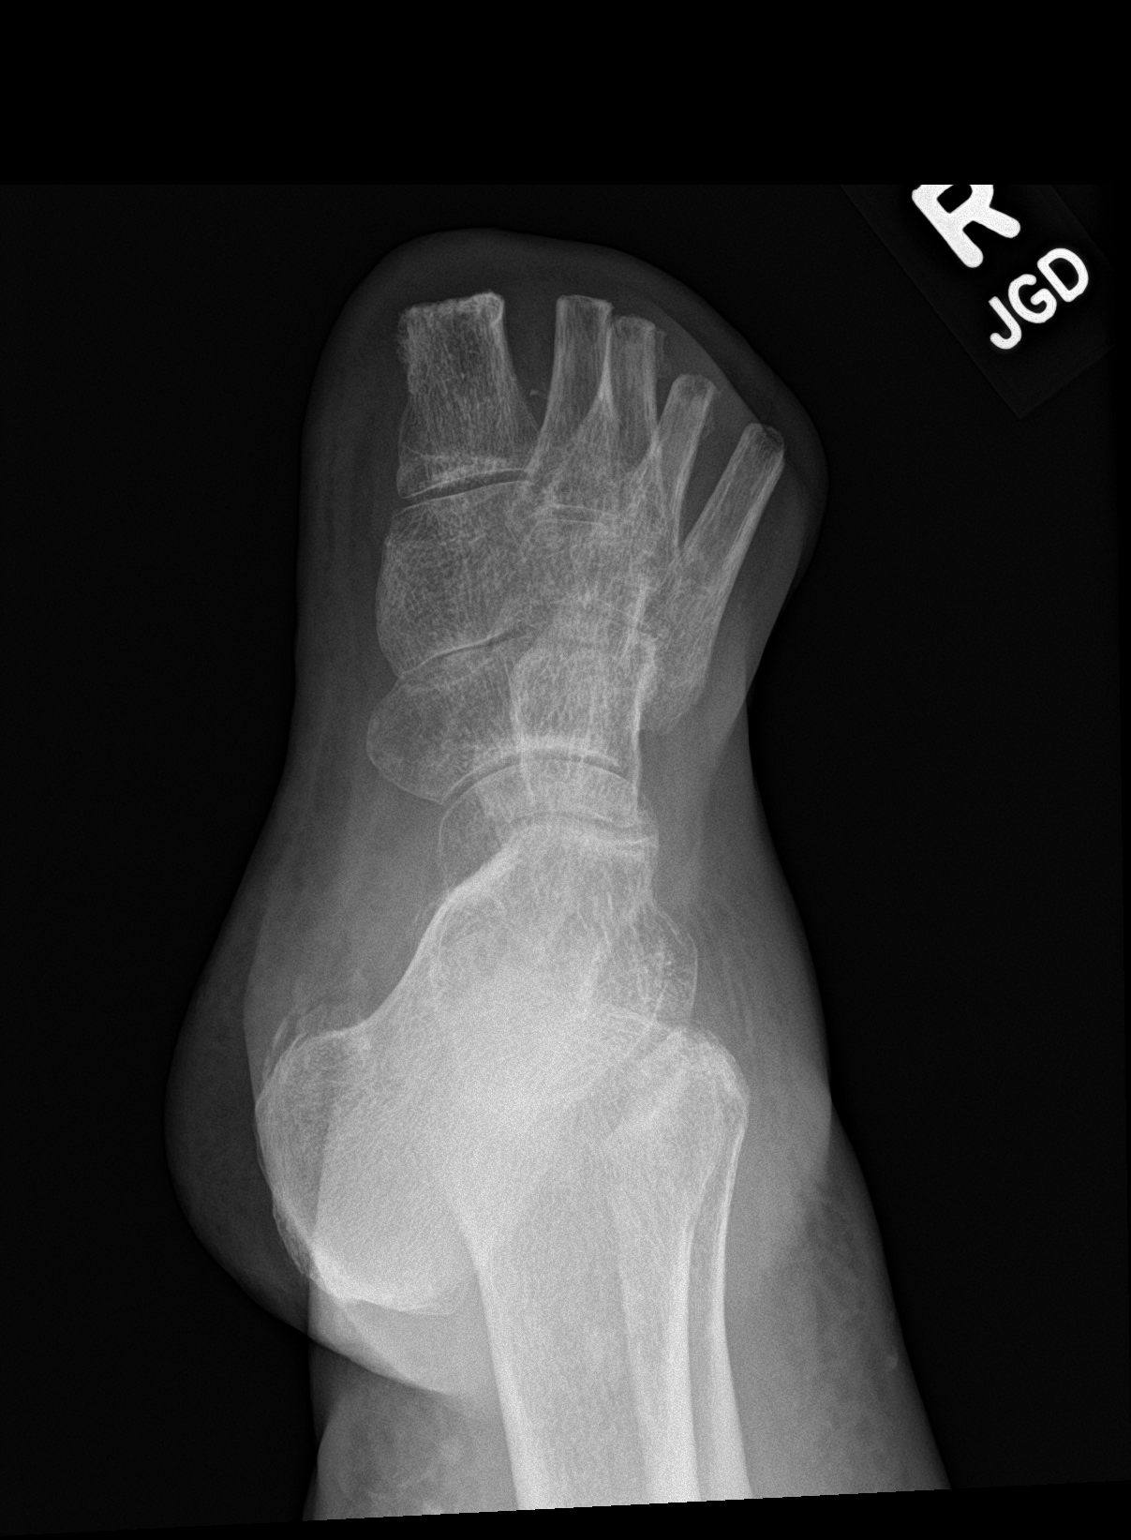

[foot obl]
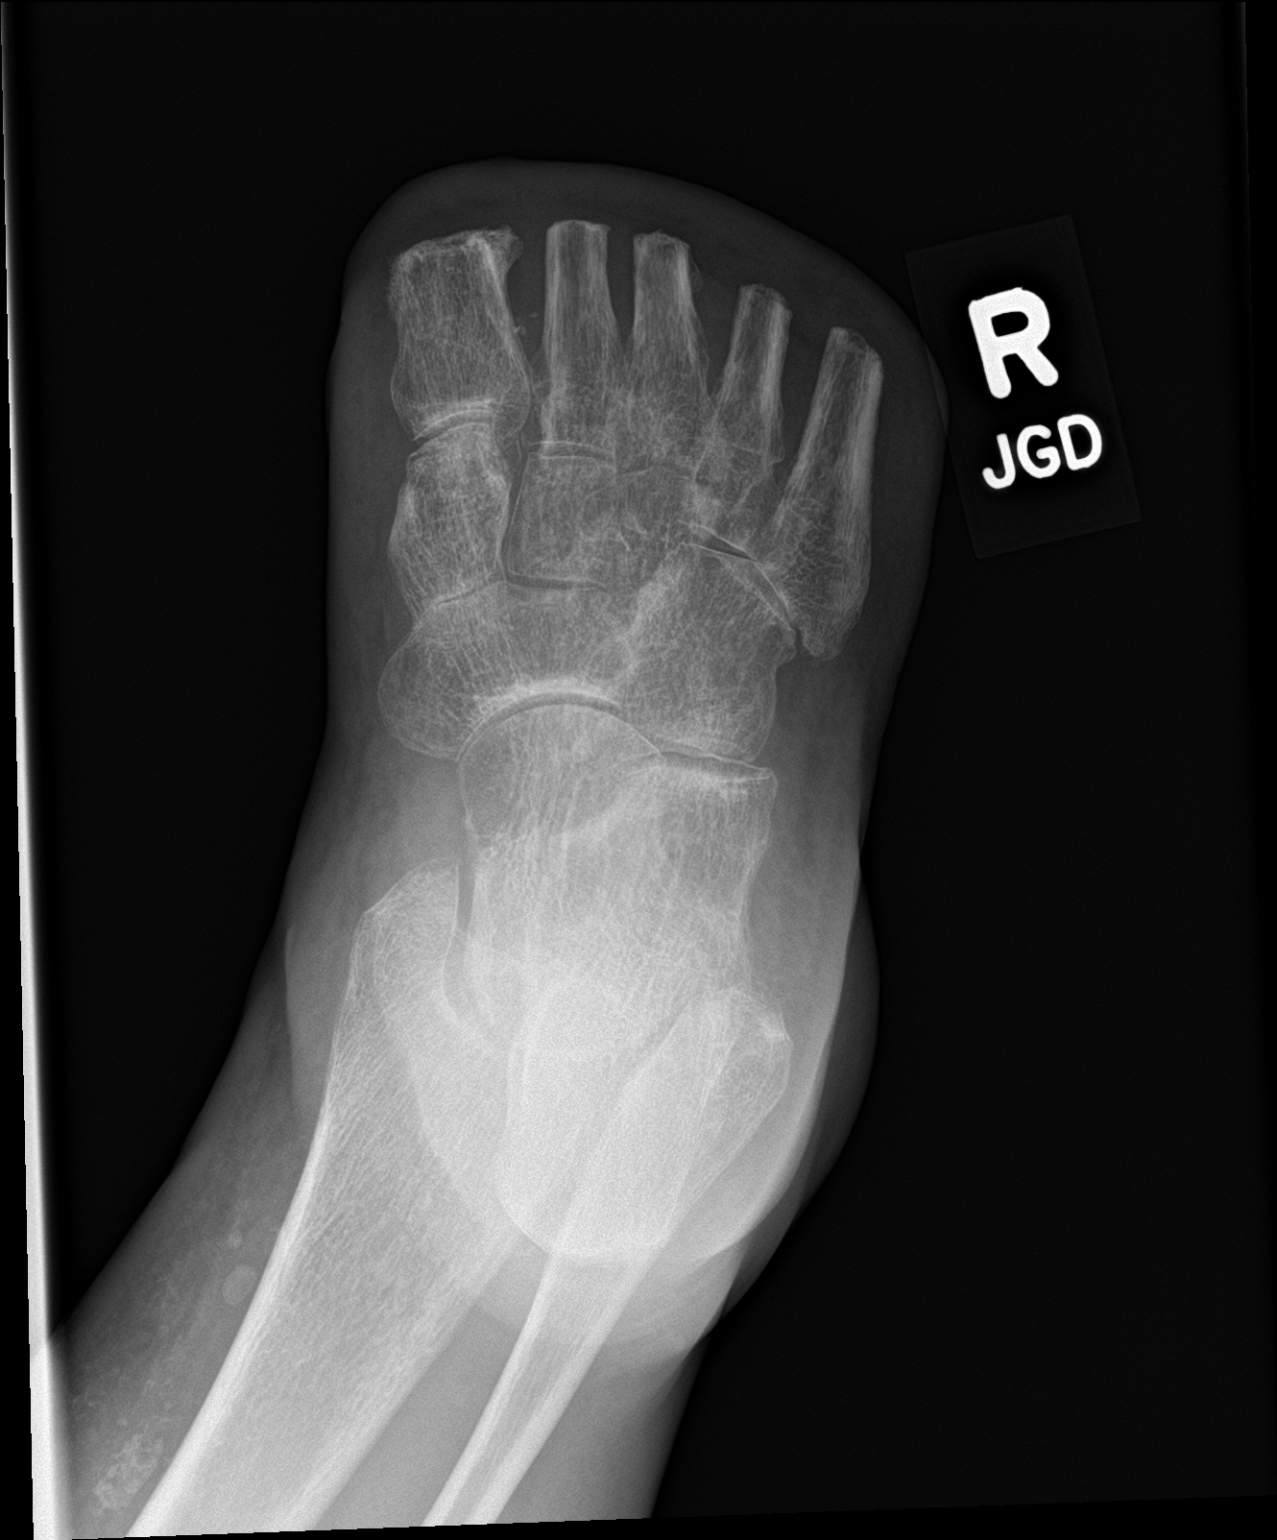

[foot lat]
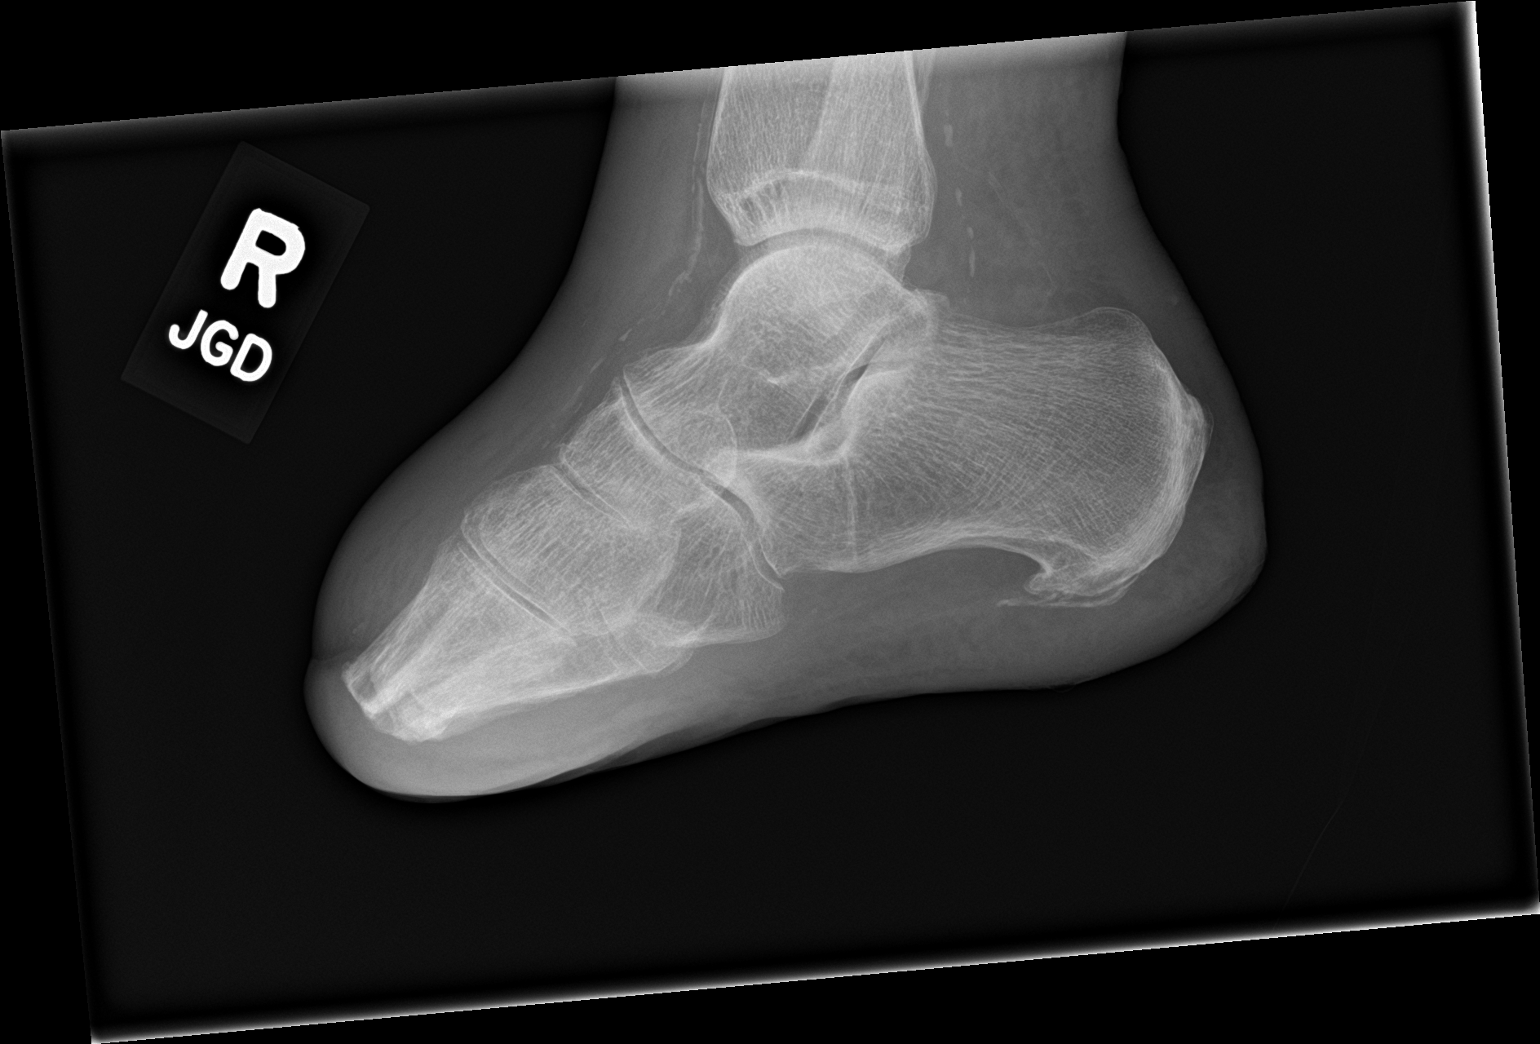

[3 of 3 positions shown; findings below may reference images not displayed]

FINDINGS: Interval transmetatarsal amputation of all 5 rays. Resection margins
are smooth. No fracture, erosion, or bony destruction. Bones are
subjectively under mineralized. Moderate plantar calcaneal spur.
Prominent vascular calcifications. Mild dorsal soft tissue edema. No
soft tissue air or radiopaque foreign body.
IMPRESSION: 1. Mild dorsal soft tissue edema. No soft tissue air or radiopaque
foreign body.
2. Transmetatarsal amputation of all 5 rays. No evidence of
osteomyelitis or acute osseous abnormality.

## 2021-10-01 IMAGING — DX DG TOE 4TH 2+V*L*
3 series · 3 of 3 positions shown · non-contrast
Comparison: None.

CLINICAL DATA: Left toe wound.

EXAM:
LEFT FOURTH TOE

[toe ap]
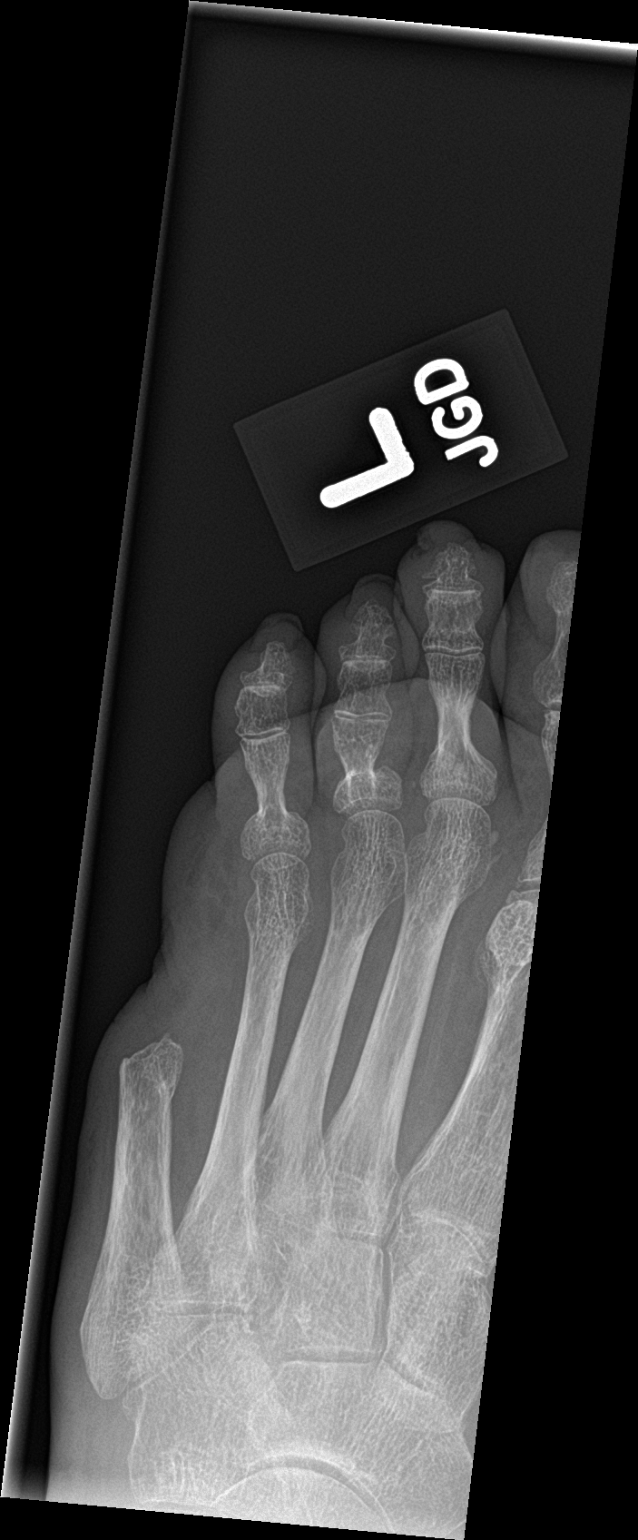

[toe obl]
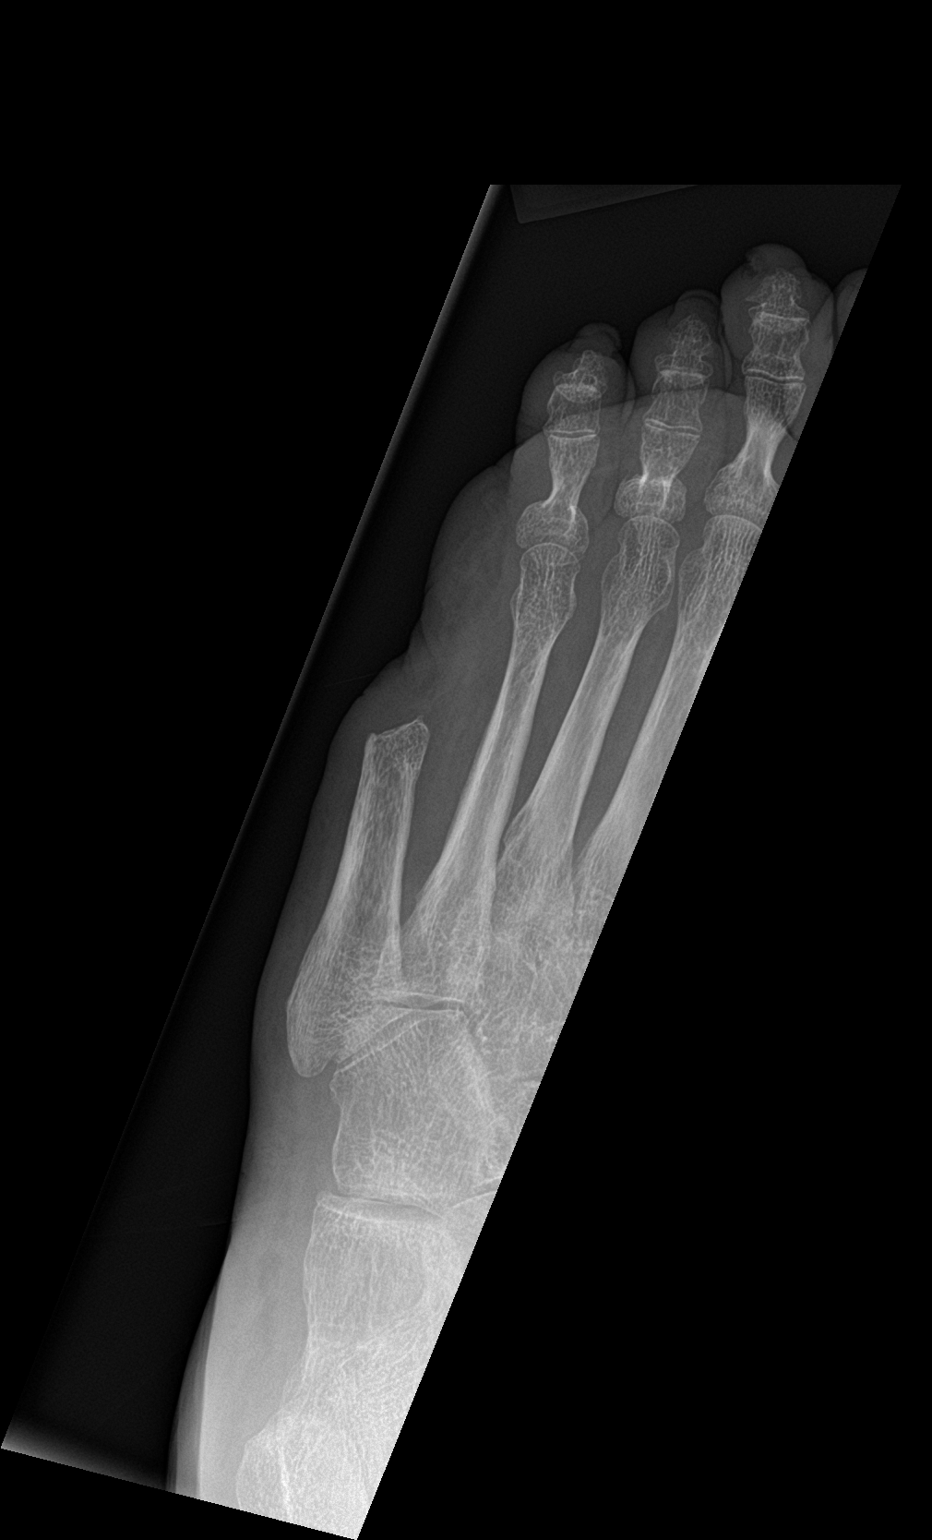

[toe lat]
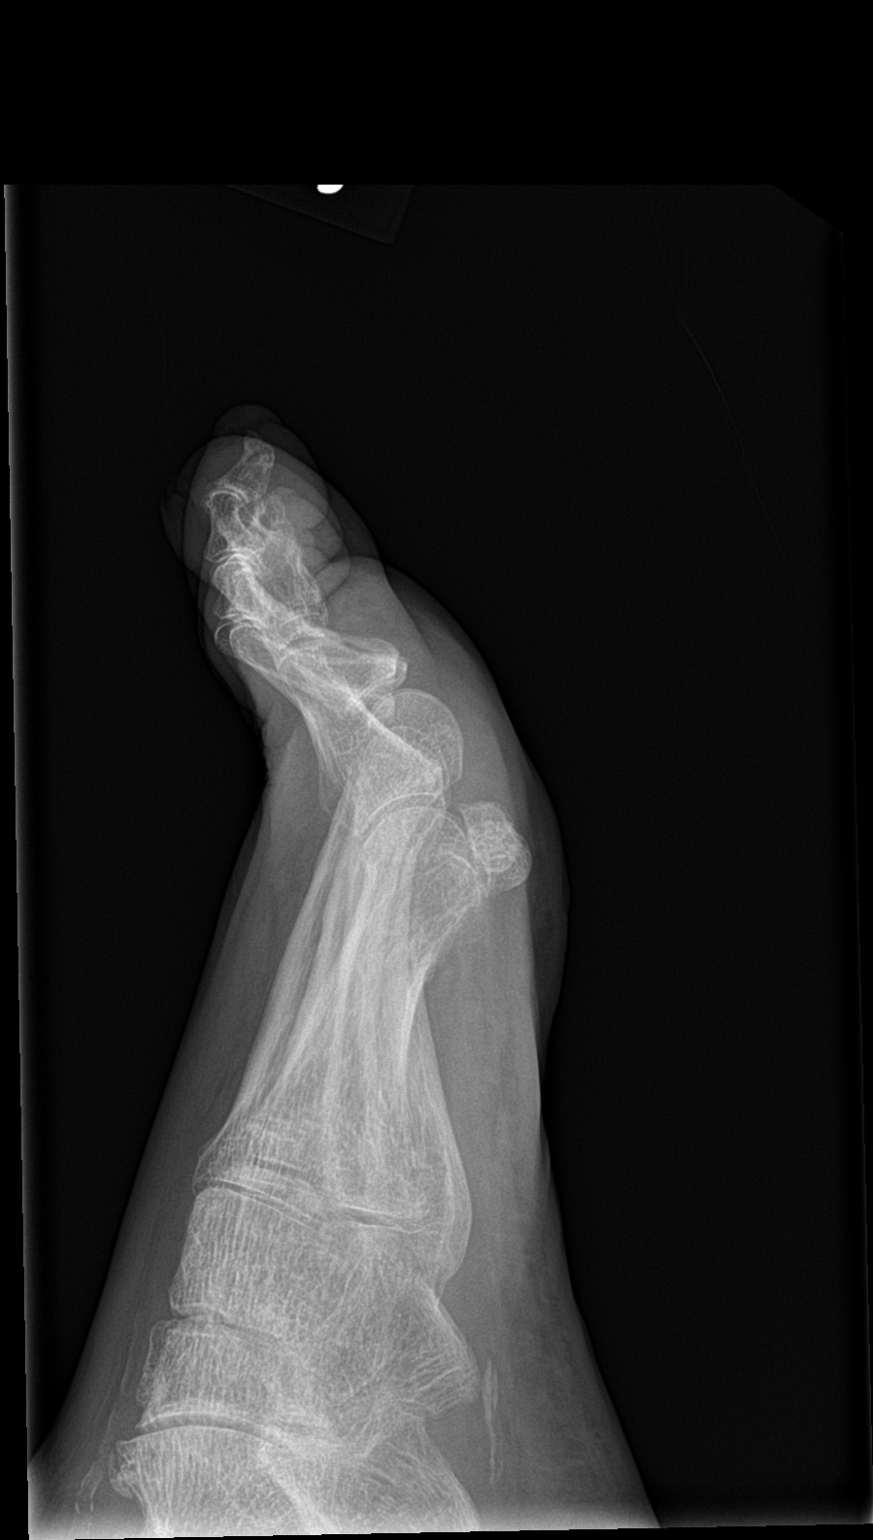

[3 of 3 positions shown; findings below may reference images not displayed]

FINDINGS: The site of fourth toe wound is not well demonstrated by radiograph.
There is no evidence of bony destruction, erosion, fracture or
periosteal reaction. Remote fifth transmetatarsal amputation. No
tracking soft tissue air. No radiopaque foreign body. Lateral view
limited by osseous overlap.
IMPRESSION: No radiographic findings of osteomyelitis. The site of fourth toe
wound is not well demonstrated by radiograph.

## 2021-10-04 ENCOUNTER — Telehealth (HOSPITAL_COMMUNITY): Payer: Self-pay

## 2021-10-04 DIAGNOSIS — I739 Peripheral vascular disease, unspecified: Secondary | ICD-10-CM | POA: Diagnosis not present

## 2021-10-04 DIAGNOSIS — J449 Chronic obstructive pulmonary disease, unspecified: Secondary | ICD-10-CM | POA: Diagnosis not present

## 2021-10-04 DIAGNOSIS — N2581 Secondary hyperparathyroidism of renal origin: Secondary | ICD-10-CM | POA: Diagnosis not present

## 2021-10-04 DIAGNOSIS — I129 Hypertensive chronic kidney disease with stage 1 through stage 4 chronic kidney disease, or unspecified chronic kidney disease: Secondary | ICD-10-CM | POA: Diagnosis not present

## 2021-10-04 DIAGNOSIS — E1122 Type 2 diabetes mellitus with diabetic chronic kidney disease: Secondary | ICD-10-CM | POA: Diagnosis not present

## 2021-10-04 DIAGNOSIS — N1832 Chronic kidney disease, stage 3b: Secondary | ICD-10-CM | POA: Diagnosis not present

## 2021-10-04 DIAGNOSIS — N189 Chronic kidney disease, unspecified: Secondary | ICD-10-CM | POA: Diagnosis not present

## 2021-10-04 DIAGNOSIS — F17200 Nicotine dependence, unspecified, uncomplicated: Secondary | ICD-10-CM | POA: Diagnosis not present

## 2021-10-04 NOTE — Telephone Encounter (Signed)
Contacted pharmacy to order the following meds:  Allopurinol Amio-last fill on 10/31 Atorvastatin- last fill on 10/26 Clopidogrel Jardiance  Fluoxetine- last fill 90 day 8/31 Hydralazine-last fill on 10/26  Nortriptyline  Pantoprazole  Torsemide    I asked pt to get her PCP to reorder her thyroid med but she told PCP that she has not been taking it, which she has been, [facility] was [facility] and we needed it to come to local pharmacy.    Marylouise Stacks, EMT-Paramedic  10/04/21

## 2021-10-05 ENCOUNTER — Encounter (HOSPITAL_BASED_OUTPATIENT_CLINIC_OR_DEPARTMENT_OTHER): Payer: Medicare Other | Admitting: Internal Medicine

## 2021-10-05 ENCOUNTER — Other Ambulatory Visit (HOSPITAL_COMMUNITY): Payer: Self-pay | Admitting: *Deleted

## 2021-10-05 ENCOUNTER — Other Ambulatory Visit: Payer: Self-pay | Admitting: Family Medicine

## 2021-10-05 DIAGNOSIS — F32A Depression, unspecified: Secondary | ICD-10-CM

## 2021-10-05 MED ORDER — TORSEMIDE 20 MG PO TABS: ORAL_TABLET | ORAL | 6 refills | Status: DC

## 2021-10-05 MED ORDER — CLOPIDOGREL BISULFATE 75 MG PO TABS: 75.0000 mg | ORAL_TABLET | Freq: Every day | ORAL | 11 refills | Status: DC

## 2021-10-05 NOTE — Telephone Encounter (Signed)
Rx written 07/14/2021 #90 with 1 refill. Pt should have enough medication to last until 12/2021.

## 2021-10-06 ENCOUNTER — Other Ambulatory Visit (HOSPITAL_COMMUNITY): Payer: Self-pay

## 2021-10-06 NOTE — Progress Notes (Addendum)
Paramedicine Encounter    Patient ID: [name], female    DOB: Apr 16, 1961, 60 y.o.   MRN: [id number]   Patient Care Team: Charlott Rakes, MD as PCP - General (Family Medicine) [name], MD as PCP - Cardiology (Cardiology) [name], MD as PCP - Electrophysiology (Cardiology) [name], MD as Consulting Physician (Cardiology) [name], MD as Consulting Physician (Pulmonary Disease) System, Provider Not In  Patient Active Problem List   Diagnosis Date Noted   Hyperlipidemia associated with type 2 diabetes mellitus ([place]) 07/29/2021   (HFpEF) heart failure with preserved ejection fraction ([place]) 07/29/2021   Bradycardia 06/13/2021   Uncontrolled type 2 diabetes mellitus with hyperglycemia ([place]) 06/13/2021   Acute kidney injury superimposed on CKD ([place]) 06/13/2021   Critical ischemia of foot ([place]) 06/07/2021   Fall 05/05/2021   PAF (paroxysmal atrial fibrillation) ([place]) 05/05/2021   COPD (chronic obstructive pulmonary disease) ([place]) 05/05/2021   DM (diabetes mellitus), type 2 with complications ([place]) 11/94/1740   PAD (peripheral artery disease) ([place]) 05/05/2021   Cellulitis 05/05/2021   Acute on chronic combined systolic and diastolic CHF (congestive heart failure) ([place]) 05/05/2021   OSA (obstructive sleep apnea) 05/05/2021   CKD (chronic kidney disease) stage 3, GFR 30-59 ml/min ([place]) 05/05/2021   AKI (acute kidney injury) ([place]) 05/04/2021   Pressure injury of skin 05/04/2021   Ulcer of foot, unspecified laterality, limited to breakdown of skin ([place]) 02/17/2021   Altered mental status 01/02/2021   AF (paroxysmal atrial fibrillation) ([place]) 01/02/2021   CAD (coronary artery disease) 01/02/2021   PVD (peripheral vascular disease) ([place]) 01/02/2021   Acute renal failure superimposed on stage 4 chronic kidney disease ([place]) 01/02/2021   Diabetes mellitus type 2, uncontrolled, with complications 81/44/8185   Diabetic nephropathy ([place]) 01/02/2021   Low  back pain 06/01/2020   Hyperlipidemia 04/03/2020   Muscle weakness 03/20/2020   Closed fracture of neck of right radius 03/13/2020   Pain in elbow 03/12/2020   PAD (peripheral artery disease) ([place]) 12/26/2019   Acute renal failure ([place])    Acute respiratory failure with hypoxia ([place]) 12/02/2019   Acute exacerbation of CHF (congestive heart failure) ([place]) 12/01/2019   Cellulitis in diabetic foot ([place]) 07/08/2019   Osteomyelitis ([place]) 06/21/2019   NICM (nonischemic cardiomyopathy) ([place]) 06/20/2019   Non-healing ulcer ([place]) 06/20/2019   Acute CHF (congestive heart failure) ([place]) 11/26/2018   CHF (congestive heart failure) ([place]) 11/26/2018   Acute respiratory failure ([place]) 10/21/2018   AKI (acute kidney injury) ([place]) 08/18/2018   Coagulation disorder ([place]) 08/09/2017   Depression 07/21/2017   At risk for adverse drug reaction 06/20/2017   Peripheral neuropathy 06/20/2017   Acute osteomyelitis of right foot ([place]) 06/13/2017   S/P transmetatarsal amputation of foot, right ([place]) 06/05/2017   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 ([place]) 05/19/2017   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[place])    SIRS (systemic inflammatory response syndrome) ([place]) 04/06/2017   CKD (chronic kidney disease), stage III ([place]) 11/24/2016   Suspected sleep apnea 11/24/2016   Ulcer of toe of right foot, with necrosis of bone ([place]) 10/27/2016   Ulcer of left lower leg ([place]) 05/19/2016   Severe obesity (BMI >= 40) ([name]) 02/24/2016   COPD GOLD 0  02/24/2016   Morbid obesity ([place]) 02/24/2016   Encounter for therapeutic drug monitoring 02/10/2016   Symptomatic bradycardia 01/12/2016   Hypertension associated with diabetes ([place]) 12/22/2015   Chronic combined systolic and diastolic CHF (congestive heart failure) (  [place])    [place]    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 11/08/2015   Tobacco abuse 10/23/2015   Coronary artery disease    DOE (dyspnea on exertion) 04/29/2015    Diabetes mellitus type 2, uncontrolled 02/08/2015   Influenza A 02/07/2015   Wrist fracture, left, with routine healing, subsequent encounter 02/05/2015   Wrist fracture, left, closed, initial encounter 01/29/2015   PAF (paroxysmal atrial fibrillation) ([place]) 01/16/2015   Carotid arterial disease ([place]) 01/16/2015   Claudication ([place]) 01/15/2015   Demand ischemia ([place]) 10/29/2014   Insomnia 02/03/2014   COPD with acute exacerbation ([place]) 02/01/2014   S/P peripheral artery angioplasty - TurboHawk atherectomy; R SFA 09/11/2013    Class: Acute   Leg pain, bilateral 08/19/2013   Hypothyroidism 07/31/2013   Cellulitis 06/13/2013   History of cocaine abuse ([place]) 06/13/2013   Long term current use of anticoagulant therapy 05/20/2013   Alcohol abuse    Narcotic abuse ([place])    Marijuana abuse    Alcoholic cirrhosis ([name])    DM (diabetes mellitus), type 2 with peripheral vascular complications ([name])     Current Outpatient Medications:    ACCU-CHEK GUIDE test strip, USE TO CHECK BLOOD SUGAR FOUR TIMES DAILY, Disp: , Rfl:    acetaminophen (TYLENOL) 500 MG tablet, Take 500 mg by mouth every 6 (six) hours as needed for moderate pain., Disp: , Rfl:    albuterol (VENTOLIN HFA) 108 (90 Base) MCG/ACT inhaler, Inhale 2 puffs into the lungs every 6 (six) hours as needed for wheezing or shortness of breath., Disp: 18 g, Rfl: 3   allopurinol (ZYLOPRIM) 100 MG tablet, Take 1 tablet (100 mg total) by mouth daily., Disp: 30 tablet, Rfl: 6   amiodarone (PACERONE) 200 MG tablet, TAKE 1/2 TABLET (100 MG TOTAL) BY MOUTH DAILY., Disp: 15 tablet, Rfl: 3   amLODipine (NORVASC) 10 MG tablet, Take 1 tablet (10 mg total) by mouth daily., Disp: 90 tablet, Rfl: 3   apixaban (ELIQUIS) 5 MG TABS tablet, Take 5 mg by mouth 2 (two) times daily., Disp: , Rfl:    atorvastatin (LIPITOR) 40 MG tablet, Take 40 mg by mouth every evening., Disp: , Rfl:    budesonide-formoterol (SYMBICORT) 160-4.5 MCG/ACT inhaler, Inhale 2 puffs into  the lungs 2 (two) times daily., Disp: 1 Inhaler, Rfl: 2   clopidogrel (PLAVIX) 75 MG tablet, Take 1 tablet (75 mg total) by mouth daily., Disp: 30 tablet, Rfl: 11   colchicine 0.6 MG tablet, Take 2 tabs (1.2 mg) at the onset of a gout flare, may take 1 tab (0.6 mg) in 1 hour if symptoms, Disp: 30 tablet, Rfl: 3   Dulaglutide (TRULICITY) 1.5 MB/5.5HR SOPN, Inject 1.5 mg into the skin once a week., Disp: 2 mL, Rfl: 6   empagliflozin (JARDIANCE) 10 MG TABS tablet, Take 1 tablet (10 mg total) by mouth daily before breakfast., Disp: 90 tablet, Rfl: 3   FLUoxetine (PROZAC) 20 MG capsule, Take 1 capsule (20 mg total) by mouth at bedtime., Disp: 90 capsule, Rfl: 1   folic acid (FOLVITE) 1 MG tablet, Take 1 tablet (1 mg total) by mouth daily., Disp: 90 tablet, Rfl: 1   hydrALAZINE (APRESOLINE) 50 MG tablet, Take 1 tablet (50 mg total) by mouth 3 (three) times daily., Disp: 90 tablet, Rfl: 6   insulin glargine (LANTUS) 100 UNIT/ML Solostar Pen, Inject 30 Units into the skin daily., Disp: 30 mL, Rfl: 6   insulin lispro (HUMALOG) 100 UNIT/ML KwikPen, Inject 8 Units into the skin 3 (three)  times daily with meals., Disp: 15 mL, Rfl: 0   Insulin Pen Needle 32G X 4 MM MISC, USE AS DIRECTED WITH INSULIN PENS, Disp: 100 each, Rfl: 0   levothyroxine (SYNTHROID, LEVOTHROID) 50 MCG tablet, Take 1 tablet (50 mcg total) by mouth daily., Disp: 30 tablet, Rfl: 0   losartan (COZAAR) 25 MG tablet, Take 2 tablets (50 mg total) by mouth daily. (Patient taking differently: Take 50 mg by mouth at bedtime.), Disp: 180 tablet, Rfl: 1   nortriptyline (PAMELOR) 10 MG capsule, Take 1 capsule (10 mg total) by mouth every other day for 14 days, THEN 1 capsule (10 mg total) 2 (two) times a week for 14 days. Then discontinue., Disp: 11 capsule, Rfl: 0   Omega-3 Fatty Acids (FISH OIL PO), Take 1 capsule by mouth daily., Disp: , Rfl:    ondansetron (ZOFRAN) 4 MG tablet, Take 1 tablet (4 mg total) by mouth every 8 (eight) hours as needed for  nausea or vomiting., Disp: 20 tablet, Rfl: 0   pantoprazole (PROTONIX) 40 MG tablet, Take 1 tablet (40 mg total) by mouth daily., Disp:  , Rfl:    torsemide (DEMADEX) 20 MG tablet, Take 4 tablets (80 mg total) by mouth in the morning AND 3 tablets (60 mg total) every evening., Disp: 210 tablet, Rfl: 6   VITAMIN D PO, Take 1 tablet by mouth daily., Disp: , Rfl:    benzonatate (TESSALON) 100 MG capsule, Take 1 capsule (100 mg total) by mouth 2 (two) times daily as needed for cough. (Patient not taking: Reported on 10/06/2021), Disp: 40 capsule, Rfl: 0   Continuous Blood Gluc Sensor (FREESTYLE LIBRE 2 SENSOR) MISC, USE 1 (ONE) EACH EVERY 2 WEEKS (Patient not taking: Reported on 08/19/2021), Disp: 2 each, Rfl: 11   Misc. Devices MISC, 2 L oxygen continuously via nasal cannula.  Diagnosis chronic respiratory failure with hypoxia (Patient not taking: Reported on 09/02/2021), Disp: 1 each, Rfl: 0   predniSONE (DELTASONE) 20 MG tablet, Take 2 tablets (40 mg total) by mouth daily with breakfast. (Patient not taking: Reported on 08/19/2021), Disp: 8 tablet, Rfl: 0   sulfamethoxazole-trimethoprim (BACTRIM DS) 800-160 MG tablet, Take 1 tablet by mouth 2 (two) times daily. (Patient not taking: Reported on 10/06/2021), Disp: , Rfl:  Allergies  Allergen Reactions   Gabapentin Nausea And Vomiting and Other (See Comments)    POSSIBLE SHAKING   Lyrica [Pregabalin] Other (See Comments)    Shaking       Social History   Socioeconomic History   Marital status: Single    Spouse name: Not on file   Number of children: 1   Years of education: 12   Highest education level: 12th grade  Occupational History   Occupation: disabled  Tobacco Use   Smoking status: Every Day    Packs/day: 1.00    Years: 44.00    Pack years: 44.00    Types: E-cigarettes, Cigarettes   Smokeless tobacco: Former    Types: Snuff  Vaping Use   Vaping Use: Former   Devices: 11/26/2018 "stopped months ago"  Substance and Sexual  Activity   Alcohol use: Yes    Comment: occ   Drug use: Yes    Types: "Crack" cocaine, Marijuana, Oxycodone   Sexual activity: Not Currently  Other Topics Concern   Not on file  Social History Narrative   ** Merged History Encounter **       Lives in [place], in motel with sister.  They are looking to  move but don't have a place to go yet.     Social Determinants of Health   Financial Resource Strain: Not on file  Food Insecurity: Not on file  Transportation Needs: Not on file  Physical Activity: Not on file  Stress: Not on file  Social Connections: Not on file  Intimate Partner Violence: Not on file    Physical Exam      Future Appointments  Date Time Provider [place]  10/12/2021  2:30 PM Ricard Dillon, MD [facility]  10/21/2021  1:40 PM Larey Dresser, MD [facility] None  11/16/2021  3:30 PM Charlott Rakes, MD [facility] None  12/22/2021  1:00 PM [facility] VASC 3 [facility]  01/11/2022  3:30 PM [name], MD [facility]    BP (!) 154/66   Pulse 88   Resp 18   Wt 212 lb (96.2 kg)   LMP  (LMP Unknown)   SpO2 97%   BMI 36.39 kg/m  CBG PTA-CBG EMS-HI  She missed her insulin last night and this morning.  She took 16U of humalog now.  Advised her to keep snack handy in case it drops too low too quick and to check her CBG soon.   Weight yesterday-? Last visit weight-226  Pt reports she has not been feeling well, she has been vomiting since yesterday morning.  Her trulicity was increased about a month ago. But she denies the first 2 injections made her feel sick.  She denies diarrhea.  Zema told the doc she isnt taking levo, but she is mistaken, she is taking 45mcg. Asked the doc again to send this in and the pt was wrong about telling her that.  After this week she will be out of levo.  She needs nortriptyline to finish out her week to complete the weaning of this.   Meds verified and 1 week of meds filled.   Refills  needed-  Amio Ator Levo nortrip Pantoprazole   [name], [place] Paramedic  10/06/21

## 2021-10-06 NOTE — Telephone Encounter (Signed)
Last refill 10/05/21  Requested Prescriptions  Refused Prescriptions Disp Refills  . FLUoxetine (PROZAC) 20 MG capsule [Pharmacy Med Name: FLUOXETINE HCL 20 MG ORAL CAPSULE] 90 capsule 1    Sig: TAKE 1 CAPSULE (20 MG TOTAL) BY MOUTH AT BEDTIME.     Psychiatry:  Antidepressants - SSRI Failed - 10/05/2021  4:19 PM      Failed - Completed PHQ-2 or PHQ-9 in the last 360 days      Passed - Valid encounter within last 6 months    Recent Outpatient Visits          1 week ago DM (diabetes mellitus), type 2 with peripheral vascular complications [facility])   [place], [place], [place]   1 month ago DM (diabetes mellitus), type 2 with peripheral vascular complications [facility])   [place], Charlane Ferretti, MD   2 months ago DM (diabetes mellitus), type 2 with peripheral vascular complications [facility])   [place], Charlane Ferretti, MD   6 years ago Type 2 diabetes mellitus with hyperglycemia   [place], Charlane Ferretti, MD   6 years ago Type 2 diabetes mellitus with hyperglycemia   [place], [name], MD      Future Appointments            In 1 month Charlott Rakes, MD [place]   In 3 months Gwenlyn Found, [name], MD [place], [facility]

## 2021-10-11 ENCOUNTER — Other Ambulatory Visit: Payer: Self-pay | Admitting: Physician Assistant

## 2021-10-11 ENCOUNTER — Telehealth: Payer: Self-pay

## 2021-10-11 DIAGNOSIS — E039 Hypothyroidism, unspecified: Secondary | ICD-10-CM

## 2021-10-11 MED ORDER — LEVOTHYROXINE SODIUM 50 MCG PO TABS: 50.0000 ug | ORAL_TABLET | Freq: Every day | ORAL | 1 refills | Status: DC

## 2021-10-11 NOTE — Telephone Encounter (Signed)
This message was received from Marylouise Stacks, EMT and shared with Roney Jaffe Clung, PA as staff message:    Can you relay that she HAS been taking levothyroxine for quite some time now, she has been getting the [facility] and now we have moved to [place] she  needs a new rx so he can fill it there.   She takes 26mcg.   I know N[place] told her she hasnt been taking it, but she really isnt aware of her meds and what she takes. If I am not here tending to her meds then its her sister.  [name] is not a good historian for her meds.      Ms [name], [place] has sent refill of  levothyroxine to [facility].

## 2021-10-11 NOTE — Telephone Encounter (Signed)
Message received from Marylouise Stacks, EMT requesting refills for the following:  Atorvastatin  Pantoprazole  Nortriptyline-10mg  dose   this should be the last fill on this med    The refills should go to [facility]

## 2021-10-12 ENCOUNTER — Encounter (HOSPITAL_BASED_OUTPATIENT_CLINIC_OR_DEPARTMENT_OTHER): Payer: Medicare Other | Admitting: Internal Medicine

## 2021-10-12 ENCOUNTER — Other Ambulatory Visit: Payer: Self-pay

## 2021-10-12 DIAGNOSIS — Z89431 Acquired absence of right foot: Secondary | ICD-10-CM | POA: Diagnosis not present

## 2021-10-12 DIAGNOSIS — I872 Venous insufficiency (chronic) (peripheral): Secondary | ICD-10-CM | POA: Diagnosis not present

## 2021-10-12 DIAGNOSIS — F17210 Nicotine dependence, cigarettes, uncomplicated: Secondary | ICD-10-CM | POA: Diagnosis not present

## 2021-10-12 DIAGNOSIS — E11621 Type 2 diabetes mellitus with foot ulcer: Secondary | ICD-10-CM | POA: Diagnosis not present

## 2021-10-12 DIAGNOSIS — L97512 Non-pressure chronic ulcer of other part of right foot with fat layer exposed: Secondary | ICD-10-CM | POA: Diagnosis not present

## 2021-10-12 DIAGNOSIS — E1151 Type 2 diabetes mellitus with diabetic peripheral angiopathy without gangrene: Secondary | ICD-10-CM | POA: Diagnosis not present

## 2021-10-12 DIAGNOSIS — M86671 Other chronic osteomyelitis, right ankle and foot: Secondary | ICD-10-CM | POA: Diagnosis not present

## 2021-10-12 DIAGNOSIS — L97518 Non-pressure chronic ulcer of other part of right foot with other specified severity: Secondary | ICD-10-CM | POA: Diagnosis not present

## 2021-10-12 DIAGNOSIS — E1169 Type 2 diabetes mellitus with other specified complication: Secondary | ICD-10-CM | POA: Diagnosis not present

## 2021-10-12 NOTE — Progress Notes (Signed)
**Note Karla-Identified via Obfuscation** [name], Price ([id number]) Visit Report for 10/12/2021 Debridement Details Patient Name: Date of Service: [name] 10/12/2021 2:30 PM Medical Record Number: [id number] Patient Account Number: [id number] Date of Birth/Sex: Treating RN: [birth year] (60 y.o. [name], [name] Primary Care Provider: Charlott Rakes Other Clinician: Referring Provider: Treating Provider/Extender: Sindy Guadeloupe Weeks in Treatment: 33 Debridement Performed for Assessment: Wound #16 Right,Plantar Amputation Site - Transmetatarsal Performed By: Physician Ricard Dillon., MD Debridement Type: Debridement Severity of Tissue Pre Debridement: Fat layer exposed Level of Consciousness (Pre-procedure): Awake and Alert Pre-procedure Verification/Time Out Yes - 15:50 Taken: Start Time: 15:50 Pain Control: Lidocaine T Area Debrided (L x W): otal 0.9 (cm) x 0.8 (cm) = 0.72 (cm) Tissue and other material debrided: Viable, Non-Viable, Slough, Subcutaneous, Skin: Dermis , Skin: Epidermis, Slough Level: Skin/Subcutaneous Tissue Debridement Description: Excisional Instrument: Curette Bleeding: Minimum Hemostasis Achieved: Pressure End Time: 15:50 Procedural Pain: 0 Post Procedural Pain: 0 Response to Treatment: Procedure was tolerated well Level of Consciousness (Post- Awake and Alert procedure): Post Debridement Measurements of Total Wound Length: (cm) 0.9 Width: (cm) 0.8 Depth: (cm) 0.4 Volume: (cm) 0.226 Character of Wound/Ulcer Post Debridement: Improved Severity of Tissue Post Debridement: Fat layer exposed Post Procedure Diagnosis Same as Pre-procedure Electronic Signature(s) Signed: 10/12/2021 5:06:47 PM By: Linton Ham MD Signed: 10/12/2021 5:11:56 PM By: [name] RN Entered By: Linton Ham on 10/12/2021 16:35:50 -------------------------------------------------------------------------------- HPI Details Patient Name: Date of Service: [name], Karla  [name] 10/12/2021 2:30 PM Medical Record Number: [id number] Patient Account Number: [id number] Date of Birth/Sex: Treating RN: [birth year] (60 y.o. [name] Primary Care Provider: Charlott Rakes Other Clinician: Referring Provider: Treating Provider/Extender: Sindy Guadeloupe Weeks in Treatment: 69 History of Present Illness HPI Description: This 60 year old patient who has a very long significant history of diabetes mellitus, previous alcohol and nicotine abuse, chronic data disease, COPD, diabetes mellitus, height hypertension, critical lower limb ischemia with several wounds being managed at the wound Center at [facility] for over a year. She was recently in the ER at [facility] and was referred to our center. He has had a long history of critical limb ischemia and over a period of time has had balloon angioplasties in March 2016, endarterectomy of the left carotid, lower extremity angiogram and treatment by Dr. Quay Burow, several cardiac catheterizations. Most recently she had an x-ray of her left foot while in the ER which showed no acute abnormality. during this ER visit she was started on ciprofloxacin and asked to continue with the wound care physicians at [facility]. Her last ABI done in June 2017 showed the right side to be 0.28 in the left side to be 0.48. Her right toe brachial index was 0.17 on the right and 0.27 on the left. her last hemoglobin A1c was 11.1% She continues to smoke about a pack of cigarettes a day. 03/28/2017 -- -- right foot x-ray -- IMPRESSION:Areas of soft tissue swelling. Mild subluxation second PIP joint. No frank dislocation or fracture. No erosive change or bony destruction. No soft tissue ulceration or radiopaque foreign body evident by radiography. There is plantar fascia calcification with a nearby inferior calcaneal spur. There are foci of arterial vascular calcification. 04/04/2017 -- the patient continues to be  noncompliant and continues to complain of a lot of pain and has not done anything about quitting smoking Readmission: 06/26/18 on evaluation today patient presents for reevaluation she has not been seen in our office since May 2018.  Since she was last seen here in our office she has undergone testing at Dr. Elspeth Cho office where it was revealed that she had an ABI of 0.68 with her previous ABI being 0.64 and a left ABI of 0.38 with previous ABI being 0.34 this study which was performed on 04/05/18 was pretty much equivalent to the study performed on 11/22/17. The findings in the end revealed on the right would appear to be moderate right lower extremity arterial disease on the left Dr. Gwenlyn Found states moderate in the report but unfortunately this appears to be much more severe at 0.38 compared to the right. Subsequently the patient was scheduled to have angiography with Dr. Gwenlyn Found on 04/12/18. This was however canceled due to the fact that the patient was found to have chronic kidney disease stage III and it was to the point that he did not feel that it will be safe to pursue angiography at that point. She has not been on any antibiotics recently. At this point Dr. Gwenlyn Found has not rescheduled anything as far as the injured [place] according to the patient he is somewhat reluctant to [name]. Nonetheless with her diminished blood flow this is gonna make it somewhat difficult for her to heal. 07/05/18; this is a patient I have not seen previously. She has very significant PAD as noted above. She apparently has had revascularization efforts by Dr. Gwenlyn Found on the right on 3 occasions to the patient. She was supposed to have an attempted angiography on the left however this was canceled apparently because of stage III chronic renal failure. I will need to research all of this. She complains of significant pain in the wound and has claudication enough that when she walks to the end of the driveway she has to stop. She  is been using silver alginate to the wounds on her legs and Iodoflex to the area on her left second toe. 07/13/18 on evaluation today patient's wounds actually appear to be doing about the same. She has an appointment she tells me within the next month that is September 2019 with Dr. Gwenlyn Found to discuss options to see if there's anything else that you can recommend or Karla for her. Nonetheless obviously what we're trying to Karla is Karla what we can to save her leg and in turn prevent any additional worsening or damage. None in the meantime we been mainly trying to manage her ulcers as best we can. 07/27/18; some improvement in the multiplicity of wounds on her left lower extremity and foot. She's been using silver alginate. I was unable to determine that she actually has an appointment with Dr. Gwenlyn Found. We are checking into this The patient's wound includes Left lateral foot, left plantar heel, her left anterior calf wound looks close to me, left fourth toe is very close to closed and the left medial calf is perhaps the largest open area READMISSION 02/19/2021 This is a now 60 year old woman with type 2 diabetes and continued cigarette smoking. She has known PAD. She was last in this clinic in 2019 at that point with wounds on her left foot. She left in a nonhealed state. On 06/28/2019 I see she had a left femoropopliteal by vein and vascular with an amputation of the fifth ray. These managed to heal over. Her last arterial studies were in February 2021. This showed an absent waveform at the posterior tibial artery on the right a dampened monophasic dorsalis pedis on the right of 0.44. On the left again the  PTA TA was absent her dorsalis pedis was 0.99 triphasic and her great toe pressure was 0.65. This would have been after her revascularization. Once again she tells me that Dr. Gwenlyn Found has done revascularization on the right leg on 3 different occasions. She has a right transmetatarsal amputation apparently done  by Dr. Sharol Given remotely but I Karla not see a note for these right lower extremity revascularization but I may not of looked back far enough. In any case she says that the she has a wound on the plantar aspect of the right transmetatarsal amputation site there is been there for several months. More recently she fell and had a wound on her right medial lower leg she had 5 sutures placed in the ER and then subsequently she has developed an area on the medial lower leg which was a blister that opened up. Past medical history includes type 2 diabetes, PAD, chronic renal failure, congestive heart failure, right transmetatarsal amputation, left fifth ray amputation, continued tobacco abuse, atrial fibrillation and cirrhosis. We did not attempt an ABI on the right leg today because of pain 02/26/2021; patient we admitted to the clinic last week. She has what looks to be 2 areas on her right medial and right anterior lower leg that look more like venous wounds but she has 1 on the first met head at the base of her right transmet. She has known severe PAD. She complains of a lot of pain although some of this may be neuropathic. Is difficult to exclude a component of claudication. Use silver alginate on the wounds on her legs and Iodoflex on the area on the first met head. She has an appointment with Dr. Gwenlyn Found on 5/25 although I will text him and discussed the situation. She is have poorly controlled diabetic. She has has stage IIIb chronic renal failure 4/22; patient presents for 1 week follow-up. The 2 areas on her right medial and right anterior lower leg appear well-healing. She has been using silver alginate to this area without issues. She has a first met head ulcer and it is unclear how long this has been there as it was discovered in the ED earlier this month when she was being evaluated for another issue. Iodoflex has been used at this area. 4/29; patient presents for 1 week follow-up. She has been using  silver alginate to the leg wounds and Iodoflex to the plantar foot wound. She has had this wrapped with Coban and Kerlix. She has no concerns or complaints today. 5/6; this is a difficult wound on the right plantar foot transmit site. We are not making a lot of progress. She had 2 more venous looking wounds on the right medial and right anterior lower leg 1 of which is healed. Her appointment with Dr. Gwenlyn Found is on 5/25 5/16; she did not tolerate the offloading shoe we gave her in fact she had a fall with an abrasion on her right forearm she is now back in the modified small shoe which does not offload her foot properly. Her appoint with Dr. Gwenlyn Found is still on 5/25 we have been using Iodoflex. The area on the right anterior lower leg is healed 7/18; patient presents for follow-up however has not been seen in 2 months. She was last seen at the end of May. She had a fall at the end of June and was hospitalized. She has been unable to follow-up since then. For the wound she has only been keeping it covered with gauze. She  is scheduled for an aortogram with lower extremity runoff at the end of July. 7/29; patient presents for 2-week follow-up. She has home health and they have been changing her dressings. She denies any issues and has no complaints today. She states she had a procedure where they opened up one of her vessels in her legs. She denies signs of infection. 8/5; patient presents for follow-up. She was recently in the hospital for bradycardia. She was noted to have cellulitis to the left lower extremity and Unna boot was placed due to blisters and increased swelling. She reports improvement to her left leg since being in the hospital and stability check her right plantar foot wound. She denies signs of infection. 8/23; since I last saw this patient a lot has happened. She still has the area over the right first met head in the setting of previous transmetatarsal amputation. Firstly most  importantly she underwent revascularization of her occluded right SFA. She underwent directional atherectomy followed by drug-coated balloon angioplasty. She has no named vessel below the knee hopefully the revascularization of the right SFA will improve collateral flow. It is not felt however that she has any endovascular options. She had follow-up arterial studies noninvasive on 8/9. These showed an ABI of 0.44 at the right PTA. Monophasic waveforms. On the left her great toe ABI was 0.68. Her follow-up arterial Doppler showed a 50 to 74% stenosis in the proximal SFA and a 50 to 74% stenosis in the distal SFA mild to severe atherosclerosis noted throughout the extremity. Areas of shadowing plaque seen, unable to rule out higher grade stenosis she also had a fall apparently wearing a right foot forefoot off loader that we gave her. She therefore comes in in an ordinary running shoe today. Not certain if this is the fall that ended up in the hospital with bradycardia 9/6 area over the right first metatarsal head in the setting of her previous amputation and severe PAD. She is also a diabetic with known PAD status post attempt at revascularization. She is continued smoker Again the separation of visits in the clinic is somewhat disturbing if we are going to consider her for a total contact cast. She is not wearing anything to offload this stating it causes imbalance especially the forefoot off loader. She basically comes in in bedroom slippers She also had a fall this morning about an hour ago. She has a skin tear on the left dorsal forearm 9/13; areas over the right first metatarsal head in the setting of previous TMA and severe PAD. Again she comes in here in slippers. We have been using Hydrofera Blue. We use MolecuLight on this which was essentially negative study 9/20; right first metatarsal head in the setting of her previous TMA and severe PAD. Same slipper type shoes. She says she cannot wear a  forefoot off loader and for some reason she will not wear surgical shoes. She says she is smoking half a pack per day. 9/30; right first metatarsal head. Absolutely no improvement in fact there is tunneling superiorly very close to bone. She does not offload this properly at all. We use MolecuLight on this 2 weeks ago that did not show any surface bacteria we have been using silver collagen is a dressing She tells me she is down to 3 cigarettes a day I have offered encouragement and a treatment plan 10/6; right first metatarsal head. Exposed bone this week which is not surprising. We have been using silver collagen. She is smoking  5 to 10 cigarettes a day 10/17; she has not been here in 10 days. The bone scraping that I did on 10/6 showed staph lugdunensis, staph epidermidis and Pseudomonas Alcalifenes. I put in for Septra DS 1 p.o. twice daily for 14 days last week would be but we could not reach her to start the antibiotic. Quinolones would have been a good alternative except she has multiple drug interactions. Septra would not cover what ever the Pseudomonas is but I am not really sure of the relevance here. I am going to try her on the Septra for 2 weeks. She has a probing wound on the right TMA that probes to bone. She claims to have just about stop smoking I reviewed her arterial status. She underwent a left fem-tib bypass by Dr. Donnetta Hutching in 06/28/2019. Her last angiogram was in July of this year by Dr. Gwenlyn Found. This showed an occluded right SFA the popliteal and tibial vessels were also occluded the peroneal vessel filled by collaterals and filled the PT and DP by communicating collaterals. She was not felt to have any additional and vascular options. Dr. Gwenlyn Found noted that she he had previously revascularized her right SFA 3 times. 10/25; she is tolerating the Septra but says it makes her nauseated. I am going to continue this for an additional 2 weeks perhaps for a total of 6 weeks. Her wound does  not look too much different. There is on the right first met metatarsal head. There is probing areas around the tissue that is in the middle of the wound. There is no purulence I cannot feel bone. The original source was for a bone scraping. 11/8; right first metatarsal head. This does not probe to bone and there is no purulence. As far as I can tell she is completed the Septra although there were problems with exact length of time and I think compliance. In any case I gave her 6 weeks. I have reviewed her PAD above. 11/15; right first metatarsal head. Again protruding subcutaneous tissue but this does not have any adherence to surrounding skin. Undermining circumferentially. There is no palpable bone. Not grossly purulent. We have been using Iodoflex to try to get some adherence to clean off the surface but no real improvement. I Karla not think they actually had enough supplies listening to the patient. She completed the 6 weeks of Septra I gave her, but I am not sure about the compliance with the dates i.e. may have missed days taken 1 a day etc. The patient has a vascular follow-up with Dr. Gwenlyn Found this coming Friday i.e. in 3 days. By my read of arterial evaluations I Karla not think she is felt to be a candidate for any further revascularization. The last angiogram was in July. 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 She continues to smoke now up to 10 cigarettes a day. She is not in any pain. Wound has not improved but is certainly not worsened. I gave her 6 weeks of trimethoprim/sulfamethoxazole which she is completed in some fashion. 11/29 right first metatarsal head previous TMA. Wound actually looks somewhat better today still a lot of callus around the wound on debris on the wound surface. UNFORTUNATELY the patient missed her appointment with Dr. Gwenlyn Found and is not rebooked apparently until sometime in February Electronic Signature(s) Signed: 10/12/2021 5:06:47 PM By: Linton Ham MD Entered By: Linton Ham on 10/12/2021 16:38:56 -------------------------------------------------------------------------------- Physical Exam Details Patient Name: Date of Service: HA LEY, Karla LLY  S. 10/12/2021 2:30 PM Medical Record Number: [id number] Patient Account Number: [id number] Date of Birth/Sex: Treating RN: 1961-10-19 (60 y.o. [name] Primary Care Provider: Charlott Rakes Other Clinician: Referring Provider: Treating Provider/Extender: Sindy Guadeloupe Weeks in Treatment: 69 Constitutional Patient is hypertensive.. Pulse regular and within target range for patient.[name] Respirations regular, non-labored and within target range.. Temperature is normal and within the target range for the patient.[name] Appears in no distress. Notes Wound exam; right first metatarsal head. Callus around the wound and debris on the wound surface I removed all of this with a #3 curette in spite of her blood flow limitations it is possible to get this to look fairly good. I removed callus skin and some mild subcutaneous tissue on the wound surface Electronic Signature(s) Signed: 10/12/2021 5:06:47 PM By: Linton Ham MD Entered By: Linton Ham on 10/12/2021 16:37:46 -------------------------------------------------------------------------------- Physician Orders Details Patient Name: Date of Service: [name], Karla [name] 10/12/2021 2:30 PM Medical Record Number: [id number] Patient Account Number: [id number] Date of Birth/Sex: Treating RN: 14-Jun-1961 (59 y.o. [name], [name] Primary Care Provider: Charlott Rakes Other Clinician: Referring Provider: Treating Provider/Extender: Sindy Guadeloupe Weeks in Treatment: 3 Verbal / Phone Orders: No Diagnosis Coding Follow-up Appointments ppointment in 2 weeks. - Dr. Dellia Nims Return A Nurse Visit: - 1 week for rewrap Bathing/ Shower/ Hygiene May shower with protection but Karla not get wound  dressing(s) wet. Edema Control - Lymphedema / SCD / Other Elevate legs to the level of the heart or above for 30 minutes daily and/or when sitting, a frequency of: - throughout the day Avoid standing for long periods of time. Patient to wear own compression stockings every day. - left leg Exercise regularly Off-Loading Open toe surgical shoe to: - with felt padding to offload Additional Orders / Instructions Stop/Decrease Smoking Follow Nutritious Diet Wound Treatment Wound #16 - Amputation Site - Transmetatarsal Wound Laterality: Plantar, Right Cleanser: Soap and Water 1 x Per Week/7 Days Discharge Instructions: May shower and wash wound with dial antibacterial soap and water prior to dressing change. Cleanser: Wound Cleanser 1 x Per Week/7 Days Discharge Instructions: Cleanse the wound with wound cleanser or normal saline prior to applying a clean dressing using gauze sponges, not tissue or cotton balls. Peri-Wound Care: Sween Lotion (Moisturizing lotion) (Home Health) 1 x Per Week/7 Days Discharge Instructions: Apply moisturizing lotion as directed Prim Dressing: IODOFLEX 0.9% Cadexomer Iodine Pad 4x6 cm 1 x Per Week/7 Days ary Discharge Instructions: Apply to wound bed as instructed Secondary Dressing: Woven Gauze Sponge, Non-Sterile 4x4 in 1 x Per Week/7 Days Discharge Instructions: Apply over primary dressing as directed. Secondary Dressing: ABD Pad, 5x9 1 x Per Week/7 Days Discharge Instructions: Apply over primary dressing as directed. Secondary Dressing: Optifoam Non-Adhesive Dressing, 4x4 in 1 x Per Week/7 Days Discharge Instructions: Foam donut to help offload Compression Wrap: Kerlix Roll 4.5x3.1 (in/yd) 1 x Per Week/7 Days Discharge Instructions: Apply Kerlix and Coban compression as directed. Compression Wrap: Coban Self-Adherent Wrap 4x5 (in/yd) 1 x Per Week/7 Days Discharge Instructions: Apply over Kerlix as directed. Electronic Signature(s) Signed: 10/12/2021  5:06:47 PM By: Linton Ham MD Signed: 10/12/2021 5:11:56 PM By: [name] RN Entered By: [name] on 10/12/2021 15:53:51 -------------------------------------------------------------------------------- Problem List Details Patient Name: Date of Service: [name], Karla [name] 10/12/2021 2:30 PM Medical Record Number: [id number] Patient Account Number: [id number] Date of Birth/Sex: Treating RN: [birth year] (60 y.o. 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, right ankle and foot 08/30/2021 No Yes E11.51 Type 2 diabetes mellitus with diabetic peripheral angiopathy without gangrene 02/19/2021 No Yes I87.2 Venous insufficiency (chronic) (peripheral) 06/18/2021 No Yes Inactive Problems ICD-10 Code Description Active Date Inactive Date L97.811 Non-pressure chronic ulcer of other part of right lower leg limited to breakdown of skin 02/19/2021 02/19/2021 S81.802A Unspecified open wound, left lower leg, initial encounter 06/18/2021 06/18/2021 S40.812D Abrasion of left upper arm, subsequent encounter 07/20/2021 07/20/2021 Resolved Problems ICD-10 Code Description Active Date Resolved Date S40.811D Abrasion of right upper arm, subsequent encounter 03/29/2021 03/29/2021 Electronic Signature(s) Signed: 10/12/2021 5:06:47 PM By: Linton Ham MD Entered By: Linton Ham on 10/12/2021 16:35:20 -------------------------------------------------------------------------------- Progress Note Details Patient Name: Date of Service: [name], Karla [name] 10/12/2021 2:30  PM Medical Record Number: [id number] Patient Account Number: [id number] Date of Birth/Sex: Treating RN: [birth year] (60 y.o. [name], [name] Primary Care Provider: Charlott Rakes Other Clinician: Referring Provider: Treating Provider/Extender: Sindy Guadeloupe Weeks in Treatment: 33 Subjective History of Present Illness (HPI) This 60 year old patient who has a very long significant history of diabetes mellitus, previous alcohol and nicotine abuse, chronic data disease, COPD, diabetes mellitus, height hypertension, critical lower limb ischemia with several wounds being managed at the wound Center at [facility] for over a year. She was recently in the ER at [place] [facility] center. He has had a long history of critical limb ischemia and over a period of time has had balloon angioplasties in March 2016, endarterectomy of the left carotid, lower extremity angiogram and treatment by Dr. Quay Burow, several cardiac catheterizations. Most recently she had an x-ray of her left foot while in the ER which showed no acute abnormality. during this ER visit she was started on ciprofloxacin and asked to continue with the wound care physicians at [facility]. Her last ABI done in June 2017 showed the right side to be 0.28 in the left side to be 0.48. Her right toe brachial index was 0.17 on the right and 0.27 on the left. her last hemoglobin A1c was 11.1% She continues to smoke about a pack of cigarettes a day. 03/28/2017 -- -- right foot x-ray -- IMPRESSION:Areas of soft tissue swelling. Mild subluxation second PIP joint. No frank dislocation or fracture. No erosive change or bony destruction. No soft tissue ulceration or radiopaque foreign body evident by radiography. There is plantar fascia calcification with a nearby inferior calcaneal spur. There are foci of arterial vascular calcification. 04/04/2017 -- the patient continues to be noncompliant and  continues to complain of a lot of pain and has not done anything about quitting smoking Readmission: 06/26/18 on evaluation today patient presents for reevaluation she has not been seen in our office since May 2018. Since she was last seen here in our office she has undergone testing at Dr. Fransico Ancel Easler office where it was revealed that she had an ABI of 0.68 with her previous ABI being 0.64 and a left ABI of 0.38 with previous ABI being 0.34 this study which was performed on 04/05/18 was pretty much equivalent to the study performed on 11/22/17. The findings in the end revealed on the right would appear to  be moderate right lower extremity arterial disease on the left Dr. Gwenlyn Found states moderate in the report but unfortunately this appears to be much more severe at 0.38 compared to the right. Subsequently the patient was scheduled to have angiography with Dr. Gwenlyn Found on 04/12/18. This was however canceled due to the fact that the patient was found to have chronic kidney disease stage III and it was to the point that he did not feel that it will be safe to pursue angiography at that point. She has not been on any antibiotics recently. At this point Dr. Gwenlyn Found has not rescheduled anything as far as the injured [place] according to the patient he is somewhat reluctant to [name]. Nonetheless with her diminished blood flow this is gonna make it somewhat difficult for her to heal. 07/05/18; this is a patient I have not seen previously. She has very significant PAD as noted above. She apparently has had revascularization efforts by Dr. Gwenlyn Found on the right on 3 occasions to the patient. She was supposed to have an attempted angiography on the left however this was canceled apparently because of stage III chronic renal failure. I will need to research all of this. She complains of significant pain in the wound and has claudication enough that when she walks to the end of the driveway she has to stop. She is been using  silver alginate to the wounds on her legs and Iodoflex to the area on her left second toe. 07/13/18 on evaluation today patient's wounds actually appear to be doing about the same. She has an appointment she tells me within the next month that is September 2019 with Dr. Gwenlyn Found to discuss options to see if there's anything else that you can recommend or Karla for her. Nonetheless obviously what we're trying to Karla is Karla what we can to save her leg and in turn prevent any additional worsening or damage. None in the meantime we been mainly trying to manage her ulcers as best we can. 07/27/18; some improvement in the multiplicity of wounds on her left lower extremity and foot. She's been using silver alginate. I was unable to determine that she actually has an appointment with Dr. Gwenlyn Found. We are checking into this The patient's wound includes Left lateral foot, left plantar heel, her left anterior calf wound looks close to me, left fourth toe is very close to closed and the left medial calf is perhaps the largest open area READMISSION 02/19/2021 This is a now 60 year old woman with type 2 diabetes and continued cigarette smoking. She has known PAD. She was last in this clinic in 2019 at that point with wounds on her left foot. She left in a nonhealed state. On 06/28/2019 I see she had a left femoropopliteal by vein and vascular with an amputation of the fifth ray. These managed to heal over. Her last arterial studies were in February 2021. This showed an absent waveform at the posterior tibial artery on the right a dampened monophasic dorsalis pedis on the right of 0.44. On the left again the PTA TA was absent her dorsalis pedis was 0.99 triphasic and her great toe pressure was 0.65. This would have been after her revascularization. Once again she tells me that Dr. Gwenlyn Found has done revascularization on the right leg on 3 different occasions. She has a right transmetatarsal amputation apparently done by Dr. Sharol Given  remotely but I Karla not see a note for these right lower extremity revascularization but I may not of looked  back far enough. In any case she says that the she has a wound on the plantar aspect of the right transmetatarsal amputation site there is been there for several months. More recently she fell and had a wound on her right medial lower leg she had 5 sutures placed in the ER and then subsequently she has developed an area on the medial lower leg which was a blister that opened up. Past medical history includes type 2 diabetes, PAD, chronic renal failure, congestive heart failure, right transmetatarsal amputation, left fifth ray amputation, continued tobacco abuse, atrial fibrillation and cirrhosis. We did not attempt an ABI on the right leg today because of pain 02/26/2021; patient we admitted to the clinic last week. She has what looks to be 2 areas on her right medial and right anterior lower leg that look more like venous wounds but she has 1 on the first met head at the base of her right transmet. She has known severe PAD. She complains of a lot of pain although some of this may be neuropathic. Is difficult to exclude a component of claudication. Use silver alginate on the wounds on her legs and Iodoflex on the area on the first met head. She has an appointment with Dr. Gwenlyn Found on 5/25 although I will text him and discussed the situation. She is have poorly controlled diabetic. She has has stage IIIb chronic renal failure 4/22; patient presents for 1 week follow-up. The 2 areas on her right medial and right anterior lower leg appear well-healing. She has been using silver alginate to this area without issues. She has a first met head ulcer and it is unclear how long this has been there as it was discovered in the ED earlier this month when she was being evaluated for another issue. Iodoflex has been used at this area. 4/29; patient presents for 1 week follow-up. She has been using silver  alginate to the leg wounds and Iodoflex to the plantar foot wound. She has had this wrapped with Coban and Kerlix. She has no concerns or complaints today. 5/6; this is a difficult wound on the right plantar foot transmit site. We are not making a lot of progress. She had 2 more venous looking wounds on the right medial and right anterior lower leg 1 of which is healed. Her appointment with Dr. Gwenlyn Found is on 5/25 5/16; she did not tolerate the offloading shoe we gave her in fact she had a fall with an abrasion on her right forearm she is now back in the modified small shoe which does not offload her foot properly. Her appoint with Dr. Gwenlyn Found is still on 5/25 we have been using Iodoflex. The area on the right anterior lower leg is healed 7/18; patient presents for follow-up however has not been seen in 2 months. She was last seen at the end of May. She had a fall at the end of June and was hospitalized. She has been unable to follow-up since then. For the wound she has only been keeping it covered with gauze. She is scheduled for an aortogram with lower extremity runoff at the end of July. 7/29; patient presents for 2-week follow-up. She has home health and they have been changing her dressings. She denies any issues and has no complaints today. She states she had a procedure where they opened up one of her vessels in her legs. She denies signs of infection. 8/5; patient presents for follow-up. She was recently in the hospital for bradycardia.  She was noted to have cellulitis to the left lower extremity and Unna boot was placed due to blisters and increased swelling. She reports improvement to her left leg since being in the hospital and stability check her right plantar foot wound. She denies signs of infection. 8/23; since I last saw this patient a lot has happened. She still has the area over the right first met head in the setting of previous transmetatarsal amputation. Firstly most importantly she  underwent revascularization of her occluded right SFA. She underwent directional atherectomy followed by drug-coated balloon angioplasty. She has no named vessel below the knee hopefully the revascularization of the right SFA will improve collateral flow. It is not felt however that she has any endovascular options. She had follow-up arterial studies noninvasive on 8/9. These showed an ABI of 0.44 at the right PTA. Monophasic waveforms. On the left her great toe ABI was 0.68. Her follow-up arterial Doppler showed a 50 to 74% stenosis in the proximal SFA and a 50 to 74% stenosis in the distal SFA mild to severe atherosclerosis noted throughout the extremity. Areas of shadowing plaque seen, unable to rule out higher grade stenosis she also had a fall apparently wearing a right foot forefoot off loader that we gave her. She therefore comes in in an ordinary running shoe today. Not certain if this is the fall that ended up in the hospital with bradycardia 9/6 area over the right first metatarsal head in the setting of her previous amputation and severe PAD. She is also a diabetic with known PAD status post attempt at revascularization. She is continued smoker Again the separation of visits in the clinic is somewhat disturbing if we are going to consider her for a total contact cast. She is not wearing anything to offload this stating it causes imbalance especially the forefoot off loader. She basically comes in in bedroom slippers She also had a fall this morning about an hour ago. She has a skin tear on the left dorsal forearm 9/13; areas over the right first metatarsal head in the setting of previous TMA and severe PAD. Again she comes in here in slippers. We have been using Hydrofera Blue. We use MolecuLight on this which was essentially negative study 9/20; right first metatarsal head in the setting of her previous TMA and severe PAD. Same slipper type shoes. She says she cannot wear a forefoot off  loader and for some reason she will not wear surgical shoes. She says she is smoking half a pack per day. 9/30; right first metatarsal head. Absolutely no improvement in fact there is tunneling superiorly very close to bone. She does not offload this properly at all. We use MolecuLight on this 2 weeks ago that did not show any surface bacteria we have been using silver collagen is a dressing She tells me she is down to 3 cigarettes a day I have offered encouragement and a treatment plan 10/6; right first metatarsal head. Exposed bone this week which is not surprising. We have been using silver collagen. She is smoking 5 to 10 cigarettes a day 10/17; she has not been here in 10 days. The bone scraping that I did on 10/6 showed staph lugdunensis, staph epidermidis and Pseudomonas Alcalifenes. I put in for Septra DS 1 p.o. twice daily for 14 days last week would be but we could not reach her to start the antibiotic. Quinolones would have been a good alternative except she has multiple drug interactions. Septra would not cover  what ever the Pseudomonas is but I am not really sure of the relevance here. I am going to try her on the Septra for 2 weeks. She has a probing wound on the right TMA that probes to bone. She claims to have just about stop smoking I reviewed her arterial status. She underwent a left fem-tib bypass by Dr. Donnetta Hutching in 06/28/2019. Her last angiogram was in July of this year by Dr. Gwenlyn Found. This showed an occluded right SFA the popliteal and tibial vessels were also occluded the peroneal vessel filled by collaterals and filled the PT and DP by communicating collaterals. She was not felt to have any additional and vascular options. Dr. Gwenlyn Found noted that she he had previously revascularized her right SFA 3 times. 10/25; she is tolerating the Septra but says it makes her nauseated. I am going to continue this for an additional 2 weeks perhaps for a total of 6 weeks. Her wound does not look too  much different. There is on the right first met metatarsal head. There is probing areas around the tissue that is in the middle of the wound. There is no purulence I cannot feel bone. The original source was for a bone scraping. 11/8; right first metatarsal head. This does not probe to bone and there is no purulence. As far as I can tell she is completed the Septra although there were problems with exact length of time and I think compliance. In any case I gave her 6 weeks. I have reviewed her PAD above. 11/15; right first metatarsal head. Again protruding subcutaneous tissue but this does not have any adherence to surrounding skin. Undermining circumferentially. There is no palpable bone. Not grossly purulent. We have been using Iodoflex to try to get some adherence to clean off the surface but no real improvement. I Karla not think they actually had enough supplies listening to the patient. She completed the 6 weeks of Septra I gave her, but I am not sure about the compliance with the dates i.e. may have missed days taken 1 a day etc. The patient has a vascular follow-up with Dr. Gwenlyn Found this coming Friday i.e. in 3 days. By my read of arterial evaluations I Karla not think she is felt to be a candidate for any further revascularization. The last angiogram was in July. 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 She continues to smoke now up to 10 cigarettes a day. She is not in any pain. Wound has not improved but is certainly not worsened. I gave her 6 weeks of trimethoprim/sulfamethoxazole which she is completed in some fashion. 11/29 right first metatarsal head previous TMA. Wound actually looks somewhat better today still a lot of callus around the wound on debris on the wound surface. UNFORTUNATELY the patient missed her appointment with Dr. Gwenlyn Found and is not rebooked apparently until sometime in February Objective Constitutional Patient is hypertensive.. Pulse regular and within  target range for patient.[name] Respirations regular, non-labored and within target range.. Temperature is normal and within the target range for the patient.[name] Appears in no distress. Vitals Time Taken: 3:18 PM, Height: 64 in, Weight: 222 lbs, BMI: 38.1, Temperature: 98.6 F, Pulse: 86 bpm, Respiratory Rate: 16 breaths/min, Blood Pressure: 162/79 mmHg. General Notes: Wound exam; right first metatarsal head. Callus around the wound and debris on the wound surface I removed all of this with a #3 curette in spite of her blood flow limitations it is possible to get this to look fairly  good. I removed callus skin and some mild subcutaneous tissue on the wound surface Integumentary (Hair, Skin) Wound #16 status is Open. Original cause of wound was Gradually Appeared. The date acquired was: 02/05/2021. The wound has been in treatment 33 weeks. The wound is located on the Right,Plantar Amputation Site - Transmetatarsal. The wound measures 0.9cm length x 0.8cm width x 0.4cm depth; 0.565cm^2 area and 0.226cm^3 volume. There is Fat Layer (Subcutaneous Tissue) exposed. There is a medium amount of serosanguineous drainage noted. The wound margin is thickened. There is large (67-100%) pink, pale granulation within the wound bed. There is a small (1-33%) amount of necrotic tissue within the wound bed including Adherent Slough. Assessment Active Problems ICD-10 Non-pressure chronic ulcer of other part of right foot with other specified severity Type 2 diabetes mellitus with foot ulcer Other chronic osteomyelitis, right ankle and foot Type 2 diabetes mellitus with diabetic peripheral angiopathy without gangrene Venous insufficiency (chronic) (peripheral) Procedures Wound #16 Pre-procedure diagnosis of Wound #16 is a Diabetic Wound/Ulcer of the Lower Extremity located on the Right,Plantar Amputation Site - Transmetatarsal .Severity of Tissue Pre Debridement is: Fat layer exposed. There was a Excisional  Skin/Subcutaneous Tissue Debridement with a total area of 0.72 sq cm performed by Ricard Dillon., MD. With the following instrument(s): Curette to remove Viable and Non-Viable tissue/material. Material removed includes Subcutaneous Tissue, Slough, Skin: Dermis, and Skin: Epidermis after achieving pain control using Lidocaine. No specimens were taken. A time out was conducted at 15:50, prior to the start of the procedure. A Minimum amount of bleeding was controlled with Pressure. The procedure was tolerated well with a pain level of 0 throughout and a pain level of 0 following the procedure. Post Debridement Measurements: 0.9cm length x 0.8cm width x 0.4cm depth; 0.226cm^3 volume. Character of Wound/Ulcer Post Debridement is improved. Severity of Tissue Post Debridement is: Fat layer exposed. Post procedure Diagnosis Wound #16: Same as Pre-Procedure Plan Follow-up Appointments: Return Appointment in 2 weeks. - Dr. Dellia Nims Nurse Visit: - 1 week for rewrap Bathing/ Shower/ Hygiene: May shower with protection but Karla not get wound dressing(s) wet. Edema Control - Lymphedema / SCD / Other: Elevate legs to the level of the heart or above for 30 minutes daily and/or when sitting, a frequency of: - throughout the day Avoid standing for long periods of time. Patient to wear own compression stockings every day. - left leg Exercise regularly Off-Loading: Open toe surgical shoe to: - with felt padding to offload Additional Orders / Instructions: Stop/Decrease Smoking Follow Nutritious Diet WOUND #16: - Amputation Site - Transmetatarsal Wound Laterality: Plantar, Right Cleanser: Soap and Water 1 x Per Week/7 Days Discharge Instructions: May shower and wash wound with dial antibacterial soap and water prior to dressing change. Cleanser: Wound Cleanser 1 x Per Week/7 Days Discharge Instructions: Cleanse the wound with wound cleanser or normal saline prior to applying a clean dressing using gauze  sponges, not tissue or cotton balls. Peri-Wound Care: Sween Lotion (Moisturizing lotion) (Home Health) 1 x Per Week/7 Days Discharge Instructions: Apply moisturizing lotion as directed Prim Dressing: IODOFLEX 0.9% Cadexomer Iodine Pad 4x6 cm 1 x Per Week/7 Days ary Discharge Instructions: Apply to wound bed as instructed Secondary Dressing: Woven Gauze Sponge, Non-Sterile 4x4 in 1 x Per Week/7 Days Discharge Instructions: Apply over primary dressing as directed. Secondary Dressing: ABD Pad, 5x9 1 x Per Week/7 Days Discharge Instructions: Apply over primary dressing as directed. Secondary Dressing: Optifoam Non-Adhesive Dressing, 4x4 in 1 x Per Week/7 Days  Discharge Instructions: Foam donut to help offload Compression Wrap: Kerlix Roll 4.5x3.1 (in/yd) 1 x Per Week/7 Days Discharge Instructions: Apply Kerlix and Coban compression as directed. Compression Wrap: Coban Self-Adherent Wrap 4x5 (in/yd) 1 x Per Week/7 Days Discharge Instructions: Apply over Kerlix as directed. 1. The wound actually looks quite better and for this reason I am continuing the Iodoflex 2. We are not really offloading this that aggressively. Only a surgical sandal. She did not tolerate a forefoot offloading shoe 3. I again talked to her about smoking cessation. I gave her what I like to tell patients when I was doing primary care. Unfortunately she has a sister and another person in her home who also smokes Engineer, maintenance) Signed: 10/12/2021 5:06:47 PM By: Linton Ham MD Entered By: Linton Ham on 10/12/2021 16:40:12 -------------------------------------------------------------------------------- SuperBill Details Patient Name: Date of Service: [name], Karla [name] 10/12/2021 Medical Record Number: [id number] Patient Account Number: [id number] Date of Birth/Sex: Treating RN: 09-May-1961 (60 y.o. [name], [name] Primary Care Provider: Charlott Rakes Other Clinician: Referring Provider: Treating  Provider/Extender: Sindy Guadeloupe Weeks in Treatment: 33 Diagnosis Coding ICD-10 Codes Code Description L97.518 Non-pressure chronic ulcer of other part of right foot with other specified severity 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 2 diabetes mellitus with foot ulcer M86.671 Other chronic osteomyelitis, right ankle and foot E11.51 Type 2 diabetes mellitus with diabetic peripheral angiopathy without gangrene I87.2 Venous insufficiency (chronic) (peripheral) Facility Procedures CPT4 Code: [id number] Description: 22411 - DEB SUBQ TISSUE 20 SQ CM/< ICD-10 Diagnosis Description L97.518 Non-pressure chronic ulcer of other part of right foot with other specified sev Modifier: erity Quantity: 1 Physician Procedures : CPT4 Code Description Modifier [id number] - WC PHYS SUBQ TISS 20 SQ CM ICD-10 Diagnosis Description L97.518 Non-pressure chronic ulcer of other part of right foot with other specified severity Quantity: 1 Electronic Signature(s) Signed: 10/12/2021 5:06:47 PM By: Linton Ham MD Entered By: Linton Ham on 10/12/2021 16:40:26

## 2021-10-12 NOTE — Telephone Encounter (Signed)
I have refilled atorvastatin and pantoprazole.  Nortriptyline prescription was written on 11/2 and she should not require an additional prescription as the last 2 weeks she should have been taking 10 mg twice a week which should end on 10/14/2021.

## 2021-10-13 NOTE — Progress Notes (Addendum)
**Note Karla-Identified via Obfuscation** COSIMA, PRENTISS ([id number]) Visit Report for 10/12/2021 Arrival Information Details Patient Name: Date of Service: [name] 10/12/2021 2:30 PM Medical Record Number: [id number] Patient Account Number: [id number] Date of Birth/Sex: Treating RN: [birth year] (60 y.o. Tonita Phoenix, Lauren Primary Care Jameeka Marcy: Charlott Rakes Other Clinician: Referring Reeshemah Nazaryan: Treating Kymora Sciara/Extender: Ramon Dredge in Treatment: 33 Visit Information History Since Last Visit Pain Present Now: Yes Patient Arrived: Walker Arrival Time: 15:20 Accompanied By: none Transfer Assistance: None Patient Identification Verified: Yes Secondary Verification Process Completed: Yes Patient Requires Transmission-Based Precautions: No Patient Has Alerts: No Electronic Signature(s) Signed: 10/12/2021 3:22:18 PM By: Valeria Batman EMT Entered By: Valeria Batman on 10/12/2021 15:22:18 -------------------------------------------------------------------------------- Encounter Discharge Information Details Patient Name: Date of Service: [name], [name] 10/12/2021 2:30 PM Medical Record Number: [id number] Patient Account Number: [id number] Date of Birth/Sex: Treating RN: 12-06-1960 (60 y.o. Tonita Phoenix, Lauren Primary Care Tomeeka Plaugher: Charlott Rakes Other Clinician: Referring Jennifermarie Franzen: Treating Katera Rybka/Extender: Sindy Guadeloupe Weeks in Treatment: 29 Encounter Discharge Information Items Post Procedure Vitals Discharge Condition: Stable Temperature (F): 97.4 Ambulatory Status: Wheelchair Pulse (bpm): 74 Discharge Destination: Home Respiratory Rate (breaths/min): 17 Transportation: Private Auto Blood Pressure (mmHg): 134/73 Accompanied By: self Schedule Follow-up Appointment: Yes Clinical Summary of Care: Patient Declined Electronic Signature(s) Signed: 10/12/2021 5:11:56 PM By: [name] RN Entered By: [name] on 10/12/2021  16:01:47 -------------------------------------------------------------------------------- Lower Extremity Assessment Details Patient Name: Date of Service: [name], [name] 10/12/2021 2:30 PM Medical Record Number: [id number] Patient Account Number: [id number] Date of Birth/Sex: Treating RN: 1961/01/09 (60 y.o. Tonita Phoenix, Lauren Primary Care Shadoe Cryan: Charlott Rakes Other Clinician: Referring Ebubechukwu Jedlicka: Treating Bhumi Godbey/Extender: Sindy [place] Weeks in Treatment: 33 Edema Assessment Assessed: [Left: No] [Right: No] Edema: [Left: Ye] [Right: s] Calf Left: Right: Point of Measurement: From Medial Instep 37 cm Ankle Left: Right: Point of Measurement: From Medial Instep 21 cm Electronic Signature(s) Signed: 10/12/2021 5:02:07 PM By: Valeria Batman EMT Signed: 10/12/2021 5:11:56 PM By: [name] RN Entered By: Valeria Batman on 10/12/2021 15:26:48 -------------------------------------------------------------------------------- Multi Wound Chart Details Patient Name: Date of Service: [name], [name] 10/12/2021 2:30 PM Medical Record Number: [id number] Patient Account Number: [id number] Date of Birth/Sex: Treating RN: 11/15/1960 (60 y.o. Tonita Phoenix, Lauren Primary Care Kynesha Guerin: Charlott Rakes Other Clinician: Referring Kris Burd: Treating Zakyla Tonche/Extender: Sindy Gu[place] Weeks in Treatment: 33 Vital Signs Height(in): 64 Pulse(bpm): 23 Weight(lbs): 222 Blood Pressure(mmHg): 162/79 Body Mass Index(BMI): 38 Temperature(F): 98.6 Respiratory Rate(breaths/min): 16 Photos: [N/A:N/A] Right, Plantar Amputation Site - N/A N/A Wound Location: Transmetatarsal Gradually Appeared N/A N/A Wounding Event: Diabetic Wound/Ulcer of the Lower N/A N/A Primary Etiology: Extremity Chronic Obstructive Pulmonary N/A N/A Comorbid History: Disease (COPD), Sleep Apnea, Arrhythmia, Congestive Heart Failure, Coronary Artery  Disease, Hypertension, Peripheral Arterial Disease, Cirrhosis , Type II Diabetes, Osteoarthritis, Neuropathy 02/05/2021 N/A N/A Date Acquired: 7 N/A N/A Weeks of Treatment: Open N/A N/A Wound Status: 0.9x0.8x0.4 N/A N/A Measurements L x W x D (cm) 0.565 N/A N/A A (cm) : rea 0.226 N/A N/A Volume (cm) : 65.40% N/A N/A % Reduction in A rea: 53.90% N/A N/A % Reduction in Volume: Grade 2 N/A N/A Classification: Medium N/A N/A Exudate A mount: Serosanguineous N/A N/A Exudate Type: red, brown N/A N/A Exudate Color: Thickened N/A N/A Wound Margin: Large (67-100%) N/A N/A Granulation A mount: Pink, Pale N/A N/A Granulation Quality: Small (1-33%) N/A N/A Necrotic A mount: Fat Layer (Subcutaneous Tissue): Yes N/A N/A Exposed Structures: Fascia: No  Tendon: No Muscle: No Joint: No Bone: No None N/A N/A Epithelialization: Debridement - Excisional N/A N/A Debridement: Pre-procedure Verification/Time Out 15:50 N/A N/A Taken: Lidocaine N/A N/A Pain Control: Subcutaneous, Slough N/A N/A Tissue Debrided: Skin/Subcutaneous Tissue N/A N/A Level: 0.72 N/A N/A Debridement A (sq cm): rea Curette N/A N/A Instrument: Minimum N/A N/A Bleeding: Pressure N/A N/A Hemostasis A chieved: 0 N/A N/A Procedural Pain: 0 N/A N/A Post Procedural Pain: Procedure was tolerated well N/A N/A Debridement Treatment Response: 0.9x0.8x0.4 N/A N/A Post Debridement Measurements L x W x D (cm) 0.226 N/A N/A Post Debridement Volume: (cm) Debridement N/A N/A Procedures Performed: Treatment Notes 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 Wound Cleanser Discharge Instruction: Cleanse the wound with wound cleanser or normal saline prior to applying a clean dressing using gauze sponges, not tissue or cotton  balls. Peri-Wound Care Sween Lotion (Moisturizing lotion) Discharge Instruction: Apply moisturizing lotion as directed Topical Primary Dressing IODOFLEX 0.9% Cadexomer Iodine Pad 4x6 cm Discharge Instruction: Apply to wound bed as instructed Secondary Dressing Woven Gauze Sponge, Non-Sterile 4x4 in Discharge Instruction: Apply over primary dressing as directed. ABD Pad, 5x9 Discharge Instruction: Apply over primary dressing as directed. Optifoam Non-Adhesive Dressing, 4x4 in Discharge Instruction: Foam donut to help offload Secured With Compression Wrap Kerlix Roll 4.5x3.1 (in/yd) Discharge Instruction: Apply Kerlix and Coban compression as directed. Coban Self-Adherent Wrap 4x5 (in/yd) Discharge Instruction: Apply over Kerlix as directed. Compression Stockings Add-Ons Electronic Signature(s) Signed: 10/12/2021 5:06:47 PM By: Linton Ham MD Signed: 10/12/2021 5:11:56 PM By: [name] RN Entered By: Linton Ham on 10/12/2021 16:35:35 -------------------------------------------------------------------------------- Multi-Disciplinary Care Plan Details Patient Name: Date of Service: [name], [name] 10/12/2021 2:30 PM Medical Record Number: [id number] Patient Account Number: [id number] Date of Birth/Sex: Treating RN: 1961/10/14 (60 y.o. Tonita Phoenix, Lauren Primary Care Altie Savard: Charlott Rakes Other Clinician: Referring Kharlie Bring: Treating Everest Brod/Extender: Ramon Dredge in Treatment: [place] reviewed with physician Active Inactive Wound/Skin Impairment Nursing Diagnoses: Impaired tissue integrity Goals: Patient/caregiver will verbalize understanding of skin care regimen Date Initiated: 02/19/2021 Target Resolution Date: 10/16/2021 Goal Status: Active Ulcer/skin breakdown will have a volume reduction of 30% by week 4 Date Initiated: 02/19/2021 Date Inactivated: 03/29/2021 Target Resolution Date:  04/09/2021 Goal Status: Unmet Unmet Reason: PAD Interventions: Assess patient/caregiver ability to obtain necessary supplies Assess patient/caregiver ability to perform ulcer/skin care regimen upon admission and as needed Assess ulceration(s) every visit Provide education on ulcer and skin care Treatment Activities: Skin care regimen initiated : 02/19/2021 Topical wound management initiated : 02/19/2021 Notes: Electronic Signature(s) Signed: 10/12/2021 5:11:56 PM By: [name] RN Entered By: [name] on 10/12/2021 15:55:02 -------------------------------------------------------------------------------- Pain Assessment Details Patient Name: Date of Service: [name], [name] 10/12/2021 2:30 PM Medical Record Number: [id number] Patient Account Number: [id number] Date of Birth/Sex: Treating RN: September 14, 1961 (60 y.o. Tonita Phoenix, Lauren Primary Care Loyd Salvador: Charlott Rakes Other Clinician: Referring Pennie Vanblarcom: Treating Vertie Dibbern/Extender: Sindy Gu[place]oupe Weeks in Treatment: 33 Active Problems Location of Pain Severity and Description of Pain Patient Has Paino Yes Site Locations Pain Location: Generalized Pain With Dressing Change: No Duration of the Pain. Constant / Intermittento Constant Rate the pain. Current Pain Level: 8 Worst Pain Level: 10 Least Pain Level: 5 Tolerable Pain Level: 5 Character of Pain Describe the Pain: Cramping Pain Management and Medication Current Pain Management: Electronic Signature(s)  Signed: 10/12/2021 3:26:35 PM By: Valeria Batman EMT Signed: 10/12/2021 5:11:56 PM By: [name] RN Entered By: Valeria Batman on 10/12/2021 15:26:35 -------------------------------------------------------------------------------- Patient/Caregiver Education Details Patient Name: Date of Service: [name] 11/29/2022andnbsp2:30 PM Medical Record Number: [id number] Patient Account Number: [id number] Date of  Birth/Gender: Treating RN: 1961/09/02 (60 y.o. [name] Primary Care Physician: Charlott Rakes Other Clinician: Referring Physician: Treating Physician/Extender: Ramon Dredge in Treatment: 94 Education Assessment Education Provided To: Patient Education Topics Provided Wound/Skin Impairment: Methods: Explain/Verbal Responses: State content correctly Electronic Signature(s) Signed: 10/12/2021 5:11:56 PM By: [name] RN Entered By: [name] on 10/12/2021 15:56:41 -------------------------------------------------------------------------------- Wound Assessment Details Patient Name: Date of Service: [name], [name] 10/12/2021 2:30 PM Medical Record Number: [id number] Patient Account Number: [id number] Date of Birth/Sex: Treating RN: [birth year] (60 y.o. Tonita Phoenix, Lauren Primary Care Marcile Fuquay: Charlott Rakes Other Clinician: Referring Sarahjane Matherly: Treating Mahathi Pokorney/Extender: Sindy Guadeloupe Weeks in Treatment: 33 Wound Status Wound Number: 16 Primary Diabetic Wound/Ulcer of the Lower Extremity Etiology: Wound Location: Right, Plantar Amputation Site - Transmetatarsal Wound Open Wounding Event: Gradually Appeared Status: Date Acquired: 02/05/2021 Comorbid Chronic Obstructive Pulmonary Disease (COPD), Sleep Apnea, Weeks Of Treatment: 33 History: Arrhythmia, Congestive Heart Failure, Coronary Artery Disease, Clustered Wound: No Hypertension, Peripheral Arterial Disease, Cirrhosis , Type II Diabetes, Osteoarthritis, Neuropathy Photos Wound Measurements Length: (cm) 0.9 Width: (cm) 0.8 Depth: (cm) 0.4 Area: (cm) 0.565 Volume: (cm) 0.226 % Reduction in Area: 65.4% % Reduction in Volume: 53.9% Epithelialization: None Wound Description Classification: Grade 2 Wound Margin: Thickened Exudate Amount: Medium Exudate Type: Serosanguineous Exudate Color: red, brown Foul Odor After Cleansing:  No Slough/Fibrino Yes Wound Bed Granulation Amount: Large (67-100%) Exposed Structure Granulation Quality: Pink, Pale Fascia Exposed: No Necrotic Amount: Small (1-33%) Fat Layer (Subcutaneous Tissue) Exposed: Yes Necrotic Quality: Adherent Slough Tendon Exposed: No Muscle Exposed: No Joint Exposed: No Bone Exposed: No Treatment Notes 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 Wound Cleanser Discharge Instruction: Cleanse the wound with wound cleanser or normal saline prior to applying a clean dressing using gauze sponges, not tissue or cotton balls. Peri-Wound Care Sween Lotion (Moisturizing lotion) Discharge Instruction: Apply moisturizing lotion as directed Topical Primary Dressing IODOFLEX 0.9% Cadexomer Iodine Pad 4x6 cm Discharge Instruction: Apply to wound bed as instructed Secondary Dressing Woven Gauze Sponge, Non-Sterile 4x4 in Discharge Instruction: Apply over primary dressing as directed. ABD Pad, 5x9 Discharge Instruction: Apply over primary dressing as directed. Optifoam Non-Adhesive Dressing, 4x4 in Discharge Instruction: Foam donut to help offload Secured With Compression Wrap Kerlix Roll 4.5x3.1 (in/yd) Discharge Instruction: Apply Kerlix and Coban compression as directed. Coban Self-Adherent Wrap 4x5 (in/yd) Discharge Instruction: Apply over Kerlix as directed. Compression Stockings Add-Ons Electronic Signature(s) Signed: 10/12/2021 3:35:01 PM By: Valeria Batman EMT Signed: 10/12/2021 5:11:56 PM By: [name] RN Entered By: [name] on 10/12/2021 15:35:00 -------------------------------------------------------------------------------- Vitals Details Patient Name: Date of Service: [name], [name] 10/12/2021 2:30 PM Medical Record Number: [id number] Patient  Account Number: [id number] Date of Birth/Sex: Treating RN: 07-02-1961 (60 y.o. Tonita Phoenix, Lauren Primary Care Marylene Masek: Charlott Rakes Other Clinician: Referring Jaidin Richison: Treating Antonio Creswell/Extender: Sindy [place] Weeks in Treatment: 33 Vital Signs Time Taken: 15:18 Temperature (F): 98.6 Height (in): 64 Pulse (bpm): 86 Weight (lbs): 222 Respiratory Rate (breaths/min): 16 Body Mass Index (BMI): 38.1 Blood Pressure (mmHg):  162/79 Reference Range: 80 - 120 mg / dl Electronic Signature(s) Signed: 10/12/2021 3:23:25 PM By: Valeria Batman EMT Entered By: Valeria Batman on 10/12/2021 15:23:25

## 2021-10-14 ENCOUNTER — Telehealth: Payer: Self-pay

## 2021-10-14 ENCOUNTER — Other Ambulatory Visit (HOSPITAL_COMMUNITY): Payer: Self-pay

## 2021-10-14 ENCOUNTER — Other Ambulatory Visit (HOSPITAL_COMMUNITY): Payer: Self-pay | Admitting: Cardiology

## 2021-10-14 MED ORDER — ATORVASTATIN CALCIUM 40 MG PO TABS: 40.0000 mg | ORAL_TABLET | Freq: Every evening | ORAL | 1 refills | Status: DC

## 2021-10-14 NOTE — Telephone Encounter (Signed)
I have resent atorvastatin.  Please advise her to eat small frequent meals and she needs to stop eating as soon as she feels full to prevent his symptoms.  She can return to the previous Trulicity dose in the meantime.

## 2021-10-14 NOTE — Progress Notes (Signed)
Paramedicine Encounter    Patient ID: [name], female    DOB: [birth year], 60 y.o.   MRN: [id number]   Patient Care Team: Charlott Rakes, MD as PCP - General (Family Medicine) [name], MD as PCP - Cardiology (Cardiology) [name], MD as PCP - Electrophysiology (Cardiology) [name], MD as Consulting Physician (Cardiology) [name], MD as Consulting Physician (Pulmonary Disease) System, Provider Not In  Patient Active Problem List   Diagnosis Date Noted   Hyperlipidemia associated with type 2 diabetes mellitus ([place]) 07/29/2021   (HFpEF) heart failure with preserved ejection fraction ([place]) 07/29/2021   Bradycardia 06/13/2021   Uncontrolled type 2 diabetes mellitus with hyperglycemia ([place]) 06/13/2021   Acute kidney injury superimposed on CKD ([place]) 06/13/2021   Critical ischemia of foot ([place]) 06/07/2021   Fall 05/05/2021   PAF (paroxysmal atrial fibrillation) ([place]) 05/05/2021   COPD (chronic obstructive pulmonary disease) ([place]) 05/05/2021   DM (diabetes mellitus), type 2 with complications ([place]) 38/25/0539   PAD (peripheral artery disease) ([place]) 05/05/2021   Cellulitis 05/05/2021   Acute on chronic combined systolic and diastolic CHF (congestive heart failure) (Como) 05/05/2021   OSA (obstructive sleep apnea) 05/05/2021   CKD (chronic kidney disease) stage 3, GFR 30-59 ml/min ([place]) 05/05/2021   AKI (acute kidney injury) ([place]) 05/04/2021   Pressure injury of skin 05/04/2021   Ulcer of foot, unspecified laterality, limited to breakdown of skin ([place]) 02/17/2021   Altered mental status 01/02/2021   AF (paroxysmal atrial fibrillation) ([place]) 01/02/2021   CAD (coronary artery disease) 01/02/2021   PVD (peripheral vascular disease) ([place]) 01/02/2021   Acute renal failure superimposed on stage 4 chronic kidney disease ([place]) 01/02/2021   Diabetes mellitus type 2, uncontrolled, with complications 76/73/4193   Diabetic nephropathy ([place]) 01/02/2021   Low  back pain 06/01/2020   Hyperlipidemia 04/03/2020   Muscle weakness 03/20/2020   Closed fracture of neck of right radius 03/13/2020   Pain in elbow 03/12/2020   PAD (peripheral artery disease) ([place]) 12/26/2019   Acute renal failure ([place])    Acute respiratory failure with hypoxia ([place]) 12/02/2019   Acute exacerbation of CHF (congestive heart failure) (Ro[place]) 12/01/2019   Cellulitis in diabetic foot ([place]) 07/08/2019   Osteomyelitis ([place]) 06/21/2019   NICM (nonischemic cardiomyopathy) ([place]) 06/20/2019   Non-healing ulcer ([place]) 06/20/2019   Acute CHF (congestive heart failure) ([place]) 11/26/2018   CHF (congestive heart failure) ([place]) 11/26/2018   Acute respiratory failure ([place]) 10/21/2018   AKI (acute kidney injury) ([place]) 08/18/2018   Coagulation disorder ([place]) 08/09/2017   Depression 07/21/2017   At risk for adverse drug reaction 06/20/2017   Peripheral neuropathy 06/20/2017   Acute osteomyelitis of right foot ([place]) 06/13/2017   S/P transmetatarsal amputation of foot, right ([place]) 06/05/2017   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 ([place]) 05/19/2017   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[place])    SIRS (systemic inflammatory response syndrome) ([place]) 04/06/2017   CKD (chronic kidney disease), stage III ([place]) 11/24/2016   Suspected sleep apnea 11/24/2016   Ulcer of toe of right foot, with necrosis of bone ([place]) 10/27/2016   Ulcer of left lower leg ([place]) 05/19/2016   Severe obesity (BMI >= 40) ([place]i) 02/24/2016   COPD GOLD 0  02/24/2016   Morbid obesity (Booker) 02/24/2016   Encounter for therapeutic drug monitoring 02/10/2016   Symptomatic bradycardia 01/12/2016   Hypertension associated with diabetes ([place]) 12/22/2015   Chronic combined systolic and diastolic CHF (congestive heart failure) (  [place])    [place]    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 11/08/2015   Tobacco abuse 10/23/2015   Coronary artery disease    DOE (dyspnea on exertion) 04/29/2015    Diabetes mellitus type 2, uncontrolled 02/08/2015   Influenza A 02/07/2015   Wrist fracture, left, with routine healing, subsequent encounter 02/05/2015   Wrist fracture, left, closed, initial encounter 01/29/2015   PAF (paroxysmal atrial fibrillation) ([place]) 01/16/2015   Carotid arterial disease ([place]ville) 01/16/2015   Claudication ([place]) 01/15/2015   Demand ischemia ([place]) 10/29/2014   Insomnia 02/03/2014   COPD with acute exacerbation ([place]) 02/01/2014   S/P peripheral artery angioplasty - TurboHawk atherectomy; R SFA 09/11/2013    Class: Acute   Leg pain, bilateral 08/19/2013   Hypothyroidism 07/31/2013   Cellulitis 06/13/2013   History of cocaine abuse ([place]) 06/13/2013   Long term current use of anticoagulant therapy 05/20/2013   Alcohol abuse    Narcotic abuse ([place])    Marijuana abuse    Alcoholic cirrhosis ([name])    DM (diabetes mellitus), type 2 with peripheral vascular complications ([name])     Current Outpatient Medications:    ACCU-CHEK GUIDE test strip, USE TO CHECK BLOOD SUGAR FOUR TIMES DAILY, Disp: , Rfl:    acetaminophen (TYLENOL) 500 MG tablet, Take 500 mg by mouth every 6 (six) hours as needed for moderate pain., Disp: , Rfl:    albuterol (VENTOLIN HFA) 108 (90 Base) MCG/ACT inhaler, Inhale 2 puffs into the lungs every 6 (six) hours as needed for wheezing or shortness of breath., Disp: 18 g, Rfl: 3   allopurinol (ZYLOPRIM) 100 MG tablet, Take 1 tablet (100 mg total) by mouth daily., Disp: 30 tablet, Rfl: 6   amiodarone (PACERONE) 200 MG tablet, TAKE 1/2 TABLET (100 MG TOTAL) BY MOUTH DAILY., Disp: 15 tablet, Rfl: 3   amLODipine (NORVASC) 10 MG tablet, Take 1 tablet (10 mg total) by mouth daily., Disp: 90 tablet, Rfl: 3   apixaban (ELIQUIS) 5 MG TABS tablet, Take 5 mg by mouth 2 (two) times daily., Disp: , Rfl:    atorvastatin (LIPITOR) 40 MG tablet, Take 40 mg by mouth every evening., Disp: , Rfl:    budesonide-formoterol (SYMBICORT) 160-4.5 MCG/ACT inhaler, Inhale 2 puffs into  the lungs 2 (two) times daily., Disp: 1 Inhaler, Rfl: 2   clopidogrel (PLAVIX) 75 MG tablet, Take 1 tablet (75 mg total) by mouth daily., Disp: 30 tablet, Rfl: 11   colchicine 0.6 MG tablet, Take 2 tabs (1.2 mg) at the onset of a gout flare, may take 1 tab (0.6 mg) in 1 hour if symptoms, Disp: 30 tablet, Rfl: 3   Dulaglutide (TRULICITY) 1.5 KD/9.8PJ SOPN, Inject 1.5 mg into the skin once a week., Disp: 2 mL, Rfl: 6   empagliflozin (JARDIANCE) 10 MG TABS tablet, Take 1 tablet (10 mg total) by mouth daily before breakfast., Disp: 90 tablet, Rfl: 3   ferrous sulfate 324 MG TBEC, Take 324 mg by mouth., Disp: , Rfl:    FLUoxetine (PROZAC) 20 MG capsule, Take 1 capsule (20 mg total) by mouth at bedtime., Disp: 90 capsule, Rfl: 1   folic acid (FOLVITE) 1 MG tablet, Take 1 tablet (1 mg total) by mouth daily., Disp: 90 tablet, Rfl: 1   hydrALAZINE (APRESOLINE) 50 MG tablet, Take 1 tablet (50 mg total) by mouth 3 (three) times daily., Disp: 90 tablet, Rfl: 6   insulin glargine (LANTUS) 100 UNIT/ML Solostar Pen, Inject 30 Units into the skin daily., Disp: 30 mL, Rfl: 6  insulin lispro (HUMALOG) 100 UNIT/ML KwikPen, Inject 8 Units into the skin 3 (three) times daily with meals., Disp: 15 mL, Rfl: 0   Insulin Pen Needle 32G X 4 MM MISC, USE AS DIRECTED WITH INSULIN PENS, Disp: 100 each, Rfl: 0   levothyroxine (SYNTHROID) 50 MCG tablet, Take 1 tablet (50 mcg total) by mouth daily., Disp: 90 tablet, Rfl: 1   losartan (COZAAR) 25 MG tablet, Take 2 tablets (50 mg total) by mouth daily. (Patient taking differently: Take 50 mg by mouth at bedtime.), Disp: 180 tablet, Rfl: 1   Omega-3 Fatty Acids (FISH OIL PO), Take 1 capsule by mouth daily., Disp: , Rfl:    ondansetron (ZOFRAN) 4 MG tablet, Take 1 tablet (4 mg total) by mouth every 8 (eight) hours as needed for nausea or vomiting., Disp: 20 tablet, Rfl: 0   pantoprazole (PROTONIX) 40 MG tablet, Take 1 tablet (40 mg total) by mouth daily., Disp:  , Rfl:    torsemide  (DEMADEX) 20 MG tablet, Take 4 tablets (80 mg total) by mouth in the morning AND 3 tablets (60 mg total) every evening., Disp: 210 tablet, Rfl: 6   VITAMIN D PO, Take 1 tablet by mouth daily., Disp: , Rfl:    benzonatate (TESSALON) 100 MG capsule, Take 1 capsule (100 mg total) by mouth 2 (two) times daily as needed for cough. (Patient not taking: Reported on 10/06/2021), Disp: 40 capsule, Rfl: 0   Continuous Blood Gluc Sensor (FREESTYLE LIBRE 2 SENSOR) MISC, USE 1 (ONE) EACH EVERY 2 WEEKS (Patient not taking: Reported on 08/19/2021), Disp: 2 each, Rfl: 11   Misc. Devices MISC, 2 L oxygen continuously via nasal cannula.  Diagnosis chronic respiratory failure with hypoxia (Patient not taking: Reported on 09/02/2021), Disp: 1 each, Rfl: 0   nortriptyline (PAMELOR) 10 MG capsule, Take 1 capsule (10 mg total) by mouth every other day for 14 days, THEN 1 capsule (10 mg total) 2 (two) times a week for 14 days. Then discontinue., Disp: 11 capsule, Rfl: 0   predniSONE (DELTASONE) 20 MG tablet, Take 2 tablets (40 mg total) by mouth daily with breakfast. (Patient not taking: Reported on 08/19/2021), Disp: 8 tablet, Rfl: 0   sulfamethoxazole-trimethoprim (BACTRIM DS) 800-160 MG tablet, Take 1 tablet by mouth 2 (two) times daily. (Patient not taking: Reported on 10/06/2021), Disp: , Rfl:  Allergies  Allergen Reactions   Gabapentin Nausea And Vomiting and Other (See Comments)    POSSIBLE SHAKING   Lyrica [Pregabalin] Other (See Comments)    Shaking       Social History   Socioeconomic History   Marital status: Single    Spouse name: Not on file   Number of children: 1   Years of education: 12   Highest education level: 12th grade  Occupational History   Occupation: disabled  Tobacco Use   Smoking status: Every Day    Packs/day: 1.00    Years: 44.00    Pack years: 44.00    Types: E-cigarettes, Cigarettes   Smokeless tobacco: Former    Types: Snuff  Vaping Use   Vaping Use: Former   Devices:  11/26/2018 "stopped months ago"  Substance and Sexual Activity   Alcohol use: Yes    Comment: occ   Drug use: Yes    Types: "Crack" cocaine, Marijuana, Oxycodone   Sexual activity: Not Currently  Other Topics Concern   Not on file  Social History Narrative   ** Merged History Encounter **  Lives in [place], in [place] with sister.  They are looking to move but don't have a place to go yet.     Social Determinants of Health   Financial Resource Strain: Not on file  Food Insecurity: Not on file  Transportation Needs: Not on file  Physical Activity: Not on file  Stress: Not on file  Social Connections: Not on file  Intimate Partner Violence: Not on file    Physical Exam      Future Appointments  Date Time Provider [place]  10/19/2021  2:00 PM Ricard Dillon, MD [facility]  10/21/2021  1:40 PM Larey Dresser, MD [facility] None  10/26/2021  1:30 PM Ricard Dillon, MD [facility]  11/16/2021  3:30 PM Charlott Rakes, MD [facility] None  12/22/2021  1:00 PM [facility] 3 [facility]  01/11/2022  3:30 PM [name], MD [facility]    BP 138/62   Pulse 75   Resp 18   Wt 211 lb (95.7 kg)   LMP  (LMP Unknown)   SpO2 97%   BMI 36.22 kg/m   Weight yesterday-211 Last visit weight-212 CBG PTA-246  Pt reports she still isnt feeling well,  Still having n/v/d intermittently since the increased dose of trulicity.  She is requesting the dose be lowered back down.  I did send message to PCP office.--got message back and she can return the prior dose for now and to eat small meals through out the day.   I p/u some  meds for her at [facility] did not receive the atorv rx.  Also needs to get her thyroid pill.  Her kidney doc placed her on iron.  Also missing her pantoprazole.   Let the sister know to get those meds tomor as she needs the levo and pantoprazole and ator in pill boxes.  One pill box has atorvastatin.   Refills  needed- Folic  acid  Thyroid Atorvastatin Pantoprazole   Marylouise Stacks, [place] Paramedic  10/14/21

## 2021-10-14 NOTE — Telephone Encounter (Signed)
Message received from Marylouise Stacks, EMT:  The patient  has been c/o n/v/d since her trulicity was increased and she feels like that med is making her sick.  She was wanting to know if she can go back to the lower dose.   It does not appear that [facility] received the order for atorvastatin.  Can you please re-send?  Thanks.

## 2021-10-19 ENCOUNTER — Emergency Department (HOSPITAL_COMMUNITY)
Admission: EM | Admit: 2021-10-19 | Discharge: 2021-10-19 | Disposition: A | Discharge status: Home / Self Care | Payer: Medicare Other | Attending: Emergency Medicine | Admitting: Emergency Medicine

## 2021-10-19 ENCOUNTER — Encounter (HOSPITAL_COMMUNITY): Payer: Self-pay

## 2021-10-19 ENCOUNTER — Encounter (HOSPITAL_BASED_OUTPATIENT_CLINIC_OR_DEPARTMENT_OTHER): Payer: Medicare Other | Attending: Internal Medicine | Admitting: Internal Medicine

## 2021-10-19 ENCOUNTER — Other Ambulatory Visit: Payer: Self-pay

## 2021-10-19 ENCOUNTER — Emergency Department (HOSPITAL_COMMUNITY): Payer: Medicare Other

## 2021-10-19 DIAGNOSIS — R062 Wheezing: Secondary | ICD-10-CM | POA: Diagnosis not present

## 2021-10-19 DIAGNOSIS — I480 Paroxysmal atrial fibrillation: Secondary | ICD-10-CM | POA: Diagnosis not present

## 2021-10-19 DIAGNOSIS — L03116 Cellulitis of left lower limb: Secondary | ICD-10-CM | POA: Diagnosis not present

## 2021-10-19 DIAGNOSIS — I130 Hypertensive heart and chronic kidney disease with heart failure and stage 1 through stage 4 chronic kidney disease, or unspecified chronic kidney disease: Secondary | ICD-10-CM | POA: Insufficient documentation

## 2021-10-19 DIAGNOSIS — Z79899 Other long term (current) drug therapy: Secondary | ICD-10-CM | POA: Diagnosis not present

## 2021-10-19 DIAGNOSIS — N183 Chronic kidney disease, stage 3 unspecified: Secondary | ICD-10-CM | POA: Diagnosis not present

## 2021-10-19 DIAGNOSIS — J449 Chronic obstructive pulmonary disease, unspecified: Secondary | ICD-10-CM | POA: Diagnosis not present

## 2021-10-19 DIAGNOSIS — E039 Hypothyroidism, unspecified: Secondary | ICD-10-CM | POA: Diagnosis not present

## 2021-10-19 DIAGNOSIS — I5043 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) and diastolic (congestive) heart failure: Secondary | ICD-10-CM | POA: Insufficient documentation

## 2021-10-19 DIAGNOSIS — R519 Headache, unspecified: Secondary | ICD-10-CM | POA: Diagnosis not present

## 2021-10-19 DIAGNOSIS — I2510 Atherosclerotic heart disease of native coronary artery without angina pectoris: Secondary | ICD-10-CM | POA: Diagnosis not present

## 2021-10-19 DIAGNOSIS — F17210 Nicotine dependence, cigarettes, uncomplicated: Secondary | ICD-10-CM | POA: Diagnosis not present

## 2021-10-19 DIAGNOSIS — E1140 Type 2 diabetes mellitus with diabetic neuropathy, unspecified: Secondary | ICD-10-CM | POA: Insufficient documentation

## 2021-10-19 DIAGNOSIS — W01198A Fall on same level from slipping, tripping and stumbling with subsequent striking against other object, initial encounter: Secondary | ICD-10-CM | POA: Insufficient documentation

## 2021-10-19 DIAGNOSIS — Z794 Long term (current) use of insulin: Secondary | ICD-10-CM | POA: Insufficient documentation

## 2021-10-19 DIAGNOSIS — Z7984 Long term (current) use of oral hypoglycemic drugs: Secondary | ICD-10-CM | POA: Diagnosis not present

## 2021-10-19 DIAGNOSIS — E1122 Type 2 diabetes mellitus with diabetic chronic kidney disease: Secondary | ICD-10-CM | POA: Insufficient documentation

## 2021-10-19 DIAGNOSIS — L02416 Cutaneous abscess of left lower limb: Secondary | ICD-10-CM | POA: Insufficient documentation

## 2021-10-19 DIAGNOSIS — Z7901 Long term (current) use of anticoagulants: Secondary | ICD-10-CM | POA: Diagnosis not present

## 2021-10-19 DIAGNOSIS — M79605 Pain in left leg: Secondary | ICD-10-CM | POA: Diagnosis present

## 2021-10-19 DIAGNOSIS — L0291 Cutaneous abscess, unspecified: Secondary | ICD-10-CM

## 2021-10-19 LAB — CBC WITH DIFFERENTIAL/PLATELET
Abs Immature Granulocytes: 0.04 10*3/uL (ref 0.00–0.07)
Basophils Absolute: 0 10*3/uL (ref 0.0–0.1)
Basophils Relative: 0 %
Eosinophils Absolute: 0.3 10*3/uL (ref 0.0–0.5)
Eosinophils Relative: 3 %
HCT: 35.9 % — ABNORMAL LOW (ref 36.0–46.0)
Hemoglobin: 10.6 g/dL — ABNORMAL LOW (ref 12.0–15.0)
Immature Granulocytes: 0 %
Lymphocytes Relative: 18 %
Lymphs Abs: 1.9 10*3/uL (ref 0.7–4.0)
MCH: 25.9 pg — ABNORMAL LOW (ref 26.0–34.0)
MCHC: 29.5 g/dL — ABNORMAL LOW (ref 30.0–36.0)
MCV: 87.6 fL (ref 80.0–100.0)
Monocytes Absolute: 0.8 10*3/uL (ref 0.1–1.0)
Monocytes Relative: 8 %
Neutro Abs: 7.5 10*3/uL (ref 1.7–7.7)
Neutrophils Relative %: 71 %
Platelets: 381 10*3/uL (ref 150–400)
RBC: 4.1 MIL/uL (ref 3.87–5.11)
RDW: 17.8 % — ABNORMAL HIGH (ref 11.5–15.5)
WBC: 10.5 10*3/uL (ref 4.0–10.5)
nRBC: 0 % (ref 0.0–0.2)

## 2021-10-19 LAB — PROTIME-INR
INR: 1.1 (ref 0.8–1.2)
Prothrombin Time: 14 seconds (ref 11.4–15.2)

## 2021-10-19 LAB — COMPREHENSIVE METABOLIC PANEL
ALT: 20 U/L (ref 0–44)
AST: 20 U/L (ref 15–41)
Albumin: 3.5 g/dL (ref 3.5–5.0)
Alkaline Phosphatase: 155 U/L — ABNORMAL HIGH (ref 38–126)
Anion gap: 10 (ref 5–15)
BUN: 25 mg/dL — ABNORMAL HIGH (ref 6–20)
CO2: 29 mmol/L (ref 22–32)
Calcium: 8.9 mg/dL (ref 8.9–10.3)
Chloride: 99 mmol/L (ref 98–111)
Creatinine, Ser: 1.28 mg/dL — ABNORMAL HIGH (ref 0.44–1.00)
GFR, Estimated: 49 mL/min — ABNORMAL LOW (ref >60)
Glucose, Bld: 82 mg/dL (ref 70–99)
Potassium: 3.6 mmol/L (ref 3.5–5.1)
Sodium: 138 mmol/L (ref 135–145)
Total Bilirubin: 0.5 mg/dL (ref 0.3–1.2)
Total Protein: 7.2 g/dL (ref 6.5–8.1)

## 2021-10-19 LAB — BRAIN NATRIURETIC PEPTIDE: B Natriuretic Peptide: 255.9 pg/mL — ABNORMAL HIGH (ref 0.0–100.0)

## 2021-10-19 LAB — APTT: aPTT: 30 seconds (ref 24–36)

## 2021-10-19 LAB — LACTIC ACID, PLASMA: Lactic Acid, Venous: 1 mmol/L (ref 0.5–1.9)

## 2021-10-19 MED ORDER — CEFAZOLIN SODIUM-DEXTROSE 1-4 GM/50ML-% IV SOLN
1.0000 g | Freq: Once | INTRAVENOUS | Status: AC
  Administered 2021-10-19: 1 g via INTRAVENOUS
  Filled 2021-10-19: qty 50

## 2021-10-19 MED ORDER — ALBUTEROL SULFATE HFA 108 (90 BASE) MCG/ACT IN AERS
2.0000 | INHALATION_SPRAY | Freq: Once | RESPIRATORY_TRACT | Status: AC
  Administered 2021-10-19: 2 via RESPIRATORY_TRACT
  Filled 2021-10-19: qty 6.7

## 2021-10-19 MED ORDER — LIDOCAINE-EPINEPHRINE (PF) 2 %-1:200000 IJ SOLN
10.0000 mL | Freq: Once | INTRAMUSCULAR | Status: AC
  Administered 2021-10-19: 10 mL
  Filled 2021-10-19: qty 20

## 2021-10-19 MED ORDER — SODIUM CHLORIDE 0.9 % IV BOLUS
1000.0000 mL | Freq: Once | INTRAVENOUS | Status: AC
  Administered 2021-10-19: 1000 mL via INTRAVENOUS

## 2021-10-19 MED ORDER — AEROCHAMBER Z-STAT PLUS/MEDIUM MISC
1.0000 | Freq: Once | Status: AC
  Administered 2021-10-19: 1

## 2021-10-19 MED ORDER — VANCOMYCIN HCL 2000 MG/400ML IV SOLN
2000.0000 mg | INTRAVENOUS | Status: AC
  Administered 2021-10-19: 2000 mg via INTRAVENOUS
  Filled 2021-10-19: qty 400

## 2021-10-19 MED ORDER — HYDROCODONE-ACETAMINOPHEN 5-325 MG PO TABS: 1.0000 | ORAL_TABLET | ORAL | 0 refills | Status: DC | PRN

## 2021-10-19 MED ORDER — SULFAMETHOXAZOLE-TRIMETHOPRIM 800-160 MG PO TABS: 1.0000 | ORAL_TABLET | Freq: Two times a day (BID) | ORAL | 0 refills | Status: AC

## 2021-10-19 NOTE — Progress Notes (Signed)
A consult was received from an ED physician for Vancomycin per pharmacy dosing.  The patient's profile has been reviewed for ht/wt/allergies/indication/available labs.    A one time order has been placed for Vancomycin 2g IV.  Further antibiotics/pharmacy consults should be ordered by admitting physician if indicated.                       Thank you, Luiz Ochoa 10/19/2021  5:14 PM

## 2021-10-19 NOTE — Progress Notes (Signed)
MELORA, MENON ([id number]) Visit Report for 10/19/2021 Arrival Information Details Patient Name: Date of Service: [name] 10/19/2021 2:00 PM Medical Record Number: [id number] Patient Account Number: [id number] Date of Birth/Sex: Treating RN: 1961-09-21 (60 y.o. [name] Primary Care Kandance Yano: Charlott Rakes Other Clinician: Referring Joab Carden: Treating Eliu Batch/Extender: Ramon Dredge in Treatment: 34 Visit Information History Since Last Visit Added or deleted any medications: No Patient Arrived: Wheel Chair Any new allergies or adverse reactions: No Arrival Time: 14:33 Had a fall or experienced change in No Accompanied By: alone activities of daily living that may affect Transfer Assistance: Manual risk of falls: Patient Identification Verified: Yes Signs or symptoms of abuse/neglect since last visito No Secondary Verification Process Completed: Yes Hospitalized since last visit: No Patient Requires Transmission-Based Precautions: No Implantable device outside of the clinic excluding No Patient Has Alerts: No cellular tissue based products placed in the center since last visit: Has Dressing in Place as Prescribed: Yes Has Compression in Place as Prescribed: Yes Pain Present Now: No Electronic Signature(s) Signed: 10/19/2021 4:57:05 PM By: Levan Hurst RN, BSN Entered By: Levan Hurst on 10/19/2021 16:00:58 -------------------------------------------------------------------------------- Clinic Level of Care Assessment Details Patient Name: Date of Service: [place], [name] 10/19/2021 2:00 PM Medical Record Number: [id number] Patient Account Number: [id number] Date of Birth/Sex: Treating RN: 09-29-1961 (60 y.o. [name] Primary Care Otilio Groleau: Charlott Rakes Other Clinician: Referring Tabari Volkert: Treating Gustavia Carie/Extender: Ramon Dredge in Treatment: 34 Clinic Level of Care Assessment  Items TOOL 4 Quantity Score X- 1 0 Use when only an EandM is performed on FOLLOW-UP visit ASSESSMENTS - Nursing Assessment / Reassessment X- 1 10 Reassessment of Co-morbidities (includes updates in patient status) X- 1 5 Reassessment of Adherence to Treatment Plan ASSESSMENTS - Wound and Skin A ssessment / Reassessment X - Simple Wound Assessment / Reassessment - one wound 1 5 []  - 0 Complex Wound Assessment / Reassessment - multiple wounds []  - 0 Dermatologic / Skin Assessment (not related to wound area) ASSESSMENTS - Focused Assessment []  - 0 Circumferential Edema Measurements - multi extremities []  - 0 Nutritional Assessment / Counseling / Intervention []  - 0 Lower Extremity Assessment (monofilament, tuning fork, pulses) []  - 0 Peripheral Arterial Disease Assessment (using hand held doppler) ASSESSMENTS - Ostomy and/or Continence Assessment and Care []  - 0 Incontinence Assessment and Management []  - 0 Ostomy Care Assessment and Management (repouching, etc.) PROCESS - Coordination of Care X - Simple Patient / Family Education for ongoing care 1 15 []  - 0 Complex (extensive) Patient / Family Education for ongoing care X- 1 10 Staff obtains Programmer, systems, Records, T Results / Process Orders est []  - 0 Staff telephones HHA, Nursing Homes / Clarify orders / etc []  - 0 Routine Transfer to another Facility (non-emergent condition) []  - 0 Routine Hospital Admission (non-emergent condition) []  - 0 New Admissions / Biomedical engineer / Ordering NPWT Apligraf, etc. , []  - 0 Emergency Hospital Admission (emergent condition) X- 1 10 Simple Discharge Coordination []  - 0 Complex (extensive) Discharge Coordination PROCESS - Special Needs []  - 0 Pediatric / Minor Patient Management []  - 0 Isolation Patient Management []  - 0 Hearing / Language / Visual special needs []  - 0 Assessment of Community assistance (transportation, D/C planning, etc.) []  - 0 Additional  assistance / Altered mentation []  - 0 Support Surface(s) Assessment (bed, cushion, seat, etc.) INTERVENTIONS - Wound Cleansing / Measurement X - Simple Wound Cleansing - one wound 1  5 []  - 0 Complex Wound Cleansing - multiple wounds []  - 0 Wound Imaging (photographs - any number of wounds) []  - 0 Wound Tracing (instead of photographs) []  - 0 Simple Wound Measurement - one wound []  - 0 Complex Wound Measurement - multiple wounds INTERVENTIONS - Wound Dressings []  - 0 Small Wound Dressing one or multiple wounds []  - 0 Medium Wound Dressing one or multiple wounds X- 1 20 Large Wound Dressing one or multiple wounds []  - 0 Application of Medications - topical []  - 0 Application of Medications - injection INTERVENTIONS - Miscellaneous []  - 0 External ear exam []  - 0 Specimen Collection (cultures, biopsies, blood, body fluids, etc.) []  - 0 Specimen(s) / Culture(s) sent or taken to Lab for analysis []  - 0 Patient Transfer (multiple staff / Civil Service fast streamer / Similar devices) []  - 0 Simple Staple / Suture removal (25 or less) []  - 0 Complex Staple / Suture removal (26 or more) []  - 0 Hypo / Hyperglycemic Management (close monitor of Blood Glucose) []  - 0 Ankle / Brachial Index (ABI) - do not check if billed separately X- 1 5 Vital Signs Has the patient been seen at the hospital within the last three years: Yes Total Score: 85 Level Of Care: New/Established - Level 3 Electronic Signature(s) Signed: 10/19/2021 4:57:05 PM By: Levan Hurst RN, BSN Entered By: Levan Hurst on 10/19/2021 16:03:12 -------------------------------------------------------------------------------- Encounter Discharge Information Details Patient Name: Date of Service: [name], [name] 10/19/2021 2:00 PM Medical Record Number: [id number] Patient Account Number: [id number] Date of Birth/Sex: Treating RN: 1960-12-26 (60 y.o. [name] Primary Care Larenda Reedy: Charlott Rakes Other  Clinician: Referring Sukanya Goldblatt: Treating Milagros Middendorf/Extender: Ramon Dredge in Treatment: 55 Encounter Discharge Information Items Discharge Condition: Stable Ambulatory Status: Wheelchair Discharge Destination: Home Transportation: Private Auto Accompanied By: alone Schedule Follow-up Appointment: Yes Clinical Summary of Care: Patient Declined Electronic Signature(s) Signed: 10/19/2021 4:57:05 PM By: Levan Hurst RN, BSN Entered By: Levan Hurst on 10/19/2021 16:04:33 -------------------------------------------------------------------------------- Patient/Caregiver Education Details Patient Name: Date of Service: [name] 12/6/2022andnbsp2:00 PM Medical Record Number: [id number] Patient Account Number: [id number] Date of Birth/Gender: Treating RN: [birth year] (60 y.o. [name] Primary Care Physician: Charlott Rakes Other Clinician: Referring Physician: Treating Physician/Extender: Ramon Dredge in Treatment: 24 Education Assessment Education Provided To: Patient Education Topics Provided Wound/Skin Impairment: Methods: Explain/Verbal Responses: State content correctly [employer]) Signed: 10/19/2021 4:57:05 PM By: Levan Hurst RN, BSN Entered By: Levan Hurst on 10/19/2021 16:04:11 -------------------------------------------------------------------------------- Wound Assessment Details Patient Name: Date of Service: [name], [name] 10/19/2021 2:00 PM Medical Record Number: [id number] Patient Account Number: [id number] Date of Birth/Sex: Treating RN: 22-Aug-1961 (60 y.o. [name] Primary Care Jesstin Studstill: Charlott Rakes Other Clinician: Referring Shakeena Kafer: Treating Ritika Hellickson/Extender: Sindy Guadeloupe Weeks in Treatment: 34 Wound Status Wound Number: 16 Primary Diabetic Wound/Ulcer of the Lower Extremity Etiology: Wound Location: Right, Plantar Amputation Site -  Transmetatarsal Wound Open Wounding Event: Gradually Appeared Status: Date Acquired: 02/05/2021 Comorbid Chronic Obstructive Pulmonary Disease (COPD), Sleep Apnea, Weeks Of Treatment: 34 History: Arrhythmia, Congestive Heart Failure, Coronary Artery Disease, Clustered Wound: No Hypertension, Peripheral Arterial Disease, Cirrhosis , Type II Diabetes, Osteoarthritis, Neuropathy Wound Measurements Length: (cm) 0.9 Width: (cm) 0.8 Depth: (cm) 0.4 Area: (cm) 0.565 Volume: (cm) 0.226 % Reduction in Area: 65.4% % Reduction in Volume: 53.9% Epithelialization: None Tunneling: No Undermining: No Wound Description Classification: Grade 2 Wound Margin: Thickened Exudate Amount: Medium Exudate Type: Serosanguineous  Exudate Color: red, brown Foul Odor After Cleansing: No Slough/Fibrino Yes Wound Bed Granulation Amount: Large (67-100%) Exposed Structure Granulation Quality: Pink, Pale Fascia Exposed: No Necrotic Amount: Small (1-33%) Fat Layer (Subcutaneous Tissue) Exposed: Yes Necrotic Quality: Adherent Slough Tendon Exposed: No Muscle Exposed: No Joint Exposed: No Bone Exposed: No Treatment Notes 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 Wound Cleanser Discharge Instruction: Cleanse the wound with wound cleanser or normal saline prior to applying a clean dressing using gauze sponges, not tissue or cotton balls. Peri-Wound Care Sween Lotion (Moisturizing lotion) Discharge Instruction: Apply moisturizing lotion as directed Topical Primary Dressing IODOFLEX 0.9% Cadexomer Iodine Pad 4x6 cm Discharge Instruction: Apply to wound bed as instructed Secondary Dressing Woven Gauze Sponge, Non-Sterile 4x4 in Discharge Instruction: Apply over primary dressing as directed. ABD Pad, 5x9 Discharge Instruction:  Apply over primary dressing as directed. Optifoam Non-Adhesive Dressing, 4x4 in Discharge Instruction: Foam donut to help offload Secured With Compression Wrap Kerlix Roll 4.5x3.1 (in/yd) Discharge Instruction: Apply Kerlix and Coban compression as directed. Coban Self-Adherent Wrap 4x5 (in/yd) Discharge Instruction: Apply over Kerlix as directed. Compression Stockings Add-Ons Electronic Signature(s) Signed: 10/19/2021 4:57:05 PM By: Levan Hurst RN, BSN Entered By: Levan Hurst on 10/19/2021 16:02:03 -------------------------------------------------------------------------------- Vitals Details Patient Name: Date of Service: [name], [name] 10/19/2021 2:00 PM Medical Record Number: [id number] Patient Account Number: [id number] Date of Birth/Sex: Treating RN: [birth year] (60 y.o. [name] Primary Care Cheral Cappucci: Charlott Rakes Other Clinician: Referring Liara Holm: Treating Dylanie Quesenberry/Extender: Sindy Guadeloupe Weeks in Treatment: 34 Vital Signs Time Taken: 14:33 Temperature (F): 98.5 Height (in): 64 Pulse (bpm): 84 Weight (lbs): 222 Respiratory Rate (breaths/min): 16 Body Mass Index (BMI): 38.1 Blood Pressure (mmHg): 117/71 Reference Range: 80 - 120 mg / dl Electronic Signature(s) Signed: 10/19/2021 4:57:05 PM By: Levan Hurst RN, BSN Entered By: Levan Hurst on 10/19/2021 16:01:29

## 2021-10-19 NOTE — Discharge Instructions (Addendum)
If the swelling or redness worsens, despite being on antibiotics, you need to come back.

## 2021-10-19 NOTE — Progress Notes (Signed)
**Note Karla-Identified via Obfuscation** AVENELL, SELLERS ([id number]) Visit Report for 10/19/2021 SuperBill Details Patient Name: Date of Service: [name] 10/19/2021 Medical Record Number: [id number] Patient Account Number: [id number] Date of Birth/Sex: Treating RN: 09-18-1961 (60 y.o. 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

## 2021-10-19 NOTE — ED Provider Notes (Signed)
[facility] DEPT Provider Note   CSN: [id number] Arrival date & time: 10/19/21  1514     History Chief Complaint  Patient presents with   Knee Pain   Fall    [name] is a 60 y.o. female.  Pt presents to the ED today with left leg pain and redness and headache.  Pt hit her left leg on 10/30.  She came to the ED and had x-rays done which showed no fx.  She has noticed a large pus pocket form in the left leg a few days ago.  She did fall again today and hit her head.  She is on Eliquis.      Past Medical History:  Diagnosis Date   Alcohol abuse    Alcoholic cirrhosis ([place])    Anemia    Anxiety    Arthritis    "knees" (11/26/2018)   B12 deficiency    CAD (coronary artery disease)    a. 11/10/2014 Cath: LM nl, LAD min irregs, D1 30 ost, D2 50d, LCX 43m, OM1 80 p/m (1.5 mm vessel), OM2 63m, RCA nondom 44m-->med rx.. Demand ischemia in the setting of rapid a-fib.   Cardiomyopathy ([place])    Carotid artery disease ([place])    a. 01/2015 Carotid Angio: RICA 409, LICA 81X; b. 07/1477 s/p L CEA; c. 05/2019 Carotid U/S: RICA 100. RECA >50. LICA 2-95%.   Cellulitis    lower extremities   CHF (congestive heart failure) ([name])    Chronic combined systolic and diastolic CHF (congestive heart failure) ([place])    a. 10/2014 Echo: EF 40-45%; b. 10/2018 Echo: EF 45-50%, gr2 DD; c. 11/2019 Echo: EF 50%, mild LVH, gr2 DD (restrictive), antlat HK, Nl RV fxn. Mild BAE. RVSP 59.32mmHg.   CKD (chronic kidney disease), stage III ([name])    Cocaine abuse ([name])    COPD (chronic obstructive pulmonary disease) ([name])    Critical lower limb ischemia ([name])    Depression    Diabetes mellitus without complication ([name])    Diabetic peripheral neuropathy ([name])    DVT (deep venous thrombosis) ([name])    Dyspnea    Elevated troponin    a. Chronic elevation.   GERD (gastroesophageal reflux disease)    Hyperlipemia    Hypertension    Hypokalemia    Hypomagnesemia    Hypothyroidism     Marijuana abuse    Narcotic abuse ([place])    Noncompliance    NSVT (nonsustained ventricular tachycardia)    Obesity    PAF (paroxysmal atrial fibrillation) ([name])    Paroxysmal atrial tachycardia ([name])    Peripheral arterial disease ([place])    a. 01/2015 Angio/PTA: RSFA 99 (atherectomy/pta) - 1 vessel runoff via diff dzs peroneal; b. 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   Pneumonia    "once or twice" (11/26/2018)   Poorly controlled type 2 diabetes mellitus ([place])    Renal disorder    Renal insufficiency    a. 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   Sleep apnea    "couldn't handle wearing the mask" (11/26/2018)   Symptomatic bradycardia    a. Avoid AV blocking agent per EP. Prev req temp wire in 2017.   Tobacco abuse     Patient Active Problem List   Diagnosis Date Noted   Hyperlipidemia associated with type 2 diabetes mellitus ([place]) 07/29/2021   (HFpEF) heart failure with preserved ejection fraction ([place]) 07/29/2021   Bradycardia 06/13/2021   Uncontrolled type 2  diabetes mellitus with hyperglycemia ([place]) 06/13/2021   Acute kidney injury superimposed on CKD ([place]) 06/13/2021   Critical ischemia of foot ([place]) 06/07/2021   Fall 05/05/2021   PAF (paroxysmal atrial fibrillation) ([place]) 05/05/2021   COPD (chronic obstructive pulmonary disease) ([place]) 05/05/2021   DM (diabetes mellitus), type 2 with complications ([place]) 10/93/2355   PAD (peripheral artery disease) ([place]) 05/05/2021   Cellulitis 05/05/2021   Acute on chronic combined systolic and diastolic CHF (congestive heart failure) ([place]) 05/05/2021   OSA (obstructive sleep apnea) 05/05/2021   CKD (chronic kidney disease) stage 3, GFR 30-59 ml/min ([place]) 05/05/2021   AKI (acute kidney injury) ([place]) 05/04/2021   Pressure injury of skin 05/04/2021   Ulcer of foot, unspecified laterality, limited to breakdown of skin ([place]) 02/17/2021   Altered mental status 01/02/2021   AF (paroxysmal atrial fibrillation) ([place]) 01/02/2021   CAD  (coronary artery disease) 01/02/2021   PVD (peripheral vascular disease) ([place]) 01/02/2021   Acute renal failure superimposed on stage 4 chronic kidney disease ([place]) 01/02/2021   Diabetes mellitus type 2, uncontrolled, with complications 73/22/0254   Diabetic nephropathy ([place]) 01/02/2021   Low back pain 06/01/2020   Hyperlipidemia 04/03/2020   Muscle weakness 03/20/2020   Closed fracture of neck of right radius 03/13/2020   Pain in elbow 03/12/2020   PAD (peripheral artery disease) ([place]) 12/26/2019   Acute renal failure ([name])    Acute respiratory failure with hypoxia ([place]) 12/02/2019   Acute exacerbation of CHF (congestive heart failure) ([place]) 12/01/2019   Cellulitis in diabetic foot ([place]) 07/08/2019   Osteomyelitis ([place]) 06/21/2019   NICM (nonischemic cardiomyopathy) ([place]) 06/20/2019   Non-healing ulcer ([place]) 06/20/2019   Acute CHF (congestive heart failure) ([place]) 11/26/2018   CHF (congestive heart failure) ([place]) 11/26/2018   Acute respiratory failure ([place]) 10/21/2018   AKI (acute kidney injury) ([place]) 08/18/2018   Coagulation disorder ([place]) 08/09/2017   Depression 07/21/2017   At risk for adverse drug reaction 06/20/2017   Peripheral neuropathy 06/20/2017   Acute osteomyelitis of right foot ([place]) 06/13/2017   S/P transmetatarsal amputation of foot, right ([place]) 06/05/2017   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 ([place]) 05/19/2017   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[place])    SIRS (systemic inflammatory response syndrome) ([place]) 04/06/2017   CKD (chronic kidney disease), stage III ([place]) 11/24/2016   Suspected sleep apnea 11/24/2016   Ulcer of toe of right foot, with necrosis of bone ([place]) 10/27/2016   Ulcer of left lower leg ([place]) 05/19/2016   Severe obesity (BMI >= 40) ([place]) 02/24/2016   COPD GOLD 0  02/24/2016   Morbid obesity ([place]) 02/24/2016   Encounter for therapeutic drug monitoring 02/10/2016   Symptomatic bradycardia  01/12/2016   Hypertension associated with diabetes ([place]) 12/22/2015   Chronic combined systolic and diastolic CHF (congestive heart failure) ([name])    [place]    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 11/08/2015   Tobacco abuse 10/23/2015   Coronary artery disease    DOE (dyspnea on exertion) 04/29/2015   Diabetes mellitus type 2, uncontrolled 02/08/2015   Influenza A 02/07/2015   Wrist fracture, left, with routine healing, subsequent encounter 02/05/2015   Wrist fracture, left, closed, initial encounter 01/29/2015   PAF (paroxysmal atrial fibrillation) ([place]) 01/16/2015   Carotid arterial disease ([place]) 01/16/2015   Claudication ([place]) 01/15/2015   Demand ischemia ([place]) 10/29/2014   Insomnia 02/03/2014   COPD with acute exacerbation ([place]) 02/01/2014   S/P peripheral artery angioplasty - [place]bo[place] atherectomy; R SFA 09/11/2013  Class: Acute   Leg pain, bilateral 08/19/2013   Hypothyroidism 07/31/2013   Cellulitis 06/13/2013   History of cocaine abuse ([place]) 06/13/2013   Long term current use of anticoagulant therapy 05/20/2013   Alcohol abuse    Narcotic abuse ([place])    Marijuana abuse    Alcoholic cirrhosis ([place])    DM (diabetes mellitus), type 2 with peripheral vascular complications [facility])     Past Surgical History:  Procedure Laterality Date   ABDOMINAL AORTOGRAM N/A 06/26/2019   Procedure: ABDOMINAL AORTOGRAM;  Surgeon: Wellington Hampshire, MD;  Location: [facility];  Service: Cardiovascular;  Laterality: N/A;   ABDOMINAL AORTOGRAM W/LOWER EXTREMITY N/A 06/07/2021   Procedure: ABDOMINAL AORTOGRAM W/LOWER EXTREMITY;  Surgeon: [name], MD;  Location: [facility];  Service: Cardiovascular;  Laterality: N/A;   AMPUTATION Right 06/14/2017   Procedure: Right foot transmetatarsal amputation;  Surgeon: Newt Minion, MD;  Location: [facility];  Service: Orthopedics;  Laterality: Right;   AMPUTATION Left 06/28/2019   Procedure: AMPUTATION LEFT FIFTH TOE;  Surgeon: Rosetta Posner, MD;   Location: [facility];  Service: Vascular;  Laterality: Left;   AMPUTATION TOE Right 04/28/2017   Procedure: AMPUTATION OF RIGHT SECOND RAY;  Surgeon: Newt Minion, MD;  Location: [facility];  Service: Orthopedics;  Laterality: Right;   CARDIAC CATHETERIZATION     CARDIAC CATHETERIZATION N/A 01/12/2016   Procedure: Temporary Wire;  Surgeon: Minus Breeding, MD;  Location: [facility] LAB;  Service: Cardiovascular;  Laterality: N/A;   CARDIOVERSION  ~ 02/2013   "twice"    CAROTID ANGIOGRAM N/A 01/15/2015   Procedure: CAROTID ANGIOGRAM;  Surgeon: [name], MD;  Location: [facility] CATH LAB;  Service: Cardiovascular;  Laterality: N/A;   DILATION AND CURETTAGE OF UTERUS  1988   ENDARTERECTOMY Left 02/19/2015   Procedure: LEFT CAROTID ENDARTERECTOMY ;  Surgeon: Serafina Mitchell, MD;  Location: [name];  Service: Vascular;  Laterality: Left;   FEMORAL-TIBIAL BYPASS GRAFT Left 06/28/2019   Procedure: BYPASS GRAFT LEFT LEG FEMORAL TO ANTERIOR TIBIAL ARTERY using LEFT GREATER SAPHENOUS VEIN;  Surgeon: Rosetta Posner, MD;  Location: [facility];  Service: Vascular;  Laterality: Left;   LEFT HEART CATHETERIZATION WITH CORONARY ANGIOGRAM N/A 10/31/2014   Procedure: LEFT HEART CATHETERIZATION WITH CORONARY ANGIOGRAM;  Surgeon: Burnell Blanks, MD;  Location: [facility] CATH LAB;  Service: Cardiovascular;  Laterality: N/A;   LOWER EXTREMITY ANGIOGRAM N/A 09/10/2013   Procedure: LOWER EXTREMITY ANGIOGRAM;  Surgeon: [name], MD;  Location: [facility] CATH LAB;  Service: Cardiovascular;  Laterality: N/A;   LOWER EXTREMITY ANGIOGRAM N/A 01/15/2015   Procedure: LOWER EXTREMITY ANGIOGRAM;  Surgeon: [name], MD;  Location: [facility] CATH LAB;  Service: Cardiovascular;  Laterality: N/A;   LOWER EXTREMITY ANGIOGRAPHY N/A 04/13/2017   Procedure: Lower Extremity Angiography;  Surgeon: [name], MD;  Location: [facility] LAB;  Service: Cardiovascular;  Laterality: N/A;   LOWER EXTREMITY ANGIOGRAPHY Left 06/26/2019   Procedure:  LOWER EXTREMITY ANGIOGRAPHY;  Surgeon: Wellington Hampshire, MD;  Location: [facility];  Service: Cardiovascular;  Laterality: Left;   PERIPHERAL VASCULAR ATHERECTOMY Right 06/07/2021   Procedure: PERIPHERAL VASCULAR ATHERECTOMY;  Surgeon: [name], MD;  Location: [facility];  Service: Cardiovascular;  Laterality: Right;   PERIPHERAL VASCULAR BALLOON ANGIOPLASTY Left 06/26/2019   Procedure: PERIPHERAL VASCULAR BALLOON ANGIOPLASTY;  Surgeon: Wellington Hampshire, MD;  Location: [facility];  Service: Cardiovascular;  Laterality: Left;  unable to cross lt sfa occlusion   PERIPHERAL VASCULAR INTERVENTION  04/13/2017   Procedure: Peripheral Vascular Intervention;  Surgeon: [name], MD;  Location: [facility];  Service: Cardiovascular;;   PRESSURE SENSOR/CARDIOMEMS N/A 02/05/2020   Procedure: PRESSURE SENSOR/CARDIOMEMS;  Surgeon: Larey Dresser, MD;  Location: [facility];  Service: Cardiovascular;  Laterality: N/A;   VEIN HARVEST Left 06/28/2019   Procedure: LEFT LEG GREATER SAPHENOUS VEIN HARVEST;  Surgeon: Rosetta Posner, MD;  Location: [facility];  Service: Vascular;  Laterality: Left;     OB History     Gravida  1   Para  1   Term  1   Preterm  0   AB  0   Living         SAB  0   IAB  0   Ectopic  0   Multiple      Live Births              Family History  Problem Relation Age of Onset   Hypertension Mother    Diabetes Mother    Cancer Mother        breast, ovarian, colon   Clotting disorder Mother    Heart disease Mother    Heart attack Mother    Breast cancer Mother        in 86's   Hypertension Father    Heart disease Father    Emphysema Sister        smoked    Social History   Tobacco Use   Smoking status: Every Day    Packs/day: 1.00    Years: 44.00    Pack years: 44.00    Types: E-cigarettes, Cigarettes   Smokeless tobacco: Former    Types: Snuff  Vaping Use   Vaping Use: Former   Devices: 11/26/2018  "stopped months ago"  Substance Use Topics   Alcohol use: Yes    Comment: occ   Drug use: Yes    Types: "Crack" cocaine, Marijuana, Oxycodone    Home Medications Prior to Admission medications   Medication Sig Start Date End Date Taking? Authorizing Provider  HYDROcodone-acetaminophen (NORCO/VICODIN) 5-325 MG tablet Take 1 tablet by mouth every 4 (four) hours as needed. 10/19/21  Yes Isla Pence, MD  sulfamethoxazole-trimethoprim (BACTRIM DS) 800-160 MG tablet Take 1 tablet by mouth 2 (two) times daily for 7 days. 10/19/21 10/26/21 Yes Isla Pence, MD  ACCU-CHEK GUIDE test strip USE TO CHECK BLOOD SUGAR FOUR TIMES DAILY 02/25/20   [provider]  acetaminophen (TYLENOL) 500 MG tablet Take 500 mg by mouth every 6 (six) hours as needed for moderate pain.    [provider]  albuterol (VENTOLIN HFA) 108 (90 Base) MCG/ACT inhaler Inhale 2 puffs into the lungs every 6 (six) hours as needed for wheezing or shortness of breath. 07/30/21   Danford, Suann Larry, MD  allopurinol (ZYLOPRIM) 100 MG tablet Take 1 tablet (100 mg total) by mouth daily. 08/12/21   Charlott Rakes, MD  amiodarone (PACERONE) 200 MG tablet TAKE 1/2 TABLET (100 MG TOTAL) BY MOUTH DAILY. 09/13/21   Larey Dresser, MD  amLODipine (NORVASC) 10 MG tablet Take 1 tablet (10 mg total) by mouth daily. 10/15/20   Larey Dresser, MD  apixaban (ELIQUIS) 5 MG TABS tablet Take 5 mg by mouth 2 (two) times daily.    [provider]  atorvastatin (LIPITOR) 40 MG tablet Take 1 tablet (40 mg total) by mouth every  evening. 10/14/21   Charlott Rakes, MD  benzonatate (TESSALON) 100 MG capsule Take 1 capsule (100 mg total) by mouth 2 (two) times daily as needed for cough. Patient not taking: Reported on 10/06/2021 09/23/21   Argentina Donovan, PA-C  budesonide-formoterol [facility]) 160-4.5 MCG/ACT inhaler Inhale 2 puffs into the lungs 2 (two) times daily. 12/09/19   Eugenie Filler, MD  clopidogrel (PLAVIX) 75  MG tablet Take 1 tablet (75 mg total) by mouth daily. 10/05/21   Bensimhon, Shaune Pascal, MD  colchicine 0.6 MG tablet Take 2 tabs (1.2 mg) at the onset of a gout flare, may take 1 tab (0.6 mg) in 1 hour if symptoms 08/11/21   Charlott Rakes, MD  Continuous Blood Gluc Sensor (FREESTYLE LIBRE 2 SENSOR) MISC USE 1 (ONE) EACH EVERY 2 WEEKS Patient not taking: Reported on 08/19/2021 07/14/21   Charlott Rakes, MD  Dulaglutide (TRULICITY) 1.5 HM/0.9OB SOPN Inject 1.5 mg into the skin once a week. 08/11/21   Charlott Rakes, MD  empagliflozin (JARDIANCE) 10 MG TABS tablet Take 1 tablet (10 mg total) by mouth daily before breakfast. 04/01/21   Clegg, [name] D, NP  ferrous sulfate 324 MG TBEC Take 324 mg by mouth.    [provider]  FLUoxetine (PROZAC) 20 MG capsule Take 1 capsule (20 mg total) by mouth at bedtime. 07/14/21   Charlott Rakes, MD  folic acid (FOLVITE) 1 MG tablet Take 1 tablet (1 mg total) by mouth daily. 07/14/21   Charlott Rakes, MD  hydrALAZINE (APRESOLINE) 50 MG tablet Take 1 tablet (50 mg total) by mouth 3 (three) times daily. 08/11/21   Charlott Rakes, MD  insulin glargine (LANTUS) 100 UNIT/ML Solostar Pen Inject 30 Units into the skin daily. 07/14/21 07/14/22  Charlott Rakes, MD  insulin lispro (HUMALOG) 100 UNIT/ML KwikPen Inject 8 Units into the skin 3 (three) times daily with meals. 01/05/21   Allie Bossier, MD  Insulin Pen Needle 32G X 4 MM MISC USE AS DIRECTED WITH INSULIN PENS 01/05/21 01/05/22  Allie Bossier, MD  levothyroxine (SYNTHROID) 50 MCG tablet Take 1 tablet (50 mcg total) by mouth daily. 10/11/21   Argentina Donovan, PA-C  losartan (COZAAR) 25 MG tablet Take 2 tablets (50 mg total) by mouth daily. Patient taking differently: Take 50 mg by mouth at bedtime. 08/16/21 11/14/21  Rafael Bihari, FNP  Misc. Devices MISC 2 L oxygen continuously via nasal cannula.  Diagnosis chronic respiratory failure with hypoxia Patient not taking: Reported on 09/02/2021 08/19/21   Charlott Rakes, MD  nortriptyline (PAMELOR) 10 MG capsule Take 1 capsule (10 mg total) by mouth every other day for 14 days, THEN 1 capsule (10 mg total) 2 (two) times a week for 14 days. Then discontinue. 09/15/21 10/13/21  Charlott Rakes, MD  Omega-3 Fatty Acids (FISH OIL PO) Take 1 capsule by mouth daily.    [provider]  ondansetron (ZOFRAN) 4 MG tablet Take 1 tablet (4 mg total) by mouth every 8 (eight) hours as needed for nausea or vomiting. 08/11/21   Charlott Rakes, MD  pantoprazole (PROTONIX) 40 MG tablet Take 1 tablet (40 mg total) by mouth daily. 02/21/20   Larey Dresser, MD  predniSONE (DELTASONE) 20 MG tablet Take 2 tablets (40 mg total) by mouth daily with breakfast. Patient not taking: Reported on 08/19/2021 08/11/21   Charlott Rakes, MD  torsemide (DEMADEX) 20 MG tablet Take 4 tablets (80 mg total) by mouth in the morning AND 3 tablets (60 mg  total) every evening. 10/05/21   Bensimhon, Shaune Pascal, MD  VITAMIN D PO Take 1 tablet by mouth daily.    [provider]  tiotropium (SPIRIVA HANDIHALER) 18 MCG inhalation capsule Place 1 capsule (18 mcg total) into inhaler and inhale every morning. Patient not taking: Reported on 02/09/2021 12/09/19 02/09/21  Eugenie Filler, MD    Allergies    Gabapentin and Lyrica [pregabalin]  Review of Systems   Review of Systems  Musculoskeletal:        Left leg pain  Neurological:  Positive for headaches.  All other systems reviewed and are negative.  Physical Exam Updated Vital Signs BP (!) 125/57   Pulse 79   Temp 98.3 F (36.8 C) (Oral)   Resp 13   Ht 5\' 4"  (1.626 m)   Wt 95.7 kg   LMP  (LMP Unknown)   SpO2 99%   BMI 36.22 kg/m   Physical Exam Vitals and nursing note reviewed.  Constitutional:      Appearance: Normal appearance.  HENT:     Head: Normocephalic and atraumatic.     Right Ear: External ear normal.     Left Ear: External ear normal.     Nose: Nose normal.     Mouth/Throat:     Mouth: Mucous  membranes are moist.     Pharynx: Oropharynx is clear.  Eyes:     Extraocular Movements: Extraocular movements intact.     Conjunctiva/sclera: Conjunctivae normal.     Pupils: Pupils are equal, round, and reactive to light.  Cardiovascular:     Rate and Rhythm: Normal rate and regular rhythm.     Pulses: Normal pulses.     Heart sounds: Normal heart sounds.  Pulmonary:     Effort: Pulmonary effort is normal.     Breath sounds: Wheezing present.  Abdominal:     General: Abdomen is flat. Bowel sounds are normal.     Palpations: Abdomen is soft.  Musculoskeletal:        General: Normal range of motion.     Cervical back: Normal range of motion and neck supple.  Skin:    General: Skin is warm.     Capillary Refill: Capillary refill takes less than 2 seconds.     Comments: Large abscess to anterior shin + cellulitis  Neurological:     General: No focal deficit present.     Mental Status: She is alert and oriented to person, place, and time.  Psychiatric:        Mood and Affect: Mood normal.        Behavior: Behavior normal.     ED Results / Procedures / Treatments   Labs (all labs ordered are listed, but only abnormal results are displayed) Labs Reviewed  COMPREHENSIVE METABOLIC PANEL - Abnormal; Notable for the following components:      Result Value   BUN 25 (*)    Creatinine, Ser 1.28 (*)    Alkaline Phosphatase 155 (*)    GFR, Estimated 49 (*)    All other components within normal limits  CBC WITH DIFFERENTIAL/PLATELET - Abnormal; Notable for the following components:   Hemoglobin 10.6 (*)    HCT 35.9 (*)    MCH 25.9 (*)    MCHC 29.5 (*)    RDW 17.8 (*)    All other components within normal limits  BRAIN NATRIURETIC PEPTIDE - Abnormal; Notable for the following components:   B Natriuretic Peptide 255.9 (*)    All other components  within normal limits  LACTIC ACID, PLASMA  PROTIME-INR  APTT  LACTIC ACID, PLASMA    EKG None  Radiology DG Chest 2  View  Result Date: 10/19/2021 CLINICAL DATA:  Fall 3 weeks ago and 3 days ago EXAM: CHEST - 2 VIEW COMPARISON:  Chest radiograph 08/10/2021 FINDINGS: The cardiomediastinal silhouette is stable. A metallic density object projecting over the left infrahilar region is unchanged, and again may reflect a pulmonary artery pressure monitor. There is no focal consolidation or pulmonary edema. There is no pleural effusion or pneumothorax. There is no acute osseous abnormality. IMPRESSION: Stable chest with no radiographic evidence of acute cardiopulmonary process. Electronically Signed   By: Valetta Mole M.D.   On: 10/19/2021 16:56   DG Tibia/Fibula Left  Result Date: 10/19/2021 CLINICAL DATA:  Fall, concern for abscess and infection. EXAM: LEFT TIBIA AND FIBULA - 2 VIEW COMPARISON:  Left tibia and fibula x-ray 09/12/2021. FINDINGS: There is no evidence of fracture or other focal bone lesions. There is soft tissue swelling of the lower extremity. Multiple surgical clips are seen in the soft tissues. IMPRESSION: No acute bony abnormality. Electronically Signed   By: Ronney Asters M.D.   On: 10/19/2021 16:55   CT HEAD WO CONTRAST (5MM)  Result Date: 10/19/2021 CLINICAL DATA:  Headache. EXAM: CT HEAD WITHOUT CONTRAST TECHNIQUE: Contiguous axial images were obtained from the base of the skull through the vertex without intravenous contrast. COMPARISON:  CT head 09/13/2021, MRI head 01/02/2021 BRAIN: BRAIN Patchy and confluent areas of decreased attenuation are noted throughout the deep and periventricular white matter of the cerebral hemispheres bilaterally, compatible with chronic microvascular ischemic disease. Chronic right frontal and parietal white matter infarction. No evidence of large-territorial acute infarction. No parenchymal hemorrhage. No mass lesion. No extra-axial collection. No mass effect or midline shift. No hydrocephalus. Basilar cisterns are patent. Vascular: No hyperdense vessel. Skull: No acute  fracture or focal lesion. Sinuses/Orbits: Paranasal sinuses and mastoid air cells are clear. The orbits are unremarkable. Other: None. IMPRESSION: 1. No acute intracranial abnormality. 2. Please see separately dictated CT C-spine 10/19/2021. Electronically Signed   By: [name] M.D.   On: 10/19/2021 17:04   CT Cervical Spine Wo Contrast  Result Date: 10/19/2021 CLINICAL DATA:  Neck trauma. Midline tenderness. Patient fell about 3 weeks ago. EXAM: CT CERVICAL SPINE WITHOUT CONTRAST TECHNIQUE: Multidetector CT imaging of the cervical spine was performed without intravenous contrast. Multiplanar CT image reconstructions were also generated. COMPARISON:  CT examination dated September 13, 2021 FINDINGS: Alignment: Normal. Skull base and vertebrae: No acute fracture. No primary bone lesion or focal pathologic process. Soft tissues and spinal canal: No prevertebral fluid or swelling. No visible canal hematoma. Disc levels: Multilevel DA and disc disease. Disc space narrowing and subchondral sclerosis at C6-C7. Uncovertebral joint hypertrophy at C6-C7. Upper chest: Negative. Other: None. IMPRESSION: 1.  No acute fracture or subluxation. 2.  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 3. Evaluation of ligamentous injury is limited on CT scan. MRI examination could be considered if symptoms persist. Electronically Signed   By: Keane Police D.O.   On: 10/19/2021 17:23    Procedures .Marland KitchenIncision and Drainage  Date/Time: 10/19/2021 6:51 PM Performed by: Isla Pence, MD Authorized by: Isla Pence, MD   Consent:    Consent obtained:  Verbal   Consent given by:  Patient   Risks discussed:  Incomplete drainage   Alternatives discussed:  No treatment Universal protocol:    Patient identity confirmed:  Verbally  with patient Location:    Type:  Abscess   Location:  Lower extremity   Lower extremity location:  Leg   Leg location:  L lower leg Pre-procedure details:    Skin preparation:   Chlorhexidine Sedation:    Sedation type:  None Anesthesia:    Anesthesia method:  None Procedure type:    Complexity:  Simple Procedure details:    Ultrasound guidance: no     Incision types:  Cruciate   Wound management:  Probed and deloculated   Drainage:  Purulent   Drainage amount:  Copious Post-procedure details:    Procedure completion:  Tolerated well, no immediate complications   Medications Ordered in ED Medications  ceFAZolin (ANCEF) IVPB 1 g/50 mL premix (1 g Intravenous New Bag/Given 10/19/21 2035)  sodium chloride 0.9 % bolus 1,000 mL (0 mLs Intravenous Stopped 10/19/21 2041)  lidocaine-EPINEPHrine (XYLOCAINE W/EPI) 2 %-1:200000 (PF) injection 10 mL (10 mLs Infiltration Given 10/19/21 2002)  vancomycin (VANCOREADY) IVPB 2000 mg/400 mL (0 mg Intravenous Stopped 10/19/21 2041)  albuterol (VENTOLIN HFA) 108 (90 Base) MCG/ACT inhaler 2 puff (2 puffs Inhalation Given 10/19/21 2005)  aerochamber Z-Stat Plus/medium 1 each (1 each Other Given 10/19/21 2003)    ED Course  I have reviewed the triage vital signs and the nursing notes.  Pertinent labs & imaging results that were available during my care of the patient were reviewed by me and considered in my medical decision making (see chart for details).    MDM Rules/Calculators/A&P                           Pt's abscess drained a lot of fluid.  Pt given a dose of vancomycin and ancef.  She had some wheezes, so was given a puff of her albuterol.  She is d/c with a rx for bactrim.  She knows to return if redness worsens despite abscess drainage and abx. Final Clinical Impression(s) / ED Diagnoses Final diagnoses:  Cellulitis of left lower extremity  Abscess    Rx / DC Orders ED Discharge Orders          Ordered    sulfamethoxazole-trimethoprim (BACTRIM DS) 800-160 MG tablet  2 times daily        10/19/21 2035    HYDROcodone-acetaminophen (NORCO/VICODIN) 5-325 MG tablet  Every 4 hours PRN        10/19/21 2035              Isla Pence, MD 10/19/21 2046

## 2021-10-19 NOTE — ED Provider Notes (Signed)
Emergency Medicine Provider Triage Evaluation Note  [name] , a 60 y.o. female  was evaluated in triage.  Pt complains of 2 complaints. She states that she fell about a month ago and struck the left shin on the floor.  She was seen here and got x-rays which were unremarkable.  She states that over the past few days she has had a large knot pop up.  She feels like it is a pus pocket that is ready to burst. She notes that 3 days ago she did fall again, she states that she struck her head.  She takes Eliquis.  She states her last dose was this morning.  She denies any fevers.  She was noted to be confused in triage note unable to answer the year, when I asked her she is able to tell me it is 2022.  Review of Systems  Positive: Head injury on thinners, leg redness, pain Negative: Fever  Physical Exam  BP 136/82   Pulse 78   Temp 98.3 F (36.8 C) (Oral)   Resp 17   LMP  (LMP Unknown)   SpO2 96%  Gen:   Awake, no distress   Resp:  Normal effort  MSK:   Moves extremities without difficulty, entire left lower leg is erythematous with a large bullae that is fluctuant.  Other:  Contusion on left sdied forehead  Medical Decision Making  Medically screening exam initiated at 4:15 PM.  Appropriate orders placed.  MARABETH MELLAND was informed that the remainder of the evaluation will be completed by another provider, this initial triage assessment does not replace that evaluation, and the importance of remaining in the ED until their evaluation is complete.  Patient is anticoagulated and fell 3 days ago striking her head.  She does report mild neck pain.  Will obtain CT scan head and neck. She has a large swelling with redness on the left lower leg.  She states that showed up after she had a fall.  Given the extent of the erythema I am concerned for extensive infection.  The leg itself is warm to the touch.  She does not meet sepsis criteria at this time as she is afebrile not tachycardic or  tachypneic.  Will obtain labs.   Ollen Gross 10/19/21 1618    Carmin Muskrat, MD 10/19/21 2256

## 2021-10-19 NOTE — ED Triage Notes (Addendum)
Pt states she fell about 3 weeks ago. Pt struck her left knee of the floor. Pt states since the fall she has had significant swelling and the area is now red and warm to the touch. Denies fever.   Pt also reports another fall 3 days ago and she hit the left side of her head. No loc, but patient is on eliquis. Pt is alert to name, dob, and situation, but patient incorrectly answered the wrong year multiple times when asked what year it currently is.

## 2021-10-20 ENCOUNTER — Other Ambulatory Visit: Payer: Self-pay | Admitting: Pharmacist

## 2021-10-20 ENCOUNTER — Telehealth (HOSPITAL_COMMUNITY): Payer: Self-pay

## 2021-10-20 MED ORDER — PANTOPRAZOLE SODIUM 40 MG PO TBEC: 40.0000 mg | DELAYED_RELEASE_TABLET | Freq: Every day | ORAL | 0 refills | Status: DC

## 2021-10-20 NOTE — Telephone Encounter (Signed)
I contacted pt regarding reminder of her appoint tomor and to bring her meds and pill boxes with her so I can fill them while she is there.  She said pharmacy only brought the atorvastatin, not the rest.  I contacted pharmacy and they needed new rx for the pantoprazole, but was able to fill the folic acid and her thyroid med.   I will p/u in the morning and meet her in clinic for the visit.   Marylouise Stacks, EMT-Paramedic  10/20/21

## 2021-10-21 ENCOUNTER — Other Ambulatory Visit (HOSPITAL_COMMUNITY): Payer: Self-pay

## 2021-10-21 ENCOUNTER — Ambulatory Visit (HOSPITAL_COMMUNITY)
Admission: RE | Admit: 2021-10-21 | Discharge: 2021-10-21 | Disposition: A | Discharge status: Home / Self Care | Payer: Medicare Other | Source: Ambulatory Visit | Attending: Cardiology | Admitting: Cardiology

## 2021-10-21 ENCOUNTER — Encounter (HOSPITAL_COMMUNITY): Payer: Self-pay | Admitting: Cardiology

## 2021-10-21 ENCOUNTER — Other Ambulatory Visit: Payer: Self-pay

## 2021-10-21 VITALS — BP 134/60 | HR 88 | Wt 213.8 lb

## 2021-10-21 DIAGNOSIS — K7460 Unspecified cirrhosis of liver: Secondary | ICD-10-CM | POA: Diagnosis not present

## 2021-10-21 DIAGNOSIS — Z7902 Long term (current) use of antithrombotics/antiplatelets: Secondary | ICD-10-CM | POA: Diagnosis not present

## 2021-10-21 DIAGNOSIS — G4733 Obstructive sleep apnea (adult) (pediatric): Secondary | ICD-10-CM | POA: Insufficient documentation

## 2021-10-21 DIAGNOSIS — I130 Hypertensive heart and chronic kidney disease with heart failure and stage 1 through stage 4 chronic kidney disease, or unspecified chronic kidney disease: Secondary | ICD-10-CM | POA: Diagnosis not present

## 2021-10-21 DIAGNOSIS — I5042 Chronic combined systolic (congestive) and diastolic (congestive) heart failure: Secondary | ICD-10-CM | POA: Diagnosis not present

## 2021-10-21 DIAGNOSIS — Z79899 Other long term (current) drug therapy: Secondary | ICD-10-CM | POA: Diagnosis not present

## 2021-10-21 DIAGNOSIS — Z7984 Long term (current) use of oral hypoglycemic drugs: Secondary | ICD-10-CM | POA: Insufficient documentation

## 2021-10-21 DIAGNOSIS — E039 Hypothyroidism, unspecified: Secondary | ICD-10-CM | POA: Diagnosis not present

## 2021-10-21 DIAGNOSIS — Z7985 Long-term (current) use of injectable non-insulin antidiabetic drugs: Secondary | ICD-10-CM | POA: Insufficient documentation

## 2021-10-21 DIAGNOSIS — J449 Chronic obstructive pulmonary disease, unspecified: Secondary | ICD-10-CM | POA: Insufficient documentation

## 2021-10-21 DIAGNOSIS — Z794 Long term (current) use of insulin: Secondary | ICD-10-CM | POA: Insufficient documentation

## 2021-10-21 DIAGNOSIS — Z79890 Hormone replacement therapy (postmenopausal): Secondary | ICD-10-CM | POA: Insufficient documentation

## 2021-10-21 DIAGNOSIS — N183 Chronic kidney disease, stage 3 unspecified: Secondary | ICD-10-CM | POA: Insufficient documentation

## 2021-10-21 DIAGNOSIS — I10 Essential (primary) hypertension: Secondary | ICD-10-CM | POA: Diagnosis not present

## 2021-10-21 DIAGNOSIS — Z7901 Long term (current) use of anticoagulants: Secondary | ICD-10-CM | POA: Diagnosis not present

## 2021-10-21 DIAGNOSIS — E1122 Type 2 diabetes mellitus with diabetic chronic kidney disease: Secondary | ICD-10-CM | POA: Insufficient documentation

## 2021-10-21 DIAGNOSIS — I2510 Atherosclerotic heart disease of native coronary artery without angina pectoris: Secondary | ICD-10-CM | POA: Diagnosis not present

## 2021-10-21 DIAGNOSIS — Z87891 Personal history of nicotine dependence: Secondary | ICD-10-CM | POA: Insufficient documentation

## 2021-10-21 DIAGNOSIS — Z7951 Long term (current) use of inhaled steroids: Secondary | ICD-10-CM | POA: Diagnosis not present

## 2021-10-21 DIAGNOSIS — I5032 Chronic diastolic (congestive) heart failure: Secondary | ICD-10-CM | POA: Diagnosis not present

## 2021-10-21 DIAGNOSIS — E1151 Type 2 diabetes mellitus with diabetic peripheral angiopathy without gangrene: Secondary | ICD-10-CM | POA: Insufficient documentation

## 2021-10-21 DIAGNOSIS — I480 Paroxysmal atrial fibrillation: Secondary | ICD-10-CM | POA: Diagnosis not present

## 2021-10-21 MED ORDER — AMLODIPINE BESYLATE 10 MG PO TABS: 10.0000 mg | ORAL_TABLET | Freq: Every day | ORAL | 3 refills | Status: DC

## 2021-10-21 MED ORDER — POTASSIUM CHLORIDE CRYS ER 20 MEQ PO TBCR: 20.0000 meq | EXTENDED_RELEASE_TABLET | Freq: Every day | ORAL | 3 refills | Status: DC

## 2021-10-21 MED ORDER — TORSEMIDE 20 MG PO TABS: 80.0000 mg | ORAL_TABLET | Freq: Two times a day (BID) | ORAL | 11 refills | Status: DC

## 2021-10-21 MED ORDER — BUPROPION HCL ER (SR) 150 MG PO TB12: ORAL_TABLET | ORAL | 2 refills | Status: DC

## 2021-10-21 NOTE — Progress Notes (Signed)
Paramedicine Encounter   Patient ID: [name] , female,   DOB: 25-Sep-1961,59 y.o.,  MRN: [id number]   Met patient in clinic today with provider.  Weight @ clinic-213 B/p-134/60 P-88 Sp02-96 REDS CLIP-37%   Still smoking, dr Aundra Dubin is going to send in welbutrin to cut back those urges for her.   EKG done today- Keep on plavix until January.   -refills needed-  Clopidogrel Allopurinol Torsemide  Meds verified and 2 wks worth of pill boxes refilled.   Her PCP said she can go back to the 0.37DK of trulicity. Will see about getting that from the pharmacy since she just got the 1.9m refilled.   Her torsemide is being increased to 866mBID.  Wellbutrin being started.  Also potassium is being started 1 tablet daily.   Will f/u next week.    Karla StacksEMSaugerties South2/06/2021

## 2021-10-21 NOTE — Patient Instructions (Addendum)
EKG done today.  RedsClip done today.   No Labs done today.   INCREASE Toresmide to 80mg  (4 tablets) by mouth 2 times daily.   START Wellbutrin 150mg  (1 tablet) by mouth daily for 3 days THEN INCREASE to 150mg  (1 tablet) by mouth 2 times daily.   START Potassium 15meq( 1 tablet) by mouth daily.   No other medication changes were made. Please continue all current medications as prescribed.  Use your Cardiomems every other day.    Your physician recommends that you schedule a follow-up appointment in: 10 days for a lab only appointment and in 6 weeks with our NP/PA Clinic here in our office  If you have any questions or concerns before your next appointment please send [place] a message through Wise or call our office at [phone].    TO LEAVE A MESSAGE FOR THE NURSE SELECT OPTION 2, PLEASE LEAVE A MESSAGE INCLUDING: YOUR NAME DATE OF BIRTH CALL BACK NUMBER REASON FOR CALL**this is important as we prioritize the call backs  YOU WILL RECEIVE A CALL BACK THE SAME DAY AS LONG AS YOU CALL BEFORE 4:00 PM   Do the following things EVERYDAY: Weigh yourself in the morning before breakfast. Write it down and keep it in a log. Take your medicines as prescribed Eat low salt foods--Limit salt (sodium) to 2000 mg per day.  Stay as active as you can everyday Limit all fluids for the day to less than 2 liters   At the [facility], you and your health needs are our priority. As part of our continuing mission to provide you with exceptional heart care, we have created designated Provider Care Teams. These Care Teams include your primary Cardiologist (physician) and Advanced Practice Providers (APPs- Physician Assistants and Nurse Practitioners) who all work together to provide you with the care you need, when you need it.   You may see any of the following providers on your designated Care Team at your next follow up: Dr Glori Bickers Dr Haynes Kerns,  NP Lyda Jester, [place] Audry Riles, PharmD   Please be sure to bring in all your medications bottles to every appointment.

## 2021-10-21 NOTE — Progress Notes (Signed)
Patient ID: [name], female   DOB: 1961/09/30, 60 y.o.   MRN: [id number]    Advanced Heart Failure Clinic Note   PCP: [facility] PV: Dr. Gwenlyn Found HF Cardiology: Dr. Aundra Dubin  [name] is a 60 y.o. with history of PAD, carotid stenosis s/p left CEA, relatively mild CAD, chronic systolic CHF, paroxysmal atrial fibrillation and prior substance abuse presents for CHF clinic followup.  She was admitted in 1/17 with acute hypoxemic respiratory failure in the setting of atrial fibrillation/RVR and volume overload.  She was initially intubated.  She converted back to NSR with amiodarone gtt.  She was treated with IV Lasix, steroids, bronchodilators.  She developed AKI and losartan was stopped.    She was admitted in 3/17 with symptomatic bradycardia.  She was supposed to stop diltiazem after this admission but continued to take it.  She presented later in 3/17, again with symptomatic bradycardia (junctional bradycardia), hypotension, and AKI.  Diltiazem and bisoprolol were stopped.  HR recovered and she has had no problems since.  ACEI was stopped with AKI.   In 5/18, she had peripheral angiography with atherectomy of right SFA.  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 7/18 ABIs showed 0.65 on right, 0.31 on left.  In 8/20, she had peripheral angiography showing totally occluded left SFA.  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 Donnetta Hutching.  ABIs in 2/21 were 0.44 on right, 0.99 on left.   Echo in 1/21 showed EF 50%, mild LVH, normal RV, PASP 60 mmHg.   Echo 03/08/21 EF 55-60% with basal to mid inferolateral hypokinesis.   Admitted 6/22 for mechanical fall, found to have AKI on CKD. She was given IV lasix and her losartan was held.  Underwent PV angiogram with Dr. Gwenlyn Found 7/22. She had drug-coated balloon angioplasty of the right SFA after being admitted for critical ischemia of the right foot.  Repeat peripheral  arterial dopplers in 8/22 still showed severe right SFA disease.   Admitted 8/22 for bradycardia and AKI, bisoprolol subsequently stopped; amiodarone was continued.  Seen in ED 9/22 for N/V w/ black tarry stools after taking Pepto-Bismal, losartan stopped for unclear reasons.  She returns today for followup of CHF. She is still smoking 1/2 ppd.  She has a right foot ulcer, seeing wound clinic.  Not walking much due to ulcer.  Her right foot hurts.  No chest pain.  No dyspnea walking a short distance.  No orthopnea/PND.  Weight down 1 lb.    REDS clip 37%  ECG (personally reviewed): NSR, QTc 500 msec  Cardiomems: Last reading 10/14/21 showed PADP 18 (goal 20).  Labs (1/17): K 5.2, creatinine 1.4, AST 43, ALT normal Labs (2/17): K 4.2, creatinine 1.3, BNP 607, TSH normal Labs (3/17): K 4.2, creatinine 1.45, AST/ALT normal, HCT 28.7 Labs (4/17): K 4.4, creatinine 1.61 Labs (08/31/2016): K 4.5 Creatinine 1.43  Labs (11/17): K 4.2, creatinine 1.4 Labs (9/18): K 4.1, creatinine 1.1, TSH normal Labs 04/13/2018: K 5 Creatinine 1.75 Labs (2/21): K 4.1, creatinine 1.58 Labs (4/22): K 3.7, creatinine 1.55 Labs (5/22): K 3.8, creatinine 1.54 Labs (8/22): K 4.0, creatinine 2.07 Labs (11/22): TSH normal, LDL 59 Labs (12/22): K 3.6, creatinine 1.28, BNP 256, AST/ALT normal  PMH: 1. Carotid stenosis: Known occluded right carotid.  Left CEA in 4/16.  - Carotid dopplers (7/21): CTO RICA, mild disease LICA.  - Carotid dopplers (7/22): CTO RICA,  3-30% LICA.  2. CAD: LHC in 12/15 with 80% stenosis in small OM1, nonobstructive disease in other territories.  3. Chronic systolic CHF: Nonischemic cardiomyopathy (?due to ETOH or prior drug abuse).  She has a Cardiomems.  - Echo (1/17) with EF 45%, mild LV hypertrophy, moderate diastolic dysfunction, inferolateral severe hypokinesis, mildly decreased RV systolic function.  - Echo (1/18): EF 40-45%, mild LVH, normal RV size and systolic function.  - Echo  (1/21): EF 50%, mild LVH, normal RV, PASP 60 mmHg.  - Echo (4/22): EF 55-60%, basal-mid inferolateral hypokinesis with grade II diastolic dysfunction, RV normal, PASP 42 4. Atrial fibrillation: Paroxysmal.   5. Type II diabetes 6. CKD: Stage III. 7. COPD: Smokes 1 ppd.  8. 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  No longer drinks.  9. Hypothyroidism 10. PAD: Atherectomy SFA in 2014 (Dr Gwenlyn Found).  Peripheral arterial dopplers (2/17) with focal 75-99% proximal right SFA stenosis, occluded mid-distal right SFA, chronic occlusion of all runoff arteries on right. - In 5/18, she had peripheral angiography with atherectomy of right SFA.   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  - 7/18 ABIs showed 0.65 on right, 0.31 on left.   - Peripheral angiography 8/20: Totally occluded left SFA.  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 Donnetta Hutching.   - ABIs (2/21): 0.44 R, 0.99 L.  - Balloon angioplasty right SFA 7/22.  - ABIs (8/22): severe right SFA disease.  11. Anemia 12. Prior cocaine abuse.  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  14. OSA: Has not been able to tolerate CPAP.   SH: Lives with sister.  Prior cocaine abuse.  Prior ETOH abuse.  Quit smoking in 1/17, restarted, and quit again in 4/19, restarted again.  FH: CAD  Review of systems complete and found to be negative unless listed in HPI.    Current Outpatient Medications  Medication Sig Dispense Refill   ACCU-CHEK GUIDE test strip USE TO CHECK BLOOD SUGAR FOUR TIMES DAILY     acetaminophen (TYLENOL) 500 MG tablet Take 500 mg by mouth every 6 (six) hours as needed for moderate pain.     albuterol (VENTOLIN HFA) 108 (90 Base) MCG/ACT inhaler Inhale 2 puffs into the lungs every 6 (six) hours as needed for wheezing or shortness of breath. 18 g 3   allopurinol (ZYLOPRIM) 100 MG tablet Take 1 tablet (100 mg total) by mouth daily. 30 tablet 6    amiodarone (PACERONE) 200 MG tablet TAKE 1/2 TABLET (100 MG TOTAL) BY MOUTH DAILY. 15 tablet 3   apixaban (ELIQUIS) 5 MG TABS tablet Take 5 mg by mouth 2 (two) times daily.     atorvastatin (LIPITOR) 40 MG tablet Take 1 tablet (40 mg total) by mouth every evening. 90 tablet 1   benzonatate (TESSALON) 100 MG capsule Take 1 capsule (100 mg total) by mouth 2 (two) times daily as needed for cough. 40 capsule 0   budesonide-formoterol (SYMBICORT) 160-4.5 MCG/ACT inhaler Inhale 2 puffs into the lungs 2 (two) times daily. 1 Inhaler 2   buPROPion (WELLBUTRIN SR) 150 MG 12 hr tablet Take 1 tablet by mouth daily for 3 days then increase to 1 tablet by mouth 2 times daily. 60 tablet 2   clopidogrel (PLAVIX) 75 MG tablet Take 1 tablet (75 mg total) by mouth daily. 30 tablet 11   colchicine 0.6 MG tablet Take 2 tabs (1.2 mg) at  the onset of a gout flare, may take 1 tab (0.6 mg) in 1 hour if symptoms 30 tablet 3   Dulaglutide (TRULICITY) 1.5 RJ/1.8AC SOPN Inject 1.5 mg into the skin once a week. 2 mL 6   empagliflozin (JARDIANCE) 10 MG TABS tablet Take 1 tablet (10 mg total) by mouth daily before breakfast. 90 tablet 3   ferrous sulfate 324 MG TBEC Take 324 mg by mouth.     FLUoxetine (PROZAC) 20 MG capsule Take 1 capsule (20 mg total) by mouth at bedtime. 90 capsule 1   folic acid (FOLVITE) 1 MG tablet Take 1 tablet (1 mg total) by mouth daily. 90 tablet 1   hydrALAZINE (APRESOLINE) 50 MG tablet Take 1 tablet (50 mg total) by mouth 3 (three) times daily. 90 tablet 6   HYDROcodone-acetaminophen (NORCO/VICODIN) 5-325 MG tablet Take 1 tablet by mouth every 4 (four) hours as needed. 10 tablet 0   insulin glargine (LANTUS) 100 UNIT/ML Solostar Pen Inject 30 Units into the skin daily. 30 mL 6   insulin lispro (HUMALOG) 100 UNIT/ML KwikPen Inject 8 Units into the skin 3 (three) times daily with meals. 15 mL 0   Insulin Pen Needle 32G X 4 MM MISC USE AS DIRECTED WITH INSULIN PENS 100 each 0   levothyroxine  (SYNTHROID) 50 MCG tablet Take 1 tablet (50 mcg total) by mouth daily. 90 tablet 1   losartan (COZAAR) 25 MG tablet Take 2 tablets (50 mg total) by mouth daily. (Patient taking differently: Take 50 mg by mouth at bedtime.) 180 tablet 1   Omega-3 Fatty Acids (FISH OIL PO) Take 1 capsule by mouth daily.     ondansetron (ZOFRAN) 4 MG tablet Take 1 tablet (4 mg total) by mouth every 8 (eight) hours as needed for nausea or vomiting. 20 tablet 0   pantoprazole (PROTONIX) 40 MG tablet Take 1 tablet (40 mg total) by mouth daily. 90 tablet 0   potassium chloride SA (KLOR-CON M20) 20 MEQ tablet Take 1 tablet (20 mEq total) by mouth daily. 90 tablet 3   sulfamethoxazole-trimethoprim (BACTRIM DS) 800-160 MG tablet Take 1 tablet by mouth 2 (two) times daily for 7 days. 14 tablet 0   VITAMIN D PO Take 1 tablet by mouth daily.     amLODipine (NORVASC) 10 MG tablet Take 1 tablet (10 mg total) by mouth daily. 90 tablet 3   Continuous Blood Gluc Sensor (FREESTYLE LIBRE 2 SENSOR) MISC USE 1 (ONE) EACH EVERY 2 WEEKS (Patient not taking: Reported on 10/21/2021) 2 each 11   Misc. Devices MISC 2 L oxygen continuously via nasal cannula.  Diagnosis chronic respiratory failure with hypoxia (Patient not taking: Reported on 09/02/2021) 1 each 0   torsemide (DEMADEX) 20 MG tablet Take 4 tablets (80 mg total) by mouth 2 (two) times daily. 240 tablet 11   No current facility-administered medications for this encounter.   BP 134/60   Pulse 88   Wt 97 kg (213 lb 12.8 oz)   LMP  (LMP Unknown)   SpO2 96%   BMI 36.70 kg/m   Wt Readings from Last 3 Encounters:  10/21/21 97 kg (213 lb 12.8 oz)  10/19/21 95.7 kg (211 lb)  10/14/21 95.7 kg (211 lb)    PHYSICAL EXAM: General: NAD Neck: JVP 8-9 cm, no thyromegaly or thyroid nodule.  Lungs: Clear to auscultation bilaterally with normal respiratory effort. CV: Nondisplaced PMI.  Heart regular S1/S2, no S3/S4, no murmur. 1+ edema to knees.  No carotid bruit.  Unable to palpate  pedal pulses.  Abdomen: Soft, nontender, no hepatosplenomegaly, no distention.  Skin: Intact without lesions or rashes.  Neurologic: Alert and oriented x 3.  Psych: Normal affect. Extremities: No clubbing or cyanosis. Right foot ulcer.  HEENT: Normal.   Assessment/Plan: 1. Chronic diastolic CHF: LHC in 29/52 showing only 80% stenosis in small OM1. EF as low as 40-45% in the past. Echo 4/22 showed EF 55-60% with grade 2 diastolic dysfunction.   Not very active, NYHA class II-III symptoms. She is limited mostly by her foot pain.  On exam and by REDS clip, she is at least mildly volume overloaded.  - I encouraged her to send in Cardiomems reading more regularly.  - Continue losartan 50 mg daily. - Increase torsemide to 80 mg bid and add KCl 20 daily. BMET in 10 days.  - Continue Jardiance 10 mg daily. No GU symptoms. - Off bisoprolol due to bradycardia.   2. Atrial fibrillation: Paroxysmal. Episode of Afib w/ RVR during 07/29/21 ED visit, she spontaneously converted. She is NSR today on ECG. - Continue amiodarone 100 mg daily. Recent LFTs/ TFTs normal.  She is on levothyroxine, followed by PCP.  Needs regular eye exam,  - Continue Eliquis 5 mg bid.   3. PAD: Claudication bilaterally.  Not candidate for cilostazol with CHF.  She has had a left fem-pop bypass in 8/20.  ABIs in 2/21 were very abnormal on right. She underwent PV angiogram with balloon angioplasty of the right SFA 7/22.  8/22 dopplers still showed severe right SFA disease and she has a right foot ulceration.  - She has followup arranged with Dr. Gwenlyn Found.  - Continue atorvastatin. LDL at goal in 11/22.  - Continue with Wound Clinic.  - She is currently on Plavix along with Eliquis since 7/22 angioplasty, would plan to continue 6 months then stop (1/23).  4. COPD:  Continues to smoke, encouraged to quit.   - She is willing to try Wellbutrin for smoking cessation, I will prescribe.  5. H/o cirrhosis: Likely from ETOH, she has quit.  6.  OSA: Not able to tolerate CPAP.   7. CKD stage III:  Creatinine 1.28 on recent BMET.  8. HTN: Controlled.  9.Type 2 diabetes: Per PCP.   10. CAD: LHC in 12/15 with 80% stenosis in small OM1, nonobstructive disease in other territories. No chest pain. - Continue statin.  - Continue Eliquis.  Continue paramedicine. Followup in 6 wks with APP.   Loralie Champagne, MD 10/21/2021

## 2021-10-21 NOTE — Progress Notes (Signed)
ReDS Vest / Clip - 10/21/21 1400       ReDS Vest / Clip   Station Marker A    Ruler Value 29    ReDS Value Range Moderate volume overload    ReDS Actual Value 37

## 2021-10-25 IMAGING — CR DG ELBOW COMPLETE 3+V*R*
5 series · 5 of 5 positions shown · non-contrast
Comparison: None.

CLINICAL DATA: Fall

EXAM:
RIGHT ELBOW - COMPLETE 3+ VIEW

[x elbow lat right]
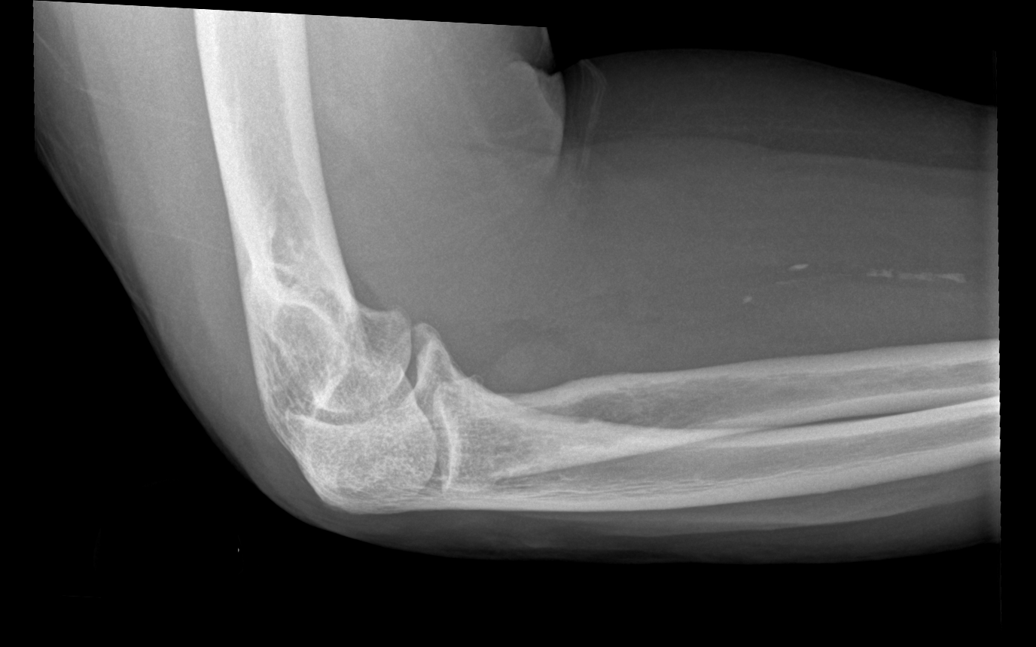

[x elbow obl right (1 of 2)]
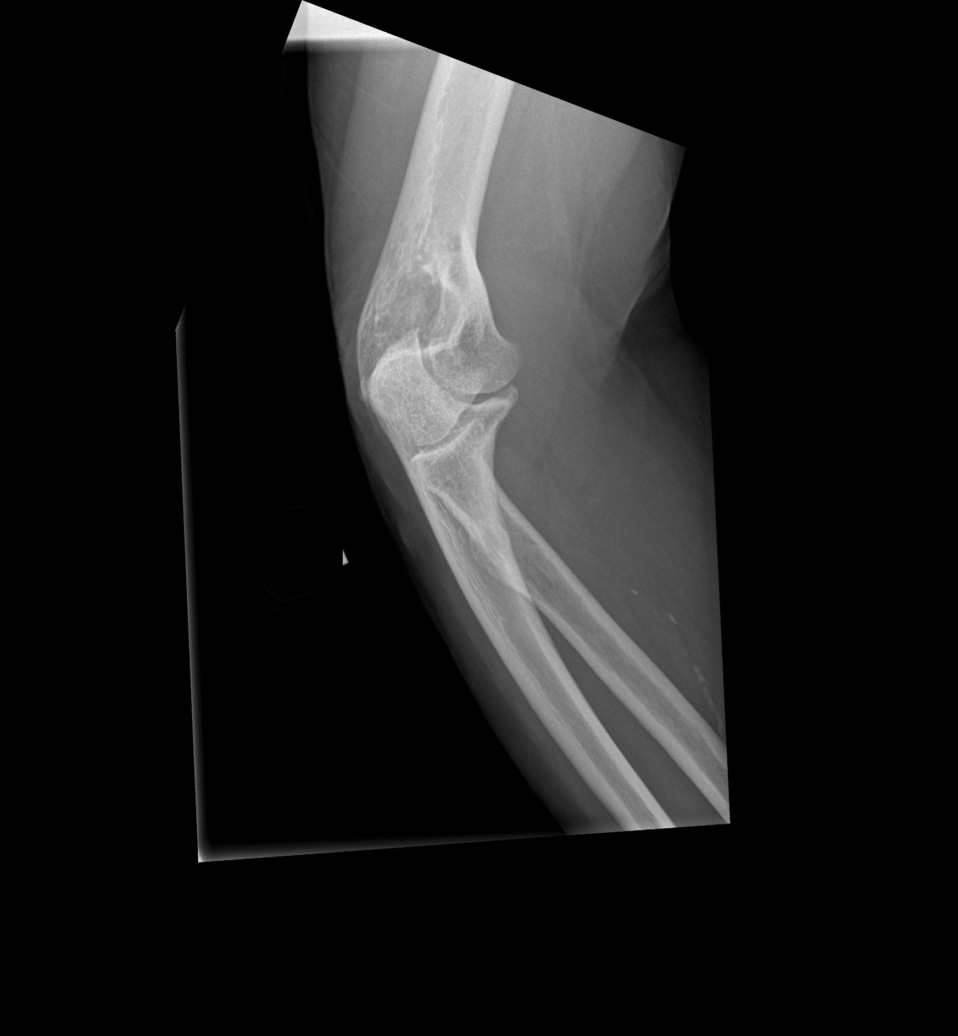

[x elbow ap right (1 of 2)]
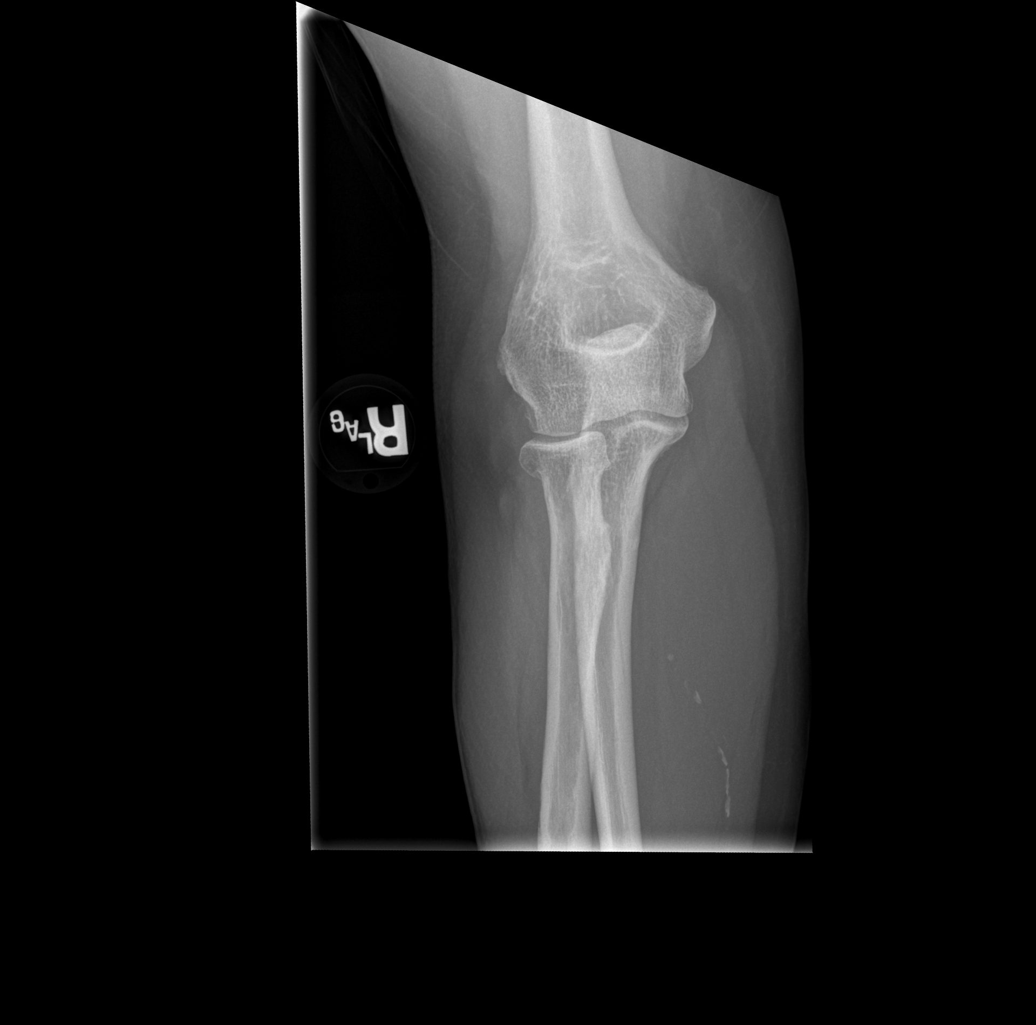

[x elbow ap right (2 of 2)]
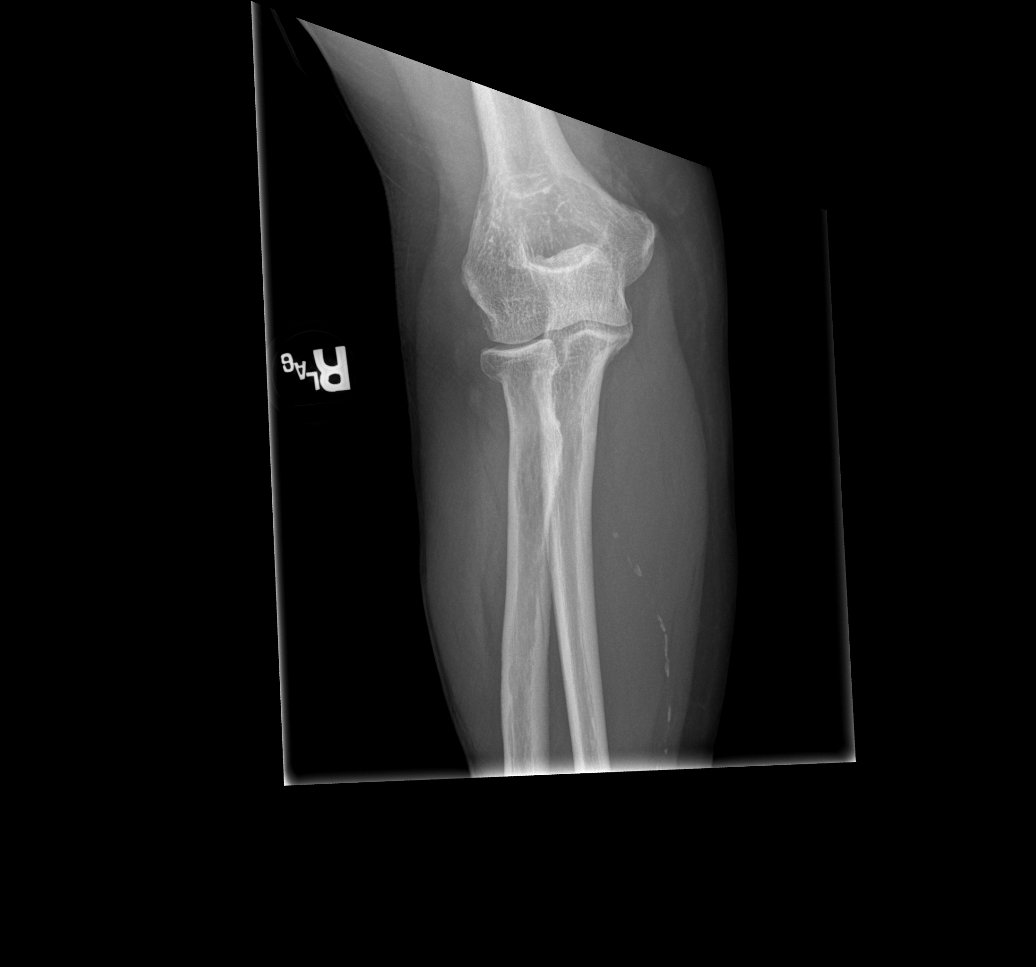

[x elbow obl right (2 of 2)]
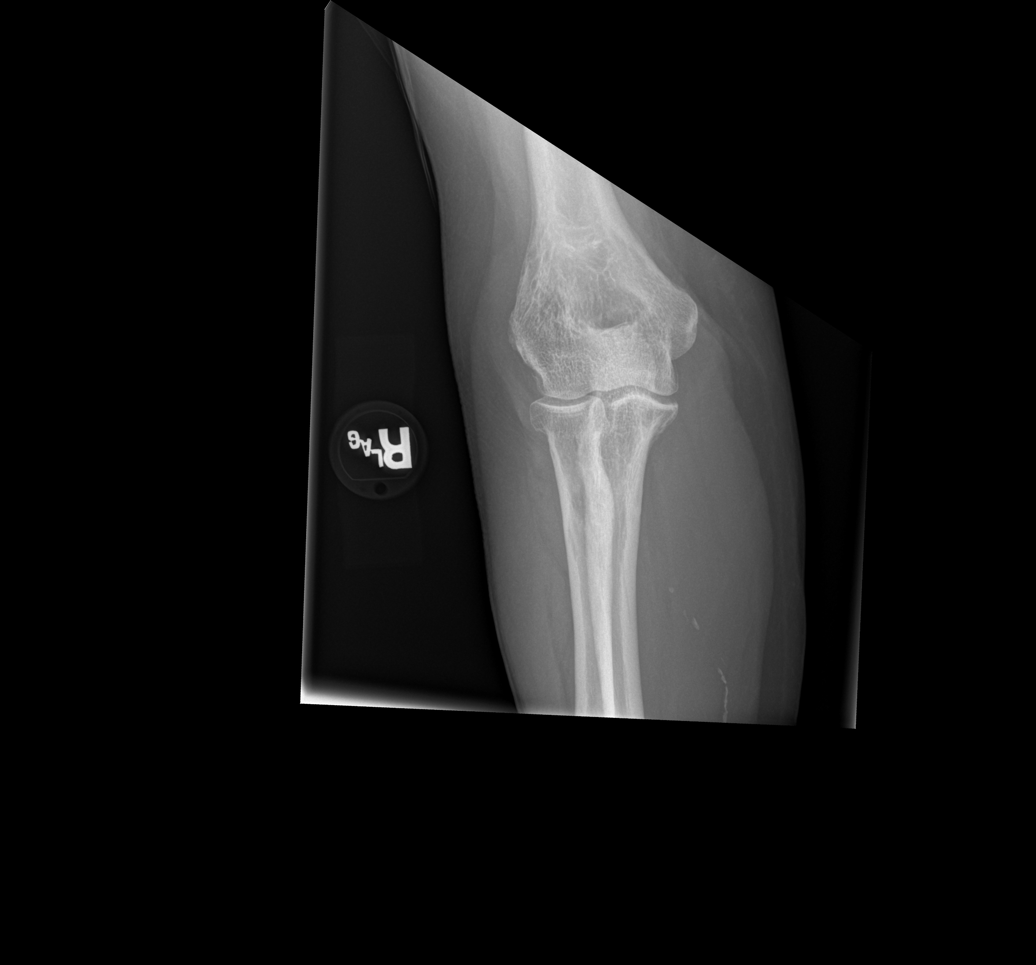

[5 of 5 positions shown; findings below may reference images not displayed]

FINDINGS: No fracture or dislocation at the right elbow. There is an old,
healed fracture of the radial head.
IMPRESSION: No acute fracture or dislocation of the right elbow.

## 2021-10-25 IMAGING — CR DG FOREARM 2V*R*
2 series · 2 of 2 positions shown · non-contrast
Comparison: 03/03/2020
COMPARISON: 03/03/2020

Addendum:
CLINICAL DATA: Fall

EXAM:
RIGHT FOREARM - 2 VIEW

[x forearm ap right]
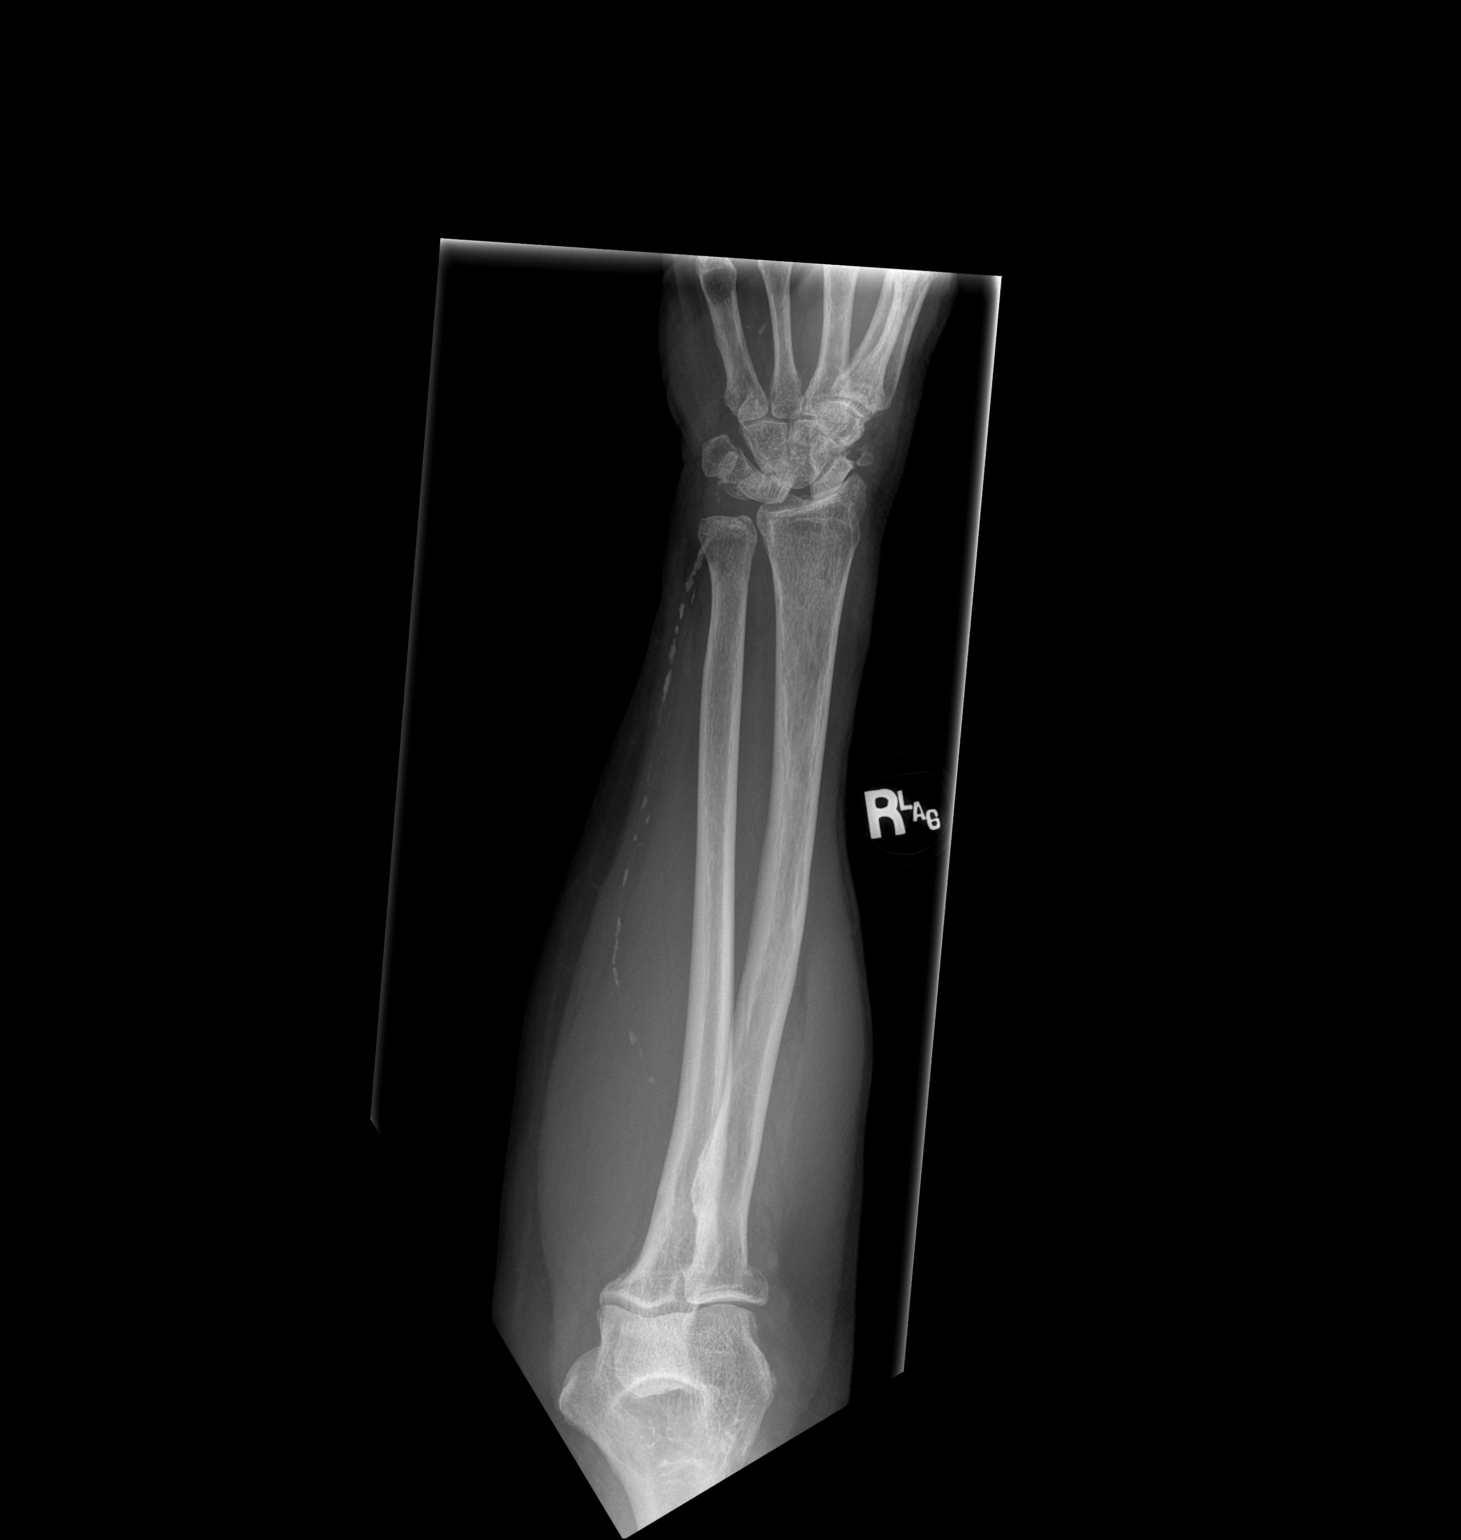

[x forearm lat right]
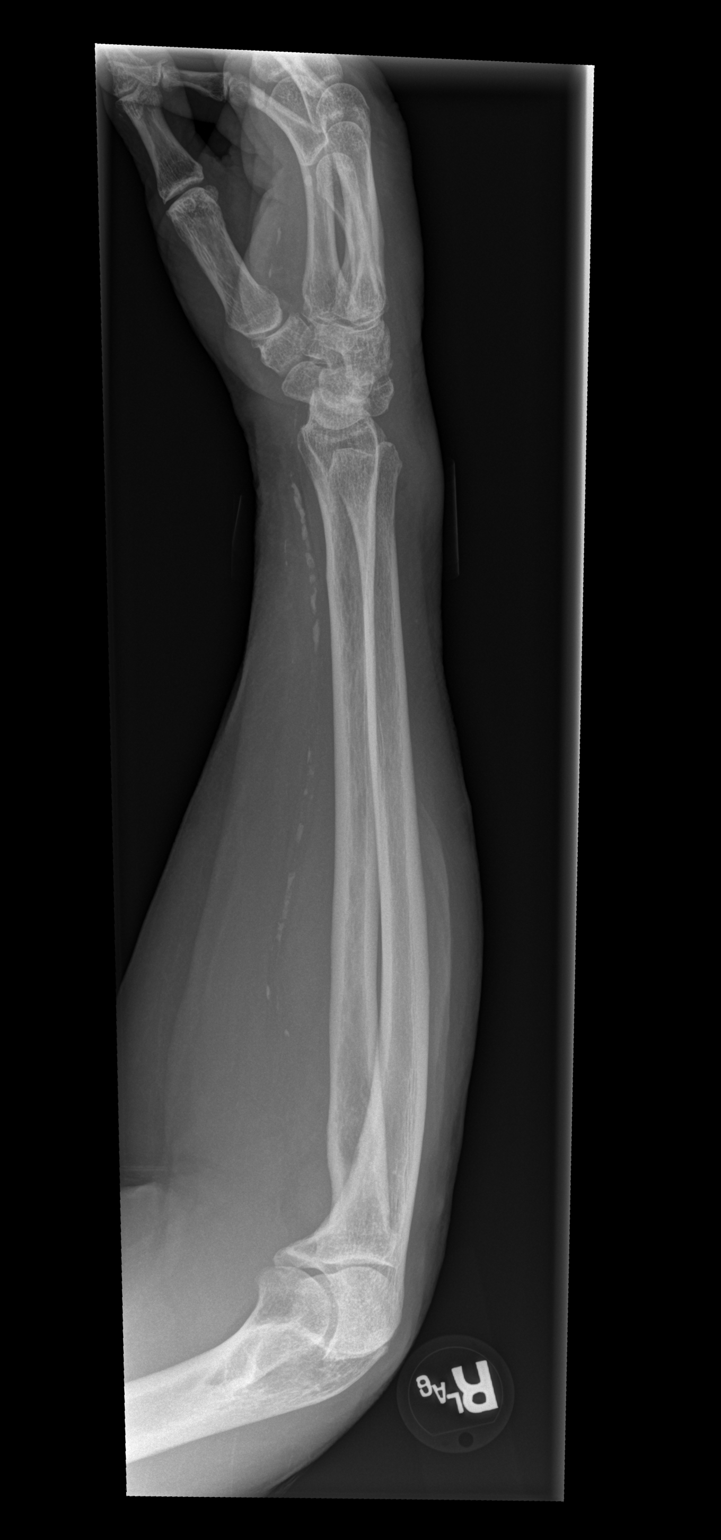

[2 of 2 positions shown; findings below may reference images not displayed]

FINDINGS: Remote posttraumatic deformity of the right radial head. No acute
abnormality.
IMPRESSION: Remote posttraumatic deformity of the right radial head. No acute
fracture or dislocation.

ADDENDUM:
Chronic widening of the scapholunate interval is unchanged since
03/03/2020. Increased fragmentation of laterally projecting scaphoid
osteophyte.

*** End of Addendum ***
FINDINGS: Remote posttraumatic deformity of the right radial head. No acute
abnormality.
IMPRESSION: Remote posttraumatic deformity of the right radial head. No acute
fracture or dislocation.

## 2021-10-25 IMAGING — CR DG TIBIA/FIBULA 2V*R*
4 series · 4 of 4 positions shown · non-contrast
Comparison: None.

CLINICAL DATA: Fall

EXAM:
RIGHT TIBIA AND FIBULA - 2 VIEW

[x tib-fib lat right (1 of 2)]
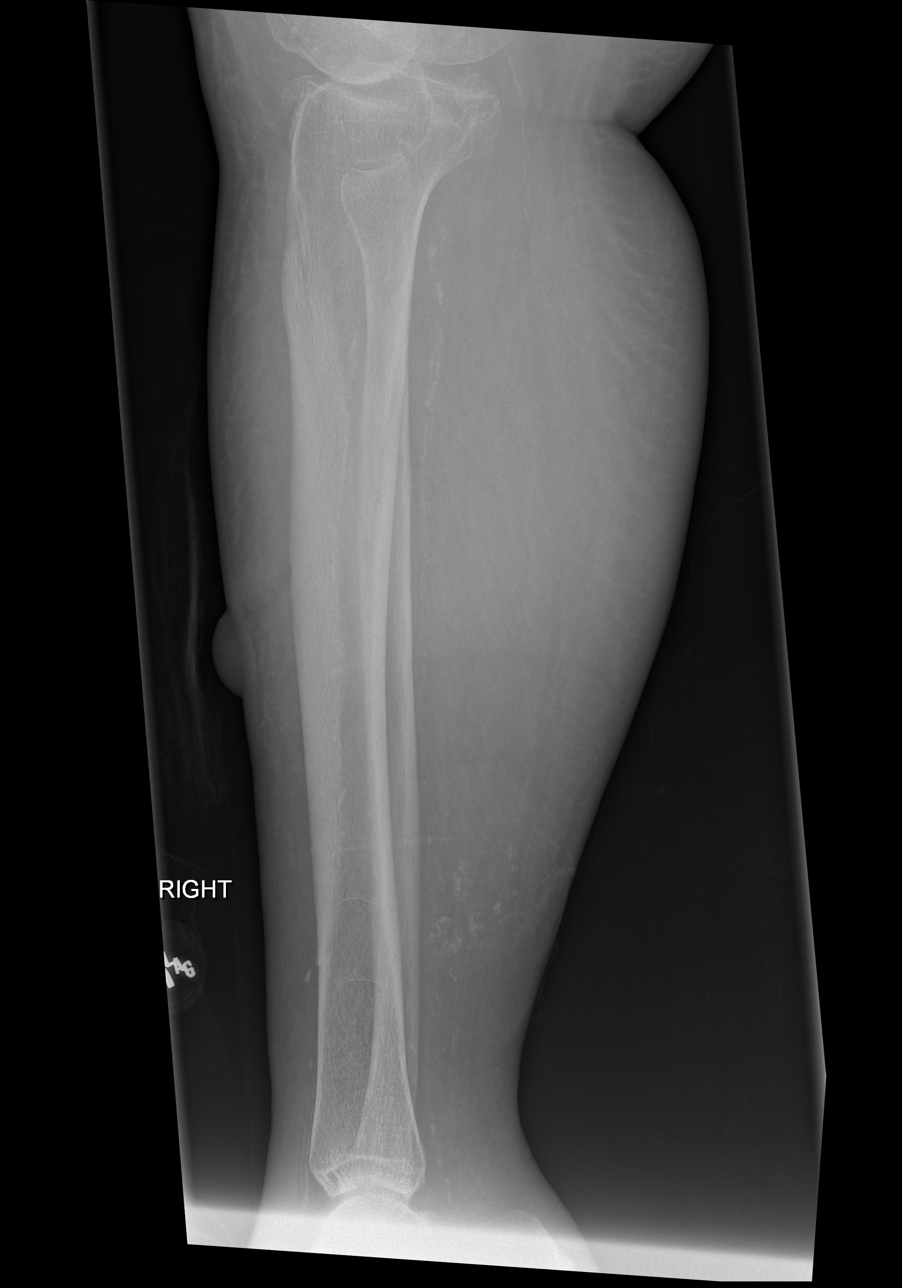

[x tib-fib lat right (2 of 2)]
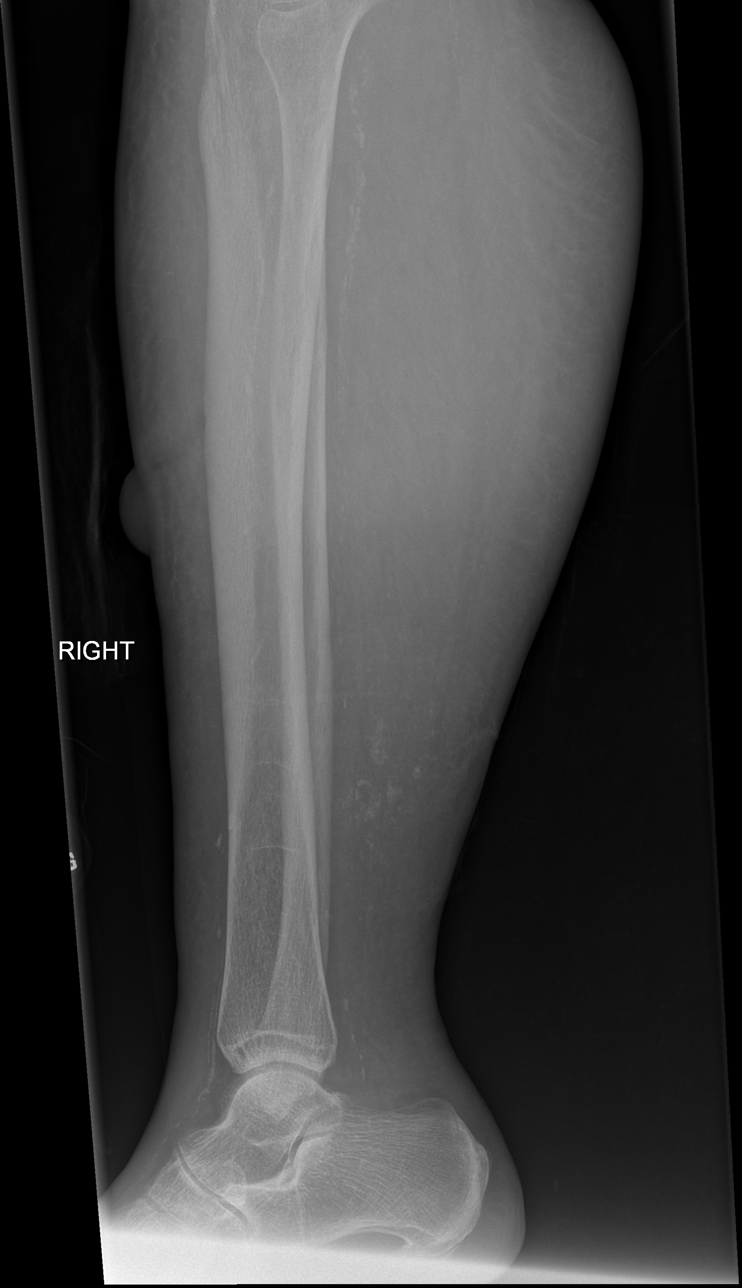

[x tib-fib ap right (1 of 2)]
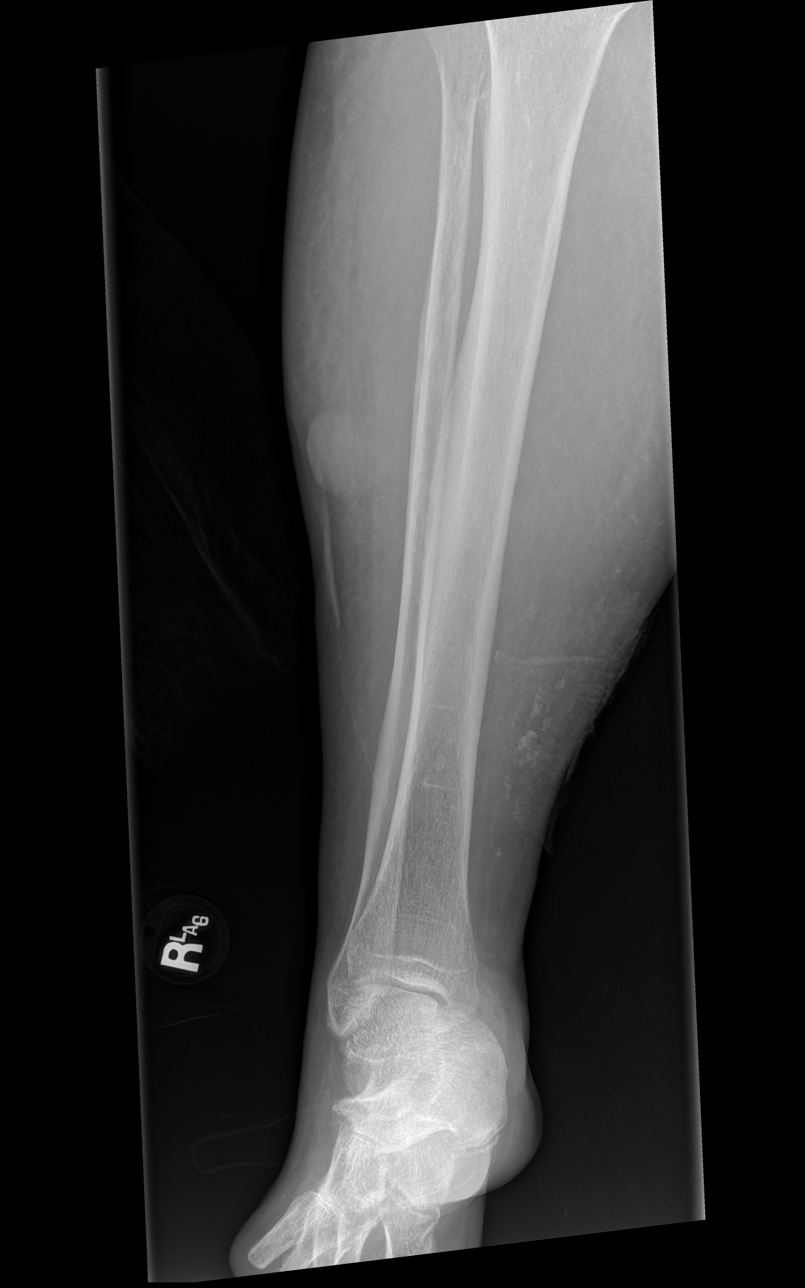

[x tib-fib ap right (2 of 2)]
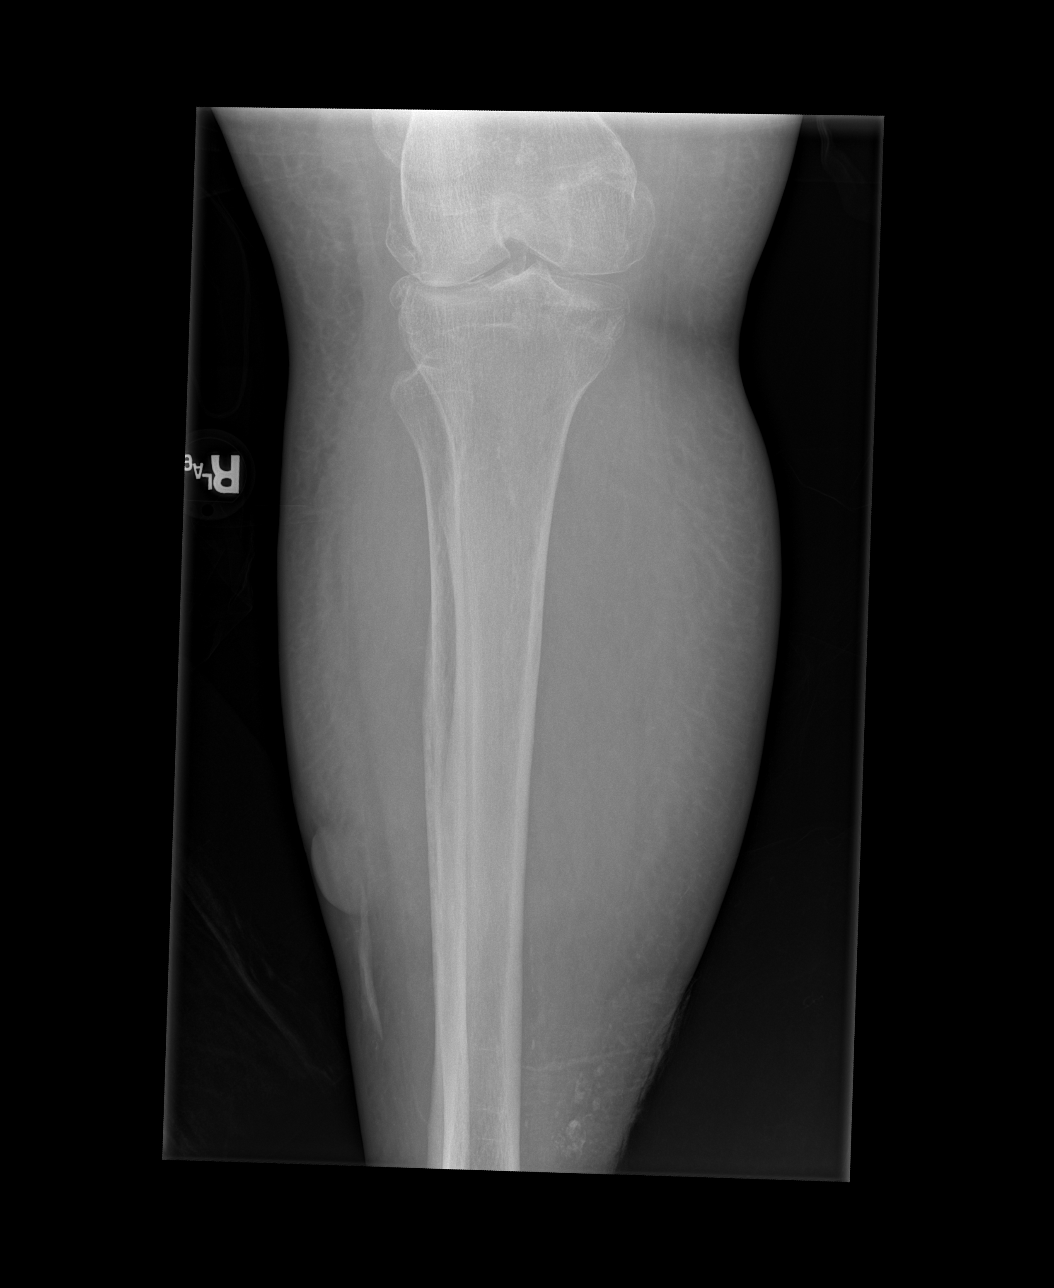

[4 of 4 positions shown; findings below may reference images not displayed]

FINDINGS: There is no evidence of fracture or other focal bone lesions. Soft
tissues are unremarkable.
IMPRESSION: Negative.

## 2021-10-25 IMAGING — CR DG SHOULDER 2+V*R*
3 series · 3 of 3 positions shown · non-contrast
Comparison: None.

CLINICAL DATA: Fall

EXAM:
RIGHT SHOULDER - 2+ VIEW

[x shoulder ap right (1 of 3)]
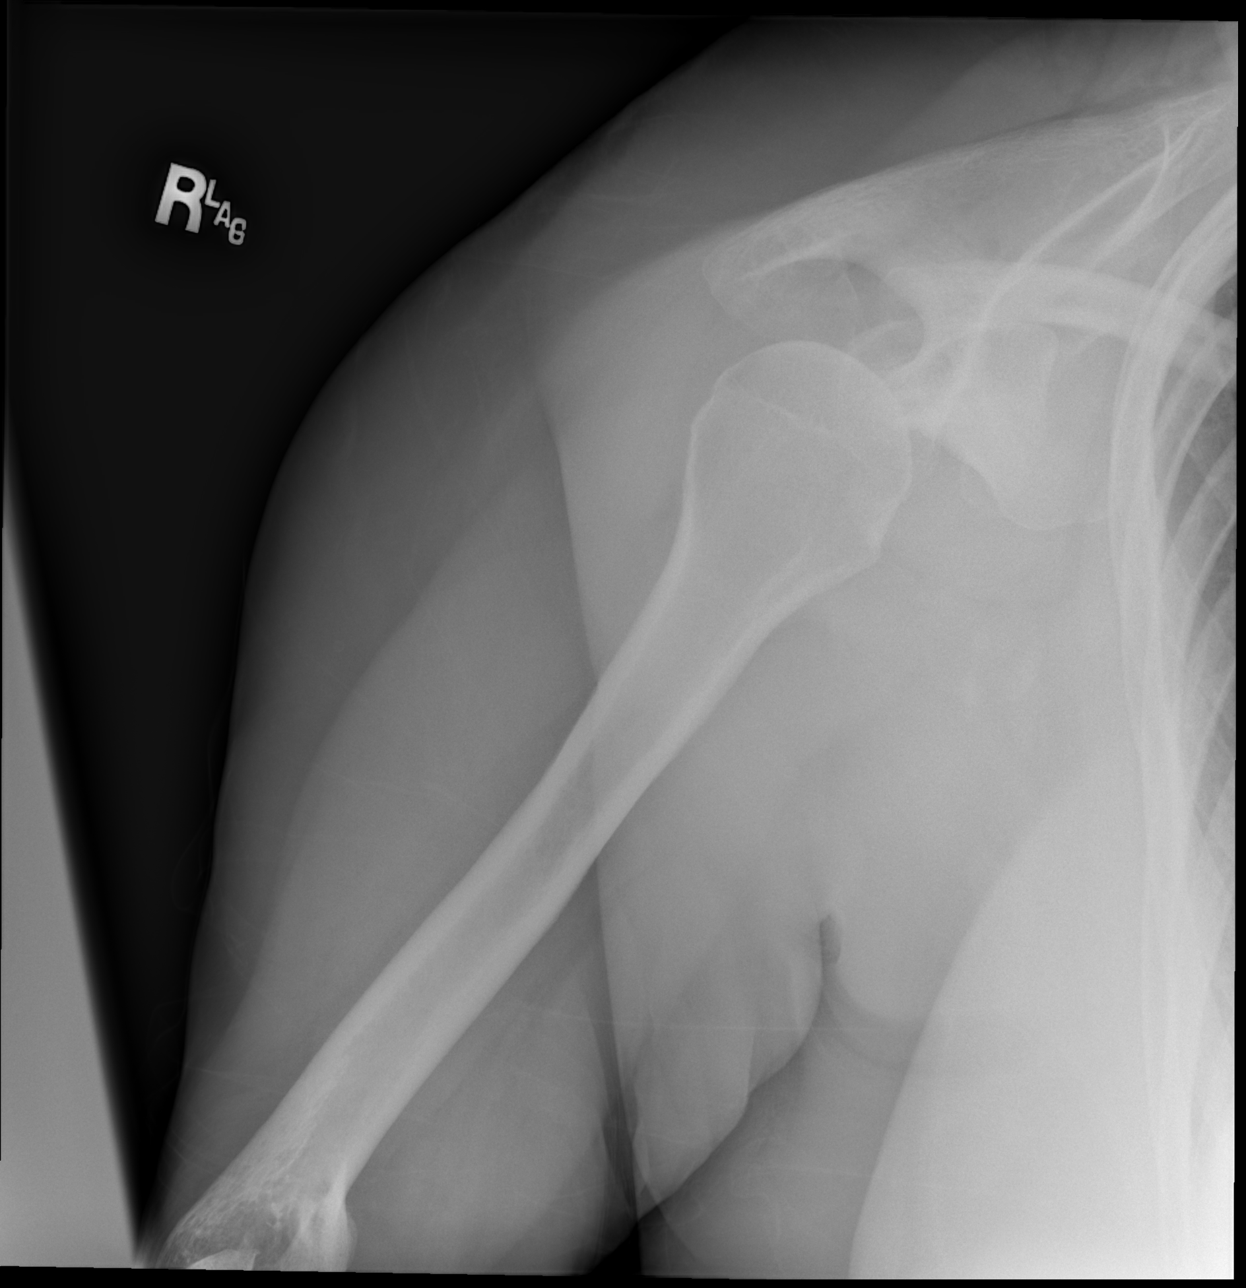

[x shoulder ap right (2 of 3)]
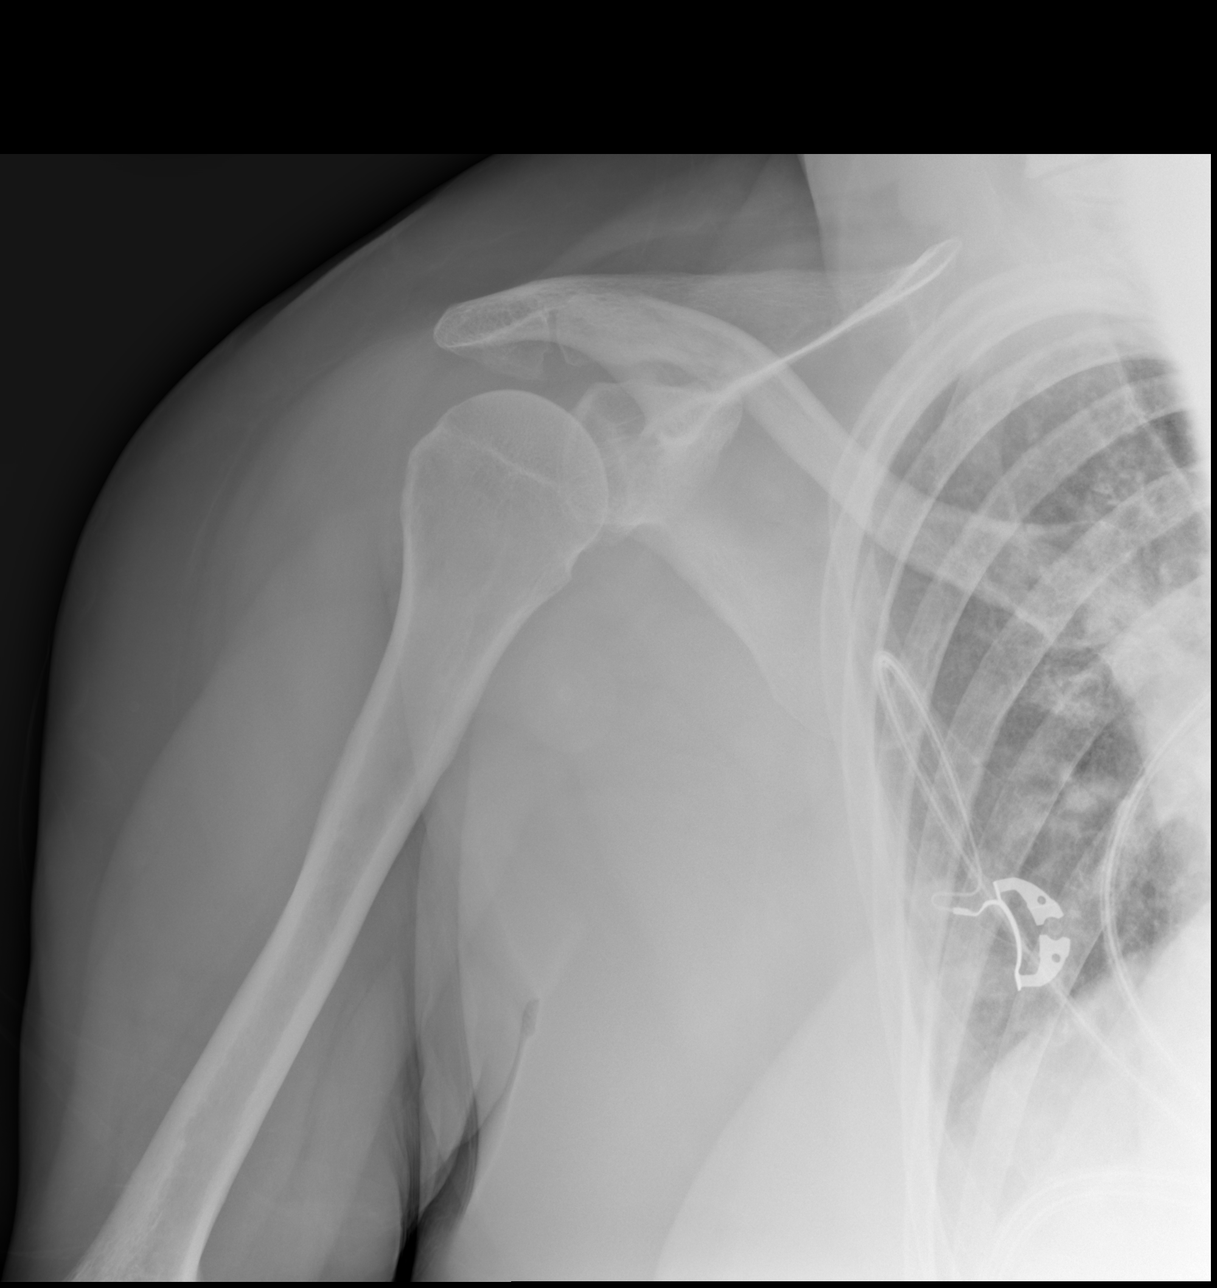

[x shoulder ap right (3 of 3)]
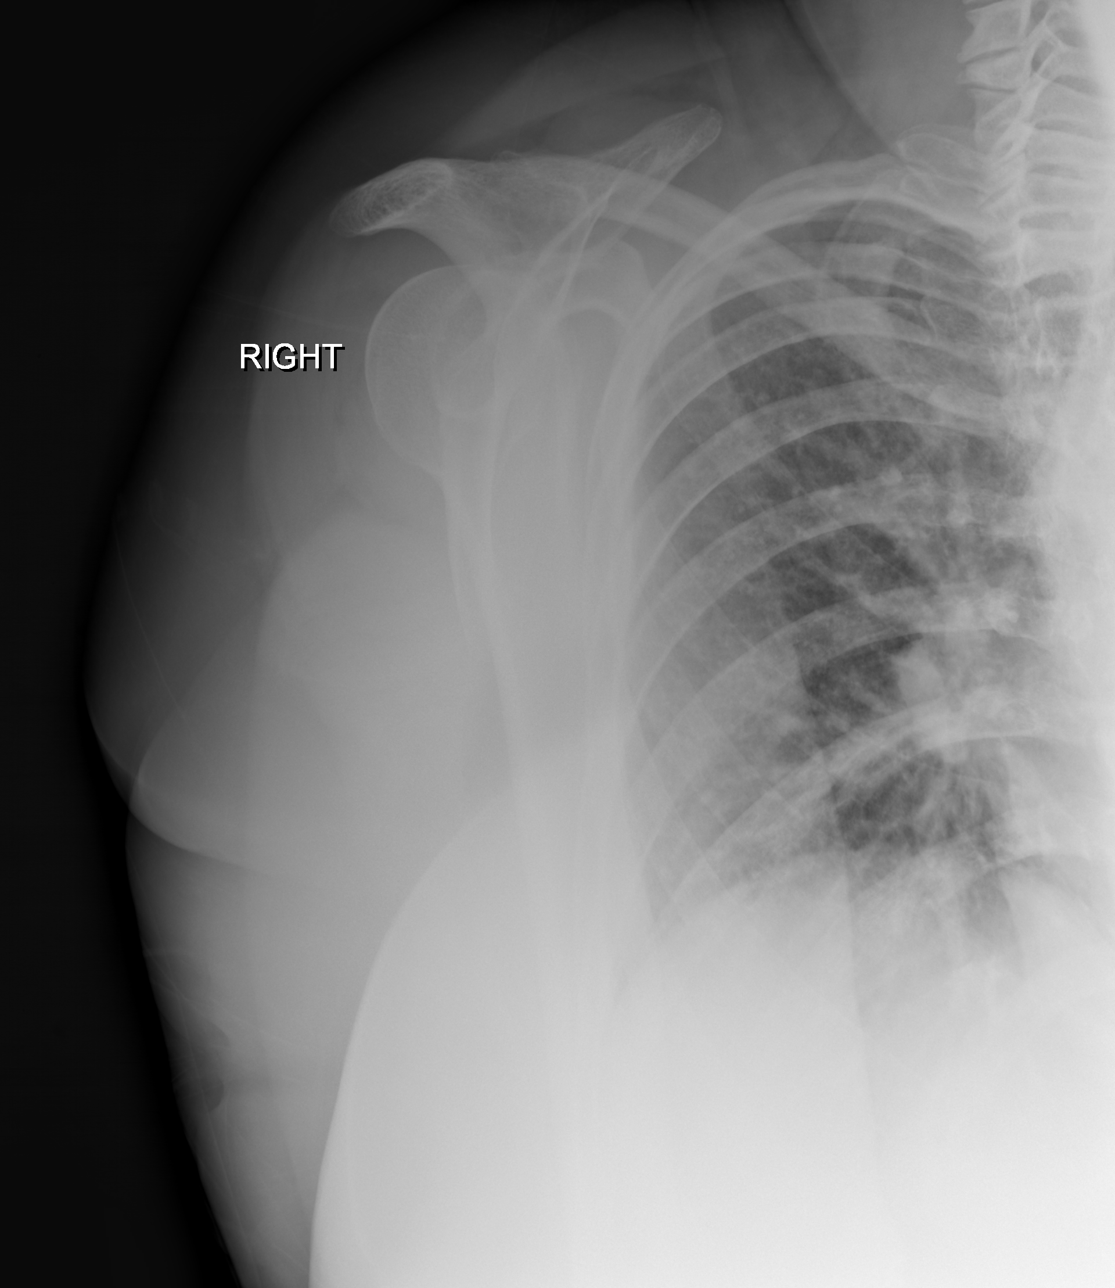

[3 of 3 positions shown; findings below may reference images not displayed]

FINDINGS: There is no evidence of fracture or dislocation. There is no
evidence of arthropathy or other focal bone abnormality. Soft
tissues are unremarkable.
IMPRESSION: Negative.

## 2021-10-25 IMAGING — CR DG HAND COMPLETE 3+V*R*
3 series · 3 of 3 positions shown · non-contrast
Comparison: None.

CLINICAL DATA: Trip and fall with medial hand pain, initial
encounter

EXAM:
RIGHT HAND - COMPLETE 3+ VIEW

[x hand pa right]
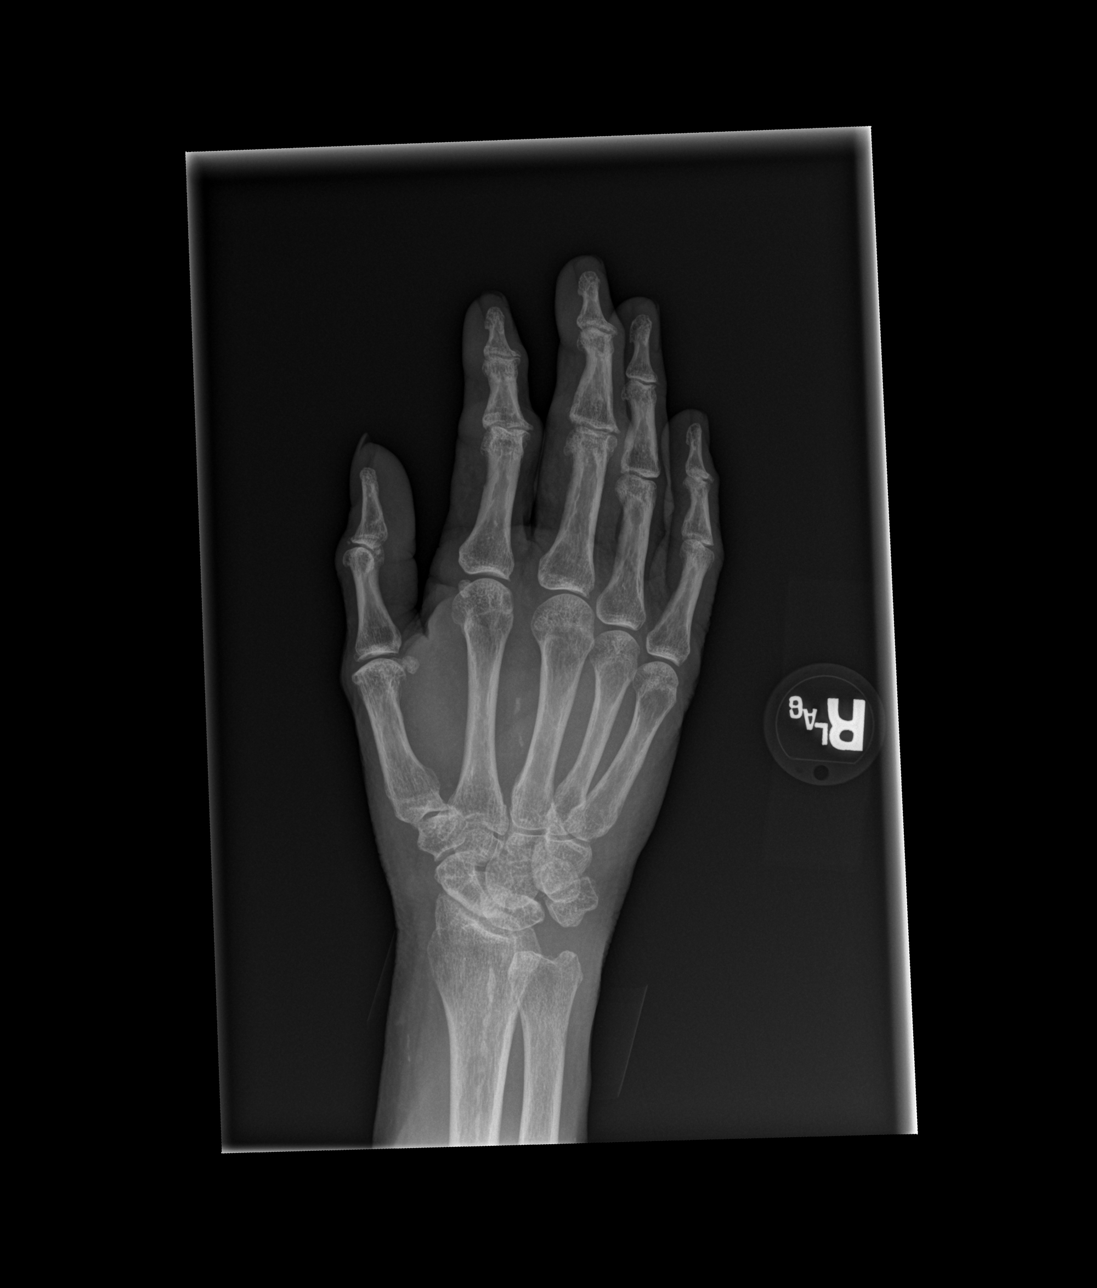

[x hand obl right]
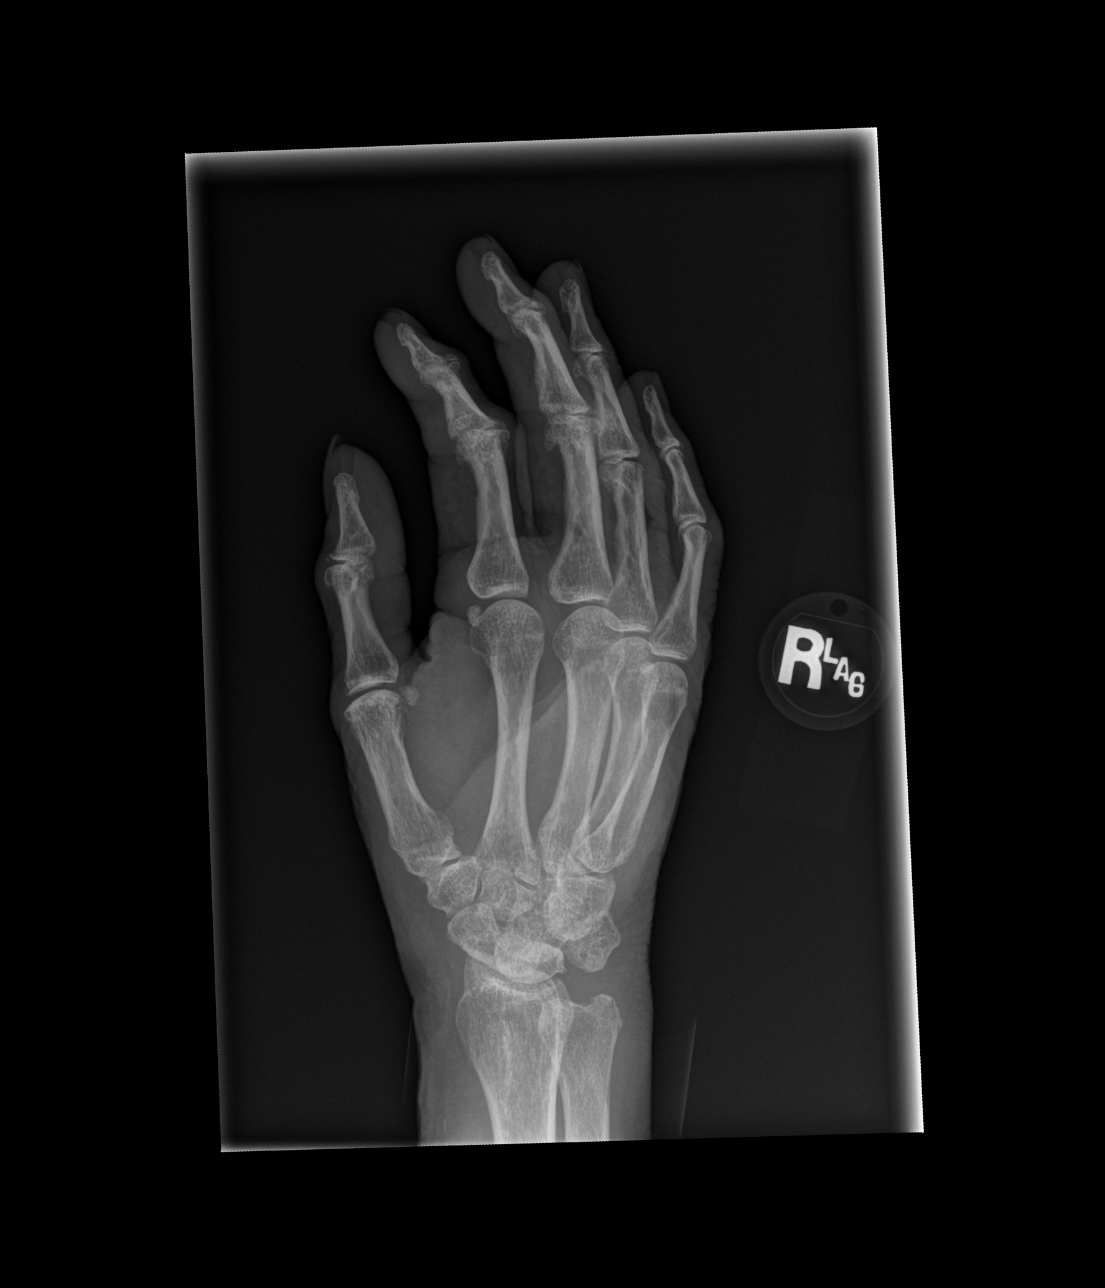

[x hand lat right]
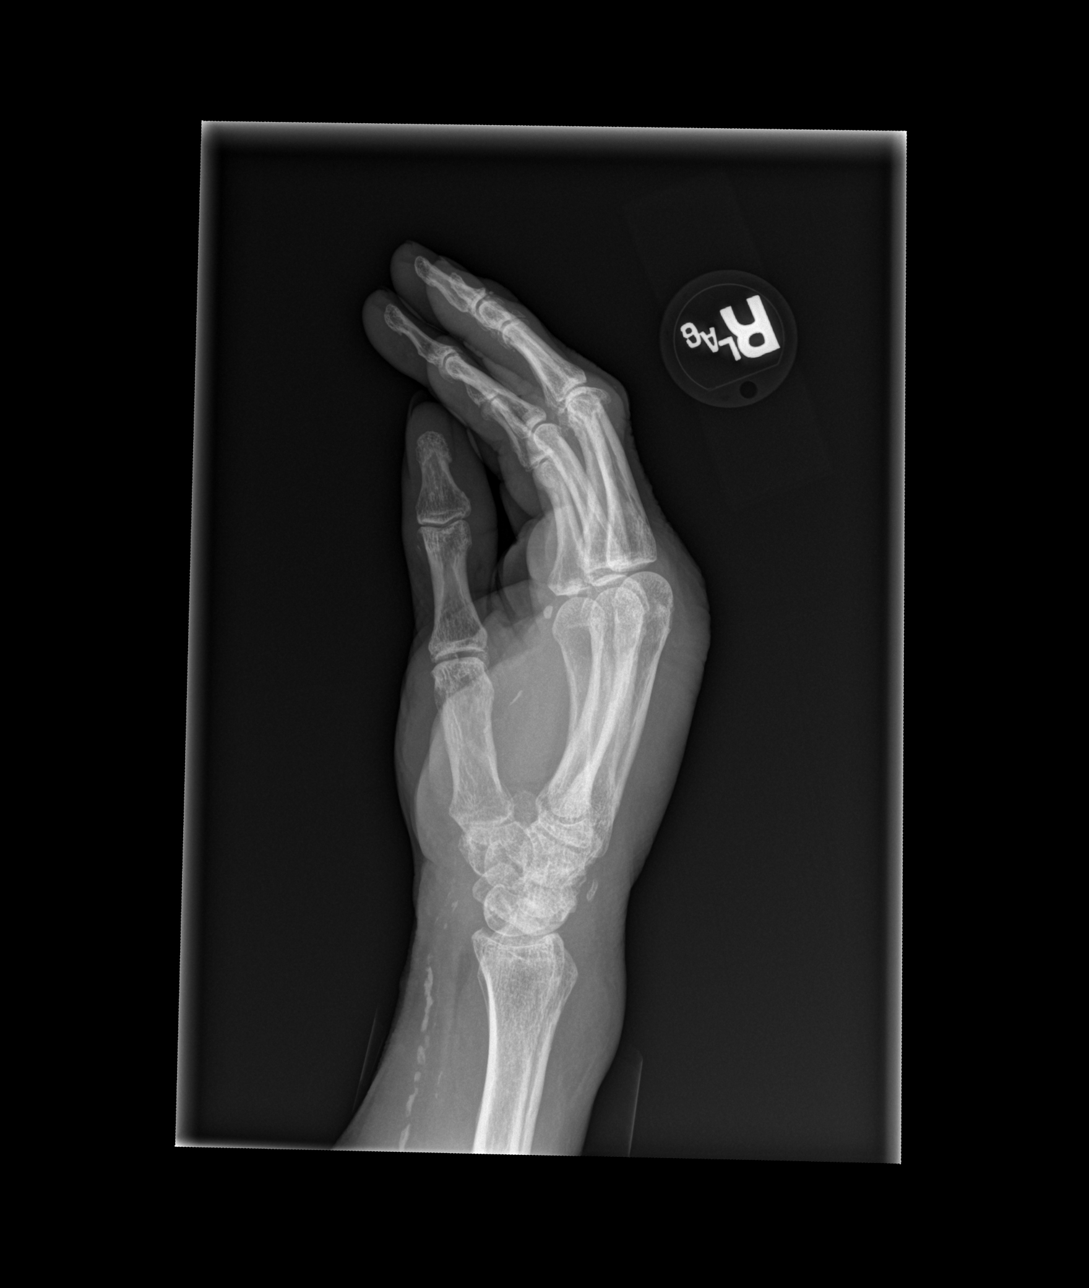

[3 of 3 positions shown; findings below may reference images not displayed]

FINDINGS: Mild degenerative changes are noted in the interphalangeal joints.
Mild cortical irregularity is noted in the mid to distal fifth
metacarpal suggestive of undisplaced fracture. No other focal
abnormality is seen. Vascular calcifications are noted.
IMPRESSION: Cortical irregularity in the mid to distal fifth metacarpal
suggestive of undisplaced fracture.

## 2021-10-26 ENCOUNTER — Other Ambulatory Visit: Payer: Self-pay

## 2021-10-26 ENCOUNTER — Encounter (HOSPITAL_BASED_OUTPATIENT_CLINIC_OR_DEPARTMENT_OTHER): Payer: Medicare Other | Admitting: Internal Medicine

## 2021-10-26 DIAGNOSIS — I872 Venous insufficiency (chronic) (peripheral): Secondary | ICD-10-CM | POA: Diagnosis not present

## 2021-10-26 DIAGNOSIS — L97518 Non-pressure chronic ulcer of other part of right foot with other specified severity: Secondary | ICD-10-CM | POA: Insufficient documentation

## 2021-10-26 DIAGNOSIS — E11621 Type 2 diabetes mellitus with foot ulcer: Secondary | ICD-10-CM | POA: Diagnosis present

## 2021-10-26 DIAGNOSIS — E1169 Type 2 diabetes mellitus with other specified complication: Secondary | ICD-10-CM | POA: Insufficient documentation

## 2021-10-26 DIAGNOSIS — F17210 Nicotine dependence, cigarettes, uncomplicated: Secondary | ICD-10-CM | POA: Diagnosis not present

## 2021-10-26 DIAGNOSIS — M86671 Other chronic osteomyelitis, right ankle and foot: Secondary | ICD-10-CM | POA: Diagnosis not present

## 2021-10-26 DIAGNOSIS — E1151 Type 2 diabetes mellitus with diabetic peripheral angiopathy without gangrene: Secondary | ICD-10-CM | POA: Insufficient documentation

## 2021-10-26 DIAGNOSIS — I130 Hypertensive heart and chronic kidney disease with heart failure and stage 1 through stage 4 chronic kidney disease, or unspecified chronic kidney disease: Secondary | ICD-10-CM | POA: Diagnosis not present

## 2021-10-26 DIAGNOSIS — N1832 Chronic kidney disease, stage 3b: Secondary | ICD-10-CM | POA: Diagnosis not present

## 2021-10-26 DIAGNOSIS — Z89422 Acquired absence of other left toe(s): Secondary | ICD-10-CM | POA: Diagnosis not present

## 2021-10-26 NOTE — Progress Notes (Signed)
[name], [name] ([id number]) Visit Report for 10/26/2021 HPI Details Patient Name: Date of Service: [name] 10/26/2021 1:45 PM Medical Record Number: [id number] Patient Account Number: [id number] Date of Birth/Sex: Treating RN: Oct 02, 1961 (60 y.o. [name] Primary Care Provider: Charlott Rakes Other Clinician: Referring Provider: Treating Provider/Extender: Sindy Guadeloupe Weeks in Treatment: 35 History of Present Illness HPI Description: This 60 year old patient who has a very long significant history of diabetes mellitus, previous alcohol and nicotine abuse, chronic data disease, COPD, diabetes mellitus, height hypertension, critical lower limb ischemia with several wounds being managed at the wound Center at [facility] for over a year. She was recently in the ER at [facility] and was referred to our center. He has had a long history of critical limb ischemia and over a period of time has had balloon angioplasties in March 2016, endarterectomy of the left carotid, lower extremity angiogram and treatment by Dr. Quay Burow, several cardiac catheterizations. Most recently she had an x-ray of her left foot while in the ER which showed no acute abnormality. during this ER visit she was started on ciprofloxacin and asked to continue with the wound care physicians at [facility]. Her last ABI done in June 2017 showed the right side to be 0.28 in the left side to be 0.48. Her right toe brachial index was 0.17 on the right and 0.27 on the left. her last hemoglobin A1c was 11.1% She continues to smoke about a pack of cigarettes a day. 03/28/2017 -- -- right foot x-ray -- IMPRESSION:Areas of soft tissue swelling. Mild subluxation second PIP joint. No frank dislocation or fracture. No erosive change or bony destruction. No soft tissue ulceration or radiopaque foreign body evident by radiography. There is plantar fascia calcification with a nearby  inferior calcaneal spur. There are foci of arterial vascular calcification. 04/04/2017 -- the patient continues to be noncompliant and continues to complain of a lot of pain and has not done anything about quitting smoking Readmission: 06/26/18 on evaluation today patient presents for reevaluation she has not been seen in our office since May 2018. Since she was last seen here in our office she has undergone testing at Dr. Elspeth Cho office where it was revealed that she had an ABI of 0.68 with her previous ABI being 0.64 and a left ABI of 0.38 with previous ABI being 0.34 this study which was performed on 04/05/18 was pretty much equivalent to the study performed on 11/22/17. The findings in the end revealed on the right would appear to be moderate right lower extremity arterial disease on the left Dr. Gwenlyn Found states moderate in the report but unfortunately this appears to be much more severe at 0.38 compared to the right. Subsequently the patient was scheduled to have angiography with Dr. Gwenlyn Found on 04/12/18. This was however canceled due to the fact that the patient was found to have chronic kidney disease stage III and it was to the point that he did not feel that it will be safe to pursue angiography at that point. She has not been on any antibiotics recently. At this point Dr. Gwenlyn Found has not rescheduled anything as far as the injured Vina according to the patient he is somewhat reluctant to do so. Nonetheless with her diminished blood flow this is gonna make it somewhat difficult for her to heal. 07/05/18; this is a patient I have not seen previously. She has very significant PAD as noted above. She apparently has had  revascularization efforts by Dr. Gwenlyn Found on the right on 3 occasions to the patient. She was supposed to have an attempted angiography on the left however this was canceled apparently because of stage III chronic renal failure. I will need to research all of this. She complains of  significant pain in the wound and has claudication enough that when she walks to the end of the driveway she has to stop. She is been using silver alginate to the wounds on her legs and Iodoflex to the area on her left second toe. 07/13/18 on evaluation today patient's wounds actually appear to be doing about the same. She has an appointment she tells me within the next month that is September 2019 with Dr. Gwenlyn Found to discuss options to see if there's anything else that you can recommend or do for her. Nonetheless obviously what we're trying to do is do what we can to save her leg and in turn prevent any additional worsening or damage. None in the meantime we been mainly trying to manage her ulcers as best we can. 07/27/18; some improvement in the multiplicity of wounds on her left lower extremity and foot. She's been using silver alginate. I was unable to determine that she actually has an appointment with Dr. Gwenlyn Found. We are checking into this The patient's wound includes Left lateral foot, left plantar heel, her left anterior calf wound looks close to me, left fourth toe is very close to closed and the left medial calf is perhaps the largest open area READMISSION 02/19/2021 This is a now 60 year old woman with type 2 diabetes and continued cigarette smoking. She has known PAD. She was last in this clinic in 2019 at that point with wounds on her left foot. She left in a nonhealed state. On 06/28/2019 I see she had a left femoropopliteal by vein and vascular with an amputation of the fifth ray. These managed to heal over. Her last arterial studies were in February 2021. This showed an absent waveform at the posterior tibial artery on the right a dampened monophasic dorsalis pedis on the right of 0.44. On the left again the PTA TA was absent her dorsalis pedis was 0.99 triphasic and her great toe pressure was 0.65. This would have been after her revascularization. Once again she tells me that Dr. Gwenlyn Found has  done revascularization on the right leg on 3 different occasions. She has a right transmetatarsal amputation apparently done by Dr. Sharol Given remotely but I do not see a note for these right lower extremity revascularization but I may not of looked back far enough. In any case she says that the she has a wound on the plantar aspect of the right transmetatarsal amputation site there is been there for several months. More recently she fell and had a wound on her right medial lower leg she had 5 sutures placed in the ER and then subsequently she has developed an area on the medial lower leg which was a blister that opened up. Past medical history includes type 2 diabetes, PAD, chronic renal failure, congestive heart failure, right transmetatarsal amputation, left fifth ray amputation, continued tobacco abuse, atrial fibrillation and cirrhosis. We did not attempt an ABI on the right leg today because of pain 02/26/2021; patient we admitted to the clinic last week. She has what looks to be 2 areas on her right medial and right anterior lower leg that look more like venous wounds but she has 1 on the first met head at the base of  her right transmet. She has known severe PAD. She complains of a lot of pain although some of this may be neuropathic. Is difficult to exclude a component of claudication. Use silver alginate on the wounds on her legs and Iodoflex on the area on the first met head. She has an appointment with Dr. Gwenlyn Found on 5/25 although I will text him and discussed the situation. She is have poorly controlled diabetic. She has has stage IIIb chronic renal failure 4/22; patient presents for 1 week follow-up. The 2 areas on her right medial and right anterior lower leg appear well-healing. She has been using silver alginate to this area without issues. She has a first met head ulcer and it is unclear how long this has been there as it was discovered in the ED earlier this month when she was being  evaluated for another issue. Iodoflex has been used at this area. 4/29; patient presents for 1 week follow-up. She has been using silver alginate to the leg wounds and Iodoflex to the plantar foot wound. She has had this wrapped with Coban and Kerlix. She has no concerns or complaints today. 5/6; this is a difficult wound on the right plantar foot transmit site. We are not making a lot of progress. She had 2 more venous looking wounds on the right medial and right anterior lower leg 1 of which is healed. Her appointment with Dr. Gwenlyn Found is on 5/25 5/16; she did not tolerate the offloading shoe we gave her in fact she had a fall with an abrasion on her right forearm she is now back in the modified small shoe which does not offload her foot properly. Her appoint with Dr. Gwenlyn Found is still on 5/25 we have been using Iodoflex. The area on the right anterior lower leg is healed 7/18; patient presents for follow-up however has not been seen in 2 months. She was last seen at the end of May. She had a fall at the end of June and was hospitalized. She has been unable to follow-up since then. For the wound she has only been keeping it covered with gauze. She is scheduled for an aortogram with lower extremity runoff at the end of July. 7/29; patient presents for 2-week follow-up. She has home health and they have been changing her dressings. She denies any issues and has no complaints today. She states she had a procedure where they opened up one of her vessels in her legs. She denies signs of infection. 8/5; patient presents for follow-up. She was recently in the hospital for bradycardia. She was noted to have cellulitis to the left lower extremity and Unna boot was placed due to blisters and increased swelling. She reports improvement to her left leg since being in the hospital and stability check her right plantar foot wound. She denies signs of infection. 8/23; since I last saw this patient a lot has happened.  She still has the area over the right first met head in the setting of previous transmetatarsal amputation. Firstly most importantly she underwent revascularization of her occluded right SFA. She underwent directional atherectomy followed by drug-coated balloon angioplasty. She has no named vessel below the knee hopefully the revascularization of the right SFA will improve collateral flow. It is not felt however that she has any endovascular options. She had follow-up arterial studies noninvasive on 8/9. These showed an ABI of 0.44 at the right PTA. Monophasic waveforms. On the left her great toe ABI was 0.68. Her follow-up arterial Doppler  showed a 50 to 74% stenosis in the proximal SFA and a 50 to 74% stenosis in the distal SFA mild to severe atherosclerosis noted throughout the extremity. Areas of shadowing plaque seen, unable to rule out higher grade stenosis she also had a fall apparently wearing a right foot forefoot off loader that we gave her. She therefore comes in in an ordinary running shoe today. Not certain if this is the fall that ended up in the hospital with bradycardia 9/6 area over the right first metatarsal head in the setting of her previous amputation and severe PAD. She is also a diabetic with known PAD status post attempt at revascularization. She is continued smoker Again the separation of visits in the clinic is somewhat disturbing if we are going to consider her for a total contact cast. She is not wearing anything to offload this stating it causes imbalance especially the forefoot off loader. She basically comes in in bedroom slippers She also had a fall this morning about an hour ago. She has a skin tear on the left dorsal forearm 9/13; areas over the right first metatarsal head in the setting of previous TMA and severe PAD. Again she comes in here in slippers. We have been using Hydrofera Blue. We use MolecuLight on this which was essentially negative study 9/20; right  first metatarsal head in the setting of her previous TMA and severe PAD. Same slipper type shoes. She says she cannot wear a forefoot off loader and for some reason she will not wear surgical shoes. She says she is smoking half a pack per day. 9/30; right first metatarsal head. Absolutely no improvement in fact there is tunneling superiorly very close to bone. She does not offload this properly at all. We use MolecuLight on this 2 weeks ago that did not show any surface bacteria we have been using silver collagen is a dressing She tells me she is down to 3 cigarettes a day I have offered encouragement and a treatment plan 10/6; right first metatarsal head. Exposed bone this week which is not surprising. We have been using silver collagen. She is smoking 5 to 10 cigarettes a day 10/17; she has not been here in 10 days. The bone scraping that I did on 10/6 showed staph lugdunensis, staph epidermidis and Pseudomonas Alcalifenes. I put in for Septra DS 1 p.o. twice daily for 14 days last week would be but we could not reach her to start the antibiotic. Quinolones would have been a good alternative except she has multiple drug interactions. Septra would not cover what ever the Pseudomonas is but I am not really sure of the relevance here. I am going to try her on the Septra for 2 weeks. She has a probing wound on the right TMA that probes to bone. She claims to have just about stop smoking I reviewed her arterial status. She underwent a left fem-tib bypass by Dr. Donnetta Hutching in 06/28/2019. Her last angiogram was in July of this year by Dr. Gwenlyn Found. This showed an occluded right SFA the popliteal and tibial vessels were also occluded the peroneal vessel filled by collaterals and filled the PT and DP by communicating collaterals. She was not felt to have any additional and vascular options. Dr. Gwenlyn Found noted that she he had previously revascularized her right SFA 3 times. 10/25; she is tolerating the Septra but says it  makes her nauseated. I am going to continue this for an additional 2 weeks perhaps for a total of 6  weeks. Her wound does not look too much different. There is on the right first met metatarsal head. There is probing areas around the tissue that is in the middle of the wound. There is no purulence I cannot feel bone. The original source was for a bone scraping. 11/8; right first metatarsal head. This does not probe to bone and there is no purulence. As far as I can tell she is completed the Septra although there were problems with exact length of time and I think compliance. In any case I gave her 6 weeks. I have reviewed her PAD above. 11/15; right first metatarsal head. Again protruding subcutaneous tissue but this does not have any adherence to surrounding skin. Undermining circumferentially. There is no palpable bone. Not grossly purulent. We have been using Iodoflex to try to get some adherence to clean off the surface but no real improvement. I do not think they actually had enough supplies listening to the patient. She completed the 6 weeks of Septra I gave her, but I am not sure about the compliance with the dates i.e. may have missed days taken 1 a day etc. The patient has a vascular follow-up with Dr. Gwenlyn Found this coming Friday i.e. in 3 days. By my read of arterial evaluations I do not think she is felt to be a candidate for any further revascularization. The last angiogram was in July. 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 She continues to smoke now up to 10 cigarettes a day. She is not in any pain. Wound has not improved but is certainly not worsened. I gave her 6 weeks of trimethoprim/sulfamethoxazole which she is completed in some fashion. 11/29 right first metatarsal head previous TMA. Wound actually looks somewhat better today still a lot of callus around the wound on debris on the wound surface. UNFORTUNATELY the patient missed her appointment with Dr. Gwenlyn Found and  is not rebooked apparently until sometime in February 12/13; right first metatarsal wound actually looks some smaller today. I did not debride this today. She is using Iodoflex. When she came in last week she had a large abscess on her left anterior lower leg apparently after falling we send her to the ER to have this drained which was done. I cannot see any cultures x-ray was negative. She was apparently given 2 doses of IV antibiotics and discharged on Bactrim DS twice daily which she is still taking. As mentioned I cannot see any cultures. Electronic Signature(s) Signed: 10/26/2021 4:30:32 PM By: Linton Ham MD Signed: 10/26/2021 4:30:32 PM By: Linton Ham MD Entered By: Linton Ham on 10/26/2021 14:20:02 -------------------------------------------------------------------------------- Incision and Drainage Details Patient Name: Date of Service: [name], [name] 10/26/2021 1:45 PM Medical Record Number: [id number] Patient Account Number: [id number] Date of Birth/Sex: Treating RN: [birth year] (60 y.o. [name] Primary Care Provider: Charlott Rakes Other Clinician: Referring Provider: Treating Provider/Extender: Sindy Guadeloupe Weeks in Treatment: 35 Incision And Drainage Performed Wound #22 Left, Anterior Lower Leg for: Performed By: Physician Ricard Dillon., MD Incision And Drainage Type: Abscess Location: Left Anterior Leg Level of Consciousness (Pre- Awake and Alert procedure): Pre-procedure Verification/Time Out Yes - 14:12 Taken: Drainage Of: Purulent Instrument: Blade Bleeding: Moderate Hemostasis Achieved: Pressure Culture Sent: Swab Response to Treatment: Procedure was tolerated well Level of Consciousness (Post- Awake and Alert procedure): Post Procedure Diagnosis Same as Pre-procedure Electronic Signature(s) Signed: 10/26/2021 4:30:32 PM By: Linton Ham MD Entered By: Linton Ham on 10/26/2021  14:20:20 --------------------------------------------------------------------------------  Physical Exam Details Patient Name: Date of Service: [name] 10/26/2021 1:45 PM Medical Record Number: [id number] Patient Account Number: [id number] Date of Birth/Sex: Treating RN: 1960/12/25 (60 y.o. [name] Primary Care Provider: Charlott Rakes Other Clinician: Referring Provider: Treating Provider/Extender: Sindy Guadeloupe Weeks in Treatment: 35 Constitutional Patient is hypertensive.. Pulse regular and within target range for patient.[name] Respirations regular, non-labored and within target range.. Temperature is normal and within the target range for the patient.[name] Appears in no distress. Notes Wound exam; right first metatarsal head this looks somewhat better. No debridement I will see how this looks next week. Recurrent abscess left anterior upper tibia. I used a #15 scalpel to open and drain this. Copious amounts of purulent material including the core of the abscess. Specimen for culture obtained Electronic Signature(s) Signed: 10/26/2021 4:30:32 PM By: Linton Ham MD Entered By: Linton Ham on 10/26/2021 14:22:53 -------------------------------------------------------------------------------- Physician Orders Details Patient Name: Date of Service: [name], [name] 10/26/2021 1:45 PM Medical Record Number: [id number] Patient Account Number: [id number] Date of Birth/Sex: Treating RN: [birth year] (60 y.o. 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 - Dr. Dellia Nims Return A Bathing/ Shower/ Hygiene May shower with protection but do not get wound dressing(s) wet. Edema Control - Lymphedema / SCD / Other Elevate legs to the level of the heart or above for 30 minutes daily and/or when sitting, a frequency of: - throughout the day Avoid standing for long periods of time. Patient to wear own compression stockings every day. - left leg Exercise regularly Off-Loading Open toe surgical shoe to: - with felt padding to offload Additional Orders / Instructions Stop/Decrease Smoking Follow Nutritious Diet Wound Treatment Wound #16 - Amputation Site - Transmetatarsal Wound Laterality: Plantar, Right Cleanser: Soap and Water 1 x Per Week/7 Days Discharge Instructions: May shower and wash wound with dial antibacterial soap and water prior to dressing change. Cleanser: Wound Cleanser 1 x Per Week/7 Days Discharge Instructions: Cleanse the wound with wound cleanser or normal saline prior to applying a clean dressing using gauze sponges, not tissue or cotton balls. Peri-Wound Care: Sween Lotion (Moisturizing lotion) (Home Health) 1 x Per Week/7 Days Discharge Instructions: Apply moisturizing lotion as directed Prim Dressing: IODOFLEX 0.9% Cadexomer Iodine Pad 4x6 cm 1 x Per Week/7 Days ary Discharge Instructions: Apply to wound bed as instructed Secondary Dressing: Woven Gauze Sponge, Non-Sterile 4x4 in 1 x Per Week/7 Days Discharge Instructions: Apply over primary dressing as directed. Secondary Dressing: ABD Pad, 5x9 1 x Per Week/7 Days Discharge Instructions: Apply over primary dressing as directed. Secondary Dressing: Optifoam Non-Adhesive Dressing, 4x4 in 1 x Per Week/7 Days Discharge Instructions: Foam  donut to help offload Compression Wrap: Kerlix Roll 4.5x3.1 (in/yd) 1 x Per Week/7 Days Discharge Instructions: Apply Kerlix and Coban compression as directed. Compression Wrap: Coban Self-Adherent Wrap 4x5 (in/yd) 1 x Per Week/7 Days Discharge Instructions: Apply over Kerlix as directed. Wound #22 - Lower Leg Wound Laterality: Left, Anterior Cleanser: Soap and  Water 1 x Per Day/15 Days Discharge Instructions: May shower and wash wound with dial antibacterial soap and water prior to dressing change. Topical: Antibiotic Ointment 1 x Per Day/15 Days Prim Dressing: KerraCel Ag Gelling Fiber Dressing, 2x2 in (silver alginate) 1 x Per Day/15 Days ary Discharge Instructions: Apply silver alginate to wound bed as instructed Secondary Dressing: Woven Gauze Sponge, Non-Sterile 4x4 in 1 x Per Day/15 Days Discharge Instructions: Apply over primary dressing as directed. Secured With: 53M Medipore H Soft Cloth Surgical T ape, 4 x 10 (in/yd) 1 x Per Day/15 Days Discharge Instructions: Secure with tape as directed. Laboratory erobe culture (MICRO) - Abscess of Left Lower Leg Bacteria identified in Unspecified specimen by A LOINC Code: 161-0 Convenience Name: Areobic culture-specimen not specified Electronic Signature(s) Signed: 10/26/2021 4:30:32 PM By: Linton Ham MD Signed: 10/26/2021 5:08:15 PM By: Lorrin Jackson Entered By: Lorrin Jackson on 10/26/2021 14:45:04 -------------------------------------------------------------------------------- Problem List Details Patient Name: Date of Service: [name], [name] 10/26/2021 1:45 PM Medical Record Number: [id number] Patient Account Number: [id number] Date of Birth/Sex: Treating RN: November 29, 1960 (60 y.o. 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, right ankle and foot 08/30/2021 No Yes E11.51 Type 2 diabetes mellitus with diabetic peripheral angiopathy without gangrene 02/19/2021 No Yes I87.2 Venous insufficiency (chronic) (peripheral) 06/18/2021 No Yes Inactive Problems ICD-10 Code Description Active Date Inactive Date L97.811 Non-pressure chronic ulcer of other part of right lower leg limited to breakdown of skin 02/19/2021 02/19/2021 S81.802A Unspecified open wound, left lower leg, initial encounter 06/18/2021 06/18/2021 S40.812D Abrasion of left upper arm, subsequent encounter 07/20/2021 07/20/2021 Resolved Problems ICD-10 Code Description Active Date Resolved Date S40.811D Abrasion of right upper arm, subsequent encounter 03/29/2021 03/29/2021 Electronic Signature(s) Signed: 10/26/2021 4:30:32 PM By: Linton Ham MD Entered By: Linton Ham on 10/26/2021 14:17:30 -------------------------------------------------------------------------------- Progress Note Details Patient Name: Date of Service: [name], [name] 10/26/2021 1:45 PM Medical Record Number: [id number] Patient Account Number: [id number] Date of Birth/Sex: Treating RN: 26-Nov-1960 (60 y.o. [name] Primary Care Provider: Charlott Rakes Other Clinician: Referring Provider: Treating Provider/Extender: Sindy Guadeloupe Weeks in Treatment: 35 Subjective History of Present Illness (HPI) This 60 year old patient who has a very long significant history of diabetes mellitus, previous alcohol and nicotine abuse, chronic data disease, COPD, diabetes mellitus, height hypertension, critical lower limb ischemia with several wounds being managed at the wound Center at [facility] for over a year. She was recently in the ER at [facility] and was referred to our center. He has had a long history of critical limb ischemia and over a period of time has had balloon angioplasties in March 2016, endarterectomy of the left carotid, lower extremity angiogram and treatment by Dr. Quay Burow, several cardiac catheterizations. Most recently she had an x-ray of her left foot while in the ER which showed no acute abnormality. during this ER visit she was started on ciprofloxacin and asked to continue with the wound care physicians at [facility]. Her last ABI done in June 2017 showed the right side to be 0.28 in the left side to be  0.48. Her right toe brachial index was 0.17 on the right and 0.27 on the left. her last hemoglobin A1c was 11.1% She continues to smoke about a pack of cigarettes a day. 03/28/2017 -- -- right foot x-ray -- IMPRESSION:Areas of soft tissue swelling. Mild subluxation second PIP joint. No frank dislocation or fracture. No erosive change or bony destruction. No soft tissue ulceration or radiopaque foreign body evident by radiography. There is plantar fascia calcification with a nearby inferior calcaneal spur. There are foci of arterial vascular calcification. 04/04/2017 -- the patient continues to be noncompliant and continues to complain of a lot of pain and has not done anything about quitting smoking Readmission: 06/26/18 on evaluation today patient presents for reevaluation she has not been seen in our office since May 2018. Since she was last seen here in our office she has undergone testing at Dr. Fransico Aala Ransom office where it was revealed that she had an ABI of 0.68 with her previous ABI being 0.64 and a left ABI of 0.38 with previous ABI being 0.34 this study which was performed on 04/05/18 was pretty much equivalent to the study performed on 11/22/17. The findings in the end revealed on the right would appear to be moderate right lower  extremity arterial disease on the left Dr. Gwenlyn Found states moderate in the report but unfortunately this appears to be much more severe at 0.38 compared to the right. Subsequently the patient was scheduled to have angiography with Dr. Gwenlyn Found on 04/12/18. This was however canceled due to the fact that the patient was found to have chronic kidney disease stage III and it was to the point that he did not feel that it will be safe to pursue angiography at that point. She has not been on any antibiotics recently. At this point Dr. Gwenlyn Found has not rescheduled anything as far as the injured [place] according to the patient he is somewhat reluctant to do so. Nonetheless with her diminished blood flow this is gonna make it somewhat difficult for her to heal. 07/05/18; this is a patient I have not seen previously. She has very significant PAD as noted above. She apparently has had revascularization efforts by Dr. Gwenlyn Found on the right on 3 occasions to the patient. She was supposed to have an attempted angiography on the left however this was canceled apparently because of stage III chronic renal failure. I will need to research all of this. She complains of significant pain in the wound and has claudication enough that when she walks to the end of the driveway she has to stop. She is been using silver alginate to the wounds on her legs and Iodoflex to the area on her left second toe. 07/13/18 on evaluation today patient's wounds actually appear to be doing about the same. She has an appointment she tells me within the next month that is September 2019 with Dr. Gwenlyn Found to discuss options to see if there's anything else that you can recommend or do for her. Nonetheless obviously what we're trying to do is do what we can to save her leg and in turn prevent any additional worsening or damage. None in the meantime we been mainly trying to manage her ulcers as best we can. 07/27/18; some improvement in the multiplicity of wounds on  her left lower extremity and foot. She's been using silver alginate. I was unable to determine that she actually has an appointment with Dr. Gwenlyn Found. We are checking into this The patient's wound includes Left lateral  foot, left plantar heel, her left anterior calf wound looks close to me, left fourth toe is very close to closed and the left medial calf is perhaps the largest open area READMISSION 02/19/2021 This is a now 60 year old woman with type 2 diabetes and continued cigarette smoking. She has known PAD. She was last in this clinic in 2019 at that point with wounds on her left foot. She left in a nonhealed state. On 06/28/2019 I see she had a left femoropopliteal by vein and vascular with an amputation of the fifth ray. These managed to heal over. Her last arterial studies were in February 2021. This showed an absent waveform at the posterior tibial artery on the right a dampened monophasic dorsalis pedis on the right of 0.44. On the left again the PTA TA was absent her dorsalis pedis was 0.99 triphasic and her great toe pressure was 0.65. This would have been after her revascularization. Once again she tells me that Dr. Gwenlyn Found has done revascularization on the right leg on 3 different occasions. She has a right transmetatarsal amputation apparently done by Dr. Sharol Given remotely but I do not see a note for these right lower extremity revascularization but I may not of looked back far enough. In any case she says that the she has a wound on the plantar aspect of the right transmetatarsal amputation site there is been there for several months. More recently she fell and had a wound on her right medial lower leg she had 5 sutures placed in the ER and then subsequently she has developed an area on the medial lower leg which was a blister that opened up. Past medical history includes type 2 diabetes, PAD, chronic renal failure, congestive heart failure, right transmetatarsal amputation, left fifth ray  amputation, continued tobacco abuse, atrial fibrillation and cirrhosis. We did not attempt an ABI on the right leg today because of pain 02/26/2021; patient we admitted to the clinic last week. She has what looks to be 2 areas on her right medial and right anterior lower leg that look more like venous wounds but she has 1 on the first met head at the base of her right transmet. She has known severe PAD. She complains of a lot of pain although some of this may be neuropathic. Is difficult to exclude a component of claudication. Use silver alginate on the wounds on her legs and Iodoflex on the area on the first met head. She has an appointment with Dr. Gwenlyn Found on 5/25 although I will text him and discussed the situation. She is have poorly controlled diabetic. She has has stage IIIb chronic renal failure 4/22; patient presents for 1 week follow-up. The 2 areas on her right medial and right anterior lower leg appear well-healing. She has been using silver alginate to this area without issues. She has a first met head ulcer and it is unclear how long this has been there as it was discovered in the ED earlier this month when she was being evaluated for another issue. Iodoflex has been used at this area. 4/29; patient presents for 1 week follow-up. She has been using silver alginate to the leg wounds and Iodoflex to the plantar foot wound. She has had this wrapped with Coban and Kerlix. She has no concerns or complaints today. 5/6; this is a difficult wound on the right plantar foot transmit site. We are not making a lot of progress. She had 2 more venous looking wounds on the right medial and right  anterior lower leg 1 of which is healed. Her appointment with Dr. Gwenlyn Found is on 5/25 5/16; she did not tolerate the offloading shoe we gave her in fact she had a fall with an abrasion on her right forearm she is now back in the modified small shoe which does not offload her foot properly. Her appoint with Dr. Gwenlyn Found  is still on 5/25 we have been using Iodoflex. The area on the right anterior lower leg is healed 7/18; patient presents for follow-up however has not been seen in 2 months. She was last seen at the end of May. She had a fall at the end of June and was hospitalized. She has been unable to follow-up since then. For the wound she has only been keeping it covered with gauze. She is scheduled for an aortogram with lower extremity runoff at the end of July. 7/29; patient presents for 2-week follow-up. She has home health and they have been changing her dressings. She denies any issues and has no complaints today. She states she had a procedure where they opened up one of her vessels in her legs. She denies signs of infection. 8/5; patient presents for follow-up. She was recently in the hospital for bradycardia. She was noted to have cellulitis to the left lower extremity and Unna boot was placed due to blisters and increased swelling. She reports improvement to her left leg since being in the hospital and stability check her right plantar foot wound. She denies signs of infection. 8/23; since I last saw this patient a lot has happened. She still has the area over the right first met head in the setting of previous transmetatarsal amputation. Firstly most importantly she underwent revascularization of her occluded right SFA. She underwent directional atherectomy followed by drug-coated balloon angioplasty. She has no named vessel below the knee hopefully the revascularization of the right SFA will improve collateral flow. It is not felt however that she has any endovascular options. She had follow-up arterial studies noninvasive on 8/9. These showed an ABI of 0.44 at the right PTA. Monophasic waveforms. On the left her great toe ABI was 0.68. Her follow-up arterial Doppler showed a 50 to 74% stenosis in the proximal SFA and a 50 to 74% stenosis in the distal SFA mild to severe atherosclerosis noted  throughout the extremity. Areas of shadowing plaque seen, unable to rule out higher grade stenosis she also had a fall apparently wearing a right foot forefoot off loader that we gave her. She therefore comes in in an ordinary running shoe today. Not certain if this is the fall that ended up in the hospital with bradycardia 9/6 area over the right first metatarsal head in the setting of her previous amputation and severe PAD. She is also a diabetic with known PAD status post attempt at revascularization. She is continued smoker Again the separation of visits in the clinic is somewhat disturbing if we are going to consider her for a total contact cast. She is not wearing anything to offload this stating it causes imbalance especially the forefoot off loader. She basically comes in in bedroom slippers She also had a fall this morning about an hour ago. She has a skin tear on the left dorsal forearm 9/13; areas over the right first metatarsal head in the setting of previous TMA and severe PAD. Again she comes in here in slippers. We have been using Hydrofera Blue. We use MolecuLight on this which was essentially negative study 9/20; right first metatarsal  head in the setting of her previous TMA and severe PAD. Same slipper type shoes. She says she cannot wear a forefoot off loader and for some reason she will not wear surgical shoes. She says she is smoking half a pack per day. 9/30; right first metatarsal head. Absolutely no improvement in fact there is tunneling superiorly very close to bone. She does not offload this properly at all. We use MolecuLight on this 2 weeks ago that did not show any surface bacteria we have been using silver collagen is a dressing She tells me she is down to 3 cigarettes a day I have offered encouragement and a treatment plan 10/6; right first metatarsal head. Exposed bone this week which is not surprising. We have been using silver collagen. She is smoking 5 to 10  cigarettes a day 10/17; she has not been here in 10 days. The bone scraping that I did on 10/6 showed staph lugdunensis, staph epidermidis and Pseudomonas Alcalifenes. I put in for Septra DS 1 p.o. twice daily for 14 days last week would be but we could not reach her to start the antibiotic. Quinolones would have been a good alternative except she has multiple drug interactions. Septra would not cover what ever the Pseudomonas is but I am not really sure of the relevance here. I am going to try her on the Septra for 2 weeks. She has a probing wound on the right TMA that probes to bone. She claims to have just about stop smoking I reviewed her arterial status. She underwent a left fem-tib bypass by Dr. Donnetta Hutching in 06/28/2019. Her last angiogram was in July of this year by Dr. Gwenlyn Found. This showed an occluded right SFA the popliteal and tibial vessels were also occluded the peroneal vessel filled by collaterals and filled the PT and DP by communicating collaterals. She was not felt to have any additional and vascular options. Dr. Gwenlyn Found noted that she he had previously revascularized her right SFA 3 times. 10/25; she is tolerating the Septra but says it makes her nauseated. I am going to continue this for an additional 2 weeks perhaps for a total of 6 weeks. Her wound does not look too much different. There is on the right first met metatarsal head. There is probing areas around the tissue that is in the middle of the wound. There is no purulence I cannot feel bone. The original source was for a bone scraping. 11/8; right first metatarsal head. This does not probe to bone and there is no purulence. As far as I can tell she is completed the Septra although there were problems with exact length of time and I think compliance. In any case I gave her 6 weeks. I have reviewed her PAD above. 11/15; right first metatarsal head. Again protruding subcutaneous tissue but this does not have any adherence to surrounding  skin. Undermining circumferentially. There is no palpable bone. Not grossly purulent. We have been using Iodoflex to try to get some adherence to clean off the surface but no real improvement. I do not think they actually had enough supplies listening to the patient. She completed the 6 weeks of Septra I gave her, but I am not sure about the compliance with the dates i.e. may have missed days taken 1 a day etc. The patient has a vascular follow-up with Dr. Gwenlyn Found this coming Friday i.e. in 3 days. By my read of arterial evaluations I do not think she is felt to be a candidate  for any further revascularization. The last angiogram was in July. 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 She continues to smoke now up to 10 cigarettes a day. She is not in any pain. Wound has not improved but is certainly not worsened. I gave her 6 weeks of trimethoprim/sulfamethoxazole which she is completed in some fashion. 11/29 right first metatarsal head previous TMA. Wound actually looks somewhat better today still a lot of callus around the wound on debris on the wound surface. UNFORTUNATELY the patient missed her appointment with Dr. Gwenlyn Found and is not rebooked apparently until sometime in February 12/13; right first metatarsal wound actually looks some smaller today. I did not debride this today. She is using Iodoflex. oo When she came in last week she had a large abscess on her left anterior lower leg apparently after falling we send her to the ER to have this drained which was done. I cannot see any cultures x-ray was negative. She was apparently given 2 doses of IV antibiotics and discharged on Bactrim DS twice daily which she is still taking. As mentioned I cannot see any cultures. Objective Constitutional Patient is hypertensive.. Pulse regular and within target range for patient.[name] Respirations regular, non-labored and within target range.. Temperature is normal and within the target range for  the patient.[name] Appears in no distress. Vitals Time Taken: 2:01 PM, Height: 64 in, Weight: 222 lbs, BMI: 38.1, Temperature: 98.5 F, Pulse: 89 bpm, Respiratory Rate: 20 breaths/min, Blood Pressure: 156/73 mmHg. General Notes: Wound exam; right first metatarsal head this looks somewhat better. No debridement I will see how this looks next week. oo Recurrent abscess left anterior upper tibia. I used a #15 scalpel to open and drain this. Copious amounts of purulent material including the core of the abscess. Specimen for culture obtained Integumentary (Hair, Skin) Wound #16 status is Open. Original cause of wound was Gradually Appeared. The date acquired was: 02/05/2021. The wound has been in treatment 35 weeks. The wound is located on the Right,Plantar Amputation Site - Transmetatarsal. The wound measures 0.3cm length x 0.3cm width x 0.3cm depth; 0.071cm^2 area and 0.021cm^3 volume. There is Fat Layer (Subcutaneous Tissue) exposed. There is no tunneling or undermining noted. There is a medium amount of serosanguineous drainage noted. The wound margin is distinct with the outline attached to the wound base. There is large (67-100%) pink, pale granulation within the wound bed. There is a small (1-33%) amount of necrotic tissue within the wound bed including Adherent Slough. Wound #22 status is Open. Original cause of wound was Other Lesion. The date acquired was: 10/18/2021. The wound is located on the Left,Anterior Lower Leg. The wound measures 0.4cm length x 1.8cm width x 0.2cm depth; 0.565cm^2 area and 0.113cm^3 volume. There is Fat Layer (Subcutaneous Tissue) exposed. There is no tunneling or undermining noted. There is a medium amount of purulent drainage noted. The wound margin is distinct with the outline attached to the wound base. There is large (67-100%) red granulation within the wound bed. There is no necrotic tissue within the wound bed. Assessment Active Problems ICD-10 Non-pressure  chronic ulcer of other part of right foot with other specified severity Type 2 diabetes mellitus with foot ulcer Cutaneous abscess of left lower limb Other chronic osteomyelitis, right ankle and foot Type 2 diabetes mellitus with diabetic peripheral angiopathy without gangrene Venous insufficiency (chronic) (peripheral) Procedures Wound #22 Pre-procedure diagnosis of Wound #22 is an Abscess located on the Left, Anterior Lower Leg . Abscess incision  and drainage was provided by Ricard Dillon., MD. The skin was cleansed and prepped with anti-septic. An incision was made in the Left Anterior Leg with the following instrument(s): Blade. There was an immediate release of Purulent fluid. A Moderate amount of bleeding was controlled with Pressure. A time out was conducted at 14:12, prior to the start of the procedure. Swab culture was sent. The procedure was tolerated well. Post procedure Diagnosis Wound #22: Same as Pre-Procedure Plan Follow-up Appointments: Return Appointment in 1 week. - Dr. Dellia Nims Bathing/ Shower/ Hygiene: May shower with protection but do not get wound dressing(s) wet. Edema Control - Lymphedema / SCD / Other: Elevate legs to the level of the heart or above for 30 minutes daily and/or when sitting, a frequency of: - throughout the day Avoid standing for long periods of time. Patient to wear own compression stockings every day. - left leg Exercise regularly Off-Loading: Open toe surgical shoe to: - with felt padding to offload Additional Orders / Instructions: Stop/Decrease Smoking Follow Nutritious Diet WOUND #16: - Amputation Site - Transmetatarsal Wound Laterality: Plantar, Right Cleanser: Soap and Water 1 x Per Week/7 Days Discharge Instructions: May shower and wash wound with dial antibacterial soap and water prior to dressing change. Cleanser: Wound Cleanser 1 x Per Week/7 Days Discharge Instructions: Cleanse the wound with wound cleanser or normal saline prior  to applying a clean dressing using gauze sponges, not tissue or cotton balls. Peri-Wound Care: Sween Lotion (Moisturizing lotion) (Home Health) 1 x Per Week/7 Days Discharge Instructions: Apply moisturizing lotion as directed Prim Dressing: IODOFLEX 0.9% Cadexomer Iodine Pad 4x6 cm 1 x Per Week/7 Days ary Discharge Instructions: Apply to wound bed as instructed Secondary Dressing: Woven Gauze Sponge, Non-Sterile 4x4 in 1 x Per Week/7 Days Discharge Instructions: Apply over primary dressing as directed. Secondary Dressing: ABD Pad, 5x9 1 x Per Week/7 Days Discharge Instructions: Apply over primary dressing as directed. Secondary Dressing: Optifoam Non-Adhesive Dressing, 4x4 in 1 x Per Week/7 Days Discharge Instructions: Foam donut to help offload Com pression Wrap: Kerlix Roll 4.5x3.1 (in/yd) 1 x Per Week/7 Days Discharge Instructions: Apply Kerlix and Coban compression as directed. Com pression Wrap: Coban Self-Adherent Wrap 4x5 (in/yd) 1 x Per Week/7 Days Discharge Instructions: Apply over Kerlix as directed. WOUND #22: - Lower Leg Wound Laterality: Left, Anterior Cleanser: Soap and Water 1 x Per Day/15 Days Discharge Instructions: May shower and wash wound with dial antibacterial soap and water prior to dressing change. Topical: Antibiotic Ointment 1 x Per Day/15 Days Prim Dressing: KerraCel Ag Gelling Fiber Dressing, 2x2 in (silver alginate) 1 x Per Day/15 Days ary Discharge Instructions: Apply silver alginate to wound bed as instructed Secondary Dressing: Woven Gauze Sponge, Non-Sterile 4x4 in 1 x Per Day/15 Days Discharge Instructions: Apply over primary dressing as directed. Secured With: 55M Medipore H Soft Cloth Surgical T ape, 4 x 10 (in/yd) 1 x Per Day/15 Days Discharge Instructions: Secure with tape as directed. 1. We are still using Iodoflex on the right foot. Careful attention to this next time 2. IandD of an abscess on the left lower extremity. She is already on Bactrim DS I  did a specimen for culture in case this was resistant. No additional antibiotics for now Electronic Signature(s) Signed: 10/26/2021 4:30:32 PM By: Linton Ham MD Entered By: Linton Ham on 10/26/2021 14:24:02 -------------------------------------------------------------------------------- SuperBill Details Patient Name: Date of Service: [name], [name] 10/26/2021 Medical Record Number: [id number] Patient Account Number: [id number] Date of Birth/Sex: Treating RN:  February 21, 1961 (60 y.o. [name] Primary Care Provider: Charlott Rakes Other Clinician: Referring Provider: Treating Provider/Extender: Sindy Guadeloupe Weeks in Treatment: 35 Diagnosis Coding ICD-10 Codes Code Description [phone] Non-pressure chronic ulcer of other part of right foot with other specified severity 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 2 diabetes mellitus with foot ulcer L02.416 Cutaneous abscess of left lower limb M86.671 Other chronic osteomyelitis, right ankle and foot E11.51 Type 2 diabetes mellitus with diabetic peripheral angiopathy without gangrene I87.2 Venous insufficiency (chronic) (peripheral) Facility Procedures CPT4 Code: [id number] Description: 10060 - I and D Abscess; simple/single ICD-10 Diagnosis Description L02.416 Cutaneous abscess of left lower limb Modifier: Quantity: 1 Physician Procedures : CPT4 Code Description Modifier [id number] - I and D Abscess; simple/single ICD-10 Diagnosis Description L02.416 Cutaneous abscess of left lower limb Quantity: 1 Electronic Signature(s) Signed: 10/26/2021 4:30:32 PM By: Linton Ham MD Signed: 10/26/2021 5:08:15 PM By: Lorrin Jackson Entered By: Lorrin Jackson on 10/26/2021 14:46:59

## 2021-10-26 NOTE — Progress Notes (Signed)
**Note Karla-Identified via Obfuscation** [name], [name] ([id number]) Visit Report for 10/26/2021 Arrival Information Details Patient Name: Date of Service: [name] 10/26/2021 1:45 PM Medical Record Number: [id number] Patient Account Number: [id number] Date of Birth/Sex: Treating RN: 05-08-1961 (60 y.o. [name] Primary Care Marius Betts: Charlott Rakes Other Clinician: Referring Ayslin Kundert: Treating Arhum Peeples/Extender: Ramon Dredge in Treatment: 35 Visit Information History Since Last Visit Added or deleted any medications: No Patient Arrived: Wheel Chair Any new allergies or adverse reactions: No Arrival Time: 14:00 Had a fall or experienced change in Yes Transfer Assistance: None activities of daily living that may affect Patient Identification Verified: Yes risk of falls: Secondary Verification Process Completed: Yes Signs or symptoms of abuse/neglect since No Patient Requires Transmission-Based Precautions: No last visito Patient Has Alerts: No Hospitalized since last visit: No Implantable device outside of the clinic No excluding cellular tissue based products placed in the center since last visit: Has Dressing in Place as Prescribed: Yes Has Compression in Place as Prescribed: Yes Has Footwear/Offloading in Place as Yes Prescribed: Right: Surgical Shoe with Pressure Relief Insole Pain Present Now: No Electronic Signature(s) Signed: 10/26/2021 5:08:15 PM By: Lorrin Jackson Entered By: Lorrin Jackson on 10/26/2021 14:03:50 -------------------------------------------------------------------------------- Encounter Discharge Information Details Patient Name: Date of Service: Karla Corwin, [name] 10/26/2021 1:45 PM Medical Record Number: [id number] Patient Account Number: [id number] Date of Birth/Sex: Treating RN: [birth year] (60 y.o. [name] Primary Care Floetta Brickey: Charlott Rakes Other Clinician: Referring Elohim Brune: Treating Briannie Gutierrez/Extender: Ramon Dredge in Treatment: 35 Encounter Discharge Information Items Post Procedure Vitals Discharge Condition: Stable Temperature (F): 98.5 Ambulatory Status: Wheelchair Pulse (bpm): 89 Discharge Destination: Home Respiratory Rate (breaths/min): 20 Transportation: Private Auto Blood Pressure (mmHg): 156/73 Schedule Follow-up Appointment: Yes Clinical Summary of Care: Provided on 10/26/2021 Form Type Recipient Paper Patient Patient Electronic Signature(s) Signed: 10/26/2021 5:08:15 PM By: Lorrin Jackson Signed: 10/26/2021 5:08:15 PM By: Lorrin Jackson Entered By: Lorrin Jackson on 10/26/2021 14:46:37 -------------------------------------------------------------------------------- Lower Extremity Assessment Details Patient Name: Date of Service: Karla Corwin, [name] 10/26/2021 1:45 PM Medical Record Number: [id number] Patient Account Number: [id number] Date of Birth/Sex: Treating RN: 01-May-1961 (60 y.o. [name] Primary Care Maycie Luera: Charlott Rakes Other Clinician: Referring Leata Dominy: Treating Venida Tsukamoto/Extender: Sindy Guadeloupe Weeks in Treatment: 35 Edema Assessment Assessed: [Left: No] [Right: Yes] Edema: [Left: Ye] [Right: s] Calf Left: Right: Point of Measurement: From Medial Instep 36 cm Ankle Left: Right: Point of Measurement: From Medial Instep 19.6 cm Vascular Assessment Pulses: Dorsalis Pedis Palpable: [Right:Yes] Electronic Signature(s) Signed: 10/26/2021 5:08:15 PM By: Lorrin Jackson Entered By: Lorrin Jackson on 10/26/2021 14:05:11 -------------------------------------------------------------------------------- Multi Wound Chart Details Patient Name: Date of Service: Karla Corwin, [name] 10/26/2021 1:45 PM Medical Record Number: [id number] Patient Account Number: [id number] Date of Birth/Sex: Treating RN: 1961-08-12 (60 y.o. [name] Primary Care Keyry Iracheta: Charlott Rakes Other Clinician: Referring  Lisle Skillman: Treating Romon Devereux/Extender: Sindy Guadeloupe Weeks in Treatment: 35 Vital Signs Height(in): 64 Pulse(bpm): 50 Weight(lbs): 222 Blood Pressure(mmHg): 156/73 Body Mass Index(BMI): 38 Temperature(F): 98.5 Respiratory Rate(breaths/min): 20 Photos: [16:No Photos Right, Plantar Amputation Site -] [22:No Photos Left, Anterior Lower Leg] [N/A:N/A N/A] Wound Location: [16:Transmetatarsal Gradually Appeared] [22:Other Lesion] [N/A:N/A] Wounding Event: [16:Diabetic Wound/Ulcer of the Lower] [22:Abscess] [N/A:N/A] Primary Etiology: [16:Extremity Chronic Obstructive Pulmonary] [22:Chronic Obstructive Pulmonary] [N/A:N/A] Comorbid History: [16:Disease (COPD), Sleep Apnea, Arrhythmia, Congestive Heart Failure,Arrhythmia, Congestive Heart Failure, Coronary Artery Disease, Hypertension, Peripheral Arterial Disease, Cirrhosis , Type II Diabetes, Osteoarthritis, Neuropathy  02/05/2021] [  22:Disease (COPD), Sleep Apnea, Coronary Artery Disease, Hypertension, Peripheral Arterial Osteoarthritis, Neuropathy 10/18/2021] [N/A:N/A] Date Acquired: [16:35] [22:0] [N/A:N/A] Weeks of Treatment: [16:Open] [22:Open] [N/A:N/A] Wound Status: [16:0.3x0.3x0.3] [22:0.4x1.8x0.2] [N/A:N/A] Measurements L x W x D (cm) [16:0.071] [22:0.565] [N/A:N/A] A (cm) : rea [16:0.021] [16:1.096] [N/A:N/A] Volume (cm) : [16:95.70%] [22:N/A] [N/A:N/A] % Reduction in Area: [16:95.70%] [22:N/A] [N/A:N/A] % Reduction in Volume: [16:Grade 2] [22:Full Thickness Without Exposed] [N/A:N/A] Classification: [16:Medium] [22:Support Structures Medium] [N/A:N/A] Exudate Amount: [16:Serosanguineous] [22:Purulent] [N/A:N/A] Exudate Type: [16:red, brown] [22:yellow, brown, green] [N/A:N/A] Exudate Color: [16:Distinct, outline attached] [22:Distinct, outline attached] [N/A:N/A] Wound Margin: [16:Large (67-100%)] [22:Large (67-100%)] [N/A:N/A] Granulation Amount: [16:Pink, Pale] [22:Red] [N/A:N/A] Granulation Quality: [16:Small  (1-33%)] [22:None Present (0%)] [N/A:N/A] Necrotic Amount: [16:Fat Layer (Subcutaneous Tissue): Yes Fat Layer (Subcutaneous Tissue): Yes N/A] Exposed Structures: [16:Fascia: No Tendon: No Muscle: No Joint: No Bone: No Large (67-100%)] [22:Fascia: No Tendon: No Muscle: No Joint: No Bone: No None] [N/A:N/A] Treatment Notes Electronic Signature(s) Signed: 10/26/2021 4:30:32 PM By: Linton Ham MD Signed: 10/26/2021 4:51:20 PM By: Levan Hurst RN, BSN Entered By: Linton Ham on 10/26/2021 14:17:57 -------------------------------------------------------------------------------- Multi-Disciplinary Care Plan Details Patient Name: Date of Service: Karla Corwin, [name] 10/26/2021 1:45 PM Medical Record Number: [id number] Patient Account Number: [id number] Date of Birth/Sex: Treating RN: 1961/02/06 (60 y.o. [name] Primary Care Lagretta Loseke: Charlott Rakes Other Clinician: Referring Morganne Haile: Treating Alishba Naples/Extender: Ramon Dredge in Treatment: [place] reviewed with physician Active Inactive Wound/Skin Impairment Nursing Diagnoses: Impaired tissue integrity Goals: Patient/caregiver will verbalize understanding of skin care regimen Date Initiated: 02/19/2021 Target Resolution Date: 11/30/2021 Goal Status: Active Ulcer/skin breakdown will have a volume reduction of 30% by week 4 Date Initiated: 02/19/2021 Date Inactivated: 03/29/2021 Target Resolution Date: 04/09/2021 Goal Status: Unmet Unmet Reason: PAD Interventions: Assess patient/caregiver ability to obtain necessary supplies Assess patient/caregiver ability to perform ulcer/skin care regimen upon admission and as needed Assess ulceration(s) every visit Provide education on ulcer and skin care Treatment Activities: Skin care regimen initiated : 02/19/2021 Topical wound management initiated : 02/19/2021 Notes: 10/26/21: Wound care regimen continues Electronic  Signature(s) Signed: 10/26/2021 5:08:15 PM By: Lorrin Jackson Entered By: Lorrin Jackson on 10/26/2021 14:03:18 -------------------------------------------------------------------------------- Pain Assessment Details Patient Name: Date of Service: Durward Parcel S. 10/26/2021 1:45 PM Medical Record Number: [id number] Patient Account Number: [id number] Date of Birth/Sex: Treating RN: 14-Oct-1961 (60 y.o. [name] Primary Care Caira Poche: Charlott Rakes Other Clinician: Referring Rosalie Gelpi: Treating Glendel Jaggers/Extender: Sindy Guadeloupe Weeks in Treatment: 35 Active Problems Location of Pain Severity and Description of Pain Patient Has Paino No Site Locations Pain Management and Medication Current Pain Management: Electronic Signature(s) Signed: 10/26/2021 5:08:15 PM By: Lorrin Jackson Entered By: Lorrin Jackson on 10/26/2021 14:01:53 -------------------------------------------------------------------------------- Patient/Caregiver Education Details Patient Name: Date of Service: [name] 12/13/2022andnbsp1:45 PM Medical Record Number: [id number] Patient Account Number: [id number] Date of Birth/Gender: Treating RN: 1961-06-07 (60 y.o. [name] Primary Care Physician: Charlott Rakes Other Clinician: Referring Physician: Treating Physician/Extender: Ramon Dredge in Treatment: 35 Education Assessment Education Provided To: Patient Education Topics Provided Venous: Methods: Explain/Verbal, Printed Responses: State content correctly Wound/Skin Impairment: Methods: Explain/Verbal, Printed Responses: State content correctly Electronic Signature(s) Signed: 10/26/2021 5:08:15 PM By: Lorrin Jackson Entered By: Lorrin Jackson on 10/26/2021 14:03:41 -------------------------------------------------------------------------------- Wound Assessment Details Patient Name: Date of Service: Durward Parcel S. 10/26/2021  1:45 PM Medical Record Number: [id number] Patient Account Number: [id number] Date of Birth/Sex: Treating RN: 1961/07/25 (60 y.o. F)  Lorrin Jackson Primary Care Eiza Canniff: Charlott Rakes Other Clinician: Referring Cassady Turano: Treating Rashi Granier/Extender: Sindy Guadeloupe Weeks in Treatment: 35 Wound Status Wound Number: 16 Primary Diabetic Wound/Ulcer of the Lower Extremity Etiology: Wound Location: Right, Plantar Amputation Site - Transmetatarsal Wound Open Wounding Event: Gradually Appeared Status: Date Acquired: 02/05/2021 Comorbid Chronic Obstructive Pulmonary Disease (COPD), Sleep Apnea, Weeks Of Treatment: 35 History: Arrhythmia, Congestive Heart Failure, Coronary Artery Disease, Clustered Wound: No Hypertension, Peripheral Arterial Disease, Cirrhosis , Type II Diabetes, Osteoarthritis, Neuropathy Wound Measurements Length: (cm) 0.3 Width: (cm) 0.3 Depth: (cm) 0.3 Area: (cm) 0.071 Volume: (cm) 0.021 % Reduction in Area: 95.7% % Reduction in Volume: 95.7% Epithelialization: Large (67-100%) Tunneling: No Undermining: No Wound Description Classification: Grade 2 Wound Margin: Distinct, outline attached Exudate Amount: Medium Exudate Type: Serosanguineous Exudate Color: red, brown Foul Odor After Cleansing: No Slough/Fibrino Yes Wound Bed Granulation Amount: Large (67-100%) Exposed Structure Granulation Quality: Pink, Pale Fascia Exposed: No Necrotic Amount: Small (1-33%) Fat Layer (Subcutaneous Tissue) Exposed: Yes Necrotic Quality: Adherent Slough Tendon Exposed: No Muscle Exposed: No Joint Exposed: No Bone Exposed: No Treatment Notes 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 Wound Cleanser Discharge Instruction: Cleanse the wound with wound cleanser or normal saline  prior to applying a clean dressing using gauze sponges, not tissue or cotton balls. Peri-Wound Care Sween Lotion (Moisturizing lotion) Discharge Instruction: Apply moisturizing lotion as directed Topical Primary Dressing IODOFLEX 0.9% Cadexomer Iodine Pad 4x6 cm Discharge Instruction: Apply to wound bed as instructed Secondary Dressing Woven Gauze Sponge, Non-Sterile 4x4 in Discharge Instruction: Apply over primary dressing as directed. ABD Pad, 5x9 Discharge Instruction: Apply over primary dressing as directed. Optifoam Non-Adhesive Dressing, 4x4 in Discharge Instruction: Foam donut to help offload Secured With Compression Wrap Kerlix Roll 4.5x3.1 (in/yd) Discharge Instruction: Apply Kerlix and Coban compression as directed. Coban Self-Adherent Wrap 4x5 (in/yd) Discharge Instruction: Apply over Kerlix as directed. Compression Stockings Add-Ons Electronic Signature(s) Signed: 10/26/2021 5:08:15 PM By: Lorrin Jackson Entered By: Lorrin Jackson on 10/26/2021 14:02:30 -------------------------------------------------------------------------------- Wound Assessment Details Patient Name: Date of Service: Karla Corwin, [name] 10/26/2021 1:45 PM Medical Record Number: [id number] Patient Account Number: [id number] Date of Birth/Sex: Treating RN: [birth year] (60 y.o. [name] Primary Care Harsimran Westman: Charlott Rakes Other Clinician: Referring Arryn Terrones: Treating Candler Ginsberg/Extender: Sindy Guadeloupe Weeks in Treatment: 35 Wound Status Wound Number: 22 Primary Abscess Etiology: Wound Location: Left, Anterior Lower Leg Wound Open Wounding Event: Other Lesion Status: Date Acquired: 10/18/2021 Comorbid Chronic Obstructive Pulmonary Disease (COPD), Sleep Apnea, Weeks Of Treatment: 0 History: Arrhythmia, Congestive Heart Failure, Coronary Artery Disease, Clustered Wound: No Hypertension, Peripheral Arterial Disease, Cirrhosis , Type II Diabetes, Osteoarthritis,  Neuropathy Wound Measurements Length: (cm) 0.4 Width: (cm) 1.8 Depth: (cm) 0.2 Area: (cm) 0.565 Volume: (cm) 0.113 % Reduction in Area: % Reduction in Volume: Epithelialization: None Tunneling: No Undermining: No Wound Description Classification: Full Thickness Without Exposed Support Struct Wound Margin: Distinct, outline attached Exudate Amount: Medium Exudate Type: Purulent Exudate Color: yellow, brown, green ures Foul Odor After Cleansing: No Slough/Fibrino No Wound Bed Granulation Amount: Large (67-100%) Exposed Structure Granulation Quality: Red Fascia Exposed: No Necrotic Amount: None Present (0%) Fat Layer (Subcutaneous Tissue) Exposed: Yes Tendon Exposed: No Muscle Exposed: No Joint Exposed: No Bone Exposed: No Treatment Notes Wound #22 (Lower Leg) Wound Laterality: Left, Anterior Cleanser Soap and Water Discharge Instruction: May shower  and wash wound with dial antibacterial soap and water prior to dressing change. Peri-Wound Care Topical Antibiotic Ointment Primary Dressing KerraCel Ag Gelling Fiber Dressing, 2x2 in (silver alginate) Discharge Instruction: Apply silver alginate to wound bed as instructed Secondary Dressing Woven Gauze Sponge, Non-Sterile 4x4 in Discharge Instruction: Apply over primary dressing as directed. Secured With 64M Medipore H Soft Cloth Surgical T ape, 4 x 10 (in/yd) Discharge Instruction: Secure with tape as directed. Compression Wrap Compression Stockings Add-Ons Electronic Signature(s) Signed: 10/26/2021 5:08:15 PM By: Lorrin Jackson Entered By: Lorrin Jackson on 10/26/2021 14:17:16 -------------------------------------------------------------------------------- Vitals Details Patient Name: Date of Service: Karla Corwin, [name] 10/26/2021 1:45 PM Medical Record Number: [id number] Patient Account Number: [id number] Date of Birth/Sex: Treating RN: Jul 10, 1961 (60 y.o. [name] Primary Care Maudell Stanbrough: Charlott Rakes Other Clinician: Referring Samir Ishaq: Treating Amandine Covino/Extender: Sindy Guadeloupe Weeks in Treatment: 35 Vital Signs Time Taken: 14:01 Temperature (F): 98.5 Height (in): 64 Pulse (bpm): 89 Weight (lbs): 222 Respiratory Rate (breaths/min): 20 Body Mass Index (BMI): 38.1 Blood Pressure (mmHg): 156/73 Reference Range: 80 - 120 mg / dl Electronic Signature(s) Signed: 10/26/2021 5:08:15 PM By: Lorrin Jackson Entered By: Lorrin Jackson on 10/26/2021 14:01:44

## 2021-10-26 NOTE — Progress Notes (Signed)
[name], [name] ([id number]) Visit Report for 10/26/2021 Fall Risk Assessment Details Patient Name: Date of Service: [name] 10/26/2021 1:45 PM Medical Record Number: [id number] Patient Account Number: [id number] Date of Birth/Sex: Treating RN: [birth year] (60 y.o. [name] Primary Care Jone Panebianco: Charlott Rakes Other Clinician: Referring Onda Kattner: Treating Aly Hauser/Extender: Sindy Guadeloupe Weeks in Treatment: 30 Fall Risk Assessment Items Have you had 2 or more falls in the last 12 monthso 0 Yes Have you had any fall that resulted in injury in the last 12 monthso 0 Yes FALLS RISK SCREEN History of falling - immediate or within 3 months 25 Yes Secondary diagnosis (Do you have 2 or more medical diagnoseso) 15 Yes Ambulatory aid None/bed rest/wheelchair/nurse 0 No Crutches/cane/walker 15 Yes Furniture 0 No Intravenous therapy Access/Saline/Heparin Lock 0 No Gait/Transferring Normal/ bed rest/ wheelchair 0 No Weak (short steps with or without shuffle, stooped but able to lift head while walking, may seek 10 Yes support from furniture) Impaired (short steps with shuffle, may have difficulty arising from chair, head down, impaired 0 No balance) Mental Status Oriented to own ability 0 Yes Electronic Signature(s) Signed: 10/26/2021 5:08:15 PM By: Lorrin Jackson Entered By: Lorrin Jackson on 10/26/2021 14:04:19

## 2021-10-27 ENCOUNTER — Other Ambulatory Visit: Payer: Self-pay

## 2021-10-27 ENCOUNTER — Emergency Department (HOSPITAL_COMMUNITY)
Admission: EM | Admit: 2021-10-27 | Discharge: 2021-10-27 | Disposition: A | Discharge status: Home / Self Care | Payer: Medicare Other | Attending: Emergency Medicine | Admitting: Emergency Medicine

## 2021-10-27 ENCOUNTER — Encounter (HOSPITAL_COMMUNITY): Payer: Self-pay

## 2021-10-27 DIAGNOSIS — E039 Hypothyroidism, unspecified: Secondary | ICD-10-CM | POA: Diagnosis not present

## 2021-10-27 DIAGNOSIS — E1140 Type 2 diabetes mellitus with diabetic neuropathy, unspecified: Secondary | ICD-10-CM | POA: Insufficient documentation

## 2021-10-27 DIAGNOSIS — I5043 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) and diastolic (congestive) heart failure: Secondary | ICD-10-CM | POA: Diagnosis not present

## 2021-10-27 DIAGNOSIS — Z7902 Long term (current) use of antithrombotics/antiplatelets: Secondary | ICD-10-CM | POA: Diagnosis not present

## 2021-10-27 DIAGNOSIS — F17210 Nicotine dependence, cigarettes, uncomplicated: Secondary | ICD-10-CM | POA: Insufficient documentation

## 2021-10-27 DIAGNOSIS — I2510 Atherosclerotic heart disease of native coronary artery without angina pectoris: Secondary | ICD-10-CM | POA: Diagnosis not present

## 2021-10-27 DIAGNOSIS — Z7984 Long term (current) use of oral hypoglycemic drugs: Secondary | ICD-10-CM | POA: Insufficient documentation

## 2021-10-27 DIAGNOSIS — E1122 Type 2 diabetes mellitus with diabetic chronic kidney disease: Secondary | ICD-10-CM | POA: Diagnosis not present

## 2021-10-27 DIAGNOSIS — I4891 Unspecified atrial fibrillation: Secondary | ICD-10-CM | POA: Diagnosis not present

## 2021-10-27 DIAGNOSIS — I130 Hypertensive heart and chronic kidney disease with heart failure and stage 1 through stage 4 chronic kidney disease, or unspecified chronic kidney disease: Secondary | ICD-10-CM | POA: Insufficient documentation

## 2021-10-27 DIAGNOSIS — Z955 Presence of coronary angioplasty implant and graft: Secondary | ICD-10-CM | POA: Diagnosis not present

## 2021-10-27 DIAGNOSIS — Z7901 Long term (current) use of anticoagulants: Secondary | ICD-10-CM | POA: Diagnosis not present

## 2021-10-27 DIAGNOSIS — Z7951 Long term (current) use of inhaled steroids: Secondary | ICD-10-CM | POA: Insufficient documentation

## 2021-10-27 DIAGNOSIS — N184 Chronic kidney disease, stage 4 (severe): Secondary | ICD-10-CM | POA: Diagnosis not present

## 2021-10-27 DIAGNOSIS — Z794 Long term (current) use of insulin: Secondary | ICD-10-CM | POA: Diagnosis not present

## 2021-10-27 DIAGNOSIS — J441 Chronic obstructive pulmonary disease with (acute) exacerbation: Secondary | ICD-10-CM | POA: Insufficient documentation

## 2021-10-27 DIAGNOSIS — Z79899 Other long term (current) drug therapy: Secondary | ICD-10-CM | POA: Insufficient documentation

## 2021-10-27 DIAGNOSIS — T148XXA Other injury of unspecified body region, initial encounter: Secondary | ICD-10-CM

## 2021-10-27 DIAGNOSIS — T8189XA Other complications of procedures, not elsewhere classified, initial encounter: Secondary | ICD-10-CM | POA: Diagnosis not present

## 2021-10-27 LAB — CBC WITH DIFFERENTIAL/PLATELET
Abs Immature Granulocytes: 0.03 10*3/uL (ref 0.00–0.07)
Basophils Absolute: 0 10*3/uL (ref 0.0–0.1)
Basophils Relative: 1 %
Eosinophils Absolute: 0.1 10*3/uL (ref 0.0–0.5)
Eosinophils Relative: 1 %
HCT: 32.4 % — ABNORMAL LOW (ref 36.0–46.0)
Hemoglobin: 9.8 g/dL — ABNORMAL LOW (ref 12.0–15.0)
Immature Granulocytes: 0 %
Lymphocytes Relative: 9 %
Lymphs Abs: 0.7 10*3/uL (ref 0.7–4.0)
MCH: 26.6 pg (ref 26.0–34.0)
MCHC: 30.2 g/dL (ref 30.0–36.0)
MCV: 88 fL (ref 80.0–100.0)
Monocytes Absolute: 0.8 10*3/uL (ref 0.1–1.0)
Monocytes Relative: 10 %
Neutro Abs: 6.1 10*3/uL (ref 1.7–7.7)
Neutrophils Relative %: 79 %
Platelets: 360 10*3/uL (ref 150–400)
RBC: 3.68 MIL/uL — ABNORMAL LOW (ref 3.87–5.11)
RDW: 18.6 % — ABNORMAL HIGH (ref 11.5–15.5)
WBC: 7.7 10*3/uL (ref 4.0–10.5)
nRBC: 0 % (ref 0.0–0.2)

## 2021-10-27 LAB — PROTIME-INR
INR: 1 (ref 0.8–1.2)
Prothrombin Time: 13.7 seconds (ref 11.4–15.2)

## 2021-10-27 LAB — APTT: aPTT: 29 seconds (ref 24–36)

## 2021-10-27 NOTE — ED Provider Notes (Signed)
[facility] DEPT Provider Note   CSN: [id number] Arrival date & time: 10/27/21  1304     History Chief Complaint  Patient presents with   Wound Check    [name] is a 60 y.o. female with a PMH of diabetes, tobacco use, CKD, chronic leg wounds who presents with left leg wound that is bleeding through bandage since this morning. Pt complains of left leg wound with previous cellulitis, abscess that was treated, drained yesterday, now presenting for wound check of ongoing bleeding wound since yesterday. Hx of diabetes, on eliquis, maybe also plavix, but patient thinks she had discontinued, for hx of DVTs. Patient is unsure. Was bleeding actively, has slowed at this time, but saturated through dressing since this morning. On oral ABX for the abscess at this time.   Wound Check      Past Medical History:  Diagnosis Date   Alcohol abuse    Alcoholic cirrhosis ([place])    Anemia    Anxiety    Arthritis    "knees" (11/26/2018)   B12 deficiency    CAD (coronary artery disease)    a. 11/10/2014 Cath: LM nl, LAD min irregs, D1 30 ost, D2 50d, LCX 55m, OM1 80 p/m (1.5 mm vessel), OM2 77m, RCA nondom 51m-->med rx.. Demand ischemia in the setting of rapid a-fib.   Cardiomyopathy ([place])    Carotid artery disease ([place])    a. 01/2015 Carotid Angio: RICA 361, LICA 44R; b. 11/5398 s/p L CEA; c. 05/2019 Carotid U/S: RICA 100. RECA >50. LICA 8-67%.   Cellulitis    lower extremities   CHF (congestive heart failure) ([name])    Chronic combined systolic and diastolic CHF (congestive heart failure) ([place])    a. 10/2014 Echo: EF 40-45%; b. 10/2018 Echo: EF 45-50%, gr2 DD; c. 11/2019 Echo: EF 50%, mild LVH, gr2 DD (restrictive), antlat HK, Nl RV fxn. Mild BAE. RVSP 59.30mmHg.   CKD (chronic kidney disease), stage III ([name])    Cocaine abuse ([name])    COPD (chronic obstructive pulmonary disease) ([name])    Critical lower limb ischemia ([name])    Depression    Diabetes mellitus  without complication ([name])    Diabetic peripheral neuropathy ([name])    DVT (deep venous thrombosis) ([name])    Dyspnea    Elevated troponin    a. Chronic elevation.   GERD (gastroesophageal reflux disease)    Hyperlipemia    Hypertension    Hypokalemia    Hypomagnesemia    Hypothyroidism    Marijuana abuse    Narcotic abuse ([place])    Noncompliance    NSVT (nonsustained ventricular tachycardia)    Obesity    PAF (paroxysmal atrial fibrillation) ([name])    Paroxysmal atrial tachycardia ([name])    Peripheral arterial disease ([place])    a. 01/2015 Angio/PTA: RSFA 99 (atherectomy/pta) - 1 vessel runoff via diff dzs peroneal; b. 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   Pneumonia    "once or twice" (11/26/2018)   Poorly controlled type 2 diabetes mellitus ([place])    Renal disorder    Renal insufficiency    a. 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   Sleep apnea    "couldn't handle wearing the mask" (11/26/2018)   Symptomatic bradycardia    a. Avoid AV blocking agent per EP. Prev req temp wire in 2017.   Tobacco abuse     Patient Active Problem List   Diagnosis Date Noted   Hyperlipidemia  associated with type 2 diabetes mellitus ([place]) 07/29/2021   (HFpEF) heart failure with preserved ejection fraction ([place]) 07/29/2021   Bradycardia 06/13/2021   Uncontrolled type 2 diabetes mellitus with hyperglycemia ([place]) 06/13/2021   Acute kidney injury superimposed on CKD ([place]) 06/13/2021   Critical ischemia of foot ([place]) 06/07/2021   Fall 05/05/2021   PAF (paroxysmal atrial fibrillation) ([place]) 05/05/2021   COPD (chronic obstructive pulmonary disease) ([place]) 05/05/2021   DM (diabetes mellitus), type 2 with complications ([place]) 79/39/0300   PAD (peripheral artery disease) ([place]) 05/05/2021   Cellulitis 05/05/2021   Acute on chronic combined systolic and diastolic CHF (congestive heart failure) ([place]) 05/05/2021   OSA (obstructive sleep apnea) 05/05/2021   CKD (chronic kidney disease) stage 3, GFR  30-59 ml/min ([place]) 05/05/2021   AKI (acute kidney injury) ([place]) 05/04/2021   Pressure injury of skin 05/04/2021   Ulcer of foot, unspecified laterality, limited to breakdown of skin ([place]) 02/17/2021   Altered mental status 01/02/2021   AF (paroxysmal atrial fibrillation) ([place]) 01/02/2021   CAD (coronary artery disease) 01/02/2021   PVD (peripheral vascular disease) ([place]) 01/02/2021   Acute renal failure superimposed on stage 4 chronic kidney disease ([place]) 01/02/2021   Diabetes mellitus type 2, uncontrolled, with complications 92/33/0076   Diabetic nephropathy ([place]) 01/02/2021   Low back pain 06/01/2020   Hyperlipidemia 04/03/2020   Muscle weakness 03/20/2020   Closed fracture of neck of right radius 03/13/2020   Pain in elbow 03/12/2020   PAD (peripheral artery disease) ([place]) 12/26/2019   Acute renal failure ([name])    Acute respiratory failure with hypoxia ([place]) 12/02/2019   Acute exacerbation of CHF (congestive heart failure) ([place]) 12/01/2019   Cellulitis in diabetic foot ([place]) 07/08/2019   Osteomyelitis ([place]) 06/21/2019   NICM (nonischemic cardiomyopathy) ([place]) 06/20/2019   Non-healing ulcer ([place]) 06/20/2019   Acute CHF (congestive heart failure) ([place]) 11/26/2018   CHF (congestive heart failure) ([place]) 11/26/2018   Acute respiratory failure ([place]) 10/21/2018   AKI (acute kidney injury) ([place]) 08/18/2018   Coagulation disorder ([place]) 08/09/2017   Depression 07/21/2017   At risk for adverse drug reaction 06/20/2017   Peripheral neuropathy 06/20/2017   Acute osteomyelitis of right foot ([place]) 06/13/2017   S/P transmetatarsal amputation of foot, right ([place]) 06/05/2017   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 ([place]) 05/19/2017   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[place])    SIRS (systemic inflammatory response syndrome) ([place]) 04/06/2017   CKD (chronic kidney disease), stage III ([place]) 11/24/2016   Suspected sleep apnea 11/24/2016   Ulcer of  toe of right foot, with necrosis of bone ([place]) 10/27/2016   Ulcer of left lower leg ([place]) 05/19/2016   Severe obesity (BMI >= 40) ([place]) 02/24/2016   COPD GOLD 0  02/24/2016   Morbid obesity ([place]) 02/24/2016   Encounter for therapeutic drug monitoring 02/10/2016   Symptomatic bradycardia 01/12/2016   Hypertension associated with diabetes ([place]) 12/22/2015   Chronic combined systolic and diastolic CHF (congestive heart failure) ([name])    [place]    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 11/08/2015   Tobacco abuse 10/23/2015   Coronary artery disease    DOE (dyspnea on exertion) 04/29/2015   Diabetes mellitus type 2, uncontrolled 02/08/2015   Influenza A 02/07/2015   Wrist fracture, left, with routine healing, subsequent encounter 02/05/2015   Wrist fracture, left, closed, initial encounter 01/29/2015   PAF (paroxysmal atrial fibrillation) ([place]) 01/16/2015   Carotid arterial disease ([place]) 01/16/2015   Claudication ([place]) 01/15/2015   Demand  ischemia ([place]) 10/29/2014   Insomnia 02/03/2014   COPD with acute exacerbation ([place]) 02/01/2014   S/P peripheral artery angioplasty - [place]bo[place] atherectomy; R SFA 09/11/2013    Class: Acute   Leg pain, bilateral 08/19/2013   Hypothyroidism 07/31/2013   Cellulitis 06/13/2013   History of cocaine abuse ([place]) 06/13/2013   Long term current use of anticoagulant therapy 05/20/2013   Alcohol abuse    Narcotic abuse ([place])    Marijuana abuse    Alcoholic cirrhosis ([place])    DM (diabetes mellitus), type 2 with peripheral vascular complications [facility])     Past Surgical History:  Procedure Laterality Date   ABDOMINAL AORTOGRAM N/A 06/26/2019   Procedure: ABDOMINAL AORTOGRAM;  Surgeon: Wellington Hampshire, MD;  Location: [facility] CV LAB;  Service: Cardiovascular;  Laterality: N/A;   ABDOMINAL AORTOGRAM W/LOWER EXTREMITY N/A 06/07/2021   Procedure: ABDOMINAL AORTOGRAM W/LOWER EXTREMITY;  Surgeon: [name], MD;  Location: [facility] CV LAB;  Service: Cardiovascular;   Laterality: N/A;   AMPUTATION Right 06/14/2017   Procedure: Right foot transmetatarsal amputation;  Surgeon: Newt Minion, MD;  Location: [facility];  Service: Orthopedics;  Laterality: Right;   AMPUTATION Left 06/28/2019   Procedure: AMPUTATION LEFT FIFTH TOE;  Surgeon: Rosetta Posner, MD;  Location: [facility];  Service: Vascular;  Laterality: Left;   AMPUTATION TOE Right 04/28/2017   Procedure: AMPUTATION OF RIGHT SECOND RAY;  Surgeon: Newt Minion, MD;  Location: [facility];  Service: Orthopedics;  Laterality: Right;   CARDIAC CATHETERIZATION     CARDIAC CATHETERIZATION N/A 01/12/2016   Procedure: Temporary Wire;  Surgeon: Minus Breeding, MD;  Location: [facility] CV LAB;  Service: Cardiovascular;  Laterality: N/A;   CARDIOVERSION  ~ 02/2013   "twice"    CAROTID ANGIOGRAM N/A 01/15/2015   Procedure: CAROTID ANGIOGRAM;  Surgeon: [name], MD;  Location[facility] CATH LAB;  Service: Cardiovascular;  Laterality: N/A;   DILATION AND CURETTAGE OF UTERUS  1988   ENDARTERECTOMY Left 02/19/2015   Procedure: LEFT CAROTID ENDARTERECTOMY ;  Surgeon: Serafina Mitchell, MD;  Location: [facility];  Service: Vascular;  Laterality: Left;   FEMORAL-TIBIAL BYPASS GRAFT Left 06/28/2019   Procedure: BYPASS GRAFT LEFT LEG FEMORAL TO ANTERIOR TIBIAL ARTERY using LEFT GREATER SAPHENOUS VEIN;  Surgeon: Rosetta Posner, MD;  Location: [facility];  Service: Vascular;  Laterality: Left;   LEFT HEART CATHETERIZATION WITH CORONARY ANGIOGRAM N/A 10/31/2014   Procedure: LEFT HEART CATHETERIZATION WITH CORONARY ANGIOGRAM;  Surgeon: Burnell Blanks, MD;  Location: [facility] CATH LAB;  Service: Cardiovascular;  Laterality: N/A;   LOWER EXTREMITY ANGIOGRAM N/A 09/10/2013   Procedure: LOWER EXTREMITY ANGIOGRAM;  Surgeon: [name], MD;  Location: [facility] CATH LAB;  Service: Cardiovascular;  Laterality: N/A;   LOWER EXTREMITY ANGIOGRAM N/A 01/15/2015   Procedure: LOWER EXTREMITY ANGIOGRAM;  Surgeon: [name], MD;  Location: [facility] CATH LAB;  Service:  Cardiovascular;  Laterality: N/A;   LOWER EXTREMITY ANGIOGRAPHY N/A 04/13/2017   Procedure: Lower Extremity Angiography;  Surgeon: [name], MD;  Location: [facility] CV LAB;  Service: Cardiovascular;  Laterality: N/A;   LOWER EXTREMITY ANGIOGRAPHY Left 06/26/2019   Procedure: LOWER EXTREMITY ANGIOGRAPHY;  Surgeon: Wellington Hampshire, MD;  Location: [facility] CV LAB;  Service: Cardiovascular;  Laterality: Left;   PERIPHERAL VASCULAR ATHERECTOMY Right 06/07/2021   Procedure: PERIPHERAL VASCULAR ATHERECTOMY;  Surgeon: [name], MD;  Location: [facility] CV LAB;  Service: Cardiovascular;  Laterality: Right;   PERIPHERAL  VASCULAR BALLOON ANGIOPLASTY Left 06/26/2019   Procedure: PERIPHERAL VASCULAR BALLOON ANGIOPLASTY;  Surgeon: Wellington Hampshire, MD;  Location: [facility] CV LAB;  Service: Cardiovascular;  Laterality: Left;  unable to cross lt sfa occlusion   PERIPHERAL VASCULAR INTERVENTION  04/13/2017   Procedure: Peripheral Vascular Intervention;  Surgeon: [name], MD;  Location: [facility] CV LAB;  Service: Cardiovascular;;   PRESSURE SENSOR/CARDIOMEMS N/A 02/05/2020   Procedure: PRESSURE SENSOR/CARDIOMEMS;  Surgeon: Larey Dresser, MD;  Location: [facility] CV LAB;  Service: Cardiovascular;  Laterality: N/A;   VEIN HARVEST Left 06/28/2019   Procedure: LEFT LEG GREATER SAPHENOUS VEIN HARVEST;  Surgeon: Rosetta Posner, MD;  Location: [facility] OR;  Service: Vascular;  Laterality: Left;     OB History     Gravida  1   Para  1   Term  1   Preterm  0   AB  0   Living         SAB  0   IAB  0   Ectopic  0   Multiple      Live Births              Family History  Problem Relation Age of Onset   Hypertension Mother    Diabetes Mother    Cancer Mother        breast, ovarian, colon   Clotting disorder Mother    Heart disease Mother    Heart attack Mother    Breast cancer Mother        in 64's   Hypertension Father    Heart disease Father     Emphysema Sister        smoked    Social History   Tobacco Use   Smoking status: Every Day    Packs/day: 1.00    Years: 44.00    Pack years: 44.00    Types: E-cigarettes, Cigarettes   Smokeless tobacco: Former    Types: Snuff  Vaping Use   Vaping Use: Former   Devices: 11/26/2018 "stopped months ago"  Substance Use Topics   Alcohol use: Yes    Comment: occ   Drug use: Yes    Types: "Crack" cocaine, Marijuana, Oxycodone    Home Medications Prior to Admission medications   Medication Sig Start Date End Date Taking? Authorizing Provider  ACCU-CHEK GUIDE test strip USE TO CHECK BLOOD SUGAR FOUR TIMES DAILY 02/25/20   [provider]  acetaminophen (TYLENOL) 500 MG tablet Take 500 mg by mouth every 6 (six) hours as needed for moderate pain.    [provider]  albuterol (VENTOLIN HFA) 108 (90 Base) MCG/ACT inhaler Inhale 2 puffs into the lungs every 6 (six) hours as needed for wheezing or shortness of breath. 07/30/21   Danford, Suann Larry, MD  allopurinol (ZYLOPRIM) 100 MG tablet Take 1 tablet (100 mg total) by mouth daily. 08/12/21   Charlott Rakes, MD  amiodarone (PACERONE) 200 MG tablet TAKE 1/2 TABLET (100 MG TOTAL) BY MOUTH DAILY. 09/13/21   Larey Dresser, MD  amLODipine (NORVASC) 10 MG tablet Take 1 tablet (10 mg total) by mouth daily. 10/21/21   Larey Dresser, MD  apixaban (ELIQUIS) 5 MG TABS tablet Take 5 mg by mouth 2 (two) times daily.    [provider]  atorvastatin (LIPITOR) 40 MG tablet Take 1 tablet (40 mg total) by mouth every evening. 10/14/21   Charlott Rakes, MD  benzonatate (TESSALON) 100 MG capsule Take 1 capsule (100  mg total) by mouth 2 (two) times daily as needed for cough. 09/23/21   Argentina Donovan, PA-C  budesonide-formoterol (SYMBICORT) 160-4.5 MCG/ACT inhaler Inhale 2 puffs into the lungs 2 (two) times daily. 12/09/19   Eugenie Filler, MD  buPROPion (WELLBUTRIN SR) 150 MG 12 hr tablet Take 1 tablet by mouth daily for 3  days then increase to 1 tablet by mouth 2 times daily. 10/21/21   Larey Dresser, MD  clopidogrel (PLAVIX) 75 MG tablet Take 1 tablet (75 mg total) by mouth daily. 10/05/21   Bensimhon, Shaune Pascal, MD  colchicine 0.6 MG tablet Take 2 tabs (1.2 mg) at the onset of a gout flare, may take 1 tab (0.6 mg) in 1 hour if symptoms 08/11/21   Charlott Rakes, MD  Continuous Blood Gluc Sensor (FREESTYLE LIBRE 2 SENSOR) MISC USE 1 (ONE) EACH EVERY 2 WEEKS Patient not taking: Reported on 10/21/2021 07/14/21   Charlott Rakes, MD  Dulaglutide (TRULICITY) 1.5 IO/2.7OJ SOPN Inject 1.5 mg into the skin once a week. 08/11/21   Charlott Rakes, MD  empagliflozin (JARDIANCE) 10 MG TABS tablet Take 1 tablet (10 mg total) by mouth daily before breakfast. 04/01/21   Clegg, [name] D, NP  ferrous sulfate 324 MG TBEC Take 324 mg by mouth.    [provider]  FLUoxetine (PROZAC) 20 MG capsule Take 1 capsule (20 mg total) by mouth at bedtime. 07/14/21   Charlott Rakes, MD  folic acid (FOLVITE) 1 MG tablet Take 1 tablet (1 mg total) by mouth daily. 07/14/21   Charlott Rakes, MD  hydrALAZINE (APRESOLINE) 50 MG tablet Take 1 tablet (50 mg total) by mouth 3 (three) times daily. 08/11/21   Charlott Rakes, MD  HYDROcodone-acetaminophen (NORCO/VICODIN) 5-325 MG tablet Take 1 tablet by mouth every 4 (four) hours as needed. 10/19/21   Isla Pence, MD  insulin glargine (LANTUS) 100 UNIT/ML Solostar Pen Inject 30 Units into the skin daily. 07/14/21 07/14/22  Charlott Rakes, MD  insulin lispro (HUMALOG) 100 UNIT/ML KwikPen Inject 8 Units into the skin 3 (three) times daily with meals. 01/05/21   Allie Bossier, MD  Insulin Pen Needle 32G X 4 MM MISC USE AS DIRECTED WITH INSULIN PENS 01/05/21 01/05/22  Allie Bossier, MD  levothyroxine (SYNTHROID) 50 MCG tablet Take 1 tablet (50 mcg total) by mouth daily. 10/11/21   Argentina Donovan, PA-C  losartan (COZAAR) 25 MG tablet Take 2 tablets (50 mg total) by mouth daily. Patient taking  differently: Take 50 mg by mouth at bedtime. 08/16/21 11/14/21  Rafael Bihari, FNP  Misc. Devices MISC 2 L oxygen continuously via nasal cannula.  Diagnosis chronic respiratory failure with hypoxia Patient not taking: Reported on 09/02/2021 08/19/21   Charlott Rakes, MD  Omega-3 Fatty Acids (FISH OIL PO) Take 1 capsule by mouth daily.    [provider]  ondansetron (ZOFRAN) 4 MG tablet Take 1 tablet (4 mg total) by mouth every 8 (eight) hours as needed for nausea or vomiting. 08/11/21   Charlott Rakes, MD  pantoprazole (PROTONIX) 40 MG tablet Take 1 tablet (40 mg total) by mouth daily. 10/20/21   Charlott Rakes, MD  potassium chloride SA (KLOR-CON M20) 20 MEQ tablet Take 1 tablet (20 mEq total) by mouth daily. 10/21/21 01/19/22  Larey Dresser, MD  torsemide (DEMADEX) 20 MG tablet Take 4 tablets (80 mg total) by mouth 2 (two) times daily. 10/21/21   Larey Dresser, MD  VITAMIN D PO Take 1 tablet by mouth  daily.    [provider]  tiotropium (SPIRIVA HANDIHALER) 18 MCG inhalation capsule Place 1 capsule (18 mcg total) into inhaler and inhale every morning. Patient not taking: Reported on 02/09/2021 12/09/19 02/09/21  Eugenie Filler, MD    Allergies    Gabapentin and Lyrica [pregabalin]  Review of Systems   Review of Systems  Skin:  Positive for wound.  All other systems reviewed and are negative.  Physical Exam Updated Vital Signs BP 128/72 (BP Location: Right Arm)    Pulse 82    Temp 98 F (36.7 C) (Oral)    Resp 16    LMP  (LMP Unknown)    SpO2 99%   Physical Exam Vitals and nursing note reviewed.  Constitutional:      General: She is not in acute distress.    Appearance: Normal appearance.  HENT:     Head: Normocephalic and atraumatic.  Eyes:     General:        Right eye: No discharge.        Left eye: No discharge.  Cardiovascular:     Rate and Rhythm: Normal rate and regular rhythm.  Pulmonary:     Effort: Pulmonary effort is normal. No respiratory  distress.  Musculoskeletal:        General: No deformity.  Skin:    General: Skin is warm and dry.     Comments: With approximately 2 cm linear wound on the left leg with loose skin in the surrounding area.  This is consistent with a abscess that was drained yesterday.  Wound is oozing blood very slowly.  There is no evidence of redness, purulent drainage.  Neurological:     Mental Status: She is alert and oriented to person, place, and time.  Psychiatric:        Mood and Affect: Mood normal.        Behavior: Behavior normal.    ED Results / Procedures / Treatments   Labs (all labs ordered are listed, but only abnormal results are displayed) Labs Reviewed  CBC WITH DIFFERENTIAL/PLATELET - Abnormal; Notable for the following components:      Result Value   RBC 3.68 (*)    Hemoglobin 9.8 (*)    HCT 32.4 (*)    RDW 18.6 (*)    All other components within normal limits  PROTIME-INR  APTT    EKG None  Radiology No results found.  Procedures Procedures   Medications Ordered in ED Medications - No data to display  ED Course  I have reviewed the triage vital signs and the nursing notes.  Pertinent labs & imaging results that were available during my care of the patient were reviewed by me and considered in my medical decision making (see chart for details).    MDM Rules/Calculators/A&P                         Patient with wound consistent with drained abscess with ongoing bleeding, oozing from wound. On multiple blood thinners. Labwork unremarkable other than mild anemia. Slightly decreased from last value but not at critical level. Patient with improvement of bleeding with pressure dressing, however still seeping on re-evaluation. Do not believe that this would benefit from figure-of-eight stitch especially as it was a recently drained abscess, I do not want to sequester infection in the area.  We rebandaged the area with a pressure banding using combat gauze, wrapped with  [place].  Bleeding controlled at  time of discharge.  Patient encouraged to follow-up with her wound care clinic in the morning, and given some supplies for 3 bandaging if she has ongoing bleeding tonight. Discharged in stable condition at this time. Final Clinical Impression(s) / ED Diagnoses Final diagnoses:  Bleeding from wound    Rx / DC Orders ED Discharge Orders     None        Dorien Chihuahua 10/27/21 1635    Milton Ferguson, MD 10/29/21 2338

## 2021-10-27 NOTE — ED Provider Notes (Signed)
Emergency Medicine Provider Triage Evaluation Note  [name] , a 60 y.o. female  was evaluated in triage.  Pt complains of left leg wound with previous cellulitis, abscess that was treated, now presenting for wound check of ongoing bleeding wound since yesterday. Hx of diabetes, on eliquis, maybe also plavix? For hx of DVTs. Patient is unsure. Was bleeding actively, has slowed at this time, but saturated through dressing since this morning.  Review of Systems  Positive: bleeding Negative: Lightheadedness, syncope, purulent drainage   Physical Exam  LMP  (LMP Unknown)  Gen:   Awake, no distress   Resp:  Normal effort  MSK:   Moves extremities without difficulty  Other:  Slowly weeping wound upper tibia anteriorly  Medical Decision Making  Medically screening exam initiated at 1:27 PM.  Appropriate orders placed.  MCKINZIE SAKSA was informed that the remainder of the evaluation will be completed by another provider, this initial triage assessment does not replace that evaluation, and the importance of remaining in the ED until their evaluation is complete.  Bleeding wound left shin -- pressure dressing re-applied in triage, does not appear to urgently need figure 8 at this time. Low pressure venous bleed   Dorien Chihuahua 10/27/21 1330    Milton Ferguson, MD 10/27/21 1620

## 2021-10-27 NOTE — ED Triage Notes (Signed)
Pt reports having a blister-like wound lanced yesterday and reports continuous bleeding since then. She arrives with a saturated dressing to her left shin. Upon removing dressing the bleeding has slowed.

## 2021-10-27 NOTE — ED Notes (Signed)
Patient has a light green in the main lab 

## 2021-10-27 NOTE — Discharge Instructions (Signed)
As we discussed, your wound is slowly bleeding, but is controlled at this time with pressure, I recommend leaving it bandaged with pressure dressing as instructed.  If concern for wound continuing to bleed, please follow up either here or with your wound clinic tomorrow.

## 2021-10-28 ENCOUNTER — Other Ambulatory Visit (HOSPITAL_COMMUNITY): Payer: Self-pay

## 2021-10-28 NOTE — Progress Notes (Signed)
Paramedicine Encounter    Patient ID: [name], female    DOB: 10-01-1961, 60 y.o.   MRN: [id number]   Patient Care Team: Charlott Rakes, MD as PCP - General (Family Medicine) [name], MD as PCP - Cardiology (Cardiology) [name], MD as PCP - Electrophysiology (Cardiology) [name], MD as Consulting Physician (Cardiology) [name], MD as Consulting Physician (Pulmonary Disease) System, Provider Not In  Patient Active Problem List   Diagnosis Date Noted   Hyperlipidemia associated with type 2 diabetes mellitus ([place]) 07/29/2021   (HFpEF) heart failure with preserved ejection fraction ([place]) 07/29/2021   Bradycardia 06/13/2021   Uncontrolled type 2 diabetes mellitus with hyperglycemia ([place]) 06/13/2021   Acute kidney injury superimposed on CKD ([place]) 06/13/2021   Critical ischemia of foot ([place]) 06/07/2021   Fall 05/05/2021   PAF (paroxysmal atrial fibrillation) ([place]) 05/05/2021   COPD (chronic obstructive pulmonary disease) ([place]) 05/05/2021   DM (diabetes mellitus), type 2 with complications ([place]) 26/71/2458   PAD (peripheral artery disease) ([place]) 05/05/2021   Cellulitis 05/05/2021   Acute on chronic combined systolic and diastolic CHF (congestive heart failure) ([place]) 05/05/2021   OSA (obstructive sleep apnea) 05/05/2021   CKD (chronic kidney disease) stage 3, GFR 30-59 ml/min ([place]) 05/05/2021   AKI (acute kidney injury) ([place]) 05/04/2021   Pressure injury of skin 05/04/2021   Ulcer of foot, unspecified laterality, limited to breakdown of skin ([place]) 02/17/2021   Altered mental status 01/02/2021   AF (paroxysmal atrial fibrillation) ([place]) 01/02/2021   CAD (coronary artery disease) 01/02/2021   PVD (peripheral vascular disease) ([place]) 01/02/2021   Acute renal failure superimposed on stage 4 chronic kidney disease ([place]) 01/02/2021   Diabetes mellitus type 2, uncontrolled, with complications 09/98/3382   Diabetic nephropathy ([place]) 01/02/2021   Low  back pain 06/01/2020   Hyperlipidemia 04/03/2020   Muscle weakness 03/20/2020   Closed fracture of neck of right radius 03/13/2020   Pain in elbow 03/12/2020   PAD (peripheral artery disease) ([place]) 12/26/2019   Acute renal failure ([place])    Acute respiratory failure with hypoxia ([place]) 12/02/2019   Acute exacerbation of CHF (congestive heart failure) ([place]) 12/01/2019   Cellulitis in diabetic foot ([place]) 07/08/2019   Osteomyelitis ([place]) 06/21/2019   NICM (nonischemic cardiomyopathy) ([place]) 06/20/2019   Non-healing ulcer ([place]) 06/20/2019   Acute CHF (congestive heart failure) ([place]) 11/26/2018   CHF (congestive heart failure) ([place]) 11/26/2018   Acute respiratory failure ([place]) 10/21/2018   AKI (acute kidney injury) ([place]) 08/18/2018   Coagulation disorder ([place]) 08/09/2017   Depression 07/21/2017   At risk for adverse drug reaction 06/20/2017   Peripheral neuropathy 06/20/2017   Acute osteomyelitis of right foot ([place]) 06/13/2017   S/P transmetatarsal amputation of foot, right ([place]) 06/05/2017   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 ([place]) 05/19/2017   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[place])    SIRS (systemic inflammatory response syndrome) ([place]) 04/06/2017   CKD (chronic kidney disease), stage III ([place]) 11/24/2016   Suspected sleep apnea 11/24/2016   Ulcer of toe of right foot, with necrosis of bone ([place]) 10/27/2016   Ulcer of left lower leg ([place]) 05/19/2016   Severe obesity (BMI >= 40) ([place]) 02/24/2016   COPD GOLD 0  02/24/2016   Morbid obesity ([employer]) 02/24/2016   Encounter for therapeutic drug monitoring 02/10/2016   Symptomatic bradycardia 01/12/2016   Hypertension associated with diabetes ([place]) 12/22/2015   Chronic combined systolic and diastolic CHF (congestive heart failure) (  [place])    [place]    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 11/08/2015   Tobacco abuse 10/23/2015   Coronary artery disease    DOE (dyspnea on exertion) 04/29/2015    Diabetes mellitus type 2, uncontrolled 02/08/2015   Influenza A 02/07/2015   Wrist fracture, left, with routine healing, subsequent encounter 02/05/2015   Wrist fracture, left, closed, initial encounter 01/29/2015   PAF (paroxysmal atrial fibrillation) ([place]) 01/16/2015   Carotid arterial disease ([place]) 01/16/2015   Claudication ([place]) 01/15/2015   Demand ischemia ([place]) 10/29/2014   Insomnia 02/03/2014   COPD with acute exacerbation ([place]) 02/01/2014   S/P peripheral artery angioplasty - TurboHawk atherectomy; R SFA 09/11/2013    Class: Acute   Leg pain, bilateral 08/19/2013   Hypothyroidism 07/31/2013   Cellulitis 06/13/2013   History of cocaine abuse ([place]) 06/13/2013   Long term current use of anticoagulant therapy 05/20/2013   Alcohol abuse    Narcotic abuse ([place])    Marijuana abuse    Alcoholic cirrhosis ([name])    DM (diabetes mellitus), type 2 with peripheral vascular complications ([name])     Current Outpatient Medications:    ACCU-CHEK GUIDE test strip, USE TO CHECK BLOOD SUGAR FOUR TIMES DAILY, Disp: , Rfl:    acetaminophen (TYLENOL) 500 MG tablet, Take 500 mg by mouth every 6 (six) hours as needed for moderate pain., Disp: , Rfl:    albuterol (VENTOLIN HFA) 108 (90 Base) MCG/ACT inhaler, Inhale 2 puffs into the lungs every 6 (six) hours as needed for wheezing or shortness of breath., Disp: 18 g, Rfl: 3   allopurinol (ZYLOPRIM) 100 MG tablet, Take 1 tablet (100 mg total) by mouth daily., Disp: 30 tablet, Rfl: 6   amiodarone (PACERONE) 200 MG tablet, TAKE 1/2 TABLET (100 MG TOTAL) BY MOUTH DAILY., Disp: 15 tablet, Rfl: 3   amLODipine (NORVASC) 10 MG tablet, Take 1 tablet (10 mg total) by mouth daily., Disp: 90 tablet, Rfl: 3   apixaban (ELIQUIS) 5 MG TABS tablet, Take 5 mg by mouth 2 (two) times daily., Disp: , Rfl:    atorvastatin (LIPITOR) 40 MG tablet, Take 1 tablet (40 mg total) by mouth every evening., Disp: 90 tablet, Rfl: 1   benzonatate (TESSALON) 100 MG capsule, Take 1  capsule (100 mg total) by mouth 2 (two) times daily as needed for cough., Disp: 40 capsule, Rfl: 0   budesonide-formoterol (SYMBICORT) 160-4.5 MCG/ACT inhaler, Inhale 2 puffs into the lungs 2 (two) times daily., Disp: 1 Inhaler, Rfl: 2   buPROPion (WELLBUTRIN SR) 150 MG 12 hr tablet, Take 1 tablet by mouth daily for 3 days then increase to 1 tablet by mouth 2 times daily., Disp: 60 tablet, Rfl: 2   clopidogrel (PLAVIX) 75 MG tablet, Take 1 tablet (75 mg total) by mouth daily., Disp: 30 tablet, Rfl: 11   colchicine 0.6 MG tablet, Take 2 tabs (1.2 mg) at the onset of a gout flare, may take 1 tab (0.6 mg) in 1 hour if symptoms, Disp: 30 tablet, Rfl: 3   Continuous Blood Gluc Sensor (FREESTYLE LIBRE 2 SENSOR) MISC, USE 1 (ONE) EACH EVERY 2 WEEKS, Disp: 2 each, Rfl: 11   Dulaglutide (TRULICITY) 1.5 OI/7.1IW SOPN, Inject 1.5 mg into the skin once a week., Disp: 2 mL, Rfl: 6   empagliflozin (JARDIANCE) 10 MG TABS tablet, Take 1 tablet (10 mg total) by mouth daily before breakfast., Disp: 90 tablet, Rfl: 3   ferrous sulfate 324 MG TBEC, Take 324 mg by mouth., Disp: , Rfl:  FLUoxetine (PROZAC) 20 MG capsule, Take 1 capsule (20 mg total) by mouth at bedtime., Disp: 90 capsule, Rfl: 1   folic acid (FOLVITE) 1 MG tablet, Take 1 tablet (1 mg total) by mouth daily., Disp: 90 tablet, Rfl: 1   hydrALAZINE (APRESOLINE) 50 MG tablet, Take 1 tablet (50 mg total) by mouth 3 (three) times daily., Disp: 90 tablet, Rfl: 6   HYDROcodone-acetaminophen (NORCO/VICODIN) 5-325 MG tablet, Take 1 tablet by mouth every 4 (four) hours as needed., Disp: 10 tablet, Rfl: 0   insulin glargine (LANTUS) 100 UNIT/ML Solostar Pen, Inject 30 Units into the skin daily., Disp: 30 mL, Rfl: 6   insulin lispro (HUMALOG) 100 UNIT/ML KwikPen, Inject 8 Units into the skin 3 (three) times daily with meals., Disp: 15 mL, Rfl: 0   Insulin Pen Needle 32G X 4 MM MISC, USE AS DIRECTED WITH INSULIN PENS, Disp: 100 each, Rfl: 0   levothyroxine (SYNTHROID)  50 MCG tablet, Take 1 tablet (50 mcg total) by mouth daily., Disp: 90 tablet, Rfl: 1   losartan (COZAAR) 25 MG tablet, Take 2 tablets (50 mg total) by mouth daily. (Patient taking differently: Take 50 mg by mouth at bedtime.), Disp: 180 tablet, Rfl: 1   Misc. Devices MISC, 2 L oxygen continuously via nasal cannula.  Diagnosis chronic respiratory failure with hypoxia, Disp: 1 each, Rfl: 0   Omega-3 Fatty Acids (FISH OIL PO), Take 1 capsule by mouth daily., Disp: , Rfl:    ondansetron (ZOFRAN) 4 MG tablet, Take 1 tablet (4 mg total) by mouth every 8 (eight) hours as needed for nausea or vomiting., Disp: 20 tablet, Rfl: 0   pantoprazole (PROTONIX) 40 MG tablet, Take 1 tablet (40 mg total) by mouth daily., Disp: 90 tablet, Rfl: 0   potassium chloride SA (KLOR-CON M20) 20 MEQ tablet, Take 1 tablet (20 mEq total) by mouth daily., Disp: 90 tablet, Rfl: 3   torsemide (DEMADEX) 20 MG tablet, Take 4 tablets (80 mg total) by mouth 2 (two) times daily., Disp: 240 tablet, Rfl: 11   VITAMIN D PO, Take 1 tablet by mouth daily., Disp: , Rfl:  Allergies  Allergen Reactions   Gabapentin Nausea And Vomiting and Other (See Comments)    POSSIBLE SHAKING   Lyrica [Pregabalin] Other (See Comments)    Shaking       Social History   Socioeconomic History   Marital status: Single    Spouse name: Not on file   Number of children: 1   Years of education: 12   Highest education level: 12th grade  Occupational History   Occupation: disabled  Tobacco Use   Smoking status: Every Day    Packs/day: 1.00    Years: 44.00    Pack years: 44.00    Types: E-cigarettes, Cigarettes   Smokeless tobacco: Former    Types: Snuff  Vaping Use   Vaping Use: Former   Devices: 11/26/2018 "stopped months ago"  Substance and Sexual Activity   Alcohol use: Yes    Comment: occ   Drug use: Yes    Types: "Crack" cocaine, Marijuana, Oxycodone   Sexual activity: Not Currently  Other Topics Concern   Not on file  Social History  Narrative   ** Merged History Encounter **       Lives in [place], in motel with sister.  They are looking to move but don't have a place to go yet.     Social Determinants of Health   Financial Resource Strain: Not on file  Food Insecurity: Not on file  Transportation Needs: Not on file  Physical Activity: Not on file  Stress: Not on file  Social Connections: Not on file  Intimate Partner Violence: Not on file    Physical Exam      Future Appointments  Date Time Provider [place]  11/01/2021  2:30 PM [facility] LAB [facility] None  11/02/2021  2:00 PM Ricard Dillon, MD [facility]  11/16/2021  3:30 PM Charlott Rakes, MD [facility] None  12/03/2021  2:00 PM [facility] PA/NP [facility] None  12/22/2021  1:00 PM [facility] VASC 3 [facility]  01/11/2022  3:30 PM [name], MD [facility]    BP (!) 164/68    Pulse 97    Resp 20    Wt 208 lb (94.3 kg)    LMP  (LMP Unknown)    SpO2 93%    BMI 35.70 kg/m   Weight yesterday-? Last visit weight-213   Pt reports she has been ok-everyone in the house is sick with possible flu.  She reports nausea every day. She has been having this going on for quite some time now. She may need referral to GI at some point.  She had to go to ER yesterday due to her leg bleeding.  She started the wellbutrin, potassium and increased dose of torsemide.  Weight is down.  She denies c/p, no increased sob, no dizziness.  Mainly feet pain.  Her lungs sound awful. She has not used her inhaler yet this morning.  She reports she only has smoked 2 packs of cigarettes this past week-that is down from what she normally does.  I was going to top off her other pill box but she couldn't find it.   Hoping to get her to bubble packs in the next few wks anyways.    Refills needed- Torsemide-went ahead and req for pharmacy to fill that.  Will come back out here next week.    Marylouise Stacks, [facility] Paramedic   10/28/21

## 2021-11-01 ENCOUNTER — Other Ambulatory Visit (HOSPITAL_COMMUNITY): Payer: Medicare Other

## 2021-11-01 LAB — AEROBIC/ANAEROBIC CULTURE W GRAM STAIN (SURGICAL/DEEP WOUND)

## 2021-11-02 ENCOUNTER — Other Ambulatory Visit: Payer: Self-pay

## 2021-11-02 ENCOUNTER — Ambulatory Visit (HOSPITAL_COMMUNITY)
Admission: RE | Admit: 2021-11-02 | Discharge: 2021-11-02 | Disposition: A | Discharge status: Home / Self Care | Payer: Medicare Other | Source: Ambulatory Visit | Attending: Internal Medicine | Admitting: Internal Medicine

## 2021-11-02 ENCOUNTER — Encounter (HOSPITAL_BASED_OUTPATIENT_CLINIC_OR_DEPARTMENT_OTHER): Payer: Medicare Other | Admitting: Internal Medicine

## 2021-11-02 DIAGNOSIS — I5042 Chronic combined systolic (congestive) and diastolic (congestive) heart failure: Secondary | ICD-10-CM | POA: Diagnosis not present

## 2021-11-02 LAB — BASIC METABOLIC PANEL
Anion gap: 12 (ref 5–15)
BUN: 36 mg/dL — ABNORMAL HIGH (ref 6–20)
CO2: 25 mmol/L (ref 22–32)
Calcium: 8.6 mg/dL — ABNORMAL LOW (ref 8.9–10.3)
Chloride: 99 mmol/L (ref 98–111)
Creatinine, Ser: 1.81 mg/dL — ABNORMAL HIGH (ref 0.44–1.00)
GFR, Estimated: 32 mL/min — ABNORMAL LOW (ref >60)
Glucose, Bld: 254 mg/dL — ABNORMAL HIGH (ref 70–99)
Potassium: 4.1 mmol/L (ref 3.5–5.1)
Sodium: 136 mmol/L (ref 135–145)

## 2021-11-03 ENCOUNTER — Telehealth (HOSPITAL_COMMUNITY): Payer: Self-pay

## 2021-11-03 NOTE — Telephone Encounter (Signed)
Attempted to reach [name] for scheduling home visit for tomorrow with no success. I plan to show up to see her at 1:00 tomorrow.

## 2021-11-04 ENCOUNTER — Other Ambulatory Visit (HOSPITAL_COMMUNITY): Payer: Self-pay

## 2021-11-04 NOTE — Progress Notes (Signed)
Paramedicine Encounter    Patient ID: [name], female    DOB: 02-Jan-1961, 60 y.o.   MRN: [id number]   Arrived for home visit for [facility] who was alert and oriented, short of breath while walking around reporting to be feeling okay just having some stomach pain. This stomach pain has been ongoing for several weeks now.   Assessment and vitals as noted in report.  I reviewed chart and medications. Pill box filled accordingly for one week.   CBG- 384   Refills:[CALLED IN TO [facility] ON 12/22] Plavix (will need Weds, Thurs MORN) Allupurinol Humalog     Patient Care Team: Charlott Rakes, MD as PCP - General (Family Medicine) [name], MD as PCP - Cardiology (Cardiology) [name], MD as PCP - Electrophysiology (Cardiology) [name], MD as Consulting Physician (Cardiology) [name], MD as Consulting Physician (Pulmonary Disease) System, Provider Not In  Patient Active Problem List   Diagnosis Date Noted   Hyperlipidemia associated with type 2 diabetes mellitus ([place]) 07/29/2021   (HFpEF) heart failure with preserved ejection fraction ([place]) 07/29/2021   Bradycardia 06/13/2021   Uncontrolled type 2 diabetes mellitus with hyperglycemia ([place]) 06/13/2021   Acute kidney injury superimposed on CKD ([place]) 06/13/2021   Critical ischemia of foot ([place]) 06/07/2021   Fall 05/05/2021   PAF (paroxysmal atrial fibrillation) ([place]) 05/05/2021   COPD (chronic obstructive pulmonary disease) ([place]) 05/05/2021   DM (diabetes mellitus), type 2 with complications ([place]) 20/25/4270   PAD (peripheral artery disease) ([place]) 05/05/2021   Cellulitis 05/05/2021   Acute on chronic combined systolic and diastolic CHF (congestive heart failure) ([place]) 05/05/2021   OSA (obstructive sleep apnea) 05/05/2021   CKD (chronic kidney disease) stage 3, GFR 30-59 ml/min ([place]) 05/05/2021   AKI (acute kidney injury) ([place]) 05/04/2021   Pressure injury of skin 05/04/2021   Ulcer of foot,  unspecified laterality, limited to breakdown of skin ([place]) 02/17/2021   Altered mental status 01/02/2021   AF (paroxysmal atrial fibrillation) ([place]) 01/02/2021   CAD (coronary artery disease) 01/02/2021   PVD (peripheral vascular disease) ([place]) 01/02/2021   Acute renal failure superimposed on stage 4 chronic kidney disease ([place]) 01/02/2021   Diabetes mellitus type 2, uncontrolled, with complications 62/37/6283   Diabetic nephropathy ([place]) 01/02/2021   Low back pain 06/01/2020   Hyperlipidemia 04/03/2020   Muscle weakness 03/20/2020   Closed fracture of neck of right radius 03/13/2020   Pain in elbow 03/12/2020   PAD (peripheral artery disease) ([place]) 12/26/2019   Acute renal failure ([name])    Acute respiratory failure with hypoxia ([place]) 12/02/2019   Acute exacerbation of CHF (congestive heart failure) ([place]) 12/01/2019   Cellulitis in diabetic foot ([place]) 07/08/2019   Osteomyelitis ([place]) 06/21/2019   NICM (nonischemic cardiomyopathy) ([place]) 06/20/2019   Non-healing ulcer ([place]) 06/20/2019   Acute CHF (congestive heart failure) ([place]) 11/26/2018   CHF (congestive heart failure) ([place]) 11/26/2018   Acute respiratory failure ([place]) 10/21/2018   AKI (acute kidney injury) ([name]) 08/18/2018   Coagulation disorder ([place]) 08/09/2017   Depression 07/21/2017   At risk for adverse drug reaction 06/20/2017   Peripheral neuropathy 06/20/2017   Acute osteomyelitis of right foot ([place]) 06/13/2017   S/P transmetatarsal amputation of foot, right ([place]) 06/05/2017   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 ([place]) 05/19/2017   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[place])    SIRS (systemic inflammatory response syndrome) ([place]) 04/06/2017   CKD (chronic kidney disease), stage III ([place]) 11/24/2016  Suspected sleep apnea 11/24/2016   Ulcer of toe of right foot, with necrosis of bone ([name]) 10/27/2016   Ulcer of left lower leg ([place]) 05/19/2016   Severe obesity (BMI >= 40)  ([place]) 02/24/2016   COPD GOLD 0  02/24/2016   Morbid obesity ([place]) 02/24/2016   Encounter for therapeutic drug monitoring 02/10/2016   Symptomatic bradycardia 01/12/2016   Hypertension associated with diabetes ([place]) 12/22/2015   Chronic combined systolic and diastolic CHF (congestive heart failure) ([name])    [place]    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 11/08/2015   Tobacco abuse 10/23/2015   Coronary artery disease    DOE (dyspnea on exertion) 04/29/2015   Diabetes mellitus type 2, uncontrolled 02/08/2015   Influenza A 02/07/2015   Wrist fracture, left, with routine healing, subsequent encounter 02/05/2015   Wrist fracture, left, closed, initial encounter 01/29/2015   PAF (paroxysmal atrial fibrillation) ([place]) 01/16/2015   Carotid arterial disease ([place]) 01/16/2015   Claudication ([place]) 01/15/2015   Demand ischemia ([place]) 10/29/2014   Insomnia 02/03/2014   COPD with acute exacerbation ([place]) 02/01/2014   S/P peripheral artery angioplasty - [place]bo[place] atherectomy; R SFA 09/11/2013    Class: Acute   Leg pain, bilateral 08/19/2013   Hypothyroidism 07/31/2013   Cellulitis 06/13/2013   History of cocaine abuse ([employer]) 06/13/2013   Long term current use of anticoagulant therapy 05/20/2013   Alcohol abuse    Narcotic abuse ([place])    Marijuana abuse    Alcoholic cirrhosis ([name])    DM (diabetes mellitus), type 2 with peripheral vascular complications ([name])     Current Outpatient Medications:    ACCU-CHEK GUIDE test strip, USE TO CHECK BLOOD SUGAR FOUR TIMES DAILY, Disp: , Rfl:    acetaminophen (TYLENOL) 500 MG tablet, Take 500 mg by mouth every 6 (six) hours as needed for moderate pain., Disp: , Rfl:    albuterol (VENTOLIN HFA) 108 (90 Base) MCG/ACT inhaler, Inhale 2 puffs into the lungs every 6 (six) hours as needed for wheezing or shortness of breath., Disp: 18 g, Rfl: 3   allopurinol (ZYLOPRIM) 100 MG tablet, Take 1 tablet (100 mg total) by mouth daily., Disp: 30 tablet, Rfl: 6   amiodarone (PACERONE)  200 MG tablet, TAKE 1/2 TABLET (100 MG TOTAL) BY MOUTH DAILY., Disp: 15 tablet, Rfl: 3   amLODipine (NORVASC) 10 MG tablet, Take 1 tablet (10 mg total) by mouth daily., Disp: 90 tablet, Rfl: 3   apixaban (ELIQUIS) 5 MG TABS tablet, Take 5 mg by mouth 2 (two) times daily., Disp: , Rfl:    atorvastatin (LIPITOR) 40 MG tablet, Take 1 tablet (40 mg total) by mouth every evening., Disp: 90 tablet, Rfl: 1   benzonatate (TESSALON) 100 MG capsule, Take 1 capsule (100 mg total) by mouth 2 (two) times daily as needed for cough., Disp: 40 capsule, Rfl: 0   budesonide-formoterol (SYMBICORT) 160-4.5 MCG/ACT inhaler, Inhale 2 puffs into the lungs 2 (two) times daily., Disp: 1 Inhaler, Rfl: 2   buPROPion (WELLBUTRIN SR) 150 MG 12 hr tablet, Take 1 tablet by mouth daily for 3 days then increase to 1 tablet by mouth 2 times daily., Disp: 60 tablet, Rfl: 2   clopidogrel (PLAVIX) 75 MG tablet, Take 1 tablet (75 mg total) by mouth daily., Disp: 30 tablet, Rfl: 11   colchicine 0.6 MG tablet, Take 2 tabs (1.2 mg) at the onset of a gout flare, may take 1 tab (0.6 mg) in 1 hour if symptoms, Disp: 30 tablet, Rfl: 3   Continuous  Blood Gluc Sensor (FREESTYLE LIBRE 2 SENSOR) MISC, USE 1 (ONE) EACH EVERY 2 WEEKS, Disp: 2 each, Rfl: 11   Dulaglutide (TRULICITY) 1.5 AY/3.0ZS SOPN, Inject 1.5 mg into the skin once a week., Disp: 2 mL, Rfl: 6   empagliflozin (JARDIANCE) 10 MG TABS tablet, Take 1 tablet (10 mg total) by mouth daily before breakfast., Disp: 90 tablet, Rfl: 3   ferrous sulfate 324 MG TBEC, Take 324 mg by mouth., Disp: , Rfl:    FLUoxetine (PROZAC) 20 MG capsule, Take 1 capsule (20 mg total) by mouth at bedtime., Disp: 90 capsule, Rfl: 1   folic acid (FOLVITE) 1 MG tablet, Take 1 tablet (1 mg total) by mouth daily., Disp: 90 tablet, Rfl: 1   hydrALAZINE (APRESOLINE) 50 MG tablet, Take 1 tablet (50 mg total) by mouth 3 (three) times daily., Disp: 90 tablet, Rfl: 6   HYDROcodone-acetaminophen (NORCO/VICODIN) 5-325 MG  tablet, Take 1 tablet by mouth every 4 (four) hours as needed., Disp: 10 tablet, Rfl: 0   insulin glargine (LANTUS) 100 UNIT/ML Solostar Pen, Inject 30 Units into the skin daily., Disp: 30 mL, Rfl: 6   insulin lispro (HUMALOG) 100 UNIT/ML KwikPen, Inject 8 Units into the skin 3 (three) times daily with meals., Disp: 15 mL, Rfl: 0   Insulin Pen Needle 32G X 4 MM MISC, USE AS DIRECTED WITH INSULIN PENS, Disp: 100 each, Rfl: 0   levothyroxine (SYNTHROID) 50 MCG tablet, Take 1 tablet (50 mcg total) by mouth daily., Disp: 90 tablet, Rfl: 1   losartan (COZAAR) 25 MG tablet, Take 2 tablets (50 mg total) by mouth daily. (Patient taking differently: Take 50 mg by mouth at bedtime.), Disp: 180 tablet, Rfl: 1   Misc. Devices MISC, 2 L oxygen continuously via nasal cannula.  Diagnosis chronic respiratory failure with hypoxia, Disp: 1 each, Rfl: 0   Omega-3 Fatty Acids (FISH OIL PO), Take 1 capsule by mouth daily., Disp: , Rfl:    ondansetron (ZOFRAN) 4 MG tablet, Take 1 tablet (4 mg total) by mouth every 8 (eight) hours as needed for nausea or vomiting., Disp: 20 tablet, Rfl: 0   pantoprazole (PROTONIX) 40 MG tablet, Take 1 tablet (40 mg total) by mouth daily., Disp: 90 tablet, Rfl: 0   potassium chloride SA (KLOR-CON M20) 20 MEQ tablet, Take 1 tablet (20 mEq total) by mouth daily., Disp: 90 tablet, Rfl: 3   torsemide (DEMADEX) 20 MG tablet, Take 4 tablets (80 mg total) by mouth 2 (two) times daily., Disp: 240 tablet, Rfl: 11   VITAMIN D PO, Take 1 tablet by mouth daily., Disp: , Rfl:  Allergies  Allergen Reactions   Gabapentin Nausea And Vomiting and Other (See Comments)    POSSIBLE SHAKING   Lyrica [Pregabalin] Other (See Comments)    Shaking      Social History   Socioeconomic History   Marital status: Single    Spouse name: Not on file   Number of children: 1   Years of education: 12   Highest education level: 12th grade  Occupational History   Occupation: disabled  Tobacco Use   Smoking  status: Every Day    Packs/day: 1.00    Years: 44.00    Pack years: 44.00    Types: E-cigarettes, Cigarettes   Smokeless tobacco: Former    Types: Snuff  Vaping Use   Vaping Use: Former   Devices: 11/26/2018 "stopped months ago"  Substance and Sexual Activity   Alcohol use: Yes    Comment: occ  Drug use: Yes    Types: "Crack" cocaine, Marijuana, Oxycodone   Sexual activity: Not Currently  Other Topics Concern   Not on file  Social History Narrative   ** Merged History Encounter **       Lives in [place], in motel with sister.  They are looking to move but don't have a place to go yet.     Social Determinants of Health   Financial Resource Strain: Not on file  Food Insecurity: Not on file  Transportation Needs: Not on file  Physical Activity: Not on file  Stress: Not on file  Social Connections: Not on file  Intimate Partner Violence: Not on file    Physical Exam Vitals reviewed.  Constitutional:      Appearance: Normal appearance. She is obese.  HENT:     Head: Normocephalic.     Nose: Nose normal.     Mouth/Throat:     Mouth: Mucous membranes are moist.     Pharynx: Oropharynx is clear.  Eyes:     Conjunctiva/sclera: Conjunctivae normal.     Pupils: Pupils are equal, round, and reactive to light.  Cardiovascular:     Rate and Rhythm: Normal rate and regular rhythm.     Pulses: Normal pulses.     Heart sounds: Normal heart sounds.  Pulmonary:     Effort: Pulmonary effort is normal.     Breath sounds: Rhonchi present.  Abdominal:     Palpations: Abdomen is soft.  Musculoskeletal:        General: No swelling. Normal range of motion.     Cervical back: Normal range of motion.     Right lower leg: No edema.     Left lower leg: No edema.  Skin:    General: Skin is warm and dry.     Capillary Refill: Capillary refill takes less than 2 seconds.  Neurological:     General: No focal deficit present.     Mental Status: She is alert. Mental status is at baseline.   Psychiatric:        Mood and Affect: Mood normal.        Future Appointments  Date Time Provider [place]  11/11/2021  2:15 PM Ricard Dillon, MD [facility]  11/16/2021  3:30 PM Charlott Rakes, MD [facility] None  12/03/2021  2:00 PM [facility] PA/NP [facility] None  12/22/2021  1:00 PM [facility] VASC 3 [facility]  01/11/2022  3:30 PM [name], MD [facility]     ACTION: Home visit completed

## 2021-11-05 ENCOUNTER — Telehealth (HOSPITAL_COMMUNITY): Payer: Self-pay | Admitting: Surgery

## 2021-11-05 NOTE — Telephone Encounter (Signed)
I attempted to reach patient regarding labwork results and recommendations per Dr. Aundra Dubin.  I asked patient to return call.

## 2021-11-05 NOTE — Telephone Encounter (Signed)
-----   Message from Larey Dresser, MD sent at 11/02/2021 11:04 PM EST ----- Creatinine higher, decrease torsemide back to 60 mg daily and limit sodium.  BMET 10 days.

## 2021-11-10 ENCOUNTER — Telehealth (HOSPITAL_COMMUNITY): Payer: Self-pay

## 2021-11-10 ENCOUNTER — Encounter (HOSPITAL_COMMUNITY): Payer: Self-pay

## 2021-11-10 NOTE — Telephone Encounter (Addendum)
Have tried calling patient multiple times on all numbers available. No answer. Letter sent  ----- Message from Larey Dresser, MD sent at 11/02/2021 11:04 PM EST ----- Creatinine higher, decrease torsemide back to 60 mg daily and limit sodium.  BMET 10 days.

## 2021-11-11 ENCOUNTER — Other Ambulatory Visit: Payer: Self-pay

## 2021-11-11 ENCOUNTER — Encounter (HOSPITAL_BASED_OUTPATIENT_CLINIC_OR_DEPARTMENT_OTHER): Payer: Medicare Other | Admitting: Internal Medicine

## 2021-11-11 ENCOUNTER — Other Ambulatory Visit (HOSPITAL_COMMUNITY): Payer: Self-pay

## 2021-11-11 DIAGNOSIS — E1151 Type 2 diabetes mellitus with diabetic peripheral angiopathy without gangrene: Secondary | ICD-10-CM | POA: Diagnosis not present

## 2021-11-11 MED ORDER — TORSEMIDE 20 MG PO TABS: 60.0000 mg | ORAL_TABLET | Freq: Two times a day (BID) | ORAL | 11 refills | Status: DC

## 2021-11-11 NOTE — Progress Notes (Signed)
[name], [name] ([id number]) Visit Report for 11/11/2021 HPI Details Patient Name: Date of Service: [name] 11/11/2021 2:15 PM Medical Record Number: [id number] Patient Account Number: [id number] Date of Birth/Sex: Treating RN: [birth year] (60 y.o. [name] Primary Care Provider: Charlott Rakes Other Clinician: Referring Provider: Treating Provider/Extender: Sindy Guadeloupe Weeks in Treatment: 37 History of Present Illness HPI Description: This 60 year old patient who has a very long significant history of diabetes mellitus, previous alcohol and nicotine abuse, chronic data disease, COPD, diabetes mellitus, height hypertension, critical lower limb ischemia with several wounds being managed at the wound Center at [facility] for over a year. She was recently in the ER at [facility] and was referred to our center. He has had a long history of critical limb ischemia and over a period of time has had balloon angioplasties in March 2016, endarterectomy of the left carotid, lower extremity angiogram and treatment by Dr. Quay Burow, several cardiac catheterizations. Most recently she had an x-ray of her left foot while in the ER which showed no acute abnormality. during this ER visit she was started on ciprofloxacin and asked to continue with the wound care physicians at [facility]. Her last ABI done in June 2017 showed the right side to be 0.28 in the left side to be 0.48. Her right toe brachial index was 0.17 on the right and 0.27 on the left. her last hemoglobin A1c was 11.1% She continues to smoke about a pack of cigarettes a day. 03/28/2017 -- -- right foot x-ray -- IMPRESSION:Areas of soft tissue swelling. Mild subluxation second PIP joint. No frank dislocation or fracture. No erosive change or bony destruction. No soft tissue ulceration or radiopaque foreign body evident by radiography. There is plantar fascia calcification with a nearby inferior  calcaneal spur. There are foci of arterial vascular calcification. 04/04/2017 -- the patient continues to be noncompliant and continues to complain of a lot of pain and has not done anything about quitting smoking Readmission: 06/26/18 on evaluation today patient presents for reevaluation she has not been seen in our office since May 2018. Since she was last seen here in our office she has undergone testing at Dr. Elspeth Cho office where it was revealed that she had an ABI of 0.68 with her previous ABI being 0.64 and a left ABI of 0.38 with previous ABI being 0.34 this study which was performed on 04/05/18 was pretty much equivalent to the study performed on 11/22/17. The findings in the end revealed on the right would appear to be moderate right lower extremity arterial disease on the left Dr. Gwenlyn Found states moderate in the report but unfortunately this appears to be much more severe at 0.38 compared to the right. Subsequently the patient was scheduled to have angiography with Dr. Gwenlyn Found on 04/12/18. This was however canceled due to the fact that the patient was found to have chronic kidney disease stage III and it was to the point that he did not feel that it will be safe to pursue angiography at that point. She has not been on any antibiotics recently. At this point Dr. Gwenlyn Found has not rescheduled anything as far as the injured [place] according to the patient he is somewhat reluctant to do so. Nonetheless with her diminished blood flow this is gonna make it somewhat difficult for her to heal. 07/05/18; this is a patient I have not seen previously. She has very significant PAD as noted above. She apparently has had  revascularization efforts by Dr. Gwenlyn Found on the right on 3 occasions to the patient. She was supposed to have an attempted angiography on the left however this was canceled apparently because of stage III chronic renal failure. I will need to research all of this. She complains of significant pain  in the wound and has claudication enough that when she walks to the end of the driveway she has to stop. She is been using silver alginate to the wounds on her legs and Iodoflex to the area on her left second toe. 07/13/18 on evaluation today patient's wounds actually appear to be doing about the same. She has an appointment she tells me within the next month that is September 2019 with Dr. Gwenlyn Found to discuss options to see if there's anything else that you can recommend or do for her. Nonetheless obviously what we're trying to do is do what we can to save her leg and in turn prevent any additional worsening or damage. None in the meantime we been mainly trying to manage her ulcers as best we can. 07/27/18; some improvement in the multiplicity of wounds on her left lower extremity and foot. She's been using silver alginate. I was unable to determine that she actually has an appointment with Dr. Gwenlyn Found. We are checking into this The patient's wound includes Left lateral foot, left plantar heel, her left anterior calf wound looks close to me, left fourth toe is very close to closed and the left medial calf is perhaps the largest open area READMISSION 02/19/2021 This is a now 60 year old woman with type 2 diabetes and continued cigarette smoking. She has known PAD. She was last in this clinic in 2019 at that point with wounds on her left foot. She left in a nonhealed state. On 06/28/2019 I see she had a left femoropopliteal by vein and vascular with an amputation of the fifth ray. These managed to heal over. Her last arterial studies were in February 2021. This showed an absent waveform at the posterior tibial artery on the right a dampened monophasic dorsalis pedis on the right of 0.44. On the left again the PTA TA was absent her dorsalis pedis was 0.99 triphasic and her great toe pressure was 0.65. This would have been after her revascularization. Once again she tells me that Dr. Gwenlyn Found has done  revascularization on the right leg on 3 different occasions. She has a right transmetatarsal amputation apparently done by Dr. Sharol Given remotely but I do not see a note for these right lower extremity revascularization but I may not of looked back far enough. In any case she says that the she has a wound on the plantar aspect of the right transmetatarsal amputation site there is been there for several months. More recently she fell and had a wound on her right medial lower leg she had 5 sutures placed in the ER and then subsequently she has developed an area on the medial lower leg which was a blister that opened up. Past medical history includes type 2 diabetes, PAD, chronic renal failure, congestive heart failure, right transmetatarsal amputation, left fifth ray amputation, continued tobacco abuse, atrial fibrillation and cirrhosis. We did not attempt an ABI on the right leg today because of pain 02/26/2021; patient we admitted to the clinic last week. She has what looks to be 2 areas on her right medial and right anterior lower leg that look more like venous wounds but she has 1 on the first met head at the base of  her right transmet. She has known severe PAD. She complains of a lot of pain although some of this may be neuropathic. Is difficult to exclude a component of claudication. Use silver alginate on the wounds on her legs and Iodoflex on the area on the first met head. She has an appointment with Dr. Gwenlyn Found on 5/25 although I will text him and discussed the situation. She is have poorly controlled diabetic. She has has stage IIIb chronic renal failure 4/22; patient presents for 1 week follow-up. The 2 areas on her right medial and right anterior lower leg appear well-healing. She has been using silver alginate to this area without issues. She has a first met head ulcer and it is unclear how long this has been there as it was discovered in the ED earlier this month when she was being  evaluated for another issue. Iodoflex has been used at this area. 4/29; patient presents for 1 week follow-up. She has been using silver alginate to the leg wounds and Iodoflex to the plantar foot wound. She has had this wrapped with Coban and Kerlix. She has no concerns or complaints today. 5/6; this is a difficult wound on the right plantar foot transmit site. We are not making a lot of progress. She had 2 more venous looking wounds on the right medial and right anterior lower leg 1 of which is healed. Her appointment with Dr. Gwenlyn Found is on 5/25 5/16; she did not tolerate the offloading shoe we gave her in fact she had a fall with an abrasion on her right forearm she is now back in the modified small shoe which does not offload her foot properly. Her appoint with Dr. Gwenlyn Found is still on 5/25 we have been using Iodoflex. The area on the right anterior lower leg is healed 7/18; patient presents for follow-up however has not been seen in 2 months. She was last seen at the end of May. She had a fall at the end of June and was hospitalized. She has been unable to follow-up since then. For the wound she has only been keeping it covered with gauze. She is scheduled for an aortogram with lower extremity runoff at the end of July. 7/29; patient presents for 2-week follow-up. She has home health and they have been changing her dressings. She denies any issues and has no complaints today. She states she had a procedure where they opened up one of her vessels in her legs. She denies signs of infection. 8/5; patient presents for follow-up. She was recently in the hospital for bradycardia. She was noted to have cellulitis to the left lower extremity and Unna boot was placed due to blisters and increased swelling. She reports improvement to her left leg since being in the hospital and stability check her right plantar foot wound. She denies signs of infection. 8/23; since I last saw this patient a lot has happened.  She still has the area over the right first met head in the setting of previous transmetatarsal amputation. Firstly most importantly she underwent revascularization of her occluded right SFA. She underwent directional atherectomy followed by drug-coated balloon angioplasty. She has no named vessel below the knee hopefully the revascularization of the right SFA will improve collateral flow. It is not felt however that she has any endovascular options. She had follow-up arterial studies noninvasive on 8/9. These showed an ABI of 0.44 at the right PTA. Monophasic waveforms. On the left her great toe ABI was 0.68. Her follow-up arterial Doppler  showed a 50 to 74% stenosis in the proximal SFA and a 50 to 74% stenosis in the distal SFA mild to severe atherosclerosis noted throughout the extremity. Areas of shadowing plaque seen, unable to rule out higher grade stenosis she also had a fall apparently wearing a right foot forefoot off loader that we gave her. She therefore comes in in an ordinary running shoe today. Not certain if this is the fall that ended up in the hospital with bradycardia 9/6 area over the right first metatarsal head in the setting of her previous amputation and severe PAD. She is also a diabetic with known PAD status post attempt at revascularization. She is continued smoker Again the separation of visits in the clinic is somewhat disturbing if we are going to consider her for a total contact cast. She is not wearing anything to offload this stating it causes imbalance especially the forefoot off loader. She basically comes in in bedroom slippers She also had a fall this morning about an hour ago. She has a skin tear on the left dorsal forearm 9/13; areas over the right first metatarsal head in the setting of previous TMA and severe PAD. Again she comes in here in slippers. We have been using Hydrofera Blue. We use MolecuLight on this which was essentially negative study 9/20; right  first metatarsal head in the setting of her previous TMA and severe PAD. Same slipper type shoes. She says she cannot wear a forefoot off loader and for some reason she will not wear surgical shoes. She says she is smoking half a pack per day. 9/30; right first metatarsal head. Absolutely no improvement in fact there is tunneling superiorly very close to bone. She does not offload this properly at all. We use MolecuLight on this 2 weeks ago that did not show any surface bacteria we have been using silver collagen is a dressing She tells me she is down to 3 cigarettes a day I have offered encouragement and a treatment plan 10/6; right first metatarsal head. Exposed bone this week which is not surprising. We have been using silver collagen. She is smoking 5 to 10 cigarettes a day 10/17; she has not been here in 10 days. The bone scraping that I did on 10/6 showed staph lugdunensis, staph epidermidis and Pseudomonas Alcalifenes. I put in for Septra DS 1 p.o. twice daily for 14 days last week would be but we could not reach her to start the antibiotic. Quinolones would have been a good alternative except she has multiple drug interactions. Septra would not cover what ever the Pseudomonas is but I am not really sure of the relevance here. I am going to try her on the Septra for 2 weeks. She has a probing wound on the right TMA that probes to bone. She claims to have just about stop smoking I reviewed her arterial status. She underwent a left fem-tib bypass by Dr. Donnetta Hutching in 06/28/2019. Her last angiogram was in July of this year by Dr. Gwenlyn Found. This showed an occluded right SFA the popliteal and tibial vessels were also occluded the peroneal vessel filled by collaterals and filled the PT and DP by communicating collaterals. She was not felt to have any additional and vascular options. Dr. Gwenlyn Found noted that she he had previously revascularized her right SFA 3 times. 10/25; she is tolerating the Septra but says it  makes her nauseated. I am going to continue this for an additional 2 weeks perhaps for a total of 6  weeks. Her wound does not look too much different. There is on the right first met metatarsal head. There is probing areas around the tissue that is in the middle of the wound. There is no purulence I cannot feel bone. The original source was for a bone scraping. 11/8; right first metatarsal head. This does not probe to bone and there is no purulence. As far as I can tell she is completed the Septra although there were problems with exact length of time and I think compliance. In any case I gave her 6 weeks. I have reviewed her PAD above. 11/15; right first metatarsal head. Again protruding subcutaneous tissue but this does not have any adherence to surrounding skin. Undermining circumferentially. There is no palpable bone. Not grossly purulent. We have been using Iodoflex to try to get some adherence to clean off the surface but no real improvement. I do not think they actually had enough supplies listening to the patient. She completed the 6 weeks of Septra I gave her, but I am not sure about the compliance with the dates i.e. may have missed days taken 1 a day etc. The patient has a vascular follow-up with Dr. Gwenlyn Found this coming Friday i.e. in 3 days. By my read of arterial evaluations I do not think she is felt to be a candidate for any further revascularization. The last angiogram was in July. 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 She continues to smoke now up to 10 cigarettes a day. She is not in any pain. Wound has not improved but is certainly not worsened. I gave her 6 weeks of trimethoprim/sulfamethoxazole which she is completed in some fashion. 11/29 right first metatarsal head previous TMA. Wound actually looks somewhat better today still a lot of callus around the wound on debris on the wound surface. UNFORTUNATELY the patient missed her appointment with Dr. Gwenlyn Found and  is not rebooked apparently until sometime in February 12/13; right first metatarsal wound actually looks some smaller today. I did not debride this today. She is using Iodoflex. When she came in last week she had a large abscess on her left anterior lower leg apparently after falling we send her to the ER to have this drained which was done. I cannot see any cultures x-ray was negative. She was apparently given 2 doses of IV antibiotics and discharged on Bactrim DS twice daily which she is still taking. As mentioned I cannot see any cultures. 12/29; culture of the abscess on the left leg I did last time showed Serratia. We gave her antibiotics. I note she was in the ER next day with bleeding which they controlled she is on anticoagulants.She comes in today to clinic and the area on her right plantar foot is miraculously very hyperkeratotic but healed Electronic Signature(s) Signed: 11/11/2021 5:16:25 PM By: Linton Ham MD Entered By: Linton Ham on 11/11/2021 14:35:14 -------------------------------------------------------------------------------- Physical Exam Details Patient Name: Date of Service: [name], [name] 11/11/2021 2:15 PM Medical Record Number: [id number] Patient Account Number: [id number] Date of Birth/Sex: Treating RN: 1961/05/23 (60 y.o. [name] Primary Care Provider: Charlott Rakes Other Clinician: Referring Provider: Treating Provider/Extender: Sindy Guadeloupe Weeks in Treatment: 37 Constitutional Sitting or standing Blood Pressure is within target range for patient.. Pulse regular and within target range for patient.[name] Respirations regular, non-labored and within target range.. Temperature is normal and within the target range for the patient.[name] Appears in no distress. Notes Wound exam; right first metatarsal  head hyperkeratotic thick callus I removed this with a #5 curette there is no open wound here. The area on her left lateral leg is  also closed this was initially a subcutaneous abscess Electronic Signature(s) Signed: 11/11/2021 5:16:25 PM By: Linton Ham MD Entered By: Linton Ham on 11/11/2021 14:35:52 -------------------------------------------------------------------------------- Physician Orders Details Patient Name: Date of Service: [name], [name] 11/11/2021 2:15 PM Medical Record Number: [id number] Patient Account Number: [id number] Date of Birth/Sex: Treating RN: 1961-07-10 (60 y.o. 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 Never walk barefoot Check feet daily ***Callous pad for protection to right foot closed area.*** Electronic Signature(s) Signed: 11/11/2021 5:16:25 PM By: Linton Ham MD Signed: 11/11/2021 5:52:00 PM By: Deon Pilling RN, BSN Signed: 11/11/2021 5:52:00 PM By: Deon Pilling RN, BSN Entered By: Deon Pilling on 11/11/2021 14:34:25 -------------------------------------------------------------------------------- Problem List Details Patient Name: Date of Service: [name], [name] 11/11/2021 2:15 PM Medical Record Number: [id number] Patient Account Number: [id number] Date of Birth/Sex: Treating RN: [birth year] (60 y.o. 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, right ankle and foot 08/30/2021 No Yes E11.51 Type 2 diabetes mellitus with diabetic peripheral angiopathy without gangrene 02/19/2021 No Yes I87.2 Venous insufficiency (chronic) (peripheral) 06/18/2021 No Yes Inactive Problems ICD-10 Code Description Active Date Inactive Date L97.811 Non-pressure chronic ulcer of other part of right lower leg limited to breakdown of skin 02/19/2021 02/19/2021 S81.802A Unspecified open wound, left lower leg, initial encounter 06/18/2021 06/18/2021 S40.812D Abrasion of left upper arm, subsequent encounter 07/20/2021 07/20/2021 Resolved Problems ICD-10 Code Description Active Date Resolved Date S40.811D Abrasion of right upper arm, subsequent encounter 03/29/2021 03/29/2021 Electronic Signature(s) Signed: 11/11/2021 5:16:25 PM By: Linton Ham MD Entered By: Linton Ham on 11/11/2021 14:33:51 -------------------------------------------------------------------------------- Progress Note Details Patient Name: Date of Service: [name], [name] 11/11/2021 2:15 PM Medical Record Number: [id number] Patient Account Number: [id number] Date of Birth/Sex: Treating RN: October 25, 1961 (60 y.o. [name] Primary Care Provider: Charlott Rakes Other Clinician: Referring Provider: Treating Provider/Extender: Sindy Guadeloupe Weeks in Treatment: 37 Subjective History of Present Illness (HPI) This 60 year old patient who has a very long significant history of diabetes mellitus, previous alcohol  and nicotine abuse, chronic data disease, COPD, diabetes mellitus, height hypertension, critical lower limb ischemia with several wounds being managed at the wound Center at [facility] for over a year. She was recently in the ER at [facility] and was referred to our center. He has had a long history of critical limb ischemia and over a period of time has had  balloon angioplasties in March 2016, endarterectomy of the left carotid, lower extremity angiogram and treatment by Dr. Quay Burow, several cardiac catheterizations. Most recently she had an x-ray of her left foot while in the ER which showed no acute abnormality. during this ER visit she was started on ciprofloxacin and asked to continue with the wound care physicians at [facility]. Her last ABI done in June 2017 showed the right side to be 0.28 in the left side to be 0.48. Her right toe brachial index was 0.17 on the right and 0.27 on the left. her last hemoglobin A1c was 11.1% She continues to smoke about a pack of cigarettes a day. 03/28/2017 -- -- right foot x-ray -- IMPRESSION:Areas of soft tissue swelling. Mild subluxation second PIP joint. No frank dislocation or fracture. No erosive change or bony destruction. No soft tissue ulceration or radiopaque foreign body evident by radiography. There is plantar fascia calcification with a nearby inferior calcaneal spur. There are foci of arterial vascular calcification. 04/04/2017 -- the patient continues to be noncompliant and continues to complain of a lot of pain and has not done anything about quitting smoking Readmission: 06/26/18 on evaluation today patient presents for reevaluation she has not been seen in our office since May 2018. Since she was last seen here in our office she has undergone testing at Dr. Fransico Alekxander [place] office where it was revealed that she had an ABI of 0.68 with her previous ABI being 0.64 and a left ABI of 0.38 with previous ABI being 0.34 this study  which was performed on 04/05/18 was pretty much equivalent to the study performed on 11/22/17. The findings in the end revealed on the right would appear to be moderate right lower extremity arterial disease on the left Dr. Gwenlyn Found states moderate in the report but unfortunately this appears to be much more severe at 0.38 compared to the right. Subsequently the patient was scheduled to have angiography with Dr. Gwenlyn Found on 04/12/18. This was however canceled due to the fact that the patient was found to have chronic kidney disease stage III and it was to the point that he did not feel that it will be safe to pursue angiography at that point. She has not been on any antibiotics recently. At this point Dr. Gwenlyn Found has not rescheduled anything as far as the injured [place] according to the patient he is somewhat reluctant to do so. Nonetheless with her diminished blood flow this is gonna make it somewhat difficult for her to heal. 07/05/18; this is a patient I have not seen previously. She has very significant PAD as noted above. She apparently has had revascularization efforts by Dr. Gwenlyn Found on the right on 3 occasions to the patient. She was supposed to have an attempted angiography on the left however this was canceled apparently because of stage III chronic renal failure. I will need to research all of this. She complains of significant pain in the wound and has claudication enough that when she walks to the end of the driveway she has to stop. She is been using silver alginate to the wounds on her legs and Iodoflex to the area on her left second toe. 07/13/18 on evaluation today patient's wounds actually appear to be doing about the same. She has an appointment she tells me within the next month that is September 2019 with Dr. Gwenlyn Found to discuss options to see if there's anything else that you can recommend or do for her. Nonetheless obviously what we're trying  to do is do what we can to save her leg and in turn  prevent any additional worsening or damage. None in the meantime we been mainly trying to manage her ulcers as best we can. 07/27/18; some improvement in the multiplicity of wounds on her left lower extremity and foot. She's been using silver alginate. I was unable to determine that she actually has an appointment with Dr. Gwenlyn Found. We are checking into this The patient's wound includes Left lateral foot, left plantar heel, her left anterior calf wound looks close to me, left fourth toe is very close to closed and the left medial calf is perhaps the largest open area READMISSION 02/19/2021 This is a now 60 year old woman with type 2 diabetes and continued cigarette smoking. She has known PAD. She was last in this clinic in 2019 at that point with wounds on her left foot. She left in a nonhealed state. On 06/28/2019 I see she had a left femoropopliteal by vein and vascular with an amputation of the fifth ray. These managed to heal over. Her last arterial studies were in February 2021. This showed an absent waveform at the posterior tibial artery on the right a dampened monophasic dorsalis pedis on the right of 0.44. On the left again the PTA TA was absent her dorsalis pedis was 0.99 triphasic and her great toe pressure was 0.65. This would have been after her revascularization. Once again she tells me that Dr. Gwenlyn Found has done revascularization on the right leg on 3 different occasions. She has a right transmetatarsal amputation apparently done by Dr. Sharol Given remotely but I do not see a note for these right lower extremity revascularization but I may not of looked back far enough. In any case she says that the she has a wound on the plantar aspect of the right transmetatarsal amputation site there is been there for several months. More recently she fell and had a wound on her right medial lower leg she had 5 sutures placed in the ER and then subsequently she has developed an area on the medial lower leg which  was a blister that opened up. Past medical history includes type 2 diabetes, PAD, chronic renal failure, congestive heart failure, right transmetatarsal amputation, left fifth ray amputation, continued tobacco abuse, atrial fibrillation and cirrhosis. We did not attempt an ABI on the right leg today because of pain 02/26/2021; patient we admitted to the clinic last week. She has what looks to be 2 areas on her right medial and right anterior lower leg that look more like venous wounds but she has 1 on the first met head at the base of her right transmet. She has known severe PAD. She complains of a lot of pain although some of this may be neuropathic. Is difficult to exclude a component of claudication. Use silver alginate on the wounds on her legs and Iodoflex on the area on the first met head. She has an appointment with Dr. Gwenlyn Found on 5/25 although I will text him and discussed the situation. She is have poorly controlled diabetic. She has has stage IIIb chronic renal failure 4/22; patient presents for 1 week follow-up. The 2 areas on her right medial and right anterior lower leg appear well-healing. She has been using silver alginate to this area without issues. She has a first met head ulcer and it is unclear how long this has been there as it was discovered in the ED earlier this month when she was being evaluated for another  issue. Iodoflex has been used at this area. 4/29; patient presents for 1 week follow-up. She has been using silver alginate to the leg wounds and Iodoflex to the plantar foot wound. She has had this wrapped with Coban and Kerlix. She has no concerns or complaints today. 5/6; this is a difficult wound on the right plantar foot transmit site. We are not making a lot of progress. She had 2 more venous looking wounds on the right medial and right anterior lower leg 1 of which is healed. Her appointment with Dr. Gwenlyn Found is on 5/25 5/16; she did not tolerate the offloading shoe we  gave her in fact she had a fall with an abrasion on her right forearm she is now back in the modified small shoe which does not offload her foot properly. Her appoint with Dr. Gwenlyn Found is still on 5/25 we have been using Iodoflex. The area on the right anterior lower leg is healed 7/18; patient presents for follow-up however has not been seen in 2 months. She was last seen at the end of May. She had a fall at the end of June and was hospitalized. She has been unable to follow-up since then. For the wound she has only been keeping it covered with gauze. She is scheduled for an aortogram with lower extremity runoff at the end of July. 7/29; patient presents for 2-week follow-up. She has home health and they have been changing her dressings. She denies any issues and has no complaints today. She states she had a procedure where they opened up one of her vessels in her legs. She denies signs of infection. 8/5; patient presents for follow-up. She was recently in the hospital for bradycardia. She was noted to have cellulitis to the left lower extremity and Unna boot was placed due to blisters and increased swelling. She reports improvement to her left leg since being in the hospital and stability check her right plantar foot wound. She denies signs of infection. 8/23; since I last saw this patient a lot has happened. She still has the area over the right first met head in the setting of previous transmetatarsal amputation. Firstly most importantly she underwent revascularization of her occluded right SFA. She underwent directional atherectomy followed by drug-coated balloon angioplasty. She has no named vessel below the knee hopefully the revascularization of the right SFA will improve collateral flow. It is not felt however that she has any endovascular options. She had follow-up arterial studies noninvasive on 8/9. These showed an ABI of 0.44 at the right PTA. Monophasic waveforms. On the left her great toe  ABI was 0.68. Her follow-up arterial Doppler showed a 50 to 74% stenosis in the proximal SFA and a 50 to 74% stenosis in the distal SFA mild to severe atherosclerosis noted throughout the extremity. Areas of shadowing plaque seen, unable to rule out higher grade stenosis she also had a fall apparently wearing a right foot forefoot off loader that we gave her. She therefore comes in in an ordinary running shoe today. Not certain if this is the fall that ended up in the hospital with bradycardia 9/6 area over the right first metatarsal head in the setting of her previous amputation and severe PAD. She is also a diabetic with known PAD status post attempt at revascularization. She is continued smoker Again the separation of visits in the clinic is somewhat disturbing if we are going to consider her for a total contact cast. She is not wearing anything to offload  this stating it causes imbalance especially the forefoot off loader. She basically comes in in bedroom slippers She also had a fall this morning about an hour ago. She has a skin tear on the left dorsal forearm 9/13; areas over the right first metatarsal head in the setting of previous TMA and severe PAD. Again she comes in here in slippers. We have been using Hydrofera Blue. We use MolecuLight on this which was essentially negative study 9/20; right first metatarsal head in the setting of her previous TMA and severe PAD. Same slipper type shoes. She says she cannot wear a forefoot off loader and for some reason she will not wear surgical shoes. She says she is smoking half a pack per day. 9/30; right first metatarsal head. Absolutely no improvement in fact there is tunneling superiorly very close to bone. She does not offload this properly at all. We use MolecuLight on this 2 weeks ago that did not show any surface bacteria we have been using silver collagen is a dressing She tells me she is down to 3 cigarettes a day I have offered  encouragement and a treatment plan 10/6; right first metatarsal head. Exposed bone this week which is not surprising. We have been using silver collagen. She is smoking 5 to 10 cigarettes a day 10/17; she has not been here in 10 days. The bone scraping that I did on 10/6 showed staph lugdunensis, staph epidermidis and Pseudomonas Alcalifenes. I put in for Septra DS 1 p.o. twice daily for 14 days last week would be but we could not reach her to start the antibiotic. Quinolones would have been a good alternative except she has multiple drug interactions. Septra would not cover what ever the Pseudomonas is but I am not really sure of the relevance here. I am going to try her on the Septra for 2 weeks. She has a probing wound on the right TMA that probes to bone. She claims to have just about stop smoking I reviewed her arterial status. She underwent a left fem-tib bypass by Dr. Donnetta Hutching in 06/28/2019. Her last angiogram was in July of this year by Dr. Gwenlyn Found. This showed an occluded right SFA the popliteal and tibial vessels were also occluded the peroneal vessel filled by collaterals and filled the PT and DP by communicating collaterals. She was not felt to have any additional and vascular options. Dr. Gwenlyn Found noted that she he had previously revascularized her right SFA 3 times. 10/25; she is tolerating the Septra but says it makes her nauseated. I am going to continue this for an additional 2 weeks perhaps for a total of 6 weeks. Her wound does not look too much different. There is on the right first met metatarsal head. There is probing areas around the tissue that is in the middle of the wound. There is no purulence I cannot feel bone. The original source was for a bone scraping. 11/8; right first metatarsal head. This does not probe to bone and there is no purulence. As far as I can tell she is completed the Septra although there were problems with exact length of time and I think compliance. In any case  I gave her 6 weeks. I have reviewed her PAD above. 11/15; right first metatarsal head. Again protruding subcutaneous tissue but this does not have any adherence to surrounding skin. Undermining circumferentially. There is no palpable bone. Not grossly purulent. We have been using Iodoflex to try to get some adherence to clean off the  surface but no real improvement. I do not think they actually had enough supplies listening to the patient. She completed the 6 weeks of Septra I gave her, but I am not sure about the compliance with the dates i.e. may have missed days taken 1 a day etc. The patient has a vascular follow-up with Dr. Gwenlyn Found this coming Friday i.e. in 3 days. By my read of arterial evaluations I do not think she is felt to be a candidate for any further revascularization. The last angiogram was in July. 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 She continues to smoke now up to 10 cigarettes a day. She is not in any pain. Wound has not improved but is certainly not worsened. I gave her 6 weeks of trimethoprim/sulfamethoxazole which she is completed in some fashion. 11/29 right first metatarsal head previous TMA. Wound actually looks somewhat better today still a lot of callus around the wound on debris on the wound surface. UNFORTUNATELY the patient missed her appointment with Dr. Gwenlyn Found and is not rebooked apparently until sometime in February 12/13; right first metatarsal wound actually looks some smaller today. I did not debride this today. She is using Iodoflex. oo When she came in last week she had a large abscess on her left anterior lower leg apparently after falling we send her to the ER to have this drained which was done. I cannot see any cultures x-ray was negative. She was apparently given 2 doses of IV antibiotics and discharged on Bactrim DS twice daily which she is still taking. As mentioned I cannot see any cultures. 12/29; culture of the abscess on the left  leg I did last time showed Serratia. We gave her antibiotics. I note she was in the ER next day with bleeding which they controlled she is on anticoagulants.She comes in today to clinic and the area on her right plantar foot is miraculously very hyperkeratotic but healed Objective Constitutional Sitting or standing Blood Pressure is within target range for patient.. Pulse regular and within target range for patient.[name] Respirations regular, non-labored and within target range.. Temperature is normal and within the target range for the patient.[name] Appears in no distress. Vitals Time Taken: 2:14 PM, Height: 64 in, Weight: 222 lbs, BMI: 38.1, Temperature: 98.8 F, Pulse: 85 bpm, Respiratory Rate: 16 breaths/min, Blood Pressure: 125/81 mmHg, Capillary Blood Glucose: 199 mg/dl. General Notes: Wound exam; right first metatarsal head hyperkeratotic thick callus I removed this with a #5 curette there is no open wound here. oo The area on her left lateral leg is also closed this was initially a subcutaneous abscess Integumentary (Hair, Skin) Wound #16 status is Healed - Epithelialized. Original cause of wound was Gradually Appeared. The date acquired was: 02/05/2021. The wound has been in treatment 37 weeks. The wound is located on the Right,Plantar Amputation Site - Transmetatarsal. The wound measures 0cm length x 0cm width x 0cm depth; 0cm^2 area and 0cm^3 volume. There is Fat Layer (Subcutaneous Tissue) exposed. There is no tunneling or undermining noted. There is a none present amount of drainage noted. The wound margin is distinct with the outline attached to the wound base. There is no granulation within the wound bed. There is no necrotic tissue within the wound bed. General Notes: callous wound. Wound #22 status is Open. Original cause of wound was Other Lesion. The date acquired was: 10/18/2021. The wound has been in treatment 2 weeks. The wound is located on the Left,Anterior Lower Leg. The wound  measures 0cm length x 0cm width x 0cm depth; 0cm^2 area and 0cm^3 volume. There is no tunneling or undermining noted. There is a none present amount of drainage noted. The wound margin is distinct with the outline attached to the wound base. There is no granulation within the wound bed. There is no necrotic tissue within the wound bed. Assessment Active Problems ICD-10 Non-pressure chronic ulcer of other part of right foot with other specified severity Type 2 diabetes mellitus with foot ulcer Cutaneous abscess of left lower limb Other chronic osteomyelitis, right ankle and foot Type 2 diabetes mellitus with diabetic peripheral angiopathy without gangrene Venous insufficiency (chronic) (peripheral) Procedures A Paring/cutting 1 benign hyperkeratotic lesion procedure was performed. by Ricard Dillon., MD. Post procedure Diagnosis Wound #: Same as Pre-Procedure Plan Discharge From [facility] Services: Discharge from [facility] - call if any future wound care needs. Never walk barefoot Check feet daily ***Callous pad for protection to right foot closed area.*** 1. The patient is healed 2. She is apparently just about stop smoking 3. She has severe PAD and will follow up with Dr. Gwenlyn Found in February. I did not pick up on claudication today 4. The abscess on her left lateral leg that we IandD 2 weeks ago is healed Electronic Signature(s) Signed: 11/11/2021 5:16:25 PM By: Linton Ham MD Entered By: Linton Ham on 11/11/2021 14:37:20 -------------------------------------------------------------------------------- SuperBill Details Patient Name: Date of Service: [name], [name] 11/11/2021 Medical Record Number: [id number] Patient Account Number: [id number] Date of Birth/Sex: Treating RN: 05-08-1961 (60 y.o. [name], Tammi Klippel Primary Care Provider: Charlott Rakes Other Clinician: Referring Provider: Treating Provider/Extender: Sindy Guadeloupe Weeks in  Treatment: 37 Diagnosis Coding ICD-10 Codes Code Description L97.518 Non-pressure chronic ulcer of other part of right foot with other specified severity 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 2 diabetes mellitus with foot ulcer L02.416 Cutaneous abscess of left lower limb M86.671 Other chronic osteomyelitis, right ankle and foot E11.51 Type 2 diabetes mellitus with diabetic peripheral angiopathy without gangrene I87.2 Venous insufficiency (chronic) (peripheral) Facility Procedures CPT4 Code: [id number] Description: 99214 - WOUND CARE VISIT-LEV 4 EST PT Modifier: Quantity: 1 Physician Procedures : CPT4 Code Description Modifier [id number] - WC PHYS LEVEL 2 - EST PT ICD-10 Diagnosis Description L97.518 Non-pressure chronic ulcer of other part of right foot with other specified severity L02.416 Cutaneous abscess of left lower limb 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  2 diabetes mellitus with foot ulcer M86.671 Other chronic osteomyelitis, right ankle and foot Quantity: 1 Electronic Signature(s) Signed: 11/11/2021 5:16:25 PM By: Linton Ham MD Signed: 11/11/2021 5:52:00 PM By: Deon Pilling RN, BSN Entered By: Deon Pilling on 11/11/2021 14:53:03

## 2021-11-11 NOTE — Progress Notes (Signed)
Paramedicine Encounter    Patient ID: [name], female    DOB: Dec 19, 1960, 60 y.o.   MRN: [id number]   BP (!) 162/64    Pulse 80    Resp 18    Wt 207 lb (93.9 kg)    LMP  (LMP Unknown)    SpO2 97%    BMI 35.53 kg/m  CBG PTA-199 Weight yesterday-? Last visit weight-208  Pt reports she is doing ok, she has the dry heaves in the mornings, but after a minute of that it passes and she feels better.  Still having a lot of GI issues. Belching/gas. Denies eating spicy/greasy foods.  She has smoked 1.5 cigarettes in past couple days.  She thinks she feels sicker when she smokes while taking that wellbutrin.    Refills needed:  Amio-needs is 2nd pill box  Eliquis Bupropion  Folic acid Hydralazine-missing from 2nd pill box  Needs the lower dose of the trulicity.  Requested all this from pharmacy.   Meds verified and 2 wks worth of meds refilled.  ssss  Patient Care Team: Charlott Rakes, MD as PCP - General (Family Medicine) [name], MD as PCP - Cardiology (Cardiology) [name], MD as PCP - Electrophysiology (Cardiology) [name], MD as Consulting Physician (Cardiology) [name], MD as Consulting Physician (Pulmonary Disease) System, Provider Not In  Patient Active Problem List   Diagnosis Date Noted   Hyperlipidemia associated with type 2 diabetes mellitus ([place]) 07/29/2021   (HFpEF) heart failure with preserved ejection fraction ([place]) 07/29/2021   Bradycardia 06/13/2021   Uncontrolled type 2 diabetes mellitus with hyperglycemia ([place]) 06/13/2021   Acute kidney injury superimposed on CKD ([place]) 06/13/2021   Critical ischemia of foot ([place]) 06/07/2021   Fall 05/05/2021   PAF (paroxysmal atrial fibrillation) ([place]) 05/05/2021   COPD (chronic obstructive pulmonary disease) ([place]) 05/05/2021   DM (diabetes mellitus), type 2 with complications ([place]) 78/58/8502   PAD (peripheral artery disease) ([place]) 05/05/2021   Cellulitis 05/05/2021   Acute on  chronic combined systolic and diastolic CHF (congestive heart failure) ([place]) 05/05/2021   OSA (obstructive sleep apnea) 05/05/2021   CKD (chronic kidney disease) stage 3, GFR 30-59 ml/min ([place]) 05/05/2021   AKI (acute kidney injury) ([place]) 05/04/2021   Pressure injury of skin 05/04/2021   Ulcer of foot, unspecified laterality, limited to breakdown of skin ([place]) 02/17/2021   Altered mental status 01/02/2021   AF (paroxysmal atrial fibrillation) ([place]) 01/02/2021   CAD (coronary artery disease) 01/02/2021   PVD (peripheral vascular disease) ([place]) 01/02/2021   Acute renal failure superimposed on stage 4 chronic kidney disease ([place]) 01/02/2021   Diabetes mellitus type 2, uncontrolled, with complications 77/41/2878   Diabetic nephropathy ([place]) 01/02/2021   Low back pain 06/01/2020   Hyperlipidemia 04/03/2020   Muscle weakness 03/20/2020   Closed fracture of neck of right radius 03/13/2020   Pain in elbow 03/12/2020   PAD (peripheral artery disease) ([place]) 12/26/2019   Acute renal failure ([place])    Acute respiratory failure with hypoxia ([place]) 12/02/2019   Acute exacerbation of CHF (congestive heart failure) ([place]) 12/01/2019   Cellulitis in diabetic foot ([place]) 07/08/2019   Osteomyelitis ([place]) 06/21/2019   NICM (nonischemic cardiomyopathy) ([place]) 06/20/2019   Non-healing ulcer ([place]) 06/20/2019   Acute CHF (congestive heart failure) ([place]) 11/26/2018   CHF (congestive heart failure) ([place]) 11/26/2018   Acute respiratory failure ([place]) 10/21/2018   AKI (acute kidney injury) ([place]) 08/18/2018   Coagulation disorder ([place]) 08/09/2017  Depression 07/21/2017   At risk for adverse drug reaction 06/20/2017   Peripheral neuropathy 06/20/2017   Acute osteomyelitis of right foot ([place]) 06/13/2017   S/P transmetatarsal amputation of foot, right ([place]) 06/05/2017   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 ([place]) 05/19/2017   Femoro-popliteal artery disease  ([place])    SIRS (systemic inflammatory response syndrome) ([place]) 04/06/2017   CKD (chronic kidney disease), stage III ([place]) 11/24/2016   Suspected sleep apnea 11/24/2016   Ulcer of toe of right foot, with necrosis of bone ([place]) 10/27/2016   Ulcer of left lower leg ([place]) 05/19/2016   Severe obesity (BMI >= 40) ([place]) 02/24/2016   COPD GOLD 0  02/24/2016   Morbid obesity ([place]) 02/24/2016   Encounter for therapeutic drug monitoring 02/10/2016   Symptomatic bradycardia 01/12/2016   Hypertension associated with diabetes ([place]) 12/22/2015   Chronic combined systolic and diastolic CHF (congestive heart failure) ([place])    [place]    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 11/08/2015   Tobacco abuse 10/23/2015   Coronary artery disease    DOE (dyspnea on exertion) 04/29/2015   Diabetes mellitus type 2, uncontrolled 02/08/2015   Influenza A 02/07/2015   Wrist fracture, left, with routine healing, subsequent encounter 02/05/2015   Wrist fracture, left, closed, initial encounter 01/29/2015   PAF (paroxysmal atrial fibrillation) ([place]) 01/16/2015   Carotid arterial disease ([place]) 01/16/2015   Claudication ([place]) 01/15/2015   Demand ischemia ([place]) 10/29/2014   Insomnia 02/03/2014   COPD with acute exacerbation ([place]) 02/01/2014   S/P peripheral artery angioplasty - TurboHawk atherectomy; R SFA 09/11/2013    Class: Acute   Leg pain, bilateral 08/19/2013   Hypothyroidism 07/31/2013   Cellulitis 06/13/2013   History of cocaine abuse ([place]) 06/13/2013   Long term current use of anticoagulant therapy 05/20/2013   Alcohol abuse    Narcotic abuse ([place])    Marijuana abuse    Alcoholic cirrhosis ([place])    DM (diabetes mellitus), type 2 with peripheral vascular complications ([name])     Current Outpatient Medications:    ACCU-CHEK GUIDE test strip, USE TO CHECK BLOOD SUGAR FOUR TIMES DAILY, Disp: , Rfl:    acetaminophen (TYLENOL) 500 MG tablet, Take 500 mg by mouth every 6 (six) hours as needed for moderate pain., Disp: , Rfl:     albuterol (VENTOLIN HFA) 108 (90 Base) MCG/ACT inhaler, Inhale 2 puffs into the lungs every 6 (six) hours as needed for wheezing or shortness of breath., Disp: 18 g, Rfl: 3   allopurinol (ZYLOPRIM) 100 MG tablet, Take 1 tablet (100 mg total) by mouth daily., Disp: 30 tablet, Rfl: 6   amiodarone (PACERONE) 200 MG tablet, TAKE 1/2 TABLET (100 MG TOTAL) BY MOUTH DAILY., Disp: 15 tablet, Rfl: 3   amLODipine (NORVASC) 10 MG tablet, Take 1 tablet (10 mg total) by mouth daily., Disp: 90 tablet, Rfl: 3   apixaban (ELIQUIS) 5 MG TABS tablet, Take 5 mg by mouth 2 (two) times daily., Disp: , Rfl:    atorvastatin (LIPITOR) 40 MG tablet, Take 1 tablet (40 mg total) by mouth every evening., Disp: 90 tablet, Rfl: 1   budesonide-formoterol (SYMBICORT) 160-4.5 MCG/ACT inhaler, Inhale 2 puffs into the lungs 2 (two) times daily., Disp: 1 Inhaler, Rfl: 2   buPROPion (WELLBUTRIN SR) 150 MG 12 hr tablet, Take 1 tablet by mouth daily for 3 days then increase to 1 tablet by mouth 2 times daily., Disp: 60 tablet, Rfl: 2   clopidogrel (PLAVIX) 75 MG tablet,  Take 1 tablet (75 mg total) by mouth daily., Disp: 30 tablet, Rfl: 11   colchicine 0.6 MG tablet, Take 2 tabs (1.2 mg) at the onset of a gout flare, may take 1 tab (0.6 mg) in 1 hour if symptoms, Disp: 30 tablet, Rfl: 3   empagliflozin (JARDIANCE) 10 MG TABS tablet, Take 1 tablet (10 mg total) by mouth daily before breakfast., Disp: 90 tablet, Rfl: 3   ferrous sulfate 324 MG TBEC, Take 324 mg by mouth., Disp: , Rfl:    FLUoxetine (PROZAC) 20 MG capsule, Take 1 capsule (20 mg total) by mouth at bedtime., Disp: 90 capsule, Rfl: 1   folic acid (FOLVITE) 1 MG tablet, Take 1 tablet (1 mg total) by mouth daily., Disp: 90 tablet, Rfl: 1   hydrALAZINE (APRESOLINE) 50 MG tablet, Take 1 tablet (50 mg total) by mouth 3 (three) times daily., Disp: 90 tablet, Rfl: 6   insulin glargine (LANTUS) 100 UNIT/ML Solostar Pen, Inject 30 Units into the skin daily., Disp: 30 mL, Rfl: 6    insulin lispro (HUMALOG) 100 UNIT/ML KwikPen, Inject 8 Units into the skin 3 (three) times daily with meals., Disp: 15 mL, Rfl: 0   Insulin Pen Needle 32G X 4 MM MISC, USE AS DIRECTED WITH INSULIN PENS, Disp: 100 each, Rfl: 0   levothyroxine (SYNTHROID) 50 MCG tablet, Take 1 tablet (50 mcg total) by mouth daily., Disp: 90 tablet, Rfl: 1   losartan (COZAAR) 25 MG tablet, Take 2 tablets (50 mg total) by mouth daily. (Patient taking differently: Take 50 mg by mouth at bedtime.), Disp: 180 tablet, Rfl: 1   Omega-3 Fatty Acids (FISH OIL PO), Take 1 capsule by mouth daily., Disp: , Rfl:    ondansetron (ZOFRAN) 4 MG tablet, Take 1 tablet (4 mg total) by mouth every 8 (eight) hours as needed for nausea or vomiting., Disp: 20 tablet, Rfl: 0   pantoprazole (PROTONIX) 40 MG tablet, Take 1 tablet (40 mg total) by mouth daily., Disp: 90 tablet, Rfl: 0   potassium chloride SA (KLOR-CON M20) 20 MEQ tablet, Take 1 tablet (20 mEq total) by mouth daily., Disp: 90 tablet, Rfl: 3   torsemide (DEMADEX) 20 MG tablet, Take 4 tablets (80 mg total) by mouth 2 (two) times daily. (Patient taking differently: Take 80 mg by mouth 2 (two) times daily.), Disp: 240 tablet, Rfl: 11   VITAMIN D PO, Take 1 tablet by mouth daily., Disp: , Rfl:    benzonatate (TESSALON) 100 MG capsule, Take 1 capsule (100 mg total) by mouth 2 (two) times daily as needed for cough. (Patient not taking: Reported on 11/04/2021), Disp: 40 capsule, Rfl: 0   Continuous Blood Gluc Sensor (FREESTYLE LIBRE 2 SENSOR) MISC, USE 1 (ONE) EACH EVERY 2 WEEKS (Patient not taking: Reported on 11/11/2021), Disp: 2 each, Rfl: 11   Dulaglutide (TRULICITY) 1.5 DP/8.2UM SOPN, Inject 1.5 mg into the skin once a week. (Patient not taking: Reported on 11/11/2021), Disp: 2 mL, Rfl: 6   HYDROcodone-acetaminophen (NORCO/VICODIN) 5-325 MG tablet, Take 1 tablet by mouth every 4 (four) hours as needed. (Patient not taking: Reported on 11/11/2021), Disp: 10 tablet, Rfl: 0   Misc.  Devices MISC, 2 L oxygen continuously via nasal cannula.  Diagnosis chronic respiratory failure with hypoxia (Patient not taking: Reported on 11/04/2021), Disp: 1 each, Rfl: 0 Allergies  Allergen Reactions   Gabapentin Nausea And Vomiting and Other (See Comments)    POSSIBLE SHAKING   Lyrica [Pregabalin] Other (See Comments)    Shaking  Social History   Socioeconomic History   Marital status: Single    Spouse name: Not on file   Number of children: 1   Years of education: 12   Highest education level: 12th grade  Occupational History   Occupation: disabled  Tobacco Use   Smoking status: Every Day    Packs/day: 1.00    Years: 44.00    Pack years: 44.00    Types: E-cigarettes, Cigarettes   Smokeless tobacco: Former    Types: Snuff  Vaping Use   Vaping Use: Former   Devices: 11/26/2018 "stopped months ago"  Substance and Sexual Activity   Alcohol use: Yes    Comment: occ   Drug use: Yes    Types: "Crack" cocaine, Marijuana, Oxycodone   Sexual activity: Not Currently  Other Topics Concern   Not on file  Social History Narrative   ** Merged History Encounter **       Lives in [place], in motel with sister.  They are looking to move but don't have a place to go yet.     Social Determinants of Health   Financial Resource Strain: Not on file  Food Insecurity: Not on file  Transportation Needs: Not on file  Physical Activity: Not on file  Stress: Not on file  Social Connections: Not on file  Intimate Partner Violence: Not on file    Physical Exam      Future Appointments  Date Time Provider [place]  11/11/2021  2:15 PM Ricard Dillon, MD [facility]  11/16/2021  3:30 PM Charlott Rakes, MD [facility] None  12/03/2021  2:00 PM [facility] PA/NP [facility] None  12/22/2021  1:00 PM [facility] VASC 3 [facility]  01/11/2022  3:30 PM [name], MD [facility] [place], [place] [facility] Paramedic   11/11/21

## 2021-11-11 NOTE — Progress Notes (Signed)
Duplicate entry by mistake

## 2021-11-11 NOTE — Progress Notes (Signed)
CALIEGH, MIDDLEKAUFF ([id number]) Visit Report for 11/11/2021 Arrival Information Details Patient Name: Date of Service: [name] 11/11/2021 2:15 PM Medical Record Number: [id number] Patient Account Number: [id number] Date of Birth/Sex: Treating RN: 02-Jun-1961 (60 y.o. [name], Meta.Reding Primary Care Rosaleah Person: Charlott Rakes Other Clinician: Referring Orin Eberwein: Treating Ashanti Ratti/Extender: Ramon Dredge in Treatment: 25 Visit Information History Since Last Visit Added or deleted any medications: No Patient Arrived: Walker Any new allergies or adverse reactions: No Arrival Time: 14:14 Had a fall or experienced change in No Accompanied By: self activities of daily living that may affect Transfer Assistance: None risk of falls: Patient Identification Verified: Yes Signs or symptoms of abuse/neglect since No Secondary Verification Process Completed: Yes last visito Patient Requires Transmission-Based Precautions: No Hospitalized since last visit: No Patient Has Alerts: No Implantable device outside of the clinic No excluding cellular tissue based products placed in the center since last visit: Has Dressing in Place as Prescribed: Yes Has Compression in Place as Prescribed: Yes Has Footwear/Offloading in Place as Yes Prescribed: Right: Surgical Shoe with Pressure Relief Insole Pain Present Now: No Electronic Signature(s) Signed: 11/11/2021 5:52:00 PM By: Deon Pilling RN, BSN Entered By: Deon Pilling on 11/11/2021 14:20:42 -------------------------------------------------------------------------------- Clinic Level of Care Assessment Details Patient Name: Date of Service: [place], [name] 11/11/2021 2:15 PM Medical Record Number: [id number] Patient Account Number: [id number] Date of Birth/Sex: Treating RN: [birth year] (60 y.o. [name], Meta.Reding Primary Care Rihaan Barrack: Charlott Rakes Other Clinician: Referring Luca Dyar: Treating Zackeriah Kissler/Extender:  Ramon Dredge in Treatment: 37 Clinic Level of Care Assessment Items TOOL 4 Quantity Score X- 1 0 Use when only an EandM is performed on FOLLOW-UP visit ASSESSMENTS - Nursing Assessment / Reassessment X- 1 10 Reassessment of Co-morbidities (includes updates in patient status) X- 1 5 Reassessment of Adherence to Treatment Plan ASSESSMENTS - Wound and Skin A ssessment / Reassessment []  - 0 Simple Wound Assessment / Reassessment - one wound X- 2 5 Complex Wound Assessment / Reassessment - multiple wounds X- 1 10 Dermatologic / Skin Assessment (not related to wound area) ASSESSMENTS - Focused Assessment X- 2 5 Circumferential Edema Measurements - multi extremities X- 1 10 Nutritional Assessment / Counseling / Intervention []  - 0 Lower Extremity Assessment (monofilament, tuning fork, pulses) []  - 0 Peripheral Arterial Disease Assessment (using hand held doppler) ASSESSMENTS - Ostomy and/or Continence Assessment and Care []  - 0 Incontinence Assessment and Management []  - 0 Ostomy Care Assessment and Management (repouching, etc.) PROCESS - Coordination of Care []  - 0 Simple Patient / Family Education for ongoing care X- 1 20 Complex (extensive) Patient / Family Education for ongoing care X- 1 10 Staff obtains Programmer, systems, Records, T Results / Process Orders est []  - 0 Staff telephones HHA, Nursing Homes / Clarify orders / etc []  - 0 Routine Transfer to another Facility (non-emergent condition) []  - 0 Routine Hospital Admission (non-emergent condition) []  - 0 New Admissions / Biomedical engineer / Ordering NPWT Apligraf, etc. , []  - 0 Emergency Hospital Admission (emergent condition) []  - 0 Simple Discharge Coordination X- 1 15 Complex (extensive) Discharge Coordination PROCESS - Special Needs []  - 0 Pediatric / Minor Patient Management []  - 0 Isolation Patient Management []  - 0 Hearing / Language / Visual special needs []  -  0 Assessment of Community assistance (transportation, D/C planning, etc.) []  - 0 Additional assistance / Altered mentation []  - 0 Support Surface(s) Assessment (bed, cushion, seat, etc.) INTERVENTIONS - Wound Cleansing /  Measurement []  - 0 Simple Wound Cleansing - one wound X- 2 5 Complex Wound Cleansing - multiple wounds X- 1 5 Wound Imaging (photographs - any number of wounds) []  - 0 Wound Tracing (instead of photographs) []  - 0 Simple Wound Measurement - one wound X- 2 5 Complex Wound Measurement - multiple wounds INTERVENTIONS - Wound Dressings X - Small Wound Dressing one or multiple wounds 1 10 []  - 0 Medium Wound Dressing one or multiple wounds []  - 0 Large Wound Dressing one or multiple wounds []  - 0 Application of Medications - topical []  - 0 Application of Medications - injection INTERVENTIONS - Miscellaneous []  - 0 External ear exam []  - 0 Specimen Collection (cultures, biopsies, blood, body fluids, etc.) []  - 0 Specimen(s) / Culture(s) sent or taken to Lab for analysis []  - 0 Patient Transfer (multiple staff / Civil Service fast streamer / Similar devices) []  - 0 Simple Staple / Suture removal (25 or less) []  - 0 Complex Staple / Suture removal (26 or more) []  - 0 Hypo / Hyperglycemic Management (close monitor of Blood Glucose) []  - 0 Ankle / Brachial Index (ABI) - do not check if billed separately X- 1 5 Vital Signs Has the patient been seen at the hospital within the last three years: Yes Total Score: 140 Level Of Care: New/Established - Level 4 Electronic Signature(s) Signed: 11/11/2021 5:52:00 PM By: Deon Pilling RN, BSN Entered By: Deon Pilling on 11/11/2021 14:52:52 -------------------------------------------------------------------------------- Encounter Discharge Information Details Patient Name: Date of Service: [name], [name] 11/11/2021 2:15 PM Medical Record Number: [id number] Patient Account Number: [id number] Date of Birth/Sex: Treating  RN: Sep 05, 1961 (60 y.o. [name] Primary Care Vanita Cannell: Charlott Rakes Other Clinician: Referring Tamika Shropshire: Treating Nandi Tonnesen/Extender: Ramon Dredge in Treatment: 37 Encounter Discharge Information Items Discharge Condition: Stable Ambulatory Status: Walker Discharge Destination: Home Transportation: Private Auto Accompanied By: self Schedule Follow-up Appointment: No Clinical Summary of Care: Electronic Signature(s) Signed: 11/11/2021 5:52:00 PM By: Deon Pilling RN, BSN Entered By: Deon Pilling on 11/11/2021 14:53:48 -------------------------------------------------------------------------------- Lower Extremity Assessment Details Patient Name: Date of Service: [name], [name] 11/11/2021 2:15 PM Medical Record Number: [id number] Patient Account Number: [id number] Date of Birth/Sex: Treating RN: 06-19-1961 (60 y.o. [name] Primary Care Troyce Gieske: Charlott Rakes Other Clinician: Referring Maisen Klingler: Treating Saveah Bahar/Extender: Sindy Guadeloupe Weeks in Treatment: 37 Edema Assessment Assessed: Shirlyn Goltz: Yes] Patrice Paradise: Yes] Edema: [Left: Yes] [Right: Yes] Calf Left: Right: Point of Measurement: From Medial Instep 37.5 cm 35.5 cm Ankle Left: Right: Point of Measurement: From Medial Instep 23 cm 20 cm Vascular Assessment Pulses: Dorsalis Pedis Palpable: [Left:Yes] [Right:Yes] Electronic Signature(s) Signed: 11/11/2021 5:52:00 PM By: Deon Pilling RN, BSN Entered By: Deon Pilling on 11/11/2021 14:21:45 -------------------------------------------------------------------------------- Multi Wound Chart Details Patient Name: Date of Service: [name], [name] 11/11/2021 2:15 PM Medical Record Number: [id number] Patient Account Number: [id number] Date of Birth/Sex: Treating RN: 07-Oct-1961 (60 y.o. [name] Primary Care Juanda Luba: Charlott Rakes Other Clinician: Referring Hashir Deleeuw: Treating Benna Arno/Extender:  Sindy Guadeloupe Weeks in Treatment: 37 Vital Signs Height(in): 64 Capillary Blood Glucose(mg/dl): 199 Weight(lbs): 222 Pulse(bpm): 25 Body Mass Index(BMI): 37 Blood Pressure(mmHg): 125/81 Temperature(F): 98.8 Respiratory Rate(breaths/min): 16 Photos: [N/A:N/A] Right, Plantar Amputation Site - Left, Anterior Lower Leg N/A Wound Location: Transmetatarsal Gradually Appeared Other Lesion N/A Wounding Event: Diabetic Wound/Ulcer of the Lower Abscess N/A Primary Etiology: Extremity Chronic Obstructive Pulmonary Chronic Obstructive Pulmonary N/A Comorbid History: Disease (COPD), Sleep Apnea, Disease (COPD), Sleep  Apnea, Arrhythmia, Congestive Heart Failure,Arrhythmia, Congestive Heart Failure, Coronary Artery Disease, Coronary Artery Disease, Hypertension, Peripheral Arterial Hypertension, Peripheral Arterial Disease, Cirrhosis , Type II Diabetes, Disease, Cirrhosis , Type II Diabetes, Osteoarthritis, Neuropathy Osteoarthritis, Neuropathy 02/05/2021 10/18/2021 N/A Date Acquired: 69 2 N/A Weeks of Treatment: Healed - Epithelialized Open N/A Wound Status: 0x0x0 0x0x0 N/A Measurements L x W x D (cm) 0 0 N/A A (cm) : rea 0 0 N/A Volume (cm) : 100.00% 100.00% N/A % Reduction in Area: 100.00% 100.00% N/A % Reduction in Volume: Grade 2 Full Thickness Without Exposed N/A Classification: Support Structures None Present None Present N/A Exudate Amount: Distinct, outline attached Distinct, outline attached N/A Wound Margin: None Present (0%) None Present (0%) N/A Granulation Amount: None Present (0%) None Present (0%) N/A Necrotic Amount: Fat Layer (Subcutaneous Tissue): Yes Fascia: No N/A Exposed Structures: Fascia: No Fat Layer (Subcutaneous Tissue): No Tendon: No Tendon: No Muscle: No Muscle: No Joint: No Joint: No Bone: No Bone: No Large (67-100%) Large (67-100%) N/A Epithelialization: callous wound. N/A N/A Assessment Notes: Treatment  Notes Electronic Signature(s) Signed: 11/11/2021 5:16:25 PM By: Linton Ham MD Signed: 11/11/2021 5:52:00 PM By: Deon Pilling RN, BSN Entered By: Linton Ham on 11/11/2021 14:34:00 -------------------------------------------------------------------------------- Multi-Disciplinary Care Plan Details Patient Name: Date of Service: [name], [name] 11/11/2021 2:15 PM Medical Record Number: [id number] Patient Account Number: [id number] Date of Birth/Sex: Treating RN: August 09, 1961 (60 y.o. [name] Primary Care Koralee Wedeking: Charlott Rakes Other Clinician: Referring Samar Dass: Treating Corianna Avallone/Extender: Ramon Dredge in Treatment: [place] reviewed with physician Active Inactive Electronic Signature(s) Signed: 11/11/2021 5:52:00 PM By: Deon Pilling RN, BSN Entered By: Deon Pilling on 11/11/2021 14:38:03 -------------------------------------------------------------------------------- Pain Assessment Details Patient Name: Date of Service: [name], [name] 11/11/2021 2:15 PM Medical Record Number: [id number] Patient Account Number: [id number] Date of Birth/Sex: Treating RN: November 09, 1961 (60 y.o. [name] Primary Care Benjaman Artman: Charlott Rakes Other Clinician: Referring Tieasha Larsen: Treating Rayleigh Gillyard/Extender: Sindy Guadeloupe Weeks in Treatment: 37 Active Problems Location of Pain Severity and Description of Pain Patient Has Paino No Site Locations Rate the pain. Rate the pain. Current Pain Level: 0 Pain Management and Medication Current Pain Management: Medication: No Cold Application: No Rest: No Massage: No Activity: No T.E.N.S.: No Heat Application: No Leg drop or elevation: No Is the Current Pain Management Adequate: Adequate How does your wound impact your activities of daily livingo Sleep: No Bathing: No Appetite: No Relationship With Others: No Bladder Continence: No Emotions:  No Bowel Continence: No Work: No Toileting: No Drive: No Dressing: No Hobbies: No Electronic Signature(s) Signed: 11/11/2021 5:52:00 PM By: Deon Pilling RN, BSN Entered By: Deon Pilling on 11/11/2021 14:21:15 -------------------------------------------------------------------------------- Patient/Caregiver Education Details Patient Name: Date of Service: [name] 12/29/2022andnbsp2:15 PM Medical Record Number: [id number] Patient Account Number: [id number] Date of Birth/Gender: Treating RN: 02/01/1961 (60 y.o. [name] Primary Care Physician: Charlott Rakes Other Clinician: Referring Physician: Treating Physician/Extender: Ramon Dredge in Treatment: 37 Education Assessment Education Provided To: Patient Education Topics Provided Wound/Skin Impairment: Handouts: Skin Care Do's and Dont's Methods: Explain/Verbal Responses: State content correctly Electronic Signature(s) Signed: 11/11/2021 5:52:00 PM By: Deon Pilling RN, BSN Entered By: Deon Pilling on 11/11/2021 14:41:30 -------------------------------------------------------------------------------- Wound Assessment Details Patient Name: Date of Service: [name], [name] 11/11/2021 2:15 PM Medical Record Number: [id number] Patient Account Number: [id number] Date of Birth/Sex: Treating RN: [birth year] (60 y.o. [name] Primary Care Dejanae Helser: Charlott Rakes  Other Clinician: Referring Makenzye Troutman: Treating Holiday Mcmenamin/Extender: Kandice Robinsons, Charlane Ferretti Weeks in Treatment: 37 Wound Status Wound Number: 16 Primary Diabetic Wound/Ulcer of the Lower Extremity Etiology: Wound Location: Right, Plantar Amputation Site - Transmetatarsal Wound Healed - Epithelialized Wounding Event: Gradually Appeared Status: Date Acquired: 02/05/2021 Comorbid Chronic Obstructive Pulmonary Disease (COPD), Sleep Apnea, Weeks Of Treatment: 37 History: Arrhythmia, Congestive Heart  Failure, Coronary Artery Disease, Clustered Wound: No Hypertension, Peripheral Arterial Disease, Cirrhosis , Type II Diabetes, Osteoarthritis, Neuropathy Photos Wound Measurements Length: (cm) Width: (cm) Depth: (cm) Area: (cm) Volume: (cm) 0 % Reduction in Area: 100% 0 % Reduction in Volume: 100% 0 Epithelialization: Large (67-100%) 0 Tunneling: No 0 Undermining: No Wound Description Classification: Grade 2 Wound Margin: Distinct, outline attached Exudate Amount: None Present Foul Odor After Cleansing: No Slough/Fibrino No Wound Bed Granulation Amount: None Present (0%) Exposed Structure Necrotic Amount: None Present (0%) Fascia Exposed: No Fat Layer (Subcutaneous Tissue) Exposed: Yes Tendon Exposed: No Muscle Exposed: No Joint Exposed: No Bone Exposed: No Assessment Notes callous wound. Treatment Notes Wound #16 (Amputation Site - Transmetatarsal) Wound Laterality: Plantar, Right Cleanser Peri-Wound Care Topical Primary Dressing Secondary Dressing Secured With Compression Wrap Compression Stockings Add-Ons Notes pad the right foot with bordered foam for protection. patient to apply callous pads for protection. Electronic Signature(s) Signed: 11/11/2021 5:52:00 PM By: Deon Pilling RN, BSN Entered By: Deon Pilling on 11/11/2021 14:30:09 -------------------------------------------------------------------------------- Wound Assessment Details Patient Name: Date of Service: [name], [name] 11/11/2021 2:15 PM Medical Record Number: [id number] Patient Account Number: [id number] Date of Birth/Sex: Treating RN: 16-Mar-1961 (60 y.o. [name], Meta.Reding Primary Care Wilmont Olund: Charlott Rakes Other Clinician: Referring Zita Ozimek: Treating Lanissa Cashen/Extender: Sindy Guadeloupe Weeks in Treatment: 37 Wound Status Wound Number: 22 Primary Abscess Etiology: Wound Location: Left, Anterior Lower Leg Wound Open Wounding Event: Other Lesion Status: Date  Acquired: 10/18/2021 Comorbid Chronic Obstructive Pulmonary Disease (COPD), Sleep Apnea, Weeks Of Treatment: 2 History: Arrhythmia, Congestive Heart Failure, Coronary Artery Disease, Clustered Wound: No Hypertension, Peripheral Arterial Disease, Cirrhosis , Type II Diabetes, Osteoarthritis, Neuropathy Photos Wound Measurements Length: (cm) Width: (cm) Depth: (cm) Area: (cm) Volume: (cm) 0 % Reduction in Area: 100% 0 % Reduction in Volume: 100% 0 Epithelialization: Large (67-100%) 0 Tunneling: No 0 Undermining: No Wound Description Classification: Full Thickness Without Exposed Support Structures Wound Margin: Distinct, outline attached Exudate Amount: None Present Foul Odor After Cleansing: No Slough/Fibrino No Wound Bed Granulation Amount: None Present (0%) Exposed Structure Necrotic Amount: None Present (0%) Fascia Exposed: No Fat Layer (Subcutaneous Tissue) Exposed: No Tendon Exposed: No Muscle Exposed: No Joint Exposed: No Bone Exposed: No Electronic Signature(s) Signed: 11/11/2021 5:07:42 PM By: [name] RN Signed: 11/11/2021 5:52:00 PM By: Deon Pilling RN, BSN Entered By: [name] on 11/11/2021 14:24:45 -------------------------------------------------------------------------------- Vitals Details Patient Name: Date of Service: [name], [name] 11/11/2021 2:15 PM Medical Record Number: [id number] Patient Account Number: [id number] Date of Birth/Sex: Treating RN: [birth year] (60 y.o. [name], Tammi Klippel Primary Care Leeloo Silverthorne: Charlott Rakes Other Clinician: Referring Mina Carlisi: Treating Nashiya Disbrow/Extender: Sindy Guadeloupe Weeks in Treatment: 37 Vital Signs Time Taken: 14:14 Temperature (F): 98.8 Height (in): 64 Pulse (bpm): 85 Weight (lbs): 222 Respiratory Rate (breaths/min): 16 Body Mass Index (BMI): 38.1 Blood Pressure (mmHg): 125/81 Capillary Blood Glucose (mg/dl): 199 Reference Range: 80 - 120 mg / dl Electronic  Signature(s) Signed: 11/11/2021 5:52:00 PM By: Deon Pilling RN, BSN Entered By: Deon Pilling on 11/11/2021 14:21:03

## 2021-11-16 ENCOUNTER — Other Ambulatory Visit: Payer: Self-pay

## 2021-11-16 ENCOUNTER — Ambulatory Visit: Payer: Medicare Other | Attending: Family Medicine | Admitting: Family Medicine

## 2021-11-16 ENCOUNTER — Telehealth: Payer: Self-pay | Admitting: Family Medicine

## 2021-11-16 ENCOUNTER — Ambulatory Visit (HOSPITAL_COMMUNITY)
Admission: RE | Admit: 2021-11-16 | Discharge: 2021-11-16 | Disposition: A | Discharge status: Home / Self Care | Payer: Medicare Other | Source: Ambulatory Visit | Attending: Adult Health | Admitting: Adult Health

## 2021-11-16 ENCOUNTER — Encounter: Payer: Self-pay | Admitting: Family Medicine

## 2021-11-16 DIAGNOSIS — E1151 Type 2 diabetes mellitus with diabetic peripheral angiopathy without gangrene: Secondary | ICD-10-CM | POA: Diagnosis not present

## 2021-11-16 DIAGNOSIS — I5042 Chronic combined systolic (congestive) and diastolic (congestive) heart failure: Secondary | ICD-10-CM

## 2021-11-16 DIAGNOSIS — Z720 Tobacco use: Secondary | ICD-10-CM

## 2021-11-16 NOTE — Progress Notes (Signed)
Virtual Visit via Telephone Note  I connected with [name], on 11/16/2021 at 3:38 PM by telephone due to the COVID-19 pandemic and verified that I am speaking with the correct person using two identifiers.   Consent: I discussed the limitations, risks, security and privacy concerns of performing an evaluation and management service by telephone and the availability of in person appointments. I also discussed with the patient that there may be a patient responsible charge related to this service. The patient expressed understanding and agreed to proceed.   Location of Patient: Home  Location of Provider: Home Office   Persons participating in Telemedicine visit: [name] Dr. Margarita Rana     History of Present Illness: [name] is a 61 y.o. year old female with a history of PAD (status post  LLE bypass drug-coated balloon angioplasty of right SFA secondary to critical right limb ischemia), carotid stenosis s/p left CEA, HFrEF (EF 55 to 60%, grade 2 DD), CAD, paroxysmal A. fib, previous substance abuse, type 2 diabetes mellitus(A1c 10.5), right foot transmetatarsal amputation, stage III CKD, liver cirrhosis, hypothyroidism, COPD, hypertension, Anxiety and Depression, tobacco abuse.    She complains the Bupropion is making her sick and so she stopped it.She has cut back on smoking to 1 cig in 2 days and is working on quitting on her own.  Blood sugars run 150-200 and she has had a 60 on one occasion but she was asymptomatic. Endorses compliance with her insulin. She would like a prescription for a CGM; I had previously prescribed one for her but she was unable to obtain his from the Pharmacy.  From a Cardiac stand point she reports doing well with no dyspnea, chest pain  or pedal edema. Past Medical History:  Diagnosis Date   Alcohol abuse    Alcoholic cirrhosis ([place])    Anemia    Anxiety    Arthritis    "knees" (11/26/2018)   B12 deficiency    CAD (coronary artery  disease)    a. 11/10/2014 Cath: LM nl, LAD min irregs, D1 30 ost, D2 50d, LCX 85m, OM1 80 p/m (1.5 mm vessel), OM2 73m, RCA nondom 12m-->med rx.. Demand ischemia in the setting of rapid a-fib.   Cardiomyopathy ([place])    Carotid artery disease ([place])    a. 01/2015 Carotid Angio: RICA 426, LICA 83M; b. 11/9620 s/p L CEA; c. 05/2019 Carotid U/S: RICA 100. RECA >50. LICA 2-97%.   Cellulitis    lower extremities   CHF (congestive heart failure) ([name])    Chronic combined systolic and diastolic CHF (congestive heart failure) ([place])    a. 10/2014 Echo: EF 40-45%; b. 10/2018 Echo: EF 45-50%, gr2 DD; c. 11/2019 Echo: EF 50%, mild LVH, gr2 DD (restrictive), antlat HK, Nl RV fxn. Mild BAE. RVSP 59.46mmHg.   CKD (chronic kidney disease), stage III ([name])    Cocaine abuse ([name])    COPD (chronic obstructive pulmonary disease) ([name])    Critical lower limb ischemia ([name])    Depression    Diabetes mellitus without complication ([name])    Diabetic peripheral neuropathy ([name])    DVT (deep venous thrombosis) ([name])    Dyspnea    Elevated troponin    a. Chronic elevation.   GERD (gastroesophageal reflux disease)    Hyperlipemia    Hypertension    Hypokalemia    Hypomagnesemia    Hypothyroidism    Marijuana abuse    Narcotic abuse ([place])    Noncompliance  NSVT (nonsustained ventricular tachycardia)    Obesity    PAF (paroxysmal atrial fibrillation) ([name])    Paroxysmal atrial tachycardia ([name])    Peripheral arterial disease ([place])    a. 01/2015 Angio/PTA: RSFA 99 (atherectomy/pta) - 1 vessel runoff via diff dzs peroneal; b. 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   Pneumonia    "once or twice" (11/26/2018)   Poorly controlled type 2 diabetes mellitus ([place])    Renal disorder    Renal insufficiency    a. 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   Sleep apnea    "couldn't handle wearing the mask" (11/26/2018)   Symptomatic bradycardia    a. Avoid AV blocking agent per EP. Prev req temp wire in 2017.    Tobacco abuse    Allergies  Allergen Reactions   Gabapentin Nausea And Vomiting and Other (See Comments)    POSSIBLE SHAKING   Lyrica [Pregabalin] Other (See Comments)    Shaking     Current Outpatient Medications on File Prior to Visit  Medication Sig Dispense Refill   ACCU-CHEK GUIDE test strip USE TO CHECK BLOOD SUGAR FOUR TIMES DAILY     acetaminophen (TYLENOL) 500 MG tablet Take 500 mg by mouth every 6 (six) hours as needed for moderate pain.     albuterol (VENTOLIN HFA) 108 (90 Base) MCG/ACT inhaler Inhale 2 puffs into the lungs every 6 (six) hours as needed for wheezing or shortness of breath. 18 g 3   allopurinol (ZYLOPRIM) 100 MG tablet Take 1 tablet (100 mg total) by mouth daily. 30 tablet 6   amiodarone (PACERONE) 200 MG tablet TAKE 1/2 TABLET (100 MG TOTAL) BY MOUTH DAILY. 15 tablet 3   amLODipine (NORVASC) 10 MG tablet Take 1 tablet (10 mg total) by mouth daily. 90 tablet 3   apixaban (ELIQUIS) 5 MG TABS tablet Take 5 mg by mouth 2 (two) times daily.     atorvastatin (LIPITOR) 40 MG tablet Take 1 tablet (40 mg total) by mouth every evening. 90 tablet 1   benzonatate (TESSALON) 100 MG capsule Take 1 capsule (100 mg total) by mouth 2 (two) times daily as needed for cough. (Patient not taking: Reported on 11/04/2021) 40 capsule 0   budesonide-formoterol (SYMBICORT) 160-4.5 MCG/ACT inhaler Inhale 2 puffs into the lungs 2 (two) times daily. 1 Inhaler 2   buPROPion (WELLBUTRIN SR) 150 MG 12 hr tablet Take 1 tablet by mouth daily for 3 days then increase to 1 tablet by mouth 2 times daily. 60 tablet 2   clopidogrel (PLAVIX) 75 MG tablet Take 1 tablet (75 mg total) by mouth daily. 30 tablet 11   colchicine 0.6 MG tablet Take 2 tabs (1.2 mg) at the onset of a gout flare, may take 1 tab (0.6 mg) in 1 hour if symptoms 30 tablet 3   Continuous Blood Gluc Sensor (FREESTYLE LIBRE 2 SENSOR) MISC USE 1 (ONE) EACH EVERY 2 WEEKS (Patient not taking: Reported on 11/11/2021) 2 each 11    Dulaglutide (TRULICITY) 1.5 VZ/8.5YI SOPN Inject 1.5 mg into the skin once a week. (Patient not taking: Reported on 11/11/2021) 2 mL 6   empagliflozin (JARDIANCE) 10 MG TABS tablet Take 1 tablet (10 mg total) by mouth daily before breakfast. 90 tablet 3   ferrous sulfate 324 MG TBEC Take 324 mg by mouth.     FLUoxetine (PROZAC) 20 MG capsule Take 1 capsule (20 mg total) by mouth at bedtime. 90 capsule 1   folic acid (  FOLVITE) 1 MG tablet Take 1 tablet (1 mg total) by mouth daily. 90 tablet 1   hydrALAZINE (APRESOLINE) 50 MG tablet Take 1 tablet (50 mg total) by mouth 3 (three) times daily. 90 tablet 6   HYDROcodone-acetaminophen (NORCO/VICODIN) 5-325 MG tablet Take 1 tablet by mouth every 4 (four) hours as needed. (Patient not taking: Reported on 11/11/2021) 10 tablet 0   insulin glargine (LANTUS) 100 UNIT/ML Solostar Pen Inject 30 Units into the skin daily. 30 mL 6   insulin lispro (HUMALOG) 100 UNIT/ML KwikPen Inject 8 Units into the skin 3 (three) times daily with meals. 15 mL 0   Insulin Pen Needle 32G X 4 MM MISC USE AS DIRECTED WITH INSULIN PENS 100 each 0   levothyroxine (SYNTHROID) 50 MCG tablet Take 1 tablet (50 mcg total) by mouth daily. 90 tablet 1   losartan (COZAAR) 25 MG tablet Take 2 tablets (50 mg total) by mouth daily. (Patient taking differently: Take 50 mg by mouth at bedtime.) 180 tablet 1   Misc. Devices MISC 2 L oxygen continuously via nasal cannula.  Diagnosis chronic respiratory failure with hypoxia (Patient not taking: Reported on 11/04/2021) 1 each 0   Omega-3 Fatty Acids (FISH OIL PO) Take 1 capsule by mouth daily.     ondansetron (ZOFRAN) 4 MG tablet Take 1 tablet (4 mg total) by mouth every 8 (eight) hours as needed for nausea or vomiting. 20 tablet 0   pantoprazole (PROTONIX) 40 MG tablet Take 1 tablet (40 mg total) by mouth daily. 90 tablet 0   potassium chloride SA (KLOR-CON M20) 20 MEQ tablet Take 1 tablet (20 mEq total) by mouth daily. 90 tablet 3   torsemide  (DEMADEX) 20 MG tablet Take 3 tablets (60 mg total) by mouth 2 (two) times daily. 240 tablet 11   VITAMIN D PO Take 1 tablet by mouth daily.     [DISCONTINUED] tiotropium (SPIRIVA HANDIHALER) 18 MCG inhalation capsule Place 1 capsule (18 mcg total) into inhaler and inhale every morning. (Patient not taking: Reported on 02/09/2021) 30 capsule 2   No current facility-administered medications on file prior to visit.    ROS: See HPI  Observations/Objective: Awake, alert, oriented x3 Not in acute distress Normal mood   CMP Latest Ref Rng & Units 11/02/2021 10/19/2021 09/08/2021  Glucose 70 - 99 mg/dL 254(H) 82 147(H)  BUN 6 - 20 mg/dL 36(H) 25(H) 29(H)  Creatinine 0.44 - 1.00 mg/dL 1.81(H) 1.28(H) 1.92(H)  Sodium 135 - 145 mmol/L 136 138 135  Potassium 3.5 - 5.1 mmol/L 4.1 3.6 4.3  Chloride 98 - 111 mmol/L 99 99 97(L)  CO2 22 - 32 mmol/L 25 29 31   Calcium 8.9 - 10.3 mg/dL 8.6(L) 8.9 8.8(L)  Total Protein 6.5 - 8.1 g/dL - 7.2 -  Total Bilirubin 0.3 - 1.2 mg/dL - 0.5 -  Alkaline Phos 38 - 126 U/L - 155(H) -  AST 15 - 41 U/L - 20 -  ALT 0 - 44 U/L - 20 -    Lipid Panel     Component Value Date/Time   CHOL 129 03/08/2021 1551   TRIG 115 03/08/2021 1551   HDL 47 03/08/2021 1551   CHOLHDL 2.7 03/08/2021 1551   VLDL 23 03/08/2021 1551   LDLCALC 59 03/08/2021 1551    Lab Results  Component Value Date   HGBA1C 10.5 (A) 08/11/2021    Assessment and Plan: 1. DM (diabetes mellitus), type 2 with peripheral vascular complications ([name]) Uncontrolled with A1c of 10.5 Blood  sugar log reveals improvement She is due for an A1c Advised that at Cardiology visit in 2 weeks she can have Cardiology send off an A1c and forward to me as her visit is in 2 weeks Will have the PharmD assist with the CGM  2. Tobacco abuse Unable to tolerate Bupropion She is working on quitting on her own   Follow Up Instructions: 3 months   I discussed the assessment and treatment plan with the patient.  The patient was provided an opportunity to ask questions and all were answered. The patient agreed with the plan and demonstrated an understanding of the instructions.   The patient was advised to call back or seek an in-person evaluation if the symptoms worsen or if the condition fails to improve as anticipated.     I provided 11 minutes total of non-face-to-face time during this encounter.   Charlott Rakes, MD, FAAFP. [facility] [place], [place]   11/16/2021, 3:38 PM

## 2021-11-16 NOTE — Telephone Encounter (Signed)
Can you please assist with rx for CGM? Thanks.

## 2021-11-16 NOTE — Progress Notes (Signed)
°  Cardiomems Remote Monitoring.     Implanted 02/04/21 I have reviewed weekly PAD readings from cardiomems device. PAD reading stable.   PAD Goal 20  Current PAD 21   Continue current diuretic regimen. No change indicated.   Follow weekly readings.    Jonathon Tan Np-C  3:45 PM

## 2021-11-17 NOTE — Addendum Note (Signed)
Addended by: Daisy Blossom, Annie Main L on: 11/17/2021 09:19 AM   Modules accepted: Level of Service

## 2021-11-17 NOTE — Telephone Encounter (Signed)
Call placed to [facility]. Patient has a part D plan. The pharmacist informed that the plan covered her sensor. Pt called and informed. She will pick this up today.

## 2021-11-18 ENCOUNTER — Other Ambulatory Visit (HOSPITAL_COMMUNITY): Payer: Self-pay

## 2021-11-18 NOTE — Progress Notes (Signed)
Paramedicine Encounter    Patient ID: [name], female    DOB: 1961-05-24, 61 y.o.   MRN: [id number]  [name] out for med rec for [name]. Pillbox was needing 1/2 tab amiodarone and hydralazine throughout her pill box. No further needed for her. [name] will follow up next week.     ACTION: Home visit completed

## 2021-11-25 ENCOUNTER — Other Ambulatory Visit (HOSPITAL_COMMUNITY): Payer: Self-pay

## 2021-11-25 NOTE — Progress Notes (Signed)
Paramedicine Encounter    Patient ID: [name], female    DOB: 1961/10/15, 61 y.o.   MRN: [id number]  Pt reports doing ok, except for her chronic foot pain.  Denies increased sob, no c/p, no dizziness.  she reports she has quit smoking now.  Very proud of her for that.  Depending on how her appoint goes on 1/20 we can move forward with bubble packs to d/c pt.    Refills needed: Allopurinol Amio (LF 12/29) Clopidogrel Hydralazine (LF 01/02)  She needs folic acid and bupropion now in her pill boxes. Ordered that from pharmacy, It will be ready today and I will go get it and bring back this afternoon.  Meds verified and 2 wks of pill boxes filled.   CBG PTA-141  BP 132/60    Pulse 78    Resp 18    Wt 213 lb (96.6 kg)    LMP  (LMP Unknown)    SpO2 97%    BMI 36.56 kg/m  Weight yesterday-213 Last visit weight-207   Patient Care Team: Charlott Rakes, MD as PCP - General (Family Medicine) [name], MD as PCP - Cardiology (Cardiology) [name], MD as PCP - Electrophysiology (Cardiology) [name], MD as Consulting Physician (Cardiology) [name], MD as Consulting Physician (Pulmonary Disease) System, Provider Not In  Patient Active Problem List   Diagnosis Date Noted   Hyperlipidemia associated with type 2 diabetes mellitus ([place]) 07/29/2021   (HFpEF) heart failure with preserved ejection fraction ([place]) 07/29/2021   Bradycardia 06/13/2021   Uncontrolled type 2 diabetes mellitus with hyperglycemia ([place]) 06/13/2021   Acute kidney injury superimposed on CKD ([place]) 06/13/2021   Critical ischemia of foot ([place]) 06/07/2021   Fall 05/05/2021   PAF (paroxysmal atrial fibrillation) ([place]) 05/05/2021   COPD (chronic obstructive pulmonary disease) ([place]) 05/05/2021   DM (diabetes mellitus), type 2 with complications ([place]) 72/53/6644   PAD (peripheral artery disease) ([place]) 05/05/2021   Cellulitis 05/05/2021   Acute on chronic combined systolic and  diastolic CHF (congestive heart failure) ([place]) 05/05/2021   OSA (obstructive sleep apnea) 05/05/2021   CKD (chronic kidney disease) stage 3, GFR 30-59 ml/min ([place]) 05/05/2021   AKI (acute kidney injury) ([place]) 05/04/2021   Pressure injury of skin 05/04/2021   Ulcer of foot, unspecified laterality, limited to breakdown of skin ([place]) 02/17/2021   Altered mental status 01/02/2021   AF (paroxysmal atrial fibrillation) ([place]) 01/02/2021   CAD (coronary artery disease) 01/02/2021   PVD (peripheral vascular disease) ([place]) 01/02/2021   Acute renal failure superimposed on stage 4 chronic kidney disease ([place]) 01/02/2021   Diabetes mellitus type 2, uncontrolled, with complications 03/47/4259   Diabetic nephropathy ([place]) 01/02/2021   Low back pain 06/01/2020   Hyperlipidemia 04/03/2020   Muscle weakness 03/20/2020   Closed fracture of neck of right radius 03/13/2020   Pain in elbow 03/12/2020   PAD (peripheral artery disease) ([place]) 12/26/2019   Acute renal failure ([name])    Acute respiratory failure with hypoxia ([place]) 12/02/2019   Acute exacerbation of CHF (congestive heart failure) ([place]) 12/01/2019   Cellulitis in diabetic foot ([place]) 07/08/2019   Osteomyelitis ([place]) 06/21/2019   NICM (nonischemic cardiomyopathy) ([place]) 06/20/2019   Non-healing ulcer ([place]) 06/20/2019   Acute CHF (congestive heart failure) ([place]) 11/26/2018   CHF (congestive heart failure) ([place]) 11/26/2018   Acute respiratory failure ([place]) 10/21/2018   AKI (acute kidney injury) ([place]) 08/18/2018   Coagulation disorder ([place]) 08/09/2017   Depression 07/21/2017  At risk for adverse drug reaction 06/20/2017   Peripheral neuropathy 06/20/2017   Acute osteomyelitis of right foot ([place]) 06/13/2017   S/P transmetatarsal amputation of foot, right ([place]) 06/05/2017   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 ([place]) 05/19/2017   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[place])    SIRS (systemic  inflammatory response syndrome) ([place]) 04/06/2017   CKD (chronic kidney disease), stage III ([place]) 11/24/2016   Suspected sleep apnea 11/24/2016   Ulcer of toe of right foot, with necrosis of bone ([place]) 10/27/2016   Ulcer of left lower leg ([place]) 05/19/2016   Severe obesity (BMI >= 40) ([place]) 02/24/2016   COPD GOLD 0  02/24/2016   Morbid obesity ([place]) 02/24/2016   Encounter for therapeutic drug monitoring 02/10/2016   Symptomatic bradycardia 01/12/2016   Hypertension associated with diabetes ([place]) 12/22/2015   Chronic combined systolic and diastolic CHF (congestive heart failure) ([place])    [place]    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 11/08/2015   Tobacco abuse 10/23/2015   Coronary artery disease    DOE (dyspnea on exertion) 04/29/2015   Diabetes mellitus type 2, uncontrolled 02/08/2015   Influenza A 02/07/2015   Wrist fracture, left, with routine healing, subsequent encounter 02/05/2015   Wrist fracture, left, closed, initial encounter 01/29/2015   PAF (paroxysmal atrial fibrillation) ([place]) 01/16/2015   Carotid arterial disease ([place]) 01/16/2015   Claudication ([place]) 01/15/2015   Demand ischemia ([place]) 10/29/2014   Insomnia 02/03/2014   COPD with acute exacerbation ([place]) 02/01/2014   S/P peripheral artery angioplasty - [place] atherectomy; R SFA 09/11/2013    Class: Acute   Leg pain, bilateral 08/19/2013   Hypothyroidism 07/31/2013   Cellulitis 06/13/2013   History of cocaine abuse ([place]) 06/13/2013   Long term current use of anticoagulant therapy 05/20/2013   Alcohol abuse    Narcotic abuse ([place])    Marijuana abuse    Alcoholic cirrhosis ([place])    DM (diabetes mellitus), type 2 with peripheral vascular complications ([name])     Current Outpatient Medications:    ACCU-CHEK GUIDE test strip, USE TO CHECK BLOOD SUGAR FOUR TIMES DAILY, Disp: , Rfl:    acetaminophen (TYLENOL) 500 MG tablet, Take 500 mg by mouth every 6 (six) hours as needed for moderate pain., Disp: , Rfl:    albuterol (VENTOLIN  HFA) 108 (90 Base) MCG/ACT inhaler, Inhale 2 puffs into the lungs every 6 (six) hours as needed for wheezing or shortness of breath., Disp: 18 g, Rfl: 3   allopurinol (ZYLOPRIM) 100 MG tablet, Take 1 tablet (100 mg total) by mouth daily., Disp: 30 tablet, Rfl: 6   amiodarone (PACERONE) 200 MG tablet, TAKE 1/2 TABLET (100 MG TOTAL) BY MOUTH DAILY., Disp: 15 tablet, Rfl: 3   amLODipine (NORVASC) 10 MG tablet, Take 1 tablet (10 mg total) by mouth daily., Disp: 90 tablet, Rfl: 3   apixaban (ELIQUIS) 5 MG TABS tablet, Take 5 mg by mouth 2 (two) times daily., Disp: , Rfl:    atorvastatin (LIPITOR) 40 MG tablet, Take 1 tablet (40 mg total) by mouth every evening., Disp: 90 tablet, Rfl: 1   budesonide-formoterol (SYMBICORT) 160-4.5 MCG/ACT inhaler, Inhale 2 puffs into the lungs 2 (two) times daily., Disp: 1 Inhaler, Rfl: 2   buPROPion (WELLBUTRIN SR) 150 MG 12 hr tablet, Take 1 tablet by mouth daily for 3 days then increase to 1 tablet by mouth 2 times daily., Disp: 60 tablet, Rfl: 2   clopidogrel (PLAVIX) 75 MG tablet, Take 1 tablet (75  mg total) by mouth daily., Disp: 30 tablet, Rfl: 11   colchicine 0.6 MG tablet, Take 2 tabs (1.2 mg) at the onset of a gout flare, may take 1 tab (0.6 mg) in 1 hour if symptoms, Disp: 30 tablet, Rfl: 3   Dulaglutide (TRULICITY) 1.5 ZO/1.0RU SOPN, Inject 1.5 mg into the skin once a week., Disp: 2 mL, Rfl: 6   empagliflozin (JARDIANCE) 10 MG TABS tablet, Take 1 tablet (10 mg total) by mouth daily before breakfast., Disp: 90 tablet, Rfl: 3   ferrous sulfate 324 MG TBEC, Take 324 mg by mouth., Disp: , Rfl:    FLUoxetine (PROZAC) 20 MG capsule, Take 1 capsule (20 mg total) by mouth at bedtime., Disp: 90 capsule, Rfl: 1   folic acid (FOLVITE) 1 MG tablet, Take 1 tablet (1 mg total) by mouth daily., Disp: 90 tablet, Rfl: 1   hydrALAZINE (APRESOLINE) 50 MG tablet, Take 1 tablet (50 mg total) by mouth 3 (three) times daily., Disp: 90 tablet, Rfl: 6   insulin glargine (LANTUS) 100  UNIT/ML Solostar Pen, Inject 30 Units into the skin daily., Disp: 30 mL, Rfl: 6   insulin lispro (HUMALOG) 100 UNIT/ML KwikPen, Inject 8 Units into the skin 3 (three) times daily with meals., Disp: 15 mL, Rfl: 0   Insulin Pen Needle 32G X 4 MM MISC, USE AS DIRECTED WITH INSULIN PENS, Disp: 100 each, Rfl: 0   levothyroxine (SYNTHROID) 50 MCG tablet, Take 1 tablet (50 mcg total) by mouth daily., Disp: 90 tablet, Rfl: 1   Omega-3 Fatty Acids (FISH OIL PO), Take 1 capsule by mouth daily., Disp: , Rfl:    ondansetron (ZOFRAN) 4 MG tablet, Take 1 tablet (4 mg total) by mouth every 8 (eight) hours as needed for nausea or vomiting., Disp: 20 tablet, Rfl: 0   pantoprazole (PROTONIX) 40 MG tablet, Take 1 tablet (40 mg total) by mouth daily., Disp: 90 tablet, Rfl: 0   potassium chloride SA (KLOR-CON M20) 20 MEQ tablet, Take 1 tablet (20 mEq total) by mouth daily., Disp: 90 tablet, Rfl: 3   torsemide (DEMADEX) 20 MG tablet, Take 3 tablets (60 mg total) by mouth 2 (two) times daily., Disp: 240 tablet, Rfl: 11   VITAMIN D PO, Take 1 tablet by mouth daily., Disp: , Rfl:    benzonatate (TESSALON) 100 MG capsule, Take 1 capsule (100 mg total) by mouth 2 (two) times daily as needed for cough. (Patient not taking: Reported on 11/04/2021), Disp: 40 capsule, Rfl: 0   Continuous Blood Gluc Sensor (FREESTYLE LIBRE 2 SENSOR) MISC, USE 1 (ONE) EACH EVERY 2 WEEKS (Patient not taking: Reported on 11/11/2021), Disp: 2 each, Rfl: 11   HYDROcodone-acetaminophen (NORCO/VICODIN) 5-325 MG tablet, Take 1 tablet by mouth every 4 (four) hours as needed. (Patient not taking: Reported on 11/11/2021), Disp: 10 tablet, Rfl: 0   losartan (COZAAR) 25 MG tablet, Take 2 tablets (50 mg total) by mouth daily. (Patient taking differently: Take 50 mg by mouth at bedtime.), Disp: 180 tablet, Rfl: 1   Misc. Devices MISC, 2 L oxygen continuously via nasal cannula.  Diagnosis chronic respiratory failure with hypoxia (Patient not taking: Reported on  11/25/2021), Disp: 1 each, Rfl: 0 Allergies  Allergen Reactions   Gabapentin Nausea And Vomiting and Other (See Comments)    POSSIBLE SHAKING   Lyrica [Pregabalin] Other (See Comments)    Shaking       Social History   Socioeconomic History   Marital status: Single    Spouse name:  Not on file   Number of children: 1   Years of education: 30   Highest education level: 12th grade  Occupational History   Occupation: disabled  Tobacco Use   Smoking status: Every Day    Packs/day: 1.00    Years: 44.00    Pack years: 44.00    Types: E-cigarettes, Cigarettes   Smokeless tobacco: Former    Types: Snuff  Vaping Use   Vaping Use: Former   Devices: 11/26/2018 "stopped months ago"  Substance and Sexual Activity   Alcohol use: Yes    Comment: occ   Drug use: Yes    Types: "Crack" cocaine, Marijuana, Oxycodone   Sexual activity: Not Currently  Other Topics Concern   Not on file  Social History Narrative   ** Merged History Encounter **       Lives in [place], in motel with sister.  They are looking to move but don't have a place to go yet.     Social Determinants of Health   Financial Resource Strain: Not on file  Food Insecurity: Not on file  Transportation Needs: Not on file  Physical Activity: Not on file  Stress: Not on file  Social Connections: Not on file  Intimate Partner Violence: Not on file    Physical Exam      Future Appointments  Date Time Provider [place]  12/03/2021  2:00 PM [facility] PA/NP [facility] None  12/22/2021  1:00 PM [facility] VASC 3 [facility]  01/11/2022  3:30 PM [name], MD [facility] [place], [place] [facility] Paramedic  11/25/21

## 2021-11-25 NOTE — Progress Notes (Signed)
Went by pharmacy to p/u her folic acid and the wellbutrin and took to her and placed in both pill boxes.   Marylouise Stacks, EMT-Paramedic  11/25/21

## 2021-12-01 ENCOUNTER — Other Ambulatory Visit: Payer: Self-pay | Admitting: Cardiology

## 2021-12-01 NOTE — Progress Notes (Signed)
Patient ID: [name], female   DOB: 15-Aug-1961, 61 y.o.   MRN: [id number]    Advanced Heart Failure Clinic Note   PCP: [facility] PV: Dr. Gwenlyn Found HF Cardiology: Dr. Aundra Dubin  [name] is a 61 y.o. with history of PAD, carotid stenosis s/p left CEA, relatively mild CAD, chronic systolic CHF, paroxysmal atrial fibrillation and prior substance abuse presents for CHF clinic followup.  She was admitted in 1/17 with acute hypoxemic respiratory failure in the setting of atrial fibrillation/RVR and volume overload.  She was initially intubated.  She converted back to NSR with amiodarone gtt.  She was treated with IV Lasix, steroids, bronchodilators.  She developed AKI and losartan was stopped.    She was admitted in 3/17 with symptomatic bradycardia.  She was supposed to stop diltiazem after this admission but continued to take it.  She presented later in 3/17, again with symptomatic bradycardia (junctional bradycardia), hypotension, and AKI.  Diltiazem and bisoprolol were stopped.  HR recovered and she has had no problems since.  ACEI was stopped with AKI.   In 5/18, she had peripheral angiography with atherectomy of right SFA.  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 7/18 ABIs showed 0.65 on right, 0.31 on left.  In 8/20, she had peripheral angiography showing totally occluded left SFA.  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 Donnetta Hutching.  ABIs in 2/21 were 0.44 on right, 0.99 on left.   Echo in 1/21 showed EF 50%, mild LVH, normal RV, PASP 60 mmHg.   Echo 03/08/21 EF 55-60% with basal to mid inferolateral hypokinesis.   Admitted 6/22 for mechanical fall, found to have AKI on CKD. She was given IV lasix and her losartan was held.  Underwent PV angiogram with Dr. Gwenlyn Found 7/22. She had drug-coated balloon angioplasty of the right SFA after being admitted for critical ischemia of the right foot.  Repeat peripheral  arterial dopplers in 8/22 still showed severe right SFA disease.   Admitted 8/22 for bradycardia and AKI, bisoprolol subsequently stopped; amiodarone was continued.  Seen in ED 9/22 for N/V w/ black tarry stools after taking Pepto-Bismal, losartan stopped for unclear reasons.  She returns today for followup of CHF. She is still smoking 1/2 ppd.  She has a right foot ulcer, seeing wound clinic.  Not walking much due to ulcer.  Her right foot hurts.  No chest pain.  No dyspnea walking a short distance.  No orthopnea/PND.  Weight down 1 lb.    Today she returns for HF follow up. Main complaint is N/V after starting Wellbutrin, however she is 3 days w/o a cigarette. Breathing is about the same, no significant dyspnea on flat ground with her rolling walker. Denies CP, dizziness, edema, or PND/Orthopnea. Appetite ok. No fever or chills. Weight at home 218 pounds. Taking all medications. Leg ulcer has resolved.  Improving compliance with her Cardiomems.  ECG (personally reviewed): none ordered today.  Cardiomems: Today's reading showed PADP 17 (goal 20).  Labs (1/17): K 5.2, creatinine 1.4, AST 43, ALT normal Labs (2/17): K 4.2, creatinine 1.3, BNP 607, TSH normal Labs (3/17): K 4.2, creatinine 1.45, AST/ALT normal, HCT 28.7 Labs (4/17): K 4.4, creatinine 1.61 Labs (08/31/2016): K 4.5 Creatinine 1.43  Labs (11/17): K 4.2, creatinine 1.4 Labs (9/18): K 4.1, creatinine 1.1, TSH normal Labs 04/13/2018: K 5 Creatinine 1.75 Labs (2/21): K 4.1, creatinine 1.58 Labs (4/22): K  3.7, creatinine 1.55 Labs (5/22): K 3.8, creatinine 1.54 Labs (8/22): K 4.0, creatinine 2.07 Labs (11/22): TSH normal, LDL 59 Labs (12/22): K 3.6, creatinine 1.28, BNP 256, AST/ALT normal   PMH: 1. Carotid stenosis: Known occluded right carotid.  Left CEA in 4/16.  - Carotid dopplers (7/21): CTO RICA, mild disease LICA.  - Carotid dopplers (7/22): CTO RICA, 8-09% LICA.  2. CAD: LHC in 12/15 with 80% stenosis in small OM1,  nonobstructive disease in other territories.  3. Chronic systolic CHF: Nonischemic cardiomyopathy (?due to ETOH or prior drug abuse).  She has a Cardiomems.  - Echo (1/17) with EF 45%, mild LV hypertrophy, moderate diastolic dysfunction, inferolateral severe hypokinesis, mildly decreased RV systolic function.  - Echo (1/18): EF 40-45%, mild LVH, normal RV size and systolic function.  - Echo (1/21): EF 50%, mild LVH, normal RV, PASP 60 mmHg.  - Echo (4/22): EF 55-60%, basal-mid inferolateral hypokinesis with grade II diastolic dysfunction, RV normal, PASP 42 4. Atrial fibrillation: Paroxysmal.   5. Type II diabetes 6. CKD: Stage III. 7. COPD: Smokes 1 ppd.  8. 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  No longer drinks.  9. Hypothyroidism 10. PAD: Atherectomy SFA in 2014 (Dr Gwenlyn Found).  Peripheral arterial dopplers (2/17) with focal 75-99% proximal right SFA stenosis, occluded mid-distal right SFA, chronic occlusion of all runoff arteries on right. - In 5/18, she had peripheral angiography with atherectomy of right SFA.   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  - 7/18 ABIs showed 0.65 on right, 0.31 on left.   - Peripheral angiography 8/20: Totally occluded left SFA.  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 Donnetta Hutching.   - ABIs (2/21): 0.44 R, 0.99 L.  - Balloon angioplasty right SFA 7/22.  - ABIs (8/22): severe right SFA disease.  11. Anemia 12. Prior cocaine abuse.  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  14. OSA: Has not been able to tolerate CPAP.   SH: Lives with sister.  Prior cocaine abuse.  Prior ETOH abuse.  Quit smoking in 1/17, restarted, and quit again in 4/19, restarted again.  FH: CAD  Review of systems complete and found to be negative unless listed in HPI.    Current Outpatient Medications  Medication Sig Dispense Refill   ACCU-CHEK GUIDE test strip USE TO CHECK BLOOD  SUGAR FOUR TIMES DAILY     acetaminophen (TYLENOL) 500 MG tablet Take 500 mg by mouth every 6 (six) hours as needed for moderate pain.     albuterol (VENTOLIN HFA) 108 (90 Base) MCG/ACT inhaler Inhale 2 puffs into the lungs every 6 (six) hours as needed for wheezing or shortness of breath. 18 g 3   allopurinol (ZYLOPRIM) 100 MG tablet Take 1 tablet (100 mg total) by mouth daily. 30 tablet 6   amiodarone (PACERONE) 200 MG tablet TAKE 1/2 TABLET (100 MG TOTAL) BY MOUTH DAILY. 15 tablet 3   amLODipine (NORVASC) 10 MG tablet Take 1 tablet (10 mg total) by mouth daily. 90 tablet 3   apixaban (ELIQUIS) 5 MG TABS tablet Take 5 mg by mouth 2 (two) times daily.     atorvastatin (LIPITOR) 40 MG tablet Take 1 tablet (40 mg total) by mouth every evening. 90 tablet 1   benzonatate (TESSALON) 100 MG capsule Take 1 capsule (100 mg total) by mouth 2 (two) times daily as needed for cough. (Patient not taking: Reported  on 11/04/2021) 40 capsule 0   budesonide-formoterol (SYMBICORT) 160-4.5 MCG/ACT inhaler Inhale 2 puffs into the lungs 2 (two) times daily. 1 Inhaler 2   buPROPion (WELLBUTRIN SR) 150 MG 12 hr tablet Take 1 tablet by mouth daily for 3 days then increase to 1 tablet by mouth 2 times daily. 60 tablet 2   clopidogrel (PLAVIX) 75 MG tablet Take 1 tablet (75 mg total) by mouth daily. 30 tablet 11   colchicine 0.6 MG tablet Take 2 tabs (1.2 mg) at the onset of a gout flare, may take 1 tab (0.6 mg) in 1 hour if symptoms 30 tablet 3   Continuous Blood Gluc Sensor (FREESTYLE LIBRE 2 SENSOR) MISC USE 1 (ONE) EACH EVERY 2 WEEKS (Patient not taking: Reported on 11/11/2021) 2 each 11   Dulaglutide (TRULICITY) 1.5 FH/5.4TG SOPN Inject 1.5 mg into the skin once a week. 2 mL 6   empagliflozin (JARDIANCE) 10 MG TABS tablet Take 1 tablet (10 mg total) by mouth daily before breakfast. 90 tablet 3   ferrous sulfate 324 MG TBEC Take 324 mg by mouth.     FLUoxetine (PROZAC) 20 MG capsule Take 1 capsule (20 mg total) by mouth  at bedtime. 90 capsule 1   folic acid (FOLVITE) 1 MG tablet Take 1 tablet (1 mg total) by mouth daily. 90 tablet 1   hydrALAZINE (APRESOLINE) 50 MG tablet Take 1 tablet (50 mg total) by mouth 3 (three) times daily. 90 tablet 6   HYDROcodone-acetaminophen (NORCO/VICODIN) 5-325 MG tablet Take 1 tablet by mouth every 4 (four) hours as needed. (Patient not taking: Reported on 11/11/2021) 10 tablet 0   insulin glargine (LANTUS) 100 UNIT/ML Solostar Pen Inject 30 Units into the skin daily. 30 mL 6   insulin lispro (HUMALOG) 100 UNIT/ML KwikPen Inject 8 Units into the skin 3 (three) times daily with meals. 15 mL 0   Insulin Pen Needle 32G X 4 MM MISC USE AS DIRECTED WITH INSULIN PENS 100 each 0   levothyroxine (SYNTHROID) 50 MCG tablet Take 1 tablet (50 mcg total) by mouth daily. 90 tablet 1   losartan (COZAAR) 25 MG tablet Take 2 tablets (50 mg total) by mouth daily. (Patient taking differently: Take 50 mg by mouth at bedtime.) 180 tablet 1   Misc. Devices MISC 2 L oxygen continuously via nasal cannula.  Diagnosis chronic respiratory failure with hypoxia (Patient not taking: Reported on 11/25/2021) 1 each 0   Omega-3 Fatty Acids (FISH OIL PO) Take 1 capsule by mouth daily.     ondansetron (ZOFRAN) 4 MG tablet Take 1 tablet (4 mg total) by mouth every 8 (eight) hours as needed for nausea or vomiting. 20 tablet 0   pantoprazole (PROTONIX) 40 MG tablet Take 1 tablet (40 mg total) by mouth daily. 90 tablet 0   potassium chloride SA (KLOR-CON M20) 20 MEQ tablet Take 1 tablet (20 mEq total) by mouth daily. 90 tablet 3   torsemide (DEMADEX) 20 MG tablet Take 3 tablets (60 mg total) by mouth 2 (two) times daily. 240 tablet 11   VITAMIN D PO Take 1 tablet by mouth daily.     No current facility-administered medications for this visit.   LMP  (LMP Unknown)   Wt Readings from Last 3 Encounters:  11/25/21 96.6 kg (213 lb)  11/11/21 93.9 kg (207 lb)  11/04/21 94.5 kg (208 lb 4.8 oz)    Physical Exam: General:   NAD. No resp difficulty, walked into clinic with RW HEENT: Normal Neck:  Supple. No JVD. Carotids 2+ bilat; no bruits. No lymphadenopathy or thryomegaly appreciated. Cor: PMI nondisplaced. Regular rate & rhythm. No rubs, gallops or murmurs. Lungs: Faint bilateral wheezes Abdomen: Soft, nontender, nondistended. No hepatosplenomegaly. No bruits or masses. Good bowel sounds. Extremities: No cyanosis, clubbing, rash, edema Neuro: Alert & oriented x 3, cranial nerves grossly intact. Moves all 4 extremities w/o difficulty. Affect pleasant.  Assessment/Plan: 1. Chronic diastolic CHF: LHC in 09/81 showing only 80% stenosis in small OM1. EF as low as 40-45% in the past. Echo 4/22 showed EF 55-60% with grade 2 diastolic dysfunction.   Not very active, NYHA class II-III symptoms. She is not volume overloaded on exam or by CardioMems. - Continue daily Cardiomems. - Continue losartan 50 mg daily. BMET today. - Continue torsemide 60 mg bid. - Continue Jardiance 10 mg daily. No GU symptoms. - Off bisoprolol due to bradycardia.   2. Atrial fibrillation: Paroxysmal. Episode of Afib w/ RVR during 07/29/21 ED visit, she spontaneously converted. She is regular on exam today. - Continue amiodarone 100 mg daily. Recent LFTs/ TFTs normal.  She is on levothyroxine, followed by PCP.  Needs regular eye exam,  - Continue Eliquis 5 mg bid.   3. PAD: Claudication bilaterally.  Not candidate for cilostazol with CHF.  She has had a left fem-pop bypass in 8/20.  ABIs in 2/21 were very abnormal on right. She underwent PV angiogram with balloon angioplasty of the right SFA 7/22.  8/22 dopplers still showed severe right SFA disease and she had a right foot ulceration and was being seen in [facility].  - She has followup arranged with Dr. Gwenlyn Found.  - Continue atorvastatin. LDL at goal in 11/22.  - She is currently on Plavix along with Eliquis since 7/22 angioplasty, would plan to continue 6 months then stop (1/23).  4. COPD:   Continues to smoke, encouraged to quit.   - Can try cutting Wellbutrin to 1/2 tablet to see if this helps with N/V side effects. 5. H/o cirrhosis: Likely from ETOH, she has quit.  6. OSA: Not able to tolerate CPAP.   7. CKD stage III:  Check BMET.  8. HTN: Controlled.  9.Type 2 diabetes: Per PCP.   10. CAD: LHC in 12/15 with 80% stenosis in small OM1, nonobstructive disease in other territories. No chest pain. - Continue statin.  - Continue Eliquis.  Follow up with APP in 2 months and Dr. Aundra Dubin in 4 months.  [place], [place] 12/01/2021

## 2021-12-03 ENCOUNTER — Other Ambulatory Visit: Payer: Self-pay

## 2021-12-03 ENCOUNTER — Encounter (HOSPITAL_COMMUNITY): Payer: Self-pay

## 2021-12-03 ENCOUNTER — Ambulatory Visit (HOSPITAL_COMMUNITY)
Admission: RE | Admit: 2021-12-03 | Discharge: 2021-12-03 | Disposition: A | Discharge status: Home / Self Care | Payer: Medicare Other | Source: Ambulatory Visit | Attending: Family Medicine | Admitting: Family Medicine

## 2021-12-03 VITALS — BP 100/60 | HR 94 | Wt 214.4 lb

## 2021-12-03 DIAGNOSIS — N1831 Chronic kidney disease, stage 3a: Secondary | ICD-10-CM | POA: Diagnosis not present

## 2021-12-03 DIAGNOSIS — Z7984 Long term (current) use of oral hypoglycemic drugs: Secondary | ICD-10-CM | POA: Insufficient documentation

## 2021-12-03 DIAGNOSIS — Z7902 Long term (current) use of antithrombotics/antiplatelets: Secondary | ICD-10-CM | POA: Insufficient documentation

## 2021-12-03 DIAGNOSIS — G4733 Obstructive sleep apnea (adult) (pediatric): Secondary | ICD-10-CM | POA: Diagnosis not present

## 2021-12-03 DIAGNOSIS — I130 Hypertensive heart and chronic kidney disease with heart failure and stage 1 through stage 4 chronic kidney disease, or unspecified chronic kidney disease: Secondary | ICD-10-CM | POA: Diagnosis not present

## 2021-12-03 DIAGNOSIS — I10 Essential (primary) hypertension: Secondary | ICD-10-CM

## 2021-12-03 DIAGNOSIS — E1122 Type 2 diabetes mellitus with diabetic chronic kidney disease: Secondary | ICD-10-CM | POA: Diagnosis not present

## 2021-12-03 DIAGNOSIS — Z79899 Other long term (current) drug therapy: Secondary | ICD-10-CM | POA: Insufficient documentation

## 2021-12-03 DIAGNOSIS — I5042 Chronic combined systolic (congestive) and diastolic (congestive) heart failure: Secondary | ICD-10-CM | POA: Diagnosis not present

## 2021-12-03 DIAGNOSIS — K7460 Unspecified cirrhosis of liver: Secondary | ICD-10-CM | POA: Diagnosis not present

## 2021-12-03 DIAGNOSIS — I480 Paroxysmal atrial fibrillation: Secondary | ICD-10-CM | POA: Diagnosis not present

## 2021-12-03 DIAGNOSIS — N183 Chronic kidney disease, stage 3 unspecified: Secondary | ICD-10-CM | POA: Diagnosis not present

## 2021-12-03 DIAGNOSIS — I2510 Atherosclerotic heart disease of native coronary artery without angina pectoris: Secondary | ICD-10-CM

## 2021-12-03 DIAGNOSIS — Z8719 Personal history of other diseases of the digestive system: Secondary | ICD-10-CM | POA: Diagnosis not present

## 2021-12-03 DIAGNOSIS — I739 Peripheral vascular disease, unspecified: Secondary | ICD-10-CM

## 2021-12-03 DIAGNOSIS — J449 Chronic obstructive pulmonary disease, unspecified: Secondary | ICD-10-CM | POA: Diagnosis not present

## 2021-12-03 DIAGNOSIS — Z7901 Long term (current) use of anticoagulants: Secondary | ICD-10-CM | POA: Insufficient documentation

## 2021-12-03 DIAGNOSIS — E1151 Type 2 diabetes mellitus with diabetic peripheral angiopathy without gangrene: Secondary | ICD-10-CM | POA: Diagnosis not present

## 2021-12-03 DIAGNOSIS — Z79890 Hormone replacement therapy (postmenopausal): Secondary | ICD-10-CM | POA: Insufficient documentation

## 2021-12-03 LAB — BASIC METABOLIC PANEL
Anion gap: 10 (ref 5–15)
BUN: 42 mg/dL — ABNORMAL HIGH (ref 6–20)
CO2: 29 mmol/L (ref 22–32)
Calcium: 8.7 mg/dL — ABNORMAL LOW (ref 8.9–10.3)
Chloride: 96 mmol/L — ABNORMAL LOW (ref 98–111)
Creatinine, Ser: 1.93 mg/dL — ABNORMAL HIGH (ref 0.44–1.00)
GFR, Estimated: 29 mL/min — ABNORMAL LOW (ref >60)
Glucose, Bld: 282 mg/dL — ABNORMAL HIGH (ref 70–99)
Potassium: 3.9 mmol/L (ref 3.5–5.1)
Sodium: 135 mmol/L (ref 135–145)

## 2021-12-03 NOTE — Patient Instructions (Signed)
Thank you for coming in today  Labs were done today, if any labs are abnormal the clinic will call you  Your physician recommends that you schedule a follow-up appointment in:  2 months in clinic  4 months with Dr. Aundra Dubin  At the [facility], you and your health needs are our priority. As part of our continuing mission to provide you with exceptional heart care, we have created designated Provider Care Teams. These Care Teams include your primary Cardiologist (physician) and Advanced Practice Providers (APPs- Physician Assistants and Nurse Practitioners) who all work together to provide you with the care you need, when you need it.   You may see any of the following providers on your designated Care Team at your next follow up: Dr Glori Bickers Dr Haynes Kerns, NP Lyda Jester, [place] [facility] [place], [place] Audry Riles, PharmD   Please be sure to bring in all your medications bottles to every appointment.   If you have any questions or concerns before your next appointment please send [place] a message through [place] or call our office at [phone].    TO LEAVE A MESSAGE FOR THE NURSE SELECT OPTION 2, PLEASE LEAVE A MESSAGE INCLUDING: YOUR NAME DATE OF BIRTH CALL BACK NUMBER REASON FOR CALL**this is important as we prioritize the call backs  YOU WILL RECEIVE A CALL BACK THE SAME DAY AS LONG AS YOU CALL BEFORE 4:00 PM

## 2021-12-08 ENCOUNTER — Encounter (HOSPITAL_COMMUNITY): Payer: Self-pay

## 2021-12-08 ENCOUNTER — Telehealth (HOSPITAL_COMMUNITY): Payer: Self-pay

## 2021-12-08 NOTE — Telephone Encounter (Addendum)
Attempted to call patient on numerous occasions without any success. Voice messages left for her asking to return our calls. Not done, so letter sent   ----- Message from Rafael Bihari, FNP sent at 12/03/2021  3:42 PM EST ----- Kidney function mildly elevated from baseline. Please repeat BMET in 2 weeks.

## 2021-12-09 ENCOUNTER — Other Ambulatory Visit (HOSPITAL_COMMUNITY): Payer: Self-pay | Admitting: Cardiology

## 2021-12-09 ENCOUNTER — Other Ambulatory Visit: Payer: Self-pay | Admitting: Family Medicine

## 2021-12-09 ENCOUNTER — Other Ambulatory Visit (HOSPITAL_COMMUNITY): Payer: Self-pay

## 2021-12-09 DIAGNOSIS — M1A0410 Idiopathic chronic gout, right hand, without tophus (tophi): Secondary | ICD-10-CM

## 2021-12-09 NOTE — Progress Notes (Addendum)
Paramedicine Encounter    Patient ID: [name], female    DOB: Mar 15, 1961, 61 y.o.   MRN: [id number]  Pt reports she is doing well. She denies any increased sob, no dizziness, no c/p.  Her GI symptoms are better.     Refills needed for next visit:  Eliquis-needs new rx  Wellbutrin (LF 0/32) Folic acid  (LF 1/22)   Refills needed now: Allopurinol Clopidogrel Amio Torsemide Trulicity   These [facility] to [facility] and [facility] will fix them today and I will get them this afternoon.  Still not smoking. Lungs still wheezy.  I went to p/u meds and brought them back to her later in the day.  She now has 2 full wks of pill boxes.  Will see if next time she can get started on bubble packs.   BP 138/68    Pulse 72    Resp 18    Wt 211 lb (95.7 kg)    LMP  (LMP Unknown)    SpO2 96%    BMI 36.22 kg/m  Weight yesterday-211 Last visit weight-213  Patient Care Team: Charlott Rakes, MD as PCP - General (Family Medicine) [name], MD as PCP - Cardiology (Cardiology) [name], MD as PCP - Electrophysiology (Cardiology) [name], MD as Consulting Physician (Cardiology) [name], MD as Consulting Physician (Pulmonary Disease) System, Provider Not In  Patient Active Problem List   Diagnosis Date Noted   Hyperlipidemia associated with type 2 diabetes mellitus ([place]) 07/29/2021   (HFpEF) heart failure with preserved ejection fraction ([place]) 07/29/2021   Bradycardia 06/13/2021   Uncontrolled type 2 diabetes mellitus with hyperglycemia ([place]) 06/13/2021   Acute kidney injury superimposed on CKD ([place]) 06/13/2021   Critical ischemia of foot ([place]) 06/07/2021   Fall 05/05/2021   PAF (paroxysmal atrial fibrillation) ([place]) 05/05/2021   COPD (chronic obstructive pulmonary disease) ([place]) 05/05/2021   DM (diabetes mellitus), type 2 with complications ([place]) 48/25/0037   PAD (peripheral artery disease) ([place]) 05/05/2021   Cellulitis 05/05/2021   Acute on chronic  combined systolic and diastolic CHF (congestive heart failure) ([place]) 05/05/2021   OSA (obstructive sleep apnea) 05/05/2021   CKD (chronic kidney disease) stage 3, GFR 30-59 ml/min ([place]) 05/05/2021   AKI (acute kidney injury) ([place]) 05/04/2021   Pressure injury of skin 05/04/2021   Ulcer of foot, unspecified laterality, limited to breakdown of skin ([place]) 02/17/2021   Altered mental status 01/02/2021   AF (paroxysmal atrial fibrillation) ([place]) 01/02/2021   CAD (coronary artery disease) 01/02/2021   PVD (peripheral vascular disease) ([place]) 01/02/2021   Acute renal failure superimposed on stage 4 chronic kidney disease ([place]) 01/02/2021   Diabetes mellitus type 2, uncontrolled, with complications 04/88/8916   Diabetic nephropathy ([place]) 01/02/2021   Low back pain 06/01/2020   Hyperlipidemia 04/03/2020   Muscle weakness 03/20/2020   Closed fracture of neck of right radius 03/13/2020   Pain in elbow 03/12/2020   PAD (peripheral artery disease) ([place]) 12/26/2019   Acute renal failure ([name])    Acute respiratory failure with hypoxia ([place]) 12/02/2019   Acute exacerbation of CHF (congestive heart failure) ([place]) 12/01/2019   Cellulitis in diabetic foot ([place]) 07/08/2019   Osteomyelitis ([place]) 06/21/2019   NICM (nonischemic cardiomyopathy) ([place]) 06/20/2019   Non-healing ulcer ([place]) 06/20/2019   Acute CHF (congestive heart failure) ([name]) 11/26/2018   CHF (congestive heart failure) ([place]) 11/26/2018   Acute respiratory failure ([place]) 10/21/2018   AKI (acute kidney injury) ([place]) 08/18/2018  Coagulation disorder ([place]) 08/09/2017   Depression 07/21/2017   At risk for adverse drug reaction 06/20/2017   Peripheral neuropathy 06/20/2017   Acute osteomyelitis of right foot ([place]) 06/13/2017   S/P transmetatarsal amputation of foot, right ([place]) 06/05/2017   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 ([place]) 05/19/2017   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[place])     SIRS (systemic inflammatory response syndrome) ([place]) 04/06/2017   CKD (chronic kidney disease), stage III ([place]) 11/24/2016   Suspected sleep apnea 11/24/2016   Ulcer of toe of right foot, with necrosis of bone ([place]) 10/27/2016   Ulcer of left lower leg ([place]) 05/19/2016   Severe obesity (BMI >= 40) ([name]) 02/24/2016   COPD GOLD 0  02/24/2016   Morbid obesity ([place]) 02/24/2016   Encounter for therapeutic drug monitoring 02/10/2016   Symptomatic bradycardia 01/12/2016   Hypertension associated with diabetes ([place]) 12/22/2015   Chronic combined systolic and diastolic CHF (congestive heart failure) ([place])    [place]    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 11/08/2015   Tobacco abuse 10/23/2015   Coronary artery disease    DOE (dyspnea on exertion) 04/29/2015   Diabetes mellitus type 2, uncontrolled 02/08/2015   Influenza A 02/07/2015   Wrist fracture, left, with routine healing, subsequent encounter 02/05/2015   Wrist fracture, left, closed, initial encounter 01/29/2015   PAF (paroxysmal atrial fibrillation) ([place]) 01/16/2015   Carotid arterial disease ([place]) 01/16/2015   Claudication ([place]) 01/15/2015   Demand ischemia ([place]) 10/29/2014   Insomnia 02/03/2014   COPD with acute exacerbation ([place]) 02/01/2014   S/P peripheral artery angioplasty - TurboHawk atherectomy; R SFA 09/11/2013    Class: Acute   Leg pain, bilateral 08/19/2013   Hypothyroidism 07/31/2013   Cellulitis 06/13/2013   History of cocaine abuse ([place]) 06/13/2013   Long term current use of anticoagulant therapy 05/20/2013   Alcohol abuse    Narcotic abuse ([place])    Marijuana abuse    Alcoholic cirrhosis ([place])    DM (diabetes mellitus), type 2 with peripheral vascular complications ([name])     Current Outpatient Medications:    ACCU-CHEK GUIDE test strip, USE TO CHECK BLOOD SUGAR FOUR TIMES DAILY, Disp: , Rfl:    acetaminophen (TYLENOL) 500 MG tablet, Take 500 mg by mouth every 6 (six) hours as needed for moderate pain., Disp: , Rfl:     albuterol (VENTOLIN HFA) 108 (90 Base) MCG/ACT inhaler, Inhale 2 puffs into the lungs every 6 (six) hours as needed for wheezing or shortness of breath., Disp: 18 g, Rfl: 3   allopurinol (ZYLOPRIM) 100 MG tablet, Take 1 tablet (100 mg total) by mouth daily., Disp: 30 tablet, Rfl: 6   amLODipine (NORVASC) 10 MG tablet, Take 1 tablet (10 mg total) by mouth daily., Disp: 90 tablet, Rfl: 3   apixaban (ELIQUIS) 5 MG TABS tablet, Take 5 mg by mouth 2 (two) times daily., Disp: , Rfl:    atorvastatin (LIPITOR) 40 MG tablet, Take 1 tablet (40 mg total) by mouth every evening., Disp: 90 tablet, Rfl: 1   budesonide-formoterol (SYMBICORT) 160-4.5 MCG/ACT inhaler, Inhale 2 puffs into the lungs 2 (two) times daily., Disp: 1 Inhaler, Rfl: 2   buPROPion (WELLBUTRIN SR) 150 MG 12 hr tablet, Take 1 tablet by mouth daily for 3 days then increase to 1 tablet by mouth 2 times daily., Disp: 60 tablet, Rfl: 2   clopidogrel (PLAVIX) 75 MG tablet, Take 1 tablet (75 mg total) by mouth daily., Disp: 30 tablet, Rfl: 11  colchicine 0.6 MG tablet, Take 2 tabs (1.2 mg) at the onset of a gout flare, may take 1 tab (0.6 mg) in 1 hour if symptoms, Disp: 30 tablet, Rfl: 3   Dulaglutide (TRULICITY) 1.5 SN/0.5LZ SOPN, Inject 1.5 mg into the skin once a week., Disp: 2 mL, Rfl: 6   empagliflozin (JARDIANCE) 10 MG TABS tablet, Take 1 tablet (10 mg total) by mouth daily before breakfast., Disp: 90 tablet, Rfl: 3   ferrous sulfate 324 MG TBEC, Take 324 mg by mouth., Disp: , Rfl:    FLUoxetine (PROZAC) 20 MG capsule, Take 1 capsule (20 mg total) by mouth at bedtime., Disp: 90 capsule, Rfl: 1   folic acid (FOLVITE) 1 MG tablet, Take 1 tablet (1 mg total) by mouth daily., Disp: 90 tablet, Rfl: 1   hydrALAZINE (APRESOLINE) 50 MG tablet, Take 1 tablet (50 mg total) by mouth 3 (three) times daily., Disp: 90 tablet, Rfl: 6   insulin glargine (LANTUS) 100 UNIT/ML Solostar Pen, Inject 30 Units into the skin daily., Disp: 30 mL, Rfl: 6   insulin  lispro (HUMALOG) 100 UNIT/ML KwikPen, Inject 8 Units into the skin 3 (three) times daily with meals., Disp: 15 mL, Rfl: 0   Insulin Pen Needle 32G X 4 MM MISC, USE AS DIRECTED WITH INSULIN PENS, Disp: 100 each, Rfl: 0   levothyroxine (SYNTHROID) 50 MCG tablet, Take 1 tablet (50 mcg total) by mouth daily., Disp: 90 tablet, Rfl: 1   losartan (COZAAR) 25 MG tablet, Take 2 tablets (50 mg total) by mouth daily. (Patient taking differently: Take 50 mg by mouth at bedtime.), Disp: 180 tablet, Rfl: 1   Omega-3 Fatty Acids (FISH OIL PO), Take 1 capsule by mouth daily., Disp: , Rfl:    ondansetron (ZOFRAN) 4 MG tablet, Take 1 tablet (4 mg total) by mouth every 8 (eight) hours as needed for nausea or vomiting., Disp: 20 tablet, Rfl: 0   pantoprazole (PROTONIX) 40 MG tablet, Take 1 tablet (40 mg total) by mouth daily., Disp: 90 tablet, Rfl: 0   potassium chloride SA (KLOR-CON M20) 20 MEQ tablet, Take 1 tablet (20 mEq total) by mouth daily., Disp: 90 tablet, Rfl: 3   torsemide (DEMADEX) 20 MG tablet, Take 3 tablets (60 mg total) by mouth 2 (two) times daily., Disp: 240 tablet, Rfl: 11   VITAMIN D PO, Take 1 tablet by mouth daily., Disp: , Rfl:    amiodarone (PACERONE) 200 MG tablet, TAKE 1/2 TABLET (100 MG TOTAL) BY MOUTH DAILY., Disp: 15 tablet, Rfl: 3   benzonatate (TESSALON) 100 MG capsule, Take 1 capsule (100 mg total) by mouth 2 (two) times daily as needed for cough. (Patient not taking: Reported on 12/09/2021), Disp: 40 capsule, Rfl: 0   Continuous Blood Gluc Sensor (FREESTYLE LIBRE 2 SENSOR) MISC, USE 1 (ONE) EACH EVERY 2 WEEKS (Patient not taking: Reported on 12/03/2021), Disp: 2 each, Rfl: 11   HYDROcodone-acetaminophen (NORCO/VICODIN) 5-325 MG tablet, Take 1 tablet by mouth every 4 (four) hours as needed. (Patient not taking: Reported on 12/09/2021), Disp: 10 tablet, Rfl: 0   Misc. Devices MISC, 2 L oxygen continuously via nasal cannula.  Diagnosis chronic respiratory failure with hypoxia (Patient not  taking: Reported on 12/03/2021), Disp: 1 each, Rfl: 0 Allergies  Allergen Reactions   Gabapentin Nausea And Vomiting and Other (See Comments)    POSSIBLE SHAKING   Lyrica [Pregabalin] Other (See Comments)    Shaking       Social History   Socioeconomic History  Marital status: Single    Spouse name: Not on file   Number of children: 1   Years of education: 72   Highest education level: 12th grade  Occupational History   Occupation: disabled  Tobacco Use   Smoking status: Every Day    Packs/day: 1.00    Years: 44.00    Pack years: 44.00    Types: E-cigarettes, Cigarettes   Smokeless tobacco: Former    Types: Snuff  Vaping Use   Vaping Use: Former   Devices: 11/26/2018 "stopped months ago"  Substance and Sexual Activity   Alcohol use: Yes    Comment: occ   Drug use: Yes    Types: "Crack" cocaine, Marijuana, Oxycodone   Sexual activity: Not Currently  Other Topics Concern   Not on file  Social History Narrative   ** Merged History Encounter **       Lives in [place] in motel with sister.  They are looking to move but don't have a place to go yet.     Social Determinants of Health   Financial Resource Strain: Not on file  Food Insecurity: Not on file  Transportation Needs: Not on file  Physical Activity: Not on file  Stress: Not on file  Social Connections: Not on file  Intimate Partner Violence: Not on file    Physical Exam      Future Appointments  Date Time Provider [place]  12/22/2021  1:00 PM [facility] VASC 3 [facility]  01/11/2022  3:30 PM [name], MD [facility]  01/31/2022  1:30 PM [facility] PA/NP [facility] None  04/04/2022  2:00 PM Larey Dresser, MD [place] None       Marylouise Stacks, [facility] Paramedic  12/09/21

## 2021-12-09 NOTE — Telephone Encounter (Signed)
Requested medications are due for refill today.  yes  Requested medications are on the active medications list.  yes  Last refill. 08/11/2021  Future visit scheduled.   no  Notes to clinic.  Failed protocol d/t abnormal labs.    Requested Prescriptions  Pending Prescriptions Disp Refills   colchicine 0.6 MG tablet [Pharmacy Med Name: COLCHICINE 0.6 MG ORAL TABLET] 30 tablet 3    Sig: Take 2 tabs (1.2 mg) at the onset of a gout flare, may take 1 tab (0.6 mg) in 1 hour if symptoms     Endocrinology:  Gout Agents Failed - 12/09/2021  3:19 PM      Failed - Uric Acid in normal range and within 360 days    Uric Acid  Date Value Ref Range Status  08/11/2021 10.3 (H) 3.0 - 7.2 mg/dL Final    Comment:               Therapeutic target for gout patients: <6.0          Failed - Cr in normal range and within 360 days    Creat  Date Value Ref Range Status  01/25/2016 1.09 (H) 0.50 - 1.05 mg/dL Final   Creatinine, Ser  Date Value Ref Range Status  12/03/2021 1.93 (H) 0.44 - 1.00 mg/dL Final   Creatinine, Urine  Date Value Ref Range Status  08/18/2018 72.60 mg/dL Final    Comment:    Performed at [facility], [place] [address], [place], [place] [address]          Passed - Valid encounter within last 12 months    Recent Outpatient Visits           3 weeks ago DM (diabetes mellitus), type 2 with peripheral vascular complications [facility])   [place], [place], MD   2 months ago DM (diabetes mellitus), type 2 with peripheral vascular complications [facility])   [place], [place], [place]   4 months ago DM (diabetes mellitus), type 2 with peripheral vascular complications [facility])   [place], Charlane Ferretti, MD   4 months ago DM (diabetes mellitus), type 2 with peripheral vascular complications [facility])   [place], Charlane Ferretti, MD   6 years ago Type  2 diabetes mellitus with hyperglycemia   [place], [name], MD       Future Appointments             In 1 month Gwenlyn Found, [name], MD [place] [facility]

## 2021-12-10 ENCOUNTER — Other Ambulatory Visit (HOSPITAL_COMMUNITY): Payer: Self-pay | Admitting: *Deleted

## 2021-12-10 ENCOUNTER — Other Ambulatory Visit (HOSPITAL_COMMUNITY): Payer: Self-pay

## 2021-12-10 MED ORDER — APIXABAN 5 MG PO TABS: 5.0000 mg | ORAL_TABLET | Freq: Two times a day (BID) | ORAL | 8 refills | Status: DC

## 2021-12-17 ENCOUNTER — Emergency Department (HOSPITAL_COMMUNITY)
Admission: EM | Admit: 2021-12-17 | Discharge: 2021-12-18 | Disposition: A | Discharge status: Home / Self Care | Payer: Medicare Other | Attending: Emergency Medicine | Admitting: Emergency Medicine

## 2021-12-17 ENCOUNTER — Other Ambulatory Visit: Payer: Self-pay

## 2021-12-17 ENCOUNTER — Encounter (HOSPITAL_COMMUNITY): Payer: Self-pay | Admitting: Emergency Medicine

## 2021-12-17 ENCOUNTER — Emergency Department (HOSPITAL_COMMUNITY): Payer: Medicare Other

## 2021-12-17 DIAGNOSIS — M2578 Osteophyte, vertebrae: Secondary | ICD-10-CM | POA: Diagnosis not present

## 2021-12-17 DIAGNOSIS — I5042 Chronic combined systolic (congestive) and diastolic (congestive) heart failure: Secondary | ICD-10-CM | POA: Diagnosis not present

## 2021-12-17 DIAGNOSIS — S81811A Laceration without foreign body, right lower leg, initial encounter: Secondary | ICD-10-CM | POA: Diagnosis not present

## 2021-12-17 DIAGNOSIS — W108XXA Fall (on) (from) other stairs and steps, initial encounter: Secondary | ICD-10-CM | POA: Insufficient documentation

## 2021-12-17 DIAGNOSIS — R58 Hemorrhage, not elsewhere classified: Secondary | ICD-10-CM | POA: Diagnosis not present

## 2021-12-17 DIAGNOSIS — I480 Paroxysmal atrial fibrillation: Secondary | ICD-10-CM | POA: Diagnosis not present

## 2021-12-17 DIAGNOSIS — Z7951 Long term (current) use of inhaled steroids: Secondary | ICD-10-CM | POA: Insufficient documentation

## 2021-12-17 DIAGNOSIS — M545 Low back pain, unspecified: Secondary | ICD-10-CM | POA: Diagnosis not present

## 2021-12-17 DIAGNOSIS — S3992XA Unspecified injury of lower back, initial encounter: Secondary | ICD-10-CM | POA: Insufficient documentation

## 2021-12-17 DIAGNOSIS — E1122 Type 2 diabetes mellitus with diabetic chronic kidney disease: Secondary | ICD-10-CM | POA: Diagnosis not present

## 2021-12-17 DIAGNOSIS — S51811A Laceration without foreign body of right forearm, initial encounter: Secondary | ICD-10-CM | POA: Diagnosis not present

## 2021-12-17 DIAGNOSIS — Z79899 Other long term (current) drug therapy: Secondary | ICD-10-CM | POA: Diagnosis not present

## 2021-12-17 DIAGNOSIS — W19XXXA Unspecified fall, initial encounter: Secondary | ICD-10-CM

## 2021-12-17 DIAGNOSIS — I130 Hypertensive heart and chronic kidney disease with heart failure and stage 1 through stage 4 chronic kidney disease, or unspecified chronic kidney disease: Secondary | ICD-10-CM | POA: Diagnosis not present

## 2021-12-17 DIAGNOSIS — J449 Chronic obstructive pulmonary disease, unspecified: Secondary | ICD-10-CM | POA: Insufficient documentation

## 2021-12-17 DIAGNOSIS — E119 Type 2 diabetes mellitus without complications: Secondary | ICD-10-CM | POA: Insufficient documentation

## 2021-12-17 DIAGNOSIS — N183 Chronic kidney disease, stage 3 unspecified: Secondary | ICD-10-CM | POA: Diagnosis not present

## 2021-12-17 DIAGNOSIS — I2510 Atherosclerotic heart disease of native coronary artery without angina pectoris: Secondary | ICD-10-CM | POA: Insufficient documentation

## 2021-12-17 DIAGNOSIS — M25551 Pain in right hip: Secondary | ICD-10-CM | POA: Diagnosis not present

## 2021-12-17 DIAGNOSIS — E039 Hypothyroidism, unspecified: Secondary | ICD-10-CM | POA: Insufficient documentation

## 2021-12-17 DIAGNOSIS — M25559 Pain in unspecified hip: Secondary | ICD-10-CM

## 2021-12-17 DIAGNOSIS — Z7901 Long term (current) use of anticoagulants: Secondary | ICD-10-CM | POA: Insufficient documentation

## 2021-12-17 DIAGNOSIS — Z743 Need for continuous supervision: Secondary | ICD-10-CM | POA: Diagnosis not present

## 2021-12-17 DIAGNOSIS — S75001A Unspecified injury of femoral artery, right leg, initial encounter: Secondary | ICD-10-CM | POA: Diagnosis present

## 2021-12-17 DIAGNOSIS — Z794 Long term (current) use of insulin: Secondary | ICD-10-CM | POA: Diagnosis not present

## 2021-12-17 DIAGNOSIS — R739 Hyperglycemia, unspecified: Secondary | ICD-10-CM | POA: Diagnosis not present

## 2021-12-17 DIAGNOSIS — Y92009 Unspecified place in unspecified non-institutional (private) residence as the place of occurrence of the external cause: Secondary | ICD-10-CM | POA: Diagnosis not present

## 2021-12-17 MED ORDER — ACETAMINOPHEN 325 MG PO TABS
650.0000 mg | ORAL_TABLET | Freq: Once | ORAL | Status: AC
Start: 2021-12-17 — End: 2021-12-17
  Administered 2021-12-17: 650 mg via ORAL
  Filled 2021-12-17: qty 2

## 2021-12-17 NOTE — ED Triage Notes (Signed)
Patient arrived with EMS from home tripped fell at stairs ( 4 steps) this evening , no LOC , presents with skin laceration at right shin and skin tear at left hand . Patient added right hip and bilateral knees pain . She takes Eliquis .

## 2021-12-17 NOTE — ED Provider Triage Note (Addendum)
Emergency Medicine Provider Triage Evaluation Note  [name] , a 61 y.o. female  was evaluated in triage.  Pt complains of mechanical fall down 4 wood steps at home. Denies head trauma and LOC. C/o pain to right low back and R hip as well as wound to the RLE. Chronically anticoagulated on Eliquis. Denies lightheadedness, dizziness, CP, SOB. Patient reports UTD tetanus.   Review of Systems  Positive: As above Negative: As above  Physical Exam  LMP  (LMP Unknown)  Gen:   Awake, no distress   Resp:  Normal effort  MSK:   Moves extremities without difficulty  Other:  Skin tears to dorsum of L hand. Laceration to the anterior mid shin of RLE. Bleeding controlled. No hematoma or contusion to low back or flank. No leg shortening or malrotation. Head is atraumatic.  Medical Decision Making  Medically screening exam initiated at 10:22 PM.  Appropriate orders placed.  [name] was informed that the remainder of the evaluation will be completed by another provider, this initial triage assessment does not replace that evaluation, and the importance of remaining in the ED until their evaluation is complete.  Laceration s/p mechanical fall with R hip pain - imaging pending   Antonietta Breach, PA-C 12/17/21 2228    Antonietta Breach, PA-C 12/17/21 2229

## 2021-12-18 MED ORDER — ACETAMINOPHEN 325 MG PO TABS
650.0000 mg | ORAL_TABLET | Freq: Once | ORAL | Status: AC
  Administered 2021-12-18: 650 mg via ORAL
  Filled 2021-12-18: qty 2

## 2021-12-18 NOTE — ED Notes (Signed)
ED Provider at bedside. 

## 2021-12-18 NOTE — ED Notes (Signed)
Telfa dressing applied over steri-strips. No acute changes noted. Will continue to monitor.

## 2021-12-18 NOTE — ED Notes (Addendum)
Dermaclips unavailable per charge RN, right leg wound irrigated with NS flush and eye shield. No acute changes noted. Pt resting on stretcher with eyes closed, respirations even and unlabored. MD notified about Dermaclips not being available. Will continue to monitor.

## 2021-12-18 NOTE — ED Provider Notes (Signed)
[facility] EMERGENCY DEPARTMENT Provider Note  CSN: [id number] Arrival date & time: 12/17/21 2220  Chief Complaint(s) Fall  HPI [name] is a 61 y.o. female here with PAF on eliquis, DM, CHF here for mechanical fall down 4 wood steps at home 10-12 hrs. Denies head trauma and LOC. C/o pain to right low back and R hip as well as wound to the RLE.  The history is provided by the patient.   Past Medical History Past Medical History:  Diagnosis Date   Alcohol abuse    Alcoholic cirrhosis ([place])    Anemia    Anxiety    Arthritis    "knees" (11/26/2018)   B12 deficiency    CAD (coronary artery disease)    a. 11/10/2014 Cath: LM nl, LAD min irregs, D1 30 ost, D2 50d, LCX 53m, OM1 80 p/m (1.5 mm vessel), OM2 63m, RCA nondom 76m-->med rx.. Demand ischemia in the setting of rapid a-fib.   Cardiomyopathy ([place])    Carotid artery disease ([place])    a. 01/2015 Carotid Angio: RICA 379, LICA 02I; b. 0/9735 s/p L CEA; c. 05/2019 Carotid U/S: RICA 100. RECA >50. LICA 3-29%.   Cellulitis    lower extremities   CHF (congestive heart failure) ([name])    Chronic combined systolic and diastolic CHF (congestive heart failure) ([place])    a. 10/2014 Echo: EF 40-45%; b. 10/2018 Echo: EF 45-50%, gr2 DD; c. 11/2019 Echo: EF 50%, mild LVH, gr2 DD (restrictive), antlat HK, Nl RV fxn. Mild BAE. RVSP 59.106mmHg.   CKD (chronic kidney disease), stage III ([name])    Cocaine abuse ([name])    COPD (chronic obstructive pulmonary disease) ([name])    Critical lower limb ischemia ([name])    Depression    Diabetes mellitus without complication ([name])    Diabetic peripheral neuropathy ([name])    DVT (deep venous thrombosis) ([name])    Dyspnea    Elevated troponin    a. Chronic elevation.   GERD (gastroesophageal reflux disease)    Hyperlipemia    Hypertension    Hypokalemia    Hypomagnesemia    Hypothyroidism    Marijuana abuse    Narcotic abuse ([place])    Noncompliance    NSVT (nonsustained ventricular  tachycardia)    Obesity    PAF (paroxysmal atrial fibrillation) ([name])    Paroxysmal atrial tachycardia ([name])    Peripheral arterial disease ([place])    a. 01/2015 Angio/PTA: RSFA 99 (atherectomy/pta) - 1 vessel runoff via diff dzs peroneal; b. 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   Pneumonia    "once or twice" (11/26/2018)   Poorly controlled type 2 diabetes mellitus ([place])    Renal disorder    Renal insufficiency    a. 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   Sleep apnea    "couldn't handle wearing the mask" (11/26/2018)   Symptomatic bradycardia    a. Avoid AV blocking agent per EP. Prev req temp wire in 2017.   Tobacco abuse    Patient Active Problem List   Diagnosis Date Noted   Hyperlipidemia associated with type 2 diabetes mellitus ([place]) 07/29/2021   (HFpEF) heart failure with preserved ejection fraction ([place]) 07/29/2021   Bradycardia 06/13/2021   Uncontrolled type 2 diabetes mellitus with hyperglycemia ([place]) 06/13/2021   Acute kidney injury superimposed on CKD ([place]) 06/13/2021   Critical ischemia of foot ([place]) 06/07/2021   Fall 05/05/2021   PAF (paroxysmal atrial fibrillation) ([place]) 05/05/2021  COPD (chronic obstructive pulmonary disease) ([place]) 05/05/2021   DM (diabetes mellitus), type 2 with complications ([place]) 54/65/0354   PAD (peripheral artery disease) ([place]) 05/05/2021   Cellulitis 05/05/2021   Acute on chronic combined systolic and diastolic CHF (congestive heart failure) ([place]) 05/05/2021   OSA (obstructive sleep apnea) 05/05/2021   CKD (chronic kidney disease) stage 3, GFR 30-59 ml/min ([place]) 05/05/2021   AKI (acute kidney injury) ([place]) 05/04/2021   Pressure injury of skin 05/04/2021   Ulcer of foot, unspecified laterality, limited to breakdown of skin ([place]) 02/17/2021   Altered mental status 01/02/2021   AF (paroxysmal atrial fibrillation) ([place]) 01/02/2021   CAD (coronary artery disease) 01/02/2021   PVD (peripheral vascular disease) ([place]) 01/02/2021   Acute  renal failure superimposed on stage 4 chronic kidney disease ([place]) 01/02/2021   Diabetes mellitus type 2, uncontrolled, with complications 65/68/1275   Diabetic nephropathy ([place]) 01/02/2021   Low back pain 06/01/2020   Hyperlipidemia 04/03/2020   Muscle weakness 03/20/2020   Closed fracture of neck of right radius 03/13/2020   Pain in elbow 03/12/2020   PAD (peripheral artery disease) ([place]) 12/26/2019   Acute renal failure ([name])    Acute respiratory failure with hypoxia ([place]) 12/02/2019   Acute exacerbation of CHF (congestive heart failure) ([place]) 12/01/2019   Cellulitis in diabetic foot ([place]) 07/08/2019   Osteomyelitis ([place]) 06/21/2019   NICM (nonischemic cardiomyopathy) ([place]) 06/20/2019   Non-healing ulcer ([place]) 06/20/2019   Acute CHF (congestive heart failure) ([place]) 11/26/2018   CHF (congestive heart failure) ([place]) 11/26/2018   Acute respiratory failure ([place]) 10/21/2018   AKI (acute kidney injury) ([place]) 08/18/2018   Coagulation disorder ([place]) 08/09/2017   Depression 07/21/2017   At risk for adverse drug reaction 06/20/2017   Peripheral neuropathy 06/20/2017   Acute osteomyelitis of right foot ([place]) 06/13/2017   S/P transmetatarsal amputation of foot, right ([place]) 06/05/2017   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 ([place]) 05/19/2017   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[place])    SIRS (systemic inflammatory response syndrome) ([place]) 04/06/2017   CKD (chronic kidney disease), stage III ([place]) 11/24/2016   Suspected sleep apnea 11/24/2016   Ulcer of toe of right foot, with necrosis of bone ([place]) 10/27/2016   Ulcer of left lower leg ([place]) 05/19/2016   Severe obesity (BMI >= 40) ([place]) 02/24/2016   COPD GOLD 0  02/24/2016   Morbid obesity ([place]) 02/24/2016   Encounter for therapeutic drug monitoring 02/10/2016   Symptomatic bradycardia 01/12/2016   Hypertension associated with diabetes ([place]) 12/22/2015   Chronic combined systolic and  diastolic CHF (congestive heart failure) ([name])    [place]    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 11/08/2015   Tobacco abuse 10/23/2015   Coronary artery disease    DOE (dyspnea on exertion) 04/29/2015   Diabetes mellitus type 2, uncontrolled 02/08/2015   Influenza A 02/07/2015   Wrist fracture, left, with routine healing, subsequent encounter 02/05/2015   Wrist fracture, left, closed, initial encounter 01/29/2015   PAF (paroxysmal atrial fibrillation) ([place]) 01/16/2015   Carotid arterial disease ([place]) 01/16/2015   Claudication ([place]) 01/15/2015   Demand ischemia ([place]) 10/29/2014   Insomnia 02/03/2014   COPD with acute exacerbation ([place]) 02/01/2014   S/P peripheral artery angioplasty - [place]boHawk atherectomy; R SFA 09/11/2013    Class: Acute   Leg pain, bilateral 08/19/2013   Hypothyroidism 07/31/2013   Cellulitis 06/13/2013   History of cocaine abuse ([place]) 06/13/2013   Long term current use of anticoagulant therapy 05/20/2013  Alcohol abuse    Narcotic abuse ([place])    Marijuana abuse    Alcoholic cirrhosis ([place])    DM (diabetes mellitus), type 2 with peripheral vascular complications ([place])    Home Medication(s) Prior to Admission medications   Medication Sig Start Date End Date Taking? Authorizing Provider  ACCU-CHEK GUIDE test strip USE TO CHECK BLOOD SUGAR FOUR TIMES DAILY 02/25/20   [provider]  acetaminophen (TYLENOL) 500 MG tablet Take 500 mg by mouth every 6 (six) hours as needed for moderate pain.    [provider]  albuterol (VENTOLIN HFA) 108 (90 Base) MCG/ACT inhaler Inhale 2 puffs into the lungs every 6 (six) hours as needed for wheezing or shortness of breath. 07/30/21   Danford, Suann Larry, MD  allopurinol (ZYLOPRIM) 100 MG tablet Take 1 tablet (100 mg total) by mouth daily. 08/12/21   Charlott Rakes, MD  amiodarone (PACERONE) 200 MG tablet TAKE 1/2 TABLET (100 MG TOTAL) BY MOUTH DAILY. 12/09/21   Larey Dresser, MD  amLODipine (NORVASC) 10 MG tablet Take 1  tablet (10 mg total) by mouth daily. 10/21/21   Larey Dresser, MD  apixaban (ELIQUIS) 5 MG TABS tablet Take 1 tablet (5 mg total) by mouth 2 (two) times daily. 12/10/21   Larey Dresser, MD  atorvastatin (LIPITOR) 40 MG tablet Take 1 tablet (40 mg total) by mouth every evening. 10/14/21   Charlott Rakes, MD  benzonatate (TESSALON) 100 MG capsule Take 1 capsule (100 mg total) by mouth 2 (two) times daily as needed for cough. Patient not taking: Reported on 12/09/2021 09/23/21   Argentina Donovan, PA-C  budesonide-formoterol [facility]) 160-4.5 MCG/ACT inhaler Inhale 2 puffs into the lungs 2 (two) times daily. 12/09/19   Eugenie Filler, MD  buPROPion (WELLBUTRIN SR) 150 MG 12 hr tablet Take 1 tablet by mouth daily for 3 days then increase to 1 tablet by mouth 2 times daily. 10/21/21   Larey Dresser, MD  clopidogrel (PLAVIX) 75 MG tablet Take 1 tablet (75 mg total) by mouth daily. 10/05/21   Bensimhon, Shaune Pascal, MD  colchicine 0.6 MG tablet TAKE 2 TABS (1.2 MG) AT THE ONSET OF A GOUT FLARE, MAY TAKE 1 TAB (0.6 MG) IN 1 HOUR IF SYMPTOMS 12/10/21   Charlott Rakes, MD  Continuous Blood Gluc Sensor (FREESTYLE LIBRE 2 SENSOR) MISC USE 1 (ONE) EACH EVERY 2 WEEKS Patient not taking: Reported on 12/03/2021 07/14/21   Charlott Rakes, MD  Dulaglutide (TRULICITY) 1.5 MB/5.5HR SOPN Inject 1.5 mg into the skin once a week. 08/11/21   Charlott Rakes, MD  empagliflozin (JARDIANCE) 10 MG TABS tablet Take 1 tablet (10 mg total) by mouth daily before breakfast. 04/01/21   Clegg, [name] D, NP  ferrous sulfate 324 MG TBEC Take 324 mg by mouth.    [provider]  FLUoxetine (PROZAC) 20 MG capsule Take 1 capsule (20 mg total) by mouth at bedtime. 07/14/21   Charlott Rakes, MD  folic acid (FOLVITE) 1 MG tablet Take 1 tablet (1 mg total) by mouth daily. 07/14/21   Charlott Rakes, MD  hydrALAZINE (APRESOLINE) 50 MG tablet Take 1 tablet (50 mg total) by mouth 3 (three) times daily. 08/11/21   Charlott Rakes, MD   HYDROcodone-acetaminophen (NORCO/VICODIN) 5-325 MG tablet Take 1 tablet by mouth every 4 (four) hours as needed. Patient not taking: Reported on 12/09/2021 10/19/21   Isla Pence, MD  insulin glargine (LANTUS) 100 UNIT/ML Solostar Pen Inject 30 Units into the skin daily. 07/14/21  07/14/22  Charlott Rakes, MD  insulin lispro (HUMALOG) 100 UNIT/ML KwikPen Inject 8 Units into the skin 3 (three) times daily with meals. 01/05/21   Allie Bossier, MD  Insulin Pen Needle 32G X 4 MM MISC USE AS DIRECTED WITH INSULIN PENS 01/05/21 01/05/22  Allie Bossier, MD  levothyroxine (SYNTHROID) 50 MCG tablet Take 1 tablet (50 mcg total) by mouth daily. 10/11/21   Argentina Donovan, PA-C  losartan (COZAAR) 25 MG tablet Take 2 tablets (50 mg total) by mouth daily. Patient taking differently: Take 50 mg by mouth at bedtime. 08/16/21 12/03/22  Rafael Bihari, FNP  Misc. Devices MISC 2 L oxygen continuously via nasal cannula.  Diagnosis chronic respiratory failure with hypoxia Patient not taking: Reported on 12/03/2021 08/19/21   Charlott Rakes, MD  Omega-3 Fatty Acids (FISH OIL PO) Take 1 capsule by mouth daily.    [provider]  ondansetron (ZOFRAN) 4 MG tablet Take 1 tablet (4 mg total) by mouth every 8 (eight) hours as needed for nausea or vomiting. 08/11/21   Charlott Rakes, MD  pantoprazole (PROTONIX) 40 MG tablet Take 1 tablet (40 mg total) by mouth daily. 10/20/21   Charlott Rakes, MD  potassium chloride SA (KLOR-CON M20) 20 MEQ tablet Take 1 tablet (20 mEq total) by mouth daily. 10/21/21 01/19/22  Larey Dresser, MD  torsemide (DEMADEX) 20 MG tablet Take 3 tablets (60 mg total) by mouth 2 (two) times daily. 11/11/21   Larey Dresser, MD  VITAMIN D PO Take 1 tablet by mouth daily.    [provider]  tiotropium (SPIRIVA HANDIHALER) 18 MCG inhalation capsule Place 1 capsule (18 mcg total) into inhaler and inhale every morning. Patient not taking: Reported on 02/09/2021 12/09/19 02/09/21   Eugenie Filler, MD                                                                                                                                    Allergies Gabapentin and Lyrica [pregabalin]  Review of Systems Review of Systems As noted in HPI  Physical Exam Vital Signs  I have reviewed the triage vital signs BP 136/78    Pulse 84    Temp 98.5 F (36.9 C) (Oral)    Resp 16    Ht 5\' 4"  (1.626 m)    Wt 109 kg    LMP  (LMP Unknown)    SpO2 96%    BMI 41.25 kg/m   Physical Exam Constitutional:      General: She is not in acute distress.    Appearance: She is well-developed. She is not diaphoretic.  HENT:     Head: Normocephalic and atraumatic.     Right Ear: External ear normal.     Left Ear: External ear normal.     Nose: Nose normal.  Eyes:     General: No scleral icterus.       Right  eye: No discharge.        Left eye: No discharge.     Conjunctiva/sclera: Conjunctivae normal.     Pupils: Pupils are equal, round, and reactive to light.  Cardiovascular:     Rate and Rhythm: Normal rate and regular rhythm.     Pulses:          Radial pulses are 2+ on the right side and 2+ on the left side.       Dorsalis pedis pulses are 2+ on the right side and 2+ on the left side.     Heart sounds: Normal heart sounds. No murmur heard.   No friction rub. No gallop.  Pulmonary:     Effort: Pulmonary effort is normal. No respiratory distress.     Breath sounds: Normal breath sounds. No stridor. No wheezing.  Abdominal:     General: There is no distension.     Palpations: Abdomen is soft.     Tenderness: There is no abdominal tenderness.  Musculoskeletal:       Arms:     Cervical back: Normal range of motion and neck supple. No bony tenderness.     Thoracic back: No bony tenderness.     Lumbar back: Tenderness present. No bony tenderness.     Right hip: Tenderness present. No deformity or crepitus. Normal range of motion.     Left hip: No deformity, tenderness or crepitus.  Normal range of motion.       Legs:     Comments: Clavicles stable. Chest stable to AP/Lat compression. Pelvis stable to Lat compression. No obvious extremity deformity. No chest or abdominal wall contusion.  Skin:    General: Skin is warm and dry.     Findings: No erythema or rash.  Neurological:     Mental Status: She is alert and oriented to person, place, and time.     Comments: Moving all extremities    ED Results and Treatments Labs (all labs ordered are listed, but only abnormal results are displayed) Labs Reviewed - No data to display                                                                                                                       EKG  EKG Interpretation  Date/Time:    Ventricular Rate:    PR Interval:    QRS Duration:   QT Interval:    QTC Calculation:   R Axis:     Text Interpretation:         Radiology DG Lumbar Spine Complete  Result Date: 12/17/2021 CLINICAL DATA:  Patient fell today.  Low back and right hip pain. EXAM: LUMBAR SPINE - COMPLETE 4+ VIEW COMPARISON:  None. FINDINGS: Five lumbar type vertebral bodies with sacralized L5. Normal alignment. No vertebral compression deformities. No focal bone lesion or bone destruction. Mild degenerative changes with narrowed interspaces and endplate osteophyte formation most prominent in the upper lumbar region and at the lumbosacral interspace. Degenerative changes in  the facet joints. Visualized sacrum appears intact. Prominent aortic vascular calcification. IMPRESSION: Normal alignment. No acute displaced fractures identified. No focal bone lesions. Mild degenerative changes. Electronically Signed   By: Lucienne Capers M.D.   On: 12/17/2021 23:22   DG Hip Unilat W or Wo Pelvis 2-3 Views Right  Result Date: 12/17/2021 CLINICAL DATA:  Right hip pain after a fall. EXAM: DG HIP (WITH OR WITHOUT PELVIS) 2-3V RIGHT COMPARISON:  None. FINDINGS: Mild valgus orientation to both hips with mild deformity of  the femoral heads possibly related to previous slipped capital femoral epiphyses. Degenerative changes in the hip joints. Right hip demonstrates no evidence of acute fracture or dislocation. No focal bone lesion or bone destruction. Visualized pelvis appears intact. Prominent vascular calcifications. IMPRESSION: Chronic valgus orientation of the hips with mild degenerative changes. No acute displaced fractures identified. Electronically Signed   By: Lucienne Capers M.D.   On: 12/17/2021 23:24    Pertinent labs & imaging results that were available during my care of the patient were reviewed by me and considered in my medical decision making (see MDM for details).  Medications Ordered in ED Medications  acetaminophen (TYLENOL) tablet 650 mg (650 mg Oral Given 12/17/21 2237)  acetaminophen (TYLENOL) tablet 650 mg (650 mg Oral Given 12/18/21 0559)                                                                                                                                     Procedures .Marland KitchenLaceration Repair  Date/Time: 12/18/2021 6:34 AM Performed by: Fatima Blank, MD Authorized by: Fatima Blank, MD   Consent:    Consent obtained:  Verbal   Consent given by:  Patient   Alternatives discussed:  No treatment Universal protocol:    Patient identity confirmed:  Verbally with patient and arm band Anesthesia:    Anesthesia method:  None Laceration details:    Location:  Leg   Leg location:  R lower leg   Length (cm):  3   Depth (mm):  3 Pre-procedure details:    Preparation:  Patient was prepped and draped in usual sterile fashion Exploration:    Hemostasis achieved with:  Direct pressure   Wound extent: no foreign bodies/material noted, no muscle damage noted and no vascular damage noted   Treatment:    Area cleansed with:  Saline   Amount of cleaning:  Standard   Irrigation solution:  Sterile saline   Irrigation method:  Pressure wash   Debridement:  None Skin repair:     Repair method:  Steri-Strips   Number of Steri-Strips:  3 Approximation:    Approximation:  Loose Repair type:    Repair type:  Simple Post-procedure details:    Dressing:  Non-adherent dressing   Procedure completion:  Tolerated  (including critical care time)  Medical Decision Making / ED Course        Mechanical fall at home  Right LBP Right Hip pain RLE laceration, Right hand skin tear On chronic AC, but no head trauma or headache concerning ICH requiring CT at this time Plain films to assess for acute injury  Work-up ordered to assess concerns above. Imaging Independently interpreted by me and noted below: Negative lumbar and hip plain films   Management: Oral Tylenol Laceration thoroughly irrigated and closed as above   Reassessment: Pain improved No change in mental status concerning for delayed bleed    Final Clinical Impression(s) / ED Diagnoses Final diagnoses:  Fall in home, initial encounter  Injury of low back, initial encounter  Hip pain  Laceration of right lower extremity, initial encounter   The patient appears reasonably screened and/or stabilized for discharge and I doubt any other medical condition or other [facility] requiring further screening, evaluation, or treatment in the ED at this time prior to discharge. Safe for discharge with strict return precautions.  Disposition: Discharge  Condition: Good  I have discussed the results, Dx and Tx plan with the patient/family who expressed understanding and agree(s) with the plan. Discharge instructions discussed at length. The patient/family was given strict return precautions who verbalized understanding of the instructions. No further questions at time of discharge.    ED Discharge Orders     None       Follow Up: Charlott Rakes, MD [place] [address] [phone]  Call  as needed           This chart was dictated using voice recognition software.   Despite best efforts to proofread,  errors can occur which can change the documentation meaning.    Fatima Blank, MD 12/18/21 [phone]

## 2021-12-21 ENCOUNTER — Telehealth (HOSPITAL_COMMUNITY): Payer: Self-pay

## 2021-12-21 ENCOUNTER — Telehealth: Payer: Self-pay | Admitting: Family Medicine

## 2021-12-21 NOTE — Telephone Encounter (Signed)
Medication Refill - Medication: losartan (COZAAR) 25 MG tablet   Requested by Lelon Frohlich from [facility]   Has the patient contacted their pharmacy? Yes.   (Agent: If no, request that the patient contact the pharmacy for the refill. If patient does not wish to contact the pharmacy document the reason why and proceed with request.) (Agent: If yes, when and what did the pharmacy advise?)  Preferred Pharmacy (with phone number or street name):  [place], [place] - [address]  [place] [address]  Phone: [phone] Fax: [phone]   Has the patient been seen for an appointment in the last year OR does the patient have an upcoming appointment? Yes.    Agent: Please be advised that RX refills may take up to 3 business days. We ask that you follow-up with your pharmacy.

## 2021-12-21 NOTE — Telephone Encounter (Signed)
[place] to request the following for refills:  Eliquis Wellbutrin  Folic acid   Marylouise Stacks, EMT-Paramedic 12/21/21

## 2021-12-22 ENCOUNTER — Other Ambulatory Visit: Payer: Self-pay

## 2021-12-22 ENCOUNTER — Ambulatory Visit (HOSPITAL_COMMUNITY)
Admission: RE | Admit: 2021-12-22 | Discharge: 2021-12-22 | Disposition: A | Discharge status: Home / Self Care | Payer: Medicare Other | Source: Ambulatory Visit | Attending: Cardiovascular Disease | Admitting: Cardiovascular Disease

## 2021-12-22 DIAGNOSIS — L97519 Non-pressure chronic ulcer of other part of right foot with unspecified severity: Secondary | ICD-10-CM | POA: Insufficient documentation

## 2021-12-22 DIAGNOSIS — I739 Peripheral vascular disease, unspecified: Secondary | ICD-10-CM | POA: Diagnosis not present

## 2021-12-22 MED ORDER — LOSARTAN POTASSIUM 25 MG PO TABS: 50.0000 mg | ORAL_TABLET | Freq: Every day | ORAL | 0 refills | Status: DC

## 2021-12-22 NOTE — Telephone Encounter (Signed)
Rx sent 

## 2021-12-22 NOTE — Telephone Encounter (Signed)
Prescriber not at this practice. Requested Prescriptions  Pending Prescriptions Disp Refills   losartan (COZAAR) 25 MG tablet 180 tablet 1    Sig: Take 2 tablets (50 mg total) by mouth daily.     Cardiovascular:  Angiotensin Receptor Blockers Failed - 12/21/2021  4:11 PM      Failed - Cr in normal range and within 180 days    Creat  Date Value Ref Range Status  01/25/2016 1.09 (H) 0.50 - 1.05 mg/dL Final   Creatinine, Ser  Date Value Ref Range Status  12/03/2021 1.93 (H) 0.44 - 1.00 mg/dL Final   Creatinine, Urine  Date Value Ref Range Status  08/18/2018 72.60 mg/dL Final    Comment:    Performed at [facility], [place] [address], [place], [place] [address]         Passed - K in normal range and within 180 days    Potassium  Date Value Ref Range Status  12/03/2021 3.9 3.5 - 5.1 mmol/L Final         Passed - Patient is not pregnant      Passed - Last BP in normal range    BP Readings from Last 1 Encounters:  12/18/21 136/78         Passed - Valid encounter within last 6 months    Recent Outpatient Visits          1 month ago DM (diabetes mellitus), type 2 with peripheral vascular complications ([place])   [place], [place], MD   3 months ago DM (diabetes mellitus), type 2 with peripheral vascular complications [facility])   [place], [place], [place]   4 months ago DM (diabetes mellitus), type 2 with peripheral vascular complications [facility])   [place], [place], MD   5 months ago DM (diabetes mellitus), type 2 with peripheral vascular complications [facility])   [place], Charlane Ferretti, MD   6 years ago Type 2 diabetes mellitus with hyperglycemia   [place], [name], MD      Future Appointments            In 2 weeks Gwenlyn Found, [name], MD [place] [facility], [facility]

## 2021-12-22 NOTE — Addendum Note (Signed)
Addended by: Daisy Blossom, Annie Main L on: 12/22/2021 01:51 PM   Modules accepted: Orders

## 2021-12-23 ENCOUNTER — Other Ambulatory Visit (HOSPITAL_COMMUNITY): Payer: Self-pay | Admitting: Cardiology

## 2021-12-23 ENCOUNTER — Other Ambulatory Visit: Payer: Self-pay | Admitting: Family Medicine

## 2021-12-23 ENCOUNTER — Other Ambulatory Visit (HOSPITAL_COMMUNITY): Payer: Self-pay

## 2021-12-23 NOTE — Progress Notes (Signed)
Pt was out of town today to [facility] with her sister, she got back around 5pm-I met her at the house, but they did not have a key to get inside and nobody was home therefore could not get to the pills to fill pill boxes back up.  I had p/u a couple of her meds today, her sister will fill pill box and I will f/u with her next week.  I made her a list and left with her, pharmacy will also deliver the wellbutrin tomor.   Marylouise Stacks, EMT-Paramedic  12/23/21

## 2021-12-23 NOTE — Telephone Encounter (Signed)
Requested Prescriptions  Pending Prescriptions Disp Refills   folic acid (FOLVITE) 1 MG tablet [Pharmacy Med Name: FOLIC ACID 1 MG ORAL TABLET] 90 tablet 1    Sig: TAKE 1 TABLET (1 MG TOTAL) BY MOUTH DAILY.     Endocrinology:  Vitamins Passed - 12/23/2021 10:56 AM      Passed - Valid encounter within last 12 months    Recent Outpatient Visits          1 month ago DM (diabetes mellitus), type 2 with peripheral vascular complications ([place])   [place], [place], MD   3 months ago DM (diabetes mellitus), type 2 with peripheral vascular complications [facility])   [place], [place], [place]   4 months ago DM (diabetes mellitus), type 2 with peripheral vascular complications [facility])   [place], [place], MD   5 months ago DM (diabetes mellitus), type 2 with peripheral vascular complications [facility])   [place], Charlane Ferretti, MD   6 years ago Type 2 diabetes mellitus with hyperglycemia   [place], [name], MD      Future Appointments            In 2 weeks Gwenlyn Found, [name], MD [place], [facility]

## 2021-12-29 ENCOUNTER — Other Ambulatory Visit (HOSPITAL_COMMUNITY): Payer: Self-pay

## 2021-12-29 NOTE — Progress Notes (Signed)
Paramedicine Encounter    Patient ID: [name], female    DOB: 04/12/1961, 61 y.o.   MRN: [id number]  Pt reports she is doing ok, she had another fall over the wknd. She keeps tripping and the stairs make her trip as well.  She has not been doing cardiomems for past week or so, she is packing up to move again.  Also same with scales-not been able to weigh recently for same.  She reported doing cardiomems this morning.  Also random times of vomiting. She belches a lot.  She is going to f/u with PCP on that. Meds verified. Filled up 1.5 wks of pill boxes. Will take the bottles to pharmacy to start her on bubble packs.  I advised her that she would not have the vit d/e/fish oil in bubble packs.   I took the rest of her pill bottle to [place] so she can start bubble packs the week after next.    CBG's under 200  BP 132/70    Pulse 80    Resp 18    Wt 208 lb (94.3 kg)    LMP  (LMP Unknown)    SpO2 98%    BMI 35.70 kg/m  Weight yesterday-? Last visit weight-211  Patient Care Team: Charlott Rakes, MD as PCP - General (Family Medicine) [name], MD as PCP - Cardiology (Cardiology) [name], MD as PCP - Electrophysiology (Cardiology) [name], MD as Consulting Physician (Cardiology) [name], MD as Consulting Physician (Pulmonary Disease) System, Provider Not In  Patient Active Problem List   Diagnosis Date Noted   Hyperlipidemia associated with type 2 diabetes mellitus ([place]) 07/29/2021   (HFpEF) heart failure with preserved ejection fraction ([place]) 07/29/2021   Bradycardia 06/13/2021   Uncontrolled type 2 diabetes mellitus with hyperglycemia ([place]) 06/13/2021   Acute kidney injury superimposed on CKD ([place]) 06/13/2021   Critical ischemia of foot ([place]) 06/07/2021   Fall 05/05/2021   PAF (paroxysmal atrial fibrillation) ([place]) 05/05/2021   COPD (chronic obstructive pulmonary disease) ([place]) 05/05/2021   DM (diabetes mellitus), type  2 with complications ([place]) 07/62/2633   PAD (peripheral artery disease) ([place]) 05/05/2021   Cellulitis 05/05/2021   Acute on chronic combined systolic and diastolic CHF (congestive heart failure) ([place]) 05/05/2021   OSA (obstructive sleep apnea) 05/05/2021   CKD (chronic kidney disease) stage 3, GFR 30-59 ml/min ([place]) 05/05/2021   AKI (acute kidney injury) ([place]) 05/04/2021   Pressure injury of skin 05/04/2021   Ulcer of foot, unspecified laterality, limited to breakdown of skin ([place]) 02/17/2021   Altered mental status 01/02/2021   AF (paroxysmal atrial fibrillation) ([place]) 01/02/2021   CAD (coronary artery disease) 01/02/2021   PVD (peripheral vascular disease) ([place]) 01/02/2021   Acute renal failure superimposed on stage 4 chronic kidney disease ([place]) 01/02/2021   Diabetes mellitus type 2, uncontrolled, with complications 35/45/6256   Diabetic nephropathy ([place]) 01/02/2021   Low back pain 06/01/2020   Hyperlipidemia 04/03/2020   Muscle weakness 03/20/2020   Closed fracture of neck of right radius 03/13/2020   Pain in elbow 03/12/2020   PAD (peripheral artery disease) ([place]) 12/26/2019   Acute renal failure ([name])    Acute respiratory failure with hypoxia ([place]) 12/02/2019   Acute exacerbation of CHF (congestive heart failure) ([place]) 12/01/2019   Cellulitis in diabetic foot ([place]) 07/08/2019   Osteomyelitis ([place]) 06/21/2019   NICM (nonischemic cardiomyopathy) ([place]) 06/20/2019   Non-healing ulcer ([place]) 06/20/2019   Acute CHF (congestive heart failure) (  [place]) 11/26/2018   CHF (congestive heart failure) ([place]) 11/26/2018   Acute respiratory failure ([place]) 10/21/2018   AKI (acute kidney injury) ([place]) 08/18/2018   Coagulation disorder ([place]) 08/09/2017   Depression 07/21/2017   At risk for adverse drug reaction 06/20/2017   Peripheral neuropathy 06/20/2017   Acute osteomyelitis of right foot ([place]) 06/13/2017   S/P transmetatarsal amputation of foot, right  ([place]) 06/05/2017   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 ([place]) 05/19/2017   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[place])    SIRS (systemic inflammatory response syndrome) ([place]) 04/06/2017   CKD (chronic kidney disease), stage III ([place]) 11/24/2016   Suspected sleep apnea 11/24/2016   Ulcer of toe of right foot, with necrosis of bone ([place]) 10/27/2016   Ulcer of left lower leg ([place]) 05/19/2016   Severe obesity (BMI >= 40) ([place]) 02/24/2016   COPD GOLD 0  02/24/2016   Morbid obesity ([place]) 02/24/2016   Encounter for therapeutic drug monitoring 02/10/2016   Symptomatic bradycardia 01/12/2016   Hypertension associated with diabetes ([place]) 12/22/2015   Chronic combined systolic and diastolic CHF (congestive heart failure) ([place])    [place]    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 11/08/2015   Tobacco abuse 10/23/2015   Coronary artery disease    DOE (dyspnea on exertion) 04/29/2015   Diabetes mellitus type 2, uncontrolled 02/08/2015   Influenza A 02/07/2015   Wrist fracture, left, with routine healing, subsequent encounter 02/05/2015   Wrist fracture, left, closed, initial encounter 01/29/2015   PAF (paroxysmal atrial fibrillation) ([place]) 01/16/2015   Carotid arterial disease ([place]) 01/16/2015   Claudication ([place]) 01/15/2015   Demand ischemia ([place]) 10/29/2014   Insomnia 02/03/2014   COPD with acute exacerbation ([place]) 02/01/2014   S/P peripheral artery angioplasty - TurboHawk atherectomy; R SFA 09/11/2013    Class: Acute   Leg pain, bilateral 08/19/2013   Hypothyroidism 07/31/2013   Cellulitis 06/13/2013   History of cocaine abuse ([place]) 06/13/2013   Long term current use of anticoagulant therapy 05/20/2013   Alcohol abuse    Narcotic abuse ([place])    Marijuana abuse    Alcoholic cirrhosis ([place])    DM (diabetes mellitus), type 2 with peripheral vascular complications ([name])     Current Outpatient Medications:     ACCU-CHEK GUIDE test strip, USE TO CHECK BLOOD SUGAR FOUR TIMES DAILY, Disp: , Rfl:    acetaminophen (TYLENOL) 500 MG tablet, Take 500 mg by mouth every 6 (six) hours as needed for moderate pain., Disp: , Rfl:    albuterol (VENTOLIN HFA) 108 (90 Base) MCG/ACT inhaler, Inhale 2 puffs into the lungs every 6 (six) hours as needed for wheezing or shortness of breath., Disp: 18 g, Rfl: 3   allopurinol (ZYLOPRIM) 100 MG tablet, Take 1 tablet (100 mg total) by mouth daily., Disp: 30 tablet, Rfl: 6   amiodarone (PACERONE) 200 MG tablet, TAKE 1/2 TABLET (100 MG TOTAL) BY MOUTH DAILY., Disp: 15 tablet, Rfl: 3   amLODipine (NORVASC) 10 MG tablet, Take 1 tablet (10 mg total) by mouth daily., Disp: 90 tablet, Rfl: 3   apixaban (ELIQUIS) 5 MG TABS tablet, Take 1 tablet (5 mg total) by mouth 2 (two) times daily., Disp: 60 tablet, Rfl: 8   atorvastatin (LIPITOR) 40 MG tablet, Take 1 tablet (40 mg total) by mouth every evening., Disp: 90 tablet, Rfl: 1   budesonide-formoterol (SYMBICORT) 160-4.5 MCG/ACT inhaler, Inhale 2 puffs into the lungs 2 (two) times daily., Disp: 1 Inhaler, Rfl: 2   buPROPion [facility]  SR) 150 MG 12 hr tablet, TAKE 1 TABLET BY MOUTH DAILY FOR 3 DAYS THEN INCREASE TO 1 TABLET BY MOUTH 2 TIMES DAILY., Disp: 60 tablet, Rfl: 2   clopidogrel (PLAVIX) 75 MG tablet, Take 1 tablet (75 mg total) by mouth daily., Disp: 30 tablet, Rfl: 11   Dulaglutide (TRULICITY) 1.5 IO/2.7OJ SOPN, Inject 1.5 mg into the skin once a week., Disp: 2 mL, Rfl: 6   empagliflozin (JARDIANCE) 10 MG TABS tablet, Take 1 tablet (10 mg total) by mouth daily before breakfast., Disp: 90 tablet, Rfl: 3   ferrous sulfate 324 MG TBEC, Take 324 mg by mouth., Disp: , Rfl:    FLUoxetine (PROZAC) 20 MG capsule, Take 1 capsule (20 mg total) by mouth at bedtime., Disp: 90 capsule, Rfl: 1   folic acid (FOLVITE) 1 MG tablet, TAKE 1 TABLET (1 MG TOTAL) BY MOUTH DAILY., Disp: 90 tablet, Rfl: 1   hydrALAZINE (APRESOLINE) 50 MG  tablet, Take 1 tablet (50 mg total) by mouth 3 (three) times daily., Disp: 90 tablet, Rfl: 6   insulin glargine (LANTUS) 100 UNIT/ML Solostar Pen, Inject 30 Units into the skin daily., Disp: 30 mL, Rfl: 6   insulin lispro (HUMALOG) 100 UNIT/ML KwikPen, Inject 8 Units into the skin 3 (three) times daily with meals., Disp: 15 mL, Rfl: 0   Insulin Pen Needle 32G X 4 MM MISC, USE AS DIRECTED WITH INSULIN PENS, Disp: 100 each, Rfl: 0   levothyroxine (SYNTHROID) 50 MCG tablet, Take 1 tablet (50 mcg total) by mouth daily., Disp: 90 tablet, Rfl: 1   losartan (COZAAR) 25 MG tablet, Take 2 tablets (50 mg total) by mouth daily. (Patient taking differently: Take 50 mg by mouth at bedtime.), Disp: 180 tablet, Rfl: 0   Omega-3 Fatty Acids (FISH OIL PO), Take 1 capsule by mouth daily., Disp: , Rfl:    pantoprazole (PROTONIX) 40 MG tablet, Take 1 tablet (40 mg total) by mouth daily., Disp: 90 tablet, Rfl: 0   potassium chloride SA (KLOR-CON M20) 20 MEQ tablet, Take 1 tablet (20 mEq total) by mouth daily., Disp: 90 tablet, Rfl: 3   torsemide (DEMADEX) 20 MG tablet, Take 3 tablets (60 mg total) by mouth 2 (two) times daily., Disp: 240 tablet, Rfl: 11   VITAMIN D PO, Take 1 tablet by mouth daily., Disp: , Rfl:    benzonatate (TESSALON) 100 MG capsule, Take 1 capsule (100 mg total) by mouth 2 (two) times daily as needed for cough. (Patient not taking: Reported on 12/09/2021), Disp: 40 capsule, Rfl: 0   colchicine 0.6 MG tablet, TAKE 2 TABS (1.2 MG) AT THE ONSET OF A GOUT FLARE, MAY TAKE 1 TAB (0.6 MG) IN 1 HOUR IF SYMPTOMS (Patient not taking: Reported on 12/29/2021), Disp: 30 tablet, Rfl: 3   Continuous Blood Gluc Sensor (FREESTYLE LIBRE 2 SENSOR) MISC, USE 1 (ONE) EACH EVERY 2 WEEKS (Patient not taking: Reported on 12/03/2021), Disp: 2 each, Rfl: 11   HYDROcodone-acetaminophen (NORCO/VICODIN) 5-325 MG tablet, Take 1 tablet by mouth every 4 (four) hours as needed. (Patient not taking: Reported on 12/09/2021),  Disp: 10 tablet, Rfl: 0   Misc. Devices MISC, 2 L oxygen continuously via nasal cannula.  Diagnosis chronic respiratory failure with hypoxia (Patient not taking: Reported on 12/03/2021), Disp: 1 each, Rfl: 0   ondansetron (ZOFRAN) 4 MG tablet, Take 1 tablet (4 mg total) by mouth every 8 (eight) hours as needed for nausea or vomiting. (Patient not taking: Reported on 12/29/2021), Disp: 20 tablet, Rfl:  0 Allergies  Allergen Reactions   Gabapentin Nausea And Vomiting and Other (See Comments)    POSSIBLE SHAKING   Lyrica [Pregabalin] Other (See Comments)    Shaking       Social History   Socioeconomic History   Marital status: Single    Spouse name: Not on file   Number of children: 1   Years of education: 12   Highest education level: 12th grade  Occupational History   Occupation: disabled  Tobacco Use   Smoking status: Every Day    Packs/day: 1.00    Years: 44.00    Pack years: 44.00    Types: E-cigarettes, Cigarettes   Smokeless tobacco: Former    Types: Snuff  Vaping Use   Vaping Use: Former   Devices: 11/26/2018 "stopped months ago"  Substance and Sexual Activity   Alcohol use: Yes    Comment: occ   Drug use: Yes    Types: "Crack" cocaine, Marijuana, Oxycodone   Sexual activity: Not Currently  Other Topics Concern   Not on file  Social History Narrative   ** Merged History Encounter **       Lives in [place], in motel with sister.  They are looking to move but don't have a place to go yet.     Social Determinants of Health   Financial Resource Strain: Not on file  Food Insecurity: Not on file  Transportation Needs: Not on file  Physical Activity: Not on file  Stress: Not on file  Social Connections: Not on file  Intimate Partner Violence: Not on file    Physical Exam      Future Appointments  Date Time Provider [place]  01/11/2022  3:30 PM [name], MD [facility]  01/31/2022  1:30 PM [facility] PA/NP [facility] None   04/04/2022  2:00 PM Larey Dresser, MD [place] None       Marylouise Stacks, [place] [facility] Paramedic  12/29/21

## 2022-01-11 ENCOUNTER — Other Ambulatory Visit: Payer: Self-pay

## 2022-01-11 ENCOUNTER — Ambulatory Visit (INDEPENDENT_AMBULATORY_CARE_PROVIDER_SITE_OTHER): Payer: Medicare Other | Admitting: Cardiovascular Disease

## 2022-01-11 ENCOUNTER — Other Ambulatory Visit (HOSPITAL_COMMUNITY): Payer: Self-pay

## 2022-01-11 ENCOUNTER — Other Ambulatory Visit: Payer: Self-pay | Admitting: Family Medicine

## 2022-01-11 ENCOUNTER — Encounter: Payer: Self-pay | Admitting: Cardiovascular Disease

## 2022-01-11 DIAGNOSIS — I480 Paroxysmal atrial fibrillation: Secondary | ICD-10-CM

## 2022-01-11 DIAGNOSIS — F419 Anxiety disorder, unspecified: Secondary | ICD-10-CM

## 2022-01-11 DIAGNOSIS — E782 Mixed hyperlipidemia: Secondary | ICD-10-CM | POA: Diagnosis not present

## 2022-01-11 DIAGNOSIS — I5042 Chronic combined systolic (congestive) and diastolic (congestive) heart failure: Secondary | ICD-10-CM | POA: Diagnosis not present

## 2022-01-11 DIAGNOSIS — I739 Peripheral vascular disease, unspecified: Secondary | ICD-10-CM | POA: Diagnosis not present

## 2022-01-11 DIAGNOSIS — I6521 Occlusion and stenosis of right carotid artery: Secondary | ICD-10-CM | POA: Diagnosis not present

## 2022-01-11 DIAGNOSIS — Z720 Tobacco use: Secondary | ICD-10-CM

## 2022-01-11 NOTE — Assessment & Plan Note (Signed)
History of hyperlipidemia on statin therapy with lipid profile performed 03/08/2021 revealing total cholesterol of 129, LDL 59 and HDL 47.

## 2022-01-11 NOTE — Telephone Encounter (Signed)
-----   Message from Marylouise Stacks, EMT sent at 01/11/2022  2:42 PM EST ----- Hey,  she is being switched to bubble packs vs pill bottles, can you see if dr Margarita Rana can write a script for her iron so that can be added to bubble pack fill as well?   Thank you, Marylouise Stacks, EMT-Paramedic  01/11/22

## 2022-01-11 NOTE — Assessment & Plan Note (Signed)
History of combined systolic and diastolic heart failure.  Her ejection fraction has been reduced in the past in the 30 to 35% range.  I did refer her to Dr. Aundra Dubin who has placed a CardioMEMS device and has her on optimal medical therapy.  Her most recent 2D echocardiogram performed 03/08/2021 revealed normal LV systolic function with grade 2 diastolic dysfunction.

## 2022-01-11 NOTE — Progress Notes (Signed)
Went by pharmacy to get her bubble pack of meds, pharmacy was only able to do 1 week right now, he has to wait on new rx to be sent in for the fluoxetine.  I dropped off bubble pack but she was not back yet from doc office, I left with family that was there at the house.  Will f/u next week.  I checked al meds in bubble pack and it was correct by list in Epic.    Marylouise Stacks, EMT-Paramedic  01/11/22

## 2022-01-11 NOTE — Progress Notes (Signed)
01/11/2022 [name]   1961-05-19  [id number]  Primary Physician Karla Rakes, MD Primary Cardiologist: Karla Harp MD [name], [name], [name]  HPI:  [name] is a 61 y.o.  moderately overweight single Caucasian female mother of one child who lives with her sister. She was originally referred by [name] at [facility] for peripheral vascular evaluation because of critical limb ischemia. I last saw her in the office 06/25/2021.[name] She sees Dr. Jenkins [name] at [name] for cardiomyopathy and paroxysmal atrial fibrillation. Her cardiac risk factors include a 20-pack-year history of tobacco abuse (she has stopped smoking), treated diabetes, and hypertension as well as family history of heart disease. She's never had a heart attack or stroke. She does have COPD. She had paroxysmal atrial fibrillation in the past and has undergone cardioversion in [place].. The lower extremity arterial Doppler studies performed in our office suggested critical limb ischemia with an occluded SFA and popliteal arteries bilaterally with tibial vessel disease as well. She complains of lifestyle limiting claudication. I performed lower extremity angiography on 09/10/13 revealing occluded SFAs bilaterally with chronic occluded tibial vessels as well. I performed [place]  directional atherectomy of her entire right SFA with an excellent angiographic result removing a copious amount of atherosclerotic plaque. The ulcer on her right heel ultimately healed. She does continue [name] smoke one pack per Karla. She was recently admitted [name] [name] because of atrial fibrillation with rapid ventricular response. She underwent cardiac catheterization on 10/31/14 revealing an ejection fraction of 30-35% with disease that was ultimately treated medically. Her carotid Doppler showed high-grade bilateral ICA disease and lower extremity Dopplers revealed restenosis within the right SFA with a ABI of  0.34. She is symptomatic on that side. I performed angiography on her 01/15/15 revealing an occluded right internal carotid artery, 95% proximal left internal carotid artery stenosis, long 99% stenosis within the right SFA and an occluded left SFA. I performed [name] 1  directional atherectomy and drug-eluting balloon angioplasty of her right SFA with an excellent  Angiographic and clinical result. She underwent left carotid endarterectomy by [name] on 02/19/15 which was uncomplicated. Since I saw her 6 months ago she's been remaining clinically stable. She denies chest pain or shortness of breath. She continues [name] have claudication.Lower extremity Doppler studies performed 01/23/15 revealed ABIs in the 0.6 range bilaterally with a patent right SFA. She continues [name] complain of claudication however.recent Dopplers performed 12/22/15 revealed a right TBI 0. 17. She was admitted [name] the [name] with altered mental status and sepsis 04/06/17 for one week. 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 I suspect performed angiography of her 04/13/17 revealing an occluded right SFA was 0 vessel runoff. Atherectomy I's are entire right SFA and she subsequently underwent right second ray resection by [name] on 04/28/17. She has stopped smoking since that hospitalization. She also underwent a right transmetatarsal amputation by Dr. Sharol Given which has subsequently healed. Recent lower extremity Dopplers performed 05/29/17 revealed a right ABI 0.65 with a patent right SFA and a left ABI 0.31.     She does have diastolic heart failure followed by Dr. Aundra [name] in the heart failure clinic.  She has had a CardioMEMS device implanted [name] follow her pulmonary pressures and lung impedance..  She does admit [name] dietary indiscretion.  She was diuresed 30 pounds.  She is aware of salt restriction.  She is on oral diuretics and  weighs herself on a daily basis.  She denies claudication but does have pain in her feet suggestive of  diabetic peripheral neuropathy.   She was admitted [name] the [name] in August of last year with an infected diabetic left foot ulcer and ultimately underwent left fem-tib bypass grafting by Dr. Sherren Mocha [name] 06/28/2019 which he follows by duplex ultrasound on outpatient basis.  She unfortunately is gone back [name] smoking half a pack per Karla.  She denies chest pain or shortness of breath.  She is aware of salt restriction.  She was seen in our office by Karla Presume, [name] with a nonhealing wound on her right heel.  I performed lower extremity angiography and intervention 06/07/2021 revealing a patent left femoropopliteal bypass graft, and occluded right SFA, popliteal and tibial vessels.  I was able [name] revascularize a long right SFA CTO with directional atherectomy and drug-coated balloon angioplasty.  Her Dopplers showed improvement in her right SFA although her right ABI continues [name] be severely depressed at 0.46 because her popliteal and tibial vessel occlusion.  I do not think she has additional endovascular opportunities for inline flow.  She is seeing wound care center.  We talked about the importance of smoking cessation.  Since I saw her 6 months ago her right foot ulcer has healed.  She stopped smoking 2 weeks ago.  She feels clinically improved.  She denies chest pain, shortness of breath or claudication.     Current Meds  Medication Sig   ACCU-CHEK GUIDE test strip USE [name] CHECK BLOOD SUGAR FOUR TIMES DAILY   acetaminophen (TYLENOL) 500 MG tablet Take 500 mg by mouth every 6 (six) hours as needed for moderate pain.   albuterol (VENTOLIN HFA) 108 (90 Base) MCG/ACT inhaler Inhale 2 puffs into the lungs every 6 (six) hours as needed for wheezing or shortness of breath.   allopurinol (ZYLOPRIM) 100 MG tablet Take 1 tablet (100 mg total) by mouth daily.   amiodarone (PACERONE) 200 MG tablet TAKE 1/2 TABLET (100 MG TOTAL) BY MOUTH DAILY.   amLODipine (NORVASC) 10 MG tablet Take 1 tablet (10 mg total)  by mouth daily.   apixaban (ELIQUIS) 5 MG TABS tablet Take 1 tablet (5 mg total) by mouth 2 (two) times daily.   atorvastatin (LIPITOR) 40 MG tablet Take 1 tablet (40 mg total) by mouth every evening.   budesonide-formoterol (SYMBICORT) 160-4.5 MCG/ACT inhaler Inhale 2 puffs into the lungs 2 (two) times daily.   buPROPion (WELLBUTRIN SR) 150 MG 12 hr tablet TAKE 1 TABLET BY MOUTH DAILY FOR 3 DAYS THEN INCREASE [name] 1 TABLET BY MOUTH 2 TIMES DAILY.   clopidogrel (PLAVIX) 75 MG tablet Take 1 tablet (75 mg total) by mouth daily.   colchicine 0.6 MG tablet TAKE 2 TABS (1.2 MG) AT THE ONSET OF A GOUT FLARE, MAY TAKE 1 TAB (0.6 MG) IN 1 HOUR IF SYMPTOMS (Patient taking differently: daily. Take 2 tabs (1.2 mg) at the onset of a gout flare, may take 1 tab (0.6 mg) in 1 hour if symptoms)   Continuous Blood Gluc Sensor (FREESTYLE LIBRE 2 SENSOR) MISC USE 1 (ONE) EACH EVERY 2 WEEKS   Dulaglutide (TRULICITY) 1.5 FU/9.3AT SOPN Inject 1.5 mg into the skin once a week.   empagliflozin (JARDIANCE) 10 MG TABS tablet Take 1 tablet (10 mg total) by mouth daily before breakfast.   ferrous sulfate 324 MG TBEC Take 324 mg by mouth.   FLUoxetine (PROZAC) 20 MG capsule Take 1 capsule (20 mg total)  by mouth at bedtime.   folic acid (FOLVITE) 1 MG tablet TAKE 1 TABLET (1 MG TOTAL) BY MOUTH DAILY.   hydrALAZINE (APRESOLINE) 50 MG tablet Take 1 tablet (50 mg total) by mouth 3 (three) times daily.   insulin glargine (LANTUS) 100 UNIT/ML Solostar Pen Inject 30 Units into the skin daily.   insulin lispro (HUMALOG) 100 UNIT/ML KwikPen Inject 8 Units into the skin 3 (three) times daily with meals.   levothyroxine (SYNTHROID) 50 MCG tablet Take 1 tablet (50 mcg total) by mouth daily.   losartan (COZAAR) 25 MG tablet Take 2 tablets (50 mg total) by mouth daily. (Patient taking differently: Take 50 mg by mouth at bedtime.)   Omega-3 Fatty Acids (FISH OIL PO) Take 1 capsule by mouth daily.   ondansetron (ZOFRAN) 4 MG tablet Take 1  tablet (4 mg total) by mouth every 8 (eight) hours as needed for nausea or vomiting.   pantoprazole (PROTONIX) 40 MG tablet Take 1 tablet (40 mg total) by mouth daily.   potassium chloride SA (KLOR-CON M20) 20 MEQ tablet Take 1 tablet (20 mEq total) by mouth daily.   torsemide (DEMADEX) 20 MG tablet Take 3 tablets (60 mg total) by mouth 2 (two) times daily.   VITAMIN D PO Take 1 tablet by mouth daily.     Allergies  Allergen Reactions   Gabapentin Nausea And Vomiting and Other (See Comments)    POSSIBLE SHAKING   Lyrica [Pregabalin] Other (See Comments)    Shaking     Social History   Socioeconomic History   Marital status: Single    Spouse name: Not on file   Number of children: 1   Years of education: 12   Highest education level: 12th grade  Occupational History   Occupation: disabled  Tobacco Use   Smoking status: Former    Packs/Karla: 1.00    Years: 44.00    Pack years: 44.00    Types: E-cigarettes, Cigarettes   Smokeless tobacco: Former    Types: Snuff  Vaping Use   Vaping Use: Former   Devices: 11/26/2018 "stopped months ago"  Substance and Sexual Activity   Alcohol use: Yes    Comment: occ   Drug use: Yes    Types: "Crack" cocaine, Marijuana, Oxycodone   Sexual activity: Not Currently  Other Topics Concern   Not on file  Social History Narrative   ** Merged History Encounter **       Lives in [place], in motel with sister.  They are looking [name] move but don't have a place [name] go yet.     Social Determinants of Health   Financial Resource Strain: Not on file  Food Insecurity: Not on file  Transportation Needs: Not on file  Physical Activity: Not on file  Stress: Not on file  Social Connections: Not on file  Intimate Partner Violence: Not on file     Review of Systems: General: negative for chills, fever, night sweats or weight changes.  Cardiovascular: negative for chest pain, dyspnea on exertion, edema, orthopnea, palpitations, paroxysmal nocturnal  dyspnea or shortness of breath Dermatological: negative for rash Respiratory: negative for cough or wheezing Urologic: negative for hematuria Abdominal: negative for nausea, vomiting, diarrhea, bright red blood per rectum, melena, or hematemesis Neurologic: negative for visual changes, syncope, or dizziness All other systems reviewed and are otherwise negative except as noted above.    Blood pressure (!) 146/70, pulse 67, height 5\' 4"  (1.626 m), weight 224 lb (101.6 kg), SpO2 95 %.  General appearance: alert and no distress Neck: no adenopathy, no JVD, supple, symmetrical, trachea midline, thyroid not enlarged, symmetric, no tenderness/mass/nodules, and right carotid bruit Lungs: clear [name] auscultation bilaterally Heart: regular rate and rhythm, S1, S2 normal, no murmur, click, rub or gallop Extremities: extremities normal, atraumatic, no cyanosis or edema Pulses: Diminished pedal pulses Skin: Skin color, texture, turgor normal. No rashes or lesions Neurologic: Grossly normal  EKG sinus rhythm at 67 with septal Q waves.  I personally reviewed this EKG.  ASSESSMENT AND PLAN:   Claudication [name] History of peripheral arterial disease along with critical limb ischemia.  She had multiple interventions on her lower extremities by myself as well as an urgent left fem-tib bypass graft by Dr. Sherren Mocha [name] 06/28/2019.  Because of a wound on her right TMA suture site I performed repeat angiography on her 06/07/2021 revealing a patent left fem-tib bypass graft, and occluded right SFA and popliteal vessel with 0 vessel runoff.  I ended up performing [name] 1 directional atherectomy followed by [name] reducing along right SFA CTO [name] 0% residual.  Her wounds ultimately healed and her most recent Doppler studies performed 12/22/2021 revealed her right SFA [name] be patent.  She denies claudication.  PAF (paroxysmal atrial fibrillation) ([name]) History of PAF in the past maintaining sinus rhythm on amiodarone and  Eliquis.  Carotid arterial disease ([name]) History of carotid artery disease with a known occluded right internal carotid artery with a widely patent left carotid endarterectomy site.  This will be repeated on an annual basis.  Tobacco abuse Long history of tobacco abuse having quit 2 weeks ago.  Chronic combined systolic and diastolic CHF (congestive heart failure) ([name]) History of combined systolic and diastolic heart failure.  Her ejection fraction has been reduced in the past in the 30 [name] 35% range.  I did refer her [name] Dr. Aundra [name] who has placed a CardioMEMS device and has her on optimal medical therapy.  Her most recent 2D echocardiogram performed 03/08/2021 revealed normal LV systolic function with grade 2 diastolic dysfunction.  Hyperlipidemia History of hyperlipidemia on statin therapy with lipid profile performed 03/08/2021 revealing total cholesterol of 129, LDL 59 and HDL 47.     Karla Harp MD [facility], [name] 01/11/2022 4:35 PM

## 2022-01-11 NOTE — Assessment & Plan Note (Signed)
History of carotid artery disease with a known occluded right internal carotid artery with a widely patent left carotid endarterectomy site.  This will be repeated on an annual basis.

## 2022-01-11 NOTE — Assessment & Plan Note (Signed)
Long history of tobacco abuse having quit 2 weeks ago.

## 2022-01-11 NOTE — Telephone Encounter (Signed)
-----   Message from Marylouise Stacks, EMT sent at 01/11/2022  3:09 PM EST ----- To [place]   ----- Message ----- From: Matilde Sprang, RN Sent: 01/11/2022   2:50 PM EST To: Marylouise Stacks, EMT  Which pharmacy to send in the request? A telephone encounter was created.  ----- Message ----- From: Marylouise Stacks, EMT Sent: 01/11/2022   2:44 PM EST To: [facility] Refill Pool  Hey,  she is being switched to bubble packs vs pill bottles, can you see if dr Margarita Rana can write a script for her iron so that can be added to bubble pack fill as well?   Thank you, Marylouise Stacks, EMT-Paramedic  01/11/22

## 2022-01-11 NOTE — Telephone Encounter (Signed)
Requested medication (s) are due for refill today: Yes  Requested medication (s) are on the active medication list: Yes  Last refill:  10/14/21-historical provider  Future visit scheduled: No  Notes to clinic:  Unable to refill per protocol, last refill by another provider. Pharmacy requesting to be refilled in bubble pack      Requested Prescriptions  Pending Prescriptions Disp Refills   ferrous sulfate 324 MG TBEC 30 tablet     Sig: Take 1 tablet (324 mg total) by mouth.     Endocrinology:  Minerals - Iron Supplementation Failed - 01/11/2022  3:55 PM      Failed - HGB in normal range and within 360 days    Hemoglobin  Date Value Ref Range Status  10/27/2021 9.8 (L) 12.0 - 15.0 g/dL Final  05/25/2021 10.5 (L) 11.1 - 15.9 g/dL Final   Total hemoglobin  Date Value Ref Range Status  12/03/2015 9.6 (L) 12.0 - 16.0 g/dL Final          Failed - HCT in normal range and within 360 days    HCT  Date Value Ref Range Status  10/27/2021 32.4 (L) 36.0 - 46.0 % Final   Hematocrit  Date Value Ref Range Status  05/25/2021 35.5 34.0 - 46.6 % Final          Failed - RBC in normal range and within 360 days    RBC  Date Value Ref Range Status  10/27/2021 3.68 (L) 3.87 - 5.11 MIL/uL Final          Failed - Fe (serum) in normal range and within 360 days    Iron  Date Value Ref Range Status  11/29/2018 38 28 - 170 ug/dL Final   Saturation Ratios  Date Value Ref Range Status  11/29/2018 9 (L) 10.4 - 31.8 % Final          Failed - Ferritin in normal range and within 360 days    Ferritin  Date Value Ref Range Status  11/29/2018 61 11 - 307 ng/mL Final    Comment:    Performed at [facility], [place] [address], [place], [place] [id number]          Passed - Valid encounter within last 12 months    Recent Outpatient Visits           1 month ago DM (diabetes mellitus), type 2 with peripheral vascular complications ([place])   [place], [place], MD   3 months ago DM (diabetes mellitus), type 2 with peripheral vascular complications [facility])   [place] Oral, [place], [place]   5 months ago DM (diabetes mellitus), type 2 with peripheral vascular complications [facility])   [place], Charlane Ferretti, MD   6 months ago DM (diabetes mellitus), type 2 with peripheral vascular complications [facility])   [place], Charlane Ferretti, MD   6 years ago Type 2 diabetes mellitus with hyperglycemia   [facility] Charlott Rakes, MD

## 2022-01-11 NOTE — Assessment & Plan Note (Signed)
History of PAF in the past maintaining sinus rhythm on amiodarone and Eliquis.

## 2022-01-11 NOTE — Assessment & Plan Note (Signed)
History of peripheral arterial disease along with critical limb ischemia.  She had multiple interventions on her lower extremities by myself as well as an urgent left fem-tib bypass graft by Dr. Sherren Mocha Early 06/28/2019.  Because of a wound on her right TMA suture site I performed repeat angiography on her 06/07/2021 revealing a patent left fem-tib bypass graft, and occluded right SFA and popliteal vessel with 0 vessel runoff.  I ended up performing [facility] 1 directional atherectomy followed by [facility] reducing along right SFA CTO to 0% residual.  Her wounds ultimately healed and her most recent Doppler studies performed 12/22/2021 revealed her right SFA to be patent.  She denies claudication.

## 2022-01-11 NOTE — Patient Instructions (Signed)
Medication Instructions:  Your physician recommends that you continue on your current medications as directed. Please refer to the Current Medication list given to you today.  *If you need a refill on your cardiac medications before your next appointment, please call your pharmacy*   Testing/Procedures: Your physician has requested that you have a carotid duplex. This test is an ultrasound of the carotid arteries in your neck. It looks at blood flow through these arteries that supply the brain with blood. Allow one hour for this exam. There are no restrictions or special instructions. To be done in July. This procedure is done at [place].    Your physician has requested that you have a lower extremity arterial duplex. This test is an ultrasound of the arteries in the legs. It looks at arterial blood flow in the legs. Allow one hour for Lower Arterial scans. There are no restrictions or special instructions   Your physician has requested that you have an ankle brachial index (ABI). During this test an ultrasound and blood pressure cuff are used to evaluate the arteries that supply the arms and legs with blood. Allow thirty minutes for this exam. There are no restrictions or special instructions.  To do in Feb 2024. These procedures are done at [place].   Follow-Up: At [facility], you and your health needs are our priority.  As part of our continuing mission to provide you with exceptional heart care, we have created designated Provider Care Teams.  These Care Teams include your primary Cardiologist (physician) and Advanced Practice Providers (APPs -  Physician Assistants and Nurse Practitioners) who all work together to provide you with the care you need, when you need it.  We recommend signing up for the patient portal called "MyChart".  Sign up information is provided on this After Visit Summary.  MyChart is used to connect with patients for Virtual Visits  (Telemedicine).  Patients are able to view lab/test results, encounter notes, upcoming appointments, etc.  Non-urgent messages can be sent to your provider as well.   To learn more about what you can do with MyChart, go to [email or web address].    Your next appointment:   6 month(s)  The format for your next appointment:   In Person  Provider:   Caron Presume, PA-C      Then, Quay Burow, MD will plan to see you again in 12 month(s).

## 2022-01-12 NOTE — Telephone Encounter (Signed)
Requested Prescriptions  ?Pending Prescriptions Disp Refills  ?? pantoprazole (PROTONIX) 40 MG tablet [Pharmacy Med Name: PANTOPRAZOLE SODIUM 40 MG ORAL TABLET DELAYED RELEASE] 90 tablet 0  ?  Sig: TAKE 1 TABLET (40 MG TOTAL) BY MOUTH DAILY.  ?  ? Gastroenterology: Proton Pump Inhibitors Passed - 01/11/2022 12:55 PM  ?  ?  Passed - Valid encounter within last 12 months  ?  Recent Outpatient Visits   ?      ? 1 month ago DM (diabetes mellitus), type 2 with peripheral vascular complications ([place])  ? [place], Charlane Ferretti, MD  ? 3 months ago DM (diabetes mellitus), type 2 with peripheral vascular complications ([place])  ? [place], [place], [place]  ? 5 months ago DM (diabetes mellitus), type 2 with peripheral vascular complications ([place])  ? [place], Charlane Ferretti, MD  ? 6 months ago DM (diabetes mellitus), type 2 with peripheral vascular complications ([place])  ? [place], Charlane Ferretti, MD  ? 6 years ago Type 2 diabetes mellitus with hyperglycemia  ? [place], Charlane Ferretti, MD  ?  ?  ? ?  ?  ?  ?? FLUoxetine (PROZAC) 20 MG capsule [Pharmacy Med Name: FLUOXETINE HCL 20 MG ORAL CAPSULE] 90 capsule 0  ?  Sig: TAKE 1 CAPSULE (20 MG TOTAL) BY MOUTH AT BEDTIME.  ?  ? Psychiatry:  Antidepressants - SSRI Failed - 01/11/2022 12:55 PM  ?  ?  Failed - Completed PHQ-2 or PHQ-9 in the last 360 days  ?  ?  Passed - Valid encounter within last 6 months  ?  Recent Outpatient Visits   ?      ? 1 month ago DM (diabetes mellitus), type 2 with peripheral vascular complications ([place])  ? [place], Charlane Ferretti, MD  ? 3 months ago DM (diabetes mellitus), type 2 with peripheral vascular complications ([place])  ? [place], [place], [place]  ? 5 months ago DM (diabetes mellitus), type 2 with peripheral  vascular complications ([place])  ? [place], Charlane Ferretti, MD  ? 6 months ago DM (diabetes mellitus), type 2 with peripheral vascular complications ([place])  ? [place], Charlane Ferretti, MD  ? 6 years ago Type 2 diabetes mellitus with hyperglycemia  ? [place] Charlott Rakes, MD  ?  ?  ? ?  ?  ?  ? ?

## 2022-01-13 MED ORDER — FERROUS SULFATE 324 MG PO TBEC: 324.0000 mg | DELAYED_RELEASE_TABLET | Freq: Two times a day (BID) | ORAL | 3 refills | Status: DC | PRN

## 2022-01-14 ENCOUNTER — Ambulatory Visit (HOSPITAL_COMMUNITY)
Admission: RE | Admit: 2022-01-14 | Discharge: 2022-01-14 | Disposition: A | Discharge status: Home / Self Care | Payer: Medicare Other | Source: Ambulatory Visit | Attending: Cardiology | Admitting: Cardiology

## 2022-01-14 ENCOUNTER — Other Ambulatory Visit: Payer: Self-pay

## 2022-01-14 ENCOUNTER — Ambulatory Visit: Payer: Medicare Other | Admitting: Podiatry

## 2022-01-14 DIAGNOSIS — Z9889 Other specified postprocedural states: Secondary | ICD-10-CM | POA: Insufficient documentation

## 2022-01-14 DIAGNOSIS — I6521 Occlusion and stenosis of right carotid artery: Secondary | ICD-10-CM | POA: Insufficient documentation

## 2022-01-16 IMAGING — DX DG ELBOW COMPLETE 3+V*R*
1 series · 4 of 4 positions shown · non-contrast
Comparison: 02/09/2021

CLINICAL DATA: Status post fall.  Trauma

EXAM:
RIGHT ELBOW - COMPLETE 3+ VIEW

[Series 1: elbow · 0.14mm/px · 4 of 4 slices shown]
[im 1/4]
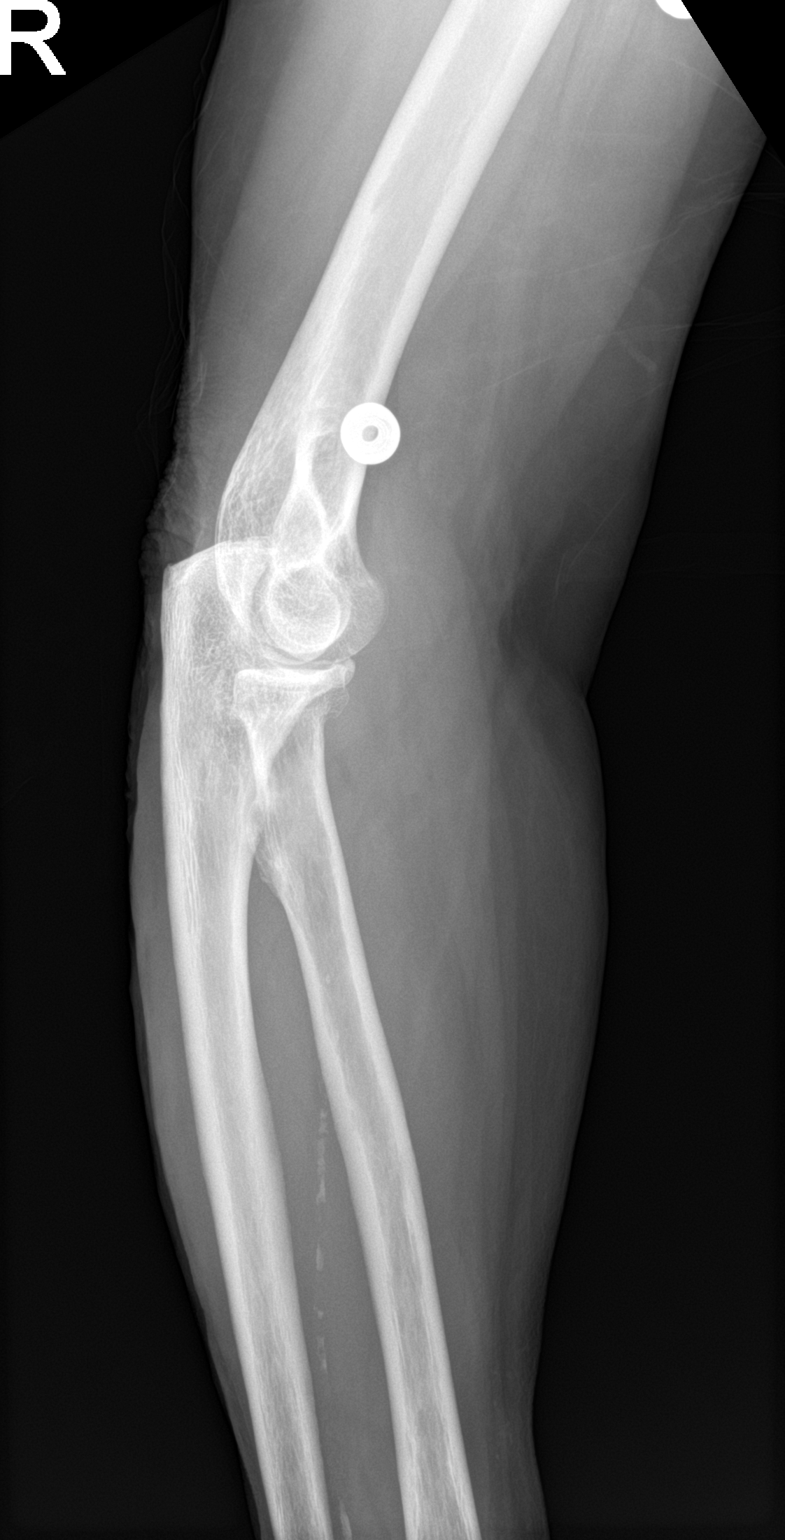
[im 2/4]
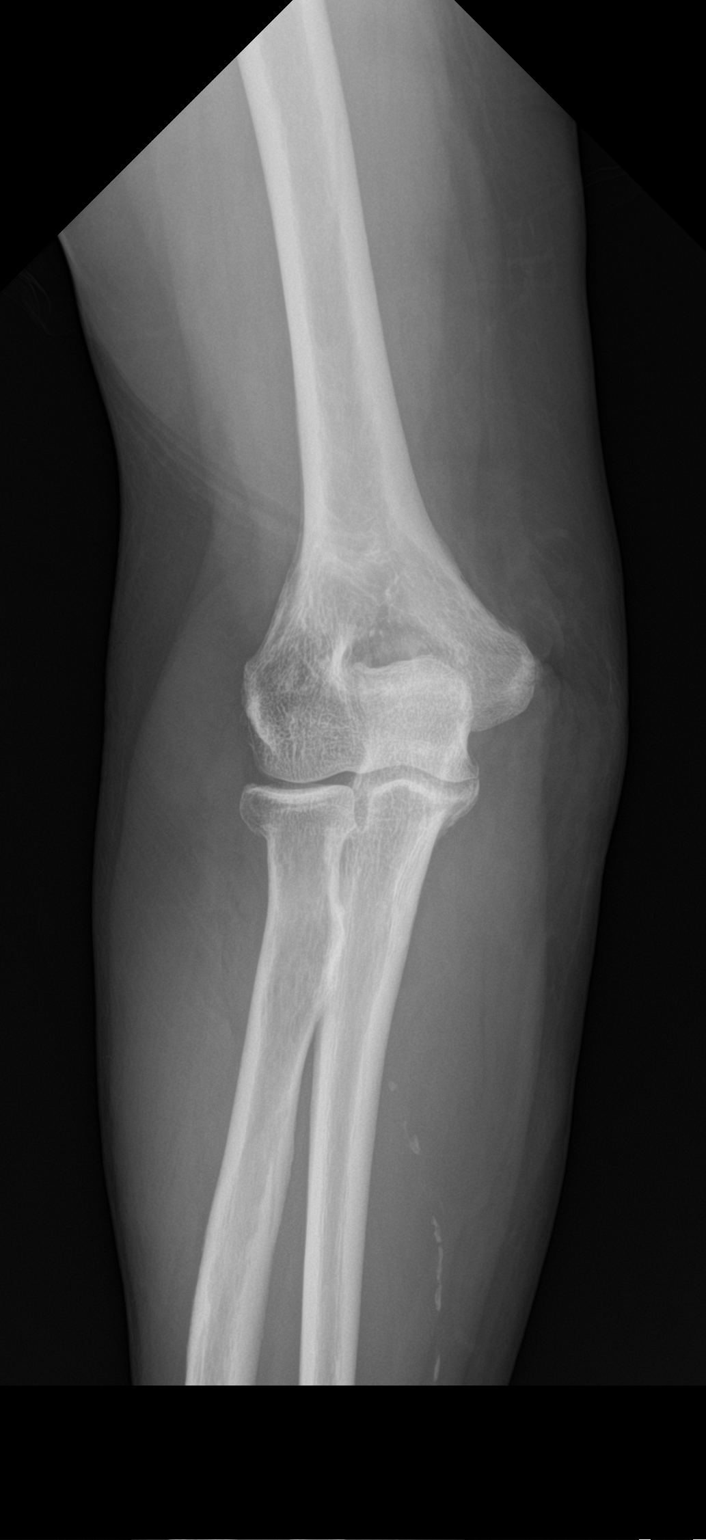
[im 3/4]
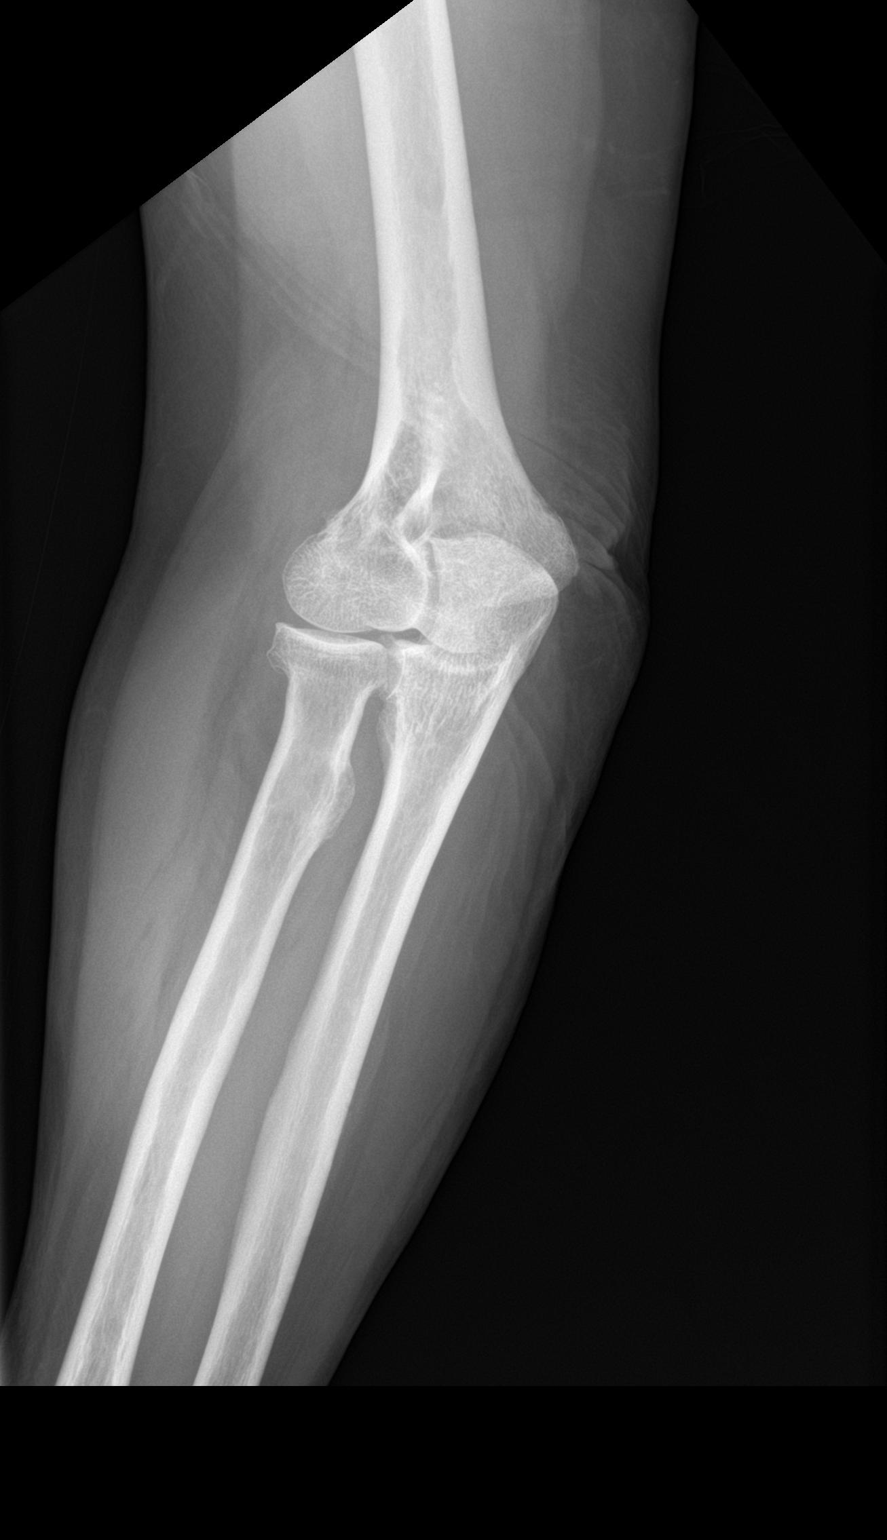
[im 4/4]
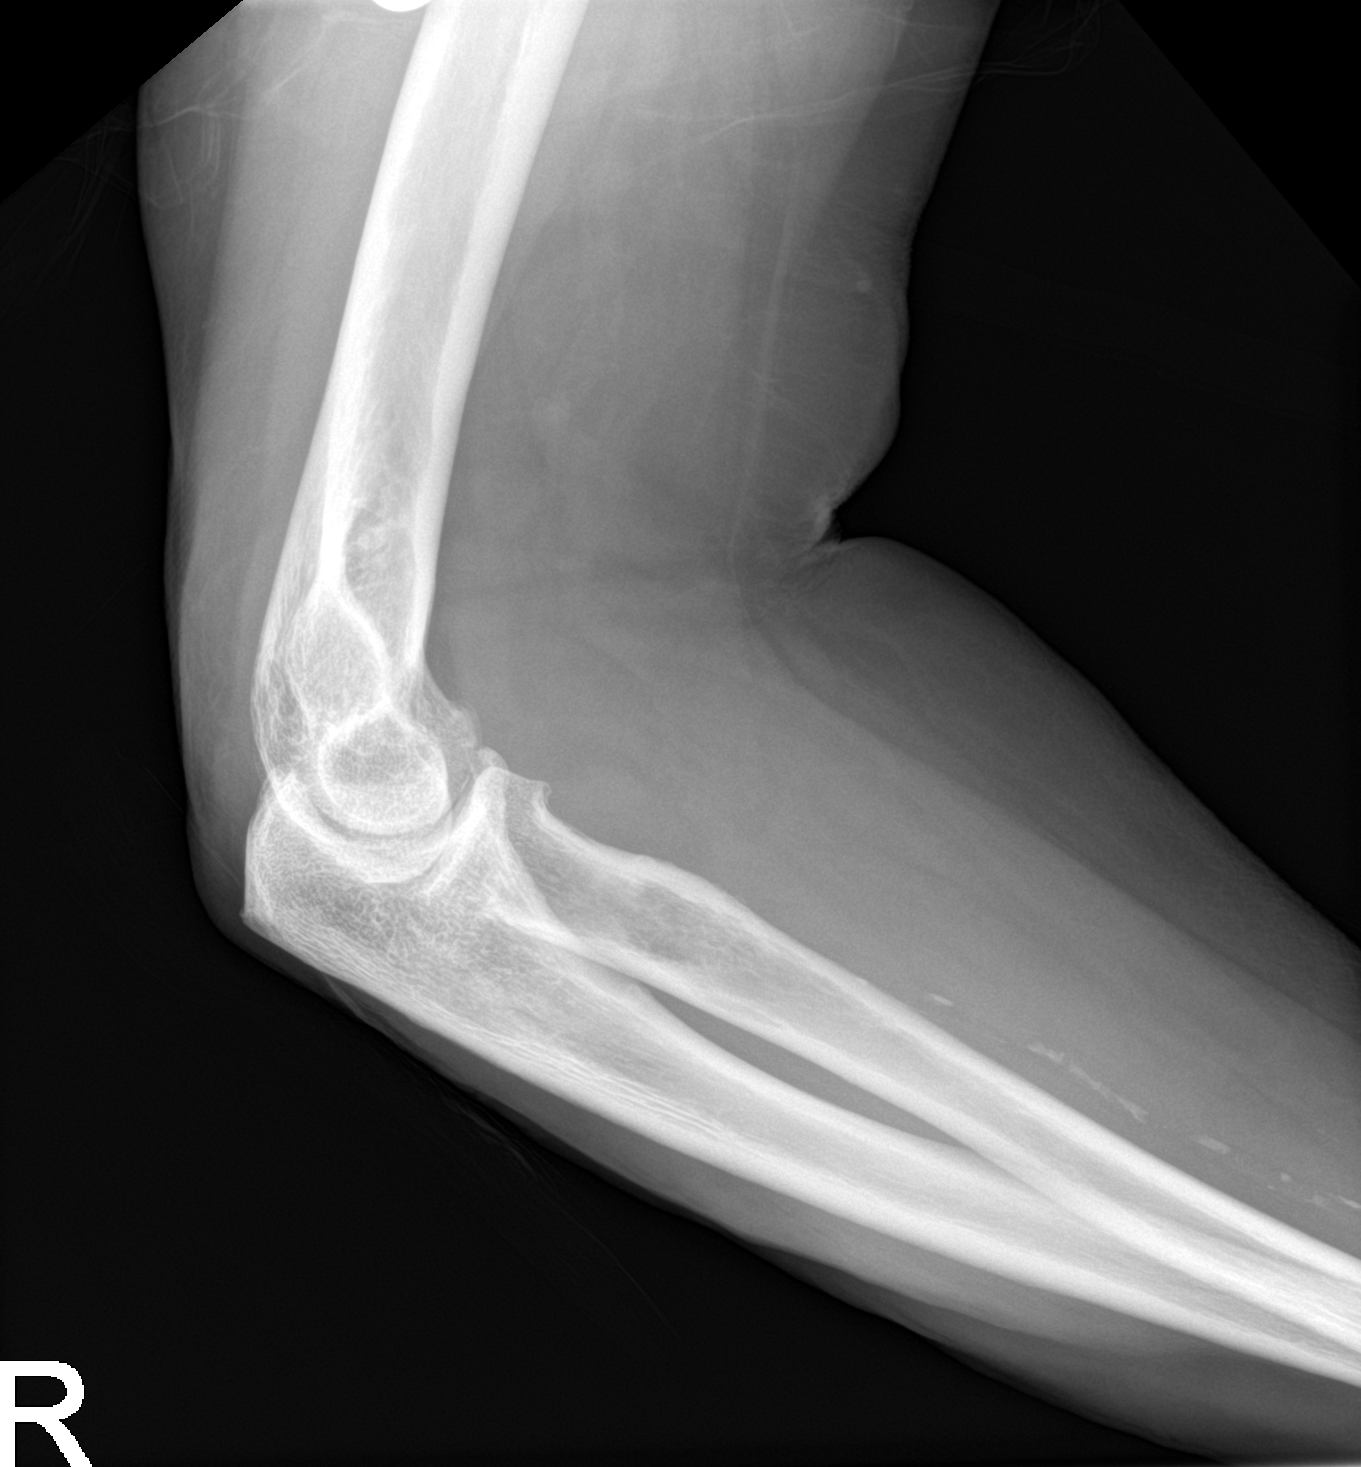

[4 of 4 positions shown; findings below may reference images not displayed]

FINDINGS: No joint effusion. No fracture or dislocation identified. Remote
healed radial head fracture.
IMPRESSION: 1. No acute findings.
2. Remote healed radial head fracture.

## 2022-01-16 IMAGING — DX DG CHEST 1V PORT
1 series · 1 of 1 positions shown · non-contrast
Comparison: 01/01/2021

CLINICAL DATA: Chest pain following fall, initial encounter

EXAM:
PORTABLE CHEST 1 VIEW

[chest ap]
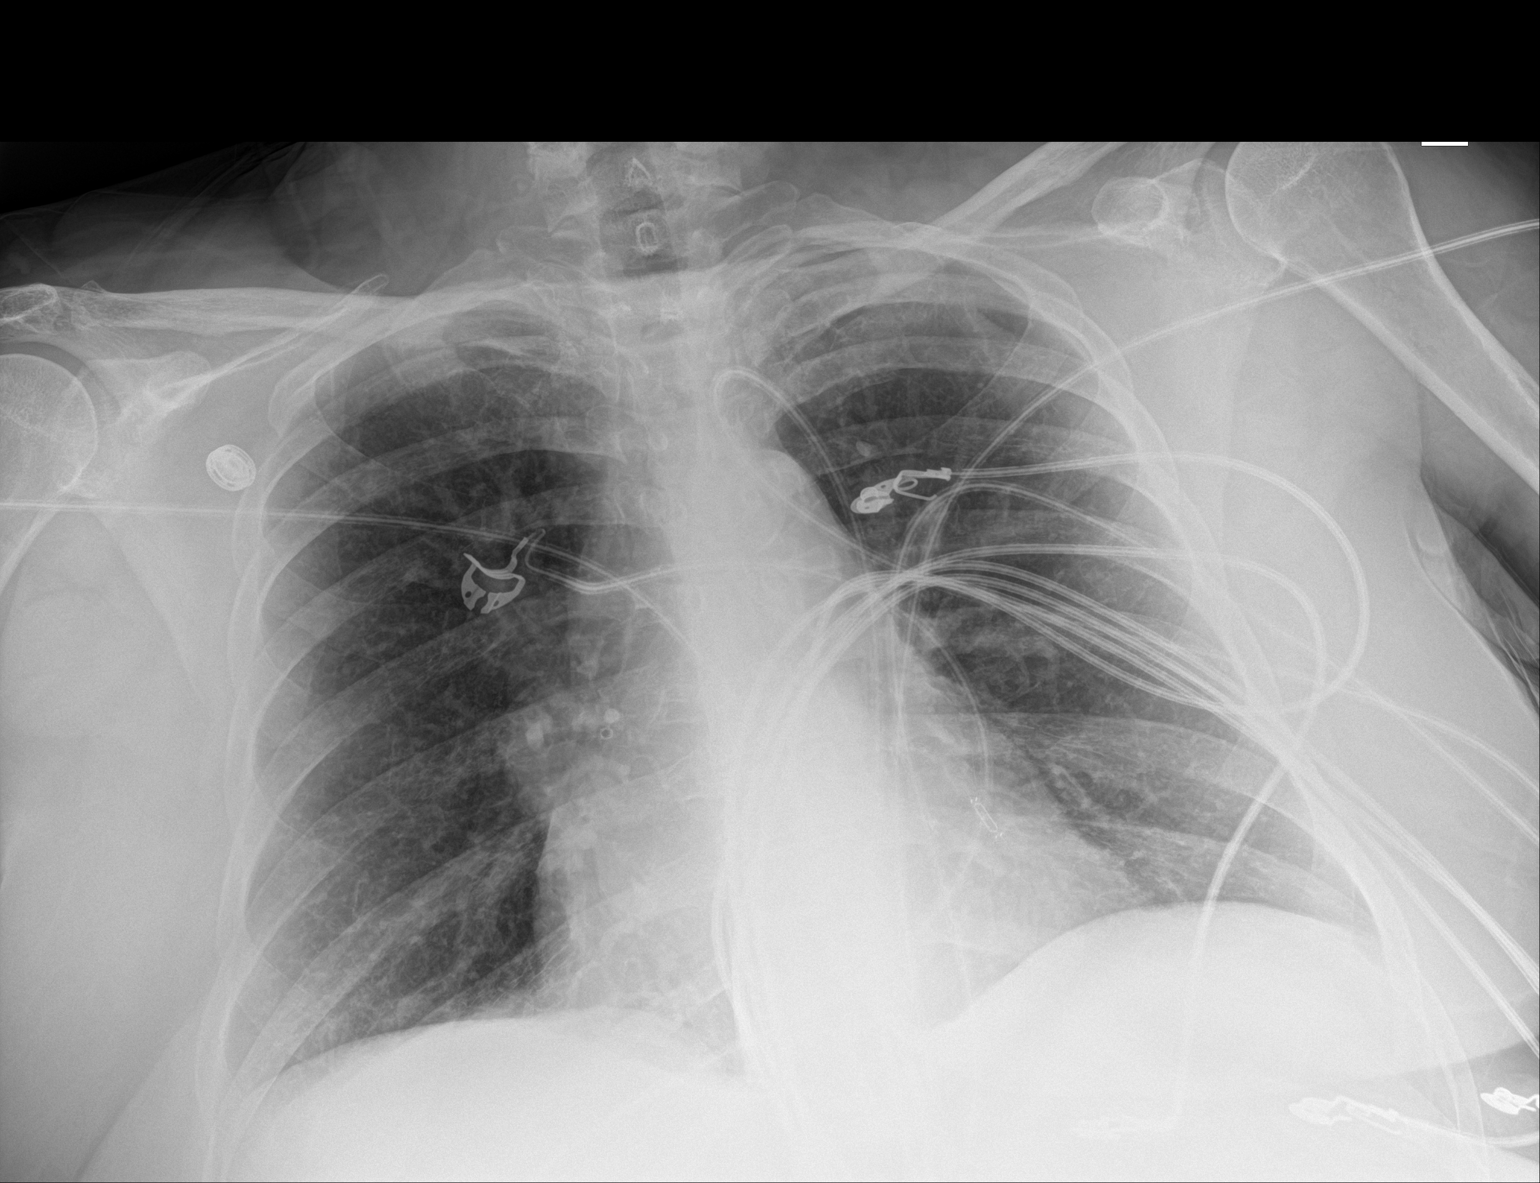

[1 of 1 positions shown; findings below may reference images not displayed]

FINDINGS: Cardiac shadow is prominent but stable. CardioMEMS device is again
identified. Lungs are clear bilaterally. No acute bony abnormality
is seen.
IMPRESSION: No acute abnormality noted.

## 2022-01-16 IMAGING — CT CT CERVICAL SPINE W/O CM
3 of 4 series · 11 of 33 positions shown, 13 images · non-contrast
Comparison: MRI head 01/02/2021, CT head 01/01/2021

CLINICAL DATA: Neck trauma. Changes injury mechanism. Status post 2
ground level falls. Hit above right eye. Hematoma. On Eliquis.

EXAM:
CT HEAD WITHOUT CONTRAST
CT CERVICAL SPINE WITHOUT CONTRAST
TECHNIQUE: Multidetector CT imaging of the head and cervical spine was
performed following the standard protocol without intravenous
contrast. Multiplanar CT image reconstructions of the cervical spine
were also generated.

[Series 8: c_spine 2.0 st · axial · 0.31mm/px · z∈[-346,-234]mm · 3 of 85 slices shown, 4 images]
[im 15/85  soft-tissue]
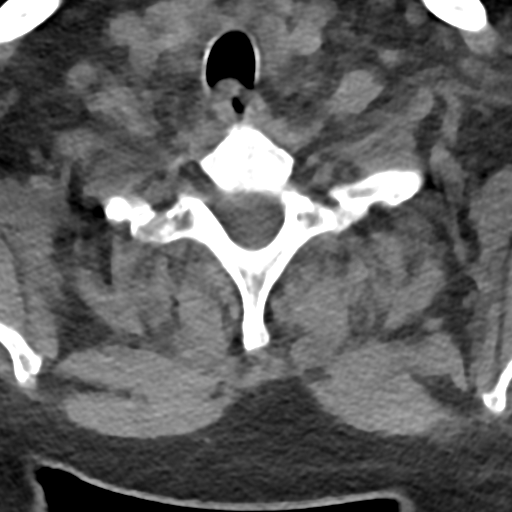
[im 15/85  bone]
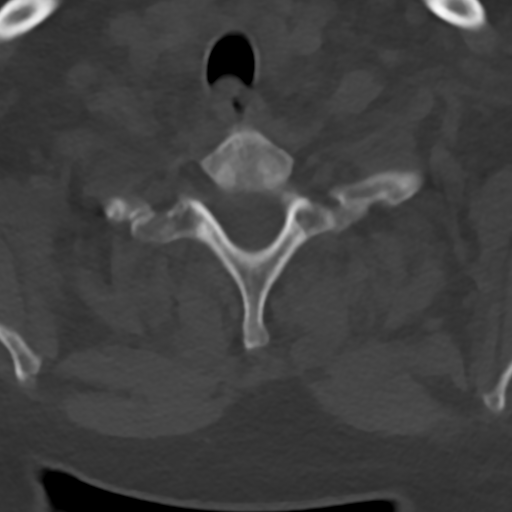
[im 43/85  bone]
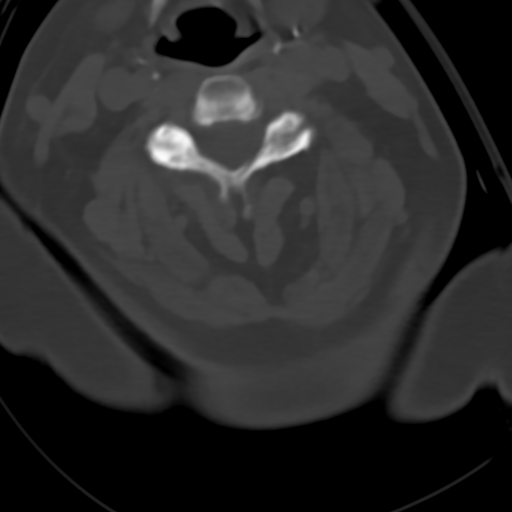
[im 71/85  bone]
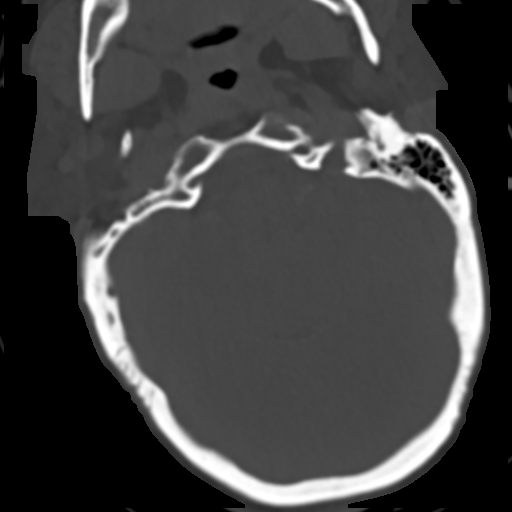

[Series 13: c_spine 2.0 sag bone · sagittal · 0.25mm/px · 5 of 61 slices shown, 6 images]
[im 21/61  bone]
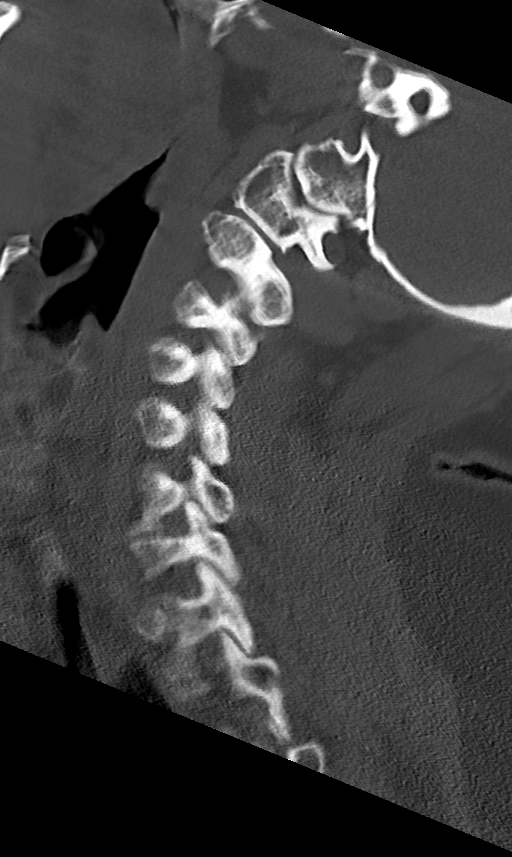
[im 26/61  bone]
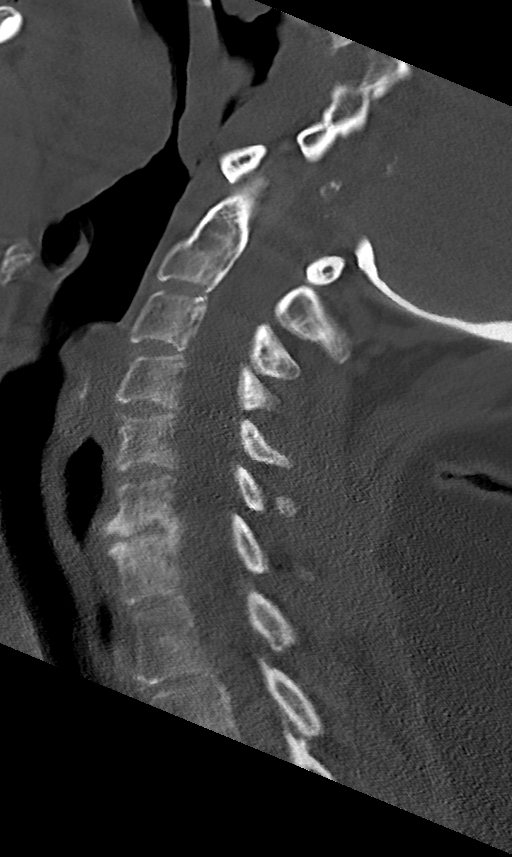
[im 31/61  soft-tissue]
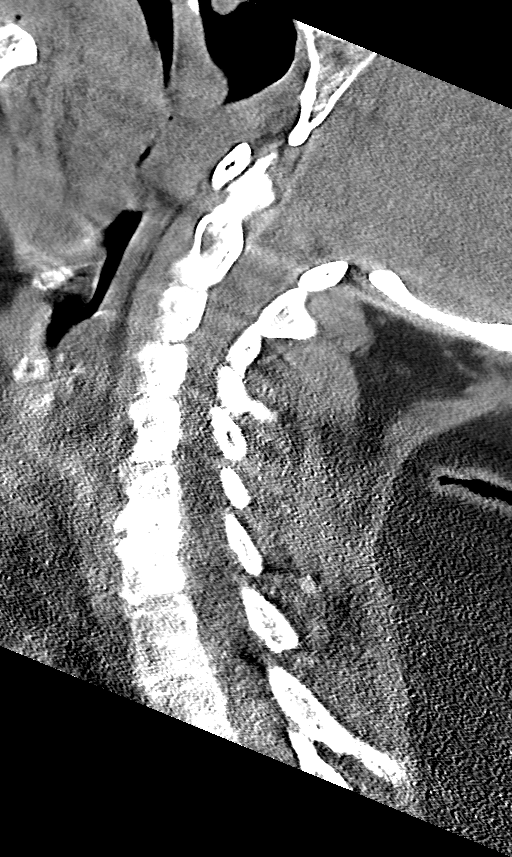
[im 31/61  bone]
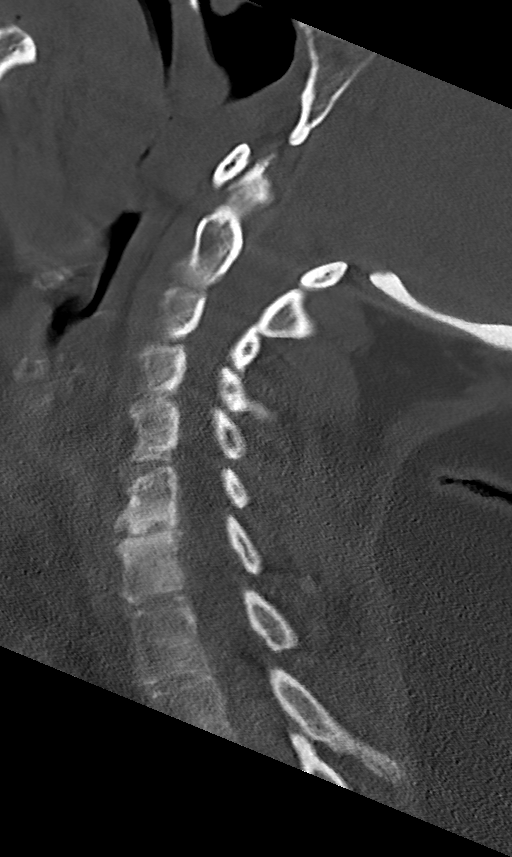
[im 36/61  bone]
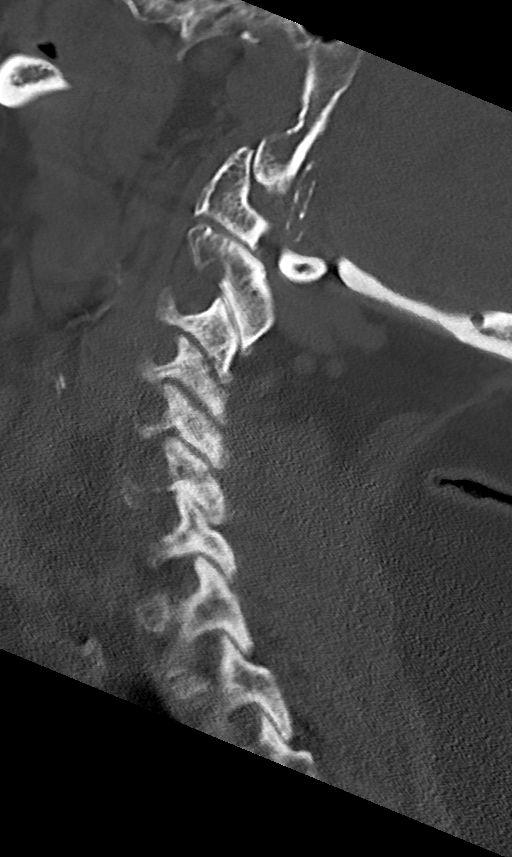
[im 41/61  bone]
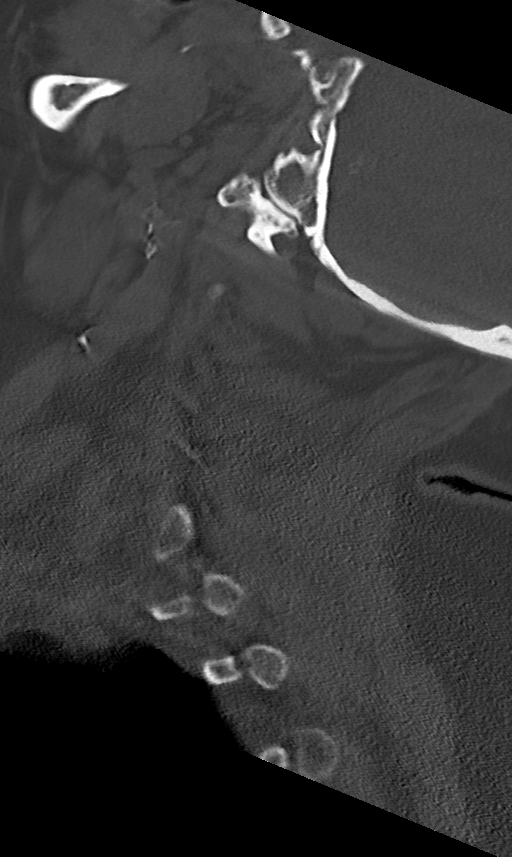

[Series 14: c_spine 2.0 cor bone · coronal · 0.25mm/px · 3 of 65 slices shown]
[im 13/65  bone]
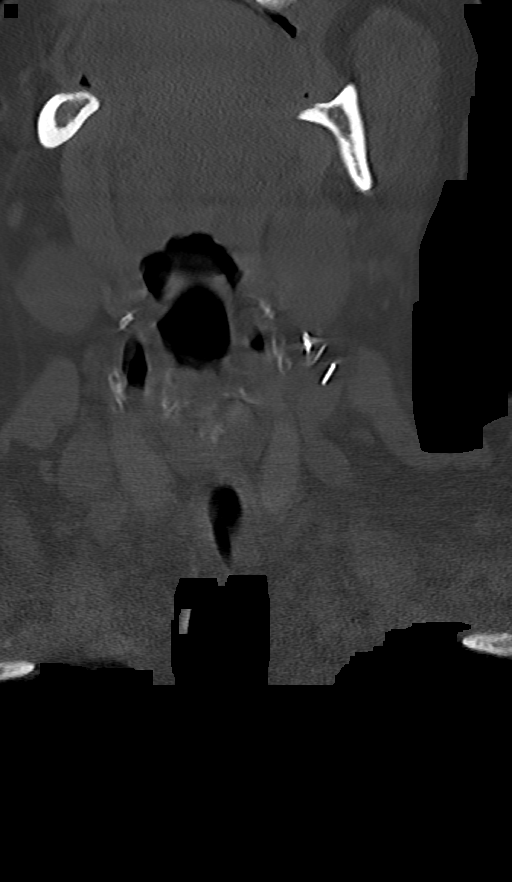
[im 26/65  bone]
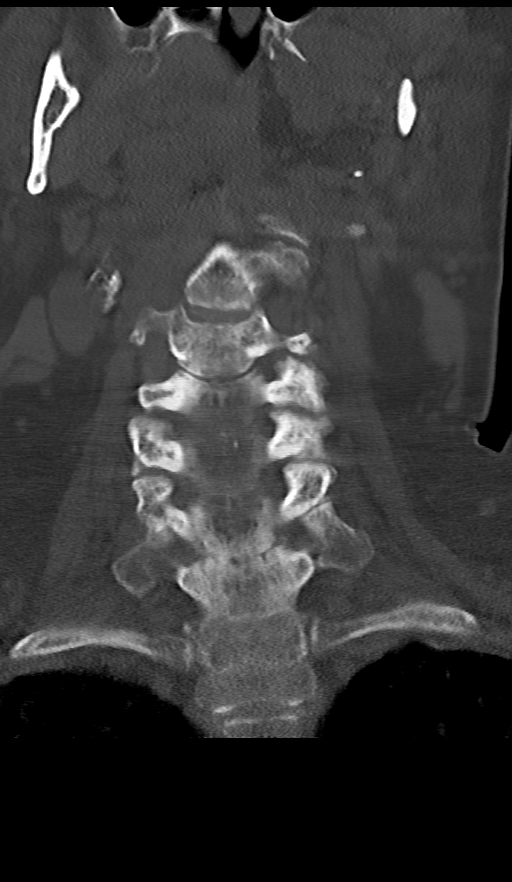
[im 39/65  bone]
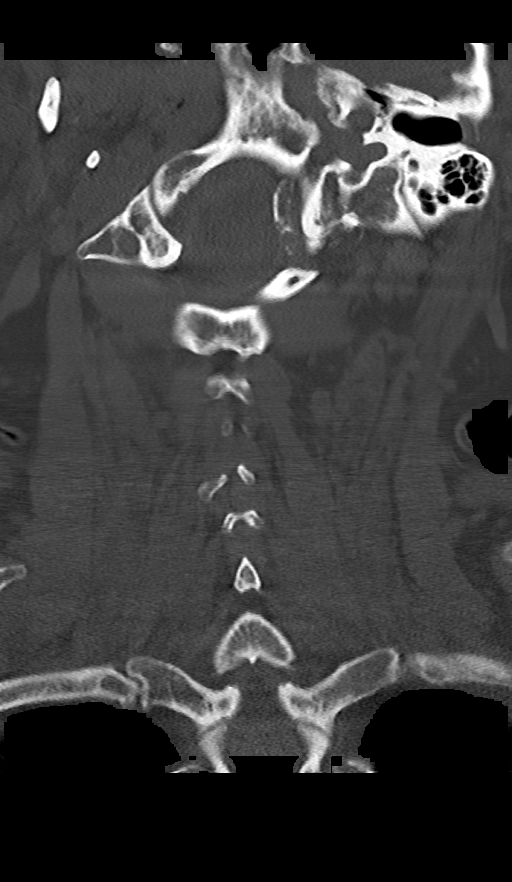

[11 of 33 positions shown; findings below may reference images not displayed]

FINDINGS: CT HEAD FINDINGS

Brain:

Cerebral ventricle sizes are concordant with the degree of cerebral
volume loss. Patchy and confluent areas of decreased attenuation are
noted throughout the deep and periventricular white matter of the
cerebral hemispheres bilaterally, compatible with chronic
microvascular ischemic disease.

No evidence of large-territorial acute infarction. No parenchymal
hemorrhage. No mass lesion. No extra-axial collection. Streak
artifact along the right temporal lobe.

No mass effect or midline shift. No hydrocephalus. Basilar cisterns
are patent.

Vascular: No hyperdense vessel. Atherosclerotic calcifications are
present within the cavernous internal carotid and vertebral
arteries.

Skull: No acute fracture or focal lesion.

Sinuses/Orbits: Paranasal sinuses and mastoid air cells are clear.
The orbits are unremarkable.

Other: Right frontal scalp small hematoma formation.

CT CERVICAL SPINE FINDINGS

Alignment: Normal.

Skull base and vertebrae: Multilevel mild-to-moderate degenerative
changes of the spine that is most prominent at the C6-C7 level.
Slightly limited evaluation of the mid to lower cervical spine due
to quantum mottle artifact. No acute fracture. No aggressive
appearing focal osseous lesion or focal pathologic process.

Soft tissues and spinal canal: No prevertebral fluid or swelling. No
visible canal hematoma.

Upper chest: Unremarkable.

Other: None.
IMPRESSION: 1. No acute intracranial abnormality.
2. No acute displaced fracture or traumatic listhesis of the
cervical spine.

## 2022-01-16 IMAGING — DX DG KNEE 1-2V PORT*R*
1 series · 3 of 3 positions shown · non-contrast
Comparison: None.

CLINICAL DATA: Status post fall.

EXAM:
PORTABLE RIGHT KNEE - 1-2 VIEW

[Series 1: knee · 0.14mm/px · 3 of 3 slices shown]
[im 1/3]
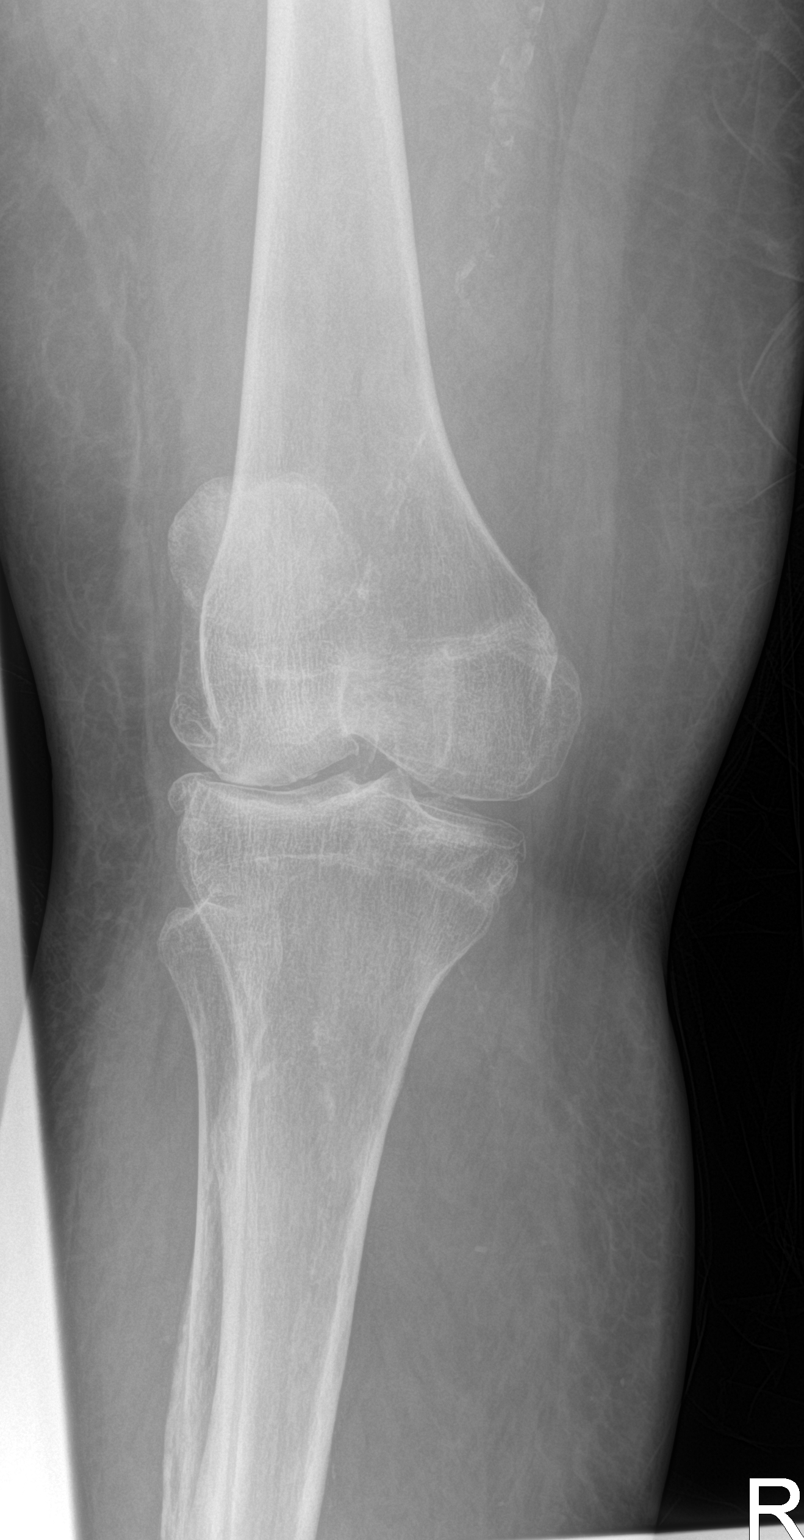
[im 2/3]
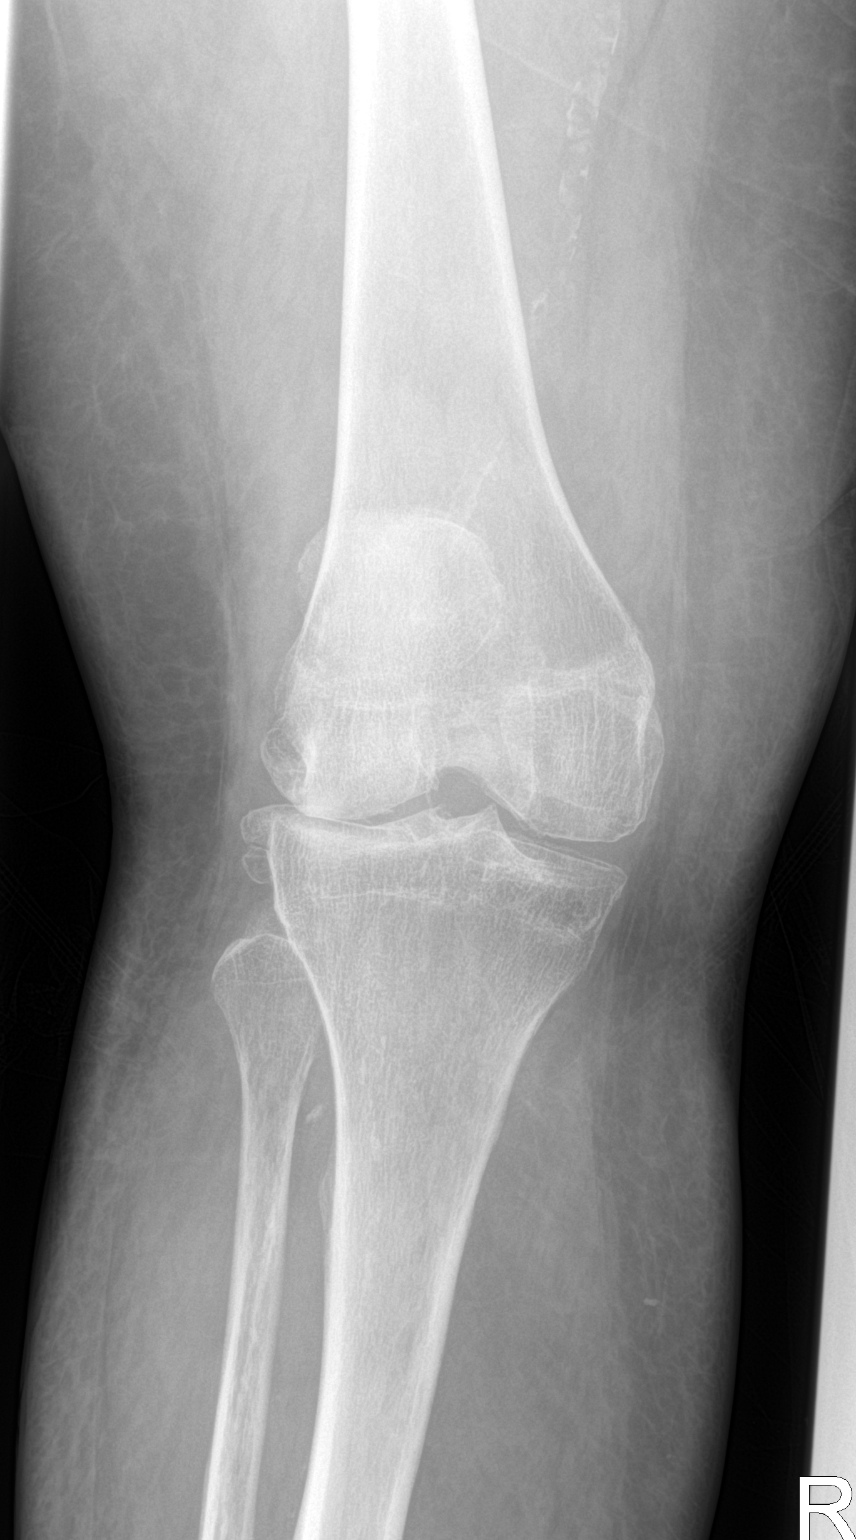
[im 3/3]
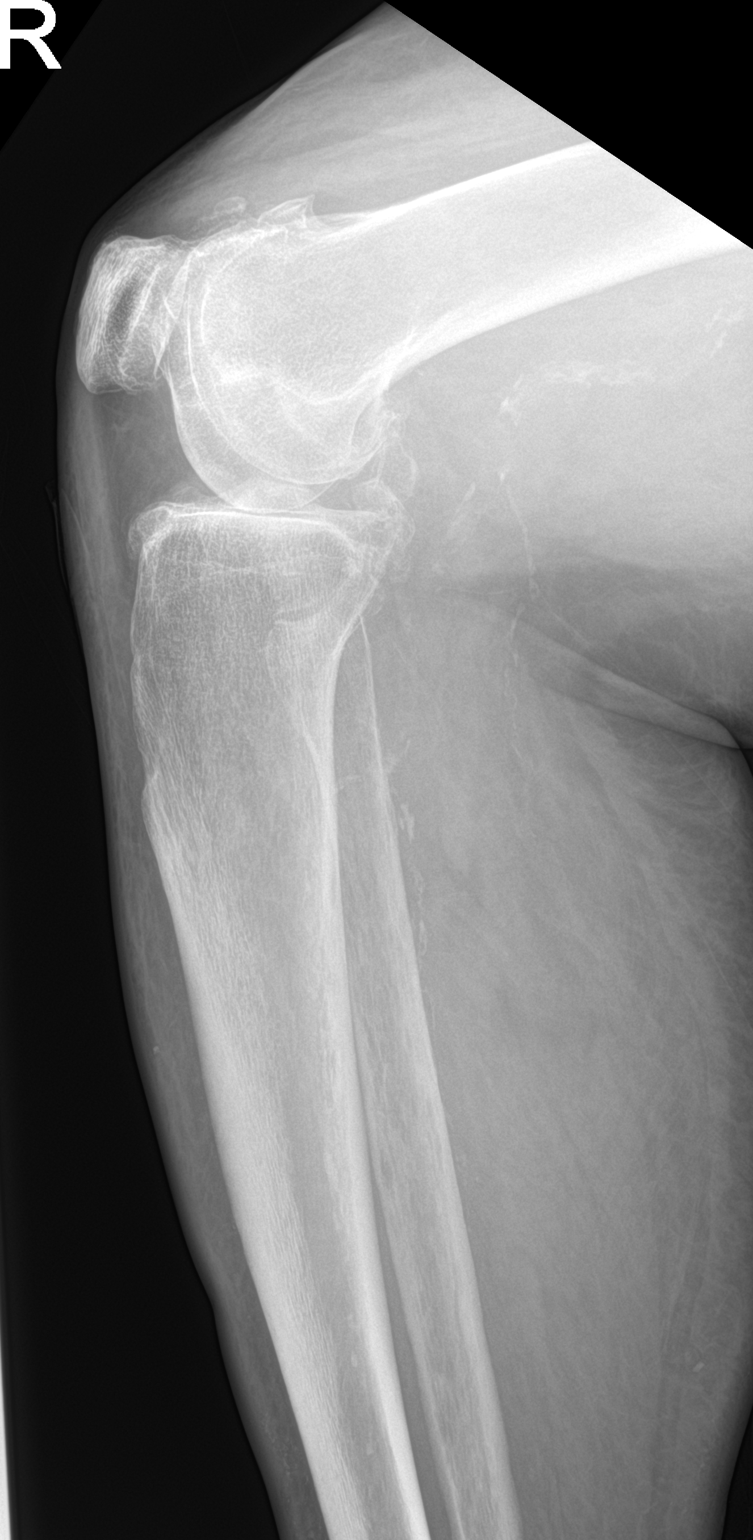

[3 of 3 positions shown; findings below may reference images not displayed]

FINDINGS: The bones appear osteopenic. Diffuse soft tissue swelling. No joint
effusion identified. Moderate tricompartment joint space narrowing
and marginal spur formation compatible with degenerative joint
disease. Several loose bodies noted within the posterior joint
space.
IMPRESSION: 1. No acute findings.
2. Moderate tricompartment degenerative joint disease.
3. Diffuse soft tissue swelling.

## 2022-01-18 ENCOUNTER — Other Ambulatory Visit (HOSPITAL_COMMUNITY): Payer: Self-pay

## 2022-01-18 ENCOUNTER — Telehealth (HOSPITAL_COMMUNITY): Payer: Self-pay | Admitting: *Deleted

## 2022-01-18 DIAGNOSIS — I5042 Chronic combined systolic (congestive) and diastolic (congestive) heart failure: Secondary | ICD-10-CM

## 2022-01-18 MED ORDER — TORSEMIDE 20 MG PO TABS: 60.0000 mg | ORAL_TABLET | Freq: Two times a day (BID) | ORAL | 6 refills | Status: DC

## 2022-01-18 NOTE — Progress Notes (Signed)
Paramedicine Encounter    Patient ID: [name], female    DOB: 05-05-1961, 61 y.o.   MRN: [id number]  Pt reports she is doing ok, she reports she feels under the weather but she thinks its due to her not sleeping well at night time, she does sleep a lot during the day.  She now has all her bubble packs for 3 more wks.  I checked tablets to ensure its all correct- her torsemide is added in bubble packs as 80mg  BID-it should be 60mg  BID. Will get her to remove the extra torsemide tablet.  It looks like the clinic wanted her to stop the plavix.  Will change that as well-  That was added in a lab note and not changed in epic list due to them not reaching her and she also didn't f/u on labs that were needed.  Sent message to update med list and get her sch for labs this week if possible.  Will relay to pt once I hear back.  I moved her bubble packs back to pill boxes for now to start the change in plavix and to correct the bubble pack dosing of her torsemide.   Not weighing daily.  But she reports that she has been compliant with her cardiomems.  She did take meds today.  Has not used the cardiomems yet today.   BP (!) 146/76    Pulse 88    Resp 20    LMP  (LMP Unknown)    SpO2 96%  Weight yesterday-? Last visit weight-224 @ office   208 at our last visit   Patient Care Team: Charlott Rakes, MD as PCP - General (Family Medicine) [name], MD as PCP - Cardiology (Cardiology) [name], MD as PCP - Electrophysiology (Cardiology) [name], MD as Consulting Physician (Cardiology) [name], MD as Consulting Physician (Pulmonary Disease) System, Provider Not In  Patient Active Problem List   Diagnosis Date Noted   Hyperlipidemia associated with type 2 diabetes mellitus ([place]) 07/29/2021   (HFpEF) heart failure with preserved ejection fraction ([place]) 07/29/2021   Bradycardia 06/13/2021   Uncontrolled type 2 diabetes mellitus with hyperglycemia ([place])  06/13/2021   Acute kidney injury superimposed on CKD ([place]) 06/13/2021   Critical ischemia of foot ([place]) 06/07/2021   Fall 05/05/2021   PAF (paroxysmal atrial fibrillation) ([place]) 05/05/2021   COPD (chronic obstructive pulmonary disease) ([place]) 05/05/2021   DM (diabetes mellitus), type 2 with complications ([place]) 40/98/1191   PAD (peripheral artery disease) ([place]) 05/05/2021   Cellulitis 05/05/2021   Acute on chronic combined systolic and diastolic CHF (congestive heart failure) ([place]) 05/05/2021   OSA (obstructive sleep apnea) 05/05/2021   CKD (chronic kidney disease) stage 3, GFR 30-59 ml/min ([place]) 05/05/2021   AKI (acute kidney injury) ([place]) 05/04/2021   Pressure injury of skin 05/04/2021   Ulcer of foot, unspecified laterality, limited to breakdown of skin ([place]) 02/17/2021   Altered mental status 01/02/2021   AF (paroxysmal atrial fibrillation) ([place]) 01/02/2021   CAD (coronary artery disease) 01/02/2021   PVD (peripheral vascular disease) ([place]) 01/02/2021   Acute renal failure superimposed on stage 4 chronic kidney disease ([place]) 01/02/2021   Diabetes mellitus type 2, uncontrolled, with complications 47/82/9562   Diabetic nephropathy ([place]) 01/02/2021   Low back pain 06/01/2020   Hyperlipidemia 04/03/2020   Muscle weakness 03/20/2020   Closed fracture of neck of right radius 03/13/2020   Pain in elbow 03/12/2020   PAD (peripheral artery disease) ([place])  12/26/2019   Acute renal failure ([name])    Acute respiratory failure with hypoxia ([place]) 12/02/2019   Acute exacerbation of CHF (congestive heart failure) ([place]) 12/01/2019   Cellulitis in diabetic foot ([place]) 07/08/2019   Osteomyelitis ([place]) 06/21/2019   NICM (nonischemic cardiomyopathy) ([place]) 06/20/2019   Non-healing ulcer ([place]) 06/20/2019   Acute CHF (congestive heart failure) ([place]) 11/26/2018   CHF (congestive heart failure) ([place]) 11/26/2018   Acute respiratory failure ([place]) 10/21/2018   AKI (acute kidney injury) ([place]) 08/18/2018    Coagulation disorder ([place]) 08/09/2017   Depression 07/21/2017   At risk for adverse drug reaction 06/20/2017   Peripheral neuropathy 06/20/2017   Acute osteomyelitis of right foot ([place]) 06/13/2017   S/P transmetatarsal amputation of foot, right ([place]) 06/05/2017   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 ([place]) 05/19/2017   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[place])    SIRS (systemic inflammatory response syndrome) ([place]) 04/06/2017   CKD (chronic kidney disease), stage III ([place]) 11/24/2016   Suspected sleep apnea 11/24/2016   Ulcer of toe of right foot, with necrosis of bone ([place]) 10/27/2016   Ulcer of left lower leg ([place]) 05/19/2016   Severe obesity (BMI >= 40) ([place]) 02/24/2016   COPD GOLD 0  02/24/2016   Morbid obesity ([place]) 02/24/2016   Encounter for therapeutic drug monitoring 02/10/2016   Symptomatic bradycardia 01/12/2016   Hypertension associated with diabetes ([place]) 12/22/2015   Chronic combined systolic and diastolic CHF (congestive heart failure) ([place])    [place]    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 11/08/2015   Tobacco abuse 10/23/2015   Coronary artery disease    DOE (dyspnea on exertion) 04/29/2015   Diabetes mellitus type 2, uncontrolled 02/08/2015   Influenza A 02/07/2015   Wrist fracture, left, with routine healing, subsequent encounter 02/05/2015   Wrist fracture, left, closed, initial encounter 01/29/2015   PAF (paroxysmal atrial fibrillation) ([place]) 01/16/2015   Carotid arterial disease ([place]) 01/16/2015   Claudication ([place]) 01/15/2015   Demand ischemia ([place]) 10/29/2014   Insomnia 02/03/2014   COPD with acute exacerbation ([place]) 02/01/2014   S/P peripheral artery angioplasty - TurboHawk atherectomy; R SFA 09/11/2013    Class: Acute   Leg pain, bilateral 08/19/2013   Hypothyroidism 07/31/2013   Cellulitis 06/13/2013   History of cocaine abuse ([place]) 06/13/2013   Long term current use of anticoagulant therapy 05/20/2013   Alcohol  abuse    Narcotic abuse ([place])    Marijuana abuse    Alcoholic cirrhosis ([place])    DM (diabetes mellitus), type 2 with peripheral vascular complications ([name])     Current Outpatient Medications:    ACCU-CHEK GUIDE test strip, USE TO CHECK BLOOD SUGAR FOUR TIMES DAILY, Disp: , Rfl:    acetaminophen (TYLENOL) 500 MG tablet, Take 500 mg by mouth every 6 (six) hours as needed for moderate pain., Disp: , Rfl:    albuterol (VENTOLIN HFA) 108 (90 Base) MCG/ACT inhaler, Inhale 2 puffs into the lungs every 6 (six) hours as needed for wheezing or shortness of breath., Disp: 18 g, Rfl: 3   allopurinol (ZYLOPRIM) 100 MG tablet, Take 1 tablet (100 mg total) by mouth daily., Disp: 30 tablet, Rfl: 6   amiodarone (PACERONE) 200 MG tablet, TAKE 1/2 TABLET (100 MG TOTAL) BY MOUTH DAILY., Disp: 15 tablet, Rfl: 3   amLODipine (NORVASC) 10 MG tablet, Take 1 tablet (10 mg total) by mouth daily., Disp: 90 tablet, Rfl: 3   apixaban (ELIQUIS) 5 MG TABS tablet, Take 1 tablet (5  mg total) by mouth 2 (two) times daily., Disp: 60 tablet, Rfl: 8   atorvastatin (LIPITOR) 40 MG tablet, Take 1 tablet (40 mg total) by mouth every evening., Disp: 90 tablet, Rfl: 1   budesonide-formoterol (SYMBICORT) 160-4.5 MCG/ACT inhaler, Inhale 2 puffs into the lungs 2 (two) times daily., Disp: 1 Inhaler, Rfl: 2   buPROPion (WELLBUTRIN SR) 150 MG 12 hr tablet, TAKE 1 TABLET BY MOUTH DAILY FOR 3 DAYS THEN INCREASE TO 1 TABLET BY MOUTH 2 TIMES DAILY., Disp: 60 tablet, Rfl: 2   colchicine 0.6 MG tablet, TAKE 2 TABS (1.2 MG) AT THE ONSET OF A GOUT FLARE, MAY TAKE 1 TAB (0.6 MG) IN 1 HOUR IF SYMPTOMS (Patient taking differently: daily. Take 2 tabs (1.2 mg) at the onset of a gout flare, may take 1 tab (0.6 mg) in 1 hour if symptoms), Disp: 30 tablet, Rfl: 3   Continuous Blood Gluc Sensor (FREESTYLE LIBRE 2 SENSOR) MISC, USE 1 (ONE) EACH EVERY 2 WEEKS, Disp: 2 each, Rfl: 11   Dulaglutide (TRULICITY) 1.5 WG/9.5AO SOPN, Inject 1.5 mg into the skin once a  week., Disp: 2 mL, Rfl: 6   empagliflozin (JARDIANCE) 10 MG TABS tablet, Take 1 tablet (10 mg total) by mouth daily before breakfast., Disp: 90 tablet, Rfl: 3   ferrous sulfate 324 MG TBEC, Take 1 tablet (324 mg total) by mouth 2 (two) times daily as needed., Disp: 60 tablet, Rfl: 3   FLUoxetine (PROZAC) 20 MG capsule, TAKE 1 CAPSULE (20 MG TOTAL) BY MOUTH AT BEDTIME., Disp: 90 capsule, Rfl: 0   folic acid (FOLVITE) 1 MG tablet, TAKE 1 TABLET (1 MG TOTAL) BY MOUTH DAILY., Disp: 90 tablet, Rfl: 1   hydrALAZINE (APRESOLINE) 50 MG tablet, Take 1 tablet (50 mg total) by mouth 3 (three) times daily., Disp: 90 tablet, Rfl: 6   insulin glargine (LANTUS) 100 UNIT/ML Solostar Pen, Inject 30 Units into the skin daily., Disp: 30 mL, Rfl: 6   insulin lispro (HUMALOG) 100 UNIT/ML KwikPen, Inject 8 Units into the skin 3 (three) times daily with meals., Disp: 15 mL, Rfl: 0   levothyroxine (SYNTHROID) 50 MCG tablet, Take 1 tablet (50 mcg total) by mouth daily., Disp: 90 tablet, Rfl: 1   losartan (COZAAR) 25 MG tablet, Take 2 tablets (50 mg total) by mouth daily. (Patient taking differently: Take 50 mg by mouth at bedtime.), Disp: 180 tablet, Rfl: 0   Omega-3 Fatty Acids (FISH OIL PO), Take 1 capsule by mouth daily., Disp: , Rfl:    ondansetron (ZOFRAN) 4 MG tablet, Take 1 tablet (4 mg total) by mouth every 8 (eight) hours as needed for nausea or vomiting., Disp: 20 tablet, Rfl: 0   pantoprazole (PROTONIX) 40 MG tablet, TAKE 1 TABLET (40 MG TOTAL) BY MOUTH DAILY., Disp: 90 tablet, Rfl: 0   potassium chloride SA (KLOR-CON M20) 20 MEQ tablet, Take 1 tablet (20 mEq total) by mouth daily., Disp: 90 tablet, Rfl: 3   VITAMIN D PO, Take 1 tablet by mouth daily., Disp: , Rfl:    HYDROcodone-acetaminophen (NORCO/VICODIN) 5-325 MG tablet, Take 1 tablet by mouth every 4 (four) hours as needed. (Patient not taking: Reported on 12/09/2021), Disp: 10 tablet, Rfl: 0   Misc. Devices MISC, 2 L oxygen continuously via nasal cannula.   Diagnosis chronic respiratory failure with hypoxia (Patient not taking: Reported on 12/03/2021), Disp: 1 each, Rfl: 0   torsemide (DEMADEX) 20 MG tablet, Take 3 tablets (60 mg total) by mouth 2 (two) times daily.,  Disp: 180 tablet, Rfl: 6 Allergies  Allergen Reactions   Gabapentin Nausea And Vomiting and Other (See Comments)    POSSIBLE SHAKING   Lyrica [Pregabalin] Other (See Comments)    Shaking       Social History   Socioeconomic History   Marital status: Single    Spouse name: Not on file   Number of children: 1   Years of education: 12   Highest education level: 12th grade  Occupational History   Occupation: disabled  Tobacco Use   Smoking status: Former    Packs/day: 1.00    Years: 44.00    Pack years: 44.00    Types: E-cigarettes, Cigarettes   Smokeless tobacco: Former    Types: Snuff  Vaping Use   Vaping Use: Former   Devices: 11/26/2018 "stopped months ago"  Substance and Sexual Activity   Alcohol use: Yes    Comment: occ   Drug use: Yes    Types: "Crack" cocaine, Marijuana, Oxycodone   Sexual activity: Not Currently  Other Topics Concern   Not on file  Social History Narrative   ** Merged History Encounter **       Lives in [place], in motel with sister.  They are looking to move but don't have a place to go yet.     Social Determinants of Health   Financial Resource Strain: Not on file  Food Insecurity: Not on file  Transportation Needs: Not on file  Physical Activity: Not on file  Stress: Not on file  Social Connections: Not on file  Intimate Partner Violence: Not on file    Physical Exam      Future Appointments  Date Time Provider [place]  01/21/2022 11:45 AM [facility] LAB [facility] None  01/31/2022  1:30 PM [facility] PA/NP [facility] None  04/04/2022  2:00 PM Larey Dresser, MD [facility] None  01/11/2023  9:00 AM [facility] NL VASC 3 [facility] [place], [place] [facility] Paramedic  01/19/22

## 2022-01-18 NOTE — Telephone Encounter (Signed)
Pt sch for repeat labs this week (they were supposed to be repeated in early Feb), refill for torsemide sent in, pt is aware to stop Plavix and it will be removed from pill box. ?

## 2022-01-21 ENCOUNTER — Ambulatory Visit (HOSPITAL_COMMUNITY)
Admission: RE | Admit: 2022-01-21 | Discharge: 2022-01-21 | Disposition: A | Discharge status: Home / Self Care | Payer: Medicare Other | Source: Ambulatory Visit | Attending: Cardiology | Admitting: Cardiology

## 2022-01-21 ENCOUNTER — Other Ambulatory Visit: Payer: Self-pay

## 2022-01-21 DIAGNOSIS — I5042 Chronic combined systolic (congestive) and diastolic (congestive) heart failure: Secondary | ICD-10-CM | POA: Diagnosis not present

## 2022-01-21 LAB — BASIC METABOLIC PANEL
Anion gap: 12 (ref 5–15)
BUN: 48 mg/dL — ABNORMAL HIGH (ref 6–20)
CO2: 29 mmol/L (ref 22–32)
Calcium: 8.9 mg/dL (ref 8.9–10.3)
Chloride: 95 mmol/L — ABNORMAL LOW (ref 98–111)
Creatinine, Ser: 1.82 mg/dL — ABNORMAL HIGH (ref 0.44–1.00)
GFR, Estimated: 32 mL/min — ABNORMAL LOW (ref >60)
Glucose, Bld: 302 mg/dL — ABNORMAL HIGH (ref 70–99)
Potassium: 4.3 mmol/L (ref 3.5–5.1)
Sodium: 136 mmol/L (ref 135–145)

## 2022-01-31 ENCOUNTER — Other Ambulatory Visit: Payer: Self-pay

## 2022-01-31 ENCOUNTER — Ambulatory Visit (HOSPITAL_COMMUNITY)
Admission: RE | Admit: 2022-01-31 | Discharge: 2022-01-31 | Disposition: A | Discharge status: Home / Self Care | Payer: Medicare Other | Source: Ambulatory Visit | Attending: Family Medicine | Admitting: Family Medicine

## 2022-01-31 ENCOUNTER — Encounter (HOSPITAL_COMMUNITY): Payer: Self-pay

## 2022-01-31 VITALS — BP 134/62 | HR 86 | Wt 218.8 lb

## 2022-01-31 DIAGNOSIS — E1122 Type 2 diabetes mellitus with diabetic chronic kidney disease: Secondary | ICD-10-CM | POA: Insufficient documentation

## 2022-01-31 DIAGNOSIS — G4733 Obstructive sleep apnea (adult) (pediatric): Secondary | ICD-10-CM | POA: Diagnosis not present

## 2022-01-31 DIAGNOSIS — I5042 Chronic combined systolic (congestive) and diastolic (congestive) heart failure: Secondary | ICD-10-CM | POA: Insufficient documentation

## 2022-01-31 DIAGNOSIS — I5022 Chronic systolic (congestive) heart failure: Secondary | ICD-10-CM

## 2022-01-31 DIAGNOSIS — I110 Hypertensive heart disease with heart failure: Secondary | ICD-10-CM | POA: Diagnosis not present

## 2022-01-31 DIAGNOSIS — Z79890 Hormone replacement therapy (postmenopausal): Secondary | ICD-10-CM | POA: Diagnosis not present

## 2022-01-31 DIAGNOSIS — I130 Hypertensive heart and chronic kidney disease with heart failure and stage 1 through stage 4 chronic kidney disease, or unspecified chronic kidney disease: Secondary | ICD-10-CM | POA: Diagnosis not present

## 2022-01-31 DIAGNOSIS — I739 Peripheral vascular disease, unspecified: Secondary | ICD-10-CM

## 2022-01-31 DIAGNOSIS — Z79899 Other long term (current) drug therapy: Secondary | ICD-10-CM | POA: Diagnosis not present

## 2022-01-31 DIAGNOSIS — I10 Essential (primary) hypertension: Secondary | ICD-10-CM | POA: Diagnosis not present

## 2022-01-31 DIAGNOSIS — I480 Paroxysmal atrial fibrillation: Secondary | ICD-10-CM

## 2022-01-31 DIAGNOSIS — K7460 Unspecified cirrhosis of liver: Secondary | ICD-10-CM | POA: Diagnosis not present

## 2022-01-31 DIAGNOSIS — I2510 Atherosclerotic heart disease of native coronary artery without angina pectoris: Secondary | ICD-10-CM

## 2022-01-31 DIAGNOSIS — Z8719 Personal history of other diseases of the digestive system: Secondary | ICD-10-CM | POA: Diagnosis not present

## 2022-01-31 DIAGNOSIS — F17200 Nicotine dependence, unspecified, uncomplicated: Secondary | ICD-10-CM | POA: Diagnosis not present

## 2022-01-31 DIAGNOSIS — I5032 Chronic diastolic (congestive) heart failure: Secondary | ICD-10-CM | POA: Diagnosis not present

## 2022-01-31 DIAGNOSIS — Z7984 Long term (current) use of oral hypoglycemic drugs: Secondary | ICD-10-CM | POA: Diagnosis not present

## 2022-01-31 DIAGNOSIS — N1831 Chronic kidney disease, stage 3a: Secondary | ICD-10-CM

## 2022-01-31 DIAGNOSIS — E1151 Type 2 diabetes mellitus with diabetic peripheral angiopathy without gangrene: Secondary | ICD-10-CM

## 2022-01-31 DIAGNOSIS — J449 Chronic obstructive pulmonary disease, unspecified: Secondary | ICD-10-CM

## 2022-01-31 DIAGNOSIS — Z7901 Long term (current) use of anticoagulants: Secondary | ICD-10-CM | POA: Diagnosis not present

## 2022-01-31 LAB — CBC
HCT: 34 % — ABNORMAL LOW (ref 36.0–46.0)
Hemoglobin: 11 g/dL — ABNORMAL LOW (ref 12.0–15.0)
MCH: 30.6 pg (ref 26.0–34.0)
MCHC: 32.4 g/dL (ref 30.0–36.0)
MCV: 94.4 fL (ref 80.0–100.0)
Platelets: 276 10*3/uL (ref 150–400)
RBC: 3.6 MIL/uL — ABNORMAL LOW (ref 3.87–5.11)
RDW: 14.7 % (ref 11.5–15.5)
WBC: 10.3 10*3/uL (ref 4.0–10.5)
nRBC: 0 % (ref 0.0–0.2)

## 2022-01-31 LAB — BASIC METABOLIC PANEL
Anion gap: 10 (ref 5–15)
BUN: 47 mg/dL — ABNORMAL HIGH (ref 6–20)
CO2: 25 mmol/L (ref 22–32)
Calcium: 8.4 mg/dL — ABNORMAL LOW (ref 8.9–10.3)
Chloride: 102 mmol/L (ref 98–111)
Creatinine, Ser: 1.7 mg/dL — ABNORMAL HIGH (ref 0.44–1.00)
GFR, Estimated: 34 mL/min — ABNORMAL LOW (ref >60)
Glucose, Bld: 335 mg/dL — ABNORMAL HIGH (ref 70–99)
Potassium: 4 mmol/L (ref 3.5–5.1)
Sodium: 137 mmol/L (ref 135–145)

## 2022-01-31 LAB — BRAIN NATRIURETIC PEPTIDE: B Natriuretic Peptide: 238.7 pg/mL — ABNORMAL HIGH (ref 0.0–100.0)

## 2022-01-31 MED ORDER — TORSEMIDE 20 MG PO TABS: ORAL_TABLET | ORAL | 4 refills | Status: DC

## 2022-01-31 MED ORDER — HYDRALAZINE HCL 50 MG PO TABS: 75.0000 mg | ORAL_TABLET | Freq: Three times a day (TID) | ORAL | 4 refills | Status: DC

## 2022-01-31 NOTE — Patient Instructions (Signed)
Thank you for coming in today ? ?Labs were done today, if any labs are abnormal the clinic will call you ? ?INCREASE Hydralazine to 75 mg 1 1/2 tablet, 3 times a day  ? ?INCREASE Torsemide to 80 mg 4 tablets every morning and 60 mg 3 tablets every evening  ? ?PLEASE DO A CARDIO MEMS READING TODAY 01/31/2022 ? ?Your physician recommends that you schedule a follow-up appointment in:  ?Please keep scheduled follow up with Dr. Aundra Dubin ? ?At the [facility], you and your health needs are our priority. As part of our continuing mission to provide you with exceptional heart care, we have created designated Provider Care Teams. These Care Teams include your primary Cardiologist (physician) and Advanced Practice Providers (APPs- Physician Assistants and Nurse Practitioners) who all work together to provide you with the care you need, when you need it.  ? ?You may see any of the following providers on your designated Care Team at your next follow up: ?Dr Glori Bickers ?Dr Loralie Champagne ?Darrick Grinder, NP ?Lyda Jester, PA ?Jessica Milford,NP ?Marlyce Huge, PA ?Audry Riles, PharmD ? ? ?Please be sure to bring in all your medications bottles to every appointment.  ? ?If you have any questions or concerns before your next appointment please send [place] a message through [place] or call our office at [phone].   ? ?TO LEAVE A MESSAGE FOR THE NURSE SELECT OPTION 2, PLEASE LEAVE A MESSAGE INCLUDING: ?YOUR NAME ?DATE OF BIRTH ?CALL BACK NUMBER ?REASON FOR CALL**this is important as we prioritize the call backs ? ?YOU WILL RECEIVE A CALL BACK THE SAME DAY AS LONG AS YOU CALL BEFORE 4:00 PM ? ? ?

## 2022-01-31 NOTE — Progress Notes (Signed)
ReDS Vest / Clip - 01/31/22 1400   ? ?  ? ReDS Vest / Clip  ? Station Marker B   ? Ruler Value 35   ? ReDS Value Range Moderate volume overload   ? ReDS Actual Value 38   ? ?  ?  ? ?  ? ? ?

## 2022-01-31 NOTE — Progress Notes (Signed)
Patient ID: [name], female   DOB: 1961-07-25, 61 y.o.   MRN: [id number] ? ?  ?Advanced Heart Failure Clinic Note  ? ?PCP: [place] ?PV: Dr. Gwenlyn Found ?HF Cardiology: Dr. Aundra Dubin ? ?Ms Bolding is a 61 y.o. with history of PAD, carotid stenosis s/p left CEA, relatively mild CAD, chronic systolic CHF, paroxysmal atrial fibrillation and prior substance abuse presents for CHF clinic followup.  She was admitted in 1/17 with acute hypoxemic respiratory failure in the setting of atrial fibrillation/RVR and volume overload.  She was initially intubated.  She converted back to NSR with amiodarone gtt.  She was treated with IV Lasix, steroids, bronchodilators.  She developed AKI and losartan was stopped.   ? ?She was admitted in 3/17 with symptomatic bradycardia.  She was supposed to stop diltiazem after this admission but continued to take it.  She presented later in 3/17, again with symptomatic bradycardia (junctional bradycardia), hypotension, and AKI.  Diltiazem and bisoprolol were stopped.  HR recovered and she has had no problems since.  ACEI was stopped with AKI.  ? ?In 5/18, she had peripheral angiography with atherectomy of right SFA.  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 7/18 ABIs showed 0.65 on right, 0.31 on left.  In 8/20, she had peripheral angiography showing totally occluded left SFA.  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 Donnetta Hutching.  ABIs in 2/21 were 0.44 on right, 0.99 on left.  ? ?Echo in 1/21 showed EF 50%, mild LVH, normal RV, PASP 60 mmHg.  ? ?Echo 03/08/21 EF 55-60% with basal to mid inferolateral hypokinesis.  ? ?Admitted 6/22 for mechanical fall, found to have AKI on CKD. She was given IV lasix and her losartan was held. ? ?Underwent PV angiogram with Dr. Gwenlyn Found 7/22. She had drug-coated balloon angioplasty of the right SFA after being admitted for critical ischemia of the right foot.  Repeat peripheral  arterial dopplers in 8/22 still showed severe right SFA disease.  ? ?Admitted 8/22 for bradycardia and AKI, bisoprolol subsequently stopped; amiodarone was continued. ? ?Seen in ED 9/22 for N/V w/ black tarry stools after taking Pepto-Bismal, losartan stopped for unclear reasons. ? ?Follow up 1/23 she was not volume overloaded, stable NYHA II-III symptoms.  ? ?Today she returns for HF follow up. Overall feeling fine. No significant dyspnea walking with her walker on flat ground. Denies palpitations, abnormal bleeding, CP, dizziness, edema, or PND/Orthopnea. Appetite ok. No fever or chills. Weight at home 207-215 pounds. Taking all medications. Leg ulcer has resolved. Improving compliance with Cardiomems, but has not done a reading in 1 week. Quit smoking 2 weeks ago. She is followed by paramedicine. ? ?ECG (personally reviewed): none ordered today. ? ?ReDs: 38% ? ?Cardiomems: 01/24/22 PADP 17 (goal 20). ? ?Labs (1/17): K 5.2, creatinine 1.4, AST 43, ALT normal ?Labs (2/17): K 4.2, creatinine 1.3, BNP 607, TSH normal ?Labs (3/17): K 4.2, creatinine 1.45, AST/ALT normal, HCT 28.7 ?Labs (4/17): K 4.4, creatinine 1.61 ?Labs (08/31/2016): K 4.5 Creatinine 1.43  ?Labs (11/17): K 4.2, creatinine 1.4 ?Labs (9/18): K 4.1, creatinine 1.1, TSH normal ?Labs 04/13/2018: K 5 Creatinine 1.75 ?Labs (2/21): K 4.1, creatinine 1.58 ?Labs (4/22): K 3.7, creatinine 1.55 ?Labs (5/22): K 3.8, creatinine 1.54 ?Labs (8/22): K 4.0, creatinine 2.07 ?Labs (11/22): TSH normal, LDL 59 ?Labs (12/22): K 3.6, creatinine 1.28, BNP 256, AST/ALT normal ?Labs (3/23): K 4.3, creatinine 1.82 ? ?PMH: ?  1. Carotid stenosis: Known occluded right carotid.  Left CEA in 4/16.  ?- Carotid dopplers (7/21): CTO RICA, mild disease LICA.  ?- Carotid dopplers (7/22): CTO RICA, 9-02% LICA.  ?2. CAD: LHC in 12/15 with 80% stenosis in small OM1, nonobstructive disease in other territories.  ?3. Chronic systolic CHF: Nonischemic cardiomyopathy (?due to ETOH or prior drug  abuse).  She has a Cardiomems.  ?- Echo (1/17) with EF 45%, mild LV hypertrophy, moderate diastolic dysfunction, inferolateral severe hypokinesis, mildly decreased RV systolic function.  ?- Echo (1/18): EF 40-45%, mild LVH, normal RV size and systolic function.  ?- Echo (1/21): EF 50%, mild LVH, normal RV, PASP 60 mmHg.  ?- Echo (4/22): EF 55-60%, basal-mid inferolateral hypokinesis with grade II diastolic dysfunction, RV normal, PASP 42 ?4. Atrial fibrillation: Paroxysmal.   ?5. Type II diabetes ?6. CKD: Stage III. ?7. COPD: Smokes 1 ppd.  ?8. 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  No longer drinks.  ?9. Hypothyroidism ?10. PAD: Atherectomy SFA in 2014 (Dr Gwenlyn Found).  Peripheral arterial dopplers (2/17) with focal 75-99% proximal right SFA stenosis, occluded mid-distal right SFA, chronic occlusion of all runoff arteries on right. ?- In 5/18, she had peripheral angiography with atherectomy of right SFA.   ?- In 6/18, she had right 2nd ray resection later followed by transmetatarsal amputation on right.  ?- 7/18 ABIs showed 0.65 on right, 0.31 on left.   ?- Peripheral angiography 8/20: Totally occluded left SFA.  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 Donnetta Hutching.   ?- ABIs (2/21): 0.44 R, 0.99 L.  ?- Balloon angioplasty right SFA 7/22.  ?- ABIs (8/22): severe right SFA disease.  ?11. Anemia ?12. Prior cocaine abuse.  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  ?14. OSA: Has not been able to tolerate CPAP.  ? ?SH: Lives with sister.  Prior cocaine abuse.  Prior ETOH abuse.  Quit smoking in 1/17, restarted, and quit again in 4/19, restarted again. ? ?FH: CAD ? ?Review of systems complete and found to be negative unless listed in HPI.   ? ?Current Outpatient Medications  ?Medication Sig Dispense Refill  ? ACCU-CHEK GUIDE test strip USE TO CHECK BLOOD SUGAR FOUR TIMES DAILY    ? acetaminophen (TYLENOL) 500 MG tablet Take 500 mg by mouth every 6 (six) hours as needed for  moderate pain.    ? albuterol (VENTOLIN HFA) 108 (90 Base) MCG/ACT inhaler Inhale 2 puffs into the lungs every 6 (six) hours as needed for wheezing or shortness of breath. 18 g 3  ? allopurinol (ZYLOPRIM) 100 MG tablet Take 1 tablet (100 mg total) by mouth daily. 30 tablet 6  ? amiodarone (PACERONE) 200 MG tablet TAKE 1/2 TABLET (100 MG TOTAL) BY MOUTH DAILY. 15 tablet 3  ? amLODipine (NORVASC) 10 MG tablet Take 1 tablet (10 mg total) by mouth daily. 90 tablet 3  ? apixaban (ELIQUIS) 5 MG TABS tablet Take 1 tablet (5 mg total) by mouth 2 (two) times daily. 60 tablet 8  ? atorvastatin (LIPITOR) 40 MG tablet Take 1 tablet (40 mg total) by mouth every evening. 90 tablet 1  ? budesonide-formoterol (SYMBICORT) 160-4.5 MCG/ACT inhaler Inhale 2 puffs into the lungs 2 (two) times daily. 1 Inhaler 2  ? buPROPion (WELLBUTRIN SR) 150 MG 12 hr tablet Take 150 mg by mouth 2 (two) times daily.    ? colchicine 0.6 MG tablet TAKE 2 TABS (1.2 MG) AT THE ONSET OF A  GOUT FLARE, MAY TAKE 1 TAB (0.6 MG) IN 1 HOUR IF SYMPTOMS (Patient taking differently: daily. Take 2 tabs (1.2 mg) at the onset of a gout flare, may take 1 tab (0.6 mg) in 1 hour if symptoms) 30 tablet 3  ? Continuous Blood Gluc Sensor (FREESTYLE LIBRE 2 SENSOR) MISC USE 1 (ONE) EACH EVERY 2 WEEKS 2 each 11  ? Dulaglutide (TRULICITY) 1.5 KL/5.0VD SOPN Inject 1.5 mg into the skin once a week. 2 mL 6  ? empagliflozin (JARDIANCE) 10 MG TABS tablet Take 1 tablet (10 mg total) by mouth daily before breakfast. 90 tablet 3  ? ferrous sulfate 324 MG TBEC Take 1 tablet (324 mg total) by mouth 2 (two) times daily as needed. 60 tablet 3  ? FLUoxetine (PROZAC) 20 MG capsule TAKE 1 CAPSULE (20 MG TOTAL) BY MOUTH AT BEDTIME. 90 capsule 0  ? folic acid (FOLVITE) 1 MG tablet TAKE 1 TABLET (1 MG TOTAL) BY MOUTH DAILY. 90 tablet 1  ? hydrALAZINE (APRESOLINE) 50 MG tablet Take 1 tablet (50 mg total) by mouth 3 (three) times daily. 90 tablet 6  ? insulin glargine (LANTUS) 100 UNIT/ML Solostar  Pen Inject 30 Units into the skin daily. 30 mL 6  ? insulin lispro (HUMALOG) 100 UNIT/ML KwikPen Inject 8 Units into the skin 3 (three) times daily with meals. 15 mL 0  ? levothyroxine (SYNTHROID) 50 MCG table

## 2022-02-02 ENCOUNTER — Other Ambulatory Visit (HOSPITAL_COMMUNITY): Payer: Self-pay

## 2022-02-02 NOTE — Progress Notes (Signed)
Paramedicine Encounter    Patient ID: [name], female    DOB: 07-06-1961, 61 y.o.   MRN: [id number]   Pt reports doing ok. She was seen in clinic the other day. Her torsemide was increased and her hydralazine was increased as well.  Was able to make the change of torsemide but due to her being on bubble packs I didn't have enough of hydralazine to make that change yet-it will be done in 1 more week.  I had to move her bubble pack of meds to pill box for now.  I am taking her new med list to [facility] to ensure it is correct now.   She reports being better compliant with cardiomems.  I informed her of the elevated CBG's and the clinic wants her to f/u with PCP.    BP (!) 122/58   Pulse 75   Resp 20   Wt 218 lb (98.9 kg)   LMP  (LMP Unknown)   SpO2 96%   BMI 37.42 kg/m  Weight yesterday-? Last visit weight-218 @ clinic   Patient Care Team: [name], MD as PCP - General (Family Medicine) Runell Gess, MD as PCP - Cardiology (Cardiology) Lanier Prude, MD as PCP - Electrophysiology (Cardiology) Runell Gess, MD as Consulting Physician (Cardiology) Nyoka Cowden, MD as Consulting Physician (Pulmonary Disease) System, Provider Not In  Patient Active Problem List   Diagnosis Date Noted   Hyperlipidemia associated with type 2 diabetes mellitus ([name]) 07/29/2021   (HFpEF) heart failure with preserved ejection fraction ([name]) 07/29/2021   Bradycardia 06/13/2021   Uncontrolled type 2 diabetes mellitus with hyperglycemia ([name]) 06/13/2021   Acute kidney injury superimposed on CKD ([name]) 06/13/2021   Critical ischemia of foot ([name]) 06/07/2021   Fall 05/05/2021   PAF (paroxysmal atrial fibrillation) ([name]) 05/05/2021   COPD (chronic obstructive pulmonary disease) ([name]) 05/05/2021   DM (diabetes mellitus), type 2 with complications ([name]) 05/05/2021   PAD (peripheral artery disease) ([name]) 05/05/2021   Cellulitis 05/05/2021   Acute on chronic combined systolic and  diastolic CHF (congestive heart failure) ([name]) 05/05/2021   OSA (obstructive sleep apnea) 05/05/2021   CKD (chronic kidney disease) stage 3, GFR 30-59 ml/min ([name]) 05/05/2021   AKI (acute kidney injury) ([name]) 05/04/2021   Pressure injury of skin 05/04/2021   Ulcer of foot, unspecified laterality, limited to breakdown of skin ([name]) 02/17/2021   Altered mental status 01/02/2021   AF (paroxysmal atrial fibrillation) ([name]) 01/02/2021   CAD (coronary artery disease) 01/02/2021   PVD (peripheral vascular disease) ([name]) 01/02/2021   Acute renal failure superimposed on stage 4 chronic kidney disease ([name]) 01/02/2021   Diabetes mellitus type 2, uncontrolled, with complications 01/02/2021   Diabetic nephropathy ([name]) 01/02/2021   Low back pain 06/01/2020   Hyperlipidemia 04/03/2020   Muscle weakness 03/20/2020   Closed fracture of neck of right radius 03/13/2020   Pain in elbow 03/12/2020   PAD (peripheral artery disease) ([name]) 12/26/2019   Acute renal failure ([name])    Acute respiratory failure with hypoxia ([name]) 12/02/2019   Acute exacerbation of CHF (congestive heart failure) ([name]) 12/01/2019   Cellulitis in diabetic foot ([name]) 07/08/2019   Osteomyelitis ([name]) 06/21/2019   NICM (nonischemic cardiomyopathy) ([name]) 06/20/2019   Non-healing ulcer ([name]) 06/20/2019   Acute CHF (congestive heart failure) ([name]) 11/26/2018   CHF (congestive heart failure) ([name]) 11/26/2018   Acute respiratory failure ([name]) 10/21/2018   AKI (acute kidney injury) ([name]) 08/18/2018   Coagulation disorder ([name]) 08/09/2017  Depression 07/21/2017   At risk for adverse drug reaction 06/20/2017   Peripheral neuropathy 06/20/2017   Acute osteomyelitis of right foot ([name]) 06/13/2017   S/P transmetatarsal amputation of foot, right ([name]) 06/05/2017   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) 05/19/2017   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[name])    SIRS (systemic  inflammatory response syndrome) ([name]) 04/06/2017   CKD (chronic kidney disease), stage III ([name]) 11/24/2016   Suspected sleep apnea 11/24/2016   Ulcer of toe of right foot, with necrosis of bone ([name]) 10/27/2016   Ulcer of left lower leg ([name]) 05/19/2016   Severe obesity (BMI >= 40) ([name]) 02/24/2016   COPD GOLD 0  02/24/2016   Morbid obesity ([name]) 02/24/2016   Encounter for therapeutic drug monitoring 02/10/2016   Symptomatic bradycardia 01/12/2016   Hypertension associated with diabetes ([name]) 12/22/2015   Chronic combined systolic and diastolic CHF (congestive heart failure) ([name])    Wheeze    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 11/08/2015   Tobacco abuse 10/23/2015   Coronary artery disease    DOE (dyspnea on exertion) 04/29/2015   Diabetes mellitus type 2, uncontrolled 02/08/2015   Influenza A 02/07/2015   Wrist fracture, left, with routine healing, subsequent encounter 02/05/2015   Wrist fracture, left, closed, initial encounter 01/29/2015   PAF (paroxysmal atrial fibrillation) ([name]) 01/16/2015   Carotid arterial disease ([name]) 01/16/2015   Claudication ([name]) 01/15/2015   Demand ischemia ([name]) 10/29/2014   Insomnia 02/03/2014   COPD with acute exacerbation ([name]) 02/01/2014   S/P peripheral artery angioplasty - [employer]boHawk atherectomy; R SFA 09/11/2013    Class: Acute   Leg pain, bilateral 08/19/2013   Hypothyroidism 07/31/2013   Cellulitis 06/13/2013   History of cocaine abuse ([name]) 06/13/2013   Long term current use of anticoagulant therapy 05/20/2013   Alcohol abuse    Narcotic abuse ([name])    Marijuana abuse    Alcoholic cirrhosis ([name])    DM (diabetes mellitus), type 2 with peripheral vascular complications ([name])     Current Outpatient Medications:    ACCU-CHEK GUIDE test strip, USE TO CHECK BLOOD SUGAR FOUR TIMES DAILY, Disp: , Rfl:    acetaminophen (TYLENOL) 500 MG tablet, Take 500 mg by mouth every 6 (six) hours as needed for moderate pain., Disp: , Rfl:    albuterol (VENTOLIN  HFA) 108 (90 Base) MCG/ACT inhaler, Inhale 2 puffs into the lungs every 6 (six) hours as needed for wheezing or shortness of breath., Disp: 18 g, Rfl: 3   allopurinol (ZYLOPRIM) 100 MG tablet, Take 1 tablet (100 mg total) by mouth daily., Disp: 30 tablet, Rfl: 6   amiodarone (PACERONE) 200 MG tablet, TAKE 1/2 TABLET (100 MG TOTAL) BY MOUTH DAILY., Disp: 15 tablet, Rfl: 3   amLODipine (NORVASC) 10 MG tablet, Take 1 tablet (10 mg total) by mouth daily., Disp: 90 tablet, Rfl: 3   apixaban (ELIQUIS) 5 MG TABS tablet, Take 1 tablet (5 mg total) by mouth 2 (two) times daily., Disp: 60 tablet, Rfl: 8   atorvastatin (LIPITOR) 40 MG tablet, Take 1 tablet (40 mg total) by mouth every evening., Disp: 90 tablet, Rfl: 1   budesonide-formoterol (SYMBICORT) 160-4.5 MCG/ACT inhaler, Inhale 2 puffs into the lungs 2 (two) times daily., Disp: 1 Inhaler, Rfl: 2   buPROPion (WELLBUTRIN SR) 150 MG 12 hr tablet, Take 150 mg by mouth 2 (two) times daily., Disp: , Rfl:    colchicine 0.6 MG tablet, TAKE 2 TABS (1.2 MG) AT THE ONSET  OF A GOUT FLARE, MAY TAKE 1 TAB (0.6 MG) IN 1 HOUR IF SYMPTOMS (Patient taking differently: daily. Take 2 tabs (1.2 mg) at the onset of a gout flare, may take 1 tab (0.6 mg) in 1 hour if symptoms), Disp: 30 tablet, Rfl: 3   Continuous Blood Gluc Sensor (FREESTYLE LIBRE 2 SENSOR) MISC, USE 1 (ONE) EACH EVERY 2 WEEKS, Disp: 2 each, Rfl: 11   Dulaglutide (TRULICITY) 1.5 MG/0.5ML SOPN, Inject 1.5 mg into the skin once a week., Disp: 2 mL, Rfl: 6   empagliflozin (JARDIANCE) 10 MG TABS tablet, Take 1 tablet (10 mg total) by mouth daily before breakfast., Disp: 90 tablet, Rfl: 3   ferrous sulfate 324 MG TBEC, Take 1 tablet (324 mg total) by mouth 2 (two) times daily as needed., Disp: 60 tablet, Rfl: 3   FLUoxetine (PROZAC) 20 MG capsule, TAKE 1 CAPSULE (20 MG TOTAL) BY MOUTH AT BEDTIME., Disp: 90 capsule, Rfl: 0   folic acid (FOLVITE) 1 MG tablet, TAKE 1 TABLET (1 MG TOTAL) BY MOUTH DAILY., Disp: 90 tablet,  Rfl: 1   hydrALAZINE (APRESOLINE) 50 MG tablet, Take 1.5 tablets (75 mg total) by mouth 3 (three) times daily., Disp: 140 tablet, Rfl: 4   insulin glargine (LANTUS) 100 UNIT/ML Solostar Pen, Inject 30 Units into the skin daily., Disp: 30 mL, Rfl: 6   insulin lispro (HUMALOG) 100 UNIT/ML KwikPen, Inject 8 Units into the skin 3 (three) times daily with meals., Disp: 15 mL, Rfl: 0   levothyroxine (SYNTHROID) 50 MCG tablet, Take 1 tablet (50 mcg total) by mouth daily., Disp: 90 tablet, Rfl: 1   losartan (COZAAR) 25 MG tablet, Take 2 tablets (50 mg total) by mouth daily. (Patient taking differently: Take 50 mg by mouth at bedtime.), Disp: 180 tablet, Rfl: 0   Omega-3 Fatty Acids (FISH OIL PO), Take 1 capsule by mouth daily., Disp: , Rfl:    ondansetron (ZOFRAN) 4 MG tablet, Take 1 tablet (4 mg total) by mouth every 8 (eight) hours as needed for nausea or vomiting., Disp: 20 tablet, Rfl: 0   pantoprazole (PROTONIX) 40 MG tablet, TAKE 1 TABLET (40 MG TOTAL) BY MOUTH DAILY., Disp: 90 tablet, Rfl: 0   potassium chloride SA (KLOR-CON M20) 20 MEQ tablet, Take 1 tablet (20 mEq total) by mouth daily., Disp: 90 tablet, Rfl: 3   torsemide (DEMADEX) 20 MG tablet, Take 4 tablets (80 mg total) by mouth in the morning AND 3 tablets (60 mg total) every evening., Disp: 210 tablet, Rfl: 4   VITAMIN D PO, Take 1 tablet by mouth daily., Disp: , Rfl:  Allergies  Allergen Reactions   Gabapentin Nausea And Vomiting and Other (See Comments)    POSSIBLE SHAKING   Lyrica [Pregabalin] Other (See Comments)    Shaking       Social History   Socioeconomic History   Marital status: Single    Spouse name: Not on file   Number of children: 1   Years of education: 12   Highest education level: 12th grade  Occupational History   Occupation: disabled  Tobacco Use   Smoking status: Former    Packs/day: 1.00    Years: 44.00    Pack years: 44.00    Types: E-cigarettes, Cigarettes   Smokeless tobacco: Former    Types:  Snuff  Vaping Use   Vaping Use: Former   Devices: 11/26/2018 "stopped months ago"  Substance and Sexual Activity   Alcohol use: Yes    Comment: occ  Drug use: Yes    Types: "Crack" cocaine, Marijuana, Oxycodone   Sexual activity: Not Currently  Other Topics Concern   Not on file  Social History Narrative   ** Merged History Encounter **       Lives in [place], in motel with sister.  They are looking to move but don't have a place to go yet.     Social Determinants of Health   Financial Resource Strain: Not on file  Food Insecurity: Not on file  Transportation Needs: Not on file  Physical Activity: Not on file  Stress: Not on file  Social Connections: Not on file  Intimate Partner Violence: Not on file    Physical Exam      Future Appointments  Date Time Provider Department Center  04/04/2022  2:00 PM Laurey Morale, MD [facility] None  01/11/2023  9:00 AM [facility]       Kerry Hough, EMT-Paramedic [phone] [facility] Paramedic  02/02/22

## 2022-02-03 ENCOUNTER — Other Ambulatory Visit (HOSPITAL_COMMUNITY): Payer: Self-pay | Admitting: Cardiovascular Disease

## 2022-02-03 DIAGNOSIS — Z9862 Peripheral vascular angioplasty status: Secondary | ICD-10-CM

## 2022-02-03 DIAGNOSIS — I739 Peripheral vascular disease, unspecified: Secondary | ICD-10-CM

## 2022-02-09 ENCOUNTER — Telehealth (HOSPITAL_COMMUNITY): Payer: Self-pay | Admitting: Licensed Clinical Social Worker

## 2022-02-09 NOTE — Telephone Encounter (Signed)
HF Paramedicine Team Based Care Meeting ? ?HF MD- NA  ?HF NP - [name] Clegg NP-C  ? ?[facility] HF Paramedicine  ?Marylouise Stacks ?Jeris Penta  ?Tammy Sours ? ?Hospital admit within the last 30 days for heart failure? no ? ?Medications concerns? none ? ?Transportation issues ? no ? ?Education needs? Don't believe she will benefit from further education  ? ?Eligible for discharge? Getting set up on bubble packs- will verify this week they are correct and plan to DC ? ?Jorge Ny, LCSW ?Clinical Social Worker ?[facility] ?Desk#: [phone] ?Cell#: [phone] ? ?

## 2022-02-10 ENCOUNTER — Other Ambulatory Visit (HOSPITAL_COMMUNITY): Payer: Self-pay

## 2022-02-10 ENCOUNTER — Other Ambulatory Visit (HOSPITAL_COMMUNITY): Payer: Self-pay | Admitting: Cardiovascular Disease

## 2022-02-10 ENCOUNTER — Other Ambulatory Visit: Payer: Self-pay | Admitting: Family Medicine

## 2022-02-10 DIAGNOSIS — Z9889 Other specified postprocedural states: Secondary | ICD-10-CM

## 2022-02-10 DIAGNOSIS — I6521 Occlusion and stenosis of right carotid artery: Secondary | ICD-10-CM

## 2022-02-10 MED ORDER — TORSEMIDE 20 MG PO TABS: ORAL_TABLET | ORAL | 4 refills | Status: DC

## 2022-02-17 ENCOUNTER — Other Ambulatory Visit (HOSPITAL_COMMUNITY): Payer: Self-pay

## 2022-02-17 ENCOUNTER — Telehealth (HOSPITAL_COMMUNITY): Payer: Self-pay | Admitting: Adult Health

## 2022-02-17 MED ORDER — TORSEMIDE 20 MG PO TABS: 80.0000 mg | ORAL_TABLET | Freq: Two times a day (BID) | ORAL | 4 refills | Status: DC

## 2022-02-17 NOTE — Telephone Encounter (Signed)
Cardiomems Goal 20 .  ? ?Readings over the last 3 days 26,24,24.  ? ?Will need to increase torsemide to 80 mg twice a day. Order sent to her pharmacy  ? ?Brayley tells me she has been taking all medications and she is waiting for bubble packs. She no longer has medications at home.  ? ? ?I called Marylouise Stacks HF Paramedic with the above. She will see today and adjust medications.  ? ?Virga Haltiwanger NP-C  ?11:53 AM ? ? ? ?

## 2022-02-17 NOTE — Progress Notes (Signed)
Paramedicine Encounter ? ? ? Patient ID: [name], female    DOB: April 15, 1961, 61 y.o.   MRN: [id number] ? ?Pt reports that she is doing ok. [name] called me reporting that her cardiomems was increased and her torsemide is being increased to 80mg  BID.  ?At last visit her hydralazine was increased as well-but bubble packs does not reflect that.  ?I will take bubble packs to pharmacy and it will be corrected.  ?Pharmacy is aware of the hydralazine dose and increased torsemide.  ?She had a couple of extra torsemide in old pill box- so we used that to put her pills in pill box through tomor and pharmacy will bring back out tomor along with her colchicine and lantus.  ? ?BP (!) 160/78   Pulse 88   Resp 18   Wt 224 lb (101.6 kg)   LMP  (LMP Unknown)   SpO2 95%   BMI 38.45 kg/m?  ?Weight yesterday-? ?Last visit weight-218  ? ?Patient Care Team: ?Charlott Rakes, MD as PCP - General (Family Medicine) ?[name], MD as PCP - Cardiology (Cardiology) ?[name], MD as PCP - Electrophysiology (Cardiology) ?[name], MD as Consulting Physician (Cardiology) ?[name], MD as Consulting Physician (Pulmonary Disease) ?System, Provider Not In ? ?Patient Active Problem List  ? Diagnosis Date Noted  ? Hyperlipidemia associated with type 2 diabetes mellitus ([place]) 07/29/2021  ? (HFpEF) heart failure with preserved ejection fraction ([place]) 07/29/2021  ? Bradycardia 06/13/2021  ? Uncontrolled type 2 diabetes mellitus with hyperglycemia ([place]) 06/13/2021  ? Acute kidney injury superimposed on CKD ([place]) 06/13/2021  ? Critical ischemia of foot ([place]) 06/07/2021  ? Fall 05/05/2021  ? PAF (paroxysmal atrial fibrillation) ([place]) 05/05/2021  ? COPD (chronic obstructive pulmonary disease) ([place]) 05/05/2021  ? DM (diabetes mellitus), type 2 with complications ([place]) 93/71/6967  ? PAD (peripheral artery disease) ([place]) 05/05/2021  ? Cellulitis 05/05/2021  ? Acute on chronic combined systolic and diastolic CHF  (congestive heart failure) ([place]) 05/05/2021  ? OSA (obstructive sleep apnea) 05/05/2021  ? CKD (chronic kidney disease) stage 3, GFR 30-59 ml/min ([name]) 05/05/2021  ? AKI (acute kidney injury) ([place]) 05/04/2021  ? Pressure injury of skin 05/04/2021  ? Ulcer of foot, unspecified laterality, limited to breakdown of skin ([place]) 02/17/2021  ? Altered mental status 01/02/2021  ? AF (paroxysmal atrial fibrillation) ([place]) 01/02/2021  ? CAD (coronary artery disease) 01/02/2021  ? PVD (peripheral vascular disease) ([place]) 01/02/2021  ? Acute renal failure superimposed on stage 4 chronic kidney disease ([place]) 01/02/2021  ? Diabetes mellitus type 2, uncontrolled, with complications 89/38/1017  ? Diabetic nephropathy ([place]) 01/02/2021  ? Low back pain 06/01/2020  ? Hyperlipidemia 04/03/2020  ? Muscle weakness 03/20/2020  ? Closed fracture of neck of right radius 03/13/2020  ? Pain in elbow 03/12/2020  ? PAD (peripheral artery disease) ([place]) 12/26/2019  ? Acute renal failure ([place])   ? Acute respiratory failure with hypoxia ([place]) 12/02/2019  ? Acute exacerbation of CHF (congestive heart failure) ([place]) 12/01/2019  ? Cellulitis in diabetic foot ([place]) 07/08/2019  ? Osteomyelitis ([place]) 06/21/2019  ? NICM (nonischemic cardiomyopathy) ([place]) 06/20/2019  ? Non-healing ulcer ([place]) 06/20/2019  ? Acute CHF (congestive heart failure) ([place]) 11/26/2018  ? CHF (congestive heart failure) ([place]) 11/26/2018  ? Acute respiratory failure ([place]) 10/21/2018  ? AKI (acute kidney injury) ([place]) 08/18/2018  ? Coagulation disorder ([place]) 08/09/2017  ? Depression 07/21/2017  ? At risk for adverse drug reaction 06/20/2017  ? Peripheral neuropathy  06/20/2017  ? Acute osteomyelitis of right foot ([place]) 06/13/2017  ? S/P transmetatarsal amputation of foot, right ([place]) 06/05/2017  ? 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[place])   ? SIRS (systemic inflammatory response  syndrome) ([place]) 04/06/2017  ? CKD (chronic kidney disease), stage III ([place]) 11/24/2016  ? Suspected sleep apnea 11/24/2016  ? Ulcer of toe of right foot, with necrosis of bone ([place]) 10/27/2016  ? Ulcer of left lower leg ([place]) 05/19/2016  ? Severe obesity (BMI >= 40) ([place]) 02/24/2016  ? COPD GOLD 0  02/24/2016  ? Morbid obesity ([place]) 02/24/2016  ? Encounter for therapeutic drug monitoring 02/10/2016  ? Symptomatic bradycardia 01/12/2016  ? Hypertension associated with diabetes ([place]) 12/22/2015  ? Chronic combined systolic and diastolic CHF (congestive heart failure) ([place])   ? Wheeze   ? 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 11/08/2015  ? Tobacco abuse 10/23/2015  ? Coronary artery disease   ? DOE (dyspnea on exertion) 04/29/2015  ? Diabetes mellitus type 2, uncontrolled 02/08/2015  ? Influenza A 02/07/2015  ? Wrist fracture, left, with routine healing, subsequent encounter 02/05/2015  ? Wrist fracture, left, closed, initial encounter 01/29/2015  ? PAF (paroxysmal atrial fibrillation) ([place]) 01/16/2015  ? Carotid arterial disease ([place]) 01/16/2015  ? Claudication ([place]) 01/15/2015  ? Demand ischemia ([place]) 10/29/2014  ? Insomnia 02/03/2014  ? COPD with acute exacerbation ([place]) 02/01/2014  ? S/P peripheral artery angioplasty - TurboHawk atherectomy; R SFA 09/11/2013  ?  Class: Acute  ? Leg pain, bilateral 08/19/2013  ? Hypothyroidism 07/31/2013  ? Cellulitis 06/13/2013  ? History of cocaine abuse ([place]) 06/13/2013  ? Long term current use of anticoagulant therapy 05/20/2013  ? Alcohol abuse   ? Narcotic abuse ([place])   ? Marijuana abuse   ? Alcoholic cirrhosis ([place])   ? DM (diabetes mellitus), type 2 with peripheral vascular complications ([place])   ? ? ?Current Outpatient Medications:  ?  ACCU-CHEK GUIDE test strip, USE TO CHECK BLOOD SUGAR FOUR TIMES DAILY, Disp: , Rfl:  ?  acetaminophen (TYLENOL) 500 MG tablet, Take 500 mg by mouth every 6 (six) hours as needed for moderate pain., Disp: , Rfl:  ?  albuterol (VENTOLIN HFA) 108 (90 Base)  MCG/ACT inhaler, Inhale 2 puffs into the lungs every 6 (six) hours as needed for wheezing or shortness of breath., Disp: 18 g, Rfl: 3 ?  allopurinol (ZYLOPRIM) 100 MG tablet, TAKE 1 TABLET (100 MG TOTAL) BY MOUTH DAILY(AM), Disp: 30 tablet, Rfl: 0 ?  amiodarone (PACERONE) 200 MG tablet, TAKE 1/2 TABLET (100 MG TOTAL) BY MOUTH DAILY., Disp: 15 tablet, Rfl: 3 ?  amLODipine (NORVASC) 10 MG tablet, Take 1 tablet (10 mg total) by mouth daily., Disp: 90 tablet, Rfl: 3 ?  apixaban (ELIQUIS) 5 MG TABS tablet, Take 1 tablet (5 mg total) by mouth 2 (two) times daily., Disp: 60 tablet, Rfl: 8 ?  atorvastatin (LIPITOR) 40 MG tablet, Take 1 tablet (40 mg total) by mouth every evening., Disp: 90 tablet, Rfl: 1 ?  budesonide-formoterol (SYMBICORT) 160-4.5 MCG/ACT inhaler, Inhale 2 puffs into the lungs 2 (two) times daily., Disp: 1 Inhaler, Rfl: 2 ?  buPROPion (WELLBUTRIN SR) 150 MG 12 hr tablet, Take 150 mg by mouth 2 (two) times daily., Disp: , Rfl:  ?  colchicine 0.6 MG tablet, TAKE 2 TABS (1.2 MG) AT THE ONSET OF A GOUT FLARE, MAY TAKE 1 TAB (0.6 MG) IN 1 HOUR IF SYMPTOMS (  Patient taking differently: daily. Take 2 tabs (1.2 mg) at the onset of a gout flare, may take 1 tab (0.6 mg) in 1 hour if symptoms), Disp: 30 tablet, Rfl: 3 ?  Continuous Blood Gluc Sensor (FREESTYLE LIBRE 2 SENSOR) MISC, USE 1 (ONE) EACH EVERY 2 WEEKS, Disp: 2 each, Rfl: 11 ?  Dulaglutide (TRULICITY) 1.5 XL/2.1VG SOPN, Inject 1.5 mg into the skin once a week., Disp: 2 mL, Rfl: 6 ?  empagliflozin (JARDIANCE) 10 MG TABS tablet, Take 1 tablet (10 mg total) by mouth daily before breakfast., Disp: 90 tablet, Rfl: 3 ?  ferrous sulfate 324 MG TBEC, Take 1 tablet (324 mg total) by mouth 2 (two) times daily as needed., Disp: 60 tablet, Rfl: 3 ?  FLUoxetine (PROZAC) 20 MG capsule, TAKE 1 CAPSULE (20 MG TOTAL) BY MOUTH AT BEDTIME., Disp: 90 capsule, Rfl: 0 ?  folic acid (FOLVITE) 1 MG tablet, TAKE 1 TABLET (1 MG TOTAL) BY MOUTH DAILY., Disp: 90 tablet, Rfl: 1 ?   hydrALAZINE (APRESOLINE) 50 MG tablet, Take 1.5 tablets (75 mg total) by mouth 3 (three) times daily., Disp: 140 tablet, Rfl: 4 ?  insulin glargine (LANTUS) 100 UNIT/ML Solostar Pen, Inject 30 Units into the skin daily.Sharma Covert

## 2022-02-26 IMAGING — DX DG CHEST 1V PORT
1 series · 1 of 1 positions shown · non-contrast
Comparison: Radiograph 05/03/2021

CLINICAL DATA: Bradycardia.  Weakness.

EXAM:
PORTABLE CHEST 1 VIEW

[chest ap]
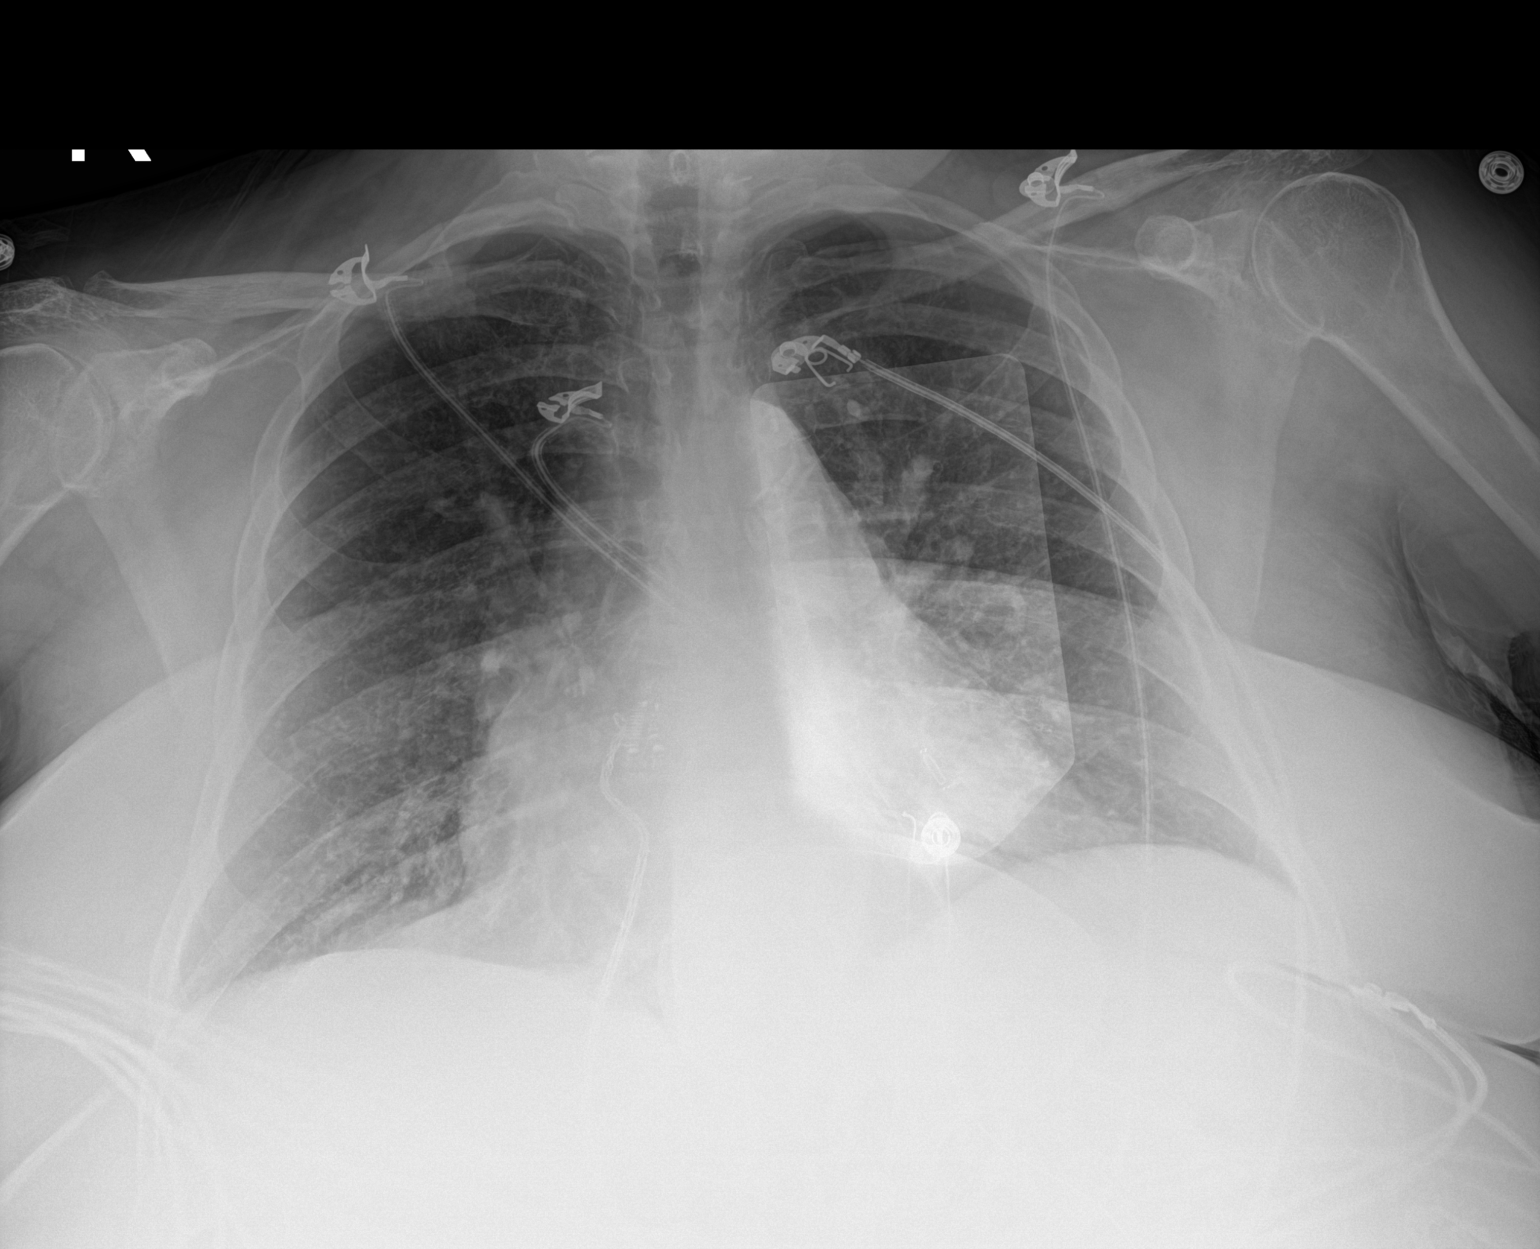

[1 of 1 positions shown; findings below may reference images not displayed]

FINDINGS: Mild cardiomegaly. CardioMEMS device again seen. Developing vascular
congestion. No focal airspace disease. No pleural effusion or
pneumothorax. No acute osseous abnormalities are seen.
IMPRESSION: Mild cardiomegaly. Developing vascular congestion.

## 2022-02-28 ENCOUNTER — Telehealth (HOSPITAL_COMMUNITY): Payer: Self-pay

## 2022-02-28 NOTE — Telephone Encounter (Signed)
Attempted to contact pt- ref home visit,  no answer.  ?Will try again.  ? ?Marylouise Stacks, [place]  ?[phone] ?02/28/2022 ? ? ?

## 2022-03-14 ENCOUNTER — Other Ambulatory Visit: Payer: Self-pay | Admitting: Family Medicine

## 2022-03-14 ENCOUNTER — Other Ambulatory Visit: Payer: Self-pay | Admitting: Physician Assistant

## 2022-03-14 DIAGNOSIS — F419 Anxiety disorder, unspecified: Secondary | ICD-10-CM

## 2022-03-14 DIAGNOSIS — E039 Hypothyroidism, unspecified: Secondary | ICD-10-CM

## 2022-03-16 ENCOUNTER — Other Ambulatory Visit (HOSPITAL_COMMUNITY): Payer: Self-pay

## 2022-03-16 NOTE — Progress Notes (Addendum)
I have not been able to reach pt in the past week or so, so I was able to come by for a quick check in.  ?She reports her nephew got her phone and messed it up.  ?[place] had spent some time down there and she has not been using her cardiomems. But she hasnt been using cardiomems even before that as well. I advised her to try best possible to use it daily.  ? ?Weight today-222 ? ?Her weight is only down 2 lbs from last visit a few wks ago.  ? ?The plavix is back in her bubble packs--will need to get her to remove it when she takes it-I will let pharmacy know that it no longer needs to be placed in pill box.  ?I took out this week bubble pack to move to pill box for this week only to show her which tablet she needs to avoid taking.  ?I showed her which pill and she said she would be able to take it out.  ?Will f/u next week.  ? ?Marylouise Stacks, [place]  ?[phone] ?03/16/2022 ? ?

## 2022-03-31 ENCOUNTER — Other Ambulatory Visit (HOSPITAL_COMMUNITY): Payer: Self-pay

## 2022-03-31 ENCOUNTER — Ambulatory Visit: Payer: Self-pay

## 2022-03-31 NOTE — Telephone Encounter (Signed)
Chief Complaint: Vomiting and diarrhea Symptoms: severe watery diarrhea, vomiting, high BS, nausea, mouth dry, strong smell to urine and dark urine, continuous blood glucose >500 Frequency: since Tuesday Pertinent Negatives: Patient denies dizziness,weight loss, no blood in stools Disposition: [x] ED /[] Urgent Care (no appt availability in office) / [] Appointment(In office/virtual)/ []  [facility]/ [] Home Care/ [] Refused Recommended Disposition /[] [facility]/ []  Follow-up with PCP Additional Notes: advised to go to ED           Reason for Disposition  [1] SEVERE diarrhea (e.g., 7 or more times / day more than normal) AND [2] present > 24 hours (1 day)  Vomiting lasts > 4 hours  Answer Assessment - Initial Assessment Questions 1. NAUSEA SEVERITY: "How bad is the nausea?" (e.g., mild, moderate, severe; dehydration, weight loss)   - MILD: loss of appetite without change in eating habits   - MODERATE: decreased oral intake without significant weight loss, dehydration, or malnutrition   - SEVERE: inadequate caloric or fluid intake, significant weight loss, symptoms of dehydration     Right after eating 2. ONSET: "When did the nausea begin?"     2 days ago 3. VOMITING: "Any vomiting?" If Yes, ask: "How many times today?"     yes 4. RECURRENT SYMPTOM: "Have you had nausea before?" If Yes, ask: "When was the last time?" "What happened that time?"     Nausea last year and vomiting 5. CAUSE: "What do you think is causing the nausea?"     unknown 6. PREGNANCY: "Is there any chance you are pregnant?" (e.g., unprotected intercourse, missed birth control pill, broken condom)     N/a  Answer Assessment - Initial Assessment Questions 1. DIARRHEA SEVERITY: "How bad is the diarrhea?" "How many more stools have you had in the past 24 hours than normal?"    - NO DIARRHEA (SCALE 0)   - MILD (SCALE 1-3): Few loose or mushy BMs; increase of 1-3 stools over normal daily  number of stools; mild increase in ostomy output.   -  MODERATE (SCALE 4-7): Increase of 4-6 stools daily over normal; moderate increase in ostomy output. * SEVERE (SCALE 8-10; OR 'WORST POSSIBLE'): Increase of 7 or more stools daily over normal; moderate increase in ostomy output; incontinence.     severe 2. ONSET: "When did the diarrhea begin?"      Tuesday 3. BM CONSISTENCY: "How loose or watery is the diarrhea?"      Watery  4. VOMITING: "Are you also vomiting?" If Yes, ask: "How many times in the past 24 hours?"      Yes-8 in past 24 hours 5. ABDOMINAL PAIN: "Are you having any abdominal pain?" If Yes, ask: "What does it feel like?" (e.g., crampy, dull, intermittent, constant)      no 6. ABDOMINAL PAIN SEVERITY: If present, ask: "How bad is the pain?"  (e.g., Scale 1-10; mild, moderate, or severe)   - MILD (1-3): doesn't interfere with normal activities, abdomen soft and not tender to touch    - MODERATE (4-7): interferes with normal activities or awakens from sleep, abdomen tender to touch    - SEVERE (8-10): excruciating pain, doubled over, unable to do any normal activities       none 7. ORAL INTAKE: If vomiting, "Have you been able to drink liquids?" "How much liquids have you had in the past 24 hours?"     Yes-5 cups of water 4 cups lemonade  8. HYDRATION: "Any signs of dehydration?" (e.g., dry  mouth [not just dry lips], too weak to stand, dizziness, new weight loss) "When did you last urinate?"     Mouth is dry  Last urinated dark in color strong 9. EXPOSURE: "Have you traveled to a foreign country recently?" "Have you been exposed to anyone with diarrhea?" "Could you have eaten any food that was spoiled?"     No- 10. ANTIBIOTIC USE: "Are you taking antibiotics now or have you taken antibiotics in the past 2 months?"      Yes in past 2 months 11. OTHER SYMPTOMS: "Do you have any other symptoms?" (e.g., fever, blood in stool)       Elevated BS 12. PREGNANCY: "Is there any chance  you are pregnant?" "When was your last menstrual period?"       N/a  Answer Assessment - Initial Assessment Questions 1. BLOOD GLUCOSE: "What is your blood glucose level?"      400 lunch  high BS 2. ONSET: "When did you check the blood glucose?"     Uses CBG 3. USUAL RANGE: "What is your glucose level usually?" (e.g., usual fasting morning value, usual evening value)     245 160 4. KETONES: "Do you check for ketones (urine or blood test strips)?" If yes, ask: "What does the test show now?"      no 5. TYPE 1 or 2:  "Do you know what type of diabetes you have?"  (e.g., Type 1, Type 2, Gestational; doesn't know)      Type 2 diabetes 6. INSULIN: "Do you take insulin?" "What type of insulin(s) do you use? What is the mode of delivery? (syringe, pen; injection or pump)?"      Times a day short term and long term insulin 7. DIABETES PILLS: "Do you take any pills for your diabetes?" If yes, ask: "Have you missed taking any pills recently?"     no 8. OTHER SYMPTOMS: "Do you have any symptoms?" (e.g., fever, frequent urination, difficulty breathing, dizziness, weakness, vomiting)     Dehhydrated vomiting and diarrhea 9. PREGNANCY: "Is there any chance you are pregnant?" "When was your last menstrual period?"     N/a  Protocols used: Nausea-A-AH, Diarrhea-A-AH, Diabetes - High Blood Sugar-A-AH

## 2022-03-31 NOTE — Progress Notes (Addendum)
Paramedicine Encounter    Patient ID: [name], female    DOB: 02/15/1961, 61 y.o.   MRN: [id number]   Pt reports she has not been feeling well past few days, she reports n/v and a couple episodes of diarrhea.  Her CBG's are out of control. She reports compliance with her meds. Per bubble packs it appears she has been.  Her weight is down another 2 lbs from last time.  I advised her she could have GI bug, but it could be other things-her left leg had a weeping spot on it earlier, not actively weeping at the moment, but I could see the fluid stain on her sock. Her leg does feel warm to touch, she said this is kind of similar to her previous cellulitis but not as bad-advised this could be the beginning possibly and she needs to see doc ASAP. I advised her to contact PCP for further.  Meds checked.  V/s as noted.  She reports she is back using cardiomems. She got off track several days ago but back on now.   CBG's--400 today  For past 5/12-present her numbers have been between 250-hi   BP (!) 158/70   Pulse 78   Resp 18   Wt 220 lb (99.8 kg)   LMP  (LMP Unknown)   SpO2 96%   BMI 37.76 kg/m  Weight yesterday-? Last visit [place]  Patient Care Team: Charlott Rakes, MD as PCP - General (Family Medicine) [name], MD as PCP - Cardiology (Cardiology) [name], MD as PCP - Electrophysiology (Cardiology) [name], MD as Consulting Physician (Cardiology) [name], MD as Consulting Physician (Pulmonary Disease) System, Provider Not In  Patient Active Problem List   Diagnosis Date Noted   Hyperlipidemia associated with type 2 diabetes mellitus ([place]) 07/29/2021   (HFpEF) heart failure with preserved ejection fraction ([place]) 07/29/2021   Bradycardia 06/13/2021   Uncontrolled type 2 diabetes mellitus with hyperglycemia ([place]) 06/13/2021   Acute kidney injury superimposed on CKD ([place]) 06/13/2021   Critical ischemia of foot ([place]) 06/07/2021   Fall  05/05/2021   PAF (paroxysmal atrial fibrillation) ([place]) 05/05/2021   COPD (chronic obstructive pulmonary disease) ([place]) 05/05/2021   DM (diabetes mellitus), type 2 with complications ([place]) 24/58/0998   PAD (peripheral artery disease) ([place]) 05/05/2021   Cellulitis 05/05/2021   Acute on chronic combined systolic and diastolic CHF (congestive heart failure) ([place]) 05/05/2021   OSA (obstructive sleep apnea) 05/05/2021   CKD (chronic kidney disease) stage 3, GFR 30-59 ml/min ([place]) 05/05/2021   AKI (acute kidney injury) ([place]) 05/04/2021   Pressure injury of skin 05/04/2021   Ulcer of foot, unspecified laterality, limited to breakdown of skin ([place]) 02/17/2021   Altered mental status 01/02/2021   AF (paroxysmal atrial fibrillation) ([place]) 01/02/2021   CAD (coronary artery disease) 01/02/2021   PVD (peripheral vascular disease) ([place]) 01/02/2021   Acute renal failure superimposed on stage 4 chronic kidney disease ([place]) 01/02/2021   Diabetes mellitus type 2, uncontrolled, with complications 33/82/5053   Diabetic nephropathy ([place]) 01/02/2021   Low back pain 06/01/2020   Hyperlipidemia 04/03/2020   Muscle weakness 03/20/2020   Closed fracture of neck of right radius 03/13/2020   Pain in elbow 03/12/2020   PAD (peripheral artery disease) ([place]) 12/26/2019   Acute renal failure ([place])    Acute respiratory failure with hypoxia ([place]) 12/02/2019   Acute exacerbation of CHF (congestive heart failure) ([place]) 12/01/2019   Cellulitis in diabetic foot ([place]) 07/08/2019  Osteomyelitis ([place]) 06/21/2019   NICM (nonischemic cardiomyopathy) ([place]) 06/20/2019   Non-healing ulcer ([place]) 06/20/2019   Acute CHF (congestive heart failure) ([place]) 11/26/2018   CHF (congestive heart failure) ([place]) 11/26/2018   Acute respiratory failure ([place]) 10/21/2018   AKI (acute kidney injury) ([place]) 08/18/2018   Coagulation disorder ([place]) 08/09/2017   Depression 07/21/2017   At risk for adverse drug reaction 06/20/2017   Peripheral  neuropathy 06/20/2017   Acute osteomyelitis of right foot ([place]) 06/13/2017   S/P transmetatarsal amputation of foot, right ([place]) 06/05/2017   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 ([place]) 05/19/2017   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[place])    SIRS (systemic inflammatory response syndrome) ([place]) 04/06/2017   CKD (chronic kidney disease), stage III ([place]) 11/24/2016   Suspected sleep apnea 11/24/2016   Ulcer of toe of right foot, with necrosis of bone ([place]) 10/27/2016   Ulcer of left lower leg ([place]) 05/19/2016   Severe obesity (BMI >= 40) ([place]) 02/24/2016   COPD GOLD 0  02/24/2016   Morbid obesity ([place]) 02/24/2016   Encounter for therapeutic drug monitoring 02/10/2016   Symptomatic bradycardia 01/12/2016   Hypertension associated with diabetes ([place]) 12/22/2015   Chronic combined systolic and diastolic CHF (congestive heart failure) ([place])    [place]    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 11/08/2015   Tobacco abuse 10/23/2015   Coronary artery disease    DOE (dyspnea on exertion) 04/29/2015   Diabetes mellitus type 2, uncontrolled 02/08/2015   Influenza A 02/07/2015   Wrist fracture, left, with routine healing, subsequent encounter 02/05/2015   Wrist fracture, left, closed, initial encounter 01/29/2015   PAF (paroxysmal atrial fibrillation) ([place]) 01/16/2015   Carotid arterial disease ([place]) 01/16/2015   Claudication ([place]) 01/15/2015   Demand ischemia ([place]) 10/29/2014   Insomnia 02/03/2014   COPD with acute exacerbation ([place]) 02/01/2014   S/P peripheral artery angioplasty - TurboHawk atherectomy; R SFA 09/11/2013    Class: Acute   Leg pain, bilateral 08/19/2013   Hypothyroidism 07/31/2013   Cellulitis 06/13/2013   History of cocaine abuse ([place]) 06/13/2013   Long term current use of anticoagulant therapy 05/20/2013   Alcohol abuse    Narcotic abuse ([place])    Marijuana abuse    Alcoholic cirrhosis ([place])    DM (diabetes mellitus), type 2 with  peripheral vascular complications ([name])     Current Outpatient Medications:    ACCU-CHEK GUIDE test strip, USE TO CHECK BLOOD SUGAR FOUR TIMES DAILY, Disp: , Rfl:    acetaminophen (TYLENOL) 500 MG tablet, Take 500 mg by mouth every 6 (six) hours as needed for moderate pain., Disp: , Rfl:    albuterol (VENTOLIN HFA) 108 (90 Base) MCG/ACT inhaler, Inhale 2 puffs into the lungs every 6 (six) hours as needed for wheezing or shortness of breath., Disp: 18 g, Rfl: 3   allopurinol (ZYLOPRIM) 100 MG tablet, TAKE 1 TABLET (100 MG TOTAL) BY MOUTH DAILY(AM), Disp: 30 tablet, Rfl: 0   amiodarone (PACERONE) 200 MG tablet, TAKE 1/2 TABLET (100 MG TOTAL) BY MOUTH DAILY., Disp: 15 tablet, Rfl: 3   amLODipine (NORVASC) 10 MG tablet, Take 1 tablet (10 mg total) by mouth daily., Disp: 90 tablet, Rfl: 3   apixaban (ELIQUIS) 5 MG TABS tablet, Take 1 tablet (5 mg total) by mouth 2 (two) times daily., Disp: 60 tablet, Rfl: 8   atorvastatin (LIPITOR) 40 MG tablet, TAKE 1 TABLET (40 MG TOTAL) BY MOUTH EVERY EVENING (BEDTIME), Disp: 30 tablet, Rfl: 0  budesonide-formoterol (SYMBICORT) 160-4.5 MCG/ACT inhaler, Inhale 2 puffs into the lungs 2 (two) times daily., Disp: 1 Inhaler, Rfl: 2   buPROPion (WELLBUTRIN SR) 150 MG 12 hr tablet, Take 150 mg by mouth 2 (two) times daily., Disp: , Rfl:    colchicine 0.6 MG tablet, TAKE 2 TABS (1.2 MG) AT THE ONSET OF A GOUT FLARE, MAY TAKE 1 TAB (0.6 MG) IN 1 HOUR IF SYMPTOMS (Patient taking differently: daily. Take 2 tabs (1.2 mg) at the onset of a gout flare, may take 1 tab (0.6 mg) in 1 hour if symptoms), Disp: 30 tablet, Rfl: 3   Continuous Blood Gluc Sensor (FREESTYLE LIBRE 2 SENSOR) MISC, USE 1 (ONE) EACH EVERY 2 WEEKS, Disp: 2 each, Rfl: 11   Dulaglutide (TRULICITY) 1.5 SW/9.6PR SOPN, Inject 1.5 mg into the skin once a week., Disp: 2 mL, Rfl: 6   empagliflozin (JARDIANCE) 10 MG TABS tablet, Take 1 tablet (10 mg total) by mouth daily before breakfast., Disp: 90 tablet, Rfl: 3    ferrous sulfate 324 MG TBEC, Take 1 tablet (324 mg total) by mouth 2 (two) times daily as needed., Disp: 60 tablet, Rfl: 3   FLUoxetine (PROZAC) 20 MG capsule, TAKE 1 CAPSULE (20 MG TOTAL) BY MOUTH AT BEDTIME., Disp: 30 capsule, Rfl: 0   folic acid (FOLVITE) 1 MG tablet, TAKE 1 TABLET (1 MG TOTAL) BY MOUTH DAILY., Disp: 90 tablet, Rfl: 1   hydrALAZINE (APRESOLINE) 50 MG tablet, Take 1.5 tablets (75 mg total) by mouth 3 (three) times daily., Disp: 140 tablet, Rfl: 4   insulin glargine (LANTUS) 100 UNIT/ML Solostar Pen, Inject 30 Units into the skin daily., Disp: 30 mL, Rfl: 6   insulin lispro (HUMALOG) 100 UNIT/ML KwikPen, Inject 8 Units into the skin 3 (three) times daily with meals., Disp: 15 mL, Rfl: 0   levothyroxine (SYNTHROID) 50 MCG tablet, TAKE 1 TABLET (50 MCG TOTAL) BY MOUTH DAILY (AM), Disp: 30 tablet, Rfl: 0   losartan (COZAAR) 25 MG tablet, Take 2 tablets (50 mg total) by mouth daily. (Patient taking differently: Take 50 mg by mouth at bedtime.), Disp: 180 tablet, Rfl: 0   Omega-3 Fatty Acids (FISH OIL PO), Take 1 capsule by mouth daily., Disp: , Rfl:    ondansetron (ZOFRAN) 4 MG tablet, Take 1 tablet (4 mg total) by mouth every 8 (eight) hours as needed for nausea or vomiting. (Patient not taking: Reported on 03/16/2022), Disp: 20 tablet, Rfl: 0   pantoprazole (PROTONIX) 40 MG tablet, TAKE 1 TABLET (40 MG TOTAL) BY MOUTH DAILY(AM), Disp: 30 tablet, Rfl: 0   potassium chloride SA (KLOR-CON M20) 20 MEQ tablet, Take 1 tablet (20 mEq total) by mouth daily., Disp: 90 tablet, Rfl: 3   torsemide (DEMADEX) 20 MG tablet, Take 4 tablets (80 mg total) by mouth 2 (two) times daily., Disp: 210 tablet, Rfl: 4   VITAMIN D PO, Take 1 tablet by mouth daily., Disp: , Rfl:  Allergies  Allergen Reactions   Gabapentin Nausea And Vomiting and Other (See Comments)    POSSIBLE SHAKING   Lyrica [Pregabalin] Other (See Comments)    Shaking       Social History   Socioeconomic History   Marital status:  Single    Spouse name: Not on file   Number of children: 1   Years of education: 12   Highest education level: 12th grade  Occupational History   Occupation: disabled  Tobacco Use   Smoking status: Former    Packs/day: 1.00  Years: 44.00    Pack years: 44.00    Types: E-cigarettes, Cigarettes   Smokeless tobacco: Former    Types: Snuff  Vaping Use   Vaping Use: Former   Devices: 11/26/2018 "stopped months ago"  Substance and Sexual Activity   Alcohol use: Yes    Comment: occ   Drug use: Yes    Types: "Crack" cocaine, Marijuana, Oxycodone   Sexual activity: Not Currently  Other Topics Concern   Not on file  Social History Narrative   ** Merged History Encounter **       Lives in [place], in motel with sister.  They are looking to move but don't have a place to go yet.     Social Determinants of Health   Financial Resource Strain: Not on file  Food Insecurity: Not on file  Transportation Needs: Not on file  Physical Activity: Not on file  Stress: Not on file  Social Connections: Not on file  Intimate Partner Violence: Not on file    Physical Exam      Future Appointments  Date Time Provider [place]  04/04/2022  2:00 PM Larey Dresser, MD [facility] None  01/11/2023  9:00 AM [facility] 3 [facility] [place], [facility] Paramedic  03/31/22

## 2022-04-04 ENCOUNTER — Other Ambulatory Visit (HOSPITAL_COMMUNITY): Payer: Self-pay

## 2022-04-04 ENCOUNTER — Encounter (HOSPITAL_COMMUNITY): Payer: Self-pay | Admitting: Cardiology

## 2022-04-04 ENCOUNTER — Ambulatory Visit (HOSPITAL_COMMUNITY)
Admission: RE | Admit: 2022-04-04 | Discharge: 2022-04-04 | Disposition: A | Discharge status: Home / Self Care | Payer: Medicare Other | Source: Ambulatory Visit | Attending: Cardiology | Admitting: Cardiology

## 2022-04-04 VITALS — BP 110/70 | HR 86 | Wt 222.8 lb

## 2022-04-04 DIAGNOSIS — K7460 Unspecified cirrhosis of liver: Secondary | ICD-10-CM | POA: Insufficient documentation

## 2022-04-04 DIAGNOSIS — J449 Chronic obstructive pulmonary disease, unspecified: Secondary | ICD-10-CM | POA: Diagnosis not present

## 2022-04-04 DIAGNOSIS — E1151 Type 2 diabetes mellitus with diabetic peripheral angiopathy without gangrene: Secondary | ICD-10-CM | POA: Diagnosis not present

## 2022-04-04 DIAGNOSIS — Z7984 Long term (current) use of oral hypoglycemic drugs: Secondary | ICD-10-CM | POA: Insufficient documentation

## 2022-04-04 DIAGNOSIS — E038 Other specified hypothyroidism: Secondary | ICD-10-CM | POA: Diagnosis not present

## 2022-04-04 DIAGNOSIS — I130 Hypertensive heart and chronic kidney disease with heart failure and stage 1 through stage 4 chronic kidney disease, or unspecified chronic kidney disease: Secondary | ICD-10-CM | POA: Diagnosis not present

## 2022-04-04 DIAGNOSIS — I480 Paroxysmal atrial fibrillation: Secondary | ICD-10-CM

## 2022-04-04 DIAGNOSIS — E1122 Type 2 diabetes mellitus with diabetic chronic kidney disease: Secondary | ICD-10-CM | POA: Insufficient documentation

## 2022-04-04 DIAGNOSIS — F17200 Nicotine dependence, unspecified, uncomplicated: Secondary | ICD-10-CM | POA: Insufficient documentation

## 2022-04-04 DIAGNOSIS — E1169 Type 2 diabetes mellitus with other specified complication: Secondary | ICD-10-CM

## 2022-04-04 DIAGNOSIS — L97519 Non-pressure chronic ulcer of other part of right foot with unspecified severity: Secondary | ICD-10-CM | POA: Insufficient documentation

## 2022-04-04 DIAGNOSIS — Z79890 Hormone replacement therapy (postmenopausal): Secondary | ICD-10-CM | POA: Insufficient documentation

## 2022-04-04 DIAGNOSIS — E785 Hyperlipidemia, unspecified: Secondary | ICD-10-CM

## 2022-04-04 DIAGNOSIS — I5032 Chronic diastolic (congestive) heart failure: Secondary | ICD-10-CM | POA: Diagnosis not present

## 2022-04-04 DIAGNOSIS — Z79899 Other long term (current) drug therapy: Secondary | ICD-10-CM | POA: Insufficient documentation

## 2022-04-04 DIAGNOSIS — Z7901 Long term (current) use of anticoagulants: Secondary | ICD-10-CM | POA: Insufficient documentation

## 2022-04-04 DIAGNOSIS — I5042 Chronic combined systolic (congestive) and diastolic (congestive) heart failure: Secondary | ICD-10-CM | POA: Diagnosis not present

## 2022-04-04 DIAGNOSIS — I739 Peripheral vascular disease, unspecified: Secondary | ICD-10-CM | POA: Diagnosis not present

## 2022-04-04 DIAGNOSIS — I2510 Atherosclerotic heart disease of native coronary artery without angina pectoris: Secondary | ICD-10-CM | POA: Diagnosis not present

## 2022-04-04 DIAGNOSIS — E11621 Type 2 diabetes mellitus with foot ulcer: Secondary | ICD-10-CM | POA: Diagnosis not present

## 2022-04-04 DIAGNOSIS — G4733 Obstructive sleep apnea (adult) (pediatric): Secondary | ICD-10-CM | POA: Diagnosis not present

## 2022-04-04 LAB — COMPREHENSIVE METABOLIC PANEL
ALT: 22 U/L (ref 0–44)
AST: 17 U/L (ref 15–41)
Albumin: 3.7 g/dL (ref 3.5–5.0)
Alkaline Phosphatase: 119 U/L (ref 38–126)
Anion gap: 17 — ABNORMAL HIGH (ref 5–15)
BUN: 56 mg/dL — ABNORMAL HIGH (ref 6–20)
CO2: 25 mmol/L (ref 22–32)
Calcium: 9.2 mg/dL (ref 8.9–10.3)
Chloride: 89 mmol/L — ABNORMAL LOW (ref 98–111)
Creatinine, Ser: 2.24 mg/dL — ABNORMAL HIGH (ref 0.44–1.00)
GFR, Estimated: 25 mL/min — ABNORMAL LOW (ref >60)
Glucose, Bld: 444 mg/dL — ABNORMAL HIGH (ref 70–99)
Potassium: 3.6 mmol/L (ref 3.5–5.1)
Sodium: 131 mmol/L — ABNORMAL LOW (ref 135–145)
Total Bilirubin: 0.3 mg/dL (ref 0.3–1.2)
Total Protein: 7.2 g/dL (ref 6.5–8.1)

## 2022-04-04 LAB — LIPID PANEL
Cholesterol: 198 mg/dL (ref 0–200)
HDL: 54 mg/dL (ref >40)
LDL Cholesterol: UNDETERMINED mg/dL (ref 0–99)
Total CHOL/HDL Ratio: 3.7 RATIO
Triglycerides: 405 mg/dL — ABNORMAL HIGH (ref <150)
VLDL: UNDETERMINED mg/dL (ref 0–40)

## 2022-04-04 LAB — TSH: TSH: 3.109 u[IU]/mL (ref 0.350–4.500)

## 2022-04-04 LAB — BRAIN NATRIURETIC PEPTIDE: B Natriuretic Peptide: 81.3 pg/mL (ref 0.0–100.0)

## 2022-04-04 LAB — LDL CHOLESTEROL, DIRECT: Direct LDL: 90.8 mg/dL (ref 0–99)

## 2022-04-04 MED ORDER — SPIRONOLACTONE 25 MG PO TABS: 12.5000 mg | ORAL_TABLET | Freq: Every day | ORAL | 3 refills | Status: DC

## 2022-04-04 NOTE — Progress Notes (Signed)
Paramedicine Encounter   Patient ID: [name] , female,   DOB: 05-08-1961,60 y.o.,  MRN: [id number]  Pt reports still having dry heaves sometimes.  She reports her legs are no longer weeping like last week.  She has a round red spot around a scratch she has on her arm that has been there for a few wks she said.  She denies is being a scratch from the cat.  She needs to be seen in vascular again soon to assess the circulation better.  Cardiomems levels were at goal a few days ago.  Labs done today.   Met patient in clinic today with provider.  Weight @ clinic-222 B/P-110/70 P-86 SP02-92 REDS CLIP-32%  Med changes-remove potassium and add on spiro 1/2 tablet    Marylouise Stacks, EMT-Paramedic [phone] 04/04/2022

## 2022-04-04 NOTE — Patient Instructions (Signed)
EKG done today.  RedsClip done today.  Labs done today, your results will be available in MyChart, we will contact you for abnormal readings.  STOP Taking Potassium  START Spironolactone 12.5mg  (1/2 tablet) by mouth daily.   Your physician recommends that you return for lab work in: 10 days  Your physician has requested that you have a carotid duplex. This test is an ultrasound of the carotid arteries in your neck. It looks at blood flow through these arteries that supply the brain with blood. Allow one hour for this exam. There are no restrictions or special instructions. This has to be approved through your insurance company prior to scheduling, once approved we will contact you to schedule an appointment.   Your physician recommends that you schedule a follow-up appointment in: 1 month with our NP/PA Clinic here in our office.  If you have any questions or concerns before your next appointment please send [place] a message through [name] or call our office at [phone].    TO LEAVE A MESSAGE FOR THE NURSE SELECT OPTION 2, PLEASE LEAVE A MESSAGE INCLUDING: YOUR NAME DATE OF BIRTH CALL BACK NUMBER REASON FOR CALL**this is important as we prioritize the call backs  YOU WILL RECEIVE A CALL BACK THE SAME DAY AS LONG AS YOU CALL BEFORE 4:00 PM  Do the following things EVERYDAY: Weigh yourself in the morning before breakfast. Write it down and keep it in a log. Take your medicines as prescribed Eat low salt foods--Limit salt (sodium) to 2000 mg per day.  Stay as active as you can everyday Limit all fluids for the day to less than 2 liters   At the [facility], you and your health needs are our priority. As part of our continuing mission to provide you with exceptional heart care, we have created designated Provider Care Teams. These Care Teams include your primary Cardiologist (physician) and Advanced Practice Providers (APPs- Physician Assistants and Nurse  Practitioners) who all work together to provide you with the care you need, when you need it.   You may see any of the following providers on your designated Care Team at your next follow up: Dr Glori Bickers Dr Haynes Kerns, NP Lyda Jester, [place] [facility] [place], [place] Audry Riles, PharmD   Please be sure to bring in all your medications bottles to every appointment.

## 2022-04-05 NOTE — Progress Notes (Signed)
Patient ID: [name], female   DOB: Mar 09, 1961, 61 y.o.   MRN: [id number]    Advanced Heart Failure Clinic Note   PCP: [facility] PV: Dr. Gwenlyn Found HF Cardiology: Dr. Aundra Dubin  [name] is a 61 y.o. with history of PAD, carotid stenosis s/p left CEA, relatively mild CAD, chronic systolic CHF, paroxysmal atrial fibrillation and prior substance abuse presents for CHF clinic followup.  She was admitted in 1/17 with acute hypoxemic respiratory failure in the setting of atrial fibrillation/RVR and volume overload.  She was initially intubated.  She converted back to NSR with amiodarone gtt.  She was treated with IV Lasix, steroids, bronchodilators.  She developed AKI and losartan was stopped.    She was admitted in 3/17 with symptomatic bradycardia.  She was supposed to stop diltiazem after this admission but continued to take it.  She presented later in 3/17, again with symptomatic bradycardia (junctional bradycardia), hypotension, and AKI.  Diltiazem and bisoprolol were stopped.  HR recovered and she has had no problems since.  ACEI was stopped with AKI.   In 5/18, she had peripheral angiography with atherectomy of right SFA.  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 7/18 ABIs showed 0.65 on right, 0.31 on left.  In 8/20, she had peripheral angiography showing totally occluded left SFA.  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 Donnetta Hutching.  ABIs in 2/21 were 0.44 on right, 0.99 on left.   Echo in 1/21 showed EF 50%, mild LVH, normal RV, PASP 60 mmHg.   Echo 03/08/21 EF 55-60% with basal to mid inferolateral hypokinesis.   Admitted 6/22 for mechanical fall, found to have AKI on CKD. She was given IV lasix and her losartan was held.  Underwent PV angiogram with Dr. Gwenlyn Found 7/22. She had drug-coated balloon angioplasty of the right SFA after being admitted for critical ischemia of the right foot.  Repeat peripheral  arterial dopplers in 8/22 still showed severe right SFA disease.   Admitted 8/22 for bradycardia and AKI, bisoprolol subsequently stopped; amiodarone was continued.  Seen in ED 9/22 for N/V w/ black tarry stools after taking Pepto-Bismal, losartan stopped for unclear reasons.  She returns today followup of CHF.  Weight up 4 lbs.  She quit smoking in 2/23.  She has pain in both legs and feet with ambulation.  She has an ulcer on her right foot. She is not very mobile, does not get short of breath walking short distances but fatigued, short of breath, and with leg pain if walks longer distances. No chest pain. No lightheadedness.   ECG (personally reviewed): NSR, septal Qs  ReDs: 32%  Cardiomems: 04/01/22 PADP 18 (goal 20).  Labs (1/17): K 5.2, creatinine 1.4, AST 43, ALT normal Labs (2/17): K 4.2, creatinine 1.3, BNP 607, TSH normal Labs (3/17): K 4.2, creatinine 1.45, AST/ALT normal, HCT 28.7 Labs (4/17): K 4.4, creatinine 1.61 Labs (08/31/2016): K 4.5 Creatinine 1.43  Labs (11/17): K 4.2, creatinine 1.4 Labs (9/18): K 4.1, creatinine 1.1, TSH normal Labs 04/13/2018: K 5 Creatinine 1.75 Labs (2/21): K 4.1, creatinine 1.58 Labs (4/22): K 3.7, creatinine 1.55 Labs (5/22): K 3.8, creatinine 1.54 Labs (8/22): K 4.0, creatinine 2.07 Labs (11/22): TSH normal, LDL 59 Labs (12/22): K 3.6, creatinine 1.28, BNP 256, AST/ALT normal Labs (3/23): K 4.3, creatinine 1.82 => 1.7, hgb 11  PMH: 1. Carotid stenosis: Known occluded right carotid.  Left CEA  in 4/16.  - Carotid dopplers (7/21): CTO RICA, mild disease LICA.  - Carotid dopplers (7/22): CTO RICA, 2-62% LICA.  2. CAD: LHC in 12/15 with 80% stenosis in small OM1, nonobstructive disease in other territories.  3. Chronic systolic CHF: Nonischemic cardiomyopathy (?due to ETOH or prior drug abuse).  She has a Cardiomems.  - Echo (1/17) with EF 45%, mild LV hypertrophy, moderate diastolic dysfunction, inferolateral severe hypokinesis, mildly  decreased RV systolic function.  - Echo (1/18): EF 40-45%, mild LVH, normal RV size and systolic function.  - Echo (1/21): EF 50%, mild LVH, normal RV, PASP 60 mmHg.  - Echo (4/22): EF 55-60%, basal-mid inferolateral hypokinesis with grade II diastolic dysfunction, RV normal, PASP 42 4. Atrial fibrillation: Paroxysmal.   5. Type II diabetes 6. CKD: Stage III. 7. COPD: Smokes 1 ppd.  8. 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  No longer drinks.  9. Hypothyroidism 10. PAD: Atherectomy SFA in 2014 (Dr Gwenlyn Found).  Peripheral arterial dopplers (2/17) with focal 75-99% proximal right SFA stenosis, occluded mid-distal right SFA, chronic occlusion of all runoff arteries on right. - In 5/18, she had peripheral angiography with atherectomy of right SFA.   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  - 7/18 ABIs showed 0.65 on right, 0.31 on left.   - Peripheral angiography 8/20: Totally occluded left SFA.  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 Donnetta Hutching.   - ABIs (2/21): 0.44 R, 0.99 L.  - Balloon angioplasty right SFA 7/22.  - ABIs (8/22): severe right SFA disease.  11. Anemia 12. Prior cocaine abuse.  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  14. OSA: Has not been able to tolerate CPAP.  15. Diabetic neuropathy.   SH: Lives with sister.  Prior cocaine abuse.  Prior ETOH abuse.  Quit smoking in 1/17, restarted, and quit again in 4/19, restarted again.  FH: CAD  Review of systems complete and found to be negative unless listed in HPI.    Current Outpatient Medications  Medication Sig Dispense Refill   ACCU-CHEK GUIDE test strip USE TO CHECK BLOOD SUGAR FOUR TIMES DAILY     allopurinol (ZYLOPRIM) 100 MG tablet TAKE 1 TABLET (100 MG TOTAL) BY MOUTH DAILY(AM) 30 tablet 0   amiodarone (PACERONE) 200 MG tablet TAKE 1/2 TABLET (100 MG TOTAL) BY MOUTH DAILY. 15 tablet 3   amLODipine (NORVASC) 10 MG  tablet Take 1 tablet (10 mg total) by mouth daily. 90 tablet 3   apixaban (ELIQUIS) 5 MG TABS tablet Take 1 tablet (5 mg total) by mouth 2 (two) times daily. 60 tablet 8   atorvastatin (LIPITOR) 40 MG tablet TAKE 1 TABLET (40 MG TOTAL) BY MOUTH EVERY EVENING (BEDTIME) 30 tablet 0   buPROPion (WELLBUTRIN SR) 150 MG 12 hr tablet Take 150 mg by mouth 2 (two) times daily.     empagliflozin (JARDIANCE) 10 MG TABS tablet Take 1 tablet (10 mg total) by mouth daily before breakfast. 90 tablet 3   ferrous sulfate 324 MG TBEC Take 1 tablet (324 mg total) by mouth 2 (two) times daily as needed. 60 tablet 3   FLUoxetine (PROZAC) 20 MG capsule TAKE 1 CAPSULE (20 MG TOTAL) BY MOUTH AT BEDTIME. 30 capsule 0   folic acid (FOLVITE) 1 MG tablet TAKE 1 TABLET (1 MG TOTAL) BY MOUTH DAILY. 90 tablet 1   hydrALAZINE (APRESOLINE) 50 MG tablet Take 1.5  tablets (75 mg total) by mouth 3 (three) times daily. 140 tablet 4   levothyroxine (SYNTHROID) 50 MCG tablet TAKE 1 TABLET (50 MCG TOTAL) BY MOUTH DAILY (AM) 30 tablet 0   losartan (COZAAR) 25 MG tablet Take 2 tablets (50 mg total) by mouth daily. (Patient taking differently: Take 50 mg by mouth at bedtime.) 180 tablet 0   pantoprazole (PROTONIX) 40 MG tablet TAKE 1 TABLET (40 MG TOTAL) BY MOUTH DAILY(AM) 30 tablet 0   spironolactone (ALDACTONE) 25 MG tablet Take 0.5 tablets (12.5 mg total) by mouth daily. 45 tablet 3   torsemide (DEMADEX) 20 MG tablet Take 4 tablets (80 mg total) by mouth 2 (two) times daily. 210 tablet 4   acetaminophen (TYLENOL) 500 MG tablet Take 500 mg by mouth every 6 (six) hours as needed for moderate pain.     albuterol (VENTOLIN HFA) 108 (90 Base) MCG/ACT inhaler Inhale 2 puffs into the lungs every 6 (six) hours as needed for wheezing or shortness of breath. 18 g 3   budesonide-formoterol (SYMBICORT) 160-4.5 MCG/ACT inhaler Inhale 2 puffs into the lungs 2 (two) times daily. 1 Inhaler 2   colchicine 0.6 MG tablet TAKE 2 TABS (1.2 MG) AT THE ONSET OF  A GOUT FLARE, MAY TAKE 1 TAB (0.6 MG) IN 1 HOUR IF SYMPTOMS (Patient taking differently: daily. Take 2 tabs (1.2 mg) at the onset of a gout flare, may take 1 tab (0.6 mg) in 1 hour if symptoms) 30 tablet 3   Continuous Blood Gluc Sensor (FREESTYLE LIBRE 2 SENSOR) MISC USE 1 (ONE) EACH EVERY 2 WEEKS 2 each 11   Dulaglutide (TRULICITY) 1.5 GL/8.7FI SOPN Inject 1.5 mg into the skin once a week. 2 mL 6   insulin glargine (LANTUS) 100 UNIT/ML Solostar Pen Inject 30 Units into the skin daily. 30 mL 6   insulin lispro (HUMALOG) 100 UNIT/ML KwikPen Inject 8 Units into the skin 3 (three) times daily with meals. 15 mL 0   Omega-3 Fatty Acids (FISH OIL PO) Take 1 capsule by mouth daily.     ondansetron (ZOFRAN) 4 MG tablet Take 1 tablet (4 mg total) by mouth every 8 (eight) hours as needed for nausea or vomiting. (Patient not taking: Reported on 03/16/2022) 20 tablet 0   VITAMIN D PO Take 1 tablet by mouth daily.     No current facility-administered medications for this encounter.   BP 110/70   Pulse 86   Wt 101.1 kg (222 lb 12.8 oz)   LMP  (LMP Unknown)   SpO2 92%   BMI 38.24 kg/m   Wt Readings from Last 3 Encounters:  04/04/22 101.1 kg (222 lb 12.8 oz)  03/31/22 99.8 kg (220 lb)  02/17/22 101.6 kg (224 lb)    General: NAD Neck: JVP ?8 cm, no thyromegaly or thyroid nodule.  Lungs: Occasional wheezes CV: Nondisplaced PMI.  Heart regular S1/S2, no S3/S4, no murmur.  1+ ankle edema.  No carotid bruit.  Unable to palpate pedal pulses.  Abdomen: Soft, nontender, no hepatosplenomegaly, no distention.  Skin: Intact without lesions or rashes.  Neurologic: Alert and oriented x 3.  Psych: Normal affect. Extremities: No clubbing or cyanosis.  HEENT: Normal.   Assessment/Plan: 1. Chronic diastolic CHF: LHC in 43/32 showing only 80% stenosis in small OM1. EF as low as 40-45% in the past. Echo 4/22 showed EF 55-60% with grade 2 diastolic dysfunction.   Not very active, NYHA class III symptoms. She is not  significantly volume overloaded by REDS  clip, Cardiomems, or exam.  - Continue to use Cardiomems regularly. - Continue torsemide 80 mg bid, BMET/BNP today.  - Continue losartan 50 mg daily.  - Continue Jardiance 10 mg daily. No GU symptoms. - Off bisoprolol due to bradycardia.   - Add spironolactone 12.5 mg daily and stop KCl.  BMET 10 days.  2. Atrial fibrillation: Paroxysmal. Episode of Afib w/ RVR during 07/29/21 ED visit, she spontaneously converted. NSR today.  - Continue amiodarone 100 mg daily. Check LFTs/ TFTs.  She is on levothyroxine, followed by PCP.  Needs regular eye exam,  - Continue Eliquis 5 mg bid.   3. PAD: Claudication bilaterally.  Not candidate for cilostazol with CHF.  She has had a left fem-pop bypass in 8/20.  ABIs in 2/21 were very abnormal on right. She underwent PV angiogram with balloon angioplasty of the right SFA 7/22.  8/22 dopplers still showed severe right SFA disease and she had a right foot ulceration and was being seen in [facility]. Dopplers 2/23 showed patent right SFA.  She has been having increased leg pain recently, also with ulcer on right foot. Leg pain probably not all claudication, also has diabetic neuropathy.  - She is followed by Dr. Gwenlyn Found, think she needs a followup with him.  - Continue atorvastatin. LDL at goal in 11/22.  - She is now off Plavix. 4. COPD:  Congratulated on quitting smoking.  5. H/o cirrhosis: Likely from ETOH, she has quit.  6. OSA: Not able to tolerate CPAP.   7. CKD stage III:  Check BMET.  8. HTN: Controlled.   9.Type 2 diabetes: Per PCP.   10. CAD: LHC in 12/15 with 80% stenosis in small OM1, nonobstructive disease in other territories. No chest pain. - Continue statin.  - Continue Eliquis. 11. Carotid stenosis: Due for carotid dopplers in 7/23.   Followup 1 month with APP.   Loralie Champagne, MD 04/05/2022

## 2022-04-06 ENCOUNTER — Telehealth (HOSPITAL_COMMUNITY): Payer: Self-pay | Admitting: *Deleted

## 2022-04-06 ENCOUNTER — Other Ambulatory Visit (HOSPITAL_COMMUNITY): Payer: Self-pay | Admitting: *Deleted

## 2022-04-06 DIAGNOSIS — I5042 Chronic combined systolic (congestive) and diastolic (congestive) heart failure: Secondary | ICD-10-CM

## 2022-04-06 DIAGNOSIS — E119 Type 2 diabetes mellitus without complications: Secondary | ICD-10-CM | POA: Diagnosis not present

## 2022-04-06 DIAGNOSIS — R748 Abnormal levels of other serum enzymes: Secondary | ICD-10-CM

## 2022-04-06 DIAGNOSIS — H524 Presbyopia: Secondary | ICD-10-CM | POA: Diagnosis not present

## 2022-04-06 DIAGNOSIS — Z79899 Other long term (current) drug therapy: Secondary | ICD-10-CM

## 2022-04-06 MED ORDER — TORSEMIDE 20 MG PO TABS: ORAL_TABLET | ORAL | 4 refills | Status: DC

## 2022-04-06 NOTE — Telephone Encounter (Signed)
-----   Message from Larey Dresser, MD sent at 04/04/2022  4:00 PM EDT ----- High glucose, contact PCP.  Creatinine higher, decrease torsemide to 60 mg bid x 2 days then 80 qam/60 qpm after that. BMET 1 week.

## 2022-04-06 NOTE — Addendum Note (Signed)
Addended by: Zada Girt B on: 04/06/2022 03:16 PM   Modules accepted: Orders

## 2022-04-06 NOTE — Telephone Encounter (Signed)
Called patient and left message per Dr. Aundra Dubin re: lab results from 04/04/22. Blood sugar was high at 444; asked that she follow up with her PCP. Kidney function (creat) was elevated; asked that she decrease Torsemide to 60 mg twice daily for 2 days then decrease to 80 mg am and 60 mg pm. Repeat labs (BMP) in one week. Lab appt scheduled for 04/15/22 at 10:00 am. Asked her to call HF office if she needs to re-schedule or has any questions. Spoke with Marine scientist) about above as well.   [facility] [phone]

## 2022-04-07 ENCOUNTER — Other Ambulatory Visit: Payer: Self-pay | Admitting: Family Medicine

## 2022-04-07 ENCOUNTER — Other Ambulatory Visit (HOSPITAL_COMMUNITY): Payer: Self-pay | Admitting: Cardiology

## 2022-04-07 ENCOUNTER — Other Ambulatory Visit (HOSPITAL_COMMUNITY): Payer: Self-pay

## 2022-04-07 ENCOUNTER — Other Ambulatory Visit (HOSPITAL_COMMUNITY): Payer: Self-pay | Admitting: Adult Health

## 2022-04-07 NOTE — Progress Notes (Signed)
Came out for med rec post lab changes.  60mg  for 2 days and then to take 80mg  AM and 60mg  in afternoon.  Confirmed to pharmacy that they have this update.  They will send bubble packs tomor.   Advised her to contact her PCP ref her elevated CBG.  She will make appointment.    Marylouise Stacks, [place] 04/07/2022

## 2022-04-08 ENCOUNTER — Other Ambulatory Visit: Payer: Self-pay | Admitting: Family Medicine

## 2022-04-12 DIAGNOSIS — I739 Peripheral vascular disease, unspecified: Secondary | ICD-10-CM | POA: Diagnosis not present

## 2022-04-12 DIAGNOSIS — E1122 Type 2 diabetes mellitus with diabetic chronic kidney disease: Secondary | ICD-10-CM | POA: Diagnosis not present

## 2022-04-12 DIAGNOSIS — N2581 Secondary hyperparathyroidism of renal origin: Secondary | ICD-10-CM | POA: Diagnosis not present

## 2022-04-12 DIAGNOSIS — N1832 Chronic kidney disease, stage 3b: Secondary | ICD-10-CM | POA: Diagnosis not present

## 2022-04-12 DIAGNOSIS — R600 Localized edema: Secondary | ICD-10-CM | POA: Diagnosis not present

## 2022-04-12 DIAGNOSIS — R110 Nausea: Secondary | ICD-10-CM | POA: Diagnosis not present

## 2022-04-12 DIAGNOSIS — I5030 Unspecified diastolic (congestive) heart failure: Secondary | ICD-10-CM | POA: Diagnosis not present

## 2022-04-12 DIAGNOSIS — I129 Hypertensive chronic kidney disease with stage 1 through stage 4 chronic kidney disease, or unspecified chronic kidney disease: Secondary | ICD-10-CM | POA: Diagnosis not present

## 2022-04-13 IMAGING — DX DG CHEST 2V
2 series · 2 of 2 positions shown · non-contrast
Comparison: 06/13/2021

CLINICAL DATA: Cough and dyspnea

EXAM:
CHEST - 2 VIEW

[chest pa]
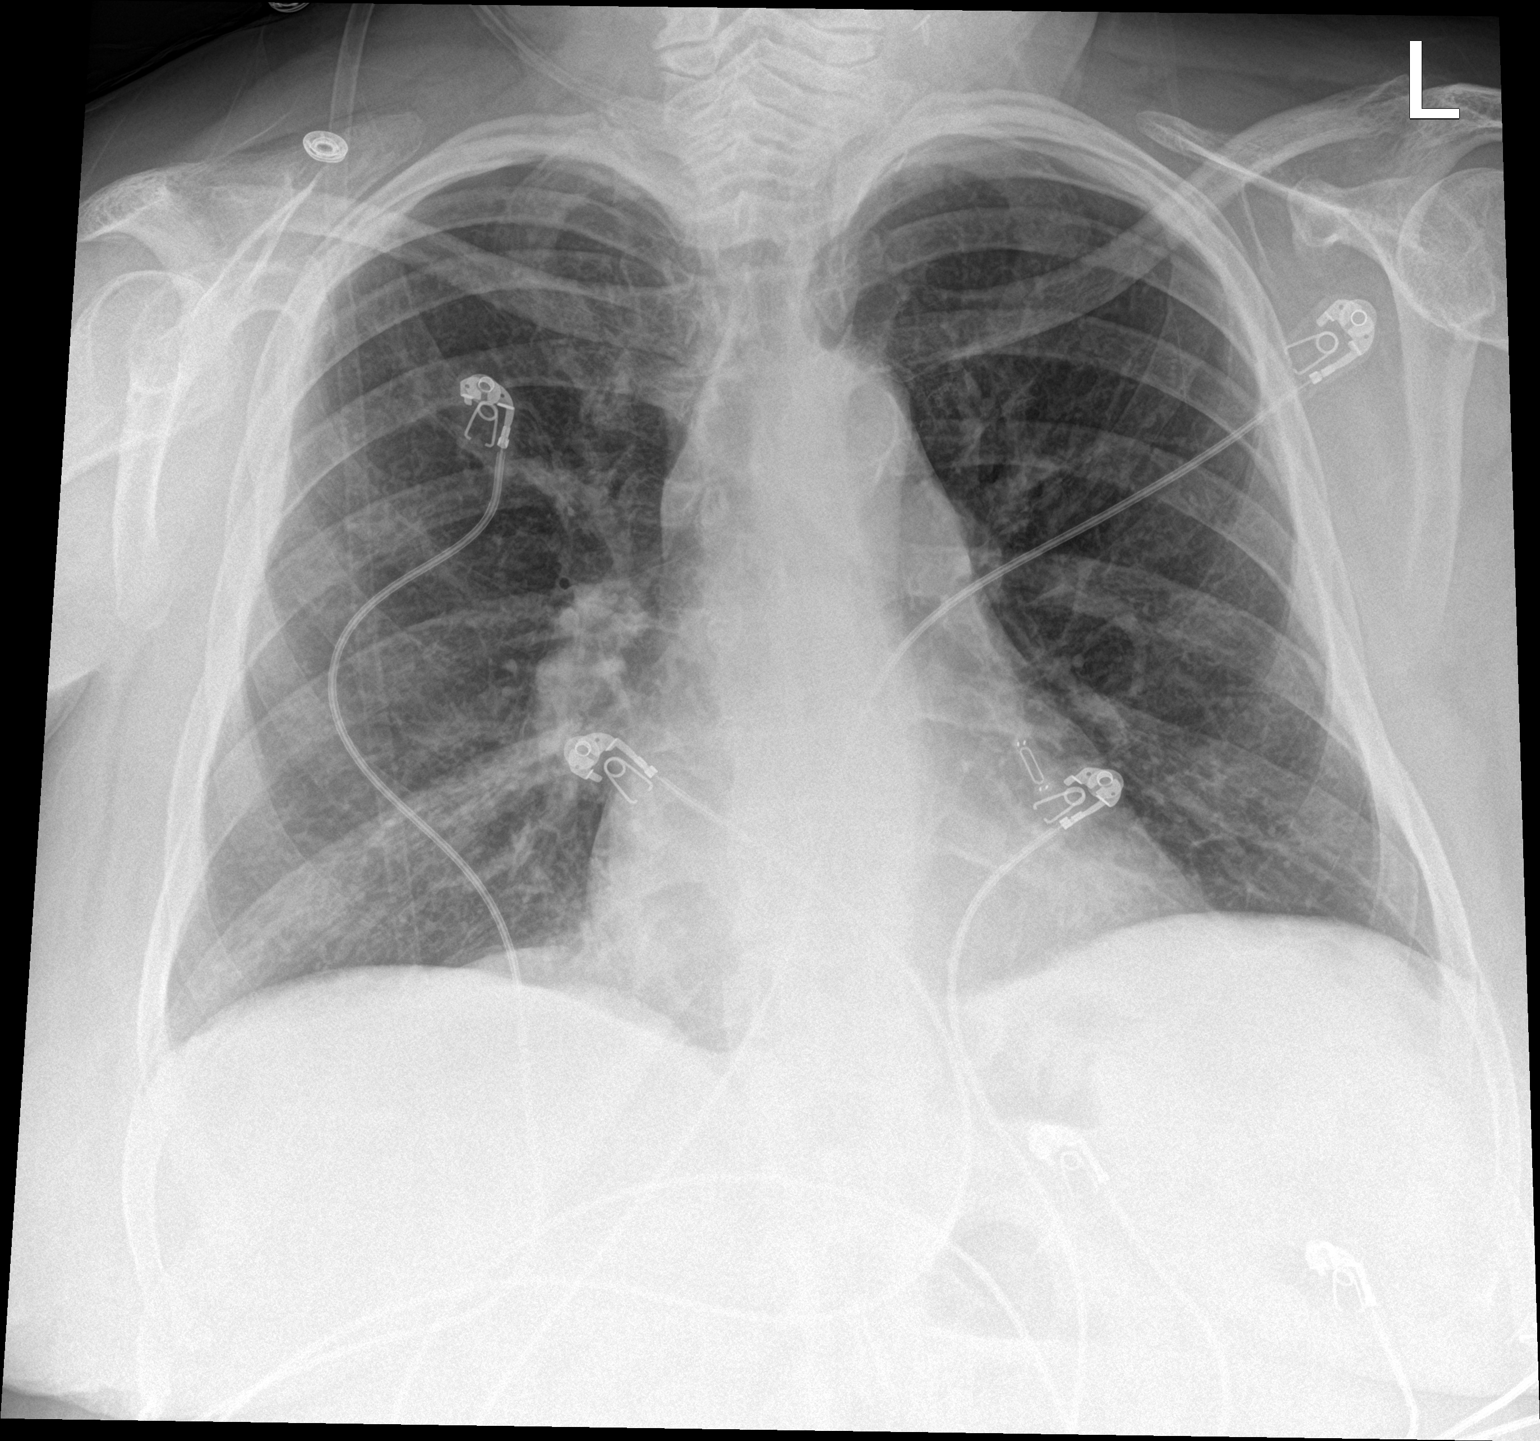

[chest lat]
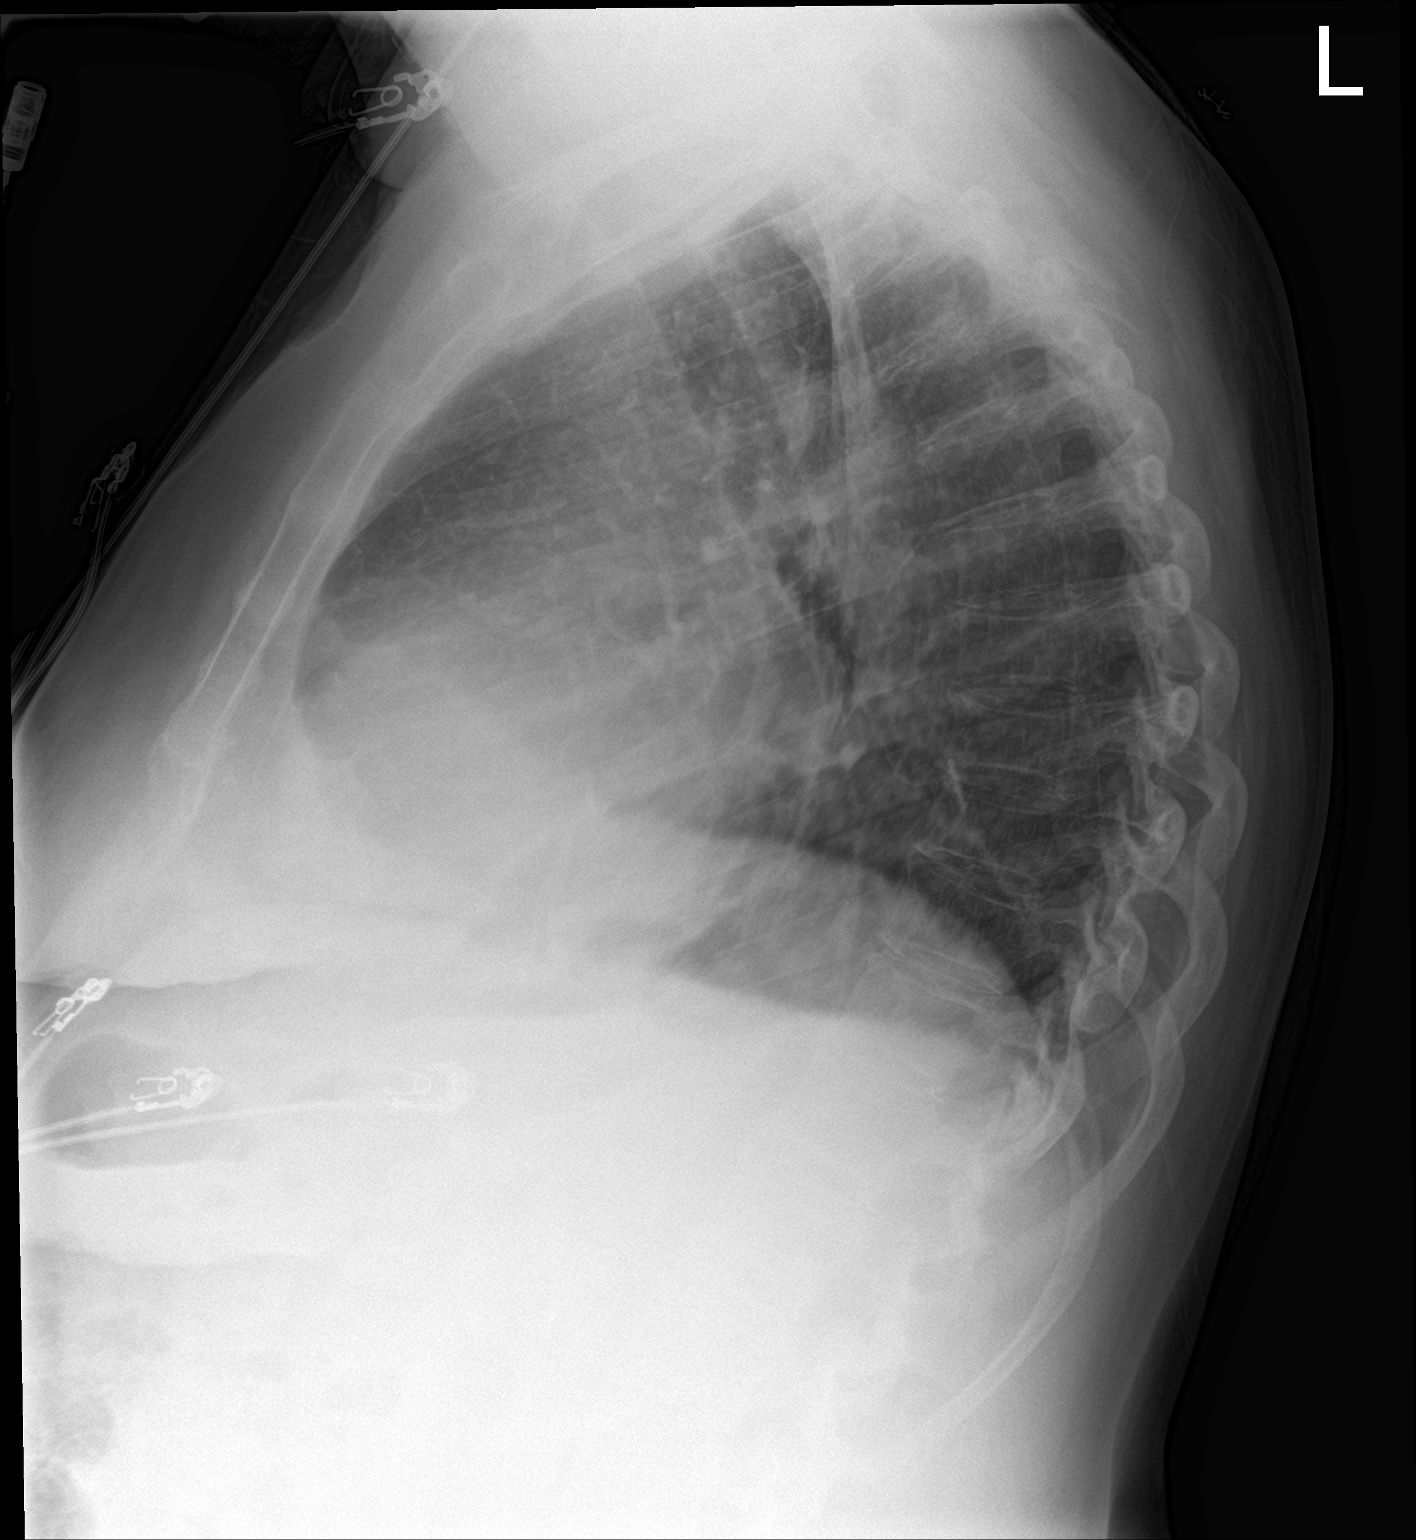

[2 of 2 positions shown; findings below may reference images not displayed]

FINDINGS: Atherosclerotic calcification of the aortic arch. CardioMEMS device
projects over the left lower lobe pulmonary artery. The lungs appear
otherwise clear and there is no current evidence of pulmonary edema.
No blunting of the costophrenic angles. Heart size within normal
limits.
IMPRESSION: 1.  No active cardiopulmonary disease is radiographically apparent.

## 2022-04-15 ENCOUNTER — Other Ambulatory Visit (HOSPITAL_COMMUNITY): Payer: Medicare Other

## 2022-04-17 ENCOUNTER — Inpatient Hospital Stay (HOSPITAL_COMMUNITY)
Admission: EM | Admit: 2022-04-17 | Discharge: 2022-04-22 | DRG: 872 | Disposition: A | Discharge status: Home / Self Care | Payer: Medicare Other | Attending: Internal Medicine | Admitting: Internal Medicine

## 2022-04-17 ENCOUNTER — Encounter (HOSPITAL_COMMUNITY): Payer: Self-pay

## 2022-04-17 DIAGNOSIS — E860 Dehydration: Secondary | ICD-10-CM | POA: Diagnosis present

## 2022-04-17 DIAGNOSIS — R059 Cough, unspecified: Secondary | ICD-10-CM | POA: Diagnosis not present

## 2022-04-17 DIAGNOSIS — R112 Nausea with vomiting, unspecified: Principal | ICD-10-CM

## 2022-04-17 DIAGNOSIS — Z79899 Other long term (current) drug therapy: Secondary | ICD-10-CM

## 2022-04-17 DIAGNOSIS — Z803 Family history of malignant neoplasm of breast: Secondary | ICD-10-CM

## 2022-04-17 DIAGNOSIS — E785 Hyperlipidemia, unspecified: Secondary | ICD-10-CM | POA: Diagnosis not present

## 2022-04-17 DIAGNOSIS — M109 Gout, unspecified: Secondary | ICD-10-CM | POA: Diagnosis present

## 2022-04-17 DIAGNOSIS — I2510 Atherosclerotic heart disease of native coronary artery without angina pectoris: Secondary | ICD-10-CM | POA: Diagnosis not present

## 2022-04-17 DIAGNOSIS — E876 Hypokalemia: Secondary | ICD-10-CM | POA: Diagnosis present

## 2022-04-17 DIAGNOSIS — I130 Hypertensive heart and chronic kidney disease with heart failure and stage 1 through stage 4 chronic kidney disease, or unspecified chronic kidney disease: Secondary | ICD-10-CM | POA: Diagnosis not present

## 2022-04-17 DIAGNOSIS — E871 Hypo-osmolality and hyponatremia: Secondary | ICD-10-CM | POA: Diagnosis present

## 2022-04-17 DIAGNOSIS — A419 Sepsis, unspecified organism: Secondary | ICD-10-CM | POA: Diagnosis not present

## 2022-04-17 DIAGNOSIS — A0839 Other viral enteritis: Secondary | ICD-10-CM | POA: Diagnosis not present

## 2022-04-17 DIAGNOSIS — Z86718 Personal history of other venous thrombosis and embolism: Secondary | ICD-10-CM

## 2022-04-17 DIAGNOSIS — Z7984 Long term (current) use of oral hypoglycemic drugs: Secondary | ICD-10-CM

## 2022-04-17 DIAGNOSIS — E1169 Type 2 diabetes mellitus with other specified complication: Secondary | ICD-10-CM | POA: Diagnosis present

## 2022-04-17 DIAGNOSIS — R0602 Shortness of breath: Secondary | ICD-10-CM | POA: Diagnosis not present

## 2022-04-17 DIAGNOSIS — K529 Noninfective gastroenteritis and colitis, unspecified: Secondary | ICD-10-CM | POA: Diagnosis present

## 2022-04-17 DIAGNOSIS — R109 Unspecified abdominal pain: Secondary | ICD-10-CM | POA: Diagnosis not present

## 2022-04-17 DIAGNOSIS — E1151 Type 2 diabetes mellitus with diabetic peripheral angiopathy without gangrene: Secondary | ICD-10-CM | POA: Diagnosis present

## 2022-04-17 DIAGNOSIS — N1832 Chronic kidney disease, stage 3b: Secondary | ICD-10-CM | POA: Diagnosis not present

## 2022-04-17 DIAGNOSIS — Z794 Long term (current) use of insulin: Secondary | ICD-10-CM

## 2022-04-17 DIAGNOSIS — F32A Depression, unspecified: Secondary | ICD-10-CM | POA: Diagnosis not present

## 2022-04-17 DIAGNOSIS — J449 Chronic obstructive pulmonary disease, unspecified: Secondary | ICD-10-CM | POA: Diagnosis not present

## 2022-04-17 DIAGNOSIS — E039 Hypothyroidism, unspecified: Secondary | ICD-10-CM | POA: Diagnosis not present

## 2022-04-17 DIAGNOSIS — Z8249 Family history of ischemic heart disease and other diseases of the circulatory system: Secondary | ICD-10-CM

## 2022-04-17 DIAGNOSIS — I480 Paroxysmal atrial fibrillation: Secondary | ICD-10-CM | POA: Diagnosis not present

## 2022-04-17 DIAGNOSIS — E66812 Obesity, class 2: Secondary | ICD-10-CM | POA: Diagnosis present

## 2022-04-17 DIAGNOSIS — K7030 Alcoholic cirrhosis of liver without ascites: Secondary | ICD-10-CM | POA: Diagnosis present

## 2022-04-17 DIAGNOSIS — R627 Adult failure to thrive: Secondary | ICD-10-CM | POA: Diagnosis not present

## 2022-04-17 DIAGNOSIS — E1165 Type 2 diabetes mellitus with hyperglycemia: Secondary | ICD-10-CM | POA: Diagnosis not present

## 2022-04-17 DIAGNOSIS — Z833 Family history of diabetes mellitus: Secondary | ICD-10-CM

## 2022-04-17 DIAGNOSIS — M7989 Other specified soft tissue disorders: Secondary | ICD-10-CM | POA: Diagnosis not present

## 2022-04-17 DIAGNOSIS — Z7901 Long term (current) use of anticoagulants: Secondary | ICD-10-CM

## 2022-04-17 DIAGNOSIS — Z20822 Contact with and (suspected) exposure to covid-19: Secondary | ICD-10-CM | POA: Diagnosis present

## 2022-04-17 DIAGNOSIS — I5042 Chronic combined systolic (congestive) and diastolic (congestive) heart failure: Secondary | ICD-10-CM | POA: Diagnosis not present

## 2022-04-17 DIAGNOSIS — E038 Other specified hypothyroidism: Secondary | ICD-10-CM | POA: Diagnosis not present

## 2022-04-17 DIAGNOSIS — I429 Cardiomyopathy, unspecified: Secondary | ICD-10-CM | POA: Diagnosis not present

## 2022-04-17 DIAGNOSIS — Z87891 Personal history of nicotine dependence: Secondary | ICD-10-CM

## 2022-04-17 DIAGNOSIS — Z79890 Hormone replacement therapy (postmenopausal): Secondary | ICD-10-CM

## 2022-04-17 DIAGNOSIS — Z825 Family history of asthma and other chronic lower respiratory diseases: Secondary | ICD-10-CM

## 2022-04-17 DIAGNOSIS — Z832 Family history of diseases of the blood and blood-forming organs and certain disorders involving the immune mechanism: Secondary | ICD-10-CM

## 2022-04-17 DIAGNOSIS — K219 Gastro-esophageal reflux disease without esophagitis: Secondary | ICD-10-CM | POA: Diagnosis present

## 2022-04-17 DIAGNOSIS — R6889 Other general symptoms and signs: Secondary | ICD-10-CM | POA: Diagnosis not present

## 2022-04-17 DIAGNOSIS — Z7951 Long term (current) use of inhaled steroids: Secondary | ICD-10-CM

## 2022-04-17 DIAGNOSIS — E861 Hypovolemia: Secondary | ICD-10-CM | POA: Diagnosis present

## 2022-04-17 DIAGNOSIS — E538 Deficiency of other specified B group vitamins: Secondary | ICD-10-CM | POA: Diagnosis present

## 2022-04-17 DIAGNOSIS — M545 Low back pain, unspecified: Secondary | ICD-10-CM | POA: Diagnosis present

## 2022-04-17 DIAGNOSIS — E1122 Type 2 diabetes mellitus with diabetic chronic kidney disease: Secondary | ICD-10-CM | POA: Diagnosis not present

## 2022-04-17 DIAGNOSIS — R197 Diarrhea, unspecified: Secondary | ICD-10-CM | POA: Diagnosis not present

## 2022-04-17 DIAGNOSIS — Z6841 Body Mass Index (BMI) 40.0 and over, adult: Secondary | ICD-10-CM

## 2022-04-17 DIAGNOSIS — R9431 Abnormal electrocardiogram [ECG] [EKG]: Secondary | ICD-10-CM

## 2022-04-17 DIAGNOSIS — E119 Type 2 diabetes mellitus without complications: Secondary | ICD-10-CM | POA: Diagnosis present

## 2022-04-17 DIAGNOSIS — R5381 Other malaise: Secondary | ICD-10-CM | POA: Diagnosis present

## 2022-04-17 DIAGNOSIS — I700 Atherosclerosis of aorta: Secondary | ICD-10-CM | POA: Diagnosis not present

## 2022-04-17 DIAGNOSIS — A084 Viral intestinal infection, unspecified: Secondary | ICD-10-CM | POA: Diagnosis present

## 2022-04-17 DIAGNOSIS — I5033 Acute on chronic diastolic (congestive) heart failure: Secondary | ICD-10-CM | POA: Diagnosis present

## 2022-04-17 DIAGNOSIS — F419 Anxiety disorder, unspecified: Secondary | ICD-10-CM | POA: Diagnosis present

## 2022-04-17 DIAGNOSIS — F1010 Alcohol abuse, uncomplicated: Secondary | ICD-10-CM | POA: Diagnosis present

## 2022-04-17 DIAGNOSIS — E1142 Type 2 diabetes mellitus with diabetic polyneuropathy: Secondary | ICD-10-CM | POA: Diagnosis present

## 2022-04-17 LAB — I-STAT BETA HCG BLOOD, ED (MC, WL, AP ONLY): I-stat hCG, quantitative: <5 m[IU]/mL (ref <5)

## 2022-04-17 LAB — CBG MONITORING, ED: Glucose-Capillary: 174 mg/dL — ABNORMAL HIGH (ref 70–99)

## 2022-04-17 NOTE — ED Triage Notes (Signed)
Pt comes via [facility] for n/v/d and cough for the past 4 days. Denies fevers

## 2022-04-18 ENCOUNTER — Emergency Department (HOSPITAL_COMMUNITY): Payer: Medicare Other

## 2022-04-18 ENCOUNTER — Other Ambulatory Visit: Payer: Self-pay

## 2022-04-18 DIAGNOSIS — R0602 Shortness of breath: Secondary | ICD-10-CM | POA: Diagnosis not present

## 2022-04-18 DIAGNOSIS — I480 Paroxysmal atrial fibrillation: Secondary | ICD-10-CM | POA: Diagnosis present

## 2022-04-18 DIAGNOSIS — I700 Atherosclerosis of aorta: Secondary | ICD-10-CM | POA: Diagnosis not present

## 2022-04-18 DIAGNOSIS — E1151 Type 2 diabetes mellitus with diabetic peripheral angiopathy without gangrene: Secondary | ICD-10-CM | POA: Diagnosis not present

## 2022-04-18 DIAGNOSIS — R109 Unspecified abdominal pain: Secondary | ICD-10-CM | POA: Diagnosis not present

## 2022-04-18 DIAGNOSIS — E038 Other specified hypothyroidism: Secondary | ICD-10-CM | POA: Diagnosis not present

## 2022-04-18 DIAGNOSIS — Z20822 Contact with and (suspected) exposure to covid-19: Secondary | ICD-10-CM | POA: Diagnosis present

## 2022-04-18 DIAGNOSIS — I2510 Atherosclerotic heart disease of native coronary artery without angina pectoris: Secondary | ICD-10-CM | POA: Diagnosis not present

## 2022-04-18 DIAGNOSIS — F1010 Alcohol abuse, uncomplicated: Secondary | ICD-10-CM | POA: Diagnosis present

## 2022-04-18 DIAGNOSIS — Z6841 Body Mass Index (BMI) 40.0 and over, adult: Secondary | ICD-10-CM | POA: Diagnosis not present

## 2022-04-18 DIAGNOSIS — E1165 Type 2 diabetes mellitus with hyperglycemia: Secondary | ICD-10-CM | POA: Diagnosis not present

## 2022-04-18 DIAGNOSIS — K529 Noninfective gastroenteritis and colitis, unspecified: Secondary | ICD-10-CM | POA: Diagnosis not present

## 2022-04-18 DIAGNOSIS — M25511 Pain in right shoulder: Secondary | ICD-10-CM | POA: Diagnosis not present

## 2022-04-18 DIAGNOSIS — I5042 Chronic combined systolic (congestive) and diastolic (congestive) heart failure: Secondary | ICD-10-CM | POA: Diagnosis not present

## 2022-04-18 DIAGNOSIS — E039 Hypothyroidism, unspecified: Secondary | ICD-10-CM | POA: Diagnosis present

## 2022-04-18 DIAGNOSIS — E1122 Type 2 diabetes mellitus with diabetic chronic kidney disease: Secondary | ICD-10-CM | POA: Diagnosis present

## 2022-04-18 DIAGNOSIS — E860 Dehydration: Secondary | ICD-10-CM | POA: Diagnosis present

## 2022-04-18 DIAGNOSIS — I130 Hypertensive heart and chronic kidney disease with heart failure and stage 1 through stage 4 chronic kidney disease, or unspecified chronic kidney disease: Secondary | ICD-10-CM | POA: Diagnosis present

## 2022-04-18 DIAGNOSIS — A419 Sepsis, unspecified organism: Secondary | ICD-10-CM | POA: Diagnosis present

## 2022-04-18 DIAGNOSIS — A084 Viral intestinal infection, unspecified: Secondary | ICD-10-CM | POA: Diagnosis not present

## 2022-04-18 DIAGNOSIS — E785 Hyperlipidemia, unspecified: Secondary | ICD-10-CM | POA: Diagnosis present

## 2022-04-18 DIAGNOSIS — R059 Cough, unspecified: Secondary | ICD-10-CM | POA: Diagnosis not present

## 2022-04-18 DIAGNOSIS — I429 Cardiomyopathy, unspecified: Secondary | ICD-10-CM | POA: Diagnosis present

## 2022-04-18 DIAGNOSIS — A0839 Other viral enteritis: Secondary | ICD-10-CM | POA: Diagnosis present

## 2022-04-18 DIAGNOSIS — M7989 Other specified soft tissue disorders: Secondary | ICD-10-CM | POA: Diagnosis not present

## 2022-04-18 DIAGNOSIS — K7030 Alcoholic cirrhosis of liver without ascites: Secondary | ICD-10-CM | POA: Diagnosis present

## 2022-04-18 DIAGNOSIS — E876 Hypokalemia: Secondary | ICD-10-CM | POA: Diagnosis not present

## 2022-04-18 DIAGNOSIS — R627 Adult failure to thrive: Secondary | ICD-10-CM | POA: Diagnosis present

## 2022-04-18 DIAGNOSIS — N1832 Chronic kidney disease, stage 3b: Secondary | ICD-10-CM | POA: Diagnosis present

## 2022-04-18 DIAGNOSIS — E871 Hypo-osmolality and hyponatremia: Secondary | ICD-10-CM | POA: Diagnosis present

## 2022-04-18 DIAGNOSIS — J449 Chronic obstructive pulmonary disease, unspecified: Secondary | ICD-10-CM | POA: Diagnosis present

## 2022-04-18 DIAGNOSIS — F32A Depression, unspecified: Secondary | ICD-10-CM | POA: Diagnosis present

## 2022-04-18 LAB — COMPREHENSIVE METABOLIC PANEL
ALT: 53 U/L — ABNORMAL HIGH (ref 0–44)
ALT: 53 U/L — ABNORMAL HIGH (ref 0–44)
AST: 46 U/L — ABNORMAL HIGH (ref 15–41)
AST: 46 U/L — ABNORMAL HIGH (ref 15–41)
Albumin: 3 g/dL — ABNORMAL LOW (ref 3.5–5.0)
Albumin: 3.4 g/dL — ABNORMAL LOW (ref 3.5–5.0)
Alkaline Phosphatase: 107 U/L (ref 38–126)
Alkaline Phosphatase: 116 U/L (ref 38–126)
Anion gap: 11 (ref 5–15)
Anion gap: 12 (ref 5–15)
BUN: 20 mg/dL (ref 6–20)
BUN: 21 mg/dL — ABNORMAL HIGH (ref 6–20)
CO2: 24 mmol/L (ref 22–32)
CO2: 26 mmol/L (ref 22–32)
Calcium: 8.7 mg/dL — ABNORMAL LOW (ref 8.9–10.3)
Calcium: 9 mg/dL (ref 8.9–10.3)
Chloride: 100 mmol/L (ref 98–111)
Chloride: 98 mmol/L (ref 98–111)
Creatinine, Ser: 1.34 mg/dL — ABNORMAL HIGH (ref 0.44–1.00)
Creatinine, Ser: 1.43 mg/dL — ABNORMAL HIGH (ref 0.44–1.00)
GFR, Estimated: 42 mL/min — ABNORMAL LOW (ref >60)
GFR, Estimated: 46 mL/min — ABNORMAL LOW (ref >60)
Glucose, Bld: 153 mg/dL — ABNORMAL HIGH (ref 70–99)
Glucose, Bld: 226 mg/dL — ABNORMAL HIGH (ref 70–99)
Potassium: 2.7 mmol/L — CL (ref 3.5–5.1)
Potassium: 3.6 mmol/L (ref 3.5–5.1)
Sodium: 135 mmol/L (ref 135–145)
Sodium: 136 mmol/L (ref 135–145)
Total Bilirubin: 0.5 mg/dL (ref 0.3–1.2)
Total Bilirubin: 0.6 mg/dL (ref 0.3–1.2)
Total Protein: 6.2 g/dL — ABNORMAL LOW (ref 6.5–8.1)
Total Protein: 6.7 g/dL (ref 6.5–8.1)

## 2022-04-18 LAB — CBC WITH DIFFERENTIAL/PLATELET
Abs Immature Granulocytes: 0.24 10*3/uL — ABNORMAL HIGH (ref 0.00–0.07)
Basophils Absolute: 0 10*3/uL (ref 0.0–0.1)
Basophils Relative: 0 %
Eosinophils Absolute: 0 10*3/uL (ref 0.0–0.5)
Eosinophils Relative: 0 %
HCT: 38.6 % (ref 36.0–46.0)
Hemoglobin: 12.3 g/dL (ref 12.0–15.0)
Immature Granulocytes: 1 %
Lymphocytes Relative: 3 %
Lymphs Abs: 0.8 10*3/uL (ref 0.7–4.0)
MCH: 30.8 pg (ref 26.0–34.0)
MCHC: 31.9 g/dL (ref 30.0–36.0)
MCV: 96.7 fL (ref 80.0–100.0)
Monocytes Absolute: 1 10*3/uL (ref 0.1–1.0)
Monocytes Relative: 4 %
Neutro Abs: 24.1 10*3/uL — ABNORMAL HIGH (ref 1.7–7.7)
Neutrophils Relative %: 92 %
Platelets: 217 10*3/uL (ref 150–400)
RBC: 3.99 MIL/uL (ref 3.87–5.11)
RDW: 13.8 % (ref 11.5–15.5)
WBC: 26.2 10*3/uL — ABNORMAL HIGH (ref 4.0–10.5)
nRBC: 0 % (ref 0.0–0.2)

## 2022-04-18 LAB — LIPASE, BLOOD: Lipase: 28 U/L (ref 11–51)

## 2022-04-18 LAB — RESPIRATORY PANEL BY PCR

## 2022-04-18 LAB — SARS CORONAVIRUS 2 BY RT PCR: SARS Coronavirus 2 by RT PCR: NEGATIVE

## 2022-04-18 LAB — CBG MONITORING, ED
Glucose-Capillary: 204 mg/dL — ABNORMAL HIGH (ref 70–99)
Glucose-Capillary: 237 mg/dL — ABNORMAL HIGH (ref 70–99)
Glucose-Capillary: 186 mg/dL — ABNORMAL HIGH (ref 70–99)

## 2022-04-18 LAB — MAGNESIUM
Magnesium: 1.4 mg/dL — ABNORMAL LOW (ref 1.7–2.4)
Magnesium: 2 mg/dL (ref 1.7–2.4)

## 2022-04-18 LAB — TROPONIN I (HIGH SENSITIVITY)
Troponin I (High Sensitivity): 25 ng/L — ABNORMAL HIGH (ref <18)
Troponin I (High Sensitivity): 26 ng/L — ABNORMAL HIGH (ref <18)

## 2022-04-18 LAB — GLUCOSE, CAPILLARY: Glucose-Capillary: 219 mg/dL — ABNORMAL HIGH (ref 70–99)

## 2022-04-18 LAB — BRAIN NATRIURETIC PEPTIDE: B Natriuretic Peptide: 235.6 pg/mL — ABNORMAL HIGH (ref 0.0–100.0)

## 2022-04-18 LAB — CBC
HCT: 40.1 % (ref 36.0–46.0)
Hemoglobin: 13 g/dL (ref 12.0–15.0)
MCH: 30.7 pg (ref 26.0–34.0)
MCHC: 32.4 g/dL (ref 30.0–36.0)
MCV: 94.8 fL (ref 80.0–100.0)
Platelets: 245 10*3/uL (ref 150–400)
RBC: 4.23 MIL/uL (ref 3.87–5.11)
RDW: 13.6 % (ref 11.5–15.5)
WBC: 30 10*3/uL — ABNORMAL HIGH (ref 4.0–10.5)
nRBC: 0 % (ref 0.0–0.2)

## 2022-04-18 LAB — HEMOGLOBIN A1C
Hgb A1c MFr Bld: 10.3 % — ABNORMAL HIGH (ref 4.8–5.6)
Mean Plasma Glucose: 248.91 mg/dL

## 2022-04-18 LAB — PHOSPHORUS: Phosphorus: 2.7 mg/dL (ref 2.5–4.6)

## 2022-04-18 LAB — C DIFFICILE QUICK SCREEN W PCR REFLEX
C Diff antigen: NEGATIVE
C Diff interpretation: NOT DETECTED
C Diff toxin: NEGATIVE

## 2022-04-18 MED ORDER — IBUPROFEN 600 MG PO TABS: 600.0000 mg | ORAL_TABLET | ORAL | Status: DC | PRN

## 2022-04-18 MED ORDER — SODIUM CHLORIDE 0.9 % IV SOLN: INTRAVENOUS | Status: DC | PRN

## 2022-04-18 MED ORDER — POTASSIUM CHLORIDE 10 MEQ/100ML IV SOLN
10.0000 meq | Freq: Once | INTRAVENOUS | Status: AC
  Administered 2022-04-18: 10 meq via INTRAVENOUS
  Filled 2022-04-18: qty 100

## 2022-04-18 MED ORDER — PROCHLORPERAZINE EDISYLATE 10 MG/2ML IJ SOLN
10.0000 mg | Freq: Four times a day (QID) | INTRAMUSCULAR | Status: AC | PRN
  Administered 2022-04-18 (×2): 10 mg via INTRAVENOUS
  Filled 2022-04-18 (×2): qty 2

## 2022-04-18 MED ORDER — MAGNESIUM OXIDE -MG SUPPLEMENT 400 (240 MG) MG PO TABS
800.0000 mg | ORAL_TABLET | Freq: Once | ORAL | Status: AC
  Administered 2022-04-18: 800 mg via ORAL
  Filled 2022-04-18: qty 2

## 2022-04-18 MED ORDER — POTASSIUM CHLORIDE 10 MEQ/100ML IV SOLN
10.0000 meq | INTRAVENOUS | Status: AC
  Administered 2022-04-18 (×4): 10 meq via INTRAVENOUS
  Filled 2022-04-18 (×4): qty 100

## 2022-04-18 MED ORDER — APIXABAN 5 MG PO TABS
5.0000 mg | ORAL_TABLET | Freq: Two times a day (BID) | ORAL | Status: DC
  Administered 2022-04-18 – 2022-04-22 (×9): 5 mg via ORAL
  Filled 2022-04-18 (×9): qty 1

## 2022-04-18 MED ORDER — AMIODARONE HCL 200 MG PO TABS
100.0000 mg | ORAL_TABLET | Freq: Every morning | ORAL | Status: DC
  Administered 2022-04-19 – 2022-04-22 (×4): 100 mg via ORAL
  Filled 2022-04-18 (×5): qty 1

## 2022-04-18 MED ORDER — LEVOTHYROXINE SODIUM 50 MCG PO TABS
50.0000 ug | ORAL_TABLET | Freq: Every day | ORAL | Status: DC
Start: 2022-04-18 — End: 2022-04-22
  Administered 2022-04-18 – 2022-04-22 (×5): 50 ug via ORAL
  Filled 2022-04-18 (×3): qty 1
  Filled 2022-04-18: qty 2
  Filled 2022-04-18: qty 1

## 2022-04-18 MED ORDER — IOHEXOL 300 MG/ML  SOLN
80.0000 mL | Freq: Once | INTRAMUSCULAR | Status: AC | PRN
Start: 2022-04-18 — End: 2022-04-18
  Administered 2022-04-18: 80 mL via INTRAVENOUS

## 2022-04-18 MED ORDER — MAGNESIUM SULFATE 2 GM/50ML IV SOLN
2.0000 g | Freq: Once | INTRAVENOUS | Status: AC
  Administered 2022-04-18: 2 g via INTRAVENOUS
  Filled 2022-04-18: qty 50

## 2022-04-18 MED ORDER — MAGNESIUM SULFATE 2 GM/50ML IV SOLN
2.0000 g | Freq: Once | INTRAVENOUS | Status: AC
  Administered 2022-04-18: 2 g via INTRAVENOUS

## 2022-04-18 MED ORDER — LORAZEPAM 2 MG/ML IJ SOLN
0.5000 mg | Freq: Once | INTRAMUSCULAR | Status: AC
Start: 2022-04-18 — End: 2022-04-18
  Administered 2022-04-18: 0.5 mg via INTRAVENOUS
  Filled 2022-04-18: qty 1

## 2022-04-18 MED ORDER — ACETAMINOPHEN 325 MG PO TABS
650.0000 mg | ORAL_TABLET | Freq: Four times a day (QID) | ORAL | Status: DC | PRN
  Administered 2022-04-18 – 2022-04-22 (×10): 650 mg via ORAL
  Filled 2022-04-18 (×10): qty 2

## 2022-04-18 MED ORDER — INSULIN ASPART 100 UNIT/ML IJ SOLN
0.0000 [IU] | Freq: Three times a day (TID) | INTRAMUSCULAR | Status: DC
  Administered 2022-04-18: 5 [IU] via SUBCUTANEOUS
  Administered 2022-04-18: 3 [IU] via SUBCUTANEOUS
  Administered 2022-04-18: 5 [IU] via SUBCUTANEOUS
  Administered 2022-04-19: 15 [IU] via SUBCUTANEOUS
  Administered 2022-04-19: 5 [IU] via SUBCUTANEOUS
  Administered 2022-04-19: 2 [IU] via SUBCUTANEOUS
  Administered 2022-04-20: 11 [IU] via SUBCUTANEOUS
  Administered 2022-04-20: 15 [IU] via SUBCUTANEOUS
  Administered 2022-04-20: 5 [IU] via SUBCUTANEOUS
  Administered 2022-04-21: 15 [IU] via SUBCUTANEOUS
  Administered 2022-04-21: 8 [IU] via SUBCUTANEOUS
  Administered 2022-04-21: 5 [IU] via SUBCUTANEOUS
  Administered 2022-04-22: 3 [IU] via SUBCUTANEOUS
  Administered 2022-04-22: 8 [IU] via SUBCUTANEOUS

## 2022-04-18 MED ORDER — BUPROPION HCL ER (SR) 150 MG PO TB12
150.0000 mg | ORAL_TABLET | Freq: Two times a day (BID) | ORAL | Status: DC
  Administered 2022-04-18 – 2022-04-22 (×8): 150 mg via ORAL
  Filled 2022-04-18 (×9): qty 1

## 2022-04-18 MED ORDER — FLUTICASONE FUROATE-VILANTEROL 200-25 MCG/ACT IN AEPB
1.0000 | INHALATION_SPRAY | Freq: Every day | RESPIRATORY_TRACT | Status: DC
  Administered 2022-04-19 – 2022-04-22 (×4): 1 via RESPIRATORY_TRACT
  Filled 2022-04-18 (×2): qty 28

## 2022-04-18 MED ORDER — FLUOXETINE HCL 20 MG PO CAPS
20.0000 mg | ORAL_CAPSULE | Freq: Every day | ORAL | Status: DC
Start: 2022-04-18 — End: 2022-04-22
  Administered 2022-04-18 – 2022-04-21 (×4): 20 mg via ORAL
  Filled 2022-04-18 (×4): qty 1

## 2022-04-18 MED ORDER — ATORVASTATIN CALCIUM 40 MG PO TABS
40.0000 mg | ORAL_TABLET | Freq: Every day | ORAL | Status: DC
  Administered 2022-04-18 – 2022-04-22 (×5): 40 mg via ORAL
  Filled 2022-04-18 (×5): qty 1

## 2022-04-18 MED ORDER — INSULIN ASPART 100 UNIT/ML IJ SOLN
0.0000 [IU] | Freq: Every day | INTRAMUSCULAR | Status: DC
  Administered 2022-04-18: 2 [IU] via SUBCUTANEOUS
  Administered 2022-04-19 – 2022-04-20 (×2): 3 [IU] via SUBCUTANEOUS
  Administered 2022-04-21: 2 [IU] via SUBCUTANEOUS

## 2022-04-18 MED ORDER — POTASSIUM CHLORIDE CRYS ER 20 MEQ PO TBCR
40.0000 meq | EXTENDED_RELEASE_TABLET | Freq: Two times a day (BID) | ORAL | Status: AC
Start: 2022-04-18 — End: 2022-04-18
  Administered 2022-04-18 (×2): 40 meq via ORAL
  Filled 2022-04-18 (×2): qty 2

## 2022-04-18 NOTE — ED Notes (Signed)
Patient transported to CT 

## 2022-04-18 NOTE — ED Provider Notes (Signed)
[facility] EMERGENCY DEPARTMENT Provider Note   CSN: [id number] Arrival date & time: 04/17/22  2313     History  Chief Complaint  Patient presents with   Vomiting    [name] is a 61 y.o. female.  The history is provided by the patient and medical records.   61 y.o. F with hx of PAF on eliquis, CHF, CAD, CKD, hypertension, hyperlipidemia, obesity, presenting to the ED with 4 days of cough, nausea, vomiting, diarrhea.  She denies any fevers.  States pain across lower abdomen and it also feels "swollen".  Also has some increased LE edema.  She uses PRN O2 at home, has been using recently.  She denies any sick contacts with covid/flu.    Home Medications Prior to Admission medications   Medication Sig Start Date End Date Taking? Authorizing Provider  ACCU-CHEK GUIDE test strip USE TO CHECK BLOOD SUGAR FOUR TIMES DAILY 02/25/20   [provider]  acetaminophen (TYLENOL) 500 MG tablet Take 500 mg by mouth every 6 (six) hours as needed for moderate pain.    [provider]  albuterol (VENTOLIN HFA) 108 (90 Base) MCG/ACT inhaler Inhale 2 puffs into the lungs every 6 (six) hours as needed for wheezing or shortness of breath. 07/30/21   Danford, Suann Larry, MD  allopurinol (ZYLOPRIM) 100 MG tablet TAKE 1 TABLET (100 MG TOTAL) BY MOUTH DAILY(AM) 04/07/22   Charlott Rakes, MD  amiodarone (PACERONE) 200 MG tablet TAKE 1/2 TABLET (100 MG TOTAL) BY MOUTH DAILY (AM) 04/07/22   Larey Dresser, MD  amLODipine (NORVASC) 10 MG tablet Take 1 tablet (10 mg total) by mouth daily. 10/21/21   Larey Dresser, MD  apixaban (ELIQUIS) 5 MG TABS tablet Take 1 tablet (5 mg total) by mouth 2 (two) times daily. 12/10/21   Larey Dresser, MD  atorvastatin (LIPITOR) 40 MG tablet TAKE 1 TABLET (40 MG TOTAL) BY MOUTH EVERY EVENING (BEDTIME) 03/14/22   Charlott Rakes, MD  budesonide-formoterol (SYMBICORT) 160-4.5 MCG/ACT inhaler Inhale 2 puffs into the lungs 2 (two) times  daily. 12/09/19   Eugenie Filler, MD  buPROPion (WELLBUTRIN SR) 150 MG 12 hr tablet Take 150 mg by mouth 2 (two) times daily.    [provider]  colchicine 0.6 MG tablet TAKE 2 TABS (1.2 MG) AT THE ONSET OF A GOUT FLARE, MAY TAKE 1 TAB (0.6 MG) IN 1 HOUR IF SYMPTOMS Patient taking differently: daily. Take 2 tabs (1.2 mg) at the onset of a gout flare, may take 1 tab (0.6 mg) in 1 hour if symptoms 12/10/21   Charlott Rakes, MD  Continuous Blood Gluc Sensor (FREESTYLE LIBRE 2 SENSOR) MISC USE 1 (ONE) EACH EVERY 2 WEEKS 07/14/21   Charlott Rakes, MD  Dulaglutide (TRULICITY) 1.5 GL/8.7FI SOPN Inject 1.5 mg into the skin once a week. 08/11/21   Charlott Rakes, MD  ferrous sulfate (FEROSUL) 325 (65 FE) MG tablet TAKE 1 TABLET (324 MG TOTAL) BY MOUTH 2 (TWO) TIMES DAILY AS NEEDED. (AM+BEDTIME) 04/07/22   Charlott Rakes, MD  FLUoxetine (PROZAC) 20 MG capsule TAKE 1 CAPSULE (20 MG TOTAL) BY MOUTH AT BEDTIME. 03/14/22   Charlott Rakes, MD  folic acid (FOLVITE) 1 MG tablet TAKE 1 TABLET (1 MG TOTAL) BY MOUTH DAILY. 12/23/21   Charlott Rakes, MD  hydrALAZINE (APRESOLINE) 50 MG tablet Take 1.5 tablets (75 mg total) by mouth 3 (three) times daily. 01/31/22   Rafael Bihari, FNP  insulin glargine (LANTUS) 100 UNIT/ML Solostar  Pen Inject 30 Units into the skin daily. 07/14/21 07/14/22  Charlott Rakes, MD  insulin lispro (HUMALOG) 100 UNIT/ML KwikPen Inject 8 Units into the skin 3 (three) times daily with meals. 01/05/21   Allie Bossier, MD  Insulin Pen Needle (EASY COMFORT PEN NEEDLES) 31G X 5 MM MISC USE FOUR TIMES A DAY FOR INSULIN ADMINISTRATION 04/08/22   Charlott Rakes, MD  JARDIANCE 10 MG TABS tablet TAKE 1 TABLET (10 MG TOTAL) BY MOUTH DAILY BEFORE BREAKFAST (AM) 04/07/22   Bensimhon, Shaune Pascal, MD  levothyroxine (SYNTHROID) 50 MCG tablet TAKE 1 TABLET (50 MCG TOTAL) BY MOUTH DAILY (AM) 03/14/22   Charlott Rakes, MD  losartan (COZAAR) 25 MG tablet TAKE 2 TABLETS (50 MG TOTAL) BY MOUTH DAILY (AM)  04/07/22   Charlott Rakes, MD  Omega-3 Fatty Acids (FISH OIL PO) Take 1 capsule by mouth daily.    [provider]  ondansetron (ZOFRAN) 4 MG tablet Take 1 tablet (4 mg total) by mouth every 8 (eight) hours as needed for nausea or vomiting. Patient not taking: Reported on 03/16/2022 08/11/21   Charlott Rakes, MD  pantoprazole (PROTONIX) 40 MG tablet TAKE 1 TABLET (40 MG TOTAL) BY MOUTH DAILY(AM) 04/07/22   Charlott Rakes, MD  spironolactone (ALDACTONE) 25 MG tablet Take 0.5 tablets (12.5 mg total) by mouth daily. 04/04/22   Larey Dresser, MD  torsemide (DEMADEX) 20 MG tablet Take 80 mg in am and 60 mg in pm 04/06/22   Larey Dresser, MD  VITAMIN D PO Take 1 tablet by mouth daily.    [provider]  tiotropium (SPIRIVA HANDIHALER) 18 MCG inhalation capsule Place 1 capsule (18 mcg total) into inhaler and inhale every morning. Patient not taking: Reported on 02/09/2021 12/09/19 02/09/21  Eugenie Filler, MD      Allergies    Gabapentin and Lyrica [pregabalin]    Review of Systems   Review of Systems  Respiratory:  Positive for cough.   Gastrointestinal:  Positive for diarrhea, nausea and vomiting.  All other systems reviewed and are negative.  Physical Exam Updated Vital Signs BP (!) 122/57   Pulse 96   Temp 98.5 F (36.9 C) (Oral)   Resp 16   LMP  (LMP Unknown)   SpO2 94%   Physical Exam Vitals and nursing note reviewed.  Constitutional:      Appearance: She is well-developed.  HENT:     Head: Normocephalic and atraumatic.  Eyes:     Conjunctiva/sclera: Conjunctivae normal.     Pupils: Pupils are equal, round, and reactive to light.  Cardiovascular:     Rate and Rhythm: Normal rate and regular rhythm.     Heart sounds: Normal heart sounds.  Pulmonary:     Effort: Pulmonary effort is normal. No respiratory distress.     Breath sounds: Rales present. No rhonchi.     Comments: Voice is raspy, rales at bases Abdominal:     General: Bowel sounds are  normal.     Palpations: Abdomen is soft.     Tenderness: There is abdominal tenderness. There is no rebound.     Comments: Tender across lower abdomen, no guarding, no peritoneal signs  Musculoskeletal:        General: Normal range of motion.     Cervical back: Normal range of motion.     Comments: 1+ edema BLE, grossly symmetric  Skin:    General: Skin is warm and dry.  Neurological:     Mental Status: She  is alert and oriented to person, place, and time.    ED Results / Procedures / Treatments   Labs (all labs ordered are listed, but only abnormal results are displayed) Labs Reviewed  COMPREHENSIVE METABOLIC PANEL - Abnormal; Notable for the following components:      Result Value   Potassium 2.7 (*)    Glucose, Bld 153 (*)    BUN 21 (*)    Creatinine, Ser 1.43 (*)    Albumin 3.4 (*)    AST 46 (*)    ALT 53 (*)    GFR, Estimated 42 (*)    All other components within normal limits  CBC - Abnormal; Notable for the following components:   WBC 30.0 (*)    All other components within normal limits  BRAIN NATRIURETIC PEPTIDE - Abnormal; Notable for the following components:   B Natriuretic Peptide 235.6 (*)    All other components within normal limits  MAGNESIUM - Abnormal; Notable for the following components:   Magnesium 1.4 (*)    All other components within normal limits  CBG MONITORING, ED - Abnormal; Notable for the following components:   Glucose-Capillary 174 (*)    All other components within normal limits  TROPONIN I (HIGH SENSITIVITY) - Abnormal; Notable for the following components:   Troponin I (High Sensitivity) 25 (*)    All other components within normal limits  RESPIRATORY PANEL BY PCR  SARS CORONAVIRUS 2 BY RT PCR  LIPASE, BLOOD  URINALYSIS, ROUTINE W REFLEX MICROSCOPIC  I-STAT BETA HCG BLOOD, ED ([facility], [facility], [facility] ONLY)  TROPONIN I (HIGH SENSITIVITY)    EKG None  Radiology CT ABDOMEN PELVIS W CONTRAST  Result Date: 04/18/2022 CLINICAL DATA:  Left lower  quadrant pain. EXAM: CT ABDOMEN AND PELVIS WITH CONTRAST TECHNIQUE: Multidetector CT imaging of the abdomen and pelvis was performed using the standard protocol following bolus administration of intravenous contrast. RADIATION DOSE REDUCTION: This exam was performed according to the departmental dose-optimization program which includes automated exposure control, adjustment of the mA and/or kV according to patient size and/or use of iterative reconstruction technique. CONTRAST:  2mL OMNIPAQUE IOHEXOL 300 MG/ML  SOLN COMPARISON:  None Available. FINDINGS: Lower chest: No acute abnormality. Hepatobiliary: No focal liver abnormality is seen. No gallstones, gallbladder wall thickening, or biliary dilatation. Pancreas: Unremarkable. No pancreatic ductal dilatation or surrounding inflammatory changes. Spleen: Normal in size without focal abnormality. Adrenals/Urinary Tract: Adrenal glands are unremarkable. Kidneys are normal in size, without renal calculi or hydronephrosis. A 2.0 cm fat density lesion is seen within the anterior aspect of the mid to lower right kidney (approximately -70.25 Hounsfield units). Bladder is unremarkable. Stomach/Bowel: Stomach is within normal limits. Appendix appears normal. No evidence of bowel wall thickening, distention, or inflammatory changes. Vascular/Lymphatic: Aortic atherosclerosis. No enlarged abdominal or pelvic lymph nodes. Reproductive: Uterus and bilateral adnexa are unremarkable. Other: No abdominal wall hernia or abnormality. No abdominopelvic ascites. Musculoskeletal: No acute or significant osseous findings. IMPRESSION: 1. No acute or active process within the abdomen or pelvis. 2. 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 3. Aortic atherosclerosis. Aortic Atherosclerosis (ICD10-I70.0). Electronically Signed   By: Virgina Norfolk M.D.   On: 04/18/2022 02:52   DG Chest Port 1 View  Result Date: 04/18/2022 CLINICAL DATA:  Cough and shortness of breath. EXAM: PORTABLE CHEST 1  VIEW COMPARISON:  None Available. FINDINGS: The heart size and mediastinal contours are within normal limits. A CardioMEMS device is seen projecting over the left hilum. Both lungs are clear. The visualized  skeletal structures are unremarkable. IMPRESSION: No active disease. Electronically Signed   By: Virgina Norfolk M.D.   On: 04/18/2022 02:14    Procedures Procedures    CRITICAL CARE Performed by: Larene Pickett   Total critical care time: 45 minutes  Critical care time was exclusive of separately billable procedures and treating other patients.  Critical care was necessary to treat or prevent imminent or life-threatening deterioration.  Critical care was time spent personally by me on the following activities: development of treatment plan with patient and/or surrogate as well as nursing, discussions with consultants, evaluation of patient's response to treatment, examination of patient, obtaining history from patient or surrogate, ordering and performing treatments and interventions, ordering and review of laboratory studies, ordering and review of radiographic studies, pulse oximetry and re-evaluation of patient's condition.   Medications Ordered in ED Medications  potassium chloride 10 mEq in 100 mL IVPB ( Intravenous Infusion Verify 04/18/22 0255)  magnesium sulfate IVPB 2 g 50 mL (2 g Intravenous New Bag/Given 04/18/22 0252)  0.9 %  sodium chloride infusion ( Intravenous New Bag/Given 04/18/22 0256)  LORazepam (ATIVAN) injection 0.5 mg (0.5 mg Intravenous Given 04/18/22 0210)  iohexol (OMNIPAQUE) 300 MG/ML solution 80 mL (80 mLs Intravenous Contrast Given 04/18/22 0238)    ED Course/ Medical Decision Making/ A&P                           Medical Decision Making Amount and/or Complexity of Data Reviewed External Data Reviewed: labs, ECG and notes. Labs: ordered. Radiology: ordered and independent interpretation performed. ECG/medicine tests: ordered and independent interpretation  performed.  Risk Prescription drug management. Decision regarding hospitalization.   61 year old female presenting to the ED with nausea, vomiting, and diarrhea for the past 4 days.  She denies any fevers or sick contacts.  She is dry heaving on arrival.  EKG printed at bedside, she does have severely prolonged QT at 592 (prior in 430's range).  Labs obtained from triage with significant leukocytosis of 30,000.  No fever, tachycardia, or hypotension so not really meeting SIRS criteria.  Renal function appears around her baseline, K+ low at 2.7.  given IV replacement..  She does sound somewhat wet on exam.  Does have history of CHF, 1+ edema of the ankles.  Will add on BNP, trop, CXR, magnesium, CT AP.  Given small dose of ativan given her prolonged QT.  Magnesium also low at 1.4.  Troponin is minimally elevated at 25.  BNP also elevated 235.  Chest x-ray without acute findings.  Awaiting CT scan.  K+ infusing, will also start Mg+.  3:11 AM Patient is resting comfortbaly on her PRN O2, she does desaturate some when sleeping-- hx of OSA.  She is no longer vomiting after small dose of ativan.  We have reviewed her CT that is without acute findings.  Possibly viral etiology? Given her severly prolonged QT and ongoing electrolyte derangements, she is high risk for arrythmia such as torsades.  She will require admission.  Discussed with hospitalist Dr. Nevada Crane-- request c. Diff and stool studies to be added which was done, she will admit for further management.  Final Clinical Impression(s) / ED Diagnoses Final diagnoses:  Nausea vomiting and diarrhea  Hypokalemia  Hypomagnesemia  Prolonged Q-T interval on ECG    Rx / DC Orders ED Discharge Orders     None         Larene Pickett, PA-C 04/18/22 [phone]  Quintella Reichert, MD 04/18/22 [phone]

## 2022-04-18 NOTE — ED Notes (Signed)
Pt c/o of nausea and pain, Provider notified

## 2022-04-18 NOTE — ED Notes (Signed)
PT was desatting while sleeping so I placed on 2L for support

## 2022-04-18 NOTE — Progress Notes (Signed)
  Day zero note    [name]  LKG:[id number] DOB: November 28, 1960 DOA: 04/17/2022 PCP: Karla Rakes, MD   Brief Narrative:  [name] is a 61 y.o. female with medical history significant for HFpEF 26 to 60% with grade 2 diastolic dysfunction, hyperlipidemia, peripheral artery disease, who presented to [facility] ED from home with complaints of nausea, vomiting, and diarrhea for 4 days.  Admitted for intractable nausea vomiting diarrhea poor p.o. intake dehydration and failure to thrive.  Likely viral gastroenteritis given history.  Continue supportive care, IV fluids, electrolyte repletion as necessary.  Flexi-Seal and acetaminophen/ibuprofen ordered today for generalized diffuse pain and questionable low-grade fevers.  Otherwise continue supportive care, micro pending -cannot rule out bacterial diarrhea at this point, avoid motility agents.  Assessment & Plan:   Principal Problem:   Viral gastroenteritis Active Problems:   DM (diabetes mellitus), type 2 with peripheral vascular complications ([name])   Long term current use of anticoagulant therapy   Hypothyroidism   Hypokalemia   Chronic combined systolic and diastolic CHF (congestive heart failure) ([name])   Severe obesity (BMI >= 40) ([name])   Depression   CAD (coronary artery disease)   Low back pain   Morbid obesity ([name])   Stage 3b chronic kidney disease ([place])   Uncontrolled type 2 diabetes mellitus with hyperglycemia ([place])   Suspected viral gastroenteritis, POA Rule out bacterial infection  HFpEF 55 to 60% with grade 2 diastolic dysfunction   Electrolytes disturbances, likely from GI losses  Intractable nausea and vomiting   Paroxysmal A-fib   CKD 3B   Prolonged QTc   Type 2 diabetes uncontrolled, A1c greater than 10 with hyperglycemia   Elevated liver chemistries   Chronic anxiety/depression  Gout   GERD    Karla Ishikawa, DO [facility]  If 7PM-7AM, please contact  night-coverage [email or web address]  04/18/2022, 8:02 AM

## 2022-04-18 NOTE — ED Notes (Signed)
Attempted calling family members to given an update. Neither pt's sisters answered.

## 2022-04-18 NOTE — H&P (Addendum)
History and Physical  [name] DJM:[id number] DOB: February 10, 1961 DOA: 04/17/2022  Referring physician: Quincy Carnes- PA, EDP PCP: Charlott Rakes, MD  Outpatient Specialists: Cardiology. Patient coming from: Home.  Chief Complaint: Nausea, vomiting, diarrhea  HPI: LEVON PENNING is a 61 y.o. female with medical history significant for HFpEF 55 to 60% with grade 2 diastolic dysfunction, hyperlipidemia, peripheral artery disease, who presented to [name] ED from home with complaints of nausea, vomiting, and diarrhea for 4 days.  No abdominal pain.  Denies subjective fevers or chills.   In the ED, tachycardic with a QTc of 592.  She received IV Ativan for her nausea in the setting of prolonged QTc.  Lab studies notable for electrolytes derangement with serum potassium 2.7, magnesium 1.4.  The patient received electrolytes replacement.  Per the patient, she had recent use of oral antibiotics, about a month ago.  No recurrent diarrhea thus far.  C. difficile PCR and GI panel by PCR are pending.  At the time of this visit, she is somnolent after receiving IV Ativan, arouses to voices and drifts back to sleep.  TRH, hospitalist service was asked to admit.  ED Course: Tmax 98.5.  BP 131/58, pulse 86, respiratory 22, saturation 93% on 2 L.  Lab studies remarkable for serum potassium 2.7, serum glucose 153, BUN 21, creatinine 1.43, magnesium 1.4.  Review of Systems: Review of systems as noted in the HPI. All other systems reviewed and are negative.   Past Medical History:  Diagnosis Date   Alcohol abuse    Alcoholic cirrhosis ([place])    Anemia    Anxiety    Arthritis    "knees" (11/26/2018)   B12 deficiency    CAD (coronary artery disease)    a. 11/10/2014 Cath: LM nl, LAD min irregs, D1 30 ost, D2 50d, LCX 71m, OM1 80 p/m (1.5 mm vessel), OM2 45m, RCA nondom 49m-->med rx.. Demand ischemia in the setting of rapid a-fib.   Cardiomyopathy ([place])    Carotid artery disease ([place])    a. 01/2015 Carotid  Angio: RICA 222, LICA 97L; b. 06/9210 s/p L CEA; c. 05/2019 Carotid U/S: RICA 100. RECA >50. LICA 9-41%.   Cellulitis    lower extremities   CHF (congestive heart failure) ([name])    Chronic combined systolic and diastolic CHF (congestive heart failure) ([place])    a. 10/2014 Echo: EF 40-45%; b. 10/2018 Echo: EF 45-50%, gr2 DD; c. 11/2019 Echo: EF 50%, mild LVH, gr2 DD (restrictive), antlat HK, Nl RV fxn. Mild BAE. RVSP 59.3mmHg.   CKD (chronic kidney disease), stage III ([name])    Cocaine abuse ([name])    COPD (chronic obstructive pulmonary disease) ([name])    Critical lower limb ischemia ([name])    Depression    Diabetes mellitus without complication ([name])    Diabetic peripheral neuropathy ([name])    DVT (deep venous thrombosis) ([name])    Dyspnea    Elevated troponin    a. Chronic elevation.   GERD (gastroesophageal reflux disease)    Hyperlipemia    Hypertension    Hypokalemia    Hypomagnesemia    Hypothyroidism    Marijuana abuse    Narcotic abuse ([place])    Noncompliance    NSVT (nonsustained ventricular tachycardia) ([name])    Obesity    PAF (paroxysmal atrial fibrillation) ([name])    Paroxysmal atrial tachycardia ([place])    Peripheral arterial disease ([place])    a. 01/2015 Angio/PTA: RSFA 99 (atherectomy/pta) - 1 vessel runoff via diff dzs  peroneal; b. 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   Pneumonia    "once or twice" (11/26/2018)   Poorly controlled type 2 diabetes mellitus ([place])    Renal disorder    Renal insufficiency    a. 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   Sleep apnea    "couldn't handle wearing the mask" (11/26/2018)   Symptomatic bradycardia    a. Avoid AV blocking agent per EP. Prev req temp wire in 2017.   Tobacco abuse    Past Surgical History:  Procedure Laterality Date   ABDOMINAL AORTOGRAM N/A 06/26/2019   Procedure: ABDOMINAL AORTOGRAM;  Surgeon: Wellington Hampshire, MD;  Location: [place] [facility];  Service: Cardiovascular;  Laterality: N/A;   ABDOMINAL AORTOGRAM  W/LOWER EXTREMITY N/A 06/07/2021   Procedure: ABDOMINAL AORTOGRAM W/LOWER EXTREMITY;  Surgeon: [name], MD;  Location: [place] [facility];  Service: Cardiovascular;  Laterality: N/A;   AMPUTATION Right 06/14/2017   Procedure: Right foot transmetatarsal amputation;  Surgeon: Newt Minion, MD;  Location: [place];  Service: Orthopedics;  Laterality: Right;   AMPUTATION Left 06/28/2019   Procedure: AMPUTATION LEFT FIFTH TOE;  Surgeon: Rosetta Posner, MD;  Location: [place];  Service: Vascular;  Laterality: Left;   AMPUTATION TOE Right 04/28/2017   Procedure: AMPUTATION OF RIGHT SECOND RAY;  Surgeon: Newt Minion, MD;  Location: [place];  Service: Orthopedics;  Laterality: Right;   CARDIAC CATHETERIZATION     CARDIAC CATHETERIZATION N/A 01/12/2016   Procedure: Temporary Wire;  Surgeon: Minus Breeding, MD;  Location: [place] [facility];  Service: Cardiovascular;  Laterality: N/A;   CARDIOVERSION  ~ 02/2013   "twice"    CAROTID ANGIOGRAM N/A 01/15/2015   Procedure: CAROTID ANGIOGRAM;  Surgeon: [name], MD;  Location: [facility] C[facility] LAB;  Service: Cardiovascular;  Laterality: N/A;   DILATION AND CURETTAGE OF UTERUS  1988   ENDARTERECTOMY Left 02/19/2015   Procedure: LEFT CAROTID ENDARTERECTOMY ;  Surgeon: Serafina Mitchell, MD;  Location: [place];  Service: Vascular;  Laterality: Left;   FEMORAL-TIBIAL BYPASS GRAFT Left 06/28/2019   Procedure: BYPASS GRAFT LEFT LEG FEMORAL TO ANTERIOR TIBIAL ARTERY using LEFT GREATER SAPHENOUS VEIN;  Surgeon: Rosetta Posner, MD;  Location: [place];  Service: Vascular;  Laterality: Left;   LEFT HEART CATHETERIZATION WITH CORONARY ANGIOGRAM N/A 10/31/2014   Procedure: LEFT HEART CATHETERIZATION WITH CORONARY ANGIOGRAM;  Surgeon: Burnell Blanks, MD;  Location: [facility] C[facility] LAB;  Service: Cardiovascular;  Laterality: N/A;   LOWER EXTREMITY ANGIOGRAM N/A 09/10/2013   Procedure: LOWER EXTREMITY ANGIOGRAM;  Surgeon: [name], MD;  Location: [facility] C[facility] LAB;  Service:  Cardiovascular;  Laterality: N/A;   LOWER EXTREMITY ANGIOGRAM N/A 01/15/2015   Procedure: LOWER EXTREMITY ANGIOGRAM;  Surgeon: [name], MD;  Location: [facility] CATH LAB;  Service: Cardiovascular;  Laterality: N/A;   LOWER EXTREMITY ANGIOGRAPHY N/A 04/13/2017   Procedure: Lower Extremity Angiography;  Surgeon: [name], MD;  Location: [place] [facility];  Service: Cardiovascular;  Laterality: N/A;   LOWER EXTREMITY ANGIOGRAPHY Left 06/26/2019   Procedure: LOWER EXTREMITY ANGIOGRAPHY;  Surgeon: Wellington Hampshire, MD;  Location: [place] [facility];  Service: Cardiovascular;  Laterality: Left;   PERIPHERAL VASCULAR ATHERECTOMY Right 06/07/2021   Procedure: PERIPHERAL VASCULAR ATHERECTOMY;  Surgeon: [name], MD;  Location: [place] [facility];  Service: Cardiovascular;  Laterality: Right;   PERIPHERAL VASCULAR BALLOON ANGIOPLASTY Left 06/26/2019   Procedure: PERIPHERAL VASCULAR  BALLOON ANGIOPLASTY;  Surgeon: Wellington Hampshire, MD;  Location: [place] [facility];  Service: Cardiovascular;  Laterality: Left;  unable to cross lt sfa occlusion   PERIPHERAL VASCULAR INTERVENTION  04/13/2017   Procedure: Peripheral Vascular Intervention;  Surgeon: [name], MD;  Location: [place] [facility];  Service: Cardiovascular;;   PRESSURE SENSOR/CARDIOMEMS N/A 02/05/2020   Procedure: PRESSURE SENSOR/CARDIOMEMS;  Surgeon: Larey Dresser, MD;  Location: [place] [facility];  Service: Cardiovascular;  Laterality: N/A;   VEIN HARVEST Left 06/28/2019   Procedure: LEFT LEG GREATER SAPHENOUS VEIN HARVEST;  Surgeon: Rosetta Posner, MD;  Location: [facility];  Service: Vascular;  Laterality: Left;    Social History:  reports that she has quit smoking. Her smoking use included e-cigarettes and cigarettes. She has a 44.00 pack-year smoking history. She has quit using smokeless tobacco.  Her smokeless tobacco use included snuff. She reports current alcohol use. She reports current drug use. Drugs: "Crack" cocaine,  Marijuana, and Oxycodone.   Allergies  Allergen Reactions   Gabapentin Nausea And Vomiting and Other (See Comments)    POSSIBLE SHAKING   Lyrica [Pregabalin] Other (See Comments)    Shaking     Family History  Problem Relation Age of Onset   Hypertension Mother    Diabetes Mother    Cancer Mother        breast, ovarian, colon   Clotting disorder Mother    Heart disease Mother    Heart attack Mother    Breast cancer Mother        in 65's   Hypertension Father    Heart disease Father    Emphysema Sister        smoked      Prior to Admission medications   Medication Sig Start Date End Date Taking? Authorizing Provider  ACCU-CHEK GUIDE test strip USE TO CHECK BLOOD SUGAR FOUR TIMES DAILY 02/25/20   [provider]  acetaminophen (TYLENOL) 500 MG tablet Take 500 mg by mouth every 6 (six) hours as needed for moderate pain.    [provider]  albuterol (VENTOLIN HFA) 108 (90 Base) MCG/ACT inhaler Inhale 2 puffs into the lungs every 6 (six) hours as needed for wheezing or shortness of breath. 07/30/21   Danford, Suann Larry, MD  allopurinol (ZYLOPRIM) 100 MG tablet TAKE 1 TABLET (100 MG TOTAL) BY MOUTH DAILY(AM) 04/07/22   Charlott Rakes, MD  amiodarone (PACERONE) 200 MG tablet TAKE 1/2 TABLET (100 MG TOTAL) BY MOUTH DAILY (AM) 04/07/22   Larey Dresser, MD  amLODipine (NORVASC) 10 MG tablet Take 1 tablet (10 mg total) by mouth daily. 10/21/21   Larey Dresser, MD  apixaban (ELIQUIS) 5 MG TABS tablet Take 1 tablet (5 mg total) by mouth 2 (two) times daily. 12/10/21   Larey Dresser, MD  atorvastatin (LIPITOR) 40 MG tablet TAKE 1 TABLET (40 MG TOTAL) BY MOUTH EVERY EVENING (BEDTIME) 03/14/22   Charlott Rakes, MD  budesonide-formoterol (SYMBICORT) 160-4.5 MCG/ACT inhaler Inhale 2 puffs into the lungs 2 (two) times daily. 12/09/19   Eugenie Filler, MD  buPROPion (WELLBUTRIN SR) 150 MG 12 hr tablet Take 150 mg by mouth 2 (two) times daily.    [provider]  colchicine 0.6 MG tablet TAKE 2 TABS (1.2 MG) AT THE ONSET OF A GOUT FLARE, MAY TAKE 1 TAB (0.6 MG) IN 1 HOUR IF SYMPTOMS Patient taking differently: daily. Take 2 tabs (1.2 mg) at the onset of a gout flare,  may take 1 tab (0.6 mg) in 1 hour if symptoms 12/10/21   Charlott Rakes, MD  Continuous Blood Gluc Sensor (FREESTYLE LIBRE 2 SENSOR) MISC USE 1 (ONE) EACH EVERY 2 WEEKS 07/14/21   Charlott Rakes, MD  Dulaglutide (TRULICITY) 1.5 AL/9.3XT SOPN Inject 1.5 mg into the skin once a week. 08/11/21   Charlott Rakes, MD  ferrous sulfate (FEROSUL) 325 (65 FE) MG tablet TAKE 1 TABLET (324 MG TOTAL) BY MOUTH 2 (TWO) TIMES DAILY AS NEEDED. (AM+BEDTIME) 04/07/22   Charlott Rakes, MD  FLUoxetine (PROZAC) 20 MG capsule TAKE 1 CAPSULE (20 MG TOTAL) BY MOUTH AT BEDTIME. 03/14/22   Charlott Rakes, MD  folic acid (FOLVITE) 1 MG tablet TAKE 1 TABLET (1 MG TOTAL) BY MOUTH DAILY. 12/23/21   Charlott Rakes, MD  hydrALAZINE (APRESOLINE) 50 MG tablet Take 1.5 tablets (75 mg total) by mouth 3 (three) times daily. 01/31/22   [place], Maricela Bo, FNP  insulin glargine (LANTUS) 100 UNIT/ML Solostar Pen Inject 30 Units into the skin daily. 07/14/21 07/14/22  Charlott Rakes, MD  insulin lispro (HUMALOG) 100 UNIT/ML KwikPen Inject 8 Units into the skin 3 (three) times daily with meals. 01/05/21   Allie Bossier, MD  Insulin Pen Needle (EASY COMFORT PEN NEEDLES) 31G X 5 MM MISC USE FOUR TIMES A DAY FOR INSULIN ADMINISTRATION 04/08/22   Charlott Rakes, MD  JARDIANCE 10 MG TABS tablet TAKE 1 TABLET (10 MG TOTAL) BY MOUTH DAILY BEFORE BREAKFAST (AM) 04/07/22   Bensimhon, Shaune Pascal, MD  levothyroxine (SYNTHROID) 50 MCG tablet TAKE 1 TABLET (50 MCG TOTAL) BY MOUTH DAILY (AM) 03/14/22   Charlott Rakes, MD  losartan (COZAAR) 25 MG tablet TAKE 2 TABLETS (50 MG TOTAL) BY MOUTH DAILY (AM) 04/07/22   Charlott Rakes, MD  Omega-3 Fatty Acids (FISH OIL PO) Take 1 capsule by mouth daily.    [provider]  ondansetron (ZOFRAN) 4 MG tablet  Take 1 tablet (4 mg total) by mouth every 8 (eight) hours as needed for nausea or vomiting. Patient not taking: Reported on 03/16/2022 08/11/21   Charlott Rakes, MD  pantoprazole (PROTONIX) 40 MG tablet TAKE 1 TABLET (40 MG TOTAL) BY MOUTH DAILY(AM) 04/07/22   Charlott Rakes, MD  spironolactone (ALDACTONE) 25 MG tablet Take 0.5 tablets (12.5 mg total) by mouth daily. 04/04/22   Larey Dresser, MD  torsemide (DEMADEX) 20 MG tablet Take 80 mg in am and 60 mg in pm 04/06/22   Larey Dresser, MD  VITAMIN D PO Take 1 tablet by mouth daily.    [provider]  tiotropium (SPIRIVA HANDIHALER) 18 MCG inhalation capsule Place 1 capsule (18 mcg total) into inhaler and inhale every morning. Patient not taking: Reported on 02/09/2021 12/09/19 02/09/21  Eugenie Filler, MD    Physical Exam: BP (!) 131/58   Pulse 86   Temp 98.5 F (36.9 C) (Oral)   Resp (!) 22   LMP  (LMP Unknown)   SpO2 93%   General: 61 y.o. year-old female well developed well nourished in no acute distress.  Somnolent, arouses to voices and oriented x3. Cardiovascular: Regular rate and rhythm with no rubs or gallops.  No thyromegaly or JVD noted.  Trace lower extremity edema. Respiratory: Clear to auscultation with no wheezes or rales. Good inspiratory effort. Abdomen: Soft nontender nondistended with normal bowel sounds x4 quadrants. Muskuloskeletal: No cyanosis or clubbing.  Trace edema noted in lower extremities bilaterally Neuro: CN II-XII intact, strength, sensation, reflexes Skin: No ulcerative lesions noted  or rashes.  Mild bilateral erythema, nontender, involving lower extremities suspect from chronic venous insufficiency. Psychiatry: Judgement and insight appear normal. Mood is appropriate for condition and setting          Labs on Admission:  Basic Metabolic Panel: Recent Labs  Lab 04/17/22 2338  NA 136  K 2.7*  CL 98  CO2 26  GLUCOSE 153*  BUN 21*  CREATININE 1.43*  CALCIUM 9.0  MG 1.4*   Liver  Function Tests: Recent Labs  Lab 04/17/22 2338  AST 46*  ALT 53*  ALKPHOS 107  BILITOT 0.5  PROT 6.7  ALBUMIN 3.4*   Recent Labs  Lab 04/17/22 2338  LIPASE 28   No results for input(s): AMMONIA in the last 168 hours. CBC: Recent Labs  Lab 04/17/22 2338  WBC 30.0*  HGB 13.0  HCT 40.1  MCV 94.8  PLT 245   Cardiac Enzymes: No results for input(s): CKTOTAL, CKMB, CKMBINDEX, TROPONINI in the last 168 hours.  BNP (last 3 results) Recent Labs    01/31/22 1406 04/04/22 1507 04/17/22 2338  BNP 238.7* 81.3 235.6*    ProBNP (last 3 results) No results for input(s): PROBNP in the last 8760 hours.  CBG: Recent Labs  Lab 04/17/22 2328  GLUCAP 174*    Radiological Exams on Admission: CT ABDOMEN PELVIS W CONTRAST  Result Date: 04/18/2022 CLINICAL DATA:  Left lower quadrant pain. EXAM: CT ABDOMEN AND PELVIS WITH CONTRAST TECHNIQUE: Multidetector CT imaging of the abdomen and pelvis was performed using the standard protocol following bolus administration of intravenous contrast. RADIATION DOSE REDUCTION: This exam was performed according to the departmental dose-optimization program which includes automated exposure control, adjustment of the mA and/or kV according to patient size and/or use of iterative reconstruction technique. CONTRAST:  35mL OMNIPAQUE IOHEXOL 300 MG/ML  SOLN COMPARISON:  None Available. FINDINGS: Lower chest: No acute abnormality. Hepatobiliary: No focal liver abnormality is seen. No gallstones, gallbladder wall thickening, or biliary dilatation. Pancreas: Unremarkable. No pancreatic ductal dilatation or surrounding inflammatory changes. Spleen: Normal in size without focal abnormality. Adrenals/Urinary Tract: Adrenal glands are unremarkable. Kidneys are normal in size, without renal calculi or hydronephrosis. A 2.0 cm fat density lesion is seen within the anterior aspect of the mid to lower right kidney (approximately -70.25 Hounsfield units). Bladder is  unremarkable. Stomach/Bowel: Stomach is within normal limits. Appendix appears normal. No evidence of bowel wall thickening, distention, or inflammatory changes. Vascular/Lymphatic: Aortic atherosclerosis. No enlarged abdominal or pelvic lymph nodes. Reproductive: Uterus and bilateral adnexa are unremarkable. Other: No abdominal wall hernia or abnormality. No abdominopelvic ascites. Musculoskeletal: No acute or significant osseous findings. IMPRESSION: 1. No acute or active process within the abdomen or pelvis. 2. 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 3. Aortic atherosclerosis. Aortic Atherosclerosis (ICD10-I70.0). Electronically Signed   By: Virgina Norfolk M.D.   On: 04/18/2022 02:52   DG Chest Port 1 View  Result Date: 04/18/2022 CLINICAL DATA:  Cough and shortness of breath. EXAM: PORTABLE CHEST 1 VIEW COMPARISON:  None Available. FINDINGS: The heart size and mediastinal contours are within normal limits. A CardioMEMS device is seen projecting over the left hilum. Both lungs are clear. The visualized skeletal structures are unremarkable. IMPRESSION: No active disease. Electronically Signed   By: Virgina Norfolk M.D.   On: 04/18/2022 02:14    EKG: I independently viewed the EKG done and my findings are as followed: Sinus rhythm rate of 84 QTc 492    Assessment/Plan Present on Admission:  Gastroenteritis  Principal Problem:  Gastroenteritis  Suspected viral gastroenteritis, POA Rule out bacterial infection Presented with nausea vomiting and diarrhea x4 days Use of oral antibiotics about a month ago, unclear which antibiotic she was prescribed. C. difficile PCR, GI panel by PCR pending Encourage oral intake to avoid dehydration. IV Antiemetics as needed Add as needed Imodium if C. difficile PCR is negative  HFpEF 55 to 60% with grade 2 diastolic dysfunction No evidence of acute CHF Resume home regimen Start strict I's and O's and daily weight  Electrolytes disturbances, likely from  GI losses Presented with serum potassium 2.7 and magnesium 1.4 Repleted intravenously and orally Repeat chemistry panel in the morning  Intractable nausea and vomiting IV Compazine as needed Encourage oral intake as tolerated  Paroxysmal A-fib Rate is controlled Resume home regimen On Eliquis for CVA prevention  CKD 3B Appears to be at her baseline creatinine 1.4 with GFR 42 Continue to avoid nephrotoxic agent, and hypotension. Monitor urine output with strict I's and O's  Prolonged QTc Admission twelve-lead EKG with QTc greater than 590 Avoid QTc prolonging agents Optimize magnesium and potassium levels Repeat twelve-lead EKG in the morning  Type 2 diabetes with hyperglycemia Hemoglobin A1c 10.3 Start insulin sliding scale.  Elevated liver chemistries AST and ALT are elevated Abdominal exam is benign Monitor for now, repeat chemistry panel in the morning  Chronic anxiety/depression Resume home regimen  Gout Resume home regimen  GERD Resume home regimen     DVT prophylaxis: Eliquis  Code Status: Full code  Family Communication: None at bedside  Disposition Plan: Admitted to telemetry medical unit  Consults called: None  Admission status: Inpatient status.   Status is: Inpatient The patient will require at least 2 midnights for further evaluation and treatment of present condition.   Kayleen Memos MD [facility] Pager [phone]  If 7PM-7AM, please contact night-coverage [email or web address] Password [facility]  04/18/2022, 3:33 AM

## 2022-04-19 ENCOUNTER — Encounter (HOSPITAL_COMMUNITY): Payer: Self-pay | Admitting: Internal Medicine

## 2022-04-19 DIAGNOSIS — A084 Viral intestinal infection, unspecified: Secondary | ICD-10-CM | POA: Diagnosis not present

## 2022-04-19 LAB — CBC
HCT: 39.1 % (ref 36.0–46.0)
Hemoglobin: 12.2 g/dL (ref 12.0–15.0)
MCH: 30.4 pg (ref 26.0–34.0)
MCHC: 31.2 g/dL (ref 30.0–36.0)
MCV: 97.5 fL (ref 80.0–100.0)
Platelets: 203 10*3/uL (ref 150–400)
RBC: 4.01 MIL/uL (ref 3.87–5.11)
RDW: 14 % (ref 11.5–15.5)
WBC: 28 10*3/uL — ABNORMAL HIGH (ref 4.0–10.5)
nRBC: 0 % (ref 0.0–0.2)

## 2022-04-19 LAB — COMPREHENSIVE METABOLIC PANEL
ALT: 51 U/L — ABNORMAL HIGH (ref 0–44)
AST: 41 U/L (ref 15–41)
Albumin: 2.6 g/dL — ABNORMAL LOW (ref 3.5–5.0)
Alkaline Phosphatase: 120 U/L (ref 38–126)
Anion gap: 10 (ref 5–15)
BUN: 24 mg/dL — ABNORMAL HIGH (ref 6–20)
CO2: 25 mmol/L (ref 22–32)
Calcium: 9 mg/dL (ref 8.9–10.3)
Chloride: 98 mmol/L (ref 98–111)
Creatinine, Ser: 1.52 mg/dL — ABNORMAL HIGH (ref 0.44–1.00)
GFR, Estimated: 39 mL/min — ABNORMAL LOW (ref >60)
Glucose, Bld: 202 mg/dL — ABNORMAL HIGH (ref 70–99)
Potassium: 4.5 mmol/L (ref 3.5–5.1)
Sodium: 133 mmol/L — ABNORMAL LOW (ref 135–145)
Total Bilirubin: 0.8 mg/dL (ref 0.3–1.2)
Total Protein: 6.2 g/dL — ABNORMAL LOW (ref 6.5–8.1)

## 2022-04-19 LAB — GASTROINTESTINAL PANEL BY PCR, STOOL (REPLACES STOOL CULTURE)

## 2022-04-19 LAB — GLUCOSE, CAPILLARY
Glucose-Capillary: 191 mg/dL — ABNORMAL HIGH (ref 70–99)
Glucose-Capillary: 375 mg/dL — ABNORMAL HIGH (ref 70–99)
Glucose-Capillary: 218 mg/dL — ABNORMAL HIGH (ref 70–99)
Glucose-Capillary: 277 mg/dL — ABNORMAL HIGH (ref 70–99)

## 2022-04-19 LAB — MAGNESIUM: Magnesium: 2.4 mg/dL (ref 1.7–2.4)

## 2022-04-19 MED ORDER — INSULIN GLARGINE-YFGN 100 UNIT/ML ~~LOC~~ SOLN
15.0000 [IU] | Freq: Every day | SUBCUTANEOUS | Status: DC
  Administered 2022-04-19: 15 [IU] via SUBCUTANEOUS
  Filled 2022-04-19 (×2): qty 0.15

## 2022-04-19 MED ORDER — INSULIN GLARGINE-YFGN 100 UNIT/ML ~~LOC~~ SOLN
5.0000 [IU] | SUBCUTANEOUS | Status: AC
  Administered 2022-04-19: 5 [IU] via SUBCUTANEOUS
  Filled 2022-04-19 (×2): qty 0.05

## 2022-04-19 MED ORDER — LIVING WELL WITH DIABETES BOOK
Freq: Once | Status: AC
Start: 2022-04-19 — End: 2022-04-19
  Filled 2022-04-19 (×2): qty 1

## 2022-04-19 NOTE — Progress Notes (Addendum)
Inpatient Diabetes Program Recommendations  AACE/ADA: New Consensus Statement on Inpatient Glycemic Control (2015)  Target Ranges:  Prepandial:   less than 140 mg/dL      Peak postprandial:   less than 180 mg/dL (1-2 hours)      Critically ill patients:  140 - 180 mg/dL   Lab Results  Component Value Date   GLUCAP 191 (H) 04/19/2022   HGBA1C 10.3 (H) 04/18/2022    Review of Glycemic Control  Latest Reference Range & Units 04/17/22 23:28 04/18/22 07:36 04/18/22 11:48 04/18/22 17:32 04/18/22 21:09 04/19/22 07:47  Glucose-Capillary 70 - 99 mg/dL 174 (H) 204 (H) 237 (H) 186 (H) 219 (H) 191 (H)   Diabetes history: DM 2 Outpatient Diabetes medications:  Trulicity 0.35 mg q Friday Lantus 38 units q HS Humalog 8 units tid with meals Jardiance 10 mg daily Current orders for Inpatient glycemic control:  Novolog moderate tid with meals and HS  Inpatient Diabetes Program Recommendations:    Please consider adding Semglee 18 units q HS (approx. 1/2 of home dose).    Thanks,  Adah Perl, RN, BC-ADM Inpatient Diabetes Coordinator Pager [phone]  (8a-5p)  Addendum:  spoke with patient regarding A1C of 10.3%.  Note this is increased from last year (9.7% in 04/2021).  She states that she is taking medications for DM as prescribed.  She states that she does not think she is eating as well and wanted more information regarding diet.  Reminded her to increase intake of non-starchy veggies and reduce intake of pastas,rice, potatoes, etc.  Will order LWWD booklet for patient as well as attach diet information to AVS.  Encouraged patient to continue monitoring closely and that if blood sugars>200 mg/dL, may need adjustment in home meds.  Patient verbalized understanding.

## 2022-04-19 NOTE — Progress Notes (Addendum)
PROGRESS NOTE    [name]  IRW:[id number] DOB: 08-01-1961 DOA: 04/17/2022 PCP: Charlott Rakes, MD   Brief Narrative:  [name] is a 61 y.o. female with medical history significant for HFpEF 48 to 60% with grade 2 diastolic dysfunction, hyperlipidemia, peripheral artery disease, who presented to [facility] ED from home with complaints of intractable nausea, vomiting, and diarrhea for 4 days with worsening symptoms of weakness dehydration and general malaise.   \Assessment & Plan:   Principal Problem:   Viral gastroenteritis Active Problems:   DM (diabetes mellitus), type 2 with peripheral vascular complications ([name])   Long term current use of anticoagulant therapy   Hypothyroidism   Hypokalemia   Chronic combined systolic and diastolic CHF (congestive heart failure) ([name])   Severe obesity (BMI >= 40) ([name])   Depression   CAD (coronary artery disease)   Low back pain   Morbid obesity ([name])   Stage 3b chronic kidney disease ([place])   Uncontrolled type 2 diabetes mellitus with hyperglycemia ([name])  Sepsis secondary to suspected viral gastroenteritis, POA Intractable nausea vomiting diarrhea, resolving - GI Panel, C. difficile, COVID negative - Symptoms ongoing although minimally improved -Flexi-Seal ordered yesterday in the ED, not placed -no clear indication why - Today given improvement in patient's output, less watery and somewhat soft we will hold off on Flexi-Seal, certainly if diarrhea worsens and becomes more liquidy would place Flexi-Seal for comfort -Sepsis criteria including leukocytosis, tachycardia with obvious source of diarrhea at intake -Continue to hold antibiotics -Continue supportive care, nausea vomiting resolved at this point, minimal diarrhea over the past 24 hours -Avoid motility agents in the setting of likely viral gastroenteritis  HFpEF 55 to 60% with grade 2 diastolic dysfunction -Currently holding home diuretics and hypertensive medications in the  setting of dehydration and hypovolemia secondary to above  Hypovolemia, dehydration, electrolytes disturbances -Secondary to GI losses as above, replete as appropriate, potassium stable at this point  Paroxysmal A-fib -Continue home Eliquis, amiodarone for rate control, holding beta-blockers, Entresto/similar in the setting of borderline hypotension   CKD 3B -Continue to follow clinically, creatinine appears to be at baseline around 1.5  Prolonged Qtc -Follow along, likely secondary to electrolyte disturbances  Type 2 diabetes uncontrolled, A1c greater than 10 with hyperglycemia -Continue sliding scale insulin, follow clinically  -Add HS glargine tonight and follow clinically -Home regimen Humalog 8 units 3 times daily with meals, Lantus 38u daily  Elevated liver enzymes -Secondary to above dehydration hypovolemia, improving with IV fluids and increase p.o. intake  Chronic anxiety/depression -Continue home regimen including Prozac, Wellbutrin  Gout -Not currently in acute exacerbation, hold nonessential meds   GERD No acute issues or events overnight  DVT prophylaxis: Eliquis Code Status: Full Family Communication: None present  Status is: Inpatient  Dispo: The patient is from: Home              Anticipated d/c is to: Home              Anticipated d/c date is: 24 to 48 hours              Patient currently not medically stable for discharge  Consultants:  None  Procedures:  None  Antimicrobials:  Discontinued  Subjective: No acute issues or events overnight diarrhea somewhat improving over the past 24 hours denies overt nausea vomiting constipation headache fevers chills chest pain  Objective: Vitals:   04/19/22 0453 04/19/22 0746 04/19/22 0800 04/19/22 1145  BP:   (!) 147/68 123/71  Pulse: 99     Resp: 20 16    Temp:  99.5 F (37.5 C)  98.9 F (37.2 C)  TempSrc:  Oral  Oral  SpO2: 91%  93%   Weight: 104.5 kg     Height:        Intake/Output Summary  (Last 24 hours) at 04/19/2022 1552 Last data filed at 04/19/2022 1500 Gross per 24 hour  Intake 960 ml  Output 350 ml  Net 610 ml   Filed Weights   04/18/22 2018 04/19/22 0453  Weight: 103 kg 104.5 kg    Examination:  General exam: Appears calm and comfortable  Respiratory system: Clear to auscultation. Respiratory effort normal. Cardiovascular system: S1 & S2 heard, RRR. No JVD, murmurs, rubs, gallops or clicks. No pedal edema. Gastrointestinal system: Abdomen is nondistended, soft and nontender. No organomegaly or masses felt. Normal bowel sounds heard. Central nervous system: Alert and oriented. No focal neurological deficits. Extremities: Symmetric 5 x 5 power. Skin: No rashes, lesions or ulcers Psychiatry: Judgement and insight appear normal. Mood & affect appropriate.     Data Reviewed: I have personally reviewed following labs and imaging studies  CBC: Recent Labs  Lab 04/17/22 2338 04/18/22 1140 04/19/22 0207  WBC 30.0* 26.2* 28.0*  NEUTROABS  --  24.1*  --   HGB 13.0 12.3 12.2  HCT 40.1 38.6 39.1  MCV 94.8 96.7 97.5  PLT 245 217 967   Basic Metabolic Panel: Recent Labs  Lab 04/17/22 2338 04/18/22 1140 04/19/22 0207  NA 136 135 133*  K 2.7* 3.6 4.5  CL 98 100 98  CO2 26 24 25   GLUCOSE 153* 226* 202*  BUN 21* 20 24*  CREATININE 1.43* 1.34* 1.52*  CALCIUM 9.0 8.7* 9.0  MG 1.4* 2.0 2.4  PHOS  --  2.7  --    GFR: Estimated Creatinine Clearance: 46.9 mL/min (A) (by C-G formula based on SCr of 1.52 mg/dL (H)). Liver Function Tests: Recent Labs  Lab 04/17/22 2338 04/18/22 1140 04/19/22 0207  AST 46* 46* 41  ALT 53* 53* 51*  ALKPHOS 107 116 120  BILITOT 0.5 0.6 0.8  PROT 6.7 6.2* 6.2*  ALBUMIN 3.4* 3.0* 2.6*   Recent Labs  Lab 04/17/22 2338  LIPASE 28   No results for input(s): AMMONIA in the last 168 hours. Coagulation Profile: No results for input(s): INR, PROTIME in the last 168 hours. Cardiac Enzymes: No results for input(s): CKTOTAL,  CKMB, CKMBINDEX, TROPONINI in the last 168 hours. BNP (last 3 results) No results for input(s): PROBNP in the last 8760 hours. HbA1C: Recent Labs    04/18/22 0428  HGBA1C 10.3*   CBG: Recent Labs  Lab 04/18/22 1148 04/18/22 1732 04/18/22 2109 04/19/22 0747 04/19/22 1147  GLUCAP 237* 186* 219* 191* 375*   Lipid Profile: No results for input(s): CHOL, HDL, LDLCALC, TRIG, CHOLHDL, LDLDIRECT in the last 72 hours. Thyroid Function Tests: No results for input(s): TSH, T4TOTAL, FREET4, T3FREE, THYROIDAB in the last 72 hours. Anemia Panel: No results for input(s): VITAMINB12, FOLATE, FERRITIN, TIBC, IRON, RETICCTPCT in the last 72 hours. Sepsis Labs: No results for input(s): PROCALCITON, LATICACIDVEN in the last 168 hours.  Recent Results (from the past 240 hour(s))  SARS Coronavirus 2 by RT PCR (hospital order, performed in [facility] hospital lab) *cepheid single result test* Anterior Nasal Swab     Status: None   Collection Time: 04/18/22  1:47 AM   Specimen: Anterior Nasal Swab  Result Value Ref Range Status  SARS Coronavirus 2 by RT PCR NEGATIVE NEGATIVE Final    Comment: (NOTE) SARS-CoV-2 target nucleic acids are NOT DETECTED.  The SARS-CoV-2 RNA is generally detectable in upper and lower respiratory specimens during the acute phase of infection. The lowest concentration of SARS-CoV-2 viral copies this assay can detect is 250 copies / mL. A negative result does not preclude SARS-CoV-2 infection and should not be used as the sole basis for treatment or other patient management decisions.  A negative result may occur with improper specimen collection / handling, submission of specimen other than nasopharyngeal swab, presence of viral mutation(s) within the areas targeted by this assay, and inadequate number of viral copies (<250 copies / mL). A negative result must be combined with clinical observations, patient history, and epidemiological information.  Fact Sheet for  Patients:   https://www.patel.info/  Fact Sheet for Healthcare Providers: https://hall.com/  This test is not yet approved or  cleared by the [place] FDA and has been authorized for detection and/or diagnosis of SARS-CoV-2 by FDA under an Emergency Use Authorization (EUA).  This EUA will remain in effect (meaning this test can be used) for the duration of the COVID-19 declaration under Section 564(b)(1) of the Act, 21 U.S.C. section 360bbb-3(b)(1), unless the authorization is terminated or revoked sooner.  Performed at [facility], [place] [address], [place], [place] [address]   C Difficile Quick Screen w PCR reflex     Status: None   Collection Time: 04/18/22  1:47 AM   Specimen: Stool  Result Value Ref Range Status   C Diff antigen NEGATIVE NEGATIVE Final   C Diff toxin NEGATIVE NEGATIVE Final   C Diff interpretation No C. difficile detected.  Final    Comment: Performed at [facility], [place] [address] [place], [place] [address]  Gastrointestinal Panel by PCR , Stool     Status: None   Collection Time: 04/18/22  3:23 AM   Specimen: Stool  Result Value Ref Range Status   Campylobacter species NOT DETECTED NOT DETECTED Final   Plesimonas shigelloides NOT DETECTED NOT DETECTED Final   Salmonella species NOT DETECTED NOT DETECTED Final   Yersinia enterocolitica NOT DETECTED NOT DETECTED Final   Vibrio species NOT DETECTED NOT DETECTED Final   Vibrio cholerae NOT DETECTED NOT DETECTED Final   Enteroaggregative E coli (EAEC) NOT DETECTED NOT DETECTED Final   Enteropathogenic E coli (EPEC) NOT DETECTED NOT DETECTED Final   Enterotoxigenic E coli (ETEC) NOT DETECTED NOT DETECTED Final   Shiga like toxin producing E coli (STEC) NOT DETECTED NOT DETECTED Final   Shigella/Enteroinvasive E coli (EIEC) NOT DETECTED NOT DETECTED Final   Cryptosporidium NOT DETECTED NOT DETECTED Final   Cyclospora cayetanensis NOT DETECTED NOT  DETECTED Final   Entamoeba histolytica NOT DETECTED NOT DETECTED Final   Giardia lamblia NOT DETECTED NOT DETECTED Final   Adenovirus F40/41 NOT DETECTED NOT DETECTED Final   Astrovirus NOT DETECTED NOT DETECTED Final   Norovirus GI/GII NOT DETECTED NOT DETECTED Final   Rotavirus A NOT DETECTED NOT DETECTED Final   Sapovirus (I, II, IV, and V) NOT DETECTED NOT DETECTED Final    Comment: Performed at [facility], [address] [place], [place] [address]  Respiratory (~20 pathogens) panel by PCR     Status: None   Collection Time: 04/18/22 11:29 AM   Specimen: Nasopharyngeal Swab; Respiratory  Result Value Ref Range Status   Adenovirus NOT DETECTED NOT DETECTED Final   Coronavirus 229E NOT  DETECTED NOT DETECTED Final    Comment: (NOTE) The Coronavirus on the Respiratory Panel, DOES NOT test for the novel  Coronavirus (2019 nCoV)    Coronavirus HKU1 NOT DETECTED NOT DETECTED Final   Coronavirus NL63 NOT DETECTED NOT DETECTED Final   Coronavirus OC43 NOT DETECTED NOT DETECTED Final   Metapneumovirus NOT DETECTED NOT DETECTED Final   Rhinovirus / Enterovirus NOT DETECTED NOT DETECTED Final   Influenza A NOT DETECTED NOT DETECTED Final   Influenza B NOT DETECTED NOT DETECTED Final   Parainfluenza Virus 1 NOT DETECTED NOT DETECTED Final   Parainfluenza Virus 2 NOT DETECTED NOT DETECTED Final   Parainfluenza Virus 3 NOT DETECTED NOT DETECTED Final   Parainfluenza Virus 4 NOT DETECTED NOT DETECTED Final   Respiratory Syncytial Virus NOT DETECTED NOT DETECTED Final   Bordetella pertussis NOT DETECTED NOT DETECTED Final   Bordetella Parapertussis NOT DETECTED NOT DETECTED Final   Chlamydophila pneumoniae NOT DETECTED NOT DETECTED Final   Mycoplasma pneumoniae NOT DETECTED NOT DETECTED Final    Comment: Performed at [facility], [place] [address], [place], [place] [address]         Radiology Studies: CT ABDOMEN PELVIS W CONTRAST  Result Date: 04/18/2022 CLINICAL DATA:   Left lower quadrant pain. EXAM: CT ABDOMEN AND PELVIS WITH CONTRAST TECHNIQUE: Multidetector CT imaging of the abdomen and pelvis was performed using the standard protocol following bolus administration of intravenous contrast. RADIATION DOSE REDUCTION: This exam was performed according to the departmental dose-optimization program which includes automated exposure control, adjustment of the mA and/or kV according to patient size and/or use of iterative reconstruction technique. CONTRAST:  22mL OMNIPAQUE IOHEXOL 300 MG/ML  SOLN COMPARISON:  None Available. FINDINGS: Lower chest: No acute abnormality. Hepatobiliary: No focal liver abnormality is seen. No gallstones, gallbladder wall thickening, or biliary dilatation. Pancreas: Unremarkable. No pancreatic ductal dilatation or surrounding inflammatory changes. Spleen: Normal in size without focal abnormality. Adrenals/Urinary Tract: Adrenal glands are unremarkable. Kidneys are normal in size, without renal calculi or hydronephrosis. A 2.0 cm fat density lesion is seen within the anterior aspect of the mid to lower right kidney (approximately -70.25 Hounsfield units). Bladder is unremarkable. Stomach/Bowel: Stomach is within normal limits. Appendix appears normal. No evidence of bowel wall thickening, distention, or inflammatory changes. Vascular/Lymphatic: Aortic atherosclerosis. No enlarged abdominal or pelvic lymph nodes. Reproductive: Uterus and bilateral adnexa are unremarkable. Other: No abdominal wall hernia or abnormality. No abdominopelvic ascites. Musculoskeletal: No acute or significant osseous findings. IMPRESSION: 1. No acute or active process within the abdomen or pelvis. 2. 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 3. Aortic atherosclerosis. Aortic Atherosclerosis (ICD10-I70.0). Electronically Signed   By: Virgina Norfolk M.D.   On: 04/18/2022 02:52   DG Chest Port 1 View  Result Date: 04/18/2022 CLINICAL DATA:  Cough and shortness of breath. EXAM:  PORTABLE CHEST 1 VIEW COMPARISON:  None Available. FINDINGS: The heart size and mediastinal contours are within normal limits. A CardioMEMS device is seen projecting over the left hilum. Both lungs are clear. The visualized skeletal structures are unremarkable. IMPRESSION: No active disease. Electronically Signed   By: Virgina Norfolk M.D.   On: 04/18/2022 02:14    Scheduled Meds:  amiodarone  100 mg Oral q morning   apixaban  5 mg Oral BID   atorvastatin  40 mg Oral Daily   buPROPion  150 mg Oral BID   FLUoxetine  20 mg Oral QHS   fluticasone furoate-vilanterol  1 puff Inhalation Daily   insulin  aspart  0-15 Units Subcutaneous TID WC   insulin aspart  0-5 Units Subcutaneous QHS   levothyroxine  50 mcg Oral Q0600   living well with diabetes book   Does not apply Once   Continuous Infusions:  sodium chloride Stopped (04/18/22 1855)     LOS: 1 day   Time spent: 42min  Maycen Degregory C Cleona Doubleday, DO [facility]  If 7PM-7AM, please contact night-coverage [email or web address]  04/19/2022, 3:52 PM

## 2022-04-19 NOTE — Progress Notes (Signed)
Heart Failure Navigator Progress Note  Assessed for Heart & Vascular TOC clinic readiness.  Patient does not meet criteria at this time due to no acute exacerbation.     Earnestine Leys, BSN, Clinical cytogeneticist Only

## 2022-04-20 ENCOUNTER — Ambulatory Visit: Payer: Medicare Other | Admitting: Cardiovascular Disease

## 2022-04-20 DIAGNOSIS — E876 Hypokalemia: Secondary | ICD-10-CM | POA: Diagnosis not present

## 2022-04-20 DIAGNOSIS — E1151 Type 2 diabetes mellitus with diabetic peripheral angiopathy without gangrene: Secondary | ICD-10-CM | POA: Diagnosis not present

## 2022-04-20 DIAGNOSIS — I5042 Chronic combined systolic (congestive) and diastolic (congestive) heart failure: Secondary | ICD-10-CM

## 2022-04-20 DIAGNOSIS — A084 Viral intestinal infection, unspecified: Secondary | ICD-10-CM | POA: Diagnosis not present

## 2022-04-20 LAB — GLUCOSE, CAPILLARY
Glucose-Capillary: 355 mg/dL — ABNORMAL HIGH (ref 70–99)
Glucose-Capillary: 210 mg/dL — ABNORMAL HIGH (ref 70–99)
Glucose-Capillary: 303 mg/dL — ABNORMAL HIGH (ref 70–99)
Glucose-Capillary: 267 mg/dL — ABNORMAL HIGH (ref 70–99)

## 2022-04-20 LAB — COMPREHENSIVE METABOLIC PANEL
ALT: 42 U/L (ref 0–44)
AST: 28 U/L (ref 15–41)
Albumin: 2.8 g/dL — ABNORMAL LOW (ref 3.5–5.0)
Alkaline Phosphatase: 128 U/L — ABNORMAL HIGH (ref 38–126)
Anion gap: 12 (ref 5–15)
BUN: 40 mg/dL — ABNORMAL HIGH (ref 6–20)
CO2: 23 mmol/L (ref 22–32)
Calcium: 9 mg/dL (ref 8.9–10.3)
Chloride: 95 mmol/L — ABNORMAL LOW (ref 98–111)
Creatinine, Ser: 1.9 mg/dL — ABNORMAL HIGH (ref 0.44–1.00)
GFR, Estimated: 30 mL/min — ABNORMAL LOW (ref >60)
Glucose, Bld: 195 mg/dL — ABNORMAL HIGH (ref 70–99)
Potassium: 4.4 mmol/L (ref 3.5–5.1)
Sodium: 130 mmol/L — ABNORMAL LOW (ref 135–145)
Total Bilirubin: 0.5 mg/dL (ref 0.3–1.2)
Total Protein: 6.4 g/dL — ABNORMAL LOW (ref 6.5–8.1)

## 2022-04-20 LAB — CBC
HCT: 36.5 % (ref 36.0–46.0)
Hemoglobin: 12.2 g/dL (ref 12.0–15.0)
MCH: 31 pg (ref 26.0–34.0)
MCHC: 33.4 g/dL (ref 30.0–36.0)
MCV: 92.9 fL (ref 80.0–100.0)
Platelets: 174 10*3/uL (ref 150–400)
RBC: 3.93 MIL/uL (ref 3.87–5.11)
RDW: 13.8 % (ref 11.5–15.5)
WBC: 17 10*3/uL — ABNORMAL HIGH (ref 4.0–10.5)
nRBC: 0 % (ref 0.0–0.2)

## 2022-04-20 LAB — MAGNESIUM: Magnesium: 2.6 mg/dL — ABNORMAL HIGH (ref 1.7–2.4)

## 2022-04-20 MED ORDER — ALBUTEROL SULFATE (2.5 MG/3ML) 0.083% IN NEBU
2.5000 mg | INHALATION_SOLUTION | Freq: Four times a day (QID) | RESPIRATORY_TRACT | Status: DC | PRN
  Administered 2022-04-20: 2.5 mg via RESPIRATORY_TRACT
  Filled 2022-04-20: qty 3

## 2022-04-20 MED ORDER — DIPHENHYDRAMINE HCL 25 MG PO CAPS
25.0000 mg | ORAL_CAPSULE | Freq: Once | ORAL | Status: AC | PRN
  Administered 2022-04-20: 25 mg via ORAL
  Filled 2022-04-20: qty 1

## 2022-04-20 MED ORDER — AMLODIPINE BESYLATE 5 MG PO TABS
5.0000 mg | ORAL_TABLET | Freq: Every day | ORAL | Status: DC
  Administered 2022-04-20 – 2022-04-22 (×3): 5 mg via ORAL
  Filled 2022-04-20 (×3): qty 1

## 2022-04-20 MED ORDER — MELATONIN 3 MG PO TABS
3.0000 mg | ORAL_TABLET | Freq: Every day | ORAL | Status: DC
  Administered 2022-04-20 – 2022-04-21 (×2): 3 mg via ORAL
  Filled 2022-04-20 (×2): qty 1

## 2022-04-20 MED ORDER — INSULIN GLARGINE-YFGN 100 UNIT/ML ~~LOC~~ SOLN
25.0000 [IU] | Freq: Every day | SUBCUTANEOUS | Status: DC
  Administered 2022-04-20 – 2022-04-21 (×2): 25 [IU] via SUBCUTANEOUS
  Filled 2022-04-20 (×3): qty 0.25

## 2022-04-20 MED ORDER — ALLOPURINOL 100 MG PO TABS
100.0000 mg | ORAL_TABLET | Freq: Every day | ORAL | Status: DC
  Administered 2022-04-20 – 2022-04-22 (×3): 100 mg via ORAL
  Filled 2022-04-20 (×3): qty 1

## 2022-04-20 MED ORDER — TORSEMIDE 20 MG PO TABS
40.0000 mg | ORAL_TABLET | Freq: Every day | ORAL | Status: DC
  Administered 2022-04-20 – 2022-04-21 (×2): 40 mg via ORAL
  Filled 2022-04-20 (×2): qty 2

## 2022-04-20 MED ORDER — DIPHENHYDRAMINE HCL 25 MG PO CAPS
25.0000 mg | ORAL_CAPSULE | Freq: Four times a day (QID) | ORAL | Status: AC | PRN
  Administered 2022-04-20: 25 mg via ORAL
  Filled 2022-04-20: qty 1

## 2022-04-20 NOTE — TOC Initial Note (Signed)
Transition of Care [facility]) - Initial/Assessment Note    Patient Details  Name: [name] MRN: [id number] Date of Birth: [birth year]  [facility] of [facility]) CM/SW Contact:    Cyndi Bender, RN Phone Number: 04/20/2022, 9:34 AM  Clinical Narrative:                 Spoke to patient regarding transition needs. Patient lives with sister who takes her to appointments. PCP verified. Patient states she has has home 02 in the past. Patient has a wheelchair, walker and cane at home. Sister can transport home when discharged.  TOC will continue to follow for needs. Expected Discharge Plan: Home/Self Care Barriers to Discharge: Continued Medical Work up   Patient Goals and CMS Choice Patient states their goals for this hospitalization and ongoing recovery are:: return home      Expected Discharge Plan and Services Expected Discharge Plan: Home/Self Care       Living arrangements for the past 2 months: Single Family Home                                      Prior Living Arrangements/Services Living arrangements for the past 2 months: Single Family Home Lives with:: Siblings   Do you feel safe going back to the place where you live?: Yes      Need for Family Participation in Patient Care: Yes (Comment) Care giver support system in place?: Yes (comment) Current home services: DME (walker, cane, WC) Criminal Activity/Legal Involvement Pertinent to Current Situation/Hospitalization: No - Comment as needed  Activities of Daily Living Home Assistive Devices/Equipment: Environmental consultant (specify type), Wheelchair, Radio producer (specify quad or straight) ADL Screening (condition at time of admission) Patient's cognitive ability adequate to safely complete daily activities?: Yes Is the patient deaf or have difficulty hearing?: No Does the patient have difficulty seeing, even when wearing glasses/contacts?: No Does the patient have difficulty concentrating, remembering, or making decisions?:  No Patient able to express need for assistance with ADLs?: Yes Does the patient have difficulty dressing or bathing?: No Independently performs ADLs?: No Communication: Independent Dressing (OT): Needs assistance Is this a change from baseline?: Change from baseline, expected to last >3 days Grooming: Independent Feeding: Independent Bathing: Needs assistance Is this a change from baseline?: Change from baseline, expected to last >3 days Toileting: Needs assistance Is this a change from baseline?: Change from baseline, expected to last >3days In/Out Bed: Needs assistance Is this a change from baseline?: Change from baseline, expected to last >3 days Walks in Home: Needs assistance Does the patient have difficulty walking or climbing stairs?: Yes Weakness of Legs: Both Weakness of Arms/Hands: None  Permission Sought/Granted                  Emotional Assessment   Attitude/Demeanor/Rapport: Engaged Affect (typically observed): Accepting Orientation: : Oriented to Self, Oriented to Place, Oriented to  Time, Oriented to Situation Alcohol / Substance Use: Not Applicable Psych Involvement: No (comment)  Admission diagnosis:  Hypokalemia [E87.6] Hypomagnesemia [E83.42] Gastroenteritis [K52.9] Prolonged Q-T interval on ECG [R94.31] Nausea vomiting and diarrhea [R11.2, R19.7] Patient Active Problem List   Diagnosis Date Noted   Viral gastroenteritis 04/18/2022   Hyperlipidemia associated with type 2 diabetes mellitus ([place]) 07/29/2021   Uncontrolled type 2 diabetes mellitus with hyperglycemia ([place]) 06/13/2021   Acute kidney injury superimposed on CKD ([place]) 06/13/2021   Critical ischemia of foot ([place])  06/07/2021   Fall 05/05/2021   PAF (paroxysmal atrial fibrillation) ([place]) 05/05/2021   COPD (chronic obstructive pulmonary disease) ([place]) 05/05/2021   DM (diabetes mellitus), type 2 with complications ([place]) 71/04/2693   PAD (peripheral artery disease) ([place]) 05/05/2021    Cellulitis 05/05/2021   Acute on chronic combined systolic and diastolic CHF (congestive heart failure) ([place]) 05/05/2021   OSA (obstructive sleep apnea) 05/05/2021   Stage 3b chronic kidney disease ([name]) 05/05/2021   AKI (acute kidney injury) ([place]) 05/04/2021   Pressure injury of skin 05/04/2021   Ulcer of foot, unspecified laterality, limited to breakdown of skin ([place]) 02/17/2021   AF (paroxysmal atrial fibrillation) ([place]) 01/02/2021   CAD (coronary artery disease) 01/02/2021   PVD (peripheral vascular disease) ([place]) 01/02/2021   Diabetes mellitus type 2, uncontrolled, with complications 85/46/2703   Diabetic nephropathy ([place]) 01/02/2021   Low back pain 06/01/2020   Hyperlipidemia 04/03/2020   Muscle weakness 03/20/2020   Pain in elbow 03/12/2020   PAD (peripheral artery disease) ([place]) 12/26/2019   Acute renal failure ([name])    Acute respiratory failure with hypoxia ([place]) 12/02/2019   Cellulitis in diabetic foot ([place]) 07/08/2019   Osteomyelitis ([place]) 06/21/2019   NICM (nonischemic cardiomyopathy) ([place]) 06/20/2019   Non-healing ulcer ([place]) 06/20/2019   CHF (congestive heart failure) ([place]) 11/26/2018   Coagulation disorder ([place]) 08/09/2017   Depression 07/21/2017   At risk for adverse drug reaction 06/20/2017   Peripheral neuropathy 06/20/2017   S/P transmetatarsal amputation of foot, right ([place]) 06/05/2017   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 ([place]) 05/19/2017   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[place])    SIRS (systemic inflammatory response syndrome) ([place]) 04/06/2017   Suspected sleep apnea 11/24/2016   Ulcer of toe of right foot, with necrosis of bone ([name]) 10/27/2016   Ulcer of left lower leg ([place]) 05/19/2016   Severe obesity (BMI >= 40) ([place]) 02/24/2016   Morbid obesity ([place]) 02/24/2016   Encounter for therapeutic drug monitoring 02/10/2016   Symptomatic bradycardia 01/12/2016   Hypertension associated with diabetes ([place])  12/22/2015   Chronic combined systolic and diastolic CHF (congestive heart failure) ([name])    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 11/08/2015   Hypokalemia 11/08/2015   Tobacco abuse 10/23/2015   Coronary artery disease    DOE (dyspnea on exertion) 04/29/2015   Diabetes mellitus type 2, uncontrolled 02/08/2015   Influenza A 02/07/2015   Wrist fracture, left, closed, initial encounter 01/29/2015   PAF (paroxysmal atrial fibrillation) ([place]) 01/16/2015   Carotid arterial disease ([place]) 01/16/2015   Claudication ([place]) 01/15/2015   Demand ischemia ([place]) 10/29/2014   Insomnia 02/03/2014   S/P peripheral artery angioplasty - [place]boHawk atherectomy; R SFA 09/11/2013    Class: Acute   Leg pain, bilateral 08/19/2013   Hypothyroidism 07/31/2013   Cellulitis 06/13/2013   History of cocaine abuse ([place]) 06/13/2013   Long term current use of anticoagulant therapy 05/20/2013   Alcohol abuse    Narcotic abuse ([place])    Marijuana abuse    Alcoholic cirrhosis ([place])    DM (diabetes mellitus), type 2 with peripheral vascular complications ([place])    PCP:  Charlott Rakes, MD Pharmacy:   [facility] [address] [place] - [address] [place] [address] [place] [address] Phone: [phone] Fax: [phone]  [facility] [place], [place] - [place] [address] Phone: [phone] Fax: [place], [place] -  [place] [address] Phone: [phone] Fax: [place], [place] - [address] [place] [address] Phone: [phone] Fax: [phone]  [facility] [address] [place] [address] Phone: [phone] Fax: [phone]     Social Determinants of Health (SDOH) Interventions    Readmission Risk Interventions      View : No data to display.

## 2022-04-20 NOTE — Progress Notes (Signed)
PROGRESS NOTE        PATIENT DETAILS Name: [name] Age: 61 y.o. Sex: female Date of Birth: [birth year] Admit Date: 04/17/2022 Admitting Physician Kayleen Memos, DO PZW:CHENID, Charlane Ferretti, MD  Brief Summary: 61 year old female with history of HFpEF, HLD who presented with nausea, vomiting and diarrhea x4 days-subsequently admitted to the hospitalist service for further evaluation and treatment.   Significant events: 6/4>> nausea/vomiting/diarrhea-admit to [facility]  Significant studies: 6/5>> CT abdomen/pelvis: No acute process 6/5>> CXR: No PNA  Significant microbiology data: 6/5>> respiratory virus panel: Negative 6/5>> C. difficile PCR: Negative 6/5>> GI pathogen panel: Negative  Procedures: None  Consults: None   Subjective: Lying comfortably in bed-denies any chest pain or shortness of breath.  No vomiting-last BM was yesterday-still loose.  As of right leg pain and swelling.  Objective: Vitals: Blood pressure (!) 154/71, pulse 82, temperature 99.7 F (37.6 C), temperature source Oral, resp. rate 20, height 5\' 4"  (1.626 m), weight 102.4 kg, SpO2 95 %.   Exam: Gen Exam:Alert awake-not in any distress HEENT:atraumatic, normocephalic Chest: B/L clear to auscultation anteriorly CVS:S1S2 regular Abdomen:soft non tender, non distended Extremities: Right TMA-some mild erythema-and swelling in the calf. Neurology: Non focal Skin: no rash  Pertinent Labs/Radiology:    Latest Ref Rng & Units 04/20/2022    1:34 AM 04/19/2022    2:07 AM 04/18/2022   11:40 AM  CBC  WBC 4.0 - 10.5 K/uL 17.0   28.0   26.2    Hemoglobin 12.0 - 15.0 g/dL 12.2   12.2   12.3    Hematocrit 36.0 - 46.0 % 36.5   39.1   38.6    Platelets 150 - 400 K/uL 174   203   217      Lab Results  Component Value Date   NA 130 (L) 04/20/2022   K 4.4 04/20/2022   CL 95 (L) 04/20/2022   CO2 23 04/20/2022      Assessment/Plan: Sepsis due to viral gastroenteritis: Sepsis  physiology has resolved-GI symptoms rapidly improving.  Leukocytosis downtrending off antimicrobial therapy.  Stool work-up negative so far.  Hypokalemia: Due to GI loss-repleted.  Hyponatremia: Mild-watch closely.  HFpEF: Reasonably well compensated-now that GI illness has resolved-we will resume diuretics at half her home dose.  PAF: Continue amiodarone and Eliquis-continue telemetry monitoring.  History of junctional bradycardia: No longer on Cardizem/beta-blocker.  Continue telemetry monitoring  HTN: BP creeping up-resume amlodipine  HLD: Continue statin  PAD-s/p right TMA  History of carotid stenosis-s/p left CEA  Right leg swelling: Some mild erythema-Doppler RLE ordered.  Already on Eliquis.  CKD stage IIIb: Renal function fluctuating-but not far from baseline.  Follow for now.  DM-2 (A1c 10.3 on 6/5): CBG creeping up-increase Semglee to 25 units daily-continue SSI-follow and optimize.  Recent Labs    04/19/22 2108 04/20/22 0823 04/20/22 1141  GLUCAP 277* 355* 210*    History of diabetic neuropathy  Hypothyroidism: Continue Synthroid  Gout: Continue allopurinol  Debility/deconditioning: Mobilize with PT/OT-May need home health services on discharge.  Pressure Ulcer: Pressure Injury 05/04/21 Other (Comment) Right Stage 3 -  Full thickness tissue loss. Subcutaneous fat may be visible but bone, tendon or muscle are NOT exposed. 0.5x0.5, 1 inch depth (Active)  05/04/21 0630  Location: Other (Comment)  Location Orientation: Right  Staging: Stage 3 -  Full thickness tissue loss. Subcutaneous  fat may be visible but bone, tendon or muscle are NOT exposed.  Wound Description (Comments): 0.5x0.5, 1 inch depth  Present on Admission: Yes  Dressing Type None 04/19/22 0800    Obesity: Estimated body mass index is 38.75 kg/m as calculated from the following:   Height as of this encounter: 5\' 4"  (1.626 m).   Weight as of this encounter: 102.4 kg.   Code status:    Code Status: Full Code   DVT Prophylaxis: apixaban (ELIQUIS) tablet 5 mg    Family Communication: None at bedside  Disposition Plan: Status is: Inpatient Remains inpatient appropriate because: Resolving sepsis physiology-right leg pain today-awaiting Dopplers-ambulate with rehab services-likely home in the next 1-2 days.   Planned Discharge Destination:Home   Diet: Diet Order             Diet heart healthy/carb modified Room service appropriate? Yes; Fluid consistency: Thin  Diet effective now                     Antimicrobial agents: Anti-infectives (From admission, onward)    None        MEDICATIONS: Scheduled Meds:  allopurinol  100 mg Oral Daily   amiodarone  100 mg Oral q morning   apixaban  5 mg Oral BID   atorvastatin  40 mg Oral Daily   buPROPion  150 mg Oral BID   FLUoxetine  20 mg Oral QHS   fluticasone furoate-vilanterol  1 puff Inhalation Daily   insulin aspart  0-15 Units Subcutaneous TID WC   insulin aspart  0-5 Units Subcutaneous QHS   insulin glargine-yfgn  15 Units Subcutaneous QHS   levothyroxine  50 mcg Oral Q0600   Continuous Infusions:  sodium chloride Stopped (04/18/22 1855)   PRN Meds:.sodium chloride, acetaminophen   I have personally reviewed following labs and imaging studies  LABORATORY DATA: CBC: Recent Labs  Lab 04/17/22 2338 04/18/22 1140 04/19/22 0207 04/20/22 0134  WBC 30.0* 26.2* 28.0* 17.0*  NEUTROABS  --  24.1*  --   --   HGB 13.0 12.3 12.2 12.2  HCT 40.1 38.6 39.1 36.5  MCV 94.8 96.7 97.5 92.9  PLT 245 217 203 626    Basic Metabolic Panel: Recent Labs  Lab 04/17/22 2338 04/18/22 1140 04/19/22 0207 04/20/22 0134  NA 136 135 133* 130*  K 2.7* 3.6 4.5 4.4  CL 98 100 98 95*  CO2 26 24 25 23   GLUCOSE 153* 226* 202* 195*  BUN 21* 20 24* 40*  CREATININE 1.43* 1.34* 1.52* 1.90*  CALCIUM 9.0 8.7* 9.0 9.0  MG 1.4* 2.0 2.4 2.6*  PHOS  --  2.7  --   --     GFR: Estimated Creatinine Clearance: 37.1  mL/min (A) (by C-G formula based on SCr of 1.9 mg/dL (H)).  Liver Function Tests: Recent Labs  Lab 04/17/22 2338 04/18/22 1140 04/19/22 0207 04/20/22 0134  AST 46* 46* 41 28  ALT 53* 53* 51* 42  ALKPHOS 107 116 120 128*  BILITOT 0.5 0.6 0.8 0.5  PROT 6.7 6.2* 6.2* 6.4*  ALBUMIN 3.4* 3.0* 2.6* 2.8*   Recent Labs  Lab 04/17/22 2338  LIPASE 28   No results for input(s): AMMONIA in the last 168 hours.  Coagulation Profile: No results for input(s): INR, PROTIME in the last 168 hours.  Cardiac Enzymes: No results for input(s): CKTOTAL, CKMB, CKMBINDEX, TROPONINI in the last 168 hours.  BNP (last 3 results) No results for input(s): PROBNP in the last 8760  hours.  Lipid Profile: No results for input(s): CHOL, HDL, LDLCALC, TRIG, CHOLHDL, LDLDIRECT in the last 72 hours.  Thyroid Function Tests: No results for input(s): TSH, T4TOTAL, FREET4, T3FREE, THYROIDAB in the last 72 hours.  Anemia Panel: No results for input(s): VITAMINB12, FOLATE, FERRITIN, TIBC, IRON, RETICCTPCT in the last 72 hours.  Urine analysis:    Component Value Date/Time   COLORURINE YELLOW 08/10/2021 2027   APPEARANCEUR CLEAR 08/10/2021 2027   LABSPEC 1.011 08/10/2021 2027   PHURINE 6.0 08/10/2021 2027   GLUCOSEU >=500 (A) 08/10/2021 2027   HGBUR NEGATIVE 08/10/2021 2027   BILIRUBINUR NEGATIVE 08/10/2021 2027   KETONESUR NEGATIVE 08/10/2021 2027   PROTEINUR NEGATIVE 08/10/2021 2027   UROBILINOGEN 0.2 09/05/2015 0012   NITRITE NEGATIVE 08/10/2021 2027   LEUKOCYTESUR NEGATIVE 08/10/2021 2027    Sepsis Labs: Lactic Acid, Venous    Component Value Date/Time   LATICACIDVEN 1.0 10/19/2021 1614    MICROBIOLOGY: Recent Results (from the past 240 hour(s))  SARS Coronavirus 2 by RT PCR (hospital order, performed in [facility] hospital lab) *cepheid single result test* Anterior Nasal Swab     Status: None   Collection Time: 04/18/22  1:47 AM   Specimen: Anterior Nasal Swab  Result Value Ref Range  Status   SARS Coronavirus 2 by RT PCR NEGATIVE NEGATIVE Final    Comment: (NOTE) SARS-CoV-2 target nucleic acids are NOT DETECTED.  The SARS-CoV-2 RNA is generally detectable in upper and lower respiratory specimens during the acute phase of infection. The lowest concentration of SARS-CoV-2 viral copies this assay can detect is 250 copies / mL. A negative result does not preclude SARS-CoV-2 infection and should not be used as the sole basis for treatment or other patient management decisions.  A negative result may occur with improper specimen collection / handling, submission of specimen other than nasopharyngeal swab, presence of viral mutation(s) within the areas targeted by this assay, and inadequate number of viral copies (<250 copies / mL). A negative result must be combined with clinical observations, patient history, and epidemiological information.  Fact Sheet for Patients:   https://www.patel.info/  Fact Sheet for Healthcare Providers: https://hall.com/  This test is not yet approved or  cleared by the [place] FDA and has been authorized for detection and/or diagnosis of SARS-CoV-2 by FDA under an Emergency Use Authorization (EUA).  This EUA will remain in effect (meaning this test can be used) for the duration of the COVID-19 declaration under Section 564(b)(1) of the Act, 21 U.S.C. section 360bbb-3(b)(1), unless the authorization is terminated or revoked sooner.  Performed at [facility], [place] [address], [place], [place] [address]   C Difficile Quick Screen w PCR reflex     Status: None   Collection Time: 04/18/22  1:47 AM   Specimen: Stool  Result Value Ref Range Status   C Diff antigen NEGATIVE NEGATIVE Final   C Diff toxin NEGATIVE NEGATIVE Final   C Diff interpretation No C. difficile detected.  Final    Comment: Performed at [facility], [place] [address] [place], [place] [address]  Gastrointestinal  Panel by PCR , Stool     Status: None   Collection Time: 04/18/22  3:23 AM   Specimen: Stool  Result Value Ref Range Status   Campylobacter species NOT DETECTED NOT DETECTED Final   Plesimonas shigelloides NOT DETECTED NOT DETECTED Final   Salmonella species NOT DETECTED NOT DETECTED Final   Yersinia enterocolitica NOT DETECTED NOT DETECTED Final  Vibrio species NOT DETECTED NOT DETECTED Final   Vibrio cholerae NOT DETECTED NOT DETECTED Final   Enteroaggregative E coli (EAEC) NOT DETECTED NOT DETECTED Final   Enteropathogenic E coli (EPEC) NOT DETECTED NOT DETECTED Final   Enterotoxigenic E coli (ETEC) NOT DETECTED NOT DETECTED Final   Shiga like toxin producing E coli (STEC) NOT DETECTED NOT DETECTED Final   Shigella/Enteroinvasive E coli (EIEC) NOT DETECTED NOT DETECTED Final   Cryptosporidium NOT DETECTED NOT DETECTED Final   Cyclospora cayetanensis NOT DETECTED NOT DETECTED Final   Entamoeba histolytica NOT DETECTED NOT DETECTED Final   Giardia lamblia NOT DETECTED NOT DETECTED Final   Adenovirus F40/41 NOT DETECTED NOT DETECTED Final   Astrovirus NOT DETECTED NOT DETECTED Final   Norovirus GI/GII NOT DETECTED NOT DETECTED Final   Rotavirus A NOT DETECTED NOT DETECTED Final   Sapovirus (I, II, IV, and V) NOT DETECTED NOT DETECTED Final    Comment: Performed at [facility], [place], [place] [address]  Respiratory (~20 pathogens) panel by PCR     Status: None   Collection Time: 04/18/22 11:29 AM   Specimen: Nasopharyngeal Swab; Respiratory  Result Value Ref Range Status   Adenovirus NOT DETECTED NOT DETECTED Final   Coronavirus 229E NOT DETECTED NOT DETECTED Final    Comment: (NOTE) The Coronavirus on the Respiratory Panel, DOES NOT test for the novel  Coronavirus (2019 nCoV)    Coronavirus HKU1 NOT DETECTED NOT DETECTED Final   Coronavirus NL63 NOT DETECTED NOT DETECTED Final   Coronavirus OC43 NOT DETECTED NOT DETECTED Final   Metapneumovirus NOT  DETECTED NOT DETECTED Final   Rhinovirus / Enterovirus NOT DETECTED NOT DETECTED Final   Influenza A NOT DETECTED NOT DETECTED Final   Influenza B NOT DETECTED NOT DETECTED Final   Parainfluenza Virus 1 NOT DETECTED NOT DETECTED Final   Parainfluenza Virus 2 NOT DETECTED NOT DETECTED Final   Parainfluenza Virus 3 NOT DETECTED NOT DETECTED Final   Parainfluenza Virus 4 NOT DETECTED NOT DETECTED Final   Respiratory Syncytial Virus NOT DETECTED NOT DETECTED Final   Bordetella pertussis NOT DETECTED NOT DETECTED Final   Bordetella Parapertussis NOT DETECTED NOT DETECTED Final   Chlamydophila pneumoniae NOT DETECTED NOT DETECTED Final   Mycoplasma pneumoniae NOT DETECTED NOT DETECTED Final    Comment: Performed at [facility], [place] [address] [place], [place] [address]    RADIOLOGY STUDIES/RESULTS: No results found.   LOS: 2 days   Oren Binet, MD  [facility]    To contact the attending provider between 7A-7P or the covering provider during after hours 7P-7A, please log into the web site [email or web address] and access using universal [place] password for that web site. If you do not have the password, please call the hospital operator.  04/20/2022, 12:17 PM

## 2022-04-20 NOTE — Progress Notes (Signed)
Inpatient Diabetes Program Recommendations  AACE/ADA: New Consensus Statement on Inpatient Glycemic Control (2015)  Target Ranges:  Prepandial:   less than 140 mg/dL      Peak postprandial:   less than 180 mg/dL (1-2 hours)      Critically ill patients:  140 - 180 mg/dL   Lab Results  Component Value Date   GLUCAP 355 (H) 04/20/2022   HGBA1C 10.3 (H) 04/18/2022    Review of Glycemic Control Diabetes history: DM 2 Outpatient Diabetes medications:  Trulicity 8.33 mg q Friday Lantus 38 units q HS Humalog 8 units tid with meals Jardiance 10 mg daily Current orders for Inpatient glycemic control:  Novolog moderate tid with meals and HS Semglee 15 units QHS   Inpatient Diabetes Program Recommendations:     Consider adding Novolog 4 units TID (assuming patient is consuming >50% of meals) and increasing Semglee to 25 units q HS.  Thanks, Bronson Curb, MSN, RNC-OB Diabetes Coordinator [phone] (8a-5p)

## 2022-04-21 ENCOUNTER — Inpatient Hospital Stay (HOSPITAL_COMMUNITY): Payer: Medicare Other

## 2022-04-21 DIAGNOSIS — M7989 Other specified soft tissue disorders: Secondary | ICD-10-CM

## 2022-04-21 DIAGNOSIS — I5042 Chronic combined systolic (congestive) and diastolic (congestive) heart failure: Secondary | ICD-10-CM | POA: Diagnosis not present

## 2022-04-21 DIAGNOSIS — I2510 Atherosclerotic heart disease of native coronary artery without angina pectoris: Secondary | ICD-10-CM

## 2022-04-21 DIAGNOSIS — E038 Other specified hypothyroidism: Secondary | ICD-10-CM | POA: Diagnosis not present

## 2022-04-21 DIAGNOSIS — A084 Viral intestinal infection, unspecified: Secondary | ICD-10-CM | POA: Diagnosis not present

## 2022-04-21 LAB — COMPREHENSIVE METABOLIC PANEL
ALT: 28 U/L (ref 0–44)
AST: 15 U/L (ref 15–41)
Albumin: 2.4 g/dL — ABNORMAL LOW (ref 3.5–5.0)
Alkaline Phosphatase: 129 U/L — ABNORMAL HIGH (ref 38–126)
Anion gap: 7 (ref 5–15)
BUN: 43 mg/dL — ABNORMAL HIGH (ref 6–20)
CO2: 27 mmol/L (ref 22–32)
Calcium: 8.7 mg/dL — ABNORMAL LOW (ref 8.9–10.3)
Chloride: 98 mmol/L (ref 98–111)
Creatinine, Ser: 1.59 mg/dL — ABNORMAL HIGH (ref 0.44–1.00)
GFR, Estimated: 37 mL/min — ABNORMAL LOW (ref >60)
Glucose, Bld: 173 mg/dL — ABNORMAL HIGH (ref 70–99)
Potassium: 4 mmol/L (ref 3.5–5.1)
Sodium: 132 mmol/L — ABNORMAL LOW (ref 135–145)
Total Bilirubin: 0.6 mg/dL (ref 0.3–1.2)
Total Protein: 6 g/dL — ABNORMAL LOW (ref 6.5–8.1)

## 2022-04-21 LAB — CBC
HCT: 33.8 % — ABNORMAL LOW (ref 36.0–46.0)
Hemoglobin: 11 g/dL — ABNORMAL LOW (ref 12.0–15.0)
MCH: 30.7 pg (ref 26.0–34.0)
MCHC: 32.5 g/dL (ref 30.0–36.0)
MCV: 94.4 fL (ref 80.0–100.0)
Platelets: 216 10*3/uL (ref 150–400)
RBC: 3.58 MIL/uL — ABNORMAL LOW (ref 3.87–5.11)
RDW: 13.3 % (ref 11.5–15.5)
WBC: 8.6 10*3/uL (ref 4.0–10.5)
nRBC: 0 % (ref 0.0–0.2)

## 2022-04-21 LAB — GLUCOSE, CAPILLARY
Glucose-Capillary: 201 mg/dL — ABNORMAL HIGH (ref 70–99)
Glucose-Capillary: 363 mg/dL — ABNORMAL HIGH (ref 70–99)
Glucose-Capillary: 565 mg/dL (ref 70–99)
Glucose-Capillary: 299 mg/dL — ABNORMAL HIGH (ref 70–99)
Glucose-Capillary: 245 mg/dL — ABNORMAL HIGH (ref 70–99)

## 2022-04-21 LAB — MAGNESIUM: Magnesium: 2.4 mg/dL (ref 1.7–2.4)

## 2022-04-21 MED ORDER — LIDOCAINE 5 % EX PTCH
1.0000 | MEDICATED_PATCH | CUTANEOUS | Status: DC
  Administered 2022-04-21: 1 via TRANSDERMAL
  Filled 2022-04-21: qty 1

## 2022-04-21 MED ORDER — DIPHENHYDRAMINE HCL 25 MG PO CAPS
25.0000 mg | ORAL_CAPSULE | Freq: Four times a day (QID) | ORAL | Status: DC | PRN
Start: 2022-04-21 — End: 2022-04-22
  Administered 2022-04-21: 25 mg via ORAL
  Filled 2022-04-21: qty 1

## 2022-04-21 MED ORDER — SPIRONOLACTONE 12.5 MG HALF TABLET
12.5000 mg | ORAL_TABLET | Freq: Every day | ORAL | Status: DC
  Administered 2022-04-21 – 2022-04-22 (×2): 12.5 mg via ORAL
  Filled 2022-04-21 (×2): qty 1

## 2022-04-21 MED ORDER — TORSEMIDE 20 MG PO TABS
60.0000 mg | ORAL_TABLET | Freq: Two times a day (BID) | ORAL | Status: DC
  Administered 2022-04-21 – 2022-04-22 (×2): 60 mg via ORAL
  Filled 2022-04-21 (×2): qty 3

## 2022-04-21 MED ORDER — EMPAGLIFLOZIN 10 MG PO TABS
10.0000 mg | ORAL_TABLET | Freq: Every day | ORAL | Status: DC
  Administered 2022-04-21 – 2022-04-22 (×2): 10 mg via ORAL
  Filled 2022-04-21 (×2): qty 1

## 2022-04-21 NOTE — Progress Notes (Signed)
PROGRESS NOTE        PATIENT DETAILS Name: [name] Age: 61 y.o. Sex: female Date of Birth: 1961/02/05 Admit Date: 04/17/2022 Admitting Physician Kayleen Memos, DO MAU:[id number], Charlane Ferretti, MD  Brief Summary: 61 year old female with history of HFpEF, HLD who presented with nausea, vomiting and diarrhea x4 days-subsequently admitted to the hospitalist service for further evaluation and treatment.   Significant events: 6/4>> nausea/vomiting/diarrhea-admit to [facility]  Significant studies: 6/5>> CT abdomen/pelvis: No acute process 6/5>> CXR: No PNA  Significant microbiology data: 6/5>> respiratory virus panel: Negative 6/5>> C. difficile PCR: Negative 6/5>> GI pathogen panel: Negative  Procedures: None  Consults: None   Subjective: Right leg swelling is better.  No diarrhea-in fact had a regular BM earlier today.   Objective: Vitals: Blood pressure (!) 147/72, pulse 76, temperature 97.7 F (36.5 C), temperature source Oral, resp. rate 16, height 5\' 4"  (1.626 m), weight 104.2 kg, SpO2 94 %.   Exam: Gen Exam:Alert awake-not in any distress HEENT:atraumatic, normocephalic Chest: B/L clear to auscultation anteriorly CVS:S1S2 regular Abdomen:soft non tender, non distended Extremities: Right TMA-decrease right leg erythema/swelling today. Neurology: Non focal Skin: no rash   Pertinent Labs/Radiology:    Latest Ref Rng & Units 04/21/2022    3:59 AM 04/20/2022    1:34 AM 04/19/2022    2:07 AM  CBC  WBC 4.0 - 10.5 K/uL 8.6  17.0  28.0   Hemoglobin 12.0 - 15.0 g/dL 11.0  12.2  12.2   Hematocrit 36.0 - 46.0 % 33.8  36.5  39.1   Platelets 150 - 400 K/uL 216  174  203     Lab Results  Component Value Date   NA 132 (L) 04/21/2022   K 4.0 04/21/2022   CL 98 04/21/2022   CO2 27 04/21/2022      Assessment/Plan: Sepsis due to viral gastroenteritis: Sepsis physiology has resolved-GI symptoms rapidly improving.  Cytosis has resolved-stool work-up  negative.   Hypokalemia: Due to GI loss-repleted.  Hyponatremia: Mild-watch closely.  HFpEF: Reasonably well compensated-tolerating initiation of diuretics well-we will resume her usual regimen.  PAF: Continue amiodarone and Eliquis-continue telemetry monitoring.  History of junctional bradycardia: No longer on Cardizem/beta-blocker.  Continue telemetry monitoring  HTN: BP stable-continue amlodipine.  HLD: Continue statin  PAD-s/p right TMA  History of carotid stenosis-s/p left CEA  Right leg swelling: Erythema/swelling somewhat better-awaiting RLE Doppler.  Already on Eliquis.  CKD stage IIIb: Close to baseline-follow periodically.  DM-2 (A1c 10.3 on 6/5): CBG creeping up-increase Semglee to 25 units daily-continue SSI-follow and optimize.  Recent Labs    04/20/22 1620 04/20/22 2139 04/21/22 0813  GLUCAP 303* 267* 201*    History of diabetic neuropathy  Hypothyroidism: Continue Synthroid  Gout: Continue allopurinol  Debility/deconditioning: Better with resolution of acute illness-evaluated by PT-no recommendations.  Pressure Ulcer: Pressure Injury 05/04/21 Other (Comment) Right Stage 3 -  Full thickness tissue loss. Subcutaneous fat may be visible but bone, tendon or muscle are NOT exposed. 0.5x0.5, 1 inch depth (Active)  05/04/21 0630  Location: Other (Comment)  Location Orientation: Right  Staging: Stage 3 -  Full thickness tissue loss. Subcutaneous fat may be visible but bone, tendon or muscle are NOT exposed.  Wound Description (Comments): 0.5x0.5, 1 inch depth  Present on Admission: Yes  Dressing Type None 04/19/22 0800    Obesity: Estimated body mass index is  39.43 kg/m as calculated from the following:   Height as of this encounter: 5\' 4"  (1.626 m).   Weight as of this encounter: 104.2 kg.   Code status:   Code Status: Full Code   DVT Prophylaxis: apixaban (ELIQUIS) tablet 5 mg    Family Communication: None at bedside  Disposition  Plan: Status is: Inpatient Remains inpatient appropriate because: Resolving sepsis physiology-awaiting Doppler-before consideration of discharge.   Planned Discharge Destination:Home   Diet: Diet Order             Diet heart healthy/carb modified Room service appropriate? Yes; Fluid consistency: Thin  Diet effective now                     Antimicrobial agents: Anti-infectives (From admission, onward)    None        MEDICATIONS: Scheduled Meds:  allopurinol  100 mg Oral Daily   amiodarone  100 mg Oral q morning   amLODipine  5 mg Oral Daily   apixaban  5 mg Oral BID   atorvastatin  40 mg Oral Daily   buPROPion  150 mg Oral BID   empagliflozin  10 mg Oral Daily   FLUoxetine  20 mg Oral QHS   fluticasone furoate-vilanterol  1 puff Inhalation Daily   insulin aspart  0-15 Units Subcutaneous TID WC   insulin aspart  0-5 Units Subcutaneous QHS   insulin glargine-yfgn  25 Units Subcutaneous QHS   levothyroxine  50 mcg Oral Q0600   melatonin  3 mg Oral QHS   torsemide  40 mg Oral Daily   Continuous Infusions:  sodium chloride Stopped (04/18/22 1855)   PRN Meds:.sodium chloride, acetaminophen, albuterol   I have personally reviewed following labs and imaging studies  LABORATORY DATA: CBC: Recent Labs  Lab 04/17/22 2338 04/18/22 1140 04/19/22 0207 04/20/22 0134 04/21/22 0359  WBC 30.0* 26.2* 28.0* 17.0* 8.6  NEUTROABS  --  24.1*  --   --   --   HGB 13.0 12.3 12.2 12.2 11.0*  HCT 40.1 38.6 39.1 36.5 33.8*  MCV 94.8 96.7 97.5 92.9 94.4  PLT 245 217 203 174 563    Basic Metabolic Panel: Recent Labs  Lab 04/17/22 2338 04/18/22 1140 04/19/22 0207 04/20/22 0134 04/21/22 0359  NA 136 135 133* 130* 132*  K 2.7* 3.6 4.5 4.4 4.0  CL 98 100 98 95* 98  CO2 26 24 25 23 27   GLUCOSE 153* 226* 202* 195* 173*  BUN 21* 20 24* 40* 43*  CREATININE 1.43* 1.34* 1.52* 1.90* 1.59*  CALCIUM 9.0 8.7* 9.0 9.0 8.7*  MG 1.4* 2.0 2.4 2.6* 2.4  PHOS  --  2.7  --   --    --     GFR: Estimated Creatinine Clearance: 44.8 mL/min (A) (by C-G formula based on SCr of 1.59 mg/dL (H)).  Liver Function Tests: Recent Labs  Lab 04/17/22 2338 04/18/22 1140 04/19/22 0207 04/20/22 0134 04/21/22 0359  AST 46* 46* 41 28 15  ALT 53* 53* 51* 42 28  ALKPHOS 107 116 120 128* 129*  BILITOT 0.5 0.6 0.8 0.5 0.6  PROT 6.7 6.2* 6.2* 6.4* 6.0*  ALBUMIN 3.4* 3.0* 2.6* 2.8* 2.4*   Recent Labs  Lab 04/17/22 2338  LIPASE 28   No results for input(s): "AMMONIA" in the last 168 hours.  Coagulation Profile: No results for input(s): "INR", "PROTIME" in the last 168 hours.  Cardiac Enzymes: No results for input(s): "CKTOTAL", "CKMB", "CKMBINDEX", "TROPONINI" in the last  168 hours.  BNP (last 3 results) No results for input(s): "PROBNP" in the last 8760 hours.  Lipid Profile: No results for input(s): "CHOL", "HDL", "LDLCALC", "TRIG", "CHOLHDL", "LDLDIRECT" in the last 72 hours.  Thyroid Function Tests: No results for input(s): "TSH", "T4TOTAL", "FREET4", "T3FREE", "THYROIDAB" in the last 72 hours.  Anemia Panel: No results for input(s): "VITAMINB12", "FOLATE", "FERRITIN", "TIBC", "IRON", "RETICCTPCT" in the last 72 hours.  Urine analysis:    Component Value Date/Time   COLORURINE YELLOW 08/10/2021 2027   APPEARANCEUR CLEAR 08/10/2021 2027   LABSPEC 1.011 08/10/2021 2027   PHURINE 6.0 08/10/2021 2027   GLUCOSEU >=500 (A) 08/10/2021 2027   HGBUR NEGATIVE 08/10/2021 2027   BILIRUBINUR NEGATIVE 08/10/2021 2027   KETONESUR NEGATIVE 08/10/2021 2027   PROTEINUR NEGATIVE 08/10/2021 2027   UROBILINOGEN 0.2 09/05/2015 0012   NITRITE NEGATIVE 08/10/2021 2027   LEUKOCYTESUR NEGATIVE 08/10/2021 2027    Sepsis Labs: Lactic Acid, Venous    Component Value Date/Time   LATICACIDVEN 1.0 10/19/2021 1614    MICROBIOLOGY: Recent Results (from the past 240 hour(s))  SARS Coronavirus 2 by RT PCR (hospital order, performed in [facility] hospital lab) *cepheid single  result test* Anterior Nasal Swab     Status: None   Collection Time: 04/18/22  1:47 AM   Specimen: Anterior Nasal Swab  Result Value Ref Range Status   SARS Coronavirus 2 by RT PCR NEGATIVE NEGATIVE Final    Comment: (NOTE) SARS-CoV-2 target nucleic acids are NOT DETECTED.  The SARS-CoV-2 RNA is generally detectable in upper and lower respiratory specimens during the acute phase of infection. The lowest concentration of SARS-CoV-2 viral copies this assay can detect is 250 copies / mL. A negative result does not preclude SARS-CoV-2 infection and should not be used as the sole basis for treatment or other patient management decisions.  A negative result may occur with improper specimen collection / handling, submission of specimen other than nasopharyngeal swab, presence of viral mutation(s) within the areas targeted by this assay, and inadequate number of viral copies (<250 copies / mL). A negative result must be combined with clinical observations, patient history, and epidemiological information.  Fact Sheet for Patients:   https://www.patel.info/  Fact Sheet for Healthcare Providers: https://hall.com/  This test is not yet approved or  cleared by the [place] FDA and has been authorized for detection and/or diagnosis of SARS-CoV-2 by FDA under an Emergency Use Authorization (EUA).  This EUA will remain in effect (meaning this test can be used) for the duration of the COVID-19 declaration under Section 564(b)(1) of the Act, 21 U.S.C. section 360bbb-3(b)(1), unless the authorization is terminated or revoked sooner.  Performed at [facility], [place] [address] [place], [place] [address]   C Difficile Quick Screen w PCR reflex     Status: None   Collection Time: 04/18/22  1:47 AM   Specimen: Stool  Result Value Ref Range Status   C Diff antigen NEGATIVE NEGATIVE Final   C Diff toxin NEGATIVE NEGATIVE Final   C Diff  interpretation No C. difficile detected.  Final    Comment: Performed at [facility], [place] [address], [place], [place] [address]  Gastrointestinal Panel by PCR , Stool     Status: None   Collection Time: 04/18/22  3:23 AM   Specimen: Stool  Result Value Ref Range Status   Campylobacter species NOT DETECTED NOT DETECTED Final   Plesimonas shigelloides NOT DETECTED NOT DETECTED Final   Salmonella species  NOT DETECTED NOT DETECTED Final   Yersinia enterocolitica NOT DETECTED NOT DETECTED Final   Vibrio species NOT DETECTED NOT DETECTED Final   Vibrio cholerae NOT DETECTED NOT DETECTED Final   Enteroaggregative E coli (EAEC) NOT DETECTED NOT DETECTED Final   Enteropathogenic E coli (EPEC) NOT DETECTED NOT DETECTED Final   Enterotoxigenic E coli (ETEC) NOT DETECTED NOT DETECTED Final   Shiga like toxin producing E coli (STEC) NOT DETECTED NOT DETECTED Final   Shigella/Enteroinvasive E coli (EIEC) NOT DETECTED NOT DETECTED Final   Cryptosporidium NOT DETECTED NOT DETECTED Final   Cyclospora cayetanensis NOT DETECTED NOT DETECTED Final   Entamoeba histolytica NOT DETECTED NOT DETECTED Final   Giardia lamblia NOT DETECTED NOT DETECTED Final   Adenovirus F40/41 NOT DETECTED NOT DETECTED Final   Astrovirus NOT DETECTED NOT DETECTED Final   Norovirus GI/GII NOT DETECTED NOT DETECTED Final   Rotavirus A NOT DETECTED NOT DETECTED Final   Sapovirus (I, II, IV, and V) NOT DETECTED NOT DETECTED Final    Comment: Performed at [facility], [place], [place] [address]  Respiratory (~20 pathogens) panel by PCR     Status: None   Collection Time: 04/18/22 11:29 AM   Specimen: Nasopharyngeal Swab; Respiratory  Result Value Ref Range Status   Adenovirus NOT DETECTED NOT DETECTED Final   Coronavirus 229E NOT DETECTED NOT DETECTED Final    Comment: (NOTE) The Coronavirus on the Respiratory Panel, DOES NOT test for the novel  Coronavirus (2019 nCoV)    Coronavirus HKU1 NOT  DETECTED NOT DETECTED Final   Coronavirus NL63 NOT DETECTED NOT DETECTED Final   Coronavirus OC43 NOT DETECTED NOT DETECTED Final   Metapneumovirus NOT DETECTED NOT DETECTED Final   Rhinovirus / Enterovirus NOT DETECTED NOT DETECTED Final   Influenza A NOT DETECTED NOT DETECTED Final   Influenza B NOT DETECTED NOT DETECTED Final   Parainfluenza Virus 1 NOT DETECTED NOT DETECTED Final   Parainfluenza Virus 2 NOT DETECTED NOT DETECTED Final   Parainfluenza Virus 3 NOT DETECTED NOT DETECTED Final   Parainfluenza Virus 4 NOT DETECTED NOT DETECTED Final   Respiratory Syncytial Virus NOT DETECTED NOT DETECTED Final   Bordetella pertussis NOT DETECTED NOT DETECTED Final   Bordetella Parapertussis NOT DETECTED NOT DETECTED Final   Chlamydophila pneumoniae NOT DETECTED NOT DETECTED Final   Mycoplasma pneumoniae NOT DETECTED NOT DETECTED Final    Comment: Performed at [facility], [place]. [address] [place], [place] [address]    RADIOLOGY STUDIES/RESULTS: No results found.   LOS: 3 days   Oren Binet, MD  [facility]    To contact the attending provider between 7A-7P or the covering provider during after hours 7P-7A, please log into the web site [email or web address] and access using universal [facility] password for that web site. If you do not have the password, please call the hospital operator.  04/21/2022, 11:16 AM

## 2022-04-21 NOTE — Evaluation (Signed)
Physical Therapy Evaluation Patient Details Name: [name] MRN: [id number] DOB: 1960-12-12 Today's Date: 04/21/2022  History of Present Illness  Pt adm 6/5 with N/V/D which is due to suspected viral enteritis. PMH - chf, PAD, HTN, polysubstance abuse, obesity, arthritis, CAD, CKD, DM, peripheral neuropathy, copd, rt transmetatarsal amputation, gout  Clinical Impression  Pt doing well with mobility and no further PT needed.  Ready for dc from PT standpoint.        Recommendations for follow up therapy are one component of a multi-disciplinary discharge planning process, led by the attending physician.  Recommendations may be updated based on patient status, additional functional criteria and insurance authorization.  Follow Up Recommendations No PT follow up    Assistance Recommended at Discharge PRN  Patient can return home with the following       Equipment Recommendations None recommended by PT  Recommendations for Other Services       Functional Status Assessment Patient has not had a recent decline in their functional status     Precautions / Restrictions Precautions Precautions: Fall      Mobility  Bed Mobility               General bed mobility comments: Pt up in chair    Transfers Overall transfer level: Modified independent Equipment used: Rollator (4 wheels)                    Ambulation/Gait Ambulation/Gait assistance: Modified independent (Device/Increase time) Gait Distance (Feet): 150 Feet Assistive device: Rollator (4 wheels) Gait Pattern/deviations: Step-through pattern, Decreased stride length, Trunk flexed Gait velocity: decr Gait velocity interpretation: 1.31 - 2.62 ft/sec, indicative of limited community ambulator   General Gait Details: Steady gait with rollator  Stairs            Wheelchair Mobility    Modified Rankin (Stroke Patients Only)       Balance Overall balance assessment: Needs assistance, History  of Falls Sitting-balance support: No upper extremity supported, Feet supported Sitting balance-Leahy Scale: Normal     Standing balance support: No upper extremity supported, During functional activity Standing balance-Leahy Scale: Fair                               Pertinent Vitals/Pain Pain Assessment Pain Assessment: 0-10 Pain Score: 10-Worst pain ever Pain Location: bil feet with mobility Pain Descriptors / Indicators: Pins and needles Pain Intervention(s): Limited activity within patient's tolerance, Repositioned    Home Living Family/patient expects to be discharged to:: Private residence Living Arrangements: Other relatives (sister, nieces) Available Help at Discharge: Family;Available 24 hours/day Type of Home: Apartment Home Access: Level entry     Alternate Level Stairs-Number of Steps: Pt reports that she sleeps in a hospital bed on the main floor but there is no bathroom.  Uses BSC unless going upstairs for shower.Pt must go up 8 steps total with a landing in the middle and a unilateral rail. Home Layout: Two level Home Equipment: Conservation officer, nature (2 wheels);Rollator (4 wheels);Cane - quad;Grab bars - toilet;Hospital bed;Wheelchair - power;Shower seat;BSC/3in1      Prior Function Prior Level of Function : Independent/Modified Independent             Mobility Comments: Uses quad cane in home and rollator or w/c when out       Hand Dominance   Dominant Hand: Left    Extremity/Trunk Assessment   Upper Extremity Assessment  Upper Extremity Assessment: Defer to OT evaluation    Lower Extremity Assessment Lower Extremity Assessment: Overall WFL for tasks assessed       Communication   Communication: No difficulties  Cognition Arousal/Alertness: Awake/alert Behavior During Therapy: WFL for tasks assessed/performed Overall Cognitive Status: Within Functional Limits for tasks assessed                                           General Comments      Exercises     Assessment/Plan    PT Assessment Patient does not need any further PT services  PT Problem List         PT Treatment Interventions      PT Goals (Current goals can be found in the Care Plan section)  Acute Rehab PT Goals Patient Stated Goal: return home PT Goal Formulation: All assessment and education complete, DC therapy    Frequency       Co-evaluation               AM-PAC PT "6 Clicks" Mobility  Outcome Measure Help needed turning from your back to your side while in a flat bed without using bedrails?: None Help needed moving from lying on your back to sitting on the side of a flat bed without using bedrails?: None Help needed moving to and from a bed to a chair (including a wheelchair)?: None Help needed standing up from a chair using your arms (e.g., wheelchair or bedside chair)?: None Help needed to walk in hospital room?: None Help needed climbing 3-5 steps with a railing? : A Little 6 Click Score: 23    End of Session   Activity Tolerance: Patient tolerated treatment well Patient left: in chair;with call bell/phone within reach Nurse Communication: Mobility status PT Visit Diagnosis: Other abnormalities of gait and mobility (R26.89)    Time: 0349-1791 PT Time Calculation (min) (ACUTE ONLY): 21 min   Charges:   PT Evaluation $PT Eval Low Complexity: 1 Low          [place] Office [facility] 04/21/2022, 9:58 AM

## 2022-04-21 NOTE — Progress Notes (Signed)
Blood sugar = 565 rechecked immediately to confirm = 363 Insuline given per sliding scale

## 2022-04-21 NOTE — Progress Notes (Signed)
Right lower extremity venous duplex has been completed. Preliminary results can be found in CV Proc through chart review.   04/21/22 11:26 AM [name] RVT

## 2022-04-21 NOTE — Evaluation (Signed)
Occupational Therapy Evaluation and Discharge Patient Details Name: [name] MRN: [id number] DOB: [birth year] Today's Date: 04/21/2022   History of Present Illness Pt adm 6/5 with N/V/D which is due to suspected viral enteritis. PMH - chf, PAD, HTN, polysubstance abuse, obesity, arthritis, CAD, CKD, DM, peripheral neuropathy, copd, rt transmetatarsal amputation, gout   Clinical Impression   Pt is functioning primarily at a set up to supervision level. Educated in [place] for LB ADLs. Pt is well equipped at home and has 24 hour supervision of her sister at home. No further OT needs.      Recommendations for follow up therapy are one component of a multi-disciplinary discharge planning process, led by the attending physician.  Recommendations may be updated based on patient status, additional functional criteria and insurance authorization.   Follow Up Recommendations  No OT follow up    Assistance Recommended at Discharge PRN  Patient can return home with the following A little help with bathing/dressing/bathroom;Assistance with cooking/housework;Direct supervision/assist for financial management;Assist for transportation;Help with stairs or ramp for entrance    Functional Status Assessment  Patient has had a recent decline in their functional status and demonstrates the ability to make significant improvements in function in a reasonable and predictable amount of time.  Equipment Recommendations  None recommended by OT    Recommendations for Other Services       Precautions / Restrictions Precautions Precautions: Fall      Mobility Bed Mobility               General bed mobility comments: Pt up in chair    Transfers Overall transfer level: Modified independent Equipment used: Rolling walker (2 wheels)                      Balance Overall balance assessment: Needs assistance, History of Falls Sitting-balance support: No upper extremity supported, Feet  supported Sitting balance-Leahy Scale: Normal     Standing balance support: No upper extremity supported, During functional activity Standing balance-Leahy Scale: Fair                             ADL either performed or assessed with clinical judgement   ADL Overall ADL's : Needs assistance/impaired Eating/Feeding: Independent;Sitting   Grooming: Supervision/safety;Standing   Upper Body Bathing: Set up;Sitting   Lower Body Bathing: Minimal assistance;Sit to/from stand Lower Body Bathing Details (indicate cue type and reason): recommended long handled bath sponge and reacher Upper Body Dressing : Set up;Sitting   Lower Body Dressing: Supervision/safety;Sit to/from stand;With adaptive equipment Lower Body Dressing Details (indicate cue type and reason): educated in use of reacher and sock aide, instructed to dress R LE first Toilet Transfer: Supervision/safety;Ambulation;Rolling walker (2 wheels)   Toileting- Clothing Manipulation and Hygiene: Supervision/safety;Sit to/from stand       Functional mobility during ADLs: Supervision/safety;Rolling walker (2 wheels)       Vision Baseline Vision/History: 1 Wears glasses Ability to See in Adequate Light: 0 Adequate Patient Visual Report: No change from baseline       Perception     Praxis      Pertinent Vitals/Pain Pain Assessment Pain Assessment: Faces Pain Location: bil feet with mobility Pain Descriptors / Indicators: Pins and needles Pain Intervention(s): Monitored during session, Repositioned     Hand Dominance Left   Extremity/Trunk Assessment Upper Extremity Assessment Upper Extremity Assessment: Overall WFL for tasks assessed   Lower Extremity Assessment Lower Extremity  Assessment: Defer to PT evaluation   Cervical / Trunk Assessment Cervical / Trunk Assessment: Other exceptions (obesity)   Communication Communication Communication: No difficulties   Cognition Arousal/Alertness:  Awake/alert Behavior During Therapy: WFL for tasks assessed/performed Overall Cognitive Status: Within Functional Limits for tasks assessed                                       General Comments       Exercises     Shoulder Instructions      Home Living Family/patient expects to be discharged to:: Private residence Living Arrangements: Other relatives (sister, nieces) Available Help at Discharge: Family;Available 24 hours/day Type of Home: Apartment Home Access: Level entry     Home Layout: Two level Alternate Level Stairs-Number of Steps: Pt reports that she sleeps in a hospital bed on the main floor but there is no bathroom.  Uses BSC unless going upstairs for shower.Pt must go up 8 steps total with a landing in the middle and a unilateral rail.   Bathroom Shower/Tub: Occupational psychologist: Handicapped height     Home Equipment: Conservation officer, nature (2 wheels);Rollator (4 wheels);Cane - quad;Grab bars - toilet;Hospital bed;Wheelchair - power;Shower seat;BSC/3in1          Prior Functioning/Environment Prior Level of Function : Independent/Modified Independent             Mobility Comments: Uses quad cane in home and rollator or w/c when out ADLs Comments: sits to shower, reliant on sister for IADLs including paying her bills        OT Problem List:        OT Treatment/Interventions:      OT Goals(Current goals can be found in the care plan section)    OT Frequency:      Co-evaluation              AM-PAC OT "6 Clicks" Daily Activity     Outcome Measure Help from another person eating meals?: None Help from another person taking care of personal grooming?: None Help from another person toileting, which includes using toliet, bedpan, or urinal?: None Help from another person bathing (including washing, rinsing, drying)?: A Little Help from another person to put on and taking off regular upper body clothing?: None Help from  another person to put on and taking off regular lower body clothing?: None 6 Click Score: 23   End of Session Equipment Utilized During Treatment: Rolling walker (2 wheels)  Activity Tolerance: Patient tolerated treatment well Patient left: in chair;with call bell/phone within reach  OT Visit Diagnosis: Other abnormalities of gait and mobility (R26.89)                Time: 1245-8099 OT Time Calculation (min): 14 min Charges:  OT General Charges $OT Visit: 1 Visit OT Evaluation $OT Eval Low Complexity: 1 Low Cleta Alberts, OTR/L Acute Rehabilitation Services Office: [phone]  Malka So 04/21/2022, 3:13 PM

## 2022-04-21 NOTE — Progress Notes (Addendum)
   04/21/22 1837  Vitals  Temp 98.2 F (36.8 C)  Temp Source Oral  BP (!) 158/82  MAP (mmHg) 106  BP Location Left Arm  BP Method Automatic  Patient Position (if appropriate) Lying  Pulse Rate Source Monitor  ECG Heart Rate 99  Resp (!) 22  Level of Consciousness  Level of Consciousness Alert  MEWS COLOR  MEWS Score Color Green  Oxygen Therapy  SpO2 95 %  O2 Device Room Air  MEWS Score  MEWS Temp 0  MEWS Systolic 0  MEWS Pulse 0  MEWS RR 1  MEWS LOC 0  MEWS Score 1   Called by CCMD patient off monitor, found patient sitting on the floor and yelling "help". Stated " I fell" assisted to get up. C/o on right shoulder pain from the fall. Denies hitting her head. Assessment done. No bruises  but has a scratch mark upper back. Denies hitting her back.Stated that she was going to get her diaper from the closet. She did not call for assistance. Attempted to call her sister but went to voice mail  MD made aware. X-ray ordered

## 2022-04-22 ENCOUNTER — Ambulatory Visit: Payer: Medicare Other | Admitting: Podiatry

## 2022-04-22 DIAGNOSIS — E1165 Type 2 diabetes mellitus with hyperglycemia: Secondary | ICD-10-CM

## 2022-04-22 LAB — GLUCOSE, CAPILLARY
Glucose-Capillary: 257 mg/dL — ABNORMAL HIGH (ref 70–99)
Glucose-Capillary: 186 mg/dL — ABNORMAL HIGH (ref 70–99)

## 2022-04-22 MED ORDER — INSULIN GLARGINE-YFGN 100 UNIT/ML ~~LOC~~ SOLN
35.0000 [IU] | Freq: Every day | SUBCUTANEOUS | Status: DC
  Filled 2022-04-22: qty 0.35

## 2022-04-22 MED ORDER — INSULIN ASPART 100 UNIT/ML IJ SOLN
8.0000 [IU] | Freq: Three times a day (TID) | INTRAMUSCULAR | Status: DC
  Administered 2022-04-22 (×2): 8 [IU] via SUBCUTANEOUS

## 2022-04-22 NOTE — Discharge Summary (Signed)
PATIENT DETAILS Name: [name] Age: 61 y.o. Sex: female Date of Birth: 1961-07-20 MRN: [id number]. Admitting Physician: Kayleen Memos, DO [id number], Charlane Ferretti, MD  Admit Date: 04/17/2022 Discharge date: 04/22/2022  Recommendations for Outpatient Follow-up:  Follow up with PCP in 1-2 weeks Please obtain CMP/CBC in one week   Admitted From:  Home  Disposition: Home   Discharge Condition: good  CODE STATUS:   Code Status: Full Code   Diet recommendation:  Diet Order             Diet - low sodium heart healthy           Diet Carb Modified           Diet heart healthy/carb modified Room service appropriate? Yes; Fluid consistency: Thin  Diet effective now                    Brief Summary: 61 year old female with history of HFpEF, HLD who presented with nausea, vomiting and diarrhea x4 days-subsequently admitted to the hospitalist service for further evaluation and treatment.     Significant events: 6/4>> nausea/vomiting/diarrhea-admit to [facility]   Significant studies: 6/5>> CT abdomen/pelvis: No acute process 6/5>> CXR: No PNA   Significant microbiology data: 6/5>> respiratory virus panel: Negative 6/5>> C. difficile PCR: Negative 6/5>> GI pathogen panel: Negative 6/8>> RLE Doppler: No DVT   Procedures: None   Consults: None     Brief Hospital Course: Sepsis due to viral gastroenteritis: Sepsis physiology has resolved-nausea/vomiting and diarrhea have completely resolved.  Leukocytosis has resolved-stool work-up negative for any infection.   Hypokalemia: Due to GI loss-repleted.   Hyponatremia: Mild-watch closely.   HFpEF: Reasonably well compensated-tolerating initiation of diuretics well-and back on her usual diuretic regimen.   PAF: Continue amiodarone and Eliquis   History of junctional bradycardia: No longer on Cardizem/beta-blocker.  Continue telemetry monitoring   HTN: BP stable-continue amlodipine-should be able to resume  hydralazine and losartan on discharge as well.   HLD: Continue statin   PAD-s/p right TMA   History of carotid stenosis-s/p left CEA   Right leg swelling: Erythema/swelling spontaneously has improved-RLE Doppler negative for DVT.  Suspect-slightly worsening edema may have caused erythema.   Mechanical fall: Occurred on 6/8-this occurred while see attempted to grab her undergarments from the closet.  She is completely awake and alert-she claims to have hit her right shoulder-x-ray was negative for any fractures.  Pain is significantly better this morning-she has good range of motion.  On exam shoulder is without any swelling.  CKD stage IIIb: Close to baseline-follow periodically.   DM-2 (A1c 10.3 on 6/5): CBG slowly creeping up-we will resume her usual insulin regimen/oral hypoglycemic regimen on discharge.  Pressure Ulcer: Pressure Injury 05/04/21 Other (Comment) Right Stage 3 -  Full thickness tissue loss. Subcutaneous fat may be visible but bone, tendon or muscle are NOT exposed. 0.5x0.5, 1 inch depth (Active)  05/04/21 0630  Location: Other (Comment)  Location Orientation: Right  Staging: Stage 3 -  Full thickness tissue loss. Subcutaneous fat may be visible but bone, tendon or muscle are NOT exposed.  Wound Description (Comments): 0.5x0.5, 1 inch depth  Present on Admission: Yes  Dressing Type None 04/21/22 1213   Obesity: Estimated body mass index is 38.98 kg/m as calculated from the following:   Height as of this encounter: 5\' 4"  (1.626 m).   Weight as of this encounter: 103 kg.    Discharge Diagnoses:  Principal Problem:   Viral gastroenteritis  Active Problems:   DM (diabetes mellitus), type 2 with peripheral vascular complications ([name])   Long term current use of anticoagulant therapy   Hypothyroidism   Hypokalemia   Chronic combined systolic and diastolic CHF (congestive heart failure) ([name])   Severe obesity (BMI >= 40) ([name])   Depression   CAD (coronary artery  disease)   Low back pain   Morbid obesity ([name])   Stage 3b chronic kidney disease ([place])   Uncontrolled type 2 diabetes mellitus with hyperglycemia [facility])   Discharge Instructions:  Activity:  As tolerated with Full fall precautions use walker/cane & assistance as needed   Discharge Instructions     (HEART FAILURE PATIENTS) Call MD:  Anytime you have any of the following symptoms: 1) 3 pound weight gain in 24 hours or 5 pounds in 1 week 2) shortness of breath, with or without a dry hacking cough 3) swelling in the hands, feet or stomach 4) if you have to sleep on extra pillows at night in order to breathe.   Complete by: As directed    Call MD for:  persistant nausea and vomiting   Complete by: As directed    Diet - low sodium heart healthy   Complete by: As directed    Diet Carb Modified   Complete by: As directed    Discharge instructions   Complete by: As directed    Follow with Primary MD  Charlott Rakes, MD in 1-2 weeks  Please get a complete blood count and chemistry panel checked by your Primary MD at your next visit, and again as instructed by your Primary MD.  Get Medicines reviewed and adjusted: Please take all your medications with you for your next visit with your Primary MD  Laboratory/radiological data: Please request your Primary MD to go over all hospital tests and procedure/radiological results at the follow up, please ask your Primary MD to get all Hospital records sent to his/her office.  In some cases, they will be blood work, cultures and biopsy results pending at the time of your discharge. Please request that your primary care M.D. follows up on these results.  Also Note the following: If you experience worsening of your admission symptoms, develop shortness of breath, life threatening emergency, suicidal or homicidal thoughts you must seek medical attention immediately by calling 911 or calling your MD immediately  if symptoms less severe.  You must read  complete instructions/literature along with all the possible adverse reactions/side effects for all the Medicines you take and that have been prescribed to you. Take any new Medicines after you have completely understood and accpet all the possible adverse reactions/side effects.   Do not drive when taking Pain medications or sleeping medications (Benzodaizepines)  Do not take more than prescribed Pain, Sleep and Anxiety Medications. It is not advisable to combine anxiety,sleep and pain medications without talking with your primary care practitioner  Special Instructions: If you have smoked or chewed Tobacco  in the last 2 yrs please stop smoking, stop any regular Alcohol  and or any Recreational drug use.  Wear Seat belts while driving.  Please note: You were cared for by a hospitalist during your hospital stay. Once you are discharged, your primary care physician will handle any further medical issues. Please note that NO REFILLS for any discharge medications will be authorized once you are discharged, as it is imperative that you return to your primary care physician (or establish a relationship with a primary care physician if you do  not have one) for your post hospital discharge needs so that they can reassess your need for medications and monitor your lab values.   Increase activity slowly   Complete by: As directed    No wound care   Complete by: As directed       Allergies as of 04/22/2022       Reactions   Gabapentin Nausea And Vomiting, Other (See Comments)   POSSIBLE SHAKING   Lyrica [pregabalin] Other (See Comments)   Shaking        Medication List     TAKE these medications    Accu-Chek Guide test strip Generic drug: glucose blood USE TO CHECK BLOOD SUGAR FOUR TIMES DAILY   acetaminophen 500 MG tablet Commonly known as: TYLENOL Take 1,000 mg by mouth every 6 (six) hours as needed for headache (pain).   albuterol 108 (90 Base) MCG/ACT inhaler Commonly known as:  VENTOLIN HFA Inhale 2 puffs into the lungs every 6 (six) hours as needed for wheezing or shortness of breath.   allopurinol 100 MG tablet Commonly known as: ZYLOPRIM TAKE 1 TABLET (100 MG TOTAL) BY MOUTH DAILY(AM) What changed: See the new instructions.   amiodarone 200 MG tablet Commonly known as: PACERONE TAKE 1/2 TABLET (100 MG TOTAL) BY MOUTH DAILY (AM) What changed: See the new instructions.   amLODipine 10 MG tablet Commonly known as: NORVASC Take 1 tablet (10 mg total) by mouth daily. What changed: when to take this   apixaban 5 MG Tabs tablet Commonly known as: Eliquis Take 1 tablet (5 mg total) by mouth 2 (two) times daily.   atorvastatin 40 MG tablet Commonly known as: LIPITOR TAKE 1 TABLET (40 MG TOTAL) BY MOUTH EVERY EVENING (BEDTIME) What changed: See the new instructions.   BC HEADACHE POWDER PO Take 1 packet by mouth 4 (four) times daily as needed (pain/headache).   budesonide-formoterol 160-4.5 MCG/ACT inhaler Commonly known as: Symbicort Inhale 2 puffs into the lungs 2 (two) times daily.   buPROPion 150 MG 12 hr tablet Commonly known as: WELLBUTRIN SR Take 150 mg by mouth 2 (two) times daily.   Easy Comfort Pen Needles 31G X 5 MM Misc Generic drug: Insulin Pen Needle USE FOUR TIMES A DAY FOR INSULIN ADMINISTRATION   FeroSul 325 (65 FE) MG tablet Generic drug: ferrous sulfate TAKE 1 TABLET (324 MG TOTAL) BY MOUTH 2 (TWO) TIMES DAILY AS NEEDED. (AM+BEDTIME) What changed: See the new instructions.   FISH OIL PO Take 1 capsule by mouth every morning.   FLUoxetine 20 MG capsule Commonly known as: PROZAC TAKE 1 CAPSULE (20 MG TOTAL) BY MOUTH AT BEDTIME.   folic acid 1 MG tablet Commonly known as: FOLVITE TAKE 1 TABLET (1 MG TOTAL) BY MOUTH DAILY. What changed: when to take this   FreeStyle Libre 2 Sensor Misc USE 1 (ONE) EACH EVERY 2 WEEKS   hydrALAZINE 50 MG tablet Commonly known as: APRESOLINE Take 1.5 tablets (75 mg total) by mouth 3  (three) times daily.   insulin glargine 100 UNIT/ML Solostar Pen Commonly known as: LANTUS Inject 30 Units into the skin daily. What changed:  how much to take when to take this   insulin lispro 100 UNIT/ML KwikPen Commonly known as: HUMALOG Inject 8 Units into the skin 3 (three) times daily with meals.   Jardiance 10 MG Tabs tablet Generic drug: empagliflozin TAKE 1 TABLET (10 MG TOTAL) BY MOUTH DAILY BEFORE BREAKFAST (AM) What changed: See the new instructions.   levothyroxine 50  MCG tablet Commonly known as: SYNTHROID TAKE 1 TABLET (50 MCG TOTAL) BY MOUTH DAILY (AM) What changed: See the new instructions.   losartan 25 MG tablet Commonly known as: COZAAR TAKE 2 TABLETS (50 MG TOTAL) BY MOUTH DAILY (AM) What changed: See the new instructions.   ondansetron 4 MG tablet Commonly known as: Zofran Take 1 tablet (4 mg total) by mouth every 8 (eight) hours as needed for nausea or vomiting.   pantoprazole 40 MG tablet Commonly known as: PROTONIX TAKE 1 TABLET (40 MG TOTAL) BY MOUTH DAILY(AM) What changed: See the new instructions.   spironolactone 25 MG tablet Commonly known as: ALDACTONE Take 0.5 tablets (12.5 mg total) by mouth daily.   torsemide 20 MG tablet Commonly known as: DEMADEX Take 80 mg in am and 60 mg in pm What changed:  how much to take how to take this when to take this additional instructions   Trulicity 2.62 MB/5.5HR Sopn Generic drug: Dulaglutide Inject 0.75 mg into the skin every Friday.   VITAMIN C PO Take 1 tablet by mouth every morning.   VITAMIN D3 PO Take 1 capsule by mouth every morning.        Follow-up Information     Charlott Rakes, MD. Schedule an appointment as soon as possible for a visit in 1 week(s).   Specialty: Family Medicine Contact information: [place] [address] [phone]         [name], MD. Schedule an appointment as soon as possible for a visit in 1 month(s).    Specialties: Cardiology, Radiology Contact information: [address] [place] [address] [phone]         [name], MD Follow up in 1 month(s).   Specialties: Cardiology, Radiology Contact information: [address] [place] [address] [phone]                Allergies  Allergen Reactions   Gabapentin Nausea And Vomiting and Other (See Comments)    POSSIBLE SHAKING   Lyrica [Pregabalin] Other (See Comments)    Shaking      Other Procedures/Studies: DG Shoulder Right  Result Date: 04/21/2022 CLINICAL DATA:  Right shoulder pain following fall, initial encounter EXAM: RIGHT SHOULDER - 2+ VIEW COMPARISON:  09/12/2021 FINDINGS: Mild degenerative changes of the acromioclavicular joint are seen. No acute fracture or dislocation is noted. Underlying bony thorax is within normal limits. No soft tissue changes are seen. IMPRESSION: No acute abnormality noted. Electronically Signed   By: Inez Catalina M.D.   On: 04/21/2022 20:48   VAS [place] LOWER EXTREMITY VENOUS (DVT)  Result Date: 04/21/2022  Lower Venous DVT Study Patient Name:  [name]  Date of Exam:   04/21/2022 Medical Rec #: [id number]      Accession #:    [id number] Date of Birth: 17-Nov-1960     Patient Gender: F Patient Age:   62 years Exam Location:  [facility] Procedure:      VAS [place] LOWER EXTREMITY VENOUS (DVT) Referring Phys: Oren Binet --------------------------------------------------------------------------------  Indications: Swelling.  Risk Factors: None identified. Limitations: Poor ultrasound/tissue interface, body habitus and patient pain tolerance. Comparison Study: No prior studies. Performing Technologist: Oliver Hum RVT  Examination Guidelines: A complete evaluation includes B-mode imaging, spectral Doppler, color Doppler, and power Doppler as needed of all accessible portions of each vessel. Bilateral testing is considered an integral part of a  complete examination. Limited examinations for  reoccurring indications may be performed as noted. The reflux portion of the exam is performed with the patient in reverse Trendelenburg.  +---------+---------------+---------+-----------+----------+-------------------+ RIGHT    CompressibilityPhasicitySpontaneityPropertiesThrombus Aging      +---------+---------------+---------+-----------+----------+-------------------+ CFV      Full           Yes      Yes                                      +---------+---------------+---------+-----------+----------+-------------------+ SFJ      Full                                                             +---------+---------------+---------+-----------+----------+-------------------+ FV Prox  Full                                                             +---------+---------------+---------+-----------+----------+-------------------+ FV Mid   Full                                                             +---------+---------------+---------+-----------+----------+-------------------+ FV Distal               Yes      Yes                                      +---------+---------------+---------+-----------+----------+-------------------+ PFV      Full                                                             +---------+---------------+---------+-----------+----------+-------------------+ POP      Full           Yes      Yes                                      +---------+---------------+---------+-----------+----------+-------------------+ PTV                                                   Patency shown with                                                        color doppler       +---------+---------------+---------+-----------+----------+-------------------+  PERO                                                  Patency shown with                                                         color doppler       +---------+---------------+---------+-----------+----------+-------------------+   +----+---------------+---------+-----------+----------+--------------+ LEFTCompressibilityPhasicitySpontaneityPropertiesThrombus Aging +----+---------------+---------+-----------+----------+--------------+ CFV Full           Yes      Yes                                 +----+---------------+---------+-----------+----------+--------------+     Summary: RIGHT: - There is no evidence of deep vein thrombosis in the lower extremity. However, portions of this examination were limited- see technologist comments above.  - No cystic structure found in the popliteal fossa.  LEFT: - No evidence of common femoral vein obstruction.  *See table(s) above for measurements and observations. Electronically signed by Monica Martinez MD on 04/21/2022 at 3:09:16 PM.    Final    CT ABDOMEN PELVIS W CONTRAST  Result Date: 04/18/2022 CLINICAL DATA:  Left lower quadrant pain. EXAM: CT ABDOMEN AND PELVIS WITH CONTRAST TECHNIQUE: Multidetector CT imaging of the abdomen and pelvis was performed using the standard protocol following bolus administration of intravenous contrast. RADIATION DOSE REDUCTION: This exam was performed according to the departmental dose-optimization program which includes automated exposure control, adjustment of the mA and/or kV according to patient size and/or use of iterative reconstruction technique. CONTRAST:  33mL OMNIPAQUE IOHEXOL 300 MG/ML  SOLN COMPARISON:  None Available. FINDINGS: Lower chest: No acute abnormality. Hepatobiliary: No focal liver abnormality is seen. No gallstones, gallbladder wall thickening, or biliary dilatation. Pancreas: Unremarkable. No pancreatic ductal dilatation or surrounding inflammatory changes. Spleen: Normal in size without focal abnormality. Adrenals/Urinary Tract: Adrenal glands are unremarkable. Kidneys are normal in size, without renal calculi or  hydronephrosis. A 2.0 cm fat density lesion is seen within the anterior aspect of the mid to lower right kidney (approximately -70.25 Hounsfield units). Bladder is unremarkable. Stomach/Bowel: Stomach is within normal limits. Appendix appears normal. No evidence of bowel wall thickening, distention, or inflammatory changes. Vascular/Lymphatic: Aortic atherosclerosis. No enlarged abdominal or pelvic lymph nodes. Reproductive: Uterus and bilateral adnexa are unremarkable. Other: No abdominal wall hernia or abnormality. No abdominopelvic ascites. Musculoskeletal: No acute or significant osseous findings. IMPRESSION: 1. No acute or active process within the abdomen or pelvis. 2. 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 3. Aortic atherosclerosis. Aortic Atherosclerosis (ICD10-I70.0). Electronically Signed   By: Virgina Norfolk M.D.   On: 04/18/2022 02:52   DG Chest Port 1 View  Result Date: 04/18/2022 CLINICAL DATA:  Cough and shortness of breath. EXAM: PORTABLE CHEST 1 VIEW COMPARISON:  None Available. FINDINGS: The heart size and mediastinal contours are within normal limits. A CardioMEMS device is seen projecting over the left hilum. Both lungs are clear. The visualized skeletal structures are unremarkable. IMPRESSION: No active disease. Electronically Signed   By: Virgina Norfolk M.D.   On: 04/18/2022 02:14     TODAY-DAY OF DISCHARGE:  Subjective:   [name] today has no headache,no chest abdominal pain,no new weakness tingling or numbness, feels much better wants to go home today.   Objective:   Blood pressure (!) 142/77, pulse 86, temperature 98.7 F (37.1 C), temperature source Oral, resp. rate 15, height 5\' 4"  (1.626 m), weight 103 kg, SpO2 93 %.  Intake/Output Summary (Last 24 hours) at 04/22/2022 0850 Last data filed at 04/22/2022 0600 Gross per 24 hour  Intake 480 ml  Output 2900 ml  Net -2420 ml   Filed Weights   04/20/22 0500 04/21/22 0455 04/22/22 0500  Weight: 102.4 kg 104.2 kg 103  kg    Exam: Awake Alert, Oriented *3, No new F.N deficits, Normal affect [place].AT,PERRAL Supple Neck,No JVD, No cervical lymphadenopathy appriciated.  Symmetrical Chest wall movement, Good air movement bilaterally, CTAB RRR,No Gallops,Rubs or new Murmurs, No Parasternal Heave +ve B.Sounds, Abd Soft, Non tender, No organomegaly appriciated, No rebound -guarding or rigidity. No Cyanosis, Clubbing or edema, No new Rash or bruise   PERTINENT RADIOLOGIC STUDIES: DG Shoulder Right  Result Date: 04/21/2022 CLINICAL DATA:  Right shoulder pain following fall, initial encounter EXAM: RIGHT SHOULDER - 2+ VIEW COMPARISON:  09/12/2021 FINDINGS: Mild degenerative changes of the acromioclavicular joint are seen. No acute fracture or dislocation is noted. Underlying bony thorax is within normal limits. No soft tissue changes are seen. IMPRESSION: No acute abnormality noted. Electronically Signed   By: Inez Catalina M.D.   On: 04/21/2022 20:48   VAS [place] LOWER EXTREMITY VENOUS (DVT)  Result Date: 04/21/2022  Lower Venous DVT Study Patient Name:  [name]  Date of Exam:   04/21/2022 Medical Rec #: [id number]      Accession #:    [id number] Date of Birth: 05-12-1961     Patient Gender: F Patient Age:   33 years Exam [facility]  [facility] Procedure:      VAS [place] LOWER EXTREMITY VENOUS (DVT) Referring Phys: Oren Binet --------------------------------------------------------------------------------  Indications: Swelling.  Risk Factors: None identified. Limitations: Poor ultrasound/tissue interface, body habitus and patient pain tolerance. Comparison Study: No prior studies. Performing Technologist: Oliver Hum RVT  Examination Guidelines: A complete evaluation includes B-mode imaging, spectral Doppler, color Doppler, and power Doppler as needed of all accessible portions of each vessel. Bilateral testing is considered an integral part of a complete examination. Limited examinations for reoccurring  indications may be performed as noted. The reflux portion of the exam is performed with the patient in reverse Trendelenburg.  +---------+---------------+---------+-----------+----------+-------------------+ RIGHT    CompressibilityPhasicitySpontaneityPropertiesThrombus Aging      +---------+---------------+---------+-----------+----------+-------------------+ CFV      Full           Yes      Yes                                      +---------+---------------+---------+-----------+----------+-------------------+ SFJ      Full                                                             +---------+---------------+---------+-----------+----------+-------------------+ FV Prox  Full                                                             +---------+---------------+---------+-----------+----------+-------------------+  FV Mid   Full                                                             +---------+---------------+---------+-----------+----------+-------------------+ FV Distal               Yes      Yes                                      +---------+---------------+---------+-----------+----------+-------------------+ PFV      Full                                                             +---------+---------------+---------+-----------+----------+-------------------+ POP      Full           Yes      Yes                                      +---------+---------------+---------+-----------+----------+-------------------+ PTV                                                   Patency shown with                                                        color doppler       +---------+---------------+---------+-----------+----------+-------------------+ PERO                                                  Patency shown with                                                        color doppler        +---------+---------------+---------+-----------+----------+-------------------+   +----+---------------+---------+-----------+----------+--------------+ LEFTCompressibilityPhasicitySpontaneityPropertiesThrombus Aging +----+---------------+---------+-----------+----------+--------------+ CFV Full           Yes      Yes                                 +----+---------------+---------+-----------+----------+--------------+     Summary: RIGHT: - There is no evidence of deep vein thrombosis in the lower extremity. However, portions of this examination were limited- see technologist comments above.  - No cystic structure found in the popliteal fossa.  LEFT: - No evidence of common femoral vein  obstruction.  *See table(s) above for measurements and observations. Electronically signed by Monica Martinez MD on 04/21/2022 at 3:09:16 PM.    Final      PERTINENT LAB RESULTS: CBC: Recent Labs    04/20/22 0134 04/21/22 0359  WBC 17.0* 8.6  HGB 12.2 11.0*  HCT 36.5 33.8*  PLT 174 216   CMET CMP     Component Value Date/Time   NA 132 (L) 04/21/2022 0359   NA 139 06/02/2021 1405   K 4.0 04/21/2022 0359   CL 98 04/21/2022 0359   CO2 27 04/21/2022 0359   GLUCOSE 173 (H) 04/21/2022 0359   BUN 43 (H) 04/21/2022 0359   BUN 42 (H) 06/02/2021 1405   CREATININE 1.59 (H) 04/21/2022 0359   CREATININE 1.09 (H) 01/25/2016 0947   CALCIUM 8.7 (L) 04/21/2022 0359   PROT 6.0 (L) 04/21/2022 0359   ALBUMIN 2.4 (L) 04/21/2022 0359   AST 15 04/21/2022 0359   ALT 28 04/21/2022 0359   ALKPHOS 129 (H) 04/21/2022 0359   BILITOT 0.6 04/21/2022 0359   GFRNONAA 37 (L) 04/21/2022 0359   GFRAA 39 (L) 06/08/2020 1130    GFR Estimated Creatinine Clearance: 44.5 mL/min (A) (by C-G formula based on SCr of 1.59 mg/dL (H)). No results for input(s): "LIPASE", "AMYLASE" in the last 72 hours. No results for input(s): "CKTOTAL", "CKMB", "CKMBINDEX", "TROPONINI" in the last 72 hours. Invalid input(s): "POCBNP" No  results for input(s): "DDIMER" in the last 72 hours. No results for input(s): "HGBA1C" in the last 72 hours. No results for input(s): "CHOL", "HDL", "LDLCALC", "TRIG", "CHOLHDL", "LDLDIRECT" in the last 72 hours. No results for input(s): "TSH", "T4TOTAL", "T3FREE", "THYROIDAB" in the last 72 hours.  Invalid input(s): "FREET3" No results for input(s): "VITAMINB12", "FOLATE", "FERRITIN", "TIBC", "IRON", "RETICCTPCT" in the last 72 hours. Coags: No results for input(s): "INR" in the last 72 hours.  Invalid input(s): "PT" Microbiology: Recent Results (from the past 240 hour(s))  SARS Coronavirus 2 by RT PCR (hospital order, performed in [facility] hospital lab) *cepheid single result test* Anterior Nasal Swab     Status: None   Collection Time: 04/18/22  1:47 AM   Specimen: Anterior Nasal Swab  Result Value Ref Range Status   SARS Coronavirus 2 by RT PCR NEGATIVE NEGATIVE Final    Comment: (NOTE) SARS-CoV-2 target nucleic acids are NOT DETECTED.  The SARS-CoV-2 RNA is generally detectable in upper and lower respiratory specimens during the acute phase of infection. The lowest concentration of SARS-CoV-2 viral copies this assay can detect is 250 copies / mL. A negative result does not preclude SARS-CoV-2 infection and should not be used as the sole basis for treatment or other patient management decisions.  A negative result may occur with improper specimen collection / handling, submission of specimen other than nasopharyngeal swab, presence of viral mutation(s) within the areas targeted by this assay, and inadequate number of viral copies (<250 copies / mL). A negative result must be combined with clinical observations, patient history, and epidemiological information.  Fact Sheet for Patients:   https://www.patel.info/  Fact Sheet for Healthcare Providers: https://hall.com/  This test is not yet approved or  cleared by the [place]  FDA and has been authorized for detection and/or diagnosis of SARS-CoV-2 by FDA under an Emergency Use Authorization (EUA).  This EUA will remain in effect (meaning this test can be used) for the duration of the COVID-19 declaration under Section 564(b)(1) of the Act, 21 U.S.C. section 360bbb-3(b)(1), unless the authorization is  terminated or revoked sooner.  Performed at [facility], [place] [address] [place], [place] [address]   C Difficile Quick Screen w PCR reflex     Status: None   Collection Time: 04/18/22  1:47 AM   Specimen: Stool  Result Value Ref Range Status   C Diff antigen NEGATIVE NEGATIVE Final   C Diff toxin NEGATIVE NEGATIVE Final   C Diff interpretation No C. difficile detected.  Final    Comment: Performed at [facility], [place] [address] [place], [place] [address]  Gastrointestinal Panel by PCR , Stool     Status: None   Collection Time: 04/18/22  3:23 AM   Specimen: Stool  Result Value Ref Range Status   Campylobacter species NOT DETECTED NOT DETECTED Final   Plesimonas shigelloides NOT DETECTED NOT DETECTED Final   Salmonella species NOT DETECTED NOT DETECTED Final   Yersinia enterocolitica NOT DETECTED NOT DETECTED Final   Vibrio species NOT DETECTED NOT DETECTED Final   Vibrio cholerae NOT DETECTED NOT DETECTED Final   Enteroaggregative E coli (EAEC) NOT DETECTED NOT DETECTED Final   Enteropathogenic E coli (EPEC) NOT DETECTED NOT DETECTED Final   Enterotoxigenic E coli (ETEC) NOT DETECTED NOT DETECTED Final   Shiga like toxin producing E coli (STEC) NOT DETECTED NOT DETECTED Final   Shigella/Enteroinvasive E coli (EIEC) NOT DETECTED NOT DETECTED Final   Cryptosporidium NOT DETECTED NOT DETECTED Final   Cyclospora cayetanensis NOT DETECTED NOT DETECTED Final   Entamoeba histolytica NOT DETECTED NOT DETECTED Final   Giardia lamblia NOT DETECTED NOT DETECTED Final   Adenovirus F40/41 NOT DETECTED NOT DETECTED Final   Astrovirus NOT DETECTED NOT  DETECTED Final   Norovirus GI/GII NOT DETECTED NOT DETECTED Final   Rotavirus A NOT DETECTED NOT DETECTED Final   Sapovirus (I, II, IV, and V) NOT DETECTED NOT DETECTED Final    Comment: Performed at [facility], [place] [place], [place] [address]  Respiratory (~20 pathogens) panel by PCR     Status: None   Collection Time: 04/18/22 11:29 AM   Specimen: Nasopharyngeal Swab; Respiratory  Result Value Ref Range Status   Adenovirus NOT DETECTED NOT DETECTED Final   Coronavirus 229E NOT DETECTED NOT DETECTED Final    Comment: (NOTE) The Coronavirus on the Respiratory Panel, DOES NOT test for the novel  Coronavirus (2019 nCoV)    Coronavirus HKU1 NOT DETECTED NOT DETECTED Final   Coronavirus NL63 NOT DETECTED NOT DETECTED Final   Coronavirus OC43 NOT DETECTED NOT DETECTED Final   Metapneumovirus NOT DETECTED NOT DETECTED Final   Rhinovirus / Enterovirus NOT DETECTED NOT DETECTED Final   Influenza A NOT DETECTED NOT DETECTED Final   Influenza B NOT DETECTED NOT DETECTED Final   Parainfluenza Virus 1 NOT DETECTED NOT DETECTED Final   Parainfluenza Virus 2 NOT DETECTED NOT DETECTED Final   Parainfluenza Virus 3 NOT DETECTED NOT DETECTED Final   Parainfluenza Virus 4 NOT DETECTED NOT DETECTED Final   Respiratory Syncytial Virus NOT DETECTED NOT DETECTED Final   Bordetella pertussis NOT DETECTED NOT DETECTED Final   Bordetella Parapertussis NOT DETECTED NOT DETECTED Final   Chlamydophila pneumoniae NOT DETECTED NOT DETECTED Final   Mycoplasma pneumoniae NOT DETECTED NOT DETECTED Final    Comment: Performed at [facility], [place]. [address] [place], [place] [address]    FURTHER DISCHARGE INSTRUCTIONS:  Get Medicines reviewed and adjusted: Please take all your medications with you for your next visit with your  Primary MD  Laboratory/radiological data: Please request your Primary MD to go over all hospital tests and procedure/radiological results at the follow up,  please ask your Primary MD to get all Hospital records sent to his/her office.  In some cases, they will be blood work, cultures and biopsy results pending at the time of your discharge. Please request that your primary care M.D. goes through all the records of your hospital data and follows up on these results.  Also Note the following: If you experience worsening of your admission symptoms, develop shortness of breath, life threatening emergency, suicidal or homicidal thoughts you must seek medical attention immediately by calling 911 or calling your MD immediately  if symptoms less severe.  You must read complete instructions/literature along with all the possible adverse reactions/side effects for all the Medicines you take and that have been prescribed to you. Take any new Medicines after you have completely understood and accpet all the possible adverse reactions/side effects.   Do not drive when taking Pain medications or sleeping medications (Benzodaizepines)  Do not take more than prescribed Pain, Sleep and Anxiety Medications. It is not advisable to combine anxiety,sleep and pain medications without talking with your primary care practitioner  Special Instructions: If you have smoked or chewed Tobacco  in the last 2 yrs please stop smoking, stop any regular Alcohol  and or any Recreational drug use.  Wear Seat belts while driving.  Please note: You were cared for by a hospitalist during your hospital stay. Once you are discharged, your primary care physician will handle any further medical issues. Please note that NO REFILLS for any discharge medications will be authorized once you are discharged, as it is imperative that you return to your primary care physician (or establish a relationship with a primary care physician if you do not have one) for your post hospital discharge needs so that they can reassess your need for medications and monitor your lab values.  Total Time spent  coordinating discharge including counseling, education and face to face time equals greater than 30 minutes.  SignedOren Binet 04/22/2022 8:50 AM

## 2022-04-25 ENCOUNTER — Ambulatory Visit (INDEPENDENT_AMBULATORY_CARE_PROVIDER_SITE_OTHER): Payer: Medicare Other | Admitting: Podiatry

## 2022-04-25 ENCOUNTER — Telehealth (HOSPITAL_COMMUNITY): Payer: Self-pay

## 2022-04-25 ENCOUNTER — Encounter: Payer: Self-pay | Admitting: Podiatry

## 2022-04-25 ENCOUNTER — Telehealth: Payer: Self-pay

## 2022-04-25 DIAGNOSIS — E1151 Type 2 diabetes mellitus with diabetic peripheral angiopathy without gangrene: Secondary | ICD-10-CM

## 2022-04-25 DIAGNOSIS — D689 Coagulation defect, unspecified: Secondary | ICD-10-CM | POA: Diagnosis not present

## 2022-04-25 DIAGNOSIS — M79674 Pain in right toe(s): Secondary | ICD-10-CM

## 2022-04-25 DIAGNOSIS — L84 Corns and callosities: Secondary | ICD-10-CM

## 2022-04-25 DIAGNOSIS — L03119 Cellulitis of unspecified part of limb: Secondary | ICD-10-CM | POA: Diagnosis not present

## 2022-04-25 DIAGNOSIS — M79675 Pain in left toe(s): Secondary | ICD-10-CM

## 2022-04-25 DIAGNOSIS — B351 Tinea unguium: Secondary | ICD-10-CM

## 2022-04-25 DIAGNOSIS — N179 Acute kidney failure, unspecified: Secondary | ICD-10-CM

## 2022-04-25 DIAGNOSIS — E11628 Type 2 diabetes mellitus with other skin complications: Secondary | ICD-10-CM

## 2022-04-25 IMAGING — DX DG CHEST 2V
2 series · 2 of 2 positions shown · non-contrast
Comparison: Chest x-ray 07/29/2021

CLINICAL DATA: Shortness of breath.

EXAM:
CHEST - 2 VIEW

[chest ap]
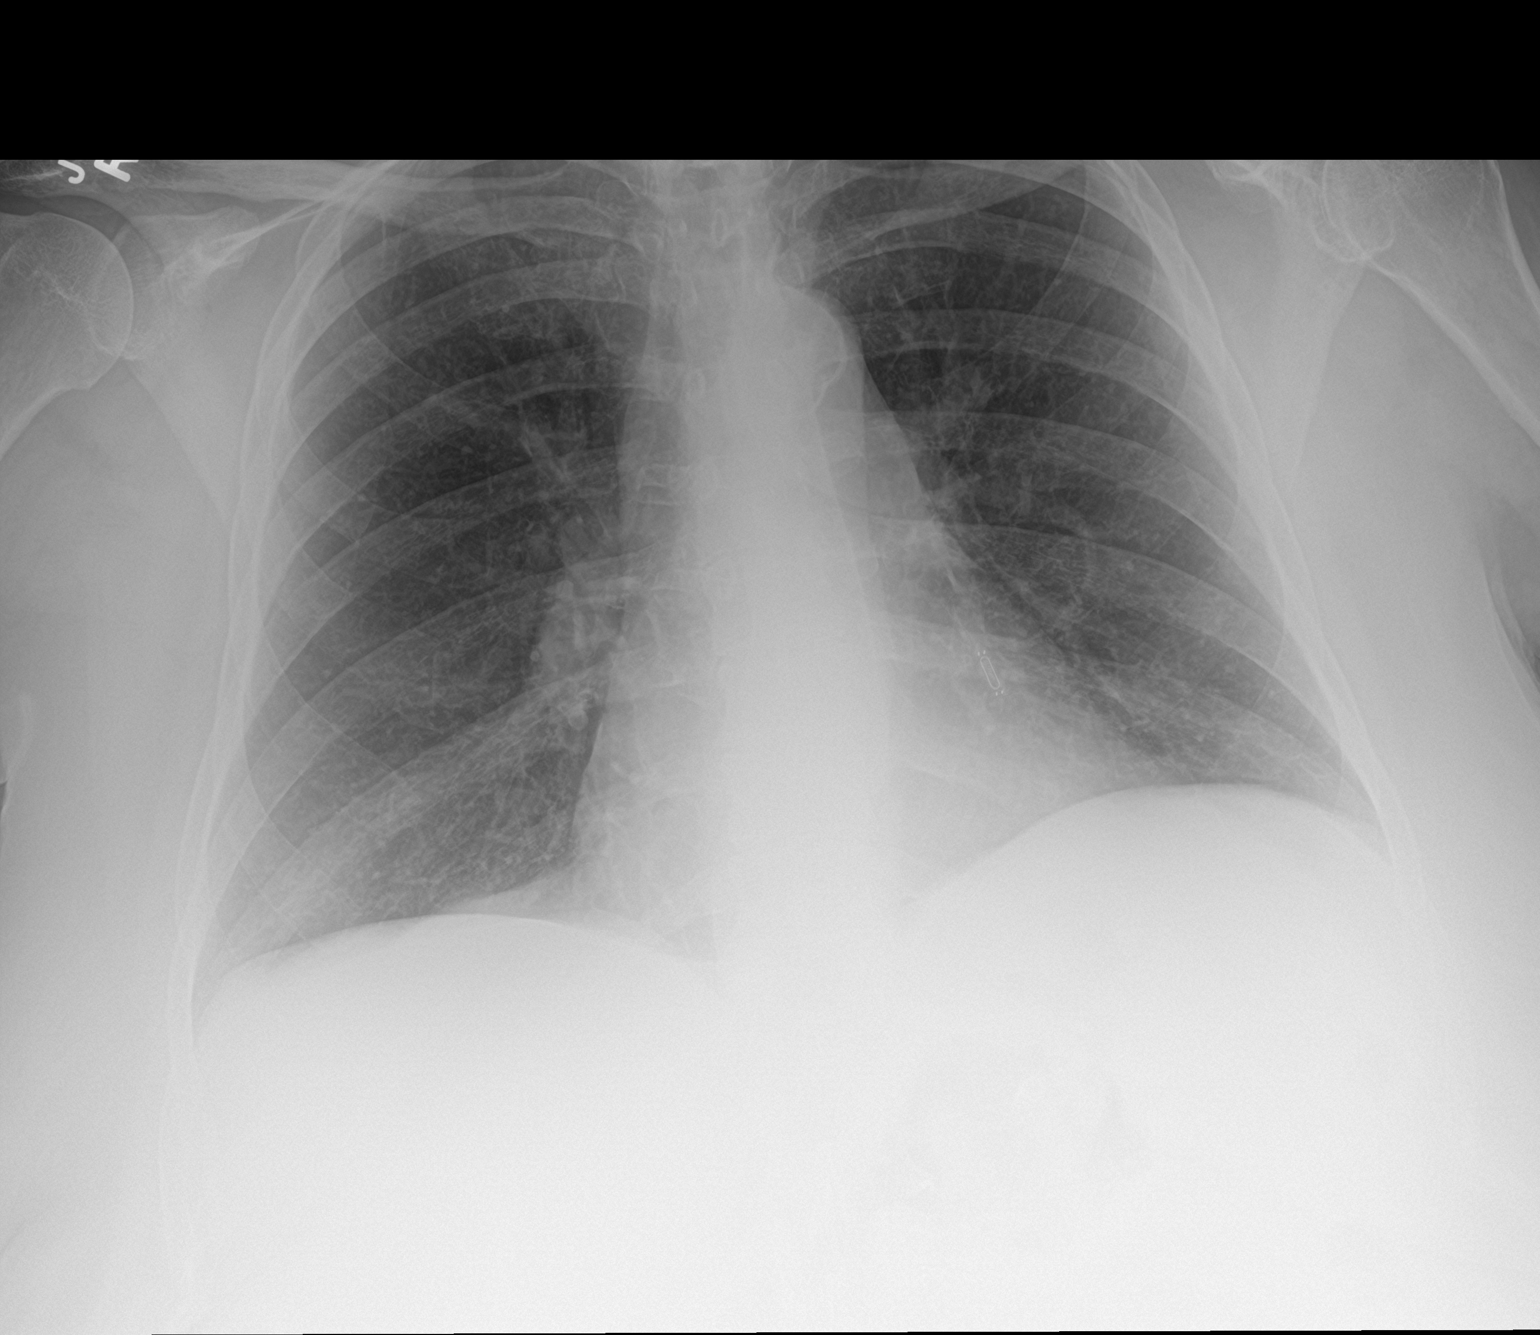

[chest lat]
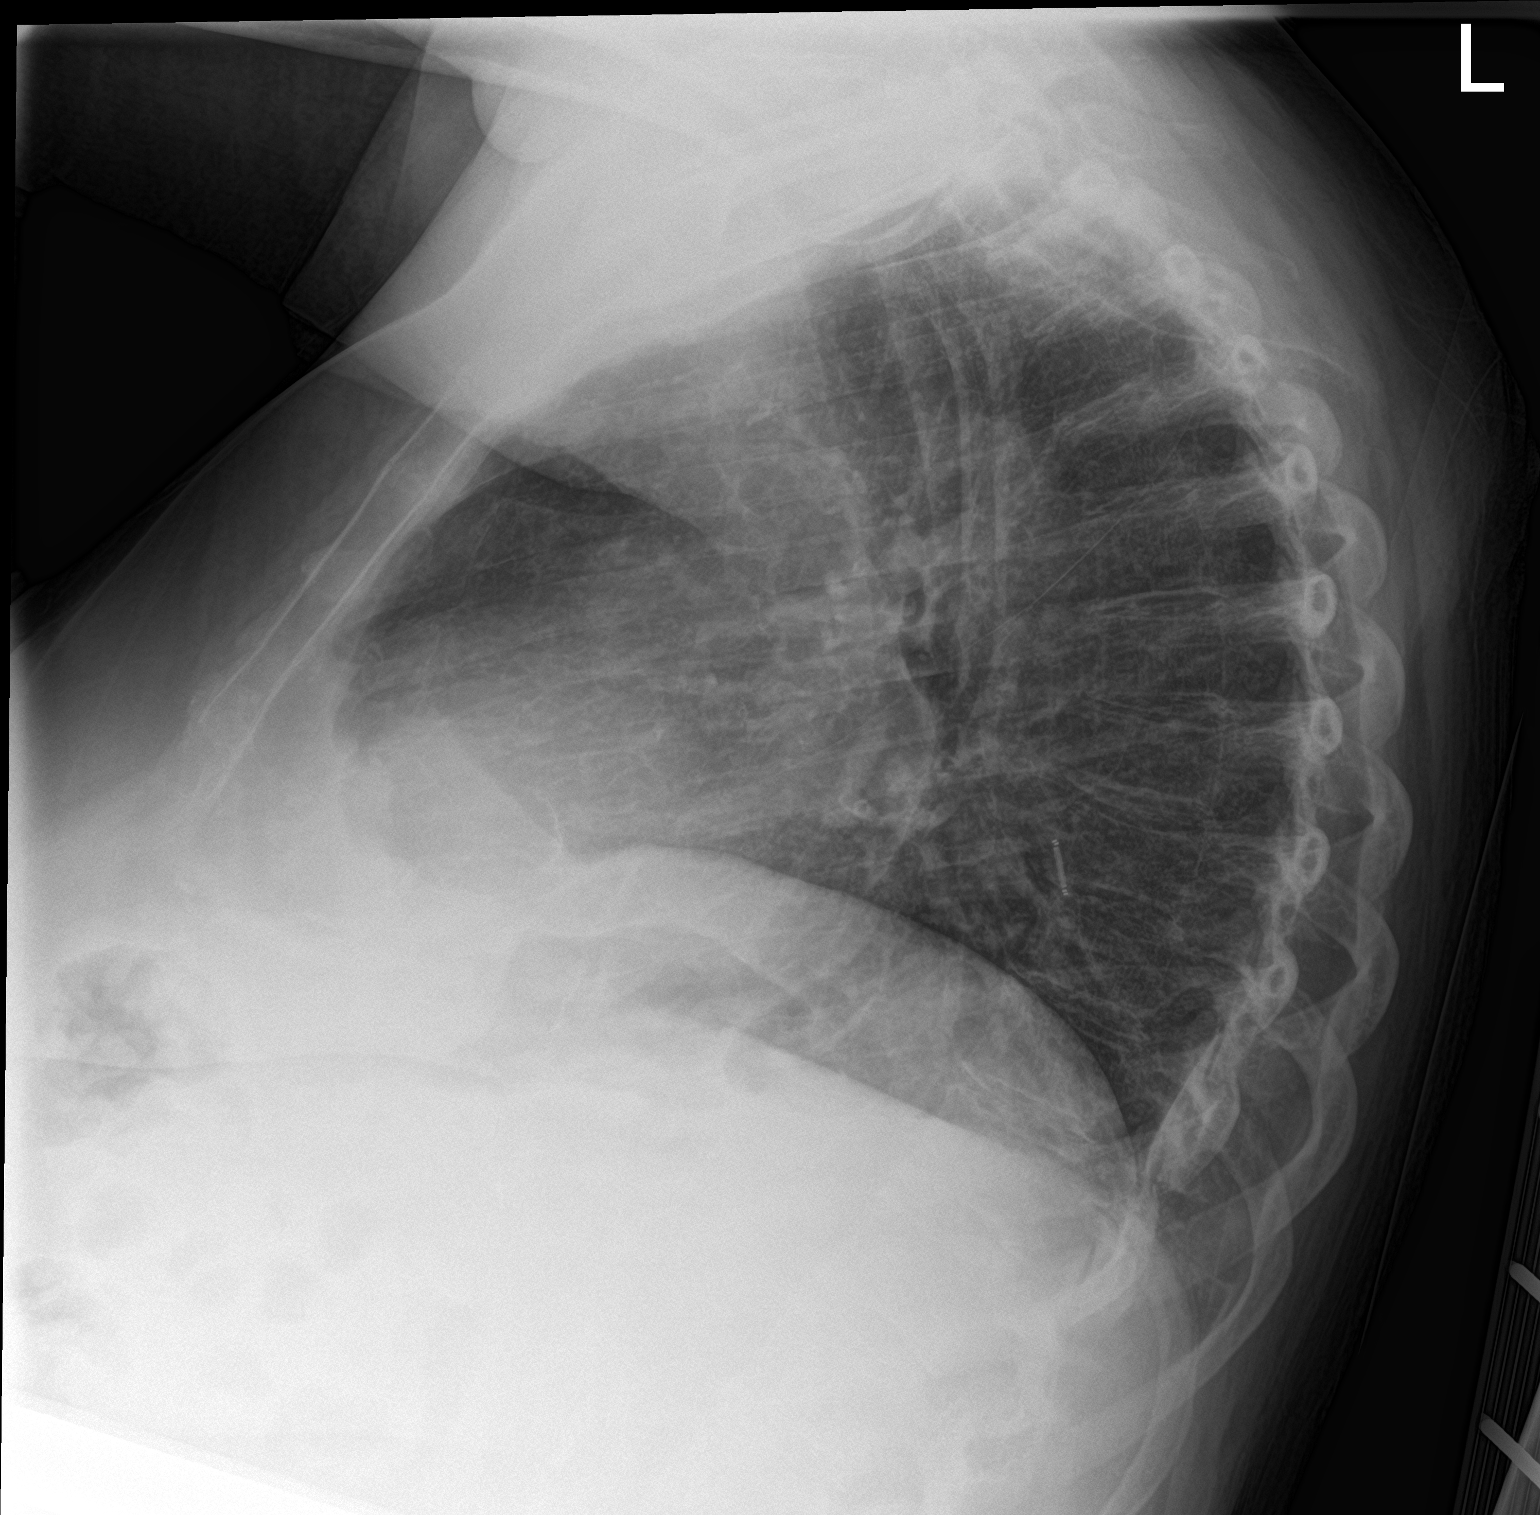

[2 of 2 positions shown; findings below may reference images not displayed]

FINDINGS: The cardiac silhouette, mediastinal and hilar contours are within
normal limits and stable. Mild tortuosity and calcification of the
thoracic aorta.

Small metallic device noted over the left infrahilar region likely a
small pulmonary artery pressure monitor.

No acute pulmonary findings. No pleural effusions. No pulmonary
lesions. The bony thorax is intact.
IMPRESSION: No acute cardiopulmonary findings.

## 2022-04-25 MED ORDER — DOXYCYCLINE HYCLATE 100 MG PO TABS: 100.0000 mg | ORAL_TABLET | Freq: Two times a day (BID) | ORAL | 0 refills | Status: AC

## 2022-04-25 NOTE — Telephone Encounter (Signed)
Transition Care Management Unsuccessful Follow-up Telephone Call  Date of discharge and from where:  04/22/2022, [facility]  Attempts:  1st Attempt  Reason for unsuccessful TCM follow-up call:  Left voice message on # [phone], call back requested.  Need to schedule a hospital follow up appointment

## 2022-04-25 NOTE — Addendum Note (Signed)
Addended byDeidre Ala, Hiral Lukasiewicz L on: 04/25/2022 01:18 PM   Modules accepted: Orders

## 2022-04-25 NOTE — Progress Notes (Signed)
This patient presents to the office for nail care and two painful calluses on both feet.  Patient has not been seen in over 14 months.  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   Patient has history of uncontrolled diabetes PAD, CKD, PVD and coagulation defect.  She says she was just released from the hospital for viral stomach infection.  Upon sitting in the treatment chair there was significant swelling and redness in the back of her right leg.  She says the swelling has lessened over the last few days but there is still significant pain associated with her posterior leg.  I proceeded to provide  routine foot care on her feet but contacted Dr.  Jacqualyn Posey for an examination of her right leg.  General Appearance  Alert, conversant and in no acute stress.  Vascular  Dorsalis pedis and posterior tibial  pulses are not  palpable  bilaterally.  Capillary return is within normal limits  bilaterally. Cold feet.  bilaterally.  Neurologic  Senn-Weinstein monofilament wire test diminished.   bilaterally. Muscle power within normal limits bilaterally.  Nails Thick disfigured discolored nails with subungual debris  from hallux to fourth toe left foot.. No evidence of bacterial infection or drainage bilaterally.  Orthopedic  No limitations of motion  feet .  No crepitus or effusions noted.  No bony pathology or digital deformities noted.  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  There is significant redness and swelling and pain associated with her right lower leg.    Skin  normotropic skin with no porokeratosis noted bilaterally.  No signs of infections or ulcers noted. Preulcerous callus sub 1 right foot.  Callus sub 5th metabase left foot.    Onychomycosis 1-4 left foot.  Preulcerous callus right forefoot.  Cellulitis right lower leg. Callus left foot.  Debride nails 1-4 left foot with nail nipper followed by dremel tool.   Debride preulcerous callus right forefoot with # 15  blade followed by dremel tool.  Debride callus left foot with # 15 blade.  Addendum  Dr  Jacqualyn Posey consulted on this patient with me.  He studied the vascular study from  3 days ago which was negative for clotting.  Due to the redness and swelling and pain in her right lower leg he recommended we treat her with antibiotics.  He also recommended we apply an ace bandage to reduce the swelling in lower leg.  Doxycycline was called to the pharmacy for this patient.  Finally she was to have vascular studies ordered He recommended she return in 2 weeks for follow up on her leg.  Patient to see one of the medical doctors.     Gardiner Barefoot DPM

## 2022-04-26 ENCOUNTER — Telehealth: Payer: Self-pay

## 2022-04-26 NOTE — Telephone Encounter (Signed)
Transition Care Management Unsuccessful Follow-up Telephone Call  Date of discharge and from where:  04/22/2022, [facility]  Attempts:  2nd Attempt  Reason for unsuccessful TCM follow-up call:  Left voice message on # [phone], call back requested.  Need to schedule a hospital follow up appointment

## 2022-04-27 ENCOUNTER — Telehealth: Payer: Self-pay

## 2022-04-27 NOTE — Telephone Encounter (Signed)
Transition Care Management Unsuccessful Follow-up Telephone Call  Date of discharge and from where:  04/22/2022, [facility]  Attempts:  3rd Attempt  Reason for unsuccessful TCM follow-up call:  Left voice message message on # [phone], call back requested.  Need to schedule a hospital follow up appointment       Letter also sent to patient requesting she call the clinic as we have not been able to reach her.

## 2022-05-05 ENCOUNTER — Other Ambulatory Visit (HOSPITAL_COMMUNITY): Payer: Self-pay

## 2022-05-05 ENCOUNTER — Ambulatory Visit (HOSPITAL_COMMUNITY)
Admission: RE | Admit: 2022-05-05 | Discharge: 2022-05-05 | Disposition: A | Discharge status: Home / Self Care | Payer: Medicare Other | Source: Ambulatory Visit | Attending: Cardiology | Admitting: Cardiology

## 2022-05-05 ENCOUNTER — Encounter (HOSPITAL_COMMUNITY): Payer: Self-pay

## 2022-05-05 VITALS — BP 120/58 | HR 83 | Wt 229.0 lb

## 2022-05-05 DIAGNOSIS — E1151 Type 2 diabetes mellitus with diabetic peripheral angiopathy without gangrene: Secondary | ICD-10-CM | POA: Diagnosis not present

## 2022-05-05 DIAGNOSIS — I6529 Occlusion and stenosis of unspecified carotid artery: Secondary | ICD-10-CM | POA: Diagnosis not present

## 2022-05-05 DIAGNOSIS — F17200 Nicotine dependence, unspecified, uncomplicated: Secondary | ICD-10-CM | POA: Diagnosis not present

## 2022-05-05 DIAGNOSIS — I2510 Atherosclerotic heart disease of native coronary artery without angina pectoris: Secondary | ICD-10-CM | POA: Diagnosis not present

## 2022-05-05 DIAGNOSIS — J449 Chronic obstructive pulmonary disease, unspecified: Secondary | ICD-10-CM | POA: Insufficient documentation

## 2022-05-05 DIAGNOSIS — Z7984 Long term (current) use of oral hypoglycemic drugs: Secondary | ICD-10-CM | POA: Insufficient documentation

## 2022-05-05 DIAGNOSIS — E781 Pure hyperglyceridemia: Secondary | ICD-10-CM | POA: Diagnosis not present

## 2022-05-05 DIAGNOSIS — E11621 Type 2 diabetes mellitus with foot ulcer: Secondary | ICD-10-CM | POA: Diagnosis not present

## 2022-05-05 DIAGNOSIS — Z7901 Long term (current) use of anticoagulants: Secondary | ICD-10-CM | POA: Diagnosis not present

## 2022-05-05 DIAGNOSIS — I5042 Chronic combined systolic (congestive) and diastolic (congestive) heart failure: Secondary | ICD-10-CM | POA: Diagnosis not present

## 2022-05-05 DIAGNOSIS — E1122 Type 2 diabetes mellitus with diabetic chronic kidney disease: Secondary | ICD-10-CM | POA: Insufficient documentation

## 2022-05-05 DIAGNOSIS — Z79899 Other long term (current) drug therapy: Secondary | ICD-10-CM | POA: Diagnosis not present

## 2022-05-05 DIAGNOSIS — I480 Paroxysmal atrial fibrillation: Secondary | ICD-10-CM | POA: Insufficient documentation

## 2022-05-05 DIAGNOSIS — N1832 Chronic kidney disease, stage 3b: Secondary | ICD-10-CM | POA: Diagnosis not present

## 2022-05-05 DIAGNOSIS — Z79890 Hormone replacement therapy (postmenopausal): Secondary | ICD-10-CM | POA: Insufficient documentation

## 2022-05-05 DIAGNOSIS — E785 Hyperlipidemia, unspecified: Secondary | ICD-10-CM | POA: Insufficient documentation

## 2022-05-05 DIAGNOSIS — I130 Hypertensive heart and chronic kidney disease with heart failure and stage 1 through stage 4 chronic kidney disease, or unspecified chronic kidney disease: Secondary | ICD-10-CM | POA: Insufficient documentation

## 2022-05-05 DIAGNOSIS — K7460 Unspecified cirrhosis of liver: Secondary | ICD-10-CM | POA: Diagnosis not present

## 2022-05-05 DIAGNOSIS — L97519 Non-pressure chronic ulcer of other part of right foot with unspecified severity: Secondary | ICD-10-CM | POA: Insufficient documentation

## 2022-05-05 DIAGNOSIS — G4733 Obstructive sleep apnea (adult) (pediatric): Secondary | ICD-10-CM | POA: Diagnosis not present

## 2022-05-05 LAB — BASIC METABOLIC PANEL
Anion gap: 12 (ref 5–15)
BUN: 44 mg/dL — ABNORMAL HIGH (ref 6–20)
CO2: 28 mmol/L (ref 22–32)
Calcium: 8.8 mg/dL — ABNORMAL LOW (ref 8.9–10.3)
Chloride: 95 mmol/L — ABNORMAL LOW (ref 98–111)
Creatinine, Ser: 1.87 mg/dL — ABNORMAL HIGH (ref 0.44–1.00)
GFR, Estimated: 31 mL/min — ABNORMAL LOW (ref >60)
Glucose, Bld: 319 mg/dL — ABNORMAL HIGH (ref 70–99)
Potassium: 4.3 mmol/L (ref 3.5–5.1)
Sodium: 135 mmol/L (ref 135–145)

## 2022-05-05 LAB — BRAIN NATRIURETIC PEPTIDE: B Natriuretic Peptide: 187.8 pg/mL — ABNORMAL HIGH (ref 0.0–100.0)

## 2022-05-05 MED ORDER — TORSEMIDE 20 MG PO TABS: ORAL_TABLET | ORAL | 6 refills | Status: DC

## 2022-05-05 MED ORDER — SPIRONOLACTONE 25 MG PO TABS: 25.0000 mg | ORAL_TABLET | Freq: Every day | ORAL | 1 refills | Status: DC

## 2022-05-05 MED ORDER — ICOSAPENT ETHYL 1 G PO CAPS: 2.0000 g | ORAL_CAPSULE | Freq: Two times a day (BID) | ORAL | 6 refills | Status: DC

## 2022-05-05 NOTE — Progress Notes (Signed)
Patient ID: [name], female   DOB: May 16, 1961, 61 y.o.   MRN: [id number]    Advanced Heart Failure Clinic Note   PCP: [facility] PV: Dr. Gwenlyn Found HF Cardiology: Dr. Aundra Dubin  [name] is a 61 y.o. with history of PAD, carotid stenosis s/p left CEA, relatively mild CAD, chronic systolic CHF, paroxysmal atrial fibrillation and prior substance abuse presents for CHF clinic followup.  She was admitted in 1/17 with acute hypoxemic respiratory failure in the setting of atrial fibrillation/RVR and volume overload.  She was initially intubated.  She converted back to NSR with amiodarone gtt.  She was treated with IV Lasix, steroids, bronchodilators.  She developed AKI and losartan was stopped.    She was admitted in 3/17 with symptomatic bradycardia.  She was supposed to stop diltiazem after this admission but continued to take it.  She presented later in 3/17, again with symptomatic bradycardia (junctional bradycardia), hypotension, and AKI.  Diltiazem and bisoprolol were stopped.  HR recovered and she has had no problems since.  ACEI was stopped with AKI.   In 5/18, she had peripheral angiography with atherectomy of right SFA.  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 7/18 ABIs showed 0.65 on right, 0.31 on left.  In 8/20, she had peripheral angiography showing totally occluded left SFA.  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 Donnetta Hutching.  ABIs in 2/21 were 0.44 on right, 0.99 on left.   Echo in 1/21 showed EF 50%, mild LVH, normal RV, PASP 60 mmHg.   Echo 03/08/21 EF 55-60% with basal to mid inferolateral hypokinesis.   Admitted 6/22 for mechanical fall, found to have AKI on CKD. She was given IV lasix and her losartan was held.  Underwent PV angiogram with Dr. Gwenlyn Found 7/22. She had drug-coated balloon angioplasty of the right SFA after being admitted for critical ischemia of the right foot.  Repeat peripheral  arterial dopplers in 8/22 still showed severe right SFA disease.   Admitted 8/22 for bradycardia and AKI, bisoprolol subsequently stopped; amiodarone was continued.  Seen in ED 9/22 for N/V w/ black tarry stools after taking Pepto-Bismal, losartan stopped for unclear reasons.  Last seen by Dr. Aundra Dubin 5/22. Wt was up 4 lb. ReDs 32%. NYHA class III. BNP was 81. BMP showed AKI w/ Scr elevated at 2.2. She was instructed to reduce torsemide to 60 mg bid x 2 days, then 80 mg qam/ 60 mg qpm thereafter. Lipid panel also showed elevated TG, 405. Vascepa 2 mg bid prescribed. Instructed to return back in 4 wks.   Since that appt, she was hospitalized from 6/4-6/9 for viral gastroenteritis/ sepsis. Per D/c summary, she remained stable from HF standpoint. Her meds were temporarily held but ultimately restarted prior to d/c, w/o change to prior regimen.   Presents today for f/u. Here w/ paramedicine [name]). Wt is up 7 lb since last visit. 1+ b/l LEE on exam. Breathing stable. Denies any increase in baseline dyspnea. No resting symptoms. Denies orthopnea/PND.  Reports full compliance w/ diuretics and good UOP. Denies dietary indiscretion w/ sodium but ? Truthfulness. Has not been sending CardioMEMs transmissions regularly. She did send transmission today, per her report, however data poral sight is down. Unable to review report.   BP is controlled.   States Rx for Vascepa was never sent to pharmacy.    Cardiomems: Unable to generate report today (website is down)  Labs (1/17): K 5.2, creatinine 1.4, AST 43, ALT normal Labs (2/17): K 4.2, creatinine 1.3, BNP 607, TSH normal Labs (3/17): K 4.2, creatinine 1.45, AST/ALT normal, HCT 28.7 Labs (4/17): K 4.4, creatinine 1.61 Labs (08/31/2016): K 4.5 Creatinine 1.43  Labs (11/17): K 4.2, creatinine 1.4 Labs (9/18): K 4.1, creatinine 1.1, TSH normal Labs 04/13/2018: K 5 Creatinine 1.75 Labs (2/21): K 4.1, creatinine 1.58 Labs (4/22): K 3.7, creatinine  1.55 Labs (5/22): K 3.8, creatinine 1.54 Labs (8/22): K 4.0, creatinine 2.07 Labs (11/22): TSH normal, LDL 59 Labs (12/22): K 3.6, creatinine 1.28, BNP 256, AST/ALT normal Labs (3/23): K 4.3, creatinine 1.82 => 1.7, hgb 11 Labs (5/22) K 3.6, creatinine 2.2, TG 405  Labs (6/22) K 3.6, creatinine 1.34   PMH: 1. Carotid stenosis: Known occluded right carotid.  Left CEA in 4/16.  - Carotid dopplers (7/21): CTO RICA, mild disease LICA.  - Carotid dopplers (7/22): CTO RICA, 9-23% LICA.  2. CAD: LHC in 12/15 with 80% stenosis in small OM1, nonobstructive disease in other territories.  3. Chronic systolic CHF: Nonischemic cardiomyopathy (?due to ETOH or prior drug abuse).  She has a Cardiomems.  - Echo (1/17) with EF 45%, mild LV hypertrophy, moderate diastolic dysfunction, inferolateral severe hypokinesis, mildly decreased RV systolic function.  - Echo (1/18): EF 40-45%, mild LVH, normal RV size and systolic function.  - Echo (1/21): EF 50%, mild LVH, normal RV, PASP 60 mmHg.  - Echo (4/22): EF 55-60%, basal-mid inferolateral hypokinesis with grade II diastolic dysfunction, RV normal, PASP 42 4. Atrial fibrillation: Paroxysmal.   5. Type II diabetes 6. CKD: Stage III. 7. COPD: Smokes 1 ppd.  8. 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  No longer drinks.  9. Hypothyroidism 10. PAD: Atherectomy SFA in 2014 (Dr Gwenlyn Found).  Peripheral arterial dopplers (2/17) with focal 75-99% proximal right SFA stenosis, occluded mid-distal right SFA, chronic occlusion of all runoff arteries on right. - In 5/18, she had peripheral angiography with atherectomy of right SFA.   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  - 7/18 ABIs showed 0.65 on right, 0.31 on left.   - Peripheral angiography 8/20: Totally occluded left SFA.  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 Donnetta Hutching.   - ABIs (2/21): 0.44 R, 0.99 L.  - Balloon angioplasty right SFA 7/22.   - ABIs (8/22): severe right SFA disease.  11. Anemia 12. Prior cocaine abuse.  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  14. OSA: Has not been able to tolerate CPAP.  15. Diabetic neuropathy.   SH: Lives with sister.  Prior cocaine abuse.  Prior ETOH abuse.  Quit smoking in 1/17, restarted, and quit again in 4/19, restarted again.  FH: CAD  Review of systems complete and found to be negative unless listed in HPI.    Current Outpatient Medications  Medication Sig Dispense Refill   ACCU-CHEK GUIDE test strip USE TO CHECK BLOOD SUGAR FOUR TIMES DAILY     acetaminophen (TYLENOL) 500 MG tablet Take 1,000 mg by mouth every 6 (six) hours as needed for headache (pain).     albuterol (VENTOLIN HFA) 108 (90 Base) MCG/ACT inhaler Inhale 2 puffs into the lungs every 6 (six) hours as needed for wheezing or shortness of breath. 18 g 3   allopurinol (ZYLOPRIM) 100 MG tablet TAKE 1 TABLET (100 MG TOTAL) BY MOUTH DAILY(AM) (Patient taking differently: Take 100 mg  by mouth every morning.) 30 tablet 0   amiodarone (PACERONE) 200 MG tablet TAKE 1/2 TABLET (100 MG TOTAL) BY MOUTH DAILY (AM) (Patient taking differently: Take 100 mg by mouth every morning.) 15 tablet 3   amLODipine (NORVASC) 10 MG tablet Take 1 tablet (10 mg total) by mouth daily. (Patient taking differently: Take 10 mg by mouth every morning.) 90 tablet 3   apixaban (ELIQUIS) 5 MG TABS tablet Take 1 tablet (5 mg total) by mouth 2 (two) times daily. 60 tablet 8   Ascorbic Acid (VITAMIN C PO) Take 1 tablet by mouth every morning.     Aspirin-Salicylamide-Caffeine (BC HEADACHE POWDER PO) Take 1 packet by mouth 4 (four) times daily as needed (pain/headache).     atorvastatin (LIPITOR) 40 MG tablet TAKE 1 TABLET (40 MG TOTAL) BY MOUTH EVERY EVENING (BEDTIME) (Patient taking differently: Take 40 mg by mouth at bedtime.) 30 tablet 0   budesonide-formoterol (SYMBICORT) 160-4.5 MCG/ACT inhaler Inhale 2 puffs into the lungs  2 (two) times daily. 1 Inhaler 2   buPROPion (WELLBUTRIN SR) 150 MG 12 hr tablet Take 150 mg by mouth 2 (two) times daily.     Cholecalciferol (VITAMIN D3 PO) Take 1 capsule by mouth every morning.     Continuous Blood Gluc Sensor (FREESTYLE LIBRE 2 SENSOR) MISC USE 1 (ONE) EACH EVERY 2 WEEKS 2 each 11   doxycycline (VIBRA-TABS) 100 MG tablet Take 1 tablet (100 mg total) by mouth 2 (two) times daily for 10 days. 20 tablet 0   Dulaglutide (TRULICITY) 9.47 SJ/6.2EZ SOPN Inject 0.75 mg into the skin every Friday.     ferrous sulfate (FEROSUL) 325 (65 FE) MG tablet TAKE 1 TABLET (324 MG TOTAL) BY MOUTH 2 (TWO) TIMES DAILY AS NEEDED. (AM+BEDTIME) (Patient taking differently: 325 mg 2 (two) times daily.) 60 tablet 0   FLUoxetine (PROZAC) 20 MG capsule TAKE 1 CAPSULE (20 MG TOTAL) BY MOUTH AT BEDTIME. 30 capsule 0   folic acid (FOLVITE) 1 MG tablet TAKE 1 TABLET (1 MG TOTAL) BY MOUTH DAILY. (Patient taking differently: Take 1 mg by mouth every morning.) 90 tablet 1   hydrALAZINE (APRESOLINE) 50 MG tablet Take 1.5 tablets (75 mg total) by mouth 3 (three) times daily. 140 tablet 4   insulin glargine (LANTUS) 100 UNIT/ML Solostar Pen Inject 30 Units into the skin daily. (Patient taking differently: Inject 38 Units into the skin at bedtime.) 30 mL 6   insulin lispro (HUMALOG) 100 UNIT/ML KwikPen Inject 8 Units into the skin 3 (three) times daily with meals. 15 mL 0   Insulin Pen Needle (EASY COMFORT PEN NEEDLES) 31G X 5 MM MISC USE FOUR TIMES A DAY FOR INSULIN ADMINISTRATION 100 each 0   JARDIANCE 10 MG TABS tablet TAKE 1 TABLET (10 MG TOTAL) BY MOUTH DAILY BEFORE BREAKFAST (AM) (Patient taking differently: 10 mg every morning.) 90 tablet 3   levothyroxine (SYNTHROID) 50 MCG tablet TAKE 1 TABLET (50 MCG TOTAL) BY MOUTH DAILY (AM) (Patient taking differently: Take 50 mcg by mouth every morning.) 30 tablet 0   losartan (COZAAR) 25 MG tablet TAKE 2 TABLETS (50 MG TOTAL) BY MOUTH DAILY (AM) (Patient taking  differently: Take 50 mg by mouth every morning.) 60 tablet 0   Omega-3 Fatty Acids (FISH OIL PO) Take 1 capsule by mouth every morning.     ondansetron (ZOFRAN) 4 MG tablet Take 1 tablet (4 mg total) by mouth every 8 (eight) hours as needed for nausea or vomiting. 20 tablet 0  pantoprazole (PROTONIX) 40 MG tablet TAKE 1 TABLET (40 MG TOTAL) BY MOUTH DAILY(AM) (Patient taking differently: Take 40 mg by mouth every morning.) 30 tablet 0   spironolactone (ALDACTONE) 25 MG tablet Take 0.5 tablets (12.5 mg total) by mouth daily. 45 tablet 3   torsemide (DEMADEX) 20 MG tablet Take 80 mg in am and 60 mg in pm (Patient taking differently: Take 60-80 mg by mouth See admin instructions. Take 4 tablets (80 mg) by mouth every morning and 3 tablets (60 mg) at noon) 210 tablet 4   No current facility-administered medications for this encounter.   BP (!) 120/58   Pulse 83   Wt 103.9 kg (229 lb)   LMP  (LMP Unknown)   SpO2 97%   BMI 39.31 kg/m   Wt Readings from Last 3 Encounters:  05/05/22 103.9 kg (229 lb)  04/22/22 103 kg (227 lb 1.2 oz)  04/04/22 101.1 kg (222 lb 12.8 oz)    PHYSICAL EXAM: General:  chronically ill appearing, looks older than actual age, in [facility]. No respiratory difficulty HEENT: normal Neck: supple. JVD 10 cm Carotids 2+ bilat; no bruits. No lymphadenopathy or thyromegaly appreciated. Cor: PMI nondisplaced. Regular rate & rhythm. No rubs, gallops or murmurs. Lungs: clear Abdomen: obese, soft, nontender, nondistended. No hepatosplenomegaly. No bruits or masses. Good bowel sounds. Extremities: no cyanosis, clubbing, rash, 1-2+ b/l tense LE edema Neuro: alert & oriented x 3, cranial nerves grossly intact. moves all 4 extremities w/o difficulty. Affect pleasant.   Assessment/Plan: 1. Chronic diastolic CHF: LHC in 03/00 showing only 80% stenosis in small OM1. EF as low as 40-45% in the past. Echo 4/22 showed EF 55-60% with grade 2 diastolic dysfunction.   Not very active, NYHA class  III symptoms. Volume overloaded on exam. Wt up 7 lb. Suspect dietary indiscretion  - Increase torsemide to 100 qam/ 80 mg qpm - Increase spironolactone to 25 mg daily  - Continue losartan 50 mg daily.  - Continue Jardiance 10 mg daily. No GU symptoms. - Off bisoprolol due to bradycardia.   - check BMP and BNP today  - instructed to improve compliance w/ Cardiomems  2. Atrial fibrillation: Paroxysmal. RRR on exam today  - Continue amiodarone 100 mg daily.  LFTs/ TFTs checked last visit and normal.  She is on levothyroxine, followed by PCP.  Needs regular eye exam,  - Continue Eliquis 5 mg bid.   3. PAD: Claudication bilaterally.  Not candidate for cilostazol with CHF.  She has had a left fem-pop bypass in 8/20.  ABIs in 2/21 were very abnormal on right. She underwent PV angiogram with balloon angioplasty of the right SFA 7/22.  8/22 dopplers still showed severe right SFA disease and she had a right foot ulceration and was being seen in [facility]. Dopplers 2/23 showed patent right SFA.  She has been having increased leg pain recently, also with ulcer on right foot. Leg pain probably not all claudication, also has diabetic neuropathy.  - She is followed by Dr. Gwenlyn Found, think she needs a followup with him. Advised to arrange f/u.  - Continue atorvastatin.  - She is now off Plavix. 4. COPD:  Congratulated on quitting smoking.  5. H/o cirrhosis: Likely from ETOH, she has quit.  6. OSA: Not able to tolerate CPAP.   7. CKD stage III:  B/l SCr ~1.5 - check BMP today   8. HTN: Controlled.   - increasing spiro to 25 mg once daily per above  9.Type 2 diabetes:  Per PCP.   10. CAD: LHC in 12/15 with 80% stenosis in small OM1, nonobstructive disease in other territories. No chest pain. - Continue statin.  - Continue Eliquis. 11. Carotid stenosis: Due for carotid dopplers in 7/23.  12. HLD: on atorvastatin. Recent LP 4/23, unable to calculate LDL given elevated TG, 405 - needs to start Vascepa, will  send Rx to pharmacy 2 g bid - plan repeat LP in 4-6 wks   Followup 1 month with APP.   Lyda Jester, PA-C 05/05/2022

## 2022-05-05 NOTE — Patient Instructions (Signed)
Thank you for coming in today  Labs were done today, if any labs are abnormal the clinic will call you No news is good news  INCREASE Spironolactone 25 mg daily  INCREASE Torsemide 100 mg in morning, 80 mg in the evening  Your physician recommends that you schedule a follow-up appointment in:  3-4 week in clinic  At the [facility], you and your health needs are our priority. As part of our continuing mission to provide you with exceptional heart care, we have created designated Provider Care Teams. These Care Teams include your primary Cardiologist (physician) and Advanced Practice Providers (APPs- Physician Assistants and Nurse Practitioners) who all work together to provide you with the care you need, when you need it.   You may see any of the following providers on your designated Care Team at your next follow up: Dr Glori Bickers Dr Haynes Kerns, NP Lyda Jester, [place] [facility] [place], [place] Audry Riles, PharmD   Please be sure to bring in all your medications bottles to every appointment.   If you have any questions or concerns before your next appointment please send [place] a message through [place] or call our office at [phone].    TO LEAVE A MESSAGE FOR THE NURSE SELECT OPTION 2, PLEASE LEAVE A MESSAGE INCLUDING: YOUR NAME DATE OF BIRTH CALL BACK NUMBER REASON FOR CALL**this is important as we prioritize the call backs  YOU WILL RECEIVE A CALL BACK THE SAME DAY AS LONG AS YOU CALL BEFORE 4:00 PM

## 2022-05-05 NOTE — Telephone Encounter (Signed)
Attempted to contact pt post hosp d/c for home visit.  No answer.   Unable to LVM.   Marylouise Stacks, EMT-Paramedic  05/04/2022

## 2022-05-05 NOTE — Progress Notes (Signed)
Paramedicine Encounter   Patient ID: [name] , female,   DOB: February 11, 1961,60 y.o.,  MRN: [id number]  Pt reports she has had a fall since she has been home.  She has 1 week of meds left in her bubble packs.  She reports her phone ringer stays  Met patient in clinic today with provider.   Weight @ clinic-229  Weight @ last clinic-222  B/P-122/58 P-83 SP02-97 CBG PTA-246  Med changes-spiro to full tablet  Increase torsemide to 144m in AM and evening 815m  Labs done today.   She was suppose to have been started on vascepa due to high triglycerides-it has not been on her list of meds.   She needs to f/u with dr berry about her circulation.  Pharmacy got rx for potassium for 2068mwill check on labs from today.     KatMarylouise StacksMTCascade22/2023

## 2022-05-05 NOTE — Progress Notes (Signed)
Came out today for med rec.   [name] was increased to a full tablet and increase torsemide to 100/80 Pharmacy said the spiro was sent out in a separate bottle but pt does not remember getting it so I cant confirm she has been taking the 1/2 tablet.  Sent message to clinic ref this.  Will get list done for pharmacy so they can updated list to get her bubble packs done early next week.  She has enough meds thru Wednesday minus the spiro.   Repeat BMP in 1 week- will have to sch once we get spiro dose confirmed.   Marylouise Stacks, [place] 05/05/2022

## 2022-05-06 ENCOUNTER — Telehealth (HOSPITAL_COMMUNITY): Payer: Self-pay | Admitting: Cardiology

## 2022-05-06 MED ORDER — SPIRONOLACTONE 25 MG PO TABS: 12.5000 mg | ORAL_TABLET | Freq: Every day | ORAL | 1 refills | Status: DC

## 2022-05-06 NOTE — Telephone Encounter (Signed)
Noted  Medication list updated Will need to  keep lab appt since restarting spiro

## 2022-05-09 ENCOUNTER — Other Ambulatory Visit (HOSPITAL_COMMUNITY): Payer: Self-pay | Admitting: Cardiology

## 2022-05-09 ENCOUNTER — Telehealth (HOSPITAL_COMMUNITY): Payer: Self-pay

## 2022-05-09 ENCOUNTER — Other Ambulatory Visit: Payer: Self-pay | Admitting: Family Medicine

## 2022-05-09 ENCOUNTER — Other Ambulatory Visit (HOSPITAL_COMMUNITY): Payer: Self-pay

## 2022-05-09 DIAGNOSIS — F419 Anxiety disorder, unspecified: Secondary | ICD-10-CM

## 2022-05-10 ENCOUNTER — Other Ambulatory Visit: Payer: Self-pay | Admitting: Family Medicine

## 2022-05-10 DIAGNOSIS — E039 Hypothyroidism, unspecified: Secondary | ICD-10-CM

## 2022-05-10 MED ORDER — LOSARTAN POTASSIUM 25 MG PO TABS: 50.0000 mg | ORAL_TABLET | Freq: Every day | ORAL | 0 refills | Status: DC

## 2022-05-10 MED ORDER — LEVOTHYROXINE SODIUM 50 MCG PO TABS: 50.0000 ug | ORAL_TABLET | Freq: Every day | ORAL | 0 refills | Status: DC

## 2022-05-10 MED ORDER — ATORVASTATIN CALCIUM 40 MG PO TABS: 40.0000 mg | ORAL_TABLET | Freq: Every day | ORAL | 0 refills | Status: DC

## 2022-05-10 NOTE — Telephone Encounter (Signed)
Requested medication (s) are due for refill today: yes  Requested medication (s) are on the active medication list: yes  Last refill:  Zyloprim 04/07/22 #30 with 0 RF, Prozac 03/14/22 #30 with 0 RF  Future visit scheduled: 06/02/22 with Georgian Co, PA, last visit 11/16/21  Notes to clinic:  The curtesy refill already given said pt must have appt with Dr. Alvis Lemmings prior to further refills. I was not sure if the upcoming appt with Marylene Land would qualify for refill, please assess.      Requested Prescriptions  Pending Prescriptions Disp Refills   allopurinol (ZYLOPRIM) 100 MG tablet [Pharmacy Med Name: ALLOPURINOL 100 MG ORAL TABLET] 30 tablet 0    Sig: TAKE 1 TABLET (100 MG TOTAL) BY MOUTH DAILY(AM)     Endocrinology:  Gout Agents - allopurinol Failed - 05/09/2022 10:38 AM      Failed - Uric Acid in normal range and within 360 days    Uric Acid  Date Value Ref Range Status  08/11/2021 10.3 (H) 3.0 - 7.2 mg/dL Final    Comment:               Therapeutic target for gout patients: <6.0         Failed - Cr in normal range and within 360 days    Creat  Date Value Ref Range Status  01/25/2016 1.09 (H) 0.50 - 1.05 mg/dL Final   Creatinine, Ser  Date Value Ref Range Status  05/05/2022 1.87 (H) 0.44 - 1.00 mg/dL Final   Creatinine, Urine  Date Value Ref Range Status  08/18/2018 72.60 mg/dL Final    Comment:    Performed at [facility], [address] [place], [place] [address]         Passed - Valid encounter within last 12 months    Recent Outpatient Visits           5 months ago DM (diabetes mellitus), type 2 with peripheral vascular complications ([name])   [place] [facility] [place], [place], MD   7 months ago DM (diabetes mellitus), type 2 with peripheral vascular complications [facility])   [place] [facility] [place], Marylene Land M, [place]   9 months ago DM (diabetes mellitus), type 2 with peripheral vascular complications [facility])   [facility] [place], [place], MD   10 months ago DM (diabetes mellitus), type 2 with peripheral vascular complications [facility])   [place] [facility] [place], Odette Horns, MD   6 years ago Type 2 diabetes mellitus with hyperglycemia   [facility] [place], Odette Horns, MD       Future Appointments             In 3 weeks [place], Marzella Schlein, PA-C [facility]            Passed - CBC within normal limits and completed in the last 12 months    WBC  Date Value Ref Range Status  04/21/2022 8.6 4.0 - 10.5 K/uL Final   RBC  Date Value Ref Range Status  04/21/2022 3.58 (L) 3.87 - 5.11 MIL/uL Final   Hemoglobin  Date Value Ref Range Status  04/21/2022 11.0 (L) 12.0 - 15.0 g/dL Final  78/29/5621 30.8 (L) 11.1 - 15.9 g/dL Final   Total hemoglobin  Date Value Ref Range Status  12/03/2015 9.6 (L) 12.0 - 16.0 g/dL Final   HCT  Date Value Ref Range  Status  04/21/2022 33.8 (L) 36.0 - 46.0 % Final   Hematocrit  Date Value Ref Range Status  05/25/2021 35.5 34.0 - 46.6 % Final   MCHC  Date Value Ref Range Status  04/21/2022 32.5 30.0 - 36.0 g/dL Final   [facility]  Date Value Ref Range Status  04/21/2022 30.7 26.0 - 34.0 pg Final   MCV  Date Value Ref Range Status  04/21/2022 94.4 80.0 - 100.0 fL Final  05/25/2021 88 79 - 97 fL Final   No results found for: "PLTCOUNTKUC", "LABPLAT", "POCPLA" RDW  Date Value Ref Range Status  04/21/2022 13.3 11.5 - 15.5 % Final  05/25/2021 14.1 11.7 - 15.4 % Final          FLUoxetine (PROZAC) 20 MG capsule [Pharmacy Med Name: FLUOXETINE HCL 20 MG ORAL CAPSULE] 30 capsule 0    Sig: TAKE 1 CAPSULE (20 MG TOTAL) BY MOUTH AT BEDTIME.     Psychiatry:  Antidepressants - SSRI Failed - 05/09/2022 10:38 AM      Failed - Completed PHQ-2 or PHQ-9 in the last 360 days      Passed - Valid encounter within last 6 months    Recent Outpatient Visits           5  months ago DM (diabetes mellitus), type 2 with peripheral vascular complications ([name])   [place] [facility] [place], [place], MD   7 months ago DM (diabetes mellitus), type 2 with peripheral vascular complications [facility])   [place] [facility] [place], [place], [place]   9 months ago DM (diabetes mellitus), type 2 with peripheral vascular complications [facility])   [place] [facility] [place], [place], [place]   10 months ago DM (diabetes mellitus), type 2 with peripheral vascular complications [place] [facility])   [place] [facility] [place], Odette Horns, MD   6 years ago Type 2 diabetes mellitus with hyperglycemia    [facility] [name], MD       Future Appointments             In 3 weeks [place], Marzella Schlein, PA-C [facility]

## 2022-05-12 ENCOUNTER — Ambulatory Visit (HOSPITAL_COMMUNITY)
Admission: RE | Admit: 2022-05-12 | Discharge: 2022-05-12 | Disposition: A | Discharge status: Home / Self Care | Payer: Medicare Other | Source: Ambulatory Visit | Attending: Internal Medicine | Admitting: Internal Medicine

## 2022-05-12 ENCOUNTER — Telehealth (HOSPITAL_COMMUNITY): Payer: Self-pay

## 2022-05-12 DIAGNOSIS — R748 Abnormal levels of other serum enzymes: Secondary | ICD-10-CM

## 2022-05-12 DIAGNOSIS — I5042 Chronic combined systolic (congestive) and diastolic (congestive) heart failure: Secondary | ICD-10-CM

## 2022-05-12 LAB — BASIC METABOLIC PANEL
Anion gap: 12 (ref 5–15)
BUN: 46 mg/dL — ABNORMAL HIGH (ref 6–20)
CO2: 28 mmol/L (ref 22–32)
Calcium: 8.9 mg/dL (ref 8.9–10.3)
Chloride: 98 mmol/L (ref 98–111)
Creatinine, Ser: 1.59 mg/dL — ABNORMAL HIGH (ref 0.44–1.00)
GFR, Estimated: 37 mL/min — ABNORMAL LOW (ref >60)
Glucose, Bld: 270 mg/dL — ABNORMAL HIGH (ref 70–99)
Potassium: 4 mmol/L (ref 3.5–5.1)
Sodium: 138 mmol/L (ref 135–145)

## 2022-05-12 NOTE — Telephone Encounter (Signed)
[place]ly[place] to request the following for refills:  Her bubble packs-no med changes from labs today-per dr Aundra Dubin they are ok-requested to pharmacy to fill her other 3 wks of bubble packs.     Marylouise Stacks, [place] 05/12/22,

## 2022-05-28 IMAGING — CR DG TIBIA/FIBULA 2V*L*
2 series · 2 of 2 positions shown · non-contrast
Comparison: None.

CLINICAL DATA: Status post fall, pain

EXAM:
LEFT TIBIA AND FIBULA - 2 VIEW

[x tib-fib ap left]
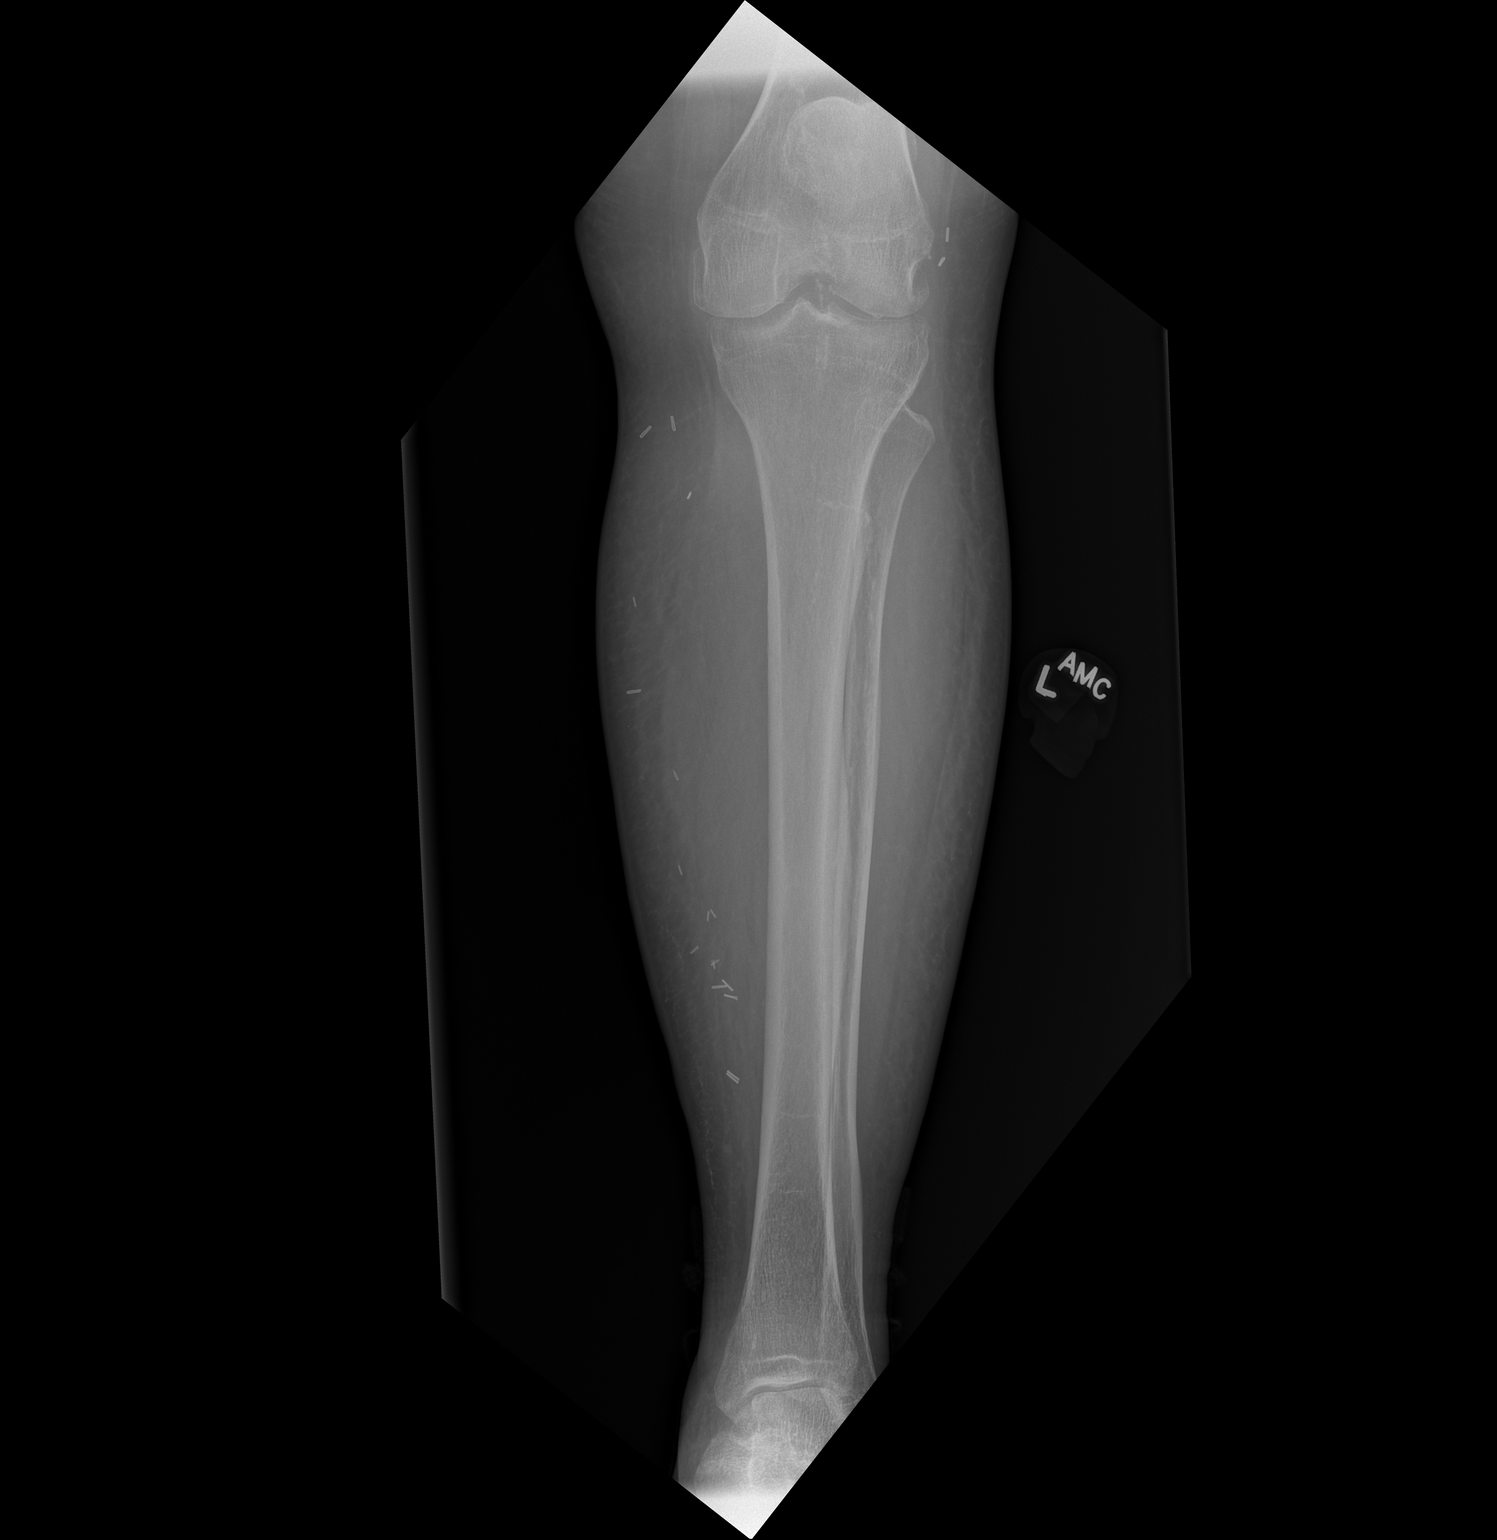

[x tib-fib lat left]
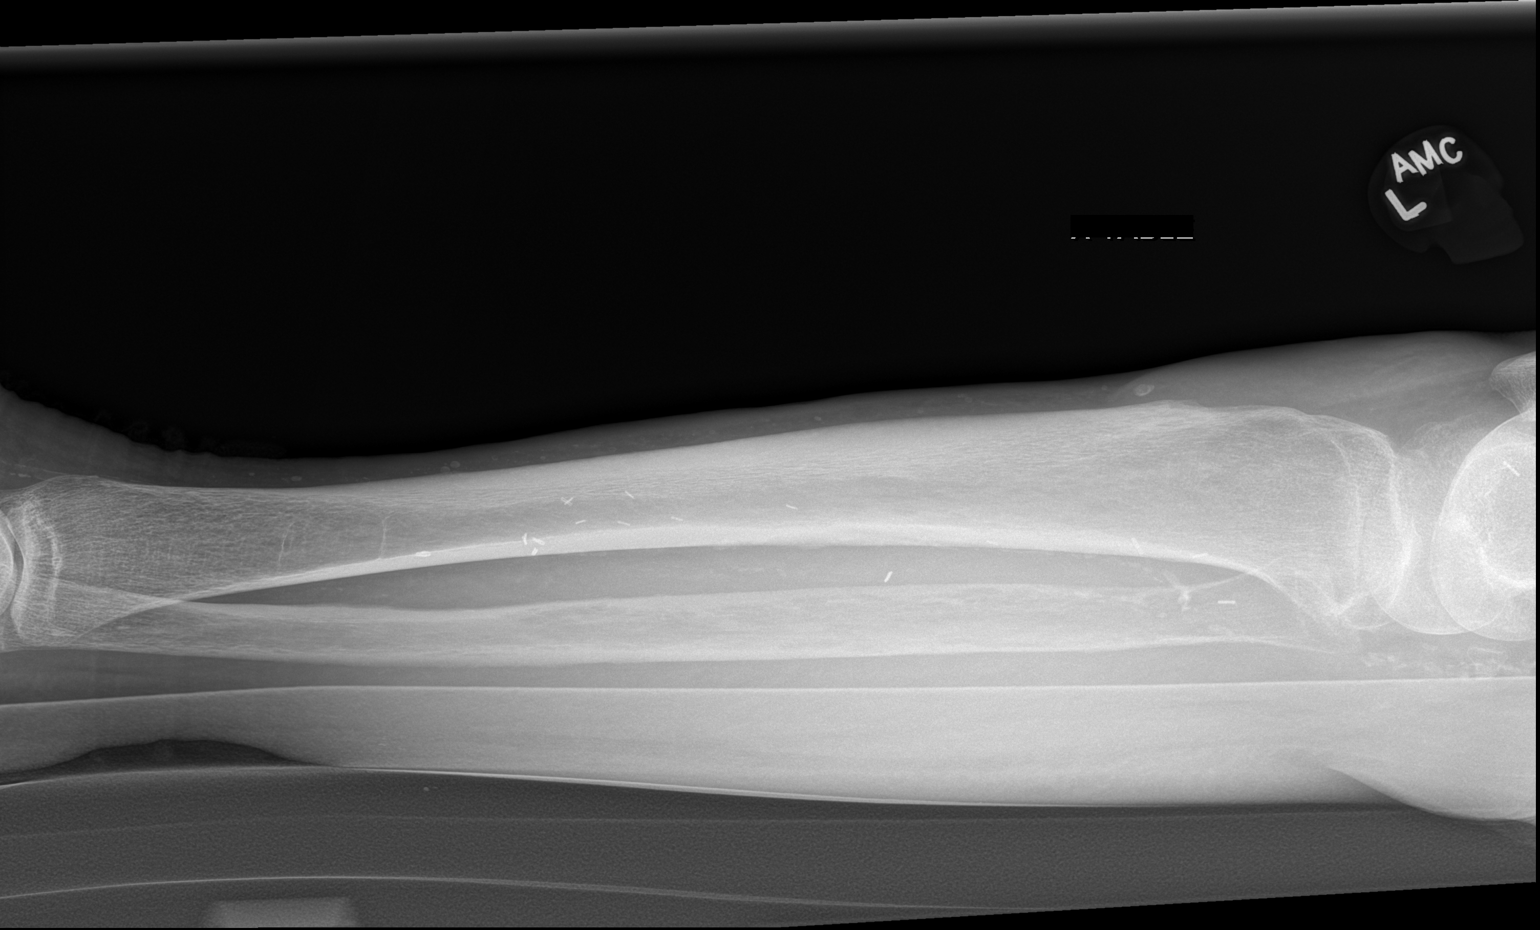

[2 of 2 positions shown; findings below may reference images not displayed]

FINDINGS: No acute fracture or dislocation. No aggressive osseous lesion.
Normal alignment. Generalized osteopenia. Mild medial femorotibial
compartment osteoarthritis. No joint effusion.

Soft tissue are unremarkable. No radiopaque foreign body or soft
tissue emphysema. Multiple surgical staples in the medial soft
tissues. Peripheral vascular atherosclerotic disease.
IMPRESSION: No acute osseous injury of the left tibia and fibula.

## 2022-05-28 IMAGING — CR DG SHOULDER 2+V*R*
3 series · 3 of 3 positions shown · non-contrast
Comparison: 02/09/2021

CLINICAL DATA: Trauma, fall

EXAM:
RIGHT SHOULDER - 2+ VIEW

[x shoulder ap right (1 of 3)]
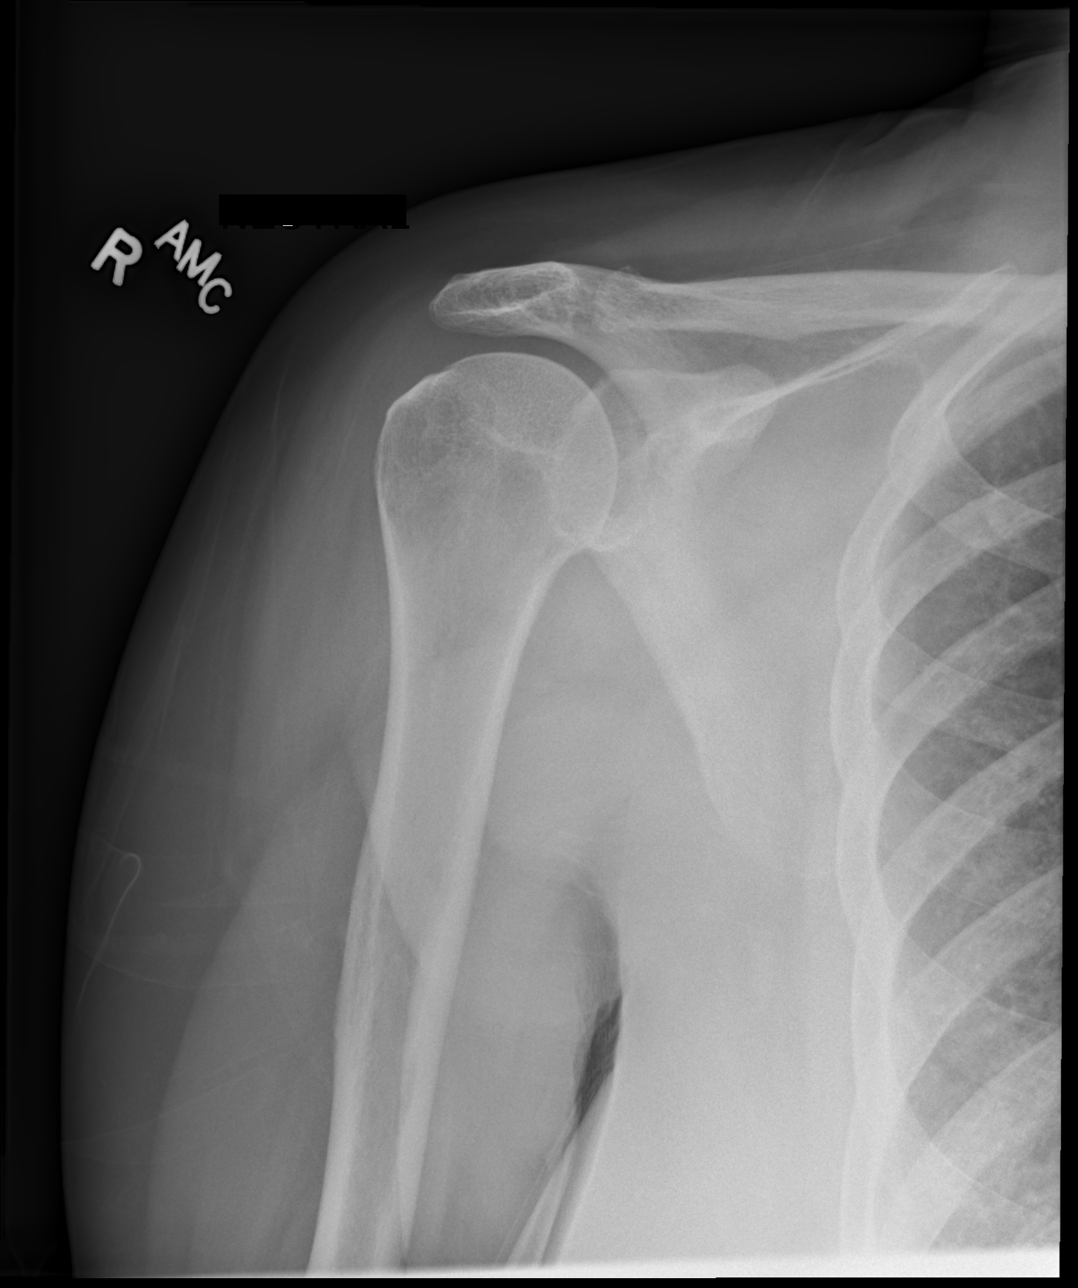

[x shoulder ap right (2 of 3)]
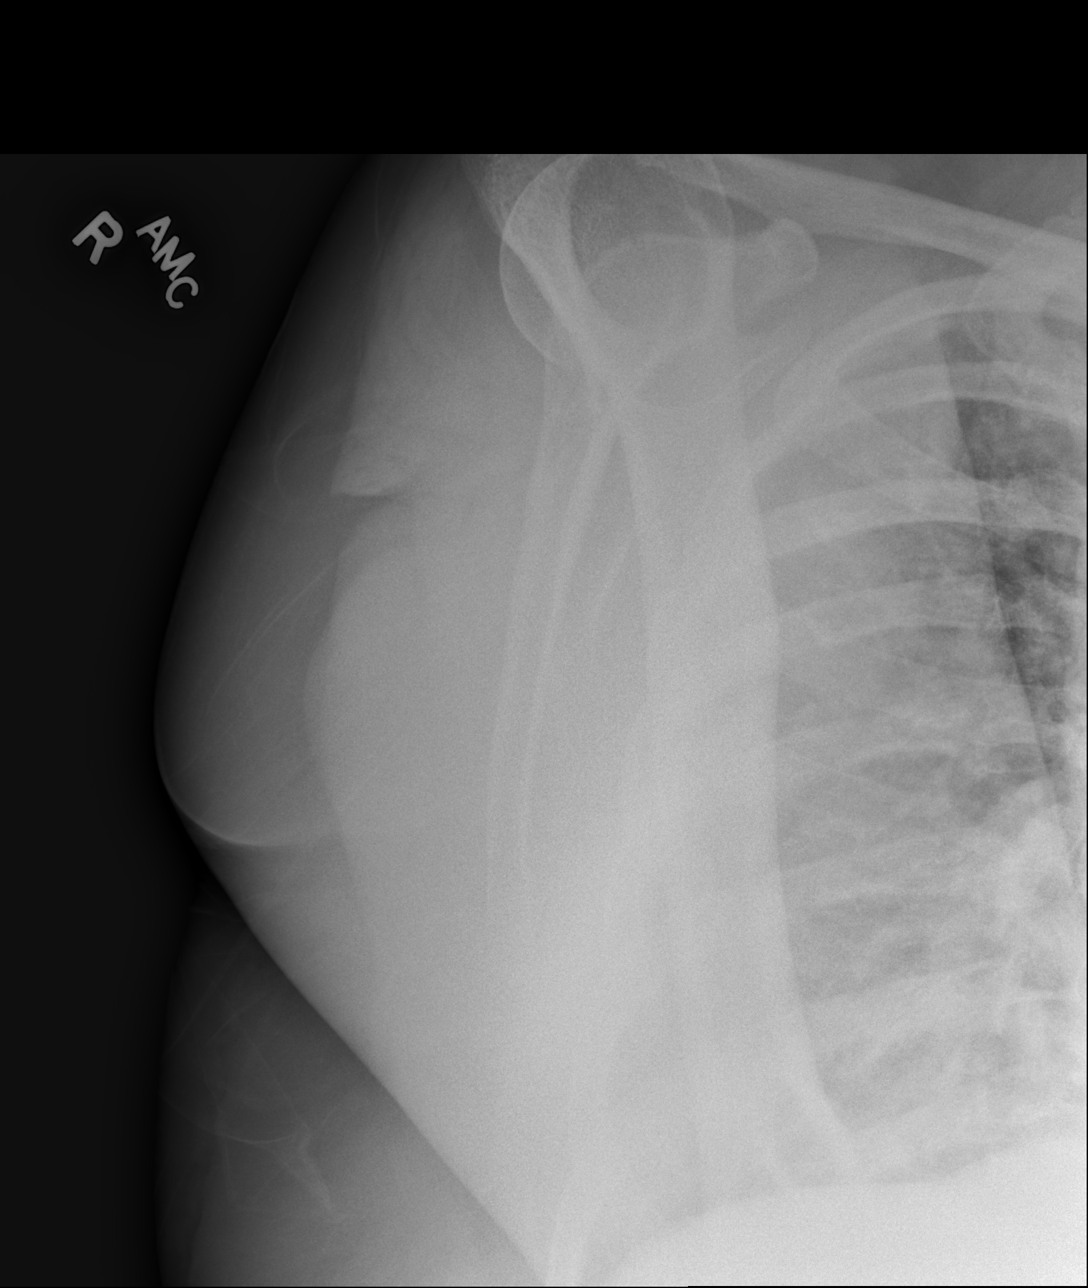

[x shoulder ap right (3 of 3)]
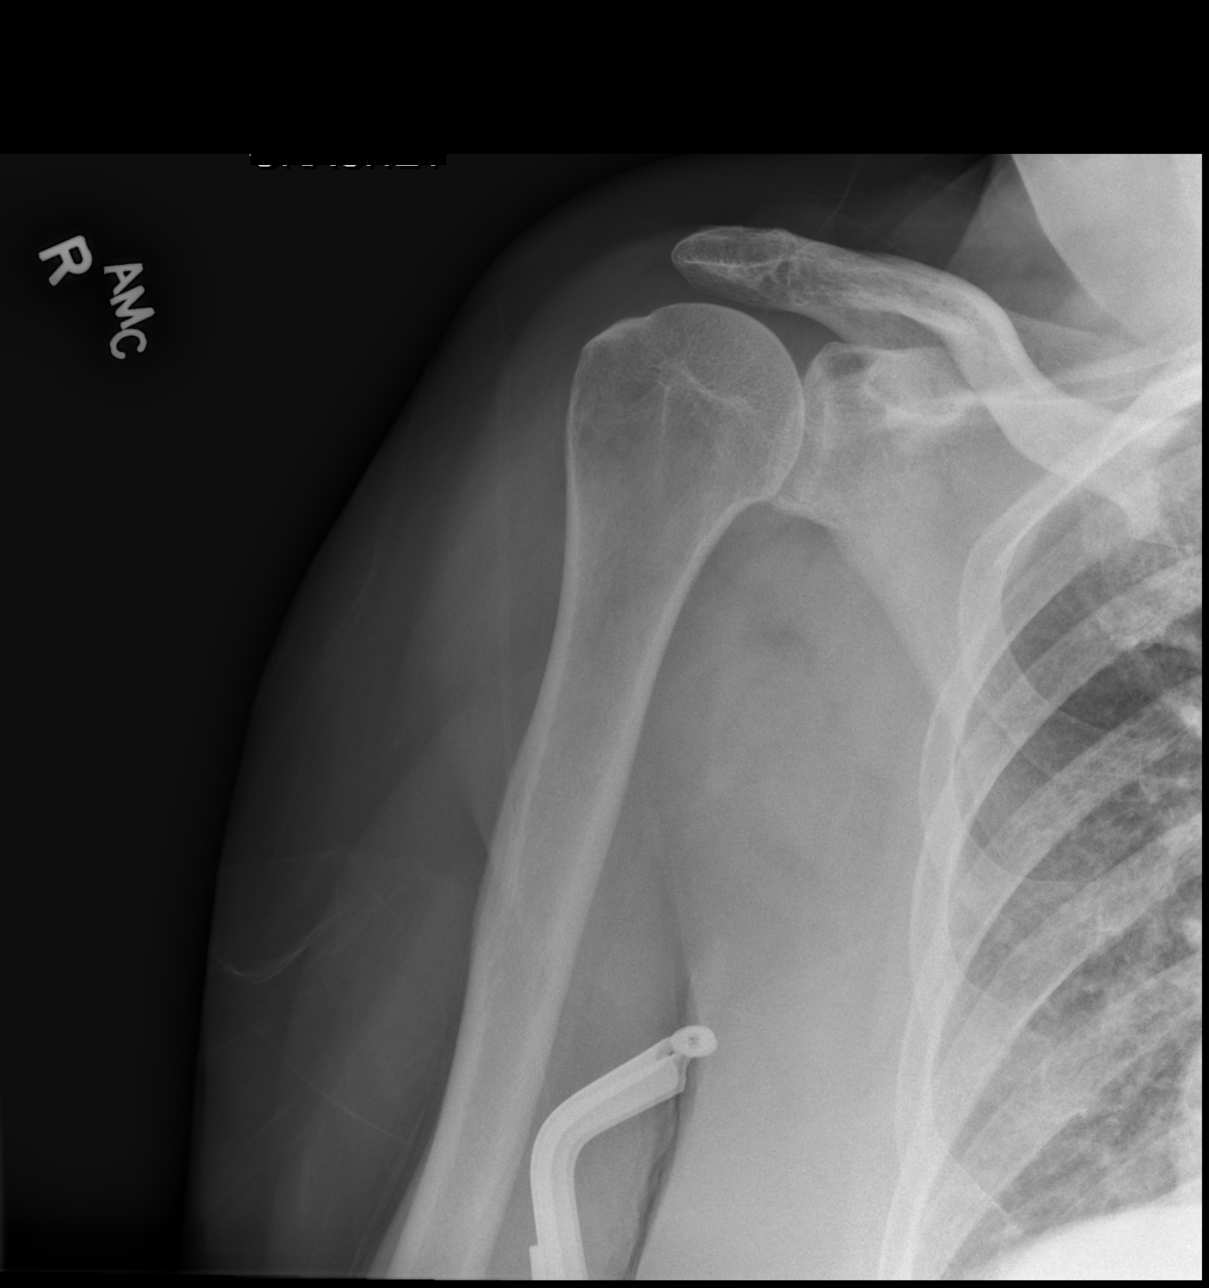

[3 of 3 positions shown; findings below may reference images not displayed]

FINDINGS: No fracture or dislocation is seen. There are no abnormal soft
tissue calcifications. No significant interval changes are noted.
IMPRESSION: No recent fracture or dislocation is seen in right shoulder.

## 2022-05-28 IMAGING — CR DG KNEE COMPLETE 4+V*L*
4 series · 4 of 4 positions shown · non-contrast
Comparison: None.

CLINICAL DATA: Status post fall, pain

EXAM:
LEFT KNEE - COMPLETE 4+ VIEW

[x knee ap left (1 of 3)]
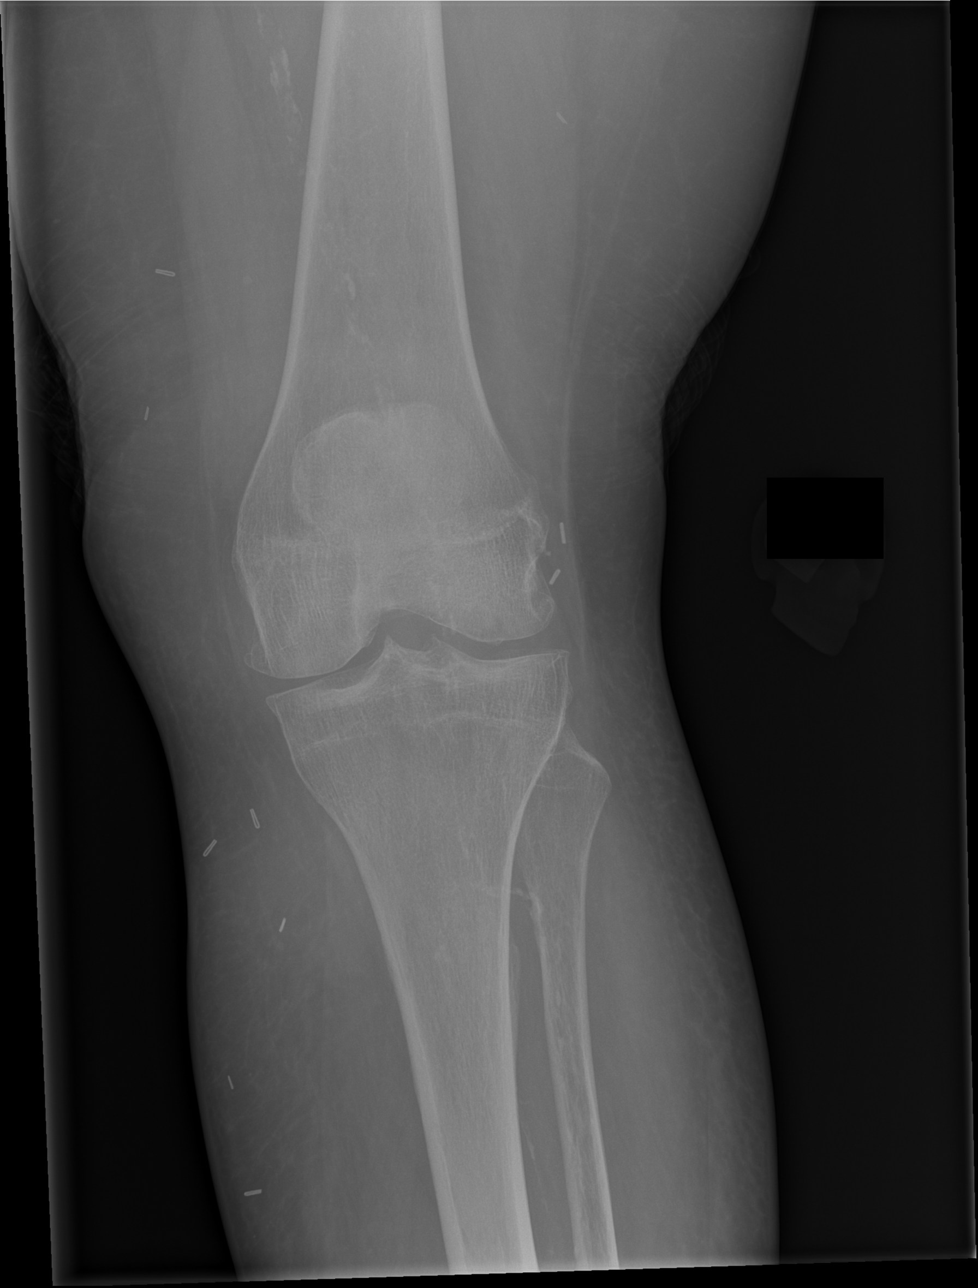

[x knee ap left (2 of 3)]
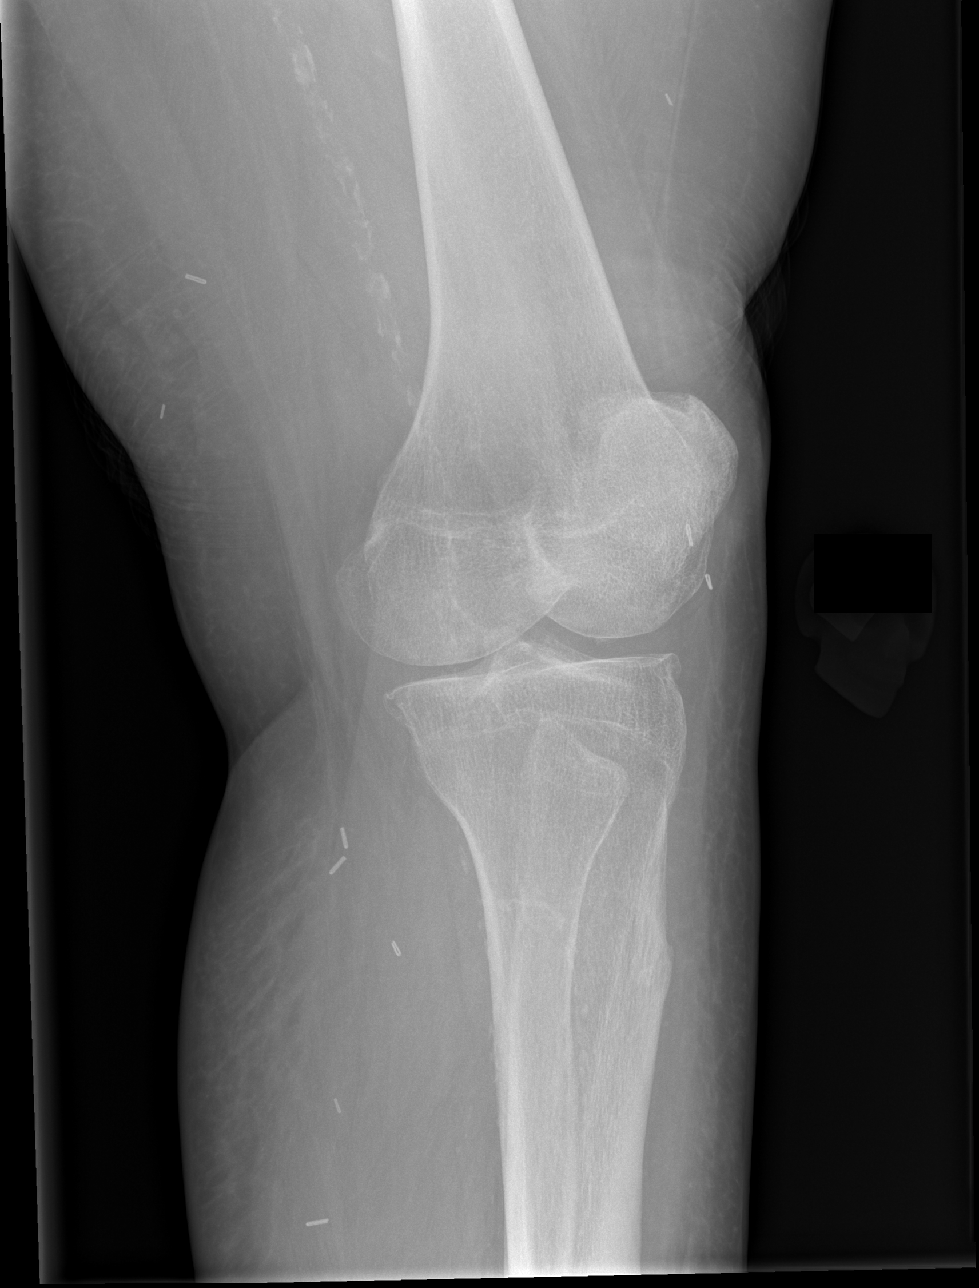

[x knee ap left (3 of 3)]
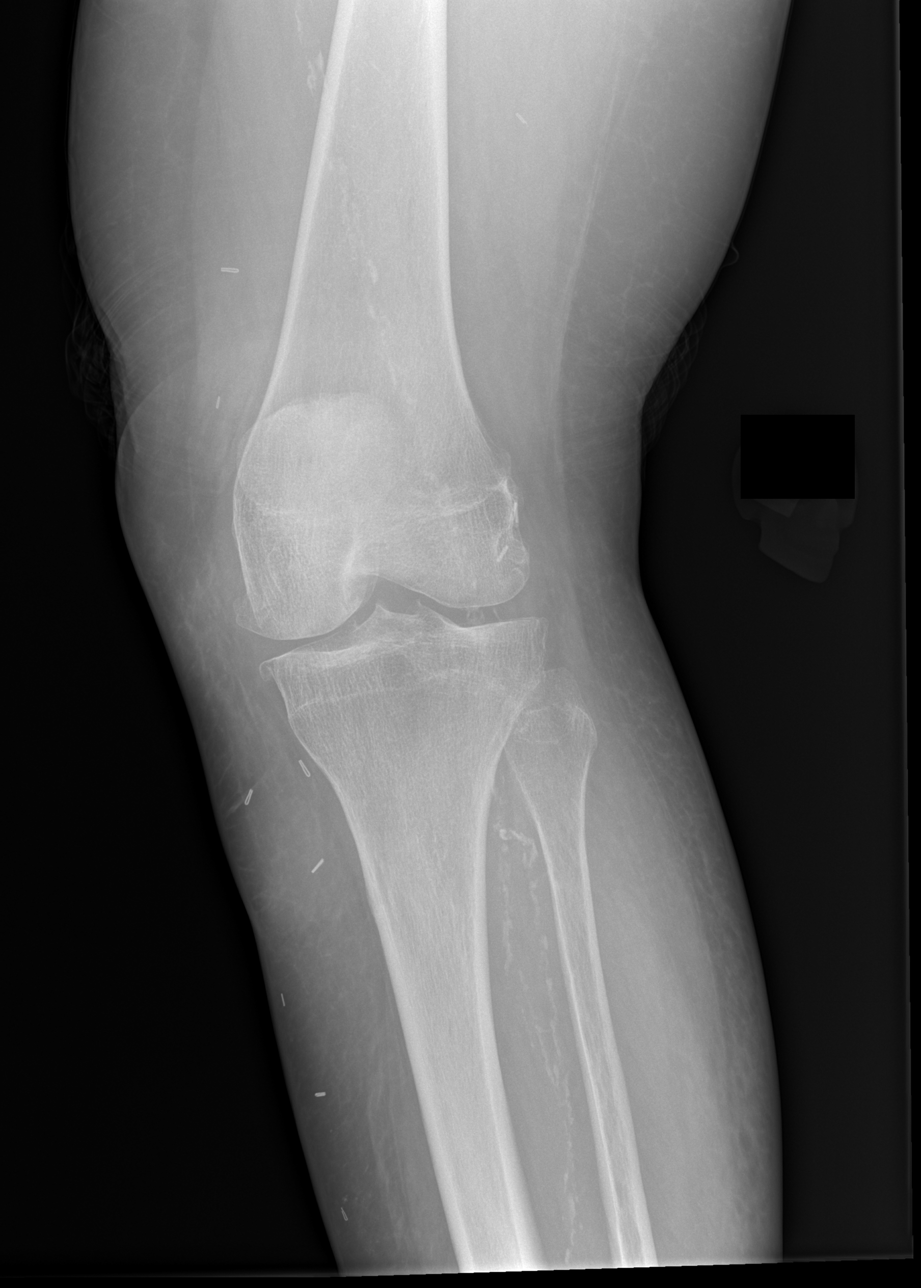

[x knee lat left]
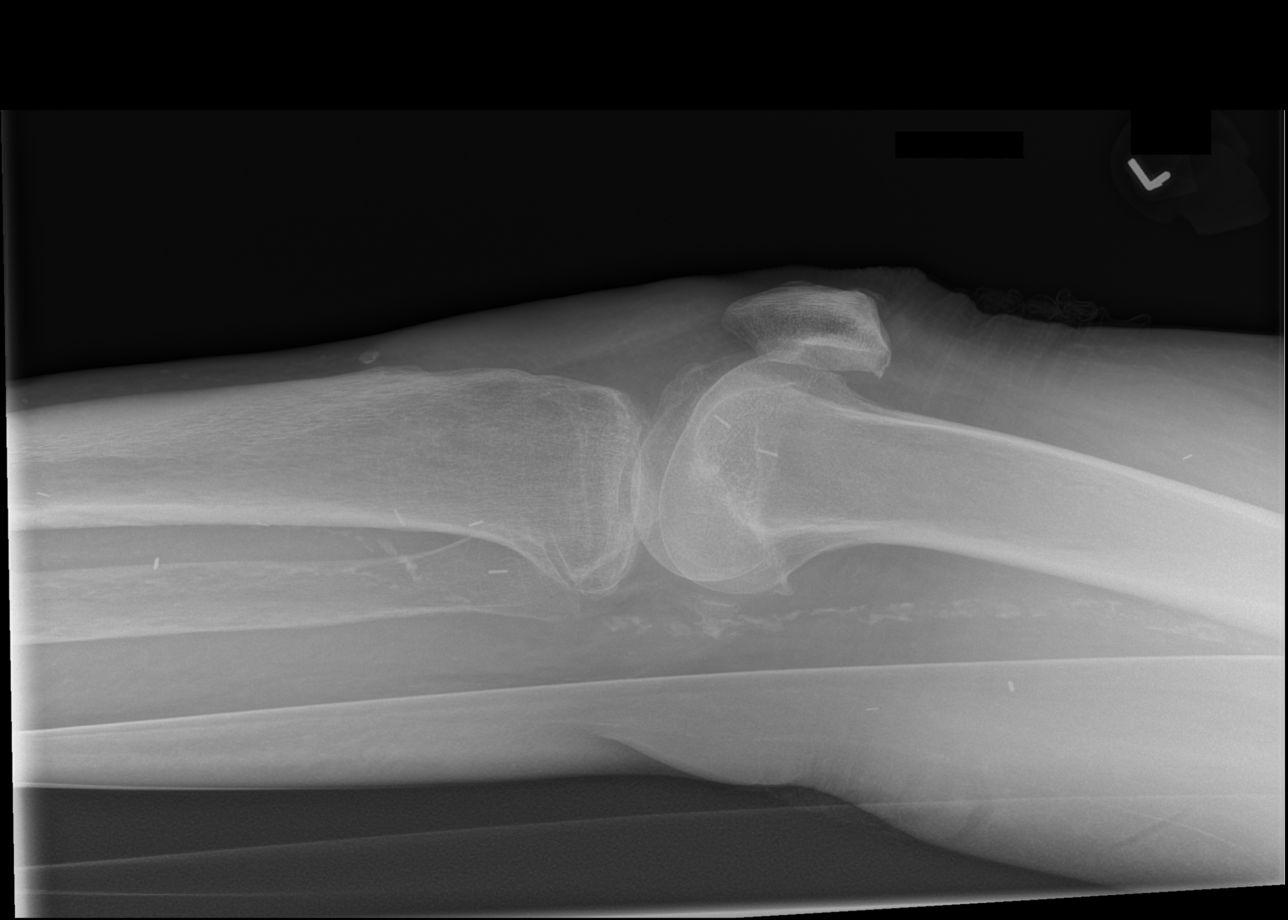

[4 of 4 positions shown; findings below may reference images not displayed]

FINDINGS: No acute fracture or dislocation. No aggressive osseous lesion.
Normal alignment. Generalized osteopenia. Mild medial femorotibial
compartment osteoarthritis. No joint effusion.

Soft tissue are unremarkable. No radiopaque foreign body or soft
tissue emphysema. Multiple surgical staples in the medial soft
tissues. Peripheral vascular atherosclerotic disease.
IMPRESSION: No acute osseous injury of the left knee.

## 2022-05-29 IMAGING — CT CT HEAD W/O CM
4 series · 15 of 47 positions shown, 17 images · non-contrast
Comparison: CT head dated 01/01/2021

CLINICAL DATA: Fall, headache, facial trauma, neck pain

EXAM:
CT HEAD WITHOUT CONTRAST
CT MAXILLOFACIAL WITHOUT CONTRAST
CT CERVICAL SPINE WITHOUT CONTRAST
TECHNIQUE: Multidetector CT imaging of the head, cervical spine, and
maxillofacial structures were performed using the standard protocol
without intravenous contrast. Multiplanar CT image reconstructions
of the cervical spine and maxillofacial structures were also
generated.

[Series 3: head wo · axial · 0.39mm/px · z∈[-120,-0]mm · 7 of 32 slices shown, 9 images]
[im 4/32  brain]
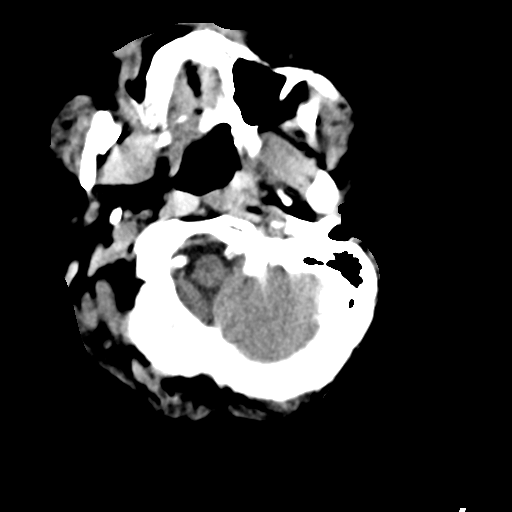
[im 4/32  bone]
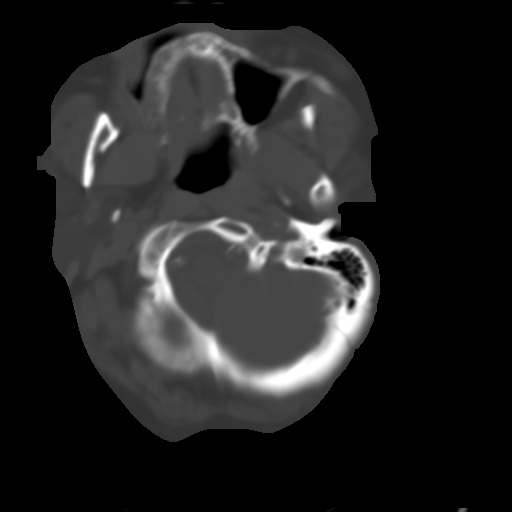
[im 8/32  brain]
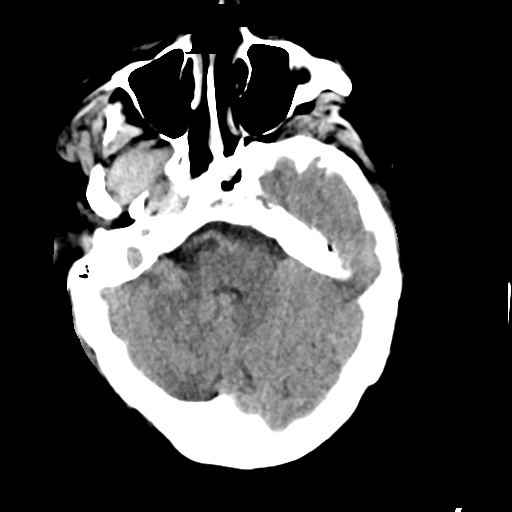
[im 12/32  brain]
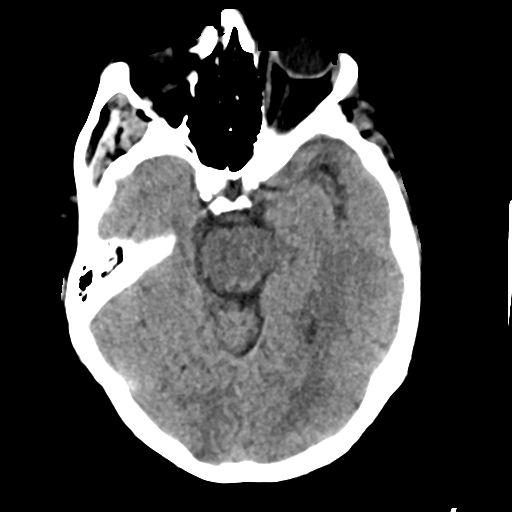
[im 16/32  brain]
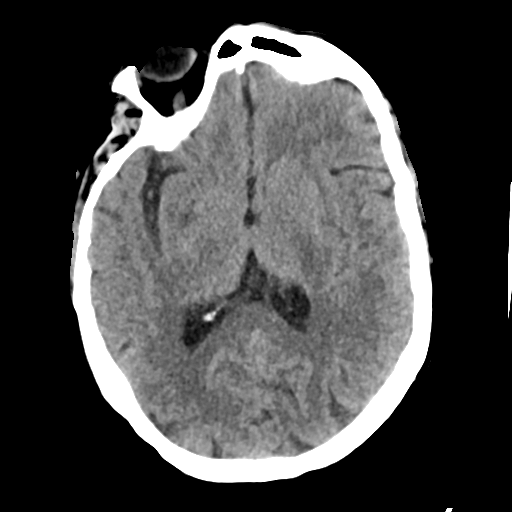
[im 20/32  brain]
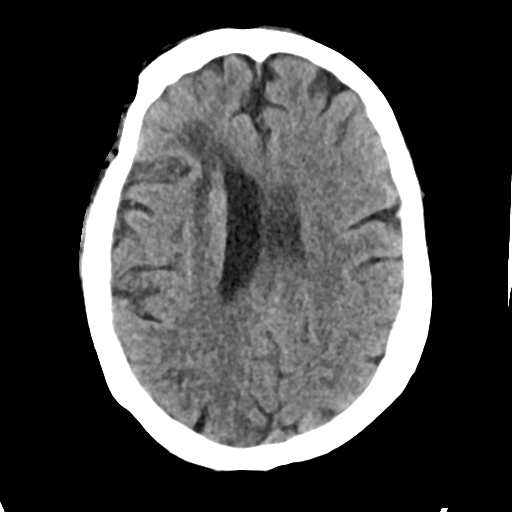
[im 20/32  bone]
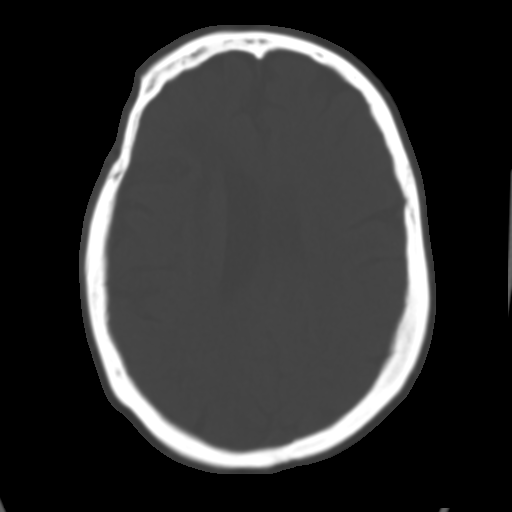
[im 24/32  brain]
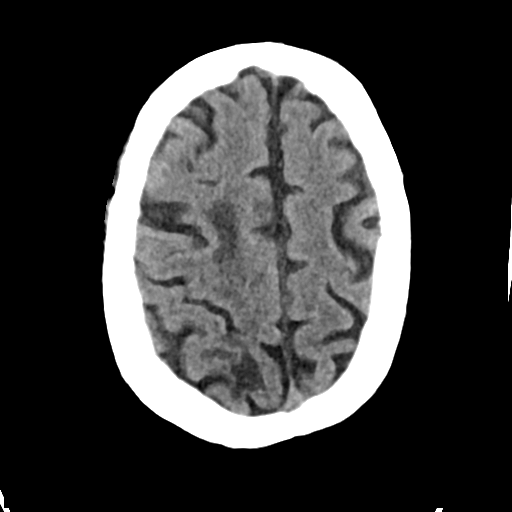
[im 28/32  brain]
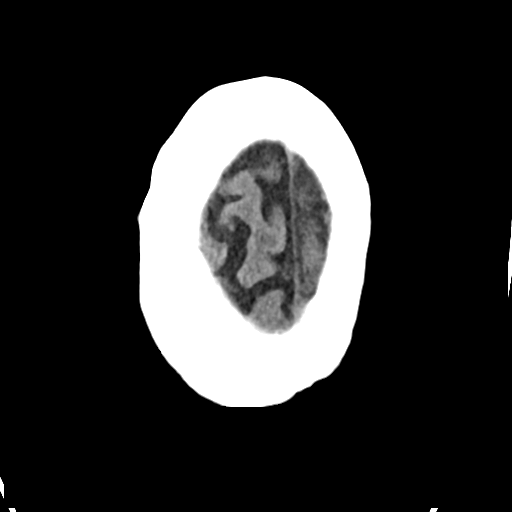

[Series 4: head bone · axial · 0.39mm/px · z∈[-122,-106]mm · 2 of 80 slices shown]
[im 8/80  bone]
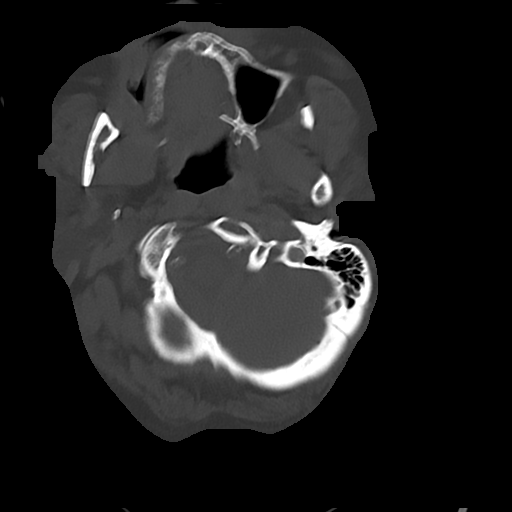
[im 16/80  bone]
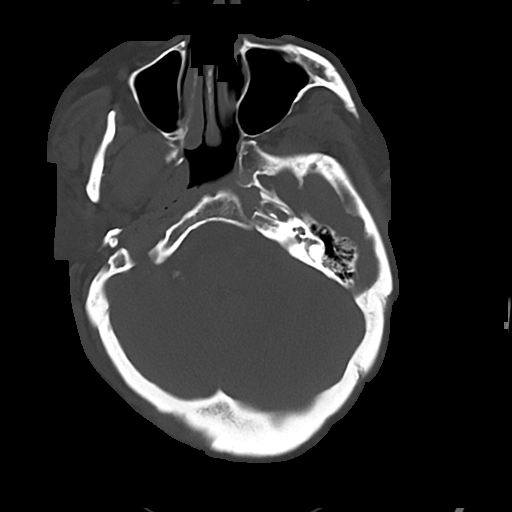

[Series 5: cor soft · coronal · 0.31mm/px · 3 of 68 slices shown]
[im 25/68  brain]
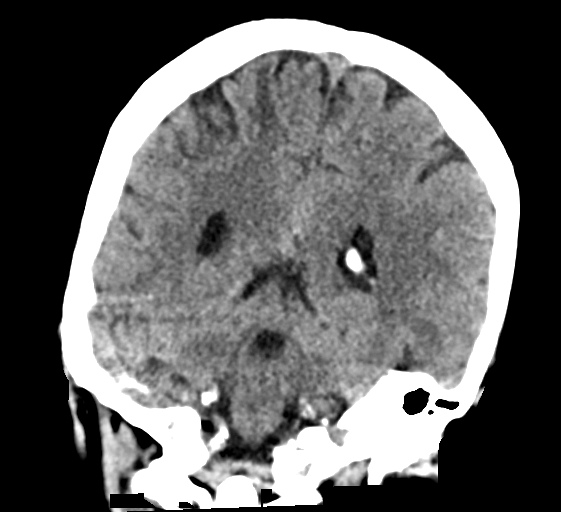
[im 31/68  brain]
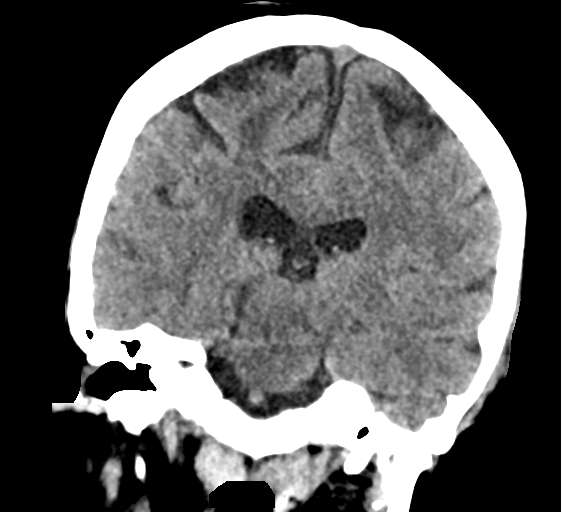
[im 37/68  brain]
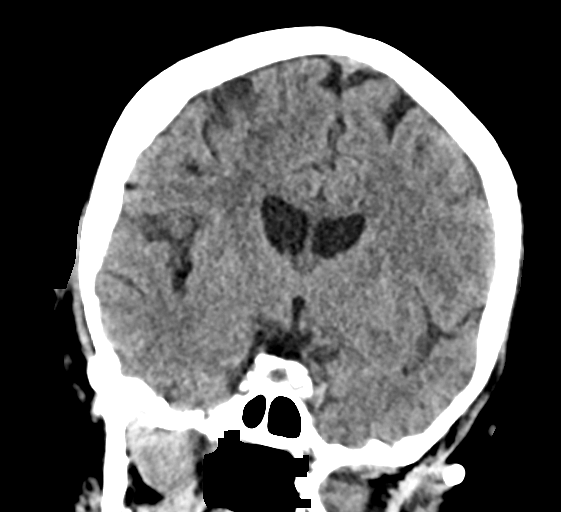

[Series 6: sag soft · sagittal · 0.33mm/px · 3 of 58 slices shown]
[im 20/58  brain]
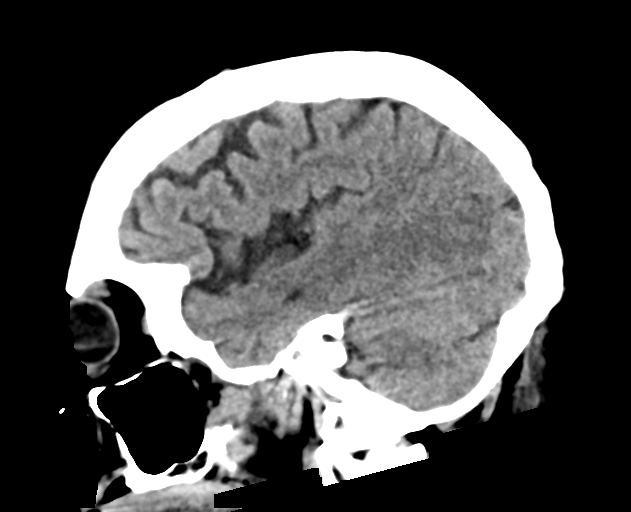
[im 29/58  brain]
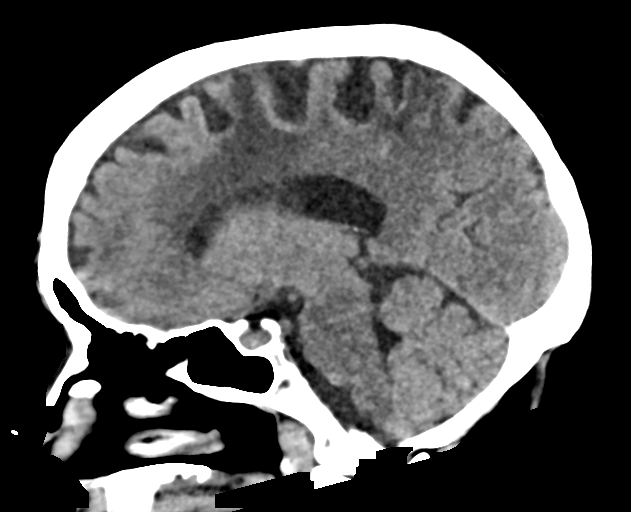
[im 39/58  brain]
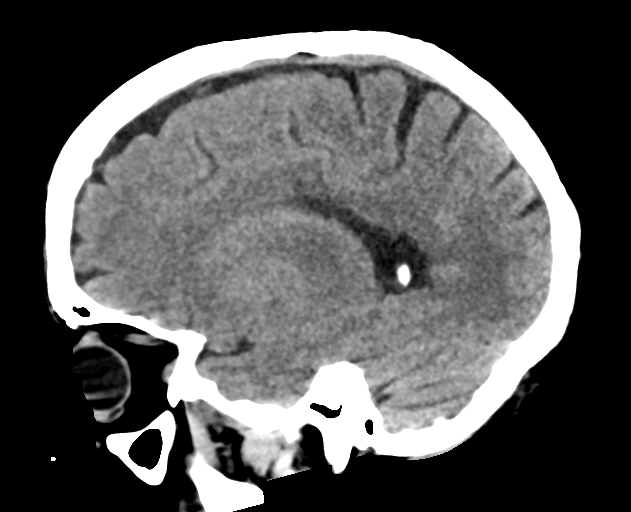

[15 of 47 positions shown; findings below may reference images not displayed]

FINDINGS: CT HEAD FINDINGS

Brain: No evidence of acute infarction, hemorrhage, hydrocephalus,
extra-axial collection or mass lesion/mass effect.

Old right MCA distribution infarct.

Mild subcortical white matter and periventricular small vessel
ischemic changes.

Vascular: Intracranial atherosclerosis.

Skull: Normal. Negative for fracture or focal lesion.

Other: The visualized paranasal sinuses are essentially clear. The
mastoid air cells are unopacified.

CT MAXILLOFACIAL FINDINGS

Osseous: No evidence of maxillofacial fracture. Mandible is intact.
Bilateral mandibular condyles are well-seated in the TMJs.

Orbits: Bilateral orbits, including the globes and retroconal soft
tissues, are within normal limits.

Sinuses: The visualized paranasal sinuses are essentially clear. The
mastoid air cells are unopacified.

Soft tissues: Negative.

CT CERVICAL SPINE FINDINGS

Alignment: Normal cervical lordosis.

Skull base and vertebrae: No acute fracture. No primary bone lesion
or focal pathologic process.

Soft tissues and spinal canal: No prevertebral fluid or swelling. No
visible canal hematoma.

Disc levels: Mild degenerative changes of the lower cervical spine.
Spinal canal is patent.

Upper chest: Visualized lung apices are clear.

Other: Visualized thyroid is unremarkable.
IMPRESSION: No evidence of acute intracranial abnormality. Old right MCA
distribution infarct. Mild small vessel ischemic changes.

No evidence of maxillofacial fracture.

No evidence of traumatic injury to the cervical spine. Mild
degenerative changes.

## 2022-05-29 IMAGING — CT CT MAXILLOFACIAL W/O CM
3 of 6 series · 14 of 47 positions shown, 17 images · non-contrast
Comparison: CT head dated 01/01/2021

CLINICAL DATA: Fall, headache, facial trauma, neck pain

EXAM:
CT HEAD WITHOUT CONTRAST
CT MAXILLOFACIAL WITHOUT CONTRAST
CT CERVICAL SPINE WITHOUT CONTRAST
TECHNIQUE: Multidetector CT imaging of the head, cervical spine, and
maxillofacial structures were performed using the standard protocol
without intravenous contrast. Multiplanar CT image reconstructions
of the cervical spine and maxillofacial structures were also
generated.

[Series 3: maxilllofacial 2.0 hr40 3 · axial · 0.32mm/px · z∈[-158,-38]mm · 9 of 72 slices shown, 12 images]
[im 6/72  brain]
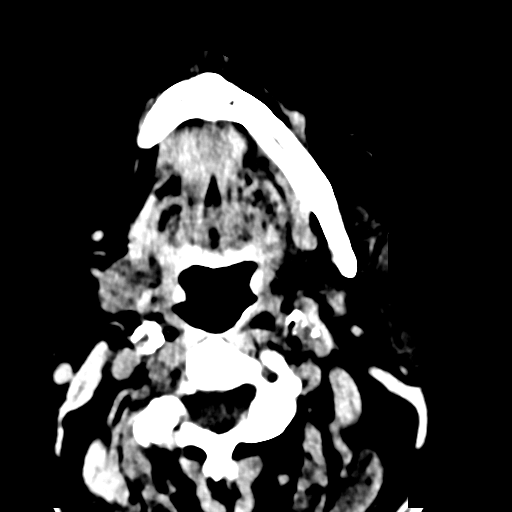
[im 6/72  bone]
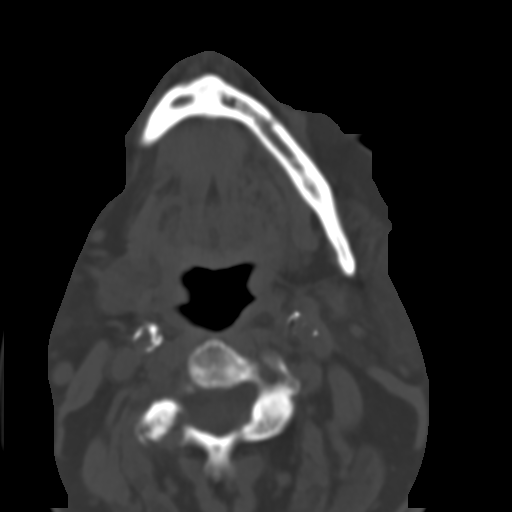
[im 16/72  bone]
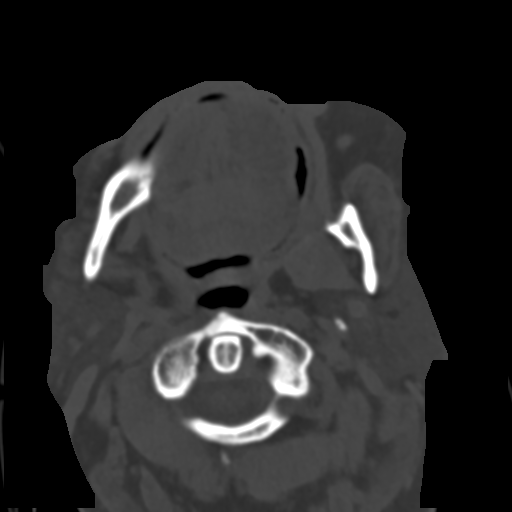
[im 21/72  bone]
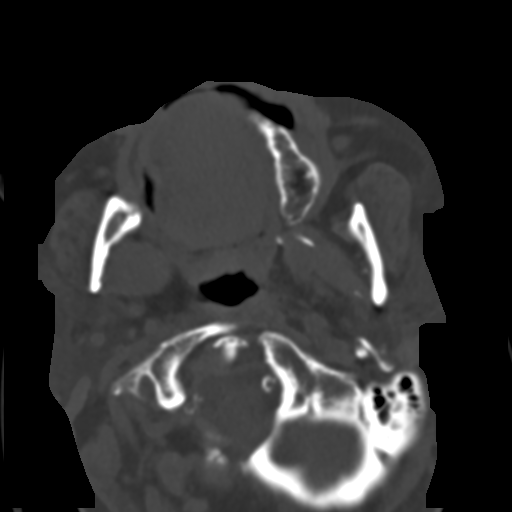
[im 31/72  bone]
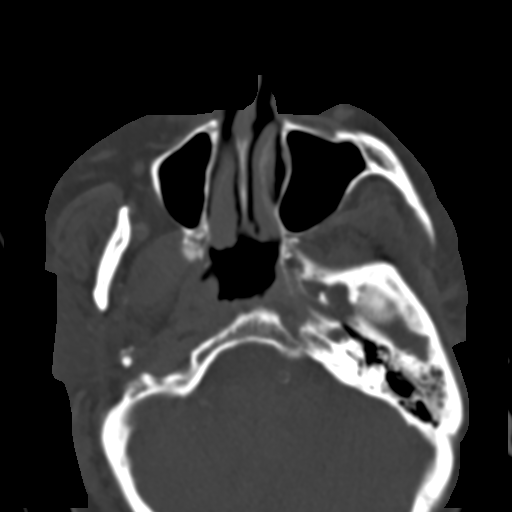
[im 36/72  brain]
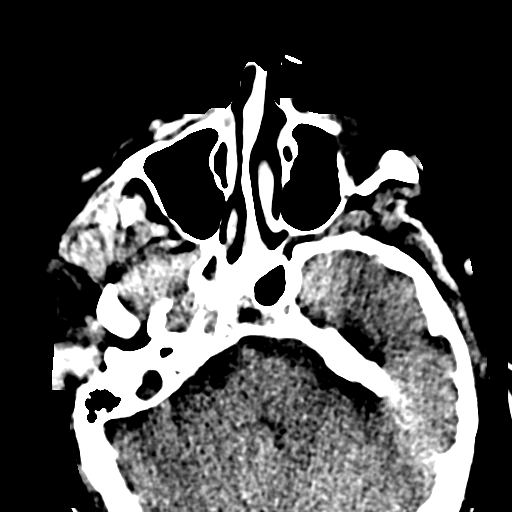
[im 36/72  bone]
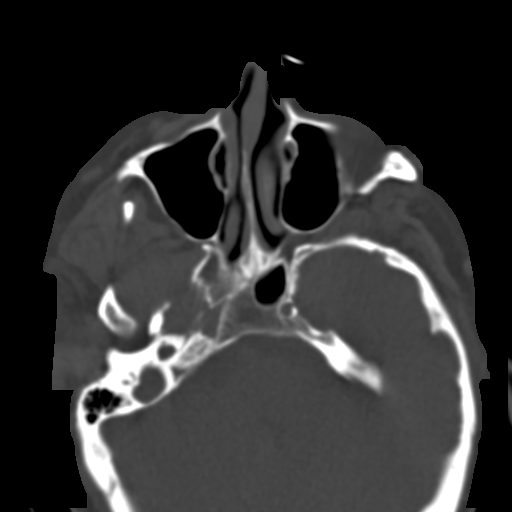
[im 41/72  bone]
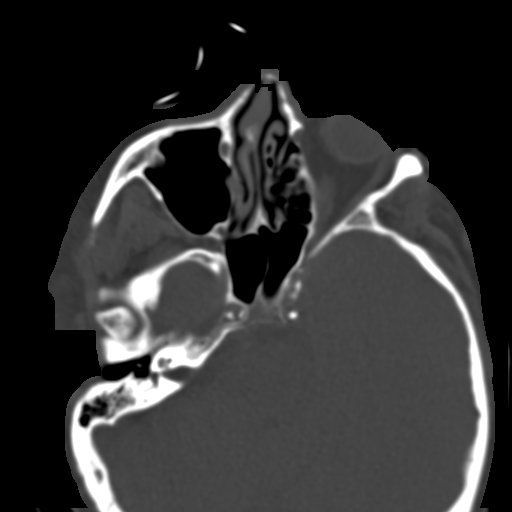
[im 51/72  bone]
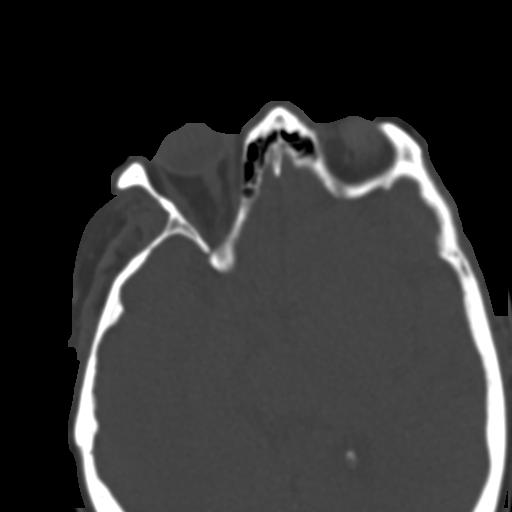
[im 56/72  bone]
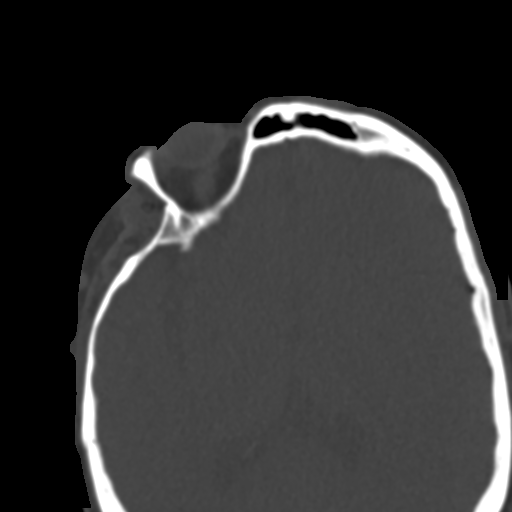
[im 66/72  brain]
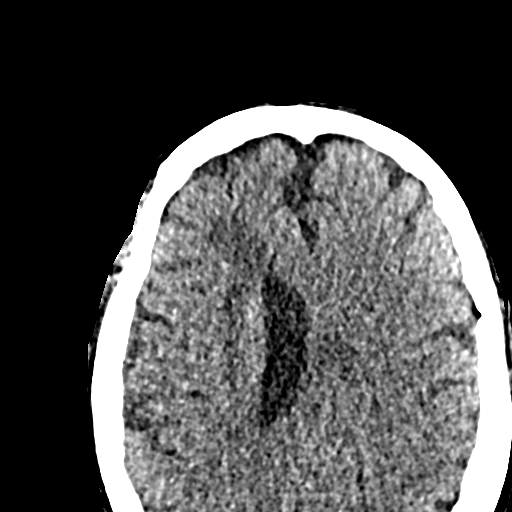
[im 66/72  bone]
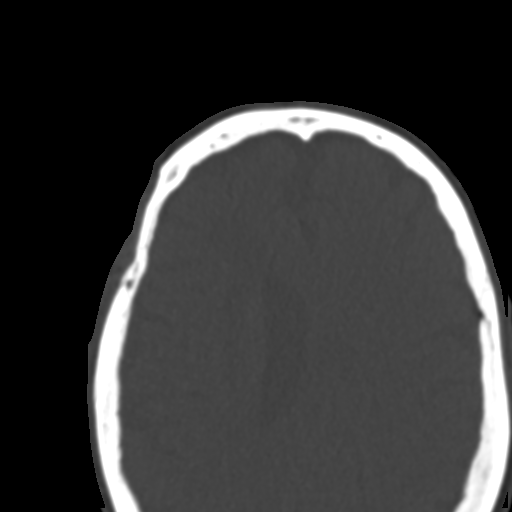

[Series 7: st cor · coronal · 0.28mm/px · 3 of 104 slices shown]
[im 26/104  bone]
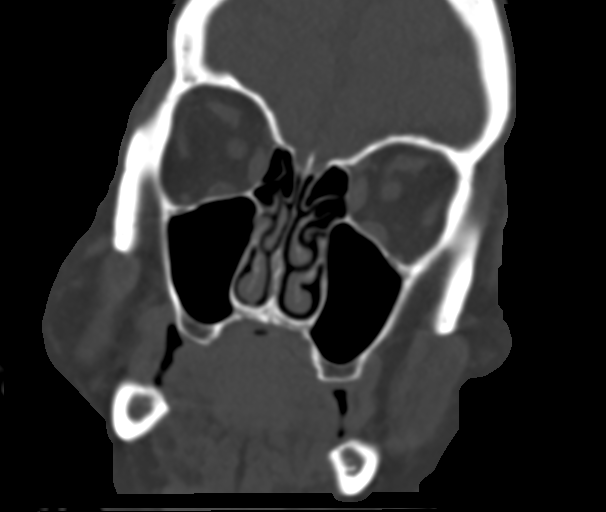
[im 52/104  bone]
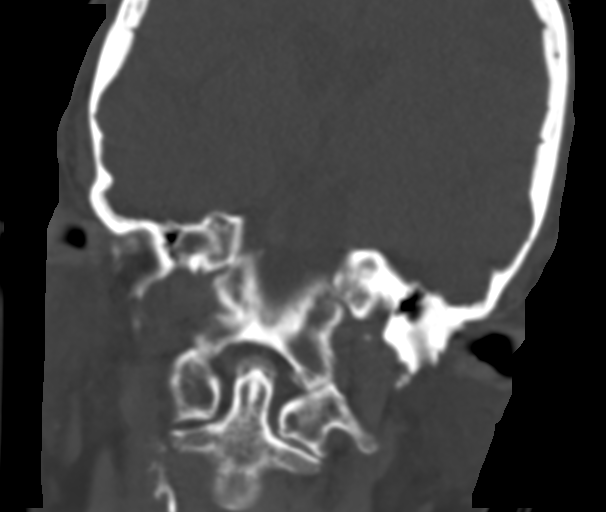
[im 78/104  bone]
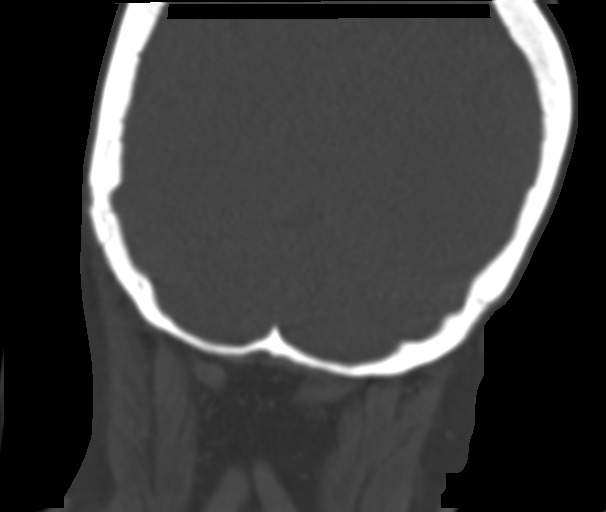

[Series 10: bone sag · sagittal · 0.29mm/px · 2 of 87 slices shown]
[im 29/87  bone]
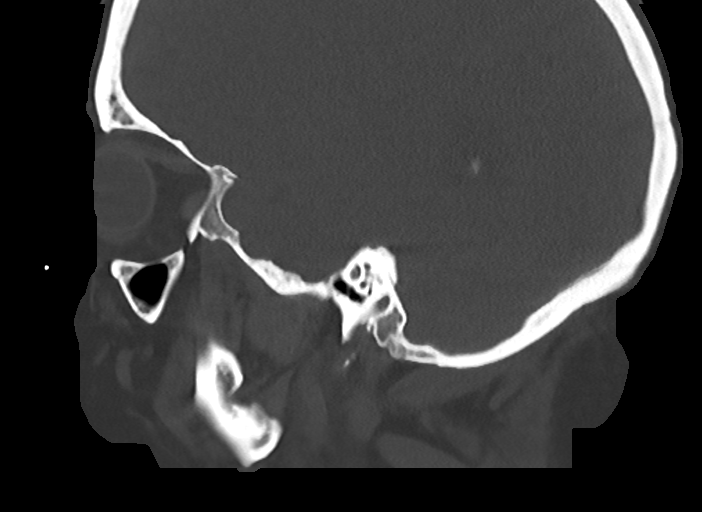
[im 58/87  bone]
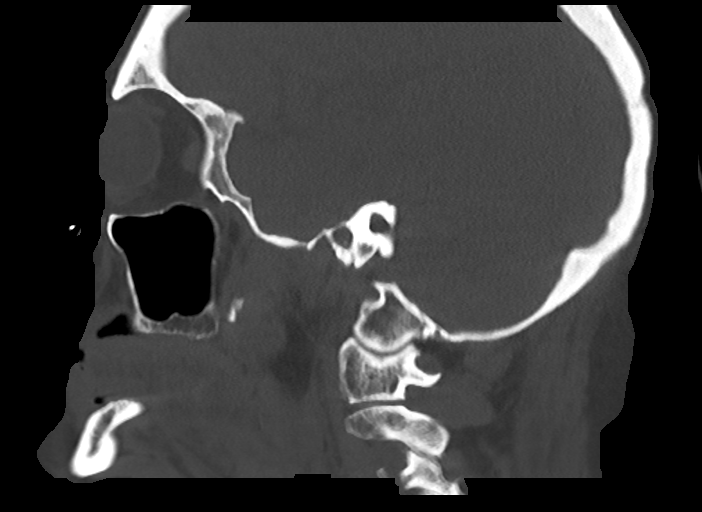

[14 of 47 positions shown; findings below may reference images not displayed]

FINDINGS: CT HEAD FINDINGS

Brain: No evidence of acute infarction, hemorrhage, hydrocephalus,
extra-axial collection or mass lesion/mass effect.

Old right MCA distribution infarct.

Mild subcortical white matter and periventricular small vessel
ischemic changes.

Vascular: Intracranial atherosclerosis.

Skull: Normal. Negative for fracture or focal lesion.

Other: The visualized paranasal sinuses are essentially clear. The
mastoid air cells are unopacified.

CT MAXILLOFACIAL FINDINGS

Osseous: No evidence of maxillofacial fracture. Mandible is intact.
Bilateral mandibular condyles are well-seated in the TMJs.

Orbits: Bilateral orbits, including the globes and retroconal soft
tissues, are within normal limits.

Sinuses: The visualized paranasal sinuses are essentially clear. The
mastoid air cells are unopacified.

Soft tissues: Negative.

CT CERVICAL SPINE FINDINGS

Alignment: Normal cervical lordosis.

Skull base and vertebrae: No acute fracture. No primary bone lesion
or focal pathologic process.

Soft tissues and spinal canal: No prevertebral fluid or swelling. No
visible canal hematoma.

Disc levels: Mild degenerative changes of the lower cervical spine.
Spinal canal is patent.

Upper chest: Visualized lung apices are clear.

Other: Visualized thyroid is unremarkable.
IMPRESSION: No evidence of acute intracranial abnormality. Old right MCA
distribution infarct. Mild small vessel ischemic changes.

No evidence of maxillofacial fracture.

No evidence of traumatic injury to the cervical spine. Mild
degenerative changes.

## 2022-05-29 IMAGING — CT CT CERVICAL SPINE W/O CM
3 of 4 series · 13 of 33 positions shown, 16 images · non-contrast
Comparison: CT head dated 01/01/2021

CLINICAL DATA: Fall, headache, facial trauma, neck pain

EXAM:
CT HEAD WITHOUT CONTRAST
CT MAXILLOFACIAL WITHOUT CONTRAST
CT CERVICAL SPINE WITHOUT CONTRAST
TECHNIQUE: Multidetector CT imaging of the head, cervical spine, and
maxillofacial structures were performed using the standard protocol
without intravenous contrast. Multiplanar CT image reconstructions
of the cervical spine and maxillofacial structures were also
generated.

[Series 5: orthogonal axials · oblique · 0.21mm/px · 5 of 73 slices shown, 7 images]
[im 13/73  soft-tissue]
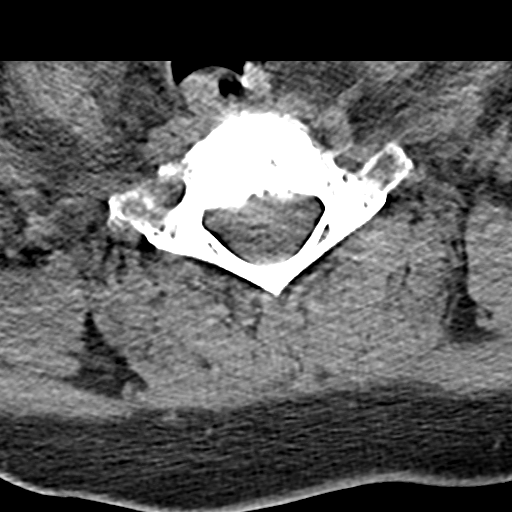
[im 13/73  bone]
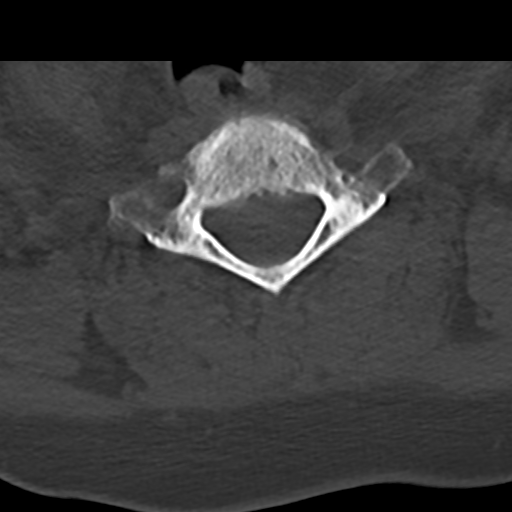
[im 25/73  bone]
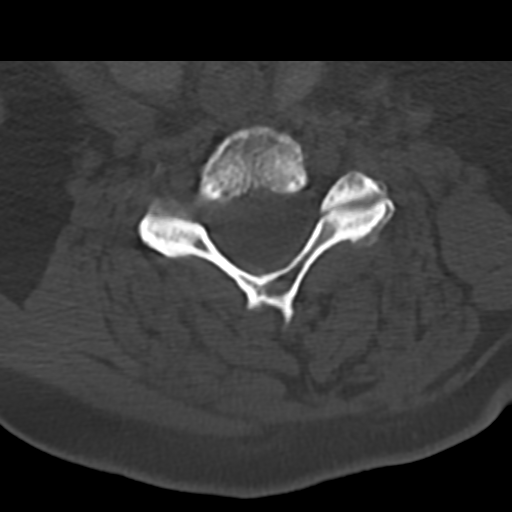
[im 37/73  bone]
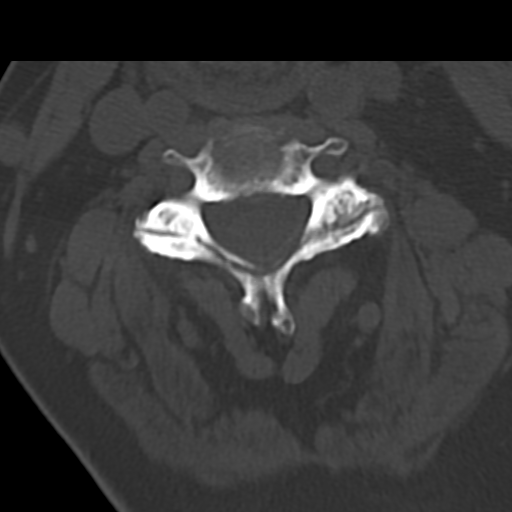
[im 49/73  bone]
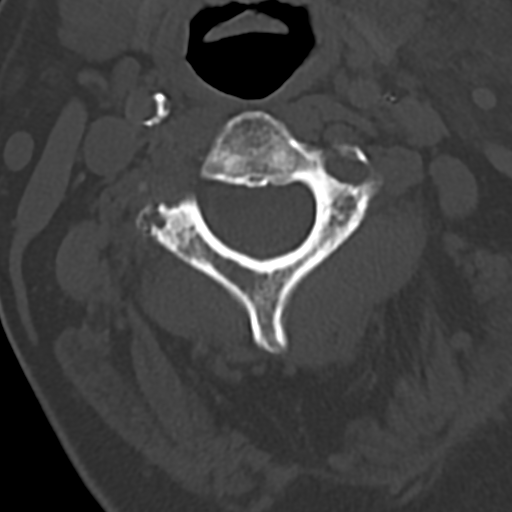
[im 61/73  soft-tissue]
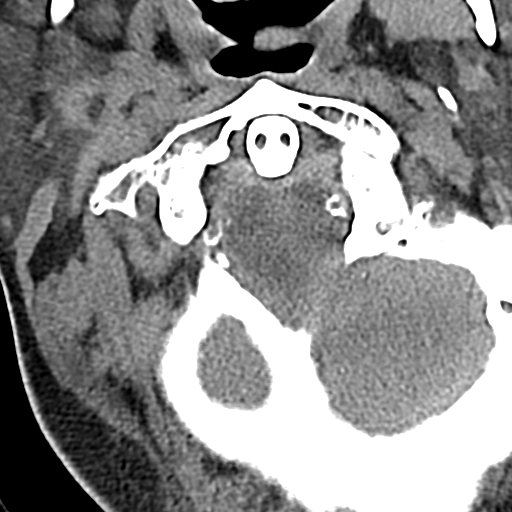
[im 61/73  bone]
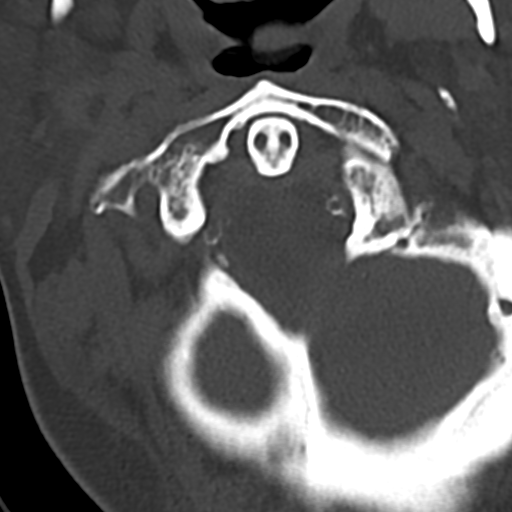

[Series 9: sag bone · sagittal · 0.31mm/px · 5 of 94 slices shown, 6 images]
[im 32/94  bone]
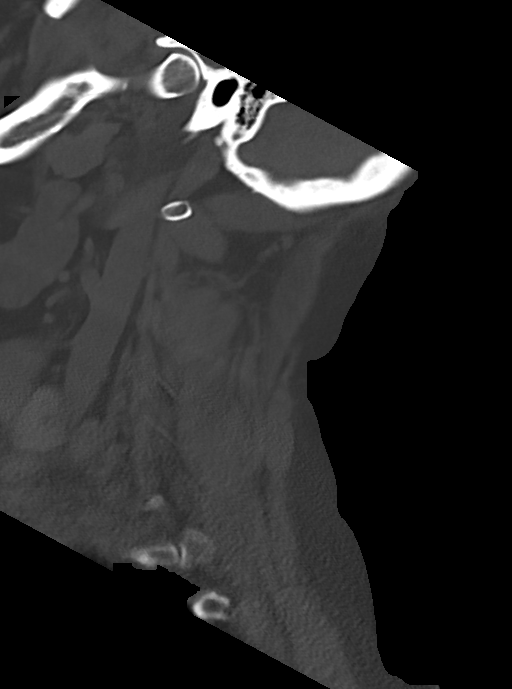
[im 39/94  bone]
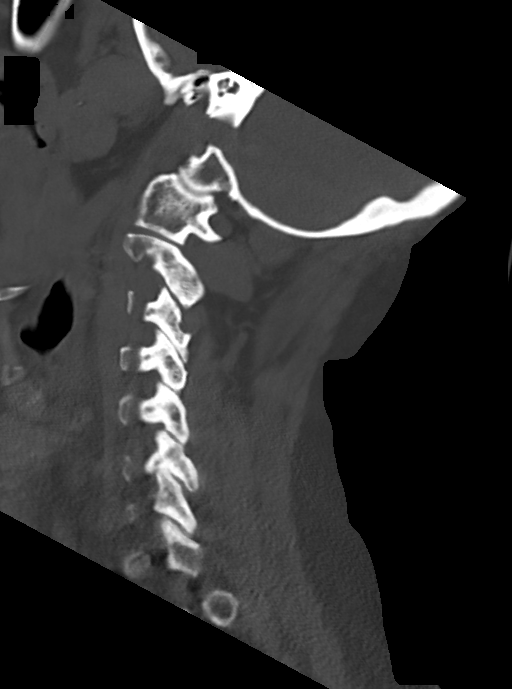
[im 47/94  soft-tissue]
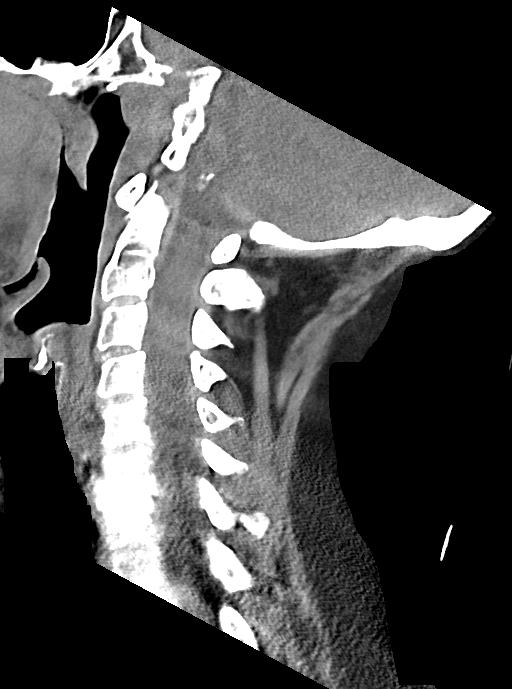
[im 47/94  bone]
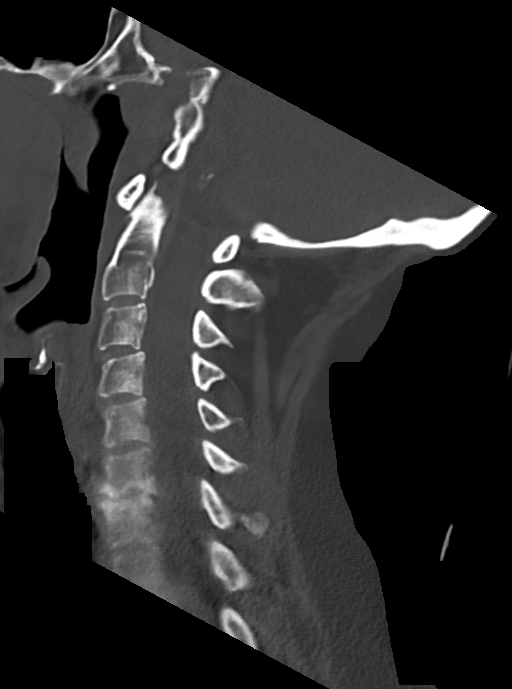
[im 55/94  bone]
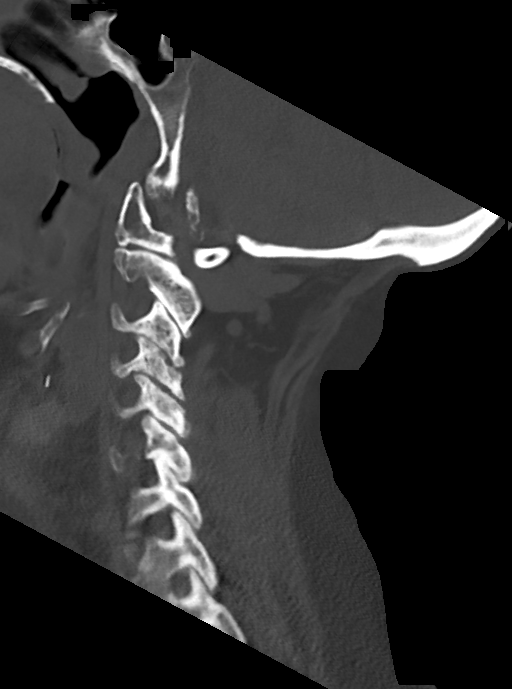
[im 63/94  bone]
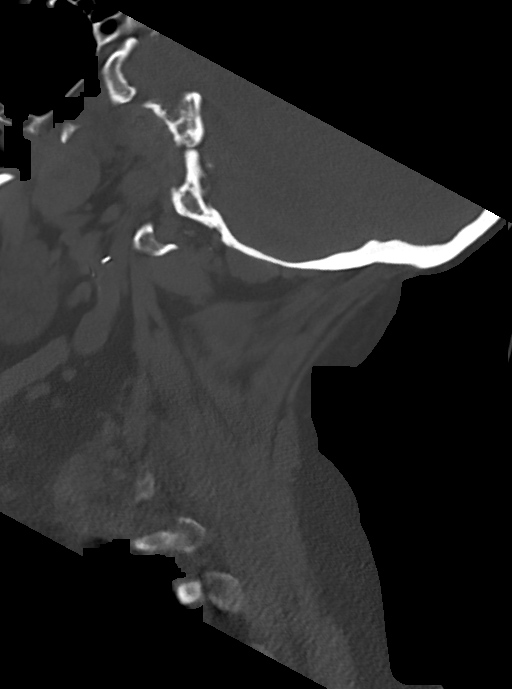

[Series 10: cor bone · coronal · 0.32mm/px · 3 of 86 slices shown]
[im 18/86  bone]
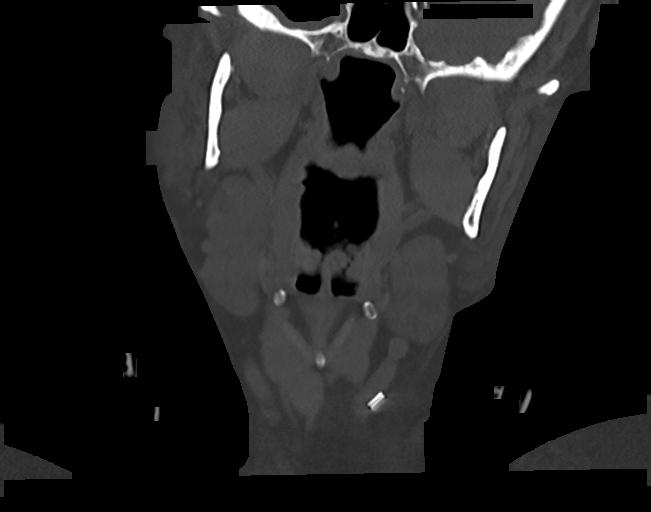
[im 35/86  bone]
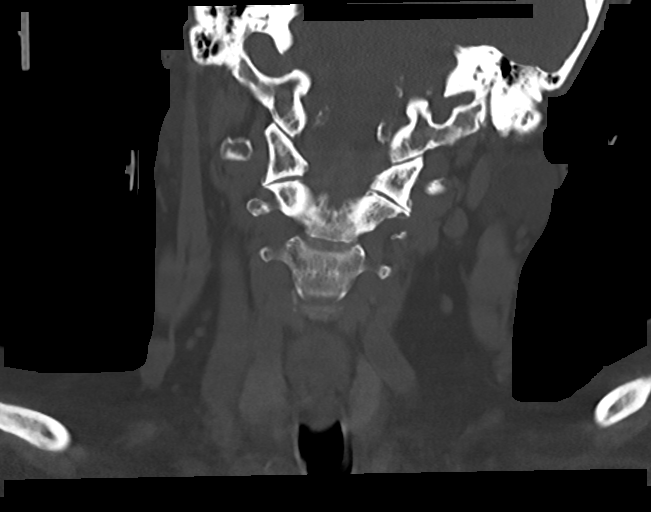
[im 52/86  bone]
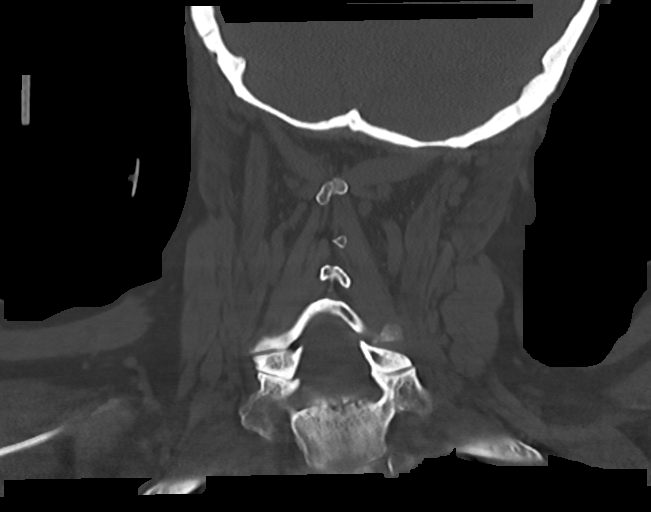

[13 of 33 positions shown; findings below may reference images not displayed]

FINDINGS: CT HEAD FINDINGS

Brain: No evidence of acute infarction, hemorrhage, hydrocephalus,
extra-axial collection or mass lesion/mass effect.

Old right MCA distribution infarct.

Mild subcortical white matter and periventricular small vessel
ischemic changes.

Vascular: Intracranial atherosclerosis.

Skull: Normal. Negative for fracture or focal lesion.

Other: The visualized paranasal sinuses are essentially clear. The
mastoid air cells are unopacified.

CT MAXILLOFACIAL FINDINGS

Osseous: No evidence of maxillofacial fracture. Mandible is intact.
Bilateral mandibular condyles are well-seated in the TMJs.

Orbits: Bilateral orbits, including the globes and retroconal soft
tissues, are within normal limits.

Sinuses: The visualized paranasal sinuses are essentially clear. The
mastoid air cells are unopacified.

Soft tissues: Negative.

CT CERVICAL SPINE FINDINGS

Alignment: Normal cervical lordosis.

Skull base and vertebrae: No acute fracture. No primary bone lesion
or focal pathologic process.

Soft tissues and spinal canal: No prevertebral fluid or swelling. No
visible canal hematoma.

Disc levels: Mild degenerative changes of the lower cervical spine.
Spinal canal is patent.

Upper chest: Visualized lung apices are clear.

Other: Visualized thyroid is unremarkable.
IMPRESSION: No evidence of acute intracranial abnormality. Old right MCA
distribution infarct. Mild small vessel ischemic changes.

No evidence of maxillofacial fracture.

No evidence of traumatic injury to the cervical spine. Mild
degenerative changes.

## 2022-06-02 ENCOUNTER — Ambulatory Visit: Payer: Medicare Other | Admitting: Physician Assistant

## 2022-06-03 ENCOUNTER — Encounter (HOSPITAL_COMMUNITY): Payer: Medicare Other

## 2022-06-06 ENCOUNTER — Telehealth (HOSPITAL_COMMUNITY): Payer: Self-pay | Admitting: Vascular Surgery

## 2022-06-06 ENCOUNTER — Telehealth (HOSPITAL_COMMUNITY): Payer: Self-pay

## 2022-06-06 ENCOUNTER — Other Ambulatory Visit: Payer: Self-pay | Admitting: Family Medicine

## 2022-06-06 DIAGNOSIS — E1151 Type 2 diabetes mellitus with diabetic peripheral angiopathy without gangrene: Secondary | ICD-10-CM

## 2022-06-06 NOTE — Telephone Encounter (Signed)
Returned pt call to resch missed appt 7/21

## 2022-06-07 ENCOUNTER — Other Ambulatory Visit: Payer: Self-pay | Admitting: Family Medicine

## 2022-06-07 DIAGNOSIS — E039 Hypothyroidism, unspecified: Secondary | ICD-10-CM

## 2022-06-07 DIAGNOSIS — F419 Anxiety disorder, unspecified: Secondary | ICD-10-CM

## 2022-06-15 ENCOUNTER — Ambulatory Visit: Payer: Medicare Other | Admitting: Physician Assistant

## 2022-06-15 NOTE — Progress Notes (Deleted)
Patient ID: [name], female   DOB: 1961/05/12, 61 y.o.   MRN: [id number]   After hospitalization 6/4-04/22/2022: Discharge Diagnoses:  Principal Problem:   Viral gastroenteritis Active Problems:   DM (diabetes mellitus), type 2 with peripheral vascular complications ([place])   Long term current use of anticoagulant therapy   Hypothyroidism   Hypokalemia   Chronic combined systolic and diastolic CHF (congestive heart failure) ([name])   Severe obesity (BMI >= 40) ([name])   Depression   CAD (coronary artery disease)   Low back pain   Morbid obesity ([name])   Stage 3b chronic kidney disease ([place])   Uncontrolled type 2 diabetes mellitus with hyperglycemia ([place])    Brief Hospital Course: Sepsis due to viral gastroenteritis: Sepsis physiology has resolved-nausea/vomiting and diarrhea have completely resolved.  Leukocytosis has resolved-stool work-up negative for any infection.   Hypokalemia: Due to GI loss-repleted.   Hyponatremia: Mild-watch closely.   HFpEF: Reasonably well compensated-tolerating initiation of diuretics well-and back on her usual diuretic regimen.   PAF: Continue amiodarone and Eliquis   History of junctional bradycardia: No longer on Cardizem/beta-blocker.  Continue telemetry monitoring   HTN: BP stable-continue amlodipine-should be able to resume hydralazine and losartan on discharge as well.   HLD: Continue statin   PAD-s/p right TMA   History of carotid stenosis-s/p left CEA   Right leg swelling: Erythema/swelling spontaneously has improved-RLE Doppler negative for DVT.  Suspect-slightly worsening edema may have caused erythema.   Mechanical fall: Occurred on 6/8-this occurred while see attempted to grab her undergarments from the closet.  She is completely awake and alert-she claims to have hit her right shoulder-x-ray was negative for any fractures.  Pain is significantly better this morning-she has good range of motion.  On exam shoulder is without any  swelling.   CKD stage IIIb: Close to baseline-follow periodically.   DM-2 (A1c 10.3 on 6/5): CBG slowly creeping up-we will resume her usual insulin regimen/oral hypoglycemic regimen on discharge.

## 2022-06-17 ENCOUNTER — Other Ambulatory Visit: Payer: Self-pay | Admitting: Family Medicine

## 2022-06-17 NOTE — Telephone Encounter (Signed)
Requested medication (s) are due for refill today: yes  Requested medication (s) are on the active medication list: yes  Last refill:  04/18/22, historical provider so no further details  Future visit scheduled: yes 07/07/22  Notes to clinic:  Historical Provider, please assess.       Requested Prescriptions  Pending Prescriptions Disp Refills   TRULICITY 0.21 RZ/7.3VA SOPN [Pharmacy Med Name: TRULICITY 7.01 ID/0.3UD SUBCUTANEOUS SOLUTION PEN-INJECTOR] 2 mL     Sig: Inject 0.75 mg into the skin every Friday.     Endocrinology:  Diabetes - GLP-1 Receptor Agonists Failed - 06/17/2022  4:48 PM      Failed - HBA1C is between 0 and 7.9 and within 180 days    Hemoglobin A1C  Date Value Ref Range Status  06/21/2017 9.8  Final   HbA1c, POC (controlled diabetic range)  Date Value Ref Range Status  08/11/2021 10.5 (A) 0.0 - 7.0 % Final   Hgb A1c MFr Bld  Date Value Ref Range Status  04/18/2022 10.3 (H) 4.8 - 5.6 % Final    Comment:    (NOTE) Pre diabetes:          5.7%-6.4%  Diabetes:              >6.4%  Glycemic control for   <7.0% adults with diabetes          Failed - Valid encounter within last 6 months    Recent Outpatient Visits           7 months ago DM (diabetes mellitus), type 2 with peripheral vascular complications ([place])   [place], [place], MD   8 months ago DM (diabetes mellitus), type 2 with peripheral vascular complications [facility])   [place], [place], [place]   10 months ago DM (diabetes mellitus), type 2 with peripheral vascular complications [facility])   [place], [place], MD   11 months ago DM (diabetes mellitus), type 2 with peripheral vascular complications [facility])   [place], Charlane Ferretti, MD   6 years ago Type 2 diabetes mellitus with hyperglycemia   [place], [name],  MD       Future Appointments             In 2 weeks [place], Dionne Bucy, PA-C [place]

## 2022-06-22 DIAGNOSIS — H35033 Hypertensive retinopathy, bilateral: Secondary | ICD-10-CM | POA: Diagnosis not present

## 2022-06-22 DIAGNOSIS — H353122 Nonexudative age-related macular degeneration, left eye, intermediate dry stage: Secondary | ICD-10-CM | POA: Diagnosis not present

## 2022-06-22 DIAGNOSIS — H35363 Drusen (degenerative) of macula, bilateral: Secondary | ICD-10-CM | POA: Diagnosis not present

## 2022-06-22 DIAGNOSIS — E113291 Type 2 diabetes mellitus with mild nonproliferative diabetic retinopathy without macular edema, right eye: Secondary | ICD-10-CM | POA: Diagnosis not present

## 2022-06-22 DIAGNOSIS — H34832 Tributary (branch) retinal vein occlusion, left eye, with macular edema: Secondary | ICD-10-CM | POA: Diagnosis not present

## 2022-06-22 DIAGNOSIS — E113412 Type 2 diabetes mellitus with severe nonproliferative diabetic retinopathy with macular edema, left eye: Secondary | ICD-10-CM | POA: Diagnosis not present

## 2022-06-28 ENCOUNTER — Telehealth: Payer: Self-pay

## 2022-06-28 NOTE — Telephone Encounter (Signed)
Call placed to patient and VM was left informing patient to return phone call to schedule AWV.

## 2022-06-30 ENCOUNTER — Other Ambulatory Visit (HOSPITAL_COMMUNITY): Payer: Self-pay

## 2022-06-30 NOTE — Progress Notes (Addendum)
Paramedicine Encounter    Patient ID: [name], female    DOB: 27-May-1961, 61 y.o.   MRN: [id number]  I have not been able to reach pt on the phone so I dropped by.  She reports she is doing ok however she is having stomach upset and bad reflux again.  She reports Monday she goes to see her diabetic eye doc. She had forgotten about the cardiomems use the past few days.  She denies increased sob, no dizziness, no c/p.  She has not been weighing self in some time.   Pt is on bubble packs.  She reports good med compliance.  Bubble packs checked and everything is accurate.  She has clinic appoint next Friday, I will f/u the week after.   BP (!) 142/64   Pulse 80   Resp 16   Wt 225 lb (102.1 kg)   LMP  (LMP Unknown)   SpO2 98%   BMI 38.62 kg/m  Weight yesterday-? Last visit weight-?  Patient Care Team: Charlott Rakes, MD as PCP - General (Family Medicine) [name], MD as PCP - Cardiology (Cardiology) [name], MD as PCP - Electrophysiology (Cardiology) [name], MD as Consulting Physician (Cardiology) [name], MD as Consulting Physician (Pulmonary Disease) System, Provider Not In  Patient Active Problem List   Diagnosis Date Noted   Pre-ulcerative calluses 04/25/2022   Viral gastroenteritis 04/18/2022   Hyperlipidemia associated with type 2 diabetes mellitus ([place]) 07/29/2021   Uncontrolled type 2 diabetes mellitus with hyperglycemia ([place]) 06/13/2021   Acute kidney injury superimposed on CKD ([place]) 06/13/2021   Critical ischemia of foot ([place]) 06/07/2021   Fall 05/05/2021   PAF (paroxysmal atrial fibrillation) ([place]) 05/05/2021   COPD (chronic obstructive pulmonary disease) ([place]) 05/05/2021   DM (diabetes mellitus), type 2 with complications ([place]) 74/25/9563   PAD (peripheral artery disease) ([place]) 05/05/2021   Cellulitis 05/05/2021   Acute on chronic combined systolic and diastolic CHF (congestive heart failure) ([place]) 05/05/2021   OSA  (obstructive sleep apnea) 05/05/2021   Stage 3b chronic kidney disease ([place]) 05/05/2021   AKI (acute kidney injury) ([place]) 05/04/2021   Pressure injury of skin 05/04/2021   Ulcer of foot, unspecified laterality, limited to breakdown of skin ([place]) 02/17/2021   AF (paroxysmal atrial fibrillation) ([place]) 01/02/2021   CAD (coronary artery disease) 01/02/2021   PVD (peripheral vascular disease) ([place]) 01/02/2021   Diabetes mellitus type 2, uncontrolled, with complications 87/56/4332   Diabetic nephropathy ([place]) 01/02/2021   Low back pain 06/01/2020   Hyperlipidemia 04/03/2020   Muscle weakness 03/20/2020   Pain in elbow 03/12/2020   PAD (peripheral artery disease) ([place]) 12/26/2019   Acute renal failure ([place])    Acute respiratory failure with hypoxia ([place]) 12/02/2019   Cellulitis in diabetic foot ([place]) 07/08/2019   Osteomyelitis ([place]) 06/21/2019   NICM (nonischemic cardiomyopathy) ([place]) 06/20/2019   Non-healing ulcer ([place]) 06/20/2019   CHF (congestive heart failure) ([place]) 11/26/2018   Coagulation disorder ([place]) 08/09/2017   Depression 07/21/2017   At risk for adverse drug reaction 06/20/2017   Peripheral neuropathy 06/20/2017   S/P transmetatarsal amputation of foot, right ([place]) 06/05/2017   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 ([place]) 05/19/2017   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[place])    SIRS (systemic inflammatory response syndrome) ([facility]) 04/06/2017   Suspected sleep apnea 11/24/2016   Ulcer of toe of right foot, with necrosis of bone ([place]) 10/27/2016   Ulcer of left lower leg (  [place]) 05/19/2016   Severe obesity (BMI >= 40) ([place]) 02/24/2016   Morbid obesity ([place]) 02/24/2016   Encounter for therapeutic drug monitoring 02/10/2016   Symptomatic bradycardia 01/12/2016   Hypertension associated with diabetes ([place]) 12/22/2015   Chronic combined systolic and diastolic CHF (congestive heart failure) ([place])    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  11/08/2015   Hypokalemia 11/08/2015   Tobacco abuse 10/23/2015   Coronary artery disease    DOE (dyspnea on exertion) 04/29/2015   Diabetes mellitus type 2, uncontrolled 02/08/2015   Influenza A 02/07/2015   Wrist fracture, left, closed, initial encounter 01/29/2015   PAF (paroxysmal atrial fibrillation) ([place]) 01/16/2015   Carotid arterial disease ([place]) 01/16/2015   Claudication ([place]) 01/15/2015   Demand ischemia ([place]) 10/29/2014   Insomnia 02/03/2014   S/P peripheral artery angioplasty - TurboHawk atherectomy; R SFA 09/11/2013    Class: Acute   Leg pain, bilateral 08/19/2013   Hypothyroidism 07/31/2013   Cellulitis 06/13/2013   History of cocaine abuse ([place]) 06/13/2013   Long term current use of anticoagulant therapy 05/20/2013   Alcohol abuse    Narcotic abuse ([place])    Marijuana abuse    Alcoholic cirrhosis ([name])    DM (diabetes mellitus), type 2 with peripheral vascular complications ([name])     Current Outpatient Medications:    ACCU-CHEK GUIDE test strip, USE TO CHECK BLOOD SUGAR FOUR TIMES DAILY, Disp: , Rfl:    acetaminophen (TYLENOL) 500 MG tablet, Take 1,000 mg by mouth every 6 (six) hours as needed for headache (pain)., Disp: , Rfl:    albuterol (VENTOLIN HFA) 108 (90 Base) MCG/ACT inhaler, Inhale 2 puffs into the lungs every 6 (six) hours as needed for wheezing or shortness of breath., Disp: 18 g, Rfl: 3   allopurinol (ZYLOPRIM) 100 MG tablet, TAKE 1 TABLET (100 MG TOTAL) BY MOUTH DAILY(AM), Disp: 30 tablet, Rfl: 0   amiodarone (PACERONE) 200 MG tablet, TAKE 1/2 TABLET (100 MG TOTAL) BY MOUTH DAILY (AM) (Patient taking differently: Take 100 mg by mouth every morning.), Disp: 15 tablet, Rfl: 3   amLODipine (NORVASC) 10 MG tablet, Take 1 tablet (10 mg total) by mouth daily. (Patient taking differently: Take 10 mg by mouth every morning.), Disp: 90 tablet, Rfl: 3   apixaban (ELIQUIS) 5 MG TABS tablet, Take 1 tablet (5 mg total) by mouth 2 (two) times daily., Disp: 60 tablet,  Rfl: 8   Ascorbic Acid (VITAMIN C PO), Take 1 tablet by mouth every morning., Disp: , Rfl:    Aspirin-Salicylamide-Caffeine (BC HEADACHE POWDER PO), Take 1 packet by mouth 4 (four) times daily as needed (pain/headache)., Disp: , Rfl:    atorvastatin (LIPITOR) 40 MG tablet, TAKE 1 TABLET (40 MG TOTAL) BY MOUTH DAILY(BEDTIME), Disp: 30 tablet, Rfl: 0   budesonide-formoterol (SYMBICORT) 160-4.5 MCG/ACT inhaler, Inhale 2 puffs into the lungs 2 (two) times daily., Disp: 1 Inhaler, Rfl: 2   buPROPion (WELLBUTRIN SR) 150 MG 12 hr tablet, TAKE 1 TABLET BY MOUTH DAILY FOR 3 DAYS THEN INCREASE TO 1 TABLET BY MOUTH 2 TIMES DAILY(AM+BEDTIME), Disp: 60 tablet, Rfl: 3   Cholecalciferol (VITAMIN D3 PO), Take 1 capsule by mouth every morning., Disp: , Rfl:    Continuous Blood Gluc Sensor (FREESTYLE LIBRE 2 SENSOR) MISC, USE 1 (ONE) EACH EVERY 2 WEEKS, Disp: 2 each, Rfl: 11   Dulaglutide (TRULICITY) 0.93 GH/8.2XH SOPN, INJECT 0.75 MG INTO THE SKIN EVERY FRIDAY., Disp: 2 mL, Rfl: 0   FEROSUL 325 (65 Fe) MG tablet, TAKE 1 TABLET (324  MG TOTAL) BY MOUTH 2 (TWO) TIMES DAILY AS NEEDED. (AM+BEDTIME), Disp: 60 tablet, Rfl: 0   FLUoxetine (PROZAC) 20 MG capsule, TAKE 1 CAPSULE (20 MG TOTAL) BY MOUTH AT BEDTIME., Disp: 30 capsule, Rfl: 0   folic acid (FOLVITE) 1 MG tablet, TAKE 1 TABLET (1 MG TOTAL) BY MOUTH DAILY. (Patient taking differently: Take 1 mg by mouth every morning.), Disp: 90 tablet, Rfl: 1   hydrALAZINE (APRESOLINE) 50 MG tablet, Take 1.5 tablets (75 mg total) by mouth 3 (three) times daily., Disp: 140 tablet, Rfl: 4   icosapent Ethyl (VASCEPA) 1 g capsule, Take 2 capsules (2 g total) by mouth 2 (two) times daily., Disp: 120 capsule, Rfl: 6   insulin lispro (HUMALOG) 100 UNIT/ML KwikPen, Inject 8 Units into the skin 3 (three) times daily with meals., Disp: 15 mL, Rfl: 0   Insulin Pen Needle (EASY COMFORT PEN NEEDLES) 31G X 5 MM MISC, USE FOUR TIMES A DAY FOR INSULIN ADMINISTRATION, Disp: 100 each, Rfl: 0    JARDIANCE 10 MG TABS tablet, TAKE 1 TABLET (10 MG TOTAL) BY MOUTH DAILY BEFORE BREAKFAST (AM) (Patient taking differently: 10 mg every morning.), Disp: 90 tablet, Rfl: 3   LANTUS SOLOSTAR 100 UNIT/ML Solostar Pen, Inject 30 Units into the skin daily., Disp: 15 mL, Rfl: 0   levothyroxine (SYNTHROID) 50 MCG tablet, TAKE 1 TABLET (50 MCG TOTAL) BY MOUTH DAILY BEFORE BREAKFAST (AM), Disp: 30 tablet, Rfl: 0   losartan (COZAAR) 25 MG tablet, TAKE 2 TABLETS (50 MG TOTAL) BY MOUTH DAILY (AM), Disp: 60 tablet, Rfl: 0   ondansetron (ZOFRAN) 4 MG tablet, Take 1 tablet (4 mg total) by mouth every 8 (eight) hours as needed for nausea or vomiting., Disp: 20 tablet, Rfl: 0   pantoprazole (PROTONIX) 40 MG tablet, TAKE 1 TABLET (40 MG TOTAL) BY MOUTH DAILY(AM), Disp: 30 tablet, Rfl: 0   spironolactone (ALDACTONE) 25 MG tablet, Take 0.5 tablets (12.5 mg total) by mouth daily., Disp: 45 tablet, Rfl: 1   torsemide (DEMADEX) 20 MG tablet, Take 5 tablets (100 mg total) by mouth in the morning AND 4 tablets (80 mg total) every evening. Take 80 mg in am and 60 mg in pm., Disp: 280 tablet, Rfl: 6   Omega-3 Fatty Acids (FISH OIL PO), Take 1 capsule by mouth every morning., Disp: , Rfl:  Allergies  Allergen Reactions   Gabapentin Nausea And Vomiting and Other (See Comments)    POSSIBLE SHAKING   Lyrica [Pregabalin] Other (See Comments)    Shaking       Social History   Socioeconomic History   Marital status: Single    Spouse name: Not on file   Number of children: 1   Years of education: 12   Highest education level: 12th grade  Occupational History   Occupation: disabled  Tobacco Use   Smoking status: Former    Packs/day: 1.00    Years: 44.00    Total pack years: 44.00    Types: E-cigarettes, Cigarettes   Smokeless tobacco: Former    Types: Snuff  Vaping Use   Vaping Use: Former   Devices: 11/26/2018 "stopped months ago"  Substance and Sexual Activity   Alcohol use: Yes    Comment: occ   Drug use:  Yes    Types: "Crack" cocaine, Marijuana, Oxycodone   Sexual activity: Not Currently  Other Topics Concern   Not on file  Social History Narrative   ** Merged History Encounter **       Lives  in [place], in [place] with sister.  They are looking to move but don't have a place to go yet.     Social Determinants of Health   Financial Resource Strain: Low Risk  (09/11/2020)   Overall Financial Resource Strain (CARDIA)    Difficulty of Paying Living Expenses: Not very hard  Food Insecurity: No Food Insecurity (01/28/2020)   Hunger Vital Sign    Worried About Running Out of Food in the Last Year: Never true    Ran Out of Food in the Last Year: Never true  Transportation Needs: No Transportation Needs (01/28/2020)   PRAPARE - Hydrologist (Medical): No    Lack of Transportation (Non-Medical): No  Physical Activity: Insufficiently Active (01/28/2020)   Exercise Vital Sign    Days of Exercise per Week: 1 day    Minutes of Exercise per Session: 10 min  Stress: Not on file  Social Connections: Not on file  Intimate Partner Violence: Not on file    Physical Exam      Future Appointments  Date Time Provider [place]  07/07/2022 11:10 AM Argentina Donovan, PA-C [facility] None  07/08/2022  2:00 PM [facility] PA/NP [facility] None  07/29/2022 12:45 PM Gardiner Barefoot, DPM [facility]  01/11/2023  9:00 AM [facility] VASC 3 [facility] [place], EMT-Paramedic [phone] [facility] Paramedic  06/30/22

## 2022-06-30 NOTE — Telephone Encounter (Signed)
Attempted to reach pt regarding missed clinic visit last week and to sch home visit.  Phone went straight to VM.    Marylouise Stacks, [place] 06/06/2022

## 2022-07-01 ENCOUNTER — Telehealth: Payer: Self-pay | Admitting: Family Medicine

## 2022-07-01 NOTE — Telephone Encounter (Signed)
Pt has been informed of appointment.

## 2022-07-01 NOTE — Telephone Encounter (Signed)
Copied from [place] [id number]. Topic: Appointment Scheduling - Scheduling Inquiry for Clinic >> Jul 01, 2022 10:34 AM Tiffany B wrote: Reason for CRM: patient received a call regarding scheduling a medicare wellness appointment with PCP

## 2022-07-04 ENCOUNTER — Other Ambulatory Visit: Payer: Self-pay | Admitting: Family Medicine

## 2022-07-04 ENCOUNTER — Other Ambulatory Visit (HOSPITAL_COMMUNITY): Payer: Self-pay | Admitting: Family Medicine

## 2022-07-04 DIAGNOSIS — F419 Anxiety disorder, unspecified: Secondary | ICD-10-CM

## 2022-07-04 DIAGNOSIS — I110 Hypertensive heart disease with heart failure: Secondary | ICD-10-CM

## 2022-07-04 DIAGNOSIS — E039 Hypothyroidism, unspecified: Secondary | ICD-10-CM

## 2022-07-04 IMAGING — CT CT CERVICAL SPINE W/O CM
3 of 4 series · 14 of 33 positions shown, 17 images · non-contrast
Comparison: CT examination dated September 13, 2021

CLINICAL DATA: Neck trauma. Midline tenderness. Patient fell about
3 weeks ago.

EXAM:
CT CERVICAL SPINE WITHOUT CONTRAST
TECHNIQUE: Multidetector CT imaging of the cervical spine was performed without
intravenous contrast. Multiplanar CT image reconstructions were also
generated.

[Series 5: orthogonal bone · axial · 0.29mm/px · z∈[-297,-172]mm · 6 of 99 slices shown, 8 images]
[im 15/99  soft-tissue]
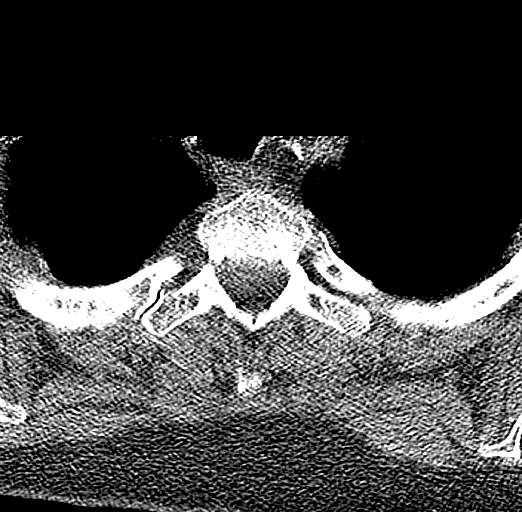
[im 15/99  bone]
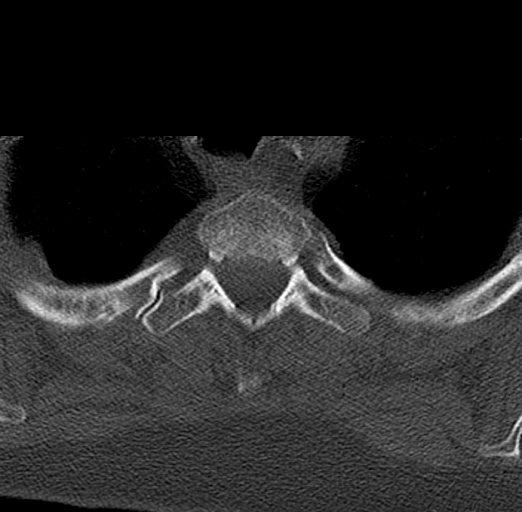
[im 29/99  bone]
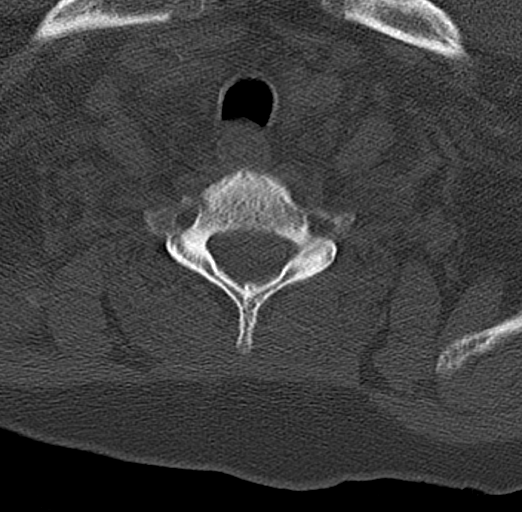
[im 43/99  bone]
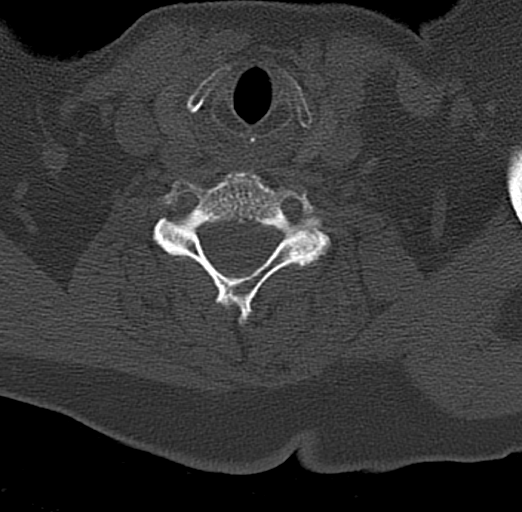
[im 57/99  bone]
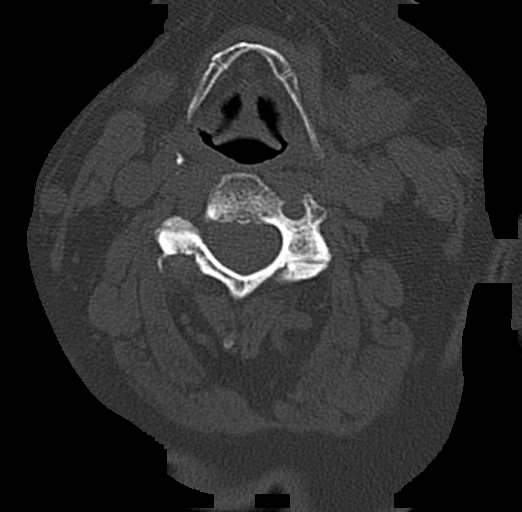
[im 71/99  soft-tissue]
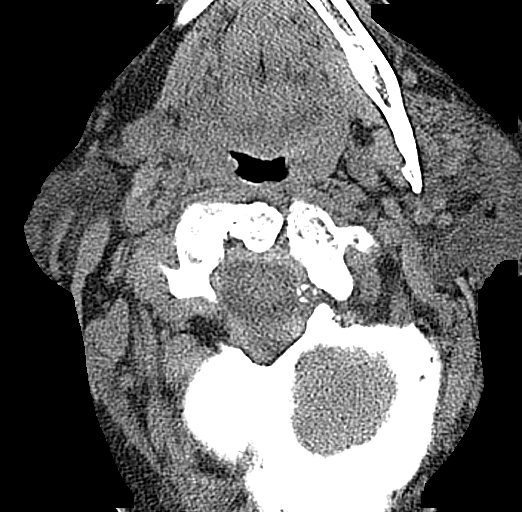
[im 71/99  bone]
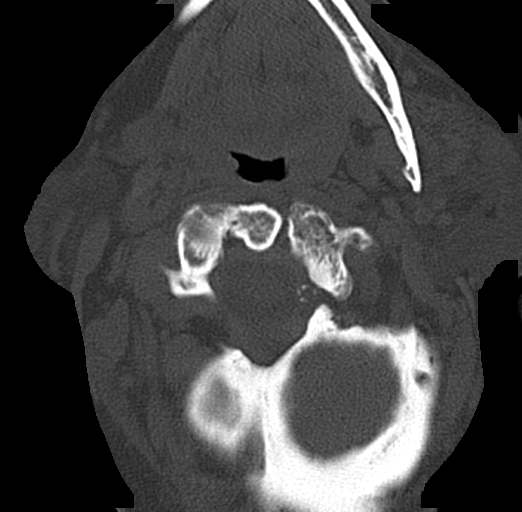
[im 85/99  bone]
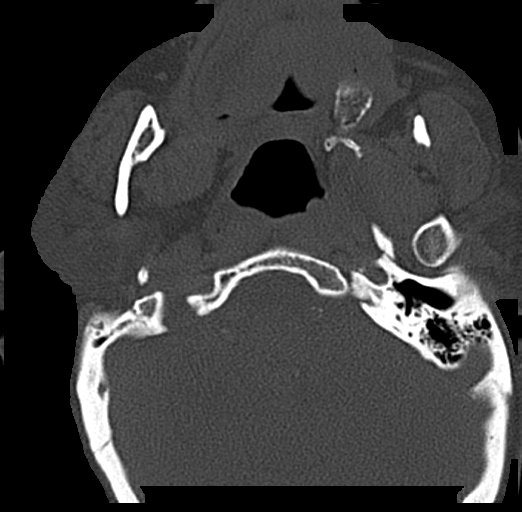

[Series 6: coronal bone · coronal · 0.28mm/px · 3 of 64 slices shown]
[im 14/64  bone]
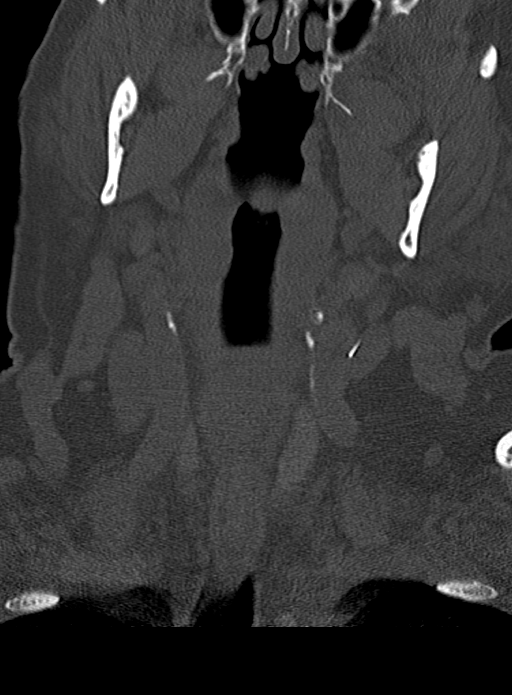
[im 26/64  bone]
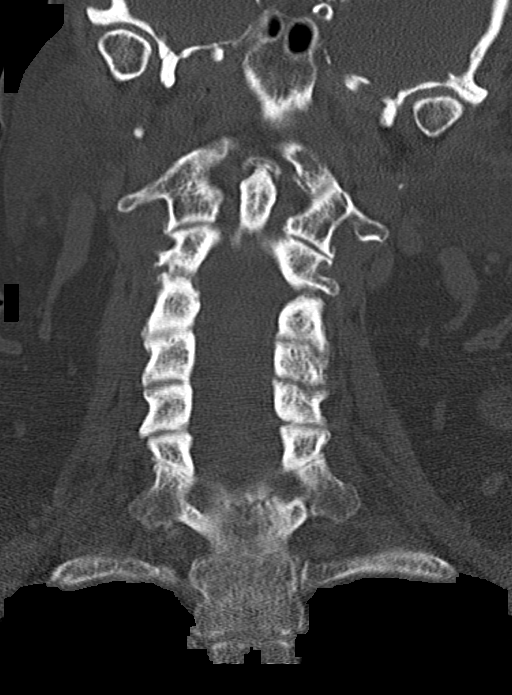
[im 38/64  bone]
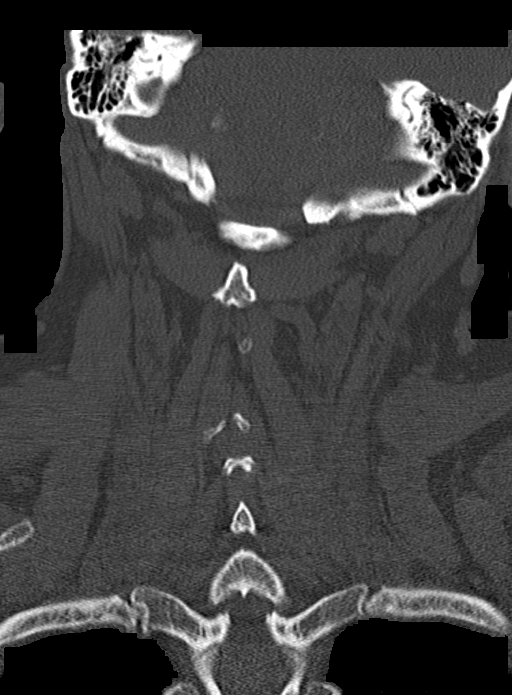

[Series 7: sagittal bone · sagittal · 0.33mm/px · 5 of 61 slices shown, 6 images]
[im 21/61  bone]
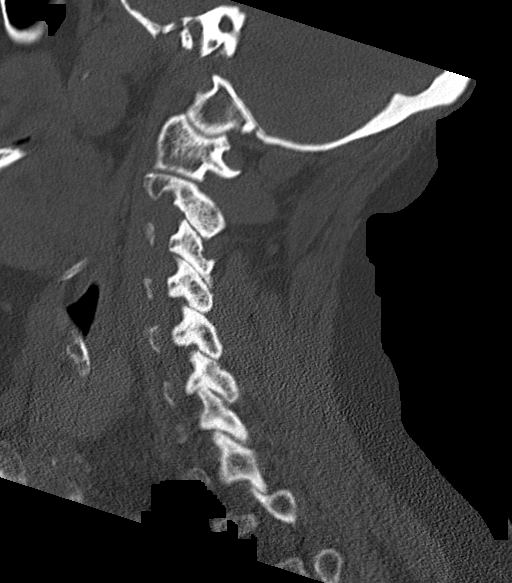
[im 26/61  bone]
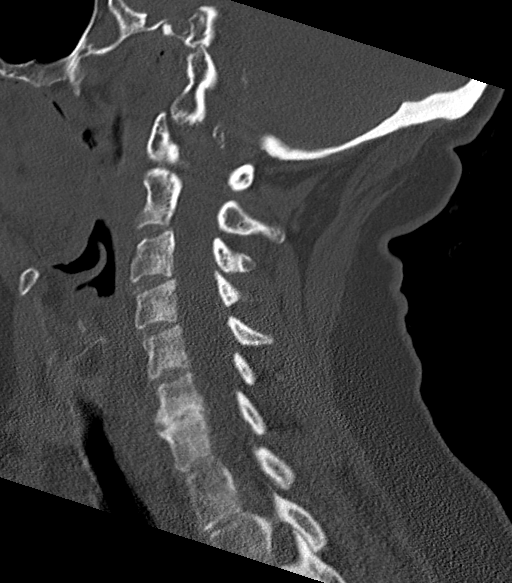
[im 31/61  soft-tissue]
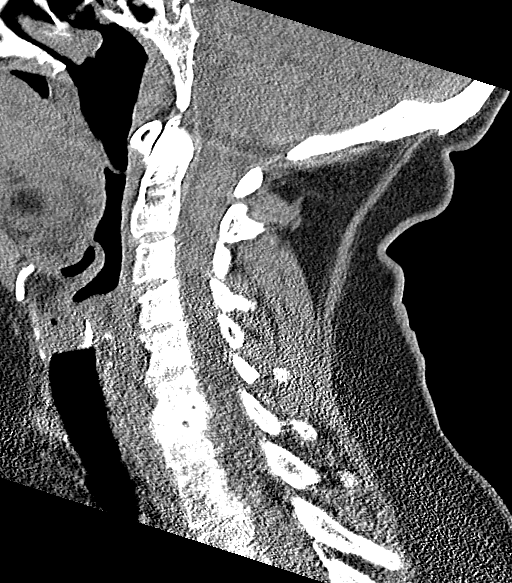
[im 31/61  bone]
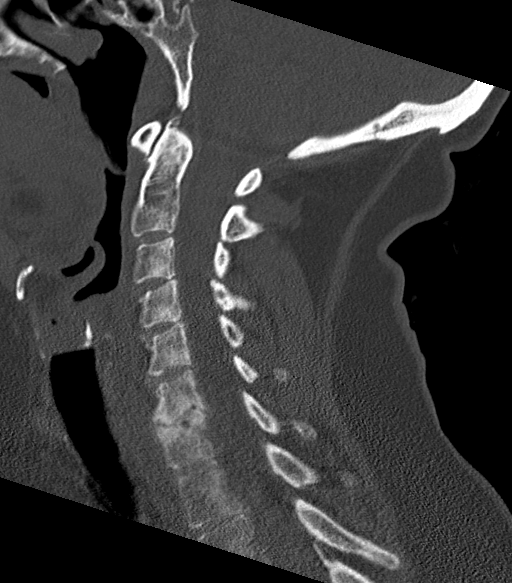
[im 36/61  bone]
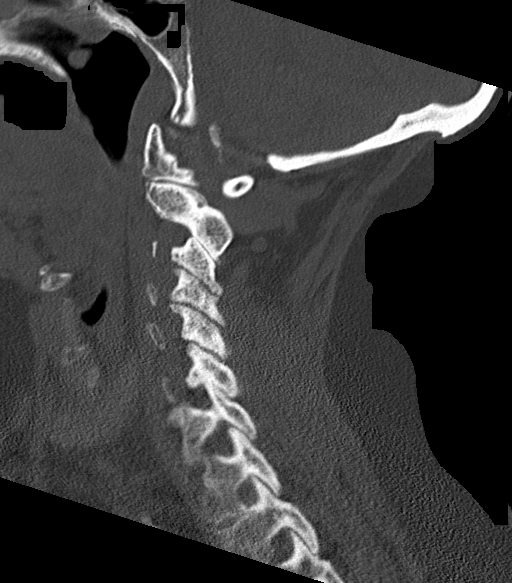
[im 41/61  bone]
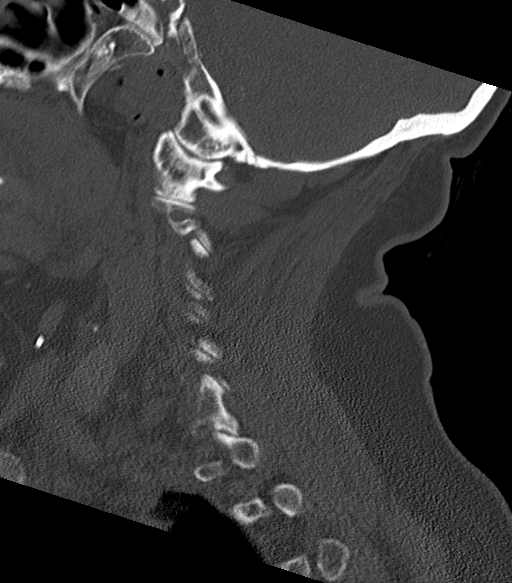

[14 of 33 positions shown; findings below may reference images not displayed]

FINDINGS: Alignment: Normal.

Skull base and vertebrae: No acute fracture. No primary bone lesion
or focal pathologic process.

Soft tissues and spinal canal: No prevertebral fluid or swelling. No
visible canal hematoma.

Disc levels: Multilevel DA and disc disease. Disc space narrowing
and subchondral sclerosis at C6-C7. Uncovertebral joint hypertrophy
at C6-C7.

Upper chest: Negative.

Other: None.
IMPRESSION: 1.  No acute fracture or subluxation.

2.  Degenerate disc disease most prominent at C6-C7.

3. Evaluation of ligamentous injury is limited on CT scan. MRI
examination could be considered if symptoms persist.

## 2022-07-04 IMAGING — CR DG TIBIA/FIBULA 2V*L*
2 series · 2 of 2 positions shown · non-contrast
Comparison: Left tibia and fibula x-ray 09/12/2021.

CLINICAL DATA: Fall, concern for abscess and infection.

EXAM:
LEFT TIBIA AND FIBULA - 2 VIEW

[x tib-fib ap left]
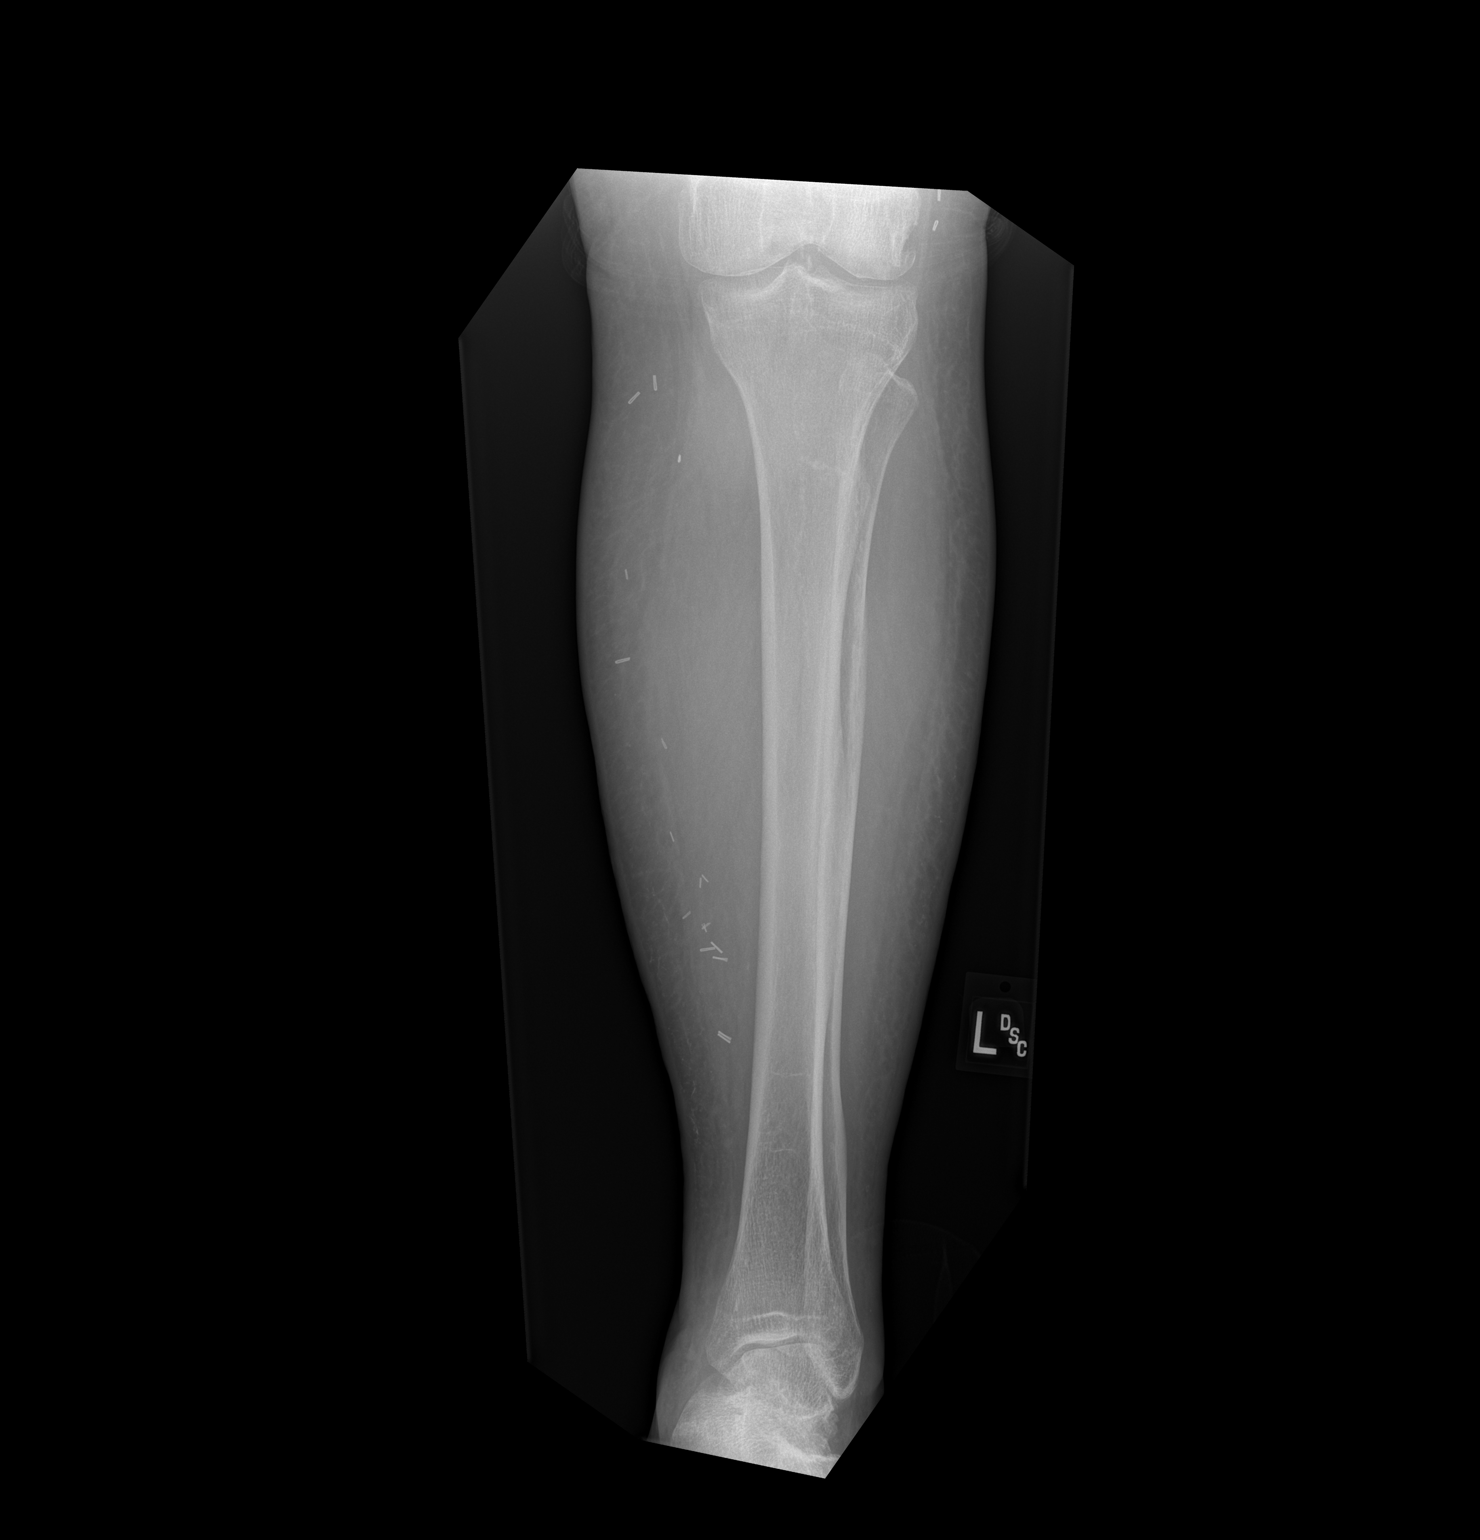

[x tib-fib lat left]
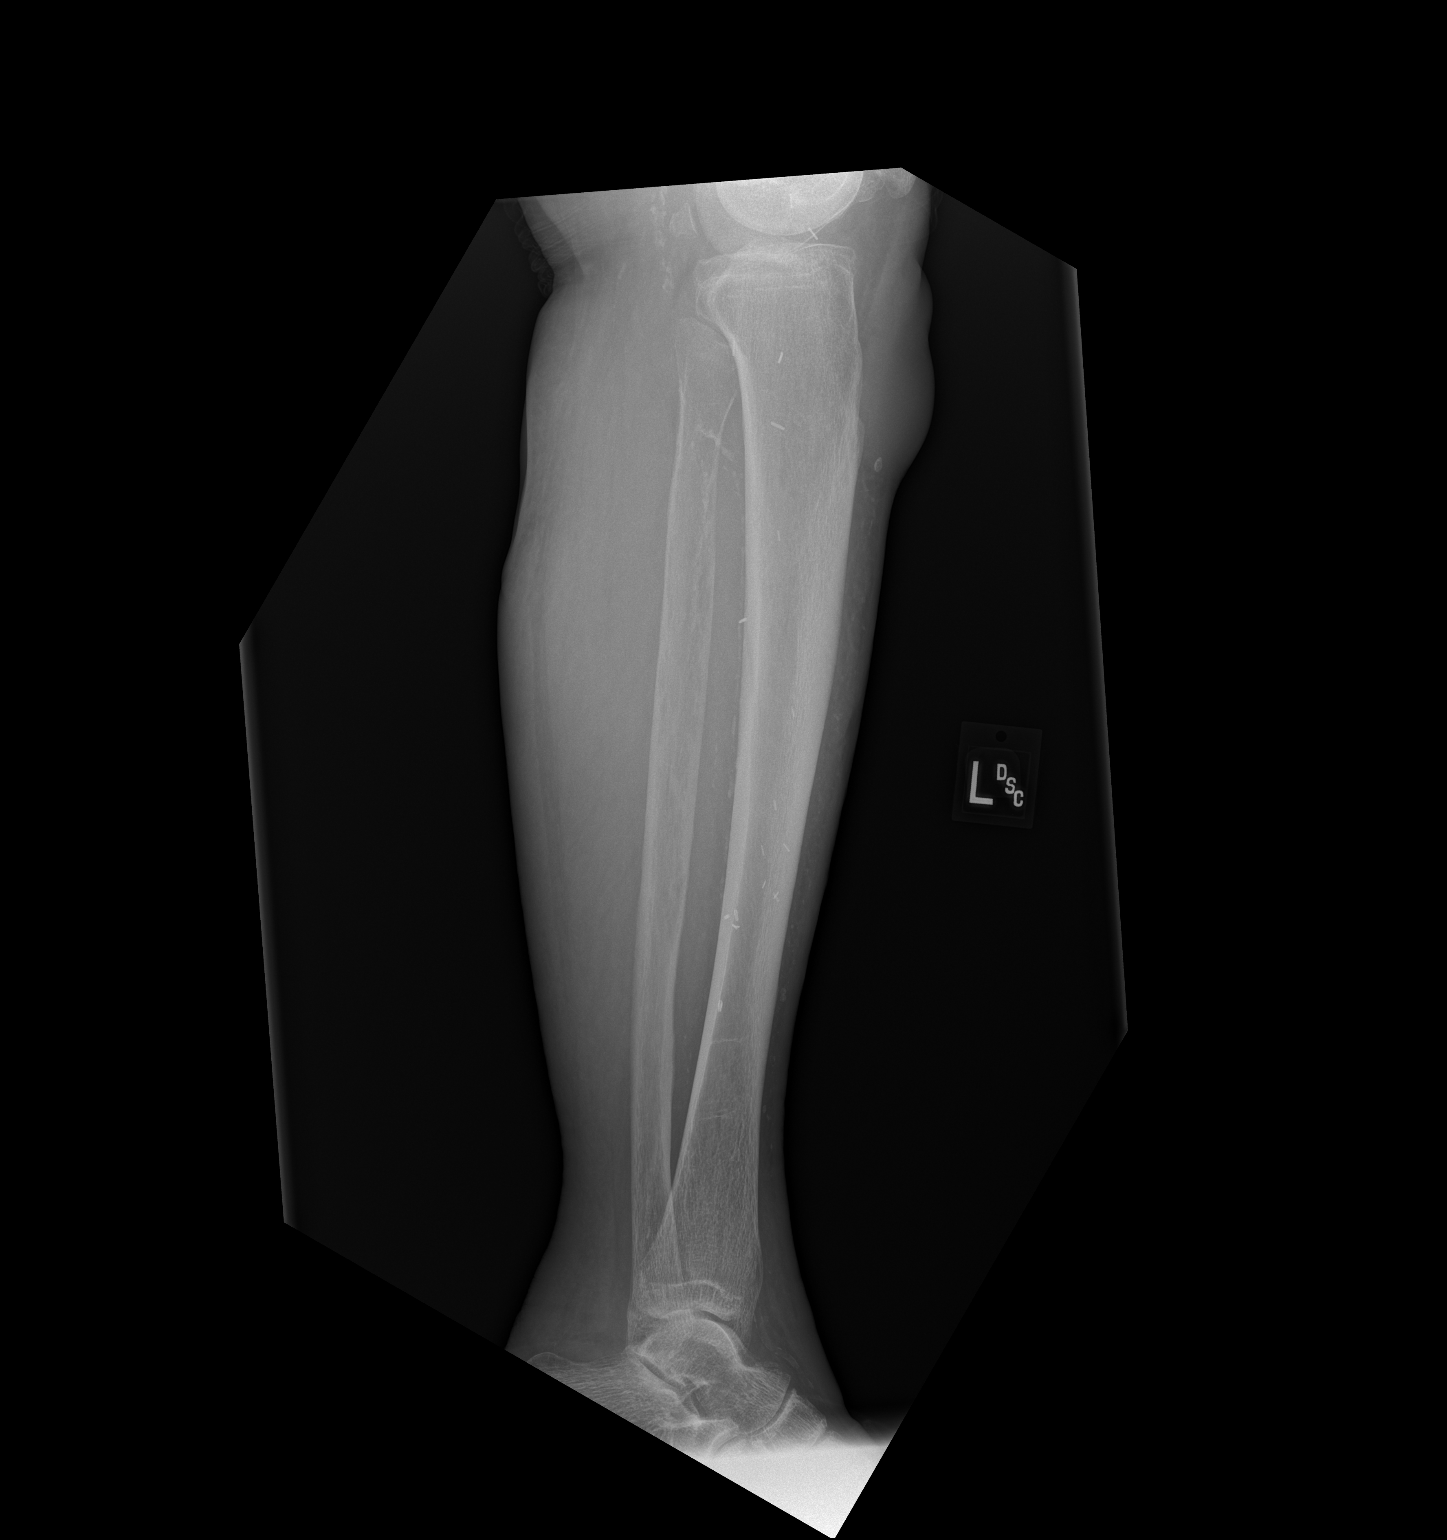

[2 of 2 positions shown; findings below may reference images not displayed]

FINDINGS: There is no evidence of fracture or other focal bone lesions. There
is soft tissue swelling of the lower extremity. Multiple surgical
clips are seen in the soft tissues.
IMPRESSION: No acute bony abnormality.

## 2022-07-04 IMAGING — CR DG CHEST 2V
2 series · 2 of 2 positions shown · non-contrast
Comparison: Chest radiograph 08/10/2021

CLINICAL DATA: Fall 3 weeks ago and 3 days ago

EXAM:
CHEST - 2 VIEW

[w chest lat]
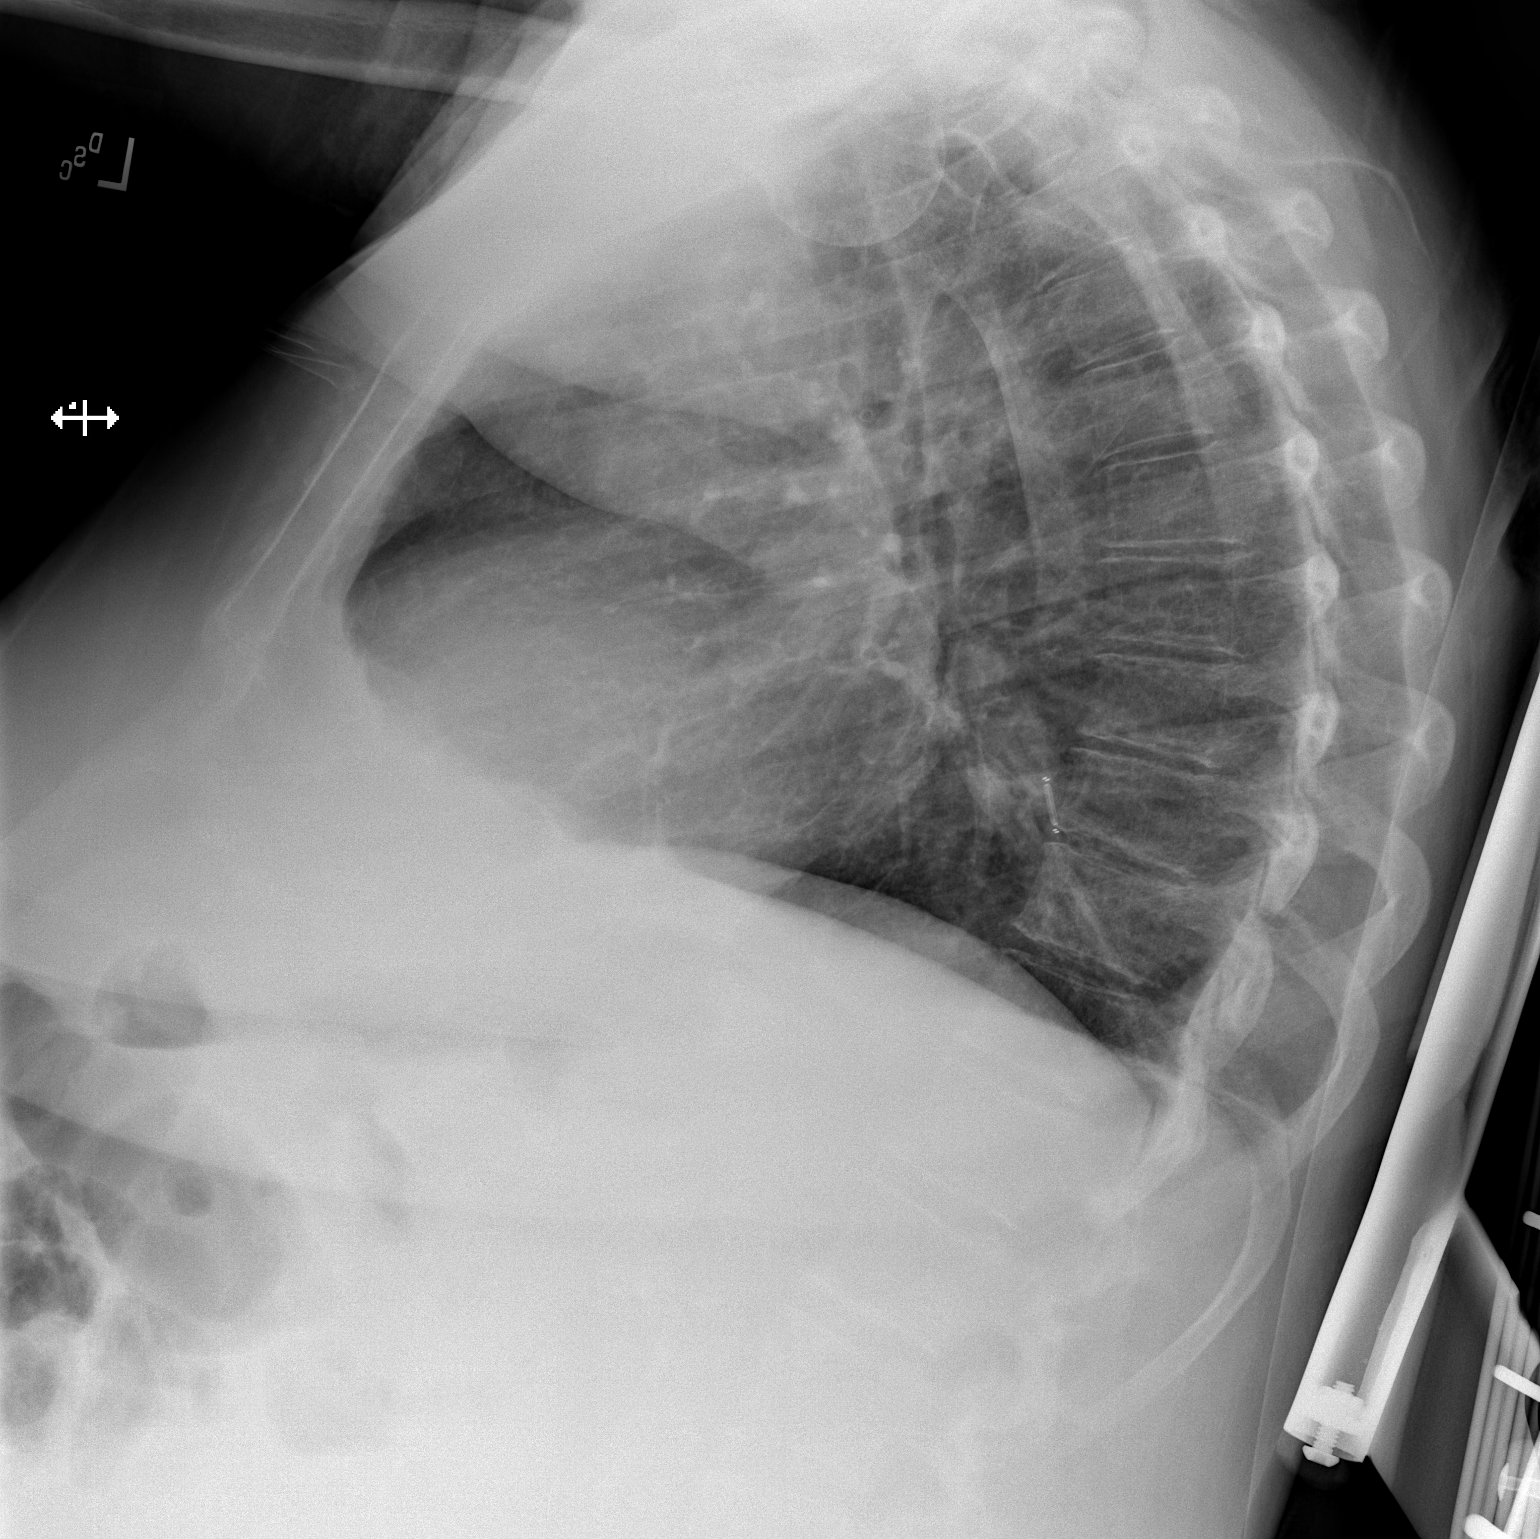

[x chest ap]
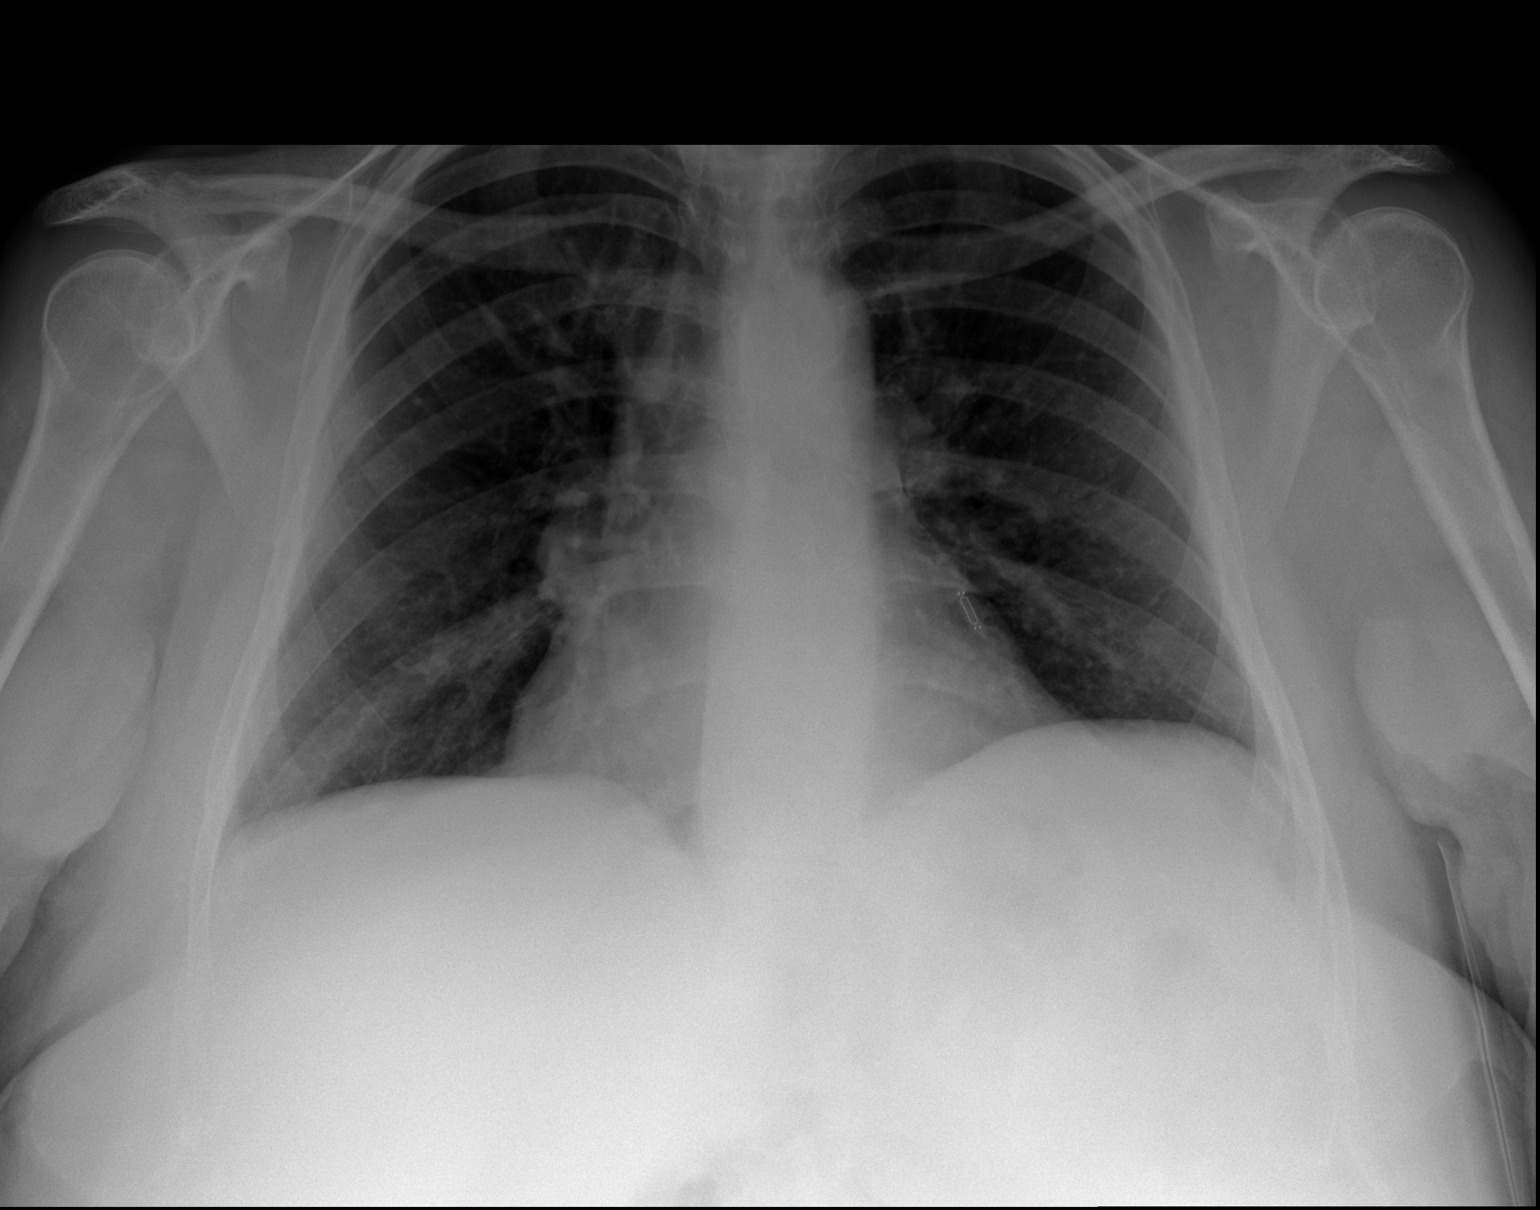

[2 of 2 positions shown; findings below may reference images not displayed]

FINDINGS: The cardiomediastinal silhouette is stable. A metallic density
object projecting over the left infrahilar region is unchanged, and
again may reflect a pulmonary artery pressure monitor.

There is no focal consolidation or pulmonary edema. There is no
pleural effusion or pneumothorax.

There is no acute osseous abnormality.
IMPRESSION: Stable chest with no radiographic evidence of acute cardiopulmonary
process.

## 2022-07-04 NOTE — Telephone Encounter (Signed)
Please review current medication list. Patient requesting Humalog. Last ordered 01/05/2021

## 2022-07-04 NOTE — Telephone Encounter (Signed)
Medication Refill - Medication: Humalog injections  Has the patient contacted their pharmacy? Yes.   (Agent: If no, request that the patient contact the pharmacy for the refill. If patient does not wish to contact the pharmacy document the reason why and proceed with request.) (Agent: If yes, when and what did the pharmacy advise?)  Preferred Pharmacy (with phone number or street name): [facility] Has the patient been seen for an appointment in the last year OR does the patient have an upcoming appointment? Yes.    Agent: Please be advised that RX refills may take up to 3 business days. We ask that you follow-up with your pharmacy.

## 2022-07-04 NOTE — Telephone Encounter (Signed)
[name] with [name] called asking for refill on patents  Pantoprazole 40 mg Ferrous Sulfate 325 mg Allopurinol 100 mg Fluoxetine 20 mg capsul Losartin 25 mg Atorvastatin 40 mg Levothyroxine 50 mc

## 2022-07-05 MED ORDER — INSULIN LISPRO (1 UNIT DIAL) 100 UNIT/ML (KWIKPEN): 8.0000 [IU] | PEN_INJECTOR | Freq: Three times a day (TID) | SUBCUTANEOUS | 0 refills | Status: DC

## 2022-07-05 MED ORDER — ATORVASTATIN CALCIUM 40 MG PO TABS: ORAL_TABLET | ORAL | 0 refills | Status: DC

## 2022-07-05 MED ORDER — LEVOTHYROXINE SODIUM 50 MCG PO TABS: ORAL_TABLET | ORAL | 0 refills | Status: DC

## 2022-07-05 NOTE — Telephone Encounter (Signed)
Requested medication (s) are due for refill today - yes  Requested medication (s) are on the active medication list -yes  Future visit scheduled -yes  Last refill: 01/05/21 27ml  Notes to clinic: Outside provider- hospital provider Rx- sent for office review   Requested Prescriptions  Pending Prescriptions Disp Refills   insulin lispro (HUMALOG) 100 UNIT/ML KwikPen 15 mL 0    Sig: Inject 8 Units into the skin 3 (three) times daily with meals.     Endocrinology:  Diabetes - Insulins Failed - 07/04/2022 11:11 AM      Failed - HBA1C is between 0 and 7.9 and within 180 days    Hemoglobin A1C  Date Value Ref Range Status  06/21/2017 9.8  Final   HbA1c, POC (controlled diabetic range)  Date Value Ref Range Status  08/11/2021 10.5 (A) 0.0 - 7.0 % Final   Hgb A1c MFr Bld  Date Value Ref Range Status  04/18/2022 10.3 (H) 4.8 - 5.6 % Final    Comment:    (NOTE) Pre diabetes:          5.7%-6.4%  Diabetes:              >6.4%  Glycemic control for   <7.0% adults with diabetes          Failed - Valid encounter within last 6 months    Recent Outpatient Visits           7 months ago DM (diabetes mellitus), type 2 with peripheral vascular complications ([place])   [place], [name], MD   9 months ago DM (diabetes mellitus), type 2 with peripheral vascular complications [facility])   [place], [place], [place]   10 months ago DM (diabetes mellitus), type 2 with peripheral vascular complications ([place])   [place], [place], MD   11 months ago DM (diabetes mellitus), type 2 with peripheral vascular complications [facility])   [place], [place], MD   6 years ago Type 2 diabetes mellitus with hyperglycemia   [place], [place], MD       Future Appointments             In 2 days Argentina Donovan, [place]  [place]               Requested Prescriptions  Pending Prescriptions Disp Refills   insulin lispro (HUMALOG) 100 UNIT/ML KwikPen 15 mL 0    Sig: Inject 8 Units into the skin 3 (three) times daily with meals.     Endocrinology:  Diabetes - Insulins Failed - 07/04/2022 11:11 AM      Failed - HBA1C is between 0 and 7.9 and within 180 days    Hemoglobin A1C  Date Value Ref Range Status  06/21/2017 9.8  Final   HbA1c, POC (controlled diabetic range)  Date Value Ref Range Status  08/11/2021 10.5 (A) 0.0 - 7.0 % Final   Hgb A1c MFr Bld  Date Value Ref Range Status  04/18/2022 10.3 (H) 4.8 - 5.6 % Final    Comment:    (NOTE) Pre diabetes:          5.7%-6.4%  Diabetes:              >6.4%  Glycemic control for   <7.0% adults with diabetes  Failed - Valid encounter within last 6 months    Recent Outpatient Visits           7 months ago DM (diabetes mellitus), type 2 with peripheral vascular complications ([place])   [place], [place], MD   9 months ago DM (diabetes mellitus), type 2 with peripheral vascular complications [facility])   [place], [place], [place]   10 months ago DM (diabetes mellitus), type 2 with peripheral vascular complications [facility])   [place], [place], MD   11 months ago DM (diabetes mellitus), type 2 with peripheral vascular complications [facility])   [place], Charlane Ferretti, MD   6 years ago Type 2 diabetes mellitus with hyperglycemia   [place], [name], MD       Future Appointments             In 2 days [place], [place]

## 2022-07-05 NOTE — Telephone Encounter (Signed)
Requested medication (s) are due for refill today: yes  Requested medication (s) are on the active medication list: yes  Last refill:  06/07/22 for each courtesy refill  Future visit scheduled: in 2 days  Notes to clinic:  overdue lab work and needs to keep upcoming OV for further refills   Requested Prescriptions  Pending Prescriptions Disp Refills   pantoprazole (PROTONIX) 40 MG tablet 30 tablet     Sig: TAKE 1 TABLET (40 MG TOTAL) BY MOUTH DAILY(AM)     Gastroenterology: Proton Pump Inhibitors Passed - 07/04/2022 11:27 AM      Passed - Valid encounter within last 12 months    Recent Outpatient Visits           7 months ago DM (diabetes mellitus), type 2 with peripheral vascular complications ([place])   [place], Shiro, MD   9 months ago DM (diabetes mellitus), type 2 with peripheral vascular complications [facility])   [place], [place], [place]   10 months ago DM (diabetes mellitus), type 2 with peripheral vascular complications ([place])   [place], Claiborne, MD   11 months ago DM (diabetes mellitus), type 2 with peripheral vascular complications [facility])   [place], [place], MD   6 years ago Type 2 diabetes mellitus with hyperglycemia   [place], Charlane Ferretti, MD       Future Appointments             In 2 days [place] Donovan, [place]             ferrous sulfate (FEROSUL) 325 (65 FE) MG tablet 60 tablet     Sig: TAKE 1 TABLET (324 MG TOTAL) BY MOUTH 2 (TWO) TIMES DAILY AS NEEDED. (AM+BEDTIME)     Endocrinology:  Minerals - Iron Supplementation Failed - 07/04/2022 11:27 AM      Failed - HGB in normal range and within 360 days    Hemoglobin  Date Value Ref Range Status  04/21/2022 11.0 (L) 12.0 - 15.0 g/dL Final  05/25/2021 10.5 (L) 11.1 - 15.9  g/dL Final   Total hemoglobin  Date Value Ref Range Status  12/03/2015 9.6 (L) 12.0 - 16.0 g/dL Final         Failed - HCT in normal range and within 360 days    HCT  Date Value Ref Range Status  04/21/2022 33.8 (L) 36.0 - 46.0 % Final   Hematocrit  Date Value Ref Range Status  05/25/2021 35.5 34.0 - 46.6 % Final         Failed - RBC in normal range and within 360 days    RBC  Date Value Ref Range Status  04/21/2022 3.58 (L) 3.87 - 5.11 MIL/uL Final         Failed - Fe (serum) in normal range and within 360 days    Iron  Date Value Ref Range Status  11/29/2018 38 28 - 170 ug/dL Final   Saturation Ratios  Date Value Ref Range Status  11/29/2018 9 (L) 10.4 - 31.8 % Final         Failed - Ferritin in normal range and within 360 days    Ferritin  Date Value Ref Range Status  11/29/2018 61 11 - 307 ng/mL Final    Comment:    Performed at [facility]  Lab, [address], [place], [place] [address]         Passed - Valid encounter within last 12 months    Recent Outpatient Visits           7 months ago DM (diabetes mellitus), type 2 with peripheral vascular complications ([place])   [place], Leary, MD   9 months ago DM (diabetes mellitus), type 2 with peripheral vascular complications [facility])   [place], Levada Dy M, [place]   10 months ago DM (diabetes mellitus), type 2 with peripheral vascular complications ([place])   [place], Charlane Ferretti, MD   11 months ago DM (diabetes mellitus), type 2 with peripheral vascular complications ([place])   [place], Charlane Ferretti, MD   6 years ago Type 2 diabetes mellitus with hyperglycemia   [place], Charlane Ferretti, MD       Future Appointments             In 2 days Argentina Donovan, PA-C [place]              allopurinol (ZYLOPRIM) 100 MG tablet 30 tablet     Sig: TAKE 1 TABLET (100 MG TOTAL) BY MOUTH DAILY(AM)     Endocrinology:  Gout Agents - allopurinol Failed - 07/04/2022 11:27 AM      Failed - Uric Acid in normal range and within 360 days    Uric Acid  Date Value Ref Range Status  08/11/2021 10.3 (H) 3.0 - 7.2 mg/dL Final    Comment:               Therapeutic target for gout patients: <6.0         Failed - Cr in normal range and within 360 days    Creat  Date Value Ref Range Status  01/25/2016 1.09 (H) 0.50 - 1.05 mg/dL Final   Creatinine, Ser  Date Value Ref Range Status  05/12/2022 1.59 (H) 0.44 - 1.00 mg/dL Final   Creatinine, Urine  Date Value Ref Range Status  08/18/2018 72.60 mg/dL Final    Comment:    Performed at [facility], [place] [address] [place], [place] [address]         Passed - Valid encounter within last 12 months    Recent Outpatient Visits           7 months ago DM (diabetes mellitus), type 2 with peripheral vascular complications ([place])   [place], [place], MD   9 months ago DM (diabetes mellitus), type 2 with peripheral vascular complications [facility])   [place], Levada Dy M, [place]   10 months ago DM (diabetes mellitus), type 2 with peripheral vascular complications [facility])   [place], [place], MD   11 months ago DM (diabetes mellitus), type 2 with peripheral vascular complications [facility])   [place], Charlane Ferretti, MD   6 years ago Type 2 diabetes mellitus with hyperglycemia   [place], [name], MD       Future Appointments             In 2 days [place], Casimer Bilis [place] -  CBC within normal limits and completed in the last 12 months    WBC  Date Value Ref Range Status  04/21/2022 8.6 4.0 - 10.5  K/uL Final   RBC  Date Value Ref Range Status  04/21/2022 3.58 (L) 3.87 - 5.11 MIL/uL Final   Hemoglobin  Date Value Ref Range Status  04/21/2022 11.0 (L) 12.0 - 15.0 g/dL Final  05/25/2021 10.5 (L) 11.1 - 15.9 g/dL Final   Total hemoglobin  Date Value Ref Range Status  12/03/2015 9.6 (L) 12.0 - 16.0 g/dL Final   HCT  Date Value Ref Range Status  04/21/2022 33.8 (L) 36.0 - 46.0 % Final   Hematocrit  Date Value Ref Range Status  05/25/2021 35.5 34.0 - 46.6 % Final   MCHC  Date Value Ref Range Status  04/21/2022 32.5 30.0 - 36.0 g/dL Final   [facility]  Date Value Ref Range Status  04/21/2022 30.7 26.0 - 34.0 pg Final   MCV  Date Value Ref Range Status  04/21/2022 94.4 80.0 - 100.0 fL Final  05/25/2021 88 79 - 97 fL Final   No results found for: "PLTCOUNTKUC", "LABPLAT", "POCPLA" RDW  Date Value Ref Range Status  04/21/2022 13.3 11.5 - 15.5 % Final  05/25/2021 14.1 11.7 - 15.4 % Final          FLUoxetine (PROZAC) 20 MG capsule 30 capsule     Sig: Take 1 capsule (20 mg total) by mouth at bedtime.     Psychiatry:  Antidepressants - SSRI Failed - 07/04/2022 11:27 AM      Failed - Completed PHQ-2 or PHQ-9 in the last 360 days      Failed - Valid encounter within last 6 months    Recent Outpatient Visits           7 months ago DM (diabetes mellitus), type 2 with peripheral vascular complications ([place])   [place], [place], MD   9 months ago DM (diabetes mellitus), type 2 with peripheral vascular complications [facility])   [place], Levada Dy M, [place]   10 months ago DM (diabetes mellitus), type 2 with peripheral vascular complications ([place])   [name], [place], MD   11 months ago DM (diabetes mellitus), type 2 with peripheral vascular complications ([place])   [place], [place], MD   6 years ago Type 2 diabetes mellitus  with hyperglycemia   [place], Charlane Ferretti, MD       Future Appointments             In 2 days [place], Dionne Bucy, PA-C [place]             losartan (COZAAR) 25 MG tablet 60 tablet     Sig: TAKE 2 TABLETS (50 MG TOTAL) BY MOUTH DAILY (AM)     Cardiovascular:  Angiotensin Receptor Blockers Failed - 07/04/2022 11:27 AM      Failed - Cr in normal range and within 180 days    Creat  Date Value Ref Range Status  01/25/2016 1.09 (H) 0.50 - 1.05 mg/dL Final   Creatinine, Ser  Date Value Ref Range Status  05/12/2022 1.59 (H) 0.44 - 1.00 mg/dL Final   Creatinine, Urine  Date Value Ref Range Status  08/18/2018 72.60 mg/dL Final    Comment:    Performed at [facility], [place]  [address] [place], [place] [address]         Failed - Last BP in normal range    BP Readings from Last 1 Encounters:  06/30/22 (!) 142/64         Failed - Valid encounter within last 6 months    Recent Outpatient Visits           7 months ago DM (diabetes mellitus), type 2 with peripheral vascular complications ([place])   [place], Charlane Ferretti, MD   9 months ago DM (diabetes mellitus), type 2 with peripheral vascular complications [facility])   [place], Levada Dy M, [place]   10 months ago DM (diabetes mellitus), type 2 with peripheral vascular complications ([place])   [place], Charlane Ferretti, MD   11 months ago DM (diabetes mellitus), type 2 with peripheral vascular complications ([place])   [place], Charlane Ferretti, MD   6 years ago Type 2 diabetes mellitus with hyperglycemia   [place], Charlane Ferretti, MD       Future Appointments             In 2 days Argentina Donovan, PA-C [place] - K in normal range and  within 180 days    Potassium  Date Value Ref Range Status  05/12/2022 4.0 3.5 - 5.1 mmol/L Final         Passed - Patient is not pregnant      Signed Prescriptions Disp Refills   atorvastatin (LIPITOR) 40 MG tablet 30 tablet 0    Sig: TAKE 1 TABLET (40 MG TOTAL) BY MOUTH DAILY(BEDTIME)     Cardiovascular:  Antilipid - Statins Failed - 07/04/2022 11:27 AM      Failed - Lipid Panel in normal range within the last 12 months    Cholesterol  Date Value Ref Range Status  04/04/2022 198 0 - 200 mg/dL Final   LDL Cholesterol  Date Value Ref Range Status  04/04/2022 UNABLE TO CALCULATE IF TRIGLYCERIDE OVER 400 mg/dL 0 - 99 mg/dL Final    Comment:           Total Cholesterol/HDL:CHD Risk Coronary Heart Disease Risk Table                     Men   Women  1/2 Average Risk   3.4   3.3  Average Risk       5.0   4.4  2 X Average Risk   9.6   7.1  3 X Average Risk  23.4   11.0        Use the calculated Patient Ratio above and the CHD Risk Table to determine the patient's CHD Risk.        ATP III CLASSIFICATION (LDL):  <100     mg/dL   Optimal  100-129  mg/dL   Near or Above                    Optimal  130-159  mg/dL   Borderline  160-189  mg/dL   High  >190     mg/dL   Very High Performed at [place] [address], [place],  [address]    Direct LDL  Date Value Ref Range Status  04/04/2022 90.8 0 -  99 mg/dL Final    Comment:    Performed at [facility], [place] [address] [place], [name] [address]   HDL  Date Value Ref Range Status  04/04/2022 54 >40 mg/dL Final   Triglycerides  Date Value Ref Range Status  04/04/2022 405 (H) <150 mg/dL Final         Passed - Patient is not pregnant      Passed - Valid encounter within last 12 months    Recent Outpatient Visits           7 months ago DM (diabetes mellitus), type 2 with peripheral vascular complications ([place])   [place], [place], MD   9 months ago DM  (diabetes mellitus), type 2 with peripheral vascular complications [facility])   [place], Levada Dy M, [place]   10 months ago DM (diabetes mellitus), type 2 with peripheral vascular complications [facility])   [place], Pierce, MD   11 months ago DM (diabetes mellitus), type 2 with peripheral vascular complications [facility])   [place], Charlane Ferretti, MD   6 years ago Type 2 diabetes mellitus with hyperglycemia   [place], [name], MD       Future Appointments             In 2 days Argentina Donovan, [place]             levothyroxine (SYNTHROID) 50 MCG tablet 30 tablet 0    Sig: TAKE 1 TABLET (50 MCG TOTAL) BY MOUTH DAILY BEFORE BREAKFAST (AM)     Endocrinology:  Hypothyroid Agents Passed - 07/04/2022 11:27 AM      Passed - TSH in normal range and within 360 days    TSH  Date Value Ref Range Status  04/04/2022 3.109 0.350 - 4.500 uIU/mL Final    Comment:    Performed by a 3rd Generation assay with a functional sensitivity of <=0.01 uIU/mL. Performed at [facility], [place] [address], [place], [place] [address]   09/23/2021 3.370 0.450 - 4.500 uIU/mL Final         Passed - Valid encounter within last 12 months    Recent Outpatient Visits           7 months ago DM (diabetes mellitus), type 2 with peripheral vascular complications ([place])   [place], [place], MD   9 months ago DM (diabetes mellitus), type 2 with peripheral vascular complications [facility])   [place], [place], [place]   10 months ago DM (diabetes mellitus), type 2 with peripheral vascular complications [facility])   [place], [place], MD   11 months ago DM (diabetes mellitus), type 2 with peripheral vascular complications [facility])    [place], Charlane Ferretti, MD   6 years ago Type 2 diabetes mellitus with hyperglycemia   [place], [name], MD       Future Appointments             In 2 days [place], Casimer Bilis [place]

## 2022-07-05 NOTE — Telephone Encounter (Signed)
Requested Prescriptions  Pending Prescriptions Disp Refills  . pantoprazole (PROTONIX) 40 MG tablet 30 tablet     Sig: TAKE 1 TABLET (40 MG TOTAL) BY MOUTH DAILY(AM)     Gastroenterology: Proton Pump Inhibitors Passed - 07/04/2022 11:27 AM      Passed - Valid encounter within last 12 months    Recent Outpatient Visits          7 months ago DM (diabetes mellitus), type 2 with peripheral vascular complications ([place])   [place], [place], MD   9 months ago DM (diabetes mellitus), type 2 with peripheral vascular complications [facility])   [place], [place], [place]   10 months ago DM (diabetes mellitus), type 2 with peripheral vascular complications [facility])   [place], [place], MD   11 months ago DM (diabetes mellitus), type 2 with peripheral vascular complications [facility])   [place], Charlane Ferretti, MD   6 years ago Type 2 diabetes mellitus with hyperglycemia   [place], [name], MD      Future Appointments            In 2 days [place], Casimer Bilis [place]           . ferrous sulfate (FEROSUL) 325 (65 FE) MG tablet 60 tablet     Sig: TAKE 1 TABLET (324 MG TOTAL) BY MOUTH 2 (TWO) TIMES DAILY AS NEEDED. (AM+BEDTIME)     Endocrinology:  Minerals - Iron Supplementation Failed - 07/04/2022 11:27 AM      Failed - HGB in normal range and within 360 days    Hemoglobin  Date Value Ref Range Status  04/21/2022 11.0 (L) 12.0 - 15.0 g/dL Final  05/25/2021 10.5 (L) 11.1 - 15.9 g/dL Final   Total hemoglobin  Date Value Ref Range Status  12/03/2015 9.6 (L) 12.0 - 16.0 g/dL Final         Failed - HCT in normal range and within 360 days    HCT  Date Value Ref Range Status  04/21/2022 33.8 (L) 36.0 - 46.0 % Final   Hematocrit  Date Value Ref Range Status  05/25/2021 35.5  34.0 - 46.6 % Final         Failed - RBC in normal range and within 360 days    RBC  Date Value Ref Range Status  04/21/2022 3.58 (L) 3.87 - 5.11 MIL/uL Final         Failed - Fe (serum) in normal range and within 360 days    Iron  Date Value Ref Range Status  11/29/2018 38 28 - 170 ug/dL Final   Saturation Ratios  Date Value Ref Range Status  11/29/2018 9 (L) 10.4 - 31.8 % Final         Failed - Ferritin in normal range and within 360 days    Ferritin  Date Value Ref Range Status  11/29/2018 61 11 - 307 ng/mL Final    Comment:    Performed at [facility], [place] [address], [place], [place] [address]         Passed - Valid encounter within last 12 months    Recent Outpatient Visits          7 months ago DM (diabetes mellitus), type 2 with peripheral vascular complications [facility])   [place]  [facility] [place], [place], MD   9 months ago DM (diabetes mellitus), type 2 with peripheral vascular complications [facility])   [place], [place], [place]   10 months ago DM (diabetes mellitus), type 2 with peripheral vascular complications [facility])   [place], [place], MD   11 months ago DM (diabetes mellitus), type 2 with peripheral vascular complications [facility])   [place], Charlane Ferretti, MD   6 years ago Type 2 diabetes mellitus with hyperglycemia   [place], [name], MD      Future Appointments            In 2 days [place], Casimer Bilis [place]           . allopurinol (ZYLOPRIM) 100 MG tablet 30 tablet     Sig: TAKE 1 TABLET (100 MG TOTAL) BY MOUTH DAILY(AM)     Endocrinology:  Gout Agents - allopurinol Failed - 07/04/2022 11:27 AM      Failed - Uric Acid in normal range and within 360 days    Uric Acid  Date Value Ref Range Status  08/11/2021 10.3 (H) 3.0 - 7.2 mg/dL  Final    Comment:               Therapeutic target for gout patients: <6.0         Failed - Cr in normal range and within 360 days    Creat  Date Value Ref Range Status  01/25/2016 1.09 (H) 0.50 - 1.05 mg/dL Final   Creatinine, Ser  Date Value Ref Range Status  05/12/2022 1.59 (H) 0.44 - 1.00 mg/dL Final   Creatinine, Urine  Date Value Ref Range Status  08/18/2018 72.60 mg/dL Final    Comment:    Performed at [facility], [place] [address], [place], [place] [address]         Passed - Valid encounter within last 12 months    Recent Outpatient Visits          7 months ago DM (diabetes mellitus), type 2 with peripheral vascular complications ([place])   [place], Dutton, MD   9 months ago DM (diabetes mellitus), type 2 with peripheral vascular complications [facility])   [place], Levada Dy M, [place]   10 months ago DM (diabetes mellitus), type 2 with peripheral vascular complications [facility])   [place], [place], MD   11 months ago DM (diabetes mellitus), type 2 with peripheral vascular complications [facility])   [place], Charlane Ferretti, MD   6 years ago Type 2 diabetes mellitus with hyperglycemia   [place], [name], MD      Future Appointments            In 2 days [place], Dionne Bucy, PA-C [place] - CBC within normal limits and completed in the last 12 months    WBC  Date Value Ref Range Status  04/21/2022 8.6 4.0 - 10.5 K/uL Final   RBC  Date Value Ref Range Status  04/21/2022 3.58 (L) 3.87 - 5.11 MIL/uL Final   Hemoglobin  Date Value Ref Range Status  04/21/2022 11.0 (L) 12.0 - 15.0 g/dL Final  05/25/2021 10.5 (L) 11.1 - 15.9 g/dL Final   Total hemoglobin  Date Value Ref Range Status  12/03/2015 9.6 (L) 12.0 - 16.0 g/dL Final   HCT   Date Value Ref Range Status  04/21/2022 33.8 (L) 36.0 - 46.0 % Final   Hematocrit  Date Value Ref Range Status  05/25/2021 35.5 34.0 - 46.6 % Final   MCHC  Date Value Ref Range Status  04/21/2022 32.5 30.0 - 36.0 g/dL Final   [facility]  Date Value Ref Range Status  04/21/2022 30.7 26.0 - 34.0 pg Final   MCV  Date Value Ref Range Status  04/21/2022 94.4 80.0 - 100.0 fL Final  05/25/2021 88 79 - 97 fL Final   No results found for: "PLTCOUNTKUC", "LABPLAT", "POCPLA" RDW  Date Value Ref Range Status  04/21/2022 13.3 11.5 - 15.5 % Final  05/25/2021 14.1 11.7 - 15.4 % Final         . FLUoxetine (PROZAC) 20 MG capsule 30 capsule     Sig: Take 1 capsule (20 mg total) by mouth at bedtime.     Psychiatry:  Antidepressants - SSRI Failed - 07/04/2022 11:27 AM      Failed - Completed PHQ-2 or PHQ-9 in the last 360 days      Failed - Valid encounter within last 6 months    Recent Outpatient Visits          7 months ago DM (diabetes mellitus), type 2 with peripheral vascular complications ([place])   [place], Garfield, MD   9 months ago DM (diabetes mellitus), type 2 with peripheral vascular complications [facility])   [place], [place], [place]   10 months ago DM (diabetes mellitus), type 2 with peripheral vascular complications [facility])   [place], [place], MD   11 months ago DM (diabetes mellitus), type 2 with peripheral vascular complications [facility])   [place], Charlane Ferretti, MD   6 years ago Type 2 diabetes mellitus with hyperglycemia   [place], [name], MD      Future Appointments            In 2 days [place], Angela M, [place]           . losartan (COZAAR) 25 MG tablet 60 tablet     Sig: TAKE 2 TABLETS (50 MG TOTAL) BY MOUTH DAILY (AM)      Cardiovascular:  Angiotensin Receptor Blockers Failed - 07/04/2022 11:27 AM      Failed - Cr in normal range and within 180 days    Creat  Date Value Ref Range Status  01/25/2016 1.09 (H) 0.50 - 1.05 mg/dL Final   Creatinine, Ser  Date Value Ref Range Status  05/12/2022 1.59 (H) 0.44 - 1.00 mg/dL Final   Creatinine, Urine  Date Value Ref Range Status  08/18/2018 72.60 mg/dL Final    Comment:    Performed at [place] [address], [place], [place] [address]         Failed - Last BP in normal range    BP Readings from Last 1 Encounters:  06/30/22 (!) 142/64         Failed - Valid encounter within last 6 months    Recent Outpatient Visits          7 months ago DM (diabetes mellitus),  type 2 with peripheral vascular complications [facility])   [place], [place], MD   9 months ago DM (diabetes mellitus), type 2 with peripheral vascular complications [facility])   [place], Levada Dy M, [place]   10 months ago DM (diabetes mellitus), type 2 with peripheral vascular complications [facility])   [place], Garn[place], MD   11 months ago DM (diabetes mellitus), type 2 with peripheral vascular complications [facility])   [place], Charlane Ferretti, MD   6 years ago Type 2 diabetes mellitus with hyperglycemia   [place], [name], MD      Future Appointments            In 2 days Math[place] Dad Tat [place] - K in normal range and within 180 days    Potassium  Date Value Ref Range Status  05/12/2022 4.0 3.5 - 5.1 mmol/L Final         Passed - Patient is not pregnant      . atorvastatin (LIPITOR) 40 MG tablet 30 tablet 0    Sig: TAKE 1 TABLET (40 MG TOTAL) BY MOUTH DAILY(BEDTIME)     Cardiovascular:  Antilipid - Statins Failed - 07/04/2022 11:27 AM       Failed - Lipid Panel in normal range within the last 12 months    Cholesterol  Date Value Ref Range Status  04/04/2022 198 0 - 200 mg/dL Final   LDL Cholesterol  Date Value Ref Range Status  04/04/2022 UNABLE TO CALCULATE IF TRIGLYCERIDE OVER 400 mg/dL 0 - 99 mg/dL Final    Comment:           Total Cholesterol/HDL:CHD Risk Coronary Heart Disease Risk Table                     Men   Women  1/2 Average Risk   3.4   3.3  Average Risk       5.0   4.4  2 X Average Risk   9.6   7.1  3 X Average Risk  23.4   11.0        Use the calculated Patient Ratio above and the CHD Risk Table to determine the patient's CHD Risk.        ATP III CLASSIFICATION (LDL):  <100     mg/dL   Optimal  100-129  mg/dL   Near or Above                    Optimal  130-159  mg/dL   Borderline  160-189  mg/dL   High  >190     mg/dL   Very High Performed at [place] [address] [place], [place] [address]    Direct LDL  Date Value Ref Range Status  04/04/2022 90.8 0 - 99 mg/dL Final    Comment:    Performed at [place] [address], [place], [place] [address]   HDL  Date Value Ref Range Status  04/04/2022 54 >40 mg/dL Final   Triglycerides  Date Value Ref Range Status  04/04/2022 405 (H) <150 mg/dL Final         Passed - Patient is not pregnant      Passed - Valid encounter within  last 12 months    Recent Outpatient Visits          7 months ago DM (diabetes mellitus), type 2 with peripheral vascular complications ([place])   [place], Sealy, MD   9 months ago DM (diabetes mellitus), type 2 with peripheral vascular complications [facility])   [place], Levada Dy M, [place]   10 months ago DM (diabetes mellitus), type 2 with peripheral vascular complications [facility])   [place], [place], MD   11 months ago DM (diabetes mellitus), type 2 with peripheral vascular  complications [facility])   [place], Charlane Ferretti, MD   6 years ago Type 2 diabetes mellitus with hyperglycemia   [place], [name], MD      Future Appointments            In 2 days Thereasa Solo, Casimer Bilis [place]           . levothyroxine (SYNTHROID) 50 MCG tablet 30 tablet 0    Sig: TAKE 1 TABLET (50 MCG TOTAL) BY MOUTH DAILY BEFORE BREAKFAST (AM)     Endocrinology:  Hypothyroid Agents Passed - 07/04/2022 11:27 AM      Passed - TSH in normal range and within 360 days    TSH  Date Value Ref Range Status  04/04/2022 3.109 0.350 - 4.500 uIU/mL Final    Comment:    Performed by a 3rd Generation assay with a functional sensitivity of <=0.01 uIU/mL. Performed at [facility], [place] [address], [place], [place] [address]   09/23/2021 3.370 0.450 - 4.500 uIU/mL Final         Passed - Valid encounter within last 12 months    Recent Outpatient Visits          7 months ago DM (diabetes mellitus), type 2 with peripheral vascular complications ([place])   [place], [place], MD   9 months ago DM (diabetes mellitus), type 2 with peripheral vascular complications [facility])   [place], [place], [place]   10 months ago DM (diabetes mellitus), type 2 with peripheral vascular complications [facility])   [place], [place], MD   11 months ago DM (diabetes mellitus), type 2 with peripheral vascular complications [facility])   [place], Charlane Ferretti, MD   6 years ago Type 2 diabetes mellitus with hyperglycemia   [place], [name], MD      Future Appointments            In 2 days [place], Casimer Bilis [place]

## 2022-07-05 NOTE — Telephone Encounter (Signed)
Pt called to report that she is out of her current supply, please advise

## 2022-07-05 NOTE — Telephone Encounter (Signed)
Requested by interface surescripts. Last refilled 07/04/22. Requested Prescriptions  Refused Prescriptions Disp Refills  . folic acid (FOLVITE) 1 MG tablet [Pharmacy Med Name: FOLIC ACID 1 MG ORAL TABLET] 90 tablet 1    Sig: TAKE 1 TABLET (1 MG TOTAL) BY MOUTH DAILY (AM)     Endocrinology:  Vitamins Passed - 07/04/2022 10:45 AM      Passed - Valid encounter within last 12 months    Recent Outpatient Visits          7 months ago DM (diabetes mellitus), type 2 with peripheral vascular complications ([place])   [place], [place], MD   9 months ago DM (diabetes mellitus), type 2 with peripheral vascular complications [facility])   [place], [place], [place]   10 months ago DM (diabetes mellitus), type 2 with peripheral vascular complications ([place])   [place], Dana, MD   11 months ago DM (diabetes mellitus), type 2 with peripheral vascular complications [facility])   [place], Charlane Ferretti, MD   6 years ago Type 2 diabetes mellitus with hyperglycemia   [place], [name], MD      Future Appointments            In 2 days [place], Casimer Bilis [place]

## 2022-07-07 ENCOUNTER — Encounter: Payer: Self-pay | Admitting: Physician Assistant

## 2022-07-07 ENCOUNTER — Other Ambulatory Visit: Payer: Self-pay | Admitting: Family Medicine

## 2022-07-07 ENCOUNTER — Ambulatory Visit: Payer: Medicare Other | Attending: Physician Assistant | Admitting: Physician Assistant

## 2022-07-07 VITALS — BP 147/82 | HR 69 | Ht 64.0 in | Wt 226.4 lb

## 2022-07-07 DIAGNOSIS — E039 Hypothyroidism, unspecified: Secondary | ICD-10-CM

## 2022-07-07 DIAGNOSIS — Z09 Encounter for follow-up examination after completed treatment for conditions other than malignant neoplasm: Secondary | ICD-10-CM | POA: Diagnosis not present

## 2022-07-07 DIAGNOSIS — R059 Cough, unspecified: Secondary | ICD-10-CM

## 2022-07-07 DIAGNOSIS — Z91199 Patient's noncompliance with other medical treatment and regimen due to unspecified reason: Secondary | ICD-10-CM | POA: Diagnosis not present

## 2022-07-07 DIAGNOSIS — E1151 Type 2 diabetes mellitus with diabetic peripheral angiopathy without gangrene: Secondary | ICD-10-CM | POA: Diagnosis not present

## 2022-07-07 DIAGNOSIS — I10 Essential (primary) hypertension: Secondary | ICD-10-CM | POA: Diagnosis not present

## 2022-07-07 DIAGNOSIS — F419 Anxiety disorder, unspecified: Secondary | ICD-10-CM

## 2022-07-07 DIAGNOSIS — M109 Gout, unspecified: Secondary | ICD-10-CM

## 2022-07-07 LAB — GLUCOSE, POCT (MANUAL RESULT ENTRY): POC Glucose: 228 mg/dl — AB (ref 70–99)

## 2022-07-07 MED ORDER — EMPAGLIFLOZIN 10 MG PO TABS: 10.0000 mg | ORAL_TABLET | Freq: Every day | ORAL | 3 refills | Status: DC

## 2022-07-07 MED ORDER — INSULIN LISPRO (1 UNIT DIAL) 100 UNIT/ML (KWIKPEN): 8.0000 [IU] | PEN_INJECTOR | Freq: Three times a day (TID) | SUBCUTANEOUS | 3 refills | Status: DC

## 2022-07-07 MED ORDER — BUDESONIDE-FORMOTEROL FUMARATE 160-4.5 MCG/ACT IN AERO: 2.0000 | INHALATION_SPRAY | Freq: Two times a day (BID) | RESPIRATORY_TRACT | 2 refills | Status: DC

## 2022-07-07 MED ORDER — AMLODIPINE BESYLATE 10 MG PO TABS: 10.0000 mg | ORAL_TABLET | Freq: Every morning | ORAL | 1 refills | Status: DC

## 2022-07-07 MED ORDER — LEVOTHYROXINE SODIUM 50 MCG PO TABS: ORAL_TABLET | ORAL | 3 refills | Status: DC

## 2022-07-07 MED ORDER — PANTOPRAZOLE SODIUM 40 MG PO TBEC: DELAYED_RELEASE_TABLET | ORAL | 3 refills | Status: DC

## 2022-07-07 MED ORDER — LOSARTAN POTASSIUM 25 MG PO TABS: ORAL_TABLET | ORAL | 3 refills | Status: DC

## 2022-07-07 MED ORDER — TRIAMCINOLONE ACETONIDE 0.1 % EX OINT: TOPICAL_OINTMENT | Freq: Two times a day (BID) | CUTANEOUS | 1 refills | Status: DC | PRN

## 2022-07-07 MED ORDER — EASY COMFORT PEN NEEDLES 31G X 5 MM MISC: 1 refills | Status: DC

## 2022-07-07 MED ORDER — ALLOPURINOL 100 MG PO TABS: ORAL_TABLET | ORAL | 3 refills | Status: DC

## 2022-07-07 MED ORDER — FOLIC ACID 1 MG PO TABS: 1.0000 mg | ORAL_TABLET | Freq: Every day | ORAL | 1 refills | Status: DC

## 2022-07-07 MED ORDER — LANTUS SOLOSTAR 100 UNIT/ML ~~LOC~~ SOPN: 30.0000 [IU] | PEN_INJECTOR | Freq: Every day | SUBCUTANEOUS | 3 refills | Status: DC

## 2022-07-07 MED ORDER — TRULICITY 0.75 MG/0.5ML ~~LOC~~ SOAJ: 0.7500 mg | SUBCUTANEOUS | 3 refills | Status: DC

## 2022-07-07 MED ORDER — ATORVASTATIN CALCIUM 40 MG PO TABS: ORAL_TABLET | ORAL | 3 refills | Status: DC

## 2022-07-07 NOTE — Progress Notes (Signed)
Patient ID: [name], female   DOB: 1961/09/17, 61 y.o.   MRN: [id number]   [name], is a 61 y.o. female  ZJI:[id number]  [id number]  DOB - December 07, 1960  Chief Complaint  Patient presents with   Medication Refill   Diabetes   Hypertension       Subjective:   [name] is a 61 y.o. female here today for med RF.  She has  not followed up here in a while.  She had a fairly recent hospitalization 6/4-6/9 and did not f/up with PCP.  She has not been taking diabetes meds regularly.  Last A1C 2 months ago=10.3.    From recent hospitalization: Brief Hospital Course: Sepsis due to viral gastroenteritis: Sepsis physiology has resolved-nausea/vomiting and diarrhea have completely resolved.  Leukocytosis has resolved-stool work-up negative for any infection.   Hypokalemia: Due to GI loss-repleted.   Hyponatremia: Mild-watch closely.   HFpEF: Reasonably well compensated-tolerating initiation of diuretics well-and back on her usual diuretic regimen.   PAF: Continue amiodarone and Eliquis   History of junctional bradycardia: No longer on Cardizem/beta-blocker.  Continue telemetry monitoring   HTN: BP stable-continue amlodipine-should be able to resume hydralazine and losartan on discharge as well.   HLD: Continue statin   PAD-s/p right TMA   History of carotid stenosis-s/p left CEA   Right leg swelling: Erythema/swelling spontaneously has improved-RLE Doppler negative for DVT.  Suspect-slightly worsening edema may have caused erythema.   Mechanical fall: Occurred on 6/8-this occurred while see attempted to grab her undergarments from the closet.  She is completely awake and alert-she claims to have hit her right shoulder-x-ray was negative for any fractures.  Pain is significantly better this morning-she has good range of motion.  On exam shoulder is without any swelling.   CKD stage IIIb: Close to baseline-follow periodically.   DM-2 (A1c 10.3 on 6/5): CBG slowly  creeping up-we will resume her usual insulin regimen/oral hypoglycemic regimen on discharge.     No problems updated.  ALLERGIES: Allergies  Allergen Reactions   Gabapentin Nausea And Vomiting and Other (See Comments)    POSSIBLE SHAKING   Lyrica [Pregabalin] Other (See Comments)    Shaking     PAST MEDICAL HISTORY: Past Medical History:  Diagnosis Date   Alcohol abuse    Alcoholic cirrhosis ([place])    Anemia    Anxiety    Arthritis    "knees" (11/26/2018)   B12 deficiency    CAD (coronary artery disease)    a. 11/10/2014 Cath: LM nl, LAD min irregs, D1 30 ost, D2 50d, LCX 69m, OM1 80 p/m (1.5 mm vessel), OM2 51m, RCA nondom 31m-->med rx.. Demand ischemia in the setting of rapid a-fib.   Cardiomyopathy ([place])    Carotid artery disease ([place])    a. 01/2015 Carotid Angio: RICA 824, LICA 23N; b. 01/6143 s/p L CEA; c. 05/2019 Carotid U/S: RICA 100. RECA >50. LICA 3-15%.   Cellulitis    lower extremities   CHF (congestive heart failure) ([name])    Chronic combined systolic and diastolic CHF (congestive heart failure) ([place])    a. 10/2014 Echo: EF 40-45%; b. 10/2018 Echo: EF 45-50%, gr2 DD; c. 11/2019 Echo: EF 50%, mild LVH, gr2 DD (restrictive), antlat HK, Nl RV fxn. Mild BAE. RVSP 59.36mmHg.   CKD (chronic kidney disease), stage III ([name])    Cocaine abuse ([place])    COPD (chronic obstructive pulmonary disease) ([place])    Critical lower limb ischemia ([name])    Depression  Diabetes mellitus without complication ([place])    Diabetic peripheral neuropathy ([name])    DVT (deep venous thrombosis) ([name])    Dyspnea    Elevated troponin    a. Chronic elevation.   GERD (gastroesophageal reflux disease)    Hyperlipemia    Hypertension    Hypokalemia    Hypomagnesemia    Hypothyroidism    Marijuana abuse    Narcotic abuse ([place])    Noncompliance    NSVT (nonsustained ventricular tachycardia) ([name])    Obesity    PAF (paroxysmal atrial fibrillation) ([name])    Paroxysmal atrial tachycardia ([name])     Peripheral arterial disease ([place])    a. 01/2015 Angio/PTA: RSFA 99 (atherectomy/pta) - 1 vessel runoff via diff dzs peroneal; b. 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   Pneumonia    "once or twice" (11/26/2018)   Poorly controlled type 2 diabetes mellitus ([place])    Renal disorder    Renal insufficiency    a. 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   Sleep apnea    "couldn't handle wearing the mask" (11/26/2018)   Symptomatic bradycardia    a. Avoid AV blocking agent per EP. Prev req temp wire in 2017.   Tobacco abuse     MEDICATIONS AT HOME: Prior to Admission medications   Medication Sig Start Date End Date Taking? Authorizing Provider  triamcinolone 0.1% oint-Cerave equivalent lotion 1:1 mixture Apply topically 2 (two) times daily as needed. 07/07/22  Yes Emmert Roethler, Dionne Bucy, PA-C  ACCU-CHEK GUIDE test strip USE TO CHECK BLOOD SUGAR FOUR TIMES DAILY 02/25/20   [provider]  acetaminophen (TYLENOL) 500 MG tablet Take 1,000 mg by mouth every 6 (six) hours as needed for headache (pain).    [provider]  albuterol (VENTOLIN HFA) 108 (90 Base) MCG/ACT inhaler Inhale 2 puffs into the lungs every 6 (six) hours as needed for wheezing or shortness of breath. 07/30/21   Edwin Dada, MD  allopurinol (ZYLOPRIM) 100 MG tablet TAKE 1 TABLET (100 MG TOTAL) BY MOUTH DAILY(AM) 07/07/22   Ranson Belluomini, Dionne Bucy, PA-C  amiodarone (PACERONE) 200 MG tablet TAKE 1/2 TABLET (100 MG TOTAL) BY MOUTH DAILY (AM) Patient taking differently: Take 100 mg by mouth every morning. 04/07/22   Larey Dresser, MD  amLODipine (NORVASC) 10 MG tablet Take 1 tablet (10 mg total) by mouth every morning. 07/07/22   Argentina Donovan, PA-C  apixaban (ELIQUIS) 5 MG TABS tablet Take 1 tablet (5 mg total) by mouth 2 (two) times daily. 12/10/21   Larey Dresser, MD  Ascorbic Acid (VITAMIN C PO) Take 1 tablet by mouth every morning.    [provider]  Aspirin-Salicylamide-Caffeine (BC HEADACHE  POWDER PO) Take 1 packet by mouth 4 (four) times daily as needed (pain/headache).    [provider]  atorvastatin (LIPITOR) 40 MG tablet TAKE 1 TABLET (40 MG TOTAL) BY MOUTH DAILY(BEDTIME) 07/07/22   Argentina Donovan, PA-C  budesonide-formoterol (SYMBICORT) 160-4.5 MCG/ACT inhaler Inhale 2 puffs into the lungs 2 (two) times daily. 07/07/22   Argentina Donovan, PA-C  buPROPion (WELLBUTRIN SR) 150 MG 12 hr tablet TAKE 1 TABLET BY MOUTH DAILY FOR 3 DAYS THEN INCREASE TO 1 TABLET BY MOUTH 2 TIMES DAILY(AM+BEDTIME) 05/09/22   Larey Dresser, MD  Cholecalciferol (VITAMIN D3 PO) Take 1 capsule by mouth every morning.    [provider]  Continuous Blood Gluc Sensor (FREESTYLE LIBRE 2 SENSOR) MISC USE 1 (ONE) EACH  EVERY 2 WEEKS 07/14/21   Charlott Rakes, MD  Dulaglutide (TRULICITY) 1.66 AY/3.0ZS SOPN Inject 0.75 mg into the skin every Friday. 07/08/22   Argentina Donovan, PA-C  empagliflozin (JARDIANCE) 10 MG TABS tablet Take 1 tablet (10 mg total) by mouth daily. TAKE 1 TABLET before breakfast daily 07/07/22   Argentina Donovan, PA-C  FEROSUL 325 (65 Fe) MG tablet TAKE 1 TABLET (324 MG TOTAL) BY MOUTH 2 (TWO) TIMES DAILY AS NEEDED. (AM+BEDTIME) 06/07/22   Charlott Rakes, MD  FLUoxetine (PROZAC) 20 MG capsule TAKE 1 CAPSULE (20 MG TOTAL) BY MOUTH AT BEDTIME. 06/07/22   Charlott Rakes, MD  folic acid (FOLVITE) 1 MG tablet Take 1 tablet (1 mg total) by mouth daily. 07/07/22   Argentina Donovan, PA-C  hydrALAZINE (APRESOLINE) 50 MG tablet TAKE 1 & 1/2 TABLETS (75 MG TOTAL) BY MOUTH 3 (THREE) TIMES DAILY (AM+NOON+BEDTIME) 07/04/22   [place], Maricela Bo, FNP  icosapent Ethyl (VASCEPA) 1 g capsule Take 2 capsules (2 g total) by mouth 2 (two) times daily. 05/05/22   Lyda Jester M, PA-C  insulin lispro (HUMALOG) 100 UNIT/ML KwikPen Inject 8 Units into the skin 3 (three) times daily with meals. 07/07/22   Argentina Donovan, PA-C  Insulin Pen Needle (EASY COMFORT PEN NEEDLES) 31G X 5 MM MISC USE FOUR  TIMES A DAY FOR INSULIN ADMINISTRATION 07/07/22   Freeman Caldron M, PA-C  LANTUS SOLOSTAR 100 UNIT/ML Solostar Pen Inject 30 Units into the skin daily. 07/07/22   Argentina Donovan, PA-C  levothyroxine (SYNTHROID) 50 MCG tablet TAKE 1 TABLET (50 MCG TOTAL) BY MOUTH DAILY BEFORE BREAKFAST (AM) 07/07/22   Thereasa Solo, Dionne Bucy, PA-C  losartan (COZAAR) 25 MG tablet TAKE 2 TABLETS (50 MG TOTAL) BY MOUTH DAILY (AM) 07/07/22   Robecca Fulgham, Dionne Bucy, PA-C  Omega-3 Fatty Acids (FISH OIL PO) Take 1 capsule by mouth every morning.    [provider]  ondansetron (ZOFRAN) 4 MG tablet Take 1 tablet (4 mg total) by mouth every 8 (eight) hours as needed for nausea or vomiting. 08/11/21   Charlott Rakes, MD  pantoprazole (PROTONIX) 40 MG tablet TAKE 1 TABLET (40 MG TOTAL) BY MOUTH DAILY(AM) 07/07/22   Kerman Pfost, Dionne Bucy, PA-C  spironolactone (ALDACTONE) 25 MG tablet Take 0.5 tablets (12.5 mg total) by mouth daily. 05/06/22   Lyda Jester M, PA-C  torsemide (DEMADEX) 20 MG tablet Take 5 tablets (100 mg total) by mouth in the morning AND 4 tablets (80 mg total) every evening. Take 80 mg in am and 60 mg in pm. 05/05/22   Lyda Jester M, PA-C  tiotropium (SPIRIVA HANDIHALER) 18 MCG inhalation capsule Place 1 capsule (18 mcg total) into inhaler and inhale every morning. Patient not taking: Reported on 02/09/2021 12/09/19 02/09/21  Eugenie Filler, MD    ROS: Neg HEENT Neg resp Neg cardiac Neg GI Neg GU Neg MS Neg psych Neg neuro  Objective:   Vitals:   07/07/22 1128  BP: (!) 147/82  Pulse: 69  SpO2: 97%  Weight: 226 lb 6.4 oz (102.7 kg)  Height: 5\' 4"  (1.626 m)   Exam General appearance : Awake, alert, not in any distress. Speech Clear. Not toxic looking.  In wheel chair HEENT: Atraumatic and Normocephalic Neck: Supple, no JVD. No cervical lymphadenopathy.  Chest: Good air entry bilaterally, CTAB.  No rales/rhonchi/wheezing CVS: S1 S2 regular, no murmurs.  Neurology: Awake alert, and  oriented X 3, CN II-XII intact, Non focal Skin: No Rash  Data Review  Lab Results  Component Value Date   HGBA1C 10.3 (H) 04/18/2022   HGBA1C 10.5 (A) 08/11/2021   HGBA1C 9.7 (H) 05/04/2021    Assessment & Plan   1. DM (diabetes mellitus), type 2 with peripheral vascular complications ([name]) Uncontrolled but non compliant with f/up and meds Check blood sugars daily and record fasting and at bedtime. Bring this to your next appt - Glucose (CBG) - atorvastatin (LIPITOR) 40 MG tablet; TAKE 1 TABLET (40 MG TOTAL) BY MOUTH DAILY(BEDTIME)  Dispense: 30 tablet; Refill: 3 - Dulaglutide (TRULICITY) 5.10 CH/8.5ID SOPN; Inject 0.75 mg into the skin every Friday.  Dispense: 2 mL; Refill: 3 - insulin lispro (HUMALOG) 100 UNIT/ML KwikPen; Inject 8 Units into the skin 3 (three) times daily with meals.  Dispense: 15 mL; Refill: 3 - Insulin Pen Needle (EASY COMFORT PEN NEEDLES) 31G X 5 MM MISC; USE FOUR TIMES A DAY FOR INSULIN ADMINISTRATION  Dispense: 100 each; Refill: 1 - LANTUS SOLOSTAR 100 UNIT/ML Solostar Pen; Inject 30 Units into the skin daily.  Dispense: 15 mL; Refill: 3 - empagliflozin (JARDIANCE) 10 MG TABS tablet; Take 1 tablet (10 mg total) by mouth daily. TAKE 1 TABLET before breakfast daily  Dispense: 90 tablet; Refill: 3 - Comprehensive metabolic panel  2. Hypothyroidism, unspecified type - levothyroxine (SYNTHROID) 50 MCG tablet; TAKE 1 TABLET (50 MCG TOTAL) BY MOUTH DAILY BEFORE BREAKFAST (AM)  Dispense: 30 tablet; Refill: 3  3. Essential hypertension Take meds! - losartan (COZAAR) 25 MG tablet; TAKE 2 TABLETS (50 MG TOTAL) BY MOUTH DAILY (AM)  Dispense: 60 tablet; Refill: 3 - amLODipine (NORVASC) 10 MG tablet; Take 1 tablet (10 mg total) by mouth every morning.  Dispense: 90 tablet; Refill: 1 - Comprehensive metabolic panel  4. Cough, unspecified type She quit smoking 5 months ago!  Congratulated her on this! - budesonide-formoterol (SYMBICORT) 160-4.5 MCG/ACT inhaler; Inhale 2  puffs into the lungs 2 (two) times daily.  Dispense: 1 each; Refill: 2  5. Gout, unspecified cause, unspecified chronicity, unspecified site - allopurinol (ZYLOPRIM) 100 MG tablet; TAKE 1 TABLET (100 MG TOTAL) BY MOUTH DAILY(AM)  Dispense: 30 tablet; Refill: 3  6. Noncompliance Compliance imperative-discussed at length  7. Hospital discharge follow-up    Return for luke in 1 month; Dr Margarita Rana in 3 months(keep annual wellness appt).  The patient was given clear instructions to go to ER or return to medical center if symptoms don't improve, worsen or new problems develop. The patient verbalized understanding. The patient was told to call to get lab results if they haven't heard anything in the next week.      Freeman Caldron, PA-C [facility] [place], [place]   07/07/2022, 12:02 PM

## 2022-07-07 NOTE — Patient Instructions (Addendum)
Check blood sugars daily and record fasting and at bedtime. Bring this to your next appt

## 2022-07-07 NOTE — Telephone Encounter (Signed)
3 rxs were sent to pharmacy by provider after OV today. Requested Prescriptions  Pending Prescriptions Disp Refills  . losartan (COZAAR) 25 MG tablet [Pharmacy Med Name: LOSARTAN POTASSIUM 25 MG ORAL TABLET] 60 tablet 0    Sig: TAKE 2 TABLETS (50 MG TOTAL) BY MOUTH DAILY (AM)     Cardiovascular:  Angiotensin Receptor Blockers Failed - 07/07/2022  9:27 AM      Failed - Cr in normal range and within 180 days    Creat  Date Value Ref Range Status  01/25/2016 1.09 (H) 0.50 - 1.05 mg/dL Final   Creatinine, Ser  Date Value Ref Range Status  05/12/2022 1.59 (H) 0.44 - 1.00 mg/dL Final   Creatinine, Urine  Date Value Ref Range Status  08/18/2018 72.60 mg/dL Final    Comment:    Performed at [facility], [place] [address], [place], [place] [address]         Failed - Last BP in normal range    BP Readings from Last 1 Encounters:  07/07/22 (!) 147/82         Failed - Valid encounter within last 6 months    Recent Outpatient Visits          Today DM (diabetes mellitus), type 2 with peripheral vascular complications [facility])   [place], [place], [place]   7 months ago DM (diabetes mellitus), type 2 with peripheral vascular complications [facility])   [place], [place], MD   9 months ago DM (diabetes mellitus), type 2 with peripheral vascular complications [facility])   [place], Levada Dy M, [place]   11 months ago DM (diabetes mellitus), type 2 with peripheral vascular complications [facility])   [place], [place], MD   11 months ago DM (diabetes mellitus), type 2 with peripheral vascular complications [facility])   [place], [name], MD      Future Appointments            In 1 month Daisy Blossom, Jarome Matin, [place]   In 3 months [place], [place], MD [place] - K in normal range and within 180 days    Potassium  Date Value Ref Range Status  05/12/2022 4.0 3.5 - 5.1 mmol/L Final         Passed - Patient is not pregnant      . FLUoxetine (PROZAC) 20 MG capsule [Pharmacy Med Name: FLUOXETINE HCL 20 MG ORAL CAPSULE] 30 capsule 0    Sig: TAKE 1 CAPSULE (20 MG TOTAL) BY MOUTH AT BEDTIME.     Psychiatry:  Antidepressants - SSRI Failed - 07/07/2022  9:27 AM      Failed - Completed PHQ-2 or PHQ-9 in the last 360 days      Failed - Valid encounter within last 6 months    Recent Outpatient Visits          Today DM (diabetes mellitus), type 2 with peripheral vascular complications [facility])   [place], [place], [place]   7 months ago DM (diabetes mellitus), type 2 with peripheral vascular complications [facility])   [place], Charlane Ferretti, MD   9 months ago DM (diabetes mellitus), type 2 with peripheral vascular complications ([place])  [place], [place], [place]   11 months ago DM (diabetes mellitus), type 2 with peripheral vascular complications [facility])   [place], [place], MD   11 months ago DM (diabetes mellitus), type 2 with peripheral vascular complications [facility])   [place], [name], MD      Future Appointments            In 1 month Daisy Blossom, Jarome Matin, [place]   In 3 months Charlott Rakes, MD [place]           . allopurinol (ZYLOPRIM) 100 MG tablet [Pharmacy Med Name: ALLOPURINOL 100 MG ORAL TABLET] 30 tablet 0    Sig: TAKE 1 TABLET (100 MG TOTAL) BY MOUTH DAILY(AM)     Endocrinology:  Gout Agents - allopurinol Failed - 07/07/2022  9:27 AM      Failed - Uric Acid in normal range and within 360 days    Uric Acid  Date Value Ref Range  Status  08/11/2021 10.3 (H) 3.0 - 7.2 mg/dL Final    Comment:               Therapeutic target for gout patients: <6.0         Failed - Cr in normal range and within 360 days    Creat  Date Value Ref Range Status  01/25/2016 1.09 (H) 0.50 - 1.05 mg/dL Final   Creatinine, Ser  Date Value Ref Range Status  05/12/2022 1.59 (H) 0.44 - 1.00 mg/dL Final   Creatinine, Urine  Date Value Ref Range Status  08/18/2018 72.60 mg/dL Final    Comment:    Performed at [facility], [place] [address], [place],  [address]         Passed - Valid encounter within last 12 months    Recent Outpatient Visits          Today DM (diabetes mellitus), type 2 with peripheral vascular complications [facility])   [place], [place], [place]   7 months ago DM (diabetes mellitus), type 2 with peripheral vascular complications [facility])   [place], [place], MD   9 months ago DM (diabetes mellitus), type 2 with peripheral vascular complications [facility])   [place], Levada Dy M, [place]   11 months ago DM (diabetes mellitus), type 2 with peripheral vascular complications [facility])   [place], [place], MD   11 months ago DM (diabetes mellitus), type 2 with peripheral vascular complications [facility])   [place], [name], MD      Future Appointments            In 1 month Daisy Blossom, Jarome Matin, [place]   In 3 months [place], Roy, MD [place] - CBC within normal limits and completed in the last 12 months    WBC  Date Value Ref Range Status  04/21/2022 8.6 4.0 - 10.5 K/uL Final   RBC  Date Value Ref Range Status  04/21/2022 3.58 (L) 3.87 - 5.11 MIL/uL Final   Hemoglobin  Date Value Ref Range Status  04/21/2022 11.0 (L) 12.0 -  15.0 g/dL Final  05/25/2021 10.5 (L) 11.1 - 15.9 g/dL Final   Total hemoglobin  Date Value Ref Range Status  12/03/2015 9.6 (L) 12.0 - 16.0 g/dL Final   HCT  Date Value Ref Range Status  04/21/2022 33.8 (L) 36.0 - 46.0 % Final   Hematocrit  Date Value Ref Range Status  05/25/2021 35.5 34.0 - 46.6 % Final   MCHC  Date Value Ref Range Status  04/21/2022 32.5 30.0 - 36.0 g/dL Final   [facility]  Date Value Ref Range Status  04/21/2022 30.7 26.0 - 34.0 pg Final   MCV  Date Value Ref Range Status  04/21/2022 94.4 80.0 - 100.0 fL Final  05/25/2021 88 79 - 97 fL Final   No results found for: "PLTCOUNTKUC", "LABPLAT", "POCPLA" RDW  Date Value Ref Range Status  04/21/2022 13.3 11.5 - 15.5 % Final  05/25/2021 14.1 11.7 - 15.4 % Final         . FEROSUL 325 (65 Fe) MG tablet [Pharmacy Med Name: FEROSUL 325 (65 FE) MG ORAL TABLET] 60 tablet 0    Sig: TAKE 1 TABLET (324 MG TOTAL) BY MOUTH 2 (TWO) TIMES DAILY AS NEEDED. (AM+BEDTIME)     Endocrinology:  Minerals - Iron Supplementation Failed - 07/07/2022  9:27 AM      Failed - HGB in normal range and within 360 days    Hemoglobin  Date Value Ref Range Status  04/21/2022 11.0 (L) 12.0 - 15.0 g/dL Final  05/25/2021 10.5 (L) 11.1 - 15.9 g/dL Final   Total hemoglobin  Date Value Ref Range Status  12/03/2015 9.6 (L) 12.0 - 16.0 g/dL Final         Failed - HCT in normal range and within 360 days    HCT  Date Value Ref Range Status  04/21/2022 33.8 (L) 36.0 - 46.0 % Final   Hematocrit  Date Value Ref Range Status  05/25/2021 35.5 34.0 - 46.6 % Final         Failed - RBC in normal range and within 360 days    RBC  Date Value Ref Range Status  04/21/2022 3.58 (L) 3.87 - 5.11 MIL/uL Final         Failed - Fe (serum) in normal range and within 360 days    Iron  Date Value Ref Range Status  11/29/2018 38 28 - 170 ug/dL Final   Saturation Ratios  Date Value Ref Range Status  11/29/2018 9 (L) 10.4 - 31.8 % Final          Failed - Ferritin in normal range and within 360 days    Ferritin  Date Value Ref Range Status  11/29/2018 61 11 - 307 ng/mL Final    Comment:    Performed at [facility], [place] [address], [place], [place] [address]         Passed - Valid encounter within last 12 months    Recent Outpatient Visits          Today DM (diabetes mellitus), type 2 with peripheral vascular complications [facility])   [place], [place], [place]   7 months ago DM (diabetes mellitus), type 2 with peripheral vascular complications [facility])   [place], Charlane Ferretti, MD   9 months ago DM (diabetes mellitus), type 2 with peripheral vascular complications [facility])   [place], Levada Dy M, [place]   11 months ago DM (diabetes mellitus), type 2 with  peripheral vascular complications [facility])   [place], [place], MD   11 months ago DM (diabetes mellitus), type 2 with peripheral vascular complications [facility])   [place], [name], MD      Future Appointments            In 1 month Daisy Blossom, Jarome Matin, [place]   In 3 months Charlott Rakes, MD [place]           . pantoprazole (PROTONIX) 40 MG tablet [Pharmacy Med Name: PANTOPRAZOLE SODIUM 40 MG ORAL TABLET DELAYED RELEASE] 30 tablet 0    Sig: TAKE 1 TABLET (40 MG TOTAL) BY MOUTH DAILY(AM)     Gastroenterology: Proton Pump Inhibitors Passed - 07/07/2022  9:27 AM      Passed - Valid encounter within last 12 months    Recent Outpatient Visits          Today DM (diabetes mellitus), type 2 with peripheral vascular complications [facility])   [place], [place], [place]   7 months ago DM (diabetes mellitus), type 2 with peripheral vascular complications ([place])   [place], [place], MD   9 months ago DM (diabetes mellitus), type 2 with peripheral vascular complications [facility])   [place], [place], [place]   11 months ago DM (diabetes mellitus), type 2 with peripheral vascular complications [facility])   [place], [place], MD   11 months ago DM (diabetes mellitus), type 2 with peripheral vascular complications [facility])   [place], [name], MD      Future Appointments            In 1 month Daisy Blossom, Jarome Matin, [place]   In 3 months Charlott Rakes, MD [place]

## 2022-07-08 ENCOUNTER — Encounter (HOSPITAL_COMMUNITY): Payer: Self-pay

## 2022-07-08 ENCOUNTER — Ambulatory Visit (HOSPITAL_COMMUNITY)
Admission: RE | Admit: 2022-07-08 | Discharge: 2022-07-08 | Disposition: A | Discharge status: Home / Self Care | Payer: Medicare Other | Source: Ambulatory Visit | Attending: Family Medicine | Admitting: Family Medicine

## 2022-07-08 ENCOUNTER — Telehealth (HOSPITAL_COMMUNITY): Payer: Self-pay | Admitting: Surgery

## 2022-07-08 ENCOUNTER — Other Ambulatory Visit: Payer: Self-pay | Admitting: Physician Assistant

## 2022-07-08 VITALS — BP 128/60 | HR 69 | Wt 228.0 lb

## 2022-07-08 DIAGNOSIS — N1831 Chronic kidney disease, stage 3a: Secondary | ICD-10-CM | POA: Diagnosis not present

## 2022-07-08 DIAGNOSIS — I6521 Occlusion and stenosis of right carotid artery: Secondary | ICD-10-CM

## 2022-07-08 DIAGNOSIS — I739 Peripheral vascular disease, unspecified: Secondary | ICD-10-CM | POA: Diagnosis not present

## 2022-07-08 DIAGNOSIS — N189 Chronic kidney disease, unspecified: Secondary | ICD-10-CM

## 2022-07-08 DIAGNOSIS — N183 Chronic kidney disease, stage 3 unspecified: Secondary | ICD-10-CM | POA: Insufficient documentation

## 2022-07-08 DIAGNOSIS — E1151 Type 2 diabetes mellitus with diabetic peripheral angiopathy without gangrene: Secondary | ICD-10-CM | POA: Diagnosis not present

## 2022-07-08 DIAGNOSIS — E11621 Type 2 diabetes mellitus with foot ulcer: Secondary | ICD-10-CM | POA: Insufficient documentation

## 2022-07-08 DIAGNOSIS — L97519 Non-pressure chronic ulcer of other part of right foot with unspecified severity: Secondary | ICD-10-CM | POA: Insufficient documentation

## 2022-07-08 DIAGNOSIS — E1169 Type 2 diabetes mellitus with other specified complication: Secondary | ICD-10-CM | POA: Diagnosis not present

## 2022-07-08 DIAGNOSIS — I5042 Chronic combined systolic (congestive) and diastolic (congestive) heart failure: Secondary | ICD-10-CM

## 2022-07-08 DIAGNOSIS — I480 Paroxysmal atrial fibrillation: Secondary | ICD-10-CM | POA: Diagnosis not present

## 2022-07-08 DIAGNOSIS — E785 Hyperlipidemia, unspecified: Secondary | ICD-10-CM | POA: Insufficient documentation

## 2022-07-08 DIAGNOSIS — I130 Hypertensive heart and chronic kidney disease with heart failure and stage 1 through stage 4 chronic kidney disease, or unspecified chronic kidney disease: Secondary | ICD-10-CM | POA: Insufficient documentation

## 2022-07-08 DIAGNOSIS — K7460 Unspecified cirrhosis of liver: Secondary | ICD-10-CM | POA: Insufficient documentation

## 2022-07-08 DIAGNOSIS — I2510 Atherosclerotic heart disease of native coronary artery without angina pectoris: Secondary | ICD-10-CM | POA: Diagnosis not present

## 2022-07-08 DIAGNOSIS — Z79890 Hormone replacement therapy (postmenopausal): Secondary | ICD-10-CM | POA: Insufficient documentation

## 2022-07-08 DIAGNOSIS — G4733 Obstructive sleep apnea (adult) (pediatric): Secondary | ICD-10-CM

## 2022-07-08 DIAGNOSIS — Z8719 Personal history of other diseases of the digestive system: Secondary | ICD-10-CM

## 2022-07-08 DIAGNOSIS — Z7984 Long term (current) use of oral hypoglycemic drugs: Secondary | ICD-10-CM | POA: Insufficient documentation

## 2022-07-08 DIAGNOSIS — Z79899 Other long term (current) drug therapy: Secondary | ICD-10-CM | POA: Insufficient documentation

## 2022-07-08 DIAGNOSIS — E1122 Type 2 diabetes mellitus with diabetic chronic kidney disease: Secondary | ICD-10-CM | POA: Diagnosis not present

## 2022-07-08 DIAGNOSIS — Z7901 Long term (current) use of anticoagulants: Secondary | ICD-10-CM | POA: Insufficient documentation

## 2022-07-08 DIAGNOSIS — J449 Chronic obstructive pulmonary disease, unspecified: Secondary | ICD-10-CM | POA: Diagnosis not present

## 2022-07-08 DIAGNOSIS — I10 Essential (primary) hypertension: Secondary | ICD-10-CM

## 2022-07-08 LAB — BRAIN NATRIURETIC PEPTIDE: B Natriuretic Peptide: 225.5 pg/mL — ABNORMAL HIGH (ref 0.0–100.0)

## 2022-07-08 LAB — LIPID PANEL
Cholesterol: 203 mg/dL — ABNORMAL HIGH (ref 0–200)
HDL: 47 mg/dL (ref >40)
LDL Cholesterol: 93 mg/dL (ref 0–99)
Total CHOL/HDL Ratio: 4.3 RATIO
Triglycerides: 315 mg/dL — ABNORMAL HIGH (ref <150)
VLDL: 63 mg/dL — ABNORMAL HIGH (ref 0–40)

## 2022-07-08 LAB — COMPREHENSIVE METABOLIC PANEL
ALT: 14 IU/L (ref 0–32)
AST: 16 IU/L (ref 0–40)
Albumin/Globulin Ratio: 1.5 (ref 1.2–2.2)
Albumin: 4 g/dL (ref 3.8–4.9)
Alkaline Phosphatase: 138 IU/L — ABNORMAL HIGH (ref 44–121)
BUN/Creatinine Ratio: 24 — ABNORMAL HIGH (ref 9–23)
BUN: 51 mg/dL — ABNORMAL HIGH (ref 6–24)
Bilirubin Total: <0.2 mg/dL (ref 0.0–1.2)
CO2: 28 mmol/L (ref 20–29)
Calcium: 9.3 mg/dL (ref 8.7–10.2)
Chloride: 91 mmol/L — ABNORMAL LOW (ref 96–106)
Creatinine, Ser: 2.11 mg/dL — ABNORMAL HIGH (ref 0.57–1.00)
Globulin, Total: 2.7 g/dL (ref 1.5–4.5)
Glucose: 225 mg/dL — ABNORMAL HIGH (ref 70–99)
Potassium: 4.7 mmol/L (ref 3.5–5.2)
Sodium: 136 mmol/L (ref 134–144)
Total Protein: 6.7 g/dL (ref 6.0–8.5)
eGFR: 26 mL/min/{1.73_m2} — ABNORMAL LOW (ref >59)

## 2022-07-08 MED ORDER — TORSEMIDE 20 MG PO TABS: 20.0000 mg | ORAL_TABLET | ORAL | 0 refills | Status: DC | PRN

## 2022-07-08 NOTE — Progress Notes (Addendum)
Patient ID: [name], female   DOB: [birth year], 61 y.o.   MRN: [id number]    Advanced Heart Failure Clinic Note   PCP: [facility] PV: Dr. Gwenlyn Found HF Cardiology: Dr. Aundra Dubin  [name] is a 61 y.o. with history of PAD, carotid stenosis s/p left CEA, relatively mild CAD, chronic systolic CHF, paroxysmal atrial fibrillation and prior substance abuse presents for CHF clinic followup.  She was admitted in 1/17 with acute hypoxemic respiratory failure in the setting of atrial fibrillation/RVR and volume overload.  She was initially intubated.  She converted back to NSR with amiodarone gtt.  She was treated with IV Lasix, steroids, bronchodilators.  She developed AKI and losartan was stopped.    She was admitted in 3/17 with symptomatic bradycardia.  She was supposed to stop diltiazem after this admission but continued to take it.  She presented later in 3/17, again with symptomatic bradycardia (junctional bradycardia), hypotension, and AKI.  Diltiazem and bisoprolol were stopped.  HR recovered and she has had no problems since.  ACEI was stopped with AKI.   In 5/18, she had peripheral angiography with atherectomy of right SFA.  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 7/18 ABIs showed 0.65 on right, 0.31 on left.  In 8/20, she had peripheral angiography showing totally occluded left SFA.  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 Donnetta Hutching.  ABIs in 2/21 were 0.44 on right, 0.99 on left.   Echo in 1/21 showed EF 50%, mild LVH, normal RV, PASP 60 mmHg.   Echo 03/08/21 EF 55-60% with basal to mid inferolateral hypokinesis.   Admitted 6/22 for mechanical fall, found to have AKI on CKD. She was given IV lasix and her losartan was held.  Underwent PV angiogram with Dr. Gwenlyn Found 7/22. She had drug-coated balloon angioplasty of the right SFA after being admitted for critical ischemia of the right foot.  Repeat peripheral  arterial dopplers in 8/22 still showed severe right SFA disease.   Admitted 8/22 for bradycardia and AKI, bisoprolol subsequently stopped; amiodarone was continued.  Seen in ED 9/22 for N/V w/ black tarry stools after taking Pepto-Bismal, losartan stopped for unclear reasons  Admitted 6/23 for viral gastroenteritis/ sepsis. Per D/c summary, she remained stable from HF standpoint. Her meds were temporarily held but ultimately restarted prior to d/c, w/o change to prior regimen.   Today she returns for HF follow up. Overall feeling fine. Main complaint is LE neuropathy. She has LE swelling. She uses a walker and cane when getting around the house but no significant dyspnea with ADLs. Denies palpitations, CP, dizziness, or PND/Orthopnea. Appetite ok. No fever or chills. Weight at home 225 pounds. Taking all medications. Using Cardiomems regularly. Followed by paramedicine.  Cardiomems: PAd 20 today, goal 20  ECG (personally reviewed): NSR w/ non-specific TWI  Labs (1/17): K 5.2, creatinine 1.4, AST 43, ALT normal Labs (2/17): K 4.2, creatinine 1.3, BNP 607, TSH normal Labs (3/17): K 4.2, creatinine 1.45, AST/ALT normal, HCT 28.7 Labs (4/17): K 4.4, creatinine 1.61 Labs (08/31/2016): K 4.5 Creatinine 1.43  Labs (11/17): K 4.2, creatinine 1.4 Labs (9/18): K 4.1, creatinine 1.1, TSH normal Labs 04/13/2018: K 5 Creatinine 1.75 Labs (2/21): K 4.1, creatinine 1.58 Labs (4/22): K 3.7, creatinine 1.55 Labs (5/22): K 3.8, creatinine 1.54 Labs (8/22): K 4.0, creatinine 2.07 Labs (11/22): TSH normal, LDL 59 Labs (12/22): K 3.6, creatinine 1.28, BNP 256, AST/ALT  normal Labs (3/23): K 4.3, creatinine 1.82 => 1.7, hgb 11 Labs (5/22) K 3.6, creatinine 2.2, TG 405  Labs (6/22) K 3.6, creatinine 1.34  Labs (8/23): K 4.7, creatinine 2.11  PMH: 1. Carotid stenosis: Known occluded right carotid.  Left CEA in 4/16.  - Carotid dopplers (7/21): CTO RICA, mild disease LICA.  - Carotid dopplers (7/22): CTO  RICA, 8-11% LICA.  2. CAD: LHC in 12/15 with 80% stenosis in small OM1, nonobstructive disease in other territories.  3. Chronic systolic CHF: Nonischemic cardiomyopathy (?due to ETOH or prior drug abuse).  She has a Cardiomems.  - Echo (1/17) with EF 45%, mild LV hypertrophy, moderate diastolic dysfunction, inferolateral severe hypokinesis, mildly decreased RV systolic function.  - Echo (1/18): EF 40-45%, mild LVH, normal RV size and systolic function.  - Echo (1/21): EF 50%, mild LVH, normal RV, PASP 60 mmHg.  - Echo (4/22): EF 55-60%, basal-mid inferolateral hypokinesis with grade II diastolic dysfunction, RV normal, PASP 42 4. Atrial fibrillation: Paroxysmal.   5. Type II diabetes 6. CKD: Stage III. 7. COPD: Smokes 1 ppd.  8. 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  No longer drinks.  9. Hypothyroidism 10. PAD: Atherectomy SFA in 2014 (Dr Gwenlyn Found).  Peripheral arterial dopplers (2/17) with focal 75-99% proximal right SFA stenosis, occluded mid-distal right SFA, chronic occlusion of all runoff arteries on right. - In 5/18, she had peripheral angiography with atherectomy of right SFA.   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  - 7/18 ABIs showed 0.65 on right, 0.31 on left.   - Peripheral angiography 8/20: Totally occluded left SFA.  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 Donnetta Hutching.   - ABIs (2/21): 0.44 R, 0.99 L.  - Balloon angioplasty right SFA 7/22.  - ABIs (8/22): severe right SFA disease.  11. Anemia 12. Prior cocaine abuse.  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  14. OSA: Has not been able to tolerate CPAP.  15. Diabetic neuropathy.   SH: Lives with sister.  Prior cocaine abuse.  Prior ETOH abuse.  Quit smoking in 1/17, restarted, and quit again in 4/19, restarted again.  FH: CAD  Review of systems complete and found to be negative unless listed in HPI.    Current  Outpatient Medications  Medication Sig Dispense Refill   ACCU-CHEK GUIDE test strip USE TO CHECK BLOOD SUGAR FOUR TIMES DAILY     acetaminophen (TYLENOL) 500 MG tablet Take 1,000 mg by mouth every 6 (six) hours as needed for headache (pain).     albuterol (VENTOLIN HFA) 108 (90 Base) MCG/ACT inhaler Inhale 2 puffs into the lungs every 6 (six) hours as needed for wheezing or shortness of breath. 18 g 3   allopurinol (ZYLOPRIM) 100 MG tablet TAKE 1 TABLET (100 MG TOTAL) BY MOUTH DAILY(AM) 30 tablet 3   amiodarone (PACERONE) 200 MG tablet TAKE 1/2 TABLET (100 MG TOTAL) BY MOUTH DAILY (AM) (Patient taking differently: Take 100 mg by mouth every morning.) 15 tablet 3   amLODipine (NORVASC) 10 MG tablet Take 1 tablet (10 mg total) by mouth every morning. 90 tablet 1   apixaban (ELIQUIS) 5 MG TABS tablet Take 1 tablet (5 mg total) by mouth 2 (two) times daily. 60 tablet 8   Ascorbic Acid (VITAMIN C PO) Take 1 tablet by mouth every morning.     Aspirin-Salicylamide-Caffeine (BC HEADACHE POWDER PO) Take 1 packet  by mouth 4 (four) times daily as needed (pain/headache).     atorvastatin (LIPITOR) 40 MG tablet TAKE 1 TABLET (40 MG TOTAL) BY MOUTH DAILY(BEDTIME) 30 tablet 3   budesonide-formoterol (SYMBICORT) 160-4.5 MCG/ACT inhaler Inhale 2 puffs into the lungs 2 (two) times daily. 1 each 2   buPROPion (WELLBUTRIN SR) 150 MG 12 hr tablet TAKE 1 TABLET BY MOUTH DAILY FOR 3 DAYS THEN INCREASE TO 1 TABLET BY MOUTH 2 TIMES DAILY(AM+BEDTIME) 60 tablet 3   Cholecalciferol (VITAMIN D3 PO) Take 1 capsule by mouth every morning.     Continuous Blood Gluc Sensor (FREESTYLE LIBRE 2 SENSOR) MISC USE 1 (ONE) EACH EVERY 2 WEEKS 2 each 11   Dulaglutide (TRULICITY) 4.33 IR/5.1OA SOPN Inject 0.75 mg into the skin every Friday. 2 mL 3   empagliflozin (JARDIANCE) 10 MG TABS tablet Take 1 tablet (10 mg total) by mouth daily. TAKE 1 TABLET before breakfast daily 90 tablet 3   FEROSUL 325 (65 Fe) MG tablet TAKE 1 TABLET (324 MG  TOTAL) BY MOUTH 2 (TWO) TIMES DAILY AS NEEDED. (AM+BEDTIME) 60 tablet 3   FLUoxetine (PROZAC) 20 MG capsule TAKE 1 CAPSULE (20 MG TOTAL) BY MOUTH AT BEDTIME. 30 capsule 3   folic acid (FOLVITE) 1 MG tablet Take 1 tablet (1 mg total) by mouth daily. 90 tablet 1   hydrALAZINE (APRESOLINE) 50 MG tablet TAKE 1 & 1/2 TABLETS (75 MG TOTAL) BY MOUTH 3 (THREE) TIMES DAILY (AM+NOON+BEDTIME) 140 tablet 4   icosapent Ethyl (VASCEPA) 1 g capsule Take 2 capsules (2 g total) by mouth 2 (two) times daily. 120 capsule 6   insulin lispro (HUMALOG) 100 UNIT/ML KwikPen Inject 8 Units into the skin 3 (three) times daily with meals. 15 mL 3   Insulin Pen Needle (EASY COMFORT PEN NEEDLES) 31G X 5 MM MISC USE FOUR TIMES A DAY FOR INSULIN ADMINISTRATION 100 each 1   LANTUS SOLOSTAR 100 UNIT/ML Solostar Pen Inject 30 Units into the skin daily. 15 mL 3   levothyroxine (SYNTHROID) 50 MCG tablet TAKE 1 TABLET (50 MCG TOTAL) BY MOUTH DAILY BEFORE BREAKFAST (AM) 30 tablet 3   losartan (COZAAR) 25 MG tablet TAKE 2 TABLETS (50 MG TOTAL) BY MOUTH DAILY (AM) 60 tablet 3   Omega-3 Fatty Acids (FISH OIL PO) Take 1 capsule by mouth every morning.     ondansetron (ZOFRAN) 4 MG tablet Take 1 tablet (4 mg total) by mouth every 8 (eight) hours as needed for nausea or vomiting. 20 tablet 0   pantoprazole (PROTONIX) 40 MG tablet TAKE 1 TABLET (40 MG TOTAL) BY MOUTH DAILY(AM) 30 tablet 3   spironolactone (ALDACTONE) 25 MG tablet Take 0.5 tablets (12.5 mg total) by mouth daily. 45 tablet 1   torsemide (DEMADEX) 20 MG tablet Take 5 tablets (100 mg total) by mouth in the morning AND 4 tablets (80 mg total) every evening. Take 80 mg in am and 60 mg in pm. 280 tablet 6   triamcinolone 0.1% oint-Cerave equivalent lotion 1:1 mixture Apply topically 2 (two) times daily as needed. 454 g 1   No current facility-administered medications for this visit.   LMP  (LMP Unknown)   Wt Readings from Last 3 Encounters:  07/07/22 102.7 kg (226 lb 6.4 oz)   06/30/22 102.1 kg (225 lb)  05/05/22 103.9 kg (229 lb)    Physical Exam: General:  NAD. No resp difficulty, arrived in [facility], chronically-ill appearing. HEENT: Normal Neck: Supple. No JVD. Carotids 2+ bilat; no bruits. No lymphadenopathy  or thryomegaly appreciated. Cor: PMI nondisplaced. Regular rate & rhythm. No rubs, gallops or murmurs. Lungs: Diminished in bases. Abdomen: Soft, nontender, nondistended. No hepatosplenomegaly. No bruits or masses. Good bowel sounds. Extremities: No cyanosis, clubbing, rash, 1+ BLE pre-tibial edema w/ erythema to LLE Neuro: Alert & oriented x 3, cranial nerves grossly intact. Moves all 4 extremities w/o difficulty. Affect pleasant.  Assessment/Plan: 1. Chronic diastolic CHF: LHC in 18/29 showing only 80% stenosis in small OM1. EF as low as 40-45% in the past. Echo 4/22 showed EF 55-60% with grade 2 diastolic dysfunction.   Not very active, NYHA class III symptoms. Volume stable by Cardiomems, does have LE edema today. - Increase torsemide to 100 mg q am/80 mg q pm x 3 days, then back to 80 mg bid. BMET and BNP today. - Continue spiro 12.5 mg daily  - Continue losartan 50 mg daily.  - Continue Jardiance 10 mg daily. No GU symptoms. - Off bisoprolol due to bradycardia.   - Continue Cardiomems  - Elevate legs. 2. Atrial fibrillation: Paroxysmal. RRR on exam today.  - Continue amiodarone 100 mg daily.  LFTs/ TFTs checked last visit and normal.  She is on levothyroxine, followed by PCP.  Needs regular eye exam.  - Continue Eliquis 5 mg bid.   3. PAD: Claudication bilaterally.  Not candidate for cilostazol with CHF.  She has had a left fem-pop bypass in 8/20.  ABIs in 2/21 were very abnormal on right. She underwent PV angiogram with balloon angioplasty of the right SFA 7/22.  8/22 dopplers still showed severe right SFA disease and she had a right foot ulceration and was being seen in [facility]. Dopplers 2/23 showed patent right SFA.  She has been having  increased leg pain recently, also with ulcer on right foot. Leg pain probably not all claudication, also has diabetic neuropathy.  - She is followed by Dr. Gwenlyn Found, think she needs a followup with him. Advised to arrange f/u.  - Continue atorvastatin.  - She is now off Plavix. 4. COPD:  Congratulated on quitting smoking.  5. H/o cirrhosis: Likely from ETOH, she has quit.  6. OSA: Not able to tolerate CPAP.   7. CKD stage III:  Baseline SCr ~1.5 8. HTN: Controlled.   9.Type 2 diabetes: Per PCP.   10. CAD: LHC in 12/15 with 80% stenosis in small OM1, nonobstructive disease in other territories. No chest pain. - Continue statin.  - Continue Eliquis. 11. Carotid stenosis: Due for carotid dopplers in 7/23.  12. HLD: Continue atorvastatin. Recent LP 4/23, unable to calculate LDL given elevated TG, 405 - Continue Vascepa. - Check lipids today.  Followup 3 months with Dr. Aundra Dubin.  [place], [place] 07/08/2022

## 2022-07-08 NOTE — Addendum Note (Signed)
Encounter addended by: Rafael Bihari, FNP on: 07/08/2022 4:24 PM  Actions taken: Clinical Note Signed

## 2022-07-08 NOTE — Telephone Encounter (Signed)
I attempted to reach patient.  I left a message for a return call.

## 2022-07-08 NOTE — Telephone Encounter (Signed)
-----   Message from Rafael Bihari, [place] sent at 07/08/2022  4:28 PM EDT ----- TGs trending down but remain elevated. Please make sure she has been taking her Lipitor regularly.

## 2022-07-08 NOTE — Patient Instructions (Addendum)
Thank you for coming in today  Labs were done today, if any labs are abnormal the clinic will call you No news is good news  EKG done today  Your physician recommends that you schedule a follow-up appointment in:  3 months with Dr. Aundra Dubin  TAKE EXTRA DOSE OF TORSEMIDE 20 MG 1 TABLET FOR 3 DAYS ONLY  PLEASE ELEVATE YOUR  LEGS    Do the following things EVERYDAY: Weigh yourself in the morning before breakfast. Write it down and keep it in a log. Take your medicines as prescribed Eat low salt foods--Limit salt (sodium) to 2000 mg per day.  Stay as active as you can everyday Limit all fluids for the day to less than 2 liters  At the [facility], you and your health needs are our priority. As part of our continuing mission to provide you with exceptional heart care, we have created designated Provider Care Teams. These Care Teams include your primary Cardiologist (physician) and Advanced Practice Providers (APPs- Physician Assistants and Nurse Practitioners) who all work together to provide you with the care you need, when you need it.   You may see any of the following providers on your designated Care Team at your next follow up: Dr Glori Bickers Dr Loralie Champagne Dr. Roxana Hires, NP Lyda Jester, [place] [facility] [place], [place] Forestine Na, NP Audry Riles, PharmD   Please be sure to bring in all your medications bottles to every appointment.   If you have any questions or concerns before your next appointment please send [place] a message through [employer] or call our office at [phone].    TO LEAVE A MESSAGE FOR THE NURSE SELECT OPTION 2, PLEASE LEAVE A MESSAGE INCLUDING: YOUR NAME DATE OF BIRTH CALL BACK NUMBER REASON FOR CALL**this is important as we prioritize the call backs  YOU WILL RECEIVE A CALL BACK THE SAME DAY AS LONG AS YOU CALL BEFORE 4:00 PM

## 2022-07-11 ENCOUNTER — Telehealth (HOSPITAL_COMMUNITY): Payer: Self-pay

## 2022-07-11 ENCOUNTER — Other Ambulatory Visit: Payer: Self-pay | Admitting: Physician Assistant

## 2022-07-11 ENCOUNTER — Other Ambulatory Visit (HOSPITAL_COMMUNITY): Payer: Self-pay

## 2022-07-11 DIAGNOSIS — J449 Chronic obstructive pulmonary disease, unspecified: Secondary | ICD-10-CM

## 2022-07-11 DIAGNOSIS — H34832 Tributary (branch) retinal vein occlusion, left eye, with macular edema: Secondary | ICD-10-CM | POA: Diagnosis not present

## 2022-07-11 NOTE — Telephone Encounter (Signed)
Contacted pt to f/u on extra torsemide needed from her last clinic appoint last week.  She reports the pharmacy did not bring it out and she also needs her ventolin and symbicort.    Ventolin-needs new rx  Symbicort-p/u on 8/24  I contacted pharmacy to see if they got the rx for extra torsemide and also to refill those inhalers.    Marylouise Stacks, [place] 07/11/2022

## 2022-07-11 NOTE — Progress Notes (Signed)
I noted torsemide change from her visit last week-I went to pharmacy to get the extra torsemide and her inhaler along with some cream she ordered.  Upon checking her pill box has 100/80 mg of torsemide in there now. So I had to send message to triage staff to clarify-I told pt to not take the extra torsemide yet until I hear back. There was another dosage of 80/60 listed along with that one but I couldn't find any notes to support that change from the 100/80 dosing.  Will f/u once I hear back.   Marylouise Stacks, [place] 07/11/2022

## 2022-07-12 ENCOUNTER — Ambulatory Visit: Payer: Medicare Other | Attending: Family Medicine

## 2022-07-12 NOTE — Telephone Encounter (Signed)
Requested medications are due for refill today.  yes  Requested medications are on the active medications list.  yes  Last refill. 07/30/2021 18 3 refills  Future visit scheduled.   yes  Notes to clinic.  Rx signed by Myrene Buddy.    Requested Prescriptions  Pending Prescriptions Disp Refills   albuterol (VENTOLIN HFA) 108 (90 Base) MCG/ACT inhaler [Pharmacy Med Name: ALBUTEROL SULFATE HFA 108 (90 BASE) MCG/ACT INHALATION AEROSOL SOLUTION] 8.5 g     Sig: USE 2 PUFFS BY MOUTH EVERY FOUR HOURS, AS NEEDED, FOR COUGHING/WHEEZING     Pulmonology:  Beta Agonists 2 Passed - 07/11/2022 12:05 PM      Passed - Last BP in normal range    BP Readings from Last 1 Encounters:  07/08/22 128/60         Passed - Last Heart Rate in normal range    Pulse Readings from Last 1 Encounters:  07/08/22 69         Passed - Valid encounter within last 12 months    Recent Outpatient Visits           5 days ago DM (diabetes mellitus), type 2 with peripheral vascular complications [facility])   [place], [place], [place]   7 months ago DM (diabetes mellitus), type 2 with peripheral vascular complications ([place])   [place], [place], [place]   9 months ago DM (diabetes mellitus), type 2 with peripheral vascular complications [facility])   [place], [place], [place]   11 months ago DM (diabetes mellitus), type 2 with peripheral vascular complications [facility])   [place], [place], [place]   12 months ago DM (diabetes mellitus), type 2 with peripheral vascular complications [facility])   [place], [name], MD       Future Appointments             In 1 month Daisy Blossom, [place], [place]   In 3 months Charlott Rakes, MD [place]

## 2022-07-13 ENCOUNTER — Telehealth (HOSPITAL_COMMUNITY): Payer: Self-pay

## 2022-07-14 NOTE — Telephone Encounter (Signed)
I was able to reach pt today regarding the extra torsemide that provider instructed her to do since pt reported taking different dosing--  Provider still wants pt to take the 20mg  extra for 3 days-I called pt to advise her of that. Pt aware and understood.    [name], [place] 07/14/2022

## 2022-07-27 ENCOUNTER — Ambulatory Visit: Payer: Self-pay | Admitting: Licensed Clinical Social Worker

## 2022-07-27 NOTE — Patient Outreach (Signed)
  Care Coordination   07/27/2022 Name: [name] MRN: [id number] DOB: 06/06/1961   Care Coordination Outreach Attempts:  An unsuccessful telephone outreach was attempted today to offer the patient information about available care coordination services as a benefit of their health plan.   Follow Up Plan:  Additional outreach attempts will be made to offer the patient care coordination information and services.   Encounter Outcome:  No Answer  Care Coordination Interventions Activated:  No   Care Coordination Interventions:  No, not indicated    Christa See, MSW, [email or web address] Phone [phone] 1:29 PM

## 2022-07-29 ENCOUNTER — Ambulatory Visit (INDEPENDENT_AMBULATORY_CARE_PROVIDER_SITE_OTHER): Payer: Medicare Other | Admitting: Podiatry

## 2022-07-29 ENCOUNTER — Encounter: Payer: Self-pay | Admitting: Podiatry

## 2022-07-29 DIAGNOSIS — L84 Corns and callosities: Secondary | ICD-10-CM

## 2022-07-29 DIAGNOSIS — M79676 Pain in unspecified toe(s): Secondary | ICD-10-CM | POA: Diagnosis not present

## 2022-07-29 DIAGNOSIS — E1151 Type 2 diabetes mellitus with diabetic peripheral angiopathy without gangrene: Secondary | ICD-10-CM | POA: Diagnosis not present

## 2022-07-29 DIAGNOSIS — B351 Tinea unguium: Secondary | ICD-10-CM | POA: Diagnosis not present

## 2022-07-29 DIAGNOSIS — I739 Peripheral vascular disease, unspecified: Secondary | ICD-10-CM | POA: Diagnosis not present

## 2022-07-29 DIAGNOSIS — Z89431 Acquired absence of right foot: Secondary | ICD-10-CM

## 2022-07-29 NOTE — Progress Notes (Signed)
This patient returns to my office for at risk foot care.  This patient requires this care by a professional since this patient will be at risk due to having  PAD, coagulation defect, and chronic kidney disease.  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  Patient is taking plavix.  This patient is unable to cut nails herself since the patient cannot reach her nails.These nails on her left foot  are painful walking and wearing shoes.   She also has a bloody callus  noted along  the amputation site  right foot. This patient presents for at risk foot care today.  General Appearance  Alert, conversant and in no acute stress.  Vascular  Dorsalis pedis and posterior tibial  pulses are not  palpable  bilaterally.  Capillary return is diminished   bilaterally. Cold feet  bilaterally.  Neurologic  Senn-Weinstein monofilament wire test diminished.  bilaterally. Muscle power within normal limits bilaterally.  Nails Thick disfigured discolored nails with subungual debris  1-4 left foot.  . No evidence of bacterial infection or drainage bilaterally.  Orthopedic  No limitations of motion  feet .  No crepitus or effusions noted.  No bony pathology or digital deformities noted. TMA right foot.  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  No signs of infections or ulcers noted.    Ulcer/ hemorrhagic callus  noted distal first metatarsal head right foot.  No evidence of redness or infection noted.   Onychomycosis    Pain in left toes  Callus/hemorrhagic callus right foot.  Consent was obtained for treatment procedures.   Mechanical debridement of nails 1-4  Left foot. performed with a nail nipper.  Filed with dremel without incident. Debridement of callus distal 1st metatarsal right foot.  After debridement with # 15 blade the site was smoothed with dremel tool.  Healing of transverse ulcer was noted in the center of callus right  foot.   Return office visit   10 weeks                     Told patient to return for periodic foot care and evaluation due to potential at risk complications.   Gardiner Barefoot DPM

## 2022-08-01 DIAGNOSIS — H34832 Tributary (branch) retinal vein occlusion, left eye, with macular edema: Secondary | ICD-10-CM | POA: Diagnosis not present

## 2022-08-03 ENCOUNTER — Other Ambulatory Visit: Payer: Medicare Other

## 2022-08-05 ENCOUNTER — Other Ambulatory Visit (HOSPITAL_COMMUNITY): Payer: Self-pay | Admitting: Cardiology

## 2022-08-08 ENCOUNTER — Other Ambulatory Visit: Payer: Medicare Other

## 2022-08-10 ENCOUNTER — Other Ambulatory Visit: Payer: Medicare Other

## 2022-08-10 NOTE — Progress Notes (Deleted)
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 female who presents for diabetes evaluation, education, and management.  PMH is significant for CKD 3b, multisubstance use, obesity, T2DM, hypothyroidism, COPD, OSA, HTN, CAD, CHF, PAF.   Patient was referred and last seen by Primary Care Provider, NP Freeman Caldron, on 8/24. At last visit, she reported not taking her diabetes medications regularly.   Today, patient arrives in *** good spirits and presents without *** any assistance. ***  Patient reports Diabetes was diagnosed in ***.   Family/Social History: ***  Current diabetes medications include: Trulicity 0.75mg  once weekly, Jardiance 10mg  once daily, Humalog 8 units TID, Lantus 30 units once daily Current hypertension medications include: amlodipine 10mg  once daily, hydralazine 75mg  TID, losartan 25mg  2 tablets once daily, spironlactone 25mg  1/2 tablet once daily Current hyperlipidemia medications include: atorvastatin 40mg  once daily  Patient reports adherence to taking all medications as prescribed.  *** Patient denies adherence with medications, reports missing *** medications *** times per week, on average.  Do you feel that your medications are working for you? {YES NO:22349} Have you been experiencing any side effects to the medications prescribed? {YES NO:22349} Do you have any problems obtaining medications due to transportation or finances? {YES [id number] Insurance coverage: ***  Patient {Actions; denies-reports:120008} hypoglycemic events.  Reported home fasting blood sugars: ***  Reported 2 hour post-meal/random blood sugars: ***.  Patient {Actions; denies-reports:120008} nocturia (nighttime urination).  Patient {Actions; denies-reports:120008} neuropathy (nerve pain). Patient {Actions; denies-reports:120008} visual changes. Patient {Actions; denies-reports:120008} self foot exams.   Patient reported dietary habits: Eats *** meals/day Breakfast:  *** Lunch: *** Dinner: *** Snacks: *** Drinks: ***  Within the past 12 months, did you worry whether your food would run out before you got money to buy more? {YES NO:22349} Within the past 12 months, did the food you bought run out, and you didn't have money to get more? {YES NO:22349} PHQ-9 Score: ***  Patient-reported exercise habits: ***   O:   ROS  Physical Exam  7 day average blood glucose: ***  *** CGM Download:  % Time CGM is active: ***% Average Glucose: *** mg/dL Glucose Management Indicator: ***  Glucose Variability: *** (goal <36%) Time in Goal:  - Time in range 70-180: ***% - Time above range: ***% - Time below range: ***% Observed patterns:   Lab Results  Component Value Date   HGBA1C 10.3 (H) 04/18/2022   There were no vitals filed for this visit.  Lipid Panel     Component Value Date/Time   CHOL 203 (H) 07/08/2022 1454   TRIG 315 (H) 07/08/2022 1454   HDL 47 07/08/2022 1454   CHOLHDL 4.3 07/08/2022 1454   VLDL 63 (H) 07/08/2022 1454   LDLCALC 93 07/08/2022 1454   LDLDIRECT 90.8 04/04/2022 1507    Clinical Atherosclerotic Cardiovascular Disease (ASCVD): No  The 10-year ASCVD risk score (Arnett DK, et al., 2019) is: 17.9%   Values used to calculate the score:     Age: 36 years     Sex: Female     Is Non-Hispanic African American: No     Diabetic: Yes     Tobacco smoker: Yes     Systolic Blood Pressure: 629 mmHg     Is BP treated: Yes     HDL Cholesterol: 47 mg/dL     Total Cholesterol: 203 mg/dL    A/P: Diabetes longstanding *** currently ***. Patient is *** able to verbalize  appropriate hypoglycemia management plan. Medication adherence appears ***. Control is suboptimal due to ***. -{Meds adjust:18428} basal insulin *** (insulin ***). Patient will continue to titrate 1 unit every *** days if fasting blood sugar > 100mg /dl until fasting blood sugars reach goal or next visit.  -{Meds adjust:18428} rapid insulin *** (insulin ***) to  ***.  -{Meds adjust:18428} GLP-1 *** (generic ***) to ***.  -{Meds adjust:18428} SGLT2-I *** (generic ***) to ***. Counseled on sick day rules. -{Meds adjust:18428} metformin *** to ***.  -Patient educated on purpose, proper use, and potential adverse effects of ***.  -Extensively discussed pathophysiology of diabetes, recommended lifestyle interventions, dietary effects on blood sugar control.  -Counseled on s/sx of and management of hypoglycemia.  -Next A1c anticipated ***.   ASCVD risk - primary prevention in patient with diabetes. Last LDL is *** not at goal of <70 *** mg/dL. ASCVD risk factors include *** and 10-year ASCVD risk score of ***. {Desc; low/moderate/high:110033} intensity statin indicated.  -{Meds adjust:18428} ***statin *** mg.   Hypertension longstanding *** currently ***. Blood pressure goal of <130/80 *** mmHg. Medication adherence ***. Blood pressure control is suboptimal due to ***. -***  Written patient instructions provided. Patient verbalized understanding of treatment plan.  Total time in face to face counseling *** minutes.    Follow-up:  Pharmacist ***. PCP clinic visit in November  Maryan Puls, PharmD PGY-1 [facility] Resident

## 2022-08-11 ENCOUNTER — Ambulatory Visit: Payer: Medicare Other | Admitting: Pharmacist

## 2022-08-17 ENCOUNTER — Other Ambulatory Visit: Payer: Self-pay | Admitting: Family Medicine

## 2022-08-17 ENCOUNTER — Telehealth (HOSPITAL_COMMUNITY): Payer: Self-pay

## 2022-08-17 DIAGNOSIS — M1A0410 Idiopathic chronic gout, right hand, without tophus (tophi): Secondary | ICD-10-CM

## 2022-08-17 NOTE — Telephone Encounter (Signed)
Patient is now discharged from [name].  Patient has/has not met the following goals:  Yes :Patient expresses basic understanding of medications and what they are for Yes :Patient able to verbalize heart failure specific dietary/fluid restrictions Yes :Patient is aware of who to call if they have medical concerns or if they need to schedule or change appts Yes :Patient has a scale for daily weights and weighs regularly Yes :Patient able to verbalize concerning symptoms when they should call the HF clinic (weight gain ranges, etc) Yes :Patient has a PCP and has seen within the past year or has upcoming appt Yes :Patient has reliable access to getting their medications Yes :Patient has shown they are able to reorder medications reliably No :Patient has had admission in past 30 days- if yes how many? No :Patient has had admission in past 90 days- if yes how many?  Discharge Comments:  Pt has been seen monthly. She is on bubble packs. She can order meds herself from pharmacy.  I called her to discuss needs but no answer.  She has not been weighing daily or using her cardiomems in some time. We have had conversations about this in the past too.  She has a lot of people living there with her.  She does not follow low salt/sugar diet despite numerous conversations about it. Will d/c for now.  Can add back on if needed.   [name], [place] 08/17/2022

## 2022-08-24 DIAGNOSIS — H34832 Tributary (branch) retinal vein occlusion, left eye, with macular edema: Secondary | ICD-10-CM | POA: Diagnosis not present

## 2022-08-30 ENCOUNTER — Other Ambulatory Visit: Payer: Self-pay | Admitting: Physician Assistant

## 2022-08-30 DIAGNOSIS — E1151 Type 2 diabetes mellitus with diabetic peripheral angiopathy without gangrene: Secondary | ICD-10-CM

## 2022-08-30 NOTE — Telephone Encounter (Signed)
Requested Prescriptions  Pending Prescriptions Disp Refills  . EASY COMFORT PEN NEEDLES 31G X 5 MM MISC [Pharmacy Med Name: EASY COMFORT PEN NEEDLES 31G X 5 MM] 100 each 1    Sig: USE FOUR TIMES A DAY FOR INSULIN ADMINISTRATION     Endocrinology: Diabetes - Testing Supplies Passed - 08/30/2022  9:48 AM      Passed - Valid encounter within last 12 months    Recent Outpatient Visits          1 month ago DM (diabetes mellitus), type 2 with peripheral vascular complications [facility])   [place], [place], [place]   9 months ago DM (diabetes mellitus), type 2 with peripheral vascular complications ([place])   [place], [place], [place]   11 months ago DM (diabetes mellitus), type 2 with peripheral vascular complications [facility])   [place], [place], [place]   1 year ago DM (diabetes mellitus), type 2 with peripheral vascular complications [facility])   [place], Charlane Ferretti, MD   1 year ago DM (diabetes mellitus), type 2 with peripheral vascular complications [facility])   [place], [name], [place]      Future Appointments            In 1 week Warren Lacy, PA-C [place] [facility] [place]   In 2 weeks Daisy Blossom, Jarome [place], [place]   In 1 month Charlott Rakes, MD [place]

## 2022-09-01 ENCOUNTER — Ambulatory Visit: Payer: Self-pay

## 2022-09-01 IMAGING — DX DG LUMBAR SPINE COMPLETE 4+V
5 series · 5 of 5 positions shown · non-contrast
Comparison: None.

CLINICAL DATA: Patient fell today.  Low back and right hip pain.

EXAM:
LUMBAR SPINE - COMPLETE 4+ VIEW

[l-spine obl (1 of 2)]
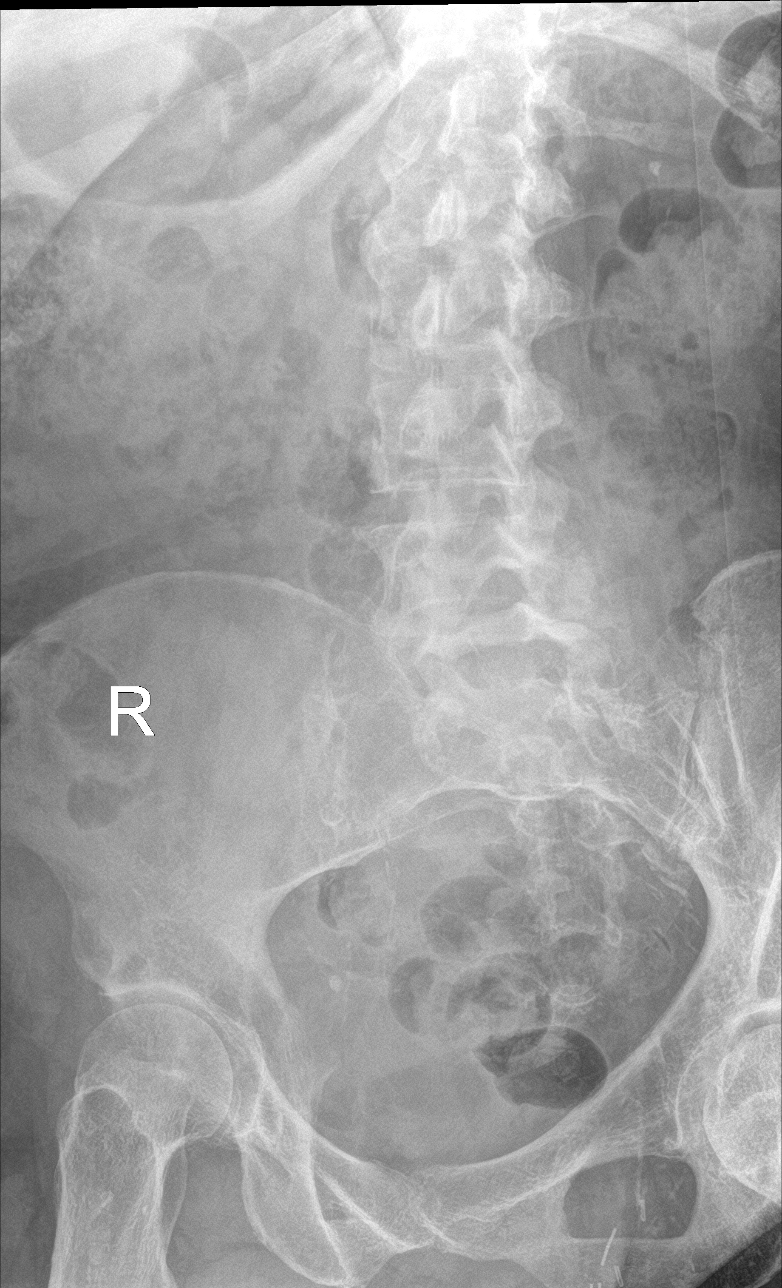

[l-spine obl (2 of 2)]
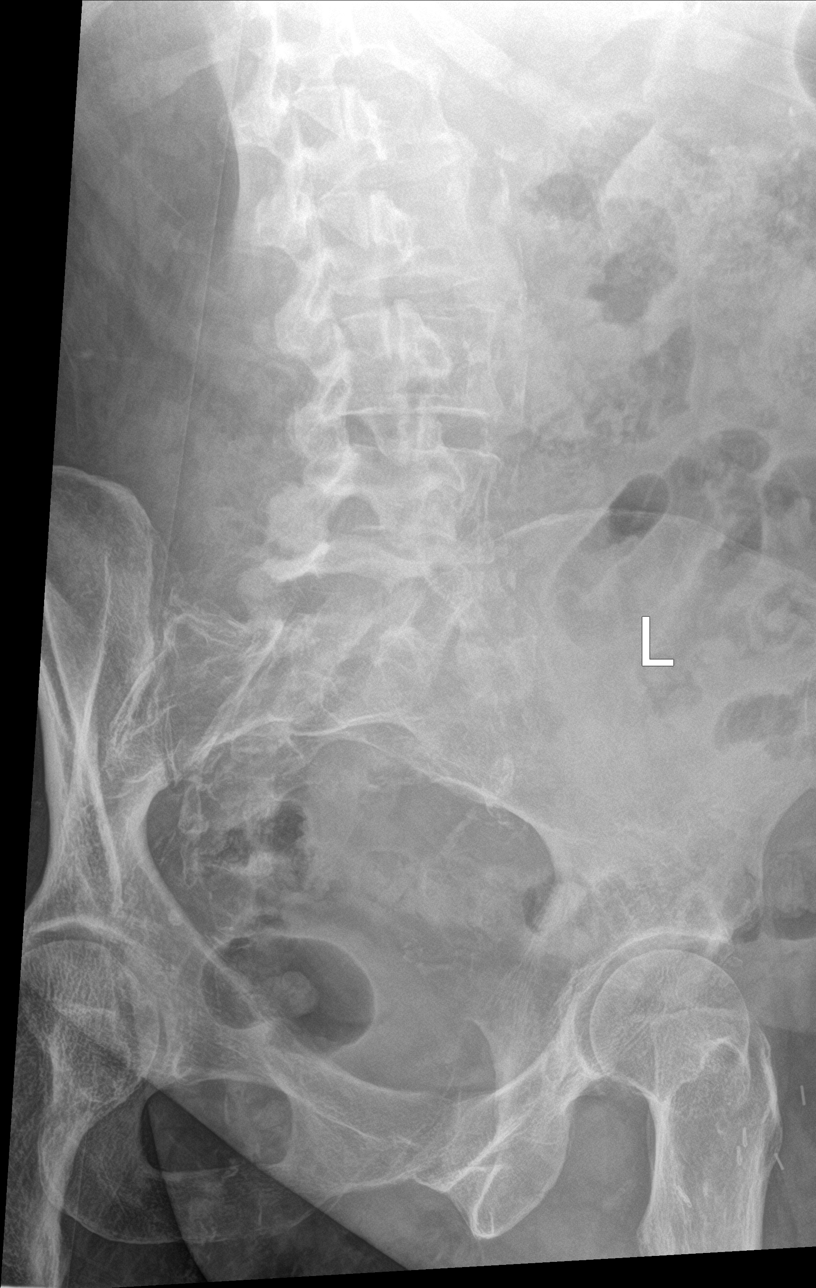

[l-spine spot]
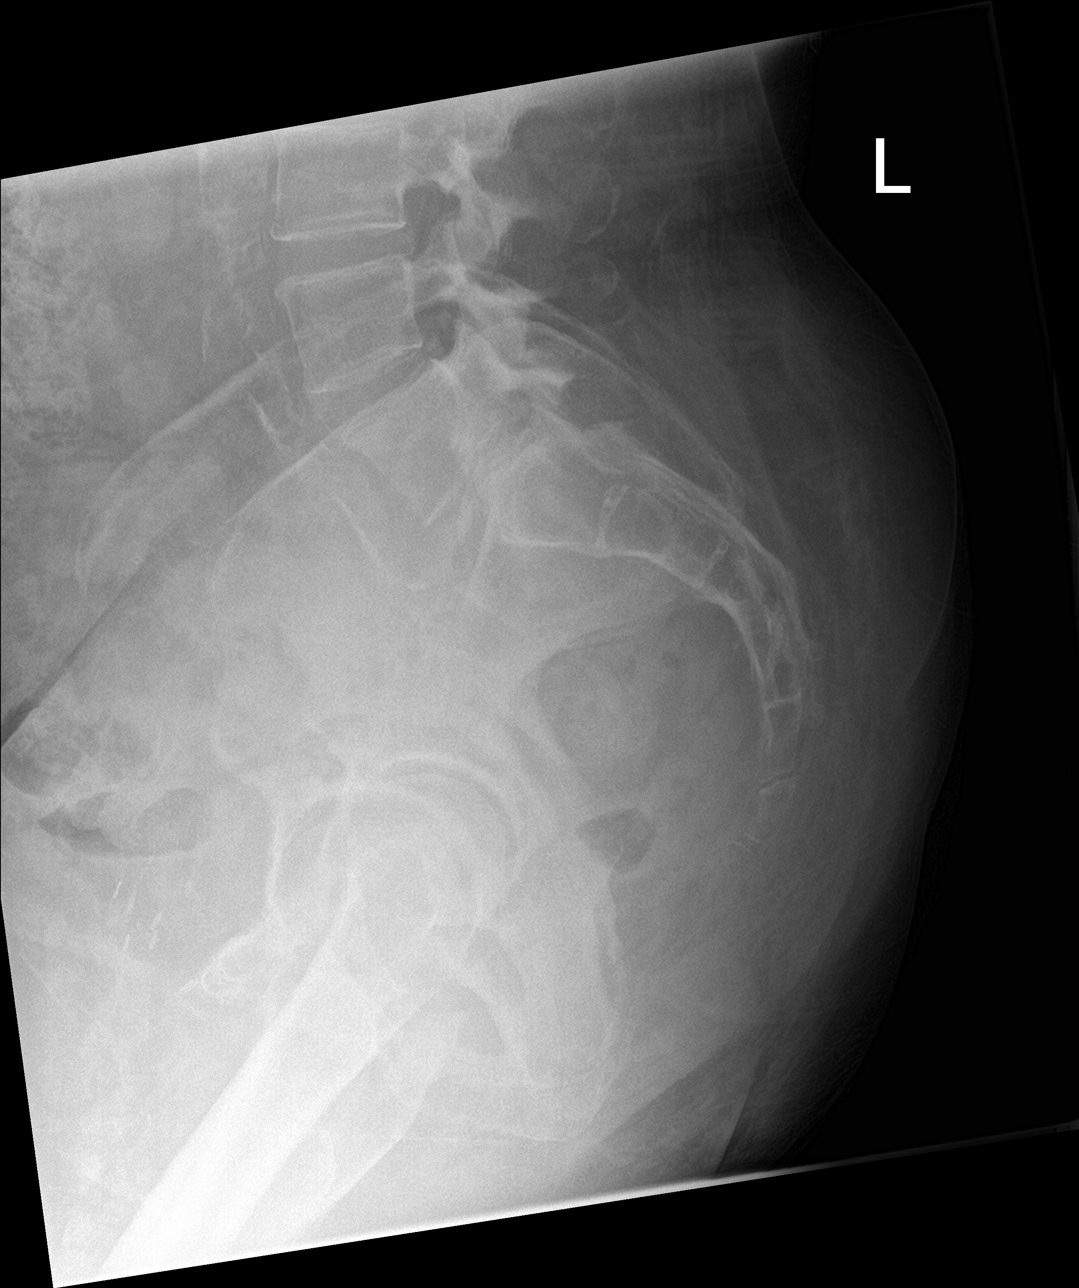

[l-spine ap]
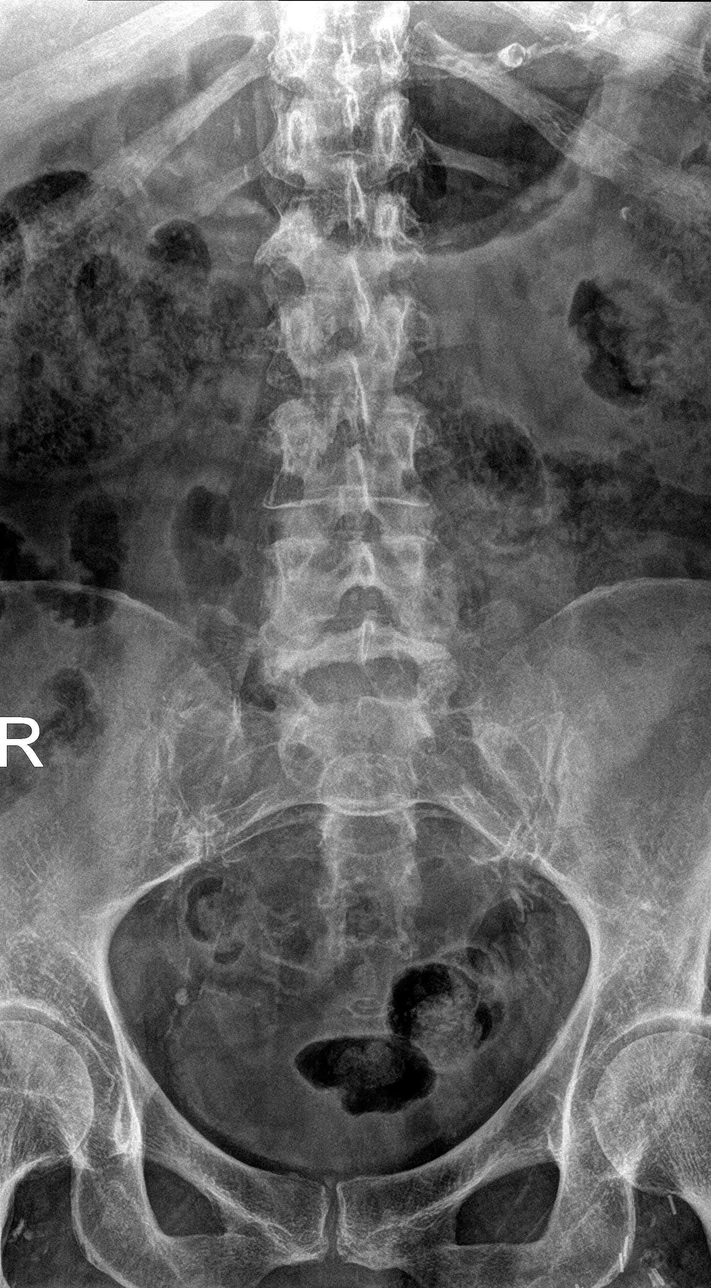

[l-spine lat]
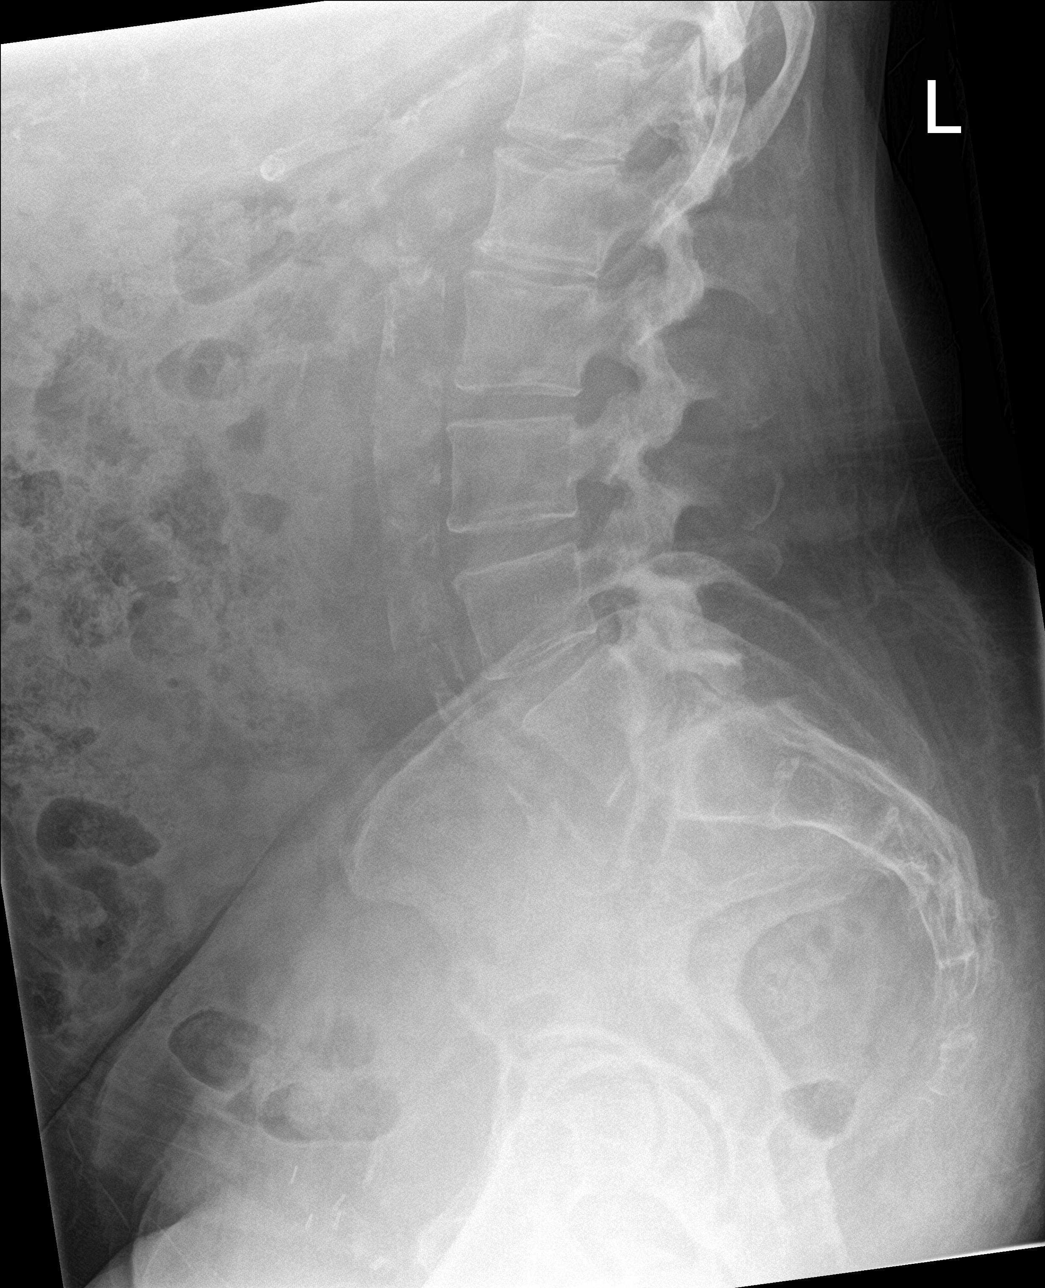

[5 of 5 positions shown; findings below may reference images not displayed]

FINDINGS: Five lumbar type vertebral bodies with sacralized L5. Normal
alignment. No vertebral compression deformities. No focal bone
lesion or bone destruction. Mild degenerative changes with narrowed
interspaces and endplate osteophyte formation most prominent in the
upper lumbar region and at the lumbosacral interspace. Degenerative
changes in the facet joints. Visualized sacrum appears intact.
Prominent aortic vascular calcification.
IMPRESSION: Normal alignment. No acute displaced fractures identified. No focal
bone lesions. Mild degenerative changes.

## 2022-09-01 IMAGING — DX DG HIP (WITH OR WITHOUT PELVIS) 2-3V*R*
3 series · 3 of 3 positions shown · non-contrast
Comparison: None.

CLINICAL DATA: Right hip pain after a fall.

EXAM:
DG HIP (WITH OR WITHOUT PELVIS) 2-3V RIGHT

[pelvis ap]
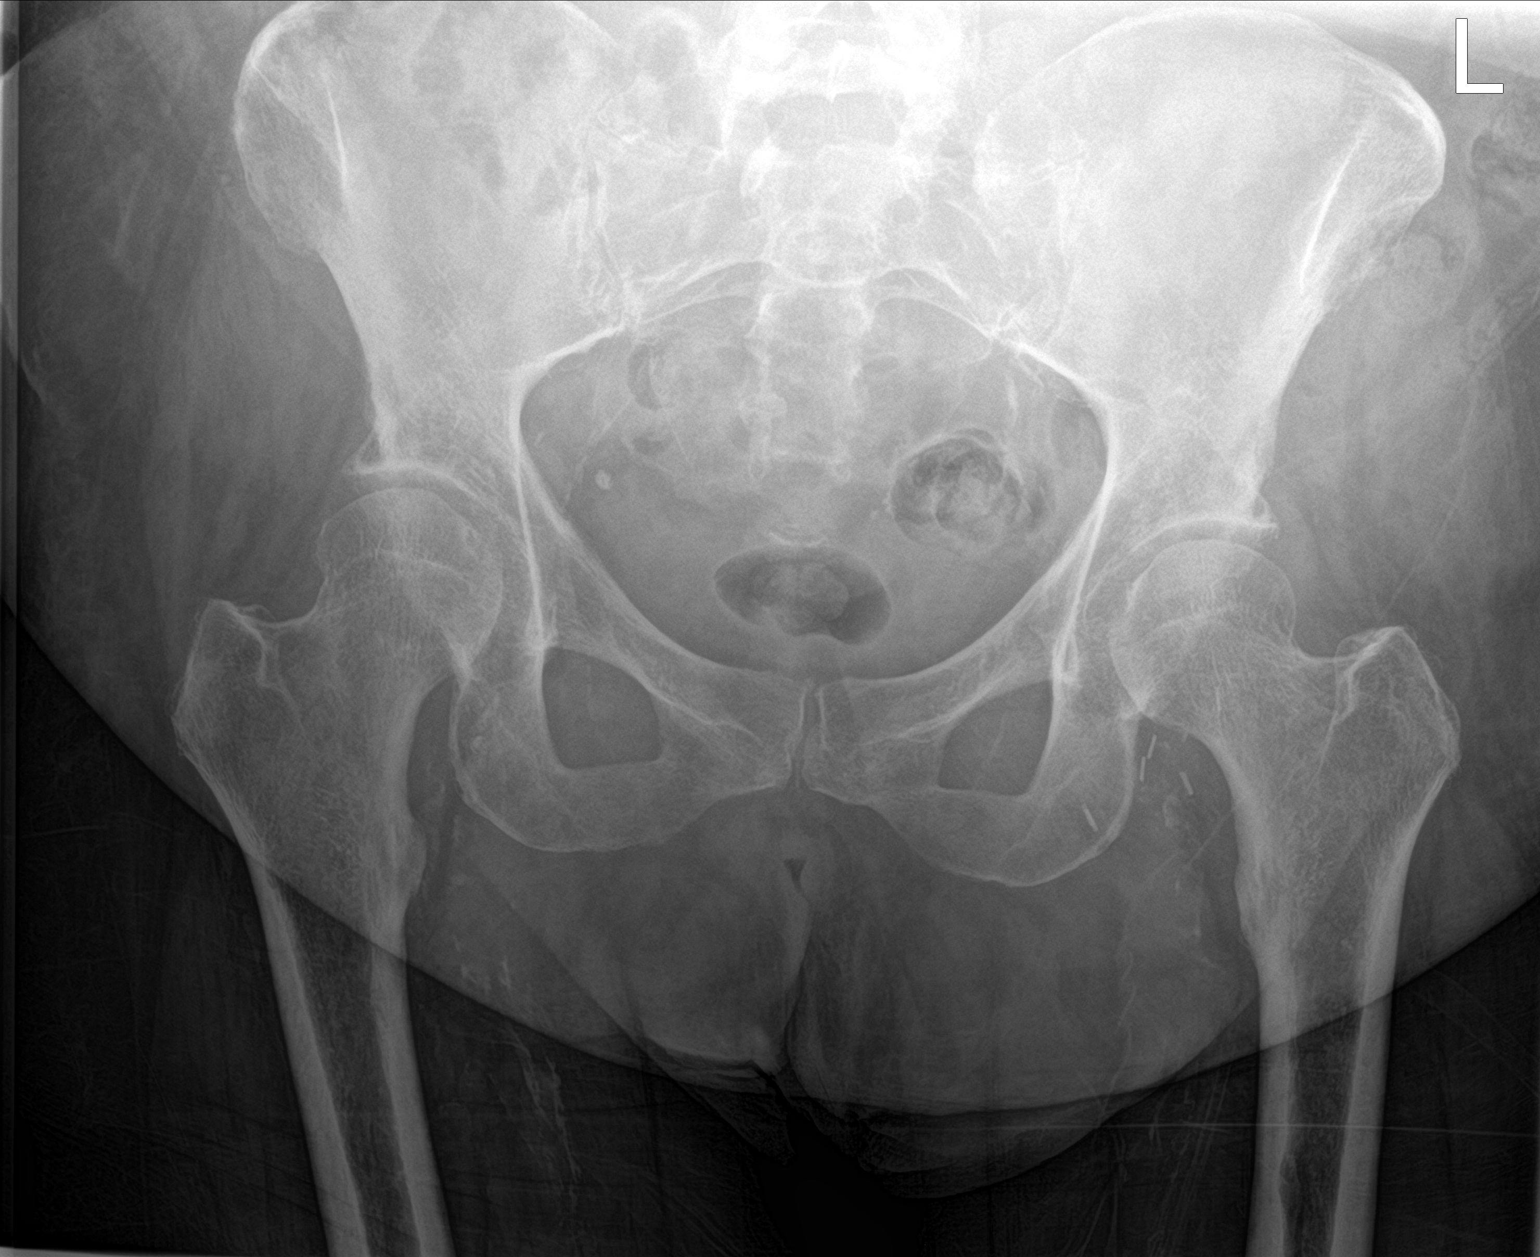

[hip ap]
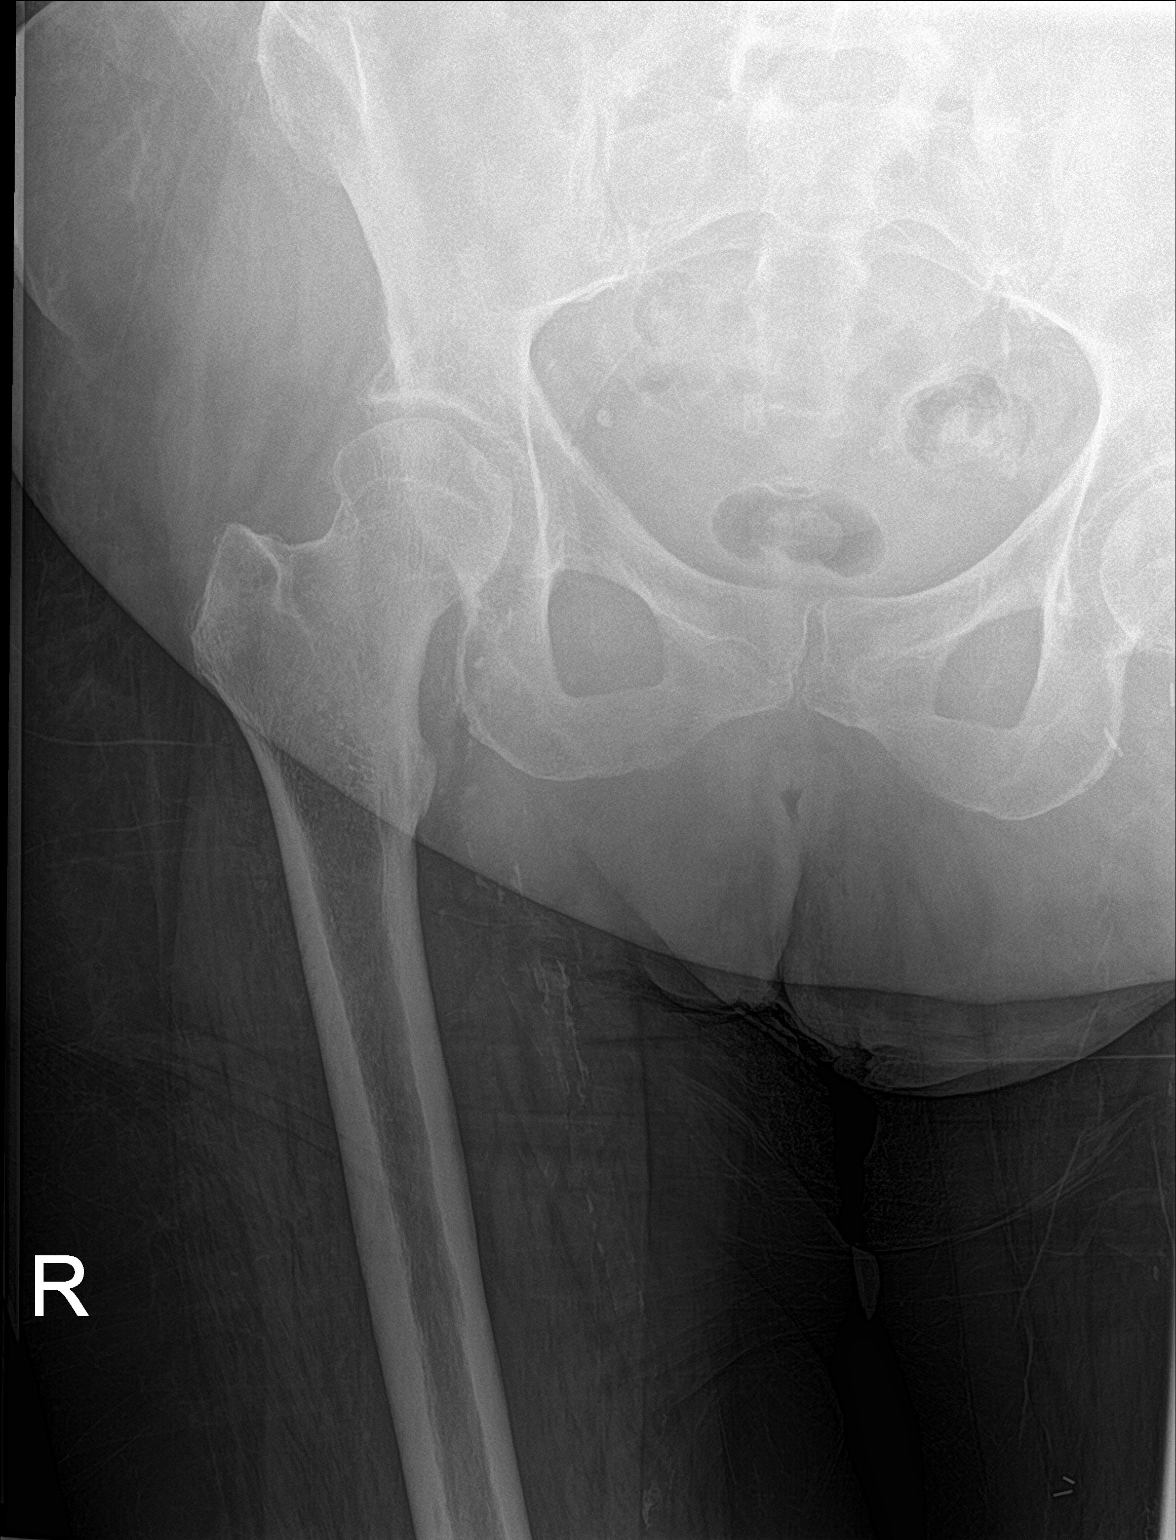

[hip lat]
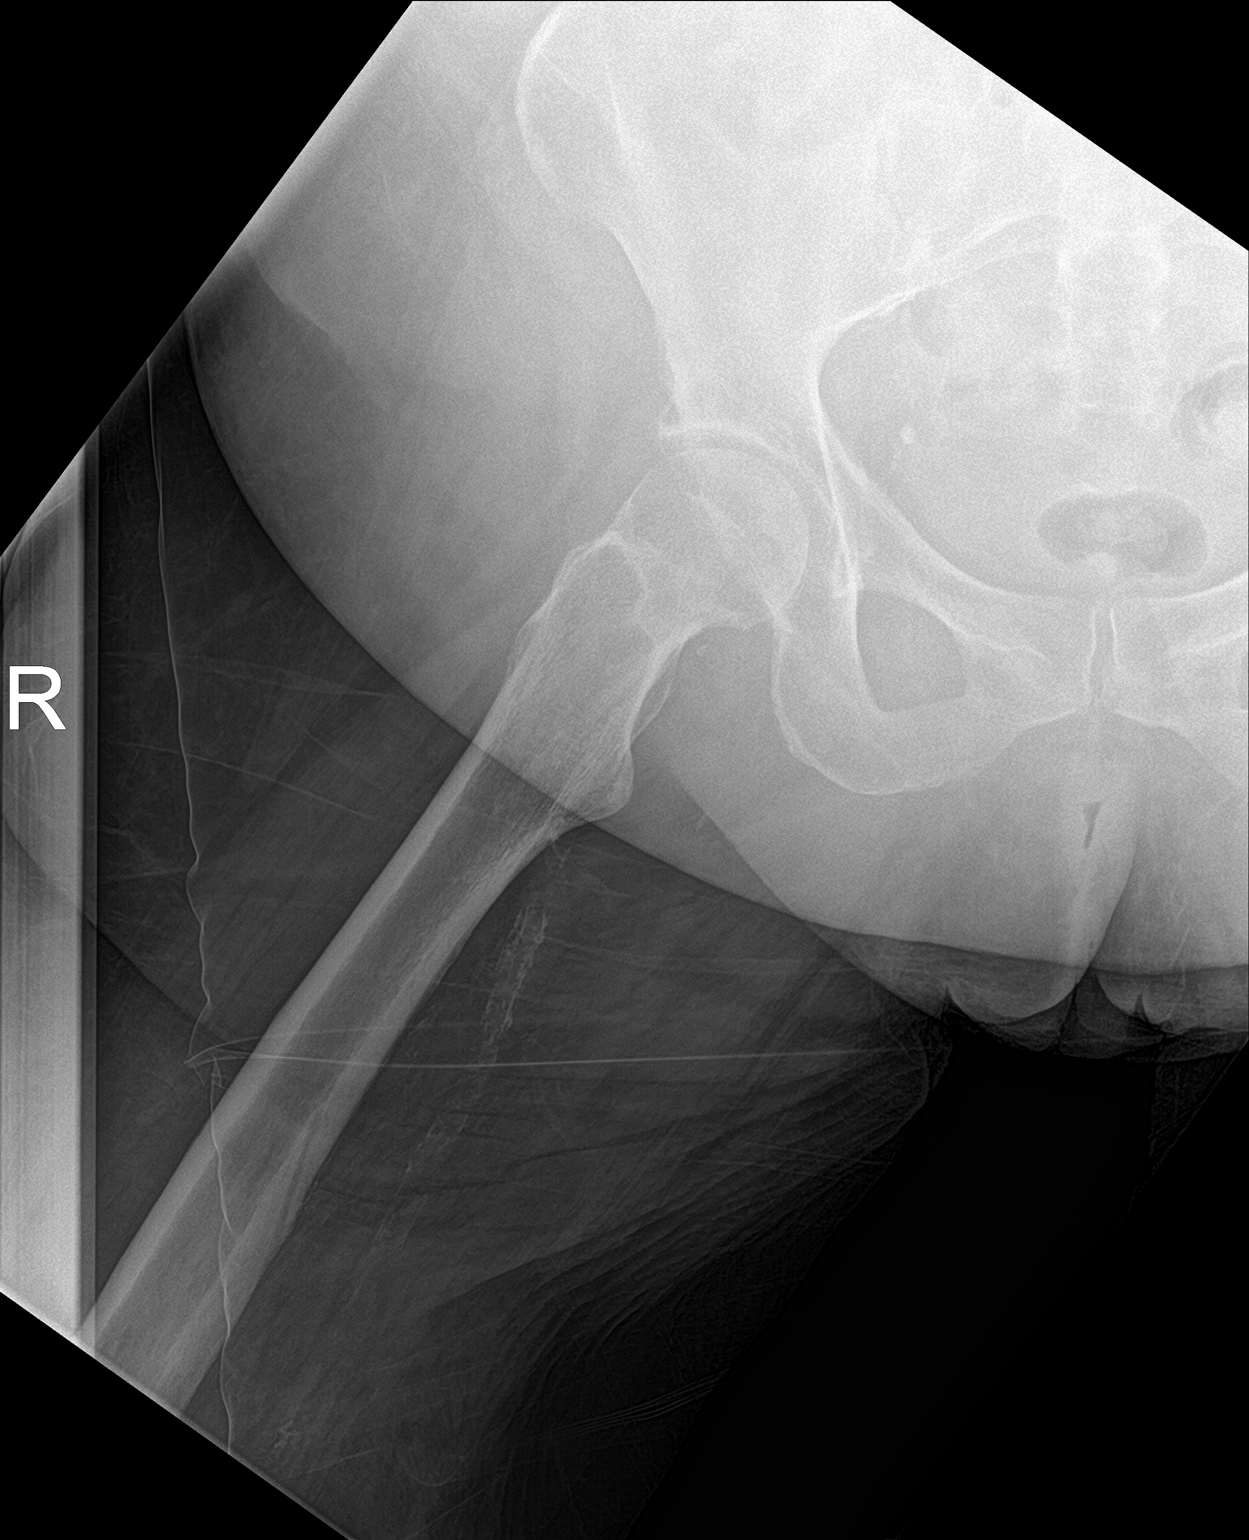

[3 of 3 positions shown; findings below may reference images not displayed]

FINDINGS: Mild valgus orientation to both hips with mild deformity of the
femoral heads possibly related to previous slipped capital femoral
epiphyses. Degenerative changes in the hip joints. Right hip
demonstrates no evidence of acute fracture or dislocation. No focal
bone lesion or bone destruction. Visualized pelvis appears intact.
Prominent vascular calcifications.
IMPRESSION: Chronic valgus orientation of the hips with mild degenerative
changes. No acute displaced fractures identified.

## 2022-09-01 NOTE — Patient Outreach (Signed)
  Care Coordination   09/01/2022 Name: [name] MRN: [id number] DOB: 08-02-1961   Care Coordination Outreach Attempts:  A second unsuccessful outreach was attempted today to offer the patient with information about available care coordination services as a benefit of their health plan.     Follow Up Plan:  Additional outreach attempts will be made to offer the patient care coordination information and services.   Encounter Outcome:  No Answer  Care Coordination Interventions Activated:  No   Care Coordination Interventions:  No, not indicated    Daneen Schick, BSW, CDP Social Worker, Certified Dementia Practitioner [facility]  Care Coordination [phone]

## 2022-09-05 NOTE — Progress Notes (Unsigned)
Cardiology Office Note:    Date:  09/07/2022   ID:  [name], DOB 1961-11-14, MRN [id number]  PCP:  Charlott Rakes, [place] Cardiologist: Quay Burow, MD  HF: Dr. Aundra Dubin  Reason for visit: 71-month follow-up  History of Present Illness:    [name] is a 61 y.o. female with a hx of  PAD, carotid stenosis s/p left CEA, relatively mild CAD, chronic systolic CHF, paroxysmal atrial fibrillation, 20 pack year tobacco use hx, COPD and prior substance abuse.    She last saw Dr. Gwenlyn Found in 12/2021.  With a nonhealing wound on her right heel during visit with me in 05/2021, LE angiography was done on 06/07/2021 & revealed a patent left femoropopliteal bypass graft, and occluded right SFA, popliteal and tibial vessels.  She underwent revascularization of a long right SFA CTO with directional atherectomy and drug-coated balloon angioplasty.  Her Dopplers showed improvement in her right SFA although her right ABI continues to be severely depressed at 0.46 because her popliteal and tibial vessel occlusion.  Dr. Gwenlyn Found did not think she has additional endovascular opportunities for inline flow.  Patient had recently stopped smoking.  She saw heart failure clinic in August 2023.  Volume status is followed by Cardio[facility] and paramedicine.  Her primary complaint was LE neuropathy and swelling.  She uses a cane and walker for ambulation.  With some LE edema, her torsemide was increased to 100 mg in the morning and 80 mg in the evening for 3 days then back to 80 mg twice daily.  Today, patient states that she is doing okay.  She mentions recent gas, burping and dry heaving a couple weeks ago that has improved.  She denies fever.  When asked about her legs, she has chronic numbness and tingling that stable.  She states she has not been walking as much as she should.  She has no current open wound.  She sees podiatry regularly for foot care.  She has a scab under her right metatarsal from  healed wound.  When asked about heart failure symptoms, she says she typically gets swelling in her throat and legs with fluid retention.  She has occasional leg swelling at the end of the day.  She denies shortness of breath, PND and orthopnea.  She has noticed some weight gain with increased eating after quitting smoking.   PMH: 1. Carotid stenosis: Known occluded right carotid.  Left CEA in 4/16.  - Carotid dopplers (7/21): CTO RICA, mild disease LICA.  - Carotid dopplers (7/22): CTO RICA, 5-46% LICA.  2. CAD: LHC in 12/15 with 80% stenosis in small OM1, nonobstructive disease in other territories.  3. Chronic systolic CHF: Nonischemic cardiomyopathy (?due to ETOH or prior drug abuse).  She has a Cardiomems.  - Echo (1/17) with EF 45%, mild LV hypertrophy, moderate diastolic dysfunction, inferolateral severe hypokinesis, mildly decreased RV systolic function.  - Echo (4/22): EF 55-60%, basal-mid inferolateral hypokinesis with grade II diastolic dysfunction, RV normal, PASP 42 4. Atrial fibrillation: Paroxysmal.   5. Type II diabetes 6. CKD: Stage III. 7. COPD: Smokes 1 ppd.  8. 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  No longer drinks.  9. Hypothyroidism 10. PAD: Atherectomy SFA in 2014 (Dr Gwenlyn Found).  Peripheral arterial dopplers (2/17) with focal 75-99% proximal right SFA stenosis, occluded mid-distal right SFA, chronic occlusion of all runoff arteries on right. - In 5/18, she had peripheral angiography with atherectomy of right SFA.   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  - 7/18 ABIs showed 0.65 on right, 0.31 on left.   - Peripheral angiography 8/20: Totally occluded left SFA.  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 Donnetta Hutching.   - ABIs (2/21): 0.44 R, 0.99 L.  - Balloon angioplasty right SFA 7/22.  - ABIs (8/22): severe right SFA disease.  11. Anemia 12. Prior cocaine abuse.  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  14. OSA: Has not been able to tolerate CPAP.  15. Diabetic neuropathy.    SH: Lives with sister.  Prior cocaine abuse.  Prior ETOH abuse.  Quit smoking in 1/17, restarted, and quit again in 4/19, restarted again.  Past Surgical History:  Procedure Laterality Date   ABDOMINAL AORTOGRAM N/A 06/26/2019   Procedure: ABDOMINAL AORTOGRAM;  Surgeon: Wellington Hampshire, MD;  Location: [place] [facility];  Service: Cardiovascular;  Laterality: N/A;   ABDOMINAL AORTOGRAM W/LOWER EXTREMITY N/A 06/07/2021   Procedure: ABDOMINAL AORTOGRAM W/LOWER EXTREMITY;  Surgeon: [name], MD;  Location: [place] [facility];  Service: Cardiovascular;  Laterality: N/A;   AMPUTATION Right 06/14/2017   Procedure: Right foot transmetatarsal amputation;  Surgeon: Newt Minion, MD;  Location: [facility];  Service: Orthopedics;  Laterality: Right;   AMPUTATION Left 06/28/2019   Procedure: AMPUTATION LEFT FIFTH TOE;  Surgeon: Rosetta Posner, MD;  Location: [facility];  Service: Vascular;  Laterality: Left;   AMPUTATION TOE Right 04/28/2017   Procedure: AMPUTATION OF RIGHT SECOND RAY;  Surgeon: Newt Minion, MD;  Location: [facility];  Service: Orthopedics;  Laterality: Right;   CARDIAC CATHETERIZATION     CARDIAC CATHETERIZATION N/A 01/12/2016   Procedure: Temporary Wire;  Surgeon: Minus Breeding, MD;  Location: [facility];  Service: Cardiovascular;  Laterality: N/A;   CARDIOVERSION  ~ 02/2013   "twice"    CAROTID ANGIOGRAM N/A 01/15/2015   Procedure: CAROTID ANGIOGRAM;  Surgeon: [name], MD;  Location: [facility];  Service: Cardiovascular;  Laterality: N/A;   DILATION AND CURETTAGE OF UTERUS  1988   ENDARTERECTOMY Left 02/19/2015   Procedure: LEFT CAROTID ENDARTERECTOMY ;  Surgeon: Serafina Mitchell, MD;  Location: [facility];  Service: Vascular;  Laterality: Left;   FEMORAL-TIBIAL BYPASS GRAFT Left 06/28/2019   Procedure: BYPASS GRAFT LEFT LEG FEMORAL TO ANTERIOR TIBIAL ARTERY  using LEFT GREATER SAPHENOUS VEIN;  Surgeon: Rosetta Posner, MD;  Location: [place];  Service: Vascular;  Laterality: Left;   LEFT HEART CATHETERIZATION WITH CORONARY ANGIOGRAM N/A 10/31/2014   Procedure: LEFT HEART CATHETERIZATION WITH CORONARY ANGIOGRAM;  Surgeon: Burnell Blanks, MD;  Location: [facility] LAB;  Service: Cardiovascular;  Laterality: N/A;   LOWER EXTREMITY ANGIOGRAM N/A 09/10/2013   Procedure: LOWER EXTREMITY ANGIOGRAM;  Surgeon: [name], MD;  Location: [facility] CATH LAB;  Service: Cardiovascular;  Laterality: N/A;   LOWER EXTREMITY ANGIOGRAM N/A 01/15/2015   Procedure: LOWER EXTREMITY ANGIOGRAM;  Surgeon: [name], MD;  Location: [facility] CATH LAB;  Service: Cardiovascular;  Laterality: N/A;   LOWER EXTREMITY ANGIOGRAPHY N/A 04/13/2017   Procedure: Lower Extremity Angiography;  Surgeon: [name], MD;  Location: [place] [facility];  Service: Cardiovascular;  Laterality: N/A;   LOWER EXTREMITY ANGIOGRAPHY Left 06/26/2019   Procedure: LOWER EXTREMITY ANGIOGRAPHY;  Surgeon: Wellington Hampshire, MD;  Location: [place] [facility];  Service: Cardiovascular;  Laterality: Left;   PERIPHERAL VASCULAR  ATHERECTOMY Right 06/07/2021   Procedure: PERIPHERAL VASCULAR ATHERECTOMY;  Surgeon: [name], MD;  Location: [place] [facility];  Service: Cardiovascular;  Laterality: Right;   PERIPHERAL VASCULAR BALLOON ANGIOPLASTY Left 06/26/2019   Procedure: PERIPHERAL VASCULAR BALLOON ANGIOPLASTY;  Surgeon: Wellington Hampshire, MD;  Location: [place] [facility];  Service: Cardiovascular;  Laterality: Left;  unable to cross lt sfa occlusion   PERIPHERAL VASCULAR INTERVENTION  04/13/2017   Procedure: Peripheral Vascular Intervention;  Surgeon: [name], MD;  Location: [place] [facility];  Service: Cardiovascular;;   PRESSURE SENSOR/CARDIOMEMS N/A 02/05/2020   Procedure: PRESSURE SENSOR/CARDIOMEMS;  Surgeon: Larey Dresser, MD;  Location: [place] [facility];  Service: Cardiovascular;   Laterality: N/A;   VEIN HARVEST Left 06/28/2019   Procedure: LEFT LEG GREATER SAPHENOUS VEIN HARVEST;  Surgeon: Rosetta Posner, MD;  Location: [facility];  Service: Vascular;  Laterality: Left;    Current Medications: Current Meds  Medication Sig   ACCU-CHEK GUIDE test strip USE TO CHECK BLOOD SUGAR FOUR TIMES DAILY   acetaminophen (TYLENOL) 500 MG tablet Take 1,000 mg by mouth every 6 (six) hours as needed for headache (pain).   albuterol (VENTOLIN HFA) 108 (90 Base) MCG/ACT inhaler USE 2 PUFFS BY MOUTH EVERY FOUR HOURS, AS NEEDED, FOR COUGHING/WHEEZING   allopurinol (ZYLOPRIM) 100 MG tablet TAKE 1 TABLET (100 MG TOTAL) BY MOUTH DAILY(AM)   amiodarone (PACERONE) 200 MG tablet TAKE 1/2 TABLET (100 MG TOTAL) BY MOUTH DAILY (AM)   amLODipine (NORVASC) 10 MG tablet Take 1 tablet (10 mg total) by mouth every morning.   Ascorbic Acid (VITAMIN C PO) Take 1 tablet by mouth every morning.   Aspirin-Salicylamide-Caffeine (BC HEADACHE POWDER PO) Take 1 packet by mouth 4 (four) times daily as needed (pain/headache).   budesonide-formoterol (SYMBICORT) 160-4.5 MCG/ACT inhaler Inhale 2 puffs into the lungs 2 (two) times daily.   buPROPion (WELLBUTRIN SR) 150 MG 12 hr tablet TAKE 1 TABLET BY MOUTH DAILY FOR 3 DAYS THEN INCREASE TO 1 TABLET BY MOUTH 2 TIMES DAILY(AM+BEDTIME)   Cholecalciferol (VITAMIN D3 PO) Take 1 capsule by mouth every morning.   colchicine 0.6 MG tablet TAKE 2 TABS (1.2 MG) AT THE ONSET OF A GOUT FLARE, MAY TAKE 1 TAB (0.6 MG) IN 1 HOUR IF SYMPTOMS   Continuous Blood Gluc Sensor (FREESTYLE LIBRE 2 SENSOR) MISC USE 1 (ONE) EACH EVERY 2 WEEKS   Dulaglutide (TRULICITY) 1.61 WR/6.0AV SOPN Inject 0.75 mg into the skin every Friday.   EASY COMFORT PEN NEEDLES 31G X 5 MM MISC USE FOUR TIMES A DAY FOR INSULIN ADMINISTRATION   ELIQUIS 5 MG TABS tablet TAKE 1 TABLET (5 MG TOTAL) BY MOUTH 2 (TWO) TIMES DAILY (AM+BEDTIME)   empagliflozin (JARDIANCE) 10 MG TABS tablet Take 1 tablet (10 mg total) by mouth  daily. TAKE 1 TABLET before breakfast daily   FEROSUL 325 (65 Fe) MG tablet TAKE 1 TABLET (324 MG TOTAL) BY MOUTH 2 (TWO) TIMES DAILY AS NEEDED. (AM+BEDTIME)   FLUoxetine (PROZAC) 20 MG capsule TAKE 1 CAPSULE (20 MG TOTAL) BY MOUTH AT BEDTIME.   folic acid (FOLVITE) 1 MG tablet Take 1 tablet (1 mg total) by mouth daily.   hydrALAZINE (APRESOLINE) 50 MG tablet TAKE 1 & 1/2 TABLETS (75 MG TOTAL) BY MOUTH 3 (THREE) TIMES DAILY (AM+NOON+BEDTIME)   icosapent Ethyl (VASCEPA) 1 g capsule Take 2 capsules (2 g total) by mouth 2 (two) times daily.   insulin lispro (HUMALOG) 100 UNIT/ML KwikPen Inject 8 Units  into the skin 3 (three) times daily with meals.   LANTUS SOLOSTAR 100 UNIT/ML Solostar Pen Inject 30 Units into the skin daily.   levothyroxine (SYNTHROID) 50 MCG tablet TAKE 1 TABLET (50 MCG TOTAL) BY MOUTH DAILY BEFORE BREAKFAST (AM)   losartan (COZAAR) 25 MG tablet TAKE 2 TABLETS (50 MG TOTAL) BY MOUTH DAILY (AM)   Omega-3 Fatty Acids (FISH OIL PO) Take 1 capsule by mouth every morning.   ondansetron (ZOFRAN) 4 MG tablet Take 1 tablet (4 mg total) by mouth every 8 (eight) hours as needed for nausea or vomiting.   pantoprazole (PROTONIX) 40 MG tablet TAKE 1 TABLET (40 MG TOTAL) BY MOUTH DAILY(AM)   rosuvastatin (CRESTOR) 40 MG tablet Take 1 tablet (40 mg total) by mouth daily.   spironolactone (ALDACTONE) 25 MG tablet Take 0.5 tablets (12.5 mg total) by mouth daily.   torsemide (DEMADEX) 20 MG tablet TAKE 100 MG (5 TABLETS) IN THE MORNING AND 80 MG (4 TABLETS) IN THE EVENING.   triamcinolone 0.1% oint-Cerave equivalent lotion 1:1 mixture Apply topically 2 (two) times daily as needed.   [DISCONTINUED] atorvastatin (LIPITOR) 40 MG tablet Take 40 mg by mouth daily.     Allergies:   Gabapentin and Lyrica [pregabalin]   Social History   Socioeconomic History   Marital status: Single    Spouse name: Not on file   Number of children: 1   Years of education: 12   Highest education level: 12th grade   Occupational History   Occupation: disabled  Tobacco Use   Smoking status: Former    Packs/day: 1.00    Years: 44.00    Total pack years: 44.00    Types: E-cigarettes, Cigarettes   Smokeless tobacco: Former    Types: Snuff  Vaping Use   Vaping Use: Former   Devices: 11/26/2018 "stopped months ago"  Substance and Sexual Activity   Alcohol use: Yes    Comment: occ   Drug use: Yes    Types: "Crack" cocaine, Marijuana, Oxycodone   Sexual activity: Not Currently  Other Topics Concern   Not on file  Social History Narrative   ** Merged History Encounter **       Lives in [place], in motel with sister.  They are looking to move but don't have a place to go yet.     Social Determinants of Health   Financial Resource Strain: Low Risk  (09/11/2020)   Overall Financial Resource Strain (CARDIA)    Difficulty of Paying Living Expenses: Not very hard  Food Insecurity: No Food Insecurity (01/28/2020)   Hunger Vital Sign    Worried About Running Out of Food in the Last Year: Never true    Ran Out of Food in the Last Year: Never true  Transportation Needs: No Transportation Needs (01/28/2020)   PRAPARE - Hydrologist (Medical): No    Lack of Transportation (Non-Medical): No  Physical Activity: Insufficiently Active (01/28/2020)   Exercise Vital Sign    Days of Exercise per Week: 1 day    Minutes of Exercise per Session: 10 min  Stress: Not on file  Social Connections: Not on file     Family History: The patient's family history includes Breast cancer in her mother; Cancer in her mother; Clotting disorder in her mother; Diabetes in her mother; Emphysema in her sister; Heart attack in her mother; Heart disease in her father and mother; Hypertension in her father and mother.  ROS:   Please see  the history of present illness.     EKGs/Labs/Other Studies Reviewed:    Recent Labs: 04/04/2022: TSH 3.109 04/21/2022: Hemoglobin 11.0; Magnesium 2.4; Platelets  216 07/07/2022: ALT 14; BUN 51; Creatinine, Ser 2.11; Potassium 4.7; Sodium 136 07/08/2022: B Natriuretic Peptide 225.5   Recent Lipid Panel Lab Results  Component Value Date/Time   CHOL 203 (H) 07/08/2022 02:54 PM   TRIG 315 (H) 07/08/2022 02:54 PM   HDL 47 07/08/2022 02:54 PM   LDLCALC 93 07/08/2022 02:54 PM   LDLDIRECT 90.8 04/04/2022 03:07 PM    Physical Exam:    VS:  BP 110/66 (BP Location: Left Arm, Patient Position: Sitting, Cuff Size: Large)   Pulse 86   Ht 5\' 4"  (1.626 m)   Wt 225 lb (102.1 kg)   LMP  (LMP Unknown)   BMI 38.62 kg/m    No data found.       Wt Readings from Last 3 Encounters:  09/07/22 225 lb (102.1 kg)  07/08/22 228 lb (103.4 kg)  07/07/22 226 lb 6.4 oz (102.7 kg)     GEN:  Well nourished, well developed in no acute distress, in wheelchair HEENT: Normal NECK: No JVD; right carotid bruit CARDIAC: RRR, no murmurs, rubs, gallops RESPIRATORY: Rhonchi bilaterally, no wheezing ABDOMEN: Soft, non-tender, non-distended MUSCULOSKELETAL: 2+ LE edema to her knees bilaterally with pinkish hue. SKIN: Warm and dry; no open ulcers.  Scab under right first metatarsal NEUROLOGIC:  Alert and oriented PSYCHIATRIC:  Normal affect    ASSESSMENT AND PLAN   PAD, stable -2018: atherectomy of right SFA 2018, right transmetatarsal amputation  -2020: left fem-pop bypass + left 5th toe amputation by Dr. Donnetta Hutching -2022: drug-coated balloon angioplasty of the right SFA after being admitted for critical ischemia of the right foot.   -Dopplers 2/23 showed patent right SFA -Leg pain/wound healing complicated by DM neuropathy  -Patient has quit smoking. -She has repeat duplex ordered in 12/2022  (yearly).  Chronic diastolic heart failure -Preserved EF -Followed by heart failure clinic, has Cardio[facility] and paramedicine f/u. -Continue torsemide, spironolactone, losartan and Jardiance. -Bisoprolol discontinued secondary to bradycardia. -She has follow-up with Dr. Aundra Dubin in  December. -Check BMET today with recent AKI on CKD.    Coronary artery disease, no angina -LHC in 12/15 with 80% stenosis in small OM1, nonobstructive disease in other territories. -Continue statin.  Not on aspirin secondary to Eliquis.  Carotid artery disease -known occluded right internal carotid artery with a widely patent left carotid endarterectomy site. -Monitored with yearly carotid duplex  Paroxysmal atrial fibrillation -On amiodarone 100 mg daily. -Continue Eliquis for stroke prevention; no bleeding issues   Hypertension, well controlled -Continue current medications. -Goal BP is <130/80.  Recommend DASH diet (high in vegetables, fruits, low-fat dairy products, whole grains, poultry, fish, and nuts and low in sweets, sugar-sweetened beverages, and red meats), salt restriction and increase physical activity.  Hyperlipidemia with goal LDL less than 70 -LDL 93 in August 2023.  Recommend changing Lipitor to Crestor 40 mg daily.  Check fasting lipids in 3 months. -Discussed cholesterol lowering diets - Mediterranean diet, DASH diet, vegetarian diet, low-carbohydrate diet and avoidance of trans fats.  Discussed healthier choice substitutes.  Nuts, high-fiber foods, and fiber supplements may also improve lipids.    Obesity -Discussed how even a 5-10% weight loss can have cardiovascular benefits.   -Recommend moderate intensity activity for 30 minutes 5 days/week and the DASH diet.  CKD -Creatinine was 2.1 in August 2023 -prior to that creatinine was 1.6  in June. -Patient states her torsemide was decreased to 80 mg in the morning and 60 mg at night. -Recheck BMET today.  Disposition - Follow-up in February with Dr. Gwenlyn Found as previously scheduled.   Medication Adjustments/Labs and Tests Ordered: Current medicines are reviewed at length with the patient today.  Concerns regarding medicines are outlined above.  Orders Placed This Encounter  Procedures   Basic metabolic panel    Lipid panel   Meds ordered this encounter  Medications   rosuvastatin (CRESTOR) 40 MG tablet    Sig: Take 1 tablet (40 mg total) by mouth daily.    Dispense:  90 tablet    Refill:  3    Patient Instructions  Medication Instructions:  STOP Lipitor (Atorvastatin) START Crestor (Rosuvastatin) 40 mg daily *If you need a refill on your cardiac medications before your next appointment, please call your pharmacy*  Lab Work: Your physician recommends that you return for lab work TODAY:  BMP  Your physician recommends that you return for lab work in 3 months:  Fasting Lipid Panel-DO NOT eat or drink past midnight. Okay to have water the morning of  If you have labs (blood work) drawn today and your tests are completely normal, you will receive your results only by: [place] (if you have MyChart) OR A paper copy in the mail If you have any lab test that is abnormal or we need to change your treatment, we will call you to review the results.  Testing/Procedures: NONE ordered at this time of appointment   Follow-Up: At [facility], you and your health needs are our priority.  As part of our continuing mission to provide you with exceptional heart care, we have created designated Provider Care Teams.  These Care Teams include your primary Cardiologist (physician) and Advanced Practice Providers (APPs -  Physician Assistants and Nurse Practitioners) who all work together to provide you with the care you need, when you need it.   Your next appointment:   6 month(s)  The format for your next appointment:   In Person  Provider:   Quay Burow, MD     Other Instructions  Important Information About Sugar         Signed, Warren Lacy, PA-C  09/07/2022 1:12 PM    [facility]

## 2022-09-07 ENCOUNTER — Ambulatory Visit: Payer: Medicare Other | Attending: Physician Assistant | Admitting: Physician Assistant

## 2022-09-07 VITALS — BP 110/66 | HR 86 | Ht 64.0 in | Wt 225.0 lb

## 2022-09-07 DIAGNOSIS — I5032 Chronic diastolic (congestive) heart failure: Secondary | ICD-10-CM

## 2022-09-07 DIAGNOSIS — I739 Peripheral vascular disease, unspecified: Secondary | ICD-10-CM | POA: Diagnosis not present

## 2022-09-07 DIAGNOSIS — E1169 Type 2 diabetes mellitus with other specified complication: Secondary | ICD-10-CM

## 2022-09-07 DIAGNOSIS — E785 Hyperlipidemia, unspecified: Secondary | ICD-10-CM | POA: Diagnosis not present

## 2022-09-07 DIAGNOSIS — I480 Paroxysmal atrial fibrillation: Secondary | ICD-10-CM | POA: Diagnosis not present

## 2022-09-07 DIAGNOSIS — I2510 Atherosclerotic heart disease of native coronary artery without angina pectoris: Secondary | ICD-10-CM

## 2022-09-07 DIAGNOSIS — I10 Essential (primary) hypertension: Secondary | ICD-10-CM

## 2022-09-07 DIAGNOSIS — I5042 Chronic combined systolic (congestive) and diastolic (congestive) heart failure: Secondary | ICD-10-CM

## 2022-09-07 MED ORDER — ROSUVASTATIN CALCIUM 40 MG PO TABS: 40.0000 mg | ORAL_TABLET | Freq: Every day | ORAL | 3 refills | Status: DC

## 2022-09-07 NOTE — Patient Instructions (Addendum)
Medication Instructions:  STOP Lipitor (Atorvastatin) START Crestor (Rosuvastatin) 40 mg daily *If you need a refill on your cardiac medications before your next appointment, please call your pharmacy*  Lab Work: Your physician recommends that you return for lab work TODAY:  BMP  Your physician recommends that you return for lab work in 3 months:  Fasting Lipid Panel-DO NOT eat or drink past midnight. Okay to have water the morning of  If you have labs (blood work) drawn today and your tests are completely normal, you will receive your results only by: [place] (if you have MyChart) OR A paper copy in the mail If you have any lab test that is abnormal or we need to change your treatment, we will call you to review the results.  Testing/Procedures: NONE ordered at this time of appointment   Follow-Up: At [facility], you and your health needs are our priority.  As part of our continuing mission to provide you with exceptional heart care, we have created designated Provider Care Teams.  These Care Teams include your primary Cardiologist (physician) and Advanced Practice Providers (APPs -  Physician Assistants and Nurse Practitioners) who all work together to provide you with the care you need, when you need it.   Your next appointment:   6 month(s)  The format for your next appointment:   In Person  Provider:   Quay Burow, MD     Other Instructions  Important Information About Sugar

## 2022-09-08 LAB — BASIC METABOLIC PANEL
BUN/Creatinine Ratio: 19 (ref 9–23)
BUN: 26 mg/dL — ABNORMAL HIGH (ref 6–24)
CO2: 28 mmol/L (ref 20–29)
Calcium: 8.8 mg/dL (ref 8.7–10.2)
Chloride: 96 mmol/L (ref 96–106)
Creatinine, Ser: 1.35 mg/dL — ABNORMAL HIGH (ref 0.57–1.00)
Glucose: 254 mg/dL — ABNORMAL HIGH (ref 70–99)
Potassium: 3.4 mmol/L — ABNORMAL LOW (ref 3.5–5.2)
Sodium: 140 mmol/L (ref 134–144)
eGFR: 45 mL/min/{1.73_m2} — ABNORMAL LOW (ref >59)

## 2022-09-13 ENCOUNTER — Ambulatory Visit: Payer: Medicare Other | Attending: Family Medicine | Admitting: Pharmacist

## 2022-09-13 ENCOUNTER — Encounter: Payer: Self-pay | Admitting: Pharmacist

## 2022-09-13 DIAGNOSIS — E1151 Type 2 diabetes mellitus with diabetic peripheral angiopathy without gangrene: Secondary | ICD-10-CM | POA: Diagnosis not present

## 2022-09-13 LAB — POCT GLYCOSYLATED HEMOGLOBIN (HGB A1C): HbA1c, POC (controlled diabetic range): 9.6 % — AB (ref 0.0–7.0)

## 2022-09-13 MED ORDER — LANTUS SOLOSTAR 100 UNIT/ML ~~LOC~~ SOPN: 34.0000 [IU] | PEN_INJECTOR | Freq: Every day | SUBCUTANEOUS | 3 refills | Status: DC

## 2022-09-13 MED ORDER — INSULIN LISPRO (1 UNIT DIAL) 100 UNIT/ML (KWIKPEN): 10.0000 [IU] | PEN_INJECTOR | Freq: Three times a day (TID) | SUBCUTANEOUS | 3 refills | Status: DC

## 2022-09-13 NOTE — Progress Notes (Signed)
    S:     No chief complaint on file.  61 y.o. female who presents for diabetes evaluation, education, and management. PMH is significant for PAD (status post  LLE bypass drug-coated balloon angioplasty of right SFA secondary to critical right limb ischemia), carotid stenosis s/p left CEA, HFrEF (EF 55 to 60%, grade 2 DD), CAD, paroxysmal A. fib, previous substance abuse, type 2 diabetes mellitus(A1c 10.5), right foot transmetatarsal amputation, stage III CKD, liver cirrhosis, hypothyroidism, COPD, hypertension, Anxiety and Depression, tobacco abuse.   Patient was referred and last seen by Freeman Caldron on 07/07/2022.   Today, patient arrives in good spirits and presents without any assistance.   Patient reports Diabetes is longstanding. Her DM is complicated by CKD, peripheral vascular disease, neuropathy, retinopathy, hx of lower limb ulcers/OM, CHF, and CAD. She has been on insulin for some time. She is on GLP-1 and SGLT-2i therapy. She cannot tolerate metformin and was unable to tolerate higher doses of Trulicity. She has no complaints regarding her insulin, Trulicity, or Jardiance today. She has no hx of pancreatitis.   Family/Social History:  -Fhx: HTN, DM, clotting disorder, MI -Tobacco: former 1 PPD smoker  -Alcohol: none reported   Current diabetes medications include: Jardiance 10 mg daily, Lantus 30u daily, Humalog 8u TID, and Trulicity 7.90 mg weekly.   Patient reports adherence to taking all medications as prescribed.   Insurance coverage: [employer]  Patient denies hypoglycemic events.  Reported home fasting blood sugars: 160s-190s. Tells me she sometimes will see numbers >200 but not often since last visit with Levada Dy.    Patient denies polyuria. Patient reports neuropathy (nerve pain). Patient reports visual changes. Patient reports self foot exams.   Patient reported dietary habits:  -Tries to adhere to a diabetic diet   Patient-reported exercise habits:   -None -Ambulation is limited    O:   ROS  Physical Exam  7 day average blood glucose: no GM with her today  No CGM in place.    Lab Results  Component Value Date   HGBA1C 9.6 (A) 09/13/2022   There were no vitals filed for this visit.  Lipid Panel     Component Value Date/Time   CHOL 203 (H) 07/08/2022 1454   TRIG 315 (H) 07/08/2022 1454   HDL 47 07/08/2022 1454   CHOLHDL 4.3 07/08/2022 1454   VLDL 63 (H) 07/08/2022 1454   LDLCALC 93 07/08/2022 1454   LDLDIRECT 90.8 04/04/2022 1507    Clinical Atherosclerotic Cardiovascular Disease (ASCVD): Yes   A/P: Diabetes longstanding currently uncontrolled. A1c today reveals some improvement but is still elevated. Patient is able to verbalize appropriate hypoglycemia management plan. Medication adherence appears optimal. -Increased dose of Lantus to 34u daily. -Increased dose of Humalog to 10u TID.  -Continued Jardiance, Trulicity at current doses.  -Patient educated on purpose, proper use, and potential adverse effects of insulin.  -Extensively discussed pathophysiology of diabetes, recommended lifestyle interventions, dietary effects on blood sugar control.  -Counseled on s/sx of and management of hypoglycemia.  -Next A1c anticipated 11/2022.   Written patient instructions provided. Patient verbalized understanding of treatment plan. Total time in face to face counseling 30 minutes.    Follow-up: PCP clinic visit next month.  Benard Halsted, PharmD, [facility], [place] [phone]

## 2022-09-15 ENCOUNTER — Telehealth: Payer: Self-pay

## 2022-09-15 NOTE — Telephone Encounter (Addendum)
Called patient regarding results. Patient had understanding of results.----- Message from Warren Lacy, PA-C sent at 09/13/2022  8:53 AM EDT ----- Good news -- Your kidney function has greatly improved since last check.   Glucoses continues to be high -- follow-up with PCP/ follow diabetic diet.  Recommendations -Continue follow-up with heart failure clinic, [facility] monitoring and paramedicine. -Safe to continue torsemide, spironolactone, losartan and Jardiance.

## 2022-10-07 ENCOUNTER — Other Ambulatory Visit: Payer: Self-pay | Admitting: Physician Assistant

## 2022-10-07 ENCOUNTER — Other Ambulatory Visit (HOSPITAL_COMMUNITY): Payer: Self-pay | Admitting: Cardiology

## 2022-10-07 ENCOUNTER — Other Ambulatory Visit: Payer: Self-pay | Admitting: Family Medicine

## 2022-10-07 DIAGNOSIS — I10 Essential (primary) hypertension: Secondary | ICD-10-CM

## 2022-10-07 DIAGNOSIS — F32A Depression, unspecified: Secondary | ICD-10-CM

## 2022-10-07 DIAGNOSIS — M109 Gout, unspecified: Secondary | ICD-10-CM

## 2022-10-10 ENCOUNTER — Encounter: Payer: Self-pay | Admitting: Family Medicine

## 2022-10-10 ENCOUNTER — Ambulatory Visit: Payer: Medicare Other | Attending: Family Medicine | Admitting: Family Medicine

## 2022-10-10 VITALS — BP 129/73 | HR 83 | Ht 64.0 in

## 2022-10-10 DIAGNOSIS — Z1159 Encounter for screening for other viral diseases: Secondary | ICD-10-CM

## 2022-10-10 DIAGNOSIS — Z1211 Encounter for screening for malignant neoplasm of colon: Secondary | ICD-10-CM

## 2022-10-10 DIAGNOSIS — E1151 Type 2 diabetes mellitus with diabetic peripheral angiopathy without gangrene: Secondary | ICD-10-CM | POA: Diagnosis not present

## 2022-10-10 DIAGNOSIS — Z23 Encounter for immunization: Secondary | ICD-10-CM | POA: Diagnosis not present

## 2022-10-10 DIAGNOSIS — E039 Hypothyroidism, unspecified: Secondary | ICD-10-CM

## 2022-10-10 DIAGNOSIS — I480 Paroxysmal atrial fibrillation: Secondary | ICD-10-CM | POA: Diagnosis not present

## 2022-10-10 DIAGNOSIS — E1122 Type 2 diabetes mellitus with diabetic chronic kidney disease: Secondary | ICD-10-CM

## 2022-10-10 DIAGNOSIS — I739 Peripheral vascular disease, unspecified: Secondary | ICD-10-CM

## 2022-10-10 DIAGNOSIS — I110 Hypertensive heart disease with heart failure: Secondary | ICD-10-CM | POA: Diagnosis not present

## 2022-10-10 DIAGNOSIS — I5042 Chronic combined systolic (congestive) and diastolic (congestive) heart failure: Secondary | ICD-10-CM

## 2022-10-10 DIAGNOSIS — R140 Abdominal distension (gaseous): Secondary | ICD-10-CM | POA: Diagnosis not present

## 2022-10-10 DIAGNOSIS — L84 Corns and callosities: Secondary | ICD-10-CM

## 2022-10-10 DIAGNOSIS — N1831 Chronic kidney disease, stage 3a: Secondary | ICD-10-CM

## 2022-10-10 DIAGNOSIS — I129 Hypertensive chronic kidney disease with stage 1 through stage 4 chronic kidney disease, or unspecified chronic kidney disease: Secondary | ICD-10-CM

## 2022-10-10 MED ORDER — LANTUS SOLOSTAR 100 UNIT/ML ~~LOC~~ SOPN: 37.0000 [IU] | PEN_INJECTOR | Freq: Every day | SUBCUTANEOUS | 3 refills | Status: DC

## 2022-10-10 MED ORDER — DULOXETINE HCL 60 MG PO CPEP: 60.0000 mg | ORAL_CAPSULE | Freq: Every day | ORAL | 3 refills | Status: DC

## 2022-10-10 MED ORDER — OZEMPIC (0.25 OR 0.5 MG/DOSE) 2 MG/1.5ML ~~LOC~~ SOPN: 0.5000 mg | PEN_INJECTOR | SUBCUTANEOUS | 6 refills | Status: DC

## 2022-10-10 NOTE — Progress Notes (Signed)
Subjective:  Patient ID: [name], female    DOB: 27-Feb-1961  Age: 61 y.o. MRN: [id number]  CC: Diabetes   HPI [name] is a 61 y.o. year old female with a history of PAD (status post left femoral-popliteal bypass in 2020, status post  LLE bypass drug-coated balloon angioplasty of right SFA secondary to critical right limb ischemia in 2022), carotid stenosis s/p left CEA, HFrEF (EF 55 to 60%, grade 2 DD from echo 02/2021), CAD, paroxysmal A. fib, previous substance abuse, type 2 diabetes mellitus(A1c 9.6), left fifth toe amputation, right foot transmetatarsal amputation, stage III CKD, liver cirrhosis, hypothyroidism, COPD, hypertension, Anxiety and Depression, tobacco abuse.   Interval History:  She had a visit with the heart care Physician Assistant last month with no change in management per notes. She has no dyspnea, chest pain, lightheadedness. Lower extremity Dopplers from 12/2021 revealed patent right SFA.  She denies presence of claudication pain.  Complains of gas in her stomach for the last couple of months and states 'it stinks real bad'. Endorses presence of reflux and she eats late. She is on Protonix.   She needs a letter stating she needs a railing put up for support when she walks as she is unable to walk without support.  She has a callus in her callus in her right foot which she would like removed.  States this lesion has been present for several years and she has an upcoming appointment with podiatry. Sugars are 200-300 random and she endorses adherence with her insulin. States with a higher dose of Trulicity she had GI adverse effects like nausea . She Complains of pins and needles in both feet that wake her up at night and her feet feel cold.  Past Medical History:  Diagnosis Date   Alcohol abuse    Alcoholic cirrhosis ([place])    Anemia    Anxiety    Arthritis    "knees" (11/26/2018)   B12 deficiency    CAD (coronary artery disease)    a. 11/10/2014  Cath: LM nl, LAD min irregs, D1 30 ost, D2 50d, LCX 37m, OM1 80 p/m (1.5 mm vessel), OM2 62m, RCA nondom 3m-->med rx.. Demand ischemia in the setting of rapid a-fib.   Cardiomyopathy ([place])    Carotid artery disease ([place])    a. 01/2015 Carotid Angio: RICA 211, LICA 94R; b. 05/4080 s/p L CEA; c. 05/2019 Carotid U/S: RICA 100. RECA >50. LICA 4-48%.   Cellulitis    lower extremities   CHF (congestive heart failure) ([name])    Chronic combined systolic and diastolic CHF (congestive heart failure) ([place])    a. 10/2014 Echo: EF 40-45%; b. 10/2018 Echo: EF 45-50%, gr2 DD; c. 11/2019 Echo: EF 50%, mild LVH, gr2 DD (restrictive), antlat HK, Nl RV fxn. Mild BAE. RVSP 59.59mmHg.   CKD (chronic kidney disease), stage III ([name])    Cocaine abuse ([name])    COPD (chronic obstructive pulmonary disease) ([name])    Critical lower limb ischemia ([name])    Depression    Diabetes mellitus without complication ([name])    Diabetic peripheral neuropathy ([name])    DVT (deep venous thrombosis) ([name])    Dyspnea    Elevated troponin    a. Chronic elevation.   GERD (gastroesophageal reflux disease)    Hyperlipemia    Hypertension    Hypokalemia    Hypomagnesemia    Hypothyroidism    Marijuana abuse    Narcotic abuse ([place])    Noncompliance  NSVT (nonsustained ventricular tachycardia) ([name])    Obesity    PAF (paroxysmal atrial fibrillation) ([name])    Paroxysmal atrial tachycardia    Peripheral arterial disease ([place])    a. 01/2015 Angio/PTA: RSFA 99 (atherectomy/pta) - 1 vessel runoff via diff dzs peroneal; b. 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   Pneumonia    "once or twice" (11/26/2018)   Poorly controlled type 2 diabetes mellitus ([place])    Renal disorder    Renal insufficiency    a. 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   Sleep apnea    "couldn't handle wearing the mask" (11/26/2018)   Symptomatic bradycardia    a. Avoid AV blocking agent per EP. Prev req temp wire in 2017.   Tobacco abuse     Past  Surgical History:  Procedure Laterality Date   ABDOMINAL AORTOGRAM N/A 06/26/2019   Procedure: ABDOMINAL AORTOGRAM;  Surgeon: Wellington Hampshire, MD;  Location: [facility];  Service: Cardiovascular;  Laterality: N/A;   ABDOMINAL AORTOGRAM W/LOWER EXTREMITY N/A 06/07/2021   Procedure: ABDOMINAL AORTOGRAM W/LOWER EXTREMITY;  Surgeon: [name], MD;  Location: [facility];  Service: Cardiovascular;  Laterality: N/A;   AMPUTATION Right 06/14/2017   Procedure: Right foot transmetatarsal amputation;  Surgeon: Newt Minion, MD;  Location: [facility];  Service: Orthopedics;  Laterality: Right;   AMPUTATION Left 06/28/2019   Procedure: AMPUTATION LEFT FIFTH TOE;  Surgeon: Rosetta Posner, MD;  Location: [facility];  Service: Vascular;  Laterality: Left;   AMPUTATION TOE Right 04/28/2017   Procedure: AMPUTATION OF RIGHT SECOND RAY;  Surgeon: Newt Minion, MD;  Location: [facility];  Service: Orthopedics;  Laterality: Right;   CARDIAC CATHETERIZATION     CARDIAC CATHETERIZATION N/A 01/12/2016   Procedure: Temporary Wire;  Surgeon: Minus Breeding, MD;  Location: [facility];  Service: Cardiovascular;  Laterality: N/A;   CARDIOVERSION  ~ 02/2013   "twice"    CAROTID ANGIOGRAM N/A 01/15/2015   Procedure: CAROTID ANGIOGRAM;  Surgeon: [name], MD;  Location: [facility];  Service: Cardiovascular;  Laterality: N/A;   DILATION AND CURETTAGE OF UTERUS  1988   ENDARTERECTOMY Left 02/19/2015   Procedure: LEFT CAROTID ENDARTERECTOMY ;  Surgeon: Serafina Mitchell, MD;  Location: [facility];  Service: Vascular;  Laterality: Left;   FEMORAL-TIBIAL BYPASS GRAFT Left 06/28/2019   Procedure: BYPASS GRAFT LEFT LEG FEMORAL TO ANTERIOR TIBIAL ARTERY using LEFT GREATER SAPHENOUS VEIN;  Surgeon: Rosetta Posner, MD;  Location: [name];  Service: Vascular;  Laterality: Left;   LEFT HEART CATHETERIZATION WITH CORONARY ANGIOGRAM N/A 10/31/2014   Procedure: LEFT HEART CATHETERIZATION WITH CORONARY ANGIOGRAM;  Surgeon:  Burnell Blanks, MD;  Location: [facility];  Service: Cardiovascular;  Laterality: N/A;   LOWER EXTREMITY ANGIOGRAM N/A 09/10/2013   Procedure: LOWER EXTREMITY ANGIOGRAM;  Surgeon: [name], MD;  Location: [facility] CATH LAB;  Service: Cardiovascular;  Laterality: N/A;   LOWER EXTREMITY ANGIOGRAM N/A 01/15/2015   Procedure: LOWER EXTREMITY ANGIOGRAM;  Surgeon: [name], MD;  Location: [facility] C[facility] LAB;  Service: Cardiovascular;  Laterality: N/A;   LOWER EXTREMITY ANGIOGRAPHY N/A 04/13/2017   Procedure: Lower Extremity Angiography;  Surgeon: [name], MD;  Location: [place] [facility];  Service: Cardiovascular;  Laterality: N/A;   LOWER EXTREMITY ANGIOGRAPHY Left 06/26/2019   Procedure: LOWER EXTREMITY ANGIOGRAPHY;  Surgeon: Wellington Hampshire, MD;  Location: [facility];  Service: Cardiovascular;  Laterality: Left;   PERIPHERAL VASCULAR ATHERECTOMY Right 06/07/2021   Procedure: PERIPHERAL VASCULAR ATHERECTOMY;  Surgeon: [name], MD;  Location: [place] [facility];  Service: Cardiovascular;  Laterality: Right;   PERIPHERAL VASCULAR BALLOON ANGIOPLASTY Left 06/26/2019   Procedure: PERIPHERAL VASCULAR BALLOON ANGIOPLASTY;  Surgeon: Wellington Hampshire, MD;  Location: [place] [facility];  Service: Cardiovascular;  Laterality: Left;  unable to cross lt sfa occlusion   PERIPHERAL VASCULAR INTERVENTION  04/13/2017   Procedure: Peripheral Vascular Intervention;  Surgeon: [name], MD;  Location: [place] [facility];  Service: Cardiovascular;;   PRESSURE SENSOR/CARDIOMEMS N/A 02/05/2020   Procedure: PRESSURE SENSOR/CARDIOMEMS;  Surgeon: Larey Dresser, MD;  Location: [facility];  Service: Cardiovascular;  Laterality: N/A;   VEIN HARVEST Left 06/28/2019   Procedure: LEFT LEG GREATER SAPHENOUS VEIN HARVEST;  Surgeon: Rosetta Posner, MD;  Location: [facility];  Service: Vascular;  Laterality: Left;    Family History  Problem Relation Age of Onset   Hypertension Mother     Diabetes Mother    Cancer Mother        breast, ovarian, colon   Clotting disorder Mother    Heart disease Mother    Heart attack Mother    Breast cancer Mother        in 52's   Hypertension Father    Heart disease Father    Emphysema Sister        smoked    Social History   Socioeconomic History   Marital status: Single    Spouse name: Not on file   Number of children: 1   Years of education: 12   Highest education level: 12th grade  Occupational History   Occupation: disabled  Tobacco Use   Smoking status: Former    Packs/day: 1.00    Years: 44.00    Total pack years: 44.00    Types: E-cigarettes, Cigarettes   Smokeless tobacco: Former    Types: Snuff  Vaping Use   Vaping Use: Former   Devices: 11/26/2018 "stopped months ago"  Substance and Sexual Activity   Alcohol use: Yes    Comment: occ   Drug use: Yes    Types: "Crack" cocaine, Marijuana, Oxycodone   Sexual activity: Not Currently  Other Topics Concern   Not on file  Social History Narrative   ** Merged History Encounter **       Lives in [place], in motel with sister.  They are looking to move but don't have a place to go yet.     Social Determinants of Health   Financial Resource Strain: Low Risk  (09/11/2020)   Overall Financial Resource Strain (CARDIA)    Difficulty of Paying Living Expenses: Not very hard  Food Insecurity: No Food Insecurity (01/28/2020)   Hunger Vital Sign    Worried About Running Out of Food in the Last Year: Never true    Ran Out of Food in the Last Year: Never true  Transportation Needs: No Transportation Needs (01/28/2020)   PRAPARE - Hydrologist (Medical): No    Lack of Transportation (Non-Medical): No  Physical Activity: Insufficiently Active (01/28/2020)   Exercise Vital Sign    Days of Exercise per Week: 1 day    Minutes of Exercise per Session: 10 min  Stress: Not on file  Social Connections: Not on file    Allergies  Allergen  Reactions   Gabapentin Nausea And Vomiting and Other (See Comments)  POSSIBLE SHAKING   Lyrica [Pregabalin] Other (See Comments)    Shaking     Outpatient Medications Prior to Visit  Medication Sig Dispense Refill   ACCU-CHEK GUIDE test strip USE TO CHECK BLOOD SUGAR FOUR TIMES DAILY     acetaminophen (TYLENOL) 500 MG tablet Take 1,000 mg by mouth every 6 (six) hours as needed for headache (pain).     albuterol (VENTOLIN HFA) 108 (90 Base) MCG/ACT inhaler USE 2 PUFFS BY MOUTH EVERY FOUR HOURS, AS NEEDED, FOR COUGHING/WHEEZING 8.5 g 3   allopurinol (ZYLOPRIM) 100 MG tablet TAKE 1 TABLET (100 MG TOTAL) BY MOUTH DAILY(AM) 30 tablet 3   amiodarone (PACERONE) 200 MG tablet TAKE 1/2 TABLET (100 MG TOTAL) BY MOUTH DAILY (AM) 15 tablet 3   amLODipine (NORVASC) 10 MG tablet Take 1 tablet (10 mg total) by mouth every morning. 90 tablet 1   Ascorbic Acid (VITAMIN C PO) Take 1 tablet by mouth every morning.     Aspirin-Salicylamide-Caffeine (BC HEADACHE POWDER PO) Take 1 packet by mouth 4 (four) times daily as needed (pain/headache).     budesonide-formoterol (SYMBICORT) 160-4.5 MCG/ACT inhaler Inhale 2 puffs into the lungs 2 (two) times daily. 1 each 2   buPROPion (WELLBUTRIN SR) 150 MG 12 hr tablet TAKE 1 TABLET BY MOUTH DAILY FOR 3 DAYS THEN INCREASE TO 1 TABLET BY MOUTH 2 TIMES DAILY(AM+BEDTIME) 60 tablet 3   Cholecalciferol (VITAMIN D3 PO) Take 1 capsule by mouth every morning.     colchicine 0.6 MG tablet TAKE 2 TABS (1.2 MG) AT THE ONSET OF A GOUT FLARE, MAY TAKE 1 TAB (0.6 MG) IN 1 HOUR IF SYMPTOMS 30 tablet 3   Continuous Blood Gluc Sensor (FREESTYLE LIBRE 2 SENSOR) MISC USE 1 (ONE) EACH EVERY 2 WEEKS 2 each 11   Dulaglutide (TRULICITY) 4.48 JE/5.6DJ SOPN Inject 0.75 mg into the skin every Friday. 2 mL 3   EASY COMFORT PEN NEEDLES 31G X 5 MM MISC USE FOUR TIMES A DAY FOR INSULIN ADMINISTRATION 100 each 1   ELIQUIS 5 MG TABS tablet TAKE 1 TABLET (5 MG TOTAL) BY MOUTH 2 (TWO) TIMES DAILY  (AM+BEDTIME) 60 tablet 8   empagliflozin (JARDIANCE) 10 MG TABS tablet Take 1 tablet (10 mg total) by mouth daily. TAKE 1 TABLET before breakfast daily 90 tablet 3   FEROSUL 325 (65 Fe) MG tablet TAKE 1 TABLET (324 MG TOTAL) BY MOUTH 2 (TWO) TIMES DAILY AS NEEDED. (AM+BEDTIME) 60 tablet 3   folic acid (FOLVITE) 1 MG tablet Take 1 tablet (1 mg total) by mouth daily. 90 tablet 1   hydrALAZINE (APRESOLINE) 50 MG tablet TAKE 1 & 1/2 TABLETS (75 MG TOTAL) BY MOUTH 3 (THREE) TIMES DAILY (AM+NOON+BEDTIME) 140 tablet 4   icosapent Ethyl (VASCEPA) 1 g capsule Take 2 capsules (2 g total) by mouth 2 (two) times daily. 120 capsule 6   insulin lispro (HUMALOG) 100 UNIT/ML KwikPen Inject 10 Units into the skin 3 (three) times daily with meals. 15 mL 3   levothyroxine (SYNTHROID) 50 MCG tablet TAKE 1 TABLET (50 MCG TOTAL) BY MOUTH DAILY BEFORE BREAKFAST (AM) 30 tablet 3   losartan (COZAAR) 25 MG tablet TAKE 2 TABLETS (50 MG TOTAL) BY MOUTH DAILY (AM) 60 tablet 3   Omega-3 Fatty Acids (FISH OIL PO) Take 1 capsule by mouth every morning.     ondansetron (ZOFRAN) 4 MG tablet Take 1 tablet (4 mg total) by mouth every 8 (eight) hours as needed for nausea or vomiting. 20 tablet  0   pantoprazole (PROTONIX) 40 MG tablet TAKE 1 TABLET (40 MG TOTAL) BY MOUTH DAILY(AM) 30 tablet 3   rosuvastatin (CRESTOR) 40 MG tablet Take 1 tablet (40 mg total) by mouth daily. 90 tablet 3   spironolactone (ALDACTONE) 25 MG tablet Take 0.5 tablets (12.5 mg total) by mouth daily. 45 tablet 1   torsemide (DEMADEX) 20 MG tablet TAKE 100 MG (5 TABLETS) IN THE MORNING AND 80 MG (4 TABLETS) IN THE EVENING.     triamcinolone 0.1% oint-Cerave equivalent lotion 1:1 mixture Apply topically 2 (two) times daily as needed. 454 g 1   FLUoxetine (PROZAC) 20 MG capsule TAKE 1 CAPSULE (20 MG TOTAL) BY MOUTH AT BEDTIME. 30 capsule 3   LANTUS SOLOSTAR 100 UNIT/ML Solostar Pen Inject 34 Units into the skin daily. 15 mL 3   No facility-administered medications  prior to visit.     ROS Review of Systems  Constitutional:  Negative for activity change and appetite change.  HENT:  Negative for sinus pressure and sore throat.   Respiratory:  Negative for chest tightness, shortness of breath and wheezing.   Cardiovascular:  Negative for chest pain and palpitations.  Gastrointestinal:  Negative for abdominal distention, abdominal pain and constipation.  Genitourinary: Negative.   Musculoskeletal: Negative.   Neurological:  Positive for numbness.  Psychiatric/Behavioral:  Negative for behavioral problems and dysphoric mood.     Objective:  BP 129/73   Pulse 83   Ht 5\' 4"  (1.626 m)   LMP  (LMP Unknown)   SpO2 98%   BMI 38.62 kg/m      10/10/2022   11:08 AM 09/07/2022   10:29 AM 07/08/2022    2:20 PM  BP/Weight  Systolic BP 732 202 542  Diastolic BP 73 66 60  Wt. (Lbs)  225 228  BMI  38.62 kg/m2 39.14 kg/m2      Physical Exam Constitutional:      Appearance: She is well-developed.  Cardiovascular:     Rate and Rhythm: Normal rate.     Heart sounds: Normal heart sounds. No murmur heard. Pulmonary:     Effort: Pulmonary effort is normal.     Breath sounds: Normal breath sounds. No wheezing or rales.  Chest:     Chest wall: No tenderness.  Abdominal:     General: Bowel sounds are normal. There is no distension.     Palpations: Abdomen is soft. There is no mass.     Tenderness: There is no abdominal tenderness.  Musculoskeletal:        General: Normal range of motion.     Right lower leg: No edema.     Left lower leg: No edema.     Comments: Right transmetatarsal amputation with ulcer on the medial aspect of stump with blackish eschar, no surrounding inflammation, no discharge, no open areas  Neurological:     Mental Status: She is alert and oriented to person, place, and time.  Psychiatric:        Mood and Affect: Mood normal.        Latest Ref Rng & Units 09/07/2022   11:44 AM 07/07/2022   11:55 AM 05/12/2022   12:15  PM  CMP  Glucose 70 - 99 mg/dL 254  225  270   BUN 6 - 24 mg/dL 26  51  46   Creatinine 0.57 - 1.00 mg/dL 1.35  2.11  1.59   Sodium 134 - 144 mmol/L 140  136  138   Potassium 3.5 -  5.2 mmol/L 3.4  4.7  4.0   Chloride 96 - 106 mmol/L 96  91  98   CO2 20 - 29 mmol/L 28  28  28    Calcium 8.7 - 10.2 mg/dL 8.8  9.3  8.9   Total Protein 6.0 - 8.5 g/dL  6.7    Total Bilirubin 0.0 - 1.2 mg/dL  <0.2    Alkaline Phos 44 - 121 IU/L  138    AST 0 - 40 IU/L  16    ALT 0 - 32 IU/L  14      Lipid Panel     Component Value Date/Time   CHOL 203 (H) 07/08/2022 1454   TRIG 315 (H) 07/08/2022 1454   HDL 47 07/08/2022 1454   CHOLHDL 4.3 07/08/2022 1454   VLDL 63 (H) 07/08/2022 1454   LDLCALC 93 07/08/2022 1454   LDLDIRECT 90.8 04/04/2022 1507    CBC    Component Value Date/Time   WBC 8.6 04/21/2022 0359   RBC 3.58 (L) 04/21/2022 0359   HGB 11.0 (L) 04/21/2022 0359   HGB 10.5 (L) 05/25/2021 1444   HCT 33.8 (L) 04/21/2022 0359   HCT 35.5 05/25/2021 1444   PLT 216 04/21/2022 0359   PLT 368 05/25/2021 1444   MCV 94.4 04/21/2022 0359   MCV 88 05/25/2021 1444   MCH 30.7 04/21/2022 0359   MCHC 32.5 04/21/2022 0359   RDW 13.3 04/21/2022 0359   RDW 14.1 05/25/2021 1444   LYMPHSABS 0.8 04/18/2022 1140   LYMPHSABS 1.6 06/20/2019 1125   MONOABS 1.0 04/18/2022 1140   EOSABS 0.0 04/18/2022 1140   EOSABS 0.3 06/20/2019 1125   BASOSABS 0.0 04/18/2022 1140   BASOSABS 0.0 06/20/2019 1125    Lab Results  Component Value Date   HGBA1C 9.6 (A) 09/13/2022   Lab Results  Component Value Date   TSH 3.109 04/04/2022     Assessment & Plan:  1. DM (diabetes mellitus), type 2 with peripheral vascular complications ([name]) Uncontrolled with A1c of 9.6; goal is less than 7.0 Increased Lantus from 34 units to 37 units Unfortunately due to GI adverse effects we are unable to maximize her GLP-1 RA therapy.  Will substitute Trulicity with Ozempic due to additional weight loss benefits with the  latter Counseled on Diabetic diet, my plate method, 287 minutes of moderate intensity exercise/week Blood sugar logs with fasting goals of 80-120 mg/dl, random of less than 180 and in the event of sugars less than 60 mg/dl or greater than 400 mg/dl encouraged to notify the clinic. Advised on the need for annual eye exams, annual foot exams, Pneumonia vaccine. - Semaglutide,0.25 or 0.5MG /DOS, (OZEMPIC, 0.25 OR 0.5 MG/DOSE,) 2 MG/1.5ML SOPN; Inject 0.5 mg into the skin once a week.  Dispense: 2 mL; Refill: 6 - DULoxetine (CYMBALTA) 60 MG capsule; Take 1 capsule (60 mg total) by mouth daily. For diabetic neuropathy  Dispense: 30 capsule; Refill: 3 - LANTUS SOLOSTAR 100 UNIT/ML Solostar Pen; Inject 37 Units into the skin daily.  Dispense: 15 mL; Refill: 3  2. Pre-ulcerative calluses She does need debridement She has an upcoming appointment with podiatry and has been advised to keep this  3. Hypothyroidism, unspecified type Last panel was normal in 03/2022 Will check labs again and adjust regimen accordingly - T4, free - TSH - T3  4. Gassiness Currently on a PPI with no improvement in symptoms Advised to hold off on Protonix for 2 weeks after which we will check H. pylori - H. pylori  breath test; Future  5. Screening for viral disease - HCV Ab w Reflex to Quant PCR  6. Screening for colon cancer - Ambulatory referral to Gastroenterology  7. Hypertensive heart disease with chronic combined systolic and diastolic congestive heart failure ([name]) EF of 55 to 60% from echo 02/2021 She is euvolemic Continue SGLT2i, ARB, spironolactone Follow-up with cardiology  8. Hypertension associated with stage 3a chronic kidney disease due to type 2 diabetes mellitus ([place]) Controlled Continue antihypertensive Avoid Nephrotoxins Counseled on blood pressure goal of less than 130/80, low-sodium, DASH diet, medication compliance, 150 minutes of moderate intensity exercise per week. Discussed medication  compliance, adverse effects.  9. PAF (paroxysmal atrial fibrillation) ([name]) Currently in sinus rhythm On rate control with Amiodarone and anticoagulation with Eliquis  10. Peripheral arterial disease ([name]) status post left femoral-popliteal bypass in 2020, status post  LLE bypass drug-coated balloon angioplasty of right SFA secondary to critical right limb ischemia in 2022 Risk factor modification  11. Need for immunization against influenza - Flu Vaccine QUAD 45mo+IM (Fluarix, Fluzone & Alfiuria Quad PF)   Health Care Maintenance: will address at next visit Meds ordered this encounter  Medications   Semaglutide,0.25 or 0.5MG /DOS, (OZEMPIC, 0.25 OR 0.5 MG/DOSE,) 2 MG/1.5ML SOPN    Sig: Inject 0.5 mg into the skin once a week.    Dispense:  2 mL    Refill:  6    Discontinue Trulicity   DULoxetine (CYMBALTA) 60 MG capsule    Sig: Take 1 capsule (60 mg total) by mouth daily. For diabetic neuropathy    Dispense:  30 capsule    Refill:  3    Discontinue Prozac   LANTUS SOLOSTAR 100 UNIT/ML Solostar Pen    Sig: Inject 37 Units into the skin daily.    Dispense:  15 mL    Refill:  3    Dose increase    Follow-up: Return in about 6 weeks (around 11/21/2022) for Medicare wellness exam.       Charlott Rakes, MD, FAAFP. [facility] and [place], [place]   10/10/2022, 12:19 PM

## 2022-10-10 NOTE — Telephone Encounter (Signed)
Unable to refill per protocol, Rx expired.medication was discontinued 10/10/22. Will refuse.  Requested Prescriptions  Pending Prescriptions Disp Refills   FLUoxetine (PROZAC) 20 MG capsule [Pharmacy Med Name: FLUOXETINE HCL 20 MG ORAL CAPSULE] 30 capsule 3    Sig: TAKE 1 CAPSULE (20 MG TOTAL) BY MOUTH AT BEDTIME.     Psychiatry:  Antidepressants - SSRI Passed - 10/07/2022 12:51 PM      Passed - Completed PHQ-2 or PHQ-9 in the last 360 days      Passed - Valid encounter within last 6 months    Recent Outpatient Visits           Today Pre-ulcerative calluses   [place], [place], MD   3 weeks ago DM (diabetes mellitus), type 2 with peripheral vascular complications [facility])   [place], Annie Main L, RPH-CPP   3 months ago DM (diabetes mellitus), type 2 with peripheral vascular complications [facility])   [place], Levada Dy M, [place]   10 months ago DM (diabetes mellitus), type 2 with peripheral vascular complications [facility])   [place], Charlane Ferretti, MD   1 year ago DM (diabetes mellitus), type 2 with peripheral vascular complications [facility])   [place], Dionne Bucy, [place]       Future Appointments             In 1 month Charlott Rakes, MD [place] 325 (65 Fe) MG tablet [Pharmacy Med Name: FEROSUL 325 (65 FE) MG ORAL TABLET] 60 tablet 3    Sig: TAKE 1 TABLET (324 MG TOTAL) BY MOUTH 2 (TWO) TIMES DAILY AS NEEDED. (AM+BEDTIME)     Endocrinology:  Minerals - Iron Supplementation Failed - 10/07/2022 12:51 PM      Failed - HGB in normal range and within 360 days    Hemoglobin  Date Value Ref Range Status  04/21/2022 11.0 (L) 12.0 - 15.0 g/dL Final  05/25/2021 10.5 (L) 11.1 - 15.9 g/dL Final   Total hemoglobin  Date Value Ref Range Status   12/03/2015 9.6 (L) 12.0 - 16.0 g/dL Final         Failed - HCT in normal range and within 360 days    HCT  Date Value Ref Range Status  04/21/2022 33.8 (L) 36.0 - 46.0 % Final   Hematocrit  Date Value Ref Range Status  05/25/2021 35.5 34.0 - 46.6 % Final         Failed - RBC in normal range and within 360 days    RBC  Date Value Ref Range Status  04/21/2022 3.58 (L) 3.87 - 5.11 MIL/uL Final         Failed - Fe (serum) in normal range and within 360 days    Iron  Date Value Ref Range Status  11/29/2018 38 28 - 170 ug/dL Final   Saturation Ratios  Date Value Ref Range Status  11/29/2018 9 (L) 10.4 - 31.8 % Final         Failed - Ferritin in normal range and within 360 days    Ferritin  Date Value Ref Range Status  11/29/2018 61 11 - 307 ng/mL Final    Comment:    Performed at [facility], [place] [address], [place], [place] [address]  Passed - Valid encounter within last 12 months    Recent Outpatient Visits           Today Pre-ulcerative calluses   [place], [place], MD   3 weeks ago DM (diabetes mellitus), type 2 with peripheral vascular complications [facility])   [place], Jarome Matin, RPH-CPP   3 months ago DM (diabetes mellitus), type 2 with peripheral vascular complications [facility])   [place], Levada Dy M, [place]   10 months ago DM (diabetes mellitus), type 2 with peripheral vascular complications [facility])   [place], Charlane Ferretti, MD   1 year ago DM (diabetes mellitus), type 2 with peripheral vascular complications [facility])   [place], Dionne Bucy, [place]       Future Appointments             In 1 month Charlott Rakes, MD [place]

## 2022-10-10 NOTE — Progress Notes (Signed)
Needs letter for apartment complex for railing. Not sleeping

## 2022-10-10 NOTE — Telephone Encounter (Signed)
Requested Prescriptions  Pending Prescriptions Disp Refills   allopurinol (ZYLOPRIM) 100 MG tablet [Pharmacy Med Name: ALLOPURINOL 100 MG ORAL TABLET] 90 tablet 0    Sig: TAKE 1 TABLET (100 MG TOTAL) BY MOUTH DAILY(AM)     Endocrinology:  Gout Agents - allopurinol Failed - 10/07/2022 12:51 PM      Failed - Uric Acid in normal range and within 360 days    Uric Acid  Date Value Ref Range Status  08/11/2021 10.3 (H) 3.0 - 7.2 mg/dL Final    Comment:               Therapeutic target for gout patients: <6.0         Failed - Cr in normal range and within 360 days    Creat  Date Value Ref Range Status  01/25/2016 1.09 (H) 0.50 - 1.05 mg/dL Final   Creatinine, Ser  Date Value Ref Range Status  09/07/2022 1.35 (H) 0.57 - 1.00 mg/dL Final   Creatinine, Urine  Date Value Ref Range Status  08/18/2018 72.60 mg/dL Final    Comment:    Performed at [facility], [place] [address] [place], [place] [address]         Passed - Valid encounter within last 12 months    Recent Outpatient Visits           Today Pre-ulcerative calluses   [place], [place], MD   3 weeks ago DM (diabetes mellitus), type 2 with peripheral vascular complications [facility])   [place], Jarome Matin, RPH-CPP   3 months ago DM (diabetes mellitus), type 2 with peripheral vascular complications [facility])   [place], Levada Dy M, [place]   10 months ago DM (diabetes mellitus), type 2 with peripheral vascular complications ([place])   [place], Charlane Ferretti, MD   1 year ago DM (diabetes mellitus), type 2 with peripheral vascular complications [facility])   [place], Dionne Bucy, [place]       Future Appointments             In 1 month Charlott Rakes, MD [place] - CBC within normal limits  and completed in the last 12 months    WBC  Date Value Ref Range Status  04/21/2022 8.6 4.0 - 10.5 K/uL Final   RBC  Date Value Ref Range Status  04/21/2022 3.58 (L) 3.87 - 5.11 MIL/uL Final   Hemoglobin  Date Value Ref Range Status  04/21/2022 11.0 (L) 12.0 - 15.0 g/dL Final  05/25/2021 10.5 (L) 11.1 - 15.9 g/dL Final   Total hemoglobin  Date Value Ref Range Status  12/03/2015 9.6 (L) 12.0 - 16.0 g/dL Final   HCT  Date Value Ref Range Status  04/21/2022 33.8 (L) 36.0 - 46.0 % Final   Hematocrit  Date Value Ref Range Status  05/25/2021 35.5 34.0 - 46.6 % Final   MCHC  Date Value Ref Range Status  04/21/2022 32.5 30.0 - 36.0 g/dL Final   [facility]  Date Value Ref Range Status  04/21/2022 30.7 26.0 - 34.0 pg Final   MCV  Date Value Ref Range Status  04/21/2022 94.4 80.0 - 100.0 fL Final  05/25/2021 88 79 - 97 fL Final   No results found for: "PLTCOUNTKUC", "LABPLAT", "POCPLA"  RDW  Date Value Ref Range Status  04/21/2022 13.3 11.5 - 15.5 % Final  05/25/2021 14.1 11.7 - 15.4 % Final          losartan (COZAAR) 25 MG tablet [Pharmacy Med Name: LOSARTAN POTASSIUM 25 MG ORAL TABLET] 180 tablet 0    Sig: TAKE 2 TABLETS (50 MG TOTAL) BY MOUTH DAILY (AM)     Cardiovascular:  Angiotensin Receptor Blockers Failed - 10/07/2022 12:51 PM      Failed - Cr in normal range and within 180 days    Creat  Date Value Ref Range Status  01/25/2016 1.09 (H) 0.50 - 1.05 mg/dL Final   Creatinine, Ser  Date Value Ref Range Status  09/07/2022 1.35 (H) 0.57 - 1.00 mg/dL Final   Creatinine, Urine  Date Value Ref Range Status  08/18/2018 72.60 mg/dL Final    Comment:    Performed at [facility], [place] [address] [place], [place] [address]         Failed - K in normal range and within 180 days    Potassium  Date Value Ref Range Status  09/07/2022 3.4 (L) 3.5 - 5.2 mmol/L Final         Passed - Patient is not pregnant      Passed - Last BP in normal range    BP Readings  from Last 1 Encounters:  10/10/22 129/73         Passed - Valid encounter within last 6 months    Recent Outpatient Visits           Today Pre-ulcerative calluses   [place], [place], MD   3 weeks ago DM (diabetes mellitus), type 2 with peripheral vascular complications [facility])   [place], Annie Main L, RPH-CPP   3 months ago DM (diabetes mellitus), type 2 with peripheral vascular complications [facility])   [place], Levada Dy M, [place]   10 months ago DM (diabetes mellitus), type 2 with peripheral vascular complications [facility])   [place], Charlane Ferretti, MD   1 year ago DM (diabetes mellitus), type 2 with peripheral vascular complications [facility])   [place], Dionne Bucy, [place]       Future Appointments             In 1 month Charlott Rakes, MD [place]             pantoprazole (PROTONIX) 40 MG tablet [Pharmacy Med Name: PANTOPRAZOLE SODIUM 40 MG ORAL TABLET DELAYED RELEASE] 90 tablet 0    Sig: TAKE 1 TABLET (40 MG TOTAL) BY MOUTH DAILY(AM)     Gastroenterology: Proton Pump Inhibitors Passed - 10/07/2022 12:51 PM      Passed - Valid encounter within last 12 months    Recent Outpatient Visits           Today Pre-ulcerative calluses   [place], [place], MD   3 weeks ago DM (diabetes mellitus), type 2 with peripheral vascular complications [facility])   [place], Annie Main L, RPH-CPP   3 months ago DM (diabetes mellitus), type 2 with peripheral vascular complications [facility])   [place], Levada Dy M, [place]   10 months ago DM (diabetes mellitus), type 2 with peripheral vascular complications ([place])   [place], [place],  MD   1 year ago DM (diabetes mellitus), type 2 with peripheral vascular complications [facility])   [place], Dionne Bucy, [place]       Future Appointments             In 1 month Charlott Rakes, MD [place]

## 2022-10-10 NOTE — Patient Instructions (Signed)
Diabetes Mellitus and Foot Care Diabetes, also called diabetes mellitus, may cause problems with your feet and legs because of poor blood flow (circulation). Poor circulation may make your skin: Become thinner and drier. Break more easily. Heal more slowly. Peel and crack. You may also have nerve damage (neuropathy). This can cause decreased feeling in your legs and feet. This means that you may not notice minor injuries to your feet that could lead to more serious problems. Finding and treating problems early is the best way to prevent future foot problems. How to care for your feet Foot hygiene  Wash your feet daily with warm water and mild soap. Do not use hot water. Then, pat your feet and the areas between your toes until they are fully dry. Do not soak your feet. This can dry your skin. Trim your toenails straight across. Do not dig under them or around the cuticle. File the edges of your nails with an emery board or nail file. Apply a moisturizing lotion or petroleum jelly to the skin on your feet and to dry, brittle toenails. Use lotion that does not contain alcohol and is unscented. Do not apply lotion between your toes. Shoes and socks Wear clean socks or stockings every day. Make sure they are not too tight. Do not wear knee-high stockings. These may decrease blood flow to your legs. Wear shoes that fit well and have enough cushioning. Always look in your shoes before you put them on to be sure there are no objects inside. To break in new shoes, wear them for just a few hours a day. This prevents injuries on your feet. Wounds, scrapes, corns, and calluses  Check your feet daily for blisters, cuts, bruises, sores, and redness. If you cannot see the bottom of your feet, use a mirror or ask someone for help. Do not cut off corns or calluses or try to remove them with medicine. If you find a minor scrape, cut, or break in the skin on your feet, keep it and the skin around it clean and  dry. You may clean these areas with mild soap and water. Do not clean the area with peroxide, alcohol, or iodine. If you have a wound, scrape, corn, or callus on your foot, look at it several times a day to make sure it is healing and not infected. Check for: Redness, swelling, or pain. Fluid or blood. Warmth. Pus or a bad smell. General tips Do not cross your legs. This may decrease blood flow to your feet. Do not use heating pads or hot water bottles on your feet. They may burn your skin. If you have lost feeling in your feet or legs, you may not know this is happening until it is too late. Protect your feet from hot and cold by wearing shoes, such as at the beach or on hot pavement. Schedule a complete foot exam at least once a year or more often if you have foot problems. Report any cuts, sores, or bruises to your health care provider right away. Where to find more information American Diabetes Association: diabetes.org Association of Diabetes Care & Education Specialists: diabeteseducator.org Contact a health care provider if: You have a condition that increases your risk of infection, and you have any cuts, sores, or bruises on your feet. You have an injury that is not healing. You have redness on your legs or feet. You feel burning or tingling in your legs or feet. You have pain or cramps in your legs   and feet. Your legs or feet are numb. Your feet always feel cold. You have pain around any toenails. Get help right away if: You have a wound, scrape, corn, or callus on your foot and: You have signs of infection. You have a fever. You have a red line going up your leg. This information is not intended to replace advice given to you by your health care provider. Make sure you discuss any questions you have with your health care provider. Document Revised: 05/04/2022 Document Reviewed: 05/04/2022 Elsevier Patient Education  2023 Elsevier Inc.  

## 2022-10-11 ENCOUNTER — Other Ambulatory Visit: Payer: Self-pay | Admitting: Family Medicine

## 2022-10-11 DIAGNOSIS — E039 Hypothyroidism, unspecified: Secondary | ICD-10-CM

## 2022-10-11 LAB — TSH: TSH: 3.29 u[IU]/mL (ref 0.450–4.500)

## 2022-10-11 LAB — HCV AB W REFLEX TO QUANT PCR: HCV Ab: NONREACTIVE

## 2022-10-11 LAB — T3: T3, Total: 79 ng/dL (ref 71–180)

## 2022-10-11 LAB — HCV INTERPRETATION

## 2022-10-11 LAB — T4, FREE: Free T4: 1.77 ng/dL (ref 0.82–1.77)

## 2022-10-11 MED ORDER — LEVOTHYROXINE SODIUM 50 MCG PO TABS: ORAL_TABLET | ORAL | 1 refills | Status: DC

## 2022-10-14 ENCOUNTER — Encounter (HOSPITAL_COMMUNITY): Payer: Medicare Other | Admitting: Cardiology

## 2022-11-01 ENCOUNTER — Ambulatory Visit (INDEPENDENT_AMBULATORY_CARE_PROVIDER_SITE_OTHER): Payer: Medicare Other | Admitting: Podiatry

## 2022-11-01 ENCOUNTER — Encounter: Payer: Self-pay | Admitting: Podiatry

## 2022-11-01 DIAGNOSIS — B351 Tinea unguium: Secondary | ICD-10-CM

## 2022-11-01 DIAGNOSIS — Z89431 Acquired absence of right foot: Secondary | ICD-10-CM | POA: Diagnosis not present

## 2022-11-01 DIAGNOSIS — M79676 Pain in unspecified toe(s): Secondary | ICD-10-CM

## 2022-11-01 DIAGNOSIS — L84 Corns and callosities: Secondary | ICD-10-CM | POA: Diagnosis not present

## 2022-11-01 DIAGNOSIS — E1151 Type 2 diabetes mellitus with diabetic peripheral angiopathy without gangrene: Secondary | ICD-10-CM | POA: Diagnosis not present

## 2022-11-01 DIAGNOSIS — D689 Coagulation defect, unspecified: Secondary | ICD-10-CM

## 2022-11-01 DIAGNOSIS — I739 Peripheral vascular disease, unspecified: Secondary | ICD-10-CM | POA: Diagnosis not present

## 2022-11-01 DIAGNOSIS — N179 Acute kidney failure, unspecified: Secondary | ICD-10-CM

## 2022-11-01 NOTE — Progress Notes (Signed)
This patient returns to my office for at risk foot care.  This patient requires this care by a professional since this patient will be at risk due to having  PAD, coagulation defect, and chronic kidney disease.  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  Patient is taking plavix.  This patient is unable to cut nails herself since the patient cannot reach her nails.These nails on her left foot  are painful walking and wearing shoes.   She also has a bloody callus  noted along  the amputation site  right foot. This patient presents for at risk foot care today.  General Appearance  Alert, conversant and in no acute stress.  Vascular  Dorsalis pedis and posterior tibial  pulses are not  palpable  left foot.  Capillary return is diminished.  Cold feet    Neurologic  Senn-Weinstein monofilament wire test diminished.  bilaterally. Muscle power within normal limits bilaterally.  Nails Thick disfigured discolored nails with subungual debris  1-4 left foot.  . No evidence of bacterial infection or drainage bilaterally.  Orthopedic  No limitations of motion  feet .  No crepitus or effusions noted.  No bony pathology or digital deformities noted. TMA right foot.  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  No signs of infections or ulcers noted.    Ulcer/ hemorrhagic callus  noted distal first metatarsal head right foot.  No evidence of redness or infection noted.   Onychomycosis    Pain in left toes  Callus/hemorrhagic callus right foot.  Consent was obtained for treatment procedures.   Mechanical debridement of nails 1-4  Left foot. performed with a nail nipper.  Filed with dremel without incident. Debridement of callus distal 1st metatarsal right foot.  After debridement with # 15 blade the site was smoothed with dremel tool.  Padding added to her insole in her diabetic shoe. She is to make an appointment with pedorthist next year for  new diabetic shoes.   Return office visit   10 weeks                     Told patient to return for periodic foot care and evaluation due to potential at risk complications.   Gardiner Barefoot DPM

## 2022-11-03 ENCOUNTER — Other Ambulatory Visit: Payer: Self-pay | Admitting: Family Medicine

## 2022-11-03 ENCOUNTER — Other Ambulatory Visit (HOSPITAL_COMMUNITY): Payer: Self-pay | Admitting: Family Medicine

## 2022-11-03 ENCOUNTER — Other Ambulatory Visit (HOSPITAL_COMMUNITY): Payer: Self-pay | Admitting: Cardiology

## 2022-11-03 DIAGNOSIS — I110 Hypertensive heart disease with heart failure: Secondary | ICD-10-CM

## 2022-11-09 ENCOUNTER — Telehealth (HOSPITAL_COMMUNITY): Payer: Self-pay

## 2022-11-09 ENCOUNTER — Telehealth (HOSPITAL_COMMUNITY): Payer: Self-pay | Admitting: Adult Health

## 2022-11-09 NOTE — Telephone Encounter (Signed)
  Pt aware, agreeable, and verbalized understanding   She states she has enough medication to cover her and does not need any refills     Cardiomems Remote Monitoring   S/P Cardiomems Implant    PAD Goal: 20 Most recent reading: 24   Recommended changes: Please call and instructed to take torsemide 100 mg twice a day x 3 days then drop back to torsemide 100 mg in am and 80 mg in pm.    I continue to review and analyze the patients PA pressures weekly (and more often as needed) to bring PA pressures within the optimal range.         [name] Clegg NP-C  8:42 AM

## 2022-11-09 NOTE — Telephone Encounter (Signed)
  Cardiomems Remote Monitoring  S/P Cardiomems Implant   PAD Goal: 20 Most recent reading: 24  Recommended changes: Please call and instructed to take torsemide 100 mg twice a day x 3 days then drop back to torsemide 100 mg in am and 80 mg in pm.   I continue to review and analyze the patients PA pressures weekly (and more often as needed) to bring PA pressures within the optimal range.      Charlie Seda NP-C  8:42 AM

## 2022-11-22 ENCOUNTER — Ambulatory Visit: Payer: Medicare Other | Admitting: Family Medicine

## 2022-11-24 ENCOUNTER — Emergency Department (HOSPITAL_COMMUNITY)
Admission: EM | Admit: 2022-11-24 | Discharge: 2022-11-24 | Disposition: A | Discharge status: Home / Self Care | Payer: Medicare Other | Attending: Emergency Medicine | Admitting: Emergency Medicine

## 2022-11-24 ENCOUNTER — Other Ambulatory Visit: Payer: Self-pay

## 2022-11-24 ENCOUNTER — Emergency Department (HOSPITAL_COMMUNITY): Payer: Medicare Other

## 2022-11-24 DIAGNOSIS — W1830XA Fall on same level, unspecified, initial encounter: Secondary | ICD-10-CM | POA: Insufficient documentation

## 2022-11-24 DIAGNOSIS — S79912A Unspecified injury of left hip, initial encounter: Secondary | ICD-10-CM | POA: Diagnosis not present

## 2022-11-24 DIAGNOSIS — S8992XA Unspecified injury of left lower leg, initial encounter: Secondary | ICD-10-CM | POA: Diagnosis not present

## 2022-11-24 DIAGNOSIS — J449 Chronic obstructive pulmonary disease, unspecified: Secondary | ICD-10-CM | POA: Insufficient documentation

## 2022-11-24 DIAGNOSIS — Z7982 Long term (current) use of aspirin: Secondary | ICD-10-CM | POA: Diagnosis not present

## 2022-11-24 DIAGNOSIS — Y92019 Unspecified place in single-family (private) house as the place of occurrence of the external cause: Secondary | ICD-10-CM | POA: Diagnosis not present

## 2022-11-24 DIAGNOSIS — I110 Hypertensive heart disease with heart failure: Secondary | ICD-10-CM | POA: Insufficient documentation

## 2022-11-24 DIAGNOSIS — S299XXA Unspecified injury of thorax, initial encounter: Secondary | ICD-10-CM | POA: Diagnosis not present

## 2022-11-24 DIAGNOSIS — I509 Heart failure, unspecified: Secondary | ICD-10-CM | POA: Diagnosis not present

## 2022-11-24 DIAGNOSIS — M25552 Pain in left hip: Secondary | ICD-10-CM | POA: Diagnosis not present

## 2022-11-24 DIAGNOSIS — S20212A Contusion of left front wall of thorax, initial encounter: Secondary | ICD-10-CM | POA: Diagnosis not present

## 2022-11-24 DIAGNOSIS — M545 Low back pain, unspecified: Secondary | ICD-10-CM | POA: Diagnosis present

## 2022-11-24 DIAGNOSIS — S0990XA Unspecified injury of head, initial encounter: Secondary | ICD-10-CM | POA: Diagnosis not present

## 2022-11-24 DIAGNOSIS — I959 Hypotension, unspecified: Secondary | ICD-10-CM | POA: Diagnosis not present

## 2022-11-24 DIAGNOSIS — S0083XA Contusion of other part of head, initial encounter: Secondary | ICD-10-CM

## 2022-11-24 DIAGNOSIS — S80919A Unspecified superficial injury of unspecified knee, initial encounter: Secondary | ICD-10-CM | POA: Diagnosis not present

## 2022-11-24 DIAGNOSIS — J984 Other disorders of lung: Secondary | ICD-10-CM | POA: Diagnosis not present

## 2022-11-24 DIAGNOSIS — W19XXXA Unspecified fall, initial encounter: Secondary | ICD-10-CM | POA: Diagnosis not present

## 2022-11-24 DIAGNOSIS — Z8673 Personal history of transient ischemic attack (TIA), and cerebral infarction without residual deficits: Secondary | ICD-10-CM | POA: Diagnosis not present

## 2022-11-24 DIAGNOSIS — Z794 Long term (current) use of insulin: Secondary | ICD-10-CM | POA: Diagnosis not present

## 2022-11-24 DIAGNOSIS — M25462 Effusion, left knee: Secondary | ICD-10-CM | POA: Diagnosis not present

## 2022-11-24 DIAGNOSIS — M1712 Unilateral primary osteoarthritis, left knee: Secondary | ICD-10-CM | POA: Diagnosis not present

## 2022-11-24 DIAGNOSIS — Z7901 Long term (current) use of anticoagulants: Secondary | ICD-10-CM | POA: Diagnosis not present

## 2022-11-24 DIAGNOSIS — Z79899 Other long term (current) drug therapy: Secondary | ICD-10-CM | POA: Insufficient documentation

## 2022-11-24 DIAGNOSIS — D689 Coagulation defect, unspecified: Secondary | ICD-10-CM | POA: Diagnosis not present

## 2022-11-24 MED ORDER — LIDOCAINE 5 % EX PTCH: 1.0000 | MEDICATED_PATCH | CUTANEOUS | 0 refills | Status: DC

## 2022-11-24 MED ORDER — LIDOCAINE 5 % EX PTCH
1.0000 | MEDICATED_PATCH | CUTANEOUS | Status: DC
  Administered 2022-11-24: 1 via TRANSDERMAL
  Filled 2022-11-24: qty 1

## 2022-11-24 MED ORDER — ACETAMINOPHEN 325 MG PO TABS
650.0000 mg | ORAL_TABLET | Freq: Once | ORAL | Status: AC
Start: 2022-11-24 — End: 2022-11-24
  Administered 2022-11-24: 650 mg via ORAL
  Filled 2022-11-24: qty 2

## 2022-11-24 NOTE — ED Triage Notes (Signed)
Pt BIB [facility] from home due to falling three to four days ago.  Pt has bruising to left side of face and left leg.  Pt reports leg leg hurts when moving it.  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  Pt reports no LOC when she fell a couple days ago.  VS BP 138/58, HR 86, SpO2 97% CBG 148.

## 2022-11-24 NOTE — Discharge Instructions (Signed)
You were seen in the emergency department after your fall.  Your CAT scan showed no injury or bleeding in your brain and your x-ray showed no obvious broken bones.  There is a possible small fracture in your hip versus an artifact and if you are having continued hip pain you should have repeat x-rays done by your primary doctor.  You can continue to take Tylenol as needed for pain up to every 6 hours as well as use lidocaine patches, ice or heat.  You should make sure that you are wearing shoes within the home to help prevent slipping and falling as well as your walker.  You should follow-up with your primary doctor to have your symptoms rechecked and to determine if you may benefit from physical therapy.  You should return to the emergency department if you are unable to walk, you have significantly worsening headaches, you have repetitive vomiting or if you have any other new or concerning symptoms.

## 2022-11-24 NOTE — ED Provider Notes (Signed)
[facility] EMERGENCY DEPARTMENT Provider Note   CSN: [id number] Arrival date & time: 11/24/22  1434     History  Chief Complaint  Patient presents with   [name] is a 62 y.o. female.  Patient is a 62 year old female with a past medical history of hypertension, CHF, DVT on Eliquis, COPD presenting to the emergency department after a fall.  Patient states that over the last week she has had several falls in her home and she states that she recently moved into a new apartment that does not have any carpeting.  She states that she normally uses a walker to get around.  She denies any chest pain, shortness of breath or lightheadedness prior to the falls.  She states that her last fall was several days ago however when she woke up this morning she had significant worsening of left hip pain which prompted her to come to the emergency department.  She states that she has hit her head on a least 2 of the falls.  She states that she has had nausea and intermittent vomiting.  She denies any numbness or weakness in her arms or legs.  She states that she is also having pain on her left side, left low back, left hip and left knee.  She states that she has been taking Tylenol for pain with only some relief.  The history is provided by the patient.  Fall       Home Medications Prior to Admission medications   Medication Sig Start Date End Date Taking? Authorizing Provider  lidocaine (LIDODERM) 5 % Place 1 patch onto the skin daily. Remove & Discard patch within 12 hours or as directed by MD 11/24/22  Yes Maylon Peppers, Norman Herrlich, DO  ACCU-CHEK GUIDE test strip USE TO CHECK BLOOD SUGAR FOUR TIMES DAILY 02/25/20   [provider]  acetaminophen (TYLENOL) 500 MG tablet Take 1,000 mg by mouth every 6 (six) hours as needed for headache (pain).    [provider]  albuterol (VENTOLIN HFA) 108 (90 Base) MCG/ACT inhaler USE 2 PUFFS BY MOUTH EVERY FOUR HOURS, AS  NEEDED, FOR COUGHING/WHEEZING 07/12/22   Charlott Rakes, MD  allopurinol (ZYLOPRIM) 100 MG tablet TAKE 1 TABLET (100 MG TOTAL) BY MOUTH DAILY(AM) 10/10/22   Charlott Rakes, MD  amiodarone (PACERONE) 200 MG tablet TAKE 1/2 TABLET (100 MG TOTAL) BY MOUTH DAILY (AM) 08/08/22   Larey Dresser, MD  amLODipine (NORVASC) 10 MG tablet Take 1 tablet (10 mg total) by mouth every morning. 07/07/22   Argentina Donovan, PA-C  Ascorbic Acid (VITAMIN C PO) Take 1 tablet by mouth every morning.    [provider]  Aspirin-Salicylamide-Caffeine (BC HEADACHE POWDER PO) Take 1 packet by mouth 4 (four) times daily as needed (pain/headache).    [provider]  budesonide-formoterol (SYMBICORT) 160-4.5 MCG/ACT inhaler Inhale 2 puffs into the lungs 2 (two) times daily. 07/07/22   Argentina Donovan, PA-C  buPROPion (WELLBUTRIN SR) 150 MG 12 hr tablet TAKE 1 TABLET BY MOUTH DAILY FOR 3 DAYS THEN INCREASE TO 1 TABLET BY MOUTH 2 TIMES DAILY(AM+BEDTIME) 08/08/22   Larey Dresser, MD  Cholecalciferol (VITAMIN D3 PO) Take 1 capsule by mouth every morning.    [provider]  colchicine 0.6 MG tablet TAKE 2 TABS (1.2 MG) AT THE ONSET OF A GOUT FLARE, MAY TAKE 1 TAB (0.6 MG) IN 1 HOUR IF SYMPTOMS 08/17/22   Charlott Rakes, MD  Continuous  Blood Gluc Sensor (FREESTYLE LIBRE 2 SENSOR) MISC USE 1 (ONE) EACH EVERY 2 WEEKS 07/14/21   Charlott Rakes, MD  Dulaglutide (TRULICITY) 2.50 IB/7.0WU SOPN Inject 0.75 mg into the skin every Friday. 07/08/22   Argentina Donovan, PA-C  DULoxetine (CYMBALTA) 60 MG capsule Take 1 capsule (60 mg total) by mouth daily. For diabetic neuropathy 10/10/22   Charlott Rakes, MD  EASY COMFORT PEN NEEDLES 31G X 5 MM MISC USE FOUR TIMES A DAY FOR INSULIN ADMINISTRATION 08/30/22   McClung, Angela M, PA-C  ELIQUIS 5 MG TABS tablet TAKE 1 TABLET (5 MG TOTAL) BY MOUTH 2 (TWO) TIMES DAILY (AM+BEDTIME) 08/08/22   Larey Dresser, MD  empagliflozin (JARDIANCE) 10 MG TABS tablet Take 1 tablet  (10 mg total) by mouth daily. TAKE 1 TABLET before breakfast daily 07/07/22   Argentina Donovan, PA-C  FEROSUL 325 (65 Fe) MG tablet TAKE 1 TABLET (324 MG TOTAL) BY MOUTH 2 (TWO) TIMES DAILY AS NEEDED. (AM+BEDTIME) 11/04/22   Charlott Rakes, MD  folic acid (FOLVITE) 1 MG tablet Take 1 tablet (1 mg total) by mouth daily. 07/07/22   Argentina Donovan, PA-C  hydrALAZINE (APRESOLINE) 50 MG tablet TAKE 1 & 1/2 TABLETS (75 MG TOTAL) BY MOUTH 3 (THREE) TIMES DAILY (AM+NOON+BEDTIME) 11/03/22   [place], Maricela Bo, FNP  icosapent Ethyl (VASCEPA) 1 g capsule Take 2 capsules (2 g total) by mouth 2 (two) times daily. 11/04/22   Lyda Jester M, PA-C  insulin lispro (HUMALOG) 100 UNIT/ML KwikPen Inject 10 Units into the skin 3 (three) times daily with meals. 09/13/22   Charlott Rakes, MD  LANTUS SOLOSTAR 100 UNIT/ML Solostar Pen Inject 37 Units into the skin daily. 10/10/22   Charlott Rakes, MD  levothyroxine (SYNTHROID) 50 MCG tablet TAKE 1 TABLET (50 MCG TOTAL) BY MOUTH DAILY BEFORE BREAKFAST (AM) 10/11/22   Charlott Rakes, MD  losartan (COZAAR) 25 MG tablet TAKE 2 TABLETS (50 MG TOTAL) BY MOUTH DAILY (AM) 10/10/22   Charlott Rakes, MD  Omega-3 Fatty Acids (FISH OIL PO) Take 1 capsule by mouth every morning.    [provider]  ondansetron (ZOFRAN) 4 MG tablet Take 1 tablet (4 mg total) by mouth every 8 (eight) hours as needed for nausea or vomiting. 08/11/21   Charlott Rakes, MD  pantoprazole (PROTONIX) 40 MG tablet TAKE 1 TABLET (40 MG TOTAL) BY MOUTH DAILY(AM) 10/10/22   Charlott Rakes, MD  rosuvastatin (CRESTOR) 40 MG tablet Take 1 tablet (40 mg total) by mouth daily. 09/07/22   Warren Lacy, PA-C  Semaglutide,0.25 or 0.5MG /DOS, (OZEMPIC, 0.25 OR 0.5 MG/DOSE,) 2 MG/1.5ML SOPN Inject 0.5 mg into the skin once a week. 10/10/22   Charlott Rakes, MD  spironolactone (ALDACTONE) 25 MG tablet TAKE 0.5 TABLET (12.5 MG TOTAL) BY MOUTH DAILY (AM) 10/11/22   Larey Dresser, MD  torsemide  (DEMADEX) 20 MG tablet TAKE 100 MG (5 TABLETS) IN THE MORNING AND 80 MG (4 TABLETS) IN THE EVENING.    [provider]  triamcinolone 0.1% oint-Cerave equivalent lotion 1:1 mixture Apply topically 2 (two) times daily as needed. 07/07/22   Argentina Donovan, PA-C  tiotropium (SPIRIVA HANDIHALER) 18 MCG inhalation capsule Place 1 capsule (18 mcg total) into inhaler and inhale every morning. Patient not taking: Reported on 02/09/2021 12/09/19 02/09/21  Eugenie Filler, MD      Allergies    Gabapentin and Lyrica [pregabalin]    Review of Systems   Review of Systems  Physical Exam Updated Vital  Signs BP 103/67 (BP Location: Left Arm)   Pulse 81   Temp 98 F (36.7 C) (Oral)   Resp 19   Ht 5\' 4"  (1.626 m)   Wt 102.1 kg   LMP  (LMP Unknown)   SpO2 100%   BMI 38.62 kg/m  Physical Exam Vitals and nursing note reviewed.  Constitutional:      General: She is not in acute distress.    Appearance: Normal appearance. She is obese.  HENT:     Head: Normocephalic.     Comments: L-sided facial bruising, no swelling, no facial bony tenderness to palpation    Nose: Nose normal.     Mouth/Throat:     Mouth: Mucous membranes are moist.     Pharynx: Oropharynx is clear.  Eyes:     Extraocular Movements: Extraocular movements intact.     Conjunctiva/sclera: Conjunctivae normal.  Neck:     Comments: No midline neck tenderness Cardiovascular:     Rate and Rhythm: Normal rate and regular rhythm.     Pulses: Normal pulses.     Heart sounds: Normal heart sounds.  Pulmonary:     Effort: Pulmonary effort is normal.     Breath sounds: Normal breath sounds.  Abdominal:     General: Abdomen is flat.     Palpations: Abdomen is soft.     Tenderness: There is no abdominal tenderness.  Musculoskeletal:        General: Normal range of motion.     Cervical back: Normal range of motion and neck supple.     Comments: No midline back tenderness Tenderness to palpation of L lateral ribs with  overlying bruising Tenderness to palpation of L hip with overlying bruising, full hip internal/external rotation Tenderness to palpation of L hip with proximal swelling, full knee ROM, no knee joint laxity or palpable effusoin No bony tenderness to RUE, LUE or RLE  Skin:    General: Skin is warm and dry.     Findings: Bruising (As above) present.  Neurological:     General: No focal deficit present.     Mental Status: She is alert and oriented to person, place, and time.     Sensory: No sensory deficit.     Motor: No weakness.  Psychiatric:        Mood and Affect: Mood normal.        Behavior: Behavior normal.     ED Results / Procedures / Treatments   Labs (all labs ordered are listed, but only abnormal results are displayed) Labs Reviewed - No data to display  EKG None  Radiology DG Ribs Unilateral W/Chest Left  Result Date: 11/24/2022 CLINICAL DATA:  Trauma, fall EXAM: LEFT RIBS AND CHEST - 3+ VIEW COMPARISON:  04/18/2022 FINDINGS: Transverse diameter of heart is slightly increased. There are no signs of pulmonary edema or focal pulmonary consolidation. There is a linear metallic density in the course of left lower lobe pulmonary artery, possibly a monitoring device. There is no pleural effusion or pneumothorax. No displaced fracture is seen in left ribs. IMPRESSION: No displaced fracture is seen in left ribs. No active cardiopulmonary disease. Electronically Signed   By: Elmer Picker M.D.   On: 11/24/2022 16:30   DG Knee Complete 4 Views Left  Result Date: 11/24/2022 CLINICAL DATA:  Trauma, fall EXAM: LEFT KNEE - COMPLETE 4+ VIEW COMPARISON:  None Available. FINDINGS: No recent fracture or dislocation is seen. There is soft tissue fullness in suprapatellar bursa suggesting moderate to  large effusion. Degenerative changes are noted with bony spurs in medial, lateral and patellofemoral compartments. Arterial calcifications are seen in soft tissues. There are surgical clips  in the lower thigh and upper leg. IMPRESSION: No recent fracture or dislocation is seen in left knee. Degenerative changes are noted. Moderate to large effusion is present in suprapatellar bursa in the left knee. Arteriosclerosis. Electronically Signed   By: Elmer Picker M.D.   On: 11/24/2022 16:28   CT Head Wo Contrast  Result Date: 11/24/2022 CLINICAL DATA:  Head trauma, coagulopathy (Age 28-64y) EXAM: CT HEAD WITHOUT CONTRAST TECHNIQUE: Contiguous axial images were obtained from the base of the skull through the vertex without intravenous contrast. RADIATION DOSE REDUCTION: This exam was performed according to the departmental dose-optimization program which includes automated exposure control, adjustment of the mA and/or kV according to patient size and/or use of iterative reconstruction technique. COMPARISON:  CT head 10/19/2021. 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 No evidence of acute large vascular territory infarct, acute hemorrhage mass lesion, midline shift or hydrocephalus. Vascular: No hyperdense vessel identified. Calcific atherosclerosis. Skull: No acute fracture. Sinuses/Orbits: Largely clear sinuses.  No acute orbital findings. Other: No mastoid effusions. IMPRESSION: No evidence of acute intracranial abnormality. Electronically Signed   By: Margaretha Sheffield M.D.   On: 11/24/2022 16:26   DG Hip Unilat With Pelvis 2-3 Views Left  Result Date: 11/24/2022 CLINICAL DATA:  Trauma, fall EXAM: DG HIP (WITH OR WITHOUT PELVIS) 2-3V LEFT COMPARISON:  None Available. FINDINGS: No displaced fracture or dislocation is seen. In the AP view of left hip, there is questionable minimal indentation in the lateral margin of neck of the left femur. There is no disruption of trabeculae. This finding could not be seen in the rest of the images. Arterial calcifications are seen in soft tissues. Arterial calcifications are seen in soft tissues. IMPRESSION: No displaced  fracture or dislocation is seen. In the AP view of left hip, there is questionable minimal cortical irregularity in the lateral aspect of subcapital portion of the neck of the left femur. This may be an artifact or suggest undisplaced fracture. If there are continued symptoms, short-term follow-up radiographs and CT if warranted may be considered. Electronically Signed   By: Elmer Picker M.D.   On: 11/24/2022 16:22    Procedures Procedures    Medications Ordered in ED Medications  lidocaine (LIDODERM) 5 % 1 patch (1 patch Transdermal Patch Applied 11/24/22 1539)  acetaminophen (TYLENOL) tablet 650 mg (650 mg Oral Given 11/24/22 1539)    ED Course/ Medical Decision Making/ A&P Clinical Course as of 11/24/22 1735  Thu Nov 24, 2022  1732 C head CT was negative, hip x-ray showed possible artifact versus nondisplaced fracture and the patient was recommended repeat x-rays with her primary doctor if she is having continued pain.  Remainder of her x-rays are negative.  Patient was asleep and comfortable appearing upon my reassessment.  She was recommended to wear shoes within the home to help prevent falls and to follow-up with her primary doctor as she may benefit from physical therapy to prevent her frequent falls in the future.  She was given strict return precautions. [VK]    Clinical Course User Index [VK] Kemper Durie, DO                           Medical Decision Making This patient presents to the ED with chief complaint(s) of frequent falls  with pertinent past medical history of DVT on Eliquis, HTN, COPD, CHF which further complicates the presenting complaint. The complaint involves an extensive differential diagnosis and also carries with it a high risk of complications and morbidity.    The differential diagnosis includes patient's fall on thinners, concern for ICH, mass effect, she is low risk for cervical spine fracture [place] C-spine otherwise, considering rib fractures,  fracture fracture, pelvis fracture, no other traumatic injuries seen on exam  Additional history obtained: Additional history obtained from N/A Records reviewed Primary Care Documents  ED Course and Reassessment: Upon my evaluation, the patient is awake alert and well-appearing in no acute distress.  She does have significant bruising to the left side of her body including her left face, left ribs, left hip and left knee.  She will have head CT and x-rays performed to evaluate for traumatic injury and will be given Tylenol and lidocaine patch and will be closely reassessed.  She had no presyncopal symptoms making syncopal falls unlikely.  Independent labs interpretation:  N/A  Independent visualization of imaging: - I independently visualized the following imaging with scope of interpretation limited to determining acute life threatening conditions related to emergency care: CTH, L rib xray, L hip xr, L knee xr, which revealed no obvious traumatic injury  Consultation: - Consulted or discussed management/test interpretation w/ external professional: N/A  Consideration for admission or further workup: Patient has no emergent conditions requiring admission or further work-up at this time and is stable for discharge home with primary care follow-up  Social Determinants of health: N/A    Amount and/or Complexity of Data Reviewed Radiology: ordered.  Risk OTC drugs. Prescription drug management.          Final Clinical Impression(s) / ED Diagnoses Final diagnoses:  Fall, initial encounter  Contusion of face, initial encounter  Contusion of rib on left side, initial encounter  Pain of left hip    Rx / DC Orders ED Discharge Orders          Ordered    lidocaine (LIDODERM) 5 %  Every 24 hours        11/24/22 [place], Adams, DO 11/24/22 1735

## 2022-11-29 ENCOUNTER — Ambulatory Visit (HOSPITAL_COMMUNITY)
Admission: RE | Admit: 2022-11-29 | Discharge: 2022-11-29 | Disposition: A | Discharge status: Home / Self Care | Payer: 59 | Source: Ambulatory Visit | Attending: Cardiology | Admitting: Cardiology

## 2022-11-29 ENCOUNTER — Other Ambulatory Visit (HOSPITAL_COMMUNITY): Payer: Self-pay | Admitting: Pharmacist

## 2022-11-29 ENCOUNTER — Telehealth (HOSPITAL_COMMUNITY): Payer: Self-pay | Admitting: Adult Health

## 2022-11-29 VITALS — BP 134/62 | HR 92 | Ht 64.0 in | Wt 223.2 lb

## 2022-11-29 DIAGNOSIS — I739 Peripheral vascular disease, unspecified: Secondary | ICD-10-CM

## 2022-11-29 DIAGNOSIS — K7460 Unspecified cirrhosis of liver: Secondary | ICD-10-CM | POA: Diagnosis not present

## 2022-11-29 DIAGNOSIS — Z95820 Peripheral vascular angioplasty status with implants and grafts: Secondary | ICD-10-CM | POA: Insufficient documentation

## 2022-11-29 DIAGNOSIS — E1151 Type 2 diabetes mellitus with diabetic peripheral angiopathy without gangrene: Secondary | ICD-10-CM | POA: Insufficient documentation

## 2022-11-29 DIAGNOSIS — I6521 Occlusion and stenosis of right carotid artery: Secondary | ICD-10-CM | POA: Insufficient documentation

## 2022-11-29 DIAGNOSIS — G4733 Obstructive sleep apnea (adult) (pediatric): Secondary | ICD-10-CM | POA: Diagnosis not present

## 2022-11-29 DIAGNOSIS — Z79899 Other long term (current) drug therapy: Secondary | ICD-10-CM | POA: Diagnosis not present

## 2022-11-29 DIAGNOSIS — Z7984 Long term (current) use of oral hypoglycemic drugs: Secondary | ICD-10-CM | POA: Insufficient documentation

## 2022-11-29 DIAGNOSIS — Z7901 Long term (current) use of anticoagulants: Secondary | ICD-10-CM | POA: Insufficient documentation

## 2022-11-29 DIAGNOSIS — Z79890 Hormone replacement therapy (postmenopausal): Secondary | ICD-10-CM | POA: Insufficient documentation

## 2022-11-29 DIAGNOSIS — I480 Paroxysmal atrial fibrillation: Secondary | ICD-10-CM | POA: Insufficient documentation

## 2022-11-29 DIAGNOSIS — J449 Chronic obstructive pulmonary disease, unspecified: Secondary | ICD-10-CM | POA: Insufficient documentation

## 2022-11-29 DIAGNOSIS — E1122 Type 2 diabetes mellitus with diabetic chronic kidney disease: Secondary | ICD-10-CM | POA: Diagnosis not present

## 2022-11-29 DIAGNOSIS — N183 Chronic kidney disease, stage 3 unspecified: Secondary | ICD-10-CM | POA: Diagnosis not present

## 2022-11-29 DIAGNOSIS — E785 Hyperlipidemia, unspecified: Secondary | ICD-10-CM | POA: Insufficient documentation

## 2022-11-29 DIAGNOSIS — I130 Hypertensive heart and chronic kidney disease with heart failure and stage 1 through stage 4 chronic kidney disease, or unspecified chronic kidney disease: Secondary | ICD-10-CM | POA: Insufficient documentation

## 2022-11-29 DIAGNOSIS — I5032 Chronic diastolic (congestive) heart failure: Secondary | ICD-10-CM | POA: Diagnosis not present

## 2022-11-29 DIAGNOSIS — I5042 Chronic combined systolic (congestive) and diastolic (congestive) heart failure: Secondary | ICD-10-CM | POA: Diagnosis not present

## 2022-11-29 LAB — CBC
HCT: 29.7 % — ABNORMAL LOW (ref 36.0–46.0)
Hemoglobin: 9.3 g/dL — ABNORMAL LOW (ref 12.0–15.0)
MCH: 30.5 pg (ref 26.0–34.0)
MCHC: 31.3 g/dL (ref 30.0–36.0)
MCV: 97.4 fL (ref 80.0–100.0)
Platelets: 391 10*3/uL (ref 150–400)
RBC: 3.05 MIL/uL — ABNORMAL LOW (ref 3.87–5.11)
RDW: 14.1 % (ref 11.5–15.5)
WBC: 10.6 10*3/uL — ABNORMAL HIGH (ref 4.0–10.5)
nRBC: 0 % (ref 0.0–0.2)

## 2022-11-29 LAB — LIPID PANEL
Cholesterol: 105 mg/dL (ref 0–200)
HDL: 44 mg/dL (ref >40)
LDL Cholesterol: 46 mg/dL (ref 0–99)
Total CHOL/HDL Ratio: 2.4 RATIO
Triglycerides: 75 mg/dL (ref <150)
VLDL: 15 mg/dL (ref 0–40)

## 2022-11-29 LAB — BASIC METABOLIC PANEL
Anion gap: 13 (ref 5–15)
BUN: 49 mg/dL — ABNORMAL HIGH (ref 6–20)
CO2: 26 mmol/L (ref 22–32)
Calcium: 8.7 mg/dL — ABNORMAL LOW (ref 8.9–10.3)
Chloride: 99 mmol/L (ref 98–111)
Creatinine, Ser: 2.41 mg/dL — ABNORMAL HIGH (ref 0.44–1.00)
GFR, Estimated: 22 mL/min — ABNORMAL LOW (ref >60)
Glucose, Bld: 110 mg/dL — ABNORMAL HIGH (ref 70–99)
Potassium: 3.7 mmol/L (ref 3.5–5.1)
Sodium: 138 mmol/L (ref 135–145)

## 2022-11-29 MED ORDER — TORSEMIDE 100 MG PO TABS: 100.0000 mg | ORAL_TABLET | Freq: Two times a day (BID) | ORAL | 3 refills | Status: DC

## 2022-11-29 MED ORDER — POTASSIUM CHLORIDE CRYS ER 20 MEQ PO TBCR: 20.0000 meq | EXTENDED_RELEASE_TABLET | Freq: Two times a day (BID) | ORAL | 4 refills | Status: DC

## 2022-11-29 MED ORDER — METOLAZONE 2.5 MG PO TABS: 2.5000 mg | ORAL_TABLET | Freq: Every day | ORAL | 1 refills | Status: DC

## 2022-11-29 MED ORDER — METOLAZONE 2.5 MG PO TABS: 2.5000 mg | ORAL_TABLET | ORAL | 1 refills | Status: DC

## 2022-11-29 NOTE — Patient Instructions (Signed)
INCREASE Torsemide to 100 mg( 1 Tablet). Twice daily  TAKE Metolazone 2.5 mg x 1 tomorrow morning only   Tomorrow take potassium 40 meq ( 2 tablets ) Tomorrow only  Take potassium 20 meq daily afterwards  Labs done today, your results will be available in MyChart, we will contact you for abnormal readings.  Follow up lab work in 10 days   Your physician has requested that you have an echocardiogram. Echocardiography is a painless test that uses sound waves to create images of your heart. It provides your doctor with information about the size and shape of your heart and how well your heart's chambers and valves are working. This procedure takes approximately one hour. There are no restrictions for this procedure. Please do NOT wear cologne, perfume, aftershave, or lotions (deodorant is allowed). Please arrive 15 minutes prior to your appointment time.  Your physician recommends that you schedule a follow-up appointment in:  1 month  If you have any questions or concerns before your next appointment please send [place] a message through [place] or call our office at [phone].    TO LEAVE A MESSAGE FOR THE NURSE SELECT OPTION 2, PLEASE LEAVE A MESSAGE INCLUDING: YOUR NAME DATE OF BIRTH CALL BACK NUMBER REASON FOR CALL**this is important as we prioritize the call backs  YOU WILL RECEIVE A CALL BACK THE SAME DAY AS LONG AS YOU CALL BEFORE 4:00 PM  At the [facility], you and your health needs are our priority. As part of our continuing mission to provide you with exceptional heart care, we have created designated Provider Care Teams. These Care Teams include your primary Cardiologist (physician) and Advanced Practice Providers (APPs- Physician Assistants and Nurse Practitioners) who all work together to provide you with the care you need, when you need it.   You may see any of the following providers on your designated Care Team at your next follow up: Dr Glori Bickers Dr Loralie Champagne Dr. Roxana Hires, NP Lyda Jester, [place] [facility], [place] Forestine Na, NP Audry Riles, PharmD   Please be sure to bring in all your medications bottles to every appointment.

## 2022-11-29 NOTE — Telephone Encounter (Signed)
  Cardiomems Remote Monitoring  S/P Cardiomems Implant   PAD Goal: 20 Most recent reading: 24 suggestive of fluid accumulation   Recommended changes: Please call. Instruct to take 100 mg torsemide twice a day x 2 days then back to torsemide 100 mg /80 mg daily.   I continue to review and analyze the patients PA pressures weekly (and more often as needed) to bring PA pressures within the optimal range.            Carlise Stofer NP-C  9:58 AM

## 2022-11-29 NOTE — Progress Notes (Signed)
Patient ID: [name], female   DOB: [birth year], 62 y.o.   MRN: [id number]    Advanced Heart Failure Clinic Note   PCP: [facility] PV: Dr. Gwenlyn Found HF Cardiology: Dr. Aundra Dubin  [name] is a 62 y.o. with history of PAD, carotid stenosis s/p left CEA, relatively mild CAD, chronic systolic CHF, paroxysmal atrial fibrillation and prior substance abuse presents for CHF clinic followup.  She was admitted in 1/17 with acute hypoxemic respiratory failure in the setting of atrial fibrillation/RVR and volume overload.  She was initially intubated.  She converted back to NSR with amiodarone gtt.  She was treated with IV Lasix, steroids, bronchodilators.  She developed AKI and losartan was stopped.    She was admitted in 3/17 with symptomatic bradycardia.  She was supposed to stop diltiazem after this admission but continued to take it.  She presented later in 3/17, again with symptomatic bradycardia (junctional bradycardia), hypotension, and AKI.  Diltiazem and bisoprolol were stopped.  HR recovered and she has had no problems since.  ACEI was stopped with AKI.   In 5/18, she had peripheral angiography with atherectomy of right SFA.  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 7/18 ABIs showed 0.65 on right, 0.31 on left.  In 8/20, she had peripheral angiography showing totally occluded left SFA.  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 Donnetta Hutching.  ABIs in 2/21 were 0.44 on right, 0.99 on left.   Echo in 1/21 showed EF 50%, mild LVH, normal RV, PASP 60 mmHg.   Echo 03/08/21 EF 55-60% with basal to mid inferolateral hypokinesis.   Admitted 6/22 for mechanical fall, found to have AKI on CKD. She was given IV lasix and her losartan was held.  Underwent PV angiogram with Dr. Gwenlyn Found 7/22. She had drug-coated balloon angioplasty of the right SFA after being admitted for critical ischemia of the right foot.  Repeat peripheral  arterial dopplers in 8/22 still showed severe right SFA disease.   Admitted 8/22 for bradycardia and AKI, bisoprolol subsequently stopped; amiodarone was continued.   Today she returns for HF follow up. Weight is stable.  She has had several falls in the last few months due to tripping on her rug.  She has neuropathic pain in her feet bilaterally and also gets knee pain.  No clear claudication.   She is not very active in general due to instability.  She walks with a walker around her house but goes longer distances in a wheelchair.  She gets short of breath climbing stairs and inclines.    Cardiomems: PADP 24, goal 20  ECG (personally reviewed): NSR, old ASMI.   Labs (1/17): K 5.2, creatinine 1.4, AST 43, ALT normal Labs (2/17): K 4.2, creatinine 1.3, BNP 607, TSH normal Labs (3/17): K 4.2, creatinine 1.45, AST/ALT normal, HCT 28.7 Labs (4/17): K 4.4, creatinine 1.61 Labs (08/31/2016): K 4.5 Creatinine 1.43  Labs (11/17): K 4.2, creatinine 1.4 Labs (9/18): K 4.1, creatinine 1.1, TSH normal Labs 04/13/2018: K 5 Creatinine 1.75 Labs (2/21): K 4.1, creatinine 1.58 Labs (4/22): K 3.7, creatinine 1.55 Labs (5/22): K 3.8, creatinine 1.54 Labs (8/22): K 4.0, creatinine 2.07 Labs (11/22): TSH normal, LDL 59 Labs (12/22): K 3.6, creatinine 1.28, BNP 256, AST/ALT normal Labs (3/23): K 4.3, creatinine 1.82 => 1.7, hgb 11 Labs (5/22) K 3.6, creatinine 2.2, TG 405  Labs (6/22) K 3.6, creatinine 1.34  Labs (8/23):  K 4.7, creatinine 2.11, LDL 93, TGs 315 Labs (10/23): K 3.4, creatinine 1.35  PMH: 1. Carotid stenosis: Known occluded right carotid.  Left CEA in 4/16.  - Carotid dopplers (7/21): CTO RICA, mild disease LICA.  - Carotid dopplers (7/22): CTO RICA, 3-47% LICA.  - Carotid dopplers (3/23): Totally occluded RICA, 4-25% LICA.  2. CAD: LHC in 12/15 with 80% stenosis in small OM1, nonobstructive disease in other territories.  3. Chronic systolic CHF: Nonischemic cardiomyopathy (?due to ETOH  or prior drug abuse).  She has a Cardiomems.  - Echo (1/17) with EF 45%, mild LV hypertrophy, moderate diastolic dysfunction, inferolateral severe hypokinesis, mildly decreased RV systolic function.  - Echo (1/18): EF 40-45%, mild LVH, normal RV size and systolic function.  - Echo (1/21): EF 50%, mild LVH, normal RV, PASP 60 mmHg.  - Echo (4/22): EF 55-60%, basal-mid inferolateral hypokinesis with grade II diastolic dysfunction, RV normal, PASP 42 4. Atrial fibrillation: Paroxysmal.   5. Type II diabetes 6. CKD: Stage III. 7. COPD: Smokes 1 ppd.  8. 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  No longer drinks.  9. Hypothyroidism 10. PAD: Atherectomy SFA in 2014 (Dr Gwenlyn Found).  Peripheral arterial dopplers (2/17) with focal 75-99% proximal right SFA stenosis, occluded mid-distal right SFA, chronic occlusion of all runoff arteries on right. - In 5/18, she had peripheral angiography with atherectomy of right SFA.   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  - 7/18 ABIs showed 0.65 on right, 0.31 on left.   - Peripheral angiography 8/20: Totally occluded left SFA.  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 Donnetta Hutching.   - ABIs (2/21): 0.44 R, 0.99 L.  - Balloon angioplasty right SFA 7/22.  - ABIs (8/22): severe right SFA disease.  - peripheral arterial dopplers (2/23): 50-74% right SFA and right popliteal. Stable.  11. Anemia 12. Prior cocaine abuse.  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  14. OSA: Has not been able to tolerate CPAP.  15. Diabetic neuropathy.   SH: Lives with sister.  Prior cocaine abuse.  Prior ETOH abuse.  Quit smoking in 1/17, restarted, and quit again in 4/19, restarted again.  FH: CAD  Review of systems complete and found to be negative unless listed in HPI.    Current Outpatient Medications  Medication Sig Dispense Refill   ACCU-CHEK GUIDE test strip USE TO CHECK BLOOD  SUGAR FOUR TIMES DAILY     acetaminophen (TYLENOL) 500 MG tablet Take 1,000 mg by mouth every 6 (six) hours as needed for headache (pain).     albuterol (VENTOLIN HFA) 108 (90 Base) MCG/ACT inhaler USE 2 PUFFS BY MOUTH EVERY FOUR HOURS, AS NEEDED, FOR COUGHING/WHEEZING 8.5 g 3   allopurinol (ZYLOPRIM) 100 MG tablet TAKE 1 TABLET (100 MG TOTAL) BY MOUTH DAILY(AM) 90 tablet 0   amiodarone (PACERONE) 200 MG tablet TAKE 1/2 TABLET (100 MG TOTAL) BY MOUTH DAILY (AM) 15 tablet 3   amLODipine (NORVASC) 10 MG tablet Take 1 tablet (10 mg total) by mouth every morning. 90 tablet 1   Ascorbic Acid (VITAMIN C PO) Take 1 tablet by mouth every morning.     Aspirin-Salicylamide-Caffeine (BC HEADACHE POWDER PO) Take 1 packet by mouth 4 (four) times daily as needed (pain/headache).     budesonide-formoterol (SYMBICORT) 160-4.5 MCG/ACT inhaler Inhale 2 puffs into the lungs 2 (two) times daily. 1 each 2   buPROPion [facility]  SR) 150 MG 12 hr tablet TAKE 1 TABLET BY MOUTH DAILY FOR 3 DAYS THEN INCREASE TO 1 TABLET BY MOUTH 2 TIMES DAILY(AM+BEDTIME) (Patient taking differently: 150 mg 2 (two) times daily.) 60 tablet 3   Cholecalciferol (VITAMIN D3 PO) Take 1 capsule by mouth every morning.     colchicine 0.6 MG tablet TAKE 2 TABS (1.2 MG) AT THE ONSET OF A GOUT FLARE, MAY TAKE 1 TAB (0.6 MG) IN 1 HOUR IF SYMPTOMS 30 tablet 3   Continuous Blood Gluc Sensor (FREESTYLE LIBRE 2 SENSOR) MISC USE 1 (ONE) EACH EVERY 2 WEEKS 2 each 11   Dulaglutide (TRULICITY) 6.06 TK/1.6WF SOPN Inject 0.75 mg into the skin every Friday. 2 mL 3   DULoxetine (CYMBALTA) 60 MG capsule Take 1 capsule (60 mg total) by mouth daily. For diabetic neuropathy 30 capsule 3   EASY COMFORT PEN NEEDLES 31G X 5 MM MISC USE FOUR TIMES A DAY FOR INSULIN ADMINISTRATION 100 each 1   ELIQUIS 5 MG TABS tablet TAKE 1 TABLET (5 MG TOTAL) BY MOUTH 2 (TWO) TIMES DAILY (AM+BEDTIME) 60 tablet 8   empagliflozin (JARDIANCE) 10 MG TABS tablet Take 1 tablet (10 mg total)  by mouth daily. TAKE 1 TABLET before breakfast daily 90 tablet 3   FEROSUL 325 (65 Fe) MG tablet TAKE 1 TABLET (324 MG TOTAL) BY MOUTH 2 (TWO) TIMES DAILY AS NEEDED. (AM+BEDTIME) 60 tablet 3   folic acid (FOLVITE) 1 MG tablet Take 1 tablet (1 mg total) by mouth daily. 90 tablet 1   hydrALAZINE (APRESOLINE) 50 MG tablet TAKE 1 & 1/2 TABLETS (75 MG TOTAL) BY MOUTH 3 (THREE) TIMES DAILY (AM+NOON+BEDTIME) 140 tablet 4   ibuprofen (ADVIL) 600 MG tablet Take 1 tablet by mouth as needed.     icosapent Ethyl (VASCEPA) 1 g capsule Take 2 capsules (2 g total) by mouth 2 (two) times daily. 120 capsule 6   insulin lispro (HUMALOG) 100 UNIT/ML KwikPen Inject 10 Units into the skin 3 (three) times daily with meals. 15 mL 3   LANTUS SOLOSTAR 100 UNIT/ML Solostar Pen Inject 37 Units into the skin daily. (Patient taking differently: Inject 37 Units into the skin at bedtime.) 15 mL 3   levothyroxine (SYNTHROID) 50 MCG tablet TAKE 1 TABLET (50 MCG TOTAL) BY MOUTH DAILY BEFORE BREAKFAST (AM) 90 tablet 1   lidocaine (LIDODERM) 5 % Place 1 patch onto the skin daily. Remove & Discard patch within 12 hours or as directed by MD 30 patch 0   losartan (COZAAR) 25 MG tablet TAKE 2 TABLETS (50 MG TOTAL) BY MOUTH DAILY (AM) 180 tablet 0   Omega-3 Fatty Acids (FISH OIL PO) Take 1 capsule by mouth every morning.     ondansetron (ZOFRAN) 4 MG tablet Take 1 tablet (4 mg total) by mouth every 8 (eight) hours as needed for nausea or vomiting. 20 tablet 0   pantoprazole (PROTONIX) 40 MG tablet TAKE 1 TABLET (40 MG TOTAL) BY MOUTH DAILY(AM) 90 tablet 0   potassium chloride SA (KLOR-CON M) 20 MEQ tablet Take 1 tablet (20 mEq total) by mouth 2 (two) times daily. 60 tablet 4   rosuvastatin (CRESTOR) 40 MG tablet Take 1 tablet (40 mg total) by mouth daily. 90 tablet 3   Semaglutide,0.25 or 0.5MG /DOS, (OZEMPIC, 0.25 OR 0.5 MG/DOSE,) 2 MG/1.5ML SOPN Inject 0.5 mg into the skin once a week. 2 mL 6   spironolactone (ALDACTONE) 25 MG tablet TAKE  0.5 TABLET (12.5 MG TOTAL) BY MOUTH DAILY (AM)  45 tablet 3   triamcinolone 0.1% oint-Cerave equivalent lotion 1:1 mixture Apply topically 2 (two) times daily as needed. 454 g 1   metolazone (ZAROXOLYN) 2.5 MG tablet Take 1 tablet (2.5 mg total) by mouth as directed. Take as needed for fluid retention as instructed by HF clinic 5 tablet 1   torsemide (DEMADEX) 100 MG tablet Take 1 tablet (100 mg total) by mouth 2 (two) times daily. Please cancel all previous orders for current medication. Change in dosage or pill size. 60 tablet 3   No current facility-administered medications for this encounter.   BP 134/62   Pulse 92   Ht 5\' 4"  (1.626 m)   Wt 101.2 kg (223 lb 3.2 oz)   LMP  (LMP Unknown)   SpO2 94%   BMI 38.31 kg/m   Wt Readings from Last 3 Encounters:  11/29/22 101.2 kg (223 lb 3.2 oz)  11/24/22 102.1 kg (225 lb)  09/07/22 102.1 kg (225 lb)  General: NAD Neck: JVP 10 cm, no thyromegaly or thyroid nodule.  Lungs: Clear to auscultation bilaterally with normal respiratory effort. CV: Nondisplaced PMI.  Heart regular S1/S2, no S3/S4, no murmur.  1+ edema to knees.  No carotid bruit.  Unable to palpate pedal pulses.  Abdomen: Soft, nontender, no hepatosplenomegaly, no distention.  Skin: Intact without lesions or rashes.  Neurologic: Alert and oriented x 3.  Psych: Normal affect. Extremities: No clubbing or cyanosis.  HEENT: Normal.   Assessment/Plan: 1. Chronic diastolic CHF: LHC in 69/62 showing only 80% stenosis in small OM1. EF as low as 40-45% in the past. Echo 4/22 showed EF 55-60% with grade 2 diastolic dysfunction.   Not very active, NYHA class III symptoms. She is volume overloaded by exam and Cardiomems.  - Increase torsemide to 100 mg bid and will give 1 dose of metolazone 2.5.  BMET/BNP today, BMET in 10 days.  - Take KCl 40 mEq with the metolazone dose then 20 mEq daily after that.  - She will need a repeat echo.  - Continue spiro 12.5 mg daily  - Continue losartan 50 mg  daily.  - Continue Jardiance 10 mg daily. No GU symptoms. - Off bisoprolol due to bradycardia.   2. Atrial fibrillation: Paroxysmal. NSR today.  - Continue amiodarone 100 mg daily. Check LFTs.  She is on levothyroxine, followed by PCP.  Needs regular eye exam.  - Continue Eliquis 5 mg bid.  CBC today.  3. PAD: Claudication bilaterally.  Not candidate for cilostazol with CHF.  She has had a left fem-pop bypass in 8/20.  ABIs in 2/21 were very abnormal on right. She underwent PV angiogram with balloon angioplasty of the right SFA 7/22.  8/22 dopplers still showed severe right SFA disease. Dopplers 2/23 showed patent right SFA.  Peripheral arterial dopplers in 2/23 showed 50-74% stenosis right SFA and popliteal.  She has no definite claudication, has knee pain and neuropathic pain in feet.  - She is followed by Dr. Gwenlyn Found, has appointment.  - Continue atorvastatin, check lipids today.  4. COPD:  She has stayed off cigarettes.   5. H/o cirrhosis: Likely from ETOH, she has quit.  6. OSA: Not able to tolerate CPAP.   7. CKD stage III:  Baseline SCr ~1.5 - Follow BMET closely, this may limit diuresis.  8. HTN: Controlled.   9.Type 2 diabetes: Per PCP.   10. CAD: LHC in 12/15 with 80% stenosis in small OM1, nonobstructive disease in other territories. No chest pain. - Continue  Crestor, check lipids today. Goal LDL < 55.  - Continue Eliquis. 11. Carotid stenosis: Stable in 3/23.   12. Hyperlipidemia: She is on Crestor and Vascepa.  - Lipids today.   Followup 1 month with APP.   Followup 3 months with Dr. Aundra Dubin.  Loralie Champagne, MD 11/29/2022

## 2022-11-29 NOTE — Telephone Encounter (Signed)
Pt aware.

## 2022-12-03 ENCOUNTER — Other Ambulatory Visit (HOSPITAL_COMMUNITY): Payer: Self-pay | Admitting: Cardiology

## 2022-12-05 ENCOUNTER — Encounter (HOSPITAL_COMMUNITY): Payer: Self-pay

## 2022-12-09 ENCOUNTER — Encounter (HOSPITAL_COMMUNITY): Payer: Self-pay

## 2022-12-09 ENCOUNTER — Ambulatory Visit (HOSPITAL_COMMUNITY)
Admission: RE | Admit: 2022-12-09 | Discharge: 2022-12-09 | Disposition: A | Discharge status: Home / Self Care | Payer: 59 | Source: Ambulatory Visit | Attending: Cardiology | Admitting: Cardiology

## 2022-12-09 DIAGNOSIS — I5032 Chronic diastolic (congestive) heart failure: Secondary | ICD-10-CM

## 2022-12-09 LAB — BASIC METABOLIC PANEL
Anion gap: 16 — ABNORMAL HIGH (ref 5–15)
BUN: 81 mg/dL — ABNORMAL HIGH (ref 6–20)
CO2: 26 mmol/L (ref 22–32)
Calcium: 8.7 mg/dL — ABNORMAL LOW (ref 8.9–10.3)
Chloride: 92 mmol/L — ABNORMAL LOW (ref 98–111)
Creatinine, Ser: 3.58 mg/dL — ABNORMAL HIGH (ref 0.44–1.00)
GFR, Estimated: 14 mL/min — ABNORMAL LOW (ref >60)
Glucose, Bld: 241 mg/dL — ABNORMAL HIGH (ref 70–99)
Potassium: 4 mmol/L (ref 3.5–5.1)
Sodium: 134 mmol/L — ABNORMAL LOW (ref 135–145)

## 2022-12-16 ENCOUNTER — Ambulatory Visit (HOSPITAL_COMMUNITY): Admission: RE | Admit: 2022-12-16 | Payer: 59 | Source: Ambulatory Visit

## 2022-12-16 NOTE — Telephone Encounter (Signed)
Pt returned call regarding lab results Pt voiced understanding Reports she hasn't taken metolazone in a long time -with pill packs pt will do the best she can to identify which meds to hold, reports she will call pharmacist if she needs further assistance

## 2022-12-21 ENCOUNTER — Other Ambulatory Visit (HOSPITAL_COMMUNITY): Payer: 59

## 2022-12-22 ENCOUNTER — Ambulatory Visit: Payer: Self-pay

## 2022-12-22 NOTE — Telephone Encounter (Signed)
    Chief Complaint: Skin wound left shin. Asking to be seen sooner than 12/30/22. Symptoms: Above Frequency: 1 week Pertinent Negatives: Patient denies  Disposition: [] ED /[] Urgent Care (no appt availability in office) / [] Appointment(In office/virtual)/ []  [facility]/ [] Home Care/ [] Refused Recommended Disposition /[] [place] [facility]/ [x]  Follow-up with PCP Additional Notes: Please advise pt.   Answer Assessment - Initial Assessment Questions 1. APPEARANCE of INJURY: "What does the injury look like?"      3 x 1 1/2 inch wound.Above 2. SIZE: "How large is the cut?"      Above 3. BLEEDING: "Is it bleeding now?" If Yes, ask: "Is it difficult to stop?"      No 4. LOCATION: "Where is the injury located?"      Left shin 5. ONSET: "How long ago did the injury occur?"      This week 6. MECHANISM: "Tell me how it happened."      Hit it on cement 7. TETANUS: "When was the last tetanus booster?"      8. PREGNANCY: "Is there any chance you are pregnant?" "When was your last menstrual period?"     No  Protocols used: Skin Injury-A-AH

## 2022-12-22 NOTE — Telephone Encounter (Signed)
Call placed to patient unable to reach message left on VM.  

## 2022-12-28 ENCOUNTER — Other Ambulatory Visit: Payer: Self-pay | Admitting: Physician Assistant

## 2022-12-28 ENCOUNTER — Other Ambulatory Visit (HOSPITAL_COMMUNITY): Payer: Self-pay | Admitting: Cardiology

## 2022-12-28 ENCOUNTER — Other Ambulatory Visit: Payer: Self-pay | Admitting: Family Medicine

## 2022-12-28 ENCOUNTER — Ambulatory Visit: Payer: Self-pay

## 2022-12-28 DIAGNOSIS — I10 Essential (primary) hypertension: Secondary | ICD-10-CM

## 2022-12-28 DIAGNOSIS — M109 Gout, unspecified: Secondary | ICD-10-CM

## 2022-12-28 NOTE — Patient Outreach (Signed)
  Care Coordination   Initial Visit Note   12/28/2022 Name: [name] MRN: [id number] DOB: 23-Apr-1961  [name] is a 62 y.o. year old female who sees Charlott Rakes, MD for primary care. I spoke with  [name] by phone today.  What matters to the patients health and wellness today?  I have been falling a lot.    Goals Addressed             This Visit's Progress    COMPLETED: Care Coordination Activities       Care Coordination Interventions: SDoH screening performed - no acute resource challenges identified at this time Discussed patient has recently moved and in the last 2 months she has fallen 6 times Reviewed patient has not been using her walker and/or wheelchair at all times Discussed the importance of utilizing DME when ambulating to help prevent falls Confirmed patient does have a life alert and wears it in the event she needs to call for help Education on care coordination team - patient agreeable to speak with RN Manager Glenard Haring Little by phone, visit scheduled 2/27 Collaboration with RN Care Manager advising of patient enrollment and scheduled appointment         SDOH assessments and interventions completed:  Yes  SDOH Interventions Today    Flowsheet Row Most Recent Value  SDOH Interventions   Food Insecurity Interventions Intervention Not Indicated  Housing Interventions Intervention Not Indicated  Transportation Interventions Intervention Not Indicated  Utilities Interventions Intervention Not Indicated        Care Coordination Interventions:  Yes, provided   Interventions Today    Flowsheet Row Most Recent Value  Chronic Disease   Chronic disease during today's visit Diabetes, Congestive Heart Failure (CHF)  General Interventions   General Interventions Discussed/Reviewed General Interventions Discussed, Referral to Nurse  Safety Interventions   Safety Discussed/Reviewed Fall Risk, Safety Discussed  [Encouraged to use DME, continue  wearing life alert]      Follow up plan: Follow up call scheduled for 2/27 with [place]    Encounter Outcome:  Pt. Visit Completed   Daneen Schick, Arita Miss, CDP Social Worker, Certified Dementia Practitioner [facility] Care Management  Care Coordination [phone]

## 2022-12-28 NOTE — Patient Instructions (Signed)
Visit Information  Thank you for taking time to visit with me today. Please don't hesitate to contact me if I can be of assistance to you.   Following are the goals we discussed today:   Goals Addressed             This Visit's Progress    COMPLETED: Care Coordination Activities       Care Coordination Interventions: SDoH screening performed - no acute resource challenges identified at this time Discussed patient has recently moved and in the last 2 months she has fallen 6 times Reviewed patient has not been using her walker and/or wheelchair at all times Discussed the importance of utilizing DME when ambulating to help prevent falls Confirmed patient does have a life alert and wears it in the event she needs to call for help Education on care coordination team - patient agreeable to speak with RN Manager Glenard Haring Little by phone, visit scheduled 2/27 Collaboration with RN Care Manager advising of patient enrollment and scheduled appointment         Your next appointment is by telephone on 2/27 at 10:30 am with [place]  Please call the care guide team at [phone] if you need to cancel or reschedule your appointment.   If you are experiencing a Mental Health or [place] or need someone to talk to, please call [phone]  Patient verbalizes understanding of instructions and care plan provided today and agrees to view in [place]. Active MyChart status and patient understanding of how to access instructions and care plan via MyChart confirmed with patient.     Telephone follow up appointment with care management team member scheduled for:2/27  Daneen Schick, Arita Miss, CDP Social Worker, Certified Dementia Practitioner [place] Management  Care Coordination [phone]

## 2022-12-29 ENCOUNTER — Other Ambulatory Visit (HOSPITAL_COMMUNITY): Payer: Self-pay | Admitting: Cardiology

## 2022-12-29 NOTE — Telephone Encounter (Signed)
Requested medication (s) are due for refill today:   Yes for all 3  Requested medication (s) are on the active medication list:   Yes for all 3  Future visit scheduled:   Yes 01/05/2023   Last ordered: Losartan 10/10/2022 #180, 0 refills;   Protonix 10/10/2022 #90, 0 refills;   Allopurinol 10/10/2022 #90, 0 refills  Returned because uric acid level is overdue per protocol.    Requested Prescriptions  Pending Prescriptions Disp Refills   losartan (COZAAR) 25 MG tablet [Pharmacy Med Name: LOSARTAN POTASSIUM 25 MG ORAL TABLET] 180 tablet 0    Sig: TAKE 2 TABLETS (50 MG TOTAL) BY MOUTH DAILY (AM)     Cardiovascular:  Angiotensin Receptor Blockers Failed - 12/28/2022  5:52 PM      Failed - Cr in normal range and within 180 days    Creat  Date Value Ref Range Status  01/25/2016 1.09 (H) 0.50 - 1.05 mg/dL Final   Creatinine, Ser  Date Value Ref Range Status  12/09/2022 3.58 (H) 0.44 - 1.00 mg/dL Final   Creatinine, Urine  Date Value Ref Range Status  08/18/2018 72.60 mg/dL Final    Comment:    Performed at [facility], [place] [address], [place], [place] [address]         Passed - K in normal range and within 180 days    Potassium  Date Value Ref Range Status  12/09/2022 4.0 3.5 - 5.1 mmol/L Final         Passed - Patient is not pregnant      Passed - Last BP in normal range    BP Readings from Last 1 Encounters:  11/29/22 134/62         Passed - Valid encounter within last 6 months    Recent Outpatient Visits           2 months ago Pre-ulcerative calluses   [place], [place], MD   3 months ago DM (diabetes mellitus), type 2 with peripheral vascular complications [facility])   Wing, Thibodaux L, RPH-CPP   5 months ago DM (diabetes mellitus), type 2 with peripheral vascular complications [facility])   [place], [place], [place]   1 year  ago DM (diabetes mellitus), type 2 with peripheral vascular complications [facility])   [place], Charlane Ferretti, MD   1 year ago DM (diabetes mellitus), type 2 with peripheral vascular complications [facility])   [place], Dionne [place], [place]       Future Appointments             In 1 week Charlott Rakes, MD [place]             pantoprazole ([place]) 40 MG tablet [Pharmacy Med Name: PANTOPRAZOLE SODIUM 40 MG ORAL TABLET DELAYED RELEASE] 90 tablet 0    Sig: TAKE 1 TABLET (40 MG TOTAL) BY MOUTH DAILY(AM)     Gastroenterology: Proton Pump Inhibitors Passed - 12/28/2022  5:52 PM      Passed - Valid encounter within last 12 months    Recent Outpatient Visits           2 months ago Pre-ulcerative calluses   [place], Charlane Ferretti, MD   3 months ago DM (diabetes mellitus), type 2  with peripheral vascular complications [facility])   [place], Jarome [place], RPH-CPP   5 months ago DM (diabetes mellitus), type 2 with peripheral vascular complications [facility])   [place], [place], [place]   1 year ago DM (diabetes mellitus), type 2 with peripheral vascular complications [facility])   [place], Charlane Ferretti, MD   1 year ago DM (diabetes mellitus), type 2 with peripheral vascular complications [facility])   [place], [place], [place]       Future Appointments             In 1 week Charlott Rakes, MD [place]             allopurinol (ZYLOPRIM) 100 MG tablet [Pharmacy Med Name: ALLOPURINOL 100 MG ORAL TABLET] 90 tablet 0    Sig: TAKE 1 TABLET (100 MG TOTAL) BY MOUTH DAILY(AM)     Endocrinology:  Gout Agents - allopurinol Failed - 12/28/2022  5:52  PM      Failed - Uric Acid in normal range and within 360 days    Uric Acid  Date Value Ref Range Status  08/11/2021 10.3 (H) 3.0 - 7.2 mg/dL Final    Comment:               Therapeutic target for gout patients: <6.0         Failed - Cr in normal range and within 360 days    Creat  Date Value Ref Range Status  01/25/2016 1.09 (H) 0.50 - 1.05 mg/dL Final   Creatinine, Ser  Date Value Ref Range Status  12/09/2022 3.58 (H) 0.44 - 1.00 mg/dL Final   Creatinine, Urine  Date Value Ref Range Status  08/18/2018 72.60 mg/dL Final    Comment:    Performed at [facility], [place] [address], [place], [place] [address]         Passed - Valid encounter within last 12 months    Recent Outpatient Visits           2 months ago Pre-ulcerative calluses   [place], Archie, MD   3 months ago DM (diabetes mellitus), type 2 with peripheral vascular complications [facility])   [place], Kaplan L, RPH-CPP   5 months ago DM (diabetes mellitus), type 2 with peripheral vascular complications [facility])   [place], [place], [place]   1 year ago DM (diabetes mellitus), type 2 with peripheral vascular complications [facility])   [place], Charlane Ferretti, MD   1 year ago DM (diabetes mellitus), type 2 with peripheral vascular complications [facility])   [place], [place], [place]       Future Appointments             In 1 week Charlott Rakes, MD [place] within normal limits and completed in the last 12 months    WBC  Date Value Ref Range Status  11/29/2022 10.6 (H) 4.0 - 10.5 K/uL Final   RBC  Date Value Ref Range Status  11/29/2022 3.05 (L) 3.87 - 5.11 MIL/uL Final  Hemoglobin  Date Value Ref Range Status  11/29/2022 9.3  (L) 12.0 - 15.0 g/dL Final  05/25/2021 10.5 (L) 11.1 - 15.9 g/dL Final   Total hemoglobin  Date Value Ref Range Status  12/03/2015 9.6 (L) 12.0 - 16.0 g/dL Final   HCT  Date Value Ref Range Status  11/29/2022 29.7 (L) 36.0 - 46.0 % Final   Hematocrit  Date Value Ref Range Status  05/25/2021 35.5 34.0 - 46.6 % Final   MCHC  Date Value Ref Range Status  11/29/2022 31.3 30.0 - 36.0 g/dL Final   [facility]  Date Value Ref Range Status  11/29/2022 30.5 26.0 - 34.0 pg Final   MCV  Date Value Ref Range Status  11/29/2022 97.4 80.0 - 100.0 fL Final  05/25/2021 88 79 - 97 fL Final   No results found for: "PLTCOUNTKUC", "LABPLAT", "POCPLA" RDW  Date Value Ref Range Status  11/29/2022 14.1 11.5 - 15.5 % Final  05/25/2021 14.1 11.7 - 15.4 % Final

## 2022-12-29 NOTE — Telephone Encounter (Signed)
Requested Prescriptions  Pending Prescriptions Disp Refills   folic acid (FOLVITE) 1 MG tablet [Pharmacy Med Name: FOLIC ACID 1 MG ORAL TABLET] 90 tablet 1    Sig: TAKE 1 TABLET (1 MG TOTAL) BY MOUTH DAILY (AM)     Endocrinology:  Vitamins Passed - 12/28/2022  5:53 PM      Passed - Valid encounter within last 12 months    Recent Outpatient Visits           2 months ago Pre-ulcerative calluses   [place], [place], MD   3 months ago DM (diabetes mellitus), type 2 with peripheral vascular complications [facility])   [place], Hopewell L, RPH-CPP   5 months ago DM (diabetes mellitus), type 2 with peripheral vascular complications [facility])   [place], [place], [place]   1 year ago DM (diabetes mellitus), type 2 with peripheral vascular complications [facility])   [place], Charlane Ferretti, MD   1 year ago DM (diabetes mellitus), type 2 with peripheral vascular complications [facility])   [place], [place], [place]       Future Appointments             In 1 week Charlott Rakes, MD [place]

## 2022-12-30 ENCOUNTER — Ambulatory Visit (HOSPITAL_COMMUNITY)
Admission: RE | Admit: 2022-12-30 | Discharge: 2022-12-30 | Disposition: A | Discharge status: Home / Self Care | Payer: 59 | Source: Ambulatory Visit | Attending: Family Medicine | Admitting: Family Medicine

## 2022-12-30 ENCOUNTER — Encounter (HOSPITAL_COMMUNITY): Payer: Self-pay

## 2022-12-30 VITALS — BP 110/70 | HR 95 | Wt 209.0 lb

## 2022-12-30 DIAGNOSIS — G4733 Obstructive sleep apnea (adult) (pediatric): Secondary | ICD-10-CM | POA: Insufficient documentation

## 2022-12-30 DIAGNOSIS — I130 Hypertensive heart and chronic kidney disease with heart failure and stage 1 through stage 4 chronic kidney disease, or unspecified chronic kidney disease: Secondary | ICD-10-CM | POA: Insufficient documentation

## 2022-12-30 DIAGNOSIS — I5032 Chronic diastolic (congestive) heart failure: Secondary | ICD-10-CM

## 2022-12-30 DIAGNOSIS — I5042 Chronic combined systolic (congestive) and diastolic (congestive) heart failure: Secondary | ICD-10-CM | POA: Diagnosis not present

## 2022-12-30 DIAGNOSIS — J449 Chronic obstructive pulmonary disease, unspecified: Secondary | ICD-10-CM | POA: Insufficient documentation

## 2022-12-30 DIAGNOSIS — I10 Essential (primary) hypertension: Secondary | ICD-10-CM | POA: Diagnosis not present

## 2022-12-30 DIAGNOSIS — Z8719 Personal history of other diseases of the digestive system: Secondary | ICD-10-CM

## 2022-12-30 DIAGNOSIS — L97919 Non-pressure chronic ulcer of unspecified part of right lower leg with unspecified severity: Secondary | ICD-10-CM | POA: Diagnosis not present

## 2022-12-30 DIAGNOSIS — Z79899 Other long term (current) drug therapy: Secondary | ICD-10-CM | POA: Diagnosis not present

## 2022-12-30 DIAGNOSIS — N1831 Chronic kidney disease, stage 3a: Secondary | ICD-10-CM | POA: Diagnosis not present

## 2022-12-30 DIAGNOSIS — I480 Paroxysmal atrial fibrillation: Secondary | ICD-10-CM | POA: Insufficient documentation

## 2022-12-30 DIAGNOSIS — Z7901 Long term (current) use of anticoagulants: Secondary | ICD-10-CM | POA: Diagnosis not present

## 2022-12-30 DIAGNOSIS — Z79890 Hormone replacement therapy (postmenopausal): Secondary | ICD-10-CM | POA: Diagnosis not present

## 2022-12-30 DIAGNOSIS — Z7984 Long term (current) use of oral hypoglycemic drugs: Secondary | ICD-10-CM | POA: Insufficient documentation

## 2022-12-30 DIAGNOSIS — I739 Peripheral vascular disease, unspecified: Secondary | ICD-10-CM

## 2022-12-30 DIAGNOSIS — I6523 Occlusion and stenosis of bilateral carotid arteries: Secondary | ICD-10-CM | POA: Insufficient documentation

## 2022-12-30 DIAGNOSIS — E785 Hyperlipidemia, unspecified: Secondary | ICD-10-CM | POA: Diagnosis not present

## 2022-12-30 DIAGNOSIS — N183 Chronic kidney disease, stage 3 unspecified: Secondary | ICD-10-CM | POA: Diagnosis not present

## 2022-12-30 DIAGNOSIS — E782 Mixed hyperlipidemia: Secondary | ICD-10-CM

## 2022-12-30 DIAGNOSIS — E1151 Type 2 diabetes mellitus with diabetic peripheral angiopathy without gangrene: Secondary | ICD-10-CM | POA: Insufficient documentation

## 2022-12-30 DIAGNOSIS — K7460 Unspecified cirrhosis of liver: Secondary | ICD-10-CM | POA: Insufficient documentation

## 2022-12-30 DIAGNOSIS — I2510 Atherosclerotic heart disease of native coronary artery without angina pectoris: Secondary | ICD-10-CM | POA: Diagnosis not present

## 2022-12-30 DIAGNOSIS — E1122 Type 2 diabetes mellitus with diabetic chronic kidney disease: Secondary | ICD-10-CM | POA: Insufficient documentation

## 2022-12-30 DIAGNOSIS — I6529 Occlusion and stenosis of unspecified carotid artery: Secondary | ICD-10-CM

## 2022-12-30 LAB — COMPREHENSIVE METABOLIC PANEL
ALT: 19 U/L (ref 0–44)
AST: 27 U/L (ref 15–41)
Albumin: 3.2 g/dL — ABNORMAL LOW (ref 3.5–5.0)
Alkaline Phosphatase: 98 U/L (ref 38–126)
Anion gap: 12 (ref 5–15)
BUN: 40 mg/dL — ABNORMAL HIGH (ref 6–20)
CO2: 25 mmol/L (ref 22–32)
Calcium: 8.9 mg/dL (ref 8.9–10.3)
Chloride: 100 mmol/L (ref 98–111)
Creatinine, Ser: 2.01 mg/dL — ABNORMAL HIGH (ref 0.44–1.00)
GFR, Estimated: 28 mL/min — ABNORMAL LOW (ref >60)
Glucose, Bld: 185 mg/dL — ABNORMAL HIGH (ref 70–99)
Potassium: 3.3 mmol/L — ABNORMAL LOW (ref 3.5–5.1)
Sodium: 137 mmol/L (ref 135–145)
Total Bilirubin: <0.1 mg/dL — ABNORMAL LOW (ref 0.3–1.2)
Total Protein: 6.7 g/dL (ref 6.5–8.1)

## 2022-12-30 NOTE — Patient Instructions (Signed)
HOLD evening dose of Torsemide today 12/30/22 and increase fluid intake for the day  Labs today We will only contact you if something comes back abnormal or we need to make some changes. Otherwise no news is good news!  You have been referred to [facility] -they will be in contact with an appointment  Keep echo as scheduled  Your physician wants you to follow-up in: 3-4 months with Dr Kendall Flack will receive a reminder letter in the mail two months in advance. If you don't receive a letter, please call our office to schedule the follow-up appointment.   Do the following things EVERYDAY: Weigh yourself in the morning before breakfast. Write it down and keep it in a log. Take your medicines as prescribed Eat low salt foods--Limit salt (sodium) to 2000 mg per day.  Stay as active as you can everyday Limit all fluids for the day to less than 2 liters  At the [facility], you and your health needs are our priority. As part of our continuing mission to provide you with exceptional heart care, we have created designated Provider Care Teams. These Care Teams include your primary Cardiologist (physician) and Advanced Practice Providers (APPs- Physician Assistants and Nurse Practitioners) who all work together to provide you with the care you need, when you need it.   You may see any of the following providers on your designated Care Team at your next follow up: Dr Glori Bickers Dr Loralie Champagne Dr. Roxana Hires, NP Lyda Jester, [place] [facility] [place], [place] Forestine Na, NP Audry Riles, PharmD   Please be sure to bring in all your medications bottles to every appointment.    Thank you for choosing [facility]

## 2022-12-30 NOTE — Progress Notes (Signed)
Patient ID: [name], female   DOB: 29-Nov-1960, 62 y.o.   MRN: [id number]    Advanced Heart Failure Clinic Note   PCP: [facility] PV: Dr. Gwenlyn Found HF Cardiology: Dr. Aundra Dubin  [name] is a 62 y.o. with history of PAD, carotid stenosis s/p left CEA, relatively mild CAD, chronic systolic CHF, paroxysmal atrial fibrillation and prior substance abuse presents for CHF clinic followup.  She was admitted in 1/17 with acute hypoxemic respiratory failure in the setting of atrial fibrillation/RVR and volume overload.  She was initially intubated.  She converted back to NSR with amiodarone gtt.  She was treated with IV Lasix, steroids, bronchodilators.  She developed AKI and losartan was stopped.    She was admitted in 3/17 with symptomatic bradycardia.  She was supposed to stop diltiazem after this admission but continued to take it.  She presented later in 3/17, again with symptomatic bradycardia (junctional bradycardia), hypotension, and AKI.  Diltiazem and bisoprolol were stopped.  HR recovered and she has had no problems since.  ACEI was stopped with AKI.   In 5/18, she had peripheral angiography with atherectomy of right SFA.  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 7/18 ABIs showed 0.65 on right, 0.31 on left.  In 8/20, she had peripheral angiography showing totally occluded left SFA.  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 Donnetta Hutching.  ABIs in 2/21 were 0.44 on right, 0.99 on left.   Echo in 1/21 showed EF 50%, mild LVH, normal RV, PASP 60 mmHg.   Echo 03/08/21 EF 55-60% with basal to mid inferolateral hypokinesis.   Admitted 6/22 for mechanical fall, found to have AKI on CKD. She was given IV lasix and her losartan was held.  Underwent PV angiogram with Dr. Gwenlyn Found 7/22. She had drug-coated balloon angioplasty of the right SFA after being admitted for critical ischemia of the right foot.  Repeat peripheral  arterial dopplers in 8/22 still showed severe right SFA disease.   Admitted 8/22 for bradycardia and AKI, bisoprolol subsequently stopped; amiodarone was continued.   Follow up 1/24, she was volume overloaded and instructed to take metolazone 2.5 mg x 1 and increase torsemide to 100 bid.  Today she returns for HF follow up. Overall feeling fair. Has had diarrhea/vomiting x 2 days, no fever or chills. She is not SOB with ADLs using her walker, does not walk around the house much due to foot neuropathy. Uses WC when she is out of her house. Weight is down 14 lbs. Has a new RLE ulcer. Denies palpitations, CP, dizziness, edema, or PND/Orthopnea. Appetite ok. No fever or chills. Taking all medications.     Cardiomems: PADP 14, goal 20  ECG (personally reviewed): none ordered today.  Labs (1/17): K 5.2, creatinine 1.4, AST 43, ALT normal Labs (2/17): K 4.2, creatinine 1.3, BNP 607, TSH normal Labs (3/17): K 4.2, creatinine 1.45, AST/ALT normal, HCT 28.7 Labs (4/17): K 4.4, creatinine 1.61 Labs (08/31/2016): K 4.5 Creatinine 1.43  Labs (11/17): K 4.2, creatinine 1.4 Labs (9/18): K 4.1, creatinine 1.1, TSH normal Labs 04/13/2018: K 5 Creatinine 1.75 Labs (2/21): K 4.1, creatinine 1.58 Labs (4/22): K 3.7, creatinine 1.55 Labs (5/22): K 3.8, creatinine 1.54 Labs (8/22): K 4.0, creatinine 2.07 Labs (11/22): TSH normal, LDL 59 Labs (12/22): K 3.6, creatinine 1.28, BNP 256, AST/ALT normal Labs (3/23): K 4.3, creatinine 1.82 => 1.7, hgb 11 Labs (5/22) K  3.6, creatinine 2.2, TG 405  Labs (6/22) K 3.6, creatinine 1.34  Labs (8/23): K 4.7, creatinine 2.11, LDL 93, TGs 315 Labs (10/23): K 3.4, creatinine 1.35 Labs (1/24): K 4.0, creatinine 3.58  PMH: 1. Carotid stenosis: Known occluded right carotid.  Left CEA in 4/16.  - Carotid dopplers (7/21): CTO RICA, mild disease LICA.  - Carotid dopplers (7/22): CTO RICA, [id number] LICA.  - Carotid dopplers (3/23): Totally occluded RICA, [id number] LICA.  2. CAD:  LHC in 12/15 with 80% stenosis in small OM1, nonobstructive disease in other territories.  3. Chronic systolic CHF: Nonischemic cardiomyopathy (?due to ETOH or prior drug abuse).  She has a Cardiomems.  - Echo (1/17) with EF 45%, mild LV hypertrophy, moderate diastolic dysfunction, inferolateral severe hypokinesis, mildly decreased RV systolic function.  - Echo (1/18): EF 40-45%, mild LVH, normal RV size and systolic function.  - Echo (1/21): EF 50%, mild LVH, normal RV, PASP 60 mmHg.  - Echo (4/22): EF 55-60%, basal-mid inferolateral hypokinesis with grade II diastolic dysfunction, RV normal, PASP 42 4. Atrial fibrillation: Paroxysmal.   5. Type II diabetes 6. CKD: Stage III. 7. COPD: Smokes 1 ppd.  8. 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  No longer drinks.  9. Hypothyroidism 10. PAD: Atherectomy SFA in 2014 (Dr Gwenlyn Found).  Peripheral arterial dopplers (2/17) with focal 75-99% proximal right SFA stenosis, occluded mid-distal right SFA, chronic occlusion of all runoff arteries on right. - In 5/18, she had peripheral angiography with atherectomy of right SFA.   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  - 7/18 ABIs showed 0.65 on right, 0.31 on left.   - Peripheral angiography 8/20: Totally occluded left SFA.  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 Donnetta Hutching.   - ABIs (2/21): 0.44 R, 0.99 L.  - Balloon angioplasty right SFA 7/22.  - ABIs (8/22): severe right SFA disease.  - peripheral arterial dopplers (2/23): 50-74% right SFA and right popliteal. Stable.  11. Anemia 12. Prior cocaine abuse.  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  14. OSA: Has not been able to tolerate CPAP.  15. Diabetic neuropathy.   SH: Lives with sister.  Prior cocaine abuse.  Prior ETOH abuse.  Quit smoking in 1/17, restarted, and quit again in 4/19, restarted again.  FH: CAD  Review of systems complete and  found to be negative unless listed in HPI.    Current Outpatient Medications  Medication Sig Dispense Refill   allopurinol (ZYLOPRIM) 100 MG tablet TAKE 1 TABLET (100 MG TOTAL) BY MOUTH DAILY(AM) 30 tablet 0   amiodarone (PACERONE) 200 MG tablet TAKE 1/2 TABLET (100 MG TOTAL) BY MOUTH DAILY (AM) 15 tablet 3   amLODipine (NORVASC) 10 MG tablet Take 1 tablet (10 mg total) by mouth every morning. 90 tablet 1   Ascorbic Acid (VITAMIN C PO) Take 1 tablet by mouth every morning.     Aspirin-Salicylamide-Caffeine (BC HEADACHE POWDER PO) Take 1 packet by mouth 4 (four) times daily as needed (pain/headache).     budesonide-formoterol (SYMBICORT) 160-4.5 MCG/ACT inhaler Inhale 2 puffs into the lungs 2 (two) times daily. 1 each 2   buPROPion (WELLBUTRIN SR) 150 MG 12 hr tablet Take 1 tablet (150 mg total) by mouth 2 (two) times daily. 180 tablet 0   Cholecalciferol (VITAMIN D3 PO) Take 1 capsule by mouth every morning.     colchicine 0.6 MG tablet TAKE  2 TABS (1.2 MG) AT THE ONSET OF A GOUT FLARE, MAY TAKE 1 TAB (0.6 MG) IN 1 HOUR IF SYMPTOMS 30 tablet 3   Continuous Blood Gluc Sensor (FREESTYLE LIBRE 2 SENSOR) MISC USE 1 (ONE) EACH EVERY 2 WEEKS 2 each 11   Dulaglutide (TRULICITY) A999333 0000000 SOPN Inject 0.75 mg into the skin every Friday. 2 mL 3   DULoxetine (CYMBALTA) 60 MG capsule Take 1 capsule (60 mg total) by mouth daily. For diabetic neuropathy 30 capsule 3   EASY COMFORT PEN NEEDLES 31G X 5 MM MISC USE FOUR TIMES A DAY FOR INSULIN ADMINISTRATION 100 each 1   ELIQUIS 5 MG TABS tablet TAKE 1 TABLET (5 MG TOTAL) BY MOUTH 2 (TWO) TIMES DAILY (AM+BEDTIME) 60 tablet 8   empagliflozin (JARDIANCE) 10 MG TABS tablet Take 1 tablet (10 mg total) by mouth daily. TAKE 1 TABLET before breakfast daily 90 tablet 3   FEROSUL 325 (65 Fe) MG tablet TAKE 1 TABLET (324 MG TOTAL) BY MOUTH 2 (TWO) TIMES DAILY AS NEEDED. (AM+BEDTIME) 60 tablet 3   folic acid (FOLVITE) 1 MG tablet TAKE 1 TABLET (1 MG TOTAL) BY MOUTH  DAILY (AM) 90 tablet 1   hydrALAZINE (APRESOLINE) 50 MG tablet TAKE 1 & 1/2 TABLETS (75 MG TOTAL) BY MOUTH 3 (THREE) TIMES DAILY (AM+NOON+BEDTIME) 140 tablet 4   ibuprofen (ADVIL) 600 MG tablet Take 1 tablet by mouth as needed.     icosapent Ethyl (VASCEPA) 1 g capsule Take 2 capsules (2 g total) by mouth 2 (two) times daily. 120 capsule 6   insulin lispro (HUMALOG) 100 UNIT/ML KwikPen Inject 10 Units into the skin 3 (three) times daily with meals. 15 mL 3   LANTUS SOLOSTAR 100 UNIT/ML Solostar Pen Inject 37 Units into the skin daily. (Patient taking differently: Inject 37 Units into the skin at bedtime.) 15 mL 3   levothyroxine (SYNTHROID) 50 MCG tablet TAKE 1 TABLET (50 MCG TOTAL) BY MOUTH DAILY BEFORE BREAKFAST (AM) 90 tablet 1   losartan (COZAAR) 25 MG tablet TAKE 2 TABLETS (50 MG TOTAL) BY MOUTH DAILY (AM) 180 tablet 0   Omega-3 Fatty Acids (FISH OIL PO) Take 1 capsule by mouth every morning.     ondansetron (ZOFRAN) 4 MG tablet Take 1 tablet (4 mg total) by mouth every 8 (eight) hours as needed for nausea or vomiting. 20 tablet 0   pantoprazole (PROTONIX) 40 MG tablet TAKE 1 TABLET (40 MG TOTAL) BY MOUTH DAILY(AM) 30 tablet 0   potassium chloride SA (KLOR-CON M) 20 MEQ tablet Take 1 tablet (20 mEq total) by mouth 2 (two) times daily. 60 tablet 4   rosuvastatin (CRESTOR) 40 MG tablet Take 1 tablet (40 mg total) by mouth daily. 90 tablet 3   Semaglutide,0.25 or 0.5MG/DOS, (OZEMPIC, 0.25 OR 0.5 MG/DOSE,) 2 MG/1.5ML SOPN Inject 0.5 mg into the skin once a week. 2 mL 6   spironolactone (ALDACTONE) 25 MG tablet TAKE 0.5 TABLET (12.5 MG TOTAL) BY MOUTH DAILY (AM) 45 tablet 3   torsemide (DEMADEX) 100 MG tablet TAKE 1 TABLET (100 MG TOTAL) BY MOUTH 2 (TWO) TIMES DAILY. (AM+BEDTIME) 60 tablet 3   triamcinolone 0.1% oint-Cerave equivalent lotion 1:1 mixture Apply topically 2 (two) times daily as needed. 454 g 1   ACCU-CHEK GUIDE test strip USE TO CHECK BLOOD SUGAR FOUR TIMES DAILY     acetaminophen  (TYLENOL) 500 MG tablet Take 1,000 mg by mouth every 6 (six) hours as needed for headache (pain).  albuterol (VENTOLIN HFA) 108 (90 Base) MCG/ACT inhaler USE 2 PUFFS BY MOUTH EVERY FOUR HOURS, AS NEEDED, FOR COUGHING/WHEEZING 8.5 g 3   No current facility-administered medications for this encounter.   BP 110/70   Pulse 95   Wt 94.8 kg (209 lb)   LMP  (LMP Unknown)   SpO2 95%   BMI 35.87 kg/m   Wt Readings from Last 3 Encounters:  12/30/22 94.8 kg (209 lb)  11/29/22 101.2 kg (223 lb 3.2 oz)  11/24/22 102.1 kg (225 lb)   Physical Exam General:  NAD. No resp difficulty, arrived in [facility], chronically-ill appearing. HEENT: Normal Neck: Supple. No JVD. Carotids 2+ bilat; no bruits. No lymphadenopathy or thryomegaly appreciated. Cor: PMI nondisplaced. Regular rate & rhythm. No rubs, gallops or murmurs. Lungs: Clear Abdomen: Soft, nontender, nondistended. No hepatosplenomegaly. No bruits or masses. Good bowel sounds. Extremities: No cyanosis, clubbing, rash, trace RLE edema w/ venous stasis changes, + ulcer on R TMA site Neuro: Alert & oriented x 3, cranial nerves grossly intact. Moves all 4 extremities w/o difficulty. Affect pleasant.  Assessment/Plan: 1. Chronic diastolic CHF: LHC in [id number] showing only 80% stenosis in small OM1. EF as low as 40-45% in the past. Echo 4/22 showed EF 55-60% with grade 2 diastolic dysfunction.   Not very active, NYHA class III symptoms. She is not volume overloaded by exam, Cardiomems suggests she is hypovolemic, weight down 14 lbs.  - With recent GI illness and weight down 14 lbs, hold evening dose of torsemide and increase Po intake today. - Tomorrow, ok to resume torsemide 100 mg bid. BMET today. - Continue spiro 12.5 mg daily  - Continue losartan 50 mg daily.  - Continue Jardiance 10 mg daily. No GU symptoms. - Off bisoprolol due to bradycardia.   - Repeat echo arranged for 01/12/23 2. Atrial fibrillation: Paroxysmal. Regular on exam today.  - Continue  amiodarone 100 mg daily. Check LFTs.  She is on levothyroxine, followed by PCP.  Needs regular eye exam.  - Continue Eliquis 5 mg bid. No bleeding issues. 3. PAD: Claudication bilaterally.  Not candidate for cilostazol with CHF.  She has had a left fem-pop bypass in 8/20.  ABIs in 2/21 were very abnormal on right. She underwent PV angiogram with balloon angioplasty of the right SFA 7/22.  8/22 dopplers still showed severe right SFA disease. Dopplers 2/23 showed patent right SFA.  Peripheral arterial dopplers in 2/23 showed 50-74% stenosis right SFA and popliteal.  She has no definite claudication, has knee pain and neuropathic pain in feet. Now with new ulcer on RLE. She is due for repeat ABIs. She is followed by Dr. Gwenlyn Found. - Refer to [facility], she has follow up with Podiatry next month. - Continue atorvastatin, good lipids 1/24. 4. COPD:  She has stayed off cigarettes.   5. H/o cirrhosis: Likely from ETOH, she has quit.  6. OSA: Not able to tolerate CPAP.   7. CKD stage III:  Baseline SCr ~1.5 - Follow BMET closely, this may limit diuresis.  8. HTN: Controlled.  No change to BP-active medications. 9.Type 2 diabetes: Per PCP.   10. CAD: LHC in 12/15 with 80% stenosis in small OM1, nonobstructive disease in other territories. No chest pain. - Continue Crestor, recent LDL 46. Goal LDL < 55.  - Continue Eliquis. 11. Carotid stenosis: Stable in 3/23.   12. Hyperlipidemia: She is on Crestor and Vascepa.  - LDL 46 (1/24)  Follow up in 3-4 months with Dr. Aundra Dubin.  [place], [place] 12/30/2022

## 2023-01-01 IMAGING — DX DG CHEST 1V PORT
1 series · 1 of 1 positions shown · non-contrast
Comparison: None Available.

CLINICAL DATA: Cough and shortness of breath.

EXAM:
PORTABLE CHEST 1 VIEW

[chest]
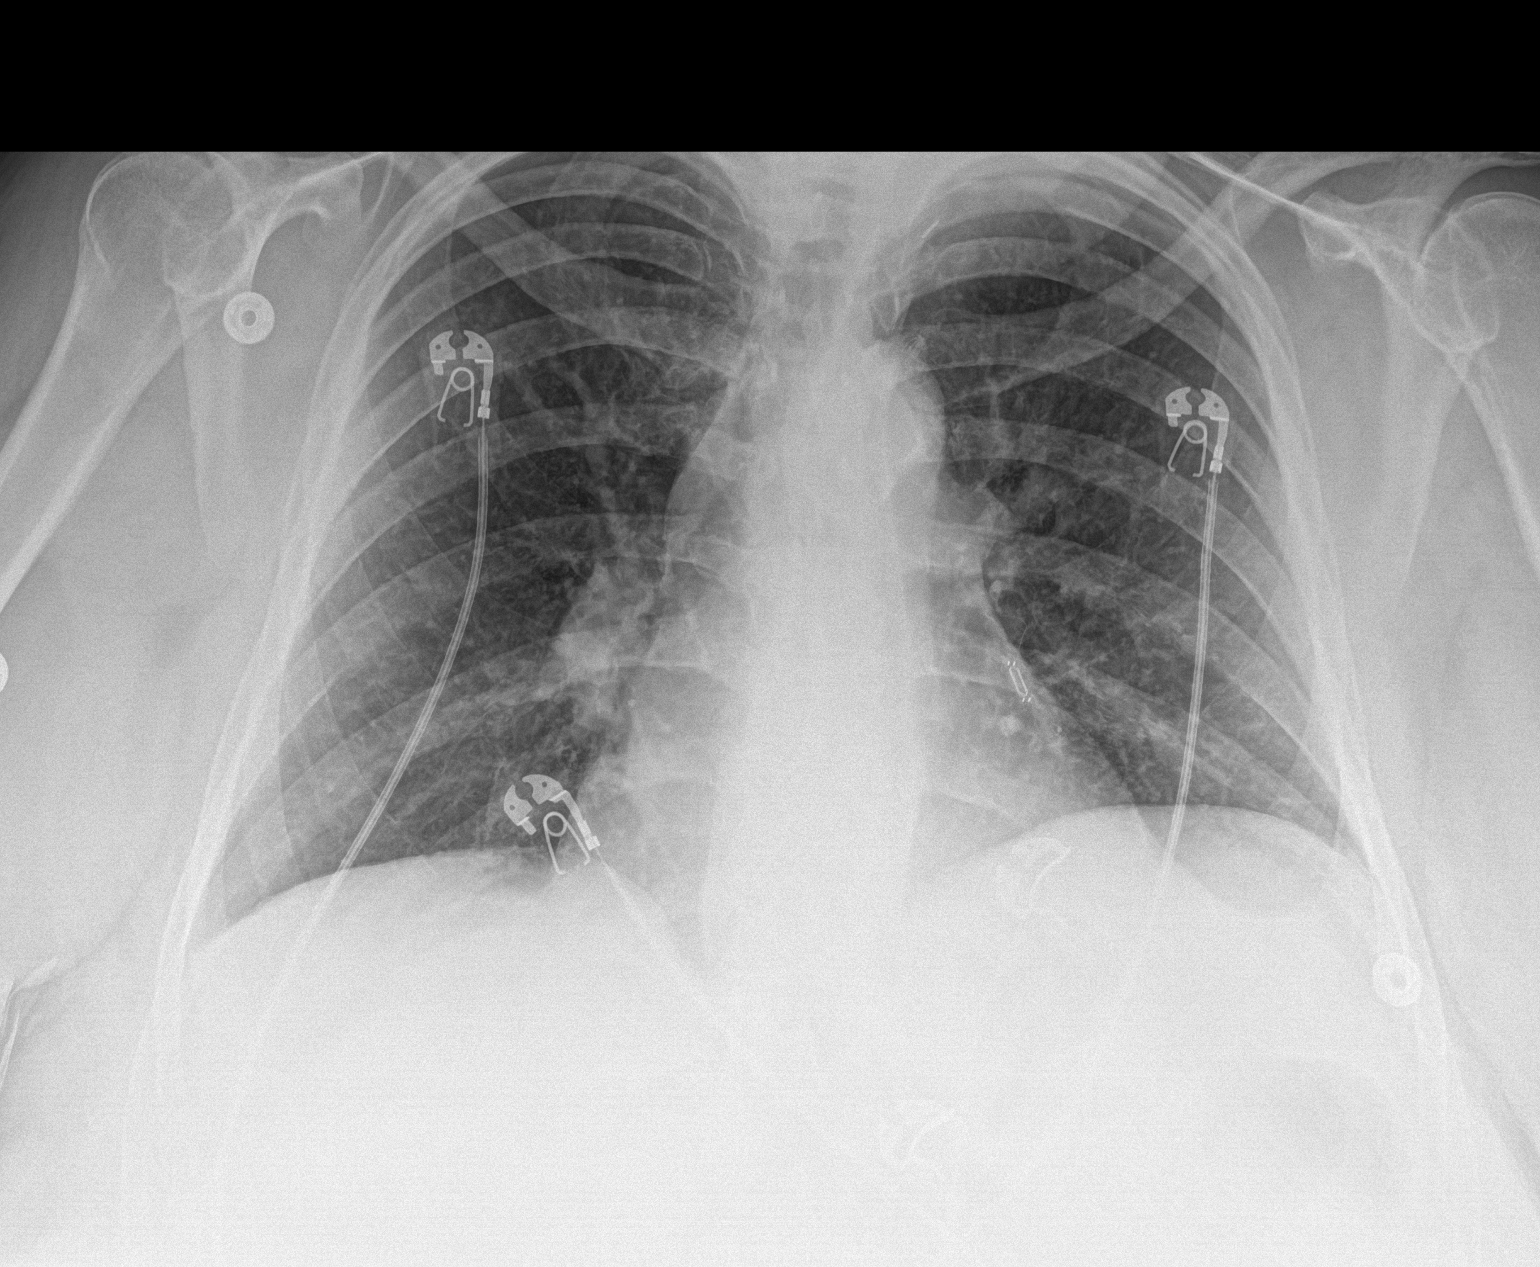

[1 of 1 positions shown; findings below may reference images not displayed]

FINDINGS: The heart size and mediastinal contours are within normal limits. A
CardioMEMS device is seen projecting over the left hilum. Both lungs
are clear. The visualized skeletal structures are unremarkable.
IMPRESSION: No active disease.

## 2023-01-01 IMAGING — CT CT ABD-PELV W/ CM
2 of 6 series · 17 of 46 positions shown, 19 images · IV contrast (APPLIED)
Comparison: None Available.

CLINICAL DATA: Left lower quadrant pain.

EXAM:
CT ABDOMEN AND PELVIS WITH CONTRAST
TECHNIQUE: Multidetector CT imaging of the abdomen and pelvis was performed
using the standard protocol following bolus administration of
intravenous contrast.

[Series 3: abdomen 5.0 · axial · 0.98mm/px · z∈[+146,+546]mm · 14 of 94 slices shown, 16 images]
[im 7/94  soft-tissue]
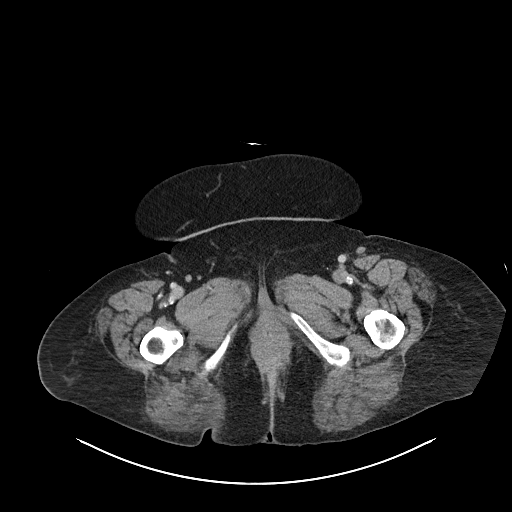
[im 7/94  bone]
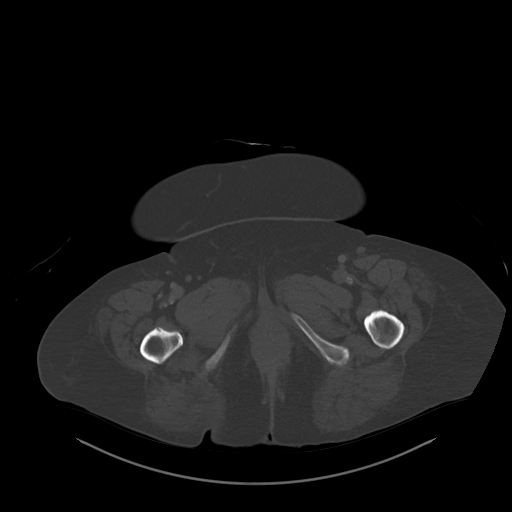
[im 13/94  soft-tissue]
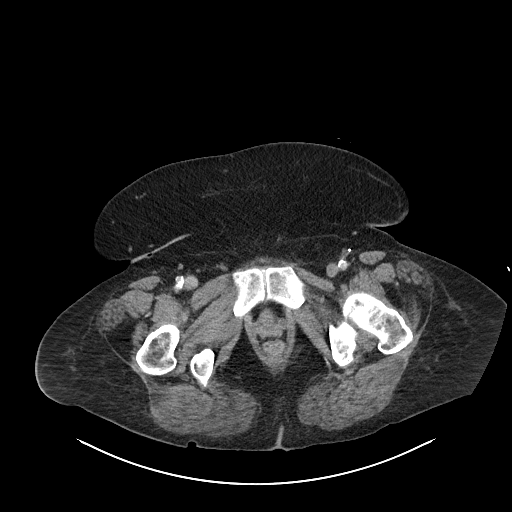
[im 19/94  soft-tissue]
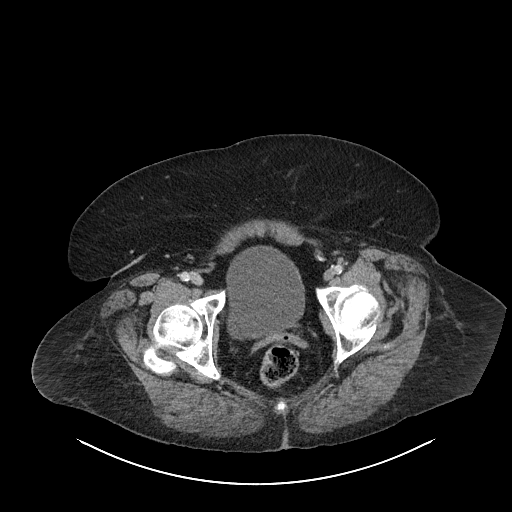
[im 25/94  soft-tissue]
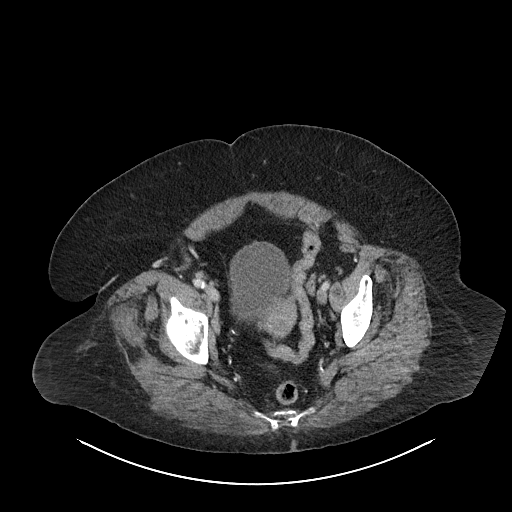
[im 32/94  soft-tissue]
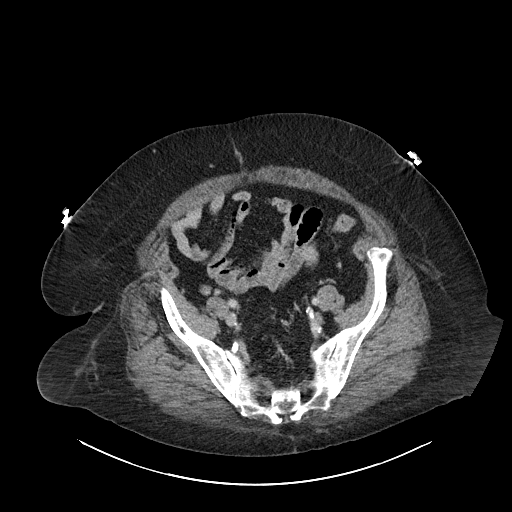
[im 38/94  soft-tissue]
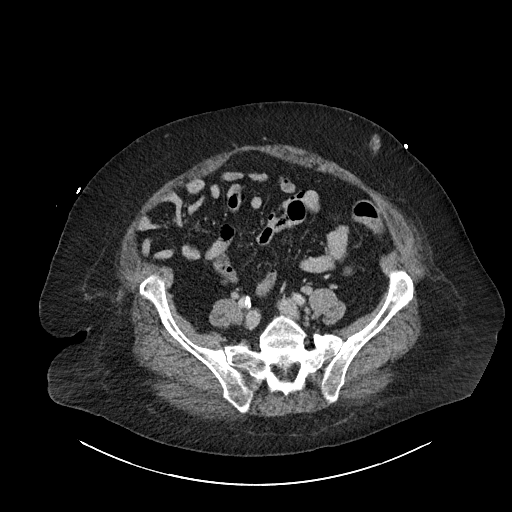
[im 44/94  soft-tissue]
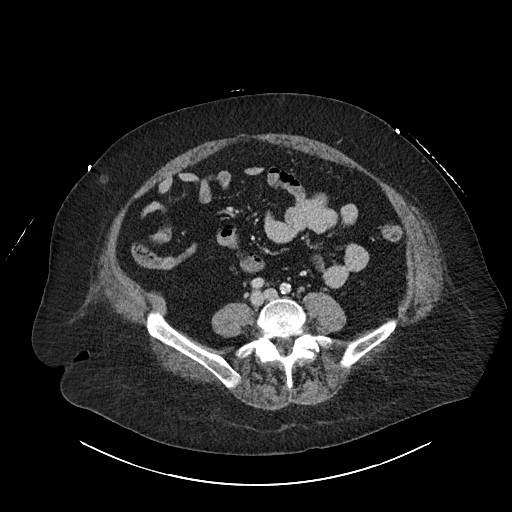
[im 50/94  soft-tissue]
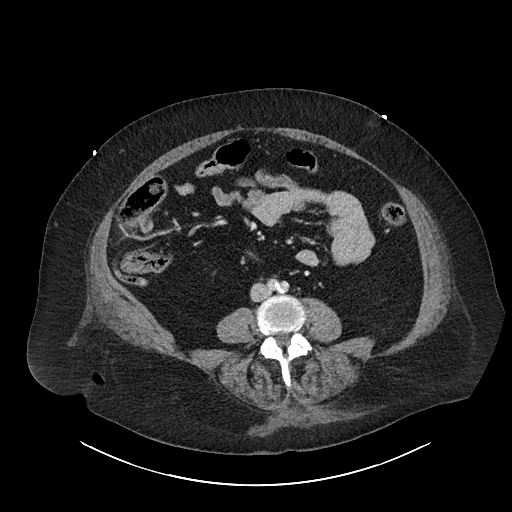
[im 56/94  soft-tissue]
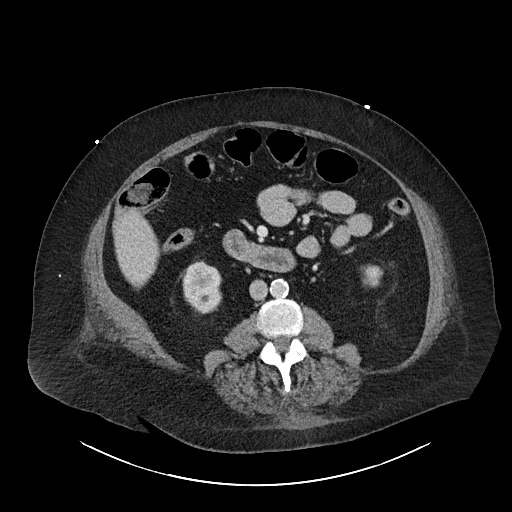
[im 56/94  bone]
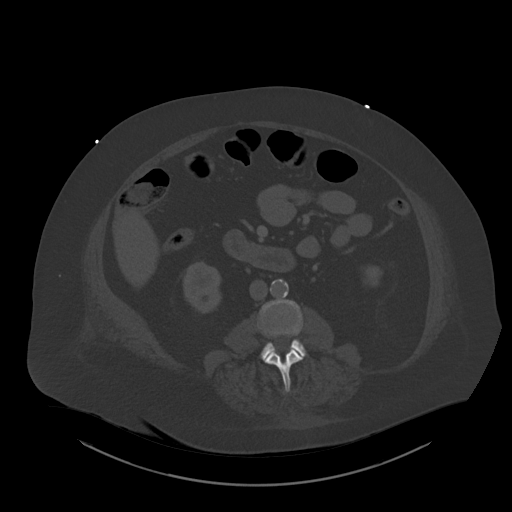
[im 63/94  soft-tissue]
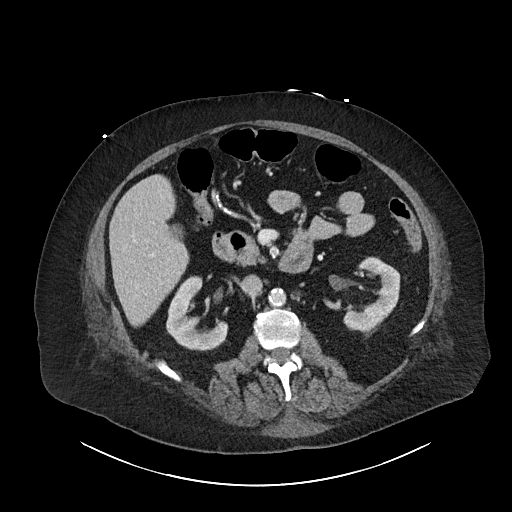
[im 69/94  soft-tissue]
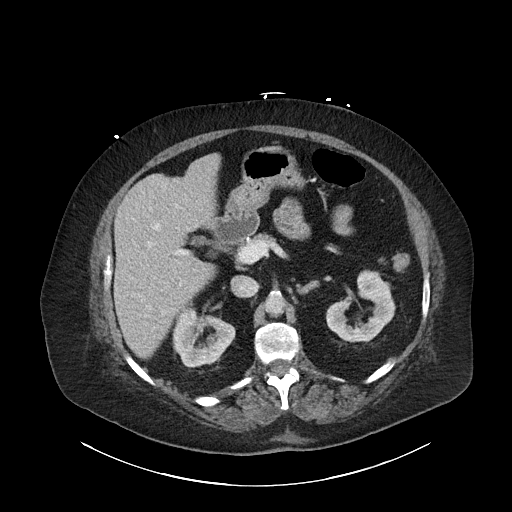
[im 75/94  soft-tissue]
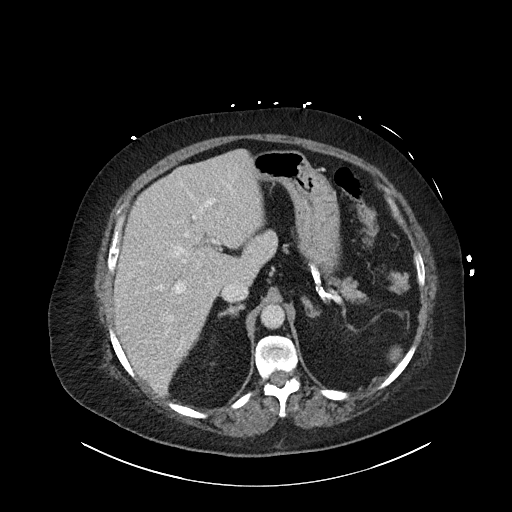
[im 81/94  soft-tissue]
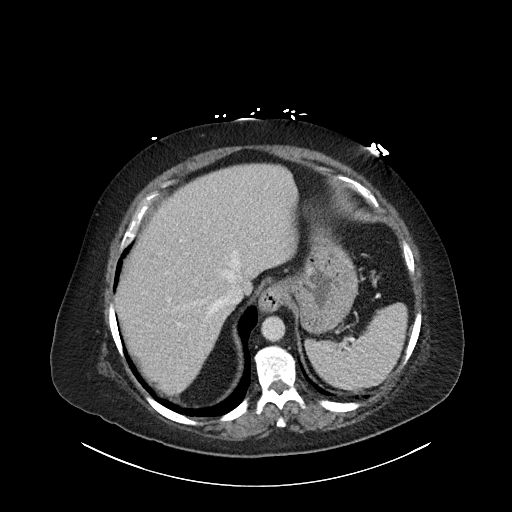
[im 87/94  soft-tissue]
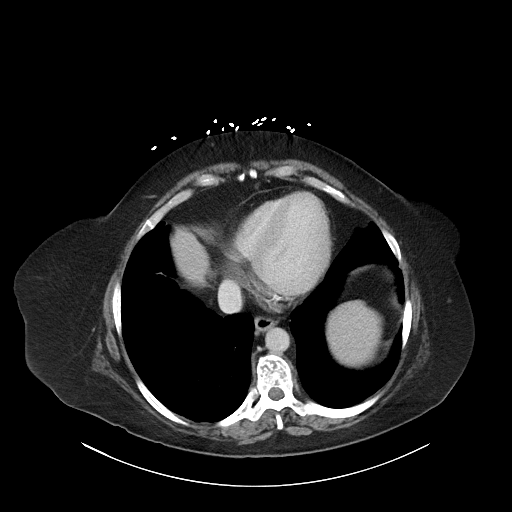

[Series 8: abdomen 3.0 mpr cor · coronal · 0.95mm/px · 3 of 122 slices shown]
[im 41/122  soft-tissue]
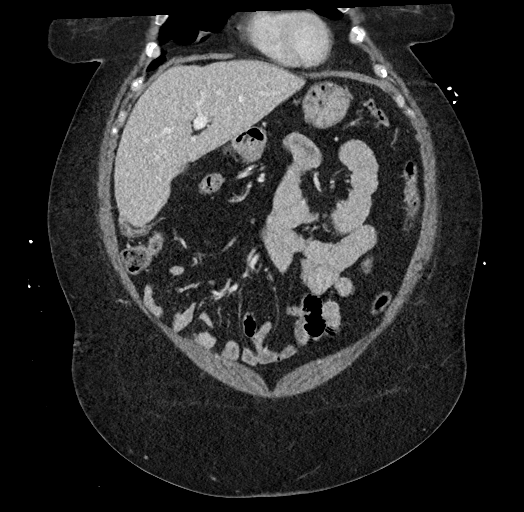
[im 54/122  soft-tissue]
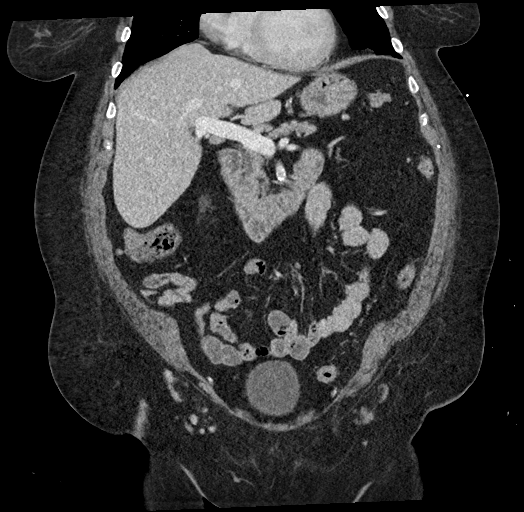
[im 68/122  soft-tissue]
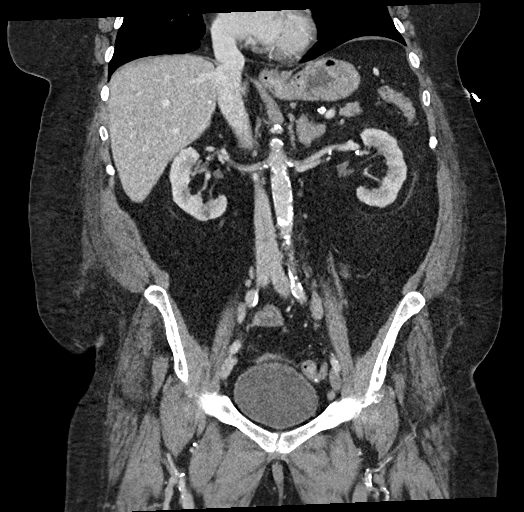

[17 of 46 positions shown; findings below may reference images not displayed]

RADIATION DOSE REDUCTION: This exam was performed according to the
departmental dose-optimization program which includes automated
exposure control, adjustment of the mA and/or kV according to
patient size and/or use of iterative reconstruction technique.

CONTRAST:  80mL OMNIPAQUE IOHEXOL 300 MG/ML  SOLN
FINDINGS: Lower chest: No acute abnormality.

Hepatobiliary: No focal liver abnormality is seen. No gallstones,
gallbladder wall thickening, or biliary dilatation.

Pancreas: Unremarkable. No pancreatic ductal dilatation or
surrounding inflammatory changes.

Spleen: Normal in size without focal abnormality.

Adrenals/Urinary Tract: Adrenal glands are unremarkable. Kidneys are
normal in size, without renal calculi or hydronephrosis. A 2.0 cm
fat density lesion is seen within the anterior aspect of the mid to
lower right kidney (approximately -70.25 Hounsfield units). Bladder
is unremarkable.

Stomach/Bowel: Stomach is within normal limits. Appendix appears
normal. No evidence of bowel wall thickening, distention, or
inflammatory changes.

Vascular/Lymphatic: Aortic atherosclerosis. No enlarged abdominal or
pelvic lymph nodes.

Reproductive: Uterus and bilateral adnexa are unremarkable.

Other: No abdominal wall hernia or abnormality. No abdominopelvic
ascites.

Musculoskeletal: No acute or significant osseous findings.
IMPRESSION: 1. No acute or active process within the abdomen or pelvis.
[DATE] cm right renal angiomyolipoma.
3. Aortic atherosclerosis.

Aortic Atherosclerosis ([id number]).

## 2023-01-03 ENCOUNTER — Other Ambulatory Visit (HOSPITAL_COMMUNITY): Payer: Self-pay

## 2023-01-03 ENCOUNTER — Telehealth (HOSPITAL_COMMUNITY): Payer: Self-pay

## 2023-01-03 DIAGNOSIS — I5032 Chronic diastolic (congestive) heart failure: Secondary | ICD-10-CM

## 2023-01-03 NOTE — Telephone Encounter (Signed)
Patient aware and agreeable and verbalized understanding. Labs ordered and scheduled

## 2023-01-04 IMAGING — CR DG SHOULDER 2+V*R*
3 series · 3 of 3 positions shown · non-contrast
Comparison: 09/12/2021

CLINICAL DATA: Right shoulder pain following fall, initial
encounter

EXAM:
RIGHT SHOULDER - 2+ VIEW

[shoulder grashey]
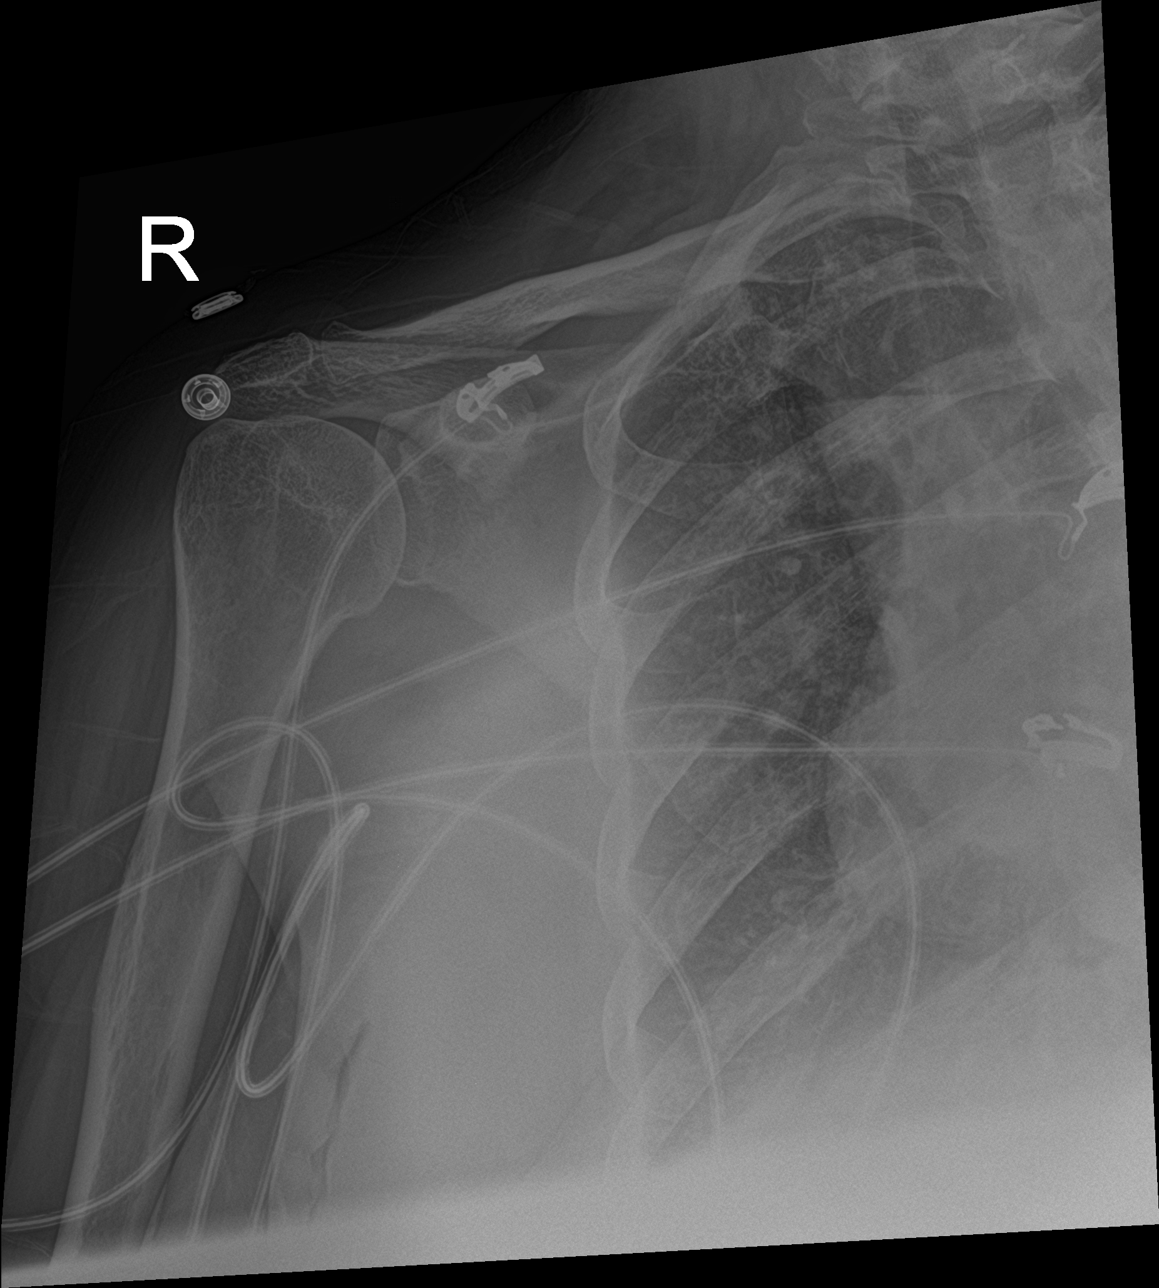

[shoulder y view]
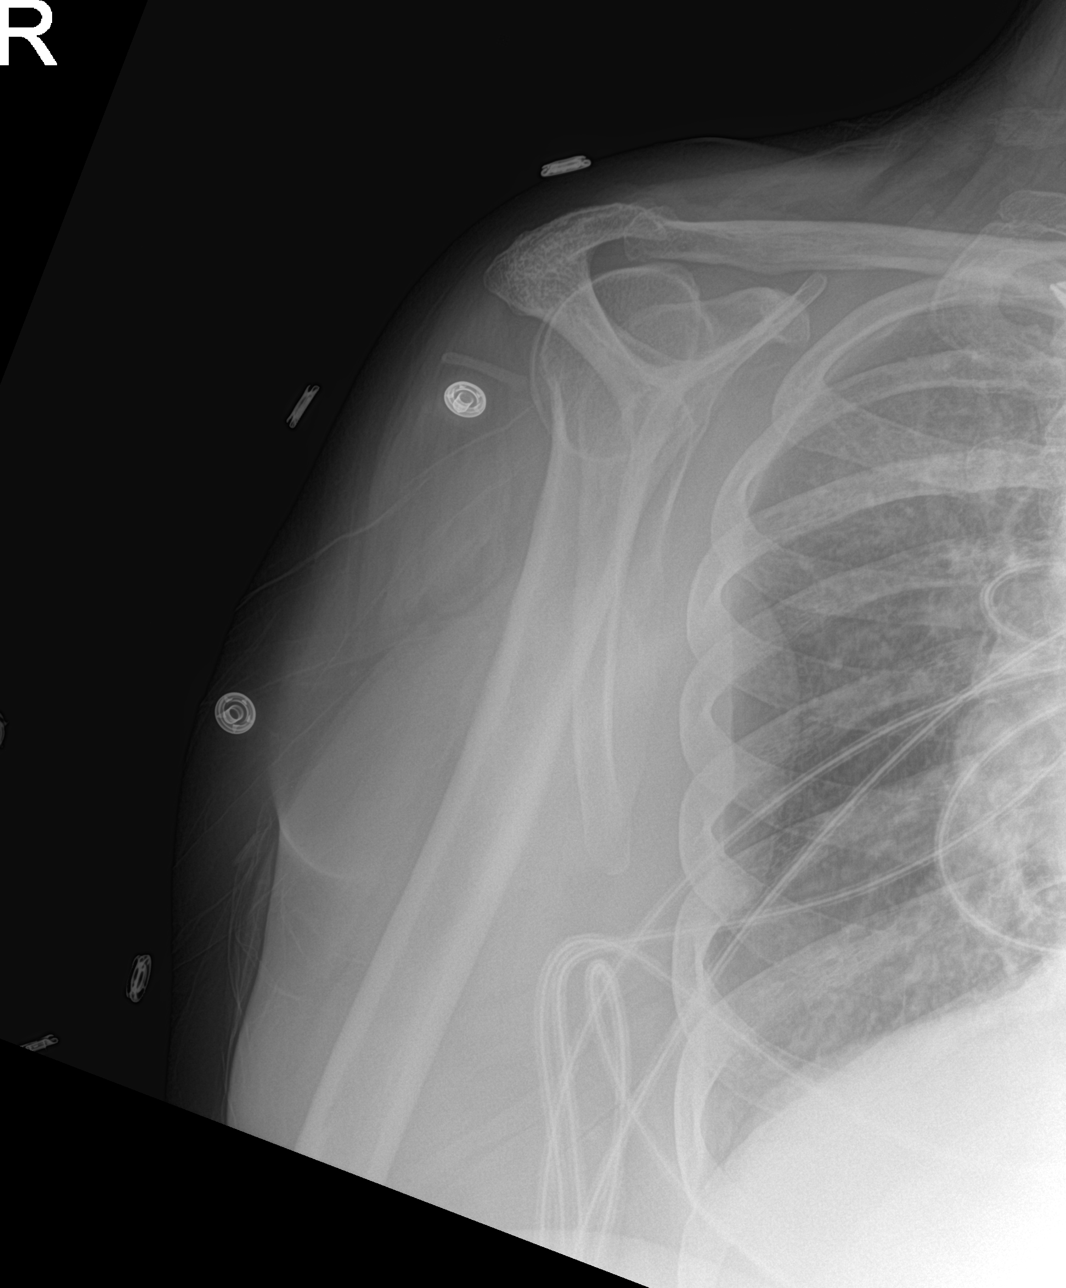

[shoulder ap neutral]
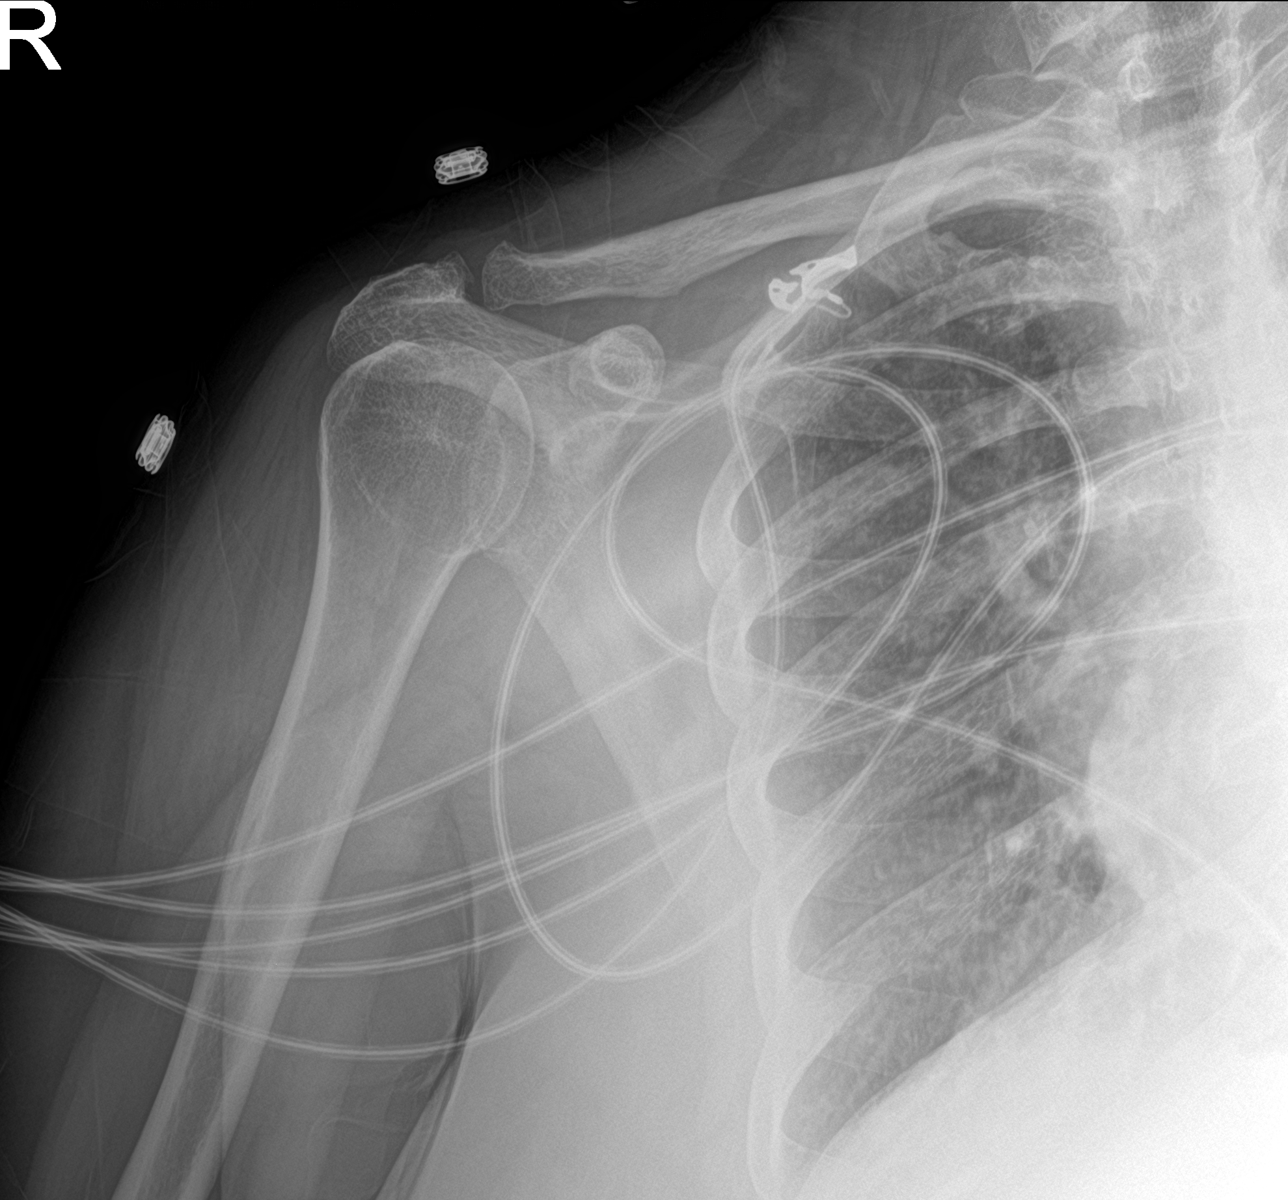

[3 of 3 positions shown; findings below may reference images not displayed]

FINDINGS: Mild degenerative changes of the acromioclavicular joint are seen.
No acute fracture or dislocation is noted. Underlying bony thorax is
within normal limits. No soft tissue changes are seen.
IMPRESSION: No acute abnormality noted.

## 2023-01-05 ENCOUNTER — Ambulatory Visit: Payer: Medicare Other | Admitting: Family Medicine

## 2023-01-10 ENCOUNTER — Ambulatory Visit: Payer: Self-pay

## 2023-01-10 NOTE — Patient Outreach (Signed)
  Care Coordination   01/10/2023 Name: [name] MRN: [id number] DOB: July 15, 1961   Care Coordination Outreach Attempts:  An unsuccessful telephone outreach was attempted for a scheduled appointment today.  Follow Up Plan:  Additional outreach attempts will be made to offer the patient care coordination information and services.   Encounter Outcome:  No Answer   Care Coordination Interventions:  No, not indicated    Barb Merino, RN, BSN, CCM Care Management Coordinator [facility] Care Management  Direct Phone: [phone]

## 2023-01-11 ENCOUNTER — Other Ambulatory Visit: Payer: Self-pay | Admitting: Family Medicine

## 2023-01-11 ENCOUNTER — Ambulatory Visit (HOSPITAL_COMMUNITY)
Admission: RE | Admit: 2023-01-11 | Discharge: 2023-01-11 | Disposition: A | Discharge status: Home / Self Care | Payer: 59 | Source: Ambulatory Visit | Attending: Cardiovascular Disease | Admitting: Cardiovascular Disease

## 2023-01-11 DIAGNOSIS — Z9862 Peripheral vascular angioplasty status: Secondary | ICD-10-CM | POA: Insufficient documentation

## 2023-01-11 DIAGNOSIS — M1A0410 Idiopathic chronic gout, right hand, without tophus (tophi): Secondary | ICD-10-CM

## 2023-01-11 DIAGNOSIS — I739 Peripheral vascular disease, unspecified: Secondary | ICD-10-CM

## 2023-01-11 LAB — VAS US ABI WITH/WO TBI
Left ABI: 1.04
Right ABI: 0.57

## 2023-01-12 ENCOUNTER — Ambulatory Visit (HOSPITAL_COMMUNITY): Admission: RE | Admit: 2023-01-12 | Payer: 59 | Source: Ambulatory Visit

## 2023-01-16 ENCOUNTER — Other Ambulatory Visit (HOSPITAL_COMMUNITY): Payer: 59

## 2023-01-17 ENCOUNTER — Telehealth: Payer: Self-pay | Admitting: Cardiovascular Disease

## 2023-01-17 NOTE — Telephone Encounter (Signed)
Left message to call back  

## 2023-01-17 NOTE — Telephone Encounter (Signed)
Patients states she was returning a call. Please advise

## 2023-01-18 ENCOUNTER — Telehealth: Payer: Self-pay | Admitting: *Deleted

## 2023-01-18 NOTE — Progress Notes (Signed)
  Care Coordination Note  01/18/2023 Name: YARESLY KIMMELMAN MRN: [id number] DOB: [birth year]  SEBRENA LANGELL is a 62 y.o. year old female who is a primary care patient of Charlott Rakes, MD and is actively engaged with the care management team. I reached out to [name] by phone today to assist with re-scheduling an initial visit with the RN Case Manager  Follow up plan: Telephone appointment with care management team member scheduled for:02/08/23  [place]  Direct Dial: [phone]

## 2023-01-23 ENCOUNTER — Emergency Department (HOSPITAL_COMMUNITY)
Admission: EM | Admit: 2023-01-23 | Discharge: 2023-01-24 | Disposition: A | Discharge status: Home / Self Care | Payer: 59 | Attending: Emergency Medicine | Admitting: Emergency Medicine

## 2023-01-23 ENCOUNTER — Ambulatory Visit: Payer: Medicare Other | Admitting: Physician Assistant

## 2023-01-23 ENCOUNTER — Other Ambulatory Visit: Payer: Self-pay

## 2023-01-23 ENCOUNTER — Ambulatory Visit: Payer: Self-pay | Admitting: *Deleted

## 2023-01-23 ENCOUNTER — Encounter (HOSPITAL_COMMUNITY): Payer: Self-pay

## 2023-01-23 ENCOUNTER — Emergency Department (HOSPITAL_COMMUNITY): Payer: 59

## 2023-01-23 DIAGNOSIS — M7989 Other specified soft tissue disorders: Secondary | ICD-10-CM | POA: Diagnosis not present

## 2023-01-23 DIAGNOSIS — E876 Hypokalemia: Secondary | ICD-10-CM | POA: Diagnosis not present

## 2023-01-23 DIAGNOSIS — Z794 Long term (current) use of insulin: Secondary | ICD-10-CM | POA: Diagnosis not present

## 2023-01-23 DIAGNOSIS — Z7901 Long term (current) use of anticoagulants: Secondary | ICD-10-CM | POA: Diagnosis not present

## 2023-01-23 DIAGNOSIS — R600 Localized edema: Secondary | ICD-10-CM

## 2023-01-23 DIAGNOSIS — Z7982 Long term (current) use of aspirin: Secondary | ICD-10-CM | POA: Diagnosis not present

## 2023-01-23 DIAGNOSIS — I509 Heart failure, unspecified: Secondary | ICD-10-CM | POA: Insufficient documentation

## 2023-01-23 DIAGNOSIS — M79605 Pain in left leg: Secondary | ICD-10-CM | POA: Diagnosis not present

## 2023-01-23 DIAGNOSIS — I739 Peripheral vascular disease, unspecified: Secondary | ICD-10-CM

## 2023-01-23 DIAGNOSIS — L539 Erythematous condition, unspecified: Secondary | ICD-10-CM | POA: Diagnosis not present

## 2023-01-23 DIAGNOSIS — R609 Edema, unspecified: Secondary | ICD-10-CM | POA: Diagnosis not present

## 2023-01-23 DIAGNOSIS — M79604 Pain in right leg: Secondary | ICD-10-CM | POA: Diagnosis present

## 2023-01-23 LAB — CBC WITH DIFFERENTIAL/PLATELET
Abs Immature Granulocytes: 0.08 10*3/uL — ABNORMAL HIGH (ref 0.00–0.07)
Basophils Absolute: 0 10*3/uL (ref 0.0–0.1)
Basophils Relative: 0 %
Eosinophils Absolute: 0.4 10*3/uL (ref 0.0–0.5)
Eosinophils Relative: 4 %
HCT: 32 % — ABNORMAL LOW (ref 36.0–46.0)
Hemoglobin: 10.1 g/dL — ABNORMAL LOW (ref 12.0–15.0)
Immature Granulocytes: 1 %
Lymphocytes Relative: 11 %
Lymphs Abs: 1.2 10*3/uL (ref 0.7–4.0)
MCH: 30.1 pg (ref 26.0–34.0)
MCHC: 31.6 g/dL (ref 30.0–36.0)
MCV: 95.5 fL (ref 80.0–100.0)
Monocytes Absolute: 0.9 10*3/uL (ref 0.1–1.0)
Monocytes Relative: 8 %
Neutro Abs: 8 10*3/uL — ABNORMAL HIGH (ref 1.7–7.7)
Neutrophils Relative %: 76 %
Platelets: 277 10*3/uL (ref 150–400)
RBC: 3.35 MIL/uL — ABNORMAL LOW (ref 3.87–5.11)
RDW: 14.3 % (ref 11.5–15.5)
WBC: 10.5 10*3/uL (ref 4.0–10.5)
nRBC: 0 % (ref 0.0–0.2)

## 2023-01-23 LAB — COMPREHENSIVE METABOLIC PANEL
ALT: 14 U/L (ref 0–44)
AST: 15 U/L (ref 15–41)
Albumin: 2.8 g/dL — ABNORMAL LOW (ref 3.5–5.0)
Alkaline Phosphatase: 107 U/L (ref 38–126)
Anion gap: 12 (ref 5–15)
BUN: 52 mg/dL — ABNORMAL HIGH (ref 6–20)
CO2: 24 mmol/L (ref 22–32)
Calcium: 8.8 mg/dL — ABNORMAL LOW (ref 8.9–10.3)
Chloride: 100 mmol/L (ref 98–111)
Creatinine, Ser: 2.53 mg/dL — ABNORMAL HIGH (ref 0.44–1.00)
GFR, Estimated: 21 mL/min — ABNORMAL LOW (ref >60)
Glucose, Bld: 177 mg/dL — ABNORMAL HIGH (ref 70–99)
Potassium: 4.3 mmol/L (ref 3.5–5.1)
Sodium: 136 mmol/L (ref 135–145)
Total Bilirubin: 0.2 mg/dL — ABNORMAL LOW (ref 0.3–1.2)
Total Protein: 6.5 g/dL (ref 6.5–8.1)

## 2023-01-23 NOTE — Telephone Encounter (Signed)
Reason for Disposition  [1] Red area or streak [2] large (> 2 in. or 5 cm)  Answer Assessment - Initial Assessment Questions 1. ONSET: "When did the swelling start?" (e.g., minutes, hours, days)     2-3 days 2. LOCATION: "What part of the leg is swollen?"  "Are both legs swollen or just one leg?"     Ankle to knee- left leg 3. SEVERITY: "How bad is the swelling?" (e.g., localized; mild, moderate, severe)   - Localized: Small area of swelling localized to one leg.   - MILD pedal edema: Swelling limited to foot and ankle, pitting edema < 1/4 inch (6 mm) deep, rest and elevation eliminate most or all swelling.   - MODERATE edema: Swelling of lower leg to knee, pitting edema > 1/4 inch (6 mm) deep, rest and elevation only partially reduce swelling.   - SEVERE edema: Swelling extends above knee, facial or hand swelling present.      moderate 4. REDNESS: "Does the swelling look red or infected?"     Red, swelling, open sores on foot 5. PAIN: "Is the swelling painful to touch?" If Yes, ask: "How painful is it?"   (Scale 1-10; mild, moderate or severe)     Sharp pain- both legs 6. FEVER: "Do you have a fever?" If Yes, ask: "What is it, how was it measured, and when did it start?"      No- leg feels warm 7. CAUSE: "What do you think is causing the leg swelling?"     Circulation- sores on foot 8. MEDICAL HISTORY: "Do you have a history of blood clots (e.g., DVT), cancer, heart failure, kidney disease, or liver failure?"     diabetes 9. RECURRENT SYMPTOM: "Have you had leg swelling before?" If Yes, ask: "When was the last time?" "What happened that time?"     Yes- right leg hx swelling 10. OTHER SYMPTOMS: "Do you have any other symptoms?" (e.g., chest pain, difficulty breathing)       vomiting  Protocols used: Leg Swelling and Edema-A-AH

## 2023-01-23 NOTE — ED Provider Triage Note (Signed)
Emergency Medicine Provider Triage Evaluation Note  SULTANA STOCKHAUSEN , a 62 y.o. female  was evaluated in triage.  Pt complains of leg swelling and erythema. Has multiple complaints, but reports that LLE swelling prompted her visit today. LLE is sore with stabbing pain, "like knives". She has also noted some redness to her BLE with some streaking to her L thigh. Has wound to RLE which has been present for a few weeks; now draining purulence. No documented outpatient fevers. Has been compliant with her Eliquis.  Review of Systems  Positive: As above Negative: As above  Physical Exam  BP (!) 118/56 (BP Location: Right Arm)   Pulse 86   Temp 98.4 F (36.9 C) (Oral)   Resp 18   Ht '5\' 4"'$  (1.626 m)   Wt 95.3 kg   LMP  (LMP Unknown)   SpO2 100%   BMI 36.05 kg/m  Gen:   Awake, no distress   Resp:  Normal effort  MSK:   Moves extremities without difficulty  Other:  Erythema and pitting edema to BLE. Some linear streaking to medial L thigh. Open wound to RLE draining purulence.  Medical Decision Making  Medically screening exam initiated at 10:30 PM.  Appropriate orders placed.  JAKELINE STIEGLER was informed that the remainder of the evaluation will be completed by another provider, this initial triage assessment does not replace that evaluation, and the importance of remaining in the ED until their evaluation is complete.  Lower extremity erythema, edema. - afebrile in the ED. On chronic Eliquis.   Antonietta Breach, PA-C 01/23/23 [id number]

## 2023-01-23 NOTE — Telephone Encounter (Addendum)
Spoke with patient . Patient voices that her left leg is swollen and and she has open wound to right foot. Patient voices that she is having a hard time getting wound supplies and has transportation issues. Patient been referred to [facility] in the past and another referral  to [facility] has been sent. Patient made aware. Advised patient that someone from [facility] will be calling her  to schedule an appointment and she should try to be available to answer the call. Patient voiced understanding of all discussed .

## 2023-01-23 NOTE — Addendum Note (Signed)
Addended by: Evelena Asa F on: 01/23/2023 12:41 PM   Modules accepted: Orders

## 2023-01-23 NOTE — Telephone Encounter (Signed)
  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 Frequency: 2-3 days Pertinent Negatives: Patient denies fever Disposition: [] ED /[] Urgent Care (no appt availability in office) / [] Appointment(In office/virtual)/ []  [place]/ [] Home Care/ [x] Refused Recommended Disposition /[] [facility]/ []  Follow-up with PCP Additional Notes: Patient states she does not have transportation, she does not have supplies at home to care for wounds, patient states she has to have 3 days to get transportation assistance. Patient advised she may be advised to got o ED- EMS- patient is reluctant because she does not have transportation back home. Alert to office sent.

## 2023-01-23 NOTE — ED Triage Notes (Signed)
Pt bib [facility] from home with complaints of left leg pain x 1 week and bil leg swelling for "a long time". Hx of blood clot and neuropathy.  EMS vitals: 140/84, 85HR, 97%RA, 216CBG

## 2023-01-24 ENCOUNTER — Telehealth: Payer: Self-pay | Admitting: *Deleted

## 2023-01-24 ENCOUNTER — Emergency Department (HOSPITAL_COMMUNITY): Payer: 59

## 2023-01-24 DIAGNOSIS — R600 Localized edema: Secondary | ICD-10-CM | POA: Diagnosis not present

## 2023-01-24 LAB — LACTIC ACID, PLASMA: Lactic Acid, Venous: 0.5 mmol/L (ref 0.5–1.9)

## 2023-01-24 LAB — BRAIN NATRIURETIC PEPTIDE: B Natriuretic Peptide: 123.6 pg/mL — ABNORMAL HIGH (ref 0.0–100.0)

## 2023-01-24 MED ORDER — FUROSEMIDE 10 MG/ML IJ SOLN
80.0000 mg | Freq: Once | INTRAMUSCULAR | Status: AC
  Administered 2023-01-24: 80 mg via INTRAVENOUS
  Filled 2023-01-24: qty 8

## 2023-01-24 MED ORDER — ONDANSETRON HCL 4 MG/2ML IJ SOLN
4.0000 mg | Freq: Once | INTRAMUSCULAR | Status: AC
  Administered 2023-01-24: 4 mg via INTRAVENOUS
  Filled 2023-01-24: qty 2

## 2023-01-24 MED ORDER — MORPHINE SULFATE (PF) 4 MG/ML IV SOLN
4.0000 mg | Freq: Once | INTRAVENOUS | Status: AC
  Administered 2023-01-24: 4 mg via INTRAVENOUS
  Filled 2023-01-24: qty 1

## 2023-01-24 MED ORDER — SODIUM CHLORIDE 0.9 % IV SOLN
1.0000 g | Freq: Once | INTRAVENOUS | Status: AC
  Administered 2023-01-24: 1 g via INTRAVENOUS
  Filled 2023-01-24: qty 10

## 2023-01-24 MED ORDER — OXYCODONE-ACETAMINOPHEN 5-325 MG PO TABS
1.0000 | ORAL_TABLET | Freq: Once | ORAL | Status: AC
  Administered 2023-01-24: 1 via ORAL
  Filled 2023-01-24: qty 1

## 2023-01-24 MED ORDER — DOXYCYCLINE HYCLATE 100 MG PO CAPS: 100.0000 mg | ORAL_CAPSULE | Freq: Two times a day (BID) | ORAL | 0 refills | Status: DC

## 2023-01-24 NOTE — Progress Notes (Unsigned)
  Care Coordination Note  01/24/2023 Name: TAIYANA KISSLER MRN: [id number] DOB: 1961/02/24  KEBRINA FRIEND is a 62 y.o. year old female who is a primary care patient of Charlott Rakes, MD and is actively engaged with the care management team. I reached out to [name] by phone today to assist with scheduling an initial visit with the Licensed Clinical Social Worker, moving up initial call with RNCM.   Follow up plan: Unsuccessful telephone outreach attempt made. A HIPAA compliant phone message was left for the patient providing contact information and requesting a return call.   [place]  Direct Dial: [phone]

## 2023-01-24 NOTE — ED Provider Notes (Signed)
[facility] Provider Note   CSN: [id number] Arrival date & time: 01/23/23  2123     History  Chief Complaint  Patient presents with   Leg Pain    [name] is a 62 y.o. female.  Patient presents to the emergency department with complaints of bilateral leg pain.  She reports increased swelling of both of her legs with redness of her legs.  Patient has a history of peripheral arterial disease, CHF, neuropathy in both of her feet.       Home Medications Prior to Admission medications   Medication Sig Start Date End Date Taking? Authorizing Provider  doxycycline (VIBRAMYCIN) 100 MG capsule Take 1 capsule (100 mg total) by mouth 2 (two) times daily. 01/24/23  Yes Stanislawa Gaffin, Gwenyth Allegra, MD  ACCU-CHEK GUIDE test strip USE TO CHECK BLOOD SUGAR FOUR TIMES DAILY 02/25/20   [provider]  acetaminophen (TYLENOL) 500 MG tablet Take 1,000 mg by mouth every 6 (six) hours as needed for headache (pain).    [provider]  albuterol (VENTOLIN HFA) 108 (90 Base) MCG/ACT inhaler USE 2 PUFFS BY MOUTH EVERY FOUR HOURS, AS NEEDED, FOR COUGHING/WHEEZING 07/12/22   Charlott Rakes, MD  allopurinol (ZYLOPRIM) 100 MG tablet TAKE 1 TABLET (100 MG TOTAL) BY MOUTH DAILY(AM) 12/29/22   Charlott Rakes, MD  amiodarone (PACERONE) 200 MG tablet TAKE 1/2 TABLET (100 MG TOTAL) BY MOUTH DAILY (AM) 12/05/22   Larey Dresser, MD  amLODipine (NORVASC) 10 MG tablet Take 1 tablet (10 mg total) by mouth every morning. 07/07/22   Argentina Donovan, PA-C  Ascorbic Acid (VITAMIN C PO) Take 1 tablet by mouth every morning.    [provider]  Aspirin-Salicylamide-Caffeine (BC HEADACHE POWDER PO) Take 1 packet by mouth 4 (four) times daily as needed (pain/headache).    [provider]  budesonide-formoterol (SYMBICORT) 160-4.5 MCG/ACT inhaler Inhale 2 puffs into the lungs 2 (two) times daily. 07/07/22   Argentina Donovan, PA-C  buPROPion  (WELLBUTRIN SR) 150 MG 12 hr tablet Take 1 tablet (150 mg total) by mouth 2 (two) times daily. 12/29/22   Larey Dresser, MD  Cholecalciferol (VITAMIN D3 PO) Take 1 capsule by mouth every morning.    [provider]  colchicine 0.6 MG tablet TAKE 2 TABS (1.2 MG) AT THE ONSET OF A GOUT FLARE, MAY TAKE 1 TAB (0.6 MG) IN 1 HOUR IF SYMPTOMS 01/11/23   Charlott Rakes, MD  Continuous Blood Gluc Sensor (FREESTYLE LIBRE 2 SENSOR) MISC USE 1 (ONE) EACH EVERY 2 WEEKS 07/14/21   Charlott Rakes, MD  Dulaglutide (TRULICITY) [id number] [id number] SOPN Inject 0.75 mg into the skin every Friday. 07/08/22   Argentina Donovan, PA-C  DULoxetine (CYMBALTA) 60 MG capsule Take 1 capsule (60 mg total) by mouth daily. For diabetic neuropathy 10/10/22   Charlott Rakes, MD  EASY COMFORT PEN NEEDLES 31G X 5 MM MISC USE FOUR TIMES A DAY FOR INSULIN ADMINISTRATION 08/30/22   McClung, Angela M, PA-C  ELIQUIS 5 MG TABS tablet TAKE 1 TABLET (5 MG TOTAL) BY MOUTH 2 (TWO) TIMES DAILY (AM+BEDTIME) 08/08/22   Larey Dresser, MD  empagliflozin (JARDIANCE) 10 MG TABS tablet Take 1 tablet (10 mg total) by mouth daily. TAKE 1 TABLET before breakfast daily 07/07/22   Argentina Donovan, PA-C  FEROSUL 325 (65 Fe) MG tablet TAKE 1 TABLET (324 MG TOTAL) BY MOUTH 2 (TWO) TIMES DAILY AS NEEDED. (AM+BEDTIME) 11/04/22   Newlin,  [name], MD  folic acid (FOLVITE) 1 MG tablet TAKE 1 TABLET (1 MG TOTAL) BY MOUTH DAILY (AM) 12/29/22   Charlott Rakes, MD  hydrALAZINE (APRESOLINE) 50 MG tablet TAKE 1 & 1/2 TABLETS (75 MG TOTAL) BY MOUTH 3 (THREE) TIMES DAILY (AM+NOON+BEDTIME) 11/03/22   [place], Maricela Bo, FNP  ibuprofen (ADVIL) 600 MG tablet Take 1 tablet by mouth as needed.    [provider]  icosapent Ethyl (VASCEPA) 1 g capsule Take 2 capsules (2 g total) by mouth 2 (two) times daily. 11/04/22   Lyda Jester M, PA-C  insulin lispro (HUMALOG) 100 UNIT/ML KwikPen Inject 10 Units into the skin 3 (three) times daily with meals. 09/13/22    Charlott Rakes, MD  LANTUS SOLOSTAR 100 UNIT/ML Solostar Pen Inject 37 Units into the skin daily. Patient taking differently: Inject 37 Units into the skin at bedtime. 10/10/22   Charlott Rakes, MD  levothyroxine (SYNTHROID) 50 MCG tablet TAKE 1 TABLET (50 MCG TOTAL) BY MOUTH DAILY BEFORE BREAKFAST (AM) 10/11/22   Charlott Rakes, MD  losartan (COZAAR) 25 MG tablet TAKE 2 TABLETS (50 MG TOTAL) BY MOUTH DAILY (AM) 10/10/22   Charlott Rakes, MD  Omega-3 Fatty Acids (FISH OIL PO) Take 1 capsule by mouth every morning.    [provider]  ondansetron (ZOFRAN) 4 MG tablet Take 1 tablet (4 mg total) by mouth every 8 (eight) hours as needed for nausea or vomiting. 08/11/21   Charlott Rakes, MD  pantoprazole (PROTONIX) 40 MG tablet TAKE 1 TABLET (40 MG TOTAL) BY MOUTH DAILY(AM) 12/29/22   Charlott Rakes, MD  potassium chloride SA (KLOR-CON M) 20 MEQ tablet Take 1 tablet (20 mEq total) by mouth 2 (two) times daily. 11/29/22   Larey Dresser, MD  rosuvastatin (CRESTOR) 40 MG tablet Take 1 tablet (40 mg total) by mouth daily. 09/07/22   Warren Lacy, PA-C  Semaglutide,0.25 or 0.'5MG'$ /DOS, (OZEMPIC, 0.25 OR 0.5 MG/DOSE,) 2 MG/1.5ML SOPN Inject 0.5 mg into the skin once a week. 10/10/22   Charlott Rakes, MD  spironolactone (ALDACTONE) 25 MG tablet TAKE 0.5 TABLET (12.5 MG TOTAL) BY MOUTH DAILY (AM) 10/11/22   Larey Dresser, MD  torsemide (DEMADEX) 100 MG tablet TAKE 1 TABLET (100 MG TOTAL) BY MOUTH 2 (TWO) TIMES DAILY. (AM+BEDTIME) 12/29/22   Larey Dresser, MD  triamcinolone 0.1% oint-Cerave equivalent lotion 1:1 mixture Apply topically 2 (two) times daily as needed. 07/07/22   Argentina Donovan, PA-C  tiotropium (SPIRIVA HANDIHALER) 18 MCG inhalation capsule Place 1 capsule (18 mcg total) into inhaler and inhale every morning. Patient not taking: Reported on 02/09/2021 12/09/19 02/09/21  Eugenie Filler, MD      Allergies    Gabapentin and Lyrica [pregabalin]    Review of Systems    Review of Systems  Physical Exam Updated Vital Signs BP 113/77 (BP Location: Right Arm)   Pulse 80   Temp 98.2 F (36.8 C) (Oral)   Resp 18   Ht '5\' 4"'$  (1.626 m)   Wt 95.3 kg   LMP  (LMP Unknown)   SpO2 100%   BMI 36.05 kg/m  Physical Exam Vitals and nursing note reviewed.  Constitutional:      General: She is not in acute distress.    Appearance: She is well-developed.  HENT:     Head: Normocephalic and atraumatic.     Mouth/Throat:     Mouth: Mucous membranes are moist.  Eyes:     General: Vision grossly intact. Gaze aligned appropriately.  Extraocular Movements: Extraocular movements intact.     Conjunctiva/sclera: Conjunctivae normal.  Cardiovascular:     Rate and Rhythm: Normal rate and regular rhythm.     Pulses:          Dorsalis pedis pulses are detected w/ Doppler on the right side and 2+ on the left side.     Heart sounds: Normal heart sounds, S1 normal and S2 normal. No murmur heard.    No friction rub. No gallop.  Pulmonary:     Effort: Pulmonary effort is normal. No respiratory distress.     Breath sounds: Normal breath sounds.  Abdominal:     General: Bowel sounds are normal.     Palpations: Abdomen is soft.     Tenderness: There is no abdominal tenderness. There is no guarding or rebound.     Hernia: No hernia is present.  Musculoskeletal:        General: No swelling.     Cervical back: Full passive range of motion without pain, normal range of motion and neck supple. No spinous process tenderness or muscular tenderness. Normal range of motion.     Right lower leg: 2+ Pitting Edema present.     Left lower leg: 2+ Pitting Edema present.  Skin:    General: Skin is warm and dry.     Capillary Refill: Capillary refill takes less than 2 seconds.     Findings: Rash (Bilateral slight area edema with mild warmth, stasis dermatitis) and wound (Right mid shin, ulcerated area without drainage, area edema, induration; black eschar right foot stump; swelling  with eschar lateral aspect of left foot) present. No ecchymosis or erythema.  Neurological:     General: No focal deficit present.     Mental Status: She is alert and oriented to person, place, and time.     GCS: GCS eye subscore is 4. GCS verbal subscore is 5. GCS motor subscore is 6.     Cranial Nerves: Cranial nerves 2-12 are intact.     Sensory: Sensation is intact.     Motor: Motor function is intact.     Coordination: Coordination is intact.  Psychiatric:        Attention and Perception: Attention normal.        Mood and Affect: Mood normal.        Speech: Speech normal.        Behavior: Behavior normal.     ED Results / Procedures / Treatments   Labs (all labs ordered are listed, but only abnormal results are displayed) Labs Reviewed  CBC WITH DIFFERENTIAL/PLATELET - Abnormal; Notable for the following components:      Result Value   RBC 3.35 (*)    Hemoglobin 10.1 (*)    HCT 32.0 (*)    Neutro Abs 8.0 (*)    Abs Immature Granulocytes 0.08 (*)    All other components within normal limits  COMPREHENSIVE METABOLIC PANEL - Abnormal; Notable for the following components:   Glucose, Bld 177 (*)    BUN 52 (*)    Creatinine, Ser 2.53 (*)    Calcium 8.8 (*)    Albumin 2.8 (*)    Total Bilirubin 0.2 (*)    GFR, Estimated 21 (*)    All other components within normal limits  BRAIN NATRIURETIC PEPTIDE - Abnormal; Notable for the following components:   B Natriuretic Peptide 123.6 (*)    All other components within normal limits  LACTIC ACID, PLASMA    EKG None  Radiology DG Chest Port 1 View  Result Date: 01/24/2023 CLINICAL DATA:  edema EXAM: PORTABLE CHEST 1 VIEW.  Patient is rotated COMPARISON:  Chest x-ray 11/24/2022 FINDINGS: The heart and mediastinal contours are unchanged. CardioMEMS device overlies the left chest. Low lung volumes no focal consolidation. No pulmonary edema. No pleural effusion. No pneumothorax. No acute osseous abnormality. IMPRESSION: Low lung  volumes with no active disease. Electronically Signed   By: [name] M.D.   On: 01/24/2023 03:30   DG Tibia/Fibula Left  Result Date: 01/23/2023 CLINICAL DATA:  Erythema and swelling. EXAM: RIGHT TIBIA AND FIBULA - 2 VIEW; LEFT TIBIA AND FIBULA - 2 VIEW COMPARISON:  11/24/2022, 02/09/2021. FINDINGS: Left tibia and fibula: There is no evidence of fracture or other focal bone lesions. Mild tricompartmental degenerative changes are present at the knee diffuse soft tissue swelling is noted. Surgical clips and vascular calcifications are noted in the soft tissues. Right tibia and fibula: No acute fracture or dislocation. No other focal bone lesions. There is diffuse soft tissue swelling with vascular calcifications. Large calcaneal spur. Moderate tricompartmental degenerative changes at the knee. IMPRESSION: No acute osseous abnormality. Electronically Signed   By: Brett Fairy M.D.   On: 01/23/2023 23:14   DG Tibia/Fibula Right  Result Date: 01/23/2023 CLINICAL DATA:  Erythema and swelling. EXAM: RIGHT TIBIA AND FIBULA - 2 VIEW; LEFT TIBIA AND FIBULA - 2 VIEW COMPARISON:  11/24/2022, 02/09/2021. FINDINGS: Left tibia and fibula: There is no evidence of fracture or other focal bone lesions. Mild tricompartmental degenerative changes are present at the knee diffuse soft tissue swelling is noted. Surgical clips and vascular calcifications are noted in the soft tissues. Right tibia and fibula: No acute fracture or dislocation. No other focal bone lesions. There is diffuse soft tissue swelling with vascular calcifications. Large calcaneal spur. Moderate tricompartmental degenerative changes at the knee. IMPRESSION: No acute osseous abnormality. Electronically Signed   By: Brett Fairy M.D.   On: 01/23/2023 23:14    Procedures Procedures    Medications Ordered in ED Medications  oxyCODONE-acetaminophen (PERCOCET/ROXICET) 5-325 MG per tablet 1 tablet (1 tablet Oral Given 01/24/23 0347)  furosemide  (LASIX) injection 80 mg (80 mg Intravenous Given 01/24/23 [id number])  cefTRIAXone (ROCEPHIN) 1 g in sodium chloride 0.9 % 100 mL IVPB (0 g Intravenous Stopped 01/24/23 0655)  ondansetron (ZOFRAN) injection 4 mg (4 mg Intravenous Given 01/24/23 0622)  morphine (PF) 4 MG/ML injection 4 mg (4 mg Intravenous Given 01/24/23 [id number])    ED Course/ Medical Decision Making/ A&P                             Medical Decision Making Amount and/or Complexity of Data Reviewed Labs: ordered. Radiology: ordered.  Risk Prescription drug management.   Differential Diagnosis considered includes, but not limited to: CHF; venous stasis; DVT; renal failure; nephrotic syndrome; anasarca; cellulitis   Patient presents to the emergency department for evaluation of bilateral lower extremity swelling.  Patient has a history of chronic edema of the lower extremities.  Patient complaining of pain secondary to the swelling but more likely secondary to her chronic neuropathy.  Patient does have symmetric erythema of both legs without warmth.  She does have a history of known right-sided peripheral arterial disease.  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  There is a chronic wound at the distal portion of the foot.  There is no drainage or surrounding erythema to  suggest infection.  There is also a chronic wound of the right shin and left foot.  She sees a podiatrist for her foot problems and has scheduled follow-up.  Swelling is pitting in nature, diffuse and symmetric.  No calf tenderness, venous cords.  Very low concern for DVT.  She is on Eliquis already.  Patient does not appear to be in heart failure.  Chest x-ray is clear.  BNP is 123.  Presentation is consistent with worsening of chronic lower extremity edema which is peripheral in nature.  Given Lasix to initiate a short diuresis.  Given Rocephin to cover for possible infection, although I feel that the redness that she is experiencing is secondary to  stasis dermatitis.  Will discharge on antibiotics.  Follow-up with podiatry and her primary care doctor or cardiologist.          Final Clinical Impression(s) / ED Diagnoses Final diagnoses:  Bilateral leg edema    Rx / DC Orders ED Discharge Orders          Ordered    doxycycline (VIBRAMYCIN) 100 MG capsule  2 times daily        01/24/23 0702              Orpah Greek, MD 01/24/23 [phone]

## 2023-01-24 NOTE — Telephone Encounter (Signed)
She can be referred to the in-home services that Opal Sidles talked about to assume her care as PCP if she is unable to leave the house.

## 2023-01-24 NOTE — Discharge Instructions (Signed)
Continue your fluid pills as prescribed.  Elevate the legs as frequently as possible.  Schedule follow-up with your podiatrist to reevaluate your feet.  Take the antibiotic as prescribed, follow-up with your primary doctor for recheck of your legs.

## 2023-01-25 ENCOUNTER — Telehealth: Payer: Self-pay

## 2023-01-25 NOTE — Transitions of Care (Post Inpatient/ED Visit) (Signed)
   01/25/2023  Name: [name] MRN: [id number] DOB: 23-Feb-1961  Today's TOC FU Call Status: Today's TOC FU Call Status:: Successful TOC FU Call Competed TOC FU Call Complete Date: 01/25/23 Incoming call from patient returning RN CM call. Transition Care Management Follow-up Telephone Call Date of Discharge: 01/24/23 Discharge Facility: [facility]) Type of Discharge: Emergency Department Reason for ED Visit: Other: ("bilateral leg edema") How have you been since you were released from the hospital?: Better (Patient pleased to report that "swelling is a lot better." she States she feels better overall.) Any questions or concerns?: No  Items Reviewed: Did you receive and understand the discharge instructions provided?: Yes Medications obtained and verified?: Yes (Medications Reviewed) Any new allergies since your discharge?: No Dietary orders reviewed?: Yes Type of Diet Ordered:: low salt/heart healthy Do you have support at home?: Yes People in Home: sibling(s) Name of Support/Comfort Primary Source: sister  Home Care and Equipment/Supplies: [place] Ordered?: NA Any new equipment or medical supplies ordered?: NA  Functional Questionnaire: Do you need assistance with bathing/showering or dressing?: No Do you need assistance with meal preparation?: No Do you need assistance with eating?: No Do you have difficulty maintaining continence: No Do you need assistance with getting out of bed/getting out of a chair/moving?: No Do you have difficulty managing or taking your medications?: No  Folllow up appointments reviewed: PCP Follow-up appointment confirmed?: Yes Date of PCP follow-up appointment?: 02/15/23 Follow-up Provider: Dr. Margarita Rana Specialist [facility] Follow-up appointment confirmed?: Yes Date of Specialist follow-up appointment?: 02/03/23 Follow-Up Specialty Provider:: Dr. Prudence Davidson Do you need transportation to your follow-up appointment?: No Do you  understand care options if your condition(s) worsen?: Yes-patient verbalized understanding  SDOH Interventions Today    Flowsheet Row Most Recent Value  SDOH Interventions   Food Insecurity Interventions Intervention Not Indicated  Transportation Interventions Intervention Not Indicated  [pt states she uses [employer] transportation]      TOC Interventions Today    Flowsheet Row Most Recent Value  TOC Interventions   TOC Interventions Discussed/Reviewed TOC Interventions Discussed, Post discharge activity limitations per provider       Interventions Today    Flowsheet Row Most Recent Value  General Interventions   General Interventions Discussed/Reviewed General Interventions Discussed  Education Interventions   Education Provided Provided Education  [wgt monitoring]  Provided Verbal Education On Nutrition, Medication, When to see the doctor  Nutrition Interventions   Nutrition Discussed/Reviewed Nutrition Discussed, Decreasing sugar intake  Pharmacy Interventions   Pharmacy Dicussed/Reviewed Pharmacy Topics Discussed, Medications and their functions       [facility] Care Management Care Management Community Coordinator Direct Phone: [phone] Toll Free: [phone] Fax: [phone]

## 2023-01-25 NOTE — Progress Notes (Signed)
  Care Coordination Note  01/25/2023 Name: [name] MRN: [id number] DOB: 01-11-1961  [name] is a 62 y.o. year old female who is a primary care patient of Charlott Rakes, MD and is actively engaged with the care management team. I reached out to [name] by phone today to assist with re-scheduling an initial visit with the RN Case Manager  Follow up plan: Telephone appointment with care management team member scheduled for:01/30/23 with [facility] and Initial outreach with SW schedule 01/26/23  [place]  Direct Dial: [phone]

## 2023-01-25 NOTE — Transitions of Care (Post Inpatient/ED Visit) (Signed)
   01/25/2023  Name: [name] MRN: [id number] DOB: 02-Mar-1961  Today's TOC FU Call Status: Today's TOC FU Call Status:: Unsuccessul Call (1st Attempt) Unsuccessful Call (1st Attempt) Date: 01/25/23  Attempted to reach the patient regarding the most recent Inpatient/ED visit.  Follow Up Plan: Additional outreach attempts will be made to reach the patient to complete the Transitions of Care (Post Inpatient/ED visit) call.     Enzo Montgomery, RN,BSN,CCM [facility] Care Management Care Management Community Coordinator Direct Phone: [phone] Toll Free: [phone] Fax: [phone]

## 2023-01-26 ENCOUNTER — Ambulatory Visit: Payer: Self-pay | Admitting: Licensed Clinical Social Worker

## 2023-01-26 NOTE — Patient Instructions (Signed)
Visit Information  Thank you for taking time to visit with me today. Please don't hesitate to contact me if I can be of assistance to you.   Following are the goals we discussed today:   Goals Addressed             This Visit's Progress    Obtain Assistance with Wheelchair   On track    Activities and task to complete in order to accomplish goals.   Keep all upcoming appointments discussed today Continue with compliance of taking medication prescribed by Doctor Implement healthy coping skills discussed to assist with management of symptoms Continue utilizing [facility] for transportation needs to medical appts         Our next appointment is by telephone on 3/25 at 3 PM  Please call the care guide team at [phone] if you need to cancel or reschedule your appointment.   If you are experiencing a Mental Health or Mutual or need someone to talk to, please call the Suicide and Crisis Lifeline: 988 call 911   Patient verbalizes understanding of instructions and care plan provided today and agrees to view in [place]. Active MyChart status and patient understanding of how to access instructions and care plan via MyChart confirmed with patient.     Christa See, MSW, [email or web address] Phone [phone] 3:27 PM

## 2023-01-26 NOTE — Patient Outreach (Signed)
  Care Coordination   Initial Visit Note   01/26/2023 Name: [name] MRN: [id number] DOB: 1960-12-04  [name] is a 62 y.o. year old female who sees Charlott Rakes, MD for primary care. I spoke with  [place] sister, Dalene Seltzer, by phone today.  What matters to the patients health and wellness today?  Transportation, Caregiver Stress, DME    Goals Addressed             This Visit's Progress    Obtain Assistance with Wheelchair   On track    Activities and task to complete in order to accomplish goals.   Keep all upcoming appointments discussed today Continue with compliance of taking medication prescribed by Doctor Implement healthy coping skills discussed to assist with management of symptoms Continue utilizing [facility] for transportation needs to medical appts         SDOH assessments and interventions completed:  Yes  SDOH Interventions Today    Flowsheet Row Most Recent Value  SDOH Interventions   Housing Interventions Intervention Not Indicated        Care Coordination Interventions:  Yes, provided  Interventions Today    Flowsheet Row Most Recent Value  Chronic Disease   Chronic disease during today's visit Diabetes, Hypertension (HTN), Congestive Heart Failure (CHF)  General Interventions   General Interventions Discussed/Reviewed General Interventions Discussed, [employer] introduced self and explained role with care coordination. LCSW reviewed upcoming appts. Discussed eligibility for Legal Aid referral to assist with landlord concerns]  [place] Discussed  [Caregiver stress acknoledged. Validation and encouragement provided]  Safety Interventions   Safety Discussed/Reviewed Safety Discussed  [Patient utilizes walker in home. Family endorses continued falls and interest in obtaining an electric wheelchair. Process discussed with family]       Follow up plan:  Follow up call scheduled for 1-2 weeks    Encounter Outcome:  Pt. Visit Completed   Christa See, MSW, [email or web address] Phone [phone] 3:26 PM

## 2023-01-30 ENCOUNTER — Other Ambulatory Visit: Payer: Self-pay | Admitting: Family Medicine

## 2023-01-30 ENCOUNTER — Ambulatory Visit: Payer: Self-pay

## 2023-01-30 DIAGNOSIS — E1151 Type 2 diabetes mellitus with diabetic peripheral angiopathy without gangrene: Secondary | ICD-10-CM

## 2023-01-30 DIAGNOSIS — I10 Essential (primary) hypertension: Secondary | ICD-10-CM

## 2023-01-30 NOTE — Patient Instructions (Signed)
Visit Information  Thank you for taking time to visit with me today. Please don't hesitate to contact me if I can be of assistance to you.   Following are the goals we discussed today:   Goals Addressed             This Visit's Progress    To contact kidney doctor for re-establishment of care       Care Coordination Interventions: Assessed the Patient understanding of chronic kidney disease    Evaluation of current treatment plan related to chronic kidney disease self management and patient's adherence to plan as established by provider      Reviewed prescribed diet increase water intake to 48-64 oz daily unless otherwise directed  Reviewed medications with patient and discussed importance of compliance    Advised patient to discuss Nephrology referral  with provider    Discussed the impact of chronic kidney disease on daily life and mental health and acknowledged and normalized feelings of disempowerment, fear, and frustration    Assessed social determinant of health barriers    Provided education on kidney disease progression    Educated patient on stages of CKD and the importance of getting re-established with kidney specialist to help management stage IV CKD Determined patient was established with [facility], provided patient with the contact number and encouraged patient to schedule a follow up ASAP Mailed printed educational materials related to Eating Right with Chronic Kidney diease        To get A1c <7.0 %       Care Coordination Interventions: Provided education to patient about basic DM disease process Reviewed medications with patient and discussed importance of medication adherence Counseled on importance of regular laboratory monitoring as prescribed Provided patient with written educational materials related to hypo and hyperglycemia and importance of correct treatment Advised patient, providing education and rationale, to check cbg daily before meals and at  bedtime and record, calling PCP for findings outside established parameters Review of patient status, including review of consultants reports, relevant laboratory and other test results, and medications completed Counseled on Diabetic diet, my plate method Printed educational materials related to dm management         To have wound healing to right great toe w/o complications       Care Coordination Interventions: Evaluation of current treatment plan related to wound to right foot near right great toe and patient's adherence to plan as established by provider Determined patient has an open, drainage ulcer to her right upper foot near her right great toe Assessed for signs/symptoms of infection and when to seek medical care Determined patient lives with her sister who helps inspect this area daily Discussed upcoming scheduled appointments with Podiatry and the [facility] wound clinic, patient has transportation via health plan Educated patient on potential complication of wound healing secondary to lower leg edema and uncontrolled dm            Our next appointment is by telephone on 02/17/23 at 10:30 AM  Please call the care guide team at [phone] if you need to cancel or reschedule your appointment.   If you are experiencing a Mental Health or [place] or need someone to talk to, please call 1-800-273-TALK (toll free, 24 hour hotline) go to [facility] [address] [place] [phone])  Patient verbalizes understanding of instructions and care plan provided today and agrees to view in [place]. Active MyChart status and patient understanding of  how to access instructions and care plan via MyChart confirmed with patient.     Barb Merino, RN, BSN, CCM Care Management Coordinator [facility] Care Management  Direct Phone: [phone]

## 2023-01-30 NOTE — Patient Outreach (Signed)
Care Coordination   Initial Visit Note   01/30/2023 Name: [name] MRN: [id number] DOB: [birth year]  [name] is a 62 y.o. year old female who sees Karla Rakes, MD for primary care. I spoke with  [name] by phone today.  What matters to the patients health and wellness today?  Patient would like to avoid having another amputation. She will f/u with the wound clinic and Podiatry as directed.     Goals Addressed             This Visit's Progress    To contact kidney doctor for re-establishment of care       Care Coordination Interventions: Assessed the Patient understanding of chronic kidney disease    Evaluation of current treatment plan related to chronic kidney disease self management and patient's adherence to plan as established by provider      Reviewed prescribed diet increase water intake to 48-64 oz daily unless otherwise directed  Reviewed medications with patient and discussed importance of compliance    Advised patient to discuss Nephrology referral  with provider    Discussed the impact of chronic kidney disease on daily life and mental health and acknowledged and normalized feelings of disempowerment, fear, and frustration    Assessed social determinant of health barriers    Provided education on kidney disease progression    Educated patient on stages of CKD and the importance of getting re-established with kidney specialist to help management stage IV CKD Determined patient was established with [facility], provided patient with the contact number and encouraged patient to schedule a follow up ASAP Mailed printed educational materials related to Eating Right with Chronic Kidney diease        To get A1c <7.0 %       Care Coordination Interventions: Provided education to patient about basic DM disease process Reviewed medications with patient and discussed importance of medication adherence Counseled on importance of regular laboratory  monitoring as prescribed Provided patient with written educational materials related to hypo and hyperglycemia and importance of correct treatment Advised patient, providing education and rationale, to check cbg daily before meals and at bedtime and record, calling PCP for findings outside established parameters Review of patient status, including review of consultants reports, relevant laboratory and other test results, and medications completed Counseled on Diabetic diet, my plate method Printed educational materials related to dm management         To have wound healing to right great toe w/o complications       Care Coordination Interventions: Evaluation of current treatment plan related to wound to right foot near right great toe and patient's adherence to plan as established by provider Determined patient has an open, drainage ulcer to her right upper foot near her right great toe Assessed for signs/symptoms of infection and when to seek medical care Determined patient lives with her sister who helps inspect this area daily Discussed upcoming scheduled appointments with Podiatry and the [facility] wound clinic, patient has transportation via health plan Educated patient on potential complication of wound healing secondary to lower leg edema and uncontrolled dm        Interventions Today    Flowsheet Row Most Recent Value  Chronic Disease   Chronic disease during today's visit Diabetes, Chronic Kidney Disease/End Stage Renal Disease (ESRD), Other  [foot wound]  General Interventions   General Interventions Discussed/Reviewed General Interventions Discussed, General Interventions Reviewed, Labs, Doctor Visits, Communication with  Christa See  LCSW]  Labs Kidney Function, Hgb A1c every 3 months  Doctor Visits Discussed/Reviewed Doctor Visits Discussed, Doctor Visits Reviewed, PCP, Specialist  PCP/Specialist Visits Compliance with follow-up visit, Contact provider for referral to   Contacted provider for referral to Specialist  [Nephrologist]  Education Interventions   Education Provided Provided Printed Education, Provided Education  Provided Verbal Education On Nutrition, Foot Care, Labs, Blood Sugar Monitoring, Medication, When to see the doctor  Labs Reviewed Hgb A1c, Kidney Function  Nutrition Interventions   Nutrition Discussed/Reviewed Nutrition Discussed, Nutrition Reviewed, Carbohydrate meal planning, Portion sizes, Fluid intake  Pharmacy Interventions   Pharmacy Dicussed/Reviewed Pharmacy Topics Discussed, Medications and their functions  Safety Interventions   Safety Discussed/Reviewed Safety Discussed          SDOH assessments and interventions completed:  No     Care Coordination Interventions:  Yes, provided   Follow up plan: Referral made to Christa See LCSW to assist with housing  Follow up call scheduled for 02/17/23 @10 :30 AM    Encounter Outcome:  Pt. Visit Completed

## 2023-01-30 NOTE — Telephone Encounter (Signed)
Patient has been seen in the ER 

## 2023-02-01 ENCOUNTER — Other Ambulatory Visit: Payer: Self-pay

## 2023-02-01 ENCOUNTER — Telehealth: Payer: Self-pay | Admitting: Family Medicine

## 2023-02-01 ENCOUNTER — Ambulatory Visit (HOSPITAL_COMMUNITY): Admission: RE | Admit: 2023-02-01 | Payer: 59 | Source: Ambulatory Visit

## 2023-02-01 DIAGNOSIS — I5042 Chronic combined systolic (congestive) and diastolic (congestive) heart failure: Secondary | ICD-10-CM

## 2023-02-01 NOTE — Telephone Encounter (Signed)
Called patient to schedule Medicare Annual Wellness Visit (AWV). Left message for patient to call back and schedule Medicare Annual Wellness Visit (AWV).  Last date of AWV:  due awvi 09/14/18 per [facility]    If any questions, please contact me at [phone].  Thank you ,  [employer] AWV direct phone # [phone]

## 2023-02-01 NOTE — Telephone Encounter (Signed)
Contacted [name] to schedule their annual wellness visit. Appointment made for 02/09/23. [name] AWV direct phone # [phone]   Patient returned my call to schedule appt

## 2023-02-01 NOTE — Telephone Encounter (Signed)
Spoke with patient regarding services that [facility] and that she was being referred patient voiced that she was ok with this. Also patient had voiced she was interested in the services that [facility] provides. Spoke with PCP and  the  referred to [facility] was ok'd . Referral sent for [facility]) and [facility] (FAXED).

## 2023-02-02 ENCOUNTER — Telehealth: Payer: Self-pay | Admitting: Licensed Clinical Social Worker

## 2023-02-02 NOTE — Patient Outreach (Signed)
  Care Coordination   02/02/2023 Name: [name] MRN: [id number] DOB: [birth year]   Care Coordination Outreach Attempts:  An unsuccessful telephone outreach was attempted today to offer the patient information about available care coordination services as a benefit of their health plan.   Follow Up Plan:  Additional outreach attempts will be made to offer the patient care coordination information and services.   Encounter Outcome:  No Answer   Care Coordination Interventions:  No, not indicated    Christa See, MSW, [email or web address] Phone [phone] 5:04 PM

## 2023-02-03 ENCOUNTER — Telehealth: Payer: Self-pay | Admitting: Family Medicine

## 2023-02-03 ENCOUNTER — Ambulatory Visit (INDEPENDENT_AMBULATORY_CARE_PROVIDER_SITE_OTHER): Payer: 59 | Admitting: Podiatry

## 2023-02-03 ENCOUNTER — Ambulatory Visit: Payer: 59

## 2023-02-03 DIAGNOSIS — B351 Tinea unguium: Secondary | ICD-10-CM

## 2023-02-03 DIAGNOSIS — Z89431 Acquired absence of right foot: Secondary | ICD-10-CM | POA: Diagnosis not present

## 2023-02-03 DIAGNOSIS — L97501 Non-pressure chronic ulcer of other part of unspecified foot limited to breakdown of skin: Secondary | ICD-10-CM

## 2023-02-03 DIAGNOSIS — E1151 Type 2 diabetes mellitus with diabetic peripheral angiopathy without gangrene: Secondary | ICD-10-CM | POA: Diagnosis not present

## 2023-02-03 DIAGNOSIS — M79676 Pain in unspecified toe(s): Secondary | ICD-10-CM | POA: Diagnosis not present

## 2023-02-03 DIAGNOSIS — L84 Corns and callosities: Secondary | ICD-10-CM

## 2023-02-03 NOTE — Progress Notes (Signed)
This patient returns to my office for at risk foot care.  This patient requires this care by a professional since this patient will be at risk due to having  PAD, coagulation defect, and chronic kidney disease.  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  Patient is taking plavix.  This patient is unable to cut nails herself since the patient cannot reach her nails.These nails on her left foot  are painful walking and wearing shoes.   She also has a hemorrhagic callus  noted along  the amputation site  right foot. Hemorrhagic callus on the outside of her left foot. This patient presents for at risk foot care today.  General Appearance  Alert, conversant and in no acute stress.  Vascular  Dorsalis pedis and posterior tibial  pulses are not  palpable  left foot.  Capillary return is diminished.  Cold feet    Neurologic  Senn-Weinstein monofilament wire test diminished.  bilaterally. Muscle power within normal limits bilaterally.  Nails Thick disfigured discolored nails with subungual debris  1-4 left foot.  . No evidence of bacterial infection or drainage bilaterally.  Orthopedic  No limitations of motion  feet .  No crepitus or effusions noted.  No bony pathology or digital deformities noted. TMA right foot.  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  No signs of infections or ulcers noted.    Ulcer/ hemorrhagic callus  noted distal first metatarsal head right foot.  No evidence of redness or infection noted. Hemorrhagic callus sub 5th metabase left foot.    Onychomycosis    Pain in left toes  Callus/hemorrhagic callus right foot.  Consent was obtained for treatment procedures.   Mechanical debridement of nails 1-4  Left foot. performed with a nail nipper.  Filed with dremel without incident. Debridement of callus distal 1st metatarsal right foot.  After debridement with # 15 blade the site was smoothed with dremel tool.      Return office visit   10 weeks                     Told patient to return for periodic foot care and evaluation due to potential at risk complications.   Gardiner Barefoot DPM

## 2023-02-03 NOTE — Telephone Encounter (Signed)
  Ice calling from [employer] is calling to request Medication: Continuous Blood Gluc Sensor (FREESTYLE LIBRE 2 SENSOR) [place] [id number]  & Reader & Chart Notes to be sent through [facility] Number Fax [phone] Phone  [phone]

## 2023-02-06 ENCOUNTER — Ambulatory Visit: Payer: Self-pay | Admitting: Licensed Clinical Social Worker

## 2023-02-06 ENCOUNTER — Encounter (HOSPITAL_BASED_OUTPATIENT_CLINIC_OR_DEPARTMENT_OTHER): Payer: 59 | Attending: Internal Medicine | Admitting: Internal Medicine

## 2023-02-06 DIAGNOSIS — J449 Chronic obstructive pulmonary disease, unspecified: Secondary | ICD-10-CM | POA: Insufficient documentation

## 2023-02-06 DIAGNOSIS — I480 Paroxysmal atrial fibrillation: Secondary | ICD-10-CM | POA: Insufficient documentation

## 2023-02-06 DIAGNOSIS — E1151 Type 2 diabetes mellitus with diabetic peripheral angiopathy without gangrene: Secondary | ICD-10-CM | POA: Diagnosis not present

## 2023-02-06 DIAGNOSIS — N1832 Chronic kidney disease, stage 3b: Secondary | ICD-10-CM | POA: Insufficient documentation

## 2023-02-06 DIAGNOSIS — I2510 Atherosclerotic heart disease of native coronary artery without angina pectoris: Secondary | ICD-10-CM | POA: Diagnosis not present

## 2023-02-06 DIAGNOSIS — E11628 Type 2 diabetes mellitus with other skin complications: Secondary | ICD-10-CM | POA: Diagnosis not present

## 2023-02-06 DIAGNOSIS — K7460 Unspecified cirrhosis of liver: Secondary | ICD-10-CM | POA: Diagnosis not present

## 2023-02-06 DIAGNOSIS — M1990 Unspecified osteoarthritis, unspecified site: Secondary | ICD-10-CM | POA: Diagnosis not present

## 2023-02-06 DIAGNOSIS — I130 Hypertensive heart and chronic kidney disease with heart failure and stage 1 through stage 4 chronic kidney disease, or unspecified chronic kidney disease: Secondary | ICD-10-CM | POA: Diagnosis not present

## 2023-02-06 DIAGNOSIS — L97519 Non-pressure chronic ulcer of other part of right foot with unspecified severity: Secondary | ICD-10-CM | POA: Diagnosis not present

## 2023-02-06 DIAGNOSIS — I509 Heart failure, unspecified: Secondary | ICD-10-CM | POA: Diagnosis not present

## 2023-02-06 DIAGNOSIS — E11621 Type 2 diabetes mellitus with foot ulcer: Secondary | ICD-10-CM | POA: Diagnosis not present

## 2023-02-06 DIAGNOSIS — E1140 Type 2 diabetes mellitus with diabetic neuropathy, unspecified: Secondary | ICD-10-CM | POA: Diagnosis not present

## 2023-02-06 DIAGNOSIS — Z833 Family history of diabetes mellitus: Secondary | ICD-10-CM | POA: Insufficient documentation

## 2023-02-06 DIAGNOSIS — E1122 Type 2 diabetes mellitus with diabetic chronic kidney disease: Secondary | ICD-10-CM | POA: Diagnosis not present

## 2023-02-06 DIAGNOSIS — G4730 Sleep apnea, unspecified: Secondary | ICD-10-CM | POA: Diagnosis not present

## 2023-02-06 DIAGNOSIS — I872 Venous insufficiency (chronic) (peripheral): Secondary | ICD-10-CM | POA: Diagnosis not present

## 2023-02-06 DIAGNOSIS — E785 Hyperlipidemia, unspecified: Secondary | ICD-10-CM | POA: Diagnosis not present

## 2023-02-06 NOTE — Telephone Encounter (Signed)
Patient has upcoming appointment and order and office notes will be faxed after visit.

## 2023-02-07 ENCOUNTER — Ambulatory Visit: Payer: Medicare Other | Admitting: Family Medicine

## 2023-02-07 NOTE — Patient Outreach (Signed)
  Care Coordination   Follow Up Visit Note   02/06/2023 Name: [name] MRN: [id number] DOB: [birth year]  [name] is a 62 y.o. year old female who sees Karla Rakes, MD for primary care. I spoke with  [name] by phone today.  What matters to the patients health and wellness today?  Housing Resources/Symptom Management    Goals Addressed             This Visit's Progress    Obtain Assistance with Wheelchair   On track    Activities and task to complete in order to accomplish goals.   Keep all upcoming appointments discussed today Continue with compliance of taking medication prescribed by Doctor Implement healthy coping skills discussed to assist with management of symptoms Continue utilizing [name] for transportation needs to medical appts Contact LCSW or RNCM at [name] if you are interested in having a Legal Aid referral completed to assist with housing needs         SDOH assessments and interventions completed:  No     Care Coordination Interventions:  Yes, provided  Interventions Today    Flowsheet Row Most Recent Value  Chronic Disease   Chronic disease during today's visit Diabetes, Hypertension (HTN), Congestive Heart Failure (CHF), Atrial Fibrillation (AFib), Chronic Obstructive Pulmonary Disease (COPD)  General Interventions   General Interventions Discussed/Reviewed General Interventions Reviewed, [employer] assessed needs in the home. Pt is working with landlord to assist with heating and carbon monoxide alarms. Discussed Legal Aid resources to assist. Family will follow up with [name] Discussed/Reviewed Mental Health Reviewed, Coping Strategies, Anxiety, Depression  [Strategies discussed to assist with stress/depression/anxiety management]       Follow up plan: Follow up call scheduled for 2-4 weeks    Encounter Outcome:  Pt. Visit Completed   [name], MSW, [name] Phone [phone] 10:45 AM

## 2023-02-07 NOTE — Patient Instructions (Signed)
Visit Information  Thank you for taking time to visit with me today. Please don't hesitate to contact me if I can be of assistance to you.   Following are the goals we discussed today:   Goals Addressed             This Visit's Progress    Obtain Assistance with Wheelchair   On track    Activities and task to complete in order to accomplish goals.   Keep all upcoming appointments discussed today Continue with compliance of taking medication prescribed by Doctor Implement healthy coping skills discussed to assist with management of symptoms Continue utilizing [facility] for transportation needs to medical appts Contact LCSW or RNCM at [facility] if you are interested in having a Legal Aid referral completed to assist with housing needs         Our next appointment is by telephone on 4/8 at 11 AM  Please call the care guide team at [phone] if you need to cancel or reschedule your appointment.   If you are experiencing a Mental Health or [place] or need someone to talk to, please call the Suicide and Crisis Lifeline: 988 call 911   Patient verbalizes understanding of instructions and care plan provided today and agrees to view in [place]. Active MyChart status and patient understanding of how to access instructions and care plan via MyChart confirmed with patient.     Christa See, MSW, [email or web address] Phone [phone] 10:46 AM

## 2023-02-08 ENCOUNTER — Ambulatory Visit: Payer: Self-pay

## 2023-02-08 NOTE — Patient Outreach (Signed)
  Care Coordination   02/08/2023 Name: TAYLORE GRINDSTAFF MRN: [id number] DOB: 06/08/1961   Care Coordination Outreach Attempts:  An unsuccessful telephone outreach was attempted for a scheduled appointment today.  Follow Up Plan:  Additional outreach attempts will be made to offer the patient care coordination information and services.   Encounter Outcome:  No Answer   Care Coordination Interventions:  No, not indicated    Barb Merino, RN, BSN, CCM Care Management Coordinator [facility] Care Management Direct Phone: [phone]

## 2023-02-09 ENCOUNTER — Ambulatory Visit: Payer: 59 | Attending: Family Medicine

## 2023-02-09 VITALS — Ht 64.0 in | Wt 220.0 lb

## 2023-02-09 DIAGNOSIS — Z0000 Encounter for general adult medical examination without abnormal findings: Secondary | ICD-10-CM

## 2023-02-09 NOTE — Progress Notes (Signed)
I connected with  [name] on 02/09/23 by a audio enabled telemedicine application and verified that I am speaking with the correct person using two identifiers.  Patient Location: Home  Provider Location: Office/Clinic  I discussed the limitations of evaluation and management by telemedicine. The patient expressed understanding and agreed to proceed.  Subjective:   [name] is a 62 y.o. female who presents for an Initial Medicare Annual Wellness Visit.  Review of Systems     Cardiac Risk Factors include: advanced age (>28men, >18 women);diabetes mellitus;dyslipidemia;hypertension;obesity (BMI >30kg/m2)     Objective:    Today's Vitals   02/09/23 1556 02/09/23 1557  Weight: 220 lb (99.8 kg)   Height: 5\' 4"  (1.626 m)   PainSc:  10-Worst pain ever   Body mass index is 37.76 kg/m.     02/09/2023    4:06 PM 01/23/2023    9:28 PM 11/24/2022    2:43 PM 04/19/2022    5:00 PM 04/17/2022   11:29 PM 12/17/2021   10:28 PM 10/27/2021    1:28 PM  Advanced Directives  Does Patient Have a Medical Advance Directive? No No No No No No No  Would patient like information on creating a medical advance directive?   No - Patient declined Yes (Inpatient - patient defers creating a medical advance directive at this time - Information given)   No - Patient declined    Current Medications (verified) Outpatient Encounter Medications as of 02/09/2023  Medication Sig   ACCU-CHEK GUIDE test strip USE TO CHECK BLOOD SUGAR FOUR TIMES DAILY   acetaminophen (TYLENOL) 500 MG tablet Take 1,000 mg by mouth every 6 (six) hours as needed for headache (pain).   albuterol (VENTOLIN HFA) 108 (90 Base) MCG/ACT inhaler USE 2 PUFFS BY MOUTH EVERY FOUR HOURS, AS NEEDED, FOR COUGHING/WHEEZING   allopurinol (ZYLOPRIM) 100 MG tablet TAKE 1 TABLET (100 MG TOTAL) BY MOUTH DAILY(AM)   amiodarone (PACERONE) 200 MG tablet TAKE 1/2 TABLET (100 MG TOTAL) BY MOUTH DAILY (AM)   amLODipine (NORVASC) 10 MG tablet Take 1  tablet (10 mg total) by mouth every morning.   Ascorbic Acid (VITAMIN C PO) Take 1 tablet by mouth every morning.   Aspirin-Salicylamide-Caffeine (BC HEADACHE POWDER PO) Take 1 packet by mouth 4 (four) times daily as needed (pain/headache).   budesonide-formoterol (SYMBICORT) 160-4.5 MCG/ACT inhaler Inhale 2 puffs into the lungs 2 (two) times daily.   buPROPion (WELLBUTRIN SR) 150 MG 12 hr tablet Take 1 tablet (150 mg total) by mouth 2 (two) times daily.   Cholecalciferol (VITAMIN D3 PO) Take 1 capsule by mouth every morning.   colchicine 0.6 MG tablet TAKE 2 TABS (1.2 MG) AT THE ONSET OF A GOUT FLARE, MAY TAKE 1 TAB (0.6 MG) IN 1 HOUR IF SYMPTOMS   Dulaglutide (TRULICITY) A999333 0000000 SOPN Inject 0.75 mg into the skin every Friday.   DULoxetine (CYMBALTA) 60 MG capsule TAKE 1 CAPSULE (60 MG TOTAL) BY MOUTH DAILY. FOR DIABETIC NEUROPATHY (AM)   EASY COMFORT PEN NEEDLES 31G X 5 MM MISC USE FOUR TIMES A DAY FOR INSULIN ADMINISTRATION   ELIQUIS 5 MG TABS tablet TAKE 1 TABLET (5 MG TOTAL) BY MOUTH 2 (TWO) TIMES DAILY (AM+BEDTIME)   empagliflozin (JARDIANCE) 10 MG TABS tablet Take 1 tablet (10 mg total) by mouth daily. TAKE 1 TABLET before breakfast daily   ferrous sulfate (FEROSUL) 325 (65 FE) MG tablet TAKE 1 TABLET (324 MG TOTAL) BY MOUTH 2 (TWO) TIMES DAILY AS NEEDED. (AM+BEDTIME)  folic acid (FOLVITE) 1 MG tablet TAKE 1 TABLET (1 MG TOTAL) BY MOUTH DAILY (AM)   hydrALAZINE (APRESOLINE) 50 MG tablet TAKE 1 & 1/2 TABLETS (75 MG TOTAL) BY MOUTH 3 (THREE) TIMES DAILY (AM+NOON+BEDTIME)   ibuprofen (ADVIL) 600 MG tablet Take 1 tablet by mouth as needed.   icosapent Ethyl (VASCEPA) 1 g capsule Take 2 capsules (2 g total) by mouth 2 (two) times daily.   insulin lispro (HUMALOG) 100 UNIT/ML KwikPen Inject 10 Units into the skin 3 (three) times daily with meals.   LANTUS SOLOSTAR 100 UNIT/ML Solostar Pen Inject 37 Units into the skin daily. (Patient taking differently: Inject 37 Units into the skin at  bedtime.)   levothyroxine (SYNTHROID) 50 MCG tablet TAKE 1 TABLET (50 MCG TOTAL) BY MOUTH DAILY BEFORE BREAKFAST (AM)   losartan (COZAAR) 25 MG tablet TAKE 2 TABLETS (50 MG TOTAL) BY MOUTH DAILY (AM)   Omega-3 Fatty Acids (FISH OIL PO) Take 1 capsule by mouth every morning.   ondansetron (ZOFRAN) 4 MG tablet Take 1 tablet (4 mg total) by mouth every 8 (eight) hours as needed for nausea or vomiting.   pantoprazole (PROTONIX) 40 MG tablet TAKE 1 TABLET (40 MG TOTAL) BY MOUTH DAILY(AM)   potassium chloride SA (KLOR-CON M) 20 MEQ tablet Take 1 tablet (20 mEq total) by mouth 2 (two) times daily.   rosuvastatin (CRESTOR) 40 MG tablet Take 1 tablet (40 mg total) by mouth daily.   Semaglutide,0.25 or 0.5MG /DOS, (OZEMPIC, 0.25 OR 0.5 MG/DOSE,) 2 MG/1.5ML SOPN Inject 0.5 mg into the skin once a week.   spironolactone (ALDACTONE) 25 MG tablet TAKE 0.5 TABLET (12.5 MG TOTAL) BY MOUTH DAILY (AM)   torsemide (DEMADEX) 100 MG tablet TAKE 1 TABLET (100 MG TOTAL) BY MOUTH 2 (TWO) TIMES DAILY. (AM+BEDTIME)   triamcinolone 0.1% oint-Cerave equivalent lotion 1:1 mixture Apply topically 2 (two) times daily as needed.   Continuous Blood Gluc Sensor (FREESTYLE LIBRE 2 SENSOR) MISC USE 1 (ONE) EACH EVERY 2 WEEKS (Patient not taking: Reported on 02/09/2023)   doxycycline (VIBRAMYCIN) 100 MG capsule Take 1 capsule (100 mg total) by mouth 2 (two) times daily. (Patient not taking: Reported on 02/09/2023)   [DISCONTINUED] tiotropium (SPIRIVA HANDIHALER) 18 MCG inhalation capsule Place 1 capsule (18 mcg total) into inhaler and inhale every morning. (Patient not taking: Reported on 02/09/2021)   No facility-administered encounter medications on file as of 02/09/2023.    Allergies (verified) Gabapentin and Lyrica [pregabalin]   History: Past Medical History:  Diagnosis Date   Alcohol abuse    Alcoholic cirrhosis ([place])    Anemia    Anxiety    Arthritis    "knees" (11/26/2018)   B12 deficiency    CAD (coronary artery  disease)    a. 11/10/2014 Cath: LM nl, LAD min irregs, D1 30 ost, D2 50d, LCX 64m, OM1 80 p/m (1.5 mm vessel), OM2 58m, RCA nondom 41m-->med rx.. Demand ischemia in the setting of rapid a-fib.   Cardiomyopathy ([place])    Carotid artery disease ([place])    a. 01/2015 Carotid Angio: RICA [id number], LICA [id number]; b. [id number] s/p L CEA; c. 05/2019 Carotid U/S: RICA 100. RECA >50. LICA 123456.   Cellulitis    lower extremities   CHF (congestive heart failure) ([name])    Chronic combined systolic and diastolic CHF (congestive heart failure) ([place])    a. 10/2014 Echo: EF 40-45%; b. 10/2018 Echo: EF 45-50%, gr2 DD; c. 11/2019 Echo: EF 50%, mild LVH, gr2 DD (restrictive), antlat HK,  Nl RV fxn. Mild BAE. RVSP 59.66mmHg.   CKD (chronic kidney disease), stage III ([name])    Cocaine abuse ([name])    COPD (chronic obstructive pulmonary disease) ([name])    Critical lower limb ischemia ([name])    Depression    Diabetes mellitus without complication ([name])    Diabetic peripheral neuropathy ([name])    DVT (deep venous thrombosis) ([name])    Dyspnea    Elevated troponin    a. Chronic elevation.   GERD (gastroesophageal reflux disease)    Hyperlipemia    Hypertension    Hypokalemia    Hypomagnesemia    Hypothyroidism    Marijuana abuse    Narcotic abuse ([place])    Noncompliance    NSVT (nonsustained ventricular tachycardia) ([name])    Obesity    PAF (paroxysmal atrial fibrillation) ([name])    Paroxysmal atrial tachycardia    Peripheral arterial disease ([place])    a. 01/2015 Angio/PTA: RSFA 99 (atherectomy/pta) - 1 vessel runoff via diff dzs peroneal; b. 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   Pneumonia    "once or twice" (11/26/2018)   Poorly controlled type 2 diabetes mellitus ([place])    Renal disorder    Renal insufficiency    a. 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   Sleep apnea    "couldn't handle wearing the mask" (11/26/2018)   Symptomatic bradycardia    a. Avoid AV blocking agent per EP. Prev req temp wire in 2017.    Tobacco abuse    Past Surgical History:  Procedure Laterality Date   ABDOMINAL AORTOGRAM N/A 06/26/2019   Procedure: ABDOMINAL AORTOGRAM;  Surgeon: Wellington Hampshire, MD;  Location: [place] [facility];  Service: Cardiovascular;  Laterality: N/A;   ABDOMINAL AORTOGRAM W/LOWER EXTREMITY N/A 06/07/2021   Procedure: ABDOMINAL AORTOGRAM W/LOWER EXTREMITY;  Surgeon: [name], MD;  Location: [place] [facility];  Service: Cardiovascular;  Laterality: N/A;   AMPUTATION Right 06/14/2017   Procedure: Right foot transmetatarsal amputation;  Surgeon: Newt Minion, MD;  Location: [place];  Service: Orthopedics;  Laterality: Right;   AMPUTATION Left 06/28/2019   Procedure: AMPUTATION LEFT FIFTH TOE;  Surgeon: Rosetta Posner, MD;  Location: [place];  Service: Vascular;  Laterality: Left;   AMPUTATION TOE Right 04/28/2017   Procedure: AMPUTATION OF RIGHT SECOND RAY;  Surgeon: Newt Minion, MD;  Location: [place];  Service: Orthopedics;  Laterality: Right;   CARDIAC CATHETERIZATION     CARDIAC CATHETERIZATION N/A 01/12/2016   Procedure: Temporary Wire;  Surgeon: Minus Breeding, MD;  Location: [place] [facility];  Service: Cardiovascular;  Laterality: N/A;   CARDIOVERSION  ~ 02/2013   "twice"    CAROTID ANGIOGRAM N/A 01/15/2015   Procedure: CAROTID ANGIOGRAM;  Surgeon: [name], MD;  Location: [facility] CATH LAB;  Service: Cardiovascular;  Laterality: N/A;   DILATION AND CURETTAGE OF UTERUS  1988   ENDARTERECTOMY Left 02/19/2015   Procedure: LEFT CAROTID ENDARTERECTOMY ;  Surgeon: Serafina Mitchell, MD;  Location: [place];  Service: Vascular;  Laterality: Left;   FEMORAL-TIBIAL BYPASS GRAFT Left 06/28/2019   Procedure: BYPASS GRAFT LEFT LEG FEMORAL TO ANTERIOR TIBIAL ARTERY using LEFT GREATER SAPHENOUS VEIN;  Surgeon: Rosetta Posner, MD;  Location: [place];  Service: Vascular;  Laterality: Left;   LEFT HEART CATHETERIZATION WITH CORONARY ANGIOGRAM N/A 10/31/2014   Procedure: LEFT HEART CATHETERIZATION WITH CORONARY  ANGIOGRAM;  Surgeon: Burnell Blanks, MD;  Location: [facility] CATH LAB;  Service: Cardiovascular;  Laterality: N/A;   LOWER EXTREMITY ANGIOGRAM N/A 09/10/2013   Procedure: LOWER EXTREMITY ANGIOGRAM;  Surgeon: [name], MD;  Location: [facility] C[facility] LAB;  Service: Cardiovascular;  Laterality: N/A;   LOWER EXTREMITY ANGIOGRAM N/A 01/15/2015   Procedure: LOWER EXTREMITY ANGIOGRAM;  Surgeon: [name], MD;  Location: [facility] CATH LAB;  Service: Cardiovascular;  Laterality: N/A;   LOWER EXTREMITY ANGIOGRAPHY N/A 04/13/2017   Procedure: Lower Extremity Angiography;  Surgeon: [name], MD;  Location: [place] [facility];  Service: Cardiovascular;  Laterality: N/A;   LOWER EXTREMITY ANGIOGRAPHY Left 06/26/2019   Procedure: LOWER EXTREMITY ANGIOGRAPHY;  Surgeon: Wellington Hampshire, MD;  Location: [place] [facility];  Service: Cardiovascular;  Laterality: Left;   PERIPHERAL VASCULAR ATHERECTOMY Right 06/07/2021   Procedure: PERIPHERAL VASCULAR ATHERECTOMY;  Surgeon: [name], MD;  Location: [place] [facility];  Service: Cardiovascular;  Laterality: Right;   PERIPHERAL VASCULAR BALLOON ANGIOPLASTY Left 06/26/2019   Procedure: PERIPHERAL VASCULAR BALLOON ANGIOPLASTY;  Surgeon: Wellington Hampshire, MD;  Location: [place] [facility];  Service: Cardiovascular;  Laterality: Left;  unable to cross lt sfa occlusion   PERIPHERAL VASCULAR INTERVENTION  04/13/2017   Procedure: Peripheral Vascular Intervention;  Surgeon: [name], MD;  Location: [place] [facility];  Service: Cardiovascular;;   PRESSURE SENSOR/CARDIOMEMS N/A 02/05/2020   Procedure: PRESSURE SENSOR/CARDIOMEMS;  Surgeon: Larey Dresser, MD;  Location: [place] [facility];  Service: Cardiovascular;  Laterality: N/A;   VEIN HARVEST Left 06/28/2019   Procedure: LEFT LEG GREATER SAPHENOUS VEIN HARVEST;  Surgeon: Rosetta Posner, MD;  Location: [facility];  Service: Vascular;  Laterality: Left;   Family History  Problem Relation Age of Onset    Hypertension Mother    Diabetes Mother    Cancer Mother        breast, ovarian, colon   Clotting disorder Mother    Heart disease Mother    Heart attack Mother    Breast cancer Mother        in 58's   Hypertension Father    Heart disease Father    Emphysema Sister        smoked   Social History   Socioeconomic History   Marital status: Single    Spouse name: Not on file   Number of children: 1   Years of education: 12   Highest education level: 12th grade  Occupational History   Occupation: disabled  Tobacco Use   Smoking status: Former    Packs/day: 1.00    Years: 44.00    Additional pack years: 0.00    Total pack years: 44.00    Types: E-cigarettes, Cigarettes   Smokeless tobacco: Former    Types: Snuff  Vaping Use   Vaping Use: Former   Devices: 11/26/2018 "stopped months ago"  Substance and Sexual Activity   Alcohol use: Not Currently    Comment: occ   Drug use: Not Currently    Types: "Crack" cocaine, Marijuana, Oxycodone   Sexual activity: Not Currently  Other Topics Concern   Not on file  Social History Narrative   ** Merged History Encounter **       Lives in [name], in [place] with sister.  They are looking to move but don't have a place to go yet.     Social Determinants of Health   Financial Resource Strain: Low Risk  (02/09/2023)   Overall Financial Resource Strain (CARDIA)    Difficulty of Paying Living  Expenses: Not hard at all  Food Insecurity: No Food Insecurity (02/09/2023)   Hunger Vital Sign    Worried About Running Out of Food in the Last Year: Never true    Ran Out of Food in the Last Year: Never true  Transportation Needs: No Transportation Needs (02/09/2023)   PRAPARE - Hydrologist (Medical): No    Lack of Transportation (Non-Medical): No  Physical Activity: Inactive (02/09/2023)   Exercise Vital Sign    Days of Exercise per Week: 0 days    Minutes of Exercise per Session: 0 min  Stress: No Stress Concern  Present (02/09/2023)   [place]    Feeling of Stress : Not at all  Social Connections: Not on file    Tobacco Counseling Counseling given: Not Answered   Clinical Intake:  Pre-visit preparation completed: Yes  Pain : 0-10 Pain Score: 10-Worst pain ever Pain Type: Chronic pain Pain Location: Foot Pain Orientation: Left, Right Pain Descriptors / Indicators: Numbness, Pins and needles Pain Onset: More than a month ago Pain Frequency: Constant     Nutritional Status: BMI > 30  Obese Nutritional Risks: Nausea/ vomitting/ diarrhea Diabetes: Yes  How often do you need to have someone help you when you read instructions, pamphlets, or other written materials from your doctor or pharmacy?: 1 - Never What is the last grade level you completed in school?: 12 th grade  Diabetic? Yes Nutrition Risk Assessment:  Has the patient had any N/V/D within the last 2 months?  No  Does the patient have any non-healing wounds?  No  Has the patient had any unintentional weight loss or weight gain?  No   Diabetes:  Is the patient diabetic?  Yes  If diabetic, was a CBG obtained today?  No  Did the patient bring in their glucometer from home?  No  How often do you monitor your CBG's? Three times daily.   Financial Strains and Diabetes Management:  Are you having any financial strains with the device, your supplies or your medication? No .  Does the patient want to be seen by Chronic Care Management for management of their diabetes?  No  Would the patient like to be referred to a Nutritionist or for Diabetic Management?  No   Diabetic Exams:  Diabetic Eye Exam: Completed 08/15/2022 Diabetic Foot Exam: Completed 07/29/2022   Interpreter Needed?: No  Information entered by :: NAllen LPN   Activities of Daily Living    02/09/2023    4:08 PM 04/18/2022   10:06 PM  In your present state of health, do you have any  difficulty performing the following activities:  Hearing? 0 0  Vision? 1 0  Difficulty concentrating or making decisions? 1 0  Walking or climbing stairs? 1 1  Dressing or bathing? 0 0  Doing errands, shopping? 0 0  Preparing Food and eating ? N   Using the Toilet? N   In the past six months, have you accidently leaked urine? Y   Do you have problems with loss of bowel control? Y   Managing your Medications? N   Managing your Finances? N   Housekeeping or managing your Housekeeping? N     Patient Care Team: Charlott Rakes, MD as PCP - General (Family Medicine) [name], MD as PCP - Cardiology (Cardiology) [name], MD as PCP - Electrophysiology (Cardiology) [name], MD as Consulting Physician (Cardiology) Melvyn Novas,  Christena Deem, MD as Consulting Physician (Pulmonary Disease) System, Provider Not In [place], Claudette Stapler, RN as [place] Management Rebekah Chesterfield, LCSW as Education officer, museum (Licensed Holiday representative)  Indicate any recent [employer] you may have received from other than [facility] providers in the past year (date may be approximate).     Assessment:   This is a routine wellness examination for [place].  Hearing/Vision screen Vision Screening - Comments:: No regular eye exams  Dietary issues and exercise activities discussed: Current Exercise Habits: The patient does not participate in regular exercise at present   Goals Addressed             This Visit's Progress    Patient Stated       02/09/2023, wants to weigh 150 pounds       Depression Screen    02/09/2023    4:07 PM 07/07/2022   11:31 AM 07/14/2021    9:48 AM 01/28/2020    9:35 AM 10/07/2015    2:42 PM 07/21/2015   12:18 PM 07/14/2015   12:24 PM  PHQ 2/9 Scores  PHQ - 2 Score 0 3  2 0 6 6  PHQ- 9 Score  8  8   18   Exception Documentation   Patient refusal        Fall Risk    02/09/2023    4:06 PM 10/10/2022   11:09 AM 07/07/2022   11:31 AM  09/23/2021    4:04 PM 08/11/2021    2:39 PM  [place] in the past year? 1 0 0 1 0  Comment tripped over things      Number falls in past yr: 1 0 0 1 0  Injury with Fall? 0 0 0 0 0  Risk for fall due to : History of fall(s);Impaired balance/gait;Impaired mobility;Medication side effect No Fall Risks No Fall Risks No Fall Risks;Other (Comment)   Follow up Falls prevention discussed;Education provided;Falls evaluation completed        FALL RISK PREVENTION PERTAINING TO THE HOME:  Any stairs in or around the home? Yes  If so, are there any without handrails? No  Home free of loose throw rugs in walkways, pet beds, electrical cords, etc? Yes  Adequate lighting in your home to reduce risk of falls? Yes   ASSISTIVE DEVICES UTILIZED TO PREVENT FALLS:  Life alert? No  Use of a cane, walker or w/c? Yes  Grab bars in the bathroom? No  Shower chair or bench in shower? Yes  Elevated toilet seat or a handicapped toilet? No   TIMED UP AND GO:  Was the test performed? No .      Cognitive Function:        02/09/2023    4:12 PM  6CIT Screen  What Year? 4 points  What month? 0 points  What time? 0 points  Count back from 20 0 points  Months in reverse 4 points  Repeat phrase 6 points  Total Score 14 points    Immunizations Immunization History  Administered Date(s) Administered   Influenza Split 08/14/2014   Influenza,inj,Quad PF,6+ Mos 08/21/2013, 01/05/2021, 08/11/2021, 10/10/2022   Moderna SARS-COV2 Booster Vaccination 10/24/2020   Moderna Sars-Covid-2 Vaccination 01/16/2020, 02/13/2020   PPD Test 06/17/2017   Pneumococcal Polysaccharide-23 02/20/2015   Tdap 04/13/2018, 04/14/2019   Zoster Recombinat (Shingrix) 10/24/2020, 12/25/2020    TDAP status: Up to date  Flu Vaccine status: Up to date  Pneumococcal vaccine status: Up to  date  Covid-19 vaccine status: Completed vaccines  Qualifies for Shingles Vaccine? Yes   Zostavax completed No   Shingrix  Completed?: Yes  Screening Tests Health Maintenance  Topic Date Due   Diabetic kidney evaluation - Urine ACR  Never done   COLONOSCOPY (Pts 45-85yrs Insurance coverage will need to be confirmed)  Never done   PAP SMEAR-Modifier  07/02/2016   Lung Cancer Screening  07/03/2016   MAMMOGRAM  03/09/2020   COVID-19 Vaccine (3 - Moderna risk series) 11/21/2020   Medicare Annual Wellness (AWV)  01/20/2021   HEMOGLOBIN A1C  03/14/2023   FOOT EXAM  07/30/2023   OPHTHALMOLOGY EXAM  08/16/2023   Diabetic kidney evaluation - eGFR measurement  01/23/2024   DTaP/Tdap/Td (3 - Td or Tdap) 04/13/2029   INFLUENZA VACCINE  Completed   Hepatitis C Screening  Completed   HIV Screening  Completed   Zoster Vaccines- Shingrix  Completed   HPV VACCINES  Aged Out    Health Maintenance  Health Maintenance Due  Topic Date Due   Diabetic kidney evaluation - Urine ACR  Never done   COLONOSCOPY (Pts 45-18yrs Insurance coverage will need to be confirmed)  Never done   PAP SMEAR-Modifier  07/02/2016   Lung Cancer Screening  07/03/2016   MAMMOGRAM  03/09/2020   COVID-19 Vaccine (3 - Moderna risk series) 11/21/2020   Medicare Annual Wellness (AWV)  01/20/2021    Colorectal cancer screening: Type of screening: Colonoscopy. Completed 2023. Repeat every 10 years  Mammogram status: due  Bone Density status: n/a  Lung Cancer Screening: (Low Dose CT Chest recommended if Age 51-80 years, 30 pack-year currently smoking OR have quit w/in 15years.) does not qualify.   Lung Cancer Screening Referral: no  Additional Screening:  Hepatitis C Screening: does qualify; Completed 10/10/2022  Vision Screening: Recommended annual ophthalmology exams for early detection of glaucoma and other disorders of the eye. Is the patient up to date with their annual eye exam?  Yes  Who is the provider or what is the name of the office in which the patient attends annual eye exams? Can't remember name If pt is not established  with a provider, would they like to be referred to a provider to establish care? No .   Dental Screening: Recommended annual dental exams for proper oral hygiene  Community Resource Referral / Chronic Care Management: CRR required this visit?  No   CCM required this visit?  No      Plan:     I have personally reviewed and noted the following in the patient's chart:   Medical and social history Use of alcohol, tobacco or illicit drugs  Current medications and supplements including opioid prescriptions. Patient is not currently taking opioid prescriptions. Functional ability and status Nutritional status Physical activity Advanced directives List of other physicians Hospitalizations, surgeries, and ER visits in previous 12 months Vitals Screenings to include cognitive, depression, and falls Referrals and appointments  In addition, I have reviewed and discussed with patient certain preventive protocols, quality metrics, and best practice recommendations. A written personalized care plan for preventive services as well as general preventive health recommendations were provided to patient.     Kellie Simmering, LPN   [id number]   Nurse Notes: none  Due to this being a virtual visit, the after visit summary with patients personalized plan was offered to patient via mail or my-chart.  to pick up at office at next visit

## 2023-02-09 NOTE — Patient Instructions (Signed)
[name] , Thank you for taking time to come for your Medicare Wellness Visit. I appreciate your ongoing commitment to your health goals. Please review the following plan we discussed and let me know if I can assist you in the future.   These are the goals we discussed:  Goals      Blood Pressure < 140/90     HEMOGLOBIN A1C < 7.0     Obtain Assistance with Wheelchair     Activities and task to complete in order to accomplish goals.   Keep all upcoming appointments discussed today Continue with compliance of taking medication prescribed by Doctor Implement healthy coping skills discussed to assist with management of symptoms Continue utilizing [facility] for transportation needs to medical appts Contact LCSW or RNCM at [facility] if you are interested in having a Legal Aid referral completed to assist with housing needs      Patient Stated     02/09/2023, wants to weigh 150 pounds     To contact kidney doctor for re-establishment of care     Care Coordination Interventions: Assessed the Patient understanding of chronic kidney disease    Evaluation of current treatment plan related to chronic kidney disease self management and patient's adherence to plan as established by provider      Reviewed prescribed diet increase water intake to 48-64 oz daily unless otherwise directed  Reviewed medications with patient and discussed importance of compliance    Advised patient to discuss Nephrology referral  with provider    Discussed the impact of chronic kidney disease on daily life and mental health and acknowledged and normalized feelings of disempowerment, fear, and frustration    Assessed social determinant of health barriers    Provided education on kidney disease progression    Educated patient on stages of CKD and the importance of getting re-established with kidney specialist to help management stage IV CKD Determined patient was established with [facility], provided patient with the contact  number and encouraged patient to schedule a follow up ASAP Mailed printed educational materials related to Eating Right with Chronic Kidney diease        To get A1c <7.0 %     Care Coordination Interventions: Provided education to patient about basic DM disease process Reviewed medications with patient and discussed importance of medication adherence Counseled on importance of regular laboratory monitoring as prescribed Provided patient with written educational materials related to hypo and hyperglycemia and importance of correct treatment Advised patient, providing education and rationale, to check cbg daily before meals and at bedtime and record, calling PCP for findings outside established parameters Review of patient status, including review of consultants reports, relevant laboratory and other test results, and medications completed Counseled on Diabetic diet, my plate method Printed educational materials related to dm management         To have wound healing to right great toe w/o complications     Care Coordination Interventions: Evaluation of current treatment plan related to wound to right foot near right great toe and patient's adherence to plan as established by provider Determined patient has an open, drainage ulcer to her right upper foot near her right great toe Assessed for signs/symptoms of infection and when to seek medical care Determined patient lives with her sister who helps inspect this area daily Discussed upcoming scheduled appointments with Podiatry and the [place] [facility], patient has transportation via health plan Educated patient on potential complication of wound healing secondary to lower leg  edema and uncontrolled dm            This is a list of the screening recommended for you and due dates:  Health Maintenance  Topic Date Due   Yearly kidney health urinalysis for diabetes  Never done   Colon Cancer Screening  Never done   Pap Smear   07/02/2016   Screening for Lung Cancer  07/03/2016   Mammogram  03/09/2020   COVID-19 Vaccine (3 - Moderna risk series) 11/21/2020   Hemoglobin A1C  03/14/2023   Complete foot exam   07/30/2023   Eye exam for diabetics  08/16/2023   Yearly kidney function blood test for diabetes  01/23/2024   Medicare Annual Wellness Visit  02/09/2024   DTaP/Tdap/Td vaccine (3 - Td or Tdap) 04/13/2029   Flu Shot  Completed   Hepatitis C Screening: USPSTF Recommendation to screen - Ages 18-79 yo.  Completed   HIV Screening  Completed   Zoster (Shingles) Vaccine  Completed   HPV Vaccine  Aged Out    Advanced directives: Advance directive discussed with you today.   Conditions/risks identified: none  Next appointment: Follow up in one year for your annual wellness visit.   Preventive Care 40-64 Years, Female Preventive care refers to lifestyle choices and visits with your health care provider that can promote health and wellness. What does preventive care include? A yearly physical exam. This is also called an annual well check. Dental exams once or twice a year. Routine eye exams. Ask your health care provider how often you should have your eyes checked. Personal lifestyle choices, including: Daily care of your teeth and gums. Regular physical activity. Eating a healthy diet. Avoiding tobacco and drug use. Limiting alcohol use. Practicing safe sex. Taking low-dose aspirin daily starting at age 61. Taking vitamin and mineral supplements as recommended by your health care provider. What happens during an annual well check? The services and screenings done by your health care provider during your annual well check will depend on your age, overall health, lifestyle risk factors, and family history of disease. Counseling  Your health care provider may ask you questions about your: Alcohol use. Tobacco use. Drug use. Emotional well-being. Home and relationship well-being. Sexual  activity. Eating habits. Work and work Statistician. Method of birth control. Menstrual cycle. Pregnancy history. Screening  You may have the following tests or measurements: Height, weight, and BMI. Blood pressure. Lipid and cholesterol levels. These may be checked every 5 years, or more frequently if you are over 34 years old. Skin check. Lung cancer screening. You may have this screening every year starting at age 65 if you have a 30-pack-year history of smoking and currently smoke or have quit within the past 15 years. Fecal occult blood test (FOBT) of the stool. You may have this test every year starting at age 50. Flexible sigmoidoscopy or colonoscopy. You may have a sigmoidoscopy every 5 years or a colonoscopy every 10 years starting at age 55. Hepatitis C blood test. Hepatitis B blood test. Sexually transmitted disease (STD) testing. Diabetes screening. This is done by checking your blood sugar (glucose) after you have not eaten for a while (fasting). You may have this done every 1-3 years. Mammogram. This may be done every 1-2 years. Talk to your health care provider about when you should start having regular mammograms. This may depend on whether you have a family history of breast cancer. BRCA-related cancer screening. This may be done if you have a family history  of breast, ovarian, tubal, or peritoneal cancers. Pelvic exam and Pap test. This may be done every 3 years starting at age 6. Starting at age 48, this may be done every 5 years if you have a Pap test in combination with an HPV test. Bone density scan. This is done to screen for osteoporosis. You may have this scan if you are at high risk for osteoporosis. Discuss your test results, treatment options, and if necessary, the need for more tests with your health care provider. Vaccines  Your health care provider may recommend certain vaccines, such as: Influenza vaccine. This is recommended every year. Tetanus, diphtheria,  and acellular pertussis (Tdap, Td) vaccine. You may need a Td booster every 10 years. Zoster vaccine. You may need this after age 37. Pneumococcal 13-valent conjugate (PCV13) vaccine. You may need this if you have certain conditions and were not previously vaccinated. Pneumococcal polysaccharide (PPSV23) vaccine. You may need one or two doses if you smoke cigarettes or if you have certain conditions. Talk to your health care provider about which screenings and vaccines you need and how often you need them. This information is not intended to replace advice given to you by your health care provider. Make sure you discuss any questions you have with your health care provider. Document Released: 11/27/2015 Document Revised: 07/20/2016 Document Reviewed: 09/01/2015 Elsevier Interactive Patient Education  2017 [employer] Prevention in the Home Falls can cause injuries. They can happen to people of all ages. There are many things you can do to make your home safe and to help prevent falls. What can I do on the outside of my home? Regularly fix the edges of walkways and driveways and fix any cracks. Remove anything that might make you trip as you walk through a door, such as a raised step or threshold. Trim any bushes or trees on the path to your home. Use bright outdoor lighting. Clear any walking paths of anything that might make someone trip, such as rocks or tools. Regularly check to see if handrails are loose or broken. Make sure that both sides of any steps have handrails. Any raised decks and porches should have guardrails on the edges. Have any leaves, snow, or ice cleared regularly. Use sand or salt on walking paths during winter. Clean up any spills in your garage right away. This includes oil or grease spills. What can I do in the bathroom? Use night lights. Install grab bars by the toilet and in the tub and shower. Do not use towel bars as grab bars. Use non-skid mats or  decals in the tub or shower. If you need to sit down in the shower, use a plastic, non-slip stool. Keep the floor dry. Clean up any water that spills on the floor as soon as it happens. Remove soap buildup in the tub or shower regularly. Attach bath mats securely with double-sided non-slip rug tape. Do not have throw rugs and other things on the floor that can make you trip. What can I do in the bedroom? Use night lights. Make sure that you have a light by your bed that is easy to reach. Do not use any sheets or blankets that are too big for your bed. They should not hang down onto the floor. Have a firm chair that has side arms. You can use this for support while you get dressed. Do not have throw rugs and other things on the floor that can make you trip.  What can I do in the kitchen? Clean up any spills right away. Avoid walking on wet floors. Keep items that you use a lot in easy-to-reach places. If you need to reach something above you, use a strong step stool that has a grab bar. Keep electrical cords out of the way. Do not use floor polish or wax that makes floors slippery. If you must use wax, use non-skid floor wax. Do not have throw rugs and other things on the floor that can make you trip. What can I do with my stairs? Do not leave any items on the stairs. Make sure that there are handrails on both sides of the stairs and use them. Fix handrails that are broken or loose. Make sure that handrails are as long as the stairways. Check any carpeting to make sure that it is firmly attached to the stairs. Fix any carpet that is loose or worn. Avoid having throw rugs at the top or bottom of the stairs. If you do have throw rugs, attach them to the floor with carpet tape. Make sure that you have a light switch at the top of the stairs and the bottom of the stairs. If you do not have them, ask someone to add them for you. What else can I do to help prevent falls? Wear shoes that: Do not  have high heels. Have rubber bottoms. Are comfortable and fit you well. Are closed at the toe. Do not wear sandals. If you use a stepladder: Make sure that it is fully opened. Do not climb a closed stepladder. Make sure that both sides of the stepladder are locked into place. Ask someone to hold it for you, if possible. Clearly mark and make sure that you can see: Any grab bars or handrails. First and last steps. Where the edge of each step is. Use tools that help you move around (mobility aids) if they are needed. These include: Canes. Walkers. Scooters. Crutches. Turn on the lights when you go into a dark area. Replace any light bulbs as soon as they burn out. Set up your furniture so you have a clear path. Avoid moving your furniture around. If any of your floors are uneven, fix them. If there are any pets around you, be aware of where they are. Review your medicines with your doctor. Some medicines can make you feel dizzy. This can increase your chance of falling. Ask your doctor what other things that you can do to help prevent falls. This information is not intended to replace advice given to you by your health care provider. Make sure you discuss any questions you have with your health care provider. Document Released: 08/27/2009 Document Revised: 04/07/2016 Document Reviewed: 12/05/2014 Elsevier Interactive Patient Education  2017 [employer].

## 2023-02-10 NOTE — Telephone Encounter (Signed)
[name] called to f/u to see when to recv the fax, adv per notes from 3/25 the script for Continuous Blood Gluc Sensor (FREESTYLE LIBRE 2 SENSOR) would be sent after patients visit on 4/3. [phone]; [phone]

## 2023-02-15 ENCOUNTER — Encounter: Payer: Self-pay | Admitting: Licensed Clinical Social Worker

## 2023-02-15 ENCOUNTER — Ambulatory Visit: Payer: Medicare Other | Admitting: Family Medicine

## 2023-02-15 NOTE — Progress Notes (Signed)
**Note Karla-Identified via Obfuscation** [name], [name] ([id number]) [phone].pdf Page 1 of 6 Visit Report for 02/06/2023 Allergy List Details Patient Name: Date of Service: [name] 02/06/2023 8:00 A M Medical Record Number: [id number] Patient Account Number: [id number] Date of Birth/Sex: Treating RN: Jun 21, 1961 (62 y.o. [name], Lauren Primary Care Raina Sole: Charlott Rakes Other Clinician: Referring Shrika Milos: Treating Dorreen Valiente/Extender: Caroline More, [name] Weeks in Treatment: 0 Allergies Active Allergies gabapentin Reaction: Shakes Severity: Moderate Lyrica Allergy Notes Electronic Signature(s) Signed: 02/15/2023 4:26:45 PM By: [name] RN Entered By: [name] on 02/06/2023 08:12:56 -------------------------------------------------------------------------------- Arrival Information Details Patient Name: Date of Service: Karla Corwin, Karla [name] 02/06/2023 8:00 A M Medical Record Number: [id number] Patient Account Number: [id number] Date of Birth/Sex: Treating RN: 01/29/1961 (62 y.o. [name], Lauren Primary Care Akyia Borelli: Charlott Rakes Other Clinician: Referring Anuradha Chabot: Treating Mlissa Tamayo/Extender: Durward Fortes Weeks in Treatment: 0 Visit Information Patient Arrived: Walker Arrival Time: 08:12 Accompanied By: self Transfer Assistance: Manual Patient Identification Verified: Yes Secondary Verification Process Completed: Yes Patient Has Alerts: Yes Patient Alerts: Patient on Blood Thinner ABI's 02/24: R:0.57L:1.04 TBI's: L: 1 R: Amputation History Since Last Visit Added or deleted any medications: No Any new allergies or adverse reactions: No Had a fall or experienced change in activities of daily living that may affect risk of falls: No Signs or symptoms of abuse/neglect since last visito No Hospitalized since last visit: No Implantable device outside of the clinic excluding cellular tissue based products placed in the  center since last visit: No Electronic Signature(s) Signed: 02/15/2023 4:26:45 PM By: [name] RN Entered By: [name] on 02/06/2023 08:38:16 -------------------------------------------------------------------------------- Clinic Level of Care Assessment Details Patient Name: Date of Service: Karla LEY, Karla [name] 02/06/2023 8:00 A M Medical Record Number: [id number] Patient Account Number: [id number] Date of Birth/Sex: Treating RN: 06-Jul-1961 (62 y.o. [name], Lauren Primary Care Alem Fahl: Charlott Rakes Other Clinician: Referring Leonila Speranza: Treating Cedrick Partain/Extender: Durward Fortes Weeks in Treatment: 0 Clinic Level of Care Assessment Items YONNA, DIPPOLD ([id number]) [id number] Page 2 of 6 TOOL 4 Quantity Score X- 1 0 Use when only an EandM is performed on FOLLOW-UP visit ASSESSMENTS - Nursing Assessment / Reassessment X- 1 10 Reassessment of Co-morbidities (includes updates in patient status) X- 1 5 Reassessment of Adherence to Treatment Plan ASSESSMENTS - Wound and Skin A ssessment / Reassessment []  - 0 Simple Wound Assessment / Reassessment - one wound []  - 0 Complex Wound Assessment / Reassessment - multiple wounds []  - 0 Dermatologic / Skin Assessment (not related to wound area) ASSESSMENTS - Focused Assessment X- 2 5 Circumferential Edema Measurements - multi extremities []  - 0 Nutritional Assessment / Counseling / Intervention []  - 0 Lower Extremity Assessment (monofilament, tuning fork, pulses) []  - 0 Peripheral Arterial Disease Assessment (using hand held doppler) ASSESSMENTS - Ostomy and/or Continence Assessment and Care []  - 0 Incontinence Assessment and Management []  - 0 Ostomy Care Assessment and Management (repouching, etc.) PROCESS - Coordination of Care X - Simple Patient / Family Education for ongoing care 1 15 []  - 0 Complex (extensive) Patient / Family Education for ongoing care X- 1  10 Staff obtains Programmer, systems, Records, T Results / Process Orders est []  - 0 Staff telephones HHA, Nursing Homes / Clarify orders / etc []  - 0 Routine Transfer to another Facility (non-emergent condition) []  - 0 Routine Hospital Admission (non-emergent condition) X- 1 15 New Admissions / Biomedical engineer / Ordering NPWT Apligraf, etc. , []  - 0 Emergency Hospital  Admission (emergent condition) X- 1 10 Simple Discharge Coordination []  - 0 Complex (extensive) Discharge Coordination PROCESS - Special Needs []  - 0 Pediatric / Minor Patient Management []  - 0 Isolation Patient Management []  - 0 Hearing / Language / Visual special needs []  - 0 Assessment of Community assistance (transportation, D/C planning, etc.) []  - 0 Additional assistance / Altered mentation []  - 0 Support Surface(s) Assessment (bed, cushion, seat, etc.) INTERVENTIONS - Wound Cleansing / Measurement []  - 0 Simple Wound Cleansing - one wound []  - 0 Complex Wound Cleansing - multiple wounds []  - 0 Wound Imaging (photographs - any number of wounds) []  - 0 Wound Tracing (instead of photographs) []  - 0 Simple Wound Measurement - one wound []  - 0 Complex Wound Measurement - multiple wounds INTERVENTIONS - Wound Dressings []  - 0 Small Wound Dressing one or multiple wounds []  - 0 Medium Wound Dressing one or multiple wounds []  - 0 Large Wound Dressing one or multiple wounds [name], [name] ([id number].pdf Page 3 of 6 []  - 0 Application of Medications - topical []  - 0 Application of Medications - injection INTERVENTIONS - Miscellaneous []  - 0 External ear exam []  - 0 Specimen Collection (cultures, biopsies, blood, body fluids, etc.) []  - 0 Specimen(s) / Culture(s) sent or taken to Lab for analysis []  - 0 Patient Transfer (multiple staff / Civil Service fast streamer / Similar devices) []  - 0 Simple Staple / Suture removal (25 or less) []  - 0 Complex Staple / Suture removal  (26 or more) []  - 0 Hypo / Hyperglycemic Management (close monitor of Blood Glucose) []  - 0 Ankle / Brachial Index (ABI) - Karla not check if billed separately X- 1 5 Vital Signs Has the patient been seen at the hospital within the last three years: Yes Total Score: 80 Level Of Care: New/Established - Level 3 Electronic Signature(s) Signed: 02/15/2023 4:26:45 PM By: [name] RN Entered By: [name] on 02/06/2023 09:00:50 -------------------------------------------------------------------------------- Encounter Discharge Information Details Patient Name: Date of Service: Karla Corwin, Karla [name] 02/06/2023 8:00 A M Medical Record Number: [id number] Patient Account Number: [id number] Date of Birth/Sex: Treating RN: 1961-02-14 (62 y.o. [name], Lauren Primary Care Cannon Quinton: Charlott Rakes Other Clinician: Referring Kaysi Ourada: Treating Alden Bensinger/Extender: Durward Fortes Weeks in Treatment: 0 Encounter Discharge Information Items Discharge Condition: Stable Ambulatory Status: Walker Discharge Destination: Home Transportation: Private Auto Accompanied By: self Schedule Follow-up Appointment: Yes Clinical Summary of Care: Patient Declined Electronic Signature(s) Signed: 02/15/2023 4:26:45 PM By: [name] RN Entered By: [name] on 02/06/2023 09:01:17 -------------------------------------------------------------------------------- Lower Extremity Assessment Details Patient Name: Date of Service: Karla Corwin, Karla [name] 02/06/2023 8:00 A M Medical Record Number: [id number] Patient Account Number: [id number] Date of Birth/Sex: Treating RN: 1961/06/26 (62 y.o. [name] Primary Care Pecola Haxton: Charlott Rakes Other Clinician: Referring Garik Diamant: Treating Lorriann Hansmann/Extender: Caroline More, [name] Weeks in Treatment: 0 Edema Assessment Assessed: [Left: Yes] [Right: Yes] Edema: [Left: Yes] [Right: Yes] Calf Left:  RightTHEA, [name] ([id number]) [phone].pdf Page 4 of 6 Point of Measurement: From Medial Instep 41 cm 41 cm Ankle Left: Right: Point of Measurement: From Medial Instep 25 cm 23 cm Vascular Assessment Pulses: Dorsalis Pedis Palpable: [Left:Yes] [Right:Yes] Posterior Tibial Palpable: [Left:Yes] [Right:Yes] Electronic Signature(s) Signed: 02/15/2023 4:26:45 PM By: [name] RN Entered By: [name] on 02/06/2023 08:19:51 -------------------------------------------------------------------------------- Multi Wound Chart Details Patient Name: Date of Service: Karla Corwin, Karla [name] 02/06/2023 8:00 A M Medical Record Number: [id number] Patient Account Number: [id number]  Date of Birth/Sex: Treating RN: [birth year] (62 y.o. F) Primary Care Wayland Baik: Charlott Rakes Other Clinician: Referring Jess Sulak: Treating Ndidi Nesby/Extender: Durward Fortes Weeks in Treatment: 0 Vital Signs Height(in): Capillary Blood Glucose(mg/dl): 190 Weight(lbs): Pulse(bpm): 74 Body Mass Index(BMI): Blood Pressure(mmHg): 117/74 Temperature(F): 98.7 Respiratory Rate(breaths/min): 17 [Treatment Notes:Wound Assessments Treatment Notes] Electronic Signature(s) Signed: 02/06/2023 12:03:15 PM By: [name] Karla Entered By: [name] on 02/06/2023 09:05:22 -------------------------------------------------------------------------------- Multi-Disciplinary Care Plan Details Patient Name: Date of Service: Karla Corwin, Karla [name] 02/06/2023 8:00 A M Medical Record Number: [id number] Patient Account Number: [id number] Date of Birth/Sex: Treating RN: 11/18/1960 (62 y.o. [name] Primary Care Eugina Row: Charlott Rakes Other Clinician: Referring Brodrick Curran: Treating Floraine Buechler/Extender: Durward Fortes Weeks in Treatment: 0 Active Inactive Electronic Signature(s) Signed: 02/15/2023 4:26:45 PM By: [name] RN Entered By:  [name] on 02/06/2023 09:00:00 -------------------------------------------------------------------------------- Pain Assessment Details Patient Name: Date of Service: Karla Corwin, Karla [name] 02/06/2023 8:00 A [name], [name] ([id number]) [id number] Page 5 of 6 Medical Record Number: [id number] Patient Account Number: [id number] Date of Birth/Sex: Treating RN: Jan 09, 1961 (62 y.o. [name], Lauren Primary Care Nakai Pollio: Charlott Rakes Other Clinician: Referring Dominico Rod: Treating Doreena Maulden/Extender: Durward Fortes Weeks in Treatment: 0 Active Problems Location of Pain Severity and Description of Pain Patient Has Paino Yes Site Locations Pain Location: Pain in Ulcers With Dressing Change: Yes Duration of the Pain. Constant / Intermittento Intermittent Rate the pain. Current Pain Level: 9 Worst Pain Level: 10 Least Pain Level: 0 Tolerable Pain Level: 9 Character of Pain Describe the Pain: Aching Pain Management and Medication Current Pain Management: Medication: No Cold Application: No Rest: No Massage: No Activity: No T.E.N.S.: No Heat Application: No Leg drop or elevation: No Is the Current Pain Management Adequate: Adequate How does your wound impact your activities of daily livingo Sleep: No Bathing: No Appetite: No Relationship With Others: No Bladder Continence: No Emotions: No Bowel Continence: No Work: No Toileting: No Drive: No Dressing: No Hobbies: No Electronic Signature(s) Signed: 02/15/2023 4:26:45 PM By: [name] RN Entered By: [name] on 02/06/2023 08:15:07 -------------------------------------------------------------------------------- Patient/Caregiver Education Details Patient Name: Date of Service: Karla LEY, Karla [name] 3/25/2024andnbsp8:00 A M Medical Record Number: [id number] Patient Account Number: [id number] Date of Birth/Gender: Treating RN: [birth year] (62 y.o. [name], Lauren Primary Care Physician: Charlott Rakes Other Clinician: Referring Physician: Treating Physician/Extender: Durward Fortes Weeks in Treatment: 0 Education Assessment Education Provided To: Patient Education Topics Provided Wound/Skin Impairment: Methods: Explain/Verbal [name], [name] ([id number]) [id number] Page 6 of 6 Responses: Reinforcements needed, State content correctly Electronic Signature(s) Signed: 02/15/2023 4:26:45 PM By: [name] RN Entered By: [name] on 02/06/2023 09:00:09 -------------------------------------------------------------------------------- Vitals Details Patient Name: Date of Service: Karla Corwin, Karla [name] 02/06/2023 8:00 A M Medical Record Number: [id number] Patient Account Number: [id number] Date of Birth/Sex: Treating RN: Mar 11, 1961 (62 y.o. [name], Lauren Primary Care Margues Filippini: Charlott Rakes Other Clinician: Referring Tamyra Fojtik: Treating Rodneisha Bonnet/Extender: Durward Fortes Weeks in Treatment: 0 Vital Signs Time Taken: 08:12 Temperature (F): 98.7 Pulse (bpm): 74 Respiratory Rate (breaths/min): 17 Blood Pressure (mmHg): 117/74 Capillary Blood Glucose (mg/dl): 190 Reference Range: 80 - 120 mg / dl Electronic Signature(s) Signed: 02/15/2023 4:26:45 PM By: [name] RN Entered By: [name] on 02/06/2023 08:12:47

## 2023-02-15 NOTE — Patient Outreach (Signed)
  Care Coordination   Follow Up/Collaboration Visit Note   02/15/2023 Name: CHARMAIN VALIS MRN: [id number] DOB: 09/14/1961  CALEEN SNOWMAN is a 62 y.o. year old female who sees Charlott Rakes, MD for primary care. I  spoke with [facility] RNCM  What matters to the patients health and wellness today?  LCSW left a message for pt requesting a return call.      SDOH assessments and interventions completed:  No     Care Coordination Interventions:  Yes, provided  Interventions Today    Flowsheet Row Most Recent Value  Chronic Disease   Chronic disease during today's visit Diabetes, Congestive Heart Failure (CHF), Chronic Obstructive Pulmonary Disease (COPD)  General Interventions   General Interventions Discussed/Reviewed Communication with  Communication with RN  L[place]better confirmed she needs permission from pt prior to submitting a [facility] referral]       Follow up plan: Follow up call scheduled for 1 week    Encounter Outcome:  Pt. Visit Completed   Christa See, MSW, [email or web address] Phone [phone] 5:02 PM

## 2023-02-15 NOTE — Progress Notes (Signed)
**Note Karla-Identified via Obfuscation** [name] ([id number]) [id number] Page 1 of 10 Visit Report for 02/06/2023 Chief Complaint Document Details Patient Name: Date of Service: [name] 02/06/2023 8:00 A M Medical Record Number: [id number] Patient Account Number: [id number] Date of Birth/Sex: Treating RN: 05-Jan-1961 (62 y.o. F) Primary Care Provider: Charlott Rakes Other Clinician: Referring Provider: Treating Provider/Extender: Durward Fortes Weeks in Treatment: 0 Information Obtained from: Patient Chief Complaint Right plantar foot wound Electronic Signature(s) Signed: 02/06/2023 12:03:15 PM By: [name] DO Entered By: [name] on 02/06/2023 09:05:29 -------------------------------------------------------------------------------- HPI Details Patient Name: Date of Service: Karla Corwin, [name] 02/06/2023 8:00 A M Medical Record Number: [id number] Patient Account Number: [id number] Date of Birth/Sex: Treating RN: 1960-11-20 (62 y.o. F) Primary Care Provider: Charlott Rakes Other Clinician: Referring Provider: Treating Provider/Extender: Durward Fortes Weeks in Treatment: 0 History of Present Illness HPI Description: This 62 year old patient who has a very long significant history of diabetes mellitus, previous alcohol and nicotine abuse, chronic data disease, COPD, diabetes mellitus, height hypertension, critical lower limb ischemia with several wounds being managed at the wound Center at [facility] for over a year. She was recently in the ER at [facility] and was referred to our center. He has had a long history of critical limb ischemia and over a period of time has had balloon angioplasties in March 2016, endarterectomy of the left carotid, lower extremity angiogram and treatment by Dr. Quay Burow, several cardiac catheterizations. Most recently she had an x-ray of her left foot while in the ER which showed no  acute abnormality. during this ER visit she was started on ciprofloxacin and asked to continue with the wound care physicians at [facility]. Her last ABI done in June 2017 showed the right side to be 0.28 in the left side to be 0.48. Her right toe brachial index was 0.17 on the right and 0.27 on the left. her last hemoglobin A1c was 11.1% She continues to smoke about a pack of cigarettes a day. 03/28/2017 -- -- right foot x-ray -- IMPRESSION:Areas of soft tissue swelling. Mild subluxation second PIP joint. No frank dislocation or fracture. No erosive change or bony destruction. No soft tissue ulceration or radiopaque foreign body evident by radiography. There is plantar fascia calcification with a nearby inferior calcaneal spur. There are foci of arterial vascular calcification. 04/04/2017 -- the patient continues to be noncompliant and continues to complain of a lot of pain and has not done anything about quitting smoking Readmission: 06/26/18 on evaluation today patient presents for reevaluation she has not been seen in our office since May 2018. Since she was last seen here in our office she has undergone testing at Dr. Elspeth Cho office where it was revealed that she had an ABI of 0.68 with her previous ABI being 0.64 and a left ABI of 0.38 with previous ABI being 0.34 this study which was performed on 04/05/18 was pretty much equivalent to the study performed on 11/22/17. The findings in the end revealed on the right would appear to be moderate right lower extremity arterial disease on the left Dr. Gwenlyn Found states moderate in the report but unfortunately this appears to be much more severe at 0.38 compared to the right. Subsequently the patient was scheduled to have angiography with Dr. Gwenlyn Found on 04/12/18. This was however canceled due to the fact that the patient was found to have chronic kidney disease stage III and it was to the point that he did  not feel that it will be safe to pursue  angiography at that point. She has not been on any antibiotics recently. At this point Dr. Gwenlyn Found has not rescheduled anything as far as the injured [place] according to the patient he is somewhat reluctant to do so. Nonetheless with her diminished blood flow this is gonna make it somewhat difficult for her to heal. 07/05/18; this is a patient I have not seen previously. She has very significant PAD as noted above. She apparently has had revascularization efforts by Dr. Gwenlyn Found on the right on 3 occasions to the patient. She was supposed to have an attempted angiography on the left however this was canceled apparently because of stage III chronic renal failure. I will need to research all of this. She complains of significant pain in the wound and has claudication enough that when she walks to the end of the driveway she has to stop. She is been using silver alginate to the wounds on her legs and Iodoflex to the area on her left second toe. 07/13/18 on evaluation today patient's wounds actually appear to be doing about the same. She has an appointment she tells me within the next month that is September 2019 with Dr. Gwenlyn Found to discuss options to see if there's anything else that you can recommend or do for her. Nonetheless obviously what we're trying to do is do what we can to save her leg and in turn prevent any additional worsening or damage. None in the meantime we been mainly trying to manage her ulcers as best we can. 07/27/18; some improvement in the multiplicity of wounds on her left lower extremity and foot. She's been using silver alginate. I was unable to determine that she actually has an appointment with Dr. Gwenlyn Found. We are checking into this The patient's wound includes [name] ([id number]) [id number] Page 2 of 10 Left lateral foot, left plantar heel, her left anterior calf wound looks close to me, left fourth toe is very close to closed and the left medial calf  is perhaps the largest open area READMISSION 02/19/2021 This is a now 62 year old woman with type 2 diabetes and continued cigarette smoking. She has known PAD. She was last in this clinic in 2019 at that point with wounds on her left foot. She left in a nonhealed state. On 06/28/2019 I see she had a left femoropopliteal by vein and vascular with an amputation of the fifth ray. These managed to heal over. Her last arterial studies were in February 2021. This showed an absent waveform at the posterior tibial artery on the right a dampened monophasic dorsalis pedis on the right of 0.44. On the left again the PTA TA was absent her dorsalis pedis was 0.99 triphasic and her great toe pressure was 0.65. This would have been after her revascularization. Once again she tells me that Dr. Gwenlyn Found has done revascularization on the right leg on 3 different occasions. She has a right transmetatarsal amputation apparently done by Dr. Sharol Given remotely but I do not see a note for these right lower extremity revascularization but I may not of looked back far enough. In any case she says that the she has a wound on the plantar aspect of the right transmetatarsal amputation site there is been there for several months. More recently she fell and had a wound on her right medial lower leg she had 5 sutures placed in the ER and then subsequently she has developed an area on the medial lower  leg which was a blister that opened up. Past medical history includes type 2 diabetes, PAD, chronic renal failure, congestive heart failure, right transmetatarsal amputation, left fifth ray amputation, continued tobacco abuse, atrial fibrillation and cirrhosis. We did not attempt an ABI on the right leg today because of pain 02/26/2021; patient we admitted to the clinic last week. She has what looks to be 2 areas on her right medial and right anterior lower leg that look more like venous wounds but she has 1 on the first met head at the base  of her right transmet. She has known severe PAD. She complains of a lot of pain although some of this may be neuropathic. Is difficult to exclude a component of claudication. Use silver alginate on the wounds on her legs and Iodoflex on the area on the first met head. She has an appointment with Dr. Gwenlyn Found on 5/25 although I will text him and discussed the situation. She is have poorly controlled diabetic. She has has stage IIIb chronic renal failure 4/22; patient presents for 1 week follow-up. The 2 areas on her right medial and right anterior lower leg appear well-healing. She has been using silver alginate to this area without issues. She has a first met head ulcer and it is unclear how long this has been there as it was discovered in the ED earlier this month when she was being evaluated for another issue. Iodoflex has been used at this area. 4/29; patient presents for 1 week follow-up. She has been using silver alginate to the leg wounds and Iodoflex to the plantar foot wound. She has had this wrapped with Coban and Kerlix. She has no concerns or complaints today. 5/6; this is a difficult wound on the right plantar foot transmit site. We are not making a lot of progress. She had 2 more venous looking wounds on the right medial and right anterior lower leg 1 of which is healed. Her appointment with Dr. Gwenlyn Found is on 5/25 5/16; she did not tolerate the offloading shoe we gave her in fact she had a fall with an abrasion on her right forearm she is now back in the modified small shoe which does not offload her foot properly. Her appoint with Dr. Gwenlyn Found is still on 5/25 we have been using Iodoflex. The area on the right anterior lower leg is healed 7/18; patient presents for follow-up however has not been seen in 2 months. She was last seen at the end of May. She had a fall at the end of June and was hospitalized. She has been unable to follow-up since then. For the wound she has only been keeping it  covered with gauze. She is scheduled for an aortogram with lower extremity runoff at the end of July. 7/29; patient presents for 2-week follow-up. She has home health and they have been changing her dressings. She denies any issues and has no complaints today. She states she had a procedure where they opened up one of her vessels in her legs. She denies signs of infection. 8/5; patient presents for follow-up. She was recently in the hospital for bradycardia. She was noted to have cellulitis to the left lower extremity and Unna boot was placed due to blisters and increased swelling. She reports improvement to her left leg since being in the hospital and stability check her right plantar foot wound. She denies signs of infection. 8/23; since I last saw this patient a lot has happened. She still has the area over  the right first met head in the setting of previous transmetatarsal amputation. Firstly most importantly she underwent revascularization of her occluded right SFA. She underwent directional atherectomy followed by drug-coated balloon angioplasty. She has no named vessel below the knee hopefully the revascularization of the right SFA will improve collateral flow. It is not felt however that she has any endovascular options. She had follow-up arterial studies noninvasive on 8/9. These showed an ABI of 0.44 at the right PTA. Monophasic waveforms. On the left her great toe ABI was 0.68. Her follow-up arterial Doppler showed a 50 to 74% stenosis in the proximal SFA and a 50 to 74% stenosis in the distal SFA mild to severe atherosclerosis noted throughout the extremity. Areas of shadowing plaque seen, unable to rule out higher grade stenosis she also had a fall apparently wearing a right foot forefoot off loader that we gave her. She therefore comes in in an ordinary running shoe today. Not certain if this is the fall that ended up in the hospital with bradycardia 9/6 area over the right first  metatarsal head in the setting of her previous amputation and severe PAD. She is also a diabetic with known PAD status post attempt at revascularization. She is continued smoker Again the separation of visits in the clinic is somewhat disturbing if we are going to consider her for a total contact cast. She is not wearing anything to offload this stating it causes imbalance especially the forefoot off loader. She basically comes in in bedroom slippers She also had a fall this morning about an hour ago. She has a skin tear on the left dorsal forearm 9/13; areas over the right first metatarsal head in the setting of previous TMA and severe PAD. Again she comes in here in slippers. We have been using Hydrofera Blue. We use MolecuLight on this which was essentially negative study 9/20; right first metatarsal head in the setting of her previous TMA and severe PAD. Same slipper type shoes. She says she cannot wear a forefoot off loader and for some reason she will not wear surgical shoes. She says she is smoking half a pack per day. 9/30; right first metatarsal head. Absolutely no improvement in fact there is tunneling superiorly very close to bone. She does not offload this properly at all. We use MolecuLight on this 2 weeks ago that did not show any surface bacteria we have been using silver collagen is a dressing She tells me she is down to 3 cigarettes a day I have offered encouragement and a treatment plan 10/6; right first metatarsal head. Exposed bone this week which is not surprising. We have been using silver collagen. She is smoking 5 to 10 cigarettes a day 10/17; she has not been here in 10 days. The bone scraping that I did on 10/6 showed staph lugdunensis, staph epidermidis and Pseudomonas Alcalifenes. I put in for Septra DS 1 p.o. twice daily for 14 days last week would be but we could not reach her to start the antibiotic. Quinolones would have been a good alternative except she has multiple  drug interactions. Septra would not cover what ever the Pseudomonas is but I am not really sure of the relevance here. I am going to try her on the Septra for 2 weeks. She has a probing wound on the right TMA that probes to bone. She claims to have just about stop smoking I reviewed her arterial status. She underwent a left fem-tib bypass by Dr. Donnetta Hutching in 06/28/2019.  Her last angiogram was in July of this year by Dr. Gwenlyn Found. This showed an occluded right SFA the popliteal and tibial vessels were also occluded the peroneal vessel filled by collaterals and filled the PT and DP by communicating collaterals. She was not felt to have any additional and vascular options. Dr. Gwenlyn Found noted that she he had previously revascularized her right SFA 3 times. 10/25; she is tolerating the Septra but says it makes her nauseated. I am going to continue this for an additional 2 weeks perhaps for a total of 6 weeks. Her wound does not look too much different. There is on the right first met metatarsal head. There is probing areas around the tissue that is in the middle of the wound. There is no purulence I cannot feel bone. The original source was for a bone scraping. 11/8; right first metatarsal head. This does not probe to bone and there is no purulence. As far as I can tell she is completed the Septra although there were SAMAURIA, GLAAB ([id number]) [id number] Page 3 of 10 problems with exact length of time and I think compliance. In any case I gave her 6 weeks. I have reviewed her PAD above. 11/15; right first metatarsal head. Again protruding subcutaneous tissue but this does not have any adherence to surrounding skin. Undermining circumferentially. There is no palpable bone. Not grossly purulent. We have been using Iodoflex to try to get some adherence to clean off the surface but no real improvement. I do not think they actually had enough supplies listening to the patient. She completed  the 6 weeks of Septra I gave her, but I am not sure about the compliance with the dates i.e. may have missed days taken 1 a day etc. The patient has a vascular follow-up with Dr. Gwenlyn Found this coming Friday i.e. in 3 days. By my read of arterial evaluations I do not think she is felt to be a candidate for any further revascularization. The last angiogram was in July. 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 She continues to smoke now up to 10 cigarettes a day. She is not in any pain. Wound has not improved but is certainly not worsened. I gave her 6 weeks of trimethoprim/sulfamethoxazole which she is completed in some fashion. 11/29 right first metatarsal head previous TMA. Wound actually looks somewhat better today still a lot of callus around the wound on debris on the wound surface. UNFORTUNATELY the patient missed her appointment with Dr. Gwenlyn Found and is not rebooked apparently until sometime in February 12/13; right first metatarsal wound actually looks some smaller today. I did not debride this today. She is using Iodoflex. When she came in last week she had a large abscess on her left anterior lower leg apparently after falling we send her to the ER to have this drained which was done. I cannot see any cultures x-ray was negative. She was apparently given 2 doses of IV antibiotics and discharged on Bactrim DS twice daily which she is still taking. As mentioned I cannot see any cultures. 12/29; culture of the abscess on the left leg I did last time showed Serratia. We gave her antibiotics. I note she was in the ER next day with bleeding which they controlled she is on anticoagulants.She comes in today to clinic and the area on her right plantar foot is miraculously very hyperkeratotic but healed 02/06/2023 Ms. 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 She reports  a callus to the right first met head and on the lateral aspect of the left foot. She saw podiatry 3 days ago and had the callus debrided. There are no open wounds at this time. She follows up with podiatry in 2 weeks. Electronic Signature(s) Signed: 02/06/2023 12:03:15 PM By: [name] DO Entered By: [name] on 02/06/2023 09:07:25 -------------------------------------------------------------------------------- Physical Exam Details Patient Name: Date of Service: Karla Corwin, [name] 02/06/2023 8:00 A M Medical Record Number: [id number] Patient Account Number: [id number] Date of Birth/Sex: Treating RN: Jun 08, 1961 (61 y.o. F) Primary Care Provider: Charlott Rakes Other Clinician: Referring Provider: Treating Provider/Extender: Durward Fortes Weeks in Treatment: 0 Constitutional respirations regular, non-labored and within target range for patient.. Cardiovascular 2+ dorsalis pedis/posterior tibialis pulses. Psychiatric pleasant and cooperative. Notes Callus to the right first met head and lateral left foot. No open wounds. Electronic Signature(s) Signed: 02/06/2023 12:03:15 PM By: [name] DO Entered By: [name] on 02/06/2023 09:07:49 -------------------------------------------------------------------------------- Physician Orders Details Patient Name: Date of Service: Karla Corwin, [name] 02/06/2023 8:00 A M Medical Record Number: [id number] Patient Account Number: [id number] Date of Birth/Sex: Treating RN: 1961/08/29 (62 y.o. Tonita Phoenix, Lauren Primary Care Provider: Charlott Rakes Other Clinician: Referring Provider: Treating Provider/Extender: Durward Fortes Weeks in Treatment: 0 Verbal / Phone Orders: No Diagnosis Coding ICD-10 Coding Code Description JINI, KASTENS ([id number]) [id number] Page 4 of 10 E11.628 Type 2 diabetes mellitus with other skin complications [id number] Venous  insufficiency (chronic) (peripheral) Discharge From [facility] Discharge from [place] Signature(s) Signed: 02/06/2023 12:03:15 PM By: [name] DO Entered By: [name] on 02/06/2023 09:07:54 -------------------------------------------------------------------------------- Problem List Details Patient Name: Date of Service: Karla Corwin, [name] 02/06/2023 8:00 A M Medical Record Number: [id number] Patient Account Number: [id number] Date of Birth/Sex: Treating RN: 30-Nov-1960 (62 y.o. F) Primary Care Provider: Charlott Rakes Other Clinician: Referring Provider: Treating Provider/Extender: Caroline More, Charlane Ferretti Weeks in Treatment: 0 Active Problems ICD-10 Encounter Code Description Active Date MDM Diagnosis E11.65 Type 2 diabetes mellitus with hyperglycemia 02/06/2023 No Yes I87.2 Venous insufficiency (chronic) (peripheral) 02/06/2023 No Yes Inactive Problems Resolved Problems Electronic Signature(s) Signed: 02/06/2023 12:03:15 PM By: [name] DO Entered By: [name] on 02/06/2023 09:05:16 -------------------------------------------------------------------------------- Progress Note Details Patient Name: Date of Service: Karla Corwin, [name] 02/06/2023 8:00 A M Medical Record Number: [id number] Patient Account Number: [id number] Date of Birth/Sex: Treating RN: Apr 29, 1961 (62 y.o. F) Primary Care Provider: Charlott Rakes Other Clinician: Referring Provider: Treating Provider/Extender: Durward Fortes Weeks in Treatment: 0 Subjective Chief Complaint Information obtained from Patient Right plantar foot wound History of Present Illness (HPI) This 62 year old patient who has a very long significant history of diabetes mellitus, previous alcohol and nicotine abuse, chronic data disease, COPD, diabetes mellitus, height hypertension, critical lower limb ischemia with several wounds being managed at the wound  Center at [facility] for over a year. She was recently in the ER at [facility] and was referred to our center. He has had a long history of critical limb ischemia and over a period of time has had balloon angioplasties in March 2016, endarterectomy of the left carotid, lower extremity angiogram and treatment by Dr. Quay Burow, several cardiac catheterizations. Most recently she had an x-ray of her left foot while in the ER which showed no acute abnormality. during this ER  visit she was started on ciprofloxacin and asked to continue with the wound care physicians at [facility]. Her last ABI done in June 2017 showed the right side to be 0.28 in the left side to be 0.48. Her right toe brachial index was 0.17 on the right and 0.27 on the left. her last hemoglobin A1c was 11.1% She continues to smoke about a pack of cigarettes a day. MARABELLA, FOO ([id number]) [id number] Page 5 of 10 03/28/2017 -- -- right foot x-ray -- IMPRESSION:Areas of soft tissue swelling. Mild subluxation second PIP joint. No frank dislocation or fracture. No erosive change or bony destruction. No soft tissue ulceration or radiopaque foreign body evident by radiography. There is plantar fascia calcification with a nearby inferior calcaneal spur. There are foci of arterial vascular calcification. 04/04/2017 -- the patient continues to be noncompliant and continues to complain of a lot of pain and has not done anything about quitting smoking Readmission: 06/26/18 on evaluation today patient presents for reevaluation she has not been seen in our office since May 2018. Since she was last seen here in our office she has undergone testing at Dr. Fransico Michael office where it was revealed that she had an ABI of 0.68 with her previous ABI being 0.64 and a left ABI of 0.38 with previous ABI being 0.34 this study which was performed on 04/05/18 was pretty much equivalent to the study performed on  11/22/17. The findings in the end revealed on the right would appear to be moderate right lower extremity arterial disease on the left Dr. Gwenlyn Found states moderate in the report but unfortunately this appears to be much more severe at 0.38 compared to the right. Subsequently the patient was scheduled to have angiography with Dr. Gwenlyn Found on 04/12/18. This was however canceled due to the fact that the patient was found to have chronic kidney disease stage III and it was to the point that he did not feel that it will be safe to pursue angiography at that point. She has not been on any antibiotics recently. At this point Dr. Gwenlyn Found has not rescheduled anything as far as the injured [place] according to the patient he is somewhat reluctant to do so. Nonetheless with her diminished blood flow this is gonna make it somewhat difficult for her to heal. 07/05/18; this is a patient I have not seen previously. She has very significant PAD as noted above. She apparently has had revascularization efforts by Dr. Gwenlyn Found on the right on 3 occasions to the patient. She was supposed to have an attempted angiography on the left however this was canceled apparently because of stage III chronic renal failure. I will need to research all of this. She complains of significant pain in the wound and has claudication enough that when she walks to the end of the driveway she has to stop. She is been using silver alginate to the wounds on her legs and Iodoflex to the area on her left second toe. 07/13/18 on evaluation today patient's wounds actually appear to be doing about the same. She has an appointment she tells me within the next month that is September 2019 with Dr. Gwenlyn Found to discuss options to see if there's anything else that you can recommend or do for her. Nonetheless obviously what we're trying to do is do what we can to save her leg and in turn prevent any additional worsening or damage. None in the meantime we been mainly trying to  manage her ulcers as best  we can. 07/27/18; some improvement in the multiplicity of wounds on her left lower extremity and foot. She's been using silver alginate. I was unable to determine that she actually has an appointment with Dr. Gwenlyn Found. We are checking into this The patient's wound includes Left lateral foot, left plantar heel, her left anterior calf wound looks close to me, left fourth toe is very close to closed and the left medial calf is perhaps the largest open area READMISSION 02/19/2021 This is a now 62 year old woman with type 2 diabetes and continued cigarette smoking. She has known PAD. She was last in this clinic in 2019 at that point with wounds on her left foot. She left in a nonhealed state. On 06/28/2019 I see she had a left femoropopliteal by vein and vascular with an amputation of the fifth ray. These managed to heal over. Her last arterial studies were in February 2021. This showed an absent waveform at the posterior tibial artery on the right a dampened monophasic dorsalis pedis on the right of 0.44. On the left again the PTA TA was absent her dorsalis pedis was 0.99 triphasic and her great toe pressure was 0.65. This would have been after her revascularization. Once again she tells me that Dr. Gwenlyn Found has done revascularization on the right leg on 3 different occasions. She has a right transmetatarsal amputation apparently done by Dr. Sharol Given remotely but I do not see a note for these right lower extremity revascularization but I may not of looked back far enough. In any case she says that the she has a wound on the plantar aspect of the right transmetatarsal amputation site there is been there for several months. More recently she fell and had a wound on her right medial lower leg she had 5 sutures placed in the ER and then subsequently she has developed an area on the medial lower leg which was a blister that opened up. Past medical history includes type 2 diabetes, PAD, chronic  renal failure, congestive heart failure, right transmetatarsal amputation, left fifth ray amputation, continued tobacco abuse, atrial fibrillation and cirrhosis. We did not attempt an ABI on the right leg today because of pain 02/26/2021; patient we admitted to the clinic last week. She has what looks to be 2 areas on her right medial and right anterior lower leg that look more like venous wounds but she has 1 on the first met head at the base of her right transmet. She has known severe PAD. She complains of a lot of pain although some of this may be neuropathic. Is difficult to exclude a component of claudication. Use silver alginate on the wounds on her legs and Iodoflex on the area on the first met head. She has an appointment with Dr. Gwenlyn Found on 5/25 although I will text him and discussed the situation. She is have poorly controlled diabetic. She has has stage IIIb chronic renal failure 4/22; patient presents for 1 week follow-up. The 2 areas on her right medial and right anterior lower leg appear well-healing. She has been using silver alginate to this area without issues. She has a first met head ulcer and it is unclear how long this has been there as it was discovered in the ED earlier this month when she was being evaluated for another issue. Iodoflex has been used at this area. 4/29; patient presents for 1 week follow-up. She has been using silver alginate to the leg wounds and Iodoflex to the plantar foot wound. She has had  this wrapped with Coban and Kerlix. She has no concerns or complaints today. 5/6; this is a difficult wound on the right plantar foot transmit site. We are not making a lot of progress. She had 2 more venous looking wounds on the right medial and right anterior lower leg 1 of which is healed. Her appointment with Dr. Gwenlyn Found is on 5/25 5/16; she did not tolerate the offloading shoe we gave her in fact she had a fall with an abrasion on her right forearm she is now back in the  modified small shoe which does not offload her foot properly. Her appoint with Dr. Gwenlyn Found is still on 5/25 we have been using Iodoflex. The area on the right anterior lower leg is healed 7/18; patient presents for follow-up however has not been seen in 2 months. She was last seen at the end of May. She had a fall at the end of June and was hospitalized. She has been unable to follow-up since then. For the wound she has only been keeping it covered with gauze. She is scheduled for an aortogram with lower extremity runoff at the end of July. 7/29; patient presents for 2-week follow-up. She has home health and they have been changing her dressings. She denies any issues and has no complaints today. She states she had a procedure where they opened up one of her vessels in her legs. She denies signs of infection. 8/5; patient presents for follow-up. She was recently in the hospital for bradycardia. She was noted to have cellulitis to the left lower extremity and Unna boot was placed due to blisters and increased swelling. She reports improvement to her left leg since being in the hospital and stability check her right plantar foot wound. She denies signs of infection. 8/23; since I last saw this patient a lot has happened. She still has the area over the right first met head in the setting of previous transmetatarsal amputation. Firstly most importantly she underwent revascularization of her occluded right SFA. She underwent directional atherectomy followed by drug-coated balloon angioplasty. She has no named vessel below the knee hopefully the revascularization of the right SFA will improve collateral flow. It is not felt however that she has any endovascular options. She had follow-up arterial studies noninvasive on 8/9. These showed an ABI of 0.44 at the right PTA. Monophasic waveforms. On the left her great toe ABI was 0.68. Her follow-up arterial Doppler showed a 50 to 74% stenosis in the proximal SFA  and a 50 to 74% stenosis in the distal SFA mild to severe atherosclerosis noted throughout the extremity. Areas of shadowing plaque seen, unable to rule out higher grade stenosis she also had a fall apparently wearing a right foot forefoot off loader that we gave her. She therefore comes in in an ordinary running shoe today. Not certain if this is the fall that ended up in the hospital with bradycardia 9/6 area over the right first metatarsal head in the setting of her previous amputation and severe PAD. She is also a diabetic with known PAD status post DEAVION, OLIVEROS ([id number]) [id number] Page 6 of 10 attempt at revascularization. She is continued smoker Again the separation of visits in the clinic is somewhat disturbing if we are going to consider her for a total contact cast. She is not wearing anything to offload this stating it causes imbalance especially the forefoot off loader. She basically comes in in bedroom slippers She also had a fall this morning about an  hour ago. She has a skin tear on the left dorsal forearm 9/13; areas over the right first metatarsal head in the setting of previous TMA and severe PAD. Again she comes in here in slippers. We have been using Hydrofera Blue. We use MolecuLight on this which was essentially negative study 9/20; right first metatarsal head in the setting of her previous TMA and severe PAD. Same slipper type shoes. She says she cannot wear a forefoot off loader and for some reason she will not wear surgical shoes. She says she is smoking half a pack per day. 9/30; right first metatarsal head. Absolutely no improvement in fact there is tunneling superiorly very close to bone. She does not offload this properly at all. We use MolecuLight on this 2 weeks ago that did not show any surface bacteria we have been using silver collagen is a dressing She tells me she is down to 3 cigarettes a day I have offered encouragement and a  treatment plan 10/6; right first metatarsal head. Exposed bone this week which is not surprising. We have been using silver collagen. She is smoking 5 to 10 cigarettes a day 10/17; she has not been here in 10 days. The bone scraping that I did on 10/6 showed staph lugdunensis, staph epidermidis and Pseudomonas Alcalifenes. I put in for Septra DS 1 p.o. twice daily for 14 days last week would be but we could not reach her to start the antibiotic. Quinolones would have been a good alternative except she has multiple drug interactions. Septra would not cover what ever the Pseudomonas is but I am not really sure of the relevance here. I am going to try her on the Septra for 2 weeks. She has a probing wound on the right TMA that probes to bone. She claims to have just about stop smoking I reviewed her arterial status. She underwent a left fem-tib bypass by Dr. Donnetta Hutching in 06/28/2019. Her last angiogram was in July of this year by Dr. Gwenlyn Found. This showed an occluded right SFA the popliteal and tibial vessels were also occluded the peroneal vessel filled by collaterals and filled the PT and DP by communicating collaterals. She was not felt to have any additional and vascular options. Dr. Gwenlyn Found noted that she he had previously revascularized her right SFA 3 times. 10/25; she is tolerating the Septra but says it makes her nauseated. I am going to continue this for an additional 2 weeks perhaps for a total of 6 weeks. Her wound does not look too much different. There is on the right first met metatarsal head. There is probing areas around the tissue that is in the middle of the wound. There is no purulence I cannot feel bone. The original source was for a bone scraping. 11/8; right first metatarsal head. This does not probe to bone and there is no purulence. As far as I can tell she is completed the Septra although there were problems with exact length of time and I think compliance. In any case I gave her 6 weeks.  I have reviewed her PAD above. 11/15; right first metatarsal head. Again protruding subcutaneous tissue but this does not have any adherence to surrounding skin. Undermining circumferentially. There is no palpable bone. Not grossly purulent. We have been using Iodoflex to try to get some adherence to clean off the surface but no real improvement. I do not think they actually had enough supplies listening to the patient. She completed the 6 weeks of Septra I  gave her, but I am not sure about the compliance with the dates i.e. may have missed days taken 1 a day etc. The patient has a vascular follow-up with Dr. Gwenlyn Found this coming Friday i.e. in 3 days. By my read of arterial evaluations I do not think she is felt to be a candidate for any further revascularization. The last angiogram was in July. 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 She continues to smoke now up to 10 cigarettes a day. She is not in any pain. Wound has not improved but is certainly not worsened. I gave her 6 weeks of trimethoprim/sulfamethoxazole which she is completed in some fashion. 11/29 right first metatarsal head previous TMA. Wound actually looks somewhat better today still a lot of callus around the wound on debris on the wound surface. UNFORTUNATELY the patient missed her appointment with Dr. Gwenlyn Found and is not rebooked apparently until sometime in February 12/13; right first metatarsal wound actually looks some smaller today. I did not debride this today. She is using Iodoflex. oo When she came in last week she had a large abscess on her left anterior lower leg apparently after falling we send her to the ER to have this drained which was done. I cannot see any cultures x-ray was negative. She was apparently given 2 doses of IV antibiotics and discharged on Bactrim DS twice daily which she is still taking. As mentioned I cannot see any cultures. 12/29; culture of the abscess on the left leg I did last time  showed Serratia. We gave her antibiotics. 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 She reports a callus to the right first met head and on the lateral aspect of the left foot. She saw podiatry 3 days ago and had the callus debrided. There are no open wounds at this time. She follows up with podiatry in 2 weeks. Patient History Information obtained from Patient. Allergies gabapentin (Severity: Moderate, Reaction: Shakes), Lyrica Family History Cancer - Mother, Diabetes - Mother, Heart Disease - Mother,Father, Hypertension - Mother,Father, Lung Disease - Siblings, Stroke - Mother, Thyroid Problems - Mother, No family history of Hereditary Spherocytosis, Kidney Disease, Seizures, Tuberculosis. Social History Former smoker, Marital Status - Divorced, Alcohol Use - Never, Drug Use - Current History - marijuana, Caffeine Use - Daily - Coffee. Medical History Eyes Denies history of Cataracts, Glaucoma, Optic Neuritis Ear/Nose/Mouth/Throat Denies history of Chronic sinus problems/congestion, Middle ear problems Hematologic/Lymphatic Denies history of Anemia, Hemophilia, Human Immunodeficiency Virus, Lymphedema, Sickle Cell Disease Respiratory Patient has history of Chronic Obstructive Pulmonary Disease (COPD), Sleep Apnea Denies history of Aspiration, Asthma, Pneumothorax, Tuberculosis Cardiovascular Patient has history of Arrhythmia - NSVT , PAF , PAT Congestive Heart Failure - chronic combined, Coronary Artery Disease, Hypertension, Peripheral , Arterial Disease Denies history of Angina, Deep Vein Thrombosis, Hypotension, Myocardial Infarction,  Peripheral Venous Disease, Phlebitis, Vasculitis Gastrointestinal Patient has history of Cirrhosis - alcoholic DRISANA, HUI ([id number]) [id number] Page 7 of 10 Denies history of Colitis, Crohnoos, Hepatitis A, Hepatitis B, Hepatitis C Endocrine Patient has history of Type II Diabetes Denies history of Type I Diabetes Genitourinary Denies history of End  Stage Renal Disease Immunological Denies history of Lupus Erythematosus, Raynaudoos, Scleroderma Integumentary (Skin) Denies history of History of Burn Musculoskeletal Patient has history of Osteoarthritis Denies history of Gout, Rheumatoid Arthritis, Osteomyelitis Neurologic Patient has history of Neuropathy - dm2 peripheral Denies history of Dementia, Quadriplegia, Paraplegia, Seizure Disorder Oncologic Denies history of Received Chemotherapy, Received Radiation Psychiatric Denies history of Anorexia/bulimia, Confinement Anxiety Hospitalization/Surgery History - balloon angioplasty. - cardiac cath X2. - cardioversionX2. - carotid angiogram. - DandC uterus. - endarcdectomy (L). - (L) heart cath. - lower ext angiogram X2. - peripheral athrectomy. - 05/03/2021-05/05/2021 inpatient cone-fall CKD. Medical A Surgical History Notes nd Constitutional Symptoms (General Health) alcohol abuse , cocaine abuse , marijuana abuse , narcotic abuse , noncompliance , obesity , tobacco abuse , insomnia , Hematologic/Lymphatic B12 deficiency Respiratory wheezing , Cardiovascular cardiomyopathy , critical lower limb ischemia , elevated troponin , hyperlipidemia , hypokalemia , hypomagnesiumia , symptomatic bradycardia , carotid arterial disease ,demand ischemia , Gastrointestinal GERD Endocrine hypothyroidism Genitourinary renal insufficiency , CKD Stage 3 , Integumentary (Skin) cellulitis Psychiatric anxiety Objective Constitutional respirations regular, non-labored and within target range for  patient.. Vitals Time Taken: 8:12 AM, Temperature: 98.7 F, Pulse: 74 bpm, Respiratory Rate: 17 breaths/min, Blood Pressure: 117/74 mmHg, Capillary Blood Glucose: 190 mg/dl. Cardiovascular 2+ dorsalis pedis/posterior tibialis pulses. Psychiatric pleasant and cooperative. General Notes: Callus to the right first met head and lateral left foot. No open wounds. Assessment Active Problems ICD-10 Type 2 diabetes mellitus with hyperglycemia Venous insufficiency (chronic) (peripheral) Patient is a self-referral. She has calluses to her feet bilaterally that are being managed by podiatry. In fact she had these debrided 3 days ago. She has no KERILEE, RZEPECKI ([id number]) [id number] Page 8 of 10 open wounds. I recommended she continue to follow with podiatry for this issue. She may follow-up with [place] as needed. Plan Discharge From [facility]: Discharge from [place] 1. Follow-up with podiatry 2. Follow-up with the wound care center as needed Electronic Signature(s) Signed: 02/06/2023 12:03:15 PM By: [name] DO Entered By: [name] on 02/06/2023 09:08:34 -------------------------------------------------------------------------------- HxROS Details Patient Name: Date of Service: Karla Corwin, [name] 02/06/2023 8:00 A M Medical Record Number: [id number] Patient Account Number: [id number] Date of Birth/Sex: Treating RN: 1961-03-13 (62 y.o. Tonita Phoenix, Lauren Primary Care Provider: Charlott Rakes Other Clinician: Referring Provider: Treating Provider/Extender: Durward Fortes Weeks in Treatment: 0 Information Obtained From Patient Constitutional Symptoms (General Health) Medical History: Past Medical History Notes: alcohol abuse , cocaine abuse , marijuana abuse , narcotic abuse , noncompliance , obesity , tobacco abuse , insomnia , Eyes Medical History: Negative for: Cataracts; Glaucoma; Optic  Neuritis Ear/Nose/Mouth/Throat Medical History: Negative for: Chronic sinus problems/congestion; Middle ear problems Hematologic/Lymphatic Medical History: Negative for: Anemia; Hemophilia; Human Immunodeficiency Virus; Lymphedema; Sickle Cell Disease Past Medical History Notes: B12 deficiency Respiratory Medical History: Positive for: Chronic Obstructive Pulmonary Disease (COPD); Sleep Apnea Negative for: Aspiration; Asthma; Pneumothorax; Tuberculosis Past Medical History Notes: wheezing , Cardiovascular Medical History: Positive for: Arrhythmia - NSVT , PAF , PAT Congestive Heart Failure - chronic combined; Coronary Artery Disease; Hypertension; Peripheral Arterial ; Disease Negative for: Angina; Deep Vein Thrombosis; Hypotension; Myocardial Infarction; Peripheral Venous Disease; Phlebitis; Vasculitis Past Medical History Notes: cardiomyopathy , critical lower limb ischemia , elevated troponin , hyperlipidemia , hypokalemia , hypomagnesiumia , symptomatic bradycardia , carotid arterial disease ,demand ischemia , Gastrointestinal Medical History: Positive for: Cirrhosis - alcoholic FINLEY, VINCENZO ([id number]) [id number] Page 9 of 10 Negative for: Colitis; Crohns;  Hepatitis A; Hepatitis B; Hepatitis C Past Medical History Notes: GERD Endocrine Medical History: Positive for: Type II Diabetes Negative for: Type I Diabetes Past Medical History Notes: hypothyroidism Time with diabetes: 5 years Treated with: Insulin Blood sugar tested every day: Yes Tested : 3x a day Genitourinary Medical History: Negative for: End Stage Renal Disease Past Medical History Notes: renal insufficiency , CKD Stage 3 , Immunological Medical History: Negative for: Lupus Erythematosus; Raynauds; Scleroderma Integumentary (Skin) Medical History: Negative for: History of Burn Past Medical History Notes: cellulitis Musculoskeletal Medical History: Positive for:  Osteoarthritis Negative for: Gout; Rheumatoid Arthritis; Osteomyelitis Neurologic Medical History: Positive for: Neuropathy - dm2 peripheral Negative for: Dementia; Quadriplegia; Paraplegia; Seizure Disorder Oncologic Medical History: Negative for: Received Chemotherapy; Received Radiation Psychiatric Medical History: Negative for: Anorexia/bulimia; Confinement Anxiety Past Medical History Notes: anxiety Immunizations Pneumococcal Vaccine: Received Pneumococcal Vaccination: Yes Received Pneumococcal Vaccination On or After 60th Birthday: No Tetanus Vaccine: Last tetanus shot: 10/14/1981 Implantable Devices None Hospitalization / Surgery History Type of Hospitalization/Surgery balloon angioplasty cardiac cath X2 cardioversionX2 carotid angiogram DandC uterus endarcdectomy (L) (L) heart cath lower ext angiogram DUNG, DEWHIRST ([id number]) [id number] Page 10 of 10 peripheral athrectomy 05/03/2021-05/05/2021 inpatient cone-fall CKD Family and Social History Cancer: Yes - Mother; Diabetes: Yes - Mother; Heart Disease: Yes - Mother,Father; Hereditary Spherocytosis: No; Hypertension: Yes - Mother,Father; Kidney Disease: No; Lung Disease: Yes - Siblings; Seizures: No; Stroke: Yes - Mother; Thyroid Problems: Yes - Mother; Tuberculosis: No; Former smoker; Marital Status - Divorced; Alcohol Use: Never; Drug Use: Current History - marijuana; Caffeine Use: Daily - Coffee; Financial Concerns: No; Food, Clothing or Shelter Needs: No; Support System Lacking: No; Transportation Concerns: No Electronic Signature(s) Signed: 02/06/2023 12:03:15 PM By: [name] DO Signed: 02/15/2023 4:26:45 PM By: [name] RN Entered By: [name] on 02/06/2023 08:13:06 -------------------------------------------------------------------------------- SuperBill Details Patient Name: Date of Service: Karla Corwin, [name] 02/06/2023 Medical Record Number:  [id number] Patient Account Number: [id number] Date of Birth/Sex: Treating RN: May 05, 1961 (62 y.o. Tonita Phoenix, Lauren Primary Care Provider: Charlott Rakes Other Clinician: Referring Provider: Treating Provider/Extender: Durward Fortes Weeks in Treatment: 0 Diagnosis Coding ICD-10 Codes Code Description E11.628 Type 2 diabetes mellitus with other skin complications [id number] Venous insufficiency (chronic) (peripheral) Facility Procedures : CPT4 Code: [id number] Description: 99213 - WOUND CARE VISIT-LEV 3 EST PT Modifier: Quantity: 1 Physician Procedures : CPT4 Code Description Modifier [id number] - WC PHYS LEVEL 3 - EST PT ICD-10 Diagnosis Description E11.628 Type 2 diabetes mellitus with other skin complications [id number] Venous insufficiency (chronic) (peripheral) Quantity: 1 Electronic Signature(s) Signed: 02/06/2023 12:03:15 PM By: [name] DO Entered By: [name] on 02/06/2023 [id number]

## 2023-02-15 NOTE — Progress Notes (Signed)
**Note Karla-Identified via Obfuscation** KISTA, HUSCHKA ([id number]) [id number] Page 1 of 4 Visit Report for 02/06/2023 Abuse Risk Screen Details Patient Name: Date of Service: [name] 02/06/2023 8:00 A M Medical Price Number: [id number] Patient Account Number: [id number] Date of Birth/Sex: Treating RN: 1961/10/20 (62 y.o. [name], [name] Primary Care [name]: [name] Other Clinician: Referring [name]: Treating [name]/Extender: [name] in Treatment: 0 Abuse Risk Screen Items Answer ABUSE RISK SCREEN: Has anyone close to you tried to hurt or harm you recentlyo No Karla you feel uncomfortable with anyone in your familyo No Has anyone forced you Karla things that you didnt want to doo No Electronic Signature(Price) Signed: 02/15/2023 4:26:45 PM By: [name] RN Entered By: [name] on 02/06/2023 08:13:12 -------------------------------------------------------------------------------- Activities of Daily Living Details Patient Name: Date of Service: Karla [name]. 02/06/2023 8:00 A M Medical Price Number: [id number] Patient Account Number: [id number] Date of Birth/Sex: Treating RN: [birth year] (62 y.o. [name], [name] Primary Care [name]: [name] Other Clinician: Referring [name]: Treating [name]/Extender: [name] in Treatment: 0 Activities of Daily Living Items Answer Activities of Daily Living (Please select one for each item) Drive Automobile Need Assistance T Medications ake Completely Able Use T elephone Completely Able Care for Appearance Completely Able Use T oilet Completely Able Bath / Shower Completely Able Dress Self Completely Able Feed Self Completely Able Walk Completely Able Get In / Out Bed Completely Able Housework Completely Able Prepare Meals Completely [place] Completely Able Shop for Self Completely Able Electronic  Signature(Price) Signed: 02/15/2023 4:26:45 PM By: [name] RN Entered By: [name] on 02/06/2023 08:13:29 -------------------------------------------------------------------------------- Education Screening Details Patient Name: Date of Service: [name], Karla [name]. 02/06/2023 8:00 A M Medical Price Number: [id number] Patient Account Number: [id number] Date of Birth/Sex: Treating RN: [birth year] (62 y.o. [name], [name] Primary Care [name]: [name] Other Clinician: Referring [name]: Treating [name]/Extender: [name] in TreatmentAUDRA, [name] ([id number]) [phone].pdf Page 2 of 4 Primary Learner Assessed: Patient Learning Preferences/Education Level/Primary Language Learning Preference: Explanation, Demonstration, Communication Board, Printed Material Highest Education Level: High School Preferred Language: Diplomatic Services operational officer Language Barrier: No Translator Needed: No Memory Deficit: No Emotional Barrier: No Cultural/Religious Beliefs Affecting Medical Care: No Physical Barrier Impaired Vision: No Impaired Hearing: No Decreased Hand dexterity: No Knowledge/Comprehension Knowledge Level: High Comprehension Level: High Ability to understand written instructions: High Ability to understand verbal instructions: High Motivation Anxiety Level: Calm Cooperation: Cooperative Education Importance: Denies Need Interest in Health Problems: Asks Questions Perception: Coherent Willingness to Engage in Self-Management High Activities: Readiness to Engage in Self-Management High Activities: Electronic Signature(Price) Signed: 02/15/2023 4:26:45 PM By: [name] RN Entered By: [name] on 02/06/2023 08:13:57 -------------------------------------------------------------------------------- Fall Risk Assessment Details Patient Name: Date of Service: [name], Karla [name].  02/06/2023 8:00 A M Medical Price Number: [id number] Patient Account Number: [id number] Date of Birth/Sex: Treating RN: 24-Feb-1961 (63 y.o. [name], [name] Primary Care [name]: [name] Other Clinician: Referring [name]: Treating [name]/Extender: [name] in Treatment: 0 Fall Risk Assessment Items Have you had 2 or more falls in the last 12 monthso 0 Yes Have you had any fall that resulted in injury in the last 12 monthso 0 No FALLS RISK SCREEN History of falling - immediate or within 3 months 25 Yes Secondary diagnosis (Karla you have 2 or more medical diagnoseso) 15 Yes Ambulatory aid None/bed rest/wheelchair/nurse 0 No  Crutches/cane/walker 15 Yes Furniture 0 No Intravenous therapy Access/Saline/Heparin Lock 0 No Gait/Transferring Normal/ bed rest/ wheelchair 0 No Weak (short steps with or without shuffle, stooped but able to lift head while walking, may seek 0 No support from furniture) Impaired (short steps with shuffle, may have difficulty arising from chair, head down, impaired 0 No balance) Mental Status Oriented to own ability 0 Yes Overestimates or forgets limitations 0 No Risk Level: High Risk Score: 55 [name], [name] ([id number]) [id number] Page 3 of 4 Electronic Signature(Price) -------------------------------------------------------------------------------- Foot Assessment Details Patient Name: Date of Service: [name] 02/06/2023 8:00 A M Medical Price Number: [id number] Patient Account Number: [id number] Date of Birth/Sex: Treating RN: 12-May-1961 (62 y.o. [name], [name] Primary Care [name]: [name] Other Clinician: Referring [name]: Treating [name]/Extender: [name] in Treatment: 0 Foot Assessment Items Site Locations + = Sensation present, - = Sensation absent, C = Callus, U = Ulcer R = Redness, W = Warmth, M = Maceration,  PU = Pre-ulcerative lesion F = Fissure, Price = Swelling, D = Dryness Assessment Right: Left: Other Deformity: No No Prior Foot Ulcer: No No Prior Amputation: Yes No Charcot Joint: No No Ambulatory Status: Ambulatory With Help Assistance Device: Walker Gait: Steady Electronic Signature(Price) Signed: 02/15/2023 4:26:45 PM By: [name] RN Entered By: [name] on 02/06/2023 08:20:21 -------------------------------------------------------------------------------- Nutrition Risk Screening Details Patient Name: Date of Service: [name], Karla [name]. 02/06/2023 8:00 A M Medical Price Number: [id number] Patient Account Number: [id number] Date of Birth/Sex: Treating RN: [birth year] (62 y.o. [name] Primary Care Kirstan Fentress: [name] Other Clinician: Referring Brandell Maready: Treating Rosaisela Jamroz/Extender: [name] in Treatment: 0 Height (in): Weight (lbs): Body Mass Index (BMI): [name], [name] ([id number]) [id number] Page 4 of 4 Nutrition Risk Screening Items Score Screening NUTRITION RISK SCREEN: I have an illness or condition that made me change the kind and/or amount of food I eat 0 No I eat fewer than two meals per day 0 No I eat few fruits and vegetables, or milk products 0 No I have three or more drinks of beer, liquor or wine almost every day 0 No I have tooth or mouth problems that make it hard for me to eat 0 No I don't always have enough money to buy the food I need 0 No I eat alone most of the time 0 No I take three or more different prescribed or over-the-counter drugs a day 0 No Without wanting to, I have lost or gained 10 pounds in the last six months 0 No I am not always physically able to shop, cook and/or feed myself 0 No Nutrition Protocols Good Risk Protocol 0 No interventions needed Moderate Risk Protocol High Risk Proctocol Risk Level: Good Risk Score: 0 Electronic  Signature(Price) Signed: 02/15/2023 4:26:45 PM By: [name] RN Entered By: [name] on 02/06/2023 08:14:45

## 2023-02-16 ENCOUNTER — Telehealth: Payer: Self-pay | Admitting: *Deleted

## 2023-02-16 NOTE — Progress Notes (Signed)
  Care Coordination Note  02/16/2023 Name: [name] MRN: [id number] DOB: [birth year]  [name] is a 62 y.o. year old female who is a primary care patient of Charlott Rakes, MD and is actively engaged with the care management team. I reached out to [name] by phone today to assist with re-scheduling a follow up visit with the RN Case Manager  Follow up plan: Unsuccessful telephone outreach attempt made. A HIPAA compliant phone message was left for the patient providing contact information and requesting a return call.   [name]  Direct Dial: [phone]

## 2023-02-17 DIAGNOSIS — E1165 Type 2 diabetes mellitus with hyperglycemia: Secondary | ICD-10-CM | POA: Diagnosis not present

## 2023-02-20 ENCOUNTER — Encounter: Payer: Self-pay | Admitting: Licensed Clinical Social Worker

## 2023-02-20 ENCOUNTER — Telehealth: Payer: Self-pay | Admitting: Family Medicine

## 2023-02-20 NOTE — Telephone Encounter (Signed)
Copied from CRM [id number]. Topic: General - Inquiry >> Feb 20, 2023  1:01 PM De Blanch wrote: Reason for CRM: Pt stated she had a telephone appointment with [name] this morning at 11 a.m. but did not hear from her.  Pt is calling to f/u .  Please advise.

## 2023-02-20 NOTE — Progress Notes (Signed)
  Care Coordination Note  02/20/2023 Name: RAMANI SENN MRN: [id number] DOB: 1961/05/29  SHENETRA BRAKEMAN is a 61 y.o. year old female who is a primary care patient of [name], MD and is actively engaged with the care management team. I reached out to [name] by phone today to assist with re-scheduling a follow up visit with the RN Case Manager  Follow up plan: Telephone appointment with care management team member scheduled for:02/21/23 [facility] Coordination Care Guide  Direct Dial: [phone]

## 2023-02-21 ENCOUNTER — Ambulatory Visit: Payer: Self-pay

## 2023-02-21 ENCOUNTER — Telehealth: Payer: Self-pay | Admitting: Licensed Clinical Social Worker

## 2023-02-21 NOTE — Patient Outreach (Signed)
  Care Coordination   02/21/2023 Name: ANALYNN BETHARD MRN: [id number] DOB: February 06, 1961   Care Coordination Outreach Attempts:  An unsuccessful telephone outreach was attempted for a scheduled appointment today.  Follow Up Plan:  Additional outreach attempts will be made to offer the patient care coordination information and services.   Encounter Outcome:  No Answer   Care Coordination Interventions:  No, not indicated      Antionette Fairy, RN,BSN,CCM [facility] Care Management Care Management Community Coordinator Direct Phone: [phone] Toll Free: [phone] Fax: [phone]

## 2023-02-21 NOTE — Patient Outreach (Addendum)
  Care Coordination   02/20/23 Name: [name] MRN: [id number] DOB: 05/31/1961   Care Coordination Outreach Attempts:  A second unsuccessful outreach was attempted today to offer the patient with information about available care coordination services as a benefit of their health plan.     Follow Up Plan:  Additional outreach attempts will be made to offer the patient care coordination information and services.   Encounter Outcome:  No Answer   Care Coordination Interventions:  No, not indicated    [name], MSW, LCSW [facility] Care Management [place]  [facility] [email or web address] Phone [phone] 2:14 PM

## 2023-02-22 ENCOUNTER — Ambulatory Visit (HOSPITAL_COMMUNITY)
Admission: RE | Admit: 2023-02-22 | Discharge: 2023-02-22 | Disposition: A | Discharge status: Home / Self Care | Payer: 59 | Source: Ambulatory Visit | Attending: Cardiology | Admitting: Cardiology

## 2023-02-22 DIAGNOSIS — I2510 Atherosclerotic heart disease of native coronary artery without angina pectoris: Secondary | ICD-10-CM | POA: Diagnosis not present

## 2023-02-22 DIAGNOSIS — R42 Dizziness and giddiness: Secondary | ICD-10-CM

## 2023-02-24 ENCOUNTER — Telehealth: Payer: Self-pay | Admitting: *Deleted

## 2023-02-24 NOTE — Progress Notes (Unsigned)
  Care Coordination Note  02/24/2023 Name: [name] MRN: [id number] DOB: March 13, 1961  [name] is a 62 y.o. year old female who is a primary care patient of [name], MD and is actively engaged with the care management team. I reached out to [name] by phone today to assist with re-scheduling a follow up visit with the Licensed Clinical Social Worker  Follow up plan: Unsuccessful telephone outreach attempt made. A HIPAA compliant phone message was left for the patient providing contact information and requesting a return call.   Burman Nieves, CCMA Care Coordination Care Guide Direct Dial: [phone]

## 2023-02-27 ENCOUNTER — Ambulatory Visit (INDEPENDENT_AMBULATORY_CARE_PROVIDER_SITE_OTHER): Payer: 59 | Admitting: Podiatry

## 2023-02-27 ENCOUNTER — Encounter: Payer: Self-pay | Admitting: Podiatry

## 2023-02-27 DIAGNOSIS — L97512 Non-pressure chronic ulcer of other part of right foot with fat layer exposed: Secondary | ICD-10-CM | POA: Diagnosis not present

## 2023-02-27 DIAGNOSIS — E0840 Diabetes mellitus due to underlying condition with diabetic neuropathy, unspecified: Secondary | ICD-10-CM

## 2023-02-27 NOTE — Progress Notes (Signed)
Chief Complaint  Patient presents with   Foot Ulcer    Follow up ulcer 5th met base left and stump right   "The right one has been bleeding some, but I have kept it wrapped"    Subjective:  62 y.o. female with PMHx of T2DM, PVD, CAD presenting for follow-up evaluation of ulcers to the bilateral feet.  Patient has history of right foot TMA.  Currently wearing diabetic shoes with insoles.  Presenting for further treatment and evaluation   Past Medical History:  Diagnosis Date   Alcohol abuse    Alcoholic cirrhosis    Anemia    Anxiety    Arthritis    "knees" (11/26/2018)   B12 deficiency    CAD (coronary artery disease)    a. 11/10/2014 Cath: LM nl, LAD min irregs, D1 30 ost, D2 50d, LCX 49m, OM1 80 p/m (1.5 mm vessel), OM2 52m, RCA nondom 29m-->med rx.. Demand ischemia in the setting of rapid a-fib.   Cardiomyopathy    Carotid artery disease    a. 01/2015 Carotid Angio: RICA 100, LICA 95p; b. 02/2015 s/p L CEA; c. 05/2019 Carotid U/S: RICA 100. RECA >50. LICA 1-39%.   Cellulitis    lower extremities   CHF (congestive heart failure)    Chronic combined systolic and diastolic CHF (congestive heart failure)    a. 10/2014 Echo: EF 40-45%; b. 10/2018 Echo: EF 45-50%, gr2 DD; c. 11/2019 Echo: EF 50%, mild LVH, gr2 DD (restrictive), antlat HK, Nl RV fxn. Mild BAE. RVSP 59.49mmHg.   CKD (chronic kidney disease), stage III    Cocaine abuse    COPD (chronic obstructive pulmonary disease)    Critical lower limb ischemia    Depression    Diabetes mellitus without complication    Diabetic peripheral neuropathy    DVT (deep venous thrombosis)    Dyspnea    Elevated troponin    a. Chronic elevation.   GERD (gastroesophageal reflux disease)    Hyperlipemia    Hypertension    Hypokalemia    Hypomagnesemia    Hypothyroidism    Marijuana abuse    Narcotic abuse    Noncompliance    NSVT (nonsustained ventricular tachycardia)    Obesity    PAF (paroxysmal atrial fibrillation)     Paroxysmal atrial tachycardia    Peripheral arterial disease    a. 01/2015 Angio/PTA: RSFA 99 (atherectomy/pta) - 1 vessel runoff via diff dzs peroneal; b. 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   Pneumonia    "once or twice" (11/26/2018)   Poorly controlled type 2 diabetes mellitus    Renal disorder    Renal insufficiency    a. 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   Sleep apnea    "couldn't handle wearing the mask" (11/26/2018)   Symptomatic bradycardia    a. Avoid AV blocking agent per EP. Prev req temp wire in 2017.   Tobacco abuse     Past Surgical History:  Procedure Laterality Date   ABDOMINAL AORTOGRAM N/A 06/26/2019   Procedure: ABDOMINAL AORTOGRAM;  Surgeon: Iran Ouch, MD;  Location: [facility] INVASIVE CV LAB;  Service: Cardiovascular;  Laterality: N/A;   ABDOMINAL AORTOGRAM W/LOWER EXTREMITY N/A 06/07/2021   Procedure: ABDOMINAL AORTOGRAM W/LOWER EXTREMITY;  Surgeon: Runell Gess, MD;  Location: [facility] INVASIVE CV LAB;  Service: Cardiovascular;  Laterality: N/A;   AMPUTATION Right 06/14/2017   Procedure: Right foot transmetatarsal amputation;  Surgeon: Nadara Mustard, MD;  Location: [facility] OR;  Service: Orthopedics;  Laterality: Right;   AMPUTATION Left 06/28/2019   Procedure: AMPUTATION LEFT FIFTH TOE;  Surgeon: Larina Earthly, MD;  Location: [facility] OR;  Service: Vascular;  Laterality: Left;   AMPUTATION TOE Right 04/28/2017   Procedure: AMPUTATION OF RIGHT SECOND RAY;  Surgeon: Nadara Mustard, MD;  Location: [facility] OR;  Service: Orthopedics;  Laterality: Right;   CARDIAC CATHETERIZATION     CARDIAC CATHETERIZATION N/A 01/12/2016   Procedure: Temporary Wire;  Surgeon: Rollene Rotunda, MD;  Location: [facility]VAS[facility] CV LAB;  Service: Cardiovascular;  Laterality: N/A;   CARDIOVERSION  ~ 02/2013   "twice"    CAROTID ANGIOGRAM N/A 01/15/2015   Procedure: CAROTID ANGIOGRAM;  Surgeon: Runell Gess, MD;  Location: [facility] CATH LAB;  Service: Cardiovascular;  Laterality: N/A;   DILATION AND  CURETTAGE OF UTERUS  1988   ENDARTERECTOMY Left 02/19/2015   Procedure: LEFT CAROTID ENDARTERECTOMY ;  Surgeon: Nada Libman, MD;  Location: [facility] OR;  Service: Vascular;  Laterality: Left;   FEMORAL-TIBIAL BYPASS GRAFT Left 06/28/2019   Procedure: BYPASS GRAFT LEFT LEG FEMORAL TO ANTERIOR TIBIAL ARTERY using LEFT GREATER SAPHENOUS VEIN;  Surgeon: Larina Earthly, MD;  Location: [facility] OR;  Service: Vascular;  Laterality: Left;   LEFT HEART CATHETERIZATION WITH CORONARY ANGIOGRAM N/A 10/31/2014   Procedure: LEFT HEART CATHETERIZATION WITH CORONARY ANGIOGRAM;  Surgeon: Kathleene Hazel, MD;  Location: [facility] CATH LAB;  Service: Cardiovascular;  Laterality: N/A;   LOWER EXTREMITY ANGIOGRAM N/A 09/10/2013   Procedure: LOWER EXTREMITY ANGIOGRAM;  Surgeon: Runell Gess, MD;  Location: [facility] CATH LAB;  Service: Cardiovascular;  Laterality: N/A;   LOWER EXTREMITY ANGIOGRAM N/A 01/15/2015   Procedure: LOWER EXTREMITY ANGIOGRAM;  Surgeon: Runell Gess, MD;  Location: [facility] CATH LAB;  Service: Cardiovascular;  Laterality: N/A;   LOWER EXTREMITY ANGIOGRAPHY N/A 04/13/2017   Procedure: Lower Extremity Angiography;  Surgeon: Runell Gess, MD;  Location: [facility] INVASIVE CV LAB;  Service: Cardiovascular;  Laterality: N/A;   LOWER EXTREMITY ANGIOGRAPHY Left 06/26/2019   Procedure: LOWER EXTREMITY ANGIOGRAPHY;  Surgeon: Iran Ouch, MD;  Location: [facility];  Service: Cardiovascular;  Laterality: Left;   PERIPHERAL VASCULAR ATHERECTOMY Right 06/07/2021   Procedure: PERIPHERAL VASCULAR ATHERECTOMY;  Surgeon: Runell Gess, MD;  Location: [facility] INVASIVE CV LAB;  Service: Cardiovascular;  Laterality: Right;   PERIPHERAL VASCULAR BALLOON ANGIOPLASTY Left 06/26/2019   Procedure: PERIPHERAL VASCULAR BALLOON ANGIOPLASTY;  Surgeon: Iran Ouch, MD;  Location: [facility];  Service: Cardiovascular;  Laterality: Left;  unable to cross lt sfa occlusion   PERIPHERAL VASCULAR INTERVENTION  04/13/2017    Procedure: Peripheral Vascular Intervention;  Surgeon: Runell Gess, MD;  Location: [facility] INVASIVE CV LAB;  Service: Cardiovascular;;   PRESSURE SENSOR/CARDIOMEMS N/A 02/05/2020   Procedure: PRESSURE SENSOR/CARDIOMEMS;  Surgeon: Laurey Morale, MD;  Location: [facility] INVASIVE CV LAB;  Service: Cardiovascular;  Laterality: N/A;   VEIN HARVEST Left 06/28/2019   Procedure: LEFT LEG GREATER SAPHENOUS VEIN HARVEST;  Surgeon: Larina Earthly, MD;  Location: [facility] OR;  Service: Vascular;  Laterality: Left;    Allergies  Allergen Reactions   Gabapentin Nausea And Vomiting and Other (See Comments)    POSSIBLE SHAKING   Lyrica [Pregabalin] Other (See Comments)    Shaking     LT foot 02/27/2023  RT foot 02/27/2023  Objective/Physical Exam General: The patient is alert and oriented x3 in no acute distress.  Dermatology:  Wound #1 noted to the plantar aspect  of the right forefoot stump measuring approximately 3.0 x 3.0 x 0.2 cm (LxWxD).  Wound #2 noted around the fifth metatarsal tubercle left foot measuring approximately 1.5 x 1.5 x 0.2 cm.  To the noted ulceration(s), there is no eschar. There is a moderate amount of slough, fibrin, and necrotic tissue noted. Granulation tissue and wound base is red. There is a minimal amount of serosanguineous drainage noted. There is no exposed bone muscle-tendon ligament or joint. There is no malodor. Periwound integrity is intact. Skin is warm, dry and supple bilateral lower extremities.  Vascular: VAS [place] ABI W/WO TBI 01/11/2023 ABI Findings:  +---------+------------------+-----+----------+----------+  Right   Rt Pressure (mmHg)IndexWaveform  Comment     +---------+------------------+-----+----------+----------+  Brachial 106                                          +---------+------------------+-----+----------+----------+  PTA     0                 0.00 absent                +---------+------------------+-----+----------+----------+  DP       60                0.57 monophasic            +---------+------------------+-----+----------+----------+  Great Toe                                 amputation  +---------+------------------+-----+----------+----------+   +---------+------------------+-----+-------------------+-------+  Left    Lt Pressure (mmHg)IndexWaveform           Comment  +---------+------------------+-----+-------------------+-------+  Brachial 104                                                +---------+------------------+-----+-------------------+-------+  PTA     88                0.83 dampened monophasic         +---------+------------------+-----+-------------------+-------+  DP      110               1.04 triphasic                   +---------+------------------+-----+-------------------+-------+  Great Toe65                0.61 Abnormal                    +---------+------------------+-----+-------------------+-------+   +-------+-----------+-----------+------------+------------+  ABI/TBIToday's ABIToday's TBIPrevious ABIPrevious TBI  +-------+-----------+-----------+------------+------------+  Right .57        amputation .69         amputation    +-------+-----------+-----------+------------+------------+  Left  1.04       .61        .94         1.0           +-------+-----------+-----------+------------+------------+  Right ABIs appear essentially unchanged compared to prior study on  12/22/2021. Left ABIs appear increased compared to prior study on  12/22/2021. Left TBIs appear decreased compared to prior study on  12/22/2021. Right transmetatarsal amputation.    Summary:  Right: Resting right ankle-brachial index indicates  moderate right lower  extremity arterial disease.   Transmetatarsal amputation.  Left: Resting left ankle-brachial index is within normal range. The left  toe-brachial index is abnormal.   Neurological: Diminished via  light touch  Musculoskeletal Exam: Prior history of TMA RLE.  Partial fifth ray amputation LLE.  Assessment: 1.  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 diabetes mellitus w/ peripheral neuropathy; poorly controlled.  Last A1c 09/13/2022 9.6   Plan of Care:  1. Patient was evaluated. 2. medically necessary excisional debridement including subcutaneous tissue was performed using a tissue nipper and a chisel blade. Excisional debridement of all the necrotic nonviable tissue down to healthy bleeding viable tissue was performed with post-debridement measurements same as pre-. 3. the wound was cleansed and dry sterile dressing applied. 4.  Recommend Betadine ointment daily to the wounds with a dressing.  Betadine ointment provided for the patient today  5.  Patient states that her diabetic shoes and insoles are pending.  Order has already been placed according to the patient.  In the meantime continue current diabetic shoes with insoles 6.  Continue close management with interventional cardiology, Dr. Nanetta Batty MD 7.  Patient is to return to clinic in 3 weeks  Felecia Shelling, DPM [facility]  Dr. Felecia Shelling, DPM    [address] [place], [place] [address]                Office [phone]  Fax [phone]

## 2023-03-01 ENCOUNTER — Other Ambulatory Visit (HOSPITAL_COMMUNITY): Payer: Self-pay | Admitting: Cardiology

## 2023-03-01 ENCOUNTER — Other Ambulatory Visit: Payer: Self-pay | Admitting: Physician Assistant

## 2023-03-01 ENCOUNTER — Other Ambulatory Visit (HOSPITAL_COMMUNITY): Payer: 59

## 2023-03-01 ENCOUNTER — Other Ambulatory Visit: Payer: Self-pay | Admitting: Family Medicine

## 2023-03-01 DIAGNOSIS — I10 Essential (primary) hypertension: Secondary | ICD-10-CM

## 2023-03-01 DIAGNOSIS — M109 Gout, unspecified: Secondary | ICD-10-CM

## 2023-03-02 ENCOUNTER — Ambulatory Visit (HOSPITAL_COMMUNITY)
Admission: RE | Admit: 2023-03-02 | Discharge: 2023-03-02 | Disposition: A | Discharge status: Home / Self Care | Payer: 59 | Source: Ambulatory Visit | Attending: Cardiology | Admitting: Cardiology

## 2023-03-02 ENCOUNTER — Ambulatory Visit (HOSPITAL_COMMUNITY)
Admission: RE | Admit: 2023-03-02 | Discharge: 2023-03-02 | Disposition: A | Discharge status: Home / Self Care | Payer: 59 | Source: Ambulatory Visit | Attending: Internal Medicine | Admitting: Internal Medicine

## 2023-03-02 DIAGNOSIS — E785 Hyperlipidemia, unspecified: Secondary | ICD-10-CM | POA: Insufficient documentation

## 2023-03-02 DIAGNOSIS — I5042 Chronic combined systolic (congestive) and diastolic (congestive) heart failure: Secondary | ICD-10-CM | POA: Insufficient documentation

## 2023-03-02 DIAGNOSIS — I110 Hypertensive heart disease with heart failure: Secondary | ICD-10-CM | POA: Diagnosis not present

## 2023-03-02 DIAGNOSIS — I2510 Atherosclerotic heart disease of native coronary artery without angina pectoris: Secondary | ICD-10-CM | POA: Diagnosis not present

## 2023-03-02 DIAGNOSIS — J449 Chronic obstructive pulmonary disease, unspecified: Secondary | ICD-10-CM | POA: Insufficient documentation

## 2023-03-02 DIAGNOSIS — F17200 Nicotine dependence, unspecified, uncomplicated: Secondary | ICD-10-CM | POA: Insufficient documentation

## 2023-03-02 DIAGNOSIS — E119 Type 2 diabetes mellitus without complications: Secondary | ICD-10-CM | POA: Insufficient documentation

## 2023-03-02 DIAGNOSIS — G4730 Sleep apnea, unspecified: Secondary | ICD-10-CM | POA: Insufficient documentation

## 2023-03-02 DIAGNOSIS — I5032 Chronic diastolic (congestive) heart failure: Secondary | ICD-10-CM

## 2023-03-02 LAB — BASIC METABOLIC PANEL
Anion gap: 12 (ref 5–15)
BUN: 49 mg/dL — ABNORMAL HIGH (ref 6–20)
CO2: 23 mmol/L (ref 22–32)
Calcium: 8.7 mg/dL — ABNORMAL LOW (ref 8.9–10.3)
Chloride: 103 mmol/L (ref 98–111)
Creatinine, Ser: 1.77 mg/dL — ABNORMAL HIGH (ref 0.44–1.00)
GFR, Estimated: 33 mL/min — ABNORMAL LOW (ref >60)
Glucose, Bld: 299 mg/dL — ABNORMAL HIGH (ref 70–99)
Potassium: 4.5 mmol/L (ref 3.5–5.1)
Sodium: 138 mmol/L (ref 135–145)

## 2023-03-02 LAB — ECHOCARDIOGRAM COMPLETE
Area-P 1/2: 4.39 cm2
Calc EF: 54.4 %
S' Lateral: 3.2 cm
Single Plane A2C EF: 54.7 %
Single Plane A4C EF: 51.3 %

## 2023-03-02 LAB — BRAIN NATRIURETIC PEPTIDE: B Natriuretic Peptide: 310.5 pg/mL — ABNORMAL HIGH (ref 0.0–100.0)

## 2023-03-02 NOTE — Progress Notes (Signed)
Echocardiogram 2D Echocardiogram has been performed.  [name] 03/02/2023, 2:06 PM

## 2023-03-02 NOTE — Progress Notes (Signed)
  Care Coordination Note  03/02/2023 Name: [name] MRN: [id number] DOB: 04/07/1961  [name] is a 62 y.o. year old female who is a primary care patient of [name], MD and is actively engaged with the care management team. I reached out to [name] by phone today to assist with re-scheduling a follow up visit with the Licensed Clinical Social Worker  Follow up plan: Telephone appointment with care management team member scheduled for: 03/08/2023  Burman Nieves, [facility] Care Coordination Care Guide Direct Dial: [phone]

## 2023-03-03 ENCOUNTER — Telehealth (HOSPITAL_COMMUNITY): Payer: Self-pay

## 2023-03-03 NOTE — Telephone Encounter (Signed)
Patient aware of echo results.

## 2023-03-08 ENCOUNTER — Encounter: Payer: Self-pay | Admitting: Licensed Clinical Social Worker

## 2023-03-17 ENCOUNTER — Telehealth: Payer: Self-pay | Admitting: Licensed Clinical Social Worker

## 2023-03-19 DIAGNOSIS — E1165 Type 2 diabetes mellitus with hyperglycemia: Secondary | ICD-10-CM | POA: Diagnosis not present

## 2023-03-20 ENCOUNTER — Ambulatory Visit (INDEPENDENT_AMBULATORY_CARE_PROVIDER_SITE_OTHER): Payer: 59

## 2023-03-20 ENCOUNTER — Emergency Department (HOSPITAL_COMMUNITY): Payer: 59

## 2023-03-20 ENCOUNTER — Other Ambulatory Visit: Payer: Self-pay

## 2023-03-20 ENCOUNTER — Ambulatory Visit (INDEPENDENT_AMBULATORY_CARE_PROVIDER_SITE_OTHER): Payer: 59 | Admitting: Podiatry

## 2023-03-20 ENCOUNTER — Emergency Department (HOSPITAL_COMMUNITY)
Admission: EM | Admit: 2023-03-20 | Discharge: 2023-03-20 | Disposition: A | Discharge status: Home / Self Care | Payer: 59 | Attending: Emergency Medicine | Admitting: Emergency Medicine

## 2023-03-20 ENCOUNTER — Encounter (HOSPITAL_COMMUNITY): Payer: Self-pay

## 2023-03-20 DIAGNOSIS — L97512 Non-pressure chronic ulcer of other part of right foot with fat layer exposed: Secondary | ICD-10-CM

## 2023-03-20 DIAGNOSIS — M545 Low back pain, unspecified: Secondary | ICD-10-CM | POA: Diagnosis not present

## 2023-03-20 DIAGNOSIS — E1122 Type 2 diabetes mellitus with diabetic chronic kidney disease: Secondary | ICD-10-CM | POA: Insufficient documentation

## 2023-03-20 DIAGNOSIS — Z794 Long term (current) use of insulin: Secondary | ICD-10-CM | POA: Insufficient documentation

## 2023-03-20 DIAGNOSIS — M25551 Pain in right hip: Secondary | ICD-10-CM | POA: Diagnosis not present

## 2023-03-20 DIAGNOSIS — M25552 Pain in left hip: Secondary | ICD-10-CM

## 2023-03-20 DIAGNOSIS — Z7984 Long term (current) use of oral hypoglycemic drugs: Secondary | ICD-10-CM | POA: Diagnosis not present

## 2023-03-20 DIAGNOSIS — N189 Chronic kidney disease, unspecified: Secondary | ICD-10-CM | POA: Diagnosis not present

## 2023-03-20 DIAGNOSIS — Z7901 Long term (current) use of anticoagulants: Secondary | ICD-10-CM | POA: Insufficient documentation

## 2023-03-20 DIAGNOSIS — M25572 Pain in left ankle and joints of left foot: Secondary | ICD-10-CM | POA: Diagnosis not present

## 2023-03-20 DIAGNOSIS — I2510 Atherosclerotic heart disease of native coronary artery without angina pectoris: Secondary | ICD-10-CM | POA: Insufficient documentation

## 2023-03-20 DIAGNOSIS — E0840 Diabetes mellitus due to underlying condition with diabetic neuropathy, unspecified: Secondary | ICD-10-CM

## 2023-03-20 DIAGNOSIS — Z7982 Long term (current) use of aspirin: Secondary | ICD-10-CM | POA: Insufficient documentation

## 2023-03-20 MED ORDER — CYCLOBENZAPRINE HCL 10 MG PO TABS: 10.0000 mg | ORAL_TABLET | Freq: Two times a day (BID) | ORAL | 0 refills | Status: DC | PRN

## 2023-03-20 MED ORDER — LIDOCAINE 5 % EX PTCH: 1.0000 | MEDICATED_PATCH | CUTANEOUS | 0 refills | Status: DC

## 2023-03-20 MED ORDER — LIDOCAINE 5 % EX PTCH
1.0000 | MEDICATED_PATCH | CUTANEOUS | Status: DC
  Administered 2023-03-20: 1 via TRANSDERMAL
  Filled 2023-03-20: qty 1

## 2023-03-20 MED ORDER — CYCLOBENZAPRINE HCL 10 MG PO TABS
5.0000 mg | ORAL_TABLET | Freq: Once | ORAL | Status: AC
  Administered 2023-03-20: 5 mg via ORAL
  Filled 2023-03-20: qty 1

## 2023-03-20 MED ORDER — OXYCODONE-ACETAMINOPHEN 5-325 MG PO TABS
1.0000 | ORAL_TABLET | Freq: Once | ORAL | Status: AC
  Administered 2023-03-20: 1 via ORAL
  Filled 2023-03-20: qty 1

## 2023-03-20 NOTE — Discharge Instructions (Signed)
Overall suspect possibly muscle spasm.  I prescribed you muscle relaxant called Flexeril to take as needed.  Have also prescribed lidocaine patches.  Recommend 1000 mg of Tylenol every 6 hours as needed for pain.  Follow-up with your primary care doctor.

## 2023-03-20 NOTE — ED Triage Notes (Signed)
Pt arrived via ems from home c/o right hip pain x 1 day denies fall denies injury states it hurts too bad to stand

## 2023-03-20 NOTE — Patient Outreach (Signed)
  Care Coordination   Follow Up Visit Note   03/17/2023 Name: [name] MRN: [id number] DOB: 1961/05/07  [name] is a 62 y.o. year old female who sees [name], MD for primary care. I spoke with  [name] by phone today.  What matters to the patients health and wellness today?  Symptom Management/Review appts/specialists    Goals Addressed             This Visit's Progress    Obtain Assistance with Wheelchair   On track    Activities and task to complete in order to accomplish goals.   Keep all upcoming appointments discussed today Continue with compliance of taking medication prescribed by Doctor Implement healthy coping skills discussed to assist with management of symptoms Continue utilizing [facility] for transportation needs to medical appts Contact LCSW or RNCM at [facility] if you are interested in having a Legal Aid referral completed to assist with housing needs Follow up with [facility]        SDOH assessments and interventions completed:  No     Care Coordination Interventions:  Yes, provided  Interventions Today    Flowsheet Row Most Recent Value  Chronic Disease   Chronic disease during today's visit Diabetes, Congestive Heart Failure (CHF), Hypertension (HTN), Chronic Obstructive Pulmonary Disease (COPD)  General Interventions   General Interventions Discussed/Reviewed General Interventions Reviewed, Doctor Visits  [Pt reports she is retaining fluid in her knee and feet. Strategies to assist discussed (compression socks, elevating feet, modifying diet) Pt agreed to reach out to [facility] and [facility]]  Doctor Visits Discussed/Reviewed Doctor Visits Discussed, Specialist  Mental Health Interventions   Mental Health Discussed/Reviewed Mental Health Reviewed, Coping Strategies  Nutrition Interventions   Nutrition Discussed/Reviewed Nutrition Discussed, Decreasing salt  Pharmacy Interventions   Pharmacy Dicussed/Reviewed Medication Adherence        Follow up plan: Follow up call scheduled for 2-4 weeks    Encounter Outcome:  Pt. Visit Completed   [name], MSW, LCSW [facility] Care Management [facility]  [facility] [email or web address] Phone [phone] 4:16 PM

## 2023-03-20 NOTE — Progress Notes (Signed)
Chief Complaint  Patient presents with   Diabetic Ulcer    Patient came in today for right foot diabetic ulcer, bleeding and drainage, A1c- 9.6 BG- 149, rate of pain 10 out of 10, X-Rays done today     Subjective:  62 y.o. female with PMHx of T2DM, PVD, CAD presenting for follow-up evaluation of ulcers to the bilateral feet.  Patient has history of right foot TMA.  Currently wearing diabetic shoes with insoles.  She has been applying the Betadine with dressings daily.  Presenting for further treatment and evaluation   Past Medical History:  Diagnosis Date   Alcohol abuse    Alcoholic cirrhosis ([name])    Anemia    Anxiety    Arthritis    "knees" (11/26/2018)   B12 deficiency    CAD (coronary artery disease)    a. 11/10/2014 Cath: LM nl, LAD min irregs, D1 30 ost, D2 50d, LCX 8m, OM1 80 p/m (1.5 mm vessel), OM2 49m, RCA nondom 10m-->med rx.. Demand ischemia in the setting of rapid a-fib.   Cardiomyopathy ([name])    Carotid artery disease ([name])    a. 01/2015 Carotid Angio: RICA 100, LICA 95p; b. 02/2015 s/p L CEA; c. 05/2019 Carotid U/S: RICA 100. RECA >50. LICA 1-39%.   Cellulitis    lower extremities   CHF (congestive heart failure) ([name])    Chronic combined systolic and diastolic CHF (congestive heart failure) ([name])    a. 10/2014 Echo: EF 40-45%; b. 10/2018 Echo: EF 45-50%, gr2 DD; c. 11/2019 Echo: EF 50%, mild LVH, gr2 DD (restrictive), antlat HK, Nl RV fxn. Mild BAE. RVSP 59.72mmHg.   CKD (chronic kidney disease), stage III ([name])    Cocaine abuse ([name])    COPD (chronic obstructive pulmonary disease) ([name])    Critical lower limb ischemia ([name])    Depression    Diabetes mellitus without complication ([name])    Diabetic peripheral neuropathy ([name])    DVT (deep venous thrombosis) ([name])    Dyspnea    Elevated troponin    a. Chronic elevation.   GERD (gastroesophageal reflux disease)    Hyperlipemia    Hypertension    Hypokalemia    Hypomagnesemia    Hypothyroidism    Marijuana  abuse    Narcotic abuse ([name])    Noncompliance    NSVT (nonsustained ventricular tachycardia) ([name])    Obesity    PAF (paroxysmal atrial fibrillation) ([name])    Paroxysmal atrial tachycardia    Peripheral arterial disease ([name])    a. 01/2015 Angio/PTA: RSFA 99 (atherectomy/pta) - 1 vessel runoff via diff dzs peroneal; b. 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   Pneumonia    "once or twice" (11/26/2018)   Poorly controlled type 2 diabetes mellitus ([name])    Renal disorder    Renal insufficiency    a. 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   Sleep apnea    "couldn't handle wearing the mask" (11/26/2018)   Symptomatic bradycardia    a. Avoid AV blocking agent per EP. Prev req temp wire in 2017.   Tobacco abuse     Past Surgical History:  Procedure Laterality Date   ABDOMINAL AORTOGRAM N/A 06/26/2019   Procedure: ABDOMINAL AORTOGRAM;  Surgeon: Iran Ouch, MD;  [facility]: [facility] INVASIVE CV LAB;  Service: Cardiovascular;  Laterality: N/A;   ABDOMINAL AORTOGRAM W/LOWER EXTREMITY N/A 06/07/2021   Procedure: ABDOMINAL AORTOGRAM W/LOWER EXTREMITY;  Surgeon: Runell Gess, MD;  Location: [facility] INVASIVE CV  LAB;  Service: Cardiovascular;  Laterality: N/A;   AMPUTATION Right 06/14/2017   Procedure: Right foot transmetatarsal amputation;  Surgeon: Nadara Mustard, MD;  Location: [facility] OR;  Service: Orthopedics;  Laterality: Right;   AMPUTATION Left 06/28/2019   Procedure: AMPUTATION LEFT FIFTH TOE;  Surgeon: Larina Earthly, MD;  Location: [facility] OR;  Service: Vascular;  Laterality: Left;   AMPUTATION TOE Right 04/28/2017   Procedure: AMPUTATION OF RIGHT SECOND RAY;  Surgeon: Nadara Mustard, MD;  Location: [facility] OR;  Service: Orthopedics;  Laterality: Right;   CARDIAC CATHETERIZATION     CARDIAC CATHETERIZATION N/A 01/12/2016   Procedure: Temporary Wire;  Surgeon: Rollene Rotunda, MD;  Location: [facility] INVASIVE CV LAB;  Service: Cardiovascular;  Laterality: N/A;   CARDIOVERSION  ~ 02/2013   "twice"    CAROTID  ANGIOGRAM N/A 01/15/2015   Procedure: CAROTID ANGIOGRAM;  Surgeon: Runell Gess, MD;  Location: [facility] CATH LAB;  Service: Cardiovascular;  Laterality: N/A;   DILATION AND CURETTAGE OF UTERUS  1988   ENDARTERECTOMY Left 02/19/2015   Procedure: LEFT CAROTID ENDARTERECTOMY ;  Surgeon: Nada Libman, MD;  Location: [facility] OR;  Service: Vascular;  Laterality: Left;   FEMORAL-TIBIAL BYPASS GRAFT Left 06/28/2019   Procedure: BYPASS GRAFT LEFT LEG FEMORAL TO ANTERIOR TIBIAL ARTERY using LEFT GREATER SAPHENOUS VEIN;  Surgeon: Larina Earthly, MD;  Location: [facility] OR;  Service: Vascular;  Laterality: Left;   LEFT HEART CATHETERIZATION WITH CORONARY ANGIOGRAM N/A 10/31/2014   Procedure: LEFT HEART CATHETERIZATION WITH CORONARY ANGIOGRAM;  Surgeon: Kathleene Hazel, MD;  Location: [facility] CATH LAB;  Service: Cardiovascular;  Laterality: N/A;   LOWER EXTREMITY ANGIOGRAM N/A 09/10/2013   Procedure: LOWER EXTREMITY ANGIOGRAM;  Surgeon: Runell Gess, MD;  Location: [facility] CATH LAB;  Service: Cardiovascular;  Laterality: N/A;   LOWER EXTREMITY ANGIOGRAM N/A 01/15/2015   Procedure: LOWER EXTREMITY ANGIOGRAM;  Surgeon: Runell Gess, MD;  Location: [facility] CATH LAB;  Service: Cardiovascular;  Laterality: N/A;   LOWER EXTREMITY ANGIOGRAPHY N/A 04/13/2017   Procedure: Lower Extremity Angiography;  Surgeon: Runell Gess, MD;  Location: [facility] INVASIVE CV LAB;  Service: Cardiovascular;  Laterality: N/A;   LOWER EXTREMITY ANGIOGRAPHY Left 06/26/2019   Procedure: LOWER EXTREMITY ANGIOGRAPHY;  Surgeon: Iran Ouch, MD;  Location: [facility] INVASIVE CV LAB;  Service: Cardiovascular;  Laterality: Left;   PERIPHERAL VASCULAR ATHERECTOMY Right 06/07/2021   Procedure: PERIPHERAL VASCULAR ATHERECTOMY;  Surgeon: Runell Gess, MD;  Location: [facility] INVASIVE CV LAB;  Service: Cardiovascular;  Laterality: Right;   PERIPHERAL VASCULAR BALLOON ANGIOPLASTY Left 06/26/2019   Procedure: PERIPHERAL VASCULAR BALLOON ANGIOPLASTY;  Surgeon: Iran Ouch, MD;  Location: [facility] INVASIVE CV LAB;  Service: Cardiovascular;  Laterality: Left;  unable to cross lt sfa occlusion   PERIPHERAL VASCULAR INTERVENTION  04/13/2017   Procedure: Peripheral Vascular Intervention;  Surgeon: Runell Gess, MD;  Location: [facility] INVASIVE CV LAB;  Service: Cardiovascular;;   PRESSURE SENSOR/CARDIOMEMS N/A 02/05/2020   Procedure: PRESSURE SENSOR/CARDIOMEMS;  Surgeon: Laurey Morale, MD;  Location: [facility] INVASIVE CV LAB;  Service: Cardiovascular;  Laterality: N/A;   VEIN HARVEST Left 06/28/2019   Procedure: LEFT LEG GREATER SAPHENOUS VEIN HARVEST;  Surgeon: Larina Earthly, MD;  Location: [facility] OR;  Service: Vascular;  Laterality: Left;    Allergies  Allergen Reactions   Gabapentin Nausea And Vomiting and Other (See Comments)    POSSIBLE SHAKING   Lyrica [Pregabalin] Other (See Comments)    Shaking     LT foot  02/27/2023  RT foot 02/27/2023  Objective/Physical Exam General: The patient is alert and oriented x3 in no acute distress.  Dermatology:  Mostly unchanged.  Wound #1 noted to the plantar aspect of the right forefoot stump measuring approximately 3.0 x 3.0 x 0.2 cm (LxWxD).  Wound #2 noted around the fifth metatarsal tubercle left foot measuring approximately 1.5 x 1.5 x 0.2 cm.  To the noted ulceration(s), there is no eschar. There is a moderate amount of slough, fibrin, and necrotic tissue noted. Granulation tissue and wound base is red. There is a minimal amount of serosanguineous drainage noted. There is no exposed bone muscle-tendon ligament or joint. There is no malodor. Periwound integrity is intact. Skin is warm, dry and supple bilateral lower extremities.  Vascular: VAS [place] ABI W/WO TBI 01/11/2023 ABI Findings:  +---------+------------------+-----+----------+----------+  Right   Rt Pressure (mmHg)IndexWaveform  Comment     +---------+------------------+-----+----------+----------+  Brachial 106                                           +---------+------------------+-----+----------+----------+  PTA     0                 0.00 absent                +---------+------------------+-----+----------+----------+  DP      60                0.57 monophasic            +---------+------------------+-----+----------+----------+  Great Toe                                 amputation  +---------+------------------+-----+----------+----------+   +---------+------------------+-----+-------------------+-------+  Left    Lt Pressure (mmHg)IndexWaveform           Comment  +---------+------------------+-----+-------------------+-------+  Brachial 104                                                +---------+------------------+-----+-------------------+-------+  PTA     88                0.83 dampened monophasic         +---------+------------------+-----+-------------------+-------+  DP      110               1.04 triphasic                   +---------+------------------+-----+-------------------+-------+  Great Toe65                0.61 Abnormal                    +---------+------------------+-----+-------------------+-------+   +-------+-----------+-----------+------------+------------+  ABI/TBIToday's ABIToday's TBIPrevious ABIPrevious TBI  +-------+-----------+-----------+------------+------------+  Right .57        amputation .69         amputation    +-------+-----------+-----------+------------+------------+  Left  1.04       .61        .94         1.0           +-------+-----------+-----------+------------+------------+  Right ABIs appear essentially unchanged compared to prior study on  12/22/2021. Left ABIs appear  increased compared to prior study on  12/22/2021. Left TBIs appear decreased compared to prior study on  12/22/2021. Right transmetatarsal amputation.    Summary:  Right: Resting right ankle-brachial index indicates moderate right lower  extremity  arterial disease.   Transmetatarsal amputation.  Left: Resting left ankle-brachial index is within normal range. The left  toe-brachial index is abnormal.   Neurological: Diminished via light touch  Musculoskeletal Exam: Prior history of TMA RLE.  Partial fifth ray amputation LLE.  Assessment: 1.  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 diabetes mellitus w/ peripheral neuropathy; poorly controlled.  Last A1c 09/13/2022 9.6 3.  Peripheral vascular disease   Plan of Care:  1. Patient was evaluated. 2. medically necessary excisional debridement including subcutaneous tissue was performed using a tissue nipper and a chisel blade. Excisional debridement of all the necrotic nonviable tissue down to healthy bleeding viable tissue was performed with post-debridement measurements same as pre-. 3. the wound was cleansed and dry sterile dressing applied. 4.  Continue Betadine ointment daily to the wounds with a dressing.  Betadine ointment provided for the patient today  5.  Patient has a history of the [facility] wound care center.  I do believe she would benefit from weekly wound care debridement and management.  Referral placed.  Always appreciated. 6.  Continue close management with interventional cardiology, Dr. Nanetta Batty MD 7.  Patient is to return to clinic as needed  Felecia Shelling, DPM [facility]  Dr. Felecia Shelling, DPM    [address] [place], [place] [address]                Office [phone]  Fax [phone]

## 2023-03-20 NOTE — Patient Instructions (Signed)
Visit Information  Thank you for taking time to visit with me today. Please don't hesitate to contact me if I can be of assistance to you.   Following are the goals we discussed today:   Goals Addressed             This Visit's Progress    Obtain Assistance with Wheelchair   On track    Activities and task to complete in order to accomplish goals.   Keep all upcoming appointments discussed today Continue with compliance of taking medication prescribed by Doctor Implement healthy coping skills discussed to assist with management of symptoms Continue utilizing [facility] for transportation needs to medical appts Contact LCSW or RNCM at [facility] if you are interested in having a Legal Aid referral completed to assist with housing needs Follow up with [facility]        Our next appointment is by telephone on 5/29 at 1 PM  Please call the care guide team at [phone] if you need to cancel or reschedule your appointment.   If you are experiencing a Mental Health or Behavioral Health Crisis or need someone to talk to, please call the Suicide and Crisis Lifeline: 988 call 911   Patient verbalizes understanding of instructions and care plan provided today and agrees to view in MyChart. Active MyChart status and patient understanding of how to access instructions and care plan via MyChart confirmed with patient.     [name], MSW, LCSW [facility] Care Management [place]  [facility] [email or web address] Phone [phone] 4:19 PM

## 2023-03-20 NOTE — ED Notes (Signed)
Dc instructions reviewed with pt. PT verbalized understanding. PT dC

## 2023-03-20 NOTE — ED Provider Notes (Signed)
[place] EMERGENCY DEPARTMENT AT [facility] Provider Note   CSN: [id number] Arrival date & time: 03/20/23  7829     History  Chief Complaint  Patient presents with   Hip Pain    Pt arrived via ems from home c/o right hip pain x 1 day denies fall denies injury    [name] is a 62 y.o. female.  Patient here with left hip pain.  Triage note is wrong and patient with left hip pain.  Acute on chronic.  Sometimes shooting pain down to the left foot.  History of diabetes, chronic pain, alcoholic cirrhosis, CAD, peripheral arterial disease, paroxysmal A-fib, CKD.  She denies any loss of bowel or bladder.  She mostly uses a wheelchair but also walker at times.  She has no issues going to the bathroom.  Denies any trauma.  No chest pain or shortness of breath or abdominal pain.  The history is provided by the patient.       Home Medications Prior to Admission medications   Medication Sig Start Date End Date Taking? Authorizing Provider  cyclobenzaprine (FLEXERIL) 10 MG tablet Take 1 tablet (10 mg total) by mouth 2 (two) times daily as needed for muscle spasms. 03/20/23  Yes Serine Kea, DO  lidocaine (LIDODERM) 5 % Place 1 patch onto the skin daily. Remove & Discard patch within 12 hours or as directed by MD 03/20/23  Yes Soundra Lampley, DO  ACCU-CHEK GUIDE test strip USE TO CHECK BLOOD SUGAR FOUR TIMES DAILY 02/25/20   [provider]  acetaminophen (TYLENOL) 500 MG tablet Take 1,000 mg by mouth every 6 (six) hours as needed for headache (pain).    [provider]  albuterol (VENTOLIN HFA) 108 (90 Base) MCG/ACT inhaler USE 2 PUFFS BY MOUTH EVERY FOUR HOURS, AS NEEDED, FOR COUGHING/WHEEZING 07/12/22   [name], MD  allopurinol (ZYLOPRIM) 100 MG tablet TAKE 1 TABLET (100 MG TOTAL) BY MOUTH DAILY(AM) 03/02/23   [name], MD  amiodarone (PACERONE) 200 MG tablet TAKE 1/2 TABLET (100 MG TOTAL) BY MOUTH DAILY (AM) 12/05/22   Laurey Morale, MD   amLODipine (NORVASC) 10 MG tablet TAKE 1 TABLET (10 MG TOTAL) BY MOUTH EVERY MORNING. 03/01/23   [name], MD  Ascorbic Acid (VITAMIN C PO) Take 1 tablet by mouth every morning.    [provider]  Aspirin-Salicylamide-Caffeine (BC HEADACHE POWDER PO) Take 1 packet by mouth 4 (four) times daily as needed (pain/headache).    [provider]  budesonide-formoterol (SYMBICORT) 160-4.5 MCG/ACT inhaler Inhale 2 puffs into the lungs 2 (two) times daily. 07/07/22   Anders Simmonds, PA-C  buPROPion (WELLBUTRIN SR) 150 MG 12 hr tablet TAKE 1 TABLET (150 MG TOTAL) BY MOUTH 2 (TWO) TIMES DAILY. (AM+ BEDTIME) 03/01/23   Laurey Morale, MD  Cholecalciferol (VITAMIN D3 PO) Take 1 capsule by mouth every morning.    [provider]  colchicine 0.6 MG tablet TAKE 2 TABS (1.2 MG) AT THE ONSET OF A GOUT FLARE, MAY TAKE 1 TAB (0.6 MG) IN 1 HOUR IF SYMPTOMS 01/11/23   [name], MD  Continuous Blood Gluc Sensor (FREESTYLE LIBRE 2 SENSOR) MISC USE 1 (ONE) EACH EVERY 2 WEEKS Patient not taking: Reported on 02/09/2023 07/14/21   [name], MD  doxycycline (VIBRAMYCIN) 100 MG capsule Take 1 capsule (100 mg total) by mouth 2 (two) times daily. Patient not taking: Reported on 02/09/2023 01/24/23   Gilda Crease, MD  Dulaglutide (TRULICITY) 0.75 MG/0.5ML  SOPN Inject 0.75 mg into the skin every Friday. 07/08/22   Anders Simmonds, PA-C  DULoxetine (CYMBALTA) 60 MG capsule TAKE 1 CAPSULE (60 MG TOTAL) BY MOUTH DAILY. FOR DIABETIC NEUROPATHY (AM) 01/30/23   [name], MD  EASY COMFORT PEN NEEDLES 31G X 5 MM MISC USE FOUR TIMES A DAY FOR INSULIN ADMINISTRATION 08/30/22   McClung, Angela M, PA-C  ELIQUIS 5 MG TABS tablet TAKE 1 TABLET (5 MG TOTAL) BY MOUTH 2 (TWO) TIMES DAILY (AM+BEDTIME) 08/08/22   Laurey Morale, MD  empagliflozin (JARDIANCE) 10 MG TABS tablet Take 1 tablet (10 mg total) by mouth daily. TAKE 1 TABLET before breakfast daily 07/07/22   Anders Simmonds, PA-C   ferrous sulfate (FEROSUL) 325 (65 FE) MG tablet TAKE 1 TABLET (324 MG TOTAL) BY MOUTH 2 (TWO) TIMES DAILY AS NEEDED. (AM+BEDTIME) 01/30/23   [name], MD  folic acid (FOLVITE) 1 MG tablet TAKE 1 TABLET (1 MG TOTAL) BY MOUTH DAILY (AM) 12/29/22   [name], MD  hydrALAZINE (APRESOLINE) 50 MG tablet TAKE 1 & 1/2 TABLETS (75 MG TOTAL) BY MOUTH 3 (THREE) TIMES DAILY (AM+NOON+BEDTIME) 11/03/22   [place], Anderson Malta, FNP  ibuprofen (ADVIL) 600 MG tablet Take 1 tablet by mouth as needed.    [provider]  icosapent Ethyl (VASCEPA) 1 g capsule Take 2 capsules (2 g total) by mouth 2 (two) times daily. 11/04/22   Robbie Lis M, PA-C  insulin lispro (HUMALOG) 100 UNIT/ML KwikPen Inject 10 Units into the skin 3 (three) times daily with meals. 09/13/22   [name], MD  LANTUS SOLOSTAR 100 UNIT/ML Solostar Pen Inject 37 Units into the skin daily. Patient taking differently: Inject 37 Units into the skin at bedtime. 10/10/22   [name], MD  levothyroxine (SYNTHROID) 50 MCG tablet TAKE 1 TABLET (50 MCG TOTAL) BY MOUTH DAILY BEFORE BREAKFAST (AM) 10/11/22   [name], MD  losartan (COZAAR) 25 MG tablet TAKE 2 TABLETS (50 MG TOTAL) BY MOUTH DAILY (AM) 03/01/23   [name], MD  Omega-3 Fatty Acids (FISH OIL PO) Take 1 capsule by mouth every morning.    [provider]  ondansetron (ZOFRAN) 4 MG tablet Take 1 tablet (4 mg total) by mouth every 8 (eight) hours as needed for nausea or vomiting. 08/11/21   [name], MD  pantoprazole (PROTONIX) 40 MG tablet TAKE 1 TABLET (40 MG TOTAL) BY MOUTH DAILY(AM) 03/01/23   [name], MD  potassium chloride SA (KLOR-CON M) 20 MEQ tablet Take 1 tablet (20 mEq total) by mouth 2 (two) times daily. 11/29/22   Laurey Morale, MD  rosuvastatin (CRESTOR) 40 MG tablet Take 1 tablet (40 mg total) by mouth daily. 09/07/22   Cannon Kettle, PA-C  Semaglutide,0.25 or 0.5MG /DOS, (OZEMPIC, 0.25 OR 0.5 MG/DOSE,) 2  MG/1.5ML SOPN Inject 0.5 mg into the skin once a week. 10/10/22   [name], MD  spironolactone (ALDACTONE) 25 MG tablet TAKE 0.5 TABLET (12.5 MG TOTAL) BY MOUTH DAILY (AM) 10/11/22   Laurey Morale, MD  torsemide (DEMADEX) 100 MG tablet TAKE 1 TABLET (100 MG TOTAL) BY MOUTH 2 (TWO) TIMES DAILY. (AM+BEDTIME) 12/29/22   Laurey Morale, MD  triamcinolone 0.1% oint-Cerave equivalent lotion 1:1 mixture Apply topically 2 (two) times daily as needed. 07/07/22   Anders Simmonds, PA-C  tiotropium (SPIRIVA HANDIHALER) 18 MCG inhalation capsule Place 1 capsule (18 mcg total) into inhaler and inhale every morning. Patient not taking: Reported on 02/09/2021 12/09/19 02/09/21  [name], MD      Allergies    Gabapentin and Lyrica [pregabalin]    Review of Systems   Review of Systems  Physical Exam Updated Vital Signs BP (!) 136/56   Pulse 84   Temp 98.2 F (36.8 C)   Resp 18   Ht 5\' 4"  (1.626 m)   Wt 100 kg   LMP  (LMP Unknown)   SpO2 95% Comment: Simultaneous filing. User may not have seen previous data.  BMI 37.84 kg/m  Physical Exam Vitals and nursing note reviewed.  Constitutional:      General: She is not in acute distress.    Appearance: She is well-developed. She is not ill-appearing.  HENT:     Head: Normocephalic and atraumatic.     Nose: Nose normal.     Mouth/Throat:     Mouth: Mucous membranes are moist.  Eyes:     Extraocular Movements: Extraocular movements intact.     Conjunctiva/sclera: Conjunctivae normal.     Pupils: Pupils are equal, round, and reactive to light.  Cardiovascular:     Rate and Rhythm: Normal rate and regular rhythm.     Pulses: Normal pulses.     Heart sounds: Normal heart sounds. No murmur heard.    Comments: She has positive Doppler pulses bilaterally in her DP and PT Pulmonary:     Effort: Pulmonary effort is normal. No respiratory distress.     Breath sounds: Normal breath sounds.  Abdominal:     Palpations: Abdomen is soft.      Tenderness: There is no abdominal tenderness.  Musculoskeletal:        General: Tenderness present. No swelling.     Cervical back: Normal range of motion and neck supple.     Comments: Tenderness to the left posterior hip/paraspinal lumbar muscles on the left  Skin:    General: Skin is warm and dry.     Capillary Refill: Capillary refill takes less than 2 seconds.  Neurological:     General: No focal deficit present.     Mental Status: She is alert.     Sensory: No sensory deficit.     Motor: No weakness.     Comments: 5+ out of 5 strength, symmetric and bilateral decreased sensation in her feet and lower legs baseline per patient  Psychiatric:        Mood and Affect: Mood normal.     ED Results / Procedures / Treatments   Labs (all labs ordered are listed, but only abnormal results are displayed) Labs Reviewed - No data to display  EKG None  Radiology DG HIP UNILAT WITH PELVIS 2-3 VIEWS LEFT  Result Date: 03/20/2023 CLINICAL DATA:  Hip pain EXAM: DG HIP (WITH OR WITHOUT PELVIS) 2-3V LEFT COMPARISON:  None Available. FINDINGS: No acute fracture or dislocation. No aggressive osseous lesion. Normal alignment. Generalized osteopenia. Soft tissue are unremarkable. No radiopaque foreign body or soft tissue emphysema. Peripheral vascular atherosclerotic disease. IMPRESSION: 1. No acute osseous injury of the left hip. Given the patient's age and osteopenia, if there is persistent clinical concern for an occult hip fracture, a MRI of the hip is recommended for increased sensitivity. Electronically Signed   By: Elige Ko M.D.   On: 03/20/2023 08:06    Procedures Procedures    Medications Ordered in ED Medications  oxyCODONE-acetaminophen (PERCOCET/ROXICET) 5-325 MG per tablet 1 tablet (has no administration in time range)  lidocaine (LIDODERM) 5 % 1 patch (has no administration in  time range)  cyclobenzaprine (FLEXERIL) tablet 5 mg (has no administration in time range)    ED  Course/ Medical Decision Making/ A&P                             Medical Decision Making Amount and/or Complexity of Data Reviewed Radiology: ordered.  Risk Prescription drug management.   MIRRIAM KINGSBERRY is here with left hip/left lower back pain.  History of diabetes, CKD, polysubstance abuse, peripheral arterial disease.  Normal vitals.  No fever.  Neurovascular neuromuscular she is intact on exam.  She has Doppler pulses bilaterally in her feet.  She is tender in her left posterior hip and left lower back.  No midline spinal tenderness.  No symptoms to suggest cauda equina.  She denies any trauma.  She is mostly wheelchair-bound at this time.  She has chronic pain.  She has had a history of substance abuse in the past.  Differential diagnosis likely muscle spasm versus sciatic pain.  I have no concern for cauda equina or other spinal cord issue.  No concern for infectious process.  X-ray was obtained and per radiology report with no acute findings.  Patient given lidocaine patch, Flexeril, Norco with improvement.  Will prescribe lidocaine patch and Flexeril.  Will have her follow-up with her primary care doctor.  Discharged in good condition.  This chart was dictated using voice recognition software.  Despite best efforts to proofread,  errors can occur which can change the documentation meaning.         Final Clinical Impression(s) / ED Diagnoses Final diagnoses:  Left hip pain    Rx / DC Orders ED Discharge Orders          Ordered    lidocaine (LIDODERM) 5 %  Every 24 hours        03/20/23 0744    cyclobenzaprine (FLEXERIL) 10 MG tablet  2 times daily PRN        03/20/23 0744              Virgina Norfolk, DO 03/20/23 0809

## 2023-03-22 ENCOUNTER — Ambulatory Visit (HOSPITAL_BASED_OUTPATIENT_CLINIC_OR_DEPARTMENT_OTHER): Payer: 59 | Admitting: Physician Assistant

## 2023-03-22 NOTE — Progress Notes (Signed)
JOLEEN, GRENDA ([id number]) [id number] Page 1 of 1 Visit Report for 03/22/2023 Allergy List Details Patient Name: Date of Service: [name] 03/22/2023 8:00 A M Medical Record Number: [id number] Patient Account Number: [id number] Date of Birth/Sex: Treating RN: 09/19/1961 (63 y.o. [name] Primary Care Roselind Klus: [name] Other Clinician: Referring Elliott Quade: Treating Brooklynn Brandenburg/Extender: [name], [name] Weeks in Treatment: 0 Allergies Active Allergies gabapentin Reaction: Shakes Severity: Moderate Lyrica Allergy Notes Electronic Signature(s) Signed: 03/22/2023 11:31:33 AM By: Shawn Stall RN, BSN Entered By: Shawn Stall on 03/21/2023 12:11:54

## 2023-03-28 ENCOUNTER — Other Ambulatory Visit: Payer: Self-pay | Admitting: Podiatry

## 2023-03-28 DIAGNOSIS — E0840 Diabetes mellitus due to underlying condition with diabetic neuropathy, unspecified: Secondary | ICD-10-CM

## 2023-03-28 DIAGNOSIS — L97512 Non-pressure chronic ulcer of other part of right foot with fat layer exposed: Secondary | ICD-10-CM

## 2023-03-29 ENCOUNTER — Ambulatory Visit: Payer: Self-pay

## 2023-03-29 NOTE — Patient Outreach (Signed)
  Care Coordination   03/29/2023 Name: ARINE LUKS MRN: [id number] DOB: [birth year]   Care Coordination Outreach Attempts:  An unsuccessful telephone outreach was attempted for a scheduled appointment today.  Follow Up Plan:  Additional outreach attempts will be made to offer the patient care coordination information and services.   Encounter Outcome:  No Answer   Care Coordination Interventions:  No, not indicated    Delsa Sale, RN, BSN, CCM Care Management Coordinator [facility] Care Management  Direct Phone: [phone]

## 2023-03-30 ENCOUNTER — Other Ambulatory Visit: Payer: Self-pay | Admitting: Family Medicine

## 2023-03-30 ENCOUNTER — Other Ambulatory Visit (HOSPITAL_COMMUNITY): Payer: Self-pay | Admitting: Family Medicine

## 2023-03-30 ENCOUNTER — Other Ambulatory Visit (HOSPITAL_COMMUNITY): Payer: Self-pay | Admitting: Internal Medicine

## 2023-03-30 ENCOUNTER — Other Ambulatory Visit (HOSPITAL_COMMUNITY): Payer: Self-pay | Admitting: Cardiology

## 2023-03-30 DIAGNOSIS — E1151 Type 2 diabetes mellitus with diabetic peripheral angiopathy without gangrene: Secondary | ICD-10-CM

## 2023-03-30 DIAGNOSIS — I110 Hypertensive heart disease with heart failure: Secondary | ICD-10-CM

## 2023-03-30 NOTE — Telephone Encounter (Signed)
Requested Prescriptions  Pending Prescriptions Disp Refills   DULoxetine (CYMBALTA) 60 MG capsule [Pharmacy Med Name: DULOXETINE HCL 60 MG ORAL CAPSULE DELAYED RELEASE PARTICLES] 30 capsule 0    Sig: TAKE 1 CAPSULE (60 MG TOTAL) BY MOUTH DAILY. FOR DIABETIC NEUROPATHY (AM)     Psychiatry: Antidepressants - SNRI - duloxetine Failed - 03/30/2023  3:07 PM      Failed - Cr in normal range and within 360 days    Creat  Date Value Ref Range Status  01/25/2016 1.09 (H) 0.50 - 1.05 mg/dL Final   Creatinine, Ser  Date Value Ref Range Status  03/02/2023 1.77 (H) 0.44 - 1.00 mg/dL Final   Creatinine, Urine  Date Value Ref Range Status  08/18/2018 72.60 mg/dL Final    Comment:    Performed at [facility], [address] [place], [place] [address]         Passed - eGFR is 30 or above and within 360 days    GFR calc Af Amer  Date Value Ref Range Status  06/08/2020 39 (L) >60 mL/min Final   GFR, Estimated  Date Value Ref Range Status  03/02/2023 33 (L) >60 mL/min Final    Comment:    (NOTE) Calculated using the CKD-EPI Creatinine Equation (2021)    eGFR  Date Value Ref Range Status  09/07/2022 45 (L) >59 mL/min/1.73 Final         Passed - Completed PHQ-2 or PHQ-9 in the last 360 days      Passed - Last BP in normal range    BP Readings from Last 1 Encounters:  03/20/23 (!) 136/56         Passed - Valid encounter within last 6 months    Recent Outpatient Visits           5 months ago Pre-ulcerative calluses   [place] [facility] Tam[place], [place], MD   6 months ago DM (diabetes mellitus), type 2 with peripheral vascular complications [facility])   [place] [facility] [place], [place] L, RPH-CPP   8 months ago DM (diabetes mellitus), type 2 with peripheral vascular complications [facility])   [place] [facility] [place], [place], [place]   1 year ago DM (diabetes mellitus), type 2 with peripheral  vascular complications [facility])   [place] [facility] [place], Odette Horns, MD   1 year ago DM (diabetes mellitus), type 2 with peripheral vascular complications [facility])   [place] [facility] [place], Marzella Schlein, [place]       Future Appointments             In 1 month [name], MD [facility]

## 2023-04-03 ENCOUNTER — Other Ambulatory Visit: Payer: Self-pay | Admitting: Family Medicine

## 2023-04-03 ENCOUNTER — Other Ambulatory Visit: Payer: Self-pay | Admitting: Physician Assistant

## 2023-04-03 DIAGNOSIS — E1151 Type 2 diabetes mellitus with diabetic peripheral angiopathy without gangrene: Secondary | ICD-10-CM

## 2023-04-04 ENCOUNTER — Telehealth: Payer: Self-pay | Admitting: *Deleted

## 2023-04-04 NOTE — Progress Notes (Signed)
  Care Coordination Note  04/04/2023 Name: [name] MRN: [id number] DOB: 1961/11/06  [name] is a 62 y.o. year old female who is a primary care patient of [name], MD and is actively engaged with the care management team. I reached out to [name] by phone today to assist with re-scheduling a follow up visit with the RN Case Manager  Follow up plan: Unsuccessful telephone outreach attempt made. A HIPAA compliant phone message was left for the patient providing contact information and requesting a return call.  [facility]  Care Coordination Care Guide  Direct Dial: [phone]

## 2023-04-05 ENCOUNTER — Encounter (HOSPITAL_BASED_OUTPATIENT_CLINIC_OR_DEPARTMENT_OTHER): Payer: 59 | Admitting: General Surgery

## 2023-04-05 ENCOUNTER — Other Ambulatory Visit (HOSPITAL_COMMUNITY): Payer: Self-pay | Admitting: Internal Medicine

## 2023-04-05 ENCOUNTER — Other Ambulatory Visit: Payer: Self-pay | Admitting: Family Medicine

## 2023-04-05 ENCOUNTER — Other Ambulatory Visit: Payer: Self-pay | Admitting: Pharmacist

## 2023-04-05 ENCOUNTER — Ambulatory Visit: Payer: Self-pay

## 2023-04-05 DIAGNOSIS — E1151 Type 2 diabetes mellitus with diabetic peripheral angiopathy without gangrene: Secondary | ICD-10-CM

## 2023-04-05 DIAGNOSIS — M109 Gout, unspecified: Secondary | ICD-10-CM

## 2023-04-05 MED ORDER — LANTUS SOLOSTAR 100 UNIT/ML ~~LOC~~ SOPN
37.0000 [IU] | PEN_INJECTOR | Freq: Every day | SUBCUTANEOUS | 0 refills | Status: DC
Start: 2023-04-05 — End: 2024-03-17

## 2023-04-05 MED ORDER — INSULIN LISPRO (1 UNIT DIAL) 100 UNIT/ML (KWIKPEN)
10.0000 [IU] | PEN_INJECTOR | Freq: Three times a day (TID) | SUBCUTANEOUS | 0 refills | Status: DC
Start: 2023-04-05 — End: 2024-02-05

## 2023-04-05 NOTE — Telephone Encounter (Signed)
  Chief Complaint: needs refill of lantus and Humalog   Disposition: [] ED /[] Urgent Care (no appt availability in office) / [] Appointment(In office/virtual)/ []  [place] [facility]/ [] Home Care/ [] Refused Recommended Disposition /[] [place] [facility]/ [x]  Follow-up with PCP Additional Notes: pt stopped Ozempic last week due to Stomach issues and nausea and diarrhea- Made appt for June 6 the but pt also is scheduled 05/10/23. Please call pt is 04/20/23 appt not needed.  Reason for Disposition  [1] Prescription refill request for ESSENTIAL medicine (i.e., likelihood of harm to patient if not taken) AND [2] triager unable to refill per department policy  Answer Assessment - Initial Assessment Questions 1. DRUG NAME: "What medicine do you need to have refilled?"     Humalog and Lantus 2. REFILLS REMAINING: "How many refills are remaining?" (Note: The label on the medicine or pill bottle will show how many refills are remaining. If there are no refills remaining, then a renewal may be needed.)     None  3. EXPIRATION DATE: "What is the expiration date?" (Note: The label states when the prescription will expire, and thus can no longer be refilled.)     N/a 4. PRESCRIBING HCP: "Who prescribed it?" Reason: If prescribed by specialist, call should be referred to that group.     PCP 5. SYMPTOMS: "Do you have any symptoms?"     none 6. PREGNANCY: "Is there any chance that you are pregnant?" "When was your last menstrual period?"     N/a  Answer Assessment - Initial Assessment Questions 1. NAME of MEDICINE: "What medicine(s) are you calling about?"     Ozempic  2. QUESTION: "What is your question?" (e.g., double dose of medicine, side effect)     She stopped taking the Ozemipic last week  3. PRESCRIBER: "Who prescribed the medicine?" Reason: if prescribed by specialist, call should be referred to that group.     PCP 4. SYMPTOMS: "Do you have any symptoms?" If Yes, ask: "What symptoms are you  having?"  "How bad are the symptoms (e.g., mild, moderate, severe)     Sour stomach, vomiting 5. PREGNANCY:  "Is there any chance that you are pregnant?" "When was your last menstrual period?"     N/a  Protocols used: Medication Refill and Renewal Call-A-AH, Medication Question Call-A-AH

## 2023-04-12 ENCOUNTER — Encounter: Payer: Self-pay | Admitting: Licensed Clinical Social Worker

## 2023-04-12 NOTE — Progress Notes (Unsigned)
  Care Coordination Note  04/12/2023 Name: [name] MRN: [id number] DOB: April 15, 1961  [name] is a 62 y.o. year old female who is a primary care patient of [name], MD and is actively engaged with the care management team. I reached out to [name] by phone today to assist with re-scheduling a follow up visit with the RN Case Manager  Follow up plan: Unsuccessful telephone outreach attempt made. A HIPAA compliant phone message was left for the patient providing contact information and requesting a return call.   [facility]  Care Coordination Care Guide  Direct Dial: [phone]

## 2023-04-13 ENCOUNTER — Other Ambulatory Visit: Payer: Self-pay | Admitting: Family Medicine

## 2023-04-13 ENCOUNTER — Other Ambulatory Visit: Payer: Self-pay | Admitting: Physician Assistant

## 2023-04-13 ENCOUNTER — Telehealth: Payer: Self-pay | Admitting: Licensed Clinical Social Worker

## 2023-04-13 DIAGNOSIS — M109 Gout, unspecified: Secondary | ICD-10-CM

## 2023-04-13 NOTE — Progress Notes (Signed)
  Care Coordination Note  04/13/2023 Name: [name] MRN: [id number] DOB: 1961-08-04  [name] is a 62 y.o. year old female who is a primary care patient of [name], MD and is actively engaged with the care management team. I reached out to [name] by phone today to assist with re-scheduling a follow up visit with the RN Case Manager and Licensed Clinical Social Worker  Follow up plan: Unsuccessful telephone outreach attempt made. A HIPAA compliant phone message was left for the patient providing contact information and requesting a return call.  We have been unable to make contact with the patient for follow up. The care management team is available to follow up with the patient after provider conversation with the patient regarding recommendation for care management engagement and subsequent re-referral to the care management team.   [facility] Coordination Care Guide  Direct Dial: [phone]

## 2023-04-13 NOTE — Telephone Encounter (Signed)
Pt's eGFR is 33. With her kidney function, would you consider decreasing to 50 mg daily?

## 2023-04-13 NOTE — Patient Outreach (Signed)
  Care Coordination   04/13/2023 Name: [name] MRN: [id number] DOB: [birth year]   Care Coordination Outreach Attempts:  An unsuccessful telephone outreach was attempted for a scheduled appointment today.  Follow Up Plan:  Additional outreach attempts will be made to offer the patient care coordination information and services.   Encounter Outcome:  No Answer   Care Coordination Interventions:  No, not indicated    [name], MSW, LCSW [facility] Care Management [place]  [facility] Liborio Negrin [email or web address] Phone [phone] 8:40 AM

## 2023-04-14 ENCOUNTER — Encounter (HOSPITAL_COMMUNITY): Payer: Self-pay

## 2023-04-14 ENCOUNTER — Ambulatory Visit (HOSPITAL_COMMUNITY)
Admission: EM | Admit: 2023-04-14 | Discharge: 2023-04-14 | Disposition: A | Discharge status: Home / Self Care | Payer: 59 | Attending: Emergency Medicine | Admitting: Emergency Medicine

## 2023-04-14 DIAGNOSIS — Z794 Long term (current) use of insulin: Secondary | ICD-10-CM

## 2023-04-14 DIAGNOSIS — L97429 Non-pressure chronic ulcer of left heel and midfoot with unspecified severity: Secondary | ICD-10-CM

## 2023-04-14 DIAGNOSIS — L97512 Non-pressure chronic ulcer of other part of right foot with fat layer exposed: Secondary | ICD-10-CM | POA: Diagnosis not present

## 2023-04-14 DIAGNOSIS — E11621 Type 2 diabetes mellitus with foot ulcer: Secondary | ICD-10-CM

## 2023-04-14 DIAGNOSIS — K529 Noninfective gastroenteritis and colitis, unspecified: Secondary | ICD-10-CM | POA: Diagnosis not present

## 2023-04-14 DIAGNOSIS — I872 Venous insufficiency (chronic) (peripheral): Secondary | ICD-10-CM | POA: Diagnosis not present

## 2023-04-14 DIAGNOSIS — E119 Type 2 diabetes mellitus without complications: Secondary | ICD-10-CM

## 2023-04-14 LAB — POCT FASTING CBG KUC MANUAL ENTRY: POCT Glucose (KUC): 173 mg/dL — AB (ref 70–99)

## 2023-04-14 MED ORDER — ONDANSETRON HCL 4 MG/2ML IJ SOLN
4.0000 mg | Freq: Once | INTRAMUSCULAR | Status: AC
  Administered 2023-04-14: 4 mg via INTRAMUSCULAR

## 2023-04-14 MED ORDER — ONDANSETRON HCL 4 MG/2ML IJ SOLN
INTRAMUSCULAR | Status: AC
  Filled 2023-04-14: qty 2

## 2023-04-14 MED ORDER — ONDANSETRON 8 MG PO TBDP: 8.0000 mg | ORAL_TABLET | Freq: Three times a day (TID) | ORAL | 0 refills | Status: DC | PRN

## 2023-04-14 NOTE — ED Triage Notes (Signed)
Patient has a callous on the lateral left side of the foot. Blister on both legs, and a wound on the right foot as well. Patient is diabetic. Onset of wounds one week ago.   Emesis onset today. No known sick exposure or anyone with similar symptoms. No new meds, recently stopped Ozempic. Patient having constipation and diarrhea alternating.

## 2023-04-14 NOTE — ED Provider Notes (Signed)
[facility]    CSN: [id number] Arrival date & time: 04/14/23  1055      History   Chief Complaint Chief Complaint  Patient presents with   Blister   Emesis    HPI [name] is a 62 y.o. female.   Presents to clinic for concerns of wounds on her left foot.  Has a known plantar wound on her right foot.  She is diabetic and is post- amputation to all of the toes to her right foot.  She also complains of nausea, dry heaving, emesis and diarrhea since this morning. Every time she dry heaves she has diarrhea.  She denies fevers.  Complains of blisters to her bilateral lower legs.  Was prescribed Kenalog cream for this yesterday.    The history is provided by the patient and medical records.  Emesis Associated symptoms: diarrhea   Associated symptoms: no abdominal pain, no chills, no cough and no fever     Past Medical History:  Diagnosis Date   Alcohol abuse    Alcoholic cirrhosis ([name])    Anemia    Anxiety    Arthritis    "knees" (11/26/2018)   B12 deficiency    CAD (coronary artery disease)    a. 11/10/2014 Cath: LM nl, LAD min irregs, D1 30 ost, D2 50d, LCX 59m, OM1 80 p/m (1.5 mm vessel), OM2 19m, RCA nondom 49m-->med rx.. Demand ischemia in the setting of rapid a-fib.   Cardiomyopathy ([name])    Carotid artery disease ([name])    a. 01/2015 Carotid Angio: RICA 100, LICA 95p; b. 02/2015 s/p L CEA; c. 05/2019 Carotid U/S: RICA 100. RECA >50. LICA 1-39%.   Cellulitis    lower extremities   CHF (congestive heart failure) ([name])    Chronic combined systolic and diastolic CHF (congestive heart failure) ([name])    a. 10/2014 Echo: EF 40-45%; b. 10/2018 Echo: EF 45-50%, gr2 DD; c. 11/2019 Echo: EF 50%, mild LVH, gr2 DD (restrictive), antlat HK, Nl RV fxn. Mild BAE. RVSP 59.7mmHg.   CKD (chronic kidney disease), stage III ([name])    Cocaine abuse ([name])    COPD (chronic obstructive pulmonary disease) ([name])    Critical lower limb ischemia ([name])    Depression    Diabetes  mellitus without complication ([name])    Diabetic peripheral neuropathy ([name])    DVT (deep venous thrombosis) ([name])    Dyspnea    Elevated troponin    a. Chronic elevation.   GERD (gastroesophageal reflux disease)    Hyperlipemia    Hypertension    Hypokalemia    Hypomagnesemia    Hypothyroidism    Marijuana abuse    Narcotic abuse ([name])    Noncompliance    NSVT (nonsustained ventricular tachycardia) ([name])    Obesity    PAF (paroxysmal atrial fibrillation) ([name])    Paroxysmal atrial tachycardia    Peripheral arterial disease ([name])    a. 01/2015 Angio/PTA: RSFA 99 (atherectomy/pta) - 1 vessel runoff via diff dzs peroneal; b. 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   Pneumonia    "once or twice" (11/26/2018)   Poorly controlled type 2 diabetes mellitus ([name])    Renal disorder    Renal insufficiency    a. 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   Sleep apnea    "couldn't handle wearing the mask" (11/26/2018)   Symptomatic bradycardia    a. Avoid AV blocking agent per EP. Prev req temp wire in 2017.  Tobacco abuse     Patient Active Problem List   Diagnosis Date Noted   Pre-ulcerative calluses 04/25/2022   Viral gastroenteritis 04/18/2022   Hyperlipidemia associated with type 2 diabetes mellitus ([name]) 07/29/2021   Uncontrolled type 2 diabetes mellitus with hyperglycemia ([name]) 06/13/2021   Acute kidney injury superimposed on CKD ([name]) 06/13/2021   Critical ischemia of foot ([name]) 06/07/2021   Fall 05/05/2021   PAF (paroxysmal atrial fibrillation) ([name]) 05/05/2021   COPD (chronic obstructive pulmonary disease) ([name]) 05/05/2021   DM (diabetes mellitus), type 2 with complications ([name]) 05/05/2021   PAD (peripheral artery disease) ([name]) 05/05/2021   Cellulitis 05/05/2021   Acute on chronic combined systolic and diastolic CHF (congestive heart failure) ([name]) 05/05/2021   OSA (obstructive sleep apnea) 05/05/2021   Stage 3b chronic kidney disease ([name]) 05/05/2021   AKI (acute  kidney injury) ([name]) 05/04/2021   Pressure injury of skin 05/04/2021   Ulcer of foot, unspecified laterality, limited to breakdown of skin ([name]) 02/17/2021   AF (paroxysmal atrial fibrillation) ([name]) 01/02/2021   CAD (coronary artery disease) 01/02/2021   PVD (peripheral vascular disease) ([name]) 01/02/2021   Diabetes mellitus type 2, uncontrolled, with complications 01/02/2021   Diabetic nephropathy ([name]) 01/02/2021   Low back pain 06/01/2020   Hyperlipidemia 04/03/2020   Muscle weakness 03/20/2020   Pain in elbow 03/12/2020   PAD (peripheral artery disease) ([name]) 12/26/2019   Acute renal failure ([name])    Acute respiratory failure with hypoxia ([name]) 12/02/2019   Cellulitis in diabetic foot ([name]) 07/08/2019   Osteomyelitis ([name]) 06/21/2019   NICM (nonischemic cardiomyopathy) ([name]) 06/20/2019   Non-healing ulcer ([name]) 06/20/2019   CHF (congestive heart failure) ([name]) 11/26/2018   Coagulation disorder ([name]) 08/09/2017   Depression 07/21/2017   At risk for adverse drug reaction 06/20/2017   Peripheral neuropathy 06/20/2017   S/P transmetatarsal amputation of foot, right ([name]) 06/05/2017   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) 05/19/2017   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[name])    SIRS (systemic inflammatory response syndrome) ([name]) 04/06/2017   Suspected sleep apnea 11/24/2016   Ulcer of toe of right foot, with necrosis of bone ([name]) 10/27/2016   Ulcer of left lower leg ([name]) 05/19/2016   Severe obesity (BMI >= 40) ([name]) 02/24/2016   Morbid obesity ([name]) 02/24/2016   Encounter for therapeutic drug monitoring 02/10/2016   Symptomatic bradycardia 01/12/2016   Hypertension associated with diabetes ([name]) 12/22/2015   Chronic combined systolic and diastolic CHF (congestive heart failure) ([name])    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 11/08/2015   Hypokalemia 11/08/2015   Tobacco abuse 10/23/2015   Coronary artery disease    DOE (dyspnea on  exertion) 04/29/2015   Diabetes mellitus type 2, uncontrolled 02/08/2015   Influenza A 02/07/2015   Wrist fracture, left, closed, initial encounter 01/29/2015   PAF (paroxysmal atrial fibrillation) ([name]) 01/16/2015   Carotid arterial disease ([name]) 01/16/2015   Claudication ([name]) 01/15/2015   Demand ischemia 10/29/2014   Insomnia 02/03/2014   S/P peripheral artery angioplasty - [place] atherectomy; R SFA 09/11/2013    Class: Acute   Leg pain, bilateral 08/19/2013   Hypothyroidism 07/31/2013   Cellulitis 06/13/2013   History of cocaine abuse ([name]) 06/13/2013   Long term current use of anticoagulant therapy 05/20/2013   Alcohol abuse    Narcotic abuse ([name])    Marijuana abuse    Alcoholic cirrhosis ([name])    DM (diabetes mellitus), type 2 with peripheral vascular complications ([name])  Past Surgical History:  Procedure Laterality Date   ABDOMINAL AORTOGRAM N/A 06/26/2019   Procedure: ABDOMINAL AORTOGRAM;  Surgeon: Iran Ouch, MD;  [facility];  Service: Cardiovascular;  Laterality: N/A;   ABDOMINAL AORTOGRAM W/LOWER EXTREMITY N/A 06/07/2021   Procedure: ABDOMINAL AORTOGRAM W/LOWER EXTREMITY;  Surgeon: Runell Gess, MD;  [facility];  Service: Cardiovascular;  Laterality: N/A;   AMPUTATION Right 06/14/2017   Procedure: Right foot transmetatarsal amputation;  Surgeon: Nadara Mustard, MD;  Location[facility] OR;  Service: Orthopedics;  Laterality: Right;   AMPUTATION Left 06/28/2019   Procedure: AMPUTATION LEFT FIFTH TOE;  Surgeon: Larina Earthly, MD;  Location: [facility] OR;  Service: Vascular;  Laterality: Left;   AMPUTATION TOE Right 04/28/2017   Procedure: AMPUTATION OF RIGHT SECOND RAY;  Surgeon: Nadara Mustard, MD;  Location: [facility] OR;  Service: Orthopedics;  Laterality: Right;   CARDIAC CATHETERIZATION     CARDIAC CATHETERIZATION N/A 01/12/2016   Procedure: Temporary Wire;  Surgeon: Rollene Rotunda, MD;  [facility];  Service:  Cardiovascular;  Laterality: N/A;   CARDIOVERSION  ~ 02/2013   "twice"    CAROTID ANGIOGRAM N/A 01/15/2015   Procedure: CAROTID ANGIOGRAM;  Surgeon: Runell Gess, MD;  Location: [facility] CATH LAB;  Service: Cardiovascular;  Laterality: N/A;   DILATION AND CURETTAGE OF UTERUS  1988   ENDARTERECTOMY Left 02/19/2015   Procedure: LEFT CAROTID ENDARTERECTOMY ;  Surgeon: Nada Libman, MD;  Location: [facility] OR;  Service: Vascular;  Laterality: Left;   FEMORAL-TIBIAL BYPASS GRAFT Left 06/28/2019   Procedure: BYPASS GRAFT LEFT LEG FEMORAL TO ANTERIOR TIBIAL ARTERY using LEFT GREATER SAPHENOUS VEIN;  Surgeon: Larina Earthly, MD;  [facility];  Service: Vascular;  Laterality: Left;   LEFT HEART CATHETERIZATION WITH CORONARY ANGIOGRAM N/A 10/31/2014   Procedure: LEFT HEART CATHETERIZATION WITH CORONARY ANGIOGRAM;  Surgeon: Kathleene Hazel, MD;  Location: [facility] CATH LAB;  Service: Cardiovascular;  Laterality: N/A;   LOWER EXTREMITY ANGIOGRAM N/A 09/10/2013   Procedure: LOWER EXTREMITY ANGIOGRAM;  Surgeon: Runell Gess, MD;  Location: [facility] CATH LAB;  Service: Cardiovascular;  Laterality: N/A;   LOWER EXTREMITY ANGIOGRAM N/A 01/15/2015   Procedure: LOWER EXTREMITY ANGIOGRAM;  Surgeon: Runell Gess, MD;  Location: [facility] CATH LAB;  Service: Cardiovascular;  Laterality: N/A;   LOWER EXTREMITY ANGIOGRAPHY N/A 04/13/2017   Procedure: Lower Extremity Angiography;  Surgeon: Runell Gess, MD;  Location: [facility] INVASIVE CV LAB;  Service: Cardiovascular;  Laterality: N/A;   LOWER EXTREMITY ANGIOGRAPHY Left 06/26/2019   Procedure: LOWER EXTREMITY ANGIOGRAPHY;  Surgeon: Iran Ouch, MD;  [facility];  Service: Cardiovascular;  Laterality: Left;   PERIPHERAL VASCULAR ATHERECTOMY Right 06/07/2021   Procedure: PERIPHERAL VASCULAR ATHERECTOMY;  Surgeon: Runell Gess, MD;  Location: [facility] INVASIVE CV LAB;  Service: Cardiovascular;  Laterality: Right;   PERIPHERAL VASCULAR BALLOON ANGIOPLASTY Left  06/26/2019   Procedure: PERIPHERAL VASCULAR BALLOON ANGIOPLASTY;  Surgeon: Iran Ouch, MD;  [facility];  Service: Cardiovascular;  Laterality: Left;  unable to cross lt sfa occlusion   PERIPHERAL VASCULAR INTERVENTION  04/13/2017   Procedure: Peripheral Vascular Intervention;  Surgeon: Runell Gess, MD;  Location: [facility] INVASIVE CV LAB;  Service: Cardiovascular;;   PRESSURE SENSOR/CARDIOMEMS N/A 02/05/2020   Procedure: PRESSURE SENSOR/CARDIOMEMS;  Surgeon: Laurey Morale, MD;  Location: [facility] INVASIVE CV LAB;  Service: Cardiovascular;  Laterality: N/A;   VEIN HARVEST Left 06/28/2019  Procedure: LEFT LEG GREATER SAPHENOUS VEIN HARVEST;  Surgeon: Larina Earthly, MD;  [facility];  Service: Vascular;  Laterality: Left;    OB History     Gravida  1   Para  1   Term  1   Preterm  0   AB  0   Living         SAB  0   IAB  0   Ectopic  0   Multiple      Live Births               Home Medications    Prior to Admission medications   Medication Sig Start Date End Date Taking? Authorizing Provider  ACCU-CHEK GUIDE test strip USE TO CHECK BLOOD SUGAR FOUR TIMES DAILY 02/25/20  Yes [provider]  acetaminophen (TYLENOL) 500 MG tablet Take 1,000 mg by mouth every 6 (six) hours as needed for headache (pain).   Yes [provider]  albuterol (VENTOLIN HFA) 108 (90 Base) MCG/ACT inhaler USE 2 PUFFS BY MOUTH EVERY FOUR HOURS, AS NEEDED, FOR COUGHING/WHEEZING 07/12/22  Yes Newlin, [name], MD  amiodarone (PACERONE) 200 MG tablet TAKE 1/2 TABLET (100 MG TOTAL) BY MOUTH DAILY (AM) 03/30/23  Yes Laurey Morale, MD  amLODipine (NORVASC) 10 MG tablet TAKE 1 TABLET (10 MG TOTAL) BY MOUTH EVERY MORNING. 03/01/23  Yes [name], MD  Ascorbic Acid (VITAMIN C PO) Take 1 tablet by mouth every morning.   Yes [provider]  Aspirin-Salicylamide-Caffeine (BC HEADACHE POWDER PO) Take 1 packet by mouth 4 (four) times daily as needed  (pain/headache).   Yes [provider]  budesonide-formoterol (SYMBICORT) 160-4.5 MCG/ACT inhaler Inhale 2 puffs into the lungs 2 (two) times daily. 07/07/22  Yes McClung, Marzella Schlein, PA-C  buPROPion (WELLBUTRIN SR) 150 MG 12 hr tablet TAKE 1 TABLET (150 MG TOTAL) BY MOUTH 2 (TWO) TIMES DAILY. (AM+ BEDTIME) 03/01/23  Yes Laurey Morale, MD  Cholecalciferol (VITAMIN D3 PO) Take 1 capsule by mouth every morning.   Yes [provider]  colchicine 0.6 MG tablet TAKE 2 TABS (1.2 MG) AT THE ONSET OF A GOUT FLARE, MAY TAKE 1 TAB (0.6 MG) IN 1 HOUR IF SYMPTOMS 01/11/23  Yes Newlin, [name], MD  Continuous Blood Gluc Sensor (FREESTYLE LIBRE 2 SENSOR) MISC USE 1 (ONE) EACH EVERY 2 WEEKS 07/14/21  Yes [name], MD  cyclobenzaprine (FLEXERIL) 10 MG tablet Take 1 tablet (10 mg total) by mouth 2 (two) times daily as needed for muscle spasms. 03/20/23  Yes Curatolo, Adam, DO  DULoxetine (CYMBALTA) 60 MG capsule TAKE 1 CAPSULE (60 MG TOTAL) BY MOUTH DAILY. FOR DIABETIC NEUROPATHY (AM) 03/30/23  Yes [name], MD  EASY COMFORT PEN NEEDLES 31G X 5 MM MISC USE FOUR TIMES A DAY FOR INSULIN ADMINISTRATION 08/30/22  Yes McClung, Angela M, PA-C  ELIQUIS 5 MG TABS tablet TAKE 1 TABLET (5 MG TOTAL) BY MOUTH 2 (TWO) TIMES DAILY (AM+BEDTIME) 08/08/22  Yes Laurey Morale, MD  FEROSUL 325 (65 Fe) MG tablet TAKE 1 TABLET (324 MG TOTAL) BY MOUTH 2 (TWO) TIMES DAILY AS NEEDED. (AM+BEDTIME) 04/05/23  Yes [name], MD  folic acid (FOLVITE) 1 MG tablet TAKE 1 TABLET (1 MG TOTAL) BY MOUTH DAILY (AM) 12/29/22  Yes Newlin, [name], MD  hydrALAZINE (APRESOLINE) 50 MG tablet TAKE 1 & 1/2 TABLETS (75 MG TOTAL) BY MOUTH 3 (THREE) TIMES DAILY (AM+NOON+BEDTIME) 03/30/23  Yes Laurey Morale, MD  ibuprofen (ADVIL) 600  MG tablet Take 1 tablet by mouth as needed.   Yes [provider]  icosapent Ethyl (VASCEPA) 1 g capsule Take 2 capsules (2 g total) by mouth 2 (two) times daily. 11/04/22  Yes Sharol Harness,  Brittainy M, PA-C  insulin lispro (HUMALOG) 100 UNIT/ML KwikPen Inject 10 Units into the skin 3 (three) times daily with meals. 04/05/23  Yes Newlin, [name], MD  JARDIANCE 10 MG TABS tablet TAKE 1 TABLET (10 MG TOTAL) BY MOUTH DAILY BEFORE BREAKFAST (AM) 03/30/23  Yes [place], [place], FNP  LANTUS SOLOSTAR 100 UNIT/ML Solostar Pen Inject 37 Units into the skin daily. 04/05/23  Yes [name], MD  levothyroxine (SYNTHROID) 50 MCG tablet TAKE 1 TABLET (50 MCG TOTAL) BY MOUTH DAILY BEFORE BREAKFAST (AM) 10/11/22  Yes Newlin, [name], MD  lidocaine (LIDODERM) 5 % Place 1 patch onto the skin daily. Remove & Discard patch within 12 hours or as directed by MD 03/20/23  Yes Curatolo, Adam, DO  losartan (COZAAR) 25 MG tablet TAKE 2 TABLETS (50 MG TOTAL) BY MOUTH DAILY (AM) 03/01/23  Yes Newlin, [name], MD  Omega-3 Fatty Acids (FISH OIL PO) Take 1 capsule by mouth every morning.   Yes [provider]  ondansetron (ZOFRAN) 4 MG tablet Take 1 tablet (4 mg total) by mouth every 8 (eight) hours as needed for nausea or vomiting. 08/11/21  Yes [name], MD  ondansetron (ZOFRAN-ODT) 8 MG disintegrating tablet Take 1 tablet (8 mg total) by mouth every 8 (eight) hours as needed for nausea or vomiting. 04/14/23  Yes Rinaldo Ratel, [place] N, FNP  pantoprazole (PROTONIX) 40 MG tablet TAKE 1 TABLET (40 MG TOTAL) BY MOUTH DAILY(AM) 03/01/23  Yes Newlin, [name], MD  potassium chloride SA (KLOR-CON M) 20 MEQ tablet TAKE 1 TABLET (20 MEQ TOTAL) BY MOUTH 2 (TWO) TIMES DAILY. (AM+EVENING) 03/30/23  Yes Laurey Morale, MD  rosuvastatin (CRESTOR) 40 MG tablet Take 1 tablet (40 mg total) by mouth daily. 09/07/22  Yes Juanda Crumble K, PA-C  Semaglutide,0.25 or 0.5MG /DOS, (OZEMPIC, 0.25 OR 0.5 MG/DOSE,) 2 MG/1.5ML SOPN Inject 0.5 mg into the skin once a week. 10/10/22  Yes [name], MD  spironolactone (ALDACTONE) 25 MG tablet TAKE 0.5 TABLET (12.5 MG TOTAL) BY MOUTH DAILY (AM) 10/11/22  Yes Laurey Morale, MD   torsemide (DEMADEX) 100 MG tablet TAKE 1 TABLET (100 MG TOTAL) BY MOUTH 2 (TWO) TIMES DAILY. (AM+BEDTIME) 12/29/22  Yes Laurey Morale, MD  triamcinolone ointment (KENALOG) 0.1 % MIX 1:1 WITH CERA-VE (OTC) AND APPLY TOPICALLY 2 (TWO) TIMES DAILY AS NEEDED. 04/13/23  Yes [name], MD  allopurinol (ZYLOPRIM) 100 MG tablet Take 0.5 tablets (50 mg total) by mouth daily. TAKE 1 TABLET (100 MG TOTAL) BY MOUTH DAILY(AM) 04/14/23   [name], MD  doxycycline (VIBRAMYCIN) 100 MG capsule Take 1 capsule (100 mg total) by mouth 2 (two) times daily. Patient not taking: Reported on 02/09/2023 01/24/23   Gilda Crease, MD  tiotropium (SPIRIVA HANDIHALER) 18 MCG inhalation capsule Place 1 capsule (18 mcg total) into inhaler and inhale every morning. Patient not taking: Reported on 02/09/2021 12/09/19 02/09/21  [name], MD    Family History Family History  Problem Relation Age of Onset   Hypertension Mother    Diabetes Mother    Cancer Mother        breast, ovarian, colon   Clotting disorder Mother    Heart disease Mother    Heart attack Mother    Breast cancer Mother  in 50's   Hypertension Father    Heart disease Father    Emphysema Sister        smoked    Social History Social History   Tobacco Use   Smoking status: Former    Packs/day: 1.00    Years: 44.00    Additional pack years: 0.00    Total pack years: 44.00    Types: E-cigarettes, Cigarettes   Smokeless tobacco: Former    Types: Snuff  Vaping Use   Vaping Use: Former   Devices: 11/26/2018 "stopped months ago"  Substance Use Topics   Alcohol use: Not Currently    Comment: occ   Drug use: Not Currently    Types: "Crack" cocaine, Marijuana, Oxycodone     Allergies   Gabapentin and Lyrica [pregabalin]   Review of Systems Review of Systems  Constitutional:  Negative for chills and fever.  Respiratory:  Negative for cough.   Gastrointestinal:  Positive for diarrhea and vomiting. Negative  for abdominal pain.  Skin:  Positive for rash and wound.     Physical Exam Triage Vital Signs ED Triage Vitals  Enc Vitals Group     BP 04/14/23 1154 137/61     Pulse Rate 04/14/23 1154 85     Resp 04/14/23 1154 20     Temp 04/14/23 1154 98 F (36.7 C)     Temp Source 04/14/23 1154 Oral     SpO2 04/14/23 1154 95 %     Weight --      Height --      Head Circumference --      Peak Flow --      Pain Score 04/14/23 1153 10     Pain Loc --      Pain Edu? --      Excl. in GC? --    No data found.  Updated Vital Signs BP 137/61 (BP Location: Left Arm)   Pulse 85   Temp 98 F (36.7 C) (Oral)   Resp 20   LMP  (LMP Unknown)   SpO2 95%   Visual Acuity Right Eye Distance:   Left Eye Distance:   Bilateral Distance:    Right Eye Near:   Left Eye Near:    Bilateral Near:     Physical Exam Vitals and nursing note reviewed.  Constitutional:      Appearance: Normal appearance.  HENT:     Head: Normocephalic and atraumatic.     Right Ear: External ear normal.     Left Ear: External ear normal.     Nose: Nose normal.     Mouth/Throat:     Mouth: Mucous membranes are moist.  Eyes:     Conjunctiva/sclera: Conjunctivae normal.  Cardiovascular:     Rate and Rhythm: Normal rate.  Pulmonary:     Effort: Pulmonary effort is normal. No respiratory distress.  Abdominal:     General: Abdomen is flat. Bowel sounds are normal. There is no distension.     Palpations: Abdomen is soft. There is no mass.     Tenderness: There is no abdominal tenderness. There is no guarding or rebound.     Hernia: No hernia is present.  Musculoskeletal:        General: Normal range of motion.     Right lower leg: 2+ Pitting Edema present.     Left lower leg: 2+ Pitting Edema present.  Skin:    General: Skin is warm and dry.     Capillary Refill: Capillary  refill takes less than 2 seconds.     Findings: Erythema and rash present.  Neurological:     General: No focal deficit present.     Mental  Status: She is alert and oriented to person, place, and time.  Psychiatric:        Mood and Affect: Mood normal.        Behavior: Behavior normal. Behavior is cooperative.      UC Treatments / Results  Labs (all labs ordered are listed, but only abnormal results are displayed) Labs Reviewed  POCT FASTING CBG [facility] MANUAL ENTRY - Abnormal; Notable for the following components:      Result Value   POCT Glucose ([facility]) 173 (*)    All other components within normal limits    EKG   Radiology No results found.  Procedures Procedures (including critical care time)  Medications Ordered in UC Medications  ondansetron (ZOFRAN) injection 4 mg (4 mg Intramuscular Given 04/14/23 1215)    Initial Impression / Assessment and Plan / UC Course  I have reviewed the triage vital signs and the nursing notes.  Pertinent labs & imaging results that were available during my care of the patient were reviewed by me and considered in my medical decision making (see chart for details).  Vitals in triage reviewed, patient is hemodynamically stable.  Bilateral venous stasis, was recently prescribed Kenalog cream yesterday, encouraged to fill this.  Wounds to her lower feet are stable without active signs of infection such as warmth, purulent drainage, patient is afebrile without tachycardia, low concerns for sepsis or systemic infection.  CBG 177 in clinic.     Patient with emesis, dry heaves and diarrhea in clinic, abdomen is soft and nontender, no need for emergent imaging at this time.  Patient reports she is tolerating water.  Emesis is improved after IM Zofran administration. Encouraged bland diet and emergency room follow-up if symptoms progress.  Encourage podiatry and wound care clinic follow-up.  Patient verbalized understanding, no questions at this time.     Final Clinical Impressions(s) / UC Diagnoses   Final diagnoses:  Type 2 diabetes mellitus without complication, with long-term current  use of insulin ([name])  Diabetic ulcer of left midfoot associated with type 2 diabetes mellitus, unspecified ulcer stage ([name])  Diabetic ulcer of other part of right foot associated with type 2 diabetes mellitus, with fat layer exposed ([name])  Stasis dermatitis of both legs  Gastroenteritis     Discharge Instructions      Please pick up the triamcinolone cream that you are prescribed yesterday.  This will help with your stasis dermatitis of both of your legs.  You appear to have a viral illness, you can take the Zofran every 8 hours as needed.  For the diarrhea I suggest a bland diet and drinking at least 64 ounces of water or an electrolyte containing solution like Pedialyte.   Please follow-up with podiatry and the wound care clinic.  If you do have syncope, loss of consciousness, or unable to hold down food or fluid, or any new concerning symptoms, please go to the emergency department for further evaluation.      ED Prescriptions     Medication Sig Dispense Auth. Provider   ondansetron (ZOFRAN-ODT) 8 MG disintegrating tablet Take 1 tablet (8 mg total) by mouth every 8 (eight) hours as needed for nausea or vomiting. 20 tablet Lonya Johannesen, [place] N, [place]      PDMP not reviewed this encounter.   Rinaldo Ratel, [place]  N, FNP 04/14/23 1239

## 2023-04-14 NOTE — Telephone Encounter (Signed)
Pharmacy is calling to confirm the correct dosage for pt. Please advise.

## 2023-04-14 NOTE — Discharge Instructions (Addendum)
Please pick up the triamcinolone cream that you are prescribed yesterday.  This will help with your stasis dermatitis of both of your legs.  You appear to have a viral illness, you can take the Zofran every 8 hours as needed.  For the diarrhea I suggest a bland diet and drinking at least 64 ounces of water or an electrolyte containing solution like Pedialyte.   Please follow-up with podiatry and the wound care clinic.  If you do have syncope, loss of consciousness, or unable to hold down food or fluid, or any new concerning symptoms, please go to the emergency department for further evaluation.

## 2023-04-18 ENCOUNTER — Telehealth: Payer: Self-pay | Admitting: *Deleted

## 2023-04-18 DIAGNOSIS — E1165 Type 2 diabetes mellitus with hyperglycemia: Secondary | ICD-10-CM | POA: Diagnosis not present

## 2023-04-18 NOTE — Progress Notes (Signed)
  Care Coordination Note  04/18/2023 Name: [name] MRN: [id number] DOB: 1961-04-14  [name] is a 62 y.o. year old female who is a primary care patient of [name], MD and is actively engaged with the care management team.  [name] made an inbound call by phone today to assist with re-scheduling a follow up visit with the RN Case Manager and Licensed Clinical Social Worker  Follow up plan: Telephone appointment with care management team member scheduled for:SW 6/7 and [facility] 6/11  [facility] Coordination Care Guide  Direct Dial: [phone]

## 2023-04-19 ENCOUNTER — Encounter (HOSPITAL_BASED_OUTPATIENT_CLINIC_OR_DEPARTMENT_OTHER): Payer: 59 | Attending: Physician Assistant | Admitting: Physician Assistant

## 2023-04-19 DIAGNOSIS — E11622 Type 2 diabetes mellitus with other skin ulcer: Secondary | ICD-10-CM | POA: Insufficient documentation

## 2023-04-19 DIAGNOSIS — N1832 Chronic kidney disease, stage 3b: Secondary | ICD-10-CM | POA: Diagnosis not present

## 2023-04-19 DIAGNOSIS — L97812 Non-pressure chronic ulcer of other part of right lower leg with fat layer exposed: Secondary | ICD-10-CM | POA: Insufficient documentation

## 2023-04-19 DIAGNOSIS — L97522 Non-pressure chronic ulcer of other part of left foot with fat layer exposed: Secondary | ICD-10-CM | POA: Insufficient documentation

## 2023-04-19 DIAGNOSIS — E11621 Type 2 diabetes mellitus with foot ulcer: Secondary | ICD-10-CM | POA: Diagnosis not present

## 2023-04-19 DIAGNOSIS — I130 Hypertensive heart and chronic kidney disease with heart failure and stage 1 through stage 4 chronic kidney disease, or unspecified chronic kidney disease: Secondary | ICD-10-CM | POA: Insufficient documentation

## 2023-04-19 DIAGNOSIS — L97512 Non-pressure chronic ulcer of other part of right foot with fat layer exposed: Secondary | ICD-10-CM | POA: Insufficient documentation

## 2023-04-19 DIAGNOSIS — E1122 Type 2 diabetes mellitus with diabetic chronic kidney disease: Secondary | ICD-10-CM | POA: Diagnosis not present

## 2023-04-19 DIAGNOSIS — I87331 Chronic venous hypertension (idiopathic) with ulcer and inflammation of right lower extremity: Secondary | ICD-10-CM | POA: Diagnosis not present

## 2023-04-19 DIAGNOSIS — E1151 Type 2 diabetes mellitus with diabetic peripheral angiopathy without gangrene: Secondary | ICD-10-CM | POA: Insufficient documentation

## 2023-04-20 ENCOUNTER — Ambulatory Visit: Payer: 59 | Admitting: Physician Assistant

## 2023-04-20 DIAGNOSIS — L97812 Non-pressure chronic ulcer of other part of right lower leg with fat layer exposed: Secondary | ICD-10-CM | POA: Diagnosis not present

## 2023-04-20 DIAGNOSIS — L97522 Non-pressure chronic ulcer of other part of left foot with fat layer exposed: Secondary | ICD-10-CM | POA: Diagnosis not present

## 2023-04-20 NOTE — Progress Notes (Signed)
INTISAR, LANDIN ([id number]) [id number] Page 1 of 4 Visit Report for 04/19/2023 Abuse Risk Screen Details Patient Name: Date of Service: [name] 04/19/2023 8:00 A M Medical Record Number: [id number] Patient Account Number: [id number] Date of Birth/Sex: Treating RN: November 12, 1961 (62 y.o. [name] Primary Care Halla Chopp: [name] Other Clinician: Referring Avinash Maltos: Treating Laiba Fuerte/Extender: [name], [name] Weeks in Treatment: 0 Abuse Risk Screen Items Answer ABUSE RISK SCREEN: Has anyone close to you tried to hurt or harm you recentlyo No Karla you feel uncomfortable with anyone in your familyo No Has anyone forced you Karla things that you didnt want to doo No Electronic Signature(Price) Signed: 04/19/2023 4:55:19 PM By: Karie Schwalbe RN Entered By: Karie Schwalbe on 04/19/2023 08:29:18 -------------------------------------------------------------------------------- Activities of Daily Living Details Patient Name: Date of Service: [name]. 04/19/2023 8:00 A M Medical Record Number: [id number] Patient Account Number: [id number] Date of Birth/Sex: Treating RN: 10-16-1961 (62 y.o. [name] Primary Care Vayda Dungee: [name] Other Clinician: Referring Kolbi Tofte: Treating Fines Kimberlin/Extender: [name], [name] Weeks in Treatment: 0 Activities of Daily Living Items Answer Activities of Daily Living (Please select one for each item) Drive Automobile Not Able T Medications ake Completely Able Use T elephone Completely Able Care for Appearance Completely Able Use T oilet Completely Able Bath / Shower Completely Able Dress Self Completely Able Feed Self Completely Able Walk Completely Able Get In / Out Bed Completely Able Housework Completely Able Prepare Meals Completely Able Handle Money Completely Able Shop for Self Completely Able Electronic Signature(Price) Signed: 04/19/2023 4:55:19  PM By: Karie Schwalbe RN Entered By: Karie Schwalbe on 04/19/2023 08:30:02 -------------------------------------------------------------------------------- Education Screening Details Patient Name: Date of Service: Karla Bigness, Karla [name]. 04/19/2023 8:00 A M Medical Record Number: [id number] Patient Account Number: [id number] Date of Birth/Sex: Treating RN: 09/27/1961 (61 y.o. [name] Primary Care Micole Delehanty: [name] Other Clinician: Referring Tanairy Payeur: Treating Elad Macphail/Extender: [name], [name] Weeks in TreatmentLAUNIE, SQUILLACE ([id number]) [phone].pdf Page 2 of 4 Learning Preferences/Education Level/Primary Language Learning Preference: Explanation, Demonstration, Printed Material Highest Education Level: High School Preferred Language: Economist Language Barrier: No Translator Needed: No Memory Deficit: No Emotional Barrier: No Cultural/Religious Beliefs Affecting Medical Care: No Physical Barrier Impaired Vision: Yes Glasses Impaired Hearing: No Decreased Hand dexterity: No Knowledge/Comprehension Knowledge Level: High Comprehension Level: High Ability to understand written instructions: High Ability to understand verbal instructions: High Motivation Anxiety Level: Calm Cooperation: Cooperative Education Importance: Acknowledges Need Interest in Health Problems: Asks Questions Perception: Coherent Willingness to Engage in Self-Management High Activities: Readiness to Engage in Self-Management High Activities: Electronic Signature(Price) Signed: 04/19/2023 4:55:19 PM By: Karie Schwalbe RN Entered By: Karie Schwalbe on 04/19/2023 08:31:50 -------------------------------------------------------------------------------- Fall Risk Assessment Details Patient Name: Date of Service: Karla Bigness, Karla [name]. 04/19/2023 8:00 A M Medical Record Number: [id number] Patient Account Number: [id number] Date of  Birth/Sex: Treating RN: 1961-09-19 (62 y.o. [name] Primary Care Deshannon Seide: [name] Other Clinician: Referring Rondle Lohse: Treating Karene Bracken/Extender: [name], [name] Weeks in Treatment: 0 Fall Risk Assessment Items Have you had 2 or more falls in the last 12 monthso 0 Yes Have you had any fall that resulted in injury in the last 12 monthso 0 Yes FALLS RISK SCREEN History of falling - immediate or within 3 months 0 No Secondary diagnosis (Karla you have 2 or more medical diagnoseso) 15 Yes Ambulatory aid None/bed rest/wheelchair/nurse 0 No Crutches/cane/walker  0 No Furniture 30 Yes Intravenous therapy Access/Saline/Heparin Lock 0 No Gait/Transferring Normal/ bed rest/ wheelchair 0 No Weak (short steps with or without shuffle, stooped but able to lift head while walking, may seek 10 Yes support from furniture) Impaired (short steps with shuffle, may have difficulty arising from chair, head down, impaired 0 No balance) Mental Status Oriented to own ability 0 No Overestimates or forgets limitations 0 No Risk Level: High Risk Score: 55 [name], [name] ([id number]) [phone] [id number] Page 3 of 4 Electronic Signature(Price) -------------------------------------------------------------------------------- Foot Assessment Details Patient Name: Date of Service: [name] 04/19/2023 8:00 A M Medical Record Number: [id number] Patient Account Number: [id number] Date of Birth/Sex: Treating RN: 02/18/1961 (62 y.o. [name] Primary Care Rhyland Hinderliter: [name] Other Clinician: Referring Gianella Chismar: Treating Gerline Ratto/Extender: [name], [name] Weeks in Treatment: 0 Foot Assessment Items Site Locations + = Sensation present, - = Sensation absent, C = Callus, U = Ulcer R = Redness, W = Warmth, M = Maceration, PU = Pre-ulcerative lesion F = Fissure, Price = Swelling, D = Dryness Assessment Right: Left: Other  Deformity: No No Prior Foot Ulcer: No No Prior Amputation: Yes Yes Charcot Joint: No No Ambulatory Status: Ambulatory With Help Assistance Device: Walker Gait: Steady Notes 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 04/19/2023 8:00 A M Medical Record Number: [id number] Patient Account Number: [id number] Date of Birth/Sex: Treating RN: [birth year] (62 y.o. [name] Primary Care Kaius Daino: [name] Other Clinician: Referring Franki Alcaide: Treating Breya Cass/Extender: [name], [name] Weeks in Treatment: 0 Height (in): 64 Weight (lbs): 225 [name], [name] ([id number]) [phone] [id number] Page 4 of 4 Body Mass Index (BMI): 38.6 Nutrition Risk Screening Items Score Screening NUTRITION RISK SCREEN: I have an illness or condition that made me change the kind and/or amount of food I eat 0 No I eat fewer than two meals per day 0 No I eat few fruits and vegetables, or milk products 0 No I have three or more drinks of beer, liquor or wine almost every day 0 No I have tooth or mouth problems that make it hard for me to eat 0 No I don't always have enough money to buy the food I need 0 No I eat alone most of the time 0 No I take three or more different prescribed or over-the-counter drugs a day 0 No Without wanting to, I have lost or gained 10 pounds in the last six months 0 No I am not always physically able to shop, cook and/or feed myself 0 No Nutrition Protocols Good Risk Protocol 0 No interventions needed Moderate Risk Protocol High Risk Proctocol Risk Level: Good Risk Score: 0 Electronic Signature(Price) Signed: 04/19/2023 4:55:19 PM By:  Karie Schwalbe RN Entered By: Karie Schwalbe on 04/19/2023 08:33:59

## 2023-04-21 ENCOUNTER — Encounter: Payer: Self-pay | Admitting: Licensed Clinical Social Worker

## 2023-04-25 ENCOUNTER — Ambulatory Visit: Payer: Self-pay

## 2023-04-25 NOTE — Patient Outreach (Signed)
  Care Coordination   04/25/2023 Name: [name] MRN: [id number] DOB: Sep 27, 1961   Care Coordination Outreach Attempts:  An unsuccessful telephone outreach was attempted for a scheduled appointment today.  Follow Up Plan:  No further outreach attempts will be made at this time. We have been unable to contact the patient to offer or enroll patient in care coordination services  Encounter Outcome:  No Answer   Care Coordination Interventions:  No, not indicated    Delsa Sale, RN, BSN, CCM Care Management Coordinator [facility] Care Management Direct Phone: [phone]

## 2023-04-26 ENCOUNTER — Encounter (HOSPITAL_BASED_OUTPATIENT_CLINIC_OR_DEPARTMENT_OTHER): Payer: 59 | Admitting: Physician Assistant

## 2023-04-26 DIAGNOSIS — E11621 Type 2 diabetes mellitus with foot ulcer: Secondary | ICD-10-CM | POA: Diagnosis not present

## 2023-04-26 DIAGNOSIS — L97812 Non-pressure chronic ulcer of other part of right lower leg with fat layer exposed: Secondary | ICD-10-CM | POA: Diagnosis not present

## 2023-04-26 DIAGNOSIS — E1151 Type 2 diabetes mellitus with diabetic peripheral angiopathy without gangrene: Secondary | ICD-10-CM | POA: Diagnosis not present

## 2023-04-26 DIAGNOSIS — I130 Hypertensive heart and chronic kidney disease with heart failure and stage 1 through stage 4 chronic kidney disease, or unspecified chronic kidney disease: Secondary | ICD-10-CM | POA: Diagnosis not present

## 2023-04-26 DIAGNOSIS — E1122 Type 2 diabetes mellitus with diabetic chronic kidney disease: Secondary | ICD-10-CM | POA: Diagnosis not present

## 2023-04-26 DIAGNOSIS — L97512 Non-pressure chronic ulcer of other part of right foot with fat layer exposed: Secondary | ICD-10-CM | POA: Diagnosis not present

## 2023-04-26 DIAGNOSIS — N1832 Chronic kidney disease, stage 3b: Secondary | ICD-10-CM | POA: Diagnosis not present

## 2023-04-26 DIAGNOSIS — L97522 Non-pressure chronic ulcer of other part of left foot with fat layer exposed: Secondary | ICD-10-CM | POA: Diagnosis not present

## 2023-04-26 DIAGNOSIS — E11622 Type 2 diabetes mellitus with other skin ulcer: Secondary | ICD-10-CM | POA: Diagnosis not present

## 2023-04-26 DIAGNOSIS — I87331 Chronic venous hypertension (idiopathic) with ulcer and inflammation of right lower extremity: Secondary | ICD-10-CM | POA: Diagnosis not present

## 2023-04-27 NOTE — Progress Notes (Addendum)
Karla, Price ([id number]) [id number] Page 1 of 11 Visit Report for 04/26/2023 Chief Complaint Document Details Patient Name: Date of Service: [name] 04/26/2023 3:30 PM Medical Record Number: [id number] Patient Account Number: [id number] Date of Birth/Sex: Treating RN: [birth year] (62 y.o. F) Primary Care Provider: [name] Other Clinician: Referring Provider: Treating Provider/Extender: [name], [name] Weeks in Treatment: 1 Information Obtained from: Patient Chief Complaint Bilateral LE Ulcers Electronic Signature(s) Signed: 04/26/2023 4:00:31 PM By: Allen Derry PA-C Entered By: Allen Derry on 04/26/2023 16:00:30 -------------------------------------------------------------------------------- Debridement Details Patient Name: Date of Service: Karla Bigness, DO Karla S. 04/26/2023 3:30 PM Medical Record Number: [id number] Patient Account Number: [id number] Date of Birth/Sex: Treating RN: [birth year] (62 y.o. Katrinka Price Primary Care Provider: [name] Other Clinician: Referring Provider: Treating Provider/Extender: [name], [name] Weeks in Treatment: 1 Debridement Performed for Assessment: Wound #24 Left,Lateral Foot Performed By: Physician Lenda Kelp, PA Debridement Type: Debridement Severity of Tissue Pre Debridement: Fat layer exposed Level of Consciousness (Pre-procedure): Awake and Alert Pre-procedure Verification/Time Out Yes - 14:02 Taken: Start Time: 14:02 Pain Control: Lidocaine 4% T opical Solution Percent of Wound Bed Debrided: 100% T Area Debrided (cm): otal 0.01 Tissue and other material debrided: Viable, Non-Viable, Callus, Slough, Subcutaneous, Slough Level: Skin/Subcutaneous Tissue Debridement Description: Excisional Instrument: Curette Bleeding: Minimum Hemostasis Achieved: Pressure End Time: 14:03 Procedural Pain: 0 Post Procedural Pain: 0 Response to Treatment:  Procedure was tolerated well Level of Consciousness (Post- Awake and Alert procedure): Post Debridement Measurements of Total Wound Length: (cm) 0.1 Width: (cm) 0.1 Depth: (cm) 0.1 Volume: (cm) 0.001 Character of Wound/Ulcer Post Debridement: Improved Severity of Tissue Post Debridement: Fat layer exposed Post Procedure Diagnosis Same as Pre-procedure Notes Scribed for Allen Derry III PA, by [employer] ([id number]) [id number] Page 2 of 11 Electronic Signature(s) Signed: 04/26/2023 4:47:25 PM By: Allen Derry PA-C Signed: 04/26/2023 5:42:48 PM By: Karie Schwalbe RN Entered By: Karie Schwalbe on 04/26/2023 16:09:12 -------------------------------------------------------------------------------- HPI Details Patient Name: Date of Service: Karla Bigness, DO Karla S. 04/26/2023 3:30 PM Medical Record Number: [id number] Patient Account Number: [id number] Date of Birth/Sex: Treating RN: [name] (62 y.o. F) Primary Care Provider: [name] Other Clinician: Referring Provider: Treating Provider/Extender: [name], [name] Weeks in Treatment: 1 History of Present Illness HPI Description: This 62 year old patient who has a very long significant history of diabetes mellitus, previous alcohol and nicotine abuse, chronic data disease, COPD, diabetes mellitus, height hypertension, critical lower limb ischemia with several wounds being managed at the wound Center at [facility] for over a year. She was recently in the ER at [facility] and was referred to our center. He has had a long history of critical limb ischemia and over a period of time has had balloon angioplasties in March 2016, endarterectomy of the left carotid, lower extremity angiogram and treatment by Dr. Nanetta Batty, several cardiac catheterizations. Most recently she had an x-ray of her left foot while in the ER which showed no acute abnormality. during this ER visit  she was started on ciprofloxacin and asked to continue with the wound care physicians at [facility]. Her last ABI done in June 2017 showed the right side to be 0.28 in the left side to be 0.48. Her right toe brachial index was 0.17 on the right and 0.27 on the left. her last hemoglobin A1c was 11.1% She continues to smoke about a pack of cigarettes a day. 03/28/2017 -- -- right  foot x-ray -- IMPRESSION:Areas of soft tissue swelling. Mild subluxation second PIP joint. No frank dislocation or fracture. No erosive change or bony destruction. No soft tissue ulceration or radiopaque foreign body evident by radiography. There is plantar fascia calcification with a nearby inferior calcaneal spur. There are foci of arterial vascular calcification. 04/04/2017 -- the patient continues to be noncompliant and continues to complain of a lot of pain and has not done anything about quitting smoking Readmission: 06/26/18 on evaluation today patient presents for reevaluation she has not been seen in our office since May 2018. Since she was last seen here in our office she has undergone testing at Dr. Durenda Age office where it was revealed that she had an ABI of 0.68 with her previous ABI being 0.64 and a left ABI of 0.38 with previous ABI being 0.34 this study which was performed on 04/05/18 was pretty much equivalent to the study performed on 11/22/17. The findings in the end revealed on the right would appear to be moderate right lower extremity arterial disease on the left Dr. Allyson Sabal states moderate in the report but unfortunately this appears to be much more severe at 0.38 compared to the right. Subsequently the patient was scheduled to have angiography with Dr. Allyson Sabal on 04/12/18. This was however canceled due to the fact that the patient was found to have chronic kidney disease stage III and it was to the point that he did not feel that it will be safe to pursue angiography at that point. She has not been  on any antibiotics recently. At this point Dr. Allyson Sabal has not rescheduled anything as far as the injured [place] according to the patient he is somewhat reluctant to do so. Nonetheless with her diminished blood flow this is gonna make it somewhat difficult for her to heal. 07/05/18; this is a patient I have not seen previously. She has very significant PAD as noted above. She apparently has had revascularization efforts by Dr. Allyson Sabal on the right on 3 occasions to the patient. She was supposed to have an attempted angiography on the left however this was canceled apparently because of stage III chronic renal failure. I will need to research all of this. She complains of significant pain in the wound and has claudication enough that when she walks to the end of the driveway she has to stop. She is been using silver alginate to the wounds on her legs and Iodoflex to the area on her left second toe. 07/13/18 on evaluation today patient's wounds actually appear to be doing about the same. She has an appointment she tells me within the next month that is September 2019 with Dr. Allyson Sabal to discuss options to see if there's anything else that you can recommend or do for her. Nonetheless obviously what we're trying to do is do what we can to save her leg and in turn prevent any additional worsening or damage. None in the meantime we been mainly trying to manage her ulcers as best we can. 07/27/18; some improvement in the multiplicity of wounds on her left lower extremity and foot. She's been using silver alginate. I was unable to determine that she actually has an appointment with Dr. Allyson Sabal. We are checking into this The patient's wound includes Left lateral foot, left plantar heel, her left anterior calf wound looks close to me, left fourth toe is very close to closed and the left medial calf is perhaps the largest open area READMISSION 02/19/2021 This is a now  62 year old woman with type 2 diabetes and continued  cigarette smoking. She has known PAD. She was last in this clinic in 2019 at that point with wounds on her left foot. She left in a nonhealed state. On 06/28/2019 I see she had a left femoropopliteal by vein and vascular with an amputation of the fifth ray. These managed to heal over. Her last arterial studies were in February 2021. This showed an absent waveform at the posterior tibial artery on the right a dampened monophasic dorsalis pedis on the right of 0.44. On the left again the PTA TA was absent her dorsalis pedis was 0.99 triphasic and her great toe pressure was 0.65. This would have been after her revascularization. Once again she tells me that Dr. Allyson Sabal has done revascularization on the right leg on 3 different occasions. She has a right transmetatarsal amputation apparently done by Dr. Lajoyce Corners remotely but I do not see a note for these right lower extremity revascularization but I may not of looked back far enough. In any case she says that the she has a wound on the plantar aspect of the right transmetatarsal amputation site there is been there for several months. More recently she fell and had a wound on her right medial lower leg she had 5 sutures placed in the ER and then subsequently she has developed an area on the medial lower leg which was a blister that opened up. Past medical history includes type 2 diabetes, PAD, chronic renal failure, congestive heart failure, right transmetatarsal amputation, left fifth ray amputation, continued tobacco abuse, atrial fibrillation and cirrhosis. We did not attempt an ABI on the right leg today because of pain 02/26/2021; patient we admitted to the clinic last week. She has what looks to be 2 areas on her right medial and right anterior lower leg that look more like venous wounds but she has 1 on the first met head at the base of her right transmet. She has known severe PAD. She complains of a lot of pain although some of this may be neuropathic. Is  difficult to exclude a component of claudication. PACIENCE, LAVERE ([id number]) [id number] Page 3 of 11 Use silver alginate on the wounds on her legs and Iodoflex on the area on the first met head. She has an appointment with Dr. Allyson Sabal on 5/25 although I will text him and discussed the situation. She is have poorly controlled diabetic. She has has stage IIIb chronic renal failure 4/22; patient presents for 1 week follow-up. The 2 areas on her right medial and right anterior lower leg appear well-healing. She has been using silver alginate to this area without issues. She has a first met head ulcer and it is unclear how long this has been there as it was discovered in the ED earlier this month when she was being evaluated for another issue. Iodoflex has been used at this area. 4/29; patient presents for 1 week follow-up. She has been using silver alginate to the leg wounds and Iodoflex to the plantar foot wound. She has had this wrapped with Coban and Kerlix. She has no concerns or complaints today. 5/6; this is a difficult wound on the right plantar foot transmit site. We are not making a lot of progress. She had 2 more venous looking wounds on the right medial and right anterior lower leg 1 of which is healed. Her appointment with Dr. Allyson Sabal is on 5/25 5/16; she did not tolerate the offloading shoe we gave her in  fact she had a fall with an abrasion on her right forearm she is now back in the modified small shoe which does not offload her foot properly. Her appoint with Dr. Allyson Sabal is still on 5/25 we have been using Iodoflex. The area on the right anterior lower leg is healed 7/18; patient presents for follow-up however has not been seen in 2 months. She was last seen at the end of May. She had a fall at the end of June and was hospitalized. She has been unable to follow-up since then. For the wound she has only been keeping it covered with gauze. She is scheduled for an  aortogram with lower extremity runoff at the end of July. 7/29; patient presents for 2-week follow-up. She has home health and they have been changing her dressings. She denies any issues and has no complaints today. She states she had a procedure where they opened up one of her vessels in her legs. She denies signs of infection. 8/5; patient presents for follow-up. She was recently in the hospital for bradycardia. She was noted to have cellulitis to the left lower extremity and Unna boot was placed due to blisters and increased swelling. She reports improvement to her left leg since being in the hospital and stability check her right plantar foot wound. She denies signs of infection. 8/23; since I last saw this patient a lot has happened. She still has the area over the right first met head in the setting of previous transmetatarsal amputation. Firstly most importantly she underwent revascularization of her occluded right SFA. She underwent directional atherectomy followed by drug-coated balloon angioplasty. She has no named vessel below the knee hopefully the revascularization of the right SFA will improve collateral flow. It is not felt however that she has any endovascular options. She had follow-up arterial studies noninvasive on 8/9. These showed an ABI of 0.44 at the right PTA. Monophasic waveforms. On the left her great toe ABI was 0.68. Her follow-up arterial Doppler showed a 50 to 74% stenosis in the proximal SFA and a 50 to 74% stenosis in the distal SFA mild to severe atherosclerosis noted throughout the extremity. Areas of shadowing plaque seen, unable to rule out higher grade stenosis she also had a fall apparently wearing a right foot forefoot off loader that we gave her. She therefore comes in in an ordinary running shoe today. Not certain if this is the fall that ended up in the hospital with bradycardia 9/6 area over the right first metatarsal head in the setting of her previous  amputation and severe PAD. She is also a diabetic with known PAD status post attempt at revascularization. She is continued smoker Again the separation of visits in the clinic is somewhat disturbing if we are going to consider her for a total contact cast. She is not wearing anything to offload this stating it causes imbalance especially the forefoot off loader. She basically comes in in bedroom slippers She also had a fall this morning about an hour ago. She has a skin tear on the left dorsal forearm 9/13; areas over the right first metatarsal head in the setting of previous TMA and severe PAD. Again she comes in here in slippers. We have been using Hydrofera Blue. We use MolecuLight on this which was essentially negative study 9/20; right first metatarsal head in the setting of her previous TMA and severe PAD. Same slipper type shoes. She says she cannot wear a forefoot off loader and for some reason  she will not wear surgical shoes. She says she is smoking half a pack per day. 9/30; right first metatarsal head. Absolutely no improvement in fact there is tunneling superiorly very close to bone. She does not offload this properly at all. We use MolecuLight on this 2 weeks ago that did not show any surface bacteria we have been using silver collagen is a dressing She tells me she is down to 3 cigarettes a day I have offered encouragement and a treatment plan 10/6; right first metatarsal head. Exposed bone this week which is not surprising. We have been using silver collagen. She is smoking 5 to 10 cigarettes a day 10/17; she has not been here in 10 days. The bone scraping that I did on 10/6 showed staph lugdunensis, staph epidermidis and Pseudomonas Alcalifenes. I put in for Septra DS 1 p.o. twice daily for 14 days last week would be but we could not reach her to start the antibiotic. Quinolones would have been a good alternative except she has multiple drug interactions. Septra would not cover what  ever the Pseudomonas is but I am not really sure of the relevance here. I am going to try her on the Septra for 2 weeks. She has a probing wound on the right TMA that probes to bone. She claims to have just about stop smoking I reviewed her arterial status. She underwent a left fem-tib bypass by Dr. Arbie Cookey in 06/28/2019. Her last angiogram was in July of this year by Dr. Allyson Sabal. This showed an occluded right SFA the popliteal and tibial vessels were also occluded the peroneal vessel filled by collaterals and filled the PT and DP by communicating collaterals. She was not felt to have any additional and vascular options. Dr. Allyson Sabal noted that she he had previously revascularized her right SFA 3 times. 10/25; she is tolerating the Septra but says it makes her nauseated. I am going to continue this for an additional 2 weeks perhaps for a total of 6 weeks. Her wound does not look too much different. There is on the right first met metatarsal head. There is probing areas around the tissue that is in the middle of the wound. There is no purulence I cannot feel bone. The original source was for a bone scraping. 11/8; right first metatarsal head. This does not probe to bone and there is no purulence. As far as I can tell she is completed the Septra although there were problems with exact length of time and I think compliance. In any case I gave her 6 weeks. I have reviewed her PAD above. 11/15; right first metatarsal head. Again protruding subcutaneous tissue but this does not have any adherence to surrounding skin. Undermining circumferentially. There is no palpable bone. Not grossly purulent. We have been using Iodoflex to try to get some adherence to clean off the surface but no real improvement. I do not think they actually had enough supplies listening to the patient. She completed the 6 weeks of Septra I gave her, but I am not sure about the compliance with the dates i.e. may have missed days taken 1 a day  etc. The patient has a vascular follow-up with Dr. Allyson Sabal this coming Friday i.e. in 3 days. By my read of arterial evaluations I do not think she is felt to be a candidate for any further revascularization. The last angiogram was in July. 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 She continues to smoke now up to 10  cigarettes a day. She is not in any pain. Wound has not improved but is certainly not worsened. I gave her 6 weeks of trimethoprim/sulfamethoxazole which she is completed in some fashion. 11/29 right first metatarsal head previous TMA. Wound actually looks somewhat better today still a lot of callus around the wound on debris on the wound surface. UNFORTUNATELY the patient missed her appointment with Dr. Allyson Sabal and is not rebooked apparently until sometime in February 12/13; right first metatarsal wound actually looks some smaller today. I did not debride this today. She is using Iodoflex. When she came in last week she had a large abscess on her left anterior lower leg apparently after falling we send her to the ER to have this drained which was done. I cannot see any cultures x-ray was negative. She was apparently given 2 doses of IV antibiotics and discharged on Bactrim DS twice daily which she is still taking. As mentioned I cannot see any cultures. 12/29; culture of the abscess on the left leg I did last time showed Serratia. We gave her antibiotics. I note she was in the ER next day with bleeding which they controlled she is on anticoagulants.She comes in today to clinic and the area on her right plantar foot is miraculously very hyperkeratotic but healed 02/06/2023 Ms. [name] is a 62 year old female with a past medical history of uncontrolled type 2 diabetes with TMA to the right foot and fifth ray amputation to left [name], [name] ([id number]) [id number] Page 4 of 11 foot. She reports a callus to the right first met head and on the  lateral aspect of the left foot. She saw podiatry 3 days ago and had the callus debrided. There are no open wounds at this time. She follows up with podiatry in 2 weeks. Readmission: 04-19-2023 upon evaluation today patient appears to be doing well all things considered in regard to her wounds. She does have several wounds however on her feet 1 on the left foot centrally along the lateral edge and then a wound on the right lower leg more posterior. On the right foot plantar distal at the transmetatarsal amputation site this is the worst. With that being said she tells me this has been going on for quite some time she has been seen by podiatry they have done some debridements in the past. With that being said based on what we are seeing I believe that the patient's primary issue on the right side is that she is having some issues here with her arterial flow. The ABI on the right is 0.57 on the left is 1.04. With that being said this reading on the right is getting necessitated further evaluation by vascular. She tells me she has been seen by Dr. Gery Pray in the past and he stated there was nothing further to be done for the right leg. With that being said I think that it would be good to have a second opinion with vascular on this. Patient does have a history of diabetes mellitus type 2, peripheral vascular disease, and hypertension. The previous ABIs were performed on January 11, 2023. 04-26-2023 upon evaluation today patient appears to be doing about the same in regard to her wounds. She still has not had the arterial study at this point. I think this is something needs to be done as quickly as possible. Fortunately I do not see any evidence of active infection locally or systemically which is great news. No fevers, chills, nausea, vomiting, or diarrhea.  Electronic Signature(s) Signed: 04/26/2023 4:29:58 PM By: Allen Derry PA-C Entered By: Allen Derry on 04/26/2023  16:29:58 -------------------------------------------------------------------------------- Physical Exam Details Patient Name: Date of Service: HA [name], DO Karla S. 04/26/2023 3:30 PM Medical Record Number: [id number] Patient Account Number: [id number] Date of Birth/Sex: Treating RN: Karla 28, Price (61 y.o. F) Primary Care Provider: [name] Other Clinician: Referring Provider: Treating Provider/Extender: [name], [name] Weeks in Treatment: 1 Constitutional Well-nourished and well-hydrated in no acute distress. Respiratory normal breathing without difficulty. Psychiatric this patient is able to make decisions and demonstrates good insight into disease process. Alert and Oriented x 3. pleasant and cooperative. Notes Upon inspection patient's wound bed actually showed signs of good granulation and epithelization at this point. Upon evaluation patient's wound bed actually showed signs of poor healing especially on the right I think she does have poor arterial perfusion and needs to be seen by vascular for second opinion regarding revascularization. Electronic Signature(s) Signed: 04/26/2023 4:30:23 PM By: Allen Derry PA-C Entered By: Allen Derry on 04/26/2023 16:30:23 -------------------------------------------------------------------------------- Physician Orders Details Patient Name: Date of Service: Karla Bigness, DO Karla S. 04/26/2023 3:30 PM Medical Record Number: [id number] Patient Account Number: [id number] Date of Birth/Sex: Treating RN: [birth year] (61 y.o. Katrinka Price Primary Care Provider: [name] Other Clinician: Referring Provider: Treating Provider/Extender: [name], [name] Weeks in Treatment: 1 Verbal / Phone Orders: No Diagnosis Coding ICD-10 Coding Code Description 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.522 Non-pressure chronic ulcer of other part of left foot with fat layer exposed L97.512 Non-pressure chronic  ulcer of other part of right foot with fat layer exposed E11.622 Type 2 diabetes mellitus with other skin ulcer [name], [name] ([id number]) [id number] Page 5 of 11 [phone] Non-pressure chronic ulcer of other part of right lower leg with fat layer exposed I73.89 Other specified peripheral vascular diseases I87.331 Chronic venous hypertension (idiopathic) with ulcer and inflammation of right lower extremity Follow-up Appointments ppointment in 1 week. Allen Derry PA Room 9 Return A Wednesday June 19th at 3:30pm Anesthetic (In clinic) Topical Lidocaine 5% applied to wound bed Edema Control - Lymphedema / SCD / Other Bilateral Lower Extremities Elevate legs to the level of the heart or above for 30 minutes daily and/or when sitting for 3-4 times a day throughout the day. Avoid standing for long periods of time. Patient to wear own compression stockings every day. Exercise regularly - As tolerated Moisturize legs daily. Other Edema Control Orders/Instructions: - Recommend Compression stocking 15-34mm/Hg. Brochure for Elastic Therapy with printed leg measurements. Wound Treatment Wound #23 - Metatarsal head first Wound Laterality: Right Cleanser: Soap and Water 1 x Per Day/30 Days Discharge Instructions: May shower and wash wound with dial antibacterial soap and water prior to dressing change. Cleanser: Wound Cleanser 1 x Per Day/30 Days Discharge Instructions: Cleanse the wound with wound cleanser prior to applying a clean dressing using gauze sponges, not tissue or cotton balls. Prim Dressing: IODOFLEX 0.9% Cadexomer Iodine Pad 4x6 cm 1 x Per Day/30 Days ary Discharge Instructions: In clinic Apply to wound bed as instructed Prim Dressing: Iodosorb Gel 10 (gm) Tube (Generic) 1 x Per Day/30 Days ary Discharge Instructions: at home Apply to wound bed as instructed Secondary Dressing: Bordered Gauze, 2x2 in (Generic) 1 x Per Day/30 Days Discharge Instructions:  Apply over primary dressing as directed. Wound #24 - Foot Wound Laterality: Left, Lateral Cleanser: Soap and Water 1 x Per Day/30 Days Discharge Instructions: May shower and wash wound with  dial antibacterial soap and water prior to dressing change. Cleanser: Wound Cleanser 1 x Per Day/30 Days Discharge Instructions: Cleanse the wound with wound cleanser prior to applying a clean dressing using gauze sponges, not tissue or cotton balls. Prim Dressing: Maxorb Extra Calcium Alginate, 2x2 (in/in) (Generic) 1 x Per Day/30 Days ary Discharge Instructions: Apply to wound bed as instructed Secondary Dressing: Bordered Gauze, 2x2 in (Generic) 1 x Per Day/30 Days Discharge Instructions: Apply over primary dressing as directed. Wound #25 - Lower Leg Wound Laterality: Right, Posterior Cleanser: Soap and Water 1 x Per Day/30 Days Discharge Instructions: May shower and wash wound with dial antibacterial soap and water prior to dressing change. Cleanser: Wound Cleanser 1 x Per Day/30 Days Discharge Instructions: Cleanse the wound with wound cleanser prior to applying a clean dressing using gauze sponges, not tissue or cotton balls. Prim Dressing: Maxorb Extra Calcium Alginate, 2x2 (in/in) (Generic) 1 x Per Day/30 Days ary Discharge Instructions: Apply to wound bed as instructed Secondary Dressing: Bordered Gauze, 2x3.75 in (Generic) 1 x Per Day/30 Days Discharge Instructions: Apply over primary dressing as directed. Electronic Signature(s) Signed: 04/26/2023 4:47:25 PM By: Allen Derry PA-C Signed: 04/26/2023 5:42:48 PM By: Karie Schwalbe RN Entered By: Karie Schwalbe on 04/26/2023 16:14:05 Problem List Details -------------------------------------------------------------------------------- [name] ([id number]) [id number] Page 6 of 11 Patient Name: Date of Service: [name] 04/26/2023 3:30 PM Medical Record Number: [id number] Patient Account Number:  [id number] Date of Birth/Sex: Treating RN: [birth year] (62 y.o. F) Primary Care Provider: [name] Other Clinician: Referring Provider: Treating Provider/Extender: [name], [name] Weeks in Treatment: 1 Active Problems ICD-10 Encounter Code Description Active Date MDM Diagnosis 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 2 diabetes mellitus with foot ulcer 04/19/2023 No Yes L97.522 Non-pressure chronic ulcer of other part of left foot with fat layer exposed 04/19/2023 No Yes L97.512 Non-pressure chronic ulcer of other part of right foot with fat layer exposed 04/19/2023 No Yes E11.622 Type 2 diabetes mellitus with other skin ulcer 04/19/2023 No Yes L97.812 Non-pressure chronic ulcer of other part of right lower leg with fat layer 04/19/2023 No Yes exposed I73.89 Other specified peripheral vascular diseases 04/19/2023 No Yes I87.331 Chronic venous hypertension (idiopathic) with ulcer and inflammation of right 04/19/2023 No Yes lower extremity Inactive Problems Resolved Problems Electronic Signature(s) Signed: 04/26/2023 4:00:21 PM By: Allen Derry PA-C Entered By: Allen Derry on 04/26/2023 16:00:21 -------------------------------------------------------------------------------- Progress Note Details Patient Name: Date of Service: Karla Bigness, DO Karla S. 04/26/2023 3:30 PM Medical Record Number: [id number] Patient Account Number: [id number] Date of Birth/Sex: Treating RN: [birth year] (61 y.o. F) Primary Care Provider: [name] Other Clinician: Referring Provider: Treating Provider/Extender: [name], [name] Weeks in Treatment: 1 Subjective Chief Complaint Information obtained from Patient Bilateral LE Ulcers History of Present Illness (HPI) This 63 year old patient who has a very long significant history of diabetes mellitus, previous alcohol and nicotine abuse, chronic data disease, COPD, diabetes mellitus, height hypertension, critical lower limb ischemia with several wounds being  managed at the wound Center at [facility] for over a year. She was recently in the ER at [facility] and was referred to our center. He has had a long history of critical limb ischemia and over a period of time has had balloon angioplasties in March 2016, endarterectomy of the left carotid, [name], [name] ([id number]) [id number] Page 7 of 11 lower extremity angiogram and treatment by Dr. Nanetta Batty, several cardiac catheterizations. Most recently she had an x-ray of her left  foot while in the ER which showed no acute abnormality. during this ER visit she was started on ciprofloxacin and asked to continue with the wound care physicians at [facility]. Her last ABI done in June 2017 showed the right side to be 0.28 in the left side to be 0.48. Her right toe brachial index was 0.17 on the right and 0.27 on the left. her last hemoglobin A1c was 11.1% She continues to smoke about a pack of cigarettes a day. 03/28/2017 -- -- right foot x-ray -- IMPRESSION:Areas of soft tissue swelling. Mild subluxation second PIP joint. No frank dislocation or fracture. No erosive change or bony destruction. No soft tissue ulceration or radiopaque foreign body evident by radiography. There is plantar fascia calcification with a nearby inferior calcaneal spur. There are foci of arterial vascular calcification. 04/04/2017 -- the patient continues to be noncompliant and continues to complain of a lot of pain and has not done anything about quitting smoking Readmission: 06/26/18 on evaluation today patient presents for reevaluation she has not been seen in our office since May 2018. Since she was last seen here in our office she has undergone testing at Dr. Durenda Age office where it was revealed that she had an ABI of 0.68 with her previous ABI being 0.64 and a left ABI of 0.38 with previous ABI being 0.34 this study which was performed on 04/05/18 was pretty much equivalent to the  study performed on 11/22/17. The findings in the end revealed on the right would appear to be moderate right lower extremity arterial disease on the left Dr. Allyson Sabal states moderate in the report but unfortunately this appears to be much more severe at 0.38 compared to the right. Subsequently the patient was scheduled to have angiography with Dr. Allyson Sabal on 04/12/18. This was however canceled due to the fact that the patient was found to have chronic kidney disease stage III and it was to the point that he did not feel that it will be safe to pursue angiography at that point. She has not been on any antibiotics recently. At this point Dr. Allyson Sabal has not rescheduled anything as far as the injured [place] according to the patient he is somewhat reluctant to do so. Nonetheless with her diminished blood flow this is gonna make it somewhat difficult for her to heal. 07/05/18; this is a patient I have not seen previously. She has very significant PAD as noted above. She apparently has had revascularization efforts by Dr. Allyson Sabal on the right on 3 occasions to the patient. She was supposed to have an attempted angiography on the left however this was canceled apparently because of stage III chronic renal failure. I will need to research all of this. She complains of significant pain in the wound and has claudication enough that when she walks to the end of the driveway she has to stop. She is been using silver alginate to the wounds on her legs and Iodoflex to the area on her left second toe. 07/13/18 on evaluation today patient's wounds actually appear to be doing about the same. She has an appointment she tells me within the next month that is September 2019 with Dr. Allyson Sabal to discuss options to see if there's anything else that you can recommend or do for her. Nonetheless obviously what we're trying to do is do what we can to save her leg and in turn prevent any additional worsening or damage. None in the meantime we  been mainly trying to manage  her ulcers as best we can. 07/27/18; some improvement in the multiplicity of wounds on her left lower extremity and foot. She's been using silver alginate. I was unable to determine that she actually has an appointment with Dr. Allyson Sabal. We are checking into this The patient's wound includes Left lateral foot, left plantar heel, her left anterior calf wound looks close to me, left fourth toe is very close to closed and the left medial calf is perhaps the largest open area READMISSION 02/19/2021 This is a now 62 year old woman with type 2 diabetes and continued cigarette smoking. She has known PAD. She was last in this clinic in 2019 at that point with wounds on her left foot. She left in a nonhealed state. On 06/28/2019 I see she had a left femoropopliteal by vein and vascular with an amputation of the fifth ray. These managed to heal over. Her last arterial studies were in February 2021. This showed an absent waveform at the posterior tibial artery on the right a dampened monophasic dorsalis pedis on the right of 0.44. On the left again the PTA TA was absent her dorsalis pedis was 0.99 triphasic and her great toe pressure was 0.65. This would have been after her revascularization. Once again she tells me that Dr. Allyson Sabal has done revascularization on the right leg on 3 different occasions. She has a right transmetatarsal amputation apparently done by Dr. Lajoyce Corners remotely but I do not see a note for these right lower extremity revascularization but I may not of looked back far enough. In any case she says that the she has a wound on the plantar aspect of the right transmetatarsal amputation site there is been there for several months. More recently she fell and had a wound on her right medial lower leg she had 5 sutures placed in the ER and then subsequently she has developed an area on the medial lower leg which was a blister that opened up. Past medical history includes type 2  diabetes, PAD, chronic renal failure, congestive heart failure, right transmetatarsal amputation, left fifth ray amputation, continued tobacco abuse, atrial fibrillation and cirrhosis. We did not attempt an ABI on the right leg today because of pain 02/26/2021; patient we admitted to the clinic last week. She has what looks to be 2 areas on her right medial and right anterior lower leg that look more like venous wounds but she has 1 on the first met head at the base of her right transmet. She has known severe PAD. She complains of a lot of pain although some of this may be neuropathic. Is difficult to exclude a component of claudication. Use silver alginate on the wounds on her legs and Iodoflex on the area on the first met head. She has an appointment with Dr. Allyson Sabal on 5/25 although I will text him and discussed the situation. She is have poorly controlled diabetic. She has has stage IIIb chronic renal failure 4/22; patient presents for 1 week follow-up. The 2 areas on her right medial and right anterior lower leg appear well-healing. She has been using silver alginate to this area without issues. She has a first met head ulcer and it is unclear how long this has been there as it was discovered in the ED earlier this month when she was being evaluated for another issue. Iodoflex has been used at this area. 4/29; patient presents for 1 week follow-up. She has been using silver alginate to the leg wounds and Iodoflex to the plantar foot  wound. She has had this wrapped with Coban and Kerlix. She has no concerns or complaints today. 5/6; this is a difficult wound on the right plantar foot transmit site. We are not making a lot of progress. She had 2 more venous looking wounds on the right medial and right anterior lower leg 1 of which is healed. Her appointment with Dr. Allyson Sabal is on 5/25 5/16; she did not tolerate the offloading shoe we gave her in fact she had a fall with an abrasion on her right forearm  she is now back in the modified small shoe which does not offload her foot properly. Her appoint with Dr. Allyson Sabal is still on 5/25 we have been using Iodoflex. The area on the right anterior lower leg is healed 7/18; patient presents for follow-up however has not been seen in 2 months. She was last seen at the end of May. She had a fall at the end of June and was hospitalized. She has been unable to follow-up since then. For the wound she has only been keeping it covered with gauze. She is scheduled for an aortogram with lower extremity runoff at the end of July. 7/29; patient presents for 2-week follow-up. She has home health and they have been changing her dressings. She denies any issues and has no complaints today. She states she had a procedure where they opened up one of her vessels in her legs. She denies signs of infection. 8/5; patient presents for follow-up. She was recently in the hospital for bradycardia. She was noted to have cellulitis to the left lower extremity and Unna boot was placed due to blisters and increased swelling. She reports improvement to her left leg since being in the hospital and stability check her right plantar foot wound. She denies signs of infection. 8/23; since I last saw this patient a lot has happened. She still has the area over the right first met head in the setting of previous transmetatarsal amputation. Firstly most importantly she underwent revascularization of her occluded right SFA. She underwent directional atherectomy followed by drug-coated balloon angioplasty. She has no named vessel below the knee hopefully the revascularization of the right SFA will improve collateral flow. It is not felt however that she has any endovascular options. [name], [name] ([id number]) [id number] Page 8 of 11 She had follow-up arterial studies noninvasive on 8/9. These showed an ABI of 0.44 at the right PTA. Monophasic waveforms. On the left her  great toe ABI was 0.68. Her follow-up arterial Doppler showed a 50 to 74% stenosis in the proximal SFA and a 50 to 74% stenosis in the distal SFA mild to severe atherosclerosis noted throughout the extremity. Areas of shadowing plaque seen, unable to rule out higher grade stenosis she also had a fall apparently wearing a right foot forefoot off loader that we gave her. She therefore comes in in an ordinary running shoe today. Not certain if this is the fall that ended up in the hospital with bradycardia 9/6 area over the right first metatarsal head in the setting of her previous amputation and severe PAD. She is also a diabetic with known PAD status post attempt at revascularization. She is continued smoker Again the separation of visits in the clinic is somewhat disturbing if we are going to consider her for a total contact cast. She is not wearing anything to offload this stating it causes imbalance especially the forefoot off loader. She basically comes in in bedroom slippers She also had a fall  this morning about an hour ago. She has a skin tear on the left dorsal forearm 9/13; areas over the right first metatarsal head in the setting of previous TMA and severe PAD. Again she comes in here in slippers. We have been using Hydrofera Blue. We use MolecuLight on this which was essentially negative study 9/20; right first metatarsal head in the setting of her previous TMA and severe PAD. Same slipper type shoes. She says she cannot wear a forefoot off loader and for some reason she will not wear surgical shoes. She says she is smoking half a pack per day. 9/30; right first metatarsal head. Absolutely no improvement in fact there is tunneling superiorly very close to bone. She does not offload this properly at all. We use MolecuLight on this 2 weeks ago that did not show any surface bacteria we have been using silver collagen is a dressing She tells me she is down to 3 cigarettes a day I have offered  encouragement and a treatment plan 10/6; right first metatarsal head. Exposed bone this week which is not surprising. We have been using silver collagen. She is smoking 5 to 10 cigarettes a day 10/17; she has not been here in 10 days. The bone scraping that I did on 10/6 showed staph lugdunensis, staph epidermidis and Pseudomonas Alcalifenes. I put in for Septra DS 1 p.o. twice daily for 14 days last week would be but we could not reach her to start the antibiotic. Quinolones would have been a good alternative except she has multiple drug interactions. Septra would not cover what ever the Pseudomonas is but I am not really sure of the relevance here. I am going to try her on the Septra for 2 weeks. She has a probing wound on the right TMA that probes to bone. She claims to have just about stop smoking I reviewed her arterial status. She underwent a left fem-tib bypass by Dr. Arbie Cookey in 06/28/2019. Her last angiogram was in July of this year by Dr. Allyson Sabal. This showed an occluded right SFA the popliteal and tibial vessels were also occluded the peroneal vessel filled by collaterals and filled the PT and DP by communicating collaterals. She was not felt to have any additional and vascular options. Dr. Allyson Sabal noted that she he had previously revascularized her right SFA 3 times. 10/25; she is tolerating the Septra but says it makes her nauseated. I am going to continue this for an additional 2 weeks perhaps for a total of 6 weeks. Her wound does not look too much different. There is on the right first met metatarsal head. There is probing areas around the tissue that is in the middle of the wound. There is no purulence I cannot feel bone. The original source was for a bone scraping. 11/8; right first metatarsal head. This does not probe to bone and there is no purulence. As far as I can tell she is completed the Septra although there were problems with exact length of time and I think compliance. In any case  I gave her 6 weeks. I have reviewed her PAD above. 11/15; right first metatarsal head. Again protruding subcutaneous tissue but this does not have any adherence to surrounding skin. Undermining circumferentially. There is no palpable bone. Not grossly purulent. We have been using Iodoflex to try to get some adherence to clean off the surface but no real improvement. I do not think they actually had enough supplies listening to the patient. She completed the 6  weeks of Septra I gave her, but I am not sure about the compliance with the dates i.e. may have missed days taken 1 a day etc. The patient has a vascular follow-up with Dr. Allyson Sabal this coming Friday i.e. in 3 days. By my read of arterial evaluations I do not think she is felt to be a candidate for any further revascularization. The last angiogram was in July. 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 She continues to smoke now up to 10 cigarettes a day. She is not in any pain. Wound has not improved but is certainly not worsened. I gave her 6 weeks of trimethoprim/sulfamethoxazole which she is completed in some fashion. 11/29 right first metatarsal head previous TMA. Wound actually looks somewhat better today still a lot of callus around the wound on debris on the wound surface. UNFORTUNATELY the patient missed her appointment with Dr. Allyson Sabal and is not rebooked apparently until sometime in February 12/13; right first metatarsal wound actually looks some smaller today. I did not debride this today. She is using Iodoflex.  When she came in last week she had a large abscess on her left anterior lower leg apparently after falling we send her to the ER to have this drained which was done. I cannot see any cultures x-ray was negative. She was apparently given 2 doses of IV antibiotics and discharged on Bactrim DS twice daily which she is still taking. As mentioned I cannot see any cultures. 12/29; culture of the abscess on the left leg  I did last time showed Serratia. We gave her antibiotics. I note she was in the ER next day with bleeding which they controlled she is on anticoagulants.She comes in today to clinic and the area on her right plantar foot is miraculously very hyperkeratotic but healed 02/06/2023 Ms. 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 She reports a callus to the right first met head and on the lateral aspect of the left foot. She saw podiatry 3 days ago and had the callus debrided. There are no open wounds at this time. She follows up with podiatry in 2 weeks. Readmission: 04-19-2023 upon evaluation today patient appears to be doing well all things considered in regard to her wounds. She does have several wounds however on her feet 1 on the left foot centrally along the lateral edge and then a wound on the right lower leg more posterior. On the right foot plantar distal at the transmetatarsal amputation site this is the worst. With that being said she tells me this has been going on for quite some time she has been seen by podiatry they have done some debridements in the past. With that being said based on what we are seeing I believe that the patient's primary issue on the right side is that she is having some issues here with her arterial flow. The ABI on the right is 0.57 on the left is 1.04. With that being said this reading on the right is getting necessitated further evaluation by vascular. She tells me she has been seen by Dr. Gery Pray in the past and he stated there was nothing further to be done for the right leg. With that being said I think that it would be good to have a second opinion with vascular on this. Patient does have a history of diabetes mellitus type  2, peripheral vascular disease, and hypertension. The previous ABIs were performed on January 11, 2023. 04-26-2023 upon  evaluation today patient appears to be doing about the same in regard to her wounds. She still has not had the arterial study at this point. I think this is something needs to be done as quickly as possible. Fortunately I do not see any evidence of active infection locally or systemically which is great news. No fevers, chills, nausea, vomiting, or diarrhea. [address] [name], [name] ([id number]) [id number] Page 9 of 11 Constitutional Well-nourished and well-hydrated in no acute distress. Vitals Time Taken: 3:34 PM, Height: 64 in, Weight: 225 lbs, BMI: 38.6, Temperature: 98.4 F, Pulse: 81 bpm, Respiratory Rate: 18 breaths/min, Blood Pressure: 127/53 mmHg, Capillary Blood Glucose: 159 mg/dl. Respiratory normal breathing without difficulty. Psychiatric this patient is able to make decisions and demonstrates good insight into disease process. Alert and Oriented x 3. pleasant and cooperative. General Notes: Upon inspection patient's wound bed actually showed signs of good granulation and epithelization at this point. Upon evaluation patient's wound bed actually showed signs of poor healing especially on the right I think she does have poor arterial perfusion and needs to be seen by vascular for second opinion regarding revascularization. Integumentary (Hair, Skin) Wound #23 status is Open. Original cause of wound was Gradually Appeared. The date acquired was: 04/18/2021. The wound has been in treatment 1 weeks. The wound is located on the Right Metatarsal head first. The wound measures 1cm length x 1.3cm width x 0.3cm depth; 1.021cm^2 area and 0.306cm^3 volume. There is Fat Layer (Subcutaneous Tissue) exposed. There is no tunneling or undermining noted. There is a medium amount of serosanguineous drainage noted. The wound margin is distinct with the outline attached to the wound base. There is small (1-33%) pink granulation within the wound bed. There is a large (67- 100%)  amount of necrotic tissue within the wound bed including Adherent Slough. The periwound skin appearance had no abnormalities noted for moisture. The periwound skin appearance had no abnormalities noted for color. The periwound skin appearance exhibited: Callus. Periwound temperature was noted as No Abnormality. Wound #24 status is Open. Original cause of wound was Gradually Appeared. The date acquired was: 02/06/2023. The wound has been in treatment 1 weeks. The wound is located on the Left,Lateral Foot. The wound measures 0.1cm length x 0.1cm width x 0.1cm depth; 0.008cm^2 area and 0.001cm^3 volume. There is Fat Layer (Subcutaneous Tissue) exposed. There is no tunneling or undermining noted. There is a medium amount of serosanguineous drainage noted. There is small (1-33%) pale granulation within the wound bed. There is a large (67-100%) amount of necrotic tissue within the wound bed including Eschar and Adherent Slough. The periwound skin appearance had no abnormalities noted for moisture. The periwound skin appearance had no abnormalities noted for color. The periwound skin appearance exhibited: Callus. Periwound temperature was noted as No Abnormality. Wound #25 status is Open. Original cause of wound was Gradually Appeared. The date acquired was: 04/03/2023. The wound has been in treatment 1 weeks. The wound is located on the Right,Posterior Lower Leg. The wound measures 0.6cm length x 0.5cm width x 0.1cm depth; 0.236cm^2 area and 0.024cm^3 volume. There is Fat Layer (Subcutaneous Tissue) exposed. There is no tunneling or undermining noted. There is a medium amount of serosanguineous drainage noted. There is large (67-100%) pink granulation within the wound bed. There is no necrotic tissue within the wound bed. The periwound skin appearance had no abnormalities noted for moisture. The periwound  skin appearance exhibited: Scarring, Hemosiderin Staining. Periwound temperature was noted as  No Abnormality. 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 There was a Excisional Skin/Subcutaneous Tissue Debridement with a total area of 0.01 sq cm performed by Lenda Kelp, PA. With the following instrument(s): Curette to remove Viable and Non-Viable tissue/material. Material removed includes Callus, Subcutaneous Tissue, and Slough after achieving pain control using Lidocaine 4% T opical Solution. No specimens were taken. A time out was conducted at 14:02, prior to the start of the procedure. A Minimum amount of bleeding was controlled with Pressure. The procedure was tolerated well with a pain level of 0 throughout and a pain level of 0 following the procedure. Post Debridement Measurements: 0.1cm length x 0.1cm width x 0.1cm depth; 0.001cm^3 volume. Character of Wound/Ulcer Post Debridement is improved. Severity of Tissue Post Debridement is: Fat layer exposed. Post procedure Diagnosis Wound #24: Same as Pre-Procedure General Notes: Scribed for Allen Derry III PA, by J.Scotton. Plan Follow-up Appointments: Return Appointment in 1 week. Allen Derry PA Room 9 Wednesday June 19th at 3:30pm Anesthetic: (In clinic) Topical Lidocaine 5% applied to wound bed Edema Control - Lymphedema / SCD / Other: Elevate legs to the level of the heart or  above for 30 minutes daily and/or when sitting for 3-4 times a day throughout the day. [name], [name] ([id number]) [id number] Page 10 of 11 Avoid standing for long periods of time. Patient to wear own compression stockings every day. Exercise regularly - As tolerated Moisturize legs daily. Other Edema Control Orders/Instructions: - Recommend Compression stocking 15-41mm/Hg. Brochure for El[employer] Therapy with printed leg measurements. WOUND #23: - Metatarsal head first Wound Laterality: Right Cleanser: Soap and Water 1 x Per Day/30 Days Discharge Instructions: May shower and wash wound with dial antibacterial soap and water prior to dressing change. Cleanser: Wound Cleanser 1 x Per Day/30 Days Discharge Instructions: Cleanse the wound with wound cleanser prior to applying a clean dressing using gauze sponges, not tissue or cotton balls. Prim Dressing: IODOFLEX 0.9% Cadexomer Iodine Pad 4x6 cm 1 x Per Day/30 Days ary Discharge Instructions: In clinic Apply to wound bed as instructed Prim Dressing: Iodosorb Gel 10 (gm) Tube (Generic) 1 x Per Day/30 Days ary Discharge Instructions: at home Apply to wound bed as instructed Secondary Dressing: Bordered Gauze, 2x2 in (Generic) 1 x Per Day/30 Days Discharge Instructions: Apply over primary dressing as directed. WOUND #24: - Foot Wound Laterality: Left, Lateral Cleanser: Soap and Water 1 x Per Day/30 Days Discharge Instructions: May shower and wash wound with dial antibacterial soap and water prior to dressing change. Cleanser: Wound Cleanser 1 x Per Day/30 Days Discharge Instructions: Cleanse the wound with wound cleanser prior to applying a clean dressing using gauze sponges, not tissue or cotton balls. Prim Dressing: Maxorb Extra Calcium Alginate, 2x2 (in/in) (Generic) 1 x Per Day/30 Days ary Discharge Instructions: Apply to wound bed as instructed Secondary Dressing:  Bordered Gauze, 2x2 in (Generic) 1 x Per Day/30  Days Discharge Instructions: Apply over primary dressing as directed. WOUND #25: - Lower Leg Wound Laterality: Right, Posterior Cleanser: Soap and Water 1 x Per Day/30 Days Discharge Instructions: May shower and wash wound with dial antibacterial soap and water prior to dressing change. Cleanser: Wound Cleanser 1 x Per Day/30 Days Discharge Instructions: Cleanse the wound with wound cleanser prior to applying a clean dressing using gauze sponges, not tissue or cotton balls. Prim Dressing: Maxorb Extra Calcium Alginate, 2x2 (in/in) (Generic) 1 x Per Day/30 Days ary Discharge Instructions: Apply to wound bed as instructed Secondary Dressing: Bordered Gauze, 2x3.75 in (Generic) 1 x Per Day/30 Days Discharge Instructions: Apply over primary dressing as directed. 1. Based on what I am seeing I do believe that the patient should continue to monitor for any signs of infection or worsening in general. I think that she is making excellent progress here. 2. I am going to recommend as well that she should continue to monitor for any signs of infection or worsening overall. I do believe that she is doing okay but again I think she probably needs better arterial flow in this right leg in order to be able to see this improve. 3. I am going to recommend we continue with Iodoflex on the right foot ulcer silver alginate on the right leg ulcer and the left foot ulcer. We will see patient back for reevaluation in 1 week here in the clinic. If anything worsens or changes patient will contact our office for additional recommendations. Electronic Signature(s) Signed: 04/26/2023 4:31:39 PM By: Allen Derry PA-C Entered By: Allen Derry on 04/26/2023 16:31:39 -------------------------------------------------------------------------------- SuperBill Details Patient Name: Date of Service: HA LEY, DO Karla S. 04/26/2023 Medical Record Number: [id number] Patient Account Number: [id number] Date of Birth/Sex: Treating  RN: [birth year] (61 y.o. F) Primary Care Provider: [name] Other Clinician: Referring Provider: Treating Provider/Extender: [name], [name] Weeks in Treatment: 1 Diagnosis Coding ICD-10 Codes Code Description 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.522 Non-pressure chronic ulcer of other part of left foot with fat layer exposed L97.512 Non-pressure chronic ulcer of other part of right foot with fat layer exposed E11.622 Type 2 diabetes mellitus with other skin ulcer L97.812 Non-pressure chronic ulcer of other part of right lower leg with fat layer exposed I73.89 Other specified peripheral vascular diseases I87.331 Chronic venous hypertension (idiopathic) with ulcer and inflammation of right lower extremity Facility Procedures : [place], DO CPT4 Code: [id number] Karla S ([id number]) Description: 11042 - DEB SUBQ TISSUE 20 SQ CM/< ICD-10 Diagnosis Description [phone] L97.522 Non-pressure chronic ulcer of other part of left foot with fat layer exposed Modifier: [id number] Page Quantity: 1 11 of 11 Physician Procedures : CPT4 Code Description Modifier [id number] 11042 - WC PHYS SUBQ TISS 20 SQ CM ICD-10 Diagnosis Description L97.522 Non-pressure chronic ulcer of other part of left foot with fat layer exposed Quantity: 1 Electronic Signature(s) Signed: 04/26/2023 4:39:01 PM By: Allen Derry PA-C Entered By: Allen Derry on 04/26/2023 16:39:00

## 2023-04-28 NOTE — Progress Notes (Signed)
KRICKET, LEN ([id number]) [id number] Page 1 of 8 Visit Report for 04/26/2023 Arrival Information Details Patient Name: Date of Service: [name] 04/26/2023 3:30 PM Medical Record Number: [id number] Patient Account Number: [id number] Date of Birth/Sex: Treating RN: [birth year] (62 y.o. F) Primary Care Gussie Towson: [name] Other Clinician: Referring Eve Rey: Treating Kesler Wickham/Extender: [name], [name] Weeks in Treatment: 1 Visit Information History Since Last Visit Added or deleted any medications: No Patient Arrived: Walker Any new allergies or adverse reactions: No Arrival Time: 15:34 Had a fall or experienced change in No Accompanied By: self activities of daily living that may affect Transfer Assistance: None risk of falls: Patient Identification Verified: Yes Signs or symptoms of abuse/neglect since last visito No Secondary Verification Process Completed: Yes Hospitalized since last visit: No Patient Has Alerts: Yes Implantable device outside of the clinic excluding No Patient Alerts: ABI R 0.57 (01/11/23) cellular tissue based products placed in the center ABI L 1.04 (01/11/23) since last visit: Has Dressing in Place as Prescribed: Yes Pain Present Now: Yes Electronic Signature(s) Signed: 04/26/2023 4:21:40 PM By: Thayer Dallas Entered By: Thayer Dallas on 04/26/2023 15:34:49 -------------------------------------------------------------------------------- Encounter Discharge Information Details Patient Name: Date of Service: [name], Karla Karla S. 04/26/2023 3:30 PM Medical Record Number: [id number] Patient Account Number: [id number] Date of Birth/Sex: Treating RN: 12-Dec-1960 (61 y.o. [name] Primary Care Paris Chiriboga: [name] Other Clinician: Referring Keimya Briddell: Treating Ameisha Mcclellan/Extender: [name], [name] Weeks in Treatment: 1 Encounter Discharge Information Items Post Procedure  Vitals Discharge Condition: Stable Temperature (F): 98.4 Ambulatory Status: Walker Pulse (bpm): 81 Discharge Destination: Home Respiratory Rate (breaths/min): 18 Transportation: Private Auto Blood Pressure (mmHg): 127/53 Accompanied By: self Schedule Follow-up Appointment: Yes Clinical Summary of Care: Patient Declined Electronic Signature(s) Signed: 04/26/2023 5:42:48 PM By: Karie Schwalbe RN Entered By: Karie Schwalbe on 04/26/2023 17:11:17 -------------------------------------------------------------------------------- Lower Extremity Assessment Details Patient Name: Date of Service: [name] 04/26/2023 3:30 PM Medical Record Number: [id number] Patient Account Number: [id number] Date of Birth/Sex: Treating RN: [birth year] (61 y.o. F) Primary Care Niyana Chesbro: [name] Other Clinician: Referring Yurem Viner: Treating Shanaia Sievers/Extender: [name], [name] Weeks in Treatment: 1 Edema Assessment Assessed: [Left: No] [Right: No] H[LeftYAMILETH, [name] ([id number])] [Right: [id number] Page 2 of 8] [Left: Edema] [Right: :] Calf Left: Right: Point of Measurement: 30 cm From Medial Instep 45.6 cm 45.6 cm Ankle Left: Right: Point of Measurement: 9 cm From Medial Instep 24 cm 23.5 cm Knee To Floor Left: Right: From Medial Instep 40 cm 40 cm Vascular Assessment Pulses: Dorsalis Pedis Palpable: [Left:Yes] [Right:Yes] Electronic Signature(s) Signed: 04/26/2023 5:42:48 PM By: Karie Schwalbe RN Entered By: Karie Schwalbe on 04/26/2023 15:54:03 -------------------------------------------------------------------------------- Multi-Disciplinary Care Plan Details Patient Name: Date of Service: [name], Karla Karla S. 04/26/2023 3:30 PM Medical Record Number: [id number] Patient Account Number: [id number] Date of Birth/Sex: Treating RN: 1961-05-03 (61 y.o. [name] Primary Care Tanisa Lagace: [name] Other Clinician: Referring  Katharina Jehle: Treating Gemini Bunte/Extender: [name], [name] Weeks in Treatment: 1 Active Inactive Wound/Skin Impairment Nursing Diagnoses: Impaired tissue integrity Goals: Patient/caregiver will verbalize understanding of skin care regimen Date Initiated: 04/19/2023 Target Resolution Date: 08/19/2023 Goal Status: Active Interventions: Assess ulceration(s) every visit Treatment Activities: Skin care regimen initiated : 04/19/2023 Notes: Electronic Signature(s) Signed: 04/26/2023 5:42:48 PM By: Karie Schwalbe RN Entered By: Karie Schwalbe on 04/26/2023 17:02:08 -------------------------------------------------------------------------------- Pain Assessment Details Patient Name: Date of Service: [name], Karla Karla S. 04/26/2023 3:30 PM Medical Record Number:  [id number] Patient Account Number: [id number] Date of Birth/Sex: Treating RN: [birth year] (62 y.o. F) Primary Care Jermone Geister: [name] Other Clinician: Referring Makaela Cando: Treating Carinne Brandenburger/Extender: [name], [name] Weeks in Treatment: 1 Carlyle, Carolynn Comment ([id number]) [id number] Page 3 of 8 Active Problems Location of Pain Severity and Description of Pain Patient Has Paino Yes Site Locations Pain Location: Generalized Pain, Pain in Ulcers Rate the pain. Current Pain Level: 10 Pain Management and Medication Current Pain Management: Electronic Signature(s) Signed: 04/26/2023 4:21:40 PM By: Thayer Dallas Entered By: Thayer Dallas on 04/26/2023 15:35:43 -------------------------------------------------------------------------------- Patient/Caregiver Education Details Patient Name: Date of Service: [name] 6/12/2024andnbsp3:30 PM Medical Record Number: [id number] Patient Account Number: [id number] Date of Birth/Gender: Treating RN: 02-19-1961 (62 y.o. [name] Primary Care Physician: [name] Other Clinician: Referring Physician: Treating  Physician/Extender: Juleen China Weeks in Treatment: 1 Education Assessment Education Provided To: Patient Education Topics Provided Wound/Skin Impairment: Methods: Explain/Verbal Responses: Return demonstration correctly Electronic Signature(s) Signed: 04/26/2023 5:42:48 PM By: Karie Schwalbe RN Entered By: Karie Schwalbe on 04/26/2023 17:02:27 -------------------------------------------------------------------------------- Wound Assessment Details Patient Name: Date of Service: [name], Karla Karla S. 04/26/2023 3:30 PM Medical Record Number: [id number] Patient Account Number: [id number] Date of Birth/Sex: Treating RN: 1961-04-19 (61 y.o. F) Primary Care Sacheen Arrasmith: [name] Other Clinician: Referring Kimon Loewen: Treating [name]/Extender: [name], [name] Weeks in Treatment: 1 Wound Status [name], [name] ([id number]) [id number] Page 4 of 8 Wound Number: 23 Primary Diabetic Wound/Ulcer of the Lower Extremity Etiology: Wound Location: Right Metatarsal head first Wound Open Wounding Event: Gradually Appeared Status: Date Acquired: 04/18/2021 Comorbid Chronic Obstructive Pulmonary Disease (COPD), Sleep Apnea, Weeks Of Treatment: 1 History: Arrhythmia, Congestive Heart Failure, Coronary Artery Disease, Clustered Wound: No Hypertension, Peripheral Arterial Disease, Cirrhosis , Type II Diabetes, Osteoarthritis, Neuropathy Photos Wound Measurements Length: (cm) 1 Width: (cm) 1.3 Depth: (cm) 0.3 Area: (cm) 1.021 Volume: (cm) 0.306 % Reduction in Area: -8.4% % Reduction in Volume: 35% Epithelialization: Small (1-33%) Tunneling: No Undermining: No Wound Description Classification: Grade 2 Wound Margin: Distinct, outline attached Exudate Amount: Medium Exudate Type: Serosanguineous Exudate Color: red, brown Foul Odor After Cleansing: No Slough/Fibrino Yes Wound Bed Granulation Amount: Small (1-33%) Exposed  Structure Granulation Quality: Pink Fascia Exposed: No Necrotic Amount: Large (67-100%) Fat Layer (Subcutaneous Tissue) Exposed: Yes Necrotic Quality: Adherent Slough Tendon Exposed: No Muscle Exposed: No Joint Exposed: No Bone Exposed: No Periwound Skin Texture Texture Color No Abnormalities Noted: No No Abnormalities Noted: Yes Callus: Yes Temperature / Pain Temperature: No Abnormality Moisture No Abnormalities Noted: Yes Treatment Notes Wound #23 (Metatarsal head first) Wound Laterality: Right Cleanser Soap and Water Discharge Instruction: May shower and wash wound with dial antibacterial soap and water prior to dressing change. Wound Cleanser Discharge Instruction: Cleanse the wound with wound cleanser prior to applying a clean dressing using gauze sponges, not tissue or cotton balls. Peri-Wound Care Topical Primary Dressing IODOFLEX 0.9% Cadexomer Iodine Pad 4x6 cm Discharge Instruction: In clinic Apply to wound bed as instructed Iodosorb Gel 10 (gm) Tube Discharge Instruction: at home Apply to wound bed as instructed OREAL, TIKKANEN ([id number]) [id number] Page 5 of 8 Secondary Dressing Bordered Gauze, 2x2 in Discharge Instruction: Apply over primary dressing as directed. Secured With Compression Wrap Compression Stockings Facilities manager) Signed: 04/26/2023 5:42:48 PM By: Karie Schwalbe RN Entered By: Karie Schwalbe on 04/26/2023 15:54:23 -------------------------------------------------------------------------------- Wound Assessment Details Patient Name: Date of Service: Karla LEY, Karla Karla S. 04/26/2023 3:30 PM Medical Record Number:  [id number] Patient Account Number: [id number] Date of Birth/Sex: Treating RN: 09/19/1961 (62 y.o. F) Primary Care Vivica Dobosz: [name] Other Clinician: Referring Naylah Cork: Treating Erik Burkett/Extender: [name], [name] Weeks in Treatment: 1 Wound Status Wound Number: 24  Primary Diabetic Wound/Ulcer of the Lower Extremity Etiology: Wound Location: Left, Lateral Foot Wound Open Wounding Event: Gradually Appeared Status: Date Acquired: 02/06/2023 Comorbid Chronic Obstructive Pulmonary Disease (COPD), Sleep Apnea, Weeks Of Treatment: 1 History: Arrhythmia, Congestive Heart Failure, Coronary Artery Disease, Clustered Wound: No Hypertension, Peripheral Arterial Disease, Cirrhosis , Type II Diabetes, Osteoarthritis, Neuropathy Photos Wound Measurements Length: (cm) 0.1 Width: (cm) 0.1 Depth: (cm) 0.1 Area: (cm) 0.008 Volume: (cm) 0.001 % Reduction in Area: 99.2% % Reduction in Volume: 98.9% Epithelialization: None Tunneling: No Undermining: No Wound Description Classification: Grade 1 Exudate Amount: Medium Exudate Type: Serosanguineous Exudate Color: red, brown Foul Odor After Cleansing: No Slough/Fibrino Yes Wound Bed Granulation Amount: Small (1-33%) Exposed Structure Granulation Quality: Pale Fascia Exposed: No Necrotic Amount: Large (67-100%) Fat Layer (Subcutaneous Tissue) Exposed: Yes Necrotic Quality: Eschar, Adherent Slough Tendon Exposed: No Muscle Exposed: No Joint Exposed: No Bone Exposed: No [address] [name], [name] ([id number]) [phone].pdf Page 6 of 8 Texture Color No Abnormalities Noted: No No Abnormalities Noted: Yes Callus: Yes Temperature / Pain Temperature: No Abnormality Moisture No Abnormalities Noted: Yes Treatment Notes Wound #24 (Foot) Wound Laterality: Left, Lateral Cleanser Soap and Water Discharge Instruction: May shower and wash wound with dial antibacterial soap and water prior to dressing change. Wound Cleanser Discharge Instruction: Cleanse the wound with wound cleanser prior to applying a clean dressing using gauze sponges, not tissue or cotton balls. Peri-Wound Care Topical Primary Dressing Maxorb Extra Calcium Alginate, 2x2 (in/in) Discharge Instruction:  Apply to wound bed as instructed Secondary Dressing Bordered Gauze, 2x2 in Discharge Instruction: Apply over primary dressing as directed. Secured With Compression Wrap Compression Stockings Facilities manager) Signed: 04/26/2023 5:42:48 PM By: Karie Schwalbe RN Entered By: Karie Schwalbe on 04/26/2023 15:54:45 -------------------------------------------------------------------------------- Wound Assessment Details Patient Name: Date of Service: [name], Karla Karla S. 04/26/2023 3:30 PM Medical Record Number: [id number] Patient Account Number: [id number] Date of Birth/Sex: Treating RN: [birth year] (61 y.o. F) Primary Care Allan Bacigalupi: [name] Other Clinician: Referring Zaylen Susman: Treating Navy Belay/Extender: [name], [name] Weeks in Treatment: 1 Wound Status Wound Number: 25 Primary Venous Leg Ulcer Etiology: Wound Location: Right, Posterior Lower Leg Wound Open Wounding Event: Gradually Appeared Status: Date Acquired: 04/03/2023 Comorbid Chronic Obstructive Pulmonary Disease (COPD), Sleep Apnea, Weeks Of Treatment: 1 History: Arrhythmia, Congestive Heart Failure, Coronary Artery Disease, Clustered Wound: No Hypertension, Peripheral Arterial Disease, Cirrhosis , Type II Diabetes, Osteoarthritis, Neuropathy Photos [name], [name] ([id number]) [id number] Page 7 of 8 Wound Measurements Length: (cm) 0.6 Width: (cm) 0.5 Depth: (cm) 0.1 Area: (cm) 0.236 Volume: (cm) 0.024 % Reduction in Area: 93.5% % Reduction in Volume: 93.4% Epithelialization: None Tunneling: No Undermining: No Wound Description Classification: Full Thickness Without Exposed Suppor Exudate Amount: Medium Exudate Type: Serosanguineous Exudate Color: red, brown t Structures Foul Odor After Cleansing: No Slough/Fibrino Yes Wound Bed Granulation Amount: Large (67-100%) Exposed Structure Granulation Quality: Pink Fascia Exposed: No Necrotic Amount:  None Present (0%) Fat Layer (Subcutaneous Tissue) Exposed: Yes Tendon Exposed: No Muscle Exposed: No Joint Exposed: No Bone Exposed: No Periwound Skin Texture Texture Color No Abnormalities Noted: No No Abnormalities Noted: No Scarring: Yes Hemosiderin Staining: Yes Moisture Temperature / Pain No Abnormalities Noted: Yes Temperature: No Abnormality Treatment Notes Wound #25 (Lower Leg) Wound Laterality:  Right, Posterior Cleanser Soap and Water Discharge Instruction: May shower and wash wound with dial antibacterial soap and water prior to dressing change. Wound Cleanser Discharge Instruction: Cleanse the wound with wound cleanser prior to applying a clean dressing using gauze sponges, not tissue or cotton balls. Peri-Wound Care Topical Primary Dressing Maxorb Extra Calcium Alginate, 2x2 (in/in) Discharge Instruction: Apply to wound bed as instructed Secondary Dressing Bordered Gauze, 2x3.75 in Discharge Instruction: Apply over primary dressing as directed. Secured With Compression Wrap Compression Stockings Facilities manager) Signed: 04/26/2023 5:42:48 PM By: Karie Schwalbe RN Entered By: Karie Schwalbe on 04/26/2023 15:54:59 -------------------------------------------------------------------------------- Vitals Details Patient Name: Date of Service: [name], Karla Karla S. 04/26/2023 3:30 PM Medical Record Number: [id number] Patient Account Number: [id number] Date of Birth/Sex: Treating RN: 1961-02-23 (61 y.o. F) Primary Care Scotlynn Noyes: [name] Other Clinician: Referring Kelly Ranieri: Treating Iven Earnhart/Extender: Juleen China Weskan, Carolynn Comment ([id number]) [id number] Page 8 of 8 Weeks in Treatment: 1 Vital Signs Time Taken: 15:34 Temperature (F): 98.4 Height (in): 64 Pulse (bpm): 81 Weight (lbs): 225 Respiratory Rate (breaths/min): 18 Body Mass Index (BMI): 38.6 Blood Pressure (mmHg): 127/53 Capillary Blood  Glucose (mg/dl): 295 Reference Range: 80 - 120 mg / dl Electronic Signature(s) Signed: 04/26/2023 4:21:40 PM By: Thayer Dallas Entered By: Thayer Dallas on 04/26/2023 15:34:18

## 2023-05-02 ENCOUNTER — Other Ambulatory Visit (HOSPITAL_COMMUNITY): Payer: Self-pay | Admitting: Cardiology

## 2023-05-02 ENCOUNTER — Other Ambulatory Visit: Payer: Self-pay | Admitting: Family Medicine

## 2023-05-02 DIAGNOSIS — E039 Hypothyroidism, unspecified: Secondary | ICD-10-CM

## 2023-05-02 DIAGNOSIS — M109 Gout, unspecified: Secondary | ICD-10-CM

## 2023-05-02 DIAGNOSIS — I10 Essential (primary) hypertension: Secondary | ICD-10-CM

## 2023-05-02 DIAGNOSIS — E1151 Type 2 diabetes mellitus with diabetic peripheral angiopathy without gangrene: Secondary | ICD-10-CM

## 2023-05-03 ENCOUNTER — Other Ambulatory Visit: Payer: Self-pay | Admitting: Family Medicine

## 2023-05-03 ENCOUNTER — Ambulatory Visit (HOSPITAL_BASED_OUTPATIENT_CLINIC_OR_DEPARTMENT_OTHER): Payer: 59 | Admitting: Physician Assistant

## 2023-05-03 DIAGNOSIS — M1A0410 Idiopathic chronic gout, right hand, without tophus (tophi): Secondary | ICD-10-CM

## 2023-05-05 ENCOUNTER — Telehealth: Payer: Self-pay | Admitting: Licensed Clinical Social Worker

## 2023-05-08 ENCOUNTER — Ambulatory Visit: Payer: 59 | Admitting: Podiatry

## 2023-05-08 DIAGNOSIS — E1142 Type 2 diabetes mellitus with diabetic polyneuropathy: Secondary | ICD-10-CM | POA: Diagnosis not present

## 2023-05-08 DIAGNOSIS — Z0001 Encounter for general adult medical examination with abnormal findings: Secondary | ICD-10-CM | POA: Diagnosis not present

## 2023-05-08 DIAGNOSIS — E1152 Type 2 diabetes mellitus with diabetic peripheral angiopathy with gangrene: Secondary | ICD-10-CM | POA: Diagnosis not present

## 2023-05-08 DIAGNOSIS — E039 Hypothyroidism, unspecified: Secondary | ICD-10-CM | POA: Diagnosis not present

## 2023-05-08 DIAGNOSIS — D6869 Other thrombophilia: Secondary | ICD-10-CM | POA: Diagnosis not present

## 2023-05-08 DIAGNOSIS — Z79899 Other long term (current) drug therapy: Secondary | ICD-10-CM | POA: Diagnosis not present

## 2023-05-08 DIAGNOSIS — F17210 Nicotine dependence, cigarettes, uncomplicated: Secondary | ICD-10-CM | POA: Diagnosis not present

## 2023-05-08 DIAGNOSIS — E11621 Type 2 diabetes mellitus with foot ulcer: Secondary | ICD-10-CM | POA: Diagnosis not present

## 2023-05-08 DIAGNOSIS — E213 Hyperparathyroidism, unspecified: Secondary | ICD-10-CM | POA: Diagnosis not present

## 2023-05-08 DIAGNOSIS — E1151 Type 2 diabetes mellitus with diabetic peripheral angiopathy without gangrene: Secondary | ICD-10-CM | POA: Diagnosis not present

## 2023-05-09 NOTE — Patient Instructions (Signed)
Visit Information  Thank you for taking time to visit with me today. Please don't hesitate to contact me if I can be of assistance to you.   Following are the goals we discussed today:   Goals Addressed             This Visit's Progress    Obtain Assistance with Wheelchair   On track    Activities and task to complete in order to accomplish goals.   Keep all upcoming appointments discussed today Continue with compliance of taking medication prescribed by Doctor Implement healthy coping skills discussed to assist with management of symptoms Continue utilizing [facility] for transportation needs to medical appts Contact LCSW or RNCM at [facility] if you are interested in having a Legal Aid referral completed to assist with housing needs Follow up with [facility]       Please call the care guide team at [phone] if you need to cancel or reschedule your appointment.   If you are experiencing a Mental Health or Behavioral Health Crisis or need someone to talk to, please call the Suicide and Crisis Lifeline: 988 call 911   Patient verbalizes understanding of instructions and care plan provided today and agrees to view in MyChart. Active MyChart status and patient understanding of how to access instructions and care plan via MyChart confirmed with patient.     [name], MSW, LCSW [facility] Care Management [place]  [facility] [email or web address] Phone [phone] 9:47 AM

## 2023-05-09 NOTE — Patient Outreach (Signed)
  Care Coordination   Follow Up Visit Note   05/05/2023 Name: [name] MRN: [id number] DOB: [birth year]  [name] is a 62 y.o. year old female who sees [name], MD for primary care. I spoke with  [name] by phone today.  What matters to the patients health and wellness today?  Strengthening support systems    Goals Addressed             This Visit's Progress    Obtain Assistance with Wheelchair   On track    Activities and task to complete in order to accomplish goals.   Keep all upcoming appointments discussed today Continue with compliance of taking medication prescribed by Doctor Implement healthy coping skills discussed to assist with management of symptoms Continue utilizing [facility] for transportation needs to medical appts Contact LCSW or RNCM at [facility] if you are interested in having a Legal Aid referral completed to assist with housing needs Follow up with [facility]        SDOH assessments and interventions completed:  No     Care Coordination Interventions:  Yes, provided  Interventions Today    Flowsheet Row Most Recent Value  Chronic Disease   Chronic disease during today's visit Diabetes, Congestive Heart Failure (CHF), Hypertension (HTN), Chronic Obstructive Pulmonary Disease (COPD)  General Interventions   General Interventions Discussed/Reviewed General Interventions Reviewed  Charlyne Mom is looking to strengthen support in the community. LCSW discussed strategies to assist that will promote positive mood]  Mental Health Interventions   Mental Health Discussed/Reviewed Mental Health Reviewed       Follow up plan: Follow up call scheduled for 2-4 weeks    Encounter Outcome:  Pt. Visit Completed   [name], MSW, LCSW [facility] Care Management [facility]  [facility] [email or web address] Phone [phone] 9:46 AM

## 2023-05-10 ENCOUNTER — Ambulatory Visit: Payer: Medicare Other | Admitting: Family Medicine

## 2023-05-10 ENCOUNTER — Ambulatory Visit: Payer: Self-pay | Admitting: Licensed Clinical Social Worker

## 2023-05-11 NOTE — Patient Instructions (Signed)
Visit Information  Thank you for taking time to visit with me today. Please don't hesitate to contact me if I can be of assistance to you.   Following are the goals we discussed today:   Goals Addressed             This Visit's Progress    COMPLETED: Obtain Supportive Resources   On track    Activities and task to complete in order to accomplish goals.   Keep all upcoming appointments discussed today Continue with compliance of taking medication prescribed by Doctor Implement healthy coping skills discussed to assist with management of symptoms Continue utilizing [facility] for transportation needs to medical appts Contact LCSW or RNCM at [facility] if you are interested in having a Legal Aid referral completed to assist with housing needs Follow up with [facility]        Please call the care guide team at [phone] if you need to cancel or reschedule your appointment.   If you are experiencing a Mental Health or Behavioral Health Crisis or need someone to talk to, please call the Suicide and Crisis Lifeline: 988 call 911   Patient verbalizes understanding of instructions and care plan provided today and agrees to view in MyChart. Active MyChart status and patient understanding of how to access instructions and care plan via MyChart confirmed with patient.     [name], MSW, LCSW [facility] Care Management [place]  [facility] [email or web address] Phone [phone] 3:15 PM

## 2023-05-11 NOTE — Patient Outreach (Signed)
  Care Coordination   Follow Up Visit Note   05/10/2023 Name: [name] MRN: [id number] DOB: September 26, 1961  [name] is a 62 y.o. year old female who sees [name], MD for primary care. I spoke with  [name] by phone today.  What matters to the patients health and wellness today? Establishing with new PCP office    Goals Addressed             This Visit's Progress    COMPLETED: Obtain Supportive Resources   On track    Activities and task to complete in order to accomplish goals.   Keep all upcoming appointments discussed today Continue with compliance of taking medication prescribed by Doctor Implement healthy coping skills discussed to assist with management of symptoms Continue utilizing [facility] for transportation needs to medical appts Contact LCSW or RNCM at [facility] if you are interested in having a Legal Aid referral completed to assist with housing needs Follow up with [facility]        SDOH assessments and interventions completed:  No     Care Coordination Interventions:  Yes, provided  Interventions Today    Flowsheet Row Most Recent Value  Chronic Disease   Chronic disease during today's visit Diabetes, Hypertension (HTN), Congestive Heart Failure (CHF), Chronic Obstructive Pulmonary Disease (COPD)  General Interventions   General Interventions Discussed/Reviewed General Interventions Reviewed, Doctor Visits  [Pt reports that she is establishing with a new PCP office, [facility]. Health Second appt is scheduled for 05/31/23]  Doctor Visits Discussed/Reviewed Doctor Visits Reviewed  Mental Health Interventions   Mental Health Discussed/Reviewed Mental Health Reviewed       Follow up plan: No further intervention required.   Encounter Outcome:  Pt. Visit Completed   [name], MSW, LCSW [facility] Care Management [facility] Health  [facility] [email or web address] Phone [phone] 3:15 PM

## 2023-05-16 DIAGNOSIS — Z7189 Other specified counseling: Secondary | ICD-10-CM | POA: Diagnosis not present

## 2023-05-17 ENCOUNTER — Encounter: Payer: 59 | Admitting: Vascular Surgery

## 2023-05-23 ENCOUNTER — Other Ambulatory Visit: Payer: Self-pay | Admitting: Student

## 2023-05-23 ENCOUNTER — Encounter: Payer: Self-pay | Admitting: Internal Medicine

## 2023-05-23 DIAGNOSIS — Z1231 Encounter for screening mammogram for malignant neoplasm of breast: Secondary | ICD-10-CM

## 2023-05-24 ENCOUNTER — Encounter (HOSPITAL_BASED_OUTPATIENT_CLINIC_OR_DEPARTMENT_OTHER): Payer: Medicare HMO | Admitting: Physician Assistant

## 2023-05-26 ENCOUNTER — Other Ambulatory Visit: Payer: Self-pay | Admitting: Student

## 2023-05-26 DIAGNOSIS — J449 Chronic obstructive pulmonary disease, unspecified: Secondary | ICD-10-CM

## 2023-05-30 ENCOUNTER — Ambulatory Visit: Payer: Medicare Other | Admitting: Family Medicine

## 2023-05-31 ENCOUNTER — Encounter (HOSPITAL_COMMUNITY): Payer: 59

## 2023-05-31 ENCOUNTER — Ambulatory Visit (HOSPITAL_BASED_OUTPATIENT_CLINIC_OR_DEPARTMENT_OTHER): Payer: 59 | Admitting: Physician Assistant

## 2023-05-31 ENCOUNTER — Encounter: Payer: Self-pay | Admitting: Student

## 2023-06-01 ENCOUNTER — Other Ambulatory Visit: Payer: Self-pay | Admitting: Family Medicine

## 2023-06-01 DIAGNOSIS — E1151 Type 2 diabetes mellitus with diabetic peripheral angiopathy without gangrene: Secondary | ICD-10-CM

## 2023-06-07 ENCOUNTER — Encounter (HOSPITAL_BASED_OUTPATIENT_CLINIC_OR_DEPARTMENT_OTHER): Payer: Medicare HMO | Attending: Physician Assistant | Admitting: Internal Medicine

## 2023-06-07 DIAGNOSIS — I130 Hypertensive heart and chronic kidney disease with heart failure and stage 1 through stage 4 chronic kidney disease, or unspecified chronic kidney disease: Secondary | ICD-10-CM | POA: Diagnosis not present

## 2023-06-07 DIAGNOSIS — E1151 Type 2 diabetes mellitus with diabetic peripheral angiopathy without gangrene: Secondary | ICD-10-CM | POA: Diagnosis not present

## 2023-06-07 DIAGNOSIS — I87331 Chronic venous hypertension (idiopathic) with ulcer and inflammation of right lower extremity: Secondary | ICD-10-CM | POA: Insufficient documentation

## 2023-06-07 DIAGNOSIS — N1832 Chronic kidney disease, stage 3b: Secondary | ICD-10-CM | POA: Insufficient documentation

## 2023-06-07 DIAGNOSIS — K7460 Unspecified cirrhosis of liver: Secondary | ICD-10-CM | POA: Diagnosis not present

## 2023-06-07 DIAGNOSIS — E11622 Type 2 diabetes mellitus with other skin ulcer: Secondary | ICD-10-CM | POA: Insufficient documentation

## 2023-06-07 DIAGNOSIS — Z89431 Acquired absence of right foot: Secondary | ICD-10-CM | POA: Insufficient documentation

## 2023-06-07 DIAGNOSIS — E11621 Type 2 diabetes mellitus with foot ulcer: Secondary | ICD-10-CM | POA: Insufficient documentation

## 2023-06-07 DIAGNOSIS — Z89432 Acquired absence of left foot: Secondary | ICD-10-CM | POA: Insufficient documentation

## 2023-06-07 DIAGNOSIS — F17210 Nicotine dependence, cigarettes, uncomplicated: Secondary | ICD-10-CM | POA: Insufficient documentation

## 2023-06-07 DIAGNOSIS — J449 Chronic obstructive pulmonary disease, unspecified: Secondary | ICD-10-CM | POA: Insufficient documentation

## 2023-06-07 DIAGNOSIS — L97522 Non-pressure chronic ulcer of other part of left foot with fat layer exposed: Secondary | ICD-10-CM | POA: Insufficient documentation

## 2023-06-07 DIAGNOSIS — L97512 Non-pressure chronic ulcer of other part of right foot with fat layer exposed: Secondary | ICD-10-CM | POA: Insufficient documentation

## 2023-06-07 DIAGNOSIS — I509 Heart failure, unspecified: Secondary | ICD-10-CM | POA: Diagnosis not present

## 2023-06-07 DIAGNOSIS — E1122 Type 2 diabetes mellitus with diabetic chronic kidney disease: Secondary | ICD-10-CM | POA: Insufficient documentation

## 2023-06-07 DIAGNOSIS — L97812 Non-pressure chronic ulcer of other part of right lower leg with fat layer exposed: Secondary | ICD-10-CM | POA: Insufficient documentation

## 2023-06-08 ENCOUNTER — Ambulatory Visit
Admission: RE | Admit: 2023-06-08 | Discharge: 2023-06-08 | Disposition: A | Discharge status: Home / Self Care | Payer: Medicare HMO | Source: Ambulatory Visit | Attending: Student | Admitting: Student

## 2023-06-08 DIAGNOSIS — Z1231 Encounter for screening mammogram for malignant neoplasm of breast: Secondary | ICD-10-CM | POA: Diagnosis not present

## 2023-06-08 NOTE — Progress Notes (Signed)
[name], [name] ([id number]) [phone] [id number] Page 1 of 1 Visit Report for 06/07/2023 Fall Risk Assessment Details Patient Name: Date of Service: [name] 06/07/2023 1:30 PM Medical Record Number: [id number] Patient Account Number: [id number] Date of Birth/Sex: Treating RN: 03-02-1961 (62 y.o. [name], Millard.Loa Primary Care Alysiana Ethridge: [name] Other Clinician: Referring Burnard Enis: Treating Ammarie Matsuura/Extender: Josephine Cables Weeks in Treatment: 7 Fall Risk Assessment Items Have you had 2 or more falls in the last 12 monthso 0 Yes Have you had any fall that resulted in injury in the last 12 monthso 0 No FALLS RISK SCREEN History of falling - immediate or within 3 months 25 Yes Secondary diagnosis (Do you have 2 or more medical diagnoseso) 0 No Ambulatory aid None/bed rest/wheelchair/nurse 0 No Crutches/cane/walker 15 Yes Furniture 0 No Intravenous therapy Access/Saline/Heparin Lock 0 No Gait/Transferring Normal/ bed rest/ wheelchair 0 No Weak (short steps with or without shuffle, stooped but able to lift head while walking, may seek 10 Yes support from furniture) Impaired (short steps with shuffle, may have difficulty arising from chair, head down, impaired 0 No balance) Mental Status Oriented to own ability 0 Yes Electronic Signature(s) Signed: 06/07/2023 6:08:49 PM By: Shawn Stall RN, BSN Entered By: Shawn Stall on 06/07/2023 15:02:36

## 2023-06-08 NOTE — Progress Notes (Signed)
MAKENZY, KRIST ([id number]) [id number] Page 1 of 12 Visit Report for 06/07/2023 Debridement Details Patient Name: Date of Service: [name] 06/07/2023 1:30 PM Medical Record Number: [id number] Patient Account Number: [id number] Date of Birth/Sex: Treating RN: 06/25/1961 (62 y.o. F) Primary Care Provider: [name] Other Clinician: Referring Provider: Treating Provider/Extender: Josephine Cables Weeks in Treatment: 7 Debridement Performed for Assessment: Wound #24 Left,Lateral Foot Performed By: Physician Maxwell Caul., MD Debridement Type: Debridement Severity of Tissue Pre Debridement: Fat layer exposed Level of Consciousness (Pre-procedure): Awake and Alert Pre-procedure Verification/Time Out Yes - 15:20 Taken: Start Time: 15:21 Pain Control: Lidocaine 4% T opical Solution Percent of Wound Bed Debrided: 100% T Area Debrided (cm): otal 4.71 Tissue and other material debrided: Viable, Non-Viable, Callus, Slough, Subcutaneous, Skin: Dermis , Skin: Epidermis, Slough Level: Skin/Subcutaneous Tissue Debridement Description: Excisional Instrument: Blade Bleeding: Minimum Hemostasis Achieved: Pressure End Time: 15:30 Procedural Pain: 0 Post Procedural Pain: 0 Response to Treatment: Procedure was tolerated well Level of Consciousness (Post- Awake and Alert procedure): Post Debridement Measurements of Total Wound Length: (cm) 3 Width: (cm) 2 Depth: (cm) 0.4 Volume: (cm) 1.885 Character of Wound/Ulcer Post Debridement: Improved Severity of Tissue Post Debridement: Fat layer exposed Post Procedure Diagnosis Same as Pre-procedure Electronic Signature(s) Signed: 06/07/2023 4:12:50 PM By: Baltazar Najjar MD Entered By: Baltazar Najjar on 06/07/2023 15:56:31 -------------------------------------------------------------------------------- HPI Details Patient Name: Date of Service: Karla Bigness, DO Karla S. 06/07/2023 1:30  PM Medical Record Number: [id number] Patient Account Number: [id number] Date of Birth/Sex: Treating RN: January 23, 1961 (62 y.o. F) Primary Care Provider: [name] Other Clinician: Referring Provider: Treating Provider/Extender: Luster Landsberg, Sherlie Ban in Treatment: [address] ([id number]) [id number] Page 2 of 12 History of Present Illness HPI Description: This 62 year old patient who has a very long significant history of diabetes mellitus, previous alcohol and nicotine abuse, chronic data disease, COPD, diabetes mellitus, height hypertension, critical lower limb ischemia with several wounds being managed at the wound Center at [facility] for over a year. She was recently in the ER at [facility] and was referred to our center. He has had a long history of critical limb ischemia and over a period of time has had balloon angioplasties in March 2016, endarterectomy of the left carotid, lower extremity angiogram and treatment by Dr. Nanetta Batty, several cardiac catheterizations. Most recently she had an x-ray of her left foot while in the ER which showed no acute abnormality. during this ER visit she was started on ciprofloxacin and asked to continue with the wound care physicians at [facility]. Her last ABI done in June 2017 showed the right side to be 0.28 in the left side to be 0.48. Her right toe brachial index was 0.17 on the right and 0.27 on the left. her last hemoglobin A1c was 11.1% She continues to smoke about a pack of cigarettes a day. 03/28/2017 -- -- right foot x-ray -- IMPRESSION:Areas of soft tissue swelling. Mild subluxation second PIP joint. No frank dislocation or fracture. No erosive change or bony destruction. No soft tissue ulceration or radiopaque foreign body evident by radiography. There is plantar fascia calcification with a nearby inferior calcaneal spur. There are foci of arterial vascular  calcification. 04/04/2017 -- the patient continues to be noncompliant and continues to complain of a lot of pain and has not done anything about quitting smoking Readmission: 06/26/18 on evaluation today patient presents for reevaluation she has not been seen in our office since May  2018. Since she was last seen here in our office she has undergone testing at Dr. Durenda Age office where it was revealed that she had an ABI of 0.68 with her previous ABI being 0.64 and a left ABI of 0.38 with previous ABI being 0.34 this study which was performed on 04/05/18 was pretty much equivalent to the study performed on 11/22/17. The findings in the end revealed on the right would appear to be moderate right lower extremity arterial disease on the left Dr. Allyson Sabal states moderate in the report but unfortunately this appears to be much more severe at 0.38 compared to the right. Subsequently the patient was scheduled to have angiography with Dr. Allyson Sabal on 04/12/18. This was however canceled due to the fact that the patient was found to have chronic kidney disease stage III and it was to the point that he did not feel that it will be safe to pursue angiography at that point. She has not been on any antibiotics recently. At this point Dr. Allyson Sabal has not rescheduled anything as far as the injured [place] according to the patient he is somewhat reluctant to do so. Nonetheless with her diminished blood flow this is gonna make it somewhat difficult for her to heal. 07/05/18; this is a patient I have not seen previously. She has very significant PAD as noted above. She apparently has had revascularization efforts by Dr. Allyson Sabal on the right on 3 occasions to the patient. She was supposed to have an attempted angiography on the left however this was canceled apparently because of stage III chronic renal failure. I will need to research all of this. She complains of significant pain in the wound and has claudication enough that when  she walks to the end of the driveway she has to stop. She is been using silver alginate to the wounds on her legs and Iodoflex to the area on her left second toe. 07/13/18 on evaluation today patient's wounds actually appear to be doing about the same. She has an appointment she tells me within the next month that is September 2019 with Dr. Allyson Sabal to discuss options to see if there's anything else that you can recommend or do for her. Nonetheless obviously what we're trying to do is do what we can to save her leg and in turn prevent any additional worsening or damage. None in the meantime we been mainly trying to manage her ulcers as best we can. 07/27/18; some improvement in the multiplicity of wounds on her left lower extremity and foot. She's been using silver alginate. I was unable to determine that she actually has an appointment with Dr. Allyson Sabal. We are checking into this The patient's wound includes Left lateral foot, left plantar heel, her left anterior calf wound looks close to me, left fourth toe is very close to closed and the left medial calf is perhaps the largest open area READMISSION 02/19/2021 This is a now 62 year old woman with type 2 diabetes and continued cigarette smoking. She has known PAD. She was last in this clinic in 2019 at that point with wounds on her left foot. She left in a nonhealed state. On 06/28/2019 I see she had a left femoropopliteal by vein and vascular with an amputation of the fifth ray. These managed to heal over. Her last arterial studies were in February 2021. This showed an absent waveform at the posterior tibial artery on the right a dampened monophasic dorsalis pedis on the right of 0.44. On the left again  the PTA TA was absent her dorsalis pedis was 0.99 triphasic and her great toe pressure was 0.65. This would have been after her revascularization. Once again she tells me that Dr. Allyson Sabal has done revascularization on the right leg on 3 different occasions.  She has a right transmetatarsal amputation apparently done by Dr. Lajoyce Corners remotely but I do not see a note for these right lower extremity revascularization but I may not of looked back far enough. In any case she says that the she has a wound on the plantar aspect of the right transmetatarsal amputation site there is been there for several months. More recently she fell and had a wound on her right medial lower leg she had 5 sutures placed in the ER and then subsequently she has developed an area on the medial lower leg which was a blister that opened up. Past medical history includes type 2 diabetes, PAD, chronic renal failure, congestive heart failure, right transmetatarsal amputation, left fifth ray amputation, continued tobacco abuse, atrial fibrillation and cirrhosis. We did not attempt an ABI on the right leg today because of pain 02/26/2021; patient we admitted to the clinic last week. She has what looks to be 2 areas on her right medial and right anterior lower leg that look more like venous wounds but she has 1 on the first met head at the base of her right transmet. She has known severe PAD. She complains of a lot of pain although some of this may be neuropathic. Is difficult to exclude a component of claudication. Use silver alginate on the wounds on her legs and Iodoflex on the area on the first met head. She has an appointment with Dr. Allyson Sabal on 5/25 although I will text him and discussed the situation. She is have poorly controlled diabetic. She has has stage IIIb chronic renal failure 4/22; patient presents for 1 week follow-up. The 2 areas on her right medial and right anterior lower leg appear well-healing. She has been using silver alginate to this area without issues. She has a first met head ulcer and it is unclear how long this has been there as it was discovered in the ED earlier this month when she was being evaluated for another issue. Iodoflex has been used at this area. 4/29;  patient presents for 1 week follow-up. She has been using silver alginate to the leg wounds and Iodoflex to the plantar foot wound. She has had this wrapped with Coban and Kerlix. She has no concerns or complaints today. 5/6; this is a difficult wound on the right plantar foot transmit site. We are not making a lot of progress. She had 2 more venous looking wounds on the right medial and right anterior lower leg 1 of which is healed. Her appointment with Dr. Allyson Sabal is on 5/25 5/16; she did not tolerate the offloading shoe we gave her in fact she had a fall with an abrasion on her right forearm she is now back in the modified small shoe which does not offload her foot properly. Her appoint with Dr. Allyson Sabal is still on 5/25 we have been using Iodoflex. The area on the right anterior lower leg is healed 7/18; patient presents for follow-up however has not been seen in 2 months. She was last seen at the end of May. She had a fall at the end of June and was hospitalized. She has been unable to follow-up since then. For the wound she has only been keeping it covered with gauze.  She is scheduled for an aortogram with lower extremity runoff at the end of July. 7/29; patient presents for 2-week follow-up. She has home health and they have been changing her dressings. She denies any issues and has no complaints today. She states she had a procedure where they opened up one of her vessels in her legs. She denies signs of infection. 8/5; patient presents for follow-up. She was recently in the hospital for bradycardia. She was noted to have cellulitis to the left lower extremity and [address] [name], [name] ([id number]) [id number] Page 3 of 12 was placed due to blisters and increased swelling. She reports improvement to her left leg since being in the hospital and stability check her right plantar foot wound. She denies signs of infection. 8/23; since I last saw this patient a lot has  happened. She still has the area over the right first met head in the setting of previous transmetatarsal amputation. Firstly most importantly she underwent revascularization of her occluded right SFA. She underwent directional atherectomy followed by drug-coated balloon angioplasty. She has no named vessel below the knee hopefully the revascularization of the right SFA will improve collateral flow. It is not felt however that she has any endovascular options. She had follow-up arterial studies noninvasive on 8/9. These showed an ABI of 0.44 at the right PTA. Monophasic waveforms. On the left her great toe ABI was 0.68. Her follow-up arterial Doppler showed a 50 to 74% stenosis in the proximal SFA and a 50 to 74% stenosis in the distal SFA mild to severe atherosclerosis noted throughout the extremity. Areas of shadowing plaque seen, unable to rule out higher grade stenosis she also had a fall apparently wearing a right foot forefoot off loader that we gave her. She therefore comes in in an ordinary running shoe today. Not certain if this is the fall that ended up in the hospital with bradycardia 9/6 area over the right first metatarsal head in the setting of her previous amputation and severe PAD. She is also a diabetic with known PAD status post attempt at revascularization. She is continued smoker Again the separation of visits in the clinic is somewhat disturbing if we are going to consider her for a total contact cast. She is not wearing anything to offload this stating it causes imbalance especially the forefoot off loader. She basically comes in in bedroom slippers She also had a fall this morning about an hour ago. She has a skin tear on the left dorsal forearm 9/13; areas over the right first metatarsal head in the setting of previous TMA and severe PAD. Again she comes in here in slippers. We have been using Hydrofera Blue. We use MolecuLight on this which was essentially negative  study 9/20; right first metatarsal head in the setting of her previous TMA and severe PAD. Same slipper type shoes. She says she cannot wear a forefoot off loader and for some reason she will not wear surgical shoes. She says she is smoking half a pack per day. 9/30; right first metatarsal head. Absolutely no improvement in fact there is tunneling superiorly very close to bone. She does not offload this properly at all. We use MolecuLight on this 2 weeks ago that did not show any surface bacteria we have been using silver collagen is a dressing She tells me she is down to 3 cigarettes a day I have offered encouragement and a treatment plan 10/6; right first metatarsal head. Exposed bone this week which is not  surprising. We have been using silver collagen. She is smoking 5 to 10 cigarettes a day 10/17; she has not been here in 10 days. The bone scraping that I did on 10/6 showed staph lugdunensis, staph epidermidis and Pseudomonas Alcalifenes. I put in for Septra DS 1 p.o. twice daily for 14 days last week would be but we could not reach her to start the antibiotic. Quinolones would have been a good alternative except she has multiple drug interactions. Septra would not cover what ever the Pseudomonas is but I am not really sure of the relevance here. I am going to try her on the Septra for 2 weeks. She has a probing wound on the right TMA that probes to bone. She claims to have just about stop smoking I reviewed her arterial status. She underwent a left fem-tib bypass by Dr. Arbie Cookey in 06/28/2019. Her last angiogram was in July of this year by Dr. Allyson Sabal. This showed an occluded right SFA the popliteal and tibial vessels were also occluded the peroneal vessel filled by collaterals and filled the PT and DP by communicating collaterals. She was not felt to have any additional and vascular options. Dr. Allyson Sabal noted that she he had previously revascularized her right SFA 3 times. 10/25; she is tolerating the  Septra but says it makes her nauseated. I am going to continue this for an additional 2 weeks perhaps for a total of 6 weeks. Her wound does not look too much different. There is on the right first met metatarsal head. There is probing areas around the tissue that is in the middle of the wound. There is no purulence I cannot feel bone. The original source was for a bone scraping. 11/8; right first metatarsal head. This does not probe to bone and there is no purulence. As far as I can tell she is completed the Septra although there were problems with exact length of time and I think compliance. In any case I gave her 6 weeks. I have reviewed her PAD above. 11/15; right first metatarsal head. Again protruding subcutaneous tissue but this does not have any adherence to surrounding skin. Undermining circumferentially. There is no palpable bone. Not grossly purulent. We have been using Iodoflex to try to get some adherence to clean off the surface but no real improvement. I do not think they actually had enough supplies listening to the patient. She completed the 6 weeks of Septra I gave her, but I am not sure about the compliance with the dates i.e. may have missed days taken 1 a day etc. The patient has a vascular follow-up with Dr. Allyson Sabal this coming Friday i.e. in 3 days. By my read of arterial evaluations I do not think she is felt to be a candidate for any further revascularization. The last angiogram was in July. 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 She continues to smoke now up to 10 cigarettes a day. She is not in any pain. Wound has not improved but is certainly not worsened. I gave her 6 weeks of trimethoprim/sulfamethoxazole which she is completed in some fashion. 11/29 right first metatarsal head previous TMA. Wound actually looks somewhat better today still a lot of callus around the wound on debris on the wound surface. UNFORTUNATELY the patient missed her appointment  with Dr. Allyson Sabal and is not rebooked apparently until sometime in February 12/13; right first metatarsal wound actually looks some smaller today. I did not debride this today. She is using Iodoflex.  When she came in last week she had a large abscess on her left anterior lower leg apparently after falling we send her to the ER to have this drained which was done. I cannot see any cultures x-ray was negative. She was apparently given 2 doses of IV antibiotics and discharged on Bactrim DS twice daily which she is still taking. As mentioned I cannot see any cultures. 12/29; culture of the abscess on the left leg I did last time showed Serratia. We gave her antibiotics. I note she was in the ER next day with bleeding which they controlled she is on anticoagulants.She comes in today to clinic and the area on her right plantar foot is miraculously very hyperkeratotic but healed 02/06/2023 Ms. Abigaile Rossie is a 62 year old female with a past medical history of uncontrolled type 2 diabetes with TMA to the right foot and fifth ray amputation to left foot. She reports a callus to the right first met head and on the lateral aspect of the left foot. She saw podiatry 3 days ago and had the callus debrided. There are no open wounds at this time. She follows up with podiatry in 2 weeks. Readmission: 04-19-2023 upon evaluation today patient appears to be doing well all things considered in regard to her wounds. She does have several wounds however on her feet 1 on the left foot centrally along the lateral edge and then a wound on the right lower leg more posterior. On the right foot plantar distal at the transmetatarsal amputation site this is the worst. With that being said she tells me this has been going on for quite some time she has been seen by podiatry they have done some debridements in the past. With that being said based on what we are seeing I believe that the patient's primary issue on the right side is that  she is having some issues here with her arterial flow. The ABI on the right is 0.57 on the left is 1.04. With that being said this reading on the right is getting necessitated further evaluation by vascular. She tells me she has been seen by Dr. Gery Pray in the past and he stated there was nothing further to be done for the right leg. With that being said I think that it would be good to have a second opinion with vascular on this. Patient does have a history of diabetes mellitus type 2, peripheral vascular disease, and hypertension. The previous ABIs were performed on January 11, 2023. 04-26-2023 upon evaluation today patient appears to be doing about the same in regard to her wounds. She still has not had the arterial study at this point. I think this is something needs to be done as quickly as possible. Fortunately I do not see any evidence of active infection locally or systemically which is great news. No fevers, chills, nausea, vomiting, or diarrhea. 7/24; the patient has not been here in about a month and a half. She has open wounds on the plantar right foot second metatarsal head in the setting of a previous TMA, she has a new area on the left lateral foot and perhaps traumatic areas on the right anterior and left lateral lower legs. She has poorly controlled LINSI, [name] ([id number]) [id number] Page 4 of 12 edema. The patient has known severe PAD has had multiple interventions in the right SFA. Known occlusive disease on the right which is severe her last arterial studies were in February 2024 on the right her PTA  could not be measured absent. She had an pressure of 60 at the dorsalis pedis giving her an ABI of 0.57 monophasic waveforms here. On the left her study was a lot better with an ABI 1.04 triphasic waveforms. The ABI might be falsely elevated on the left. I think at her last angiogram she had occluded right SFA popliteal and tibial vessels with the peroneal  vessel being the only vessel feeding her foot via collaterals. She comes in this time in basic over-the-counter shoes Electronic Signature(s) Signed: 06/07/2023 4:12:50 PM By: Baltazar Najjar MD Entered By: Baltazar Najjar on 06/07/2023 16:00:37 -------------------------------------------------------------------------------- Physical Exam Details Patient Name: Date of Service: Karla Bigness, DO Karla S. 06/07/2023 1:30 PM Medical Record Number: [id number] Patient Account Number: [id number] Date of Birth/Sex: Treating RN: [birth year] (61 y.o. F) Primary Care Provider: [name] Other Clinician: Referring Provider: Treating Provider/Extender: Luster Landsberg, Odette Horns Weeks in Treatment: 7 Constitutional Sitting or standing Blood Pressure is within target range for patient.. Pulse regular and within target range for patient.[name] Respirations regular, non-labored and within target range.. Temperature is normal and within the target range for the patient.[name] Appears in no distress. Cardiovascular Dorsalis pedis pulses were palpable bilaterally perhaps reduced. Notes Wound exam; the patient has a deep punched-out area over what I think is the right second metatarsal head. Previous TMA on the right. On the left on the lateral foot at the base of the fifth metatarsal thick callus blister and some erythema I remove the skin and subcutaneous tissue revealing a quarter sized wound that does not have any depth. I do not think this is particularly infected On her upper legs she has traumatic wounds on the right anteriorly and the left laterally uncontrolled edema Electronic Signature(s) Signed: 06/07/2023 4:12:50 PM By: Baltazar Najjar MD Entered By: Baltazar Najjar on 06/07/2023 16:05:06 -------------------------------------------------------------------------------- Physician Orders Details Patient Name: Date of Service: Karla Bigness, DO Karla S. 06/07/2023 1:30 PM Medical Record Number: [id number] Patient  Account Number: [id number] Date of Birth/Sex: Treating RN: May 23, 1961 (61 y.o. [name], [name] Primary Care Provider: [name] Other Clinician: Referring Provider: Treating Provider/Extender: Josephine Cables Weeks in Treatment: 7 Verbal / Phone Orders: No Diagnosis Coding ICD-10 Coding Code Description 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.522 Non-pressure chronic ulcer of other part of left foot with fat layer exposed L97.512 Non-pressure chronic ulcer of other part of right foot with fat layer exposed E11.622 Type 2 diabetes mellitus with other skin ulcer [name], [name] ([id number]) [id number] Page 5 of 12 [phone] Non-pressure chronic ulcer of other part of right lower leg with fat layer exposed I73.89 Other specified peripheral vascular diseases I87.331 Chronic venous hypertension (idiopathic) with ulcer and inflammation of right lower extremity Follow-up Appointments ppointment in 1 week. Allen Derry PA Room 9 Return A Wednesday 315pm 06/14/2023 ppointment in 2 weeks. Allen Derry PA Room 9 Return A Wednesday 315pm 06/21/2023 Anesthetic (In clinic) Topical Lidocaine 5% applied to wound bed Bathing/ Shower/ Hygiene May shower with protection but do not get wound dressing(s) wet. Protect dressing(s) with water repellant cover (for example, large plastic bag) or a cast cover and may then take shower. Edema Control - Lymphedema / SCD / Other Bilateral Lower Extremities Elevate legs to the level of the heart or above for 30 minutes daily and/or when sitting for 3-4 times a day throughout the day. Avoid standing for long periods of time. Patient to wear own compression stockings every day. Exercise regularly - As tolerated  Moisturize legs daily. Other Edema Control Orders/Instructions: - Recommend Compression stocking 15-17mm/Hg. Brochure for [employer] with printed leg measurements. Wound Treatment Wound #23  - Metatarsal head first Wound Laterality: Right Cleanser: Soap and Water 1 x Per Week/30 Days Discharge Instructions: May shower and wash wound with dial antibacterial soap and water prior to dressing change. Cleanser: Wound Cleanser 1 x Per Week/30 Days Discharge Instructions: Cleanse the wound with wound cleanser prior to applying a clean dressing using gauze sponges, not tissue or cotton balls. Peri-Wound Care: Sween Lotion (Moisturizing lotion) 1 x Per Week/30 Days Discharge Instructions: Apply moisturizing lotion as directed Prim Dressing: Maxorb Extra Ag+ Alginate Dressing, 4x4.75 (in/in) 1 x Per Week/30 Days ary Discharge Instructions: Apply to wound bed as instructed Secondary Dressing: Optifoam Non-Adhesive Dressing, 4x4 in 1 x Per Week/30 Days Discharge Instructions: Apply foam donut and gauze Compression Wrap: Kerlix Roll 4.5x3.1 (in/yd) 1 x Per Week/30 Days Discharge Instructions: Apply Kerlix and Coban compression as directed. Compression Wrap: Coban Self-Adherent Wrap 4x5 (in/yd) 1 x Per Week/30 Days Discharge Instructions: Apply over Kerlix as directed. Wound #24 - Foot Wound Laterality: Left, Lateral Cleanser: Soap and Water 1 x Per Week/30 Days Discharge Instructions: May shower and wash wound with dial antibacterial soap and water prior to dressing change. Cleanser: Wound Cleanser 1 x Per Week/30 Days Discharge Instructions: Cleanse the wound with wound cleanser prior to applying a clean dressing using gauze sponges, not tissue or cotton balls. Peri-Wound Care: Sween Lotion (Moisturizing lotion) 1 x Per Week/30 Days Discharge Instructions: Apply moisturizing lotion as directed Prim Dressing: Maxorb Extra Ag+ Alginate Dressing, 4x4.75 (in/in) 1 x Per Week/30 Days ary Discharge Instructions: Apply to wound bed as instructed Secondary Dressing: Optifoam Non-Adhesive Dressing, 4x4 in 1 x Per Week/30 Days Discharge Instructions: Apply foam donut and gauze Compression Wrap:  Kerlix Roll 4.5x3.1 (in/yd) 1 x Per Week/30 Days Discharge Instructions: Apply Kerlix and Coban compression as directed. Compression Wrap: Coban Self-Adherent Wrap 4x5 (in/yd) 1 x Per Week/30 Days Discharge Instructions: Apply over Kerlix as directed. Wound #26 - Lower Leg Wound Laterality: Left, Lateral Cleanser: Soap and Water 1 x Per Week/30 Days Discharge Instructions: May shower and wash wound with dial antibacterial soap and water prior to dressing change. Cleanser: Wound Cleanser 1 x Per Week/30 Days [name], [name] ([id number]) [phone].pdf Page 6 of 12 Discharge Instructions: Cleanse the wound with wound cleanser prior to applying a clean dressing using gauze sponges, not tissue or cotton balls. Peri-Wound Care: Sween Lotion (Moisturizing lotion) 1 x Per Week/30 Days Discharge Instructions: Apply moisturizing lotion as directed Prim Dressing: Maxorb Extra Ag+ Alginate Dressing, 4x4.75 (in/in) 1 x Per Week/30 Days ary Discharge Instructions: Apply to wound bed as instructed Secondary Dressing: Woven Gauze Sponge, Non-Sterile 4x4 in 1 x Per Week/30 Days Discharge Instructions: Apply over primary dressing as directed. Compression Wrap: Kerlix Roll 4.5x3.1 (in/yd) 1 x Per Week/30 Days Discharge Instructions: Apply Kerlix and Coban compression as directed. Compression Wrap: Coban Self-Adherent Wrap 4x5 (in/yd) 1 x Per Week/30 Days Discharge Instructions: Apply over Kerlix as directed. Wound #27 - Lower Leg Wound Laterality: Right, Anterior Cleanser: Soap and Water 1 x Per Week/30 Days Discharge Instructions: May shower and wash wound with dial antibacterial soap and water prior to dressing change. Cleanser: Wound Cleanser 1 x Per Week/30 Days Discharge Instructions: Cleanse the wound with wound cleanser prior to applying a clean dressing using gauze sponges, not tissue or cotton balls. Peri-Wound Care: Sween Lotion (Moisturizing lotion) 1 x Per  Week/30  Days Discharge Instructions: Apply moisturizing lotion as directed Prim Dressing: Maxorb Extra Ag+ Alginate Dressing, 4x4.75 (in/in) 1 x Per Week/30 Days ary Discharge Instructions: Apply to wound bed as instructed Secondary Dressing: Woven Gauze Sponge, Non-Sterile 4x4 in 1 x Per Week/30 Days Discharge Instructions: Apply over primary dressing as directed. Compression Wrap: Kerlix Roll 4.5x3.1 (in/yd) 1 x Per Week/30 Days Discharge Instructions: Apply Kerlix and Coban compression as directed. Compression Wrap: Coban Self-Adherent Wrap 4x5 (in/yd) 1 x Per Week/30 Days Discharge Instructions: Apply over Kerlix as directed. Electronic Signature(s) Signed: 06/07/2023 4:12:50 PM By: Baltazar Najjar MD Signed: 06/07/2023 6:08:49 PM By: Shawn Stall RN, BSN Entered By: Shawn Stall on 06/07/2023 15:34:42 -------------------------------------------------------------------------------- Problem List Details Patient Name: Date of Service: Karla Bigness, DO Karla S. 06/07/2023 1:30 PM Medical Record Number: [id number] Patient Account Number: [id number] Date of Birth/Sex: Treating RN: Apr 09, 1961 (61 y.o. [name] Primary Care Provider: [name] Other Clinician: Referring Provider: Treating Provider/Extender: Josephine Cables Weeks in Treatment: 7 Active Problems ICD-10 Encounter Code Description Active Date MDM Diagnosis 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 2 diabetes mellitus with foot ulcer 04/19/2023 No Yes L97.522 Non-pressure chronic ulcer of other part of left foot with fat layer exposed 04/19/2023 No Yes [name], [name] ([id number]) [phone].pdf Page 7 of 12 [phone] Non-pressure chronic ulcer of other part of right foot with fat layer exposed 04/19/2023 No Yes E11.622 Type 2 diabetes mellitus with other skin ulcer 04/19/2023 No Yes L97.812 Non-pressure chronic ulcer of other part of right lower leg with fat layer 04/19/2023 No Yes exposed I73.89 Other specified peripheral  vascular diseases 04/19/2023 No Yes I87.331 Chronic venous hypertension (idiopathic) with ulcer and inflammation of right 04/19/2023 No Yes lower extremity Inactive Problems Resolved Problems Electronic Signature(s) Signed: 06/07/2023 4:12:50 PM By: Baltazar Najjar MD Entered By: Baltazar Najjar on 06/07/2023 15:56:04 -------------------------------------------------------------------------------- Progress Note Details Patient Name: Date of Service: Karla Bigness, DO Karla S. 06/07/2023 1:30 PM Medical Record Number: [id number] Patient Account Number: [id number] Date of Birth/Sex: Treating RN: September 09, 1961 (61 y.o. F) Primary Care Provider: [name] Other Clinician: Referring Provider: Treating Provider/Extender: Josephine Cables Weeks in Treatment: 7 Subjective History of Present Illness (HPI) This 62 year old patient who has a very long significant history of diabetes mellitus, previous alcohol and nicotine abuse, chronic data disease, COPD, diabetes mellitus, height hypertension, critical lower limb ischemia with several wounds being managed at the wound Center at [facility] for over a year. She was recently in the ER at [facility] and was referred to our center. He has had a long history of critical limb ischemia and over a period of time has had balloon angioplasties in March 2016, endarterectomy of the left carotid, lower extremity angiogram and treatment by Dr. Nanetta Batty, several cardiac catheterizations. Most recently she had an x-ray of her left foot while in the ER which showed no acute abnormality. during this ER visit she was started on ciprofloxacin and asked to continue with the wound care physicians at [facility]. Her last ABI done in June 2017 showed the right side to be 0.28 in the left side to be 0.48. Her right toe brachial index was 0.17 on the right and 0.27 on the left. her last hemoglobin A1c was 11.1% She continues to smoke about a pack of  cigarettes a day. 03/28/2017 -- -- right foot x-ray -- IMPRESSION:Areas of soft tissue swelling. Mild subluxation second PIP joint. No frank dislocation or fracture. No erosive change or bony destruction. No soft  tissue ulceration or radiopaque foreign body evident by radiography. There is plantar fascia calcification with a nearby inferior calcaneal spur. There are foci of arterial vascular calcification. 04/04/2017 -- the patient continues to be noncompliant and continues to complain of a lot of pain and has not done anything about quitting smoking Readmission: 06/26/18 on evaluation today patient presents for reevaluation she has not been seen in our office since May 2018. Since she was last seen here in our office she has undergone testing at Dr. Durenda Age office where it was revealed that she had an ABI of 0.68 with her previous ABI being 0.64 and a left ABI of 0.38 with previous ABI being 0.34 this study which was performed on 04/05/18 was pretty much equivalent to the study performed on 11/22/17. The findings in the end revealed on the right would appear to be moderate right lower extremity arterial disease on the left Dr. Allyson Sabal states moderate in the report but unfortunately this appears to be much more severe at 0.38 compared to the right. Subsequently the patient was scheduled to have angiography with Dr. Allyson Sabal on 04/12/18. This was however canceled due to the fact that the patient was found to have chronic kidney disease stage III and it was to the point that he did not feel that it will be safe to pursue angiography at that point. She has not been on any antibiotics recently. At this point Dr. Allyson Sabal has not rescheduled anything as far as the injured Alleene according to the patient he is somewhat reluctant to BIANA, HAGGAR ([id number]) [id number] Page 8 of 12 do so. Nonetheless with her diminished blood flow this is gonna make it somewhat difficult for her to  heal. 07/05/18; this is a patient I have not seen previously. She has very significant PAD as noted above. She apparently has had revascularization efforts by Dr. Allyson Sabal on the right on 3 occasions to the patient. She was supposed to have an attempted angiography on the left however this was canceled apparently because of stage III chronic renal failure. I will need to research all of this. She complains of significant pain in the wound and has claudication enough that when she walks to the end of the driveway she has to stop. She is been using silver alginate to the wounds on her legs and Iodoflex to the area on her left second toe. 07/13/18 on evaluation today patient's wounds actually appear to be doing about the same. She has an appointment she tells me within the next month that is September 2019 with Dr. Allyson Sabal to discuss options to see if there's anything else that you can recommend or do for her. Nonetheless obviously what we're trying to do is do what we can to save her leg and in turn prevent any additional worsening or damage. None in the meantime we been mainly trying to manage her ulcers as best we can. 07/27/18; some improvement in the multiplicity of wounds on her left lower extremity and foot. She's been using silver alginate. I was unable to determine that she actually has an appointment with Dr. Allyson Sabal. We are checking into this The patient's wound includes Left lateral foot, left plantar heel, her left anterior calf wound looks close to me, left fourth toe is very close to closed and the left medial calf is perhaps the largest open area READMISSION 02/19/2021 This is a now 62 year old woman with type 2 diabetes and continued cigarette smoking. She has known PAD. She was last  in this clinic in 2019 at that point with wounds on her left foot. She left in a nonhealed state. On 06/28/2019 I see she had a left femoropopliteal by vein and vascular with an amputation of the fifth ray. These  managed to heal over. Her last arterial studies were in February 2021. This showed an absent waveform at the posterior tibial artery on the right a dampened monophasic dorsalis pedis on the right of 0.44. On the left again the PTA TA was absent her dorsalis pedis was 0.99 triphasic and her great toe pressure was 0.65. This would have been after her revascularization. Once again she tells me that Dr. Allyson Sabal has done revascularization on the right leg on 3 different occasions. She has a right transmetatarsal amputation apparently done by Dr. Lajoyce Corners remotely but I do not see a note for these right lower extremity revascularization but I may not of looked back far enough. In any case she says that the she has a wound on the plantar aspect of the right transmetatarsal amputation site there is been there for several months. More recently she fell and had a wound on her right medial lower leg she had 5 sutures placed in the ER and then subsequently she has developed an area on the medial lower leg which was a blister that opened up. Past medical history includes type 2 diabetes, PAD, chronic renal failure, congestive heart failure, right transmetatarsal amputation, left fifth ray amputation, continued tobacco abuse, atrial fibrillation and cirrhosis. We did not attempt an ABI on the right leg today because of pain 02/26/2021; patient we admitted to the clinic last week. She has what looks to be 2 areas on her right medial and right anterior lower leg that look more like venous wounds but she has 1 on the first met head at the base of her right transmet. She has known severe PAD. She complains of a lot of pain although some of this may be neuropathic. Is difficult to exclude a component of claudication. Use silver alginate on the wounds on her legs and Iodoflex on the area on the first met head. She has an appointment with Dr. Allyson Sabal on 5/25 although I will text him and discussed the situation. She is have poorly  controlled diabetic. She has has stage IIIb chronic renal failure 4/22; patient presents for 1 week follow-up. The 2 areas on her right medial and right anterior lower leg appear well-healing. She has been using silver alginate to this area without issues. She has a first met head ulcer and it is unclear how long this has been there as it was discovered in the ED earlier this month when she was being evaluated for another issue. Iodoflex has been used at this area. 4/29; patient presents for 1 week follow-up. She has been using silver alginate to the leg wounds and Iodoflex to the plantar foot wound. She has had this wrapped with Coban and Kerlix. She has no concerns or complaints today. 5/6; this is a difficult wound on the right plantar foot transmit site. We are not making a lot of progress. She had 2 more venous looking wounds on the right medial and right anterior lower leg 1 of which is healed. Her appointment with Dr. Allyson Sabal is on 5/25 5/16; she did not tolerate the offloading shoe we gave her in fact she had a fall with an abrasion on her right forearm she is now back in the modified small shoe which does not offload her  foot properly. Her appoint with Dr. Allyson Sabal is still on 5/25 we have been using Iodoflex. The area on the right anterior lower leg is healed 7/18; patient presents for follow-up however has not been seen in 2 months. She was last seen at the end of May. She had a fall at the end of June and was hospitalized. She has been unable to follow-up since then. For the wound she has only been keeping it covered with gauze. She is scheduled for an aortogram with lower extremity runoff at the end of July. 7/29; patient presents for 2-week follow-up. She has home health and they have been changing her dressings. She denies any issues and has no complaints today. She states she had a procedure where they opened up one of her vessels in her legs. She denies signs of infection. 8/5; patient  presents for follow-up. She was recently in the hospital for bradycardia. She was noted to have cellulitis to the left lower extremity and Unna boot was placed due to blisters and increased swelling. She reports improvement to her left leg since being in the hospital and stability check her right plantar foot wound. She denies signs of infection. 8/23; since I last saw this patient a lot has happened. She still has the area over the right first met head in the setting of previous transmetatarsal amputation. Firstly most importantly she underwent revascularization of her occluded right SFA. She underwent directional atherectomy followed by drug-coated balloon angioplasty. She has no named vessel below the knee hopefully the revascularization of the right SFA will improve collateral flow. It is not felt however that she has any endovascular options. She had follow-up arterial studies noninvasive on 8/9. These showed an ABI of 0.44 at the right PTA. Monophasic waveforms. On the left her great toe ABI was 0.68. Her follow-up arterial Doppler showed a 50 to 74% stenosis in the proximal SFA and a 50 to 74% stenosis in the distal SFA mild to severe atherosclerosis noted throughout the extremity. Areas of shadowing plaque seen, unable to rule out higher grade stenosis she also had a fall apparently wearing a right foot forefoot off loader that we gave her. She therefore comes in in an ordinary running shoe today. Not certain if this is the fall that ended up in the hospital with bradycardia 9/6 area over the right first metatarsal head in the setting of her previous amputation and severe PAD. She is also a diabetic with known PAD status post attempt at revascularization. She is continued smoker Again the separation of visits in the clinic is somewhat disturbing if we are going to consider her for a total contact cast. She is not wearing anything to offload this stating it causes imbalance especially the  forefoot off loader. She basically comes in in bedroom slippers She also had a fall this morning about an hour ago. She has a skin tear on the left dorsal forearm 9/13; areas over the right first metatarsal head in the setting of previous TMA and severe PAD. Again she comes in here in slippers. We have been using Hydrofera Blue. We use MolecuLight on this which was essentially negative study 9/20; right first metatarsal head in the setting of her previous TMA and severe PAD. Same slipper type shoes. She says she cannot wear a forefoot off loader and for some reason she will not wear surgical shoes. She says she is smoking half a pack per day. 9/30; right first metatarsal head. Absolutely no improvement in fact  there is tunneling superiorly very close to bone. She does not offload this properly at all. We use MolecuLight on this 2 weeks ago that did not show any surface bacteria we have been using silver collagen is a dressing She tells me she is down to 3 cigarettes a day I have offered encouragement and a treatment plan 10/6; right first metatarsal head. Exposed bone this week which is not surprising. We have been using silver collagen. She is smoking 5 to 10 cigarettes a day 10/17; she has not been here in 10 days. The bone scraping that I did on 10/6 showed staph lugdunensis, staph epidermidis and Pseudomonas Alcalifenes. I put in for Septra DS 1 p.o. twice daily for 14 days last week would be but we could not reach her to start the antibiotic. Quinolones would have been a good [name], [name] ([id number]) [id number] Page 9 of 12 alternative except she has multiple drug interactions. Septra would not cover what ever the Pseudomonas is but I am not really sure of the relevance here. I am going to try her on the Septra for 2 weeks. She has a probing wound on the right TMA that probes to bone. She claims to have just about stop smoking I reviewed her arterial status. She  underwent a left fem-tib bypass by Dr. Arbie Cookey in 06/28/2019. Her last angiogram was in July of this year by Dr. Allyson Sabal. This showed an occluded right SFA the popliteal and tibial vessels were also occluded the peroneal vessel filled by collaterals and filled the PT and DP by communicating collaterals. She was not felt to have any additional and vascular options. Dr. Allyson Sabal noted that she he had previously revascularized her right SFA 3 times. 10/25; she is tolerating the Septra but says it makes her nauseated. I am going to continue this for an additional 2 weeks perhaps for a total of 6 weeks. Her wound does not look too much different. There is on the right first met metatarsal head. There is probing areas around the tissue that is in the middle of the wound. There is no purulence I cannot feel bone. The original source was for a bone scraping. 11/8; right first metatarsal head. This does not probe to bone and there is no purulence. As far as I can tell she is completed the Septra although there were problems with exact length of time and I think compliance. In any case I gave her 6 weeks. I have reviewed her PAD above. 11/15; right first metatarsal head. Again protruding subcutaneous tissue but this does not have any adherence to surrounding skin. Undermining circumferentially. There is no palpable bone. Not grossly purulent. We have been using Iodoflex to try to get some adherence to clean off the surface but no real improvement. I do not think they actually had enough supplies listening to the patient. She completed the 6 weeks of Septra I gave her, but I am not sure about the compliance with the dates i.e. may have missed days taken 1 a day etc. The patient has a vascular follow-up with Dr. Allyson Sabal this coming Friday i.e. in 3 days. By my read of arterial evaluations I do not think she is felt to be a candidate for any further revascularization. The last angiogram was in July. Right SFA is occluded  she has severe tibial vessel disease. She continues to smoke now up to 10 cigarettes a day. She is not in any pain. Wound has not improved but is certainly not  worsened. I gave her 6 weeks of trimethoprim/sulfamethoxazole which she is completed in some fashion. 11/29 right first metatarsal head previous TMA. Wound actually looks somewhat better today still a lot of callus around the wound on debris on the wound surface. UNFORTUNATELY the patient missed her appointment with Dr. Allyson Sabal and is not rebooked apparently until sometime in February 12/13; right first metatarsal wound actually looks some smaller today. I did not debride this today. She is using Iodoflex.  When she came in last week she had a large abscess on her left anterior lower leg apparently after falling we send her to the ER to have this drained which was done. I cannot see any cultures x-ray was negative. She was apparently given 2 doses of IV antibiotics and discharged on Bactrim DS twice daily which she is still taking. As mentioned I cannot see any cultures. 12/29; culture of the abscess on the left leg I did last time showed Serratia. We gave her antibiotics. I note she was in the ER next day with bleeding which they controlled she is on anticoagulants.She comes in today to clinic and the area on her right plantar foot is miraculously very hyperkeratotic but healed 02/06/2023 Ms. 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 She reports a callus to the right first met head and on the lateral aspect of the left foot. She saw podiatry 3 days ago and had the callus debrided. There are no open wounds at this time. She follows up with podiatry in 2 weeks. Readmission: 04-19-2023 upon evaluation today patient appears to be doing well all things considered in regard to her wounds. She does have several wounds however on  her feet 1 on the left foot centrally along the lateral edge and then a wound on the right lower leg more posterior. On the right foot plantar distal at the transmetatarsal amputation site this is the worst. With that being said she tells me this has been going on for quite some time she has been seen by podiatry they have done some debridements in the past. With that being said based on what we are seeing I believe that the patient's primary issue on the right side is that she is having some issues here with her arterial flow. The ABI on the right is 0.57 on the left is 1.04. With that being said this reading on the right is getting necessitated further evaluation by vascular. She tells me she has been seen by Dr. Gery Pray in the past and he stated there was nothing further to be done for the right leg. With that being said I think that it would be good to have a second opinion with vascular on this. Patient does have a history of diabetes mellitus type 2, peripheral vascular disease, and hypertension. The previous ABIs were performed on January 11, 2023. 04-26-2023 upon evaluation today patient appears to be doing about the same in regard to her wounds. She still has not had the arterial study at this point. I think this is something needs to be done as quickly as possible. Fortunately I do not see any evidence of active infection locally or systemically which is great news. No fevers, chills, nausea, vomiting, or diarrhea. 7/24; the patient has not been here in about a month and a half. She has open wounds on the plantar right foot second metatarsal  head in the setting of a previous TMA, she has a new area on the left lateral foot and perhaps traumatic areas on the right anterior and left lateral lower legs. She has poorly controlled edema. The patient has known severe PAD has had multiple interventions in the right SFA. Known occlusive disease on the right which is severe her last arterial studies  were in February 2024 on the right her PTA could not be measured absent. She had an pressure of 60 at the dorsalis pedis giving her an ABI of 0.57 monophasic waveforms here. On the left her study was a lot better with an ABI 1.04 triphasic waveforms. The ABI might be falsely elevated on the left. I think at her last angiogram she had occluded right SFA popliteal and tibial vessels with the peroneal vessel being the only vessel feeding her foot via collaterals. She comes in this time in basic over-the-counter shoes Objective Constitutional Sitting or standing Blood Pressure is within target range for patient.. Pulse regular and within target range for patient.[name] Respirations regular, non-labored and within target range.. Temperature is normal and within the target range for the patient.[name] Appears in no distress. Vitals Time Taken: 3:00 PM, Height: 64 in, Weight: 225 lbs, BMI: 38.6, Temperature: 99.1 F, Pulse: 79 bpm, Respiratory Rate: 20 breaths/min, Blood Pressure: 132/80 mmHg, Capillary Blood Glucose: 200 mg/dl. Cardiovascular Dorsalis pedis pulses were palpable bilaterally perhaps reduced. [name], [name] ([id number]) [id number] Page 10 of 12 General Notes: Wound exam; the patient has a deep punched-out area over what I think is the right second metatarsal head. Previous TMA on the right. On the left on the lateral foot at the base of the fifth metatarsal thick callus blister and some erythema I remove the skin and subcutaneous tissue revealing a quarter sized wound that does not have any depth. I do not think this is particularly infected On her upper legs she has traumatic wounds on the right anteriorly and the left laterally uncontrolled edema Integumentary (Hair, Skin) Wound #23 status is Open. Original cause of wound was Gradually Appeared. The date acquired was: 04/18/2021. The wound has been in treatment 7 weeks. The wound is located on the Right Metatarsal head  first. The wound measures 1.3cm length x 1.1cm width x 0.4cm depth; 1.123cm^2 area and 0.449cm^3 volume. There is Fat Layer (Subcutaneous Tissue) exposed. There is no tunneling or undermining noted. There is a medium amount of serosanguineous drainage noted. The wound margin is distinct with the outline attached to the wound base. There is small (1-33%) pink granulation within the wound bed. There is a large (67- 100%) amount of necrotic tissue within the wound bed including Adherent Slough. The periwound skin appearance had no abnormalities noted for moisture. The periwound skin appearance had no abnormalities noted for color. The periwound skin appearance exhibited: Callus. Periwound temperature was noted as No Abnormality. Wound #24 status is Open. Original cause of wound was Gradually Appeared. The date acquired was: 02/06/2023. The wound has been in treatment 7 weeks. The wound is located on the Left,Lateral Foot. The wound measures 3cm length x 2cm width x 0.4cm depth; 4.712cm^2 area and 1.885cm^3 volume. There is Fat Layer (Subcutaneous Tissue) exposed. There is no tunneling or undermining noted. There is a medium amount of serosanguineous drainage noted. The wound margin is distinct with the outline attached to the wound base. There is small (1-33%) pale granulation within the wound bed. There is a large (67-100%) amount of necrotic tissue within the wound bed  including Eschar and Adherent Slough. The periwound skin appearance had no abnormalities noted for moisture. The periwound skin appearance exhibited: Callus, Ecchymosis, Erythema. The periwound skin appearance did not exhibit: Crepitus, Excoriation, Induration, Rash, Scarring, Atrophie Blanche, Cyanosis, Hemosiderin Staining, Mottled, Pallor, Rubor. The surrounding wound skin color is noted with erythema which is circumferential. Periwound temperature was noted as No Abnormality. Wound #25 status is Healed - Epithelialized. Original cause  of wound was Gradually Appeared. The date acquired was: 04/03/2023. The wound has been in treatment 7 weeks. The wound is located on the Right,Posterior Lower Leg. The wound measures 0cm length x 0cm width x 0cm depth; 0cm^2 area and 0cm^3 volume. There is a none present amount of drainage noted. The wound margin is distinct with the outline attached to the wound base. There is no granulation within the wound bed. There is no necrotic tissue within the wound bed. The periwound skin appearance had no abnormalities noted for moisture. The periwound skin appearance did not exhibit: Callus, Crepitus, Excoriation, Induration, Rash, Scarring, Atrophie Blanche, Cyanosis, Ecchymosis, Hemosiderin Staining, Mottled, Pallor, Rubor, Erythema. Periwound temperature was noted as No Abnormality. Wound #26 status is Open. Original cause of wound was Trauma. The date acquired was: 06/03/2023. The wound is located on the Left,Lateral Lower Leg. The wound measures 1.2cm length x 1cm width x 0.1cm depth; 0.942cm^2 area and 0.094cm^3 volume. There is Fat Layer (Subcutaneous Tissue) exposed. There is no tunneling or undermining noted. There is a medium amount of serosanguineous drainage noted. The wound margin is distinct with the outline attached to the wound base. There is medium (34-66%) red, pink granulation within the wound bed. There is a medium (34-66%) amount of necrotic tissue within the wound bed including Adherent Slough. The periwound skin appearance exhibited: Hemosiderin Staining. The periwound skin appearance did not exhibit: Callus, Crepitus, Excoriation, Induration, Rash, Scarring, Dry/Scaly, Maceration, Atrophie Blanche, Cyanosis, Ecchymosis, Mottled, Pallor, Rubor, Erythema. Wound #27 status is Open. Original cause of wound was Trauma. The date acquired was: 06/03/2023. The wound is located on the Right,Anterior Lower Leg. The wound measures 3.5cm length x 2cm width x 0.1cm depth; 5.498cm^2 area and 0.55cm^3  volume. There is Fat Layer (Subcutaneous Tissue) exposed. There is no tunneling or undermining noted. There is a medium amount of serosanguineous drainage noted. The wound margin is distinct with the outline attached to the wound base. There is small (1-33%) red, pink granulation within the wound bed. There is a large (67-100%) amount of necrotic tissue within the wound bed including Adherent Slough. The periwound skin appearance exhibited: Hemosiderin Staining. The periwound skin appearance did not exhibit: Callus, Crepitus, Excoriation, Induration, Rash, Scarring, Dry/Scaly, Maceration, Atrophie Blanche, Cyanosis, Ecchymosis, Mottled, Pallor, Rubor, Erythema. 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 There was a Excisional Skin/Subcutaneous Tissue Debridement with a total area of 4.71 sq cm performed by Maxwell Caul., MD. With the following instrument(s): Blade to remove Viable and Non-Viable  tissue/material. Material removed includes Callus, Subcutaneous Tissue, Slough, Skin: Dermis, and Skin: Epidermis after achieving pain control using Lidocaine 4% Topical Solution. A time out was conducted at 15:20, prior to the start of the procedure. A Minimum amount of bleeding was controlled with Pressure. The procedure was tolerated well with a pain  level of 0 throughout and a pain level of 0 following the procedure. Post Debridement Measurements: 3cm length x 2cm width x 0.4cm depth; 1.885cm^3 volume. Character of Wound/Ulcer Post Debridement is improved. Severity of Tissue Post Debridement is: Fat layer exposed. Post procedure Diagnosis Wound #24: Same as Pre-Procedure Plan Follow-up Appointments: Return Appointment in 1 week. Allen Derry PA Room 9 Wednesday 315pm 06/14/2023 Return Appointment in 2 weeks. Allen Derry PA Room 9 Wednesday 315pm 06/21/2023 [name], [name] ([id number]) [phone].pdf Page 11 of 12 Anesthetic: (In clinic) Topical Lidocaine 5% applied to wound bed Bathing/ Shower/ Hygiene: May shower with protection but do not get wound dressing(s) wet. Protect dressing(s) with water repellant cover (for example, large plastic bag) or a cast cover and may then take shower. Edema Control - Lymphedema / SCD / Other: Elevate legs to the level of the heart or above for 30 minutes daily and/or when sitting for 3-4 times a day throughout the day. Avoid standing for long periods of time. Patient to wear own compression stockings every day. Exercise regularly - As tolerated Moisturize legs daily. Other Edema Control Orders/Instructions: - Recommend Compression stocking 15-71mm/Hg. Brochure for [employer] with printed leg measurements. WOUND #23: - Metatarsal head first Wound Laterality: Right Cleanser: Soap and Water 1 x Per Week/30 Days Discharge Instructions: May shower and wash wound with dial antibacterial soap and water prior to dressing change. Cleanser: Wound Cleanser 1 x Per Week/30 Days Discharge Instructions: Cleanse the wound with wound cleanser prior to applying a clean dressing using gauze sponges, not tissue or cotton balls. Peri-Wound Care: Sween Lotion (Moisturizing lotion) 1 x Per Week/30 Days Discharge Instructions: Apply moisturizing lotion as directed Prim Dressing: Maxorb Extra Ag+  Alginate Dressing, 4x4.75 (in/in) 1 x Per Week/30 Days ary Discharge Instructions: Apply to wound bed as instructed Secondary Dressing: Optifoam Non-Adhesive Dressing, 4x4 in 1 x Per Week/30 Days Discharge Instructions: Apply foam donut and gauze Com pression Wrap: Kerlix Roll 4.5x3.1 (in/yd) 1 x Per Week/30 Days Discharge Instructions: Apply Kerlix and Coban compression as directed. Com pression Wrap: Coban Self-Adherent Wrap 4x5 (in/yd) 1 x Per Week/30 Days Discharge Instructions: Apply over Kerlix as directed. WOUND #24: - Foot Wound Laterality: Left, Lateral Cleanser: Soap and Water 1 x Per Week/30 Days Discharge Instructions: May shower and wash wound with dial antibacterial soap and water prior to dressing change. Cleanser: Wound Cleanser 1 x Per Week/30 Days Discharge Instructions: Cleanse the wound with wound cleanser prior to applying a clean dressing using gauze sponges, not tissue or cotton balls. Peri-Wound Care: Sween Lotion (Moisturizing lotion) 1 x Per Week/30 Days Discharge Instructions: Apply moisturizing lotion as directed Prim Dressing: Maxorb Extra Ag+ Alginate Dressing, 4x4.75 (in/in) 1 x Per Week/30 Days ary Discharge Instructions: Apply to wound bed as instructed Secondary Dressing: Optifoam Non-Adhesive Dressing, 4x4 in 1 x Per Week/30 Days Discharge Instructions: Apply foam donut and gauze Com pression Wrap: Kerlix Roll 4.5x3.1 (in/yd) 1 x Per Week/30 Days Discharge Instructions: Apply Kerlix and Coban compression as directed. Com pression Wrap: Coban Self-Adherent Wrap 4x5 (in/yd) 1 x Per Week/30 Days Discharge Instructions: Apply over Kerlix as directed. WOUND #26: - Lower Leg Wound Laterality:  Left, Lateral Cleanser: Soap and Water 1 x Per Week/30 Days Discharge Instructions: May shower and wash wound with dial antibacterial soap and water prior to dressing change. Cleanser: Wound Cleanser 1 x Per Week/30 Days Discharge Instructions: Cleanse the wound with  wound cleanser prior to applying a clean dressing using gauze sponges, not tissue or cotton balls. Peri-Wound Care: Sween Lotion (Moisturizing lotion) 1 x Per Week/30 Days Discharge Instructions: Apply moisturizing lotion as directed Prim Dressing: Maxorb Extra Ag+ Alginate Dressing, 4x4.75 (in/in) 1 x Per Week/30 Days ary Discharge Instructions: Apply to wound bed as instructed Secondary Dressing: Woven Gauze Sponge, Non-Sterile 4x4 in 1 x Per Week/30 Days Discharge Instructions: Apply over primary dressing as directed. Com pression Wrap: Kerlix Roll 4.5x3.1 (in/yd) 1 x Per Week/30 Days Discharge Instructions: Apply Kerlix and Coban compression as directed. Com pression Wrap: Coban Self-Adherent Wrap 4x5 (in/yd) 1 x Per Week/30 Days Discharge Instructions: Apply over Kerlix as directed. WOUND #27: - Lower Leg Wound Laterality: Right, Anterior Cleanser: Soap and Water 1 x Per Week/30 Days Discharge Instructions: May shower and wash wound with dial antibacterial soap and water prior to dressing change. Cleanser: Wound Cleanser 1 x Per Week/30 Days Discharge Instructions: Cleanse the wound with wound cleanser prior to applying a clean dressing using gauze sponges, not tissue or cotton balls. Peri-Wound Care: Sween Lotion (Moisturizing lotion) 1 x Per Week/30 Days Discharge Instructions: Apply moisturizing lotion as directed Prim Dressing: Maxorb Extra Ag+ Alginate Dressing, 4x4.75 (in/in) 1 x Per Week/30 Days ary Discharge Instructions: Apply to wound bed as instructed Secondary Dressing: Woven Gauze Sponge, Non-Sterile 4x4 in 1 x Per Week/30 Days Discharge Instructions: Apply over primary dressing as directed. Com pression Wrap: Kerlix Roll 4.5x3.1 (in/yd) 1 x Per Week/30 Days Discharge Instructions: Apply Kerlix and Coban compression as directed. Com pression Wrap: Coban Self-Adherent Wrap 4x5 (in/yd) 1 x Per Week/30 Days Discharge Instructions: Apply over Kerlix as directed. 1. This is a  reasonably noncompliant patient who is still smoking. 2. She has absolutely severe PAD which is not going to have any revascularization options. Thishas already from Dr. Allyson Sabal 3. We put silver alginate on her wounds padded her wounds on her feet and gave her a kerlix Coban wrap. 4. She got the usual lecture about cigarette smoking 5. I have asked her to come back next week if she is continuously noncompliant I would consider discharging her from the clinic. I generally do not like to do this Electronic Signature(s) Signed: 06/07/2023 4:12:50 PM By: Baltazar Najjar MD Entered By: Baltazar Najjar on 06/07/2023 16:07:15 [name] ([id number]) [phone].pdf Page 12 of 12 -------------------------------------------------------------------------------- SuperBill Details Patient Name: Date of Service: [name] 06/07/2023 Medical Record Number: [id number] Patient Account Number: [id number] Date of Birth/Sex: Treating RN: 02/11/1961 (62 y.o. [name], [name] Primary Care Provider: [name] Other Clinician: Referring Provider: Treating Provider/Extender: Josephine Cables Weeks in Treatment: 7 Diagnosis Coding ICD-10 Codes Code Description 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.522 Non-pressure chronic ulcer of other part of left foot with fat layer exposed L97.512 Non-pressure chronic ulcer of other part of right foot with fat layer exposed E11.622 Type 2 diabetes mellitus with other skin ulcer L97.812 Non-pressure chronic ulcer of other part of right lower leg with fat layer exposed I73.89 Other specified peripheral vascular diseases I87.331 Chronic venous hypertension (idiopathic) with ulcer and inflammation of right lower extremity Facility Procedures : CPT4 Code: [id number] Description: 11042 - DEB SUBQ TISSUE 20 SQ  CM/< ICD-10 Diagnosis Description L97.522 Non-pressure chronic ulcer of other part of left foot with fat  layer exposed L97.512 Non-pressure chronic ulcer of other part of right foot with fat layer exposed  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 2 diabetes mellitus with foot ulcer Modifier: Quantity: 1 Physician Procedures : CPT4 Code Description Modifier [id number] 11042 - WC PHYS SUBQ TISS 20 SQ CM ICD-10 Diagnosis Description L97.522 Non-pressure chronic ulcer of other part of left foot with fat layer exposed L97.512 Non-pressure chronic ulcer of other part of right foot  with fat layer exposed 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 2 diabetes mellitus with foot ulcer Quantity: 1 Electronic Signature(s) Signed: 06/07/2023 4:12:50 PM By: Baltazar Najjar MD Entered By: Baltazar Najjar on 06/07/2023 16:07:33

## 2023-06-09 NOTE — Progress Notes (Signed)
[name], [name] ([id number]) [id number] Page 1 of 16 Visit Report for 06/07/2023 Arrival Information Details Patient Name: Date of Service: [name] 06/07/2023 1:30 PM Medical Record Number: [id number] Patient Account Number: [id number] Date of Birth/Sex: Treating RN: Nov 29, 1960 (62 y.o. [name], Millard.Loa Primary Care [name]: [name] Other Clinician: Referring [name]: Treating [name]/Extender: [name] in Treatment: 7 Visit Information History Since Last Visit Added or deleted any medications: No Patient Arrived: Dan Humphreys Any new allergies or adverse reactions: No Arrival Time: 15:01 Had a fall or experienced change in Yes Accompanied By: self activities of daily living that may affect Transfer Assistance: None risk of falls: Patient Identification Verified: Yes Signs or symptoms of abuse/neglect since last visito No Secondary Verification Process Completed: Yes Hospitalized since last visit: No Patient Requires Transmission-Based Precautions: No Implantable device outside of the clinic excluding No Patient Has Alerts: Yes cellular tissue based products placed in the center Patient Alerts: ABI R 0.57 (01/11/23) since last visit: ABI L 1.04 (01/11/23) Has Dressing in Place as Prescribed: No Has Compression in Place as Prescribed: No Has Footwear/Offloading in Place as Prescribed: No Price Present Now: No Notes patient late to her appt by an hour. Explained the importance of being on time to appts and coming to her appts. Electronic Signature(s) Signed: 06/07/2023 6:08:49 PM By: Karla Stall RN, BSN Entered By: [name] on 06/07/2023 15:01:58 -------------------------------------------------------------------------------- Encounter Discharge Information Details Patient Name: Date of Service: Karla Bigness, [name] 06/07/2023 1:30 PM Medical Record Number: [id number] Patient Account Number: [id number] Date of  Birth/Sex: Treating RN: May 12, 1961 (61 y.o. [name] Primary Care Shontavia Mickel: [name] Other Clinician: Referring [name]: Treating [name]/Extender: [name] in Treatment: 7 Encounter Discharge Information Items Post Procedure Vitals Discharge Condition: Stable Temperature (F): 99.1 Ambulatory Status: Walker Pulse (bpm): 79 Discharge Destination: Home Respiratory Rate (breaths/min): 20 Transportation: Private Auto Blood Pressure (mmHg): 132/80 Accompanied By: self Schedule Follow-up Appointment: Yes Clinical Summary of Care: Electronic Signature(s) Signed: 06/07/2023 6:08:49 PM By: Karla Stall RN, BSN Entered By: [name] on 06/07/2023 15:35:52 [name] ([id number]) [id number] Page 2 of 16 -------------------------------------------------------------------------------- Lower Extremity Assessment Details Patient Name: Date of Service: [name]. 06/07/2023 1:30 PM Medical Record Number: [id number] Patient Account Number: [id number] Date of Birth/Sex: Treating RN: 15-Apr-1961 (62 y.o. [name], Millard.Loa Primary Care Carman Auxier: [name] Other Clinician: Referring Jarelle Ates: Treating Xaviera Flaten/Extender: [name] in Treatment: 7 Edema Assessment Assessed: [Left: Yes] [Right: Yes] Edema: [Left: Yes] [Right: Yes] Calf Left: Right: Point of Measurement: 30 cm From Medial Instep 43 cm 42 cm Ankle Left: Right: Point of Measurement: 9 cm From Medial Instep 23 cm 23 cm Knee To Floor Left: Right: From Medial Instep 40 cm 40 cm Vascular Assessment Pulses: Dorsalis Pedis Palpable: [Left:Yes] [Right:Yes] Extremity colors, hair growth, and conditions: Extremity Color: [Left:Hyperpigmented] [Right:Hyperpigmented] Hair Growth on Extremity: [Left:No] [Right:No] Temperature of Extremity: [Left:Warm] [Right:Warm] Capillary Refill: [Left:< 3 seconds] [Right:< 3  seconds] Dependent Rubor: [Left:No] [Right:No] Blanched when Elevated: [Left:No Yes] [Right:No Yes] Toe Nail Assessment Left: Right: Thick: No Discolored: No Deformed: No Improper Length and Hygiene: No Electronic Signature(s) Signed: 06/07/2023 6:08:49 PM By: Karla Stall RN, BSN Entered By: [name] on 06/07/2023 16:07:20 -------------------------------------------------------------------------------- Multi Wound Chart Details Patient Name: Date of Service: Karla Bigness, [name] 06/07/2023 1:30 PM Medical Record Number: [id number] Patient Account Number: [id number] Date of Birth/Sex: Treating RN: Jun 02, 1961 (61 y.o. F) Primary  Care Ayson Cherubini: [name] Other Clinician: Referring Lydiann Bonifas: Treating Miri Jose/Extender: [name] in Treatment: 7 [name], [name] S ([id number]) [id number] Page 3 of 16 Vital Signs Height(in): 64 Capillary Blood Glucose(mg/dl): 409 Weight(lbs): 811 Pulse(bpm): 79 Body Mass Index(BMI): 38.6 Blood Pressure(mmHg): 132/80 Temperature(F): 99.1 Respiratory Rate(breaths/min): 20 [23:Photos:] Right Metatarsal head first Left, Lateral Foot Right, Posterior Lower Leg Wound Location: Gradually Appeared Gradually Appeared Gradually Appeared Wounding Event: Diabetic Wound/Ulcer of the 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 Primary Etiology: Extremity Extremity N/A N/A N/A Secondary Etiology: Chronic Obstructive Pulmonary Chronic Obstructive Pulmonary Chronic Obstructive Pulmonary Comorbid History: Disease (COPD), Sleep Apnea, Disease (COPD), Sleep Apnea, Disease (COPD), Sleep Apnea, Arrhythmia, Congestive Heart Failure, Arrhythmia, Congestive Heart Failure, Arrhythmia, Congestive Heart Failure, Coronary Artery Disease, Coronary Artery Disease, Coronary Artery Disease, Hypertension, Peripheral Arterial Hypertension, Peripheral Arterial Hypertension, Peripheral Arterial Disease,  Cirrhosis , Type II Diabetes, Disease, Cirrhosis , Type II Diabetes, Disease, Cirrhosis , Type II Diabetes, Osteoarthritis, Neuropathy Osteoarthritis, Neuropathy Osteoarthritis, Neuropathy 04/18/2021 02/06/2023 04/03/2023 Date Acquired: 7 7 7  Price of Treatment: Open Open Healed - Epithelialized Wound Status: No No No Wound Recurrence: No No No Clustered Wound: N/A N/A N/A Clustered Quantity: 1.3x1.1x0.4 3x2x0.4 0x0x0 Measurements L x W x D (cm) 1.123 4.712 0 A (cm) : rea 0.449 1.885 0 Volume (cm) : -19.20% -396.00% 100.00% % Reduction in A rea: 4.70% -1884.20% 100.00% % Reduction in Volume: Grade 2 Grade 1 Full Thickness Without Exposed Classification: Support Structures Medium Medium None Present Exudate A mount: Serosanguineous Serosanguineous N/A Exudate Type: red, brown red, brown N/A Exudate Color: Distinct, outline attached Distinct, outline attached Distinct, outline attached Wound Margin: Small (1-33%) Small (1-33%) None Present (0%) Granulation A mount: Pink Pale N/A Granulation Quality: Large (67-100%) Large (67-100%) None Present (0%) Necrotic A mount: Adherent Slough Eschar, Adherent Slough N/A Necrotic Tissue: Fat Layer (Subcutaneous Tissue): Yes Fat Layer (Subcutaneous Tissue): Yes Fascia: No Exposed Structures: Fascia: No Fascia: No Fat Layer (Subcutaneous Tissue): No Tendon: No Tendon: No Tendon: No Muscle: No Muscle: No Muscle: No Joint: No Joint: No Joint: No Bone: No Bone: No Bone: No Small (1-33%) None None Epithelialization: N/A Debridement - Excisional N/A Debridement: Pre-procedure Verification/Time Out N/A 15:20 N/A Taken: N/A Lidocaine 4% Topical Solution N/A Price Control: N/A Callus, Subcutaneous, Slough N/A Tissue Debrided: N/A Skin/Subcutaneous Tissue N/A Level: N/A 4.71 N/A Debridement A (sq cm): rea N/A Blade N/A Instrument: N/A Minimum N/A Bleeding: N/A Pressure N/A Hemostasis A chieved: N/A 0  N/A Procedural Price: N/A 0 N/A Post Procedural Price: N/A Procedure was tolerated well N/A Debridement Treatment Response: N/A 3x2x0.4 N/A Post Debridement Measurements L x W x D (cm) N/A 1.885 N/A Post Debridement Volume: (cm) Callus: Yes Callus: Yes Excoriation: No Periwound Skin Texture: Excoriation: No Induration: No Induration: No Callus: No Crepitus: No Crepitus: No Rash: No Rash: No Scarring: No Scarring: No No Abnormalities Noted Maceration: No Maceration: No Periwound Skin Moisture: Dry/Scaly: No Dry/Scaly: No [name], [name] ([id number]) [phone].pdf Page 4 of 16 No Abnormalities Noted Ecchymosis: Yes Atrophie Blanche: No Periwound Skin Color: Erythema: Yes Cyanosis: No Atrophie Blanche: No Ecchymosis: No Cyanosis: No Erythema: No Hemosiderin Staining: No Hemosiderin Staining: No Mottled: No Mottled: No Pallor: No Pallor: No Rubor: No Rubor: No N/A Circumferential N/A Erythema Location: No Abnormality No Abnormality No Abnormality Temperature: N/A Debridement N/A Procedures Performed: Wound Number: [id number] N/A Photos: N/A Left, Lateral Lower Leg Right, Anterior Lower Leg N/A Wound Location: Trauma Trauma N/A Wounding Event: Diabetic  Wound/Ulcer of the Lower Diabetic Wound/Ulcer of the Lower N/A Primary Etiology: Extremity Extremity Venous Leg Ulcer Venous Leg Ulcer N/A Secondary Etiology: Chronic Obstructive Pulmonary Chronic Obstructive Pulmonary N/A Comorbid History: Disease (COPD), Sleep Apnea, Disease (COPD), Sleep Apnea, Arrhythmia, Congestive Heart Failure, Arrhythmia, Congestive Heart Failure, Coronary Artery Disease, Coronary Artery Disease, Hypertension, Peripheral Arterial Hypertension, Peripheral Arterial Disease, Cirrhosis , Type II Diabetes, Disease, Cirrhosis , Type II Diabetes, Osteoarthritis, Neuropathy Osteoarthritis, Neuropathy 06/03/2023 06/03/2023 N/A Date A cquired: 0 0 N/A Price of  Treatment: Open Open N/A Wound Status: No No N/A Wound Recurrence: No Yes N/A Clustered Wound: N/A 3 N/A Clustered Quantity: 1.2x1x0.1 3.5x2x0.1 N/A Measurements L x W x D (cm) 0.942 5.498 N/A A (cm) : rea 0.094 0.55 N/A Volume (cm) : N/A N/A N/A % Reduction in A rea: N/A N/A N/A % Reduction in Volume: Grade 1 Grade 1 N/A Classification: Medium Medium N/A Exudate A mount: Serosanguineous Serosanguineous N/A Exudate Type: red, brown red, brown N/A Exudate Color: Distinct, outline attached Distinct, outline attached N/A Wound Margin: Medium (34-66%) Small (1-33%) N/A Granulation A mount: Red, Pink Red, Pink N/A Granulation Quality: Medium (34-66%) Large (67-100%) N/A Necrotic A mount: Adherent Slough Adherent Slough N/A Necrotic Tissue: Fat Layer (Subcutaneous Tissue): Yes Fat Layer (Subcutaneous Tissue): Yes N/A Exposed Structures: Fascia: No Fascia: No Tendon: No Tendon: No Muscle: No Muscle: No Joint: No Joint: No Bone: No Bone: No Small (1-33%) None N/A Epithelialization: N/A N/A N/A Debridement: N/A N/A N/A Price Control: N/A N/A N/A Tissue Debrided: N/A N/A N/A Level: N/A N/A N/A Debridement A (sq cm): rea N/A N/A N/A Instrument: N/A N/A N/A Bleeding: N/A N/A N/A Hemostasis A chieved: N/A N/A N/A Procedural Price: N/A N/A N/A Post Procedural Price: Debridement Treatment Response: N/A N/A N/A Post Debridement Measurements L x N/A N/A N/A W x D (cm) N/A N/A N/A Post Debridement Volume: (cm) Excoriation: No Excoriation: No N/A Periwound Skin Texture: Induration: No Induration: No Callus: No Callus: No Crepitus: No Crepitus: No Rash: No Rash: No Scarring: No Scarring: No Maceration: No Maceration: No N/A Periwound Skin Moisture: Dry/Scaly: No Dry/Scaly: No Hemosiderin Staining: Yes Hemosiderin Staining: Yes N/A Periwound Skin Color: Atrophie Blanche: No Atrophie Blanche: No Cyanosis: No Cyanosis: No [name], [name]  ([id number]) [phone].pdf Page 5 of 16 Ecchymosis: No Ecchymosis: No Erythema: No Erythema: No Mottled: No Mottled: No Pallor: No Pallor: No Rubor: No Rubor: No N/A N/A N/A Erythema Location: N/A N/A N/A Temperature: N/A N/A N/A Procedures Performed: Treatment Notes Wound #23 (Metatarsal head first) Wound Laterality: Right Cleanser Soap and Water Discharge Instruction: May shower and wash wound with dial antibacterial soap and water prior to dressing change. Wound Cleanser Discharge Instruction: Cleanse the wound with wound cleanser prior to applying a clean dressing using gauze sponges, not tissue or cotton balls. Peri-Wound Care Sween Lotion (Moisturizing lotion) Discharge Instruction: Apply moisturizing lotion as directed Topical Primary Dressing Maxorb Extra Ag+ Alginate Dressing, 4x4.75 (in/in) Discharge Instruction: Apply to wound bed as instructed Secondary Dressing Optifoam Non-Adhesive Dressing, 4x4 in Discharge Instruction: Apply foam donut and gauze Secured With Compression Wrap Kerlix Roll 4.5x3.1 (in/yd) Discharge Instruction: Apply Kerlix and Coban compression as directed. Coban Self-Adherent Wrap 4x5 (in/yd) Discharge Instruction: Apply over Kerlix as directed. Compression Stockings Add-Ons Wound #24 (Foot) Wound Laterality: Left, Lateral Cleanser Soap and Water Discharge Instruction: May shower and wash wound with dial antibacterial soap and water prior to dressing change. Wound Cleanser Discharge Instruction: Cleanse the wound with wound cleanser prior to  applying a clean dressing using gauze sponges, not tissue or cotton balls. Peri-Wound Care Sween Lotion (Moisturizing lotion) Discharge Instruction: Apply moisturizing lotion as directed Topical Primary Dressing Maxorb Extra Ag+ Alginate Dressing, 4x4.75 (in/in) Discharge Instruction: Apply to wound bed as instructed Secondary Dressing Optifoam Non-Adhesive Dressing,  4x4 in Discharge Instruction: Apply foam donut and gauze Secured With Compression Wrap Kerlix Roll 4.5x3.1 (in/yd) Discharge Instruction: Apply Kerlix and Coban compression as directed. Coban Self-Adherent Wrap 4x5 (in/yd) Discharge Instruction: Apply over Kerlix as directed. [name], [name] ([id number]) [id number] Page 6 of 16 Compression Stockings Add-Ons Wound #26 (Lower Leg) Wound Laterality: Left, Lateral Cleanser Soap and Water Discharge Instruction: May shower and wash wound with dial antibacterial soap and water prior to dressing change. Wound Cleanser Discharge Instruction: Cleanse the wound with wound cleanser prior to applying a clean dressing using gauze sponges, not tissue or cotton balls. Peri-Wound Care Sween Lotion (Moisturizing lotion) Discharge Instruction: Apply moisturizing lotion as directed Topical Primary Dressing Maxorb Extra Ag+ Alginate Dressing, 4x4.75 (in/in) Discharge Instruction: Apply to wound bed as instructed Secondary Dressing Woven Gauze Sponge, Non-Sterile 4x4 in Discharge Instruction: Apply over primary dressing as directed. Secured With Compression Wrap Kerlix Roll 4.5x3.1 (in/yd) Discharge Instruction: Apply Kerlix and Coban compression as directed. Coban Self-Adherent Wrap 4x5 (in/yd) Discharge Instruction: Apply over Kerlix as directed. Compression Stockings Add-Ons Wound #27 (Lower Leg) Wound Laterality: Right, Anterior Cleanser Soap and Water Discharge Instruction: May shower and wash wound with dial antibacterial soap and water prior to dressing change. Wound Cleanser Discharge Instruction: Cleanse the wound with wound cleanser prior to applying a clean dressing using gauze sponges, not tissue or cotton balls. Peri-Wound Care Sween Lotion (Moisturizing lotion) Discharge Instruction: Apply moisturizing lotion as directed Topical Primary Dressing Maxorb Extra Ag+ Alginate Dressing, 4x4.75  (in/in) Discharge Instruction: Apply to wound bed as instructed Secondary Dressing Woven Gauze Sponge, Non-Sterile 4x4 in Discharge Instruction: Apply over primary dressing as directed. Secured With Compression Wrap Kerlix Roll 4.5x3.1 (in/yd) Discharge Instruction: Apply Kerlix and Coban compression as directed. Coban Self-Adherent Wrap 4x5 (in/yd) Discharge Instruction: Apply over Kerlix as directed. Compression Stockings Add-Ons [name], [name] ([id number]) [id number].pdf Page 7 of 16 Electronic Signature(s) Signed: 06/07/2023 4:12:50 PM By: Baltazar Najjar MD Entered By: Baltazar Najjar on 06/07/2023 15:56:19 -------------------------------------------------------------------------------- Multi-Disciplinary Care Plan Details Patient Name: Date of Service: Karla Bigness, [name] 06/07/2023 1:30 PM Medical Record Number: [id number] Patient Account Number: [id number] Date of Birth/Sex: Treating RN: July 30, 1961 (61 y.o. [name] Primary Care Cardin Nitschke: [name] Other Clinician: Referring Lary Eckardt: Treating Gurnoor Sloop/Extender: [name] in Treatment: 7 Active Inactive Wound/Skin Impairment Nursing Diagnoses: Impaired tissue integrity Goals: Patient/caregiver will verbalize understanding of skin care regimen Date Initiated: 04/19/2023 Target Resolution Date: 08/19/2023 Goal Status: Active Interventions: Assess ulceration(s) every visit Treatment Activities: Skin care regimen initiated : 04/19/2023 Notes: Electronic Signature(s) Signed: 06/07/2023 6:08:49 PM By: Karla Stall RN, BSN Entered By: [name] on 06/07/2023 15:26:21 -------------------------------------------------------------------------------- Price Assessment Details Patient Name: Date of Service: Karla Bigness, [name] 06/07/2023 1:30 PM Medical Record Number: [id number] Patient Account Number: [id number] Date of Birth/Sex: Treating RN: 1961-08-08 (61 y.o. [name] Primary Care Tayla Panozzo: [name] Other Clinician: Referring Paydon Carll: Treating Heleena Miceli/Extender: [name] in Treatment: 7 Active Problems Location of Price Severity and Description of Price Patient Has Paino No Site Locations [place] ([id number]) [id number] Page 8 of 16 Price Management and Medication Current Price Management: Electronic Signature(s) Signed: 06/07/2023 6:08:49 PM  By: Karla Stall RN, BSN Entered By: [name] on 06/07/2023 15:02:20 -------------------------------------------------------------------------------- Patient/Caregiver Education Details Patient Name: Date of Service: Karla Paris Lore 7/24/2024andnbsp1:30 PM Medical Record Number: [id number] Patient Account Number: [id number] Date of Birth/Gender: Treating RN: May 04, 1961 (62 y.o. [name] Primary Care Physician: [name] Other Clinician: Referring Physician: Treating Physician/Extender: [name] in Treatment: 7 Education Assessment Education Provided To: Patient Education Topics Provided Wound/Skin Impairment: Handouts: Caring for Your Ulcer Methods: Explain/Verbal Responses: Reinforcements needed Electronic Signature(s) Signed: 06/07/2023 6:08:49 PM By: Karla Stall RN, BSN Entered By: [name] on 06/07/2023 15:26:34 -------------------------------------------------------------------------------- Wound Assessment Details Patient Name: Date of Service: Karla Bigness, [name] 06/07/2023 1:30 PM Medical Record Number: [id number] Patient Account Number: [id number] Date of Birth/Sex: Treating RN: 02-Sep-1961 (61 y.o. F) Primary Care Krisandra Bueno: [name] Other Clinician: Iven Price ([id number]) [id number] Page 9 of 16 Referring Beanca Kiester: Treating Majesta Leichter/Extender: [name] in Treatment: 7 Wound Status Wound  Number: 23 Primary Diabetic Wound/Ulcer of the Lower Extremity Etiology: Wound Location: Right Metatarsal head first Wound Open Wounding Event: Gradually Appeared Status: Date Acquired: 04/18/2021 Comorbid Chronic Obstructive Pulmonary Disease (COPD), Sleep Apnea, Price Of Treatment: 7 History: Arrhythmia, Congestive Heart Failure, Coronary Artery Disease, Clustered Wound: No Hypertension, Peripheral Arterial Disease, Cirrhosis , Type II Diabetes, Osteoarthritis, Neuropathy Photos Wound Measurements Length: (cm) 1.3 Width: (cm) 1.1 Depth: (cm) 0.4 Area: (cm) 1.123 Volume: (cm) 0.449 % Reduction in Area: -19.2% % Reduction in Volume: 4.7% Epithelialization: Small (1-33%) Tunneling: No Undermining: No Wound Description Classification: Grade 2 Wound Margin: Distinct, outline attached Exudate Amount: Medium Exudate Type: Serosanguineous Exudate Color: red, brown Foul Odor After Cleansing: No Slough/Fibrino Yes Wound Bed Granulation Amount: Small (1-33%) Exposed Structure Granulation Quality: Pink Fascia Exposed: No Necrotic Amount: Large (67-100%) Fat Layer (Subcutaneous Tissue) Exposed: Yes Necrotic Quality: Adherent Slough Tendon Exposed: No Muscle Exposed: No Joint Exposed: No Bone Exposed: No Periwound Skin Texture Texture Color No Abnormalities Noted: No No Abnormalities Noted: Yes Callus: Yes Temperature / Price Temperature: No Abnormality Moisture No Abnormalities Noted: Yes Treatment Notes Wound #23 (Metatarsal head first) Wound Laterality: Right Cleanser Soap and Water Discharge Instruction: May shower and wash wound with dial antibacterial soap and water prior to dressing change. Wound Cleanser Discharge Instruction: Cleanse the wound with wound cleanser prior to applying a clean dressing using gauze sponges, not tissue or cotton balls. Peri-Wound Care Sween Lotion (Moisturizing lotion) Discharge Instruction: Apply moisturizing lotion as  directed Topical ERCELL, DODDRIDGE ([id number]) [id number] Page 10 of 16 Primary Dressing Maxorb Extra Ag+ Alginate Dressing, 4x4.75 (in/in) Discharge Instruction: Apply to wound bed as instructed Secondary Dressing Optifoam Non-Adhesive Dressing, 4x4 in Discharge Instruction: Apply foam donut and gauze Secured With Compression Wrap Kerlix Roll 4.5x3.1 (in/yd) Discharge Instruction: Apply Kerlix and Coban compression as directed. Coban Self-Adherent Wrap 4x5 (in/yd) Discharge Instruction: Apply over Kerlix as directed. Compression Stockings Add-Ons Electronic Signature(s) Signed: 06/07/2023 6:08:49 PM By: Karla Stall RN, BSN Entered By: [name] on 06/07/2023 15:06:50 -------------------------------------------------------------------------------- Wound Assessment Details Patient Name: Date of Service: Karla Bigness, [name] 06/07/2023 1:30 PM Medical Record Number: [id number] Patient Account Number: [id number] Date of Birth/Sex: Treating RN: 1961/09/13 (61 y.o. F) Primary Care Katai Marsico: [name] Other Clinician: Referring Emri Sample: Treating Vayla Wilhelmi/Extender: [name] in Treatment: 7 Wound Status Wound Number: 24 Primary Diabetic Wound/Ulcer of the Lower Extremity Etiology: Wound Location: Left, Lateral Foot Wound Open Wounding Event: Gradually Appeared Status: Date Acquired: 02/06/2023  Comorbid Chronic Obstructive Pulmonary Disease (COPD), Sleep Apnea, Price Of Treatment: 7 History: Arrhythmia, Congestive Heart Failure, Coronary Artery Disease, Clustered Wound: No Hypertension, Peripheral Arterial Disease, Cirrhosis , Type II Diabetes, Osteoarthritis, Neuropathy Photos Wound Measurements Length: (cm) 3 Width: (cm) 2 Depth: (cm) 0.4 Area: (cm) 4.712 Volume: (cm) 1.885 % Reduction in Area: -396% % Reduction in Volume: -1884.2% Epithelialization: None Tunneling: No Undermining: No Wound  Description Classification: Grade 1 Wound Margin: Distinct, outline attached [place], Aleana S ([id number]) Exudate Amount: Medium Exudate Type: Serosanguineous Exudate Color: red, brown Foul Odor After Cleansing: No Slough/Fibrino Yes [phone].pdf Page 11 of 16 Wound Bed Granulation Amount: Small (1-33%) Exposed Structure Granulation Quality: Pale Fascia Exposed: No Necrotic Amount: Large (67-100%) Fat Layer (Subcutaneous Tissue) Exposed: Yes Necrotic Quality: Eschar, Adherent Slough Tendon Exposed: No Muscle Exposed: No Joint Exposed: No Bone Exposed: No Periwound Skin Texture Texture Color No Abnormalities Noted: No No Abnormalities Noted: No Callus: Yes Atrophie Blanche: No Crepitus: No Cyanosis: No Excoriation: No Ecchymosis: Yes Induration: No Erythema: Yes Rash: No Erythema Location: Circumferential Scarring: No Hemosiderin Staining: No Mottled: No Moisture Pallor: No No Abnormalities Noted: Yes Rubor: No Temperature / Price Temperature: No Abnormality Treatment Notes Wound #24 (Foot) Wound Laterality: Left, Lateral Cleanser Soap and Water Discharge Instruction: May shower and wash wound with dial antibacterial soap and water prior to dressing change. Wound Cleanser Discharge Instruction: Cleanse the wound with wound cleanser prior to applying a clean dressing using gauze sponges, not tissue or cotton balls. Peri-Wound Care Sween Lotion (Moisturizing lotion) Discharge Instruction: Apply moisturizing lotion as directed Topical Primary Dressing Maxorb Extra Ag+ Alginate Dressing, 4x4.75 (in/in) Discharge Instruction: Apply to wound bed as instructed Secondary Dressing Optifoam Non-Adhesive Dressing, 4x4 in Discharge Instruction: Apply foam donut and gauze Secured With Compression Wrap Kerlix Roll 4.5x3.1 (in/yd) Discharge Instruction: Apply Kerlix and Coban compression as directed. Coban Self-Adherent Wrap 4x5  (in/yd) Discharge Instruction: Apply over Kerlix as directed. Compression Stockings Add-Ons Electronic Signature(s) Signed: 06/07/2023 6:08:49 PM By: Karla Stall RN, BSN Entered By: [name] on 06/07/2023 15:07:20 [name] ([id number]) [id number] Page 12 of 16 -------------------------------------------------------------------------------- Wound Assessment Details Patient Name: Date of Service: [name]. 06/07/2023 1:30 PM Medical Record Number: [id number] Patient Account Number: [id number] Date of Birth/Sex: Treating RN: 25-Apr-1961 (62 y.o. F) Primary Care Amanie Mcculley: [name] Other Clinician: Referring Dilon Lank: Treating Callin Ashe/Extender: [name] in Treatment: 7 Wound Status Wound Number: 25 Primary Venous Leg Ulcer Etiology: Wound Location: Right, Posterior Lower Leg Wound Healed - Epithelialized Wounding Event: Gradually Appeared Status: Date Acquired: 04/03/2023 Comorbid Chronic Obstructive Pulmonary Disease (COPD), Sleep Apnea, Price Of Treatment: 7 History: Arrhythmia, Congestive Heart Failure, Coronary Artery Disease, Clustered Wound: No Hypertension, Peripheral Arterial Disease, Cirrhosis , Type II Diabetes, Osteoarthritis, Neuropathy Photos Wound Measurements Length: (cm) Width: (cm) Depth: (cm) Area: (cm) Volume: (cm) 0 % Reduction in Area: 100% 0 % Reduction in Volume: 100% 0 Epithelialization: None 0 0 Wound Description Classification: Full Thickness Without Exposed Support Wound Margin: Distinct, outline attached Exudate Amount: None Present Structures Foul Odor After Cleansing: No Slough/Fibrino No Wound Bed Granulation Amount: None Present (0%) Exposed Structure Necrotic Amount: None Present (0%) Fascia Exposed: No Fat Layer (Subcutaneous Tissue) Exposed: No Tendon Exposed: No Muscle Exposed: No Joint Exposed: No Bone Exposed: No Periwound Skin Texture Texture  Color No Abnormalities Noted: No No Abnormalities Noted: No Callus: No Atrophie Blanche: No Crepitus: No Cyanosis: No Excoriation: No Ecchymosis: No Induration: No Erythema: No Rash: No  Hemosiderin Staining: No Scarring: No Mottled: No Pallor: No Moisture Rubor: No No Abnormalities Noted: Yes Temperature / Price Temperature: No Abnormality [name], [name] ([id number]) [phone].pdf Page 13 of 16 Electronic Signature(s) Signed: 06/07/2023 6:08:49 PM By: Karla Stall RN, BSN Entered By: [name] on 06/07/2023 15:07:38 -------------------------------------------------------------------------------- Wound Assessment Details Patient Name: Date of Service: Karla [name], [name] 06/07/2023 1:30 PM Medical Record Number: [id number] Patient Account Number: [id number] Date of Birth/Sex: Treating RN: 1961-07-26 (61 y.o. [name], Millard.Loa Primary Care Abdulla Pooley: [name] Other Clinician: Referring Dajanae Brophy: Treating Jael Kostick/Extender: [name] in Treatment: 7 Wound Status Wound Number: 26 Primary Diabetic Wound/Ulcer of the Lower Extremity Etiology: Wound Location: Left, Lateral Lower Leg Secondary Venous Leg Ulcer Wounding Event: Trauma Etiology: Date Acquired: 06/03/2023 Wound Open Price Of Treatment: 0 Status: Clustered Wound: No Comorbid Chronic Obstructive Pulmonary Disease (COPD), Sleep Apnea, History: Arrhythmia, Congestive Heart Failure, Coronary Artery Disease, Hypertension, Peripheral Arterial Disease, Cirrhosis , Type II Diabetes, Osteoarthritis, Neuropathy Photos Wound Measurements Length: (cm) 1.2 Width: (cm) 1 Depth: (cm) 0.1 Area: (cm) 0.942 Volume: (cm) 0.094 % Reduction in Area: % Reduction in Volume: Epithelialization: Small (1-33%) Tunneling: No Undermining: No Wound Description Classification: Grade 1 Wound Margin: Distinct, outline attached Exudate Amount: Medium Exudate Type:  Serosanguineous Exudate Color: red, brown Foul Odor After Cleansing: No Slough/Fibrino Yes Wound Bed Granulation Amount: Medium (34-66%) Exposed Structure Granulation Quality: Red, Pink Fascia Exposed: No Necrotic Amount: Medium (34-66%) Fat Layer (Subcutaneous Tissue) Exposed: Yes Necrotic Quality: Adherent Slough Tendon Exposed: No Muscle Exposed: No Joint Exposed: No Bone Exposed: No Periwound Skin Texture Texture Color No Abnormalities Noted: No No Abnormalities Noted: No Callus: No Atrophie Blanche: No Crepitus: No Cyanosis: No [name], [name] ([id number]) [phone].pdf Page 14 of 16 Excoriation: No Ecchymosis: No Induration: No Erythema: No Rash: No Hemosiderin Staining: Yes Scarring: No Mottled: No Pallor: No Moisture Rubor: No No Abnormalities Noted: No Dry / Scaly: No Maceration: No Treatment Notes Wound #26 (Lower Leg) Wound Laterality: Left, Lateral Cleanser Soap and Water Discharge Instruction: May shower and wash wound with dial antibacterial soap and water prior to dressing change. Wound Cleanser Discharge Instruction: Cleanse the wound with wound cleanser prior to applying a clean dressing using gauze sponges, not tissue or cotton balls. Peri-Wound Care Sween Lotion (Moisturizing lotion) Discharge Instruction: Apply moisturizing lotion as directed Topical Primary Dressing Maxorb Extra Ag+ Alginate Dressing, 4x4.75 (in/in) Discharge Instruction: Apply to wound bed as instructed Secondary Dressing Woven Gauze Sponge, Non-Sterile 4x4 in Discharge Instruction: Apply over primary dressing as directed. Secured With Compression Wrap Kerlix Roll 4.5x3.1 (in/yd) Discharge Instruction: Apply Kerlix and Coban compression as directed. Coban Self-Adherent Wrap 4x5 (in/yd) Discharge Instruction: Apply over Kerlix as directed. Compression Stockings Add-Ons Electronic Signature(s) Signed: 06/07/2023 6:08:49 PM By: Karla Stall RN,  BSN Signed: 06/09/2023 11:46:38 AM By: Karl Ito Entered By: Karl Ito on 06/07/2023 15:06:44 -------------------------------------------------------------------------------- Wound Assessment Details Patient Name: Date of Service: Karla Bigness, [name] 06/07/2023 1:30 PM Medical Record Number: [id number] Patient Account Number: [id number] Date of Birth/Sex: Treating RN: May 30, 1961 (61 y.o. [name] Primary Care Tyliah Schlereth: [name] Other Clinician: Referring Ebonee Stober: Treating Jeffie Spivack/Extender: [name] in Treatment: 7 Wound Status Wound Number: 27 Primary Diabetic Wound/Ulcer of the Lower Extremity Etiology: Wound Location: Right, Anterior Lower Leg Secondary Venous Leg Ulcer Wounding Event: Trauma Etiology: Date Acquired: 06/03/2023 Wound Open Price Of Treatment: 0 Status: Clustered Wound: Yes Comorbid Chronic Obstructive Pulmonary Disease (COPD), Sleep Apnea, [name],  [name] ([id number]) [id number] Page 15 of 16 History: Arrhythmia, Congestive Heart Failure, Coronary Artery Disease, Hypertension, Peripheral Arterial Disease, Cirrhosis , Type II Diabetes, Osteoarthritis, Neuropathy Photos Wound Measurements Length: (cm) Width: (cm) Depth: (cm) Clustered Quantity: Area: (cm) Volume: (cm) 3.5 % Reduction in Area: 2 % Reduction in Volume: 0.1 Epithelialization: None 3 Tunneling: No 5.498 Undermining: No 0.55 Wound Description Classification: Grade 1 Wound Margin: Distinct, outline attached Exudate Amount: Medium Exudate Type: Serosanguineous Exudate Color: red, brown Foul Odor After Cleansing: No Slough/Fibrino Yes Wound Bed Granulation Amount: Small (1-33%) Exposed Structure Granulation Quality: Red, Pink Fascia Exposed: No Necrotic Amount: Large (67-100%) Fat Layer (Subcutaneous Tissue) Exposed: Yes Necrotic Quality: Adherent Slough Tendon Exposed: No Muscle Exposed: No Joint  Exposed: No Bone Exposed: No Periwound Skin Texture Texture Color No Abnormalities Noted: No No Abnormalities Noted: No Callus: No Atrophie Blanche: No Crepitus: No Cyanosis: No Excoriation: No Ecchymosis: No Induration: No Erythema: No Rash: No Hemosiderin Staining: Yes Scarring: No Mottled: No Pallor: No Moisture Rubor: No No Abnormalities Noted: No Dry / Scaly: No Maceration: No Treatment Notes Wound #27 (Lower Leg) Wound Laterality: Right, Anterior Cleanser Soap and Water Discharge Instruction: May shower and wash wound with dial antibacterial soap and water prior to dressing change. Wound Cleanser Discharge Instruction: Cleanse the wound with wound cleanser prior to applying a clean dressing using gauze sponges, not tissue or cotton balls. Peri-Wound Care Sween Lotion (Moisturizing lotion) Discharge Instruction: Apply moisturizing lotion as directed Topical Primary Dressing Maxorb Extra Ag+ Alginate Dressing, 4x4.75 (in/in) SANDARA, NEUKAM ([id number]) [id number] Page 16 of 16 Discharge Instruction: Apply to wound bed as instructed Secondary Dressing Woven Gauze Sponge, Non-Sterile 4x4 in Discharge Instruction: Apply over primary dressing as directed. Secured With Compression Wrap Kerlix Roll 4.5x3.1 (in/yd) Discharge Instruction: Apply Kerlix and Coban compression as directed. Coban Self-Adherent Wrap 4x5 (in/yd) Discharge Instruction: Apply over Kerlix as directed. Compression Stockings Add-Ons Electronic Signature(s) Signed: 06/07/2023 6:08:49 PM By: Karla Stall RN, BSN Signed: 06/09/2023 11:46:38 AM By: Karl Ito Entered By: Karl Ito on 06/07/2023 15:07:07 -------------------------------------------------------------------------------- Vitals Details Patient Name: Date of Service: Karla Bigness, [name] 06/07/2023 1:30 PM Medical Record Number: [id number] Patient Account Number: [id number] Date of Birth/Sex: Treating  RN: [birth year] (61 y.o. [name], Yvonne Kendall Primary Care Xitlally Mooneyham: [name] Other Clinician: Referring Etai Copado: Treating Coulton Schlink/Extender: [name] in Treatment: 7 Vital Signs Time Taken: 15:00 Temperature (F): 99.1 Height (in): 64 Pulse (bpm): 79 Weight (lbs): 225 Respiratory Rate (breaths/min): 20 Body Mass Index (BMI): 38.6 Blood Pressure (mmHg): 132/80 Capillary Blood Glucose (mg/dl): 638 Reference Range: 80 - 120 mg / dl Electronic Signature(s) Signed: 06/07/2023 6:08:49 PM By: Karla Stall RN, BSN Entered By: [name] on 06/07/2023 15:02:14

## 2023-06-14 ENCOUNTER — Encounter (HOSPITAL_BASED_OUTPATIENT_CLINIC_OR_DEPARTMENT_OTHER): Payer: Medicare HMO | Admitting: Physician Assistant

## 2023-06-14 DIAGNOSIS — I130 Hypertensive heart and chronic kidney disease with heart failure and stage 1 through stage 4 chronic kidney disease, or unspecified chronic kidney disease: Secondary | ICD-10-CM | POA: Diagnosis not present

## 2023-06-14 DIAGNOSIS — I87331 Chronic venous hypertension (idiopathic) with ulcer and inflammation of right lower extremity: Secondary | ICD-10-CM | POA: Diagnosis not present

## 2023-06-14 DIAGNOSIS — E1122 Type 2 diabetes mellitus with diabetic chronic kidney disease: Secondary | ICD-10-CM | POA: Diagnosis not present

## 2023-06-14 DIAGNOSIS — L97812 Non-pressure chronic ulcer of other part of right lower leg with fat layer exposed: Secondary | ICD-10-CM | POA: Diagnosis not present

## 2023-06-14 DIAGNOSIS — L97522 Non-pressure chronic ulcer of other part of left foot with fat layer exposed: Secondary | ICD-10-CM | POA: Diagnosis not present

## 2023-06-14 DIAGNOSIS — E11622 Type 2 diabetes mellitus with other skin ulcer: Secondary | ICD-10-CM | POA: Diagnosis not present

## 2023-06-14 DIAGNOSIS — L97512 Non-pressure chronic ulcer of other part of right foot with fat layer exposed: Secondary | ICD-10-CM | POA: Diagnosis not present

## 2023-06-14 DIAGNOSIS — E11621 Type 2 diabetes mellitus with foot ulcer: Secondary | ICD-10-CM | POA: Diagnosis not present

## 2023-06-14 DIAGNOSIS — I509 Heart failure, unspecified: Secondary | ICD-10-CM | POA: Diagnosis not present

## 2023-06-14 NOTE — Progress Notes (Signed)
Karla, Price ([id number]) [id number] Page 1 of 13 Visit Report for 06/14/2023 Chief Complaint Document Details Patient Name: Date of Service: [name] 06/14/2023 3:15 PM Medical Record Number: [id number] Patient Account Number: [id number] Date of Birth/Sex: Treating RN: [birth year] (62 y.o. F) Primary Care Provider: Hoy Price Other Clinician: Referring Provider: Treating Provider/Extender: [name], [name] Weeks in Treatment: 8 Information Obtained from: Patient Chief Complaint Bilateral LE Ulcers Electronic Signature(Price) Signed: 06/14/2023 3:38:56 PM By: Allen Derry PA-C Entered By: Allen Derry on 06/14/2023 15:38:56 -------------------------------------------------------------------------------- Debridement Details Patient Name: Date of Service: [name], [name]. 06/14/2023 3:15 PM Medical Record Number: [id number] Patient Account Number: [id number] Date of Birth/Sex: Treating RN: 04/03/1961 (61 y.o. [name] Primary Care Provider: Hoy Price Other Clinician: Referring Provider: Treating Provider/Extender: Juleen China Weeks in Treatment: 8 Debridement Performed for Assessment: Wound #24 Left,Lateral Foot Performed By: Physician Lenda Kelp, PA Debridement Type: Debridement Severity of Tissue Pre Debridement: Fat layer exposed Level of Consciousness (Pre-procedure): Awake and Alert Pre-procedure Verification/Time Out Yes - 16:04 Taken: Start Time: 16:04 Pain Control: Lidocaine 4% T opical Solution Percent of Wound Bed Debrided: 100% T Area Debrided (cm): otal 3.3 Tissue and other material debrided: Viable, Skin: Dermis , Skin: Epidermis Level: Skin/Epidermis Debridement Description: Selective/Open Wound Instrument: Curette Specimen: Swab, Number of Specimens T aken: 1 Bleeding: Minimum Hemostasis Achieved: Pressure End Time: 16:06 Procedural Pain: 0 Post Procedural Pain:  0 Response to Treatment: Procedure was tolerated well Level of Consciousness (Post- Awake and Alert procedure): Post Debridement Measurements of Total Wound Length: (cm) 2 Width: (cm) 2.1 Depth: (cm) 0.3 Price, [name] ([id number]) [id number] Page 2 of 13 Volume: (cm) 0.99 Character of Wound/Ulcer Post Debridement: Improved Severity of Tissue Post Debridement: Fat layer exposed Post Procedure Diagnosis Same as Pre-procedure Notes Scribed for Allen Derry III PA-C, by [employer]) Signed: 06/14/2023 4:54:39 PM By: Allen Derry PA-C Signed: 06/14/2023 6:06:03 PM By: Karie Schwalbe RN Entered By: Karie Schwalbe on 06/14/2023 16:09:59 -------------------------------------------------------------------------------- HPI Details Patient Name: Date of Service: [name], [name]. 06/14/2023 3:15 PM Medical Record Number: [id number] Patient Account Number: [id number] Date of Birth/Sex: Treating RN: November 18, 1960 (61 y.o. F) Primary Care Provider: Hoy Price Other Clinician: Referring Provider: Treating Provider/Extender: [name], [name] Weeks in Treatment: 8 History of Present Illness HPI Description: This 62 year old patient who has a very long significant history of diabetes mellitus, previous alcohol and nicotine abuse, chronic data disease, COPD, diabetes mellitus, height hypertension, critical lower limb ischemia with several wounds being managed at the wound Center at [facility] for over a year. She was recently in the ER at [facility] and was referred to our center. He has had a long history of critical limb ischemia and over a period of time has had balloon angioplasties in March 2016, endarterectomy of the left carotid, lower extremity angiogram and treatment by Dr. Nanetta Batty, several cardiac catheterizations. Most recently she had an x-ray of her left foot while in the ER which showed no acute  abnormality. during this ER visit she was started on ciprofloxacin and asked to continue with the wound care physicians at [facility]. Her last ABI done in June 2017 showed the right side to be 0.28 in the left side to be 0.48. Her right toe brachial index was 0.17 on the right and 0.27 on the left. her last hemoglobin A1c was 11.1% She continues to smoke about a pack  of cigarettes a day. 03/28/2017 -- -- right foot x-ray -- IMPRESSION:Areas of soft tissue swelling. Mild subluxation second PIP joint. No frank dislocation or fracture. No erosive change or bony destruction. No soft tissue ulceration or radiopaque foreign body evident by radiography. There is plantar fascia calcification with a nearby inferior calcaneal spur. There are foci of arterial vascular calcification. 04/04/2017 -- the patient continues to be noncompliant and continues to complain of a lot of pain and has not done anything about quitting smoking Readmission: 06/26/18 on evaluation today patient presents for reevaluation she has not been seen in our office since May 2018. Since she was last seen here in our office she has undergone testing at Dr. Durenda Age office where it was revealed that she had an ABI of 0.68 with her previous ABI being 0.64 and a left ABI of 0.38 with previous ABI being 0.34 this study which was performed on 04/05/18 was pretty much equivalent to the study performed on 11/22/17. The findings in the end revealed on the right would appear to be moderate right lower extremity arterial disease on the left Dr. Allyson Sabal states moderate in the report but unfortunately this appears to be much more severe at 0.38 compared to the right. Subsequently the patient was scheduled to have angiography with Dr. Allyson Sabal on 04/12/18. This was however canceled due to the fact that the patient was found to have chronic kidney disease stage III and it was to the point that he did not feel that it will be safe to pursue angiography at  that point. She has not been on any antibiotics recently. At this point Dr. Allyson Sabal has not rescheduled anything as far as the injured [place] according to the patient he is somewhat reluctant to Karla so. Nonetheless with her diminished blood flow this is gonna make it somewhat difficult for her to heal. 07/05/18; this is a patient I have not seen previously. She has very significant PAD as noted above. She apparently has had revascularization efforts by Dr. Allyson Sabal on the right on 3 occasions to the patient. She was supposed to have an attempted angiography on the left however this was canceled apparently because of stage III chronic renal failure. I will need to research all of this. She complains of significant pain in the wound and has claudication enough that when she walks to the end of the driveway she has to stop. She is been using silver alginate to the wounds on her legs and Iodoflex to the area on her left second toe. 07/13/18 on evaluation today patient'Price wounds actually appear to be doing about the same. She has an appointment she tells me within the next month that is September 2019 with Dr. Allyson Sabal to discuss options to see if there'Price anything else that you can recommend or Karla for her. Nonetheless obviously what we're trying to Karla is Karla what we can to save her leg and in turn prevent any additional worsening or damage. None in the meantime we been mainly trying to manage her ulcers as best we can. 07/27/18; some improvement in the multiplicity of wounds on her left lower extremity and foot. She'Price been using silver alginate. I was unable to determine that she actually has an appointment with Dr. Allyson Sabal. We are checking into this The patient'Price wound includes Left lateral foot, left plantar heel, her left anterior calf wound looks close to me, left fourth toe is very close to closed and the left medial calf is perhaps the largest  open area READMISSION 02/19/2021 [name], [name] ([id number])  [id number] Page 3 of 13 This is a now 62 year old woman with type 2 diabetes and continued cigarette smoking. She has known PAD. She was last in this clinic in 2019 at that point with wounds on her left foot. She left in a nonhealed state. On 06/28/2019 I see she had a left femoropopliteal by vein and vascular with an amputation of the fifth ray. These managed to heal over. Her last arterial studies were in February 2021. This showed an absent waveform at the posterior tibial artery on the right a dampened monophasic dorsalis pedis on the right of 0.44. On the left again the PTA TA was absent her dorsalis pedis was 0.99 triphasic and her great toe pressure was 0.65. This would have been after her revascularization. Once again she tells me that Dr. Allyson Sabal has done revascularization on the right leg on 3 different occasions. She has a right transmetatarsal amputation apparently done by Dr. Lajoyce Corners remotely but I Karla not see a note for these right lower extremity revascularization but I may not of looked back far enough. In any case she says that the she has a wound on the plantar aspect of the right transmetatarsal amputation site there is been there for several months. More recently she fell and had a wound on her right medial lower leg she had 5 sutures placed in the ER and then subsequently she has developed an area on the medial lower leg which was a blister that opened up. Past medical history includes type 2 diabetes, PAD, chronic renal failure, congestive heart failure, right transmetatarsal amputation, left fifth ray amputation, continued tobacco abuse, atrial fibrillation and cirrhosis. We did not attempt an ABI on the right leg today because of pain 02/26/2021; patient we admitted to the clinic last week. She has what looks to be 2 areas on her right medial and right anterior lower leg that look more like venous wounds but she has 1 on the first met head at the base of  her right transmet. She has known severe PAD. She complains of a lot of pain although some of this may be neuropathic. Is difficult to exclude a component of claudication. Use silver alginate on the wounds on her legs and Iodoflex on the area on the first met head. She has an appointment with Dr. Allyson Sabal on 5/25 although I will text him and discussed the situation. She is have poorly controlled diabetic. She has has stage IIIb chronic renal failure 4/22; patient presents for 1 week follow-up. The 2 areas on her right medial and right anterior lower leg appear well-healing. She has been using silver alginate to this area without issues. She has a first met head ulcer and it is unclear how long this has been there as it was discovered in the ED earlier this month when she was being evaluated for another issue. Iodoflex has been used at this area. 4/29; patient presents for 1 week follow-up. She has been using silver alginate to the leg wounds and Iodoflex to the plantar foot wound. She has had this wrapped with Coban and Kerlix. She has no concerns or complaints today. 5/6; this is a difficult wound on the right plantar foot transmit site. We are not making a lot of progress. She had 2 more venous looking wounds on the right medial and right anterior lower leg 1 of which is healed. Her appointment with Dr. Allyson Sabal is on 5/25 5/16; she did not  tolerate the offloading shoe we gave her in fact she had a fall with an abrasion on her right forearm she is now back in the modified small shoe which does not offload her foot properly. Her appoint with Dr. Allyson Sabal is still on 5/25 we have been using Iodoflex. The area on the right anterior lower leg is healed 7/18; patient presents for follow-up however has not been seen in 2 months. She was last seen at the end of May. She had a fall at the end of June and was hospitalized. She has been unable to follow-up since then. For the wound she has only been keeping it  covered with gauze. She is scheduled for an aortogram with lower extremity runoff at the end of July. 7/29; patient presents for 2-week follow-up. She has home health and they have been changing her dressings. She denies any issues and has no complaints today. She states she had a procedure where they opened up one of her vessels in her legs. She denies signs of infection. 8/5; patient presents for follow-up. She was recently in the hospital for bradycardia. She was noted to have cellulitis to the left lower extremity and Unna boot was placed due to blisters and increased swelling. She reports improvement to her left leg since being in the hospital and stability check her right plantar foot wound. She denies signs of infection. 8/23; since I last saw this patient a lot has happened. She still has the area over the right first met head in the setting of previous transmetatarsal amputation. Firstly most importantly she underwent revascularization of her occluded right SFA. She underwent directional atherectomy followed by drug-coated balloon angioplasty. She has no named vessel below the knee hopefully the revascularization of the right SFA will improve collateral flow. It is not felt however that she has any endovascular options. She had follow-up arterial studies noninvasive on 8/9. These showed an ABI of 0.44 at the right PTA. Monophasic waveforms. On the left her great toe ABI was 0.68. Her follow-up arterial Doppler showed a 50 to 74% stenosis in the proximal SFA and a 50 to 74% stenosis in the distal SFA mild to severe atherosclerosis noted throughout the extremity. Areas of shadowing plaque seen, unable to rule out higher grade stenosis she also had a fall apparently wearing a right foot forefoot off loader that we gave her. She therefore comes in in an ordinary running shoe today. Not certain if this is the fall that ended up in the hospital with bradycardia 9/6 area over the right first  metatarsal head in the setting of her previous amputation and severe PAD. She is also a diabetic with known PAD status post attempt at revascularization. She is continued smoker Again the separation of visits in the clinic is somewhat disturbing if we are going to consider her for a total contact cast. She is not wearing anything to offload this stating it causes imbalance especially the forefoot off loader. She basically comes in in bedroom slippers She also had a fall this morning about an hour ago. She has a skin tear on the left dorsal forearm 9/13; areas over the right first metatarsal head in the setting of previous TMA and severe PAD. Again she comes in here in slippers. We have been using Hydrofera Blue. We use MolecuLight on this which was essentially negative study 9/20; right first metatarsal head in the setting of her previous TMA and severe PAD. Same slipper type shoes. She says she cannot wear  a forefoot off loader and for some reason she will not wear surgical shoes. She says she is smoking half a pack per day. 9/30; right first metatarsal head. Absolutely no improvement in fact there is tunneling superiorly very close to bone. She does not offload this properly at all. We use MolecuLight on this 2 weeks ago that did not show any surface bacteria we have been using silver collagen is a dressing She tells me she is down to 3 cigarettes a day I have offered encouragement and a treatment plan 10/6; right first metatarsal head. Exposed bone this week which is not surprising. We have been using silver collagen. She is smoking 5 to 10 cigarettes a day 10/17; she has not been here in 10 days. The bone scraping that I did on 10/6 showed staph lugdunensis, staph epidermidis and Pseudomonas Alcalifenes. I put in for Septra DS 1 p.o. twice daily for 14 days last week would be but we could not reach her to start the antibiotic. Quinolones would have been a good alternative except she has multiple  drug interactions. Septra would not cover what ever the Pseudomonas is but I am not really sure of the relevance here. I am going to try her on the Septra for 2 weeks. She has a probing wound on the right TMA that probes to bone. She claims to have just about stop smoking I reviewed her arterial status. She underwent a left fem-tib bypass by Dr. Arbie Cookey in 06/28/2019. Her last angiogram was in July of this year by Dr. Allyson Sabal. This showed an occluded right SFA the popliteal and tibial vessels were also occluded the peroneal vessel filled by collaterals and filled the PT and DP by communicating collaterals. She was not felt to have any additional and vascular options. Dr. Allyson Sabal noted that she he had previously revascularized her right SFA 3 times. 10/25; she is tolerating the Septra but says it makes her nauseated. I am going to continue this for an additional 2 weeks perhaps for a total of 6 weeks. Her wound does not look too much different. There is on the right first met metatarsal head. There is probing areas around the tissue that is in the middle of the wound. There is no purulence I cannot feel bone. The original source was for a bone scraping. 11/8; right first metatarsal head. This does not probe to bone and there is no purulence. As far as I can tell she is completed the Septra although there were problems with exact length of time and I think compliance. In any case I gave her 6 weeks. I have reviewed her PAD above. 11/15; right first metatarsal head. Again protruding subcutaneous tissue but this does not have any adherence to surrounding skin. Undermining circumferentially. There is no palpable bone. Not grossly purulent. We have been using Iodoflex to try to get some adherence to clean off the surface but no real improvement. I Karla not think they actually had enough supplies listening to the patient. She completed the 6 weeks of Septra I gave her, but I am not sure about the compliance with the  dates i.e. may have missed days taken 1 a day etc. [name], [name] ([id number]) [id number] Page 4 of 13 The patient has a vascular follow-up with Dr. Allyson Sabal this coming Friday i.e. in 3 days. By my read of arterial evaluations I Karla not think she is felt to be a candidate for any further revascularization. The last angiogram was in July. Right  SFA is occluded she has severe tibial vessel disease. She continues to smoke now up to 10 cigarettes a day. She is not in any pain. Wound has not improved but is certainly not worsened. I gave her 6 weeks of trimethoprim/sulfamethoxazole which she is completed in some fashion. 11/29 right first metatarsal head previous TMA. Wound actually looks somewhat better today still a lot of callus around the wound on debris on the wound surface. UNFORTUNATELY the patient missed her appointment with Dr. Allyson Sabal and is not rebooked apparently until sometime in February 12/13; right first metatarsal wound actually looks some smaller today. I did not debride this today. She is using Iodoflex. When she came in last week she had a large abscess on her left anterior lower leg apparently after falling we send her to the ER to have this drained which was done. I cannot see any cultures x-ray was negative. She was apparently given 2 doses of IV antibiotics and discharged on Bactrim DS twice daily which she is still taking. As mentioned I cannot see any cultures. 12/29; culture of the abscess on the left leg I did last time showed Serratia. We gave her antibiotics. 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 She reports a  callus to the right first met head and on the lateral aspect of the left foot. She saw podiatry 3 days ago and had the callus debrided. There are no open wounds at this time. She follows up with podiatry in 2 weeks. Readmission: 04-19-2023 upon evaluation today patient appears to be doing well all things considered in regard to her wounds. She does have several wounds however on her feet 1 on the left foot centrally along the lateral edge and then a wound on the right lower leg more posterior. On the right foot plantar distal at the transmetatarsal amputation site this is the worst. With that being said she tells me this has been going on for quite some time she has been seen by podiatry they have done some debridements in the past. With that being said based on what we are seeing I believe that the patient'Price primary issue on the right side is that she is having some issues here with her arterial flow. The ABI on the right is 0.57 on the left is 1.04. With that being said this reading on the right is getting necessitated further evaluation by vascular. She tells me she has been seen by Dr. Gery Pray in the past and he stated there was nothing further to be done for the right leg. With that being said I think that it would be good to have a second opinion with vascular on this. Patient does have a history of diabetes mellitus type 2, peripheral vascular disease, and hypertension. The previous ABIs were performed on January 11, 2023. 04-26-2023 upon evaluation today patient appears to be doing about the same in regard to her wounds. She still has not had the arterial study at this point. I think this is something needs to be done as quickly as possible. Fortunately I Karla not see any evidence of active infection locally or systemically which is great  news. No fevers, chills, nausea, vomiting, or diarrhea. 7/24; the patient has not been here in about a month and a half. She has open wounds on the plantar right  foot second metatarsal head in the setting of a previous TMA, she has a new area on the left lateral foot and perhaps traumatic areas on the right anterior and left lateral lower legs. She has poorly controlled edema. The patient has known severe PAD has had multiple interventions in the right SFA. 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 She had an pressure of 60 at the dorsalis pedis giving her an ABI of 0.57 monophasic waveforms here. On the left her study was a lot better with an ABI 1.04 triphasic waveforms. The ABI might be falsely elevated on the left. I think at her last angiogram she had occluded right SFA popliteal and tibial vessels with the peroneal vessel being the only vessel feeding her foot via collaterals. She comes in this time in basic over-the-counter shoes 06-14-2023 upon evaluation today patient appears to be doing well currently in regard to her wounds all things considered except for the left lateral foot where she has what appears to be a blister this is erythematous as well and concerned about infection. I discussed with her today that I Karla think we need to see about doing the Vi core culture and subsequently depending on the results of this we will initiate antibiotic treatment as necessary. With that being said right now I think that she is going to need to be aggressive and elevating her legs she has an appointment with vascular coming up around 3 August is what I believe her appointment date is. Nonetheless once we get clearance from the standpoint of vascular she does have good blood flow and we can subsequently try to see what we Karla about getting her edema under much better control and then she really needs some much stronger compression than what we currently are able to Karla. Electronic Signature(Price) Signed: 06/14/2023 4:23:41 PM By: Allen Derry PA-C Entered By:  Allen Derry on 06/14/2023 16:23:41 -------------------------------------------------------------------------------- Physical Exam Details Patient Name: Date of Service: HA [name], [name]. 06/14/2023 3:15 PM Medical Record Number: [id number] Patient Account Number: [id number] Date of Birth/Sex: Treating RN: 02-18-1961 (62 y.o. F) Primary Care Provider: Hoy Price Other Clinician: Referring Provider: Treating Provider/Extender: [name], [name] Weeks in Treatment: 8 Constitutional Well-nourished and well-hydrated in no acute distress. Respiratory normal breathing without difficulty. YONEKO, SONDAG ([id number]) [id number] Page 5 of 13 Psychiatric this patient is able to make decisions and demonstrates good insight into disease process. Alert and Oriented x 3. pleasant and cooperative. Notes Upon inspection patient'Price wound bed actually showed signs of poor granulation and epithelization in general. I really feel like her wounds are about the same to be honest it nothing has changed much since I first saw her back in June and I had not seen her since mid June until this week although she did see Dr. Leanord Hawking last week. I did perform a light debridement on the left foot to clearway some of the callus and blister tissue so that I can get a culture from the underneath area after cleaning and we will see what this shows. Electronic Signature(Price) Signed: 06/14/2023 4:24:13 PM By: Allen Derry PA-C Entered By: Allen Derry on 06/14/2023 16:24:13 -------------------------------------------------------------------------------- Physician Orders Details Patient Name:  Date of Service: [name] 06/14/2023 3:15 PM Medical Record Number: [id number] Patient Account Number: [id number] Date of Birth/Sex: Treating RN: December 28, 1960 (62 y.o. [name] Primary Care Provider: Hoy Price Other Clinician: Referring Provider: Treating  Provider/Extender: [name], [name] Weeks in Treatment: 8 Verbal / Phone Orders: No Diagnosis Coding ICD-10 Coding Code Description 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.522 Non-pressure chronic ulcer of other part of left foot with fat layer exposed L97.512 Non-pressure chronic ulcer of other part of right foot with fat layer exposed E11.622 Type 2 diabetes mellitus with other skin ulcer L97.812 Non-pressure chronic ulcer of other part of right lower leg with fat layer exposed I73.89 Other specified peripheral vascular diseases I87.331 Chronic venous hypertension (idiopathic) with ulcer and inflammation of right lower extremity Follow-up Appointments ppointment in 1 week. Allen Derry PA Room 9 Return A Wednesday at 3:15pm on 06/21/2023 Anesthetic (In clinic) Topical Lidocaine 5% applied to wound bed Bathing/ Shower/ Hygiene May shower with protection but Karla not get wound dressing(Price) wet. Protect dressing(Price) with water repellant cover (for example, large plastic bag) or a cast cover and may then take shower. Edema Control - Lymphedema / SCD / Other Bilateral Lower Extremities Elevate legs to the level of the heart or above for 30 minutes daily and/or when sitting for 3-4 times a day throughout the day. Avoid standing for long periods of time. Patient to wear own compression stockings every day. Exercise regularly - As tolerated Moisturize legs daily. Other Edema Control Orders/Instructions: - Recommend Compression stocking 15-49mm/Hg. Brochure for Elastic Therapy with printed leg measurements. Wound Treatment Wound #23 - Metatarsal head first Wound Laterality: Right Cleanser: Soap and Water 1 x Per Week/30 Days Discharge Instructions: May shower and wash wound with dial antibacterial soap and water prior to dressing change. Cleanser: Wound Cleanser 1 x Per Week/30 Days Discharge Instructions: Cleanse the wound with wound cleanser prior to applying a  clean dressing using gauze sponges, not tissue or cotton balls. Peri-Wound Care: Sween Lotion (Moisturizing lotion) 1 x Per Week/30 Days Discharge Instructions: Apply moisturizing lotion as directed [name], [name] ([id number]) [id number] Page 6 of 13 Prim Dressing: Maxorb Extra Ag+ Alginate Dressing, 4x4.75 (in/in) 1 x Per Week/30 Days ary Discharge Instructions: Apply to wound bed as instructed Secondary Dressing: Optifoam Non-Adhesive Dressing, 4x4 in 1 x Per Week/30 Days Discharge Instructions: Apply foam donut and gauze Compression Wrap: Kerlix Roll 4.5x3.1 (in/yd) 1 x Per Week/30 Days Discharge Instructions: Apply Kerlix and Coban compression as directed. Compression Wrap: Coban Self-Adherent Wrap 4x5 (in/yd) 1 x Per Week/30 Days Discharge Instructions: Apply over Kerlix as directed. Wound #24 - Foot Wound Laterality: Left, Lateral Cleanser: Soap and Water 1 x Per Week/30 Days Discharge Instructions: May shower and wash wound with dial antibacterial soap and water prior to dressing change. Cleanser: Wound Cleanser 1 x Per Week/30 Days Discharge Instructions: Cleanse the wound with wound cleanser prior to applying a clean dressing using gauze sponges, not tissue or cotton balls. Peri-Wound Care: Sween Lotion (Moisturizing lotion) 1 x Per Week/30 Days Discharge Instructions: Apply moisturizing lotion as directed Prim Dressing: Maxorb Extra Ag+ Alginate Dressing, 4x4.75 (in/in) 1 x Per Week/30 Days ary Discharge Instructions: Apply to wound bed as instructed Secondary Dressing: Optifoam Non-Adhesive Dressing, 4x4 in 1 x Per Week/30 Days Discharge Instructions: Apply foam donut and gauze Compression Wrap: Kerlix Roll 4.5x3.1 (in/yd) 1 x Per Week/30 Days Discharge Instructions: Apply Kerlix and Coban compression as directed.  Compression Wrap: Coban Self-Adherent Wrap 4x5 (in/yd) 1 x Per Week/30 Days Discharge Instructions: Apply over Kerlix as directed. Wound  #26 - Lower Leg Wound Laterality: Left, Lateral Cleanser: Soap and Water 1 x Per Week/30 Days Discharge Instructions: May shower and wash wound with dial antibacterial soap and water prior to dressing change. Cleanser: Wound Cleanser 1 x Per Week/30 Days Discharge Instructions: Cleanse the wound with wound cleanser prior to applying a clean dressing using gauze sponges, not tissue or cotton balls. Peri-Wound Care: Sween Lotion (Moisturizing lotion) 1 x Per Week/30 Days Discharge Instructions: Apply moisturizing lotion as directed Prim Dressing: Maxorb Extra Ag+ Alginate Dressing, 4x4.75 (in/in) 1 x Per Week/30 Days ary Discharge Instructions: Apply to wound bed as instructed Secondary Dressing: Woven Gauze Sponge, Non-Sterile 4x4 in 1 x Per Week/30 Days Discharge Instructions: Apply over primary dressing as directed. Compression Wrap: Kerlix Roll 4.5x3.1 (in/yd) 1 x Per Week/30 Days Discharge Instructions: Apply Kerlix and Coban compression as directed. Compression Wrap: Coban Self-Adherent Wrap 4x5 (in/yd) 1 x Per Week/30 Days Discharge Instructions: Apply over Kerlix as directed. Wound #27 - Lower Leg Wound Laterality: Right, Anterior Cleanser: Soap and Water 1 x Per Week/30 Days Discharge Instructions: May shower and wash wound with dial antibacterial soap and water prior to dressing change. Cleanser: Wound Cleanser 1 x Per Week/30 Days Discharge Instructions: Cleanse the wound with wound cleanser prior to applying a clean dressing using gauze sponges, not tissue or cotton balls. Peri-Wound Care: Sween Lotion (Moisturizing lotion) 1 x Per Week/30 Days Discharge Instructions: Apply moisturizing lotion as directed Prim Dressing: Maxorb Extra Ag+ Alginate Dressing, 4x4.75 (in/in) 1 x Per Week/30 Days ary Discharge Instructions: Apply to wound bed as instructed Secondary Dressing: Woven Gauze Sponge, Non-Sterile 4x4 in 1 x Per Week/30 Days Discharge Instructions: Apply over primary dressing  as directed. Compression Wrap: Kerlix Roll 4.5x3.1 (in/yd) 1 x Per Week/30 Days Discharge Instructions: Apply Kerlix and Coban compression as directed. Compression Wrap: Coban Self-Adherent Wrap 4x5 (in/yd) 1 x Per Week/30 Days [name], [name] ([id number]) [id number] Page 7 of 13 Discharge Instructions: Apply over Kerlix as directed. Electronic Signature(Price) Signed: 06/14/2023 4:54:39 PM By: Allen Derry PA-C Signed: 06/14/2023 6:06:03 PM By: Karie Schwalbe RN Entered By: Karie Schwalbe on 06/14/2023 16:12:02 -------------------------------------------------------------------------------- Problem List Details Patient Name: Date of Service: [name], [name]. 06/14/2023 3:15 PM Medical Record Number: [id number] Patient Account Number: [id number] Date of Birth/Sex: Treating RN: 08/07/1961 (61 y.o. F) Primary Care Provider: Hoy Price Other Clinician: Referring Provider: Treating Provider/Extender: [name], [name] Weeks in Treatment: 8 Active Problems ICD-10 Encounter Code Description Active Date MDM Diagnosis 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 2 diabetes mellitus with foot ulcer 04/19/2023 No Yes L97.522 Non-pressure chronic ulcer of other part of left foot with fat layer exposed 04/19/2023 No Yes L97.512 Non-pressure chronic ulcer of other part of right foot with fat layer exposed 04/19/2023 No Yes E11.622 Type 2 diabetes mellitus with other skin ulcer 04/19/2023 No Yes L97.812 Non-pressure chronic ulcer of other part of right lower leg with fat layer 04/19/2023 No Yes exposed I73.89 Other specified peripheral vascular diseases 04/19/2023 No Yes I87.331 Chronic venous hypertension (idiopathic) with ulcer and inflammation of right 04/19/2023 No Yes lower extremity Inactive Problems Resolved Problems Electronic Signature(Price) Signed: 06/14/2023 3:38:50 PM By: Allen Derry PA-C Entered By: Allen Derry on 06/14/2023 15:38:50 [name] ([id number])  [id number] Page 8 of 13 -------------------------------------------------------------------------------- Progress Note Details Patient Name: Date of Service: HA LEY, [name]. 06/14/2023 3:15 PM Medical  Record Number: [id number] Patient Account Number: [id number] Date of Birth/Sex: Treating RN: Jun 24, 1961 (62 y.o. F) Primary Care Provider: Hoy Price Other Clinician: Referring Provider: Treating Provider/Extender: [name], [name] Weeks in Treatment: 8 Subjective Chief Complaint Information obtained from Patient Bilateral LE Ulcers History of Present Illness (HPI) This 62 year old patient who has a very long significant history of diabetes mellitus, previous alcohol and nicotine abuse, chronic data disease, COPD, diabetes mellitus, height hypertension, critical lower limb ischemia with several wounds being managed at the wound Center at [facility] for over a year. She was recently in the ER at [facility] and was referred to our center. He has had a long history of critical limb ischemia and over a period of time has had balloon angioplasties in March 2016, endarterectomy of the left carotid, lower extremity angiogram and treatment by Dr. Nanetta Batty, several cardiac catheterizations. Most recently she had an x-ray of her left foot while in the ER which showed no acute abnormality. during this ER visit she was started on ciprofloxacin and asked to continue with the wound care physicians at [facility]. Her last ABI done in June 2017 showed the right side to be 0.28 in the left side to be 0.48. Her right toe brachial index was 0.17 on the right and 0.27 on the left. her last hemoglobin A1c was 11.1% She continues to smoke about a pack of cigarettes a day. 03/28/2017 -- -- right foot x-ray -- IMPRESSION:Areas of soft tissue swelling. Mild subluxation second PIP joint. No frank dislocation or fracture. No erosive change or bony  destruction. No soft tissue ulceration or radiopaque foreign body evident by radiography. There is plantar fascia calcification with a nearby inferior calcaneal spur. There are foci of arterial vascular calcification. 04/04/2017 -- the patient continues to be noncompliant and continues to complain of a lot of pain and has not done anything about quitting smoking Readmission: 06/26/18 on evaluation today patient presents for reevaluation she has not been seen in our office since May 2018. Since she was last seen here in our office she has undergone testing at Dr. Durenda Age office where it was revealed that she had an ABI of 0.68 with her previous ABI being 0.64 and a left ABI of 0.38 with previous ABI being 0.34 this study which was performed on 04/05/18 was pretty much equivalent to the study performed on 11/22/17. The findings in the end revealed on the right would appear to be moderate right lower extremity arterial disease on the left Dr. Allyson Sabal states moderate in the report but unfortunately this appears to be much more severe at 0.38 compared to the right. Subsequently the patient was scheduled to have angiography with Dr. Allyson Sabal on 04/12/18. This was however canceled due to the fact that the patient was found to have chronic kidney disease stage III and it was to the point that he did not feel that it will be safe to pursue angiography at that point. She has not been on any antibiotics recently. At this point Dr. Allyson Sabal has not rescheduled anything as far as the injured [place] according to the patient he is somewhat reluctant to Karla so. Nonetheless with her diminished blood flow this is gonna make it somewhat difficult for her to heal. 07/05/18; this is a patient I have not seen previously. She has very significant PAD as noted above. She apparently has had revascularization efforts by Dr. Allyson Sabal on the right on 3 occasions to the patient. She was  supposed to have an attempted angiography on the left  however this was canceled apparently because of stage III chronic renal failure. I will need to research all of this. She complains of significant pain in the wound and has claudication enough that when she walks to the end of the driveway she has to stop. She is been using silver alginate to the wounds on her legs and Iodoflex to the area on her left second toe. 07/13/18 on evaluation today patient'Price wounds actually appear to be doing about the same. She has an appointment she tells me within the next month that is September 2019 with Dr. Allyson Sabal to discuss options to see if there'Price anything else that you can recommend or Karla for her. Nonetheless obviously what we're trying to Karla is Karla what we can to save her leg and in turn prevent any additional worsening or damage. None in the meantime we been mainly trying to manage her ulcers as best we can. 07/27/18; some improvement in the multiplicity of wounds on her left lower extremity and foot. She'Price been using silver alginate. I was unable to determine that she actually has an appointment with Dr. Allyson Sabal. We are checking into this The patient'Price wound includes Left lateral foot, left plantar heel, her left anterior calf wound looks close to me, left fourth toe is very close to closed and the left medial calf is perhaps the largest open area READMISSION 02/19/2021 This is a now 62 year old woman with type 2 diabetes and continued cigarette smoking. She has known PAD. She was last in this clinic in 2019 at that point with wounds on her left foot. She left in a nonhealed state. On 06/28/2019 I see she had a left femoropopliteal by vein and vascular with an amputation of the fifth ray. These managed to heal over. Her last arterial studies were in February 2021. This showed an absent waveform at the posterior tibial artery on the right a dampened monophasic dorsalis pedis on the right of 0.44. On the left again the PTA TA was absent her dorsalis pedis was 0.99  triphasic and her great toe pressure was 0.65. This would have been after her revascularization. Once again she tells me that Dr. Allyson Sabal has done revascularization on the right leg on 3 different occasions. She has a right transmetatarsal amputation apparently done by Dr. Lajoyce Corners remotely but I Karla not see a note for these right lower extremity revascularization but I may not of looked back far enough. In any case she says that the she has a wound on the plantar aspect of the right transmetatarsal amputation site there is been there for several months. More recently she fell and had a wound on her right medial lower leg she had 5 sutures placed in the ER and then subsequently she has developed an area on the medial lower leg which was a blister that opened up. Past medical history includes type 2 diabetes, PAD, chronic renal failure, congestive heart failure, right transmetatarsal amputation, left fifth ray amputation, continued tobacco abuse, atrial fibrillation and cirrhosis. We did not attempt an ABI on the right leg today because of pain 02/26/2021; patient we admitted to the clinic last week. She has what looks to be 2 areas on her right medial and right anterior lower leg that look more like [name], [name] ([id number]) [id number] Page 9 of 13 venous wounds but she has 1 on the first met head at the base of her right transmet. She has known severe  PAD. She complains of a lot of pain although some of this may be neuropathic. Is difficult to exclude a component of claudication. Use silver alginate on the wounds on her legs and Iodoflex on the area on the first met head. She has an appointment with Dr. Allyson Sabal on 5/25 although I will text him and discussed the situation. She is have poorly controlled diabetic. She has has stage IIIb chronic renal failure 4/22; patient presents for 1 week follow-up. The 2 areas on her right medial and right anterior lower leg appear well-healing.  She has been using silver alginate to this area without issues. She has a first met head ulcer and it is unclear how long this has been there as it was discovered in the ED earlier this month when she was being evaluated for another issue. Iodoflex has been used at this area. 4/29; patient presents for 1 week follow-up. She has been using silver alginate to the leg wounds and Iodoflex to the plantar foot wound. She has had this wrapped with Coban and Kerlix. She has no concerns or complaints today. 5/6; this is a difficult wound on the right plantar foot transmit site. We are not making a lot of progress. She had 2 more venous looking wounds on the right medial and right anterior lower leg 1 of which is healed. Her appointment with Dr. Allyson Sabal is on 5/25 5/16; she did not tolerate the offloading shoe we gave her in fact she had a fall with an abrasion on her right forearm she is now back in the modified small shoe which does not offload her foot properly. Her appoint with Dr. Allyson Sabal is still on 5/25 we have been using Iodoflex. The area on the right anterior lower leg is healed 7/18; patient presents for follow-up however has not been seen in 2 months. She was last seen at the end of May. She had a fall at the end of June and was hospitalized. She has been unable to follow-up since then. For the wound she has only been keeping it covered with gauze. She is scheduled for an aortogram with lower extremity runoff at the end of July. 7/29; patient presents for 2-week follow-up. She has home health and they have been changing her dressings. She denies any issues and has no complaints today. She states she had a procedure where they opened up one of her vessels in her legs. She denies signs of infection. 8/5; patient presents for follow-up. She was recently in the hospital for bradycardia. She was noted to have cellulitis to the left lower extremity and Unna boot was placed due to blisters and increased  swelling. She reports improvement to her left leg since being in the hospital and stability check her right plantar foot wound. She denies signs of infection. 8/23; since I last saw this patient a lot has happened. She still has the area over the right first met head in the setting of previous transmetatarsal amputation. Firstly most importantly she underwent revascularization of her occluded right SFA. She underwent directional atherectomy followed by drug-coated balloon angioplasty. She has no named vessel below the knee hopefully the revascularization of the right SFA will improve collateral flow. It is not felt however that she has any endovascular options. She had follow-up arterial studies noninvasive on 8/9. These showed an ABI of 0.44 at the right PTA. Monophasic waveforms. On the left her great toe ABI was 0.68. Her follow-up arterial Doppler showed a 50 to 74% stenosis in  the proximal SFA and a 50 to 74% stenosis in the distal SFA mild to severe atherosclerosis noted throughout the extremity. Areas of shadowing plaque seen, unable to rule out higher grade stenosis she also had a fall apparently wearing a right foot forefoot off loader that we gave her. She therefore comes in in an ordinary running shoe today. Not certain if this is the fall that ended up in the hospital with bradycardia 9/6 area over the right first metatarsal head in the setting of her previous amputation and severe PAD. She is also a diabetic with known PAD status post attempt at revascularization. She is continued smoker Again the separation of visits in the clinic is somewhat disturbing if we are going to consider her for a total contact cast. She is not wearing anything to offload this stating it causes imbalance especially the forefoot off loader. She basically comes in in bedroom slippers She also had a fall this morning about an hour ago. She has a skin tear on the left dorsal forearm 9/13; areas over the right  first metatarsal head in the setting of previous TMA and severe PAD. Again she comes in here in slippers. We have been using Hydrofera Blue. We use MolecuLight on this which was essentially negative study 9/20; right first metatarsal head in the setting of her previous TMA and severe PAD. Same slipper type shoes. She says she cannot wear a forefoot off loader and for some reason she will not wear surgical shoes. She says she is smoking half a pack per day. 9/30; right first metatarsal head. Absolutely no improvement in fact there is tunneling superiorly very close to bone. She does not offload this properly at all. We use MolecuLight on this 2 weeks ago that did not show any surface bacteria we have been using silver collagen is a dressing She tells me she is down to 3 cigarettes a day I have offered encouragement and a treatment plan 10/6; right first metatarsal head. Exposed bone this week which is not surprising. We have been using silver collagen. She is smoking 5 to 10 cigarettes a day 10/17; she has not been here in 10 days. The bone scraping that I did on 10/6 showed staph lugdunensis, staph epidermidis and Pseudomonas Alcalifenes. I put in for Septra DS 1 p.o. twice daily for 14 days last week would be but we could not reach her to start the antibiotic. Quinolones would have been a good alternative except she has multiple drug interactions. Septra would not cover what ever the Pseudomonas is but I am not really sure of the relevance here. I am going to try her on the Septra for 2 weeks. She has a probing wound on the right TMA that probes to bone. She claims to have just about stop smoking I reviewed her arterial status. She underwent a left fem-tib bypass by Dr. Arbie Cookey in 06/28/2019. Her last angiogram was in July of this year by Dr. Allyson Sabal. This showed an occluded right SFA the popliteal and tibial vessels were also occluded the peroneal vessel filled by collaterals and filled the PT and DP  by communicating collaterals. She was not felt to have any additional and vascular options. Dr. Allyson Sabal noted that she he had previously revascularized her right SFA 3 times. 10/25; she is tolerating the Septra but says it makes her nauseated. I am going to continue this for an additional 2 weeks perhaps for a total of 6 weeks. Her wound does not look too  much different. There is on the right first met metatarsal head. There is probing areas around the tissue that is in the middle of the wound. There is no purulence I cannot feel bone. The original source was for a bone scraping. 11/8; right first metatarsal head. This does not probe to bone and there is no purulence. As far as I can tell she is completed the Septra although there were problems with exact length of time and I think compliance. In any case I gave her 6 weeks. I have reviewed her PAD above. 11/15; right first metatarsal head. Again protruding subcutaneous tissue but this does not have any adherence to surrounding skin. Undermining circumferentially. There is no palpable bone. Not grossly purulent. We have been using Iodoflex to try to get some adherence to clean off the surface but no real improvement. I Karla not think they actually had enough supplies listening to the patient. She completed the 6 weeks of Septra I gave her, but I am not sure about the compliance with the dates i.e. may have missed days taken 1 a day etc. The patient has a vascular follow-up with Dr. Allyson Sabal this coming Friday i.e. in 3 days. By my read of arterial evaluations I Karla not think she is felt to be a candidate for any further revascularization. The last angiogram was in July. 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 She continues to smoke now up to 10 cigarettes a day. She is not in any pain. Wound has not improved but is certainly not worsened. I gave her 6 weeks of trimethoprim/sulfamethoxazole which she is completed in some fashion. 11/29  right first metatarsal head previous TMA. Wound actually looks somewhat better today still a lot of callus around the wound on debris on the wound surface. UNFORTUNATELY the patient missed her appointment with Dr. Allyson Sabal and is not rebooked apparently until sometime in February 12/13; right first metatarsal wound actually looks some smaller today. I did not debride this today. She is using Iodoflex.  When she came in last week she had a large abscess on her left anterior lower leg apparently after falling we send her to the ER to have this drained which was done. I cannot see any cultures x-ray was negative. She was apparently given 2 doses of IV antibiotics and discharged on Bactrim DS twice daily which she is still taking. As mentioned I cannot see any cultures. 12/29; culture of the abscess on the left leg I did last time showed Serratia. We gave her antibiotics. 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 She reports a callus to the right first met head and on the lateral aspect of the left foot. She saw podiatry 3 days ago and had the callus debrided. There are no open wounds at this time. She follows up with podiatry in 2 weeks. Readmission: 04-19-2023 upon evaluation today patient appears to be doing well all things considered in regard to her wounds. She does have several wounds however on her feet 1 on the left foot centrally along the lateral edge and then  a wound on the right lower leg more posterior. On the right foot plantar distal at the transmetatarsal amputation site this  is the worst. With that being said she tells me this has been going on for quite some time she has been seen by podiatry they have done some debridements in the past. With that being said based on what we are seeing I believe that the patient'Price primary issue on the right side is that she is having some issues here with her arterial flow. The ABI on the right is 0.57 on the left is 1.04. With that being said this reading on the right is getting necessitated further evaluation by vascular. She tells me she has been seen by Dr. Gery Pray in the past and he stated there was nothing further to be done for the right leg. With that being said I think that it would be good to have a second opinion with vascular on this. Patient does have a history of diabetes mellitus type 2, peripheral vascular disease, and hypertension. The previous ABIs were performed on January 11, 2023. 04-26-2023 upon evaluation today patient appears to be doing about the same in regard to her wounds. She still has not had the arterial study at this point. I think this is something needs to be done as quickly as possible. Fortunately I Karla not see any evidence of active infection locally or systemically which is great news. No fevers, chills, nausea, vomiting, or diarrhea. 7/24; the patient has not been here in about a month and a half. She has open wounds on the plantar right foot second metatarsal head in the setting of a previous TMA, she has a new area on the left lateral foot and perhaps traumatic areas on the right anterior and left lateral lower legs. She has poorly controlled edema. The patient has known severe PAD has had multiple interventions in the right SFA. 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 She had an pressure of 60 at the dorsalis pedis giving her an ABI of 0.57 monophasic waveforms here. On the left her study  was a lot better with an ABI 1.04 triphasic waveforms. The ABI might be falsely elevated on the left. I think at her last angiogram she had occluded right SFA popliteal and tibial vessels with the peroneal vessel being the only vessel feeding her foot via collaterals. She comes in this time in basic over-the-counter shoes 06-14-2023 upon evaluation today patient appears to be doing well currently in regard to her wounds all things considered except for the left lateral foot where she has what appears to be a blister this is erythematous as well and concerned about infection. I discussed with her today that I Karla think we need to see about doing the Vi core culture and subsequently depending on the results of this we will initiate antibiotic treatment as necessary. With that being said right now I think that she is going to need to be aggressive and elevating her legs she has an appointment with vascular coming up around 3 August is what I believe her appointment date is. Nonetheless once we get clearance from the standpoint of vascular she does have good blood flow and we can subsequently try to see what we Karla about getting her edema under much better control and then she really needs some much stronger compression than what we currently are  able to Karla. Objective Constitutional Well-nourished and well-hydrated in no acute distress. Vitals Time Taken: 3:23 PM, Height: 64 in, Weight: 225 lbs, BMI: 38.6, Temperature: 98.9 F, Pulse: 83 bpm, Respiratory Rate: 18 breaths/min, Blood Pressure: 143/78 mmHg. Respiratory normal breathing without difficulty. Psychiatric this patient is able to make decisions and demonstrates good insight into disease process. Alert and Oriented x 3. pleasant and cooperative. General Notes: Upon inspection patient'Price wound bed actually showed signs of poor granulation and epithelization in general. I really feel like her wounds are about the same to be honest it nothing has  changed much since I first saw her back in June and I had not seen her since mid June until this week although she did see Dr. Leanord Hawking last week. I did perform a light debridement on the left foot to clearway some of the callus and blister tissue so that I can get a culture from the underneath area after cleaning and we will see what this shows. Integumentary (Hair, Skin) Wound #23 status is Open. Original cause of wound was Gradually Appeared. The date acquired was: 04/18/2021. The wound has been in treatment 8 weeks. The wound is located on the Right Metatarsal head first. The wound measures 1cm length x 0.8cm width x 0.4cm depth; 0.628cm^2 area and 0.251cm^3 volume. There is Fat Layer (Subcutaneous Tissue) exposed. There is no tunneling or undermining noted. There is a medium amount of serosanguineous drainage noted. The wound margin is distinct with the outline attached to the wound base. There is medium (34-66%) pink granulation within the wound bed. There is a medium (34-66%) amount of necrotic tissue within the wound bed including Adherent Slough. The periwound skin appearance had no abnormalities noted for moisture. The periwound skin appearance had no abnormalities noted for color. The periwound skin appearance exhibited: Callus. Periwound temperature was noted as No Abnormality. Wound #24 status is Open. Original cause of wound was Gradually Appeared. The date acquired was: 02/06/2023. The wound has been in treatment 8 weeks. The wound is located on the Left,Lateral Foot. The wound measures 2cm length x 2.1cm width x 0.3cm depth; 3.299cm^2 area and 0.99cm^3 volume. There is Fat Layer (Subcutaneous Tissue) exposed. There is no tunneling or undermining noted. There is a medium amount of serosanguineous drainage noted. The wound margin is distinct with the outline attached to the wound base. There is small (1-33%) pale granulation within the wound bed. There is a large (67-100%) amount of necrotic  tissue within the wound bed including Eschar and Adherent Slough. The periwound skin appearance had no abnormalities noted for moisture. The periwound skin appearance exhibited: Callus, Ecchymosis, Erythema. The periwound skin appearance did not exhibit: Crepitus, Excoriation, Induration, Rash, Scarring, Atrophie Blanche, Cyanosis, Hemosiderin Staining, Mottled, Pallor, Rubor. The surrounding wound skin color is noted with erythema which is circumferential. Periwound temperature was noted as No Abnormality. Wound #26 status is Open. Original cause of wound was Trauma. The date acquired was: 06/03/2023. The wound has been in treatment 1 weeks. The wound is located on the Left,Lateral Lower Leg. The wound measures 1.5cm length x 1.2cm width x 0.1cm depth; 1.414cm^2 area and 0.141cm^3 volume. There is Fat Layer (Subcutaneous Tissue) exposed. There is no tunneling or undermining noted. There is a medium amount of serosanguineous drainage noted. The wound [name], [name] ([id number]) [id number] Page 11 of 13 margin is distinct with the outline attached to the wound base. There is medium (34-66%) red, pink granulation within the wound bed. There is a medium (34-66%)  amount of necrotic tissue within the wound bed including Adherent Slough. The periwound skin appearance exhibited: Hemosiderin Staining. The periwound skin appearance did not exhibit: Callus, Crepitus, Excoriation, Induration, Rash, Scarring, Dry/Scaly, Maceration, Atrophie Blanche, Cyanosis, Ecchymosis, Mottled, Pallor, Rubor, Erythema. Wound #27 status is Open. Original cause of wound was Trauma. The date acquired was: 06/03/2023. The wound has been in treatment 1 weeks. The wound is located on the Right,Anterior Lower Leg. The wound measures 2cm length x 3cm width x 0.1cm depth; 4.712cm^2 area and 0.471cm^3 volume. There is Fat Layer (Subcutaneous Tissue) exposed. There is no tunneling or undermining noted. There is a  medium amount of serosanguineous drainage noted. The wound margin is distinct with the outline attached to the wound base. There is small (1-33%) red, pink granulation within the wound bed. There is a large (67-100%) amount of necrotic tissue within the wound bed including Eschar and Adherent Slough. The periwound skin appearance exhibited: Hemosiderin Staining. The periwound skin appearance did not exhibit: Callus, Crepitus, Excoriation, Induration, Rash, Scarring, Dry/Scaly, Maceration, Atrophie Blanche, Cyanosis, Ecchymosis, Mottled, Pallor, Rubor, Erythema. 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 There was a Selective/Open Wound Skin/Epidermis Debridement with a total area of 3.3 sq cm performed by Lenda Kelp, PA. With the following instrument(Price): Curette to remove Viable tissue/material. Material removed includes Skin: Dermis and Skin: Epidermis and after achieving pain control using Lidocaine 4% T opical Solution. 1 specimen was taken by a Swab and sent to the lab per facility protocol. A time out was conducted at 16:04, prior to the start of the procedure. A Minimum amount of bleeding was controlled with Pressure. The procedure was tolerated well with a pain level of 0 throughout and a pain level of 0 following the procedure. Post  Debridement Measurements: 2cm length x 2.1cm width x 0.3cm depth; 0.99cm^3 volume. Character of Wound/Ulcer Post Debridement is improved. Severity of Tissue Post Debridement is: Fat layer exposed. Post procedure Diagnosis Wound #24: Same as Pre-Procedure General Notes: Scribed for [employer] PA-C, by J.Scotton. Plan Follow-up Appointments: Return Appointment in 1 week. Allen Derry PA Room 9 Wednesday at 3:15pm on 06/21/2023 Anesthetic: (In clinic) Topical Lidocaine 5% applied to wound bed Bathing/ Shower/ Hygiene: May shower with protection but Karla not get wound dressing(Price) wet. Protect dressing(Price) with water repellant cover (for example, large plastic bag) or a cast cover and may then take shower. Edema Control - Lymphedema / SCD / Other: Elevate legs to the level of the heart or above for 30 minutes daily and/or when sitting for 3-4 times a day throughout the day. Avoid standing for long periods of time. Patient to wear own compression stockings every day. Exercise regularly - As tolerated Moisturize legs daily. Other Edema Control Orders/Instructions: - Recommend Compression stocking 15-62mm/Hg. Brochure for [employer] with printed leg measurements. WOUND #23: - Metatarsal head first Wound Laterality: Right Cleanser: Soap and Water 1 x Per  Week/30 Days Discharge Instructions: May shower and wash wound with dial antibacterial soap and water prior to dressing change. Cleanser: Wound Cleanser 1 x Per Week/30 Days Discharge Instructions: Cleanse the wound with wound cleanser prior to applying a clean dressing using gauze sponges, not tissue or cotton balls. Peri-Wound Care: Sween Lotion (Moisturizing lotion) 1 x Per Week/30 Days Discharge Instructions: Apply moisturizing lotion as directed Prim Dressing: Maxorb Extra Ag+ Alginate Dressing, 4x4.75 (in/in) 1 x Per Week/30 Days ary Discharge Instructions: Apply to wound bed as instructed Secondary Dressing: Optifoam Non-Adhesive  Dressing, 4x4 in 1 x Per Week/30 Days Discharge Instructions: Apply foam donut and gauze Com pression Wrap: Kerlix Roll 4.5x3.1 (in/yd) 1 x Per Week/30 Days Discharge Instructions: Apply Kerlix and Coban compression as directed. Com pression Wrap: Coban Self-Adherent Wrap 4x5 (in/yd) 1 x Per Week/30 Days Discharge Instructions: Apply over Kerlix as directed. WOUND #24: - Foot Wound Laterality: Left, Lateral Cleanser: Soap and Water 1 x Per Week/30 Days Discharge Instructions: May shower and wash wound with dial antibacterial soap and water prior to dressing change. Cleanser: Wound Cleanser 1 x Per Week/30 Days Discharge Instructions: Cleanse the wound with wound cleanser prior to applying a clean dressing using gauze sponges, not tissue or cotton balls. Peri-Wound Care: Sween Lotion (Moisturizing lotion) 1 x Per Week/30 Days [name], HUE ([id number]) [id number] Page 12 of 13 Discharge Instructions: Apply moisturizing lotion as directed Prim Dressing: Maxorb Extra Ag+ Alginate Dressing, 4x4.75 (in/in) 1 x Per Week/30 Days ary Discharge Instructions: Apply to wound bed as instructed Secondary Dressing: Optifoam Non-Adhesive Dressing, 4x4 in 1 x Per Week/30 Days Discharge Instructions: Apply foam donut and gauze Com pression Wrap: Kerlix Roll 4.5x3.1 (in/yd) 1 x Per Week/30 Days Discharge Instructions: Apply Kerlix and Coban compression as directed. Com pression Wrap: Coban Self-Adherent Wrap 4x5 (in/yd) 1 x Per Week/30 Days Discharge Instructions: Apply over Kerlix as directed. WOUND #26: - Lower Leg Wound Laterality: Left, Lateral Cleanser: Soap and Water 1 x Per Week/30 Days Discharge Instructions: May shower and wash wound with dial antibacterial soap and water prior to dressing change. Cleanser: Wound Cleanser 1 x Per Week/30 Days Discharge Instructions: Cleanse the wound with wound cleanser prior to applying a clean dressing using gauze sponges, not tissue  or cotton balls. Peri-Wound Care: Sween Lotion (Moisturizing lotion) 1 x Per Week/30 Days Discharge Instructions: Apply moisturizing lotion as directed Prim Dressing: Maxorb Extra Ag+ Alginate Dressing, 4x4.75 (in/in) 1 x Per Week/30 Days ary Discharge Instructions: Apply to wound bed as instructed Secondary Dressing: Woven Gauze Sponge, Non-Sterile 4x4 in 1 x Per Week/30 Days Discharge Instructions: Apply over primary dressing as directed. Com pression Wrap: Kerlix Roll 4.5x3.1 (in/yd) 1 x Per Week/30 Days Discharge Instructions: Apply Kerlix and Coban compression as directed. Com pression Wrap: Coban Self-Adherent Wrap 4x5 (in/yd) 1 x Per Week/30 Days Discharge Instructions: Apply over Kerlix as directed. WOUND #27: - Lower Leg Wound Laterality: Right, Anterior Cleanser: Soap and Water 1 x Per Week/30 Days Discharge Instructions: May shower and wash wound with dial antibacterial soap and water prior to dressing change. Cleanser: Wound Cleanser 1 x Per Week/30 Days Discharge Instructions: Cleanse the wound with wound cleanser prior to applying a clean dressing using gauze sponges, not tissue or cotton balls. Peri-Wound Care: Sween Lotion (Moisturizing lotion) 1 x Per Week/30 Days Discharge Instructions: Apply moisturizing lotion as directed Prim Dressing: Maxorb Extra Ag+ Alginate Dressing, 4x4.75 (in/in) 1 x Per Week/30 Days ary Discharge Instructions: Apply to wound bed as instructed  Secondary Dressing: Woven Gauze Sponge, Non-Sterile 4x4 in 1 x Per Week/30 Days Discharge Instructions: Apply over primary dressing as directed. Com pression Wrap: Kerlix Roll 4.5x3.1 (in/yd) 1 x Per Week/30 Days Discharge Instructions: Apply Kerlix and Coban compression as directed. Com pression Wrap: Coban Self-Adherent Wrap 4x5 (in/yd) 1 x Per Week/30 Days Discharge Instructions: Apply over Kerlix as directed. 1. I am good recommend currently that we go ahead and perform a PCR culture. Depending on the  results of the culture will make any adjustments as necessary going forward with regard to the patient'Price treatment. 2. I am good recommend that we continue with the silver alginate for the time being. 3. And also can recommend that the patient should continue to monitor for any signs of infection or worsening overall. Obviously if anything changes she can contact the office and let me know. We will see patient back for reevaluation in 1 week here in the clinic. If anything worsens or changes patient will contact our office for additional recommendations. Electronic Signature(Price) Signed: 06/14/2023 4:31:50 PM By: Allen Derry PA-C Entered By: Allen Derry on 06/14/2023 16:31:49 -------------------------------------------------------------------------------- SuperBill Details Patient Name: Date of Service: [name], [name]. 06/14/2023 Medical Record Number: [id number] Patient Account Number: [id number] Date of Birth/Sex: Treating RN: 1961-03-14 (62 y.o. F) Primary Care Provider: Hoy Price Other Clinician: Referring Provider: Treating Provider/Extender: [name], [name] Weeks in Treatment: 8 Diagnosis Coding ICD-10 Codes Code Description 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.522 Non-pressure chronic ulcer of other part of left foot with fat layer exposed L97.512 Non-pressure chronic ulcer of other part of right foot with fat layer exposed E11.622 Type 2 diabetes mellitus with other skin ulcer [place], Carolynn Comment ([id number]) [id number] Page 13 of 13 [id number] Non-pressure chronic ulcer of other part of right lower leg with fat layer exposed I73.89 Other specified peripheral vascular diseases I87.331 Chronic venous hypertension (idiopathic) with ulcer and inflammation of right lower extremity Facility Procedures : CPT4 Code: [id number] Description: 97597 - DEBRIDE WOUND 1ST 20 SQ CM OR < ICD-10 Diagnosis Description L97.522 Non-pressure  chronic ulcer of other part of left foot with fat layer exposed Modifier: Quantity: 1 Physician Procedures : CPT4 Code Description Modifier [id number] - WC PHYS DEBR WO ANESTH 20 SQ CM ICD-10 Diagnosis Description L97.522 Non-pressure chronic ulcer of other part of left foot with fat layer exposed Quantity: 1 Electronic Signature(Price) Signed: 06/14/2023 4:36:16 PM By: Allen Derry PA-C Entered By: Allen Derry on 06/14/2023 16:36:15

## 2023-06-15 NOTE — Progress Notes (Signed)
ATHENE, HOLBERT ([id number]) [phone].pdf Page 1 of 11 Visit Report for 06/14/2023 Arrival Information Details Patient Name: Date of Service: [name] 06/14/2023 3:15 PM Medical Record Number: [id number] Patient Account Number: [id number] Date of Birth/Sex: Treating RN: [birth year] (62 y.o. [name] Primary Care Greidy Sherard: [name] Other Clinician: Referring Latonya Knight: Treating Demetria Iwai/Extender: [name], [name] Weeks in Treatment: 8 Visit Information History Since Last Visit Added or deleted any medications: No Patient Arrived: [name] Any new allergies or adverse reactions: No Arrival Time: 15:22 Had a fall or experienced change in No Accompanied By: sefl activities of daily living that may affect Transfer Assistance: None risk of falls: Patient Identification Verified: Yes Signs or symptoms of abuse/neglect since last visito No Patient Requires Transmission-Based Precautions: No Hospitalized since last visit: No Patient Has Alerts: Yes Implantable device outside of the clinic excluding No Patient Alerts: ABI R 0.57 (01/11/23) cellular tissue based products placed in the center ABI L 1.04 (01/11/23) since last visit: Has Dressing in Place as Prescribed: Yes Pain Present Now: Yes Electronic Signature(Price) Signed: 06/14/2023 6:06:03 PM By: Karie Schwalbe RN Entered By: Karie Schwalbe on 06/14/2023 15:23:25 -------------------------------------------------------------------------------- Encounter Discharge Information Details Patient Name: Date of Service: [name], Karla [name]. 06/14/2023 3:15 PM Medical Record Number: [id number] Patient Account Number: [id number] Date of Birth/Sex: Treating RN: 1961/01/29 (61 y.o. [name] Primary Care AmeLie Hollars: [name] Other Clinician: Referring Alexie Lanni: Treating Ralston Venus/Extender: Juleen China Weeks in Treatment: 8 Encounter Discharge Information  Items Post Procedure Vitals Discharge Condition: Stable Temperature (F): 98.9 Ambulatory Status: Walker Pulse (bpm): 83 Discharge Destination: Home Respiratory Rate (breaths/min): 18 Transportation: Private Auto Blood Pressure (mmHg): 143/78 Accompanied By: self Schedule Follow-up Appointment: Yes Clinical Summary of Care: Patient Declined Electronic Signature(Price) Signed: 06/14/2023 6:06:03 PM By: Karie Schwalbe RN Entered By: Karie Schwalbe on 06/14/2023 17:53:24 [name] ([id number]) [id number].pdf Page 2 of 11 -------------------------------------------------------------------------------- Lower Extremity Assessment Details Patient Name: Date of Service: [name] 06/14/2023 3:15 PM Medical Record Number: [id number] Patient Account Number: [id number] Date of Birth/Sex: Treating RN: [birth year] (62 y.o. [name] Primary Care Charistopher Rumble: [name] Other Clinician: Referring Jaquil Todt: Treating Dalary Hollar/Extender: [name], [name] Weeks in Treatment: 8 Edema Assessment Assessed: [Left: No] [Right: No] Edema: [Left: Yes] [Right: Yes] Calf Left: Right: Point of Measurement: 30 cm From Medial Instep 46 cm 46 cm Ankle Left: Right: Point of Measurement: 9 cm From Medial Instep 23 cm 23 cm Vascular Assessment Pulses: Dorsalis Pedis Palpable: [Left:Yes] [Right:Yes] Extremity colors, hair growth, and conditions: Extremity Color: [Left:Hyperpigmented] [Right:Hyperpigmented] Hair Growth on Extremity: [Left:No] [Right:No] Temperature of Extremity: [Left:Warm] [Right:Warm] Capillary Refill: [Left:< 3 seconds] [Right:< 3 seconds] Dependent Rubor: [Left:No Yes] [Right:No Yes] Toe Nail Assessment Left: Right: Thick: Yes Discolored: Yes Deformed: No Improper Length and Hygiene: No Electronic Signature(Price) Signed: 06/14/2023 6:06:03 PM By: Karie Schwalbe RN Entered By: Karie Schwalbe on 06/14/2023  15:26:05 -------------------------------------------------------------------------------- Multi-Disciplinary Care Plan Details Patient Name: Date of Service: [name], Karla [name]. 06/14/2023 3:15 PM Medical Record Number: [id number] Patient Account Number: [id number] Date of Birth/Sex: Treating RN: [birth year] (61 y.o. [name] Primary Care Alwin Lanigan: [name] Other Clinician: Referring Hayden Kihara: Treating Schuyler Olden/Extender: [name], [name] Weeks in Treatment: [address] [name], [name] ([id number]) [id number] Page 3 of 11 Wound/Skin Impairment Nursing Diagnoses: Impaired tissue integrity Goals: Patient/caregiver will verbalize understanding of skin care regimen Date Initiated: 04/19/2023 Target Resolution Date: 08/19/2023 Goal Status:  Active Interventions: Assess ulceration(Price) every visit Treatment Activities: Skin care regimen initiated : 04/19/2023 Notes: Electronic Signature(Price) Signed: 06/14/2023 6:06:03 PM By: Karie Schwalbe RN Entered By: Karie Schwalbe on 06/14/2023 17:52:11 -------------------------------------------------------------------------------- Pain Assessment Details Patient Name: Date of Service: Karla Hurter Price. 06/14/2023 3:15 PM Medical Record Number: [id number] Patient Account Number: [id number] Date of Birth/Sex: Treating RN: 25-Jun-1961 (61 y.o. [name] Primary Care Emrys Mceachron: [name] Other Clinician: Referring Alyss Granato: Treating Camilla Skeen/Extender: [name], [name] Weeks in Treatment: 8 Active Problems Location of Pain Severity and Description of Pain Patient Has Paino Yes Site Locations Pain Location: Generalized Pain With Dressing Change: No Duration of the Pain. Constant / Intermittento Constant Rate the pain. Current Pain Level: 10 Worst Pain Level: 10 Least Pain Level: 9 Tolerable Pain Level: 9 Character of Pain Describe the Pain: Difficult to  Pinpoint Pain Management and Medication Current Pain Management: Medication: Yes Cold Application: No Rest: Yes Massage: No Activity: No T.E.N.Price.: No Heat Application: No Leg drop or elevation: No Is the Current Pain Management Adequate: Adequate How does your wound impact your activities of daily livingo Sleep: No Bathing: No [name], [name] ([id number]) [phone].pdf Page 4 of 11 Appetite: No Relationship With Others: No Bladder Continence: No Emotions: No Bowel Continence: No Work: No Toileting: No Drive: No Dressing: No Hobbies: No Electronic Signature(Price) Signed: 06/14/2023 6:06:03 PM By: Karie Schwalbe RN Entered By: Karie Schwalbe on 06/14/2023 15:24:34 -------------------------------------------------------------------------------- Patient/Caregiver Education Details Patient Name: Date of Service: [name] 7/31/2024andnbsp3:15 PM Medical Record Number: [id number] Patient Account Number: [id number] Date of Birth/Gender: Treating RN: 27-Jul-1961 (62 y.o. [name] Primary Care Physician: [name] Other Clinician: Referring Physician: Treating Physician/Extender: Juleen China Weeks in Treatment: 8 Education Assessment Education Provided To: Patient Education Topics Provided Wound/Skin Impairment: Methods: Explain/Verbal Responses: State content correctly Electronic Signature(Price) Signed: 06/14/2023 6:06:03 PM By: Karie Schwalbe RN Entered By: Karie Schwalbe on 06/14/2023 17:52:27 -------------------------------------------------------------------------------- Wound Assessment Details Patient Name: Date of Service: Karla Hurter Price. 06/14/2023 3:15 PM Medical Record Number: [id number] Patient Account Number: [id number] Date of Birth/Sex: Treating RN: December 27, 1960 (61 y.o. F) Primary Care Baxter Gonzalez: [name] Other Clinician: Referring Miguel Christiana: Treating Emilynn Srinivasan/Extender: [name], [name] Weeks in Treatment: 8 Wound Status Wound Number: 23 Primary Diabetic Wound/Ulcer of the Lower Extremity Etiology: Wound Location: Right Metatarsal head first Wound Open Wounding Event: Gradually Appeared Status: Date Acquired: 04/18/2021 Comorbid Chronic Obstructive Pulmonary Disease (COPD), Sleep Apnea, Weeks Of Treatment: 8 History: Arrhythmia, Congestive Heart Failure, Coronary Artery Disease, Clustered Wound: No Hypertension, Peripheral Arterial Disease, Cirrhosis , Type II Diabetes, Osteoarthritis, Neuropathy Wound Measurements Length: (cm) 1 Width: (cm) 0.8 Depth: (cm) 0.4 Tippin, Magally Price ([id number]) Area: (cm) 0.628 Volume: (cm) 0.251 % Reduction in Area: 33.3% % Reduction in Volume: 46.7% Epithelialization: Small (1-33%) [phone].pdf Page 5 of 11 Tunneling: No Undermining: No Wound Description Classification: Grade 2 Wound Margin: Distinct, outline attached Exudate Amount: Medium Exudate Type: Serosanguineous Exudate Color: red, brown Foul Odor After Cleansing: No Slough/Fibrino Yes Wound Bed Granulation Amount: Medium (34-66%) Exposed Structure Granulation Quality: Pink Fascia Exposed: No Necrotic Amount: Medium (34-66%) Fat Layer (Subcutaneous Tissue) Exposed: Yes Necrotic Quality: Adherent Slough Tendon Exposed: No Muscle Exposed: No Joint Exposed: No Bone Exposed: No Periwound Skin Texture Texture Color No Abnormalities Noted: No No Abnormalities Noted: Yes Callus: Yes Temperature / Pain Temperature: No Abnormality Moisture No Abnormalities Noted: Yes Treatment Notes Wound #23 (Metatarsal head first) Wound Laterality:  Right Cleanser Soap and Water Discharge Instruction: May shower and wash wound with dial antibacterial soap and water prior to dressing change. Wound Cleanser Discharge Instruction: Cleanse the wound with wound cleanser prior to applying a clean dressing using gauze sponges, not tissue  or cotton balls. Peri-Wound Care Sween Lotion (Moisturizing lotion) Discharge Instruction: Apply moisturizing lotion as directed Topical Primary Dressing Maxorb Extra Ag+ Alginate Dressing, 4x4.75 (in/in) Discharge Instruction: Apply to wound bed as instructed Secondary Dressing Optifoam Non-Adhesive Dressing, 4x4 in Discharge Instruction: Apply foam donut and gauze Secured With Compression Wrap Kerlix Roll 4.5x3.1 (in/yd) Discharge Instruction: Apply Kerlix and Coban compression as directed. Coban Self-Adherent Wrap 4x5 (in/yd) Discharge Instruction: Apply over Kerlix as directed. Compression Stockings Add-Ons Electronic Signature(Price) Signed: 06/14/2023 6:06:03 PM By: Karie Schwalbe RN Entered By: Karie Schwalbe on 06/14/2023 15:29:56 [name] ([id number]) [id number] Page 6 of 11 -------------------------------------------------------------------------------- Wound Assessment Details Patient Name: Date of Service: [name] 06/14/2023 3:15 PM Medical Record Number: [id number] Patient Account Number: [id number] Date of Birth/Sex: Treating RN: 01-07-1961 (62 y.o. F) Primary Care Najah Liverman: [name] Other Clinician: Referring Le Ferraz: Treating Ayvion Kavanagh/Extender: [name], [name] Weeks in Treatment: 8 Wound Status Wound Number: 24 Primary Diabetic Wound/Ulcer of the Lower Extremity Etiology: Wound Location: Left, Lateral Foot Wound Open Wounding Event: Gradually Appeared Status: Date Acquired: 02/06/2023 Comorbid Chronic Obstructive Pulmonary Disease (COPD), Sleep Apnea, Weeks Of Treatment: 8 History: Arrhythmia, Congestive Heart Failure, Coronary Artery Disease, Clustered Wound: No Hypertension, Peripheral Arterial Disease, Cirrhosis , Type II Diabetes, Osteoarthritis, Neuropathy Photos Wound Measurements Length: (cm) 2 Width: (cm) 2. Depth: (cm) 0. Area: (cm) 3 Volume: (cm) 0 % Reduction in Area:  -247.3% 1 % Reduction in Volume: -942.1% 3 Epithelialization: Small (1-33%) .299 Tunneling: No .99 Undermining: No Wound Description Classification: Grade 1 Wound Margin: Distinct, outline attached Exudate Amount: Medium Exudate Type: Serosanguineous Exudate Color: red, brown Foul Odor After Cleansing: No Slough/Fibrino Yes Wound Bed Granulation Amount: Small (1-33%) Exposed Structure Granulation Quality: Pale Fascia Exposed: No Necrotic Amount: Large (67-100%) Fat Layer (Subcutaneous Tissue) Exposed: Yes Necrotic Quality: Eschar, Adherent Slough Tendon Exposed: No Muscle Exposed: No Joint Exposed: No Bone Exposed: No Periwound Skin Texture Texture Color No Abnormalities Noted: No No Abnormalities Noted: No Callus: Yes Atrophie Blanche: No Crepitus: No Cyanosis: No Excoriation: No Ecchymosis: Yes Induration: No Erythema: Yes Rash: No Erythema Location: Circumferential Scarring: No Hemosiderin Staining: No Mottled: No Moisture Pallor: No No Abnormalities Noted: Yes Rubor: No Temperature / Pain [name], [name] ([id number]) [phone].pdf Page 7 of 11 Temperature: No Abnormality Treatment Notes Wound #24 (Foot) Wound Laterality: Left, Lateral Cleanser Soap and Water Discharge Instruction: May shower and wash wound with dial antibacterial soap and water prior to dressing change. Wound Cleanser Discharge Instruction: Cleanse the wound with wound cleanser prior to applying a clean dressing using gauze sponges, not tissue or cotton balls. Peri-Wound Care Sween Lotion (Moisturizing lotion) Discharge Instruction: Apply moisturizing lotion as directed Topical Primary Dressing Maxorb Extra Ag+ Alginate Dressing, 4x4.75 (in/in) Discharge Instruction: Apply to wound bed as instructed Secondary Dressing Optifoam Non-Adhesive Dressing, 4x4 in Discharge Instruction: Apply foam donut and gauze Secured With Compression Wrap Kerlix Roll 4.5x3.1  (in/yd) Discharge Instruction: Apply Kerlix and Coban compression as directed. Coban Self-Adherent Wrap 4x5 (in/yd) Discharge Instruction: Apply over Kerlix as directed. Compression Stockings Add-Ons Electronic Signature(Price) Signed: 06/14/2023 6:06:03 PM By: Karie Schwalbe RN Entered By: Karie Schwalbe on 06/14/2023 15:30:39 -------------------------------------------------------------------------------- Wound Assessment Details Patient Name: Date of Service: Karla LEY,  Karla [name]. 06/14/2023 3:15 PM Medical Record Number: [id number] Patient Account Number: [id number] Date of Birth/Sex: Treating RN: 1961-10-25 (62 y.o. F) Primary Care Jakin Pavao: [name] Other Clinician: Referring Treanna Dumler: Treating Alyxandria Wentz/Extender: [name], [name] Weeks in Treatment: 8 Wound Status Wound Number: 26 Primary Diabetic Wound/Ulcer of the Lower Extremity Etiology: Wound Location: Left, Lateral Lower Leg Secondary Venous Leg Ulcer Wounding Event: Trauma Etiology: Date Acquired: 06/03/2023 Wound Open Weeks Of Treatment: 1 Status: Clustered Wound: No Comorbid Chronic Obstructive Pulmonary Disease (COPD), Sleep Apnea, History: Arrhythmia, Congestive Heart Failure, Coronary Artery Disease, Hypertension, Peripheral Arterial Disease, Cirrhosis , Type II Diabetes, Osteoarthritis, Neuropathy Photos [name], [name] ([id number]) [id number] Page 8 of 11 Wound Measurements Length: (cm) 1.5 Width: (cm) 1.2 Depth: (cm) 0.1 Area: (cm) 1.414 Volume: (cm) 0.141 % Reduction in Area: -50.1% % Reduction in Volume: -50% Epithelialization: Small (1-33%) Tunneling: No Undermining: No Wound Description Classification: Grade 1 Wound Margin: Distinct, outline attached Exudate Amount: Medium Exudate Type: Serosanguineous Exudate Color: red, brown Foul Odor After Cleansing: No Slough/Fibrino Yes Wound Bed Granulation Amount: Medium (34-66%) Exposed  Structure Granulation Quality: Red, Pink Fascia Exposed: No Necrotic Amount: Medium (34-66%) Fat Layer (Subcutaneous Tissue) Exposed: Yes Necrotic Quality: Adherent Slough Tendon Exposed: No Muscle Exposed: No Joint Exposed: No Bone Exposed: No Periwound Skin Texture Texture Color No Abnormalities Noted: No No Abnormalities Noted: No Callus: No Atrophie Blanche: No Crepitus: No Cyanosis: No Excoriation: No Ecchymosis: No Induration: No Erythema: No Rash: No Hemosiderin Staining: Yes Scarring: No Mottled: No Pallor: No Moisture Rubor: No No Abnormalities Noted: No Dry / Scaly: No Maceration: No Treatment Notes Wound #26 (Lower Leg) Wound Laterality: Left, Lateral Cleanser Soap and Water Discharge Instruction: May shower and wash wound with dial antibacterial soap and water prior to dressing change. Wound Cleanser Discharge Instruction: Cleanse the wound with wound cleanser prior to applying a clean dressing using gauze sponges, not tissue or cotton balls. Peri-Wound Care Sween Lotion (Moisturizing lotion) Discharge Instruction: Apply moisturizing lotion as directed Topical Primary Dressing Maxorb Extra Ag+ Alginate Dressing, 4x4.75 (in/in) Discharge Instruction: Apply to wound bed as instructed Secondary Dressing Woven Gauze Sponge, Non-Sterile 4x4 in Discharge Instruction: Apply over primary dressing as directed. [name], [name] ([id number]) [phone].pdf Page 9 of 11 Secured With Compression Wrap Kerlix Roll 4.5x3.1 (in/yd) Discharge Instruction: Apply Kerlix and Coban compression as directed. Coban Self-Adherent Wrap 4x5 (in/yd) Discharge Instruction: Apply over Kerlix as directed. Compression Stockings Add-Ons Electronic Signature(Price) Signed: 06/14/2023 6:06:03 PM By: Karie Schwalbe RN Entered By: Karie Schwalbe on 06/14/2023 15:32:22 -------------------------------------------------------------------------------- Wound  Assessment Details Patient Name: Date of Service: [name], Karla [name]. 06/14/2023 3:15 PM Medical Record Number: [id number] Patient Account Number: [id number] Date of Birth/Sex: Treating RN: 1961-06-02 (61 y.o. F) Primary Care Diamon Reddinger: [name] Other Clinician: Referring Panda Crossin: Treating Antwine Agosto/Extender: [name], [name] Weeks in Treatment: 8 Wound Status Wound Number: 27 Primary Diabetic Wound/Ulcer of the Lower Extremity Etiology: Wound Location: Right, Anterior Lower Leg Secondary Venous Leg Ulcer Wounding Event: Trauma Etiology: Date Acquired: 06/03/2023 Wound Open Weeks Of Treatment: 1 Status: Clustered Wound: Yes Comorbid Chronic Obstructive Pulmonary Disease (COPD), Sleep Apnea, History: Arrhythmia, Congestive Heart Failure, Coronary Artery Disease, Hypertension, Peripheral Arterial Disease, Cirrhosis , Type II Diabetes, Osteoarthritis, Neuropathy Photos Wound Measurements Length: (cm) Width: (cm) Depth: (cm) Clustered Quantity: Area: (cm) Volume: (cm) 2 % Reduction in Area: 14.3% 3 % Reduction in Volume: 14.4% 0.1 Epithelialization: Small (1-33%) 3 Tunneling: No 4.712 Undermining: No 0.471 Wound Description Classification:  Grade 1 Wound Margin: Distinct, outline attached Exudate Amount: Medium Exudate Type: Serosanguineous Exudate Color: red, brown [name], [name] ([id number]) Wound Bed Granulation Amount: Small (1-33%) Granulation Quality: Red, Pink Necrotic Amount: Large (67-100%) Necrotic Quality: Eschar, Adherent Slough Foul Odor After Cleansing: No Slough/Fibrino Yes [phone].pdf Page 10 of 11 Exposed Structure Fascia Exposed: No Fat Layer (Subcutaneous Tissue) Exposed: Yes Tendon Exposed: No Muscle Exposed: No Joint Exposed: No Bone Exposed: No Periwound Skin Texture Texture Color No Abnormalities Noted: No No Abnormalities Noted: No Callus: No Atrophie Blanche: No Crepitus: No Cyanosis:  No Excoriation: No Ecchymosis: No Induration: No Erythema: No Rash: No Hemosiderin Staining: Yes Scarring: No Mottled: No Pallor: No Moisture Rubor: No No Abnormalities Noted: No Dry / Scaly: No Maceration: No Treatment Notes Wound #27 (Lower Leg) Wound Laterality: Right, Anterior Cleanser Soap and Water Discharge Instruction: May shower and wash wound with dial antibacterial soap and water prior to dressing change. Wound Cleanser Discharge Instruction: Cleanse the wound with wound cleanser prior to applying a clean dressing using gauze sponges, not tissue or cotton balls. Peri-Wound Care Sween Lotion (Moisturizing lotion) Discharge Instruction: Apply moisturizing lotion as directed Topical Primary Dressing Maxorb Extra Ag+ Alginate Dressing, 4x4.75 (in/in) Discharge Instruction: Apply to wound bed as instructed Secondary Dressing Woven Gauze Sponge, Non-Sterile 4x4 in Discharge Instruction: Apply over primary dressing as directed. Secured With Compression Wrap Kerlix Roll 4.5x3.1 (in/yd) Discharge Instruction: Apply Kerlix and Coban compression as directed. Coban Self-Adherent Wrap 4x5 (in/yd) Discharge Instruction: Apply over Kerlix as directed. Compression Stockings Add-Ons Electronic Signature(Price) Signed: 06/14/2023 6:06:03 PM By: Karie Schwalbe RN Entered By: Karie Schwalbe on 06/14/2023 15:31:56 Vitals Details -------------------------------------------------------------------------------- [name] ([id number]) [id number] Page 11 of 11 Patient Name: Date of Service: [name] 06/14/2023 3:15 PM Medical Record Number: [id number] Patient Account Number: [id number] Date of Birth/Sex: Treating RN: 1961/04/11 (62 y.o. [name] Primary Care Christyann Manolis: [name] Other Clinician: Referring Treshawn Allen: Treating Tarae Wooden/Extender: [name], [name] Weeks in Treatment: 8 Vital Signs Time Taken:  15:23 Temperature (F): 98.9 Height (in): 64 Pulse (bpm): 83 Weight (lbs): 225 Respiratory Rate (breaths/min): 18 Body Mass Index (BMI): 38.6 Blood Pressure (mmHg): 143/78 Reference Range: 80 - 120 mg / dl Electronic Signature(Price) Signed: 06/14/2023 6:06:03 PM By: Karie Schwalbe RN Entered By: Karie Schwalbe on 06/14/2023 15:23:59

## 2023-06-16 ENCOUNTER — Ambulatory Visit
Admission: RE | Admit: 2023-06-16 | Discharge: 2023-06-16 | Disposition: A | Discharge status: Home / Self Care | Payer: Medicare HMO | Source: Ambulatory Visit | Attending: Student | Admitting: Student

## 2023-06-16 DIAGNOSIS — J449 Chronic obstructive pulmonary disease, unspecified: Secondary | ICD-10-CM

## 2023-06-21 ENCOUNTER — Encounter (HOSPITAL_BASED_OUTPATIENT_CLINIC_OR_DEPARTMENT_OTHER): Payer: Medicare HMO | Attending: Physician Assistant | Admitting: Physician Assistant

## 2023-06-21 DIAGNOSIS — N1832 Chronic kidney disease, stage 3b: Secondary | ICD-10-CM | POA: Diagnosis not present

## 2023-06-21 DIAGNOSIS — L97512 Non-pressure chronic ulcer of other part of right foot with fat layer exposed: Secondary | ICD-10-CM | POA: Insufficient documentation

## 2023-06-21 DIAGNOSIS — E11621 Type 2 diabetes mellitus with foot ulcer: Secondary | ICD-10-CM | POA: Diagnosis present

## 2023-06-21 DIAGNOSIS — F17210 Nicotine dependence, cigarettes, uncomplicated: Secondary | ICD-10-CM | POA: Insufficient documentation

## 2023-06-21 DIAGNOSIS — E1151 Type 2 diabetes mellitus with diabetic peripheral angiopathy without gangrene: Secondary | ICD-10-CM | POA: Insufficient documentation

## 2023-06-21 DIAGNOSIS — G4730 Sleep apnea, unspecified: Secondary | ICD-10-CM | POA: Diagnosis not present

## 2023-06-21 DIAGNOSIS — I509 Heart failure, unspecified: Secondary | ICD-10-CM | POA: Diagnosis not present

## 2023-06-21 DIAGNOSIS — L97812 Non-pressure chronic ulcer of other part of right lower leg with fat layer exposed: Secondary | ICD-10-CM | POA: Insufficient documentation

## 2023-06-21 DIAGNOSIS — L97522 Non-pressure chronic ulcer of other part of left foot with fat layer exposed: Secondary | ICD-10-CM | POA: Insufficient documentation

## 2023-06-21 DIAGNOSIS — K7460 Unspecified cirrhosis of liver: Secondary | ICD-10-CM | POA: Insufficient documentation

## 2023-06-21 DIAGNOSIS — E1140 Type 2 diabetes mellitus with diabetic neuropathy, unspecified: Secondary | ICD-10-CM | POA: Insufficient documentation

## 2023-06-21 DIAGNOSIS — I4891 Unspecified atrial fibrillation: Secondary | ICD-10-CM | POA: Diagnosis not present

## 2023-06-21 DIAGNOSIS — M1990 Unspecified osteoarthritis, unspecified site: Secondary | ICD-10-CM | POA: Diagnosis not present

## 2023-06-21 DIAGNOSIS — I130 Hypertensive heart and chronic kidney disease with heart failure and stage 1 through stage 4 chronic kidney disease, or unspecified chronic kidney disease: Secondary | ICD-10-CM | POA: Insufficient documentation

## 2023-06-21 DIAGNOSIS — I2510 Atherosclerotic heart disease of native coronary artery without angina pectoris: Secondary | ICD-10-CM | POA: Insufficient documentation

## 2023-06-21 DIAGNOSIS — E1122 Type 2 diabetes mellitus with diabetic chronic kidney disease: Secondary | ICD-10-CM | POA: Diagnosis not present

## 2023-06-21 DIAGNOSIS — E11622 Type 2 diabetes mellitus with other skin ulcer: Secondary | ICD-10-CM | POA: Diagnosis not present

## 2023-06-21 DIAGNOSIS — J449 Chronic obstructive pulmonary disease, unspecified: Secondary | ICD-10-CM | POA: Insufficient documentation

## 2023-06-22 NOTE — Progress Notes (Signed)
FOTINI, BARTOL ([id number]) [id number] Page 1 of 13 Visit Report for 06/21/2023 Chief Complaint Document Details Patient Name: Date of Service: [name] 06/21/2023 3:15 PM Medical Record Number: [id number] Patient Account Number: [id number] Date of Birth/Sex: Treating RN: January 13, 1961 (62 y.o. F) Primary Care Provider: [name] Other Clinician: Referring Provider: Treating Provider/Extender: [name], [name] Weeks in Treatment: 9 Information Obtained from: Patient Chief Complaint Bilateral LE Ulcers Electronic Signature(s) Signed: 06/21/2023 3:51:21 PM By: Allen Derry PA-C Entered By: Allen Derry on 06/21/2023 15:51:20 -------------------------------------------------------------------------------- Debridement Details Patient Name: Date of Service: [name], [name] 06/21/2023 3:15 PM Medical Record Number: [id number] Patient Account Number: [id number] Date of Birth/Sex: Treating RN: 1961/02/13 (61 y.o. F) Primary Care Provider: [name] Other Clinician: Referring Provider: Treating Provider/Extender: [name], [name] Weeks in Treatment: 9 Debridement Performed for Assessment: Wound #23 Right Metatarsal head first Performed By: Physician Lenda Kelp, PA Debridement Type: Debridement Severity of Tissue Pre Debridement: Fat layer exposed Level of Consciousness (Pre-procedure): Awake and Alert Pre-procedure Verification/Time Out Yes - 16:00 Taken: Start Time: 16:05 Pain Control: Lidocaine 4% T opical Solution Percent of Wound Bed Debrided: 100% T Area Debrided (cm): otal 0.49 Tissue and other material debrided: Viable, Non-Viable, Callus, Slough, Subcutaneous, Slough Level: Skin/Subcutaneous Tissue Debridement Description: Excisional Instrument: Curette Bleeding: Minimum Hemostasis Achieved: Pressure End Time: 16:05 Procedural Pain: 0 Post Procedural Pain: 0 Response to Treatment: Procedure was  tolerated well Level of Consciousness (Post- Awake and Alert procedure): Post Debridement Measurements of Total Wound Length: (cm) 0.9 Width: (cm) 0.7 Depth: (cm) 0.3 Volume: (cm) 0.148 Character of Wound/Ulcer Post Debridement: Improved Karla, Price ([id number]) [id number] Page 2 of 13 Severity of Tissue Post Debridement: Fat layer exposed Post Procedure Diagnosis Same as Pre-procedure Notes Scribed for Dr Leonard Schwartz by Brenton Grills RN. Electronic Signature(s) Signed: 06/21/2023 7:55:21 PM By: Allen Derry PA-C Signed: 06/22/2023 11:20:19 AM By: Karl Ito Entered By: Karl Ito on 06/21/2023 15:56:51 -------------------------------------------------------------------------------- HPI Details Patient Name: Date of Service: [name], [name] 06/21/2023 3:15 PM Medical Record Number: [id number] Patient Account Number: [id number] Date of Birth/Sex: Treating RN: Mar 13, 1961 (61 y.o. F) Primary Care Provider: [name] Other Clinician: Referring Provider: Treating Provider/Extender: [name], [name] Weeks in Treatment: 9 History of Present Illness HPI Description: This 62 year old patient who has a very long significant history of diabetes mellitus, previous alcohol and nicotine abuse, chronic data disease, COPD, diabetes mellitus, height hypertension, critical lower limb ischemia with several wounds being managed at the wound Center at [facility] for over a year. She was recently in the ER at [facility] and was referred to our center. He has had a long history of critical limb ischemia and over a period of time has had balloon angioplasties in March 2016, endarterectomy of the left carotid, lower extremity angiogram and treatment by Dr. Nanetta Batty, several cardiac catheterizations. Most recently she had an x-ray of her left foot while in the ER which showed no acute abnormality. during this ER visit she was started on  ciprofloxacin and asked to continue with the wound care physicians at [facility]. Her last ABI done in June 2017 showed the right side to be 0.28 in the left side to be 0.48. Her right toe brachial index was 0.17 on the right and 0.27 on the left. her last hemoglobin A1c was 11.1% She continues to smoke about a pack of cigarettes a day. 03/28/2017 -- -- right foot  x-ray -- IMPRESSION:Areas of soft tissue swelling. Mild subluxation second PIP joint. No frank dislocation or fracture. No erosive change or bony destruction. No soft tissue ulceration or radiopaque foreign body evident by radiography. There is plantar fascia calcification with a nearby inferior calcaneal spur. There are foci of arterial vascular calcification. 04/04/2017 -- the patient continues to be noncompliant and continues to complain of a lot of pain and has not done anything about quitting smoking Readmission: 06/26/18 on evaluation today patient presents for reevaluation she has not been seen in our office since May 2018. Since she was last seen here in our office she has undergone testing at Dr. Durenda Age office where it was revealed that she had an ABI of 0.68 with her previous ABI being 0.64 and a left ABI of 0.38 with previous ABI being 0.34 this study which was performed on 04/05/18 was pretty much equivalent to the study performed on 11/22/17. The findings in the end revealed on the right would appear to be moderate right lower extremity arterial disease on the left Dr. Allyson Sabal states moderate in the report but unfortunately this appears to be much more severe at 0.38 compared to the right. Subsequently the patient was scheduled to have angiography with Dr. Allyson Sabal on 04/12/18. This was however canceled due to the fact that the patient was found to have chronic kidney disease stage III and it was to the point that he did not feel that it will be safe to pursue angiography at that point. She has not been on any antibiotics  recently. At this point Dr. Allyson Sabal has not rescheduled anything as far as the injured Halley according to the patient he is somewhat reluctant to do so. Nonetheless with her diminished blood flow this is gonna make it somewhat difficult for her to heal. 07/05/18; this is a patient I have not seen previously. She has very significant PAD as noted above. She apparently has had revascularization efforts by Dr. Allyson Sabal on the right on 3 occasions to the patient. She was supposed to have an attempted angiography on the left however this was canceled apparently because of stage III chronic renal failure. I will need to research all of this. She complains of significant pain in the wound and has claudication enough that when she walks to the end of the driveway she has to stop. She is been using silver alginate to the wounds on her legs and Iodoflex to the area on her left second toe. 07/13/18 on evaluation today patient's wounds actually appear to be doing about the same. She has an appointment she tells me within the next month that is September 2019 with Dr. Allyson Sabal to discuss options to see if there's anything else that you can recommend or do for her. Nonetheless obviously what we're trying to do is do what we can to save her leg and in turn prevent any additional worsening or damage. None in the meantime we been mainly trying to manage her ulcers as best we can. 07/27/18; some improvement in the multiplicity of wounds on her left lower extremity and foot. She's been using silver alginate. I was unable to determine that she actually has an appointment with Dr. Allyson Sabal. We are checking into this The patient's wound includes Left lateral foot, left plantar heel, her left anterior calf wound looks close to me, left fourth toe is very close to closed and the left medial calf is perhaps the largest open area READMISSION 02/19/2021 This is a now 62 year old  woman with type 2 diabetes and continued cigarette smoking.  She has known PAD. She was last in this clinic in 2019 at that point with wounds on her left foot. She left in a nonhealed state. On 06/28/2019 I see she had a left femoropopliteal by vein and vascular with an amputation of the fifth CHYLER, GUERRANT ([id number]) [id number] Page 3 of 13 ray. These managed to heal over. Her last arterial studies were in February 2021. This showed an absent waveform at the posterior tibial artery on the right a dampened monophasic dorsalis pedis on the right of 0.44. On the left again the PTA TA was absent her dorsalis pedis was 0.99 triphasic and her great toe pressure was 0.65. This would have been after her revascularization. Once again she tells me that Dr. Allyson Sabal has done revascularization on the right leg on 3 different occasions. She has a right transmetatarsal amputation apparently done by Dr. Lajoyce Corners remotely but I do not see a note for these right lower extremity revascularization but I may not of looked back far enough. In any case she says that the she has a wound on the plantar aspect of the right transmetatarsal amputation site there is been there for several months. More recently she fell and had a wound on her right medial lower leg she had 5 sutures placed in the ER and then subsequently she has developed an area on the medial lower leg which was a blister that opened up. Past medical history includes type 2 diabetes, PAD, chronic renal failure, congestive heart failure, right transmetatarsal amputation, left fifth ray amputation, continued tobacco abuse, atrial fibrillation and cirrhosis. We did not attempt an ABI on the right leg today because of pain 02/26/2021; patient we admitted to the clinic last week. She has what looks to be 2 areas on her right medial and right anterior lower leg that look more like venous wounds but she has 1 on the first met head at the base of her right transmet. She has known severe PAD. She complains  of a lot of pain although some of this may be neuropathic. Is difficult to exclude a component of claudication. Use silver alginate on the wounds on her legs and Iodoflex on the area on the first met head. She has an appointment with Dr. Allyson Sabal on 5/25 although I will text him and discussed the situation. She is have poorly controlled diabetic. She has has stage IIIb chronic renal failure 4/22; patient presents for 1 week follow-up. The 2 areas on her right medial and right anterior lower leg appear well-healing. She has been using silver alginate to this area without issues. She has a first met head ulcer and it is unclear how long this has been there as it was discovered in the ED earlier this month when she was being evaluated for another issue. Iodoflex has been used at this area. 4/29; patient presents for 1 week follow-up. She has been using silver alginate to the leg wounds and Iodoflex to the plantar foot wound. She has had this wrapped with Coban and Kerlix. She has no concerns or complaints today. 5/6; this is a difficult wound on the right plantar foot transmit site. We are not making a lot of progress. She had 2 more venous looking wounds on the right medial and right anterior lower leg 1 of which is healed. Her appointment with Dr. Allyson Sabal is on 5/25 5/16; she did not tolerate the offloading shoe we gave her in fact  she had a fall with an abrasion on her right forearm she is now back in the modified small shoe which does not offload her foot properly. Her appoint with Dr. Allyson Sabal is still on 5/25 we have been using Iodoflex. The area on the right anterior lower leg is healed 7/18; patient presents for follow-up however has not been seen in 2 months. She was last seen at the end of May. She had a fall at the end of June and was hospitalized. She has been unable to follow-up since then. For the wound she has only been keeping it covered with gauze. She is scheduled for an aortogram with lower  extremity runoff at the end of July. 7/29; patient presents for 2-week follow-up. She has home health and they have been changing her dressings. She denies any issues and has no complaints today. She states she had a procedure where they opened up one of her vessels in her legs. She denies signs of infection. 8/5; patient presents for follow-up. She was recently in the hospital for bradycardia. She was noted to have cellulitis to the left lower extremity and Unna boot was placed due to blisters and increased swelling. She reports improvement to her left leg since being in the hospital and stability check her right plantar foot wound. She denies signs of infection. 8/23; since I last saw this patient a lot has happened. She still has the area over the right first met head in the setting of previous transmetatarsal amputation. Firstly most importantly she underwent revascularization of her occluded right SFA. She underwent directional atherectomy followed by drug-coated balloon angioplasty. She has no named vessel below the knee hopefully the revascularization of the right SFA will improve collateral flow. It is not felt however that she has any endovascular options. She had follow-up arterial studies noninvasive on 8/9. These showed an ABI of 0.44 at the right PTA. Monophasic waveforms. On the left her great toe ABI was 0.68. Her follow-up arterial Doppler showed a 50 to 74% stenosis in the proximal SFA and a 50 to 74% stenosis in the distal SFA mild to severe atherosclerosis noted throughout the extremity. Areas of shadowing plaque seen, unable to rule out higher grade stenosis she also had a fall apparently wearing a right foot forefoot off loader that we gave her. She therefore comes in in an ordinary running shoe today. Not certain if this is the fall that ended up in the hospital with bradycardia 9/6 area over the right first metatarsal head in the setting of her previous amputation and severe  PAD. She is also a diabetic with known PAD status post attempt at revascularization. She is continued smoker Again the separation of visits in the clinic is somewhat disturbing if we are going to consider her for a total contact cast. She is not wearing anything to offload this stating it causes imbalance especially the forefoot off loader. She basically comes in in bedroom slippers She also had a fall this morning about an hour ago. She has a skin tear on the left dorsal forearm 9/13; areas over the right first metatarsal head in the setting of previous TMA and severe PAD. Again she comes in here in slippers. We have been using Hydrofera Blue. We use MolecuLight on this which was essentially negative study 9/20; right first metatarsal head in the setting of her previous TMA and severe PAD. Same slipper type shoes. She says she cannot wear a forefoot off loader and for some reason she  will not wear surgical shoes. She says she is smoking half a pack per day. 9/30; right first metatarsal head. Absolutely no improvement in fact there is tunneling superiorly very close to bone. She does not offload this properly at all. We use MolecuLight on this 2 weeks ago that did not show any surface bacteria we have been using silver collagen is a dressing She tells me she is down to 3 cigarettes a day I have offered encouragement and a treatment plan 10/6; right first metatarsal head. Exposed bone this week which is not surprising. We have been using silver collagen. She is smoking 5 to 10 cigarettes a day 10/17; she has not been here in 10 days. The bone scraping that I did on 10/6 showed staph lugdunensis, staph epidermidis and Pseudomonas Alcalifenes. I put in for Septra DS 1 p.o. twice daily for 14 days last week would be but we could not reach her to start the antibiotic. Quinolones would have been a good alternative except she has multiple drug interactions. Septra would not cover what ever the Pseudomonas is  but I am not really sure of the relevance here. I am going to try her on the Septra for 2 weeks. She has a probing wound on the right TMA that probes to bone. She claims to have just about stop smoking I reviewed her arterial status. She underwent a left fem-tib bypass by Dr. Arbie Cookey in 06/28/2019. Her last angiogram was in July of this year by Dr. Allyson Sabal. This showed an occluded right SFA the popliteal and tibial vessels were also occluded the peroneal vessel filled by collaterals and filled the PT and DP by communicating collaterals. She was not felt to have any additional and vascular options. Dr. Allyson Sabal noted that she he had previously revascularized her right SFA 3 times. 10/25; she is tolerating the Septra but says it makes her nauseated. I am going to continue this for an additional 2 weeks perhaps for a total of 6 weeks. Her wound does not look too much different. There is on the right first met metatarsal head. There is probing areas around the tissue that is in the middle of the wound. There is no purulence I cannot feel bone. The original source was for a bone scraping. 11/8; right first metatarsal head. This does not probe to bone and there is no purulence. As far as I can tell she is completed the Septra although there were problems with exact length of time and I think compliance. In any case I gave her 6 weeks. I have reviewed her PAD above. 11/15; right first metatarsal head. Again protruding subcutaneous tissue but this does not have any adherence to surrounding skin. Undermining circumferentially. There is no palpable bone. Not grossly purulent. We have been using Iodoflex to try to get some adherence to clean off the surface but no real improvement. I do not think they actually had enough supplies listening to the patient. She completed the 6 weeks of Septra I gave her, but I am not sure about the compliance with the dates i.e. may have missed days taken 1 a day etc. The patient has a  vascular follow-up with Dr. Allyson Sabal this coming Friday i.e. in 3 days. By my read of arterial evaluations I do not think she is felt to be a candidate for any further revascularization. The last angiogram was in July. 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 Karla, Price ([id number]) [id number] Page 4 of 13  She continues to smoke now up to 10 cigarettes a day. She is not in any pain. Wound has not improved but is certainly not worsened. I gave her 6 weeks of trimethoprim/sulfamethoxazole which she is completed in some fashion. 11/29 right first metatarsal head previous TMA. Wound actually looks somewhat better today still a lot of callus around the wound on debris on the wound surface. UNFORTUNATELY the patient missed her appointment with Dr. Allyson Sabal and is not rebooked apparently until sometime in February 12/13; right first metatarsal wound actually looks some smaller today. I did not debride this today. She is using Iodoflex. When she came in last week she had a large abscess on her left anterior lower leg apparently after falling we send her to the ER to have this drained which was done. I cannot see any cultures x-ray was negative. She was apparently given 2 doses of IV antibiotics and discharged on Bactrim DS twice daily which she is still taking. As mentioned I cannot see any cultures. 12/29; culture of the abscess on the left leg I did last time showed Serratia. We gave her antibiotics. 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 She reports a callus to the right first met head and on the lateral aspect of the  left foot. She saw podiatry 3 days ago and had the callus debrided. There are no open wounds at this time. She follows up with podiatry in 2 weeks. Readmission: 04-19-2023 upon evaluation today patient appears to be doing well all things considered in regard to her wounds. She does have several wounds however on her feet 1 on the left foot centrally along the lateral edge and then a wound on the right lower leg more posterior. On the right foot plantar distal at the transmetatarsal amputation site this is the worst. With that being said she tells me this has been going on for quite some time she has been seen by podiatry they have done some debridements in the past. With that being said based on what we are seeing I believe that the patient's primary issue on the right side is that she is having some issues here with her arterial flow. The ABI on the right is 0.57 on the left is 1.04. With that being said this reading on the right is getting necessitated further evaluation by vascular. She tells me she has been seen by Dr. Gery Pray in the past and he stated there was nothing further to be done for the right leg. With that being said I think that it would be good to have a second opinion with vascular on this. Patient does have a history of diabetes mellitus type 2, peripheral vascular disease, and hypertension. The previous ABIs were performed on January 11, 2023. 04-26-2023 upon evaluation today patient appears to be doing about the same in regard to her wounds. She still has not had the arterial study at this point. I think this is something needs to be done as quickly as possible. Fortunately I do not see any evidence of active infection locally or systemically which is great news. No fevers, chills, nausea, vomiting, or diarrhea. 7/24;  the patient has not been here in about a month and a half. She has open wounds on the plantar right foot second metatarsal head in the setting of a previous TMA, she  has a new area on the left lateral foot and perhaps traumatic areas on the right anterior and left lateral lower legs. She has poorly controlled edema. The patient has known severe PAD has had multiple interventions in the right SFA. 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 She had an pressure of 60 at the dorsalis pedis giving her an ABI of 0.57 monophasic waveforms here. On the left her study was a lot better with an ABI 1.04 triphasic waveforms. The ABI might be falsely elevated on the left. I think at her last angiogram she had occluded right SFA popliteal and tibial vessels with the peroneal vessel being the only vessel feeding her foot via collaterals. She comes in this time in basic over-the-counter shoes 06-14-2023 upon evaluation today patient appears to be doing well currently in regard to her wounds all things considered except for the left lateral foot where she has what appears to be a blister this is erythematous as well and concerned about infection. I discussed with her today that I do think we need to see about doing the Vi core culture and subsequently depending on the results of this we will initiate antibiotic treatment as necessary. With that being said right now I think that she is going to need to be aggressive and elevating her legs she has an appointment with vascular coming up around 3 August is what I believe her appointment date is. Nonetheless once we get clearance from the standpoint of vascular she does have good blood flow and we can subsequently try to see what we do about getting her edema under much better control and then she really needs some much stronger compression than what we currently are able to do. 06-21-2023 upon evaluation today patient appears to be doing pretty well currently in regard to her wounds most of which seem to be showing signs  of improvement. She has been require little bit of debridement in regard to the right plantar foot wound I will do this as carefully as possible as we are still working on establishing that she has sufficient blood flow at this point. This right side on previous testing seem to be somewhat questionable. Fortunately I do not see any signs of active infection locally nor systemically at this time. Electronic Signature(s) Signed: 06/21/2023 7:43:47 PM By: Allen Derry PA-C Entered By: Allen Derry on 06/21/2023 19:43:47 -------------------------------------------------------------------------------- Physical Exam Details Patient Name: Date of Service: HA [name], [name] 06/21/2023 3:15 PM Medical Record Number: [id number] Patient Account Number: [id number] Date of Birth/Sex: Treating RN: June 20, 1961 (63 y.o. F) Primary Care Provider: [name] Other Clinician: Referring Provider: Treating Provider/Extender: [name], [name] Weeks in Treatment: 9 Constitutional Well-nourished and well-hydrated in no acute distress. LAYLA, LANTERMAN ([id number]) [id number] Page 5 of 13 Respiratory normal breathing without difficulty. Psychiatric this patient is able to make decisions and demonstrates good insight into disease process. Alert and Oriented x 3. pleasant and cooperative. Notes Upon inspection patient's wound bed actually showed signs of good granulation and epithelization she has a lot of improvement noted pretty much across the board I am pleased in that regard. Fortunately I do not see any signs  of active infection locally or systemically which is great news. Electronic Signature(s) Signed: 06/21/2023 7:44:03 PM By: Allen Derry PA-C Entered By: Allen Derry on 06/21/2023 19:44:03 -------------------------------------------------------------------------------- Physician Orders Details Patient Name: Date of Service: [name], [name] 06/21/2023 3:15  PM Medical Record Number: [id number] Patient Account Number: [id number] Date of Birth/Sex: Treating RN: 02-19-1961 (61 y.o. F) Primary Care Provider: [name] Other Clinician: Referring Provider: Treating Provider/Extender: [name], [name] Weeks in Treatment: 9 Verbal / Phone Orders: No Diagnosis Coding Follow-up Appointments ppointment in 1 week. Allen Derry PA Room 9 Return A Anesthetic (In clinic) Topical Lidocaine 5% applied to wound bed Bathing/ Shower/ Hygiene May shower with protection but do not get wound dressing(s) wet. Protect dressing(s) with water repellant cover (for example, large plastic bag) or a cast cover and may then take shower. Edema Control - Lymphedema / SCD / Other Bilateral Lower Extremities Elevate legs to the level of the heart or above for 30 minutes daily and/or when sitting for 3-4 times a day throughout the day. Avoid standing for long periods of time. Patient to wear own compression stockings every day. Exercise regularly - As tolerated Moisturize legs daily. Other Edema Control Orders/Instructions: - Recommend Compression stocking 15-65mm/Hg. Brochure for Elastic Therapy with printed leg measurements. Wound Treatment Wound #23 - Metatarsal head first Wound Laterality: Right Cleanser: Soap and Water 1 x Per Week/30 Days Discharge Instructions: May shower and wash wound with dial antibacterial soap and water prior to dressing change. Cleanser: Wound Cleanser 1 x Per Week/30 Days Discharge Instructions: Cleanse the wound with wound cleanser prior to applying a clean dressing using gauze sponges, not tissue or cotton balls. Peri-Wound Care: Sween Lotion (Moisturizing lotion) 1 x Per Week/30 Days Discharge Instructions: Apply moisturizing lotion as directed Prim Dressing: Maxorb Extra Ag+ Alginate Dressing, 4x4.75 (in/in) 1 x Per Week/30 Days ary Discharge Instructions: Apply to wound bed as instructed Secondary Dressing:  Optifoam Non-Adhesive Dressing, 4x4 in 1 x Per Week/30 Days Discharge Instructions: Apply foam donut and gauze Compression Wrap: Kerlix Roll 4.5x3.1 (in/yd) 1 x Per Week/30 Days Discharge Instructions: Apply Kerlix and Coban compression as directed. Compression Wrap: Coban Self-Adherent Wrap 4x5 (in/yd) 1 x Per Week/30 Days Discharge Instructions: Apply over Kerlix as directed. Karla, Price ([id number]) [id number] Page 6 of 13 Wound #24 - Foot Wound Laterality: Left, Lateral Cleanser: Soap and Water 1 x Per Week/30 Days Discharge Instructions: May shower and wash wound with dial antibacterial soap and water prior to dressing change. Cleanser: Wound Cleanser 1 x Per Week/30 Days Discharge Instructions: Cleanse the wound with wound cleanser prior to applying a clean dressing using gauze sponges, not tissue or cotton balls. Peri-Wound Care: Sween Lotion (Moisturizing lotion) 1 x Per Week/30 Days Discharge Instructions: Apply moisturizing lotion as directed Prim Dressing: Maxorb Extra Ag+ Alginate Dressing, 4x4.75 (in/in) 1 x Per Week/30 Days ary Discharge Instructions: Apply to wound bed as instructed Secondary Dressing: Optifoam Non-Adhesive Dressing, 4x4 in 1 x Per Week/30 Days Discharge Instructions: Apply foam donut and gauze Compression Wrap: Kerlix Roll 4.5x3.1 (in/yd) 1 x Per Week/30 Days Discharge Instructions: Apply Kerlix and Coban compression as directed. Compression Wrap: Coban Self-Adherent Wrap 4x5 (in/yd) 1 x Per Week/30 Days Discharge Instructions: Apply over Kerlix as directed. Wound #26 - Lower Leg Wound Laterality: Left, Lateral Cleanser: Soap and Water 1 x Per Week/30 Days Discharge Instructions: May shower and wash wound with dial antibacterial soap and water prior to dressing change. Cleanser: Wound Cleanser  1 x Per Week/30 Days Discharge Instructions: Cleanse the wound with wound cleanser prior to applying a clean dressing using gauze  sponges, not tissue or cotton balls. Peri-Wound Care: Sween Lotion (Moisturizing lotion) 1 x Per Week/30 Days Discharge Instructions: Apply moisturizing lotion as directed Prim Dressing: Maxorb Extra Ag+ Alginate Dressing, 4x4.75 (in/in) 1 x Per Week/30 Days ary Discharge Instructions: Apply to wound bed as instructed Secondary Dressing: Woven Gauze Sponge, Non-Sterile 4x4 in 1 x Per Week/30 Days Discharge Instructions: Apply over primary dressing as directed. Compression Wrap: Kerlix Roll 4.5x3.1 (in/yd) 1 x Per Week/30 Days Discharge Instructions: Apply Kerlix and Coban compression as directed. Compression Wrap: Coban Self-Adherent Wrap 4x5 (in/yd) 1 x Per Week/30 Days Discharge Instructions: Apply over Kerlix as directed. Wound #27 - Lower Leg Wound Laterality: Right, Anterior Cleanser: Soap and Water 1 x Per Week/30 Days Discharge Instructions: May shower and wash wound with dial antibacterial soap and water prior to dressing change. Cleanser: Wound Cleanser 1 x Per Week/30 Days Discharge Instructions: Cleanse the wound with wound cleanser prior to applying a clean dressing using gauze sponges, not tissue or cotton balls. Peri-Wound Care: Sween Lotion (Moisturizing lotion) 1 x Per Week/30 Days Discharge Instructions: Apply moisturizing lotion as directed Prim Dressing: Maxorb Extra Ag+ Alginate Dressing, 4x4.75 (in/in) 1 x Per Week/30 Days ary Discharge Instructions: Apply to wound bed as instructed Secondary Dressing: Woven Gauze Sponge, Non-Sterile 4x4 in 1 x Per Week/30 Days Discharge Instructions: Apply over primary dressing as directed. Compression Wrap: Kerlix Roll 4.5x3.1 (in/yd) 1 x Per Week/30 Days Discharge Instructions: Apply Kerlix and Coban compression as directed. Compression Wrap: Coban Self-Adherent Wrap 4x5 (in/yd) 1 x Per Week/30 Days Discharge Instructions: Apply over Kerlix as directed. Electronic Signature(s) Signed: 06/21/2023 7:55:21 PM By: Allen Derry  PA-C Signed: 06/22/2023 11:20:19 AM By: Karl Ito Entered By: Karl Ito on 06/21/2023 15:38:46 [name] ([id number]) [id number] Page 7 of 13 -------------------------------------------------------------------------------- Problem List Details Patient Name: Date of Service: [name] 06/21/2023 3:15 PM Medical Record Number: [id number] Patient Account Number: [id number] Date of Birth/Sex: Treating RN: [birth year] (63 y.o. F) Primary Care Provider: [name] Other Clinician: Referring Provider: Treating Provider/Extender: [name], [name] Weeks in Treatment: 9 Active Problems ICD-10 Encounter Code Description Active Date MDM Diagnosis 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 2 diabetes mellitus with foot ulcer 04/19/2023 No Yes L97.522 Non-pressure chronic ulcer of other part of left foot with fat layer exposed 04/19/2023 No Yes L97.512 Non-pressure chronic ulcer of other part of right foot with fat layer exposed 04/19/2023 No Yes E11.622 Type 2 diabetes mellitus with other skin ulcer 04/19/2023 No Yes L97.812 Non-pressure chronic ulcer of other part of right lower leg with fat layer 04/19/2023 No Yes exposed I73.89 Other specified peripheral vascular diseases 04/19/2023 No Yes I87.331 Chronic venous hypertension (idiopathic) with ulcer and inflammation of right 04/19/2023 No Yes lower extremity Inactive Problems Resolved Problems Electronic Signature(s) Signed: 06/21/2023 3:49:59 PM By: Allen Derry PA-C Entered By: Allen Derry on 06/21/2023 15:49:59 -------------------------------------------------------------------------------- Progress Note Details Patient Name: Date of Service: [name], [name] 06/21/2023 3:15 PM Medical Record Number: [id number] Patient Account Number: [id number] Date of Birth/Sex: Treating RN: 02-21-1961 (62 y.o. F) Primary Care Provider: [name] Other Clinician: Referring Provider: Treating Provider/Extender: [name], [name] Weeks in Treatment: [address], [place] ([id number]) [id number] Page 8 of 13 Subjective Chief Complaint Information obtained from Patient Bilateral LE Ulcers History of Present Illness (HPI) This 62 year old patient who has a very  long significant history of diabetes mellitus, previous alcohol and nicotine abuse, chronic data disease, COPD, diabetes mellitus, height hypertension, critical lower limb ischemia with several wounds being managed at the wound Center at [facility] for over a year. She was recently in the ER at [facility] and was referred to our center. He has had a long history of critical limb ischemia and over a period of time has had balloon angioplasties in March 2016, endarterectomy of the left carotid, lower extremity angiogram and treatment by Dr. Nanetta Batty, several cardiac catheterizations. Most recently she had an x-ray of her left foot while in the ER which showed no acute abnormality. during this ER visit she was started on ciprofloxacin and asked to continue with the wound care physicians at [facility]. Her last ABI done in June 2017 showed the right side to be 0.28 in the left side to be 0.48. Her right toe brachial index was 0.17 on the right and 0.27 on the left. her last hemoglobin A1c was 11.1% She continues to smoke about a pack of cigarettes a day. 03/28/2017 -- -- right foot x-ray -- IMPRESSION:Areas of soft tissue swelling. Mild subluxation second PIP joint. No frank dislocation or fracture. No erosive change or bony destruction. No soft tissue ulceration or radiopaque foreign body evident by radiography. There is plantar fascia calcification with a nearby inferior calcaneal spur. There are foci of arterial vascular calcification. 04/04/2017 -- the patient continues to be noncompliant and continues to complain of a lot of pain and has not done anything about quitting smoking Readmission: 06/26/18  on evaluation today patient presents for reevaluation she has not been seen in our office since May 2018. Since she was last seen here in our office she has undergone testing at Dr. Durenda Age office where it was revealed that she had an ABI of 0.68 with her previous ABI being 0.64 and a left ABI of 0.38 with previous ABI being 0.34 this study which was performed on 04/05/18 was pretty much equivalent to the study performed on 11/22/17. The findings in the end revealed on the right would appear to be moderate right lower extremity arterial disease on the left Dr. Allyson Sabal states moderate in the report but unfortunately this appears to be much more severe at 0.38 compared to the right. Subsequently the patient was scheduled to have angiography with Dr. Allyson Sabal on 04/12/18. This was however canceled due to the fact that the patient was found to have chronic kidney disease stage III and it was to the point that he did not feel that it will be safe to pursue angiography at that point. She has not been on any antibiotics recently. At this point Dr. Allyson Sabal has not rescheduled anything as far as the injured [place] according to the patient he is somewhat reluctant to do so. Nonetheless with her diminished blood flow this is gonna make it somewhat difficult for her to heal. 07/05/18; this is a patient I have not seen previously. She has very significant PAD as noted above. She apparently has had revascularization efforts by Dr. Allyson Sabal on the right on 3 occasions to the patient. She was supposed to have an attempted angiography on the left however this was canceled apparently because of stage III chronic renal failure. I will need to research all of this. She complains of significant pain in the wound and has claudication enough that when she walks to the end of the driveway she has to stop. She is been  using silver alginate to the wounds on her legs and Iodoflex to the area on her left second toe. 07/13/18 on  evaluation today patient's wounds actually appear to be doing about the same. She has an appointment she tells me within the next month that is September 2019 with Dr. Allyson Sabal to discuss options to see if there's anything else that you can recommend or do for her. Nonetheless obviously what we're trying to do is do what we can to save her leg and in turn prevent any additional worsening or damage. None in the meantime we been mainly trying to manage her ulcers as best we can. 07/27/18; some improvement in the multiplicity of wounds on her left lower extremity and foot. She's been using silver alginate. I was unable to determine that she actually has an appointment with Dr. Allyson Sabal. We are checking into this The patient's wound includes Left lateral foot, left plantar heel, her left anterior calf wound looks close to me, left fourth toe is very close to closed and the left medial calf is perhaps the largest open area READMISSION 02/19/2021 This is a now 62 year old woman with type 2 diabetes and continued cigarette smoking. She has known PAD. She was last in this clinic in 2019 at that point with wounds on her left foot. She left in a nonhealed state. On 06/28/2019 I see she had a left femoropopliteal by vein and vascular with an amputation of the fifth ray. These managed to heal over. Her last arterial studies were in February 2021. This showed an absent waveform at the posterior tibial artery on the right a dampened monophasic dorsalis pedis on the right of 0.44. On the left again the PTA TA was absent her dorsalis pedis was 0.99 triphasic and her great toe pressure was 0.65. This would have been after her revascularization. Once again she tells me that Dr. Allyson Sabal has done revascularization on the right leg on 3 different occasions. She has a right transmetatarsal amputation apparently done by Dr. Lajoyce Corners remotely but I do not see a note for these right lower extremity revascularization but I may not of looked  back far enough. In any case she says that the she has a wound on the plantar aspect of the right transmetatarsal amputation site there is been there for several months. More recently she fell and had a wound on her right medial lower leg she had 5 sutures placed in the ER and then subsequently she has developed an area on the medial lower leg which was a blister that opened up. Past medical history includes type 2 diabetes, PAD, chronic renal failure, congestive heart failure, right transmetatarsal amputation, left fifth ray amputation, continued tobacco abuse, atrial fibrillation and cirrhosis. We did not attempt an ABI on the right leg today because of pain 02/26/2021; patient we admitted to the clinic last week. She has what looks to be 2 areas on her right medial and right anterior lower leg that look more like venous wounds but she has 1 on the first met head at the base of her right transmet. She has known severe PAD. She complains of a lot of pain although some of this may be neuropathic. Is difficult to exclude a component of claudication. Use silver alginate on the wounds on her legs and Iodoflex on the area on the first met head. She has an appointment with Dr. Allyson Sabal on 5/25 although I will text him and discussed the situation. She is have poorly controlled diabetic. She  has has stage IIIb chronic renal failure 4/22; patient presents for 1 week follow-up. The 2 areas on her right medial and right anterior lower leg appear well-healing. She has been using silver alginate to this area without issues. She has a first met head ulcer and it is unclear how long this has been there as it was discovered in the ED earlier this month when she was being evaluated for another issue. Iodoflex has been used at this area. 4/29; patient presents for 1 week follow-up. She has been using silver alginate to the leg wounds and Iodoflex to the plantar foot wound. She has had this wrapped with Coban and Kerlix.  She has no concerns or complaints today. 5/6; this is a difficult wound on the right plantar foot transmit site. We are not making a lot of progress. She had 2 more venous looking wounds on the right medial and right anterior lower leg 1 of which is healed. Her appointment with Dr. Allyson Sabal is on 5/25 5/16; she did not tolerate the offloading shoe we gave her in fact she had a fall with an abrasion on her right forearm she is now back in the modified small shoe which does not offload her foot properly. Her appoint with Dr. Allyson Sabal is still on 5/25 we have been using Iodoflex. The area on the right anterior lower leg is Karla, Price ([id number]) [id number] Page 9 of 13 healed 7/18; patient presents for follow-up however has not been seen in 2 months. She was last seen at the end of May. She had a fall at the end of June and was hospitalized. She has been unable to follow-up since then. For the wound she has only been keeping it covered with gauze. She is scheduled for an aortogram with lower extremity runoff at the end of July. 7/29; patient presents for 2-week follow-up. She has home health and they have been changing her dressings. She denies any issues and has no complaints today. She states she had a procedure where they opened up one of her vessels in her legs. She denies signs of infection. 8/5; patient presents for follow-up. She was recently in the hospital for bradycardia. She was noted to have cellulitis to the left lower extremity and Unna boot was placed due to blisters and increased swelling. She reports improvement to her left leg since being in the hospital and stability check her right plantar foot wound. She denies signs of infection. 8/23; since I last saw this patient a lot has happened. She still has the area over the right first met head in the setting of previous transmetatarsal amputation. Firstly most importantly she underwent revascularization of her  occluded right SFA. She underwent directional atherectomy followed by drug-coated balloon angioplasty. She has no named vessel below the knee hopefully the revascularization of the right SFA will improve collateral flow. It is not felt however that she has any endovascular options. She had follow-up arterial studies noninvasive on 8/9. These showed an ABI of 0.44 at the right PTA. Monophasic waveforms. On the left her great toe ABI was 0.68. Her follow-up arterial Doppler showed a 50 to 74% stenosis in the proximal SFA and a 50 to 74% stenosis in the distal SFA mild to severe atherosclerosis noted throughout the extremity. Areas of shadowing plaque seen, unable to rule out higher grade stenosis she also had a fall apparently wearing a right foot forefoot off loader that we gave her. She therefore comes in in an ordinary running  shoe today. Not certain if this is the fall that ended up in the hospital with bradycardia 9/6 area over the right first metatarsal head in the setting of her previous amputation and severe PAD. She is also a diabetic with known PAD status post attempt at revascularization. She is continued smoker Again the separation of visits in the clinic is somewhat disturbing if we are going to consider her for a total contact cast. She is not wearing anything to offload this stating it causes imbalance especially the forefoot off loader. She basically comes in in bedroom slippers She also had a fall this morning about an hour ago. She has a skin tear on the left dorsal forearm 9/13; areas over the right first metatarsal head in the setting of previous TMA and severe PAD. Again she comes in here in slippers. We have been using Hydrofera Blue. We use MolecuLight on this which was essentially negative study 9/20; right first metatarsal head in the setting of her previous TMA and severe PAD. Same slipper type shoes. She says she cannot wear a forefoot off loader and for some reason she will  not wear surgical shoes. She says she is smoking half a pack per day. 9/30; right first metatarsal head. Absolutely no improvement in fact there is tunneling superiorly very close to bone. She does not offload this properly at all. We use MolecuLight on this 2 weeks ago that did not show any surface bacteria we have been using silver collagen is a dressing She tells me she is down to 3 cigarettes a day I have offered encouragement and a treatment plan 10/6; right first metatarsal head. Exposed bone this week which is not surprising. We have been using silver collagen. She is smoking 5 to 10 cigarettes a day 10/17; she has not been here in 10 days. The bone scraping that I did on 10/6 showed staph lugdunensis, staph epidermidis and Pseudomonas Alcalifenes. I put in for Septra DS 1 p.o. twice daily for 14 days last week would be but we could not reach her to start the antibiotic. Quinolones would have been a good alternative except she has multiple drug interactions. Septra would not cover what ever the Pseudomonas is but I am not really sure of the relevance here. I am going to try her on the Septra for 2 weeks. She has a probing wound on the right TMA that probes to bone. She claims to have just about stop smoking I reviewed her arterial status. She underwent a left fem-tib bypass by Dr. Arbie Cookey in 06/28/2019. Her last angiogram was in July of this year by Dr. Allyson Sabal. This showed an occluded right SFA the popliteal and tibial vessels were also occluded the peroneal vessel filled by collaterals and filled the PT and DP by communicating collaterals. She was not felt to have any additional and vascular options. Dr. Allyson Sabal noted that she he had previously revascularized her right SFA 3 times. 10/25; she is tolerating the Septra but says it makes her nauseated. I am going to continue this for an additional 2 weeks perhaps for a total of 6 weeks. Her wound does not look too much different. There is on the right  first met metatarsal head. There is probing areas around the tissue that is in the middle of the wound. There is no purulence I cannot feel bone. The original source was for a bone scraping. 11/8; right first metatarsal head. This does not probe to bone and there is no purulence.  As far as I can tell she is completed the Septra although there were problems with exact length of time and I think compliance. In any case I gave her 6 weeks. I have reviewed her PAD above. 11/15; right first metatarsal head. Again protruding subcutaneous tissue but this does not have any adherence to surrounding skin. Undermining circumferentially. There is no palpable bone. Not grossly purulent. We have been using Iodoflex to try to get some adherence to clean off the surface but no real improvement. I do not think they actually had enough supplies listening to the patient. She completed the 6 weeks of Septra I gave her, but I am not sure about the compliance with the dates i.e. may have missed days taken 1 a day etc. The patient has a vascular follow-up with Dr. Allyson Sabal this coming Friday i.e. in 3 days. By my read of arterial evaluations I do not think she is felt to be a candidate for any further revascularization. The last angiogram was in July. 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 She continues to smoke now up to 10 cigarettes a day. She is not in any pain. Wound has not improved but is certainly not worsened. I gave her 6 weeks of trimethoprim/sulfamethoxazole which she is completed in some fashion. 11/29 right first metatarsal head previous TMA. Wound actually looks somewhat better today still a lot of callus around the wound on debris on the wound surface. UNFORTUNATELY the patient missed her appointment with Dr. Allyson Sabal and is not rebooked apparently until sometime in February 12/13; right first metatarsal wound actually looks some smaller today. I did not debride this today. She is using  Iodoflex.  When she came in last week she had a large abscess on her left anterior lower leg apparently after falling we send her to the ER to have this drained which was done. I cannot see any cultures x-ray was negative. She was apparently given 2 doses of IV antibiotics and discharged on Bactrim DS twice daily which she is still taking. As mentioned I cannot see any cultures. 12/29; culture of the abscess on the left leg I did last time showed Serratia. We gave her antibiotics. I note she was in the ER next day with bleeding which they controlled she is on anticoagulants.She comes in today to clinic and the area on her right plantar foot is miraculously very hyperkeratotic but healed 02/06/2023 Ms. 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 She reports a callus to the right first met head and on the lateral aspect of the left foot. She saw podiatry 3 days ago and had the callus debrided. There are no open wounds at this time. She follows up with podiatry in 2 weeks. Readmission: 04-19-2023 upon evaluation today patient appears to be doing well all things considered in regard to her wounds. She does have several wounds however on her feet 1 on the left foot centrally along the lateral edge and then a wound on the right lower leg more posterior. On the right foot plantar distal at the transmetatarsal amputation site this is the worst. With that being said she tells me this has been going on for quite some time she has been seen by podiatry they have done some debridements in the past. With that being said based on what we are seeing I believe that  the patient's primary issue on the right side is that she is having some issues here with her arterial flow. The ABI on the right is 0.57 on the left is 1.04. With that being said this reading on the right is getting necessitated further  evaluation by vascular. She tells me she has been seen by Dr. Gery Pray in the past and he stated there was nothing further to be done for the right leg. With that being said I think that it would be good to have a second opinion with vascular on this. Patient does have a history of diabetes mellitus type 2, peripheral vascular disease, and hypertension. The previous ABIs were performed on January 11, 2023. Karla, Price ([id number]) [id number] Page 10 of 13 04-26-2023 upon evaluation today patient appears to be doing about the same in regard to her wounds. She still has not had the arterial study at this point. I think this is something needs to be done as quickly as possible. Fortunately I do not see any evidence of active infection locally or systemically which is great news. No fevers, chills, nausea, vomiting, or diarrhea. 7/24; the patient has not been here in about a month and a half. She has open wounds on the plantar right foot second metatarsal head in the setting of a previous TMA, she has a new area on the left lateral foot and perhaps traumatic areas on the right anterior and left lateral lower legs. She has poorly controlled edema. The patient has known severe PAD has had multiple interventions in the right SFA. 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 She had an pressure of 60 at the dorsalis pedis giving her an ABI of 0.57 monophasic waveforms here. On the left her study was a lot better with an ABI 1.04 triphasic waveforms. The ABI might be falsely elevated on the left. I think at her last angiogram she had occluded right SFA popliteal and tibial vessels with the peroneal vessel being the only vessel feeding her foot via collaterals. She comes in this time in basic over-the-counter shoes 06-14-2023 upon evaluation today patient appears to be doing  well currently in regard to her wounds all things considered except for the left lateral foot where she has what appears to be a blister this is erythematous as well and concerned about infection. I discussed with her today that I do think we need to see about doing the Vi core culture and subsequently depending on the results of this we will initiate antibiotic treatment as necessary. With that being said right now I think that she is going to need to be aggressive and elevating her legs she has an appointment with vascular coming up around 3 August is what I believe her appointment date is. Nonetheless once we get clearance from the standpoint of vascular she does have good blood flow and we can subsequently try to see what we do about getting her edema under much better control and then she really needs some much stronger compression than what we currently are able to do. 06-21-2023 upon evaluation today patient appears to be doing pretty well currently in regard to her wounds most of which seem to be showing signs of improvement. She has been require little bit of debridement in regard to the right plantar foot wound I will do this as carefully as possible as we are still  working on establishing that she has sufficient blood flow at this point. This right side on previous testing seem to be somewhat questionable. Fortunately I do not see any signs of active infection locally nor systemically at this time. Objective Constitutional Well-nourished and well-hydrated in no acute distress. Vitals Time Taken: 3:24 PM, Height: 64 in, Weight: 225 lbs, BMI: 38.6, Temperature: 98.6 F, Pulse: 87 bpm, Respiratory Rate: 18 breaths/min, Blood Pressure: 146/74 mmHg. Respiratory normal breathing without difficulty. Psychiatric this patient is able to make decisions and demonstrates good insight into disease process. Alert and Oriented x 3. pleasant and cooperative. General Notes: Upon inspection patient's wound  bed actually showed signs of good granulation and epithelization she has a lot of improvement noted pretty much across the board I am pleased in that regard. Fortunately I do not see any signs of active infection locally or systemically which is great news. Integumentary (Hair, Skin) Wound #23 status is Open. Original cause of wound was Gradually Appeared. The date acquired was: 04/18/2021. The wound has been in treatment 9 weeks. The wound is located on the Right Metatarsal head first. The wound measures 0.9cm length x 0.7cm width x 0.3cm depth; 0.495cm^2 area and 0.148cm^3 volume. There is Fat Layer (Subcutaneous Tissue) exposed. There is a medium amount of serosanguineous drainage noted. The wound margin is distinct with the outline attached to the wound base. There is medium (34-66%) pink granulation within the wound bed. There is a medium (34-66%) amount of necrotic tissue within the wound bed including Adherent Slough. The periwound skin appearance had no abnormalities noted for moisture. The periwound skin appearance had no abnormalities noted for color. The periwound skin appearance exhibited: Callus. Periwound temperature was noted as No Abnormality. Wound #24 status is Open. Original cause of wound was Gradually Appeared. The date acquired was: 02/06/2023. The wound has been in treatment 9 weeks. The wound is located on the Left,Lateral Foot. The wound measures 0.5cm length x 0.5cm width x 0.1cm depth; 0.196cm^2 area and 0.02cm^3 volume. There is Fat Layer (Subcutaneous Tissue) exposed. There is a medium amount of serosanguineous drainage noted. The wound margin is distinct with the outline attached to the wound base. There is small (1-33%) pale granulation within the wound bed. There is a large (67-100%) amount of necrotic tissue within the wound bed including Eschar and Adherent Slough. The periwound skin appearance had no abnormalities noted for moisture. The periwound skin appearance  exhibited: Callus, Ecchymosis, Erythema. The periwound skin appearance did not exhibit: Crepitus, Excoriation, Induration, Rash, Scarring, Atrophie Blanche, Cyanosis, Hemosiderin Staining, Mottled, Pallor, Rubor. The surrounding wound skin color is noted with erythema which is circumferential. Periwound temperature was noted as No Abnormality. Wound #26 status is Open. Original cause of wound was Trauma. The date acquired was: 06/03/2023. The wound has been in treatment 2 weeks. The wound is located on the Left,Lateral Lower Leg. The wound measures 0.3cm length x 0.3cm width x 0.1cm depth; 0.071cm^2 area and 0.007cm^3 volume. There is Fat Layer (Subcutaneous Tissue) exposed. There is a medium amount of serosanguineous drainage noted. The wound margin is distinct with the outline attached to the wound base. There is medium (34-66%) red, pink granulation within the wound bed. There is a medium (34-66%) amount of necrotic tissue within the wound bed including Adherent Slough. The periwound skin appearance exhibited: Hemosiderin Staining. The periwound skin appearance did not exhibit: Callus, Crepitus, Excoriation, Induration, Rash, Scarring, Dry/Scaly, Maceration, Atrophie Blanche, Cyanosis, Ecchymosis, Mottled, Pallor, Rubor, Erythema. Wound #27 status is Open.  Original cause of wound was Trauma. The date acquired was: 06/03/2023. The wound has been in treatment 2 weeks. The wound is located on the Right,Anterior Lower Leg. The wound measures 1.5cm length x 4cm width x 0.1cm depth; 4.712cm^2 area and 0.471cm^3 volume. There is Fat Layer (Subcutaneous Tissue) exposed. There is a medium amount of serosanguineous drainage noted. The wound margin is distinct with the outline attached to the wound base. There is small (1-33%) red, pink granulation within the wound bed. There is a large (67-100%) amount of necrotic tissue within the wound bed including Eschar and Adherent Slough. The periwound skin appearance  exhibited: Hemosiderin Staining. The periwound skin appearance did not exhibit: Callus, Crepitus, Excoriation, Induration, Rash, Scarring, Dry/Scaly, Maceration, Atrophie Blanche, Cyanosis, Ecchymosis, Mottled, Pallor, Rubor, Erythema. ANNETT, DUNNAVANT ([id number]) [id number] Page 11 of 13 Assessment Active Problems ICD-10 Type 2 diabetes mellitus with foot ulcer Non-pressure chronic ulcer of other part of left foot with fat layer exposed Non-pressure chronic ulcer of other part of right foot with fat layer exposed Type 2 diabetes mellitus with other skin ulcer Non-pressure chronic ulcer of other part of right lower leg with fat layer exposed Other specified peripheral vascular diseases Chronic venous hypertension (idiopathic) with ulcer and inflammation of right lower extremity Procedures Wound #23 Pre-procedure diagnosis of Wound #23 is a Diabetic Wound/Ulcer of the Lower Extremity located on the Right Metatarsal head first .Severity of Tissue Pre Debridement is: Fat layer exposed. There was a Excisional Skin/Subcutaneous Tissue Debridement with a total area of 0.49 sq cm performed by Lenda Kelp, PA. With the following instrument(s): Curette to remove Viable and Non-Viable tissue/material. Material removed includes Callus, Subcutaneous Tissue, and Slough after achieving pain control using Lidocaine 4% Topical Solution. No specimens were taken. A time out was conducted at 16:00, prior to the start of the procedure. A Minimum amount of bleeding was controlled with Pressure. The procedure was tolerated well with a pain level of 0 throughout and a pain level of 0 following the procedure. Post Debridement Measurements: 0.9cm length x 0.7cm width x 0.3cm depth; 0.148cm^3 volume. Character of Wound/Ulcer Post Debridement is improved. Severity of Tissue Post Debridement is: Fat layer exposed. Post procedure Diagnosis Wound #23: Same as Pre-Procedure General Notes:  Scribed for Dr Leonard Schwartz by Brenton Grills RN.[name] Plan Follow-up Appointments: Return Appointment in 1 week. Allen Derry PA Room 9 Anesthetic: (In clinic) Topical Lidocaine 5% applied to wound bed Bathing/ Shower/ Hygiene: May shower with protection but do not get wound dressing(s) wet. Protect dressing(s) with water repellant cover (for example, large plastic bag) or a cast cover and may then take shower. Edema Control - Lymphedema / SCD / Other: Elevate legs to the level of the heart or above for 30 minutes daily and/or when sitting for 3-4 times a day throughout the day. Avoid standing for long periods of time. Patient to wear own compression stockings every day. Exercise regularly - As tolerated Moisturize legs daily. Other Edema Control Orders/Instructions: - Recommend Compression stocking 15-69mm/Hg. Brochure for [employer] with printed leg measurements. WOUND #23: - Metatarsal head first Wound Laterality: Right Cleanser: Soap and Water 1 x Per Week/30 Days Discharge Instructions: May shower and wash wound with dial antibacterial soap and water prior to dressing change. Cleanser: Wound Cleanser 1 x Per Week/30 Days Discharge Instructions: Cleanse the wound with wound cleanser prior to applying a clean dressing using gauze sponges, not tissue or cotton balls. Peri-Wound Care: Sween Lotion (Moisturizing lotion) 1 x Per  Week/30 Days Discharge Instructions: Apply moisturizing lotion as directed Prim Dressing: Maxorb Extra Ag+ Alginate Dressing, 4x4.75 (in/in) 1 x Per Week/30 Days ary Discharge Instructions: Apply to wound bed as instructed Secondary Dressing: Optifoam Non-Adhesive Dressing, 4x4 in 1 x Per Week/30 Days Discharge Instructions: Apply foam donut and gauze Com pression Wrap: Kerlix Roll 4.5x3.1 (in/yd) 1 x Per Week/30 Days Discharge Instructions: Apply Kerlix and Coban compression as directed. Com pression Wrap: Coban Self-Adherent Wrap 4x5 (in/yd) 1 x Per Week/30  Days Discharge Instructions: Apply over Kerlix as directed. WOUND #24: - Foot Wound Laterality: Left, Lateral Cleanser: Soap and Water 1 x Per Week/30 Days Discharge Instructions: May shower and wash wound with dial antibacterial soap and water prior to dressing change. Cleanser: Wound Cleanser 1 x Per Week/30 Days Discharge Instructions: Cleanse the wound with wound cleanser prior to applying a clean dressing using gauze sponges, not tissue or cotton balls. Peri-Wound Care: Sween Lotion (Moisturizing lotion) 1 x Per Week/30 Days Discharge Instructions: Apply moisturizing lotion as directed Prim Dressing: Maxorb Extra Ag+ Alginate Dressing, 4x4.75 (in/in) 1 x Per Week/30 Days ary Discharge Instructions: Apply to wound bed as instructed Secondary Dressing: Optifoam Non-Adhesive Dressing, 4x4 in 1 x Per Week/30 Days Discharge Instructions: Apply foam donut and gauze Com pression Wrap: Kerlix Roll 4.5x3.1 (in/yd) 1 x Per Week/30 Days Discharge Instructions: Apply Kerlix and Coban compression as directed. Com pression Wrap: Coban Self-Adherent Wrap 4x5 (in/yd) 1 x Per Week/30 Days Discharge Instructions: Apply over Kerlix as directed. WOUND #26: - Lower Leg Wound Laterality: Left, Lateral Cleanser: Soap and Water 1 x Per Week/30 Days Discharge Instructions: May shower and wash wound with dial antibacterial soap and water prior to dressing change. Cleanser: Wound Cleanser 1 x Per Week/30 Days Discharge Instructions: Cleanse the wound with wound cleanser prior to applying a clean dressing using gauze sponges, not tissue or cotton balls. Peri-Wound Care: Sween Lotion (Moisturizing lotion) 1 x Per Week/30 Days Discharge Instructions: Apply moisturizing lotion as directed Prim Dressing: Maxorb Extra Ag+ Alginate Dressing, 4x4.75 (in/in) 1 x Per Week/30 Days Karla, Price ([id number]) [id number] Page 12 of 13 Discharge Instructions: Apply to wound bed as  instructed Secondary Dressing: Woven Gauze Sponge, Non-Sterile 4x4 in 1 x Per Week/30 Days Discharge Instructions: Apply over primary dressing as directed. Com pression Wrap: Kerlix Roll 4.5x3.1 (in/yd) 1 x Per Week/30 Days Discharge Instructions: Apply Kerlix and Coban compression as directed. Com pression Wrap: Coban Self-Adherent Wrap 4x5 (in/yd) 1 x Per Week/30 Days Discharge Instructions: Apply over Kerlix as directed. WOUND #27: - Lower Leg Wound Laterality: Right, Anterior Cleanser: Soap and Water 1 x Per Week/30 Days Discharge Instructions: May shower and wash wound with dial antibacterial soap and water prior to dressing change. Cleanser: Wound Cleanser 1 x Per Week/30 Days Discharge Instructions: Cleanse the wound with wound cleanser prior to applying a clean dressing using gauze sponges, not tissue or cotton balls. Peri-Wound Care: Sween Lotion (Moisturizing lotion) 1 x Per Week/30 Days Discharge Instructions: Apply moisturizing lotion as directed Prim Dressing: Maxorb Extra Ag+ Alginate Dressing, 4x4.75 (in/in) 1 x Per Week/30 Days ary Discharge Instructions: Apply to wound bed as instructed Secondary Dressing: Woven Gauze Sponge, Non-Sterile 4x4 in 1 x Per Week/30 Days Discharge Instructions: Apply over primary dressing as directed. Com pression Wrap: Kerlix Roll 4.5x3.1 (in/yd) 1 x Per Week/30 Days Discharge Instructions: Apply Kerlix and Coban compression as directed. Com pression Wrap: Coban Self-Adherent Wrap 4x5 (in/yd) 1 x Per Week/30 Days Discharge Instructions: Apply  over Kerlix as directed. 1. Based on what I see I do believe that the patient is making excellent headway towards closure. I am very pleased with where we stand and I think that she is overall doing well we are still waiting on the appointment with vascular she missed the first 1 they had to reschedule it is not toward her somewhere around the 23rd or so of this month. 2. I am good recommend that we  continue with the Curlex and Coban wrap and also can recommend that she continue with the silver alginate dressing. We will see patient back for reevaluation in 1 week here in the clinic. If anything worsens or changes patient will contact our office for additional recommendations. Electronic Signature(s) Signed: 06/21/2023 7:44:35 PM By: Allen Derry PA-C Entered By: Allen Derry on 06/21/2023 19:44:35 -------------------------------------------------------------------------------- SuperBill Details Patient Name: Date of Service: [name], [name] 06/21/2023 Medical Record Number: [id number] Patient Account Number: [id number] Date of Birth/Sex: Treating RN: February 14, 1961 (62 y.o. F) Primary Care Provider: [name] Other Clinician: Referring Provider: Treating Provider/Extender: [name], [name] Weeks in Treatment: 9 Diagnosis Coding ICD-10 Codes Code Description 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.522 Non-pressure chronic ulcer of other part of left foot with fat layer exposed L97.512 Non-pressure chronic ulcer of other part of right foot with fat layer exposed E11.622 Type 2 diabetes mellitus with other skin ulcer L97.812 Non-pressure chronic ulcer of other part of right lower leg with fat layer exposed I73.89 Other specified peripheral vascular diseases I87.331 Chronic venous hypertension (idiopathic) with ulcer and inflammation of right lower extremity Facility Procedures : CPT4 Code: 25956387 Description: 11042 - DEB SUBQ TISSUE 20 SQ CM/< ICD-10 Diagnosis Description L97.512 Non-pressure chronic ulcer of other part of right foot with fat layer exposed Modifier: Quantity: 1 Physician Procedures : CPT4 Code Description Modifier KANIJA, ROMAIN ([id number]) [id number] Page 1 [id number] 11042 - WC PHYS SUBQ TISS 20 SQ CM 1 ICD-10 Diagnosis Description L97.512 Non-pressure chronic ulcer of other part of right foot with fat   layer exposed Quantity: 3 of 13 Electronic Signature(s) Signed: 06/21/2023 7:44:50 PM By: Allen Derry PA-C Entered By: Allen Derry on 06/21/2023 19:44:49

## 2023-06-22 NOTE — Progress Notes (Signed)
ELSI, BUDD ([id number]) [id number] Page 1 of 10 Visit Report for 06/21/2023 Arrival Information Details Patient Name: Date of Service: [name] 06/21/2023 3:15 PM Medical Record Number: [id number] Patient Account Number: [id number] Date of Birth/Sex: Treating RN: September 07, 1961 (62 y.o. F) Primary Care : [name] Other Clinician: Referring : Treating /Extender: [name], [name] Weeks in Treatment: 9 Visit Information History Since Last Visit Added or deleted any medications: No Patient Arrived: Walker Any new allergies or adverse reactions: No Arrival Time: 15:23 Had a fall or experienced change in No Accompanied By: self activities of daily living that may affect Transfer Assistance: None risk of falls: Patient Identification Verified: Yes Signs or symptoms of abuse/neglect since last visito No Secondary Verification Process Completed: Yes Hospitalized since last visit: No Patient Requires Transmission-Based Precautions: No Implantable device outside of the clinic excluding No Patient Has Alerts: Yes cellular tissue based products placed in the center Patient Alerts: ABI R 0.57 (01/11/23) since last visit: ABI L 1.04 (01/11/23) Pain Present Now: No Electronic Signature(Price) Signed: 06/22/2023 11:20:19 AM By: Karl Ito Entered By: Karl Ito on 06/21/2023 15:24:23 -------------------------------------------------------------------------------- Encounter Discharge Information Details Patient Name: Date of Service: Karla Bigness, DO [name]. 06/21/2023 3:15 PM Medical Record Number: [id number] Patient Account Number: [id number] Date of Birth/Sex: Treating RN: Mar 17, 1961 (61 y.o. F) Primary Care : [name] Other Clinician: Referring : Treating /Extender: [name], [name] Weeks in Treatment: 9 Encounter Discharge Information Items Post Procedure  Vitals Discharge Condition: Stable Temperature (F): 98.6 Ambulatory Status: Walker Pulse (bpm): 78 Discharge Destination: Home Respiratory Rate (breaths/min): 18 Transportation: Private Auto Blood Pressure (mmHg): 118/65 Accompanied By: self Schedule Follow-up Appointment: Yes Clinical Summary of Care: Patient Declined Electronic Signature(Price) Signed: 06/22/2023 11:20:19 AM By: Karl Ito Entered By: Karl Ito on 06/21/2023 16:14:38 [name] ([id number]) [id number] Page 2 of 10 -------------------------------------------------------------------------------- Lower Extremity Assessment Details Patient Name: Date of Service: [name] 06/21/2023 3:15 PM Medical Record Number: [id number] Patient Account Number: [id number] Date of Birth/Sex: Treating RN: 22-Nov-1960 (62 y.o. F) Primary Care : [name] Other Clinician: Referring : Treating /Extender: [name], [name] Weeks in Treatment: 9 Edema Assessment Assessed: [Left: No] [Right: No] Edema: [Left: Yes] [Right: Yes] Calf Left: Right: Point of Measurement: 30 cm From Medial Instep 46 cm 46 cm Ankle Left: Right: Point of Measurement: 9 cm From Medial Instep 23 cm 23 cm Vascular Assessment Pulses: Dorsalis Pedis Palpable: [Left:Yes] [Right:Yes] Extremity colors, hair growth, and conditions: Extremity Color: [Left:Hyperpigmented] [Right:Hyperpigmented] Hair Growth on Extremity: [Left:No] [Right:No] Temperature of Extremity: [Left:Warm] [Right:Warm] Capillary Refill: [Left:< 3 seconds] [Right:< 3 seconds] Dependent Rubor: [Left:No Yes] [Right:No Yes] Toe Nail Assessment Left: Right: Thick: Yes Discolored: Yes Deformed: No Improper Length and Hygiene: No Electronic Signature(Price) Signed: 06/22/2023 11:20:19 AM By: Karl Ito Entered By: Karl Ito on 06/21/2023  15:35:20 -------------------------------------------------------------------------------- Multi-Disciplinary Care Plan Details Patient Name: Date of Service: Karla Bigness, DO [name]. 06/21/2023 3:15 PM Medical Record Number: [id number] Patient Account Number: [id number] Date of Birth/Sex: Treating RN: [birth year] (61 y.o. F) Primary Care : [name] Other Clinician: Referring : Treating /Extender: [name], [name] Weeks in Treatment: [address] [name], [name] ([id number]) [id number] Page 3 of 10 Wound/Skin Impairment Nursing Diagnoses: Impaired tissue integrity Goals: Patient/caregiver will verbalize understanding of skin care regimen Date Initiated: 04/19/2023 Target Resolution Date: 08/19/2023 Goal Status: Active Interventions: Assess ulceration(Price) every visit Treatment Activities: Skin care regimen initiated :  04/19/2023 Notes: Electronic Signature(Price) Signed: 06/22/2023 11:20:19 AM By: Karl Ito Entered By: Karl Ito on 06/21/2023 15:38:57 -------------------------------------------------------------------------------- Pain Assessment Details Patient Name: Date of Service: Karla Bigness, DO [name]. 06/21/2023 3:15 PM Medical Record Number: [id number] Patient Account Number: [id number] Date of Birth/Sex: Treating RN: 24-Nov-1960 (62 y.o. F) Primary Care : [name] Other Clinician: Referring : Treating /Extender: [name], [name] Weeks in Treatment: 9 Active Problems Location of Pain Severity and Description of Pain Patient Has Paino Yes Site Locations Rate the pain. Current Pain Level: 6 Pain Management and Medication Current Pain Management: Electronic Signature(Price) Signed: 06/22/2023 11:20:19 AM By: Karl Ito Entered By: Karl Ito on 06/21/2023 15:25:10 [name] ([id number]) [id number] Page 4 of  10 -------------------------------------------------------------------------------- Patient/Caregiver Education Details Patient Name: Date of Service: [name] 8/7/2024andnbsp3:15 PM Medical Record Number: [id number] Patient Account Number: [id number] Date of Birth/Gender: Treating RN: January 05, 1961 (62 y.o. F) Primary Care Physician: [name] Other Clinician: Referring Physician: Treating Physician/Extender: Juleen China Weeks in Treatment: 9 Education Assessment Education Provided To: Patient Education Topics Provided Wound/Skin Impairment: Methods: Explain/Verbal Responses: State content correctly Electronic Signature(Price) Signed: 06/22/2023 11:20:19 AM By: Karl Ito Entered By: Karl Ito on 06/21/2023 15:39:19 -------------------------------------------------------------------------------- Wound Assessment Details Patient Name: Date of Service: Karla Hurter Price. 06/21/2023 3:15 PM Medical Record Number: [id number] Patient Account Number: [id number] Date of Birth/Sex: Treating RN: [birth year] (61 y.o. F) Primary Care : [name] Other Clinician: Referring : Treating /Extender: [name], [name] Weeks in Treatment: 9 Wound Status Wound Number: 23 Primary Diabetic Wound/Ulcer of the Lower Extremity Etiology: Wound Location: Right Metatarsal head first Wound Open Wounding Event: Gradually Appeared Status: Date Acquired: 04/18/2021 Comorbid Chronic Obstructive Pulmonary Disease (COPD), Sleep Apnea, Weeks Of Treatment: 9 History: Arrhythmia, Congestive Heart Failure, Coronary Artery Disease, Clustered Wound: No Hypertension, Peripheral Arterial Disease, Cirrhosis , Type II Diabetes, Osteoarthritis, Neuropathy Wound Measurements Length: (cm) 0.9 Width: (cm) 0.7 Depth: (cm) 0.3 Area: (cm) 0.495 Volume: (cm) 0.148 % Reduction in Area: 47.5% % Reduction in Volume: 68.6% Epithelialization:  Small (1-33%) Wound Description Classification: Grade 2 Wound Margin: Distinct, outline attached Exudate Amount: Medium Exudate Type: Serosanguineous Exudate Color: red, brown Foul Odor After Cleansing: No Slough/Fibrino Yes Wound Bed Granulation Amount: Medium (34-66%) Exposed [name], [name] ([id number]) [phone].pdf Page 5 of 10 Granulation Quality: Pink Fascia Exposed: No Necrotic Amount: Medium (34-66%) Fat Layer (Subcutaneous Tissue) Exposed: Yes Necrotic Quality: Adherent Slough Tendon Exposed: No Muscle Exposed: No Joint Exposed: No Bone Exposed: No Periwound Skin Texture Texture Color No Abnormalities Noted: No No Abnormalities Noted: Yes Callus: Yes Temperature / Pain Temperature: No Abnormality Moisture No Abnormalities Noted: Yes Treatment Notes Wound #23 (Metatarsal head first) Wound Laterality: Right Cleanser Soap and Water Discharge Instruction: May shower and wash wound with dial antibacterial soap and water prior to dressing change. Wound Cleanser Discharge Instruction: Cleanse the wound with wound cleanser prior to applying a clean dressing using gauze sponges, not tissue or cotton balls. Peri-Wound Care Sween Lotion (Moisturizing lotion) Discharge Instruction: Apply moisturizing lotion as directed Topical Primary Dressing Maxorb Extra Ag+ Alginate Dressing, 4x4.75 (in/in) Discharge Instruction: Apply to wound bed as instructed Secondary Dressing Optifoam Non-Adhesive Dressing, 4x4 in Discharge Instruction: Apply foam donut and gauze Secured With Compression Wrap Kerlix Roll 4.5x3.1 (in/yd) Discharge Instruction: Apply Kerlix and Coban compression as directed. Coban Self-Adherent Wrap 4x5 (in/yd) Discharge Instruction: Apply over Kerlix as directed. Compression Stockings Add-Ons Electronic Signature(Price) Signed: 06/22/2023  11:20:19 AM By: Karl Ito Entered By: Karl Ito on 06/21/2023  15:30:09 -------------------------------------------------------------------------------- Wound Assessment Details Patient Name: Date of Service: [name]. 06/21/2023 3:15 PM Medical Record Number: [id number] Patient Account Number: [id number] Date of Birth/Sex: Treating RN: Mar 20, 1961 (62 y.o. F) Primary Care : [name] Other Clinician: Referring : Treating /Extender: [name], [name] Weeks in Treatment: 9 Wound Status Wound Number: 24 Primary Diabetic Wound/Ulcer of the Lower Extremity Etiology: [name], [name] ([id number]) [phone].pdf Page 6 of 10 Etiology: Wound Location: Left, Lateral Foot Wound Open Wounding Event: Gradually Appeared Status: Date Acquired: 02/06/2023 Comorbid Chronic Obstructive Pulmonary Disease (COPD), Sleep Apnea, Weeks Of Treatment: 9 History: Arrhythmia, Congestive Heart Failure, Coronary Artery Disease, Clustered Wound: No Hypertension, Peripheral Arterial Disease, Cirrhosis , Type II Diabetes, Osteoarthritis, Neuropathy Photos Wound Measurements Length: (cm) 0.5 Width: (cm) 0.5 Depth: (cm) 0.1 Area: (cm) 0.196 Volume: (cm) 0.02 % Reduction in Area: 79.4% % Reduction in Volume: 78.9% Epithelialization: Small (1-33%) Wound Description Classification: Grade 1 Wound Margin: Distinct, outline attached Exudate Amount: Medium Exudate Type: Serosanguineous Exudate Color: red, brown Foul Odor After Cleansing: No Slough/Fibrino Yes Wound Bed Granulation Amount: Small (1-33%) Exposed Structure Granulation Quality: Pale Fascia Exposed: No Necrotic Amount: Large (67-100%) Fat Layer (Subcutaneous Tissue) Exposed: Yes Necrotic Quality: Eschar, Adherent Slough Tendon Exposed: No Muscle Exposed: No Joint Exposed: No Bone Exposed: No Periwound Skin Texture Texture Color No Abnormalities Noted: No No Abnormalities Noted: No Callus: Yes Atrophie Blanche: No Crepitus:  No Cyanosis: No Excoriation: No Ecchymosis: Yes Induration: No Erythema: Yes Rash: No Erythema Location: Circumferential Scarring: No Hemosiderin Staining: No Mottled: No Moisture Pallor: No No Abnormalities Noted: Yes Rubor: No Temperature / Pain Temperature: No Abnormality Treatment Notes Wound #24 (Foot) Wound Laterality: Left, Lateral Cleanser Soap and Water Discharge Instruction: May shower and wash wound with dial antibacterial soap and water prior to dressing change. Wound Cleanser Discharge Instruction: Cleanse the wound with wound cleanser prior to applying a clean dressing using gauze sponges, not tissue or cotton balls. Peri-Wound Care Sween Lotion (Moisturizing lotion) Discharge Instruction: Apply moisturizing lotion as directed [name], [name] ([id number]) [id number] Page 7 of 10 Topical Primary Dressing Maxorb Extra Ag+ Alginate Dressing, 4x4.75 (in/in) Discharge Instruction: Apply to wound bed as instructed Secondary Dressing Optifoam Non-Adhesive Dressing, 4x4 in Discharge Instruction: Apply foam donut and gauze Secured With Compression Wrap Kerlix Roll 4.5x3.1 (in/yd) Discharge Instruction: Apply Kerlix and Coban compression as directed. Coban Self-Adherent Wrap 4x5 (in/yd) Discharge Instruction: Apply over Kerlix as directed. Compression Stockings Add-Ons Electronic Signature(Price) Signed: 06/22/2023 11:20:19 AM By: Karl Ito Entered By: Karl Ito on 06/21/2023 15:32:42 -------------------------------------------------------------------------------- Wound Assessment Details Patient Name: Date of Service: Karla Bigness, DO [name]. 06/21/2023 3:15 PM Medical Record Number: [id number] Patient Account Number: [id number] Date of Birth/Sex: Treating RN: Jul 04, 1961 (62 y.o. F) Primary Care : [name] Other Clinician: Referring : Treating /Extender: [name], [name] Weeks in  Treatment: 9 Wound Status Wound Number: 26 Primary Diabetic Wound/Ulcer of the Lower Extremity Etiology: Wound Location: Left, Lateral Lower Leg Secondary Venous Leg Ulcer Wounding Event: Trauma Etiology: Date Acquired: 06/03/2023 Wound Open Weeks Of Treatment: 2 Status: Clustered Wound: No Comorbid Chronic Obstructive Pulmonary Disease (COPD), Sleep Apnea, History: Arrhythmia, Congestive Heart Failure, Coronary Artery Disease, Hypertension, Peripheral Arterial Disease, Cirrhosis , Type II Diabetes, Osteoarthritis, Neuropathy Photos Wound Measurements Length: (cm) 0 Width: (cm) 0 Depth: (cm) 0 Area: (cm) Volume: (cm) [name], [name] ([id number]) .3 % Reduction in Area: 92.5% .  3 % Reduction in Volume: 92.6% .1 Epithelialization: Small (1-33%) 0.071 0.007 [phone].pdf Page 8 of 10 Wound Description Classification: Grade 1 Wound Margin: Distinct, outline attached Exudate Amount: Medium Exudate Type: Serosanguineous Exudate Color: red, brown Foul Odor After Cleansing: No Slough/Fibrino Yes Wound Bed Granulation Amount: Medium (34-66%) Exposed Structure Granulation Quality: Red, Pink Fascia Exposed: No Necrotic Amount: Medium (34-66%) Fat Layer (Subcutaneous Tissue) Exposed: Yes Necrotic Quality: Adherent Slough Tendon Exposed: No Muscle Exposed: No Joint Exposed: No Bone Exposed: No Periwound Skin Texture Texture Color No Abnormalities Noted: No No Abnormalities Noted: No Callus: No Atrophie Blanche: No Crepitus: No Cyanosis: No Excoriation: No Ecchymosis: No Induration: No Erythema: No Rash: No Hemosiderin Staining: Yes Scarring: No Mottled: No Pallor: No Moisture Rubor: No No Abnormalities Noted: No Dry / Scaly: No Maceration: No Treatment Notes Wound #26 (Lower Leg) Wound Laterality: Left, Lateral Cleanser Soap and Water Discharge Instruction: May shower and wash wound with dial antibacterial soap and water prior to  dressing change. Wound Cleanser Discharge Instruction: Cleanse the wound with wound cleanser prior to applying a clean dressing using gauze sponges, not tissue or cotton balls. Peri-Wound Care Sween Lotion (Moisturizing lotion) Discharge Instruction: Apply moisturizing lotion as directed Topical Primary Dressing Maxorb Extra Ag+ Alginate Dressing, 4x4.75 (in/in) Discharge Instruction: Apply to wound bed as instructed Secondary Dressing Woven Gauze Sponge, Non-Sterile 4x4 in Discharge Instruction: Apply over primary dressing as directed. Secured With Compression Wrap Kerlix Roll 4.5x3.1 (in/yd) Discharge Instruction: Apply Kerlix and Coban compression as directed. Coban Self-Adherent Wrap 4x5 (in/yd) Discharge Instruction: Apply over Kerlix as directed. Compression Stockings Add-Ons Electronic Signature(Price) Signed: 06/22/2023 11:20:19 AM By: Karl Ito Entered By: Karl Ito on 06/21/2023 15:33:30 [name] ([id number]) [id number] Page 9 of 10 -------------------------------------------------------------------------------- Wound Assessment Details Patient Name: Date of Service: [name] 06/21/2023 3:15 PM Medical Record Number: [id number] Patient Account Number: [id number] Date of Birth/Sex: Treating RN: 09-04-1961 (62 y.o. F) Primary Care : [name] Other Clinician: Referring : Treating /Extender: [name], [name] Weeks in Treatment: 9 Wound Status Wound Number: 27 Primary Diabetic Wound/Ulcer of the Lower Extremity Etiology: Wound Location: Right, Anterior Lower Leg Secondary Venous Leg Ulcer Wounding Event: Trauma Etiology: Date Acquired: 06/03/2023 Wound Open Weeks Of Treatment: 2 Status: Clustered Wound: Yes Comorbid Chronic Obstructive Pulmonary Disease (COPD), Sleep Apnea, History: Arrhythmia, Congestive Heart Failure, Coronary Artery Disease, Hypertension, Peripheral  Arterial Disease, Cirrhosis , Type II Diabetes, Osteoarthritis, Neuropathy Photos Wound Measurements Length: (cm) Width: (cm) Depth: (cm) Clustered Quantity: Area: (cm) Volume: (cm) 1.5 % Reduction in Area: 14.3% 4 % Reduction in Volume: 14.4% 0.1 Epithelialization: Small (1-33%) 3 4.712 0.471 Wound Description Classification: Grade 1 Wound Margin: Distinct, outline attached Exudate Amount: Medium Exudate Type: Serosanguineous Exudate Color: red, brown Foul Odor After Cleansing: No Slough/Fibrino Yes Wound Bed Granulation Amount: Small (1-33%) Exposed Structure Granulation Quality: Red, Pink Fascia Exposed: No Necrotic Amount: Large (67-100%) Fat Layer (Subcutaneous Tissue) Exposed: Yes Necrotic Quality: Eschar, Adherent Slough Tendon Exposed: No Muscle Exposed: No Joint Exposed: No Bone Exposed: No Periwound Skin Texture Texture Color No Abnormalities Noted: No No Abnormalities Noted: No Callus: No Atrophie Blanche: No Crepitus: No Cyanosis: No Excoriation: No Ecchymosis: No Induration: No Erythema: No Rash: No Hemosiderin Staining: Yes Scarring: No Mottled: No Pallor: No Moisture Rubor: No [name], [name] ([id number]) [phone].pdf Page 10 of 10 Rubor: No No Abnormalities Noted: No Dry / Scaly: No Maceration: No Treatment Notes Wound #27 (Lower Leg) Wound Laterality: Right, Anterior Cleanser  Soap and Water Discharge Instruction: May shower and wash wound with dial antibacterial soap and water prior to dressing change. Wound Cleanser Discharge Instruction: Cleanse the wound with wound cleanser prior to applying a clean dressing using gauze sponges, not tissue or cotton balls. Peri-Wound Care Sween Lotion (Moisturizing lotion) Discharge Instruction: Apply moisturizing lotion as directed Topical Primary Dressing Maxorb Extra Ag+ Alginate Dressing, 4x4.75 (in/in) Discharge Instruction: Apply to wound bed as  instructed Secondary Dressing Woven Gauze Sponge, Non-Sterile 4x4 in Discharge Instruction: Apply over primary dressing as directed. Secured With Compression Wrap Kerlix Roll 4.5x3.1 (in/yd) Discharge Instruction: Apply Kerlix and Coban compression as directed. Coban Self-Adherent Wrap 4x5 (in/yd) Discharge Instruction: Apply over Kerlix as directed. Compression Stockings Add-Ons Electronic Signature(Price) Signed: 06/22/2023 11:20:19 AM By: Karl Ito Entered By: Karl Ito on 06/21/2023 15:31:13 -------------------------------------------------------------------------------- Vitals Details Patient Name: Date of Service: Karla Bigness, DO [name]. 06/21/2023 3:15 PM Medical Record Number: [id number] Patient Account Number: [id number] Date of Birth/Sex: Treating RN: 09-Sep-1961 (62 y.o. F) Primary Care : [name] Other Clinician: Referring : Treating /Extender: [name], [name] Weeks in Treatment: 9 Vital Signs Time Taken: 15:24 Temperature (F): 98.6 Height (in): 64 Pulse (bpm): 87 Weight (lbs): 225 Respiratory Rate (breaths/min): 18 Body Mass Index (BMI): 38.6 Blood Pressure (mmHg): 146/74 Reference Range: 80 - 120 mg / dl Electronic Signature(Price) Signed: 06/22/2023 11:20:19 AM By: Karl Ito Entered By: Karl Ito on 06/21/2023 15:24:51

## 2023-06-28 ENCOUNTER — Encounter (HOSPITAL_BASED_OUTPATIENT_CLINIC_OR_DEPARTMENT_OTHER): Payer: Medicare HMO | Admitting: Physician Assistant

## 2023-06-28 DIAGNOSIS — E11621 Type 2 diabetes mellitus with foot ulcer: Secondary | ICD-10-CM | POA: Diagnosis not present

## 2023-06-28 NOTE — Progress Notes (Signed)
[name], [name] ([id number]) [id number] Page 1 of 12 Visit Report for 06/28/2023 Chief Complaint Document Details Patient Name: Date of Service: [name] 06/28/2023 1:30 PM Medical Record Number: [id number] Patient Account Number: [id number] Date of Birth/Sex: Treating RN: [birth year] (62 y.o. F) Primary Care Provider: [name] Other Clinician: Referring Provider: Treating Provider/Extender: [name], [name] Weeks in Treatment: 10 Information Obtained from: Patient Chief Complaint Bilateral LE Ulcers Electronic Signature(s) Signed: 06/28/2023 1:59:06 PM By: Allen Derry PA-C Entered By: Allen Derry on 06/28/2023 13:59:06 -------------------------------------------------------------------------------- Debridement Details Patient Name: Date of Service: Karla Bigness, Karla [name] 06/28/2023 1:30 PM Medical Record Number: [id number] Patient Account Number: [id number] Date of Birth/Sex: Treating RN: December 26, 1960 (61 y.o. [name] Primary Care Provider: [name] Other Clinician: Referring Provider: Treating Provider/Extender: Juleen China Weeks in Treatment: 10 Debridement Performed for Assessment: Wound #23 Right Metatarsal head first Performed By: Physician Lenda Kelp, PA Debridement Type: Debridement Severity of Tissue Pre Debridement: Fat layer exposed Level of Consciousness (Pre-procedure): Awake and Alert Pre-procedure Verification/Time Out Yes - 14:35 Taken: Start Time: 14:35 Pain Control: Lidocaine 4% T opical Solution Percent of Wound Bed Debrided: 100% T Area Debrided (cm): otal 0.49 Tissue and other material debrided: Viable, Callus, Slough, Subcutaneous, Slough Level: Skin/Subcutaneous Tissue Debridement Description: Excisional Instrument: Curette Bleeding: Minimum Hemostasis Achieved: Pressure End Time: 14:37 Procedural Pain: 0 Post Procedural Pain: 0 Response to Treatment:  Procedure was tolerated well Level of Consciousness (Post- Awake and Alert procedure): Post Debridement Measurements of Total Wound Length: (cm) 0.9 Width: (cm) 0.7 Depth: (cm) 0.3 Volume: (cm) 0.148 Character of Wound/Ulcer Post Debridement: Improved [name], [name] ([id number]) [id number] Page 2 of 12 Severity of Tissue Post Debridement: Fat layer exposed Post Procedure Diagnosis Same as Pre-procedure Notes Scribed for Allen Derry PA by [employer]) Unsigned Entered By: Karie Schwalbe on 06/28/2023 14:53:43 -------------------------------------------------------------------------------- HPI Details Patient Name: Date of Service: [name] 06/28/2023 1:30 PM Medical Record Number: [id number] Patient Account Number: [id number] Date of Birth/Sex: Treating RN: 23-Dec-1960 (62 y.o. F) Primary Care Provider: [name] Other Clinician: Referring Provider: Treating Provider/Extender: [name], [name] Weeks in Treatment: 10 History of Present Illness HPI Description: This 62 year old patient who has a very long significant history of diabetes mellitus, previous alcohol and nicotine abuse, chronic data disease, COPD, diabetes mellitus, height hypertension, critical lower limb ischemia with several wounds being managed at the wound Center at [facility] for over a year. She was recently in the ER at [facility] and was referred to our center. He has had a long history of critical limb ischemia and over a period of time has had balloon angioplasties in March 2016, endarterectomy of the left carotid, lower extremity angiogram and treatment by Dr. Nanetta Batty, several cardiac catheterizations. Most recently she had an x-ray of her left foot while in the ER which showed no acute abnormality. during this ER visit she was started on ciprofloxacin and asked to continue with the wound care physicians at [facility]. Her last ABI done in June 2017 showed the right side to be 0.28 in the left side to be 0.48. Her right toe brachial index was 0.17 on the right and 0.27 on the left. her last hemoglobin A1c was 11.1% She continues to smoke about a pack of cigarettes a day. 03/28/2017 -- -- right foot x-ray -- IMPRESSION:Areas of soft tissue swelling. Mild subluxation second PIP joint. No frank  dislocation or fracture. No erosive change or bony destruction. No soft tissue ulceration or radiopaque foreign body evident by radiography. There is plantar fascia calcification with a nearby inferior calcaneal spur. There are foci of arterial vascular calcification. 04/04/2017 -- the patient continues to be noncompliant and continues to complain of a lot of pain and has not done anything about quitting smoking Readmission: 06/26/18 on evaluation today patient presents for reevaluation she has not been seen in our office since May 2018. Since she was last seen here in our office she has undergone testing at Dr. Durenda Age office where it was revealed that she had an ABI of 0.68 with her previous ABI being 0.64 and a left ABI of 0.38 with previous ABI being 0.34 this study which was performed on 04/05/18 was pretty much equivalent to the study performed on 11/22/17. The findings in the end revealed on the right would appear to be moderate right lower extremity arterial disease on the left Dr. Allyson Sabal states moderate in the report but unfortunately this appears to be much more severe at 0.38 compared to the right. Subsequently the patient was scheduled to have angiography with Dr. Allyson Sabal on 04/12/18. This was however canceled due to the fact that the patient was found to have chronic kidney disease stage III and it was to the point that he did not feel that it will be safe to pursue angiography at that point. She has not been on any antibiotics recently. At this point Dr. Allyson Sabal has not rescheduled anything as far as the  injured [place] according to the patient he is somewhat reluctant to Karla so. Nonetheless with her diminished blood flow this is gonna make it somewhat difficult for her to heal. 07/05/18; this is a patient I have not seen previously. She has very significant PAD as noted above. She apparently has had revascularization efforts by Dr. Allyson Sabal on the right on 3 occasions to the patient. She was supposed to have an attempted angiography on the left however this was canceled apparently because of stage III chronic renal failure. I will need to research all of this. She complains of significant pain in the wound and has claudication enough that when she walks to the end of the driveway she has to stop. She is been using silver alginate to the wounds on her legs and Iodoflex to the area on her left second toe. 07/13/18 on evaluation today patient's wounds actually appear to be doing about the same. She has an appointment she tells me within the next month that is September 2019 with Dr. Allyson Sabal to discuss options to see if there's anything else that you can recommend or Karla for her. Nonetheless obviously what we're trying to Karla is Karla what we can to save her leg and in turn prevent any additional worsening or damage. None in the meantime we been mainly trying to manage her ulcers as best we can. 07/27/18; some improvement in the multiplicity of wounds on her left lower extremity and foot. She's been using silver alginate. I was unable to determine that she actually has an appointment with Dr. Allyson Sabal. We are checking into this The patient's wound includes Left lateral foot, left plantar heel, her left anterior calf wound looks close to me, left fourth toe is very close to closed and the left medial calf is perhaps the largest open area READMISSION 02/19/2021 This is a now 62 year old woman with type 2 diabetes and continued cigarette smoking. She has known PAD. She  was last in this clinic in 2019 at that point  with [name], [name] ([id number]) [id number] Page 3 of 12 wounds on her left foot. She left in a nonhealed state. On 06/28/2019 I see she had a left femoropopliteal by vein and vascular with an amputation of the fifth ray. These managed to heal over. Her last arterial studies were in February 2021. This showed an absent waveform at the posterior tibial artery on the right a dampened monophasic dorsalis pedis on the right of 0.44. On the left again the PTA TA was absent her dorsalis pedis was 0.99 triphasic and her great toe pressure was 0.65. This would have been after her revascularization. Once again she tells me that Dr. Allyson Sabal has done revascularization on the right leg on 3 different occasions. She has a right transmetatarsal amputation apparently done by Dr. Lajoyce Corners remotely but I Karla not see a note for these right lower extremity revascularization but I may not of looked back far enough. In any case she says that the she has a wound on the plantar aspect of the right transmetatarsal amputation site there is been there for several months. More recently she fell and had a wound on her right medial lower leg she had 5 sutures placed in the ER and then subsequently she has developed an area on the medial lower leg which was a blister that opened up. Past medical history includes type 2 diabetes, PAD, chronic renal failure, congestive heart failure, right transmetatarsal amputation, left fifth ray amputation, continued tobacco abuse, atrial fibrillation and cirrhosis. We did not attempt an ABI on the right leg today because of pain 02/26/2021; patient we admitted to the clinic last week. She has what looks to be 2 areas on her right medial and right anterior lower leg that look more like venous wounds but she has 1 on the first met head at the base of her right transmet. She has known severe PAD. She complains of a lot of pain although some of this may be neuropathic. Is  difficult to exclude a component of claudication. Use silver alginate on the wounds on her legs and Iodoflex on the area on the first met head. She has an appointment with Dr. Allyson Sabal on 5/25 although I will text him and discussed the situation. She is have poorly controlled diabetic. She has has stage IIIb chronic renal failure 4/22; patient presents for 1 week follow-up. The 2 areas on her right medial and right anterior lower leg appear well-healing. She has been using silver alginate to this area without issues. She has a first met head ulcer and it is unclear how long this has been there as it was discovered in the ED earlier this month when she was being evaluated for another issue. Iodoflex has been used at this area. 4/29; patient presents for 1 week follow-up. She has been using silver alginate to the leg wounds and Iodoflex to the plantar foot wound. She has had this wrapped with Coban and Kerlix. She has no concerns or complaints today. 5/6; this is a difficult wound on the right plantar foot transmit site. We are not making a lot of progress. She had 2 more venous looking wounds on the right medial and right anterior lower leg 1 of which is healed. Her appointment with Dr. Allyson Sabal is on 5/25 5/16; she did not tolerate the offloading shoe we gave her in fact she had a fall with an abrasion on her right forearm she is now  back in the modified small shoe which does not offload her foot properly. Her appoint with Dr. Allyson Sabal is still on 5/25 we have been using Iodoflex. The area on the right anterior lower leg is healed 7/18; patient presents for follow-up however has not been seen in 2 months. She was last seen at the end of May. She had a fall at the end of June and was hospitalized. She has been unable to follow-up since then. For the wound she has only been keeping it covered with gauze. She is scheduled for an aortogram with lower extremity runoff at the end of July. 7/29; patient presents  for 2-week follow-up. She has home health and they have been changing her dressings. She denies any issues and has no complaints today. She states she had a procedure where they opened up one of her vessels in her legs. She denies signs of infection. 8/5; patient presents for follow-up. She was recently in the hospital for bradycardia. She was noted to have cellulitis to the left lower extremity and Unna boot was placed due to blisters and increased swelling. She reports improvement to her left leg since being in the hospital and stability check her right plantar foot wound. She denies signs of infection. 8/23; since I last saw this patient a lot has happened. She still has the area over the right first met head in the setting of previous transmetatarsal amputation. Firstly most importantly she underwent revascularization of her occluded right SFA. She underwent directional atherectomy followed by drug-coated balloon angioplasty. She has no named vessel below the knee hopefully the revascularization of the right SFA will improve collateral flow. It is not felt however that she has any endovascular options. She had follow-up arterial studies noninvasive on 8/9. These showed an ABI of 0.44 at the right PTA. Monophasic waveforms. On the left her great toe ABI was 0.68. Her follow-up arterial Doppler showed a 50 to 74% stenosis in the proximal SFA and a 50 to 74% stenosis in the distal SFA mild to severe atherosclerosis noted throughout the extremity. Areas of shadowing plaque seen, unable to rule out higher grade stenosis she also had a fall apparently wearing a right foot forefoot off loader that we gave her. She therefore comes in in an ordinary running shoe today. Not certain if this is the fall that ended up in the hospital with bradycardia 9/6 area over the right first metatarsal head in the setting of her previous amputation and severe PAD. She is also a diabetic with known PAD status post attempt  at revascularization. She is continued smoker Again the separation of visits in the clinic is somewhat disturbing if we are going to consider her for a total contact cast. She is not wearing anything to offload this stating it causes imbalance especially the forefoot off loader. She basically comes in in bedroom slippers She also had a fall this morning about an hour ago. She has a skin tear on the left dorsal forearm 9/13; areas over the right first metatarsal head in the setting of previous TMA and severe PAD. Again she comes in here in slippers. We have been using Hydrofera Blue. We use MolecuLight on this which was essentially negative study 9/20; right first metatarsal head in the setting of her previous TMA and severe PAD. Same slipper type shoes. She says she cannot wear a forefoot off loader and for some reason she will not wear surgical shoes. She says she is smoking half a pack per  day. 9/30; right first metatarsal head. Absolutely no improvement in fact there is tunneling superiorly very close to bone. She does not offload this properly at all. We use MolecuLight on this 2 weeks ago that did not show any surface bacteria we have been using silver collagen is a dressing She tells me she is down to 3 cigarettes a day I have offered encouragement and a treatment plan 10/6; right first metatarsal head. Exposed bone this week which is not surprising. We have been using silver collagen. She is smoking 5 to 10 cigarettes a day 10/17; she has not been here in 10 days. The bone scraping that I did on 10/6 showed staph lugdunensis, staph epidermidis and Pseudomonas Alcalifenes. I put in for Septra DS 1 p.o. twice daily for 14 days last week would be but we could not reach her to start the antibiotic. Quinolones would have been a good alternative except she has multiple drug interactions. Septra would not cover what ever the Pseudomonas is but I am not really sure of the relevance here. I am going to  try her on the Septra for 2 weeks. She has a probing wound on the right TMA that probes to bone. She claims to have just about stop smoking I reviewed her arterial status. She underwent a left fem-tib bypass by Dr. Arbie Cookey in 06/28/2019. Her last angiogram was in July of this year by Dr. Allyson Sabal. This showed an occluded right SFA the popliteal and tibial vessels were also occluded the peroneal vessel filled by collaterals and filled the PT and DP by communicating collaterals. She was not felt to have any additional and vascular options. Dr. Allyson Sabal noted that she he had previously revascularized her right SFA 3 times. 10/25; she is tolerating the Septra but says it makes her nauseated. I am going to continue this for an additional 2 weeks perhaps for a total of 6 weeks. Her wound does not look too much different. There is on the right first met metatarsal head. There is probing areas around the tissue that is in the middle of the wound. There is no purulence I cannot feel bone. The original source was for a bone scraping. 11/8; right first metatarsal head. This does not probe to bone and there is no purulence. As far as I can tell she is completed the Septra although there were problems with exact length of time and I think compliance. In any case I gave her 6 weeks. I have reviewed her PAD above. 11/15; right first metatarsal head. Again protruding subcutaneous tissue but this does not have any adherence to surrounding skin. Undermining circumferentially. There is no palpable bone. Not grossly purulent. We have been using Iodoflex to try to get some adherence to clean off the surface but no real improvement. I Karla not think they actually had enough supplies listening to the patient. She completed the 6 weeks of Septra I gave her, but I am not sure about the compliance with the dates i.e. may have missed days taken 1 a day etc. The patient has a vascular follow-up with Dr. Allyson Sabal this coming Friday i.e. in 3  days. By my read of arterial evaluations I Karla not think she is felt to be a [name], [name] ([id number]) [id number] Page 4 of 12 candidate for any further revascularization. The last angiogram was in July. 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 She continues to smoke now up to 10 cigarettes a day. She is not  in any pain. Wound has not improved but is certainly not worsened. I gave her 6 weeks of trimethoprim/sulfamethoxazole which she is completed in some fashion. 11/29 right first metatarsal head previous TMA. Wound actually looks somewhat better today still a lot of callus around the wound on debris on the wound surface. UNFORTUNATELY the patient missed her appointment with Dr. Allyson Sabal and is not rebooked apparently until sometime in February 12/13; right first metatarsal wound actually looks some smaller today. I did not debride this today. She is using Iodoflex. When she came in last week she had a large abscess on her left anterior lower leg apparently after falling we send her to the ER to have this drained which was done. I cannot see any cultures x-ray was negative. She was apparently given 2 doses of IV antibiotics and discharged on Bactrim DS twice daily which she is still taking. As mentioned I cannot see any cultures. 12/29; culture of the abscess on the left leg I did last time showed Serratia. We gave her antibiotics. I note she was in the ER next day with bleeding which they controlled she is on anticoagulants.She comes in today to clinic and the area on her right plantar foot is miraculously very hyperkeratotic but healed 02/06/2023 [name]. Brayleigh Blattel is a 62 year old female with a past medical history of uncontrolled type 2 diabetes with TMA to the right foot and fifth ray amputation to left foot. She reports a callus to the right first met head and on the lateral aspect of the left foot. She saw podiatry 3 days ago and had the callus  debrided. There are no open wounds at this time. She follows up with podiatry in 2 weeks. Readmission: 04-19-2023 upon evaluation today patient appears to be doing well all things considered in regard to her wounds. She does have several wounds however on her feet 1 on the left foot centrally along the lateral edge and then a wound on the right lower leg more posterior. On the right foot plantar distal at the transmetatarsal amputation site this is the worst. With that being said she tells me this has been going on for quite some time she has been seen by podiatry they have done some debridements in the past. With that being said based on what we are seeing I believe that the patient's primary issue on the right side is that she is having some issues here with her arterial flow. The ABI on the right is 0.57 on the left is 1.04. With that being said this reading on the right is getting necessitated further evaluation by vascular. She tells me she has been seen by Dr. Gery Pray in the past and he stated there was nothing further to be done for the right leg. With that being said I think that it would be good to have a second opinion with vascular on this. Patient does have a history of diabetes mellitus type 2, peripheral vascular disease, and hypertension. The previous ABIs were performed on January 11, 2023. 04-26-2023 upon evaluation today patient appears to be doing about the same in regard to her wounds. She still has not had the arterial study at this point. I think this is something needs to be done as quickly as possible. Fortunately I Karla not see any evidence of active infection locally or systemically which is great news. No fevers, chills, nausea, vomiting, or diarrhea. 7/24; the patient has not been here in about a month and a half. She  has open wounds on the plantar right foot second metatarsal head in the setting of a previous TMA, she has a new area on the left lateral foot and perhaps  traumatic areas on the right anterior and left lateral lower legs. She has poorly controlled edema. The patient has known severe PAD has had multiple interventions in the right SFA. 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 She had an pressure of 60 at the dorsalis pedis giving her an ABI of 0.57 monophasic waveforms here. On the left her study was a lot better with an ABI 1.04 triphasic waveforms. The ABI might be falsely elevated on the left. I think at her last angiogram she had occluded right SFA popliteal and tibial vessels with the peroneal vessel being the only vessel feeding her foot via collaterals. She comes in this time in basic over-the-counter shoes 06-14-2023 upon evaluation today patient appears to be doing well currently in regard to her wounds all things considered except for the left lateral foot where she has what appears to be a blister this is erythematous as well and concerned about infection. I discussed with her today that I Karla think we need to see about doing the Vi core culture and subsequently depending on the results of this we will initiate antibiotic treatment as necessary. With that being said right now I think that she is going to need to be aggressive and elevating her legs she has an appointment with vascular coming up around 3 August is what I believe her appointment date is. Nonetheless once we get clearance from the standpoint of vascular she does have good blood flow and we can subsequently try to see what we Karla about getting her edema under much better control and then she really needs some much stronger compression than what we currently are able to Karla. 06-21-2023 upon evaluation today patient appears to be doing pretty well currently in regard to her wounds most of which seem to be showing signs of improvement. She has been require little bit of  debridement in regard to the right plantar foot wound I will Karla this as carefully as possible as we are still working on establishing that she has sufficient blood flow at this point. This right side on previous testing seem to be somewhat questionable. Fortunately I Karla not see any signs of active infection locally nor systemically at this time. 06-28-2023 patient does have an appointment with vascular on 07-11-2023. With that being said right now she has been having some slow progress but still nonetheless good progress at this point. I am actually very pleased with where we stand. I Karla not see any signs of active infection locally or systemically which is great news. No fevers, chills, nausea, vomiting, or diarrhea. Electronic Signature(s) Signed: 06/28/2023 3:09:06 PM By: Allen Derry PA-C Entered By: Allen Derry on 06/28/2023 15:09:06 -------------------------------------------------------------------------------- Physical Exam Details Patient Name: Date of Service: Karla LEY, Karla [name] 06/28/2023 1:30 PM Medical Record Number: [id number] Patient Account Number: [id number] Date of Birth/Sex: Treating RN: [name] 24, 1962 (61 y.o. F) Primary Care Provider: [name] Other Clinician: Referring Provider: Treating Provider/Extender: [name], [name] Weeks in Treatment: [address] ([id number]) [id number] Page 5 of 12 Constitutional Well-nourished and well-hydrated in no acute distress. Respiratory normal breathing without difficulty. Psychiatric this patient is able to make decisions and demonstrates good insight into  disease process. Alert and Oriented x 3. pleasant and cooperative. Notes Upon evaluation patient's wounds actually appear to be doing excellent in fact most of the wounds were healed except for on the right plantar foot this is still open and on the left fifth toe there was a little area that I think is probably healed but  nonetheless we can monitor this for 1 more week before completely closing this out. She is in agreement with that plan. Nonetheless I am extremely pleased that this does seem to be doing well for her. Electronic Signature(s) Signed: 06/28/2023 3:09:37 PM By: Allen Derry PA-C Entered By: Allen Derry on 06/28/2023 15:09:37 -------------------------------------------------------------------------------- Physician Orders Details Patient Name: Date of Service: Karla Bigness, Karla [name] 06/28/2023 1:30 PM Medical Record Number: [id number] Patient Account Number: [id number] Date of Birth/Sex: Treating RN: 03-Aug-1961 (61 y.o. [name] Primary Care Provider: [name] Other Clinician: Referring Provider: Treating Provider/Extender: [name], [name] Weeks in Treatment: 10 Verbal / Phone Orders: No Diagnosis Coding ICD-10 Coding Code Description 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.522 Non-pressure chronic ulcer of other part of left foot with fat layer exposed L97.512 Non-pressure chronic ulcer of other part of right foot with fat layer exposed E11.622 Type 2 diabetes mellitus with other skin ulcer L97.812 Non-pressure chronic ulcer of other part of right lower leg with fat layer exposed I73.89 Other specified peripheral vascular diseases I87.331 Chronic venous hypertension (idiopathic) with ulcer and inflammation of right lower extremity Follow-up Appointments ppointment in 1 week. Allen Derry PA Wednesday Room 7 or 9 07/05/23 at 1:30pm (already scheduled) Return A 07/12/23 at 2:45pm Rm 9 Other: - Remember to go to Vascular and Vein appointment 07/11/23 Anesthetic (In clinic) Topical Lidocaine 5% applied to wound bed Bathing/ Shower/ Hygiene May shower with protection but Karla not get wound dressing(s) wet. Protect dressing(s) with water repellant cover (for example, large plastic bag) or a cast cover and may then take shower. Edema Control - Lymphedema /  SCD / Other Bilateral Lower Extremities Elevate legs to the level of the heart or above for 30 minutes daily and/or when sitting for 3-4 times a day throughout the day. Avoid standing for long periods of time. Patient to wear own compression stockings every day. Exercise regularly - As tolerated Moisturize legs daily. Other Edema Control Orders/Instructions: - Recommend Compression stocking 15-11mm/Hg. Brochure for Elastic Therapy with printed leg measurements. Wound Treatment Wound #23 - Metatarsal head first Wound Laterality: Right Cleanser: Soap and Water 1 x Per Week/30 Days Discharge Instructions: May shower and wash wound with dial antibacterial soap and water prior to dressing change. [name], [name] ([id number]) [id number] Page 6 of 12 Cleanser: Wound Cleanser 1 x Per Week/30 Days Discharge Instructions: Cleanse the wound with wound cleanser prior to applying a clean dressing using gauze sponges, not tissue or cotton balls. Peri-Wound Care: Sween Lotion (Moisturizing lotion) 1 x Per Week/30 Days Discharge Instructions: Apply moisturizing lotion as directed Prim Dressing: Maxorb Extra Ag+ Alginate Dressing, 4x4.75 (in/in) 1 x Per Week/30 Days ary Discharge Instructions: Apply to wound bed as instructed Secondary Dressing: Optifoam Non-Adhesive Dressing, 4x4 in 1 x Per Week/30 Days Discharge Instructions: Apply foam donut and gauze Compression Wrap: Kerlix Roll 4.5x3.1 (in/yd) 1 x Per Week/30 Days Discharge Instructions: Apply Kerlix and Coban compression as directed. Compression Wrap: Coban Self-Adherent Wrap 4x5 (in/yd) 1 x Per Week/30 Days Discharge Instructions: Apply over Kerlix as directed. Wound #28 - T Fourth oe Wound Laterality:  Left Cleanser: Soap and Water 3 x Per Week/30 Days Discharge Instructions: May shower and wash wound with dial antibacterial soap and water prior to dressing change. Cleanser: Wound Cleanser 3 x Per Week/30  Days Discharge Instructions: Cleanse the wound with wound cleanser prior to applying a clean dressing using gauze sponges, not tissue or cotton balls. Prim Dressing: Maxorb Extra Ag+ Alginate Dressing, 2x2 (in/in) 3 x Per Week/30 Days ary Discharge Instructions: Apply to wound bed as instructed Prim Dressing: Bandaid 3 x Per Week/30 Days ary Discharge Instructions: or Foam Border Secondary Dressing: Bordered Gauze, 2x2 in 3 x Per Week/30 Days Discharge Instructions: Apply over primary dressing as directed. Electronic Signature(s) Unsigned Entered By: Karie Schwalbe on 06/28/2023 14:50:43 -------------------------------------------------------------------------------- Problem List Details Patient Name: Date of Service: [name]. 06/28/2023 1:30 PM Medical Record Number: [id number] Patient Account Number: [id number] Date of Birth/Sex: Treating RN: [birth year] (62 y.o. F) Primary Care Provider: [name] Other Clinician: Referring Provider: Treating Provider/Extender: [name], [name] Weeks in Treatment: 10 Active Problems ICD-10 Encounter Code Description Active Date MDM Diagnosis 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 2 diabetes mellitus with foot ulcer 04/19/2023 No Yes L97.522 Non-pressure chronic ulcer of other part of left foot with fat layer exposed 04/19/2023 No Yes L97.512 Non-pressure chronic ulcer of other part of right foot with fat layer exposed 04/19/2023 No Yes [name], [name] ([id number]) [id number] Page 7 of 12 E11.622 Type 2 diabetes mellitus with other skin ulcer 04/19/2023 No Yes L97.812 Non-pressure chronic ulcer of other part of right lower leg with fat layer 04/19/2023 No Yes exposed I73.89 Other specified peripheral vascular diseases 04/19/2023 No Yes I87.331 Chronic venous hypertension (idiopathic) with ulcer and inflammation of right 04/19/2023 No Yes lower extremity Inactive Problems Resolved Problems Electronic Signature(s) Signed:  06/28/2023 1:58:51 PM By: Allen Derry PA-C Entered By: Allen Derry on 06/28/2023 13:58:50 -------------------------------------------------------------------------------- Progress Note Details Patient Name: Date of Service: Karla LEY, Karla [name] 06/28/2023 1:30 PM Medical Record Number: [id number] Patient Account Number: [id number] Date of Birth/Sex: Treating RN: 23-Jan-1961 (61 y.o. F) Primary Care Provider: [name] Other Clinician: Referring Provider: Treating Provider/Extender: [name], [name] Weeks in Treatment: 10 Subjective Chief Complaint Information obtained from Patient Bilateral LE Ulcers History of Present Illness (HPI) This 63 year old patient who has a very long significant history of diabetes mellitus, previous alcohol and nicotine abuse, chronic data disease, COPD, diabetes mellitus, height hypertension, critical lower limb ischemia with several wounds being managed at the wound Center at [facility] for over a year. She was recently in the ER at [facility] and was referred to our center. He has had a long history of critical limb ischemia and over a period of time has had balloon angioplasties in March 2016, endarterectomy of the left carotid, lower extremity angiogram and treatment by Dr. Nanetta Batty, several cardiac catheterizations. Most recently she had an x-ray of her left foot while in the ER which showed no acute abnormality. during this ER visit she was started on ciprofloxacin and asked to continue with the wound care physicians at [facility]. Her last ABI done in June 2017 showed the right side to be 0.28 in the left side to be 0.48. Her right toe brachial index was 0.17 on the right and 0.27 on the left. her last hemoglobin A1c was 11.1% She continues to smoke about a pack of cigarettes a day. 03/28/2017 -- -- right foot x-ray -- IMPRESSION:Areas of soft tissue swelling. Mild subluxation second PIP joint.  No frank dislocation or  fracture. No erosive change or bony destruction. No soft tissue ulceration or radiopaque foreign body evident by radiography. There is plantar fascia calcification with a nearby inferior calcaneal spur. There are foci of arterial vascular calcification. 04/04/2017 -- the patient continues to be noncompliant and continues to complain of a lot of pain and has not done anything about quitting smoking Readmission: 06/26/18 on evaluation today patient presents for reevaluation she has not been seen in our office since May 2018. Since she was last seen here in our office she has undergone testing at Dr. Durenda Age office where it was revealed that she had an ABI of 0.68 with her previous ABI being 0.64 and a left ABI of 0.38 with previous ABI being 0.34 this study which was performed on 04/05/18 was pretty much equivalent to the study performed on 11/22/17. The findings in the end revealed on the right would appear to be moderate right lower extremity arterial disease on the left Dr. Allyson Sabal states moderate in the report but unfortunately this appears to be much more severe at 0.38 compared to the right. Subsequently the patient was scheduled to have angiography with Dr. Allyson Sabal on 04/12/18. This was however canceled due to the fact that the patient was found to have chronic kidney disease stage III and it was to the point that he did not feel that it will be safe to pursue angiography at that point. She has not been on any antibiotics recently. At this point Dr. Allyson Sabal has not rescheduled anything as far as the injured [place] according to the patient he is somewhat reluctant to SHAHEEN, SUCHANEK ([id number]) [id number] Page 8 of 12 Karla so. Nonetheless with her diminished blood flow this is gonna make it somewhat difficult for her to heal. 07/05/18; this is a patient I have not seen previously. She has very significant PAD as noted above. She apparently has had revascularization efforts by  Dr. Allyson Sabal on the right on 3 occasions to the patient. She was supposed to have an attempted angiography on the left however this was canceled apparently because of stage III chronic renal failure. I will need to research all of this. She complains of significant pain in the wound and has claudication enough that when she walks to the end of the driveway she has to stop. She is been using silver alginate to the wounds on her legs and Iodoflex to the area on her left second toe. 07/13/18 on evaluation today patient's wounds actually appear to be doing about the same. She has an appointment she tells me within the next month that is September 2019 with Dr. Allyson Sabal to discuss options to see if there's anything else that you can recommend or Karla for her. Nonetheless obviously what we're trying to Karla is Karla what we can to save her leg and in turn prevent any additional worsening or damage. None in the meantime we been mainly trying to manage her ulcers as best we can. 07/27/18; some improvement in the multiplicity of wounds on her left lower extremity and foot. She's been using silver alginate. I was unable to determine that she actually has an appointment with Dr. Allyson Sabal. We are checking into this The patient's wound includes Left lateral foot, left plantar heel, her left anterior calf wound looks close to me, left fourth toe is very close to closed and the left medial calf is perhaps the largest open area READMISSION 02/19/2021 This is a now 62 year old woman with  type 2 diabetes and continued cigarette smoking. She has known PAD. She was last in this clinic in 2019 at that point with wounds on her left foot. She left in a nonhealed state. On 06/28/2019 I see she had a left femoropopliteal by vein and vascular with an amputation of the fifth ray. These managed to heal over. Her last arterial studies were in February 2021. This showed an absent waveform at the posterior tibial artery on the right a dampened  monophasic dorsalis pedis on the right of 0.44. On the left again the PTA TA was absent her dorsalis pedis was 0.99 triphasic and her great toe pressure was 0.65. This would have been after her revascularization. Once again she tells me that Dr. Allyson Sabal has done revascularization on the right leg on 3 different occasions. She has a right transmetatarsal amputation apparently done by Dr. Lajoyce Corners remotely but I Karla not see a note for these right lower extremity revascularization but I may not of looked back far enough. In any case she says that the she has a wound on the plantar aspect of the right transmetatarsal amputation site there is been there for several months. More recently she fell and had a wound on her right medial lower leg she had 5 sutures placed in the ER and then subsequently she has developed an area on the medial lower leg which was a blister that opened up. Past medical history includes type 2 diabetes, PAD, chronic renal failure, congestive heart failure, right transmetatarsal amputation, left fifth ray amputation, continued tobacco abuse, atrial fibrillation and cirrhosis. We did not attempt an ABI on the right leg today because of pain 02/26/2021; patient we admitted to the clinic last week. She has what looks to be 2 areas on her right medial and right anterior lower leg that look more like venous wounds but she has 1 on the first met head at the base of her right transmet. She has known severe PAD. She complains of a lot of pain although some of this may be neuropathic. Is difficult to exclude a component of claudication. Use silver alginate on the wounds on her legs and Iodoflex on the area on the first met head. She has an appointment with Dr. Allyson Sabal on 5/25 although I will text him and discussed the situation. She is have poorly controlled diabetic. She has has stage IIIb chronic renal failure 4/22; patient presents for 1 week follow-up. The 2 areas on her right medial and right  anterior lower leg appear well-healing. She has been using silver alginate to this area without issues. She has a first met head ulcer and it is unclear how long this has been there as it was discovered in the ED earlier this month when she was being evaluated for another issue. Iodoflex has been used at this area. 4/29; patient presents for 1 week follow-up. She has been using silver alginate to the leg wounds and Iodoflex to the plantar foot wound. She has had this wrapped with Coban and Kerlix. She has no concerns or complaints today. 5/6; this is a difficult wound on the right plantar foot transmit site. We are not making a lot of progress. She had 2 more venous looking wounds on the right medial and right anterior lower leg 1 of which is healed. Her appointment with Dr. Allyson Sabal is on 5/25 5/16; she did not tolerate the offloading shoe we gave her in fact she had a fall with an abrasion on her right forearm  she is now back in the modified small shoe which does not offload her foot properly. Her appoint with Dr. Allyson Sabal is still on 5/25 we have been using Iodoflex. The area on the right anterior lower leg is healed 7/18; patient presents for follow-up however has not been seen in 2 months. She was last seen at the end of May. She had a fall at the end of June and was hospitalized. She has been unable to follow-up since then. For the wound she has only been keeping it covered with gauze. She is scheduled for an aortogram with lower extremity runoff at the end of July. 7/29; patient presents for 2-week follow-up. She has home health and they have been changing her dressings. She denies any issues and has no complaints today. She states she had a procedure where they opened up one of her vessels in her legs. She denies signs of infection. 8/5; patient presents for follow-up. She was recently in the hospital for bradycardia. She was noted to have cellulitis to the left lower extremity and Unna boot was  placed due to blisters and increased swelling. She reports improvement to her left leg since being in the hospital and stability check her right plantar foot wound. She denies signs of infection. 8/23; since I last saw this patient a lot has happened. She still has the area over the right first met head in the setting of previous transmetatarsal amputation. Firstly most importantly she underwent revascularization of her occluded right SFA. She underwent directional atherectomy followed by drug-coated balloon angioplasty. She has no named vessel below the knee hopefully the revascularization of the right SFA will improve collateral flow. It is not felt however that she has any endovascular options. She had follow-up arterial studies noninvasive on 8/9. These showed an ABI of 0.44 at the right PTA. Monophasic waveforms. On the left her great toe ABI was 0.68. Her follow-up arterial Doppler showed a 50 to 74% stenosis in the proximal SFA and a 50 to 74% stenosis in the distal SFA mild to severe atherosclerosis noted throughout the extremity. Areas of shadowing plaque seen, unable to rule out higher grade stenosis she also had a fall apparently wearing a right foot forefoot off loader that we gave her. She therefore comes in in an ordinary running shoe today. Not certain if this is the fall that ended up in the hospital with bradycardia 9/6 area over the right first metatarsal head in the setting of her previous amputation and severe PAD. She is also a diabetic with known PAD status post attempt at revascularization. She is continued smoker Again the separation of visits in the clinic is somewhat disturbing if we are going to consider her for a total contact cast. She is not wearing anything to offload this stating it causes imbalance especially the forefoot off loader. She basically comes in in bedroom slippers She also had a fall this morning about an hour ago. She has a skin tear on the left dorsal  forearm 9/13; areas over the right first metatarsal head in the setting of previous TMA and severe PAD. Again she comes in here in slippers. We have been using Hydrofera Blue. We use MolecuLight on this which was essentially negative study 9/20; right first metatarsal head in the setting of her previous TMA and severe PAD. Same slipper type shoes. She says she cannot wear a forefoot off loader and for some reason she will not wear surgical shoes. She says she is smoking half  a pack per day. 9/30; right first metatarsal head. Absolutely no improvement in fact there is tunneling superiorly very close to bone. She does not offload this properly at all. We use MolecuLight on this 2 weeks ago that did not show any surface bacteria we have been using silver collagen is a dressing She tells me she is down to 3 cigarettes a day I have offered encouragement and a treatment plan 10/6; right first metatarsal head. Exposed bone this week which is not surprising. We have been using silver collagen. She is smoking 5 to 10 cigarettes a day 10/17; she has not been here in 10 days. The bone scraping that I did on 10/6 showed staph lugdunensis, staph epidermidis and Pseudomonas Alcalifenes. I put in for Septra DS 1 p.o. twice daily for 14 days last week would be but we could not reach her to start the antibiotic. Quinolones would have been a good TREVIA, RENDEROS ([id number]) [id number] Page 9 of 12 alternative except she has multiple drug interactions. Septra would not cover what ever the Pseudomonas is but I am not really sure of the relevance here. I am going to try her on the Septra for 2 weeks. She has a probing wound on the right TMA that probes to bone. She claims to have just about stop smoking I reviewed her arterial status. She underwent a left fem-tib bypass by Dr. Arbie Cookey in 06/28/2019. Her last angiogram was in July of this year by Dr. Allyson Sabal. This showed an occluded right SFA the  popliteal and tibial vessels were also occluded the peroneal vessel filled by collaterals and filled the PT and DP by communicating collaterals. She was not felt to have any additional and vascular options. Dr. Allyson Sabal noted that she he had previously revascularized her right SFA 3 times. 10/25; she is tolerating the Septra but says it makes her nauseated. I am going to continue this for an additional 2 weeks perhaps for a total of 6 weeks. Her wound does not look too much different. There is on the right first met metatarsal head. There is probing areas around the tissue that is in the middle of the wound. There is no purulence I cannot feel bone. The original source was for a bone scraping. 11/8; right first metatarsal head. This does not probe to bone and there is no purulence. As far as I can tell she is completed the Septra although there were problems with exact length of time and I think compliance. In any case I gave her 6 weeks. I have reviewed her PAD above. 11/15; right first metatarsal head. Again protruding subcutaneous tissue but this does not have any adherence to surrounding skin. Undermining circumferentially. There is no palpable bone. Not grossly purulent. We have been using Iodoflex to try to get some adherence to clean off the surface but no real improvement. I Karla not think they actually had enough supplies listening to the patient. She completed the 6 weeks of Septra I gave her, but I am not sure about the compliance with the dates i.e. may have missed days taken 1 a day etc. The patient has a vascular follow-up with Dr. Allyson Sabal this coming Friday i.e. in 3 days. By my read of arterial evaluations I Karla not think she is felt to be a candidate for any further revascularization. The last angiogram was in July. 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 She continues to smoke now up to 10 cigarettes a day. She  is not in any pain. Wound has not improved but is certainly  not worsened. I gave her 6 weeks of trimethoprim/sulfamethoxazole which she is completed in some fashion. 11/29 right first metatarsal head previous TMA. Wound actually looks somewhat better today still a lot of callus around the wound on debris on the wound surface. UNFORTUNATELY the patient missed her appointment with Dr. Allyson Sabal and is not rebooked apparently until sometime in February 12/13; right first metatarsal wound actually looks some smaller today. I did not debride this today. She is using Iodoflex.  When she came in last week she had a large abscess on her left anterior lower leg apparently after falling we send her to the ER to have this drained which was done. I cannot see any cultures x-ray was negative. She was apparently given 2 doses of IV antibiotics and discharged on Bactrim DS twice daily which she is still taking. As mentioned I cannot see any cultures. 12/29; culture of the abscess on the left leg I did last time showed Serratia. We gave her antibiotics. I note she was in the ER next day with bleeding which they controlled she is on anticoagulants.She comes in today to clinic and the area on her right plantar foot is miraculously very hyperkeratotic but healed 02/06/2023 [name]. 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 She reports a callus to the right first met head and on the lateral aspect of the left foot. She saw podiatry 3 days ago and had the callus debrided. There are no open wounds at this time. She follows up with podiatry in 2 weeks. Readmission: 04-19-2023 upon evaluation today patient appears to be doing well all things considered in regard to her wounds. She does have several wounds however on her feet 1 on the left foot centrally along the lateral edge and then a wound on the right lower leg more posterior. 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 With that being said she tells me this has been going on for quite some time she has been seen by podiatry they have done some debridements in the past. With that being said based on what we are seeing I believe that the patient's primary issue on the right side is that she is having some issues here with her arterial flow. The ABI on the right is 0.57 on the left is 1.04. With that being said this reading on the right is getting necessitated further evaluation by vascular. She tells me she has been seen by Dr. Gery Pray in the past and he stated there was nothing further to be done for the right leg. With that being said I think that it would be good to have a second opinion with vascular on this. Patient does have a history of diabetes mellitus type 2, peripheral vascular disease, and hypertension. The previous ABIs were performed on January 11, 2023. 04-26-2023 upon evaluation today patient appears to be doing about the same in regard to her wounds. She still has not had the arterial study at this point. I think this is something needs to be done as quickly as possible. Fortunately I Karla not see any evidence of active infection locally or systemically which is great news. No fevers, chills, nausea, vomiting, or diarrhea. 7/24; the patient has not been here in about a month  and a half. She has open wounds on the plantar right foot second metatarsal head in the setting of a previous TMA, she has a new area on the left lateral foot and perhaps traumatic areas on the right anterior and left lateral lower legs. She has poorly controlled edema. The patient has known severe PAD has had multiple interventions in the right SFA. 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 She had an pressure of 60 at the dorsalis pedis giving her an ABI of  0.57 monophasic waveforms here. On the left her study was a lot better with an ABI 1.04 triphasic waveforms. The ABI might be falsely elevated on the left. I think at her last angiogram she had occluded right SFA popliteal and tibial vessels with the peroneal vessel being the only vessel feeding her foot via collaterals. She comes in this time in basic over-the-counter shoes 06-14-2023 upon evaluation today patient appears to be doing well currently in regard to her wounds all things considered except for the left lateral foot where she has what appears to be a blister this is erythematous as well and concerned about infection. I discussed with her today that I Karla think we need to see about doing the Vi core culture and subsequently depending on the results of this we will initiate antibiotic treatment as necessary. With that being said right now I think that she is going to need to be aggressive and elevating her legs she has an appointment with vascular coming up around 3 August is what I believe her appointment date is. Nonetheless once we get clearance from the standpoint of vascular she does have good blood flow and we can subsequently try to see what we Karla about getting her edema under much better control and then she really needs some much stronger compression than what we currently are able to Karla. 06-21-2023 upon evaluation today patient appears to be doing pretty well currently in regard to her wounds most of which seem to be showing signs of improvement. She has been require little bit of debridement in regard to the right plantar foot wound I will Karla this as carefully as possible as we are still working on establishing that she has sufficient blood flow at this point. This right side on previous testing seem to be somewhat questionable. Fortunately I Karla not see any signs of active infection locally nor systemically at this time. 06-28-2023 patient does have an appointment with vascular on  07-11-2023. With that being said right now she has been having some slow progress but still nonetheless good progress at this point. I am actually very pleased with where we stand. I Karla not see any signs of active infection locally or systemically which is great news. No fevers, chills, nausea, vomiting, or diarrhea. TWANDA, ESCHBACH ([id number]) [id number] Page 10 of 12 Objective Constitutional Well-nourished and well-hydrated in no acute distress. Vitals Time Taken: 8:14 AM, Height: 64 in, Weight: 225 lbs, BMI: 38.6, Temperature: 98.6 F, Pulse: 80 bpm, Respiratory Rate: 18 breaths/min, Blood Pressure: 127/77 mmHg. Respiratory normal breathing without difficulty. Psychiatric this patient is able to make decisions and demonstrates good insight into disease process. Alert and Oriented x 3. pleasant and cooperative. General Notes: Upon evaluation patient's wounds actually appear to be doing excellent in fact most of the wounds were healed except for on the right plantar foot this is  still open and on the left fifth toe there was a little area that I think is probably healed but nonetheless we can monitor this for 1 more week before completely closing this out. She is in agreement with that plan. Nonetheless I am extremely pleased that this does seem to be doing well for her. Integumentary (Hair, Skin) Wound #23 status is Open. Original cause of wound was Gradually Appeared. The date acquired was: 04/18/2021. The wound has been in treatment 10 weeks. The wound is located on the Right Metatarsal head first. The wound measures 0.9cm length x 0.7cm width x 0.3cm depth; 0.495cm^2 area and 0.148cm^3 volume. There is Fat Layer (Subcutaneous Tissue) exposed. There is no tunneling or undermining noted. There is a medium amount of serosanguineous drainage noted. The wound margin is distinct with the outline attached to the wound base. There is no granulation within the wound bed. There  is a large (67- 100%) amount of necrotic tissue within the wound bed including Adherent Slough. The periwound skin appearance had no abnormalities noted for moisture. The periwound skin appearance had no abnormalities noted for color. The periwound skin appearance exhibited: Callus. Periwound temperature was noted as No Abnormality. Wound #24 status is Healed - Epithelialized. Original cause of wound was Gradually Appeared. The date acquired was: 02/06/2023. The wound has been in treatment 10 weeks. The wound is located on the Left,Lateral Foot. The wound measures 0cm length x 0cm width x 0cm depth; 0cm^2 area and 0cm^3 volume. There is no tunneling or undermining noted. There is a medium amount of serosanguineous drainage noted. The wound margin is distinct with the outline attached to the wound base. There is no granulation within the wound bed. There is no necrotic tissue within the wound bed. The periwound skin appearance did not exhibit: Callus, Crepitus, Excoriation, Induration, Rash, Scarring, Dry/Scaly, Maceration, Atrophie Blanche, Cyanosis, Ecchymosis, Hemosiderin Staining, Mottled, Pallor, Rubor, Erythema. Periwound temperature was noted as No Abnormality. Wound #26 status is Healed - Epithelialized. Original cause of wound was Trauma. The date acquired was: 06/03/2023. The wound has been in treatment 3 weeks. The wound is located on the Left,Lateral Lower Leg. The wound measures 0cm length x 0cm width x 0cm depth; 0cm^2 area and 0cm^3 volume. There is no tunneling or undermining noted. There is a medium amount of serosanguineous drainage noted. The wound margin is distinct with the outline attached to the wound base. There is no granulation within the wound bed. There is no necrotic tissue within the wound bed. The periwound skin appearance did not exhibit: Callus, Crepitus, Excoriation, Induration, Rash, Scarring, Dry/Scaly, Maceration, Atrophie Blanche, Cyanosis, Ecchymosis, Hemosiderin  Staining, Mottled, Pallor, Rubor, Erythema. Periwound temperature was noted as No Abnormality. Wound #27 status is Healed - Epithelialized. Original cause of wound was Trauma. The date acquired was: 06/03/2023. The wound has been in treatment 3 weeks. The wound is located on the Right,Anterior Lower Leg. The wound measures 0cm length x 0cm width x 0cm depth; 0cm^2 area and 0cm^3 volume. There is no tunneling or undermining noted. There is a medium amount of serosanguineous drainage noted. The wound margin is distinct with the outline attached to the wound base. There is no granulation within the wound bed. There is no necrotic tissue within the wound bed. The periwound skin appearance exhibited: Hemosiderin Staining. The periwound skin appearance did not exhibit: Callus, Crepitus, Excoriation, Induration, Rash, Scarring, Dry/Scaly, Maceration, Atrophie Blanche, Cyanosis, Ecchymosis, Mottled, Pallor, Rubor, Erythema. Periwound temperature was noted as No Abnormality. Wound #28  status is Open. Original cause of wound was Footwear Injury. The date acquired was: 06/26/2023. The wound is located on the Left T Fourth. The oe wound measures 0.1cm length x 0.1cm width x 0.1cm depth; 0.008cm^2 area and 0.001cm^3 volume. There is Fat Layer (Subcutaneous Tissue) exposed. There is no tunneling or undermining noted. There is a medium amount of serosanguineous drainage noted. There is small (1-33%) red granulation within the wound bed. There is a large (67-100%) amount of necrotic tissue within the wound bed including Eschar. The periwound skin appearance did not exhibit: Callus, Crepitus, Excoriation, Induration, Rash, Scarring, Dry/Scaly, Maceration, Atrophie Blanche, Cyanosis, Ecchymosis, Hemosiderin Staining, Mottled, Pallor, Rubor, Erythema. Assessment Active Problems ICD-10 Type 2 diabetes mellitus with foot ulcer Non-pressure chronic ulcer of other part of left foot with fat layer exposed Non-pressure  chronic ulcer of other part of right foot with fat layer exposed Type 2 diabetes mellitus with other skin ulcer Non-pressure chronic ulcer of other part of right lower leg with fat layer exposed Other specified peripheral vascular diseases Chronic venous hypertension (idiopathic) with ulcer and inflammation of right lower extremity Procedures Wound #23 Pre-procedure diagnosis of Wound #23 is a Diabetic Wound/Ulcer of the Lower Extremity located on the Right Metatarsal head first .Severity of Tissue Pre Debridement is: Fat layer exposed. There was a Excisional Skin/Subcutaneous Tissue Debridement with a total area of 0.49 sq cm performed by Lenda Kelp, PA. With the following instrument(s): Curette to remove Viable tissue/material. Material removed includes Callus, Subcutaneous Tissue, and Slough after achieving pain control using Lidocaine 4% Topical Solution. No specimens were taken. A time out was conducted at 14:35, prior to the start of the procedure. ARIANNA, REYNEN ([id number]) [id number] Page 11 of 12 Minimum amount of bleeding was controlled with Pressure. The procedure was tolerated well with a pain level of 0 throughout and a pain level of 0 following the procedure. Post Debridement Measurements: 0.9cm length x 0.7cm width x 0.3cm depth; 0.148cm^3 volume. Character of Wound/Ulcer Post Debridement is improved. Severity of Tissue Post Debridement is: Fat layer exposed. Post procedure Diagnosis Wound #23: Same as Pre-Procedure General Notes: Scribed for Allen Derry PA by J.Scotton. Plan Follow-up Appointments: Return Appointment in 1 week. Allen Derry PA Wednesday Room 7 or 9 07/05/23 at 1:30pm (already scheduled) 07/12/23 at 2:45pm Rm 9 Other: - Remember to go to Vascular and Vein appointment 07/11/23 Anesthetic: (In clinic) Topical Lidocaine 5% applied to wound bed Bathing/ Shower/ Hygiene: May shower with protection but Karla not get wound dressing(s) wet.  Protect dressing(s) with water repellant cover (for example, large plastic bag) or a cast cover and may then take shower. Edema Control - Lymphedema / SCD / Other: Elevate legs to the level of the heart or above for 30 minutes daily and/or when sitting for 3-4 times a day throughout the day. Avoid standing for long periods of time. Patient to wear own compression stockings every day. Exercise regularly - As tolerated Moisturize legs daily. Other Edema Control Orders/Instructions: - Recommend Compression stocking 15-57mm/Hg. Brochure for [employer] with printed leg measurements. WOUND #23: - Metatarsal head first Wound Laterality: Right Cleanser: Soap and Water 1 x Per Week/30 Days Discharge Instructions: May shower and wash wound with dial antibacterial soap and water prior to dressing change. Cleanser: Wound Cleanser 1 x Per Week/30 Days Discharge Instructions: Cleanse the wound with wound cleanser prior to applying a clean dressing using gauze sponges, not tissue or cotton balls. Peri-Wound Care: Sween Lotion (Moisturizing lotion)  1 x Per Week/30 Days Discharge Instructions: Apply moisturizing lotion as directed Prim Dressing: Maxorb Extra Ag+ Alginate Dressing, 4x4.75 (in/in) 1 x Per Week/30 Days ary Discharge Instructions: Apply to wound bed as instructed Secondary Dressing: Optifoam Non-Adhesive Dressing, 4x4 in 1 x Per Week/30 Days Discharge Instructions: Apply foam donut and gauze Com pression Wrap: Kerlix Roll 4.5x3.1 (in/yd) 1 x Per Week/30 Days Discharge Instructions: Apply Kerlix and Coban compression as directed. Com pression Wrap: Coban Self-Adherent Wrap 4x5 (in/yd) 1 x Per Week/30 Days Discharge Instructions: Apply over Kerlix as directed. WOUND #28: - T Fourth Wound Laterality: Left oe Cleanser: Soap and Water 3 x Per Week/30 Days Discharge Instructions: May shower and wash wound with dial antibacterial soap and water prior to dressing change. Cleanser: Wound  Cleanser 3 x Per Week/30 Days Discharge Instructions: Cleanse the wound with wound cleanser prior to applying a clean dressing using gauze sponges, not tissue or cotton balls. Prim Dressing: Maxorb Extra Ag+ Alginate Dressing, 2x2 (in/in) 3 x Per Week/30 Days ary Discharge Instructions: Apply to wound bed as instructed Prim Dressing: Bandaid 3 x Per Week/30 Days ary Discharge Instructions: or Foam Border Secondary Dressing: Bordered Gauze, 2x2 in 3 x Per Week/30 Days Discharge Instructions: Apply over primary dressing as directed. 1. I would recommend that we have the patient continue currently with the wound care measures as before and she is in agreement with plan. This includes the use of the silver alginate dressing which I think is doing well. 2. We can continue with a bordered gauze dressing to cover. 3. I am also going to recommend that the patient should continue with appropriate offloading I think this is important for the right foot. 4. I am also going to suggest that she make sure to keep the appointment with vascular which is coming up on the 27th and that is a Tuesday upcoming in a couple of weeks. Will still plan to see her on the 28th to reevaluate to see where things stand and we will go from there. Right now I Karla still question the blood flow into the distal portion of her right foot I think this may be why this wound is healing so slowly but we will continue to monitor and see how things go. We will see patient back for reevaluation in 1 week here in the clinic. If anything worsens or changes patient will contact our office for additional recommendations. Electronic Signature(s) Signed: 06/28/2023 3:10:43 PM By: Allen Derry PA-C Entered By: Allen Derry on 06/28/2023 15:10:42 -------------------------------------------------------------------------------- SuperBill Details Patient Name: Date of Service: Karla LEY, Karla [name] 06/28/2023 Medical Record Number: [id number] Patient  Account Number: [id number] Date of Birth/Sex: Treating RN: May 09, 1961 (61 y.o. SHANNARA, NIGHT ([id number]) [id number] Page 12 of 12 Primary Care Provider: [name] Other Clinician: Referring Provider: Treating Provider/Extender: [name], [name] Weeks in Treatment: 10 Diagnosis Coding ICD-10 Codes Code Description 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.522 Non-pressure chronic ulcer of other part of left foot with fat layer exposed L97.512 Non-pressure chronic ulcer of other part of right foot with fat layer exposed E11.622 Type 2 diabetes mellitus with other skin ulcer L97.812 Non-pressure chronic ulcer of other part of right lower leg with fat layer exposed I73.89 Other specified peripheral vascular diseases I87.331 Chronic venous hypertension (idiopathic) with ulcer and inflammation of right lower extremity Facility Procedures : CPT4 Code: 86578469 Description: 11042 - DEB SUBQ TISSUE 20 SQ CM/< ICD-10 Diagnosis Description L97.512 Non-pressure chronic  ulcer of other part of right foot with fat layer exposed Modifier: Quantity: 1 Physician Procedures : CPT4 Code Description Modifier [id number] 11042 - WC PHYS SUBQ TISS 20 SQ CM ICD-10 Diagnosis Description L97.512 Non-pressure chronic ulcer of other part of right foot with fat layer exposed Quantity: 1 Electronic Signature(s) Signed: 06/28/2023 3:11:24 PM By: Allen Derry PA-C Entered By: Allen Derry on 06/28/2023 15:11:24

## 2023-06-30 NOTE — Progress Notes (Signed)
ELISKA, SEEHAFER ([id number]) [id number] Page 1 of 11 Visit Report for 06/28/2023 Arrival Information Details Patient Name: Date of Service: [name] 06/28/2023 1:30 PM Medical Record Number: [id number] Patient Account Number: [id number] Date of Birth/Sex: Treating RN: [birth year] (62 y.o. F) Primary Care Aljean Horiuchi: [name] Other Clinician: Referring Mihira Tozzi: Treating Azar South/Extender: [name], [name] Weeks in Treatment: 10 Visit Information History Since Last Visit Added or deleted any medications: No Patient Arrived: Walker Any new allergies or adverse reactions: No Arrival Time: 13:57 Had a fall or experienced change in No Accompanied By: self activities of daily living that may affect Transfer Assistance: None risk of falls: Patient Requires Transmission-Based Precautions: No Signs or symptoms of abuse/neglect since last visito No Patient Has Alerts: Yes Hospitalized since last visit: No Patient Alerts: ABI R 0.57 (01/11/23) Implantable device outside of the clinic excluding No ABI L 1.04 (01/11/23) cellular tissue based products placed in the center since last visit: Has Dressing in Place as Prescribed: No Pain Present Now: Yes Electronic Signature(s) Signed: 06/30/2023 12:35:38 PM By: Thayer Dallas Entered By: Thayer Dallas on 06/28/2023 13:58:03 -------------------------------------------------------------------------------- Encounter Discharge Information Details Patient Name: Date of Service: Karla Bigness, [name] 06/28/2023 1:30 PM Medical Record Number: [id number] Patient Account Number: [id number] Date of Birth/Sex: Treating RN: October 22, 1961 (61 y.o. [name] Primary Care Roddrick Sharron: [name] Other Clinician: Referring Shemika Robbs: Treating Stephano Arrants/Extender: Juleen China Weeks in Treatment: 10 Encounter Discharge Information Items Post Procedure Vitals Discharge Condition:  Stable Temperature (F): 98.6 Ambulatory Status: Walker Pulse (bpm): 80 Discharge Destination: Home Respiratory Rate (breaths/min): 18 Transportation: Private Auto Blood Pressure (mmHg): 127/77 Accompanied By: self Schedule Follow-up Appointment: Yes Clinical Summary of Care: Patient Declined Electronic Signature(s) Signed: 06/28/2023 5:24:05 PM By: Karie Schwalbe RN Entered By: Karie Schwalbe on 06/28/2023 17:23:07 [name] ([id number]) [id number] Page 2 of 11 -------------------------------------------------------------------------------- Lower Extremity Assessment Details Patient Name: Date of Service: [name]. 06/28/2023 1:30 PM Medical Record Number: [id number] Patient Account Number: [id number] Date of Birth/Sex: Treating RN: September 06, 1961 (62 y.o. F) Primary Care Nicklas Mcsweeney: [name] Other Clinician: Referring Weston Fulco: Treating Eryka Dolinger/Extender: [name], [name] Weeks in Treatment: 10 Edema Assessment Assessed: [Left: No] [Right: No] Edema: [Left: Yes] [Right: Yes] Calf Left: Right: Point of Measurement: 30 cm From Medial Instep 44 cm 43 cm Ankle Left: Right: Point of Measurement: 9 cm From Medial Instep 23 cm 22.7 cm Vascular Assessment Extremity colors, hair growth, and conditions: Extremity Color: [Left:Hyperpigmented] [Right:Hyperpigmented] Hair Growth on Extremity: [Left:No] [Right:No] Temperature of Extremity: [Left:Warm] [Right:Warm] Capillary Refill: [Left:< 3 seconds] [Right:< 3 seconds] Dependent Rubor: [Left:No Yes] [Right:No Yes] Electronic Signature(s) Signed: 06/30/2023 12:35:38 PM By: Thayer Dallas Entered By: Thayer Dallas on 06/28/2023 14:02:55 -------------------------------------------------------------------------------- Multi-Disciplinary Care Plan Details Patient Name: Date of Service: Karla Bigness, [name] 06/28/2023 1:30 PM Medical Record Number: [id number] Patient Account Number:  [id number] Date of Birth/Sex: Treating RN: Nov 20, 1960 (61 y.o. [name] Primary Care Kitana Gage: [name] Other Clinician: Referring Charley Miske: Treating Alexandros Ewan/Extender: [name], [name] Weeks in Treatment: 10 Active Inactive Wound/Skin Impairment Nursing Diagnoses: Impaired tissue integrity Goals: Patient/caregiver will verbalize understanding of skin care regimen Date Initiated: 04/19/2023 Target Resolution Date: 08/19/2023 Goal Status: Active Interventions: ENISHA, PALLONE ([id number]) [phone].pdf Page 3 of 11 Assess ulceration(s) every visit Treatment Activities: Skin care regimen initiated : 04/19/2023 Notes: Electronic Signature(s) Signed: 06/28/2023 5:24:05 PM By: Karie Schwalbe RN Entered By: Karie Schwalbe on 06/28/2023 15:10:49 --------------------------------------------------------------------------------  Pain Assessment Details Patient Name: Date of Service: [name] 06/28/2023 1:30 PM Medical Record Number: [id number] Patient Account Number: [id number] Date of Birth/Sex: Treating RN: [birth year] (62 y.o. F) Primary Care Brittie Whisnant: [name] Other Clinician: Referring Sherisa Gilvin: Treating Fue Cervenka/Extender: [name], [name] Weeks in Treatment: 10 Active Problems Location of Pain Severity and Description of Pain Patient Has Paino Yes Site Locations Pain Location: Generalized Pain Rate the pain. Current Pain Level: 10 Pain Management and Medication Current Pain Management: Electronic Signature(s) Signed: 06/30/2023 12:35:38 PM By: Thayer Dallas Entered By: Thayer Dallas on 06/28/2023 13:58:17 -------------------------------------------------------------------------------- Patient/Caregiver Education Details Patient Name: Date of Service: [name] 8/14/2024andnbsp1:30 PM Medical Record Number: [id number] Patient Account Number: [id number] Date of Birth/Gender: Treating  RN: 23-Dec-1960 (62 y.o. [name] Primary Care Physician: [name] Other Clinician: Referring Physician: Treating Physician/Extender: [name], [name] Weeks in Treatment: [address] ([id number]) [id number] Page 4 of 11 Education Assessment Education Provided To: Patient Education Topics Provided Wound/Skin Impairment: Methods: Explain/Verbal Responses: See progress note Electronic Signature(s) Signed: 06/28/2023 5:24:05 PM By: Karie Schwalbe RN Entered By: Karie Schwalbe on 06/28/2023 17:22:05 -------------------------------------------------------------------------------- Wound Assessment Details Patient Name: Date of Service: Karla Bigness, [name] 06/28/2023 1:30 PM Medical Record Number: [id number] Patient Account Number: [id number] Date of Birth/Sex: Treating RN: 08/07/1961 (61 y.o. F) Primary Care Deandria Klute: [name] Other Clinician: Referring Taylr Meuth: Treating Gavino Fouch/Extender: [name], [name] Weeks in Treatment: 10 Wound Status Wound Number: 23 Primary Diabetic Wound/Ulcer of the Lower Extremity Etiology: Wound Location: Right Metatarsal head first Wound Open Wounding Event: Gradually Appeared Status: Date Acquired: 04/18/2021 Comorbid Chronic Obstructive Pulmonary Disease (COPD), Sleep Apnea, Weeks Of Treatment: 10 History: Arrhythmia, Congestive Heart Failure, Coronary Artery Disease, Clustered Wound: No Hypertension, Peripheral Arterial Disease, Cirrhosis , Type II Diabetes, Osteoarthritis, Neuropathy Photos Wound Measurements Length: (cm) 0.9 Width: (cm) 0.7 Depth: (cm) 0.3 Area: (cm) 0.495 Volume: (cm) 0.148 % Reduction in Area: 47.5% % Reduction in Volume: 68.6% Epithelialization: Small (1-33%) Tunneling: No Undermining: No Wound Description Classification: Grade 2 Wound Margin: Distinct, outline attached Exudate Amount: Medium Exudate Type: Serosanguineous Exudate  Color: red, brown Foul Odor After Cleansing: No Slough/Fibrino Yes Wound Bed [name] ([id number]) [id number] Page 5 of 11 Granulation Amount: None Present (0%) Exposed Structure Necrotic Amount: Large (67-100%) Fascia Exposed: No Necrotic Quality: Adherent Slough Fat Layer (Subcutaneous Tissue) Exposed: Yes Tendon Exposed: No Muscle Exposed: No Joint Exposed: No Bone Exposed: No Periwound Skin Texture Texture Color No Abnormalities Noted: No No Abnormalities Noted: Yes Callus: Yes Temperature / Pain Temperature: No Abnormality Moisture No Abnormalities Noted: Yes Treatment Notes Wound #23 (Metatarsal head first) Wound Laterality: Right Cleanser Soap and Water Discharge Instruction: May shower and wash wound with dial antibacterial soap and water prior to dressing change. Wound Cleanser Discharge Instruction: Cleanse the wound with wound cleanser prior to applying a clean dressing using gauze sponges, not tissue or cotton balls. Peri-Wound Care Sween Lotion (Moisturizing lotion) Discharge Instruction: Apply moisturizing lotion as directed Topical Primary Dressing Maxorb Extra Ag+ Alginate Dressing, 4x4.75 (in/in) Discharge Instruction: Apply to wound bed as instructed Secondary Dressing Optifoam Non-Adhesive Dressing, 4x4 in Discharge Instruction: Apply foam donut and gauze Secured With Compression Wrap Kerlix Roll 4.5x3.1 (in/yd) Discharge Instruction: Apply Kerlix and Coban compression as directed. Coban Self-Adherent Wrap 4x5 (in/yd) Discharge Instruction: Apply over Kerlix as directed. Compression Stockings Add-Ons Electronic Signature(s) Signed: 06/28/2023 5:24:05 PM By: Karie Schwalbe RN Entered By: Baruch Merl,  Randa Evens on 06/28/2023 14:43:35 -------------------------------------------------------------------------------- Wound Assessment Details Patient Name: Date of Service: [name] 06/28/2023 1:30 PM Medical Record  Number: [id number] Patient Account Number: [id number] Date of Birth/Sex: Treating RN: [birth year] (62 y.o. F) Primary Care Almalik Weissberg: [name] Other Clinician: Referring Rhodes Calvert: Treating Kanden Carey/Extender: [name], [name] Weeks in Treatment: [address] MIRENA, BELAN ([id number]) [id number] Page 6 of 11 Wound Number: 24 Primary Diabetic Wound/Ulcer of the Lower Extremity Etiology: Wound Location: Left, Lateral Foot Wound Healed - Epithelialized Wounding Event: Gradually Appeared Status: Date Acquired: 02/06/2023 Comorbid Chronic Obstructive Pulmonary Disease (COPD), Sleep Apnea, Weeks Of Treatment: 10 History: Arrhythmia, Congestive Heart Failure, Coronary Artery Disease, Clustered Wound: No Hypertension, Peripheral Arterial Disease, Cirrhosis , Type II Diabetes, Osteoarthritis, Neuropathy Photos Wound Measurements Length: (cm) Width: (cm) Depth: (cm) Area: (cm) Volume: (cm) 0 % Reduction in Area: 100% 0 % Reduction in Volume: 100% 0 Epithelialization: Large (67-100%) 0 Tunneling: No 0 Undermining: No Wound Description Classification: Grade 1 Wound Margin: Distinct, outline attached Exudate Amount: Medium Exudate Type: Serosanguineous Exudate Color: red, brown Foul Odor After Cleansing: No Slough/Fibrino No Wound Bed Granulation Amount: None Present (0%) Exposed Structure Necrotic Amount: None Present (0%) Fascia Exposed: No Fat Layer (Subcutaneous Tissue) Exposed: No Tendon Exposed: No Muscle Exposed: No Joint Exposed: No Bone Exposed: No Periwound Skin Texture Texture Color No Abnormalities Noted: No No Abnormalities Noted: No Callus: No Atrophie Blanche: No Crepitus: No Cyanosis: No Excoriation: No Ecchymosis: No Induration: No Erythema: No Rash: No Hemosiderin Staining: No Scarring: No Mottled: No Pallor: No Moisture Rubor: No No Abnormalities Noted: No Dry / Scaly: No Temperature /  Pain Maceration: No Temperature: No Abnormality Electronic Signature(s) Signed: 06/28/2023 5:24:05 PM By: Karie Schwalbe RN Entered By: Karie Schwalbe on 06/28/2023 14:44:39 Wound Assessment Details -------------------------------------------------------------------------------- [name] ([id number]) [id number] Page 7 of 11 Patient Name: Date of Service: [name] 06/28/2023 1:30 PM Medical Record Number: [id number] Patient Account Number: [id number] Date of Birth/Sex: Treating RN: 1961-05-10 (62 y.o. F) Primary Care Wash Nienhaus: [name] Other Clinician: Referring Jarrid Lienhard: Treating Erendira Crabtree/Extender: [name], [name] Weeks in Treatment: 10 Wound Status Wound Number: 26 Primary Diabetic Wound/Ulcer of the Lower Extremity Etiology: Wound Location: Left, Lateral Lower Leg Secondary Venous Leg Ulcer Wounding Event: Trauma Etiology: Date Acquired: 06/03/2023 Wound Healed - Epithelialized Weeks Of Treatment: 3 Status: Clustered Wound: No Comorbid Chronic Obstructive Pulmonary Disease (COPD), Sleep Apnea, History: Arrhythmia, Congestive Heart Failure, Coronary Artery Disease, Hypertension, Peripheral Arterial Disease, Cirrhosis , Type II Diabetes, Osteoarthritis, Neuropathy Photos Wound Measurements Length: (cm) 0 % Reduction in Area: 100% Width: (cm) 0 % Reduction in Volume: 100% Depth: (cm) 0 Epithelialization: Large (67-100%) Area: (cm) 0 Tunneling: No Volume: (cm) 0 Undermining: No Wound Description Classification: Grade 1 Foul Odor After Cleansing: No Wound Margin: Distinct, outline attached Slough/Fibrino No Exudate Amount: Medium Exudate Type: Serosanguineous Exudate Color: red, brown Wound Bed Granulation Amount: None Present (0%) Exposed Structure Necrotic Amount: None Present (0%) Fascia Exposed: No Fat Layer (Subcutaneous Tissue) Exposed: No Tendon Exposed: No Muscle Exposed: No Joint Exposed:  No Bone Exposed: No Periwound Skin Texture Texture Color No Abnormalities Noted: No No Abnormalities Noted: No Callus: No Atrophie Blanche: No Crepitus: No Cyanosis: No Excoriation: No Ecchymosis: No Induration: No Erythema: No Rash: No Hemosiderin Staining: No Scarring: No Mottled: No Pallor: No Moisture Rubor: No No Abnormalities Noted: No Dry / Scaly: No Temperature / Pain Maceration: No Temperature: No Abnormality Electronic Signature(s) Signed:  06/28/2023 5:24:05 PM By: Karie Schwalbe RN Entered By: Karie Schwalbe on 06/28/2023 14:45:27 [name] ([id number]) [id number] Page 8 of 11 -------------------------------------------------------------------------------- Wound Assessment Details Patient Name: Date of Service: [name]. 06/28/2023 1:30 PM Medical Record Number: [id number] Patient Account Number: [id number] Date of Birth/Sex: Treating RN: 28-Sep-1961 (62 y.o. F) Primary Care Leasha Goldberger: [name] Other Clinician: Referring Jessica Seidman: Treating Tychelle Purkey/Extender: [name], [name] Weeks in Treatment: 10 Wound Status Wound Number: 27 Primary Diabetic Wound/Ulcer of the Lower Extremity Etiology: Wound Location: Right, Anterior Lower Leg Secondary Venous Leg Ulcer Wounding Event: Trauma Etiology: Date Acquired: 06/03/2023 Wound Healed - Epithelialized Weeks Of Treatment: 3 Status: Clustered Wound: Yes Comorbid Chronic Obstructive Pulmonary Disease (COPD), Sleep Apnea, History: Arrhythmia, Congestive Heart Failure, Coronary Artery Disease, Hypertension, Peripheral Arterial Disease, Cirrhosis , Type II Diabetes, Osteoarthritis, Neuropathy Photos Wound Measurements Length: (cm) Width: (cm) Depth: (cm) Clustered Quantity: Area: (cm) Volume: (cm) 0 % Reduction in Area: 100% 0 % Reduction in Volume: 100% 0 Epithelialization: Large (67-100%) 3 Tunneling: No 0 Undermining: No 0 Wound  Description Classification: Grade 1 Wound Margin: Distinct, outline attached Exudate Amount: Medium Exudate Type: Serosanguineous Exudate Color: red, brown Foul Odor After Cleansing: No Slough/Fibrino No Wound Bed Granulation Amount: None Present (0%) Exposed Structure Necrotic Amount: None Present (0%) Fascia Exposed: No Fat Layer (Subcutaneous Tissue) Exposed: No Tendon Exposed: No Muscle Exposed: No Joint Exposed: No Bone Exposed: No Periwound Skin Texture Texture Color No Abnormalities Noted: No No Abnormalities Noted: No Callus: No Atrophie Blanche: No Crepitus: No Cyanosis: No Excoriation: No Ecchymosis: No Induration: No Erythema: No Rash: No Hemosiderin Staining: Yes RODINA, SANMARTIN ([id number]) [id number] Page 9 of 11 Scarring: No Mottled: No Pallor: No Moisture Rubor: No No Abnormalities Noted: No Dry / Scaly: No Temperature / Pain Maceration: No Temperature: No Abnormality Electronic Signature(s) Signed: 06/28/2023 5:24:05 PM By: Karie Schwalbe RN Entered By: Karie Schwalbe on 06/28/2023 14:46:35 -------------------------------------------------------------------------------- Wound Assessment Details Patient Name: Date of Service: Karla Bigness, [name] 06/28/2023 1:30 PM Medical Record Number: [id number] Patient Account Number: [id number] Date of Birth/Sex: Treating RN: 1961-06-21 (61 y.o. F) Primary Care Blessing Zaucha: [name] Other Clinician: Referring [name]: Treating Brianna Esson/Extender: [name], [name] Weeks in Treatment: 10 Wound Status Wound Number: 28 Primary Diabetic Wound/Ulcer of the Lower Extremity Etiology: Wound Location: Left T Fourth oe Wound Open Wounding Event: Footwear Injury Status: Date Acquired: 06/26/2023 Comorbid Chronic Obstructive Pulmonary Disease (COPD), Sleep Apnea, Weeks Of Treatment: 0 History: Arrhythmia, Congestive Heart Failure, Coronary Artery Disease, Clustered  Wound: No Hypertension, Peripheral Arterial Disease, Cirrhosis , Type II Diabetes, Osteoarthritis, Neuropathy Photos Wound Measurements Length: (cm) 0.1 Width: (cm) 0.1 Depth: (cm) 0.1 Area: (cm) 0.008 Volume: (cm) 0.001 % Reduction in Area: % Reduction in Volume: Epithelialization: Large (67-100%) Tunneling: No Undermining: No Wound Description Classification: Grade 1 Exudate Amount: Medium Exudate Type: Serosanguineous Exudate Color: red, brown Foul Odor After Cleansing: No Slough/Fibrino Yes Wound Bed Granulation Amount: Small (1-33%) Exposed Structure Granulation Quality: Red Fascia Exposed: No Necrotic Amount: Large (67-100%) Fat Layer (Subcutaneous Tissue) Exposed: Yes Necrotic Quality: Eschar Tendon Exposed: No Muscle Exposed: No Joint Exposed: No Bone Exposed: No [address] LOLANDA, PETROVSKI ([id number]) [id number] Page 10 of 11 Texture Color No Abnormalities Noted: No No Abnormalities Noted: No Callus: No Atrophie Blanche: No Crepitus: No Cyanosis: No Excoriation: No Ecchymosis: No Induration: No Erythema: No Rash: No Hemosiderin Staining: No Scarring: No Mottled: No Pallor: No Moisture Rubor: No No Abnormalities  Noted: No Dry / Scaly: No Maceration: No Treatment Notes Wound #28 (Toe Fourth) Wound Laterality: Left Cleanser Soap and Water Discharge Instruction: May shower and wash wound with dial antibacterial soap and water prior to dressing change. Wound Cleanser Discharge Instruction: Cleanse the wound with wound cleanser prior to applying a clean dressing using gauze sponges, not tissue or cotton balls. Peri-Wound Care Topical Primary Dressing Maxorb Extra Ag+ Alginate Dressing, 2x2 (in/in) Discharge Instruction: Apply to wound bed as instructed Bandaid Discharge Instruction: or Foam Border Secondary Dressing Bordered Gauze, 2x2 in Discharge Instruction: Apply over primary dressing as  directed. Secured With Compression Wrap Compression Stockings Facilities manager) Signed: 06/28/2023 5:24:05 PM By: Karie Schwalbe RN Entered By: Karie Schwalbe on 06/28/2023 14:47:35 -------------------------------------------------------------------------------- Vitals Details Patient Name: Date of Service: Karla Bigness, [name] 06/28/2023 1:30 PM Medical Record Number: [id number] Patient Account Number: [id number] Date of Birth/Sex: Treating RN: 06/15/1961 (61 y.o. F) Primary Care Maeci Kalbfleisch: [name] Other Clinician: Referring Donnajean Chesnut: Treating Sirus Labrie/Extender: [name], [name] Weeks in Treatment: 10 Vital Signs Time Taken: 08:14 Temperature (F): 98.6 Height (in): 64 Pulse (bpm): 80 Weight (lbs): 225 Respiratory Rate (breaths/min): 18 Body Mass Index (BMI): 38.6 Blood Pressure (mmHg): 127/77 Reference Range: 80 - 120 mg / dl AMIRAH, OUTWATER ([id number]) [id number] Page 11 of 11 Electronic Signature(s) Signed: 06/30/2023 12:35:38 PM By: Thayer Dallas Entered By: Thayer Dallas on 06/28/2023 14:02:32

## 2023-07-05 ENCOUNTER — Ambulatory Visit (HOSPITAL_COMMUNITY)
Admission: RE | Admit: 2023-07-05 | Discharge: 2023-07-05 | Disposition: A | Discharge status: Home / Self Care | Payer: Medicare HMO | Source: Ambulatory Visit | Attending: Adult Health | Admitting: Adult Health

## 2023-07-05 ENCOUNTER — Telehealth (HOSPITAL_COMMUNITY): Payer: Self-pay

## 2023-07-05 ENCOUNTER — Encounter (HOSPITAL_COMMUNITY): Payer: Self-pay

## 2023-07-05 ENCOUNTER — Encounter (HOSPITAL_BASED_OUTPATIENT_CLINIC_OR_DEPARTMENT_OTHER): Payer: Medicare HMO | Admitting: Physician Assistant

## 2023-07-05 VITALS — BP 156/64 | HR 88 | Wt 219.0 lb

## 2023-07-05 DIAGNOSIS — N1831 Chronic kidney disease, stage 3a: Secondary | ICD-10-CM

## 2023-07-05 DIAGNOSIS — M25569 Pain in unspecified knee: Secondary | ICD-10-CM | POA: Diagnosis not present

## 2023-07-05 DIAGNOSIS — M79606 Pain in leg, unspecified: Secondary | ICD-10-CM | POA: Insufficient documentation

## 2023-07-05 DIAGNOSIS — Z716 Tobacco abuse counseling: Secondary | ICD-10-CM | POA: Insufficient documentation

## 2023-07-05 DIAGNOSIS — Z7984 Long term (current) use of oral hypoglycemic drugs: Secondary | ICD-10-CM | POA: Insufficient documentation

## 2023-07-05 DIAGNOSIS — K7460 Unspecified cirrhosis of liver: Secondary | ICD-10-CM | POA: Insufficient documentation

## 2023-07-05 DIAGNOSIS — I5032 Chronic diastolic (congestive) heart failure: Secondary | ICD-10-CM | POA: Insufficient documentation

## 2023-07-05 DIAGNOSIS — E785 Hyperlipidemia, unspecified: Secondary | ICD-10-CM | POA: Insufficient documentation

## 2023-07-05 DIAGNOSIS — E1151 Type 2 diabetes mellitus with diabetic peripheral angiopathy without gangrene: Secondary | ICD-10-CM | POA: Insufficient documentation

## 2023-07-05 DIAGNOSIS — Z7901 Long term (current) use of anticoagulants: Secondary | ICD-10-CM | POA: Diagnosis not present

## 2023-07-05 DIAGNOSIS — E11621 Type 2 diabetes mellitus with foot ulcer: Secondary | ICD-10-CM | POA: Diagnosis not present

## 2023-07-05 DIAGNOSIS — J449 Chronic obstructive pulmonary disease, unspecified: Secondary | ICD-10-CM | POA: Insufficient documentation

## 2023-07-05 DIAGNOSIS — N183 Chronic kidney disease, stage 3 unspecified: Secondary | ICD-10-CM | POA: Insufficient documentation

## 2023-07-05 DIAGNOSIS — Z720 Tobacco use: Secondary | ICD-10-CM

## 2023-07-05 DIAGNOSIS — I6521 Occlusion and stenosis of right carotid artery: Secondary | ICD-10-CM | POA: Insufficient documentation

## 2023-07-05 DIAGNOSIS — I480 Paroxysmal atrial fibrillation: Secondary | ICD-10-CM | POA: Insufficient documentation

## 2023-07-05 DIAGNOSIS — E1122 Type 2 diabetes mellitus with diabetic chronic kidney disease: Secondary | ICD-10-CM | POA: Insufficient documentation

## 2023-07-05 DIAGNOSIS — L97919 Non-pressure chronic ulcer of unspecified part of right lower leg with unspecified severity: Secondary | ICD-10-CM | POA: Diagnosis not present

## 2023-07-05 DIAGNOSIS — I130 Hypertensive heart and chronic kidney disease with heart failure and stage 1 through stage 4 chronic kidney disease, or unspecified chronic kidney disease: Secondary | ICD-10-CM | POA: Diagnosis not present

## 2023-07-05 DIAGNOSIS — Z794 Long term (current) use of insulin: Secondary | ICD-10-CM | POA: Diagnosis not present

## 2023-07-05 DIAGNOSIS — I110 Hypertensive heart disease with heart failure: Secondary | ICD-10-CM

## 2023-07-05 DIAGNOSIS — I2510 Atherosclerotic heart disease of native coronary artery without angina pectoris: Secondary | ICD-10-CM | POA: Diagnosis not present

## 2023-07-05 DIAGNOSIS — G4733 Obstructive sleep apnea (adult) (pediatric): Secondary | ICD-10-CM | POA: Diagnosis not present

## 2023-07-05 DIAGNOSIS — Z79899 Other long term (current) drug therapy: Secondary | ICD-10-CM | POA: Diagnosis not present

## 2023-07-05 DIAGNOSIS — I5042 Chronic combined systolic (congestive) and diastolic (congestive) heart failure: Secondary | ICD-10-CM

## 2023-07-05 DIAGNOSIS — L97514 Non-pressure chronic ulcer of other part of right foot with necrosis of bone: Secondary | ICD-10-CM

## 2023-07-05 LAB — BASIC METABOLIC PANEL
Anion gap: 16 — ABNORMAL HIGH (ref 5–15)
BUN: 59 mg/dL — ABNORMAL HIGH (ref 6–20)
CO2: 29 mmol/L (ref 22–32)
Calcium: 9.5 mg/dL (ref 8.9–10.3)
Chloride: 93 mmol/L — ABNORMAL LOW (ref 98–111)
Creatinine, Ser: 1.76 mg/dL — ABNORMAL HIGH (ref 0.44–1.00)
GFR, Estimated: 33 mL/min — ABNORMAL LOW (ref >60)
Glucose, Bld: 240 mg/dL — ABNORMAL HIGH (ref 70–99)
Potassium: 3.3 mmol/L — ABNORMAL LOW (ref 3.5–5.1)
Sodium: 138 mmol/L (ref 135–145)

## 2023-07-05 MED ORDER — APIXABAN 5 MG PO TABS: 5.0000 mg | ORAL_TABLET | Freq: Two times a day (BID) | ORAL | 6 refills | Status: DC

## 2023-07-05 MED ORDER — HYDRALAZINE HCL 50 MG PO TABS
50.0000 mg | ORAL_TABLET | Freq: Two times a day (BID) | ORAL | 6 refills | Status: DC
Start: 2023-07-05 — End: 2023-08-05

## 2023-07-05 MED ORDER — BUPROPION HCL ER (SR) 100 MG PO TB12: 100.0000 mg | ORAL_TABLET | Freq: Every day | ORAL | 6 refills | Status: DC

## 2023-07-05 MED ORDER — POTASSIUM CHLORIDE CRYS ER 20 MEQ PO TBCR
20.0000 meq | EXTENDED_RELEASE_TABLET | Freq: Two times a day (BID) | ORAL | 11 refills | Status: DC
Start: 2023-07-05 — End: 2023-07-21

## 2023-07-05 NOTE — Progress Notes (Signed)
KINSLEY, LEARNED ([id number]) [id number] Page 1 of 2 Visit Report for 07/05/2023 Chief Complaint Document Details Patient Name: Date of Service: [name] 07/05/2023 1:30 PM Medical Record Number: [id number] Patient Account Number: [id number] Date of Birth/Sex: Treating RN: 09/05/1961 (62 y.o. F) Primary Care Provider: [name] Other Clinician: Referring Provider: Treating Provider/Extender: [name], [name] Weeks in Treatment: 11 Information Obtained from: Patient Chief Complaint Bilateral LE Ulcers Electronic Signature([name] Signed: 07/05/2023 1:47:38 PM By: Allen Derry PA-C Entered By: Allen Derry on 07/05/2023 10:47:38 -------------------------------------------------------------------------------- Problem List Details Patient Name: Date of Service: [name], DO [name]. 07/05/2023 1:30 PM Medical Record Number: [id number] Patient Account Number: [id number] Date of Birth/Sex: Treating RN: July 19, 1961 (61 y.o. F) Primary Care Provider: [name] Other Clinician: Referring Provider: Treating Provider/Extender: [name], [name] Weeks in Treatment: 11 Active Problems ICD-10 Encounter Code Description Active Date MDM Diagnosis 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 2 diabetes mellitus with foot ulcer 04/19/2023 No Yes L97.522 Non-pressure chronic ulcer of other part of left foot with fat layer 04/19/2023 No Yes exposed L97.512 Non-pressure chronic ulcer of other part of right foot with fat layer 04/19/2023 No Yes exposed E11.622 Type 2 diabetes mellitus with other skin ulcer 04/19/2023 No Yes L97.812 Non-pressure chronic ulcer of other part of right lower leg with fat 04/19/2023 No Yes layer exposed I73.89 Other specified peripheral vascular diseases 04/19/2023 No Yes [name], [name] ([id number]) [id number] Page 2 of 2 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

## 2023-07-05 NOTE — Progress Notes (Signed)
Pt referred to [facility] for med management.  CSW sent referral to paramedics for review and follow up.  Burna Sis, LCSW Clinical Social Worker [facility] Desk#: [phone] Cell#: [phone]

## 2023-07-05 NOTE — Addendum Note (Signed)
Encounter addended by: Burna Sis, LCSW on: 07/05/2023 2:34 PM  Actions taken: Clinical Note Signed

## 2023-07-05 NOTE — Progress Notes (Addendum)
Patient ID: [name], female   DOB: [birth year], 62 y.o.   MRN: [id number]    Advanced Heart Failure Clinic Note   PCP: [facility] PV: Dr. Allyson Sabal HF Cardiology: Dr. Shirlee Latch  [name] is a 62 y.o. with history of PAD, carotid stenosis s/p left CEA, relatively mild CAD, chronic systolic CHF, paroxysmal atrial fibrillation and prior substance abuse presents for CHF clinic followup.  She was admitted in 1/17 with acute hypoxemic respiratory failure in the setting of atrial fibrillation/RVR and volume overload.  She was initially intubated.  She converted back to NSR with amiodarone gtt.  She was treated with IV Lasix, steroids, bronchodilators.  She developed AKI and losartan was stopped.    She was admitted in 3/17 with symptomatic bradycardia.  She was supposed to stop diltiazem after this admission but continued to take it.  She presented later in 3/17, again with symptomatic bradycardia (junctional bradycardia), hypotension, and AKI.  Diltiazem and bisoprolol were stopped.  HR recovered and she has had no problems since.  ACEI was stopped with AKI.   In 5/18, she had peripheral angiography with atherectomy of right SFA.  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 7/18 ABIs showed 0.65 on right, 0.31 on left.  In 8/20, she had peripheral angiography showing totally occluded left SFA.  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 Arbie Cookey.  ABIs in 2/21 were 0.44 on right, 0.99 on left.   Echo in 1/21 showed EF 50%, mild LVH, normal RV, PASP 60 mmHg.   Echo 03/08/21 EF 55-60% with basal to mid inferolateral hypokinesis.   Admitted 6/22 for mechanical fall, found to have AKI on CKD. She was given IV lasix and her losartan was held.  Underwent PV angiogram with Dr. Allyson Sabal 7/22. She had drug-coated balloon angioplasty of the right SFA after being admitted for critical ischemia of the right foot.  Repeat peripheral  arterial dopplers in 8/22 still showed severe right SFA disease.   Admitted 8/22 for bradycardia and AKI, bisoprolol subsequently stopped; amiodarone was continued.   Follow up 1/24, she was volume overloaded and instructed to take metolazone 2.5 mg x 1 and increase torsemide to 100 bid.  Today she returns for HF follow up. Complaining of leg pain.  Frustrated because her meds are not right. Plans to change insurance next month. Limited by leg pain. Occasionally short of breath. Denies ND/Orthopnea. Appetite ok. No fever or chills. Weight at home not accurate. She is out of her spiro, hydralazin, eliquis, cymbalta, wellbutrin. Taking all medications. Smoking 1/2 PPD. Lives with her sister and nephew.   Cardiomems: PAD 17, goal 20   Labs (1/17): K 5.2, creatinine 1.4, AST 43, ALT normal Labs (2/17): K 4.2, creatinine 1.3, BNP 607, TSH normal Labs (3/17): K 4.2, creatinine 1.45, AST/ALT normal, HCT 28.7 Labs (4/17): K 4.4, creatinine 1.61 Labs (08/31/2016): K 4.5 Creatinine 1.43  Labs (11/17): K 4.2, creatinine 1.4 Labs (9/18): K 4.1, creatinine 1.1, TSH normal Labs 04/13/2018: K 5 Creatinine 1.75 Labs (2/21): K 4.1, creatinine 1.58 Labs (4/22): K 3.7, creatinine 1.55 Labs (5/22): K 3.8, creatinine 1.54 Labs (8/22): K 4.0, creatinine 2.07 Labs (11/22): TSH normal, LDL 59 Labs (12/22): K 3.6, creatinine 1.28, BNP 256, AST/ALT normal Labs (3/23): K 4.3, creatinine 1.82 => 1.7, hgb 11 Labs (5/22) K 3.6, creatinine 2.2, TG 405  Labs (6/22) K 3.6, creatinine 1.34  Labs (8/23):  K 4.7, creatinine 2.11, LDL 93, TGs 315 Labs (10/23): K 3.4, creatinine 1.35 Labs (1/24): K 4.0, creatinine 3.58  PMH: 1. Carotid stenosis: Known occluded right carotid.  Left CEA in 4/16.  - Carotid dopplers (7/21): CTO RICA, mild disease LICA.  - Carotid dopplers (7/22): CTO RICA, 1-39% LICA.  - Carotid dopplers (3/23): Totally occluded RICA, 1-39% LICA.  2. CAD: LHC in 12/15 with 80% stenosis in small OM1,  nonobstructive disease in other territories.  3. Chronic systolic CHF: Nonischemic cardiomyopathy (?due to ETOH or prior drug abuse).  She has a Cardiomems.  - Echo (1/17) with EF 45%, mild LV hypertrophy, moderate diastolic dysfunction, inferolateral severe hypokinesis, mildly decreased RV systolic function.  - Echo (1/18): EF 40-45%, mild LVH, normal RV size and systolic function.  - Echo (1/21): EF 50%, mild LVH, normal RV, PASP 60 mmHg.  - Echo (4/22): EF 55-60%, basal-mid inferolateral hypokinesis with grade II diastolic dysfunction, RV normal, PASP 42 -Echo (02/2023): EF 50-55%.  4. Atrial fibrillation: Paroxysmal.   5. Type II diabetes 6. CKD: Stage III. 7. COPD: Smokes 1 ppd.  8. 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  No longer drinks.  9. Hypothyroidism 10. PAD: Atherectomy SFA in 2014 (Dr Allyson Sabal).  Peripheral arterial dopplers (2/17) with focal 75-99% proximal right SFA stenosis, occluded mid-distal right SFA, chronic occlusion of all runoff arteries on right. - In 5/18, she had peripheral angiography with atherectomy of right SFA.   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  - 7/18 ABIs showed 0.65 on right, 0.31 on left.   - Peripheral angiography 8/20: Totally occluded left SFA.  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 Arbie Cookey.   - ABIs (2/21): 0.44 R, 0.99 L.  - Balloon angioplasty right SFA 7/22.  - ABIs (8/22): severe right SFA disease.  - peripheral arterial dopplers (2/23): 50-74% right SFA and right popliteal. Stable.  11. Anemia 12. Prior cocaine abuse.  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  14. OSA: Has not been able to tolerate CPAP.  15. Diabetic neuropathy.   SH: Lives with sister.  Prior cocaine abuse.  Prior ETOH abuse.  Quit smoking in 1/17, restarted, and quit again in 4/19, restarted again.  FH: CAD  Review of systems complete and found to be  negative unless listed in HPI.    Current Outpatient Medications  Medication Sig Dispense Refill   ACCU-CHEK GUIDE test strip USE TO CHECK BLOOD SUGAR FOUR TIMES DAILY     acetaminophen (TYLENOL) 500 MG tablet Take 1,000 mg by mouth every 6 (six) hours as needed for headache (pain).     albuterol (VENTOLIN HFA) 108 (90 Base) MCG/ACT inhaler USE 2 PUFFS BY MOUTH EVERY FOUR HOURS, AS NEEDED, FOR COUGHING/WHEEZING 8.5 g 3   apixaban (ELIQUIS) 5 MG TABS tablet TAKE 1 TABLET (5 MG TOTAL) BY MOUTH 2 (TWO) TIMES DAILY (AM+BEDTIME) 60 tablet 11   Aspirin-Salicylamide-Caffeine (BC HEADACHE POWDER PO) Take 1 packet by mouth 4 (four) times daily as needed (pain/headache).     budesonide-formoterol (SYMBICORT) 160-4.5 MCG/ACT inhaler Inhale 2 puffs into the lungs 2 (two) times daily. 1 each 2   buPROPion (WELLBUTRIN SR) 150 MG 12 hr tablet TAKE 1 TABLET (150 MG TOTAL) BY MOUTH 2 (TWO) TIMES DAILY. (AM+ BEDTIME) 180 tablet 0   Cholecalciferol (VITAMIN D3 PO) Take 1 capsule by mouth every morning.  clindamycin (CLEOCIN) 300 MG capsule Take 300 mg by mouth 3 (three) times daily.     colchicine 0.6 MG tablet TAKE 2 TABS (1.2 MG) AT THE ONSET OF A GOUT FLARE, MAY TAKE 1 TAB (0.6 MG) IN 1 HOUR IF SYMPTOMS 30 tablet 0   cyclobenzaprine (FLEXERIL) 10 MG tablet Take 1 tablet (10 mg total) by mouth 2 (two) times daily as needed for muscle spasms. 20 tablet 0   DULoxetine (CYMBALTA) 60 MG capsule TAKE 1 CAPSULE (60 MG TOTAL) BY MOUTH DAILY. FOR DIABETIC NEUROPATHY (AM) 30 capsule 0   EASY COMFORT PEN NEEDLES 31G X 5 MM MISC USE FOUR TIMES A DAY FOR INSULIN ADMINISTRATION 100 each 1   hydrALAZINE (APRESOLINE) 50 MG tablet TAKE 1 & 1/2 TABLETS (75 MG TOTAL) BY MOUTH 3 (THREE) TIMES DAILY (AM+NOON+BEDTIME) 140 tablet 4   insulin lispro (HUMALOG) 100 UNIT/ML KwikPen Inject 10 Units into the skin 3 (three) times daily with meals. 15 mL 0   JARDIANCE 10 MG TABS tablet TAKE 1 TABLET (10 MG TOTAL) BY MOUTH DAILY BEFORE  BREAKFAST (AM) 90 tablet 3   LANTUS SOLOSTAR 100 UNIT/ML Solostar Pen Inject 37 Units into the skin daily. 15 mL 0   levothyroxine (SYNTHROID) 50 MCG tablet TAKE 1 TABLET (50 MCG TOTAL) BY MOUTH DAILY BEFORE BREAKFAST (AM) 30 tablet 0   lidocaine (LIDODERM) 5 % Place 1 patch onto the skin daily. Remove & Discard patch within 12 hours or as directed by MD 30 patch 0   losartan (COZAAR) 25 MG tablet TAKE 2 TABLETS (50 MG TOTAL) BY MOUTH DAILY (AM) 60 tablet 0   Omega-3 Fatty Acids (FISH OIL PO) Take 1 capsule by mouth every morning.     ondansetron (ZOFRAN-ODT) 8 MG disintegrating tablet Take 1 tablet (8 mg total) by mouth every 8 (eight) hours as needed for nausea or vomiting. 20 tablet 0   pantoprazole (PROTONIX) 40 MG tablet TAKE 1 TABLET (40 MG TOTAL) BY MOUTH DAILY(AM) 30 tablet 0   potassium chloride SA (KLOR-CON M) 20 MEQ tablet TAKE 1 TABLET (20 MEQ TOTAL) BY MOUTH 2 (TWO) TIMES DAILY. (AM+EVENING) 60 tablet 4   rosuvastatin (CRESTOR) 40 MG tablet Take 1 tablet (40 mg total) by mouth daily. 90 tablet 3   spironolactone (ALDACTONE) 25 MG tablet TAKE 0.5 TABLET (12.5 MG TOTAL) BY MOUTH DAILY (AM) 45 tablet 3   torsemide (DEMADEX) 100 MG tablet TAKE 1 TABLET (100 MG TOTAL) BY MOUTH 2 (TWO) TIMES DAILY. (AM+BEDTIME) 60 tablet 3   triamcinolone ointment (KENALOG) 0.1 % MIX 1:1 WITH CERA-VE (OTC) AND APPLY TOPICALLY 2 (TWO) TIMES DAILY AS NEEDED. 454 g 0   Continuous Blood Gluc Sensor (FREESTYLE LIBRE 2 SENSOR) MISC USE 1 (ONE) EACH EVERY 2 WEEKS (Patient not taking: Reported on 07/05/2023) 2 each 11   No current facility-administered medications for this encounter.   BP (!) 156/64   Pulse 88   Wt 99.3 kg (219 lb)   LMP  (LMP Unknown)   SpO2 97%   BMI 37.59 kg/m  Vitals:   07/05/23 0916  BP: (!) 156/64  Pulse: 88  SpO2: 97%     Wt Readings from Last 3 Encounters:  07/05/23 99.3 kg (219 lb)  03/20/23 100 kg (220 lb 7.4 oz)  02/09/23 99.8 kg (220 lb)  General:  Arrived in a wheel  chair. No resp difficulty. Tearful  HEENT: normal Neck: supple. no JVD. Carotids 2+ bilat; no bruits. No lymphadenopathy or thryomegaly appreciated. Cor:  PMI nondisplaced. Regular rate & rhythm. No rubs, gallops or murmurs. Lungs: clear Abdomen: soft, nontender, nondistended. No hepatosplenomegaly. No bruits or masses. Good bowel sounds. Extremities: no cyanosis, clubbing, rash, R and LLE 1+ edema. R and L foot  orthotic shoe.  Neuro: alert & orientedx3, cranial nerves grossly intact. moves all 4 extremities w/o difficulty. Affect pleasant   Assessment/Plan: 1. Chronic diastolic CHF: LHC in 12/15 showing only 80% stenosis in small OM1. EF as low as 40-45% in the past. Echo 4/22 showed EF 55-60% with grade 2 diastolic dysfunction.   Echo 02/2023 EF 50-55%  NYHA II.  Cardiomems Reading 17 which is stable.  - Volume status mildly  elevated, suspect due to the food she is eating. Discussed low salt food choices.  Continue torsemide 100 mg twice a day.  - Not taking spiro.,Check BMET  - Add hydralazine 50 mg twice a day.   - Continue losartan 50 mg daily.  - Continue Jardiance 10 mg daily. No GU symptoms. - Off bisoprolol due to bradycardia.   - Check BMET  2. Atrial fibrillation: Paroxysmal. - Regular on exam.  - No longer amio. Restart elqiuis 5 mg twice a day.  3. PAD: Claudication bilaterally.  Not candidate for cilostazol with CHF.  She has had a left fem-pop bypass in 8/20.  ABIs in 2/21 were very abnormal on right. She underwent PV angiogram with balloon angioplasty of the right SFA 7/22.  8/22 dopplers still showed severe right SFA disease. Dopplers 2/23 showed patent right SFA.  Peripheral arterial dopplers in 2/23 showed 50-74% stenosis right SFA and popliteal.  She has no definite claudication, has knee pain and neuropathic pain in feet. Now with new ulcer on RLE. She is due for repeat ABIs. She is followed by Dr. Allyson Sabal. - [facility] - Continue rosuvastatin.  4.  COPD:  She has restarted smoking. Restart Wellbutrin 100 mg daily to help with cessation. . Discussed smoking cessation.    5. H/o cirrhosis: Likely from ETOH, she has quit.  6. OSA: Not able to tolerate CPAP.   7. CKD stage III:  Baseline SCr ~1.5 - Check BMET  8. HTN: Uncontrolled.  Restarting hydralazine 50 mg twice a day.  9.Type 2 diabetes: Per PCP.   10. CAD: LHC in 12/15 with 80% stenosis in small OM1, nonobstructive disease in other territories. No chest pain. - Continue rosuvastatin.  - Continue Eliquis. 11. Carotid stenosis: Stable in 3/23.   12. Hyperlipidemia: She is on Crestor and Vascepa.  - LDL 46 (1/24)    Check BMET. Restarting hydralazine, eliquis, and wellbutrin. Multiple medications issues. Needs to get back on track. Refer to HF Paramedicine.   Follow up in 6 weeks with APP. 3 months with Dr Shirlee Latch   Greater than 50% of the (total minutes 30) visit spent in counseling/coordination of care regarding ( details of discussion medications diet.)    Tonye Becket, NP 07/05/2023

## 2023-07-05 NOTE — Patient Instructions (Addendum)
Thank you for coming in today  If you had labs drawn today, any labs that are abnormal the clinic will call you No news is good news  Medications: Eliquis 5 mg twice daily  Hydralazine 50 mg twice daily  Wellbutrin 100 mg daily   Follow up appointments:  Your physician recommends that you schedule a follow-up appointment in:  6 weeks in clinic 3 months With Dr. Shirlee Latch    Do the following things EVERYDAY: Weigh yourself in the morning before breakfast. Write it down and keep it in a log. Take your medicines as prescribed Eat low salt foods--Limit salt (sodium) to 2000 mg per day.  Stay as active as you can everyday Limit all fluids for the day to less than 2 liters   At the [facility], you and your health needs are our priority. As part of our continuing mission to provide you with exceptional heart care, we have created designated Provider Care Teams. These Care Teams include your primary Cardiologist (physician) and Advanced Practice Providers (APPs- Physician Assistants and Nurse Practitioners) who all work together to provide you with the care you need, when you need it.   You may see any of the following providers on your designated Care Team at your next follow up: Dr Arvilla Meres Dr Marca Ancona Dr. Marcos Eke, NP Robbie Lis, [place] [facility] [place], [place] Brynda Peon, NP Karle Plumber, PharmD   Please be sure to bring in all your medications bottles to every appointment.    Thank you for choosing [facility]  If you have any questions or concerns before your next appointment please send [place] a message through [place] or call our office at [phone].    TO LEAVE A MESSAGE FOR THE NURSE SELECT OPTION 2, PLEASE LEAVE A MESSAGE INCLUDING: YOUR NAME DATE OF BIRTH CALL BACK NUMBER REASON FOR CALL**this is important as we prioritize the call backs  YOU WILL RECEIVE A CALL  BACK THE SAME DAY AS LONG AS YOU CALL BEFORE 4:00 PM

## 2023-07-05 NOTE — Telephone Encounter (Signed)
Sent patient's k-dur medication to her pharmacy. Pt is aware, agreeable, and verbalized understanding.

## 2023-07-05 NOTE — Telephone Encounter (Signed)
-----   Message from Tonye Becket sent at 07/05/2023 11:40 AM EDT ----- Renal function stable. No change.   K low. Start 20 meq K DUR twice a day. Please call.

## 2023-07-06 ENCOUNTER — Telehealth (HOSPITAL_COMMUNITY): Payer: Self-pay

## 2023-07-06 NOTE — Progress Notes (Signed)
[name], [name] ([id number]) [id number] Page 1 of 7 Visit Report for 07/05/2023 Arrival Information Details Patient Name: Date of Service: [name] 07/05/2023 1:30 PM Medical Record Number: [id number] Patient Account Number: [id number] Date of Birth/Sex: Treating RN: 04/12/1961 (62 y.o. F) Primary Care Jaleesa Cervi: [name] Other Clinician: Referring Zayvon Alicea: Treating Genecis Veley/Extender: [name], [name] Weeks in Treatment: 11 Visit Information History Since Last Visit Added or deleted any medications: No Patient Arrived: Walker Any new allergies or adverse reactions: No Arrival Time: 14:16 Had a fall or experienced change in No Accompanied By: self activities of daily living that may affect Transfer Assistance: None risk of falls: Patient Identification Verified: Yes Signs or symptoms of abuse/neglect since last visito No Secondary Verification Process Completed: Yes Hospitalized since last visit: No Patient Requires Transmission-Based Precautions: No Implantable device outside of the clinic excluding No Patient Has Alerts: Yes cellular tissue based products placed in the center Patient Alerts: ABI R 0.57 (01/11/23) since last visit: ABI L 1.04 (01/11/23) Has Dressing in Place as Prescribed: Yes Pain Present Now: Yes Electronic Signature(Price) Signed: 07/06/2023 5:02:38 PM By: Thayer Dallas Entered By: Thayer Dallas on 07/05/2023 11:16:49 -------------------------------------------------------------------------------- Encounter Discharge Information Details Patient Name: Date of Service: [name], [name] 07/05/2023 1:30 PM Medical Record Number: [id number] Patient Account Number: [id number] Date of Birth/Sex: Treating RN: 12/30/1960 (62 y.o. [name] Primary Care Tija Biss: [name] Other Clinician: Referring Georganna Maxson: Treating Jayjay Littles/Extender: Juleen China Weeks in Treatment:  11 Encounter Discharge Information Items Post Procedure Vitals Discharge Condition: Stable Temperature (F): 98.6 Ambulatory Status: Walker Pulse (bpm): 92 Discharge Destination: Home Respiratory Rate (breaths/min): 18 Transportation: Private Auto Blood Pressure (mmHg): 138/73 Accompanied By: self Schedule Follow-up Appointment: Yes Clinical Summary of Care: Patient Declined Electronic Signature(Price) Signed: 07/05/2023 5:05:26 PM By: Karie Schwalbe RN Entered By: Karie Schwalbe on 07/05/2023 14:04:41 [name] ([id number]) [id number].pdf Page 2 of 7 -------------------------------------------------------------------------------- Lower Extremity Assessment Details Patient Name: Date of Service: [name] 07/05/2023 1:30 PM Medical Record Number: [id number] Patient Account Number: [id number] Date of Birth/Sex: Treating RN: 06-Jun-1961 (62 y.o. F) Primary Care Teigan Manner: [name] Other Clinician: Referring Aidenjames Heckmann: Treating Jalacia Mattila/Extender: [name], [name] Weeks in Treatment: 11 Edema Assessment Assessed: [Left: No] [Right: No] Edema: [Left: Yes] [Right: Yes] Calf Left: Right: Point of Measurement: 30 cm From Medial Instep 38.5 cm 44.2 cm Ankle Left: Right: Point of Measurement: 9 cm From Medial Instep 24 cm 22 cm Vascular Assessment Extremity colors, hair growth, and conditions: Extremity Color: [Left:Hyperpigmented] [Right:Hyperpigmented] Hair Growth on Extremity: [Left:No] [Right:No] Temperature of Extremity: [Left:Warm] [Right:Warm] Capillary Refill: [Left:< 3 seconds] [Right:< 3 seconds] Dependent Rubor: [Left:No Yes] [Right:No Yes] Electronic Signature(Price) Signed: 07/06/2023 5:02:38 PM By: Thayer Dallas Entered By: Thayer Dallas on 07/05/2023 11:28:27 -------------------------------------------------------------------------------- Multi-Disciplinary Care Plan Details Patient Name: Date of Service: [name], DO  [name]. 07/05/2023 1:30 PM Medical Record Number: [id number] Patient Account Number: [id number] Date of Birth/Sex: Treating RN: [birth year] (62 y.o. [name] Primary Care Charda Janis: [name] Other Clinician: Referring Gwendolyn Nishi: Treating Lunah Losasso/Extender: [name], [name] Weeks in Treatment: 11 Active Inactive Wound/Skin Impairment Nursing Diagnoses: Impaired tissue integrity Goals: Patient/caregiver will verbalize understanding of skin care regimen Date Initiated: 04/19/2023 Target Resolution Date: 10/19/2023 Goal Status: Active Interventions: [name], [name] ([id number]) [phone].pdf Page 3 of 7 Assess ulceration(Price) every visit Treatment Activities: Skin care regimen initiated : 04/19/2023 Notes: Electronic Signature(Price) Signed: 07/05/2023 5:05:26 PM By: Baruch Merl,  Randa Evens RN Entered By: Karie Schwalbe on 07/05/2023 14:02:27 -------------------------------------------------------------------------------- Pain Assessment Details Patient Name: Date of Service: [name] 07/05/2023 1:30 PM Medical Record Number: [id number] Patient Account Number: [id number] Date of Birth/Sex: Treating RN: 1961-06-13 (62 y.o. F) Primary Care Vani Gunner: [name] Other Clinician: Referring Tyrese Ficek: Treating Earnestine Tuohey/Extender: [name], [name] Weeks in Treatment: 11 Active Problems Location of Pain Severity and Description of Pain Patient Has Paino Yes Site Locations Pain Location: Generalized Pain, Pain in Ulcers Rate the pain. Current Pain Level: 7 Character of Pain Describe the Pain: Aching, Burning Pain Management and Medication Current Pain Management: Electronic Signature(Price) Signed: 07/06/2023 5:02:38 PM By: Thayer Dallas Entered By: Thayer Dallas on 07/05/2023 11:17:31 -------------------------------------------------------------------------------- Patient/Caregiver Education Details Patient Name: Date of  Service: [name] 8/21/2024andnbsp1:30 PM Medical Record Number: [id number] Patient Account Number: [id number] [name], [name] ([id number]) [phone].pdf Page 4 of 7 Date of Birth/Gender: Treating RN: 08-28-1961 (62 y.o. [name] Primary Care Physician: [name] Other Clinician: Referring Physician: Treating Physician/Extender: Lance Morin in Treatment: 11 Education Assessment Education Provided To: Patient Education Topics Provided Wound/Skin Impairment: Methods: Explain/Verbal Responses: See progress note Electronic Signature(Price) Signed: 07/05/2023 5:05:26 PM By: Karie Schwalbe RN Entered By: Karie Schwalbe on 07/05/2023 14:02:42 -------------------------------------------------------------------------------- Wound Assessment Details Patient Name: Date of Service: [name], [name] 07/05/2023 1:30 PM Medical Record Number: [id number] Patient Account Number: [id number] Date of Birth/Sex: Treating RN: 1961/04/15 (61 y.o. F) Primary Care [name]: [name] Other Clinician: Referring Elyon Zoll: Treating [name]/Extender: [name], [name] Weeks in Treatment: 11 Wound Status Wound Number: 23 Primary Diabetic Wound/Ulcer of the Lower Extremity Etiology: Wound Location: Right Metatarsal head first Wound Open Wounding Event: Gradually Appeared Status: Date Acquired: 04/18/2021 Comorbid Chronic Obstructive Pulmonary Disease (COPD), Sleep Apnea, Weeks Of Treatment: 11 History: Arrhythmia, Congestive Heart Failure, Coronary Artery Disease, Clustered Wound: No Hypertension, Peripheral Arterial Disease, Cirrhosis , Type II Diabetes, Osteoarthritis, Neuropathy Photos Wound Measurements Length: (cm) 0.7 Width: (cm) 0.6 Depth: (cm) 0.3 Area: (cm) 0.33 Volume: (cm) 0.099 % Reduction in Area: 65% % Reduction in Volume: 79% Epithelialization: Small (1-33%) Tunneling: No Undermining:  No Wound Description Classification: Grade 2 Wound Margin: Distinct, outline attached Exudate Amount: Medium Exudate Type: Serosanguineous [name], [name] ([id number]) Exudate Color: red, brown Foul Odor After Cleansing: No Slough/Fibrino Yes [phone].pdf Page 5 of 7 Wound Bed Granulation Amount: Large (67-100%) Exposed Structure Necrotic Amount: Small (1-33%) Fascia Exposed: No Necrotic Quality: Adherent Slough Fat Layer (Subcutaneous Tissue) Exposed: Yes Tendon Exposed: No Muscle Exposed: No Joint Exposed: No Bone Exposed: No Periwound Skin Texture Texture Color No Abnormalities Noted: No No Abnormalities Noted: Yes Callus: Yes Temperature / Pain Temperature: No Abnormality Moisture No Abnormalities Noted: No Maceration: Yes Treatment Notes Wound #23 (Metatarsal head first) Wound Laterality: Right Cleanser Soap and Water Discharge Instruction: May shower and wash wound with dial antibacterial soap and water prior to dressing change. Wound Cleanser Discharge Instruction: Cleanse the wound with wound cleanser prior to applying a clean dressing using gauze sponges, not tissue or cotton balls. Peri-Wound Care Sween Lotion (Moisturizing lotion) Discharge Instruction: Apply moisturizing lotion as directed Topical Primary Dressing Promogran Prisma Matrix, 4.34 (sq in) (silver collagen) Discharge Instruction: Moisten collagen with saline or hydrogel Secondary Dressing Optifoam Non-Adhesive Dressing, 4x4 in Discharge Instruction: Apply foam donut and gauze Secured With Compression Wrap Kerlix Roll 4.5x3.1 (in/yd) Discharge Instruction: Apply Kerlix and Coban compression as directed. Coban Self-Adherent Wrap 4x5 (in/yd)  Discharge Instruction: Apply over Kerlix as directed. Compression Stockings Add-Ons Electronic Signature(Price) Signed: 07/06/2023 5:02:38 PM By: Thayer Dallas Entered By: Thayer Dallas on 07/05/2023  11:29:10 -------------------------------------------------------------------------------- Wound Assessment Details Patient Name: Date of Service: [name], [name] 07/05/2023 1:30 PM Medical Record Number: [id number] Patient Account Number: [id number] Date of Birth/Sex: Treating RN: 01/27/1961 (61 y.o. F) Primary Care Najae Filsaime: [name] Other Clinician: Iven Price ([id number]) [id number] Page 6 of 7 Referring Glendell Schlottman: Treating [name]/Extender: [name], [name] Weeks in Treatment: 11 Wound Status Wound Number: 28 Primary Diabetic Wound/Ulcer of the Lower Extremity Etiology: Wound Location: Left T Fourth oe Wound Healed - Epithelialized Wounding Event: Footwear Injury Status: Date Acquired: 06/26/2023 Comorbid Chronic Obstructive Pulmonary Disease (COPD), Sleep Apnea, Weeks Of Treatment: 1 History: Arrhythmia, Congestive Heart Failure, Coronary Artery Disease, Clustered Wound: No Hypertension, Peripheral Arterial Disease, Cirrhosis , Type II Diabetes, Osteoarthritis, Neuropathy Photos Wound Measurements Length: (cm) Width: (cm) Depth: (cm) Area: (cm) Volume: (cm) 0 % Reduction in Area: 100% 0 % Reduction in Volume: 100% 0 Epithelialization: Large (67-100%) 0 Tunneling: No 0 Undermining: No Wound Description Classification: Grade 1 Exudate Amount: None Present Foul Odor After Cleansing: No Slough/Fibrino No Wound Bed Granulation Amount: None Present (0%) Exposed Structure Necrotic Amount: None Present (0%) Fascia Exposed: No Fat Layer (Subcutaneous Tissue) Exposed: No Tendon Exposed: No Muscle Exposed: No Joint Exposed: No Bone Exposed: No Periwound Skin Texture Texture Color No Abnormalities Noted: No No Abnormalities Noted: No Callus: No Atrophie Blanche: No Crepitus: No Cyanosis: No Excoriation: No Ecchymosis: No Induration: No Erythema: No Rash: No Hemosiderin Staining: No Scarring: No Mottled:  No Pallor: No Moisture Rubor: No No Abnormalities Noted: No Dry / Scaly: No Temperature / Pain Maceration: No Temperature: No Abnormality Treatment Notes Wound #28 (Toe Fourth) Wound Laterality: Left Cleanser Peri-Wound Care Topical Primary Dressing Secondary Dressing MAKIYA, [name] ([id number]) [id number] Page 7 of 7 Secured With Compression Wrap Compression Stockings Facilities manager) Signed: 07/06/2023 5:02:38 PM By: Thayer Dallas Entered By: Thayer Dallas on 07/05/2023 11:43:52 -------------------------------------------------------------------------------- Vitals Details Patient Name: Date of Service: [name], [name] 07/05/2023 1:30 PM Medical Record Number: [id number] Patient Account Number: [id number] Date of Birth/Sex: Treating RN: [birth year] (61 y.o. F) Primary Care Evelyne Makepeace: [name] Other Clinician: Referring Kaisha Wachob: Treating Lyn Deemer/Extender: [name], [name] Weeks in Treatment: 11 Vital Signs Time Taken: 14:16 Temperature (F): 98.6 Height (in): 64 Pulse (bpm): 92 Weight (lbs): 225 Respiratory Rate (breaths/min): 18 Body Mass Index (BMI): 38.6 Blood Pressure (mmHg): 138/73 Reference Range: 80 - 120 mg / dl Electronic Signature(Price) Signed: 07/06/2023 5:02:38 PM By: Thayer Dallas Entered By: Thayer Dallas on 07/05/2023 11:17:18

## 2023-07-06 NOTE — Telephone Encounter (Signed)
I spoke to pt regarding re-referral to paramedicine.  She is agreeable to home visit on Monday.   Kerry Hough, EMT-Paramedic  [phone] 07/06/2023

## 2023-07-10 ENCOUNTER — Telehealth (HOSPITAL_COMMUNITY): Payer: Self-pay | Admitting: Licensed Clinical Social Worker

## 2023-07-10 ENCOUNTER — Other Ambulatory Visit (HOSPITAL_COMMUNITY): Payer: Self-pay

## 2023-07-10 NOTE — Telephone Encounter (Signed)
Patient identified as a candidate to receive 7 heart healthy meals per week for 4 weeks through [facility].  Completed referral sent in for review.  Anticipate patient will receive first shipment of food in 1-3 business days.  Jenna H. Uris, LCSW Clinical Social Worker [facility] Desk#: [phone] Cell#: [phone]  

## 2023-07-10 NOTE — Progress Notes (Unsigned)
[facility] [place]  ASSESSMENT / PLAN: [name] is a 62 y.o. female with atherosclerosis of native arteries of right lower extremity causing ulceration.  WIfI score calculated based on clinical exam and non-invasive measurements: 2 / 2 / 0. Stage III.  Recommend:  Abstinence from all tobacco products. Blood glucose control with goal A1c < 7%. Blood pressure control with goal blood pressure < 140/90 mmHg. Lipid reduction therapy with goal LDL-C <100 mg/dL  Aspirin 81mg  PO QD.  Atorvastatin 40-80mg  PO QD (or other "high intensity" statin therapy).  The patient is on best medical therapy for peripheral arterial disease. The patient has been counseled about the risks of tobacco use in atherosclerotic disease. The patient has been counseled to abstain from any tobacco use. An aortogram with bilateral lower extremity runoff angiography and Right lower extremity intervention and is indicated to better evaluate the patient's lower extremity circulation because of the limb threatening nature of the patient's diagnosis. Based on the patient's clinical exam and non-invasive data, we anticipate an endovascular intervention in the femoropopliteal and tibial vessels. Stenting and/or athrectomy would be favored because of the improved primary patency of these interventions as compared to plain balloon angioplasty.  CHIEF COMPLAINT: Right foot ulceration  HISTORY OF PRESENT ILLNESS: [name] is a 62 y.o. female who presents to clinic for evaluation of right foot ulceration after referral by PA Stone.  She is a prior patient of my partner, Dr. Tawanna Cooler Early who performed a left common femoral to posterior tibial artery bypass for limb threatening ischemia in 2020.  She has noticed development of a right plantar ulcer which is very painful to her.  She has previously undergone TMA.  I reviewed the rationale for an angiogram for limb salvage.  She is understanding and willing to  proceed.   Past Medical History:  Diagnosis Date   Alcohol abuse    Alcoholic cirrhosis ([name])    Anemia    Anxiety    Arthritis    "knees" (11/26/2018)   B12 deficiency    CAD (coronary artery disease)    a. 11/10/2014 Cath: LM nl, LAD min irregs, D1 30 ost, D2 50d, LCX 76m, OM1 80 p/m (1.5 mm vessel), OM2 40m, RCA nondom 72m-->med rx.. Demand ischemia in the setting of rapid a-fib.   Cardiomyopathy ([name])    Carotid artery disease ([name])    a. 01/2015 Carotid Angio: RICA 100, LICA 95p; b. 02/2015 s/p L CEA; c. 05/2019 Carotid U/S: RICA 100. RECA >50. LICA 1-39%.   Cellulitis    lower extremities   CHF (congestive heart failure) ([name])    Chronic combined systolic and diastolic CHF (congestive heart failure) ([name])    a. 10/2014 Echo: EF 40-45%; b. 10/2018 Echo: EF 45-50%, gr2 DD; c. 11/2019 Echo: EF 50%, mild LVH, gr2 DD (restrictive), antlat HK, Nl RV fxn. Mild BAE. RVSP 59.77mmHg.   CKD (chronic kidney disease), stage III ([name])    Cocaine abuse ([name])    COPD (chronic obstructive pulmonary disease) ([name])    Critical lower limb ischemia ([name])    Depression    Diabetes mellitus without complication ([name])    Diabetic peripheral neuropathy ([name])    DVT (deep venous thrombosis) ([name])    Dyspnea    Elevated troponin    a. Chronic elevation.   GERD (gastroesophageal reflux disease)    Hyperlipemia    Hypertension    Hypokalemia    Hypomagnesemia    Hypothyroidism  Marijuana abuse    Narcotic abuse ([name])    Noncompliance    NSVT (nonsustained ventricular tachycardia) ([name])    Obesity    PAF (paroxysmal atrial fibrillation) ([name])    Paroxysmal atrial tachycardia    Peripheral arterial disease ([name])    a. 01/2015 Angio/PTA: RSFA 99 (atherectomy/pta) - 1 vessel runoff via diff dzs peroneal; b. 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   Pneumonia    "once or twice" (11/26/2018)   Poorly controlled type 2 diabetes mellitus ([name])    Renal disorder    Renal  insufficiency    a. 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   Sleep apnea    "couldn't handle wearing the mask" (11/26/2018)   Symptomatic bradycardia    a. Avoid AV blocking agent per EP. Prev req temp wire in 2017.   Tobacco abuse     Past Surgical History:  Procedure Laterality Date   ABDOMINAL AORTOGRAM N/A 06/26/2019   Procedure: ABDOMINAL AORTOGRAM;  Surgeon: Iran Ouch, MD;  Location: [facility];  Service: Cardiovascular;  Laterality: N/A;   ABDOMINAL AORTOGRAM W/LOWER EXTREMITY N/A 06/07/2021   Procedure: ABDOMINAL AORTOGRAM W/LOWER EXTREMITY;  Surgeon: Runell Gess, MD;  Location: [facility];  Service: Cardiovascular;  Laterality: N/A;   AMPUTATION Right 06/14/2017   Procedure: Right foot transmetatarsal amputation;  Surgeon: Nadara Mustard, MD;  [facility];  Service: Orthopedics;  Laterality: Right;   AMPUTATION Left 06/28/2019   Procedure: AMPUTATION LEFT FIFTH TOE;  Surgeon: Larina Earthly, MD;  Location: [facility] OR;  Service: Vascular;  Laterality: Left;   AMPUTATION TOE Right 04/28/2017   Procedure: AMPUTATION OF RIGHT SECOND RAY;  Surgeon: Nadara Mustard, MD;  Location: [facility] OR;  Service: Orthopedics;  Laterality: Right;   CARDIAC CATHETERIZATION     CARDIAC CATHETERIZATION N/A 01/12/2016   Procedure: Temporary Wire;  Surgeon: Rollene Rotunda, MD;  Location: [facility];  Service: Cardiovascular;  Laterality: N/A;   CARDIOVERSION  ~ 02/2013   "twice"    CAROTID ANGIOGRAM N/A 01/15/2015   Procedure: CAROTID ANGIOGRAM;  Surgeon: Runell Gess, MD;  Location: [facility] CATH LAB;  Service: Cardiovascular;  Laterality: N/A;   DILATION AND CURETTAGE OF UTERUS  1988   ENDARTERECTOMY Left 02/19/2015   Procedure: LEFT CAROTID ENDARTERECTOMY ;  Surgeon: Nada Libman, MD;  Location: [facility] OR;  Service: Vascular;  Laterality: Left;   FEMORAL-TIBIAL BYPASS GRAFT Left 06/28/2019   Procedure: BYPASS GRAFT LEFT LEG FEMORAL TO ANTERIOR TIBIAL ARTERY using LEFT GREATER SAPHENOUS VEIN;   Surgeon: Larina Earthly, MD;  Location: [facility] OR;  Service: Vascular;  Laterality: Left;   LEFT HEART CATHETERIZATION WITH CORONARY ANGIOGRAM N/A 10/31/2014   Procedure: LEFT HEART CATHETERIZATION WITH CORONARY ANGIOGRAM;  Surgeon: Kathleene Hazel, MD;  Location: [facility] CATH LAB;  Service: Cardiovascular;  Laterality: N/A;   LOWER EXTREMITY ANGIOGRAM N/A 09/10/2013   Procedure: LOWER EXTREMITY ANGIOGRAM;  Surgeon: Runell Gess, MD;  Location: [facility] CATH LAB;  Service: Cardiovascular;  Laterality: N/A;   LOWER EXTREMITY ANGIOGRAM N/A 01/15/2015   Procedure: LOWER EXTREMITY ANGIOGRAM;  Surgeon: Runell Gess, MD;  Location: [facility] CATH LAB;  Service: Cardiovascular;  Laterality: N/A;   LOWER EXTREMITY ANGIOGRAPHY N/A 04/13/2017   Procedure: Lower Extremity Angiography;  Surgeon: Runell Gess, MD;  Location: [facility] INVASIVE CV LAB;  Service: Cardiovascular;  Laterality: N/A;   LOWER EXTREMITY ANGIOGRAPHY Left 06/26/2019   Procedure: LOWER EXTREMITY ANGIOGRAPHY;  Surgeon: Iran Ouch, MD;  Location: [facility] INVASIVE CV LAB;  Service: Cardiovascular;  Laterality: Left;   PERIPHERAL VASCULAR ATHERECTOMY Right 06/07/2021   Procedure: PERIPHERAL VASCULAR ATHERECTOMY;  Surgeon: Runell Gess, MD;  Location: [facility] INVASIVE CV LAB;  Service: Cardiovascular;  Laterality: Right;   PERIPHERAL VASCULAR BALLOON ANGIOPLASTY Left 06/26/2019   Procedure: PERIPHERAL VASCULAR BALLOON ANGIOPLASTY;  Surgeon: Iran Ouch, MD;  Location: [facility];  Service: Cardiovascular;  Laterality: Left;  unable to cross lt sfa occlusion   PERIPHERAL VASCULAR INTERVENTION  04/13/2017   Procedure: Peripheral Vascular Intervention;  Surgeon: Runell Gess, MD;  Location: [facility] INVASIVE CV LAB;  Service: Cardiovascular;;   PRESSURE SENSOR/CARDIOMEMS N/A 02/05/2020   Procedure: PRESSURE SENSOR/CARDIOMEMS;  Surgeon: Laurey Morale, MD;  Location: [facility] INVASIVE CV LAB;  Service: Cardiovascular;  Laterality: N/A;   VEIN HARVEST  Left 06/28/2019   Procedure: LEFT LEG GREATER SAPHENOUS VEIN HARVEST;  Surgeon: Larina Earthly, MD;  Location: [facility] OR;  Service: Vascular;  Laterality: Left;    Family History  Problem Relation Age of Onset   Hypertension Mother    Diabetes Mother    Cancer Mother        breast, ovarian, colon   Clotting disorder Mother    Heart disease Mother    Heart attack Mother    Breast cancer Mother        in 33's   Hypertension Father    Heart disease Father    Emphysema Sister        smoked    Social History   Socioeconomic History   Marital status: Single    Spouse name: Not on file   Number of children: 1   Years of education: 12   Highest education level: 12th grade  Occupational History   Occupation: disabled  Tobacco Use   Smoking status: Former    Current packs/day: 1.00    Average packs/day: 1 pack/day for 44.0 years (44.0 ttl pk-yrs)    Types: E-cigarettes, Cigarettes   Smokeless tobacco: Former    Types: Snuff  Vaping Use   Vaping status: Former   Devices: 11/26/2018 "stopped months ago"  Substance and Sexual Activity   Alcohol use: Not Currently    Comment: occ   Drug use: Not Currently    Types: "Crack" cocaine, Marijuana, Oxycodone   Sexual activity: Not Currently  Other Topics Concern   Not on file  Social History Narrative   ** Merged History Encounter **       Lives in [place], in motel with sister.  They are looking to move but don't have a place to go yet.     Social Determinants of Health   Financial Resource Strain: Low Risk  (02/09/2023)   Overall Financial Resource Strain (CARDIA)    Difficulty of Paying Living Expenses: Not hard at all  Food Insecurity: No Food Insecurity (02/09/2023)   Hunger Vital Sign    Worried About Running Out of Food in the Last Year: Never true    Ran Out of Food in the Last Year: Never true  Transportation Needs: No Transportation Needs (02/09/2023)   PRAPARE - Administrator, Civil Service (Medical): No    Lack  of Transportation (Non-Medical): No  Physical Activity: Inactive (02/09/2023)   Exercise Vital Sign    Days of Exercise per Week: 0 days    Minutes of Exercise per Session: 0 min  Stress: No Stress Concern Present (02/09/2023)  [employer] of Occupational Health - Occupational Stress Questionnaire    Feeling of Stress : Not at all  Social Connections: Not on file  Intimate Partner Violence: Not on file    Allergies  Allergen Reactions   Gabapentin Nausea And Vomiting and Other (See Comments)    POSSIBLE SHAKING   Lyrica [Pregabalin] Other (See Comments)    Shaking     Current Outpatient Medications  Medication Sig Dispense Refill   ACCU-CHEK GUIDE test strip USE TO CHECK BLOOD SUGAR FOUR TIMES DAILY     acetaminophen (TYLENOL) 500 MG tablet Take 1,000 mg by mouth every 6 (six) hours as needed for headache (pain).     albuterol (VENTOLIN HFA) 108 (90 Base) MCG/ACT inhaler USE 2 PUFFS BY MOUTH EVERY FOUR HOURS, AS NEEDED, FOR COUGHING/WHEEZING 8.5 g 3   apixaban (ELIQUIS) 5 MG TABS tablet Take 1 tablet (5 mg total) by mouth 2 (two) times daily. 60 tablet 6   Aspirin-Salicylamide-Caffeine (BC HEADACHE POWDER PO) Take 1 packet by mouth 4 (four) times daily as needed (pain/headache).     budesonide-formoterol (SYMBICORT) 160-4.5 MCG/ACT inhaler Inhale 2 puffs into the lungs 2 (two) times daily. 1 each 2   buPROPion ER (WELLBUTRIN SR) 100 MG 12 hr tablet Take 1 tablet (100 mg total) by mouth daily. 30 tablet 6   Cholecalciferol (VITAMIN D3 PO) Take 1 capsule by mouth every morning. (Patient not taking: Reported on 07/05/2023)     clindamycin (CLEOCIN) 300 MG capsule Take 300 mg by mouth 3 (three) times daily.     Continuous Blood Gluc Sensor (FREESTYLE LIBRE 2 SENSOR) MISC USE 1 (ONE) EACH EVERY 2 WEEKS (Patient not taking: Reported on 07/05/2023) 2 each 11   EASY COMFORT PEN NEEDLES 31G X 5 MM MISC USE FOUR TIMES A DAY FOR INSULIN ADMINISTRATION 100 each 1   hydrALAZINE (APRESOLINE)  50 MG tablet Take 1 tablet (50 mg total) by mouth 2 (two) times daily. 60 tablet 6   insulin lispro (HUMALOG) 100 UNIT/ML KwikPen Inject 10 Units into the skin 3 (three) times daily with meals. 15 mL 0   JARDIANCE 10 MG TABS tablet TAKE 1 TABLET (10 MG TOTAL) BY MOUTH DAILY BEFORE BREAKFAST (AM) 90 tablet 3   LANTUS SOLOSTAR 100 UNIT/ML Solostar Pen Inject 37 Units into the skin daily. 15 mL 0   levothyroxine (SYNTHROID) 50 MCG tablet TAKE 1 TABLET (50 MCG TOTAL) BY MOUTH DAILY BEFORE BREAKFAST (AM) 30 tablet 0   lidocaine (LIDODERM) 5 % Place 1 patch onto the skin daily. Remove & Discard patch within 12 hours or as directed by MD 30 patch 0   losartan (COZAAR) 25 MG tablet TAKE 2 TABLETS (50 MG TOTAL) BY MOUTH DAILY (AM) 60 tablet 0   Omega-3 Fatty Acids (FISH OIL PO) Take 1 capsule by mouth every morning.     ondansetron (ZOFRAN-ODT) 8 MG disintegrating tablet Take 1 tablet (8 mg total) by mouth every 8 (eight) hours as needed for nausea or vomiting. 20 tablet 0   pantoprazole (PROTONIX) 40 MG tablet TAKE 1 TABLET (40 MG TOTAL) BY MOUTH DAILY(AM) 30 tablet 0   potassium chloride SA (KLOR-CON M) 20 MEQ tablet Take 1 tablet (20 mEq total) by mouth 2 (two) times daily. 30 tablet 11   rosuvastatin (CRESTOR) 40 MG tablet Take 1 tablet (40 mg total) by mouth daily. 90 tablet 3   spironolactone (ALDACTONE) 25 MG tablet TAKE 0.5 TABLET (12.5 MG TOTAL) BY MOUTH DAILY (AM) (Patient not  taking: Reported on 07/05/2023) 45 tablet 3   torsemide (DEMADEX) 100 MG tablet TAKE 1 TABLET (100 MG TOTAL) BY MOUTH 2 (TWO) TIMES DAILY. (AM+BEDTIME) 60 tablet 3   triamcinolone ointment (KENALOG) 0.1 % MIX 1:1 WITH CERA-VE (OTC) AND APPLY TOPICALLY 2 (TWO) TIMES DAILY AS NEEDED. 454 g 0   No current facility-administered medications for this visit.    PHYSICAL EXAM Vitals:   07/11/23 1301  BP: (!) 144/80  Pulse: 92  Resp: 20  Temp: 98.3 F (36.8 C)  SpO2: 94%  Weight: 211 lb (95.7 kg)  Height: 5\' 4"  (1.626 m)     Chronically unwell woman.  Obese. Left dorsalis pedis pulse weakly palpable Left fifth toe surgically absent No palpable pulses in right foot Well-healed right transmetatarsal amputation   PERTINENT LABORATORY AND RADIOLOGIC DATA  Most recent CBC    Latest Ref Rng & Units 01/23/2023   10:35 PM 11/29/2022   10:47 AM 04/21/2022    3:59 AM  CBC  WBC 4.0 - 10.5 K/uL 10.5  10.6  8.6   Hemoglobin 12.0 - 15.0 g/dL 40.9  9.3  81.1   Hematocrit 36.0 - 46.0 % 32.0  29.7  33.8   Platelets 150 - 400 K/uL 277  391  216      Most recent CMP    Latest Ref Rng & Units 07/05/2023    9:55 AM 03/02/2023   12:36 PM 01/23/2023   10:35 PM  CMP  Glucose 70 - 99 mg/dL 914  782  956   BUN 6 - 20 mg/dL 59  49  52   Creatinine 0.44 - 1.00 mg/dL 2.13  0.86  5.78   Sodium 135 - 145 mmol/L 138  138  136   Potassium 3.5 - 5.1 mmol/L 3.3  4.5  4.3   Chloride 98 - 111 mmol/L 93  103  100   CO2 22 - 32 mmol/L 29  23  24    Calcium 8.9 - 10.3 mg/dL 9.5  8.7  8.8   Total Protein 6.5 - 8.1 g/dL   6.5   Total Bilirubin 0.3 - 1.2 mg/dL   0.2   Alkaline Phos 38 - 126 U/L   107   AST 15 - 41 U/L   15   ALT 0 - 44 U/L   14     Renal function Estimated Creatinine Clearance: 38.9 mL/min (A) (by C-G formula based on SCr of 1.76 mg/dL (H)).  Hemoglobin A1C (no units)  Date Value  06/21/2017 9.8   HbA1c, POC (controlled diabetic range) (%)  Date Value  09/13/2022 9.6 (A)    LDL Cholesterol  Date Value Ref Range Status  11/29/2022 46 0 - 99 mg/dL Final    Comment:           Total Cholesterol/HDL:CHD Risk Coronary Heart Disease Risk Table                     Men   Women  1/2 Average Risk   3.4   3.3  Average Risk       5.0   4.4  2 X Average Risk   9.6   7.1  3 X Average Risk  23.4   11.0        Use the calculated Patient Ratio above and the CHD Risk Table to determine the patient's CHD Risk.        ATP III CLASSIFICATION (LDL):  <100     mg/dL  Optimal  100-129  mg/dL   Near or Above                     Optimal  130-159  mg/dL   Borderline  409-811  mg/dL   High  >914     mg/dL   Very High Performed at [facility], [address] [place], [place] [address]    Direct LDL  Date Value Ref Range Status  04/04/2022 90.8 0 - 99 mg/dL Final    Comment:    Performed at [facility], [address] [place], [place] [address]    +-------+-----------+-----------+------------+------------+  ABI/TBIToday's ABIToday's TBIPrevious ABIPrevious TBI  +-------+-----------+-----------+------------+------------+  Right .57        amputation .69         amputation    +-------+-----------+-----------+------------+------------+  Left  1.04       .61        .94         1.0           +-------+-----------+-----------+------------+------------+   Lower extremity duplex Right: Moderate progression is noted compared to previous study.   Atherosclerosis throughout.  1-49% stenosis in the ostial SFA, s/p atherectomy and angioplasty.  75-99% stenosis in the proximal/mid SFA, s/p atherectomy and angioplasty.  1-49% stenosis in the distal SFA, s/p atherectomy and angioplasty.  30-49% stenosis in the proximal PTA.     Left: Mild progression is noted compared to previous study.    Atherosclerosis throughout.  Patent left femoral to ATA bypass graft with mildly elevated velocities  noted in the outflow consistent with 50-70% stenosis, low end range.    Rande Brunt. Lenell Antu, MD FACS [facility] Phone Number: [phone] 07/10/2023 10:44 AM   Total time spent on preparing this encounter including chart review, data review, collecting history, examining the patient, coordinating care for this new patient, 60 minutes.  Portions of this report may have been transcribed using voice recognition software.  Every effort has been made to ensure accuracy; however, inadvertent computerized transcription errors may still be  present.

## 2023-07-10 NOTE — Progress Notes (Signed)
Paramedicine Encounter    Patient ID: [name], female    DOB: 1961-02-25, 62 y.o.   MRN: [id number]   Complaints-diarrhea X 2 days   Edema+2 pitting edema to both legs   Compliance with meds-poor due to not having issues   Pill box filled-yes  If so, by whom-paramedic but missing a lot   Refills needed-see below  SOCIAL/MEDICAL BARRIERS:  PHARMACY USED -[facility]    MED ISSUES:  AFFORDABILITY-yes  changed insurance plans   PT ASSIST APPS NEEDED-  PCP-[facility]    INSURANCE-yes  SOURCE OF INCOME-disability [id number]) (Looking into pace program)   TRANSPORTATION-sister takes-[employer]   FOOD INSECURITIES/NEEDS-food banks-unhealthy options FOOD STAMPS-no-makes too much money   REVIEWED DIET/FLUID/SALT RESTRICTIONS-poor compliance due to food choices   RENT/OWN HOME ISSUES-yes--fixing to move again  SOCIAL SUPPORT-sister  SAFETY/DOMESTIC ISSUES-needs rail on steps-landlord told them would place last week but has yet to do to so   SUBSTANCE ABUSE-still smoking   DAILY WEIGHTS-  EDUCATE ON DISEASE PROCESS/SYMPTOMS/PURPOSES OF MEDS-yes   Pt referred to paramedicine again for medication issues.  She had changes insurance plans and didn't realize her meds wouldn't be covered.  She has not had her insulin due to costs-her sister is going to p/u today.  She is having issues with her salt and carb intake so she is questioning the meals on wheels again- so I asked her about the moms Dayna Ramus is interested in that. Will ask jenna about that referral.  [facility]s [facility] and [facility]--the ones from [facility] are 45 and 90 day supply.   Pt has 3 steps out front-she denies sob upon walking up those but is limited by her foot/leg pain. [facility] is going to [facility] [place].  Pt denies increased sob upon walking around, which she doesn't do much of considering her leg/foot pains.  Pt denies dizziness or c/p. Wheezing to rt side upper and lower  lobes.  Smoking 10 a day.  She has c/o diarrhea for past couple days. Denies fevers/n/v.  Eating and drinking ok.   Meds reviewed- Does not have: Needs freestyle sensor --$25  Refills needed- Symbicort eliquis Ventolin  Wellbutrin  Vit d3 spiro Hydralazine  Humalog Losartan Torsemide Potassium--rx only for 30 pills-only 15 days worth. Clinic will have to get new rx sent in with appropriate dosing and pill count  Spirivia -shows to be d/c   Went by pharmacy to get all this straight- he has a list of what she needs and what he can't fill there he will pull from [facility] and get filled. Will f/u.    CBG EMS-437   BP (!) 152/88   Pulse 82   LMP  (LMP Unknown)   SpO2 98%  Weight yesterday-? Last visit weight-219 @ clinic   Patient Care Team: [name], MD as PCP - General (Family Medicine) Runell Gess, MD as PCP - Cardiology (Cardiology) Lanier Prude, MD as PCP - Electrophysiology (Cardiology) Runell Gess, MD as Consulting Physician (Cardiology) Nyoka Cowden, MD as Consulting Physician (Pulmonary Disease) System, Provider Not In  Patient Active Problem List   Diagnosis Date Noted   Pre-ulcerative calluses 04/25/2022   Viral gastroenteritis 04/18/2022   Hyperlipidemia associated with type 2 diabetes mellitus ([name]) 07/29/2021   Uncontrolled type 2 diabetes mellitus with hyperglycemia ([name]) 06/13/2021   Acute kidney injury superimposed on CKD ([name]) 06/13/2021   Critical ischemia of foot ([name]) 06/07/2021   Fall 05/05/2021  PAF (paroxysmal atrial fibrillation) ([name]) 05/05/2021   COPD (chronic obstructive pulmonary disease) ([name]) 05/05/2021   DM (diabetes mellitus), type 2 with complications ([name]) 05/05/2021   PAD (peripheral artery disease) ([name]) 05/05/2021   Cellulitis 05/05/2021   Acute on chronic combined systolic and diastolic CHF (congestive heart failure) ([name]) 05/05/2021   OSA (obstructive sleep apnea) 05/05/2021   Stage 3b  chronic kidney disease ([name]) 05/05/2021   AKI (acute kidney injury) ([name]) 05/04/2021   Pressure injury of skin 05/04/2021   Ulcer of foot, unspecified laterality, limited to breakdown of skin ([name]) 02/17/2021   AF (paroxysmal atrial fibrillation) ([name]) 01/02/2021   CAD (coronary artery disease) 01/02/2021   PVD (peripheral vascular disease) ([name]) 01/02/2021   Diabetes mellitus type 2, uncontrolled, with complications 01/02/2021   Diabetic nephropathy ([name]) 01/02/2021   Low back pain 06/01/2020   Hyperlipidemia 04/03/2020   Muscle weakness 03/20/2020   Pain in elbow 03/12/2020   PAD (peripheral artery disease) ([name]) 12/26/2019   Acute renal failure ([name])    Acute respiratory failure with hypoxia ([name]) 12/02/2019   Cellulitis in diabetic foot ([name]) 07/08/2019   Osteomyelitis ([name]) 06/21/2019   NICM (nonischemic cardiomyopathy) ([name]) 06/20/2019   Non-healing ulcer ([name]) 06/20/2019   CHF (congestive heart failure) ([name]) 11/26/2018   Coagulation disorder ([name]) 08/09/2017   Depression 07/21/2017   At risk for adverse drug reaction 06/20/2017   Peripheral neuropathy 06/20/2017   S/P transmetatarsal amputation of foot, right ([name]) 06/05/2017   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) 05/19/2017   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[name])    SIRS (systemic inflammatory response syndrome) ([name]) 04/06/2017   Suspected sleep apnea 11/24/2016   Ulcer of toe of right foot, with necrosis of bone ([name]) 10/27/2016   Ulcer of left lower leg ([name]) 05/19/2016   Severe obesity (BMI >= 40) ([name]) 02/24/2016   Morbid obesity ([name]) 02/24/2016   Encounter for therapeutic drug monitoring 02/10/2016   Symptomatic bradycardia 01/12/2016   Hypertension associated with diabetes ([name]) 12/22/2015   Chronic combined systolic and diastolic CHF (congestive heart failure) ([name])    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 11/08/2015   Hypokalemia 11/08/2015   Tobacco abuse  10/23/2015   Coronary artery disease    DOE (dyspnea on exertion) 04/29/2015   Diabetes mellitus type 2, uncontrolled 02/08/2015   Influenza A 02/07/2015   Wrist fracture, left, closed, initial encounter 01/29/2015   PAF (paroxysmal atrial fibrillation) ([name]) 01/16/2015   Carotid arterial disease ([name]) 01/16/2015   Claudication ([name]) 01/15/2015   Demand ischemia 10/29/2014   Insomnia 02/03/2014   S/P peripheral artery angioplasty - TurboHawk atherectomy; R SFA 09/11/2013    Class: Acute   Leg pain, bilateral 08/19/2013   Hypothyroidism 07/31/2013   Cellulitis 06/13/2013   History of cocaine abuse ([name]) 06/13/2013   Long term current use of anticoagulant therapy 05/20/2013   Alcohol abuse    Narcotic abuse ([name])    Marijuana abuse    Alcoholic cirrhosis ([name])    DM (diabetes mellitus), type 2 with peripheral vascular complications ([name])     Current Outpatient Medications:    ACCU-CHEK GUIDE test strip, USE TO CHECK BLOOD SUGAR FOUR TIMES DAILY, Disp: , Rfl:    acetaminophen (TYLENOL) 500 MG tablet, Take 1,000 mg by mouth every 6 (six) hours as needed for headache (pain)., Disp: , Rfl:    albuterol (VENTOLIN HFA) 108 (90 Base) MCG/ACT inhaler, USE 2 PUFFS BY MOUTH EVERY FOUR HOURS, AS NEEDED, FOR COUGHING/WHEEZING, Disp: 8.5  g, Rfl: 3   budesonide-formoterol (SYMBICORT) 160-4.5 MCG/ACT inhaler, Inhale 2 puffs into the lungs 2 (two) times daily., Disp: 1 each, Rfl: 2   colchicine 0.6 MG tablet, Take 0.6 mg by mouth daily., Disp: , Rfl:    JARDIANCE 10 MG TABS tablet, TAKE 1 TABLET (10 MG TOTAL) BY MOUTH DAILY BEFORE BREAKFAST (AM), Disp: 90 tablet, Rfl: 3   levothyroxine (SYNTHROID) 50 MCG tablet, TAKE 1 TABLET (50 MCG TOTAL) BY MOUTH DAILY BEFORE BREAKFAST (AM), Disp: 30 tablet, Rfl: 0   losartan (COZAAR) 25 MG tablet, TAKE 2 TABLETS (50 MG TOTAL) BY MOUTH DAILY (AM), Disp: 60 tablet, Rfl: 0   Omega-3 Fatty Acids (FISH OIL PO), Take 1 capsule by mouth every morning., Disp: , Rfl:     ondansetron (ZOFRAN-ODT) 8 MG disintegrating tablet, Take 1 tablet (8 mg total) by mouth every 8 (eight) hours as needed for nausea or vomiting., Disp: 20 tablet, Rfl: 0   pantoprazole (PROTONIX) 40 MG tablet, TAKE 1 TABLET (40 MG TOTAL) BY MOUTH DAILY(AM), Disp: 30 tablet, Rfl: 0   potassium chloride SA (KLOR-CON M) 20 MEQ tablet, Take 1 tablet (20 mEq total) by mouth 2 (two) times daily., Disp: 30 tablet, Rfl: 11   rosuvastatin (CRESTOR) 40 MG tablet, Take 1 tablet (40 mg total) by mouth daily., Disp: 90 tablet, Rfl: 3   torsemide (DEMADEX) 100 MG tablet, TAKE 1 TABLET (100 MG TOTAL) BY MOUTH 2 (TWO) TIMES DAILY. (AM+BEDTIME), Disp: 60 tablet, Rfl: 3   triamcinolone ointment (KENALOG) 0.1 %, MIX 1:1 WITH CERA-VE (OTC) AND APPLY TOPICALLY 2 (TWO) TIMES DAILY AS NEEDED., Disp: 454 g, Rfl: 0   apixaban (ELIQUIS) 5 MG TABS tablet, Take 1 tablet (5 mg total) by mouth 2 (two) times daily., Disp: 60 tablet, Rfl: 6   Aspirin-Salicylamide-Caffeine (BC HEADACHE POWDER PO), Take 1 packet by mouth 4 (four) times daily as needed (pain/headache). (Patient not taking: Reported on 07/10/2023), Disp: , Rfl:    buPROPion ER (WELLBUTRIN SR) 100 MG 12 hr tablet, Take 1 tablet (100 mg total) by mouth daily., Disp: 30 tablet, Rfl: 6   Cholecalciferol (VITAMIN D3 PO), Take 1 capsule by mouth every morning. (Patient not taking: Reported on 07/05/2023), Disp: , Rfl:    clindamycin (CLEOCIN) 300 MG capsule, Take 300 mg by mouth 3 (three) times daily. (Patient not taking: Reported on 07/10/2023), Disp: , Rfl:    Continuous Blood Gluc Sensor (FREESTYLE LIBRE 2 SENSOR) MISC, USE 1 (ONE) EACH EVERY 2 WEEKS (Patient not taking: Reported on 07/05/2023), Disp: 2 each, Rfl: 11   EASY COMFORT PEN NEEDLES 31G X 5 MM MISC, USE FOUR TIMES A DAY FOR INSULIN ADMINISTRATION, Disp: 100 each, Rfl: 1   hydrALAZINE (APRESOLINE) 50 MG tablet, Take 1 tablet (50 mg total) by mouth 2 (two) times daily., Disp: 60 tablet, Rfl: 6   insulin lispro  (HUMALOG) 100 UNIT/ML KwikPen, Inject 10 Units into the skin 3 (three) times daily with meals., Disp: 15 mL, Rfl: 0   LANTUS SOLOSTAR 100 UNIT/ML Solostar Pen, Inject 37 Units into the skin daily., Disp: 15 mL, Rfl: 0   lidocaine (LIDODERM) 5 %, Place 1 patch onto the skin daily. Remove & Discard patch within 12 hours or as directed by MD, Disp: 30 patch, Rfl: 0   spironolactone (ALDACTONE) 25 MG tablet, TAKE 0.5 TABLET (12.5 MG TOTAL) BY MOUTH DAILY (AM) (Patient not taking: Reported on 07/05/2023), Disp: 45 tablet, Rfl: 3 Allergies  Allergen Reactions   Gabapentin Nausea And  Vomiting and Other (See Comments)    POSSIBLE SHAKING   Lyrica [Pregabalin] Other (See Comments)    Shaking       Social History   Socioeconomic History   Marital status: Single    Spouse name: Not on file   Number of children: 1   Years of education: 12   Highest education level: 12th grade  Occupational History   Occupation: disabled  Tobacco Use   Smoking status: Former    Current packs/day: 1.00    Average packs/day: 1 pack/day for 44.0 years (44.0 ttl pk-yrs)    Types: E-cigarettes, Cigarettes   Smokeless tobacco: Former    Types: Snuff  Vaping Use   Vaping status: Former   Devices: 11/26/2018 "stopped months ago"  Substance and Sexual Activity   Alcohol use: Not Currently    Comment: occ   Drug use: Not Currently    Types: "Crack" cocaine, Marijuana, Oxycodone   Sexual activity: Not Currently  Other Topics Concern   Not on file  Social History Narrative   ** Merged History Encounter **       Lives in [place], in motel with sister.  They are looking to move but don't have a place to go yet.     Social Determinants of Health   Financial Resource Strain: Low Risk  (02/09/2023)   Overall Financial Resource Strain (CARDIA)    Difficulty of Paying Living Expenses: Not hard at all  Food Insecurity: No Food Insecurity (02/09/2023)   Hunger Vital Sign    Worried About Running Out of Food in the Last  Year: Never true    Ran Out of Food in the Last Year: Never true  Transportation Needs: No Transportation Needs (02/09/2023)   PRAPARE - Administrator, Civil Service (Medical): No    Lack of Transportation (Non-Medical): No  Physical Activity: Inactive (02/09/2023)   Exercise Vital Sign    Days of Exercise per Week: 0 days    Minutes of Exercise per Session: 0 min  Stress: No Stress Concern Present (02/09/2023)   [employer] of Occupational Health - Occupational Stress Questionnaire    Feeling of Stress : Not at all  Social Connections: Not on file  Intimate Partner Violence: Not on file    Physical Exam      Future Appointments  Date Time Provider Department Center  07/11/2023  1:00 PM Leonie Douglas, MD [facility]  07/12/2023  2:45 PM Maxwell Caul, MD [facility]  07/19/2023  2:15 PM Allen Derry Kennedy III, PA-C [facility]  08/16/2023  2:00 PM [facility] PA/NP [facility] None  10/03/2023  2:20 PM Laurey Morale, MD [facility] None  02/12/2024  4:00 PM [facility] VISIT [facility] None       Kerry Hough, Paramedic [phone] [name] Paramedic  07/10/23

## 2023-07-11 ENCOUNTER — Telehealth (HOSPITAL_COMMUNITY): Payer: Self-pay | Admitting: Cardiology

## 2023-07-11 ENCOUNTER — Ambulatory Visit (INDEPENDENT_AMBULATORY_CARE_PROVIDER_SITE_OTHER): Payer: Medicare HMO | Admitting: Vascular Surgery

## 2023-07-11 ENCOUNTER — Encounter: Payer: Self-pay | Admitting: Vascular Surgery

## 2023-07-11 VITALS — BP 144/80 | HR 92 | Temp 98.3°F | Resp 20 | Ht 64.0 in | Wt 211.0 lb

## 2023-07-11 DIAGNOSIS — I70235 Atherosclerosis of native arteries of right leg with ulceration of other part of foot: Secondary | ICD-10-CM

## 2023-07-11 NOTE — Telephone Encounter (Signed)
-----   Message from [facility] [name] L sent at 07/10/2023  4:58 PM EDT ----- Regarding: rx Her potassium was sent in for only 15 day supply--can you send in for 30 day count or 90 day even better.  Also noticed in recent clinic visit note that she is on vascepa---she takes a fish oil-is that equivalent to a vascepa?  Thanks!   Kerry Hough, EMT-Paramedic  [phone] 07/10/2023

## 2023-07-12 ENCOUNTER — Other Ambulatory Visit: Payer: Self-pay | Admitting: Family Medicine

## 2023-07-12 ENCOUNTER — Other Ambulatory Visit (HOSPITAL_COMMUNITY): Payer: Self-pay

## 2023-07-12 ENCOUNTER — Telehealth: Payer: Self-pay

## 2023-07-12 ENCOUNTER — Encounter (HOSPITAL_BASED_OUTPATIENT_CLINIC_OR_DEPARTMENT_OTHER): Payer: Medicare HMO | Admitting: Internal Medicine

## 2023-07-12 DIAGNOSIS — J449 Chronic obstructive pulmonary disease, unspecified: Secondary | ICD-10-CM

## 2023-07-12 DIAGNOSIS — E11621 Type 2 diabetes mellitus with foot ulcer: Secondary | ICD-10-CM | POA: Diagnosis not present

## 2023-07-12 DIAGNOSIS — R059 Cough, unspecified: Secondary | ICD-10-CM

## 2023-07-12 MED ORDER — BUDESONIDE-FORMOTEROL FUMARATE 160-4.5 MCG/ACT IN AERO
2.0000 | INHALATION_SPRAY | Freq: Two times a day (BID) | RESPIRATORY_TRACT | 2 refills | Status: AC
Start: 2023-07-12 — End: ?

## 2023-07-12 NOTE — Telephone Encounter (Signed)
[facility] is needing refill for Symbicort sent to their pharmacy please.   Thanks!   Maralyn Sago, EMT-Paramedic [phone] 07/12/2023

## 2023-07-12 NOTE — Progress Notes (Signed)
I went by pharmacy to get the rest of her HF meds.  Pharmacy reported that eliquis was p/u when her insulin was p/u.  Once I got to her home to place meds in pill box, she reports it was only her insulin in the bag.  I informed pharmacy of this and he will fill it again.   I will p/u and bring back out.  She has appoint this afternoon so it will likely be tomor when I come back out.   Kerry Hough, EMT-Paramedic  [phone] 07/12/2023

## 2023-07-12 NOTE — Telephone Encounter (Signed)
Attempted to reach patient to schedule aortogram. Left VM for patient to return call.  

## 2023-07-12 NOTE — Telephone Encounter (Signed)
Pending order and sending to PCP for review. Pt was last seen 10/10/2022 and has 8 cancellations/no-shows since then. I cannot approve.

## 2023-07-12 NOTE — Addendum Note (Signed)
Addended by: Lois Huxley, Jeannett Senior L on: 07/12/2023 11:49 AM   Modules accepted: Orders

## 2023-07-13 ENCOUNTER — Other Ambulatory Visit (HOSPITAL_COMMUNITY): Payer: Self-pay

## 2023-07-13 ENCOUNTER — Other Ambulatory Visit: Payer: Self-pay

## 2023-07-13 DIAGNOSIS — I70235 Atherosclerosis of native arteries of right leg with ulceration of other part of foot: Secondary | ICD-10-CM

## 2023-07-13 NOTE — Progress Notes (Signed)
Came out today for med rec-pharm got her eliquis filled and her inhaler. Placed the eliquis in pill box, went ahead and filled up her pill box since Monday is holiday and I will not be here then. Will f/u early next week and to make the med changes needed for her procedure next week.    Kerry Hough, EMT-Paramedic  [phone] 07/13/2023

## 2023-07-19 ENCOUNTER — Encounter (HOSPITAL_BASED_OUTPATIENT_CLINIC_OR_DEPARTMENT_OTHER): Payer: 59 | Attending: Physician Assistant | Admitting: Physician Assistant

## 2023-07-19 DIAGNOSIS — L97812 Non-pressure chronic ulcer of other part of right lower leg with fat layer exposed: Secondary | ICD-10-CM | POA: Diagnosis not present

## 2023-07-19 DIAGNOSIS — N1832 Chronic kidney disease, stage 3b: Secondary | ICD-10-CM | POA: Insufficient documentation

## 2023-07-19 DIAGNOSIS — E11622 Type 2 diabetes mellitus with other skin ulcer: Secondary | ICD-10-CM | POA: Insufficient documentation

## 2023-07-19 DIAGNOSIS — E1122 Type 2 diabetes mellitus with diabetic chronic kidney disease: Secondary | ICD-10-CM | POA: Insufficient documentation

## 2023-07-19 DIAGNOSIS — I87331 Chronic venous hypertension (idiopathic) with ulcer and inflammation of right lower extremity: Secondary | ICD-10-CM | POA: Insufficient documentation

## 2023-07-19 DIAGNOSIS — I130 Hypertensive heart and chronic kidney disease with heart failure and stage 1 through stage 4 chronic kidney disease, or unspecified chronic kidney disease: Secondary | ICD-10-CM | POA: Insufficient documentation

## 2023-07-19 DIAGNOSIS — E11621 Type 2 diabetes mellitus with foot ulcer: Secondary | ICD-10-CM | POA: Diagnosis present

## 2023-07-19 DIAGNOSIS — L97512 Non-pressure chronic ulcer of other part of right foot with fat layer exposed: Secondary | ICD-10-CM | POA: Insufficient documentation

## 2023-07-19 DIAGNOSIS — Z89432 Acquired absence of left foot: Secondary | ICD-10-CM | POA: Insufficient documentation

## 2023-07-19 DIAGNOSIS — F17210 Nicotine dependence, cigarettes, uncomplicated: Secondary | ICD-10-CM | POA: Diagnosis not present

## 2023-07-19 DIAGNOSIS — J449 Chronic obstructive pulmonary disease, unspecified: Secondary | ICD-10-CM | POA: Diagnosis not present

## 2023-07-19 DIAGNOSIS — Z89431 Acquired absence of right foot: Secondary | ICD-10-CM | POA: Insufficient documentation

## 2023-07-19 DIAGNOSIS — L97522 Non-pressure chronic ulcer of other part of left foot with fat layer exposed: Secondary | ICD-10-CM | POA: Insufficient documentation

## 2023-07-19 NOTE — Progress Notes (Signed)
IZZAH, ASANTE ([id number]) [id number] Page 1 of 4 Visit Report for 07/19/2023 Chief Complaint Document Details Patient Name: Date of Service: Maryla Morrow 07/19/2023 2:15 PM Medical Record Number: [id number] Patient Account Number: [id number] Date of Birth/Sex: Treating RN: [birth year] (62 y.o. F) Primary Care Provider: [name] Other Clinician: Referring Provider: Treating Provider/Extender: [name], [name] Weeks in Treatment: 13 Information Obtained from: Patient Chief Complaint Bilateral LE Ulcers Electronic Signature(s) Signed: 07/19/2023 2:56:49 PM By: Allen Derry PA-C Entered By: Allen Derry on 07/19/2023 11:56:49 -------------------------------------------------------------------------------- Debridement Details Patient Name: Date of Service: [name], [name] 07/19/2023 2:15 PM Medical Record Number: [id number] Patient Account Number: [id number] Date of Birth/Sex: Treating RN: [birth year] (61 y.o. [name], Millard.Loa Primary Care Provider: [name] Other Clinician: Referring Provider: Treating Provider/Extender: Juleen China Weeks in Treatment: 13 Debridement Performed for Assessment: Wound #23 Right Metatarsal head first Performed By: Physician Lenda Kelp, PA Debridement Type: Debridement Severity of Tissue Pre Debridement: Fat layer exposed Level of Consciousness (Pre-procedure): Awake and Alert Pre-procedure Verification/Time Out Yes - 15:20 Taken: Start Time: 15:21 Pain Control: Lidocaine 4% T opical Solution Percent of Wound Bed Debrided: 100% T Area Debrided (cm): otal 0.24 Tissue and other material debrided: Viable, Non-Viable, Callus, Slough, Subcutaneous, Skin: Dermis , Skin: Epidermis, Slough Level: Skin/Subcutaneous Tissue Debridement Description: Excisional Instrument: Curette Bleeding: Minimum Hemostasis Achieved: Pressure End Time: 15:31 Procedural Pain: 0 Post Procedural  Pain: 0 Response to Treatment: Procedure was tolerated well Level of Consciousness (Post- Awake and Alert procedure): Post Debridement Measurements of Total Wound Length: (cm) 0.6 Width: (cm) 0.5 Depth: (cm) 0.3 Volume: (cm) 0.071 Character of Wound/Ulcer Post Debridement: Improved JOBI, BRAZEL ([id number]) [id number] Page 2 of 4 Severity of Tissue Post Debridement: Fat layer exposed Post Procedure Diagnosis Same as Pre-procedure Electronic Signature(s) Unsigned Entered By: Shawn Stall on 07/19/2023 12:31:48 -------------------------------------------------------------------------------- Physician Orders Details Patient Name: Date of Service: HA LEY, [name] 07/19/2023 2:15 PM Medical Record Number: [id number] Patient Account Number: [id number] Date of Birth/Sex: Treating RN: 1961-08-13 (62 y.o. [name], Millard.Loa Primary Care Provider: [name] Other Clinician: Referring Provider: Treating Provider/Extender: Juleen China Weeks in Treatment: 7 Verbal / Phone Orders: No Diagnosis Coding ICD-10 Coding Code Description 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 L97.522 Non-pressure chronic ulcer of other part of left foot with fat layer exposed E11.622 Type 2 diabetes mellitus with other skin ulcer L97.812 Non-pressure chronic ulcer of other part of right lower leg with fat layer exposed I73.89 Other specified peripheral vascular diseases I87.331 Chronic venous hypertension (idiopathic) with ulcer and inflammation of right lower extremity Follow-up Appointments ppointment in 1 week. Allen Derry PA Wednesday 07/26/2023 (already scheduled) Return A ppointment in 2 weeks. Allen Derry PA Wednesday (already scheduled) Return A Return appointment in 3 weeks. Allen Derry PA Wednesday (already scheduled) Anesthetic (In clinic)  Topical Lidocaine 5% applied to wound bed Bathing/ Shower/ Hygiene May shower with protection but do not get wound dressing(s) wet. Protect dressing(s) with water repellant cover (for example, large plastic bag) or a cast cover and may then take shower. Edema Control - Lymphedema / SCD / Other Bilateral Lower Extremities Elevate legs to the level of the heart or above for 30 minutes daily and/or when sitting for 3-4 times a day throughout the day. Avoid standing for long periods of time. Patient  to wear own compression stockings every day. Exercise regularly - As tolerated Moisturize legs daily. Other Edema Control Orders/Instructions: - Recommend Compression stocking 15-59mm/Hg. Brochure for [employer] with printed leg measurements. Wound Treatment Wound #23 - Metatarsal head first Wound Laterality: Right Cleanser: Soap and Water 1 x Per Week/30 Days Discharge Instructions: May shower and wash wound with dial antibacterial soap and water prior to dressing change. Cleanser: Wound Cleanser 1 x Per Week/30 Days Discharge Instructions: Cleanse the wound with wound cleanser prior to applying a clean dressing using gauze sponges, not tissue or cotton balls. Peri-Wound Care: Sween Lotion (Moisturizing lotion) 1 x Per Week/30 Days Discharge Instructions: Apply moisturizing lotion as directed Prim Dressing: Promogran Prisma Matrix, 4.34 (sq in) (silver collagen) ary 1 x Per Week/30 Days RIVKY, DECHERT ([id number]) [id number] Page 3 of 4 Discharge Instructions: Moisten collagen with saline or hydrogel Secondary Dressing: Optifoam Non-Adhesive Dressing, 4x4 in 1 x Per Week/30 Days Discharge Instructions: Apply foam donut and gauze Compression Wrap: Kerlix Roll 4.5x3.1 (in/yd) 1 x Per Week/30 Days Discharge Instructions: Apply Kerlix and Coban compression as directed. Compression Wrap: Coban Self-Adherent Wrap 4x5 (in/yd) 1 x Per Week/30 Days Discharge  Instructions: Apply over Kerlix as directed. Electronic Signature(s) Signed: 07/19/2023 4:01:32 PM By: Karie Schwalbe RN Entered By: Karie Schwalbe on 07/19/2023 12:29:26 -------------------------------------------------------------------------------- Problem List Details Patient Name: Date of Service: [name], [name] 07/19/2023 2:15 PM Medical Record Number: [id number] Patient Account Number: [id number] Date of Birth/Sex: Treating RN: 05-16-1961 (61 y.o. F) Primary Care Provider: [name] Other Clinician: Referring Provider: Treating Provider/Extender: [name], [name] Weeks in Treatment: 13 Active Problems ICD-10 Encounter Code Description Active Date MDM Diagnosis 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 2 diabetes mellitus with foot ulcer 04/19/2023 No Yes L97.512 Non-pressure chronic ulcer of other part of right foot with fat layer exposed 04/19/2023 No Yes L97.522 Non-pressure chronic ulcer of other part of left foot with fat layer exposed 04/19/2023 No Yes E11.622 Type 2 diabetes mellitus with other skin ulcer 04/19/2023 No Yes L97.812 Non-pressure chronic ulcer of other part of right lower leg with fat layer 04/19/2023 No Yes exposed I73.89 Other specified peripheral vascular diseases 04/19/2023 No Yes I87.331 Chronic venous hypertension (idiopathic) with ulcer and inflammation of right 04/19/2023 No Yes lower extremity Inactive Problems Resolved Problems NERIAH, ARGETSINGER ([id number]) [id number] Page 4 of 4 Electronic Signature(s) Signed: 07/19/2023 2:56:39 PM By: Allen Derry PA-C Entered By: Allen Derry on 07/19/2023 11:56:39 -------------------------------------------------------------------------------- SuperBill Details Patient Name: Date of Service: HA LEY, [name] 07/19/2023 Medical Record Number: [id number] Patient Account Number: [id number] Date of Birth/Sex: Treating RN: Jul 28, 1961 (62 y.o. [name], Millard.Loa Primary Care Provider: [name] Other  Clinician: Referring Provider: Treating Provider/Extender: Juleen China Weeks in Treatment: 13 Diagnosis Coding ICD-10 Codes Code Description 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 L97.522 Non-pressure chronic ulcer of other part of left foot with fat layer exposed E11.622 Type 2 diabetes mellitus with other skin ulcer L97.812 Non-pressure chronic ulcer of other part of right lower leg with fat layer exposed I73.89 Other specified peripheral vascular diseases I87.331 Chronic venous hypertension (idiopathic) with ulcer and inflammation of right lower extremity Facility Procedures : CPT4 Code: [id number] Description: 11042 - DEB SUBQ TISSUE 20 SQ CM/< ICD-10 Diagnosis Description L97.512 Non-pressure chronic ulcer of other part of right foot with fat layer exposed 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 2 diabetes mellitus with foot ulcer Modifier:  Quantity: 1 Physician Procedures : CPT4 Code Description Modifier [id number] 11042 - WC PHYS SUBQ TISS 20 SQ CM ICD-10 Diagnosis Description L97.512 Non-pressure chronic ulcer of other part of right foot with fat layer exposed 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 2 diabetes mellitus with foot ulcer Quantity: 1 Electronic Signature(s) Unsigned Entered By: Shawn Stall on 07/19/2023 12:32:31 Signature(s): Date(s):

## 2023-07-20 ENCOUNTER — Telehealth: Payer: Self-pay

## 2023-07-20 ENCOUNTER — Other Ambulatory Visit (HOSPITAL_COMMUNITY): Payer: Self-pay

## 2023-07-20 NOTE — Telephone Encounter (Signed)
Pts insurance plan terminated on 07/15/2023.  Patient says she has new [facility] that should have kicked in on 07/16/23.  Upon checking with [facility] Rep, they do not have a record of her signing up for new insurance.  I called pt and left a voice message for her to call me back with new insurance number.

## 2023-07-20 NOTE — Progress Notes (Signed)
Paramedicine Encounter    Patient ID: [name], female    DOB: 08-Feb-1961, 62 y.o.   MRN: [id number]   Complaints-foot pain   Edema-yes  Compliance with meds-yes  Pill box filled-yes  If so, by whom-paramedic   Refills needed-potassium -called in and asked to deliver please -needs in pill box from sun pm for rest of week  She knows where it needs to go and will place it once it delivers.   Pt reports she is doing ok other than the foot pain. She was suppose to have the vein procedure tomor but it got cancelled b/c of her insurance information. I advised her to reach out to both [place] and [facility] to confirm insurance plans. She does not have [employer] card yet.   Meds verified and pill box refilled   Pt denies c/p, no dizziness. Sob is fine.   Lungs-wheezy to lower lobes    CBG PTA-164 BP (!) 110/58   Pulse 74   Resp 18   LMP  (LMP Unknown)   SpO2 98%  Weight yesterday-? Last visit weight-219  Patient Care Team: [name], MD as PCP - General (Family Medicine) Runell Gess, MD as PCP - Cardiology (Cardiology) Lanier Prude, MD as PCP - Electrophysiology (Cardiology) Runell Gess, MD as Consulting Physician (Cardiology) Nyoka Cowden, MD as Consulting Physician (Pulmonary Disease) System, Provider Not In  Patient Active Problem List   Diagnosis Date Noted   Pre-ulcerative calluses 04/25/2022   Viral gastroenteritis 04/18/2022   Hyperlipidemia associated with type 2 diabetes mellitus ([name]) 07/29/2021   Uncontrolled type 2 diabetes mellitus with hyperglycemia ([name]) 06/13/2021   Acute kidney injury superimposed on CKD ([name]) 06/13/2021   Critical ischemia of foot ([name]) 06/07/2021   Fall 05/05/2021   PAF (paroxysmal atrial fibrillation) ([name]) 05/05/2021   COPD (chronic obstructive pulmonary disease) ([name]) 05/05/2021   DM (diabetes mellitus), type 2 with complications ([name]) 05/05/2021   PAD (peripheral artery disease) ([name]) 05/05/2021   Cellulitis  05/05/2021   Acute on chronic combined systolic and diastolic CHF (congestive heart failure) ([name]) 05/05/2021   OSA (obstructive sleep apnea) 05/05/2021   Stage 3b chronic kidney disease ([name]) 05/05/2021   AKI (acute kidney injury) ([name]) 05/04/2021   Pressure injury of skin 05/04/2021   Ulcer of foot, unspecified laterality, limited to breakdown of skin ([name]) 02/17/2021   AF (paroxysmal atrial fibrillation) ([name]) 01/02/2021   CAD (coronary artery disease) 01/02/2021   PVD (peripheral vascular disease) ([name]) 01/02/2021   Diabetes mellitus type 2, uncontrolled, with complications 01/02/2021   Diabetic nephropathy ([name]) 01/02/2021   Low back pain 06/01/2020   Hyperlipidemia 04/03/2020   Muscle weakness 03/20/2020   Pain in elbow 03/12/2020   PAD (peripheral artery disease) ([name]) 12/26/2019   Acute renal failure ([name])    Acute respiratory failure with hypoxia ([name]) 12/02/2019   Cellulitis in diabetic foot ([name]) 07/08/2019   Osteomyelitis ([name]) 06/21/2019   NICM (nonischemic cardiomyopathy) ([name]) 06/20/2019   Non-healing ulcer ([name]) 06/20/2019   CHF (congestive heart failure) ([name]) 11/26/2018   Coagulation disorder ([name]) 08/09/2017   Depression 07/21/2017   At risk for adverse drug reaction 06/20/2017   Peripheral neuropathy 06/20/2017   S/P transmetatarsal amputation of foot, right ([name]) 06/05/2017   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) 05/19/2017   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[name])    SIRS (systemic inflammatory response syndrome) ([name]) 04/06/2017   Suspected sleep apnea 11/24/2016   Ulcer of toe  of right foot, with necrosis of bone ([name]) 10/27/2016   Ulcer of left lower leg ([name]) 05/19/2016   Severe obesity (BMI >= 40) ([name]) 02/24/2016   Morbid obesity ([name]) 02/24/2016   Encounter for therapeutic drug monitoring 02/10/2016   Symptomatic bradycardia 01/12/2016   Hypertension associated with diabetes ([name]) 12/22/2015    Chronic combined systolic and diastolic CHF (congestive heart failure) ([name])    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 11/08/2015   Hypokalemia 11/08/2015   Tobacco abuse 10/23/2015   Coronary artery disease    DOE (dyspnea on exertion) 04/29/2015   Diabetes mellitus type 2, uncontrolled 02/08/2015   Influenza A 02/07/2015   Wrist fracture, left, closed, initial encounter 01/29/2015   PAF (paroxysmal atrial fibrillation) ([name]) 01/16/2015   Carotid arterial disease ([name]) 01/16/2015   Claudication ([name]) 01/15/2015   Demand ischemia 10/29/2014   Insomnia 02/03/2014   S/P peripheral artery angioplasty - TurboHawk atherectomy; R SFA 09/11/2013    Class: Acute   Leg pain, bilateral 08/19/2013   Hypothyroidism 07/31/2013   Cellulitis 06/13/2013   History of cocaine abuse ([name]) 06/13/2013   Long term current use of anticoagulant therapy 05/20/2013   Alcohol abuse    Narcotic abuse ([name])    Marijuana abuse    Alcoholic cirrhosis ([name])    DM (diabetes mellitus), type 2 with peripheral vascular complications ([name])     Current Outpatient Medications:    ACCU-CHEK GUIDE test strip, USE TO CHECK BLOOD SUGAR FOUR TIMES DAILY, Disp: , Rfl:    acetaminophen (TYLENOL) 500 MG tablet, Take 1,000 mg by mouth every 6 (six) hours as needed for headache (pain)., Disp: , Rfl:    albuterol (VENTOLIN HFA) 108 (90 Base) MCG/ACT inhaler, USE 2 PUFFS BY MOUTH EVERY FOUR HOURS, AS NEEDED, FOR COUGHING/WHEEZING, Disp: 8.5 g, Rfl: 0   apixaban (ELIQUIS) 5 MG TABS tablet, Take 1 tablet (5 mg total) by mouth 2 (two) times daily., Disp: 60 tablet, Rfl: 6   budesonide-formoterol (SYMBICORT) 160-4.5 MCG/ACT inhaler, Inhale 2 puffs into the lungs 2 (two) times daily., Disp: 1 each, Rfl: 2   buPROPion ER (WELLBUTRIN SR) 100 MG 12 hr tablet, Take 1 tablet (100 mg total) by mouth daily., Disp: 30 tablet, Rfl: 6   Cholecalciferol (VITAMIN D3 PO), Take 1 capsule by mouth every morning., Disp: , Rfl:    colchicine 0.6 MG tablet, Take  1.2 mg by mouth See admin instructions. Take 1.2 mg for flair up and an hour later take 0.6 mg if needed, Disp: , Rfl:    hydrALAZINE (APRESOLINE) 50 MG tablet, Take 1 tablet (50 mg total) by mouth 2 (two) times daily., Disp: 60 tablet, Rfl: 6   insulin lispro (HUMALOG) 100 UNIT/ML KwikPen, Inject 10 Units into the skin 3 (three) times daily with meals., Disp: 15 mL, Rfl: 0   JARDIANCE 10 MG TABS tablet, TAKE 1 TABLET (10 MG TOTAL) BY MOUTH DAILY BEFORE BREAKFAST (AM), Disp: 90 tablet, Rfl: 3   LANTUS SOLOSTAR 100 UNIT/ML Solostar Pen, Inject 37 Units into the skin daily. (Patient taking differently: Inject 38 Units into the skin daily.), Disp: 15 mL, Rfl: 0   levothyroxine (SYNTHROID) 50 MCG tablet, TAKE 1 TABLET (50 MCG TOTAL) BY MOUTH DAILY BEFORE BREAKFAST (AM), Disp: 30 tablet, Rfl: 0   losartan (COZAAR) 25 MG tablet, TAKE 2 TABLETS (50 MG TOTAL) BY MOUTH DAILY (AM), Disp: 60 tablet, Rfl: 0   Omega-3 Fatty Acids (FISH OIL PO), Take 1 capsule by mouth every morning., Disp: , Rfl:  pantoprazole (PROTONIX) 40 MG tablet, TAKE 1 TABLET (40 MG TOTAL) BY MOUTH DAILY(AM), Disp: 30 tablet, Rfl: 0   potassium chloride SA (KLOR-CON M) 20 MEQ tablet, Take 1 tablet (20 mEq total) by mouth 2 (two) times daily., Disp: 30 tablet, Rfl: 11   rosuvastatin (CRESTOR) 40 MG tablet, Take 1 tablet (40 mg total) by mouth daily., Disp: 90 tablet, Rfl: 3   spironolactone (ALDACTONE) 25 MG tablet, TAKE 0.5 TABLET (12.5 MG TOTAL) BY MOUTH DAILY (AM), Disp: 45 tablet, Rfl: 3   torsemide (DEMADEX) 100 MG tablet, TAKE 1 TABLET (100 MG TOTAL) BY MOUTH 2 (TWO) TIMES DAILY. (AM+BEDTIME), Disp: 60 tablet, Rfl: 3   triamcinolone ointment (KENALOG) 0.1 %, MIX 1:1 WITH CERA-VE (OTC) AND APPLY TOPICALLY 2 (TWO) TIMES DAILY AS NEEDED., Disp: 454 g, Rfl: 0   VITAMIN E PO, Take 1 tablet by mouth daily., Disp: , Rfl:    Continuous Blood Gluc Sensor (FREESTYLE LIBRE 2 SENSOR) MISC, USE 1 (ONE) EACH EVERY 2 WEEKS (Patient not taking:  Reported on 07/05/2023), Disp: 2 each, Rfl: 11   EASY COMFORT PEN NEEDLES 31G X 5 MM MISC, USE FOUR TIMES A DAY FOR INSULIN ADMINISTRATION, Disp: 100 each, Rfl: 1   lidocaine (LIDODERM) 5 %, Place 1 patch onto the skin daily. Remove & Discard patch within 12 hours or as directed by MD (Patient not taking: Reported on 07/14/2023), Disp: 30 patch, Rfl: 0   ondansetron (ZOFRAN-ODT) 8 MG disintegrating tablet, Take 1 tablet (8 mg total) by mouth every 8 (eight) hours as needed for nausea or vomiting., Disp: 20 tablet, Rfl: 0   OVER THE COUNTER MEDICATION, Take 2 tablets by mouth daily. Total beets chew, Disp: , Rfl:  Allergies  Allergen Reactions   Gabapentin Nausea And Vomiting and Other (See Comments)    POSSIBLE SHAKING   Lyrica [Pregabalin] Other (See Comments)    Shaking       Social History   Socioeconomic History   Marital status: Single    Spouse name: Not on file   Number of children: 1   Years of education: 12   Highest education level: 12th grade  Occupational History   Occupation: disabled  Tobacco Use   Smoking status: Former    Current packs/day: 1.00    Average packs/day: 1 pack/day for 44.0 years (44.0 ttl pk-yrs)    Types: E-cigarettes, Cigarettes   Smokeless tobacco: Former    Types: Snuff  Vaping Use   Vaping status: Former   Devices: 11/26/2018 "stopped months ago"  Substance and Sexual Activity   Alcohol use: Not Currently    Comment: occ   Drug use: Not Currently    Types: "Crack" cocaine, Marijuana, Oxycodone   Sexual activity: Not Currently  Other Topics Concern   Not on file  Social History Narrative   ** Merged History Encounter **       Lives in [place], in motel with sister.  They are looking to move but don't have a place to go yet.     Social Determinants of Health   Financial Resource Strain: Low Risk  (02/09/2023)   Overall Financial Resource Strain (CARDIA)    Difficulty of Paying Living Expenses: Not hard at all  Food Insecurity: No Food  Insecurity (02/09/2023)   Hunger Vital Sign    Worried About Running Out of Food in the Last Year: Never true    Ran Out of Food in the Last Year: Never true  Transportation Needs: No Transportation Needs (  02/09/2023)   PRAPARE - Administrator, Civil Service (Medical): No    Lack of Transportation (Non-Medical): No  Physical Activity: Inactive (02/09/2023)   Exercise Vital Sign    Days of Exercise per Week: 0 days    Minutes of Exercise per Session: 0 min  Stress: No Stress Concern Present (02/09/2023)   [employer] of Occupational Health - Occupational Stress Questionnaire    Feeling of Stress : Not at all  Social Connections: Not on file  Intimate Partner Violence: Not on file    Physical Exam      Future Appointments  Date Time Provider Department Center  07/26/2023 10:15 AM Allen Derry Hornitos III, PA-C [facility]  08/02/2023  1:15 PM Allen Derry [place] III, PA-C [facility]  08/09/2023  2:00 PM Jeronimo Norma [facility]  08/16/2023  2:00 PM [facility] PA/NP [facility] None  10/03/2023  2:20 PM Laurey Morale, MD [facility] None  02/12/2024  4:00 PM [facility] VISIT [facility] None       Kerry Hough, Paramedic [phone] [facility] Paramedic  07/20/23

## 2023-07-20 NOTE — Telephone Encounter (Signed)
Pt says she is aware she will not have coverage for her Angio. She claims she should have [employer] coverage again on 07/29/23.

## 2023-07-21 ENCOUNTER — Ambulatory Visit (HOSPITAL_COMMUNITY): Admit: 2023-07-21 | Payer: Medicare HMO | Admitting: Vascular Surgery

## 2023-07-21 ENCOUNTER — Encounter (HOSPITAL_COMMUNITY): Payer: Self-pay

## 2023-07-21 ENCOUNTER — Telehealth (HOSPITAL_COMMUNITY): Payer: Self-pay

## 2023-07-21 DIAGNOSIS — I5042 Chronic combined systolic (congestive) and diastolic (congestive) heart failure: Secondary | ICD-10-CM

## 2023-07-21 SURGERY — ABDOMINAL AORTOGRAM W/LOWER EXTREMITY
Anesthesia: LOCAL

## 2023-07-21 MED ORDER — POTASSIUM CHLORIDE CRYS ER 20 MEQ PO TBCR
20.0000 meq | EXTENDED_RELEASE_TABLET | Freq: Two times a day (BID) | ORAL | 3 refills | Status: AC
Start: 2023-07-21 — End: ?

## 2023-07-21 NOTE — Addendum Note (Signed)
Addended by: Derin Matthes, Milagros Reap on: 07/21/2023 03:16 PM   Modules accepted: Orders

## 2023-07-21 NOTE — Telephone Encounter (Signed)
90day supply sent.

## 2023-07-21 NOTE — Telephone Encounter (Signed)
[facility] advised they need a 90 day supply for potassium for patient sent into their pharmacy if able-please.   Last quantity was for 15 days only.   Thanks!   Maralyn Sago, EMT-Paramedic [phone] 07/21/2023

## 2023-07-25 ENCOUNTER — Other Ambulatory Visit: Payer: Self-pay

## 2023-07-25 DIAGNOSIS — I70235 Atherosclerosis of native arteries of right leg with ulceration of other part of foot: Secondary | ICD-10-CM

## 2023-07-26 ENCOUNTER — Encounter (HOSPITAL_BASED_OUTPATIENT_CLINIC_OR_DEPARTMENT_OTHER): Payer: 59 | Admitting: Physician Assistant

## 2023-07-26 DIAGNOSIS — E11621 Type 2 diabetes mellitus with foot ulcer: Secondary | ICD-10-CM | POA: Diagnosis not present

## 2023-07-26 NOTE — Progress Notes (Signed)
RYA, BISAILLON ([id number]) [id number] Page 1 of 5 Visit Report for 07/19/2023 Arrival Information Details Patient Name: Date of Service: [name] 07/19/2023 2:15 PM Medical Record Number: [id number] Patient Account Number: [id number] Date of Birth/Sex: Treating RN: 10-15-1961 (62 y.o. F) Primary Care Hanley Woerner: [name] Other Clinician: Referring Hatsue Sime: Treating Henson Fraticelli/Extender: [name], [name] Weeks in Treatment: 13 Visit Information History Since Last Visit Added or deleted any medications: No Patient Arrived: Walker Any new allergies or adverse reactions: No Arrival Time: 14:56 Had a fall or experienced change in No Accompanied By: self activities of daily living that may affect Transfer Assistance: None risk of falls: Patient Identification Verified: Yes Signs or symptoms of abuse/neglect since last visito No Secondary Verification Process Completed: Yes Hospitalized since last visit: No Patient Requires Transmission-Based Precautions: No Implantable device outside of the clinic excluding No Patient Has Alerts: Yes cellular tissue based products placed in the center Patient Alerts: ABI R 0.57 (01/11/23) since last visit: ABI L 1.04 (01/11/23) Has Dressing in Place as Prescribed: Yes Pain Present Now: Yes Electronic Signature(s) Signed: 07/26/2023 4:44:01 PM By: Thayer Dallas Entered By: Thayer Dallas on 07/19/2023 14:59:49 -------------------------------------------------------------------------------- Encounter Discharge Information Details Patient Name: Date of Service: [name], [name] 07/19/2023 2:15 PM Medical Record Number: [id number] Patient Account Number: [id number] Date of Birth/Sex: Treating RN: 03-Dec-1960 (61 y.o. [name] Primary Care Tranae Laramie: [name] Other Clinician: Referring Anber Mckiver: Treating Lorinda Copland/Extender: Juleen China Weeks in Treatment: 13 Encounter  Discharge Information Items Post Procedure Vitals Discharge Condition: Stable Temperature (F): 98.9 Ambulatory Status: Walker Pulse (bpm): 69 Discharge Destination: Home Respiratory Rate (breaths/min): 18 Transportation: Private Auto Blood Pressure (mmHg): 134/82 Accompanied By: self Schedule Follow-up Appointment: Yes Clinical Summary of Care: Electronic Signature(s) Signed: 07/19/2023 6:09:26 PM By: Shawn Stall RN, BSN Entered By: Shawn Stall on 07/19/2023 15:33:22 [name] ([id number]) [id number] Page 2 of 5 -------------------------------------------------------------------------------- Lower Extremity Assessment Details Patient Name: Date of Service: [name] 07/19/2023 2:15 PM Medical Record Number: [id number] Patient Account Number: [id number] Date of Birth/Sex: Treating RN: Aug 15, 1961 (62 y.o. F) Primary Care Greenley Martone: [name] Other Clinician: Referring Miana Politte: Treating Maisen Schmit/Extender: [name], [name] Weeks in Treatment: 13 Edema Assessment Assessed: [Left: No] [Right: No] Edema: [Left: Ye] [Right: s] Calf Left: Right: Point of Measurement: 30 cm From Medial Instep 41 cm Ankle Left: Right: Point of Measurement: 9 cm From Medial Instep 23.2 cm Vascular Assessment Extremity colors, hair growth, and conditions: Extremity Color: [Right:Hyperpigmented] Hair Growth on Extremity: [Right:No] Temperature of Extremity: [Right:Warm] Capillary Refill: [Right:< 3 seconds] Dependent Rubor: [Right:No Yes] Electronic Signature(s) Signed: 07/26/2023 4:44:01 PM By: Thayer Dallas Entered By: Thayer Dallas on 07/19/2023 15:04:38 -------------------------------------------------------------------------------- Multi-Disciplinary Care Plan Details Patient Name: Date of Service: [name], [name] 07/19/2023 2:15 PM Medical Record Number: [id number] Patient Account Number: [id number] Date of Birth/Sex: Treating  RN: 05/18/1961 (61 y.o. [name] Primary Care Roberta Kelly: [name] Other Clinician: Referring Alayziah Tangeman: Treating Chanice Brenton/Extender: [name], [name] Weeks in Treatment: 13 Active Inactive Wound/Skin Impairment Nursing Diagnoses: Impaired tissue integrity Goals: Patient/caregiver will verbalize understanding of skin care regimen Date Initiated: 04/19/2023 Target Resolution Date: 10/19/2023 Goal Status: Active Interventions: AMADA, MONTE ([id number]) [id number] Page 3 of 5 Assess ulceration(s) every visit Treatment Activities: Skin care regimen initiated : 04/19/2023 Notes: Electronic Signature(s) Signed: 07/19/2023 6:09:26 PM By: Shawn Stall RN, BSN Entered By: Shawn Stall on 07/19/2023 15:16:27 -------------------------------------------------------------------------------- Pain Assessment  Details Patient Name: Date of Service: [name] 07/19/2023 2:15 PM Medical Record Number: [id number] Patient Account Number: [id number] Date of Birth/Sex: Treating RN: 06/26/1961 (62 y.o. F) Primary Care Caileigh Canche: [name] Other Clinician: Referring Snow Peoples: Treating Kairie Vangieson/Extender: [name], [name] Weeks in Treatment: 13 Active Problems Location of Pain Severity and Description of Pain Patient Has Paino Yes Site Locations Pain Location: Generalized Pain, Pain in Ulcers Rate the pain. Current Pain Level: 10 Pain Management and Medication Current Pain Management: Electronic Signature(s) Signed: 07/26/2023 4:44:01 PM By: Thayer Dallas Entered By: Thayer Dallas on 07/19/2023 15:00:42 -------------------------------------------------------------------------------- Patient/Caregiver Education Details Patient Name: Date of Service: [name] 9/4/2024andnbsp2:15 PM Medical Record Number: [id number] Patient Account Number: [id number] Date of Birth/Gender: Treating RN: 01-May-1961 (62 y.o. [name] Primary Care Physician: [name] Other Clinician: Referring Physician: Treating Physician/Extender: [name], [name] Weeks in Treatment: [address], [place] ([id number]) [id number] Page 4 of 5 Education Assessment Education Provided To: Patient Education Topics Provided Wound/Skin Impairment: Handouts: Caring for Your Ulcer Methods: Explain/Verbal Responses: Reinforcements needed Electronic Signature(s) Signed: 07/19/2023 6:09:26 PM By: Shawn Stall RN, BSN Entered By: Shawn Stall on 07/19/2023 15:16:38 -------------------------------------------------------------------------------- Wound Assessment Details Patient Name: Date of Service: [name], [name] 07/19/2023 2:15 PM Medical Record Number: [id number] Patient Account Number: [id number] Date of Birth/Sex: Treating RN: 1961-07-31 (61 y.o. F) Primary Care Airiana Elman: [name] Other Clinician: Referring Earle Burson: Treating Cheyanna Strick/Extender: [name], [name] Weeks in Treatment: 13 Wound Status Wound Number: [id number] Primary Diabetic Wound/Ulcer of the Lower Extremity Etiology: Wound Location: Right Metatarsal head first Wound Open Wounding Event: Gradually Appeared Status: Date Acquired: 04/18/2021 Comorbid Chronic Obstructive Pulmonary Disease (COPD), Sleep Apnea, Weeks Of Treatment: 13 History: Arrhythmia, Congestive Heart Failure, Coronary Artery Disease, Clustered Wound: No Hypertension, Peripheral Arterial Disease, Cirrhosis , Type II Diabetes, Osteoarthritis, Neuropathy Photos Wound Measurements Length: (cm) 0.6 Width: (cm) 0.5 Depth: (cm) 0.3 Area: (cm) 0.236 Volume: (cm) 0.071 % Reduction in Area: 74.9% % Reduction in Volume: 84.9% Epithelialization: Small (1-33%) Tunneling: No Undermining: No Wound Description Classification: Grade 2 Wound Margin: Distinct, outline attached Exudate Amount: Medium Exudate Type:  Serosanguineous Exudate Color: red, brown Leveque, Chayah S ([id number]) Wound Bed Granulation Amount: Large (67-100% Granulation Quality: Red Necrotic Amount: Small (1-33%) Necrotic Quality: Adherent Sloug Foul Odor After Cleansing: No Slough/Fibrino Yes [phone].pdf Page 5 of 5 ) Exposed Structure Fascia Exposed: No Fat Layer (Subcutaneous Tissue) Exposed: Yes h Tendon Exposed: No Muscle Exposed: No Joint Exposed: No Bone Exposed: No Periwound Skin Texture Texture Color No Abnormalities Noted: No No Abnormalities Noted: Yes Callus: Yes Temperature / Pain Temperature: No Abnormality Moisture No Abnormalities Noted: No Maceration: No Electronic Signature(s) Signed: 07/26/2023 4:44:01 PM By: Thayer Dallas Entered By: Thayer Dallas on 07/19/2023 15:12:09 -------------------------------------------------------------------------------- Vitals Details Patient Name: Date of Service: [name], [name] 07/19/2023 2:15 PM Medical Record Number: [id number] Patient Account Number: [id number] Date of Birth/Sex: Treating RN: [birth year] (61 y.o. F) Primary Care Bayle Calvo: [name] Other Clinician: Referring Davione Lenker: Treating Jary Louvier/Extender: [name], [name] Weeks in Treatment: 13 Vital Signs Time Taken: 15:09 Temperature (F): 98.9 Height (in): 64 Pulse (bpm): 69 Weight (lbs): 225 Respiratory Rate (breaths/min): 18 Body Mass Index (BMI): 38.6 Blood Pressure (mmHg): 134/82 Reference Range: 80 - 120 mg / dl Electronic Signature(s) Signed: 07/26/2023 4:44:01 PM By: Thayer Dallas Entered By: Thayer Dallas on 07/19/2023 15:10:08

## 2023-07-26 NOTE — Progress Notes (Signed)
ALEIAH, BELFIELD ([id number]) [id number] Page 1 of 6 Visit Report for 07/26/2023 Arrival Information Details Patient Name: Date of Service: [name] 07/26/2023 10:15 A M Medical Record Number: [id number] Patient Account Number: [id number] Date of Birth/Sex: Treating RN: 08/30/1961 (62 y.o. Roselee Nova, Jamie Primary Care Taura Lamarre: [name] Other Clinician: Referring Lanika Colgate: Treating Syann Cupples/Extender: Juleen China Weeks in Treatment: 14 Visit Information History Since Last Visit Added or deleted any medications: No Patient Arrived: Wheel Chair Any new allergies or adverse reactions: No Arrival Time: 10:51 Had a fall or experienced change in No Accompanied By: self activities of daily living that may affect Transfer Assistance: None risk of falls: Patient Requires Transmission-Based Precautions: No Signs or symptoms of abuse/neglect since last visito No Patient Has Alerts: Yes Hospitalized since last visit: No Patient Alerts: ABI R 0.57 (01/11/23) Implantable device outside of the clinic excluding No ABI L 1.04 (01/11/23) cellular tissue based products placed in the center since last visit: Has Dressing in Place as Prescribed: Yes Pain Present Now: Yes Electronic Signature(s) Signed: 07/26/2023 2:47:21 PM By: Tommie Ard RN Entered By: Tommie Ard on 07/26/2023 10:52:17 -------------------------------------------------------------------------------- Encounter Discharge Information Details Patient Name: Date of Service: [name], [name] 07/26/2023 10:15 A M Medical Record Number: [id number] Patient Account Number: [id number] Date of Birth/Sex: Treating RN: Jan 19, 1961 (61 y.o. [name] Primary Care Cassity Christian: [name] Other Clinician: Referring Bowen Kia: Treating Ravonda Brecheen/Extender: Juleen China Weeks in Treatment: 14 Encounter Discharge Information Items Post Procedure  Vitals Discharge Condition: Stable Temperature (F): 98.5 Ambulatory Status: Walker Pulse (bpm): 80 Discharge Destination: Home Respiratory Rate (breaths/min): 18 Transportation: Private Auto Blood Pressure (mmHg): 174/69 Accompanied By: self Schedule Follow-up Appointment: Yes Clinical Summary of Care: Patient Declined Electronic Signature(s) Signed: 07/26/2023 2:26:34 PM By: Redmond Pulling RN, BSN Entered By: Redmond Pulling on 07/26/2023 12:03:35 [name] ([id number]) [id number] Page 2 of 6 -------------------------------------------------------------------------------- Lower Extremity Assessment Details Patient Name: Date of Service: [name] 07/26/2023 10:15 A M Medical Record Number: [id number] Patient Account Number: [id number] Date of Birth/Sex: Treating RN: 06/12/1961 (62 y.o. Kateri Mc Primary Care Quamel Fitzmaurice: [name] Other Clinician: Referring Akeisha Lagerquist: Treating Katharyn Schauer/Extender: [name], [name] Weeks in Treatment: 14 Edema Assessment Assessed: [Left: No] [Right: No] Edema: [Left: Ye] [Right: s] Calf Left: Right: Point of Measurement: 30 cm From Medial Instep 39.8 cm Ankle Left: Right: Point of Measurement: 9 cm From Medial Instep 23 cm Vascular Assessment Pulses: Dorsalis Pedis Palpable: [Right:Yes] Extremity colors, hair growth, and conditions: Extremity Color: [Right:Hyperpigmented] Hair Growth on Extremity: [Right:No] Temperature of Extremity: [Right:Warm] Capillary Refill: [Right:< 3 seconds] Dependent Rubor: [Right:No Yes] Electronic Signature(s) Signed: 07/26/2023 2:47:21 PM By: Tommie Ard RN Entered By: Tommie Ard on 07/26/2023 10:55:50 -------------------------------------------------------------------------------- Multi-Disciplinary Care Plan Details Patient Name: Date of Service: [name], [name] 07/26/2023 10:15 A M Medical Record Number: [id number] Patient Account  Number: [id number] Date of Birth/Sex: Treating RN: 05/13/1961 (61 y.o. [name] Primary Care Analaura Messler: [name] Other Clinician: Referring Errika Narvaiz: Treating Rosetta Rupnow/Extender: [name], [name] Weeks in Treatment: 14 Active Inactive Wound/Skin Impairment Nursing Diagnoses: Impaired tissue integrity Goals: Patient/caregiver will verbalize understanding of skin care regimen Date Initiated: 04/19/2023 Target Resolution Date: 10/19/2023 NIKE, MONTUFAR ([id number]) [id number] Page 3 of 6 Goal Status: Active Interventions: Assess ulceration(s) every visit Treatment Activities: Skin care regimen initiated : 04/19/2023 Notes: Electronic Signature(s) Signed: 07/26/2023 2:26:34 PM By: Redmond Pulling RN, BSN Entered  By: Redmond Pulling on 07/26/2023 11:31:22 -------------------------------------------------------------------------------- Pain Assessment Details Patient Name: Date of Service: [name] 07/26/2023 10:15 A M Medical Record Number: [id number] Patient Account Number: [id number] Date of Birth/Sex: Treating RN: [birth year] (63 y.o. Kateri Mc Primary Care Damoni Erker: [name] Other Clinician: Referring Chimere Klingensmith: Treating Anup Brigham/Extender: Juleen China Weeks in Treatment: 14 Active Problems Location of Pain Severity and Description of Pain Patient Has Paino Yes Site Locations Pain Location: Generalized Pain, Pain in Ulcers Rate the pain. Current Pain Level: 8 Character of Pain Describe the Pain: Aching Pain Management and Medication Current Pain Management: Electronic Signature(s) Signed: 07/26/2023 2:47:21 PM By: Tommie Ard RN Entered By: Tommie Ard on 07/26/2023 10:54:22 Patient/Caregiver Education Details -------------------------------------------------------------------------------- [name] ([id number]) [id number] Page 4 of 6 Patient  Name: Date of Service: [name] 9/11/2024andnbsp10:15 A M Medical Record Number: [id number] Patient Account Number: [id number] Date of Birth/Gender: Treating RN: [birth year] (62 y.o. [name] Primary Care Physician: [name] Other Clinician: Referring Physician: Treating Physician/Extender: Juleen China Weeks in Treatment: 14 Education Assessment Education Provided To: Patient Education Topics Provided Wound/Skin Impairment: Methods: Explain/Verbal Responses: State content correctly [employer]) Signed: 07/26/2023 2:26:34 PM By: Redmond Pulling RN, BSN Entered By: Redmond Pulling on 07/26/2023 11:31:57 -------------------------------------------------------------------------------- Wound Assessment Details Patient Name: Date of Service: [name], [name] 07/26/2023 10:15 A M Medical Record Number: [id number] Patient Account Number: [id number] Date of Birth/Sex: Treating RN: 1961-04-24 (61 y.o. Roselee Nova, Jamie Primary Care Neamiah Sciarra: [name] Other Clinician: Referring Jadarion Halbig: Treating Mostyn Varnell/Extender: [name], [name] Weeks in Treatment: 14 Wound Status Wound Number: 23 Primary Diabetic Wound/Ulcer of the Lower Extremity Etiology: Wound Location: Right Metatarsal head first Wound Open Wounding Event: Gradually Appeared Status: Date Acquired: 04/18/2021 Comorbid Chronic Obstructive Pulmonary Disease (COPD), Sleep Apnea, Weeks Of Treatment: 14 History: Arrhythmia, Congestive Heart Failure, Coronary Artery Disease, Clustered Wound: No Hypertension, Peripheral Arterial Disease, Cirrhosis , Type II Diabetes, Osteoarthritis, Neuropathy Photos Wound Measurements Length: (cm) 0.9 Width: (cm) 0.6 Depth: (cm) 0.3 Area: (cm) 0.424 Volume: (cm) 0.127 % Reduction in Area: 55% % Reduction in Volume: 73% Epithelialization: Small (1-33%) Tunneling: No Undermining: No Wound Description Classification:  Grade 2 Lawhead, Shanise S ([id number]) Wound Margin: Distinct, outline attached Exudate Amount: Medium Exudate Type: Serosanguineous Exudate Color: red, brown Foul Odor After Cleansing: No [id number] Page 5 of 6 Slough/Fibrino Yes Wound Bed Granulation Amount: Large (67-100%) Exposed Structure Granulation Quality: Red Fascia Exposed: No Necrotic Amount: Small (1-33%) Fat Layer (Subcutaneous Tissue) Exposed: Yes Necrotic Quality: Adherent Slough Tendon Exposed: No Muscle Exposed: No Joint Exposed: No Bone Exposed: No Periwound Skin Texture Texture Color No Abnormalities Noted: No No Abnormalities Noted: Yes Callus: Yes Temperature / Pain Temperature: No Abnormality Moisture No Abnormalities Noted: No Dry / Scaly: No Maceration: Yes Treatment Notes Wound #23 (Metatarsal head first) Wound Laterality: Right Cleanser Soap and Water Discharge Instruction: May shower and wash wound with dial antibacterial soap and water prior to dressing change. Wound Cleanser Discharge Instruction: Cleanse the wound with wound cleanser prior to applying a clean dressing using gauze sponges, not tissue or cotton balls. Peri-Wound Care Sween Lotion (Moisturizing lotion) Discharge Instruction: Apply moisturizing lotion as directed Topical Primary Dressing Promogran Prisma Matrix, 4.34 (sq in) (silver collagen) Discharge Instruction: Moisten collagen with saline or hydrogel Secondary Dressing Optifoam Non-Adhesive Dressing, 4x4 in Discharge Instruction: Apply foam donut and gauze Secured With Compression Wrap Kerlix Roll 4.5x3.1 (in/yd) Discharge Instruction:  Apply Kerlix and Coban compression as directed. Coban Self-Adherent Wrap 4x5 (in/yd) Discharge Instruction: Apply over Kerlix as directed. Compression Stockings Add-Ons Electronic Signature(s) Signed: 07/26/2023 2:47:21 PM By: Tommie Ard RN Entered By: Tommie Ard on 07/26/2023 11:00:48 Vitals  Details -------------------------------------------------------------------------------- [name] ([id number]) [id number] Page 6 of 6 Patient Name: Date of Service: [name] 07/26/2023 10:15 A M Medical Record Number: [id number] Patient Account Number: [id number] Date of Birth/Sex: Treating RN: Oct 13, 1961 (62 y.o. Roselee Nova, Jamie Primary Care Jazzmin Newbold: [name] Other Clinician: Referring Adriell Polansky: Treating Sallee Hogrefe/Extender: [name], [name] Weeks in Treatment: 14 Vital Signs Time Taken: 10:52 Temperature (F): 98.5 Height (in): 64 Pulse (bpm): 80 Weight (lbs): 225 Respiratory Rate (breaths/min): 18 Body Mass Index (BMI): 38.6 Blood Pressure (mmHg): 174/69 Reference Range: 80 - 120 mg / dl Electronic Signature(s) Signed: 07/26/2023 2:47:21 PM By: Tommie Ard RN Entered By: Tommie Ard on 07/26/2023 10:53:28

## 2023-07-26 NOTE — Progress Notes (Addendum)
No Yes lower extremity Inactive Problems Resolved Problems Electronic Signature(s) Signed: 07/26/2023 10:49:19 AM By: Allen Derry PA-C Entered By: Allen Derry on 07/26/2023 10:49:18 -------------------------------------------------------------------------------- Progress Note Details Patient Name: Date of Service: Karla LEY, Karla [name] 07/26/2023 10:15 A M Medical Record  Number: [id number] Patient Account Number: [id number] Date of Birth/Sex: Treating RN: 11/02/1961 (62 y.o. F) Primary Care Provider: [name] Other Clinician: Referring Provider: Treating Provider/Extender: [name], [name] Weeks in Treatment: 14 Subjective Chief Complaint Information obtained from Patient Bilateral LE Ulcers History of Present Illness (HPI) This 62 year old patient who has a very long significant history of diabetes mellitus, previous alcohol and nicotine abuse, chronic data disease, COPD, diabetes mellitus, height hypertension, critical lower limb ischemia with several wounds being managed at the wound Center at [facility] for over a year. She was recently in the ER at [facility] and was referred to our center. He has had a long history of critical limb ischemia and over a period of time has had balloon angioplasties in March 2016, endarterectomy of the left carotid, lower extremity angiogram and treatment by Dr. Nanetta Batty, several cardiac catheterizations. Most recently she had an x-ray of her left foot while in the ER which showed no acute abnormality. during this ER visit she was started on ciprofloxacin and asked to continue with the wound care physicians at [facility]. Her last ABI done in June 2017 showed the right side to be 0.28 in the left side to be 0.48. Her right toe brachial index was 0.17 on the right and 0.27 on the left. her last hemoglobin A1c was 11.1% She continues to smoke about a pack of cigarettes a day. 03/28/2017 -- -- right foot x-ray -- IMPRESSION:Areas of soft tissue swelling. Mild subluxation second PIP joint. No frank dislocation or fracture. No erosive change or bony destruction. No soft tissue ulceration or radiopaque foreign body evident by radiography. There is plantar fascia calcification with a nearby inferior calcaneal spur. There are foci of arterial vascular calcification. 04/04/2017 -- the patient  continues to be noncompliant and continues to complain of a lot of pain and has not done anything about quitting smoking Readmission: 06/26/18 on evaluation today patient presents for reevaluation she has not been seen in our office since May 2018. Since she was last seen here in our office she has undergone testing at Dr. Durenda Age office where it was revealed that she had an ABI of 0.68 with her previous ABI being 0.64 and a left ABI of 0.38 with previous ABI being 0.34 this study which was performed on 04/05/18 was pretty much equivalent to the study performed on 11/22/17. The findings in the end revealed on the right would appear to be moderate right lower extremity arterial disease on the left Dr. Allyson Sabal states moderate in the report but unfortunately this appears to be much more severe at 0.38 compared to the right. Subsequently the patient was scheduled to have angiography with Dr. Allyson Sabal on 04/12/18. This was however canceled due to the fact that the patient was found to have chronic kidney disease stage III and it was to the point that he did not feel that it will be safe to pursue angiography at that point. She has not been on any antibiotics recently. At this point Dr. Allyson Sabal has not rescheduled anything as far as the injured [place] according to the patient he is somewhat reluctant to Karla so. Nonetheless with her diminished blood flow this is gonna make  Protect dressing(s) with water repellant cover (for example, large plastic bag) or a cast cover and may then take shower. Edema Control - Lymphedema / SCD / Other: Elevate legs to the level of the heart or above for 30 minutes daily and/or when sitting for 3-4 times a day throughout the day. Avoid standing for long periods of time. Patient to wear own compression stockings every day. Exercise regularly - As tolerated Moisturize legs daily. Other Edema Control Orders/Instructions: - Recommend Compression stocking 15-68mm/Hg. Brochure for Elastic Therapy with printed leg measurements. WOUND #23: - Metatarsal head first Wound Laterality: Right Cleanser: Soap and Water 1 x Per Week/30 Days Discharge Instructions: May shower and wash wound with dial antibacterial soap and water prior to dressing change. Cleanser: Wound Cleanser 1 x Per Week/30 Days Discharge Instructions: Cleanse the wound with wound cleanser prior to applying a clean dressing using gauze sponges, not tissue or cotton balls. Peri-Wound Care: Sween Lotion (Moisturizing lotion) 1 x Per Week/30 Days Discharge Instructions: Apply moisturizing lotion as directed Prim Dressing: Promogran Prisma Matrix, 4.34 (sq in) (silver collagen) 1 x Per Week/30 Days ary Discharge Instructions: Moisten collagen with saline or hydrogel Secondary Dressing: Optifoam Non-Adhesive Dressing, 4x4 in 1 x Per Week/30 Days Discharge Instructions: Apply foam donut and gauze Com pression Wrap: Kerlix Roll 4.5x3.1 (in/yd) 1 x Per Week/30 Days Discharge Instructions:  Apply Kerlix and Coban compression as directed. Com pression Wrap: Coban Self-Adherent Wrap 4x5 (in/yd) 1 x Per Week/30 Days Discharge Instructions: Apply over Kerlix as directed. 1. I would recommend that we have the patient continue to monitor for any evidence of infection or worsening. Overall based on biopsy and I Karla believe that we will making good headway towards closure. 2. Also can recommend the patient should continue to monitor for any signs of infection. Overall I Karla not see any evidence of infection at the moment. 3. She does have her appointment for the angiogram on Friday with vascular. Following this am hopeful she will have even better blood flow that we will be able to actually see some improvement going forward as far as getting this area to heal is concerned. We will see patient back for reevaluation in 1 week here in the clinic. If anything worsens or changes patient will contact our office for additional recommendations. Electronic Signature(s) Signed: 07/28/2023 11:40:57 AM By: Shawn Stall RN, BSN Signed: 07/28/2023 1:20:16 PM By: Allen Derry PA-C Previous Signature: 07/26/2023 11:35:36 AM Version By: Allen Derry PA-C Entered By: Shawn Stall on 07/28/2023 11:35:57 -------------------------------------------------------------------------------- SuperBill Details Patient Name: Date of Service: [name], Karla [name] 07/26/2023 Medical Record Number: [id number] Patient Account Number: [id number] Date of Birth/Sex: Treating RN: [birth year] (61 y.o. 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 L97.522 Non-pressure chronic ulcer of other part of left foot with fat layer  exposed E11.622 Type 2 diabetes mellitus with other skin ulcer L97.812 Non-pressure chronic ulcer of other part of right lower leg with fat layer exposed I73.89 Other specified peripheral vascular diseases MAYLI, HANA ([id number]) [id number] Page 12 of 12 I87.331 Chronic venous hypertension (idiopathic) with ulcer and inflammation of right lower extremity Facility Procedures : CPT4 Code: 36644034 Description: 97597 - DEBRIDE WOUND 1ST 20 SQ CM OR < ICD-10 Diagnosis Description L97.512 Non-pressure  had progression versus a previous study. She had some stenosis in the SFA a 7599% stenosis in the proximal mid SFA mild stenosis in the distal SFA and mild stenosis in the proximal PTA. On the left she had a patent left femoral to ATA bypass graft with mild stenosis It was recommended that she undergo angiography for limb salvage on the right. That is being arranged as we speak. Her wound is on the right first metatarsal head she has been using collagen as the primary dressing. She tells me she is making every effort to stop smoking 07-19-2023 upon evaluation today patient appears to be doing okay in regard to her foot ulcer although her arterial  procedure/revascularization keeps getting pushed back. She states that they are talking about pushing back and left nail due to the fact that she changed insurances and I Karla not have a being "updated insurance card number". With that being said I explained to the patient that should be something that she should be able to get on line easily by making an account. She is also called [place] [facility] and they said they were sending it but it would be about a week before described by next Wednesday. She really does not have time to be holding on this she needs to be proceeding with intervention as quickly as possible. 07-26-2023 upon evaluation today patient appears to be doing well currently in regard to her wound. This is actually showing signs of looking about the same I did clear a lot of callus up last week this actually looks better to me from an overall visual standpoint than last week though it is a little larger because of the callus I had removed. With that being said I feel like she is actually making really good progress here towards complete closure. No fevers, chills, nausea, vomiting, or diarrhea. Electronic Signature(s) Signed: 07/26/2023 11:34:33 AM By: Allen Derry PA-C Entered By: Allen Derry on 07/26/2023 11:34:33 [name] ([id number]) [id number] Page 5 of 12 -------------------------------------------------------------------------------- Physical Exam Details Patient Name: Date of Service: [name]. 07/26/2023 10:15 A M Medical Record Number: [id number] Patient Account Number: [id number] Date of Birth/Sex: Treating RN: June 08, 1961 (62 y.o. F) Primary Care Provider: [name] Other Clinician: Referring Provider: Treating Provider/Extender: [name], [name] Weeks in Treatment: 14 Constitutional Obese and well-hydrated in no acute distress. Respiratory normal breathing without difficulty. Psychiatric this patient is  able to make decisions and demonstrates good insight into disease process. Alert and Oriented x 3. pleasant and cooperative. Notes Upon inspection patient's wound bed actually showed signs of good granulation epithelization at this point. Fortunately I Karla not see any evidence of worsening overall and I Karla believe that the patient is making excellent headway towards complete closure which is great news. She does seem to be tolerating the dressing changes without complication and I am very happy in that regard as well. Electronic Signature(s) Signed: 07/26/2023 11:34:58 AM By: Allen Derry PA-C Entered By: Allen Derry on 07/26/2023 11:34:57 -------------------------------------------------------------------------------- Physician Orders Details Patient Name: Date of Service: [name], Karla [name] 07/26/2023 10:15 A M Medical Record Number: [id number] Patient Account Number: [id number] Date of Birth/Sex: Treating RN: 1961/09/16 (61 y.o. 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 07/26/2023 10:15 A M Medical Record  Number: [id number] Patient Account Number: [id number] Date of Birth/Sex: Treating RN: 11/02/1961 (62 y.o. F) Primary Care Provider: [name] Other Clinician: Referring Provider: Treating Provider/Extender: [name], [name] Weeks in Treatment: 14 Subjective Chief Complaint Information obtained from Patient Bilateral LE Ulcers History of Present Illness (HPI) This 62 year old patient who has a very long significant history of diabetes mellitus, previous alcohol and nicotine abuse, chronic data disease, COPD, diabetes mellitus, height hypertension, critical lower limb ischemia with several wounds being managed at the wound Center at [facility] for over a year. She was recently in the ER at [facility] and was referred to our center. He has had a long history of critical limb ischemia and over a period of time has had balloon angioplasties in March 2016, endarterectomy of the left carotid, lower extremity angiogram and treatment by Dr. Nanetta Batty, several cardiac catheterizations. Most recently she had an x-ray of her left foot while in the ER which showed no acute abnormality. during this ER visit she was started on ciprofloxacin and asked to continue with the wound care physicians at [facility]. Her last ABI done in June 2017 showed the right side to be 0.28 in the left side to be 0.48. Her right toe brachial index was 0.17 on the right and 0.27 on the left. her last hemoglobin A1c was 11.1% She continues to smoke about a pack of cigarettes a day. 03/28/2017 -- -- right foot x-ray -- IMPRESSION:Areas of soft tissue swelling. Mild subluxation second PIP joint. No frank dislocation or fracture. No erosive change or bony destruction. No soft tissue ulceration or radiopaque foreign body evident by radiography. There is plantar fascia calcification with a nearby inferior calcaneal spur. There are foci of arterial vascular calcification. 04/04/2017 -- the patient  continues to be noncompliant and continues to complain of a lot of pain and has not done anything about quitting smoking Readmission: 06/26/18 on evaluation today patient presents for reevaluation she has not been seen in our office since May 2018. Since she was last seen here in our office she has undergone testing at Dr. Durenda Age office where it was revealed that she had an ABI of 0.68 with her previous ABI being 0.64 and a left ABI of 0.38 with previous ABI being 0.34 this study which was performed on 04/05/18 was pretty much equivalent to the study performed on 11/22/17. The findings in the end revealed on the right would appear to be moderate right lower extremity arterial disease on the left Dr. Allyson Sabal states moderate in the report but unfortunately this appears to be much more severe at 0.38 compared to the right. Subsequently the patient was scheduled to have angiography with Dr. Allyson Sabal on 04/12/18. This was however canceled due to the fact that the patient was found to have chronic kidney disease stage III and it was to the point that he did not feel that it will be safe to pursue angiography at that point. She has not been on any antibiotics recently. At this point Dr. Allyson Sabal has not rescheduled anything as far as the injured [place] according to the patient he is somewhat reluctant to Karla so. Nonetheless with her diminished blood flow this is gonna make  Protect dressing(s) with water repellant cover (for example, large plastic bag) or a cast cover and may then take shower. Edema Control - Lymphedema / SCD / Other: Elevate legs to the level of the heart or above for 30 minutes daily and/or when sitting for 3-4 times a day throughout the day. Avoid standing for long periods of time. Patient to wear own compression stockings every day. Exercise regularly - As tolerated Moisturize legs daily. Other Edema Control Orders/Instructions: - Recommend Compression stocking 15-68mm/Hg. Brochure for Elastic Therapy with printed leg measurements. WOUND #23: - Metatarsal head first Wound Laterality: Right Cleanser: Soap and Water 1 x Per Week/30 Days Discharge Instructions: May shower and wash wound with dial antibacterial soap and water prior to dressing change. Cleanser: Wound Cleanser 1 x Per Week/30 Days Discharge Instructions: Cleanse the wound with wound cleanser prior to applying a clean dressing using gauze sponges, not tissue or cotton balls. Peri-Wound Care: Sween Lotion (Moisturizing lotion) 1 x Per Week/30 Days Discharge Instructions: Apply moisturizing lotion as directed Prim Dressing: Promogran Prisma Matrix, 4.34 (sq in) (silver collagen) 1 x Per Week/30 Days ary Discharge Instructions: Moisten collagen with saline or hydrogel Secondary Dressing: Optifoam Non-Adhesive Dressing, 4x4 in 1 x Per Week/30 Days Discharge Instructions: Apply foam donut and gauze Com pression Wrap: Kerlix Roll 4.5x3.1 (in/yd) 1 x Per Week/30 Days Discharge Instructions:  Apply Kerlix and Coban compression as directed. Com pression Wrap: Coban Self-Adherent Wrap 4x5 (in/yd) 1 x Per Week/30 Days Discharge Instructions: Apply over Kerlix as directed. 1. I would recommend that we have the patient continue to monitor for any evidence of infection or worsening. Overall based on biopsy and I Karla believe that we will making good headway towards closure. 2. Also can recommend the patient should continue to monitor for any signs of infection. Overall I Karla not see any evidence of infection at the moment. 3. She does have her appointment for the angiogram on Friday with vascular. Following this am hopeful she will have even better blood flow that we will be able to actually see some improvement going forward as far as getting this area to heal is concerned. We will see patient back for reevaluation in 1 week here in the clinic. If anything worsens or changes patient will contact our office for additional recommendations. Electronic Signature(s) Signed: 07/28/2023 11:40:57 AM By: Shawn Stall RN, BSN Signed: 07/28/2023 1:20:16 PM By: Allen Derry PA-C Previous Signature: 07/26/2023 11:35:36 AM Version By: Allen Derry PA-C Entered By: Shawn Stall on 07/28/2023 11:35:57 -------------------------------------------------------------------------------- SuperBill Details Patient Name: Date of Service: [name], Karla [name] 07/26/2023 Medical Record Number: [id number] Patient Account Number: [id number] Date of Birth/Sex: Treating RN: [birth year] (61 y.o. 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 L97.522 Non-pressure chronic ulcer of other part of left foot with fat layer  exposed E11.622 Type 2 diabetes mellitus with other skin ulcer L97.812 Non-pressure chronic ulcer of other part of right lower leg with fat layer exposed I73.89 Other specified peripheral vascular diseases MAYLI, HANA ([id number]) [id number] Page 12 of 12 I87.331 Chronic venous hypertension (idiopathic) with ulcer and inflammation of right lower extremity Facility Procedures : CPT4 Code: 36644034 Description: 97597 - DEBRIDE WOUND 1ST 20 SQ CM OR < ICD-10 Diagnosis Description L97.512 Non-pressure  Protect dressing(s) with water repellant cover (for example, large plastic bag) or a cast cover and may then take shower. Edema Control - Lymphedema / SCD / Other: Elevate legs to the level of the heart or above for 30 minutes daily and/or when sitting for 3-4 times a day throughout the day. Avoid standing for long periods of time. Patient to wear own compression stockings every day. Exercise regularly - As tolerated Moisturize legs daily. Other Edema Control Orders/Instructions: - Recommend Compression stocking 15-68mm/Hg. Brochure for Elastic Therapy with printed leg measurements. WOUND #23: - Metatarsal head first Wound Laterality: Right Cleanser: Soap and Water 1 x Per Week/30 Days Discharge Instructions: May shower and wash wound with dial antibacterial soap and water prior to dressing change. Cleanser: Wound Cleanser 1 x Per Week/30 Days Discharge Instructions: Cleanse the wound with wound cleanser prior to applying a clean dressing using gauze sponges, not tissue or cotton balls. Peri-Wound Care: Sween Lotion (Moisturizing lotion) 1 x Per Week/30 Days Discharge Instructions: Apply moisturizing lotion as directed Prim Dressing: Promogran Prisma Matrix, 4.34 (sq in) (silver collagen) 1 x Per Week/30 Days ary Discharge Instructions: Moisten collagen with saline or hydrogel Secondary Dressing: Optifoam Non-Adhesive Dressing, 4x4 in 1 x Per Week/30 Days Discharge Instructions: Apply foam donut and gauze Com pression Wrap: Kerlix Roll 4.5x3.1 (in/yd) 1 x Per Week/30 Days Discharge Instructions:  Apply Kerlix and Coban compression as directed. Com pression Wrap: Coban Self-Adherent Wrap 4x5 (in/yd) 1 x Per Week/30 Days Discharge Instructions: Apply over Kerlix as directed. 1. I would recommend that we have the patient continue to monitor for any evidence of infection or worsening. Overall based on biopsy and I Karla believe that we will making good headway towards closure. 2. Also can recommend the patient should continue to monitor for any signs of infection. Overall I Karla not see any evidence of infection at the moment. 3. She does have her appointment for the angiogram on Friday with vascular. Following this am hopeful she will have even better blood flow that we will be able to actually see some improvement going forward as far as getting this area to heal is concerned. We will see patient back for reevaluation in 1 week here in the clinic. If anything worsens or changes patient will contact our office for additional recommendations. Electronic Signature(s) Signed: 07/28/2023 11:40:57 AM By: Shawn Stall RN, BSN Signed: 07/28/2023 1:20:16 PM By: Allen Derry PA-C Previous Signature: 07/26/2023 11:35:36 AM Version By: Allen Derry PA-C Entered By: Shawn Stall on 07/28/2023 11:35:57 -------------------------------------------------------------------------------- SuperBill Details Patient Name: Date of Service: [name], Karla [name] 07/26/2023 Medical Record Number: [id number] Patient Account Number: [id number] Date of Birth/Sex: Treating RN: [birth year] (61 y.o. 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 L97.522 Non-pressure chronic ulcer of other part of left foot with fat layer  exposed E11.622 Type 2 diabetes mellitus with other skin ulcer L97.812 Non-pressure chronic ulcer of other part of right lower leg with fat layer exposed I73.89 Other specified peripheral vascular diseases MAYLI, HANA ([id number]) [id number] Page 12 of 12 I87.331 Chronic venous hypertension (idiopathic) with ulcer and inflammation of right lower extremity Facility Procedures : CPT4 Code: 36644034 Description: 97597 - DEBRIDE WOUND 1ST 20 SQ CM OR < ICD-10 Diagnosis Description L97.512 Non-pressure  No Yes lower extremity Inactive Problems Resolved Problems Electronic Signature(s) Signed: 07/26/2023 10:49:19 AM By: Allen Derry PA-C Entered By: Allen Derry on 07/26/2023 10:49:18 -------------------------------------------------------------------------------- Progress Note Details Patient Name: Date of Service: Karla LEY, Karla [name] 07/26/2023 10:15 A M Medical Record  Number: [id number] Patient Account Number: [id number] Date of Birth/Sex: Treating RN: 11/02/1961 (62 y.o. F) Primary Care Provider: [name] Other Clinician: Referring Provider: Treating Provider/Extender: [name], [name] Weeks in Treatment: 14 Subjective Chief Complaint Information obtained from Patient Bilateral LE Ulcers History of Present Illness (HPI) This 62 year old patient who has a very long significant history of diabetes mellitus, previous alcohol and nicotine abuse, chronic data disease, COPD, diabetes mellitus, height hypertension, critical lower limb ischemia with several wounds being managed at the wound Center at [facility] for over a year. She was recently in the ER at [facility] and was referred to our center. He has had a long history of critical limb ischemia and over a period of time has had balloon angioplasties in March 2016, endarterectomy of the left carotid, lower extremity angiogram and treatment by Dr. Nanetta Batty, several cardiac catheterizations. Most recently she had an x-ray of her left foot while in the ER which showed no acute abnormality. during this ER visit she was started on ciprofloxacin and asked to continue with the wound care physicians at [facility]. Her last ABI done in June 2017 showed the right side to be 0.28 in the left side to be 0.48. Her right toe brachial index was 0.17 on the right and 0.27 on the left. her last hemoglobin A1c was 11.1% She continues to smoke about a pack of cigarettes a day. 03/28/2017 -- -- right foot x-ray -- IMPRESSION:Areas of soft tissue swelling. Mild subluxation second PIP joint. No frank dislocation or fracture. No erosive change or bony destruction. No soft tissue ulceration or radiopaque foreign body evident by radiography. There is plantar fascia calcification with a nearby inferior calcaneal spur. There are foci of arterial vascular calcification. 04/04/2017 -- the patient  continues to be noncompliant and continues to complain of a lot of pain and has not done anything about quitting smoking Readmission: 06/26/18 on evaluation today patient presents for reevaluation she has not been seen in our office since May 2018. Since she was last seen here in our office she has undergone testing at Dr. Durenda Age office where it was revealed that she had an ABI of 0.68 with her previous ABI being 0.64 and a left ABI of 0.38 with previous ABI being 0.34 this study which was performed on 04/05/18 was pretty much equivalent to the study performed on 11/22/17. The findings in the end revealed on the right would appear to be moderate right lower extremity arterial disease on the left Dr. Allyson Sabal states moderate in the report but unfortunately this appears to be much more severe at 0.38 compared to the right. Subsequently the patient was scheduled to have angiography with Dr. Allyson Sabal on 04/12/18. This was however canceled due to the fact that the patient was found to have chronic kidney disease stage III and it was to the point that he did not feel that it will be safe to pursue angiography at that point. She has not been on any antibiotics recently. At this point Dr. Allyson Sabal has not rescheduled anything as far as the injured [place] according to the patient he is somewhat reluctant to Karla so. Nonetheless with her diminished blood flow this is gonna make  No Yes lower extremity Inactive Problems Resolved Problems Electronic Signature(s) Signed: 07/26/2023 10:49:19 AM By: Allen Derry PA-C Entered By: Allen Derry on 07/26/2023 10:49:18 -------------------------------------------------------------------------------- Progress Note Details Patient Name: Date of Service: Karla LEY, Karla [name] 07/26/2023 10:15 A M Medical Record  Number: [id number] Patient Account Number: [id number] Date of Birth/Sex: Treating RN: 11/02/1961 (62 y.o. F) Primary Care Provider: [name] Other Clinician: Referring Provider: Treating Provider/Extender: [name], [name] Weeks in Treatment: 14 Subjective Chief Complaint Information obtained from Patient Bilateral LE Ulcers History of Present Illness (HPI) This 62 year old patient who has a very long significant history of diabetes mellitus, previous alcohol and nicotine abuse, chronic data disease, COPD, diabetes mellitus, height hypertension, critical lower limb ischemia with several wounds being managed at the wound Center at [facility] for over a year. She was recently in the ER at [facility] and was referred to our center. He has had a long history of critical limb ischemia and over a period of time has had balloon angioplasties in March 2016, endarterectomy of the left carotid, lower extremity angiogram and treatment by Dr. Nanetta Batty, several cardiac catheterizations. Most recently she had an x-ray of her left foot while in the ER which showed no acute abnormality. during this ER visit she was started on ciprofloxacin and asked to continue with the wound care physicians at [facility]. Her last ABI done in June 2017 showed the right side to be 0.28 in the left side to be 0.48. Her right toe brachial index was 0.17 on the right and 0.27 on the left. her last hemoglobin A1c was 11.1% She continues to smoke about a pack of cigarettes a day. 03/28/2017 -- -- right foot x-ray -- IMPRESSION:Areas of soft tissue swelling. Mild subluxation second PIP joint. No frank dislocation or fracture. No erosive change or bony destruction. No soft tissue ulceration or radiopaque foreign body evident by radiography. There is plantar fascia calcification with a nearby inferior calcaneal spur. There are foci of arterial vascular calcification. 04/04/2017 -- the patient  continues to be noncompliant and continues to complain of a lot of pain and has not done anything about quitting smoking Readmission: 06/26/18 on evaluation today patient presents for reevaluation she has not been seen in our office since May 2018. Since she was last seen here in our office she has undergone testing at Dr. Durenda Age office where it was revealed that she had an ABI of 0.68 with her previous ABI being 0.64 and a left ABI of 0.38 with previous ABI being 0.34 this study which was performed on 04/05/18 was pretty much equivalent to the study performed on 11/22/17. The findings in the end revealed on the right would appear to be moderate right lower extremity arterial disease on the left Dr. Allyson Sabal states moderate in the report but unfortunately this appears to be much more severe at 0.38 compared to the right. Subsequently the patient was scheduled to have angiography with Dr. Allyson Sabal on 04/12/18. This was however canceled due to the fact that the patient was found to have chronic kidney disease stage III and it was to the point that he did not feel that it will be safe to pursue angiography at that point. She has not been on any antibiotics recently. At this point Dr. Allyson Sabal has not rescheduled anything as far as the injured [place] according to the patient he is somewhat reluctant to Karla so. Nonetheless with her diminished blood flow this is gonna make  Protect dressing(s) with water repellant cover (for example, large plastic bag) or a cast cover and may then take shower. Edema Control - Lymphedema / SCD / Other: Elevate legs to the level of the heart or above for 30 minutes daily and/or when sitting for 3-4 times a day throughout the day. Avoid standing for long periods of time. Patient to wear own compression stockings every day. Exercise regularly - As tolerated Moisturize legs daily. Other Edema Control Orders/Instructions: - Recommend Compression stocking 15-68mm/Hg. Brochure for El[employer] with printed leg measurements. WOUND #23: - Metatarsal head first Wound Laterality: Right Cleanser: Soap and Water 1 x Per Week/30 Days Discharge Instructions: May shower and wash wound with dial antibacterial soap and water prior to dressing change. Cleanser: Wound Cleanser 1 x Per Week/30 Days Discharge Instructions: Cleanse the wound with wound cleanser prior to applying a clean dressing using gauze sponges, not tissue or cotton balls. Peri-Wound Care: Sween Lotion (Moisturizing lotion) 1 x Per Week/30 Days Discharge Instructions: Apply moisturizing lotion as directed Prim Dressing: Promogran Prisma Matrix, 4.34 (sq in) (silver collagen) 1 x Per Week/30 Days ary Discharge Instructions: Moisten collagen with saline or hydrogel Secondary Dressing: Optifoam Non-Adhesive Dressing, 4x4 in 1 x Per Week/30 Days Discharge Instructions: Apply foam donut and gauze Com pression Wrap: Kerlix Roll 4.5x3.1 (in/yd) 1 x Per Week/30 Days Discharge Instructions:  Apply Kerlix and Coban compression as directed. Com pression Wrap: Coban Self-Adherent Wrap 4x5 (in/yd) 1 x Per Week/30 Days Discharge Instructions: Apply over Kerlix as directed. 1. I would recommend that we have the patient continue to monitor for any evidence of infection or worsening. Overall based on biopsy and I Karla believe that we will making good headway towards closure. 2. Also can recommend the patient should continue to monitor for any signs of infection. Overall I Karla not see any evidence of infection at the moment. 3. She does have her appointment for the angiogram on Friday with vascular. Following this am hopeful she will have even better blood flow that we will be able to actually see some improvement going forward as far as getting this area to heal is concerned. We will see patient back for reevaluation in 1 week here in the clinic. If anything worsens or changes patient will contact our office for additional recommendations. Electronic Signature(s) Signed: 07/28/2023 11:40:57 AM By: Shawn Stall RN, BSN Signed: 07/28/2023 1:20:16 PM By: Allen Derry PA-C Previous Signature: 07/26/2023 11:35:36 AM Version By: Allen Derry PA-C Entered By: Shawn Stall on 07/28/2023 11:35:57 -------------------------------------------------------------------------------- SuperBill Details Patient Name: Date of Service: [name], Karla [name] 07/26/2023 Medical Record Number: [id number] Patient Account Number: [id number] Date of Birth/Sex: Treating RN: [birth year] (61 y.o. 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 L97.522 Non-pressure chronic ulcer of other part of left foot with fat layer  exposed E11.622 Type 2 diabetes mellitus with other skin ulcer L97.812 Non-pressure chronic ulcer of other part of right lower leg with fat layer exposed I73.89 Other specified peripheral vascular diseases MAYLI, HANA ([id number]) [id number] Page 12 of 12 I87.331 Chronic venous hypertension (idiopathic) with ulcer and inflammation of right lower extremity Facility Procedures : CPT4 Code: 36644034 Description: 97597 - DEBRIDE WOUND 1ST 20 SQ CM OR < ICD-10 Diagnosis Description L97.512 Non-pressure  had progression versus a previous study. She had some stenosis in the SFA a 7599% stenosis in the proximal mid SFA mild stenosis in the distal SFA and mild stenosis in the proximal PTA. On the left she had a patent left femoral to ATA bypass graft with mild stenosis It was recommended that she undergo angiography for limb salvage on the right. That is being arranged as we speak. Her wound is on the right first metatarsal head she has been using collagen as the primary dressing. She tells me she is making every effort to stop smoking 07-19-2023 upon evaluation today patient appears to be doing okay in regard to her foot ulcer although her arterial  procedure/revascularization keeps getting pushed back. She states that they are talking about pushing back and left nail due to the fact that she changed insurances and I Karla not have a being "updated insurance card number". With that being said I explained to the patient that should be something that she should be able to get on line easily by making an account. She is also called [place] [facility] and they said they were sending it but it would be about a week before described by next Wednesday. She really does not have time to be holding on this she needs to be proceeding with intervention as quickly as possible. 07-26-2023 upon evaluation today patient appears to be doing well currently in regard to her wound. This is actually showing signs of looking about the same I did clear a lot of callus up last week this actually looks better to me from an overall visual standpoint than last week though it is a little larger because of the callus I had removed. With that being said I feel like she is actually making really good progress here towards complete closure. No fevers, chills, nausea, vomiting, or diarrhea. Electronic Signature(s) Signed: 07/26/2023 11:34:33 AM By: Allen Derry PA-C Entered By: Allen Derry on 07/26/2023 11:34:33 [name] ([id number]) [id number] Page 5 of 12 -------------------------------------------------------------------------------- Physical Exam Details Patient Name: Date of Service: [name]. 07/26/2023 10:15 A M Medical Record Number: [id number] Patient Account Number: [id number] Date of Birth/Sex: Treating RN: June 08, 1961 (62 y.o. F) Primary Care Provider: [name] Other Clinician: Referring Provider: Treating Provider/Extender: [name], [name] Weeks in Treatment: 14 Constitutional Obese and well-hydrated in no acute distress. Respiratory normal breathing without difficulty. Psychiatric this patient is  able to make decisions and demonstrates good insight into disease process. Alert and Oriented x 3. pleasant and cooperative. Notes Upon inspection patient's wound bed actually showed signs of good granulation epithelization at this point. Fortunately I Karla not see any evidence of worsening overall and I Karla believe that the patient is making excellent headway towards complete closure which is great news. She does seem to be tolerating the dressing changes without complication and I am very happy in that regard as well. Electronic Signature(s) Signed: 07/26/2023 11:34:58 AM By: Allen Derry PA-C Entered By: Allen Derry on 07/26/2023 11:34:57 -------------------------------------------------------------------------------- Physician Orders Details Patient Name: Date of Service: [name], Karla [name] 07/26/2023 10:15 A M Medical Record Number: [id number] Patient Account Number: [id number] Date of Birth/Sex: Treating RN: 1961/09/16 (61 y.o. 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 She had some stenosis in the SFA a 7599% stenosis in the proximal mid SFA mild stenosis in the distal SFA and mild stenosis in the proximal PTA. On the left she had a patent left femoral to ATA bypass graft with mild stenosis It was recommended that she undergo angiography for limb salvage on the right. That is being arranged as we speak. Her wound is on the right first metatarsal head she has been using collagen as the primary dressing. She tells me she is making every effort to stop smoking 07-19-2023 upon evaluation today patient appears to be doing okay in regard to her foot ulcer although her arterial  procedure/revascularization keeps getting pushed back. She states that they are talking about pushing back and left nail due to the fact that she changed insurances and I Karla not have a being "updated insurance card number". With that being said I explained to the patient that should be something that she should be able to get on line easily by making an account. She is also called [place] [facility] and they said they were sending it but it would be about a week before described by next Wednesday. She really does not have time to be holding on this she needs to be proceeding with intervention as quickly as possible. 07-26-2023 upon evaluation today patient appears to be doing well currently in regard to her wound. This is actually showing signs of looking about the same I did clear a lot of callus up last week this actually looks better to me from an overall visual standpoint than last week though it is a little larger because of the callus I had removed. With that being said I feel like she is actually making really good progress here towards complete closure. No fevers, chills, nausea, vomiting, or diarrhea. Electronic Signature(s) Signed: 07/26/2023 11:34:33 AM By: Allen Derry PA-C Entered By: Allen Derry on 07/26/2023 11:34:33 [name] ([id number]) [id number] Page 5 of 12 -------------------------------------------------------------------------------- Physical Exam Details Patient Name: Date of Service: [name]. 07/26/2023 10:15 A M Medical Record Number: [id number] Patient Account Number: [id number] Date of Birth/Sex: Treating RN: June 08, 1961 (62 y.o. F) Primary Care Provider: [name] Other Clinician: Referring Provider: Treating Provider/Extender: [name], [name] Weeks in Treatment: 14 Constitutional Obese and well-hydrated in no acute distress. Respiratory normal breathing without difficulty. Psychiatric this patient is  able to make decisions and demonstrates good insight into disease process. Alert and Oriented x 3. pleasant and cooperative. Notes Upon inspection patient's wound bed actually showed signs of good granulation epithelization at this point. Fortunately I Karla not see any evidence of worsening overall and I Karla believe that the patient is making excellent headway towards complete closure which is great news. She does seem to be tolerating the dressing changes without complication and I am very happy in that regard as well. Electronic Signature(s) Signed: 07/26/2023 11:34:58 AM By: Allen Derry PA-C Entered By: Allen Derry on 07/26/2023 11:34:57 -------------------------------------------------------------------------------- Physician Orders Details Patient Name: Date of Service: [name], Karla [name] 07/26/2023 10:15 A M Medical Record Number: [id number] Patient Account Number: [id number] Date of Birth/Sex: Treating RN: 1961/09/16 (61 y.o. 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 Edema Control - Lymphedema / SCD / Other: Elevate legs to the level of the heart or above for 30 minutes daily and/or when sitting for 3-4 times a day throughout the day. Avoid standing for long periods of time. Patient to wear own compression stockings every day. Exercise regularly - As tolerated Moisturize legs daily. Other Edema Control Orders/Instructions: - Recommend Compression stocking 15-68mm/Hg. Brochure for El[employer] Therapy with printed leg measurements. WOUND #23: - Metatarsal head first Wound Laterality: Right Cleanser: Soap and Water 1 x Per Week/30 Days Discharge Instructions: May shower and wash wound with dial antibacterial soap and water prior to dressing change. Cleanser: Wound Cleanser 1 x Per Week/30 Days Discharge Instructions: Cleanse the wound with wound cleanser prior to applying a clean dressing using gauze sponges, not tissue or cotton balls. Peri-Wound Care: Sween Lotion (Moisturizing lotion) 1 x Per Week/30 Days Discharge Instructions: Apply moisturizing lotion as directed Prim Dressing: Promogran Prisma Matrix, 4.34 (sq in) (silver collagen) 1 x Per Week/30 Days ary Discharge Instructions: Moisten collagen with saline or hydrogel Secondary Dressing: Optifoam Non-Adhesive Dressing, 4x4 in 1 x Per Week/30 Days Discharge Instructions: Apply foam donut and gauze Com pression Wrap: Kerlix Roll 4.5x3.1 (in/yd) 1 x Per Week/30 Days Discharge Instructions:  Apply Kerlix and Coban compression as directed. Com pression Wrap: Coban Self-Adherent Wrap 4x5 (in/yd) 1 x Per Week/30 Days Discharge Instructions: Apply over Kerlix as directed. 1. I would recommend that we have the patient continue to monitor for any evidence of infection or worsening. Overall based on biopsy and I Karla believe that we will making good headway towards closure. 2. Also can recommend the patient should continue to monitor for any signs of infection. Overall I Karla not see any evidence of infection at the moment. 3. She does have her appointment for the angiogram on Friday with vascular. Following this am hopeful she will have even better blood flow that we will be able to actually see some improvement going forward as far as getting this area to heal is concerned. We will see patient back for reevaluation in 1 week here in the clinic. If anything worsens or changes patient will contact our office for additional recommendations. Electronic Signature(s) Signed: 07/28/2023 11:40:57 AM By: Shawn Stall RN, BSN Signed: 07/28/2023 1:20:16 PM By: Allen Derry PA-C Previous Signature: 07/26/2023 11:35:36 AM Version By: Allen Derry PA-C Entered By: Shawn Stall on 07/28/2023 11:35:57 -------------------------------------------------------------------------------- SuperBill Details Patient Name: Date of Service: [name], Karla [name] 07/26/2023 Medical Record Number: [id number] Patient Account Number: [id number] Date of Birth/Sex: Treating RN: [birth year] (61 y.o. 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 L97.522 Non-pressure chronic ulcer of other part of left foot with fat layer  exposed E11.622 Type 2 diabetes mellitus with other skin ulcer L97.812 Non-pressure chronic ulcer of other part of right lower leg with fat layer exposed I73.89 Other specified peripheral vascular diseases MAYLI, HANA ([id number]) [id number] Page 12 of 12 I87.331 Chronic venous hypertension (idiopathic) with ulcer and inflammation of right lower extremity Facility Procedures : CPT4 Code: [id number] Description: 97597 - DEBRIDE WOUND 1ST 20 SQ CM OR < ICD-10 Diagnosis Description L97.512 Non-pressure  No Yes lower extremity Inactive Problems Resolved Problems Electronic Signature(s) Signed: 07/26/2023 10:49:19 AM By: Allen Derry PA-C Entered By: Allen Derry on 07/26/2023 10:49:18 -------------------------------------------------------------------------------- Progress Note Details Patient Name: Date of Service: Karla LEY, Karla [name] 07/26/2023 10:15 A M Medical Record  Number: [id number] Patient Account Number: [id number] Date of Birth/Sex: Treating RN: 11/02/1961 (62 y.o. F) Primary Care Provider: [name] Other Clinician: Referring Provider: Treating Provider/Extender: [name], [name] Weeks in Treatment: 14 Subjective Chief Complaint Information obtained from Patient Bilateral LE Ulcers History of Present Illness (HPI) This 62 year old patient who has a very long significant history of diabetes mellitus, previous alcohol and nicotine abuse, chronic data disease, COPD, diabetes mellitus, height hypertension, critical lower limb ischemia with several wounds being managed at the wound Center at [facility] for over a year. She was recently in the ER at [facility] and was referred to our center. He has had a long history of critical limb ischemia and over a period of time has had balloon angioplasties in March 2016, endarterectomy of the left carotid, lower extremity angiogram and treatment by Dr. Nanetta Batty, several cardiac catheterizations. Most recently she had an x-ray of her left foot while in the ER which showed no acute abnormality. during this ER visit she was started on ciprofloxacin and asked to continue with the wound care physicians at [facility]. Her last ABI done in June 2017 showed the right side to be 0.28 in the left side to be 0.48. Her right toe brachial index was 0.17 on the right and 0.27 on the left. her last hemoglobin A1c was 11.1% She continues to smoke about a pack of cigarettes a day. 03/28/2017 -- -- right foot x-ray -- IMPRESSION:Areas of soft tissue swelling. Mild subluxation second PIP joint. No frank dislocation or fracture. No erosive change or bony destruction. No soft tissue ulceration or radiopaque foreign body evident by radiography. There is plantar fascia calcification with a nearby inferior calcaneal spur. There are foci of arterial vascular calcification. 04/04/2017 -- the patient  continues to be noncompliant and continues to complain of a lot of pain and has not done anything about quitting smoking Readmission: 06/26/18 on evaluation today patient presents for reevaluation she has not been seen in our office since May 2018. Since she was last seen here in our office she has undergone testing at Dr. Durenda Age office where it was revealed that she had an ABI of 0.68 with her previous ABI being 0.64 and a left ABI of 0.38 with previous ABI being 0.34 this study which was performed on 04/05/18 was pretty much equivalent to the study performed on 11/22/17. The findings in the end revealed on the right would appear to be moderate right lower extremity arterial disease on the left Dr. Allyson Sabal states moderate in the report but unfortunately this appears to be much more severe at 0.38 compared to the right. Subsequently the patient was scheduled to have angiography with Dr. Allyson Sabal on 04/12/18. This was however canceled due to the fact that the patient was found to have chronic kidney disease stage III and it was to the point that he did not feel that it will be safe to pursue angiography at that point. She has not been on any antibiotics recently. At this point Dr. Allyson Sabal has not rescheduled anything as far as the injured Calhan according to the patient he is somewhat reluctant to Karla so. Nonetheless with her diminished blood flow this is gonna make  had progression versus a previous study. She had some stenosis in the SFA a 7599% stenosis in the proximal mid SFA mild stenosis in the distal SFA and mild stenosis in the proximal PTA. On the left she had a patent left femoral to ATA bypass graft with mild stenosis It was recommended that she undergo angiography for limb salvage on the right. That is being arranged as we speak. Her wound is on the right first metatarsal head she has been using collagen as the primary dressing. She tells me she is making every effort to stop smoking 07-19-2023 upon evaluation today patient appears to be doing okay in regard to her foot ulcer although her arterial  procedure/revascularization keeps getting pushed back. She states that they are talking about pushing back and left nail due to the fact that she changed insurances and I Karla not have a being "updated insurance card number". With that being said I explained to the patient that should be something that she should be able to get on line easily by making an account. She is also called [place] [facility] and they said they were sending it but it would be about a week before described by next Wednesday. She really does not have time to be holding on this she needs to be proceeding with intervention as quickly as possible. 07-26-2023 upon evaluation today patient appears to be doing well currently in regard to her wound. This is actually showing signs of looking about the same I did clear a lot of callus up last week this actually looks better to me from an overall visual standpoint than last week though it is a little larger because of the callus I had removed. With that being said I feel like she is actually making really good progress here towards complete closure. No fevers, chills, nausea, vomiting, or diarrhea. Electronic Signature(s) Signed: 07/26/2023 11:34:33 AM By: Allen Derry PA-C Entered By: Allen Derry on 07/26/2023 11:34:33 [name] ([id number]) [id number] Page 5 of 12 -------------------------------------------------------------------------------- Physical Exam Details Patient Name: Date of Service: [name]. 07/26/2023 10:15 A M Medical Record Number: [id number] Patient Account Number: [id number] Date of Birth/Sex: Treating RN: June 08, 1961 (62 y.o. F) Primary Care Provider: [name] Other Clinician: Referring Provider: Treating Provider/Extender: [name], [name] Weeks in Treatment: 14 Constitutional Obese and well-hydrated in no acute distress. Respiratory normal breathing without difficulty. Psychiatric this patient is  able to make decisions and demonstrates good insight into disease process. Alert and Oriented x 3. pleasant and cooperative. Notes Upon inspection patient's wound bed actually showed signs of good granulation epithelization at this point. Fortunately I Karla not see any evidence of worsening overall and I Karla believe that the patient is making excellent headway towards complete closure which is great news. She does seem to be tolerating the dressing changes without complication and I am very happy in that regard as well. Electronic Signature(s) Signed: 07/26/2023 11:34:58 AM By: Allen Derry PA-C Entered By: Allen Derry on 07/26/2023 11:34:57 -------------------------------------------------------------------------------- Physician Orders Details Patient Name: Date of Service: [name], Karla [name] 07/26/2023 10:15 A M Medical Record Number: [id number] Patient Account Number: [id number] Date of Birth/Sex: Treating RN: 1961/09/16 (61 y.o. 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 She had some stenosis in the SFA a 7599% stenosis in the proximal mid SFA mild stenosis in the distal SFA and mild stenosis in the proximal PTA. On the left she had a patent left femoral to ATA bypass graft with mild stenosis It was recommended that she undergo angiography for limb salvage on the right. That is being arranged as we speak. Her wound is on the right first metatarsal head she has been using collagen as the primary dressing. She tells me she is making every effort to stop smoking 07-19-2023 upon evaluation today patient appears to be doing okay in regard to her foot ulcer although her arterial  procedure/revascularization keeps getting pushed back. She states that they are talking about pushing back and left nail due to the fact that she changed insurances and I Karla not have a being "updated insurance card number". With that being said I explained to the patient that should be something that she should be able to get on line easily by making an account. She is also called [place] [facility] and they said they were sending it but it would be about a week before described by next Wednesday. She really does not have time to be holding on this she needs to be proceeding with intervention as quickly as possible. 07-26-2023 upon evaluation today patient appears to be doing well currently in regard to her wound. This is actually showing signs of looking about the same I did clear a lot of callus up last week this actually looks better to me from an overall visual standpoint than last week though it is a little larger because of the callus I had removed. With that being said I feel like she is actually making really good progress here towards complete closure. No fevers, chills, nausea, vomiting, or diarrhea. Electronic Signature(s) Signed: 07/26/2023 11:34:33 AM By: Allen Derry PA-C Entered By: Allen Derry on 07/26/2023 11:34:33 [name] ([id number]) [id number] Page 5 of 12 -------------------------------------------------------------------------------- Physical Exam Details Patient Name: Date of Service: [name]. 07/26/2023 10:15 A M Medical Record Number: [id number] Patient Account Number: [id number] Date of Birth/Sex: Treating RN: June 08, 1961 (62 y.o. F) Primary Care Provider: [name] Other Clinician: Referring Provider: Treating Provider/Extender: [name], [name] Weeks in Treatment: 14 Constitutional Obese and well-hydrated in no acute distress. Respiratory normal breathing without difficulty. Psychiatric this patient is  able to make decisions and demonstrates good insight into disease process. Alert and Oriented x 3. pleasant and cooperative. Notes Upon inspection patient's wound bed actually showed signs of good granulation epithelization at this point. Fortunately I Karla not see any evidence of worsening overall and I Karla believe that the patient is making excellent headway towards complete closure which is great news. She does seem to be tolerating the dressing changes without complication and I am very happy in that regard as well. Electronic Signature(s) Signed: 07/26/2023 11:34:58 AM By: Allen Derry PA-C Entered By: Allen Derry on 07/26/2023 11:34:57 -------------------------------------------------------------------------------- Physician Orders Details Patient Name: Date of Service: [name], Karla [name] 07/26/2023 10:15 A M Medical Record Number: [id number] Patient Account Number: [id number] Date of Birth/Sex: Treating RN: 1961/09/16 (61 y.o. 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 07/26/2023 10:15 A M Medical Record  Number: [id number] Patient Account Number: [id number] Date of Birth/Sex: Treating RN: 11/02/1961 (62 y.o. F) Primary Care Provider: [name] Other Clinician: Referring Provider: Treating Provider/Extender: [name], [name] Weeks in Treatment: 14 Subjective Chief Complaint Information obtained from Patient Bilateral LE Ulcers History of Present Illness (HPI) This 62 year old patient who has a very long significant history of diabetes mellitus, previous alcohol and nicotine abuse, chronic data disease, COPD, diabetes mellitus, height hypertension, critical lower limb ischemia with several wounds being managed at the wound Center at [facility] for over a year. She was recently in the ER at [facility] and was referred to our center. He has had a long history of critical limb ischemia and over a period of time has had balloon angioplasties in March 2016, endarterectomy of the left carotid, lower extremity angiogram and treatment by Dr. Nanetta Batty, several cardiac catheterizations. Most recently she had an x-ray of her left foot while in the ER which showed no acute abnormality. during this ER visit she was started on ciprofloxacin and asked to continue with the wound care physicians at [facility]. Her last ABI done in June 2017 showed the right side to be 0.28 in the left side to be 0.48. Her right toe brachial index was 0.17 on the right and 0.27 on the left. her last hemoglobin A1c was 11.1% She continues to smoke about a pack of cigarettes a day. 03/28/2017 -- -- right foot x-ray -- IMPRESSION:Areas of soft tissue swelling. Mild subluxation second PIP joint. No frank dislocation or fracture. No erosive change or bony destruction. No soft tissue ulceration or radiopaque foreign body evident by radiography. There is plantar fascia calcification with a nearby inferior calcaneal spur. There are foci of arterial vascular calcification. 04/04/2017 -- the patient  continues to be noncompliant and continues to complain of a lot of pain and has not done anything about quitting smoking Readmission: 06/26/18 on evaluation today patient presents for reevaluation she has not been seen in our office since May 2018. Since she was last seen here in our office she has undergone testing at Dr. Durenda Age office where it was revealed that she had an ABI of 0.68 with her previous ABI being 0.64 and a left ABI of 0.38 with previous ABI being 0.34 this study which was performed on 04/05/18 was pretty much equivalent to the study performed on 11/22/17. The findings in the end revealed on the right would appear to be moderate right lower extremity arterial disease on the left Dr. Allyson Sabal states moderate in the report but unfortunately this appears to be much more severe at 0.38 compared to the right. Subsequently the patient was scheduled to have angiography with Dr. Allyson Sabal on 04/12/18. This was however canceled due to the fact that the patient was found to have chronic kidney disease stage III and it was to the point that he did not feel that it will be safe to pursue angiography at that point. She has not been on any antibiotics recently. At this point Dr. Allyson Sabal has not rescheduled anything as far as the injured [place] according to the patient he is somewhat reluctant to Karla so. Nonetheless with her diminished blood flow this is gonna make  No Yes lower extremity Inactive Problems Resolved Problems Electronic Signature(s) Signed: 07/26/2023 10:49:19 AM By: Allen Derry PA-C Entered By: Allen Derry on 07/26/2023 10:49:18 -------------------------------------------------------------------------------- Progress Note Details Patient Name: Date of Service: Karla LEY, Karla [name] 07/26/2023 10:15 A M Medical Record  Number: [id number] Patient Account Number: [id number] Date of Birth/Sex: Treating RN: 11/02/1961 (62 y.o. F) Primary Care Provider: [name] Other Clinician: Referring Provider: Treating Provider/Extender: [name], [name] Weeks in Treatment: 14 Subjective Chief Complaint Information obtained from Patient Bilateral LE Ulcers History of Present Illness (HPI) This 62 year old patient who has a very long significant history of diabetes mellitus, previous alcohol and nicotine abuse, chronic data disease, COPD, diabetes mellitus, height hypertension, critical lower limb ischemia with several wounds being managed at the wound Center at [facility] for over a year. She was recently in the ER at [facility] and was referred to our center. He has had a long history of critical limb ischemia and over a period of time has had balloon angioplasties in March 2016, endarterectomy of the left carotid, lower extremity angiogram and treatment by Dr. Nanetta Batty, several cardiac catheterizations. Most recently she had an x-ray of her left foot while in the ER which showed no acute abnormality. during this ER visit she was started on ciprofloxacin and asked to continue with the wound care physicians at [facility]. Her last ABI done in June 2017 showed the right side to be 0.28 in the left side to be 0.48. Her right toe brachial index was 0.17 on the right and 0.27 on the left. her last hemoglobin A1c was 11.1% She continues to smoke about a pack of cigarettes a day. 03/28/2017 -- -- right foot x-ray -- IMPRESSION:Areas of soft tissue swelling. Mild subluxation second PIP joint. No frank dislocation or fracture. No erosive change or bony destruction. No soft tissue ulceration or radiopaque foreign body evident by radiography. There is plantar fascia calcification with a nearby inferior calcaneal spur. There are foci of arterial vascular calcification. 04/04/2017 -- the patient  continues to be noncompliant and continues to complain of a lot of pain and has not done anything about quitting smoking Readmission: 06/26/18 on evaluation today patient presents for reevaluation she has not been seen in our office since May 2018. Since she was last seen here in our office she has undergone testing at Dr. Durenda Age office where it was revealed that she had an ABI of 0.68 with her previous ABI being 0.64 and a left ABI of 0.38 with previous ABI being 0.34 this study which was performed on 04/05/18 was pretty much equivalent to the study performed on 11/22/17. The findings in the end revealed on the right would appear to be moderate right lower extremity arterial disease on the left Dr. Allyson Sabal states moderate in the report but unfortunately this appears to be much more severe at 0.38 compared to the right. Subsequently the patient was scheduled to have angiography with Dr. Allyson Sabal on 04/12/18. This was however canceled due to the fact that the patient was found to have chronic kidney disease stage III and it was to the point that he did not feel that it will be safe to pursue angiography at that point. She has not been on any antibiotics recently. At this point Dr. Allyson Sabal has not rescheduled anything as far as the injured [place] according to the patient he is somewhat reluctant to Karla so. Nonetheless with her diminished blood flow this is gonna make  had progression versus a previous study. She had some stenosis in the SFA a 7599% stenosis in the proximal mid SFA mild stenosis in the distal SFA and mild stenosis in the proximal PTA. On the left she had a patent left femoral to ATA bypass graft with mild stenosis It was recommended that she undergo angiography for limb salvage on the right. That is being arranged as we speak. Her wound is on the right first metatarsal head she has been using collagen as the primary dressing. She tells me she is making every effort to stop smoking 07-19-2023 upon evaluation today patient appears to be doing okay in regard to her foot ulcer although her arterial  procedure/revascularization keeps getting pushed back. She states that they are talking about pushing back and left nail due to the fact that she changed insurances and I Karla not have a being "updated insurance card number". With that being said I explained to the patient that should be something that she should be able to get on line easily by making an account. She is also called [place] [facility] and they said they were sending it but it would be about a week before described by next Wednesday. She really does not have time to be holding on this she needs to be proceeding with intervention as quickly as possible. 07-26-2023 upon evaluation today patient appears to be doing well currently in regard to her wound. This is actually showing signs of looking about the same I did clear a lot of callus up last week this actually looks better to me from an overall visual standpoint than last week though it is a little larger because of the callus I had removed. With that being said I feel like she is actually making really good progress here towards complete closure. No fevers, chills, nausea, vomiting, or diarrhea. Electronic Signature(s) Signed: 07/26/2023 11:34:33 AM By: Allen Derry PA-C Entered By: Allen Derry on 07/26/2023 11:34:33 [name] ([id number]) [id number] Page 5 of 12 -------------------------------------------------------------------------------- Physical Exam Details Patient Name: Date of Service: [name]. 07/26/2023 10:15 A M Medical Record Number: [id number] Patient Account Number: [id number] Date of Birth/Sex: Treating RN: June 08, 1961 (62 y.o. F) Primary Care Provider: [name] Other Clinician: Referring Provider: Treating Provider/Extender: [name], [name] Weeks in Treatment: 14 Constitutional Obese and well-hydrated in no acute distress. Respiratory normal breathing without difficulty. Psychiatric this patient is  able to make decisions and demonstrates good insight into disease process. Alert and Oriented x 3. pleasant and cooperative. Notes Upon inspection patient's wound bed actually showed signs of good granulation epithelization at this point. Fortunately I Karla not see any evidence of worsening overall and I Karla believe that the patient is making excellent headway towards complete closure which is great news. She does seem to be tolerating the dressing changes without complication and I am very happy in that regard as well. Electronic Signature(s) Signed: 07/26/2023 11:34:58 AM By: Allen Derry PA-C Entered By: Allen Derry on 07/26/2023 11:34:57 -------------------------------------------------------------------------------- Physician Orders Details Patient Name: Date of Service: [name], Karla [name] 07/26/2023 10:15 A M Medical Record Number: [id number] Patient Account Number: [id number] Date of Birth/Sex: Treating RN: 1961/09/16 (61 y.o. 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 She had some stenosis in the SFA a 7599% stenosis in the proximal mid SFA mild stenosis in the distal SFA and mild stenosis in the proximal PTA. On the left she had a patent left femoral to ATA bypass graft with mild stenosis It was recommended that she undergo angiography for limb salvage on the right. That is being arranged as we speak. Her wound is on the right first metatarsal head she has been using collagen as the primary dressing. She tells me she is making every effort to stop smoking 07-19-2023 upon evaluation today patient appears to be doing okay in regard to her foot ulcer although her arterial  procedure/revascularization keeps getting pushed back. She states that they are talking about pushing back and left nail due to the fact that she changed insurances and I Karla not have a being "updated insurance card number". With that being said I explained to the patient that should be something that she should be able to get on line easily by making an account. She is also called [place] [facility] and they said they were sending it but it would be about a week before described by next Wednesday. She really does not have time to be holding on this she needs to be proceeding with intervention as quickly as possible. 07-26-2023 upon evaluation today patient appears to be doing well currently in regard to her wound. This is actually showing signs of looking about the same I did clear a lot of callus up last week this actually looks better to me from an overall visual standpoint than last week though it is a little larger because of the callus I had removed. With that being said I feel like she is actually making really good progress here towards complete closure. No fevers, chills, nausea, vomiting, or diarrhea. Electronic Signature(s) Signed: 07/26/2023 11:34:33 AM By: Allen Derry PA-C Entered By: Allen Derry on 07/26/2023 11:34:33 [name] ([id number]) [id number] Page 5 of 12 -------------------------------------------------------------------------------- Physical Exam Details Patient Name: Date of Service: [name]. 07/26/2023 10:15 A M Medical Record Number: [id number] Patient Account Number: [id number] Date of Birth/Sex: Treating RN: June 08, 1961 (62 y.o. F) Primary Care Provider: [name] Other Clinician: Referring Provider: Treating Provider/Extender: [name], [name] Weeks in Treatment: 14 Constitutional Obese and well-hydrated in no acute distress. Respiratory normal breathing without difficulty. Psychiatric this patient is  able to make decisions and demonstrates good insight into disease process. Alert and Oriented x 3. pleasant and cooperative. Notes Upon inspection patient's wound bed actually showed signs of good granulation epithelization at this point. Fortunately I Karla not see any evidence of worsening overall and I Karla believe that the patient is making excellent headway towards complete closure which is great news. She does seem to be tolerating the dressing changes without complication and I am very happy in that regard as well. Electronic Signature(s) Signed: 07/26/2023 11:34:58 AM By: Allen Derry PA-C Entered By: Allen Derry on 07/26/2023 11:34:57 -------------------------------------------------------------------------------- Physician Orders Details Patient Name: Date of Service: [name], Karla [name] 07/26/2023 10:15 A M Medical Record Number: [id number] Patient Account Number: [id number] Date of Birth/Sex: Treating RN: 1961/09/16 (61 y.o. 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

## 2023-07-27 ENCOUNTER — Inpatient Hospital Stay (HOSPITAL_COMMUNITY)
Admission: EM | Admit: 2023-07-27 | Discharge: 2023-08-05 | DRG: 871 | Disposition: A | Discharge status: Home / Self Care | Payer: 59 | Attending: Internal Medicine | Admitting: Internal Medicine

## 2023-07-27 ENCOUNTER — Other Ambulatory Visit: Payer: Self-pay

## 2023-07-27 ENCOUNTER — Other Ambulatory Visit (HOSPITAL_COMMUNITY): Payer: Self-pay

## 2023-07-27 ENCOUNTER — Emergency Department (HOSPITAL_COMMUNITY): Payer: 59

## 2023-07-27 DIAGNOSIS — Z79899 Other long term (current) drug therapy: Secondary | ICD-10-CM

## 2023-07-27 DIAGNOSIS — I480 Paroxysmal atrial fibrillation: Secondary | ICD-10-CM | POA: Diagnosis present

## 2023-07-27 DIAGNOSIS — E872 Acidosis, unspecified: Secondary | ICD-10-CM | POA: Diagnosis present

## 2023-07-27 DIAGNOSIS — E861 Hypovolemia: Secondary | ICD-10-CM | POA: Diagnosis present

## 2023-07-27 DIAGNOSIS — J441 Chronic obstructive pulmonary disease with (acute) exacerbation: Secondary | ICD-10-CM | POA: Diagnosis present

## 2023-07-27 DIAGNOSIS — I130 Hypertensive heart and chronic kidney disease with heart failure and stage 1 through stage 4 chronic kidney disease, or unspecified chronic kidney disease: Secondary | ICD-10-CM | POA: Diagnosis present

## 2023-07-27 DIAGNOSIS — N390 Urinary tract infection, site not specified: Secondary | ICD-10-CM | POA: Diagnosis present

## 2023-07-27 DIAGNOSIS — E1122 Type 2 diabetes mellitus with diabetic chronic kidney disease: Secondary | ICD-10-CM | POA: Diagnosis present

## 2023-07-27 DIAGNOSIS — G9340 Encephalopathy, unspecified: Secondary | ICD-10-CM | POA: Diagnosis not present

## 2023-07-27 DIAGNOSIS — J9601 Acute respiratory failure with hypoxia: Secondary | ICD-10-CM | POA: Diagnosis present

## 2023-07-27 DIAGNOSIS — E039 Hypothyroidism, unspecified: Secondary | ICD-10-CM | POA: Diagnosis present

## 2023-07-27 DIAGNOSIS — E1165 Type 2 diabetes mellitus with hyperglycemia: Secondary | ICD-10-CM | POA: Diagnosis present

## 2023-07-27 DIAGNOSIS — R6520 Severe sepsis without septic shock: Secondary | ICD-10-CM | POA: Diagnosis present

## 2023-07-27 DIAGNOSIS — F419 Anxiety disorder, unspecified: Secondary | ICD-10-CM | POA: Diagnosis present

## 2023-07-27 DIAGNOSIS — L97519 Non-pressure chronic ulcer of other part of right foot with unspecified severity: Secondary | ICD-10-CM | POA: Diagnosis present

## 2023-07-27 DIAGNOSIS — N183 Chronic kidney disease, stage 3 unspecified: Secondary | ICD-10-CM

## 2023-07-27 DIAGNOSIS — K7030 Alcoholic cirrhosis of liver without ascites: Secondary | ICD-10-CM | POA: Diagnosis present

## 2023-07-27 DIAGNOSIS — E871 Hypo-osmolality and hyponatremia: Secondary | ICD-10-CM | POA: Diagnosis present

## 2023-07-27 DIAGNOSIS — D638 Anemia in other chronic diseases classified elsewhere: Secondary | ICD-10-CM | POA: Diagnosis present

## 2023-07-27 DIAGNOSIS — G9341 Metabolic encephalopathy: Secondary | ICD-10-CM | POA: Diagnosis present

## 2023-07-27 DIAGNOSIS — M7989 Other specified soft tissue disorders: Secondary | ICD-10-CM | POA: Diagnosis not present

## 2023-07-27 DIAGNOSIS — Z8249 Family history of ischemic heart disease and other diseases of the circulatory system: Secondary | ICD-10-CM

## 2023-07-27 DIAGNOSIS — Z89421 Acquired absence of other right toe(s): Secondary | ICD-10-CM

## 2023-07-27 DIAGNOSIS — Z832 Family history of diseases of the blood and blood-forming organs and certain disorders involving the immune mechanism: Secondary | ICD-10-CM

## 2023-07-27 DIAGNOSIS — Z7951 Long term (current) use of inhaled steroids: Secondary | ICD-10-CM

## 2023-07-27 DIAGNOSIS — Z888 Allergy status to other drugs, medicaments and biological substances status: Secondary | ICD-10-CM

## 2023-07-27 DIAGNOSIS — E11621 Type 2 diabetes mellitus with foot ulcer: Secondary | ICD-10-CM | POA: Diagnosis present

## 2023-07-27 DIAGNOSIS — F17210 Nicotine dependence, cigarettes, uncomplicated: Secondary | ICD-10-CM | POA: Diagnosis present

## 2023-07-27 DIAGNOSIS — Z794 Long term (current) use of insulin: Secondary | ICD-10-CM

## 2023-07-27 DIAGNOSIS — N1832 Chronic kidney disease, stage 3b: Secondary | ICD-10-CM | POA: Diagnosis present

## 2023-07-27 DIAGNOSIS — L97219 Non-pressure chronic ulcer of right calf with unspecified severity: Secondary | ICD-10-CM | POA: Diagnosis present

## 2023-07-27 DIAGNOSIS — E669 Obesity, unspecified: Secondary | ICD-10-CM | POA: Diagnosis present

## 2023-07-27 DIAGNOSIS — Z6841 Body Mass Index (BMI) 40.0 and over, adult: Secondary | ICD-10-CM

## 2023-07-27 DIAGNOSIS — Z7901 Long term (current) use of anticoagulants: Secondary | ICD-10-CM

## 2023-07-27 DIAGNOSIS — A419 Sepsis, unspecified organism: Secondary | ICD-10-CM | POA: Diagnosis present

## 2023-07-27 DIAGNOSIS — Z7984 Long term (current) use of oral hypoglycemic drugs: Secondary | ICD-10-CM

## 2023-07-27 DIAGNOSIS — G4733 Obstructive sleep apnea (adult) (pediatric): Secondary | ICD-10-CM | POA: Diagnosis present

## 2023-07-27 DIAGNOSIS — I5042 Chronic combined systolic (congestive) and diastolic (congestive) heart failure: Secondary | ICD-10-CM | POA: Diagnosis present

## 2023-07-27 DIAGNOSIS — I70201 Unspecified atherosclerosis of native arteries of extremities, right leg: Secondary | ICD-10-CM | POA: Diagnosis present

## 2023-07-27 DIAGNOSIS — I2510 Atherosclerotic heart disease of native coronary artery without angina pectoris: Secondary | ICD-10-CM | POA: Diagnosis present

## 2023-07-27 DIAGNOSIS — Z79890 Hormone replacement therapy (postmenopausal): Secondary | ICD-10-CM

## 2023-07-27 DIAGNOSIS — K219 Gastro-esophageal reflux disease without esophagitis: Secondary | ICD-10-CM | POA: Diagnosis present

## 2023-07-27 DIAGNOSIS — Z833 Family history of diabetes mellitus: Secondary | ICD-10-CM

## 2023-07-27 DIAGNOSIS — E1142 Type 2 diabetes mellitus with diabetic polyneuropathy: Secondary | ICD-10-CM | POA: Diagnosis present

## 2023-07-27 DIAGNOSIS — N179 Acute kidney failure, unspecified: Secondary | ICD-10-CM | POA: Diagnosis present

## 2023-07-27 DIAGNOSIS — E785 Hyperlipidemia, unspecified: Secondary | ICD-10-CM | POA: Diagnosis present

## 2023-07-27 DIAGNOSIS — M109 Gout, unspecified: Secondary | ICD-10-CM | POA: Diagnosis present

## 2023-07-27 DIAGNOSIS — Z825 Family history of asthma and other chronic lower respiratory diseases: Secondary | ICD-10-CM

## 2023-07-27 DIAGNOSIS — Z89422 Acquired absence of other left toe(s): Secondary | ICD-10-CM

## 2023-07-27 LAB — GLUCOSE, CAPILLARY
Glucose-Capillary: 303 mg/dL — ABNORMAL HIGH (ref 70–99)
Glucose-Capillary: 270 mg/dL — ABNORMAL HIGH (ref 70–99)
Glucose-Capillary: 220 mg/dL — ABNORMAL HIGH (ref 70–99)

## 2023-07-27 LAB — COMPREHENSIVE METABOLIC PANEL
ALT: 30 U/L (ref 0–44)
ALT: 27 U/L (ref 0–44)
AST: 39 U/L (ref 15–41)
AST: 26 U/L (ref 15–41)
Albumin: 3.1 g/dL — ABNORMAL LOW (ref 3.5–5.0)
Albumin: 2.9 g/dL — ABNORMAL LOW (ref 3.5–5.0)
Alkaline Phosphatase: 172 U/L — ABNORMAL HIGH (ref 38–126)
Alkaline Phosphatase: 154 U/L — ABNORMAL HIGH (ref 38–126)
Anion gap: 14 (ref 5–15)
Anion gap: 13 (ref 5–15)
BUN: 82 mg/dL — ABNORMAL HIGH (ref 6–20)
BUN: 70 mg/dL — ABNORMAL HIGH (ref 6–20)
CO2: 17 mmol/L — ABNORMAL LOW (ref 22–32)
CO2: 18 mmol/L — ABNORMAL LOW (ref 22–32)
Calcium: 8.5 mg/dL — ABNORMAL LOW (ref 8.9–10.3)
Calcium: 8.5 mg/dL — ABNORMAL LOW (ref 8.9–10.3)
Chloride: 102 mmol/L (ref 98–111)
Chloride: 105 mmol/L (ref 98–111)
Creatinine, Ser: 2.68 mg/dL — ABNORMAL HIGH (ref 0.44–1.00)
Creatinine, Ser: 2.08 mg/dL — ABNORMAL HIGH (ref 0.44–1.00)
GFR, Estimated: 20 mL/min — ABNORMAL LOW (ref >60)
GFR, Estimated: 27 mL/min — ABNORMAL LOW (ref >60)
Glucose, Bld: 340 mg/dL — ABNORMAL HIGH (ref 70–99)
Glucose, Bld: 325 mg/dL — ABNORMAL HIGH (ref 70–99)
Potassium: 5 mmol/L (ref 3.5–5.1)
Potassium: 4.7 mmol/L (ref 3.5–5.1)
Sodium: 133 mmol/L — ABNORMAL LOW (ref 135–145)
Sodium: 136 mmol/L (ref 135–145)
Total Bilirubin: 0.4 mg/dL (ref 0.3–1.2)
Total Bilirubin: 0.3 mg/dL (ref 0.3–1.2)
Total Protein: 7.1 g/dL (ref 6.5–8.1)
Total Protein: 7 g/dL (ref 6.5–8.1)

## 2023-07-27 LAB — CBC
HCT: 34.1 % — ABNORMAL LOW (ref 36.0–46.0)
Hemoglobin: 10.2 g/dL — ABNORMAL LOW (ref 12.0–15.0)
MCH: 27.9 pg (ref 26.0–34.0)
MCHC: 29.9 g/dL — ABNORMAL LOW (ref 30.0–36.0)
MCV: 93.4 fL (ref 80.0–100.0)
Platelets: 271 10*3/uL (ref 150–400)
RBC: 3.65 MIL/uL — ABNORMAL LOW (ref 3.87–5.11)
RDW: 14.6 % (ref 11.5–15.5)
WBC: 24.1 10*3/uL — ABNORMAL HIGH (ref 4.0–10.5)
nRBC: 0 % (ref 0.0–0.2)

## 2023-07-27 LAB — URINALYSIS, ROUTINE W REFLEX MICROSCOPIC
Bilirubin Urine: NEGATIVE
Glucose, UA: 50 mg/dL — AB
Ketones, ur: 5 mg/dL — AB
Nitrite: POSITIVE — AB
Protein, ur: 30 mg/dL — AB
Specific Gravity, Urine: 1.016 (ref 1.005–1.030)
pH: 5 (ref 5.0–8.0)

## 2023-07-27 LAB — I-STAT VENOUS BLOOD GAS, ED
Acid-base deficit: 8 mmol/L — ABNORMAL HIGH (ref 0.0–2.0)
Acid-base deficit: 8 mmol/L — ABNORMAL HIGH (ref 0.0–2.0)
Bicarbonate: 16.2 mmol/L — ABNORMAL LOW (ref 20.0–28.0)
Bicarbonate: 17.1 mmol/L — ABNORMAL LOW (ref 20.0–28.0)
Calcium, Ion: 0.98 mmol/L — ABNORMAL LOW (ref 1.15–1.40)
Calcium, Ion: 1.05 mmol/L — ABNORMAL LOW (ref 1.15–1.40)
HCT: 33 % — ABNORMAL LOW (ref 36.0–46.0)
HCT: 32 % — ABNORMAL LOW (ref 36.0–46.0)
Hemoglobin: 11.2 g/dL — ABNORMAL LOW (ref 12.0–15.0)
Hemoglobin: 10.9 g/dL — ABNORMAL LOW (ref 12.0–15.0)
O2 Saturation: 94 %
O2 Saturation: 93 %
Potassium: 5.2 mmol/L — ABNORMAL HIGH (ref 3.5–5.1)
Potassium: 4.9 mmol/L (ref 3.5–5.1)
Sodium: 134 mmol/L — ABNORMAL LOW (ref 135–145)
Sodium: 137 mmol/L (ref 135–145)
TCO2: 17 mmol/L — ABNORMAL LOW (ref 22–32)
TCO2: 18 mmol/L — ABNORMAL LOW (ref 22–32)
pCO2, Ven: 27.9 mmHg — ABNORMAL LOW (ref 44–60)
pCO2, Ven: 31.8 mmHg — ABNORMAL LOW (ref 44–60)
pH, Ven: 7.372 (ref 7.25–7.43)
pH, Ven: 7.339 (ref 7.25–7.43)
pO2, Ven: 70 mmHg — ABNORMAL HIGH (ref 32–45)
pO2, Ven: 71 mmHg — ABNORMAL HIGH (ref 32–45)

## 2023-07-27 LAB — LACTIC ACID, PLASMA: Lactic Acid, Venous: 1.5 mmol/L (ref 0.5–1.9)

## 2023-07-27 LAB — BETA-HYDROXYBUTYRIC ACID
Beta-Hydroxybutyric Acid: 0.07 mmol/L (ref 0.05–0.27)
Beta-Hydroxybutyric Acid: 0.26 mmol/L (ref 0.05–0.27)

## 2023-07-27 LAB — PROCALCITONIN: Procalcitonin: 24.17 ng/mL

## 2023-07-27 LAB — HIV ANTIBODY (ROUTINE TESTING W REFLEX): HIV Screen 4th Generation wRfx: NONREACTIVE

## 2023-07-27 LAB — CBG MONITORING, ED: Glucose-Capillary: 390 mg/dL — ABNORMAL HIGH (ref 70–99)

## 2023-07-27 LAB — TSH: TSH: 1.038 u[IU]/mL (ref 0.350–4.500)

## 2023-07-27 LAB — MAGNESIUM: Magnesium: 2.3 mg/dL (ref 1.7–2.4)

## 2023-07-27 LAB — I-STAT CG4 LACTIC ACID, ED: Lactic Acid, Venous: 1.4 mmol/L (ref 0.5–1.9)

## 2023-07-27 LAB — MRSA NEXT GEN BY PCR, NASAL: MRSA by PCR Next Gen: NOT DETECTED

## 2023-07-27 MED ORDER — LACTATED RINGERS IV BOLUS
1000.0000 mL | Freq: Once | INTRAVENOUS | Status: AC
  Administered 2023-07-27: 1000 mL via INTRAVENOUS

## 2023-07-27 MED ORDER — VANCOMYCIN HCL IN DEXTROSE 1-5 GM/200ML-% IV SOLN
1000.0000 mg | Freq: Once | INTRAVENOUS | Status: AC
  Administered 2023-07-27: 1000 mg via INTRAVENOUS
  Filled 2023-07-27: qty 200

## 2023-07-27 MED ORDER — IPRATROPIUM-ALBUTEROL 0.5-2.5 (3) MG/3ML IN SOLN
3.0000 mL | Freq: Once | RESPIRATORY_TRACT | Status: AC
  Administered 2023-07-27: 3 mL via RESPIRATORY_TRACT
  Filled 2023-07-27: qty 3

## 2023-07-27 MED ORDER — REVEFENACIN 175 MCG/3ML IN SOLN
175.0000 ug | Freq: Every day | RESPIRATORY_TRACT | Status: DC
  Administered 2023-07-28 – 2023-08-05 (×7): 175 ug via RESPIRATORY_TRACT
  Filled 2023-07-27 (×10): qty 3

## 2023-07-27 MED ORDER — INSULIN ASPART 100 UNIT/ML IJ SOLN
0.0000 [IU] | INTRAMUSCULAR | Status: DC
  Administered 2023-07-27: 7 [IU] via SUBCUTANEOUS
  Administered 2023-07-27: 15 [IU] via SUBCUTANEOUS
  Administered 2023-07-28 (×2): 11 [IU] via SUBCUTANEOUS
  Administered 2023-07-28: 4 [IU] via SUBCUTANEOUS
  Administered 2023-07-28: 7 [IU] via SUBCUTANEOUS
  Administered 2023-07-28: 15 [IU] via SUBCUTANEOUS
  Administered 2023-07-29 (×2): 7 [IU] via SUBCUTANEOUS
  Administered 2023-07-29: 15 [IU] via SUBCUTANEOUS
  Administered 2023-07-29: 3 [IU] via SUBCUTANEOUS
  Administered 2023-07-29: 20 [IU] via SUBCUTANEOUS
  Administered 2023-07-30: 7 [IU] via SUBCUTANEOUS
  Administered 2023-07-30: 11 [IU] via SUBCUTANEOUS
  Administered 2023-07-30 (×3): 7 [IU] via SUBCUTANEOUS
  Administered 2023-07-30: 3 [IU] via SUBCUTANEOUS
  Administered 2023-07-31: 7 [IU] via SUBCUTANEOUS
  Administered 2023-07-31: 4 [IU] via SUBCUTANEOUS
  Administered 2023-07-31: 7 [IU] via SUBCUTANEOUS
  Administered 2023-07-31 – 2023-08-01 (×3): 4 [IU] via SUBCUTANEOUS
  Administered 2023-08-01: 20 [IU] via SUBCUTANEOUS
  Administered 2023-08-01: 4 [IU] via SUBCUTANEOUS

## 2023-07-27 MED ORDER — RIVAROXABAN 10 MG PO TABS: 10.0000 mg | ORAL_TABLET | Freq: Every day | ORAL | Status: DC

## 2023-07-27 MED ORDER — PIPERACILLIN-TAZOBACTAM 3.375 G IVPB
3.3750 g | Freq: Three times a day (TID) | INTRAVENOUS | Status: AC
  Administered 2023-07-27 – 2023-08-01 (×15): 3.375 g via INTRAVENOUS
  Filled 2023-07-27 (×15): qty 50

## 2023-07-27 MED ORDER — BUDESONIDE 0.5 MG/2ML IN SUSP
0.5000 mg | Freq: Two times a day (BID) | RESPIRATORY_TRACT | Status: DC
  Administered 2023-07-27 – 2023-08-05 (×18): 0.5 mg via RESPIRATORY_TRACT
  Filled 2023-07-27 (×19): qty 2

## 2023-07-27 MED ORDER — METHYLPREDNISOLONE SODIUM SUCC 125 MG IJ SOLR
125.0000 mg | Freq: Once | INTRAMUSCULAR | Status: AC
  Administered 2023-07-27: 125 mg via INTRAVENOUS
  Filled 2023-07-27: qty 2

## 2023-07-27 MED ORDER — POLYETHYLENE GLYCOL 3350 17 G PO PACK: 17.0000 g | PACK | Freq: Every day | ORAL | Status: DC | PRN

## 2023-07-27 MED ORDER — IPRATROPIUM-ALBUTEROL 0.5-2.5 (3) MG/3ML IN SOLN
3.0000 mL | Freq: Four times a day (QID) | RESPIRATORY_TRACT | Status: DC | PRN
Start: 2023-07-27 — End: 2023-07-27

## 2023-07-27 MED ORDER — ALBUTEROL SULFATE (2.5 MG/3ML) 0.083% IN NEBU: 2.5000 mg | INHALATION_SOLUTION | RESPIRATORY_TRACT | Status: DC | PRN

## 2023-07-27 MED ORDER — ONDANSETRON HCL 4 MG/2ML IJ SOLN: 4.0000 mg | Freq: Four times a day (QID) | INTRAMUSCULAR | Status: DC | PRN

## 2023-07-27 MED ORDER — SODIUM CHLORIDE 0.9 % IV SOLN
2.0000 g | Freq: Once | INTRAVENOUS | Status: AC
  Administered 2023-07-27: 2 g via INTRAVENOUS
  Filled 2023-07-27: qty 12.5

## 2023-07-27 MED ORDER — ARFORMOTEROL TARTRATE 15 MCG/2ML IN NEBU
15.0000 ug | INHALATION_SOLUTION | Freq: Two times a day (BID) | RESPIRATORY_TRACT | Status: DC
  Administered 2023-07-27 – 2023-08-05 (×18): 15 ug via RESPIRATORY_TRACT
  Filled 2023-07-27 (×20): qty 2

## 2023-07-27 MED ORDER — APIXABAN 5 MG PO TABS
5.0000 mg | ORAL_TABLET | Freq: Two times a day (BID) | ORAL | Status: DC
  Administered 2023-07-27 – 2023-07-28 (×3): 5 mg via ORAL
  Filled 2023-07-27 (×3): qty 1

## 2023-07-27 MED ORDER — LACTATED RINGERS IV SOLN: INTRAVENOUS | Status: AC

## 2023-07-27 MED ORDER — DOCUSATE SODIUM 100 MG PO CAPS: 100.0000 mg | ORAL_CAPSULE | Freq: Two times a day (BID) | ORAL | Status: DC | PRN

## 2023-07-27 NOTE — Inpatient Diabetes Management (Signed)
Inpatient Diabetes Program Recommendations  AACE/ADA: New Consensus Statement on Inpatient Glycemic Control (2015)  Target Ranges:  Prepandial:   less than 140 mg/dL      Peak postprandial:   less than 180 mg/dL (1-2 hours)      Critically ill patients:  140 - 180 mg/dL   Lab Results  Component Value Date   GLUCAP 390 (H) 07/27/2023   HGBA1C 9.6 (A) 09/13/2022    Diabetes history: DM2 Outpatient Diabetes medications: Lantus 38 units daily, Humalog 10 units tid, Jardiance 10 mg daily Current orders for Inpatient glycemic control: None yet  Inpatient Diabetes Program Recommendations:   Current CBG 390, please consider: -Semglee 30 units now and daily -Humalog 5 units tid meal coverage if diet is ordered  Noted patient may not have insurance until 07/29/23. Will follow while hospitalized.  Thank you, [name]. Karla Matsuo, RN, MSN, CDE  Diabetes Coordinator Inpatient Glycemic Control Team Team Pager [phone] (8am-5pm) 07/27/2023 2:09 PM

## 2023-07-27 NOTE — Progress Notes (Addendum)
CONSULT NOTE    [name]   GNF:[id number] DOB: 05-30-1961   Date of Service: 07/27/23 Requesting physician/APP from ED: Treatment Team:  Attending Provider: Chilton Greathouse, MD  PCP: [name], MD    HPI:  [name] is a 62 y.o. female w/ PMH DM2 (recently told to reduce her insulin d/t upcoming procedure), following w/ podiatry and wound care for R foot ulcer, following w/ vascular surgery for PAD and planning for angiography w/ possible intervention to RLE anticipating stenting/arthrectomy, carotid stenosis s/p L CEA, CAD, chronic HFrEF, PAfib on eliquis, COPD  She presents to the ED today 07/27/23 via EMS w/ CC weakness, decreased UOP, dysuria, lethargy, SOB, hyperglycemia.    In ED, noted to be SOB/hypoxic and placed on BiPap started tx for COPD, w/u revealed more concern for urosepsis. She was weaned off BiPap according to EDP but clearly in respiratory distress on examination for admission. Fluids and abx given. ICU consulted. I spoke w/ sister who confirmed patient is full code.   Of note, scheduled for aortogram w/ likely LE intervention tomorrow w/ Dr Lenell Antu    Consultants:  ICU  Procedures: none      ASSESSMENT & PLAN:   Metabolic acidosis likely d/t AKI/ARF from UTI Respiratory compensation  Respiratory distress and altered mental status ICU asked to evaluate --> agree likely needs fluid resuscitation and should improve but high risk for intubation if worsening, decision was made to admit to ICU and can hopefully transition back to hospitalist service   UTI Urosepsis meeting parameters w/ WBC 25, tachypnea, tachycardia Acute Metabolic Encephalopathy d/t Urosepsis IV fluids Abx: per ICU Blood cultures pending Urine cultures pending  Hyperglycemia  DM2 recently told to reduce her insulin d/t upcoming procedure Home meds: Humalog tid, Lantus daily 37 units, jardiance 10 mg daily Basal + SSI here   PAD Following w/ vascular  Will  send message to alert she has been admitted   HTN HFrEF HLD Paroxysmal Afib Home meds: Eliquis, Hydralazine, Losartan, Torsemide, spironolactone, Jardiance, Crestor - per ICU Not on rate control   COPD Acute hypoxic resp fail  Question exacerbation Home meds: Albuterol, Symbicort Steroids ordered,  GERD  PPI  Hypothyroid Continue home synthroid  Will not check TSH in acute illness   Gout, not active flare  Monitor              Review of Systems:  Review of Systems  Respiratory:  Positive for shortness of breath.   ROS difficult to obtain d/t patient condition, she was a bit more interactive w/ ICU, she just kept repeating to me "I just feel bad" but would not elaborate      has a past medical history of Alcohol abuse, Alcoholic cirrhosis ([name]), Anemia, Anxiety, Arthritis, B12 deficiency, CAD (coronary artery disease), Cardiomyopathy ([name]), Carotid artery disease ([name]), Cellulitis, CHF (congestive heart failure) ([name]), Chronic combined systolic and diastolic CHF (congestive heart failure) ([name]), CKD (chronic kidney disease), stage III ([name]), Cocaine abuse ([name]), COPD (chronic obstructive pulmonary disease) ([name]), Critical lower limb ischemia ([name]), Depression, Diabetes mellitus without complication ([name]), Diabetic peripheral neuropathy ([name]), DVT (deep venous thrombosis) ([name]), Dyspnea, Elevated troponin, GERD (gastroesophageal reflux disease), Hyperlipemia, Hypertension, Hypokalemia, Hypomagnesemia, Hypothyroidism, Marijuana abuse, Narcotic abuse ([name]), Noncompliance, NSVT (nonsustained ventricular tachycardia) ([name]), Obesity, PAF (paroxysmal atrial fibrillation) ([name]), Paroxysmal atrial tachycardia, Peripheral arterial disease ([name]), Pneumonia, Poorly controlled type 2 diabetes mellitus ([name]), Renal disorder, Renal insufficiency, Sleep apnea, Symptomatic bradycardia, and Tobacco abuse.  No current  facility-administered medications on file prior to encounter.   Current  Outpatient Medications on File Prior to Encounter  Medication Sig Dispense Refill   ACCU-CHEK GUIDE test strip USE TO CHECK BLOOD SUGAR FOUR TIMES DAILY     acetaminophen (TYLENOL) 500 MG tablet Take 1,000 mg by mouth every 6 (six) hours as needed for headache (pain).     albuterol (VENTOLIN HFA) 108 (90 Base) MCG/ACT inhaler USE 2 PUFFS BY MOUTH EVERY FOUR HOURS, AS NEEDED, FOR COUGHING/WHEEZING 8.5 g 0   apixaban (ELIQUIS) 5 MG TABS tablet Take 1 tablet (5 mg total) by mouth 2 (two) times daily. 60 tablet 6   budesonide-formoterol (SYMBICORT) 160-4.5 MCG/ACT inhaler Inhale 2 puffs into the lungs 2 (two) times daily. 1 each 2   buPROPion ER (WELLBUTRIN SR) 100 MG 12 hr tablet Take 1 tablet (100 mg total) by mouth daily. 30 tablet 6   Cholecalciferol (VITAMIN D3 PO) Take 1 capsule by mouth every morning.     colchicine 0.6 MG tablet Take 1.2 mg by mouth See admin instructions. Take 1.2 mg for flair up and an hour later take 0.6 mg if needed     Continuous Blood Gluc Sensor (FREESTYLE LIBRE 2 SENSOR) MISC USE 1 (ONE) EACH EVERY 2 WEEKS (Patient not taking: Reported on 07/05/2023) 2 each 11   EASY COMFORT PEN NEEDLES 31G X 5 MM MISC USE FOUR TIMES A DAY FOR INSULIN ADMINISTRATION 100 each 1   hydrALAZINE (APRESOLINE) 50 MG tablet Take 1 tablet (50 mg total) by mouth 2 (two) times daily. 60 tablet 6   insulin lispro (HUMALOG) 100 UNIT/ML KwikPen Inject 10 Units into the skin 3 (three) times daily with meals. 15 mL 0   JARDIANCE 10 MG TABS tablet TAKE 1 TABLET (10 MG TOTAL) BY MOUTH DAILY BEFORE BREAKFAST (AM) 90 tablet 3   LANTUS SOLOSTAR 100 UNIT/ML Solostar Pen Inject 37 Units into the skin daily. (Patient taking differently: Inject 38 Units into the skin daily.) 15 mL 0   levothyroxine (SYNTHROID) 50 MCG tablet TAKE 1 TABLET (50 MCG TOTAL) BY MOUTH DAILY BEFORE BREAKFAST (AM) 30 tablet 0   lidocaine (LIDODERM) 5 % Place 1 patch onto the skin daily. Remove & Discard patch within 12 hours or as  directed by MD (Patient not taking: Reported on 07/14/2023) 30 patch 0   losartan (COZAAR) 25 MG tablet TAKE 2 TABLETS (50 MG TOTAL) BY MOUTH DAILY (AM) 60 tablet 0   Omega-3 Fatty Acids (FISH OIL PO) Take 1 capsule by mouth every morning.     ondansetron (ZOFRAN-ODT) 8 MG disintegrating tablet Take 1 tablet (8 mg total) by mouth every 8 (eight) hours as needed for nausea or vomiting. 20 tablet 0   OVER THE COUNTER MEDICATION Take 2 tablets by mouth daily. Total beets chew     pantoprazole (PROTONIX) 40 MG tablet TAKE 1 TABLET (40 MG TOTAL) BY MOUTH DAILY(AM) 30 tablet 0   potassium chloride SA (KLOR-CON M) 20 MEQ tablet Take 1 tablet (20 mEq total) by mouth 2 (two) times daily. 180 tablet 3   rosuvastatin (CRESTOR) 40 MG tablet Take 1 tablet (40 mg total) by mouth daily. 90 tablet 3   spironolactone (ALDACTONE) 25 MG tablet TAKE 0.5 TABLET (12.5 MG TOTAL) BY MOUTH DAILY (AM) 45 tablet 3   torsemide (DEMADEX) 100 MG tablet TAKE 1 TABLET (100 MG TOTAL) BY MOUTH 2 (TWO) TIMES DAILY. (AM+BEDTIME) 60 tablet 3   triamcinolone ointment (KENALOG) 0.1 % MIX 1:1 WITH CERA-VE (  OTC) AND APPLY TOPICALLY 2 (TWO) TIMES DAILY AS NEEDED. 454 g 0   VITAMIN E PO Take 1 tablet by mouth daily.     [DISCONTINUED] tiotropium (SPIRIVA HANDIHALER) 18 MCG inhalation capsule Place 1 capsule (18 mcg total) into inhaler and inhale every morning. (Patient not taking: Reported on 02/09/2021) 30 capsule 2     Allergies  Allergen Reactions   Gabapentin Nausea And Vomiting and Other (See Comments)    POSSIBLE SHAKING   Lyrica [Pregabalin] Other (See Comments)    Shaking       family history includes Breast cancer in her mother; Cancer in her mother; Clotting disorder in her mother; Diabetes in her mother; Emphysema in her sister; Heart attack in her mother; Heart disease in her father and mother; Hypertension in her father and mother. Past Surgical History:  Procedure Laterality Date   ABDOMINAL AORTOGRAM N/A 06/26/2019    Procedure: ABDOMINAL AORTOGRAM;  Surgeon: Iran Ouch, MD;  Location: [facility] IN[facility]IVE [facility];  Service: Cardiovascular;  Laterality: N/A;   ABDOMINAL AORTOGRAM W/LOWER EXTREMITY N/A 06/07/2021   Procedure: ABDOMINAL AORTOGRAM W/LOWER EXTREMITY;  Surgeon: Runell Gess, MD;  Location: [facility];  Service: Cardiovascular;  Laterality: N/A;   AMPUTATION Right 06/14/2017   Procedure: Right foot transmetatarsal amputation;  Surgeon: Nadara Mustard, MD;  Location: [facility] OR;  Service: Orthopedics;  Laterality: Right;   AMPUTATION Left 06/28/2019   Procedure: AMPUTATION LEFT FIFTH TOE;  Surgeon: Larina Earthly, MD;  Location: [facility] OR;  Service: Vascular;  Laterality: Left;   AMPUTATION TOE Right 04/28/2017   Procedure: AMPUTATION OF RIGHT SECOND RAY;  Surgeon: Nadara Mustard, MD;  Location: [facility] OR;  Service: Orthopedics;  Laterality: Right;   CARDIAC CATHETERIZATION     CARDIAC CATHETERIZATION N/A 01/12/2016   Procedure: Temporary Wire;  Surgeon: Rollene Rotunda, MD;  Location: [facility];  Service: Cardiovascular;  Laterality: N/A;   CARDIOVERSION  ~ 02/2013   "twice"    CAROTID ANGIOGRAM N/A 01/15/2015   Procedure: CAROTID ANGIOGRAM;  Surgeon: Runell Gess, MD;  Location: [facility] CATH LAB;  Service: Cardiovascular;  Laterality: N/A;   DILATION AND CURETTAGE OF UTERUS  1988   ENDARTERECTOMY Left 02/19/2015   Procedure: LEFT CAROTID ENDARTERECTOMY ;  Surgeon: Nada Libman, MD;  Location: [facility] OR;  Service: Vascular;  Laterality: Left;   FEMORAL-TIBIAL BYPASS GRAFT Left 06/28/2019   Procedure: BYPASS GRAFT LEFT LEG FEMORAL TO ANTERIOR TIBIAL ARTERY using LEFT GREATER SAPHENOUS VEIN;  Surgeon: Larina Earthly, MD;  Location: [facility] OR;  Service: Vascular;  Laterality: Left;   LEFT HEART CATHETERIZATION WITH CORONARY ANGIOGRAM N/A 10/31/2014   Procedure: LEFT HEART CATHETERIZATION WITH CORONARY ANGIOGRAM;  Surgeon: Kathleene Hazel, MD;  Location: [facility] CATH LAB;  Service: Cardiovascular;  Laterality:  N/A;   LOWER EXTREMITY ANGIOGRAM N/A 09/10/2013   Procedure: LOWER EXTREMITY ANGIOGRAM;  Surgeon: Runell Gess, MD;  Location: [facility] CATH LAB;  Service: Cardiovascular;  Laterality: N/A;   LOWER EXTREMITY ANGIOGRAM N/A 01/15/2015   Procedure: LOWER EXTREMITY ANGIOGRAM;  Surgeon: Runell Gess, MD;  Location: [facility] CATH LAB;  Service: Cardiovascular;  Laterality: N/A;   LOWER EXTREMITY ANGIOGRAPHY N/A 04/13/2017   Procedure: Lower Extremity Angiography;  Surgeon: Runell Gess, MD;  Location: [facility] INVASIVE CV LAB;  Service: Cardiovascular;  Laterality: N/A;   LOWER EXTREMITY ANGIOGRAPHY Left 06/26/2019   Procedure: LOWER EXTREMITY ANGIOGRAPHY;  Surgeon: Iran Ouch, MD;  Location: [facility];  Service:  Cardiovascular;  Laterality: Left;   PERIPHERAL VASCULAR ATHERECTOMY Right 06/07/2021   Procedure: PERIPHERAL VASCULAR ATHERECTOMY;  Surgeon: Runell Gess, MD;  Location: [facility] INVASIVE CV LAB;  Service: Cardiovascular;  Laterality: Right;   PERIPHERAL VASCULAR BALLOON ANGIOPLASTY Left 06/26/2019   Procedure: PERIPHERAL VASCULAR BALLOON ANGIOPLASTY;  Surgeon: Iran Ouch, MD;  Location: [facility];  Service: Cardiovascular;  Laterality: Left;  unable to cross lt sfa occlusion   PERIPHERAL VASCULAR INTERVENTION  04/13/2017   Procedure: Peripheral Vascular Intervention;  Surgeon: Runell Gess, MD;  Location: [facility] INVASIVE CV LAB;  Service: Cardiovascular;;   PRESSURE SENSOR/CARDIOMEMS N/A 02/05/2020   Procedure: PRESSURE SENSOR/CARDIOMEMS;  Surgeon: Laurey Morale, MD;  Location: [facility] INVASIVE CV LAB;  Service: Cardiovascular;  Laterality: N/A;   VEIN HARVEST Left 06/28/2019   Procedure: LEFT LEG GREATER SAPHENOUS VEIN HARVEST;  Surgeon: Larina Earthly, MD;  Location: [facility] OR;  Service: Vascular;  Laterality: Left;          Objective Findings:  Vitals:   07/27/23 1315 07/27/23 1415 07/27/23 1443 07/27/23 1745  BP: (!) 179/71 (!) 158/78 (!) 179/71 (!) 165/79  Pulse:  (!) 103 (!) 105 (!) 113 (!) 102  Resp: (!) 32 (!) 32 (!) 32 19  Temp:   99 F (37.2 C) (!) 100.6 F (38.1 C)  TempSrc:   Oral Oral  SpO2: 100% 100% 98% 100%  Weight:      Height:       No intake or output data in the 24 hours ending 07/27/23 1830 Filed Weights   07/27/23 1200  Weight: 95.3 kg    Examination:  Physical Exam Constitutional:      General: She is in acute distress.     Appearance: She is ill-appearing and toxic-appearing.  Cardiovascular:     Rate and Rhythm: Regular rhythm. Tachycardia present.  Pulmonary:     Effort: Respiratory distress present.     Breath sounds: Wheezing present.  Abdominal:     General: Abdomen is flat.     Palpations: Abdomen is soft.  Musculoskeletal:     Right lower leg: Edema (trace) present.     Left lower leg: Edema (trace) present.  Skin:    General: Skin is warm and dry.     Comments: Skin on LE bilateral feels very warm, some erythema on RLE, wound on plantar aspect of foot was examined, not erythematous, normal granulation tissue present   Neurological:     Mental Status: She is lethargic and disoriented.  Psychiatric:        Behavior: Behavior is cooperative.          Scheduled Medications:   apixaban  5 mg Oral BID   arformoterol  15 mcg Nebulization BID   budesonide (PULMICORT) nebulizer solution  0.5 mg Nebulization BID   insulin aspart  0-20 Units Subcutaneous Q4H   revefenacin  175 mcg Nebulization Daily    Continuous Infusions:  lactated ringers 150 mL/hr at 07/27/23 1633   piperacillin-tazobactam (ZOSYN)  IV      PRN Medications:  albuterol, docusate sodium, ondansetron (ZOFRAN) IV, polyethylene glycol  Antimicrobials:  Anti-infectives (From admission, onward)    Start     Dose/Rate Route Frequency Ordered Stop   07/27/23 1900  piperacillin-tazobactam (ZOSYN) IVPB 3.375 g        3.375 g 12.5 mL/hr over 240 Minutes Intravenous Every 8 hours 07/27/23 1809     07/27/23 1245  ceFEPIme (MAXIPIME) 2  g in sodium chloride  0.9 % 100 mL IVPB        2 g 200 mL/hr over 30 Minutes Intravenous  Once 07/27/23 1240 07/27/23 1505   07/27/23 1245  vancomycin (VANCOCIN) IVPB 1000 mg/200 mL premix        1,000 mg 200 mL/hr over 60 Minutes Intravenous  Once 07/27/23 1240 07/27/23 1414           Data Reviewed: I have personally reviewed following labs and imaging studies  CBC: Recent Labs  Lab 07/27/23 1039 07/27/23 1059 07/27/23 1417  WBC 24.1*  --   --   HGB 10.2* 11.2* 10.9*  HCT 34.1* 33.0* 32.0*  MCV 93.4  --   --   PLT 271  --   --    Basic Metabolic Panel: Recent Labs  Lab 07/27/23 1039 07/27/23 1059 07/27/23 1417  NA 133* 134* 137  K 5.0 5.2* 4.9  CL 102  --   --   CO2 17*  --   --   GLUCOSE 340*  --   --   BUN 82*  --   --   CREATININE 2.68*  --   --   CALCIUM 8.5*  --   --   MG 2.3  --   --    GFR: Estimated Creatinine Clearance: 25 mL/min (A) (by C-G formula based on SCr of 2.68 mg/dL (H)). Liver Function Tests: Recent Labs  Lab 07/27/23 1039  AST 39  ALT 30  ALKPHOS 172*  BILITOT 0.4  PROT 7.1  ALBUMIN 3.1*   No results for input(s): "LIPASE", "AMYLASE" in the last 168 hours. No results for input(s): "AMMONIA" in the last 168 hours. Coagulation Profile: No results for input(s): "INR", "PROTIME" in the last 168 hours. Cardiac Enzymes: No results for input(s): "CKTOTAL", "CKMB", "CKMBINDEX", "TROPONINI" in the last 168 hours. BNP (last 3 results) No results for input(s): "PROBNP" in the last 8760 hours. HbA1C: No results for input(s): "HGBA1C" in the last 72 hours. CBG: Recent Labs  Lab 07/27/23 1008  GLUCAP 390*   Lipid Profile: No results for input(s): "CHOL", "HDL", "LDLCALC", "TRIG", "CHOLHDL", "LDLDIRECT" in the last 72 hours. Thyroid Function Tests: Recent Labs    07/27/23 1710  TSH 1.038   Anemia Panel: No results for input(s): "VITAMINB12", "FOLATE", "FERRITIN", "TIBC", "IRON", "RETICCTPCT" in the last 72 hours. Most Recent  Urinalysis On File:     Component Value Date/Time   COLORURINE AMBER (A) 07/27/2023 1449   APPEARANCEUR CLOUDY (A) 07/27/2023 1449   LABSPEC 1.016 07/27/2023 1449   PHURINE 5.0 07/27/2023 1449   GLUCOSEU 50 (A) 07/27/2023 1449   HGBUR MODERATE (A) 07/27/2023 1449   BILIRUBINUR NEGATIVE 07/27/2023 1449   KETONESUR 5 (A) 07/27/2023 1449   PROTEINUR 30 (A) 07/27/2023 1449   UROBILINOGEN 0.2 09/05/2015 0012   NITRITE POSITIVE (A) 07/27/2023 1449   LEUKOCYTESUR MODERATE (A) 07/27/2023 1449   Sepsis Labs: @LABRCNTIP (procalcitonin:4,lacticidven:4)  No results found for this or any previous visit (from the past 240 hour(s)).       Radiology Studies: DG Chest Portable 1 View  Result Date: 07/27/2023 CLINICAL DATA:  Hyperglycemia.  Congestive heart failure. EXAM: PORTABLE CHEST 1 VIEW COMPARISON:  None Available. FINDINGS: The heart size and mediastinal contours are within normal limits. Hypoinflation of the lungs. Both lungs are clear. The visualized skeletal structures are unremarkable. IMPRESSION: No active disease.  Hypoinflation of the lungs. Electronically Signed   By: Lupita Raider M.D.   On: 07/27/2023 12:05  LOS: 0 days    Time spent: 45 min    Sunnie Nielsen, DO [facility] 07/27/2023, 6:30 PM    Dictation software may have been used to generate the above note. Typos may occur and escape review in typed/dictated notes. Please contact Dr Lyn Hollingshead directly for clarity if needed.  Staff may message me via secure chat in Epic  but this may not receive an immediate response,  please page me for urgent matters!  If 7PM-7AM, please contact night coverage [email or web address]

## 2023-07-27 NOTE — Progress Notes (Signed)
eLink Physician-Brief Progress Note Patient Name: [name] DOB: February 15, 1961 MRN: [id number]   Date of Service  07/27/2023  HPI/Events of Note  Patient with advanced COPD admitted with sepsis secondary to a suspected UTI, patient has been encephalopathic as well.  eICU Interventions          Migdalia Dk 07/27/2023, 7:15 PM

## 2023-07-27 NOTE — Progress Notes (Signed)
ED Pharmacy Antibiotic Sign Off An antibiotic consult was received from an ED provider for sepsis per pharmacy dosing for cefepime and vancomcyin. A chart review was completed to assess appropriateness.   The following one time order(s) were placed:  Cefepime 2G IV x1 Vancomycin 1G IV x1  Further antibiotic and/or antibiotic pharmacy consults should be ordered by the admitting provider if indicated.   Thank you for allowing pharmacy to be a part of this patient's care.   Smitty Cords, [facility]  Clinical Pharmacist 07/27/23 12:54 PM

## 2023-07-27 NOTE — ED Provider Notes (Signed)
[place] EMERGENCY DEPARTMENT AT [facility] Provider Note   CSN: [id number] Arrival date & time: 07/27/23  1000     History  Chief Complaint  Patient presents with   Hyperglycemia    [name] is a 62 y.o. female.  With a past medical history of insulin-dependent diabetes, CHF and COPD who presents to the ED with a chief complaint of hyperglycemia.  She is scheduled to have a right lower extremity vascular stent placed tomorrow after DVT was found.  She reports that she was instructed to decrease her insulin prior to this operation.  Her blood glucose has been poorly controlled at home.  She also reports shortness of breath this morning.  Denies chest pain, fevers, chills and recent illness.   Hyperglycemia Associated symptoms: shortness of breath        Home Medications Prior to Admission medications   Medication Sig Start Date End Date Taking? Authorizing Provider  ACCU-CHEK GUIDE test strip USE TO CHECK BLOOD SUGAR FOUR TIMES DAILY 02/25/20   [provider]  acetaminophen (TYLENOL) 500 MG tablet Take 1,000 mg by mouth every 6 (six) hours as needed for headache (pain).    [provider]  albuterol (VENTOLIN HFA) 108 (90 Base) MCG/ACT inhaler USE 2 PUFFS BY MOUTH EVERY FOUR HOURS, AS NEEDED, FOR COUGHING/WHEEZING 07/12/23   [name], MD  apixaban (ELIQUIS) 5 MG TABS tablet Take 1 tablet (5 mg total) by mouth 2 (two) times daily. 07/05/23   Clegg, [name] D, NP  budesonide-formoterol (SYMBICORT) 160-4.5 MCG/ACT inhaler Inhale 2 puffs into the lungs 2 (two) times daily. 07/12/23   [name], MD  buPROPion ER (WELLBUTRIN SR) 100 MG 12 hr tablet Take 1 tablet (100 mg total) by mouth daily. 07/05/23   Clegg, [name] D, NP  Cholecalciferol (VITAMIN D3 PO) Take 1 capsule by mouth every morning.    [provider]  colchicine 0.6 MG tablet Take 1.2 mg by mouth See admin instructions. Take 1.2 mg for flair up and an hour later take 0.6 mg if  needed    [provider]  Continuous Blood Gluc Sensor (FREESTYLE LIBRE 2 SENSOR) MISC USE 1 (ONE) EACH EVERY 2 WEEKS Patient not taking: Reported on 07/05/2023 07/14/21   [name], MD  EASY COMFORT PEN NEEDLES 31G X 5 MM MISC USE FOUR TIMES A DAY FOR INSULIN ADMINISTRATION 08/30/22   Anders Simmonds, PA-C  hydrALAZINE (APRESOLINE) 50 MG tablet Take 1 tablet (50 mg total) by mouth 2 (two) times daily. 07/05/23   Clegg, [name] D, NP  insulin lispro (HUMALOG) 100 UNIT/ML KwikPen Inject 10 Units into the skin 3 (three) times daily with meals. 04/05/23   [name], MD  JARDIANCE 10 MG TABS tablet TAKE 1 TABLET (10 MG TOTAL) BY MOUTH DAILY BEFORE BREAKFAST (AM) 03/30/23   Milford, Anderson Malta, FNP  LANTUS SOLOSTAR 100 UNIT/ML Solostar Pen Inject 37 Units into the skin daily. Patient taking differently: Inject 38 Units into the skin daily. 04/05/23   [name], MD  levothyroxine (SYNTHROID) 50 MCG tablet TAKE 1 TABLET (50 MCG TOTAL) BY MOUTH DAILY BEFORE BREAKFAST (AM) 05/02/23   [name], MD  lidocaine (LIDODERM) 5 % Place 1 patch onto the skin daily. Remove & Discard patch within 12 hours or as directed by MD Patient not taking: Reported on 07/14/2023 03/20/23   Virgina Norfolk, DO  losartan (COZAAR) 25 MG tablet TAKE 2 TABLETS (50 MG TOTAL) BY MOUTH DAILY (AM) 05/02/23  [name], MD  Omega-3 Fatty Acids (FISH OIL PO) Take 1 capsule by mouth every morning.    [provider]  ondansetron (ZOFRAN-ODT) 8 MG disintegrating tablet Take 1 tablet (8 mg total) by mouth every 8 (eight) hours as needed for nausea or vomiting. 04/14/23   Garrison, [place] N, FNP  OVER THE COUNTER MEDICATION Take 2 tablets by mouth daily. Total beets chew    [provider]  pantoprazole (PROTONIX) 40 MG tablet TAKE 1 TABLET (40 MG TOTAL) BY MOUTH DAILY(AM) 05/02/23   [name], MD  potassium chloride SA (KLOR-CON M) 20 MEQ tablet Take 1 tablet (20 mEq total) by mouth 2 (two)  times daily. 07/21/23   Laurey Morale, MD  rosuvastatin (CRESTOR) 40 MG tablet Take 1 tablet (40 mg total) by mouth daily. 09/07/22   Cannon Kettle, PA-C  spironolactone (ALDACTONE) 25 MG tablet TAKE 0.5 TABLET (12.5 MG TOTAL) BY MOUTH DAILY (AM) 10/11/22   Laurey Morale, MD  torsemide (DEMADEX) 100 MG tablet TAKE 1 TABLET (100 MG TOTAL) BY MOUTH 2 (TWO) TIMES DAILY. (AM+BEDTIME) 12/29/22   Laurey Morale, MD  triamcinolone ointment (KENALOG) 0.1 % MIX 1:1 WITH CERA-VE (OTC) AND APPLY TOPICALLY 2 (TWO) TIMES DAILY AS NEEDED. 04/13/23   [name], MD  VITAMIN E PO Take 1 tablet by mouth daily.    [provider]  tiotropium (SPIRIVA HANDIHALER) 18 MCG inhalation capsule Place 1 capsule (18 mcg total) into inhaler and inhale every morning. Patient not taking: Reported on 02/09/2021 12/09/19 02/09/21  [name], MD      Allergies    Gabapentin and Lyrica [pregabalin]    Review of Systems   Review of Systems  Respiratory:  Positive for shortness of breath.   All other systems reviewed and are negative.   Physical Exam Updated Vital Signs BP (!) 179/71 (BP Location: Left Arm)   Pulse (!) 113   Temp 99 F (37.2 C) (Oral)   Resp (!) 32   Ht 5\' 4"  (1.626 m)   Wt 95.3 kg   LMP  (LMP Unknown)   SpO2 98%   BMI 36.05 kg/m  Physical Exam Vitals and nursing note reviewed.  HENT:     Head: Normocephalic and atraumatic.  Eyes:     Pupils: Pupils are equal, round, and reactive to light.  Cardiovascular:     Rate and Rhythm: Normal rate and regular rhythm.  Pulmonary:     Breath sounds: Wheezing present. No rales.     Comments: Tachypnea with increased accessory muscle use Abdominal:     Palpations: Abdomen is soft.     Tenderness: There is no abdominal tenderness. There is no guarding or rebound.  Skin:    General: Skin is warm and dry.  Neurological:     General: No focal deficit present.     Mental Status: She is alert and oriented to person, place,  and time.  Psychiatric:        Mood and Affect: Mood normal.     ED Results / Procedures / Treatments   Labs (all labs ordered are listed, but only abnormal results are displayed) Labs Reviewed  CBC - Abnormal; Notable for the following components:      Result Value   WBC 24.1 (*)    RBC 3.65 (*)    Hemoglobin 10.2 (*)    HCT 34.1 (*)    MCHC 29.9 (*)    All other components within normal limits  COMPREHENSIVE METABOLIC PANEL - Abnormal; Notable for the following components:   Sodium 133 (*)    CO2 17 (*)    Glucose, Bld 340 (*)    BUN 82 (*)    Creatinine, Ser 2.68 (*)    Calcium 8.5 (*)    Albumin 3.1 (*)    Alkaline Phosphatase 172 (*)    GFR, Estimated 20 (*)    All other components within normal limits  URINALYSIS, ROUTINE W REFLEX MICROSCOPIC - Abnormal; Notable for the following components:   Color, Urine AMBER (*)    APPearance CLOUDY (*)    Glucose, UA 50 (*)    Hgb urine dipstick MODERATE (*)    Ketones, ur 5 (*)    Protein, ur 30 (*)    Nitrite POSITIVE (*)    Leukocytes,Ua MODERATE (*)    Bacteria, UA MANY (*)    All other components within normal limits  CBG MONITORING, ED - Abnormal; Notable for the following components:   Glucose-Capillary 390 (*)    All other components within normal limits  I-STAT VENOUS BLOOD GAS, ED - Abnormal; Notable for the following components:   pCO2, Ven 27.9 (*)    pO2, Ven 70 (*)    Bicarbonate 16.2 (*)    TCO2 17 (*)    Acid-base deficit 8.0 (*)    Sodium 134 (*)    Potassium 5.2 (*)    Calcium, Ion 0.98 (*)    HCT 33.0 (*)    Hemoglobin 11.2 (*)    All other components within normal limits  CULTURE, BLOOD (ROUTINE X 2)  CULTURE, BLOOD (ROUTINE X 2)  MAGNESIUM  BETA-HYDROXYBUTYRIC ACID  BETA-HYDROXYBUTYRIC ACID  BETA-HYDROXYBUTYRIC ACID  I-STAT CG4 LACTIC ACID, ED  I-STAT VENOUS BLOOD GAS, ED  I-STAT CG4 LACTIC ACID, ED       EKG None  Radiology DG Chest Portable 1 View  Result Date:  07/27/2023 CLINICAL DATA:  Hyperglycemia.  Congestive heart failure. EXAM: PORTABLE CHEST 1 VIEW COMPARISON:  None Available. FINDINGS: The heart size and mediastinal contours are within normal limits. Hypoinflation of the lungs. Both lungs are clear. The visualized skeletal structures are unremarkable. IMPRESSION: No active disease.  Hypoinflation of the lungs. Electronically Signed   By: Lupita Raider M.D.   On: 07/27/2023 12:05    Procedures .Critical Care  Performed by: Royanne Foots, DO Authorized by: Royanne Foots, DO   Critical care provider statement:    Critical care time (minutes):  30   Critical care was necessary to treat or prevent imminent or life-threatening deterioration of the following conditions:  Respiratory failure   Critical care was time spent personally by me on the following activities:  Development of treatment plan with patient or surrogate, discussions with consultants, evaluation of patient's response to treatment, examination of patient, ordering and review of laboratory studies, ordering and review of radiographic studies, ordering and performing treatments and interventions, pulse oximetry, re-evaluation of patient's condition and review of old charts   I assumed direction of critical care for this patient from another provider in my specialty: no     Care discussed with: admitting provider   Ultrasound ED Thoracic  Date/Time: 07/27/2023 11:21 AM  Performed by: Royanne Foots, DO Authorized by: Royanne Foots, DO   Procedure details:    Indications: dyspnea     Assessment for:  Pleural effusion   Left lung pleural:  Visualized   Right lung pleural:  Visualized   Images: archived  Findings:    A-lines noted throughout: not identified     B-lines noted throughout: not identified   Right Lung Findings:     right lung sliding    no right lung consolidation    no right lung pleural effusion  Left Lung Findings:     left lung sliding    no left  lung consolidation    no left lung pleural effusion Impression:    Impression: none   Ultrasound ED Echo  Date/Time: 07/27/2023 11:22 AM  Performed by: Royanne Foots, DO Authorized by: Royanne Foots, DO   Procedure details:    Indications: dyspnea     Views: subxiphoid, parasternal long axis view, parasternal short axis view, apical 4 chamber view and IVC view     Images: archived   Findings:    Pericardium: no pericardial effusion     LV Function: depressed (30 - 50%)     RV Diameter: normal     IVC: normal   Impression:    Impression: normal and decreased contractility       Medications Ordered in ED Medications  lactated ringers infusion (has no administration in time range)  methylPREDNISolone sodium succinate (SOLU-MEDROL) 125 mg/2 mL injection 125 mg (has no administration in time range)  ipratropium-albuterol (DUONEB) 0.5-2.5 (3) MG/3ML nebulizer solution 3 mL (3 mLs Nebulization Given 07/27/23 1042)  lactated ringers bolus 1,000 mL (1,000 mLs Intravenous Bolus 07/27/23 1200)  ceFEPIme (MAXIPIME) 2 g in sodium chloride 0.9 % 100 mL IVPB (0 g Intravenous Stopped 07/27/23 1505)  vancomycin (VANCOCIN) IVPB 1000 mg/200 mL premix (0 mg Intravenous Stopped 07/27/23 1414)    ED Course/ Medical Decision Making/ A&P Clinical Course as of 07/27/23 1615  Thu Jul 27, 2023  1235 Beta-Hydroxybutyric Acid: 0.07 [MP]  1505 I-Stat venous blood gas, ([facility], [facility], [facility]) [MP]  1602 No evidence of DKA on labs.  Based on white count tachycardia tachypnea patient meeting SIRS criteria.  Broad-spectrum coverage with cefepime and vancomycin given unknown source.  After additional workup chest x-ray shows no focal consolidations that would be consistent with pneumonia.  Urinalysis shows positive nitrites and leukocytes consistent with urinary source.  Venous lactate is less than 2.  Patient received IV fluid bolus with lactated Ringer's and has remained hemodynamically stable.  Respiratory status  has improved after short trial of BiPAP, Solu-Medrol and DuoNeb treatment.  Stable on 2 L nasal cannula.  Patient will be admitted to medicine for additional management of UTI and COPD exacerbation [MP]    Clinical Course User Index [MP] Royanne Foots, DO                                 Medical Decision Making 62 year old female with history as above presenting for hyperglycemia and respiratory difficulty.  She is scheduled to have a stent placed in right lower extremity secondary to DVT tomorrow.  Has been decreasing her insulin dose at home.  Upon my initial exam she is tachypneic with wheezing bilaterally.  No evidence of pulmonary edema or effusion on bedside ultrasound.  Presentation most concerning for DKA versus COPD exacerbation versus heart failure exacerbation versus pulmonary embolism.  Suspect most likely etiology would be COPD exacerbation.  Will treat with DuoNeb treatment and steroids and start on BiPAP and reassess.  if respirations fail to improve could consider CT of the chest to evaluate for pulmonary embolism.  Will obtain laboratory workup  to evaluate for DKA  Amount and/or Complexity of Data Reviewed Labs: ordered. Decision-making details documented in ED Course. Radiology: ordered.  Risk Prescription drug management. Decision regarding hospitalization.           Final Clinical Impression(s) / ED Diagnoses Final diagnoses:  Urinary tract infection without hematuria, site unspecified  COPD exacerbation ([name])  Sepsis, due to unspecified organism, unspecified whether acute organ dysfunction present [facility])    Rx / DC Orders ED Discharge Orders     None         Royanne Foots, DO 07/27/23 1615

## 2023-07-27 NOTE — Progress Notes (Signed)
Pharmacy Antibiotic Note  [name] is a 62 y.o. female admitted on 07/27/2023  presenting with AMS, concern for UTI.  Pharmacy has been consulted for zosyn dosing.  Plan: Zosyn 3.375g IV every 8 hours (extended 4h infusion) Monitor renal function, Cx and clinical progression to narrow  Height: 5\' 4"  (162.6 cm) Weight: 95.3 kg (210 lb) IBW/kg (Calculated) : 54.7  Temp (24hrs), Avg:99.5 F (37.5 C), Min:99 F (37.2 C), Max:100.6 F (38.1 C)  Recent Labs  Lab 07/27/23 1039 07/27/23 1418  WBC 24.1*  --   CREATININE 2.68*  --   LATICACIDVEN  --  1.4    Estimated Creatinine Clearance: 25 mL/min (A) (by C-G formula based on SCr of 2.68 mg/dL (H)).    Allergies  Allergen Reactions   Gabapentin Nausea And Vomiting and Other (See Comments)    POSSIBLE SHAKING   Lyrica [Pregabalin] Other (See Comments)    Shaking     Daylene Posey, PharmD, [facility] Clinical Pharmacist ED Pharmacist Phone # [phone] 07/27/2023 6:07 PM

## 2023-07-27 NOTE — H&P (Addendum)
NAME:  [name], MRN:  [id number], DOB:  September 14, 1961, LOS: 0 ADMISSION DATE:  07/27/2023, CONSULTATION DATE:  07/27/23 REFERRING MD:  Lyn Hollingshead - TR[facility] , CHIEF COMPLAINT:  AMS    History of Present Illness:  62 yo F PMH afib on xarelto, IDDM, HTN, PAD s/p R TMA, Tobacco use, COPD, CAD, CKD III,  gout, hx systolic and diastolic HF, who presented to ED 9/12 w AMS. Workup concerning for UTI. Over course of stay in Ed, rcvd 1 L LR followed by mIVF, empiric abx. Apparently at one point some incr WOB so put on BiPAP.  Sounds like mentation continued to worsen, at one point she may have been obtunded. There was concern that pt may further decompensate. [facility] requested PCCM to eval for admission in this setting   No lactic acidosis, no shock.    Pertinent  Medical History  PVD CAD HTN Hypothyroidism IDDM w hyperglycemia  Afib on xarelto  Gout Systolic and diastolic HF  CKD III   Significant Hospital Events: Including procedures, antibiotic start and stop dates in addition to other pertinent events   9/12 ED, UA c/f UTI. Tachy, altered. Incr WOB. AKI on CKD. 1L LR then mIVF. BiPAP.   Interim History / Subjective:  Not on bipap   Objective   Blood pressure (!) 165/79, pulse (!) 102, temperature (!) 100.6 F (38.1 C), temperature source Oral, resp. rate 19, height 5\' 4"  (1.626 m), weight 95.3 kg, SpO2 100%.    Vent Mode: PSV;BIPAP FiO2 (%):  [40 %] 40 % PEEP:  [5 cmH20] 5 cmH20 Pressure Support:  [5 cmH20] 5 cmH20  No intake or output data in the 24 hours ending 07/27/23 1803 Filed Weights   07/27/23 1200  Weight: 95.3 kg    Examination: General: chronically and acutely ill F NAD  HENT: Dry mm anicteric sclera  Lungs: CTAb incr RR no accessory use on RA  Cardiovascular: tachycardic s1ss2 cap refill brisk  Abdomen: soft round. Suprapubic tenderness, bladder distension  Extremities: BLE edema R>L, R TMA R foot ulcer  Neuro: Lethargic AAOx3 following commands  GU: no foley    Resolved Hospital Problem list     Assessment & Plan:   Acute encephalopathy  Severe sepsis w end organ dysfunction  UTI Hypovolemia P -add'l LR bolus x2 then mIVF  -zosyn  -check PCT -follow Bcx, Ucx  -adv diet to clears  -minimze CNS depressing meds  -check TSH   Tobacco use COPD ?hx OSA -PRN BiPAP  -brovana budesonide yupelri PRN albuterol  -smoking cessation  -based on call from [facility] re obtundation + resp distress, I had asked to [facility] put in STAT CXR and ABG -- on my arrival clinical exam argues against this need, I have dc   IDDM with hyperglycemia  -Starting rSSI, expect will need basal as well. Just want to get some volume into her first    Afib on eliquis Hx systolic and diastolic HF  CAD HTN  P -eliquis  - PRNs pending her clinical course in the next few hours, but holding her diuretics for now   PAD -let [facility] know of her admission -- she was scheduled for abdominal aortogram tomorrow  -dont think we need a [facility] consult acutely   Aki on CKD III  NAGMA  Hyponatremia, mild  -needs IVF  -repeat BMP this evening, AM CMP   Hypothyroidsm  -checking TSH  -likely restart PO synthroid tomorrow   Hx etoh use Hx cirrhosis  -not starting  CIWA right now, consider pending clinical signs  -PRN LFTs   Anemia of chronic dz -AM CBC   Best Practice (right click and "Reselect all SmartList Selections" daily)   Diet/type: clear liquids DVT prophylaxis: DOAC GI prophylaxis: N/A Lines: N/A Foley:  N/A Code Status:  full code d/w pt sister  Last date of multidisciplinary goals of care discussion [-- ]  Labs   CBC: Recent Labs  Lab 07/27/23 1039 07/27/23 1059 07/27/23 1417  WBC 24.1*  --   --   HGB 10.2* 11.2* 10.9*  HCT 34.1* 33.0* 32.0*  MCV 93.4  --   --   PLT 271  --   --     Basic Metabolic Panel: Recent Labs  Lab 07/27/23 1039 07/27/23 1059 07/27/23 1417  NA 133* 134* 137  K 5.0 5.2* 4.9  CL 102  --   --   CO2 17*  --   --   GLUCOSE  340*  --   --   BUN 82*  --   --   CREATININE 2.68*  --   --   CALCIUM 8.5*  --   --   MG 2.3  --   --    GFR: Estimated Creatinine Clearance: 25 mL/min (A) (by C-G formula based on SCr of 2.68 mg/dL (H)). Recent Labs  Lab 07/27/23 1039 07/27/23 1418  WBC 24.1*  --   LATICACIDVEN  --  1.4    Liver Function Tests: Recent Labs  Lab 07/27/23 1039  AST 39  ALT 30  ALKPHOS 172*  BILITOT 0.4  PROT 7.1  ALBUMIN 3.1*   No results for input(s): "LIPASE", "AMYLASE" in the last 168 hours. No results for input(s): "AMMONIA" in the last 168 hours.  ABG    Component Value Date/Time   PHART 7.334 (L) 01/02/2021 1055   PCO2ART 36.6 01/02/2021 1055   PO2ART 78 (L) 01/02/2021 1055   HCO3 17.1 (L) 07/27/2023 1417   TCO2 18 (L) 07/27/2023 1417   ACIDBASEDEF 8.0 (H) 07/27/2023 1417   O2SAT 93 07/27/2023 1417     Coagulation Profile: No results for input(s): "INR", "PROTIME" in the last 168 hours.  Cardiac Enzymes: No results for input(s): "CKTOTAL", "CKMB", "CKMBINDEX", "TROPONINI" in the last 168 hours.  HbA1C: Hemoglobin A1C  Date/Time Value Ref Range Status  06/21/2017 12:00 AM 9.8  Final   HbA1c, POC (controlled diabetic range)  Date/Time Value Ref Range Status  09/13/2022 02:47 PM 9.6 (A) 0.0 - 7.0 % Final  08/11/2021 02:42 PM 10.5 (A) 0.0 - 7.0 % Final   Hgb A1c MFr Bld  Date/Time Value Ref Range Status  04/18/2022 04:28 AM 10.3 (H) 4.8 - 5.6 % Final    Comment:    (NOTE) Pre diabetes:          5.7%-6.4%  Diabetes:              >6.4%  Glycemic control for   <7.0% adults with diabetes     CBG: Recent Labs  Lab 07/27/23 1008  GLUCAP 390*    Review of Systems:   Review of Systems  Constitutional:  Positive for malaise/fatigue.  HENT: Negative.    Eyes: Negative.   Respiratory:  Positive for shortness of breath. Negative for cough, hemoptysis and sputum production.   Cardiovascular:  Positive for orthopnea and leg swelling.  Gastrointestinal:   Positive for diarrhea and nausea.  Genitourinary:  Positive for dysuria. Negative for flank pain.  Musculoskeletal:  Positive for back pain.  Skin: Negative.   Neurological:  Positive for headaches.  Endo/Heme/Allergies:  Positive for polydipsia.  Psychiatric/Behavioral: Negative.       Past Medical History:  She,  has a past medical history of Alcohol abuse, Alcoholic cirrhosis ([name]), Anemia, Anxiety, Arthritis, B12 deficiency, CAD (coronary artery disease), Cardiomyopathy ([name]), Carotid artery disease ([name]), Cellulitis, CHF (congestive heart failure) ([name]), Chronic combined systolic and diastolic CHF (congestive heart failure) ([name]), CKD (chronic kidney disease), stage III ([name]), Cocaine abuse ([name]), COPD (chronic obstructive pulmonary disease) ([name]), Critical lower limb ischemia ([name]), Depression, Diabetes mellitus without complication ([name]), Diabetic peripheral neuropathy ([name]), DVT (deep venous thrombosis) ([name]), Dyspnea, Elevated troponin, GERD (gastroesophageal reflux disease), Hyperlipemia, Hypertension, Hypokalemia, Hypomagnesemia, Hypothyroidism, Marijuana abuse, Narcotic abuse ([name]), Noncompliance, NSVT (nonsustained ventricular tachycardia) ([name]), Obesity, PAF (paroxysmal atrial fibrillation) ([name]), Paroxysmal atrial tachycardia, Peripheral arterial disease ([name]), Pneumonia, Poorly controlled type 2 diabetes mellitus ([name]), Renal disorder, Renal insufficiency, Sleep apnea, Symptomatic bradycardia, and Tobacco abuse.   Surgical History:   Past Surgical History:  Procedure Laterality Date   ABDOMINAL AORTOGRAM N/A 06/26/2019   Procedure: ABDOMINAL AORTOGRAM;  Surgeon: Iran Ouch, MD;  Location: [facility];  Service: Cardiovascular;  Laterality: N/A;   ABDOMINAL AORTOGRAM W/LOWER EXTREMITY N/A 06/07/2021   Procedure: ABDOMINAL AORTOGRAM W/LOWER EXTREMITY;  Surgeon: Runell Gess, MD;  Location: [facility];  Service: Cardiovascular;  Laterality: N/A;   AMPUTATION  Right 06/14/2017   Procedure: Right foot transmetatarsal amputation;  Surgeon: Nadara Mustard, MD;  Location: [facility] OR;  Service: Orthopedics;  Laterality: Right;   AMPUTATION Left 06/28/2019   Procedure: AMPUTATION LEFT FIFTH TOE;  Surgeon: Larina Earthly, MD;  Location: [facility] OR;  Service: Vascular;  Laterality: Left;   AMPUTATION TOE Right 04/28/2017   Procedure: AMPUTATION OF RIGHT SECOND RAY;  Surgeon: Nadara Mustard, MD;  Location: [facility] OR;  Service: Orthopedics;  Laterality: Right;   CARDIAC CATHETERIZATION     CARDIAC CATHETERIZATION N/A 01/12/2016   Procedure: Temporary Wire;  Surgeon: Rollene Rotunda, MD;  Location: [facility];  Service: Cardiovascular;  Laterality: N/A;   CARDIOVERSION  ~ 02/2013   "twice"    CAROTID ANGIOGRAM N/A 01/15/2015   Procedure: CAROTID ANGIOGRAM;  Surgeon: Runell Gess, MD;  Location: [facility] CATH LAB;  Service: Cardiovascular;  Laterality: N/A;   DILATION AND CURETTAGE OF UTERUS  1988   ENDARTERECTOMY Left 02/19/2015   Procedure: LEFT CAROTID ENDARTERECTOMY ;  Surgeon: Nada Libman, MD;  Location: [facility] OR;  Service: Vascular;  Laterality: Left;   FEMORAL-TIBIAL BYPASS GRAFT Left 06/28/2019   Procedure: BYPASS GRAFT LEFT LEG FEMORAL TO ANTERIOR TIBIAL ARTERY using LEFT GREATER SAPHENOUS VEIN;  Surgeon: Larina Earthly, MD;  Location: [facility] OR;  Service: Vascular;  Laterality: Left;   LEFT HEART CATHETERIZATION WITH CORONARY ANGIOGRAM N/A 10/31/2014   Procedure: LEFT HEART CATHETERIZATION WITH CORONARY ANGIOGRAM;  Surgeon: Kathleene Hazel, MD;  Location: [facility] CATH LAB;  Service: Cardiovascular;  Laterality: N/A;   LOWER EXTREMITY ANGIOGRAM N/A 09/10/2013   Procedure: LOWER EXTREMITY ANGIOGRAM;  Surgeon: Runell Gess, MD;  Location: [facility] CATH LAB;  Service: Cardiovascular;  Laterality: N/A;   LOWER EXTREMITY ANGIOGRAM N/A 01/15/2015   Procedure: LOWER EXTREMITY ANGIOGRAM;  Surgeon: Runell Gess, MD;  Location: [facility] CATH LAB;  Service: Cardiovascular;  Laterality: N/A;    LOWER EXTREMITY ANGIOGRAPHY N/A 04/13/2017   Procedure: Lower Extremity Angiography;  Surgeon: Runell Gess, MD;  Location: [facility] INVASIVE CV LAB;  Service: Cardiovascular;  Laterality: N/A;   LOWER EXTREMITY ANGIOGRAPHY Left 06/26/2019   Procedure: LOWER EXTREMITY ANGIOGRAPHY;  Surgeon: Iran Ouch, MD;  Location: [facility];  Service: Cardiovascular;  Laterality: Left;   PERIPHERAL VASCULAR ATHERECTOMY Right 06/07/2021   Procedure: PERIPHERAL VASCULAR ATHERECTOMY;  Surgeon: Runell Gess, MD;  Location: [facility] INVASIVE CV LAB;  Service: Cardiovascular;  Laterality: Right;   PERIPHERAL VASCULAR BALLOON ANGIOPLASTY Left 06/26/2019   Procedure: PERIPHERAL VASCULAR BALLOON ANGIOPLASTY;  Surgeon: Iran Ouch, MD;  Location: [facility];  Service: Cardiovascular;  Laterality: Left;  unable to cross lt sfa occlusion   PERIPHERAL VASCULAR INTERVENTION  04/13/2017   Procedure: Peripheral Vascular Intervention;  Surgeon: Runell Gess, MD;  Location: [facility] INVASIVE CV LAB;  Service: Cardiovascular;;   PRESSURE SENSOR/CARDIOMEMS N/A 02/05/2020   Procedure: PRESSURE SENSOR/CARDIOMEMS;  Surgeon: Laurey Morale, MD;  Location: [facility] INVASIVE CV LAB;  Service: Cardiovascular;  Laterality: N/A;   VEIN HARVEST Left 06/28/2019   Procedure: LEFT LEG GREATER SAPHENOUS VEIN HARVEST;  Surgeon: Larina Earthly, MD;  Location: [facility] OR;  Service: Vascular;  Laterality: Left;     Social History:   reports that she has quit smoking. Her smoking use included e-cigarettes and cigarettes. She has a 44 pack-year smoking history. She has quit using smokeless tobacco.  Her smokeless tobacco use included snuff. She reports that she does not currently use alcohol. She reports that she does not currently use drugs after having used the following drugs: "Crack" cocaine, Marijuana, and Oxycodone.   Family History:  Her family history includes Breast cancer in her mother; Cancer in her mother; Clotting disorder  in her mother; Diabetes in her mother; Emphysema in her sister; Heart attack in her mother; Heart disease in her father and mother; Hypertension in her father and mother.   Allergies Allergies  Allergen Reactions   Gabapentin Nausea And Vomiting and Other (See Comments)    POSSIBLE SHAKING   Lyrica [Pregabalin] Other (See Comments)    Shaking      Home Medications  Prior to Admission medications   Medication Sig Start Date End Date Taking? Authorizing Provider  ACCU-CHEK GUIDE test strip USE TO CHECK BLOOD SUGAR FOUR TIMES DAILY 02/25/20   [provider]  acetaminophen (TYLENOL) 500 MG tablet Take 1,000 mg by mouth every 6 (six) hours as needed for headache (pain).    [provider]  albuterol (VENTOLIN HFA) 108 (90 Base) MCG/ACT inhaler USE 2 PUFFS BY MOUTH EVERY FOUR HOURS, AS NEEDED, FOR COUGHING/WHEEZING 07/12/23   [name], MD  apixaban (ELIQUIS) 5 MG TABS tablet Take 1 tablet (5 mg total) by mouth 2 (two) times daily. 07/05/23   Clegg, [name] D, NP  budesonide-formoterol (SYMBICORT) 160-4.5 MCG/ACT inhaler Inhale 2 puffs into the lungs 2 (two) times daily. 07/12/23   [name], MD  buPROPion ER (WELLBUTRIN SR) 100 MG 12 hr tablet Take 1 tablet (100 mg total) by mouth daily. 07/05/23   Clegg, [name] D, NP  Cholecalciferol (VITAMIN D3 PO) Take 1 capsule by mouth every morning.    [provider]  colchicine 0.6 MG tablet Take 1.2 mg by mouth See admin instructions. Take 1.2 mg for flair up and an hour later take 0.6 mg if needed    [provider]  Continuous Blood Gluc Sensor (FREESTYLE LIBRE 2 SENSOR) MISC USE 1 (ONE) EACH EVERY 2 WEEKS Patient not taking: Reported on 07/05/2023 07/14/21   [name], MD  EASY COMFORT PEN NEEDLES  31G X 5 MM MISC USE FOUR TIMES A DAY FOR INSULIN ADMINISTRATION 08/30/22   Anders Simmonds, PA-C  hydrALAZINE (APRESOLINE) 50 MG tablet Take 1 tablet (50 mg total) by mouth 2 (two) times daily. 07/05/23   Clegg, [name]  D, NP  insulin lispro (HUMALOG) 100 UNIT/ML KwikPen Inject 10 Units into the skin 3 (three) times daily with meals. 04/05/23   [name], MD  JARDIANCE 10 MG TABS tablet TAKE 1 TABLET (10 MG TOTAL) BY MOUTH DAILY BEFORE BREAKFAST (AM) 03/30/23   Milford, Anderson Malta, FNP  LANTUS SOLOSTAR 100 UNIT/ML Solostar Pen Inject 37 Units into the skin daily. Patient taking differently: Inject 38 Units into the skin daily. 04/05/23   [name], MD  levothyroxine (SYNTHROID) 50 MCG tablet TAKE 1 TABLET (50 MCG TOTAL) BY MOUTH DAILY BEFORE BREAKFAST (AM) 05/02/23   [name], MD  lidocaine (LIDODERM) 5 % Place 1 patch onto the skin daily. Remove & Discard patch within 12 hours or as directed by MD Patient not taking: Reported on 07/14/2023 03/20/23   Curatolo, Adam, DO  losartan (COZAAR) 25 MG tablet TAKE 2 TABLETS (50 MG TOTAL) BY MOUTH DAILY (AM) 05/02/23   [name], MD  Omega-3 Fatty Acids (FISH OIL PO) Take 1 capsule by mouth every morning.    [provider]  ondansetron (ZOFRAN-ODT) 8 MG disintegrating tablet Take 1 tablet (8 mg total) by mouth every 8 (eight) hours as needed for nausea or vomiting. 04/14/23   Garrison, [place] N, FNP  OVER THE COUNTER MEDICATION Take 2 tablets by mouth daily. Total beets chew    [provider]  pantoprazole (PROTONIX) 40 MG tablet TAKE 1 TABLET (40 MG TOTAL) BY MOUTH DAILY(AM) 05/02/23   [name], MD  potassium chloride SA (KLOR-CON M) 20 MEQ tablet Take 1 tablet (20 mEq total) by mouth 2 (two) times daily. 07/21/23   Laurey Morale, MD  rosuvastatin (CRESTOR) 40 MG tablet Take 1 tablet (40 mg total) by mouth daily. 09/07/22   Cannon Kettle, PA-C  spironolactone (ALDACTONE) 25 MG tablet TAKE 0.5 TABLET (12.5 MG TOTAL) BY MOUTH DAILY (AM) 10/11/22   Laurey Morale, MD  torsemide (DEMADEX) 100 MG tablet TAKE 1 TABLET (100 MG TOTAL) BY MOUTH 2 (TWO) TIMES DAILY. (AM+BEDTIME) 12/29/22   Laurey Morale, MD  triamcinolone ointment  (KENALOG) 0.1 % MIX 1:1 WITH CERA-VE (OTC) AND APPLY TOPICALLY 2 (TWO) TIMES DAILY AS NEEDED. 04/13/23   [name], MD  VITAMIN E PO Take 1 tablet by mouth daily.    [provider]  tiotropium (SPIRIVA HANDIHALER) 18 MCG inhalation capsule Place 1 capsule (18 mcg total) into inhaler and inhale every morning. Patient not taking: Reported on 02/09/2021 12/09/19 02/09/21  [name], MD     Critical care time: na      High MDM   Tessie Fass MSN, AGACNP-BC [facility] [phone] If no answer, [phone] 07/27/2023, 6:03 PM

## 2023-07-27 NOTE — ED Triage Notes (Signed)
Patient BIB EMS from home today for issues with high blood sugar. Patient states that at home her glucose monitor is reading high. She states that she was told by her doctor to decrease her insulin for a stent to be placed in her leg tomorrow for a blood clot. She states she has not been feeling well over the last few days. She states she is taking her insulin but it is not helping with her blood sugar.   Hx  CHF COPD DM

## 2023-07-27 NOTE — ED Notes (Signed)
ED TO INPATIENT HANDOFF REPORT  ED Nurse Name and Phone #: [phone]   S Name/Age/Gender [name] 62 y.o. female Room/Bed: 003C/003C  Code Status   Code Status: Full Code  Home/SNF/Other Home Patient oriented to: self, place, and time Is this baseline? Yes   Triage Complete: Triage complete  Chief Complaint Severe sepsis with acute organ dysfunction ([name]) [A41.9, R65.20]  Triage Note Patient BIB EMS from home today for issues with high blood sugar. Patient states that at home her glucose monitor is reading high. She states that she was told by her doctor to decrease her insulin for a stent to be placed in her leg tomorrow for a blood clot. She states she has not been feeling well over the last few days. She states she is taking her insulin but it is not helping with her blood sugar.   Hx  CHF COPD DM    Allergies Allergies  Allergen Reactions   Gabapentin Nausea And Vomiting and Other (See Comments)    POSSIBLE SHAKING   Lyrica [Pregabalin] Other (See Comments)    Shaking     Level of Care/Admitting Diagnosis ED Disposition     ED Disposition  Admit   Condition  --   Comment  Hospital [facility] [[id number]]  Level of Care: ICU [6]  May admit patient to [facility] or [facility] if equivalent level of care is available:: Yes  Covid Evaluation: Asymptomatic - no recent exposure (last 10 days) testing not required  Diagnosis: Severe sepsis with acute organ dysfunction [facility]) [[id number]]  Admitting Physician: Chilton Greathouse [[id number]]  Attending Physician: Chilton Greathouse [[id number]]  Certification:: I certify this patient will need inpatient services for at least 2 midnights  Expected Medical Readiness: 08/03/2023          B Medical/Surgery History Past Medical History:  Diagnosis Date   Alcohol abuse    Alcoholic cirrhosis ([name])    Anemia    Anxiety    Arthritis    "knees" (11/26/2018)   B12 deficiency    CAD (coronary  artery disease)    a. 11/10/2014 Cath: LM nl, LAD min irregs, D1 30 ost, D2 50d, LCX 38m, OM1 80 p/m (1.5 mm vessel), OM2 30m, RCA nondom 26m-->med rx.. Demand ischemia in the setting of rapid a-fib.   Cardiomyopathy ([name])    Carotid artery disease ([name])    a. 01/2015 Carotid Angio: RICA 100, LICA 95p; b. 02/2015 s/p L CEA; c. 05/2019 Carotid U/S: RICA 100. RECA >50. LICA 1-39%.   Cellulitis    lower extremities   CHF (congestive heart failure) ([name])    Chronic combined systolic and diastolic CHF (congestive heart failure) ([name])    a. 10/2014 Echo: EF 40-45%; b. 10/2018 Echo: EF 45-50%, gr2 DD; c. 11/2019 Echo: EF 50%, mild LVH, gr2 DD (restrictive), antlat HK, Nl RV fxn. Mild BAE. RVSP 59.91mmHg.   CKD (chronic kidney disease), stage III ([name])    Cocaine abuse ([name])    COPD (chronic obstructive pulmonary disease) ([name])    Critical lower limb ischemia ([name])    Depression    Diabetes mellitus without complication ([name])    Diabetic peripheral neuropathy ([name])    DVT (deep venous thrombosis) ([name])    Dyspnea    Elevated troponin    a. Chronic elevation.   GERD (gastroesophageal reflux disease)    Hyperlipemia    Hypertension    Hypokalemia    Hypomagnesemia    Hypothyroidism  Marijuana abuse    Narcotic abuse ([name])    Noncompliance    NSVT (nonsustained ventricular tachycardia) ([name])    Obesity    PAF (paroxysmal atrial fibrillation) ([name])    Paroxysmal atrial tachycardia    Peripheral arterial disease ([name])    a. 01/2015 Angio/PTA: RSFA 99 (atherectomy/pta) - 1 vessel runoff via diff dzs peroneal; b. 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   Pneumonia    "once or twice" (11/26/2018)   Poorly controlled type 2 diabetes mellitus ([name])    Renal disorder    Renal insufficiency    a. 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   Sleep apnea    "couldn't handle wearing the mask" (11/26/2018)   Symptomatic bradycardia    a. Avoid AV blocking agent per EP. Prev req temp wire in 2017.    Tobacco abuse    Past Surgical History:  Procedure Laterality Date   ABDOMINAL AORTOGRAM N/A 06/26/2019   Procedure: ABDOMINAL AORTOGRAM;  Surgeon: Iran Ouch, MD;  [facility]: [facility] INVASIVE CV LAB;  Service: Cardiovascular;  Laterality: N/A;   ABDOMINAL AORTOGRAM W/LOWER EXTREMITY N/A 06/07/2021   Procedure: ABDOMINAL AORTOGRAM W/LOWER EXTREMITY;  Surgeon: Runell Gess, MD;  Location: [facility] INVAS[facility] CV LAB;  Service: Cardiovascular;  Laterality: N/A;   AMPUTATION Right 06/14/2017   Procedure: Right foot transmetatarsal amputation;  Surgeon: Nadara Mustard, MD;  Location[facility] OR;  Service: Orthopedics;  Laterality: Right;   AMPUTATION Left 06/28/2019   Procedure: AMPUTATION LEFT FIFTH TOE;  Surgeon: Larina Earthly, MD;  Location: [facility] OR;  Service: Vascular;  Laterality: Left;   AMPUTATION TOE Right 04/28/2017   Procedure: AMPUTATION OF RIGHT SECOND RAY;  Surgeon: Nadara Mustard, MD;  Location: [facility] OR;  Service: Orthopedics;  Laterality: Right;   CARDIAC CATHETERIZATION     CARDIAC CATHETERIZATION N/A 01/12/2016   Procedure: Temporary Wire;  Surgeon: Rollene Rotunda, MD;  [facility]: [facility];  Service: Cardiovascular;  Laterality: N/A;   CARDIOVERSION  ~ 02/2013   "twice"    CAROTID ANGIOGRAM N/A 01/15/2015   Procedure: CAROTID ANGIOGRAM;  Surgeon: Runell Gess, MD;  Location: [facility] CATH LAB;  Service: Cardiovascular;  Laterality: N/A;   DILATION AND CURETTAGE OF UTERUS  1988   ENDARTERECTOMY Left 02/19/2015   Procedure: LEFT CAROTID ENDARTERECTOMY ;  Surgeon: Nada Libman, MD;  Location[facility] OR;  Service: Vascular;  Laterality: Left;   FEMORAL-TIBIAL BYPASS GRAFT Left 06/28/2019   Procedure: BYPASS GRAFT LEFT LEG FEMORAL TO ANTERIOR TIBIAL ARTERY using LEFT GREATER SAPHENOUS VEIN;  Surgeon: Larina Earthly, MD;  Location: [facility] OR;  Service: Vascular;  Laterality: Left;   LEFT HEART CATHETERIZATION WITH CORONARY ANGIOGRAM N/A 10/31/2014   Procedure: LEFT HEART CATHETERIZATION WITH CORONARY  ANGIOGRAM;  Surgeon: Kathleene Hazel, MD;  Location: [facility] CATH LAB;  Service: Cardiovascular;  Laterality: N/A;   LOWER EXTREMITY ANGIOGRAM N/A 09/10/2013   Procedure: LOWER EXTREMITY ANGIOGRAM;  Surgeon: Runell Gess, MD;  Location: [facility] CATH LAB;  Service: Cardiovascular;  Laterality: N/A;   LOWER EXTREMITY ANGIOGRAM N/A 01/15/2015   Procedure: LOWER EXTREMITY ANGIOGRAM;  Surgeon: Runell Gess, MD;  Location: [facility] CATH LAB;  Service: Cardiovascular;  Laterality: N/A;   LOWER EXTREMITY ANGIOGRAPHY N/A 04/13/2017   Procedure: Lower Extremity Angiography;  Surgeon: Runell Gess, MD;  Location: [facility] INVASIVE CV LAB;  Service: Cardiovascular;  Laterality: N/A;   LOWER EXTREMITY ANGIOGRAPHY Left 06/26/2019   Procedure: LOWER EXTREMITY ANGIOGRAPHY;  Surgeon: Iran Ouch, MD;  Location: [facility] INVASIVE CV LAB;  Service: Cardiovascular;  Laterality: Left;   PERIPHERAL VASCULAR ATHERECTOMY Right 06/07/2021   Procedure: PERIPHERAL VASCULAR ATHERECTOMY;  Surgeon: Runell Gess, MD;  Location: [facility] INVASIVE CV LAB;  Service: Cardiovascular;  Laterality: Right;   PERIPHERAL VASCULAR BALLOON ANGIOPLASTY Left 06/26/2019   Procedure: PERIPHERAL VASCULAR BALLOON ANGIOPLASTY;  Surgeon: Iran Ouch, MD;  [facility];  Service: Cardiovascular;  Laterality: Left;  unable to cross lt sfa occlusion   PERIPHERAL VASCULAR INTERVENTION  04/13/2017   Procedure: Peripheral Vascular Intervention;  Surgeon: Runell Gess, MD;  Location: [facility] INVASIVE CV LAB;  Service: Cardiovascular;;   PRESSURE SENSOR/CARDIOMEMS N/A 02/05/2020   Procedure: PRESSURE SENSOR/CARDIOMEMS;  Surgeon: Laurey Morale, MD;  Location: [facility] INVASIVE CV LAB;  Service: Cardiovascular;  Laterality: N/A;   VEIN HARVEST Left 06/28/2019   Procedure: LEFT LEG GREATER SAPHENOUS VEIN HARVEST;  Surgeon: Larina Earthly, MD;  Location: [facility] OR;  Service: Vascular;  Laterality: Left;     A IV Location/Drains/Wounds Patient  Lines/Drains/Airways Status     Active Line/Drains/Airways     Name Placement date Placement time Site Days   Peripheral IV 07/27/23 20 G Left;Posterior Hand 07/27/23  --  Hand  less than 1   Peripheral IV 07/27/23 20 G Right Wrist 07/27/23  1400  Wrist  less than 1   Pressure Injury 05/04/21 Other (Comment) Right Stage 3 -  Full thickness tissue loss. Subcutaneous fat may be visible but bone, tendon or muscle are NOT exposed. 0.5x0.5, 1 inch depth 05/04/21  0630  -- 814   Wound / Incision (Open or Dehisced) 06/14/21 Diabetic ulcer Foot Right;Distal 06/14/21  0700  Foot  773   Wound / Incision (Open or Dehisced) 04/18/22 Irritant Dermatitis (Moisture Associated Skin Damage) Abdomen Right;Left RED MASD to skin folds abdomen/groin 04/18/22  1855  Abdomen  465   Wound / Incision (Open or Dehisced) 04/18/22 Breast Bilateral MASD under bilateral breast 04/18/22  1855  Breast  465            Intake/Output Last 24 hours No intake or output data in the 24 hours ending 07/27/23 1752  Labs/Imaging Results for orders placed or performed during the hospital encounter of 07/27/23 (from the past 48 hour(s))  CBG monitoring, ED     Status: Abnormal   Collection Time: 07/27/23 10:08 AM  Result Value Ref Range   Glucose-Capillary 390 (H) 70 - 99 mg/dL    Comment: Glucose reference range applies only to samples taken after fasting for at least 8 hours.  Magnesium     Status: None   Collection Time: 07/27/23 10:39 AM  Result Value Ref Range   Magnesium 2.3 1.7 - 2.4 mg/dL    Comment: Performed at [facility] Lab, [address] [place], [place] [address]  CBC     Status: Abnormal   Collection Time: 07/27/23 10:39 AM  Result Value Ref Range   WBC 24.1 (H) 4.0 - 10.5 K/uL   RBC 3.65 (L) 3.87 - 5.11 MIL/uL   Hemoglobin 10.2 (L) 12.0 - 15.0 g/dL   HCT 21.3 (L) 08.6 - 57.8 %   MCV 93.4 80.0 - 100.0 fL   MCH 27.9 26.0 - 34.0 pg   MCHC 29.9 (L) 30.0 - 36.0 g/dL   RDW 46.9 62.9 - 52.8 %    Platelets 271 150 - 400 K/uL   nRBC 0.0 0.0 - 0.2 %  Comment: Performed at [facility] Lab, [address], [place], [place] [address]  Comprehensive metabolic panel     Status: Abnormal   Collection Time: 07/27/23 10:39 AM  Result Value Ref Range   Sodium 133 (L) 135 - 145 mmol/L   Potassium 5.0 3.5 - 5.1 mmol/L   Chloride 102 98 - 111 mmol/L   CO2 17 (L) 22 - 32 mmol/L   Glucose, Bld 340 (H) 70 - 99 mg/dL    Comment: Glucose reference range applies only to samples taken after fasting for at least 8 hours.   BUN 82 (H) 6 - 20 mg/dL   Creatinine, Ser 2.44 (H) 0.44 - 1.00 mg/dL   Calcium 8.5 (L) 8.9 - 10.3 mg/dL   Total Protein 7.1 6.5 - 8.1 g/dL   Albumin 3.1 (L) 3.5 - 5.0 g/dL   AST 39 15 - 41 U/L   ALT 30 0 - 44 U/L   Alkaline Phosphatase 172 (H) 38 - 126 U/L   Total Bilirubin 0.4 0.3 - 1.2 mg/dL   GFR, Estimated 20 (L) >60 mL/min    Comment: (NOTE) Calculated using the CKD-EPI Creatinine Equation (2021)    Anion gap 14 5 - 15    Comment: Performed at [facility] Lab, [address] [place], [place] [address]  Beta-hydroxybutyric acid     Status: None   Collection Time: 07/27/23 10:39 AM  Result Value Ref Range   Beta-Hydroxybutyric Acid 0.07 0.05 - 0.27 mmol/L    Comment: Performed at [facility] Lab, [address], [place], [place] [address]  I-Stat Venous Blood Gas, ED     Status: Abnormal   Collection Time: 07/27/23 10:59 AM  Result Value Ref Range   pH, Ven 7.372 7.25 - 7.43   pCO2, Ven 27.9 (L) 44 - 60 mmHg   pO2, Ven 70 (H) 32 - 45 mmHg   Bicarbonate 16.2 (L) 20.0 - 28.0 mmol/L   TCO2 17 (L) 22 - 32 mmol/L   O2 Saturation 94 %   Acid-base deficit 8.0 (H) 0.0 - 2.0 mmol/L   Sodium 134 (L) 135 - 145 mmol/L   Potassium 5.2 (H) 3.5 - 5.1 mmol/L   Calcium, Ion 0.98 (L) 1.15 - 1.40 mmol/L   HCT 33.0 (L) 36.0 - 46.0 %   Hemoglobin 11.2 (L) 12.0 - 15.0 g/dL   Sample type VENOUS   I-Stat venous blood gas, ([facility] ED, [facility], [facility])     Status: Abnormal    Collection Time: 07/27/23  2:17 PM  Result Value Ref Range   pH, Ven 7.339 7.25 - 7.43   pCO2, Ven 31.8 (L) 44 - 60 mmHg   pO2, Ven 71 (H) 32 - 45 mmHg   Bicarbonate 17.1 (L) 20.0 - 28.0 mmol/L   TCO2 18 (L) 22 - 32 mmol/L   O2 Saturation 93 %   Acid-base deficit 8.0 (H) 0.0 - 2.0 mmol/L   Sodium 137 135 - 145 mmol/L   Potassium 4.9 3.5 - 5.1 mmol/L   Calcium, Ion 1.05 (L) 1.15 - 1.40 mmol/L   HCT 32.0 (L) 36.0 - 46.0 %   Hemoglobin 10.9 (L) 12.0 - 15.0 g/dL   Sample type VENOUS   I-Stat CG4 Lactic Acid     Status: None   Collection Time: 07/27/23  2:18 PM  Result Value Ref Range   Lactic Acid, Venous 1.4 0.5 - 1.9 mmol/L  Urinalysis, Routine w reflex microscopic -Urine, Clean Catch     Status:  Abnormal   Collection Time: 07/27/23  2:49 PM  Result Value Ref Range   Color, Urine AMBER (A) YELLOW    Comment: BIOCHEMICALS MAY BE AFFECTED BY COLOR   APPearance CLOUDY (A) CLEAR   Specific Gravity, Urine 1.016 1.005 - 1.030   pH 5.0 5.0 - 8.0   Glucose, UA 50 (A) NEGATIVE mg/dL   Hgb urine dipstick MODERATE (A) NEGATIVE   Bilirubin Urine NEGATIVE NEGATIVE   Ketones, ur 5 (A) NEGATIVE mg/dL   Protein, ur 30 (A) NEGATIVE mg/dL   Nitrite POSITIVE (A) NEGATIVE   Leukocytes,Ua MODERATE (A) NEGATIVE   RBC / HPF 11-20 0 - 5 RBC/hpf   WBC, UA 21-50 0 - 5 WBC/hpf   Bacteria, UA MANY (A) NONE SEEN   Squamous Epithelial / HPF 6-10 0 - 5 /HPF   Mucus PRESENT     Comment: Performed at [facility] Lab, [address], [place], [place] [address]  Beta-hydroxybutyric acid     Status: None   Collection Time: 07/27/23  3:03 PM  Result Value Ref Range   Beta-Hydroxybutyric Acid 0.26 0.05 - 0.27 mmol/L    Comment: Performed at [facility] Lab, [address], [place], [place] [address]   *Note: Due to a large number of results and/or encounters for the requested time period, some results have not been displayed. A complete set of results can be found in Results Review.   DG Chest Portable  1 View  Result Date: 07/27/2023 CLINICAL DATA:  Hyperglycemia.  Congestive heart failure. EXAM: PORTABLE CHEST 1 VIEW COMPARISON:  None Available. FINDINGS: The heart size and mediastinal contours are within normal limits. Hypoinflation of the lungs. Both lungs are clear. The visualized skeletal structures are unremarkable. IMPRESSION: No active disease.  Hypoinflation of the lungs. Electronically Signed   By: Lupita Raider M.D.   On: 07/27/2023 12:05    Pending Labs Unresulted Labs (From admission, onward)     Start     Ordered   07/28/23 0500  CBC  Tomorrow morning,   R        07/27/23 1731   07/28/23 0500  Basic metabolic panel  Tomorrow morning,   R        07/27/23 1731   07/27/23 1731  Comprehensive metabolic panel  Once,   R        07/27/23 1731   07/27/23 1730  HIV Antibody (routine testing w rflx)  (HIV Antibody (Routine testing w reflex) panel)  Once,   R        07/27/23 1731   07/27/23 1715  TSH  Add-on,   AD        07/27/23 1714   07/27/23 1714  Hemoglobin A1c  Once,   URGENT       Comments: To assess prior glycemic control    07/27/23 1714   07/27/23 1654  Procalcitonin  ONCE - URGENT,   URGENT       References:    Procalcitonin Lower Respiratory Tract Infection AND Sepsis Procalcitonin Algorithm   07/27/23 1653   07/27/23 1243  Blood culture (routine x 2)  BLOOD CULTURE X 2,   R (with STAT occurrences)     Question Answer Comment  Patient immune status Normal   Release to patient Immediate      07/27/23 1242   07/27/23 1022  Beta-hydroxybutyric acid  (Diabetes Ketoacidosis (DKA))  Now then every 8 hours,   STAT (with URGENT occurrences)  07/27/23 1022            Vitals/Pain Today's Vitals   07/27/23 1315 07/27/23 1415 07/27/23 1443 07/27/23 1745  BP: (!) 179/71 (!) 158/78 (!) 179/71 (!) 165/79  Pulse: (!) 103 (!) 105 (!) 113 (!) 102  Resp: (!) 32 (!) 32 (!) 32 19  Temp:   99 F (37.2 C) (!) 100.6 F (38.1 C)  TempSrc:   Oral Oral  SpO2: 100% 100%  98% 100%  Weight:      Height:      PainSc:        Isolation Precautions No active isolations  Medications Medications  lactated ringers infusion ( Intravenous New Bag/Given 07/27/23 1633)  lactated ringers bolus 1,000 mL (has no administration in time range)  insulin aspart (novoLOG) injection 0-20 Units (has no administration in time range)  docusate sodium (COLACE) capsule 100 mg (has no administration in time range)  polyethylene glycol (MIRALAX / GLYCOLAX) packet 17 g (has no administration in time range)  rivaroxaban (XARELTO) tablet 10 mg (has no administration in time range)  ondansetron (ZOFRAN) injection 4 mg (has no administration in time range)  ipratropium-albuterol (DUONEB) 0.5-2.5 (3) MG/3ML nebulizer solution 3 mL (3 mLs Nebulization Given 07/27/23 1042)  lactated ringers bolus 1,000 mL (1,000 mLs Intravenous Bolus 07/27/23 1200)  ceFEPIme (MAXIPIME) 2 g in sodium chloride 0.9 % 100 mL IVPB (0 g Intravenous Stopped 07/27/23 1505)  vancomycin (VANCOCIN) IVPB 1000 mg/200 mL premix (0 mg Intravenous Stopped 07/27/23 1414)  methylPREDNISolone sodium succinate (SOLU-MEDROL) 125 mg/2 mL injection 125 mg (125 mg Intravenous Given 07/27/23 1633)    Mobility walks   Per patient she walks but unsure if this is reliable  Focused Assessments    R Recommendations: See Admitting Provider Note  Report given to:   Additional Notes:

## 2023-07-27 NOTE — ED Notes (Signed)
MD notified this RN he removed the bibap and placed on 2.5 L of oxygen

## 2023-07-27 NOTE — ED Notes (Signed)
Update family

## 2023-07-27 NOTE — Sepsis Progress Note (Signed)
eLink is following this Code Sepsis. °

## 2023-07-27 NOTE — Sepsis Progress Note (Signed)
Notified provider of need to order lactic acid. ° °

## 2023-07-27 NOTE — Progress Notes (Signed)
Pt called 911 due to not feeling well. I responded as well as the EMS unit.  She reports feeling bad past few days. She has had less urine production, discomfort when urination, seems a little lethargic today.  Feels very warm to touch, hurts all over.  More pain to her legs which is chronic.  She is due to have procedure tomor for the blockages.   V/s were WNL and her CBG was 430.  She reports her readings have read HI on her machine.  Overall weakness.   EMS txp her to ER.  Will f/u early next week.   Kerry Hough, EMT-Paramedic  [phone] 07/27/2023

## 2023-07-28 ENCOUNTER — Ambulatory Visit (HOSPITAL_COMMUNITY): Admission: RE | Admit: 2023-07-28 | Payer: 59 | Source: Home / Self Care | Admitting: Vascular Surgery

## 2023-07-28 ENCOUNTER — Inpatient Hospital Stay (HOSPITAL_COMMUNITY): Payer: 59

## 2023-07-28 ENCOUNTER — Encounter (HOSPITAL_COMMUNITY)
Admission: EM | Disposition: A | Discharge status: Home / Self Care | Payer: Self-pay | Source: Home / Self Care | Attending: Internal Medicine

## 2023-07-28 DIAGNOSIS — M7989 Other specified soft tissue disorders: Secondary | ICD-10-CM | POA: Diagnosis not present

## 2023-07-28 DIAGNOSIS — A419 Sepsis, unspecified organism: Secondary | ICD-10-CM | POA: Diagnosis not present

## 2023-07-28 DIAGNOSIS — R6520 Severe sepsis without septic shock: Secondary | ICD-10-CM | POA: Diagnosis not present

## 2023-07-28 LAB — BASIC METABOLIC PANEL
Anion gap: 8 (ref 5–15)
BUN: 55 mg/dL — ABNORMAL HIGH (ref 6–20)
CO2: 21 mmol/L — ABNORMAL LOW (ref 22–32)
Calcium: 8.4 mg/dL — ABNORMAL LOW (ref 8.9–10.3)
Chloride: 110 mmol/L (ref 98–111)
Creatinine, Ser: 1.51 mg/dL — ABNORMAL HIGH (ref 0.44–1.00)
GFR, Estimated: 39 mL/min — ABNORMAL LOW (ref >60)
Glucose, Bld: 208 mg/dL — ABNORMAL HIGH (ref 70–99)
Potassium: 4 mmol/L (ref 3.5–5.1)
Sodium: 139 mmol/L (ref 135–145)

## 2023-07-28 LAB — CBC
HCT: 27.6 % — ABNORMAL LOW (ref 36.0–46.0)
Hemoglobin: 8.3 g/dL — ABNORMAL LOW (ref 12.0–15.0)
MCH: 27.8 pg (ref 26.0–34.0)
MCHC: 30.1 g/dL (ref 30.0–36.0)
MCV: 92.3 fL (ref 80.0–100.0)
Platelets: 228 10*3/uL (ref 150–400)
RBC: 2.99 MIL/uL — ABNORMAL LOW (ref 3.87–5.11)
RDW: 14.9 % (ref 11.5–15.5)
WBC: 19 10*3/uL — ABNORMAL HIGH (ref 4.0–10.5)
nRBC: 0 % (ref 0.0–0.2)

## 2023-07-28 LAB — GLUCOSE, CAPILLARY
Glucose-Capillary: 311 mg/dL — ABNORMAL HIGH (ref 70–99)
Glucose-Capillary: 172 mg/dL — ABNORMAL HIGH (ref 70–99)
Glucose-Capillary: 205 mg/dL — ABNORMAL HIGH (ref 70–99)
Glucose-Capillary: 271 mg/dL — ABNORMAL HIGH (ref 70–99)
Glucose-Capillary: 262 mg/dL — ABNORMAL HIGH (ref 70–99)
Glucose-Capillary: 325 mg/dL — ABNORMAL HIGH (ref 70–99)

## 2023-07-28 LAB — HEMOGLOBIN A1C
Hgb A1c MFr Bld: 11.1 % — ABNORMAL HIGH (ref 4.8–5.6)
Mean Plasma Glucose: 272 mg/dL

## 2023-07-28 LAB — BETA-HYDROXYBUTYRIC ACID: Beta-Hydroxybutyric Acid: 0.05 mmol/L (ref 0.05–0.27)

## 2023-07-28 SURGERY — ABDOMINAL AORTOGRAM W/LOWER EXTREMITY
Anesthesia: LOCAL

## 2023-07-28 MED ORDER — INSULIN GLARGINE-YFGN 100 UNIT/ML ~~LOC~~ SOLN
20.0000 [IU] | Freq: Every day | SUBCUTANEOUS | Status: DC
  Administered 2023-07-28: 20 [IU] via SUBCUTANEOUS
  Filled 2023-07-28 (×2): qty 0.2

## 2023-07-28 MED ORDER — HYDRALAZINE HCL 50 MG PO TABS
50.0000 mg | ORAL_TABLET | Freq: Two times a day (BID) | ORAL | Status: DC
  Administered 2023-07-28 – 2023-07-30 (×5): 50 mg via ORAL
  Filled 2023-07-28 (×5): qty 1

## 2023-07-28 MED ORDER — SODIUM CHLORIDE 0.9 % IV SOLN: INTRAVENOUS | Status: DC

## 2023-07-28 MED ORDER — ROSUVASTATIN CALCIUM 20 MG PO TABS
40.0000 mg | ORAL_TABLET | Freq: Every day | ORAL | Status: DC
  Administered 2023-07-28 – 2023-08-05 (×9): 40 mg via ORAL
  Filled 2023-07-28 (×9): qty 2

## 2023-07-28 MED ORDER — LEVOTHYROXINE SODIUM 50 MCG PO TABS
50.0000 ug | ORAL_TABLET | Freq: Every day | ORAL | Status: DC
  Administered 2023-07-29 – 2023-08-05 (×8): 50 ug via ORAL
  Filled 2023-07-28 (×8): qty 1

## 2023-07-28 MED ORDER — CHLORHEXIDINE GLUCONATE CLOTH 2 % EX PADS
6.0000 | MEDICATED_PAD | Freq: Every day | CUTANEOUS | Status: DC
  Administered 2023-08-02 – 2023-08-05 (×3): 6 via TOPICAL

## 2023-07-28 MED ORDER — ORAL CARE MOUTH RINSE
15.0000 mL | OROMUCOSAL | Status: DC | PRN
  Administered 2023-07-28: 15 mL via OROMUCOSAL

## 2023-07-28 MED ORDER — ACETAMINOPHEN 325 MG PO TABS
650.0000 mg | ORAL_TABLET | Freq: Four times a day (QID) | ORAL | Status: DC | PRN
  Administered 2023-07-28 – 2023-08-05 (×10): 650 mg via ORAL
  Filled 2023-07-28 (×10): qty 2

## 2023-07-28 NOTE — Inpatient Diabetes Management (Signed)
Inpatient Diabetes Program Recommendations  AACE/ADA: New Consensus Statement on Inpatient Glycemic Control (2015)  Target Ranges:  Prepandial:   less than 140 mg/dL      Peak postprandial:   less than 180 mg/dL (1-2 hours)      Critically ill patients:  140 - 180 mg/dL   Lab Results  Component Value Date   GLUCAP 271 (H) 07/28/2023   HGBA1C 11.1 (H) 07/27/2023    Latest Reference Range & Units 07/27/23 19:41 07/27/23 23:01 07/28/23 03:53 07/28/23 07:40 07/28/23 11:30  Glucose-Capillary 70 - 99 mg/dL 244 (H) 010 (H) 272 (H) 205 (H) 271 (H)  (H): Data is abnormally high Review of Glycemic Control  Diabetes history: DM2 Outpatient Diabetes medications: Lantus 38 units Daily, Humalog 10 units TID, Jardiance 10 mg daily Current orders for Inpatient glycemic control: Novolog 0-20 units correction scale every 4 hours.  Inpatient Diabetes Program Recommendations:   Noted that blood sugars have been greater than 180 mg/dl.  Recommend adding Semglee 20 units daily and continuing Novolog 0-20 units correction scale as ordered if blood sugars continue to be elevated.  Smith Mince RN BSN CDE Diabetes Coordinator Pager: [phone]  8am-5pm

## 2023-07-28 NOTE — Consult Note (Signed)
[facility] [place]  ASSESSMENT / PLAN: [name] is a 62 y.o. female with atherosclerosis of native arteries of right lower extremity causing ulceration.  WIfI score calculated based on clinical exam and non-invasive measurements: 2 / 2 / 0. Stage III.  Recommend:  Abstinence from all tobacco products. Blood glucose control with goal A1c < 7%. Blood pressure control with goal blood pressure < 140/90 mmHg. Lipid reduction therapy with goal LDL-C <100 mg/dL  Aspirin 81mg  PO QD.  Atorvastatin 40-80mg  PO QD (or other "high intensity" statin therapy).  Patient admitted for urosepsis. Will defer angiogram to early next week. Hold Eliquis. Ok for heparin drip. Will check venous duplex of RLE for DVT.  CHIEF COMPLAINT: Right foot ulceration  HISTORY OF PRESENT ILLNESS: [name] is a 62 y.o. female who presents to clinic for evaluation of right foot ulceration after referral by PA Stone.  She is a prior patient of my partner, Dr. Tawanna Cooler Early who performed a left common femoral to posterior tibial artery bypass for limb threatening ischemia in 2020.  She has noticed development of a right plantar ulcer which is very painful to her.  She has previously undergone TMA.  I reviewed the rationale for an angiogram for limb salvage.  She is understanding and willing to proceed.     Past Medical History:  Diagnosis Date   Alcohol abuse    Alcoholic cirrhosis ([name])    Anemia    Anxiety    Arthritis    "knees" (11/26/2018)   B12 deficiency    CAD (coronary artery disease)    a. 11/10/2014 Cath: LM nl, LAD min irregs, D1 30 ost, D2 50d, LCX 68m, OM1 80 p/m (1.5 mm vessel), OM2 59m, RCA nondom 44m-->med rx.. Demand ischemia in the setting of rapid a-fib.   Cardiomyopathy ([name])    Carotid artery disease ([name])    a. 01/2015 Carotid Angio: RICA 100, LICA 95p; b. 02/2015 s/p L CEA; c. 05/2019 Carotid U/S: RICA 100. RECA >50. LICA 1-39%.   Cellulitis    lower extremities    CHF (congestive heart failure) ([name])    Chronic combined systolic and diastolic CHF (congestive heart failure) ([name])    a. 10/2014 Echo: EF 40-45%; b. 10/2018 Echo: EF 45-50%, gr2 DD; c. 11/2019 Echo: EF 50%, mild LVH, gr2 DD (restrictive), antlat HK, Nl RV fxn. Mild BAE. RVSP 59.59mmHg.   CKD (chronic kidney disease), stage III ([name])    Cocaine abuse ([name])    COPD (chronic obstructive pulmonary disease) ([name])    Critical lower limb ischemia ([name])    Depression    Diabetes mellitus without complication ([name])    Diabetic peripheral neuropathy ([name])    DVT (deep venous thrombosis) ([name])    Dyspnea    Elevated troponin    a. Chronic elevation.   GERD (gastroesophageal reflux disease)    Hyperlipemia    Hypertension    Hypokalemia    Hypomagnesemia    Hypothyroidism    Marijuana abuse    Narcotic abuse ([name])    Noncompliance    NSVT (nonsustained ventricular tachycardia) ([name])    Obesity    PAF (paroxysmal atrial fibrillation) ([name])    Paroxysmal atrial tachycardia    Peripheral arterial disease ([name])    a. 01/2015 Angio/PTA: RSFA 99 (atherectomy/pta) - 1 vessel runoff via diff dzs peroneal; b. 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   Pneumonia    "once  or twice" (11/26/2018)   Poorly controlled type 2 diabetes mellitus ([name])    Renal disorder    Renal insufficiency    a. 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   Sleep apnea    "couldn't handle wearing the mask" (11/26/2018)   Symptomatic bradycardia    a. Avoid AV blocking agent per EP. Prev req temp wire in 2017.   Tobacco abuse     Past Surgical History:  Procedure Laterality Date   ABDOMINAL AORTOGRAM N/A 06/26/2019   Procedure: ABDOMINAL AORTOGRAM;  Surgeon: Iran Ouch, MD;  Location: [facility];  Service: Cardiovascular;  Laterality: N/A;   ABDOMINAL AORTOGRAM W/LOWER EXTREMITY N/A 06/07/2021   Procedure: ABDOMINAL AORTOGRAM W/LOWER EXTREMITY;  Surgeon: Runell Gess, MD;  Location: [facility];  Service: Cardiovascular;  Laterality: N/A;   AMPUTATION Right 06/14/2017   Procedure: Right foot transmetatarsal amputation;  Surgeon: Nadara Mustard, MD;  Location: [facility] OR;  Service: Orthopedics;  Laterality: Right;   AMPUTATION Left 06/28/2019   Procedure: AMPUTATION LEFT FIFTH TOE;  Surgeon: Larina Earthly, MD;  Location: [facility] OR;  Service: Vascular;  Laterality: Left;   AMPUTATION TOE Right 04/28/2017   Procedure: AMPUTATION OF RIGHT SECOND RAY;  Surgeon: Nadara Mustard, MD;  Location: [facility] OR;  Service: Orthopedics;  Laterality: Right;   CARDIAC CATHETERIZATION     CARDIAC CATHETERIZATION N/A 01/12/2016   Procedure: Temporary Wire;  Surgeon: Rollene Rotunda, MD;  Location: [facility];  Service: Cardiovascular;  Laterality: N/A;   CARDIOVERSION  ~ 02/2013   "twice"    CAROTID ANGIOGRAM N/A 01/15/2015   Procedure: CAROTID ANGIOGRAM;  Surgeon: Runell Gess, MD;  Location: [facility] CATH LAB;  Service: Cardiovascular;  Laterality: N/A;   DILATION AND CURETTAGE OF UTERUS  1988   ENDARTERECTOMY Left 02/19/2015   Procedure: LEFT CAROTID ENDARTERECTOMY ;  Surgeon: Nada Libman, MD;  Location: [facility] OR;  Service: Vascular;  Laterality: Left;   FEMORAL-TIBIAL BYPASS GRAFT Left 06/28/2019   Procedure: BYPASS GRAFT LEFT LEG FEMORAL TO ANTERIOR TIBIAL ARTERY using LEFT GREATER SAPHENOUS VEIN;  Surgeon: Larina Earthly, MD;  Location: [facility] OR;  Service: Vascular;  Laterality: Left;   LEFT HEART CATHETERIZATION WITH CORONARY ANGIOGRAM N/A 10/31/2014   Procedure: LEFT HEART CATHETERIZATION WITH CORONARY ANGIOGRAM;  Surgeon: Kathleene Hazel, MD;  Location: [facility] CATH LAB;  Service: Cardiovascular;  Laterality: N/A;   LOWER EXTREMITY ANGIOGRAM N/A 09/10/2013   Procedure: LOWER EXTREMITY ANGIOGRAM;  Surgeon: Runell Gess, MD;  Location: [facility] CATH LAB;  Service: Cardiovascular;  Laterality: N/A;   LOWER EXTREMITY ANGIOGRAM N/A 01/15/2015   Procedure: LOWER EXTREMITY ANGIOGRAM;  Surgeon: Runell Gess, MD;   Location: [facility] CATH LAB;  Service: Cardiovascular;  Laterality: N/A;   LOWER EXTREMITY ANGIOGRAPHY N/A 04/13/2017   Procedure: Lower Extremity Angiography;  Surgeon: Runell Gess, MD;  Location: [facility] INVASIVE CV LAB;  Service: Cardiovascular;  Laterality: N/A;   LOWER EXTREMITY ANGIOGRAPHY Left 06/26/2019   Procedure: LOWER EXTREMITY ANGIOGRAPHY;  Surgeon: Iran Ouch, MD;  [facility]: [facility];  Service: Cardiovascular;  Laterality: Left;   PERIPHERAL VASCULAR ATHERECTOMY Right 06/07/2021   Procedure: PERIPHERAL VASCULAR ATHERECTOMY;  Surgeon: Runell Gess, MD;  Location: [facility] INVASIVE CV LAB;  Service: Cardiovascular;  Laterality: Right;   PERIPHERAL VASCULAR BALLOON ANGIOPLASTY Left 06/26/2019   Procedure: PERIPHERAL VASCULAR BALLOON ANGIOPLASTY;  Surgeon: Iran Ouch, MD;  [facility]: [facility];  Service: Cardiovascular;  Laterality: Left;  unable  to cross lt sfa occlusion   PERIPHERAL VASCULAR INTERVENTION  04/13/2017   Procedure: Peripheral Vascular Intervention;  Surgeon: Runell Gess, MD;  Location: [facility] INVASIVE CV LAB;  Service: Cardiovascular;;   PRESSURE SENSOR/CARDIOMEMS N/A 02/05/2020   Procedure: PRESSURE SENSOR/CARDIOMEMS;  Surgeon: Laurey Morale, MD;  Location: [facility] INVASIVE CV LAB;  Service: Cardiovascular;  Laterality: N/A;   VEIN HARVEST Left 06/28/2019   Procedure: LEFT LEG GREATER SAPHENOUS VEIN HARVEST;  Surgeon: Larina Earthly, MD;  Location: [facility] OR;  Service: Vascular;  Laterality: Left;    Family History  Problem Relation Age of Onset   Hypertension Mother    Diabetes Mother    Cancer Mother        breast, ovarian, colon   Clotting disorder Mother    Heart disease Mother    Heart attack Mother    Breast cancer Mother        in 23's   Hypertension Father    Heart disease Father    Emphysema Sister        smoked    Social History   Socioeconomic History   Marital status: Single    Spouse name: Not on file   Number of children: 1    Years of education: 12   Highest education level: 12th grade  Occupational History   Occupation: disabled  Tobacco Use   Smoking status: Former    Current packs/day: 1.00    Average packs/day: 1 pack/day for 44.0 years (44.0 ttl pk-yrs)    Types: E-cigarettes, Cigarettes   Smokeless tobacco: Former    Types: Snuff  Vaping Use   Vaping status: Former   Devices: 11/26/2018 "stopped months ago"  Substance and Sexual Activity   Alcohol use: Not Currently    Comment: occ   Drug use: Not Currently    Types: "Crack" cocaine, Marijuana, Oxycodone   Sexual activity: Not Currently  Other Topics Concern   Not on file  Social History Narrative   ** Merged History Encounter **       Lives in [place], in motel with sister.  They are looking to move but don't have a place to go yet.     Social Determinants of Health   Financial Resource Strain: Low Risk  (02/09/2023)   Overall Financial Resource Strain (CARDIA)    Difficulty of Paying Living Expenses: Not hard at all  Food Insecurity: No Food Insecurity (02/09/2023)   Hunger Vital Sign    Worried About Running Out of Food in the Last Year: Never true    Ran Out of Food in the Last Year: Never true  Transportation Needs: No Transportation Needs (02/09/2023)   PRAPARE - Administrator, Civil Service (Medical): No    Lack of Transportation (Non-Medical): No  Physical Activity: Inactive (02/09/2023)   Exercise Vital Sign    Days of Exercise per Week: 0 days    Minutes of Exercise per Session: 0 min  Stress: No Stress Concern Present (02/09/2023)   [employer] of Occupational Health - Occupational Stress Questionnaire    Feeling of Stress : Not at all  Social Connections: Not on file  Intimate Partner Violence: Not on file    Allergies  Allergen Reactions   Gabapentin Nausea And Vomiting and Other (See Comments)    POSSIBLE SHAKING   Lyrica [Pregabalin] Other (See Comments)    Shaking     Current  Facility-Administered Medications  Medication Dose Route Frequency Provider Last Rate Last Admin  0.9 %  sodium chloride infusion   Intravenous Continuous Leonie Douglas, MD 100 mL/hr at 07/28/23 1400 Infusion Verify at 07/28/23 1400   albuterol (PROVENTIL) (2.5 MG/3ML) 0.083% nebulizer solution 2.5 mg  2.5 mg Nebulization Q2H PRN Bowser, Kaylyn Layer, NP       apixaban (ELIQUIS) tablet 5 mg  5 mg Oral BID Lanier Clam, NP   5 mg at 07/28/23 1146   arformoterol (BROVANA) nebulizer solution 15 mcg  15 mcg Nebulization BID Lanier Clam, NP   15 mcg at 07/28/23 0806   budesonide (PULMICORT) nebulizer solution 0.5 mg  0.5 mg Nebulization BID Lanier Clam, NP   0.5 mg at 07/28/23 8469   Chlorhexidine Gluconate Cloth 2 % PADS 6 each  6 each Topical Daily Mannam, Praveen, MD       docusate sodium (COLACE) capsule 100 mg  100 mg Oral BID PRN Lanier Clam, NP       hydrALAZINE (APRESOLINE) tablet 50 mg  50 mg Oral BID Tiffany Kocher, DO   50 mg at 07/28/23 1146   insulin aspart (novoLOG) injection 0-20 Units  0-20 Units Subcutaneous Q4H Lanier Clam, NP   11 Units at 07/28/23 1147   insulin glargine-yfgn (SEMGLEE) injection 20 Units  20 Units Subcutaneous Daily Mannam, Praveen, MD   20 Units at 07/28/23 1413   [START ON 07/29/2023] levothyroxine (SYNTHROID) tablet 50 mcg  50 mcg Oral QAC breakfast Tiffany Kocher, DO       ondansetron [name]) injection 4 mg  4 mg Intravenous Q6H PRN Bowser, Kaylyn Layer, NP       Oral care mouth rinse  15 mL Mouth Rinse PRN Mannam, Praveen, MD   15 mL at 07/28/23 0431   piperacillin-tazobactam (ZOSYN) IVPB 3.375 g  3.375 g Intravenous Q8H Daylene Posey, RPH 12.5 mL/hr at 07/28/23 1413 3.375 g at 07/28/23 1413   polyethylene glycol (MIRALAX / GLYCOLAX) packet 17 g  17 g Oral Daily PRN Bowser, Kaylyn Layer, NP       revefenacin (YUPELRI) nebulizer solution 175 mcg  175 mcg Nebulization Daily Lanier Clam, NP   175 mcg at 07/28/23 0806   rosuvastatin (CRESTOR) tablet  40 mg  40 mg Oral Daily Tiffany Kocher, DO   40 mg at 07/28/23 1146    PHYSICAL EXAM Vitals:   07/28/23 1146 07/28/23 1200 07/28/23 1300 07/28/23 1400  BP: (!) 137/51 100/76 (!) 114/49 (!) 108/49  Pulse:  80 80 73  Resp:  15 13 (!) 26  Temp:      TempSrc:      SpO2:  100% 97% 97%  Weight:      Height:        Chronically unwell woman.  Obese. Left dorsalis pedis pulse weakly palpable Left fifth toe surgically absent No palpable pulses in right foot Well-healed right transmetatarsal amputation Right plantar ulcer unchanged Right calf edema, erythema and tenderness  PERTINENT LABORATORY AND RADIOLOGIC DATA  Most recent CBC    Latest Ref Rng & Units 07/27/2023    2:17 PM 07/27/2023   10:59 AM 07/27/2023   10:39 AM  CBC  WBC 4.0 - 10.5 K/uL   24.1   Hemoglobin 12.0 - 15.0 g/dL 62.9  52.8  41.3   Hematocrit 36.0 - 46.0 % 32.0  33.0  34.1   Platelets 150 - 400 K/uL   271      Most recent CMP    Latest Ref Rng & Units 07/27/2023  7:30 PM 07/27/2023    2:17 PM 07/27/2023   10:59 AM  CMP  Glucose 70 - 99 mg/dL 409     BUN 6 - 20 mg/dL 70     Creatinine 8.11 - 1.00 mg/dL 9.14     Sodium 782 - 956 mmol/L 136  137  134   Potassium 3.5 - 5.1 mmol/L 4.7  4.9  5.2   Chloride 98 - 111 mmol/L 105     CO2 22 - 32 mmol/L 18     Calcium 8.9 - 10.3 mg/dL 8.5     Total Protein 6.5 - 8.1 g/dL 7.0     Total Bilirubin 0.3 - 1.2 mg/dL 0.3     Alkaline Phos 38 - 126 U/L 154     AST 15 - 41 U/L 26     ALT 0 - 44 U/L 27       Renal function Estimated Creatinine Clearance: 33.3 mL/min (A) (by C-G formula based on SCr of 2.08 mg/dL (H)).  Hemoglobin A1C (no units)  Date Value  06/21/2017 9.8   HbA1c, POC (controlled diabetic range) (%)  Date Value  09/13/2022 9.6 (A)   Hgb A1c MFr Bld (%)  Date Value  07/27/2023 11.1 (H)    LDL Cholesterol  Date Value Ref Range Status  11/29/2022 46 0 - 99 mg/dL Final    Comment:           Total Cholesterol/HDL:CHD Risk Coronary Heart  Disease Risk Table                     Men   Women  1/2 Average Risk   3.4   3.3  Average Risk       5.0   4.4  2 X Average Risk   9.6   7.1  3 X Average Risk  23.4   11.0        Use the calculated Patient Ratio above and the CHD Risk Table to determine the patient's CHD Risk.        ATP III CLASSIFICATION (LDL):  <100     mg/dL   Optimal  213-086  mg/dL   Near or Above                    Optimal  130-159  mg/dL   Borderline  578-469  mg/dL   High  >629     mg/dL   Very High Performed at [facility], [address] [place], [place] [address]    Direct LDL  Date Value Ref Range Status  04/04/2022 90.8 0 - 99 mg/dL Final    Comment:    Performed at [facility], [address], [place], [place] [address]    +-------+-----------+-----------+------------+------------+  ABI/TBIToday's ABIToday's TBIPrevious ABIPrevious TBI  +-------+-----------+-----------+------------+------------+  Right .57        amputation .69         amputation    +-------+-----------+-----------+------------+------------+  Left  1.04       .61        .94         1.0           +-------+-----------+-----------+------------+------------+   Lower extremity duplex Right: Moderate progression is noted compared to previous study.   Atherosclerosis throughout.  1-49% stenosis in the ostial SFA, s/p atherectomy and angioplasty.  75-99% stenosis in the proximal/mid SFA, s/p atherectomy and angioplasty.  1-49% stenosis in the distal SFA, s/p atherectomy and  angioplasty.  30-49% stenosis in the proximal PTA.     Left: Mild progression is noted compared to previous study.    Atherosclerosis throughout.  Patent left femoral to ATA bypass graft with mildly elevated velocities  noted in the outflow consistent with 50-70% stenosis, low end range.    Rande Brunt. Lenell Antu, MD FACS [facility] Phone Number: [phone] 07/28/2023 2:27  PM   Total time spent on preparing this encounter including chart review, data review, collecting history, examining the patient, coordinating care for this established patient, 20 minutes  Portions of this report may have been transcribed using voice recognition software.  Every effort has been made to ensure accuracy; however, inadvertent computerized transcription errors may still be present.

## 2023-07-28 NOTE — Progress Notes (Signed)
NAME:  [name], MRN:  [id number], DOB:  Oct 22, 1961, LOS: 1 ADMISSION DATE:  07/27/2023, CONSULTATION DATE:  9/12 REFERRING MD:  Lyn Hollingshead TRH, CHIEF COMPLAINT:  AMS   History of Present Illness:  62 yo F PMH afib on xarelto, IDDM, HTN, PAD s/p R TMA, Tobacco use, COPD, CAD, CKD III,  gout, hx systolic and diastolic HF, who presented to ED 9/12 w AMS. Workup concerning for UTI. Over course of stay in Ed, rcvd 1 L LR followed by mIVF, empiric abx. Apparently at one point some incr WOB so put on BiPAP.  Sounds like mentation continued to worsen, at one point she may have been obtunded. There was concern that pt may further decompensate. TRH requested PCCM to eval for admission in this setting    No lactic acidosis, no shock.   Pertinent  Medical History  PVD CAD HTN Hypothyroidism IDDM w hyperglycemia  Afib on xarelto  Gout Systolic and diastolic HF  CKD III  Significant Hospital Events: Including procedures, antibiotic start and stop dates in addition to other pertinent events   9/12 ED, UA c/f UTI. Tachy, altered. Incr WOB. AKI on CKD. 1L LR then mIVF. BiPAP.  Medically stable for stepdown  Interim History / Subjective:  She reports feeling better this AM.  Objective   Blood pressure (!) 133/53, pulse 79, temperature 98.8 F (37.1 C), temperature source Oral, resp. rate (!) 23, height 5\' 4"  (1.626 m), weight 101.3 kg, SpO2 90%.    Vent Mode: PSV;BIPAP FiO2 (%):  [40 %] 40 % PEEP:  [5 cmH20] 5 cmH20 Pressure Support:  [5 cmH20] 5 cmH20   Intake/Output Summary (Last 24 hours) at 07/28/2023 0748 Last data filed at 07/28/2023 0600 Gross per 24 hour  Intake 3067.43 ml  Output 1300 ml  Net 1767.43 ml   Filed Weights   07/27/23 1200 07/28/23 0500  Weight: 95.3 kg 101.3 kg    Examination: General: Chronically ill-appearing, obese, NAD HENT: EOMI, normal sclera Lungs: CTAB, normal work of breathing on 2L [facility] Cardiovascular: RRR, no murmurs Abdomen: Soft, not  tender, not distended Extremities: Moving all 4 extremities. Right TMA, ulcer on the sole which appears clean, erythema on the right up to the calf  Neuro: A&Ox4, no overt focal neurologic deficits  Resolved Hospital Problem list   Acute encephalopathy   Assessment & Plan:  Severe sepsis w end organ dysfunction  UTI Hypovolemia Sepsis 2/2 UTI. Hemodynamically stable. Off IV fluids this AM. -zosyn  -follow Bcx, Ucx   Tobacco use COPD hx OSA -PRN BiPAP  -brovana budesonide yupelri PRN albuterol  -smoking cessation   IDDM -rSSI -May need basal   Afib on eliquis CHF CAD HTN  -Continue home eliquis, statin -Hold home spironolactone, losartan in setting of AKI  PAD She was scheduled for abdominal aortogram today. -VVS was notified, will proceed with procedure per VVS   Aki on CKD III  -Encourage PO fluids -hold diuretics and nephrotoxic agents   Hypothyroidsm  -restart PO synthroid   Hx etoh use Hx cirrhosis -not starting CIWA right now, consider pending clinical signs    Anemia of chronic dz -AM CBC  Best Practice (right click and "Reselect all SmartList Selections" daily)   Diet/type: NPO DVT prophylaxis: DOAC GI prophylaxis: N/A Lines: N/A Foley:  N/A Code Status:  full code Last date of multidisciplinary goals of care discussion [pending]  Critical care time: 35 min

## 2023-07-28 NOTE — Progress Notes (Signed)
RLE venous duplex has been completed.    Results can be found under chart review under CV PROC. 07/28/2023 4:02 PM Keval Nam RVT, RDMS

## 2023-07-28 NOTE — Progress Notes (Signed)
Report called and pt vitals w/in order set limits

## 2023-07-29 DIAGNOSIS — R6520 Severe sepsis without septic shock: Secondary | ICD-10-CM | POA: Diagnosis not present

## 2023-07-29 DIAGNOSIS — A419 Sepsis, unspecified organism: Secondary | ICD-10-CM | POA: Diagnosis not present

## 2023-07-29 LAB — HEPARIN LEVEL (UNFRACTIONATED): Heparin Unfractionated: >1.1 [IU]/mL — ABNORMAL HIGH (ref 0.30–0.70)

## 2023-07-29 LAB — GLUCOSE, CAPILLARY
Glucose-Capillary: 116 mg/dL — ABNORMAL HIGH (ref 70–99)
Glucose-Capillary: 137 mg/dL — ABNORMAL HIGH (ref 70–99)
Glucose-Capillary: 209 mg/dL — ABNORMAL HIGH (ref 70–99)
Glucose-Capillary: 231 mg/dL — ABNORMAL HIGH (ref 70–99)
Glucose-Capillary: 346 mg/dL — ABNORMAL HIGH (ref 70–99)
Glucose-Capillary: 387 mg/dL — ABNORMAL HIGH (ref 70–99)

## 2023-07-29 LAB — CBC
HCT: 28.6 % — ABNORMAL LOW (ref 36.0–46.0)
Hemoglobin: 8.6 g/dL — ABNORMAL LOW (ref 12.0–15.0)
MCH: 28.8 pg (ref 26.0–34.0)
MCHC: 30.1 g/dL (ref 30.0–36.0)
MCV: 95.7 fL (ref 80.0–100.0)
Platelets: 209 10*3/uL (ref 150–400)
RBC: 2.99 MIL/uL — ABNORMAL LOW (ref 3.87–5.11)
RDW: 14.9 % (ref 11.5–15.5)
WBC: 15.6 10*3/uL — ABNORMAL HIGH (ref 4.0–10.5)
nRBC: 0 % (ref 0.0–0.2)

## 2023-07-29 LAB — BASIC METABOLIC PANEL
Anion gap: 8 (ref 5–15)
BUN: 47 mg/dL — ABNORMAL HIGH (ref 6–20)
CO2: 20 mmol/L — ABNORMAL LOW (ref 22–32)
Calcium: 8 mg/dL — ABNORMAL LOW (ref 8.9–10.3)
Chloride: 111 mmol/L (ref 98–111)
Creatinine, Ser: 1.39 mg/dL — ABNORMAL HIGH (ref 0.44–1.00)
GFR, Estimated: 43 mL/min — ABNORMAL LOW (ref >60)
Glucose, Bld: 127 mg/dL — ABNORMAL HIGH (ref 70–99)
Potassium: 4.2 mmol/L (ref 3.5–5.1)
Sodium: 139 mmol/L (ref 135–145)

## 2023-07-29 LAB — PROCALCITONIN: Procalcitonin: 21.13 ng/mL

## 2023-07-29 LAB — APTT: aPTT: 43 s — ABNORMAL HIGH (ref 24–36)

## 2023-07-29 MED ORDER — INSULIN GLARGINE-YFGN 100 UNIT/ML ~~LOC~~ SOLN
30.0000 [IU] | Freq: Every day | SUBCUTANEOUS | Status: DC
  Administered 2023-07-29: 30 [IU] via SUBCUTANEOUS
  Filled 2023-07-29 (×2): qty 0.3

## 2023-07-29 MED ORDER — TRAMADOL HCL 50 MG PO TABS
50.0000 mg | ORAL_TABLET | Freq: Three times a day (TID) | ORAL | Status: DC | PRN
  Administered 2023-07-29: 50 mg via ORAL
  Filled 2023-07-29: qty 1

## 2023-07-29 MED ORDER — TORSEMIDE 20 MG PO TABS
50.0000 mg | ORAL_TABLET | Freq: Two times a day (BID) | ORAL | Status: DC
  Administered 2023-07-29 – 2023-07-30 (×2): 50 mg via ORAL
  Filled 2023-07-29 (×2): qty 3

## 2023-07-29 MED ORDER — HEPARIN (PORCINE) 25000 UT/250ML-% IV SOLN
2150.0000 [IU]/h | INTRAVENOUS | Status: DC
  Administered 2023-07-29: 1350 [IU]/h via INTRAVENOUS
  Administered 2023-07-30: 1650 [IU]/h via INTRAVENOUS
  Administered 2023-07-30: 1750 [IU]/h via INTRAVENOUS
  Administered 2023-07-31: 2150 [IU]/h via INTRAVENOUS
  Administered 2023-07-31: 1950 [IU]/h via INTRAVENOUS
  Administered 2023-08-01: 2150 [IU]/h via INTRAVENOUS
  Filled 2023-07-29 (×6): qty 250

## 2023-07-29 NOTE — Progress Notes (Signed)
Attempted collection x3 today of urine culture, stool in urine multiple times today. Night RN made aware of urine culture needed.

## 2023-07-29 NOTE — Progress Notes (Signed)
ANTICOAGULATION CONSULT NOTE - Initial Consult  Pharmacy Consult for Eliquis ->Heparin Indication:  PAF (holding Eliquis for  angiogram  Allergies  Allergen Reactions   Gabapentin Nausea And Vomiting and Other (See Comments)    POSSIBLE SHAKING   Lyrica [Pregabalin] Other (See Comments)    Shaking     Patient Measurements: Height: 5\' 4"  (162.6 cm) Weight: 101.3 kg (223 lb 5.2 oz) IBW/kg (Calculated) : 54.7 Heparin Dosing Weight: 88 kg  Vital Signs: Temp: 100.4 F (38 C) (09/14 0528) Temp Source: Oral (09/14 0528) BP: 125/66 (09/14 0528) Pulse Rate: 86 (09/14 0528)  Labs: Recent Labs    07/27/23 1039 07/27/23 1059 07/27/23 1417 07/27/23 1930 07/28/23 1408  HGB 10.2* 11.2* 10.9*  --  8.3*  HCT 34.1* 33.0* 32.0*  --  27.6*  PLT 271  --   --   --  228  CREATININE 2.68*  --   --  2.08* 1.51*    Estimated Creatinine Clearance: 45.8 mL/min (A) (by C-G formula based on SCr of 1.51 mg/dL (H)).   Medical History: Past Medical History:  Diagnosis Date   Alcohol abuse    Alcoholic cirrhosis ([name])    Anemia    Anxiety    Arthritis    "knees" (11/26/2018)   B12 deficiency    CAD (coronary artery disease)    a. 11/10/2014 Cath: LM nl, LAD min irregs, D1 30 ost, D2 50d, LCX 49m, OM1 80 p/m (1.5 mm vessel), OM2 21m, RCA nondom 97m-->med rx.. Demand ischemia in the setting of rapid a-fib.   Cardiomyopathy ([name])    Carotid artery disease ([name])    a. 01/2015 Carotid Angio: RICA 100, LICA 95p; b. 02/2015 s/p L CEA; c. 05/2019 Carotid U/S: RICA 100. RECA >50. LICA 1-39%.   Cellulitis    lower extremities   CHF (congestive heart failure) ([name])    Chronic combined systolic and diastolic CHF (congestive heart failure) ([name])    a. 10/2014 Echo: EF 40-45%; b. 10/2018 Echo: EF 45-50%, gr2 DD; c. 11/2019 Echo: EF 50%, mild LVH, gr2 DD (restrictive), antlat HK, Nl RV fxn. Mild BAE. RVSP 59.30mmHg.   CKD (chronic kidney disease), stage III ([name])    Cocaine abuse ([name])    COPD (chronic  obstructive pulmonary disease) ([name])    Critical lower limb ischemia ([name])    Depression    Diabetes mellitus without complication ([name])    Diabetic peripheral neuropathy ([name])    DVT (deep venous thrombosis) ([name])    Dyspnea    Elevated troponin    a. Chronic elevation.   GERD (gastroesophageal reflux disease)    Hyperlipemia    Hypertension    Hypokalemia    Hypomagnesemia    Hypothyroidism    Marijuana abuse    Narcotic abuse ([name])    Noncompliance    NSVT (nonsustained ventricular tachycardia) ([name])    Obesity    PAF (paroxysmal atrial fibrillation) ([name])    Paroxysmal atrial tachycardia    Peripheral arterial disease ([name])    a. 01/2015 Angio/PTA: RSFA 99 (atherectomy/pta) - 1 vessel runoff via diff dzs peroneal; b. 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   Pneumonia    "once or twice" (11/26/2018)   Poorly controlled type 2 diabetes mellitus ([name])    Renal disorder    Renal insufficiency    a. 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   Sleep apnea    "couldn't handle wearing the mask" (11/26/2018)   Symptomatic bradycardia  a. Avoid AV blocking agent per EP. Prev req temp wire in 2017.   Tobacco abuse     Medications:  Scheduled:   arformoterol  15 mcg Nebulization BID   budesonide (PULMICORT) nebulizer solution  0.5 mg Nebulization BID   Chlorhexidine Gluconate Cloth  6 each Topical Daily   hydrALAZINE  50 mg Oral BID   insulin aspart  0-20 Units Subcutaneous Q4H   insulin glargine-yfgn  30 Units Subcutaneous Daily   levothyroxine  50 mcg Oral QAC breakfast   revefenacin  175 mcg Nebulization Daily   rosuvastatin  40 mg Oral Daily   Infusions:   sodium chloride 100 mL/hr at 07/28/23 1400   piperacillin-tazobactam (ZOSYN)  IV 3.375 g (07/29/23 0556)    Assessment: 62 y.o. F presents with. On Eliquis pta for afib and was continued in hospital. Last dose 9/13 2130. To transition to heparin for angiogram early next week. Venous duplex of RLE negative for  DVT. Eliquis will be affecting heparin levels so will utilize aPTT for monitoring until levels correlate.  Goal of Therapy:  Heparin level 0.3-0.7 units/ml aPTT 66-102 seconds Monitor platelets by anticoagulation protocol: Yes   Plan:  At 1000, start heparin 1350 units/hr Will f/u 8hr aPTT and heparin level Daily aPTT, heparin level and CBC  Christoper Fabian, PharmD, BCPS Please see amion for complete clinical pharmacist phone list 07/29/2023,7:04 AM

## 2023-07-29 NOTE — Progress Notes (Signed)
ANTICOAGULATION CONSULT NOTE    Pharmacy Consult for Eliquis ->Heparin Indication:  PAF (holding Eliquis for  angiogram  Allergies  Allergen Reactions   Gabapentin Nausea And Vomiting and Other (See Comments)    POSSIBLE SHAKING   Lyrica [Pregabalin] Other (See Comments)    Shaking     Patient Measurements: Height: 5\' 4"  (162.6 cm) Weight: 101.3 kg (223 lb 5.2 oz) IBW/kg (Calculated) : 54.7 Heparin Dosing Weight: 88 kg  Vital Signs: Temp: 98.4 F (36.9 C) (09/14 1548) Temp Source: Oral (09/14 1548) BP: 130/60 (09/14 1548) Pulse Rate: 89 (09/14 1548)  Labs: Recent Labs    07/27/23 1039 07/27/23 1059 07/27/23 1417 07/27/23 1930 07/28/23 1408 07/29/23 0723 07/29/23 1755  HGB 10.2*   < > 10.9*  --  8.3* 8.6*  --   HCT 34.1*   < > 32.0*  --  27.6* 28.6*  --   PLT 271  --   --   --  228 209  --   APTT  --   --   --   --   --   --  43*  HEPARINUNFRC  --   --   --   --   --   --  >1.10*  CREATININE 2.68*  --   --  2.08* 1.51* 1.39*  --    < > = values in this interval not displayed.    Estimated Creatinine Clearance: 49.8 mL/min (A) (by C-G formula based on SCr of 1.39 mg/dL (H)).   Medical History: Past Medical History:  Diagnosis Date   Alcohol abuse    Alcoholic cirrhosis ([name])    Anemia    Anxiety    Arthritis    "knees" (11/26/2018)   B12 deficiency    CAD (coronary artery disease)    a. 11/10/2014 Cath: LM nl, LAD min irregs, D1 30 ost, D2 50d, LCX 34m, OM1 80 p/m (1.5 mm vessel), OM2 21m, RCA nondom 68m-->med rx.. Demand ischemia in the setting of rapid a-fib.   Cardiomyopathy ([name])    Carotid artery disease ([name])    a. 01/2015 Carotid Angio: RICA 100, LICA 95p; b. 02/2015 s/p L CEA; c. 05/2019 Carotid U/S: RICA 100. RECA >50. LICA 1-39%.   Cellulitis    lower extremities   CHF (congestive heart failure) ([name])    Chronic combined systolic and diastolic CHF (congestive heart failure) ([name])    a. 10/2014 Echo: EF 40-45%; b. 10/2018 Echo: EF 45-50%, gr2 DD;  c. 11/2019 Echo: EF 50%, mild LVH, gr2 DD (restrictive), antlat HK, Nl RV fxn. Mild BAE. RVSP 59.72mmHg.   CKD (chronic kidney disease), stage III ([name])    Cocaine abuse ([name])    COPD (chronic obstructive pulmonary disease) ([name])    Critical lower limb ischemia ([name])    Depression    Diabetes mellitus without complication ([name])    Diabetic peripheral neuropathy ([name])    DVT (deep venous thrombosis) ([name])    Dyspnea    Elevated troponin    a. Chronic elevation.   GERD (gastroesophageal reflux disease)    Hyperlipemia    Hypertension    Hypokalemia    Hypomagnesemia    Hypothyroidism    Marijuana abuse    Narcotic abuse ([name])    Noncompliance    NSVT (nonsustained ventricular tachycardia) ([name])    Obesity    PAF (paroxysmal atrial fibrillation) ([name])    Paroxysmal atrial tachycardia    Peripheral arterial disease ([name])    a. 01/2015 Angio/PTA: RSFA 99 (  atherectomy/pta) - 1 vessel runoff via diff dzs peroneal; b. 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   Pneumonia    "once or twice" (11/26/2018)   Poorly controlled type 2 diabetes mellitus ([name])    Renal disorder    Renal insufficiency    a. 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   Sleep apnea    "couldn't handle wearing the mask" (11/26/2018)   Symptomatic bradycardia    a. Avoid AV blocking agent per EP. Prev req temp wire in 2017.   Tobacco abuse     Medications:  Scheduled:   arformoterol  15 mcg Nebulization BID   budesonide (PULMICORT) nebulizer solution  0.5 mg Nebulization BID   Chlorhexidine Gluconate Cloth  6 each Topical Daily   hydrALAZINE  50 mg Oral BID   insulin aspart  0-20 Units Subcutaneous Q4H   insulin glargine-yfgn  30 Units Subcutaneous Daily   levothyroxine  50 mcg Oral QAC breakfast   revefenacin  175 mcg Nebulization Daily   rosuvastatin  40 mg Oral Daily   torsemide  50 mg Oral BID   Infusions:   heparin 1,350 Units/hr (07/29/23 0815)   piperacillin-tazobactam (ZOSYN)  IV 3.375 g  (07/29/23 1312)    Assessment: 62 y.o. F presents with. On Eliquis pta for afib and was continued in hospital. Last dose 9/13 2130. To transition to heparin for angiogram early next week. Venous duplex of RLE negative for DVT. Eliquis will be affecting heparin levels so will utilize aPTT for monitoring until levels correlate.  Heparin level >1.10 and aptt 43 not correlating, no issues with infusion or overt s/sx of bleeding per RN.   Goal of Therapy:  Heparin level 0.3-0.7 units/ml aPTT 66-102 seconds Monitor platelets by anticoagulation protocol: Yes   Plan:  Increase heparin infusion to 1650 units/hr Will f/u 8hr aPTT and heparin level Daily aPTT, heparin level and CBC  Ruben Im, PharmD Clinical Pharmacist 07/29/2023 8:18 PM Please check AMION for all [facility] Pharmacy numbers

## 2023-07-29 NOTE — Progress Notes (Signed)
PROGRESS NOTE    [name]  QIO:[id number] DOB: 16-Oct-1961 DOA: 07/27/2023 PCP: [name], MD    Brief Narrative:   [name] is a 62 y.o. female with past medical history significant for combined chronic systolic and diastolic congestive heart failure, HTN, PAD s/p right TMA, CAD, type 2 diabetes mellitus, COPD, CKD stage IIIb, gout, right foot ulcer, carotid stenosis s/p left CEA, paroxysmal atrial fibrillation on Eliquis, tobacco use disorder who presented to [facility] ED on 9/12 via EMS from home with complaints of confusion, weakness, lethargy, decreased urine output, and high blood sugar.  On arrival to the ED, patient was noted to be short of breath, hypoxic and placed on BiPAP.  Temperature 99.0 F, HR 87, RR 28, BP 115/60, SpO2 100% on BiPAP with FiO2 40%.  VBG with pH 3.372, pCO2 27.9, PaO2 70.  Sodium 133, potassium 5.0, chloride 102, CO2 17, glucose 340, BUN 82, creatinine 2.68.  AST 39, ALT 30, total bilirubin 0.4.  Magnesium 2.3.  WBC 24.1, hemoglobin 10.2, platelet count 271.  Urinalysis with moderate leukocytes, positive nitrite, many bacteria, 21-50 WBCs.  TSH 1.038.  Chest x-ray with no active cardiopulmonary disease process, hypoinflation of lungs.  Blood cultures x 2 obtained.  Patient was started on empiric antibiotics and continued on BiPAP.  Given her concern for decompensation, patient was admitted to the pulmonary critical care service for concerns of severe sepsis secondary to urinary tract infection, acute metabolic encephalopathy and respiratory failure requiring BiPAP.  Significant Hospital events: 9/12: Admit to ICU, IV fluids, IV antibiotics, BiPAP 9/13: Titrated off of BiPAP, remains on IV antibiotics, WBC count improving 9/14: Transferred to hospitalist service, diet advanced, holding Eliquis, starting heparin drip with plan for vascular surgery intervention this upcoming week  Assessment & Plan:   Acute metabolic encephalopathy, POA Patient presenting  to the ED with confusion and found to be altered and hypoxic.  Initially placed on BiPAP and urinalysis consistent with urinary tract infection; started on empiric antibiotics.  BiPAP was slowly weaned off.  Mental status appears now to be back at her baseline. -- Continue treatment as below  Severe sepsis, POA Urinary tract infection Patient presented to ED confused, decreased urine output.  WBC count 24.8.  Urinalysis with moderate leukocytes, positive nitrite, many bacteria, 21-50 WBCs.  TSH within normal limits chest x-ray with no active cardiopulmonary disease process.  MRSA PCR negative.  Unfortunately urine culture not sent on admission.  Review of EMR notable for previous E. coli, Klebsiella, Staphylococcus, Enterococcus UTI. -- WBC 24.1>19.0>15.6 -- Procalcitonin 24.17>21.13 -- Blood cultures x 2: No growth x 2 days -- Continue Zosyn 3.375 g IV every 8 hours -- CBC/procalcitonin daily  COPD exacerbation Resenting with respiratory distress, hypoxia.  Initially requiring BiPAP which is now titrated off. -- Brovana neb twice daily -- Pulmicort neb twice daily -- Yupelri neb daily  Peripheral vascular disease w/ Hx left common femoral to posterior tibial artery bypass PAD s/p right TMA Right foot ulcer Right lower extremity venous duplex ultrasound negative for DVT. -- Vascular surgery following, appreciate assistance -- Holding Eliquis, starting heparin drip --Crestor 40 mg p.o. daily -- Angiogram pending this upcoming week  Chronic combined systolic/diastolic congestive heart failure, compensated Essential hypertension TTE 03/02/2023 with LVEF 50-55%, LV with no regional wall motion abnormalities, indeterminate LV diastolic parameters, trivial MR, no aortic stenosis, IVC normal in size.  Follow-up with cardiology outpatient, advanced heart failure team Dr. Shirlee Latch.  Home regimen includes losartan 50 mg p.o.  PROGRESS NOTE    [name]  QIO:[id number] DOB: 16-Oct-1961 DOA: 07/27/2023 PCP: [name], MD    Brief Narrative:   [name] is a 62 y.o. female with past medical history significant for combined chronic systolic and diastolic congestive heart failure, HTN, PAD s/p right TMA, CAD, type 2 diabetes mellitus, COPD, CKD stage IIIb, gout, right foot ulcer, carotid stenosis s/p left CEA, paroxysmal atrial fibrillation on Eliquis, tobacco use disorder who presented to [facility] ED on 9/12 via EMS from home with complaints of confusion, weakness, lethargy, decreased urine output, and high blood sugar.  On arrival to the ED, patient was noted to be short of breath, hypoxic and placed on BiPAP.  Temperature 99.0 F, HR 87, RR 28, BP 115/60, SpO2 100% on BiPAP with FiO2 40%.  VBG with pH 3.372, pCO2 27.9, PaO2 70.  Sodium 133, potassium 5.0, chloride 102, CO2 17, glucose 340, BUN 82, creatinine 2.68.  AST 39, ALT 30, total bilirubin 0.4.  Magnesium 2.3.  WBC 24.1, hemoglobin 10.2, platelet count 271.  Urinalysis with moderate leukocytes, positive nitrite, many bacteria, 21-50 WBCs.  TSH 1.038.  Chest x-ray with no active cardiopulmonary disease process, hypoinflation of lungs.  Blood cultures x 2 obtained.  Patient was started on empiric antibiotics and continued on BiPAP.  Given her concern for decompensation, patient was admitted to the pulmonary critical care service for concerns of severe sepsis secondary to urinary tract infection, acute metabolic encephalopathy and respiratory failure requiring BiPAP.  Significant Hospital events: 9/12: Admit to ICU, IV fluids, IV antibiotics, BiPAP 9/13: Titrated off of BiPAP, remains on IV antibiotics, WBC count improving 9/14: Transferred to hospitalist service, diet advanced, holding Eliquis, starting heparin drip with plan for vascular surgery intervention this upcoming week  Assessment & Plan:   Acute metabolic encephalopathy, POA Patient presenting  to the ED with confusion and found to be altered and hypoxic.  Initially placed on BiPAP and urinalysis consistent with urinary tract infection; started on empiric antibiotics.  BiPAP was slowly weaned off.  Mental status appears now to be back at her baseline. -- Continue treatment as below  Severe sepsis, POA Urinary tract infection Patient presented to ED confused, decreased urine output.  WBC count 24.8.  Urinalysis with moderate leukocytes, positive nitrite, many bacteria, 21-50 WBCs.  TSH within normal limits chest x-ray with no active cardiopulmonary disease process.  MRSA PCR negative.  Unfortunately urine culture not sent on admission.  Review of EMR notable for previous E. coli, Klebsiella, Staphylococcus, Enterococcus UTI. -- WBC 24.1>19.0>15.6 -- Procalcitonin 24.17>21.13 -- Blood cultures x 2: No growth x 2 days -- Continue Zosyn 3.375 g IV every 8 hours -- CBC/procalcitonin daily  COPD exacerbation Resenting with respiratory distress, hypoxia.  Initially requiring BiPAP which is now titrated off. -- Brovana neb twice daily -- Pulmicort neb twice daily -- Yupelri neb daily  Peripheral vascular disease w/ Hx left common femoral to posterior tibial artery bypass PAD s/p right TMA Right foot ulcer Right lower extremity venous duplex ultrasound negative for DVT. -- Vascular surgery following, appreciate assistance -- Holding Eliquis, starting heparin drip --Crestor 40 mg p.o. daily -- Angiogram pending this upcoming week  Chronic combined systolic/diastolic congestive heart failure, compensated Essential hypertension TTE 03/02/2023 with LVEF 50-55%, LV with no regional wall motion abnormalities, indeterminate LV diastolic parameters, trivial MR, no aortic stenosis, IVC normal in size.  Follow-up with cardiology outpatient, advanced heart failure team Dr. Shirlee Latch.  Home regimen includes losartan 50 mg p.o.  PROGRESS NOTE    [name]  QIO:[id number] DOB: 16-Oct-1961 DOA: 07/27/2023 PCP: [name], MD    Brief Narrative:   [name] is a 62 y.o. female with past medical history significant for combined chronic systolic and diastolic congestive heart failure, HTN, PAD s/p right TMA, CAD, type 2 diabetes mellitus, COPD, CKD stage IIIb, gout, right foot ulcer, carotid stenosis s/p left CEA, paroxysmal atrial fibrillation on Eliquis, tobacco use disorder who presented to [facility] ED on 9/12 via EMS from home with complaints of confusion, weakness, lethargy, decreased urine output, and high blood sugar.  On arrival to the ED, patient was noted to be short of breath, hypoxic and placed on BiPAP.  Temperature 99.0 F, HR 87, RR 28, BP 115/60, SpO2 100% on BiPAP with FiO2 40%.  VBG with pH 3.372, pCO2 27.9, PaO2 70.  Sodium 133, potassium 5.0, chloride 102, CO2 17, glucose 340, BUN 82, creatinine 2.68.  AST 39, ALT 30, total bilirubin 0.4.  Magnesium 2.3.  WBC 24.1, hemoglobin 10.2, platelet count 271.  Urinalysis with moderate leukocytes, positive nitrite, many bacteria, 21-50 WBCs.  TSH 1.038.  Chest x-ray with no active cardiopulmonary disease process, hypoinflation of lungs.  Blood cultures x 2 obtained.  Patient was started on empiric antibiotics and continued on BiPAP.  Given her concern for decompensation, patient was admitted to the pulmonary critical care service for concerns of severe sepsis secondary to urinary tract infection, acute metabolic encephalopathy and respiratory failure requiring BiPAP.  Significant Hospital events: 9/12: Admit to ICU, IV fluids, IV antibiotics, BiPAP 9/13: Titrated off of BiPAP, remains on IV antibiotics, WBC count improving 9/14: Transferred to hospitalist service, diet advanced, holding Eliquis, starting heparin drip with plan for vascular surgery intervention this upcoming week  Assessment & Plan:   Acute metabolic encephalopathy, POA Patient presenting  to the ED with confusion and found to be altered and hypoxic.  Initially placed on BiPAP and urinalysis consistent with urinary tract infection; started on empiric antibiotics.  BiPAP was slowly weaned off.  Mental status appears now to be back at her baseline. -- Continue treatment as below  Severe sepsis, POA Urinary tract infection Patient presented to ED confused, decreased urine output.  WBC count 24.8.  Urinalysis with moderate leukocytes, positive nitrite, many bacteria, 21-50 WBCs.  TSH within normal limits chest x-ray with no active cardiopulmonary disease process.  MRSA PCR negative.  Unfortunately urine culture not sent on admission.  Review of EMR notable for previous E. coli, Klebsiella, Staphylococcus, Enterococcus UTI. -- WBC 24.1>19.0>15.6 -- Procalcitonin 24.17>21.13 -- Blood cultures x 2: No growth x 2 days -- Continue Zosyn 3.375 g IV every 8 hours -- CBC/procalcitonin daily  COPD exacerbation Resenting with respiratory distress, hypoxia.  Initially requiring BiPAP which is now titrated off. -- Brovana neb twice daily -- Pulmicort neb twice daily -- Yupelri neb daily  Peripheral vascular disease w/ Hx left common femoral to posterior tibial artery bypass PAD s/p right TMA Right foot ulcer Right lower extremity venous duplex ultrasound negative for DVT. -- Vascular surgery following, appreciate assistance -- Holding Eliquis, starting heparin drip --Crestor 40 mg p.o. daily -- Angiogram pending this upcoming week  Chronic combined systolic/diastolic congestive heart failure, compensated Essential hypertension TTE 03/02/2023 with LVEF 50-55%, LV with no regional wall motion abnormalities, indeterminate LV diastolic parameters, trivial MR, no aortic stenosis, IVC normal in size.  Follow-up with cardiology outpatient, advanced heart failure team Dr. Shirlee Latch.  Home regimen includes losartan 50 mg p.o.  blood received in culture bottles   Culture   Final    NO GROWTH 2 DAYS Performed at [facility], [address] [place], [place] [address]    Report Status PENDING  Incomplete  MRSA Next Gen by PCR, Nasal     Status: None   Collection Time: 07/27/23  6:05 PM   Specimen: Nasal Mucosa; Nasal Swab  Result Value Ref Range Status   MRSA by PCR Next Gen NOT DETECTED NOT DETECTED Final    Comment: (NOTE) The GeneXpert MRSA Assay (FDA approved for NASAL specimens only), is one component of a comprehensive MRSA colonization surveillance program. It is not intended to diagnose MRSA infection nor to guide or monitor treatment for MRSA infections. Test performance is not FDA approved in patients less than 73 years old. Performed at [facility], [address], [place], [place] [address]          Radiology Studies: VAS [place] LOWER EXTREMITY VENOUS (DVT)  Result Date: 07/28/2023  Lower Venous DVT Study Patient Name:  SACOYA LICHTI  Date of Exam:   07/28/2023 Medical Rec #: [id number]      Accession #:    [id number] Date of Birth: Jan 02, 1961     Patient Gender: F Patient Age:   87 years Exam Location:  [facility] Procedure:      VAS [place] LOWER EXTREMITY VENOUS (DVT) Referring Phys: Heath Lark  --------------------------------------------------------------------------------  Indications: Pain, and Swelling.  Limitations: Poor ultrasound/tissue interface and body habitus. Comparison Study: Previous exam on 04/21/22 was negative for DVT Performing Technologist: Ernestene Mention RVT, RDMS  Examination Guidelines: A complete evaluation includes B-mode imaging, spectral Doppler, color Doppler, and power Doppler as needed of all accessible portions of each vessel. Bilateral testing is considered an integral part of a complete examination. Limited examinations for reoccurring indications may be performed as noted. The reflux portion of the exam is performed with the patient in reverse Trendelenburg.  +--------+---------------+---------+-----------+----------+--------------------+ RIGHT   CompressibilityPhasicitySpontaneityPropertiesThrombus Aging       +--------+---------------+---------+-----------+----------+--------------------+ CFV     Full           Yes      Yes                                       +--------+---------------+---------+-----------+----------+--------------------+ SFJ     Full                                                              +--------+---------------+---------+-----------+----------+--------------------+ FV Prox Full           Yes      Yes                                       +--------+---------------+---------+-----------+----------+--------------------+ FV Mid  Full           Yes      Yes                                       +--------+---------------+---------+-----------+----------+--------------------+  PROGRESS NOTE    [name]  QIO:[id number] DOB: 16-Oct-1961 DOA: 07/27/2023 PCP: [name], MD    Brief Narrative:   [name] is a 62 y.o. female with past medical history significant for combined chronic systolic and diastolic congestive heart failure, HTN, PAD s/p right TMA, CAD, type 2 diabetes mellitus, COPD, CKD stage IIIb, gout, right foot ulcer, carotid stenosis s/p left CEA, paroxysmal atrial fibrillation on Eliquis, tobacco use disorder who presented to [facility] ED on 9/12 via EMS from home with complaints of confusion, weakness, lethargy, decreased urine output, and high blood sugar.  On arrival to the ED, patient was noted to be short of breath, hypoxic and placed on BiPAP.  Temperature 99.0 F, HR 87, RR 28, BP 115/60, SpO2 100% on BiPAP with FiO2 40%.  VBG with pH 3.372, pCO2 27.9, PaO2 70.  Sodium 133, potassium 5.0, chloride 102, CO2 17, glucose 340, BUN 82, creatinine 2.68.  AST 39, ALT 30, total bilirubin 0.4.  Magnesium 2.3.  WBC 24.1, hemoglobin 10.2, platelet count 271.  Urinalysis with moderate leukocytes, positive nitrite, many bacteria, 21-50 WBCs.  TSH 1.038.  Chest x-ray with no active cardiopulmonary disease process, hypoinflation of lungs.  Blood cultures x 2 obtained.  Patient was started on empiric antibiotics and continued on BiPAP.  Given her concern for decompensation, patient was admitted to the pulmonary critical care service for concerns of severe sepsis secondary to urinary tract infection, acute metabolic encephalopathy and respiratory failure requiring BiPAP.  Significant Hospital events: 9/12: Admit to ICU, IV fluids, IV antibiotics, BiPAP 9/13: Titrated off of BiPAP, remains on IV antibiotics, WBC count improving 9/14: Transferred to hospitalist service, diet advanced, holding Eliquis, starting heparin drip with plan for vascular surgery intervention this upcoming week  Assessment & Plan:   Acute metabolic encephalopathy, POA Patient presenting  to the ED with confusion and found to be altered and hypoxic.  Initially placed on BiPAP and urinalysis consistent with urinary tract infection; started on empiric antibiotics.  BiPAP was slowly weaned off.  Mental status appears now to be back at her baseline. -- Continue treatment as below  Severe sepsis, POA Urinary tract infection Patient presented to ED confused, decreased urine output.  WBC count 24.8.  Urinalysis with moderate leukocytes, positive nitrite, many bacteria, 21-50 WBCs.  TSH within normal limits chest x-ray with no active cardiopulmonary disease process.  MRSA PCR negative.  Unfortunately urine culture not sent on admission.  Review of EMR notable for previous E. coli, Klebsiella, Staphylococcus, Enterococcus UTI. -- WBC 24.1>19.0>15.6 -- Procalcitonin 24.17>21.13 -- Blood cultures x 2: No growth x 2 days -- Continue Zosyn 3.375 g IV every 8 hours -- CBC/procalcitonin daily  COPD exacerbation Resenting with respiratory distress, hypoxia.  Initially requiring BiPAP which is now titrated off. -- Brovana neb twice daily -- Pulmicort neb twice daily -- Yupelri neb daily  Peripheral vascular disease w/ Hx left common femoral to posterior tibial artery bypass PAD s/p right TMA Right foot ulcer Right lower extremity venous duplex ultrasound negative for DVT. -- Vascular surgery following, appreciate assistance -- Holding Eliquis, starting heparin drip --Crestor 40 mg p.o. daily -- Angiogram pending this upcoming week  Chronic combined systolic/diastolic congestive heart failure, compensated Essential hypertension TTE 03/02/2023 with LVEF 50-55%, LV with no regional wall motion abnormalities, indeterminate LV diastolic parameters, trivial MR, no aortic stenosis, IVC normal in size.  Follow-up with cardiology outpatient, advanced heart failure team Dr. Shirlee Latch.  Home regimen includes losartan 50 mg p.o.  blood received in culture bottles   Culture   Final    NO GROWTH 2 DAYS Performed at [facility], [address] [place], [place] [address]    Report Status PENDING  Incomplete  MRSA Next Gen by PCR, Nasal     Status: None   Collection Time: 07/27/23  6:05 PM   Specimen: Nasal Mucosa; Nasal Swab  Result Value Ref Range Status   MRSA by PCR Next Gen NOT DETECTED NOT DETECTED Final    Comment: (NOTE) The GeneXpert MRSA Assay (FDA approved for NASAL specimens only), is one component of a comprehensive MRSA colonization surveillance program. It is not intended to diagnose MRSA infection nor to guide or monitor treatment for MRSA infections. Test performance is not FDA approved in patients less than 73 years old. Performed at [facility], [address], [place], [place] [address]          Radiology Studies: VAS [place] LOWER EXTREMITY VENOUS (DVT)  Result Date: 07/28/2023  Lower Venous DVT Study Patient Name:  SACOYA LICHTI  Date of Exam:   07/28/2023 Medical Rec #: [id number]      Accession #:    [id number] Date of Birth: Jan 02, 1961     Patient Gender: F Patient Age:   87 years Exam Location:  [facility] Procedure:      VAS [place] LOWER EXTREMITY VENOUS (DVT) Referring Phys: Heath Lark  --------------------------------------------------------------------------------  Indications: Pain, and Swelling.  Limitations: Poor ultrasound/tissue interface and body habitus. Comparison Study: Previous exam on 04/21/22 was negative for DVT Performing Technologist: Ernestene Mention RVT, RDMS  Examination Guidelines: A complete evaluation includes B-mode imaging, spectral Doppler, color Doppler, and power Doppler as needed of all accessible portions of each vessel. Bilateral testing is considered an integral part of a complete examination. Limited examinations for reoccurring indications may be performed as noted. The reflux portion of the exam is performed with the patient in reverse Trendelenburg.  +--------+---------------+---------+-----------+----------+--------------------+ RIGHT   CompressibilityPhasicitySpontaneityPropertiesThrombus Aging       +--------+---------------+---------+-----------+----------+--------------------+ CFV     Full           Yes      Yes                                       +--------+---------------+---------+-----------+----------+--------------------+ SFJ     Full                                                              +--------+---------------+---------+-----------+----------+--------------------+ FV Prox Full           Yes      Yes                                       +--------+---------------+---------+-----------+----------+--------------------+ FV Mid  Full           Yes      Yes                                       +--------+---------------+---------+-----------+----------+--------------------+

## 2023-07-30 DIAGNOSIS — R6520 Severe sepsis without septic shock: Secondary | ICD-10-CM | POA: Diagnosis not present

## 2023-07-30 DIAGNOSIS — A419 Sepsis, unspecified organism: Secondary | ICD-10-CM | POA: Diagnosis not present

## 2023-07-30 LAB — APTT
aPTT: 57 s — ABNORMAL HIGH (ref 24–36)
aPTT: 51 s — ABNORMAL HIGH (ref 24–36)

## 2023-07-30 LAB — BASIC METABOLIC PANEL
Anion gap: 7 (ref 5–15)
BUN: 45 mg/dL — ABNORMAL HIGH (ref 6–20)
CO2: 19 mmol/L — ABNORMAL LOW (ref 22–32)
Calcium: 7.6 mg/dL — ABNORMAL LOW (ref 8.9–10.3)
Chloride: 108 mmol/L (ref 98–111)
Creatinine, Ser: 1.42 mg/dL — ABNORMAL HIGH (ref 0.44–1.00)
GFR, Estimated: 42 mL/min — ABNORMAL LOW (ref >60)
Glucose, Bld: 171 mg/dL — ABNORMAL HIGH (ref 70–99)
Potassium: 3.6 mmol/L (ref 3.5–5.1)
Sodium: 134 mmol/L — ABNORMAL LOW (ref 135–145)

## 2023-07-30 LAB — CBC
HCT: 26 % — ABNORMAL LOW (ref 36.0–46.0)
Hemoglobin: 8 g/dL — ABNORMAL LOW (ref 12.0–15.0)
MCH: 28.5 pg (ref 26.0–34.0)
MCHC: 30.8 g/dL (ref 30.0–36.0)
MCV: 92.5 fL (ref 80.0–100.0)
Platelets: 199 10*3/uL (ref 150–400)
RBC: 2.81 MIL/uL — ABNORMAL LOW (ref 3.87–5.11)
RDW: 14.3 % (ref 11.5–15.5)
WBC: 10.3 10*3/uL (ref 4.0–10.5)
nRBC: 0 % (ref 0.0–0.2)

## 2023-07-30 LAB — HEPARIN LEVEL (UNFRACTIONATED): Heparin Unfractionated: >1.1 [IU]/mL — ABNORMAL HIGH (ref 0.30–0.70)

## 2023-07-30 LAB — GLUCOSE, CAPILLARY
Glucose-Capillary: 146 mg/dL — ABNORMAL HIGH (ref 70–99)
Glucose-Capillary: 219 mg/dL — ABNORMAL HIGH (ref 70–99)
Glucose-Capillary: 236 mg/dL — ABNORMAL HIGH (ref 70–99)
Glucose-Capillary: 243 mg/dL — ABNORMAL HIGH (ref 70–99)
Glucose-Capillary: 262 mg/dL — ABNORMAL HIGH (ref 70–99)
Glucose-Capillary: 269 mg/dL — ABNORMAL HIGH (ref 70–99)

## 2023-07-30 LAB — PROCALCITONIN: Procalcitonin: 12.5 ng/mL

## 2023-07-30 LAB — MAGNESIUM: Magnesium: 2 mg/dL (ref 1.7–2.4)

## 2023-07-30 LAB — URIC ACID: Uric Acid, Serum: 7.2 mg/dL — ABNORMAL HIGH (ref 2.5–7.1)

## 2023-07-30 MED ORDER — OXYCODONE HCL 5 MG PO TABS
5.0000 mg | ORAL_TABLET | ORAL | Status: DC | PRN
  Administered 2023-07-30 – 2023-08-04 (×19): 5 mg via ORAL
  Filled 2023-07-30 (×19): qty 1

## 2023-07-30 MED ORDER — BUPROPION HCL ER (SR) 100 MG PO TB12
100.0000 mg | ORAL_TABLET | Freq: Every day | ORAL | Status: DC
  Administered 2023-07-30 – 2023-08-05 (×7): 100 mg via ORAL
  Filled 2023-07-30 (×8): qty 1

## 2023-07-30 MED ORDER — INSULIN ASPART 100 UNIT/ML IJ SOLN
5.0000 [IU] | Freq: Three times a day (TID) | INTRAMUSCULAR | Status: DC
  Administered 2023-07-30 – 2023-08-05 (×19): 5 [IU] via SUBCUTANEOUS

## 2023-07-30 MED ORDER — SODIUM CHLORIDE 0.9 % IV BOLUS
500.0000 mL | Freq: Once | INTRAVENOUS | Status: AC
  Administered 2023-07-30: 500 mL via INTRAVENOUS

## 2023-07-30 MED ORDER — INSULIN GLARGINE-YFGN 100 UNIT/ML ~~LOC~~ SOLN
35.0000 [IU] | Freq: Every day | SUBCUTANEOUS | Status: DC
  Administered 2023-07-30 – 2023-08-01 (×3): 35 [IU] via SUBCUTANEOUS
  Filled 2023-07-30 (×3): qty 0.35

## 2023-07-30 NOTE — Progress Notes (Signed)
[id number]    Report Status PENDING  Incomplete  Blood culture (routine x 2)     Status: None (Preliminary result)   Collection Time: 07/27/23 12:48 PM   Specimen: BLOOD  Result Value Ref Range Status   Specimen Description BLOOD SITE NOT SPECIFIED  Final   Special Requests   Final    BOTTLES DRAWN AEROBIC AND ANAEROBIC Blood Culture results may not be optimal due to an inadequate volume of blood received in culture bottles   Culture   Final    NO GROWTH 3 DAYS Performed at [facility], [address] [place], [place] [address]    Report Status PENDING  Incomplete  MRSA Next Gen by PCR, Nasal     Status: None   Collection Time: 07/27/23  6:05 PM   Specimen: Nasal Mucosa; Nasal Swab  Result Value Ref Range Status   MRSA by PCR Next Gen NOT DETECTED NOT DETECTED Final    Comment: (NOTE) The GeneXpert MRSA Assay (FDA approved for NASAL specimens only), is one component of a comprehensive MRSA colonization surveillance program. It is not intended to diagnose MRSA infection nor to guide or monitor treatment for MRSA infections. Test performance is not FDA approved in patients less than 71 years old. Performed at [facility], [address], [name], [place] [address]          Radiology Studies: VAS [place] LOWER EXTREMITY VENOUS (DVT)  Result Date:  07/28/2023  Lower Venous DVT Study Patient Name:  [name]  Date of Exam:   07/28/2023 Medical Rec #: [id number]      Accession #:    [id number] Date of Birth: [birth year]     Patient Gender: F Patient Age:   62 years Exam Location:  [facility] Procedure:      VAS [place] LOWER EXTREMITY VENOUS (DVT) Referring Phys: Heath Lark --------------------------------------------------------------------------------  Indications: Pain, and Swelling.  Limitations: Poor ultrasound/tissue interface and body habitus. Comparison Study: Previous exam on 04/21/22 was negative for DVT Performing Technologist: Ernestene Mention RVT, RDMS  Examination Guidelines: A complete evaluation includes B-mode imaging, spectral Doppler, color Doppler, and power Doppler as needed of all accessible portions of each vessel. Bilateral testing is considered an integral part of a complete examination. Limited examinations for reoccurring indications may be performed as noted. The reflux portion of the exam is performed with the patient in reverse Trendelenburg.  +--------+---------------+---------+-----------+----------+--------------------+ RIGHT   CompressibilityPhasicitySpontaneityPropertiesThrombus Aging       +--------+---------------+---------+-----------+----------+--------------------+ CFV     Full           Yes      Yes                                       +--------+---------------+---------+-----------+----------+--------------------+ SFJ     Full                                                              +--------+---------------+---------+-----------+----------+--------------------+ FV Prox Full           Yes      Yes                                       +--------+---------------+---------+-----------+----------+--------------------+  PROGRESS NOTE    KATARZYNA WENTE  LKG:[id number] DOB: Apr 07, 1961 DOA: 07/27/2023 PCP: [name], MD    Brief Narrative:   JOMANA DRAGOO is a 62 y.o. female with past medical history significant for combined chronic systolic and diastolic congestive heart failure, HTN, PAD s/p right TMA, CAD, type 2 diabetes mellitus, COPD, CKD stage IIIb, gout, right foot ulcer, carotid stenosis s/p left CEA, paroxysmal atrial fibrillation on Eliquis, tobacco use disorder who presented to [facility] ED on 9/12 via EMS from home with complaints of confusion, weakness, lethargy, decreased urine output, and high blood sugar.  On arrival to the ED, patient was noted to be short of breath, hypoxic and placed on BiPAP.  Temperature 99.0 F, HR 87, RR 28, BP 115/60, SpO2 100% on BiPAP with FiO2 40%.  VBG with pH 3.372, pCO2 27.9, PaO2 70.  Sodium 133, potassium 5.0, chloride 102, CO2 17, glucose 340, BUN 82, creatinine 2.68.  AST 39, ALT 30, total bilirubin 0.4.  Magnesium 2.3.  WBC 24.1, hemoglobin 10.2, platelet count 271.  Urinalysis with moderate leukocytes, positive nitrite, many bacteria, 21-50 WBCs.  TSH 1.038.  Chest x-ray with no active cardiopulmonary disease process, hypoinflation of lungs.  Blood cultures x 2 obtained.  Patient was started on empiric antibiotics and continued on BiPAP.  Given her concern for decompensation, patient was admitted to the pulmonary critical care service for concerns of severe sepsis secondary to urinary tract infection, acute metabolic encephalopathy and respiratory failure requiring BiPAP.  Significant Hospital events: 9/12: Admit to ICU, IV fluids, IV antibiotics, BiPAP 9/13: Titrated off of BiPAP, remains on IV antibiotics, WBC count improving 9/14: Transferred to hospitalist service, diet advanced, holding Eliquis, starting heparin drip with plan for vascular surgery intervention this upcoming week  Assessment & Plan:   Acute metabolic encephalopathy, POA Patient presenting  to the ED with confusion and found to be altered and hypoxic.  Initially placed on BiPAP and urinalysis consistent with urinary tract infection; started on empiric antibiotics.  BiPAP was slowly weaned off.  Mental status appears now to be back at her baseline. -- Continue treatment as below  Severe sepsis, POA Urinary tract infection Patient presented to ED confused, decreased urine output.  WBC count 24.8.  Urinalysis with moderate leukocytes, positive nitrite, many bacteria, 21-50 WBCs.  TSH within normal limits chest x-ray with no active cardiopulmonary disease process.  MRSA PCR negative.  Unfortunately urine culture not sent on admission.  Review of EMR notable for previous E. coli, Klebsiella, Staphylococcus, Enterococcus UTI. -- WBC 24.1>19.0>15.6>10.3 -- Procalcitonin 24.17>21.13>12.50 -- Blood cultures x 2: No growth x 2 days -- Continue Zosyn 3.375 g IV every 8 hours -- CBC/procalcitonin daily  COPD exacerbation Resenting with respiratory distress, hypoxia.  Initially requiring BiPAP which is now titrated off. -- Brovana neb twice daily -- Pulmicort neb twice daily -- Yupelri neb daily  Peripheral vascular disease w/ Hx left common femoral to posterior tibial artery bypass PAD s/p right TMA Right foot ulcer Right lower extremity venous duplex ultrasound negative for DVT. -- Vascular surgery following, appreciate assistance -- Holding Eliquis, on heparin drip -- Crestor 40 mg p.o. daily -- Angiogram pending this upcoming week; timing per vascular surgery  Chronic combined systolic/diastolic congestive heart failure, compensated Essential hypertension TTE 03/02/2023 with LVEF 50-55%, LV with no regional wall motion abnormalities, indeterminate LV diastolic parameters, trivial MR, no aortic stenosis, IVC normal in size.  Follow-up with cardiology outpatient, advanced heart failure team Dr. Shirlee Latch.  Home regimen  PROGRESS NOTE    KATARZYNA WENTE  LKG:[id number] DOB: Apr 07, 1961 DOA: 07/27/2023 PCP: [name], MD    Brief Narrative:   JOMANA DRAGOO is a 62 y.o. female with past medical history significant for combined chronic systolic and diastolic congestive heart failure, HTN, PAD s/p right TMA, CAD, type 2 diabetes mellitus, COPD, CKD stage IIIb, gout, right foot ulcer, carotid stenosis s/p left CEA, paroxysmal atrial fibrillation on Eliquis, tobacco use disorder who presented to [facility] ED on 9/12 via EMS from home with complaints of confusion, weakness, lethargy, decreased urine output, and high blood sugar.  On arrival to the ED, patient was noted to be short of breath, hypoxic and placed on BiPAP.  Temperature 99.0 F, HR 87, RR 28, BP 115/60, SpO2 100% on BiPAP with FiO2 40%.  VBG with pH 3.372, pCO2 27.9, PaO2 70.  Sodium 133, potassium 5.0, chloride 102, CO2 17, glucose 340, BUN 82, creatinine 2.68.  AST 39, ALT 30, total bilirubin 0.4.  Magnesium 2.3.  WBC 24.1, hemoglobin 10.2, platelet count 271.  Urinalysis with moderate leukocytes, positive nitrite, many bacteria, 21-50 WBCs.  TSH 1.038.  Chest x-ray with no active cardiopulmonary disease process, hypoinflation of lungs.  Blood cultures x 2 obtained.  Patient was started on empiric antibiotics and continued on BiPAP.  Given her concern for decompensation, patient was admitted to the pulmonary critical care service for concerns of severe sepsis secondary to urinary tract infection, acute metabolic encephalopathy and respiratory failure requiring BiPAP.  Significant Hospital events: 9/12: Admit to ICU, IV fluids, IV antibiotics, BiPAP 9/13: Titrated off of BiPAP, remains on IV antibiotics, WBC count improving 9/14: Transferred to hospitalist service, diet advanced, holding Eliquis, starting heparin drip with plan for vascular surgery intervention this upcoming week  Assessment & Plan:   Acute metabolic encephalopathy, POA Patient presenting  to the ED with confusion and found to be altered and hypoxic.  Initially placed on BiPAP and urinalysis consistent with urinary tract infection; started on empiric antibiotics.  BiPAP was slowly weaned off.  Mental status appears now to be back at her baseline. -- Continue treatment as below  Severe sepsis, POA Urinary tract infection Patient presented to ED confused, decreased urine output.  WBC count 24.8.  Urinalysis with moderate leukocytes, positive nitrite, many bacteria, 21-50 WBCs.  TSH within normal limits chest x-ray with no active cardiopulmonary disease process.  MRSA PCR negative.  Unfortunately urine culture not sent on admission.  Review of EMR notable for previous E. coli, Klebsiella, Staphylococcus, Enterococcus UTI. -- WBC 24.1>19.0>15.6>10.3 -- Procalcitonin 24.17>21.13>12.50 -- Blood cultures x 2: No growth x 2 days -- Continue Zosyn 3.375 g IV every 8 hours -- CBC/procalcitonin daily  COPD exacerbation Resenting with respiratory distress, hypoxia.  Initially requiring BiPAP which is now titrated off. -- Brovana neb twice daily -- Pulmicort neb twice daily -- Yupelri neb daily  Peripheral vascular disease w/ Hx left common femoral to posterior tibial artery bypass PAD s/p right TMA Right foot ulcer Right lower extremity venous duplex ultrasound negative for DVT. -- Vascular surgery following, appreciate assistance -- Holding Eliquis, on heparin drip -- Crestor 40 mg p.o. daily -- Angiogram pending this upcoming week; timing per vascular surgery  Chronic combined systolic/diastolic congestive heart failure, compensated Essential hypertension TTE 03/02/2023 with LVEF 50-55%, LV with no regional wall motion abnormalities, indeterminate LV diastolic parameters, trivial MR, no aortic stenosis, IVC normal in size.  Follow-up with cardiology outpatient, advanced heart failure team Dr. Shirlee Latch.  Home regimen  PROGRESS NOTE    KATARZYNA WENTE  LKG:[id number] DOB: Apr 07, 1961 DOA: 07/27/2023 PCP: [name], MD    Brief Narrative:   JOMANA DRAGOO is a 62 y.o. female with past medical history significant for combined chronic systolic and diastolic congestive heart failure, HTN, PAD s/p right TMA, CAD, type 2 diabetes mellitus, COPD, CKD stage IIIb, gout, right foot ulcer, carotid stenosis s/p left CEA, paroxysmal atrial fibrillation on Eliquis, tobacco use disorder who presented to [facility] ED on 9/12 via EMS from home with complaints of confusion, weakness, lethargy, decreased urine output, and high blood sugar.  On arrival to the ED, patient was noted to be short of breath, hypoxic and placed on BiPAP.  Temperature 99.0 F, HR 87, RR 28, BP 115/60, SpO2 100% on BiPAP with FiO2 40%.  VBG with pH 3.372, pCO2 27.9, PaO2 70.  Sodium 133, potassium 5.0, chloride 102, CO2 17, glucose 340, BUN 82, creatinine 2.68.  AST 39, ALT 30, total bilirubin 0.4.  Magnesium 2.3.  WBC 24.1, hemoglobin 10.2, platelet count 271.  Urinalysis with moderate leukocytes, positive nitrite, many bacteria, 21-50 WBCs.  TSH 1.038.  Chest x-ray with no active cardiopulmonary disease process, hypoinflation of lungs.  Blood cultures x 2 obtained.  Patient was started on empiric antibiotics and continued on BiPAP.  Given her concern for decompensation, patient was admitted to the pulmonary critical care service for concerns of severe sepsis secondary to urinary tract infection, acute metabolic encephalopathy and respiratory failure requiring BiPAP.  Significant Hospital events: 9/12: Admit to ICU, IV fluids, IV antibiotics, BiPAP 9/13: Titrated off of BiPAP, remains on IV antibiotics, WBC count improving 9/14: Transferred to hospitalist service, diet advanced, holding Eliquis, starting heparin drip with plan for vascular surgery intervention this upcoming week  Assessment & Plan:   Acute metabolic encephalopathy, POA Patient presenting  to the ED with confusion and found to be altered and hypoxic.  Initially placed on BiPAP and urinalysis consistent with urinary tract infection; started on empiric antibiotics.  BiPAP was slowly weaned off.  Mental status appears now to be back at her baseline. -- Continue treatment as below  Severe sepsis, POA Urinary tract infection Patient presented to ED confused, decreased urine output.  WBC count 24.8.  Urinalysis with moderate leukocytes, positive nitrite, many bacteria, 21-50 WBCs.  TSH within normal limits chest x-ray with no active cardiopulmonary disease process.  MRSA PCR negative.  Unfortunately urine culture not sent on admission.  Review of EMR notable for previous E. coli, Klebsiella, Staphylococcus, Enterococcus UTI. -- WBC 24.1>19.0>15.6>10.3 -- Procalcitonin 24.17>21.13>12.50 -- Blood cultures x 2: No growth x 2 days -- Continue Zosyn 3.375 g IV every 8 hours -- CBC/procalcitonin daily  COPD exacerbation Resenting with respiratory distress, hypoxia.  Initially requiring BiPAP which is now titrated off. -- Brovana neb twice daily -- Pulmicort neb twice daily -- Yupelri neb daily  Peripheral vascular disease w/ Hx left common femoral to posterior tibial artery bypass PAD s/p right TMA Right foot ulcer Right lower extremity venous duplex ultrasound negative for DVT. -- Vascular surgery following, appreciate assistance -- Holding Eliquis, on heparin drip -- Crestor 40 mg p.o. daily -- Angiogram pending this upcoming week; timing per vascular surgery  Chronic combined systolic/diastolic congestive heart failure, compensated Essential hypertension TTE 03/02/2023 with LVEF 50-55%, LV with no regional wall motion abnormalities, indeterminate LV diastolic parameters, trivial MR, no aortic stenosis, IVC normal in size.  Follow-up with cardiology outpatient, advanced heart failure team Dr. Shirlee Latch.  Home regimen  [id number]    Report Status PENDING  Incomplete  Blood culture (routine x 2)     Status: None (Preliminary result)   Collection Time: 07/27/23 12:48 PM   Specimen: BLOOD  Result Value Ref Range Status   Specimen Description BLOOD SITE NOT SPECIFIED  Final   Special Requests   Final    BOTTLES DRAWN AEROBIC AND ANAEROBIC Blood Culture results may not be optimal due to an inadequate volume of blood received in culture bottles   Culture   Final    NO GROWTH 3 DAYS Performed at [facility], [address] [place], [place] [address]    Report Status PENDING  Incomplete  MRSA Next Gen by PCR, Nasal     Status: None   Collection Time: 07/27/23  6:05 PM   Specimen: Nasal Mucosa; Nasal Swab  Result Value Ref Range Status   MRSA by PCR Next Gen NOT DETECTED NOT DETECTED Final    Comment: (NOTE) The GeneXpert MRSA Assay (FDA approved for NASAL specimens only), is one component of a comprehensive MRSA colonization surveillance program. It is not intended to diagnose MRSA infection nor to guide or monitor treatment for MRSA infections. Test performance is not FDA approved in patients less than 71 years old. Performed at [facility], [address], [name], [place] [address]          Radiology Studies: VAS [place] LOWER EXTREMITY VENOUS (DVT)  Result Date:  07/28/2023  Lower Venous DVT Study Patient Name:  [name]  Date of Exam:   07/28/2023 Medical Rec #: [id number]      Accession #:    [id number] Date of Birth: [birth year]     Patient Gender: F Patient Age:   62 years Exam Location:  [facility] Procedure:      VAS [place] LOWER EXTREMITY VENOUS (DVT) Referring Phys: Heath Lark --------------------------------------------------------------------------------  Indications: Pain, and Swelling.  Limitations: Poor ultrasound/tissue interface and body habitus. Comparison Study: Previous exam on 04/21/22 was negative for DVT Performing Technologist: Ernestene Mention RVT, RDMS  Examination Guidelines: A complete evaluation includes B-mode imaging, spectral Doppler, color Doppler, and power Doppler as needed of all accessible portions of each vessel. Bilateral testing is considered an integral part of a complete examination. Limited examinations for reoccurring indications may be performed as noted. The reflux portion of the exam is performed with the patient in reverse Trendelenburg.  +--------+---------------+---------+-----------+----------+--------------------+ RIGHT   CompressibilityPhasicitySpontaneityPropertiesThrombus Aging       +--------+---------------+---------+-----------+----------+--------------------+ CFV     Full           Yes      Yes                                       +--------+---------------+---------+-----------+----------+--------------------+ SFJ     Full                                                              +--------+---------------+---------+-----------+----------+--------------------+ FV Prox Full           Yes      Yes                                       +--------+---------------+---------+-----------+----------+--------------------+  [id number]    Report Status PENDING  Incomplete  Blood culture (routine x 2)     Status: None (Preliminary result)   Collection Time: 07/27/23 12:48 PM   Specimen: BLOOD  Result Value Ref Range Status   Specimen Description BLOOD SITE NOT SPECIFIED  Final   Special Requests   Final    BOTTLES DRAWN AEROBIC AND ANAEROBIC Blood Culture results may not be optimal due to an inadequate volume of blood received in culture bottles   Culture   Final    NO GROWTH 3 DAYS Performed at [facility], [address] [place], [place] [address]    Report Status PENDING  Incomplete  MRSA Next Gen by PCR, Nasal     Status: None   Collection Time: 07/27/23  6:05 PM   Specimen: Nasal Mucosa; Nasal Swab  Result Value Ref Range Status   MRSA by PCR Next Gen NOT DETECTED NOT DETECTED Final    Comment: (NOTE) The GeneXpert MRSA Assay (FDA approved for NASAL specimens only), is one component of a comprehensive MRSA colonization surveillance program. It is not intended to diagnose MRSA infection nor to guide or monitor treatment for MRSA infections. Test performance is not FDA approved in patients less than 71 years old. Performed at [facility], [address], [name], [place] [address]          Radiology Studies: VAS [place] LOWER EXTREMITY VENOUS (DVT)  Result Date:  07/28/2023  Lower Venous DVT Study Patient Name:  [name]  Date of Exam:   07/28/2023 Medical Rec #: [id number]      Accession #:    [id number] Date of Birth: [birth year]     Patient Gender: F Patient Age:   62 years Exam Location:  [facility] Procedure:      VAS [place] LOWER EXTREMITY VENOUS (DVT) Referring Phys: Heath Lark --------------------------------------------------------------------------------  Indications: Pain, and Swelling.  Limitations: Poor ultrasound/tissue interface and body habitus. Comparison Study: Previous exam on 04/21/22 was negative for DVT Performing Technologist: Ernestene Mention RVT, RDMS  Examination Guidelines: A complete evaluation includes B-mode imaging, spectral Doppler, color Doppler, and power Doppler as needed of all accessible portions of each vessel. Bilateral testing is considered an integral part of a complete examination. Limited examinations for reoccurring indications may be performed as noted. The reflux portion of the exam is performed with the patient in reverse Trendelenburg.  +--------+---------------+---------+-----------+----------+--------------------+ RIGHT   CompressibilityPhasicitySpontaneityPropertiesThrombus Aging       +--------+---------------+---------+-----------+----------+--------------------+ CFV     Full           Yes      Yes                                       +--------+---------------+---------+-----------+----------+--------------------+ SFJ     Full                                                              +--------+---------------+---------+-----------+----------+--------------------+ FV Prox Full           Yes      Yes                                       +--------+---------------+---------+-----------+----------+--------------------+

## 2023-07-30 NOTE — Plan of Care (Signed)
Problem: Health Behavior/Discharge Planning: Goal: Ability to manage health-related needs will improve Outcome: Progressing Pt admitted into the hospital for hyperglycemia/ DKA r/t urosepsis.  Upon arrival to the ED pt's CBG was 430.  Per MD's orders her BSBG is checked q4 hours.  She has insulin coverage both sliding scale and scheduled per MD's orders.          Problem: Clinical Measurements: Goal: Will remain free from infection Outcome: Progressing S/Sx of infection monitored and assessed q-shift.  Pt has remained afebrile thus far.  She was admitted into the hospital for hyperglycemia/ DKA r/t urosepsis.  Upon arrival to the ED pt's CBG was 430.  Per MD's orders her BSBG is checked q4 hours.  She has insulin coverage both sliding scale and scheduled per MD's orders.  Pt is also on IV abx per MD's orders.  This shift she was found to be hypotensive with BP of 88/52 manually.  Informed [place], MD via secure chat.  MD, [place] then ordered EKG and fluid bolus to infuse over 2 hours.     Problem: Clinical Measurements: Goal: Respiratory complications will improve Outcome: Progressing Respiratory status monitored and assessed q-shift.  Pt is on room air with PO2 at 97-98 and respiration rate of 16-18 breaths per minute.  Pt has not endorsed c/o SOB or DOE.  She is endorsing CP, dizziness and palpitation earlier. Pt has history of PAF hence forth why she is on a heparin infusion per MD's orders.  Informed [place], MD via secure chat.  MD, [place] then ordered EKG and fluid bolus to infuse over 2 hours.     Problem: Activity: Goal: Risk for activity intolerance will decrease Outcome: Progressing Pt is dependent of all her ADLs.  She is x1 assist OOB with RN staff  to ambulate in her room or to the chair.  PT and OT came to assess pt today.    Problem: Nutrition: Goal: Adequate nutrition will be maintained Outcome: Progressing Pt is on a heart healthy/ carb modified diet per MD's  orders.  She has been able to tolerate his diet w/o s/sx of n/v or abdominal pain/ distention.    Problem: Coping: Goal: Level of anxiety will decrease Outcome: Progressing Pt has endorsed c/o anxiety thus far.  Offered support and made presence known.         Problem: Clinical Measurements: Goal: Cardiovascular complication will be avoided Outcome: Progressing Pt was admitted into the hospital for hyperglycemia/ DKA r/t urosepsis.  Upon arrival to the ED pt's CBG was 430.  Per MD's orders her BSBG is checked q4 hours.  She has insulin coverage both sliding scale and scheduled per MD's orders.  Pt is also on IV abx per MD's orders.  This shift she was found to be hypotensive with BP of 88/52 manually.  Informed [place], MD via secure chat.  MD, [place] then ordered EKG and fluid bolus to infuse over 2 hours.     Problem: Elimination: Goal: Will not experience complications related to bowel motility Outcome: Progressing Pt is incontinent of both bowel and bladder.  She has not endorsed c/o constipation, dysuria or abdominal pain/ distention.  Pt is checked q2 hours for bowel incontinence.  Perineal care given promptly after each episode.  Pt has a female external catheter (purewick) to capture her urine.  Management of female external catheter (purewick) performed per [facility] policy, procedure and guidelines.          Problem: Pain Managment: Goal: General  experience of comfort will improve Outcome: Progressing Pt has endorsed c/o 7/10 chronic generalized pain describing it as a constant ache.  Reiterated pain scale so she could adequately rate her pain. Pt stated her pain goal this admission would be 0/10. Discussed nonpharmacological methods to help reduce s/sx of pain. Interventions given per pt's request and MD's orders.    Problem: Safety: Goal: Ability to remain free from injury will improve Outcome: Progressing Pt has remained free from falls thus far.  Instructed pt to utilize RN  call light for assistance.  Hourly rounds performed.  Bed alarm implemented to keep pt safe from falls.  Settings activated to third most sensitive mode.  Bed locked, in lowest position, with two upper side rails engaged.  Belongings and call light within reach.  Pt was observed OOB ambulating in his room with a steady gait.  Nonskid sock implemented.   Problem: Skin Integrity: Goal: Risk for impaired skin integrity will decrease Outcome: Progressing Skin integrity monitored and assessed q-shift.  Instructed pt to turn q2 hours to prevent further skin impairment.  Tubes and drains assessed for device related pressure sores.  She is incontinent of both bowel and bladder.    Pt is checked q2 hours for bowel incontinence.  Perineal care given promptly after each episode.  Pt has a female external catheter (purewick) to capture her urine.  Management of female external catheter (purewick) performed per [facility] policy, procedure and guidelines.  Dressing changes performed per MD's orders.

## 2023-07-30 NOTE — Progress Notes (Signed)
ANTICOAGULATION CONSULT NOTE    Pharmacy Consult for Eliquis ->Heparin Indication:  PAF (holding Eliquis for  angiogram  Allergies  Allergen Reactions   Gabapentin Nausea And Vomiting and Other (See Comments)    POSSIBLE SHAKING   Lyrica [Pregabalin] Other (See Comments)    Shaking     Patient Measurements: Height: 5\' 4"  (162.6 cm) Weight: 101.3 kg (223 lb 5.2 oz) IBW/kg (Calculated) : 54.7 Heparin Dosing Weight: 88 kg  Vital Signs: Temp: 99.8 F (37.7 C) (09/15 0406) Temp Source: Oral (09/15 0406) BP: 128/55 (09/15 0406) Pulse Rate: 75 (09/15 0406)  Labs: Recent Labs    07/28/23 1408 07/29/23 0723 07/29/23 1755 07/30/23 0643  HGB 8.3* 8.6*  --  8.0*  HCT 27.6* 28.6*  --  26.0*  PLT 228 209  --  199  APTT  --   --  43* 57*  HEPARINUNFRC  --   --  >1.10* >1.10*  CREATININE 1.51* 1.39*  --  1.42*    Estimated Creatinine Clearance: 48.8 mL/min (A) (by C-G formula based on SCr of 1.42 mg/dL (H)).   Medical History: Past Medical History:  Diagnosis Date   Alcohol abuse    Alcoholic cirrhosis ([name])    Anemia    Anxiety    Arthritis    "knees" (11/26/2018)   B12 deficiency    CAD (coronary artery disease)    a. 11/10/2014 Cath: LM nl, LAD min irregs, D1 30 ost, D2 50d, LCX 45m, OM1 80 p/m (1.5 mm vessel), OM2 9m, RCA nondom 32m-->med rx.. Demand ischemia in the setting of rapid a-fib.   Cardiomyopathy ([name])    Carotid artery disease ([name])    a. 01/2015 Carotid Angio: RICA 100, LICA 95p; b. 02/2015 s/p L CEA; c. 05/2019 Carotid U/S: RICA 100. RECA >50. LICA 1-39%.   Cellulitis    lower extremities   CHF (congestive heart failure) ([name])    Chronic combined systolic and diastolic CHF (congestive heart failure) ([name])    a. 10/2014 Echo: EF 40-45%; b. 10/2018 Echo: EF 45-50%, gr2 DD; c. 11/2019 Echo: EF 50%, mild LVH, gr2 DD (restrictive), antlat HK, Nl RV fxn. Mild BAE. RVSP 59.25mmHg.   CKD (chronic kidney disease), stage III ([name])    Cocaine abuse ([name])    COPD  (chronic obstructive pulmonary disease) ([name])    Critical lower limb ischemia ([name])    Depression    Diabetes mellitus without complication ([name])    Diabetic peripheral neuropathy ([name])    DVT (deep venous thrombosis) ([name])    Dyspnea    Elevated troponin    a. Chronic elevation.   GERD (gastroesophageal reflux disease)    Hyperlipemia    Hypertension    Hypokalemia    Hypomagnesemia    Hypothyroidism    Marijuana abuse    Narcotic abuse ([name])    Noncompliance    NSVT (nonsustained ventricular tachycardia) ([name])    Obesity    PAF (paroxysmal atrial fibrillation) ([name])    Paroxysmal atrial tachycardia    Peripheral arterial disease ([name])    a. 01/2015 Angio/PTA: RSFA 99 (atherectomy/pta) - 1 vessel runoff via diff dzs peroneal; b. 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   Pneumonia    "once or twice" (11/26/2018)   Poorly controlled type 2 diabetes mellitus ([name])    Renal disorder    Renal insufficiency    a. 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   Sleep apnea    "couldn't handle wearing the  mask" (11/26/2018)   Symptomatic bradycardia    a. Avoid AV blocking agent per EP. Prev req temp wire in 2017.   Tobacco abuse     Medications:  Scheduled:   arformoterol  15 mcg Nebulization BID   budesonide (PULMICORT) nebulizer solution  0.5 mg Nebulization BID   Chlorhexidine Gluconate Cloth  6 each Topical Daily   hydrALAZINE  50 mg Oral BID   insulin aspart  0-20 Units Subcutaneous Q4H   insulin aspart  5 Units Subcutaneous TID WC   insulin glargine-yfgn  35 Units Subcutaneous Daily   levothyroxine  50 mcg Oral QAC breakfast   revefenacin  175 mcg Nebulization Daily   rosuvastatin  40 mg Oral Daily   torsemide  50 mg Oral BID   Infusions:   heparin 1,650 Units/hr (07/30/23 0428)   piperacillin-tazobactam (ZOSYN)  IV 3.375 g (07/30/23 0552)    Assessment: 62 y.o. F presents with. On Eliquis pta for afib and was continued in hospital. Last dose 9/13 2130. To  transition to heparin for angiogram early next week. Venous duplex of RLE negative for DVT. Eliquis will be affecting heparin levels so will utilize aPTT for monitoring until levels correlate.  Heparin level >1.10 and aptt 57 not correlating, no issues with infusion or overt s/sx of bleeding per RN.   Goal of Therapy:  Heparin level 0.3-0.7 units/ml aPTT 66-102 seconds Monitor platelets by anticoagulation protocol: Yes   Plan:  Increase heparin infusion to 1750 units/hr Will f/u 8hr aPTT and heparin level Daily aPTT, heparin level and CBC  Jeanella Cara, PharmD, [facility] Clinical Pharmacist Please see AMION for all Pharmacists' Contact Phone Numbers 07/30/2023, 7:29 AM

## 2023-07-30 NOTE — Progress Notes (Signed)
Pharmacy Antibiotic Note  [name] is a 62 y.o. female admitted on 07/27/2023  presenting with AMS, concern for UTI.  Pharmacy has been consulted for zosyn dosing.  Patient with history of multiple UTIs.  WBC improving, afebrile  Plan: Continue Zosyn 3.375g IV every 8 hours (extended 4h infusion) Monitor renal function, Cx and clinical progression  Height: 5\' 4"  (162.6 cm) Weight: 101.3 kg (223 lb 5.2 oz) IBW/kg (Calculated) : 54.7  Temp (24hrs), Avg:99.1 F (37.3 C), Min:98.4 F (36.9 C), Max:99.8 F (37.7 C)  Recent Labs  Lab 07/27/23 1039 07/27/23 1418 07/27/23 1930 07/28/23 1408 07/29/23 0723 07/30/23 0643  WBC 24.1*  --   --  19.0* 15.6* 10.3  CREATININE 2.68*  --  2.08* 1.51* 1.39* 1.42*  LATICACIDVEN  --  1.4 1.5  --   --   --     Estimated Creatinine Clearance: 48.8 mL/min (A) (by C-G formula based on SCr of 1.42 mg/dL (H)).    Allergies  Allergen Reactions   Gabapentin Nausea And Vomiting and Other (See Comments)    POSSIBLE SHAKING   Lyrica [Pregabalin] Other (See Comments)    Shaking     Jeanella Cara, PharmD, [facility] Clinical Pharmacist Please see AMION for all Pharmacists' Contact Phone Numbers 07/30/2023, 7:36 AM

## 2023-07-30 NOTE — Progress Notes (Signed)
ANTICOAGULATION CONSULT NOTE    Pharmacy Consult for Eliquis ->Heparin Indication:  PAF (holding Eliquis for  angiogram  Allergies  Allergen Reactions   Gabapentin Nausea And Vomiting and Other (See Comments)    POSSIBLE SHAKING   Lyrica [Pregabalin] Other (See Comments)    Shaking     Patient Measurements: Height: 5\' 4"  (162.6 cm) Weight: 101.3 kg (223 lb 5.2 oz) IBW/kg (Calculated) : 54.7 Heparin Dosing Weight: 88 kg  Vital Signs: Temp: 98.3 F (36.8 C) (09/15 1621) Temp Source: Oral (09/15 1621) BP: 116/60 (09/15 1858) Pulse Rate: 69 (09/15 1625)  Labs: Recent Labs    07/28/23 1408 07/29/23 0723 07/29/23 1755 07/30/23 0643 07/30/23 1638  HGB 8.3* 8.6*  --  8.0*  --   HCT 27.6* 28.6*  --  26.0*  --   PLT 228 209  --  199  --   APTT  --   --  43* 57* 51*  HEPARINUNFRC  --   --  >1.10* >1.10*  --   CREATININE 1.51* 1.39*  --  1.42*  --     Estimated Creatinine Clearance: 48.8 mL/min (A) (by C-G formula based on SCr of 1.42 mg/dL (H)).   Medical History: Past Medical History:  Diagnosis Date   Alcohol abuse    Alcoholic cirrhosis ([name])    Anemia    Anxiety    Arthritis    "knees" (11/26/2018)   B12 deficiency    CAD (coronary artery disease)    a. 11/10/2014 Cath: LM nl, LAD min irregs, D1 30 ost, D2 50d, LCX 33m, OM1 80 p/m (1.5 mm vessel), OM2 56m, RCA nondom 74m-->med rx.. Demand ischemia in the setting of rapid a-fib.   Cardiomyopathy ([name])    Carotid artery disease ([name])    a. 01/2015 Carotid Angio: RICA 100, LICA 95p; b. 02/2015 s/p L CEA; c. 05/2019 Carotid U/S: RICA 100. RECA >50. LICA 1-39%.   Cellulitis    lower extremities   CHF (congestive heart failure) ([name])    Chronic combined systolic and diastolic CHF (congestive heart failure) ([name])    a. 10/2014 Echo: EF 40-45%; b. 10/2018 Echo: EF 45-50%, gr2 DD; c. 11/2019 Echo: EF 50%, mild LVH, gr2 DD (restrictive), antlat HK, Nl RV fxn. Mild BAE. RVSP 59.24mmHg.   CKD (chronic kidney disease), stage  III ([name])    Cocaine abuse ([name])    COPD (chronic obstructive pulmonary disease) ([name])    Critical lower limb ischemia ([name])    Depression    Diabetes mellitus without complication ([name])    Diabetic peripheral neuropathy ([name])    DVT (deep venous thrombosis) ([name])    Dyspnea    Elevated troponin    a. Chronic elevation.   GERD (gastroesophageal reflux disease)    Hyperlipemia    Hypertension    Hypokalemia    Hypomagnesemia    Hypothyroidism    Marijuana abuse    Narcotic abuse ([name])    Noncompliance    NSVT (nonsustained ventricular tachycardia) ([name])    Obesity    PAF (paroxysmal atrial fibrillation) ([name])    Paroxysmal atrial tachycardia    Peripheral arterial disease ([name])    a. 01/2015 Angio/PTA: RSFA 99 (atherectomy/pta) - 1 vessel runoff via diff dzs peroneal; b. 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   Pneumonia    "once or twice" (11/26/2018)   Poorly controlled type 2 diabetes mellitus ([name])    Renal disorder    Renal insufficiency  a. 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   Sleep apnea    "couldn't handle wearing the mask" (11/26/2018)   Symptomatic bradycardia    a. Avoid AV blocking agent per EP. Prev req temp wire in 2017.   Tobacco abuse     Medications:  Scheduled:   arformoterol  15 mcg Nebulization BID   budesonide (PULMICORT) nebulizer solution  0.5 mg Nebulization BID   buPROPion ER  100 mg Oral Daily   Chlorhexidine Gluconate Cloth  6 each Topical Daily   insulin aspart  0-20 Units Subcutaneous Q4H   insulin aspart  5 Units Subcutaneous TID WC   insulin glargine-yfgn  35 Units Subcutaneous Daily   levothyroxine  50 mcg Oral QAC breakfast   revefenacin  175 mcg Nebulization Daily   rosuvastatin  40 mg Oral Daily   Infusions:   heparin 1,750 Units/hr (07/30/23 1639)   piperacillin-tazobactam (ZOSYN)  IV 3.375 g (07/30/23 1331)    Assessment: 62 y.o. F presents with. On Eliquis pta for afib and was continued in hospital. Last dose  9/13 2130. To transition to heparin for angiogram early next week. Venous duplex of RLE negative for DVT. Eliquis will be affecting heparin levels so will utilize aPTT for monitoring until levels correlate.  aptt 51 on 1750 units/hr, no issues with infusion or overt s/sx of bleeding per RN.   Goal of Therapy:  Heparin level 0.3-0.7 units/ml aPTT 66-102 seconds Monitor platelets by anticoagulation protocol: Yes   Plan:  Increase heparin infusion to 1950 units/hr Will f/u 8hr aPTT and heparin level Daily aPTT, heparin level and CBC  Ruben Im, PharmD Clinical Pharmacist 07/30/2023 7:20 PM Please check AMION for all [facility] numbers

## 2023-07-30 NOTE — Progress Notes (Addendum)
PT Cancellation Note  Patient Details Name: [name] MRN: [id number] DOB: Jan 08, 1961   Cancelled Treatment:    Reason Eval/Treat Not Completed: (P) Patient not medically ready. RN reporting pt is currently hypotensive and EKG findings indicate potential ischemia, requesting PT to hold off at this time. Will plan to follow-up tomorrow as able. Of note, inadvertently acknowledged PT Eval orders.   Virgil Benedict, PT, DPT Acute Rehabilitation Services  Office: [phone]    [name] 07/30/2023, 4:47 PM

## 2023-07-30 NOTE — Evaluation (Signed)
Occupational Therapy Evaluation Patient Details Name: [name] MRN: [id number] DOB: 1961/01/16 Today's Date: 07/30/2023   History of Present Illness Pt presenting 9/12 with elevated blood sugar. PMH significant for afib on xarelto, DM, HTN, PAD s/p R TMA, COPD, CKD stage IIIb, gout, right foot ulcer, carotid stenosis.   Clinical Impression   PTA, pt lived with sister who assisted with cooking and cleaning. Upon eval, pt performing UB ADL with set-up and LB ADL with min A due to difficulty reaching feet. Pt needing min cues for safety during transfers and management of RW. Pt at increased risk for falls secondary to decr safety. Verbose about living situation during session. Pt to benefit from continued OT acutely and follow up HHOT for initial home safety eval to optimize safety and independence in the home setting.       If plan is discharge home, recommend the following: A little help with walking and/or transfers;A little help with bathing/dressing/bathroom;Help with stairs or ramp for entrance;Assist for transportation;Assistance with cooking/housework    Functional Status Assessment  Patient has had a recent decline in their functional status and demonstrates the ability to make significant improvements in function in a reasonable and predictable amount of time.  Equipment Recommendations  None recommended by OT    Recommendations for Other Services       Precautions / Restrictions Precautions Precautions: Fall Restrictions Weight Bearing Restrictions: No      Mobility Bed Mobility               General bed mobility comments: OOB in recliner    Transfers Overall transfer level: Needs assistance Equipment used: Rolling walker (2 wheels) Transfers: Sit to/from Stand Sit to Stand: Contact guard assist           General transfer comment: CGA for safety      Balance Overall balance assessment: Needs assistance Sitting-balance support: No upper extremity  supported, Feet supported Sitting balance-Leahy Scale: Good     Standing balance support: Bilateral upper extremity supported, No upper extremity supported, During functional activity Standing balance-Leahy Scale: Poor Standing balance comment: reliant on RW                           ADL either performed or assessed with clinical judgement   ADL Overall ADL's : Needs assistance/impaired Eating/Feeding: Independent   Grooming: Contact guard assist;Standing   Upper Body Bathing: Set up;Sitting   Lower Body Bathing: Minimal assistance;Sit to/from stand   Upper Body Dressing : Set up;Sitting   Lower Body Dressing: Minimal assistance;Sit to/from stand Lower Body Dressing Details (indicate cue type and reason): to don new brief Toilet Transfer: Contact guard assist;Rolling walker (2 wheels);Ambulation;Regular Toilet;Grab bars   Toileting- Clothing Manipulation and Hygiene: Contact guard assist;Sit to/from stand;Sitting/lateral lean       Functional mobility during ADLs: Contact guard assist;Rolling walker (2 wheels) General ADL Comments: CGA in small spaces due to decr safety awarebess     Vision Baseline Vision/History: 1 Wears glasses;5 Retinopathy Ability to See in Adequate Light: 0 Adequate Patient Visual Report: No change from baseline Additional Comments: Pt wears glasses per her report and states that she has diabetic retinopathy     Perception Perception: Not tested       Praxis Praxis: Not tested       Pertinent Vitals/Pain Pain Assessment Pain Assessment: Faces Faces Pain Scale: Hurts little more Pain Location: BLE, R hand Pain Descriptors / Indicators: Sore Pain  Intervention(s): Limited activity within patient's tolerance, Monitored during session     Extremity/Trunk Assessment Upper Extremity Assessment Upper Extremity Assessment: Generalized weakness;Left hand dominant   Lower Extremity Assessment Lower Extremity Assessment: Defer to PT  evaluation   Cervical / Trunk Assessment Cervical / Trunk Assessment: Kyphotic   Communication Communication Communication: No apparent difficulties   Cognition Arousal: Alert Behavior During Therapy: WFL for tasks assessed/performed Overall Cognitive Status: No family/caregiver present to determine baseline cognitive functioning                                 General Comments: Pt oriented and pleasant. needing min cues for safety and RW mananement in small spaces in room. Suspect near baseline function. Pt does easily get anxious and benefits from cues for breathing techniques     General Comments       Exercises     Shoulder Instructions      Home Living Family/patient expects to be discharged to:: Private residence Living Arrangements: Other (Comment) (sister) Available Help at Discharge: Family;Available 24 hours/day Type of Home: Apartment Home Access: Stairs to enter Entrance Stairs-Number of Steps: 3-4 Entrance Stairs-Rails: None Home Layout: One level     Bathroom Shower/Tub: Chief Strategy Officer: Standard     Home Equipment: Rollator (4 wheels);Shower seat;Cane - Programmer, applications (2 wheels);Hand held shower head   Additional Comments: Pt lives with sister      Prior Functioning/Environment Prior Level of Function : Needs assist             Mobility Comments: rollator in the house and outside ADLs Comments: sister cooks and cleans; pt typically able to perform ADL mod I        OT Problem List: Decreased strength;Impaired balance (sitting and/or standing);Decreased activity tolerance;Decreased knowledge of precautions      OT Treatment/Interventions: Self-care/ADL training;Therapeutic exercise;DME and/or AE instruction;Balance training;Patient/family education;Therapeutic activities    OT Goals(Current goals can be found in the care plan section) Acute Rehab OT Goals Patient Stated Goal: get better OT Goal  Formulation: With patient Time For Goal Achievement: 08/13/23 Potential to Achieve Goals: Good  OT Frequency: Min 1X/week    Co-evaluation              AM-PAC OT "6 Clicks" Daily Activity     Outcome Measure Help from another person eating meals?: None Help from another person taking care of personal grooming?: A Little Help from another person toileting, which includes using toliet, bedpan, or urinal?: A Little Help from another person bathing (including washing, rinsing, drying)?: A Little Help from another person to put on and taking off regular upper body clothing?: A Little Help from another person to put on and taking off regular lower body clothing?: A Little 6 Click Score: 19   End of Session Equipment Utilized During Treatment: Gait belt;Rolling walker (2 wheels) Nurse Communication: Mobility status  Activity Tolerance: Patient tolerated treatment well Patient left: with call bell/phone within reach;in chair  OT Visit Diagnosis: Unsteadiness on feet (R26.81);Muscle weakness (generalized) (M62.81);Other symptoms and signs involving cognitive function                Time: 1532-1550 OT Time Calculation (min): 18 min Charges:  OT General Charges $OT Visit: 1 Visit OT Evaluation $OT Eval Low Complexity: 1 Low  Tyler Deis, OTR/L [facility] Acute Rehabilitation Office: [phone]   Myrla Halsted 07/30/2023, 4:42 PM

## 2023-07-31 DIAGNOSIS — R6520 Severe sepsis without septic shock: Secondary | ICD-10-CM | POA: Diagnosis not present

## 2023-07-31 DIAGNOSIS — A419 Sepsis, unspecified organism: Secondary | ICD-10-CM | POA: Diagnosis not present

## 2023-07-31 LAB — APTT
aPTT: >200 s (ref 24–36)
aPTT: >200 s (ref 24–36)
aPTT: 59 s — ABNORMAL HIGH (ref 24–36)
aPTT: 71 s — ABNORMAL HIGH (ref 24–36)

## 2023-07-31 LAB — BASIC METABOLIC PANEL
Anion gap: 10 (ref 5–15)
BUN: 43 mg/dL — ABNORMAL HIGH (ref 6–20)
CO2: 20 mmol/L — ABNORMAL LOW (ref 22–32)
Calcium: 8 mg/dL — ABNORMAL LOW (ref 8.9–10.3)
Chloride: 107 mmol/L (ref 98–111)
Creatinine, Ser: 1.46 mg/dL — ABNORMAL HIGH (ref 0.44–1.00)
GFR, Estimated: 41 mL/min — ABNORMAL LOW (ref >60)
Glucose, Bld: 141 mg/dL — ABNORMAL HIGH (ref 70–99)
Potassium: 3.7 mmol/L (ref 3.5–5.1)
Sodium: 137 mmol/L (ref 135–145)

## 2023-07-31 LAB — GLUCOSE, CAPILLARY
Glucose-Capillary: 101 mg/dL — ABNORMAL HIGH (ref 70–99)
Glucose-Capillary: 171 mg/dL — ABNORMAL HIGH (ref 70–99)
Glucose-Capillary: 173 mg/dL — ABNORMAL HIGH (ref 70–99)
Glucose-Capillary: 199 mg/dL — ABNORMAL HIGH (ref 70–99)
Glucose-Capillary: 219 mg/dL — ABNORMAL HIGH (ref 70–99)
Glucose-Capillary: 219 mg/dL — ABNORMAL HIGH (ref 70–99)

## 2023-07-31 LAB — CBC
HCT: 28.3 % — ABNORMAL LOW (ref 36.0–46.0)
Hemoglobin: 8.5 g/dL — ABNORMAL LOW (ref 12.0–15.0)
MCH: 27.4 pg (ref 26.0–34.0)
MCHC: 30 g/dL (ref 30.0–36.0)
MCV: 91.3 fL (ref 80.0–100.0)
Platelets: 210 10*3/uL (ref 150–400)
RBC: 3.1 MIL/uL — ABNORMAL LOW (ref 3.87–5.11)
RDW: 14.3 % (ref 11.5–15.5)
WBC: 11.8 10*3/uL — ABNORMAL HIGH (ref 4.0–10.5)
nRBC: 0.2 % (ref 0.0–0.2)

## 2023-07-31 LAB — HEPARIN LEVEL (UNFRACTIONATED): Heparin Unfractionated: >1.1 [IU]/mL — ABNORMAL HIGH (ref 0.30–0.70)

## 2023-07-31 LAB — PROCALCITONIN: Procalcitonin: 6.47 ng/mL

## 2023-07-31 MED ORDER — METOPROLOL TARTRATE 12.5 MG HALF TABLET
12.5000 mg | ORAL_TABLET | Freq: Two times a day (BID) | ORAL | Status: DC
  Administered 2023-07-31 – 2023-08-05 (×11): 12.5 mg via ORAL
  Filled 2023-07-31 (×11): qty 1

## 2023-07-31 MED ORDER — HEPARIN BOLUS VIA INFUSION
1250.0000 [IU] | Freq: Once | INTRAVENOUS | Status: AC
  Administered 2023-07-31: 1250 [IU] via INTRAVENOUS
  Filled 2023-07-31: qty 1250

## 2023-07-31 MED ORDER — TORSEMIDE 20 MG PO TABS
50.0000 mg | ORAL_TABLET | Freq: Every day | ORAL | Status: DC
  Administered 2023-07-31 – 2023-08-01 (×2): 50 mg via ORAL
  Filled 2023-07-31 (×2): qty 3

## 2023-07-31 NOTE — Progress Notes (Signed)
ANTICOAGULATION CONSULT NOTE    Pharmacy Consult for Eliquis ->Heparin Indication:  PAF (holding Eliquis for  angiogram  Allergies  Allergen Reactions   Gabapentin Nausea And Vomiting and Other (See Comments)    POSSIBLE SHAKING   Lyrica [Pregabalin] Other (See Comments)    Shaking     Patient Measurements: Height: 5\' 4"  (162.6 cm) Weight: 103.6 kg (228 lb 6.3 oz) IBW/kg (Calculated) : 54.7 Heparin Dosing Weight: 88 kg  Vital Signs: Temp: 99.4 F (37.4 C) (09/16 2021) Temp Source: Oral (09/16 2021) BP: 147/58 (09/16 2021) Pulse Rate: 65 (09/16 2021)  Labs: Recent Labs    07/29/23 0723 07/29/23 0723 07/29/23 1755 07/30/23 0643 07/30/23 1638 07/31/23 0717 07/31/23 0935 07/31/23 1413 07/31/23 2110  HGB 8.6*  --   --  8.0*  --  8.5*  --   --   --   HCT 28.6*  --   --  26.0*  --  28.3*  --   --   --   PLT 209  --   --  199  --  210  --   --   --   APTT  --    < > 43* 57*   < > >200* >200* 59* 71*  HEPARINUNFRC  --   --  >1.10* >1.10*  --  >1.10*  --   --   --   CREATININE 1.39*  --   --  1.42*  --  1.46*  --   --   --    < > = values in this interval not displayed.    Estimated Creatinine Clearance: 48.1 mL/min (A) (by C-G formula based on SCr of 1.46 mg/dL (H)).   Medical History: Past Medical History:  Diagnosis Date   Alcohol abuse    Alcoholic cirrhosis ([name])    Anemia    Anxiety    Arthritis    "knees" (11/26/2018)   B12 deficiency    CAD (coronary artery disease)    a. 11/10/2014 Cath: LM nl, LAD min irregs, D1 30 ost, D2 50d, LCX 71m, OM1 80 p/m (1.5 mm vessel), OM2 2m, RCA nondom 63m-->med rx.. Demand ischemia in the setting of rapid a-fib.   Cardiomyopathy ([name])    Carotid artery disease ([name])    a. 01/2015 Carotid Angio: RICA 100, LICA 95p; b. 02/2015 s/p L CEA; c. 05/2019 Carotid U/S: RICA 100. RECA >50. LICA 1-39%.   Cellulitis    lower extremities   CHF (congestive heart failure) ([name])    Chronic combined systolic and diastolic CHF (congestive  heart failure) ([name])    a. 10/2014 Echo: EF 40-45%; b. 10/2018 Echo: EF 45-50%, gr2 DD; c. 11/2019 Echo: EF 50%, mild LVH, gr2 DD (restrictive), antlat HK, Nl RV fxn. Mild BAE. RVSP 59.61mmHg.   CKD (chronic kidney disease), stage III ([name])    Cocaine abuse ([name])    COPD (chronic obstructive pulmonary disease) ([name])    Critical lower limb ischemia ([name])    Depression    Diabetes mellitus without complication ([name])    Diabetic peripheral neuropathy ([name])    DVT (deep venous thrombosis) ([name])    Dyspnea    Elevated troponin    a. Chronic elevation.   GERD (gastroesophageal reflux disease)    Hyperlipemia    Hypertension    Hypokalemia    Hypomagnesemia    Hypothyroidism    Marijuana abuse    Narcotic abuse ([name])    Noncompliance    NSVT (nonsustained ventricular tachycardia) ([name])  Obesity    PAF (paroxysmal atrial fibrillation) ([name])    Paroxysmal atrial tachycardia    Peripheral arterial disease ([name])    a. 01/2015 Angio/PTA: RSFA 99 (atherectomy/pta) - 1 vessel runoff via diff dzs peroneal; b. 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   Pneumonia    "once or twice" (11/26/2018)   Poorly controlled type 2 diabetes mellitus ([name])    Renal disorder    Renal insufficiency    a. 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   Sleep apnea    "couldn't handle wearing the mask" (11/26/2018)   Symptomatic bradycardia    a. Avoid AV blocking agent per EP. Prev req temp wire in 2017.   Tobacco abuse     Medications:  Scheduled:   arformoterol  15 mcg Nebulization BID   budesonide (PULMICORT) nebulizer solution  0.5 mg Nebulization BID   buPROPion ER  100 mg Oral Daily   Chlorhexidine Gluconate Cloth  6 each Topical Daily   insulin aspart  0-20 Units Subcutaneous Q4H   insulin aspart  5 Units Subcutaneous TID WC   insulin glargine-yfgn  35 Units Subcutaneous Daily   levothyroxine  50 mcg Oral QAC breakfast   metoprolol tartrate  12.5 mg Oral BID   revefenacin  175 mcg Nebulization  Daily   rosuvastatin  40 mg Oral Daily   torsemide  50 mg Oral Daily   Infusions:   heparin 2,150 Units/hr (07/31/23 1817)   piperacillin-tazobactam (ZOSYN)  IV 3.375 g (07/31/23 2101)    Assessment: 62 y.o. F presents with. On Eliquis pta for afib and was continued in hospital. Last dose 9/13 2130. To transition to heparin for angiogram early next week. Venous duplex of RLE negative for DVT. Eliquis will be affecting heparin levels so will utilize aPTT for monitoring until levels correlate.  9/16 AM: APTT remains subtherapeutic at 59 seconds with heparin running at 1,950 units/hour despite rate increase. Previous aPTT values> 200s were due to incorrect collection. No issues with heparin infusion or signs of bleeding noted. CBC stable.   9/16 PM: aPTT 71 (therapeutic) on heparin 2150 units/hr - verified with RN that level was drawn from opposite arm in which heparin is infusing. No bleeding issues.  Goal of Therapy:  Heparin level 0.3-0.7 units/ml aPTT 66-102 seconds Monitor platelets by anticoagulation protocol: Yes   Plan:  Continue heparin infusion to 2,150 units/hr Daily aPTT, heparin level and CBC  Loralee Pacas, PharmD, BCPS 07/31/2023 10:43 PM   Please check AMION for all [facility] Pharmacy phone numbers After 10:00 PM, call Main Pharmacy [phone]

## 2023-07-31 NOTE — Care Management Important Message (Signed)
Important Message  Patient Details  Name: [name] MRN: [id number] Date of Birth: [birth year]   Medicare Important Message Given:  Yes     Deanndra Kirley Stefan Church 07/31/2023, 4:31 PM

## 2023-07-31 NOTE — Progress Notes (Signed)
PROGRESS NOTE    Karla PATIENCE  Price:[id number] DOB: [birth year] DOA: 07/27/2023 PCP: [name], MD    Brief Narrative:   [name] is a 62 y.o. female with past medical history significant for combined chronic systolic and diastolic congestive heart failure, HTN, PAD s/p right TMA, CAD, type 2 diabetes mellitus, COPD, CKD stage IIIb, gout, right foot ulcer, carotid stenosis s/p left CEA, paroxysmal atrial fibrillation on Eliquis, tobacco use disorder who presented to [facility] ED on 9/12 via EMS from home with complaints of confusion, weakness, lethargy, decreased urine output, and high blood sugar.  On arrival to the ED, patient was noted to be short of breath, hypoxic and placed on BiPAP.  Temperature 99.0 F, HR 87, RR 28, BP 115/60, SpO2 100% on BiPAP with FiO2 40%.  VBG with pH 3.372, pCO2 27.9, PaO2 70.  Sodium 133, potassium 5.0, chloride 102, CO2 17, glucose 340, BUN 82, creatinine 2.68.  AST 39, ALT 30, total bilirubin 0.4.  Magnesium 2.3.  WBC 24.1, hemoglobin 10.2, platelet count 271.  Urinalysis with moderate leukocytes, positive nitrite, many bacteria, 21-50 WBCs.  TSH 1.038.  Chest x-ray with no active cardiopulmonary disease process, hypoinflation of lungs.  Blood cultures x 2 obtained.  Patient was started on empiric antibiotics and continued on BiPAP.  Given her concern for decompensation, patient was admitted to the pulmonary critical care service for concerns of severe sepsis secondary to urinary tract infection, acute metabolic encephalopathy and respiratory failure requiring BiPAP.  Significant Hospital events: 9/12: Admit to ICU, IV fluids, IV antibiotics, BiPAP 9/13: Titrated off of BiPAP, remains on IV antibiotics, WBC count improving 9/14: Transferred to hospitalist service, diet advanced, holding Eliquis, starting heparin drip with plan for vascular surgery intervention this upcoming week  Assessment & Plan:   Acute metabolic encephalopathy, POA Patient presenting  to the ED with confusion and found to be altered and hypoxic.  Initially placed on BiPAP and urinalysis consistent with urinary tract infection; started on empiric antibiotics.  BiPAP was slowly weaned off.  Mental status appears now to be back at her baseline. -- Continue treatment as below  Severe sepsis, POA Urinary tract infection Patient presented to ED confused, decreased urine output.  WBC count 24.8.  Urinalysis with moderate leukocytes, positive nitrite, many bacteria, 21-50 WBCs.  TSH within normal limits chest x-ray with no active cardiopulmonary disease process.  MRSA PCR negative.  Unfortunately urine culture not sent on admission.  Review of EMR notable for previous E. coli, Klebsiella, Staphylococcus, Enterococcus UTI. -- WBC 24.1>19.0>15.6>10.3>11.6 -- Procalcitonin 24.17>21.13>12.50>6.47 -- Blood cultures x 2: No growth x 4 days -- Continue Zosyn 3.375 g IV every 8 hours, plan 5-day course -- CBC/procalcitonin daily  COPD exacerbation Resenting with respiratory distress, hypoxia.  Initially requiring BiPAP which is now titrated off. -- Brovana neb twice daily -- Pulmicort neb twice daily -- Yupelri neb daily  Peripheral vascular disease w/ Hx left common femoral to posterior tibial artery bypass PAD s/p right TMA Right foot ulcer Right lower extremity venous duplex ultrasound negative for DVT. -- Vascular surgery following, appreciate assistance -- Holding Eliquis, on heparin drip; pharmacy consulted for dosing/monitoring -- Crestor 40 mg p.o. daily -- Angiogram pending this week; timing per vascular surgery  Chronic combined systolic/diastolic congestive heart failure, compensated Essential hypertension TTE 03/02/2023 with LVEF 50-55%, LV with no regional wall motion abnormalities, indeterminate LV diastolic parameters, trivial MR, no aortic stenosis, IVC normal in size.  Follow-up with cardiology outpatient, advanced heart failure  team Dr. Shirlee Latch.  Home regimen includes  losartan 50 mg p.o. daily, spironolactone 12.5 mg p.o. daily, torsemide 100 mg p.o. twice daily. -- Resume torsemide at reduced dose 50 mg p.o. daily -- Continue to hold home losartan and spironolactone for now -- Fluid restrict 1500 mL/day -- Strict I's and O's and daily weights -- Follow BMP daily  Type 2 diabetes mellitus with hyperglycemia Hemoglobin A1c 11.1, poorly controlled.  Home regimen includes Lantus 38 units subcutaneously daily, Humalog 10 units 3 times daily AC, Jardiance. -- Semglee 35u [place] daily -- Novolog 5u TIDAC (if eating >50% of meals; hold if NPO) -- SSI for coverage -- CBGs qAC/HS  Hypothyroidism TSH 1.038, within normal limits. -- Levothyroxine 50 mcg p.o. daily  Paroxysmal atrial fibrillation on anticoagulation Not on any rate controlling medications at baseline. -- Started on metoprolol tartrate 12.5 mg p.o. twice daily -- Eliquis on hold as above, starting heparin drip  Acute renal failure on CKD stage IIIb Baseline creatinine 1.6-1.7. -- Cr 2.68>2.08>1.51>1.39>1.42>1.46 -- BMP daily  Anemia of chronic medical/renal disease Hemoglobin 8.5, stable. -- CBC daily  Tobacco use disorder Counseled on need for complete cessation/abstinence  Obesity: Body mass index is 39.2 kg/m.  Complicates all facets of care   DVT prophylaxis: Heparin drip    Code Status: Full Code Family Communication: No family present at bedside this morning  Disposition Plan:  Level of care: Med-Surg Status is: Inpatient Remains inpatient appropriate because: IV antibiotics, heparin drip, pending vascular surgery intervention with angiogram this week    Consultants:  PCCM Vascular surgery  Procedures:  Right lower extremity venous duplex ultrasound  Antimicrobials:  Zosyn 9/12>> Vancomycin 9/12 - 9/12 Cefepime 9/12 - 9/12   Subjective: Patient seen examined bedside, resting calmly.  Lying in bed.  Complains of dizziness yesterday, was noted to be in A-fib with  RVR now resolved after IV fluid resuscitation.  Will reduce torsemide to 50 mg p.o. daily and add beta-blockade, as patient not on any rate controlling medicines outpatient.  Continues with pain to her lower extremities with edema, not significantly changed.  Otherwise no other specific complaints or concerns at this time.  Awaiting vascular surgery to determine timing of arteriogram.   Denies headache, no current dizziness, no chest pain, no palpitations, no shortness of breath, no abdominal pain, no fever/chills/night sweats, no nausea cefonicid diarrhea, no focal weakness, fatigue, no paresthesias.  No acute events overnight per nursing staff.  Objective: Vitals:   07/30/23 2050 07/31/23 0500 07/31/23 0530 07/31/23 0751  BP: (!) 152/67  (!) 147/65 (!) 145/68  Pulse: 79  70 77  Resp: 18  19 18   Temp: 98.9 F (37.2 C)  98.8 F (37.1 C) 98.9 F (37.2 C)  TempSrc: Oral  Oral Oral  SpO2: 100%  96% 98%  Weight:  103.6 kg    Height:        Intake/Output Summary (Last 24 hours) at 07/31/2023 1033 Last data filed at 07/31/2023 0815 Gross per 24 hour  Intake 862.22 ml  Output --  Net 862.22 ml   Filed Weights   07/29/23 0500 07/30/23 0500 07/31/23 0500  Weight: 101.3 kg 101.3 kg 103.6 kg    Examination:  Physical Exam: GEN: NAD, alert and oriented x 3, chronically ill appearance, appears older than stated age, obese HEENT: NCAT, PERRL, EOMI, sclera clear, MMM PULM: Breath sounds diminished bilateral bases, no wheezes/crackles, normal respiratory effort without accessory muscle use, on room air CV: RRR w/o M/G/R GI: abd soft,  NTND, NABS, no R/G/M MSK: 2+ pitting edema bilateral lower extremities to knee, noted right TMA with ulcer, moves all extremities independently  NEURO: No focal neurological deficit PSYCH: normal mood/affect Integumentary: Right foot plantar ulcer as above with chronic venous changes anterior aspect bilateral lower extremities, otherwise no other concerning  rashes/lesions/wounds noted on exposed skin surfaces     Data Reviewed: I have personally reviewed following labs and imaging studies  CBC: Recent Labs  Lab 07/27/23 1039 07/27/23 1059 07/27/23 1417 07/28/23 1408 07/29/23 0723 07/30/23 0643 07/31/23 0717  WBC 24.1*  --   --  19.0* 15.6* 10.3 11.8*  HGB 10.2*   < > 10.9* 8.3* 8.6* 8.0* 8.5*  HCT 34.1*   < > 32.0* 27.6* 28.6* 26.0* 28.3*  MCV 93.4  --   --  92.3 95.7 92.5 91.3  PLT 271  --   --  228 209 199 210   < > = values in this interval not displayed.   Basic Metabolic Panel: Recent Labs  Lab 07/27/23 1039 07/27/23 1059 07/27/23 1930 07/28/23 1408 07/29/23 0723 07/30/23 0643 07/31/23 0717  NA 133*   < > 136 139 139 134* 137  K 5.0   < > 4.7 4.0 4.2 3.6 3.7  CL 102  --  105 110 111 108 107  CO2 17*  --  18* 21* 20* 19* 20*  GLUCOSE 340*  --  325* 208* 127* 171* 141*  BUN 82*  --  70* 55* 47* 45* 43*  CREATININE 2.68*  --  2.08* 1.51* 1.39* 1.42* 1.46*  CALCIUM 8.5*  --  8.5* 8.4* 8.0* 7.6* 8.0*  MG 2.3  --   --   --   --  2.0  --    < > = values in this interval not displayed.   GFR: Estimated Creatinine Clearance: 48.1 mL/min (A) (by C-G formula based on SCr of 1.46 mg/dL (H)). Liver Function Tests: Recent Labs  Lab 07/27/23 1039 07/27/23 1930  AST 39 26  ALT 30 27  ALKPHOS 172* 154*  BILITOT 0.4 0.3  PROT 7.1 7.0  ALBUMIN 3.1* 2.9*   No results for input(s): "LIPASE", "AMYLASE" in the last 168 hours. No results for input(s): "AMMONIA" in the last 168 hours. Coagulation Profile: No results for input(s): "INR", "PROTIME" in the last 168 hours. Cardiac Enzymes: No results for input(s): "CKTOTAL", "CKMB", "CKMBINDEX", "TROPONINI" in the last 168 hours. BNP (last 3 results) No results for input(s): "PROBNP" in the last 8760 hours. HbA1C: No results for input(s): "HGBA1C" in the last 72 hours.  CBG: Recent Labs  Lab 07/30/23 1625 07/30/23 2052 07/31/23 0018 07/31/23 0532 07/31/23 0752   GLUCAP 269* 243* 199* 101* 219*   Lipid Profile: No results for input(s): "CHOL", "HDL", "LDLCALC", "TRIG", "CHOLHDL", "LDLDIRECT" in the last 72 hours. Thyroid Function Tests: No results for input(s): "TSH", "T4TOTAL", "FREET4", "T3FREE", "THYROIDAB" in the last 72 hours.  Anemia Panel: No results for input(s): "VITAMINB12", "FOLATE", "FERRITIN", "TIBC", "IRON", "RETICCTPCT" in the last 72 hours. Sepsis Labs: Recent Labs  Lab 07/27/23 1418 07/27/23 1710 07/27/23 1930 07/29/23 0723 07/30/23 0643 07/31/23 0717  PROCALCITON  --  24.17  --  21.13 12.50 6.47  LATICACIDVEN 1.4  --  1.5  --   --   --     Recent Results (from the past 240 hour(s))  Blood culture (routine x 2)     Status: None (Preliminary result)   Collection Time: 07/27/23 12:43 PM   Specimen: BLOOD  Result Value Ref Range Status   Specimen Description BLOOD SITE NOT SPECIFIED  Final   Special Requests   Final    BOTTLES DRAWN AEROBIC AND ANAEROBIC Blood Culture results may not be optimal due to an inadequate volume of blood received in culture bottles   Culture   Final    NO GROWTH 4 DAYS Performed at [facility], [address] [place], [place] [address]    Report Status PENDING  Incomplete  Blood culture (routine x 2)     Status: None (Preliminary result)   Collection Time: 07/27/23 12:48 PM   Specimen: BLOOD  Result Value Ref Range Status   Specimen Description BLOOD SITE NOT SPECIFIED  Final   Special Requests   Final    BOTTLES DRAWN AEROBIC AND ANAEROBIC Blood Culture results may not be optimal due to an inadequate volume of blood received in culture bottles   Culture   Final    NO GROWTH 4 DAYS Performed at [facility], [address], [place], [place] [address]    Report Status PENDING  Incomplete  MRSA Next Gen by PCR, Nasal     Status: None   Collection Time: 07/27/23  6:05 PM   Specimen: Nasal Mucosa; Nasal Swab  Result Value Ref Range Status   MRSA by PCR Next Gen NOT  DETECTED NOT DETECTED Final    Comment: (NOTE) The GeneXpert MRSA Assay (FDA approved for NASAL specimens only), is one component of a comprehensive MRSA colonization surveillance program. It is not intended to diagnose MRSA infection nor to guide or monitor treatment for MRSA infections. Test performance is not FDA approved in patients less than 32 years old. Performed at [facility], [address] [place], [place] [address]          Radiology Studies: No results found.      Scheduled Meds:  arformoterol  15 mcg Nebulization BID   budesonide (PULMICORT) nebulizer solution  0.5 mg Nebulization BID   buPROPion ER  100 mg Oral Daily   Chlorhexidine Gluconate Cloth  6 each Topical Daily   insulin aspart  0-20 Units Subcutaneous Q4H   insulin aspart  5 Units Subcutaneous TID WC   insulin glargine-yfgn  35 Units Subcutaneous Daily   levothyroxine  50 mcg Oral QAC breakfast   metoprolol tartrate  12.5 mg Oral BID   revefenacin  175 mcg Nebulization Daily   rosuvastatin  40 mg Oral Daily   torsemide  50 mg Oral Daily   Continuous Infusions:  heparin 1,950 Units/hr (07/31/23 0623)   piperacillin-tazobactam (ZOSYN)  IV 12.5 mL/hr at 07/31/23 0623     LOS: 4 days    Time spent: 51 minutes spent on chart review, discussion with nursing staff, consultants, updating family and interview/physical exam; more than 50% of that time was spent in counseling and/or coordination of care.    Alvira Philips [place], DO [facility] Available via Epic secure chat 7am-7pm After these hours, please refer to coverage provider listed on [email or web address] 07/31/2023, 10:33 AM

## 2023-07-31 NOTE — Progress Notes (Signed)
ANTICOAGULATION CONSULT NOTE    Pharmacy Consult for Eliquis ->Heparin Indication:  PAF (holding Eliquis for  angiogram  Allergies  Allergen Reactions   Gabapentin Nausea And Vomiting and Other (See Comments)    POSSIBLE SHAKING   Lyrica [Pregabalin] Other (See Comments)    Shaking     Patient Measurements: Height: 5\' 4"  (162.6 cm) Weight: 103.6 kg (228 lb 6.3 oz) IBW/kg (Calculated) : 54.7 Heparin Dosing Weight: 88 kg  Vital Signs: Temp: 98.9 F (37.2 C) (09/16 0751) Temp Source: Oral (09/16 0751) BP: 145/68 (09/16 0751) Pulse Rate: 77 (09/16 0751)  Labs: Recent Labs    07/29/23 0723 07/29/23 0723 07/29/23 1755 07/30/23 0643 07/30/23 1638 07/31/23 0717 07/31/23 0935 07/31/23 1413  HGB 8.6*  --   --  8.0*  --  8.5*  --   --   HCT 28.6*  --   --  26.0*  --  28.3*  --   --   PLT 209  --   --  199  --  210  --   --   APTT  --    < > 43* 57*   < > >200* >200* 59*  HEPARINUNFRC  --   --  >1.10* >1.10*  --  >1.10*  --   --   CREATININE 1.39*  --   --  1.42*  --  1.46*  --   --    < > = values in this interval not displayed.    Estimated Creatinine Clearance: 48.1 mL/min (A) (by C-G formula based on SCr of 1.46 mg/dL (H)).   Medical History: Past Medical History:  Diagnosis Date   Alcohol abuse    Alcoholic cirrhosis ([name])    Anemia    Anxiety    Arthritis    "knees" (11/26/2018)   B12 deficiency    CAD (coronary artery disease)    a. 11/10/2014 Cath: LM nl, LAD min irregs, D1 30 ost, D2 50d, LCX 67m, OM1 80 p/m (1.5 mm vessel), OM2 86m, RCA nondom 27m-->med rx.. Demand ischemia in the setting of rapid a-fib.   Cardiomyopathy ([name])    Carotid artery disease ([name])    a. 01/2015 Carotid Angio: RICA 100, LICA 95p; b. 02/2015 s/p L CEA; c. 05/2019 Carotid U/S: RICA 100. RECA >50. LICA 1-39%.   Cellulitis    lower extremities   CHF (congestive heart failure) ([name])    Chronic combined systolic and diastolic CHF (congestive heart failure) ([name])    a. 10/2014 Echo:  EF 40-45%; b. 10/2018 Echo: EF 45-50%, gr2 DD; c. 11/2019 Echo: EF 50%, mild LVH, gr2 DD (restrictive), antlat HK, Nl RV fxn. Mild BAE. RVSP 59.49mmHg.   CKD (chronic kidney disease), stage III ([name])    Cocaine abuse ([name])    COPD (chronic obstructive pulmonary disease) ([name])    Critical lower limb ischemia ([name])    Depression    Diabetes mellitus without complication ([name])    Diabetic peripheral neuropathy ([name])    DVT (deep venous thrombosis) ([name])    Dyspnea    Elevated troponin    a. Chronic elevation.   GERD (gastroesophageal reflux disease)    Hyperlipemia    Hypertension    Hypokalemia    Hypomagnesemia    Hypothyroidism    Marijuana abuse    Narcotic abuse ([name])    Noncompliance    NSVT (nonsustained ventricular tachycardia) ([name])    Obesity    PAF (paroxysmal atrial fibrillation) ([name])    Paroxysmal atrial tachycardia  Peripheral arterial disease ([name])    a. 01/2015 Angio/PTA: RSFA 99 (atherectomy/pta) - 1 vessel runoff via diff dzs peroneal; b. 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   Pneumonia    "once or twice" (11/26/2018)   Poorly controlled type 2 diabetes mellitus ([name])    Renal disorder    Renal insufficiency    a. 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   Sleep apnea    "couldn't handle wearing the mask" (11/26/2018)   Symptomatic bradycardia    a. Avoid AV blocking agent per EP. Prev req temp wire in 2017.   Tobacco abuse     Medications:  Scheduled:   arformoterol  15 mcg Nebulization BID   budesonide (PULMICORT) nebulizer solution  0.5 mg Nebulization BID   buPROPion ER  100 mg Oral Daily   Chlorhexidine Gluconate Cloth  6 each Topical Daily   insulin aspart  0-20 Units Subcutaneous Q4H   insulin aspart  5 Units Subcutaneous TID WC   insulin glargine-yfgn  35 Units Subcutaneous Daily   levothyroxine  50 mcg Oral QAC breakfast   metoprolol tartrate  12.5 mg Oral BID   revefenacin  175 mcg Nebulization Daily   rosuvastatin  40 mg Oral Daily    torsemide  50 mg Oral Daily   Infusions:   heparin 1,950 Units/hr (07/31/23 1425)   piperacillin-tazobactam (ZOSYN)  IV 3.375 g (07/31/23 1424)    Assessment: 62 y.o. F presents with. On Eliquis pta for afib and was continued in hospital. Last dose 9/13 2130. To transition to heparin for angiogram early next week. Venous duplex of RLE negative for DVT. Eliquis will be affecting heparin levels so will utilize aPTT for monitoring until levels correlate.  9/16 AM: APTT remains subtherapeutic at 59 seconds with heparin running at 1,950 units/hour despite rate increase. Previous aPTT values> 200s were due to incorrect collection. No issues with heparin infusion or signs of bleeding noted. CBC stable.   Goal of Therapy:  Heparin level 0.3-0.7 units/ml aPTT 66-102 seconds Monitor platelets by anticoagulation protocol: Yes   Plan:  Increase heparin infusion to 2,150 units/hr Heparin bolus via infusion 1,250 units x1  Will f/u 6hr aPTT Daily aPTT, heparin level and CBC  Jani Gravel, PharmD Clinical Pharmacist  07/31/2023 3:01 PM

## 2023-07-31 NOTE — Progress Notes (Signed)
   07/31/23 1533  TOC Assessment  Once discharged, how will the patient get to their discharge location? Family/Friend - Photographer (Sister if her care is back from repairs / Niece as backup)  Expected Discharge Plan Home w Home Health Services  Barriers to Discharge Continued Medical Work up  Patient states their goals for this hospitalization and ongoing recovery are: go home  CMS Medicare.gov Compare Post Acute Care list provided to: Patient  Choice offered to / list presented to  Patient Iantha Fallen is choice)  Post Acute Care Choice Home Health  Living arrangements for the past 2 months Apartment  Lives with: Relatives;Friends (Friend, Marin Roberts and Billie's Grandson ( 18 y/o))  Current home services DME (4 prong cane ; rollator)  DME Arranged Wheelchair manual (Wheelchair cushion  added)  DME Agency [employer]  Date DME Agency Contacted 07/31/23  Time DME Agency Contacted [phone]  Representative spoke with at DME Agency Jermaine  HH Arranged OT;PT  [facility] Agency [facility]  Date [facility] Agency Contacted 07/31/23  Time [facility] Agency Contacted [phone]  Representative spoke with at [facility] Agency [name]  Do you feel safe going back to the place where you live? Y  Permission sought to share information with  Family Supports  Permission granted to share information with  Yes, Verbal Permission Granted  Share Information with NAME Friend, [name] and Niece, [employer] geologist granted to share info w AGENCY HH and DME agancies  Patient language and need for interpreter reviewed: Yes  Criminal Activity/Legal Involvement Pertinent to Current Situation/Hospitalization No - Comment as needed  Need for Family Participation in Patient Care N  Care giver support system in place? Y  Appearance: Appears stated age  Affect (typically observed) Pleasant  Alcohol / Substance Use Tobacco Use  Psych Involvement N   Initial Assessment.

## 2023-07-31 NOTE — Evaluation (Signed)
Physical Therapy Brief Evaluation and Discharge Note Patient Details Name: [name] MRN: [id number] DOB: [birth year] Today's Date: 07/31/2023   History of Present Illness  Pt is a 62 y/o F admitted on 07/27/23 after presenting with c/o confusion, weakness, lethargy, decreased urine output & high blood sugar. Pt admitted for severe sepsis 2/2 UTI, acute metabolic encephalopathy & respiratory failure requiring BiPAP. PMH: combined chronic systolic & diastolic CHF, HTN, PAD s/p R TMA, CAD, DM2, COPD, CKD 3B, gout, R foot ulcer, carotid stenosis s/p L CEA, paroxysmal a-fib on Eliquis, tobacco use disorder  Clinical Impression  Pt seen for PT evaluation with pt agreeable to tx. Pt with erythema in distal RLE & notes 10/10 pain but also reports she's been getting up to the bathroom this morning. Pt is able to don RLE post op shoe without assistance & perform STS & gait with RW in room with mod I. Overall gait distance limited by RLE pain. At this time, pt reports she's at baseline level of function, feels she could walk farther if she was not limited by pain. Pt does not require further acute PT services at this time. Please re-consult if new needs arise.   Pt requesting w/c for long distance use & PT agrees with this recommendation; pt would likely benefit from 20x18 w/c.       PT Assessment Patient does not need any further PT services  Assistance Needed at Discharge  PRN    Equipment Recommendations Wheelchair cushion (measurements PT);Wheelchair (measurements PT) 20x18 w/c  Recommendations for Other Services       Precautions/Restrictions Precautions Precautions: Fall Restrictions Weight Bearing Restrictions: No        Mobility  Bed Mobility       General bed mobility comments: not tested, pt received & left sitting in recliner  Transfers Overall transfer level: Modified independent Equipment used: Rolling walker (2 wheels) Transfers: Sit to/from Stand Sit to Stand:  Modified independent (Device/Increase time)           General transfer comment: STS from recliner    Ambulation/Gait Ambulation/Gait assistance: Modified independent (Device/Increase time) Gait Distance (Feet): 30 Feet Assistive device: Rolling walker (2 wheels) Gait Pattern/deviations: Decreased stride length Gait Speed: Below normal    Home Activity Instructions    Stairs            Modified Rankin (Stroke Patients Only)        Balance Overall balance assessment: History of Falls, Needs assistance   Sitting balance-Leahy Scale: Good     Standing balance support: Bilateral upper extremity supported, During functional activity, Reliant on assistive device for balance Standing balance-Leahy Scale: Fair            Pertinent Vitals/Pain   Pain Assessment Pain Assessment: 0-10 Pain Score: 10-Worst pain ever Pain Location: BLE feet Pain Descriptors / Indicators: Pins and needles Pain Intervention(s): Limited activity within patient's tolerance     Home Living Family/patient expects to be discharged to:: Private residence Living Arrangements:  (sister & nephew) Available Help at Discharge: Family Home Environment: Stairs to enter  [employer] of Steps: 3 without rails, holds to other objects or sister for support Home Equipment: Rollator (4 wheels);BSC/3in1;Shower seat        Prior Function   Comments: Pt reports she does not require assistance for bathing nor dressing, is ambulatory with rollator without assistance, only requires assistance to negotiate stairs into home. Does note 2-3 falls over the past 6 months 2/2 uneven floor/threshold to apartment.  UE/LE Assessment   UE ROM/Strength/Tone/Coordination: WFL    LE ROM/Strength/Tone/Coordination: WFL (hx R transmet, uses post op shoes on BLE, pt with erythema to distal RLE)      Communication   Communication Communication: No apparent difficulties     Cognition Overall Cognitive  Status: Appears within functional limits for tasks assessed/performed       General Comments General comments (skin integrity, edema, etc.): Pt with BLE post op shoes donned during session    Exercises     Assessment/Plan    PT Problem List         PT Visit Diagnosis      No Skilled PT Patient is modified independent with all activity/mobility   Co-evaluation                AMPAC 6 Clicks Help needed turning from your back to your side while in a flat bed without using bedrails?: None Help needed moving from lying on your back to sitting on the side of a flat bed without using bedrails?: None Help needed moving to and from a bed to a chair (including a wheelchair)?: None Help needed standing up from a chair using your arms (e.g., wheelchair or bedside chair)?: None Help needed to walk in hospital room?: None Help needed climbing 3-5 steps with a railing? : A Little 6 Click Score: 23      End of Session   Activity Tolerance: Patient limited by pain Patient left: in chair;with call bell/phone within reach         Time: 1331-1343 PT Time Calculation (min) (ACUTE ONLY): 12 min  Charges:   PT Evaluation $PT Eval Low Complexity: 1 Low      Aleda Grana, PT, DPT 07/31/23, 1:56 PM   Sandi Mariscal  07/31/2023, 1:54 PM

## 2023-07-31 NOTE — Plan of Care (Signed)
Problem: Health Behavior/Discharge Planning: Goal: Ability to manage health-related needs will improve Outcome: Progressing Pt admitted into the hospital for hyperglycemia/ DKA r/t urosepsis.  Upon arrival to the ED pt's CBG was 430.  Per MD's orders her BSBG is checked q4 hours.  She has insulin coverage both sliding scale and scheduled per MD's orders.          Problem: Clinical Measurements: Goal: Will remain free from infection Outcome: Progressing S/Sx of infection monitored and assessed q-shift.  Pt has remained afebrile thus far.  She was admitted into the hospital for hyperglycemia/ DKA r/t urosepsis.  Upon arrival to the ED pt's CBG was 430.  Per MD's orders her BSBG is checked q4 hours.  She has insulin coverage both sliding scale and scheduled per MD's orders.  Pt is also on IV abx per MD's orders.     Problem: Clinical Measurements: Goal: Respiratory complications will improve Outcome: Progressing Respiratory status monitored and assessed q-shift.  Pt is on room air with PO2 at 98% and respiration rate of 18 breaths per minute.  Pt has not endorsed c/o SOB or DOE.      Problem: Activity: Goal: Risk for activity intolerance will decrease Outcome: Progressing Pt is dependent of all her ADLs.  She is x1 assist OOB with RN staff  to ambulate in her room or to the chair.     Problem: Nutrition: Goal: Adequate nutrition will be maintained Outcome: Progressing Pt is on a heart healthy/ carb modified diet per MD's orders.  She has been able to tolerate his diet w/o s/sx of n/v or abdominal pain/ distention.    Problem: Coping: Goal: Level of anxiety will decrease Outcome: Progressing Pt has endorsed c/o anxiety thus far.  Offered support and made presence known.         Problem: Clinical Measurements: Goal: Cardiovascular complication will be avoided Outcome: Progressing Pt was admitted into the hospital for hyperglycemia/ DKA r/t urosepsis.  Upon arrival to the ED pt's CBG was  430.  Per MD's orders her BSBG is checked q4 hours.  She has insulin coverage both sliding scale and scheduled per MD's orders.  Pt is also on IV abx per MD's orders.      Problem: Elimination: Goal: Will not experience complications related to bowel motility Outcome: Progressing Pt is incontinent of both bowel and bladder.  She has not endorsed c/o constipation, dysuria or abdominal pain/ distention.  Pt is checked q2 hours for bowel incontinence.  Perineal care given promptly after each episode.  Pt has a female external catheter (purewick) to capture her urine.  Management of female external catheter (purewick) performed per [facility] policy, procedure and guidelines.          Problem: Pain Managment: Goal: General experience of comfort will improve Outcome: Progressing Pt has endorsed c/o 5-8/10 RLE pain describing it as a constant sharp, throbbing, ache.  Reiterated pain scale so she could adequately rate her pain. Pt stated her pain goal this admission would be 0/10. Discussed nonpharmacological methods to help reduce s/sx of pain. Interventions given per pt's request and MD's orders.    Problem: Safety: Goal: Ability to remain free from injury will improve Outcome: Progressing Pt has remained free from falls thus far.  Instructed pt to utilize RN call light for assistance.  Hourly rounds performed.  Bed alarm implemented to keep pt safe from falls.  Settings activated to third most sensitive mode.  Bed locked, in lowest position, with two upper side  rails engaged.  Belongings and call light within reach.  Pt was observed OOB ambulating in his room with a steady gait.  Nonskid sock implemented.   Problem: Skin Integrity: Goal: Risk for impaired skin integrity will decrease Outcome: Progressing Skin integrity monitored and assessed q-shift.  Instructed pt to turn q2 hours to prevent further skin impairment.  Tubes and drains assessed for device related pressure sores.  She is incontinent of both  bowel and bladder.    Pt is checked q2 hours for bowel incontinence.  Perineal care given promptly after each episode.  Pt has a female external catheter (purewick) to capture her urine.  Management of female external catheter (purewick) performed per [facility] policy, procedure and guidelines.  Dressing changes performed per MD's orders.

## 2023-08-01 DIAGNOSIS — A419 Sepsis, unspecified organism: Secondary | ICD-10-CM | POA: Diagnosis not present

## 2023-08-01 DIAGNOSIS — R6520 Severe sepsis without septic shock: Secondary | ICD-10-CM | POA: Diagnosis not present

## 2023-08-01 LAB — BASIC METABOLIC PANEL
Anion gap: 13 (ref 5–15)
BUN: 50 mg/dL — ABNORMAL HIGH (ref 6–20)
CO2: 18 mmol/L — ABNORMAL LOW (ref 22–32)
Calcium: 8.1 mg/dL — ABNORMAL LOW (ref 8.9–10.3)
Chloride: 104 mmol/L (ref 98–111)
Creatinine, Ser: 1.73 mg/dL — ABNORMAL HIGH (ref 0.44–1.00)
GFR, Estimated: 33 mL/min — ABNORMAL LOW (ref >60)
Glucose, Bld: 321 mg/dL — ABNORMAL HIGH (ref 70–99)
Potassium: 3.8 mmol/L (ref 3.5–5.1)
Sodium: 135 mmol/L (ref 135–145)

## 2023-08-01 LAB — APTT
aPTT: 50 s — ABNORMAL HIGH (ref 24–36)
aPTT: 33 s (ref 24–36)

## 2023-08-01 LAB — CULTURE, BLOOD (ROUTINE X 2)
Culture: NO GROWTH
Culture: NO GROWTH

## 2023-08-01 LAB — CBC
HCT: 26.3 % — ABNORMAL LOW (ref 36.0–46.0)
Hemoglobin: 8.1 g/dL — ABNORMAL LOW (ref 12.0–15.0)
MCH: 29 pg (ref 26.0–34.0)
MCHC: 30.8 g/dL (ref 30.0–36.0)
MCV: 94.3 fL (ref 80.0–100.0)
Platelets: 245 10*3/uL (ref 150–400)
RBC: 2.79 MIL/uL — ABNORMAL LOW (ref 3.87–5.11)
RDW: 14.6 % (ref 11.5–15.5)
WBC: 11.6 10*3/uL — ABNORMAL HIGH (ref 4.0–10.5)
nRBC: 0 % (ref 0.0–0.2)

## 2023-08-01 LAB — HEPARIN LEVEL (UNFRACTIONATED)
Heparin Unfractionated: 0.52 [IU]/mL (ref 0.30–0.70)
Heparin Unfractionated: 0.18 [IU]/mL — ABNORMAL LOW (ref 0.30–0.70)

## 2023-08-01 LAB — GLUCOSE, CAPILLARY
Glucose-Capillary: 113 mg/dL — ABNORMAL HIGH (ref 70–99)
Glucose-Capillary: 119 mg/dL — ABNORMAL HIGH (ref 70–99)
Glucose-Capillary: 140 mg/dL — ABNORMAL HIGH (ref 70–99)
Glucose-Capillary: 161 mg/dL — ABNORMAL HIGH (ref 70–99)
Glucose-Capillary: 197 mg/dL — ABNORMAL HIGH (ref 70–99)
Glucose-Capillary: 382 mg/dL — ABNORMAL HIGH (ref 70–99)

## 2023-08-01 LAB — PROCALCITONIN: Procalcitonin: 3.78 ng/mL

## 2023-08-01 MED ORDER — INSULIN ASPART 100 UNIT/ML IJ SOLN
0.0000 [IU] | Freq: Three times a day (TID) | INTRAMUSCULAR | Status: DC
  Administered 2023-08-02: 4 [IU] via SUBCUTANEOUS
  Administered 2023-08-02: 15 [IU] via SUBCUTANEOUS
  Administered 2023-08-02 – 2023-08-03 (×2): 3 [IU] via SUBCUTANEOUS
  Administered 2023-08-03: 7 [IU] via SUBCUTANEOUS
  Administered 2023-08-04: 3 [IU] via SUBCUTANEOUS
  Administered 2023-08-04 (×2): 11 [IU] via SUBCUTANEOUS
  Administered 2023-08-05: 15 [IU] via SUBCUTANEOUS
  Administered 2023-08-05: 7 [IU] via SUBCUTANEOUS

## 2023-08-01 MED ORDER — INSULIN ASPART 100 UNIT/ML IJ SOLN: 0.0000 [IU] | Freq: Every day | INTRAMUSCULAR | Status: DC

## 2023-08-01 MED ORDER — ZOLPIDEM TARTRATE 5 MG PO TABS
5.0000 mg | ORAL_TABLET | Freq: Every evening | ORAL | Status: DC | PRN
  Administered 2023-08-01 – 2023-08-04 (×4): 5 mg via ORAL
  Filled 2023-08-01 (×4): qty 1

## 2023-08-01 MED ORDER — HEPARIN (PORCINE) 25000 UT/250ML-% IV SOLN
2350.0000 [IU]/h | INTRAVENOUS | Status: DC
  Administered 2023-08-01: 2300 [IU]/h via INTRAVENOUS
  Filled 2023-08-01 (×2): qty 250

## 2023-08-01 MED ORDER — INSULIN GLARGINE-YFGN 100 UNIT/ML ~~LOC~~ SOLN
38.0000 [IU] | Freq: Every day | SUBCUTANEOUS | Status: DC
  Administered 2023-08-02 – 2023-08-05 (×4): 38 [IU] via SUBCUTANEOUS
  Filled 2023-08-01 (×4): qty 0.38

## 2023-08-01 MED ORDER — TORSEMIDE 20 MG PO TABS
50.0000 mg | ORAL_TABLET | Freq: Two times a day (BID) | ORAL | Status: DC
  Administered 2023-08-01 – 2023-08-05 (×8): 50 mg via ORAL
  Filled 2023-08-01 (×8): qty 3

## 2023-08-01 NOTE — Inpatient Diabetes Management (Addendum)
Inpatient Diabetes Program Recommendations  AACE/ADA: New Consensus Statement on Inpatient Glycemic Control (2015)  Target Ranges:  Prepandial:   less than 140 mg/dL      Peak postprandial:   less than 180 mg/dL (1-2 hours)      Critically ill patients:  140 - 180 mg/dL   Lab Results  Component Value Date   GLUCAP 382 (H) 08/01/2023   HGBA1C 11.1 (H) 07/27/2023    Latest Reference Range & Units 07/31/23 07:52 07/31/23 11:39 07/31/23 15:52 07/31/23 20:33 08/01/23 00:38 08/01/23 04:29 08/01/23 08:18  Glucose-Capillary 70 - 99 mg/dL 621 (H) Total Novolog 12 units 219 (H)Novolog 12 units 173 (H) Novolog 9 units 171 (H) Novolog 4 units 161 (H) Novolog 4 units 197 (H) Novolog 4 units 382 (H) Novolog 25 units  (H): Data is abnormally high  Diabetes history: DM2 Outpatient Diabetes medications: Lantus 38 units daily, Humalog 10 units tid, Jardiance 10 mg daily Current orders for Inpatient glycemic control: Semglee 35 units daily, Novolog 5 units tid meal coverage, Novolog 0-20 units q 4 hrs.  Inpatient Diabetes Program Recommendations:   Since patient is now eating, please consider: -Change Novolog correction to tid, 0-5 units hs -Increase Semglee to 38 units (home basal dose)  Thank you, Darel Hong E. Jarmel Linhardt, RN, MSN, CDE  Diabetes Coordinator Inpatient Glycemic Control Team Team Pager [phone] (8am-5pm) 08/01/2023 11:20 AM

## 2023-08-01 NOTE — Progress Notes (Signed)
ANTICOAGULATION CONSULT NOTE    Pharmacy Consult for Eliquis ->Heparin Indication:  PAF (holding Eliquis for  angiogram  Allergies  Allergen Reactions   Gabapentin Nausea And Vomiting and Other (See Comments)    POSSIBLE SHAKING   Lyrica [Pregabalin] Other (See Comments)    Shaking     Patient Measurements: Height: 5\' 4"  (162.6 cm) Weight: 103.6 kg (228 lb 6.3 oz) IBW/kg (Calculated) : 54.7 Heparin Dosing Weight: 88 kg  Vital Signs: Temp: 99.1 F (37.3 C) (09/17 0816) Temp Source: Oral (09/17 0816) BP: 144/67 (09/17 0816) Pulse Rate: 64 (09/17 0816)  Labs: Recent Labs    07/30/23 0643 07/30/23 1638 07/31/23 0717 07/31/23 0935 07/31/23 1413 07/31/23 2110 08/01/23 0820  HGB 8.0*  --  8.5*  --   --   --  8.1*  HCT 26.0*  --  28.3*  --   --   --  26.3*  PLT 199  --  210  --   --   --  245  APTT 57*   < > >200*   < > 59* 71* 50*  HEPARINUNFRC >1.10*  --  >1.10*  --   --   --  0.52  CREATININE 1.42*  --  1.46*  --   --   --  1.73*   < > = values in this interval not displayed.    Estimated Creatinine Clearance: 40.6 mL/min (A) (by C-G formula based on SCr of 1.73 mg/dL (H)).   Medical History: Past Medical History:  Diagnosis Date   Alcohol abuse    Alcoholic cirrhosis ([name])    Anemia    Anxiety    Arthritis    "knees" (11/26/2018)   B12 deficiency    CAD (coronary artery disease)    a. 11/10/2014 Cath: LM nl, LAD min irregs, D1 30 ost, D2 50d, LCX 85m, OM1 80 p/m (1.5 mm vessel), OM2 28m, RCA nondom 9m-->med rx.. Demand ischemia in the setting of rapid a-fib.   Cardiomyopathy ([name])    Carotid artery disease ([name])    a. 01/2015 Carotid Angio: RICA 100, LICA 95p; b. 02/2015 s/p L CEA; c. 05/2019 Carotid U/S: RICA 100. RECA >50. LICA 1-39%.   Cellulitis    lower extremities   CHF (congestive heart failure) ([name])    Chronic combined systolic and diastolic CHF (congestive heart failure) ([name])    a. 10/2014 Echo: EF 40-45%; b. 10/2018 Echo: EF 45-50%, gr2 DD; c.  11/2019 Echo: EF 50%, mild LVH, gr2 DD (restrictive), antlat HK, Nl RV fxn. Mild BAE. RVSP 59.45mmHg.   CKD (chronic kidney disease), stage III ([name])    Cocaine abuse ([name])    COPD (chronic obstructive pulmonary disease) ([name])    Critical lower limb ischemia ([name])    Depression    Diabetes mellitus without complication ([name])    Diabetic peripheral neuropathy ([name])    DVT (deep venous thrombosis) ([name])    Dyspnea    Elevated troponin    a. Chronic elevation.   GERD (gastroesophageal reflux disease)    Hyperlipemia    Hypertension    Hypokalemia    Hypomagnesemia    Hypothyroidism    Marijuana abuse    Narcotic abuse ([name])    Noncompliance    NSVT (nonsustained ventricular tachycardia) ([name])    Obesity    PAF (paroxysmal atrial fibrillation) ([name])    Paroxysmal atrial tachycardia    Peripheral arterial disease ([name])    a. 01/2015 Angio/PTA: RSFA 99 (atherectomy/pta) - 1 vessel runoff via  diff dzs peroneal; b. 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   Pneumonia    "once or twice" (11/26/2018)   Poorly controlled type 2 diabetes mellitus ([name])    Renal disorder    Renal insufficiency    a. 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   Sleep apnea    "couldn't handle wearing the mask" (11/26/2018)   Symptomatic bradycardia    a. Avoid AV blocking agent per EP. Prev req temp wire in 2017.   Tobacco abuse     Medications:  Scheduled:   arformoterol  15 mcg Nebulization BID   budesonide (PULMICORT) nebulizer solution  0.5 mg Nebulization BID   buPROPion ER  100 mg Oral Daily   Chlorhexidine Gluconate Cloth  6 each Topical Daily   insulin aspart  0-20 Units Subcutaneous Q4H   insulin aspart  5 Units Subcutaneous TID WC   insulin glargine-yfgn  35 Units Subcutaneous Daily   levothyroxine  50 mcg Oral QAC breakfast   metoprolol tartrate  12.5 mg Oral BID   revefenacin  175 mcg Nebulization Daily   rosuvastatin  40 mg Oral Daily   torsemide  50 mg Oral Daily   Infusions:    heparin 2,150 Units/hr (08/01/23 0930)   piperacillin-tazobactam (ZOSYN)  IV 3.375 g (08/01/23 0550)    Assessment: 62 y.o. F presents with. On Eliquis pta for afib and was continued in hospital. Last dose 9/13 2130. To transition to heparin for angiogram early next week. Venous duplex of RLE negative for DVT. Eliquis will be affecting heparin levels so will utilize aPTT for monitoring until levels correlate.  9/17AM: aPTT 50 (subtherapeutic) on heparin 2150 u/hr despite rate increase. No bleeding issues, CBC stable.  Patient's HL did return at 0.52 (therapeutic), not yet correlating so will continue to titrate using aPTT levels. RN noted that heparin infusion was paused due to lost access, estimated it was off for an hour. With levels being close to therapeutic, even after losing heparin access, will continue at previous therapeutic rate and reassess 6hr after time of Heparin restart.   Goal of Therapy:  Heparin level 0.3-0.7 units/ml aPTT 66-102 seconds Monitor platelets by anticoagulation protocol: Yes   Plan:  Continue heparin infusion to 2,150 units/hr Will f/u 6hr aPTT and HL   Daily aPTT, heparin level and CBC  Gwynn Burly, PharmD PGY-1 Pharmacy Resident

## 2023-08-01 NOTE — Progress Notes (Signed)
ANTICOAGULATION CONSULT NOTE    Pharmacy Consult for Eliquis ->Heparin Indication:  PAF (holding Eliquis for  angiogram  Allergies  Allergen Reactions   Gabapentin Nausea And Vomiting and Other (See Comments)    POSSIBLE SHAKING   Lyrica [Pregabalin] Other (See Comments)    Shaking     Patient Measurements: Height: 5\' 4"  (162.6 cm) Weight: 103.6 kg (228 lb 6.3 oz) IBW/kg (Calculated) : 54.7 Heparin Dosing Weight: 88 kg  Vital Signs: Temp: 99.1 F (37.3 C) (09/17 0816) Temp Source: Oral (09/17 0816) BP: 144/67 (09/17 0816) Pulse Rate: 64 (09/17 0816)  Labs: Recent Labs    07/30/23 0643 07/30/23 1638 07/31/23 0717 07/31/23 0935 07/31/23 2110 08/01/23 0820 08/01/23 1538  HGB 8.0*  --  8.5*  --   --  8.1*  --   HCT 26.0*  --  28.3*  --   --  26.3*  --   PLT 199  --  210  --   --  245  --   APTT 57*   < > >200*   < > 71* 50* 33  HEPARINUNFRC >1.10*  --  >1.10*  --   --  0.52 0.18*  CREATININE 1.42*  --  1.46*  --   --  1.73*  --    < > = values in this interval not displayed.    Estimated Creatinine Clearance: 40.6 mL/min (A) (by C-G formula based on SCr of 1.73 mg/dL (H)).   Medical History: Past Medical History:  Diagnosis Date   Alcohol abuse    Alcoholic cirrhosis ([name])    Anemia    Anxiety    Arthritis    "knees" (11/26/2018)   B12 deficiency    CAD (coronary artery disease)    a. 11/10/2014 Cath: LM nl, LAD min irregs, D1 30 ost, D2 50d, LCX 52m, OM1 80 p/m (1.5 mm vessel), OM2 24m, RCA nondom 22m-->med rx.. Demand ischemia in the setting of rapid a-fib.   Cardiomyopathy ([name])    Carotid artery disease ([name])    a. 01/2015 Carotid Angio: RICA 100, LICA 95p; b. 02/2015 s/p L CEA; c. 05/2019 Carotid U/S: RICA 100. RECA >50. LICA 1-39%.   Cellulitis    lower extremities   CHF (congestive heart failure) ([name])    Chronic combined systolic and diastolic CHF (congestive heart failure) ([name])    a. 10/2014 Echo: EF 40-45%; b. 10/2018 Echo: EF 45-50%, gr2 DD; c.  11/2019 Echo: EF 50%, mild LVH, gr2 DD (restrictive), antlat HK, Nl RV fxn. Mild BAE. RVSP 59.48mmHg.   CKD (chronic kidney disease), stage III ([name])    Cocaine abuse ([name])    COPD (chronic obstructive pulmonary disease) ([name])    Critical lower limb ischemia ([name])    Depression    Diabetes mellitus without complication ([name])    Diabetic peripheral neuropathy ([name])    DVT (deep venous thrombosis) ([name])    Dyspnea    Elevated troponin    a. Chronic elevation.   GERD (gastroesophageal reflux disease)    Hyperlipemia    Hypertension    Hypokalemia    Hypomagnesemia    Hypothyroidism    Marijuana abuse    Narcotic abuse ([name])    Noncompliance    NSVT (nonsustained ventricular tachycardia) ([name])    Obesity    PAF (paroxysmal atrial fibrillation) ([name])    Paroxysmal atrial tachycardia    Peripheral arterial disease ([name])    a. 01/2015 Angio/PTA: RSFA 99 (atherectomy/pta) - 1 vessel runoff via diff dzs  peroneal; b. 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   Pneumonia    "once or twice" (11/26/2018)   Poorly controlled type 2 diabetes mellitus ([name])    Renal disorder    Renal insufficiency    a. 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   Sleep apnea    "couldn't handle wearing the mask" (11/26/2018)   Symptomatic bradycardia    a. Avoid AV blocking agent per EP. Prev req temp wire in 2017.   Tobacco abuse     Medications:  Scheduled:   arformoterol  15 mcg Nebulization BID   budesonide (PULMICORT) nebulizer solution  0.5 mg Nebulization BID   buPROPion ER  100 mg Oral Daily   Chlorhexidine Gluconate Cloth  6 each Topical Daily   insulin aspart  0-20 Units Subcutaneous TID WC   insulin aspart  0-5 Units Subcutaneous QHS   insulin aspart  5 Units Subcutaneous TID WC   [START ON 08/02/2023] insulin glargine-yfgn  38 Units Subcutaneous Daily   levothyroxine  50 mcg Oral QAC breakfast   metoprolol tartrate  12.5 mg Oral BID   revefenacin  175 mcg Nebulization Daily   rosuvastatin   40 mg Oral Daily   torsemide  50 mg Oral BID   Infusions:   heparin 2,150 Units/hr (08/01/23 0930)   piperacillin-tazobactam (ZOSYN)  IV 3.375 g (08/01/23 1316)    Assessment: 62 y.o. F presents with. On Eliquis pta for afib and was continued in hospital. Last dose 9/13 2130. To transition to heparin for angiogram early next week. Venous duplex of RLE negative for DVT. Eliquis will be affecting heparin levels so will utilize aPTT for monitoring until levels correlate.  9/17AM: aPTT 50 (subtherapeutic) on heparin 2150 u/hr despite rate increase. No bleeding issues, CBC stable.  Patient's HL did return at 0.52 (therapeutic), not yet correlating so will continue to titrate using aPTT levels. RN noted that heparin infusion was paused due to lost access, estimated it was off for an hour. With levels being close to therapeutic, even after losing heparin access, will continue at previous therapeutic rate and reassess 6hr after time of Heparin restart.   9/17: aPTT decreased 33 and HL decreased 0.18 - per discussion with RN, heparin has been infusing continuously at 2150 units/hr. Level was drawn from same arm that heparin is infusing due to patient being a very hard stick, but drip was paused ~10 minutes prior to lab draw.  Goal of Therapy:  Heparin level 0.3-0.7 units/ml aPTT 66-102 seconds Monitor platelets by anticoagulation protocol: Yes   Plan:  Increase heparin infusion to 2300 units/hr Daily aPTT, heparin level and CBC  Loralee Pacas, PharmD, BCPS 08/01/2023 4:58 PM  Please check AMION for all [facility] Pharm[facility] phone numbers After 10:00 PM, call Main Pharmacy [phone]

## 2023-08-01 NOTE — Progress Notes (Signed)
PROGRESS NOTE    [name]  ZDG:[id number] DOB: 03-16-1961 DOA: 07/27/2023 PCP: [name], MD    Brief Narrative:   [name] is a 62 y.o. female with past medical history significant for combined chronic systolic and diastolic congestive heart failure, HTN, PAD s/p right TMA, CAD, type 2 diabetes mellitus, COPD, CKD stage IIIb, gout, right foot ulcer, carotid stenosis s/p left CEA, paroxysmal atrial fibrillation on Eliquis, tobacco use disorder who presented to [facility] ED on 9/12 via EMS from home with complaints of confusion, weakness, lethargy, decreased urine output, and high blood sugar.  On arrival to the ED, patient was noted to be short of breath, hypoxic and placed on BiPAP.  Temperature 99.0 F, HR 87, RR 28, BP 115/60, SpO2 100% on BiPAP with FiO2 40%.  VBG with pH 3.372, pCO2 27.9, PaO2 70.  Sodium 133, potassium 5.0, chloride 102, CO2 17, glucose 340, BUN 82, creatinine 2.68.  AST 39, ALT 30, total bilirubin 0.4.  Magnesium 2.3.  WBC 24.1, hemoglobin 10.2, platelet count 271.  Urinalysis with moderate leukocytes, positive nitrite, many bacteria, 21-50 WBCs.  TSH 1.038.  Chest x-ray with no active cardiopulmonary disease process, hypoinflation of lungs.  Blood cultures x 2 obtained.  Patient was started on empiric antibiotics and continued on BiPAP.  Given her concern for decompensation, patient was admitted to the pulmonary critical care service for concerns of severe sepsis secondary to urinary tract infection, acute metabolic encephalopathy and respiratory failure requiring BiPAP.  Significant Hospital events: 9/12: Admit to ICU, IV fluids, IV antibiotics, BiPAP 9/13: Titrated off of BiPAP, remains on IV antibiotics, WBC count improving 9/14: Transferred to hospitalist service, diet advanced, holding Eliquis, starting heparin drip with plan for vascular surgery intervention this week  Assessment & Plan:   Acute metabolic encephalopathy, POA Patient presenting to the ED  with confusion and found to be altered and hypoxic.  Initially placed on BiPAP and urinalysis consistent with urinary tract infection; started on empiric antibiotics.  BiPAP was slowly weaned off.  Mental status appears now to be back at her baseline. -- Continue treatment as below  Severe sepsis, POA Urinary tract infection Patient presented to ED confused, decreased urine output.  WBC count 24.8.  Urinalysis with moderate leukocytes, positive nitrite, many bacteria, 21-50 WBCs.  TSH within normal limits chest x-ray with no active cardiopulmonary disease process.  MRSA PCR negative.  Unfortunately urine culture not sent on admission.  Review of EMR notable for previous E. coli, Klebsiella, Staphylococcus, Enterococcus UTI. -- WBC 24.1>19.0>15.6>10.3>11.6>11.6 -- Procalcitonin 24.17>21.13>12.50>6.47>1.78 -- Blood cultures x 2: No growth x 5 days -- Continue Zosyn 3.375 g IV every 8 hours, plan 5-day course -- CBC/procalcitonin daily  COPD exacerbation Resenting with respiratory distress, hypoxia.  Initially requiring BiPAP which is now titrated off. -- Brovana neb twice daily -- Pulmicort neb twice daily -- Yupelri neb daily  Peripheral vascular disease w/ Hx left common femoral to posterior tibial artery bypass PAD s/p right TMA Right foot ulcer Right lower extremity venous duplex ultrasound negative for DVT. -- Vascular surgery following, appreciate assistance -- Holding Eliquis, on heparin drip; pharmacy consulted for dosing/monitoring -- Crestor 40 mg p.o. daily -- Angiogram pending this week; timing per vascular surgery  Chronic combined systolic/diastolic congestive heart failure, compensated Essential hypertension TTE 03/02/2023 with LVEF 50-55%, LV with no regional wall motion abnormalities, indeterminate LV diastolic parameters, trivial MR, no aortic stenosis, IVC normal in size.  Follow-up with cardiology outpatient, advanced heart failure team  Dr. Shirlee Latch.  Home regimen includes  losartan 50 mg p.o. daily, spironolactone 12.5 mg p.o. daily, torsemide 100 mg p.o. twice daily. -- Resume torsemide at reduced dose 50 mg p.o. BID -- Continue to hold home losartan and spironolactone for now -- Fluid restrict 1500 mL/day -- Strict I's and O's and daily weights -- Follow BMP daily  Type 2 diabetes mellitus with hyperglycemia Hemoglobin A1c 11.1, poorly controlled.  Home regimen includes Lantus 38 units subcutaneously daily, Humalog 10 units 3 times daily AC, Jardiance. -- Semglee 38u [place] daily -- Novolog 5u TIDAC (if eating >50% of meals; hold if NPO) -- SSI for coverage -- CBGs qAC/HS  Hypothyroidism TSH 1.038, within normal limits. -- Levothyroxine 50 mcg p.o. daily  Paroxysmal atrial fibrillation on anticoagulation Not on any rate controlling medications at baseline. -- Started on metoprolol tartrate 12.5 mg p.o. twice daily -- Eliquis on hold as above, starting heparin drip  Acute renal failure on CKD stage IIIb Baseline creatinine 1.6-1.7. -- Cr 2.68>2.08>1.51>1.39>1.42>1.46>1.73 -- BMP daily  Anemia of chronic medical/renal disease Hemoglobin 8.1, stable. -- CBC daily  Tobacco use disorder Counseled on need for complete cessation/abstinence  Obesity: Body mass index is 39.2 kg/m.  Complicates all facets of care   DVT prophylaxis: Heparin drip    Code Status: Full Code Family Communication: No family present at bedside this morning  Disposition Plan:  Level of care: Med-Surg Status is: Inpatient Remains inpatient appropriate because: IV antibiotics, heparin drip, pending vascular surgery intervention with angiogram this week    Consultants:  PCCM Vascular surgery  Procedures:  Right lower extremity venous duplex ultrasound  Antimicrobials:  Zosyn 9/12>> Vancomycin 9/12 - 9/12 Cefepime 9/12 - 9/12   Subjective: Patient seen examined bedside, resting calmly.  Sitting in bedside chair with legs dangling down.  Complaining of pain and  swelling to legs.  Will increase torsemide to 50 mg twice daily today with improvement of her blood pressure.  Continue to await timing of angiogram per vascular surgery.  Otherwise no other specific complaints or concerns at this time.  Awaiting vascular surgery to determine timing of arteriogram.   Denies headache, no current dizziness, no chest pain, no palpitations, no shortness of breath, no abdominal pain, no fever/chills/night sweats, no nausea cefonicid diarrhea, no focal weakness, fatigue, no paresthesias.  No acute events overnight per nursing staff.  Objective: Vitals:   07/31/23 2021 08/01/23 0424 08/01/23 0816 08/01/23 0858  BP: (!) 147/58 (!) 130/59 (!) 144/67   Pulse: 65 (!) 58 64   Resp: 17 17    Temp: 99.4 F (37.4 C) 98.1 F (36.7 C) 99.1 F (37.3 C)   TempSrc: Oral Oral Oral   SpO2: 100% 99% 100% 100%  Weight:      Height:        Intake/Output Summary (Last 24 hours) at 08/01/2023 1220 Last data filed at 07/31/2023 1655 Gross per 24 hour  Intake 542.1 ml  Output --  Net 542.1 ml   Filed Weights   07/29/23 0500 07/30/23 0500 07/31/23 0500  Weight: 101.3 kg 101.3 kg 103.6 kg    Examination:  Physical Exam: GEN: NAD, alert and oriented x 3, chronically ill appearance, appears older than stated age, obese HEENT: NCAT, PERRL, EOMI, sclera clear, MMM PULM: Breath sounds diminished bilateral bases, no wheezes/crackles, normal respiratory effort without accessory muscle use, on room air CV: RRR w/o M/G/R GI: abd soft, NTND, NABS, no R/G/M MSK: 2-3+ pitting edema bilateral lower extremities to knee, noted right  TMA with ulcer, moves all extremities independently  NEURO: No focal neurological deficit PSYCH: normal mood/affect Integumentary: Right foot plantar ulcer as above with chronic venous changes anterior aspect bilateral lower extremities, otherwise no other concerning rashes/lesions/wounds noted on exposed skin surfaces     Data Reviewed: I have personally  reviewed following labs and imaging studies  CBC: Recent Labs  Lab 07/28/23 1408 07/29/23 0723 07/30/23 0643 07/31/23 0717 08/01/23 0820  WBC 19.0* 15.6* 10.3 11.8* 11.6*  HGB 8.3* 8.6* 8.0* 8.5* 8.1*  HCT 27.6* 28.6* 26.0* 28.3* 26.3*  MCV 92.3 95.7 92.5 91.3 94.3  PLT 228 209 199 210 245   Basic Metabolic Panel: Recent Labs  Lab 07/27/23 1039 07/27/23 1059 07/28/23 1408 07/29/23 0723 07/30/23 0643 07/31/23 0717 08/01/23 0820  NA 133*   < > 139 139 134* 137 135  K 5.0   < > 4.0 4.2 3.6 3.7 3.8  CL 102   < > 110 111 108 107 104  CO2 17*   < > 21* 20* 19* 20* 18*  GLUCOSE 340*   < > 208* 127* 171* 141* 321*  BUN 82*   < > 55* 47* 45* 43* 50*  CREATININE 2.68*   < > 1.51* 1.39* 1.42* 1.46* 1.73*  CALCIUM 8.5*   < > 8.4* 8.0* 7.6* 8.0* 8.1*  MG 2.3  --   --   --  2.0  --   --    < > = values in this interval not displayed.   GFR: Estimated Creatinine Clearance: 40.6 mL/min (A) (by C-G formula based on SCr of 1.73 mg/dL (H)). Liver Function Tests: Recent Labs  Lab 07/27/23 1039 07/27/23 1930  AST 39 26  ALT 30 27  ALKPHOS 172* 154*  BILITOT 0.4 0.3  PROT 7.1 7.0  ALBUMIN 3.1* 2.9*   No results for input(s): "LIPASE", "AMYLASE" in the last 168 hours. No results for input(s): "AMMONIA" in the last 168 hours. Coagulation Profile: No results for input(s): "INR", "PROTIME" in the last 168 hours. Cardiac Enzymes: No results for input(s): "CKTOTAL", "CKMB", "CKMBINDEX", "TROPONINI" in the last 168 hours. BNP (last 3 results) No results for input(s): "PROBNP" in the last 8760 hours. HbA1C: No results for input(s): "HGBA1C" in the last 72 hours.  CBG: Recent Labs  Lab 07/31/23 1552 07/31/23 2033 08/01/23 0038 08/01/23 0429 08/01/23 0818  GLUCAP 173* 171* 161* 197* 382*   Lipid Profile: No results for input(s): "CHOL", "HDL", "LDLCALC", "TRIG", "CHOLHDL", "LDLDIRECT" in the last 72 hours. Thyroid Function Tests: No results for input(s): "TSH", "T4TOTAL",  "FREET4", "T3FREE", "THYROIDAB" in the last 72 hours.  Anemia Panel: No results for input(s): "VITAMINB12", "FOLATE", "FERRITIN", "TIBC", "IRON", "RETICCTPCT" in the last 72 hours. Sepsis Labs: Recent Labs  Lab 07/27/23 1418 07/27/23 1710 07/27/23 1930 07/29/23 0723 07/30/23 0643 07/31/23 0717 08/01/23 0820  PROCALCITON  --    < >  --  21.13 12.50 6.47 3.78  LATICACIDVEN 1.4  --  1.5  --   --   --   --    < > = values in this interval not displayed.    Recent Results (from the past 240 hour(s))  Blood culture (routine x 2)     Status: None   Collection Time: 07/27/23 12:43 PM   Specimen: BLOOD  Result Value Ref Range Status   Specimen Description BLOOD SITE NOT SPECIFIED  Final   Special Requests   Final    BOTTLES DRAWN AEROBIC AND ANAEROBIC Blood Culture results may  not be optimal due to an inadequate volume of blood received in culture bottles   Culture   Final    NO GROWTH 5 DAYS Performed at [facility], [address], [place], [place] [address]    Report Status 08/01/2023 FINAL  Final  Blood culture (routine x 2)     Status: None   Collection Time: 07/27/23 12:48 PM   Specimen: BLOOD  Result Value Ref Range Status   Specimen Description BLOOD SITE NOT SPECIFIED  Final   Special Requests   Final    BOTTLES DRAWN AEROBIC AND ANAEROBIC Blood Culture results may not be optimal due to an inadequate volume of blood received in culture bottles   Culture   Final    NO GROWTH 5 DAYS Performed at [facility], [address] [place], [place] [address]    Report Status 08/01/2023 FINAL  Final  MRSA Next Gen by PCR, Nasal     Status: None   Collection Time: 07/27/23  6:05 PM   Specimen: Nasal Mucosa; Nasal Swab  Result Value Ref Range Status   MRSA by PCR Next Gen NOT DETECTED NOT DETECTED Final    Comment: (NOTE) The GeneXpert MRSA Assay (FDA approved for NASAL specimens only), is one component of a comprehensive MRSA colonization surveillance program. It  is not intended to diagnose MRSA infection nor to guide or monitor treatment for MRSA infections. Test performance is not FDA approved in patients less than 81 years old. Performed at [facility], [address] [place], [place] [address]          Radiology Studies: No results found.      Scheduled Meds:  arformoterol  15 mcg Nebulization BID   budesonide (PULMICORT) nebulizer solution  0.5 mg Nebulization BID   buPROPion ER  100 mg Oral Daily   Chlorhexidine Gluconate Cloth  6 each Topical Daily   insulin aspart  0-20 Units Subcutaneous TID WC   insulin aspart  0-5 Units Subcutaneous QHS   insulin aspart  5 Units Subcutaneous TID WC   [START ON 08/02/2023] insulin glargine-yfgn  38 Units Subcutaneous Daily   levothyroxine  50 mcg Oral QAC breakfast   metoprolol tartrate  12.5 mg Oral BID   revefenacin  175 mcg Nebulization Daily   rosuvastatin  40 mg Oral Daily   torsemide  50 mg Oral Daily   Continuous Infusions:  heparin 2,150 Units/hr (08/01/23 0930)   piperacillin-tazobactam (ZOSYN)  IV 3.375 g (08/01/23 0550)     LOS: 5 days    Time spent: 51 minutes spent on chart review, discussion with nursing staff, consultants, updating family and interview/physical exam; more than 50% of that time was spent in counseling and/or coordination of care.    Alvira Philips [place], DO [facility] Available via Epic secure chat 7am-7pm After these hours, please refer to coverage provider listed on [email or web address] 08/01/2023, 12:20 PM

## 2023-08-02 ENCOUNTER — Ambulatory Visit (HOSPITAL_BASED_OUTPATIENT_CLINIC_OR_DEPARTMENT_OTHER): Payer: Medicare HMO | Admitting: Physician Assistant

## 2023-08-02 DIAGNOSIS — N179 Acute kidney failure, unspecified: Secondary | ICD-10-CM

## 2023-08-02 DIAGNOSIS — N390 Urinary tract infection, site not specified: Secondary | ICD-10-CM | POA: Diagnosis not present

## 2023-08-02 DIAGNOSIS — G9340 Encephalopathy, unspecified: Secondary | ICD-10-CM | POA: Diagnosis not present

## 2023-08-02 DIAGNOSIS — A419 Sepsis, unspecified organism: Secondary | ICD-10-CM | POA: Diagnosis not present

## 2023-08-02 DIAGNOSIS — N1832 Chronic kidney disease, stage 3b: Secondary | ICD-10-CM

## 2023-08-02 LAB — HEPARIN LEVEL (UNFRACTIONATED)
Heparin Unfractionated: 0.43 [IU]/mL (ref 0.30–0.70)
Heparin Unfractionated: 0.68 [IU]/mL (ref 0.30–0.70)

## 2023-08-02 LAB — GLUCOSE, CAPILLARY
Glucose-Capillary: 171 mg/dL — ABNORMAL HIGH (ref 70–99)
Glucose-Capillary: 312 mg/dL — ABNORMAL HIGH (ref 70–99)
Glucose-Capillary: 149 mg/dL — ABNORMAL HIGH (ref 70–99)
Glucose-Capillary: 120 mg/dL — ABNORMAL HIGH (ref 70–99)

## 2023-08-02 MED ORDER — FLUCONAZOLE 200 MG PO TABS
200.0000 mg | ORAL_TABLET | Freq: Once | ORAL | Status: AC
  Administered 2023-08-02: 200 mg via ORAL
  Filled 2023-08-02: qty 1

## 2023-08-02 MED ORDER — NYSTATIN 100000 UNIT/GM EX CREA
TOPICAL_CREAM | Freq: Two times a day (BID) | CUTANEOUS | Status: DC
  Filled 2023-08-02: qty 30

## 2023-08-02 MED ORDER — HEPARIN (PORCINE) 25000 UT/250ML-% IV SOLN
2300.0000 [IU]/h | INTRAVENOUS | Status: AC
  Administered 2023-08-02: 2300 [IU]/h via INTRAVENOUS
  Filled 2023-08-02 (×2): qty 250

## 2023-08-02 NOTE — Progress Notes (Signed)
ANTICOAGULATION CONSULT NOTE    Pharmacy Consult for Eliquis ->Heparin Indication:  PAF (holding Eliquis for  angiogram  Allergies  Allergen Reactions   Gabapentin Nausea And Vomiting and Other (See Comments)    POSSIBLE SHAKING   Lyrica [Pregabalin] Other (See Comments)    Shaking     Patient Measurements: Height: 5\' 4"  (162.6 cm) Weight: 103.6 kg (228 lb 6.3 oz) IBW/kg (Calculated) : 54.7 Heparin Dosing Weight: 88 kg  Vital Signs: Temp: 97.6 F (36.4 C) (09/18 0816) Temp Source: Oral (09/18 0816) BP: 151/63 (09/18 0816) Pulse Rate: 59 (09/18 0816)  Labs: Recent Labs    07/31/23 0717 07/31/23 0935 08/01/23 0820 08/01/23 1538 08/02/23 0945  HGB 8.5*  --  8.1*  --  8.2*  HCT 28.3*  --  26.3*  --  26.7*  PLT 210  --  245  --  291  APTT >200*   < > 50* 33 54*  HEPARINUNFRC >1.10*  --  0.52 0.18* 0.43  CREATININE 1.46*  --  1.73*  --   --    < > = values in this interval not displayed.    Estimated Creatinine Clearance: 40.6 mL/min (A) (by C-G formula based on SCr of 1.73 mg/dL (H)).   Medical History: Past Medical History:  Diagnosis Date   Alcohol abuse    Alcoholic cirrhosis ([name])    Anemia    Anxiety    Arthritis    "knees" (11/26/2018)   B12 deficiency    CAD (coronary artery disease)    a. 11/10/2014 Cath: LM nl, LAD min irregs, D1 30 ost, D2 50d, LCX 55m, OM1 80 p/m (1.5 mm vessel), OM2 59m, RCA nondom 36m-->med rx.. Demand ischemia in the setting of rapid a-fib.   Cardiomyopathy ([name])    Carotid artery disease ([name])    a. 01/2015 Carotid Angio: RICA 100, LICA 95p; b. 02/2015 s/p L CEA; c. 05/2019 Carotid U/S: RICA 100. RECA >50. LICA 1-39%.   Cellulitis    lower extremities   CHF (congestive heart failure) ([name])    Chronic combined systolic and diastolic CHF (congestive heart failure) ([name])    a. 10/2014 Echo: EF 40-45%; b. 10/2018 Echo: EF 45-50%, gr2 DD; c. 11/2019 Echo: EF 50%, mild LVH, gr2 DD (restrictive), antlat HK, Nl RV fxn. Mild BAE. RVSP  59.35mmHg.   CKD (chronic kidney disease), stage III ([name])    Cocaine abuse ([name])    COPD (chronic obstructive pulmonary disease) ([name])    Critical lower limb ischemia ([name])    Depression    Diabetes mellitus without complication ([name])    Diabetic peripheral neuropathy ([name])    DVT (deep venous thrombosis) ([name])    Dyspnea    Elevated troponin    a. Chronic elevation.   GERD (gastroesophageal reflux disease)    Hyperlipemia    Hypertension    Hypokalemia    Hypomagnesemia    Hypothyroidism    Marijuana abuse    Narcotic abuse ([name])    Noncompliance    NSVT (nonsustained ventricular tachycardia) ([name])    Obesity    PAF (paroxysmal atrial fibrillation) ([name])    Paroxysmal atrial tachycardia    Peripheral arterial disease ([name])    a. 01/2015 Angio/PTA: RSFA 99 (atherectomy/pta) - 1 vessel runoff via diff dzs peroneal; b. 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   Pneumonia    "once or twice" (11/26/2018)   Poorly controlled type 2 diabetes mellitus ([name])  Renal disorder    Renal insufficiency    a. 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   Sleep apnea    "couldn't handle wearing the mask" (11/26/2018)   Symptomatic bradycardia    a. Avoid AV blocking agent per EP. Prev req temp wire in 2017.   Tobacco abuse     Medications:  Scheduled:   arformoterol  15 mcg Nebulization BID   budesonide (PULMICORT) nebulizer solution  0.5 mg Nebulization BID   buPROPion ER  100 mg Oral Daily   Chlorhexidine Gluconate Cloth  6 each Topical Daily   fluconazole  200 mg Oral Once   insulin aspart  0-20 Units Subcutaneous TID WC   insulin aspart  0-5 Units Subcutaneous QHS   insulin aspart  5 Units Subcutaneous TID WC   insulin glargine-yfgn  38 Units Subcutaneous Daily   levothyroxine  50 mcg Oral QAC breakfast   metoprolol tartrate  12.5 mg Oral BID   nystatin cream   Topical BID   revefenacin  175 mcg Nebulization Daily   rosuvastatin  40 mg Oral Daily   torsemide  50 mg Oral BID    Infusions:   heparin 2,300 Units/hr (08/02/23 0557)    Assessment: 62 y.o. F presents with. On Eliquis pta for afib and was continued in hospital. Last dose 9/13 2130. To transition to heparin for angiogram early next week. Venous duplex of RLE negative for DVT. Eliquis will be affecting heparin levels so will utilize aPTT for monitoring until levels correlate.   9/18: Heparin level was therapeutic this AM at 0.43, and aPTT subtherapeutic at 54s following rate increase to 2,300 units/hour. Previous heparin level and aPTT were correlating, so will begin to titrate heparin off of heparin level   Level was drawn from same arm that heparin is infusing due to patient being a very hard stick, but drip was paused ~10 minutes prior to lab draw.  Goal of Therapy:  Heparin level 0.3-0.7 Monitor platelets by anticoagulation protocol: Yes   Plan:  Slight increase heparin infusion to 2350 units/hr F/U confirmatory heparin level in 6 hours  Daily heparin level and CBC  Jani Gravel, PharmD Clinical Pharmacist  08/02/2023 11:33 AM

## 2023-08-02 NOTE — Progress Notes (Signed)
ANTICOAGULATION CONSULT NOTE    Pharmacy Consult for Eliquis ->Heparin Indication:  PAF (holding Eliquis for  angiogram  Allergies  Allergen Reactions   Gabapentin Nausea And Vomiting and Other (See Comments)    POSSIBLE SHAKING   Lyrica [Pregabalin] Other (See Comments)    Shaking     Patient Measurements: Height: 5\' 4"  (162.6 cm) Weight: 103.6 kg (228 lb 6.3 oz) IBW/kg (Calculated) : 54.7 Heparin Dosing Weight: 88 kg  Vital Signs: Temp: 98.4 F (36.9 C) (09/18 1950) Temp Source: Oral (09/18 1950) BP: 148/86 (09/18 1950) Pulse Rate: 67 (09/18 1950)  Labs: Recent Labs    07/31/23 0717 07/31/23 0935 08/01/23 0820 08/01/23 1538 08/02/23 0945 08/02/23 2021  HGB 8.5*  --  8.1*  --  8.2*  --   HCT 28.3*  --  26.3*  --  26.7*  --   PLT 210  --  245  --  291  --   APTT >200*   < > 50* 33 54*  --   HEPARINUNFRC >1.10*  --  0.52 0.18* 0.43 0.68  CREATININE 1.46*  --  1.73*  --  1.44*  --    < > = values in this interval not displayed.    Estimated Creatinine Clearance: 48.7 mL/min (A) (by C-G formula based on SCr of 1.44 mg/dL (H)).   Medical History: Past Medical History:  Diagnosis Date   Alcohol abuse    Alcoholic cirrhosis ([name])    Anemia    Anxiety    Arthritis    "knees" (11/26/2018)   B12 deficiency    CAD (coronary artery disease)    a. 11/10/2014 Cath: LM nl, LAD min irregs, D1 30 ost, D2 50d, LCX 61m, OM1 80 p/m (1.5 mm vessel), OM2 44m, RCA nondom 24m-->med rx.. Demand ischemia in the setting of rapid a-fib.   Cardiomyopathy ([name])    Carotid artery disease ([name])    a. 01/2015 Carotid Angio: RICA 100, LICA 95p; b. 02/2015 s/p L CEA; c. 05/2019 Carotid U/S: RICA 100. RECA >50. LICA 1-39%.   Cellulitis    lower extremities   CHF (congestive heart failure) ([name])    Chronic combined systolic and diastolic CHF (congestive heart failure) ([name])    a. 10/2014 Echo: EF 40-45%; b. 10/2018 Echo: EF 45-50%, gr2 DD; c. 11/2019 Echo: EF 50%, mild LVH, gr2 DD  (restrictive), antlat HK, Nl RV fxn. Mild BAE. RVSP 59.59mmHg.   CKD (chronic kidney disease), stage III ([name])    Cocaine abuse ([name])    COPD (chronic obstructive pulmonary disease) ([name])    Critical lower limb ischemia ([name])    Depression    Diabetes mellitus without complication ([name])    Diabetic peripheral neuropathy ([name])    DVT (deep venous thrombosis) ([name])    Dyspnea    Elevated troponin    a. Chronic elevation.   GERD (gastroesophageal reflux disease)    Hyperlipemia    Hypertension    Hypokalemia    Hypomagnesemia    Hypothyroidism    Marijuana abuse    Narcotic abuse ([name])    Noncompliance    NSVT (nonsustained ventricular tachycardia) ([name])    Obesity    PAF (paroxysmal atrial fibrillation) ([name])    Paroxysmal atrial tachycardia    Peripheral arterial disease ([name])    a. 01/2015 Angio/PTA: RSFA 99 (atherectomy/pta) - 1 vessel runoff via diff dzs peroneal; b. 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   Pneumonia    "  once or twice" (11/26/2018)   Poorly controlled type 2 diabetes mellitus ([name])    Renal disorder    Renal insufficiency    a. 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   Sleep apnea    "couldn't handle wearing the mask" (11/26/2018)   Symptomatic bradycardia    a. Avoid AV blocking agent per EP. Prev req temp wire in 2017.   Tobacco abuse     Medications:  Scheduled:   arformoterol  15 mcg Nebulization BID   budesonide (PULMICORT) nebulizer solution  0.5 mg Nebulization BID   buPROPion ER  100 mg Oral Daily   Chlorhexidine Gluconate Cloth  6 each Topical Daily   insulin aspart  0-20 Units Subcutaneous TID WC   insulin aspart  0-5 Units Subcutaneous QHS   insulin aspart  5 Units Subcutaneous TID WC   insulin glargine-yfgn  38 Units Subcutaneous Daily   levothyroxine  50 mcg Oral QAC breakfast   metoprolol tartrate  12.5 mg Oral BID   nystatin cream   Topical BID   revefenacin  175 mcg Nebulization Daily   rosuvastatin  40 mg Oral Daily    torsemide  50 mg Oral BID   Infusions:   heparin 2,350 Units/hr (08/02/23 1235)    Assessment: 62 y.o. F presents with. On Eliquis pta for afib and was continued in hospital. Last dose 9/13 2130. To transition to heparin for angiogram early next week. Venous duplex of RLE negative for DVT. Eliquis will be affecting heparin levels so will utilize aPTT for monitoring until levels correlate.   9/18: Heparin level was therapeutic this AM at 0.43, and aPTT subtherapeutic at 54s following rate increase to 2,300 units/hour. Previous heparin level and aPTT were correlating, so will begin to titrate heparin off of heparin level   Level was drawn from same arm that heparin is infusing due to patient being a very hard stick, but drip was paused ~10 minutes prior to lab draw.  9/18 PM: heparin level 0.68 - at upper end of therapeutic range. Verified with phlebotomist that level was drawn from opposite arm of heparin infusion. No bleeding reported per RN.  Goal of Therapy:  Heparin level 0.3-0.7 Monitor platelets by anticoagulation protocol: Yes   Plan:  Slightly decrease heparin infusion to 2300 units/hr Daily heparin level and CBC  Loralee Pacas, PharmD, BCPS 08/02/2023 9:08 PM   Please check AMION for all [facility] phone numbers After 10:00 PM, call Main Pharmacy [phone]

## 2023-08-02 NOTE — Progress Notes (Signed)
[facility]                                                                               [place], is a 62 y.o. female, DOB - 1961/02/22, NGE:[id number] Admit date - 07/27/2023    Outpatient Primary MD for the patient is [name], MD  LOS - 6  days    Brief summary   [name] is a 62 y.o. female with past medical history significant for combined chronic systolic and diastolic congestive heart failure, HTN, PAD s/p right TMA, CAD, type 2 diabetes mellitus, COPD, CKD stage IIIb, gout, right foot ulcer, carotid stenosis s/p left CEA, paroxysmal atrial fibrillation on Eliquis, tobacco use disorder who presented to [facility] ED on 9/12 via EMS from home with complaints of confusion, weakness, lethargy, decreased urine output, and high blood sugar.  Patient was started on empiric antibiotics and continued on BiPAP.  Given her concern for decompensation, patient was admitted to the pulmonary critical care service for concerns of severe sepsis secondary to urinary tract infection, acute metabolic encephalopathy and respiratory failure requiring BiPAP.   Significant Hospital events: 9/12: Admit to ICU, IV fluids, IV antibiotics, BiPAP 9/13: Titrated off of BiPAP, remains on IV antibiotics, WBC count improving 9/14: Transferred to hospitalist service, diet advanced, holding Eliquis, starting heparin drip with plan for vascular surgery intervention this week   Assessment & Plan    Acute metabolic encephalopathy present on admission Probably secondary to hypoxia and urinary tract infection. She initially required BiPAP and was treated with empiric antibiotics for urinary tract infection. She was weaned off BiPAP and currently on room air. Mental status is currently at baseline.   Severe sepsis present on admission secondary to urinary tract infection Unfortunately urine culture was not sent on admission. Completed the course of IV Zosyn. Cultures negative till  date.    Mild acute COPD exacerbation No wheezing on exam, BiPAP was weaned off. Continue with Brovana, Pulmicort and Yupelri.    History of peripheral vascular disease with history of left common femoral to posterior tibial artery bypass In the setting of peripheral artery disease s/p right TMA and right foot ulcer . Surgery consulted and recommendations given plan for angiogram later this week. Patient is currently on IV heparin. Right lower extremity venous Dopplers negative for DVT.  Continue with Crestor at this time.     Chronic combined systolic and diastolic heart failure Because she appears to be compensated Echocardiogram in April 2024 showed preserved left ventricular ejection fraction without any regional wall abnormalities.  Patient has indeterminate left ventricular diastolic parameters. Recommend outpatient follow-up with cardiology on discharge as scheduled Meanwhile continue with torsemide at 50 mg twice daily, fluid restriction, strict intake and output and daily weights.    Poorly controlled type 2 diabetes mellitus with hyperglycemia  A1c around 11.1, Continue with Semglee and sliding scale insulin for now.    Hypothyroidism Resume levothyroxine 50 mcg daily.   Paroxysmal atrial fibrillation on anticoagulation Rate controlled on metoprolol 12.5 mg twice daily patient is on Eliquis at home for anticoagulation currently on IV angiogram later this week    Renal failure on stage IIIb CKD  with creatinine appears to be at baseline    Anemia of chronic disease Hemoglobin around 8 and stable  tobacco use disorder Counseled on cessation Abstinence   Body mass index is 39.2 kg/m. Obesity       Estimated body mass index is 39.2 kg/m as calculated from the following:   Height as of this encounter: 5\' 4"  (1.626 m).   Weight as of this encounter: 103.6 kg.  Code Status: full code.  DVT Prophylaxis:  Heparin.    Level of Care: Level of  care: Med-Surg Family Communication: none at bedside.   Disposition Plan:     Remains inpatient appropriate:  IV heparin.    Procedures:  None.   Consultants:   Vascular surgery  Antimicrobials:   Anti-infectives (From admission, onward)    Start     Dose/Rate Route Frequency Ordered Stop   07/27/23 1900  piperacillin-tazobactam (ZOSYN) IVPB 3.375 g        3.375 g 12.5 mL/hr over 240 Minutes Intravenous Every 8 hours 07/27/23 1809 08/01/23 1716   07/27/23 1245  ceFEPIme (MAXIPIME) 2 g in sodium chloride 0.9 % 100 mL IVPB        2 g 200 mL/hr over 30 Minutes Intravenous  Once 07/27/23 1240 07/27/23 1505   07/27/23 1245  vancomycin (VANCOCIN) IVPB 1000 mg/200 mL premix        1,000 mg 200 mL/hr over 60 Minutes Intravenous  Once 07/27/23 1240 07/27/23 1414        Medications  Scheduled Meds:  arformoterol  15 mcg Nebulization BID   budesonide (PULMICORT) nebulizer solution  0.5 mg Nebulization BID   buPROPion ER  100 mg Oral Daily   Chlorhexidine Gluconate Cloth  6 each Topical Daily   insulin aspart  0-20 Units Subcutaneous TID WC   insulin aspart  0-5 Units Subcutaneous QHS   insulin aspart  5 Units Subcutaneous TID WC   insulin glargine-yfgn  38 Units Subcutaneous Daily   levothyroxine  50 mcg Oral QAC breakfast   metoprolol tartrate  12.5 mg Oral BID   revefenacin  175 mcg Nebulization Daily   rosuvastatin  40 mg Oral Daily   torsemide  50 mg Oral BID   Continuous Infusions:  heparin 2,300 Units/hr (08/02/23 0557)   PRN Meds:.acetaminophen, albuterol, docusate sodium, ondansetron (ZOFRAN) IV, mouth rinse, oxyCODONE, polyethylene glycol, zolpidem    Subjective:   [name] was seen and examined today.  Reports having pain in the leg.   Objective:   Vitals:   08/01/23 1953 08/01/23 2107 08/02/23 0359 08/02/23 0816  BP:  (!) 121/45 (!) 158/83 (!) 151/63  Pulse: 90 60 71 (!) 59  Resp: 18     Temp:   99 F (37.2 C) 97.6 F (36.4 C)  TempSrc:   Oral  Oral  SpO2: 97% 97% 95% 100%  Weight:      Height:        Intake/Output Summary (Last 24 hours) at 08/02/2023 0926 Last data filed at 08/02/2023 0850 Gross per 24 hour  Intake 759.77 ml  Output 1050 ml  Net -290.23 ml   Filed Weights   07/29/23 0500 07/30/23 0500 07/31/23 0500  Weight: 101.3 kg 101.3 kg 103.6 kg     Exam General exam: Appears calm and comfortable  Respiratory system: Clear to auscultation. Respiratory effort normal. Cardiovascular system: S1 & S2 heard, RRR.  Gastrointestinal system: Abdomen is nondistended, soft and nontender.  Central nervous system: Alert and oriented. No focal neurological deficits.  Extremities: Symmetric 5 x 5 power. Skin: abdominal folds intertrigo Psychiatry: Judgement and insight appear normal. Mood & affect appropriate.     Data Reviewed:  I have personally reviewed following labs and imaging studies   CBC Lab Results  Component Value Date   WBC 11.6 (H) 08/01/2023   RBC 2.79 (L) 08/01/2023   HGB 8.1 (L) 08/01/2023   HCT 26.3 (L) 08/01/2023   MCV 94.3 08/01/2023   MCH 29.0 08/01/2023   PLT 245 08/01/2023   MCHC 30.8 08/01/2023   RDW 14.6 08/01/2023   LYMPHSABS 1.2 01/23/2023   MONOABS 0.9 01/23/2023   EOSABS 0.4 01/23/2023   BASOSABS 0.0 01/23/2023     Last metabolic panel Lab Results  Component Value Date   NA 135 08/01/2023   K 3.8 08/01/2023   CL 104 08/01/2023   CO2 18 (L) 08/01/2023   BUN 50 (H) 08/01/2023   CREATININE 1.73 (H) 08/01/2023   GLUCOSE 321 (H) 08/01/2023   GFRNONAA 33 (L) 08/01/2023   GFRAA 39 (L) 06/08/2020   CALCIUM 8.1 (L) 08/01/2023   PHOS 2.7 04/18/2022   PROT 7.0 07/27/2023   ALBUMIN 2.9 (L) 07/27/2023   LABGLOB 2.7 07/07/2022   AGRATIO 1.5 07/07/2022   BILITOT 0.3 07/27/2023   ALKPHOS 154 (H) 07/27/2023   AST 26 07/27/2023   ALT 27 07/27/2023   ANIONGAP 13 08/01/2023    CBG (last 3)  Recent Labs    08/01/23 1715 08/01/23 2103 08/02/23 0817  GLUCAP 113* 140* 171*       Coagulation Profile: No results for input(s): "INR", "PROTIME" in the last 168 hours.   Radiology Studies: No results found.     Kathlen Mody M.D. [facility] 08/02/2023, 9:26 AM  Available via Epic secure chat 7am-7pm After 7 pm, please refer to night coverage provider listed on amion.

## 2023-08-03 ENCOUNTER — Telehealth: Payer: Self-pay | Admitting: Family Medicine

## 2023-08-03 ENCOUNTER — Telehealth: Payer: Self-pay

## 2023-08-03 DIAGNOSIS — N390 Urinary tract infection, site not specified: Secondary | ICD-10-CM | POA: Diagnosis not present

## 2023-08-03 DIAGNOSIS — A419 Sepsis, unspecified organism: Secondary | ICD-10-CM | POA: Diagnosis not present

## 2023-08-03 DIAGNOSIS — G9340 Encephalopathy, unspecified: Secondary | ICD-10-CM | POA: Diagnosis not present

## 2023-08-03 DIAGNOSIS — N179 Acute kidney failure, unspecified: Secondary | ICD-10-CM | POA: Diagnosis not present

## 2023-08-03 LAB — CBC WITH DIFFERENTIAL/PLATELET
Abs Immature Granulocytes: 0.66 10*3/uL — ABNORMAL HIGH (ref 0.00–0.07)
Basophils Absolute: 0 10*3/uL (ref 0.0–0.1)
Basophils Relative: 0 %
Eosinophils Absolute: 0.4 10*3/uL (ref 0.0–0.5)
Eosinophils Relative: 3 %
HCT: 25.6 % — ABNORMAL LOW (ref 36.0–46.0)
Hemoglobin: 7.9 g/dL — ABNORMAL LOW (ref 12.0–15.0)
Immature Granulocytes: 5 %
Lymphocytes Relative: 15 %
Lymphs Abs: 2 10*3/uL (ref 0.7–4.0)
MCH: 28.8 pg (ref 26.0–34.0)
MCHC: 30.9 g/dL (ref 30.0–36.0)
MCV: 93.4 fL (ref 80.0–100.0)
Monocytes Absolute: 0.7 10*3/uL (ref 0.1–1.0)
Monocytes Relative: 5 %
Neutro Abs: 9.8 10*3/uL — ABNORMAL HIGH (ref 1.7–7.7)
Neutrophils Relative %: 72 %
Platelets: 325 10*3/uL (ref 150–400)
RBC: 2.74 MIL/uL — ABNORMAL LOW (ref 3.87–5.11)
RDW: 14.7 % (ref 11.5–15.5)
WBC: 13.5 10*3/uL — ABNORMAL HIGH (ref 4.0–10.5)
nRBC: 0 % (ref 0.0–0.2)

## 2023-08-03 LAB — GLUCOSE, CAPILLARY
Glucose-Capillary: 119 mg/dL — ABNORMAL HIGH (ref 70–99)
Glucose-Capillary: 236 mg/dL — ABNORMAL HIGH (ref 70–99)
Glucose-Capillary: 204 mg/dL — ABNORMAL HIGH (ref 70–99)
Glucose-Capillary: 125 mg/dL — ABNORMAL HIGH (ref 70–99)
Glucose-Capillary: 166 mg/dL — ABNORMAL HIGH (ref 70–99)

## 2023-08-03 LAB — HEPARIN LEVEL (UNFRACTIONATED): Heparin Unfractionated: 0.56 IU/mL (ref 0.30–0.70)

## 2023-08-03 MED ORDER — APIXABAN 5 MG PO TABS
5.0000 mg | ORAL_TABLET | Freq: Two times a day (BID) | ORAL | Status: DC
  Administered 2023-08-03 – 2023-08-05 (×5): 5 mg via ORAL
  Filled 2023-08-03 (×5): qty 1

## 2023-08-03 NOTE — Progress Notes (Signed)
Occupational Therapy Treatment Patient Details Name: [name] MRN: [id number] DOB: 02/12/1961 Today's Date: 08/03/2023   History of present illness Pt is a 62 y/o F admitted on 07/27/23 after presenting with c/o confusion, weakness, lethargy, decreased urine output & high blood sugar. Pt admitted for severe sepsis 2/2 UTI, acute metabolic encephalopathy & respiratory failure requiring BiPAP. PMH: combined chronic systolic & diastolic CHF, HTN, PAD s/p R TMA, CAD, DM2, COPD, CKD 3B, gout, R foot ulcer, carotid stenosis s/p L CEA, paroxysmal a-fib on Eliquis, tobacco use disorder   OT comments  Pt with slow progress toward OT goals this session; limited by RLE pain. Pt needing supervision A for transfer to/from [facility] and crying in pain throughout. Pt with premature sit requiring cue sto keep turning prior to sit. Min A to don post-op shoes secondary to pain and bowel urgency. Wheelchair in room and encouraging pt to mobilize in chair but pt reporting too much pain. Will continue to follow. Updated discharge to home with no follow up as pt mobilizing without help and no PT at home, thus, cannot get OT.       If plan is discharge home, recommend the following:  A little help with walking and/or transfers;A little help with bathing/dressing/bathroom;Help with stairs or ramp for entrance;Assist for transportation;Assistance with cooking/housework   Equipment Recommendations  None recommended by OT    Recommendations for Other Services      Precautions / Restrictions Precautions Precautions: Fall Restrictions Weight Bearing Restrictions: No       Mobility Bed Mobility Overal bed mobility: Needs Assistance Bed Mobility: Supine to Sit, Sit to Supine     Supine to sit: Modified independent (Device/Increase time) Sit to supine: Modified independent (Device/Increase time)   General bed mobility comments: signficantly increased time    Transfers Overall transfer level: Needs  assistance Equipment used: Rolling walker (2 wheels) Transfers: Sit to/from Stand Sit to Stand: Supervision           General transfer comment: for safety; use of momentum     Balance Overall balance assessment: Needs assistance Sitting-balance support: No upper extremity supported, Feet supported Sitting balance-Leahy Scale: Good     Standing balance support: Bilateral upper extremity supported, During functional activity, Reliant on assistive device for balance Standing balance-Leahy Scale: Fair Standing balance comment: reliant on RW                           ADL either performed or assessed with clinical judgement   ADL Overall ADL's : Needs assistance/impaired                     Lower Body Dressing: Minimal assistance;Sitting/lateral leans Lower Body Dressing Details (indicate cue type and reason): to don post op shoes Toilet Transfer: Modified Independent;Rolling walker (2 wheels);Stand-pivot Statistician Details (indicate cue type and reason): SPT to [facility]           General ADL Comments: Limited by pain    Extremity/Trunk Assessment Upper Extremity Assessment Upper Extremity Assessment: Generalized weakness   Lower Extremity Assessment Lower Extremity Assessment: Defer to PT evaluation        Vision       Perception Perception Perception: Not tested   Praxis Praxis Praxis: Not tested    Cognition Arousal: Alert Behavior During Therapy: [facility] for tasks assessed/performed Overall Cognitive Status: No family/caregiver present to determine baseline cognitive functioning  General Comments: Oriented and pleasant. Perhaps decr health management strategies at home, needing continued education on importance of mobility        Exercises      Shoulder Instructions       General Comments BLE post op shoes    Pertinent Vitals/ Pain       Pain Assessment Pain Assessment: 0-10 Pain  Score: 8  Pain Location: R foot/heel Pain Descriptors / Indicators: Pins and needles Pain Intervention(s): Limited activity within patient's tolerance, Monitored during session  Home Living                                          Prior Functioning/Environment              Frequency  Min 1X/week        Progress Toward Goals  OT Goals(current goals can now be found in the care plan section)  Progress towards OT goals: Progressing toward goals  Acute Rehab OT Goals Patient Stated Goal: get better OT Goal Formulation: With patient Time For Goal Achievement: 08/13/23 Potential to Achieve Goals: Good ADL Goals Pt Will Perform Grooming: with modified independence;standing Pt Will Perform Lower Body Dressing: with modified independence;sit to/from stand;sitting/lateral leans Pt Will Transfer to Toilet: with modified independence;ambulating;regular height toilet Additional ADL Goal #1: Pt will perform 8+ mins OOB ADl to optimize safety and independence in ADL and IADL  Plan      Co-evaluation                 AM-PAC OT "6 Clicks" Daily Activity     Outcome Measure   Help from another person eating meals?: None Help from another person taking care of personal grooming?: A Little Help from another person toileting, which includes using toliet, bedpan, or urinal?: A Little Help from another person bathing (including washing, rinsing, drying)?: A Little Help from another person to put on and taking off regular upper body clothing?: A Little Help from another person to put on and taking off regular lower body clothing?: A Little 6 Click Score: 19    End of Session Equipment Utilized During Treatment: Gait belt;Rolling walker (2 wheels)  OT Visit Diagnosis: Unsteadiness on feet (R26.81);Muscle weakness (generalized) (M62.81);Other symptoms and signs involving cognitive function   Activity Tolerance Patient tolerated treatment well   Patient Left  with call bell/phone within reach;in bed;with bed alarm set   Nurse Communication Mobility status        Time: 7829-5621 OT Time Calculation (min): 20 min  Charges: OT General Charges $OT Visit: 1 Visit OT Treatments $Self Care/Home Management : 8-22 mins  Tyler Deis, OTR/L [facility] Acute Rehabilitation Office: [phone]   Myrla Halsted 08/03/2023, 8:55 AM

## 2023-08-03 NOTE — Inpatient Diabetes Management (Signed)
Inpatient Diabetes Program Recommendations  AACE/ADA: New Consensus Statement on Inpatient Glycemic Control (2015)  Target Ranges:  Prepandial:   less than 140 mg/dL      Peak postprandial:   less than 180 mg/dL (1-2 hours)      Critically ill patients:  140 - 180 mg/dL   Lab Results  Component Value Date   GLUCAP 204 (H) 08/03/2023   HGBA1C 11.1 (H) 07/27/2023    Review of Glycemic Control  Latest Reference Range & Units 08/02/23 08:17 08/02/23 12:10 08/02/23 17:16 08/02/23 21:24 08/03/23 07:25 08/03/23 11:26 08/03/23 12:11  Glucose-Capillary 70 - 99 mg/dL 782 (H) 956 (H) 213 (H) 120 (H) 119 (H) 236 (H) 204 (H)   Diabetes history: DM 2 Outpatient Diabetes medications: Lantus 38 units Daily, Humalog 10 units tid meal coverage Current orders for Inpatient glycemic control:  Semglee 38 units Daily Novolog 0-20 units tid + hs Novolog 5 units tid meal coverage  A1c 11.1 % on 9/12  Spoke with pt at bedside regarding A1c of 11.1% and glucose control at home. Pt reports compliance to her medications and glucose checks 3-4 times a day. Pt is not always able to be consistent with her diet due to affordability and has been getting meals on wheels, which do not have a modified diet. Discussed glucose and A1c goals. Encouraged pt to call her PCP every few days for insulin adjustments if not at goal 150 all the time.  Thanks,  Christena Deem RN, MSN, BC-ADM Inpatient Diabetes Coordinator Team Pager [phone] (8a-5p)

## 2023-08-03 NOTE — Telephone Encounter (Signed)
Copied from CRM [id number]. Topic: General - Other >> Aug 03, 2023 11:53 AM Epimenio Foot F wrote: Reason for CRM: [employer] is calling in because they sent a fax over to the office requesting medication for the pt and wanted to know the status of it.

## 2023-08-03 NOTE — Telephone Encounter (Signed)
[name] from [employer] [facility], says she is refaxing med refill request today, one of which is Ozempic, not showing on med list

## 2023-08-03 NOTE — Progress Notes (Signed)
ANTICOAGULATION CONSULT NOTE    Pharmacy Consult for Eliquis ->Heparin Indication:  PAF (holding Eliquis for  angiogram  Allergies  Allergen Reactions   Gabapentin Nausea And Vomiting and Other (See Comments)    POSSIBLE SHAKING   Lyrica [Pregabalin] Other (See Comments)    Shaking     Patient Measurements: Height: 5\' 4"  (162.6 cm) Weight: 107.2 kg (236 lb 5.3 oz) IBW/kg (Calculated) : 54.7 Heparin Dosing Weight: 88 kg  Vital Signs: Temp: 98 F (36.7 C) (09/19 0830) Temp Source: Oral (09/19 0830) BP: 145/58 (09/19 0830) Pulse Rate: 56 (09/19 0830)  Labs: Recent Labs    08/01/23 0820 08/01/23 1538 08/02/23 0945 08/02/23 2021 08/03/23 0444  HGB 8.1*  --  8.2*  --   --   HCT 26.3*  --  26.7*  --   --   PLT 245  --  291  --   --   APTT 50* 33 54*  --   --   HEPARINUNFRC 0.52 0.18* 0.43 0.68 0.56  CREATININE 1.73*  --  1.44*  --   --     Estimated Creatinine Clearance: 49.6 mL/min (A) (by C-G formula based on SCr of 1.44 mg/dL (H)).   Medical History: Past Medical History:  Diagnosis Date   Alcohol abuse    Alcoholic cirrhosis ([name])    Anemia    Anxiety    Arthritis    "knees" (11/26/2018)   B12 deficiency    CAD (coronary artery disease)    a. 11/10/2014 Cath: LM nl, LAD min irregs, D1 30 ost, D2 50d, LCX 88m, OM1 80 p/m (1.5 mm vessel), OM2 34m, RCA nondom 56m-->med rx.. Demand ischemia in the setting of rapid a-fib.   Cardiomyopathy ([name])    Carotid artery disease ([name])    a. 01/2015 Carotid Angio: RICA 100, LICA 95p; b. 02/2015 s/p L CEA; c. 05/2019 Carotid U/S: RICA 100. RECA >50. LICA 1-39%.   Cellulitis    lower extremities   CHF (congestive heart failure) ([name])    Chronic combined systolic and diastolic CHF (congestive heart failure) ([name])    a. 10/2014 Echo: EF 40-45%; b. 10/2018 Echo: EF 45-50%, gr2 DD; c. 11/2019 Echo: EF 50%, mild LVH, gr2 DD (restrictive), antlat HK, Nl RV fxn. Mild BAE. RVSP 59.26mmHg.   CKD (chronic kidney disease), stage III  ([name])    Cocaine abuse ([name])    COPD (chronic obstructive pulmonary disease) ([name])    Critical lower limb ischemia ([name])    Depression    Diabetes mellitus without complication ([name])    Diabetic peripheral neuropathy ([name])    DVT (deep venous thrombosis) ([name])    Dyspnea    Elevated troponin    a. Chronic elevation.   GERD (gastroesophageal reflux disease)    Hyperlipemia    Hypertension    Hypokalemia    Hypomagnesemia    Hypothyroidism    Marijuana abuse    Narcotic abuse ([name])    Noncompliance    NSVT (nonsustained ventricular tachycardia) ([name])    Obesity    PAF (paroxysmal atrial fibrillation) ([name])    Paroxysmal atrial tachycardia    Peripheral arterial disease ([name])    a. 01/2015 Angio/PTA: RSFA 99 (atherectomy/pta) - 1 vessel runoff via diff dzs peroneal; b. 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   Pneumonia    "once or twice" (11/26/2018)   Poorly controlled type 2 diabetes mellitus ([name])    Renal disorder  Renal insufficiency    a. 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   Sleep apnea    "couldn't handle wearing the mask" (11/26/2018)   Symptomatic bradycardia    a. Avoid AV blocking agent per EP. Prev req temp wire in 2017.   Tobacco abuse     Medications:  Scheduled:   arformoterol  15 mcg Nebulization BID   budesonide (PULMICORT) nebulizer solution  0.5 mg Nebulization BID   buPROPion ER  100 mg Oral Daily   Chlorhexidine Gluconate Cloth  6 each Topical Daily   insulin aspart  0-20 Units Subcutaneous TID WC   insulin aspart  0-5 Units Subcutaneous QHS   insulin aspart  5 Units Subcutaneous TID WC   insulin glargine-yfgn  38 Units Subcutaneous Daily   levothyroxine  50 mcg Oral QAC breakfast   metoprolol tartrate  12.5 mg Oral BID   nystatin cream   Topical BID   revefenacin  175 mcg Nebulization Daily   rosuvastatin  40 mg Oral Daily   torsemide  50 mg Oral BID   Infusions:   heparin 2,300 Units/hr (08/03/23 0308)    Assessment: 62 y.o. F  presents with. On Eliquis pta for afib and was continued in hospital. Last dose 9/13 2130. To transition to heparin for angiogram early next week. Venous duplex of RLE negative for DVT. Eliquis will be affecting heparin levels so will utilize aPTT for monitoring until levels correlate.   9/19: consult placed to transition heparin to apixaban for atrial fibrillation. No signs of bleeding noted. Hgb stable.   Goal of Therapy:  Heparin level 0.3-0.7 Monitor platelets by anticoagulation protocol: Yes   Plan:  STOP heparin infusion  START apixaban 5 mg PO BID- administer fist dose of apixaban at same time heparin infusion is stopped  Will sign off apixaban consult at this time   Jani Gravel, PharmD Clinical Pharmacist  08/03/2023 11:09 AM

## 2023-08-03 NOTE — Progress Notes (Signed)
[facility]                                                                               [place], is a 62 y.o. female, DOB - [birth year], WUJ:[id number] Admit date - 07/27/2023    Outpatient Primary MD for the patient is [name], MD  LOS - 7  days    Brief summary   [name] is a 62 y.o. female with past medical history significant for combined chronic systolic and diastolic congestive heart failure, HTN, PAD s/p right TMA, CAD, type 2 diabetes mellitus, COPD, CKD stage IIIb, gout, right foot ulcer, carotid stenosis s/p left CEA, paroxysmal atrial fibrillation on Eliquis, tobacco use disorder who presented to [facility] ED on 9/12 via EMS from home with complaints of confusion, weakness, lethargy, decreased urine output, and high blood sugar.  Patient was started on empiric antibiotics and continued on BiPAP.  Given her concern for decompensation, patient was admitted to the pulmonary critical care service for concerns of severe sepsis secondary to urinary tract infection, acute metabolic encephalopathy and respiratory failure requiring BiPAP.   Significant Hospital events: 9/12: Admit to ICU, IV fluids, IV antibiotics, BiPAP 9/13: Titrated off of BiPAP, remains on IV antibiotics, WBC count improving 9/14: Transferred to hospitalist service, diet advanced,  9/19: Completed antibiotics, plan for angiogram next week on 9/27.  Currently waiting for pain control for discharge home.    Assessment & Plan    Acute metabolic encephalopathy present on admission Probably secondary to hypoxia and urinary tract infection. She initially required BiPAP and was treated with empiric antibiotics for urinary tract infection. She was weaned off BiPAP and currently on room air. Mental status is currently at baseline.   Severe sepsis present on admission secondary to urinary tract infection Unfortunately urine culture was not sent on admission. Completed the course of IV  Zosyn. Cultures negative till date.    Mild acute COPD exacerbation No wheezing on exam, BiPAP was weaned off. Continue with Brovana, Pulmicort and Yupelri.    History of peripheral vascular disease with history of left common femoral to posterior tibial artery bypass In the setting of peripheral artery disease s/p right TMA and right foot ulcer Vascular surgery consulted, plan for angiogram next week on 9/27.  Patient started on IV heparin, switched to eliquis as its going to be next week.  Right lower extremity venous Dopplers negative for DVT. Continue with Crestor at this time.     Chronic combined systolic and diastolic heart failure Because she appears to be compensated Echocardiogram in April 2024 showed preserved left ventricular ejection fraction without any regional wall abnormalities.  Patient has indeterminate left ventricular diastolic parameters. Recommend outpatient follow-up with cardiology on discharge as scheduled Meanwhile continue with torsemide at 50 mg twice daily, fluid restriction, strict intake and output and daily weights.    Poorly controlled type 2 diabetes mellitus with hyperglycemia  A1c around 11.1, Continue with Semglee and sliding scale insulin for now. CBG (last 3)  Recent Labs    08/03/23 1126 08/03/23 1211 08/03/23 1603  GLUCAP 236* 204* 125*       Hypothyroidism Resume levothyroxine 50 mcg daily.  Paroxysmal atrial fibrillation on anticoagulation Rate controlled on metoprolol 12.5 mg twice daily patient is on Eliquis at home for anticoagulation.    Renal failure on stage IIIb CKD with creatinine appears to be at baseline    Anemia of chronic disease Hemoglobin around 8 and stable  tobacco use disorder Counseled on cessation Abstinence   Body mass index is 40.57 kg/m. Obesity       Estimated body mass index is 40.57 kg/m as calculated from the following:   Height as of this encounter: 5\' 4"  (1.626 m).    Weight as of this encounter: 107.2 kg.  Code Status: full code.  DVT Prophylaxis:  Heparin.  apixaban (ELIQUIS) tablet 5 mg   Level of Care: Level of care: Med-Surg Family Communication: none at bedside.   Disposition Plan:     Remains inpatient appropriate: possible d/c in am.   Procedures:  None.   Consultants:   Vascular surgery  Antimicrobials:   Anti-infectives (From admission, onward)    Start     Dose/Rate Route Frequency Ordered Stop   08/02/23 1045  fluconazole (DIFLUCAN) tablet 200 mg        200 mg Oral  Once 08/02/23 0949 08/02/23 1231   07/27/23 1900  piperacillin-tazobactam (ZOSYN) IVPB 3.375 g        3.375 g 12.5 mL/hr over 240 Minutes Intravenous Every 8 hours 07/27/23 1809 08/01/23 1716   07/27/23 1245  ceFEPIme (MAXIPIME) 2 g in sodium chloride 0.9 % 100 mL IVPB        2 g 200 mL/hr over 30 Minutes Intravenous  Once 07/27/23 1240 07/27/23 1505   07/27/23 1245  vancomycin (VANCOCIN) IVPB 1000 mg/200 mL premix        1,000 mg 200 mL/hr over 60 Minutes Intravenous  Once 07/27/23 1240 07/27/23 1414        Medications  Scheduled Meds:  apixaban  5 mg Oral BID   arformoterol  15 mcg Nebulization BID   budesonide (PULMICORT) nebulizer solution  0.5 mg Nebulization BID   buPROPion ER  100 mg Oral Daily   Chlorhexidine Gluconate Cloth  6 each Topical Daily   insulin aspart  0-20 Units Subcutaneous TID WC   insulin aspart  0-5 Units Subcutaneous QHS   insulin aspart  5 Units Subcutaneous TID WC   insulin glargine-yfgn  38 Units Subcutaneous Daily   levothyroxine  50 mcg Oral QAC breakfast   metoprolol tartrate  12.5 mg Oral BID   nystatin cream   Topical BID   revefenacin  175 mcg Nebulization Daily   rosuvastatin  40 mg Oral Daily   torsemide  50 mg Oral BID   Continuous Infusions:   PRN Meds:.acetaminophen, albuterol, docusate sodium, ondansetron (ZOFRAN) IV, mouth rinse, oxyCODONE, polyethylene glycol, zolpidem    Subjective:   [name]  was seen and examined today.  Pain controlled with pain meds.   Objective:   Vitals:   08/03/23 0352 08/03/23 0825 08/03/23 0830 08/03/23 1603  BP: (!) 121/46  (!) 145/58 (!) 146/57  Pulse: (!) 58 (!) 55 (!) 56 61  Resp: 19 19    Temp: 98.6 F (37 C)  98 F (36.7 C) 98.3 F (36.8 C)  TempSrc: Oral  Oral   SpO2: 93% 94% 100%   Weight:      Height:        Intake/Output Summary (Last 24 hours) at 08/03/2023 1727 Last data filed at 08/03/2023 0308 Gross per 24 hour  Intake 612.84 ml  Output --  Net 612.84 ml   Filed Weights   07/30/23 0500 07/31/23 0500 08/03/23 0351  Weight: 101.3 kg 103.6 kg 107.2 kg     Exam General exam: Appears calm and comfortable  Respiratory system: Clear to auscultation. Respiratory effort normal. Cardiovascular system: S1 & S2 heard, RRR. No JVD, Gastrointestinal system: Abdomen is nondistended, soft and nontender.  Central nervous system: Alert and oriented. No focal neurological deficits. Extremities: rle tenderness.  Skin: No rashes,  Psychiatry: Mood & affect appropriate.     Data Reviewed:  I have personally reviewed following labs and imaging studies   CBC Lab Results  Component Value Date   WBC 13.5 (H) 08/03/2023   RBC 2.74 (L) 08/03/2023   HGB 7.9 (L) 08/03/2023   HCT 25.6 (L) 08/03/2023   MCV 93.4 08/03/2023   MCH 28.8 08/03/2023   PLT 325 08/03/2023   MCHC 30.9 08/03/2023   RDW 14.7 08/03/2023   LYMPHSABS 2.0 08/03/2023   MONOABS 0.7 08/03/2023   EOSABS 0.4 08/03/2023   BASOSABS 0.0 08/03/2023     Last metabolic panel Lab Results  Component Value Date   NA 137 08/02/2023   K 3.8 08/02/2023   CL 105 08/02/2023   CO2 22 08/02/2023   BUN 51 (H) 08/02/2023   CREATININE 1.44 (H) 08/02/2023   GLUCOSE 156 (H) 08/02/2023   GFRNONAA 42 (L) 08/02/2023   GFRAA 39 (L) 06/08/2020   CALCIUM 8.5 (L) 08/02/2023   PHOS 2.7 04/18/2022   PROT 7.0 07/27/2023   ALBUMIN 2.9 (L) 07/27/2023   LABGLOB 2.7 07/07/2022   AGRATIO  1.5 07/07/2022   BILITOT 0.3 07/27/2023   ALKPHOS 154 (H) 07/27/2023   AST 26 07/27/2023   ALT 27 07/27/2023   ANIONGAP 10 08/02/2023    CBG (last 3)  Recent Labs    08/03/23 1126 08/03/23 1211 08/03/23 1603  GLUCAP 236* 204* 125*      Coagulation Profile: No results for input(s): "INR", "PROTIME" in the last 168 hours.   Radiology Studies: No results found.     Kathlen Mody M.D. [facility] 08/03/2023, 5:27 PM  Available via Epic secure chat 7am-7pm After 7 pm, please refer to night coverage provider listed on amion.

## 2023-08-03 NOTE — Telephone Encounter (Signed)
Form has been received and patient needs to schedule an appointment with PCP for refills.

## 2023-08-03 NOTE — Plan of Care (Signed)
Problem: Education: Goal: Ability to describe self-care measures that may prevent or decrease complications (Diabetes Survival Skills Education) will improve Outcome: Progressing Goal: Individualized Educational Video(s) Outcome: Progressing   Problem: Coping: Goal: Ability to adjust to condition or change in health will improve Outcome: Progressing   Problem: Fluid Volume: Goal: Ability to maintain a balanced intake and output will improve Outcome: Progressing   Problem: Health Behavior/Discharge Planning: Goal: Ability to identify and utilize available resources and services will improve Outcome: Progressing Goal: Ability to manage health-related needs will improve Outcome: Progressing   Problem: Metabolic: Goal: Ability to maintain appropriate glucose levels will improve Outcome: Progressing   Problem: Nutritional: Goal: Maintenance of adequate nutrition will improve Outcome: Progressing Goal: Progress toward achieving an optimal weight will improve Outcome: Progressing   Problem: Skin Integrity: Goal: Risk for impaired skin integrity will decrease Outcome: Progressing   Problem: Tissue Perfusion: Goal: Adequacy of tissue perfusion will improve Outcome: Progressing   Problem: Education: Goal: Knowledge of General Education information will improve Description: Including pain rating scale, medication(s)/side effects and non-pharmacologic comfort measures Outcome: Progressing   Problem: Health Behavior/Discharge Planning: Goal: Ability to manage health-related needs will improve Outcome: Progressing   Problem: Clinical Measurements: Goal: Ability to maintain clinical measurements within normal limits will improve Outcome: Progressing Goal: Will remain free from infection Outcome: Progressing Goal: Diagnostic test results will improve Outcome: Progressing Goal: Respiratory complications will improve Outcome: Progressing Goal: Cardiovascular complication will  be avoided Outcome: Progressing   Problem: Activity: Goal: Risk for activity intolerance will decrease Outcome: Progressing

## 2023-08-03 NOTE — Telephone Encounter (Signed)
Noted  

## 2023-08-04 ENCOUNTER — Inpatient Hospital Stay (HOSPITAL_COMMUNITY): Payer: 59

## 2023-08-04 DIAGNOSIS — N390 Urinary tract infection, site not specified: Secondary | ICD-10-CM | POA: Diagnosis not present

## 2023-08-04 DIAGNOSIS — A419 Sepsis, unspecified organism: Secondary | ICD-10-CM | POA: Diagnosis not present

## 2023-08-04 DIAGNOSIS — N179 Acute kidney failure, unspecified: Secondary | ICD-10-CM | POA: Diagnosis not present

## 2023-08-04 DIAGNOSIS — G9340 Encephalopathy, unspecified: Secondary | ICD-10-CM | POA: Diagnosis not present

## 2023-08-04 LAB — GLUCOSE, CAPILLARY
Glucose-Capillary: 173 mg/dL — ABNORMAL HIGH (ref 70–99)
Glucose-Capillary: 143 mg/dL — ABNORMAL HIGH (ref 70–99)
Glucose-Capillary: 258 mg/dL — ABNORMAL HIGH (ref 70–99)
Glucose-Capillary: 260 mg/dL — ABNORMAL HIGH (ref 70–99)
Glucose-Capillary: 118 mg/dL — ABNORMAL HIGH (ref 70–99)

## 2023-08-04 MED ORDER — COLCHICINE 0.6 MG PO TABS
0.6000 mg | ORAL_TABLET | Freq: Every day | ORAL | Status: DC
  Administered 2023-08-04 – 2023-08-05 (×2): 0.6 mg via ORAL
  Filled 2023-08-04 (×2): qty 1

## 2023-08-04 MED ORDER — PREDNISONE 10 MG PO TABS
40.0000 mg | ORAL_TABLET | Freq: Every day | ORAL | Status: DC
  Administered 2023-08-04 – 2023-08-05 (×2): 40 mg via ORAL
  Filled 2023-08-04 (×3): qty 4

## 2023-08-04 NOTE — Plan of Care (Signed)
  Problem: Nutritional: Goal: Maintenance of adequate nutrition will improve Outcome: Progressing   Problem: Skin Integrity: Goal: Risk for impaired skin integrity will decrease Outcome: Progressing   Problem: Tissue Perfusion: Goal: Adequacy of tissue perfusion will improve Outcome: Progressing   Problem: Clinical Measurements: Goal: Ability to maintain clinical measurements within normal limits will improve Outcome: Progressing Goal: Will remain free from infection Outcome: Progressing Goal: Diagnostic test results will improve Outcome: Progressing Goal: Respiratory complications will improve Outcome: Progressing Goal: Cardiovascular complication will be avoided Outcome: Progressing   Problem: Activity: Goal: Risk for activity intolerance will decrease Outcome: Progressing   Problem: Nutrition: Goal: Adequate nutrition will be maintained Outcome: Progressing   Problem: Elimination: Goal: Will not experience complications related to urinary retention Outcome: Progressing   Problem: Safety: Goal: Ability to remain free from injury will improve Outcome: Progressing   Problem: Skin Integrity: Goal: Risk for impaired skin integrity will decrease Outcome: Progressing

## 2023-08-04 NOTE — Progress Notes (Signed)
[facility]                                                                               [place], is a 62 y.o. female, DOB - August 14, 1961, WUJ:[id number] Admit date - 07/27/2023    Outpatient Primary MD for the patient is [name], MD  LOS - 8  days    Brief summary   [name] is a 62 y.o. female with past medical history significant for combined chronic systolic and diastolic congestive heart failure, HTN, PAD s/p right TMA, CAD, type 2 diabetes mellitus, COPD, CKD stage IIIb, gout, right foot ulcer, carotid stenosis s/p left CEA, paroxysmal atrial fibrillation on Eliquis, tobacco use disorder who presented to [facility] ED on 9/12 via EMS from home with complaints of confusion, weakness, lethargy, decreased urine output, and high blood sugar.  Patient was started on empiric antibiotics and continued on BiPAP.  Given her concern for decompensation, patient was admitted to the pulmonary critical care service for concerns of severe sepsis secondary to urinary tract infection, acute metabolic encephalopathy and respiratory failure requiring BiPAP.   Significant Hospital events: 9/12: Admit to ICU, IV fluids, IV antibiotics, BiPAP 9/13: Titrated off of BiPAP, remains on IV antibiotics, WBC count improving 9/14: Transferred to hospitalist service, diet advanced,  9/19: Completed antibiotics, plan for angiogram next week on 9/27.  Currently waiting for pain control for discharge home.    Assessment & Plan    Acute metabolic encephalopathy present on admission Probably secondary to hypoxia and urinary tract infection. She initially required BiPAP and was treated with empiric antibiotics for urinary tract infection. She was weaned off BiPAP and currently on room air. Mental status is currently at baseline.   Severe sepsis present on admission secondary to urinary tract infection Unfortunately urine culture was not sent on admission. Completed the course of IV  Zosyn. Cultures negative till date.    Mild acute COPD exacerbation No wheezing on exam, BiPAP was weaned off. Continue with Brovana, Pulmicort and Yupelri.    History of peripheral vascular disease with history of left common femoral to posterior tibial artery bypass In the setting of peripheral artery disease s/p right TMA and right foot ulcer Vascular surgery consulted, plan for angiogram next week on 9/27.  Patient started on IV heparin, switched to eliquis as its going to be next week.  Right lower extremity venous Dopplers negative for DVT. Continue with Crestor at this time.     Chronic combined systolic and diastolic heart failure Because she appears to be compensated Echocardiogram in April 2024 showed preserved left ventricular ejection fraction without any regional wall abnormalities.  Patient has indeterminate left ventricular diastolic parameters. Recommend outpatient follow-up with cardiology on discharge as scheduled Meanwhile continue with torsemide at 50 mg twice daily, fluid restriction, strict intake and output and daily weights.    Poorly controlled type 2 diabetes mellitus with hyperglycemia  A1c around 11.1, Continue with Semglee and sliding scale insulin for now. CBG (last 3)  Recent Labs    08/03/23 2032 08/04/23 0432 08/04/23 0650  GLUCAP 166* 173* 143*       Hypothyroidism Resume levothyroxine 50 mcg daily.  Paroxysmal atrial fibrillation on anticoagulation Rate controlled on metoprolol 12.5 mg twice daily patient is on Eliquis at home for anticoagulation.    Renal failure on stage IIIb CKD with creatinine appears to be at baseline    Anemia of chronic disease Hemoglobin around 8 and stable  tobacco use disorder Counseled on cessation Abstinence   Body mass index is 40.57 kg/m. Obesity   Right wrist swelling and tenderness:  ? Gouty flare up.  X rays ordered and started her on colchicine and prednisone.       Estimated body mass index is 40.57 kg/m as calculated from the following:   Height as of this encounter: 5\' 4"  (1.626 m).   Weight as of this encounter: 107.2 kg.  Code Status: full code.  DVT Prophylaxis:  Heparin.  apixaban (ELIQUIS) tablet 5 mg   Level of Care: Level of care: Med-Surg Family Communication: none at bedside.   Disposition Plan:     Remains inpatient appropriate: possible d/c in am.   Procedures:  None.   Consultants:   Vascular surgery  Antimicrobials:   Anti-infectives (From admission, onward)    Start     Dose/Rate Route Frequency Ordered Stop   08/02/23 1045  fluconazole (DIFLUCAN) tablet 200 mg        200 mg Oral  Once 08/02/23 0949 08/02/23 1231   07/27/23 1900  piperacillin-tazobactam (ZOSYN) IVPB 3.375 g        3.375 g 12.5 mL/hr over 240 Minutes Intravenous Every 8 hours 07/27/23 1809 08/01/23 1716   07/27/23 1245  ceFEPIme (MAXIPIME) 2 g in sodium chloride 0.9 % 100 mL IVPB        2 g 200 mL/hr over 30 Minutes Intravenous  Once 07/27/23 1240 07/27/23 1505   07/27/23 1245  vancomycin (VANCOCIN) IVPB 1000 mg/200 mL premix        1,000 mg 200 mL/hr over 60 Minutes Intravenous  Once 07/27/23 1240 07/27/23 1414        Medications  Scheduled Meds:  apixaban  5 mg Oral BID   arformoterol  15 mcg Nebulization BID   budesonide (PULMICORT) nebulizer solution  0.5 mg Nebulization BID   buPROPion ER  100 mg Oral Daily   Chlorhexidine Gluconate Cloth  6 each Topical Daily   colchicine  0.6 mg Oral Daily   insulin aspart  0-20 Units Subcutaneous TID WC   insulin aspart  0-5 Units Subcutaneous QHS   insulin aspart  5 Units Subcutaneous TID WC   insulin glargine-yfgn  38 Units Subcutaneous Daily   levothyroxine  50 mcg Oral QAC breakfast   metoprolol tartrate  12.5 mg Oral BID   nystatin cream   Topical BID   predniSONE  40 mg Oral Daily   revefenacin  175 mcg Nebulization Daily   rosuvastatin  40 mg Oral Daily   torsemide  50 mg Oral BID    Continuous Infusions:   PRN Meds:.acetaminophen, albuterol, docusate sodium, ondansetron (ZOFRAN) IV, mouth rinse, oxyCODONE, polyethylene glycol, zolpidem    Subjective:   [name] was seen and examined today. Worsening wrist pain, and swelling.   Objective:   Vitals:   08/03/23 2059 08/04/23 0642 08/04/23 0727 08/04/23 0752  BP:  (!) 154/62 (!) 125/51   Pulse:  (!) 55 63   Resp:  20 16   Temp:  97.9 F (36.6 C) 98.7 F (37.1 C)   TempSrc:   Oral   SpO2: 98% 95% 95% 96%  Weight:  Height:        Intake/Output Summary (Last 24 hours) at 08/04/2023 1037 Last data filed at 08/03/2023 1900 Gross per 24 hour  Intake --  Output 400 ml  Net -400 ml   Filed Weights   07/30/23 0500 07/31/23 0500 08/03/23 0351  Weight: 101.3 kg 103.6 kg 107.2 kg     Exam General exam: Appears calm and comfortable  Respiratory system: Clear to auscultation. Respiratory effort normal. Cardiovascular system: S1 & S2 heard, RRR.  Gastrointestinal system: Abdomen is nondistended, soft and nontender.  Central nervous system: Alert and oriented. No focal neurological deficits. Extremities: RLE swelling and redness , right wrist pain and tenderness, with swelling.  Skin: No rashes, Psychiatry:  Mood & affect appropriate.      Data Reviewed:  I have personally reviewed following labs and imaging studies   CBC Lab Results  Component Value Date   WBC 13.5 (H) 08/03/2023   RBC 2.74 (L) 08/03/2023   HGB 7.9 (L) 08/03/2023   HCT 25.6 (L) 08/03/2023   MCV 93.4 08/03/2023   MCH 28.8 08/03/2023   PLT 325 08/03/2023   MCHC 30.9 08/03/2023   RDW 14.7 08/03/2023   LYMPHSABS 2.0 08/03/2023   MONOABS 0.7 08/03/2023   EOSABS 0.4 08/03/2023   BASOSABS 0.0 08/03/2023     Last metabolic panel Lab Results  Component Value Date   NA 137 08/02/2023   K 3.8 08/02/2023   CL 105 08/02/2023   CO2 22 08/02/2023   BUN 51 (H) 08/02/2023   CREATININE 1.44 (H) 08/02/2023   GLUCOSE 156 (H)  08/02/2023   GFRNONAA 42 (L) 08/02/2023   GFRAA 39 (L) 06/08/2020   CALCIUM 8.5 (L) 08/02/2023   PHOS 2.7 04/18/2022   PROT 7.0 07/27/2023   ALBUMIN 2.9 (L) 07/27/2023   LABGLOB 2.7 07/07/2022   AGRATIO 1.5 07/07/2022   BILITOT 0.3 07/27/2023   ALKPHOS 154 (H) 07/27/2023   AST 26 07/27/2023   ALT 27 07/27/2023   ANIONGAP 10 08/02/2023    CBG (last 3)  Recent Labs    08/03/23 2032 08/04/23 0432 08/04/23 0650  GLUCAP 166* 173* 143*      Coagulation Profile: No results for input(s): "INR", "PROTIME" in the last 168 hours.   Radiology Studies: No results found.     Kathlen Mody M.D. [facility] 08/04/2023, 10:37 AM  Available via Epic secure chat 7am-7pm After 7 pm, please refer to night coverage provider listed on amion.

## 2023-08-05 DIAGNOSIS — R6520 Severe sepsis without septic shock: Secondary | ICD-10-CM | POA: Diagnosis not present

## 2023-08-05 DIAGNOSIS — N390 Urinary tract infection, site not specified: Secondary | ICD-10-CM | POA: Diagnosis not present

## 2023-08-05 DIAGNOSIS — N179 Acute kidney failure, unspecified: Secondary | ICD-10-CM | POA: Diagnosis not present

## 2023-08-05 DIAGNOSIS — A419 Sepsis, unspecified organism: Secondary | ICD-10-CM | POA: Diagnosis not present

## 2023-08-05 LAB — GLUCOSE, CAPILLARY
Glucose-Capillary: 230 mg/dL — ABNORMAL HIGH (ref 70–99)
Glucose-Capillary: 306 mg/dL — ABNORMAL HIGH (ref 70–99)

## 2023-08-05 MED ORDER — POLYETHYLENE GLYCOL 3350 17 G PO PACK: 17.0000 g | PACK | Freq: Every day | ORAL | 0 refills | Status: AC | PRN

## 2023-08-05 MED ORDER — PREDNISONE 20 MG PO TABS: 40.0000 mg | ORAL_TABLET | Freq: Every day | ORAL | 0 refills | Status: AC

## 2023-08-05 MED ORDER — METOPROLOL TARTRATE 25 MG PO TABS: 12.5000 mg | ORAL_TABLET | Freq: Two times a day (BID) | ORAL | 1 refills | Status: DC

## 2023-08-05 MED ORDER — NYSTATIN 100000 UNIT/GM EX CREA: TOPICAL_CREAM | Freq: Two times a day (BID) | CUTANEOUS | 0 refills | Status: DC

## 2023-08-05 MED ORDER — OXYCODONE HCL 5 MG PO TABS: 5.0000 mg | ORAL_TABLET | ORAL | 0 refills | Status: AC | PRN

## 2023-08-05 NOTE — Plan of Care (Signed)
  Problem: Fluid Volume: Goal: Ability to maintain a balanced intake and output will improve Outcome: Progressing   Problem: Nutritional: Goal: Maintenance of adequate nutrition will improve Outcome: Progressing   Problem: Skin Integrity: Goal: Risk for impaired skin integrity will decrease Outcome: Progressing   Problem: Tissue Perfusion: Goal: Adequacy of tissue perfusion will improve Outcome: Progressing   Problem: Clinical Measurements: Goal: Ability to maintain clinical measurements within normal limits will improve Outcome: Progressing Goal: Will remain free from infection Outcome: Progressing Goal: Diagnostic test results will improve Outcome: Progressing Goal: Respiratory complications will improve Outcome: Progressing Goal: Cardiovascular complication will be avoided Outcome: Progressing   Problem: Activity: Goal: Risk for activity intolerance will decrease Outcome: Progressing   Problem: Nutrition: Goal: Adequate nutrition will be maintained Outcome: Progressing   Problem: Elimination: Goal: Will not experience complications related to bowel motility Outcome: Progressing Goal: Will not experience complications related to urinary retention Outcome: Progressing   Problem: Pain Managment: Goal: General experience of comfort will improve Outcome: Progressing   Problem: Safety: Goal: Ability to remain free from injury will improve Outcome: Progressing   Problem: Skin Integrity: Goal: Risk for impaired skin integrity will decrease Outcome: Progressing

## 2023-08-05 NOTE — Discharge Summary (Signed)
Physician Discharge Summary   Patient: [name] MRN: [id number] DOB: [birth year]  Admit date:     07/27/2023  Discharge date: {dischdate:26783}  Discharge Physician: Kathlen Mody   PCP: [name], MD   Recommendations at discharge:  {Tip this will not be part of the note when signed- Example include specific recommendations for outpatient follow-up, pending tests to follow-up on. (Optional):26781}  ***  Discharge Diagnoses: Principal Problem:   Severe sepsis with acute organ dysfunction ([name]) Active Problems:   Urinary tract infection without hematuria   Encephalopathy acute   Acute renal failure superimposed on stage 3 chronic kidney disease ([name])   Severe sepsis ([name])  Resolved Problems:   * No resolved hospital problems. [facility] Course:   Assessment and Plan: No notes have been filed under this hospital service. Service: Hospitalist     {Tip this will not be part of the note when signed Body mass index is 40.57 kg/m. , ,  Active Pressure Injury/Wound(s)     Pressure Ulcer  Duration          Pressure Injury 05/04/21 Other (Comment) Right Stage 3 -  Full thickness tissue loss. Subcutaneous fat may be visible but bone, tendon or muscle are NOT exposed. 0.5x0.5, 1 inch depth 823 days           (Optional):26781}  {(NOTE) Pain control PDMP Statment (Optional):26782} Consultants: *** Procedures performed: ***  Disposition: {Plan; Disposition:26390} Diet recommendation:  Discharge Diet Orders (From admission, onward)     Start     Ordered   08/05/23 0000  Diet - low sodium heart healthy        08/05/23 1743           {Diet_Plan:26776} DISCHARGE MEDICATION: Allergies as of 08/05/2023       Reactions   Gabapentin Nausea And Vomiting, Other (See Comments)   POSSIBLE SHAKING   Lyrica [pregabalin] Other (See Comments)   Shaking        Medication List     STOP taking these medications    hydrALAZINE 50 MG tablet Commonly known  as: APRESOLINE       TAKE these medications    Accu-Chek Guide test strip Generic drug: glucose blood USE TO CHECK BLOOD SUGAR FOUR TIMES DAILY   acetaminophen 500 MG tablet Commonly known as: TYLENOL Take 1,000 mg by mouth every 6 (six) hours as needed for headache (pain).   albuterol 108 (90 Base) MCG/ACT inhaler Commonly known as: VENTOLIN HFA USE 2 PUFFS BY MOUTH EVERY FOUR HOURS, AS NEEDED, FOR COUGHING/WHEEZING   apixaban 5 MG Tabs tablet Commonly known as: Eliquis Take 1 tablet (5 mg total) by mouth 2 (two) times daily.   budesonide-formoterol 160-4.5 MCG/ACT inhaler Commonly known as: Symbicort Inhale 2 puffs into the lungs 2 (two) times daily.   buPROPion ER 100 MG 12 hr tablet Commonly known as: Wellbutrin SR Take 1 tablet (100 mg total) by mouth daily.   colchicine 0.6 MG tablet Take 1.2 mg by mouth See admin instructions. Take 1.2 mg for flair up and an hour later take 0.6 mg if needed   Easy Comfort Pen Needles 31G X 5 MM Misc Generic drug: Insulin Pen Needle USE FOUR TIMES A DAY FOR INSULIN ADMINISTRATION   FISH OIL PO Take 1 capsule by mouth every morning.   insulin lispro 100 UNIT/ML KwikPen Commonly known as: HUMALOG Inject 10 Units into the skin 3 (three) times daily with meals.   Jardiance 10 MG Tabs tablet  Generic drug: empagliflozin TAKE 1 TABLET (10 MG TOTAL) BY MOUTH DAILY BEFORE BREAKFAST (AM)   Lantus SoloStar 100 UNIT/ML Solostar Pen Generic drug: insulin glargine Inject 37 Units into the skin daily. What changed: how much to take   levothyroxine 50 MCG tablet Commonly known as: SYNTHROID TAKE 1 TABLET (50 MCG TOTAL) BY MOUTH DAILY BEFORE BREAKFAST (AM)   losartan 25 MG tablet Commonly known as: COZAAR TAKE 2 TABLETS (50 MG TOTAL) BY MOUTH DAILY (AM)   metoprolol tartrate 25 MG tablet Commonly known as: LOPRESSOR Take 0.5 tablets (12.5 mg total) by mouth 2 (two) times daily.   nystatin cream Commonly known as:  MYCOSTATIN Apply topically 2 (two) times daily.   oxyCODONE 5 MG immediate release tablet Commonly known as: Oxy IR/ROXICODONE Take 1 tablet (5 mg total) by mouth every 4 (four) hours as needed for up to 5 days for moderate pain.   pantoprazole 40 MG tablet Commonly known as: PROTONIX TAKE 1 TABLET (40 MG TOTAL) BY MOUTH DAILY(AM)   polyethylene glycol 17 g packet Commonly known as: MIRALAX / GLYCOLAX Take 17 g by mouth daily as needed for moderate constipation.   potassium chloride SA 20 MEQ tablet Commonly known as: KLOR-CON M Take 1 tablet (20 mEq total) by mouth 2 (two) times daily.   predniSONE 20 MG tablet Commonly known as: DELTASONE Take 2 tablets (40 mg total) by mouth daily for 5 days. Start taking on: August 06, 2023   rosuvastatin 40 MG tablet Commonly known as: CRESTOR Take 1 tablet (40 mg total) by mouth daily.   spironolactone 25 MG tablet Commonly known as: ALDACTONE TAKE 0.5 TABLET (12.5 MG TOTAL) BY MOUTH DAILY (AM)   torsemide 100 MG tablet Commonly known as: DEMADEX TAKE 1 TABLET (100 MG TOTAL) BY MOUTH 2 (TWO) TIMES DAILY. (AM+BEDTIME)   triamcinolone ointment 0.1 % Commonly known as: KENALOG MIX 1:1 WITH CERA-VE (OTC) AND APPLY TOPICALLY 2 (TWO) TIMES DAILY AS NEEDED.   VITAMIN D3 PO Take 1 capsule by mouth every morning.   VITAMIN E PO Take 1 tablet by mouth daily.        Follow-up Information     [facility] Follow up.   Why: [facility] will contact you within 48 hours of discharge home to arrange a home visit Contact information: [address] [place] [address] [phone]         [facility] Follow up.   Why: [facility] has provided your wheelchair and wheelchair cushion Contact information: [address]  [place]  [address]   [phone]               Discharge Exam: Ceasar Mons Weights   07/30/23 0500 07/31/23 0500 08/03/23 0351  Weight: 101.3  kg 103.6 kg 107.2 kg   ***  Condition at discharge: {DC Condition:26389}  The results of significant diagnostics from this hospitalization (including imaging, microbiology, ancillary and laboratory) are listed below for reference.   Imaging Studies: DG Wrist Complete Right  Result Date: 08/04/2023 CLINICAL DATA:  Right wrist pain and swelling for several days. No known injury. EXAM: RIGHT WRIST - COMPLETE 3+ VIEW COMPARISON:  Hand radiograph 02/09/2021 FINDINGS: No acute fracture. There is radiocarpal joint space narrowing. Sclerotic changes in the scaphoid. Widening of the scapholunate interval at 5 mm. Degenerative carpal bone cysts. No erosive change. Dorsal soft tissue edema there prominent vascular calcifications. IMPRESSION: 1. Widening of the scapholunate interval consistent with scapholunate ligament injury  or deficiency. 2. Radiocarpal osteoarthritis. Sclerotic changes in the scaphoid favored to be degenerative. Degenerative carpal bone cysts. Electronically Signed   By: Narda Rutherford M.D.   On: 08/04/2023 14:15   VAS [place] LOWER EXTREMITY VENOUS (DVT)  Result Date: 07/28/2023  Lower Venous DVT Study Patient Name:  [name]  Date of Exam:   07/28/2023 Medical Rec #: [id number]      Accession #:    [id number] Date of Birth: 1961/06/14     Patient Gender: F Patient Age:   45 years Exam Location:  [facility] Procedure:      VAS [place] LOWER EXTREMITY VENOUS (DVT) Referring Phys: Heath Lark --------------------------------------------------------------------------------  Indications: Pain, and Swelling.  Limitations: Poor ultrasound/tissue interface and body habitus. Comparison Study: Previous exam on 04/21/22 was negative for DVT Performing Technologist: Ernestene Mention RVT, RDMS  Examination Guidelines: A complete evaluation includes B-mode imaging, spectral Doppler, color Doppler, and power Doppler as needed of all accessible portions of each vessel. Bilateral testing is considered an  integral part of a complete examination. Limited examinations for reoccurring indications may be performed as noted. The reflux portion of the exam is performed with the patient in reverse Trendelenburg.  +--------+---------------+---------+-----------+----------+--------------------+ RIGHT   CompressibilityPhasicitySpontaneityPropertiesThrombus Aging       +--------+---------------+---------+-----------+----------+--------------------+ CFV     Full           Yes      Yes                                       +--------+---------------+---------+-----------+----------+--------------------+ SFJ     Full                                                              +--------+---------------+---------+-----------+----------+--------------------+ FV Prox Full           Yes      Yes                                       +--------+---------------+---------+-----------+----------+--------------------+ FV Mid  Full           Yes      Yes                                       +--------+---------------+---------+-----------+----------+--------------------+ FV      Full           Yes      Yes                                       Distal                                                                    +--------+---------------+---------+-----------+----------+--------------------+  PFV                    Yes      Yes                  patent by                                                                 color/doppler        +--------+---------------+---------+-----------+----------+--------------------+ POP     Full           Yes      Yes                                       +--------+---------------+---------+-----------+----------+--------------------+ PTV     Full                                                              +--------+---------------+---------+-----------+----------+--------------------+ PERO    Full                                          Not well visualized  +--------+---------------+---------+-----------+----------+--------------------+   +----+---------------+---------+-----------+----------+--------------+ LEFTCompressibilityPhasicitySpontaneityPropertiesThrombus Aging +----+---------------+---------+-----------+----------+--------------+ CFV Full           Yes      Yes                                 +----+---------------+---------+-----------+----------+--------------+    Summary: RIGHT: - There is no evidence of deep vein thrombosis in the lower extremity.  - No cystic structure found in the popliteal fossa. subcutaneous edema in area of calf  LEFT: - No evidence of common femoral vein obstruction.   *See table(s) above for measurements and observations. Electronically signed by Sherald Hess MD on 07/28/2023 at 4:03:10 PM.    Final    DG Chest Portable 1 View  Result Date: 07/27/2023 CLINICAL DATA:  Hyperglycemia.  Congestive heart failure. EXAM: PORTABLE CHEST 1 VIEW COMPARISON:  None Available. FINDINGS: The heart size and mediastinal contours are within normal limits. Hypoinflation of the lungs. Both lungs are clear. The visualized skeletal structures are unremarkable. IMPRESSION: No active disease.  Hypoinflation of the lungs. Electronically Signed   By: Lupita Raider M.D.   On: 07/27/2023 12:05    Microbiology: Results for orders placed or performed during the hospital encounter of 07/27/23  Blood culture (routine x 2)     Status: None   Collection Time: 07/27/23 12:43 PM   Specimen: BLOOD  Result Value Ref Range Status   Specimen Description BLOOD SITE NOT SPECIFIED  Final   Special Requests   Final    BOTTLES DRAWN AEROBIC AND ANAEROBIC Blood Culture results may not be optimal due to an inadequate volume of blood received in culture bottles   Culture   Final  NO GROWTH 5 DAYS Performed at [facility], [address] [place], [place] [address]    Report Status 08/01/2023  FINAL  Final  Blood culture (routine x 2)     Status: None   Collection Time: 07/27/23 12:48 PM   Specimen: BLOOD  Result Value Ref Range Status   Specimen Description BLOOD SITE NOT SPECIFIED  Final   Special Requests   Final    BOTTLES DRAWN AEROBIC AND ANAEROBIC Blood Culture results may not be optimal due to an inadequate volume of blood received in culture bottles   Culture   Final    NO GROWTH 5 DAYS Performed at [facility], [address], [place], [place] [address]    Report Status 08/01/2023 FINAL  Final  MRSA Next Gen by PCR, Nasal     Status: None   Collection Time: 07/27/23  6:05 PM   Specimen: Nasal Mucosa; Nasal Swab  Result Value Ref Range Status   MRSA by PCR Next Gen NOT DETECTED NOT DETECTED Final    Comment: (NOTE) The GeneXpert MRSA Assay (FDA approved for NASAL specimens only), is one component of a comprehensive MRSA colonization surveillance program. It is not intended to diagnose MRSA infection nor to guide or monitor treatment for MRSA infections. Test performance is not FDA approved in patients less than 82 years old. Performed at [facility], [address], [place], [place] [address]    *Note: Due to a large number of results and/or encounters for the requested time period, some results have not been displayed. A complete set of results can be found in Results Review.    Labs: CBC: Recent Labs  Lab 07/30/23 0643 07/31/23 0717 08/01/23 0820 08/02/23 0945 08/03/23 1014  WBC 10.3 11.8* 11.6* 14.0* 13.5*  NEUTROABS  --   --   --   --  9.8*  HGB 8.0* 8.5* 8.1* 8.2* 7.9*  HCT 26.0* 28.3* 26.3* 26.7* 25.6*  MCV 92.5 91.3 94.3 92.7 93.4  PLT 199 210 245 291 325   Basic Metabolic Panel: Recent Labs  Lab 07/30/23 0643 07/31/23 0717 08/01/23 0820 08/02/23 0945  NA 134* 137 135 137  K 3.6 3.7 3.8 3.8  CL 108 107 104 105  CO2 19* 20* 18* 22  GLUCOSE 171* 141* 321* 156*  BUN 45* 43* 50* 51*  CREATININE 1.42* 1.46* 1.73* 1.44*   CALCIUM 7.6* 8.0* 8.1* 8.5*  MG 2.0  --   --  2.1   Liver Function Tests: No results for input(s): "AST", "ALT", "ALKPHOS", "BILITOT", "PROT", "ALBUMIN" in the last 168 hours. CBG: Recent Labs  Lab 08/04/23 1135 08/04/23 1534 08/04/23 2044 08/05/23 0744 08/05/23 1416  GLUCAP 258* 260* 118* 230* 306*    Discharge time spent: {LESS THAN/GREATER THAN:26388} 30 minutes.  Signed: Kathlen Mody, MD [facility] 08/05/2023

## 2023-08-05 NOTE — Progress Notes (Addendum)
Patient discharged.  Tech removed PIVs. Reviewed discharge instructions, medications and follow up appts with patient.  Answered questions.  Gave patient copy of discharge instructions.    No additional questions or concerns at this time.  Patient returned to hospital.  She forgot to take her wheelchair.  Tech took wheelchair down to patient.

## 2023-08-07 ENCOUNTER — Other Ambulatory Visit (HOSPITAL_COMMUNITY): Payer: Self-pay

## 2023-08-07 ENCOUNTER — Telehealth: Payer: Self-pay

## 2023-08-07 ENCOUNTER — Telehealth (HOSPITAL_COMMUNITY): Payer: Self-pay
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
## 2023-08-07 NOTE — Transitions of Care (Post Inpatient/ED Visit) (Signed)
08/07/2023  Name: [name] MRN: [id number] DOB: [birth year]  Today's TOC FU Call Status: Today's TOC FU Call Status:: Unsuccessful Call (1st Attempt) Unsuccessful Call (1st Attempt) Date: 08/07/23  Attempted to reach the patient regarding the most recent Inpatient/ED visit.  Follow Up Plan: Additional outreach attempts will be made to reach the patient to complete the Transitions of Care (Post Inpatient/ED visit) call.   Signature Robyne Peers, RN

## 2023-08-07 NOTE — Telephone Encounter (Signed)
Spoke to [place] at [employer] and she reports they did receive Ms. Haleys referral and they will be reaching out to set up home visits this week.   Call complete.   [employer] [place] Office [phone]

## 2023-08-08 ENCOUNTER — Other Ambulatory Visit: Payer: Self-pay | Admitting: Family Medicine

## 2023-08-08 ENCOUNTER — Telehealth (HOSPITAL_BASED_OUTPATIENT_CLINIC_OR_DEPARTMENT_OTHER): Payer: Self-pay | Admitting: Licensed Clinical Social Worker

## 2023-08-08 ENCOUNTER — Telehealth: Payer: Self-pay
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
## 2023-08-08 NOTE — Telephone Encounter (Signed)
Medication Refill - Medication: triamcinolone ointment (KENALOG) 0.1 %   Has the patient contacted their pharmacy? Yes.    Preferred Pharmacy (with phone number or street name): [facility] [place] [address] Phone: [phone]  Fax: [phone]   Has the patient been seen for an appointment in the last year OR does the patient have an upcoming appointment? Yes.    Agent: Please be advised that RX refills may take up to 3 business days. We ask that you follow-up with your pharmacy.

## 2023-08-08 NOTE — Telephone Encounter (Signed)
H&V Care Navigation CSW Progress Note  Clinical Social Worker  received referral from paramedicine program  to discuss assistance with [employer] as pt received notice that she is going to have to pay her premiums. LCSW sent information about [employer] [facility]) counselors to paramedic team. LCSW able to also call/mail pt information as needed- remain available.    Patient is participating in a Managed Medicaid Plan:  No, UHC Medicare only  SDOH Screenings   Food Insecurity: No Food Insecurity (02/09/2023)  Housing: Low Risk  (02/09/2023)  Transportation Needs: No Transportation Needs (02/09/2023)  Utilities: Not At Risk (02/09/2023)  Alcohol Screen: Low Risk  (01/28/2020)  Depression (PHQ2-9): Low Risk  (02/09/2023)  Financial Resource Strain: Low Risk  (02/09/2023)  Physical Activity: Inactive (02/09/2023)  Stress: No Stress Concern Present (02/09/2023)  Tobacco Use: Medium Risk (07/11/2023)    Octavio Graves, MSW, LCSW Clinical Social Worker II [facility] Heart/Vascular Care Navigation  [phone]- work cell phone (preferred) [phone]- desk phone

## 2023-08-08 NOTE — Telephone Encounter (Signed)
[place] called back again to see if office received request? Please advise   Best contact: [phone]

## 2023-08-09 ENCOUNTER — Encounter (HOSPITAL_BASED_OUTPATIENT_CLINIC_OR_DEPARTMENT_OTHER): Payer: 59 | Admitting: Physician Assistant

## 2023-08-09 DIAGNOSIS — I87331 Chronic venous hypertension (idiopathic) with ulcer and inflammation of right lower extremity: Secondary | ICD-10-CM | POA: Diagnosis not present

## 2023-08-09 DIAGNOSIS — F17210 Nicotine dependence, cigarettes, uncomplicated: Secondary | ICD-10-CM | POA: Diagnosis not present

## 2023-08-09 DIAGNOSIS — I130 Hypertensive heart and chronic kidney disease with heart failure and stage 1 through stage 4 chronic kidney disease, or unspecified chronic kidney disease: Secondary | ICD-10-CM | POA: Diagnosis not present

## 2023-08-09 DIAGNOSIS — Z89431 Acquired absence of right foot: Secondary | ICD-10-CM | POA: Diagnosis not present

## 2023-08-09 DIAGNOSIS — J449 Chronic obstructive pulmonary disease, unspecified: Secondary | ICD-10-CM | POA: Diagnosis not present

## 2023-08-09 DIAGNOSIS — N1832 Chronic kidney disease, stage 3b: Secondary | ICD-10-CM | POA: Diagnosis not present

## 2023-08-09 DIAGNOSIS — L97512 Non-pressure chronic ulcer of other part of right foot with fat layer exposed: Secondary | ICD-10-CM | POA: Diagnosis not present

## 2023-08-09 DIAGNOSIS — E11622 Type 2 diabetes mellitus with other skin ulcer: Secondary | ICD-10-CM | POA: Diagnosis not present

## 2023-08-09 DIAGNOSIS — L97522 Non-pressure chronic ulcer of other part of left foot with fat layer exposed: Secondary | ICD-10-CM | POA: Diagnosis not present

## 2023-08-09 DIAGNOSIS — L97812 Non-pressure chronic ulcer of other part of right lower leg with fat layer exposed: Secondary | ICD-10-CM | POA: Diagnosis not present

## 2023-08-09 DIAGNOSIS — E1122 Type 2 diabetes mellitus with diabetic chronic kidney disease: Secondary | ICD-10-CM | POA: Diagnosis not present

## 2023-08-09 DIAGNOSIS — Z89432 Acquired absence of left foot: Secondary | ICD-10-CM | POA: Diagnosis not present

## 2023-08-09 DIAGNOSIS — E11621 Type 2 diabetes mellitus with foot ulcer: Secondary | ICD-10-CM | POA: Diagnosis present

## 2023-08-09 MED ORDER — TRIAMCINOLONE ACETONIDE 0.1 % EX OINT: TOPICAL_OINTMENT | Freq: Two times a day (BID) | CUTANEOUS | 0 refills | Status: DC

## 2023-08-09 NOTE — Telephone Encounter (Signed)
Requested medication (s) are due for refill today - provider review   Requested medication (s) are on the active medication list -yes  Future visit scheduled -no  Last refill: 04/13/23 454g  Notes to clinic: non delegated Rx  Requested Prescriptions  Pending Prescriptions Disp Refills   triamcinolone ointment (KENALOG) 0.1 % 454 g 0     Not Delegated - Dermatology:  Corticosteroids Failed - 08/08/2023  2:15 PM      Failed - This refill cannot be delegated      Passed - Valid encounter within last 12 months    Recent Outpatient Visits           10 months ago Pre-ulcerative calluses   [place] [facility] Nixon, Flas[place], MD   11 months ago DM (diabetes mellitus), type 2 with peripheral vascular complications [facility])   [place] [facility] [place], [place] L, RPH-CPP   1 year ago DM (diabetes mellitus), type 2 with peripheral vascular complications [facility])   [place] [facility] [place], [place], [place]   1 year ago DM (diabetes mellitus), type 2 with peripheral vascular complications [facility])   [facility] [place], Odette Horns, MD   1 year ago DM (diabetes mellitus), type 2 with peripheral vascular complications [facility])   [facility] [place], Marylene Land M, [place]                 Requested Prescriptions  Pending Prescriptions Disp Refills   triamcinolone ointment (KENALOG) 0.1 % 454 g 0     Not Delegated - Dermatology:  Corticosteroids Failed - 08/08/2023  2:15 PM      Failed - This refill cannot be delegated      Passed - Valid encounter within last 12 months    Recent Outpatient Visits           10 months ago Pre-ulcerative calluses   [place] [facility] Lealman, Sandy, MD   11 months ago DM (diabetes mellitus), type 2 with peripheral vascular complications [facility])   [place] [facility] [place], [place] L, RPH-CPP   1 year ago DM (diabetes mellitus), type 2 with peripheral vascular complications [facility])   [place] [facility] [place], [place], [place]   1 year ago DM (diabetes mellitus), type 2 with peripheral vascular complications [facility])   [place] [facility] [place], Odette Horns, MD   1 year ago DM (diabetes mellitus), type 2 with peripheral vascular complications [facility])   [facility] [place], [place], [place]

## 2023-08-09 NOTE — Telephone Encounter (Signed)
Form has been received.

## 2023-08-09 NOTE — Progress Notes (Signed)
YESINA, LUCIA ([id number]) [id number] Page 1 of 12 Visit Report for 08/09/2023 Chief Complaint Document Details Patient Name: Date of Service: Maryla Morrow 08/09/2023 2:00 PM Medical Record Number: [id number] Patient Account Number: [id number] Date of Birth/Sex: Treating RN: 07/01/1961 (62 y.o. F) Primary Care Provider: PCP, NO Other Clinician: Referring Provider: Treating Provider/Extender: [name], [name] Weeks in Treatment: 16 Information Obtained from: Patient Chief Complaint Bilateral LE Ulcers Electronic Signature(s) Signed: 08/09/2023 2:12:58 PM By: Allen Derry PA-C Entered By: Allen Derry on 08/09/2023 14:12:58 -------------------------------------------------------------------------------- Debridement Details Patient Name: Date of Service: HA LEY, [name] 08/09/2023 2:00 PM Medical Record Number: [id number] Patient Account Number: [id number] Date of Birth/Sex: Treating RN: 1960-12-21 (61 y.o. [name], Millard.Loa Primary Care Provider: PCP, NO Other Clinician: Referring Provider: Treating Provider/Extender: Juleen China Weeks in Treatment: 16 Debridement Performed for Assessment: Wound #23 Right Metatarsal head first Performed By: Physician Lenda Kelp, PA The following information was scribed by: Shawn Stall The information was scribed for: Lenda Kelp Debridement Type: Debridement Severity of Tissue Pre Debridement: Fat layer exposed Level of Consciousness (Pre-procedure): Awake and Alert Pre-procedure Verification/Time Out Yes - 14:35 Taken: Start Time: 14:36 Pain Control: Lidocaine 4% T opical Solution Percent of Wound Bed Debrided: 10% T Area Debrided (cm): otal 0.03 Tissue and other material debrided: Viable, Non-Viable, Callus, Slough, Subcutaneous, Skin: Dermis , Skin: Epidermis, Slough Level: Skin/Subcutaneous Tissue Debridement Description: Excisional Instrument:  Curette Bleeding: Minimum Hemostasis Achieved: Pressure End Time: 14:39 Procedural Pain: 0 Post Procedural Pain: 0 Response to Treatment: Procedure was tolerated well Level of Consciousness (Post- Awake and Alert procedure): Post Debridement Measurements of Total Wound Length: (cm) 0.7 Width: (cm) 0.6 Depth: (cm) 0.3 Delmore, Gurleen S ([id number]) [id number] Page 2 of 12 Volume: (cm) 0.099 Character of Wound/Ulcer Post Debridement: Improved Severity of Tissue Post Debridement: Fat layer exposed Post Procedure Diagnosis Same as Pre-procedure Electronic Signature(s) Unsigned Entered By: Shawn Stall on 08/09/2023 14:39:48 -------------------------------------------------------------------------------- HPI Details Patient Name: Date of Service: [name]. 08/09/2023 2:00 PM Medical Record Number: [id number] Patient Account Number: [id number] Date of Birth/Sex: Treating RN: 1960-11-16 (62 y.o. F) Primary Care Provider: PCP, NO Other Clinician: Referring Provider: Treating Provider/Extender: [name], [name] Weeks in Treatment: 16 History of Present Illness HPI Description: This 62 year old patient who has a very long significant history of diabetes mellitus, previous alcohol and nicotine abuse, chronic data disease, COPD, diabetes mellitus, height hypertension, critical lower limb ischemia with several wounds being managed at the wound Center at [facility] for over a year. She was recently in the ER at [facility] and was referred to our center. He has had a long history of critical limb ischemia and over a period of time has had balloon angioplasties in March 2016, endarterectomy of the left carotid, lower extremity angiogram and treatment by Dr. Nanetta Batty, several cardiac catheterizations. Most recently she had an x-ray of her left foot while in the ER which showed no acute abnormality. during this ER visit she was  started on ciprofloxacin and asked to continue with the wound care physicians at [facility]. Her last ABI done in June 2017 showed the right side to be 0.28 in the left side to be 0.48. Her right toe brachial index was 0.17 on the right and 0.27 on the left. her last hemoglobin A1c was 11.1% She continues to smoke about a pack of cigarettes a day. 03/28/2017 -- -- right foot  Carolynn Comment ([id number]) [id number] Page 7 of 12 [phone] Non-pressure chronic ulcer of other part of right foot with fat layer exposed 04/19/2023 No Yes L97.522 Non-pressure chronic ulcer of other part of left foot with fat layer exposed 04/19/2023 No Yes E11.622 Type 2 diabetes mellitus with other skin ulcer 04/19/2023 No Yes L97.812 Non-pressure chronic ulcer of other part of right lower leg with fat layer 04/19/2023 No Yes exposed I73.89 Other  specified peripheral vascular diseases 04/19/2023 No Yes I87.331 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 08/09/2023 2:00 PM Medical Record Number: [id number] Patient Account Number: [id number] Date of Birth/Sex: Treating RN: August 27, 1961 (62 y.o. F) Primary Care Provider: PCP, NO Other Clinician: Referring Provider: Treating Provider/Extender: [name], [name] Weeks in Treatment: 16 Subjective Chief Complaint Information obtained from Patient Bilateral LE Ulcers History of Present Illness (HPI) This 62 year old patient who has a very long significant history of diabetes mellitus, previous alcohol and nicotine abuse, chronic data disease, COPD, diabetes mellitus, height hypertension, critical lower limb ischemia with several wounds being managed at the wound Center at [facility] for over a year. She was recently in the ER at [facility] and was referred to our center. He has had a long history of critical limb ischemia and over a period of time has had balloon angioplasties in March 2016, endarterectomy of the left carotid, lower extremity angiogram and treatment by Dr. Nanetta Batty, several cardiac catheterizations. Most recently she had an x-ray of her left foot while in the ER which showed no acute abnormality. during this ER visit she was started on ciprofloxacin and asked to continue with the wound care physicians at [facility]. Her last ABI done in June 2017 showed the right side to be 0.28 in the left side to be 0.48. Her right toe brachial index was 0.17 on the right and 0.27 on  the left. her last hemoglobin A1c was 11.1% She continues to smoke about a pack of cigarettes a day. 03/28/2017 -- -- right foot x-ray -- IMPRESSION:Areas of soft tissue swelling. Mild subluxation second PIP joint. No frank dislocation or fracture. No erosive change or bony destruction. No soft tissue ulceration or radiopaque foreign body evident by radiography. There is plantar fascia calcification with a nearby inferior calcaneal spur. There are foci of arterial vascular calcification. 04/04/2017 -- the patient continues to be noncompliant and continues to complain of a lot of pain and has not done anything about quitting smoking THAILYN, SHEILS ([id number]) [id number] Page 8 of 12 Readmission: 06/26/18 on evaluation today patient presents for reevaluation she has not been seen in our office since May 2018. Since she was last seen here in our office she has undergone testing at Dr. Durenda Age office where it was revealed that she had an ABI of 0.68 with her previous ABI being 0.64 and a left ABI of 0.38 with previous ABI being 0.34 this study which was performed on 04/05/18 was pretty much equivalent to the study performed on 11/22/17. The findings in the end revealed on the right would appear to be moderate right lower extremity arterial disease on the left Dr. Allyson Sabal states moderate in the report but unfortunately this appears to be much more severe at 0.38 compared to  2:00 PM Medical Record Number: [id number] Patient Account Number: [id number] Date of Birth/Sex: Treating RN: [birth year] (62 y.o. [name], Millard.Loa Primary Care Provider: PCP, NO Other Clinician: Referring Provider: Treating Provider/Extender: Juleen China Weeks in Treatment: 16 The following information was scribed by: Shawn Stall The information was scribed for: Lenda Kelp Verbal / Phone Orders: No Diagnosis Coding ICD-10 Coding Code Description 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 L97.522 Non-pressure chronic ulcer of other part of left foot with fat layer  exposed E11.622 Type 2 diabetes mellitus with other skin ulcer L97.812 Non-pressure chronic ulcer of other part of right lower leg with fat layer exposed I73.89 Other specified peripheral vascular diseases I87.331 Chronic venous hypertension (idiopathic) with ulcer and inflammation of right lower extremity Follow-up Appointments ppointment in 1 week. Allen Derry PA Wednesday 08/09/2023 @2pm (already scheduled) Return A GATHEL, GUINAN ([id number]) [id number] Page 6 of 12 ppointment in 2 weeks. Allen Derry PA Wednesday please schedule Return A Return appointment in 3 weeks. Allen Derry PA Wednesday please schedule Anesthetic (In clinic) Topical Lidocaine 5% applied to wound bed Bathing/ Shower/ Hygiene May shower with protection but do not get wound dressing(s) wet. Protect dressing(s) with water repellant cover (for example, large plastic bag) or a cast cover and may then take shower. Edema Control - Lymphedema / SCD / Other Bilateral Lower Extremities Elevate legs to the level of the heart or above for 30 minutes daily and/or when sitting for 3-4 times a day throughout the day. Avoid standing for long periods of time. Patient to wear own compression stockings every day. Exercise regularly - As tolerated Moisturize legs daily. Other Edema Control Orders/Instructions: - Recommend Compression stocking 15-49mm/Hg. Brochure for Elastic Therapy with printed leg measurements. Wound Treatment Wound #23 - Metatarsal head first Wound Laterality: Right Cleanser: Soap and Water 3 x Per Week/30 Days Discharge Instructions: May shower and wash wound with dial antibacterial soap and water prior to dressing change. Cleanser: Wound Cleanser 3 x Per Week/30 Days Discharge Instructions: Cleanse the wound with wound cleanser prior to applying a clean dressing using gauze sponges, not tissue or cotton balls. Peri-Wound Care: Sween Lotion (Moisturizing lotion) 3 x Per Week/30  Days Discharge Instructions: Apply moisturizing lotion as directed Prim Dressing: Promogran Prisma Matrix, 4.34 (sq in) (silver collagen) 3 x Per Week/30 Days ary Discharge Instructions: Moisten collagen with saline or hydrogel Secondary Dressing: Optifoam Non-Adhesive Dressing, 4x4 in 3 x Per Week/30 Days Discharge Instructions: Apply foam donut and gauze Secured With: Kerlix Roll Sterile, 4.5x3.1 (in/yd) 3 x Per Week/30 Days Discharge Instructions: Secure with Kerlix as directed. Secured With: 6M Medipore H Soft Cloth Surgical T ape, 4 x 10 (in/yd) 3 x Per Week/30 Days Discharge Instructions: Secure with tape as directed. Compression Wrap: tubigrip size D single layer 3 x Per Week/30 Days Discharge Instructions: apply in the morning and remove at night. Electronic Signature(s) Unsigned Entered By: Shawn Stall on 08/09/2023 14:42:14 -------------------------------------------------------------------------------- Problem List Details Patient Name: Date of Service: [name]. 08/09/2023 2:00 PM Medical Record Number: [id number] Patient Account Number: [id number] Date of Birth/Sex: Treating RN: April 16, 1961 (62 y.o. F) Primary Care Provider: PCP, NO Other Clinician: Referring Provider: Treating Provider/Extender: [name], [name] Weeks in Treatment: 16 Active Problems ICD-10 Encounter Code Description Active Date MDM Diagnosis 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 2 diabetes mellitus with foot ulcer 04/19/2023 No Yes [place],  2:00 PM Medical Record Number: [id number] Patient Account Number: [id number] Date of Birth/Sex: Treating RN: [birth year] (62 y.o. [name], Millard.Loa Primary Care Provider: PCP, NO Other Clinician: Referring Provider: Treating Provider/Extender: Juleen China Weeks in Treatment: 16 The following information was scribed by: Shawn Stall The information was scribed for: Lenda Kelp Verbal / Phone Orders: No Diagnosis Coding ICD-10 Coding Code Description 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 L97.522 Non-pressure chronic ulcer of other part of left foot with fat layer  exposed E11.622 Type 2 diabetes mellitus with other skin ulcer L97.812 Non-pressure chronic ulcer of other part of right lower leg with fat layer exposed I73.89 Other specified peripheral vascular diseases I87.331 Chronic venous hypertension (idiopathic) with ulcer and inflammation of right lower extremity Follow-up Appointments ppointment in 1 week. Allen Derry PA Wednesday 08/09/2023 @2pm (already scheduled) Return A GATHEL, GUINAN ([id number]) [id number] Page 6 of 12 ppointment in 2 weeks. Allen Derry PA Wednesday please schedule Return A Return appointment in 3 weeks. Allen Derry PA Wednesday please schedule Anesthetic (In clinic) Topical Lidocaine 5% applied to wound bed Bathing/ Shower/ Hygiene May shower with protection but do not get wound dressing(s) wet. Protect dressing(s) with water repellant cover (for example, large plastic bag) or a cast cover and may then take shower. Edema Control - Lymphedema / SCD / Other Bilateral Lower Extremities Elevate legs to the level of the heart or above for 30 minutes daily and/or when sitting for 3-4 times a day throughout the day. Avoid standing for long periods of time. Patient to wear own compression stockings every day. Exercise regularly - As tolerated Moisturize legs daily. Other Edema Control Orders/Instructions: - Recommend Compression stocking 15-49mm/Hg. Brochure for Elastic Therapy with printed leg measurements. Wound Treatment Wound #23 - Metatarsal head first Wound Laterality: Right Cleanser: Soap and Water 3 x Per Week/30 Days Discharge Instructions: May shower and wash wound with dial antibacterial soap and water prior to dressing change. Cleanser: Wound Cleanser 3 x Per Week/30 Days Discharge Instructions: Cleanse the wound with wound cleanser prior to applying a clean dressing using gauze sponges, not tissue or cotton balls. Peri-Wound Care: Sween Lotion (Moisturizing lotion) 3 x Per Week/30  Days Discharge Instructions: Apply moisturizing lotion as directed Prim Dressing: Promogran Prisma Matrix, 4.34 (sq in) (silver collagen) 3 x Per Week/30 Days ary Discharge Instructions: Moisten collagen with saline or hydrogel Secondary Dressing: Optifoam Non-Adhesive Dressing, 4x4 in 3 x Per Week/30 Days Discharge Instructions: Apply foam donut and gauze Secured With: Kerlix Roll Sterile, 4.5x3.1 (in/yd) 3 x Per Week/30 Days Discharge Instructions: Secure with Kerlix as directed. Secured With: 6M Medipore H Soft Cloth Surgical T ape, 4 x 10 (in/yd) 3 x Per Week/30 Days Discharge Instructions: Secure with tape as directed. Compression Wrap: tubigrip size D single layer 3 x Per Week/30 Days Discharge Instructions: apply in the morning and remove at night. Electronic Signature(s) Unsigned Entered By: Shawn Stall on 08/09/2023 14:42:14 -------------------------------------------------------------------------------- Problem List Details Patient Name: Date of Service: [name]. 08/09/2023 2:00 PM Medical Record Number: [id number] Patient Account Number: [id number] Date of Birth/Sex: Treating RN: April 16, 1961 (62 y.o. F) Primary Care Provider: PCP, NO Other Clinician: Referring Provider: Treating Provider/Extender: [name], [name] Weeks in Treatment: 16 Active Problems ICD-10 Encounter Code Description Active Date MDM Diagnosis 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 2 diabetes mellitus with foot ulcer 04/19/2023 No Yes [place],  2:00 PM Medical Record Number: [id number] Patient Account Number: [id number] Date of Birth/Sex: Treating RN: [birth year] (62 y.o. [name], Millard.Loa Primary Care Provider: PCP, NO Other Clinician: Referring Provider: Treating Provider/Extender: Juleen China Weeks in Treatment: 16 The following information was scribed by: Shawn Stall The information was scribed for: Lenda Kelp Verbal / Phone Orders: No Diagnosis Coding ICD-10 Coding Code Description 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 L97.522 Non-pressure chronic ulcer of other part of left foot with fat layer  exposed E11.622 Type 2 diabetes mellitus with other skin ulcer L97.812 Non-pressure chronic ulcer of other part of right lower leg with fat layer exposed I73.89 Other specified peripheral vascular diseases I87.331 Chronic venous hypertension (idiopathic) with ulcer and inflammation of right lower extremity Follow-up Appointments ppointment in 1 week. Allen Derry PA Wednesday 08/09/2023 @2pm (already scheduled) Return A GATHEL, GUINAN ([id number]) [id number] Page 6 of 12 ppointment in 2 weeks. Allen Derry PA Wednesday please schedule Return A Return appointment in 3 weeks. Allen Derry PA Wednesday please schedule Anesthetic (In clinic) Topical Lidocaine 5% applied to wound bed Bathing/ Shower/ Hygiene May shower with protection but do not get wound dressing(s) wet. Protect dressing(s) with water repellant cover (for example, large plastic bag) or a cast cover and may then take shower. Edema Control - Lymphedema / SCD / Other Bilateral Lower Extremities Elevate legs to the level of the heart or above for 30 minutes daily and/or when sitting for 3-4 times a day throughout the day. Avoid standing for long periods of time. Patient to wear own compression stockings every day. Exercise regularly - As tolerated Moisturize legs daily. Other Edema Control Orders/Instructions: - Recommend Compression stocking 15-49mm/Hg. Brochure for Elastic Therapy with printed leg measurements. Wound Treatment Wound #23 - Metatarsal head first Wound Laterality: Right Cleanser: Soap and Water 3 x Per Week/30 Days Discharge Instructions: May shower and wash wound with dial antibacterial soap and water prior to dressing change. Cleanser: Wound Cleanser 3 x Per Week/30 Days Discharge Instructions: Cleanse the wound with wound cleanser prior to applying a clean dressing using gauze sponges, not tissue or cotton balls. Peri-Wound Care: Sween Lotion (Moisturizing lotion) 3 x Per Week/30  Days Discharge Instructions: Apply moisturizing lotion as directed Prim Dressing: Promogran Prisma Matrix, 4.34 (sq in) (silver collagen) 3 x Per Week/30 Days ary Discharge Instructions: Moisten collagen with saline or hydrogel Secondary Dressing: Optifoam Non-Adhesive Dressing, 4x4 in 3 x Per Week/30 Days Discharge Instructions: Apply foam donut and gauze Secured With: Kerlix Roll Sterile, 4.5x3.1 (in/yd) 3 x Per Week/30 Days Discharge Instructions: Secure with Kerlix as directed. Secured With: 6M Medipore H Soft Cloth Surgical T ape, 4 x 10 (in/yd) 3 x Per Week/30 Days Discharge Instructions: Secure with tape as directed. Compression Wrap: tubigrip size D single layer 3 x Per Week/30 Days Discharge Instructions: apply in the morning and remove at night. Electronic Signature(s) Unsigned Entered By: Shawn Stall on 08/09/2023 14:42:14 -------------------------------------------------------------------------------- Problem List Details Patient Name: Date of Service: [name]. 08/09/2023 2:00 PM Medical Record Number: [id number] Patient Account Number: [id number] Date of Birth/Sex: Treating RN: April 16, 1961 (62 y.o. F) Primary Care Provider: PCP, NO Other Clinician: Referring Provider: Treating Provider/Extender: [name], [name] Weeks in Treatment: 16 Active Problems ICD-10 Encounter Code Description Active Date MDM Diagnosis 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 2 diabetes mellitus with foot ulcer 04/19/2023 No Yes [place],  2:00 PM Medical Record Number: [id number] Patient Account Number: [id number] Date of Birth/Sex: Treating RN: [birth year] (62 y.o. [name], Millard.Loa Primary Care Provider: PCP, NO Other Clinician: Referring Provider: Treating Provider/Extender: Juleen China Weeks in Treatment: 16 The following information was scribed by: Shawn Stall The information was scribed for: Lenda Kelp Verbal / Phone Orders: No Diagnosis Coding ICD-10 Coding Code Description 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 L97.522 Non-pressure chronic ulcer of other part of left foot with fat layer  exposed E11.622 Type 2 diabetes mellitus with other skin ulcer L97.812 Non-pressure chronic ulcer of other part of right lower leg with fat layer exposed I73.89 Other specified peripheral vascular diseases I87.331 Chronic venous hypertension (idiopathic) with ulcer and inflammation of right lower extremity Follow-up Appointments ppointment in 1 week. Allen Derry PA Wednesday 08/09/2023 @2pm (already scheduled) Return A GATHEL, GUINAN ([id number]) [id number] Page 6 of 12 ppointment in 2 weeks. Allen Derry PA Wednesday please schedule Return A Return appointment in 3 weeks. Allen Derry PA Wednesday please schedule Anesthetic (In clinic) Topical Lidocaine 5% applied to wound bed Bathing/ Shower/ Hygiene May shower with protection but do not get wound dressing(s) wet. Protect dressing(s) with water repellant cover (for example, large plastic bag) or a cast cover and may then take shower. Edema Control - Lymphedema / SCD / Other Bilateral Lower Extremities Elevate legs to the level of the heart or above for 30 minutes daily and/or when sitting for 3-4 times a day throughout the day. Avoid standing for long periods of time. Patient to wear own compression stockings every day. Exercise regularly - As tolerated Moisturize legs daily. Other Edema Control Orders/Instructions: - Recommend Compression stocking 15-49mm/Hg. Brochure for Elastic Therapy with printed leg measurements. Wound Treatment Wound #23 - Metatarsal head first Wound Laterality: Right Cleanser: Soap and Water 3 x Per Week/30 Days Discharge Instructions: May shower and wash wound with dial antibacterial soap and water prior to dressing change. Cleanser: Wound Cleanser 3 x Per Week/30 Days Discharge Instructions: Cleanse the wound with wound cleanser prior to applying a clean dressing using gauze sponges, not tissue or cotton balls. Peri-Wound Care: Sween Lotion (Moisturizing lotion) 3 x Per Week/30  Days Discharge Instructions: Apply moisturizing lotion as directed Prim Dressing: Promogran Prisma Matrix, 4.34 (sq in) (silver collagen) 3 x Per Week/30 Days ary Discharge Instructions: Moisten collagen with saline or hydrogel Secondary Dressing: Optifoam Non-Adhesive Dressing, 4x4 in 3 x Per Week/30 Days Discharge Instructions: Apply foam donut and gauze Secured With: Kerlix Roll Sterile, 4.5x3.1 (in/yd) 3 x Per Week/30 Days Discharge Instructions: Secure with Kerlix as directed. Secured With: 6M Medipore H Soft Cloth Surgical T ape, 4 x 10 (in/yd) 3 x Per Week/30 Days Discharge Instructions: Secure with tape as directed. Compression Wrap: tubigrip size D single layer 3 x Per Week/30 Days Discharge Instructions: apply in the morning and remove at night. Electronic Signature(s) Unsigned Entered By: Shawn Stall on 08/09/2023 14:42:14 -------------------------------------------------------------------------------- Problem List Details Patient Name: Date of Service: [name]. 08/09/2023 2:00 PM Medical Record Number: [id number] Patient Account Number: [id number] Date of Birth/Sex: Treating RN: April 16, 1961 (62 y.o. F) Primary Care Provider: PCP, NO Other Clinician: Referring Provider: Treating Provider/Extender: [name], [name] Weeks in Treatment: 16 Active Problems ICD-10 Encounter Code Description Active Date MDM Diagnosis 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 2 diabetes mellitus with foot ulcer 04/19/2023 No Yes Curnutt,  Carolynn Comment ([id number]) [id number] Page 7 of 12 [phone] Non-pressure chronic ulcer of other part of right foot with fat layer exposed 04/19/2023 No Yes L97.522 Non-pressure chronic ulcer of other part of left foot with fat layer exposed 04/19/2023 No Yes E11.622 Type 2 diabetes mellitus with other skin ulcer 04/19/2023 No Yes L97.812 Non-pressure chronic ulcer of other part of right lower leg with fat layer 04/19/2023 No Yes exposed I73.89 Other  specified peripheral vascular diseases 04/19/2023 No Yes I87.331 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 08/09/2023 2:00 PM Medical Record Number: [id number] Patient Account Number: [id number] Date of Birth/Sex: Treating RN: August 27, 1961 (62 y.o. F) Primary Care Provider: PCP, NO Other Clinician: Referring Provider: Treating Provider/Extender: [name], [name] Weeks in Treatment: 16 Subjective Chief Complaint Information obtained from Patient Bilateral LE Ulcers History of Present Illness (HPI) This 62 year old patient who has a very long significant history of diabetes mellitus, previous alcohol and nicotine abuse, chronic data disease, COPD, diabetes mellitus, height hypertension, critical lower limb ischemia with several wounds being managed at the wound Center at [facility] for over a year. She was recently in the ER at [facility] and was referred to our center. He has had a long history of critical limb ischemia and over a period of time has had balloon angioplasties in March 2016, endarterectomy of the left carotid, lower extremity angiogram and treatment by Dr. Nanetta Batty, several cardiac catheterizations. Most recently she had an x-ray of her left foot while in the ER which showed no acute abnormality. during this ER visit she was started on ciprofloxacin and asked to continue with the wound care physicians at [facility]. Her last ABI done in June 2017 showed the right side to be 0.28 in the left side to be 0.48. Her right toe brachial index was 0.17 on the right and 0.27 on  the left. her last hemoglobin A1c was 11.1% She continues to smoke about a pack of cigarettes a day. 03/28/2017 -- -- right foot x-ray -- IMPRESSION:Areas of soft tissue swelling. Mild subluxation second PIP joint. No frank dislocation or fracture. No erosive change or bony destruction. No soft tissue ulceration or radiopaque foreign body evident by radiography. There is plantar fascia calcification with a nearby inferior calcaneal spur. There are foci of arterial vascular calcification. 04/04/2017 -- the patient continues to be noncompliant and continues to complain of a lot of pain and has not done anything about quitting smoking THAILYN, SHEILS ([id number]) [id number] Page 8 of 12 Readmission: 06/26/18 on evaluation today patient presents for reevaluation she has not been seen in our office since May 2018. Since she was last seen here in our office she has undergone testing at Dr. Durenda Age office where it was revealed that she had an ABI of 0.68 with her previous ABI being 0.64 and a left ABI of 0.38 with previous ABI being 0.34 this study which was performed on 04/05/18 was pretty much equivalent to the study performed on 11/22/17. The findings in the end revealed on the right would appear to be moderate right lower extremity arterial disease on the left Dr. Allyson Sabal states moderate in the report but unfortunately this appears to be much more severe at 0.38 compared to  YESINA, LUCIA ([id number]) [id number] Page 1 of 12 Visit Report for 08/09/2023 Chief Complaint Document Details Patient Name: Date of Service: Maryla Morrow 08/09/2023 2:00 PM Medical Record Number: [id number] Patient Account Number: [id number] Date of Birth/Sex: Treating RN: 07/01/1961 (62 y.o. F) Primary Care Provider: PCP, NO Other Clinician: Referring Provider: Treating Provider/Extender: [name], [name] Weeks in Treatment: 16 Information Obtained from: Patient Chief Complaint Bilateral LE Ulcers Electronic Signature(s) Signed: 08/09/2023 2:12:58 PM By: Allen Derry PA-C Entered By: Allen Derry on 08/09/2023 14:12:58 -------------------------------------------------------------------------------- Debridement Details Patient Name: Date of Service: HA LEY, [name] 08/09/2023 2:00 PM Medical Record Number: [id number] Patient Account Number: [id number] Date of Birth/Sex: Treating RN: 1960-12-21 (61 y.o. [name], Millard.Loa Primary Care Provider: PCP, NO Other Clinician: Referring Provider: Treating Provider/Extender: Juleen China Weeks in Treatment: 16 Debridement Performed for Assessment: Wound #23 Right Metatarsal head first Performed By: Physician Lenda Kelp, PA The following information was scribed by: Shawn Stall The information was scribed for: Lenda Kelp Debridement Type: Debridement Severity of Tissue Pre Debridement: Fat layer exposed Level of Consciousness (Pre-procedure): Awake and Alert Pre-procedure Verification/Time Out Yes - 14:35 Taken: Start Time: 14:36 Pain Control: Lidocaine 4% T opical Solution Percent of Wound Bed Debrided: 10% T Area Debrided (cm): otal 0.03 Tissue and other material debrided: Viable, Non-Viable, Callus, Slough, Subcutaneous, Skin: Dermis , Skin: Epidermis, Slough Level: Skin/Subcutaneous Tissue Debridement Description: Excisional Instrument:  Curette Bleeding: Minimum Hemostasis Achieved: Pressure End Time: 14:39 Procedural Pain: 0 Post Procedural Pain: 0 Response to Treatment: Procedure was tolerated well Level of Consciousness (Post- Awake and Alert procedure): Post Debridement Measurements of Total Wound Length: (cm) 0.7 Width: (cm) 0.6 Depth: (cm) 0.3 Delmore, Gurleen S ([id number]) [id number] Page 2 of 12 Volume: (cm) 0.099 Character of Wound/Ulcer Post Debridement: Improved Severity of Tissue Post Debridement: Fat layer exposed Post Procedure Diagnosis Same as Pre-procedure Electronic Signature(s) Unsigned Entered By: Shawn Stall on 08/09/2023 14:39:48 -------------------------------------------------------------------------------- HPI Details Patient Name: Date of Service: [name]. 08/09/2023 2:00 PM Medical Record Number: [id number] Patient Account Number: [id number] Date of Birth/Sex: Treating RN: 1960-11-16 (62 y.o. F) Primary Care Provider: PCP, NO Other Clinician: Referring Provider: Treating Provider/Extender: [name], [name] Weeks in Treatment: 16 History of Present Illness HPI Description: This 62 year old patient who has a very long significant history of diabetes mellitus, previous alcohol and nicotine abuse, chronic data disease, COPD, diabetes mellitus, height hypertension, critical lower limb ischemia with several wounds being managed at the wound Center at [facility] for over a year. She was recently in the ER at [facility] and was referred to our center. He has had a long history of critical limb ischemia and over a period of time has had balloon angioplasties in March 2016, endarterectomy of the left carotid, lower extremity angiogram and treatment by Dr. Nanetta Batty, several cardiac catheterizations. Most recently she had an x-ray of her left foot while in the ER which showed no acute abnormality. during this ER visit she was  started on ciprofloxacin and asked to continue with the wound care physicians at [facility]. Her last ABI done in June 2017 showed the right side to be 0.28 in the left side to be 0.48. Her right toe brachial index was 0.17 on the right and 0.27 on the left. her last hemoglobin A1c was 11.1% She continues to smoke about a pack of cigarettes a day. 03/28/2017 -- -- right foot  Carolynn Comment ([id number]) [id number] Page 7 of 12 [phone] Non-pressure chronic ulcer of other part of right foot with fat layer exposed 04/19/2023 No Yes L97.522 Non-pressure chronic ulcer of other part of left foot with fat layer exposed 04/19/2023 No Yes E11.622 Type 2 diabetes mellitus with other skin ulcer 04/19/2023 No Yes L97.812 Non-pressure chronic ulcer of other part of right lower leg with fat layer 04/19/2023 No Yes exposed I73.89 Other  specified peripheral vascular diseases 04/19/2023 No Yes I87.331 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 08/09/2023 2:00 PM Medical Record Number: [id number] Patient Account Number: [id number] Date of Birth/Sex: Treating RN: August 27, 1961 (62 y.o. F) Primary Care Provider: PCP, NO Other Clinician: Referring Provider: Treating Provider/Extender: [name], [name] Weeks in Treatment: 16 Subjective Chief Complaint Information obtained from Patient Bilateral LE Ulcers History of Present Illness (HPI) This 62 year old patient who has a very long significant history of diabetes mellitus, previous alcohol and nicotine abuse, chronic data disease, COPD, diabetes mellitus, height hypertension, critical lower limb ischemia with several wounds being managed at the wound Center at [facility] for over a year. She was recently in the ER at [facility] and was referred to our center. He has had a long history of critical limb ischemia and over a period of time has had balloon angioplasties in March 2016, endarterectomy of the left carotid, lower extremity angiogram and treatment by Dr. Nanetta Batty, several cardiac catheterizations. Most recently she had an x-ray of her left foot while in the ER which showed no acute abnormality. during this ER visit she was started on ciprofloxacin and asked to continue with the wound care physicians at [facility]. Her last ABI done in June 2017 showed the right side to be 0.28 in the left side to be 0.48. Her right toe brachial index was 0.17 on the right and 0.27 on  the left. her last hemoglobin A1c was 11.1% She continues to smoke about a pack of cigarettes a day. 03/28/2017 -- -- right foot x-ray -- IMPRESSION:Areas of soft tissue swelling. Mild subluxation second PIP joint. No frank dislocation or fracture. No erosive change or bony destruction. No soft tissue ulceration or radiopaque foreign body evident by radiography. There is plantar fascia calcification with a nearby inferior calcaneal spur. There are foci of arterial vascular calcification. 04/04/2017 -- the patient continues to be noncompliant and continues to complain of a lot of pain and has not done anything about quitting smoking THAILYN, SHEILS ([id number]) [id number] Page 8 of 12 Readmission: 06/26/18 on evaluation today patient presents for reevaluation she has not been seen in our office since May 2018. Since she was last seen here in our office she has undergone testing at Dr. Durenda Age office where it was revealed that she had an ABI of 0.68 with her previous ABI being 0.64 and a left ABI of 0.38 with previous ABI being 0.34 this study which was performed on 04/05/18 was pretty much equivalent to the study performed on 11/22/17. The findings in the end revealed on the right would appear to be moderate right lower extremity arterial disease on the left Dr. Allyson Sabal states moderate in the report but unfortunately this appears to be much more severe at 0.38 compared to  YESINA, LUCIA ([id number]) [id number] Page 1 of 12 Visit Report for 08/09/2023 Chief Complaint Document Details Patient Name: Date of Service: Maryla Morrow 08/09/2023 2:00 PM Medical Record Number: [id number] Patient Account Number: [id number] Date of Birth/Sex: Treating RN: 07/01/1961 (62 y.o. F) Primary Care Provider: PCP, NO Other Clinician: Referring Provider: Treating Provider/Extender: [name], [name] Weeks in Treatment: 16 Information Obtained from: Patient Chief Complaint Bilateral LE Ulcers Electronic Signature(s) Signed: 08/09/2023 2:12:58 PM By: Allen Derry PA-C Entered By: Allen Derry on 08/09/2023 14:12:58 -------------------------------------------------------------------------------- Debridement Details Patient Name: Date of Service: HA LEY, [name] 08/09/2023 2:00 PM Medical Record Number: [id number] Patient Account Number: [id number] Date of Birth/Sex: Treating RN: 1960-12-21 (61 y.o. [name], Millard.Loa Primary Care Provider: PCP, NO Other Clinician: Referring Provider: Treating Provider/Extender: Juleen China Weeks in Treatment: 16 Debridement Performed for Assessment: Wound #23 Right Metatarsal head first Performed By: Physician Lenda Kelp, PA The following information was scribed by: Shawn Stall The information was scribed for: Lenda Kelp Debridement Type: Debridement Severity of Tissue Pre Debridement: Fat layer exposed Level of Consciousness (Pre-procedure): Awake and Alert Pre-procedure Verification/Time Out Yes - 14:35 Taken: Start Time: 14:36 Pain Control: Lidocaine 4% T opical Solution Percent of Wound Bed Debrided: 10% T Area Debrided (cm): otal 0.03 Tissue and other material debrided: Viable, Non-Viable, Callus, Slough, Subcutaneous, Skin: Dermis , Skin: Epidermis, Slough Level: Skin/Subcutaneous Tissue Debridement Description: Excisional Instrument:  Curette Bleeding: Minimum Hemostasis Achieved: Pressure End Time: 14:39 Procedural Pain: 0 Post Procedural Pain: 0 Response to Treatment: Procedure was tolerated well Level of Consciousness (Post- Awake and Alert procedure): Post Debridement Measurements of Total Wound Length: (cm) 0.7 Width: (cm) 0.6 Depth: (cm) 0.3 Delmore, Gurleen S ([id number]) [id number] Page 2 of 12 Volume: (cm) 0.099 Character of Wound/Ulcer Post Debridement: Improved Severity of Tissue Post Debridement: Fat layer exposed Post Procedure Diagnosis Same as Pre-procedure Electronic Signature(s) Unsigned Entered By: Shawn Stall on 08/09/2023 14:39:48 -------------------------------------------------------------------------------- HPI Details Patient Name: Date of Service: [name]. 08/09/2023 2:00 PM Medical Record Number: [id number] Patient Account Number: [id number] Date of Birth/Sex: Treating RN: 1960-11-16 (62 y.o. F) Primary Care Provider: PCP, NO Other Clinician: Referring Provider: Treating Provider/Extender: [name], [name] Weeks in Treatment: 16 History of Present Illness HPI Description: This 62 year old patient who has a very long significant history of diabetes mellitus, previous alcohol and nicotine abuse, chronic data disease, COPD, diabetes mellitus, height hypertension, critical lower limb ischemia with several wounds being managed at the wound Center at [facility] for over a year. She was recently in the ER at [facility] and was referred to our center. He has had a long history of critical limb ischemia and over a period of time has had balloon angioplasties in March 2016, endarterectomy of the left carotid, lower extremity angiogram and treatment by Dr. Nanetta Batty, several cardiac catheterizations. Most recently she had an x-ray of her left foot while in the ER which showed no acute abnormality. during this ER visit she was  started on ciprofloxacin and asked to continue with the wound care physicians at [facility]. Her last ABI done in June 2017 showed the right side to be 0.28 in the left side to be 0.48. Her right toe brachial index was 0.17 on the right and 0.27 on the left. her last hemoglobin A1c was 11.1% She continues to smoke about a pack of cigarettes a day. 03/28/2017 -- -- right foot  YESINA, LUCIA ([id number]) [id number] Page 1 of 12 Visit Report for 08/09/2023 Chief Complaint Document Details Patient Name: Date of Service: Maryla Morrow 08/09/2023 2:00 PM Medical Record Number: [id number] Patient Account Number: [id number] Date of Birth/Sex: Treating RN: 07/01/1961 (62 y.o. F) Primary Care Provider: PCP, NO Other Clinician: Referring Provider: Treating Provider/Extender: [name], [name] Weeks in Treatment: 16 Information Obtained from: Patient Chief Complaint Bilateral LE Ulcers Electronic Signature(s) Signed: 08/09/2023 2:12:58 PM By: Allen Derry PA-C Entered By: Allen Derry on 08/09/2023 14:12:58 -------------------------------------------------------------------------------- Debridement Details Patient Name: Date of Service: HA LEY, [name] 08/09/2023 2:00 PM Medical Record Number: [id number] Patient Account Number: [id number] Date of Birth/Sex: Treating RN: 1960-12-21 (61 y.o. [name], Millard.Loa Primary Care Provider: PCP, NO Other Clinician: Referring Provider: Treating Provider/Extender: Juleen China Weeks in Treatment: 16 Debridement Performed for Assessment: Wound #23 Right Metatarsal head first Performed By: Physician Lenda Kelp, PA The following information was scribed by: Shawn Stall The information was scribed for: Lenda Kelp Debridement Type: Debridement Severity of Tissue Pre Debridement: Fat layer exposed Level of Consciousness (Pre-procedure): Awake and Alert Pre-procedure Verification/Time Out Yes - 14:35 Taken: Start Time: 14:36 Pain Control: Lidocaine 4% T opical Solution Percent of Wound Bed Debrided: 10% T Area Debrided (cm): otal 0.03 Tissue and other material debrided: Viable, Non-Viable, Callus, Slough, Subcutaneous, Skin: Dermis , Skin: Epidermis, Slough Level: Skin/Subcutaneous Tissue Debridement Description: Excisional Instrument:  Curette Bleeding: Minimum Hemostasis Achieved: Pressure End Time: 14:39 Procedural Pain: 0 Post Procedural Pain: 0 Response to Treatment: Procedure was tolerated well Level of Consciousness (Post- Awake and Alert procedure): Post Debridement Measurements of Total Wound Length: (cm) 0.7 Width: (cm) 0.6 Depth: (cm) 0.3 Delmore, Gurleen S ([id number]) [id number] Page 2 of 12 Volume: (cm) 0.099 Character of Wound/Ulcer Post Debridement: Improved Severity of Tissue Post Debridement: Fat layer exposed Post Procedure Diagnosis Same as Pre-procedure Electronic Signature(s) Unsigned Entered By: Shawn Stall on 08/09/2023 14:39:48 -------------------------------------------------------------------------------- HPI Details Patient Name: Date of Service: [name]. 08/09/2023 2:00 PM Medical Record Number: [id number] Patient Account Number: [id number] Date of Birth/Sex: Treating RN: 1960-11-16 (62 y.o. F) Primary Care Provider: PCP, NO Other Clinician: Referring Provider: Treating Provider/Extender: [name], [name] Weeks in Treatment: 16 History of Present Illness HPI Description: This 62 year old patient who has a very long significant history of diabetes mellitus, previous alcohol and nicotine abuse, chronic data disease, COPD, diabetes mellitus, height hypertension, critical lower limb ischemia with several wounds being managed at the wound Center at [facility] for over a year. She was recently in the ER at [facility] and was referred to our center. He has had a long history of critical limb ischemia and over a period of time has had balloon angioplasties in March 2016, endarterectomy of the left carotid, lower extremity angiogram and treatment by Dr. Nanetta Batty, several cardiac catheterizations. Most recently she had an x-ray of her left foot while in the ER which showed no acute abnormality. during this ER visit she was  started on ciprofloxacin and asked to continue with the wound care physicians at [facility]. Her last ABI done in June 2017 showed the right side to be 0.28 in the left side to be 0.48. Her right toe brachial index was 0.17 on the right and 0.27 on the left. her last hemoglobin A1c was 11.1% She continues to smoke about a pack of cigarettes a day. 03/28/2017 -- -- right foot  2:00 PM Medical Record Number: [id number] Patient Account Number: [id number] Date of Birth/Sex: Treating RN: [birth year] (62 y.o. [name], Millard.Loa Primary Care Provider: PCP, NO Other Clinician: Referring Provider: Treating Provider/Extender: Juleen China Weeks in Treatment: 16 The following information was scribed by: Shawn Stall The information was scribed for: Lenda Kelp Verbal / Phone Orders: No Diagnosis Coding ICD-10 Coding Code Description 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 L97.522 Non-pressure chronic ulcer of other part of left foot with fat layer  exposed E11.622 Type 2 diabetes mellitus with other skin ulcer L97.812 Non-pressure chronic ulcer of other part of right lower leg with fat layer exposed I73.89 Other specified peripheral vascular diseases I87.331 Chronic venous hypertension (idiopathic) with ulcer and inflammation of right lower extremity Follow-up Appointments ppointment in 1 week. Allen Derry PA Wednesday 08/09/2023 @2pm (already scheduled) Return A GATHEL, GUINAN ([id number]) [id number] Page 6 of 12 ppointment in 2 weeks. Allen Derry PA Wednesday please schedule Return A Return appointment in 3 weeks. Allen Derry PA Wednesday please schedule Anesthetic (In clinic) Topical Lidocaine 5% applied to wound bed Bathing/ Shower/ Hygiene May shower with protection but do not get wound dressing(s) wet. Protect dressing(s) with water repellant cover (for example, large plastic bag) or a cast cover and may then take shower. Edema Control - Lymphedema / SCD / Other Bilateral Lower Extremities Elevate legs to the level of the heart or above for 30 minutes daily and/or when sitting for 3-4 times a day throughout the day. Avoid standing for long periods of time. Patient to wear own compression stockings every day. Exercise regularly - As tolerated Moisturize legs daily. Other Edema Control Orders/Instructions: - Recommend Compression stocking 15-49mm/Hg. Brochure for [employer] with printed leg measurements. Wound Treatment Wound #23 - Metatarsal head first Wound Laterality: Right Cleanser: Soap and Water 3 x Per Week/30 Days Discharge Instructions: May shower and wash wound with dial antibacterial soap and water prior to dressing change. Cleanser: Wound Cleanser 3 x Per Week/30 Days Discharge Instructions: Cleanse the wound with wound cleanser prior to applying a clean dressing using gauze sponges, not tissue or cotton balls. Peri-Wound Care: Sween Lotion (Moisturizing lotion) 3 x Per Week/30  Days Discharge Instructions: Apply moisturizing lotion as directed Prim Dressing: Promogran Prisma Matrix, 4.34 (sq in) (silver collagen) 3 x Per Week/30 Days ary Discharge Instructions: Moisten collagen with saline or hydrogel Secondary Dressing: Optifoam Non-Adhesive Dressing, 4x4 in 3 x Per Week/30 Days Discharge Instructions: Apply foam donut and gauze Secured With: Kerlix Roll Sterile, 4.5x3.1 (in/yd) 3 x Per Week/30 Days Discharge Instructions: Secure with Kerlix as directed. Secured With: 6M Medipore H Soft Cloth Surgical T ape, 4 x 10 (in/yd) 3 x Per Week/30 Days Discharge Instructions: Secure with tape as directed. Compression Wrap: tubigrip size D single layer 3 x Per Week/30 Days Discharge Instructions: apply in the morning and remove at night. Electronic Signature(s) Unsigned Entered By: Shawn Stall on 08/09/2023 14:42:14 -------------------------------------------------------------------------------- Problem List Details Patient Name: Date of Service: [name]. 08/09/2023 2:00 PM Medical Record Number: [id number] Patient Account Number: [id number] Date of Birth/Sex: Treating RN: April 16, 1961 (62 y.o. F) Primary Care Provider: PCP, NO Other Clinician: Referring Provider: Treating Provider/Extender: [name], [name] Weeks in Treatment: 16 Active Problems ICD-10 Encounter Code Description Active Date MDM Diagnosis 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 2 diabetes mellitus with foot ulcer 04/19/2023 No Yes C[place]utt,  2:00 PM Medical Record Number: [id number] Patient Account Number: [id number] Date of Birth/Sex: Treating RN: [birth year] (62 y.o. [name], Millard.Loa Primary Care Provider: PCP, NO Other Clinician: Referring Provider: Treating Provider/Extender: Juleen China Weeks in Treatment: 16 The following information was scribed by: Shawn Stall The information was scribed for: Lenda Kelp Verbal / Phone Orders: No Diagnosis Coding ICD-10 Coding Code Description 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 L97.522 Non-pressure chronic ulcer of other part of left foot with fat layer  exposed E11.622 Type 2 diabetes mellitus with other skin ulcer L97.812 Non-pressure chronic ulcer of other part of right lower leg with fat layer exposed I73.89 Other specified peripheral vascular diseases I87.331 Chronic venous hypertension (idiopathic) with ulcer and inflammation of right lower extremity Follow-up Appointments ppointment in 1 week. Allen Derry PA Wednesday 08/09/2023 @2pm (already scheduled) Return A GATHEL, GUINAN ([id number]) [id number] Page 6 of 12 ppointment in 2 weeks. Allen Derry PA Wednesday please schedule Return A Return appointment in 3 weeks. Allen Derry PA Wednesday please schedule Anesthetic (In clinic) Topical Lidocaine 5% applied to wound bed Bathing/ Shower/ Hygiene May shower with protection but do not get wound dressing(s) wet. Protect dressing(s) with water repellant cover (for example, large plastic bag) or a cast cover and may then take shower. Edema Control - Lymphedema / SCD / Other Bilateral Lower Extremities Elevate legs to the level of the heart or above for 30 minutes daily and/or when sitting for 3-4 times a day throughout the day. Avoid standing for long periods of time. Patient to wear own compression stockings every day. Exercise regularly - As tolerated Moisturize legs daily. Other Edema Control Orders/Instructions: - Recommend Compression stocking 15-49mm/Hg. Brochure for Elastic Therapy with printed leg measurements. Wound Treatment Wound #23 - Metatarsal head first Wound Laterality: Right Cleanser: Soap and Water 3 x Per Week/30 Days Discharge Instructions: May shower and wash wound with dial antibacterial soap and water prior to dressing change. Cleanser: Wound Cleanser 3 x Per Week/30 Days Discharge Instructions: Cleanse the wound with wound cleanser prior to applying a clean dressing using gauze sponges, not tissue or cotton balls. Peri-Wound Care: Sween Lotion (Moisturizing lotion) 3 x Per Week/30  Days Discharge Instructions: Apply moisturizing lotion as directed Prim Dressing: Promogran Prisma Matrix, 4.34 (sq in) (silver collagen) 3 x Per Week/30 Days ary Discharge Instructions: Moisten collagen with saline or hydrogel Secondary Dressing: Optifoam Non-Adhesive Dressing, 4x4 in 3 x Per Week/30 Days Discharge Instructions: Apply foam donut and gauze Secured With: Kerlix Roll Sterile, 4.5x3.1 (in/yd) 3 x Per Week/30 Days Discharge Instructions: Secure with Kerlix as directed. Secured With: 6M Medipore H Soft Cloth Surgical T ape, 4 x 10 (in/yd) 3 x Per Week/30 Days Discharge Instructions: Secure with tape as directed. Compression Wrap: tubigrip size D single layer 3 x Per Week/30 Days Discharge Instructions: apply in the morning and remove at night. Electronic Signature(s) Unsigned Entered By: Shawn Stall on 08/09/2023 14:42:14 -------------------------------------------------------------------------------- Problem List Details Patient Name: Date of Service: [name]. 08/09/2023 2:00 PM Medical Record Number: [id number] Patient Account Number: [id number] Date of Birth/Sex: Treating RN: April 16, 1961 (62 y.o. F) Primary Care Provider: PCP, NO Other Clinician: Referring Provider: Treating Provider/Extender: [name], [name] Weeks in Treatment: 16 Active Problems ICD-10 Encounter Code Description Active Date MDM Diagnosis 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 2 diabetes mellitus with foot ulcer 04/19/2023 No Yes Curnutt,  YESINA, LUCIA ([id number]) [id number] Page 1 of 12 Visit Report for 08/09/2023 Chief Complaint Document Details Patient Name: Date of Service: Maryla Morrow 08/09/2023 2:00 PM Medical Record Number: [id number] Patient Account Number: [id number] Date of Birth/Sex: Treating RN: 07/01/1961 (62 y.o. F) Primary Care Provider: PCP, NO Other Clinician: Referring Provider: Treating Provider/Extender: [name], [name] Weeks in Treatment: 16 Information Obtained from: Patient Chief Complaint Bilateral LE Ulcers Electronic Signature(s) Signed: 08/09/2023 2:12:58 PM By: Allen Derry PA-C Entered By: Allen Derry on 08/09/2023 14:12:58 -------------------------------------------------------------------------------- Debridement Details Patient Name: Date of Service: HA LEY, [name] 08/09/2023 2:00 PM Medical Record Number: [id number] Patient Account Number: [id number] Date of Birth/Sex: Treating RN: 1960-12-21 (61 y.o. [name], Millard.Loa Primary Care Provider: PCP, NO Other Clinician: Referring Provider: Treating Provider/Extender: Juleen China Weeks in Treatment: 16 Debridement Performed for Assessment: Wound #23 Right Metatarsal head first Performed By: Physician Lenda Kelp, PA The following information was scribed by: Shawn Stall The information was scribed for: Lenda Kelp Debridement Type: Debridement Severity of Tissue Pre Debridement: Fat layer exposed Level of Consciousness (Pre-procedure): Awake and Alert Pre-procedure Verification/Time Out Yes - 14:35 Taken: Start Time: 14:36 Pain Control: Lidocaine 4% T opical Solution Percent of Wound Bed Debrided: 10% T Area Debrided (cm): otal 0.03 Tissue and other material debrided: Viable, Non-Viable, Callus, Slough, Subcutaneous, Skin: Dermis , Skin: Epidermis, Slough Level: Skin/Subcutaneous Tissue Debridement Description: Excisional Instrument:  Curette Bleeding: Minimum Hemostasis Achieved: Pressure End Time: 14:39 Procedural Pain: 0 Post Procedural Pain: 0 Response to Treatment: Procedure was tolerated well Level of Consciousness (Post- Awake and Alert procedure): Post Debridement Measurements of Total Wound Length: (cm) 0.7 Width: (cm) 0.6 Depth: (cm) 0.3 Delmore, Gurleen S ([id number]) [id number] Page 2 of 12 Volume: (cm) 0.099 Character of Wound/Ulcer Post Debridement: Improved Severity of Tissue Post Debridement: Fat layer exposed Post Procedure Diagnosis Same as Pre-procedure Electronic Signature(s) Unsigned Entered By: Shawn Stall on 08/09/2023 14:39:48 -------------------------------------------------------------------------------- HPI Details Patient Name: Date of Service: [name]. 08/09/2023 2:00 PM Medical Record Number: [id number] Patient Account Number: [id number] Date of Birth/Sex: Treating RN: 1960-11-16 (62 y.o. F) Primary Care Provider: PCP, NO Other Clinician: Referring Provider: Treating Provider/Extender: [name], [name] Weeks in Treatment: 16 History of Present Illness HPI Description: This 62 year old patient who has a very long significant history of diabetes mellitus, previous alcohol and nicotine abuse, chronic data disease, COPD, diabetes mellitus, height hypertension, critical lower limb ischemia with several wounds being managed at the wound Center at [facility] for over a year. She was recently in the ER at [facility] and was referred to our center. He has had a long history of critical limb ischemia and over a period of time has had balloon angioplasties in March 2016, endarterectomy of the left carotid, lower extremity angiogram and treatment by Dr. Nanetta Batty, several cardiac catheterizations. Most recently she had an x-ray of her left foot while in the ER which showed no acute abnormality. during this ER visit she was  started on ciprofloxacin and asked to continue with the wound care physicians at [facility]. Her last ABI done in June 2017 showed the right side to be 0.28 in the left side to be 0.48. Her right toe brachial index was 0.17 on the right and 0.27 on the left. her last hemoglobin A1c was 11.1% She continues to smoke about a pack of cigarettes a day. 03/28/2017 -- -- right foot  2:00 PM Medical Record Number: [id number] Patient Account Number: [id number] Date of Birth/Sex: Treating RN: [birth year] (62 y.o. [name], Millard.Loa Primary Care Provider: PCP, NO Other Clinician: Referring Provider: Treating Provider/Extender: Juleen China Weeks in Treatment: 16 The following information was scribed by: Shawn Stall The information was scribed for: Lenda Kelp Verbal / Phone Orders: No Diagnosis Coding ICD-10 Coding Code Description 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 L97.522 Non-pressure chronic ulcer of other part of left foot with fat layer  exposed E11.622 Type 2 diabetes mellitus with other skin ulcer L97.812 Non-pressure chronic ulcer of other part of right lower leg with fat layer exposed I73.89 Other specified peripheral vascular diseases I87.331 Chronic venous hypertension (idiopathic) with ulcer and inflammation of right lower extremity Follow-up Appointments ppointment in 1 week. Allen Derry PA Wednesday 08/09/2023 @2pm (already scheduled) Return A GATHEL, GUINAN ([id number]) [id number] Page 6 of 12 ppointment in 2 weeks. Allen Derry PA Wednesday please schedule Return A Return appointment in 3 weeks. Allen Derry PA Wednesday please schedule Anesthetic (In clinic) Topical Lidocaine 5% applied to wound bed Bathing/ Shower/ Hygiene May shower with protection but do not get wound dressing(s) wet. Protect dressing(s) with water repellant cover (for example, large plastic bag) or a cast cover and may then take shower. Edema Control - Lymphedema / SCD / Other Bilateral Lower Extremities Elevate legs to the level of the heart or above for 30 minutes daily and/or when sitting for 3-4 times a day throughout the day. Avoid standing for long periods of time. Patient to wear own compression stockings every day. Exercise regularly - As tolerated Moisturize legs daily. Other Edema Control Orders/Instructions: - Recommend Compression stocking 15-49mm/Hg. Brochure for Elastic Therapy with printed leg measurements. Wound Treatment Wound #23 - Metatarsal head first Wound Laterality: Right Cleanser: Soap and Water 3 x Per Week/30 Days Discharge Instructions: May shower and wash wound with dial antibacterial soap and water prior to dressing change. Cleanser: Wound Cleanser 3 x Per Week/30 Days Discharge Instructions: Cleanse the wound with wound cleanser prior to applying a clean dressing using gauze sponges, not tissue or cotton balls. Peri-Wound Care: Sween Lotion (Moisturizing lotion) 3 x Per Week/30  Days Discharge Instructions: Apply moisturizing lotion as directed Prim Dressing: Promogran Prisma Matrix, 4.34 (sq in) (silver collagen) 3 x Per Week/30 Days ary Discharge Instructions: Moisten collagen with saline or hydrogel Secondary Dressing: Optifoam Non-Adhesive Dressing, 4x4 in 3 x Per Week/30 Days Discharge Instructions: Apply foam donut and gauze Secured With: Kerlix Roll Sterile, 4.5x3.1 (in/yd) 3 x Per Week/30 Days Discharge Instructions: Secure with Kerlix as directed. Secured With: 6M Medipore H Soft Cloth Surgical T ape, 4 x 10 (in/yd) 3 x Per Week/30 Days Discharge Instructions: Secure with tape as directed. Compression Wrap: tubigrip size D single layer 3 x Per Week/30 Days Discharge Instructions: apply in the morning and remove at night. Electronic Signature(s) Unsigned Entered By: Shawn Stall on 08/09/2023 14:42:14 -------------------------------------------------------------------------------- Problem List Details Patient Name: Date of Service: [name]. 08/09/2023 2:00 PM Medical Record Number: [id number] Patient Account Number: [id number] Date of Birth/Sex: Treating RN: April 16, 1961 (62 y.o. F) Primary Care Provider: PCP, NO Other Clinician: Referring Provider: Treating Provider/Extender: [name], [name] Weeks in Treatment: 16 Active Problems ICD-10 Encounter Code Description Active Date MDM Diagnosis 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 2 diabetes mellitus with foot ulcer 04/19/2023 No Yes Curnutt,  YESINA, LUCIA ([id number]) [id number] Page 1 of 12 Visit Report for 08/09/2023 Chief Complaint Document Details Patient Name: Date of Service: Maryla Morrow 08/09/2023 2:00 PM Medical Record Number: [id number] Patient Account Number: [id number] Date of Birth/Sex: Treating RN: 07/01/1961 (62 y.o. F) Primary Care Provider: PCP, NO Other Clinician: Referring Provider: Treating Provider/Extender: [name], [name] Weeks in Treatment: 16 Information Obtained from: Patient Chief Complaint Bilateral LE Ulcers Electronic Signature(s) Signed: 08/09/2023 2:12:58 PM By: Allen Derry PA-C Entered By: Allen Derry on 08/09/2023 14:12:58 -------------------------------------------------------------------------------- Debridement Details Patient Name: Date of Service: HA LEY, [name] 08/09/2023 2:00 PM Medical Record Number: [id number] Patient Account Number: [id number] Date of Birth/Sex: Treating RN: 1960-12-21 (61 y.o. [name], Millard.Loa Primary Care Provider: PCP, NO Other Clinician: Referring Provider: Treating Provider/Extender: Juleen China Weeks in Treatment: 16 Debridement Performed for Assessment: Wound #23 Right Metatarsal head first Performed By: Physician Lenda Kelp, PA The following information was scribed by: Shawn Stall The information was scribed for: Lenda Kelp Debridement Type: Debridement Severity of Tissue Pre Debridement: Fat layer exposed Level of Consciousness (Pre-procedure): Awake and Alert Pre-procedure Verification/Time Out Yes - 14:35 Taken: Start Time: 14:36 Pain Control: Lidocaine 4% T opical Solution Percent of Wound Bed Debrided: 10% T Area Debrided (cm): otal 0.03 Tissue and other material debrided: Viable, Non-Viable, Callus, Slough, Subcutaneous, Skin: Dermis , Skin: Epidermis, Slough Level: Skin/Subcutaneous Tissue Debridement Description: Excisional Instrument:  Curette Bleeding: Minimum Hemostasis Achieved: Pressure End Time: 14:39 Procedural Pain: 0 Post Procedural Pain: 0 Response to Treatment: Procedure was tolerated well Level of Consciousness (Post- Awake and Alert procedure): Post Debridement Measurements of Total Wound Length: (cm) 0.7 Width: (cm) 0.6 Depth: (cm) 0.3 Delmore, Gurleen S ([id number]) [id number] Page 2 of 12 Volume: (cm) 0.099 Character of Wound/Ulcer Post Debridement: Improved Severity of Tissue Post Debridement: Fat layer exposed Post Procedure Diagnosis Same as Pre-procedure Electronic Signature(s) Unsigned Entered By: Shawn Stall on 08/09/2023 14:39:48 -------------------------------------------------------------------------------- HPI Details Patient Name: Date of Service: [name]. 08/09/2023 2:00 PM Medical Record Number: [id number] Patient Account Number: [id number] Date of Birth/Sex: Treating RN: 1960-11-16 (62 y.o. F) Primary Care Provider: PCP, NO Other Clinician: Referring Provider: Treating Provider/Extender: [name], [name] Weeks in Treatment: 16 History of Present Illness HPI Description: This 62 year old patient who has a very long significant history of diabetes mellitus, previous alcohol and nicotine abuse, chronic data disease, COPD, diabetes mellitus, height hypertension, critical lower limb ischemia with several wounds being managed at the wound Center at [facility] for over a year. She was recently in the ER at [facility] and was referred to our center. He has had a long history of critical limb ischemia and over a period of time has had balloon angioplasties in March 2016, endarterectomy of the left carotid, lower extremity angiogram and treatment by Dr. Nanetta Batty, several cardiac catheterizations. Most recently she had an x-ray of her left foot while in the ER which showed no acute abnormality. during this ER visit she was  started on ciprofloxacin and asked to continue with the wound care physicians at [facility]. Her last ABI done in June 2017 showed the right side to be 0.28 in the left side to be 0.48. Her right toe brachial index was 0.17 on the right and 0.27 on the left. her last hemoglobin A1c was 11.1% She continues to smoke about a pack of cigarettes a day. 03/28/2017 -- -- right foot  2:00 PM Medical Record Number: [id number] Patient Account Number: [id number] Date of Birth/Sex: Treating RN: [birth year] (62 y.o. [name], Millard.Loa Primary Care Provider: PCP, NO Other Clinician: Referring Provider: Treating Provider/Extender: Juleen China Weeks in Treatment: 16 The following information was scribed by: Shawn Stall The information was scribed for: Lenda Kelp Verbal / Phone Orders: No Diagnosis Coding ICD-10 Coding Code Description 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 L97.522 Non-pressure chronic ulcer of other part of left foot with fat layer  exposed E11.622 Type 2 diabetes mellitus with other skin ulcer L97.812 Non-pressure chronic ulcer of other part of right lower leg with fat layer exposed I73.89 Other specified peripheral vascular diseases I87.331 Chronic venous hypertension (idiopathic) with ulcer and inflammation of right lower extremity Follow-up Appointments ppointment in 1 week. Allen Derry PA Wednesday 08/09/2023 @2pm (already scheduled) Return A GATHEL, GUINAN ([id number]) [id number] Page 6 of 12 ppointment in 2 weeks. Allen Derry PA Wednesday please schedule Return A Return appointment in 3 weeks. Allen Derry PA Wednesday please schedule Anesthetic (In clinic) Topical Lidocaine 5% applied to wound bed Bathing/ Shower/ Hygiene May shower with protection but do not get wound dressing(s) wet. Protect dressing(s) with water repellant cover (for example, large plastic bag) or a cast cover and may then take shower. Edema Control - Lymphedema / SCD / Other Bilateral Lower Extremities Elevate legs to the level of the heart or above for 30 minutes daily and/or when sitting for 3-4 times a day throughout the day. Avoid standing for long periods of time. Patient to wear own compression stockings every day. Exercise regularly - As tolerated Moisturize legs daily. Other Edema Control Orders/Instructions: - Recommend Compression stocking 15-49mm/Hg. Brochure for Elastic Therapy with printed leg measurements. Wound Treatment Wound #23 - Metatarsal head first Wound Laterality: Right Cleanser: Soap and Water 3 x Per Week/30 Days Discharge Instructions: May shower and wash wound with dial antibacterial soap and water prior to dressing change. Cleanser: Wound Cleanser 3 x Per Week/30 Days Discharge Instructions: Cleanse the wound with wound cleanser prior to applying a clean dressing using gauze sponges, not tissue or cotton balls. Peri-Wound Care: Sween Lotion (Moisturizing lotion) 3 x Per Week/30  Days Discharge Instructions: Apply moisturizing lotion as directed Prim Dressing: Promogran Prisma Matrix, 4.34 (sq in) (silver collagen) 3 x Per Week/30 Days ary Discharge Instructions: Moisten collagen with saline or hydrogel Secondary Dressing: Optifoam Non-Adhesive Dressing, 4x4 in 3 x Per Week/30 Days Discharge Instructions: Apply foam donut and gauze Secured With: Kerlix Roll Sterile, 4.5x3.1 (in/yd) 3 x Per Week/30 Days Discharge Instructions: Secure with Kerlix as directed. Secured With: 6M Medipore H Soft Cloth Surgical T ape, 4 x 10 (in/yd) 3 x Per Week/30 Days Discharge Instructions: Secure with tape as directed. Compression Wrap: tubigrip size D single layer 3 x Per Week/30 Days Discharge Instructions: apply in the morning and remove at night. Electronic Signature(s) Unsigned Entered By: Shawn Stall on 08/09/2023 14:42:14 -------------------------------------------------------------------------------- Problem List Details Patient Name: Date of Service: [name]. 08/09/2023 2:00 PM Medical Record Number: [id number] Patient Account Number: [id number] Date of Birth/Sex: Treating RN: April 16, 1961 (62 y.o. F) Primary Care Provider: PCP, NO Other Clinician: Referring Provider: Treating Provider/Extender: [name], [name] Weeks in Treatment: 16 Active Problems ICD-10 Encounter Code Description Active Date MDM Diagnosis 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 2 diabetes mellitus with foot ulcer 04/19/2023 No Yes Curnutt,  2:00 PM Medical Record Number: [id number] Patient Account Number: [id number] Date of Birth/Sex: Treating RN: [birth year] (62 y.o. [name], Millard.Loa Primary Care Provider: PCP, NO Other Clinician: Referring Provider: Treating Provider/Extender: Juleen China Weeks in Treatment: 16 The following information was scribed by: Shawn Stall The information was scribed for: Lenda Kelp Verbal / Phone Orders: No Diagnosis Coding ICD-10 Coding Code Description 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 L97.522 Non-pressure chronic ulcer of other part of left foot with fat layer  exposed E11.622 Type 2 diabetes mellitus with other skin ulcer L97.812 Non-pressure chronic ulcer of other part of right lower leg with fat layer exposed I73.89 Other specified peripheral vascular diseases I87.331 Chronic venous hypertension (idiopathic) with ulcer and inflammation of right lower extremity Follow-up Appointments ppointment in 1 week. Allen Derry PA Wednesday 08/09/2023 @2pm (already scheduled) Return A GATHEL, GUINAN ([id number]) [id number] Page 6 of 12 ppointment in 2 weeks. Allen Derry PA Wednesday please schedule Return A Return appointment in 3 weeks. Allen Derry PA Wednesday please schedule Anesthetic (In clinic) Topical Lidocaine 5% applied to wound bed Bathing/ Shower/ Hygiene May shower with protection but do not get wound dressing(s) wet. Protect dressing(s) with water repellant cover (for example, large plastic bag) or a cast cover and may then take shower. Edema Control - Lymphedema / SCD / Other Bilateral Lower Extremities Elevate legs to the level of the heart or above for 30 minutes daily and/or when sitting for 3-4 times a day throughout the day. Avoid standing for long periods of time. Patient to wear own compression stockings every day. Exercise regularly - As tolerated Moisturize legs daily. Other Edema Control Orders/Instructions: - Recommend Compression stocking 15-49mm/Hg. Brochure for Elastic Therapy with printed leg measurements. Wound Treatment Wound #23 - Metatarsal head first Wound Laterality: Right Cleanser: Soap and Water 3 x Per Week/30 Days Discharge Instructions: May shower and wash wound with dial antibacterial soap and water prior to dressing change. Cleanser: Wound Cleanser 3 x Per Week/30 Days Discharge Instructions: Cleanse the wound with wound cleanser prior to applying a clean dressing using gauze sponges, not tissue or cotton balls. Peri-Wound Care: Sween Lotion (Moisturizing lotion) 3 x Per Week/30  Days Discharge Instructions: Apply moisturizing lotion as directed Prim Dressing: Promogran Prisma Matrix, 4.34 (sq in) (silver collagen) 3 x Per Week/30 Days ary Discharge Instructions: Moisten collagen with saline or hydrogel Secondary Dressing: Optifoam Non-Adhesive Dressing, 4x4 in 3 x Per Week/30 Days Discharge Instructions: Apply foam donut and gauze Secured With: Kerlix Roll Sterile, 4.5x3.1 (in/yd) 3 x Per Week/30 Days Discharge Instructions: Secure with Kerlix as directed. Secured With: 6M Medipore H Soft Cloth Surgical T ape, 4 x 10 (in/yd) 3 x Per Week/30 Days Discharge Instructions: Secure with tape as directed. Compression Wrap: tubigrip size D single layer 3 x Per Week/30 Days Discharge Instructions: apply in the morning and remove at night. Electronic Signature(s) Unsigned Entered By: Shawn Stall on 08/09/2023 14:42:14 -------------------------------------------------------------------------------- Problem List Details Patient Name: Date of Service: [name]. 08/09/2023 2:00 PM Medical Record Number: [id number] Patient Account Number: [id number] Date of Birth/Sex: Treating RN: April 16, 1961 (62 y.o. F) Primary Care Provider: PCP, NO Other Clinician: Referring Provider: Treating Provider/Extender: [name], [name] Weeks in Treatment: 16 Active Problems ICD-10 Encounter Code Description Active Date MDM Diagnosis 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 2 diabetes mellitus with foot ulcer 04/19/2023 No Yes Curnutt,  Carolynn Comment ([id number]) [id number] Page 7 of 12 [phone] Non-pressure chronic ulcer of other part of right foot with fat layer exposed 04/19/2023 No Yes L97.522 Non-pressure chronic ulcer of other part of left foot with fat layer exposed 04/19/2023 No Yes E11.622 Type 2 diabetes mellitus with other skin ulcer 04/19/2023 No Yes L97.812 Non-pressure chronic ulcer of other part of right lower leg with fat layer 04/19/2023 No Yes exposed I73.89 Other  specified peripheral vascular diseases 04/19/2023 No Yes I87.331 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 08/09/2023 2:00 PM Medical Record Number: [id number] Patient Account Number: [id number] Date of Birth/Sex: Treating RN: August 27, 1961 (62 y.o. F) Primary Care Provider: PCP, NO Other Clinician: Referring Provider: Treating Provider/Extender: [name], [name] Weeks in Treatment: 16 Subjective Chief Complaint Information obtained from Patient Bilateral LE Ulcers History of Present Illness (HPI) This 62 year old patient who has a very long significant history of diabetes mellitus, previous alcohol and nicotine abuse, chronic data disease, COPD, diabetes mellitus, height hypertension, critical lower limb ischemia with several wounds being managed at the wound Center at [facility] for over a year. She was recently in the ER at [facility] and was referred to our center. He has had a long history of critical limb ischemia and over a period of time has had balloon angioplasties in March 2016, endarterectomy of the left carotid, lower extremity angiogram and treatment by Dr. Nanetta Batty, several cardiac catheterizations. Most recently she had an x-ray of her left foot while in the ER which showed no acute abnormality. during this ER visit she was started on ciprofloxacin and asked to continue with the wound care physicians at [facility]. Her last ABI done in June 2017 showed the right side to be 0.28 in the left side to be 0.48. Her right toe brachial index was 0.17 on the right and 0.27 on  the left. her last hemoglobin A1c was 11.1% She continues to smoke about a pack of cigarettes a day. 03/28/2017 -- -- right foot x-ray -- IMPRESSION:Areas of soft tissue swelling. Mild subluxation second PIP joint. No frank dislocation or fracture. No erosive change or bony destruction. No soft tissue ulceration or radiopaque foreign body evident by radiography. There is plantar fascia calcification with a nearby inferior calcaneal spur. There are foci of arterial vascular calcification. 04/04/2017 -- the patient continues to be noncompliant and continues to complain of a lot of pain and has not done anything about quitting smoking THAILYN, SHEILS ([id number]) [id number] Page 8 of 12 Readmission: 06/26/18 on evaluation today patient presents for reevaluation she has not been seen in our office since May 2018. Since she was last seen here in our office she has undergone testing at Dr. Durenda Age office where it was revealed that she had an ABI of 0.68 with her previous ABI being 0.64 and a left ABI of 0.38 with previous ABI being 0.34 this study which was performed on 04/05/18 was pretty much equivalent to the study performed on 11/22/17. The findings in the end revealed on the right would appear to be moderate right lower extremity arterial disease on the left Dr. Allyson Sabal states moderate in the report but unfortunately this appears to be much more severe at 0.38 compared to

## 2023-08-10 NOTE — Progress Notes (Signed)
Karla, Price ([id number]) [id number] Page 1 of 6 Visit Report for 08/09/2023 Arrival Information Details Patient Name: Date of Service: [name] 08/09/2023 2:00 PM Medical Record Number: [id number] Patient Account Number: [id number] Date of Birth/Sex: Treating RN: 15-Feb-1961 (62 y.o. F) Primary Care Caileen Veracruz: PCP, NO Other Clinician: Referring Corleen Otwell: Treating Tamikia Chowning/Extender: [name], [name] Weeks in Treatment: 16 Visit Information History Since Last Visit Added or deleted any medications: No Patient Arrived: Wheel Chair Any new allergies or adverse reactions: No Arrival Time: 14:17 Had a fall or experienced change in No Accompanied By: self activities of daily living that may affect Transfer Assistance: None risk of falls: Patient Identification Verified: Yes Signs or symptoms of abuse/neglect since last visito No Secondary Verification Process Completed: Yes Hospitalized since last visit: No Patient Requires Transmission-Based Precautions: No Implantable device outside of the clinic excluding No Patient Has Alerts: Yes cellular tissue based products placed in the center Patient Alerts: ABI R 0.57 (01/11/23) since last visit: ABI L 1.04 (01/11/23) Has Dressing in Place as Prescribed: Yes Pain Present Now: Yes Electronic Signature(s) Signed: 08/09/2023 2:28:43 PM By: Karl Ito Entered By: Karl Ito on 08/09/2023 14:19:19 -------------------------------------------------------------------------------- Encounter Discharge Information Details Patient Name: Date of Service: Karla Bigness, [name] 08/09/2023 2:00 PM Medical Record Number: [id number] Patient Account Number: [id number] Date of Birth/Sex: Treating RN: 1960/11/22 (61 y.o. [name] Primary Care Reida Hem: PCP, NO Other Clinician: Referring Trig Mcbryar: Treating Rily Nickey/Extender: Juleen China Weeks in Treatment: 16 Encounter  Discharge Information Items Post Procedure Vitals Discharge Condition: Stable Temperature (F): 98.1 Ambulatory Status: Ambulatory Pulse (bpm): 60 Discharge Destination: Home Respiratory Rate (breaths/min): 18 Transportation: Private Auto Blood Pressure (mmHg): 156/75 Accompanied By: self Schedule Follow-up Appointment: Yes Clinical Summary of Care: Electronic Signature(s) Signed: 08/09/2023 7:24:00 PM By: Shawn Stall RN, BSN Entered By: Shawn Stall on 08/09/2023 14:43:34 [name] ([id number]) [id number] Page 2 of 6 -------------------------------------------------------------------------------- Lower Extremity Assessment Details Patient Name: Date of Service: [name]. 08/09/2023 2:00 PM Medical Record Number: [id number] Patient Account Number: [id number] Date of Birth/Sex: Treating RN: [birth year] (62 y.o. [name], Millard.Loa Primary Care Maple Odaniel: PCP, NO Other Clinician: Referring Joby Hershkowitz: Treating Diara Chaudhari/Extender: [name], [name] Weeks in Treatment: 16 Edema Assessment Assessed: [Left: No] [Right: Yes] Edema: [Left: Ye] [Right: s] Calf Left: Right: Point of Measurement: 30 cm From Medial Instep 43 cm Ankle Left: Right: Point of Measurement: 9 cm From Medial Instep 24 cm Knee To Floor Left: Right: From Medial Instep 41 cm Vascular Assessment Pulses: Dorsalis Pedis Palpable: [Right:Yes] Extremity colors, hair growth, and conditions: Extremity Color: [Right:Hyperpigmented] Hair Growth on Extremity: [Right:No] Temperature of Extremity: [Right:Warm] Capillary Refill: [Right:< 3 seconds] Dependent Rubor: [Right:No Yes] Toe Nail Assessment Left: Right: Thick: No Discolored: No Deformed: No Improper Length and Hygiene: No Electronic Signature(s) Signed: 08/09/2023 7:24:00 PM By: Shawn Stall RN, BSN Entered By: Shawn Stall on 08/09/2023  14:47:51 -------------------------------------------------------------------------------- Multi-Disciplinary Care Plan Details Patient Name: Date of Service: Karla Bigness, [name] 08/09/2023 2:00 PM Medical Record Number: [id number] Patient Account Number: [id number] Date of Birth/Sex: Treating RN: 03-10-1961 (61 y.o. [name] Primary Care Ethanael Veith: PCP, NO Other Clinician: Referring Arinze Rivadeneira: Treating Ly Wass/Extender: [name], [name] Weeks in Treatment: [address], [place] ([id number]) [id number] Page 3 of 6 Active Inactive Wound/Skin Impairment Nursing Diagnoses: Impaired tissue integrity Goals: Patient/caregiver will verbalize understanding of skin care regimen Date Initiated: 04/19/2023 Target Resolution Date: 10/19/2023 Goal Status:  Active Interventions: Assess ulceration(s) every visit Treatment Activities: Skin care regimen initiated : 04/19/2023 Notes: Electronic Signature(s) Signed: 08/09/2023 7:24:00 PM By: Shawn Stall RN, BSN Entered By: Shawn Stall on 08/09/2023 14:42:21 -------------------------------------------------------------------------------- Pain Assessment Details Patient Name: Date of Service: Karla Bigness, [name] 08/09/2023 2:00 PM Medical Record Number: [id number] Patient Account Number: [id number] Date of Birth/Sex: Treating RN: 1961/07/05 (61 y.o. F) Primary Care Draysen Weygandt: PCP, NO Other Clinician: Referring Charlize Hathaway: Treating Fayelynn Distel/Extender: [name], [name] Weeks in Treatment: 16 Active Problems Location of Pain Severity and Description of Pain Patient Has Paino No Site Locations Pain Management and Medication Current Pain Management: Electronic Signature(s) Signed: 08/09/2023 2:28:43 PM By: Karl Ito Entered By: Karl Ito on 08/09/2023 14:19:42 [name] ([id number]) [id number] Page 4 of  6 -------------------------------------------------------------------------------- Patient/Caregiver Education Details Patient Name: Date of Service: [name] 9/25/2024andnbsp2:00 PM Medical Record Number: [id number] Patient Account Number: [id number] Date of Birth/Gender: Treating RN: Oct 22, 1961 (62 y.o. [name] Primary Care Physician: PCP, NO Other Clinician: Referring Physician: Treating Physician/Extender: Juleen China Weeks in Treatment: 16 Education Assessment Education Provided To: Patient Education Topics Provided Wound/Skin Impairment: Handouts: Caring for Your Ulcer Methods: Explain/Verbal Responses: Reinforcements needed Electronic Signature(s) Signed: 08/09/2023 7:24:00 PM By: Shawn Stall RN, BSN Entered By: Shawn Stall on 08/09/2023 14:42:32 -------------------------------------------------------------------------------- Wound Assessment Details Patient Name: Date of Service: Karla Bigness, [name] 08/09/2023 2:00 PM Medical Record Number: [id number] Patient Account Number: [id number] Date of Birth/Sex: Treating RN: 11-24-1960 (61 y.o. [name] Primary Care Tyberius Ryner: PCP, NO Other Clinician: Referring Zivah Mayr: Treating Porche Steinberger/Extender: [name], [name] Weeks in Treatment: 16 Wound Status Wound Number: 23 Primary Diabetic Wound/Ulcer of the Lower Extremity Etiology: Wound Location: Right Metatarsal head first Wound Open Wounding Event: Gradually Appeared Status: Date Acquired: 04/18/2021 Comorbid Chronic Obstructive Pulmonary Disease (COPD), Sleep Apnea, Weeks Of Treatment: 16 History: Arrhythmia, Congestive Heart Failure, Coronary Artery Disease, Clustered Wound: No Hypertension, Peripheral Arterial Disease, Cirrhosis , Type II Diabetes, Osteoarthritis, Neuropathy Photos MARITHZA, MATTEUCCI ([id number]) [id number] Page 5 of 6 Wound Measurements Length: (cm) 0.7 Width: (cm)  0.6 Depth: (cm) 0.3 Area: (cm) 0.33 Volume: (cm) 0.099 % Reduction in Area: 65% % Reduction in Volume: 79% Epithelialization: Medium (34-66%) Tunneling: No Undermining: No Wound Description Classification: Grade 2 Wound Margin: Distinct, outline attached Exudate Amount: Medium Exudate Type: Serosanguineous Exudate Color: red, brown Foul Odor After Cleansing: No Slough/Fibrino Yes Wound Bed Granulation Amount: Large (67-100%) Exposed Structure Granulation Quality: Red Fascia Exposed: No Necrotic Amount: Small (1-33%) Fat Layer (Subcutaneous Tissue) Exposed: Yes Necrotic Quality: Adherent Slough Tendon Exposed: No Muscle Exposed: No Joint Exposed: No Bone Exposed: No Periwound Skin Texture Texture Color No Abnormalities Noted: No No Abnormalities Noted: Yes Callus: Yes Temperature / Pain Temperature: No Abnormality Moisture No Abnormalities Noted: No Dry / Scaly: No Maceration: Yes Treatment Notes Wound #23 (Metatarsal head first) Wound Laterality: Right Cleanser Soap and Water Discharge Instruction: May shower and wash wound with dial antibacterial soap and water prior to dressing change. Wound Cleanser Discharge Instruction: Cleanse the wound with wound cleanser prior to applying a clean dressing using gauze sponges, not tissue or cotton balls. Peri-Wound Care Sween Lotion (Moisturizing lotion) Discharge Instruction: Apply moisturizing lotion as directed Topical Primary Dressing Promogran Prisma Matrix, 4.34 (sq in) (silver collagen) Discharge Instruction: Moisten collagen with saline or hydrogel Secondary Dressing Optifoam Non-Adhesive Dressing, 4x4 in Discharge Instruction: Apply foam donut and gauze Secured With [employer] Sterile, 4.5x3.1 (  Karla, Price ([id number]) [id number] Page 1 of 6 Visit Report for 08/09/2023 Arrival Information Details Patient Name: Date of Service: [name] 08/09/2023 2:00 PM Medical Record Number: [id number] Patient Account Number: [id number] Date of Birth/Sex: Treating RN: 15-Feb-1961 (62 y.o. F) Primary Care Caileen Veracruz: PCP, NO Other Clinician: Referring Corleen Otwell: Treating Tamikia Chowning/Extender: [name], [name] Weeks in Treatment: 16 Visit Information History Since Last Visit Added or deleted any medications: No Patient Arrived: Wheel Chair Any new allergies or adverse reactions: No Arrival Time: 14:17 Had a fall or experienced change in No Accompanied By: self activities of daily living that may affect Transfer Assistance: None risk of falls: Patient Identification Verified: Yes Signs or symptoms of abuse/neglect since last visito No Secondary Verification Process Completed: Yes Hospitalized since last visit: No Patient Requires Transmission-Based Precautions: No Implantable device outside of the clinic excluding No Patient Has Alerts: Yes cellular tissue based products placed in the center Patient Alerts: ABI R 0.57 (01/11/23) since last visit: ABI L 1.04 (01/11/23) Has Dressing in Place as Prescribed: Yes Pain Present Now: Yes Electronic Signature(s) Signed: 08/09/2023 2:28:43 PM By: Karl Ito Entered By: Karl Ito on 08/09/2023 14:19:19 -------------------------------------------------------------------------------- Encounter Discharge Information Details Patient Name: Date of Service: Karla Bigness, [name] 08/09/2023 2:00 PM Medical Record Number: [id number] Patient Account Number: [id number] Date of Birth/Sex: Treating RN: 1960/11/22 (61 y.o. [name] Primary Care Reida Hem: PCP, NO Other Clinician: Referring Trig Mcbryar: Treating Rily Nickey/Extender: Juleen China Weeks in Treatment: 16 Encounter  Discharge Information Items Post Procedure Vitals Discharge Condition: Stable Temperature (F): 98.1 Ambulatory Status: Ambulatory Pulse (bpm): 60 Discharge Destination: Home Respiratory Rate (breaths/min): 18 Transportation: Private Auto Blood Pressure (mmHg): 156/75 Accompanied By: self Schedule Follow-up Appointment: Yes Clinical Summary of Care: Electronic Signature(s) Signed: 08/09/2023 7:24:00 PM By: Shawn Stall RN, BSN Entered By: Shawn Stall on 08/09/2023 14:43:34 [name] ([id number]) [id number] Page 2 of 6 -------------------------------------------------------------------------------- Lower Extremity Assessment Details Patient Name: Date of Service: [name]. 08/09/2023 2:00 PM Medical Record Number: [id number] Patient Account Number: [id number] Date of Birth/Sex: Treating RN: [birth year] (62 y.o. [name], Millard.Loa Primary Care Maple Odaniel: PCP, NO Other Clinician: Referring Joby Hershkowitz: Treating Diara Chaudhari/Extender: [name], [name] Weeks in Treatment: 16 Edema Assessment Assessed: [Left: No] [Right: Yes] Edema: [Left: Ye] [Right: s] Calf Left: Right: Point of Measurement: 30 cm From Medial Instep 43 cm Ankle Left: Right: Point of Measurement: 9 cm From Medial Instep 24 cm Knee To Floor Left: Right: From Medial Instep 41 cm Vascular Assessment Pulses: Dorsalis Pedis Palpable: [Right:Yes] Extremity colors, hair growth, and conditions: Extremity Color: [Right:Hyperpigmented] Hair Growth on Extremity: [Right:No] Temperature of Extremity: [Right:Warm] Capillary Refill: [Right:< 3 seconds] Dependent Rubor: [Right:No Yes] Toe Nail Assessment Left: Right: Thick: No Discolored: No Deformed: No Improper Length and Hygiene: No Electronic Signature(s) Signed: 08/09/2023 7:24:00 PM By: Shawn Stall RN, BSN Entered By: Shawn Stall on 08/09/2023  14:47:51 -------------------------------------------------------------------------------- Multi-Disciplinary Care Plan Details Patient Name: Date of Service: Karla Bigness, [name] 08/09/2023 2:00 PM Medical Record Number: [id number] Patient Account Number: [id number] Date of Birth/Sex: Treating RN: 03-10-1961 (61 y.o. [name] Primary Care Ethanael Veith: PCP, NO Other Clinician: Referring Arinze Rivadeneira: Treating Ly Wass/Extender: [name], [name] Weeks in Treatment: [address], [place] ([id number]) [id number] Page 3 of 6 Active Inactive Wound/Skin Impairment Nursing Diagnoses: Impaired tissue integrity Goals: Patient/caregiver will verbalize understanding of skin care regimen Date Initiated: 04/19/2023 Target Resolution Date: 10/19/2023 Goal Status:

## 2023-08-11 ENCOUNTER — Telehealth (HOSPITAL_COMMUNITY): Payer: Self-pay | Admitting: Adult Health

## 2023-08-11 ENCOUNTER — Encounter (HOSPITAL_COMMUNITY)
Admission: RE | Disposition: A | Discharge status: Home / Self Care | Payer: Self-pay | Source: Home / Self Care | Attending: Vascular Surgery

## 2023-08-11 ENCOUNTER — Other Ambulatory Visit: Payer: Self-pay

## 2023-08-11 ENCOUNTER — Ambulatory Visit (HOSPITAL_COMMUNITY)
Admission: RE | Admit: 2023-08-11 | Discharge: 2023-08-11 | Disposition: A | Discharge status: Home / Self Care | Payer: 59 | Attending: Vascular Surgery | Admitting: Vascular Surgery

## 2023-08-11 DIAGNOSIS — E1151 Type 2 diabetes mellitus with diabetic peripheral angiopathy without gangrene: Secondary | ICD-10-CM | POA: Diagnosis present

## 2023-08-11 DIAGNOSIS — I70229 Atherosclerosis of native arteries of extremities with rest pain, unspecified extremity: Secondary | ICD-10-CM

## 2023-08-11 DIAGNOSIS — E1122 Type 2 diabetes mellitus with diabetic chronic kidney disease: Secondary | ICD-10-CM | POA: Insufficient documentation

## 2023-08-11 DIAGNOSIS — I5042 Chronic combined systolic (congestive) and diastolic (congestive) heart failure: Secondary | ICD-10-CM | POA: Insufficient documentation

## 2023-08-11 DIAGNOSIS — I70234 Atherosclerosis of native arteries of right leg with ulceration of heel and midfoot: Secondary | ICD-10-CM | POA: Diagnosis not present

## 2023-08-11 DIAGNOSIS — Z87891 Personal history of nicotine dependence: Secondary | ICD-10-CM | POA: Insufficient documentation

## 2023-08-11 DIAGNOSIS — I130 Hypertensive heart and chronic kidney disease with heart failure and stage 1 through stage 4 chronic kidney disease, or unspecified chronic kidney disease: Secondary | ICD-10-CM | POA: Diagnosis not present

## 2023-08-11 DIAGNOSIS — N183 Chronic kidney disease, stage 3 unspecified: Secondary | ICD-10-CM | POA: Diagnosis not present

## 2023-08-11 DIAGNOSIS — E11621 Type 2 diabetes mellitus with foot ulcer: Secondary | ICD-10-CM | POA: Diagnosis not present

## 2023-08-11 DIAGNOSIS — L97419 Non-pressure chronic ulcer of right heel and midfoot with unspecified severity: Secondary | ICD-10-CM | POA: Diagnosis not present

## 2023-08-11 HISTORY — PX: PERIPHERAL VASCULAR INTERVENTION: CATH118257

## 2023-08-11 HISTORY — PX: ABDOMINAL AORTOGRAM W/LOWER EXTREMITY: CATH118223

## 2023-08-11 LAB — GLUCOSE, CAPILLARY
Glucose-Capillary: 362 mg/dL — ABNORMAL HIGH (ref 70–99)
Glucose-Capillary: 372 mg/dL — ABNORMAL HIGH (ref 70–99)
Glucose-Capillary: 371 mg/dL — ABNORMAL HIGH (ref 70–99)

## 2023-08-11 LAB — POCT I-STAT, CHEM 8
BUN: 103 mg/dL — ABNORMAL HIGH (ref 6–20)
Calcium, Ion: 1.14 mmol/L — ABNORMAL LOW (ref 1.15–1.40)
Chloride: 94 mmol/L — ABNORMAL LOW (ref 98–111)
Creatinine, Ser: 1.9 mg/dL — ABNORMAL HIGH (ref 0.44–1.00)
Glucose, Bld: 393 mg/dL — ABNORMAL HIGH (ref 70–99)
HCT: 30 % — ABNORMAL LOW (ref 36.0–46.0)
Hemoglobin: 10.2 g/dL — ABNORMAL LOW (ref 12.0–15.0)
Potassium: 4 mmol/L (ref 3.5–5.1)
Sodium: 136 mmol/L (ref 135–145)
TCO2: 29 mmol/L (ref 22–32)

## 2023-08-11 LAB — POCT ACTIVATED CLOTTING TIME
Activated Clotting Time: 189 s
Activated Clotting Time: 177 s

## 2023-08-11 SURGERY — ABDOMINAL AORTOGRAM W/LOWER EXTREMITY
Anesthesia: LOCAL

## 2023-08-11 MED ORDER — MIDAZOLAM HCL 2 MG/2ML IJ SOLN
INTRAMUSCULAR | Status: DC | PRN
  Administered 2023-08-11: 1 mg via INTRAVENOUS

## 2023-08-11 MED ORDER — FENTANYL CITRATE (PF) 100 MCG/2ML IJ SOLN
INTRAMUSCULAR | Status: DC | PRN
  Administered 2023-08-11: 50 ug via INTRAVENOUS

## 2023-08-11 MED ORDER — SODIUM CHLORIDE 0.9% FLUSH: 3.0000 mL | Freq: Two times a day (BID) | INTRAVENOUS | Status: DC

## 2023-08-11 MED ORDER — ASPIRIN 81 MG PO TBEC: 81.0000 mg | DELAYED_RELEASE_TABLET | Freq: Every day | ORAL | Status: DC

## 2023-08-11 MED ORDER — IODIXANOL 320 MG/ML IV SOLN
INTRAVENOUS | Status: DC | PRN
  Administered 2023-08-11: 50 mL via INTRA_ARTERIAL

## 2023-08-11 MED ORDER — CLOPIDOGREL BISULFATE 300 MG PO TABS
ORAL_TABLET | ORAL | Status: DC | PRN
  Administered 2023-08-11: 300 mg via ORAL

## 2023-08-11 MED ORDER — SODIUM CHLORIDE 0.9% FLUSH: 3.0000 mL | INTRAVENOUS | Status: DC | PRN

## 2023-08-11 MED ORDER — SODIUM CHLORIDE 0.9 % IV SOLN: 250.0000 mL | INTRAVENOUS | Status: DC | PRN

## 2023-08-11 MED ORDER — LABETALOL HCL 5 MG/ML IV SOLN: 10.0000 mg | INTRAVENOUS | Status: DC | PRN

## 2023-08-11 MED ORDER — CLOPIDOGREL BISULFATE 75 MG PO TABS: 75.0000 mg | ORAL_TABLET | Freq: Every day | ORAL | Status: DC

## 2023-08-11 MED ORDER — HEPARIN SODIUM (PORCINE) 1000 UNIT/ML IJ SOLN
INTRAMUSCULAR | Status: DC | PRN
  Administered 2023-08-11: 10000 [IU] via INTRAVENOUS

## 2023-08-11 MED ORDER — ONDANSETRON HCL 4 MG/2ML IJ SOLN: 4.0000 mg | Freq: Four times a day (QID) | INTRAMUSCULAR | Status: DC | PRN

## 2023-08-11 MED ORDER — SODIUM CHLORIDE 0.9 % IV SOLN: INTRAVENOUS | Status: DC

## 2023-08-11 MED ORDER — HYDRALAZINE HCL 20 MG/ML IJ SOLN: 5.0000 mg | INTRAMUSCULAR | Status: DC | PRN

## 2023-08-11 MED ORDER — MIDAZOLAM HCL 2 MG/2ML IJ SOLN
INTRAMUSCULAR | Status: AC
  Filled 2023-08-11: qty 2

## 2023-08-11 MED ORDER — CLOPIDOGREL BISULFATE 300 MG PO TABS
ORAL_TABLET | ORAL | Status: AC
  Filled 2023-08-11: qty 1

## 2023-08-11 MED ORDER — HEPARIN SODIUM (PORCINE) 1000 UNIT/ML IJ SOLN
INTRAMUSCULAR | Status: AC
  Filled 2023-08-11: qty 10

## 2023-08-11 MED ORDER — LIDOCAINE HCL (PF) 1 % IJ SOLN
INTRAMUSCULAR | Status: AC
  Filled 2023-08-11: qty 30

## 2023-08-11 MED ORDER — ASPIRIN 81 MG PO CHEW
CHEWABLE_TABLET | ORAL | Status: AC
  Filled 2023-08-11: qty 1

## 2023-08-11 MED ORDER — SODIUM CHLORIDE 0.9 % WEIGHT BASED INFUSION: 1.0000 mL/kg/h | INTRAVENOUS | Status: DC

## 2023-08-11 MED ORDER — INSULIN ASPART 100 UNIT/ML IJ SOLN: 0.0000 [IU] | Freq: Every day | INTRAMUSCULAR | Status: DC

## 2023-08-11 MED ORDER — CLOPIDOGREL BISULFATE 75 MG PO TABS: 300.0000 mg | ORAL_TABLET | Freq: Once | ORAL | Status: DC

## 2023-08-11 MED ORDER — HEPARIN (PORCINE) IN NACL 1000-0.9 UT/500ML-% IV SOLN
INTRAVENOUS | Status: DC | PRN
  Administered 2023-08-11 (×2): 500 mL

## 2023-08-11 MED ORDER — ACETAMINOPHEN 325 MG PO TABS: 650.0000 mg | ORAL_TABLET | ORAL | Status: DC | PRN

## 2023-08-11 MED ORDER — ASPIRIN 81 MG PO CHEW
CHEWABLE_TABLET | ORAL | Status: DC | PRN
  Administered 2023-08-11: 81 mg via ORAL

## 2023-08-11 MED ORDER — FENTANYL CITRATE (PF) 100 MCG/2ML IJ SOLN
INTRAMUSCULAR | Status: AC
  Filled 2023-08-11: qty 2

## 2023-08-11 MED ORDER — LIDOCAINE HCL (PF) 1 % IJ SOLN
INTRAMUSCULAR | Status: DC | PRN
  Administered 2023-08-11: 30 mL via INTRADERMAL

## 2023-08-11 MED ORDER — INSULIN ASPART 100 UNIT/ML IJ SOLN
0.0000 [IU] | Freq: Three times a day (TID) | INTRAMUSCULAR | Status: DC
  Administered 2023-08-11: 15 [IU] via SUBCUTANEOUS
  Filled 2023-08-11: qty 1

## 2023-08-11 SURGICAL SUPPLY — 24 items
BALLN MUSTANG 5X150X135 (BALLOONS) ×2
BALLOON MUSTANG 5X150X135 (BALLOONS) IMPLANT
CATH OMNI FLUSH 5F 65CM (CATHETERS) IMPLANT
CATH QUICKCROSS .018X135CM (MICROCATHETER) IMPLANT
CATH TEMPO AQUA 5F 100CM (CATHETERS) IMPLANT
GLIDEWIRE ADV .035X260CM (WIRE) IMPLANT
KIT ANGIASSIST CO2 SYSTEM (KITS) IMPLANT
KIT ENCORE 26 ADVANTAGE (KITS) IMPLANT
KIT MICROPUNCTURE NIT STIFF (SHEATH) IMPLANT
KIT PV (KITS) ×2 IMPLANT
PROTECTION STATION PRESSURIZED (MISCELLANEOUS) ×2
SET ATX-X65L (MISCELLANEOUS) IMPLANT
SHEATH CATAPULT 6FR 45 (SHEATH) IMPLANT
SHEATH PINNACLE 5F 10CM (SHEATH) IMPLANT
SHEATH PINNACLE 6F 10CM (SHEATH) IMPLANT
SHEATH PROBE COVER 6X72 (BAG) IMPLANT
STATION PROTECTION PRESSURIZED (MISCELLANEOUS) IMPLANT
STENT ELUVIA 6X150X130 (Permanent Stent) IMPLANT
STENT ELUVIA 6X80X130 (Permanent Stent) IMPLANT
SYR MEDRAD MARK 7 150ML (SYRINGE) ×2 IMPLANT
TRANSDUCER W/STOPCOCK (MISCELLANEOUS) ×2 IMPLANT
TRAY PV CATH (CUSTOM PROCEDURE TRAY) ×2 IMPLANT
WIRE BENTSON .035X145CM (WIRE) IMPLANT
WIRE COMMAND ST 018 300CM (WIRE) IMPLANT

## 2023-08-11 NOTE — Progress Notes (Signed)
Site area: left groin Site Prior to Removal:  Level 0 Pressure Applied For: 30 minutes Manual:   yes Patient Status During Pull:  stable Post Pull Site:  Level 0 Post Pull Instructions Given:  yes Post Pull Pulses Present: left dp dopplered Dressing Applied:  gauze and tegaderm Bedrest begins @ 1145 Comments:

## 2023-08-11 NOTE — Telephone Encounter (Signed)
Error

## 2023-08-11 NOTE — Op Note (Addendum)
DATE OF SERVICE: 08/11/2023  PATIENT:  [name]  62 y.o. female  PRE-OPERATIVE DIAGNOSIS:  Atherosclerosis of native arteries of right lower extremity causing ulceration  POST-OPERATIVE DIAGNOSIS:  Same  PROCEDURE:   1) Ultrasound guided left common femoral artery access 2) Aortogram 3) Right lower extremity angiogram with second order cannulation (50mL total contrast) 4) Additional right lower extremity angiogram with third order cannulation 5) Right femoropopliteal angioplasty and stenting (6x137mm + 6x39mm Eluvia) 6) Conscious sedation (52 minutes)  SURGEON:  Rande Brunt. Lenell Antu, MD  ASSISTANT: none  ANESTHESIA:   local and IV sedation  ESTIMATED BLOOD LOSS: minimal  LOCAL MEDICATIONS USED:  LIDOCAINE   COUNTS: confirmed correct.  PATIENT DISPOSITION:  PACU - hemodynamically stable.   Delay start of Pharmacological VTE agent (>24hrs) due to surgical blood loss or risk of bleeding: no  INDICATION FOR PROCEDURE: [name] is a 62 y.o. female with right foot ulceration in setting of severe peripheral arterial disease. After careful discussion of risks, benefits, and alternatives the patient was offered angiography with intervention. The patient understood and wished to proceed.  OPERATIVE FINDINGS:  Patent SMA Patent renal arteries Patent infrarenal aorta Patent common iliac arteries bilaterally Patent hypogastric arteries bilaterally Patent external iliac arteries bilaterally  Right lower extremity: Common femoral artery: Patent  Profunda femoris artery: Patent  Superficial femoral artery: Mid SFA 99% calcified stenosis; distal SFA 70% diffuse stenosis Popliteal artery: Above knee popliteal artery 70% diffuse stenosis; no significant  Anterior tibial artery: occluded proximally; dimunitive artery reconstitutes about the ankle and courses into the foot Tibioperoneal trunk: occluded Peroneal artery: occluded proximally; reconstitutes distally and courses to the  ankle  Posterior tibial artery: occluded Pedal circulation: disadvantaged  GLASS score. FP 3. IP 4. Stage III.  WIfI score. 2 / 2 / 0. Stage III.   DESCRIPTION OF PROCEDURE: After identification of the patient in the pre-operative holding area, the patient was transferred to the operating room. The patient was positioned supine on the operating room table. Anesthesia was induced. The groins was prepped and draped in standard fashion. A surgical pause was performed confirming correct patient, procedure, and operative location.  The left groin was anesthetized with subcutaneous injection of 1% lidocaine. Using ultrasound guidance, the left common femoral artery was accessed with micropuncture technique. Fluoroscopy was used to confirm cannulation over the femoral head. The 11F sheath was upsized to 89F.   A Benson wire was advanced into the distal aorta. Over the wire an omni flush catheter was advanced to the level of L2. Aortogram was performed - see above for details.   The right common iliac artery was selected with an omniflush catheter and benson guidewire. The wire was advanced into the common femoral artery. Over the wire the omni flush catheter was advanced into the external iliac artery. Selective angiography was performed - see above for details.   The decision was made to intervene. The patient was heparinized with 10,000 units of heparin. The 89F sheath was exchanged for a 18F x 45cm sheath. Selective angiography of the left lower extremity was performed prior to intervention.   The lesions were treated with: Right femoropopliteal angioplasty and stenting (6x160mm + 6x17mm Eluvia)  I was unable to cross the TP trunk / proximal peroneal occlusion  Completion angiography revealed:  Resolution of femoropopliteal disease Trifurcation occlusion Disadvantaged flow about the ankle and foot  The sheath was left in place to be removed in the recovery area.  Conscious sedation was  administered  with the use of IV fentanyl and midazolam under continuous physician and nurse monitoring.  Heart rate, blood pressure, and oxygen saturation were continuously monitored.  Total sedation time was 52 minutes  Upon completion of the case instrument and sharps counts were confirmed correct. The patient was transferred to the PACU in good condition. I was present for all portions of the procedure.  PLAN: ASA / Eliquis / Statin. No good options for further revascularization. Will re-evaluate in a month. High risk for major amputation.  Rande Brunt. Lenell Antu, MD [facility] Phone Number: [phone] 08/11/2023 10:19 AM

## 2023-08-11 NOTE — H&P (Signed)
[facility] [place]  ASSESSMENT / PLAN: 62 y.o. female with atherosclerosis of native arteries of right lower extremity causing ulceration.   WIfI score calculated based on clinical exam and non-invasive measurements: 2 / 2 / 0. Stage III.   Recommend:  Abstinence from all tobacco products. Blood glucose control with goal A1c < 7%. Blood pressure control with goal blood pressure < 140/90 mmHg. Lipid reduction therapy with goal LDL-C <100 mg/dL  Aspirin 81mg  PO QD.  Atorvastatin 40-80mg  PO QD (or other "high intensity" statin therapy).   Angiogram with intervention today   CHIEF COMPLAINT: right foot ulcer  HISTORY OF PRESENT ILLNESS: [name] is a 62 y.o. female who presents to clinic for evaluation of right foot ulceration after referral by PA Stone.  She is a prior patient of my partner, Dr. Tawanna Cooler Early who performed a left common femoral to posterior tibial artery bypass for limb threatening ischemia in 2020.  She has noticed development of a right plantar ulcer which is very painful to her.  She has previously undergone TMA.  I reviewed the rationale for an angiogram for limb salvage.  She is understanding and willing to proceed.   Past Medical History:  Diagnosis Date   Alcohol abuse    Alcoholic cirrhosis ([name])    Anemia    Anxiety    Arthritis    "knees" (11/26/2018)   B12 deficiency    CAD (coronary artery disease)    a. 11/10/2014 Cath: LM nl, LAD min irregs, D1 30 ost, D2 50d, LCX 24m, OM1 80 p/m (1.5 mm vessel), OM2 44m, RCA nondom 30m-->med rx.. Demand ischemia in the setting of rapid a-fib.   Cardiomyopathy ([name])    Carotid artery disease ([name])    a. 01/2015 Carotid Angio: RICA 100, LICA 95p; b. 02/2015 s/p L CEA; c. 05/2019 Carotid U/S: RICA 100. RECA >50. LICA 1-39%.   Cellulitis    lower extremities   CHF (congestive heart failure) ([name])    Chronic combined systolic and diastolic CHF (congestive heart failure) ([name])    a. 10/2014 Echo:  EF 40-45%; b. 10/2018 Echo: EF 45-50%, gr2 DD; c. 11/2019 Echo: EF 50%, mild LVH, gr2 DD (restrictive), antlat HK, Nl RV fxn. Mild BAE. RVSP 59.1mmHg.   CKD (chronic kidney disease), stage III ([name])    Cocaine abuse ([name])    COPD (chronic obstructive pulmonary disease) ([name])    Critical lower limb ischemia ([name])    Depression    Diabetes mellitus without complication ([name])    Diabetic peripheral neuropathy ([name])    DVT (deep venous thrombosis) ([name])    Dyspnea    Elevated troponin    a. Chronic elevation.   GERD (gastroesophageal reflux disease)    Hyperlipemia    Hypertension    Hypokalemia    Hypomagnesemia    Hypothyroidism    Marijuana abuse    Narcotic abuse ([name])    Noncompliance    NSVT (nonsustained ventricular tachycardia) ([name])    Obesity    PAF (paroxysmal atrial fibrillation) ([name])    Paroxysmal atrial tachycardia    Peripheral arterial disease ([name])    a. 01/2015 Angio/PTA: RSFA 99 (atherectomy/pta) - 1 vessel runoff via diff dzs peroneal; b. 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   Pneumonia    "once or twice" (11/26/2018)   Poorly controlled type 2 diabetes mellitus ([name])    Renal disorder    Renal insufficiency  a. 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   Sleep apnea    "couldn't handle wearing the mask" (11/26/2018)   Symptomatic bradycardia    a. Avoid AV blocking agent per EP. Prev req temp wire in 2017.   Tobacco abuse     Past Surgical History:  Procedure Laterality Date   ABDOMINAL AORTOGRAM N/A 06/26/2019   Procedure: ABDOMINAL AORTOGRAM;  Surgeon: Iran Ouch, MD;  Location: [facility];  Service: Cardiovascular;  Laterality: N/A;   ABDOMINAL AORTOGRAM W/LOWER EXTREMITY N/A 06/07/2021   Procedure: ABDOMINAL AORTOGRAM W/LOWER EXTREMITY;  Surgeon: Runell Gess, MD;  Location: [facility];  Service: Cardiovascular;  Laterality: N/A;   AMPUTATION Right 06/14/2017   Procedure: Right foot transmetatarsal amputation;  Surgeon:  Nadara Mustard, MD;  Location: [facility] OR;  Service: Orthopedics;  Laterality: Right;   AMPUTATION Left 06/28/2019   Procedure: AMPUTATION LEFT FIFTH TOE;  Surgeon: Larina Earthly, MD;  Location: [facility] OR;  Service: Vascular;  Laterality: Left;   AMPUTATION TOE Right 04/28/2017   Procedure: AMPUTATION OF RIGHT SECOND RAY;  Surgeon: Nadara Mustard, MD;  Location: [facility] OR;  Service: Orthopedics;  Laterality: Right;   CARDIAC CATHETERIZATION     CARDIAC CATHETERIZATION N/A 01/12/2016   Procedure: Temporary Wire;  Surgeon: Rollene Rotunda, MD;  Location: [facility];  Service: Cardiovascular;  Laterality: N/A;   CARDIOVERSION  ~ 02/2013   "twice"    CAROTID ANGIOGRAM N/A 01/15/2015   Procedure: CAROTID ANGIOGRAM;  Surgeon: Runell Gess, MD;  Location: [facility] CATH LAB;  Service: Cardiovascular;  Laterality: N/A;   DILATION AND CURETTAGE OF UTERUS  1988   ENDARTERECTOMY Left 02/19/2015   Procedure: LEFT CAROTID ENDARTERECTOMY ;  Surgeon: Nada Libman, MD;  Location: [facility] OR;  Service: Vascular;  Laterality: Left;   FEMORAL-TIBIAL BYPASS GRAFT Left 06/28/2019   Procedure: BYPASS GRAFT LEFT LEG FEMORAL TO ANTERIOR TIBIAL ARTERY using LEFT GREATER SAPHENOUS VEIN;  Surgeon: Larina Earthly, MD;  Location: [facility] OR;  Service: Vascular;  Laterality: Left;   LEFT HEART CATHETERIZATION WITH CORONARY ANGIOGRAM N/A 10/31/2014   Procedure: LEFT HEART CATHETERIZATION WITH CORONARY ANGIOGRAM;  Surgeon: Kathleene Hazel, MD;  Location: [facility] CATH LAB;  Service: Cardiovascular;  Laterality: N/A;   LOWER EXTREMITY ANGIOGRAM N/A 09/10/2013   Procedure: LOWER EXTREMITY ANGIOGRAM;  Surgeon: Runell Gess, MD;  Location: [facility] CATH LAB;  Service: Cardiovascular;  Laterality: N/A;   LOWER EXTREMITY ANGIOGRAM N/A 01/15/2015   Procedure: LOWER EXTREMITY ANGIOGRAM;  Surgeon: Runell Gess, MD;  Location: [facility] CATH LAB;  Service: Cardiovascular;  Laterality: N/A;   LOWER EXTREMITY ANGIOGRAPHY N/A 04/13/2017   Procedure: Lower Extremity  Angiography;  Surgeon: Runell Gess, MD;  Location: [facility] INVASIVE CV LAB;  Service: Cardiovascular;  Laterality: N/A;   LOWER EXTREMITY ANGIOGRAPHY Left 06/26/2019   Procedure: LOWER EXTREMITY ANGIOGRAPHY;  Surgeon: Iran Ouch, MD;  Location: [facility];  Service: Cardiovascular;  Laterality: Left;   PERIPHERAL VASCULAR ATHERECTOMY Right 06/07/2021   Procedure: PERIPHERAL VASCULAR ATHERECTOMY;  Surgeon: Runell Gess, MD;  Location: [facility] INVASIVE CV LAB;  Service: Cardiovascular;  Laterality: Right;   PERIPHERAL VASCULAR BALLOON ANGIOPLASTY Left 06/26/2019   Procedure: PERIPHERAL VASCULAR BALLOON ANGIOPLASTY;  Surgeon: Iran Ouch, MD;  Location: [facility];  Service: Cardiovascular;  Laterality: Left;  unable to cross lt sfa occlusion   PERIPHERAL VASCULAR INTERVENTION  04/13/2017   Procedure: Peripheral Vascular Intervention;  Surgeon: Runell Gess, MD;  Location: [facility];  Service: Cardiovascular;;   PRESSURE SENSOR/CARDIOMEMS N/A 02/05/2020   Procedure: PRESSURE SENSOR/CARDIOMEMS;  Surgeon: Laurey Morale, MD;  Location: [facility] INVASIVE CV LAB;  Service: Cardiovascular;  Laterality: N/A;   VEIN HARVEST Left 06/28/2019   Procedure: LEFT LEG GREATER SAPHENOUS VEIN HARVEST;  Surgeon: Larina Earthly, MD;  Location: [facility] OR;  Service: Vascular;  Laterality: Left;    Family History  Problem Relation Age of Onset   Hypertension Mother    Diabetes Mother    Cancer Mother        breast, ovarian, colon   Clotting disorder Mother    Heart disease Mother    Heart attack Mother    Breast cancer Mother        in 84's   Hypertension Father    Heart disease Father    Emphysema Sister        smoked    Social History   Socioeconomic History   Marital status: Single    Spouse name: Not on file   Number of children: 1   Years of education: 12   Highest education level: 12th grade  Occupational History   Occupation: disabled  Tobacco Use   Smoking  status: Former    Current packs/day: 1.00    Average packs/day: 1 pack/day for 44.0 years (44.0 ttl pk-yrs)    Types: E-cigarettes, Cigarettes   Smokeless tobacco: Former    Types: Snuff  Vaping Use   Vaping status: Former   Devices: 11/26/2018 "stopped months ago"  Substance and Sexual Activity   Alcohol use: Not Currently    Comment: occ   Drug use: Not Currently    Types: "Crack" cocaine, Marijuana, Oxycodone   Sexual activity: Not Currently  Other Topics Concern   Not on file  Social History Narrative   ** Merged History Encounter **       Lives in [place], in motel with sister.  They are looking to move but don't have a place to go yet.     Social Determinants of Health   Financial Resource Strain: Low Risk  (02/09/2023)   Overall Financial Resource Strain (CARDIA)    Difficulty of Paying Living Expenses: Not hard at all  Food Insecurity: No Food Insecurity (02/09/2023)   Hunger Vital Sign    Worried About Running Out of Food in the Last Year: Never true    Ran Out of Food in the Last Year: Never true  Transportation Needs: No Transportation Needs (02/09/2023)   PRAPARE - Administrator, Civil Service (Medical): No    Lack of Transportation (Non-Medical): No  Physical Activity: Inactive (02/09/2023)   Exercise Vital Sign    Days of Exercise per Week: 0 days    Minutes of Exercise per Session: 0 min  Stress: No Stress Concern Present (02/09/2023)   [employer] of Occupational Health - Occupational Stress Questionnaire    Feeling of Stress : Not at all  Social Connections: Not on file  Intimate Partner Violence: Not on file    Allergies  Allergen Reactions   Gabapentin Nausea And Vomiting and Other (See Comments)    POSSIBLE SHAKING   Lyrica [Pregabalin] Other (See Comments)    Shaking     Current Facility-Administered Medications  Medication Dose Route Frequency Provider Last Rate Last Admin   0.9 %  sodium chloride infusion   Intravenous Continuous  Leonie Douglas, MD       Heparin (Porcine) in NaCl  1000-0.9 UT/500ML-% SOLN    PRN Leonie Douglas, MD   500 mL at 08/11/23 0901    PHYSICAL EXAM Vitals:   08/11/23 0746  BP: (!) 144/59  Pulse: (!) 51  Temp: 97.8 F (36.6 C)  TempSrc: Oral  SpO2: 93%    Chronically unwell woman.  Obese. Left dorsalis pedis pulse weakly palpable Left fifth toe surgically absent No palpable pulses in right foot Well-healed right transmetatarsal amputation Right first toe plantar ulceration  PERTINENT LABORATORY AND RADIOLOGIC DATA  Most recent CBC    Latest Ref Rng & Units 08/11/2023    8:07 AM 08/03/2023   10:14 AM 08/02/2023    9:45 AM  CBC  WBC 4.0 - 10.5 K/uL  13.5  14.0   Hemoglobin 12.0 - 15.0 g/dL 54.0  7.9  8.2   Hematocrit 36.0 - 46.0 % 30.0  25.6  26.7   Platelets 150 - 400 K/uL  325  291      Most recent CMP    Latest Ref Rng & Units 08/11/2023    8:07 AM 08/02/2023    9:45 AM 08/01/2023    8:20 AM  CMP  Glucose 70 - 99 mg/dL 981  191  478   BUN 6 - 20 mg/dL 295  51  50   Creatinine 0.44 - 1.00 mg/dL 6.21  3.08  6.57   Sodium 135 - 145 mmol/L 136  137  135   Potassium 3.5 - 5.1 mmol/L 4.0  3.8  3.8   Chloride 98 - 111 mmol/L 94  105  104   CO2 22 - 32 mmol/L  22  18   Calcium 8.9 - 10.3 mg/dL  8.5  8.1     Renal function Estimated Creatinine Clearance: 37.6 mL/min (A) (by C-G formula based on SCr of 1.9 mg/dL (H)).  Hemoglobin A1C (no units)  Date Value  06/21/2017 9.8   HbA1c, POC (controlled diabetic range) (%)  Date Value  09/13/2022 9.6 (A)   Hgb A1c MFr Bld (%)  Date Value  07/27/2023 11.1 (H)    LDL Cholesterol  Date Value Ref Range Status  11/29/2022 46 0 - 99 mg/dL Final    Comment:           Total Cholesterol/HDL:CHD Risk Coronary Heart Disease Risk Table                     Men   Women  1/2 Average Risk   3.4   3.3  Average Risk       5.0   4.4  2 X Average Risk   9.6   7.1  3 X Average Risk  23.4   11.0        Use the calculated  Patient Ratio above and the CHD Risk Table to determine the patient's CHD Risk.        ATP III CLASSIFICATION (LDL):  <100     mg/dL   Optimal  846-962  mg/dL   Near or Above                    Optimal  130-159  mg/dL   Borderline  952-841  mg/dL   High  >324     mg/dL   Very High Performed at [facility], [address], [place], [place] [address]    Direct LDL  Date Value Ref Range Status  04/04/2022 90.8 0 - 99 mg/dL Final    Comment:  Performed at [facility], [address] [place], [place] [address]     +-------+-----------+-----------+------------+------------+  ABI/TBIToday's ABIToday's TBIPrevious ABIPrevious TBI  +-------+-----------+-----------+------------+------------+  Right .57        amputation .69         amputation    +-------+-----------+-----------+------------+------------+  Left  1.04       .61        .94         1.0           +-------+-----------+-----------+------------+------------+   Rande Brunt. Lenell Antu, MD FACS [facility] Phone Number: [phone] 08/11/2023 9:07 AM   Total time spent on preparing this encounter including chart review, data review, collecting history, examining the patient, coordinating care for this established patient, 30 minutes.  Portions of this report may have been transcribed using voice recognition software.  Every effort has been made to ensure accuracy; however, inadvertent computerized transcription errors may still be present.

## 2023-08-11 NOTE — Progress Notes (Signed)
Pt unable to walk long distance, bed exercises at bedside x 3 with no oozing from left groin site.

## 2023-08-14 ENCOUNTER — Other Ambulatory Visit (HOSPITAL_COMMUNITY): Payer: Self-pay

## 2023-08-14 ENCOUNTER — Encounter (HOSPITAL_COMMUNITY): Payer: Self-pay | Admitting: Vascular Surgery

## 2023-08-14 NOTE — Progress Notes (Signed)
Tried calling pt several times but it was going straight to [facility].  I did drive by there but nobody appeared to be home, nobody came to door.   I finally got in touch with her later in the day but had to sch her for tomor.   Kerry Hough, EMT-Paramedic  [phone] 08/14/2023

## 2023-08-15 ENCOUNTER — Other Ambulatory Visit (HOSPITAL_COMMUNITY): Payer: Self-pay
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
## 2023-08-15 NOTE — Progress Notes (Incomplete)
Patient ID: [name], female   DOB: 06/10/1961, 62 y.o.   MRN: [id number]    Advanced Heart Failure Clinic Note   PCP: [facility] PV: Dr. Allyson Sabal HF Cardiology: Dr. Shirlee Latch  [name] is a 62 y.o. with history of PAD, carotid stenosis s/p left CEA, relatively mild CAD, chronic systolic CHF, paroxysmal atrial fibrillation and prior substance abuse presents for CHF clinic followup.  She was admitted in 1/17 with acute hypoxemic respiratory failure in the setting of atrial fibrillation/RVR and volume overload.  She was initially intubated.  She converted back to NSR with amiodarone gtt.  She was treated with IV Lasix, steroids, bronchodilators.  She developed AKI and losartan was stopped.    She was admitted in 3/17 with symptomatic bradycardia.  She was supposed to stop diltiazem after this admission but continued to take it.  She presented later in 3/17, again with symptomatic bradycardia (junctional bradycardia), hypotension, and AKI.  Diltiazem and bisoprolol were stopped.  HR recovered and she has had no problems since.  ACEI was stopped with AKI.   In 5/18, she had peripheral angiography with atherectomy of right SFA.  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 7/18 ABIs showed 0.65 on right, 0.31 on left.  In 8/20, she had peripheral angiography showing totally occluded left SFA.  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 Arbie Cookey.  ABIs in 2/21 were 0.44 on right, 0.99 on left.   Echo in 1/21 showed EF 50%, mild LVH, normal RV, PASP 60 mmHg.   Echo 03/08/21 EF 55-60% with basal to mid inferolateral hypokinesis.   Admitted 6/22 for mechanical fall, found to have AKI on CKD. She was given IV lasix and her losartan was held.  Underwent PV angiogram with Dr. Allyson Sabal 7/22. She had drug-coated balloon angioplasty of the right SFA after being admitted for critical ischemia of the right foot.  Repeat peripheral  arterial dopplers in 8/22 still showed severe right SFA disease.   Admitted 8/22 for bradycardia and AKI, bisoprolol subsequently stopped; amiodarone was continued.   Follow up 1/24, she was volume overloaded and instructed to take metolazone 2.5 mg x 1 and increase torsemide to 100 bid.  Today she returns for HF follow up. Complaining of leg pain.  Frustrated because her meds are not right. Plans to change insurance next month. Limited by leg pain. Occasionally short of breath. Denies ND/Orthopnea. Appetite ok. No fever or chills. Weight at home not accurate. She is out of her spiro, hydralazin, eliquis, cymbalta, wellbutrin. Taking all medications. Smoking 1/2 PPD. Lives with her sister and nephew.   Cardiomems: PAD 17, goal 20   Labs (1/17): K 5.2, creatinine 1.4, AST 43, ALT normal Labs (2/17): K 4.2, creatinine 1.3, BNP 607, TSH normal Labs (3/17): K 4.2, creatinine 1.45, AST/ALT normal, HCT 28.7 Labs (4/17): K 4.4, creatinine 1.61 Labs (08/31/2016): K 4.5 Creatinine 1.43  Labs (11/17): K 4.2, creatinine 1.4 Labs (9/18): K 4.1, creatinine 1.1, TSH normal Labs 04/13/2018: K 5 Creatinine 1.75 Labs (2/21): K 4.1, creatinine 1.58 Labs (4/22): K 3.7, creatinine 1.55 Labs (5/22): K 3.8, creatinine 1.54 Labs (8/22): K 4.0, creatinine 2.07 Labs (11/22): TSH normal, LDL 59 Labs (12/22): K 3.6, creatinine 1.28, BNP 256, AST/ALT normal Labs (3/23): K 4.3, creatinine 1.82 => 1.7, hgb 11 Labs (5/22) K 3.6, creatinine 2.2, TG 405  Labs (6/22) K 3.6, creatinine 1.34  Labs (8/23):  K 4.7, creatinine 2.11, LDL 93, TGs 315 Labs (10/23): K 3.4, creatinine 1.35 Labs (1/24): K 4.0, creatinine 3.58  PMH: 1. Carotid stenosis: Known occluded right carotid.  Left CEA in 4/16.  - Carotid dopplers (7/21): CTO RICA, mild disease LICA.  - Carotid dopplers (7/22): CTO RICA, 1-39% LICA.  - Carotid dopplers (3/23): Totally occluded RICA, 1-39% LICA.  2. CAD: LHC in 12/15 with 80% stenosis in small OM1,  nonobstructive disease in other territories.  3. Chronic systolic CHF: Nonischemic cardiomyopathy (?due to ETOH or prior drug abuse).  She has a Cardiomems.  - Echo (1/17) with EF 45%, mild LV hypertrophy, moderate diastolic dysfunction, inferolateral severe hypokinesis, mildly decreased RV systolic function.  - Echo (1/18): EF 40-45%, mild LVH, normal RV size and systolic function.  - Echo (1/21): EF 50%, mild LVH, normal RV, PASP 60 mmHg.  - Echo (4/22): EF 55-60%, basal-mid inferolateral hypokinesis with grade II diastolic dysfunction, RV normal, PASP 42 -Echo (02/2023): EF 50-55%.  4. Atrial fibrillation: Paroxysmal.   5. Type II diabetes 6. CKD: Stage III. 7. COPD: Smokes 1 ppd.  8. 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  No longer drinks.  9. Hypothyroidism 10. PAD: Atherectomy SFA in 2014 (Dr Allyson Sabal).  Peripheral arterial dopplers (2/17) with focal 75-99% proximal right SFA stenosis, occluded mid-distal right SFA, chronic occlusion of all runoff arteries on right. - In 5/18, she had peripheral angiography with atherectomy of right SFA.   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  - 7/18 ABIs showed 0.65 on right, 0.31 on left.   - Peripheral angiography 8/20: Totally occluded left SFA.  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 Arbie Cookey.   - ABIs (2/21): 0.44 R, 0.99 L.  - Balloon angioplasty right SFA 7/22.  - ABIs (8/22): severe right SFA disease.  - peripheral arterial dopplers (2/23): 50-74% right SFA and right popliteal. Stable.  11. Anemia 12. Prior cocaine abuse.  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  14. OSA: Has not been able to tolerate CPAP.  15. Diabetic neuropathy.   SH: Lives with sister.  Prior cocaine abuse.  Prior ETOH abuse.  Quit smoking in 1/17, restarted, and quit again in 4/19, restarted again.  FH: CAD  Review of systems complete and found to be  negative unless listed in HPI.    Current Outpatient Medications  Medication Sig Dispense Refill   ACCU-CHEK GUIDE test strip USE TO CHECK BLOOD SUGAR FOUR TIMES DAILY     acetaminophen (TYLENOL) 500 MG tablet Take 1,000 mg by mouth every 6 (six) hours as needed for headache (pain).     albuterol (VENTOLIN HFA) 108 (90 Base) MCG/ACT inhaler USE 2 PUFFS BY MOUTH EVERY FOUR HOURS, AS NEEDED, FOR COUGHING/WHEEZING 8.5 g 0   apixaban (ELIQUIS) 5 MG TABS tablet Take 1 tablet (5 mg total) by mouth 2 (two) times daily. 60 tablet 6   budesonide-formoterol (SYMBICORT) 160-4.5 MCG/ACT inhaler Inhale 2 puffs into the lungs 2 (two) times daily. 1 each 2   buPROPion ER (WELLBUTRIN SR) 100 MG 12 hr tablet Take 1 tablet (100 mg total) by mouth daily. 30 tablet 6   Cholecalciferol (VITAMIN D3 PO) Take 1 capsule by mouth every morning.     colchicine 0.6 MG tablet Take 1.2 mg by mouth See admin instructions. Take 1.2 mg for flair up and an hour later take 0.6 mg if  needed (Patient not taking: Reported on 08/07/2023)     EASY COMFORT PEN NEEDLES 31G X 5 MM MISC USE FOUR TIMES A DAY FOR INSULIN ADMINISTRATION 100 each 1   insulin lispro (HUMALOG) 100 UNIT/ML KwikPen Inject 10 Units into the skin 3 (three) times daily with meals. 15 mL 0   JARDIANCE 10 MG TABS tablet TAKE 1 TABLET (10 MG TOTAL) BY MOUTH DAILY BEFORE BREAKFAST (AM) 90 tablet 3   LANTUS SOLOSTAR 100 UNIT/ML Solostar Pen Inject 37 Units into the skin daily. (Patient taking differently: Inject 38 Units into the skin daily.) 15 mL 0   levothyroxine (SYNTHROID) 50 MCG tablet TAKE 1 TABLET (50 MCG TOTAL) BY MOUTH DAILY BEFORE BREAKFAST (AM) 30 tablet 0   losartan (COZAAR) 25 MG tablet TAKE 2 TABLETS (50 MG TOTAL) BY MOUTH DAILY (AM) 60 tablet 0   metoprolol tartrate (LOPRESSOR) 25 MG tablet Take 0.5 tablets (12.5 mg total) by mouth 2 (two) times daily. 60 tablet 1   nystatin cream (MYCOSTATIN) Apply topically 2 (two) times daily. 30 g 0   Omega-3 Fatty  Acids (FISH OIL PO) Take 1 capsule by mouth every morning.     pantoprazole (PROTONIX) 40 MG tablet TAKE 1 TABLET (40 MG TOTAL) BY MOUTH DAILY(AM) 30 tablet 0   polyethylene glycol (MIRALAX / GLYCOLAX) 17 g packet Take 17 g by mouth daily as needed for moderate constipation. 14 each 0   potassium chloride SA (KLOR-CON M) 20 MEQ tablet Take 1 tablet (20 mEq total) by mouth 2 (two) times daily. 180 tablet 3   rosuvastatin (CRESTOR) 40 MG tablet Take 1 tablet (40 mg total) by mouth daily. 90 tablet 3   spironolactone (ALDACTONE) 25 MG tablet TAKE 0.5 TABLET (12.5 MG TOTAL) BY MOUTH DAILY (AM) 45 tablet 3   torsemide (DEMADEX) 100 MG tablet TAKE 1 TABLET (100 MG TOTAL) BY MOUTH 2 (TWO) TIMES DAILY. (AM+BEDTIME) 60 tablet 3   triamcinolone ointment (KENALOG) 0.1 % Apply topically 2 (two) times daily. 454 g 0   VITAMIN E PO Take 1 tablet by mouth daily.     No current facility-administered medications for this visit.   LMP  (LMP Unknown)  There were no vitals filed for this visit.    Wt Readings from Last 3 Encounters:  08/03/23 107.2 kg (236 lb 5.3 oz)  07/11/23 95.7 kg (211 lb)  07/05/23 99.3 kg (219 lb)  General:  Arrived in a wheel chair. No resp difficulty. Tearful  HEENT: normal Neck: supple. no JVD. Carotids 2+ bilat; no bruits. No lymphadenopathy or thryomegaly appreciated. Cor: PMI nondisplaced. Regular rate & rhythm. No rubs, gallops or murmurs. Lungs: clear Abdomen: soft, nontender, nondistended. No hepatosplenomegaly. No bruits or masses. Good bowel sounds. Extremities: no cyanosis, clubbing, rash, R and LLE 1+ edema. R and L foot  orthotic shoe.  Neuro: alert & orientedx3, cranial nerves grossly intact. moves all 4 extremities w/o difficulty. Affect pleasant   Assessment/Plan: 1. Chronic diastolic CHF: LHC in 12/15 showing only 80% stenosis in small OM1. EF as low as 40-45% in the past. Echo 4/22 showed EF 55-60% with grade 2 diastolic dysfunction.   Echo 02/2023 EF 50-55%   NYHA II.  Cardiomems Reading 17 which is stable.  - Volume status mildly  elevated, suspect due to the food she is eating. Discussed low salt food choices.  Continue torsemide 100 mg twice a day.  - Not taking spiro.,Check BMET  - Add hydralazine 50 mg twice a day.   -  Continue losartan 50 mg daily.  - Continue Jardiance 10 mg daily. No GU symptoms. - Off bisoprolol due to bradycardia.   - Check BMET  2. Atrial fibrillation: Paroxysmal. - Regular on exam.  - No longer amio. Restart elqiuis 5 mg twice a day.  3. PAD: Claudication bilaterally.  Not candidate for cilostazol with CHF.  She has had a left fem-pop bypass in 8/20.  ABIs in 2/21 were very abnormal on right. She underwent PV angiogram with balloon angioplasty of the right SFA 7/22.  8/22 dopplers still showed severe right SFA disease. Dopplers 2/23 showed patent right SFA.  Peripheral arterial dopplers in 2/23 showed 50-74% stenosis right SFA and popliteal.  She has no definite claudication, has knee pain and neuropathic pain in feet. Now with new ulcer on RLE. She is due for repeat ABIs. She is followed by Dr. Allyson Sabal. - [facility] - Continue rosuvastatin.  4. COPD:  She has restarted smoking. Restart Wellbutrin 100 mg daily to help with cessation. . Discussed smoking cessation.    5. H/o cirrhosis: Likely from ETOH, she has quit.  6. OSA: Not able to tolerate CPAP.   7. CKD stage III:  Baseline SCr ~1.5 - Check BMET  8. HTN: Uncontrolled.  Restarting hydralazine 50 mg twice a day.  9.Type 2 diabetes: Per PCP.   10. CAD: LHC in 12/15 with 80% stenosis in small OM1, nonobstructive disease in other territories. No chest pain. - Continue rosuvastatin.  - Continue Eliquis. 11. Carotid stenosis: Stable in 3/23.   12. Hyperlipidemia: She is on Crestor and Vascepa.  - LDL 46 (1/24)    Check BMET. Restarting hydralazine, eliquis, and wellbutrin. Multiple medications issues. Needs to get back on track. Refer to HF  Paramedicine.   Follow up in 6 weeks with APP. 3 months with Dr Shirlee Latch   Greater than 50% of the (total minutes 30) visit spent in counseling/coordination of care regarding ( details of discussion medications diet.)    Jacklynn Ganong, FNP 08/15/2023

## 2023-08-16 ENCOUNTER — Other Ambulatory Visit (HOSPITAL_COMMUNITY): Payer: Self-pay

## 2023-08-16 ENCOUNTER — Telehealth: Payer: Self-pay

## 2023-08-16 ENCOUNTER — Telehealth (HOSPITAL_COMMUNITY): Payer: Self-pay

## 2023-08-16 ENCOUNTER — Ambulatory Visit: Payer: Self-pay | Admitting: *Deleted

## 2023-08-16 ENCOUNTER — Encounter (HOSPITAL_COMMUNITY): Payer: Self-pay

## 2023-08-16 ENCOUNTER — Telehealth (HOSPITAL_COMMUNITY): Payer: Self-pay | Admitting: Cardiology

## 2023-08-16 ENCOUNTER — Ambulatory Visit (HOSPITAL_COMMUNITY)
Admission: RE | Admit: 2023-08-16 | Discharge: 2023-08-16 | Disposition: A | Discharge status: Home / Self Care | Payer: 59 | Source: Ambulatory Visit | Attending: Family Medicine | Admitting: Family Medicine

## 2023-08-16 VITALS — BP 120/60 | HR 68 | Wt 215.0 lb

## 2023-08-16 DIAGNOSIS — Z95820 Peripheral vascular angioplasty status with implants and grafts: Secondary | ICD-10-CM | POA: Diagnosis not present

## 2023-08-16 DIAGNOSIS — Z794 Long term (current) use of insulin: Secondary | ICD-10-CM | POA: Diagnosis not present

## 2023-08-16 DIAGNOSIS — I739 Peripheral vascular disease, unspecified: Secondary | ICD-10-CM

## 2023-08-16 DIAGNOSIS — Z7901 Long term (current) use of anticoagulants: Secondary | ICD-10-CM | POA: Insufficient documentation

## 2023-08-16 DIAGNOSIS — N183 Chronic kidney disease, stage 3 unspecified: Secondary | ICD-10-CM | POA: Insufficient documentation

## 2023-08-16 DIAGNOSIS — I480 Paroxysmal atrial fibrillation: Secondary | ICD-10-CM | POA: Diagnosis not present

## 2023-08-16 DIAGNOSIS — E1122 Type 2 diabetes mellitus with diabetic chronic kidney disease: Secondary | ICD-10-CM | POA: Diagnosis not present

## 2023-08-16 DIAGNOSIS — I130 Hypertensive heart and chronic kidney disease with heart failure and stage 1 through stage 4 chronic kidney disease, or unspecified chronic kidney disease: Secondary | ICD-10-CM | POA: Diagnosis not present

## 2023-08-16 DIAGNOSIS — I6529 Occlusion and stenosis of unspecified carotid artery: Secondary | ICD-10-CM

## 2023-08-16 DIAGNOSIS — N1831 Chronic kidney disease, stage 3a: Secondary | ICD-10-CM

## 2023-08-16 DIAGNOSIS — L97919 Non-pressure chronic ulcer of unspecified part of right lower leg with unspecified severity: Secondary | ICD-10-CM | POA: Diagnosis not present

## 2023-08-16 DIAGNOSIS — Z79899 Other long term (current) drug therapy: Secondary | ICD-10-CM | POA: Diagnosis not present

## 2023-08-16 DIAGNOSIS — F17210 Nicotine dependence, cigarettes, uncomplicated: Secondary | ICD-10-CM | POA: Insufficient documentation

## 2023-08-16 DIAGNOSIS — Z8719 Personal history of other diseases of the digestive system: Secondary | ICD-10-CM

## 2023-08-16 DIAGNOSIS — I5032 Chronic diastolic (congestive) heart failure: Secondary | ICD-10-CM

## 2023-08-16 DIAGNOSIS — I2510 Atherosclerotic heart disease of native coronary artery without angina pectoris: Secondary | ICD-10-CM | POA: Diagnosis not present

## 2023-08-16 DIAGNOSIS — E782 Mixed hyperlipidemia: Secondary | ICD-10-CM

## 2023-08-16 DIAGNOSIS — I10 Essential (primary) hypertension: Secondary | ICD-10-CM

## 2023-08-16 DIAGNOSIS — J449 Chronic obstructive pulmonary disease, unspecified: Secondary | ICD-10-CM

## 2023-08-16 DIAGNOSIS — E1151 Type 2 diabetes mellitus with diabetic peripheral angiopathy without gangrene: Secondary | ICD-10-CM | POA: Diagnosis not present

## 2023-08-16 DIAGNOSIS — Z7984 Long term (current) use of oral hypoglycemic drugs: Secondary | ICD-10-CM | POA: Diagnosis not present

## 2023-08-16 DIAGNOSIS — E785 Hyperlipidemia, unspecified: Secondary | ICD-10-CM | POA: Diagnosis not present

## 2023-08-16 DIAGNOSIS — G4733 Obstructive sleep apnea (adult) (pediatric): Secondary | ICD-10-CM | POA: Diagnosis not present

## 2023-08-16 LAB — BASIC METABOLIC PANEL
Anion gap: 10 (ref 5–15)
BUN: 60 mg/dL — ABNORMAL HIGH (ref 6–20)
CO2: 24 mmol/L (ref 22–32)
Calcium: 8.3 mg/dL — ABNORMAL LOW (ref 8.9–10.3)
Chloride: 100 mmol/L (ref 98–111)
Creatinine, Ser: 2.37 mg/dL — ABNORMAL HIGH (ref 0.44–1.00)
GFR, Estimated: 23 mL/min — ABNORMAL LOW (ref >60)
Glucose, Bld: 421 mg/dL — ABNORMAL HIGH (ref 70–99)
Potassium: 5.9 mmol/L — ABNORMAL HIGH (ref 3.5–5.1)
Sodium: 134 mmol/L — ABNORMAL LOW (ref 135–145)

## 2023-08-16 LAB — CBC
HCT: 32.7 % — ABNORMAL LOW (ref 36.0–46.0)
Hemoglobin: 9.8 g/dL — ABNORMAL LOW (ref 12.0–15.0)
MCH: 27.6 pg (ref 26.0–34.0)
MCHC: 30 g/dL (ref 30.0–36.0)
MCV: 92.1 fL (ref 80.0–100.0)
Platelets: 319 10*3/uL (ref 150–400)
RBC: 3.55 MIL/uL — ABNORMAL LOW (ref 3.87–5.11)
RDW: 15.4 % (ref 11.5–15.5)
WBC: 12.2 10*3/uL — ABNORMAL HIGH (ref 4.0–10.5)
nRBC: 0 % (ref 0.0–0.2)

## 2023-08-16 LAB — BRAIN NATRIURETIC PEPTIDE: B Natriuretic Peptide: 243 pg/mL — ABNORMAL HIGH (ref 0.0–100.0)

## 2023-08-16 MED ORDER — METOLAZONE 2.5 MG PO TABS: ORAL_TABLET | ORAL | 0 refills | Status: DC

## 2023-08-16 NOTE — Telephone Encounter (Signed)
  Chief Complaint: possible medication interaction between spironolactone (ALDACTONE) 25 MG tablet and potassium chloride SA (KLOR-CON M) 20 MEQ tablet per [facility] nurse Gretchen.  Symptoms: denies sx  Frequency: na Pertinent Negatives: Patient denies na Disposition: [] ED /[] Urgent Care (no appt availability in office) / [] Appointment(In office/virtual)/ []  [facility]/ [] Home Care/ [] Refused Recommended Disposition /[] [facility]/ [x]  Follow-up with PCP Additional Notes:   Please advise [facility] nurse not with patient now but reports medications may have possible contraindications taking together. Medications prescribed by Dr. Marca Ancona- cardiology.  Please advise.        Reason for Disposition  [1] Caller has URGENT medicine question about med that PCP or specialist prescribed AND [2] triager unable to answer question  Answer Assessment - Initial Assessment Questions 1. NAME of MEDICINE: "What medicine(s) are you calling about?" potassium chloride SA (KLOR-CON M) 20 MEQ tablet, and spironolactone (ALDACTONE) 25 MG tablet 2. QUESTION: "What is your question?" (e.g., double dose of medicine, side effect)     HH nurse reports possible drug interaction while taking medications together. Do you want to continue? 3. PRESCRIBER: "Who prescribed the medicine?" Reason: if prescribed by specialist, call should be referred to that group.     Dr. Marca Ancona- cardiology 4. SYMPTOMS: "Do you have any symptoms?" If Yes, ask: "What symptoms are you having?"  "How bad are the symptoms (e.g., mild, moderate, severe)     No has been taking medications no new  5. PREGNANCY:  "Is there any chance that you are pregnant?" "When was your last menstrual period?"     na  Protocols used: Medication Question Call-A-AH

## 2023-08-16 NOTE — Telephone Encounter (Signed)
Patient called.  Patient aware.  

## 2023-08-16 NOTE — Telephone Encounter (Signed)
Call placed to patient unable to reach message left on VM. Call to advise patient of the following per pharmacist    There is no contraindication between potassium and spironolactone so long as pt's serum potassium levels are normal. Her K level in her chart is 4.0 which is nl. She can take both of these together with no issue. And her Cardiologist is monitoring.

## 2023-08-16 NOTE — Patient Instructions (Signed)
Medication Changes:  STOP: JARDIANCE   START: METOLAZONE 2.5MG  (TAKE ONE DOSE TOMORROW 10/3 WITH YOUR TORSEMIDE) THEN KEEP OTHER TABLETS TO BE USED AT DISCRETION OF HEART FAILURE CLINIC  TAKE AN EXTRA OF POTASSIUM TOMORROW WITH METOLAZONE DOSE   Lab Work:  Labs done today, your results will be available in MyChart, we will contact you for abnormal readings.  Special Instructions // Education:  PLEASE SCHEDULE FOLLOW UP WITH PCP (PRIMARY CARE)  Follow-Up in: AS SCHEDULED WITH DR. Shirlee Latch   At the Advanced Heart Failure Clinic, you and your health needs are our priority. We have a designated team specialized in the treatment of Heart Failure. This Care Team includes your primary Heart Failure Specialized Cardiologist (physician), Advanced Practice Providers (APPs- Physician Assistants and Nurse Practitioners), and Pharmacist who all work together to provide you with the care you need, when you need it.   You may see any of the following providers on your designated Care Team at your next follow up:  Dr. Arvilla Meres Dr. Marca Ancona Dr. Dorthula Nettles Dr. Theresia Bough Tonye Becket, NP Robbie Lis, [place] [facility] [place], [place] Brynda Peon, NP [place] Lee, NP Karle Plumber, PharmD   Please be sure to bring in all your medications bottles to every appointment.   Need to Contact us:  If you have any questions or concerns before your next appointment please send [place] a message through [place] or call our office at [phone].    TO LEAVE A MESSAGE FOR THE NURSE SELECT OPTION 2, PLEASE LEAVE A MESSAGE INCLUDING: YOUR NAME DATE OF BIRTH CALL BACK NUMBER REASON FOR CALL**this is important as we prioritize the call backs  YOU WILL RECEIVE A CALL BACK THE SAME DAY AS LONG AS YOU CALL BEFORE 4:00 PM

## 2023-08-16 NOTE — Addendum Note (Signed)
Encounter addended by: Jacklynn Ganong, FNP on: 08/16/2023 3:25 PM  Actions taken: Clinical Note Signed

## 2023-08-16 NOTE — Telephone Encounter (Signed)
Attempted to call patient sister whom she lives with as well- unable to reach.   Left message to call back .

## 2023-08-16 NOTE — Telephone Encounter (Signed)
Copied from CRM [id number]. Topic: Quick Communication - Home Health Verbal Orders >> Aug 16, 2023  8:31 AM Everette C wrote: Caller/Agency: Vinnie Langton / Iantha Fallen  Callback Number: [phone] Requesting OT/PT/Skilled Nursing/Social Work/Speech Therapy: PT Frequency: 2w2 1w4  Requesting OT/PT/Skilled Nursing/Social Work/Speech Therapy: skilled nursing  Frequency: evaluation

## 2023-08-16 NOTE — Telephone Encounter (Signed)
-----   Message from Jacklynn Ganong sent at 08/16/2023  3:51 PM EDT ----- K is too high. Stop spiro and any KCL supplement.  Take Lokelma 5 g x 1 today (1/2 packet).  Repeat BMET on Friday or Monday

## 2023-08-16 NOTE — Progress Notes (Unsigned)
Paramedicine Encounter   Patient ID: [name] , female,   DOB: 1961-05-13,61 y.o.,  MRN: [id number]   Met patient in clinic today with provider.  Weight @ clinic-215 B/P-120/60 P-68 [id number] Cardiomems-14 today--goal 20   Her exam shows fluid on her today.   Med changes-STOP jardiance  Dose of metolazone tomor am with extra of K.  Can wait until tomor since its late in the day.   EKG done today-   Will reach back out to [name] at [facility] to get her seen there asap.  She would need referral to endo also.  Sent message to [place] ref CHW appoint.    Kerry Hough, EMT-Paramedic [phone] 08/16/2023

## 2023-08-16 NOTE — Progress Notes (Signed)
Med rec today post clinic visit.   Stop jardiance- Take metolazone tomor with torsemide AM dose.   Also critical labs abnormal- Stop K Stop spiro    Meds adjusted in pill box.   Tale 5mg  of lokelma--I p/u from clinic to take to her.  She took it while I was there.    She has f/u labs on Monday.  Will see her again on Tuesday.    Kerry Hough, EMT-Paramedic  [phone] 08/16/2023

## 2023-08-16 NOTE — Telephone Encounter (Signed)
Attempted to call patient, left message for patient to call back to office.    [place], Anderson Malta, FNP  Carollynn Pennywell, Cleotilde Neer, RN K is too high. Stop spiro and any KCL supplement.  Take Lokelma 5 g x 1 today (1/2 packet).  Repeat BMET on Friday or Monday

## 2023-08-17 ENCOUNTER — Telehealth (HOSPITAL_COMMUNITY): Payer: Self-pay | Admitting: Licensed Clinical Social Worker

## 2023-08-17 NOTE — Telephone Encounter (Signed)
CSW informed by [employer] that pt received a letter that they will have to pay for $175 medicare premium out of pocket.  Pt only makes around $1,100 so should not be responsible for this amount.  She had [employer]HC Dual Complete which means she should have medicare and medicaid coverage- I believe she likely missed recertification for medicaid and needs to apply ASAP.  CSW attempted to call pt to discuss but unable to reach- left VM requesting return call.  Burna Sis, LCSW Clinical Social Worker Advanced Heart Failure Clinic Desk#: [phone] Cell#: [phone]

## 2023-08-17 NOTE — Telephone Encounter (Signed)
LVM for return call for verbal orders

## 2023-08-18 ENCOUNTER — Telehealth: Payer: Self-pay | Admitting: Family Medicine

## 2023-08-18 ENCOUNTER — Other Ambulatory Visit (HOSPITAL_COMMUNITY): Payer: Self-pay

## 2023-08-18 DIAGNOSIS — I5042 Chronic combined systolic (congestive) and diastolic (congestive) heart failure: Secondary | ICD-10-CM

## 2023-08-18 MED ORDER — ICOSAPENT ETHYL 1 G PO CAPS: 2.0000 g | ORAL_CAPSULE | Freq: Two times a day (BID) | ORAL | 6 refills | Status: DC

## 2023-08-18 NOTE — Telephone Encounter (Signed)
Call to speak with Will OT with [facility]. Verbal  order to move OT eval to next week

## 2023-08-18 NOTE — Telephone Encounter (Signed)
Home Health Verbal Orders - Caller/Agency:  Will, OT with [facility]   Callback Number: [phone]  Requesting OT  Frequency: Wants to move OT evaluation to next week because patient was not available this week.

## 2023-08-21 ENCOUNTER — Ambulatory Visit (HOSPITAL_COMMUNITY)
Admission: RE | Admit: 2023-08-21 | Discharge: 2023-08-21 | Disposition: A | Discharge status: Home / Self Care | Payer: 59 | Source: Ambulatory Visit | Attending: Family Medicine | Admitting: Family Medicine

## 2023-08-21 ENCOUNTER — Telehealth: Payer: Self-pay | Admitting: Family Medicine

## 2023-08-21 DIAGNOSIS — I5032 Chronic diastolic (congestive) heart failure: Secondary | ICD-10-CM | POA: Diagnosis present

## 2023-08-21 LAB — BASIC METABOLIC PANEL
Anion gap: 14 (ref 5–15)
BUN: 66 mg/dL — ABNORMAL HIGH (ref 6–20)
CO2: 23 mmol/L (ref 22–32)
Calcium: 8.3 mg/dL — ABNORMAL LOW (ref 8.9–10.3)
Chloride: 96 mmol/L — ABNORMAL LOW (ref 98–111)
Creatinine, Ser: 2.09 mg/dL — ABNORMAL HIGH (ref 0.44–1.00)
GFR, Estimated: 27 mL/min — ABNORMAL LOW (ref >60)
Glucose, Bld: 464 mg/dL — ABNORMAL HIGH (ref 70–99)
Potassium: 4.2 mmol/L (ref 3.5–5.1)
Sodium: 133 mmol/L — ABNORMAL LOW (ref 135–145)

## 2023-08-21 NOTE — Telephone Encounter (Signed)
Copied from CRM [id number]. Topic: General - Other >> Aug 21, 2023  3:44 PM Everette C wrote: Reason for CRM: MaryLou with [facility] would like clarity related to the duration and instructions for the patient's prescription of triamcinolone ointment (KENALOG) 0.1 % [[id number]]   Please contact further when possible at [phone] Rx Dept

## 2023-08-23 ENCOUNTER — Other Ambulatory Visit (HOSPITAL_COMMUNITY): Payer: Self-pay | Admitting: Cardiology

## 2023-08-23 ENCOUNTER — Other Ambulatory Visit (HOSPITAL_COMMUNITY): Payer: Self-pay

## 2023-08-23 ENCOUNTER — Encounter (HOSPITAL_BASED_OUTPATIENT_CLINIC_OR_DEPARTMENT_OTHER): Payer: 59 | Attending: Physician Assistant | Admitting: Physician Assistant

## 2023-08-23 ENCOUNTER — Telehealth: Payer: Self-pay | Admitting: Family Medicine

## 2023-08-23 ENCOUNTER — Telehealth (HOSPITAL_COMMUNITY): Payer: Self-pay | Admitting: Licensed Clinical Social Worker

## 2023-08-23 DIAGNOSIS — E669 Obesity, unspecified: Secondary | ICD-10-CM | POA: Diagnosis not present

## 2023-08-23 DIAGNOSIS — J449 Chronic obstructive pulmonary disease, unspecified: Secondary | ICD-10-CM | POA: Diagnosis not present

## 2023-08-23 DIAGNOSIS — E11622 Type 2 diabetes mellitus with other skin ulcer: Secondary | ICD-10-CM | POA: Insufficient documentation

## 2023-08-23 DIAGNOSIS — G4730 Sleep apnea, unspecified: Secondary | ICD-10-CM | POA: Insufficient documentation

## 2023-08-23 DIAGNOSIS — Z6838 Body mass index (BMI) 38.0-38.9, adult: Secondary | ICD-10-CM | POA: Insufficient documentation

## 2023-08-23 DIAGNOSIS — I5042 Chronic combined systolic (congestive) and diastolic (congestive) heart failure: Secondary | ICD-10-CM

## 2023-08-23 DIAGNOSIS — E1151 Type 2 diabetes mellitus with diabetic peripheral angiopathy without gangrene: Secondary | ICD-10-CM | POA: Insufficient documentation

## 2023-08-23 DIAGNOSIS — E1165 Type 2 diabetes mellitus with hyperglycemia: Secondary | ICD-10-CM | POA: Diagnosis not present

## 2023-08-23 DIAGNOSIS — I130 Hypertensive heart and chronic kidney disease with heart failure and stage 1 through stage 4 chronic kidney disease, or unspecified chronic kidney disease: Secondary | ICD-10-CM | POA: Insufficient documentation

## 2023-08-23 DIAGNOSIS — I509 Heart failure, unspecified: Secondary | ICD-10-CM | POA: Insufficient documentation

## 2023-08-23 DIAGNOSIS — L97812 Non-pressure chronic ulcer of other part of right lower leg with fat layer exposed: Secondary | ICD-10-CM | POA: Diagnosis not present

## 2023-08-23 DIAGNOSIS — L97512 Non-pressure chronic ulcer of other part of right foot with fat layer exposed: Secondary | ICD-10-CM | POA: Diagnosis not present

## 2023-08-23 DIAGNOSIS — E11621 Type 2 diabetes mellitus with foot ulcer: Secondary | ICD-10-CM | POA: Insufficient documentation

## 2023-08-23 DIAGNOSIS — I2510 Atherosclerotic heart disease of native coronary artery without angina pectoris: Secondary | ICD-10-CM | POA: Diagnosis not present

## 2023-08-23 DIAGNOSIS — E1122 Type 2 diabetes mellitus with diabetic chronic kidney disease: Secondary | ICD-10-CM | POA: Insufficient documentation

## 2023-08-23 DIAGNOSIS — I87331 Chronic venous hypertension (idiopathic) with ulcer and inflammation of right lower extremity: Secondary | ICD-10-CM | POA: Insufficient documentation

## 2023-08-23 DIAGNOSIS — N1832 Chronic kidney disease, stage 3b: Secondary | ICD-10-CM | POA: Insufficient documentation

## 2023-08-23 DIAGNOSIS — F17210 Nicotine dependence, cigarettes, uncomplicated: Secondary | ICD-10-CM | POA: Diagnosis not present

## 2023-08-23 DIAGNOSIS — I4891 Unspecified atrial fibrillation: Secondary | ICD-10-CM | POA: Insufficient documentation

## 2023-08-23 DIAGNOSIS — K7460 Unspecified cirrhosis of liver: Secondary | ICD-10-CM | POA: Diagnosis not present

## 2023-08-23 DIAGNOSIS — L97522 Non-pressure chronic ulcer of other part of left foot with fat layer exposed: Secondary | ICD-10-CM | POA: Diagnosis not present

## 2023-08-23 NOTE — Telephone Encounter (Signed)
This is a CHF pt 

## 2023-08-23 NOTE — Telephone Encounter (Signed)
HF Paramedicine Team Based Care Meeting  HF MD- NA  HF NP - [name] Clegg NP-C   [facility] HF Paramedicine  [name] Lynch Geraldine Solar  [name] Smith  [facility] admit within the last 30 days for heart failure? no  Medications concerns? Not capable of doing on pill boxes- can't read very reliably- but is being pretty compliant with meds.  Transportation issues?   Education needs?   SDOH   Eligible for discharge? Very recently re- enrolled in program due to noncompliance- needs close follow up currently to avoid readmission to hospital- might be getting amputation soon so very medically fragile.  Burna Sis, LCSW Clinical Social Worker Advanced Heart Failure Clinic Desk#: [phone] Cell#: [phone]

## 2023-08-23 NOTE — Progress Notes (Signed)
Entered By: Allen Derry on 08/23/2023 13:52:36 -------------------------------------------------------------------------------- SuperBill Details Patient Name: Date of Service: [name] 08/23/2023 Medical Record Number: [id number] Patient Account Number: [id number] Date of Birth/Sex: Treating RN: 07/04/1961 (62 y.o. 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 L97.522 Non-pressure chronic ulcer of other part of left foot with fat layer exposed E11.622 Type 2 diabetes mellitus with other skin ulcer L97.812 Non-pressure chronic ulcer of other part of right lower leg with fat layer exposed I73.89 Other specified peripheral vascular diseases I87.331 Chronic venous hypertension (idiopathic) with ulcer and inflammation of right lower extremity Facility Procedures : CPT4 Code: 46962952 Description: 99214 - WOUND CARE VISIT-LEV 4 EST PT Modifier: Quantity: 1 Physician Procedures : CPT4 Code Description Modifier [id number] 99213 - WC PHYS LEVEL 3 - EST PT ICD-10 Diagnosis Description 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 L97.522  Non-pressure chronic ulcer of other part of left foot with fat layer exposed E11.622 Type 2 diabetes mellitus with other skin ulcer Quantity: 1 Electronic Signature(s) Signed: 08/23/2023 4:54:15 PM By: Allen Derry PA-C Previous Signature: 08/23/2023 4:43:13 PM Version By: Allen Derry PA-C Entered By: Allen Derry on 08/23/2023 13:54:15  [name], [name] ([id number]) [phone].pdf Page 1 of 12 Visit Report for 08/23/2023 Chief Complaint Document Details Patient Name: Date of Service: [name] 08/23/2023 2:45 PM Medical Record Number: [id number] Patient Account Number: [id number] Date of Birth/Sex: Treating RN: [birth year] (62 y.o. F) Primary Care Provider: [name] Other Clinician: Referring Provider: Treating Provider/Extender: [name], [name] Weeks in Treatment: 18 Information Obtained from: Patient Chief Complaint Bilateral LE Ulcers Electronic Signature(s) Signed: 08/23/2023 3:37:49 PM By: Allen Derry PA-C Entered By: Allen Derry on 08/23/2023 12:37:48 -------------------------------------------------------------------------------- HPI Details Patient Name: Date of Service: [name], Karla [name] 08/23/2023 2:45 PM Medical Record Number: [id number] Patient Account Number: [id number] Date of Birth/Sex: Treating RN: 04-13-1961 (62 y.o. F) Primary Care Provider: [name] Other Clinician: Referring Provider: Treating Provider/Extender: [name], [name] Weeks in Treatment: 18 History of Present Illness HPI Description: This 62 year old patient who has a very long significant history of diabetes mellitus, previous alcohol and nicotine abuse, chronic data disease, COPD, diabetes mellitus, height hypertension, critical lower limb ischemia with several wounds being managed at the wound Center at [facility] for over a year. She was recently in the ER at [facility] and was referred to our center. He has had a long history of critical limb ischemia and over a period of time has had balloon angioplasties in March 2016, endarterectomy of the left carotid, lower extremity angiogram and treatment by Dr. Nanetta Batty, several cardiac catheterizations. Most recently she had an x-ray of her left foot while in the ER which showed no acute abnormality.  during this ER visit she was started on ciprofloxacin and asked to continue with the wound care physicians at [facility]. Her last ABI done in June 2017 showed the right side to be 0.28 in the left side to be 0.48. Her right toe brachial index was 0.17 on the right and 0.27 on the left. her last hemoglobin A1c was 11.1% She continues to smoke about a pack of cigarettes a day. 03/28/2017 -- -- right foot x-ray -- IMPRESSION:Areas of soft tissue swelling. Mild subluxation second PIP joint. No frank dislocation or fracture. No erosive change or bony destruction. No soft tissue ulceration or radiopaque foreign body evident by radiography. There is plantar fascia calcification with a nearby inferior calcaneal spur. There are foci of arterial vascular calcification. 04/04/2017 -- the patient continues to be noncompliant and continues to complain of a lot of pain and has not done anything about quitting smoking Readmission: 06/26/18 on evaluation today patient presents for reevaluation she has not been seen in our office since May 2018. Since she was last seen here in our office she has undergone testing at Dr. Durenda Age office where it was revealed that she had an ABI of 0.68 with her previous ABI being 0.64 and a left ABI of 0.38 with previous ABI being 0.34 this study which was performed on 04/05/18 was pretty much equivalent to the study performed on 11/22/17. The findings in the end revealed on the right would appear to be moderate right lower extremity arterial disease on the left Dr. Allyson Sabal states moderate in the report but unfortunately this appears to be much more severe at 0.38 compared to the right. Subsequently the patient was scheduled to have angiography with Dr. Allyson Sabal on 04/12/18. This was however canceled due to the fact that the patient was found to have chronic kidney disease stage III and it was to the point that he did not  Entered By: Allen Derry on 08/23/2023 13:52:36 -------------------------------------------------------------------------------- SuperBill Details Patient Name: Date of Service: [name] 08/23/2023 Medical Record Number: [id number] Patient Account Number: [id number] Date of Birth/Sex: Treating RN: 07/04/1961 (62 y.o. 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 L97.522 Non-pressure chronic ulcer of other part of left foot with fat layer exposed E11.622 Type 2 diabetes mellitus with other skin ulcer L97.812 Non-pressure chronic ulcer of other part of right lower leg with fat layer exposed I73.89 Other specified peripheral vascular diseases I87.331 Chronic venous hypertension (idiopathic) with ulcer and inflammation of right lower extremity Facility Procedures : CPT4 Code: 46962952 Description: 99214 - WOUND CARE VISIT-LEV 4 EST PT Modifier: Quantity: 1 Physician Procedures : CPT4 Code Description Modifier [id number] 99213 - WC PHYS LEVEL 3 - EST PT ICD-10 Diagnosis Description 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 L97.522  Non-pressure chronic ulcer of other part of left foot with fat layer exposed E11.622 Type 2 diabetes mellitus with other skin ulcer Quantity: 1 Electronic Signature(s) Signed: 08/23/2023 4:54:15 PM By: Allen Derry PA-C Previous Signature: 08/23/2023 4:43:13 PM Version By: Allen Derry PA-C Entered By: Allen Derry on 08/23/2023 13:54:15  Entered By: Allen Derry on 08/23/2023 13:52:36 -------------------------------------------------------------------------------- SuperBill Details Patient Name: Date of Service: [name] 08/23/2023 Medical Record Number: [id number] Patient Account Number: [id number] Date of Birth/Sex: Treating RN: 07/04/1961 (62 y.o. 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 L97.522 Non-pressure chronic ulcer of other part of left foot with fat layer exposed E11.622 Type 2 diabetes mellitus with other skin ulcer L97.812 Non-pressure chronic ulcer of other part of right lower leg with fat layer exposed I73.89 Other specified peripheral vascular diseases I87.331 Chronic venous hypertension (idiopathic) with ulcer and inflammation of right lower extremity Facility Procedures : CPT4 Code: 46962952 Description: 99214 - WOUND CARE VISIT-LEV 4 EST PT Modifier: Quantity: 1 Physician Procedures : CPT4 Code Description Modifier [id number] 99213 - WC PHYS LEVEL 3 - EST PT ICD-10 Diagnosis Description 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 L97.522  Non-pressure chronic ulcer of other part of left foot with fat layer exposed E11.622 Type 2 diabetes mellitus with other skin ulcer Quantity: 1 Electronic Signature(s) Signed: 08/23/2023 4:54:15 PM By: Allen Derry PA-C Previous Signature: 08/23/2023 4:43:13 PM Version By: Allen Derry PA-C Entered By: Allen Derry on 08/23/2023 13:54:15  [name], [name] ([id number]) [phone].pdf Page 1 of 12 Visit Report for 08/23/2023 Chief Complaint Document Details Patient Name: Date of Service: [name] 08/23/2023 2:45 PM Medical Record Number: [id number] Patient Account Number: [id number] Date of Birth/Sex: Treating RN: [birth year] (62 y.o. F) Primary Care Provider: [name] Other Clinician: Referring Provider: Treating Provider/Extender: [name], [name] Weeks in Treatment: 18 Information Obtained from: Patient Chief Complaint Bilateral LE Ulcers Electronic Signature(s) Signed: 08/23/2023 3:37:49 PM By: Allen Derry PA-C Entered By: Allen Derry on 08/23/2023 12:37:48 -------------------------------------------------------------------------------- HPI Details Patient Name: Date of Service: [name], Karla [name] 08/23/2023 2:45 PM Medical Record Number: [id number] Patient Account Number: [id number] Date of Birth/Sex: Treating RN: 04-13-1961 (62 y.o. F) Primary Care Provider: [name] Other Clinician: Referring Provider: Treating Provider/Extender: [name], [name] Weeks in Treatment: 18 History of Present Illness HPI Description: This 62 year old patient who has a very long significant history of diabetes mellitus, previous alcohol and nicotine abuse, chronic data disease, COPD, diabetes mellitus, height hypertension, critical lower limb ischemia with several wounds being managed at the wound Center at [facility] for over a year. She was recently in the ER at [facility] and was referred to our center. He has had a long history of critical limb ischemia and over a period of time has had balloon angioplasties in March 2016, endarterectomy of the left carotid, lower extremity angiogram and treatment by Dr. Nanetta Batty, several cardiac catheterizations. Most recently she had an x-ray of her left foot while in the ER which showed no acute abnormality.  during this ER visit she was started on ciprofloxacin and asked to continue with the wound care physicians at [facility]. Her last ABI done in June 2017 showed the right side to be 0.28 in the left side to be 0.48. Her right toe brachial index was 0.17 on the right and 0.27 on the left. her last hemoglobin A1c was 11.1% She continues to smoke about a pack of cigarettes a day. 03/28/2017 -- -- right foot x-ray -- IMPRESSION:Areas of soft tissue swelling. Mild subluxation second PIP joint. No frank dislocation or fracture. No erosive change or bony destruction. No soft tissue ulceration or radiopaque foreign body evident by radiography. There is plantar fascia calcification with a nearby inferior calcaneal spur. There are foci of arterial vascular calcification. 04/04/2017 -- the patient continues to be noncompliant and continues to complain of a lot of pain and has not done anything about quitting smoking Readmission: 06/26/18 on evaluation today patient presents for reevaluation she has not been seen in our office since May 2018. Since she was last seen here in our office she has undergone testing at Dr. Durenda Age office where it was revealed that she had an ABI of 0.68 with her previous ABI being 0.64 and a left ABI of 0.38 with previous ABI being 0.34 this study which was performed on 04/05/18 was pretty much equivalent to the study performed on 11/22/17. The findings in the end revealed on the right would appear to be moderate right lower extremity arterial disease on the left Dr. Allyson Sabal states moderate in the report but unfortunately this appears to be much more severe at 0.38 compared to the right. Subsequently the patient was scheduled to have angiography with Dr. Allyson Sabal on 04/12/18. This was however canceled due to the fact that the patient was found to have chronic kidney disease stage III and it was to the point that he did not  in regard to her wound. This is actually showing signs of looking about the same I did clear a lot of callus up last week this actually looks better to me from an overall visual standpoint than last week though it is a little larger because of the callus I had removed. With that being said I feel like she is actually making really good progress here towards complete closure. No fevers, chills, nausea, vomiting, or diarrhea. 08-09-2023 upon evaluation today patient presents for follow-up concerning her ongoing issues with her right plantar foot ulcer. She is going to be having arteriogram on Friday that is just just 2 days and hopefully following this it will actually allow for more aggressive healing in regard to the right plantar foot. Today I am going to perform some debridement to clear away some of the necrotic debris. 08-23-2023 upon evaluation patient's wound really appears to be doing about the same. She did have her arteriogram with Dr. Lenell Antu and that was on 9-27- 2024. Unfortunately it was noted that the patient had no good options for further revascularization. They plan to reevaluate there in a month but she is stated to  be at "high risk for major amputation". 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 Subsequently this means that there is really not good blood flow into the ankle and foot which means that the patient really does not have a good chance of getting this to heal. Electronic Signature(s) Signed: 08/23/2023 4:50:46 PM By: Allen Derry PA-C Entered By: Allen Derry on 08/23/2023 13:50:46 -------------------------------------------------------------------------------- Physical Exam Details Patient Name: Date of Service: [name], Karla [name] 08/23/2023 2:45 PM Medical Record Number: [id number] Patient Account Number: [id number] Date of Birth/Sex: Treating RN: 1961/11/01 (61 y.o. F) Primary Care Provider: [name] Other Clinician: Referring Provider: Treating Provider/Extender: [name], [name] Weeks in Treatment: 18 Constitutional Obese and well-hydrated in no acute distress. Respiratory normal breathing without difficulty. Psychiatric this patient is able to make decisions and demonstrates good insight into disease process. Alert and Oriented x 3. pleasant and cooperative. Notes Upon inspection patient's wound bed actually unfortunately appears to be doing about the same. There really is not much that I can Karla from the standpoint of improving her blood flow if she really does not have any good vascular options at this point. Nonetheless the goal is to try to keep this as clean as possible and Karla what we can I am just not certain she is gena be able to heal this wound and I discussed that with her today I think she is at high risk for amputation. Electronic Signature(s) Signed: 08/23/2023 4:51:43 PM By: Allen Derry PA-C Entered By: Allen Derry on 08/23/2023  13:51:43 -------------------------------------------------------------------------------- Physician Orders Details Patient Name: Date of Service: [name], Karla [name] 08/23/2023 2:45 PM Medical Record Number: [id number] Patient Account Number: [id number] Date of Birth/Sex: Treating RN: [birth year] (61 y.o. 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 Secured With: [employer], 4.5x3.1 (in/yd) 3 x Per Week/30 Days Discharge Instructions: Secure with Kerlix as directed. Secured With: 32M Medipore H Soft Cloth Surgical T ape, 4 x 10 (in/yd) 3 x Per Week/30 Days Discharge Instructions: Secure with tape as directed. Compression Wrap: tubigrip size E single layer 3 x Per Week/30 Days Discharge Instructions: apply in the morning and remove at night. Electronic Signature(s) Signed: 08/23/2023 4:43:13 PM By: Allen Derry PA-C Signed: 08/23/2023 5:49:45 PM By: Shawn Stall RN, BSN Entered By: Shawn Stall on 08/23/2023 12:46:41 -------------------------------------------------------------------------------- Problem List Details Patient Name: Date of Service: [name], Karla [name] 08/23/2023 2:45 PM Medical Record Number:  [id number] Patient Account Number: [id number] Date of Birth/Sex: Treating RN: 1961-08-19 (61 y.o. [name], Millard.Loa Primary Care Provider: [name] Other Clinician: Referring Provider: Treating Provider/Extender: [name], [name] Weeks in Treatment: 18 Active Problems ICD-10 Encounter Code Description Active Date MDM Diagnosis 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 2 diabetes mellitus with foot ulcer 04/19/2023 No Yes L97.512 Non-pressure chronic ulcer of other part of right foot with fat layer exposed 04/19/2023 No Yes L97.522 Non-pressure chronic ulcer of other part of left foot with fat layer exposed 04/19/2023 No Yes E11.622 Type 2 diabetes mellitus with other skin ulcer 04/19/2023 No Yes L97.812 Non-pressure chronic ulcer of other part of right lower leg with fat layer 04/19/2023 No Yes exposed I73.89 Other specified peripheral vascular diseases 04/19/2023 No Yes Weyers, Danetta S ([id number]) [id number] Page 7 of 12 I87.331 Chronic venous hypertension (idiopathic) with ulcer and inflammation of right 04/19/2023 No Yes lower extremity Inactive Problems Resolved Problems Electronic Signature(s) Signed: 08/23/2023 3:37:41 PM By: Allen Derry PA-C Entered By: Allen Derry on 08/23/2023 12:37:40 -------------------------------------------------------------------------------- Progress Note Details Patient Name: Date of Service: Karla Karla Mango, Karla [name] 08/23/2023 2:45 PM Medical Record Number: [id number] Patient Account Number: [id number] Date of Birth/Sex: Treating RN: Oct 11, 1961 (62 y.o. F) Primary Care Provider: [name] Other Clinician: Referring Provider: Treating Provider/Extender: [name], [name] Weeks in Treatment: 18 Subjective Chief Complaint Information obtained from Patient Bilateral LE Ulcers History of Present Illness (HPI) This 62 year old patient who has a very long significant history of diabetes mellitus, previous alcohol and nicotine  abuse, chronic data disease, COPD, diabetes mellitus, height hypertension, critical lower limb ischemia with several wounds being managed at the wound Center at [facility] for over a year. She was recently in the ER at [facility] and was referred to our center. He has had a long history of critical limb ischemia and over a period of time has had balloon angioplasties in March 2016, endarterectomy of the left carotid, lower extremity angiogram and treatment by Dr. Nanetta Batty, several cardiac catheterizations. Most recently she had an x-ray of her left foot while in the ER which showed no acute abnormality. during this ER visit she was started on ciprofloxacin and asked to continue with the wound care physicians at [facility]. Her last ABI done in June 2017 showed the right side to be 0.28 in the left side to be 0.48. Her right toe brachial index was 0.17 on the right and 0.27 on the left. her last hemoglobin A1c was 11.1% She continues to smoke about a pack of cigarettes a day. 03/28/2017 -- -- right foot x-ray -- IMPRESSION:Areas of soft tissue swelling.  Entered By: Allen Derry on 08/23/2023 13:52:36 -------------------------------------------------------------------------------- SuperBill Details Patient Name: Date of Service: [name] 08/23/2023 Medical Record Number: [id number] Patient Account Number: [id number] Date of Birth/Sex: Treating RN: 07/04/1961 (62 y.o. 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 L97.522 Non-pressure chronic ulcer of other part of left foot with fat layer exposed E11.622 Type 2 diabetes mellitus with other skin ulcer L97.812 Non-pressure chronic ulcer of other part of right lower leg with fat layer exposed I73.89 Other specified peripheral vascular diseases I87.331 Chronic venous hypertension (idiopathic) with ulcer and inflammation of right lower extremity Facility Procedures : CPT4 Code: 46962952 Description: 99214 - WOUND CARE VISIT-LEV 4 EST PT Modifier: Quantity: 1 Physician Procedures : CPT4 Code Description Modifier [id number] 99213 - WC PHYS LEVEL 3 - EST PT ICD-10 Diagnosis Description 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 L97.522  Non-pressure chronic ulcer of other part of left foot with fat layer exposed E11.622 Type 2 diabetes mellitus with other skin ulcer Quantity: 1 Electronic Signature(s) Signed: 08/23/2023 4:54:15 PM By: Allen Derry PA-C Previous Signature: 08/23/2023 4:43:13 PM Version By: Allen Derry PA-C Entered By: Allen Derry on 08/23/2023 13:54:15  Entered By: Allen Derry on 08/23/2023 13:52:36 -------------------------------------------------------------------------------- SuperBill Details Patient Name: Date of Service: [name] 08/23/2023 Medical Record Number: [id number] Patient Account Number: [id number] Date of Birth/Sex: Treating RN: 07/04/1961 (62 y.o. 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 L97.522 Non-pressure chronic ulcer of other part of left foot with fat layer exposed E11.622 Type 2 diabetes mellitus with other skin ulcer L97.812 Non-pressure chronic ulcer of other part of right lower leg with fat layer exposed I73.89 Other specified peripheral vascular diseases I87.331 Chronic venous hypertension (idiopathic) with ulcer and inflammation of right lower extremity Facility Procedures : CPT4 Code: 46962952 Description: 99214 - WOUND CARE VISIT-LEV 4 EST PT Modifier: Quantity: 1 Physician Procedures : CPT4 Code Description Modifier [id number] 99213 - WC PHYS LEVEL 3 - EST PT ICD-10 Diagnosis Description 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 L97.522  Non-pressure chronic ulcer of other part of left foot with fat layer exposed E11.622 Type 2 diabetes mellitus with other skin ulcer Quantity: 1 Electronic Signature(s) Signed: 08/23/2023 4:54:15 PM By: Allen Derry PA-C Previous Signature: 08/23/2023 4:43:13 PM Version By: Allen Derry PA-C Entered By: Allen Derry on 08/23/2023 13:54:15  [name], [name] ([id number]) [phone].pdf Page 1 of 12 Visit Report for 08/23/2023 Chief Complaint Document Details Patient Name: Date of Service: [name] 08/23/2023 2:45 PM Medical Record Number: [id number] Patient Account Number: [id number] Date of Birth/Sex: Treating RN: [birth year] (62 y.o. F) Primary Care Provider: [name] Other Clinician: Referring Provider: Treating Provider/Extender: [name], [name] Weeks in Treatment: 18 Information Obtained from: Patient Chief Complaint Bilateral LE Ulcers Electronic Signature(s) Signed: 08/23/2023 3:37:49 PM By: Allen Derry PA-C Entered By: Allen Derry on 08/23/2023 12:37:48 -------------------------------------------------------------------------------- HPI Details Patient Name: Date of Service: [name], Karla [name] 08/23/2023 2:45 PM Medical Record Number: [id number] Patient Account Number: [id number] Date of Birth/Sex: Treating RN: 04-13-1961 (62 y.o. F) Primary Care Provider: [name] Other Clinician: Referring Provider: Treating Provider/Extender: [name], [name] Weeks in Treatment: 18 History of Present Illness HPI Description: This 62 year old patient who has a very long significant history of diabetes mellitus, previous alcohol and nicotine abuse, chronic data disease, COPD, diabetes mellitus, height hypertension, critical lower limb ischemia with several wounds being managed at the wound Center at [facility] for over a year. She was recently in the ER at [facility] and was referred to our center. He has had a long history of critical limb ischemia and over a period of time has had balloon angioplasties in March 2016, endarterectomy of the left carotid, lower extremity angiogram and treatment by Dr. Nanetta Batty, several cardiac catheterizations. Most recently she had an x-ray of her left foot while in the ER which showed no acute abnormality.  during this ER visit she was started on ciprofloxacin and asked to continue with the wound care physicians at [facility]. Her last ABI done in June 2017 showed the right side to be 0.28 in the left side to be 0.48. Her right toe brachial index was 0.17 on the right and 0.27 on the left. her last hemoglobin A1c was 11.1% She continues to smoke about a pack of cigarettes a day. 03/28/2017 -- -- right foot x-ray -- IMPRESSION:Areas of soft tissue swelling. Mild subluxation second PIP joint. No frank dislocation or fracture. No erosive change or bony destruction. No soft tissue ulceration or radiopaque foreign body evident by radiography. There is plantar fascia calcification with a nearby inferior calcaneal spur. There are foci of arterial vascular calcification. 04/04/2017 -- the patient continues to be noncompliant and continues to complain of a lot of pain and has not done anything about quitting smoking Readmission: 06/26/18 on evaluation today patient presents for reevaluation she has not been seen in our office since May 2018. Since she was last seen here in our office she has undergone testing at Dr. Durenda Age office where it was revealed that she had an ABI of 0.68 with her previous ABI being 0.64 and a left ABI of 0.38 with previous ABI being 0.34 this study which was performed on 04/05/18 was pretty much equivalent to the study performed on 11/22/17. The findings in the end revealed on the right would appear to be moderate right lower extremity arterial disease on the left Dr. Allyson Sabal states moderate in the report but unfortunately this appears to be much more severe at 0.38 compared to the right. Subsequently the patient was scheduled to have angiography with Dr. Allyson Sabal on 04/12/18. This was however canceled due to the fact that the patient was found to have chronic kidney disease stage III and it was to the point that he did not  Entered By: Allen Derry on 08/23/2023 13:52:36 -------------------------------------------------------------------------------- SuperBill Details Patient Name: Date of Service: [name] 08/23/2023 Medical Record Number: [id number] Patient Account Number: [id number] Date of Birth/Sex: Treating RN: 07/04/1961 (62 y.o. 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 L97.522 Non-pressure chronic ulcer of other part of left foot with fat layer exposed E11.622 Type 2 diabetes mellitus with other skin ulcer L97.812 Non-pressure chronic ulcer of other part of right lower leg with fat layer exposed I73.89 Other specified peripheral vascular diseases I87.331 Chronic venous hypertension (idiopathic) with ulcer and inflammation of right lower extremity Facility Procedures : CPT4 Code: 46962952 Description: 99214 - WOUND CARE VISIT-LEV 4 EST PT Modifier: Quantity: 1 Physician Procedures : CPT4 Code Description Modifier [id number] 99213 - WC PHYS LEVEL 3 - EST PT ICD-10 Diagnosis Description 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 L97.522  Non-pressure chronic ulcer of other part of left foot with fat layer exposed E11.622 Type 2 diabetes mellitus with other skin ulcer Quantity: 1 Electronic Signature(s) Signed: 08/23/2023 4:54:15 PM By: Allen Derry PA-C Previous Signature: 08/23/2023 4:43:13 PM Version By: Allen Derry PA-C Entered By: Allen Derry on 08/23/2023 13:54:15  [name], [name] ([id number]) [phone].pdf Page 1 of 12 Visit Report for 08/23/2023 Chief Complaint Document Details Patient Name: Date of Service: [name] 08/23/2023 2:45 PM Medical Record Number: [id number] Patient Account Number: [id number] Date of Birth/Sex: Treating RN: [birth year] (62 y.o. F) Primary Care Provider: [name] Other Clinician: Referring Provider: Treating Provider/Extender: [name], [name] Weeks in Treatment: 18 Information Obtained from: Patient Chief Complaint Bilateral LE Ulcers Electronic Signature(s) Signed: 08/23/2023 3:37:49 PM By: Allen Derry PA-C Entered By: Allen Derry on 08/23/2023 12:37:48 -------------------------------------------------------------------------------- HPI Details Patient Name: Date of Service: [name], Karla [name] 08/23/2023 2:45 PM Medical Record Number: [id number] Patient Account Number: [id number] Date of Birth/Sex: Treating RN: 04-13-1961 (62 y.o. F) Primary Care Provider: [name] Other Clinician: Referring Provider: Treating Provider/Extender: [name], [name] Weeks in Treatment: 18 History of Present Illness HPI Description: This 62 year old patient who has a very long significant history of diabetes mellitus, previous alcohol and nicotine abuse, chronic data disease, COPD, diabetes mellitus, height hypertension, critical lower limb ischemia with several wounds being managed at the wound Center at [facility] for over a year. She was recently in the ER at [facility] and was referred to our center. He has had a long history of critical limb ischemia and over a period of time has had balloon angioplasties in March 2016, endarterectomy of the left carotid, lower extremity angiogram and treatment by Dr. Nanetta Batty, several cardiac catheterizations. Most recently she had an x-ray of her left foot while in the ER which showed no acute abnormality.  during this ER visit she was started on ciprofloxacin and asked to continue with the wound care physicians at [facility]. Her last ABI done in June 2017 showed the right side to be 0.28 in the left side to be 0.48. Her right toe brachial index was 0.17 on the right and 0.27 on the left. her last hemoglobin A1c was 11.1% She continues to smoke about a pack of cigarettes a day. 03/28/2017 -- -- right foot x-ray -- IMPRESSION:Areas of soft tissue swelling. Mild subluxation second PIP joint. No frank dislocation or fracture. No erosive change or bony destruction. No soft tissue ulceration or radiopaque foreign body evident by radiography. There is plantar fascia calcification with a nearby inferior calcaneal spur. There are foci of arterial vascular calcification. 04/04/2017 -- the patient continues to be noncompliant and continues to complain of a lot of pain and has not done anything about quitting smoking Readmission: 06/26/18 on evaluation today patient presents for reevaluation she has not been seen in our office since May 2018. Since she was last seen here in our office she has undergone testing at Dr. Durenda Age office where it was revealed that she had an ABI of 0.68 with her previous ABI being 0.64 and a left ABI of 0.38 with previous ABI being 0.34 this study which was performed on 04/05/18 was pretty much equivalent to the study performed on 11/22/17. The findings in the end revealed on the right would appear to be moderate right lower extremity arterial disease on the left Dr. Allyson Sabal states moderate in the report but unfortunately this appears to be much more severe at 0.38 compared to the right. Subsequently the patient was scheduled to have angiography with Dr. Allyson Sabal on 04/12/18. This was however canceled due to the fact that the patient was found to have chronic kidney disease stage III and it was to the point that he did not  Entered By: Allen Derry on 08/23/2023 13:52:36 -------------------------------------------------------------------------------- SuperBill Details Patient Name: Date of Service: [name] 08/23/2023 Medical Record Number: [id number] Patient Account Number: [id number] Date of Birth/Sex: Treating RN: 07/04/1961 (62 y.o. 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 L97.522 Non-pressure chronic ulcer of other part of left foot with fat layer exposed E11.622 Type 2 diabetes mellitus with other skin ulcer L97.812 Non-pressure chronic ulcer of other part of right lower leg with fat layer exposed I73.89 Other specified peripheral vascular diseases I87.331 Chronic venous hypertension (idiopathic) with ulcer and inflammation of right lower extremity Facility Procedures : CPT4 Code: 46962952 Description: 99214 - WOUND CARE VISIT-LEV 4 EST PT Modifier: Quantity: 1 Physician Procedures : CPT4 Code Description Modifier [id number] 99213 - WC PHYS LEVEL 3 - EST PT ICD-10 Diagnosis Description 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 L97.522  Non-pressure chronic ulcer of other part of left foot with fat layer exposed E11.622 Type 2 diabetes mellitus with other skin ulcer Quantity: 1 Electronic Signature(s) Signed: 08/23/2023 4:54:15 PM By: Allen Derry PA-C Previous Signature: 08/23/2023 4:43:13 PM Version By: Allen Derry PA-C Entered By: Allen Derry on 08/23/2023 13:54:15  3 x Per Week/30 Days ary Discharge Instructions: *****OR IODOSORB****Apply to wound bed as instructed Secondary Dressing: ABD Pad, 8x10 3 x Per Week/30 Days Discharge Instructions: Apply over primary dressing as directed. Secured With: [employer], 4.5x3.1 (in/yd) 3 x Per Week/30 Days Discharge Instructions: Secure with Kerlix as directed. Secured With: 32M Medipore H Soft Cloth Surgical T ape, 4 x 10 (in/yd) 3 x Per Week/30 Days Discharge Instructions: Secure with tape as directed. Compression Wrap: tubigrip size E single layer 3 x Per Week/30 Days Discharge Instructions: apply in the morning and remove at night. Electronic Signature(s) Signed: 08/23/2023 4:43:13 PM By: Allen Derry PA-C Signed: 08/23/2023 5:49:45 PM By: Shawn Stall RN, BSN Entered By: Shawn Stall on 08/23/2023 12:46:41 -------------------------------------------------------------------------------- Problem List Details Patient Name: Date of Service: [name], Karla [name] 08/23/2023 2:45 PM Medical Record Number:  [id number] Patient Account Number: [id number] Date of Birth/Sex: Treating RN: 1961-08-19 (61 y.o. [name], Millard.Loa Primary Care Provider: [name] Other Clinician: Referring Provider: Treating Provider/Extender: [name], [name] Weeks in Treatment: 18 Active Problems ICD-10 Encounter Code Description Active Date MDM Diagnosis 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 2 diabetes mellitus with foot ulcer 04/19/2023 No Yes L97.512 Non-pressure chronic ulcer of other part of right foot with fat layer exposed 04/19/2023 No Yes L97.522 Non-pressure chronic ulcer of other part of left foot with fat layer exposed 04/19/2023 No Yes E11.622 Type 2 diabetes mellitus with other skin ulcer 04/19/2023 No Yes L97.812 Non-pressure chronic ulcer of other part of right lower leg with fat layer 04/19/2023 No Yes exposed I73.89 Other specified peripheral vascular diseases 04/19/2023 No Yes Weyers, Danetta S ([id number]) [id number] Page 7 of 12 I87.331 Chronic venous hypertension (idiopathic) with ulcer and inflammation of right 04/19/2023 No Yes lower extremity Inactive Problems Resolved Problems Electronic Signature(s) Signed: 08/23/2023 3:37:41 PM By: Allen Derry PA-C Entered By: Allen Derry on 08/23/2023 12:37:40 -------------------------------------------------------------------------------- Progress Note Details Patient Name: Date of Service: Karla Karla Mango, Karla [name] 08/23/2023 2:45 PM Medical Record Number: [id number] Patient Account Number: [id number] Date of Birth/Sex: Treating RN: Oct 11, 1961 (62 y.o. F) Primary Care Provider: [name] Other Clinician: Referring Provider: Treating Provider/Extender: [name], [name] Weeks in Treatment: 18 Subjective Chief Complaint Information obtained from Patient Bilateral LE Ulcers History of Present Illness (HPI) This 62 year old patient who has a very long significant history of diabetes mellitus, previous alcohol and nicotine  abuse, chronic data disease, COPD, diabetes mellitus, height hypertension, critical lower limb ischemia with several wounds being managed at the wound Center at [facility] for over a year. She was recently in the ER at [facility] and was referred to our center. He has had a long history of critical limb ischemia and over a period of time has had balloon angioplasties in March 2016, endarterectomy of the left carotid, lower extremity angiogram and treatment by Dr. Nanetta Batty, several cardiac catheterizations. Most recently she had an x-ray of her left foot while in the ER which showed no acute abnormality. during this ER visit she was started on ciprofloxacin and asked to continue with the wound care physicians at [facility]. Her last ABI done in June 2017 showed the right side to be 0.28 in the left side to be 0.48. Her right toe brachial index was 0.17 on the right and 0.27 on the left. her last hemoglobin A1c was 11.1% She continues to smoke about a pack of cigarettes a day. 03/28/2017 -- -- right foot x-ray -- IMPRESSION:Areas of soft tissue swelling.  Entered By: Allen Derry on 08/23/2023 13:52:36 -------------------------------------------------------------------------------- SuperBill Details Patient Name: Date of Service: [name] 08/23/2023 Medical Record Number: [id number] Patient Account Number: [id number] Date of Birth/Sex: Treating RN: 07/04/1961 (62 y.o. 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 L97.522 Non-pressure chronic ulcer of other part of left foot with fat layer exposed E11.622 Type 2 diabetes mellitus with other skin ulcer L97.812 Non-pressure chronic ulcer of other part of right lower leg with fat layer exposed I73.89 Other specified peripheral vascular diseases I87.331 Chronic venous hypertension (idiopathic) with ulcer and inflammation of right lower extremity Facility Procedures : CPT4 Code: 46962952 Description: 99214 - WOUND CARE VISIT-LEV 4 EST PT Modifier: Quantity: 1 Physician Procedures : CPT4 Code Description Modifier [id number] 99213 - WC PHYS LEVEL 3 - EST PT ICD-10 Diagnosis Description 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 L97.522  Non-pressure chronic ulcer of other part of left foot with fat layer exposed E11.622 Type 2 diabetes mellitus with other skin ulcer Quantity: 1 Electronic Signature(s) Signed: 08/23/2023 4:54:15 PM By: Allen Derry PA-C Previous Signature: 08/23/2023 4:43:13 PM Version By: Allen Derry PA-C Entered By: Allen Derry on 08/23/2023 13:54:15  Entered By: Allen Derry on 08/23/2023 13:52:36 -------------------------------------------------------------------------------- SuperBill Details Patient Name: Date of Service: [name] 08/23/2023 Medical Record Number: [id number] Patient Account Number: [id number] Date of Birth/Sex: Treating RN: 07/04/1961 (62 y.o. 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 L97.522 Non-pressure chronic ulcer of other part of left foot with fat layer exposed E11.622 Type 2 diabetes mellitus with other skin ulcer L97.812 Non-pressure chronic ulcer of other part of right lower leg with fat layer exposed I73.89 Other specified peripheral vascular diseases I87.331 Chronic venous hypertension (idiopathic) with ulcer and inflammation of right lower extremity Facility Procedures : CPT4 Code: 46962952 Description: 99214 - WOUND CARE VISIT-LEV 4 EST PT Modifier: Quantity: 1 Physician Procedures : CPT4 Code Description Modifier [id number] 99213 - WC PHYS LEVEL 3 - EST PT ICD-10 Diagnosis Description 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 L97.522  Non-pressure chronic ulcer of other part of left foot with fat layer exposed E11.622 Type 2 diabetes mellitus with other skin ulcer Quantity: 1 Electronic Signature(s) Signed: 08/23/2023 4:54:15 PM By: Allen Derry PA-C Previous Signature: 08/23/2023 4:43:13 PM Version By: Allen Derry PA-C Entered By: Allen Derry on 08/23/2023 13:54:15  in regard to her wound. This is actually showing signs of looking about the same I did clear a lot of callus up last week this actually looks better to me from an overall visual standpoint than last week though it is a little larger because of the callus I had removed. With that being said I feel like she is actually making really good progress here towards complete closure. No fevers, chills, nausea, vomiting, or diarrhea. 08-09-2023 upon evaluation today patient presents for follow-up concerning her ongoing issues with her right plantar foot ulcer. She is going to be having arteriogram on Friday that is just just 2 days and hopefully following this it will actually allow for more aggressive healing in regard to the right plantar foot. Today I am going to perform some debridement to clear away some of the necrotic debris. 08-23-2023 upon evaluation patient's wound really appears to be doing about the same. She did have her arteriogram with Dr. Lenell Antu and that was on 9-27- 2024. Unfortunately it was noted that the patient had no good options for further revascularization. They plan to reevaluate there in a month but she is stated to  be at "high risk for major amputation". 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 Subsequently this means that there is really not good blood flow into the ankle and foot which means that the patient really does not have a good chance of getting this to heal. Electronic Signature(s) Signed: 08/23/2023 4:50:46 PM By: Allen Derry PA-C Entered By: Allen Derry on 08/23/2023 13:50:46 -------------------------------------------------------------------------------- Physical Exam Details Patient Name: Date of Service: [name], Karla [name] 08/23/2023 2:45 PM Medical Record Number: [id number] Patient Account Number: [id number] Date of Birth/Sex: Treating RN: 1961/11/01 (61 y.o. F) Primary Care Provider: [name] Other Clinician: Referring Provider: Treating Provider/Extender: [name], [name] Weeks in Treatment: 18 Constitutional Obese and well-hydrated in no acute distress. Respiratory normal breathing without difficulty. Psychiatric this patient is able to make decisions and demonstrates good insight into disease process. Alert and Oriented x 3. pleasant and cooperative. Notes Upon inspection patient's wound bed actually unfortunately appears to be doing about the same. There really is not much that I can Karla from the standpoint of improving her blood flow if she really does not have any good vascular options at this point. Nonetheless the goal is to try to keep this as clean as possible and Karla what we can I am just not certain she is gena be able to heal this wound and I discussed that with her today I think she is at high risk for amputation. Electronic Signature(s) Signed: 08/23/2023 4:51:43 PM By: Allen Derry PA-C Entered By: Allen Derry on 08/23/2023  13:51:43 -------------------------------------------------------------------------------- Physician Orders Details Patient Name: Date of Service: [name], Karla [name] 08/23/2023 2:45 PM Medical Record Number: [id number] Patient Account Number: [id number] Date of Birth/Sex: Treating RN: [birth year] (61 y.o. 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 This is actually showing signs of looking about the same I did clear a lot of callus up last week this actually looks better to me from an overall visual standpoint than last week though it is a little larger because of the callus I had removed. With that being said I feel like she is actually making really good progress here towards complete closure. No fevers, chills, nausea, vomiting, or diarrhea. 08-09-2023 upon evaluation today patient presents for follow-up concerning her ongoing issues with her right plantar foot ulcer. She is going to be having arteriogram on Friday that is just just 2 days and hopefully following this it will actually allow for more aggressive healing in regard to the right plantar foot. Today I am going to perform some debridement to clear away some of the necrotic debris. 08-23-2023 upon evaluation patient's wound really appears to be doing about the same. She did have her arteriogram with Dr. Lenell Antu and that was on 9-27- 2024. Unfortunately it was noted that the patient had no good options for further revascularization. They plan to reevaluate there in a month but she is stated to  be at "high risk for major amputation". 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 Subsequently this means that there is really not good blood flow into the ankle and foot which means that the patient really does not have a good chance of getting this to heal. Electronic Signature(s) Signed: 08/23/2023 4:50:46 PM By: Allen Derry PA-C Entered By: Allen Derry on 08/23/2023 13:50:46 -------------------------------------------------------------------------------- Physical Exam Details Patient Name: Date of Service: [name], Karla [name] 08/23/2023 2:45 PM Medical Record Number: [id number] Patient Account Number: [id number] Date of Birth/Sex: Treating RN: 1961/11/01 (61 y.o. F) Primary Care Provider: [name] Other Clinician: Referring Provider: Treating Provider/Extender: [name], [name] Weeks in Treatment: 18 Constitutional Obese and well-hydrated in no acute distress. Respiratory normal breathing without difficulty. Psychiatric this patient is able to make decisions and demonstrates good insight into disease process. Alert and Oriented x 3. pleasant and cooperative. Notes Upon inspection patient's wound bed actually unfortunately appears to be doing about the same. There really is not much that I can Karla from the standpoint of improving her blood flow if she really does not have any good vascular options at this point. Nonetheless the goal is to try to keep this as clean as possible and Karla what we can I am just not certain she is gena be able to heal this wound and I discussed that with her today I think she is at high risk for amputation. Electronic Signature(s) Signed: 08/23/2023 4:51:43 PM By: Allen Derry PA-C Entered By: Allen Derry on 08/23/2023  13:51:43 -------------------------------------------------------------------------------- Physician Orders Details Patient Name: Date of Service: [name], Karla [name] 08/23/2023 2:45 PM Medical Record Number: [id number] Patient Account Number: [id number] Date of Birth/Sex: Treating RN: [birth year] (61 y.o. [name] Primary Care Provider: [name] Other Clinician: Referring Provider: Treating Provider/Extender: [name], [name] Weeks in Treatment: [address] [place] ([id number]) [id number] Page 5 of 12 The following information was scribed by: Shawn Stall The information was scribed for: Lenda Kelp Verbal / Phone Orders: No Diagnosis Coding ICD-10 Coding Code Description 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.512 Non-pressure chronic ulcer of other part of right foot  Entered By: Allen Derry on 08/23/2023 13:52:36 -------------------------------------------------------------------------------- SuperBill Details Patient Name: Date of Service: [name] 08/23/2023 Medical Record Number: [id number] Patient Account Number: [id number] Date of Birth/Sex: Treating RN: 07/04/1961 (62 y.o. 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 L97.522 Non-pressure chronic ulcer of other part of left foot with fat layer exposed E11.622 Type 2 diabetes mellitus with other skin ulcer L97.812 Non-pressure chronic ulcer of other part of right lower leg with fat layer exposed I73.89 Other specified peripheral vascular diseases I87.331 Chronic venous hypertension (idiopathic) with ulcer and inflammation of right lower extremity Facility Procedures : CPT4 Code: 46962952 Description: 99214 - WOUND CARE VISIT-LEV 4 EST PT Modifier: Quantity: 1 Physician Procedures : CPT4 Code Description Modifier [id number] 99213 - WC PHYS LEVEL 3 - EST PT ICD-10 Diagnosis Description 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 L97.522  Non-pressure chronic ulcer of other part of left foot with fat layer exposed E11.622 Type 2 diabetes mellitus with other skin ulcer Quantity: 1 Electronic Signature(s) Signed: 08/23/2023 4:54:15 PM By: Allen Derry PA-C Previous Signature: 08/23/2023 4:43:13 PM Version By: Allen Derry PA-C Entered By: Allen Derry on 08/23/2023 13:54:15  [name], [name] ([id number]) [phone].pdf Page 1 of 12 Visit Report for 08/23/2023 Chief Complaint Document Details Patient Name: Date of Service: [name] 08/23/2023 2:45 PM Medical Record Number: [id number] Patient Account Number: [id number] Date of Birth/Sex: Treating RN: [birth year] (62 y.o. F) Primary Care Provider: [name] Other Clinician: Referring Provider: Treating Provider/Extender: [name], [name] Weeks in Treatment: 18 Information Obtained from: Patient Chief Complaint Bilateral LE Ulcers Electronic Signature(s) Signed: 08/23/2023 3:37:49 PM By: Allen Derry PA-C Entered By: Allen Derry on 08/23/2023 12:37:48 -------------------------------------------------------------------------------- HPI Details Patient Name: Date of Service: [name], Karla [name] 08/23/2023 2:45 PM Medical Record Number: [id number] Patient Account Number: [id number] Date of Birth/Sex: Treating RN: 04-13-1961 (62 y.o. F) Primary Care Provider: [name] Other Clinician: Referring Provider: Treating Provider/Extender: [name], [name] Weeks in Treatment: 18 History of Present Illness HPI Description: This 62 year old patient who has a very long significant history of diabetes mellitus, previous alcohol and nicotine abuse, chronic data disease, COPD, diabetes mellitus, height hypertension, critical lower limb ischemia with several wounds being managed at the wound Center at [facility] for over a year. She was recently in the ER at [facility] and was referred to our center. He has had a long history of critical limb ischemia and over a period of time has had balloon angioplasties in March 2016, endarterectomy of the left carotid, lower extremity angiogram and treatment by Dr. Nanetta Batty, several cardiac catheterizations. Most recently she had an x-ray of her left foot while in the ER which showed no acute abnormality.  during this ER visit she was started on ciprofloxacin and asked to continue with the wound care physicians at [facility]. Her last ABI done in June 2017 showed the right side to be 0.28 in the left side to be 0.48. Her right toe brachial index was 0.17 on the right and 0.27 on the left. her last hemoglobin A1c was 11.1% She continues to smoke about a pack of cigarettes a day. 03/28/2017 -- -- right foot x-ray -- IMPRESSION:Areas of soft tissue swelling. Mild subluxation second PIP joint. No frank dislocation or fracture. No erosive change or bony destruction. No soft tissue ulceration or radiopaque foreign body evident by radiography. There is plantar fascia calcification with a nearby inferior calcaneal spur. There are foci of arterial vascular calcification. 04/04/2017 -- the patient continues to be noncompliant and continues to complain of a lot of pain and has not done anything about quitting smoking Readmission: 06/26/18 on evaluation today patient presents for reevaluation she has not been seen in our office since May 2018. Since she was last seen here in our office she has undergone testing at Dr. Durenda Age office where it was revealed that she had an ABI of 0.68 with her previous ABI being 0.64 and a left ABI of 0.38 with previous ABI being 0.34 this study which was performed on 04/05/18 was pretty much equivalent to the study performed on 11/22/17. The findings in the end revealed on the right would appear to be moderate right lower extremity arterial disease on the left Dr. Allyson Sabal states moderate in the report but unfortunately this appears to be much more severe at 0.38 compared to the right. Subsequently the patient was scheduled to have angiography with Dr. Allyson Sabal on 04/12/18. This was however canceled due to the fact that the patient was found to have chronic kidney disease stage III and it was to the point that he did not

## 2023-08-23 NOTE — Telephone Encounter (Signed)
Johnny Bridge with [facility] is calling in requesting a list of the pt's current medications be faxed over to their pharmacy, fax: [phone].

## 2023-08-23 NOTE — Progress Notes (Addendum)
Paramedicine Encounter    Patient ID: [name], female    DOB: 1961/08/30, 61 y.o.   MRN: [id number]   Complaints-chronic foot pain   Edema-yes and redness  Compliance with meds-yes  Pill box filled-yes  If so, by whom-paramedic   Refills needed-vascepa-does not have  Losartan-needs in 2 days  Torsemide    Pt reports chronic foot pain. She denies increased sob, no c/p or dizziness. Pt went to see [facility] doc yesterday and her lantus was increased to 40U at bedtime.  She also has appoint at [place] [facility] office somewhere and she will decide who she wants to keep. I advised her that too many PCP trying to do same thing ad not knowing about the others is a bad idea and could cause problems. Rt side of chest has slight wheeze to it, but sounds a lot better than it normally does. Left side sounds clear.  Has HHN and PT coming out too.  She did get started on doxy yesterday for rt leg infection. It is red and has sore on front of leg which is covered. She is going to wound doc today for foot ulcer and will show him this as well.   Meds verified and pill box refilled.  Requested refills for meds listed above. Will go back out tomor if losartan can be delivered to place in pill box and for placement of the vascepa.   CBG-304 BP (!) 142/78   Pulse 74   Resp 18   LMP  (LMP Unknown)   SpO2 98%  Weight yesterday-? Last visit weight-215 @ clinic   Patient Care Team: [name], MD as PCP - General (Family Medicine) Runell Gess, MD as PCP - Cardiology (Cardiology) Lanier Prude, MD as PCP - Electrophysiology (Cardiology) Runell Gess, MD as Consulting Physician (Cardiology) Nyoka Cowden, MD as Consulting Physician (Pulmonary Disease) System, Provider Not In  Patient Active Problem List   Diagnosis Date Noted   Severe sepsis with acute organ dysfunction ([name]) 07/27/2023   Urinary tract infection without hematuria 07/27/2023   Encephalopathy acute  07/27/2023   Acute renal failure superimposed on stage 3 chronic kidney disease ([name]) 07/27/2023   Severe sepsis ([name]) 07/27/2023   Pre-ulcerative calluses 04/25/2022   Viral gastroenteritis 04/18/2022   Hyperlipidemia associated with type 2 diabetes mellitus ([name]) 07/29/2021   Uncontrolled type 2 diabetes mellitus with hyperglycemia ([name]) 06/13/2021   Acute kidney injury superimposed on CKD ([name]) 06/13/2021   Critical ischemia of foot ([name]) 06/07/2021   Fall 05/05/2021   PAF (paroxysmal atrial fibrillation) ([name]) 05/05/2021   COPD (chronic obstructive pulmonary disease) ([name]) 05/05/2021   DM (diabetes mellitus), type 2 with complications ([name]) 05/05/2021   PAD (peripheral artery disease) ([name]) 05/05/2021   Cellulitis 05/05/2021   Acute on chronic combined systolic and diastolic CHF (congestive heart failure) ([name]) 05/05/2021   OSA (obstructive sleep apnea) 05/05/2021   Stage 3b chronic kidney disease ([name]) 05/05/2021   AKI (acute kidney injury) ([name]) 05/04/2021   Pressure injury of skin 05/04/2021   Ulcer of foot, unspecified laterality, limited to breakdown of skin ([name]) 02/17/2021   AF (paroxysmal atrial fibrillation) ([name]) 01/02/2021   CAD (coronary artery disease) 01/02/2021   PVD (peripheral vascular disease) ([name]) 01/02/2021   Diabetes mellitus type 2, uncontrolled, with complications 01/02/2021   Diabetic nephropathy ([name]) 01/02/2021   Low back pain 06/01/2020   Hyperlipidemia 04/03/2020   Muscle weakness 03/20/2020   Pain in elbow 03/12/2020  PAD (peripheral artery disease) ([name]) 12/26/2019   Acute renal failure ([name])    Acute respiratory failure with hypoxia ([name]) 12/02/2019   Cellulitis in diabetic foot ([name]) 07/08/2019   Osteomyelitis ([name]) 06/21/2019   NICM (nonischemic cardiomyopathy) ([name]) 06/20/2019   Non-healing ulcer ([name]) 06/20/2019   CHF (congestive heart failure) ([name]) 11/26/2018   Coagulation disorder ([name]) 08/09/2017   Depression 07/21/2017   At risk  for adverse drug reaction 06/20/2017   Peripheral neuropathy 06/20/2017   S/P transmetatarsal amputation of foot, right ([name]) 06/05/2017   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) 05/19/2017   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[name])    SIRS (systemic inflammatory response syndrome) ([name]) 04/06/2017   Suspected sleep apnea 11/24/2016   Ulcer of toe of right foot, with necrosis of bone ([name]) 10/27/2016   Ulcer of left lower leg ([name]) 05/19/2016   Severe obesity (BMI >= 40) ([name]) 02/24/2016   Morbid obesity ([name]) 02/24/2016   Encounter for therapeutic drug monitoring 02/10/2016   Symptomatic bradycardia 01/12/2016   Hypertension associated with diabetes ([name]) 12/22/2015   Chronic combined systolic and diastolic CHF (congestive heart failure) ([name])    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 11/08/2015   Hypokalemia 11/08/2015   Tobacco abuse 10/23/2015   Coronary artery disease    DOE (dyspnea on exertion) 04/29/2015   Diabetes mellitus type 2, uncontrolled 02/08/2015   Influenza A 02/07/2015   Wrist fracture, left, closed, initial encounter 01/29/2015   PAF (paroxysmal atrial fibrillation) ([name]) 01/16/2015   Carotid arterial disease ([name]) 01/16/2015   Claudication ([name]) 01/15/2015   Demand ischemia ([name]) 10/29/2014   Insomnia 02/03/2014   S/P peripheral artery angioplasty - TurboHawk atherectomy; R SFA 09/11/2013    Class: Acute   Leg pain, bilateral 08/19/2013   Hypothyroidism 07/31/2013   Cellulitis 06/13/2013   History of cocaine abuse ([name]) 06/13/2013   Long term current use of anticoagulant therapy 05/20/2013   Alcohol abuse    Narcotic abuse ([name])    Marijuana abuse    Alcoholic cirrhosis ([name])    DM (diabetes mellitus), type 2 with peripheral vascular complications ([name])     Current Outpatient Medications:    ACCU-CHEK GUIDE test strip, USE TO CHECK BLOOD SUGAR FOUR TIMES DAILY, Disp: , Rfl:    acetaminophen (TYLENOL) 500 MG  tablet, Take 1,000 mg by mouth every 6 (six) hours as needed for headache (pain)., Disp: , Rfl:    albuterol (VENTOLIN HFA) 108 (90 Base) MCG/ACT inhaler, USE 2 PUFFS BY MOUTH EVERY FOUR HOURS, AS NEEDED, FOR COUGHING/WHEEZING, Disp: 8.5 g, Rfl: 0   apixaban (ELIQUIS) 5 MG TABS tablet, Take 1 tablet (5 mg total) by mouth 2 (two) times daily., Disp: 60 tablet, Rfl: 6   budesonide-formoterol (SYMBICORT) 160-4.5 MCG/ACT inhaler, Inhale 2 puffs into the lungs 2 (two) times daily., Disp: 1 each, Rfl: 2   buPROPion ER (WELLBUTRIN SR) 100 MG 12 hr tablet, Take 1 tablet (100 mg total) by mouth daily., Disp: 30 tablet, Rfl: 6   Cholecalciferol (VITAMIN D3 PO), Take 1 capsule by mouth every morning., Disp: , Rfl:    colchicine 0.6 MG tablet, Take 1.2 mg by mouth See admin instructions. Take 1.2 mg for flair up and an hour later take 0.6 mg if needed, Disp: , Rfl:    doxycycline (VIBRAMYCIN) 100 MG capsule, Take 100 mg by mouth 2 (two) times daily., Disp: , Rfl:    EASY COMFORT PEN NEEDLES 31G X 5 MM MISC, USE FOUR TIMES  A DAY FOR INSULIN ADMINISTRATION, Disp: 100 each, Rfl: 1   icosapent Ethyl (VASCEPA) 1 g capsule, Take 2 capsules (2 g total) by mouth 2 (two) times daily., Disp: 120 capsule, Rfl: 6   insulin lispro (HUMALOG) 100 UNIT/ML KwikPen, Inject 10 Units into the skin 3 (three) times daily with meals., Disp: 15 mL, Rfl: 0   LANTUS SOLOSTAR 100 UNIT/ML Solostar Pen, Inject 37 Units into the skin daily. (Patient taking differently: Inject 40 Units into the skin daily.), Disp: 15 mL, Rfl: 0   levothyroxine (SYNTHROID) 50 MCG tablet, TAKE 1 TABLET (50 MCG TOTAL) BY MOUTH DAILY BEFORE BREAKFAST (AM), Disp: 30 tablet, Rfl: 0   losartan (COZAAR) 25 MG tablet, TAKE 2 TABLETS (50 MG TOTAL) BY MOUTH DAILY (AM), Disp: 60 tablet, Rfl: 0   metoprolol tartrate (LOPRESSOR) 25 MG tablet, Take 0.5 tablets (12.5 mg total) by mouth 2 (two) times daily., Disp: 60 tablet, Rfl: 1   nystatin cream (MYCOSTATIN), Apply  topically 2 (two) times daily., Disp: 30 g, Rfl: 0   Omega-3 Fatty Acids (FISH OIL PO), Take 1 capsule by mouth every morning., Disp: , Rfl:    pantoprazole (PROTONIX) 40 MG tablet, TAKE 1 TABLET (40 MG TOTAL) BY MOUTH DAILY(AM), Disp: 30 tablet, Rfl: 0   polyethylene glycol (MIRALAX / GLYCOLAX) 17 g packet, Take 17 g by mouth daily as needed for moderate constipation., Disp: 14 each, Rfl: 0   rosuvastatin (CRESTOR) 40 MG tablet, Take 1 tablet (40 mg total) by mouth daily., Disp: 90 tablet, Rfl: 3   torsemide (DEMADEX) 100 MG tablet, TAKE 1 TABLET (100 MG TOTAL) BY MOUTH 2 (TWO) TIMES DAILY. (AM+BEDTIME), Disp: 60 tablet, Rfl: 3   triamcinolone ointment (KENALOG) 0.1 %, Apply topically 2 (two) times daily., Disp: 454 g, Rfl: 0   VITAMIN E PO, Take 1 tablet by mouth daily., Disp: , Rfl:    metolazone (ZAROXOLYN) 2.5 MG tablet, TO BE USED AS DIRECTED BY HEART FAILURE CLINIC (Patient not taking: Reported on 08/23/2023), Disp: 10 tablet, Rfl: 0 Allergies  Allergen Reactions   Gabapentin Nausea And Vomiting and Other (See Comments)    POSSIBLE SHAKING   Lyrica [Pregabalin] Other (See Comments)    Shaking       Social History   Socioeconomic History   Marital status: Single    Spouse name: Not on file   Number of children: 1   Years of education: 12   Highest education level: 12th grade  Occupational History   Occupation: disabled  Tobacco Use   Smoking status: Former    Current packs/day: 1.00    Average packs/day: 1 pack/day for 44.0 years (44.0 ttl pk-yrs)    Types: E-cigarettes, Cigarettes   Smokeless tobacco: Former    Types: Snuff  Vaping Use   Vaping status: Former   Devices: 11/26/2018 "stopped months ago"  Substance and Sexual Activity   Alcohol use: Not Currently    Comment: occ   Drug use: Not Currently    Types: "Crack" cocaine, Marijuana, Oxycodone   Sexual activity: Not Currently  Other Topics Concern   Not on file  Social History Narrative   ** Merged History  Encounter **       Lives in [place], in motel with sister.  They are looking to move but don't have a place to go yet.     Social Determinants of Health   Financial Resource Strain: Low Risk  (02/09/2023)   Overall Financial Resource Strain (CARDIA)  Difficulty of Paying Living Expenses: Not hard at all  Food Insecurity: No Food Insecurity (02/09/2023)   Hunger Vital Sign    Worried About Running Out of Food in the Last Year: Never true    Ran Out of Food in the Last Year: Never true  Transportation Needs: No Transportation Needs (02/09/2023)   PRAPARE - Administrator, Civil Service (Medical): No    Lack of Transportation (Non-Medical): No  Physical Activity: Inactive (02/09/2023)   Exercise Vital Sign    Days of Exercise per Week: 0 days    Minutes of Exercise per Session: 0 min  Stress: No Stress Concern Present (02/09/2023)   [employer] of Occupational Health - Occupational Stress Questionnaire    Feeling of Stress : Not at all  Social Connections: Unknown (08/21/2023)   [facility]   Social Network    Social Network: Not on file  Intimate Partner Violence: Unknown (08/21/2023)   Received from [facility]   HITS    Physically Hurt: Not on file    Insult or Talk Down To: Not on file    Threaten Physical Harm: Not on file    Scream or Curse: Not on file    Physical Exam      Future Appointments  Date Time Provider Department Center  08/23/2023  2:45 PM Allen Derry [place] III, PA-C [facility]  08/30/2023  1:15 PM Allen Derry [place] III, PA-C [facility]  09/06/2023  2:00 PM Allen Derry [place] [facility]  09/07/2023  9:50 AM [name], MD [facility] None  09/12/2023  7:30 AM [facility] VASC 1 [facility]  09/12/2023  8:00 AM [facility] HS VASC 1 [facility]  09/12/2023  8:40 AM Leonie Douglas, MD [facility]  10/03/2023  2:20 PM Laurey Morale, MD [facility] None  02/12/2024  4:00 PM [facility] VISIT [facility]  None       Kerry Hough, Paramedic [phone] [facility] Paramedic  08/23/23

## 2023-08-23 NOTE — Telephone Encounter (Unsigned)
Copied from CRM [id number]. Topic: Quick Communication - Home Health Verbal Orders >> Aug 23, 2023 11:24 AM Macon Large wrote: Caller/Agency: Will with [employer] Number: [phone] Requesting OT/PT/Skilled Nursing/Social Work/Speech Therapy: OT Frequency: 2 x 2 1 x 1 starting next week.

## 2023-08-24 ENCOUNTER — Telehealth (HOSPITAL_COMMUNITY): Payer: Self-pay | Admitting: Cardiology

## 2023-08-24 ENCOUNTER — Other Ambulatory Visit (HOSPITAL_COMMUNITY): Payer: Self-pay

## 2023-08-24 DIAGNOSIS — I10 Essential (primary) hypertension: Secondary | ICD-10-CM

## 2023-08-24 MED ORDER — LOSARTAN POTASSIUM 25 MG PO TABS: ORAL_TABLET | ORAL | 3 refills | Status: DC

## 2023-08-24 NOTE — Telephone Encounter (Signed)
Script sent  

## 2023-08-24 NOTE — Progress Notes (Addendum)
22.5 cm Vascular Assessment Extremity colors, hair growth, and conditions: Extremity Color: [Right:Hyperpigmented] Hair Growth on Extremity: [Right:No] Temperature of Extremity: [Right:Warm] Capillary Refill: [Right:< 3 seconds] Dependent Rubor: [Right:No Yes] Electronic Signature(Price) Signed: 08/23/2023 5:41:53 PM By: Thayer Dallas Entered By: Thayer Dallas on 08/23/2023 12:18:57 -------------------------------------------------------------------------------- Multi-Disciplinary Care Plan Details Patient Name: Date of Service: Karla Bigness, Karla [name]. 08/23/2023 2:45 PM Medical Record Number: [id number] Patient Account Number: [id number] Date of Birth/Sex: Treating RN: 01-10-1961 (61 y.o. [name] Primary Care Gust Eugene: [name] Other Clinician: Referring Evagelia Knack: Treating Burleigh Brockmann/Extender: [name], [name] Weeks in Treatment: 69 Active Inactive Wound/Skin Impairment Nursing Diagnoses: Impaired tissue integrity Goals: Patient/caregiver will verbalize understanding of skin care regimen Date Initiated: 04/19/2023 Target Resolution Date: 10/19/2023 Goal Status: Active [name], [name] ([id number]) [phone].pdf Page 5 of 8 Interventions: Assess ulceration(Price) every visit Treatment Activities: Skin care regimen initiated : 04/19/2023 Notes: Electronic  Signature(Price) Signed: 08/23/2023 5:49:45 PM By: Shawn Stall RN, BSN Entered By: Shawn Stall on 08/23/2023 12:22:09 -------------------------------------------------------------------------------- Pain Assessment Details Patient Name: Date of Service: Karla Bigness, Karla [name]. 08/23/2023 2:45 PM Medical Record Number: [id number] Patient Account Number: [id number] Date of Birth/Sex: Treating RN: [birth year] (61 y.o. F) Primary Care Aikam Hellickson: [name] Other Clinician: Referring Glena Pharris: Treating Alyana Kreiter/Extender: [name], [name] Weeks in Treatment: 18 Active Problems Location of Pain Severity and Description of Pain Patient Has Paino Yes Site Locations Pain Location: Pain in Ulcers Rate the pain. Current Pain Level: 7 Pain Management and Medication Current Pain Management: Electronic Signature(Price) Signed: 08/23/2023 5:41:53 PM By: Thayer Dallas Entered By: Thayer Dallas on 08/23/2023 12:14:02 -------------------------------------------------------------------------------- Patient/Caregiver Education Details Patient Name: Date of Service: [name] 10/9/2024andnbsp2:45 PM Medical Record Number: [id number] Patient Account Number: [id number] Date of Birth/Gender: Treating RN: [birth year] (62 y.o. [name] Primary Care Physician: [name] Other Clinician: [name] ([id number]) [id number] Page 6 of 8 Referring Physician: Treating Physician/Extender: Juleen China Weeks in Treatment: 18 Education Assessment Education Provided To: Patient Education Topics Provided Wound/Skin Impairment: Handouts: Caring for Your Ulcer Methods: Explain/Verbal Responses: Reinforcements needed Electronic Signature(Price) Signed: 08/23/2023 5:49:45 PM By: Shawn Stall RN, BSN Entered By: Shawn Stall on 08/23/2023 12:22:22 -------------------------------------------------------------------------------- Wound  Assessment Details Patient Name: Date of Service: Karla Bigness, Karla [name]. 08/23/2023 2:45 PM Medical Record Number: [id number] Patient Account Number: [id number] Date of Birth/Sex: Treating RN: 12-08-1960 (61 y.o. F) Primary Care Marlie Kuennen: [name] Other Clinician: Referring Eddy Termine: Treating Aniyah Nobis/Extender: [name], [name] Weeks in Treatment: 18 Wound Status Wound Number: 23 Primary Diabetic Wound/Ulcer of the Lower Extremity Etiology: Wound Location: Right Metatarsal head first Wound Open Wounding Event: Gradually Appeared Status: Date Acquired: 04/18/2021 Comorbid Chronic Obstructive Pulmonary Disease (COPD), Sleep Apnea, Weeks Of Treatment: 18 History: Arrhythmia, Congestive Heart Failure, Coronary Artery Disease, Clustered Wound: No Hypertension, Peripheral Arterial Disease, Cirrhosis , Type II Diabetes, Osteoarthritis, Neuropathy Photos Wound Measurements Length: (cm) 1 Width: (cm) 0 Depth: (cm) 0 Area: (cm) Volume: (cm) % Reduction in Area: 24.9% .9 % Reduction in Volume: 70.1% .2 Epithelialization: Medium (34-66%) 0.707 Tunneling: No 0.141 Undermining: No Wound Description Classification: Grade 2 Wound Margin: Distinct, outline attached Exudate Amount: Medium Exudate Type: Serosanguineous Exudate Color: red, brown [name], [name] ([id number]) Foul Odor After Cleansing: No Slough/Fibrino Yes [phone].pdf Page 7 of 8 Wound Bed Granulation Amount: Large (67-100%) Exposed Structure Granulation Quality: Red Fascia Exposed: No Necrotic Amount: None Present (0%) Fat Layer (Subcutaneous Tissue) Exposed: Yes Tendon Exposed:  22.5 cm Vascular Assessment Extremity colors, hair growth, and conditions: Extremity Color: [Right:Hyperpigmented] Hair Growth on Extremity: [Right:No] Temperature of Extremity: [Right:Warm] Capillary Refill: [Right:< 3 seconds] Dependent Rubor: [Right:No Yes] Electronic Signature(Price) Signed: 08/23/2023 5:41:53 PM By: Thayer Dallas Entered By: Thayer Dallas on 08/23/2023 12:18:57 -------------------------------------------------------------------------------- Multi-Disciplinary Care Plan Details Patient Name: Date of Service: Karla Bigness, Karla [name]. 08/23/2023 2:45 PM Medical Record Number: [id number] Patient Account Number: [id number] Date of Birth/Sex: Treating RN: 01-10-1961 (61 y.o. [name] Primary Care Gust Eugene: [name] Other Clinician: Referring Evagelia Knack: Treating Burleigh Brockmann/Extender: [name], [name] Weeks in Treatment: 69 Active Inactive Wound/Skin Impairment Nursing Diagnoses: Impaired tissue integrity Goals: Patient/caregiver will verbalize understanding of skin care regimen Date Initiated: 04/19/2023 Target Resolution Date: 10/19/2023 Goal Status: Active [name], [name] ([id number]) [phone].pdf Page 5 of 8 Interventions: Assess ulceration(Price) every visit Treatment Activities: Skin care regimen initiated : 04/19/2023 Notes: Electronic  Signature(Price) Signed: 08/23/2023 5:49:45 PM By: Shawn Stall RN, BSN Entered By: Shawn Stall on 08/23/2023 12:22:09 -------------------------------------------------------------------------------- Pain Assessment Details Patient Name: Date of Service: Karla Bigness, Karla [name]. 08/23/2023 2:45 PM Medical Record Number: [id number] Patient Account Number: [id number] Date of Birth/Sex: Treating RN: [birth year] (61 y.o. F) Primary Care Aikam Hellickson: [name] Other Clinician: Referring Glena Pharris: Treating Alyana Kreiter/Extender: [name], [name] Weeks in Treatment: 18 Active Problems Location of Pain Severity and Description of Pain Patient Has Paino Yes Site Locations Pain Location: Pain in Ulcers Rate the pain. Current Pain Level: 7 Pain Management and Medication Current Pain Management: Electronic Signature(Price) Signed: 08/23/2023 5:41:53 PM By: Thayer Dallas Entered By: Thayer Dallas on 08/23/2023 12:14:02 -------------------------------------------------------------------------------- Patient/Caregiver Education Details Patient Name: Date of Service: [name] 10/9/2024andnbsp2:45 PM Medical Record Number: [id number] Patient Account Number: [id number] Date of Birth/Gender: Treating RN: [birth year] (62 y.o. [name] Primary Care Physician: [name] Other Clinician: [name] ([id number]) [id number] Page 6 of 8 Referring Physician: Treating Physician/Extender: Juleen China Weeks in Treatment: 18 Education Assessment Education Provided To: Patient Education Topics Provided Wound/Skin Impairment: Handouts: Caring for Your Ulcer Methods: Explain/Verbal Responses: Reinforcements needed Electronic Signature(Price) Signed: 08/23/2023 5:49:45 PM By: Shawn Stall RN, BSN Entered By: Shawn Stall on 08/23/2023 12:22:22 -------------------------------------------------------------------------------- Wound  Assessment Details Patient Name: Date of Service: Karla Bigness, Karla [name]. 08/23/2023 2:45 PM Medical Record Number: [id number] Patient Account Number: [id number] Date of Birth/Sex: Treating RN: 12-08-1960 (61 y.o. F) Primary Care Marlie Kuennen: [name] Other Clinician: Referring Eddy Termine: Treating Aniyah Nobis/Extender: [name], [name] Weeks in Treatment: 18 Wound Status Wound Number: 23 Primary Diabetic Wound/Ulcer of the Lower Extremity Etiology: Wound Location: Right Metatarsal head first Wound Open Wounding Event: Gradually Appeared Status: Date Acquired: 04/18/2021 Comorbid Chronic Obstructive Pulmonary Disease (COPD), Sleep Apnea, Weeks Of Treatment: 18 History: Arrhythmia, Congestive Heart Failure, Coronary Artery Disease, Clustered Wound: No Hypertension, Peripheral Arterial Disease, Cirrhosis , Type II Diabetes, Osteoarthritis, Neuropathy Photos Wound Measurements Length: (cm) 1 Width: (cm) 0 Depth: (cm) 0 Area: (cm) Volume: (cm) % Reduction in Area: 24.9% .9 % Reduction in Volume: 70.1% .2 Epithelialization: Medium (34-66%) 0.707 Tunneling: No 0.141 Undermining: No Wound Description Classification: Grade 2 Wound Margin: Distinct, outline attached Exudate Amount: Medium Exudate Type: Serosanguineous Exudate Color: red, brown [name], [name] ([id number]) Foul Odor After Cleansing: No Slough/Fibrino Yes [phone].pdf Page 7 of 8 Wound Bed Granulation Amount: Large (67-100%) Exposed Structure Granulation Quality: Red Fascia Exposed: No Necrotic Amount: None Present (0%) Fat Layer (Subcutaneous Tissue) Exposed: Yes Tendon Exposed:  22.5 cm Vascular Assessment Extremity colors, hair growth, and conditions: Extremity Color: [Right:Hyperpigmented] Hair Growth on Extremity: [Right:No] Temperature of Extremity: [Right:Warm] Capillary Refill: [Right:< 3 seconds] Dependent Rubor: [Right:No Yes] Electronic Signature(Price) Signed: 08/23/2023 5:41:53 PM By: Thayer Dallas Entered By: Thayer Dallas on 08/23/2023 12:18:57 -------------------------------------------------------------------------------- Multi-Disciplinary Care Plan Details Patient Name: Date of Service: Karla Bigness, Karla [name]. 08/23/2023 2:45 PM Medical Record Number: [id number] Patient Account Number: [id number] Date of Birth/Sex: Treating RN: 01-10-1961 (61 y.o. [name] Primary Care Gust Eugene: [name] Other Clinician: Referring Evagelia Knack: Treating Burleigh Brockmann/Extender: [name], [name] Weeks in Treatment: 69 Active Inactive Wound/Skin Impairment Nursing Diagnoses: Impaired tissue integrity Goals: Patient/caregiver will verbalize understanding of skin care regimen Date Initiated: 04/19/2023 Target Resolution Date: 10/19/2023 Goal Status: Active [name], [name] ([id number]) [phone].pdf Page 5 of 8 Interventions: Assess ulceration(Price) every visit Treatment Activities: Skin care regimen initiated : 04/19/2023 Notes: Electronic  Signature(Price) Signed: 08/23/2023 5:49:45 PM By: Shawn Stall RN, BSN Entered By: Shawn Stall on 08/23/2023 12:22:09 -------------------------------------------------------------------------------- Pain Assessment Details Patient Name: Date of Service: Karla Bigness, Karla [name]. 08/23/2023 2:45 PM Medical Record Number: [id number] Patient Account Number: [id number] Date of Birth/Sex: Treating RN: [birth year] (61 y.o. F) Primary Care Aikam Hellickson: [name] Other Clinician: Referring Glena Pharris: Treating Alyana Kreiter/Extender: [name], [name] Weeks in Treatment: 18 Active Problems Location of Pain Severity and Description of Pain Patient Has Paino Yes Site Locations Pain Location: Pain in Ulcers Rate the pain. Current Pain Level: 7 Pain Management and Medication Current Pain Management: Electronic Signature(Price) Signed: 08/23/2023 5:41:53 PM By: Thayer Dallas Entered By: Thayer Dallas on 08/23/2023 12:14:02 -------------------------------------------------------------------------------- Patient/Caregiver Education Details Patient Name: Date of Service: [name] 10/9/2024andnbsp2:45 PM Medical Record Number: [id number] Patient Account Number: [id number] Date of Birth/Gender: Treating RN: [birth year] (62 y.o. [name] Primary Care Physician: [name] Other Clinician: [name] ([id number]) [id number] Page 6 of 8 Referring Physician: Treating Physician/Extender: Juleen China Weeks in Treatment: 18 Education Assessment Education Provided To: Patient Education Topics Provided Wound/Skin Impairment: Handouts: Caring for Your Ulcer Methods: Explain/Verbal Responses: Reinforcements needed Electronic Signature(Price) Signed: 08/23/2023 5:49:45 PM By: Shawn Stall RN, BSN Entered By: Shawn Stall on 08/23/2023 12:22:22 -------------------------------------------------------------------------------- Wound  Assessment Details Patient Name: Date of Service: Karla Bigness, Karla [name]. 08/23/2023 2:45 PM Medical Record Number: [id number] Patient Account Number: [id number] Date of Birth/Sex: Treating RN: 12-08-1960 (61 y.o. F) Primary Care Marlie Kuennen: [name] Other Clinician: Referring Eddy Termine: Treating Aniyah Nobis/Extender: [name], [name] Weeks in Treatment: 18 Wound Status Wound Number: 23 Primary Diabetic Wound/Ulcer of the Lower Extremity Etiology: Wound Location: Right Metatarsal head first Wound Open Wounding Event: Gradually Appeared Status: Date Acquired: 04/18/2021 Comorbid Chronic Obstructive Pulmonary Disease (COPD), Sleep Apnea, Weeks Of Treatment: 18 History: Arrhythmia, Congestive Heart Failure, Coronary Artery Disease, Clustered Wound: No Hypertension, Peripheral Arterial Disease, Cirrhosis , Type II Diabetes, Osteoarthritis, Neuropathy Photos Wound Measurements Length: (cm) 1 Width: (cm) 0 Depth: (cm) 0 Area: (cm) Volume: (cm) % Reduction in Area: 24.9% .9 % Reduction in Volume: 70.1% .2 Epithelialization: Medium (34-66%) 0.707 Tunneling: No 0.141 Undermining: No Wound Description Classification: Grade 2 Wound Margin: Distinct, outline attached Exudate Amount: Medium Exudate Type: Serosanguineous Exudate Color: red, brown [name], [name] ([id number]) Foul Odor After Cleansing: No Slough/Fibrino Yes [phone].pdf Page 7 of 8 Wound Bed Granulation Amount: Large (67-100%) Exposed Structure Granulation Quality: Red Fascia Exposed: No Necrotic Amount: None Present (0%) Fat Layer (Subcutaneous Tissue) Exposed: Yes Tendon Exposed:  bed, cushion, seat, etc.) INTERVENTIONS - Wound Cleansing / Measurement []  - 0 Simple Wound Cleansing - one wound X- 2 5 Complex Wound Cleansing - multiple wounds X- 1 5 Wound Imaging (photographs - any number of wounds) []  - 0 Wound Tracing (instead of photographs) []  - 0 Simple Wound Measurement - one wound X- 2 5 Complex Wound Measurement - multiple wounds INTERVENTIONS - Wound Dressings []  - 0 Small Wound Dressing one or multiple wounds []  - 0 Medium Wound Dressing one or multiple wounds X- 1 20 Large Wound Dressing one or multiple wounds []  - 0 Application of Medications - topical []  - 0 Application of Medications - injection INTERVENTIONS - Miscellaneous []  - 0 External ear exam []  - 0 Specimen Collection (cultures, biopsies, blood, body fluids, etc.) []  - 0 Specimen(Price) / Culture(Price) sent or taken to Lab for analysis []  - 0 Patient Transfer (multiple staff / Nurse, adult / Similar devices) []  - 0 Simple Staple / Suture removal (25 or less) []  - 0 Complex Staple / Suture removal (26 or more) []  - 0 Hypo / Hyperglycemic Management (close monitor of Blood Glucose) [name], [name] ([id number]) [id number].pdf Page 3 of 8 []  - 0 Ankle / Brachial Index (ABI) - Karla not check if billed separately X- 1 5 Vital Signs Has the patient been seen at the hospital within the last three years: Yes Total Score: 145 Level Of Care: New/Established - Level 4 Electronic Signature(Price) Signed: 08/23/2023 5:49:45 PM By: Shawn Stall RN, BSN Entered By: Shawn Stall on 08/23/2023 13:03:31 -------------------------------------------------------------------------------- Complex / Palliative Patient Assessment Details Patient Name: Date of  Service: Karla LEY, Karla [name]. 08/23/2023 2:45 PM Medical Record Number: [id number] Patient Account Number: [id number] Date of Birth/Sex: Treating RN: Oct 24, 1961 (61 y.o. [name] Primary Care Brunilda Eble: [name] Other Clinician: Referring Puneet Masoner: Treating Ayesha Markwell/Extender: [name], [name] Weeks in Treatment: 18 Complex Wound Management Criteria Evaluation by a vascular surgeon has determined that the patient is not a revascularization candidate due to: Angiogram- 08/11/23, per vascular surgeon no other options for revascularization,high risk amputation Palliative Wound Management Criteria Care Approach Wound Care Plan: Complex Wound Management Electronic Signature(Price) Signed: 08/25/2023 10:33:32 AM By: Shawn Stall RN, BSN Signed: 09/13/2023 4:33:53 PM By: Allen Derry PA-C Entered By: Shawn Stall on 08/25/2023 07:33:32 -------------------------------------------------------------------------------- Encounter Discharge Information Details Patient Name: Date of Service: Karla Bigness, Karla [name]. 08/23/2023 2:45 PM Medical Record Number: [id number] Patient Account Number: [id number] Date of Birth/Sex: Treating RN: 01/05/1961 (61 y.o. [name] Primary Care Aveer Bartow: [name] Other Clinician: Referring Philippa Vessey: Treating Jaydeen Darley/Extender: [name], [name] Weeks in Treatment: 18 Encounter Discharge Information Items Discharge Condition: Stable Ambulatory Status: Walker Discharge Destination: Home Transportation: Private Auto Accompanied By: self Schedule Follow-up Appointment: Yes Clinical Summary of Care: Electronic Signature(Price) Signed: 08/23/2023 5:49:45 PM By: Shawn Stall RN, BSN Entered By: Shawn Stall on 08/23/2023 13:04:10 [name] ([id number]) [id number] Page 4 of 8 -------------------------------------------------------------------------------- Lower Extremity Assessment  Details Patient Name: Date of Service: [name] 08/23/2023 2:45 PM Medical Record Number: [id number] Patient Account Number: [id number] Date of Birth/Sex: Treating RN: Feb 16, 1961 (62 y.o. F) Primary Care Duward Allbritton: [name] Other Clinician: Referring Franciso Dierks: Treating Sheng Pritz/Extender: [name], [name] Weeks in Treatment: 18 Edema Assessment Assessed: [Left: No] [Right: No] Edema: [Left: Ye] [Right: Price] Calf Left: Right: Point of Measurement: 30 cm From Medial Instep 42.5 cm Ankle Left: Right: Point of Measurement: 9 cm From Medial Instep

## 2023-08-24 NOTE — Telephone Encounter (Signed)
-----   Message from [facility] [name] L sent at 08/24/2023  4:06 PM EDT ----- Regarding: refill Hey,   [facility] needs the rx for losartan please.   Thanks, Kerry Hough, EMT-Paramedic  [phone] 08/24/2023

## 2023-08-24 NOTE — Progress Notes (Signed)
Pharmacy delivered her meds today.  She needed vascepa placed in pill box for the whole week, and losartan for 2 days.  They did not bring out losartan-it needs refills from provider.  Sent message to triage asking this to be refilled.   Kerry Hough, EMT-Paramedic  [phone] 08/24/2023

## 2023-08-25 ENCOUNTER — Telehealth: Payer: Self-pay | Admitting: Family Medicine

## 2023-08-25 NOTE — Telephone Encounter (Signed)
A nurse (Ennocen?) with [facility] is calling in to report that pt's sugar is currently reading 245 and she wanted to let the doctor know.

## 2023-08-25 NOTE — Telephone Encounter (Signed)
[name]. Calling from [facility] is calling to request clarification on triamcinolone ointment (KENALOG) 0.1 % [[id number]] wanting to know how long the  Dispense Quantity: 454 g  Should last the patient? CB- [phone]  Fax- [phone]

## 2023-08-25 NOTE — Telephone Encounter (Signed)
Noted  

## 2023-08-25 NOTE — Telephone Encounter (Signed)
Med list has been faxed to number provided. 

## 2023-08-28 ENCOUNTER — Telehealth (HOSPITAL_COMMUNITY): Payer: Self-pay | Admitting: Licensed Clinical Social Worker

## 2023-08-28 ENCOUNTER — Other Ambulatory Visit: Payer: Self-pay | Admitting: *Deleted

## 2023-08-28 ENCOUNTER — Telehealth (HOSPITAL_COMMUNITY): Payer: Self-pay

## 2023-08-28 NOTE — Telephone Encounter (Addendum)
Pt aware, agreeable, and verbalized understanding   ----- Message from Jacklynn Ganong sent at 08/21/2023  5:04 PM EDT ----- Renal function improving.  Blood glucose very elevated.

## 2023-08-28 NOTE — Telephone Encounter (Signed)
Medication list has been faxed.

## 2023-08-28 NOTE — Telephone Encounter (Signed)
H&V Care Navigation CSW Progress Note  Clinical Social Worker CSW received call from pt requesting guidance with housing assistance request.  Hopeful to be accepted into new complex this week but her and her sister who live together don't have the funds to pay first months rent and security deposit which she estimates will be $1,600.  CSW evaluating options for assistance.  CSW also inquired about patient desire for counseling.  She admits to dealing with many stressors at this time including housing and being scammed by a relative but she is not sure if she wants to proceed with counseling currently- we will plan to discuss further once current issues settle down.  She is struggling with potential loss of her foot due to ulcers- this is weighing on her heavily.  Also says she received a letter from social security that she will need to start paying her Medicare part B premiums starting next month.  Pt reports only making about $1,200 a month so she should still qualify for medicare part B premiums to be covered- sounds like maybe she missed something for recertification so CSW encouraged her to apply with [employer] ASAP for Medicare Savings Program.   SDOH Screenings   Food Insecurity: No Food Insecurity (02/09/2023)  Housing: Low Risk  (02/09/2023)  Transportation Needs: No Transportation Needs (02/09/2023)  Utilities: Not At Risk (02/09/2023)  Alcohol Screen: Low Risk  (01/28/2020)  Depression (PHQ2-9): Low Risk  (02/09/2023)  Financial Resource Strain: Low Risk  (02/09/2023)  Physical Activity: Inactive (02/09/2023)  Social Connections: Unknown (08/21/2023)   Received from [facility]  Stress: No Stress Concern Present (02/09/2023)  Tobacco Use: Medium Risk (08/16/2023)    Burna Sis, LCSW Clinical Social Worker [facility] Desk#: [phone] Cell#: [phone]

## 2023-08-28 NOTE — Telephone Encounter (Signed)
Patient will need to attend apointment with provider for refill on triamcinolone ointment (KENALOG) 0.1 % [[id number]] . Patient has not been seen since 09/2022 and has missed several appointment. Patient will need to attend upcoming appointment with PCP to refill medication

## 2023-08-28 NOTE — Telephone Encounter (Signed)
Marga from [facility] called back in stating she is not sure why they didn't receive the list as that is in fact the correct fax number. She is asking if the list can either be re faxed or sent via e script somehow. Please assist further

## 2023-08-30 ENCOUNTER — Encounter (HOSPITAL_BASED_OUTPATIENT_CLINIC_OR_DEPARTMENT_OTHER): Payer: 59 | Admitting: Physician Assistant

## 2023-08-30 ENCOUNTER — Other Ambulatory Visit: Payer: Self-pay | Admitting: *Deleted

## 2023-08-30 ENCOUNTER — Other Ambulatory Visit (HOSPITAL_COMMUNITY): Payer: Self-pay

## 2023-08-30 DIAGNOSIS — I70235 Atherosclerosis of native arteries of right leg with ulceration of other part of foot: Secondary | ICD-10-CM

## 2023-08-30 DIAGNOSIS — I87331 Chronic venous hypertension (idiopathic) with ulcer and inflammation of right lower extremity: Secondary | ICD-10-CM | POA: Diagnosis not present

## 2023-08-30 NOTE — Progress Notes (Addendum)
in regard to her wound. This is actually showing signs of looking about the same I did clear a lot of callus up last week this actually looks better to me from an overall visual standpoint than last week though it is a little larger because of the callus I had removed. With that being said I feel like she is actually making really good progress here towards complete closure. No fevers, chills, nausea, vomiting, or diarrhea. 08-09-2023 upon evaluation today patient presents for follow-up concerning her ongoing issues with her right plantar foot ulcer. She is going to be having arteriogram on Friday that is just just 2 days and hopefully following this it will actually allow for more aggressive healing in regard to the right plantar foot. Today I am going to perform some debridement to clear away some of the necrotic debris. 08-23-2023 upon evaluation patient'[name] wound really appears to be doing about the same. She did have her arteriogram with Dr. Lenell [name] and that was on 9-27- 2024. Unfortunately it was noted that the patient had no good options for further revascularization. They plan to reevaluate  there in a month but she is stated to be at "high risk for major amputation". With that being said it does appear they were able to [name] stenting of the right femoral-popliteal artery but unfortunately they were unable to cross the TP trunk and proximal peroneal occlusion. Subsequently this means that there is really not good blood flow into the ankle and foot which means that the patient really does not have a good chance of getting this to heal. 08-30-2023 upon evaluation today patient appears to be doing well currently in regard to her wound. She has been tolerating the dressing changes without complication and in general I [name] feel that the patient is doing quite well at this time. Unfortunately with the blood flow being so low and really no real vascular options to improve blood flow she really has to try to stay off of this is much as possible. Electronic Signature([name]) Signed: 08/30/2023 1:56:40 PM By: Karla Derry PA-C Entered By: [name] on 08/30/2023 13:56:40 -------------------------------------------------------------------------------- Physical Exam Details Patient Name: Date of Service: [name] [name]. 08/30/2023 1:15 PM Medical Record Number: [id number] Patient Account Number: [id number] Date of Birth/Sex: Treating RN: 04/02/1961 (61 y.o. F) Primary Care Provider: Hoy [name] Other Clinician: Referring Provider: Treating Provider/Extender: [name], [name] in Treatment: 73 Constitutional Well-nourished and well-hydrated in no acute distress. Respiratory normal breathing without difficulty. Psychiatric this patient is able to make decisions and demonstrates good insight into disease process. Alert and Oriented x 3. pleasant and cooperative. Notes Based on what I am seeing I [name] believe that the patient'[name] wound is doing well no debridement of the callus needed today around the edges of the wound the surface of the wound was sprayed on plantar foot and  the leg is looking better as well I am pleased in both regards. Electronic Signature([name]) Signed: 08/30/2023 1:56:57 PM By: Karla Derry PA-C Entered By: [name] on 08/30/2023 13:56:57 -------------------------------------------------------------------------------- Physician Orders Details Patient Name: Date of Service: [name] [name]. 08/30/2023 1:15 PM Medical Record Number: [id number] Patient Account Number: [id number] Date of Birth/Sex: Treating RN: 11-30-1960 (61 y.o. [name] Primary Care Provider: Hoy [name] Other Clinician: [name], Karla ([id number]) [id number] Page 5 of 12 Referring Provider: Treating Provider/Extender: [name], [name] in Treatment: 63 Verbal / Phone Orders: No Diagnosis Coding ICD-10 Coding Code Description  [name], [name] ([id number]) [id number] Page 1 of 12 Visit Report for 08/30/2023 Chief Complaint Document Details Patient Name: Date of Service: [name] 08/30/2023 1:15 PM Medical Record Number: [id number] Patient Account Number: [id number] Date of Birth/Sex: Treating RN: 01-12-1961 (62 y.o. F) Primary Care Provider: Hoy [name] Other Clinician: Referring Provider: Treating Provider/Extender: [name], [name] in Treatment: 19 Information Obtained from: Patient Chief Complaint Bilateral LE Ulcers Electronic Signature([name]) Signed: 08/30/2023 1:55:47 PM By: Karla Derry PA-C Entered By: [name] on 08/30/2023 13:55:47 -------------------------------------------------------------------------------- HPI Details Patient Name: Date of Service: [name] [name]. 08/30/2023 1:15 PM Medical Record Number: [id number] Patient Account Number: [id number] Date of Birth/Sex: Treating RN: 1961-03-04 (61 y.o. F) Primary Care Provider: Hoy [name] Other Clinician: Referring Provider: Treating Provider/Extender: [name], [name] in Treatment: 19 History of Present Illness HPI Description: This 62 year old patient who has a very long significant history of diabetes mellitus, previous alcohol and nicotine abuse, chronic data disease, COPD, diabetes mellitus, height hypertension, critical lower limb ischemia with several wounds being managed at the wound Center at [facility] for over a year. She was recently in the ER at [facility] and was referred to our center. He has had a long history of critical limb ischemia and over a period of time has had balloon angioplasties in March 2016, endarterectomy of the left carotid, lower extremity angiogram and treatment by Dr. Nanetta [name], several cardiac catheterizations. Most recently she had an x-ray of her left foot while in the ER which showed no acute  abnormality. during this ER visit she was started on ciprofloxacin and asked to continue with the wound care physicians at [name]. Her last ABI done in June 2017 showed the right side to be 0.28 in the left side to be 0.48. Her right toe brachial index was 0.17 on the right and 0.27 on the left. her last hemoglobin A1c was 11.1% She continues to smoke about a pack of cigarettes a day. 03/28/2017 -- -- right foot x-ray -- IMPRESSION:Areas of soft tissue swelling. Mild subluxation second PIP joint. No frank dislocation or fracture. No erosive change or bony destruction. No soft tissue ulceration or radiopaque foreign body evident by radiography. There is plantar fascia calcification with a nearby inferior calcaneal spur. There are foci of arterial vascular calcification. 04/04/2017 -- the patient continues to be noncompliant and continues to complain of a lot of pain and has not done anything about quitting smoking Readmission: 06/26/18 on evaluation today patient presents for reevaluation she has not been seen in our office since May 2018. Since she was last seen here in our office she has undergone testing at Dr. Durenda Age office where it was revealed that she had an ABI of 0.68 with her previous ABI being 0.64 and a left ABI of 0.38 with previous ABI being 0.34 this study which was performed on 04/05/18 was pretty much equivalent to the study performed on 11/22/17. The findings in the end revealed on the right would appear to be moderate right lower extremity arterial disease on the left Dr. Allyson Sabal states moderate in the report but unfortunately this appears to be much more severe at 0.38 compared to the right. Subsequently the patient was scheduled to have angiography with Dr. Allyson Sabal on 04/12/18. This was however canceled due to the fact that the patient was found to have chronic kidney disease stage III and it was to the point that he did not  x Per Week/30 Days Discharge Instructions: Apply moisturizing lotion as directed Prim Dressing: IODOFLEX 0.9% Cadexomer Iodine Pad 4x6 cm 3 x Per Week/30 Days ary Discharge Instructions: *****OR IODOSORB****Apply to wound bed as instructed Secondary Dressing: ABD Pad, 8x10 3 x Per Week/30 Days Discharge Instructions: Apply over primary dressing as directed. Secured With: [employer], 4.5x3.1 (in/yd) 3 x Per Week/30 Days Discharge Instructions: Secure with Kerlix as directed. Secured With: 39M Medipore H Soft Cloth Surgical T ape, 4 x 10 (in/yd) 3 x Per Week/30 Days Discharge Instructions: Secure with tape as directed. Compression Wrap: tubigrip size E single layer 3 x Per Week/30 Days Discharge Instructions: apply in the morning and remove at night. Patient Medications llergies: gabapentin, Lyrica A Notifications Medication Indication Start End 08/30/2023 lidocaine DOSE topical 5 % ointment - ointment topical once daily Electronic Signature([name]) Signed: 08/30/2023 4:26:00 PM By: Karla Derry PA-C Signed: 09/11/2023 4:05:37 PM By: Redmond Pulling RN, BSN Entered By: Redmond Pulling on  08/30/2023 13:54:20 -------------------------------------------------------------------------------- Problem List Details Patient Name: Date of Service: [name] [name]. 08/30/2023 1:15 PM Medical Record Number: [id number] Patient Account Number: [id number] Date of Birth/Sex: Treating RN: 1961/08/08 (61 y.o. F) Primary Care Provider: Hoy [name] Other Clinician: Referring Provider: Treating Provider/Extender: [name], [name] in Treatment: 19 Active Problems ICD-10 Encounter Code Description Active Date MDM Diagnosis 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 2 diabetes mellitus with foot ulcer 04/19/2023 No Yes L97.512 Non-pressure chronic ulcer of other part of right foot with fat layer exposed 04/19/2023 No Yes L97.522 Non-pressure chronic ulcer of other part of left foot with fat layer exposed 04/19/2023 No Yes E11.622 Type 2 diabetes mellitus with other skin ulcer 04/19/2023 No Yes [name], [name] ([id number]) [id number] Page 7 of 12 [id number] Non-pressure chronic ulcer of other part of right lower leg with fat layer 04/19/2023 No Yes exposed I73.89 Other specified peripheral vascular diseases 04/19/2023 No Yes I87.331 Chronic venous hypertension (idiopathic) with ulcer and inflammation of right 04/19/2023 No Yes lower extremity Inactive Problems Resolved Problems Electronic Signature([name]) Signed: 08/30/2023 1:48:37 PM By: Karla Derry PA-C Entered By: [name] on 08/30/2023 13:48:37 -------------------------------------------------------------------------------- Progress Note Details Patient Name: Date of Service: [name] [name]. 08/30/2023 1:15 PM Medical Record Number: [id number] Patient Account Number: [id number] Date of Birth/Sex: Treating RN: 10-09-1961 (61 y.o. F) Primary Care Provider: Hoy [name] Other Clinician: Referring Provider: Treating Provider/Extender: [name], [name] in Treatment: 37 Subjective Chief  Complaint Information obtained from Patient Bilateral LE Ulcers History of Present Illness (HPI) This 62 year old patient who has a very long significant history of diabetes mellitus, previous alcohol and nicotine abuse, chronic data disease, COPD, diabetes mellitus, height hypertension, critical lower limb ischemia with several wounds being managed at the wound Center at [facility] for over a year. She was recently in the ER at [facility] and was referred to our center. He has had a long history of critical limb ischemia and over a period of time has had balloon angioplasties in March 2016, endarterectomy of the left carotid, lower extremity angiogram and treatment by Dr. Nanetta [name], several cardiac catheterizations. Most recently she had an x-ray of her left foot while in the ER which showed no acute abnormality. during this ER visit she was started on ciprofloxacin and asked to continue with the wound care physicians at [facility]. Her last ABI done in June 2017 showed the right side to be 0.28 in the left side to be 0.48. Her right toe  [name], [name] ([id number]) [id number] Page 1 of 12 Visit Report for 08/30/2023 Chief Complaint Document Details Patient Name: Date of Service: [name] 08/30/2023 1:15 PM Medical Record Number: [id number] Patient Account Number: [id number] Date of Birth/Sex: Treating RN: 01-12-1961 (62 y.o. F) Primary Care Provider: Hoy [name] Other Clinician: Referring Provider: Treating Provider/Extender: [name], [name] in Treatment: 19 Information Obtained from: Patient Chief Complaint Bilateral LE Ulcers Electronic Signature([name]) Signed: 08/30/2023 1:55:47 PM By: Karla Derry PA-C Entered By: [name] on 08/30/2023 13:55:47 -------------------------------------------------------------------------------- HPI Details Patient Name: Date of Service: [name] [name]. 08/30/2023 1:15 PM Medical Record Number: [id number] Patient Account Number: [id number] Date of Birth/Sex: Treating RN: 1961-03-04 (61 y.o. F) Primary Care Provider: Hoy [name] Other Clinician: Referring Provider: Treating Provider/Extender: [name], [name] in Treatment: 19 History of Present Illness HPI Description: This 62 year old patient who has a very long significant history of diabetes mellitus, previous alcohol and nicotine abuse, chronic data disease, COPD, diabetes mellitus, height hypertension, critical lower limb ischemia with several wounds being managed at the wound Center at [facility] for over a year. She was recently in the ER at [facility] and was referred to our center. He has had a long history of critical limb ischemia and over a period of time has had balloon angioplasties in March 2016, endarterectomy of the left carotid, lower extremity angiogram and treatment by Dr. Nanetta [name], several cardiac catheterizations. Most recently she had an x-ray of her left foot while in the ER which showed no acute  abnormality. during this ER visit she was started on ciprofloxacin and asked to continue with the wound care physicians at [name]. Her last ABI done in June 2017 showed the right side to be 0.28 in the left side to be 0.48. Her right toe brachial index was 0.17 on the right and 0.27 on the left. her last hemoglobin A1c was 11.1% She continues to smoke about a pack of cigarettes a day. 03/28/2017 -- -- right foot x-ray -- IMPRESSION:Areas of soft tissue swelling. Mild subluxation second PIP joint. No frank dislocation or fracture. No erosive change or bony destruction. No soft tissue ulceration or radiopaque foreign body evident by radiography. There is plantar fascia calcification with a nearby inferior calcaneal spur. There are foci of arterial vascular calcification. 04/04/2017 -- the patient continues to be noncompliant and continues to complain of a lot of pain and has not done anything about quitting smoking Readmission: 06/26/18 on evaluation today patient presents for reevaluation she has not been seen in our office since May 2018. Since she was last seen here in our office she has undergone testing at Dr. Durenda Age office where it was revealed that she had an ABI of 0.68 with her previous ABI being 0.64 and a left ABI of 0.38 with previous ABI being 0.34 this study which was performed on 04/05/18 was pretty much equivalent to the study performed on 11/22/17. The findings in the end revealed on the right would appear to be moderate right lower extremity arterial disease on the left Dr. Allyson Sabal states moderate in the report but unfortunately this appears to be much more severe at 0.38 compared to the right. Subsequently the patient was scheduled to have angiography with Dr. Allyson Sabal on 04/12/18. This was however canceled due to the fact that the patient was found to have chronic kidney disease stage III and it was to the point that he did not  [name], [name] ([id number]) [id number] Page 1 of 12 Visit Report for 08/30/2023 Chief Complaint Document Details Patient Name: Date of Service: [name] 08/30/2023 1:15 PM Medical Record Number: [id number] Patient Account Number: [id number] Date of Birth/Sex: Treating RN: 01-12-1961 (62 y.o. F) Primary Care Provider: Hoy [name] Other Clinician: Referring Provider: Treating Provider/Extender: [name], [name] in Treatment: 19 Information Obtained from: Patient Chief Complaint Bilateral LE Ulcers Electronic Signature([name]) Signed: 08/30/2023 1:55:47 PM By: Karla Derry PA-C Entered By: [name] on 08/30/2023 13:55:47 -------------------------------------------------------------------------------- HPI Details Patient Name: Date of Service: [name] [name]. 08/30/2023 1:15 PM Medical Record Number: [id number] Patient Account Number: [id number] Date of Birth/Sex: Treating RN: 1961-03-04 (61 y.o. F) Primary Care Provider: Hoy [name] Other Clinician: Referring Provider: Treating Provider/Extender: [name], [name] in Treatment: 19 History of Present Illness HPI Description: This 62 year old patient who has a very long significant history of diabetes mellitus, previous alcohol and nicotine abuse, chronic data disease, COPD, diabetes mellitus, height hypertension, critical lower limb ischemia with several wounds being managed at the wound Center at [facility] for over a year. She was recently in the ER at [facility] and was referred to our center. He has had a long history of critical limb ischemia and over a period of time has had balloon angioplasties in March 2016, endarterectomy of the left carotid, lower extremity angiogram and treatment by Dr. Nanetta [name], several cardiac catheterizations. Most recently she had an x-ray of her left foot while in the ER which showed no acute  abnormality. during this ER visit she was started on ciprofloxacin and asked to continue with the wound care physicians at [name]. Her last ABI done in June 2017 showed the right side to be 0.28 in the left side to be 0.48. Her right toe brachial index was 0.17 on the right and 0.27 on the left. her last hemoglobin A1c was 11.1% She continues to smoke about a pack of cigarettes a day. 03/28/2017 -- -- right foot x-ray -- IMPRESSION:Areas of soft tissue swelling. Mild subluxation second PIP joint. No frank dislocation or fracture. No erosive change or bony destruction. No soft tissue ulceration or radiopaque foreign body evident by radiography. There is plantar fascia calcification with a nearby inferior calcaneal spur. There are foci of arterial vascular calcification. 04/04/2017 -- the patient continues to be noncompliant and continues to complain of a lot of pain and has not done anything about quitting smoking Readmission: 06/26/18 on evaluation today patient presents for reevaluation she has not been seen in our office since May 2018. Since she was last seen here in our office she has undergone testing at Dr. Durenda Age office where it was revealed that she had an ABI of 0.68 with her previous ABI being 0.64 and a left ABI of 0.38 with previous ABI being 0.34 this study which was performed on 04/05/18 was pretty much equivalent to the study performed on 11/22/17. The findings in the end revealed on the right would appear to be moderate right lower extremity arterial disease on the left Dr. Allyson Sabal states moderate in the report but unfortunately this appears to be much more severe at 0.38 compared to the right. Subsequently the patient was scheduled to have angiography with Dr. Allyson Sabal on 04/12/18. This was however canceled due to the fact that the patient was found to have chronic kidney disease stage III and it was to the point that he did not  x Per Week/30 Days Discharge Instructions: Apply moisturizing lotion as directed Prim Dressing: IODOFLEX 0.9% Cadexomer Iodine Pad 4x6 cm 3 x Per Week/30 Days ary Discharge Instructions: *****OR IODOSORB****Apply to wound bed as instructed Secondary Dressing: ABD Pad, 8x10 3 x Per Week/30 Days Discharge Instructions: Apply over primary dressing as directed. Secured With: [employer], 4.5x3.1 (in/yd) 3 x Per Week/30 Days Discharge Instructions: Secure with Kerlix as directed. Secured With: 39M Medipore H Soft Cloth Surgical T ape, 4 x 10 (in/yd) 3 x Per Week/30 Days Discharge Instructions: Secure with tape as directed. Compression Wrap: tubigrip size E single layer 3 x Per Week/30 Days Discharge Instructions: apply in the morning and remove at night. Patient Medications llergies: gabapentin, Lyrica A Notifications Medication Indication Start End 08/30/2023 lidocaine DOSE topical 5 % ointment - ointment topical once daily Electronic Signature([name]) Signed: 08/30/2023 4:26:00 PM By: Karla Derry PA-C Signed: 09/11/2023 4:05:37 PM By: Redmond Pulling RN, BSN Entered By: Redmond Pulling on  08/30/2023 13:54:20 -------------------------------------------------------------------------------- Problem List Details Patient Name: Date of Service: [name] [name]. 08/30/2023 1:15 PM Medical Record Number: [id number] Patient Account Number: [id number] Date of Birth/Sex: Treating RN: 1961/08/08 (61 y.o. F) Primary Care Provider: Hoy [name] Other Clinician: Referring Provider: Treating Provider/Extender: [name], [name] in Treatment: 19 Active Problems ICD-10 Encounter Code Description Active Date MDM Diagnosis 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 2 diabetes mellitus with foot ulcer 04/19/2023 No Yes L97.512 Non-pressure chronic ulcer of other part of right foot with fat layer exposed 04/19/2023 No Yes L97.522 Non-pressure chronic ulcer of other part of left foot with fat layer exposed 04/19/2023 No Yes E11.622 Type 2 diabetes mellitus with other skin ulcer 04/19/2023 No Yes [name], [name] ([id number]) [id number] Page 7 of 12 [id number] Non-pressure chronic ulcer of other part of right lower leg with fat layer 04/19/2023 No Yes exposed I73.89 Other specified peripheral vascular diseases 04/19/2023 No Yes I87.331 Chronic venous hypertension (idiopathic) with ulcer and inflammation of right 04/19/2023 No Yes lower extremity Inactive Problems Resolved Problems Electronic Signature([name]) Signed: 08/30/2023 1:48:37 PM By: Karla Derry PA-C Entered By: [name] on 08/30/2023 13:48:37 -------------------------------------------------------------------------------- Progress Note Details Patient Name: Date of Service: [name] [name]. 08/30/2023 1:15 PM Medical Record Number: [id number] Patient Account Number: [id number] Date of Birth/Sex: Treating RN: 10-09-1961 (61 y.o. F) Primary Care Provider: Hoy [name] Other Clinician: Referring Provider: Treating Provider/Extender: [name], [name] in Treatment: 37 Subjective Chief  Complaint Information obtained from Patient Bilateral LE Ulcers History of Present Illness (HPI) This 62 year old patient who has a very long significant history of diabetes mellitus, previous alcohol and nicotine abuse, chronic data disease, COPD, diabetes mellitus, height hypertension, critical lower limb ischemia with several wounds being managed at the wound Center at [facility] for over a year. She was recently in the ER at [facility] and was referred to our center. He has had a long history of critical limb ischemia and over a period of time has had balloon angioplasties in March 2016, endarterectomy of the left carotid, lower extremity angiogram and treatment by Dr. Nanetta [name], several cardiac catheterizations. Most recently she had an x-ray of her left foot while in the ER which showed no acute abnormality. during this ER visit she was started on ciprofloxacin and asked to continue with the wound care physicians at [facility]. Her last ABI done in June 2017 showed the right side to be 0.28 in the left side to be 0.48. Her right toe  in regard to her wound. This is actually showing signs of looking about the same I did clear a lot of callus up last week this actually looks better to me from an overall visual standpoint than last week though it is a little larger because of the callus I had removed. With that being said I feel like she is actually making really good progress here towards complete closure. No fevers, chills, nausea, vomiting, or diarrhea. 08-09-2023 upon evaluation today patient presents for follow-up concerning her ongoing issues with her right plantar foot ulcer. She is going to be having arteriogram on Friday that is just just 2 days and hopefully following this it will actually allow for more aggressive healing in regard to the right plantar foot. Today I am going to perform some debridement to clear away some of the necrotic debris. 08-23-2023 upon evaluation patient'[name] wound really appears to be doing about the same. She did have her arteriogram with Dr. Lenell [name] and that was on 9-27- 2024. Unfortunately it was noted that the patient had no good options for further revascularization. They plan to reevaluate  there in a month but she is stated to be at "high risk for major amputation". With that being said it does appear they were able to [name] stenting of the right femoral-popliteal artery but unfortunately they were unable to cross the TP trunk and proximal peroneal occlusion. Subsequently this means that there is really not good blood flow into the ankle and foot which means that the patient really does not have a good chance of getting this to heal. 08-30-2023 upon evaluation today patient appears to be doing well currently in regard to her wound. She has been tolerating the dressing changes without complication and in general I [name] feel that the patient is doing quite well at this time. Unfortunately with the blood flow being so low and really no real vascular options to improve blood flow she really has to try to stay off of this is much as possible. Electronic Signature([name]) Signed: 08/30/2023 1:56:40 PM By: Karla Derry PA-C Entered By: [name] on 08/30/2023 13:56:40 -------------------------------------------------------------------------------- Physical Exam Details Patient Name: Date of Service: [name] [name]. 08/30/2023 1:15 PM Medical Record Number: [id number] Patient Account Number: [id number] Date of Birth/Sex: Treating RN: 04/02/1961 (61 y.o. F) Primary Care Provider: Hoy [name] Other Clinician: Referring Provider: Treating Provider/Extender: [name], [name] in Treatment: 73 Constitutional Well-nourished and well-hydrated in no acute distress. Respiratory normal breathing without difficulty. Psychiatric this patient is able to make decisions and demonstrates good insight into disease process. Alert and Oriented x 3. pleasant and cooperative. Notes Based on what I am seeing I [name] believe that the patient'[name] wound is doing well no debridement of the callus needed today around the edges of the wound the surface of the wound was sprayed on plantar foot and  the leg is looking better as well I am pleased in both regards. Electronic Signature([name]) Signed: 08/30/2023 1:56:57 PM By: Karla Derry PA-C Entered By: [name] on 08/30/2023 13:56:57 -------------------------------------------------------------------------------- Physician Orders Details Patient Name: Date of Service: [name] [name]. 08/30/2023 1:15 PM Medical Record Number: [id number] Patient Account Number: [id number] Date of Birth/Sex: Treating RN: 11-30-1960 (61 y.o. [name] Primary Care Provider: Hoy [name] Other Clinician: [name], Karla ([id number]) [id number] Page 5 of 12 Referring Provider: Treating Provider/Extender: [name], [name] in Treatment: 63 Verbal / Phone Orders: No Diagnosis Coding ICD-10 Coding Code Description  x Per Week/30 Days Discharge Instructions: Apply moisturizing lotion as directed Prim Dressing: IODOFLEX 0.9% Cadexomer Iodine Pad 4x6 cm 3 x Per Week/30 Days ary Discharge Instructions: *****OR IODOSORB****Apply to wound bed as instructed Secondary Dressing: ABD Pad, 8x10 3 x Per Week/30 Days Discharge Instructions: Apply over primary dressing as directed. Secured With: [employer], 4.5x3.1 (in/yd) 3 x Per Week/30 Days Discharge Instructions: Secure with Kerlix as directed. Secured With: 39M Medipore H Soft Cloth Surgical T ape, 4 x 10 (in/yd) 3 x Per Week/30 Days Discharge Instructions: Secure with tape as directed. Compression Wrap: tubigrip size E single layer 3 x Per Week/30 Days Discharge Instructions: apply in the morning and remove at night. Patient Medications llergies: gabapentin, Lyrica A Notifications Medication Indication Start End 08/30/2023 lidocaine DOSE topical 5 % ointment - ointment topical once daily Electronic Signature([name]) Signed: 08/30/2023 4:26:00 PM By: Karla Derry PA-C Signed: 09/11/2023 4:05:37 PM By: Redmond Pulling RN, BSN Entered By: Redmond Pulling on  08/30/2023 13:54:20 -------------------------------------------------------------------------------- Problem List Details Patient Name: Date of Service: [name] [name]. 08/30/2023 1:15 PM Medical Record Number: [id number] Patient Account Number: [id number] Date of Birth/Sex: Treating RN: 1961/08/08 (61 y.o. F) Primary Care Provider: Hoy [name] Other Clinician: Referring Provider: Treating Provider/Extender: [name], [name] in Treatment: 19 Active Problems ICD-10 Encounter Code Description Active Date MDM Diagnosis 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 2 diabetes mellitus with foot ulcer 04/19/2023 No Yes L97.512 Non-pressure chronic ulcer of other part of right foot with fat layer exposed 04/19/2023 No Yes L97.522 Non-pressure chronic ulcer of other part of left foot with fat layer exposed 04/19/2023 No Yes E11.622 Type 2 diabetes mellitus with other skin ulcer 04/19/2023 No Yes [name], [name] ([id number]) [id number] Page 7 of 12 [id number] Non-pressure chronic ulcer of other part of right lower leg with fat layer 04/19/2023 No Yes exposed I73.89 Other specified peripheral vascular diseases 04/19/2023 No Yes I87.331 Chronic venous hypertension (idiopathic) with ulcer and inflammation of right 04/19/2023 No Yes lower extremity Inactive Problems Resolved Problems Electronic Signature([name]) Signed: 08/30/2023 1:48:37 PM By: Karla Derry PA-C Entered By: [name] on 08/30/2023 13:48:37 -------------------------------------------------------------------------------- Progress Note Details Patient Name: Date of Service: [name] [name]. 08/30/2023 1:15 PM Medical Record Number: [id number] Patient Account Number: [id number] Date of Birth/Sex: Treating RN: 10-09-1961 (61 y.o. F) Primary Care Provider: Hoy [name] Other Clinician: Referring Provider: Treating Provider/Extender: [name], [name] in Treatment: 37 Subjective Chief  Complaint Information obtained from Patient Bilateral LE Ulcers History of Present Illness (HPI) This 62 year old patient who has a very long significant history of diabetes mellitus, previous alcohol and nicotine abuse, chronic data disease, COPD, diabetes mellitus, height hypertension, critical lower limb ischemia with several wounds being managed at the wound Center at [facility] for over a year. She was recently in the ER at [facility] and was referred to our center. He has had a long history of critical limb ischemia and over a period of time has had balloon angioplasties in March 2016, endarterectomy of the left carotid, lower extremity angiogram and treatment by Dr. Nanetta [name], several cardiac catheterizations. Most recently she had an x-ray of her left foot while in the ER which showed no acute abnormality. during this ER visit she was started on ciprofloxacin and asked to continue with the wound care physicians at [facility]. Her last ABI done in June 2017 showed the right side to be 0.28 in the left side to be 0.48. Her right toe  in regard to her wound. This is actually showing signs of looking about the same I did clear a lot of callus up last week this actually looks better to me from an overall visual standpoint than last week though it is a little larger because of the callus I had removed. With that being said I feel like she is actually making really good progress here towards complete closure. No fevers, chills, nausea, vomiting, or diarrhea. 08-09-2023 upon evaluation today patient presents for follow-up concerning her ongoing issues with her right plantar foot ulcer. She is going to be having arteriogram on Friday that is just just 2 days and hopefully following this it will actually allow for more aggressive healing in regard to the right plantar foot. Today I am going to perform some debridement to clear away some of the necrotic debris. 08-23-2023 upon evaluation patient'[name] wound really appears to be doing about the same. She did have her arteriogram with Dr. Lenell [name] and that was on 9-27- 2024. Unfortunately it was noted that the patient had no good options for further revascularization. They plan to reevaluate  there in a month but she is stated to be at "high risk for major amputation". With that being said it does appear they were able to [name] stenting of the right femoral-popliteal artery but unfortunately they were unable to cross the TP trunk and proximal peroneal occlusion. Subsequently this means that there is really not good blood flow into the ankle and foot which means that the patient really does not have a good chance of getting this to heal. 08-30-2023 upon evaluation today patient appears to be doing well currently in regard to her wound. She has been tolerating the dressing changes without complication and in general I [name] feel that the patient is doing quite well at this time. Unfortunately with the blood flow being so low and really no real vascular options to improve blood flow she really has to try to stay off of this is much as possible. Electronic Signature([name]) Signed: 08/30/2023 1:56:40 PM By: Karla Derry PA-C Entered By: [name] on 08/30/2023 13:56:40 -------------------------------------------------------------------------------- Physical Exam Details Patient Name: Date of Service: [name] [name]. 08/30/2023 1:15 PM Medical Record Number: [id number] Patient Account Number: [id number] Date of Birth/Sex: Treating RN: 04/02/1961 (61 y.o. F) Primary Care Provider: Hoy [name] Other Clinician: Referring Provider: Treating Provider/Extender: [name], [name] in Treatment: 73 Constitutional Well-nourished and well-hydrated in no acute distress. Respiratory normal breathing without difficulty. Psychiatric this patient is able to make decisions and demonstrates good insight into disease process. Alert and Oriented x 3. pleasant and cooperative. Notes Based on what I am seeing I [name] believe that the patient'[name] wound is doing well no debridement of the callus needed today around the edges of the wound the surface of the wound was sprayed on plantar foot and  the leg is looking better as well I am pleased in both regards. Electronic Signature([name]) Signed: 08/30/2023 1:56:57 PM By: Karla Derry PA-C Entered By: [name] on 08/30/2023 13:56:57 -------------------------------------------------------------------------------- Physician Orders Details Patient Name: Date of Service: [name] [name]. 08/30/2023 1:15 PM Medical Record Number: [id number] Patient Account Number: [id number] Date of Birth/Sex: Treating RN: 11-30-1960 (61 y.o. [name] Primary Care Provider: Hoy [name] Other Clinician: [name], Karla ([id number]) [id number] Page 5 of 12 Referring Provider: Treating Provider/Extender: [name], [name] in Treatment: 63 Verbal / Phone Orders: No Diagnosis Coding ICD-10 Coding Code Description  in regard to her wound. This is actually showing signs of looking about the same I did clear a lot of callus up last week this actually looks better to me from an overall visual standpoint than last week though it is a little larger because of the callus I had removed. With that being said I feel like she is actually making really good progress here towards complete closure. No fevers, chills, nausea, vomiting, or diarrhea. 08-09-2023 upon evaluation today patient presents for follow-up concerning her ongoing issues with her right plantar foot ulcer. She is going to be having arteriogram on Friday that is just just 2 days and hopefully following this it will actually allow for more aggressive healing in regard to the right plantar foot. Today I am going to perform some debridement to clear away some of the necrotic debris. 08-23-2023 upon evaluation patient'[name] wound really appears to be doing about the same. She did have her arteriogram with Dr. Lenell [name] and that was on 9-27- 2024. Unfortunately it was noted that the patient had no good options for further revascularization. They plan to reevaluate  there in a month but she is stated to be at "high risk for major amputation". With that being said it does appear they were able to [name] stenting of the right femoral-popliteal artery but unfortunately they were unable to cross the TP trunk and proximal peroneal occlusion. Subsequently this means that there is really not good blood flow into the ankle and foot which means that the patient really does not have a good chance of getting this to heal. 08-30-2023 upon evaluation today patient appears to be doing well currently in regard to her wound. She has been tolerating the dressing changes without complication and in general I [name] feel that the patient is doing quite well at this time. Unfortunately with the blood flow being so low and really no real vascular options to improve blood flow she really has to try to stay off of this is much as possible. Electronic Signature([name]) Signed: 08/30/2023 1:56:40 PM By: Karla Derry PA-C Entered By: [name] on 08/30/2023 13:56:40 -------------------------------------------------------------------------------- Physical Exam Details Patient Name: Date of Service: [name] [name]. 08/30/2023 1:15 PM Medical Record Number: [id number] Patient Account Number: [id number] Date of Birth/Sex: Treating RN: 04/02/1961 (61 y.o. F) Primary Care Provider: Hoy [name] Other Clinician: Referring Provider: Treating Provider/Extender: [name], [name] in Treatment: 73 Constitutional Well-nourished and well-hydrated in no acute distress. Respiratory normal breathing without difficulty. Psychiatric this patient is able to make decisions and demonstrates good insight into disease process. Alert and Oriented x 3. pleasant and cooperative. Notes Based on what I am seeing I [name] believe that the patient'[name] wound is doing well no debridement of the callus needed today around the edges of the wound the surface of the wound was sprayed on plantar foot and  the leg is looking better as well I am pleased in both regards. Electronic Signature([name]) Signed: 08/30/2023 1:56:57 PM By: Karla Derry PA-C Entered By: [name] on 08/30/2023 13:56:57 -------------------------------------------------------------------------------- Physician Orders Details Patient Name: Date of Service: [name] [name]. 08/30/2023 1:15 PM Medical Record Number: [id number] Patient Account Number: [id number] Date of Birth/Sex: Treating RN: 11-30-1960 (61 y.o. [name] Primary Care Provider: Hoy [name] Other Clinician: [name], Karla ([id number]) [id number] Page 5 of 12 Referring Provider: Treating Provider/Extender: [name], [name] in Treatment: 63 Verbal / Phone Orders: No Diagnosis Coding ICD-10 Coding Code Description  in regard to her wound. This is actually showing signs of looking about the same I did clear a lot of callus up last week this actually looks better to me from an overall visual standpoint than last week though it is a little larger because of the callus I had removed. With that being said I feel like she is actually making really good progress here towards complete closure. No fevers, chills, nausea, vomiting, or diarrhea. 08-09-2023 upon evaluation today patient presents for follow-up concerning her ongoing issues with her right plantar foot ulcer. She is going to be having arteriogram on Friday that is just just 2 days and hopefully following this it will actually allow for more aggressive healing in regard to the right plantar foot. Today I am going to perform some debridement to clear away some of the necrotic debris. 08-23-2023 upon evaluation patient'[name] wound really appears to be doing about the same. She did have her arteriogram with Dr. Lenell [name] and that was on 9-27- 2024. Unfortunately it was noted that the patient had no good options for further revascularization. They plan to reevaluate  there in a month but she is stated to be at "high risk for major amputation". With that being said it does appear they were able to [name] stenting of the right femoral-popliteal artery but unfortunately they were unable to cross the TP trunk and proximal peroneal occlusion. Subsequently this means that there is really not good blood flow into the ankle and foot which means that the patient really does not have a good chance of getting this to heal. 08-30-2023 upon evaluation today patient appears to be doing well currently in regard to her wound. She has been tolerating the dressing changes without complication and in general I [name] feel that the patient is doing quite well at this time. Unfortunately with the blood flow being so low and really no real vascular options to improve blood flow she really has to try to stay off of this is much as possible. Electronic Signature([name]) Signed: 08/30/2023 1:56:40 PM By: Karla Derry PA-C Entered By: [name] on 08/30/2023 13:56:40 -------------------------------------------------------------------------------- Physical Exam Details Patient Name: Date of Service: [name] [name]. 08/30/2023 1:15 PM Medical Record Number: [id number] Patient Account Number: [id number] Date of Birth/Sex: Treating RN: 04/02/1961 (61 y.o. F) Primary Care Provider: Hoy [name] Other Clinician: Referring Provider: Treating Provider/Extender: [name], [name] in Treatment: 73 Constitutional Well-nourished and well-hydrated in no acute distress. Respiratory normal breathing without difficulty. Psychiatric this patient is able to make decisions and demonstrates good insight into disease process. Alert and Oriented x 3. pleasant and cooperative. Notes Based on what I am seeing I [name] believe that the patient'[name] wound is doing well no debridement of the callus needed today around the edges of the wound the surface of the wound was sprayed on plantar foot and  the leg is looking better as well I am pleased in both regards. Electronic Signature([name]) Signed: 08/30/2023 1:56:57 PM By: Karla Derry PA-C Entered By: [name] on 08/30/2023 13:56:57 -------------------------------------------------------------------------------- Physician Orders Details Patient Name: Date of Service: [name] [name]. 08/30/2023 1:15 PM Medical Record Number: [id number] Patient Account Number: [id number] Date of Birth/Sex: Treating RN: 11-30-1960 (61 y.o. [name] Primary Care Provider: Hoy [name] Other Clinician: [name], Karla ([id number]) [id number] Page 5 of 12 Referring Provider: Treating Provider/Extender: [name], [name] in Treatment: 63 Verbal / Phone Orders: No Diagnosis Coding ICD-10 Coding Code Description  foot this is gena be a little bit more problematic with the poor blood flow. Nonetheless I discussed continuing with the Iodoflex which I think is doing a good job here. 3. Muscle can recommend that the patient should continue to monitor for any signs of infection or worsening if anything changes she has contact the office let me know. We will see patient back for reevaluation in 1 week here in the clinic. If anything worsens or changes patient will contact  our office for additional recommendations. Electronic Signature([name]) Signed: 08/30/2023 1:57:32 PM By: Karla Derry PA-C Entered By: [name] on 08/30/2023 13:57:32 [name] ([id number]) [id number] Page 12 of 12 -------------------------------------------------------------------------------- SuperBill Details Patient Name: Date of Service: [name] 08/30/2023 Medical Record Number: [id number] Patient Account Number: [id number] Date of Birth/Sex: Treating RN: 1960/12/14 (62 y.o. 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 L97.522 Non-pressure chronic ulcer of other part of left foot with fat layer exposed E11.622 Type 2 diabetes mellitus with other skin ulcer L97.812 Non-pressure chronic ulcer of other part of right lower leg with fat layer exposed I73.89 Other specified peripheral vascular diseases I87.331 Chronic venous hypertension (idiopathic) with ulcer and inflammation of right lower extremity Facility Procedures : CPT4 Code: [id number] Description: 99213 - WOUND CARE VISIT-LEV 3 EST PT Modifier: Quantity: 1 Physician Procedures : CPT4 Code Description Modifier [id number] 99213 - WC PHYS LEVEL 3 - EST PT ICD-10 Diagnosis Description 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 This is actually showing signs of looking about the same I did clear a lot of callus up last week this actually looks better to me from an overall visual standpoint than last week though it is a little larger because of the callus I had removed. With that being said I feel like she is actually making really good progress here towards complete closure. No fevers, chills, nausea, vomiting, or diarrhea. 08-09-2023 upon evaluation today patient presents for follow-up concerning her ongoing issues with her right plantar foot ulcer. She is going to be having arteriogram on Friday that is just just 2 days and hopefully following this it will actually allow for more aggressive healing in regard to the right plantar foot. Today I am going to perform some debridement to clear away some of the necrotic debris. 08-23-2023 upon evaluation patient'[name] wound really appears to be doing about the same. She did have her arteriogram with Dr. Lenell [name] and that was on 9-27- 2024. Unfortunately it was noted that the patient had no good options for further revascularization. They plan to reevaluate  there in a month but she is stated to be at "high risk for major amputation". With that being said it does appear they were able to [name] stenting of the right femoral-popliteal artery but unfortunately they were unable to cross the TP trunk and proximal peroneal occlusion. Subsequently this means that there is really not good blood flow into the ankle and foot which means that the patient really does not have a good chance of getting this to heal. 08-30-2023 upon evaluation today patient appears to be doing well currently in regard to her wound. She has been tolerating the dressing changes without complication and in general I [name] feel that the patient is doing quite well at this time. Unfortunately with the blood flow being so low and really no real vascular options to improve blood flow she really has to try to stay off of this is much as possible. Electronic Signature([name]) Signed: 08/30/2023 1:56:40 PM By: Karla Derry PA-C Entered By: [name] on 08/30/2023 13:56:40 -------------------------------------------------------------------------------- Physical Exam Details Patient Name: Date of Service: [name] [name]. 08/30/2023 1:15 PM Medical Record Number: [id number] Patient Account Number: [id number] Date of Birth/Sex: Treating RN: 04/02/1961 (61 y.o. F) Primary Care Provider: Hoy [name] Other Clinician: Referring Provider: Treating Provider/Extender: [name], [name] in Treatment: 73 Constitutional Well-nourished and well-hydrated in no acute distress. Respiratory normal breathing without difficulty. Psychiatric this patient is able to make decisions and demonstrates good insight into disease process. Alert and Oriented x 3. pleasant and cooperative. Notes Based on what I am seeing I [name] believe that the patient'[name] wound is doing well no debridement of the callus needed today around the edges of the wound the surface of the wound was sprayed on plantar foot and  the leg is looking better as well I am pleased in both regards. Electronic Signature([name]) Signed: 08/30/2023 1:56:57 PM By: Karla Derry PA-C Entered By: [name] on 08/30/2023 13:56:57 -------------------------------------------------------------------------------- Physician Orders Details Patient Name: Date of Service: [name] [name]. 08/30/2023 1:15 PM Medical Record Number: [id number] Patient Account Number: [id number] Date of Birth/Sex: Treating RN: 11-30-1960 (61 y.o. [name] Primary Care Provider: Hoy [name] Other Clinician: [name], Karla ([id number]) [id number] Page 5 of 12 Referring Provider: Treating Provider/Extender: [name], [name] in Treatment: 63 Verbal / Phone Orders: No Diagnosis Coding ICD-10 Coding Code Description  [name], [name] ([id number]) [id number] Page 1 of 12 Visit Report for 08/30/2023 Chief Complaint Document Details Patient Name: Date of Service: [name] 08/30/2023 1:15 PM Medical Record Number: [id number] Patient Account Number: [id number] Date of Birth/Sex: Treating RN: 01-12-1961 (62 y.o. F) Primary Care Provider: Hoy [name] Other Clinician: Referring Provider: Treating Provider/Extender: [name], [name] in Treatment: 19 Information Obtained from: Patient Chief Complaint Bilateral LE Ulcers Electronic Signature([name]) Signed: 08/30/2023 1:55:47 PM By: Karla Derry PA-C Entered By: [name] on 08/30/2023 13:55:47 -------------------------------------------------------------------------------- HPI Details Patient Name: Date of Service: [name] [name]. 08/30/2023 1:15 PM Medical Record Number: [id number] Patient Account Number: [id number] Date of Birth/Sex: Treating RN: 1961-03-04 (61 y.o. F) Primary Care Provider: Hoy [name] Other Clinician: Referring Provider: Treating Provider/Extender: [name], [name] in Treatment: 19 History of Present Illness HPI Description: This 62 year old patient who has a very long significant history of diabetes mellitus, previous alcohol and nicotine abuse, chronic data disease, COPD, diabetes mellitus, height hypertension, critical lower limb ischemia with several wounds being managed at the wound Center at [facility] for over a year. She was recently in the ER at [facility] and was referred to our center. He has had a long history of critical limb ischemia and over a period of time has had balloon angioplasties in March 2016, endarterectomy of the left carotid, lower extremity angiogram and treatment by Dr. Nanetta [name], several cardiac catheterizations. Most recently she had an x-ray of her left foot while in the ER which showed no acute  abnormality. during this ER visit she was started on ciprofloxacin and asked to continue with the wound care physicians at [name]. Her last ABI done in June 2017 showed the right side to be 0.28 in the left side to be 0.48. Her right toe brachial index was 0.17 on the right and 0.27 on the left. her last hemoglobin A1c was 11.1% She continues to smoke about a pack of cigarettes a day. 03/28/2017 -- -- right foot x-ray -- IMPRESSION:Areas of soft tissue swelling. Mild subluxation second PIP joint. No frank dislocation or fracture. No erosive change or bony destruction. No soft tissue ulceration or radiopaque foreign body evident by radiography. There is plantar fascia calcification with a nearby inferior calcaneal spur. There are foci of arterial vascular calcification. 04/04/2017 -- the patient continues to be noncompliant and continues to complain of a lot of pain and has not done anything about quitting smoking Readmission: 06/26/18 on evaluation today patient presents for reevaluation she has not been seen in our office since May 2018. Since she was last seen here in our office she has undergone testing at Dr. Durenda Age office where it was revealed that she had an ABI of 0.68 with her previous ABI being 0.64 and a left ABI of 0.38 with previous ABI being 0.34 this study which was performed on 04/05/18 was pretty much equivalent to the study performed on 11/22/17. The findings in the end revealed on the right would appear to be moderate right lower extremity arterial disease on the left Dr. Allyson Sabal states moderate in the report but unfortunately this appears to be much more severe at 0.38 compared to the right. Subsequently the patient was scheduled to have angiography with Dr. Allyson Sabal on 04/12/18. This was however canceled due to the fact that the patient was found to have chronic kidney disease stage III and it was to the point that he did not  foot this is gena be a little bit more problematic with the poor blood flow. Nonetheless I discussed continuing with the Iodoflex which I think is doing a good job here. 3. Muscle can recommend that the patient should continue to monitor for any signs of infection or worsening if anything changes she has contact the office let me know. We will see patient back for reevaluation in 1 week here in the clinic. If anything worsens or changes patient will contact  our office for additional recommendations. Electronic Signature([name]) Signed: 08/30/2023 1:57:32 PM By: Karla Derry PA-C Entered By: [name] on 08/30/2023 13:57:32 [name] ([id number]) [id number] Page 12 of 12 -------------------------------------------------------------------------------- SuperBill Details Patient Name: Date of Service: [name] 08/30/2023 Medical Record Number: [id number] Patient Account Number: [id number] Date of Birth/Sex: Treating RN: 1960/12/14 (62 y.o. 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 L97.522 Non-pressure chronic ulcer of other part of left foot with fat layer exposed E11.622 Type 2 diabetes mellitus with other skin ulcer L97.812 Non-pressure chronic ulcer of other part of right lower leg with fat layer exposed I73.89 Other specified peripheral vascular diseases I87.331 Chronic venous hypertension (idiopathic) with ulcer and inflammation of right lower extremity Facility Procedures : CPT4 Code: 56387564 Description: 99213 - WOUND CARE VISIT-LEV 3 EST PT Modifier: Quantity: 1 Physician Procedures : CPT4 Code Description Modifier [id number] 99213 - WC PHYS LEVEL 3 - EST PT ICD-10 Diagnosis Description 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 This is actually showing signs of looking about the same I did clear a lot of callus up last week this actually looks better to me from an overall visual standpoint than last week though it is a little larger because of the callus I had removed. With that being said I feel like she is actually making really good progress here towards complete closure. No fevers, chills, nausea, vomiting, or diarrhea. 08-09-2023 upon evaluation today patient presents for follow-up concerning her ongoing issues with her right plantar foot ulcer. She is going to be having arteriogram on Friday that is just just 2 days and hopefully following this it will actually allow for more aggressive healing in regard to the right plantar foot. Today I am going to perform some debridement to clear away some of the necrotic debris. 08-23-2023 upon evaluation patient'[name] wound really appears to be doing about the same. She did have her arteriogram with Dr. Lenell [name] and that was on 9-27- 2024. Unfortunately it was noted that the patient had no good options for further revascularization. They plan to reevaluate  there in a month but she is stated to be at "high risk for major amputation". With that being said it does appear they were able to [name] stenting of the right femoral-popliteal artery but unfortunately they were unable to cross the TP trunk and proximal peroneal occlusion. Subsequently this means that there is really not good blood flow into the ankle and foot which means that the patient really does not have a good chance of getting this to heal. 08-30-2023 upon evaluation today patient appears to be doing well currently in regard to her wound. She has been tolerating the dressing changes without complication and in general I [name] feel that the patient is doing quite well at this time. Unfortunately with the blood flow being so low and really no real vascular options to improve blood flow she really has to try to stay off of this is much as possible. Electronic Signature([name]) Signed: 08/30/2023 1:56:40 PM By: Karla Derry PA-C Entered By: [name] on 08/30/2023 13:56:40 -------------------------------------------------------------------------------- Physical Exam Details Patient Name: Date of Service: [name] [name]. 08/30/2023 1:15 PM Medical Record Number: [id number] Patient Account Number: [id number] Date of Birth/Sex: Treating RN: 04/02/1961 (61 y.o. F) Primary Care Provider: Hoy [name] Other Clinician: Referring Provider: Treating Provider/Extender: [name], [name] in Treatment: 73 Constitutional Well-nourished and well-hydrated in no acute distress. Respiratory normal breathing without difficulty. Psychiatric this patient is able to make decisions and demonstrates good insight into disease process. Alert and Oriented x 3. pleasant and cooperative. Notes Based on what I am seeing I [name] believe that the patient'[name] wound is doing well no debridement of the callus needed today around the edges of the wound the surface of the wound was sprayed on plantar foot and  the leg is looking better as well I am pleased in both regards. Electronic Signature([name]) Signed: 08/30/2023 1:56:57 PM By: Karla Derry PA-C Entered By: [name] on 08/30/2023 13:56:57 -------------------------------------------------------------------------------- Physician Orders Details Patient Name: Date of Service: [name] [name]. 08/30/2023 1:15 PM Medical Record Number: [id number] Patient Account Number: [id number] Date of Birth/Sex: Treating RN: 11-30-1960 (61 y.o. [name] Primary Care Provider: Hoy [name] Other Clinician: [name], Karla ([id number]) [id number] Page 5 of 12 Referring Provider: Treating Provider/Extender: [name], [name] in Treatment: 63 Verbal / Phone Orders: No Diagnosis Coding ICD-10 Coding Code Description  in regard to her wound. This is actually showing signs of looking about the same I did clear a lot of callus up last week this actually looks better to me from an overall visual standpoint than last week though it is a little larger because of the callus I had removed. With that being said I feel like she is actually making really good progress here towards complete closure. No fevers, chills, nausea, vomiting, or diarrhea. 08-09-2023 upon evaluation today patient presents for follow-up concerning her ongoing issues with her right plantar foot ulcer. She is going to be having arteriogram on Friday that is just just 2 days and hopefully following this it will actually allow for more aggressive healing in regard to the right plantar foot. Today I am going to perform some debridement to clear away some of the necrotic debris. 08-23-2023 upon evaluation patient'[name] wound really appears to be doing about the same. She did have her arteriogram with Dr. Lenell [name] and that was on 9-27- 2024. Unfortunately it was noted that the patient had no good options for further revascularization. They plan to reevaluate  there in a month but she is stated to be at "high risk for major amputation". With that being said it does appear they were able to [name] stenting of the right femoral-popliteal artery but unfortunately they were unable to cross the TP trunk and proximal peroneal occlusion. Subsequently this means that there is really not good blood flow into the ankle and foot which means that the patient really does not have a good chance of getting this to heal. 08-30-2023 upon evaluation today patient appears to be doing well currently in regard to her wound. She has been tolerating the dressing changes without complication and in general I [name] feel that the patient is doing quite well at this time. Unfortunately with the blood flow being so low and really no real vascular options to improve blood flow she really has to try to stay off of this is much as possible. Electronic Signature([name]) Signed: 08/30/2023 1:56:40 PM By: Karla Derry PA-C Entered By: [name] on 08/30/2023 13:56:40 -------------------------------------------------------------------------------- Physical Exam Details Patient Name: Date of Service: [name] [name]. 08/30/2023 1:15 PM Medical Record Number: [id number] Patient Account Number: [id number] Date of Birth/Sex: Treating RN: 04/02/1961 (61 y.o. F) Primary Care Provider: Hoy [name] Other Clinician: Referring Provider: Treating Provider/Extender: [name], [name] in Treatment: 73 Constitutional Well-nourished and well-hydrated in no acute distress. Respiratory normal breathing without difficulty. Psychiatric this patient is able to make decisions and demonstrates good insight into disease process. Alert and Oriented x 3. pleasant and cooperative. Notes Based on what I am seeing I [name] believe that the patient'[name] wound is doing well no debridement of the callus needed today around the edges of the wound the surface of the wound was sprayed on plantar foot and  the leg is looking better as well I am pleased in both regards. Electronic Signature([name]) Signed: 08/30/2023 1:56:57 PM By: Karla Derry PA-C Entered By: [name] on 08/30/2023 13:56:57 -------------------------------------------------------------------------------- Physician Orders Details Patient Name: Date of Service: [name] [name]. 08/30/2023 1:15 PM Medical Record Number: [id number] Patient Account Number: [id number] Date of Birth/Sex: Treating RN: 11-30-1960 (61 y.o. [name] Primary Care Provider: Hoy [name] Other Clinician: [name], Karla ([id number]) [id number] Page 5 of 12 Referring Provider: Treating Provider/Extender: [name], [name] in Treatment: 63 Verbal / Phone Orders: No Diagnosis Coding ICD-10 Coding Code Description  x Per Week/30 Days Discharge Instructions: Apply moisturizing lotion as directed Prim Dressing: IODOFLEX 0.9% Cadexomer Iodine Pad 4x6 cm 3 x Per Week/30 Days ary Discharge Instructions: *****OR IODOSORB****Apply to wound bed as instructed Secondary Dressing: ABD Pad, 8x10 3 x Per Week/30 Days Discharge Instructions: Apply over primary dressing as directed. Secured With: [employer], 4.5x3.1 (in/yd) 3 x Per Week/30 Days Discharge Instructions: Secure with Kerlix as directed. Secured With: 39M Medipore H Soft Cloth Surgical T ape, 4 x 10 (in/yd) 3 x Per Week/30 Days Discharge Instructions: Secure with tape as directed. Compression Wrap: tubigrip size E single layer 3 x Per Week/30 Days Discharge Instructions: apply in the morning and remove at night. Patient Medications llergies: gabapentin, Lyrica A Notifications Medication Indication Start End 08/30/2023 lidocaine DOSE topical 5 % ointment - ointment topical once daily Electronic Signature([name]) Signed: 08/30/2023 4:26:00 PM By: Karla Derry PA-C Signed: 09/11/2023 4:05:37 PM By: Redmond Pulling RN, BSN Entered By: Redmond Pulling on  08/30/2023 13:54:20 -------------------------------------------------------------------------------- Problem List Details Patient Name: Date of Service: [name] [name]. 08/30/2023 1:15 PM Medical Record Number: [id number] Patient Account Number: [id number] Date of Birth/Sex: Treating RN: 1961/08/08 (61 y.o. F) Primary Care Provider: Hoy [name] Other Clinician: Referring Provider: Treating Provider/Extender: [name], [name] in Treatment: 19 Active Problems ICD-10 Encounter Code Description Active Date MDM Diagnosis 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 2 diabetes mellitus with foot ulcer 04/19/2023 No Yes L97.512 Non-pressure chronic ulcer of other part of right foot with fat layer exposed 04/19/2023 No Yes L97.522 Non-pressure chronic ulcer of other part of left foot with fat layer exposed 04/19/2023 No Yes E11.622 Type 2 diabetes mellitus with other skin ulcer 04/19/2023 No Yes [name], [name] ([id number]) [id number] Page 7 of 12 [id number] Non-pressure chronic ulcer of other part of right lower leg with fat layer 04/19/2023 No Yes exposed I73.89 Other specified peripheral vascular diseases 04/19/2023 No Yes I87.331 Chronic venous hypertension (idiopathic) with ulcer and inflammation of right 04/19/2023 No Yes lower extremity Inactive Problems Resolved Problems Electronic Signature([name]) Signed: 08/30/2023 1:48:37 PM By: Karla Derry PA-C Entered By: [name] on 08/30/2023 13:48:37 -------------------------------------------------------------------------------- Progress Note Details Patient Name: Date of Service: [name] [name]. 08/30/2023 1:15 PM Medical Record Number: [id number] Patient Account Number: [id number] Date of Birth/Sex: Treating RN: 10-09-1961 (61 y.o. F) Primary Care Provider: Hoy [name] Other Clinician: Referring Provider: Treating Provider/Extender: [name], [name] in Treatment: 37 Subjective Chief  Complaint Information obtained from Patient Bilateral LE Ulcers History of Present Illness (HPI) This 62 year old patient who has a very long significant history of diabetes mellitus, previous alcohol and nicotine abuse, chronic data disease, COPD, diabetes mellitus, height hypertension, critical lower limb ischemia with several wounds being managed at the wound Center at [facility] for over a year. She was recently in the ER at [facility] and was referred to our center. He has had a long history of critical limb ischemia and over a period of time has had balloon angioplasties in March 2016, endarterectomy of the left carotid, lower extremity angiogram and treatment by Dr. Nanetta [name], several cardiac catheterizations. Most recently she had an x-ray of her left foot while in the ER which showed no acute abnormality. during this ER visit she was started on ciprofloxacin and asked to continue with the wound care physicians at [facility]. Her last ABI done in June 2017 showed the right side to be 0.28 in the left side to be 0.48. Her right toe  in regard to her wound. This is actually showing signs of looking about the same I did clear a lot of callus up last week this actually looks better to me from an overall visual standpoint than last week though it is a little larger because of the callus I had removed. With that being said I feel like she is actually making really good progress here towards complete closure. No fevers, chills, nausea, vomiting, or diarrhea. 08-09-2023 upon evaluation today patient presents for follow-up concerning her ongoing issues with her right plantar foot ulcer. She is going to be having arteriogram on Friday that is just just 2 days and hopefully following this it will actually allow for more aggressive healing in regard to the right plantar foot. Today I am going to perform some debridement to clear away some of the necrotic debris. 08-23-2023 upon evaluation patient'[name] wound really appears to be doing about the same. She did have her arteriogram with Dr. Lenell [name] and that was on 9-27- 2024. Unfortunately it was noted that the patient had no good options for further revascularization. They plan to reevaluate  there in a month but she is stated to be at "high risk for major amputation". With that being said it does appear they were able to [name] stenting of the right femoral-popliteal artery but unfortunately they were unable to cross the TP trunk and proximal peroneal occlusion. Subsequently this means that there is really not good blood flow into the ankle and foot which means that the patient really does not have a good chance of getting this to heal. 08-30-2023 upon evaluation today patient appears to be doing well currently in regard to her wound. She has been tolerating the dressing changes without complication and in general I [name] feel that the patient is doing quite well at this time. Unfortunately with the blood flow being so low and really no real vascular options to improve blood flow she really has to try to stay off of this is much as possible. Electronic Signature([name]) Signed: 08/30/2023 1:56:40 PM By: Karla Derry PA-C Entered By: [name] on 08/30/2023 13:56:40 -------------------------------------------------------------------------------- Physical Exam Details Patient Name: Date of Service: [name] [name]. 08/30/2023 1:15 PM Medical Record Number: [id number] Patient Account Number: [id number] Date of Birth/Sex: Treating RN: 04/02/1961 (61 y.o. F) Primary Care Provider: Hoy [name] Other Clinician: Referring Provider: Treating Provider/Extender: [name], [name] in Treatment: 73 Constitutional Well-nourished and well-hydrated in no acute distress. Respiratory normal breathing without difficulty. Psychiatric this patient is able to make decisions and demonstrates good insight into disease process. Alert and Oriented x 3. pleasant and cooperative. Notes Based on what I am seeing I [name] believe that the patient'[name] wound is doing well no debridement of the callus needed today around the edges of the wound the surface of the wound was sprayed on plantar foot and  the leg is looking better as well I am pleased in both regards. Electronic Signature([name]) Signed: 08/30/2023 1:56:57 PM By: Karla Derry PA-C Entered By: [name] on 08/30/2023 13:56:57 -------------------------------------------------------------------------------- Physician Orders Details Patient Name: Date of Service: [name] [name]. 08/30/2023 1:15 PM Medical Record Number: [id number] Patient Account Number: [id number] Date of Birth/Sex: Treating RN: 11-30-1960 (61 y.o. [name] Primary Care Provider: Hoy [name] Other Clinician: [name], Karla ([id number]) [id number] Page 5 of 12 Referring Provider: Treating Provider/Extender: [name], [name] in Treatment: 63 Verbal / Phone Orders: No Diagnosis Coding ICD-10 Coding Code Description  in regard to her wound. This is actually showing signs of looking about the same I did clear a lot of callus up last week this actually looks better to me from an overall visual standpoint than last week though it is a little larger because of the callus I had removed. With that being said I feel like she is actually making really good progress here towards complete closure. No fevers, chills, nausea, vomiting, or diarrhea. 08-09-2023 upon evaluation today patient presents for follow-up concerning her ongoing issues with her right plantar foot ulcer. She is going to be having arteriogram on Friday that is just just 2 days and hopefully following this it will actually allow for more aggressive healing in regard to the right plantar foot. Today I am going to perform some debridement to clear away some of the necrotic debris. 08-23-2023 upon evaluation patient'[name] wound really appears to be doing about the same. She did have her arteriogram with Dr. Lenell [name] and that was on 9-27- 2024. Unfortunately it was noted that the patient had no good options for further revascularization. They plan to reevaluate  there in a month but she is stated to be at "high risk for major amputation". With that being said it does appear they were able to [name] stenting of the right femoral-popliteal artery but unfortunately they were unable to cross the TP trunk and proximal peroneal occlusion. Subsequently this means that there is really not good blood flow into the ankle and foot which means that the patient really does not have a good chance of getting this to heal. 08-30-2023 upon evaluation today patient appears to be doing well currently in regard to her wound. She has been tolerating the dressing changes without complication and in general I [name] feel that the patient is doing quite well at this time. Unfortunately with the blood flow being so low and really no real vascular options to improve blood flow she really has to try to stay off of this is much as possible. Electronic Signature([name]) Signed: 08/30/2023 1:56:40 PM By: Karla Derry PA-C Entered By: [name] on 08/30/2023 13:56:40 -------------------------------------------------------------------------------- Physical Exam Details Patient Name: Date of Service: [name] [name]. 08/30/2023 1:15 PM Medical Record Number: [id number] Patient Account Number: [id number] Date of Birth/Sex: Treating RN: 04/02/1961 (61 y.o. F) Primary Care Provider: Hoy [name] Other Clinician: Referring Provider: Treating Provider/Extender: [name], [name] in Treatment: 73 Constitutional Well-nourished and well-hydrated in no acute distress. Respiratory normal breathing without difficulty. Psychiatric this patient is able to make decisions and demonstrates good insight into disease process. Alert and Oriented x 3. pleasant and cooperative. Notes Based on what I am seeing I [name] believe that the patient'[name] wound is doing well no debridement of the callus needed today around the edges of the wound the surface of the wound was sprayed on plantar foot and  the leg is looking better as well I am pleased in both regards. Electronic Signature([name]) Signed: 08/30/2023 1:56:57 PM By: Karla Derry PA-C Entered By: [name] on 08/30/2023 13:56:57 -------------------------------------------------------------------------------- Physician Orders Details Patient Name: Date of Service: [name] [name]. 08/30/2023 1:15 PM Medical Record Number: [id number] Patient Account Number: [id number] Date of Birth/Sex: Treating RN: 11-30-1960 (61 y.o. [name] Primary Care Provider: Hoy [name] Other Clinician: [name], Karla ([id number]) [id number] Page 5 of 12 Referring Provider: Treating Provider/Extender: [name], [name] in Treatment: 63 Verbal / Phone Orders: No Diagnosis Coding ICD-10 Coding Code Description

## 2023-08-30 NOTE — Progress Notes (Signed)
Paramedicine Encounter    Patient ID: [name], female    DOB: February 22, 1961, 62 y.o.   MRN: [id number]   Complaints-bottom pain-ulcer   and foot pain   Edema-yes  Compliance with meds-yes  Pill box filled-yes If so, by whom-paramedic  Refills needed- Levothyroxine--[facility] [address] [facility]  Metoprolol rosuvastatin   Pt reports she is doing ok despite an ulcer on her bottom and her feet are doing ok. Still being watched carefully at the wound doc.   She reports breathing is doing ok. She reports not sleeping good due to foot pain. She does have hx of gout but her cramping pains feel different from her gout flares.  She goes to wound doc today and they are suppose to wrap her legs. She also is suppose to get [facility] come tomor.  Meds verified and pill box refilled.  No bleeding issues. No c/p, no dizziness.   CBG PTA -149  BP (!) 150/80   Pulse 75   Resp 18   LMP  (LMP Unknown)   SpO2 98%  Weight yesterday-? Last visit weight-215   Patient Care Team: [name], MD as PCP - General (Family Medicine) Runell Gess, MD as PCP - Cardiology (Cardiology) Lanier Prude, MD as PCP - Electrophysiology (Cardiology) Runell Gess, MD as Consulting Physician (Cardiology) Nyoka Cowden, MD as Consulting Physician (Pulmonary Disease) System, Provider Not In  Patient Active Problem List   Diagnosis Date Noted   Severe sepsis with acute organ dysfunction ([name]) 07/27/2023   Urinary tract infection without hematuria 07/27/2023   Encephalopathy acute 07/27/2023   Acute renal failure superimposed on stage 3 chronic kidney disease ([name]) 07/27/2023   Severe sepsis ([name]) 07/27/2023   Pre-ulcerative calluses 04/25/2022   Viral gastroenteritis 04/18/2022   Hyperlipidemia associated with type 2 diabetes mellitus ([name]) 07/29/2021   Uncontrolled type 2 diabetes mellitus with hyperglycemia ([name]) 06/13/2021   Acute  kidney injury superimposed on CKD ([name]) 06/13/2021   Critical ischemia of foot ([name]) 06/07/2021   Fall 05/05/2021   PAF (paroxysmal atrial fibrillation) ([name]) 05/05/2021   COPD (chronic obstructive pulmonary disease) ([name]) 05/05/2021   DM (diabetes mellitus), type 2 with complications ([name]) 05/05/2021   PAD (peripheral artery disease) ([name]) 05/05/2021   Cellulitis 05/05/2021   Acute on chronic combined systolic and diastolic CHF (congestive heart failure) ([name]) 05/05/2021   OSA (obstructive sleep apnea) 05/05/2021   Stage 3b chronic kidney disease ([name]) 05/05/2021   AKI (acute kidney injury) ([name]) 05/04/2021   Pressure injury of skin 05/04/2021   Ulcer of foot, unspecified laterality, limited to breakdown of skin ([name]) 02/17/2021   AF (paroxysmal atrial fibrillation) ([name]) 01/02/2021   CAD (coronary artery disease) 01/02/2021   PVD (peripheral vascular disease) ([name]) 01/02/2021   Diabetes mellitus type 2, uncontrolled, with complications 01/02/2021   Diabetic nephropathy ([name]) 01/02/2021   Low back pain 06/01/2020   Hyperlipidemia 04/03/2020   Muscle weakness 03/20/2020   Pain in elbow 03/12/2020   PAD (peripheral artery disease) ([name]) 12/26/2019   Acute renal failure ([name])    Acute respiratory failure with hypoxia ([name]) 12/02/2019   Cellulitis in diabetic foot ([name]) 07/08/2019   Osteomyelitis ([name]) 06/21/2019   NICM (nonischemic cardiomyopathy) ([name]) 06/20/2019   Non-healing ulcer ([name]) 06/20/2019   CHF (congestive heart failure) ([name]) 11/26/2018   Coagulation disorder ([name]) 08/09/2017   Depression 07/21/2017   At risk for adverse drug reaction 06/20/2017   Peripheral neuropathy 06/20/2017  S/P transmetatarsal amputation of foot, right ([name]) 06/05/2017   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) 05/19/2017   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[name])    SIRS (systemic inflammatory response syndrome) ([name]) 04/06/2017    Suspected sleep apnea 11/24/2016   Ulcer of toe of right foot, with necrosis of bone ([name]) 10/27/2016   Ulcer of left lower leg ([name]) 05/19/2016   Severe obesity (BMI >= 40) ([name]) 02/24/2016   Morbid obesity ([name]) 02/24/2016   Encounter for therapeutic drug monitoring 02/10/2016   Symptomatic bradycardia 01/12/2016   Hypertension associated with diabetes ([name]) 12/22/2015   Chronic combined systolic and diastolic CHF (congestive heart failure) ([name])    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 11/08/2015   Hypokalemia 11/08/2015   Tobacco abuse 10/23/2015   Coronary artery disease    DOE (dyspnea on exertion) 04/29/2015   Diabetes mellitus type 2, uncontrolled 02/08/2015   Influenza A 02/07/2015   Wrist fracture, left, closed, initial encounter 01/29/2015   PAF (paroxysmal atrial fibrillation) ([name]) 01/16/2015   Carotid arterial disease ([name]) 01/16/2015   Claudication ([name]) 01/15/2015   Demand ischemia ([name]) 10/29/2014   Insomnia 02/03/2014   S/P peripheral artery angioplasty - TurboHawk atherectomy; R SFA 09/11/2013    Class: Acute   Leg pain, bilateral 08/19/2013   Hypothyroidism 07/31/2013   Cellulitis 06/13/2013   History of cocaine abuse ([name]) 06/13/2013   Long term current use of anticoagulant therapy 05/20/2013   Alcohol abuse    Narcotic abuse ([name])    Marijuana abuse    Alcoholic cirrhosis ([name])    DM (diabetes mellitus), type 2 with peripheral vascular complications ([name])     Current Outpatient Medications:    acetaminophen (TYLENOL) 500 MG tablet, Take 1,000 mg by mouth every 6 (six) hours as needed for headache (pain)., Disp: , Rfl:    albuterol (VENTOLIN HFA) 108 (90 Base) MCG/ACT inhaler, USE 2 PUFFS BY MOUTH EVERY FOUR HOURS, AS NEEDED, FOR COUGHING/WHEEZING, Disp: 8.5 g, Rfl: 0   apixaban (ELIQUIS) 5 MG TABS tablet, Take 1 tablet (5 mg total) by mouth 2 (two) times daily., Disp: 60 tablet, Rfl: 6   budesonide-formoterol (SYMBICORT) 160-4.5 MCG/ACT inhaler, Inhale 2 puffs into the  lungs 2 (two) times daily., Disp: 1 each, Rfl: 2   buPROPion ER (WELLBUTRIN SR) 100 MG 12 hr tablet, Take 1 tablet (100 mg total) by mouth daily., Disp: 30 tablet, Rfl: 6   Cholecalciferol (VITAMIN D3 PO), Take 1 capsule by mouth every morning., Disp: , Rfl:    doxycycline (VIBRAMYCIN) 100 MG capsule, Take 100 mg by mouth 2 (two) times daily., Disp: , Rfl:    icosapent Ethyl (VASCEPA) 1 g capsule, Take 2 capsules (2 g total) by mouth 2 (two) times daily., Disp: 120 capsule, Rfl: 6   insulin lispro (HUMALOG) 100 UNIT/ML KwikPen, Inject 10 Units into the skin 3 (three) times daily with meals., Disp: 15 mL, Rfl: 0   LANTUS SOLOSTAR 100 UNIT/ML Solostar Pen, Inject 37 Units into the skin daily. (Patient taking differently: Inject 40 Units into the skin daily.), Disp: 15 mL, Rfl: 0   levothyroxine (SYNTHROID) 50 MCG tablet, TAKE 1 TABLET (50 MCG TOTAL) BY MOUTH DAILY BEFORE BREAKFAST (AM), Disp: 30 tablet, Rfl: 0   losartan (COZAAR) 25 MG tablet, TAKE 2 TABLETS (50 MG TOTAL) BY MOUTH DAILY (AM), Disp: 60 tablet, Rfl: 3   metoprolol tartrate (LOPRESSOR) 25 MG tablet, Take 0.5 tablets (12.5 mg total) by mouth 2 (two) times daily., Disp:  60 tablet, Rfl: 1   pantoprazole (PROTONIX) 40 MG tablet, TAKE 1 TABLET (40 MG TOTAL) BY MOUTH DAILY(AM), Disp: 30 tablet, Rfl: 0   rosuvastatin (CRESTOR) 40 MG tablet, Take 1 tablet (40 mg total) by mouth daily. (Patient taking differently: Take 40 mg by mouth at bedtime.), Disp: 90 tablet, Rfl: 3   torsemide (DEMADEX) 100 MG tablet, TAKE 1 TABLET (100 MG TOTAL) BY MOUTH 2 (TWO) TIMES DAILY. (AM+BEDTIME), Disp: 60 tablet, Rfl: 3   VITAMIN E PO, Take 1 tablet by mouth daily., Disp: , Rfl:    ACCU-CHEK GUIDE test strip, USE TO CHECK BLOOD SUGAR FOUR TIMES DAILY, Disp: , Rfl:    colchicine 0.6 MG tablet, Take 1.2 mg by mouth See admin instructions. Take 1.2 mg for flair up and an hour later take 0.6 mg if needed, Disp: , Rfl:    EASY COMFORT PEN NEEDLES 31G X 5 MM MISC, USE  FOUR TIMES A DAY FOR INSULIN ADMINISTRATION, Disp: 100 each, Rfl: 1   metolazone (ZAROXOLYN) 2.5 MG tablet, TO BE USED AS DIRECTED BY HEART FAILURE CLINIC (Patient not taking: Reported on 08/23/2023), Disp: 10 tablet, Rfl: 0   nystatin cream (MYCOSTATIN), Apply topically 2 (two) times daily., Disp: 30 g, Rfl: 0   Omega-3 Fatty Acids (FISH OIL PO), Take 1 capsule by mouth every morning., Disp: , Rfl:    polyethylene glycol (MIRALAX / GLYCOLAX) 17 g packet, Take 17 g by mouth daily as needed for moderate constipation., Disp: 14 each, Rfl: 0   triamcinolone ointment (KENALOG) 0.1 %, Apply topically 2 (two) times daily., Disp: 454 g, Rfl: 0 Allergies  Allergen Reactions   Gabapentin Nausea And Vomiting and Other (See Comments)    POSSIBLE SHAKING   Lyrica [Pregabalin] Other (See Comments)    Shaking       Social History   Socioeconomic History   Marital status: Single    Spouse name: Not on file   Number of children: 1   Years of education: 12   Highest education level: 12th grade  Occupational History   Occupation: disabled  Tobacco Use   Smoking status: Former    Current packs/day: 1.00    Average packs/day: 1 pack/day for 44.0 years (44.0 ttl pk-yrs)    Types: E-cigarettes, Cigarettes   Smokeless tobacco: Former    Types: Snuff  Vaping Use   Vaping status: Former   Devices: 11/26/2018 "stopped months ago"  Substance and Sexual Activity   Alcohol use: Not Currently    Comment: occ   Drug use: Not Currently    Types: "Crack" cocaine, Marijuana, Oxycodone   Sexual activity: Not Currently  Other Topics Concern   Not on file  Social History Narrative   ** Merged History Encounter **       Lives in [place], in motel with sister.  They are looking to move but don't have a place to go yet.     Social Determinants of Health   Financial Resource Strain: Low Risk  (02/09/2023)   Overall Financial Resource Strain (CARDIA)    Difficulty of Paying Living Expenses: Not hard at all   Food Insecurity: No Food Insecurity (02/09/2023)   Hunger Vital Sign    Worried About Running Out of Food in the Last Year: Never true    Ran Out of Food in the Last Year: Never true  Transportation Needs: No Transportation Needs (02/09/2023)   PRAPARE - Administrator, Civil Service (Medical): No  Lack of Transportation (Non-Medical): No  Physical Activity: Inactive (02/09/2023)   Exercise Vital Sign    Days of Exercise per Week: 0 days    Minutes of Exercise per Session: 0 min  Stress: No Stress Concern Present (02/09/2023)   [employer] of Occupational Health - Occupational Stress Questionnaire    Feeling of Stress : Not at all  Social Connections: Unknown (08/21/2023)   [facility]   Social Network    Social Network: Not on file  Intimate Partner Violence: Unknown (08/21/2023)   Received from [facility]   HITS    Physically Hurt: Not on file    Insult or Talk Down To: Not on file    Threaten Physical Harm: Not on file    Scream or Curse: Not on file    Physical Exam      Future Appointments  Date Time Provider Department Center  09/06/2023  2:00 PM Allen Derry Cambalache III, Cordelia Poche [facility]  09/07/2023  9:50 AM [name], MD [facility] None  09/12/2023  7:30 AM [facility] 1 [facility]  09/12/2023  8:00 AM [facility] VASC 1 [facility]  09/12/2023  8:40 AM Leonie Douglas, MD [facility]  09/13/2023 12:30 PM Allen Derry [place] III, PA-C [facility]  09/20/2023 12:30 PM Allen Derry Buhl III, PA-C [facility]  09/27/2023 12:30 PM Allen Derry [place] III, PA-C [facility]  10/03/2023  2:20 PM Laurey Morale, MD [facility] None  02/12/2024  4:00 PM [facility] VISIT [facility] None       Kerry Hough, Paramedic [phone] [facility] Paramedic  08/30/23

## 2023-08-31 ENCOUNTER — Ambulatory Visit: Payer: Self-pay

## 2023-08-31 ENCOUNTER — Telehealth (HOSPITAL_COMMUNITY): Payer: Self-pay

## 2023-08-31 ENCOUNTER — Other Ambulatory Visit: Payer: Self-pay | Admitting: Physician Assistant

## 2023-08-31 ENCOUNTER — Telehealth: Payer: Self-pay | Admitting: Family Medicine

## 2023-08-31 ENCOUNTER — Other Ambulatory Visit: Payer: Self-pay | Admitting: *Deleted

## 2023-08-31 DIAGNOSIS — I70235 Atherosclerosis of native arteries of right leg with ulceration of other part of foot: Secondary | ICD-10-CM

## 2023-08-31 NOTE — Telephone Encounter (Signed)
Spoke with Tobi Bastos , Pharmacy at [facility] advised that when used for a larger body surface area this will provide a 30-day supply.

## 2023-08-31 NOTE — Telephone Encounter (Signed)
error 

## 2023-08-31 NOTE — Telephone Encounter (Signed)
  Chief Complaint: Question from pharmacy regarding rx Symptoms:  Frequency:  Pertinent Negatives: Patient denies  Disposition: [] ED /[] Urgent Care (no appt availability in office) / [] Appointment(In office/virtual)/ []  [facility]/ [] Home Care/ [] Refused Recommended Disposition /[] [facility]/ []  Follow-up with PCP Additional Notes: [facility] pharmacy is calling in requesting information on triamcinolone ointment (KENALOG) 0.1 % [[id number]] . They need to know how many days the prescribed 454 grams will last the pt. [phone]    [facility] pharmacy is calling in requesting information on triamcinolone ointment (KENALOG) 0.1 % [[id number]] . They need to know how many days the prescribed 454 grams will last the pt. [phone]  Reason for Disposition  [1] Caller has URGENT medicine question about med that PCP or specialist prescribed AND [2] triager unable to answer question  Answer Assessment - Initial Assessment Questions 1. NAME of MEDICINE: "What medicine(s) are you calling about?"     Kenalog 2. QUESTION: "What is your question?" (e.g., double dose of medicine, side effect)     [facility] pharmacy is calling in requesting information on triamcinolone ointment (KENALOG) 0.1 % [[id number]] . They need to know how many days the prescribed 454 grams will last the pt. [phone]  Protocols used: Medication Question Call-A-AH

## 2023-08-31 NOTE — Telephone Encounter (Deleted)
[facility] pharmacy is calling in requesting information on triamcinolone ointment (KENALOG) 0.1 % [[id number]] . They need to know how many days the prescribed 454 grams will last the pt. [phone]

## 2023-08-31 NOTE — Telephone Encounter (Signed)
Contacted [facility] to request the following for refills:  Levothyroxine--[facility] [address] [facility]  Metoprolol rosuvastatin     Kerry Hough, Paramedic [phone] 08/31/23

## 2023-08-31 NOTE — Progress Notes (Signed)
US orders placed.

## 2023-09-05 ENCOUNTER — Other Ambulatory Visit (HOSPITAL_COMMUNITY): Payer: Self-pay

## 2023-09-05 ENCOUNTER — Telehealth: Payer: Self-pay | Admitting: Family Medicine

## 2023-09-05 NOTE — Telephone Encounter (Signed)
Called patient to remind of Wound Care appointment tomorrow, October 23 at 2.  Patient also was scheduled for apt with Dr. Alvis Lemmings on 10/24 at 0950. She states she no longer sees Dr. Alvis Lemmings as a patient. She started going to [facility]. Patient advised to follow up with [facility] and her appt will be canceled with [facility].   Patient verbalized understanding.    Called [facility] and advised to call [facility] to report finding on patient. Did not leave name of patient on the voicemail. Just advised to call [phone] if she had additional questions.

## 2023-09-05 NOTE — Progress Notes (Signed)
Paramedicine Encounter    Patient ID: [name], female    DOB: 04/12/1961, 62 y.o.   MRN: [id number]   Complaints-chronic pain--ulcer on foot is now black-she does go to wound care tomor   Edema-yes   Compliance with meds-yes  Pill box filled-yes If so, by whom-paramedic   Refills needed- Asked for refills last week-- Never got them--asked pharm again-- I will p/u and bring back out thursday  Metoprolol-has it thru Thursday pm dose    Pt reports she noticed a black spot/scab on her left foot. She is going to wound doc tomor. HHN was out here yesterday and seen it and called the wound doc office but have not heard back-I suspect its b/c she has appoint tomor  She reports her breathing is doing ok, she is wheezy this morning-have not used inhaler yet.  No issues with urination.  No bleeding issues.  Meds verified and pill box refilled. She did say that eventually she would like to go back to bubble packs. But that is probably at least another month or 2 away from moving to that--have to see how these wound checks go and upcoming appointments.     [facility]-209 BP (!) 158/62   Pulse 65   Resp 18   LMP  (LMP Unknown)   SpO2 98%  Weight yesterday-? Last visit weight-? Can't weigh to put pressure on that foot   Patient Care Team: [name], MD as PCP - General (Family Medicine) Runell Gess, MD as PCP - Cardiology (Cardiology) Lanier Prude, MD as PCP - Electrophysiology (Cardiology) Runell Gess, MD as Consulting Physician (Cardiology) Nyoka Cowden, MD as Consulting Physician (Pulmonary Disease) System, Provider Not In  Patient Active Problem List   Diagnosis Date Noted   Severe sepsis with acute organ dysfunction ([name]) 07/27/2023   Urinary tract infection without hematuria 07/27/2023   Encephalopathy acute 07/27/2023   Acute renal failure superimposed on stage 3 chronic kidney disease ([name]) 07/27/2023   Severe sepsis ([name]) 07/27/2023    Pre-ulcerative calluses 04/25/2022   Viral gastroenteritis 04/18/2022   Hyperlipidemia associated with type 2 diabetes mellitus ([name]) 07/29/2021   Uncontrolled type 2 diabetes mellitus with hyperglycemia ([name]) 06/13/2021   Acute kidney injury superimposed on CKD ([name]) 06/13/2021   Critical ischemia of foot ([name]) 06/07/2021   Fall 05/05/2021   PAF (paroxysmal atrial fibrillation) ([name]) 05/05/2021   COPD (chronic obstructive pulmonary disease) ([name]) 05/05/2021   DM (diabetes mellitus), type 2 with complications ([name]) 05/05/2021   PAD (peripheral artery disease) ([name]) 05/05/2021   Cellulitis 05/05/2021   Acute on chronic combined systolic and diastolic CHF (congestive heart failure) ([name]) 05/05/2021   OSA (obstructive sleep apnea) 05/05/2021   Stage 3b chronic kidney disease ([name]) 05/05/2021   AKI (acute kidney injury) ([name]) 05/04/2021   Pressure injury of skin 05/04/2021   Ulcer of foot, unspecified laterality, limited to breakdown of skin ([name]) 02/17/2021   AF (paroxysmal atrial fibrillation) ([name]) 01/02/2021   CAD (coronary artery disease) 01/02/2021   PVD (peripheral vascular disease) ([name]) 01/02/2021   Diabetes mellitus type 2, uncontrolled, with complications 01/02/2021   Diabetic nephropathy ([name]) 01/02/2021   Low back pain 06/01/2020   Hyperlipidemia 04/03/2020   Muscle weakness 03/20/2020   Pain in elbow 03/12/2020   PAD (peripheral artery disease) ([name]) 12/26/2019   Acute renal failure ([name])    Acute respiratory failure with hypoxia ([name]) 12/02/2019   Cellulitis in diabetic foot ([name]) 07/08/2019   Osteomyelitis ([name]) 06/21/2019  NICM (nonischemic cardiomyopathy) ([name]) 06/20/2019   Non-healing ulcer ([name]) 06/20/2019   CHF (congestive heart failure) ([name]) 11/26/2018   Coagulation disorder ([name]) 08/09/2017   Depression 07/21/2017   At risk for adverse drug reaction 06/20/2017   Peripheral neuropathy 06/20/2017   S/P transmetatarsal amputation of foot, right ([name])  06/05/2017   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) 05/19/2017   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[name])    SIRS (systemic inflammatory response syndrome) ([name]) 04/06/2017   Suspected sleep apnea 11/24/2016   Ulcer of toe of right foot, with necrosis of bone ([name]) 10/27/2016   Ulcer of left lower leg ([name]) 05/19/2016   Severe obesity (BMI >= 40) ([name]) 02/24/2016   Morbid obesity ([name]) 02/24/2016   Encounter for therapeutic drug monitoring 02/10/2016   Symptomatic bradycardia 01/12/2016   Hypertension associated with diabetes ([name]) 12/22/2015   Chronic combined systolic and diastolic CHF (congestive heart failure) ([name])    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 11/08/2015   Hypokalemia 11/08/2015   Tobacco abuse 10/23/2015   Coronary artery disease    DOE (dyspnea on exertion) 04/29/2015   Diabetes mellitus type 2, uncontrolled 02/08/2015   Influenza A 02/07/2015   Wrist fracture, left, closed, initial encounter 01/29/2015   PAF (paroxysmal atrial fibrillation) ([name]) 01/16/2015   Carotid arterial disease ([name]) 01/16/2015   Claudication ([name]) 01/15/2015   Demand ischemia ([name]) 10/29/2014   Insomnia 02/03/2014   S/P peripheral artery angioplasty - TurboHawk atherectomy; R SFA 09/11/2013    Class: Acute   Leg pain, bilateral 08/19/2013   Hypothyroidism 07/31/2013   Cellulitis 06/13/2013   History of cocaine abuse ([name]) 06/13/2013   Long term current use of anticoagulant therapy 05/20/2013   Alcohol abuse    Narcotic abuse ([name])    Marijuana abuse    Alcoholic cirrhosis ([name])    DM (diabetes mellitus), type 2 with peripheral vascular complications ([name])     Current Outpatient Medications:    ACCU-CHEK GUIDE test strip, USE TO CHECK BLOOD SUGAR FOUR TIMES DAILY, Disp: , Rfl:    acetaminophen (TYLENOL) 500 MG tablet, Take 1,000 mg by mouth every 6 (six) hours as needed for headache (pain)., Disp: , Rfl:    albuterol (VENTOLIN HFA) 108 (90  Base) MCG/ACT inhaler, USE 2 PUFFS BY MOUTH EVERY FOUR HOURS, AS NEEDED, FOR COUGHING/WHEEZING, Disp: 8.5 g, Rfl: 0   apixaban (ELIQUIS) 5 MG TABS tablet, Take 1 tablet (5 mg total) by mouth 2 (two) times daily., Disp: 60 tablet, Rfl: 6   budesonide-formoterol (SYMBICORT) 160-4.5 MCG/ACT inhaler, Inhale 2 puffs into the lungs 2 (two) times daily., Disp: 1 each, Rfl: 2   buPROPion ER (WELLBUTRIN SR) 100 MG 12 hr tablet, Take 1 tablet (100 mg total) by mouth daily., Disp: 30 tablet, Rfl: 6   Cholecalciferol (VITAMIN D3 PO), Take 1 capsule by mouth every morning., Disp: , Rfl:    icosapent Ethyl (VASCEPA) 1 g capsule, Take 2 capsules (2 g total) by mouth 2 (two) times daily., Disp: 120 capsule, Rfl: 6   levothyroxine (SYNTHROID) 50 MCG tablet, TAKE 1 TABLET (50 MCG TOTAL) BY MOUTH DAILY BEFORE BREAKFAST (AM), Disp: 30 tablet, Rfl: 0   losartan (COZAAR) 25 MG tablet, TAKE 2 TABLETS (50 MG TOTAL) BY MOUTH DAILY (AM), Disp: 60 tablet, Rfl: 3   metoprolol tartrate (LOPRESSOR) 25 MG tablet, Take 0.5 tablets (12.5 mg total) by mouth 2 (two) times daily., Disp: 60 tablet, Rfl: 1   pantoprazole (PROTONIX) 40 MG tablet, TAKE  1 TABLET (40 MG TOTAL) BY MOUTH DAILY(AM), Disp: 30 tablet, Rfl: 0   rosuvastatin (CRESTOR) 40 MG tablet, TAKE 1 TABLET (40 MG TOTAL) BY MOUTH DAILY (BEDTIME), Disp: 90 tablet, Rfl: 0   torsemide (DEMADEX) 100 MG tablet, TAKE 1 TABLET (100 MG TOTAL) BY MOUTH 2 (TWO) TIMES DAILY. (AM+BEDTIME), Disp: 60 tablet, Rfl: 3   VITAMIN E PO, Take 1 tablet by mouth daily., Disp: , Rfl:    colchicine 0.6 MG tablet, Take 1.2 mg by mouth See admin instructions. Take 1.2 mg for flair up and an hour later take 0.6 mg if needed, Disp: , Rfl:    doxycycline (VIBRAMYCIN) 100 MG capsule, Take 100 mg by mouth 2 (two) times daily. (Patient not taking: Reported on 09/05/2023), Disp: , Rfl:    EASY COMFORT PEN NEEDLES 31G X 5 MM MISC, USE FOUR TIMES A DAY FOR INSULIN ADMINISTRATION, Disp: 100 each, Rfl: 1   insulin  lispro (HUMALOG) 100 UNIT/ML KwikPen, Inject 10 Units into the skin 3 (three) times daily with meals., Disp: 15 mL, Rfl: 0   LANTUS SOLOSTAR 100 UNIT/ML Solostar Pen, Inject 37 Units into the skin daily. (Patient taking differently: Inject 40 Units into the skin daily.), Disp: 15 mL, Rfl: 0   metolazone (ZAROXOLYN) 2.5 MG tablet, TO BE USED AS DIRECTED BY HEART FAILURE CLINIC (Patient not taking: Reported on 08/23/2023), Disp: 10 tablet, Rfl: 0   nystatin cream (MYCOSTATIN), Apply topically 2 (two) times daily., Disp: 30 g, Rfl: 0   Omega-3 Fatty Acids (FISH OIL PO), Take 1 capsule by mouth every morning., Disp: , Rfl:    polyethylene glycol (MIRALAX / GLYCOLAX) 17 g packet, Take 17 g by mouth daily as needed for moderate constipation., Disp: 14 each, Rfl: 0   triamcinolone ointment (KENALOG) 0.1 %, Apply topically 2 (two) times daily., Disp: 454 g, Rfl: 0 Allergies  Allergen Reactions   Gabapentin Nausea And Vomiting and Other (See Comments)    POSSIBLE SHAKING   Lyrica [Pregabalin] Other (See Comments)    Shaking       Social History   Socioeconomic History   Marital status: Single    Spouse name: Not on file   Number of children: 1   Years of education: 12   Highest education level: 12th grade  Occupational History   Occupation: disabled  Tobacco Use   Smoking status: Former    Current packs/day: 1.00    Average packs/day: 1 pack/day for 44.0 years (44.0 ttl pk-yrs)    Types: E-cigarettes, Cigarettes   Smokeless tobacco: Former    Types: Snuff  Vaping Use   Vaping status: Former   Devices: 11/26/2018 "stopped months ago"  Substance and Sexual Activity   Alcohol use: Not Currently    Comment: occ   Drug use: Not Currently    Types: "Crack" cocaine, Marijuana, Oxycodone   Sexual activity: Not Currently  Other Topics Concern   Not on file  Social History Narrative   ** Merged History Encounter **       Lives in [place], in motel with sister.  They are looking to move but  don't have a place to go yet.     Social Determinants of Health   Financial Resource Strain: Low Risk  (02/09/2023)   Overall Financial Resource Strain (CARDIA)    Difficulty of Paying Living Expenses: Not hard at all  Food Insecurity: No Food Insecurity (02/09/2023)   Hunger Vital Sign    Worried About Running Out of Food  in the Last Year: Never true    Ran Out of Food in the Last Year: Never true  Transportation Needs: No Transportation Needs (02/09/2023)   PRAPARE - Administrator, Civil Service (Medical): No    Lack of Transportation (Non-Medical): No  Physical Activity: Inactive (02/09/2023)   Exercise Vital Sign    Days of Exercise per Week: 0 days    Minutes of Exercise per Session: 0 min  Stress: No Stress Concern Present (02/09/2023)   [employer] of Occupational Health - Occupational Stress Questionnaire    Feeling of Stress : Not at all  Social Connections: Unknown (08/21/2023)   Received from [facility]   Social Network    Social Network: Not on file  Intimate Partner Violence: Unknown (08/21/2023)   Received from [facility]   HITS    Physically Hurt: Not on file    Insult or Talk Down To: Not on file    Threaten Physical Harm: Not on file    Scream or Curse: Not on file    Physical Exam      Future Appointments  Date Time Provider Department Center  09/06/2023  2:00 PM Allen Derry [place] III, Cordelia Poche [facility]  09/07/2023  9:50 AM [name], MD [facility] None  09/12/2023  7:30 AM [facility] 1 [facility]  09/12/2023  8:00 AM [facility] 1 [facility]  09/12/2023  8:40 AM Leonie Douglas, MD [facility]  09/13/2023 12:30 PM Allen Derry [place] III, PA-C [facility]  09/20/2023 12:30 PM Allen Derry [place] III, PA-C [facility]  09/27/2023 12:30 PM Allen Derry [place] III, PA-C [facility]  10/03/2023  2:20 PM Laurey Morale, MD [facility] None  02/12/2024  4:00 PM [facility] VISIT [facility] None       Kerry Hough, Paramedic [phone] [facility] Paramedic  09/05/23

## 2023-09-05 NOTE — Telephone Encounter (Signed)
[facility] is calling in checking on the status of a medication request they faxed over on 10/15 and they haven't heard anything back. They said they are going to fax the request over again.

## 2023-09-05 NOTE — Telephone Encounter (Signed)
Pt has a calloused wound on her left foot that is discolored/black. Reported by [facility] from [facility]  Best contact: [phone]

## 2023-09-06 ENCOUNTER — Encounter (HOSPITAL_BASED_OUTPATIENT_CLINIC_OR_DEPARTMENT_OTHER): Payer: 59 | Admitting: Physician Assistant

## 2023-09-06 DIAGNOSIS — I87331 Chronic venous hypertension (idiopathic) with ulcer and inflammation of right lower extremity: Secondary | ICD-10-CM | POA: Diagnosis not present

## 2023-09-06 NOTE — Telephone Encounter (Signed)
[facility] is trying to pull over Ms. [name]'s rxns without her consent. Per the patient and her paramedicine team, Ms. [name] uses [facility].

## 2023-09-07 ENCOUNTER — Other Ambulatory Visit (HOSPITAL_COMMUNITY): Payer: Self-pay

## 2023-09-07 ENCOUNTER — Ambulatory Visit: Payer: 59 | Admitting: Family Medicine

## 2023-09-07 ENCOUNTER — Telehealth (HOSPITAL_COMMUNITY): Payer: Self-pay | Admitting: Licensed Clinical Social Worker

## 2023-09-07 NOTE — Telephone Encounter (Signed)
CSW reached out to pt to get updates on housing and to inquire about progress with food stamps and Medicare Savings Program- unable to reach- left VM requesting return call  Burna Sis, LCSW Clinical Social Worker [facility] Desk#: [phone] Cell#: [phone]

## 2023-09-07 NOTE — Telephone Encounter (Signed)
H&V Care Navigation CSW Progress Note  Clinical Social Worker received return call.  Pt reports things are moving forward with housing and they have turned in more paperwork- no move in date or confirmation yet.  She has recertified for medicare savings program and awaiting determination  Encouraged again to apply for food stamps- she will plan to go in person to [facility] to do so.   SDOH Screenings   Food Insecurity: No Food Insecurity (02/09/2023)  Housing: Low Risk  (02/09/2023)  Transportation Needs: No Transportation Needs (02/09/2023)  Utilities: Not At Risk (02/09/2023)  Alcohol Screen: Low Risk  (01/28/2020)  Depression (PHQ2-9): Low Risk  (02/09/2023)  Financial Resource Strain: Low Risk  (02/09/2023)  Physical Activity: Inactive (02/09/2023)  Social Connections: Unknown (08/21/2023)   Received from [facility]  Stress: No Stress Concern Present (02/09/2023)  Tobacco Use: Medium Risk (08/16/2023)   Burna Sis, LCSW Clinical Social Worker [facility] Desk#: [phone] Cell#: [phone]

## 2023-09-07 NOTE — Progress Notes (Signed)
Paramedicine Encounter    Patient ID: [name], female    DOB: [birth year], 62 y.o.   MRN: [id number]   Came out for med rec.  Metoprolol place where needed.     Patient Care Team: [name], MD as PCP - General (Family Medicine) Runell Gess, MD as PCP - Cardiology (Cardiology) Lanier Prude, MD as PCP - Electrophysiology (Cardiology) Runell Gess, MD as Consulting Physician (Cardiology) Nyoka Cowden, MD as Consulting Physician (Pulmonary Disease) System, Provider Not In  Patient Active Problem List   Diagnosis Date Noted  . Severe sepsis with acute organ dysfunction ([name]) 07/27/2023  . Urinary tract infection without hematuria 07/27/2023  . Encephalopathy acute 07/27/2023  . Acute renal failure superimposed on stage 3 chronic kidney disease ([name]) 07/27/2023  . Severe sepsis ([name]) 07/27/2023  . Pre-ulcerative calluses 04/25/2022  . Viral gastroenteritis 04/18/2022  . Hyperlipidemia associated with type 2 diabetes mellitus ([name]) 07/29/2021  . Uncontrolled type 2 diabetes mellitus with hyperglycemia ([name]) 06/13/2021  . Acute kidney injury superimposed on CKD ([name]) 06/13/2021  . Critical ischemia of foot ([name]) 06/07/2021  . Fall 05/05/2021  . PAF (paroxysmal atrial fibrillation) ([name]) 05/05/2021  . COPD (chronic obstructive pulmonary disease) ([name]) 05/05/2021  . DM (diabetes mellitus), type 2 with complications ([name]) 05/05/2021  . PAD (peripheral artery disease) ([name]) 05/05/2021  . Cellulitis 05/05/2021  . Acute on chronic combined systolic and diastolic CHF (congestive heart failure) ([name]) 05/05/2021  . OSA (obstructive sleep apnea) 05/05/2021  . Stage 3b chronic kidney disease ([name]) 05/05/2021  . AKI (acute kidney injury) ([name]) 05/04/2021  . Pressure injury of skin 05/04/2021  . Ulcer of foot, unspecified laterality, limited to breakdown of skin ([name]) 02/17/2021  . AF (paroxysmal atrial fibrillation) ([name]) 01/02/2021  . CAD (coronary artery  disease) 01/02/2021  . PVD (peripheral vascular disease) ([name]) 01/02/2021  . Diabetes mellitus type 2, uncontrolled, with complications 01/02/2021  . Diabetic nephropathy ([name]) 01/02/2021  . Low back pain 06/01/2020  . Hyperlipidemia 04/03/2020  . Muscle weakness 03/20/2020  . Pain in elbow 03/12/2020  . PAD (peripheral artery disease) ([name]) 12/26/2019  . Acute renal failure ([name])   . Acute respiratory failure with hypoxia ([name]) 12/02/2019  . Cellulitis in diabetic foot ([name]) 07/08/2019  . Osteomyelitis ([name]) 06/21/2019  . NICM (nonischemic cardiomyopathy) ([name]) 06/20/2019  . Non-healing ulcer ([name]) 06/20/2019  . CHF (congestive heart failure) ([name]) 11/26/2018  . Coagulation disorder ([name]) 08/09/2017  . Depression 07/21/2017  . At risk for adverse drug reaction 06/20/2017  . Peripheral neuropathy 06/20/2017  . S/P transmetatarsal amputation of foot, right ([name]) 06/05/2017  . 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[name])   . SIRS (systemic inflammatory response syndrome) ([name]) 04/06/2017  . Suspected sleep apnea 11/24/2016  . Ulcer of toe of right foot, with necrosis of bone ([name]) 10/27/2016  . Ulcer of left lower leg ([name]) 05/19/2016  . Severe obesity (BMI >= 40) ([name]) 02/24/2016  . Morbid obesity ([name]) 02/24/2016  . Encounter for therapeutic drug monitoring 02/10/2016  . Symptomatic bradycardia 01/12/2016  . Hypertension associated with diabetes ([name]) 12/22/2015  . Chronic combined systolic and diastolic CHF (congestive heart failure) ([name])   . 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 Hypokalemia 11/08/2015  . Tobacco abuse 10/23/2015  . Coronary artery disease   . DOE (dyspnea on exertion) 04/29/2015  . Diabetes mellitus type 2, uncontrolled 02/08/2015  . Influenza A  02/07/2015  . Wrist fracture, left, closed, initial encounter 01/29/2015  . PAF (paroxysmal atrial  fibrillation) ([name]) 01/16/2015  . Carotid arterial disease ([name]) 01/16/2015  . Claudication ([name]) 01/15/2015  . Demand ischemia ([name]) 10/29/2014  . Insomnia 02/03/2014  . S/P peripheral artery angioplasty - TurboHawk atherectomy; R SFA 09/11/2013    Class: Acute  . Leg pain, bilateral 08/19/2013  . Hypothyroidism 07/31/2013  . Cellulitis 06/13/2013  . History of cocaine abuse ([name]) 06/13/2013  . Long term current use of anticoagulant therapy 05/20/2013  . Alcohol abuse   . Narcotic abuse ([name])   . Marijuana abuse   . Alcoholic cirrhosis ([name])   . DM (diabetes mellitus), type 2 with peripheral vascular complications ([name])     Current Outpatient Medications:  .  ACCU-CHEK GUIDE test strip, USE TO CHECK BLOOD SUGAR FOUR TIMES DAILY, Disp: , Rfl:  .  acetaminophen (TYLENOL) 500 MG tablet, Take 1,000 mg by mouth every 6 (six) hours as needed for headache (pain)., Disp: , Rfl:  .  albuterol (VENTOLIN HFA) 108 (90 Base) MCG/ACT inhaler, USE 2 PUFFS BY MOUTH EVERY FOUR HOURS, AS NEEDED, FOR COUGHING/WHEEZING, Disp: 8.5 g, Rfl: 0 .  apixaban (ELIQUIS) 5 MG TABS tablet, Take 1 tablet (5 mg total) by mouth 2 (two) times daily., Disp: 60 tablet, Rfl: 6 .  budesonide-formoterol (SYMBICORT) 160-4.5 MCG/ACT inhaler, Inhale 2 puffs into the lungs 2 (two) times daily., Disp: 1 each, Rfl: 2 .  buPROPion ER (WELLBUTRIN SR) 100 MG 12 hr tablet, Take 1 tablet (100 mg total) by mouth daily., Disp: 30 tablet, Rfl: 6 .  Cholecalciferol (VITAMIN D3 PO), Take 1 capsule by mouth every morning., Disp: , Rfl:  .  colchicine 0.6 MG tablet, Take 1.2 mg by mouth See admin instructions. Take 1.2 mg for flair up and an hour later take 0.6 mg if needed, Disp: , Rfl:  .  doxycycline (VIBRAMYCIN) 100 MG capsule, Take 100 mg by mouth 2 (two) times daily. (Patient not taking: Reported on 09/05/2023), Disp: , Rfl:  .  EASY COMFORT PEN NEEDLES 31G X 5 MM MISC, USE FOUR TIMES A DAY FOR INSULIN ADMINISTRATION, Disp: 100 each,  Rfl: 1 .  icosapent Ethyl (VASCEPA) 1 g capsule, Take 2 capsules (2 g total) by mouth 2 (two) times daily., Disp: 120 capsule, Rfl: 6 .  insulin lispro (HUMALOG) 100 UNIT/ML KwikPen, Inject 10 Units into the skin 3 (three) times daily with meals., Disp: 15 mL, Rfl: 0 .  LANTUS SOLOSTAR 100 UNIT/ML Solostar Pen, Inject 37 Units into the skin daily. (Patient taking differently: Inject 40 Units into the skin daily.), Disp: 15 mL, Rfl: 0 .  levothyroxine (SYNTHROID) 50 MCG tablet, TAKE 1 TABLET (50 MCG TOTAL) BY MOUTH DAILY BEFORE BREAKFAST (AM), Disp: 30 tablet, Rfl: 0 .  losartan (COZAAR) 25 MG tablet, TAKE 2 TABLETS (50 MG TOTAL) BY MOUTH DAILY (AM), Disp: 60 tablet, Rfl: 3 .  metolazone (ZAROXOLYN) 2.5 MG tablet, TO BE USED AS DIRECTED BY HEART FAILURE CLINIC (Patient not taking: Reported on 08/23/2023), Disp: 10 tablet, Rfl: 0 .  metoprolol tartrate (LOPRESSOR) 25 MG tablet, Take 0.5 tablets (12.5 mg total) by mouth 2 (two) times daily., Disp: 60 tablet, Rfl: 1 .  nystatin cream (MYCOSTATIN), Apply topically 2 (two) times daily., Disp: 30 g, Rfl: 0 .  Omega-3 Fatty Acids (FISH OIL PO), Take 1 capsule by mouth every morning., Disp: , Rfl:  .  pantoprazole (PROTONIX) 40 MG tablet, TAKE 1 TABLET (40 MG  TOTAL) BY MOUTH DAILY(AM), Disp: 30 tablet, Rfl: 0 .  polyethylene glycol (MIRALAX / GLYCOLAX) 17 g packet, Take 17 g by mouth daily as needed for moderate constipation., Disp: 14 each, Rfl: 0 .  rosuvastatin (CRESTOR) 40 MG tablet, TAKE 1 TABLET (40 MG TOTAL) BY MOUTH DAILY (BEDTIME), Disp: 90 tablet, Rfl: 0 .  torsemide (DEMADEX) 100 MG tablet, TAKE 1 TABLET (100 MG TOTAL) BY MOUTH 2 (TWO) TIMES DAILY. (AM+BEDTIME), Disp: 60 tablet, Rfl: 3 .  triamcinolone ointment (KENALOG) 0.1 %, Apply topically 2 (two) times daily., Disp: 454 g, Rfl: 0 .  VITAMIN E PO, Take 1 tablet by mouth daily., Disp: , Rfl:  Allergies  Allergen Reactions  . Gabapentin Nausea And Vomiting and Other (See Comments)    POSSIBLE  SHAKING  . Lyrica [Pregabalin] Other (See Comments)    Shaking       Social History   Socioeconomic History  . Marital status: Single    Spouse name: Not on file  . Number of children: 1  . Years of education: 48  . Highest education level: 12th grade  Occupational History  . Occupation: disabled  Tobacco Use  . Smoking status: Former    Current packs/day: 1.00    Average packs/day: 1 pack/day for 44.0 years (44.0 ttl pk-yrs)    Types: E-cigarettes, Cigarettes  . Smokeless tobacco: Former    Types: Snuff  Vaping Use  . Vaping status: Former  . Devices: 11/26/2018 "stopped months ago"  Substance and Sexual Activity  . Alcohol use: Not Currently    Comment: occ  . Drug use: Not Currently    Types: "Crack" cocaine, Marijuana, Oxycodone  . Sexual activity: Not Currently  Other Topics Concern  . Not on file  Social History Narrative   ** Merged History Encounter **       Lives in [place], in motel with sister.  They are looking to move but don't have a place to go yet.     Social Determinants of Health   Financial Resource Strain: Low Risk  (02/09/2023)   Overall Financial Resource Strain (CARDIA)   . Difficulty of Paying Living Expenses: Not hard at all  Food Insecurity: No Food Insecurity (02/09/2023)   Hunger Vital Sign   . Worried About Programme researcher, broadcasting/film/video in the Last Year: Never true   . Ran Out of Food in the Last Year: Never true  Transportation Needs: No Transportation Needs (02/09/2023)   PRAPARE - Transportation   . Lack of Transportation (Medical): No   . Lack of Transportation (Non-Medical): No  Physical Activity: Inactive (02/09/2023)   Exercise Vital Sign   . Days of Exercise per Week: 0 days   . Minutes of Exercise per Session: 0 min  Stress: No Stress Concern Present (02/09/2023)   [employer] of Occupational Health - Occupational Stress Questionnaire   . Feeling of Stress : Not at all  Social Connections: Unknown (08/21/2023)   Received from [facility]   Social Network   . Social Network: Not on file  Intimate Partner Violence: Unknown (08/21/2023)   Received from [facility]   HITS   . Physically Hurt: Not on file   . Insult or Talk Down To: Not on file   . Threaten Physical Harm: Not on file   . Scream or Curse: Not on file    Physical Exam      Future Appointments  Date Time Provider Department Center  09/07/2023  9:50 AM Newlin,  [name], MD [facility] None  09/12/2023  7:30 AM [facility] 1 [facility]  09/12/2023  8:00 AM [facility]  09/12/2023  8:40 AM Leonie Douglas, MD [facility]  09/13/2023 12:30 PM Allen Derry [place]oul[place] III, PA-C [facility]  09/20/2023 12:30 PM Allen Derry [place] III, PA-C [facility]  09/27/2023 12:30 PM Allen Derry [place] III, PA-C [facility]  10/03/2023  2:20 PM Laurey Morale, MD [facility] None  02/12/2024  4:00 PM [facility] VISIT [facility] None       Kerry Hough, Paramedic [phone] [facility] Paramedic  09/07/23

## 2023-09-08 ENCOUNTER — Other Ambulatory Visit: Payer: Self-pay

## 2023-09-08 NOTE — Progress Notes (Addendum)
[name], [name] ([id number]) [phone].pdf Page 1 of 13 Visit Report for 09/06/2023 Chief Complaint Document Details Patient Name: Date of Service: [name] 09/06/2023 2:00 PM Medical Record Number: [id number] Patient Account Number: [id number] Date of Birth/Sex: Treating RN: July 07, 1961 (62 y.o. F) Primary Care Provider: [name] Other Clinician: Referring Provider: Treating Provider/Extender: [name], [name] Weeks in Treatment: 20 Information Obtained from: Patient Chief Complaint Bilateral LE Ulcers Electronic Signature(Price) Signed: 09/06/2023 2:41:07 PM By: Allen Derry PA-C Entered By: Allen Derry on 09/06/2023 14:41:07 -------------------------------------------------------------------------------- HPI Details Patient Name: Date of Service: [name], [name]. 09/06/2023 2:00 PM Medical Record Number: [id number] Patient Account Number: [id number] Date of Birth/Sex: Treating RN: [birth year] (61 y.o. F) Primary Care Provider: [name] Other Clinician: Referring Provider: Treating Provider/Extender: [name], [name] Weeks in Treatment: 20 History of Present Illness HPI Description: This 62 year old patient who has a very long significant history of diabetes mellitus, previous alcohol and nicotine abuse, chronic data disease, COPD, diabetes mellitus, height hypertension, critical lower limb ischemia with several wounds being managed at the wound Center at [facility] for over a year. She was recently in the ER at [facility] and was referred to our center. He has had a long history of critical limb ischemia and over a period of time has had balloon angioplasties in March 2016, endarterectomy of the left carotid, lower extremity angiogram and treatment by Dr. Nanetta Batty, several cardiac catheterizations. Most recently she had an x-ray of her left foot while in the ER which showed no acute  abnormality. during this ER visit she was started on ciprofloxacin and asked to continue with the wound care physicians at [facility]. Her last ABI done in June 2017 showed the right side to be 0.28 in the left side to be 0.48. Her right toe brachial index was 0.17 on the right and 0.27 on the left. her last hemoglobin A1c was 11.1% She continues to smoke about a pack of cigarettes a day. 03/28/2017 -- -- right foot x-ray -- IMPRESSION:Areas of soft tissue swelling. Mild subluxation second PIP joint. No frank dislocation or fracture. No erosive change or bony destruction. No soft tissue ulceration or radiopaque foreign body evident by radiography. There is plantar fascia calcification with a nearby inferior calcaneal spur. There are foci of arterial vascular calcification. 04/04/2017 -- the patient continues to be noncompliant and continues to complain of a lot of pain and has not done anything about quitting smoking Readmission: 06/26/18 on evaluation today patient presents for reevaluation she has not been seen in our office since May 2018. Since she was last seen here in our office she has undergone testing at Dr. Durenda Age office where it was revealed that she had an ABI of 0.68 with her previous ABI being 0.64 and a left ABI of 0.38 with previous ABI being 0.34 this study which was performed on 04/05/18 was pretty much equivalent to the study performed on 11/22/17. The findings in the end revealed on the right would appear to be moderate right lower extremity arterial disease on the left Dr. Allyson Sabal states moderate in the report but unfortunately this appears to be much more severe at 0.38 compared to the right. Subsequently the patient was scheduled to have angiography with Dr. Allyson Sabal on 04/12/18. This was however canceled due to the fact that the patient was found to have chronic kidney disease stage III and it was to the point that he did not  feel that it will be safe to pursue angiography at  that point. She has not been on any antibiotics recently. At this point Dr. Allyson Sabal has not rescheduled anything as far as the injured [place] according to the patient he is somewhat reluctant to Karla so. Nonetheless with her diminished blood flow this is gonna make it somewhat difficult for her to heal. 07/05/18; this is a patient I have not seen previously. She has very significant PAD as noted above. She apparently has had revascularization efforts by Dr. Allyson Sabal on the right on 3 occasions to the patient. She was supposed to have an attempted angiography on the left however this was canceled apparently [name], [name] ([id number]) [id number] Page 2 of 13 because of stage III chronic renal failure. I will need to research all of this. She complains of significant pain in the wound and has claudication enough that when she walks to the end of the driveway she has to stop. She is been using silver alginate to the wounds on her legs and Iodoflex to the area on her left second toe. 07/13/18 on evaluation today patient'Price wounds actually appear to be doing about the same. She has an appointment she tells me within the next month that is September 2019 with Dr. Allyson Sabal to discuss options to see if there'Price anything else that you can recommend or Karla for her. Nonetheless obviously what we're trying to Karla is Karla what we can to save her leg and in turn prevent any additional worsening or damage. None in the meantime we been mainly trying to manage her ulcers as best we can. 07/27/18; some improvement in the multiplicity of wounds on her left lower extremity and foot. She'Price been using silver alginate. I was unable to determine that she actually has an appointment with Dr. Allyson Sabal. We are checking into this The patient'Price wound includes Left lateral foot, left plantar heel, her left anterior calf wound looks close to me, left fourth toe is very close to closed and the left medial calf is perhaps  the largest open area READMISSION 02/19/2021 This is a now 62 year old woman with type 2 diabetes and continued cigarette smoking. She has known PAD. She was last in this clinic in 2019 at that point with wounds on her left foot. She left in a nonhealed state. On 06/28/2019 I see she had a left femoropopliteal by vein and vascular with an amputation of the fifth ray. These managed to heal over. Her last arterial studies were in February 2021. This showed an absent waveform at the posterior tibial artery on the right a dampened monophasic dorsalis pedis on the right of 0.44. On the left again the PTA TA was absent her dorsalis pedis was 0.99 triphasic and her great toe pressure was 0.65. This would have been after her revascularization. Once again she tells me that Dr. Allyson Sabal has done revascularization on the right leg on 3 different occasions. She has a right transmetatarsal amputation apparently done by Dr. Lajoyce Corners remotely but I Karla not see a note for these right lower extremity revascularization but I may not of looked back far enough. In any case she says that the she has a wound on the plantar aspect of the right transmetatarsal amputation site there is been there for several months. More recently she fell and had a wound on her right medial lower leg she had 5 sutures placed in the ER and then subsequently she has developed an area on the medial lower leg  which was a blister that opened up. Past medical history includes type 2 diabetes, PAD, chronic renal failure, congestive heart failure, right transmetatarsal amputation, left fifth ray amputation, continued tobacco abuse, atrial fibrillation and cirrhosis. We did not attempt an ABI on the right leg today because of pain 02/26/2021; patient we admitted to the clinic last week. She has what looks to be 2 areas on her right medial and right anterior lower leg that look more like venous wounds but she has 1 on the first met head at the base of her right  transmet. She has known severe PAD. She complains of a lot of pain although some of this may be neuropathic. Is difficult to exclude a component of claudication. Use silver alginate on the wounds on her legs and Iodoflex on the area on the first met head. She has an appointment with Dr. Allyson Sabal on 5/25 although I will text him and discussed the situation. She is have poorly controlled diabetic. She has has stage IIIb chronic renal failure 4/22; patient presents for 1 week follow-up. The 2 areas on her right medial and right anterior lower leg appear well-healing. She has been using silver alginate to this area without issues. She has a first met head ulcer and it is unclear how long this has been there as it was discovered in the ED earlier this month when she was being evaluated for another issue. Iodoflex has been used at this area. 4/29; patient presents for 1 week follow-up. She has been using silver alginate to the leg wounds and Iodoflex to the plantar foot wound. She has had this wrapped with Coban and Kerlix. She has no concerns or complaints today. 5/6; this is a difficult wound on the right plantar foot transmit site. We are not making a lot of progress. She had 2 more venous looking wounds on the right medial and right anterior lower leg 1 of which is healed. Her appointment with Dr. Allyson Sabal is on 5/25 5/16; she did not tolerate the offloading shoe we gave her in fact she had a fall with an abrasion on her right forearm she is now back in the modified small shoe which does not offload her foot properly. Her appoint with Dr. Allyson Sabal is still on 5/25 we have been using Iodoflex. The area on the right anterior lower leg is healed 7/18; patient presents for follow-up however has not been seen in 2 months. She was last seen at the end of May. She had a fall at the end of June and was hospitalized. She has been unable to follow-up since then. For the wound she has only been keeping it covered with  gauze. She is scheduled for an aortogram with lower extremity runoff at the end of July. 7/29; patient presents for 2-week follow-up. She has home health and they have been changing her dressings. She denies any issues and has no complaints today. She states she had a procedure where they opened up one of her vessels in her legs. She denies signs of infection. 8/5; patient presents for follow-up. She was recently in the hospital for bradycardia. She was noted to have cellulitis to the left lower extremity and Unna boot was placed due to blisters and increased swelling. She reports improvement to her left leg since being in the hospital and stability check her right plantar foot wound. She denies signs of infection. 8/23; since I last saw this patient a lot has happened. She still has the area over the  right first met head in the setting of previous transmetatarsal amputation. Firstly most importantly she underwent revascularization of her occluded right SFA. She underwent directional atherectomy followed by drug-coated balloon angioplasty. She has no named vessel below the knee hopefully the revascularization of the right SFA will improve collateral flow. It is not felt however that she has any endovascular options. She had follow-up arterial studies noninvasive on 8/9. These showed an ABI of 0.44 at the right PTA. Monophasic waveforms. On the left her great toe ABI was 0.68. Her follow-up arterial Doppler showed a 50 to 74% stenosis in the proximal SFA and a 50 to 74% stenosis in the distal SFA mild to severe atherosclerosis noted throughout the extremity. Areas of shadowing plaque seen, unable to rule out higher grade stenosis she also had a fall apparently wearing a right foot forefoot off loader that we gave her. She therefore comes in in an ordinary running shoe today. Not certain if this is the fall that ended up in the hospital with bradycardia 9/6 area over the right first metatarsal head in  the setting of her previous amputation and severe PAD. She is also a diabetic with known PAD status post attempt at revascularization. She is continued smoker Again the separation of visits in the clinic is somewhat disturbing if we are going to consider her for a total contact cast. She is not wearing anything to offload this stating it causes imbalance especially the forefoot off loader. She basically comes in in bedroom slippers She also had a fall this morning about an hour ago. She has a skin tear on the left dorsal forearm 9/13; areas over the right first metatarsal head in the setting of previous TMA and severe PAD. Again she comes in here in slippers. We have been using Hydrofera Blue. We use MolecuLight on this which was essentially negative study 9/20; right first metatarsal head in the setting of her previous TMA and severe PAD. Same slipper type shoes. She says she cannot wear a forefoot off loader and for some reason she will not wear surgical shoes. She says she is smoking half a pack per day. 9/30; right first metatarsal head. Absolutely no improvement in fact there is tunneling superiorly very close to bone. She does not offload this properly at all. We use MolecuLight on this 2 weeks ago that did not show any surface bacteria we have been using silver collagen is a dressing She tells me she is down to 3 cigarettes a day I have offered encouragement and a treatment plan 10/6; right first metatarsal head. Exposed bone this week which is not surprising. We have been using silver collagen. She is smoking 5 to 10 cigarettes a day 10/17; she has not been here in 10 days. The bone scraping that I did on 10/6 showed staph lugdunensis, staph epidermidis and Pseudomonas Alcalifenes. I put in for Septra DS 1 p.o. twice daily for 14 days last week would be but we could not reach her to start the antibiotic. Quinolones would have been a good alternative except she has multiple drug interactions.  Septra would not cover what ever the Pseudomonas is but I am not really sure of the relevance here. I am going to try her on the Septra for 2 weeks. She has a probing wound on the right TMA that probes to bone. She claims to have just about stop smoking [name], [name] ([id number]) [id number] Page 3 of 13 I reviewed her arterial status. She underwent a  left fem-tib bypass by Dr. Arbie Cookey in 06/28/2019. Her last angiogram was in July of this year by Dr. Allyson Sabal. This showed an occluded right SFA the popliteal and tibial vessels were also occluded the peroneal vessel filled by collaterals and filled the PT and DP by communicating collaterals. She was not felt to have any additional and vascular options. Dr. Allyson Sabal noted that she he had previously revascularized her right SFA 3 times. 10/25; she is tolerating the Septra but says it makes her nauseated. I am going to continue this for an additional 2 weeks perhaps for a total of 6 weeks. Her wound does not look too much different. There is on the right first met metatarsal head. There is probing areas around the tissue that is in the middle of the wound. There is no purulence I cannot feel bone. The original source was for a bone scraping. 11/8; right first metatarsal head. This does not probe to bone and there is no purulence. As far as I can tell she is completed the Septra although there were problems with exact length of time and I think compliance. In any case I gave her 6 weeks. I have reviewed her PAD above. 11/15; right first metatarsal head. Again protruding subcutaneous tissue but this does not have any adherence to surrounding skin. Undermining circumferentially. There is no palpable bone. Not grossly purulent. We have been using Iodoflex to try to get some adherence to clean off the surface but no real improvement. I Karla not think they actually had enough supplies listening to the patient. She completed the 6 weeks of Septra  I gave her, but I am not sure about the compliance with the dates i.e. may have missed days taken 1 a day etc. The patient has a vascular follow-up with Dr. Allyson Sabal this coming Friday i.e. in 3 days. By my read of arterial evaluations I Karla not think she is felt to be a candidate for any further revascularization. The last angiogram was in July. 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 She continues to smoke now up to 10 cigarettes a day. She is not in any pain. Wound has not improved but is certainly not worsened. I gave her 6 weeks of trimethoprim/sulfamethoxazole which she is completed in some fashion. 11/29 right first metatarsal head previous TMA. Wound actually looks somewhat better today still a lot of callus around the wound on debris on the wound surface. UNFORTUNATELY the patient missed her appointment with Dr. Allyson Sabal and is not rebooked apparently until sometime in February 12/13; right first metatarsal wound actually looks some smaller today. I did not debride this today. She is using Iodoflex. When she came in last week she had a large abscess on her left anterior lower leg apparently after falling we send her to the ER to have this drained which was done. I cannot see any cultures x-ray was negative. She was apparently given 2 doses of IV antibiotics and discharged on Bactrim DS twice daily which she is still taking. As mentioned I cannot see any cultures. 12/29; culture of the abscess on the left leg I did last time showed Serratia. We gave her antibiotics. I note she was in the ER next day with bleeding which they controlled she is on anticoagulants.She comes in today to clinic and the area on her right plantar foot is miraculously very hyperkeratotic but healed 02/06/2023 Ms. 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 She reports a callus to the right  first met head and on the lateral aspect of the left foot. She saw podiatry 3 days ago and had the callus debrided. There are no open wounds at this time. She follows up with podiatry in 2 weeks. Readmission: 04-19-2023 upon evaluation today patient appears to be doing well all things considered in regard to her wounds. She does have several wounds however on her feet 1 on the left foot centrally along the lateral edge and then a wound on the right lower leg more posterior. On the right foot plantar distal at the transmetatarsal amputation site this is the worst. With that being said she tells me this has been going on for quite some time she has been seen by podiatry they have done some debridements in the past. With that being said based on what we are seeing I believe that the patient'Price primary issue on the right side is that she is having some issues here with her arterial flow. The ABI on the right is 0.57 on the left is 1.04. With that being said this reading on the right is getting necessitated further evaluation by vascular. She tells me she has been seen by Dr. Gery Pray in the past and he stated there was nothing further to be done for the right leg. With that being said I think that it would be good to have a second opinion with vascular on this. Patient does have a history of diabetes mellitus type 2, peripheral vascular disease, and hypertension. The previous ABIs were performed on January 11, 2023. 04-26-2023 upon evaluation today patient appears to be doing about the same in regard to her wounds. She still has not had the arterial study at this point. I think this is something needs to be done as quickly as possible. Fortunately I Karla not see any evidence of active infection locally or systemically which is great news. No fevers, chills, nausea, vomiting, or diarrhea. 7/24; the patient has not been here in about a month and a half. She has open wounds on the plantar right foot second  metatarsal head in the setting of a previous TMA, she has a new area on the left lateral foot and perhaps traumatic areas on the right anterior and left lateral lower legs. She has poorly controlled edema. The patient has known severe PAD has had multiple interventions in the right SFA. 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 She had an pressure of 60 at the dorsalis pedis giving her an ABI of 0.57 monophasic waveforms here. On the left her study was a lot better with an ABI 1.04 triphasic waveforms. The ABI might be falsely elevated on the left. I think at her last angiogram she had occluded right SFA popliteal and tibial vessels with the peroneal vessel being the only vessel feeding her foot via collaterals. She comes in this time in basic over-the-counter shoes 06-14-2023 upon evaluation today patient appears to be doing well currently in regard to her wounds all things considered except for the left lateral foot where she has what appears to be a blister this is erythematous as well and concerned about infection. I discussed with her today that I Karla think we need to see about doing the Vi core culture and subsequently depending  on the results of this we will initiate antibiotic treatment as necessary. With that being said right now I think that she is going to need to be aggressive and elevating her legs she has an appointment with vascular coming up around 3 August is what I believe her appointment date is. Nonetheless once we get clearance from the standpoint of vascular she does have good blood flow and we can subsequently try to see what we Karla about getting her edema under much better control and then she really needs some much stronger compression than what we currently are able to Karla. 06-21-2023 upon evaluation today patient appears to be doing pretty well currently in regard to her  wounds most of which seem to be showing signs of improvement. She has been require little bit of debridement in regard to the right plantar foot wound I will Karla this as carefully as possible as we are still working on establishing that she has sufficient blood flow at this point. This right side on previous testing seem to be somewhat questionable. Fortunately I Karla not see any signs of active infection locally nor systemically at this time. 06-28-2023 patient does have an appointment with vascular on 07-11-2023. With that being said right now she has been having some slow progress but still nonetheless good progress at this point. I am actually very pleased with where we stand. I Karla not see any signs of active infection locally or systemically which is great news. No fevers, chills, nausea, vomiting, or diarrhea. 07-05-2023 upon evaluation today patient appears to be doing well currently in regard to her wound. She has been tolerating the dressing changes. She only has 1 wound left on the plantar aspect of the right foot. With that being said she does have a peg assist offloading shoe she has been using at this time. 8/28; patient with a wound over the plantar right foot metatarsal head. She was seen by Dr. Lenell Antu of vascular surgery. Noted that the right ABI was only 0.57 it was felt that she had progression versus a previous study. She had some stenosis in the SFA a 7599% stenosis in the proximal mid SFA mild stenosis in the distal SFA and mild stenosis in the proximal PTA. On the left she had a patent left femoral to ATA bypass graft with mild stenosis It was recommended that she undergo angiography for limb salvage on the right. That is being arranged as we speak. Her wound is on the right first metatarsal [name], [name] ([id number]) [id number] Page 4 of 13 head she has been using collagen as the primary dressing. She tells me she is making every effort to stop  smoking 07-19-2023 upon evaluation today patient appears to be doing okay in regard to her foot ulcer although her arterial procedure/revascularization keeps getting pushed back. She states that they are talking about pushing back and left nail due to the fact that she changed insurances and I Karla not have a being "updated insurance card number". With that being said I explained to the patient that should be something that she should be able to get on line easily by making an account. She is also called [place] [employer] and they said they were sending it but it would be about a week before described by next Wednesday. She really does not have time to be holding on this she needs to be proceeding with intervention as quickly as possible. 07-26-2023 upon evaluation today patient appears to be doing well currently  in regard to her wound. This is actually showing signs of looking about the same I did clear a lot of callus up last week this actually looks better to me from an overall visual standpoint than last week though it is a little larger because of the callus I had removed. With that being said I feel like she is actually making really good progress here towards complete closure. No fevers, chills, nausea, vomiting, or diarrhea. 08-09-2023 upon evaluation today patient presents for follow-up concerning her ongoing issues with her right plantar foot ulcer. She is going to be having arteriogram on Friday that is just just 2 days and hopefully following this it will actually allow for more aggressive healing in regard to the right plantar foot. Today I am going to perform some debridement to clear away some of the necrotic debris. 08-23-2023 upon evaluation patient'Price wound really appears to be doing about the same. She did have her arteriogram with Dr. Lenell Antu and that was on 9-27- 2024. Unfortunately it was noted that the patient had no good options for further revascularization. They plan to reevaluate  there in a month but she is stated to be at "high risk for major amputation". 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 Subsequently this means that there is really not good blood flow into the ankle and foot which means that the patient really does not have a good chance of getting this to heal. 08-30-2023 upon evaluation today patient appears to be doing well currently in regard to her wound. She has been tolerating the dressing changes without complication and in general I Karla feel that the patient is doing quite well at this time. Unfortunately with the blood flow being so low and really no real vascular options to improve blood flow she really has to try to stay off of this is much as possible. 09-06-2023 upon evaluation today patient appears to be doing well currently in regard to the wound on her plantar foot it does not seem to be any worse is not terribly better but again there is really not much we can Karla from a blood flow standpoint at this time unfortunately. There does not appear to be any signs of active infection locally or systemically which is good news. She does have a wound on the posterior ankle location and the right leg as well. She also has an area on the left lateral foot. Electronic Signature(Price) Signed: 09/06/2023 2:55:52 PM By: Allen Derry PA-C Entered By: Allen Derry on 09/06/2023 14:55:52 -------------------------------------------------------------------------------- Physical Exam Details Patient Name: Date of Service: Karla [name], [name]. 09/06/2023 2:00 PM Medical Record Number: [id number] Patient Account Number: [id number] Date of Birth/Sex: Treating RN: 05-12-1961 (62 y.o. F) Primary Care Provider: [name] Other Clinician: Referring Provider: Treating Provider/Extender: [name], [name] Weeks  in Treatment: 20 Constitutional Obese and well-hydrated in no acute distress. Respiratory normal breathing without difficulty. Psychiatric this patient is able to make decisions and demonstrates good insight into disease process. Alert and Oriented x 3. pleasant and cooperative. Notes Upon inspection patient'Price wound bed actually showed signs at all locations of doing quite well on the right. On the left this appears to be more of a blood blister where something must of rubbed it does not appear to be open but we will have to keep a close eye  on things. Electronic Signature(Price) Signed: 09/06/2023 2:56:10 PM By: Allen Derry PA-C Entered By: Allen Derry on 09/06/2023 14:56:10 Physician Orders Details -------------------------------------------------------------------------------- [name] ([id number]) [id number] Page 5 of 13 Patient Name: Date of Service: [name] 09/06/2023 2:00 PM Medical Record Number: [id number] Patient Account Number: [id number] Date of Birth/Sex: Treating RN: August 10, 1961 (62 y.o. [name] Primary Care Provider: [name] Other Clinician: Referring Provider: Treating Provider/Extender: [name], [name] Weeks in Treatment: 20 Verbal / Phone Orders: No Diagnosis Coding Follow-up Appointments ppointment in 1 week. Allen Derry PA Wednesday 09/13/23 @ 1230 Return A ppointment in 2 weeks. Allen Derry PA Wednesday Return A Return appointment in 3 weeks. Allen Derry PA Wednesday please schedule Return appointment in 1 month. Allen Derry PA Wednesday please schedule Anesthetic (In clinic) Topical Lidocaine 5% applied to wound bed Bathing/ Shower/ Hygiene May shower with protection but Karla not get wound dressing(Price) wet. Protect dressing(Price) with water repellant cover (for example, large plastic bag) or a cast cover and may then take shower. Edema Control - Lymphedema / SCD / Other Bilateral Lower  Extremities Elevate legs to the level of the heart or above for 30 minutes daily and/or when sitting for 3-4 times a day throughout the day. Avoid standing for long periods of time. Exercise regularly - As tolerated Moisturize legs daily. Other Edema Control Orders/Instructions: - Recommend Compression stocking 15-27mm/Hg left leg. Off-Loading Open toe surgical shoe to: - with Peg Assist in shoe. Limit walking on foot. Stay off the foot to minimize pressure to bottom of foot. Non Wound Condition Left Lower Extremity pply the following to affected area as directed: - left foot- apply betadine to affected area cover with ABD pad, secure with kerlix A Home Health New wound care orders this week; continue Home Health for wound care. May utilize formulary equivalent dressing for wound treatment orders unless otherwise specified. - home health twice a week. Wound Center weekly. primary dressing iodoflex or iodosorb to all wounds. Other Home Health Orders/Instructions: - [facility] *****Physical Therapy- non-weight bearing with right foot. Wound Treatment Wound #23 - Metatarsal head first Wound Laterality: Right Cleanser: Soap and Water 3 x Per Week/30 Days Discharge Instructions: May shower and wash wound with dial antibacterial soap and water prior to dressing change. Cleanser: Wound Cleanser 3 x Per Week/30 Days Discharge Instructions: Cleanse the wound with wound cleanser prior to applying a clean dressing using gauze sponges, not tissue or cotton balls. Peri-Wound Care: Sween Lotion (Moisturizing lotion) 3 x Per Week/30 Days Discharge Instructions: Apply moisturizing lotion as directed Prim Dressing: IODOFLEX 0.9% Cadexomer Iodine Pad 4x6 cm 3 x Per Week/30 Days ary Discharge Instructions: *****OR IODOSORB****Apply to wound bed as instructed Secondary Dressing: ABD Pad, 8x10 3 x Per Week/30 Days Discharge Instructions: Apply over primary dressing as directed. Secured With:  [employer], 4.5x3.1 (in/yd) 3 x Per Week/30 Days Discharge Instructions: Secure with Kerlix as directed. Secured With: 71M Medipore H Soft Cloth Surgical T ape, 4 x 10 (in/yd) 3 x Per Week/30 Days Discharge Instructions: Secure with tape as directed. Compression Wrap: tubigrip size E single layer 3 x Per Week/30 Days Discharge Instructions: apply in the morning and remove at night. Wound #29 - Lower Leg Wound Laterality: Right, Anterior Cleanser: Soap and Water 3 x Per Week/30 Days Discharge Instructions: May shower and wash wound with dial antibacterial soap and water prior to dressing change. Cleanser: Wound Cleanser 3 x Per  Week/30 Days Discharge Instructions: Cleanse the wound with wound cleanser prior to applying a clean dressing using gauze sponges, not tissue or cotton balls. REHAM, SLABAUGH ([id number]) [phone].pdf Page 6 of 13 Peri-Wound Care: Sween Lotion (Moisturizing lotion) 3 x Per Week/30 Days Discharge Instructions: Apply moisturizing lotion as directed Prim Dressing: IODOFLEX 0.9% Cadexomer Iodine Pad 4x6 cm 3 x Per Week/30 Days ary Discharge Instructions: *****OR IODOSORB****Apply to wound bed as instructed Secondary Dressing: ABD Pad, 8x10 3 x Per Week/30 Days Discharge Instructions: Apply over primary dressing as directed. Secured With: [employer], 4.5x3.1 (in/yd) 3 x Per Week/30 Days Discharge Instructions: Secure with Kerlix as directed. Secured With: 673M Medipore H Soft Cloth Surgical T ape, 4 x 10 (in/yd) 3 x Per Week/30 Days Discharge Instructions: Secure with tape as directed. Compression Wrap: tubigrip size E single layer 3 x Per Week/30 Days Discharge Instructions: apply in the morning and remove at night. Wound #30 - Lower Leg Wound Laterality: Right, Posterior, Distal Cleanser: Soap and Water 3 x Per Week/30 Days Discharge Instructions: May shower and wash wound with dial antibacterial soap and water prior to dressing  change. Cleanser: Wound Cleanser 3 x Per Week/30 Days Discharge Instructions: Cleanse the wound with wound cleanser prior to applying a clean dressing using gauze sponges, not tissue or cotton balls. Peri-Wound Care: Sween Lotion (Moisturizing lotion) 3 x Per Week/30 Days Discharge Instructions: Apply moisturizing lotion as directed Prim Dressing: IODOFLEX 0.9% Cadexomer Iodine Pad 4x6 cm 3 x Per Week/30 Days ary Discharge Instructions: *****OR IODOSORB****Apply to wound bed as instructed Secondary Dressing: ABD Pad, 8x10 3 x Per Week/30 Days Discharge Instructions: Apply over primary dressing as directed. Secured With: [employer], 4.5x3.1 (in/yd) 3 x Per Week/30 Days Discharge Instructions: Secure with Kerlix as directed. Secured With: 673M Medipore H Soft Cloth Surgical T ape, 4 x 10 (in/yd) 3 x Per Week/30 Days Discharge Instructions: Secure with tape as directed. Compression Wrap: tubigrip size E single layer 3 x Per Week/30 Days Discharge Instructions: apply in the morning and remove at night. Patient Medications llergies: gabapentin, Lyrica A Notifications Medication Indication Start End 09/06/2023 lidocaine DOSE topical 5 % ointment - ointment topical once daily Electronic Signature(Price) Signed: 09/06/2023 4:45:44 PM By: Allen Derry PA-C Signed: 09/07/2023 5:52:17 PM By: Redmond Pulling RN, BSN Entered By: Redmond Pulling on 09/06/2023 14:58:16 -------------------------------------------------------------------------------- Problem List Details Patient Name: Date of Service: [name], [name]. 09/06/2023 2:00 PM Medical Record Number: [id number] Patient Account Number: [id number] Date of Birth/Sex: Treating RN: 1961-10-26 (61 y.o. F) Primary Care Provider: [name] Other Clinician: Referring Provider: Treating Provider/Extender: [name], [name] Weeks in Treatment: [name] [place] Price ([id number]) [id number] Page 7 of  13 Active Problems ICD-10 Encounter Code Description Active Date MDM Diagnosis 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 2 diabetes mellitus with foot ulcer 04/19/2023 No Yes L97.512 Non-pressure chronic ulcer of other part of right foot with fat layer exposed 04/19/2023 No Yes L97.522 Non-pressure chronic ulcer of other part of left foot with fat layer exposed 04/19/2023 No Yes E11.622 Type 2 diabetes mellitus with other skin ulcer 04/19/2023 No Yes L97.812 Non-pressure chronic ulcer of other part of right lower leg with fat layer 04/19/2023 No Yes exposed I73.89 Other specified peripheral vascular diseases 04/19/2023 No Yes I87.331 Chronic venous hypertension (idiopathic) with ulcer and inflammation of right 04/19/2023 No Yes lower extremity Inactive Problems Resolved Problems Electronic Signature(Price) Signed: 09/06/2023 2:40:58 PM By: Allen Derry PA-C Entered By: Allen Derry on 09/06/2023 14:40:58 -------------------------------------------------------------------------------- Progress  Note Details Patient Name: Date of Service: [name] 09/06/2023 2:00 PM Medical Record Number: [id number] Patient Account Number: [id number] Date of Birth/Sex: Treating RN: July 31, 1961 (63 y.o. F) Primary Care Provider: [name] Other Clinician: Referring Provider: Treating Provider/Extender: [name], [name] Weeks in Treatment: 20 Subjective Chief Complaint Information obtained from Patient Bilateral LE Ulcers History of Present Illness (HPI) This 61 year old patient who has a very long significant history of diabetes mellitus, previous alcohol and nicotine abuse, chronic data disease, COPD, diabetes mellitus, height hypertension, critical lower limb ischemia with several wounds being managed at the wound Center at [facility] for over a year. She was recently in the ER at [facility] and was referred to our center. He has had a long history of critical limb ischemia and over a period of time has  had balloon angioplasties in March 2016, endarterectomy of the left carotid, lower extremity angiogram and treatment by Dr. Nanetta Batty, several cardiac catheterizations. Most recently she had an x-ray of her left foot while in the ER which showed no acute abnormality. during this ER visit she was started on ciprofloxacin and asked to continue with the wound care physicians at [facility]. Her last ABI done in June 2017 showed the right side to be 0.28 in the left side to be 0.48. Her right toe brachial index was 0.17 on the right and 0.27 on the [place] ([id number]) [phone].pdf Page 8 of 13 left. her last hemoglobin A1c was 11.1% She continues to smoke about a pack of cigarettes a day. 03/28/2017 -- -- right foot x-ray -- IMPRESSION:Areas of soft tissue swelling. Mild subluxation second PIP joint. No frank dislocation or fracture. No erosive change or bony destruction. No soft tissue ulceration or radiopaque foreign body evident by radiography. There is plantar fascia calcification with a nearby inferior calcaneal spur. There are foci of arterial vascular calcification. 04/04/2017 -- the patient continues to be noncompliant and continues to complain of a lot of pain and has not done anything about quitting smoking Readmission: 06/26/18 on evaluation today patient presents for reevaluation she has not been seen in our office since May 2018. Since she was last seen here in our office she has undergone testing at Dr. Durenda Age office where it was revealed that she had an ABI of 0.68 with her previous ABI being 0.64 and a left ABI of 0.38 with previous ABI being 0.34 this study which was performed on 04/05/18 was pretty much equivalent to the study performed on 11/22/17. The findings in the end revealed on the right would appear to be moderate right lower extremity arterial disease on the left Dr. Allyson Sabal states moderate in the report but unfortunately this appears  to be much more severe at 0.38 compared to the right. Subsequently the patient was scheduled to have angiography with Dr. Allyson Sabal on 04/12/18. This was however canceled due to the fact that the patient was found to have chronic kidney disease stage III and it was to the point that he did not feel that it will be safe to pursue angiography at that point. She has not been on any antibiotics recently. At this point Dr. Allyson Sabal has not rescheduled anything as far as the injured [place] according to the patient he is somewhat reluctant to Karla so. Nonetheless with her diminished blood flow this is gonna make it somewhat difficult for her to heal. 07/05/18; this is a patient I have not seen previously. She has  very significant PAD as noted above. She apparently has had revascularization efforts by Dr. Allyson Sabal on the right on 3 occasions to the patient. She was supposed to have an attempted angiography on the left however this was canceled apparently because of stage III chronic renal failure. I will need to research all of this. She complains of significant pain in the wound and has claudication enough that when she walks to the end of the driveway she has to stop. She is been using silver alginate to the wounds on her legs and Iodoflex to the area on her left second toe. 07/13/18 on evaluation today patient'Price wounds actually appear to be doing about the same. She has an appointment she tells me within the next month that is September 2019 with Dr. Allyson Sabal to discuss options to see if there'Price anything else that you can recommend or Karla for her. Nonetheless obviously what we're trying to Karla is Karla what we can to save her leg and in turn prevent any additional worsening or damage. None in the meantime we been mainly trying to manage her ulcers as best we can. 07/27/18; some improvement in the multiplicity of wounds on her left lower extremity and foot. She'Price been using silver alginate. I was unable to determine that she  actually has an appointment with Dr. Allyson Sabal. We are checking into this The patient'Price wound includes Left lateral foot, left plantar heel, her left anterior calf wound looks close to me, left fourth toe is very close to closed and the left medial calf is perhaps the largest open area READMISSION 02/19/2021 This is a now 62 year old woman with type 2 diabetes and continued cigarette smoking. She has known PAD. She was last in this clinic in 2019 at that point with wounds on her left foot. She left in a nonhealed state. On 06/28/2019 I see she had a left femoropopliteal by vein and vascular with an amputation of the fifth ray. These managed to heal over. Her last arterial studies were in February 2021. This showed an absent waveform at the posterior tibial artery on the right a dampened monophasic dorsalis pedis on the right of 0.44. On the left again the PTA TA was absent her dorsalis pedis was 0.99 triphasic and her great toe pressure was 0.65. This would have been after her revascularization. Once again she tells me that Dr. Allyson Sabal has done revascularization on the right leg on 3 different occasions. She has a right transmetatarsal amputation apparently done by Dr. Lajoyce Corners remotely but I Karla not see a note for these right lower extremity revascularization but I may not of looked back far enough. In any case she says that the she has a wound on the plantar aspect of the right transmetatarsal amputation site there is been there for several months. More recently she fell and had a wound on her right medial lower leg she had 5 sutures placed in the ER and then subsequently she has developed an area on the medial lower leg which was a blister that opened up. Past medical history includes type 2 diabetes, PAD, chronic renal failure, congestive heart failure, right transmetatarsal amputation, left fifth ray amputation, continued tobacco abuse, atrial fibrillation and cirrhosis. We did not attempt an ABI on the  right leg today because of pain 02/26/2021; patient we admitted to the clinic last week. She has what looks to be 2 areas on her right medial and right anterior lower leg that look more like venous wounds but she has  1 on the first met head at the base of her right transmet. She has known severe PAD. She complains of a lot of pain although some of this may be neuropathic. Is difficult to exclude a component of claudication. Use silver alginate on the wounds on her legs and Iodoflex on the area on the first met head. She has an appointment with Dr. Allyson Sabal on 5/25 although I will text him and discussed the situation. She is have poorly controlled diabetic. She has has stage IIIb chronic renal failure 4/22; patient presents for 1 week follow-up. The 2 areas on her right medial and right anterior lower leg appear well-healing. She has been using silver alginate to this area without issues. She has a first met head ulcer and it is unclear how long this has been there as it was discovered in the ED earlier this month when she was being evaluated for another issue. Iodoflex has been used at this area. 4/29; patient presents for 1 week follow-up. She has been using silver alginate to the leg wounds and Iodoflex to the plantar foot wound. She has had this wrapped with Coban and Kerlix. She has no concerns or complaints today. 5/6; this is a difficult wound on the right plantar foot transmit site. We are not making a lot of progress. She had 2 more venous looking wounds on the right medial and right anterior lower leg 1 of which is healed. Her appointment with Dr. Allyson Sabal is on 5/25 5/16; she did not tolerate the offloading shoe we gave her in fact she had a fall with an abrasion on her right forearm she is now back in the modified small shoe which does not offload her foot properly. Her appoint with Dr. Allyson Sabal is still on 5/25 we have been using Iodoflex. The area on the right anterior lower leg is healed 7/18;  patient presents for follow-up however has not been seen in 2 months. She was last seen at the end of May. She had a fall at the end of June and was hospitalized. She has been unable to follow-up since then. For the wound she has only been keeping it covered with gauze. She is scheduled for an aortogram with lower extremity runoff at the end of July. 7/29; patient presents for 2-week follow-up. She has home health and they have been changing her dressings. She denies any issues and has no complaints today. She states she had a procedure where they opened up one of her vessels in her legs. She denies signs of infection. 8/5; patient presents for follow-up. She was recently in the hospital for bradycardia. She was noted to have cellulitis to the left lower extremity and Unna boot was placed due to blisters and increased swelling. She reports improvement to her left leg since being in the hospital and stability check her right plantar foot wound. She denies signs of infection. 8/23; since I last saw this patient a lot has happened. She still has the area over the right first met head in the setting of previous transmetatarsal amputation. Firstly most importantly she underwent revascularization of her occluded right SFA. She underwent directional atherectomy followed by drug-coated balloon angioplasty. She has no named vessel below the knee hopefully the revascularization of the right SFA will improve collateral flow. It is not felt however that she has any endovascular options. She had follow-up arterial studies noninvasive on 8/9. These showed an ABI of 0.44 at the right PTA. Monophasic waveforms. On the left her  great toe ABI was 0.68. Her follow-up arterial Doppler showed a 50 to 74% stenosis in the proximal SFA and a 50 to 74% stenosis in the distal SFA mild to severe atherosclerosis noted throughout the extremity. Areas of shadowing plaque seen, unable to rule out higher grade stenosis she also had a  fall apparently wearing a right foot forefoot off loader that we gave her. She therefore comes in in an ordinary running shoe today. Not certain if this is the fall that ended up in the hospital with [name], [name] ([id number]) [id number] Page 9 of 13 bradycardia 9/6 area over the right first metatarsal head in the setting of her previous amputation and severe PAD. She is also a diabetic with known PAD status post attempt at revascularization. She is continued smoker Again the separation of visits in the clinic is somewhat disturbing if we are going to consider her for a total contact cast. She is not wearing anything to offload this stating it causes imbalance especially the forefoot off loader. She basically comes in in bedroom slippers She also had a fall this morning about an hour ago. She has a skin tear on the left dorsal forearm 9/13; areas over the right first metatarsal head in the setting of previous TMA and severe PAD. Again she comes in here in slippers. We have been using Hydrofera Blue. We use MolecuLight on this which was essentially negative study 9/20; right first metatarsal head in the setting of her previous TMA and severe PAD. Same slipper type shoes. She says she cannot wear a forefoot off loader and for some reason she will not wear surgical shoes. She says she is smoking half a pack per day. 9/30; right first metatarsal head. Absolutely no improvement in fact there is tunneling superiorly very close to bone. She does not offload this properly at all. We use MolecuLight on this 2 weeks ago that did not show any surface bacteria we have been using silver collagen is a dressing She tells me she is down to 3 cigarettes a day I have offered encouragement and a treatment plan 10/6; right first metatarsal head. Exposed bone this week which is not surprising. We have been using silver collagen. She is smoking 5 to 10 cigarettes a day 10/17; she has not been  here in 10 days. The bone scraping that I did on 10/6 showed staph lugdunensis, staph epidermidis and Pseudomonas Alcalifenes. I put in for Septra DS 1 p.o. twice daily for 14 days last week would be but we could not reach her to start the antibiotic. Quinolones would have been a good alternative except she has multiple drug interactions. Septra would not cover what ever the Pseudomonas is but I am not really sure of the relevance here. I am going to try her on the Septra for 2 weeks. She has a probing wound on the right TMA that probes to bone. She claims to have just about stop smoking I reviewed her arterial status. She underwent a left fem-tib bypass by Dr. Arbie Cookey in 06/28/2019. Her last angiogram was in July of this year by Dr. Allyson Sabal. This showed an occluded right SFA the popliteal and tibial vessels were also occluded the peroneal vessel filled by collaterals and filled the PT and DP by communicating collaterals. She was not felt to have any additional and vascular options. Dr. Allyson Sabal noted that she he had previously revascularized her right SFA 3 times. 10/25; she is tolerating the Septra but says it makes her  nauseated. I am going to continue this for an additional 2 weeks perhaps for a total of 6 weeks. Her wound does not look too much different. There is on the right first met metatarsal head. There is probing areas around the tissue that is in the middle of the wound. There is no purulence I cannot feel bone. The original source was for a bone scraping. 11/8; right first metatarsal head. This does not probe to bone and there is no purulence. As far as I can tell she is completed the Septra although there were problems with exact length of time and I think compliance. In any case I gave her 6 weeks. I have reviewed her PAD above. 11/15; right first metatarsal head. Again protruding subcutaneous tissue but this does not have any adherence to surrounding skin. Undermining circumferentially. There  is no palpable bone. Not grossly purulent. We have been using Iodoflex to try to get some adherence to clean off the surface but no real improvement. I Karla not think they actually had enough supplies listening to the patient. She completed the 6 weeks of Septra I gave her, but I am not sure about the compliance with the dates i.e. may have missed days taken 1 a day etc. The patient has a vascular follow-up with Dr. Allyson Sabal this coming Friday i.e. in 3 days. By my read of arterial evaluations I Karla not think she is felt to be a candidate for any further revascularization. The last angiogram was in July. 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 She continues to smoke now up to 10 cigarettes a day. She is not in any pain. Wound has not improved but is certainly not worsened. I gave her 6 weeks of trimethoprim/sulfamethoxazole which she is completed in some fashion. 11/29 right first metatarsal head previous TMA. Wound actually looks somewhat better today still a lot of callus around the wound on debris on the wound surface. UNFORTUNATELY the patient missed her appointment with Dr. Allyson Sabal and is not rebooked apparently until sometime in February 12/13; right first metatarsal wound actually looks some smaller today. I did not debride this today. She is using Iodoflex.  When she came in last week she had a large abscess on her left anterior lower leg apparently after falling we send her to the ER to have this drained which was done. I cannot see any cultures x-ray was negative. She was apparently given 2 doses of IV antibiotics and discharged on Bactrim DS twice daily which she is still taking. As mentioned I cannot see any cultures. 12/29; culture of the abscess on the left leg I did last time showed Serratia. We gave her antibiotics. I note she was in the ER next day with bleeding which they controlled she is on anticoagulants.She comes in today to clinic and the area on her right plantar  foot is miraculously very hyperkeratotic but healed 02/06/2023 Ms. 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 She reports a callus to the right first met head and on the lateral aspect of the left foot. She saw podiatry 3 days ago and had the callus debrided. There are no open wounds at this time. She follows up with podiatry in 2 weeks. Readmission: 04-19-2023 upon evaluation today patient appears to be doing well all things considered in regard to her wounds. She does have several  wounds however on her feet 1 on the left foot centrally along the lateral edge and then a wound on the right lower leg more posterior. On the right foot plantar distal at the transmetatarsal amputation site this is the worst. With that being said she tells me this has been going on for quite some time she has been seen by podiatry they have done some debridements in the past. With that being said based on what we are seeing I believe that the patient'Price primary issue on the right side is that she is having some issues here with her arterial flow. The ABI on the right is 0.57 on the left is 1.04. With that being said this reading on the right is getting necessitated further evaluation by vascular. She tells me she has been seen by Dr. Gery Pray in the past and he stated there was nothing further to be done for the right leg. With that being said I think that it would be good to have a second opinion with vascular on this. Patient does have a history of diabetes mellitus type 2, peripheral vascular disease, and hypertension. The previous ABIs were performed on January 11, 2023. 04-26-2023 upon evaluation today patient appears to be doing about the same in regard to her wounds. She still has not had the arterial study at this point. I think this is something needs to be done as quickly as possible. Fortunately I Karla  not see any evidence of active infection locally or systemically which is great news. No fevers, chills, nausea, vomiting, or diarrhea. 7/24; the patient has not been here in about a month and a half. She has open wounds on the plantar right foot second metatarsal head in the setting of a previous TMA, she has a new area on the left lateral foot and perhaps traumatic areas on the right anterior and left lateral lower legs. She has poorly controlled edema. The patient has known severe PAD has had multiple interventions in the right SFA. 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 She had an pressure of 60 at the dorsalis pedis giving her an ABI of 0.57 monophasic waveforms here. On the left her study was a lot better with an ABI 1.04 triphasic waveforms. The ABI might be falsely elevated on the left. I think at her last angiogram she had occluded right SFA popliteal and tibial vessels with the peroneal vessel being the only vessel feeding her foot via collaterals. She comes in this time in basic over-the-counter shoes 06-14-2023 upon evaluation today patient appears to be doing well currently in regard to her wounds all things considered except for the left lateral foot where she has what appears to be a blister this is erythematous as well and concerned about infection. I discussed with her today that I Karla think we need to see [name], [name] ([id number]) [id number] Page 10 of 13 about doing the Vi core culture and subsequently depending on the results of this we will initiate antibiotic treatment as necessary. With that being said right now I think that she is going to need to be aggressive and elevating her legs she has an appointment with vascular coming up around 3 August is what I believe her appointment date is. Nonetheless once we get clearance from the  standpoint of vascular she does have good blood flow and we can subsequently try to  see what we Karla about getting her edema under much better control and then she really needs some much stronger compression than what we currently are able to Karla. 06-21-2023 upon evaluation today patient appears to be doing pretty well currently in regard to her wounds most of which seem to be showing signs of improvement. She has been require little bit of debridement in regard to the right plantar foot wound I will Karla this as carefully as possible as we are still working on establishing that she has sufficient blood flow at this point. This right side on previous testing seem to be somewhat questionable. Fortunately I Karla not see any signs of active infection locally nor systemically at this time. 06-28-2023 patient does have an appointment with vascular on 07-11-2023. With that being said right now she has been having some slow progress but still nonetheless good progress at this point. I am actually very pleased with where we stand. I Karla not see any signs of active infection locally or systemically which is great news. No fevers, chills, nausea, vomiting, or diarrhea. 07-05-2023 upon evaluation today patient appears to be doing well currently in regard to her wound. She has been tolerating the dressing changes. She only has 1 wound left on the plantar aspect of the right foot. With that being said she does have a peg assist offloading shoe she has been using at this time. 8/28; patient with a wound over the plantar right foot metatarsal head. She was seen by Dr. Lenell Antu of vascular surgery. Noted that the right ABI was only 0.57 it was felt that she had progression versus a previous study. She had some stenosis in the SFA a 7599% stenosis in the proximal mid SFA mild stenosis in the distal SFA and mild stenosis in the proximal PTA. On the left she had a patent left femoral to ATA bypass graft with mild stenosis It was  recommended that she undergo angiography for limb salvage on the right. That is being arranged as we speak. Her wound is on the right first metatarsal head she has been using collagen as the primary dressing. She tells me she is making every effort to stop smoking 07-19-2023 upon evaluation today patient appears to be doing okay in regard to her foot ulcer although her arterial procedure/revascularization keeps getting pushed back. She states that they are talking about pushing back and left nail due to the fact that she changed insurances and I Karla not have a being "updated insurance card number". With that being said I explained to the patient that should be something that she should be able to get on line easily by making an account. She is also called [place] [facility] and they said they were sending it but it would be about a week before described by next Wednesday. She really does not have time to be holding on this she needs to be proceeding with intervention as quickly as possible. 07-26-2023 upon evaluation today patient appears to be doing well currently in regard to her wound. This is actually showing signs of looking about the same I did clear a lot of callus up last week this actually looks better to me from an overall visual standpoint than last week though it is a little larger because of the callus I had removed. With that being said I feel like she is actually making really good progress here towards complete closure. No fevers, chills, nausea, vomiting, or diarrhea. 08-09-2023 upon evaluation today patient presents for  follow-up concerning her ongoing issues with her right plantar foot ulcer. She is going to be having arteriogram on Friday that is just just 2 days and hopefully following this it will actually allow for more aggressive healing in regard to the right plantar foot. Today I am going to perform some debridement to clear away some of the necrotic debris. 08-23-2023 upon  evaluation patient'Price wound really appears to be doing about the same. She did have her arteriogram with Dr. Lenell Antu and that was on 9-27- 2024. Unfortunately it was noted that the patient had no good options for further revascularization. They plan to reevaluate there in a month but she is stated to be at "high risk for major amputation". 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 Subsequently this means that there is really not good blood flow into the ankle and foot which means that the patient really does not have a good chance of getting this to heal. 08-30-2023 upon evaluation today patient appears to be doing well currently in regard to her wound. She has been tolerating the dressing changes without complication and in general I Karla feel that the patient is doing quite well at this time. Unfortunately with the blood flow being so low and really no real vascular options to improve blood flow she really has to try to stay off of this is much as possible. 09-06-2023 upon evaluation today patient appears to be doing well currently in regard to the wound on her plantar foot it does not seem to be any worse is not terribly better but again there is really not much we can Karla from a blood flow standpoint at this time unfortunately. There does not appear to be any signs of active infection locally or systemically which is good news. She does have a wound on the posterior ankle location and the right leg as well. She also has an area on the left lateral foot. Objective Constitutional Obese and well-hydrated in no acute distress. Vitals Time Taken: 2:26 PM, Height: 64 in, Weight: 225 lbs, BMI: 38.6, Temperature: 97.8 F, Pulse: 80 bpm, Respiratory Rate: 18 breaths/min, Blood Pressure: 135/71 mmHg. Respiratory normal breathing without  difficulty. Psychiatric this patient is able to make decisions and demonstrates good insight into disease process. Alert and Oriented x 3. pleasant and cooperative. General Notes: Upon inspection patient'Price wound bed actually showed signs at all locations of doing quite well on the right. On the left this appears to be more of a blood blister where something must of rubbed it does not appear to be open but we will have to keep a close eye on things. Integumentary (Hair, Skin) Wound #23 status is Open. Original cause of wound was Gradually Appeared. The date acquired was: 04/18/2021. The wound has been in treatment 20 weeks. The wound is located on the Right Metatarsal head first. The wound measures 1.2cm length x 1cm width x 0.2cm depth; 0.942cm^2 area and 0.188cm^3 volume. There is Fat Layer (Subcutaneous Tissue) exposed. There is no tunneling or undermining noted. There is a medium amount of serosanguineous drainage noted. The wound margin is distinct with the outline attached to the wound base. There is large (67-100%) pink granulation within the wound bed. There is a small (1-33%) amount of necrotic tissue within the wound bed including Adherent Slough. The periwound skin appearance had no abnormalities noted  for color. The periwound skin appearance exhibited: Callus, Maceration. The periwound skin appearance did not exhibit: Dry/Scaly. Periwound temperature was noted as No Abnormality. Wound #29 status is Open. Original cause of wound was Bump. The date acquired was: 08/20/2023. The wound has been in treatment 2 weeks. The wound is [name], [name] ([id number]) [id number] Page 11 of 13 located on the Right,Anterior Lower Leg. The wound measures 1.5cm length x 1.6cm width x 0.2cm depth; 1.885cm^2 area and 0.377cm^3 volume. There is Fat Layer (Subcutaneous Tissue) exposed. There is no tunneling or undermining noted. There is a medium amount of serosanguineous drainage noted.  There is large (67-100%) red granulation within the wound bed. There is a small (1-33%) amount of necrotic tissue within the wound bed including Eschar and Adherent Slough. The periwound skin appearance did not exhibit: Callus, Crepitus, Excoriation, Induration, Rash, Scarring, Dry/Scaly, Maceration, Atrophie Blanche, Cyanosis, Ecchymosis, Hemosiderin Staining, Mottled, Pallor, Rubor, Erythema. Wound #30 status is Open. Original cause of wound was Gradually Appeared. The date acquired was: 09/06/2023. The wound is located on the Right,Distal,Posterior Lower Leg. The wound measures 0.5cm length x 0.3cm width x 0.1cm depth; 0.118cm^2 area and 0.012cm^3 volume. There is Fat Layer (Subcutaneous Tissue) exposed. There is no tunneling or undermining noted. There is a medium amount of serosanguineous drainage noted. There is no granulation within the wound bed. There is a large (67-100%) amount of necrotic tissue within the wound bed including Eschar and Adherent Slough. 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 Allen Derry PA Wednesday 09/13/23 @ 1230 Return Appointment in 2 weeks. Allen Derry PA Wednesday Return appointment in 3 weeks. Allen Derry PA Wednesday please schedule Return appointment in 1 month. Allen Derry PA Wednesday please schedule Anesthetic: (In clinic) Topical Lidocaine 5% applied to wound bed Bathing/ Shower/ Hygiene: May shower with protection but Karla not get wound  dressing(Price) wet. Protect dressing(Price) with water repellant cover (for example, large plastic bag) or a cast cover and may then take shower. Edema Control - Lymphedema / SCD / Other: Elevate legs to the level of the heart or above for 30 minutes daily and/or when sitting for 3-4 times a day throughout the day. Avoid standing for long periods of time. Exercise regularly - As tolerated Moisturize legs daily. Other Edema Control Orders/Instructions: - Recommend Compression stocking 15-26mm/Hg left leg. Off-Loading: Open toe surgical shoe to: - with Peg Assist in shoe. Limit walking on foot. Stay off the foot to minimize pressure to bottom of foot. Non Wound Condition: Apply the following to affected area as directed: - left foot- apply betadine to affected area cover with ABD pad, secure with kerlix Home Health: New wound care orders this week; continue Home Health for wound care. May utilize formulary equivalent dressing for wound treatment orders unless otherwise specified. - home health twice a week. Wound Center weekly. primary dressing iodoflex or iodosorb to all wounds. Other Home Health Orders/Instructions: - [facility] *****Physical Therapy- non-weight bearing with right foot. The following medication(Price) was prescribed: lidocaine topical 5 %  ointment ointment topical once daily was prescribed at facility WOUND #23: - Metatarsal head first Wound Laterality: Right Cleanser: Soap and Water 3 x Per Week/30 Days Discharge Instructions: May shower and wash wound with dial antibacterial soap and water prior to dressing change. Cleanser: Wound Cleanser 3 x Per Week/30 Days Discharge Instructions: Cleanse the wound with wound cleanser prior to applying a clean dressing using gauze sponges, not tissue or cotton balls. Peri-Wound Care: Sween Lotion (Moisturizing lotion) 3 x Per Week/30 Days Discharge Instructions: Apply moisturizing lotion as directed Prim Dressing: IODOFLEX 0.9% Cadexomer  Iodine Pad 4x6 cm 3 x Per Week/30 Days ary Discharge Instructions: *****OR IODOSORB****Apply to wound bed as instructed Secondary Dressing: ABD Pad, 8x10 3 x Per Week/30 Days Discharge Instructions: Apply over primary dressing as directed. Secured With: [employer], 4.5x3.1 (in/yd) 3 x Per Week/30 Days Discharge Instructions: Secure with Kerlix as directed. Secured With: 71M Medipore H Soft Cloth Surgical T ape, 4 x 10 (in/yd) 3 x Per Week/30 Days Discharge Instructions: Secure with tape as directed. Com pression Wrap: tubigrip size E single layer 3 x Per Week/30 Days Discharge Instructions: apply in the morning and remove at night. WOUND #29: - Lower Leg Wound Laterality: Right, Anterior Cleanser: Soap and Water 3 x Per Week/30 Days Discharge Instructions: May shower and wash wound with dial antibacterial soap and water prior to dressing change. Cleanser: Wound Cleanser 3 x Per Week/30 Days Discharge Instructions: Cleanse the wound with wound cleanser prior to applying a clean dressing using gauze sponges, not tissue or cotton balls. Peri-Wound Care: Sween Lotion (Moisturizing lotion) 3 x Per Week/30 Days Discharge Instructions: Apply moisturizing lotion as directed Prim Dressing: IODOFLEX 0.9% Cadexomer Iodine Pad 4x6 cm 3 x Per Week/30 Days ary Discharge Instructions: *****OR IODOSORB****Apply to wound bed as instructed Secondary Dressing: ABD Pad, 8x10 3 x Per Week/30 Days Discharge Instructions: Apply over primary dressing as directed. Secured With: [employer], 4.5x3.1 (in/yd) 3 x Per Week/30 Days Discharge Instructions: Secure with Kerlix as directed. [name], [name] ([id number]) [phone].pdf Page 12 of 13 Secured With: 71M Medipore H Soft Cloth Surgical T ape, 4 x 10 (in/yd) 3 x Per Week/30 Days Discharge Instructions: Secure with tape as directed. Com pression Wrap: tubigrip size E single layer 3 x Per Week/30 Days Discharge Instructions:  apply in the morning and remove at night. WOUND #30: - Lower Leg Wound Laterality: Right, Posterior, Distal Cleanser: Soap and Water 3 x Per Week/30 Days Discharge Instructions: May shower and wash wound with dial antibacterial soap and water prior to dressing change. Cleanser: Wound Cleanser 3 x Per Week/30 Days Discharge Instructions: Cleanse the wound with wound cleanser prior to applying a clean dressing using gauze sponges, not tissue or cotton balls. Peri-Wound Care: Sween Lotion (Moisturizing lotion) 3 x Per Week/30 Days Discharge Instructions: Apply moisturizing lotion as directed Prim Dressing: IODOFLEX 0.9% Cadexomer Iodine Pad 4x6 cm 3 x Per Week/30 Days ary Discharge Instructions: *****OR IODOSORB****Apply to wound bed as instructed Secondary Dressing: ABD Pad, 8x10 3 x Per Week/30 Days Discharge Instructions: Apply over primary dressing as directed. Secured With: [employer], 4.5x3.1 (in/yd) 3 x Per Week/30 Days Discharge Instructions: Secure with Kerlix as directed. Secured With: 71M Medipore H Soft Cloth Surgical T ape, 4 x 10 (in/yd) 3 x Per Week/30 Days Discharge Instructions: Secure with tape as directed. Com pression Wrap: tubigrip size E single layer 3 x Per Week/30 Days Discharge Instructions: apply in the morning and remove at night.  1. I would recommend that we have the patient continue to monitor for any signs of infection or worsening. Based on what I see I Karla believe that we are making good headway towards closure which is good news. 2. I am going to recommend that we have the patient continue to utilize the Iodoflex on the right leg wounds on the left foot we will can use Betadine followed by dry gauze and ABD pad for padding. 3. I am going to suggest she should continue to try to stay off of her feet is much as possible considering that there is no way for improved arterial flow the best thing that we can hope for is that with the flow she has she is able to  heal eventually but time will tell. We will see patient back for reevaluation in 1 week here in the clinic. If anything worsens or changes patient will contact our office for additional recommendations. Electronic Signature(Price) Signed: 09/08/2023 2:17:02 PM By: Shawn Stall RN, BSN Signed: 09/11/2023 10:49:52 AM By: Allen Derry PA-C Previous Signature: 09/06/2023 2:57:01 PM Version By: Allen Derry PA-C Entered By: Shawn Stall on 09/08/2023 13:04:58 -------------------------------------------------------------------------------- SuperBill Details Patient Name: Date of Service: [name], [name]. 09/06/2023 Medical Record Number: [id number] Patient Account Number: [id number] Date of Birth/Sex: Treating RN: July 27, 1961 (61 y.o. 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 with fat layer exposed E11.622 Type 2 diabetes mellitus with other skin ulcer L97.812 Non-pressure chronic ulcer of other part of right lower leg with fat layer exposed I73.89 Other specified peripheral vascular diseases I87.331 Chronic venous hypertension (idiopathic) with ulcer and inflammation of right lower extremity Facility Procedures : CPT4 Code: 08657846 Description: 99213 - WOUND CARE VISIT-LEV 3 EST PT Modifier: Quantity: 1 Physician Procedures : CPT4 Code Description Modifier [id number] 99213 - WC PHYS LEVEL 3 - EST PT [name], [name] ([id number]) [id number] Page ICD-10 Diagnosis Description 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.512 Non-pressure chronic  ulcer of other part of right foot with fat layer exposed  L97.522 Non-pressure chronic ulcer of other part of left foot with fat layer exposed E11.622 Type 2 diabetes mellitus with other skin ulcer Quantity: 1 13 of 13 Electronic Signature(Price) Signed: 09/18/2023 11:57:59 AM By: Pearletha Alfred Signed: 09/19/2023 8:43:52 AM By: Allen Derry PA-C Previous Signature: 09/13/2023 2:23:54 PM Version By: Pearletha Alfred Previous Signature: 09/13/2023 4:33:53 PM Version By: Allen Derry PA-C Previous Signature: 09/06/2023 2:57:19 PM Version By: Allen Derry PA-C Entered By: Pearletha Alfred on 09/18/2023 11:57:59

## 2023-09-08 NOTE — Progress Notes (Signed)
Measurements Length: (cm) 0.5 Width: (cm) 0.3 Depth: (cm) 0.1 Area: (cm) 0.118 Volume: (cm) 0.012 % Reduction in Area: % Reduction in Volume: Epithelialization: None Tunneling: No Undermining: No Wound Description Classification: Full Thickness Without Exposed Suppor Exudate Amount: Medium Exudate Type: Serosanguineous Exudate Color: red, brown t Structures Foul Odor After Cleansing: No Slough/Fibrino Yes Wound Bed Granulation Amount: None Present (0%) Exposed Structure Necrotic Amount: Large (67-100%) Fascia Exposed: No Necrotic Quality: Eschar, Adherent Slough Fat Layer (Subcutaneous Tissue) Exposed: Yes Tendon Exposed: No Muscle Exposed: No Joint Exposed: No Bone Exposed: No Periwound Skin Texture Texture Color No Abnormalities Noted: No No Abnormalities Noted: No Moisture No Abnormalities Noted: No Treatment Notes Wound #30 (Lower Leg) Wound Laterality: Right, Posterior, Distal Cleanser Soap and Water Discharge Instruction: May shower and wash wound with dial antibacterial soap and water prior to dressing change. Wound Cleanser Discharge Instruction: Cleanse the wound with wound cleanser prior to applying a clean dressing using gauze sponges, not tissue or cotton balls. Peri-Wound Care Sween Lotion (Moisturizing lotion) Discharge Instruction: Apply moisturizing lotion as directed Topical Primary Dressing IODOFLEX 0.9% Cadexomer Iodine Pad 4x6 cm Discharge Instruction: *****OR IODOSORB****Apply to wound bed  as instructed Secondary Dressing ABD Pad, 8x10 Discharge Instruction: Apply over primary dressing as directed. Secured With [employer], 4.5x3.1 (in/yd) Discharge Instruction: Secure with Kerlix as directed. 29M Medipore H Soft Cloth Surgical T ape, 4 x 10 (in/yd) Discharge Instruction: Secure with tape as directed. Compression Wrap tubigrip size E single layer Discharge Instruction: apply in the morning and remove at night. Compression Stockings Add-Ons Electronic Signature(s) Signed: 09/07/2023 5:52:17 PM By: Redmond Pulling RN, BSN Entered By: Redmond Pulling on 09/06/2023 11:34:54 [name] ([id number]) [id number] Page 9 of 9 -------------------------------------------------------------------------------- Vitals Details Patient Name: Date of Service: [name]. 09/06/2023 2:00 PM Medical Record Number: [id number] Patient Account Number: [id number] Date of Birth/Sex: Treating RN: 1961-05-23 (62 y.o. F) Primary Care Alycea Segoviano: [name] Other Clinician: Referring Yehya Brendle: Treating Mert Dietrick/Extender: [name], [name] Weeks in Treatment: 20 Vital Signs Time Taken: 14:26 Temperature (F): 97.8 Height (in): 64 Pulse (bpm): 80 Weight (lbs): 225 Respiratory Rate (breaths/min): 18 Body Mass Index (BMI): 38.6 Blood Pressure (mmHg): 135/71 Reference Range: 80 - 120 mg / dl Electronic Signature(s) Signed: 09/07/2023 4:59:00 PM By: Thayer Dallas Entered By: Thayer Dallas on 09/06/2023 11:26:20  Measurements Length: (cm) 0.5 Width: (cm) 0.3 Depth: (cm) 0.1 Area: (cm) 0.118 Volume: (cm) 0.012 % Reduction in Area: % Reduction in Volume: Epithelialization: None Tunneling: No Undermining: No Wound Description Classification: Full Thickness Without Exposed Suppor Exudate Amount: Medium Exudate Type: Serosanguineous Exudate Color: red, brown t Structures Foul Odor After Cleansing: No Slough/Fibrino Yes Wound Bed Granulation Amount: None Present (0%) Exposed Structure Necrotic Amount: Large (67-100%) Fascia Exposed: No Necrotic Quality: Eschar, Adherent Slough Fat Layer (Subcutaneous Tissue) Exposed: Yes Tendon Exposed: No Muscle Exposed: No Joint Exposed: No Bone Exposed: No Periwound Skin Texture Texture Color No Abnormalities Noted: No No Abnormalities Noted: No Moisture No Abnormalities Noted: No Treatment Notes Wound #30 (Lower Leg) Wound Laterality: Right, Posterior, Distal Cleanser Soap and Water Discharge Instruction: May shower and wash wound with dial antibacterial soap and water prior to dressing change. Wound Cleanser Discharge Instruction: Cleanse the wound with wound cleanser prior to applying a clean dressing using gauze sponges, not tissue or cotton balls. Peri-Wound Care Sween Lotion (Moisturizing lotion) Discharge Instruction: Apply moisturizing lotion as directed Topical Primary Dressing IODOFLEX 0.9% Cadexomer Iodine Pad 4x6 cm Discharge Instruction: *****OR IODOSORB****Apply to wound bed  as instructed Secondary Dressing ABD Pad, 8x10 Discharge Instruction: Apply over primary dressing as directed. Secured With [employer], 4.5x3.1 (in/yd) Discharge Instruction: Secure with Kerlix as directed. 29M Medipore H Soft Cloth Surgical T ape, 4 x 10 (in/yd) Discharge Instruction: Secure with tape as directed. Compression Wrap tubigrip size E single layer Discharge Instruction: apply in the morning and remove at night. Compression Stockings Add-Ons Electronic Signature(s) Signed: 09/07/2023 5:52:17 PM By: Redmond Pulling RN, BSN Entered By: Redmond Pulling on 09/06/2023 11:34:54 [name] ([id number]) [id number] Page 9 of 9 -------------------------------------------------------------------------------- Vitals Details Patient Name: Date of Service: [name]. 09/06/2023 2:00 PM Medical Record Number: [id number] Patient Account Number: [id number] Date of Birth/Sex: Treating RN: 1961-05-23 (62 y.o. F) Primary Care Alycea Segoviano: [name] Other Clinician: Referring Yehya Brendle: Treating Mert Dietrick/Extender: [name], [name] Weeks in Treatment: 20 Vital Signs Time Taken: 14:26 Temperature (F): 97.8 Height (in): 64 Pulse (bpm): 80 Weight (lbs): 225 Respiratory Rate (breaths/min): 18 Body Mass Index (BMI): 38.6 Blood Pressure (mmHg): 135/71 Reference Range: 80 - 120 mg / dl Electronic Signature(s) Signed: 09/07/2023 4:59:00 PM By: Thayer Dallas Entered By: Thayer Dallas on 09/06/2023 11:26:20  JOLENE, KURAS ([id number]) [phone].pdf Page 1 of 9 Visit Report for 09/06/2023 Arrival Information Details Patient Name: Date of Service: Maryla Morrow 09/06/2023 2:00 PM Medical Record Number: [id number] Patient Account Number: [id number] Date of Birth/Sex: Treating RN: 07-09-1961 (62 y.o. F) Primary Care March Steyer: [name] Other Clinician: Referring Majorie Santee: Treating Paton Crum/Extender: [name], [name] Weeks in Treatment: 20 Visit Information History Since Last Visit Added or deleted any medications: No Patient Arrived: Walker Any new allergies or adverse reactions: No Arrival Time: 14:24 Had a fall or experienced change in No Accompanied By: self activities of daily living that may affect Transfer Assistance: None risk of falls: Patient Requires Transmission-Based Precautions: No Signs or symptoms of abuse/neglect since last visito No Patient Has Alerts: Yes Hospitalized since last visit: No Patient Alerts: ABI R 0.57 (01/11/23) Implantable device outside of the clinic excluding No ABI L 1.04 (01/11/23) cellular tissue based products placed in the center since last visit: Has Dressing in Place as Prescribed: Yes Pain Present Now: Yes Electronic Signature(s) Signed: 09/07/2023 4:59:00 PM By: Thayer Dallas Entered By: Thayer Dallas on 09/06/2023 11:25:11 -------------------------------------------------------------------------------- Encounter Discharge Information Details Patient Name: Date of Service: [name], [name] 09/06/2023 2:00 PM Medical Record Number: [id number] Patient Account Number: [id number] Date of Birth/Sex: Treating RN: [birth year] (61 y.o. [name] Primary Care Alyona Romack: [name] Other Clinician: Referring Rhiannan Kievit: Treating Yichen Gilardi/Extender: [name], [name] Weeks in Treatment: 20 Encounter Discharge Information Items Discharge Condition: Stable Ambulatory Status:  Wheelchair Discharge Destination: Home Transportation: Private Auto Accompanied By: self Schedule Follow-up Appointment: Yes Clinical Summary of Care: Patient Declined Electronic Signature(s) Signed: 09/07/2023 5:52:17 PM By: Redmond Pulling RN, BSN Entered By: Redmond Pulling on 09/06/2023 13:59:25 [name] ([id number]) [id number] Page 2 of 9 -------------------------------------------------------------------------------- Lower Extremity Assessment Details Patient Name: Date of Service: Maryla Morrow. 09/06/2023 2:00 PM Medical Record Number: [id number] Patient Account Number: [id number] Date of Birth/Sex: Treating RN: 1961-09-02 (62 y.o. F) Primary Care Lynnell Fiumara: [name] Other Clinician: Referring Anvay Tennis: Treating Mersadies Petree/Extender: [name], [name] Weeks in Treatment: 20 Edema Assessment Assessed: [Left: No] [Right: No] Edema: [Left: Ye] [Right: s] Calf Left: Right: Point of Measurement: 30 cm From Medial Instep 45.5 cm 45.8 cm Ankle Left: Right: Point of Measurement: 9 cm From Medial Instep 24.3 cm 21 cm Vascular Assessment Pulses: Dorsalis Pedis Palpable: [Left:Yes] [Right:Yes] Extremity colors, hair growth, and conditions: Extremity Color: [Right:Hyperpigmented] Hair Growth on Extremity: [Right:No] Temperature of Extremity: [Right:Warm] Capillary Refill: [Right:< 3 seconds] Dependent Rubor: [Right:No Yes] Electronic Signature(s) Signed: 09/07/2023 4:59:00 PM By: Thayer Dallas Entered By: Thayer Dallas on 09/06/2023 11:31:18 -------------------------------------------------------------------------------- Multi-Disciplinary Care Plan Details Patient Name: Date of Service: [name], [name] 09/06/2023 2:00 PM Medical Record Number: [id number] Patient Account Number: [id number] Date of Birth/Sex: Treating RN: 1961/04/23 (61 y.o. [name] Primary Care Amberleigh Gerken: [name] Other  Clinician: Referring Calil Amor: Treating Ingris Pasquarella/Extender: [name], [name] Weeks in Treatment: 20 Active Inactive Wound/Skin Impairment Nursing Diagnoses: Impaired tissue integrity Goals: Patient/caregiver will verbalize understanding of skin care regimen Date Initiated: 04/19/2023 Target Resolution Date: 10/19/2023 KEMARIE, BIGGAR ([id number]) [phone].pdf Page 3 of 9 Goal Status: Active Interventions: Assess ulceration(s) every visit Treatment Activities: Skin care regimen initiated : 04/19/2023 Notes: Electronic Signature(s) Signed: 09/07/2023 5:52:17 PM By: Redmond Pulling RN, BSN Entered By: Redmond Pulling on 09/06/2023 11:37:50 -------------------------------------------------------------------------------- Pain Assessment Details Patient Name: Date of Service: HA LEY, [name] 09/06/2023 2:00 PM  JOLENE, KURAS ([id number]) [phone].pdf Page 1 of 9 Visit Report for 09/06/2023 Arrival Information Details Patient Name: Date of Service: Maryla Morrow 09/06/2023 2:00 PM Medical Record Number: [id number] Patient Account Number: [id number] Date of Birth/Sex: Treating RN: 07-09-1961 (62 y.o. F) Primary Care March Steyer: [name] Other Clinician: Referring Majorie Santee: Treating Paton Crum/Extender: [name], [name] Weeks in Treatment: 20 Visit Information History Since Last Visit Added or deleted any medications: No Patient Arrived: Walker Any new allergies or adverse reactions: No Arrival Time: 14:24 Had a fall or experienced change in No Accompanied By: self activities of daily living that may affect Transfer Assistance: None risk of falls: Patient Requires Transmission-Based Precautions: No Signs or symptoms of abuse/neglect since last visito No Patient Has Alerts: Yes Hospitalized since last visit: No Patient Alerts: ABI R 0.57 (01/11/23) Implantable device outside of the clinic excluding No ABI L 1.04 (01/11/23) cellular tissue based products placed in the center since last visit: Has Dressing in Place as Prescribed: Yes Pain Present Now: Yes Electronic Signature(s) Signed: 09/07/2023 4:59:00 PM By: Thayer Dallas Entered By: Thayer Dallas on 09/06/2023 11:25:11 -------------------------------------------------------------------------------- Encounter Discharge Information Details Patient Name: Date of Service: [name], [name] 09/06/2023 2:00 PM Medical Record Number: [id number] Patient Account Number: [id number] Date of Birth/Sex: Treating RN: [birth year] (61 y.o. [name] Primary Care Alyona Romack: [name] Other Clinician: Referring Rhiannan Kievit: Treating Yichen Gilardi/Extender: [name], [name] Weeks in Treatment: 20 Encounter Discharge Information Items Discharge Condition: Stable Ambulatory Status:  Wheelchair Discharge Destination: Home Transportation: Private Auto Accompanied By: self Schedule Follow-up Appointment: Yes Clinical Summary of Care: Patient Declined Electronic Signature(s) Signed: 09/07/2023 5:52:17 PM By: Redmond Pulling RN, BSN Entered By: Redmond Pulling on 09/06/2023 13:59:25 [name] ([id number]) [id number] Page 2 of 9 -------------------------------------------------------------------------------- Lower Extremity Assessment Details Patient Name: Date of Service: Maryla Morrow. 09/06/2023 2:00 PM Medical Record Number: [id number] Patient Account Number: [id number] Date of Birth/Sex: Treating RN: 1961-09-02 (62 y.o. F) Primary Care Lynnell Fiumara: [name] Other Clinician: Referring Anvay Tennis: Treating Mersadies Petree/Extender: [name], [name] Weeks in Treatment: 20 Edema Assessment Assessed: [Left: No] [Right: No] Edema: [Left: Ye] [Right: s] Calf Left: Right: Point of Measurement: 30 cm From Medial Instep 45.5 cm 45.8 cm Ankle Left: Right: Point of Measurement: 9 cm From Medial Instep 24.3 cm 21 cm Vascular Assessment Pulses: Dorsalis Pedis Palpable: [Left:Yes] [Right:Yes] Extremity colors, hair growth, and conditions: Extremity Color: [Right:Hyperpigmented] Hair Growth on Extremity: [Right:No] Temperature of Extremity: [Right:Warm] Capillary Refill: [Right:< 3 seconds] Dependent Rubor: [Right:No Yes] Electronic Signature(s) Signed: 09/07/2023 4:59:00 PM By: Thayer Dallas Entered By: Thayer Dallas on 09/06/2023 11:31:18 -------------------------------------------------------------------------------- Multi-Disciplinary Care Plan Details Patient Name: Date of Service: [name], [name] 09/06/2023 2:00 PM Medical Record Number: [id number] Patient Account Number: [id number] Date of Birth/Sex: Treating RN: 1961/04/23 (61 y.o. [name] Primary Care Amberleigh Gerken: [name] Other  Clinician: Referring Calil Amor: Treating Ingris Pasquarella/Extender: [name], [name] Weeks in Treatment: 20 Active Inactive Wound/Skin Impairment Nursing Diagnoses: Impaired tissue integrity Goals: Patient/caregiver will verbalize understanding of skin care regimen Date Initiated: 04/19/2023 Target Resolution Date: 10/19/2023 KEMARIE, BIGGAR ([id number]) [phone].pdf Page 3 of 9 Goal Status: Active Interventions: Assess ulceration(s) every visit Treatment Activities: Skin care regimen initiated : 04/19/2023 Notes: Electronic Signature(s) Signed: 09/07/2023 5:52:17 PM By: Redmond Pulling RN, BSN Entered By: Redmond Pulling on 09/06/2023 11:37:50 -------------------------------------------------------------------------------- Pain Assessment Details Patient Name: Date of Service: HA LEY, [name] 09/06/2023 2:00 PM

## 2023-09-11 ENCOUNTER — Telehealth: Payer: Self-pay | Admitting: Family Medicine

## 2023-09-11 ENCOUNTER — Other Ambulatory Visit: Payer: Self-pay

## 2023-09-11 NOTE — Progress Notes (Signed)
Surface(Price) Assessment (bed, cushion, seat, etc.) INTERVENTIONS - Wound Cleansing / Measurement []  - 0 Simple Wound Cleansing - one wound X- 2 5 Complex Wound Cleansing - multiple wounds X- 1 5 Wound Imaging (photographs - any number of wounds) []  - 0 Wound Tracing (instead of photographs) []  - 0 Simple Wound Measurement - one wound X- 2 5 Complex Wound Measurement - multiple wounds INTERVENTIONS - Wound Dressings X - Small Wound Dressing one or multiple wounds 1 10 X- 1 15 Medium Wound Dressing one or multiple wounds []  - 0 Large Wound Dressing one or multiple wounds []  - 0 Application of Medications - topical []  - 0 Application of Medications - injection INTERVENTIONS - Miscellaneous []  - 0 External ear exam []  - 0 Specimen Collection (cultures, biopsies, blood, body fluids, etc.) []  - 0 Specimen(Price) / Culture(Price) sent or taken to Lab for analysis []  - 0 Patient Transfer (multiple staff / Nurse, adult / Similar devices) []  - 0 Simple Staple / Suture removal (25 or less) []  - 0 Complex Staple / Suture removal (26 or more) []  - 0 Hypo / Hyperglycemic Management (close monitor of Blood Glucose) [name], [name] ([id number]) [id number] Page 3 of 8 []  - 0 Ankle / Brachial Index (ABI) - do not check if billed separately X- 1 5 Vital Signs Has the patient been seen at the hospital within the last three years: Yes Total Score: 110 Level Of Care: New/Established - Level 3 Electronic Signature(Price) Signed: 09/11/2023 4:05:37 PM By: Redmond Pulling RN, BSN Entered By: Redmond Pulling on 08/30/2023 15:16:20 -------------------------------------------------------------------------------- Encounter Discharge Information Details Patient Name: Date of Service: Karla Bigness, DO  [name]. 08/30/2023 1:15 PM Medical Record Number: [id number] Patient Account Number: [id number] Date of Birth/Sex: Treating RN: 06/24/1961 (61 y.o. [name] Primary Care Abdoulaye Drum: [name] Other Clinician: Referring Eleanor Dimichele: Treating Laylee Schooley/Extender: [name], [name] Weeks in Treatment: 29 Encounter Discharge Information Items Discharge Condition: Stable Ambulatory Status: Wheelchair Discharge Destination: Home Transportation: Private Auto Accompanied By: self Schedule Follow-up Appointment: Yes Clinical Summary of Care: Patient Declined Electronic Signature(Price) Signed: 09/11/2023 4:05:37 PM By: Redmond Pulling RN, BSN Entered By: Redmond Pulling on 08/30/2023 15:17:28 -------------------------------------------------------------------------------- Lower Extremity Assessment Details Patient Name: Date of Service: Karla Bigness, DO [name]. 08/30/2023 1:15 PM Medical Record Number: [id number] Patient Account Number: [id number] Date of Birth/Sex: Treating RN: 1961-04-29 (61 y.o. [name] Primary Care Laural Eiland: [name] Other Clinician: Referring Milina Pagett: Treating Dene Nazir/Extender: [name], [name] Weeks in Treatment: 19 Edema Assessment Assessed: [Left: No] [Right: No] Edema: [Left: Ye] [Right: Price] Calf Left: Right: Point of Measurement: 30 cm From Medial Instep 44.7 cm Ankle Left: Right: Point of Measurement: 9 cm From Medial Instep 22.3 cm Vascular Assessment Left: [[id number] Page 4 of 8Right:] Pulses: Dorsalis Pedis Palpable: [[id number].pdf Page 4 of 8No] Posterior Tibial Palpable: [phone].pdf Page 4 of 8Yes] Extremity colors, hair growth, and conditions: Extremity Color: [phone].pdf Page 4 of 8Hyperpigmented] Hair Growth on Extremity: [phone].pdf Page 4 of 8No] Temperature of  Extremity: [phone].pdf Page 4 of 8Warm] Capillary Refill: [phone].pdf Page 4 of 8< 3 seconds] Dependent Rubor: [phone].pdf Page 4 of 8No Yes] Electronic Signature(Price) Signed: 09/11/2023 4:05:37 PM By: Redmond Pulling RN, BSN Entered By: Redmond Pulling on 08/30/2023 13:39:09 -------------------------------------------------------------------------------- Multi-Disciplinary Care Plan Details Patient Name: Date of Service: Karla Bigness, DO [name]. 08/30/2023 1:15 PM Medical Record Number: [id number] Patient Account Number: [id number] Date of Birth/Sex: Treating RN: May 11, 1961 (61 y.o. [name] Primary Care Jibril Mcminn:  Surface(Price) Assessment (bed, cushion, seat, etc.) INTERVENTIONS - Wound Cleansing / Measurement []  - 0 Simple Wound Cleansing - one wound X- 2 5 Complex Wound Cleansing - multiple wounds X- 1 5 Wound Imaging (photographs - any number of wounds) []  - 0 Wound Tracing (instead of photographs) []  - 0 Simple Wound Measurement - one wound X- 2 5 Complex Wound Measurement - multiple wounds INTERVENTIONS - Wound Dressings X - Small Wound Dressing one or multiple wounds 1 10 X- 1 15 Medium Wound Dressing one or multiple wounds []  - 0 Large Wound Dressing one or multiple wounds []  - 0 Application of Medications - topical []  - 0 Application of Medications - injection INTERVENTIONS - Miscellaneous []  - 0 External ear exam []  - 0 Specimen Collection (cultures, biopsies, blood, body fluids, etc.) []  - 0 Specimen(Price) / Culture(Price) sent or taken to Lab for analysis []  - 0 Patient Transfer (multiple staff / Nurse, adult / Similar devices) []  - 0 Simple Staple / Suture removal (25 or less) []  - 0 Complex Staple / Suture removal (26 or more) []  - 0 Hypo / Hyperglycemic Management (close monitor of Blood Glucose) [name], [name] ([id number]) [id number] Page 3 of 8 []  - 0 Ankle / Brachial Index (ABI) - do not check if billed separately X- 1 5 Vital Signs Has the patient been seen at the hospital within the last three years: Yes Total Score: 110 Level Of Care: New/Established - Level 3 Electronic Signature(Price) Signed: 09/11/2023 4:05:37 PM By: Redmond Pulling RN, BSN Entered By: Redmond Pulling on 08/30/2023 15:16:20 -------------------------------------------------------------------------------- Encounter Discharge Information Details Patient Name: Date of Service: Karla Bigness, DO  [name]. 08/30/2023 1:15 PM Medical Record Number: [id number] Patient Account Number: [id number] Date of Birth/Sex: Treating RN: 06/24/1961 (61 y.o. [name] Primary Care Abdoulaye Drum: [name] Other Clinician: Referring Eleanor Dimichele: Treating Laylee Schooley/Extender: [name], [name] Weeks in Treatment: 29 Encounter Discharge Information Items Discharge Condition: Stable Ambulatory Status: Wheelchair Discharge Destination: Home Transportation: Private Auto Accompanied By: self Schedule Follow-up Appointment: Yes Clinical Summary of Care: Patient Declined Electronic Signature(Price) Signed: 09/11/2023 4:05:37 PM By: Redmond Pulling RN, BSN Entered By: Redmond Pulling on 08/30/2023 15:17:28 -------------------------------------------------------------------------------- Lower Extremity Assessment Details Patient Name: Date of Service: Karla Bigness, DO [name]. 08/30/2023 1:15 PM Medical Record Number: [id number] Patient Account Number: [id number] Date of Birth/Sex: Treating RN: 1961-04-29 (61 y.o. [name] Primary Care Laural Eiland: [name] Other Clinician: Referring Milina Pagett: Treating Dene Nazir/Extender: [name], [name] Weeks in Treatment: 19 Edema Assessment Assessed: [Left: No] [Right: No] Edema: [Left: Ye] [Right: Price] Calf Left: Right: Point of Measurement: 30 cm From Medial Instep 44.7 cm Ankle Left: Right: Point of Measurement: 9 cm From Medial Instep 22.3 cm Vascular Assessment Left: [[id number] Page 4 of 8Right:] Pulses: Dorsalis Pedis Palpable: [[id number] Page 4 of 8No] Posterior Tibial Palpable: [phone].pdf Page 4 of 8Yes] Extremity colors, hair growth, and conditions: Extremity Color: [phone].pdf Page 4 of 8Hyperpigmented] Hair Growth on Extremity: [phone].pdf Page 4 of 8No] Temperature of  Extremity: [phone].pdf Page 4 of 8Warm] Capillary Refill: [phone].pdf Page 4 of 8< 3 seconds] Dependent Rubor: [phone].pdf Page 4 of 8No Yes] Electronic Signature(Price) Signed: 09/11/2023 4:05:37 PM By: Redmond Pulling RN, BSN Entered By: Redmond Pulling on 08/30/2023 13:39:09 -------------------------------------------------------------------------------- Multi-Disciplinary Care Plan Details Patient Name: Date of Service: Karla Bigness, DO [name]. 08/30/2023 1:15 PM Medical Record Number: [id number] Patient Account Number: [id number] Date of Birth/Sex: Treating RN: May 11, 1961 (61 y.o. [name] Primary Care Jibril Mcminn:  [name] Other Clinician: Referring Jered Heiny: Treating Michiko Lineman/Extender: [name], [name] Weeks in Treatment: 42 Active Inactive Wound/Skin Impairment Nursing Diagnoses: Impaired tissue integrity Goals: Patient/caregiver will verbalize understanding of skin care regimen Date Initiated: 04/19/2023 Target Resolution Date: 10/19/2023 Goal Status: Active Interventions: Assess ulceration(Price) every visit Treatment Activities: Skin care regimen initiated : 04/19/2023 Notes: Electronic Signature(Price) Signed: 09/11/2023 4:05:37 PM By: Redmond Pulling RN, BSN Entered By: Redmond Pulling on 08/30/2023 13:50:07 -------------------------------------------------------------------------------- Pain Assessment Details Patient Name: Date of Service: Karla Bigness, DO [name]. 08/30/2023 1:15 PM Medical Record Number: [id number] Patient Account Number: [id number] Date of Birth/Sex: Treating RN: 03/01/1961 (61 y.o. F) Primary Care Jamaar Howes: [name] Other Clinician: [name] ([id number]) [id number] Page 5 of 8 Referring Grayden Burley: Treating Zosia Lucchese/Extender: [name], [name] Weeks in Treatment: 19 Active Problems Location of Pain Severity and  Description of Pain Patient Has Paino Yes Site Locations Pain Location: Generalized Pain Rate the pain. Current Pain Level: 3 Pain Management and Medication Current Pain Management: Electronic Signature(Price) Signed: 08/30/2023 4:39:33 PM By: Thayer Dallas Entered By: Thayer Dallas on 08/30/2023 13:34:08 -------------------------------------------------------------------------------- Patient/Caregiver Education Details Patient Name: Date of Service: [name] 10/16/2024andnbsp1:15 PM Medical Record Number: [id number] Patient Account Number: [id number] Date of Birth/Gender: Treating RN: January 15, 1961 (62 y.o. [name] Primary Care Physician: [name] Other Clinician: Referring Physician: Treating Physician/Extender: Lance Morin in Treatment: 85 Education Assessment Education Provided To: Patient Education Topics Provided Wound/Skin Impairment: Methods: Explain/Verbal Responses: State content correctly [employer]) Signed: 09/11/2023 4:05:37 PM By: Redmond Pulling RN, BSN Entered By: Redmond Pulling on 08/30/2023 13:50:24 [name] ([id number]) [id number] Page 6 of 8 -------------------------------------------------------------------------------- Wound Assessment Details Patient Name: Date of Service: [name] 08/30/2023 1:15 PM Medical Record Number: [id number] Patient Account Number: [id number] Date of Birth/Sex: Treating RN: 1961/05/31 (62 y.o. [name] Primary Care Caroly Purewal: [name] Other Clinician: Referring Kari Montero: Treating Rexton Greulich/Extender: [name], [name] Weeks in Treatment: 19 Wound Status Wound Number: 23 Primary Diabetic Wound/Ulcer of the Lower Extremity Etiology: Wound Location: Right Metatarsal head first Wound Open Wounding Event: Gradually Appeared Status: Date Acquired: 04/18/2021 Comorbid Chronic Obstructive Pulmonary  Disease (COPD), Sleep Apnea, Weeks Of Treatment: 19 History: Arrhythmia, Congestive Heart Failure, Coronary Artery Disease, Clustered Wound: No Hypertension, Peripheral Arterial Disease, Cirrhosis , Type II Diabetes, Osteoarthritis, Neuropathy Photos Wound Measurements Length: (cm) Width: (cm) Depth: (cm) Area: (cm) Volume: (cm) 1 % Reduction in Area: 24.9% 0.9 % Reduction in Volume: 70.1% 0.2 Epithelialization: Medium (34-66%) 0.707 Tunneling: No 0.141 Undermining: No Wound Description Classification: Grade 2 Wound Margin: Distinct, outline attached Exudate Amount: Medium Exudate Type: Serosanguineous Exudate Color: red, brown Foul Odor After Cleansing: No Slough/Fibrino Yes Wound Bed Granulation Amount: Large (67-100%) Exposed Structure Granulation Quality: Pink Fascia Exposed: No Necrotic Amount: Small (1-33%) Fat Layer (Subcutaneous Tissue) Exposed: Yes Necrotic Quality: Adherent Slough Tendon Exposed: No Muscle Exposed: No Joint Exposed: No Bone Exposed: No Periwound Skin Texture Texture Color No Abnormalities Noted: No No Abnormalities Noted: Yes Callus: Yes Temperature / Pain Temperature: No Abnormality Moisture No Abnormalities Noted: No Dry / Scaly: No Maceration: Yes Electronic Signature(Price) Signed: 09/11/2023 4:05:37 PM By: Redmond Pulling RN, BSN [name], [name] Comment ([id number]) [id number] Page 7 of 8 Entered By: Redmond Pulling on 08/30/2023 13:45:08 -------------------------------------------------------------------------------- Wound Assessment Details Patient Name: Date of Service: [name] 08/30/2023 1:15 PM Medical Record Number: [id number] Patient Account Number: [id number] Date of Birth/Sex: Treating  RN: 1960-12-02 (61 y.o. [name] Primary Care Mckade Gurka: [name] Other Clinician: Referring Meghan Warshawsky: Treating Messiah Rovira/Extender: [name], [name] Weeks in Treatment: 19 Wound  Status Wound Number: 29 Primary Trauma, Other Etiology: Wound Location: Right, Anterior Lower Leg Wound Open Wounding Event: Bump Status: Date Acquired: 08/20/2023 Comorbid Chronic Obstructive Pulmonary Disease (COPD), Sleep Apnea, Weeks Of Treatment: 1 History: Arrhythmia, Congestive Heart Failure, Coronary Artery Disease, Clustered Wound: No Hypertension, Peripheral Arterial Disease, Cirrhosis , Type II Diabetes, Osteoarthritis, Neuropathy Photos Wound Measurements Length: (cm) 1.1 Width: (cm) 1.4 Depth: (cm) 0.2 Area: (cm) 1.21 Volume: (cm) 0.242 % Reduction in Area: -2.7% % Reduction in Volume: -105.1% Epithelialization: None Tunneling: No Undermining: No Wound Description Classification: Full Thickness Without Exposed Suppor Exudate Amount: Medium Exudate Type: Serosanguineous Exudate Color: red, brown t Structures Foul Odor After Cleansing: No Slough/Fibrino Yes Wound Bed Granulation Amount: Large (67-100%) Exposed Structure Granulation Quality: Red Fat Layer (Subcutaneous Tissue) Exposed: Yes Necrotic Amount: Small (1-33%) Necrotic Quality: Eschar, Adherent Slough Periwound Skin Texture Texture Color No Abnormalities Noted: No No Abnormalities Noted: No Callus: No Atrophie Blanche: No Crepitus: No Cyanosis: No Excoriation: No Ecchymosis: No Induration: No Erythema: No Rash: No Hemosiderin Staining: No Scarring: No Mottled: No Pallor: No Moisture Rubor: No No Abnormalities Noted: No Dry / Scaly: No Maceration: No [name], [name] ([id number]) [id number] Page 8 of 8 Electronic Signature(Price) Signed: 09/11/2023 4:05:37 PM By: Redmond Pulling RN, BSN Entered By: Redmond Pulling on 08/30/2023 13:45:46 -------------------------------------------------------------------------------- Vitals Details Patient Name: Date of Service: Karla Bigness, DO [name]. 08/30/2023 1:15 PM Medical Record Number: [id number] Patient Account Number:  [id number] Date of Birth/Sex: Treating RN: [birth year] (61 y.o. F) Primary Care Felis Quillin: [name] Other Clinician: Referring Jolicia Delira: Treating Deneka Greenwalt/Extender: [name], [name] Weeks in Treatment: 19 Vital Signs Time Taken: 13:31 Temperature (F): 98.6 Height (in): 64 Pulse (bpm): 79 Weight (lbs): 225 Respiratory Rate (breaths/min): 18 Body Mass Index (BMI): 38.6 Blood Pressure (mmHg): 129/72 Reference Range: 80 - 120 mg / dl Electronic Signature(Price) Signed: 08/30/2023 4:39:33 PM By: Thayer Dallas Entered By: Thayer Dallas on 08/30/2023 13:33:25

## 2023-09-11 NOTE — Progress Notes (Unsigned)
[facility] [place]  ASSESSMENT / PLAN: [name] is a 62 y.o. female status post right femoropopliteal angioplasty and stenting (6x170mm + 6x44mm Eluvia) on 08/11/23  Recommend:  Abstinence from all tobacco products. Blood glucose control with goal A1c < 7%. Blood pressure control with goal blood pressure < 140/90 mmHg. Lipid reduction therapy with goal LDL-C <100 mg/dL  Aspirin 81mg  PO daily.  Clopidogrel 75mg  PO daily Atorvastatin 40-80mg  PO daily (or other "high intensity" statin therapy).  Plantar ulceration on the right foot is stable or somewhat improved.  She has highly challenging anatomy, and I do not think a bypass would remain patent for very long given her limited outflow and poor target vessel.  Furthermore chronic venous insufficiency and obesity make wound healing a problem.  I will plan to see her again in 1 month to monitor the progress of the foot.  She is at high risk for major amputation  CHIEF COMPLAINT: follow up PAD  HISTORY OF PRESENT ILLNESS: [name] is a 62 y.o. female who presents to clinic for evaluation of right foot ulceration after referral by PA Stone.  She is a prior patient of my partner, Dr. Tawanna Cooler Early who performed a left common femoral to posterior tibial artery bypass for limb threatening ischemia in 2020.  She has noticed development of a right plantar ulcer which is very painful to her.  She has previously undergone TMA.  I reviewed the rationale for an angiogram for limb salvage.  She is understanding and willing to proceed.  09/11/23: Patient returns to clinic for follow-up after angiogram.  I reviewed her angiogram results in detail personally and with her.  Patient essentially has a trifurcation occlusion with reconstitution of the distal peroneal artery with limited runoff in the foot.  Her right plantar ulcer is healing slowly, per her report.  She has many challenges to reconstruction with the bypass including  chronic venous insufficiency and obesity making wound healing difficult.  Past Medical History:  Diagnosis Date   Alcohol abuse    Alcoholic cirrhosis ([name])    Anemia    Anxiety    Arthritis    "knees" (11/26/2018)   B12 deficiency    CAD (coronary artery disease)    a. 11/10/2014 Cath: LM nl, LAD min irregs, D1 30 ost, D2 50d, LCX 62m, OM1 80 p/m (1.5 mm vessel), OM2 57m, RCA nondom 37m-->med rx.. Demand ischemia in the setting of rapid a-fib.   Cardiomyopathy ([name])    Carotid artery disease ([name])    a. 01/2015 Carotid Angio: RICA 100, LICA 95p; b. 02/2015 s/p L CEA; c. 05/2019 Carotid U/S: RICA 100. RECA >50. LICA 1-39%.   Cellulitis    lower extremities   CHF (congestive heart failure) ([name])    Chronic combined systolic and diastolic CHF (congestive heart failure) ([name])    a. 10/2014 Echo: EF 40-45%; b. 10/2018 Echo: EF 45-50%, gr2 DD; c. 11/2019 Echo: EF 50%, mild LVH, gr2 DD (restrictive), antlat HK, Nl RV fxn. Mild BAE. RVSP 59.13mmHg.   CKD (chronic kidney disease), stage III ([name])    Cocaine abuse ([name])    COPD (chronic obstructive pulmonary disease) ([name])    Critical lower limb ischemia ([name])    Depression    Diabetes mellitus without complication ([name])    Diabetic peripheral neuropathy ([name])    DVT (deep venous thrombosis) ([name])    Dyspnea    Elevated troponin    a. Chronic elevation.   GERD (  gastroesophageal reflux disease)    Hyperlipemia    Hypertension    Hypokalemia    Hypomagnesemia    Hypothyroidism    Marijuana abuse    Narcotic abuse ([name])    Noncompliance    NSVT (nonsustained ventricular tachycardia) ([name])    Obesity    PAF (paroxysmal atrial fibrillation) ([name])    Paroxysmal atrial tachycardia ([name])    Peripheral arterial disease ([name])    a. 01/2015 Angio/PTA: RSFA 99 (atherectomy/pta) - 1 vessel runoff via diff dzs peroneal; b. 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   Pneumonia    "once or twice" (11/26/2018)   Poorly controlled  type 2 diabetes mellitus ([name])    Renal disorder    Renal insufficiency    a. 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   Sleep apnea    "couldn't handle wearing the mask" (11/26/2018)   Symptomatic bradycardia    a. Avoid AV blocking agent per EP. Prev req temp wire in 2017.   Tobacco abuse     Past Surgical History:  Procedure Laterality Date   ABDOMINAL AORTOGRAM N/A 06/26/2019   Procedure: ABDOMINAL AORTOGRAM;  Surgeon: Iran Ouch, MD;  Location: [facility];  Service: Cardiovascular;  Laterality: N/A;   ABDOMINAL AORTOGRAM W/LOWER EXTREMITY N/A 06/07/2021   Procedure: ABDOMINAL AORTOGRAM W/LOWER EXTREMITY;  Surgeon: Runell Gess, MD;  Location: [facility];  Service: Cardiovascular;  Laterality: N/A;   ABDOMINAL AORTOGRAM W/LOWER EXTREMITY N/A 08/11/2023   Procedure: ABDOMINAL AORTOGRAM W/LOWER EXTREMITY;  Surgeon: Leonie Douglas, MD;  Location: [facility];  Service: Cardiovascular;  Laterality: N/A;   AMPUTATION Right 06/14/2017   Procedure: Right foot transmetatarsal amputation;  Surgeon: Nadara Mustard, MD;  Location: [facility] OR;  Service: Orthopedics;  Laterality: Right;   AMPUTATION Left 06/28/2019   Procedure: AMPUTATION LEFT FIFTH TOE;  Surgeon: Larina Earthly, MD;  Location: [facility] OR;  Service: Vascular;  Laterality: Left;   AMPUTATION TOE Right 04/28/2017   Procedure: AMPUTATION OF RIGHT SECOND RAY;  Surgeon: Nadara Mustard, MD;  Location: [facility] OR;  Service: Orthopedics;  Laterality: Right;   CARDIAC CATHETERIZATION     CARDIAC CATHETERIZATION N/A 01/12/2016   Procedure: Temporary Wire;  Surgeon: Rollene Rotunda, MD;  Location: [facility];  Service: Cardiovascular;  Laterality: N/A;   CARDIOVERSION  ~ 02/2013   "twice"    CAROTID ANGIOGRAM N/A 01/15/2015   Procedure: CAROTID ANGIOGRAM;  Surgeon: Runell Gess, MD;  Location: [facility] CATH LAB;  Service: Cardiovascular;  Laterality: N/A;   DILATION AND CURETTAGE OF UTERUS  1988   ENDARTERECTOMY Left 02/19/2015   Procedure:  LEFT CAROTID ENDARTERECTOMY ;  Surgeon: Nada Libman, MD;  Location: [facility] OR;  Service: Vascular;  Laterality: Left;   FEMORAL-TIBIAL BYPASS GRAFT Left 06/28/2019   Procedure: BYPASS GRAFT LEFT LEG FEMORAL TO ANTERIOR TIBIAL ARTERY using LEFT GREATER SAPHENOUS VEIN;  Surgeon: Larina Earthly, MD;  Location: [facility] OR;  Service: Vascular;  Laterality: Left;   LEFT HEART CATHETERIZATION WITH CORONARY ANGIOGRAM N/A 10/31/2014   Procedure: LEFT HEART CATHETERIZATION WITH CORONARY ANGIOGRAM;  Surgeon: Kathleene Hazel, MD;  Location: [facility] CATH LAB;  Service: Cardiovascular;  Laterality: N/A;   LOWER EXTREMITY ANGIOGRAM N/A 09/10/2013   Procedure: LOWER EXTREMITY ANGIOGRAM;  Surgeon: Runell Gess, MD;  Location: [facility] CATH LAB;  Service: Cardiovascular;  Laterality: N/A;   LOWER EXTREMITY ANGIOGRAM N/A 01/15/2015   Procedure: LOWER EXTREMITY ANGIOGRAM;  Surgeon: Runell Gess, MD;  Location: [facility] CATH LAB;  Service: Cardiovascular;  Laterality: N/A;   LOWER EXTREMITY ANGIOGRAPHY N/A 04/13/2017   Procedure: Lower Extremity Angiography;  Surgeon: Runell Gess, MD;  Location: [facility] INVASIVE CV LAB;  Service: Cardiovascular;  Laterality: N/A;   LOWER EXTREMITY ANGIOGRAPHY Left 06/26/2019   Procedure: LOWER EXTREMITY ANGIOGRAPHY;  Surgeon: Iran Ouch, MD;  [facility];  Service: Cardiovascular;  Laterality: Left;   PERIPHERAL VASCULAR ATHERECTOMY Right 06/07/2021   Procedure: PERIPHERAL VASCULAR ATHERECTOMY;  Surgeon: Runell Gess, MD;  Location: [facility] INVASIVE CV LAB;  Service: Cardiovascular;  Laterality: Right;   PERIPHERAL VASCULAR BALLOON ANGIOPLASTY Left 06/26/2019   Procedure: PERIPHERAL VASCULAR BALLOON ANGIOPLASTY;  Surgeon: Iran Ouch, MD;  [facility]: [facility];  Service: Cardiovascular;  Laterality: Left;  unable to cross lt sfa occlusion   PERIPHERAL VASCULAR INTERVENTION  04/13/2017   Procedure: Peripheral Vascular Intervention;  Surgeon: Runell Gess, MD;   Location: [facility] INVASIVE CV LAB;  Service: Cardiovascular;;   PERIPHERAL VASCULAR INTERVENTION  08/11/2023   Procedure: PERIPHERAL VASCULAR INTERVENTION;  Surgeon: Leonie Douglas, MD;  Location: [facility];  Service: Cardiovascular;;   PRESSURE SENSOR/CARDIOMEMS N/A 02/05/2020   Procedure: PRESSURE SENSOR/CARDIOMEMS;  Surgeon: Laurey Morale, MD;  Location: [facility] INVASIVE CV LAB;  Service: Cardiovascular;  Laterality: N/A;   VEIN HARVEST Left 06/28/2019   Procedure: LEFT LEG GREATER SAPHENOUS VEIN HARVEST;  Surgeon: Larina Earthly, MD;  Location: [facility] OR;  Service: Vascular;  Laterality: Left;    Family History  Problem Relation Age of Onset   Hypertension Mother    Diabetes Mother    Cancer Mother        breast, ovarian, colon   Clotting disorder Mother    Heart disease Mother    Heart attack Mother    Breast cancer Mother        in 58's   Hypertension Father    Heart disease Father    Emphysema Sister        smoked    Social History   Socioeconomic History   Marital status: Single    Spouse name: Not on file   Number of children: 1   Years of education: 12   Highest education level: 12th grade  Occupational History   Occupation: disabled  Tobacco Use   Smoking status: Former    Current packs/day: 1.00    Average packs/day: 1 pack/day for 44.0 years (44.0 ttl pk-yrs)    Types: E-cigarettes, Cigarettes   Smokeless tobacco: Former    Types: Snuff  Vaping Use   Vaping status: Former   Devices: 11/26/2018 "stopped months ago"  Substance and Sexual Activity   Alcohol use: Not Currently    Comment: occ   Drug use: Not Currently    Types: "Crack" cocaine, Marijuana, Oxycodone   Sexual activity: Not Currently  Other Topics Concern   Not on file  Social History Narrative   ** Merged History Encounter **       Lives in [place], in motel with sister.  They are looking to move but don't have a place to go yet.     Social Determinants of Health   Financial Resource  Strain: Low Risk  (02/09/2023)   Overall Financial Resource Strain (CARDIA)    Difficulty of Paying Living Expenses: Not hard at all  Food Insecurity: No Food Insecurity (02/09/2023)   Hunger Vital Sign    Worried About Running  Out of Food in the Last Year: Never true    Ran Out of Food in the Last Year: Never true  Transportation Needs: No Transportation Needs (02/09/2023)   PRAPARE - Administrator, Civil Service (Medical): No    Lack of Transportation (Non-Medical): No  Physical Activity: Inactive (02/09/2023)   Exercise Vital Sign    Days of Exercise per Week: 0 days    Minutes of Exercise per Session: 0 min  Stress: No Stress Concern Present (02/09/2023)   [employer] of Occupational Health - Occupational Stress Questionnaire    Feeling of Stress : Not at all  Social Connections: Unknown (08/21/2023)   [facility]   Social Network    Social Network: Not on file  Intimate Partner Violence: Unknown (08/21/2023)   Received from [facility]   HITS    Physically Hurt: Not on file    Insult or Talk Down To: Not on file    Threaten Physical Harm: Not on file    Scream or Curse: Not on file    Allergies  Allergen Reactions   Gabapentin Nausea And Vomiting and Other (See Comments)    POSSIBLE SHAKING   Lyrica [Pregabalin] Other (See Comments)    Shaking     Current Outpatient Medications  Medication Sig Dispense Refill   ACCU-CHEK GUIDE test strip USE TO CHECK BLOOD SUGAR FOUR TIMES DAILY     acetaminophen (TYLENOL) 500 MG tablet Take 1,000 mg by mouth every 6 (six) hours as needed for headache (pain).     albuterol (VENTOLIN HFA) 108 (90 Base) MCG/ACT inhaler USE 2 PUFFS BY MOUTH EVERY FOUR HOURS, AS NEEDED, FOR COUGHING/WHEEZING 8.5 g 0   apixaban (ELIQUIS) 5 MG TABS tablet Take 1 tablet (5 mg total) by mouth 2 (two) times daily. 60 tablet 6   budesonide-formoterol (SYMBICORT) 160-4.5 MCG/ACT inhaler Inhale 2 puffs into the lungs 2 (two) times  daily. 1 each 2   buPROPion ER (WELLBUTRIN SR) 100 MG 12 hr tablet Take 1 tablet (100 mg total) by mouth daily. 30 tablet 6   Cholecalciferol (VITAMIN D3 PO) Take 1 capsule by mouth every morning.     colchicine 0.6 MG tablet Take 1.2 mg by mouth See admin instructions. Take 1.2 mg for flair up and an hour later take 0.6 mg if needed     doxycycline (VIBRAMYCIN) 100 MG capsule Take 100 mg by mouth 2 (two) times daily.     EASY COMFORT PEN NEEDLES 31G X 5 MM MISC USE FOUR TIMES A DAY FOR INSULIN ADMINISTRATION 100 each 1   icosapent Ethyl (VASCEPA) 1 g capsule Take 2 capsules (2 g total) by mouth 2 (two) times daily. 120 capsule 6   insulin lispro (HUMALOG) 100 UNIT/ML KwikPen Inject 10 Units into the skin 3 (three) times daily with meals. 15 mL 0   LANTUS SOLOSTAR 100 UNIT/ML Solostar Pen Inject 37 Units into the skin daily. (Patient taking differently: Inject 40 Units into the skin daily.) 15 mL 0   levothyroxine (SYNTHROID) 50 MCG tablet TAKE 1 TABLET (50 MCG TOTAL) BY MOUTH DAILY BEFORE BREAKFAST (AM) 30 tablet 0   losartan (COZAAR) 25 MG tablet TAKE 2 TABLETS (50 MG TOTAL) BY MOUTH DAILY (AM) 60 tablet 3   metolazone (ZAROXOLYN) 2.5 MG tablet TO BE USED AS DIRECTED BY HEART FAILURE CLINIC 10 tablet 0   metoprolol tartrate (LOPRESSOR) 25 MG tablet Take 0.5 tablets (12.5 mg total) by mouth 2 (two) times daily.  60 tablet 1   nystatin cream (MYCOSTATIN) Apply topically 2 (two) times daily. 30 g 0   Omega-3 Fatty Acids (FISH OIL PO) Take 1 capsule by mouth every morning.     pantoprazole (PROTONIX) 40 MG tablet TAKE 1 TABLET (40 MG TOTAL) BY MOUTH DAILY(AM) 30 tablet 0   polyethylene glycol (MIRALAX / GLYCOLAX) 17 g packet Take 17 g by mouth daily as needed for moderate constipation. 14 each 0   rosuvastatin (CRESTOR) 40 MG tablet TAKE 1 TABLET (40 MG TOTAL) BY MOUTH DAILY (BEDTIME) 90 tablet 0   torsemide (DEMADEX) 100 MG tablet TAKE 1 TABLET (100 MG TOTAL) BY MOUTH 2 (TWO) TIMES DAILY.  (AM+BEDTIME) 60 tablet 3   triamcinolone ointment (KENALOG) 0.1 % Apply topically 2 (two) times daily. 454 g 0   VITAMIN E PO Take 1 tablet by mouth daily.     No current facility-administered medications for this visit.    PHYSICAL EXAM Vitals:   09/12/23 0902  BP: 130/74  Pulse: 71  Resp: 20  Temp: 98.1 F (36.7 C)  SpO2: 96%  Weight: 223 lb (101.2 kg)  Height: 5\' 4"  (1.626 m)     Chronically unwell woman.  Obese. Left dorsalis pedis pulse weakly palpable Left fifth toe surgically absent No palpable pulses in right foot Well-healed right transmetatarsal amputation Right plantar foot ulceration about the size of a nickel with clean edges and base  PERTINENT LABORATORY AND RADIOLOGIC DATA  Most recent CBC    Latest Ref Rng & Units 08/16/2023    2:50 PM 08/11/2023    8:07 AM 08/03/2023   10:14 AM  CBC  WBC 4.0 - 10.5 K/uL 12.2   13.5   Hemoglobin 12.0 - 15.0 g/dL 9.8  86.5  7.9   Hematocrit 36.0 - 46.0 % 32.7  30.0  25.6   Platelets 150 - 400 K/uL 319   325      Most recent CMP    Latest Ref Rng & Units 08/21/2023    2:46 PM 08/16/2023    2:50 PM 08/11/2023    8:07 AM  CMP  Glucose 70 - 99 mg/dL 784  696  295   BUN 6 - 20 mg/dL 66  60  284   Creatinine 0.44 - 1.00 mg/dL 1.32  4.40  1.02   Sodium 135 - 145 mmol/L 133  134  136   Potassium 3.5 - 5.1 mmol/L 4.2  5.9  4.0   Chloride 98 - 111 mmol/L 96  100  94   CO2 22 - 32 mmol/L 23  24    Calcium 8.9 - 10.3 mg/dL 8.3  8.3      Renal function CrCl cannot be calculated (Patient's most recent lab result is older than the maximum 21 days allowed.).  Hemoglobin A1C (no units)  Date Value  06/21/2017 9.8   HbA1c, POC (controlled diabetic range) (%)  Date Value  09/13/2022 9.6 (A)   Hgb A1c MFr Bld (%)  Date Value  07/27/2023 11.1 (H)    LDL Cholesterol  Date Value Ref Range Status  11/29/2022 46 0 - 99 mg/dL Final    Comment:           Total Cholesterol/HDL:CHD Risk Coronary Heart Disease Risk  Table                     Men   Women  1/2 Average Risk   3.4   3.3  Average Risk  5.0   4.4  2 X Average Risk   9.6   7.1  3 X Average Risk  23.4   11.0        Use the calculated Patient Ratio above and the CHD Risk Table to determine the patient's CHD Risk.        ATP III CLASSIFICATION (LDL):  <100     mg/dL   Optimal  811-914  mg/dL   Near or Above                    Optimal  130-159  mg/dL   Borderline  782-956  mg/dL   High  >213     mg/dL   Very High Performed at [facility], [address], [place], [place] [address]    Direct LDL  Date Value Ref Range Status  04/04/2022 90.8 0 - 99 mg/dL Final    Comment:    Performed at [facility], [address] [place], [place] [address]     +-------+-----------+-----------+------------+------------+  ABI/TBIToday's ABIToday's TBIPrevious ABIPrevious TBI  +-------+-----------+-----------+------------+------------+  Right 0.6        amp        0.57        amp           +-------+-----------+-----------+------------+------------+  Left  1.03       0.72       1.04        1             +-------+-----------+-----------+------------+------------+   Lower extremity duplex Patent stenting of the right lower extremity  Rande Brunt. Lenell Antu, MD FACS [facility] Phone Number: [phone] 09/12/2023 10:56 AM   Total time spent on preparing this encounter including chart review, data review, collecting history, examining the patient, coordinating care for this established patient, 30 minutes  Portions of this report may have been transcribed using voice recognition software.  Every effort has been made to ensure accuracy; however, inadvertent computerized transcription errors may still be present.

## 2023-09-11 NOTE — Telephone Encounter (Signed)
Pt called after receiving phone call from a mail order pharmacy.  Pt says DO NOT send any medication to anywhere except [facility].  She is not interested in mail order.

## 2023-09-12 ENCOUNTER — Ambulatory Visit (INDEPENDENT_AMBULATORY_CARE_PROVIDER_SITE_OTHER)
Admit: 2023-09-12 | Discharge: 2023-09-12 | Disposition: A | Discharge status: Home / Self Care | Payer: 59 | Attending: Vascular Surgery | Admitting: Vascular Surgery

## 2023-09-12 ENCOUNTER — Ambulatory Visit (INDEPENDENT_AMBULATORY_CARE_PROVIDER_SITE_OTHER): Payer: 59 | Admitting: Vascular Surgery

## 2023-09-12 ENCOUNTER — Other Ambulatory Visit (HOSPITAL_COMMUNITY): Payer: Self-pay

## 2023-09-12 ENCOUNTER — Encounter: Payer: Self-pay | Admitting: Vascular Surgery

## 2023-09-12 ENCOUNTER — Ambulatory Visit (HOSPITAL_COMMUNITY)
Admission: RE | Admit: 2023-09-12 | Discharge: 2023-09-12 | Disposition: A | Discharge status: Home / Self Care | Payer: 59 | Source: Ambulatory Visit | Attending: Vascular Surgery | Admitting: Vascular Surgery

## 2023-09-12 VITALS — BP 130/74 | HR 71 | Temp 98.1°F | Resp 20 | Ht 64.0 in | Wt 223.0 lb

## 2023-09-12 DIAGNOSIS — I70235 Atherosclerosis of native arteries of right leg with ulceration of other part of foot: Secondary | ICD-10-CM | POA: Diagnosis present

## 2023-09-12 LAB — VAS US ABI WITH/WO TBI
Left ABI: 1.03
Right ABI: 0.6

## 2023-09-12 NOTE — Progress Notes (Signed)
Paramedicine Encounter    Patient ID: [name], female    DOB: May 06, 1961, 62 y.o.   MRN: [id number]   Complaints-leg pain   Edema-yes  Compliance with meds-yes  Pill box filled-yes If so, by whom-paramedic   She is wanting bubble packs again, she has clinic in nov, and she has meds thru the mail order right now, so will have to wait until those mail order can be filled at local pharmacy to start the bubble packs.   Refills needed-vascepa, wellbutrin    Pt had doc appoint this morning about her legs/feet. She reports he wants her back in a month, she has more blockages to the rt leg-the one with the most swelling to it- but he does not want to do it now due to chance of getting infected, so he wants to see her back in a month.  She denies increased sob, no dizziness, no c/p.  She does have swelling to her legs, she said the [facility] doc didn't seem too concerned about that since she does have blockages which is making the circulation worse. Very concerned with possible major amputation in her future.  Her swelling seems stable from previous visits.  She does good with med compliance.  Meds verified and pill box refilled.  She is missing the levothyroxine in pill box.  Sent message to pharmacy to fill this.   He responded after I left and it had come thru mail order-- I did forget that she had 2 meds come in bubble pack from a mail order.  I called her back and told her to look for the box and let me know when she finds it and I can come out and place them in pill box.   Last used cardiomems on 10/21  LMP  (LMP Unknown)  Weight yesterday-? Last visit weight-?  Patient Care Team: [name], MD as PCP - General (Family Medicine) Runell Gess, MD as PCP - Cardiology (Cardiology) Lanier Prude, MD as PCP - Electrophysiology (Cardiology) Runell Gess, MD as Consulting Physician (Cardiology) Nyoka Cowden, MD as Consulting Physician (Pulmonary  Disease) System, Provider Not In  Patient Active Problem List   Diagnosis Date Noted   Severe sepsis with acute organ dysfunction ([name]) 07/27/2023   Urinary tract infection without hematuria 07/27/2023   Encephalopathy acute 07/27/2023   Acute renal failure superimposed on stage 3 chronic kidney disease ([name]) 07/27/2023   Severe sepsis ([name]) 07/27/2023   Pre-ulcerative calluses 04/25/2022   Viral gastroenteritis 04/18/2022   Hyperlipidemia associated with type 2 diabetes mellitus ([name]) 07/29/2021   Uncontrolled type 2 diabetes mellitus with hyperglycemia ([name]) 06/13/2021   Acute kidney injury superimposed on CKD ([name]) 06/13/2021   Critical ischemia of foot ([name]) 06/07/2021   Fall 05/05/2021   PAF (paroxysmal atrial fibrillation) ([name]) 05/05/2021   COPD (chronic obstructive pulmonary disease) ([name]) 05/05/2021   DM (diabetes mellitus), type 2 with complications ([name]) 05/05/2021   PAD (peripheral artery disease) ([name]) 05/05/2021   Cellulitis 05/05/2021   Acute on chronic combined systolic and diastolic CHF (congestive heart failure) ([name]) 05/05/2021   OSA (obstructive sleep apnea) 05/05/2021   Stage 3b chronic kidney disease ([name]) 05/05/2021   AKI (acute kidney injury) ([name]) 05/04/2021   Pressure injury of skin 05/04/2021   Ulcer of foot, unspecified laterality, limited to breakdown of skin ([name]) 02/17/2021   AF (paroxysmal atrial fibrillation) ([name]) 01/02/2021   CAD (coronary artery disease) 01/02/2021   PVD (peripheral vascular disease) ([name]) 01/02/2021  Diabetes mellitus type 2, uncontrolled, with complications 01/02/2021   Diabetic nephropathy ([name]) 01/02/2021   Low back pain 06/01/2020   Hyperlipidemia 04/03/2020   Muscle weakness 03/20/2020   Pain in elbow 03/12/2020   PAD (peripheral artery disease) ([name]) 12/26/2019   Acute renal failure ([name])    Acute respiratory failure with hypoxia ([name]) 12/02/2019   Cellulitis in diabetic foot ([name]) 07/08/2019   Osteomyelitis ([name])  06/21/2019   NICM (nonischemic cardiomyopathy) ([name]) 06/20/2019   Non-healing ulcer ([name]) 06/20/2019   CHF (congestive heart failure) ([name]) 11/26/2018   Coagulation disorder ([name]) 08/09/2017   Depression 07/21/2017   At risk for adverse drug reaction 06/20/2017   Peripheral neuropathy 06/20/2017   S/P transmetatarsal amputation of foot, right ([name]) 06/05/2017   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) 05/19/2017   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[name])    SIRS (systemic inflammatory response syndrome) ([name]) 04/06/2017   Suspected sleep apnea 11/24/2016   Ulcer of toe of right foot, with necrosis of bone ([name]) 10/27/2016   Ulcer of left lower leg ([name]) 05/19/2016   Severe obesity (BMI >= 40) ([name]) 02/24/2016   Morbid obesity ([name]) 02/24/2016   Encounter for therapeutic drug monitoring 02/10/2016   Symptomatic bradycardia 01/12/2016   Hypertension associated with diabetes ([name]) 12/22/2015   Chronic combined systolic and diastolic CHF (congestive heart failure) ([name])    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 11/08/2015   Hypokalemia 11/08/2015   Tobacco abuse 10/23/2015   Coronary artery disease    DOE (dyspnea on exertion) 04/29/2015   Diabetes mellitus type 2, uncontrolled 02/08/2015   Influenza A 02/07/2015   Wrist fracture, left, closed, initial encounter 01/29/2015   PAF (paroxysmal atrial fibrillation) ([name]) 01/16/2015   Carotid arterial disease ([name]) 01/16/2015   Claudication ([name]) 01/15/2015   Demand ischemia ([name]) 10/29/2014   Insomnia 02/03/2014   S/P peripheral artery angioplasty - TurboHawk atherectomy; R SFA 09/11/2013    Class: Acute   Leg pain, bilateral 08/19/2013   Hypothyroidism 07/31/2013   Cellulitis 06/13/2013   History of cocaine abuse ([name]) 06/13/2013   Long term current use of anticoagulant therapy 05/20/2013   Alcohol abuse    Narcotic abuse ([name])    Marijuana abuse    Alcoholic cirrhosis ([name])    DM  (diabetes mellitus), type 2 with peripheral vascular complications ([name])     Current Outpatient Medications:    ACCU-CHEK GUIDE test strip, USE TO CHECK BLOOD SUGAR FOUR TIMES DAILY, Disp: , Rfl:    acetaminophen (TYLENOL) 500 MG tablet, Take 1,000 mg by mouth every 6 (six) hours as needed for headache (pain)., Disp: , Rfl:    albuterol (VENTOLIN HFA) 108 (90 Base) MCG/ACT inhaler, USE 2 PUFFS BY MOUTH EVERY FOUR HOURS, AS NEEDED, FOR COUGHING/WHEEZING, Disp: 8.5 g, Rfl: 0   apixaban (ELIQUIS) 5 MG TABS tablet, Take 1 tablet (5 mg total) by mouth 2 (two) times daily., Disp: 60 tablet, Rfl: 6   budesonide-formoterol (SYMBICORT) 160-4.5 MCG/ACT inhaler, Inhale 2 puffs into the lungs 2 (two) times daily., Disp: 1 each, Rfl: 2   buPROPion ER (WELLBUTRIN SR) 100 MG 12 hr tablet, Take 1 tablet (100 mg total) by mouth daily., Disp: 30 tablet, Rfl: 6   Cholecalciferol (VITAMIN D3 PO), Take 1 capsule by mouth every morning., Disp: , Rfl:    colchicine 0.6 MG tablet, Take 1.2 mg by mouth See admin instructions. Take 1.2 mg for flair up and an hour later take 0.6 mg if needed, Disp: ,  Rfl:    doxycycline (VIBRAMYCIN) 100 MG capsule, Take 100 mg by mouth 2 (two) times daily., Disp: , Rfl:    EASY COMFORT PEN NEEDLES 31G X 5 MM MISC, USE FOUR TIMES A DAY FOR INSULIN ADMINISTRATION, Disp: 100 each, Rfl: 1   icosapent Ethyl (VASCEPA) 1 g capsule, Take 2 capsules (2 g total) by mouth 2 (two) times daily., Disp: 120 capsule, Rfl: 6   insulin lispro (HUMALOG) 100 UNIT/ML KwikPen, Inject 10 Units into the skin 3 (three) times daily with meals., Disp: 15 mL, Rfl: 0   LANTUS SOLOSTAR 100 UNIT/ML Solostar Pen, Inject 37 Units into the skin daily. (Patient taking differently: Inject 40 Units into the skin daily.), Disp: 15 mL, Rfl: 0   levothyroxine (SYNTHROID) 50 MCG tablet, TAKE 1 TABLET (50 MCG TOTAL) BY MOUTH DAILY BEFORE BREAKFAST (AM), Disp: 30 tablet, Rfl: 0   losartan (COZAAR) 25 MG tablet, TAKE 2 TABLETS (50 MG  TOTAL) BY MOUTH DAILY (AM), Disp: 60 tablet, Rfl: 3   metolazone (ZAROXOLYN) 2.5 MG tablet, TO BE USED AS DIRECTED BY HEART FAILURE CLINIC, Disp: 10 tablet, Rfl: 0   metoprolol tartrate (LOPRESSOR) 25 MG tablet, Take 0.5 tablets (12.5 mg total) by mouth 2 (two) times daily., Disp: 60 tablet, Rfl: 1   nystatin cream (MYCOSTATIN), Apply topically 2 (two) times daily., Disp: 30 g, Rfl: 0   Omega-3 Fatty Acids (FISH OIL PO), Take 1 capsule by mouth every morning., Disp: , Rfl:    pantoprazole (PROTONIX) 40 MG tablet, TAKE 1 TABLET (40 MG TOTAL) BY MOUTH DAILY(AM), Disp: 30 tablet, Rfl: 0   polyethylene glycol (MIRALAX / GLYCOLAX) 17 g packet, Take 17 g by mouth daily as needed for moderate constipation., Disp: 14 each, Rfl: 0   rosuvastatin (CRESTOR) 40 MG tablet, TAKE 1 TABLET (40 MG TOTAL) BY MOUTH DAILY (BEDTIME), Disp: 90 tablet, Rfl: 0   torsemide (DEMADEX) 100 MG tablet, TAKE 1 TABLET (100 MG TOTAL) BY MOUTH 2 (TWO) TIMES DAILY. (AM+BEDTIME), Disp: 60 tablet, Rfl: 3   triamcinolone ointment (KENALOG) 0.1 %, Apply topically 2 (two) times daily., Disp: 454 g, Rfl: 0   VITAMIN E PO, Take 1 tablet by mouth daily., Disp: , Rfl:  Allergies  Allergen Reactions   Gabapentin Nausea And Vomiting and Other (See Comments)    POSSIBLE SHAKING   Lyrica [Pregabalin] Other (See Comments)    Shaking       Social History   Socioeconomic History   Marital status: Single    Spouse name: Not on file   Number of children: 1   Years of education: 12   Highest education level: 12th grade  Occupational History   Occupation: disabled  Tobacco Use   Smoking status: Former    Current packs/day: 1.00    Average packs/day: 1 pack/day for 44.0 years (44.0 ttl pk-yrs)    Types: E-cigarettes, Cigarettes   Smokeless tobacco: Former    Types: Snuff  Vaping Use   Vaping status: Former   Devices: 11/26/2018 "stopped months ago"  Substance and Sexual Activity   Alcohol use: Not Currently    Comment: occ    Drug use: Not Currently    Types: "Crack" cocaine, Marijuana, Oxycodone   Sexual activity: Not Currently  Other Topics Concern   Not on file  Social History Narrative   ** Merged History Encounter **       Lives in [place], in motel with sister.  They are looking to move but don't have a  place to go yet.     Social Determinants of Health   Financial Resource Strain: Low Risk  (02/09/2023)   Overall Financial Resource Strain (CARDIA)    Difficulty of Paying Living Expenses: Not hard at all  Food Insecurity: No Food Insecurity (02/09/2023)   Hunger Vital Sign    Worried About Running Out of Food in the Last Year: Never true    Ran Out of Food in the Last Year: Never true  Transportation Needs: No Transportation Needs (02/09/2023)   PRAPARE - Administrator, Civil Service (Medical): No    Lack of Transportation (Non-Medical): No  Physical Activity: Inactive (02/09/2023)   Exercise Vital Sign    Days of Exercise per Week: 0 days    Minutes of Exercise per Session: 0 min  Stress: No Stress Concern Present (02/09/2023)   [employer] of Occupational Health - Occupational Stress Questionnaire    Feeling of Stress : Not at all  Social Connections: Unknown (08/21/2023)   Received from [facility]   Social Network    Social Network: Not on file  Intimate Partner Violence: Unknown (08/21/2023)   Received from [facility]   HITS    Physically Hurt: Not on file    Insult or Talk Down To: Not on file    Threaten Physical Harm: Not on file    Scream or Curse: Not on file    Physical Exam      Future Appointments  Date Time Provider Department Center  09/13/2023 12:30 PM Allen Derry [place] [facility] [place] [facility]  09/20/2023 12:30 PM Allen Derry [place] III, PA-C [facility]  09/27/2023 12:30 PM Allen Derry [place] III, PA-C [facility]  10/03/2023  2:20 PM Laurey Morale, MD [facility] None  10/10/2023 11:00 AM Leonie Douglas, MD [facility]  02/12/2024  4:00 PM  [facility] VISIT [facility] None       Kerry Hough, Paramedic [phone] [facility] Paramedic  09/12/23

## 2023-09-13 ENCOUNTER — Encounter (HOSPITAL_BASED_OUTPATIENT_CLINIC_OR_DEPARTMENT_OTHER): Payer: 59 | Admitting: Physician Assistant

## 2023-09-13 DIAGNOSIS — I87331 Chronic venous hypertension (idiopathic) with ulcer and inflammation of right lower extremity: Secondary | ICD-10-CM | POA: Diagnosis not present

## 2023-09-13 NOTE — Progress Notes (Addendum)
Karla, Price ([id number]) [id number] Page 1 of 14 Visit Report for 09/13/2023 Chief Complaint Document Details Patient Name: Date of Service: [name] 09/13/2023 12:30 PM Medical Record Number: [id number] Patient Account Number: [id number] Date of Birth/Sex: Treating RN: Nov 09, 1961 (62 y.o. F) Primary Care Provider: [name] Other Clinician: Referring Provider: Treating Provider/Extender: [name], [name] Weeks in Treatment: 21 Information Obtained from: Patient Chief Complaint Bilateral LE Ulcers Electronic Signature(s) Signed: 09/13/2023 12:55:47 PM By: Allen Derry PA-C Entered By: Allen Derry on 09/13/2023 09:55:47 -------------------------------------------------------------------------------- Debridement Details Patient Name: Date of Service: Karla Bigness, [name] 09/13/2023 12:30 PM Medical Record Number: [id number] Patient Account Number: [id number] Date of Birth/Sex: Treating RN: Dec 07, 1960 (61 y.o. [name], Millard.Loa Primary Care Provider: [name] Other Clinician: Referring Provider: Treating Provider/Extender: Juleen China Weeks in Treatment: 21 Debridement Performed for Assessment: Wound #23 Right Metatarsal head first Performed By: Physician Lenda Kelp, PA The following information was scribed by: Shawn Stall The information was scribed for: Lenda Kelp Debridement Type: Debridement Severity of Tissue Pre Debridement: Fat layer exposed Level of Consciousness (Pre-procedure): Awake and Alert Pre-procedure Verification/Time Out Yes - 13:00 Taken: Start Time: 13:01 Pain Control: Lidocaine 4% T opical Solution Percent of Wound Bed Debrided: 100% T Area Debrided (cm): otal 0.6 Tissue and other material debrided: Viable, Non-Viable, Callus, Slough, Subcutaneous, Skin: Dermis , Skin: Epidermis, Slough Level: Skin/Subcutaneous Tissue Debridement Description:  Excisional Instrument: Curette Bleeding: Minimum Hemostasis Achieved: Pressure End Time: 13:08 Procedural Pain: 0 Post Procedural Pain: 0 Response to Treatment: Procedure was tolerated well Level of Consciousness (Post- Awake and Alert procedure): Post Debridement Measurements of Total Wound Length: (cm) 1.1 Width: (cm) 0.7 Depth: (cm) 0.2 Niesen, Cheris S ([id number]) [id number] Page 2 of 14 Volume: (cm) 0.121 Character of Wound/Ulcer Post Debridement: Improved Severity of Tissue Post Debridement: Fat layer exposed Post Procedure Diagnosis Same as Pre-procedure Electronic Signature(s) Signed: 09/13/2023 4:33:40 PM By: Allen Derry PA-C Signed: 09/14/2023 6:03:32 PM By: Shawn Stall RN, BSN Entered By: Shawn Stall on 09/13/2023 10:09:04 -------------------------------------------------------------------------------- HPI Details Patient Name: Date of Service: Karla Bigness, [name] 09/13/2023 12:30 PM Medical Record Number: [id number] Patient Account Number: [id number] Date of Birth/Sex: Treating RN: 1961-05-14 (61 y.o. F) Primary Care Provider: [name] Other Clinician: Referring Provider: Treating Provider/Extender: [name], [name] Weeks in Treatment: 21 History of Present Illness HPI Description: This 62 year old patient who has a very long significant history of diabetes mellitus, previous alcohol and nicotine abuse, chronic data disease, COPD, diabetes mellitus, height hypertension, critical lower limb ischemia with several wounds being managed at the wound Center at [facility] for over a year. She was recently in the ER at [facility] and was referred to our center. He has had a long history of critical limb ischemia and over a period of time has had balloon angioplasties in March 2016, endarterectomy of the left carotid, lower extremity angiogram and treatment by Dr. Nanetta Batty, several cardiac  catheterizations. Most recently she had an x-ray of her left foot while in the ER which showed no acute abnormality. during this ER visit she was started on ciprofloxacin and asked to continue with the wound care physicians at [facility]. Her last ABI done in June 2017 showed the right side to be 0.28 in the left side to be 0.48. Her right toe brachial index was 0.17 on the right and 0.27 on the left. her last hemoglobin A1c was 11.1%  She continues to smoke about a pack of cigarettes a day. 03/28/2017 -- -- right foot x-ray -- IMPRESSION:Areas of soft tissue swelling. Mild subluxation second PIP joint. No frank dislocation or fracture. No erosive change or bony destruction. No soft tissue ulceration or radiopaque foreign body evident by radiography. There is plantar fascia calcification with a nearby inferior calcaneal spur. There are foci of arterial vascular calcification. 04/04/2017 -- the patient continues to be noncompliant and continues to complain of a lot of pain and has not done anything about quitting smoking Readmission: 06/26/18 on evaluation today patient presents for reevaluation she has not been seen in our office since May 2018. Since she was last seen here in our office she has undergone testing at Dr. Durenda Age office where it was revealed that she had an ABI of 0.68 with her previous ABI being 0.64 and a left ABI of 0.38 with previous ABI being 0.34 this study which was performed on 04/05/18 was pretty much equivalent to the study performed on 11/22/17. The findings in the end revealed on the right would appear to be moderate right lower extremity arterial disease on the left Dr. Allyson Sabal states moderate in the report but unfortunately this appears to be much more severe at 0.38 compared to the right. Subsequently the patient was scheduled to have angiography with Dr. Allyson Sabal on 04/12/18. This was however canceled due to the fact that the patient was found to have chronic kidney  disease stage III and it was to the point that he did not feel that it will be safe to pursue angiography at that point. She has not been on any antibiotics recently. At this point Dr. Allyson Sabal has not rescheduled anything as far as the injured [facility] according to the patient he is somewhat reluctant to do so. Nonetheless with her diminished blood flow this is gonna make it somewhat difficult for her to heal. 07/05/18; this is a patient I have not seen previously. She has very significant PAD as noted above. She apparently has had revascularization efforts by Dr. Allyson Sabal on the right on 3 occasions to the patient. She was supposed to have an attempted angiography on the left however this was canceled apparently because of stage III chronic renal failure. I will need to research all of this. She complains of significant pain in the wound and has claudication enough that when she walks to the end of the driveway she has to stop. She is been using silver alginate to the wounds on her legs and Iodoflex to the area on her left second toe. 07/13/18 on evaluation today patient's wounds actually appear to be doing about the same. She has an appointment she tells me within the next month that is September 2019 with Dr. Allyson Sabal to discuss options to see if there's anything else that you can recommend or do for her. Nonetheless obviously what we're trying to do is do what we can to save her leg and in turn prevent any additional worsening or damage. None in the meantime we been mainly trying to manage her ulcers as best we can. 07/27/18; some improvement in the multiplicity of wounds on her left lower extremity and foot. She's been using silver alginate. I was unable to determine that she actually has an appointment with Dr. Allyson Sabal. We are checking into this The patient's wound includes Left lateral foot, left plantar heel, her left anterior calf wound looks close to me, left fourth toe is very close to closed and the  left  medial calf is perhaps the largest open area READMISSION 02/19/2021 This is a now 62 year old woman with type 2 diabetes and continued cigarette smoking. She has known PAD. She was last in this clinic in 2019 at that point with wounds on her left foot. She left in a nonhealed state. On 06/28/2019 I see she had a left femoropopliteal by vein and vascular with an amputation of the fifth ray. These managed to heal over. Her last arterial studies were in February 2021. This showed an absent waveform at the posterior tibial artery on the right a WILLMA, OBANDO ([id number]) [id number] Page 3 of 14 dampened monophasic dorsalis pedis on the right of 0.44. On the left again the PTA TA was absent her dorsalis pedis was 0.99 triphasic and her great toe pressure was 0.65. This would have been after her revascularization. Once again she tells me that Dr. Allyson Sabal has done revascularization on the right leg on 3 different occasions. She has a right transmetatarsal amputation apparently done by Dr. Lajoyce Corners remotely but I do not see a note for these right lower extremity revascularization but I may not of looked back far enough. In any case she says that the she has a wound on the plantar aspect of the right transmetatarsal amputation site there is been there for several months. More recently she fell and had a wound on her right medial lower leg she had 5 sutures placed in the ER and then subsequently she has developed an area on the medial lower leg which was a blister that opened up. Past medical history includes type 2 diabetes, PAD, chronic renal failure, congestive heart failure, right transmetatarsal amputation, left fifth ray amputation, continued tobacco abuse, atrial fibrillation and cirrhosis. We did not attempt an ABI on the right leg today because of pain 02/26/2021; patient we admitted to the clinic last week. She has what looks to be 2 areas on her right medial and right anterior  lower leg that look more like venous wounds but she has 1 on the first met head at the base of her right transmet. She has known severe PAD. She complains of a lot of pain although some of this may be neuropathic. Is difficult to exclude a component of claudication. Use silver alginate on the wounds on her legs and Iodoflex on the area on the first met head. She has an appointment with Dr. Allyson Sabal on 5/25 although I will text him and discussed the situation. She is have poorly controlled diabetic. She has has stage IIIb chronic renal failure 4/22; patient presents for 1 week follow-up. The 2 areas on her right medial and right anterior lower leg appear well-healing. She has been using silver alginate to this area without issues. She has a first met head ulcer and it is unclear how long this has been there as it was discovered in the ED earlier this month when she was being evaluated for another issue. Iodoflex has been used at this area. 4/29; patient presents for 1 week follow-up. She has been using silver alginate to the leg wounds and Iodoflex to the plantar foot wound. She has had this wrapped with Coban and Kerlix. She has no concerns or complaints today. 5/6; this is a difficult wound on the right plantar foot transmit site. We are not making a lot of progress. She had 2 more venous looking wounds on the right medial and right anterior lower leg 1 of which is healed. Her appointment with Dr. Allyson Sabal  is on 5/25 5/16; she did not tolerate the offloading shoe we gave her in fact she had a fall with an abrasion on her right forearm she is now back in the modified small shoe which does not offload her foot properly. Her appoint with Dr. Allyson Sabal is still on 5/25 we have been using Iodoflex. The area on the right anterior lower leg is healed 7/18; patient presents for follow-up however has not been seen in 2 months. She was last seen at the end of May. She had a fall at the end of June and  was hospitalized. She has been unable to follow-up since then. For the wound she has only been keeping it covered with gauze. She is scheduled for an aortogram with lower extremity runoff at the end of July. 7/29; patient presents for 2-week follow-up. She has home health and they have been changing her dressings. She denies any issues and has no complaints today. She states she had a procedure where they opened up one of her vessels in her legs. She denies signs of infection. 8/5; patient presents for follow-up. She was recently in the hospital for bradycardia. She was noted to have cellulitis to the left lower extremity and Unna boot was placed due to blisters and increased swelling. She reports improvement to her left leg since being in the hospital and stability check her right plantar foot wound. She denies signs of infection. 8/23; since I last saw this patient a lot has happened. She still has the area over the right first met head in the setting of previous transmetatarsal amputation. Firstly most importantly she underwent revascularization of her occluded right SFA. She underwent directional atherectomy followed by drug-coated balloon angioplasty. She has no named vessel below the knee hopefully the revascularization of the right SFA will improve collateral flow. It is not felt however that she has any endovascular options. She had follow-up arterial studies noninvasive on 8/9. These showed an ABI of 0.44 at the right PTA. Monophasic waveforms. On the left her great toe ABI was 0.68. Her follow-up arterial Doppler showed a 50 to 74% stenosis in the proximal SFA and a 50 to 74% stenosis in the distal SFA mild to severe atherosclerosis noted throughout the extremity. Areas of shadowing plaque seen, unable to rule out higher grade stenosis she also had a fall apparently wearing a right foot forefoot off loader that we gave her. She therefore comes in in an ordinary running shoe today. Not  certain if this is the fall that ended up in the hospital with bradycardia 9/6 area over the right first metatarsal head in the setting of her previous amputation and severe PAD. She is also a diabetic with known PAD status post attempt at revascularization. She is continued smoker Again the separation of visits in the clinic is somewhat disturbing if we are going to consider her for a total contact cast. She is not wearing anything to offload this stating it causes imbalance especially the forefoot off loader. She basically comes in in bedroom slippers She also had a fall this morning about an hour ago. She has a skin tear on the left dorsal forearm 9/13; areas over the right first metatarsal head in the setting of previous TMA and severe PAD. Again she comes in here in slippers. We have been using Hydrofera Blue. We use MolecuLight on this which was essentially negative study 9/20; right first metatarsal head in the setting of her previous TMA and severe PAD. Same slipper  type shoes. She says she cannot wear a forefoot off loader and for some reason she will not wear surgical shoes. She says she is smoking half a pack per day. 9/30; right first metatarsal head. Absolutely no improvement in fact there is tunneling superiorly very close to bone. She does not offload this properly at all. We use MolecuLight on this 2 weeks ago that did not show any surface bacteria we have been using silver collagen is a dressing She tells me she is down to 3 cigarettes a day I have offered encouragement and a treatment plan 10/6; right first metatarsal head. Exposed bone this week which is not surprising. We have been using silver collagen. She is smoking 5 to 10 cigarettes a day 10/17; she has not been here in 10 days. The bone scraping that I did on 10/6 showed staph lugdunensis, staph epidermidis and Pseudomonas Alcalifenes. I put in for Septra DS 1 p.o. twice daily for 14 days last week would be but we could not  reach her to start the antibiotic. Quinolones would have been a good alternative except she has multiple drug interactions. Septra would not cover what ever the Pseudomonas is but I am not really sure of the relevance here. I am going to try her on the Septra for 2 weeks. She has a probing wound on the right TMA that probes to bone. She claims to have just about stop smoking I reviewed her arterial status. She underwent a left fem-tib bypass by Dr. Arbie Cookey in 06/28/2019. Her last angiogram was in July of this year by Dr. Allyson Sabal. This showed an occluded right SFA the popliteal and tibial vessels were also occluded the peroneal vessel filled by collaterals and filled the PT and DP by communicating collaterals. She was not felt to have any additional and vascular options. Dr. Allyson Sabal noted that she he had previously revascularized her right SFA 3 times. 10/25; she is tolerating the Septra but says it makes her nauseated. I am going to continue this for an additional 2 weeks perhaps for a total of 6 weeks. Her wound does not look too much different. There is on the right first met metatarsal head. There is probing areas around the tissue that is in the middle of the wound. There is no purulence I cannot feel bone. The original source was for a bone scraping. 11/8; right first metatarsal head. This does not probe to bone and there is no purulence. As far as I can tell she is completed the Septra although there were problems with exact length of time and I think compliance. In any case I gave her 6 weeks. I have reviewed her PAD above. 11/15; right first metatarsal head. Again protruding subcutaneous tissue but this does not have any adherence to surrounding skin. Undermining circumferentially. There is no palpable bone. Not grossly purulent. We have been using Iodoflex to try to get some adherence to clean off the surface but no real improvement. I do not think they actually had enough supplies listening to the  patient. She completed the 6 weeks of Septra I gave her, but I am not sure about the compliance with the dates i.e. may have missed days taken 1 a day etc. The patient has a vascular follow-up with Dr. Allyson Sabal this coming Friday i.e. in 3 days. By my read of arterial evaluations I do not think she is felt to be a candidate for any further revascularization. The last angiogram was in July. Right SFA is  occluded she has severe tibial vessel disease. KEYDI, GIEL ([id number]) [phone].pdf Page 4 of 14 She continues to smoke now up to 10 cigarettes a day. She is not in any pain. Wound has not improved but is certainly not worsened. I gave her 6 weeks of trimethoprim/sulfamethoxazole which she is completed in some fashion. 11/29 right first metatarsal head previous TMA. Wound actually looks somewhat better today still a lot of callus around the wound on debris on the wound surface. UNFORTUNATELY the patient missed her appointment with Dr. Allyson Sabal and is not rebooked apparently until sometime in February 12/13; right first metatarsal wound actually looks some smaller today. I did not debride this today. She is using Iodoflex. When she came in last week she had a large abscess on her left anterior lower leg apparently after falling we send her to the ER to have this drained which was done. I cannot see any cultures x-ray was negative. She was apparently given 2 doses of IV antibiotics and discharged on Bactrim DS twice daily which she is still taking. As mentioned I cannot see any cultures. 12/29; culture of the abscess on the left leg I did last time showed Serratia. We gave her antibiotics. I note she was in the ER next day with bleeding which they controlled she is on anticoagulants.She comes in today to clinic and the area on her right plantar foot is miraculously very hyperkeratotic but healed 02/06/2023 Ms. Abbygale Lapid is a 62 year old female with a past medical history of  uncontrolled type 2 diabetes with TMA to the right foot and fifth ray amputation to left foot. She reports a callus to the right first met head and on the lateral aspect of the left foot. She saw podiatry 3 days ago and had the callus debrided. There are no open wounds at this time. She follows up with podiatry in 2 weeks. Readmission: 04-19-2023 upon evaluation today patient appears to be doing well all things considered in regard to her wounds. She does have several wounds however on her feet 1 on the left foot centrally along the lateral edge and then a wound on the right lower leg more posterior. On the right foot plantar distal at the transmetatarsal amputation site this is the worst. With that being said she tells me this has been going on for quite some time she has been seen by podiatry they have done some debridements in the past. With that being said based on what we are seeing I believe that the patient's primary issue on the right side is that she is having some issues here with her arterial flow. The ABI on the right is 0.57 on the left is 1.04. With that being said this reading on the right is getting necessitated further evaluation by vascular. She tells me she has been seen by Dr. Gery Pray in the past and he stated there was nothing further to be done for the right leg. With that being said I think that it would be good to have a second opinion with vascular on this. Patient does have a history of diabetes mellitus type 2, peripheral vascular disease, and hypertension. The previous ABIs were performed on January 11, 2023. 04-26-2023 upon evaluation today patient appears to be doing about the same in regard to her wounds. She still has not had the arterial study at this point. I think this is something needs to be done as quickly as possible. Fortunately I do not see any evidence of active  infection locally or systemically which is great news. No fevers, chills, nausea, vomiting, or  diarrhea. 7/24; the patient has not been here in about a month and a half. She has open wounds on the plantar right foot second metatarsal head in the setting of a previous TMA, she has a new area on the left lateral foot and perhaps traumatic areas on the right anterior and left lateral lower legs. She has poorly controlled edema. The patient has known severe PAD has had multiple interventions in the right SFA. 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 She had an pressure of 60 at the dorsalis pedis giving her an ABI of 0.57 monophasic waveforms here. On the left her study was a lot better with an ABI 1.04 triphasic waveforms. The ABI might be falsely elevated on the left. I think at her last angiogram she had occluded right SFA popliteal and tibial vessels with the peroneal vessel being the only vessel feeding her foot via collaterals. She comes in this time in basic over-the-counter shoes 06-14-2023 upon evaluation today patient appears to be doing well currently in regard to her wounds all things considered except for the left lateral foot where she has what appears to be a blister this is erythematous as well and concerned about infection. I discussed with her today that I do think we need to see about doing the Vi core culture and subsequently depending on the results of this we will initiate antibiotic treatment as necessary. With that being said right now I think that she is going to need to be aggressive and elevating her legs she has an appointment with vascular coming up around 3 August is what I believe her appointment date is. Nonetheless once we get clearance from the standpoint of vascular she does have good blood flow and we can subsequently try to see what we do about getting her edema under much better control and then she really needs some much stronger compression than what  we currently are able to do. 06-21-2023 upon evaluation today patient appears to be doing pretty well currently in regard to her wounds most of which seem to be showing signs of improvement. She has been require little bit of debridement in regard to the right plantar foot wound I will do this as carefully as possible as we are still working on establishing that she has sufficient blood flow at this point. This right side on previous testing seem to be somewhat questionable. Fortunately I do not see any signs of active infection locally nor systemically at this time. 06-28-2023 patient does have an appointment with vascular on 07-11-2023. With that being said right now she has been having some slow progress but still nonetheless good progress at this point. I am actually very pleased with where we stand. I do not see any signs of active infection locally or systemically which is great news. No fevers, chills, nausea, vomiting, or diarrhea. 07-05-2023 upon evaluation today patient appears to be doing well currently in regard to her wound. She has been tolerating the dressing changes. She only has 1 wound left on the plantar aspect of the right foot. With that being said she does have a peg assist offloading shoe she has been using at this time. 8/28; patient with a wound over the plantar right foot metatarsal head. She was seen by Dr. Lenell Antu of vascular  surgery. Noted that the right ABI was only 0.57 it was felt that she had progression versus a previous study. She had some stenosis in the SFA a 7599% stenosis in the proximal mid SFA mild stenosis in the distal SFA and mild stenosis in the proximal PTA. On the left she had a patent left femoral to ATA bypass graft with mild stenosis It was recommended that she undergo angiography for limb salvage on the right. That is being arranged as we speak. Her wound is on the right first metatarsal head she has been using collagen as the primary dressing. She tells me  she is making every effort to stop smoking 07-19-2023 upon evaluation today patient appears to be doing okay in regard to her foot ulcer although her arterial procedure/revascularization keeps getting pushed back. She states that they are talking about pushing back and left nail due to the fact that she changed insurances and I do not have a being "updated insurance card number". With that being said I explained to the patient that should be something that she should be able to get on line easily by making an account. She is also called [facility] [facility] and they said they were sending it but it would be about a week before described by next Wednesday. She really does not have time to be holding on this she needs to be proceeding with intervention as quickly as possible. 07-26-2023 upon evaluation today patient appears to be doing well currently in regard to her wound. This is actually showing signs of looking about the same I did clear a lot of callus up last week this actually looks better to me from an overall visual standpoint than last week though it is a little larger because of the callus I had removed. With that being said I feel like she is actually making really good progress here towards complete closure. No fevers, chills, nausea, vomiting, or diarrhea. 08-09-2023 upon evaluation today patient presents for follow-up concerning her ongoing issues with her right plantar foot ulcer. She is going to be having arteriogram on Friday that is just just 2 days and hopefully following this it will actually allow for more aggressive healing in regard to the right plantar foot. Today I am going to perform some debridement to clear away some of the necrotic debris. 08-23-2023 upon evaluation patient's wound really appears to be doing about the same. She did have her arteriogram with Dr. Lenell Antu and that was on 9-27- 2024. Unfortunately it was noted that the patient had no good options for further  revascularization. They plan to reevaluate there in a month but she is stated to be at "high risk for major amputation". With that being said it does appear they were able to do stenting of the right femoral-popliteal artery but unfortunately CLAY, MENSER ([id number]) [id number] Page 5 of 14 they were unable to cross the TP trunk and proximal peroneal occlusion. Subsequently this means that there is really not good blood flow into the ankle and foot which means that the patient really does not have a good chance of getting this to heal. 08-30-2023 upon evaluation today patient appears to be doing well currently in regard to her wound. She has been tolerating the dressing changes without complication and in general I do feel that the patient is doing quite well at this time. Unfortunately with the blood flow being so low and really no real vascular options to improve blood flow she really has to try to  stay off of this is much as possible. 09-06-2023 upon evaluation today patient appears to be doing well currently in regard to the wound on her plantar foot it does not seem to be any worse is not terribly better but again there is really not much we can do from a blood flow standpoint at this time unfortunately. There does not appear to be any signs of active infection locally or systemically which is good news. She does have a wound on the posterior ankle location and the right leg as well. She also has an area on the left lateral foot. 09-13-2023 upon evaluation today patient appears to be doing decently well all things considered in regard to her wounds. Fortunately I do not see any signs of worsening overall I do believe that the patient is making good headway towards closure which is good news. No fevers, chills, nausea, vomiting, or diarrhea. Electronic Signature(s) Signed: 09/13/2023 1:18:05 PM By: Allen Derry PA-C Entered By: Allen Derry on 09/13/2023  10:18:05 -------------------------------------------------------------------------------- Physical Exam Details Patient Name: Date of Service: HA [name], [name] 09/13/2023 12:30 PM Medical Record Number: [id number] Patient Account Number: [id number] Date of Birth/Sex: Treating RN: 1960/11/30 (62 y.o. F) Primary Care Provider: [name] Other Clinician: Referring Provider: Treating Provider/Extender: [name], [name] Weeks in Treatment: 21 Constitutional Well-nourished and well-hydrated in no acute distress. Respiratory normal breathing without difficulty. Psychiatric this patient is able to make decisions and demonstrates good insight into disease process. Alert and Oriented x 3. pleasant and cooperative. Notes Upon evaluation patient's wound bed actually did not require sharp debridement regard to the plantar foot she tolerated that today without complication and postdebridement wound bed appears to be doing much better which is great news. No fevers, chills, nausea, vomiting, or diarrhea. Electronic Signature(s) Signed: 09/13/2023 1:18:42 PM By: Allen Derry PA-C Entered By: Allen Derry on 09/13/2023 10:18:42 -------------------------------------------------------------------------------- Physician Orders Details Patient Name: Date of Service: Karla Bigness, [name] 09/13/2023 12:30 PM Medical Record Number: [id number] Patient Account Number: [id number] Date of Birth/Sex: Treating RN: 12/06/1960 (62 y.o. [name] Primary Care Provider: [name] Other Clinician: Referring Provider: Treating Provider/Extender: [name], [name] Weeks in Treatment: 21 The following information was scribed by: Shawn Stall The information was scribed for: Lenda Kelp Verbal / Phone Orders: No Diagnosis Coding ICD-10 Coding ARVILLA, SALADA ([id number]) [id number] Page 6 of 14 Code Description 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 2 diabetes  mellitus 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 L97.522 Non-pressure chronic ulcer of other part of left foot with fat layer exposed E11.622 Type 2 diabetes mellitus with other skin ulcer L97.812 Non-pressure chronic ulcer of other part of right lower leg with fat layer exposed I73.89 Other specified peripheral vascular diseases I87.331 Chronic venous hypertension (idiopathic) with ulcer and inflammation of right lower extremity Follow-up Appointments ppointment in 1 week. Allen Derry PA 09/20/2023 1230pm Return A ppointment in 2 weeks. Allen Derry PA Wednesday Return A Return appointment in 3 weeks. Allen Derry PA Wednesday please schedule Return appointment in 1 month. Allen Derry PA Wednesday please schedule Anesthetic (In clinic) Topical Lidocaine 5% applied to wound bed Bathing/ Shower/ Hygiene May shower with protection but do not get wound dressing(s) wet. Protect dressing(s) with water repellant cover (for example, large plastic bag) or a cast cover and may then take shower. Edema Control - Orders / Instructions Elevate legs to the level  of the heart or above for 30 minutes daily and/or when sitting for 3-4 times a day throughout the day. Avoid standing for long periods of time. Off-Loading Open toe surgical shoe to: - with Peg Assist in shoe. Limit walking on foot. Stay off the foot to minimize pressure to bottom of foot. Additional Orders / Instructions Stop/Decrease Smoking Follow Nutritious Diet Non Wound Condition Left Lower Extremity pply the following to affected area as directed: - left foot- apply betadine to affected area cover with ABD pad, secure with kerlix tubigrip size E. A Home Health No change in wound care orders this week; continue Home Health for wound care. May utilize formulary equivalent dressing for wound treatment orders unless otherwise specified. - home health twice a week. Wound  Center weekly. primary dressing iodoflex or iodosorb to all wounds. Other Home Health Orders/Instructions: - [facility] *****Physical Therapy- non-weight bearing with right foot. Wound Treatment Wound #23 - Metatarsal head first Wound Laterality: Right Cleanser: Soap and Water 3 x Per Week/30 Days Discharge Instructions: May shower and wash wound with dial antibacterial soap and water prior to dressing change. Cleanser: Wound Cleanser 3 x Per Week/30 Days Discharge Instructions: Cleanse the wound with wound cleanser prior to applying a clean dressing using gauze sponges, not tissue or cotton balls. Peri-Wound Care: Sween Lotion (Moisturizing lotion) 3 x Per Week/30 Days Discharge Instructions: Apply moisturizing lotion as directed Prim Dressing: IODOFLEX 0.9% Cadexomer Iodine Pad 4x6 cm 3 x Per Week/30 Days ary Discharge Instructions: *****OR IODOSORB****Apply to wound bed as instructed Secondary Dressing: ABD Pad, 8x10 3 x Per Week/30 Days Discharge Instructions: Apply over primary dressing as directed. Secured With: [employer], 4.5x3.1 (in/yd) 3 x Per Week/30 Days Discharge Instructions: Secure with Kerlix as directed. Secured With: 27M Medipore H Soft Cloth Surgical T ape, 4 x 10 (in/yd) 3 x Per Week/30 Days Discharge Instructions: Secure with tape as directed. Compression Wrap: tubigrip size E single layer 3 x Per Week/30 Days Discharge Instructions: apply in the morning and remove at night. Wound #29 - Lower Leg Wound Laterality: Right, Anterior Cleanser: Soap and Water 3 x Per Week/30 Days Discharge Instructions: May shower and wash wound with dial antibacterial soap and water prior to dressing change. Cleanser: Wound Cleanser 3 x Per Week/30 Days Discharge Instructions: Cleanse the wound with wound cleanser prior to applying a clean dressing using gauze sponges, not tissue or cotton balls. ALICYA, BENA ([id number]) [phone].pdf Page 7 of  14 Peri-Wound Care: Sween Lotion (Moisturizing lotion) 3 x Per Week/30 Days Discharge Instructions: Apply moisturizing lotion as directed Prim Dressing: IODOFLEX 0.9% Cadexomer Iodine Pad 4x6 cm 3 x Per Week/30 Days ary Discharge Instructions: *****OR IODOSORB****Apply to wound bed as instructed Secondary Dressing: ABD Pad, 8x10 3 x Per Week/30 Days Discharge Instructions: Apply over primary dressing as directed. Secured With: [employer], 4.5x3.1 (in/yd) 3 x Per Week/30 Days Discharge Instructions: Secure with Kerlix as directed. Secured With: 27M Medipore H Soft Cloth Surgical T ape, 4 x 10 (in/yd) 3 x Per Week/30 Days Discharge Instructions: Secure with tape as directed. Compression Wrap: tubigrip size E single layer 3 x Per Week/30 Days Discharge Instructions: apply in the morning and remove at night. Wound #30 - Lower Leg Wound Laterality: Right, Posterior, Distal Cleanser: Soap and Water 3 x Per Week/30 Days Discharge Instructions: May shower and wash wound with dial antibacterial soap and water prior to dressing change. Cleanser: Wound Cleanser 3 x Per Week/30 Days Discharge Instructions: Cleanse  the wound with wound cleanser prior to applying a clean dressing using gauze sponges, not tissue or cotton balls. Peri-Wound Care: Sween Lotion (Moisturizing lotion) 3 x Per Week/30 Days Discharge Instructions: Apply moisturizing lotion as directed Prim Dressing: IODOFLEX 0.9% Cadexomer Iodine Pad 4x6 cm 3 x Per Week/30 Days ary Discharge Instructions: *****OR IODOSORB****Apply to wound bed as instructed Secondary Dressing: ABD Pad, 8x10 3 x Per Week/30 Days Discharge Instructions: Apply over primary dressing as directed. Secured With: [employer], 4.5x3.1 (in/yd) 3 x Per Week/30 Days Discharge Instructions: Secure with Kerlix as directed. Secured With: 68M Medipore H Soft Cloth Surgical T ape, 4 x 10 (in/yd) 3 x Per Week/30 Days Discharge Instructions: Secure with tape as  directed. Compression Wrap: tubigrip size E single layer 3 x Per Week/30 Days Discharge Instructions: apply in the morning and remove at night. Electronic Signature(s) Signed: 09/13/2023 4:33:40 PM By: Allen Derry PA-C Signed: 09/14/2023 6:03:32 PM By: Shawn Stall RN, BSN Entered By: Shawn Stall on 09/13/2023 10:12:15 -------------------------------------------------------------------------------- Problem List Details Patient Name: Date of Service: Karla Bigness, [name] 09/13/2023 12:30 PM Medical Record Number: [id number] Patient Account Number: [id number] Date of Birth/Sex: Treating RN: Sep 12, 1961 (61 y.o. F) Primary Care Provider: [name] Other Clinician: Referring Provider: Treating Provider/Extender: [name], [name] Weeks in Treatment: 21 Active Problems ICD-10 Encounter Code Description Active Date MDM Diagnosis 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 2 diabetes mellitus with foot ulcer 04/19/2023 No Yes AMILLIANA, HAYWORTH ([id number]) [phone].pdf Page 8 of 14 [id number] Non-pressure chronic ulcer of other part of right foot with fat layer exposed 04/19/2023 No Yes L97.522 Non-pressure chronic ulcer of other part of left foot with fat layer exposed 04/19/2023 No Yes E11.622 Type 2 diabetes mellitus with other skin ulcer 04/19/2023 No Yes L97.812 Non-pressure chronic ulcer of other part of right lower leg with fat layer 04/19/2023 No Yes exposed I73.89 Other specified peripheral vascular diseases 04/19/2023 No Yes I87.331 Chronic venous hypertension (idiopathic) with ulcer and inflammation of right 04/19/2023 No Yes lower extremity Inactive Problems Resolved Problems Electronic Signature(s) Signed: 09/13/2023 12:55:38 PM By: Allen Derry PA-C Entered By: Allen Derry on 09/13/2023 09:55:38 -------------------------------------------------------------------------------- Progress Note Details Patient Name: Date of Service: HA [name], [name] 09/13/2023 12:30 PM Medical  Record Number: [id number] Patient Account Number: [id number] Date of Birth/Sex: Treating RN: 1961/04/27 (62 y.o. F) Primary Care Provider: [name] Other Clinician: Referring Provider: Treating Provider/Extender: [name], [name] Weeks in Treatment: 21 Subjective Chief Complaint Information obtained from Patient Bilateral LE Ulcers History of Present Illness (HPI) This 62 year old patient who has a very long significant history of diabetes mellitus, previous alcohol and nicotine abuse, chronic data disease, COPD, diabetes mellitus, height hypertension, critical lower limb ischemia with several wounds being managed at the wound Center at [facility] for over a year. She was recently in the ER at [facility] and was referred to our center. He has had a long history of critical limb ischemia and over a period of time has had balloon angioplasties in March 2016, endarterectomy of the left carotid, lower extremity angiogram and treatment by Dr. Nanetta Batty, several cardiac catheterizations. Most recently she had an x-ray of her left foot while in the ER which showed no acute abnormality. during this ER visit she was started on ciprofloxacin and asked to continue with the wound care physicians at [facility]. Her last ABI done in June 2017 showed the right side to be 0.28 in the left side to be 0.48.  Her right toe brachial index was 0.17 on the right and 0.27 on the left. her last hemoglobin A1c was 11.1% She continues to smoke about a pack of cigarettes a day. 03/28/2017 -- -- right foot x-ray -- IMPRESSION:Areas of soft tissue swelling. Mild subluxation second PIP joint. No frank dislocation or fracture. No erosive change or bony destruction. No soft tissue ulceration or radiopaque foreign body evident by radiography. There is plantar fascia calcification with a nearby inferior calcaneal spur. There are foci of arterial vascular calcification. 04/04/2017 -- the  patient continues to be noncompliant and continues to complain of a lot of pain and has not done anything about quitting smoking CALEESI, KOHL ([id number]) [phone].pdf Page 9 of 14 Readmission: 06/26/18 on evaluation today patient presents for reevaluation she has not been seen in our office since May 2018. Since she was last seen here in our office she has undergone testing at Dr. Durenda Age office where it was revealed that she had an ABI of 0.68 with her previous ABI being 0.64 and a left ABI of 0.38 with previous ABI being 0.34 this study which was performed on 04/05/18 was pretty much equivalent to the study performed on 11/22/17. The findings in the end revealed on the right would appear to be moderate right lower extremity arterial disease on the left Dr. Allyson Sabal states moderate in the report but unfortunately this appears to be much more severe at 0.38 compared to the right. Subsequently the patient was scheduled to have angiography with Dr. Allyson Sabal on 04/12/18. This was however canceled due to the fact that the patient was found to have chronic kidney disease stage III and it was to the point that he did not feel that it will be safe to pursue angiography at that point. She has not been on any antibiotics recently. At this point Dr. Allyson Sabal has not rescheduled anything as far as the injured [place] according to the patient he is somewhat reluctant to do so. Nonetheless with her diminished blood flow this is gonna make it somewhat difficult for her to heal. 07/05/18; this is a patient I have not seen previously. She has very significant PAD as noted above. She apparently has had revascularization efforts by Dr. Allyson Sabal on the right on 3 occasions to the patient. She was supposed to have an attempted angiography on the left however this was canceled apparently because of stage III chronic renal failure. I will need to research all of this. She complains of significant pain in  the wound and has claudication enough that when she walks to the end of the driveway she has to stop. She is been using silver alginate to the wounds on her legs and Iodoflex to the area on her left second toe. 07/13/18 on evaluation today patient's wounds actually appear to be doing about the same. She has an appointment she tells me within the next month that is September 2019 with Dr. Allyson Sabal to discuss options to see if there's anything else that you can recommend or do for her. Nonetheless obviously what we're trying to do is do what we can to save her leg and in turn prevent any additional worsening or damage. None in the meantime we been mainly trying to manage her ulcers as best we can. 07/27/18; some improvement in the multiplicity of wounds on her left lower extremity and foot. She's been using silver alginate. I was unable to determine that she actually has an appointment with Dr. Allyson Sabal. We are checking  into this The patient's wound includes Left lateral foot, left plantar heel, her left anterior calf wound looks close to me, left fourth toe is very close to closed and the left medial calf is perhaps the largest open area READMISSION 02/19/2021 This is a now 62 year old woman with type 2 diabetes and continued cigarette smoking. She has known PAD. She was last in this clinic in 2019 at that point with wounds on her left foot. She left in a nonhealed state. On 06/28/2019 I see she had a left femoropopliteal by vein and vascular with an amputation of the fifth ray. These managed to heal over. Her last arterial studies were in February 2021. This showed an absent waveform at the posterior tibial artery on the right a dampened monophasic dorsalis pedis on the right of 0.44. On the left again the PTA TA was absent her dorsalis pedis was 0.99 triphasic and her great toe pressure was 0.65. This would have been after her revascularization. Once again she tells me that Dr. Allyson Sabal has done  revascularization on the right leg on 3 different occasions. She has a right transmetatarsal amputation apparently done by Dr. Lajoyce Corners remotely but I do not see a note for these right lower extremity revascularization but I may not of looked back far enough. In any case she says that the she has a wound on the plantar aspect of the right transmetatarsal amputation site there is been there for several months. More recently she fell and had a wound on her right medial lower leg she had 5 sutures placed in the ER and then subsequently she has developed an area on the medial lower leg which was a blister that opened up. Past medical history includes type 2 diabetes, PAD, chronic renal failure, congestive heart failure, right transmetatarsal amputation, left fifth ray amputation, continued tobacco abuse, atrial fibrillation and cirrhosis. We did not attempt an ABI on the right leg today because of pain 02/26/2021; patient we admitted to the clinic last week. She has what looks to be 2 areas on her right medial and right anterior lower leg that look more like venous wounds but she has 1 on the first met head at the base of her right transmet. She has known severe PAD. She complains of a lot of pain although some of this may be neuropathic. Is difficult to exclude a component of claudication. Use silver alginate on the wounds on her legs and Iodoflex on the area on the first met head. She has an appointment with Dr. Allyson Sabal on 5/25 although I will text him and discussed the situation. She is have poorly controlled diabetic. She has has stage IIIb chronic renal failure 4/22; patient presents for 1 week follow-up. The 2 areas on her right medial and right anterior lower leg appear well-healing. She has been using silver alginate to this area without issues. She has a first met head ulcer and it is unclear how long this has been there as it was discovered in the ED earlier this month when she was being  evaluated for another issue. Iodoflex has been used at this area. 4/29; patient presents for 1 week follow-up. She has been using silver alginate to the leg wounds and Iodoflex to the plantar foot wound. She has had this wrapped with Coban and Kerlix. She has no concerns or complaints today. 5/6; this is a difficult wound on the right plantar foot transmit site. We are not making a lot of progress. She had 2 more  venous looking wounds on the right medial and right anterior lower leg 1 of which is healed. Her appointment with Dr. Allyson Sabal is on 5/25 5/16; she did not tolerate the offloading shoe we gave her in fact she had a fall with an abrasion on her right forearm she is now back in the modified small shoe which does not offload her foot properly. Her appoint with Dr. Allyson Sabal is still on 5/25 we have been using Iodoflex. The area on the right anterior lower leg is healed 7/18; patient presents for follow-up however has not been seen in 2 months. She was last seen at the end of May. She had a fall at the end of June and was hospitalized. She has been unable to follow-up since then. For the wound she has only been keeping it covered with gauze. She is scheduled for an aortogram with lower extremity runoff at the end of July. 7/29; patient presents for 2-week follow-up. She has home health and they have been changing her dressings. She denies any issues and has no complaints today. She states she had a procedure where they opened up one of her vessels in her legs. She denies signs of infection. 8/5; patient presents for follow-up. She was recently in the hospital for bradycardia. She was noted to have cellulitis to the left lower extremity and Unna boot was placed due to blisters and increased swelling. She reports improvement to her left leg since being in the hospital and stability check her right plantar foot wound. She denies signs of infection. 8/23; since I last saw this patient a lot has happened.  She still has the area over the right first met head in the setting of previous transmetatarsal amputation. Firstly most importantly she underwent revascularization of her occluded right SFA. She underwent directional atherectomy followed by drug-coated balloon angioplasty. She has no named vessel below the knee hopefully the revascularization of the right SFA will improve collateral flow. It is not felt however that she has any endovascular options. She had follow-up arterial studies noninvasive on 8/9. These showed an ABI of 0.44 at the right PTA. Monophasic waveforms. On the left her great toe ABI was 0.68. Her follow-up arterial Doppler showed a 50 to 74% stenosis in the proximal SFA and a 50 to 74% stenosis in the distal SFA mild to severe atherosclerosis noted throughout the extremity. Areas of shadowing plaque seen, unable to rule out higher grade stenosis she also had a fall apparently wearing a right foot forefoot off loader that we gave her. She therefore comes in in an ordinary running shoe today. Not certain if this is the fall that ended up in the hospital with bradycardia 9/6 area over the right first metatarsal head in the setting of her previous amputation and severe PAD. She is also a diabetic with known PAD status post attempt at revascularization. She is continued smoker Again the separation of visits in the clinic is somewhat disturbing if we are going to consider her for a total contact cast. She is not wearing anything to offload this stating it causes imbalance especially the forefoot off loader. She basically comes in in bedroom slippers She also had a fall this morning about an hour ago. She has a skin tear on the left dorsal forearm KLOEE, BALLEW ([id number]) [id number] Page 10 of 14 9/13; areas over the right first metatarsal head in the setting of previous TMA and severe PAD. Again she comes in here in slippers. We have been  using Hydrofera  Blue. We use MolecuLight on this which was essentially negative study 9/20; right first metatarsal head in the setting of her previous TMA and severe PAD. Same slipper type shoes. She says she cannot wear a forefoot off loader and for some reason she will not wear surgical shoes. She says she is smoking half a pack per day. 9/30; right first metatarsal head. Absolutely no improvement in fact there is tunneling superiorly very close to bone. She does not offload this properly at all. We use MolecuLight on this 2 weeks ago that did not show any surface bacteria we have been using silver collagen is a dressing She tells me she is down to 3 cigarettes a day I have offered encouragement and a treatment plan 10/6; right first metatarsal head. Exposed bone this week which is not surprising. We have been using silver collagen. She is smoking 5 to 10 cigarettes a day 10/17; she has not been here in 10 days. The bone scraping that I did on 10/6 showed staph lugdunensis, staph epidermidis and Pseudomonas Alcalifenes. I put in for Septra DS 1 p.o. twice daily for 14 days last week would be but we could not reach her to start the antibiotic. Quinolones would have been a good alternative except she has multiple drug interactions. Septra would not cover what ever the Pseudomonas is but I am not really sure of the relevance here. I am going to try her on the Septra for 2 weeks. She has a probing wound on the right TMA that probes to bone. She claims to have just about stop smoking I reviewed her arterial status. She underwent a left fem-tib bypass by Dr. Arbie Cookey in 06/28/2019. Her last angiogram was in July of this year by Dr. Allyson Sabal. This showed an occluded right SFA the popliteal and tibial vessels were also occluded the peroneal vessel filled by collaterals and filled the PT and DP by communicating collaterals. She was not felt to have any additional and vascular options. Dr. Allyson Sabal noted that she he had previously  revascularized her right SFA 3 times. 10/25; she is tolerating the Septra but says it makes her nauseated. I am going to continue this for an additional 2 weeks perhaps for a total of 6 weeks. Her wound does not look too much different. There is on the right first met metatarsal head. There is probing areas around the tissue that is in the middle of the wound. There is no purulence I cannot feel bone. The original source was for a bone scraping. 11/8; right first metatarsal head. This does not probe to bone and there is no purulence. As far as I can tell she is completed the Septra although there were problems with exact length of time and I think compliance. In any case I gave her 6 weeks. I have reviewed her PAD above. 11/15; right first metatarsal head. Again protruding subcutaneous tissue but this does not have any adherence to surrounding skin. Undermining circumferentially. There is no palpable bone. Not grossly purulent. We have been using Iodoflex to try to get some adherence to clean off the surface but no real improvement. I do not think they actually had enough supplies listening to the patient. She completed the 6 weeks of Septra I gave her, but I am not sure about the compliance with the dates i.e. may have missed days taken 1 a day etc. The patient has a vascular follow-up with Dr. Allyson Sabal this coming Friday i.e. in 3 days.  By my read of arterial evaluations I do not think she is felt to be a candidate for any further revascularization. The last angiogram was in July. 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 She continues to smoke now up to 10 cigarettes a day. She is not in any pain. Wound has not improved but is certainly not worsened. I gave her 6 weeks of trimethoprim/sulfamethoxazole which she is completed in some fashion. 11/29 right first metatarsal head previous TMA. Wound actually looks somewhat better today still a lot of callus around the wound on debris on  the wound surface. UNFORTUNATELY the patient missed her appointment with Dr. Allyson Sabal and is not rebooked apparently until sometime in February 12/13; right first metatarsal wound actually looks some smaller today. I did not debride this today. She is using Iodoflex.  When she came in last week she had a large abscess on her left anterior lower leg apparently after falling we send her to the ER to have this drained which was done. I cannot see any cultures x-ray was negative. She was apparently given 2 doses of IV antibiotics and discharged on Bactrim DS twice daily which she is still taking. As mentioned I cannot see any cultures. 12/29; culture of the abscess on the left leg I did last time showed Serratia. We gave her antibiotics. I note she was in the ER next day with bleeding which they controlled she is on anticoagulants.She comes in today to clinic and the area on her right plantar foot is miraculously very hyperkeratotic but healed 02/06/2023 Ms. 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 She reports a callus to the right first met head and on the lateral aspect of the left foot. She saw podiatry 3 days ago and had the callus debrided. There are no open wounds at this time. She follows up with podiatry in 2 weeks. Readmission: 04-19-2023 upon evaluation today patient appears to be doing well all things considered in regard to her wounds. She does have several wounds however on her feet 1 on the left foot centrally along the lateral edge and then a wound on the right lower leg more posterior. On the right foot plantar distal at the transmetatarsal amputation site this is the worst. With that being said she tells me this has been going on for quite some time she has been seen by podiatry they have done some debridements in the past. With that being said based on what we are seeing I  believe that the patient's primary issue on the right side is that she is having some issues here with her arterial flow. The ABI on the right is 0.57 on the left is 1.04. With that being said this reading on the right is getting necessitated further evaluation by vascular. She tells me she has been seen by Dr. Gery Pray in the past and he stated there was nothing further to be done for the right leg. With that being said I think that it would be good to have a second opinion with vascular on this. Patient does have a history of diabetes mellitus type 2, peripheral vascular disease, and hypertension. The previous ABIs were performed on January 11, 2023. 04-26-2023 upon evaluation today patient appears to be doing about the same in regard to her wounds. She still has not had the arterial study at this  point. I think this is something needs to be done as quickly as possible. Fortunately I do not see any evidence of active infection locally or systemically which is great news. No fevers, chills, nausea, vomiting, or diarrhea. 7/24; the patient has not been here in about a month and a half. She has open wounds on the plantar right foot second metatarsal head in the setting of a previous TMA, she has a new area on the left lateral foot and perhaps traumatic areas on the right anterior and left lateral lower legs. She has poorly controlled edema. The patient has known severe PAD has had multiple interventions in the right SFA. 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 She had an pressure of 60 at the dorsalis pedis giving her an ABI of 0.57 monophasic waveforms here. On the left her study was a lot better with an ABI 1.04 triphasic waveforms. The ABI might be falsely elevated on the left. I think at her last angiogram she had occluded right SFA popliteal and tibial vessels with the peroneal vessel being  the only vessel feeding her foot via collaterals. She comes in this time in basic over-the-counter shoes 06-14-2023 upon evaluation today patient appears to be doing well currently in regard to her wounds all things considered except for the left lateral foot where she has what appears to be a blister this is erythematous as well and concerned about infection. I discussed with her today that I do think we need to see about doing the Vi core culture and subsequently depending on the results of this we will initiate antibiotic treatment as necessary. With that being said right now I think that she is going to need to be aggressive and elevating her legs she has an appointment with vascular coming up around 3 August is what I believe her appointment date is. Nonetheless once we get clearance from the standpoint of vascular she does have good blood flow and we can subsequently try to see what we do about getting her edema under much better control and then she really needs some much stronger compression than what we currently are able to do. 06-21-2023 upon evaluation today patient appears to be doing pretty well currently in regard to her wounds most of which seem to be showing signs of improvement. She has been require little bit of debridement in regard to the right plantar foot wound I will do this as carefully as possible as we are still working on establishing that she has sufficient blood flow at this point. This right side on previous testing seem to be somewhat questionable. Fortunately I do not see MYSTY, KIELTY ([id number]) [id number] Page 11 of 14 any signs of active infection locally nor systemically at this time. 06-28-2023 patient does have an appointment with vascular on 07-11-2023. With that being said right now she has been having some slow progress but still nonetheless good progress at this point. I am actually very pleased with where we stand. I do not see any  signs of active infection locally or systemically which is great news. No fevers, chills, nausea, vomiting, or diarrhea. 07-05-2023 upon evaluation today patient appears to be doing well currently in regard to her wound. She has been tolerating the dressing changes. She only has 1 wound left on the plantar aspect of the right foot. With that being said she does have  a peg assist offloading shoe she has been using at this time. 8/28; patient with a wound over the plantar right foot metatarsal head. She was seen by Dr. Lenell Antu of vascular surgery. Noted that the right ABI was only 0.57 it was felt that she had progression versus a previous study. She had some stenosis in the SFA a 7599% stenosis in the proximal mid SFA mild stenosis in the distal SFA and mild stenosis in the proximal PTA. On the left she had a patent left femoral to ATA bypass graft with mild stenosis It was recommended that she undergo angiography for limb salvage on the right. That is being arranged as we speak. Her wound is on the right first metatarsal head she has been using collagen as the primary dressing. She tells me she is making every effort to stop smoking 07-19-2023 upon evaluation today patient appears to be doing okay in regard to her foot ulcer although her arterial procedure/revascularization keeps getting pushed back. She states that they are talking about pushing back and left nail due to the fact that she changed insurances and I do not have a being "updated insurance card number". With that being said I explained to the patient that should be something that she should be able to get on line easily by making an account. She is also called [place] [facility] and they said they were sending it but it would be about a week before described by next Wednesday. She really does not have time to be holding on this she needs to be proceeding with intervention as quickly as possible. 07-26-2023 upon evaluation today patient appears  to be doing well currently in regard to her wound. This is actually showing signs of looking about the same I did clear a lot of callus up last week this actually looks better to me from an overall visual standpoint than last week though it is a little larger because of the callus I had removed. With that being said I feel like she is actually making really good progress here towards complete closure. No fevers, chills, nausea, vomiting, or diarrhea. 08-09-2023 upon evaluation today patient presents for follow-up concerning her ongoing issues with her right plantar foot ulcer. She is going to be having arteriogram on Friday that is just just 2 days and hopefully following this it will actually allow for more aggressive healing in regard to the right plantar foot. Today I am going to perform some debridement to clear away some of the necrotic debris. 08-23-2023 upon evaluation patient's wound really appears to be doing about the same. She did have her arteriogram with Dr. Lenell Antu and that was on 9-27- 2024. Unfortunately it was noted that the patient had no good options for further revascularization. They plan to reevaluate there in a month but she is stated to be at "high risk for major amputation". With that being said it does appear they were able to do stenting of the right femoral-popliteal artery but unfortunately they were unable to cross the TP trunk and proximal peroneal occlusion. Subsequently this means that there is really not good blood flow into the ankle and foot which means that the patient really does not have a good chance of getting this to heal. 08-30-2023 upon evaluation today patient appears to be doing well currently in regard to her wound. She has been tolerating the dressing changes without complication and in general I do feel that the patient is doing quite well at this time. Unfortunately with  the blood flow being so low and really no real vascular options to improve blood flow  she really has to try to stay off of this is much as possible. 09-06-2023 upon evaluation today patient appears to be doing well currently in regard to the wound on her plantar foot it does not seem to be any worse is not terribly better but again there is really not much we can do from a blood flow standpoint at this time unfortunately. There does not appear to be any signs of active infection locally or systemically which is good news. She does have a wound on the posterior ankle location and the right leg as well. She also has an area on the left lateral foot. 09-13-2023 upon evaluation today patient appears to be doing decently well all things considered in regard to her wounds. Fortunately I do not see any signs of worsening overall I do believe that the patient is making good headway towards closure which is good news. No fevers, chills, nausea, vomiting, or diarrhea. Objective Constitutional Well-nourished and well-hydrated in no acute distress. Vitals Time Taken: 12:50 PM, Height: 64 in, Weight: 225 lbs, BMI: 38.6, Temperature: 98.4 F, Pulse: 79 bpm, Respiratory Rate: 24 breaths/min, Blood Pressure: 153/81 mmHg. Respiratory normal breathing without difficulty. Psychiatric this patient is able to make decisions and demonstrates good insight into disease process. Alert and Oriented x 3. pleasant and cooperative. General Notes: Upon evaluation patient's wound bed actually did not require sharp debridement regard to the plantar foot she tolerated that today without complication and postdebridement wound bed appears to be doing much better which is great news. No fevers, chills, nausea, vomiting, or diarrhea. Integumentary (Hair, Skin) Wound #23 status is Open. Original cause of wound was Gradually Appeared. The date acquired was: 04/18/2021. The wound has been in treatment 21 weeks. The wound is located on the Right Metatarsal head first. The wound measures 1.1cm length x 0.7cm width x 0.2cm  depth; 0.605cm^2 area and 0.121cm^3 volume. There is Fat Layer (Subcutaneous Tissue) exposed. There is no tunneling or undermining noted. There is a medium amount of serosanguineous drainage noted. The wound margin is distinct with the outline attached to the wound base. There is large (67-100%) pink granulation within the wound bed. There is a small (1-33%) amount of necrotic tissue within the wound bed including Adherent Slough. The periwound skin appearance had no abnormalities noted for color. The periwound skin appearance exhibited: Callus, Maceration. The periwound skin appearance did not exhibit: Dry/Scaly. Periwound temperature was noted as No Abnormality. Wound #29 status is Open. Original cause of wound was Bump. The date acquired was: 08/20/2023. The wound has been in treatment 3 weeks. The wound is located on the Right,Anterior Lower Leg. The wound measures 1.4cm length x 1.8cm width x 0.3cm depth; 1.979cm^2 area and 0.594cm^3 volume. There is Fat Layer (Subcutaneous Tissue) exposed. There is no tunneling noted, however, there is undermining starting at 6:00 and ending at 8:00 with a maximum distance of 0.9cm. There is a medium amount of serosanguineous drainage noted. The wound margin is distinct with the outline attached to the wound base. There is large (67-100%) red granulation within the wound bed. There is a small (1-33%) amount of necrotic tissue within the wound bed including Adherent Slough. The periwound skin appearance exhibited: Hemosiderin Staining. The periwound skin appearance did not exhibit: Callus, Crepitus, Excoriation, Induration, Rash, ALIESE, BRANNUM ([id number]) [id number] Page 12 of 14 Scarring, Dry/Scaly, Maceration, Atrophie Blanche, Cyanosis, Ecchymosis, Mottled, Pallor, Rubor,  Erythema. Wound #30 status is Open. Original cause of wound was Gradually Appeared. The date acquired was: 09/06/2023. The wound has been in treatment 1  weeks. The wound is located on the Right,Distal,Posterior Lower Leg. The wound measures 0.5cm length x 0.6cm width x 0.1cm depth; 0.236cm^2 area and 0.024cm^3 volume. There is Fat Layer (Subcutaneous Tissue) exposed. There is no tunneling or undermining noted. There is a medium amount of serosanguineous drainage noted. The wound margin is distinct with the outline attached to the wound base. There is small (1-33%) red, pink granulation within the wound bed. There is a large (67-100%) amount of necrotic tissue within the wound bed including Adherent Slough. The periwound skin appearance exhibited: Hemosiderin Staining. The periwound skin appearance did not exhibit: Callus, Crepitus, Excoriation, Induration, Rash, Scarring, Dry/Scaly, Maceration, Atrophie Blanche, Cyanosis, Ecchymosis, Mottled, Pallor, Rubor, Erythema. Periwound temperature was noted as No Abnormality. 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 There was a Excisional Skin/Subcutaneous Tissue Debridement with a total area of 0.6 sq cm performed by Lenda Kelp, PA. With the following instrument(s): Curette to remove Viable and Non-Viable tissue/material. Material removed includes Callus, Subcutaneous Tissue, Slough, Skin: Dermis, and Skin: Epidermis after achieving pain  control using Lidocaine 4% Topical Solution. A time out was conducted at 13:00, prior to the start of the procedure. A Minimum amount of bleeding was controlled with Pressure. The procedure was tolerated well with a pain level of 0 throughout and a pain level of 0 following the procedure. Post Debridement Measurements: 1.1cm length x 0.7cm width x 0.2cm depth; 0.121cm^3 volume. Character of Wound/Ulcer Post Debridement is improved. Severity of Tissue Post Debridement is: Fat layer exposed. Post procedure Diagnosis Wound #23: Same as Pre-Procedure Plan Follow-up Appointments: Return Appointment in 1 week. Allen Derry PA 09/20/2023 1230pm Return Appointment in 2 weeks. Allen Derry PA Wednesday Return appointment in 3 weeks. Allen Derry PA Wednesday please schedule Return appointment in 1 month. Allen Derry PA Wednesday please schedule Anesthetic: (In clinic) Topical Lidocaine 5% applied to wound bed Bathing/ Shower/ Hygiene: May shower with protection but do not get wound dressing(s) wet. Protect dressing(s) with water repellant cover (for example, large plastic bag) or a cast cover and may then take shower. Edema Control - Orders / Instructions: Elevate legs to the level of the heart or above for 30 minutes daily and/or when sitting for 3-4 times a day throughout the day. Avoid standing for long periods of time. Off-Loading: Open toe surgical shoe to: - with Peg Assist in shoe. Limit walking on foot. Stay off the foot to minimize pressure to bottom of foot. Additional Orders / Instructions: Stop/Decrease Smoking Follow Nutritious Diet Non Wound Condition: Apply the following to affected area as directed: - left foot- apply betadine to affected area cover with ABD pad, secure with kerlix tubigrip size E. Home Health: No change in wound care orders this week;  continue Home Health for wound care. May utilize formulary equivalent dressing for wound treatment orders unless otherwise  specified. - home health twice a week. Wound Center weekly. primary dressing iodoflex or iodosorb to all wounds. Other Home Health Orders/Instructions: - [facility] *****Physical Therapy- non-weight bearing with right foot. WOUND #23: - Metatarsal head first Wound Laterality: Right Cleanser: Soap and Water 3 x Per Week/30 Days Discharge Instructions: May shower and wash wound with dial antibacterial soap and water prior to dressing change. Cleanser: Wound Cleanser 3 x Per Week/30 Days Discharge Instructions: Cleanse the wound with wound cleanser prior to applying a clean dressing using gauze sponges, not tissue or cotton balls. Peri-Wound Care: Sween Lotion (Moisturizing lotion) 3 x Per Week/30 Days Discharge Instructions: Apply moisturizing lotion as directed Prim Dressing: IODOFLEX 0.9% Cadexomer Iodine Pad 4x6 cm 3 x Per Week/30 Days ary Discharge Instructions: *****OR IODOSORB****Apply to wound bed as instructed Secondary Dressing: ABD Pad, 8x10 3 x Per Week/30 Days Discharge Instructions: Apply over primary dressing as directed. Secured With: [employer], 4.5x3.1 (in/yd) 3 x Per Week/30 Days Discharge Instructions: Secure with Kerlix as directed. Secured With: 74M Medipore H Soft Cloth Surgical T ape, 4 x 10 (in/yd) 3 x Per Week/30 Days CHINELO, BENN ([id number]) [phone].pdf Page 13 of 14 Discharge Instructions: Secure with tape as directed. Com pression Wrap: tubigrip size E single layer 3 x Per Week/30 Days Discharge Instructions: apply in the morning and remove at night. WOUND #29: - Lower Leg Wound Laterality: Right, Anterior Cleanser: Soap and Water 3 x Per Week/30 Days Discharge Instructions: May shower and wash wound with dial antibacterial soap and water prior to dressing change. Cleanser: Wound Cleanser 3 x Per Week/30 Days Discharge Instructions: Cleanse the wound with wound cleanser prior to applying a clean dressing using gauze  sponges, not tissue or cotton balls. Peri-Wound Care: Sween Lotion (Moisturizing lotion) 3 x Per Week/30 Days Discharge Instructions: Apply moisturizing lotion as directed Prim Dressing: IODOFLEX 0.9% Cadexomer Iodine Pad 4x6 cm 3 x Per Week/30 Days ary Discharge Instructions: *****OR IODOSORB****Apply to wound bed as instructed Secondary Dressing: ABD Pad, 8x10 3 x Per Week/30 Days Discharge Instructions: Apply over primary dressing as directed. Secured With: [employer], 4.5x3.1 (in/yd) 3 x Per Week/30 Days Discharge Instructions: Secure with Kerlix as directed. Secured With: 74M Medipore H Soft Cloth Surgical T ape, 4 x 10 (in/yd) 3 x Per Week/30 Days Discharge Instructions: Secure with tape as directed. Com pression Wrap: tubigrip size E single layer 3 x Per Week/30 Days Discharge Instructions: apply in the morning and remove at night. WOUND #30: - Lower Leg Wound Laterality: Right, Posterior, Distal Cleanser: Soap and Water 3 x Per Week/30 Days Discharge Instructions: May shower and wash wound with dial antibacterial soap and water prior to dressing change. Cleanser: Wound Cleanser 3 x Per Week/30 Days Discharge Instructions: Cleanse the wound with wound cleanser prior to applying a clean dressing using gauze sponges, not tissue or cotton balls. Peri-Wound Care: Sween Lotion (Moisturizing lotion) 3 x Per Week/30 Days Discharge Instructions: Apply moisturizing lotion as directed Prim Dressing: IODOFLEX 0.9% Cadexomer Iodine Pad 4x6 cm 3 x Per Week/30 Days ary Discharge Instructions: *****OR IODOSORB****Apply to wound bed as instructed Secondary Dressing: ABD Pad, 8x10 3 x Per Week/30 Days Discharge Instructions: Apply over primary dressing as directed. Secured With: [employer], 4.5x3.1 (in/yd) 3 x Per Week/30 Days Discharge Instructions: Secure with Kerlix as directed. Secured With: 74M Medipore H Scientist, research (life sciences) Surgical  T ape, 4 x 10 (in/yd) 3 x Per Week/30 Days Discharge  Instructions: Secure with tape as directed. Com pression Wrap: tubigrip size E single layer 3 x Per Week/30 Days Discharge Instructions: apply in the morning and remove at night. 1. Based on what I see I do believe that the patient should continue to monitor for any signs of infection or worsening at this point. I do believe that the patient is tolerating the dressing changes without complication recommend continue with the Iodoflex to both wounds on the right leg plantar foot and leg. 2. I am going to recommend that we continue with the Betadine to the lateral foot on the left side this is not open yet I am unsure whether there is really wound underneath or just a blood blister here. Will get a see how things look over the next few weeks as it starts to hopefully dry off and peel away. We will see patient back for reevaluation in 1 week here in the clinic. If anything worsens or changes patient will contact our office for additional recommendations. It does appear that the patient is telling me that the vascular may be potential early consider doing a femoral popliteal bypass. With that being said I am not certain if that is a shift from what they previously said which was essentially that there was nothing more they can do from a intervention standpoint. Again we will see where things stand when she follows up with them in 1 month's time in the meantime working to see what we can do from a healing perspective here at wound care. Electronic Signature(s) Signed: 09/13/2023 1:20:02 PM By: Allen Derry PA-C Entered By: Allen Derry on 09/13/2023 10:20:01 -------------------------------------------------------------------------------- SuperBill Details Patient Name: Date of Service: HA [name], [name] 09/13/2023 Medical Record Number: [id number] Patient Account Number: [id number] Date of Birth/Sex: Treating RN: April 15, 1961 (62 y.o. [name], Millard.Loa Primary Care Provider: [name] Other  Clinician: Referring Provider: Treating Provider/Extender: Juleen China Weeks in Treatment: 21 Diagnosis Coding ICD-10 Codes Code Description 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 L97.522 Non-pressure chronic ulcer of other part of left foot with fat layer exposed SAHAANA, WEITMAN ([id number]) [phone].pdf Page 14 of 14 E11.622 Type 2 diabetes mellitus with other skin ulcer L97.812 Non-pressure chronic ulcer of other part of right lower leg with fat layer exposed I73.89 Other specified peripheral vascular diseases I87.331 Chronic venous hypertension (idiopathic) with ulcer and inflammation of right lower extremity Facility Procedures : CPT4 Code: [id number] Description: 11042 - DEB SUBQ TISSUE 20 SQ CM/< ICD-10 Diagnosis Description L97.512 Non-pressure chronic ulcer of other part of right foot with fat layer exposed Modifier: Quantity: 1 Physician Procedures : CPT4 Code Description Modifier [id number] 11042 - WC PHYS SUBQ TISS 20 SQ CM ICD-10 Diagnosis Description L97.512 Non-pressure chronic ulcer of other part of right foot with fat layer exposed Quantity: 1 Electronic Signature(s) Signed: 09/13/2023 1:20:15 PM By: Allen Derry PA-C Entered By: Allen Derry on 09/13/2023 10:20:14

## 2023-09-14 ENCOUNTER — Telehealth (HOSPITAL_COMMUNITY): Payer: Self-pay

## 2023-09-14 NOTE — Telephone Encounter (Signed)
Contacted [facility] to request the following for refills:  Vascepa Wellbutrin    Kerry Hough, Paramedic [phone] 09/14/23

## 2023-09-14 NOTE — Telephone Encounter (Signed)
Reached out to pt to see if she found the mail order box bubble pack of meds yet, she thinks her sister packed it up and took to the other apt.  She will keep looking for it and if its at the house they are still at now, then she will call me today and I can come by, if not then I will just f/u next mon or wed next week.   Kerry Hough, EMT-Paramedic  [phone] 09/14/2023

## 2023-09-15 NOTE — Progress Notes (Signed)
[name], [name] ([id number]) [phone].pdf Page 1 of 9 Visit Report for 09/13/2023 Arrival Information Details Patient Name: Date of Service: [name] 09/13/2023 12:30 PM Medical Record Number: [id number] Patient Account Number: [id number] Date of Birth/Sex: Treating RN: 1961/07/12 (62 y.o. [name], [name] Primary Care [name]: [name] Other Clinician: Referring [name]: Treating [name]/Extender: [name] Visit Information History Since Last Visit Added or deleted any medications: No Patient Arrived: [name] Any new allergies or adverse reactions: No Arrival Time: 12:51 Had a fall or experienced change in No Accompanied By: self activities of daily living that may affect Transfer Assistance: None risk of falls: Patient Identification Verified: Yes Signs or symptoms of abuse/neglect since last visito No Secondary Verification Process Completed: Yes Hospitalized since last visit: No Patient Requires Transmission-Based Precautions: No Implantable device outside of the clinic excluding No Patient Has Alerts: Yes cellular tissue based products placed in the center Patient Alerts: ABI R 0.57 (01/11/23) since last visit: ABI L 1.04 (01/11/23) Has Dressing in Place as Prescribed: No Pain Present Now: No Notes dried blood blister/callous/eschar noted to left lateral foot. Electronic Signature(Price) Signed: 09/14/2023 6:03:32 PM By: Karla Stall RN, BSN Entered By: [name] on 09/13/2023 09:55:33 -------------------------------------------------------------------------------- Encounter Discharge Information Details Patient Name: Date of Service: Karla Bigness, DO [name]. 09/13/2023 12:30 PM Medical Record Number: [id number] Patient Account Number: [id number] Date of Birth/Sex: Treating RN: 12-12-1960 (62 y.o. [name] Primary Care [name]: [name] Other Clinician: Referring  [name]: Treating [name]: [name] Encounter Discharge Information Items Post Procedure Vitals Discharge Condition: Stable Temperature (F): 98.4 Ambulatory Status: Walker Pulse (bpm): 74 Discharge Destination: Home Respiratory Rate (breaths/min): 20 Transportation: Private Auto Blood Pressure (mmHg): 153/81 Accompanied By: self Schedule Follow-up Appointment: Yes Clinical Summary of Care: Electronic Signature(Price) Signed: 09/14/2023 6:03:32 PM By: Karla Stall RN, BSN Entered By: [name] on 09/13/2023 10:13:45 [name] ([id number]) [id number] Page 2 of 9 -------------------------------------------------------------------------------- Lower Extremity Assessment Details Patient Name: Date of Service: [name] 09/13/2023 12:30 PM Medical Record Number: [id number] Patient Account Number: [id number] Date of Birth/Sex: Treating RN: 29-Jan-1961 (62 y.o. [name], [name] Primary Care [name]: [name] Other Clinician: Referring [name]: Treating [name]: [name], [name]: Price Edema Assessment Assessed: [Left: Yes] [Right: Yes] Edema: [Left: Yes] [Right: Yes] Calf Left: Right: Point of Measurement: 30 cm From Medial Instep 46 cm 46 cm Ankle Left: Right: Point of Measurement: 9 cm From Medial Instep 26 cm 26 cm Vascular Assessment Pulses: Dorsalis Pedis Palpable: [Left:Yes] [Right:Yes] Extremity colors, hair growth, and conditions: Extremity Color: [Left:Hyperpigmented] [Right:Hyperpigmented] Hair Growth on Extremity: [Left:No] [Right:No] Temperature of Extremity: [Left:Warm] [Right:Warm] Capillary Refill: [Left:< 3 seconds] [Right:< 3 seconds] Dependent Rubor: [Left:No] [Right:No] Blanched when Elevated: [Left:No Yes] [Right:Yes] Toe Nail Assessment Left: Right: Thick: Yes Yes Discolored: Yes Yes Deformed: Yes  Yes Improper Length and Hygiene: No No Electronic Signature(Price) Signed: 09/14/2023 6:03:32 PM By: Karla Stall RN, BSN Entered By: [name] on 09/13/2023 09:52:27 -------------------------------------------------------------------------------- Multi-Disciplinary Care Plan Details Patient Name: Date of Service: Karla Bigness, DO [name]. 09/13/2023 12:30 PM Medical Record Number: [id number] Patient Account Number: [id number] Date of Birth/Sex: Treating RN: [birth year] (61 y.o. [name] Primary Care [name]: [name] Other Clinician: Referring [name]: Treating [name]/Extender: [name], [name]: [address] [name], [name] ([id number]) [id number] Page 3 of 9 Wound/Skin Impairment  Nursing Diagnoses: Impaired tissue integrity Goals: Patient/caregiver will verbalize understanding of skin care regimen Date Initiated: 04/19/2023 Target Resolution Date: 10/19/2023 Goal Status: Active Interventions: Assess ulceration(Price) every visit Price Activities: Skin care regimen initiated : 04/19/2023 Notes: Electronic Signature(Price) Signed: 09/14/2023 6:03:32 PM By: Karla Stall RN, BSN Entered By: [name] on 09/13/2023 09:58:01 -------------------------------------------------------------------------------- Pain Assessment Details Patient Name: Date of Service: Karla Bigness, DO [name]. 09/13/2023 12:30 PM Medical Record Number: [id number] Patient Account Number: [id number] Date of Birth/Sex: Treating RN: 1961/08/01 (61 y.o. [name] Primary Care [name]: [name] Other Clinician: Referring [name]: Treating [name]/Extender: [name] Active Problems Location of Pain Severity and Description of Pain Patient Has Paino No Site Locations Pain Management and Medication Current Pain Management: Electronic Signature(Price) Signed: 09/14/2023 6:03:32 PM By:  Karla Stall RN, BSN Entered By: [name] on 09/13/2023 09:51:48 [name] ([id number]) [id number] Page 4 of 9 -------------------------------------------------------------------------------- Patient/Caregiver Education Details Patient Name: Date of Service: [name] 10/30/2024andnbsp12:30 PM Medical Record Number: [id number] Patient Account Number: [id number] Date of Birth/Gender: Treating RN: April 25, 1961 (62 y.o. [name] Primary Care Physician: [name] Other Clinician: Referring Physician: Treating Physician/Extender: [name] Education Assessment Education Provided To: Patient Education Topics Provided Wound/Skin Impairment: Handouts: Caring for Your Ulcer Methods: Explain/Verbal Responses: Reinforcements needed Electronic Signature(Price) Signed: 09/14/2023 6:03:32 PM By: Karla Stall RN, BSN Entered By: [name] on 09/13/2023 09:58:19 -------------------------------------------------------------------------------- Wound Assessment Details Patient Name: Date of Service: [name]. 09/13/2023 12:30 PM Medical Record Number: [id number] Patient Account Number: [id number] Date of Birth/Sex: Treating RN: April 29, 1961 (61 y.o. [name], [name] Primary Care Shandra Szymborski: [name] Other Clinician: Referring Hermena Swint: Treating Abdulla Pooley/Extender: [name], [name]: Price Wound Status Wound Number: 23 Primary Diabetic Wound/Ulcer of the Lower Extremity Etiology: Wound Location: Right Metatarsal head first Wound Open Wounding Event: Gradually Appeared Status: Date Acquired: 04/18/2021 Comorbid Chronic Obstructive Pulmonary Disease (COPD), Sleep Apnea, Price Of Price: Price History: Arrhythmia, Congestive Heart Failure, Coronary Artery Disease, Clustered Wound: No Hypertension, Peripheral Arterial Disease, Cirrhosis , Type  II Diabetes, Osteoarthritis, Neuropathy Photos Wound Measurements Hesch, Lucciana Price ([id number]) Length: (cm) 1.1 Width: (cm) 0.7 Depth: (cm) 0.2 Area: (cm) 0.605 Volume: (cm) 0.121 [id number] Page 5 of 9 % Reduction in Area: 35.8% % Reduction in Volume: 74.3% Epithelialization: None Tunneling: No Undermining: No Wound Description Classification: Grade 2 Wound Margin: Distinct, outline attached Exudate Amount: Medium Exudate Type: Serosanguineous Exudate Color: red, brown Foul Odor After Cleansing: No Slough/Fibrino Yes Wound Bed Granulation Amount: Large (67-100%) Exposed Structure Granulation Quality: Pink Fascia Exposed: No Necrotic Amount: Small (1-33%) Fat Layer (Subcutaneous Tissue) Exposed: Yes Necrotic Quality: Adherent Slough Tendon Exposed: No Muscle Exposed: No Joint Exposed: No Bone Exposed: No Periwound Skin Texture Texture Color No Abnormalities Noted: No No Abnormalities Noted: Yes Callus: Yes Temperature / Pain Temperature: No Abnormality Moisture No Abnormalities Noted: No Dry / Scaly: No Maceration: Yes Price Notes Wound #23 (Metatarsal head first) Wound Laterality: Right Cleanser Soap and Water Discharge Instruction: May shower and wash wound with dial antibacterial soap and water prior to dressing change. Wound Cleanser Discharge Instruction: Cleanse the wound with wound cleanser prior to applying a clean dressing using gauze sponges, not tissue or cotton balls. Peri-Wound Care Sween Lotion (Moisturizing lotion) Discharge Instruction: Apply moisturizing lotion as directed Topical Primary Dressing IODOFLEX 0.9% Cadexomer Iodine Pad 4x6 cm Discharge Instruction: *****OR  IODOSORB****Apply to wound bed as instructed Secondary Dressing ABD Pad, 8x10 Discharge Instruction: Apply over primary dressing as directed. Secured With [employer], 4.5x3.1 (in/yd) Discharge Instruction: Secure with Kerlix as  directed. 40M Medipore H Soft Cloth Surgical T ape, 4 x 10 (in/yd) Discharge Instruction: Secure with tape as directed. Compression Wrap tubigrip size E single layer Discharge Instruction: apply in the morning and remove at night. Compression Stockings Add-Ons Electronic Signature(Price) Signed: 09/14/2023 6:03:32 PM By: Karla Stall RN, BSN Entered By: [name] on 09/13/2023 09:54:44 [name] ([id number]) [id number] Page 6 of 9 -------------------------------------------------------------------------------- Wound Assessment Details Patient Name: Date of Service: [name] 09/13/2023 12:30 PM Medical Record Number: [id number] Patient Account Number: [id number] Date of Birth/Sex: Treating RN: [birth year] (62 y.o. [name], [name] Primary Care Nikisha Fleece: [name] Other Clinician: Referring Mete Purdum: Treating Acea Yagi/Extender: [name], [name]: Price Wound Status Wound Number: 29 Primary Trauma, Other Etiology: Wound Location: Right, Anterior Lower Leg Wound Open Wounding Event: Bump Status: Date Acquired: 08/20/2023 Comorbid Chronic Obstructive Pulmonary Disease (COPD), Sleep Apnea, Price Of Price: 3 History: Arrhythmia, Congestive Heart Failure, Coronary Artery Disease, Clustered Wound: No Hypertension, Peripheral Arterial Disease, Cirrhosis , Type II Diabetes, Osteoarthritis, Neuropathy Photos Wound Measurements Length: (cm) 1.4 Width: (cm) 1.8 Depth: (cm) 0.3 Area: (cm) 1.979 Volume: (cm) 0.594 % Reduction in Area: -68% % Reduction in Volume: -403.4% Epithelialization: None Tunneling: No Undermining: Yes Starting Position (o'clock): 6 Ending Position (o'clock): 8 Maximum Distance: (cm) 0.9 Wound Description Classification: Full Thickness Without Exposed Suppor Wound Margin: Distinct, outline attached Exudate Amount: Medium Exudate Type: Serosanguineous Exudate Color: red, brown t  Structures Foul Odor After Cleansing: No Slough/Fibrino Yes Wound Bed Granulation Amount: Large (67-100%) Exposed Structure Granulation Quality: Red Fat Layer (Subcutaneous Tissue) Exposed: Yes Necrotic Amount: Small (1-33%) Necrotic Quality: Adherent Slough Periwound Skin Texture Texture Color No Abnormalities Noted: No No Abnormalities Noted: No Callus: No Atrophie Blanche: No Crepitus: No Cyanosis: No Excoriation: No Ecchymosis: No Induration: No Erythema: No Rash: No Hemosiderin Staining: Yes Scarring: No Mottled: No Pallor: No [name], [name] ([id number]) [phone].pdf Page 7 of 9 Pallor: No Moisture Rubor: No No Abnormalities Noted: No Dry / Scaly: No Maceration: No Price Notes Wound #29 (Lower Leg) Wound Laterality: Right, Anterior Cleanser Soap and Water Discharge Instruction: May shower and wash wound with dial antibacterial soap and water prior to dressing change. Wound Cleanser Discharge Instruction: Cleanse the wound with wound cleanser prior to applying a clean dressing using gauze sponges, not tissue or cotton balls. Peri-Wound Care Sween Lotion (Moisturizing lotion) Discharge Instruction: Apply moisturizing lotion as directed Topical Primary Dressing IODOFLEX 0.9% Cadexomer Iodine Pad 4x6 cm Discharge Instruction: *****OR IODOSORB****Apply to wound bed as instructed Secondary Dressing ABD Pad, 8x10 Discharge Instruction: Apply over primary dressing as directed. Secured With [employer], 4.5x3.1 (in/yd) Discharge Instruction: Secure with Kerlix as directed. 40M Medipore H Soft Cloth Surgical T ape, 4 x 10 (in/yd) Discharge Instruction: Secure with tape as directed. Compression Wrap tubigrip size E single layer Discharge Instruction: apply in the morning and remove at night. Compression Stockings Add-Ons Electronic Signature(Price) Signed: 09/14/2023 6:03:32 PM By: Karla Stall RN, BSN Entered By: [name]  on 09/13/2023 09:55:08 -------------------------------------------------------------------------------- Wound Assessment Details Patient Name: Date of Service: Karla Bigness, DO [name]. 09/13/2023 12:30 PM Medical Record Number: [id number] Patient Account Number: [id number] Date of Birth/Sex: Treating RN: 11/28/1960 (62 y.o. [name] Primary Care Deadrian Toya: [name] Other Clinician: Referring Kaleigh Spiegelman:  Treating Hurley Blevins/Extender: [name], [name]: Price Wound Status Wound Number: 30 Primary Venous Leg Ulcer Etiology: Wound Location: Right, Distal, Posterior Lower Leg Wound Open Wounding Event: Gradually Appeared Status: Date Acquired: 09/06/2023 Comorbid Chronic Obstructive Pulmonary Disease (COPD), Sleep Apnea, Price Of Price: 1 History: Arrhythmia, Congestive Heart Failure, Coronary Artery Disease, Clustered Wound: No Hypertension, Peripheral Arterial Disease, Cirrhosis , Type II Diabetes, Osteoarthritis, Neuropathy [name], [name] ([id number]) [id number] Page 8 of 9 Photos Wound Measurements Length: (cm) 0.5 Width: (cm) 0.6 Depth: (cm) 0.1 Area: (cm) 0.236 Volume: (cm) 0.024 % Reduction in Area: -100% % Reduction in Volume: -100% Epithelialization: None Tunneling: No Undermining: No Wound Description Classification: Full Thickness Without Exposed Suppor Wound Margin: Distinct, outline attached Exudate Amount: Medium Exudate Type: Serosanguineous Exudate Color: red, brown t Structures Foul Odor After Cleansing: No Slough/Fibrino Yes Wound Bed Granulation Amount: Small (1-33%) Exposed Structure Granulation Quality: Red, Pink Fascia Exposed: No Necrotic Amount: Large (67-100%) Fat Layer (Subcutaneous Tissue) Exposed: Yes Necrotic Quality: Adherent Slough Tendon Exposed: No Muscle Exposed: No Joint Exposed: No Bone Exposed: No Periwound Skin Texture Texture Color No Abnormalities Noted: No No  Abnormalities Noted: No Callus: No Atrophie Blanche: No Crepitus: No Cyanosis: No Excoriation: No Ecchymosis: No Induration: No Erythema: No Rash: No Hemosiderin Staining: Yes Scarring: No Mottled: No Pallor: No Moisture Rubor: No No Abnormalities Noted: No Dry / Scaly: No Temperature / Pain Maceration: No Temperature: No Abnormality Price Notes Wound #30 (Lower Leg) Wound Laterality: Right, Posterior, Distal Cleanser Soap and Water Discharge Instruction: May shower and wash wound with dial antibacterial soap and water prior to dressing change. Wound Cleanser Discharge Instruction: Cleanse the wound with wound cleanser prior to applying a clean dressing using gauze sponges, not tissue or cotton balls. Peri-Wound Care Sween Lotion (Moisturizing lotion) Discharge Instruction: Apply moisturizing lotion as directed Topical Primary Dressing IODOFLEX 0.9% Cadexomer Iodine Pad 4x6 cm Discharge Instruction: *****OR IODOSORB****Apply to wound bed as instructed Secondary Dressing SUDA, FORBESS ([id number]) [id number] Page 9 of 9 ABD Pad, 8x10 Discharge Instruction: Apply over primary dressing as directed. Secured With [employer], 4.5x3.1 (in/yd) Discharge Instruction: Secure with Kerlix as directed. 71M Medipore H Soft Cloth Surgical T ape, 4 x 10 (in/yd) Discharge Instruction: Secure with tape as directed. Compression Wrap tubigrip size E single layer Discharge Instruction: apply in the morning and remove at night. Compression Stockings Add-Ons Electronic Signature(Price) Signed: 09/14/2023 6:03:32 PM By: Karla Stall RN, BSN Entered By: [name] on 09/13/2023 09:56:19 -------------------------------------------------------------------------------- Vitals Details Patient Name: Date of Service: Karla Bigness, DO [name]. 09/13/2023 12:30 PM Medical Record Number: [id number] Patient Account Number: [id number] Date of Birth/Sex: Treating  RN: 1961/07/11 (62 y.o. [name] Primary Care Joannah Gitlin: [name] Other Clinician: Referring Corian Handley: Treating Bergen Melle/Extender: [name], [name]: Price Vital Signs Time Taken: 12:50 Temperature (F): 98.4 Height (in): 64 Pulse (bpm): 79 Weight (lbs): 225 Respiratory Rate (breaths/min): 24 Body Mass Index (BMI): 38.6 Blood Pressure (mmHg): 153/81 Reference Range: 80 - 120 mg / dl Electronic Signature(Price) Signed: 09/14/2023 6:03:32 PM By: Karla Stall RN, BSN Entered By: [name] on 09/13/2023 09:51:43

## 2023-09-18 ENCOUNTER — Other Ambulatory Visit (HOSPITAL_COMMUNITY): Payer: Self-pay | Admitting: Cardiology

## 2023-09-18 ENCOUNTER — Other Ambulatory Visit (HOSPITAL_COMMUNITY): Payer: Self-pay

## 2023-09-18 DIAGNOSIS — I5042 Chronic combined systolic (congestive) and diastolic (congestive) heart failure: Secondary | ICD-10-CM

## 2023-09-18 NOTE — Progress Notes (Signed)
Paramedicine Encounter    Patient ID: [name], female    DOB: 1961-10-21, 62 y.o.   MRN: [id number]   Complaints-back pain and foot pain   Edema-yes   Compliance with meds-yes  Pill box filled-yes  If so, by whom-paramedic   Refills needed- Eliquis  Losartan      Pt reports she is doing ok. She is having chronic back pain, she reports her foot wound started bleeding, she thinks it was b/c she was on it too much the other day.  She does go to wound doc Wednesday.   Meds verified and pill box refilled.   She still does not have the box of meds that was mailed out-thinks its at the new apt.  -that has her synthroid in that box.  -needs wellbutrin for thurs-rest of week  - needs vascepa for sat pm dose   Unable to weigh due to her foot wounds.  Will come out on Wednesday to finish pill box. She will be at her new apt.  Will update her address in epic once she actually gets in the apt.  Pharmacy will fill the meds listed above.  Will get her other refills called in next week.   [address]  [address] [facility]   CBG-200s BP 136/72   Pulse 72   Resp 18   LMP  (LMP Unknown)   SpO2 96%  Weight yesterday-?  Last visit weight-?  Patient Care Team: [name], MD as PCP - General (Family Medicine) Runell Gess, MD as PCP - Cardiology (Cardiology) Lanier Prude, MD as PCP - Electrophysiology (Cardiology) Runell Gess, MD as Consulting Physician (Cardiology) Nyoka Cowden, MD as Consulting Physician (Pulmonary Disease) System, Provider Not In  Patient Active Problem List   Diagnosis Date Noted   Severe sepsis with acute organ dysfunction ([name]) 07/27/2023   Urinary tract infection without hematuria 07/27/2023   Encephalopathy acute 07/27/2023   Acute renal failure superimposed on stage 3 chronic kidney disease ([name]) 07/27/2023   Severe sepsis ([name]) 07/27/2023   Pre-ulcerative calluses 04/25/2022   Viral gastroenteritis 04/18/2022    Hyperlipidemia associated with type 2 diabetes mellitus ([name]) 07/29/2021   Uncontrolled type 2 diabetes mellitus with hyperglycemia ([name]) 06/13/2021   Acute kidney injury superimposed on CKD ([name]) 06/13/2021   Critical ischemia of foot ([name]) 06/07/2021   Fall 05/05/2021   PAF (paroxysmal atrial fibrillation) ([name]) 05/05/2021   COPD (chronic obstructive pulmonary disease) ([name]) 05/05/2021   DM (diabetes mellitus), type 2 with complications ([name]) 05/05/2021   PAD (peripheral artery disease) ([name]) 05/05/2021   Cellulitis 05/05/2021   Acute on chronic combined systolic and diastolic CHF (congestive heart failure) ([name]) 05/05/2021   OSA (obstructive sleep apnea) 05/05/2021   Stage 3b chronic kidney disease ([name]) 05/05/2021   AKI (acute kidney injury) ([name]) 05/04/2021   Pressure injury of skin 05/04/2021   Ulcer of foot, unspecified laterality, limited to breakdown of skin ([name]) 02/17/2021   AF (paroxysmal atrial fibrillation) ([name]) 01/02/2021   CAD (coronary artery disease) 01/02/2021   PVD (peripheral vascular disease) ([name]) 01/02/2021   Diabetes mellitus type 2, uncontrolled, with complications 01/02/2021   Diabetic nephropathy ([name]) 01/02/2021   Low back pain 06/01/2020   Hyperlipidemia 04/03/2020   Muscle weakness 03/20/2020   Pain in elbow 03/12/2020   PAD (peripheral artery disease) ([name]) 12/26/2019   Acute renal failure ([name])    Acute respiratory failure with hypoxia ([name]) 12/02/2019   Cellulitis in diabetic foot (  [name]) 07/08/2019   Osteomyelitis ([name]) 06/21/2019   NICM (nonischemic cardiomyopathy) ([name]) 06/20/2019   Non-healing ulcer ([name]) 06/20/2019   CHF (congestive heart failure) ([name]) 11/26/2018   Coagulation disorder ([name]) 08/09/2017   Depression 07/21/2017   At risk for adverse drug reaction 06/20/2017   Peripheral neuropathy 06/20/2017   S/P transmetatarsal amputation of foot, right ([name]) 06/05/2017   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 with  ulcer and inflammation ([name]) 05/19/2017   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[name])    SIRS (systemic inflammatory response syndrome) ([name]) 04/06/2017   Suspected sleep apnea 11/24/2016   Ulcer of toe of right foot, with necrosis of bone ([name]) 10/27/2016   Ulcer of left lower leg ([name]) 05/19/2016   Severe obesity (BMI >= 40) ([name]) 02/24/2016   Morbid obesity ([name]) 02/24/2016   Encounter for therapeutic drug monitoring 02/10/2016   Symptomatic bradycardia 01/12/2016   Hypertension associated with diabetes ([name]) 12/22/2015   Chronic combined systolic and diastolic CHF (congestive heart failure) ([name])    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 11/08/2015   Hypokalemia 11/08/2015   Tobacco abuse 10/23/2015   Coronary artery disease    DOE (dyspnea on exertion) 04/29/2015   Diabetes mellitus type 2, uncontrolled 02/08/2015   Influenza A 02/07/2015   Wrist fracture, left, closed, initial encounter 01/29/2015   PAF (paroxysmal atrial fibrillation) ([name]) 01/16/2015   Carotid arterial disease ([name]) 01/16/2015   Claudication ([name]) 01/15/2015   Demand ischemia ([name]) 10/29/2014   Insomnia 02/03/2014   S/P peripheral artery angioplasty - TurboHawk atherectomy; R SFA 09/11/2013    Class: Acute   Leg pain, bilateral 08/19/2013   Hypothyroidism 07/31/2013   Cellulitis 06/13/2013   History of cocaine abuse ([name]) 06/13/2013   Long term current use of anticoagulant therapy 05/20/2013   Alcohol abuse    Narcotic abuse ([name])    Marijuana abuse    Alcoholic cirrhosis ([name])    DM (diabetes mellitus), type 2 with peripheral vascular complications ([name])     Current Outpatient Medications:    ACCU-CHEK GUIDE test strip, USE TO CHECK BLOOD SUGAR FOUR TIMES DAILY, Disp: , Rfl:    acetaminophen (TYLENOL) 500 MG tablet, Take 1,000 mg by mouth every 6 (six) hours as needed for headache (pain)., Disp: , Rfl:    albuterol (VENTOLIN HFA) 108 (90 Base) MCG/ACT inhaler, USE 2 PUFFS BY MOUTH EVERY FOUR HOURS, AS NEEDED, FOR  COUGHING/WHEEZING, Disp: 8.5 g, Rfl: 0   apixaban (ELIQUIS) 5 MG TABS tablet, Take 1 tablet (5 mg total) by mouth 2 (two) times daily., Disp: 60 tablet, Rfl: 6   budesonide-formoterol (SYMBICORT) 160-4.5 MCG/ACT inhaler, Inhale 2 puffs into the lungs 2 (two) times daily., Disp: 1 each, Rfl: 2   buPROPion ER (WELLBUTRIN SR) 100 MG 12 hr tablet, Take 1 tablet (100 mg total) by mouth daily., Disp: 30 tablet, Rfl: 6   Cholecalciferol (VITAMIN D3 PO), Take 1 capsule by mouth every morning., Disp: , Rfl:    EASY COMFORT PEN NEEDLES 31G X 5 MM MISC, USE FOUR TIMES A DAY FOR INSULIN ADMINISTRATION, Disp: 100 each, Rfl: 1   insulin lispro (HUMALOG) 100 UNIT/ML KwikPen, Inject 10 Units into the skin 3 (three) times daily with meals., Disp: 15 mL, Rfl: 0   LANTUS SOLOSTAR 100 UNIT/ML Solostar Pen, Inject 37 Units into the skin daily. (Patient taking differently: Inject 40 Units into the skin daily.), Disp: 15 mL, Rfl: 0   levothyroxine (SYNTHROID) 50 MCG tablet, TAKE 1 TABLET (50 MCG TOTAL) BY MOUTH DAILY  BEFORE BREAKFAST (AM), Disp: 30 tablet, Rfl: 0   losartan (COZAAR) 25 MG tablet, TAKE 2 TABLETS (50 MG TOTAL) BY MOUTH DAILY (AM), Disp: 60 tablet, Rfl: 3   metoprolol tartrate (LOPRESSOR) 25 MG tablet, Take 0.5 tablets (12.5 mg total) by mouth 2 (two) times daily., Disp: 60 tablet, Rfl: 1   pantoprazole (PROTONIX) 40 MG tablet, TAKE 1 TABLET (40 MG TOTAL) BY MOUTH DAILY(AM), Disp: 30 tablet, Rfl: 0   rosuvastatin (CRESTOR) 40 MG tablet, TAKE 1 TABLET (40 MG TOTAL) BY MOUTH DAILY (BEDTIME), Disp: 90 tablet, Rfl: 0   torsemide (DEMADEX) 100 MG tablet, TAKE 1 TABLET (100 MG TOTAL) BY MOUTH 2 (TWO) TIMES DAILY. (AM+BEDTIME), Disp: 60 tablet, Rfl: 3   triamcinolone ointment (KENALOG) 0.1 %, Apply topically 2 (two) times daily., Disp: 454 g, Rfl: 0   VITAMIN E PO, Take 1 tablet by mouth daily., Disp: , Rfl:    colchicine 0.6 MG tablet, Take 1.2 mg by mouth See admin instructions. Take 1.2 mg for flair up and an  hour later take 0.6 mg if needed, Disp: , Rfl:    doxycycline (VIBRAMYCIN) 100 MG capsule, Take 100 mg by mouth 2 (two) times daily., Disp: , Rfl:    metolazone (ZAROXOLYN) 2.5 MG tablet, TO BE USED AS DIRECTED BY HEART FAILURE CLINIC (Patient not taking: Reported on 09/18/2023), Disp: 10 tablet, Rfl: 0   nystatin cream (MYCOSTATIN), Apply topically 2 (two) times daily., Disp: 30 g, Rfl: 0   Omega-3 Fatty Acids (FISH OIL PO), Take 1 capsule by mouth every morning., Disp: , Rfl:    polyethylene glycol (MIRALAX / GLYCOLAX) 17 g packet, Take 17 g by mouth daily as needed for moderate constipation., Disp: 14 each, Rfl: 0   VASCEPA 1 g capsule, TAKE 2 CAPSULES (2 G TOTAL) BY MOUTH 2 (TWO) TIMES DAILY. (2N+2HS), Disp: 120 capsule, Rfl: 0 Allergies  Allergen Reactions   Gabapentin Nausea And Vomiting and Other (See Comments)    POSSIBLE SHAKING   Lyrica [Pregabalin] Other (See Comments)    Shaking       Social History   Socioeconomic History   Marital status: Single    Spouse name: Not on file   Number of children: 1   Years of education: 12   Highest education level: 12th grade  Occupational History   Occupation: disabled  Tobacco Use   Smoking status: Former    Current packs/day: 1.00    Average packs/day: 1 pack/day for 44.0 years (44.0 ttl pk-yrs)    Types: E-cigarettes, Cigarettes   Smokeless tobacco: Former    Types: Snuff  Vaping Use   Vaping status: Former   Devices: 11/26/2018 "stopped months ago"  Substance and Sexual Activity   Alcohol use: Not Currently    Comment: occ   Drug use: Not Currently    Types: "Crack" cocaine, Marijuana, Oxycodone   Sexual activity: Not Currently  Other Topics Concern   Not on file  Social History Narrative   ** Merged History Encounter **       Lives in [place], in motel with sister.  They are looking to move but don't have a place to go yet.     Social Determinants of Health   Financial Resource Strain: Low Risk  (02/09/2023)   Overall  Financial Resource Strain (CARDIA)    Difficulty of Paying Living Expenses: Not hard at all  Food Insecurity: No Food Insecurity (02/09/2023)   Hunger Vital Sign    Worried About Running Out  of Food in the Last Year: Never true    Ran Out of Food in the Last Year: Never true  Transportation Needs: No Transportation Needs (02/09/2023)   PRAPARE - Administrator, Civil Service (Medical): No    Lack of Transportation (Non-Medical): No  Physical Activity: Inactive (02/09/2023)   Exercise Vital Sign    Days of Exercise per Week: 0 days    Minutes of Exercise per Session: 0 min  Stress: No Stress Concern Present (02/09/2023)   [employer] of Occupational Health - Occupational Stress Questionnaire    Feeling of Stress : Not at all  Social Connections: Unknown (08/21/2023)   [facility]   Social Network    Social Network: Not on file  Intimate Partner Violence: Unknown (08/21/2023)   Received from [facility]   HITS    Physically Hurt: Not on file    Insult or Talk Down To: Not on file    Threaten Physical Harm: Not on file    Scream or Curse: Not on file    Physical Exam      Future Appointments  Date Time Provider Department Center  09/20/2023 12:30 PM Maxwell Caul, MD [facility]  09/27/2023 12:30 PM Allen Derry [place] III, PA-C [facility]  10/03/2023  2:20 PM Laurey Morale, MD [facility] None  10/04/2023  2:00 PM Allen Derry [place] III, PA-C [facility]  10/10/2023 11:00 AM Leonie Douglas, MD [facility]  10/11/2023  1:30 PM Allen Derry [place] III, PA-C [facility]  02/12/2024  4:00 PM [facility] VISIT [facility] None       Kerry Hough, Paramedic [phone] [facility] Paramedic  09/18/23

## 2023-09-20 ENCOUNTER — Other Ambulatory Visit (HOSPITAL_COMMUNITY): Payer: Self-pay

## 2023-09-20 ENCOUNTER — Ambulatory Visit (HOSPITAL_BASED_OUTPATIENT_CLINIC_OR_DEPARTMENT_OTHER): Payer: 59 | Admitting: Internal Medicine

## 2023-09-20 NOTE — Progress Notes (Signed)
Went by pharmacy to p/u meds for her to take back for med rec.  She is at the new apt now.  She has left her pill box at the old apt, they will get today though.   I advised her of the need of her thyroid pill-which is packed up somewhere in the other mail order box and will begin to take once she finds it and also the wellbutrin and vascepa and where each one of those go.  They are easy to distinguish between the other pills.   She understands. Once we get meds straight and all found then will move her back to bubble packs depending on future appointments etc.   Will see her again next week.   Kerry Hough, EMT-Paramedic  [phone] 09/20/2023

## 2023-09-23 ENCOUNTER — Inpatient Hospital Stay (HOSPITAL_COMMUNITY)
Admission: EM | Admit: 2023-09-23 | Discharge: 2023-09-27 | DRG: 280 | Disposition: A | Discharge status: Home / Self Care | Payer: 59 | Attending: Internal Medicine | Admitting: Internal Medicine

## 2023-09-23 ENCOUNTER — Emergency Department (HOSPITAL_COMMUNITY): Payer: 59

## 2023-09-23 ENCOUNTER — Encounter (HOSPITAL_COMMUNITY): Payer: Self-pay | Admitting: Internal Medicine

## 2023-09-23 ENCOUNTER — Other Ambulatory Visit: Payer: Self-pay

## 2023-09-23 DIAGNOSIS — D72829 Elevated white blood cell count, unspecified: Secondary | ICD-10-CM | POA: Diagnosis not present

## 2023-09-23 DIAGNOSIS — Z7951 Long term (current) use of inhaled steroids: Secondary | ICD-10-CM

## 2023-09-23 DIAGNOSIS — I21A1 Myocardial infarction type 2: Secondary | ICD-10-CM | POA: Diagnosis present

## 2023-09-23 DIAGNOSIS — L97929 Non-pressure chronic ulcer of unspecified part of left lower leg with unspecified severity: Secondary | ICD-10-CM | POA: Diagnosis present

## 2023-09-23 DIAGNOSIS — Z825 Family history of asthma and other chronic lower respiratory diseases: Secondary | ICD-10-CM

## 2023-09-23 DIAGNOSIS — Z89421 Acquired absence of other right toe(s): Secondary | ICD-10-CM

## 2023-09-23 DIAGNOSIS — E1151 Type 2 diabetes mellitus with diabetic peripheral angiopathy without gangrene: Secondary | ICD-10-CM | POA: Diagnosis present

## 2023-09-23 DIAGNOSIS — J9811 Atelectasis: Secondary | ICD-10-CM | POA: Diagnosis present

## 2023-09-23 DIAGNOSIS — I739 Peripheral vascular disease, unspecified: Secondary | ICD-10-CM | POA: Diagnosis present

## 2023-09-23 DIAGNOSIS — L97919 Non-pressure chronic ulcer of unspecified part of right lower leg with unspecified severity: Secondary | ICD-10-CM | POA: Diagnosis present

## 2023-09-23 DIAGNOSIS — I4891 Unspecified atrial fibrillation: Secondary | ICD-10-CM | POA: Diagnosis not present

## 2023-09-23 DIAGNOSIS — F17210 Nicotine dependence, cigarettes, uncomplicated: Secondary | ICD-10-CM | POA: Diagnosis present

## 2023-09-23 DIAGNOSIS — R079 Chest pain, unspecified: Secondary | ICD-10-CM | POA: Diagnosis present

## 2023-09-23 DIAGNOSIS — I5042 Chronic combined systolic (congestive) and diastolic (congestive) heart failure: Secondary | ICD-10-CM | POA: Diagnosis present

## 2023-09-23 DIAGNOSIS — I2510 Atherosclerotic heart disease of native coronary artery without angina pectoris: Secondary | ICD-10-CM | POA: Diagnosis present

## 2023-09-23 DIAGNOSIS — Z7901 Long term (current) use of anticoagulants: Secondary | ICD-10-CM

## 2023-09-23 DIAGNOSIS — Z8249 Family history of ischemic heart disease and other diseases of the circulatory system: Secondary | ICD-10-CM

## 2023-09-23 DIAGNOSIS — D631 Anemia in chronic kidney disease: Secondary | ICD-10-CM | POA: Diagnosis present

## 2023-09-23 DIAGNOSIS — E785 Hyperlipidemia, unspecified: Secondary | ICD-10-CM | POA: Diagnosis present

## 2023-09-23 DIAGNOSIS — K7030 Alcoholic cirrhosis of liver without ascites: Secondary | ICD-10-CM | POA: Diagnosis present

## 2023-09-23 DIAGNOSIS — E039 Hypothyroidism, unspecified: Secondary | ICD-10-CM | POA: Diagnosis present

## 2023-09-23 DIAGNOSIS — Z794 Long term (current) use of insulin: Secondary | ICD-10-CM

## 2023-09-23 DIAGNOSIS — J449 Chronic obstructive pulmonary disease, unspecified: Secondary | ICD-10-CM | POA: Diagnosis present

## 2023-09-23 DIAGNOSIS — E782 Mixed hyperlipidemia: Secondary | ICD-10-CM

## 2023-09-23 DIAGNOSIS — Z89431 Acquired absence of right foot: Secondary | ICD-10-CM

## 2023-09-23 DIAGNOSIS — E038 Other specified hypothyroidism: Secondary | ICD-10-CM

## 2023-09-23 DIAGNOSIS — Z832 Family history of diseases of the blood and blood-forming organs and certain disorders involving the immune mechanism: Secondary | ICD-10-CM

## 2023-09-23 DIAGNOSIS — I2489 Other forms of acute ischemic heart disease: Secondary | ICD-10-CM | POA: Diagnosis present

## 2023-09-23 DIAGNOSIS — Z833 Family history of diabetes mellitus: Secondary | ICD-10-CM

## 2023-09-23 DIAGNOSIS — I480 Paroxysmal atrial fibrillation: Secondary | ICD-10-CM | POA: Diagnosis not present

## 2023-09-23 DIAGNOSIS — Z79899 Other long term (current) drug therapy: Secondary | ICD-10-CM

## 2023-09-23 DIAGNOSIS — D638 Anemia in other chronic diseases classified elsewhere: Secondary | ICD-10-CM | POA: Diagnosis present

## 2023-09-23 DIAGNOSIS — Z79890 Hormone replacement therapy (postmenopausal): Secondary | ICD-10-CM

## 2023-09-23 DIAGNOSIS — Z888 Allergy status to other drugs, medicaments and biological substances status: Secondary | ICD-10-CM

## 2023-09-23 DIAGNOSIS — I5030 Unspecified diastolic (congestive) heart failure: Secondary | ICD-10-CM | POA: Diagnosis present

## 2023-09-23 DIAGNOSIS — D649 Anemia, unspecified: Secondary | ICD-10-CM | POA: Diagnosis present

## 2023-09-23 DIAGNOSIS — N183 Chronic kidney disease, stage 3 unspecified: Secondary | ICD-10-CM | POA: Diagnosis present

## 2023-09-23 DIAGNOSIS — Z6838 Body mass index (BMI) 38.0-38.9, adult: Secondary | ICD-10-CM

## 2023-09-23 DIAGNOSIS — K219 Gastro-esophageal reflux disease without esophagitis: Secondary | ICD-10-CM | POA: Diagnosis present

## 2023-09-23 DIAGNOSIS — I5033 Acute on chronic diastolic (congestive) heart failure: Secondary | ICD-10-CM | POA: Diagnosis present

## 2023-09-23 DIAGNOSIS — F419 Anxiety disorder, unspecified: Secondary | ICD-10-CM | POA: Diagnosis present

## 2023-09-23 DIAGNOSIS — E1122 Type 2 diabetes mellitus with diabetic chronic kidney disease: Secondary | ICD-10-CM | POA: Diagnosis present

## 2023-09-23 DIAGNOSIS — R7989 Other specified abnormal findings of blood chemistry: Secondary | ICD-10-CM | POA: Diagnosis present

## 2023-09-23 DIAGNOSIS — E1142 Type 2 diabetes mellitus with diabetic polyneuropathy: Secondary | ICD-10-CM | POA: Diagnosis present

## 2023-09-23 DIAGNOSIS — Z720 Tobacco use: Secondary | ICD-10-CM | POA: Diagnosis present

## 2023-09-23 DIAGNOSIS — E669 Obesity, unspecified: Secondary | ICD-10-CM

## 2023-09-23 DIAGNOSIS — N1832 Chronic kidney disease, stage 3b: Secondary | ICD-10-CM | POA: Diagnosis present

## 2023-09-23 DIAGNOSIS — E1165 Type 2 diabetes mellitus with hyperglycemia: Secondary | ICD-10-CM

## 2023-09-23 DIAGNOSIS — I130 Hypertensive heart and chronic kidney disease with heart failure and stage 1 through stage 4 chronic kidney disease, or unspecified chronic kidney disease: Secondary | ICD-10-CM | POA: Diagnosis present

## 2023-09-23 LAB — URINALYSIS, ROUTINE W REFLEX MICROSCOPIC
Bilirubin Urine: NEGATIVE
Glucose, UA: >=500 mg/dL — AB
Hgb urine dipstick: NEGATIVE
Ketones, ur: NEGATIVE mg/dL
Nitrite: NEGATIVE
Protein, ur: NEGATIVE mg/dL
Specific Gravity, Urine: 1.009 (ref 1.005–1.030)
pH: 5 (ref 5.0–8.0)

## 2023-09-23 LAB — CBG MONITORING, ED
Glucose-Capillary: 489 mg/dL — ABNORMAL HIGH (ref 70–99)
Glucose-Capillary: 489 mg/dL — ABNORMAL HIGH (ref 70–99)
Glucose-Capillary: 414 mg/dL — ABNORMAL HIGH (ref 70–99)

## 2023-09-23 LAB — TROPONIN I (HIGH SENSITIVITY)
Troponin I (High Sensitivity): 39 ng/L — ABNORMAL HIGH (ref <18)
Troponin I (High Sensitivity): 82 ng/L — ABNORMAL HIGH (ref <18)

## 2023-09-23 LAB — CBC
HCT: 28.9 % — ABNORMAL LOW (ref 36.0–46.0)
Hemoglobin: 8.7 g/dL — ABNORMAL LOW (ref 12.0–15.0)
MCH: 27.9 pg (ref 26.0–34.0)
MCHC: 30.1 g/dL (ref 30.0–36.0)
MCV: 92.6 fL (ref 80.0–100.0)
Platelets: 242 10*3/uL (ref 150–400)
RBC: 3.12 MIL/uL — ABNORMAL LOW (ref 3.87–5.11)
RDW: 15.2 % (ref 11.5–15.5)
WBC: 11.9 10*3/uL — ABNORMAL HIGH (ref 4.0–10.5)
nRBC: 0 % (ref 0.0–0.2)

## 2023-09-23 LAB — HEPATIC FUNCTION PANEL
ALT: 22 U/L (ref 0–44)
AST: 23 U/L (ref 15–41)
Albumin: 2.5 g/dL — ABNORMAL LOW (ref 3.5–5.0)
Alkaline Phosphatase: 150 U/L — ABNORMAL HIGH (ref 38–126)
Bilirubin, Direct: <0.1 mg/dL (ref 0.0–0.2)
Total Bilirubin: <0.2 mg/dL (ref <1.2)
Total Protein: 6.1 g/dL — ABNORMAL LOW (ref 6.5–8.1)

## 2023-09-23 LAB — BASIC METABOLIC PANEL
Anion gap: 12 (ref 5–15)
BUN: 59 mg/dL — ABNORMAL HIGH (ref 6–20)
CO2: 23 mmol/L (ref 22–32)
Calcium: 7.9 mg/dL — ABNORMAL LOW (ref 8.9–10.3)
Chloride: 101 mmol/L (ref 98–111)
Creatinine, Ser: 1.96 mg/dL — ABNORMAL HIGH (ref 0.44–1.00)
GFR, Estimated: 29 mL/min — ABNORMAL LOW (ref >60)
Glucose, Bld: 378 mg/dL — ABNORMAL HIGH (ref 70–99)
Potassium: 3.6 mmol/L (ref 3.5–5.1)
Sodium: 136 mmol/L (ref 135–145)

## 2023-09-23 LAB — HEMOGLOBIN AND HEMATOCRIT, BLOOD
HCT: 29.3 % — ABNORMAL LOW (ref 36.0–46.0)
Hemoglobin: 8.9 g/dL — ABNORMAL LOW (ref 12.0–15.0)

## 2023-09-23 LAB — BRAIN NATRIURETIC PEPTIDE: B Natriuretic Peptide: 468.4 pg/mL — ABNORMAL HIGH (ref 0.0–100.0)

## 2023-09-23 LAB — PROTIME-INR
INR: 1.1 (ref 0.8–1.2)
Prothrombin Time: 14.9 s (ref 11.4–15.2)

## 2023-09-23 LAB — GLUCOSE, CAPILLARY: Glucose-Capillary: 245 mg/dL — ABNORMAL HIGH (ref 70–99)

## 2023-09-23 MED ORDER — LEVOTHYROXINE SODIUM 50 MCG PO TABS
50.0000 ug | ORAL_TABLET | Freq: Every day | ORAL | Status: DC
  Administered 2023-09-23 – 2023-09-27 (×4): 50 ug via ORAL
  Filled 2023-09-23: qty 1
  Filled 2023-09-23 (×2): qty 2
  Filled 2023-09-23 (×3): qty 1

## 2023-09-23 MED ORDER — FUROSEMIDE 10 MG/ML IJ SOLN: 60.0000 mg | Freq: Two times a day (BID) | INTRAMUSCULAR | Status: DC

## 2023-09-23 MED ORDER — BUPROPION HCL ER (SR) 100 MG PO TB12
100.0000 mg | ORAL_TABLET | Freq: Every day | ORAL | Status: DC
  Administered 2023-09-23 – 2023-09-27 (×5): 100 mg via ORAL
  Filled 2023-09-23 (×5): qty 1

## 2023-09-23 MED ORDER — DILTIAZEM LOAD VIA INFUSION
10.0000 mg | Freq: Once | INTRAVENOUS | Status: AC
  Administered 2023-09-23: 10 mg via INTRAVENOUS
  Filled 2023-09-23: qty 10

## 2023-09-23 MED ORDER — METOPROLOL TARTRATE 5 MG/5ML IV SOLN
5.0000 mg | Freq: Once | INTRAVENOUS | Status: AC
  Administered 2023-09-23: 5 mg via INTRAVENOUS
  Filled 2023-09-23: qty 5

## 2023-09-23 MED ORDER — SODIUM CHLORIDE 0.9% FLUSH
3.0000 mL | Freq: Two times a day (BID) | INTRAVENOUS | Status: DC
  Administered 2023-09-24 – 2023-09-27 (×6): 3 mL via INTRAVENOUS

## 2023-09-23 MED ORDER — INSULIN ASPART 100 UNIT/ML IJ SOLN
10.0000 [IU] | Freq: Once | INTRAMUSCULAR | Status: AC
  Administered 2023-09-23: 10 [IU] via SUBCUTANEOUS

## 2023-09-23 MED ORDER — INSULIN GLARGINE-YFGN 100 UNIT/ML ~~LOC~~ SOLN
37.0000 [IU] | Freq: Every day | SUBCUTANEOUS | Status: DC
  Filled 2023-09-23: qty 0.37

## 2023-09-23 MED ORDER — ACETAMINOPHEN 650 MG RE SUPP: 650.0000 mg | Freq: Four times a day (QID) | RECTAL | Status: DC | PRN

## 2023-09-23 MED ORDER — MOMETASONE FURO-FORMOTEROL FUM 200-5 MCG/ACT IN AERO
2.0000 | INHALATION_SPRAY | Freq: Two times a day (BID) | RESPIRATORY_TRACT | Status: DC
  Administered 2023-09-23 – 2023-09-27 (×8): 2 via RESPIRATORY_TRACT
  Filled 2023-09-23: qty 8.8

## 2023-09-23 MED ORDER — APIXABAN 5 MG PO TABS
5.0000 mg | ORAL_TABLET | Freq: Two times a day (BID) | ORAL | Status: DC
  Administered 2023-09-23 – 2023-09-27 (×9): 5 mg via ORAL
  Filled 2023-09-23 (×10): qty 1

## 2023-09-23 MED ORDER — METOPROLOL TARTRATE 12.5 MG HALF TABLET
12.5000 mg | ORAL_TABLET | Freq: Two times a day (BID) | ORAL | Status: DC
  Administered 2023-09-23 – 2023-09-25 (×5): 12.5 mg via ORAL
  Filled 2023-09-23 (×5): qty 1

## 2023-09-23 MED ORDER — FUROSEMIDE 10 MG/ML IJ SOLN
40.0000 mg | Freq: Once | INTRAMUSCULAR | Status: AC
  Administered 2023-09-23: 40 mg via INTRAVENOUS
  Filled 2023-09-23: qty 4

## 2023-09-23 MED ORDER — ACETAMINOPHEN 325 MG PO TABS
650.0000 mg | ORAL_TABLET | Freq: Four times a day (QID) | ORAL | Status: DC | PRN
  Administered 2023-09-23 – 2023-09-26 (×6): 650 mg via ORAL
  Filled 2023-09-23 (×6): qty 2

## 2023-09-23 MED ORDER — FUROSEMIDE 10 MG/ML IJ SOLN
80.0000 mg | Freq: Two times a day (BID) | INTRAMUSCULAR | Status: DC
  Administered 2023-09-23 – 2023-09-26 (×7): 80 mg via INTRAVENOUS
  Filled 2023-09-23 (×7): qty 8

## 2023-09-23 MED ORDER — MORPHINE SULFATE (PF) 4 MG/ML IV SOLN
4.0000 mg | Freq: Once | INTRAVENOUS | Status: AC
  Administered 2023-09-23: 4 mg via INTRAVENOUS
  Filled 2023-09-23: qty 1

## 2023-09-23 MED ORDER — INSULIN ASPART 100 UNIT/ML IJ SOLN
10.0000 [IU] | Freq: Three times a day (TID) | INTRAMUSCULAR | Status: DC
  Administered 2023-09-23 – 2023-09-27 (×12): 10 [IU] via SUBCUTANEOUS

## 2023-09-23 MED ORDER — ALBUTEROL SULFATE (2.5 MG/3ML) 0.083% IN NEBU
2.5000 mg | INHALATION_SOLUTION | Freq: Four times a day (QID) | RESPIRATORY_TRACT | Status: DC | PRN
Start: 2023-09-23 — End: 2023-09-27

## 2023-09-23 MED ORDER — INSULIN ASPART 100 UNIT/ML IJ SOLN
0.0000 [IU] | Freq: Three times a day (TID) | INTRAMUSCULAR | Status: DC
  Administered 2023-09-23: 5 [IU] via SUBCUTANEOUS
  Administered 2023-09-23 – 2023-09-24 (×2): 9 [IU] via SUBCUTANEOUS
  Administered 2023-09-24: 7 [IU] via SUBCUTANEOUS

## 2023-09-23 MED ORDER — ROSUVASTATIN CALCIUM 20 MG PO TABS
40.0000 mg | ORAL_TABLET | Freq: Every day | ORAL | Status: DC
  Administered 2023-09-23 – 2023-09-26 (×4): 40 mg via ORAL
  Filled 2023-09-23 (×4): qty 2

## 2023-09-23 MED ORDER — PANTOPRAZOLE SODIUM 40 MG PO TBEC
40.0000 mg | DELAYED_RELEASE_TABLET | Freq: Every day | ORAL | Status: DC
  Administered 2023-09-23 – 2023-09-27 (×5): 40 mg via ORAL
  Filled 2023-09-23 (×5): qty 1

## 2023-09-23 MED ORDER — INSULIN GLARGINE-YFGN 100 UNIT/ML ~~LOC~~ SOLN
37.0000 [IU] | Freq: Every day | SUBCUTANEOUS | Status: DC
  Administered 2023-09-23: 37 [IU] via SUBCUTANEOUS
  Filled 2023-09-23 (×2): qty 0.37

## 2023-09-23 MED ORDER — DILTIAZEM HCL-DEXTROSE 125-5 MG/125ML-% IV SOLN (PREMIX)
5.0000 mg/h | INTRAVENOUS | Status: DC
  Administered 2023-09-23 (×2): 15 mg/h via INTRAVENOUS
  Administered 2023-09-23: 5 mg/h via INTRAVENOUS
  Administered 2023-09-24 (×2): 15 mg/h via INTRAVENOUS
  Administered 2023-09-25: 10 mg/h via INTRAVENOUS
  Administered 2023-09-25 (×2): 15 mg/h via INTRAVENOUS
  Administered 2023-09-26: 5 mg/h via INTRAVENOUS
  Filled 2023-09-23 (×9): qty 125

## 2023-09-23 NOTE — ED Notes (Signed)
ED Provider at bedside. 

## 2023-09-23 NOTE — ED Triage Notes (Signed)
Pt is coming in by medic from home with Afib rvr at a rate of 130 to 190, she is also hyperglycemic with a bgl of 418. She also has some wounds on her legs that are managed by wound care but they are currently open and weeping. She has some shortness of breath that is exacerbated with exertion as well as some bilateral leg swelling.   Medic vitals   20g left farearm  20mg  cardizem given en route   126/84 130-190hr 95%ra  20rr Bgl 418

## 2023-09-23 NOTE — ED Provider Notes (Signed)
[place] EMERGENCY DEPARTMENT AT [facility] Provider Note   CSN: [id number] Arrival date & time: 09/23/23  4098     History  Chief Complaint  Patient presents with   Palpitations    [name] is a 62 y.o. female.  Patient with a history of COPD, DVT, diabetes, CKD, atrial fibrillation, on Coumadin, peripheral vascular disease with lower extremity wounds here from home with chest pain.  She reports central chest pain that onset about 2 hours ago while she was resting in bed.  His pain is in the center of her chest and radiates to both arms.  Associated with shortness of breath.  EMS found her to be in A-fib with RVR rates to the 130s to 190s.  She did not know she was in A-fib.  States she is still having central chest pain that is worse with palpation.  Does feel short of breath.  No nausea or vomiting.  No fever.  No coughing.  Has chronic wounds to her lower extremities that are draining and weeping.  States compliance with her medications.  No missed doses of her blood thinner.  The history is provided by the patient and the EMS personnel.  Palpitations Associated symptoms: chest pain and shortness of breath   Associated symptoms: no nausea, no vomiting and no weakness        Home Medications Prior to Admission medications   Medication Sig Start Date End Date Taking? Authorizing Provider  ACCU-CHEK GUIDE test strip USE TO CHECK BLOOD SUGAR FOUR TIMES DAILY 02/25/20   [provider]  acetaminophen (TYLENOL) 500 MG tablet Take 1,000 mg by mouth every 6 (six) hours as needed for headache (pain).    [provider]  albuterol (VENTOLIN HFA) 108 (90 Base) MCG/ACT inhaler USE 2 PUFFS BY MOUTH EVERY FOUR HOURS, AS NEEDED, FOR COUGHING/WHEEZING 07/12/23   [name], MD  apixaban (ELIQUIS) 5 MG TABS tablet Take 1 tablet (5 mg total) by mouth 2 (two) times daily. 07/05/23   Clegg, [name] D, NP  budesonide-formoterol (SYMBICORT) 160-4.5 MCG/ACT inhaler  Inhale 2 puffs into the lungs 2 (two) times daily. 07/12/23   [name], MD  buPROPion ER (WELLBUTRIN SR) 100 MG 12 hr tablet Take 1 tablet (100 mg total) by mouth daily. 07/05/23   Clegg, [name] D, NP  Cholecalciferol (VITAMIN D3 PO) Take 1 capsule by mouth every morning.    [provider]  colchicine 0.6 MG tablet Take 1.2 mg by mouth See admin instructions. Take 1.2 mg for flair up and an hour later take 0.6 mg if needed    [provider]  doxycycline (VIBRAMYCIN) 100 MG capsule Take 100 mg by mouth 2 (two) times daily.    [provider]  EASY COMFORT PEN NEEDLES 31G X 5 MM MISC USE FOUR TIMES A DAY FOR INSULIN ADMINISTRATION 08/30/22   Georgian Co M, PA-C  insulin lispro (HUMALOG) 100 UNIT/ML KwikPen Inject 10 Units into the skin 3 (three) times daily with meals. 04/05/23   [name], MD  LANTUS SOLOSTAR 100 UNIT/ML Solostar Pen Inject 37 Units into the skin daily. Patient taking differently: Inject 40 Units into the skin daily. 04/05/23   [name], MD  levothyroxine (SYNTHROID) 50 MCG tablet TAKE 1 TABLET (50 MCG TOTAL) BY MOUTH DAILY BEFORE BREAKFAST (AM) 05/02/23   [name], MD  losartan (COZAAR) 25 MG tablet TAKE 2 TABLETS (50 MG TOTAL) BY MOUTH DAILY (AM) 08/24/23   Laurey Morale, MD  metolazone (ZAROXOLYN) 2.5 MG tablet TO BE USED AS DIRECTED BY HEART FAILURE CLINIC Patient not taking: Reported on 09/18/2023 08/16/23   Jacklynn Ganong, FNP  metoprolol tartrate (LOPRESSOR) 25 MG tablet Take 0.5 tablets (12.5 mg total) by mouth 2 (two) times daily. 08/05/23   Kathlen Mody, MD  nystatin cream (MYCOSTATIN) Apply topically 2 (two) times daily. 08/05/23   Kathlen Mody, MD  Omega-3 Fatty Acids (FISH OIL PO) Take 1 capsule by mouth every morning.    [provider]  pantoprazole (PROTONIX) 40 MG tablet TAKE 1 TABLET (40 MG TOTAL) BY MOUTH DAILY(AM) 05/02/23   [name], MD  polyethylene glycol (MIRALAX / GLYCOLAX) 17 g packet  Take 17 g by mouth daily as needed for moderate constipation. 08/05/23   Kathlen Mody, MD  rosuvastatin (CRESTOR) 40 MG tablet TAKE 1 TABLET (40 MG TOTAL) BY MOUTH DAILY (BEDTIME) 08/31/23   Cannon Kettle, PA-C  torsemide (DEMADEX) 100 MG tablet TAKE 1 TABLET (100 MG TOTAL) BY MOUTH 2 (TWO) TIMES DAILY. (AM+BEDTIME) 08/23/23   Laurey Morale, MD  triamcinolone ointment (KENALOG) 0.1 % Apply topically 2 (two) times daily. 08/09/23   [name], MD  VASCEPA 1 g capsule TAKE 2 CAPSULES (2 G TOTAL) BY MOUTH 2 (TWO) TIMES DAILY. (2N+2HS) 09/18/23   Robbie Lis M, PA-C  VITAMIN E PO Take 1 tablet by mouth daily.    [provider]  tiotropium (SPIRIVA HANDIHALER) 18 MCG inhalation capsule Place 1 capsule (18 mcg total) into inhaler and inhale every morning. Patient not taking: Reported on 02/09/2021 12/09/19 02/09/21  [name], MD      Allergies    Gabapentin and Lyrica [pregabalin]    Review of Systems   Review of Systems  Constitutional:  Negative for activity change, appetite change and fever.  HENT:  Negative for congestion and rhinorrhea.   Respiratory:  Positive for shortness of breath. Negative for chest tightness.   Cardiovascular:  Positive for chest pain, palpitations and leg swelling.  Gastrointestinal:  Negative for nausea and vomiting.  Genitourinary:  Negative for dysuria and hematuria.  Musculoskeletal:  Negative for arthralgias and myalgias.  Skin:  Negative for rash.  Neurological:  Negative for weakness and headaches.   all other systems are negative except as noted in the HPI and PMH.    Physical Exam Updated Vital Signs BP (!) 151/78   Pulse 91   Temp 97.9 F (36.6 C) (Oral)   Resp 19   Ht 5\' 4"  (1.626 m)   Wt 102.1 kg   LMP  (LMP Unknown)   SpO2 100%   BMI 38.62 kg/m  Physical Exam Vitals and nursing note reviewed.  Constitutional:      General: She is not in acute distress.    Appearance: She is well-developed. She is obese.  She is ill-appearing.  HENT:     Head: Normocephalic and atraumatic.     Mouth/Throat:     Pharynx: No oropharyngeal exudate.  Eyes:     Conjunctiva/sclera: Conjunctivae normal.     Pupils: Pupils are equal, round, and reactive to light.  Neck:     Comments: No meningismus. Cardiovascular:     Rate and Rhythm: Tachycardia present. Rhythm irregular.     Heart sounds: Normal heart sounds. No murmur heard.    Comments: Irregular rhythm 130s to 190 Pulmonary:     Effort: Pulmonary effort is normal. No respiratory distress.     Breath sounds: Normal breath sounds.  Abdominal:  Palpations: Abdomen is soft.     Tenderness: There is no abdominal tenderness. There is no guarding or rebound.  Musculoskeletal:        General: No tenderness. Normal range of motion.     Cervical back: Normal range of motion and neck supple.     Right lower leg: Edema present.     Left lower leg: Edema present.     Comments: R transmetarsal amputation  Skin:    General: Skin is warm.  Neurological:     Mental Status: She is alert and oriented to person, place, and time.     Cranial Nerves: No cranial nerve deficit.     Motor: No abnormal muscle tone.     Coordination: Coordination normal.     Comments:  5/5 strength throughout. CN 2-12 intact.Equal grip strength.   Psychiatric:        Behavior: Behavior normal.     ED Results / Procedures / Treatments   Labs (all labs ordered are listed, but only abnormal results are displayed) Labs Reviewed  BASIC METABOLIC PANEL - Abnormal; Notable for the following components:      Result Value   Glucose, Bld 378 (*)    BUN 59 (*)    Creatinine, Ser 1.96 (*)    Calcium 7.9 (*)    GFR, Estimated 29 (*)    All other components within normal limits  CBC - Abnormal; Notable for the following components:   WBC 11.9 (*)    RBC 3.12 (*)    Hemoglobin 8.7 (*)    HCT 28.9 (*)    All other components within normal limits  BRAIN NATRIURETIC PEPTIDE - Abnormal;  Notable for the following components:   B Natriuretic Peptide 468.4 (*)    All other components within normal limits  TROPONIN I (HIGH SENSITIVITY) - Abnormal; Notable for the following components:   Troponin I (High Sensitivity) 39 (*)    All other components within normal limits  PROTIME-INR  TROPONIN I (HIGH SENSITIVITY)    EKG EKG Interpretation Date/Time:  Saturday September 23 2023 02:32:44 EST Ventricular Rate:  147 PR Interval:    QRS Duration:  89 QT Interval:  284 QTC Calculation: 445 R Axis:   22  Text Interpretation: Atrial fibrillation Anteroseptal infarct, old Repol abnrm suggests ischemia, diffuse leads Atrial fibrillation with RVR   Nonspecific ST depressions laterally Confirmed by Glynn Octave [phone]) on 09/23/2023 3:11:35 AM  Radiology DG Chest Port 1 View  Result Date: 09/23/2023 CLINICAL DATA:  Atrial fibrillation and chest pain. EXAM: PORTABLE CHEST 1 VIEW COMPARISON:  July 27, 2023 FINDINGS: The cardiac silhouette is mildly enlarged and unchanged in size. Mild atelectasis is seen within the left lung base. No pleural effusion or pneumothorax is identified. The visualized skeletal structures are unremarkable. IMPRESSION: Mild cardiomegaly with mild left basilar atelectasis. Electronically Signed   By: Aram Candela M.D.   On: 09/23/2023 04:01    Procedures .Critical Care  Performed by: Glynn Octave, MD Authorized by: Glynn Octave, MD   Critical care provider statement:    Critical care time (minutes):  40   Critical care time was exclusive of:  Separately billable procedures and treating other patients   Critical care was necessary to treat or prevent imminent or life-threatening deterioration of the following conditions: atrial fibrillation with RVR.   Critical care was time spent personally by me on the following activities:  Development of treatment plan with patient or surrogate, discussions with consultants, evaluation of patient's  response to treatment, examination of patient, ordering and review of laboratory studies, ordering and review of radiographic studies, ordering and performing treatments and interventions, pulse oximetry, re-evaluation of patient's condition, review of old charts, blood draw for specimens and obtaining history from patient or surrogate   I assumed direction of critical care for this patient from another provider in my specialty: no     Care discussed with: admitting provider       Medications Ordered in ED Medications  diltiazem (CARDIZEM) 1 mg/mL load via infusion 10 mg (has no administration in time range)    And  diltiazem (CARDIZEM) 125 mg in dextrose 5% 125 mL (1 mg/mL) infusion (has no administration in time range)    ED Course/ Medical Decision Making/ A&P                                 Medical Decision Making Amount and/or Complexity of Data Reviewed Labs: ordered. Decision-making details documented in ED Course. Radiology: ordered and independent interpretation performed. Decision-making details documented in ED Course. ECG/medicine tests: ordered and independent interpretation performed. Decision-making details documented in ED Course.  Risk Prescription drug management. Decision regarding hospitalization.   Patient from home with chest pain, shortness of breath, A-fib with RVR.  EKG shows no acute ischemia but does show A-fib up to 160s.  Patient given IV Lasix as well as Cardizem infusion.  She arrives in A-fib with RVR complaining of central chest pain and pressure.  Blood pressure and mental status are stable.  States compliance with her Eliquis.  EKG shows no acute ischemia. Labs show stable creatinine and stable anemia. Troponin and BNP minimally elevated.  Heart rate improving on IV Cardizem to the 120s but remains in A-fib.  Her chest pain has improved with morphine.  Troponin minimally elevated as above.  Low suspicion for ACS.  She denies any missed doses of  Eliquis but her history seems unreliable.  She is not clear what medication she is taking. Feel unsafe to emergently cardiovert at this time given unreliability of taking her Eliquis and uncertainty of her duration of atrial fibrillation.  Would benefit from further diuresis as well as rate control.  Continue Cardizem infusion.  Given IV Lasix.  Hyperglycemia without DKA.  Admission discussed with Dr. Julian Reil.  Will alert cardiology of admission as well.       Final Clinical Impression(s) / ED Diagnoses Final diagnoses:  Atrial fibrillation with RVR ([name])  Chest pain, unspecified type    Rx / DC Orders ED Discharge Orders     None         Lurine Imel, Jeannett Senior, MD 09/23/23 [phone]

## 2023-09-23 NOTE — ED Notes (Signed)
Meal tray at bedside.  

## 2023-09-23 NOTE — H&P (Addendum)
History and Physical    Patient: [name]:[id number] DOB: 1961-03-26 DOA: 09/23/2023 DOS: the patient was seen and examined on 09/23/2023 PCP: [name], MD  Patient coming from: Home via EMS  Chief Complaint:  Chief Complaint  Patient presents with   Palpitations   HPI: [name] is a 62 y.o. female with medical history significant of hypertension, hyperlipidemia, CAD, heart failure with reduced EF, paroxysmal atrial fibrillation on Eliquis, carotid stenosis s/p left CEA, PAD s/p right TMA, COPD, diabetes mellitus type 2, CKD stage IIIb, gout, right foot ulcer, hypothyroidism, lower extremity ulcers, polysubstance abuse, and GERD who presents with complaints of chest pain.  Normally patient ambulates with the use of a wheel chair.  Symptoms started yesterday evening and was located in the center of her chest while she was in the bed.  Stated pain radiated to her arm.  Noted associated symptoms of palpitations, shortness of breath, and progressively worsening swelling over the last week.  She does not have a scale at home and reports they have been in the process of moving due to issues they were having with a nephew who was living with them previously.  [name] states that she has been taking the furosemide 100 mg twice daily.  Denies having any recent fever, chills, nausea, vomiting, diarrhea, or worsening wounds.  She is followed by wound care in the outpatient setting for ulcers of her lower extremity.  In route with EMS patient was reported to be in A-fib with RVR with heart rates 130s to 190s.  Patient had been given 20 g of Cardizem IV prior to arrival.  Upon admission into the emergency department patient was noted to be afebrile with heart rates elevated to 158 in atrial fibrillation, blood pressures 88/58 to 175/139, and O2 saturations maintained on room air.  Labs significant for WBC 11.9, hemoglobin 8.7, BUN 59, creatinine 1.96, glucose 378, anion gap 12, calcium  7.9, INR 1.1, BNP 468.4, and high-sensitivity troponin 39->82.  Chest x-ray noted mild cardiomegaly with mild left basilar atelectasis.  Patient had been started on a Cardizem drip, given furosemide 40 mg IV, morphine 4 mg IV, and metoprolol 5 mg IV.    Review of Systems: As mentioned in the history of present illness. All other systems reviewed and are negative. Past Medical History:  Diagnosis Date   Alcohol abuse    Alcoholic cirrhosis ([name])    Anemia    Anxiety    Arthritis    "knees" (11/26/2018)   B12 deficiency    CAD (coronary artery disease)    a. 11/10/2014 Cath: LM nl, LAD min irregs, D1 30 ost, D2 50d, LCX 48m, OM1 80 p/m (1.5 mm vessel), OM2 48m, RCA nondom 14m-->med rx.. Demand ischemia in the setting of rapid a-fib.   Cardiomyopathy ([name])    Carotid artery disease ([name])    a. 01/2015 Carotid Angio: RICA 100, LICA 95p; b. 02/2015 s/p L CEA; c. 05/2019 Carotid U/S: RICA 100. RECA >50. LICA 1-39%.   Cellulitis    lower extremities   CHF (congestive heart failure) ([name])    Chronic combined systolic and diastolic CHF (congestive heart failure) ([name])    a. 10/2014 Echo: EF 40-45%; b. 10/2018 Echo: EF 45-50%, gr2 DD; c. 11/2019 Echo: EF 50%, mild LVH, gr2 DD (restrictive), antlat HK, Nl RV fxn. Mild BAE. RVSP 59.20mmHg.   CKD (chronic kidney disease), stage III ([name])    Cocaine abuse ([name])    COPD (chronic obstructive pulmonary  disease) ([name])    Critical lower limb ischemia ([name])    Depression    Diabetes mellitus without complication ([name])    Diabetic peripheral neuropathy ([name])    DVT (deep venous thrombosis) ([name])    Dyspnea    Elevated troponin    a. Chronic elevation.   GERD (gastroesophageal reflux disease)    Hyperlipemia    Hypertension    Hypokalemia    Hypomagnesemia    Hypothyroidism    Marijuana abuse    Narcotic abuse ([name])    Noncompliance    NSVT (nonsustained ventricular tachycardia) ([name])    Obesity    PAF (paroxysmal atrial fibrillation) ([name])     Paroxysmal atrial tachycardia ([name])    Peripheral arterial disease ([name])    a. 01/2015 Angio/PTA: RSFA 99 (atherectomy/pta) - 1 vessel runoff via diff dzs peroneal; b. 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   Pneumonia    "once or twice" (11/26/2018)   Poorly controlled type 2 diabetes mellitus ([name])    Renal disorder    Renal insufficiency    a. 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   Sleep apnea    "couldn't handle wearing the mask" (11/26/2018)   Symptomatic bradycardia    a. Avoid AV blocking agent per EP. Prev req temp wire in 2017.   Tobacco abuse    Past Surgical History:  Procedure Laterality Date   ABDOMINAL AORTOGRAM N/A 06/26/2019   Procedure: ABDOMINAL AORTOGRAM;  Surgeon: Iran Ouch, MD;  Location: [facility];  Service: Cardiovascular;  Laterality: N/A;   ABDOMINAL AORTOGRAM W/LOWER EXTREMITY N/A 06/07/2021   Procedure: ABDOMINAL AORTOGRAM W/LOWER EXTREMITY;  Surgeon: Runell Gess, MD;  Location: [facility];  Service: Cardiovascular;  Laterality: N/A;   ABDOMINAL AORTOGRAM W/LOWER EXTREMITY N/A 08/11/2023   Procedure: ABDOMINAL AORTOGRAM W/LOWER EXTREMITY;  Surgeon: Leonie Douglas, MD;  Location: [facility];  Service: Cardiovascular;  Laterality: N/A;   AMPUTATION Right 06/14/2017   Procedure: Right foot transmetatarsal amputation;  Surgeon: Nadara Mustard, MD;  Location: [facility] OR;  Service: Orthopedics;  Laterality: Right;   AMPUTATION Left 06/28/2019   Procedure: AMPUTATION LEFT FIFTH TOE;  Surgeon: Larina Earthly, MD;  Location: [facility] OR;  Service: Vascular;  Laterality: Left;   AMPUTATION TOE Right 04/28/2017   Procedure: AMPUTATION OF RIGHT SECOND RAY;  Surgeon: Nadara Mustard, MD;  Location: [facility] OR;  Service: Orthopedics;  Laterality: Right;   CARDIAC CATHETERIZATION     CARDIAC CATHETERIZATION N/A 01/12/2016   Procedure: Temporary Wire;  Surgeon: Rollene Rotunda, MD;  Location: [facility];  Service: Cardiovascular;  Laterality: N/A;    CARDIOVERSION  ~ 02/2013   "twice"    CAROTID ANGIOGRAM N/A 01/15/2015   Procedure: CAROTID ANGIOGRAM;  Surgeon: Runell Gess, MD;  Location: [facility] CATH LAB;  Service: Cardiovascular;  Laterality: N/A;   DILATION AND CURETTAGE OF UTERUS  1988   ENDARTERECTOMY Left 02/19/2015   Procedure: LEFT CAROTID ENDARTERECTOMY ;  Surgeon: Nada Libman, MD;  Location: [facility] OR;  Service: Vascular;  Laterality: Left;   FEMORAL-TIBIAL BYPASS GRAFT Left 06/28/2019   Procedure: BYPASS GRAFT LEFT LEG FEMORAL TO ANTERIOR TIBIAL ARTERY using LEFT GREATER SAPHENOUS VEIN;  Surgeon: Larina Earthly, MD;  Location: [facility] OR;  Service: Vascular;  Laterality: Left;   LEFT HEART CATHETERIZATION WITH CORONARY ANGIOGRAM N/A 10/31/2014   Procedure: LEFT HEART CATHETERIZATION WITH CORONARY ANGIOGRAM;  Surgeon: Kathleene Hazel, MD;  Location: [facility] CATH [facility];  Service: Cardiovascular;  Laterality: N/A;   LOWER EXTREMITY ANGIOGRAM N/A 09/10/2013   Procedure: LOWER EXTREMITY ANGIOGRAM;  Surgeon: Runell Gess, MD;  Location: [facility] CATH LAB;  Service: Cardiovascular;  Laterality: N/A;   LOWER EXTREMITY ANGIOGRAM N/A 01/15/2015   Procedure: LOWER EXTREMITY ANGIOGRAM;  Surgeon: Runell Gess, MD;  Location: [facility] CATH LAB;  Service: Cardiovascular;  Laterality: N/A;   LOWER EXTREMITY ANGIOGRAPHY N/A 04/13/2017   Procedure: Lower Extremity Angiography;  Surgeon: Runell Gess, MD;  Location: [facility] INVASIVE CV LAB;  Service: Cardiovascular;  Laterality: N/A;   LOWER EXTREMITY ANGIOGRAPHY Left 06/26/2019   Procedure: LOWER EXTREMITY ANGIOGRAPHY;  Surgeon: Iran Ouch, MD;  Location: [facility];  Service: Cardiovascular;  Laterality: Left;   PERIPHERAL VASCULAR ATHERECTOMY Right 06/07/2021   Procedure: PERIPHERAL VASCULAR ATHERECTOMY;  Surgeon: Runell Gess, MD;  Location: [facility] INVASIVE CV LAB;  Service: Cardiovascular;  Laterality: Right;   PERIPHERAL VASCULAR BALLOON ANGIOPLASTY Left 06/26/2019   Procedure: PERIPHERAL  VASCULAR BALLOON ANGIOPLASTY;  Surgeon: Iran Ouch, MD;  Location: [facility];  Service: Cardiovascular;  Laterality: Left;  unable to cross lt sfa occlusion   PERIPHERAL VASCULAR INTERVENTION  04/13/2017   Procedure: Peripheral Vascular Intervention;  Surgeon: Runell Gess, MD;  Location: [facility] INVASIVE CV LAB;  Service: Cardiovascular;;   PERIPHERAL VASCULAR INTERVENTION  08/11/2023   Procedure: PERIPHERAL VASCULAR INTERVENTION;  Surgeon: Leonie Douglas, MD;  Location: [facility];  Service: Cardiovascular;;   PRESSURE SENSOR/CARDIOMEMS N/A 02/05/2020   Procedure: PRESSURE SENSOR/CARDIOMEMS;  Surgeon: Laurey Morale, MD;  Location: [facility] INVASIVE CV LAB;  Service: Cardiovascular;  Laterality: N/A;   VEIN HARVEST Left 06/28/2019   Procedure: LEFT LEG GREATER SAPHENOUS VEIN HARVEST;  Surgeon: Larina Earthly, MD;  Location: [facility] OR;  Service: Vascular;  Laterality: Left;   Social History:  reports that she has quit smoking. Her smoking use included e-cigarettes and cigarettes. She has a 44 pack-year smoking history. She has quit using smokeless tobacco.  Her smokeless tobacco use included snuff. She reports that she does not currently use alcohol. She reports that she does not currently use drugs after having used the following drugs: "Crack" cocaine, Marijuana, and Oxycodone.  Allergies  Allergen Reactions   Gabapentin Nausea And Vomiting and Other (See Comments)    POSSIBLE SHAKING   Lyrica [Pregabalin] Other (See Comments)    Shaking     Family History  Problem Relation Age of Onset   Hypertension Mother    Diabetes Mother    Cancer Mother        breast, ovarian, colon   Clotting disorder Mother    Heart disease Mother    Heart attack Mother    Breast cancer Mother        in 43's   Hypertension Father    Heart disease Father    Emphysema Sister        smoked    Prior to Admission medications   Medication Sig Start Date End Date Taking? Authorizing Provider   albuterol (VENTOLIN HFA) 108 (90 Base) MCG/ACT inhaler USE 2 PUFFS BY MOUTH EVERY FOUR HOURS, AS NEEDED, FOR COUGHING/WHEEZING 07/12/23  Yes Newlin, [name], MD  apixaban (ELIQUIS) 5 MG TABS tablet Take 1 tablet (5 mg total) by mouth 2 (two) times daily. 07/05/23  Yes Clegg, [name] D, NP  budesonide-formoterol (SYMBICORT) 160-4.5 MCG/ACT inhaler Inhale 2 puffs into the lungs 2 (two) times daily. 07/12/23  Yes  [name], MD  buPROPion ER (WELLBUTRIN SR) 100 MG 12 hr tablet Take 1 tablet (100 mg total) by mouth daily. 07/05/23  Yes Clegg, [name] D, NP  Cholecalciferol (VITAMIN D3 PO) Take 1 capsule by mouth every morning.   Yes [provider]  colchicine 0.6 MG tablet Take 1.2 mg by mouth See admin instructions. Take 1.2 mg for flair up and an hour later take 0.6 mg if needed   Yes [provider]  insulin lispro (HUMALOG) 100 UNIT/ML KwikPen Inject 10 Units into the skin 3 (three) times daily with meals. 04/05/23  Yes Newlin, Odette Horns, MD  LANTUS SOLOSTAR 100 UNIT/ML Solostar Pen Inject 37 Units into the skin daily. Patient taking differently: Inject 37 Units into the skin at bedtime. 04/05/23  Yes [name], MD  levothyroxine (SYNTHROID) 50 MCG tablet TAKE 1 TABLET (50 MCG TOTAL) BY MOUTH DAILY BEFORE BREAKFAST (AM) 05/02/23  Yes Newlin, [name], MD  losartan (COZAAR) 25 MG tablet TAKE 2 TABLETS (50 MG TOTAL) BY MOUTH DAILY (AM) 08/24/23  Yes Laurey Morale, MD  metolazone (ZAROXOLYN) 2.5 MG tablet TO BE USED AS DIRECTED BY HEART FAILURE CLINIC Patient taking differently: Take 2.5 mg by mouth as needed (for fluid retention). TO BE USED AS DIRECTED BY HEART FAILURE CLINIC 08/16/23  Yes [place], Anderson Malta, FNP  metoprolol tartrate (LOPRESSOR) 25 MG tablet Take 0.5 tablets (12.5 mg total) by mouth 2 (two) times daily. 08/05/23  Yes Kathlen Mody, MD  nystatin cream (MYCOSTATIN) Apply topically 2 (two) times daily. 08/05/23  Yes Kathlen Mody, MD  pantoprazole (PROTONIX) 40 MG tablet  TAKE 1 TABLET (40 MG TOTAL) BY MOUTH DAILY(AM) 05/02/23  Yes Newlin, [name], MD  rosuvastatin (CRESTOR) 40 MG tablet TAKE 1 TABLET (40 MG TOTAL) BY MOUTH DAILY (BEDTIME) 08/31/23  Yes Juanda Crumble K, PA-C  torsemide (DEMADEX) 100 MG tablet TAKE 1 TABLET (100 MG TOTAL) BY MOUTH 2 (TWO) TIMES DAILY. (AM+BEDTIME) 08/23/23  Yes Laurey Morale, MD  triamcinolone ointment (KENALOG) 0.1 % Apply topically 2 (two) times daily. 08/09/23  Yes Newlin, [name], MD  VASCEPA 1 g capsule TAKE 2 CAPSULES (2 G TOTAL) BY MOUTH 2 (TWO) TIMES DAILY. (2N+2HS) 09/18/23  Yes Robbie Lis M, PA-C  VITAMIN E PO Take 1 tablet by mouth daily.   Yes [provider]  ACCU-CHEK GUIDE test strip USE TO CHECK BLOOD SUGAR FOUR TIMES DAILY 02/25/20   [provider]  acetaminophen (TYLENOL) 500 MG tablet Take 1,000 mg by mouth every 6 (six) hours as needed for headache (pain).    [provider]  EASY COMFORT PEN NEEDLES 31G X 5 MM MISC USE FOUR TIMES A DAY FOR INSULIN ADMINISTRATION 08/30/22   [place] M, PA-C  polyethylene glycol (MIRALAX / GLYCOLAX) 17 g packet Take 17 g by mouth daily as needed for moderate constipation. Patient not taking: Reported on 09/23/2023 08/05/23   Kathlen Mody, MD  tiotropium (SPIRIVA HANDIHALER) 18 MCG inhalation capsule Place 1 capsule (18 mcg total) into inhaler and inhale every morning. Patient not taking: Reported on 02/09/2021 12/09/19 02/09/21  [name], MD    Physical Exam: Vitals:   09/23/23 0640 09/23/23 0645 09/23/23 0648 09/23/23 0700  BP:   99/66 118/65  Pulse: (!) 125 (!) 122 (!) 107 (!) 131  Resp: 17 (!) 23 17 16   Temp:   98 F (36.7 C)   TempSrc:   Oral   SpO2: 98% 96% 97% 94%  Weight:  Height:       Constitutional: Older adult female who appears to be in no acute distress at this time. Eyes: PERRL, lids and conjunctivae normal ENMT: Mucous membranes are moist. Posterior pharynx clear of any exudate or lesions.  Neck:  normal, supple, JVD present. Respiratory: Decreased overall aeration with crackles noted in the mid to lower lung fields. Cardiovascular: Regular irregular LE +1 pitting bilateral lower extremity edema.  Decreased pulses noted in the right foot. Abdomen: no tenderness, no masses palpated. Bowel sounds positive.  Musculoskeletal: no clubbing / cyanosis. No joint deformity upper and lower extremities. Good ROM, no contractures. Normal muscle tone.  Skin: Ulcerations noted of the right anterior shin and palmar aspect of the foot as noted in below.   Also has wound on the lateral aspect of the left foot Neurologic: CN 2-12 grossly intact. Sensation intact, DTR normal. Strength 5/5 in all 4.  Psychiatric: Normal judgment and insight. Alert and oriented x 3. Normal mood.   Data Reviewed:  EKG revealed atrial fibrillation at 147 bpm with RVR nonspecific ST depressions laterally.  Assessment and Plan:  Atrial fibrillation with RVR Patient was found to be in A-fib with RVR with heart rates reported to be 130s to 190s with EMS.  Patient was placed on a Cardizem drip and given metoprolol 5 mg IV x 1 dose.. -Admit to a progressive bed -Goal potassium at least 4 and magnesium at least 2.  Replace electrolytes as needed -Check echocardiogram -Continue Cardizem gtt -Metoprolol 12.5 mg twice daily  Chest pain Elevated troponin Acute.  Patient reported having centralized chest pain.  High-sensitivity troponins 39-> 82.  Possibly secondary to demand. -Follow-up echocardiogram  Leukocytosis Chronic.  WBC elevated at 11.9, but has been intermittently elevated over the last couple of months.  Question possibility of infection in the wounds but cannot clear. -Recheck CBC with differential in AM  Heart failure with preserved ejection fraction Acute on chronic.  Patient reported complains of increased swelling over the last week.  BNP elevated at 468.4 which is higher than priors. Last echocardiogram  noted EF to be 50 to 55% with indeterminate diastolic parameters back in April.  Patient had been given Lasix 40 mg IV. -Strict I&Os and daily weights -Lasix 80 mg IV twice daily.  Reassess diuresis in a.m. and adjust Lasix as needed -Continue beta-blocker  Uncontrolled diabetes mellitus type 2 with hyperglycemia Last hemoglobin A1c was noted to be elevated 11.1 on last checked on 07/27/2023.  Home regimen appears to include Lantus 37 units nightly and Humalog 10 units 3 times daily with meals. -Hypoglycemic protocols -Continue home regimen with pharmacy substitutions -CBGs before every meal with additional sensitive sliding scale insulin -Adjust regimen as needed  Anemia of chronic disease Hemoglobin 8.7 g/dL which appears around patient's baseline, but 1 g lower than most recent check 1 month ago.  Possibly secondary to patient being fluid overloaded. No reports of bleeding. -Recheck H&H  COPD, without acute exacerbation -Continue pharmacy substitution for Symbicort -Albuterol nebs as needed for shortness of breath/wheezing  Chronic kidney disease stage IIIb Creatinine 1.96 with BUN 59.  Baseline creatinine appears to be around 2. -Continue to monitor kidney function with diuresis  Leg ulcers Patient has leg ulcers on the right and left leg that have been present for which she is followed in outpatient setting by wound care.  Mild erythema around the wounds noted, but no significant drainage appreciated. -Continue outpatient follow-up with wound care  Peripheral vascular disease Status post  right TMA -Continue Crestor  Hypothyroidism TSH 1.038 when checked on 07/27/2023. -Continue levothyroxine  Hyperlipidemia -Continue Crestor  Tobacco abuse Patient still reports smoking half a pack cigarettes per day on average. -Continue to counsel on need of cessation of tobacco  GERD -Continue Protonix  Obesity BMI 38.62 kg/m  DVT prophylaxis: Eliquis Advance Care Planning:    Code Status: Full Code    Consults: none  Family Communication: none  Severity of Illness: The appropriate patient status for this patient is OBSERVATION. Observation status is judged to be reasonable and necessary in order to provide the required intensity of service to ensure the patient's safety. The patient's presenting symptoms, physical exam findings, and initial radiographic and laboratory data in the context of their medical condition is felt to place them at decreased risk for further clinical deterioration. Furthermore, it is anticipated that the patient will be medically stable for discharge from the hospital within 2 midnights of admission.   Author: Clydie Braun, MD 09/23/2023 8:03 AM  For on call review [email or web address].

## 2023-09-23 NOTE — ED Notes (Signed)
Patient given Metoprolol 2.5 mg IV per MD verbal order due to recent BP

## 2023-09-23 NOTE — ED Notes (Addendum)
Admitting MD notified of CBG of 489. Wants RN to give 15 units of insulin and recheck in a hour and if blood sugar is still high give the additional 4 units.

## 2023-09-23 NOTE — ED Notes (Signed)
RN aware of HR 

## 2023-09-23 NOTE — ED Notes (Signed)
Admitting MD at bedside. Aware of CBG 489, see new orders

## 2023-09-24 DIAGNOSIS — F17210 Nicotine dependence, cigarettes, uncomplicated: Secondary | ICD-10-CM | POA: Diagnosis present

## 2023-09-24 DIAGNOSIS — K7030 Alcoholic cirrhosis of liver without ascites: Secondary | ICD-10-CM | POA: Diagnosis present

## 2023-09-24 DIAGNOSIS — D631 Anemia in chronic kidney disease: Secondary | ICD-10-CM | POA: Diagnosis present

## 2023-09-24 DIAGNOSIS — E1142 Type 2 diabetes mellitus with diabetic polyneuropathy: Secondary | ICD-10-CM | POA: Diagnosis present

## 2023-09-24 DIAGNOSIS — E1165 Type 2 diabetes mellitus with hyperglycemia: Secondary | ICD-10-CM | POA: Diagnosis not present

## 2023-09-24 DIAGNOSIS — R079 Chest pain, unspecified: Secondary | ICD-10-CM | POA: Diagnosis not present

## 2023-09-24 DIAGNOSIS — J9811 Atelectasis: Secondary | ICD-10-CM | POA: Diagnosis present

## 2023-09-24 DIAGNOSIS — I5033 Acute on chronic diastolic (congestive) heart failure: Secondary | ICD-10-CM | POA: Diagnosis present

## 2023-09-24 DIAGNOSIS — E785 Hyperlipidemia, unspecified: Secondary | ICD-10-CM | POA: Diagnosis present

## 2023-09-24 DIAGNOSIS — L97929 Non-pressure chronic ulcer of unspecified part of left lower leg with unspecified severity: Secondary | ICD-10-CM | POA: Diagnosis present

## 2023-09-24 DIAGNOSIS — D72829 Elevated white blood cell count, unspecified: Secondary | ICD-10-CM | POA: Diagnosis present

## 2023-09-24 DIAGNOSIS — Z89431 Acquired absence of right foot: Secondary | ICD-10-CM | POA: Diagnosis not present

## 2023-09-24 DIAGNOSIS — D638 Anemia in other chronic diseases classified elsewhere: Secondary | ICD-10-CM | POA: Diagnosis not present

## 2023-09-24 DIAGNOSIS — I480 Paroxysmal atrial fibrillation: Secondary | ICD-10-CM | POA: Diagnosis present

## 2023-09-24 DIAGNOSIS — E669 Obesity, unspecified: Secondary | ICD-10-CM | POA: Diagnosis present

## 2023-09-24 DIAGNOSIS — Z79899 Other long term (current) drug therapy: Secondary | ICD-10-CM | POA: Diagnosis not present

## 2023-09-24 DIAGNOSIS — I4891 Unspecified atrial fibrillation: Secondary | ICD-10-CM | POA: Diagnosis present

## 2023-09-24 DIAGNOSIS — E1122 Type 2 diabetes mellitus with diabetic chronic kidney disease: Secondary | ICD-10-CM | POA: Diagnosis present

## 2023-09-24 DIAGNOSIS — E039 Hypothyroidism, unspecified: Secondary | ICD-10-CM | POA: Diagnosis present

## 2023-09-24 DIAGNOSIS — Z794 Long term (current) use of insulin: Secondary | ICD-10-CM | POA: Diagnosis not present

## 2023-09-24 DIAGNOSIS — I130 Hypertensive heart and chronic kidney disease with heart failure and stage 1 through stage 4 chronic kidney disease, or unspecified chronic kidney disease: Secondary | ICD-10-CM | POA: Diagnosis present

## 2023-09-24 DIAGNOSIS — Z79890 Hormone replacement therapy (postmenopausal): Secondary | ICD-10-CM | POA: Diagnosis not present

## 2023-09-24 DIAGNOSIS — N1831 Chronic kidney disease, stage 3a: Secondary | ICD-10-CM | POA: Diagnosis not present

## 2023-09-24 DIAGNOSIS — J449 Chronic obstructive pulmonary disease, unspecified: Secondary | ICD-10-CM | POA: Diagnosis present

## 2023-09-24 DIAGNOSIS — I5030 Unspecified diastolic (congestive) heart failure: Secondary | ICD-10-CM | POA: Diagnosis not present

## 2023-09-24 DIAGNOSIS — N179 Acute kidney failure, unspecified: Secondary | ICD-10-CM | POA: Diagnosis not present

## 2023-09-24 DIAGNOSIS — E1151 Type 2 diabetes mellitus with diabetic peripheral angiopathy without gangrene: Secondary | ICD-10-CM | POA: Diagnosis present

## 2023-09-24 DIAGNOSIS — I4819 Other persistent atrial fibrillation: Secondary | ICD-10-CM | POA: Diagnosis not present

## 2023-09-24 DIAGNOSIS — L97919 Non-pressure chronic ulcer of unspecified part of right lower leg with unspecified severity: Secondary | ICD-10-CM | POA: Diagnosis present

## 2023-09-24 DIAGNOSIS — I21A1 Myocardial infarction type 2: Secondary | ICD-10-CM | POA: Diagnosis present

## 2023-09-24 DIAGNOSIS — N1832 Chronic kidney disease, stage 3b: Secondary | ICD-10-CM | POA: Diagnosis present

## 2023-09-24 LAB — BASIC METABOLIC PANEL
Anion gap: 12 (ref 5–15)
BUN: 65 mg/dL — ABNORMAL HIGH (ref 6–20)
CO2: 24 mmol/L (ref 22–32)
Calcium: 8.4 mg/dL — ABNORMAL LOW (ref 8.9–10.3)
Chloride: 96 mmol/L — ABNORMAL LOW (ref 98–111)
Creatinine, Ser: 1.66 mg/dL — ABNORMAL HIGH (ref 0.44–1.00)
GFR, Estimated: 35 mL/min — ABNORMAL LOW (ref >60)
Glucose, Bld: 335 mg/dL — ABNORMAL HIGH (ref 70–99)
Potassium: 4.5 mmol/L (ref 3.5–5.1)
Sodium: 132 mmol/L — ABNORMAL LOW (ref 135–145)

## 2023-09-24 LAB — CBC WITH DIFFERENTIAL/PLATELET
Abs Immature Granulocytes: 0.08 10*3/uL — ABNORMAL HIGH (ref 0.00–0.07)
Basophils Absolute: 0 10*3/uL (ref 0.0–0.1)
Basophils Relative: 0 %
Eosinophils Absolute: 0.3 10*3/uL (ref 0.0–0.5)
Eosinophils Relative: 3 %
HCT: 27.1 % — ABNORMAL LOW (ref 36.0–46.0)
Hemoglobin: 8.4 g/dL — ABNORMAL LOW (ref 12.0–15.0)
Immature Granulocytes: 1 %
Lymphocytes Relative: 14 %
Lymphs Abs: 1.7 10*3/uL (ref 0.7–4.0)
MCH: 28.1 pg (ref 26.0–34.0)
MCHC: 31 g/dL (ref 30.0–36.0)
MCV: 90.6 fL (ref 80.0–100.0)
Monocytes Absolute: 0.7 10*3/uL (ref 0.1–1.0)
Monocytes Relative: 6 %
Neutro Abs: 9.4 10*3/uL — ABNORMAL HIGH (ref 1.7–7.7)
Neutrophils Relative %: 76 %
Platelets: 239 10*3/uL (ref 150–400)
RBC: 2.99 MIL/uL — ABNORMAL LOW (ref 3.87–5.11)
RDW: 15.4 % (ref 11.5–15.5)
WBC: 12.3 10*3/uL — ABNORMAL HIGH (ref 4.0–10.5)
nRBC: 0 % (ref 0.0–0.2)

## 2023-09-24 LAB — GLUCOSE, CAPILLARY
Glucose-Capillary: 341 mg/dL — ABNORMAL HIGH (ref 70–99)
Glucose-Capillary: 391 mg/dL — ABNORMAL HIGH (ref 70–99)
Glucose-Capillary: 396 mg/dL — ABNORMAL HIGH (ref 70–99)
Glucose-Capillary: 290 mg/dL — ABNORMAL HIGH (ref 70–99)

## 2023-09-24 MED ORDER — INSULIN GLARGINE-YFGN 100 UNIT/ML ~~LOC~~ SOLN
45.0000 [IU] | Freq: Every day | SUBCUTANEOUS | Status: DC
  Administered 2023-09-24 – 2023-09-26 (×3): 45 [IU] via SUBCUTANEOUS
  Filled 2023-09-24 (×4): qty 0.45

## 2023-09-24 MED ORDER — INSULIN ASPART 100 UNIT/ML IJ SOLN
0.0000 [IU] | Freq: Three times a day (TID) | INTRAMUSCULAR | Status: DC
  Administered 2023-09-24: 20 [IU] via SUBCUTANEOUS
  Administered 2023-09-25: 11 [IU] via SUBCUTANEOUS
  Administered 2023-09-25: 7 [IU] via SUBCUTANEOUS
  Administered 2023-09-26: 11 [IU] via SUBCUTANEOUS
  Administered 2023-09-26: 15 [IU] via SUBCUTANEOUS
  Administered 2023-09-27: 7 [IU] via SUBCUTANEOUS

## 2023-09-24 MED ORDER — HYDROXYZINE HCL 25 MG PO TABS
25.0000 mg | ORAL_TABLET | Freq: Four times a day (QID) | ORAL | Status: DC | PRN
  Administered 2023-09-24 – 2023-09-25 (×4): 25 mg via ORAL
  Filled 2023-09-24 (×5): qty 1

## 2023-09-24 MED ORDER — OXYCODONE HCL 5 MG PO TABS
2.5000 mg | ORAL_TABLET | Freq: Four times a day (QID) | ORAL | Status: DC | PRN
  Administered 2023-09-24 – 2023-09-27 (×7): 2.5 mg via ORAL
  Filled 2023-09-24 (×8): qty 1

## 2023-09-24 NOTE — Care Management Obs Status (Signed)
MEDICARE OBSERVATION STATUS NOTIFICATION   Patient Details  Name: [name] MRN: [id number] Date of Birth: Oct 13, 1961   Medicare Observation Status Notification Given:  Yes    Ronny Bacon, RN 09/24/2023, 9:02 AM

## 2023-09-24 NOTE — Progress Notes (Signed)
PROGRESS NOTE    [name]  VOZ:[id number] DOB: 02-11-1961 DOA: 09/23/2023 PCP: [name], MD   Brief Narrative: This 62 y.o. female with medical history significant of hypertension, hyperlipidemia, CAD, heart failure with reduced EF, paroxysmal atrial fibrillation on Eliquis, carotid stenosis s/p left CEA, PAD s/p right TMA, COPD, diabetes mellitus type 2, CKD stage IIIb, gout, right foot ulcer, hypothyroidism, lower extremity ulcers, polysubstance abuse, and GERD who presents with complaints of chest pain.  Patient reported her symptoms started yesterday evening and located in the center of the chest while she was in the bed.  Pain radiating towards left arm. She also noted associated palpitations,  shortness of breath and progressively getting worse over the last week.  She has been taking furosemide 100 mg twice daily. She also follows up with wound care for worsening wounds in his lower extremities.  Patient was found to be in A-fib with RVR with heart rates in 170s.  Patient started on IV Cardizem.  Significant labs in the ED BNP 468, troponin 39> 82.  Chest x-ray shows mild cardiomegaly with left basilar atelectasis.  Patient is admitted for further evaluation and started on Cardizem infusion, and IV lasix.  Assessment & Plan:   Active Problems:   Atrial fibrillation with RVR ([name])   Elevated troponin   Chest pain   Leukocytosis   (HFpEF) heart failure with preserved ejection fraction ([name])   Uncontrolled type 2 diabetes mellitus with hyperglycemia, with long-term current use of insulin ([name])   Anemia of chronic disease   COPD (chronic obstructive pulmonary disease) ([name])   Chronic kidney disease, stage III (moderate) ([name])   PVD (peripheral vascular disease) ([name])   Hypothyroidism   Hyperlipidemia   Tobacco abuse   Obesity (BMI 30-39.9)  Atrial fibrillation with RVR: Patient presented with chest pain, palpitations, shortness of breath,  found to have A-fib with  RVR. Patient was placed on a Cardizem drip and given metoprolol 5 mg IV x 1 dose.[name] Heart rate is now controlled. Continue metoprolol 12.5 every 12 hours. Continue Cardizem gtt. Obtain 2D echocardiogram  Chest pain: Elevated troponin likely demand ischemia: Patient reports having centralized chest pain.  High-sensitivity troponins 39-> 82.  Possibly secondary to demand. Follow-up echocardiogram.   Leukocytosis: WBC elevated at 11.9, but has been intermittently elevated over the last couple of months.   Could be due to infection in the wounds but not clear. Recheck CBC in the morning.   Heart failure with preserved ejection fraction: Acute on chronic. Patient  complains of increased swelling over the last week.   BNP elevated at 468.4 which is higher than priors. Last echocardiogram noted EF to be 50 to 55% with indeterminate diastolic parameters back in April.   Monitor Strict I&Os and daily weights Continue Lasix 80 mg IV twice daily.  Reassess diuresis in a.m. and adjust Lasix as needed Continue beta-blocker   Type 2 diabetes with hyperglycemia: Last Hb A1c was noted to be elevated 11.1 on last checked on 07/27/2023.   Home regimen appears to include Lantus 37 units nightly and Humalog 10 units 3 times daily with meals. -Hypoglycemic protocols -Continue home regimen with pharmacy substitutions. -CBGs before every meal with additional sensitive sliding scale insulin -Adjust regimen as needed   Anemia of chronic disease: Hemoglobin 8.7 g/dL which appears around patient's baseline. Possibly secondary to patient being fluid overloaded. No reports of bleeding. Monitor H&H.   COPD, without acute exacerbation: Continue Symbicort. Albuterol nebs as needed for shortness of  breath/wheezing.   Chronic kidney disease stage IIIb Serum creatinine at baseline. Continue to monitor kidney function with diuresis.   Leg ulcers: Patient has leg ulcers on the right and left leg that have  been present for which she is followed in outpatient setting by wound care.   Mild erythema around the wounds noted, but no significant drainage appreciated. -Continue outpatient follow-up with wound care   Peripheral vascular disease: Status post right TMA: Continue Crestor.   Hypothyroidism: Continue levothyroxine.   Hyperlipidemia: Continue Crestor   Tobacco abuse: Patient still reports smoking half a pack cigarettes per day on average. Continue to counsel on need of cessation of tobacco.   GERD: Continue Protonix.   Obesity: BMI 38.62 kg/m Diet and exercise discussed in detail.  DVT prophylaxis: Eliquis Code Status: Full code Family Communication:No famiy at bed side Disposition Plan:    Status is: Observation The patient remains OBS appropriate and will d/c before 2 midnights.   Admitted for chest pain, A. Fib with RVR  Consultants:  NONE  Procedures: Echocardiogram  Antimicrobials:  Anti-infectives (From admission, onward)    None      Subjective: Patient was seen and examined at bedside.  Overnight events noted.   Patient reports doing better she denies any chest pain.  Objective: Vitals:   09/23/23 2124 09/24/23 0357 09/24/23 0905 09/24/23 1313  BP: 120/79 118/85 (!) 138/100 116/80  Pulse: 75 (!) 106 (!) 111 (!) 126  Resp:  20 18 (!) 26  Temp:  97.6 F (36.4 C) 99.5 F (37.5 C) 99 F (37.2 C)  TempSrc:  Oral Oral Oral  SpO2:  95% 94% 95%  Weight:  103.9 kg    Height:        Intake/Output Summary (Last 24 hours) at 09/24/2023 1403 Last data filed at 09/24/2023 0400 Gross per 24 hour  Intake 193.2 ml  Output 250 ml  Net -56.8 ml   Filed Weights   09/23/23 0234 09/24/23 0357  Weight: 102.1 kg 103.9 kg    Examination:  General exam: Appears calm and comfortable, not in any acute distress. Respiratory system: Clear to auscultation. Respiratory effort normal.  RR 15 Cardiovascular system: S1 & S2 heard, RRR. No JVD, murmurs, rubs,  gallops or clicks. No pedal edema. Gastrointestinal system: Abdomen is non distended, soft and non tender. Normal bowel sounds heard. Central nervous system: Alert and oriented x 3. No focal neurological deficits. Extremities:  Skin: No rashes, lesions or ulcers Psychiatry: Judgement and insight appear normal. Mood & affect appropriate.     Data Reviewed: I have personally reviewed following labs and imaging studies  CBC: Recent Labs  Lab 09/23/23 0238 09/23/23 1051 09/24/23 0401  WBC 11.9*  --  12.3*  NEUTROABS  --   --  9.4*  HGB 8.7* 8.9* 8.4*  HCT 28.9* 29.3* 27.1*  MCV 92.6  --  90.6  PLT 242  --  239   Basic Metabolic Panel: Recent Labs  Lab 09/23/23 0238 09/24/23 0401  NA 136 132*  K 3.6 4.5  CL 101 96*  CO2 23 24  GLUCOSE 378* 335*  BUN 59* 65*  CREATININE 1.96* 1.66*  CALCIUM 7.9* 8.4*   GFR: Estimated Creatinine Clearance: 42.3 mL/min (A) (by C-G formula based on SCr of 1.66 mg/dL (H)). Liver Function Tests: Recent Labs  Lab 09/23/23 0238  AST 23  ALT 22  ALKPHOS 150*  BILITOT <0.2  PROT 6.1*  ALBUMIN 2.5*   No results for input(s): "LIPASE", "AMYLASE"  in the last 168 hours. No results for input(s): "AMMONIA" in the last 168 hours. Coagulation Profile: Recent Labs  Lab 09/23/23 0238  INR 1.1   Cardiac Enzymes: No results for input(s): "CKTOTAL", "CKMB", "CKMBINDEX", "TROPONINI" in the last 168 hours. BNP (last 3 results) No results for input(s): "PROBNP" in the last 8760 hours. HbA1C: No results for input(s): "HGBA1C" in the last 72 hours. CBG: Recent Labs  Lab 09/23/23 1335 09/23/23 1626 09/23/23 2114 09/24/23 0909 09/24/23 1315  GLUCAP 489* 414* 245* 341* 391*   Lipid Profile: No results for input(s): "CHOL", "HDL", "LDLCALC", "TRIG", "CHOLHDL", "LDLDIRECT" in the last 72 hours. Thyroid Function Tests: No results for input(s): "TSH", "T4TOTAL", "FREET4", "T3FREE", "THYROIDAB" in the last 72 hours. Anemia Panel: No results for  input(s): "VITAMINB12", "FOLATE", "FERRITIN", "TIBC", "IRON", "RETICCTPCT" in the last 72 hours. Sepsis Labs: No results for input(s): "PROCALCITON", "LATICACIDVEN" in the last 168 hours.  No results found for this or any previous visit (from the past 240 hour(s)).       Radiology Studies: DG Chest Port 1 View  Result Date: 09/23/2023 CLINICAL DATA:  Atrial fibrillation and chest pain. EXAM: PORTABLE CHEST 1 VIEW COMPARISON:  July 27, 2023 FINDINGS: The cardiac silhouette is mildly enlarged and unchanged in size. Mild atelectasis is seen within the left lung base. No pleural effusion or pneumothorax is identified. The visualized skeletal structures are unremarkable. IMPRESSION: Mild cardiomegaly with mild left basilar atelectasis. Electronically Signed   By: Aram Candela M.D.   On: 09/23/2023 04:01    Scheduled Meds:  apixaban  5 mg Oral BID   buPROPion ER  100 mg Oral Daily   furosemide  80 mg Intravenous BID   insulin aspart  0-20 Units Subcutaneous TID WC   insulin aspart  10 Units Subcutaneous TID WC   insulin glargine-yfgn  45 Units Subcutaneous QHS   levothyroxine  50 mcg Oral Q0600   metoprolol tartrate  12.5 mg Oral BID   mometasone-formoterol  2 puff Inhalation BID   pantoprazole  40 mg Oral Daily   rosuvastatin  40 mg Oral QHS   sodium chloride flush  3 mL Intravenous Q12H   Continuous Infusions:  diltiazem (CARDIZEM) infusion 15 mg/hr (09/24/23 0651)     LOS: 0 days    Time spent: 50 mins    Willeen Niece, MD [facility]   If 7PM-7AM, please contact night-coverage

## 2023-09-25 ENCOUNTER — Telehealth (HOSPITAL_COMMUNITY): Payer: Self-pay

## 2023-09-25 DIAGNOSIS — K7030 Alcoholic cirrhosis of liver without ascites: Secondary | ICD-10-CM | POA: Diagnosis not present

## 2023-09-25 DIAGNOSIS — I4891 Unspecified atrial fibrillation: Secondary | ICD-10-CM | POA: Diagnosis not present

## 2023-09-25 DIAGNOSIS — N1831 Chronic kidney disease, stage 3a: Secondary | ICD-10-CM

## 2023-09-25 DIAGNOSIS — I4819 Other persistent atrial fibrillation: Secondary | ICD-10-CM

## 2023-09-25 LAB — GLUCOSE, CAPILLARY
Glucose-Capillary: 222 mg/dL — ABNORMAL HIGH (ref 70–99)
Glucose-Capillary: 258 mg/dL — ABNORMAL HIGH (ref 70–99)
Glucose-Capillary: 97 mg/dL (ref 70–99)
Glucose-Capillary: 204 mg/dL — ABNORMAL HIGH (ref 70–99)

## 2023-09-25 LAB — BASIC METABOLIC PANEL
Anion gap: 8 (ref 5–15)
BUN: 60 mg/dL — ABNORMAL HIGH (ref 6–20)
CO2: 25 mmol/L (ref 22–32)
Calcium: 8.6 mg/dL — ABNORMAL LOW (ref 8.9–10.3)
Chloride: 102 mmol/L (ref 98–111)
Creatinine, Ser: 1.68 mg/dL — ABNORMAL HIGH (ref 0.44–1.00)
GFR, Estimated: 35 mL/min — ABNORMAL LOW (ref >60)
Glucose, Bld: 212 mg/dL — ABNORMAL HIGH (ref 70–99)
Potassium: 4.2 mmol/L (ref 3.5–5.1)
Sodium: 135 mmol/L (ref 135–145)

## 2023-09-25 MED ORDER — METOPROLOL TARTRATE 25 MG PO TABS
25.0000 mg | ORAL_TABLET | Freq: Three times a day (TID) | ORAL | Status: DC
  Administered 2023-09-25 (×2): 25 mg via ORAL
  Filled 2023-09-25 (×2): qty 1

## 2023-09-25 NOTE — Progress Notes (Signed)
PROGRESS NOTE    [name]  ZOX:[id number] DOB: [birth year] DOA: 09/23/2023 PCP: [name], MD   Brief Narrative: This 62 y.o. female with medical history significant of hypertension, hyperlipidemia, CAD, heart failure with reduced EF, paroxysmal atrial fibrillation on Eliquis, carotid stenosis s/p left CEA, PAD s/p right TMA, COPD, diabetes mellitus type 2, CKD stage IIIb, gout, right foot ulcer, hypothyroidism, lower extremity ulcers, polysubstance abuse, and GERD who presents with complaints of chest pain.  Patient reported her symptoms started yesterday evening and located in the center of the chest while she was in the bed.  Pain radiating towards left arm. She also noted associated palpitations,  shortness of breath and progressively getting worse over the last week.  She has been taking furosemide 100 mg twice daily. She also follows up with wound care for worsening wounds in his lower extremities.  Patient was found to be in A-fib with RVR with heart rates in 170s.  Patient started on IV Cardizem.  Significant labs in the ED BNP 468, troponin 39> 82.  Chest x-ray shows mild cardiomegaly with left basilar atelectasis.  Patient is admitted for further evaluation and started on Cardizem infusion, and IV lasix.  Cardiology is consulted.  Patient is scheduled for DCCV tomorrow if remains in A-fib.  Assessment & Plan:   Active Problems:   Atrial fibrillation with RVR ([name])   Elevated troponin   Chest pain   Leukocytosis   (HFpEF) heart failure with preserved ejection fraction ([name])   Uncontrolled type 2 diabetes mellitus with hyperglycemia, with long-term current use of insulin ([name])   Anemia of chronic disease   COPD (chronic obstructive pulmonary disease) ([name])   Chronic kidney disease, stage III (moderate) ([name])   PVD (peripheral vascular disease) ([name])   Hypothyroidism   Hyperlipidemia   Tobacco abuse   Obesity (BMI 30-39.9)  Atrial fibrillation with RVR: Patient presented  with chest pain, palpitations, shortness of breath,  found to have A-fib with RVR. Patient was placed on a Cardizem drip and given metoprolol 5 mg IV x 1 dose.[name] Heart rate is now controlled. Increase metoprolol to 25 mg every 8 hours. Continue Cardizem gtt. Obtain 2D echocardiogram: Cardiology consulted recommended DCCV tomorrow if she continues to remain in A-fib.  Chest pain: Elevated troponin likely demand ischemia: Patient reports having centralized chest pain.   High-sensitivity troponins 39-> 82.  Possibly secondary to demand. Follow-up echocardiogram.   Leukocytosis: WBC elevated at 11.9, but has been intermittently elevated over the last couple of months.   Could be due to infection in the wounds but not clear. Recheck CBC in the morning.   Heart failure with preserved ejection fraction: Acute on chronic. Patient  complains of increased swelling over the last week.   BNP elevated at 468.4 which is higher than priors. Last echocardiogram noted EF to be 50 to 55% with indeterminate diastolic parameters back in April.   Monitor Strict I&Os and daily weights Continue Lasix 80 mg IV twice daily.  Reassess diuresis in a.m. and adjust Lasix as needed Continue beta-blocker.   Type 2 diabetes with hyperglycemia: Last Hb A1c was noted to be elevated 11.1 on last checked on 07/27/2023.   Home regimen appears to include Lantus 37 units nightly and Humalog 10 units 3 times daily with meals. -Hypoglycemic protocols -Continue home regimen with pharmacy substitutions. -CBGs before every meal with additional sensitive sliding scale insulin -Adjust regimen as needed   Anemia of chronic disease: Hemoglobin 8.7 g/dL which appears around  patient's baseline. Possibly secondary to patient being fluid overloaded. No reports of bleeding. Monitor H&H.   COPD, without acute exacerbation: Continue Symbicort. Albuterol nebs as needed for shortness of breath/wheezing.   Chronic kidney disease  stage IIIb: Serum creatinine at baseline. Continue to monitor kidney function with diuresis.   Leg ulcers: Patient has leg ulcers on the right and left leg that have been present for which she is followed in outpatient setting by wound care.   Mild erythema around the wounds noted, but no significant drainage appreciated. -Continue outpatient follow-up with wound care   Peripheral vascular disease: Status post right TMA: Continue Crestor.   Hypothyroidism: Continue levothyroxine.   Hyperlipidemia: Continue Crestor   Tobacco abuse: Patient still reports smoking half a pack cigarettes per day on average. Continue to counsel on need of cessation of tobacco.   GERD: Continue Protonix.   Obesity: BMI 38.62 kg/m Diet and exercise discussed in detail.  DVT prophylaxis: Eliquis Code Status: Full code Family Communication:No famiy at bed side Disposition Plan:   Status is: Inpatient Remains inpatient appropriate because:      Admitted for chest pain, A. Fib with RVR, Cardiology consulted patient is scheduled for DCCV in the morning if remains in A-fib  Consultants:  NONE  Procedures: Echocardiogram  Antimicrobials:  Anti-infectives (From admission, onward)    None      Subjective: Patient was seen and examined at bedside.  Overnight events noted.   Patient reports doing better , She was sitting comfortably on the chair but remained in A-fib.  Objective: Vitals:   09/25/23 0748 09/25/23 0815 09/25/23 1149 09/25/23 1159  BP:   (!) 142/86   Pulse:  99 90 82  Resp: 18 20 20 18   Temp:   98.4 F (36.9 C)   TempSrc:   Oral   SpO2:   99% 98%  Weight:      Height:        Intake/Output Summary (Last 24 hours) at 09/25/2023 1505 Last data filed at 09/25/2023 0919 Gross per 24 hour  Intake 487.37 ml  Output 200 ml  Net 287.37 ml   Filed Weights   09/23/23 0234 09/24/23 0357 09/25/23 0500  Weight: 102.1 kg 103.9 kg 105.1 kg    Examination:  General  exam: Appears calm and comfortable, not in any acute distress. Respiratory system: Clear to auscultation. Respiratory effort normal.  RR 14 Cardiovascular system: S1 & S2 heard, regular rhythm, no pedal edema. Gastrointestinal system: Abdomen is non distended, soft and non tender. Normal bowel sounds heard. Central nervous system: Alert and oriented x 3. No focal neurological deficits. Extremities: No edema , no cyanosis, no clubbing Skin: No rashes, lesions or ulcers Psychiatry: Judgement and insight appear normal. Mood & affect appropriate.     Data Reviewed: I have personally reviewed following labs and imaging studies  CBC: Recent Labs  Lab 09/23/23 0238 09/23/23 1051 09/24/23 0401  WBC 11.9*  --  12.3*  NEUTROABS  --   --  9.4*  HGB 8.7* 8.9* 8.4*  HCT 28.9* 29.3* 27.1*  MCV 92.6  --  90.6  PLT 242  --  239   Basic Metabolic Panel: Recent Labs  Lab 09/23/23 0238 09/24/23 0401 09/25/23 0614  NA 136 132* 135  K 3.6 4.5 4.2  CL 101 96* 102  CO2 23 24 25   GLUCOSE 378* 335* 212*  BUN 59* 65* 60*  CREATININE 1.96* 1.66* 1.68*  CALCIUM 7.9* 8.4* 8.6*   GFR: Estimated Creatinine  Clearance: 42.1 mL/min (A) (by C-G formula based on SCr of 1.68 mg/dL (H)). Liver Function Tests: Recent Labs  Lab 09/23/23 0238  AST 23  ALT 22  ALKPHOS 150*  BILITOT <0.2  PROT 6.1*  ALBUMIN 2.5*   No results for input(s): "LIPASE", "AMYLASE" in the last 168 hours. No results for input(s): "AMMONIA" in the last 168 hours. Coagulation Profile: Recent Labs  Lab 09/23/23 0238  INR 1.1   Cardiac Enzymes: No results for input(s): "CKTOTAL", "CKMB", "CKMBINDEX", "TROPONINI" in the last 168 hours. BNP (last 3 results) No results for input(s): "PROBNP" in the last 8760 hours. HbA1C: No results for input(s): "HGBA1C" in the last 72 hours. CBG: Recent Labs  Lab 09/24/23 1315 09/24/23 1654 09/24/23 2109 09/25/23 0725 09/25/23 1148  GLUCAP 391* 396* 290* 222* 258*   Lipid  Profile: No results for input(s): "CHOL", "HDL", "LDLCALC", "TRIG", "CHOLHDL", "LDLDIRECT" in the last 72 hours. Thyroid Function Tests: No results for input(s): "TSH", "T4TOTAL", "FREET4", "T3FREE", "THYROIDAB" in the last 72 hours. Anemia Panel: No results for input(s): "VITAMINB12", "FOLATE", "FERRITIN", "TIBC", "IRON", "RETICCTPCT" in the last 72 hours. Sepsis Labs: No results for input(s): "PROCALCITON", "LATICACIDVEN" in the last 168 hours.  No results found for this or any previous visit (from the past 240 hour(s)).   Radiology Studies: No results found.  Scheduled Meds:  apixaban  5 mg Oral BID   buPROPion ER  100 mg Oral Daily   furosemide  80 mg Intravenous BID   insulin aspart  0-20 Units Subcutaneous TID WC   insulin aspart  10 Units Subcutaneous TID WC   insulin glargine-yfgn  45 Units Subcutaneous QHS   levothyroxine  50 mcg Oral Q0600   metoprolol tartrate  25 mg Oral TID   mometasone-formoterol  2 puff Inhalation BID   pantoprazole  40 mg Oral Daily   rosuvastatin  40 mg Oral QHS   sodium chloride flush  3 mL Intravenous Q12H   Continuous Infusions:  diltiazem (CARDIZEM) infusion 15 mg/hr (09/25/23 1001)     LOS: 1 day    Time spent: 35 mins    Willeen Niece, MD [facility]   If 7PM-7AM, please contact night-coverage

## 2023-09-25 NOTE — H&P (View-Only) (Signed)
Cardiology Consultation   Patient ID: [name] MRN: [id number]; DOB: Dec 10, 1960  Admit date: 09/23/2023 Date of Consult: 09/25/2023  PCP:  [name], MD   [facility] Providers Cardiologist:  Nanetta Batty, MD  Electrophysiologist:  Lanier Prude, MD  {  Patient Profile:   [name] is a 62 y.o. 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 Idelle Leech.  History of Present Illness:   [name] is followed by advanced heart failure and has previously had heart failure with mildly reduced EF however has had normalization since about 2021.  Also, previously had issues with substance abuse with cocaine and alcohol however has not done this in many years.  She has history of paroxysmal atrial fibrillation since 2017.  Previously, she had issues with symptomatic bradycardia specifically had a junctional rhythm in which AV nodal blockers were discontinued however this seems to be remote and has not been an issue in recent years.  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  Most recently in September she was admitted for UTI/sepsis with ICU level care and had underwent RLE angiogram with femoral-popliteal angioplasty and stenting.  She was last seen 08/16/2023 by advanced heart failure and had reported to have been doing overall fine.  She has a wound on her right foot and evaluated at the wound center with possible consideration of amputation.  Currently patient being admitted for A-fib RVR with heart rates in the 140s.  She  started experiencing chest pain radiating to her left arm and worsening shortness of breath and palpitations.  She has been started on IV diltiazem with max infusion with little to no improvement in heart rates.  Also noted to be volume up so she has been started on IV Lasix 80 mg twice daily without any significant urinary output.   Currently patient does not have any significant complaints of shortness of breath, orthopnea, chest pain.  She reports having an acute episode yesterday however not complaining of any now and has had no prior complaints of chest pain previously.  She does have peripheral edema that she feels is worse than from her baseline.  She also thinks she might of gained some weight and reports baseline weight being around 225.  She is 231 here.  She also reports taking torsemide 100 mg twice a day and feels that she has had decreased response to this as well as the IV Lasix.  There has been no documented I's and O's.  Otherwise does not complain of any more chest pain and does not notice her atrial fibrillation.  She was last seen normal sinus rhythm 08/16/2023.  Chest x-ray with mild left basilar atelectasis and cardiomegaly.  Creatinine 1.68.  BUN 60.  Slightly improved from admission 1.9.  BNP 468.  Troponins 39-82.  Past Medical History:  Diagnosis Date   Alcohol abuse    Alcoholic cirrhosis ([name])    Anemia    Anxiety    Arthritis    "knees" (11/26/2018)   B12 deficiency    CAD (coronary artery disease)  a. 11/10/2014 Cath: LM nl, LAD min irregs, D1 30 ost, D2 50d, LCX 65m, OM1 80 p/m (1.5 mm vessel), OM2 72m, RCA nondom 48m-->med rx.. Demand ischemia in the setting of rapid a-fib.   Cardiomyopathy ([name])    Carotid artery disease ([name])    a. 01/2015 Carotid Angio: RICA 100, LICA 95p; b. 02/2015 s/p L CEA; c. 05/2019 Carotid U/S: RICA 100. RECA >50. LICA 1-39%.   Cellulitis    lower extremities   CHF (congestive heart failure) ([name])    Chronic combined systolic and  diastolic CHF (congestive heart failure) ([name])    a. 10/2014 Echo: EF 40-45%; b. 10/2018 Echo: EF 45-50%, gr2 DD; c. 11/2019 Echo: EF 50%, mild LVH, gr2 DD (restrictive), antlat HK, Nl RV fxn. Mild BAE. RVSP 59.38mmHg.   CKD (chronic kidney disease), stage III ([name])    Cocaine abuse ([name])    COPD (chronic obstructive pulmonary disease) ([name])    Critical lower limb ischemia ([name])    Depression    Diabetes mellitus without complication ([name])    Diabetic peripheral neuropathy ([name])    DVT (deep venous thrombosis) ([name])    Dyspnea    Elevated troponin    a. Chronic elevation.   GERD (gastroesophageal reflux disease)    Hyperlipemia    Hypertension    Hypokalemia    Hypomagnesemia    Hypothyroidism    Marijuana abuse    Narcotic abuse ([name])    Noncompliance    NSVT (nonsustained ventricular tachycardia) ([name])    Obesity    PAF (paroxysmal atrial fibrillation) ([name])    Paroxysmal atrial tachycardia ([name])    Peripheral arterial disease ([name])    a. 01/2015 Angio/PTA: RSFA 99 (atherectomy/pta) - 1 vessel runoff via diff dzs peroneal; b. 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   Pneumonia    "once or twice" (11/26/2018)   Poorly controlled type 2 diabetes mellitus ([name])    Renal disorder    Renal insufficiency    a. 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   Sleep apnea    "couldn't handle wearing the mask" (11/26/2018)   Symptomatic bradycardia    a. Avoid AV blocking agent per EP. Prev req temp wire in 2017.   Tobacco abuse     Past Surgical History:  Procedure Laterality Date   ABDOMINAL AORTOGRAM N/A 06/26/2019   Procedure: ABDOMINAL AORTOGRAM;  Surgeon: Iran Ouch, MD;  Location: [facility];  Service: Cardiovascular;  Laterality: N/A;   ABDOMINAL AORTOGRAM W/LOWER EXTREMITY N/A 06/07/2021   Procedure: ABDOMINAL AORTOGRAM W/LOWER EXTREMITY;  Surgeon: Runell Gess, MD;  Location: [facility];  Service: Cardiovascular;  Laterality: N/A;   ABDOMINAL  AORTOGRAM W/LOWER EXTREMITY N/A 08/11/2023   Procedure: ABDOMINAL AORTOGRAM W/LOWER EXTREMITY;  Surgeon: Leonie Douglas, MD;  Location: [facility];  Service: Cardiovascular;  Laterality: N/A;   AMPUTATION Right 06/14/2017   Procedure: Right foot transmetatarsal amputation;  Surgeon: Nadara Mustard, MD;  Location: [facility] OR;  Service: Orthopedics;  Laterality: Right;   AMPUTATION Left 06/28/2019   Procedure: AMPUTATION LEFT FIFTH TOE;  Surgeon: Larina Earthly, MD;  Location: [facility] OR;  Service: Vascular;  Laterality: Left;   AMPUTATION TOE Right 04/28/2017   Procedure: AMPUTATION OF RIGHT SECOND RAY;  Surgeon: Nadara Mustard, MD;  Location: [facility] OR;  Service: Orthopedics;  Laterality: Right;   CARDIAC CATHETERIZATION     CARDIAC CATHETERIZATION N/A 01/12/2016   Procedure: Temporary Wire;  Surgeon: Rollene Rotunda, MD;  Location: [facility] INVASIVE CV LAB;  Service: Cardiovascular;  Laterality: N/A;   CARDIOVERSION  ~ 02/2013   "twice"    CAROTID ANGIOGRAM N/A 01/15/2015   Procedure: CAROTID ANGIOGRAM;  Surgeon: Runell Gess, MD;  Location: [facility] CATH LAB;  Service: Cardiovascular;  Laterality: N/A;   DILATION AND CURETTAGE OF UTERUS  1988   ENDARTERECTOMY Left 02/19/2015   Procedure: LEFT CAROTID ENDARTERECTOMY ;  Surgeon: Nada Libman, MD;  Location: [facility] OR;  Service: Vascular;  Laterality: Left;   FEMORAL-TIBIAL BYPASS GRAFT Left 06/28/2019   Procedure: BYPASS GRAFT LEFT LEG FEMORAL TO ANTERIOR TIBIAL ARTERY using LEFT GREATER SAPHENOUS VEIN;  Surgeon: Larina Earthly, MD;  Location: [facility] OR;  Service: Vascular;  Laterality: Left;   LEFT HEART CATHETERIZATION WITH CORONARY ANGIOGRAM N/A 10/31/2014   Procedure: LEFT HEART CATHETERIZATION WITH CORONARY ANGIOGRAM;  Surgeon: Kathleene Hazel, MD;  Location: [facility] CATH LAB;  Service: Cardiovascular;  Laterality: N/A;   LOWER EXTREMITY ANGIOGRAM N/A 09/10/2013   Procedure: LOWER EXTREMITY ANGIOGRAM;  Surgeon: Runell Gess, MD;  Location: [facility] CATH LAB;  Service:  Cardiovascular;  Laterality: N/A;   LOWER EXTREMITY ANGIOGRAM N/A 01/15/2015   Procedure: LOWER EXTREMITY ANGIOGRAM;  Surgeon: Runell Gess, MD;  Location: [facility] CATH LAB;  Service: Cardiovascular;  Laterality: N/A;   LOWER EXTREMITY ANGIOGRAPHY N/A 04/13/2017   Procedure: Lower Extremity Angiography;  Surgeon: Runell Gess, MD;  Location: [facility] INVASIVE CV LAB;  Service: Cardiovascular;  Laterality: N/A;   LOWER EXTREMITY ANGIOGRAPHY Left 06/26/2019   Procedure: LOWER EXTREMITY ANGIOGRAPHY;  Surgeon: Iran Ouch, MD;  Location: [facility];  Service: Cardiovascular;  Laterality: Left;   PERIPHERAL VASCULAR ATHERECTOMY Right 06/07/2021   Procedure: PERIPHERAL VASCULAR ATHERECTOMY;  Surgeon: Runell Gess, MD;  Location: [facility] INVASIVE CV LAB;  Service: Cardiovascular;  Laterality: Right;   PERIPHERAL VASCULAR BALLOON ANGIOPLASTY Left 06/26/2019   Procedure: PERIPHERAL VASCULAR BALLOON ANGIOPLASTY;  Surgeon: Iran Ouch, MD;  Location: [facility];  Service: Cardiovascular;  Laterality: Left;  unable to cross lt sfa occlusion   PERIPHERAL VASCULAR INTERVENTION  04/13/2017   Procedure: Peripheral Vascular Intervention;  Surgeon: Runell Gess, MD;  Location: [facility] INVASIVE CV LAB;  Service: Cardiovascular;;   PERIPHERAL VASCULAR INTERVENTION  08/11/2023   Procedure: PERIPHERAL VASCULAR INTERVENTION;  Surgeon: Leonie Douglas, MD;  Location: [facility];  Service: Cardiovascular;;   PRESSURE SENSOR/CARDIOMEMS N/A 02/05/2020   Procedure: PRESSURE SENSOR/CARDIOMEMS;  Surgeon: Laurey Morale, MD;  Location: [facility] INVASIVE CV LAB;  Service: Cardiovascular;  Laterality: N/A;   VEIN HARVEST Left 06/28/2019   Procedure: LEFT LEG GREATER SAPHENOUS VEIN HARVEST;  Surgeon: Larina Earthly, MD;  Location: [facility] OR;  Service: Vascular;  Laterality: Left;     Inpatient Medications: Scheduled Meds:  apixaban  5 mg Oral BID   buPROPion ER  100 mg Oral Daily   furosemide  80 mg  Intravenous BID   insulin aspart  0-20 Units Subcutaneous TID WC   insulin aspart  10 Units Subcutaneous TID WC   insulin glargine-yfgn  45 Units Subcutaneous QHS   levothyroxine  50 mcg Oral Q0600   metoprolol tartrate  12.5 mg Oral BID   mometasone-formoterol  2 puff Inhalation BID   pantoprazole  40 mg Oral Daily   rosuvastatin  40 mg Oral QHS   sodium chloride flush  3 mL Intravenous Q12H   Continuous Infusions:  diltiazem (CARDIZEM) infusion 15 mg/hr (  09/25/23 1001)   PRN Meds: acetaminophen **OR** acetaminophen, albuterol, hydrOXYzine, oxyCODONE  Allergies:    Allergies  Allergen Reactions   Gabapentin Nausea And Vomiting and Other (See Comments)    POSSIBLE SHAKING   Lyrica [Pregabalin] Other (See Comments)    Shaking     Social History:   Social History   Socioeconomic History   Marital status: Single    Spouse name: Not on file   Number of children: 1   Years of education: 12   Highest education level: 12th grade  Occupational History   Occupation: disabled  Tobacco Use   Smoking status: Former    Current packs/day: 1.00    Average packs/day: 1 pack/day for 44.0 years (44.0 ttl pk-yrs)    Types: E-cigarettes, Cigarettes   Smokeless tobacco: Former    Types: Snuff  Vaping Use   Vaping status: Former   Devices: 11/26/2018 "stopped months ago"  Substance and Sexual Activity   Alcohol use: Not Currently    Comment: occ   Drug use: Not Currently    Types: "Crack" cocaine, Marijuana, Oxycodone   Sexual activity: Not Currently  Other Topics Concern   Not on file  Social History Narrative   ** Merged History Encounter **       Lives in [place], in motel with sister.  They are looking to move but don't have a place to go yet.     Social Determinants of Health   Financial Resource Strain: Low Risk  (02/09/2023)   Overall Financial Resource Strain (CARDIA)    Difficulty of Paying Living Expenses: Not hard at all  Food Insecurity: No Food Insecurity  (09/23/2023)   Hunger Vital Sign    Worried About Running Out of Food in the Last Year: Never true    Ran Out of Food in the Last Year: Never true  Transportation Needs: No Transportation Needs (09/23/2023)   PRAPARE - Administrator, Civil Service (Medical): No    Lack of Transportation (Non-Medical): No  Physical Activity: Inactive (02/09/2023)   Exercise Vital Sign    Days of Exercise per Week: 0 days    Minutes of Exercise per Session: 0 min  Stress: No Stress Concern Present (02/09/2023)   [employer] of Occupational Health - Occupational Stress Questionnaire    Feeling of Stress : Not at all  Social Connections: Unknown (08/21/2023)   [facility]   Social Network    Social Network: Not on file  Intimate Partner Violence: Not At Risk (09/23/2023)   Humiliation, Afraid, Rape, and Kick questionnaire    Fear of Current or Ex-Partner: No    Emotionally Abused: No    Physically Abused: No    Sexually Abused: No    Family History:   Family History  Problem Relation Age of Onset   Hypertension Mother    Diabetes Mother    Cancer Mother        breast, ovarian, colon   Clotting disorder Mother    Heart disease Mother    Heart attack Mother    Breast cancer Mother        in 59's   Hypertension Father    Heart disease Father    Emphysema Sister        smoked     ROS:  Please see the history of present illness.  All other ROS reviewed and negative.     Physical Exam/Data:   Vitals:   09/25/23 0600 09/25/23 0631 09/25/23  0728 09/25/23 1149  BP:   130/66 (!) 142/86  Pulse:  88 82 90  Resp:  (!) 23 20 20   Temp:   98 F (36.7 C) 98.4 F (36.9 C)  TempSrc:   Oral Oral  SpO2: 93% 95% 94% 99%  Weight:      Height:        Intake/Output Summary (Last 24 hours) at 09/25/2023 1158 Last data filed at 09/25/2023 0919 Gross per 24 hour  Intake 487.37 ml  Output 200 ml  Net 287.37 ml      09/25/2023    5:00 AM 09/24/2023    3:57 AM  09/23/2023    2:34 AM  Last 3 Weights  Weight (lbs) 231 lb 12.8 oz 229 lb 0.9 oz 225 lb  Weight (kg) 105.144 kg 103.9 kg 102.059 kg     Body mass index is 39.79 kg/m.  General: Morbidly obese HEENT: normal Neck: Difficult to assess JVD Vascular: No carotid bruits; Distal pulses 2+ bilaterally Cardiac: Irregular irregular Lungs:  clear to auscultation bilaterally, no wheezing, rhonchi or rales.  Diminished Abd: soft, nontender, no hepatomegaly  Ext: Tight but no pitting edema Musculoskeletal:  No deformities, BUE and BLE strength normal and equal Skin: warm and dry  Neuro:  CNs 2-12 intact, no focal abnormalities noted Psych:  Normal affect   EKG:  The EKG was personally reviewed and demonstrates: Atrial fibrillation, heart rate 147.  Slight ST downsloping laterally. Telemetry:  Telemetry was personally reviewed and demonstrates: Atrial fibrillation heart rates between 100 140  Relevant CV Studies: Echo 03/02/2023 1. Left ventricular ejection fraction, by estimation, is 50 to 55% . The left ventricle has low normal function. The left ventricle has no regional wall motion abnormalities. Left ventricular diastolic parameters are indeterminate. 2. Right ventricular systolic function is normal. The right ventricular size is normal. There is normal pulmonary artery systolic pressure. 3. The mitral valve is normal in structure. Trivial mitral valve regurgitation. No evidence of mitral stenosis. 4. The aortic valve is normal in structure. Aortic valve regurgitation is not visualized. No aortic stenosis is present. 5. The inferior vena cava is normal in size with greater than 50% respiratory variability, suggesting right atrial pressure of 3 mmHg.  Laboratory Data:  High Sensitivity Troponin:   Recent Labs  Lab 09/23/23 0238 09/23/23 0537  TROPONINIHS 39* 82*     Chemistry Recent Labs  Lab 09/23/23 0238 09/24/23 0401 09/25/23 0614  NA 136 132* 135  K 3.6 4.5 4.2  CL 101 96* 102   CO2 23 24 25   GLUCOSE 378* 335* 212*  BUN 59* 65* 60*  CREATININE 1.96* 1.66* 1.68*  CALCIUM 7.9* 8.4* 8.6*  GFRNONAA 29* 35* 35*  ANIONGAP 12 12 8     Recent Labs  Lab 09/23/23 0238  PROT 6.1*  ALBUMIN 2.5*  AST 23  ALT 22  ALKPHOS 150*  BILITOT <0.2   Lipids No results for input(s): "CHOL", "TRIG", "HDL", "LABVLDL", "LDLCALC", "CHOLHDL" in the last 168 hours.  Hematology Recent Labs  Lab 09/23/23 0238 09/23/23 1051 09/24/23 0401  WBC 11.9*  --  12.3*  RBC 3.12*  --  2.99*  HGB 8.7* 8.9* 8.4*  HCT 28.9* 29.3* 27.1*  MCV 92.6  --  90.6  MCH 27.9  --  28.1  MCHC 30.1  --  31.0  RDW 15.2  --  15.4  PLT 242  --  239   Thyroid No results for input(s): "TSH", "FREET4" in the last 168 hours.  BNP Recent Labs  Lab 09/23/23 0237  BNP 468.4*    DDimer No results for input(s): "DDIMER" in the last 168 hours.   Radiology/Studies:  DG Chest Port 1 View  Result Date: 09/23/2023 CLINICAL DATA:  Atrial fibrillation and chest pain. EXAM: PORTABLE CHEST 1 VIEW COMPARISON:  July 27, 2023 FINDINGS: The cardiac silhouette is mildly enlarged and unchanged in size. Mild atelectasis is seen within the left lung base. No pleural effusion or pneumothorax is identified. The visualized skeletal structures are unremarkable. IMPRESSION: Mild cardiomegaly with mild left basilar atelectasis. Electronically Signed   By: Aram Candela M.D.   On: 09/23/2023 04:01     Assessment and Plan:   Paroxysmal atrial fibrillation Suspect her chest pain is more related to RVR given mildly elevated troponin's 39-82 and timing of onset.  Uncontrolled rates between 100-140.  Started on IV diltiazem max infusion with no significant improvement.  Has had previous history of symptomatic bradycardia so need to be mindful of this.  She reports 100% compliancy with her Eliquis.  Last noted to be in sinus rhythm 08/16/2023. Likely need to plan for DCCV given minimal improvement with rate control. Continue  Eliquis 5 mg twice daily.  Will increase her metoprolol to 25 TID from 12.5 mg twice daily Normal TSH 2 months ago Can repeat echo once back in sinus  Informed Consent   Shared Decision Making/Informed Consent{  The risks (stroke, cardiac arrhythmias rarely resulting in the need for a temporary or permanent pacemaker, skin irritation or burns and complications associated with conscious sedation including aspiration, arrhythmia, respiratory failure and death), benefits (restoration of normal sinus rhythm) and alternatives of a direct current cardioversion were explained in detail to [name] and she agrees to proceed.   Heart failure with recovered EF Previously depressed likely in the setting of substance abuse but since about 2021 this has normalized.  Chronically has issues with peripheral edema that has worsened but no shortness of breath/orthopnea.  Reports her torsemide dosing has decreased efficacy.  She was started on IV Lasix 80 mg twice daily, she has no documented I's and O's.  Also reports this has not done much, however creatinine appears to have improved.   Can continue IV Lasix 80 mg twice daily for now, need to to reassess whether this is a sufficient dosing for her.  Unclear if her peripheral edema has improved from this.  Follow weights and I's and O's.  Volume status is difficult to assess.  PTA was on torsemide 100 mg twice daily. Reports baseline weight being around 225.  Here she is 231.  CKD Creatinine actually appears to be improved from previous admission.  Creatinine on admission 1.96.  Currently 1.6 today.  Type 2 diabetes A1c 11.1%.  Needs better control.    PAD status post right TMA Has had multiple interventions in the past.  Also recently seen in September with femoral-popliteal angioplasty and stenting followed by Dr. Allyson Sabal.  Continue rosuvastatin 40 mg, LDL 46.  Bilateral leg ulcers Followed by wound care.   Nicotine dependence 1 pack/day.  Continue to  encourage cessation.  Reports that she cannot afford nicotine patches.  COPD Per primary team.  Prior substance abuse Prior alcohol abuse/cirrhosis Sober for several years now.  New York Heart Association (NYHA) Functional Class NYHA Class II  CHA2DS2-VASc Score = 5  This indicates a 7.2% annual risk of stroke. The patient's score is based upon: CHF History: 1 HTN History: 1 Diabetes History: 1 Stroke History:  0 Vascular Disease History: 1 Age Score: 0 Gender Score: 1    For questions or updates, please contact [facility] Please consult [email or web address] for contact info under    Signed, APPHIA PLAGMAN, PA-C  09/25/2023 11:58 AM   I have seen and examined the patient along with Karla Kitchens, PA-C.  I have reviewed the chart, notes and new data.  I agree with PA/NP's note.  Key new complaints: not really aware of palpitations anymore, denies angina and dyspnea, but feels like she is "hyperventilating" Key examination changes: rapid irregular rhythm, a few scattered wheezes, A&O x3, but may have a little asterixis Key new findings / data: increase beta blockers (already maxed on IV diltiazem dose), but if we cannot achieve good rate control or she has worsening wheezing, we will do better with early DCCV. She is anticoagulated and has not missed any doses of anticoagulant. Would like to avoid amio with chronic liver disease.  PLAN: DCCV tomorrow if still in AFib w RVR. She agrees (she has previously had a DCCV about 10 years ago). If she has ERAF, may be best suited for early ablation strategy rather than antiarrhythmic meds due to liver and kidney comorbidity.  Thurmon Fair, MD, [facility] [phone] 09/25/2023, 12:39 PM

## 2023-09-25 NOTE — Progress Notes (Signed)
Dear Doctor: This patient has been identified as a candidate for PICC for the following reason (s): drug pH or osmolality (causing phlebitis, infiltration in 24 hours), drug extravasation potential with tissue necrosis (KCL, Dilantin, Dopamine, CaCl, MgSO4, chemo vesicant), poor veins/poor circulatory system (CHF, COPD, emphysema, diabetes, steroid use, IV drug abuse, etc.), and restarts due to phlebitis and infiltration in 24 hours If you agree, please write an order for the indicated device.  Thank you for supporting the early vascular access assessment program.

## 2023-09-25 NOTE — Consult Note (Addendum)
Cardiology Consultation   Patient ID: [name] MRN: [id number]; DOB: Dec 10, 1960  Admit date: 09/23/2023 Date of Consult: 09/25/2023  PCP:  [name], MD   [facility] Providers Cardiologist:  Nanetta Batty, MD  Electrophysiologist:  Lanier Prude, MD  {  Patient Profile:   [name] is a 62 y.o. 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 Idelle Leech.  History of Present Illness:   [name] is followed by advanced heart failure and has previously had heart failure with mildly reduced EF however has had normalization since about 2021.  Also, previously had issues with substance abuse with cocaine and alcohol however has not done this in many years.  She has history of paroxysmal atrial fibrillation since 2017.  Previously, she had issues with symptomatic bradycardia specifically had a junctional rhythm in which AV nodal blockers were discontinued however this seems to be remote and has not been an issue in recent years.  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  Most recently in September she was admitted for UTI/sepsis with ICU level care and had underwent RLE angiogram with femoral-popliteal angioplasty and stenting.  She was last seen 08/16/2023 by advanced heart failure and had reported to have been doing overall fine.  She has a wound on her right foot and evaluated at the wound center with possible consideration of amputation.  Currently patient being admitted for A-fib RVR with heart rates in the 140s.  She  started experiencing chest pain radiating to her left arm and worsening shortness of breath and palpitations.  She has been started on IV diltiazem with max infusion with little to no improvement in heart rates.  Also noted to be volume up so she has been started on IV Lasix 80 mg twice daily without any significant urinary output.   Currently patient does not have any significant complaints of shortness of breath, orthopnea, chest pain.  She reports having an acute episode yesterday however not complaining of any now and has had no prior complaints of chest pain previously.  She does have peripheral edema that she feels is worse than from her baseline.  She also thinks she might of gained some weight and reports baseline weight being around 225.  She is 231 here.  She also reports taking torsemide 100 mg twice a day and feels that she has had decreased response to this as well as the IV Lasix.  There has been no documented I's and O's.  Otherwise does not complain of any more chest pain and does not notice her atrial fibrillation.  She was last seen normal sinus rhythm 08/16/2023.  Chest x-ray with mild left basilar atelectasis and cardiomegaly.  Creatinine 1.68.  BUN 60.  Slightly improved from admission 1.9.  BNP 468.  Troponins 39-82.  Past Medical History:  Diagnosis Date   Alcohol abuse    Alcoholic cirrhosis ([name])    Anemia    Anxiety    Arthritis    "knees" (11/26/2018)   B12 deficiency    CAD (coronary artery disease)  a. 11/10/2014 Cath: LM nl, LAD min irregs, D1 30 ost, D2 50d, LCX 65m, OM1 80 p/m (1.5 mm vessel), OM2 72m, RCA nondom 48m-->med rx.. Demand ischemia in the setting of rapid a-fib.   Cardiomyopathy ([name])    Carotid artery disease ([name])    a. 01/2015 Carotid Angio: RICA 100, LICA 95p; b. 02/2015 s/p L CEA; c. 05/2019 Carotid U/S: RICA 100. RECA >50. LICA 1-39%.   Cellulitis    lower extremities   CHF (congestive heart failure) ([name])    Chronic combined systolic and  diastolic CHF (congestive heart failure) ([name])    a. 10/2014 Echo: EF 40-45%; b. 10/2018 Echo: EF 45-50%, gr2 DD; c. 11/2019 Echo: EF 50%, mild LVH, gr2 DD (restrictive), antlat HK, Nl RV fxn. Mild BAE. RVSP 59.38mmHg.   CKD (chronic kidney disease), stage III ([name])    Cocaine abuse ([name])    COPD (chronic obstructive pulmonary disease) ([name])    Critical lower limb ischemia ([name])    Depression    Diabetes mellitus without complication ([name])    Diabetic peripheral neuropathy ([name])    DVT (deep venous thrombosis) ([name])    Dyspnea    Elevated troponin    a. Chronic elevation.   GERD (gastroesophageal reflux disease)    Hyperlipemia    Hypertension    Hypokalemia    Hypomagnesemia    Hypothyroidism    Marijuana abuse    Narcotic abuse ([name])    Noncompliance    NSVT (nonsustained ventricular tachycardia) ([name])    Obesity    PAF (paroxysmal atrial fibrillation) ([name])    Paroxysmal atrial tachycardia ([name])    Peripheral arterial disease ([name])    a. 01/2015 Angio/PTA: RSFA 99 (atherectomy/pta) - 1 vessel runoff via diff dzs peroneal; b. 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   Pneumonia    "once or twice" (11/26/2018)   Poorly controlled type 2 diabetes mellitus ([name])    Renal disorder    Renal insufficiency    a. 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   Sleep apnea    "couldn't handle wearing the mask" (11/26/2018)   Symptomatic bradycardia    a. Avoid AV blocking agent per EP. Prev req temp wire in 2017.   Tobacco abuse     Past Surgical History:  Procedure Laterality Date   ABDOMINAL AORTOGRAM N/A 06/26/2019   Procedure: ABDOMINAL AORTOGRAM;  Surgeon: Iran Ouch, MD;  Location: [facility] IN[facility]IVE [facility] LAB;  Service: Cardiovascular;  Laterality: N/A;   ABDOMINAL AORTOGRAM W/LOWER EXTREMITY N/A 06/07/2021   Procedure: ABDOMINAL AORTOGRAM W/LOWER EXTREMITY;  Surgeon: Runell Gess, MD;  Location: [facility];  Service: Cardiovascular;  Laterality: N/A;   ABDOMINAL  AORTOGRAM W/LOWER EXTREMITY N/A 08/11/2023   Procedure: ABDOMINAL AORTOGRAM W/LOWER EXTREMITY;  Surgeon: Leonie Douglas, MD;  Location: [facility];  Service: Cardiovascular;  Laterality: N/A;   AMPUTATION Right 06/14/2017   Procedure: Right foot transmetatarsal amputation;  Surgeon: Nadara Mustard, MD;  Location: [facility] OR;  Service: Orthopedics;  Laterality: Right;   AMPUTATION Left 06/28/2019   Procedure: AMPUTATION LEFT FIFTH TOE;  Surgeon: Larina Earthly, MD;  Location: [facility] OR;  Service: Vascular;  Laterality: Left;   AMPUTATION TOE Right 04/28/2017   Procedure: AMPUTATION OF RIGHT SECOND RAY;  Surgeon: Nadara Mustard, MD;  Location: [facility] OR;  Service: Orthopedics;  Laterality: Right;   CARDIAC CATHETERIZATION     CARDIAC CATHETERIZATION N/A 01/12/2016   Procedure: Temporary Wire;  Surgeon: Rollene Rotunda, MD;  Location: [facility] INVASIVE CV LAB;  Service: Cardiovascular;  Laterality: N/A;   CARDIOVERSION  ~ 02/2013   "twice"    CAROTID ANGIOGRAM N/A 01/15/2015   Procedure: CAROTID ANGIOGRAM;  Surgeon: Runell Gess, MD;  Location: [facility] CATH LAB;  Service: Cardiovascular;  Laterality: N/A;   DILATION AND CURETTAGE OF UTERUS  1988   ENDARTERECTOMY Left 02/19/2015   Procedure: LEFT CAROTID ENDARTERECTOMY ;  Surgeon: Nada Libman, MD;  Location: [facility] OR;  Service: Vascular;  Laterality: Left;   FEMORAL-TIBIAL BYPASS GRAFT Left 06/28/2019   Procedure: BYPASS GRAFT LEFT LEG FEMORAL TO ANTERIOR TIBIAL ARTERY using LEFT GREATER SAPHENOUS VEIN;  Surgeon: Larina Earthly, MD;  Location: [facility] OR;  Service: Vascular;  Laterality: Left;   LEFT HEART CATHETERIZATION WITH CORONARY ANGIOGRAM N/A 10/31/2014   Procedure: LEFT HEART CATHETERIZATION WITH CORONARY ANGIOGRAM;  Surgeon: Kathleene Hazel, MD;  Location: [facility] CATH LAB;  Service: Cardiovascular;  Laterality: N/A;   LOWER EXTREMITY ANGIOGRAM N/A 09/10/2013   Procedure: LOWER EXTREMITY ANGIOGRAM;  Surgeon: Runell Gess, MD;  Location: [facility] CATH LAB;  Service:  Cardiovascular;  Laterality: N/A;   LOWER EXTREMITY ANGIOGRAM N/A 01/15/2015   Procedure: LOWER EXTREMITY ANGIOGRAM;  Surgeon: Runell Gess, MD;  Location: [facility] CATH LAB;  Service: Cardiovascular;  Laterality: N/A;   LOWER EXTREMITY ANGIOGRAPHY N/A 04/13/2017   Procedure: Lower Extremity Angiography;  Surgeon: Runell Gess, MD;  Location: [facility] INVASIVE CV LAB;  Service: Cardiovascular;  Laterality: N/A;   LOWER EXTREMITY ANGIOGRAPHY Left 06/26/2019   Procedure: LOWER EXTREMITY ANGIOGRAPHY;  Surgeon: Iran Ouch, MD;  Location: [facility];  Service: Cardiovascular;  Laterality: Left;   PERIPHERAL VASCULAR ATHERECTOMY Right 06/07/2021   Procedure: PERIPHERAL VASCULAR ATHERECTOMY;  Surgeon: Runell Gess, MD;  Location: [facility] INVASIVE CV LAB;  Service: Cardiovascular;  Laterality: Right;   PERIPHERAL VASCULAR BALLOON ANGIOPLASTY Left 06/26/2019   Procedure: PERIPHERAL VASCULAR BALLOON ANGIOPLASTY;  Surgeon: Iran Ouch, MD;  Location: [facility];  Service: Cardiovascular;  Laterality: Left;  unable to cross lt sfa occlusion   PERIPHERAL VASCULAR INTERVENTION  04/13/2017   Procedure: Peripheral Vascular Intervention;  Surgeon: Runell Gess, MD;  Location: [facility] INVASIVE CV LAB;  Service: Cardiovascular;;   PERIPHERAL VASCULAR INTERVENTION  08/11/2023   Procedure: PERIPHERAL VASCULAR INTERVENTION;  Surgeon: Leonie Douglas, MD;  Location: [facility];  Service: Cardiovascular;;   PRESSURE SENSOR/CARDIOMEMS N/A 02/05/2020   Procedure: PRESSURE SENSOR/CARDIOMEMS;  Surgeon: Laurey Morale, MD;  Location: [facility] INVASIVE CV LAB;  Service: Cardiovascular;  Laterality: N/A;   VEIN HARVEST Left 06/28/2019   Procedure: LEFT LEG GREATER SAPHENOUS VEIN HARVEST;  Surgeon: Larina Earthly, MD;  Location: [facility] OR;  Service: Vascular;  Laterality: Left;     Inpatient Medications: Scheduled Meds:  apixaban  5 mg Oral BID   buPROPion ER  100 mg Oral Daily   furosemide  80 mg  Intravenous BID   insulin aspart  0-20 Units Subcutaneous TID WC   insulin aspart  10 Units Subcutaneous TID WC   insulin glargine-yfgn  45 Units Subcutaneous QHS   levothyroxine  50 mcg Oral Q0600   metoprolol tartrate  12.5 mg Oral BID   mometasone-formoterol  2 puff Inhalation BID   pantoprazole  40 mg Oral Daily   rosuvastatin  40 mg Oral QHS   sodium chloride flush  3 mL Intravenous Q12H   Continuous Infusions:  diltiazem (CARDIZEM) infusion 15 mg/hr (  09/25/23 1001)   PRN Meds: acetaminophen **OR** acetaminophen, albuterol, hydrOXYzine, oxyCODONE  Allergies:    Allergies  Allergen Reactions   Gabapentin Nausea And Vomiting and Other (See Comments)    POSSIBLE SHAKING   Lyrica [Pregabalin] Other (See Comments)    Shaking     Social History:   Social History   Socioeconomic History   Marital status: Single    Spouse name: Not on file   Number of children: 1   Years of education: 12   Highest education level: 12th grade  Occupational History   Occupation: disabled  Tobacco Use   Smoking status: Former    Current packs/day: 1.00    Average packs/day: 1 pack/day for 44.0 years (44.0 ttl pk-yrs)    Types: E-cigarettes, Cigarettes   Smokeless tobacco: Former    Types: Snuff  Vaping Use   Vaping status: Former   Devices: 11/26/2018 "stopped months ago"  Substance and Sexual Activity   Alcohol use: Not Currently    Comment: occ   Drug use: Not Currently    Types: "Crack" cocaine, Marijuana, Oxycodone   Sexual activity: Not Currently  Other Topics Concern   Not on file  Social History Narrative   ** Merged History Encounter **       Lives in [place], in motel with sister.  They are looking to move but don't have a place to go yet.     Social Determinants of Health   Financial Resource Strain: Low Risk  (02/09/2023)   Overall Financial Resource Strain (CARDIA)    Difficulty of Paying Living Expenses: Not hard at all  Food Insecurity: No Food Insecurity  (09/23/2023)   Hunger Vital Sign    Worried About Running Out of Food in the Last Year: Never true    Ran Out of Food in the Last Year: Never true  Transportation Needs: No Transportation Needs (09/23/2023)   PRAPARE - Administrator, Civil Service (Medical): No    Lack of Transportation (Non-Medical): No  Physical Activity: Inactive (02/09/2023)   Exercise Vital Sign    Days of Exercise per Week: 0 days    Minutes of Exercise per Session: 0 min  Stress: No Stress Concern Present (02/09/2023)   [employer] of Occupational Health - Occupational Stress Questionnaire    Feeling of Stress : Not at all  Social Connections: Unknown (08/21/2023)   [facility]   Social Network    Social Network: Not on file  Intimate Partner Violence: Not At Risk (09/23/2023)   Humiliation, Afraid, Rape, and Kick questionnaire    Fear of Current or Ex-Partner: No    Emotionally Abused: No    Physically Abused: No    Sexually Abused: No    Family History:   Family History  Problem Relation Age of Onset   Hypertension Mother    Diabetes Mother    Cancer Mother        breast, ovarian, colon   Clotting disorder Mother    Heart disease Mother    Heart attack Mother    Breast cancer Mother        in 59's   Hypertension Father    Heart disease Father    Emphysema Sister        smoked     ROS:  Please see the history of present illness.  All other ROS reviewed and negative.     Physical Exam/Data:   Vitals:   09/25/23 0600 09/25/23 0631 09/25/23  0728 09/25/23 1149  BP:   130/66 (!) 142/86  Pulse:  88 82 90  Resp:  (!) 23 20 20   Temp:   98 F (36.7 C) 98.4 F (36.9 C)  TempSrc:   Oral Oral  SpO2: 93% 95% 94% 99%  Weight:      Height:        Intake/Output Summary (Last 24 hours) at 09/25/2023 1158 Last data filed at 09/25/2023 0919 Gross per 24 hour  Intake 487.37 ml  Output 200 ml  Net 287.37 ml      09/25/2023    5:00 AM 09/24/2023    3:57 AM  09/23/2023    2:34 AM  Last 3 Weights  Weight (lbs) 231 lb 12.8 oz 229 lb 0.9 oz 225 lb  Weight (kg) 105.144 kg 103.9 kg 102.059 kg     Body mass index is 39.79 kg/m.  General: Morbidly obese HEENT: normal Neck: Difficult to assess JVD Vascular: No carotid bruits; Distal pulses 2+ bilaterally Cardiac: Irregular irregular Lungs:  clear to auscultation bilaterally, no wheezing, rhonchi or rales.  Diminished Abd: soft, nontender, no hepatomegaly  Ext: Tight but no pitting edema Musculoskeletal:  No deformities, BUE and BLE strength normal and equal Skin: warm and dry  Neuro:  CNs 2-12 intact, no focal abnormalities noted Psych:  Normal affect   EKG:  The EKG was personally reviewed and demonstrates: Atrial fibrillation, heart rate 147.  Slight ST downsloping laterally. Telemetry:  Telemetry was personally reviewed and demonstrates: Atrial fibrillation heart rates between 100 140  Relevant CV Studies: Echo 03/02/2023 1. Left ventricular ejection fraction, by estimation, is 50 to 55% . The left ventricle has low normal function. The left ventricle has no regional wall motion abnormalities. Left ventricular diastolic parameters are indeterminate. 2. Right ventricular systolic function is normal. The right ventricular size is normal. There is normal pulmonary artery systolic pressure. 3. The mitral valve is normal in structure. Trivial mitral valve regurgitation. No evidence of mitral stenosis. 4. The aortic valve is normal in structure. Aortic valve regurgitation is not visualized. No aortic stenosis is present. 5. The inferior vena cava is normal in size with greater than 50% respiratory variability, suggesting right atrial pressure of 3 mmHg.  Laboratory Data:  High Sensitivity Troponin:   Recent Labs  Lab 09/23/23 0238 09/23/23 0537  TROPONINIHS 39* 82*     Chemistry Recent Labs  Lab 09/23/23 0238 09/24/23 0401 09/25/23 0614  NA 136 132* 135  K 3.6 4.5 4.2  CL 101 96* 102   CO2 23 24 25   GLUCOSE 378* 335* 212*  BUN 59* 65* 60*  CREATININE 1.96* 1.66* 1.68*  CALCIUM 7.9* 8.4* 8.6*  GFRNONAA 29* 35* 35*  ANIONGAP 12 12 8     Recent Labs  Lab 09/23/23 0238  PROT 6.1*  ALBUMIN 2.5*  AST 23  ALT 22  ALKPHOS 150*  BILITOT <0.2   Lipids No results for input(s): "CHOL", "TRIG", "HDL", "LABVLDL", "LDLCALC", "CHOLHDL" in the last 168 hours.  Hematology Recent Labs  Lab 09/23/23 0238 09/23/23 1051 09/24/23 0401  WBC 11.9*  --  12.3*  RBC 3.12*  --  2.99*  HGB 8.7* 8.9* 8.4*  HCT 28.9* 29.3* 27.1*  MCV 92.6  --  90.6  MCH 27.9  --  28.1  MCHC 30.1  --  31.0  RDW 15.2  --  15.4  PLT 242  --  239   Thyroid No results for input(s): "TSH", "FREET4" in the last 168 hours.  BNP Recent Labs  Lab 09/23/23 0237  BNP 468.4*    DDimer No results for input(s): "DDIMER" in the last 168 hours.   Radiology/Studies:  DG Chest Port 1 View  Result Date: 09/23/2023 CLINICAL DATA:  Atrial fibrillation and chest pain. EXAM: PORTABLE CHEST 1 VIEW COMPARISON:  July 27, 2023 FINDINGS: The cardiac silhouette is mildly enlarged and unchanged in size. Mild atelectasis is seen within the left lung base. No pleural effusion or pneumothorax is identified. The visualized skeletal structures are unremarkable. IMPRESSION: Mild cardiomegaly with mild left basilar atelectasis. Electronically Signed   By: Aram Candela M.D.   On: 09/23/2023 04:01     Assessment and Plan:   Paroxysmal atrial fibrillation Suspect her chest pain is more related to RVR given mildly elevated troponin's 39-82 and timing of onset.  Uncontrolled rates between 100-140.  Started on IV diltiazem max infusion with no significant improvement.  Has had previous history of symptomatic bradycardia so need to be mindful of this.  She reports 100% compliancy with her Eliquis.  Last noted to be in sinus rhythm 08/16/2023. Likely need to plan for DCCV given minimal improvement with rate control. Continue  Eliquis 5 mg twice daily.  Will increase her metoprolol to 25 TID from 12.5 mg twice daily Normal TSH 2 months ago Can repeat echo once back in sinus  Informed Consent   Shared Decision Making/Informed Consent{  The risks (stroke, cardiac arrhythmias rarely resulting in the need for a temporary or permanent pacemaker, skin irritation or burns and complications associated with conscious sedation including aspiration, arrhythmia, respiratory failure and death), benefits (restoration of normal sinus rhythm) and alternatives of a direct current cardioversion were explained in detail to [name] and she agrees to proceed.   Heart failure with recovered EF Previously depressed likely in the setting of substance abuse but since about 2021 this has normalized.  Chronically has issues with peripheral edema that has worsened but no shortness of breath/orthopnea.  Reports her torsemide dosing has decreased efficacy.  She was started on IV Lasix 80 mg twice daily, she has no documented I's and O's.  Also reports this has not done much, however creatinine appears to have improved.   Can continue IV Lasix 80 mg twice daily for now, need to to reassess whether this is a sufficient dosing for her.  Unclear if her peripheral edema has improved from this.  Follow weights and I's and O's.  Volume status is difficult to assess.  PTA was on torsemide 100 mg twice daily. Reports baseline weight being around 225.  Here she is 231.  CKD Creatinine actually appears to be improved from previous admission.  Creatinine on admission 1.96.  Currently 1.6 today.  Type 2 diabetes A1c 11.1%.  Needs better control.    PAD status post right TMA Has had multiple interventions in the past.  Also recently seen in September with femoral-popliteal angioplasty and stenting followed by Dr. Allyson Sabal.  Continue rosuvastatin 40 mg, LDL 46.  Bilateral leg ulcers Followed by wound care.   Nicotine dependence 1 pack/day.  Continue to  encourage cessation.  Reports that she cannot afford nicotine patches.  COPD Per primary team.  Prior substance abuse Prior alcohol abuse/cirrhosis Sober for several years now.  New York Heart Association (NYHA) Functional Class NYHA Class II  CHA2DS2-VASc Score = 5  This indicates a 7.2% annual risk of stroke. The patient's score is based upon: CHF History: 1 HTN History: 1 Diabetes History: 1 Stroke History:  0 Vascular Disease History: 1 Age Score: 0 Gender Score: 1    For questions or updates, please contact [facility] Please consult [email or web address] for contact info under    Signed, APPHIA PLAGMAN, PA-C  09/25/2023 11:58 AM   I have seen and examined the patient along with Abagail Kitchens, PA-C.  I have reviewed the chart, notes and new data.  I agree with PA/NP's note.  Key new complaints: not really aware of palpitations anymore, denies angina and dyspnea, but feels like she is "hyperventilating" Key examination changes: rapid irregular rhythm, a few scattered wheezes, A&O x3, but may have a little asterixis Key new findings / data: increase beta blockers (already maxed on IV diltiazem dose), but if we cannot achieve good rate control or she has worsening wheezing, we will do better with early DCCV. She is anticoagulated and has not missed any doses of anticoagulant. Would like to avoid amio with chronic liver disease.  PLAN: DCCV tomorrow if still in AFib w RVR. She agrees (she has previously had a DCCV about 10 years ago). If she has ERAF, may be best suited for early ablation strategy rather than antiarrhythmic meds due to liver and kidney comorbidity.  Thurmon Fair, MD, [facility] [phone] 09/25/2023, 12:39 PM

## 2023-09-25 NOTE — Inpatient Diabetes Management (Signed)
Inpatient Diabetes Program Recommendations  AACE/ADA: New Consensus Statement on Inpatient Glycemic Control (2015)  Target Ranges:  Prepandial:   less than 140 mg/dL      Peak postprandial:   less than 180 mg/dL (1-2 hours)      Critically ill patients:  140 - 180 mg/dL   Lab Results  Component Value Date   GLUCAP 258 (H) 09/25/2023   HGBA1C 11.1 (H) 07/27/2023    Review of Glycemic Control  Diabetes history: type 2 Outpatient Diabetes medications: Lantus 37 units at HS, Humalog 7 units TID (per patient) Current orders for Inpatient glycemic control: Semglee 45 units at HS, Novolog 0-20 units correction scale TID, Novolog 10 units TID  Inpatient Diabetes Program Recommendations:   Spoke briefly with patient on the phone. Patient was diagnosed with diabetes for about 10 years. States that she takes her Lantus and Humalog everyday. She will be seeing her PCP next week after discharge. Discussed her HgbA1C of 11.1% and she states that it has been that high for a while. States that she does check her blood sugars with a Freestyle Libre CGM and uses a reader. The reader has been acting slow to give the results when the CGM is scanned. She may need a new reader. She will talk with her PCP about this.   Will continue to follow blood sugars while in the hospital.  Smith Mince RN BSN CDE Diabetes Coordinator Pager: [phone]  8am-5pm

## 2023-09-25 NOTE — Progress Notes (Signed)
Arrived to patient's room. Patient sitting up in chair. Notified nurse to place consult when patient is back in bed and ready for IV team. Anders Grant, RN VAST

## 2023-09-25 NOTE — Plan of Care (Signed)
  Problem: Coping: Goal: Ability to adjust to condition or change in health will improve Outcome: Progressing   Problem: Health Behavior/Discharge Planning: Goal: Ability to manage health-related needs will improve Outcome: Progressing   Problem: Education: Goal: Knowledge of General Education information will improve Description: Including pain rating scale, medication(s)/side effects and non-pharmacologic comfort measures Outcome: Progressing   Problem: Clinical Measurements: Goal: Ability to maintain clinical measurements within normal limits will improve Outcome: Progressing Goal: Respiratory complications will improve Outcome: Progressing   Problem: Skin Integrity: Goal: Risk for impaired skin integrity will decrease Outcome: Not Progressing   Problem: Clinical Measurements: Goal: Cardiovascular complication will be avoided Outcome: Not Progressing   Problem: Pain Management: Goal: General experience of comfort will improve Outcome: Not Progressing

## 2023-09-25 NOTE — Telephone Encounter (Signed)
Contacted [facility] to request the following for refills:   Eliquis  Losartan   Kerry Hough, Paramedic [phone] 09/25/23

## 2023-09-25 NOTE — Anesthesia Preprocedure Evaluation (Signed)
Anesthesia Evaluation  Patient identified by MRN, date of birth, ID band Patient awake    Reviewed: Allergy & Precautions, H&P , NPO status , Patient's Chart, lab work & pertinent test results  Airway Mallampati: II  TM Distance: >3 FB Neck ROM: Full    Dental no notable dental hx.    Pulmonary sleep apnea , COPD, Patient abstained from smoking., former smoker   Pulmonary exam normal breath sounds clear to auscultation       Cardiovascular hypertension, + CAD, + Peripheral Vascular Disease and +CHF  Normal cardiovascular exam+ dysrhythmias Atrial Fibrillation  Rhythm:Regular Rate:Normal  IMPRESSIONS     1. Left ventricular ejection fraction, by estimation, is 50 to 55%. The  left ventricle has low normal function. The left ventricle has no regional  wall motion abnormalities. Left ventricular diastolic parameters are  indeterminate.   2. Right ventricular systolic function is normal. The right ventricular  size is normal. There is normal pulmonary artery systolic pressure.   3. The mitral valve is normal in structure. Trivial mitral valve  regurgitation. No evidence of mitral stenosis.   4. The aortic valve is normal in structure. Aortic valve regurgitation is  not visualized. No aortic stenosis is present.   5. The inferior vena cava is normal in size with greater than 50%  respiratory variability, suggesting right atrial pressure of 3 mmHg.     Neuro/Psych  PSYCHIATRIC DISORDERS Anxiety Depression    negative neurological ROS     GI/Hepatic Neg liver ROS,GERD  ,,  Endo/Other  diabetesHypothyroidism    Renal/GU Renal disease  negative genitourinary   Musculoskeletal  (+) Arthritis ,    Abdominal   Peds negative pediatric ROS (+)  Hematology  (+) Blood dyscrasia, anemia   Anesthesia Other Findings   Reproductive/Obstetrics negative OB ROS                             Anesthesia  Physical Anesthesia Plan  ASA: 3  Anesthesia Plan: General   Post-op Pain Management:    Induction: Intravenous  PONV Risk Score and Plan: Treatment may vary due to age or medical condition  Airway Management Planned: Natural Airway and Nasal Cannula  Additional Equipment:   Intra-op Plan:   Post-operative Plan:   Informed Consent: I have reviewed the patients History and Physical, chart, labs and discussed the procedure including the risks, benefits and alternatives for the proposed anesthesia with the patient or authorized representative who has indicated his/her understanding and acceptance.     Dental advisory given  Plan Discussed with: CRNA  Anesthesia Plan Comments:        Anesthesia Quick Evaluation

## 2023-09-25 NOTE — Progress Notes (Signed)
Midline was ordered for this patient. However, upon assessment with [place] to bilateral upper arms. No appropriate veins found, d/t depth or diameter. PIV placed and nurse notified that PICC line is recommended if further access is needed. RN VU. Tomasita Morrow, RN VAST

## 2023-09-25 NOTE — Progress Notes (Signed)
Heart Failure Navigator Progress Note  Assessed for Heart & Vascular TOC clinic readiness.  Patient does not meet criteria due to Advanced Heart Failure Team patient of Dr. McLean.   Navigator will sign off at this time.   Mahreen Schewe, BSN, RN Heart Failure Nurse Navigator Secure Chat Only   

## 2023-09-26 ENCOUNTER — Encounter (HOSPITAL_COMMUNITY)
Admission: EM | Disposition: A | Discharge status: Home / Self Care | Payer: Self-pay | Source: Home / Self Care | Attending: Family Medicine

## 2023-09-26 ENCOUNTER — Inpatient Hospital Stay (HOSPITAL_COMMUNITY): Payer: 59

## 2023-09-26 DIAGNOSIS — I480 Paroxysmal atrial fibrillation: Secondary | ICD-10-CM | POA: Diagnosis not present

## 2023-09-26 DIAGNOSIS — I4891 Unspecified atrial fibrillation: Secondary | ICD-10-CM | POA: Diagnosis not present

## 2023-09-26 DIAGNOSIS — I4819 Other persistent atrial fibrillation: Secondary | ICD-10-CM | POA: Diagnosis not present

## 2023-09-26 DIAGNOSIS — I5033 Acute on chronic diastolic (congestive) heart failure: Secondary | ICD-10-CM | POA: Diagnosis not present

## 2023-09-26 HISTORY — PX: CARDIOVERSION: EP1203

## 2023-09-26 LAB — BASIC METABOLIC PANEL
Anion gap: 12 (ref 5–15)
BUN: 61 mg/dL — ABNORMAL HIGH (ref 6–20)
CO2: 24 mmol/L (ref 22–32)
Calcium: 8.5 mg/dL — ABNORMAL LOW (ref 8.9–10.3)
Chloride: 98 mmol/L (ref 98–111)
Creatinine, Ser: 1.59 mg/dL — ABNORMAL HIGH (ref 0.44–1.00)
GFR, Estimated: 37 mL/min — ABNORMAL LOW (ref >60)
Glucose, Bld: 156 mg/dL — ABNORMAL HIGH (ref 70–99)
Potassium: 4 mmol/L (ref 3.5–5.1)
Sodium: 134 mmol/L — ABNORMAL LOW (ref 135–145)

## 2023-09-26 LAB — GLUCOSE, CAPILLARY
Glucose-Capillary: 173 mg/dL — ABNORMAL HIGH (ref 70–99)
Glucose-Capillary: 260 mg/dL — ABNORMAL HIGH (ref 70–99)
Glucose-Capillary: 360 mg/dL — ABNORMAL HIGH (ref 70–99)
Glucose-Capillary: 316 mg/dL — ABNORMAL HIGH (ref 70–99)
Glucose-Capillary: 224 mg/dL — ABNORMAL HIGH (ref 70–99)

## 2023-09-26 LAB — CBC
HCT: 27.4 % — ABNORMAL LOW (ref 36.0–46.0)
Hemoglobin: 8.4 g/dL — ABNORMAL LOW (ref 12.0–15.0)
MCH: 27.9 pg (ref 26.0–34.0)
MCHC: 30.7 g/dL (ref 30.0–36.0)
MCV: 91 fL (ref 80.0–100.0)
Platelets: 261 10*3/uL (ref 150–400)
RBC: 3.01 MIL/uL — ABNORMAL LOW (ref 3.87–5.11)
RDW: 15.6 % — ABNORMAL HIGH (ref 11.5–15.5)
WBC: 13.3 10*3/uL — ABNORMAL HIGH (ref 4.0–10.5)
nRBC: 0 % (ref 0.0–0.2)

## 2023-09-26 LAB — HEMOGLOBIN A1C
Hgb A1c MFr Bld: 12 % — ABNORMAL HIGH (ref 4.8–5.6)
Mean Plasma Glucose: 298 mg/dL

## 2023-09-26 LAB — MAGNESIUM: Magnesium: 2.1 mg/dL (ref 1.7–2.4)

## 2023-09-26 LAB — PHOSPHORUS: Phosphorus: 3.6 mg/dL (ref 2.5–4.6)

## 2023-09-26 SURGERY — CARDIOVERSION (CATH LAB)
Anesthesia: General

## 2023-09-26 MED ORDER — PROPOFOL 10 MG/ML IV BOLUS
INTRAVENOUS | Status: DC | PRN
  Administered 2023-09-26: 40 mg via INTRAVENOUS

## 2023-09-26 MED ORDER — METOPROLOL SUCCINATE ER 25 MG PO TB24
25.0000 mg | ORAL_TABLET | Freq: Every day | ORAL | Status: DC
  Administered 2023-09-26 – 2023-09-27 (×2): 25 mg via ORAL
  Filled 2023-09-26 (×2): qty 1

## 2023-09-26 MED ORDER — SODIUM CHLORIDE 0.9 % IV SOLN: INTRAVENOUS | Status: DC

## 2023-09-26 SURGICAL SUPPLY — 1 items: PAD DEFIB RADIO PHYSIO CONN (PAD) ×1 IMPLANT

## 2023-09-26 NOTE — Progress Notes (Addendum)
Patient Name: [name] Date of Encounter: 09/26/2023 [facility] Cardiologist: Nanetta Batty, MD   Interval Summary  .    Cardioverted today successfully to normal sinus rhythm.  States that she feels much better and that her swelling has improved.  We do not have any output but she states that she is urinating well.  Vital Signs .    Vitals:   09/26/23 0815 09/26/23 0820 09/26/23 0825 09/26/23 0845  BP: 107/62 (!) 108/56 107/62 137/78  Pulse: 66 66 66   Resp: (!) 22 20 (!) 21   Temp:    99.1 F (37.3 C)  TempSrc:      SpO2: 97% 96% 91%   Weight:      Height:        Intake/Output Summary (Last 24 hours) at 09/26/2023 0919 Last data filed at 09/26/2023 0700 Gross per 24 hour  Intake 510.96 ml  Output --  Net 510.96 ml      09/26/2023    4:17 AM 09/25/2023    5:00 AM 09/24/2023    3:57 AM  Last 3 Weights  Weight (lbs) 224 lb 14.4 oz 231 lb 12.8 oz 229 lb 0.9 oz  Weight (kg) 102.014 kg 105.144 kg 103.9 kg      Telemetry/ECG    Normal sinus rhythm heart rates in the 80s- Personally Reviewed  CV Studies    Echocardiogram 03/02/2023 1. Left ventricular ejection fraction, by estimation, is 50 to 55%. The  left ventricle has low normal function. The left ventricle has no regional  wall motion abnormalities. Left ventricular diastolic parameters are  indeterminate.   2. Right ventricular systolic function is normal. The right ventricular  size is normal. There is normal pulmonary artery systolic pressure.   3. The mitral valve is normal in structure. Trivial mitral valve  regurgitation. No evidence of mitral stenosis.   4. The aortic valve is normal in structure. Aortic valve regurgitation is  not visualized. No aortic stenosis is present.   5. The inferior vena cava is normal in size with greater than 50%  respiratory variability, suggesting right atrial pressure of 3 mmHg.   Physical Exam .   GEN: No acute distress.   Neck: Difficult to  assess JVD Cardiac: RRR, no murmurs, rubs, or gallops.  Respiratory: Clear to auscultation bilaterally. GI: Soft, nontender, non-distended  MS: Tight, no pitting edema.  Improving  Patient Profile    [name] is a 62 y.o. 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     Paroxysmal atrial fibrillation Initially presented with uncontrolled rates between 100-140.  Started on IV diltiazem max infusion with no significant improvement.  Today she underwent successful cardioversion to normal sinus rhythm maintaining heart rates in the 80s.  Feels significantly better. Continue Eliquis 5 mg twice daily.  Will transition her to Toprol-XL 25 mg daily.  Normal TSH 2 months ago  Heart failure with recovered EF Previously depressed likely in the setting of substance abuse but since about 2021 this has normalized.  Chronically has issues with peripheral edema that has worsened but no shortness of  breath/orthopnea.  Reports her torsemide dosing has decreased efficacy.  She was started on IV Lasix 80 mg twice daily, she has no documented I's and O's.  Reports this has helped her swelling. Continue IV Lasix 80 mg twice daily.  Volume status is difficult to assess.  PTA was on torsemide 100 mg twice daily. Reports baseline weight being around 216.  She was 231 yesterday, today she is 224 Will repeat echocardiogram now back in sinus   CKD Creatinine actually appears to be improved from previous admission.  Creatinine on admission 1.96.  Currently 1.68>1.59   Type 2 diabetes A1c 11.1%.  Needs better control.     PAD  status post right TMA Has had multiple interventions in the past.  Also recently seen in September with femoral-popliteal angioplasty and stenting followed by Dr. Allyson Sabal.  Continue rosuvastatin 40 mg, LDL 46.   Bilateral leg ulcers Followed by wound care.    Nicotine dependence 1 pack/day.  Continue to encourage cessation.  Reports that she cannot afford nicotine patches.   COPD Per primary team.   Prior substance abuse Prior alcohol abuse/cirrhosis Sober for several years now.   For questions or updates, please contact [facility] Please consult [email or web address] for contact info under        Signed, Abagail Kitchens, PA-C    I have seen and examined the patient along with Abagail Kitchens, PA-C  .  I have reviewed the chart, notes and new data.  I agree with PA/NP's note.  Key new complaints: still sleepy after DCCV Key examination changes: RRR, + LE edema Key new findings / data: NSR on telemetry. Steadily improving renal function.  PLAN: Anticipate she will be ready for DC after a little more diuresis. Watch for development of encephalopathy - unusually sleepy several hours after sedation.  Thurmon Fair, MD, [facility] [phone] 09/26/2023, 12:52 PM

## 2023-09-26 NOTE — Plan of Care (Signed)
  Problem: Metabolic: Goal: Ability to maintain appropriate glucose levels will improve Outcome: Progressing   Problem: Nutritional: Goal: Maintenance of adequate nutrition will improve Outcome: Progressing   Problem: Education: Goal: Knowledge of General Education information will improve Description: Including pain rating scale, medication(s)/side effects and non-pharmacologic comfort measures Outcome: Progressing   Problem: Clinical Measurements: Goal: Ability to maintain clinical measurements within normal limits will improve Outcome: Progressing Goal: Will remain free from infection Outcome: Progressing Goal: Diagnostic test results will improve Outcome: Progressing Goal: Cardiovascular complication will be avoided Outcome: Progressing   Problem: Education: Goal: Ability to describe self-care measures that may prevent or decrease complications (Diabetes Survival Skills Education) will improve Outcome: Not Progressing   Problem: Skin Integrity: Goal: Risk for impaired skin integrity will decrease Outcome: Not Progressing

## 2023-09-26 NOTE — Plan of Care (Signed)
  Problem: Coping: Goal: Ability to adjust to condition or change in health will improve Outcome: Progressing   Problem: Fluid Volume: Goal: Ability to maintain a balanced intake and output will improve Outcome: Progressing

## 2023-09-26 NOTE — Plan of Care (Signed)

## 2023-09-26 NOTE — Transfer of Care (Signed)
Immediate Anesthesia Transfer of Care Note  Patient: [name]  Procedure(s) Performed: CARDIOVERSION (CATH LAB)  Patient Location: Cath Lab  Anesthesia Type:General  Level of Consciousness: awake  Airway & Oxygen Therapy: Patient Spontanous Breathing and Patient connected to nasal cannula oxygen  Post-op Assessment: Report given to RN and Post -op Vital signs reviewed and stable  Post vital signs: Reviewed and stable  Last Vitals:  Vitals Value Taken Time  BP 123/66 09/26/23 0750  Temp    Pulse 106 09/26/23 0750  Resp 26 09/26/23 0750  SpO2 96 % 09/26/23 0750  Vitals shown include unfiled device data.  Last Pain:  Vitals:   09/26/23 0218  TempSrc:   PainSc: 5       Patients Stated Pain Goal: 0 (09/26/23 0134)  Complications: No notable events documented.

## 2023-09-26 NOTE — Anesthesia Postprocedure Evaluation (Signed)
Anesthesia Post Note  Patient: [name]  Procedure(s) Performed: CARDIOVERSION (CATH LAB)     Patient location during evaluation: PACU Anesthesia Type: General Level of consciousness: awake and alert Pain management: pain level controlled Vital Signs Assessment: post-procedure vital signs reviewed and stable Respiratory status: spontaneous breathing, nonlabored ventilation, respiratory function stable and patient connected to nasal cannula oxygen Cardiovascular status: blood pressure returned to baseline and stable Postop Assessment: no apparent nausea or vomiting Anesthetic complications: no   No notable events documented.  Last Vitals:  Vitals:   09/26/23 0820 09/26/23 0825  BP: (!) 108/56 107/62  Pulse: 66 66  Resp: 20 (!) 21  Temp:    SpO2: 96% 91%    Last Pain:  Vitals:   09/26/23 0811  TempSrc: Temporal  PainSc: 0-No pain                 Lisco Nation

## 2023-09-26 NOTE — Progress Notes (Signed)
PROGRESS NOTE    [name]  ONG:[id number] DOB: 1960/11/26 DOA: 09/23/2023 PCP: [name], MD   Brief Narrative: This 62 y.o. female with medical history significant of hypertension, hyperlipidemia, CAD, heart failure with reduced EF, paroxysmal atrial fibrillation on Eliquis, carotid stenosis s/p left CEA, PAD s/p right TMA, COPD, diabetes mellitus type 2, CKD stage IIIb, gout, right foot ulcer, hypothyroidism, lower extremity ulcers, polysubstance abuse, and GERD who presents with complaints of chest pain.  Patient reported her symptoms started yesterday evening and located in the center of the chest while she was in the bed.  Pain radiating towards left arm. She also noted associated palpitations,  shortness of breath and progressively getting worse over the last week.  She has been taking furosemide 100 mg twice daily. She also follows up with wound care for worsening wounds in his lower extremities.  Patient was found to be in A-fib with RVR with heart rates in 170s.  Patient started on IV Cardizem.  Significant labs in the ED BNP 468, troponin 39> 82.  Chest x-ray shows mild cardiomegaly with left basilar atelectasis. Patient is admitted for further evaluation and started on Cardizem infusion, and IV lasix.  Cardiology is consulted.  Patient underwent successful DCCV to normal sinus rhythm.  Assessment & Plan:   Active Problems:   Atrial fibrillation with RVR ([name])   Elevated troponin   Chest pain   Leukocytosis   (HFpEF) heart failure with preserved ejection fraction ([name])   Uncontrolled type 2 diabetes mellitus with hyperglycemia, with long-term current use of insulin ([name])   Anemia of chronic disease   COPD (chronic obstructive pulmonary disease) ([name])   Chronic kidney disease, stage III (moderate) ([name])   PVD (peripheral vascular disease) ([name])   Hypothyroidism   Hyperlipidemia   Tobacco abuse   Obesity (BMI 30-39.9)  Atrial fibrillation with RVR: Patient presented  with chest pain, palpitations, shortness of breath,  found to have A-fib with RVR. Patient was placed on a Cardizem drip and given metoprolol 5 mg IV x 1 dose.. Increased metoprolol to 25 mg every 8 hours. Continued on  Cardizem gtt. Cardiology consulted recommended DCCV tomorrow if she continues to remain in A-fib. Patient underwent successful DCCV to NSR.  Chest pain: Elevated troponin likely demand ischemia: Patient reports having centralized chest pain.   High-sensitivity troponins 39-> 82.  Possibly secondary to demand. Recent echo 4/ 24 shows LVEF 50 to 55%, left ventricle has normal function no RWMA   Leukocytosis: WBC elevated at 11.9, but has been intermittently elevated over the last couple of months.   Could be due to infection in the wounds but not clear. Recheck CBC in the morning.   Heart failure with preserved ejection fraction: Acute on chronic. Patient  complains of increased swelling over the last week.   BNP elevated at 468.4 which is higher than priors. Last echocardiogram noted EF to be 50 to 55% with indeterminate diastolic parameters back in April.   Monitor Strict I&Os and daily weights Continue Lasix 80 mg IV twice daily.  Reassess diuresis in a.m. and adjust Lasix as needed Continue beta-blocker.   Type 2 diabetes with hyperglycemia: Last Hb A1c was noted to be elevated 11.1 on last checked on 07/27/2023.   Home regimen appears to include Lantus 37 units nightly and Humalog 10 units 3 times daily with meals. -Hypoglycemic protocols -Continue home regimen with pharmacy substitutions. -CBGs before every meal with additional sensitive sliding scale insulin -Adjust regimen as needed  Anemia of chronic disease: Hemoglobin 8.7 g/dL which appears around patient's baseline. Possibly secondary to patient being fluid overloaded. No reports of bleeding. Monitor H&H.   COPD, without acute exacerbation: Continue Symbicort. Albuterol nebs as needed for shortness  of breath/wheezing.   Chronic kidney disease stage IIIb: Serum creatinine at baseline. Continue to monitor kidney function with diuresis.   Leg ulcers: Patient has leg ulcers on the right and left leg that have been present for which she is followed in outpatient setting by wound care.   Mild erythema around the wounds noted, but no significant drainage appreciated. -Continue outpatient follow-up with wound care   Peripheral vascular disease: Status post right TMA: Continue Crestor.   Hypothyroidism: Continue levothyroxine.   Hyperlipidemia: Continue Crestor   Tobacco abuse: Patient still reports smoking half a pack cigarettes per day on average. Continue to counsel on need of cessation of tobacco.   GERD: Continue Protonix.   Obesity: BMI 38.62 kg/m Diet and exercise discussed in detail.  DVT prophylaxis: Eliquis Code Status: Full code Family Communication:No famiy at bed side Disposition Plan:   Status is: Inpatient Remains inpatient appropriate because:      Admitted for chest pain, A. Fib with RVR, Cardiology consulted, underwent DCCV to NSR. Anticipated discharge home 09/27/2023.  Consultants:  Cardiology  Procedures: Echocardiogram  Antimicrobials:  Anti-infectives (From admission, onward)    None      Subjective: Patient was seen and examined at bedside.  Overnight events noted.   Patient reports doing much better.  She underwent DCCV and now heart rate is controlled and in sinus rhythm.   Objective: Vitals:   09/26/23 1157 09/26/23 1200 09/26/23 1403 09/26/23 1436  BP: (!) 105/43 (!) 105/43 (!) 140/81   Pulse:      Resp:      Temp:    99.6 F (37.6 C)  TempSrc:    Oral  SpO2:      Weight:      Height:        Intake/Output Summary (Last 24 hours) at 09/26/2023 1509 Last data filed at 09/26/2023 1436 Gross per 24 hour  Intake 690.96 ml  Output 550 ml  Net 140.96 ml   Filed Weights   09/24/23 0357 09/25/23 0500 09/26/23 0417   Weight: 103.9 kg 105.1 kg 102 kg    Examination:  General exam: Appears calm and comfortable, not in any acute distress. Respiratory system: Clear to auscultation. Respiratory effort normal.  RR 14 Cardiovascular system: S1 & S2 heard, regular rhythm, no pedal edema. Gastrointestinal system: Abdomen is non distended, soft and non tender. Normal bowel sounds heard. Central nervous system: Alert and oriented x 3. No focal neurological deficits. Extremities: No edema , no cyanosis, no clubbing Skin: No rashes, lesions or ulcers Psychiatry: Judgement and insight appear normal. Mood & affect appropriate.     Data Reviewed: I have personally reviewed following labs and imaging studies  CBC: Recent Labs  Lab 09/23/23 0238 09/23/23 1051 09/24/23 0401 09/26/23 0552  WBC 11.9*  --  12.3* 13.3*  NEUTROABS  --   --  9.4*  --   HGB 8.7* 8.9* 8.4* 8.4*  HCT 28.9* 29.3* 27.1* 27.4*  MCV 92.6  --  90.6 91.0  PLT 242  --  239 261   Basic Metabolic Panel: Recent Labs  Lab 09/23/23 0238 09/24/23 0401 09/25/23 0614 09/26/23 0552  NA 136 132* 135 134*  K 3.6 4.5 4.2 4.0  CL 101 96* 102 98  CO2 23  24 25 24   GLUCOSE 378* 335* 212* 156*  BUN 59* 65* 60* 61*  CREATININE 1.96* 1.66* 1.68* 1.59*  CALCIUM 7.9* 8.4* 8.6* 8.5*  MG  --   --   --  2.1  PHOS  --   --   --  3.6   GFR: Estimated Creatinine Clearance: 43.7 mL/min (A) (by C-G formula based on SCr of 1.59 mg/dL (H)). Liver Function Tests: Recent Labs  Lab 09/23/23 0238  AST 23  ALT 22  ALKPHOS 150*  BILITOT <0.2  PROT 6.1*  ALBUMIN 2.5*   No results for input(s): "LIPASE", "AMYLASE" in the last 168 hours. No results for input(s): "AMMONIA" in the last 168 hours. Coagulation Profile: Recent Labs  Lab 09/23/23 0238  INR 1.1   Cardiac Enzymes: No results for input(s): "CKTOTAL", "CKMB", "CKMBINDEX", "TROPONINI" in the last 168 hours. BNP (last 3 results) No results for input(s): "PROBNP" in the last 8760  hours. HbA1C: Recent Labs    09/25/23 0614  HGBA1C 12.0*   CBG: Recent Labs  Lab 09/25/23 1551 09/25/23 2041 09/26/23 0745 09/26/23 1000 09/26/23 1234  GLUCAP 97 204* 173* 260* 360*   Lipid Profile: No results for input(s): "CHOL", "HDL", "LDLCALC", "TRIG", "CHOLHDL", "LDLDIRECT" in the last 72 hours. Thyroid Function Tests: No results for input(s): "TSH", "T4TOTAL", "FREET4", "T3FREE", "THYROIDAB" in the last 72 hours. Anemia Panel: No results for input(s): "VITAMINB12", "FOLATE", "FERRITIN", "TIBC", "IRON", "RETICCTPCT" in the last 72 hours. Sepsis Labs: No results for input(s): "PROCALCITON", "LATICACIDVEN" in the last 168 hours.  No results found for this or any previous visit (from the past 240 hour(s)).   Radiology Studies: No results found.  Scheduled Meds:  apixaban  5 mg Oral BID   buPROPion ER  100 mg Oral Daily   furosemide  80 mg Intravenous BID   insulin aspart  0-20 Units Subcutaneous TID WC   insulin aspart  10 Units Subcutaneous TID WC   insulin glargine-yfgn  45 Units Subcutaneous QHS   levothyroxine  50 mcg Oral Q0600   metoprolol succinate  25 mg Oral Daily   mometasone-formoterol  2 puff Inhalation BID   pantoprazole  40 mg Oral Daily   rosuvastatin  40 mg Oral QHS   sodium chloride flush  3 mL Intravenous Q12H   Continuous Infusions:     LOS: 2 days    Time spent: 35 mins    Willeen Niece, MD [facility]   If 7PM-7AM, please contact night-coverage

## 2023-09-26 NOTE — CV Procedure (Addendum)
Procedure: Electrical Cardioversion Indications:  Atrial Fibrillation  Procedure Details:  Consent: Risks of procedure as well as the alternatives and risks of each were explained to the (patient/caregiver).  Consent for procedure obtained.  Time Out: Verified patient identification, verified procedure, site/side was marked, verified correct patient position, special equipment/implants available, medications/allergies/relevent history reviewed, required imaging and test results available. PERFORMED.  Patient placed on cardiac monitor, pulse oximetry, supplemental oxygen as necessary.  Sedation given:  propofol Pacer pads placed anterior and posterior chest.  Cardioverted 1 time(s).  Cardioversion with synchronized biphasic 200J shock.  Evaluation: Findings: Post procedure EKG shows: ectopic rhythm, PVCs,  NSR Complications: None Patient did tolerate procedure well.  Time Spent Directly with the Patient:  15 minutes   Maisie Fus 09/26/2023, 8:03 AM

## 2023-09-26 NOTE — Interval H&P Note (Signed)
History and Physical Interval Note:  09/26/2023 7:47 AM  [name]  has presented today for surgery, with the diagnosis of afib.  The various methods of treatment have been discussed with the patient and family. After consideration of risks, benefits and other options for treatment, the patient has consented to  Procedure(s): CARDIOVERSION (CATH LAB) (N/A) as a surgical intervention.  The patient's history has been reviewed, patient examined, no change in status, stable for surgery.  I have reviewed the patient's chart and labs.  Questions were answered to the patient's satisfaction.     Maisie Fus

## 2023-09-26 NOTE — Consult Note (Addendum)
WOC Nurse Consult Note: Reason for Consult: Consult requested for bilat lower extremity wounds.  Pt is followed by the outpatient wound care center, most recently on 10/30, and has serial debridements performed during visits.  Left outer foot with dry brown callous, raised above skin level, conservative sharp wound debridement (CSWD performed at the bedside) using forceps and a scalpel. Tolerated without pain or bleeding. No open wound or drainage after the procedure; dry scabbed intact skin. Right plantar foot with full thickness wound, .8X.3X.2cm, red and dry Right anterior calf with full thickness wound, 90% red, 10% yellow, 1.5X1X.2cm Posterior leg with 3 areas of full thickness wounds, each approx .5X.5X.1cm, yellow and moist Dressing procedure/placement/frequency: Topical treatment orders provided for bedside nurses to perform as follows: 1. Left foot, debridment on callus, dry scabbed, intact skin below. Used moisture and applied foam dressing. 2. Right leg on the front, applied foam dressing. 3. Right leg on the back, applied foam dressing on the 3 areas of wound stage 2. 4. Change every 3 days. Pt should resume follow-up with the outpatient wound care center after discharge.  Please re-consult if further assistance is needed.  Thank-you,  Cammie Mcgee MSN, RN, CWOCN, [place], CNS [phone]

## 2023-09-27 ENCOUNTER — Other Ambulatory Visit (HOSPITAL_COMMUNITY): Payer: Self-pay

## 2023-09-27 ENCOUNTER — Encounter (HOSPITAL_COMMUNITY): Payer: Self-pay | Admitting: Internal Medicine

## 2023-09-27 ENCOUNTER — Ambulatory Visit (HOSPITAL_BASED_OUTPATIENT_CLINIC_OR_DEPARTMENT_OTHER): Payer: 59 | Admitting: Internal Medicine

## 2023-09-27 ENCOUNTER — Inpatient Hospital Stay (HOSPITAL_COMMUNITY): Payer: 59

## 2023-09-27 DIAGNOSIS — D638 Anemia in other chronic diseases classified elsewhere: Secondary | ICD-10-CM | POA: Diagnosis not present

## 2023-09-27 DIAGNOSIS — I5030 Unspecified diastolic (congestive) heart failure: Secondary | ICD-10-CM | POA: Diagnosis not present

## 2023-09-27 DIAGNOSIS — N179 Acute kidney failure, unspecified: Secondary | ICD-10-CM | POA: Diagnosis not present

## 2023-09-27 DIAGNOSIS — I4891 Unspecified atrial fibrillation: Secondary | ICD-10-CM | POA: Diagnosis not present

## 2023-09-27 DIAGNOSIS — I5033 Acute on chronic diastolic (congestive) heart failure: Secondary | ICD-10-CM | POA: Diagnosis not present

## 2023-09-27 DIAGNOSIS — R079 Chest pain, unspecified: Secondary | ICD-10-CM | POA: Diagnosis not present

## 2023-09-27 DIAGNOSIS — I4819 Other persistent atrial fibrillation: Secondary | ICD-10-CM | POA: Diagnosis not present

## 2023-09-27 LAB — ECHOCARDIOGRAM COMPLETE
AR max vel: 2.26 cm2
AV Area VTI: 2.04 cm2
AV Area mean vel: 2.05 cm2
AV Mean grad: 5 mm[Hg]
AV Peak grad: 8.1 mm[Hg]
Ao pk vel: 1.42 m/s
Area-P 1/2: 4.77 cm2
Height: 64 in
S' Lateral: 3.9 cm
Weight: 3676.8 [oz_av]

## 2023-09-27 LAB — GLUCOSE, CAPILLARY
Glucose-Capillary: 117 mg/dL — ABNORMAL HIGH (ref 70–99)
Glucose-Capillary: 213 mg/dL — ABNORMAL HIGH (ref 70–99)

## 2023-09-27 MED ORDER — TORSEMIDE 100 MG PO TABS
100.0000 mg | ORAL_TABLET | Freq: Every day | ORAL | 0 refills | Status: DC
  Filled 2023-09-27: qty 100, 100d supply, fill #0

## 2023-09-27 MED ORDER — TORSEMIDE 20 MG PO TABS
100.0000 mg | ORAL_TABLET | Freq: Every day | ORAL | Status: DC
Start: 2023-09-27 — End: 2023-09-27
  Administered 2023-09-27: 100 mg via ORAL
  Filled 2023-09-27: qty 5

## 2023-09-27 MED ORDER — PERFLUTREN LIPID MICROSPHERE
1.0000 mL | INTRAVENOUS | Status: AC | PRN
  Administered 2023-09-27: 2 mL via INTRAVENOUS

## 2023-09-27 MED ORDER — METOPROLOL SUCCINATE ER 25 MG PO TB24
25.0000 mg | ORAL_TABLET | Freq: Every day | ORAL | 0 refills | Status: DC
  Filled 2023-09-27: qty 30, 30d supply, fill #0

## 2023-09-27 NOTE — Care Management Important Message (Signed)
Important Message  Patient Details  Name: [name] MRN: [id number] Date of Birth: Aug 30, 1961   Important Message Given:  Yes - Medicare IM     Sherilyn Banker 09/27/2023, 2:12 PM

## 2023-09-27 NOTE — Care Management Important Message (Signed)
Important Message  Patient Details  Name: [name] MRN: [id number] Date of Birth: 1961-11-05   Important Message Given:  Yes - Medicare IM     Dorena Bodo 09/27/2023, 3:17 PM

## 2023-09-27 NOTE — Hospital Course (Signed)
62 y.o. female with medical history significant of hypertension, hyperlipidemia, CAD, heart failure with reduced EF, paroxysmal atrial fibrillation on Eliquis, carotid stenosis s/p left CEA, PAD s/p right TMA, COPD, diabetes mellitus type 2, CKD stage IIIb, gout, right foot ulcer, hypothyroidism, lower extremity ulcers, polysubstance abuse, and GERD who presents with complaints of chest pain.  Patient reported her symptoms started yesterday evening and located in the center of the chest while she was in the bed.  Pain radiating towards left arm. She also noted associated palpitations,  shortness of breath and progressively getting worse over the last week.  She has been taking furosemide 100 mg twice daily. She also follows up with wound care for worsening wounds in his lower extremities.  Patient was found to be in A-fib with RVR with heart rates in 170s.  Patient started on IV Cardizem.  Significant labs in the ED BNP 468, troponin 39> 82.  Chest x-ray shows mild cardiomegaly with left basilar atelectasis. Patient is admitted for further evaluation and started on Cardizem infusion, and IV lasix.  Cardiology is consulted.  Patient underwent successful DCCV to normal sinus rhythm.

## 2023-09-27 NOTE — TOC Transition Note (Signed)
Transition of Care [facility]) - CM/SW Discharge Note   Patient Details  Name: [name] MRN: [id number] Date of Birth: 01/24/1961  Transition of [facility] CM/SW Contact:  Tom-Johnson, Hershal Coria, RN Phone Number: 09/27/2023, 3:56 PM   Clinical Narrative:     Patient is scheduled for discharge today.  Readmission Risk Assessment done. Outpatient f/u, hospital f/u and discharge instructions on AVS. Prescriptions sent to [facility] pharmacy and meds will be delivered to patient at bedside prior discharge. No TOC needs or recommendations noted. Sister, Genevie Cheshire to transport at discharge.  No further TOC needs noted.                Final next level of care: Home/Self Care Barriers to Discharge: Barriers Resolved   Patient Goals and CMS Choice CMS Medicare.gov Compare Post Acute Care list provided to:: Patient Choice offered to / list presented to : NA  Discharge Placement                         Discharge Plan and Services Additional resources added to the After Visit Summary for                  DME Arranged: N/A DME Agency: NA       HH Arranged: NA HH Agency: NA        Social Determinants of Health (SDOH) Interventions SDOH Screenings   Food Insecurity: No Food Insecurity (09/23/2023)  Housing: Low Risk  (09/23/2023)  Transportation Needs: No Transportation Needs (09/23/2023)  Utilities: Not At Risk (09/23/2023)  Alcohol Screen: Low Risk  (01/28/2020)  Depression (PHQ2-9): Low Risk  (02/09/2023)  Financial Resource Strain: Low Risk  (02/09/2023)  Physical Activity: Inactive (02/09/2023)  Social Connections: Unknown (08/21/2023)   Received from [facility]  Stress: No Stress Concern Present (02/09/2023)  Tobacco Use: Medium Risk (09/23/2023)     Readmission Risk Interventions    09/27/2023    3:52 PM  Readmission Risk Prevention Plan  Transportation Screening Complete  Medication Review (RN Care Manager) Referral to Pharmacy  PCP or  Specialist appointment within 3-5 days of discharge Complete  HRI or Home Care Consult Complete  SW Recovery Care/Counseling Consult Complete  Palliative Care Screening Not Applicable  Skilled Nursing Facility Not Applicable

## 2023-09-27 NOTE — Discharge Summary (Addendum)
Physician Discharge Summary   Patient: [name] MRN: [id number] DOB: [birth year]  Admit date:     09/23/2023  Discharge date: 09/27/23  Discharge Physician: Rickey Barbara   PCP: [name], MD   Recommendations at discharge:    Follow up with PCP in 1-2 weeks Follow up with Cardiology as scheduled Recommend referral for outpatient sleep study to rule out OSA  Discharge Diagnoses: Active Problems:   Atrial fibrillation with RVR ([name])   Elevated troponin   Chest pain   Leukocytosis   (HFpEF) heart failure with preserved ejection fraction ([name])   Uncontrolled type 2 diabetes mellitus with hyperglycemia, with long-term current use of insulin ([name])   Anemia of chronic disease   COPD (chronic obstructive pulmonary disease) ([name])   Chronic kidney disease, stage III (moderate) ([name])   PVD (peripheral vascular disease) ([name])   Hypothyroidism   Hyperlipidemia   Tobacco abuse   Obesity (BMI 30-39.9)  Resolved Problems:   * No resolved hospital problems. *  Hospital Course: 62 y.o. female with medical history significant of hypertension, hyperlipidemia, CAD, heart failure with reduced EF, paroxysmal atrial fibrillation on Eliquis, carotid stenosis s/p left CEA, PAD s/p right TMA, COPD, diabetes mellitus type 2, CKD stage IIIb, gout, right foot ulcer, hypothyroidism, lower extremity ulcers, polysubstance abuse, and GERD who presents with complaints of chest pain.  Patient reported her symptoms started yesterday evening and located in the center of the chest while she was in the bed.  Pain radiating towards left arm. She also noted associated palpitations,  shortness of breath and progressively getting worse over the last week.  She has been taking furosemide 100 mg twice daily. She also follows up with wound care for worsening wounds in his lower extremities.  Patient was found to be in A-fib with RVR with heart rates in 170s.  Patient started on IV Cardizem.  Significant labs in the  ED BNP 468, troponin 39> 82.  Chest x-ray shows mild cardiomegaly with left basilar atelectasis. Patient is admitted for further evaluation and started on Cardizem infusion, and IV lasix.  Cardiology is consulted.  Patient underwent successful DCCV to normal sinus rhythm.   Assessment and Plan: Atrial fibrillation with RVR: Patient presented with chest pain, palpitations, shortness of breath,  found to have A-fib with RVR. Patient was placed on a Cardizem drip and given metoprolol 5 mg IV x 1 dose.. Later increased metoprolol to 25 mg every 8 hours with cardizem gtt Cardiology was consulted  Patient underwent successful DCCV to NSR. Per Cardiology, OK to d/c on metoprolol succinate 25mg  daily, torsemide 100mg  daily, eliquis bid   Chest pain: Elevated troponin likely demand ischemia: Patient reports having centralized chest pain.   High-sensitivity troponins 39-> 82.  Possibly secondary to demand. Repeat 2d echo reviewed with finding of EF 45-50%, note of regional wall motion in LV. I discussed with Cardiologist who recommends outpatient follow up and repeat 2d echo as outpatient. At present, pt remains chest pain free   Leukocytosis: WBC elevated at 11.9, but has been intermittently elevated over the last couple of months.     Heart failure with preserved ejection fraction: Acute on chronic. Patient  complains of increased swelling over the last week.   BNP elevated at 468.4 which is higher than priors. Last echocardiogram noted EF to be 50 to 55% with indeterminate diastolic parameters back in April.   Monitor Strict I&Os and daily weights Pt was given course of IV lasix this visit Continue beta-blocker.  Continue torsemide on d/c   Type 2 diabetes with hyperglycemia: Last Hb A1c was noted to be elevated 11.1 on last checked on 07/27/2023.   Home regimen appears to include Lantus 37 units nightly and Humalog 10 units 3 times daily with meals. -Hypoglycemic protocols -Continue home  regimen on d/c   Anemia of chronic disease: Hemoglobin 8.7 g/dL which appears around patient's baseline. Possibly secondary to patient being fluid overloaded. No reports of bleeding. Remained hemodynamically stable   COPD, without acute exacerbation: Continue Symbicort. Albuterol nebs as needed for shortness of breath/wheezing.   Chronic kidney disease stage IIIb: Serum creatinine at baseline. Continue to monitor kidney function with diuresis.   Leg ulcers: Patient has leg ulcers on the right and left leg that have been present for which she is followed in outpatient setting by wound care.   Mild erythema around the wounds noted, but no significant drainage appreciated. -Continue outpatient follow-up with wound care   Peripheral vascular disease: Status post right TMA: Continue Crestor.   Hypothyroidism: Continue levothyroxine.   Hyperlipidemia: Continue Crestor   Tobacco abuse: Patient still reports smoking half a pack cigarettes per day on average. Continue to counsel on need of cessation of tobacco.   GERD: Continue Protonix.   Obesity: BMI 38.62 kg/m Diet and exercise discussed in detail.    Consultants: Cardiology Procedures performed: DCCV  Disposition: Home Diet recommendation:  Cardiac and Carb modified diet DISCHARGE MEDICATION: Allergies as of 09/27/2023       Reactions   Gabapentin Nausea And Vomiting, Other (See Comments)   POSSIBLE SHAKING   Lyrica [pregabalin] Other (See Comments)   Shaking        Medication List     STOP taking these medications    losartan 25 MG tablet Commonly known as: COZAAR   metolazone 2.5 MG tablet Commonly known as: ZAROXOLYN   metoprolol tartrate 25 MG tablet Commonly known as: LOPRESSOR   Vascepa 1 g capsule Generic drug: icosapent Ethyl       TAKE these medications    Accu-Chek Guide test strip Generic drug: glucose blood USE TO CHECK BLOOD SUGAR FOUR TIMES DAILY   acetaminophen 500 MG  tablet Commonly known as: TYLENOL Take 1,000 mg by mouth every 6 (six) hours as needed for headache (pain).   albuterol 108 (90 Base) MCG/ACT inhaler Commonly known as: VENTOLIN HFA USE 2 PUFFS BY MOUTH EVERY FOUR HOURS, AS NEEDED, FOR COUGHING/WHEEZING   apixaban 5 MG Tabs tablet Commonly known as: Eliquis Take 1 tablet (5 mg total) by mouth 2 (two) times daily.   budesonide-formoterol 160-4.5 MCG/ACT inhaler Commonly known as: Symbicort Inhale 2 puffs into the lungs 2 (two) times daily.   buPROPion ER 100 MG 12 hr tablet Commonly known as: Wellbutrin SR Take 1 tablet (100 mg total) by mouth daily.   colchicine 0.6 MG tablet Take 1.2 mg by mouth See admin instructions. Take 1.2 mg for flair up and an hour later take 0.6 mg if needed   Easy Comfort Pen Needles 31G X 5 MM Misc Generic drug: Insulin Pen Needle USE FOUR TIMES A DAY FOR INSULIN ADMINISTRATION   insulin lispro 100 UNIT/ML KwikPen Commonly known as: HUMALOG Inject 10 Units into the skin 3 (three) times daily with meals.   Lantus SoloStar 100 UNIT/ML Solostar Pen Generic drug: insulin glargine Inject 37 Units into the skin daily. What changed: when to take this   levothyroxine 50 MCG tablet Commonly known as: SYNTHROID TAKE 1 TABLET (50 MCG  TOTAL) BY MOUTH DAILY BEFORE BREAKFAST (AM)   metoprolol succinate 25 MG 24 hr tablet Commonly known as: TOPROL-XL Take 1 tablet (25 mg total) by mouth daily. Start taking on: September 28, 2023   nystatin cream Commonly known as: MYCOSTATIN Apply topically 2 (two) times daily.   pantoprazole 40 MG tablet Commonly known as: PROTONIX TAKE 1 TABLET (40 MG TOTAL) BY MOUTH DAILY(AM)   polyethylene glycol 17 g packet Commonly known as: MIRALAX / GLYCOLAX Take 17 g by mouth daily as needed for moderate constipation.   rosuvastatin 40 MG tablet Commonly known as: CRESTOR TAKE 1 TABLET (40 MG TOTAL) BY MOUTH DAILY (BEDTIME)   torsemide 100 MG tablet Commonly known as:  DEMADEX Take 1 tablet (100 mg total) by mouth daily. Start taking on: September 28, 2023 What changed: See the new instructions.   triamcinolone ointment 0.1 % Commonly known as: KENALOG Apply topically 2 (two) times daily.   VITAMIN D3 PO Take 1 capsule by mouth every morning.   VITAMIN E PO Take 1 tablet by mouth daily.        Follow-up Information     Laurey Morale, MD Follow up.   Specialty: Cardiology Why: 10/03/2023 at 2:20 Contact information: [address] [place] [address] [name] [address] [phone]                Discharge Exam: Ceasar Mons Weights   09/26/23 0417 09/27/23 0500 09/27/23 0740  Weight: 102 kg 116 kg 104.2 kg   General exam: Awake, laying in bed, in nad Respiratory system: Normal respiratory effort, no wheezing Cardiovascular system: regular rate, s1, s2 Gastrointestinal system: Soft, nondistended, positive BS Central nervous system: CN2-12 grossly intact, strength intact Extremities: Perfused, no clubbing Skin: Normal skin turgor, no notable skin lesions seen Psychiatry: Mood normal // no visual hallucinations   Condition at discharge: fair  The results of significant diagnostics from this hospitalization (including imaging, microbiology, ancillary and laboratory) are listed below for reference.   Imaging Studies: ECHOCARDIOGRAM COMPLETE  Result Date: 09/27/2023    ECHOCARDIOGRAM REPORT   Patient Name:   ANTARA GHUMAN Date of Exam: 09/27/2023 Medical Rec #:  [id number]     Height:       64.0 in Accession #:    [id number]    Weight:       229.8 lb Date of Birth:  03-27-1961    BSA:          2.075 m Patient Age:    61 years      BP:           126/68 mmHg Patient Gender: F             HR:           76 bpm. Exam Location:  Inpatient Procedure: 2D Echo, Cardiac Doppler, Color Doppler and Intracardiac            Opacification Agent Indications:    Congestive Heart Failure I50.9  History:        Patient has prior history of  Echocardiogram examinations, most                 recent 10/01/2020. CHF, CAD, Arrythmias:Atrial Fibrillation;                 Risk Factors:Hypertension.  Sonographer:    Darlys Gales Referring Phys: [id number] SHENG L Kopke IMPRESSIONS  1. Left ventricular ejection fraction, by estimation, is 45 to 50%. The left ventricle has mildly  decreased function. The left ventricle demonstrates regional wall motion abnormalities (see scoring diagram/findings for description). There is mild concentric left ventricular hypertrophy. Left ventricular diastolic parameters are consistent with Grade II diastolic dysfunction (pseudonormalization).  2. Right ventricular systolic function is normal. The right ventricular size is normal. There is moderately elevated pulmonary artery systolic pressure. The estimated right ventricular systolic pressure is 50.0 mmHg.  3. Left atrial size was mildly dilated.  4. The mitral valve is degenerative. Mild mitral valve regurgitation. No evidence of mitral stenosis.  5. The aortic valve is grossly normal. Aortic valve regurgitation is not visualized. No aortic stenosis is present.  6. The inferior vena cava is dilated in size with >50% respiratory variability, suggesting right atrial pressure of 8 mmHg. Comparison(s): Changes from prior study are noted. EF largely unchanged. Anterolateral HK noted. FINDINGS  Left Ventricle: Left ventricular ejection fraction, by estimation, is 45 to 50%. The left ventricle has mildly decreased function. The left ventricle demonstrates regional wall motion abnormalities. The left ventricular internal cavity size was normal in size. There is mild concentric left ventricular hypertrophy. Left ventricular diastolic parameters are consistent with Grade II diastolic dysfunction (pseudonormalization).  LV Wall Scoring: The antero-lateral wall and apical lateral segment are hypokinetic. Right Ventricle: The right ventricular size is normal. No increase in right ventricular  wall thickness. Right ventricular systolic function is normal. There is moderately elevated pulmonary artery systolic pressure. The tricuspid regurgitant velocity is 3.24 m/s, and with an assumed right atrial pressure of 8 mmHg, the estimated right ventricular systolic pressure is 50.0 mmHg. Left Atrium: Left atrial size was mildly dilated. Right Atrium: Right atrial size was normal in size. Pericardium: There is no evidence of pericardial effusion. Mitral Valve: The mitral valve is degenerative in appearance. Mild to moderate mitral annular calcification. Mild mitral valve regurgitation. No evidence of mitral valve stenosis. Tricuspid Valve: The tricuspid valve is grossly normal. Tricuspid valve regurgitation is mild . No evidence of tricuspid stenosis. Aortic Valve: The aortic valve is grossly normal. Aortic valve regurgitation is not visualized. No aortic stenosis is present. Aortic valve mean gradient measures 5.0 mmHg. Aortic valve peak gradient measures 8.1 mmHg. Aortic valve area, by VTI measures 2.04 cm. Pulmonic Valve: The pulmonic valve was grossly normal. Pulmonic valve regurgitation is not visualized. No evidence of pulmonic stenosis. Aorta: The aortic root is normal in size and structure. Venous: The inferior vena cava is dilated in size with greater than 50% respiratory variability, suggesting right atrial pressure of 8 mmHg. IAS/Shunts: The atrial septum is grossly normal.  LEFT VENTRICLE PLAX 2D LVIDd:         5.30 cm   Diastology LVIDs:         3.90 cm   LV e' medial:    8.16 cm/s LV PW:         1.20 cm   LV E/e' medial:  13.4 LV IVS:        1.20 cm   LV e' lateral:   7.07 cm/s LVOT diam:     1.90 cm   LV E/e' lateral: 15.4 LV SV:         70 LV SV Index:   34 LVOT Area:     2.84 cm  RIGHT VENTRICLE RV S prime:     13.60 cm/s TAPSE (M-mode): 2.6 cm LEFT ATRIUM             Index        RIGHT ATRIUM  Index LA Vol (A2C):   84.6 ml 40.76 ml/m  RA Area:     21.60 cm LA Vol (A4C):   64.0 ml  30.84 ml/m  RA Volume:   64.80 ml  31.22 ml/m LA Biplane Vol: 77.6 ml 37.39 ml/m  AORTIC VALVE AV Area (Vmax):    2.26 cm AV Area (Vmean):   2.05 cm AV Area (VTI):     2.04 cm AV Vmax:           142.00 cm/s AV Vmean:          113.000 cm/s AV VTI:            0.343 m AV Peak Grad:      8.1 mmHg AV Mean Grad:      5.0 mmHg LVOT Vmax:         113.00 cm/s LVOT Vmean:        81.700 cm/s LVOT VTI:          0.247 m LVOT/AV VTI ratio: 0.72 MITRAL VALVE                TRICUSPID VALVE MV Area (PHT): 4.77 cm     TR Peak grad:   42.0 mmHg MV Decel Time: 159 msec     TR Vmax:        324.00 cm/s MV E velocity: 109.00 cm/s MV A velocity: 77.90 cm/s   SHUNTS MV E/A ratio:  1.40         Systemic VTI:  0.25 m                             Systemic Diam: 1.90 cm Lennie Odor MD Electronically signed by Lennie Odor MD Signature Date/Time: 09/27/2023/2:29:00 PM    Final    DG Chest Port 1 View  Result Date: 09/23/2023 CLINICAL DATA:  Atrial fibrillation and chest pain. EXAM: PORTABLE CHEST 1 VIEW COMPARISON:  July 27, 2023 FINDINGS: The cardiac silhouette is mildly enlarged and unchanged in size. Mild atelectasis is seen within the left lung base. No pleural effusion or pneumothorax is identified. The visualized skeletal structures are unremarkable. IMPRESSION: Mild cardiomegaly with mild left basilar atelectasis. Electronically Signed   By: Aram Candela M.D.   On: 09/23/2023 04:01   VAS [place] ABI WITH/WO TBI  Result Date: 09/12/2023  LOWER EXTREMITY DOPPLER STUDY Patient Name:  KANDANCE CHAVARIA  Date of Exam:   09/12/2023 Medical Rec #: [id number]      Accession #:    [id number] Date of Birth: 03/18/1961     Patient Gender: F Patient Age:   41 years Exam Location:  [facility] Procedure:      VAS [place] ABI WITH/WO TBI Referring Phys: Heath Lark --------------------------------------------------------------------------------  Indications: Peripheral artery disease. High Risk Factors: Diabetes,  current smoker.  Vascular Interventions: 08/11/23: Right femoropopliteal stent.                         06/07/21: PTA right SFA CTO, placement of spider distal                         protection device in right above-the-knee popliteal                         artery.  Hawk 1 directional atherectomy followed by drug-coated                         balloon angioplasty right SFA CTO, completion                         angiography. By Dr. Allyson Sabal                         06/28/19: Left CFA to ATA bypass graft with vein.                         01/15/15: Right SFA atherectomy and DCBA angioplasty by                         Dr. Allyson Sabal.                         09/10/13: Right SFA atherectomy by Dr. Allyson Sabal. Performing Technologist: Thereasa Parkin RVT  Examination Guidelines: A complete evaluation includes at minimum, Doppler waveform signals and systolic blood pressure reading at the level of bilateral brachial, anterior tibial, and posterior tibial arteries, when vessel segments are accessible. Bilateral testing is considered an integral part of a complete examination. Photoelectric Plethysmograph (PPG) waveforms and toe systolic pressure readings are included as required and additional duplex testing as needed. Limited examinations for reoccurring indications may be performed as noted.  ABI Findings: +--------+------------------+-----+----------+--------+ Right   Rt Pressure (mmHg)IndexWaveform  Comment  +--------+------------------+-----+----------+--------+ [id number]                                       +--------+------------------+-----+----------+--------+ ATA     87                0.60 monophasic         +--------+------------------+-----+----------+--------+ PTA     0                 0.00 absent             +--------+------------------+-----+----------+--------+ +---------+------------------+-----+----------+-------+ Left     Lt Pressure (mmHg)IndexWaveform  Comment  +---------+------------------+-----+----------+-------+ Brachial 144                                      +---------+------------------+-----+----------+-------+ ATA      149               1.03 monophasic        +---------+------------------+-----+----------+-------+ PTA      0                 0.00 absent            +---------+------------------+-----+----------+-------+ Great Toe103               0.72                   +---------+------------------+-----+----------+-------+ +-------+-----------+-----------+------------+------------+ ABI/TBIToday's ABIToday's TBIPrevious ABIPrevious TBI +-------+-----------+-----------+------------+------------+ Right  0.6        amp        0.57        amp          +-------+-----------+-----------+------------+------------+ Left   1.03  0.72       1.04        1            +-------+-----------+-----------+------------+------------+  Previous ABI on 01/11/23.  Summary: Right: Resting right ankle-brachial index indicates moderate right lower extremity arterial disease. Left: Resting left ankle-brachial index is within normal range. The left toe-brachial index is normal. *See table(s) above for measurements and observations.  Electronically signed by Heath Lark on 09/12/2023 at 4:34:30 PM.    Final    VAS [place] LOWER EXTREMITY ARTERIAL DUPLEX  Result Date: 09/12/2023 LOWER EXTREMITY ARTERIAL DUPLEX STUDY Patient Name:  AYLIAH FLICK  Date of Exam:   09/12/2023 Medical Rec #: [id number]      Accession #:    [id number] Date of Birth: [birth year]     Patient Gender: F Patient Age:   30 years Exam Location:  [facility] Procedure:      VAS [place] LOWER EXTREMITY ARTERIAL DUPLEX Referring Phys: Heath Lark --------------------------------------------------------------------------------   Vascular Interventions: 08/11/23: Right femoropopliteal stent.                         06/07/21: PTA right SFA CTO, placement of spider distal                          protection device in right above-the-knee popliteal                         artery.                         Hawk 1 directional atherectomy followed by drug-coated                         balloon angioplasty right SFA CTO, completion                         angiography. By Dr. Allyson Sabal                         06/28/19: Left CFA to ATA bypass graft with vein.                         01/15/15: Right SFA atherectomy and DCBA angioplasty by                         Dr. Allyson Sabal.                         09/10/13: Right SFA atherectomy by Dr. Allyson Sabal. Current ABI:            Right 0.6/amp Left 1.03/0.72 Performing Technologist: Thereasa Parkin RVT  Examination Guidelines: A complete evaluation includes B-mode imaging, spectral Doppler, color Doppler, and power Doppler as needed of all accessible portions of each vessel. Bilateral testing is considered an integral part of a complete examination. Limited examinations for reoccurring indications may be performed as noted.  +----------+--------+-----+---------------+----------+---------------+ RIGHT     PSV cm/sRatioStenosis       Waveform  Comments        +----------+--------+-----+---------------+----------+---------------+ CFA Distal102                         monophasic                +----------+--------+-----+---------------+----------+---------------+  DFA       179                         triphasic                 +----------+--------+-----+---------------+----------+---------------+ SFA Prox  287     2.87 50-74% stenosismonophasictamdem stenoses +----------+--------+-----+---------------+----------+---------------+ SFA Mid   129                         monophasicstent inflow    +----------+--------+-----+---------------+----------+---------------+ POP Prox  85                                                    +----------+--------+-----+---------------+----------+---------------+ POP Distal177     2.95 30-49%  stenosismonophasic50-74% by ratio +----------+--------+-----+---------------+----------+---------------+  Right Stent(s): +-------------------+--------+--------+----------+---------------------------+ prox-mid SFA to popPSV cm/sStenosisWaveform  Comments                    +-------------------+--------+--------+----------+---------------------------+ Prox to Stent      129             monophasic                            +-------------------+--------+--------+----------+---------------------------+ Proximal Stent     121             monophasic                            +-------------------+--------+--------+----------+---------------------------+ Mid Stent          93              monophasic                            +-------------------+--------+--------+----------+---------------------------+ Distal Stent       74              monophasic                            +-------------------+--------+--------+----------+---------------------------+ Distal to Stent    85              monophasicend of stent not visualized +-------------------+--------+--------+----------+---------------------------+  Summary: Right: 50-74% tandem stenoses in the proximal SFA. Patent stent with no stenosis. 30-49% distal popliteal artery stenosis by velcoty; 50-74% stenosis by ratio.  See table(s) above for measurements and observations. Electronically signed by Heath Lark on 09/12/2023 at 4:34:26 PM.    Final     Microbiology: Results for orders placed or performed during the hospital encounter of 07/27/23  Blood culture (routine x 2)     Status: None   Collection Time: 07/27/23 12:43 PM   Specimen: BLOOD  Result Value Ref Range Status   Specimen Description BLOOD SITE NOT SPECIFIED  Final   Special Requests   Final    BOTTLES DRAWN AEROBIC AND ANAEROBIC Blood Culture results may not be optimal due to an inadequate volume of blood received in culture bottles   Culture   Final    NO  GROWTH 5 DAYS Performed at [facility], [address], [place], [place] [address]    Report Status 08/01/2023 FINAL  Final  Blood culture (routine x 2)     Status: None   Collection Time: 07/27/23 12:48 PM   Specimen: BLOOD  Result Value Ref Range Status   Specimen Description BLOOD SITE NOT SPECIFIED  Final   Special Requests   Final    BOTTLES DRAWN AEROBIC AND ANAEROBIC Blood Culture results may not be optimal due to an inadequate volume of blood received in culture bottles   Culture   Final    NO GROWTH 5 DAYS Performed at [facility], [address], [place], [place] [address]    Report Status 08/01/2023 FINAL  Final  MRSA Next Gen by PCR, Nasal     Status: None   Collection Time: 07/27/23  6:05 PM   Specimen: Nasal Mucosa; Nasal Swab  Result Value Ref Range Status   MRSA by PCR Next Gen NOT DETECTED NOT DETECTED Final    Comment: (NOTE) The GeneXpert MRSA Assay (FDA approved for NASAL specimens only), is one component of a comprehensive MRSA colonization surveillance program. It is not intended to diagnose MRSA infection nor to guide or monitor treatment for MRSA infections. Test performance is not FDA approved in patients less than 48 years old. Performed at [facility], [address], [place], [place] [address]    *Note: Due to a large number of results and/or encounters for the requested time period, some results have not been displayed. A complete set of results can be found in Results Review.    Labs: CBC: Recent Labs  Lab 09/23/23 0238 09/23/23 1051 09/24/23 0401 09/26/23 0552  WBC 11.9*  --  12.3* 13.3*  NEUTROABS  --   --  9.4*  --   HGB 8.7* 8.9* 8.4* 8.4*  HCT 28.9* 29.3* 27.1* 27.4*  MCV 92.6  --  90.6 91.0  PLT 242  --  239 261   Basic Metabolic Panel: Recent Labs  Lab 09/23/23 0238 09/24/23 0401 09/25/23 0614 09/26/23 0552  NA 136 132* 135 134*  K 3.6 4.5 4.2 4.0  CL 101 96* 102 98  CO2 23 24 25 24   GLUCOSE 378*  335* 212* 156*  BUN 59* 65* 60* 61*  CREATININE 1.96* 1.66* 1.68* 1.59*  CALCIUM 7.9* 8.4* 8.6* 8.5*  MG  --   --   --  2.1  PHOS  --   --   --  3.6   Liver Function Tests: Recent Labs  Lab 09/23/23 0238  AST 23  ALT 22  ALKPHOS 150*  BILITOT <0.2  PROT 6.1*  ALBUMIN 2.5*   CBG: Recent Labs  Lab 09/26/23 1234 09/26/23 1631 09/26/23 2041 09/27/23 0757 09/27/23 1218  GLUCAP 360* 316* 224* 117* 213*    Discharge time spent: less than 30 minutes.  Signed: Rickey Barbara, MD [facility] 09/27/2023

## 2023-09-27 NOTE — Progress Notes (Signed)
Mobility Specialist Progress Note:   09/27/23 1200  Mobility  Activity Ambulated with assistance in hallway  Level of Assistance Contact guard assist, steadying assist  Assistive Device Front wheel walker  Distance Ambulated (ft) 60 ft  Activity Response Tolerated well  Mobility Referral Yes  $Mobility charge 1 Mobility  Mobility Specialist Start Time (ACUTE ONLY) 1217  Mobility Specialist Stop Time (ACUTE ONLY) 1228  Mobility Specialist Time Calculation (min) (ACUTE ONLY) 11 min    Pre Mobility: 76 HR,  99% SpO2 Post Mobility:  91 HR,  100% SpO2  Pt received in chair, agreeable to mobility. Asymptomatic throughout w/ VSS. C/o some bilat feet pain d/t existing nerve pain in feet. Returned to room w/o fault. Pt left in chair with call bell and all needs met.  D'Vante Earlene Plater Mobility Specialist Please contact via Special educational needs teacher or Rehab office at [phone]

## 2023-09-27 NOTE — Progress Notes (Addendum)
Patient Name: [name] Date of Encounter: 09/27/2023 [facility] Cardiologist: Nanetta Batty, MD   Interval Summary  .    No complaints today maintaining normal sinus rhythm.  She feels like she is back at baseline as far swelling.  No shortness of breath or chest pain.  Vital Signs .    Vitals:   09/26/23 2046 09/27/23 0107 09/27/23 0500 09/27/23 0740  BP:  (!) 127/57 126/68   Pulse:  73    Resp: 20 16    Temp: 98.1 F (36.7 C) 97.7 F (36.5 C) 98 F (36.7 C)   TempSrc: Oral Oral Oral   SpO2:  97%    Weight:   116 kg 104.2 kg  Height:        Intake/Output Summary (Last 24 hours) at 09/27/2023 0746 Last data filed at 09/27/2023 0739 Gross per 24 hour  Intake 300 ml  Output 1150 ml  Net -850 ml      09/27/2023    7:40 AM 09/27/2023    5:00 AM 09/26/2023    4:17 AM  Last 3 Weights  Weight (lbs) 229 lb 12.8 oz 255 lb 11.2 oz 224 lb 14.4 oz  Weight (kg) 104.237 kg 115.985 kg 102.014 kg      Telemetry/ECG    Normal sinus rhythm heart rates in the 80s- Personally Reviewed  CV Studies    Echocardiogram 03/02/2023 1. Left ventricular ejection fraction, by estimation, is 50 to 55%. The  left ventricle has low normal function. The left ventricle has no regional  wall motion abnormalities. Left ventricular diastolic parameters are  indeterminate.   2. Right ventricular systolic function is normal. The right ventricular  size is normal. There is normal pulmonary artery systolic pressure.   3. The mitral valve is normal in structure. Trivial mitral valve  regurgitation. No evidence of mitral stenosis.   4. The aortic valve is normal in structure. Aortic valve regurgitation is  not visualized. No aortic stenosis is present.   5. The inferior vena cava is normal in size with greater than 50%  respiratory variability, suggesting right atrial pressure of 3 mmHg.   Physical Exam .   GEN: No acute distress.   Neck: Difficult to assess JVD Cardiac:  RRR, no murmurs, rubs, or gallops.  Respiratory: Clear to auscultation bilaterally. GI: Soft, nontender, non-distended  MS: Tight, no pitting edema.  Improving  Patient Profile    [name] is a 62 y.o. 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     Paroxysmal atrial fibrillation Initially presented with uncontrolled rates between 100-140.  Started on IV diltiazem max infusion with no significant improvement.  She was successfully cardioverted 09/26/2023. Feels significantly better. Continue Eliquis 5 mg twice daily.  Continue Toprol-XL 25 mg daily.  Normal TSH 2 months ago  Heart failure with recovered EF Previously depressed likely in the setting of substance abuse but since about 2021 this has normalized.  Chronically has issues with peripheral edema that has worsened but no shortness of breath/orthopnea.  Reports her torsemide dosing has decreased efficacy.    She reports her  peripheral edema is back at baseline despite increase in weight from yesterday.  She also had reported decrease output with her Lasix possibly suggesting euvolemia.  May be able to discharge, was on a very high diuretic regimen.  Pending on labs may be able to initiate SGLT2 inhibitor for additional diuretic/diabetes control.  Could consider spiro however seems to have had some hyperkalemia on this before, may consider low-dose.  Will discontinue IV Lasix 80 mg twice daily.  Will discuss with MD diuretic regiment Volume status is difficult to assess.  PTA was on torsemide 100 mg twice daily and metolazone.  Reports baseline weight  being around 216-225?[name]  Today 230. Will repeat echocardiogram now back in sinus, pending    CKD Creatinine actually appears to be improved from previous admission.  Creatinine on admission 1.96.  Currently 1.68>1.59   Type 2 diabetes A1c 11.1%.  Needs better control.     PAD status post right TMA Has had multiple interventions in the past.  Also recently seen in September with femoral-popliteal angioplasty and stenting followed by Dr. Allyson Sabal.  Continue rosuvastatin 40 mg, LDL 46.   Bilateral leg ulcers Followed by wound care.    Nicotine dependence 1 pack/day.  Continue to encourage cessation.  Reports that she cannot afford nicotine patches.   COPD Per primary team.   Prior substance abuse Prior alcohol abuse/cirrhosis Sober for several years now.  No lab work for today.  I placed orders.  She will be seen by advanced heart failure on 10/03/2023.  Discussed with MD, we will discharge on torsemide 100 mg daily.   For questions or updates, please contact [facility] Please consult [email or web address] for contact info under        Signed, Abagail Kitchens, PA-C    I have seen and examined the patient along with Abagail Kitchens, PA-C.  I have reviewed the chart, notes and new data.  I agree with PA/NP's note.  Key new complaints: feels well, back to her baseline Key examination changes: RRR.  Her weight has been quite variable and was measured twice today.  Suspect the latest weight of 230 pounds is reasonably accurate estimate. Key new findings / data: renal parameters continue improving trend   PLAN:  She remains mildly hypervolemic, probably roughly 10 pounds above her "dry weight".  Weight was roughly 100 kg (220 pounds) at heart failure clinic visits in March/May/August when she was felt to be close to euvolemia.  Does not have any respiratory difficulty.  Hopefully, now that she is back in normal rhythm she will have some degree of improved cardiac output and improve  diuresis with gradual return to baseline.  She has gradually improving renal function.  Scheduled to be seen back in heart failure clinic in less than a week.  Despite intravenous diuretic she has not required any potassium supplementation and looking back over her recent labs she seems to have a tendency to have high potassium levels.  Not on spironolactone.  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  [facility] will sign off.   Medication Recommendations:   Eliquis 5 mg twice daily Metoprolol succinate 25 mg daily Torsemide 100 mg daily Other recommendations (labs, testing, etc): Sodium restricted diet.  Continue daily weight monitoring.  Call us if she gains 3 pounds in 24 hours if she ever gains more than 5 pounds over current weight.  Recheck basic metabolic panel at follow-up visit Follow up as an outpatient: Has an  appointment with Dr. Shirlee Latch 10/03/2023.

## 2023-09-28 ENCOUNTER — Other Ambulatory Visit (HOSPITAL_COMMUNITY): Payer: Self-pay

## 2023-09-28 ENCOUNTER — Telehealth: Payer: Self-pay

## 2023-09-28 NOTE — Progress Notes (Signed)
Paramedicine Encounter    Patient ID: [name], female    DOB: 18-Nov-1960, 62 y.o.   MRN: [id number]  Pt just got home from hosp last night. Here today for med rec-there was several med changes.  She was taken off losartan and vascepa and started on metoprolol succinate and decreased to 100mg  daily.    She reports she feels better but feels bloated. She has had 3 BM this morning.  No bleeding issues.   Pt unable to weigh. She does have her cardiomems set up so she will continue to use that daily.   Lungs have very slight wheeze to lower lobes.  Meds verified and pill box refilled.   She has clinic appoint next week. Will meet her there.    BP 124/60   Pulse 72   Resp 18   LMP  (LMP Unknown)   SpO2 97%  Weight yesterday-? Last visit weight-?  Patient Care Team: [name], MD as PCP - General (Family Medicine) Runell Gess, MD as PCP - Cardiology (Cardiology) Lanier Prude, MD as PCP - Electrophysiology (Cardiology) Runell Gess, MD as Consulting Physician (Cardiology) Nyoka Cowden, MD as Consulting Physician (Pulmonary Disease) System, Provider Not In  Patient Active Problem List   Diagnosis Date Noted   Atrial fibrillation with RVR ([name]) 09/23/2023   Chest pain 09/23/2023   Uncontrolled type 2 diabetes mellitus with hyperglycemia, with long-term current use of insulin ([name]) 09/23/2023   Chronic kidney disease, stage III (moderate) ([name]) 09/23/2023   Obesity (BMI 30-39.9) 09/23/2023   Severe sepsis with acute organ dysfunction ([name]) 07/27/2023   Urinary tract infection without hematuria 07/27/2023   Encephalopathy acute 07/27/2023   Acute renal failure superimposed on stage 3 chronic kidney disease ([name]) 07/27/2023   Severe sepsis ([name]) 07/27/2023   Pre-ulcerative calluses 04/25/2022   Viral gastroenteritis 04/18/2022   Hyperlipidemia associated with type 2 diabetes mellitus ([name]) 07/29/2021   (HFpEF) heart failure with preserved ejection  fraction ([name]) 07/29/2021   Uncontrolled type 2 diabetes mellitus with hyperglycemia ([name]) 06/13/2021   Acute kidney injury superimposed on CKD ([name]) 06/13/2021   Critical ischemia of foot ([name]) 06/07/2021   Fall 05/05/2021   PAF (paroxysmal atrial fibrillation) ([name]) 05/05/2021   COPD (chronic obstructive pulmonary disease) ([name]) 05/05/2021   DM (diabetes mellitus), type 2 with complications ([name]) 05/05/2021   PAD (peripheral artery disease) ([name]) 05/05/2021   Cellulitis 05/05/2021   Acute on chronic combined systolic and diastolic CHF (congestive heart failure) ([name]) 05/05/2021   OSA (obstructive sleep apnea) 05/05/2021   Stage 3b chronic kidney disease ([name]) 05/05/2021   AKI (acute kidney injury) ([name]) 05/04/2021   Pressure injury of skin 05/04/2021   Ulcer of foot, unspecified laterality, limited to breakdown of skin ([name]) 02/17/2021   AF (paroxysmal atrial fibrillation) ([name]) 01/02/2021   CAD (coronary artery disease) 01/02/2021   PVD (peripheral vascular disease) ([name]) 01/02/2021   Diabetes mellitus type 2, uncontrolled, with complications 01/02/2021   Diabetic nephropathy ([name]) 01/02/2021   Low back pain 06/01/2020   Hyperlipidemia 04/03/2020   Muscle weakness 03/20/2020   Pain in elbow 03/12/2020   PAD (peripheral artery disease) ([name]) 12/26/2019   Acute renal failure ([name])    Acute respiratory failure with hypoxia ([name]) 12/02/2019   Cellulitis in diabetic foot ([name]) 07/08/2019   Osteomyelitis ([name]) 06/21/2019   NICM (nonischemic cardiomyopathy) ([name]) 06/20/2019   Non-healing ulcer ([name]) 06/20/2019   CHF (congestive heart failure) ([name]) 11/26/2018   Coagulation disorder ([name]) 08/09/2017  Depression 07/21/2017   At risk for adverse drug reaction 06/20/2017   Peripheral neuropathy 06/20/2017   S/P transmetatarsal amputation of foot, right ([name]) 06/05/2017   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) 05/19/2017    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[name])    SIRS (systemic inflammatory response syndrome) ([name]) 04/06/2017   Suspected sleep apnea 11/24/2016   Ulcer of toe of right foot, with necrosis of bone ([name]) 10/27/2016   Ulcer of left lower leg ([name]) 05/19/2016   Severe obesity (BMI >= 40) ([name]) 02/24/2016   Morbid obesity ([name]) 02/24/2016   Encounter for therapeutic drug monitoring 02/10/2016   Symptomatic bradycardia 01/12/2016   Hypertension associated with diabetes ([name]) 12/22/2015   Elevated troponin    Chronic combined systolic and diastolic CHF (congestive heart failure) ([name])    Leukocytosis 11/08/2015   Anemia of chronic disease 11/08/2015   Hypokalemia 11/08/2015   Tobacco abuse 10/23/2015   Coronary artery disease    DOE (dyspnea on exertion) 04/29/2015   Diabetes mellitus type 2, uncontrolled 02/08/2015   Influenza A 02/07/2015   Wrist fracture, left, closed, initial encounter 01/29/2015   PAF (paroxysmal atrial fibrillation) ([name]) 01/16/2015   Carotid arterial disease ([name]) 01/16/2015   Claudication ([name]) 01/15/2015   Demand ischemia ([name]) 10/29/2014   Insomnia 02/03/2014   S/P peripheral artery angioplasty - TurboHawk atherectomy; R SFA 09/11/2013    Class: Acute   Leg pain, bilateral 08/19/2013   Hypothyroidism 07/31/2013   Cellulitis 06/13/2013   History of cocaine abuse ([name]) 06/13/2013   Long term current use of anticoagulant therapy 05/20/2013   Alcohol abuse    Narcotic abuse ([name])    Marijuana abuse    Alcoholic cirrhosis ([name])    DM (diabetes mellitus), type 2 with peripheral vascular complications ([name])     Current Outpatient Medications:    acetaminophen (TYLENOL) 500 MG tablet, Take 1,000 mg by mouth every 6 (six) hours as needed for headache (pain)., Disp: , Rfl:    albuterol (VENTOLIN HFA) 108 (90 Base) MCG/ACT inhaler, USE 2 PUFFS BY MOUTH EVERY FOUR HOURS, AS NEEDED, FOR COUGHING/WHEEZING, Disp: 8.5 g, Rfl: 0   apixaban (ELIQUIS) 5 MG TABS tablet, Take 1  tablet (5 mg total) by mouth 2 (two) times daily., Disp: 60 tablet, Rfl: 6   budesonide-formoterol (SYMBICORT) 160-4.5 MCG/ACT inhaler, Inhale 2 puffs into the lungs 2 (two) times daily., Disp: 1 each, Rfl: 2   buPROPion ER (WELLBUTRIN SR) 100 MG 12 hr tablet, Take 1 tablet (100 mg total) by mouth daily., Disp: 30 tablet, Rfl: 6   Cholecalciferol (VITAMIN D3 PO), Take 1 capsule by mouth every morning., Disp: , Rfl:    insulin lispro (HUMALOG) 100 UNIT/ML KwikPen, Inject 10 Units into the skin 3 (three) times daily with meals., Disp: 15 mL, Rfl: 0   LANTUS SOLOSTAR 100 UNIT/ML Solostar Pen, Inject 37 Units into the skin daily. (Patient taking differently: Inject 37 Units into the skin at bedtime.), Disp: 15 mL, Rfl: 0   levothyroxine (SYNTHROID) 50 MCG tablet, TAKE 1 TABLET (50 MCG TOTAL) BY MOUTH DAILY BEFORE BREAKFAST (AM), Disp: 30 tablet, Rfl: 0   metoprolol succinate (TOPROL-XL) 25 MG 24 hr tablet, Take 1 tablet (25 mg total) by mouth daily., Disp: 30 tablet, Rfl: 0   pantoprazole (PROTONIX) 40 MG tablet, TAKE 1 TABLET (40 MG TOTAL) BY MOUTH DAILY(AM), Disp: 30 tablet, Rfl: 0   rosuvastatin (CRESTOR) 40 MG tablet, TAKE 1 TABLET (40 MG TOTAL) BY MOUTH  DAILY (BEDTIME), Disp: 90 tablet, Rfl: 0   torsemide (DEMADEX) 100 MG tablet, Take 1 tablet (100 mg total) by mouth daily., Disp: 150 tablet, Rfl: 0   VITAMIN E PO, Take 1 tablet by mouth daily., Disp: , Rfl:    ACCU-CHEK GUIDE test strip, USE TO CHECK BLOOD SUGAR FOUR TIMES DAILY, Disp: , Rfl:    colchicine 0.6 MG tablet, Take 1.2 mg by mouth See admin instructions. Take 1.2 mg for flair up and an hour later take 0.6 mg if needed, Disp: , Rfl:    EASY COMFORT PEN NEEDLES 31G X 5 MM MISC, USE FOUR TIMES A DAY FOR INSULIN ADMINISTRATION, Disp: 100 each, Rfl: 1   nystatin cream (MYCOSTATIN), Apply topically 2 (two) times daily., Disp: 30 g, Rfl: 0   polyethylene glycol (MIRALAX / GLYCOLAX) 17 g packet, Take 17 g by mouth daily as needed for moderate  constipation. (Patient not taking: Reported on 09/23/2023), Disp: 14 each, Rfl: 0   triamcinolone ointment (KENALOG) 0.1 %, Apply topically 2 (two) times daily., Disp: 454 g, Rfl: 0 Allergies  Allergen Reactions   Gabapentin Nausea And Vomiting and Other (See Comments)    POSSIBLE SHAKING   Lyrica [Pregabalin] Other (See Comments)    Shaking       Social History   Socioeconomic History   Marital status: Single    Spouse name: Not on file   Number of children: 1   Years of education: 12   Highest education level: 12th grade  Occupational History   Occupation: disabled  Tobacco Use   Smoking status: Former    Current packs/day: 1.00    Average packs/day: 1 pack/day for 44.0 years (44.0 ttl pk-yrs)    Types: E-cigarettes, Cigarettes   Smokeless tobacco: Former    Types: Snuff  Vaping Use   Vaping status: Former   Devices: 11/26/2018 "stopped months ago"  Substance and Sexual Activity   Alcohol use: Not Currently    Comment: occ   Drug use: Not Currently    Types: "Crack" cocaine, Marijuana, Oxycodone   Sexual activity: Not Currently  Other Topics Concern   Not on file  Social History Narrative   ** Merged History Encounter **       Lives in [place], in motel with sister.  They are looking to move but don't have a place to go yet.     Social Determinants of Health   Financial Resource Strain: Low Risk  (02/09/2023)   Overall Financial Resource Strain (CARDIA)    Difficulty of Paying Living Expenses: Not hard at all  Food Insecurity: No Food Insecurity (09/23/2023)   Hunger Vital Sign    Worried About Running Out of Food in the Last Year: Never true    Ran Out of Food in the Last Year: Never true  Transportation Needs: No Transportation Needs (09/23/2023)   PRAPARE - Administrator, Civil Service (Medical): No    Lack of Transportation (Non-Medical): No  Physical Activity: Inactive (02/09/2023)   Exercise Vital Sign    Days of Exercise per Week: 0 days     Minutes of Exercise per Session: 0 min  Stress: No Stress Concern Present (02/09/2023)   [employer] of Occupational Health - Occupational Stress Questionnaire    Feeling of Stress : Not at all  Social Connections: Unknown (08/21/2023)   [facility]   Social Network    Social Network: Not on file  Intimate Partner Violence: Not At Risk (  09/23/2023)   Humiliation, Afraid, Rape, and Kick questionnaire    Fear of Current or Ex-Partner: No    Emotionally Abused: No    Physically Abused: No    Sexually Abused: No    Physical Exam      Future Appointments  Date Time Provider Department Center  10/03/2023  2:20 PM Laurey Morale, MD [facility] None  10/04/2023  2:00 PM Allen Derry [place] III, PA-C [facility]  10/10/2023 11:00 AM Leonie Douglas, MD [facility]  10/11/2023  1:30 PM Allen Derry [place] III, PA-C [facility]  02/12/2024  4:00 PM [facility] VISIT [facility] None       Kerry Hough, Paramedic [phone] [facility] Paramedic  09/28/23

## 2023-09-28 NOTE — Transitions of Care (Post Inpatient/ED Visit) (Signed)
   09/28/2023  Name: RAYLA PIECHOTA MRN: [id number] DOB: 24-Jan-1961  Today's TOC FU Call Status: Today's TOC FU Call Status:: Unsuccessful Call (1st Attempt) Unsuccessful Call (1st Attempt) Date: 09/28/23  Attempted to reach the patient regarding the most recent Inpatient/ED visit.  Follow Up Plan: Additional outreach attempts will be made to reach the patient to complete the Transitions of Care (Post Inpatient/ED visit) call.   Alyse Low, RN, BA, [facility], CRRN [facility] Coordinator, Transition of Care Ph # [phone]

## 2023-09-29 ENCOUNTER — Telehealth: Payer: Self-pay

## 2023-09-29 NOTE — Transitions of Care (Post Inpatient/ED Visit) (Signed)
   09/29/2023  Name: [name] MRN: [id number] DOB: [birth year]  Today's TOC FU Call Status: Today's TOC FU Call Status:: Unsuccessful Call (2nd Attempt) Unsuccessful Call (2nd Attempt) Date: 09/29/23  Attempted to reach the patient regarding the most recent Inpatient/ED visit.  Follow Up Plan: Additional outreach attempts will be made to reach the patient to complete the Transitions of Care (Post Inpatient/ED visit) call.   Alyse Low, RN, BA, [facility], CRRN [facility] Coordinator, Transition of Care Ph # [phone]

## 2023-10-02 ENCOUNTER — Telehealth: Payer: Self-pay

## 2023-10-02 NOTE — Transitions of Care (Post Inpatient/ED Visit) (Signed)
   10/02/2023  Name: [name] MRN: [id number] DOB: July 26, 1961  Today's TOC FU Call Status: Unsuccessful Call (3rd Attempt) Date: 10/02/23 (actually 4th attempt as could not leave message on 3rd attempt due to VM full)  Attempted to reach the patient regarding the most recent Inpatient/ED visit.  Follow Up Plan: No further outreach attempts will be made at this time. We have been unable to contact the patient.  Alyse Low, RN, BA, [facility], CRRN [facility] Coordinator, Transition of Care Ph # [phone]

## 2023-10-02 NOTE — Transitions of Care (Post Inpatient/ED Visit) (Signed)
   10/02/2023  Name: [name] AGE MRN: [id number] DOB: 02/03/1961  Today's TOC FU Call Status: Today's TOC FU Call Status:: Unsuccessful Call (3rd Attempt) Unsuccessful Call (3rd Attempt) Date: 10/02/23  Attempted to reach the patient regarding the most recent Inpatient/ED visit.  Follow Up Plan: No further outreach attempts will be made at this time. We have been unable to contact the patient.  Alyse Low, RN, BA, [name], CRRN [facility] Coordinator, Transition of Care Ph # [phone]

## 2023-10-03 ENCOUNTER — Encounter (HOSPITAL_COMMUNITY): Payer: Self-pay | Admitting: Cardiology

## 2023-10-03 ENCOUNTER — Ambulatory Visit (HOSPITAL_COMMUNITY)
Admission: RE | Admit: 2023-10-03 | Discharge: 2023-10-03 | Disposition: A | Discharge status: Home / Self Care | Payer: 59 | Source: Ambulatory Visit | Attending: Cardiology | Admitting: Cardiology

## 2023-10-03 ENCOUNTER — Other Ambulatory Visit (HOSPITAL_COMMUNITY): Payer: Self-pay

## 2023-10-03 VITALS — BP 120/78 | HR 75 | Wt 238.0 lb

## 2023-10-03 DIAGNOSIS — I480 Paroxysmal atrial fibrillation: Secondary | ICD-10-CM

## 2023-10-03 DIAGNOSIS — Z794 Long term (current) use of insulin: Secondary | ICD-10-CM | POA: Insufficient documentation

## 2023-10-03 DIAGNOSIS — I130 Hypertensive heart and chronic kidney disease with heart failure and stage 1 through stage 4 chronic kidney disease, or unspecified chronic kidney disease: Secondary | ICD-10-CM | POA: Insufficient documentation

## 2023-10-03 DIAGNOSIS — F1011 Alcohol abuse, in remission: Secondary | ICD-10-CM | POA: Insufficient documentation

## 2023-10-03 DIAGNOSIS — I2510 Atherosclerotic heart disease of native coronary artery without angina pectoris: Secondary | ICD-10-CM | POA: Diagnosis not present

## 2023-10-03 DIAGNOSIS — N183 Chronic kidney disease, stage 3 unspecified: Secondary | ICD-10-CM | POA: Diagnosis not present

## 2023-10-03 DIAGNOSIS — I739 Peripheral vascular disease, unspecified: Secondary | ICD-10-CM | POA: Diagnosis not present

## 2023-10-03 DIAGNOSIS — Z79899 Other long term (current) drug therapy: Secondary | ICD-10-CM | POA: Diagnosis not present

## 2023-10-03 DIAGNOSIS — J449 Chronic obstructive pulmonary disease, unspecified: Secondary | ICD-10-CM | POA: Diagnosis not present

## 2023-10-03 DIAGNOSIS — E785 Hyperlipidemia, unspecified: Secondary | ICD-10-CM | POA: Diagnosis not present

## 2023-10-03 DIAGNOSIS — F17210 Nicotine dependence, cigarettes, uncomplicated: Secondary | ICD-10-CM | POA: Diagnosis not present

## 2023-10-03 DIAGNOSIS — E1151 Type 2 diabetes mellitus with diabetic peripheral angiopathy without gangrene: Secondary | ICD-10-CM | POA: Insufficient documentation

## 2023-10-03 DIAGNOSIS — I5032 Chronic diastolic (congestive) heart failure: Secondary | ICD-10-CM

## 2023-10-03 DIAGNOSIS — Z7901 Long term (current) use of anticoagulants: Secondary | ICD-10-CM | POA: Insufficient documentation

## 2023-10-03 DIAGNOSIS — I5042 Chronic combined systolic (congestive) and diastolic (congestive) heart failure: Secondary | ICD-10-CM

## 2023-10-03 DIAGNOSIS — E1122 Type 2 diabetes mellitus with diabetic chronic kidney disease: Secondary | ICD-10-CM | POA: Diagnosis not present

## 2023-10-03 DIAGNOSIS — G4733 Obstructive sleep apnea (adult) (pediatric): Secondary | ICD-10-CM | POA: Insufficient documentation

## 2023-10-03 DIAGNOSIS — I5022 Chronic systolic (congestive) heart failure: Secondary | ICD-10-CM | POA: Diagnosis present

## 2023-10-03 DIAGNOSIS — I6521 Occlusion and stenosis of right carotid artery: Secondary | ICD-10-CM | POA: Insufficient documentation

## 2023-10-03 LAB — BASIC METABOLIC PANEL
Anion gap: 12 (ref 5–15)
BUN: 53 mg/dL — ABNORMAL HIGH (ref 6–20)
CO2: 21 mmol/L — ABNORMAL LOW (ref 22–32)
Calcium: 9.1 mg/dL (ref 8.9–10.3)
Chloride: 103 mmol/L (ref 98–111)
Creatinine, Ser: 1.43 mg/dL — ABNORMAL HIGH (ref 0.44–1.00)
GFR, Estimated: 42 mL/min — ABNORMAL LOW (ref >60)
Glucose, Bld: 399 mg/dL — ABNORMAL HIGH (ref 70–99)
Potassium: 4.3 mmol/L (ref 3.5–5.1)
Sodium: 136 mmol/L (ref 135–145)

## 2023-10-03 LAB — BRAIN NATRIURETIC PEPTIDE: B Natriuretic Peptide: 1657 pg/mL — ABNORMAL HIGH (ref 0.0–100.0)

## 2023-10-03 MED ORDER — METOLAZONE 2.5 MG PO TABS
2.5000 mg | ORAL_TABLET | ORAL | 3 refills | Status: DC
Start: 2023-10-03 — End: 2023-10-13

## 2023-10-03 MED ORDER — POTASSIUM CHLORIDE CRYS ER 20 MEQ PO TBCR: 40.0000 meq | EXTENDED_RELEASE_TABLET | Freq: Every day | ORAL | 3 refills | Status: DC

## 2023-10-03 MED ORDER — TORSEMIDE 100 MG PO TABS: 100.0000 mg | ORAL_TABLET | Freq: Two times a day (BID) | ORAL | 3 refills | Status: DC

## 2023-10-03 NOTE — Patient Instructions (Addendum)
START Potassium 40 mEq ( 2 tabs) daily.  START Metolazone 2.5 mg every Wednesday.  DO NOT START TILL NEXT WEDNESDAY 10/11/23.  RESTART Torsemide 100 mg bid on 10/07/23  Your provider has order Furoscix for you. This is an on-body infuser that gives you a dose of Furosemide.   It will be shipped to your home   [employer] will call you to discuss before shipping so, PLEASE answer unknown calls  For questions regarding the device call [employer] at [phone]  Ensure you write down the time you start your infusion so that if there is a problem you will know how long the infusion lasted  Use Furoscix only AS DIRECTED by our office  Dosing Directions:   Day 1= USE 1 KIT ON 10/04/23  Day 2= USE 1 KIT ON 10/05/23  Day 3= USE 1 KIT ON 10/06/23  DO NOT TAKE ANY TORSEMIDE WHILE TAKING THE FUROSCIX.[name]  Your physician recommends that you schedule a follow-up appointment in: as scheduled.  If you have any questions or concerns before your next appointment please send [place] a message through [place] or call our office at [phone].    TO LEAVE A MESSAGE FOR THE NURSE SELECT OPTION 2, PLEASE LEAVE A MESSAGE INCLUDING: YOUR NAME DATE OF BIRTH CALL BACK NUMBER REASON FOR CALL**this is important as we prioritize the call backs  YOU WILL RECEIVE A CALL BACK THE SAME DAY AS LONG AS YOU CALL BEFORE 4:00 PM  At the [facility], you and your health needs are our priority. As part of our continuing mission to provide you with exceptional heart care, we have created designated Provider Care Teams. These Care Teams include your primary Cardiologist (physician) and Advanced Practice Providers (APPs- Physician Assistants and Nurse Practitioners) who all work together to provide you with the care you need, when you need it.   You may see any of the following providers on your designated Care Team at your next follow up: Dr Arvilla Meres Dr Marca Ancona Dr. Dorthula Nettles Dr. Clearnce Hasten [name] Filbert Schilder, NP Robbie Lis, [place] [facility] [place], [place] Brynda Peon, NP [place] Lee, NP Karle Plumber, PharmD   Please be sure to bring in all your medications bottles to every appointment.    Thank you for choosing [facility]

## 2023-10-03 NOTE — Progress Notes (Unsigned)
Paramedicine Encounter   Patient ID: [name] , female,   DOB: 03-14-1961,61 y.o.,  MRN: [id number]   Met patient in clinic today with provider.  Weight @ clinic-238  B/P-120/78 P-75 [id number]  Med changes- Restart daily of potassium Metolazone on Wednesdays starting next week.   Reports breathing at night is difficult.  Her weight is up 23lbs from last time here.  No c/p.  Uses wheelchair at home.  Needs electric wheelchair- she will f/u with PCP   Come back in 1-2 wk to see APP.  Labs today.   Drinks a lot of fluids during the day.  Meds verified and went ahead to fill pill box with changes noted above.   Given furoscix today dose today, tomor and Thursday.  No torsemide during these 3 days.  She just looks awful today. Wheezing present also. Advised her and her sister to call me if they have any questions.   Kerry Hough, EMT-Paramedic [phone] 10/04/2023

## 2023-10-03 NOTE — Progress Notes (Signed)
Medication Samples have been provided to the patient.  Drug name: Furoscix       Strength: 80mg  /91ml        Qty: 2  LOT: [id number]  Exp.Date: 03/13/25  Dosing instructions: take as directed by the hear failure clinic  The patient has been instructed regarding the correct time, dose, and frequency of taking this medication, including desired effects and most common side effects.   [name] M [name] 3:13 PM 10/03/2023

## 2023-10-03 NOTE — Progress Notes (Signed)
Patient ID: [name], female   DOB: [birth year], 62 y.o.   MRN: [id number]    [facility] Note   PCP: [facility] PV: Dr. Allyson Sabal HF Cardiology: Dr. Shirlee Latch  [name] is a 62 y.o. with history of PAD, carotid stenosis s/p left CEA, relatively mild CAD, chronic systolic CHF, paroxysmal atrial fibrillation and prior substance abuse presents for CHF clinic followup.  She was admitted in 1/17 with acute hypoxemic respiratory failure in the setting of atrial fibrillation/RVR and volume overload.  She was initially intubated.  She converted back to NSR with amiodarone gtt.  She was treated with IV Lasix, steroids, bronchodilators.  She developed AKI and losartan was stopped.    She was admitted in 3/17 with symptomatic bradycardia.  She was supposed to stop diltiazem after this admission but continued to take it.  She presented later in 3/17, again with symptomatic bradycardia (junctional bradycardia), hypotension, and AKI.  Diltiazem and bisoprolol were stopped.  HR recovered and she has had no problems since.  ACEI was stopped with AKI.   In 5/18, she had peripheral angiography with atherectomy of right SFA.  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 7/18 ABIs showed 0.65 on right, 0.31 on left.  In 8/20, she had peripheral angiography showing totally occluded left SFA.  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 Arbie Cookey.  ABIs in 2/21 were 0.44 on right, 0.99 on left.   Echo in 1/21 showed EF 50%, mild LVH, normal RV, PASP 60 mmHg.   Echo 03/08/21 EF 55-60% with basal to mid inferolateral hypokinesis.   Admitted 6/22 for mechanical fall, found to have AKI on CKD. She was given IV lasix and her losartan was held.  Underwent PV angiogram with Dr. Allyson Sabal 7/22. She had drug-coated balloon angioplasty of the right SFA after being admitted for critical ischemia of the right foot.  Repeat peripheral  arterial dopplers in 8/22 still showed severe right SFA disease.   Admitted 8/22 for bradycardia and AKI, bisoprolol subsequently stopped; amiodarone was continued.   Echo 4/24 EF 50-55%, RV normal.  Admitted 9/24 with UTI/sepsis. Required ICU care, BiPap and abx. Underwent RLE angiogram with femoropopliteal angioplasty and stenting.  She was admitted in 11/24 with atrial fibrillation/RVR.  Echo in 11/24 showed EF 45-50%, normal RV, mild MR, anterolateral akinesis. She was cardioverted.  She was taken off losartan and spironolactone. Prior to admission, she was on torsemide 100 mg bid and metolazone 2.5 once a week.  She was sent home only on torsemide 100 mg daily and no metolazone.   Today she returns for HF follow up with [name] from paramedicine. Living with her sister. Not using Cardiomems regularly. Weight is up 23 lbs since last appointment.  She has noted worsening swelling since discharge from hospital on significantly lower diuretic regimen than she went to the hospital with. She is in a wheelchair, has bilateral foot wounds and wound care team wants her bearing weight minimally.  She has 3 pillow orthopnea.  No chest pain.  She is short of breath with transfers, dressing, and bathing.   ECG (personally reviewed): NSR, normal  Labs (5/19): K 5, creatinine 1.75 Labs (2/21): K 4.1, creatinine 1.58 Labs (4/22): K 3.7, creatinine 1.55 Labs (5/22): K 3.8, creatinine 1.54 Labs (8/22): K 4.0, creatinine 2.07 Labs (11/22): TSH normal, LDL 59 Labs (12/22): K 3.6, creatinine 1.28, BNP 256, AST/ALT  normal Labs (3/23): K 4.3, creatinine 1.82 => 1.7, hgb 11 Labs (5/22) K 3.6, creatinine 2.2, TG 405  Labs (6/22) K 3.6, creatinine 1.34  Labs (8/23): K 4.7, creatinine 2.11, LDL 93, TGs 315 Labs (10/23): K 3.4, creatinine 1.35 Labs (1/24): K 4.0, creatinine 3.58 Labs (9/24): K 3.8, creatinine 1.44 Labs (11/24): K 4, creatinine 1.59  PMH: 1. Carotid stenosis: Known occluded right carotid.  Left  CEA in 4/16.  - Carotid dopplers (7/21): CTO RICA, mild disease LICA.  - Carotid dopplers (7/22): CTO RICA, 1-39% LICA.  - Carotid dopplers (3/23): Totally occluded RICA, 1-39% LICA.  2. CAD: LHC in 12/15 with 80% stenosis in small OM1, nonobstructive disease in other territories.  3. Chronic systolic CHF: Nonischemic cardiomyopathy (?due to ETOH or prior drug abuse).  She has a Cardiomems.  - Echo (1/17) with EF 45%, mild LV hypertrophy, moderate diastolic dysfunction, inferolateral severe hypokinesis, mildly decreased RV systolic function.  - Echo (1/18): EF 40-45%, mild LVH, normal RV size and systolic function.  - Echo (1/21): EF 50%, mild LVH, normal RV, PASP 60 mmHg.  - Echo (4/22): EF 55-60%, basal-mid inferolateral hypokinesis with grade II diastolic dysfunction, RV normal, PASP 42 - Echo (4/24): EF 50-55%.  - Echo (11/24): EF 45-50%, normal RV, mild MR, anterolateral akinesis. 4. Atrial fibrillation: Paroxysmal.   - DCCV 11/24 5. Type II diabetes 6. CKD: Stage III. 7. COPD: Smokes 1/4-1/2 ppd.  8. 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  No longer drinks.  9. Hypothyroidism 10. PAD: Atherectomy SFA in 2014 (Dr Allyson Sabal).  Peripheral arterial dopplers (2/17) with focal 75-99% proximal right SFA stenosis, occluded mid-distal right SFA, chronic occlusion of all runoff arteries on right. - In 5/18, she had peripheral angiography with atherectomy of right SFA.   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  - 7/18 ABIs showed 0.65 on right, 0.31 on left.   - Peripheral angiography 8/20: Totally occluded left SFA.  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 Arbie Cookey.   - ABIs (2/21): 0.44 R, 0.99 L.  - Balloon angioplasty right SFA 7/22.  - ABIs (8/22): severe right SFA disease.  - peripheral arterial dopplers (2/23): 50-74% right SFA and right popliteal. Stable.  - s/p RLE angiogram with femoropopliteal angioplasty and  stenting 9/24 - ABIs (11/24): 0.6 on right, 1.03 on left.  11. Anemia 12. Prior cocaine abuse.  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  14. OSA: Has not been able to tolerate CPAP.  15. Diabetic neuropathy.   SH: Lives with sister.  Prior cocaine abuse.  Prior ETOH abuse.  Quit smoking in 1/17, restarted, and quit again in 4/19, restarted again.  FH: CAD  Review of systems complete and found to be negative unless listed in HPI.    Current Outpatient Medications  Medication Sig Dispense Refill   ACCU-CHEK GUIDE test strip USE TO CHECK BLOOD SUGAR FOUR TIMES DAILY     acetaminophen (TYLENOL) 500 MG tablet Take 1,000 mg by mouth every 6 (six) hours as needed for headache (pain).     albuterol (VENTOLIN HFA) 108 (90 Base) MCG/ACT inhaler USE 2 PUFFS BY MOUTH EVERY FOUR HOURS, AS NEEDED, FOR COUGHING/WHEEZING 8.5 g 0   apixaban (ELIQUIS) 5 MG TABS tablet Take 1 tablet (5 mg total) by mouth 2 (two) times daily. 60 tablet 6   budesonide-formoterol (SYMBICORT) 160-4.5 MCG/ACT inhaler Inhale 2 puffs  into the lungs 2 (two) times daily. 1 each 2   buPROPion ER (WELLBUTRIN SR) 100 MG 12 hr tablet Take 1 tablet (100 mg total) by mouth daily. 30 tablet 6   Cholecalciferol (VITAMIN D3 PO) Take 1 capsule by mouth every morning.     EASY COMFORT PEN NEEDLES 31G X 5 MM MISC USE FOUR TIMES A DAY FOR INSULIN ADMINISTRATION 100 each 1   fluticasone (FLONASE) 50 MCG/ACT nasal spray Place 1 spray into both nostrils as needed for allergies or rhinitis.     insulin lispro (HUMALOG) 100 UNIT/ML KwikPen Inject 10 Units into the skin 3 (three) times daily with meals. 15 mL 0   LANTUS SOLOSTAR 100 UNIT/ML Solostar Pen Inject 37 Units into the skin daily. 15 mL 0   levothyroxine (SYNTHROID) 50 MCG tablet TAKE 1 TABLET (50 MCG TOTAL) BY MOUTH DAILY BEFORE BREAKFAST (AM) 30 tablet 0   metolazone (ZAROXOLYN) 2.5 MG tablet Take 1 tablet (2.5 mg total) by mouth once a week. Every Wednesday  90 tablet 3   metoprolol succinate (TOPROL-XL) 25 MG 24 hr tablet Take 1 tablet (25 mg total) by mouth daily. 30 tablet 0   nystatin cream (MYCOSTATIN) Apply topically 2 (two) times daily. 30 g 0   pantoprazole (PROTONIX) 40 MG tablet TAKE 1 TABLET (40 MG TOTAL) BY MOUTH DAILY(AM) 30 tablet 0   polyethylene glycol (MIRALAX / GLYCOLAX) 17 g packet Take 17 g by mouth daily as needed for moderate constipation. 14 each 0   potassium chloride SA (KLOR-CON M20) 20 MEQ tablet Take 2 tablets (40 mEq total) by mouth daily. 180 tablet 3   rosuvastatin (CRESTOR) 40 MG tablet TAKE 1 TABLET (40 MG TOTAL) BY MOUTH DAILY (BEDTIME) 90 tablet 0   triamcinolone ointment (KENALOG) 0.1 % Apply topically 2 (two) times daily. 454 g 0   VITAMIN E PO Take 1 tablet by mouth daily.     colchicine 0.6 MG tablet Take 1.2 mg by mouth See admin instructions. Take 1.2 mg for flair up and an hour later take 0.6 mg if needed (Patient not taking: Reported on 10/03/2023)     torsemide (DEMADEX) 100 MG tablet Take 1 tablet (100 mg total) by mouth 2 (two) times daily. 180 tablet 3   No current facility-administered medications for this encounter.   BP 120/78   Pulse 75   Wt 108 kg (238 lb)   LMP  (LMP Unknown)   SpO2 97%   BMI 40.85 kg/m   Wt Readings from Last 3 Encounters:  10/03/23 108 kg (238 lb)  09/27/23 104.2 kg (229 lb 12.8 oz)  09/12/23 101.2 kg (223 lb)   General: NAD, in wheelchair.  Neck: JVP 14-16, no thyromegaly or thyroid nodule.  Lungs: Distant BS CV: Nondisplaced PMI.  Heart regular S1/S2, no S3/S4, no murmur.  1+ edema to thighs.  No carotid bruit.  Unable to palpate pedal pulses.  Abdomen: Soft, nontender, no hepatosplenomegaly, no distention.  Skin: Intact without lesions or rashes.  Neurologic: Alert and oriented x 3.  Psych: Normal affect. Extremities: No clubbing or cyanosis.  HEENT: Normal.   Assessment/Plan: 1. Chronic HF with mid range EF: LHC in 12/15 showing only 80% stenosis in small  OM1. EF as low as 40-45% in the past. Echo 4/22 showed EF 55-60% with grade 2 diastolic dysfunction.  Echo (4/24) EF 50-55%.  Echo in 11/24 with EF 45-50%, normal RV, mild MR, anterolateral akinesis.  NYHA class III, volume overloaded on exam after  diuretics significantly cut back after last admission.  - I will give her Furoscix 80 mg Beatrice daily x 3 days.  - After Furoscix completed, she will start back on her former torsemide dose, 100 mg bid, and will also restart metolazone 2.5 mg once weekly on Wednesdays.  Start KCl 40 daily.  BMET/BNP today and again in 1 week. - Losartan and spironolactone were stopped at last admission.  - She is off Jardiance due to severe UTIs.  - Continue Toprol XL 25 mg daily.  - She needs to use her Cardiomems daily.  2. Atrial fibrillation: Paroxysmal. NSR today.  - Continue Eliquis 5 mg bid.  - I would like her to eventually be seen by EP for AF ablation, but need to control her volume first.  3. PAD: Claudication bilaterally.  Not candidate for cilostazol with CHF.  She has had a left fem-pop bypass in 8/20.  ABIs in 2/21 were very abnormal on right. She underwent PV angiogram with balloon angioplasty of the right SFA 7/22.  8/22 dopplers still showed severe right SFA disease. Dopplers 2/23 showed patent right SFA.  Peripheral arterial dopplers in 2/23 showed 50-74% stenosis right SFA and popliteal.  S/p RLE femoropopliteal angioplasty and stenting in 9/24. She still has bilateral foot ulcers.  - [facility] - Continue rosuvastatin.  - She is on apixaban so no ASA.  4. COPD:  Has cut back from 1/2 ppd to 3-4 cigs/day. Continue bupropion.  - Discussed smoking cessation.    5. H/o cirrhosis: Likely from ETOH, she has quit.  6. OSA: Not able to tolerate CPAP.   7. CKD stage III:  Baseline creatinine around 1.5 - BMET today. 8. HTN: BP controlled. 9.Type 2 diabetes: Per PCP.  She is on insulin. 10. CAD: LHC in 12/15 with 80% stenosis in small OM1,  nonobstructive disease in other territories. No chest pain. - Continue rosuvastatin.  - Not on ASA with need for Eliquis. 11. Carotid stenosis: Stable 4/24 dopplers. 12. Hyperlipidemia: She is on Crestor and Vascepa.   Followup in 1 week with APP to reassess volume.   [name] 10/03/2023   Karla Ancona, MD 10/03/2023

## 2023-10-04 ENCOUNTER — Other Ambulatory Visit (HOSPITAL_COMMUNITY): Payer: Self-pay

## 2023-10-04 ENCOUNTER — Encounter (HOSPITAL_BASED_OUTPATIENT_CLINIC_OR_DEPARTMENT_OTHER): Payer: 59 | Attending: Physician Assistant | Admitting: Internal Medicine

## 2023-10-04 DIAGNOSIS — I130 Hypertensive heart and chronic kidney disease with heart failure and stage 1 through stage 4 chronic kidney disease, or unspecified chronic kidney disease: Secondary | ICD-10-CM | POA: Diagnosis not present

## 2023-10-04 DIAGNOSIS — I87331 Chronic venous hypertension (idiopathic) with ulcer and inflammation of right lower extremity: Secondary | ICD-10-CM | POA: Insufficient documentation

## 2023-10-04 DIAGNOSIS — E1151 Type 2 diabetes mellitus with diabetic peripheral angiopathy without gangrene: Secondary | ICD-10-CM | POA: Insufficient documentation

## 2023-10-04 DIAGNOSIS — L97522 Non-pressure chronic ulcer of other part of left foot with fat layer exposed: Secondary | ICD-10-CM | POA: Diagnosis not present

## 2023-10-04 DIAGNOSIS — N1832 Chronic kidney disease, stage 3b: Secondary | ICD-10-CM | POA: Insufficient documentation

## 2023-10-04 DIAGNOSIS — E1122 Type 2 diabetes mellitus with diabetic chronic kidney disease: Secondary | ICD-10-CM | POA: Insufficient documentation

## 2023-10-04 DIAGNOSIS — E11622 Type 2 diabetes mellitus with other skin ulcer: Secondary | ICD-10-CM | POA: Diagnosis not present

## 2023-10-04 DIAGNOSIS — L97828 Non-pressure chronic ulcer of other part of left lower leg with other specified severity: Secondary | ICD-10-CM | POA: Insufficient documentation

## 2023-10-04 DIAGNOSIS — L97812 Non-pressure chronic ulcer of other part of right lower leg with fat layer exposed: Secondary | ICD-10-CM | POA: Insufficient documentation

## 2023-10-04 DIAGNOSIS — L97512 Non-pressure chronic ulcer of other part of right foot with fat layer exposed: Secondary | ICD-10-CM | POA: Insufficient documentation

## 2023-10-04 DIAGNOSIS — E11621 Type 2 diabetes mellitus with foot ulcer: Secondary | ICD-10-CM | POA: Insufficient documentation

## 2023-10-04 NOTE — Progress Notes (Signed)
Trystan's sister reached out to me this morning reporting she can't get the furoscix to work and asked if I could come out and see what is going on.  I went out there and she had removed the whole sticky from the back and accidentally pushed the blue start button so the needle had already engaged.  I just placed the other one that she had there with her for today, she should be getting shipment by tomor I believe of the others.  We reviewed the placement and she said she understood now and knows what to do next.  I advised her I would be here thurs to assist if needed but not Friday, but she is sure she can place it next time.  We wrote down that it was started at 1130.  Kerry Hough, EMT-Paramedic  [phone] 10/04/2023

## 2023-10-05 ENCOUNTER — Telehealth (HOSPITAL_COMMUNITY): Payer: Self-pay | Admitting: Cardiology

## 2023-10-05 NOTE — Telephone Encounter (Signed)
Pts sister arrived for sample of furoscix'reports they were unable to use  one of the kits given on 11/19 and furoscix direct had not mailed kit   Sample provided and phone number for furoscix direct to follow up with shipment,   Medication Samples have been provided to the patient.  Drug name: furoscix       Strength: 80mg         Qty: 1  LOT: [id number]  Exp.Date: 03/13/2025  Dosing instructions: as directed   The patient has been instructed regarding the correct time, dose, and frequency of taking this medication, including desired effects and most common side effects.   Theresia Bough 11:19 AM 10/05/2023

## 2023-10-05 NOTE — Progress Notes (Signed)
[name], [name] ([id number]) [id number] Page 1 of 15 Visit Report for 10/04/2023 Arrival Information Details Patient Name: Date of Service: [name] 10/04/2023 2:00 PM Medical Record Number: [id number] Patient Account Number: [id number] Date of Birth/Sex: Treating RN: Apr 27, 1961 (62 y.o. [name] Primary Care [name]: [name] Other Clinician: Referring [name]: Treating [name]/Extender: [name] in Treatment: 24 Visit Information History Since Last Visit Added or deleted any medications: Yes Patient Arrived: Wheel Chair Any new allergies or adverse reactions: No Arrival Time: 14:18 Had a fall or experienced change in No Accompanied By: self activities of daily living that may affect Transfer Assistance: None risk of falls: Patient Identification Verified: Yes Signs or symptoms of abuse/neglect since last visito No Secondary Verification Process Completed: Yes Hospitalized since last visit: Yes Patient Requires Transmission-Based Precautions: No Implantable device outside of the clinic excluding No Patient Has Alerts: Yes cellular tissue based products placed in the center Patient Alerts: ABI R 0.57 (01/11/23) since last visit: ABI L 1.04 (01/11/23) Has Dressing in Place as Prescribed: No Price Present Now: No Electronic Signature(Price) Signed: 10/04/2023 5:07:51 PM By: Redmond Pulling RN, BSN Entered By: Redmond Pulling on 10/04/2023 11:20:55 -------------------------------------------------------------------------------- Encounter Discharge Information Details Patient Name: Date of Service: Karla Bigness, [name] 10/04/2023 2:00 PM Medical Record Number: [id number] Patient Account Number: [id number] Date of Birth/Sex: Treating RN: [birth year] (61 y.o. [name] Primary Care Kipp Shank: [name] Other Clinician: Referring [name]: Treating [name]/Extender: [name] in Treatment: 24 Encounter Discharge Information Items Discharge Condition: Stable Ambulatory Status: Wheelchair Discharge Destination: Home Transportation: Private Auto Accompanied By: self Schedule Follow-up Appointment: Yes Clinical Summary of Care: Patient Declined Electronic Signature(Price) Signed: 10/04/2023 5:07:51 PM By: Redmond Pulling RN, BSN Entered By: Redmond Pulling on 10/04/2023 12:45:41 [name] ([id number]) [id number] Page 2 of 15 -------------------------------------------------------------------------------- Lower Extremity Assessment Details Patient Name: Date of Service: [name]. 10/04/2023 2:00 PM Medical Record Number: [id number] Patient Account Number: [id number] Date of Birth/Sex: Treating RN: January 07, 1961 (62 y.o. [name] Primary Care [name]: [name] Other Clinician: Referring [name]: Treating [name]/Extender: [name] Weeks in Treatment: 24 Edema Assessment Assessed: [Left: No] [Right: No] Edema: [Left: Yes] [Right: Yes] Calf Left: Right: Point of Measurement: 30 cm From Medial Instep 45 cm 46 cm Ankle Left: Right: Point of Measurement: 9 cm From Medial Instep 24.5 cm 23.3 cm Vascular Assessment Extremity colors, hair growth, and conditions: Extremity Color: [Left:Hyperpigmented] [Right:Hyperpigmented] Hair Growth on Extremity: [Left:No] [Right:No] Temperature of Extremity: [Left:Warm] [Right:Warm] Capillary Refill: [Left:< 3 seconds] [Right:< 3 seconds] Dependent Rubor: [Left:No Yes] [Right:No Yes] Electronic Signature(Price) Signed: 10/04/2023 5:07:51 PM By: Redmond Pulling RN, BSN Entered By: Redmond Pulling on 10/04/2023 11:26:45 -------------------------------------------------------------------------------- Multi Wound Chart Details Patient Name: Date of Service: Karla Bigness, [name] 10/04/2023 2:00 PM Medical Record Number: [id number] Patient Account  Number: [id number] Date of Birth/Sex: Treating RN: 1961/01/23 (61 y.o. F) Primary Care [name]: [name] Other Clinician: Referring [name]: Treating [name]/Extender: [name] Weeks in Treatment: 24 Vital Signs Height(in): 64 Pulse(bpm): 65 Weight(lbs): 225 Blood Pressure(mmHg): 138/80 Body Mass Index(BMI): 38.6 Temperature(F): 98.6 Respiratory Rate(breaths/min): 18 [23:Photos:] [id number] Page 3 of 15] Right Metatarsal head first Right, Anterior Lower Leg Right, Distal, Posterior Lower Leg Wound Location: Gradually Appeared Bump Gradually Appeared Wounding Event: Diabetic Wound/Ulcer of the Lower Trauma, Other Venous Leg Ulcer Primary Etiology: Extremity Chronic Obstructive Pulmonary Chronic Obstructive Pulmonary Chronic Obstructive Pulmonary Comorbid History:  Disease (COPD), Sleep Apnea, Disease (COPD), Sleep Apnea, Disease (COPD), Sleep Apnea, Arrhythmia, Congestive Heart Failure,Arrhythmia, Congestive Heart Failure, Arrhythmia, Congestive Heart Failure, Coronary Artery Disease, Coronary Artery Disease, Coronary Artery Disease, Hypertension, Peripheral Arterial Hypertension, Peripheral Arterial Hypertension, Peripheral Arterial Disease, Cirrhosis , Type II Diabetes, Disease, Cirrhosis , Type II Diabetes, Disease, Cirrhosis , Type II Diabetes, Osteoarthritis, Neuropathy Osteoarthritis, Neuropathy Osteoarthritis, Neuropathy 04/18/2021 08/20/2023 09/06/2023 Date Acquired: 24 6 4  Weeks of Treatment: Open Open Open Wound Status: No No No Wound Recurrence: No No No Clustered Wound: 1.6x1.4x0.2 1.6x1.6x0.1 0.5x0.5x0.1 Measurements L x W x D (cm) 1.759 2.011 0.196 A (cm) : rea 0.352 0.201 0.02 Volume (cm) : -86.70% -70.70% -66.10% % Reduction in Area: 25.30% -70.30% -66.70% % Reduction in Volume: Grade 2 Full Thickness Without Exposed Full Thickness Without Exposed Classification: Support Structures  Support Structures Medium Medium Medium Exudate Amount: Serosanguineous Serosanguineous Serosanguineous Exudate Type: red, brown red, brown red, brown Exudate Color: Distinct, outline attached Distinct, outline attached Distinct, outline attached Wound Margin: Large (67-100%) Medium (34-66%) Small (1-33%) Granulation Amount: Pink Red Red, Pink Granulation Quality: Small (1-33%) Medium (34-66%) Large (67-100%) Necrotic Amount: Fat Layer (Subcutaneous Tissue): Yes Fat Layer (Subcutaneous Tissue): Yes Fat Layer (Subcutaneous Tissue): Yes Exposed Structures: Fascia: No Fascia: No Tendon: No Tendon: No Muscle: No Muscle: No Joint: No Joint: No Bone: No Bone: No None None None Epithelialization: Callus: Yes Excoriation: No Excoriation: No Periwound Skin Texture: Induration: No Induration: No Callus: No Callus: No Crepitus: No Crepitus: No Rash: No Rash: No Scarring: No Scarring: No Maceration: Yes Maceration: No Maceration: No Periwound Skin Moisture: Dry/Scaly: No Dry/Scaly: No Dry/Scaly: No No Abnormalities Noted Hemosiderin Staining: Yes Hemosiderin Staining: Yes Periwound Skin Color: Atrophie Blanche: No Atrophie Blanche: No Cyanosis: No Cyanosis: No Ecchymosis: No Ecchymosis: No Erythema: No Erythema: No Mottled: No Mottled: No Pallor: No Pallor: No Rubor: No Rubor: No No Abnormality N/A No Abnormality Temperature: Wound Number: [id number] [phone] [id number] Photos: Right, Proximal, Posterior Lower Leg Left, Lateral Lower Leg Left, Posterior Lower Leg Wound Location: Blister Blister Blister Wounding 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 Primary Etiology: Chronic Obstructive Pulmonary Chronic Obstructive Pulmonary Chronic Obstructive Pulmonary Comorbid History: Disease (COPD), Sleep Apnea, Disease (COPD), Sleep Apnea, Disease (COPD), Sleep Apnea, Arrhythmia, Congestive Heart Failure, Arrhythmia, Congestive Heart Failure, Arrhythmia,  Congestive Heart Failure, Coronary Artery Disease, Coronary Artery Disease, Coronary Artery Disease, Hypertension, Peripheral Arterial Hypertension, Peripheral Arterial Hypertension, Peripheral Arterial Disease, Cirrhosis , Type II Diabetes, Disease, Cirrhosis , Type II Diabetes, Disease, Cirrhosis , Type II Diabetes, Osteoarthritis, Neuropathy Osteoarthritis, Neuropathy Osteoarthritis, Neuropathy 10/04/2023 10/04/2023 10/04/2023 Date Acquired: 0 0 0 Weeks of Treatment: Open Open Open Wound Status: No No No Wound Recurrence: Yes No No Clustered Wound: 4x1x0.1 1.7x2x0.1 0.6x0.8x0.1 Measurements L x W x D (cm) JERI, CAMPIONE ([id number]) [id number] Page 4 of 15 3.142 2.67 0.377 A (cm) : rea 0.314 0.267 0.038 Volume (cm) : N/A N/A N/A % Reduction in Area: N/A N/A N/A % Reduction in Volume: Full Thickness Without Exposed Full Thickness Without Exposed Full Thickness Without Exposed Classification: Support Structures Support Structures Support Structures Medium Medium Medium Exudate Amount: Serosanguineous Serosanguineous Serosanguineous Exudate Type: red, brown red, brown red, brown Exudate Color: N/A N/A N/A Wound Margin: Large (67-100%) Small (1-33%) Large (67-100%) Granulation Amount: Red Pink Pink Granulation Quality: None Present (0%) Large (67-100%) Small (1-33%) Necrotic Amount: Fascia: No N/A N/A Exposed Structures: Fat Layer (Subcutaneous Tissue): No Tendon: No Muscle: No Joint: No Bone: No None None  None Epithelialization: N/A N/A N/A Temperature: Treatment Notes Electronic Signature(Price) Signed: 10/04/2023 4:37:54 PM By: Baltazar Najjar MD Entered By: Baltazar Najjar on 10/04/2023 12:25:28 -------------------------------------------------------------------------------- Multi-Disciplinary Care Plan Details Patient Name: Date of Service: Karla Bigness, [name] 10/04/2023 2:00 PM Medical Record Number: [id number] Patient Account  Number: [id number] Date of Birth/Sex: Treating RN: 1961-07-07 (61 y.o. [name] Primary Care Cailen Texeira: [name] Other Clinician: Referring Rojelio Uhrich: Treating Edit Ricciardelli/Extender: [name] Weeks in Treatment: 24 Active Inactive Wound/Skin Impairment Nursing Diagnoses: Impaired tissue integrity Goals: Patient/caregiver will verbalize understanding of skin care regimen Date Initiated: 04/19/2023 Target Resolution Date: 10/19/2023 Goal Status: Active Interventions: Assess ulceration(Price) every visit Treatment Activities: Skin care regimen initiated : 04/19/2023 Notes: Electronic Signature(Price) Signed: 10/04/2023 5:07:51 PM By: Redmond Pulling RN, BSN Entered By: Redmond Pulling on 10/04/2023 12:08:12 [name] ([id number]) [id number] Page 5 of 15 -------------------------------------------------------------------------------- Price Assessment Details Patient Name: Date of Service: [name]. 10/04/2023 2:00 PM Medical Record Number: [id number] Patient Account Number: [id number] Date of Birth/Sex: Treating RN: 09/27/1961 (62 y.o. [name] Primary Care Annice Jolly: [name] Other Clinician: Referring Iliya Spivack: Treating Baylin Gamblin/Extender: [name] Weeks in Treatment: 24 Active Problems Location of Price Severity and Description of Price Patient Has Paino No Site Locations Price Management and Medication Current Price Management: Electronic Signature(Price) Signed: 10/04/2023 5:07:51 PM By: Redmond Pulling RN, BSN Entered By: Redmond Pulling on 10/04/2023 11:21:07 -------------------------------------------------------------------------------- Patient/Caregiver Education Details Patient Name: Date of Service: [name] 11/20/2024andnbsp2:00 PM Medical Record Number: [id number] Patient Account Number: [id number] Date of Birth/Gender: Treating RN: [birth year] (62 y.o. [name] Primary Care Physician: [name] Other Clinician: Referring Physician: Treating Physician/Extender: [name] in Treatment: 24 Education Assessment Education Provided To: Patient Education Topics Provided Wound/Skin Impairment: Methods: Explain/Verbal Responses: State content correctly [place] ([id number]) [id number] Page 6 of 15 Electronic Signature(Price) Signed: 10/04/2023 5:07:51 PM By: Redmond Pulling RN, BSN Entered By: Redmond Pulling on 10/04/2023 12:44:02 -------------------------------------------------------------------------------- Wound Assessment Details Patient Name: Date of Service: HA [name], [name] 10/04/2023 2:00 PM Medical Record Number: [id number] Patient Account Number: [id number] Date of Birth/Sex: Treating RN: [birth year] (61 y.o. [name] Primary Care Hiran Leard: [name] Other Clinician: Referring Haylen Bellotti: Treating Annibelle Brazie/Extender: [name] Weeks in Treatment: 24 Wound Status Wound Number: 23 Primary Diabetic Wound/Ulcer of the Lower Extremity Etiology: Wound Location: Right Metatarsal head first Wound Open Wounding Event: Gradually Appeared Status: Date Acquired: 04/18/2021 Comorbid Chronic Obstructive Pulmonary Disease (COPD), Sleep Apnea, Weeks Of Treatment: 24 History: Arrhythmia, Congestive Heart Failure, Coronary Artery Disease, Clustered Wound: No Hypertension, Peripheral Arterial Disease, Cirrhosis , Type II Diabetes, Osteoarthritis, Neuropathy Photos Wound Measurements Length: (cm) 1.6 Width: (cm) 1.4 Depth: (cm) 0.2 Area: (cm) 1.759 Volume: (cm) 0.352 % Reduction in Area: -86.7% % Reduction in Volume: 25.3% Epithelialization: None Tunneling: No Undermining: No Wound Description Classification: Grade 2 Wound Margin: Distinct, outline attached Exudate Amount: Medium Exudate Type: Serosanguineous Exudate Color: red,  brown Foul Odor After Cleansing: No Slough/Fibrino Yes Wound Bed Granulation Amount: Large (67-100%) Exposed Structure Granulation Quality: Pink Fascia Exposed: No Necrotic Amount: Small (1-33%) Fat Layer (Subcutaneous Tissue) Exposed: Yes Necrotic Quality: Adherent Slough Tendon Exposed: No Muscle Exposed: No Joint Exposed: No Bone Exposed: No Periwound Skin Texture Texture Color No Abnormalities Noted: No No Abnormalities Noted: Yes Callus: Yes Temperature / Price Temperature: No Abnormality Moisture [name], [name] ([id number]) [id number] Page 7 of 15 No Abnormalities Noted:  No Dry / Scaly: No Maceration: Yes Treatment Notes Wound #23 (Metatarsal head first) Wound Laterality: Right Cleanser Soap and Water Discharge Instruction: May shower and wash wound with dial antibacterial soap and water prior to dressing change. Wound Cleanser Discharge Instruction: Cleanse the wound with wound cleanser prior to applying a clean dressing using gauze sponges, not tissue or cotton balls. Peri-Wound Care Sween Lotion (Moisturizing lotion) Discharge Instruction: Apply moisturizing lotion as directed Topical Primary Dressing Maxorb Extra Ag+ Alginate Dressing, 2x2 (in/in) Discharge Instruction: Apply to wound bed as instructed Secondary Dressing ABD Pad, 8x10 Discharge Instruction: Apply over primary dressing as directed. Secured With [employer] 4x5 (in/yd) Discharge Instruction: Secure with Coban as directed. Kerlix Roll Sterile, 4.5x3.1 (in/yd) Discharge Instruction: Secure with Kerlix as directed. 32M Medipore H Soft Cloth Surgical T ape, 4 x 10 (in/yd) Discharge Instruction: Secure with tape as directed. Compression Wrap Compression Stockings Add-Ons Electronic Signature(Price) Signed: 10/04/2023 5:07:51 PM By: Redmond Pulling RN, BSN Entered By: Redmond Pulling on 10/04/2023  11:43:28 -------------------------------------------------------------------------------- Wound Assessment Details Patient Name: Date of Service: Karla Bigness, [name] 10/04/2023 2:00 PM Medical Record Number: [id number] Patient Account Number: [id number] Date of Birth/Sex: Treating RN: [birth year] (61 y.o. [name] Primary Care Kenson Groh: [name] Other Clinician: Referring Cammi Consalvo: Treating Tamsyn Owusu/Extender: [name] Weeks in Treatment: 24 Wound Status Wound Number: 29 Primary Trauma, Other Etiology: Wound Location: Right, Anterior Lower Leg Wound Open Wounding Event: Bump Status: Date Acquired: 08/20/2023 Comorbid Chronic Obstructive Pulmonary Disease (COPD), Sleep Apnea, Weeks Of Treatment: 6 History: Arrhythmia, Congestive Heart Failure, Coronary Artery Disease, Clustered Wound: No Hypertension, Peripheral Arterial Disease, Cirrhosis , Type II Diabetes, Osteoarthritis, Neuropathy Photos [name], [name] ([id number]) [id number] Page 8 of 15 Wound Measurements Length: (cm) 1.6 Width: (cm) 1.6 Depth: (cm) 0.1 Area: (cm) 2.011 Volume: (cm) 0.201 % Reduction in Area: -70.7% % Reduction in Volume: -70.3% Epithelialization: None Tunneling: No Undermining: No Wound Description Classification: Full Thickness Without Exposed Support Structures Wound Margin: Distinct, outline attached Exudate Amount: Medium Exudate Type: Serosanguineous Exudate Color: red, brown Foul Odor After Cleansing: No Slough/Fibrino Yes Wound Bed Granulation Amount: Medium (34-66%) Exposed Structure Granulation Quality: Red Fat Layer (Subcutaneous Tissue) Exposed: Yes Necrotic Amount: Medium (34-66%) Necrotic Quality: Adherent Slough Periwound Skin Texture Texture Color No Abnormalities Noted: No No Abnormalities Noted: No Callus: No Atrophie Blanche: No Crepitus: No Cyanosis: No Excoriation: No Ecchymosis: No Induration:  No Erythema: No Rash: No Hemosiderin Staining: Yes Scarring: No Mottled: No Pallor: No Moisture Rubor: No No Abnormalities Noted: No Dry / Scaly: No Maceration: No Treatment Notes Wound #29 (Lower Leg) Wound Laterality: Right, Anterior Cleanser Soap and Water Discharge Instruction: May shower and wash wound with dial antibacterial soap and water prior to dressing change. Wound Cleanser Discharge Instruction: Cleanse the wound with wound cleanser prior to applying a clean dressing using gauze sponges, not tissue or cotton balls. Peri-Wound Care Sween Lotion (Moisturizing lotion) Discharge Instruction: Apply moisturizing lotion as directed Topical Primary Dressing Maxorb Extra Ag+ Alginate Dressing, 2x2 (in/in) Discharge Instruction: Apply to wound bed as instructed Secondary Dressing ABD Pad, 8x10 Discharge Instruction: Apply over primary dressing as directed. Secured With [employer] 4x5 (in/yd) Discharge Instruction: Secure with Coban as directed. [name], [name] ([id number]) [id number] Page 9 of 15 Kerlix Roll Sterile, 4.5x3.1 (in/yd) Discharge Instruction: Secure with Kerlix as directed. 32M Medipore H Soft Cloth Surgical T ape, 4 x 10 (in/yd) Discharge Instruction: Secure with tape as directed. Compression Wrap Compression Stockings Add-Ons  Electronic Signature(Price) Signed: 10/04/2023 5:07:51 PM By: Redmond Pulling RN, BSN Entered By: Redmond Pulling on 10/04/2023 11:44:05 -------------------------------------------------------------------------------- Wound Assessment Details Patient Name: Date of Service: Karla Bigness, [name] 10/04/2023 2:00 PM Medical Record Number: [id number] Patient Account Number: [id number] Date of Birth/Sex: Treating RN: 29-Aug-1961 (61 y.o. [name] Primary Care Tameisha Covell: [name] Other Clinician: Referring Ondria Oswald: Treating Dakhari Zuver/Extender: [name] Weeks in  Treatment: 24 Wound Status Wound Number: 30 Primary Venous Leg Ulcer Etiology: Wound Location: Right, Distal, Posterior Lower Leg Wound Open Wounding Event: Gradually Appeared Status: Date Acquired: 09/06/2023 Comorbid Chronic Obstructive Pulmonary Disease (COPD), Sleep Apnea, Weeks Of Treatment: 4 History: Arrhythmia, Congestive Heart Failure, Coronary Artery Disease, Clustered Wound: No Hypertension, Peripheral Arterial Disease, Cirrhosis , Type II Diabetes, Osteoarthritis, Neuropathy Photos Wound Measurements Length: (cm) 0.5 Width: (cm) 0.5 Depth: (cm) 0.1 Area: (cm) 0.196 Volume: (cm) 0.02 % Reduction in Area: -66.1% % Reduction in Volume: -66.7% Epithelialization: None Tunneling: No Undermining: No Wound Description Classification: Full Thickness Without Exposed Support Structures Wound Margin: Distinct, outline attached Exudate Amount: Medium Exudate Type: Serosanguineous Exudate Color: red, brown Foul Odor After Cleansing: No Slough/Fibrino Yes Wound Bed Granulation Amount: Small (1-33%) Exposed Structure Granulation Quality: Red, Pink Fascia Exposed: No Necrotic Amount: Large (67-100%) Fat Layer (Subcutaneous Tissue) ExposedDALLY, [name] ([id number]) [id number] Page 10 of 15 Necrotic Quality: Adherent Slough Tendon Exposed: No Muscle Exposed: No Joint Exposed: No Bone Exposed: No Periwound Skin Texture Texture Color No Abnormalities Noted: No No Abnormalities Noted: No Callus: No Atrophie Blanche: No Crepitus: No Cyanosis: No Excoriation: No Ecchymosis: No Induration: No Erythema: No Rash: No Hemosiderin Staining: Yes Scarring: No Mottled: No Pallor: No Moisture Rubor: No No Abnormalities Noted: No Dry / Scaly: No Temperature / Price Maceration: No Temperature: No Abnormality Treatment Notes Wound #30 (Lower Leg) Wound Laterality: Right, Posterior, Distal Cleanser Soap and Water Discharge Instruction:  May shower and wash wound with dial antibacterial soap and water prior to dressing change. Wound Cleanser Discharge Instruction: Cleanse the wound with wound cleanser prior to applying a clean dressing using gauze sponges, not tissue or cotton balls. Peri-Wound Care Sween Lotion (Moisturizing lotion) Discharge Instruction: Apply moisturizing lotion as directed Topical Primary Dressing Maxorb Extra Ag+ Alginate Dressing, 2x2 (in/in) Discharge Instruction: Apply to wound bed as instructed Secondary Dressing ABD Pad, 8x10 Discharge Instruction: Apply over primary dressing as directed. Secured With [employer] 4x5 (in/yd) Discharge Instruction: Secure with Coban as directed. Kerlix Roll Sterile, 4.5x3.1 (in/yd) Discharge Instruction: Secure with Kerlix as directed. 97M Medipore H Soft Cloth Surgical T ape, 4 x 10 (in/yd) Discharge Instruction: Secure with tape as directed. Compression Wrap Compression Stockings Add-Ons Electronic Signature(Price) Signed: 10/04/2023 5:07:51 PM By: Redmond Pulling RN, BSN Entered By: Redmond Pulling on 10/04/2023 11:44:49 -------------------------------------------------------------------------------- Wound Assessment Details Patient Name: Date of Service: Karla Bigness, [name] 10/04/2023 2:00 PM Medical Record Number: [id number] Patient Account Number: [id number] Date of Birth/Sex: Treating RN: 10/04/1961 (61 y.o. [address] [place], Burke Price ([id number]) [id number] Page 11 of 15 Primary Care Deklin Bieler: [name] Other Clinician: Referring Catheryne Deford: Treating Masiyah Engen/Extender: [name] Weeks in Treatment: 24 Wound Status Wound Number: 31 Primary Venous Leg Ulcer Etiology: Wound Location: Right, Proximal, Posterior Lower Leg Wound Open Wounding Event: Blister Status: Date Acquired: 10/04/2023 Comorbid Chronic Obstructive Pulmonary Disease (COPD), Sleep Apnea, Weeks Of Treatment:  0 History: Arrhythmia, Congestive Heart Failure, Coronary Artery Disease, Clustered Wound: Yes Hypertension, Peripheral Arterial Disease, Cirrhosis , Type  II Diabetes, Osteoarthritis, Neuropathy Photos Wound Measurements Length: (cm) 4 Width: (cm) 1 Depth: (cm) 0.1 Area: (cm) 3.142 Volume: (cm) 0.314 % Reduction in Area: % Reduction in Volume: Epithelialization: None Tunneling: No Undermining: No Wound Description Classification: Full Thickness Without Exposed Support Structures Exudate Amount: Medium Exudate Type: Serosanguineous Exudate Color: red, brown Foul Odor After Cleansing: No Slough/Fibrino No Wound Bed Granulation Amount: Large (67-100%) Exposed Structure Granulation Quality: Red Fascia Exposed: No Necrotic Amount: None Present (0%) Fat Layer (Subcutaneous Tissue) Exposed: No Tendon Exposed: No Muscle Exposed: No Joint Exposed: No Bone Exposed: No Periwound Skin Texture Texture Color No Abnormalities Noted: No No Abnormalities Noted: No Moisture No Abnormalities Noted: No Treatment Notes Wound #31 (Lower Leg) Wound Laterality: Right, Posterior, Proximal Cleanser Soap and Water Discharge Instruction: May shower and wash wound with dial antibacterial soap and water prior to dressing change. Wound Cleanser Discharge Instruction: Cleanse the wound with wound cleanser prior to applying a clean dressing using gauze sponges, not tissue or cotton balls. Peri-Wound Care Sween Lotion (Moisturizing lotion) Discharge Instruction: Apply moisturizing lotion as directed Topical Primary Dressing DINELLE, SUDO ([id number]) [id number] Page 12 of 15 Maxorb Extra Ag+ Alginate Dressing, 2x2 (in/in) Discharge Instruction: Apply to wound bed as instructed Secondary Dressing ABD Pad, 8x10 Discharge Instruction: Apply over primary dressing as directed. Secured With [employer] 4x5 (in/yd) Discharge Instruction: Secure with Coban as  directed. Kerlix Roll Sterile, 4.5x3.1 (in/yd) Discharge Instruction: Secure with Kerlix as directed. 32M Medipore H Soft Cloth Surgical T ape, 4 x 10 (in/yd) Discharge Instruction: Secure with tape as directed. Compression Wrap Compression Stockings Add-Ons Electronic Signature(Price) Signed: 10/04/2023 5:07:51 PM By: Redmond Pulling RN, BSN Entered By: Redmond Pulling on 10/04/2023 11:45:34 -------------------------------------------------------------------------------- Wound Assessment Details Patient Name: Date of Service: Karla Bigness, [name] 10/04/2023 2:00 PM Medical Record Number: [id number] Patient Account Number: [id number] Date of Birth/Sex: Treating RN: 14-Nov-1961 (61 y.o. [name] Primary Care Oline Belk: [name] Other Clinician: Referring Kedric Bumgarner: Treating Sherrilyn Nairn/Extender: [name] Weeks in Treatment: 24 Wound Status Wound Number: 32 Primary Venous Leg Ulcer Etiology: Wound Location: Left, Lateral Lower Leg Wound Open Wounding Event: Blister Status: Date Acquired: 10/04/2023 Comorbid Chronic Obstructive Pulmonary Disease (COPD), Sleep Apnea, Weeks Of Treatment: 0 History: Arrhythmia, Congestive Heart Failure, Coronary Artery Disease, Clustered Wound: No Hypertension, Peripheral Arterial Disease, Cirrhosis , Type II Diabetes, Osteoarthritis, Neuropathy Photos Wound Measurements Length: (cm) 1.7 Width: (cm) 2 Depth: (cm) 0.1 Area: (cm) 2.67 Volume: (cm) 0.267 % Reduction in Area: % Reduction in Volume: Epithelialization: None Tunneling: No Undermining: No Wound Description Classification: Full Thickness Without Exposed Support Structures [name], [name] ([id number]) Exudate Amount: Medium Exudate Type: Serosanguineous Exudate Color: red, brown Foul Odor After Cleansing: No [id number] Page 13 of 15 Slough/Fibrino Yes Wound Bed Granulation Amount: Small (1-33%) Granulation Quality:  Pink Necrotic Amount: Large (67-100%) Necrotic Quality: Adherent Slough Periwound Skin Texture Texture Color No Abnormalities Noted: No No Abnormalities Noted: No Moisture No Abnormalities Noted: No Treatment Notes Wound #32 (Lower Leg) Wound Laterality: Left, Lateral Cleanser Soap and Water Discharge Instruction: May shower and wash wound with dial antibacterial soap and water prior to dressing change. Wound Cleanser Discharge Instruction: Cleanse the wound with wound cleanser prior to applying a clean dressing using gauze sponges, not tissue or cotton balls. Peri-Wound Care Sween Lotion (Moisturizing lotion) Discharge Instruction: Apply moisturizing lotion as directed Topical Primary Dressing Maxorb Extra Ag+ Alginate Dressing, 2x2 (in/in) Discharge Instruction: Apply to wound bed as instructed Secondary Dressing  ABD Pad, 8x10 Discharge Instruction: Apply over primary dressing as directed. Secured With [employer] 4x5 (in/yd) Discharge Instruction: Secure with Coban as directed. Kerlix Roll Sterile, 4.5x3.1 (in/yd) Discharge Instruction: Secure with Kerlix as directed. 81M Medipore H Soft Cloth Surgical T ape, 4 x 10 (in/yd) Discharge Instruction: Secure with tape as directed. Compression Wrap Compression Stockings Add-Ons Electronic Signature(Price) Signed: 10/04/2023 5:07:51 PM By: Redmond Pulling RN, BSN Entered By: Redmond Pulling on 10/04/2023 11:46:14 -------------------------------------------------------------------------------- Wound Assessment Details Patient Name: Date of Service: Karla Bigness, [name] 10/04/2023 2:00 PM Medical Record Number: [id number] Patient Account Number: [id number] Date of Birth/Sex: Treating RN: [birth year] (61 y.o. [name] Primary Care Beaumont Austad: [name] Other Clinician: Referring Daymein Nunnery: Treating Jhene Westmoreland/Extender: Ileta, Sadosky ([id number])  [id number] Page 14 of 15 Weeks in Treatment: 24 Wound Status Wound Number: 33 Primary Venous Leg Ulcer Etiology: Wound Location: Left, Posterior Lower Leg Wound Open Wounding Event: Blister Status: Date Acquired: 10/04/2023 Comorbid Chronic Obstructive Pulmonary Disease (COPD), Sleep Apnea, Weeks Of Treatment: 0 History: Arrhythmia, Congestive Heart Failure, Coronary Artery Disease, Clustered Wound: No Hypertension, Peripheral Arterial Disease, Cirrhosis , Type II Diabetes, Osteoarthritis, Neuropathy Photos Wound Measurements Length: (cm) 0.6 Width: (cm) 0.8 Depth: (cm) 0.1 Area: (cm) 0.377 Volume: (cm) 0.038 % Reduction in Area: % Reduction in Volume: Epithelialization: None Tunneling: No Undermining: No Wound Description Classification: Full Thickness Without Exposed Support Structures Exudate Amount: Medium Exudate Type: Serosanguineous Exudate Color: red, brown Foul Odor After Cleansing: No Slough/Fibrino Yes Wound Bed Granulation Amount: Large (67-100%) Granulation Quality: Pink Necrotic Amount: Small (1-33%) Necrotic Quality: Adherent Slough Periwound Skin Texture Texture Color No Abnormalities Noted: No No Abnormalities Noted: No Moisture No Abnormalities Noted: No Treatment Notes Wound #33 (Lower Leg) Wound Laterality: Left, Posterior Cleanser Soap and Water Discharge Instruction: May shower and wash wound with dial antibacterial soap and water prior to dressing change. Wound Cleanser Discharge Instruction: Cleanse the wound with wound cleanser prior to applying a clean dressing using gauze sponges, not tissue or cotton balls. Peri-Wound Care Sween Lotion (Moisturizing lotion) Discharge Instruction: Apply moisturizing lotion as directed Topical Primary Dressing Maxorb Extra Ag+ Alginate Dressing, 2x2 (in/in) Discharge Instruction: Apply to wound bed as instructed Secondary Dressing ABD Pad, 8x10 [name], [name] ([id number])  [id number].pdf Page 15 of 15 Discharge Instruction: Apply over primary dressing as directed. Secured With [employer] 4x5 (in/yd) Discharge Instruction: Secure with Coban as directed. Kerlix Roll Sterile, 4.5x3.1 (in/yd) Discharge Instruction: Secure with Kerlix as directed. 81M Medipore H Soft Cloth Surgical T ape, 4 x 10 (in/yd) Discharge Instruction: Secure with tape as directed. Compression Wrap Compression Stockings Add-Ons Electronic Signature(Price) Signed: 10/04/2023 5:07:51 PM By: Redmond Pulling RN, BSN Entered By: Redmond Pulling on 10/04/2023 11:47:24 -------------------------------------------------------------------------------- Vitals Details Patient Name: Date of Service: Karla Bigness, [name] 10/04/2023 2:00 PM Medical Record Number: [id number] Patient Account Number: [id number] Date of Birth/Sex: Treating RN: Apr 15, 1961 (61 y.o. [name] Primary Care Lorraine Cimmino: [name] Other Clinician: Referring Grier Czerwinski: Treating Xayden Linsey/Extender: [name] Weeks in Treatment: 24 Vital Signs Time Taken: 14:42 Temperature (F): 98.6 Height (in): 64 Pulse (bpm): 65 Weight (lbs): 225 Respiratory Rate (breaths/min): 18 Body Mass Index (BMI): 38.6 Blood Pressure (mmHg): 138/80 Reference Range: 80 - 120 mg / dl Electronic Signature(Price) Signed: 10/04/2023 5:07:51 PM By: Redmond Pulling RN, BSN Entered By: Redmond Pulling on 10/04/2023 11:42:20

## 2023-10-05 NOTE — Progress Notes (Addendum)
[name], [name] ([id number]) [phone].pdf Page 1 of 15 Visit Report for 10/04/2023 HPI Details Patient Name: Date of Service: [name] 10/04/2023 2:00 PM Medical Record Number: [id number] Patient Account Number: [id number] Date of Birth/Sex: Treating RN: November 16, 1960 (62 y.o. F) Primary Care Provider: [name] Other Clinician: Referring Provider: Treating Provider/Extender: Josephine Cables Weeks in Treatment: 24 History of Present Illness HPI Description: This 62 year old patient who has a very long significant history of diabetes mellitus, previous alcohol and nicotine abuse, chronic data disease, COPD, diabetes mellitus, height hypertension, critical lower limb ischemia with several wounds being managed at the wound Center at [facility] for over a year. She was recently in the ER at [facility] and was referred to our center. He has had a long history of critical limb ischemia and over a period of time has had balloon angioplasties in March 2016, endarterectomy of the left carotid, lower extremity angiogram and treatment by Dr. Nanetta Batty, several cardiac catheterizations. Most recently she had an x-ray of her left foot while in the ER which showed no acute abnormality. during this ER visit she was started on ciprofloxacin and asked to continue with the wound care physicians at [facility]. Her last ABI done in June 2017 showed the right side to be 0.28 in the left side to be 0.48. Her right toe brachial index was 0.17 on the right and 0.27 on the left. her last hemoglobin A1c was 11.1% She continues to smoke about a pack of cigarettes a day. 03/28/2017 -- -- right foot x-ray -- IMPRESSION:Areas of soft tissue swelling. Mild subluxation second PIP joint. No frank dislocation or fracture. No erosive change or bony destruction. No soft tissue ulceration or radiopaque foreign body evident by radiography. There is plantar  fascia calcification with a nearby inferior calcaneal spur. There are foci of arterial vascular calcification. 04/04/2017 -- the patient continues to be noncompliant and continues to complain of a lot of pain and has not done anything about quitting smoking Readmission: 06/26/18 on evaluation today patient presents for reevaluation she has not been seen in our office since May 2018. Since she was last seen here in our office she has undergone testing at Dr. Durenda Age office where it was revealed that she had an ABI of 0.68 with her previous ABI being 0.64 and a left ABI of 0.38 with previous ABI being 0.34 this study which was performed on 04/05/18 was pretty much equivalent to the study performed on 11/22/17. The findings in the end revealed on the right would appear to be moderate right lower extremity arterial disease on the left Dr. Allyson Sabal states moderate in the report but unfortunately this appears to be much more severe at 0.38 compared to the right. Subsequently the patient was scheduled to have angiography with Dr. Allyson Sabal on 04/12/18. This was however canceled due to the fact that the patient was found to have chronic kidney disease stage III and it was to the point that he did not feel that it will be safe to pursue angiography at that point. She has not been on any antibiotics recently. At this point Dr. Allyson Sabal has not rescheduled anything as far as the injured [place] according to the patient he is somewhat reluctant to do so. Nonetheless with her diminished blood flow this is gonna make it somewhat difficult for her to heal. 07/05/18; this is a patient I have not seen previously. She has very significant PAD as noted above. She  apparently has had revascularization efforts by Dr. Allyson Sabal on the right on 3 occasions to the patient. She was supposed to have an attempted angiography on the left however this was canceled apparently because of stage III chronic renal failure. I will need to research  all of this. She complains of significant pain in the wound and has claudication enough that when she walks to the end of the driveway she has to stop. She is been using silver alginate to the wounds on her legs and Iodoflex to the area on her left second toe. 07/13/18 on evaluation today patient's wounds actually appear to be doing about the same. She has an appointment she tells me within the next month that is September 2019 with Dr. Allyson Sabal to discuss options to see if there's anything else that you can recommend or do for her. Nonetheless obviously what we're trying to do is do what we can to save her leg and in turn prevent any additional worsening or damage. None in the meantime we been mainly trying to manage her ulcers as best we can. 07/27/18; some improvement in the multiplicity of wounds on her left lower extremity and foot. She's been using silver alginate. I was unable to determine that she actually has an appointment with Dr. Allyson Sabal. We are checking into this The patient's wound includes Left lateral foot, left plantar heel, her left anterior calf wound looks close to me, left fourth toe is very close to closed and the left medial calf is perhaps the largest open area READMISSION 02/19/2021 This is a now 62 year old woman with type 2 diabetes and continued cigarette smoking. She has known PAD. She was last in this clinic in 2019 at that point with wounds on her left foot. She left in a nonhealed state. On 06/28/2019 I see she had a left femoropopliteal by vein and vascular with an amputation of the fifth ray. These managed to heal over. Her last arterial studies were in February 2021. This showed an absent waveform at the posterior tibial artery on the right a dampened monophasic dorsalis pedis on the right of 0.44. On the left again the PTA TA was absent her dorsalis pedis was 0.99 triphasic and her great toe pressure was 0.65. This would have been after her revascularization. Once again  she tells me that Dr. Allyson Sabal has done revascularization on the right leg on 3 different occasions. She has a right transmetatarsal amputation apparently done by Dr. Lajoyce Corners remotely but I do not see a note for these right lower extremity revascularization but I may not of looked back far enough. In any case she says that the she has a wound on the plantar aspect of the right transmetatarsal amputation site there is been there for several months. More recently she fell and had a wound on her right medial lower leg she had 5 sutures placed in the ER and then subsequently she has developed an area on the medial lower leg which was a blister that opened up. Past medical history includes type 2 diabetes, PAD, chronic renal failure, congestive heart failure, right transmetatarsal amputation, left fifth ray amputation, continued tobacco abuse, atrial fibrillation and cirrhosis. We did not attempt an ABI on the right leg today because of pain 02/26/2021; patient we admitted to the clinic last week. She has what looks to be 2 areas on her right medial and right anterior lower leg that look more like [name], [name] ([id number]) [id number] Page 2 of 15 venous wounds but  she has 1 on the first met head at the base of her right transmet. She has known severe PAD. She complains of a lot of pain although some of this may be neuropathic. Is difficult to exclude a component of claudication. Use silver alginate on the wounds on her legs and Iodoflex on the area on the first met head. She has an appointment with Dr. Allyson Sabal on 5/25 although I will text him and discussed the situation. She is have poorly controlled diabetic. She has has stage IIIb chronic renal failure 4/22; patient presents for 1 week follow-up. The 2 areas on her right medial and right anterior lower leg appear well-healing. She has been using silver alginate to this area without issues. She has a first met head ulcer and it is  unclear how long this has been there as it was discovered in the ED earlier this month when she was being evaluated for another issue. Iodoflex has been used at this area. 4/29; patient presents for 1 week follow-up. She has been using silver alginate to the leg wounds and Iodoflex to the plantar foot wound. She has had this wrapped with Coban and Kerlix. She has no concerns or complaints today. 5/6; this is a difficult wound on the right plantar foot transmit site. We are not making a lot of progress. She had 2 more venous looking wounds on the right medial and right anterior lower leg 1 of which is healed. Her appointment with Dr. Allyson Sabal is on 5/25 5/16; she did not tolerate the offloading shoe we gave her in fact she had a fall with an abrasion on her right forearm she is now back in the modified small shoe which does not offload her foot properly. Her appoint with Dr. Allyson Sabal is still on 5/25 we have been using Iodoflex. The area on the right anterior lower leg is healed 7/18; patient presents for follow-up however has not been seen in 2 months. She was last seen at the end of May. She had a fall at the end of June and was hospitalized. She has been unable to follow-up since then. For the wound she has only been keeping it covered with gauze. She is scheduled for an aortogram with lower extremity runoff at the end of July. 7/29; patient presents for 2-week follow-up. She has home health and they have been changing her dressings. She denies any issues and has no complaints today. She states she had a procedure where they opened up one of her vessels in her legs. She denies signs of infection. 8/5; patient presents for follow-up. She was recently in the hospital for bradycardia. She was noted to have cellulitis to the left lower extremity and Unna boot was placed due to blisters and increased swelling. She reports improvement to her left leg since being in the hospital and stability check her right  plantar foot wound. She denies signs of infection. 8/23; since I last saw this patient a lot has happened. She still has the area over the right first met head in the setting of previous transmetatarsal amputation. Firstly most importantly she underwent revascularization of her occluded right SFA. She underwent directional atherectomy followed by drug-coated balloon angioplasty. She has no named vessel below the knee hopefully the revascularization of the right SFA will improve collateral flow. It is not felt however that she has any endovascular options. She had follow-up arterial studies noninvasive on 8/9. These showed an ABI of 0.44 at the right PTA. Monophasic waveforms. On  the left her great toe ABI was 0.68. Her follow-up arterial Doppler showed a 50 to 74% stenosis in the proximal SFA and a 50 to 74% stenosis in the distal SFA mild to severe atherosclerosis noted throughout the extremity. Areas of shadowing plaque seen, unable to rule out higher grade stenosis she also had a fall apparently wearing a right foot forefoot off loader that we gave her. She therefore comes in in an ordinary running shoe today. Not certain if this is the fall that ended up in the hospital with bradycardia 9/6 area over the right first metatarsal head in the setting of her previous amputation and severe PAD. She is also a diabetic with known PAD status post attempt at revascularization. She is continued smoker Again the separation of visits in the clinic is somewhat disturbing if we are going to consider her for a total contact cast. She is not wearing anything to offload this stating it causes imbalance especially the forefoot off loader. She basically comes in in bedroom slippers She also had a fall this morning about an hour ago. She has a skin tear on the left dorsal forearm 9/13; areas over the right first metatarsal head in the setting of previous TMA and severe PAD. Again she comes in here in slippers. We  have been using Hydrofera Blue. We use MolecuLight on this which was essentially negative study 9/20; right first metatarsal head in the setting of her previous TMA and severe PAD. Same slipper type shoes. She says she cannot wear a forefoot off loader and for some reason she will not wear surgical shoes. She says she is smoking half a pack per day. 9/30; right first metatarsal head. Absolutely no improvement in fact there is tunneling superiorly very close to bone. She does not offload this properly at all. We use MolecuLight on this 2 weeks ago that did not show any surface bacteria we have been using silver collagen is a dressing She tells me she is down to 3 cigarettes a day I have offered encouragement and a treatment plan 10/6; right first metatarsal head. Exposed bone this week which is not surprising. We have been using silver collagen. She is smoking 5 to 10 cigarettes a day 10/17; she has not been here in 10 days. The bone scraping that I did on 10/6 showed staph lugdunensis, staph epidermidis and Pseudomonas Alcalifenes. I put in for Septra DS 1 p.o. twice daily for 14 days last week would be but we could not reach her to start the antibiotic. Quinolones would have been a good alternative except she has multiple drug interactions. Septra would not cover what ever the Pseudomonas is but I am not really sure of the relevance here. I am going to try her on the Septra for 2 weeks. She has a probing wound on the right TMA that probes to bone. She claims to have just about stop smoking I reviewed her arterial status. She underwent a left fem-tib bypass by Dr. Arbie Cookey in 06/28/2019. Her last angiogram was in July of this year by Dr. Allyson Sabal. This showed an occluded right SFA the popliteal and tibial vessels were also occluded the peroneal vessel filled by collaterals and filled the PT and DP by communicating collaterals. She was not felt to have any additional and vascular options. Dr. Allyson Sabal noted that  she he had previously revascularized her right SFA 3 times. 10/25; she is tolerating the Septra but says it makes her nauseated. I am going to continue  this for an additional 2 weeks perhaps for a total of 6 weeks. Her wound does not look too much different. There is on the right first met metatarsal head. There is probing areas around the tissue that is in the middle of the wound. There is no purulence I cannot feel bone. The original source was for a bone scraping. 11/8; right first metatarsal head. This does not probe to bone and there is no purulence. As far as I can tell she is completed the Septra although there were problems with exact length of time and I think compliance. In any case I gave her 6 weeks. I have reviewed her PAD above. 11/15; right first metatarsal head. Again protruding subcutaneous tissue but this does not have any adherence to surrounding skin. Undermining circumferentially. There is no palpable bone. Not grossly purulent. We have been using Iodoflex to try to get some adherence to clean off the surface but no real improvement. I do not think they actually had enough supplies listening to the patient. She completed the 6 weeks of Septra I gave her, but I am not sure about the compliance with the dates i.e. may have missed days taken 1 a day etc. The patient has a vascular follow-up with Dr. Allyson Sabal this coming Friday i.e. in 3 days. By my read of arterial evaluations I do not think she is felt to be a candidate for any further revascularization. The last angiogram was in July. 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 She continues to smoke now up to 10 cigarettes a day. She is not in any pain. Wound has not improved but is certainly not worsened. I gave her 6 weeks of trimethoprim/sulfamethoxazole which she is completed in some fashion. 11/29 right first metatarsal head previous TMA. Wound actually looks somewhat better today still a lot of callus around  the wound on debris on the wound surface. UNFORTUNATELY the patient missed her appointment with Dr. Allyson Sabal and is not rebooked apparently until sometime in February 12/13; right first metatarsal wound actually looks some smaller today. I did not debride this today. She is using Iodoflex. When she came in last week she had a large abscess on her left anterior lower leg apparently after falling we send her to the ER to have this drained which was done. I cannot see any cultures x-ray was negative. She was apparently given 2 doses of IV antibiotics and discharged on Bactrim DS twice daily which she is still taking. As mentioned I cannot see any cultures. 12/29; culture of the abscess on the left leg I did last time showed Serratia. We gave her antibiotics. 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 She reports a callus to the right first met head and on the lateral aspect of the left foot. She saw podiatry 3 days ago and had the callus debrided. There are no open wounds at this time. She follows up with podiatry in 2 weeks. Readmission: 04-19-2023 upon evaluation today patient appears to be doing well all things considered in regard to her wounds. She does have  several wounds however on her feet 1 on the left foot centrally along the lateral edge and then a wound on the right lower leg more posterior. On the right foot plantar distal at the transmetatarsal amputation site this is the worst. With that being said she tells me this has been going on for quite some time she has been seen by  podiatry they have done some debridements in the past. With that being said based on what we are seeing I believe that the patient's primary issue on the right side is that she is having some issues here with her arterial flow. The ABI on the right is 0.57 on the left is 1.04. With that being said this reading on the right is getting necessitated further evaluation by vascular. She tells me she has been seen by Dr. Gery Pray in the past and he stated there was nothing further to be done for the right leg. With that being said I think that it would be good to have a second opinion with vascular on this. Patient does have a history of diabetes mellitus type 2, peripheral vascular disease, and hypertension. The previous ABIs were performed on January 11, 2023. 04-26-2023 upon evaluation today patient appears to be doing about the same in regard to her wounds. She still has not had the arterial study at this point. I think this is something needs to be done as quickly as possible. Fortunately I do not see any evidence of active infection locally or systemically which is great news. No fevers, chills, nausea, vomiting, or diarrhea. 7/24; the patient has not been here in about a month and a half. She has open wounds on the plantar right foot second metatarsal head in the setting of a previous TMA, she has a new area on the left lateral foot and perhaps traumatic areas on the right anterior and left lateral lower legs. She has poorly controlled edema. The patient has known severe PAD has had multiple interventions in the right SFA. 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 She had an pressure of 60 at the dorsalis pedis giving her an ABI of 0.57 monophasic waveforms here. On the left her study was a lot better with an ABI 1.04 triphasic waveforms. The ABI might be falsely elevated on the left. I  think at her last angiogram she had occluded right SFA popliteal and tibial vessels with the peroneal vessel being the only vessel feeding her foot via collaterals. She comes in this time in basic over-the-counter shoes 06-14-2023 upon evaluation today patient appears to be doing well currently in regard to her wounds all things considered except for the left lateral foot where she has what appears to be a blister this is erythematous as well and concerned about infection. I discussed with her today that I do think we need to see about doing the Vi core culture and subsequently depending on the results of this we will initiate antibiotic treatment as necessary. With that being said right now I think that she is going to need to be aggressive and elevating her legs she has an appointment with vascular coming up around 3 August is what I believe her appointment date is. Nonetheless once we get clearance from the standpoint of vascular she does have good blood flow and we can subsequently try to see what we do about getting her edema  under much better control and then she really needs some much stronger compression than what we currently are able to do. 06-21-2023 upon evaluation today patient appears to be doing pretty well currently in regard to her wounds most of which seem to be showing signs of improvement. She has been require little bit of debridement in regard to the right plantar foot wound I will do this as carefully as possible as we are still working on establishing that she has sufficient blood flow at this point. This right side on previous testing seem to be somewhat questionable. Fortunately I do not see any signs of active infection locally nor systemically at this time. 06-28-2023 patient does have an appointment with vascular on 07-11-2023. With that being said right now she has been having some slow progress but still nonetheless good progress at this point. I am actually very pleased with  where we stand. I do not see any signs of active infection locally or systemically which is great news. No fevers, chills, nausea, vomiting, or diarrhea. 07-05-2023 upon evaluation today patient appears to be doing well currently in regard to her wound. She has been tolerating the dressing changes. She only has 1 wound left on the plantar aspect of the right foot. With that being said she does have a peg assist offloading shoe she has been using at this time. 8/28; patient with a wound over the plantar right foot metatarsal head. She was seen by Dr. Lenell Antu of vascular surgery. Noted that the right ABI was only 0.57 it was felt that she had progression versus a previous study. She had some stenosis in the SFA a 7599% stenosis in the proximal mid SFA mild stenosis in the distal SFA and mild stenosis in the proximal PTA. On the left she had a patent left femoral to ATA bypass graft with mild stenosis It was recommended that she undergo angiography for limb salvage on the right. That is being arranged as we speak. Her wound is on the right first metatarsal head she has been using collagen as the primary dressing. She tells me she is making every effort to stop smoking 07-19-2023 upon evaluation today patient appears to be doing okay in regard to her foot ulcer although her arterial procedure/revascularization keeps getting pushed back. She states that they are talking about pushing back and left nail due to the fact that she changed insurances and I do not have a being "updated insurance card number". With that being said I explained to the patient that should be something that she should be able to get on line easily by making an account. She is also called [place] [facility] and they said they were sending it but it would be about a week before described by next Wednesday. She really does not have time to be holding on this she needs to be proceeding with intervention as quickly as possible. 07-26-2023 upon  evaluation today patient appears to be doing well currently in regard to her wound. This is actually showing signs of looking about the same I did clear a lot of callus up last week this actually looks better to me from an overall visual standpoint than last week though it is a little larger because of the callus I had removed. With that being said I feel like she is actually making really good progress here towards complete closure. No fevers, chills, nausea, vomiting, or diarrhea. 08-09-2023 upon evaluation today patient presents for follow-up concerning her ongoing issues with her  right plantar foot ulcer. She is going to be having arteriogram on Friday that is just just 2 days and hopefully following this it will actually allow for more aggressive healing in regard to the right plantar foot. Today I am going to perform some debridement to clear away some of the necrotic debris. 08-23-2023 upon evaluation patient's wound really appears to be doing about the same. She did have her arteriogram with Dr. Lenell Antu and that was on 9-27- 2024. Unfortunately it was noted that the patient had no good options for further revascularization. They plan to reevaluate there in a month but she is stated to be at "high risk for major amputation". With that being said it does appear they were able to do stenting of the right femoral-popliteal artery but unfortunately they were unable to cross the TP trunk and proximal peroneal occlusion. Subsequently this means that there is really not good blood flow into the ankle and foot which means that the patient really does not have a good chance of getting this to heal. 08-30-2023 upon evaluation today patient appears to be doing well currently in regard to her wound. She has been tolerating the dressing changes without complication and in general I do feel that the patient is doing quite well at this time. Unfortunately with the blood flow being so low and really no real  vascular options to improve blood flow she really has to try to stay off of this is much as possible. 09-06-2023 upon evaluation today patient appears to be doing well currently in regard to the wound on her plantar foot it does not seem to be any worse is not terribly better but again there is really not much we can do from a blood flow standpoint at this time unfortunately. There does not appear to be any signs of active infection locally or systemically which is good news. She does have a wound on the posterior ankle location and the right leg as well. She also has an area on the left lateral foot. 09-13-2023 upon evaluation today patient appears to be doing decently well all things considered in regard to her wounds. Fortunately I do not see any signs of worsening overall I do believe that the patient is making good headway towards closure which is good news. No fevers, chills, nausea, vomiting, or diarrhea. [name], [name] ([id number]) [phone].pdf Page 4 of 15 11/20; since the patient was last here she was admitted to hospital from 11/9 through 09/27/2023. She had atrial fibrillation with rapid ventricular response she underwent successful cardioversion to normal sinus rhythm. She has noted to have stage III chronic renal failure, heart failure with preserved ejection fraction. She was discharged home apparently with a furosemide infusion pump although I do not see that on her discharge summary. She is listed as having torsemide to 100 mg/day. Unfortunately in any case she is gaining weight quite substantially per description of the patient. She has significant bilateral lower extremity edema but nonpitting. She has no open wounds on the left leg and right leg. She was not discharged with any home health and I am not really clear anybody is dressing these wounds. She has known PAD She has the original wounds on the right first met head and right posterior Achilles area.  New wounds on the right anteriorly and right posteriorly and on the left laterally and posteriorly. All her wounds are draining clear edema fluid Electronic Signature(s) Signed: 10/04/2023 4:37:54 PM By: Baltazar Najjar MD Entered By: Leanord Hawking,  Patricia Fargo on 10/04/2023 15:30:18 -------------------------------------------------------------------------------- Physical Exam Details Patient Name: Date of Service: [name] 10/04/2023 2:00 PM Medical Record Number: [id number] Patient Account Number: [id number] Date of Birth/Sex: Treating RN: January 19, 1961 (62 y.o. F) Primary Care Provider: [name] Other Clinician: Referring Provider: Treating Provider/Extender: Josephine Cables Weeks in Treatment: 24 Constitutional Sitting or standing Blood Pressure is within target range for patient.. Pulse regular and within target range for patient.[name] Respirations regular, non-labored and within target range.. Temperature is normal and within the target range for the patient.[name] Appears in no distress. Cardiovascular Dorsalis pedis pulses are palpable at the dorsalis pedis however she is known significant PAD apparently nonrevascularizable.. Edema present in both extremities.This appears to be mostly nonpitting. Notes Wound exam; she has area over her first met head which actually appears fairly clean but I guess is unchanged per description of her nurses. Posterior right Achilles area she has a new wound on the right anterior tibial anteriorly wounds on the left lateral and left posterior calf. All of these are weeping edema fluid. Electronic Signature(s) Signed: 10/04/2023 4:37:54 PM By: Baltazar Najjar MD Entered By: Baltazar Najjar on 10/04/2023 15:31:56 -------------------------------------------------------------------------------- Physician Orders Details Patient Name: Date of Service: [name], [name] 10/04/2023 2:00 PM Medical Record Number: [id number] Patient Account  Number: [id number] Date of Birth/Sex: Treating RN: May 24, 1961 (61 y.o. [name] Primary Care Provider: [name] Other Clinician: Referring Provider: Treating Provider/Extender: Josephine Cables Weeks in Treatment: 24 Verbal / Phone Orders: No Diagnosis Coding Follow-up Appointments ppointment in 2 weeks. - Dr Leanord Hawking Return A Return appointment in 3 weeks. - Dr Leanord Hawking Nurse Visit: Jake Shark or wednesday Anesthetic [name], [name] ([id number]) [id number] Page 5 of 15 (In clinic) Topical Lidocaine 5% applied to wound bed Bathing/ Shower/ Hygiene May shower with protection but do not get wound dressing(s) wet. Protect dressing(s) with water repellant cover (for example, large plastic bag) or a cast cover and may then take shower. Edema Control - Orders / Instructions Elevate legs to the level of the heart or above for 30 minutes daily and/or when sitting for 3-4 times a day throughout the day. Avoid standing for long periods of time. Off-Loading Open toe surgical shoe to: - with Peg Assist in shoe. Limit walking on foot. Stay off the foot to minimize pressure to bottom of foot. Additional Orders / Instructions Stop/Decrease Smoking Follow Nutritious Diet Non Wound Condition Left Lower Extremity pply the following to affected area as directed: - left foot- apply betadine to affected area cover with ABD pad, secure with kerlix tubigrip size E. A Home Health Wound #23 Right Metatarsal head first Admit to Home Health for skilled nursing wound care. May utilize formulary equivalent dressing for wound treatment orders unless otherwise specified. - HH once per week, to change wound dressing and wraps: silver alginate, ABD, kerlix/coban bilateral lower legs Other Home Health Orders/Instructions: - [facility] Wound #29 Right,Anterior Lower Leg Admit to Home Health for skilled nursing wound care. May utilize formulary  equivalent dressing for wound treatment orders unless otherwise specified. - HH once per week, to change wound dressing and wraps: silver alginate, ABD, kerlix/coban bilateral lower legs Other Home Health Orders/Instructions: - [facility] Wound #30 Right,Distal,Posterior Lower Leg Admit to Home Health for skilled nursing wound care. May utilize formulary equivalent dressing for wound treatment orders unless otherwise specified. - HH once per week, to change wound dressing and wraps: silver alginate, ABD, kerlix/coban bilateral lower  legs Other Home Health Orders/Instructions: - [facility] Wound #31 Right,Proximal,Posterior Lower Leg Admit to Home Health for skilled nursing wound care. May utilize formulary equivalent dressing for wound treatment orders unless otherwise specified. - HH once per week, to change wound dressing and wraps: silver alginate, ABD, kerlix/coban bilateral lower legs Other Home Health Orders/Instructions: - [facility] Wound #32 Left,Lateral Lower Leg Admit to Home Health for skilled nursing wound care. May utilize formulary equivalent dressing for wound treatment orders unless otherwise specified. - HH once per week, to change wound dressing and wraps: silver alginate, ABD, kerlix/coban bilateral lower legs Other Home Health Orders/Instructions: - [facility] Wound #33 Left,Posterior Lower Leg Admit to Home Health for skilled nursing wound care. May utilize formulary equivalent dressing for wound treatment orders unless otherwise specified. - HH once per week, to change wound dressing and wraps: silver alginate, ABD, kerlix/coban bilateral lower legs Other Home Health Orders/Instructions: - [facility] Wound Treatment Wound #23 - Metatarsal head first Wound Laterality: Right Cleanser: Soap and Water 3 x Per Week/30 Days Discharge Instructions: May shower and wash wound with dial antibacterial soap and water prior to dressing  change. Cleanser: Wound Cleanser 3 x Per Week/30 Days Discharge Instructions: Cleanse the wound with wound cleanser prior to applying a clean dressing using gauze sponges, not tissue or cotton balls. Peri-Wound Care: Sween Lotion (Moisturizing lotion) 3 x Per Week/30 Days Discharge Instructions: Apply moisturizing lotion as directed Prim Dressing: Maxorb Extra Ag+ Alginate Dressing, 2x2 (in/in) 3 x Per Week/30 Days ary Discharge Instructions: Apply to wound bed as instructed Secondary Dressing: ABD Pad, 8x10 3 x Per Week/30 Days Discharge Instructions: Apply over primary dressing as directed. Secured With: Coban Self-Adherent Wrap 4x5 (in/yd) 3 x Per Week/30 Days Discharge Instructions: Secure with Coban as directed. Secured With: [employer], 4.5x3.1 (in/yd) 3 x Per Week/30 Days Discharge Instructions: Secure with Kerlix as directed. Secured With: 69M Medipore H Soft Cloth Surgical T ape, 4 x 10 (in/yd) 3 x Per Week/30 Days Discharge Instructions: Secure with tape as directed. Wound #29 - Lower Leg Wound Laterality: Right, Anterior Cleanser: Soap and Water 3 x Per Week/30 Days Discharge Instructions: May shower and wash wound with dial antibacterial soap and water prior to dressing change. DIAHANNA, BARBARO ([id number]) [phone].pdf Page 6 of 15 Cleanser: Wound Cleanser 3 x Per Week/30 Days Discharge Instructions: Cleanse the wound with wound cleanser prior to applying a clean dressing using gauze sponges, not tissue or cotton balls. Peri-Wound Care: Sween Lotion (Moisturizing lotion) 3 x Per Week/30 Days Discharge Instructions: Apply moisturizing lotion as directed Prim Dressing: Maxorb Extra Ag+ Alginate Dressing, 2x2 (in/in) 3 x Per Week/30 Days ary Discharge Instructions: Apply to wound bed as instructed Secondary Dressing: ABD Pad, 8x10 3 x Per Week/30 Days Discharge Instructions: Apply over primary dressing as directed. Secured With: Coban  Self-Adherent Wrap 4x5 (in/yd) 3 x Per Week/30 Days Discharge Instructions: Secure with Coban as directed. Secured With: [employer], 4.5x3.1 (in/yd) 3 x Per Week/30 Days Discharge Instructions: Secure with Kerlix as directed. Secured With: 69M Medipore H Soft Cloth Surgical T ape, 4 x 10 (in/yd) 3 x Per Week/30 Days Discharge Instructions: Secure with tape as directed. Wound #30 - Lower Leg Wound Laterality: Right, Posterior, Distal Cleanser: Soap and Water 3 x Per Week/30 Days Discharge Instructions: May shower and wash wound with dial antibacterial soap and water prior to dressing change. Cleanser: Wound Cleanser 3 x Per Week/30 Days Discharge Instructions:  Cleanse the wound with wound cleanser prior to applying a clean dressing using gauze sponges, not tissue or cotton balls. Peri-Wound Care: Sween Lotion (Moisturizing lotion) 3 x Per Week/30 Days Discharge Instructions: Apply moisturizing lotion as directed Prim Dressing: Maxorb Extra Ag+ Alginate Dressing, 2x2 (in/in) 3 x Per Week/30 Days ary Discharge Instructions: Apply to wound bed as instructed Secondary Dressing: ABD Pad, 8x10 3 x Per Week/30 Days Discharge Instructions: Apply over primary dressing as directed. Secured With: Coban Self-Adherent Wrap 4x5 (in/yd) 3 x Per Week/30 Days Discharge Instructions: Secure with Coban as directed. Secured With: [employer], 4.5x3.1 (in/yd) 3 x Per Week/30 Days Discharge Instructions: Secure with Kerlix as directed. Secured With: 40M Medipore H Soft Cloth Surgical T ape, 4 x 10 (in/yd) 3 x Per Week/30 Days Discharge Instructions: Secure with tape as directed. Wound #31 - Lower Leg Wound Laterality: Right, Posterior, Proximal Cleanser: Soap and Water 3 x Per Week/30 Days Discharge Instructions: May shower and wash wound with dial antibacterial soap and water prior to dressing change. Cleanser: Wound Cleanser 3 x Per Week/30 Days Discharge Instructions: Cleanse the wound with  wound cleanser prior to applying a clean dressing using gauze sponges, not tissue or cotton balls. Peri-Wound Care: Sween Lotion (Moisturizing lotion) 3 x Per Week/30 Days Discharge Instructions: Apply moisturizing lotion as directed Prim Dressing: Maxorb Extra Ag+ Alginate Dressing, 2x2 (in/in) 3 x Per Week/30 Days ary Discharge Instructions: Apply to wound bed as instructed Secondary Dressing: ABD Pad, 8x10 3 x Per Week/30 Days Discharge Instructions: Apply over primary dressing as directed. Secured With: Coban Self-Adherent Wrap 4x5 (in/yd) 3 x Per Week/30 Days Discharge Instructions: Secure with Coban as directed. Secured With: [employer], 4.5x3.1 (in/yd) 3 x Per Week/30 Days Discharge Instructions: Secure with Kerlix as directed. Secured With: 40M Medipore H Soft Cloth Surgical T ape, 4 x 10 (in/yd) 3 x Per Week/30 Days Discharge Instructions: Secure with tape as directed. Wound #32 - Lower Leg Wound Laterality: Left, Lateral Cleanser: Soap and Water 3 x Per Week/30 Days Discharge Instructions: May shower and wash wound with dial antibacterial soap and water prior to dressing change. Cleanser: Wound Cleanser 3 x Per Week/30 Days [name], [name] ([id number]) [phone].pdf Page 7 of 15 Discharge Instructions: Cleanse the wound with wound cleanser prior to applying a clean dressing using gauze sponges, not tissue or cotton balls. Peri-Wound Care: Sween Lotion (Moisturizing lotion) 3 x Per Week/30 Days Discharge Instructions: Apply moisturizing lotion as directed Prim Dressing: Maxorb Extra Ag+ Alginate Dressing, 2x2 (in/in) 3 x Per Week/30 Days ary Discharge Instructions: Apply to wound bed as instructed Secondary Dressing: ABD Pad, 8x10 3 x Per Week/30 Days Discharge Instructions: Apply over primary dressing as directed. Secured With: Coban Self-Adherent Wrap 4x5 (in/yd) 3 x Per Week/30 Days Discharge Instructions: Secure with Coban as directed. Secured  With: [employer], 4.5x3.1 (in/yd) 3 x Per Week/30 Days Discharge Instructions: Secure with Kerlix as directed. Secured With: 40M Medipore H Soft Cloth Surgical T ape, 4 x 10 (in/yd) 3 x Per Week/30 Days Discharge Instructions: Secure with tape as directed. Wound #33 - Lower Leg Wound Laterality: Left, Posterior Cleanser: Soap and Water 3 x Per Week/30 Days Discharge Instructions: May shower and wash wound with dial antibacterial soap and water prior to dressing change. Cleanser: Wound Cleanser 3 x Per Week/30 Days Discharge Instructions: Cleanse the wound with wound cleanser prior to applying a clean dressing using gauze sponges, not tissue or cotton balls. Peri-Wound Care: Donnal Moat (  Moisturizing lotion) 3 x Per Week/30 Days Discharge Instructions: Apply moisturizing lotion as directed Prim Dressing: Maxorb Extra Ag+ Alginate Dressing, 2x2 (in/in) 3 x Per Week/30 Days ary Discharge Instructions: Apply to wound bed as instructed Secondary Dressing: ABD Pad, 8x10 3 x Per Week/30 Days Discharge Instructions: Apply over primary dressing as directed. Secured With: Coban Self-Adherent Wrap 4x5 (in/yd) 3 x Per Week/30 Days Discharge Instructions: Secure with Coban as directed. Secured With: [employer], 4.5x3.1 (in/yd) 3 x Per Week/30 Days Discharge Instructions: Secure with Kerlix as directed. Secured With: 59M Medipore H Soft Cloth Surgical T ape, 4 x 10 (in/yd) 3 x Per Week/30 Days Discharge Instructions: Secure with tape as directed. Patient Medications llergies: gabapentin, Lyrica A Notifications Medication Indication Start End 10/04/2023 lidocaine DOSE topical 4 % cream - cream topical once daily Electronic Signature(s) Signed: 10/04/2023 4:37:54 PM By: Baltazar Najjar MD Signed: 10/04/2023 5:07:51 PM By: Redmond Pulling RN, BSN Entered By: Redmond Pulling on 10/04/2023 15:16:00 -------------------------------------------------------------------------------- Problem  List Details Patient Name: Date of Service: [name], [name] 10/04/2023 2:00 PM Medical Record Number: [id number] Patient Account Number: [id number] Date of Birth/Sex: Treating RN: 08/16/1961 (61 y.o. F) Primary Care Provider: [name] Other Clinician: Referring Provider: Treating Provider/Extender: Jacie, Vanwert ([id number]) [id number] Page 8 of 15 Weeks in Treatment: 24 Active Problems ICD-10 Encounter Code Description Active Date MDM Diagnosis 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 2 diabetes mellitus with foot ulcer 04/19/2023 No Yes L97.512 Non-pressure chronic ulcer of other part of right foot with fat layer exposed 04/19/2023 No Yes L97.522 Non-pressure chronic ulcer of other part of left foot with fat layer exposed 04/19/2023 No Yes E11.622 Type 2 diabetes mellitus with other skin ulcer 04/19/2023 No Yes L97.812 Non-pressure chronic ulcer of other part of right lower leg with fat layer 04/19/2023 No Yes exposed I73.89 Other specified peripheral vascular diseases 04/19/2023 No Yes I87.331 Chronic venous hypertension (idiopathic) with ulcer and inflammation of right 04/19/2023 No Yes lower extremity L97.828 Non-pressure chronic ulcer of other part of left lower leg with other specified 10/04/2023 No Yes severity Inactive Problems Resolved Problems Electronic Signature(s) Signed: 10/04/2023 4:37:54 PM By: Baltazar Najjar MD Entered By: Baltazar Najjar on 10/04/2023 15:23:57 -------------------------------------------------------------------------------- Progress Note Details Patient Name: Date of Service: [name], [name] 10/04/2023 2:00 PM Medical Record Number: [id number] Patient Account Number: [id number] Date of Birth/Sex: Treating RN: 15-Jan-1961 (61 y.o. F) Primary Care Provider: [name] Other Clinician: Referring Provider: Treating Provider/Extender: Josephine Cables Weeks in Treatment: 24 Subjective History  of Present Illness (HPI) This 62 year old patient who has a very long significant history of diabetes mellitus, previous alcohol and nicotine abuse, chronic data disease, COPD, diabetes mellitus, height hypertension, critical lower limb ischemia with several wounds being managed at the wound Center at [facility] for over a year. She was recently in the ER at [facility] and was referred to our center. He has had a long history of critical limb ischemia and over a period of time has had balloon angioplasties in March 2016, endarterectomy of the left carotid, UYEN, SHELLY ([id number]) [id number] Page 9 of 15 lower extremity angiogram and treatment by Dr. Nanetta Batty, several cardiac catheterizations. Most recently she had an x-ray of her left foot while in the ER which showed no acute abnormality. during this ER visit she was started on ciprofloxacin and asked to continue with the wound care physicians at [facility]. Her last ABI done in June 2017 showed the  right side to be 0.28 in the left side to be 0.48. Her right toe brachial index was 0.17 on the right and 0.27 on the left. her last hemoglobin A1c was 11.1% She continues to smoke about a pack of cigarettes a day. 03/28/2017 -- -- right foot x-ray -- IMPRESSION:Areas of soft tissue swelling. Mild subluxation second PIP joint. No frank dislocation or fracture. No erosive change or bony destruction. No soft tissue ulceration or radiopaque foreign body evident by radiography. There is plantar fascia calcification with a nearby inferior calcaneal spur. There are foci of arterial vascular calcification. 04/04/2017 -- the patient continues to be noncompliant and continues to complain of a lot of pain and has not done anything about quitting smoking Readmission: 06/26/18 on evaluation today patient presents for reevaluation she has not been seen in our office since May 2018. Since she was last seen here in our  office she has undergone testing at Dr. Durenda Age office where it was revealed that she had an ABI of 0.68 with her previous ABI being 0.64 and a left ABI of 0.38 with previous ABI being 0.34 this study which was performed on 04/05/18 was pretty much equivalent to the study performed on 11/22/17. The findings in the end revealed on the right would appear to be moderate right lower extremity arterial disease on the left Dr. Allyson Sabal states moderate in the report but unfortunately this appears to be much more severe at 0.38 compared to the right. Subsequently the patient was scheduled to have angiography with Dr. Allyson Sabal on 04/12/18. This was however canceled due to the fact that the patient was found to have chronic kidney disease stage III and it was to the point that he did not feel that it will be safe to pursue angiography at that point. She has not been on any antibiotics recently. At this point Dr. Allyson Sabal has not rescheduled anything as far as the injured [place] according to the patient he is somewhat reluctant to do so. Nonetheless with her diminished blood flow this is gonna make it somewhat difficult for her to heal. 07/05/18; this is a patient I have not seen previously. She has very significant PAD as noted above. She apparently has had revascularization efforts by Dr. Allyson Sabal on the right on 3 occasions to the patient. She was supposed to have an attempted angiography on the left however this was canceled apparently because of stage III chronic renal failure. I will need to research all of this. She complains of significant pain in the wound and has claudication enough that when she walks to the end of the driveway she has to stop. She is been using silver alginate to the wounds on her legs and Iodoflex to the area on her left second toe. 07/13/18 on evaluation today patient's wounds actually appear to be doing about the same. She has an appointment she tells me within the next month that  is September 2019 with Dr. Allyson Sabal to discuss options to see if there's anything else that you can recommend or do for her. Nonetheless obviously what we're trying to do is do what we can to save her leg and in turn prevent any additional worsening or damage. None in the meantime we been mainly trying to manage her ulcers as best we can. 07/27/18; some improvement in the multiplicity of wounds on her left lower extremity and foot. She's been using silver alginate. I was unable to determine that she actually has an appointment with Dr. Allyson Sabal.  We are checking into this The patient's wound includes Left lateral foot, left plantar heel, her left anterior calf wound looks close to me, left fourth toe is very close to closed and the left medial calf is perhaps the largest open area READMISSION 02/19/2021 This is a now 62 year old woman with type 2 diabetes and continued cigarette smoking. She has known PAD. She was last in this clinic in 2019 at that point with wounds on her left foot. She left in a nonhealed state. On 06/28/2019 I see she had a left femoropopliteal by vein and vascular with an amputation of the fifth ray. These managed to heal over. Her last arterial studies were in February 2021. This showed an absent waveform at the posterior tibial artery on the right a dampened monophasic dorsalis pedis on the right of 0.44. On the left again the PTA TA was absent her dorsalis pedis was 0.99 triphasic and her great toe pressure was 0.65. This would have been after her revascularization. Once again she tells me that Dr. Allyson Sabal has done revascularization on the right leg on 3 different occasions. She has a right transmetatarsal amputation apparently done by Dr. Lajoyce Corners remotely but I do not see a note for these right lower extremity revascularization but I may not of looked back far enough. In any case she says that the she has a wound on the plantar aspect of the right transmetatarsal amputation site there is  been there for several months. More recently she fell and had a wound on her right medial lower leg she had 5 sutures placed in the ER and then subsequently she has developed an area on the medial lower leg which was a blister that opened up. Past medical history includes type 2 diabetes, PAD, chronic renal failure, congestive heart failure, right transmetatarsal amputation, left fifth ray amputation, continued tobacco abuse, atrial fibrillation and cirrhosis. We did not attempt an ABI on the right leg today because of pain 02/26/2021; patient we admitted to the clinic last week. She has what looks to be 2 areas on her right medial and right anterior lower leg that look more like venous wounds but she has 1 on the first met head at the base of her right transmet. She has known severe PAD. She complains of a lot of pain although some of this may be neuropathic. Is difficult to exclude a component of claudication. Use silver alginate on the wounds on her legs and Iodoflex on the area on the first met head. She has an appointment with Dr. Allyson Sabal on 5/25 although I will text him and discussed the situation. She is have poorly controlled diabetic. She has has stage IIIb chronic renal failure 4/22; patient presents for 1 week follow-up. The 2 areas on her right medial and right anterior lower leg appear well-healing. She has been using silver alginate to this area without issues. She has a first met head ulcer and it is unclear how long this has been there as it was discovered in the ED earlier this month when she was being evaluated for another issue. Iodoflex has been used at this area. 4/29; patient presents for 1 week follow-up. She has been using silver alginate to the leg wounds and Iodoflex to the plantar foot wound. She has had this wrapped with Coban and Kerlix. She has no concerns or complaints today. 5/6; this is a difficult wound on the right plantar foot transmit site. We are not making a lot  of progress. She  had 2 more venous looking wounds on the right medial and right anterior lower leg 1 of which is healed. Her appointment with Dr. Allyson Sabal is on 5/25 5/16; she did not tolerate the offloading shoe we gave her in fact she had a fall with an abrasion on her right forearm she is now back in the modified small shoe which does not offload her foot properly. Her appoint with Dr. Allyson Sabal is still on 5/25 we have been using Iodoflex. The area on the right anterior lower leg is healed 7/18; patient presents for follow-up however has not been seen in 2 months. She was last seen at the end of May. She had a fall at the end of June and was hospitalized. She has been unable to follow-up since then. For the wound she has only been keeping it covered with gauze. She is scheduled for an aortogram with lower extremity runoff at the end of July. 7/29; patient presents for 2-week follow-up. She has home health and they have been changing her dressings. She denies any issues and has no complaints today. She states she had a procedure where they opened up one of her vessels in her legs. She denies signs of infection. 8/5; patient presents for follow-up. She was recently in the hospital for bradycardia. She was noted to have cellulitis to the left lower extremity and Unna boot was placed due to blisters and increased swelling. She reports improvement to her left leg since being in the hospital and stability check her right plantar foot wound. She denies signs of infection. 8/23; since I last saw this patient a lot has happened. She still has the area over the right first met head in the setting of previous transmetatarsal amputation. Firstly most importantly she underwent revascularization of her occluded right SFA. She underwent directional atherectomy followed by drug-coated balloon angioplasty. She has no named vessel below the knee hopefully the revascularization of the right SFA will improve collateral  flow. It is not felt however that she has any endovascular options. ALIYANAH, CONEY ([id number]) [phone].pdf Page 10 of 15 She had follow-up arterial studies noninvasive on 8/9. These showed an ABI of 0.44 at the right PTA. Monophasic waveforms. On the left her great toe ABI was 0.68. Her follow-up arterial Doppler showed a 50 to 74% stenosis in the proximal SFA and a 50 to 74% stenosis in the distal SFA mild to severe atherosclerosis noted throughout the extremity. Areas of shadowing plaque seen, unable to rule out higher grade stenosis she also had a fall apparently wearing a right foot forefoot off loader that we gave her. She therefore comes in in an ordinary running shoe today. Not certain if this is the fall that ended up in the hospital with bradycardia 9/6 area over the right first metatarsal head in the setting of her previous amputation and severe PAD. She is also a diabetic with known PAD status post attempt at revascularization. She is continued smoker Again the separation of visits in the clinic is somewhat disturbing if we are going to consider her for a total contact cast. She is not wearing anything to offload this stating it causes imbalance especially the forefoot off loader. She basically comes in in bedroom slippers She also had a fall this morning about an hour ago. She has a skin tear on the left dorsal forearm 9/13; areas over the right first metatarsal head in the setting of previous TMA and severe PAD. Again she comes in here in slippers.  We have been using Hydrofera Blue. We use MolecuLight on this which was essentially negative study 9/20; right first metatarsal head in the setting of her previous TMA and severe PAD. Same slipper type shoes. She says she cannot wear a forefoot off loader and for some reason she will not wear surgical shoes. She says she is smoking half a pack per day. 9/30; right first metatarsal head. Absolutely no improvement in  fact there is tunneling superiorly very close to bone. She does not offload this properly at all. We use MolecuLight on this 2 weeks ago that did not show any surface bacteria we have been using silver collagen is a dressing She tells me she is down to 3 cigarettes a day I have offered encouragement and a treatment plan 10/6; right first metatarsal head. Exposed bone this week which is not surprising. We have been using silver collagen. She is smoking 5 to 10 cigarettes a day 10/17; she has not been here in 10 days. The bone scraping that I did on 10/6 showed staph lugdunensis, staph epidermidis and Pseudomonas Alcalifenes. I put in for Septra DS 1 p.o. twice daily for 14 days last week would be but we could not reach her to start the antibiotic. Quinolones would have been a good alternative except she has multiple drug interactions. Septra would not cover what ever the Pseudomonas is but I am not really sure of the relevance here. I am going to try her on the Septra for 2 weeks. She has a probing wound on the right TMA that probes to bone. She claims to have just about stop smoking I reviewed her arterial status. She underwent a left fem-tib bypass by Dr. Arbie Cookey in 06/28/2019. Her last angiogram was in July of this year by Dr. Allyson Sabal. This showed an occluded right SFA the popliteal and tibial vessels were also occluded the peroneal vessel filled by collaterals and filled the PT and DP by communicating collaterals. She was not felt to have any additional and vascular options. Dr. Allyson Sabal noted that she he had previously revascularized her right SFA 3 times. 10/25; she is tolerating the Septra but says it makes her nauseated. I am going to continue this for an additional 2 weeks perhaps for a total of 6 weeks. Her wound does not look too much different. There is on the right first met metatarsal head. There is probing areas around the tissue that is in the middle of the wound. There is no purulence I cannot  feel bone. The original source was for a bone scraping. 11/8; right first metatarsal head. This does not probe to bone and there is no purulence. As far as I can tell she is completed the Septra although there were problems with exact length of time and I think compliance. In any case I gave her 6 weeks. I have reviewed her PAD above. 11/15; right first metatarsal head. Again protruding subcutaneous tissue but this does not have any adherence to surrounding skin. Undermining circumferentially. There is no palpable bone. Not grossly purulent. We have been using Iodoflex to try to get some adherence to clean off the surface but no real improvement. I do not think they actually had enough supplies listening to the patient. She completed the 6 weeks of Septra I gave her, but I am not sure about the compliance with the dates i.e. may have missed days taken 1 a day etc. The patient has a vascular follow-up with Dr. Allyson Sabal this coming Friday i.e.  in 3 days. By my read of arterial evaluations I do not think she is felt to be a candidate for any further revascularization. The last angiogram was in July. 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 She continues to smoke now up to 10 cigarettes a day. She is not in any pain. Wound has not improved but is certainly not worsened. I gave her 6 weeks of trimethoprim/sulfamethoxazole which she is completed in some fashion. 11/29 right first metatarsal head previous TMA. Wound actually looks somewhat better today still a lot of callus around the wound on debris on the wound surface. UNFORTUNATELY the patient missed her appointment with Dr. Allyson Sabal and is not rebooked apparently until sometime in February 12/13; right first metatarsal wound actually looks some smaller today. I did not debride this today. She is using Iodoflex.  When she came in last week she had a large abscess on her left anterior lower leg apparently after falling we send her to the ER  to have this drained which was done. I cannot see any cultures x-ray was negative. She was apparently given 2 doses of IV antibiotics and discharged on Bactrim DS twice daily which she is still taking. As mentioned I cannot see any cultures. 12/29; culture of the abscess on the left leg I did last time showed Serratia. We gave her antibiotics. I note she was in the ER next day with bleeding which they controlled she is on anticoagulants.She comes in today to clinic and the area on her right plantar foot is miraculously very hyperkeratotic but healed [name] is a 62 year old female with a past medical history of uncontrolled type 2 diabetes with TMA to the right foot and fifth ray amputation to left foot. She reports a callus to the right first met head and on the lateral aspect of the left foot. She saw podiatry 3 days ago and had the callus debrided. There are no open wounds at this time. She follows up with podiatry in 2 weeks. Readmission: 04-19-2023 upon evaluation today patient appears to be doing well all things considered in regard to her wounds. She does have several wounds however on her feet 1 on the left foot centrally along the lateral edge and then a wound on the right lower leg more posterior. On the right foot plantar distal at the transmetatarsal amputation site this is the worst. With that being said she tells me this has been going on for quite some time she has been seen by podiatry they have done some debridements in the past. With that being said based on what we are seeing I believe that the patient's primary issue on the right side is that she is having some issues here with her arterial flow. The ABI on the right is 0.57 on the left is 1.04. With that being said this reading on the right is getting necessitated further evaluation by vascular. She tells me she has been seen by Dr. Gery Pray in the past and he stated there was nothing further to be done for the  right leg. With that being said I think that it would be good to have a second opinion with vascular on this. Patient does have a history of diabetes mellitus type 2, peripheral vascular disease, and hypertension. The previous ABIs were performed on January 11, 2023. 04-26-2023 upon evaluation today patient appears to be doing about the same in regard to her wounds. She still has not had the arterial  study at this point. I think this is something needs to be done as quickly as possible. Fortunately I do not see any evidence of active infection locally or systemically which is great news. No fevers, chills, nausea, vomiting, or diarrhea. 7/24; the patient has not been here in about a month and a half. She has open wounds on the plantar right foot second metatarsal head in the setting of a previous TMA, she has a new area on the left lateral foot and perhaps traumatic areas on the right anterior and left lateral lower legs. She has poorly controlled edema. The patient has known severe PAD has had multiple interventions in the right SFA. 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 She had an pressure of 60 at the dorsalis pedis giving her an ABI of 0.57 monophasic waveforms here. On the left her study was a lot better with an ABI 1.04 triphasic waveforms. The ABI might be falsely elevated on the left. I think at her last angiogram she had occluded right SFA popliteal and tibial vessels with the peroneal vessel being the only vessel feeding her foot via collaterals. [name], TAIBI ([id number]) [phone].pdf Page 11 of 15 She comes in this time in basic over-the-counter shoes 06-14-2023 upon evaluation today patient appears to be doing well currently in regard to her wounds all things considered except for the left lateral foot where she has what appears to be a blister this  is erythematous as well and concerned about infection. I discussed with her today that I do think we need to see about doing the Vi core culture and subsequently depending on the results of this we will initiate antibiotic treatment as necessary. With that being said right now I think that she is going to need to be aggressive and elevating her legs she has an appointment with vascular coming up around 3 August is what I believe her appointment date is. Nonetheless once we get clearance from the standpoint of vascular she does have good blood flow and we can subsequently try to see what we do about getting her edema under much better control and then she really needs some much stronger compression than what we currently are able to do. 06-21-2023 upon evaluation today patient appears to be doing pretty well currently in regard to her wounds most of which seem to be showing signs of improvement. She has been require little bit of debridement in regard to the right plantar foot wound I will do this as carefully as possible as we are still working on establishing that she has sufficient blood flow at this point. This right side on previous testing seem to be somewhat questionable. Fortunately I do not see any signs of active infection locally nor systemically at this time. 06-28-2023 patient does have an appointment with vascular on 07-11-2023. With that being said right now she has been having some slow progress but still nonetheless good progress at this point. I am actually very pleased with where we stand. I do not see any signs of active infection locally or systemically which is great news. No fevers, chills, nausea, vomiting, or diarrhea. 07-05-2023 upon evaluation today patient appears to be doing well currently in regard to her wound. She has been tolerating the dressing changes. She only has 1 wound left on the plantar aspect of the right foot. With that being said she  does have a peg assist  offloading shoe she has been using at this time. 8/28; patient with a wound over the plantar right foot metatarsal head. She was seen by Dr. Lenell Antu of vascular surgery. Noted that the right ABI was only 0.57 it was felt that she had progression versus a previous study. She had some stenosis in the SFA a 7599% stenosis in the proximal mid SFA mild stenosis in the distal SFA and mild stenosis in the proximal PTA. On the left she had a patent left femoral to ATA bypass graft with mild stenosis It was recommended that she undergo angiography for limb salvage on the right. That is being arranged as we speak. Her wound is on the right first metatarsal head she has been using collagen as the primary dressing. She tells me she is making every effort to stop smoking 07-19-2023 upon evaluation today patient appears to be doing okay in regard to her foot ulcer although her arterial procedure/revascularization keeps getting pushed back. She states that they are talking about pushing back and left nail due to the fact that she changed insurances and I do not have a being "updated insurance card number". With that being said I explained to the patient that should be something that she should be able to get on line easily by making an account. She is also called [place] [facility] and they said they were sending it but it would be about a week before described by next Wednesday. She really does not have time to be holding on this she needs to be proceeding with intervention as quickly as possible. 07-26-2023 upon evaluation today patient appears to be doing well currently in regard to her wound. This is actually showing signs of looking about the same I did clear a lot of callus up last week this actually looks better to me from an overall visual standpoint than last week though it is a little larger because of the callus I had removed. With that being said I feel like she is actually making really good progress here  towards complete closure. No fevers, chills, nausea, vomiting, or diarrhea. 08-09-2023 upon evaluation today patient presents for follow-up concerning her ongoing issues with her right plantar foot ulcer. She is going to be having arteriogram on Friday that is just just 2 days and hopefully following this it will actually allow for more aggressive healing in regard to the right plantar foot. Today I am going to perform some debridement to clear away some of the necrotic debris. 08-23-2023 upon evaluation patient's wound really appears to be doing about the same. She did have her arteriogram with Dr. Lenell Antu and that was on 9-27- 2024. Unfortunately it was noted that the patient had no good options for further revascularization. They plan to reevaluate there in a month but she is stated to be at "high risk for major amputation". With that being said it does appear they were able to do stenting of the right femoral-popliteal artery but unfortunately they were unable to cross the TP trunk and proximal peroneal occlusion. Subsequently this means that there is really not good blood flow into the ankle and foot which means that the patient really does not have a good chance of getting this to heal. 08-30-2023 upon evaluation today patient appears to be doing well currently in regard to her wound. She has been tolerating the dressing changes without complication and in general I do feel that the patient is doing quite well at this  time. Unfortunately with the blood flow being so low and really no real vascular options to improve blood flow she really has to try to stay off of this is much as possible. 09-06-2023 upon evaluation today patient appears to be doing well currently in regard to the wound on her plantar foot it does not seem to be any worse is not terribly better but again there is really not much we can do from a blood flow standpoint at this time unfortunately. There does not appear to be any signs  of active infection locally or systemically which is good news. She does have a wound on the posterior ankle location and the right leg as well. She also has an area on the left lateral foot. 09-13-2023 upon evaluation today patient appears to be doing decently well all things considered in regard to her wounds. Fortunately I do not see any signs of worsening overall I do believe that the patient is making good headway towards closure which is good news. No fevers, chills, nausea, vomiting, or diarrhea. 11/20; since the patient was last here she was admitted to hospital from 11/9 through 09/27/2023. She had atrial fibrillation with rapid ventricular response she underwent successful cardioversion to normal sinus rhythm. She has noted to have stage III chronic renal failure, heart failure with preserved ejection fraction. She was discharged home apparently with a furosemide infusion pump although I do not see that on her discharge summary. She is listed as having torsemide to 100 mg/day. Unfortunately in any case she is gaining weight quite substantially per description of the patient. She has significant bilateral lower extremity edema but nonpitting. She has no open wounds on the left leg and right leg. She was not discharged with any home health and I am not really clear anybody is dressing these wounds. She has known PAD She has the original wounds on the right first met head and right posterior Achilles area. New wounds on the right anteriorly and right posteriorly and on the left laterally and posteriorly. All her wounds are draining clear edema fluid Objective Constitutional Sitting or standing Blood Pressure is within target range for patient.. Pulse regular and within target range for patient.[name] Respirations regular, non-labored and within target range.. Temperature is normal and within the target range for the patient.[name] Appears in no distress. Vitals Time Taken: 2:42 PM, Height: 64 in,  Weight: 225 lbs, BMI: 38.6, Temperature: 98.6 F, Pulse: 65 bpm, Respiratory Rate: 18 breaths/min, Blood Pressure: 138/80 mmHg. [name], [name] ([id number]) [phone].pdf Page 12 of 15 Cardiovascular Dorsalis pedis pulses are palpable at the dorsalis pedis however she is known significant PAD apparently nonrevascularizable.. Edema present in both extremities.This appears to be mostly nonpitting. General Notes: Wound exam; she has area over her first met head which actually appears fairly clean but I guess is unchanged per description of her nurses. Posterior right Achilles area she has a new wound on the right anterior tibial anteriorly wounds on the left lateral and left posterior calf. All of these are weeping edema fluid. Integumentary (Hair, Skin) Wound #23 status is Open. Original cause of wound was Gradually Appeared. The date acquired was: 04/18/2021. The wound has been in treatment 24 weeks. The wound is located on the Right Metatarsal head first. The wound measures 1.6cm length x 1.4cm width x 0.2cm depth; 1.759cm^2 area and 0.352cm^3 volume. There is Fat Layer (Subcutaneous Tissue) exposed. There is no tunneling or undermining noted. There is a medium amount of serosanguineous drainage noted.  The wound margin is distinct with the outline attached to the wound base. There is large (67-100%) pink granulation within the wound bed. There is a small (1-33%) amount of necrotic tissue within the wound bed including Adherent Slough. The periwound skin appearance had no abnormalities noted for color. The periwound skin appearance exhibited: Callus, Maceration. The periwound skin appearance did not exhibit: Dry/Scaly. Periwound temperature was noted as No Abnormality. Wound #29 status is Open. Original cause of wound was Bump. The date acquired was: 08/20/2023. The wound has been in treatment 6 weeks. The wound is located on the Right,Anterior Lower Leg. The wound measures 1.6cm  length x 1.6cm width x 0.1cm depth; 2.011cm^2 area and 0.201cm^3 volume. There is Fat Layer (Subcutaneous Tissue) exposed. There is no tunneling or undermining noted. There is a medium amount of serosanguineous drainage noted. The wound margin is distinct with the outline attached to the wound base. There is medium (34-66%) red granulation within the wound bed. There is a medium (34-66%) amount of necrotic tissue within the wound bed including Adherent Slough. The periwound skin appearance exhibited: Hemosiderin Staining. The periwound skin appearance did not exhibit: Callus, Crepitus, Excoriation, Induration, Rash, Scarring, Dry/Scaly, Maceration, Atrophie Blanche, Cyanosis, Ecchymosis, Mottled, Pallor, Rubor, Erythema. Wound #30 status is Open. Original cause of wound was Gradually Appeared. The date acquired was: 09/06/2023. The wound has been in treatment 4 weeks. The wound is located on the Right,Distal,Posterior Lower Leg. The wound measures 0.5cm length x 0.5cm width x 0.1cm depth; 0.196cm^2 area and 0.02cm^3 volume. There is Fat Layer (Subcutaneous Tissue) exposed. There is no tunneling or undermining noted. There is a medium amount of serosanguineous drainage noted. The wound margin is distinct with the outline attached to the wound base. There is small (1-33%) red, pink granulation within the wound bed. There is a large (67-100%) amount of necrotic tissue within the wound bed including Adherent Slough. The periwound skin appearance exhibited: Hemosiderin Staining. The periwound skin appearance did not exhibit: Callus, Crepitus, Excoriation, Induration, Rash, Scarring, Dry/Scaly, Maceration, Atrophie Blanche, Cyanosis, Ecchymosis, Mottled, Pallor, Rubor, Erythema. Periwound temperature was noted as No Abnormality. Wound #31 status is Open. Original cause of wound was Blister. The date acquired was: 10/04/2023. The wound is located on the Right,Proximal,Posterior Lower Leg. The wound measures  4cm length x 1cm width x 0.1cm depth; 3.142cm^2 area and 0.314cm^3 volume. There is no tunneling or undermining noted. There is a medium amount of serosanguineous drainage noted. There is large (67-100%) red granulation within the wound bed. There is no necrotic tissue within the wound bed. Wound #32 status is Open. Original cause of wound was Blister. The date acquired was: 10/04/2023. The wound is located on the Left,Lateral Lower Leg. The wound measures 1.7cm length x 2cm width x 0.1cm depth; 2.67cm^2 area and 0.267cm^3 volume. There is no tunneling or undermining noted. There is a medium amount of serosanguineous drainage noted. There is small (1-33%) pink granulation within the wound bed. There is a large (67-100%) amount of necrotic tissue within the wound bed including Adherent Slough. Wound #33 status is Open. Original cause of wound was Blister. The date acquired was: 10/04/2023. The wound is located on the Left,Posterior Lower Leg. The wound measures 0.6cm length x 0.8cm width x 0.1cm depth; 0.377cm^2 area and 0.038cm^3 volume. There is no tunneling or undermining noted. There is a medium amount of serosanguineous drainage noted. There is large (67-100%) pink granulation within the wound bed. There is a small (1-33%) amount of necrotic tissue within  the wound bed including Adherent Slough. 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 - Dr Leanord Hawking Return appointment in 3 weeks. - Dr Leanord Hawking Nurse Visit: - tuesday or wednesday Anesthetic: (In clinic) Topical Lidocaine 5% applied to wound bed Bathing/ Shower/ Hygiene: May shower with protection but do not get wound dressing(s) wet. Protect dressing(s) with water repellant cover (for example, large plastic bag) or a cast cover and may then take shower. Edema Control - Orders / Instructions: Elevate legs to the level of the heart or above for 30 minutes daily and/or when sitting for 3-4 times a day throughout the day. Avoid standing for long periods of time. Off-Loading: Open toe surgical shoe to: - with Peg Assist in shoe. Limit walking on foot. Stay off the foot to minimize pressure to bottom of foot. Additional Orders / Instructions: Stop/Decrease Smoking Follow Nutritious Diet [name], [name] ([id number]) [id number] Page 13 of 15 Non Wound Condition: Apply the following to affected area as directed: - left foot- apply betadine to affected area cover with ABD pad, secure with kerlix tubigrip size E. Home Health: Wound #23 Right Metatarsal head first: Admit to Home Health for skilled nursing wound care. May utilize formulary equivalent dressing for wound treatment orders unless otherwise specified. - HH once per week, to change wound dressing and wraps: silver alginate, ABD, kerlix/coban bilateral lower legs Other Home Health Orders/Instructions: - [facility] Wound #29 Right,Anterior Lower Leg: Admit to Home Health for skilled nursing wound care. May utilize formulary equivalent dressing for wound treatment orders unless otherwise specified. - HH once per week, to change wound dressing and wraps: silver alginate, ABD, kerlix/coban bilateral lower legs Other Home Health Orders/Instructions: - [facility] Wound #30 Right,Distal,Posterior Lower Leg: Admit to Home Health for skilled  nursing wound care. May utilize formulary equivalent dressing for wound treatment orders unless otherwise specified. - HH once per week, to change wound dressing and wraps: silver alginate, ABD, kerlix/coban bilateral lower legs Other Home Health Orders/Instructions: - [facility] Wound #31 Right,Proximal,Posterior Lower Leg: Admit to Home Health for skilled nursing wound care. May utilize formulary equivalent dressing for wound treatment orders unless otherwise specified. - HH once per week, to change wound dressing and wraps: silver alginate, ABD, kerlix/coban bilateral lower legs Other Home Health Orders/Instructions: - [facility] Wound #32 Left,Lateral Lower Leg: Admit to Home Health for skilled nursing wound care. May utilize formulary equivalent dressing for wound treatment orders unless otherwise specified. - HH once per week, to change wound dressing and wraps: silver alginate, ABD, kerlix/coban bilateral lower legs Other Home Health Orders/Instructions: - [facility] Wound #33 Left,Posterior Lower Leg: Admit to Home Health for skilled nursing wound care. May utilize formulary equivalent dressing for wound treatment orders unless otherwise specified. - HH once per week, to change wound dressing and wraps: silver alginate, ABD, kerlix/coban bilateral lower legs Other Home Health Orders/Instructions: - [facility] The following medication(s) was prescribed: lidocaine  topical 4 % cream cream topical once daily was prescribed at facility WOUND #23: - Metatarsal head first Wound Laterality: Right Cleanser: Soap and Water 3 x Per Week/30 Days Discharge Instructions: May shower and wash wound with dial antibacterial soap and water prior to dressing change. Cleanser: Wound Cleanser 3 x Per Week/30 Days Discharge Instructions: Cleanse the wound with wound cleanser prior to applying a clean dressing using gauze sponges, not tissue or cotton balls. Peri-Wound Care:  Sween Lotion (Moisturizing lotion) 3 x Per Week/30 Days Discharge Instructions: Apply moisturizing lotion as directed Prim Dressing: Maxorb Extra Ag+ Alginate Dressing, 2x2 (in/in) 3 x Per Week/30 Days ary Discharge Instructions: Apply to wound bed as instructed Secondary Dressing: ABD Pad, 8x10 3 x Per Week/30 Days Discharge Instructions: Apply over primary dressing as directed. Secured With: Coban Self-Adherent Wrap 4x5 (in/yd) 3 x Per Week/30 Days Discharge Instructions: Secure with Coban as directed. Secured With: [employer], 4.5x3.1 (in/yd) 3 x Per Week/30 Days Discharge Instructions: Secure with Kerlix as directed. Secured With: 85M Medipore H Soft Cloth Surgical T ape, 4 x 10 (in/yd) 3 x Per Week/30 Days Discharge Instructions: Secure with tape as directed. WOUND #29: - Lower Leg Wound Laterality: Right, Anterior Cleanser: Soap and Water 3 x Per Week/30 Days Discharge Instructions: May shower and wash wound with dial antibacterial soap and water prior to dressing change. Cleanser: Wound Cleanser 3 x Per Week/30 Days Discharge Instructions: Cleanse the wound with wound cleanser prior to applying a clean dressing using gauze sponges, not tissue or cotton balls. Peri-Wound Care: Sween Lotion (Moisturizing lotion) 3 x Per Week/30 Days Discharge Instructions: Apply moisturizing lotion as directed Prim Dressing: Maxorb Extra Ag+ Alginate Dressing, 2x2 (in/in) 3 x Per Week/30 Days ary Discharge Instructions: Apply to wound bed as instructed Secondary Dressing: ABD Pad, 8x10 3 x Per Week/30 Days Discharge Instructions: Apply over primary dressing as directed. Secured With: Coban Self-Adherent Wrap 4x5 (in/yd) 3 x Per Week/30 Days Discharge Instructions: Secure with Coban as directed. Secured With: [employer], 4.5x3.1 (in/yd) 3 x Per Week/30 Days Discharge Instructions: Secure with Kerlix as directed. Secured With: 85M Medipore H Soft Cloth Surgical T ape, 4 x 10 (in/yd) 3  x Per Week/30 Days Discharge Instructions: Secure with tape as directed. WOUND #30: - Lower Leg Wound Laterality: Right, Posterior, Distal Cleanser: Soap and Water 3 x Per Week/30 Days Discharge Instructions: May shower and wash wound with dial antibacterial soap and water prior to dressing change. Cleanser: Wound Cleanser 3 x Per Week/30 Days Discharge Instructions: Cleanse the wound with wound cleanser prior to applying a clean dressing using gauze sponges, not tissue or cotton balls. Peri-Wound Care: Sween Lotion (Moisturizing lotion) 3 x Per Week/30 Days Discharge Instructions: Apply moisturizing lotion as directed Prim Dressing: Maxorb Extra Ag+ Alginate Dressing, 2x2 (in/in) 3 x Per Week/30 Days ary Discharge Instructions: Apply to wound bed as instructed Secondary Dressing: ABD Pad, 8x10 3 x Per Week/30 Days Discharge Instructions: Apply over primary dressing as directed. Secured With: Coban Self-Adherent Wrap 4x5 (in/yd) 3 x Per Week/30 Days Discharge Instructions: Secure with Coban as directed. Secured With: [employer], 4.5x3.1 (in/yd) 3 x Per Week/30 Days Discharge Instructions: Secure with Kerlix as directed. Secured With: 85M Medipore H Soft Cloth Surgical T ape, 4 x 10 (in/yd) 3 x Per Week/30 Days Discharge Instructions: Secure with tape as directed. WOUND #31: - Lower Leg Wound Laterality: Right, Posterior, Proximal Cleanser: Soap and Water 3 x Per Week/30 Days Discharge Instructions:  May shower and wash wound with dial antibacterial soap and water prior to dressing change. Cleanser: Wound Cleanser 3 x Per Week/30 Days Discharge Instructions: Cleanse the wound with wound cleanser prior to applying a clean dressing using gauze sponges, not tissue or cotton balls. Peri-Wound Care: Sween Lotion (Moisturizing lotion) 3 x Per Week/30 Days Discharge Instructions: Apply moisturizing lotion as directed Prim Dressing: Maxorb Extra Ag+ Alginate Dressing, 2x2 (in/in) 3 x Per  Week/30 Days ary Discharge Instructions: Apply to wound bed as instructed Secondary Dressing: ABD Pad, 8x10 3 x Per Week/30 Days Discharge Instructions: Apply over primary dressing as directed. Secured With: Coban Self-Adherent Wrap 4x5 (in/yd) 3 x Per Week/30 Days STARLIN, PANIK ([id number]) [phone].pdf Page 14 of 15 Discharge Instructions: Secure with Coban as directed. Secured With: [employer], 4.5x3.1 (in/yd) 3 x Per Week/30 Days Discharge Instructions: Secure with Kerlix as directed. Secured With: 15M Medipore H Soft Cloth Surgical T ape, 4 x 10 (in/yd) 3 x Per Week/30 Days Discharge Instructions: Secure with tape as directed. WOUND #32: - Lower Leg Wound Laterality: Left, Lateral Cleanser: Soap and Water 3 x Per Week/30 Days Discharge Instructions: May shower and wash wound with dial antibacterial soap and water prior to dressing change. Cleanser: Wound Cleanser 3 x Per Week/30 Days Discharge Instructions: Cleanse the wound with wound cleanser prior to applying a clean dressing using gauze sponges, not tissue or cotton balls. Peri-Wound Care: Sween Lotion (Moisturizing lotion) 3 x Per Week/30 Days Discharge Instructions: Apply moisturizing lotion as directed Prim Dressing: Maxorb Extra Ag+ Alginate Dressing, 2x2 (in/in) 3 x Per Week/30 Days ary Discharge Instructions: Apply to wound bed as instructed Secondary Dressing: ABD Pad, 8x10 3 x Per Week/30 Days Discharge Instructions: Apply over primary dressing as directed. Secured With: Coban Self-Adherent Wrap 4x5 (in/yd) 3 x Per Week/30 Days Discharge Instructions: Secure with Coban as directed. Secured With: [employer], 4.5x3.1 (in/yd) 3 x Per Week/30 Days Discharge Instructions: Secure with Kerlix as directed. Secured With: 15M Medipore H Soft Cloth Surgical T ape, 4 x 10 (in/yd) 3 x Per Week/30 Days Discharge Instructions: Secure with tape as directed. WOUND #33: - Lower Leg Wound  Laterality: Left, Posterior Cleanser: Soap and Water 3 x Per Week/30 Days Discharge Instructions: May shower and wash wound with dial antibacterial soap and water prior to dressing change. Cleanser: Wound Cleanser 3 x Per Week/30 Days Discharge Instructions: Cleanse the wound with wound cleanser prior to applying a clean dressing using gauze sponges, not tissue or cotton balls. Peri-Wound Care: Sween Lotion (Moisturizing lotion) 3 x Per Week/30 Days Discharge Instructions: Apply moisturizing lotion as directed Prim Dressing: Maxorb Extra Ag+ Alginate Dressing, 2x2 (in/in) 3 x Per Week/30 Days ary Discharge Instructions: Apply to wound bed as instructed Secondary Dressing: ABD Pad, 8x10 3 x Per Week/30 Days Discharge Instructions: Apply over primary dressing as directed. Secured With: Coban Self-Adherent Wrap 4x5 (in/yd) 3 x Per Week/30 Days Discharge Instructions: Secure with Coban as directed. Secured With: [employer], 4.5x3.1 (in/yd) 3 x Per Week/30 Days Discharge Instructions: Secure with Kerlix as directed. Secured With: 15M Medipore H Soft Cloth Surgical T ape, 4 x 10 (in/yd) 3 x Per Week/30 Days Discharge Instructions: Secure with tape as directed. 1. #1 we are using silver alginate to all wounds. She was on Iodoflex. There is too much weeping edema fluid in her legs and for purposes of ease really also use this on her right first met head wound. 2. Kerlix Coban I think  would be the maximal amount of compression we can stand here. 3. Will attempt home health. Somewhat amazed this was not arranged from the hospital but the patient says she has not had anybody from home health 4. I do not think any of these wounds will heal as long as we have the amount of weeping edema fluid. 5. I have asked the patient to get in touch with her cardiologist she does not even seem to be aware of who that is Electronic Signature(s) Signed: 10/04/2023 4:37:54 PM By: Baltazar Najjar MD Entered By:  Baltazar Najjar on 10/04/2023 15:36:29 -------------------------------------------------------------------------------- SuperBill Details Patient Name: Date of Service: [name], [name] 10/04/2023 Medical Record Number: [id number] Patient Account Number: [id number] Date of Birth/Sex: Treating RN: [id number] (61 y.o. F) Primary Care Provider: [name] Other Clinician: Referring Provider: Treating Provider/Extender: Luster Landsberg, Odette Horns Weeks in Treatment: 24 Diagnosis Coding ICD-10 Codes Code Description 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 L97.522 Non-pressure chronic ulcer of other part of left foot with fat layer exposed E11.622 Type 2 diabetes mellitus with other skin ulcer L97.812 Non-pressure chronic ulcer of other part of right lower leg with fat layer exposed [name] ([id number]) [phone].pdf Page 15 of 15 I73.89 Other specified peripheral vascular diseases I87.331 Chronic venous hypertension (idiopathic) with ulcer and inflammation of right lower extremity L97.828 Non-pressure chronic ulcer of other part of left lower leg with other specified severity Facility Procedures : CPT4 Code: [id number] Description: 44034 - WOUND CARE VISIT-LEV 5 EST PT Modifier: Quantity: 1 Physician Procedures : CPT4 Code Description Modifier [id number] [phone] - WC PHYS LEVEL 2 - EST PT ICD-10 Diagnosis Description 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 L97.522  Non-pressure chronic ulcer of other part of left foot with fat layer exposed L97.812 Non-pressure chronic ulcer of other part of right lower leg with fat layer exposed Quantity: 1 Electronic Signature(s) Signed: 10/09/2023 2:08:17 PM By: Pearletha Alfred Signed: 10/09/2023 3:45:26 PM By: Baltazar Najjar  MD Previous Signature: 10/04/2023 4:37:54 PM Version By: Baltazar Najjar MD Entered By: Pearletha Alfred on 10/09/2023 14:08:16

## 2023-10-07 ENCOUNTER — Emergency Department (HOSPITAL_COMMUNITY): Payer: 59

## 2023-10-07 ENCOUNTER — Other Ambulatory Visit: Payer: Self-pay

## 2023-10-07 ENCOUNTER — Encounter (HOSPITAL_COMMUNITY): Payer: Self-pay | Admitting: Emergency Medicine

## 2023-10-07 ENCOUNTER — Emergency Department (HOSPITAL_COMMUNITY)
Admission: EM | Admit: 2023-10-07 | Discharge: 2023-10-07 | Disposition: A | Discharge status: Home / Self Care | Payer: 59 | Attending: Emergency Medicine | Admitting: Emergency Medicine

## 2023-10-07 DIAGNOSIS — R739 Hyperglycemia, unspecified: Secondary | ICD-10-CM

## 2023-10-07 DIAGNOSIS — M7989 Other specified soft tissue disorders: Secondary | ICD-10-CM | POA: Diagnosis not present

## 2023-10-07 DIAGNOSIS — Z794 Long term (current) use of insulin: Secondary | ICD-10-CM | POA: Diagnosis not present

## 2023-10-07 DIAGNOSIS — Z7901 Long term (current) use of anticoagulants: Secondary | ICD-10-CM | POA: Insufficient documentation

## 2023-10-07 DIAGNOSIS — R252 Cramp and spasm: Secondary | ICD-10-CM | POA: Diagnosis present

## 2023-10-07 DIAGNOSIS — I509 Heart failure, unspecified: Secondary | ICD-10-CM | POA: Diagnosis not present

## 2023-10-07 DIAGNOSIS — E119 Type 2 diabetes mellitus without complications: Secondary | ICD-10-CM | POA: Insufficient documentation

## 2023-10-07 LAB — COMPREHENSIVE METABOLIC PANEL
ALT: 24 U/L (ref 0–44)
AST: 12 U/L — ABNORMAL LOW (ref 15–41)
Albumin: 2.7 g/dL — ABNORMAL LOW (ref 3.5–5.0)
Alkaline Phosphatase: 271 U/L — ABNORMAL HIGH (ref 38–126)
Anion gap: 8 (ref 5–15)
BUN: 44 mg/dL — ABNORMAL HIGH (ref 6–20)
CO2: 23 mmol/L (ref 22–32)
Calcium: 8.5 mg/dL — ABNORMAL LOW (ref 8.9–10.3)
Chloride: 107 mmol/L (ref 98–111)
Creatinine, Ser: 1.29 mg/dL — ABNORMAL HIGH (ref 0.44–1.00)
GFR, Estimated: 48 mL/min — ABNORMAL LOW (ref >60)
Glucose, Bld: 221 mg/dL — ABNORMAL HIGH (ref 70–99)
Potassium: 4 mmol/L (ref 3.5–5.1)
Sodium: 138 mmol/L (ref 135–145)
Total Bilirubin: 0.3 mg/dL (ref <1.2)
Total Protein: 6.9 g/dL (ref 6.5–8.1)

## 2023-10-07 LAB — URINALYSIS, ROUTINE W REFLEX MICROSCOPIC
Bilirubin Urine: NEGATIVE
Glucose, UA: 50 mg/dL — AB
Hgb urine dipstick: NEGATIVE
Ketones, ur: NEGATIVE mg/dL
Leukocytes,Ua: NEGATIVE
Nitrite: NEGATIVE
Protein, ur: 30 mg/dL — AB
Specific Gravity, Urine: 1.009 (ref 1.005–1.030)
pH: 5 (ref 5.0–8.0)

## 2023-10-07 LAB — CBC WITH DIFFERENTIAL/PLATELET
Abs Immature Granulocytes: 0.03 10*3/uL (ref 0.00–0.07)
Basophils Absolute: 0 10*3/uL (ref 0.0–0.1)
Basophils Relative: 0 %
Eosinophils Absolute: 0.3 10*3/uL (ref 0.0–0.5)
Eosinophils Relative: 3 %
HCT: 26.7 % — ABNORMAL LOW (ref 36.0–46.0)
Hemoglobin: 7.9 g/dL — ABNORMAL LOW (ref 12.0–15.0)
Immature Granulocytes: 0 %
Lymphocytes Relative: 17 %
Lymphs Abs: 1.7 10*3/uL (ref 0.7–4.0)
MCH: 27.9 pg (ref 26.0–34.0)
MCHC: 29.6 g/dL — ABNORMAL LOW (ref 30.0–36.0)
MCV: 94.3 fL (ref 80.0–100.0)
Monocytes Absolute: 0.6 10*3/uL (ref 0.1–1.0)
Monocytes Relative: 6 %
Neutro Abs: 7.3 10*3/uL (ref 1.7–7.7)
Neutrophils Relative %: 74 %
Platelets: 365 10*3/uL (ref 150–400)
RBC: 2.83 MIL/uL — ABNORMAL LOW (ref 3.87–5.11)
RDW: 15 % (ref 11.5–15.5)
WBC: 10 10*3/uL (ref 4.0–10.5)
nRBC: 0 % (ref 0.0–0.2)

## 2023-10-07 LAB — BRAIN NATRIURETIC PEPTIDE: B Natriuretic Peptide: 1350.5 pg/mL — ABNORMAL HIGH (ref 0.0–100.0)

## 2023-10-07 LAB — MAGNESIUM: Magnesium: 2.1 mg/dL (ref 1.7–2.4)

## 2023-10-07 LAB — BLOOD GAS, VENOUS
Acid-Base Excess: 0.5 mmol/L (ref 0.0–2.0)
Bicarbonate: 25.4 mmol/L (ref 20.0–28.0)
O2 Saturation: 93.1 %
Patient temperature: 37
pCO2, Ven: 41 mm[Hg] — ABNORMAL LOW (ref 44–60)
pH, Ven: 7.4 (ref 7.25–7.43)
pO2, Ven: 63 mm[Hg] — ABNORMAL HIGH (ref 32–45)

## 2023-10-07 LAB — TROPONIN I (HIGH SENSITIVITY)
Troponin I (High Sensitivity): 24 ng/L — ABNORMAL HIGH (ref <18)
Troponin I (High Sensitivity): 25 ng/L — ABNORMAL HIGH (ref <18)

## 2023-10-07 MED ORDER — FUROSEMIDE 10 MG/ML IJ SOLN
40.0000 mg | Freq: Once | INTRAMUSCULAR | Status: AC
Start: 2023-10-07 — End: 2023-10-07
  Administered 2023-10-07: 40 mg via INTRAVENOUS
  Filled 2023-10-07: qty 4

## 2023-10-07 MED ORDER — MORPHINE SULFATE (PF) 4 MG/ML IV SOLN: 4.0000 mg | Freq: Once | INTRAVENOUS | Status: DC

## 2023-10-07 NOTE — ED Triage Notes (Signed)
Pt BIB EMS coming from home c/o of hyperglycemia. States blood sugar has been running high for over a week now. Has increased thirst and leg cramps, edema on BLE c/o of pain 10/10 on legs. Hx Of CHF. DM.Denies N/V, SHOB.    BP 145/79, Hr 70, Spo2 96% RA, CBG 554 22g Right hand

## 2023-10-07 NOTE — ED Provider Notes (Signed)
[place] EMERGENCY DEPARTMENT AT [facility] Provider Note   CSN: [id number] Arrival date & time: 10/07/23  1333     History  Chief Complaint  Patient presents with   Hyperglycemia    [name] is a 62 y.o. female.  HPI   62 year old female with past medical history of atrial fibrillation on AC, CHF, DM presents emergency department with elevated blood sugar, increased thirst and leg/hand cramps.  Patient states she been compliant with her medication but her blood sugar has been elevated and not responding.  She admits to having a poor diet.  She complains of chronic edema of the lower extremities with leg cramps and now cramps in the hands.  She feels like she is retaining fluid but denies any specific chest tightness, shortness of breath, productive cough.  Denies any fever.  Home Medications Prior to Admission medications   Medication Sig Start Date End Date Taking? Authorizing Provider  ACCU-CHEK GUIDE test strip USE TO CHECK BLOOD SUGAR FOUR TIMES DAILY 02/25/20   [provider]  acetaminophen (TYLENOL) 500 MG tablet Take 1,000 mg by mouth every 6 (six) hours as needed for headache (pain).    [provider]  albuterol (VENTOLIN HFA) 108 (90 Base) MCG/ACT inhaler USE 2 PUFFS BY MOUTH EVERY FOUR HOURS, AS NEEDED, FOR COUGHING/WHEEZING 07/12/23   [name], MD  apixaban (ELIQUIS) 5 MG TABS tablet Take 1 tablet (5 mg total) by mouth 2 (two) times daily. 07/05/23   Clegg, [name] D, NP  budesonide-formoterol (SYMBICORT) 160-4.5 MCG/ACT inhaler Inhale 2 puffs into the lungs 2 (two) times daily. 07/12/23   [name], MD  buPROPion ER (WELLBUTRIN SR) 100 MG 12 hr tablet Take 1 tablet (100 mg total) by mouth daily. 07/05/23   Clegg, [name] D, NP  Cholecalciferol (VITAMIN D3 PO) Take 1 capsule by mouth every morning.    [provider]  colchicine 0.6 MG tablet Take 1.2 mg by mouth See admin instructions. Take 1.2 mg for flair up and an hour  later take 0.6 mg if needed Patient not taking: Reported on 10/03/2023    [provider]  EASY COMFORT PEN NEEDLES 31G X 5 MM MISC USE FOUR TIMES A DAY FOR INSULIN ADMINISTRATION 08/30/22   Georgian Co M, PA-C  fluticasone (FLONASE) 50 MCG/ACT nasal spray Place 1 spray into both nostrils as needed for allergies or rhinitis.    [provider]  insulin lispro (HUMALOG) 100 UNIT/ML KwikPen Inject 10 Units into the skin 3 (three) times daily with meals. 04/05/23   [name], MD  LANTUS SOLOSTAR 100 UNIT/ML Solostar Pen Inject 37 Units into the skin daily. 04/05/23   [name], MD  levothyroxine (SYNTHROID) 50 MCG tablet TAKE 1 TABLET (50 MCG TOTAL) BY MOUTH DAILY BEFORE BREAKFAST (AM) 05/02/23   [name], MD  metolazone (ZAROXOLYN) 2.5 MG tablet Take 1 tablet (2.5 mg total) by mouth once a week. Every Wednesday 10/03/23 01/01/24  Laurey Morale, MD  metoprolol succinate (TOPROL-XL) 25 MG 24 hr tablet Take 1 tablet (25 mg total) by mouth daily. 09/28/23 10/28/23  Jerald Kief, MD  nystatin cream (MYCOSTATIN) Apply topically 2 (two) times daily. 08/05/23   Kathlen Mody, MD  pantoprazole (PROTONIX) 40 MG tablet TAKE 1 TABLET (40 MG TOTAL) BY MOUTH DAILY(AM) 05/02/23   [name], MD  polyethylene glycol (MIRALAX / GLYCOLAX) 17 g packet Take 17 g by mouth daily as needed for moderate constipation. 08/05/23   Kathlen Mody,  MD  potassium chloride SA (KLOR-CON M20) 20 MEQ tablet Take 2 tablets (40 mEq total) by mouth daily. 10/03/23 01/01/24  Laurey Morale, MD  rosuvastatin (CRESTOR) 40 MG tablet TAKE 1 TABLET (40 MG TOTAL) BY MOUTH DAILY (BEDTIME) 08/31/23   Cannon Kettle, PA-C  torsemide (DEMADEX) 100 MG tablet Take 1 tablet (100 mg total) by mouth 2 (two) times daily. 10/03/23   Laurey Morale, MD  triamcinolone ointment (KENALOG) 0.1 % Apply topically 2 (two) times daily. 08/09/23   [name], MD  VITAMIN E PO Take 1 tablet by mouth daily.     [provider]  tiotropium (SPIRIVA HANDIHALER) 18 MCG inhalation capsule Place 1 capsule (18 mcg total) into inhaler and inhale every morning. Patient not taking: Reported on 02/09/2021 12/09/19 02/09/21  [name], MD      Allergies    Gabapentin and Lyrica [pregabalin]    Review of Systems   Review of Systems  Constitutional:  Positive for appetite change and fatigue. Negative for fever.  Respiratory:  Negative for cough, chest tightness and shortness of breath.   Cardiovascular:  Positive for leg swelling. Negative for chest pain and palpitations.  Gastrointestinal:  Negative for abdominal pain, diarrhea and vomiting.  Skin:  Negative for rash.  Neurological:  Negative for headaches.    Physical Exam Updated Vital Signs BP (!) 152/76 (BP Location: Right Arm)   Pulse 64   Temp 98.5 F (36.9 C) (Oral)   Resp 18   Ht 5\' 4"  (1.626 m)   Wt 108 kg   LMP  (LMP Unknown)   SpO2 100%   BMI 40.85 kg/m  Physical Exam Vitals and nursing note reviewed.  Constitutional:      Appearance: Normal appearance. She is obese.  HENT:     Head: Normocephalic.     Mouth/Throat:     Mouth: Mucous membranes are moist.  Cardiovascular:     Rate and Rhythm: Normal rate.  Pulmonary:     Effort: Pulmonary effort is normal. No respiratory distress.  Abdominal:     Palpations: Abdomen is soft.     Tenderness: There is no abdominal tenderness. There is no guarding or rebound.  Musculoskeletal:     Comments: Currently getting edema to bilateral lower extremities, feet are wrapped  Skin:    General: Skin is warm.  Neurological:     Mental Status: She is alert and oriented to person, place, and time. Mental status is at baseline.  Psychiatric:        Mood and Affect: Mood normal.     ED Results / Procedures / Treatments   Labs (all labs ordered are listed, but only abnormal results are displayed) Labs Reviewed  CBC WITH DIFFERENTIAL/PLATELET  COMPREHENSIVE METABOLIC PANEL   BRAIN NATRIURETIC PEPTIDE  MAGNESIUM  BLOOD GAS, VENOUS  URINALYSIS, ROUTINE W REFLEX MICROSCOPIC  TROPONIN I (HIGH SENSITIVITY)    EKG None  Radiology No results found.  Procedures Procedures    Medications Ordered in ED Medications - No data to display  ED Course/ Medical Decision Making/ A&P                                 Medical Decision Making Amount and/or Complexity of Data Reviewed Labs: ordered. Radiology: ordered.  Risk Prescription drug management.   62 year old female presents to the emergency department with concern for elevated blood sugar as well as muscle cramping  with acute on chronic lower extremity swelling.  Patient states has been compliant with her medications, including her diuretic.  She is complaining of cramping in her lower extremities and also her bilateral hands.  Vitals are stable on arrival.  Blood work is ultimately baseline for the patient.  She has a mild AKI, mild anemia, chronically elevated troponin.  Chest x-ray without acute finding.  No active chest pain, EKG without ischemic changes.  Blood sugar is slightly elevated in the 200s without any other changes.  No findings of DKA/HHS.  Magnesium is normal.  Urinalysis shows no UTI.  Lower extremities are chronically edematous, the feet are wrapped with no signs of spreading cellulitis or other complication.  Will plan for dose of diuresis and outpatient follow-up.  Educated the patient on proper diet changes in regards to hyperglycemia.  Patient at this time appears safe and stable for discharge and close outpatient follow up. Discharge plan and strict return to ED precautions discussed, patient verbalizes understanding and agreement.        Final Clinical Impression(s) / ED Diagnoses Final diagnoses:  None    Rx / DC Orders ED Discharge Orders     None         Rozelle Logan, DO 10/07/23 2004

## 2023-10-07 NOTE — Discharge Instructions (Signed)
You have been seen and discharged from the emergency department.  Your blood work showed blood sugars in the 200s, otherwise the blood work was normal for you.  You were given a dose of IV diuretic to help with chronic edema in the lower extremity/leg cramps.  Follow-up with your primary provider for further evaluation and further care. Take home medications as prescribed. If you have any worsening symptoms or further concerns for your health please return to an emergency department for further evaluation.

## 2023-10-07 NOTE — ED Notes (Addendum)
Observed patient's O2 dropping to as low as 86% on RA while patient is asleep. Pt was woken up and asked to take deep breathes and patient's O2 rose to 99-100% RA. Patient keeps falling asleep. Placed on 2L [place] to keep O2 sat above 95% RA while patient is sleeping. Dr. Wilkie Aye made aware.

## 2023-10-09 ENCOUNTER — Telehealth (HOSPITAL_COMMUNITY): Payer: Self-pay | Admitting: Cardiology

## 2023-10-09 NOTE — Progress Notes (Unsigned)
[facility] [place]  ASSESSMENT / PLAN: [name] is a 62 y.o. female status post right femoropopliteal angioplasty and stenting (6x117mm + 6x43mm Eluvia) on 08/11/23.  She is slowly healing a right plantar ulcer.  Recommend:  Abstinence from all tobacco products. Blood glucose control with goal A1c < 7%. Blood pressure control with goal blood pressure < 140/90 mmHg. Lipid reduction therapy with goal LDL-C <100 mg/dL  Aspirin 81mg  PO daily.  Clopidogrel 75mg  PO daily Atorvastatin 40-80mg  PO daily (or other "high intensity" statin therapy).  Patient is dyspneic with significant swelling in her lower extremities.  She feels symptomatic from heart failure.  I encouraged her to go to the hospital for further evaluation.  CHIEF COMPLAINT: follow up PAD  HISTORY OF PRESENT ILLNESS: [name] is a 62 y.o. female who presents to clinic for evaluation of right foot ulceration after referral by PA Stone.  She is a prior patient of my partner, Dr. Tawanna Cooler Early who performed a left common femoral to posterior tibial artery bypass for limb threatening ischemia in 2020.  She has noticed development of a right plantar ulcer which is very painful to her.  She has previously undergone TMA.  I reviewed the rationale for an angiogram for limb salvage.  She is understanding and willing to proceed.  09/11/23: Patient returns to clinic for follow-up after angiogram.  I reviewed her angiogram results in detail personally and with her.  Patient essentially has a trifurcation occlusion with reconstitution of the distal peroneal artery with limited runoff in the foot.  Her right plantar ulcer is healing slowly, per her report.  She has many challenges to reconstruction with the bypass including chronic venous insufficiency and obesity making wound healing difficult.  10/10/23: Patient presents to clinic for evaluation of her right foot wound, but is highly symptomatic from dyspnea and lower  extremity leg swelling.  She reports new blistering in bilateral lower extremities.  She wants to go hospital.  I encouraged her to do so.  Past Medical History:  Diagnosis Date   Alcohol abuse    Alcoholic cirrhosis ([name])    Anemia    Anxiety    Arthritis    "knees" (11/26/2018)   B12 deficiency    CAD (coronary artery disease)    a. 11/10/2014 Cath: LM nl, LAD min irregs, D1 30 ost, D2 50d, LCX 66m, OM1 80 p/m (1.5 mm vessel), OM2 60m, RCA nondom 76m-->med rx.. Demand ischemia in the setting of rapid a-fib.   Cardiomyopathy ([name])    Carotid artery disease ([name])    a. 01/2015 Carotid Angio: RICA 100, LICA 95p; b. 02/2015 s/p L CEA; c. 05/2019 Carotid U/S: RICA 100. RECA >50. LICA 1-39%.   Cellulitis    lower extremities   CHF (congestive heart failure) ([name])    Chronic combined systolic and diastolic CHF (congestive heart failure) ([name])    a. 10/2014 Echo: EF 40-45%; b. 10/2018 Echo: EF 45-50%, gr2 DD; c. 11/2019 Echo: EF 50%, mild LVH, gr2 DD (restrictive), antlat HK, Nl RV fxn. Mild BAE. RVSP 59.72mmHg.   CKD (chronic kidney disease), stage III ([name])    Cocaine abuse ([name])    COPD (chronic obstructive pulmonary disease) ([name])    Critical lower limb ischemia ([name])    Depression    Diabetes mellitus without complication ([name])    Diabetic peripheral neuropathy ([name])    DVT (deep venous thrombosis) ([name])    Dyspnea    Elevated troponin  a. Chronic elevation.   GERD (gastroesophageal reflux disease)    Hyperlipemia    Hypertension    Hypokalemia    Hypomagnesemia    Hypothyroidism    Marijuana abuse    Narcotic abuse ([name])    Noncompliance    NSVT (nonsustained ventricular tachycardia) ([name])    Obesity    PAF (paroxysmal atrial fibrillation) ([name])    Paroxysmal atrial tachycardia ([name])    Peripheral arterial disease ([name])    a. 01/2015 Angio/PTA: RSFA 99 (atherectomy/pta) - 1 vessel runoff via diff dzs peroneal; b. 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    Pneumonia    "once or twice" (11/26/2018)   Poorly controlled type 2 diabetes mellitus ([name])    Renal disorder    Renal insufficiency    a. 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   Sleep apnea    "couldn't handle wearing the mask" (11/26/2018)   Symptomatic bradycardia    a. Avoid AV blocking agent per EP. Prev req temp wire in 2017.   Tobacco abuse     Past Surgical History:  Procedure Laterality Date   ABDOMINAL AORTOGRAM N/A 06/26/2019   Procedure: ABDOMINAL AORTOGRAM;  Surgeon: Iran Ouch, MD;  Location: [facility];  Service: Cardiovascular;  Laterality: N/A;   ABDOMINAL AORTOGRAM W/LOWER EXTREMITY N/A 06/07/2021   Procedure: ABDOMINAL AORTOGRAM W/LOWER EXTREMITY;  Surgeon: Runell Gess, MD;  Location: [facility];  Service: Cardiovascular;  Laterality: N/A;   ABDOMINAL AORTOGRAM W/LOWER EXTREMITY N/A 08/11/2023   Procedure: ABDOMINAL AORTOGRAM W/LOWER EXTREMITY;  Surgeon: Leonie Douglas, MD;  Location: [facility];  Service: Cardiovascular;  Laterality: N/A;   AMPUTATION Right 06/14/2017   Procedure: Right foot transmetatarsal amputation;  Surgeon: Nadara Mustard, MD;  Location: [facility] OR;  Service: Orthopedics;  Laterality: Right;   AMPUTATION Left 06/28/2019   Procedure: AMPUTATION LEFT FIFTH TOE;  Surgeon: Larina Earthly, MD;  Location: [facility] OR;  Service: Vascular;  Laterality: Left;   AMPUTATION TOE Right 04/28/2017   Procedure: AMPUTATION OF RIGHT SECOND RAY;  Surgeon: Nadara Mustard, MD;  Location: [facility] OR;  Service: Orthopedics;  Laterality: Right;   CARDIAC CATHETERIZATION     CARDIAC CATHETERIZATION N/A 01/12/2016   Procedure: Temporary Wire;  Surgeon: Rollene Rotunda, MD;  Location: [facility];  Service: Cardiovascular;  Laterality: N/A;   CARDIOVERSION  ~ 02/2013   "twice"    CARDIOVERSION N/A 09/26/2023   Procedure: CARDIOVERSION (CATH LAB);  Surgeon: Maisie Fus, MD;  Location: [facility] INVASIVE CV LAB;  Service: Cardiovascular;  Laterality: N/A;   CAROTID  ANGIOGRAM N/A 01/15/2015   Procedure: CAROTID ANGIOGRAM;  Surgeon: Runell Gess, MD;  Location: [facility] CATH LAB;  Service: Cardiovascular;  Laterality: N/A;   DILATION AND CURETTAGE OF UTERUS  1988   ENDARTERECTOMY Left 02/19/2015   Procedure: LEFT CAROTID ENDARTERECTOMY ;  Surgeon: Nada Libman, MD;  Location: [facility] OR;  Service: Vascular;  Laterality: Left;   FEMORAL-TIBIAL BYPASS GRAFT Left 06/28/2019   Procedure: BYPASS GRAFT LEFT LEG FEMORAL TO ANTERIOR TIBIAL ARTERY using LEFT GREATER SAPHENOUS VEIN;  Surgeon: Larina Earthly, MD;  Location: [facility] OR;  Service: Vascular;  Laterality: Left;   LEFT HEART CATHETERIZATION WITH CORONARY ANGIOGRAM N/A 10/31/2014   Procedure: LEFT HEART CATHETERIZATION WITH CORONARY ANGIOGRAM;  Surgeon: Kathleene Hazel, MD;  Location: [facility] CATH LAB;  Service: Cardiovascular;  Laterality: N/A;   LOWER EXTREMITY ANGIOGRAM N/A 09/10/2013  Procedure: LOWER EXTREMITY ANGIOGRAM;  Surgeon: Runell Gess, MD;  Location: [facility] CATH LAB;  Service: Cardiovascular;  Laterality: N/A;   LOWER EXTREMITY ANGIOGRAM N/A 01/15/2015   Procedure: LOWER EXTREMITY ANGIOGRAM;  Surgeon: Runell Gess, MD;  Location: [facility] CATH LAB;  Service: Cardiovascular;  Laterality: N/A;   LOWER EXTREMITY ANGIOGRAPHY N/A 04/13/2017   Procedure: Lower Extremity Angiography;  Surgeon: Runell Gess, MD;  Location: [facility] INVASIVE CV LAB;  Service: Cardiovascular;  Laterality: N/A;   LOWER EXTREMITY ANGIOGRAPHY Left 06/26/2019   Procedure: LOWER EXTREMITY ANGIOGRAPHY;  Surgeon: Iran Ouch, MD;  [facility];  Service: Cardiovascular;  Laterality: Left;   PERIPHERAL VASCULAR ATHERECTOMY Right 06/07/2021   Procedure: PERIPHERAL VASCULAR ATHERECTOMY;  Surgeon: Runell Gess, MD;  Location: [facility] INVASIVE CV LAB;  Service: Cardiovascular;  Laterality: Right;   PERIPHERAL VASCULAR BALLOON ANGIOPLASTY Left 06/26/2019   Procedure: PERIPHERAL VASCULAR BALLOON ANGIOPLASTY;  Surgeon: Iran Ouch, MD;  [facility]: [facility];  Service: Cardiovascular;  Laterality: Left;  unable to cross lt sfa occlusion   PERIPHERAL VASCULAR INTERVENTION  04/13/2017   Procedure: Peripheral Vascular Intervention;  Surgeon: Runell Gess, MD;  Location: [facility] INVASIVE CV LAB;  Service: Cardiovascular;;   PERIPHERAL VASCULAR INTERVENTION  08/11/2023   Procedure: PERIPHERAL VASCULAR INTERVENTION;  Surgeon: Leonie Douglas, MD;  Location: [facility];  Service: Cardiovascular;;   PRESSURE SENSOR/CARDIOMEMS N/A 02/05/2020   Procedure: PRESSURE SENSOR/CARDIOMEMS;  Surgeon: Laurey Morale, MD;  Location: [facility] INVASIVE CV LAB;  Service: Cardiovascular;  Laterality: N/A;   VEIN HARVEST Left 06/28/2019   Procedure: LEFT LEG GREATER SAPHENOUS VEIN HARVEST;  Surgeon: Larina Earthly, MD;  Location: [facility] OR;  Service: Vascular;  Laterality: Left;    Family History  Problem Relation Age of Onset   Hypertension Mother    Diabetes Mother    Cancer Mother        breast, ovarian, colon   Clotting disorder Mother    Heart disease Mother    Heart attack Mother    Breast cancer Mother        in 78's   Hypertension Father    Heart disease Father    Emphysema Sister        smoked    Social History   Socioeconomic History   Marital status: Single    Spouse name: Not on file   Number of children: 1   Years of education: 12   Highest education level: 12th grade  Occupational History   Occupation: disabled  Tobacco Use   Smoking status: Former    Current packs/day: 1.00    Average packs/day: 1 pack/day for 44.0 years (44.0 ttl pk-yrs)    Types: E-cigarettes, Cigarettes   Smokeless tobacco: Former    Types: Snuff  Vaping Use   Vaping status: Former   Devices: 11/26/2018 "stopped months ago"  Substance and Sexual Activity   Alcohol use: Not Currently    Comment: occ   Drug use: Not Currently    Types: "Crack" cocaine, Marijuana, Oxycodone   Sexual activity: Not Currently  Other Topics Concern    Not on file  Social History Narrative   ** Merged History Encounter **       Lives in [place], in motel with sister.  They are looking to move but don't have a place to go yet.     Social Determinants of Health   Financial Resource Strain: Low Risk  (02/09/2023)  Overall Financial Resource Strain (CARDIA)    Difficulty of Paying Living Expenses: Not hard at all  Food Insecurity: No Food Insecurity (09/23/2023)   Hunger Vital Sign    Worried About Running Out of Food in the Last Year: Never true    Ran Out of Food in the Last Year: Never true  Transportation Needs: No Transportation Needs (09/23/2023)   PRAPARE - Administrator, Civil Service (Medical): No    Lack of Transportation (Non-Medical): No  Physical Activity: Inactive (02/09/2023)   Exercise Vital Sign    Days of Exercise per Week: 0 days    Minutes of Exercise per Session: 0 min  Stress: No Stress Concern Present (02/09/2023)   [employer] of Occupational Health - Occupational Stress Questionnaire    Feeling of Stress : Not at all  Social Connections: Unknown (08/21/2023)   Received from [facility]   Social Network    Social Network: Not on file  Intimate Partner Violence: Not At Risk (09/23/2023)   Humiliation, Afraid, Rape, and Kick questionnaire    Fear of Current or Ex-Partner: No    Emotionally Abused: No    Physically Abused: No    Sexually Abused: No    Allergies  Allergen Reactions   Gabapentin Nausea And Vomiting and Other (See Comments)    POSSIBLE SHAKING   Lyrica [Pregabalin] Other (See Comments)    Shaking     Current Outpatient Medications  Medication Sig Dispense Refill   ACCU-CHEK GUIDE test strip USE TO CHECK BLOOD SUGAR FOUR TIMES DAILY     acetaminophen (TYLENOL) 500 MG tablet Take 1,000 mg by mouth every 6 (six) hours as needed for headache (pain).     albuterol (VENTOLIN HFA) 108 (90 Base) MCG/ACT inhaler USE 2 PUFFS BY MOUTH EVERY FOUR HOURS, AS NEEDED, FOR  COUGHING/WHEEZING 8.5 g 0   apixaban (ELIQUIS) 5 MG TABS tablet Take 1 tablet (5 mg total) by mouth 2 (two) times daily. 60 tablet 6   budesonide-formoterol (SYMBICORT) 160-4.5 MCG/ACT inhaler Inhale 2 puffs into the lungs 2 (two) times daily. 1 each 2   buPROPion ER (WELLBUTRIN SR) 100 MG 12 hr tablet Take 1 tablet (100 mg total) by mouth daily. 30 tablet 6   Cholecalciferol (VITAMIN D3 PO) Take 1 capsule by mouth every morning.     colchicine 0.6 MG tablet Take 1.2 mg by mouth See admin instructions. Take 1.2 mg for flair up and an hour later take 0.6 mg if needed (Patient not taking: Reported on 10/03/2023)     EASY COMFORT PEN NEEDLES 31G X 5 MM MISC USE FOUR TIMES A DAY FOR INSULIN ADMINISTRATION 100 each 1   fluticasone (FLONASE) 50 MCG/ACT nasal spray Place 1 spray into both nostrils as needed for allergies or rhinitis.     insulin lispro (HUMALOG) 100 UNIT/ML KwikPen Inject 10 Units into the skin 3 (three) times daily with meals. 15 mL 0   LANTUS SOLOSTAR 100 UNIT/ML Solostar Pen Inject 37 Units into the skin daily. 15 mL 0   levothyroxine (SYNTHROID) 50 MCG tablet TAKE 1 TABLET (50 MCG TOTAL) BY MOUTH DAILY BEFORE BREAKFAST (AM) 30 tablet 0   metolazone (ZAROXOLYN) 2.5 MG tablet Take 1 tablet (2.5 mg total) by mouth once a week. Every Wednesday 90 tablet 3   metoprolol succinate (TOPROL-XL) 25 MG 24 hr tablet Take 1 tablet (25 mg total) by mouth daily. 30 tablet 0   nystatin cream (MYCOSTATIN) Apply topically 2 (two) times daily.  30 g 0   pantoprazole (PROTONIX) 40 MG tablet TAKE 1 TABLET (40 MG TOTAL) BY MOUTH DAILY(AM) 30 tablet 0   polyethylene glycol (MIRALAX / GLYCOLAX) 17 g packet Take 17 g by mouth daily as needed for moderate constipation. 14 each 0   potassium chloride SA (KLOR-CON M20) 20 MEQ tablet Take 2 tablets (40 mEq total) by mouth daily. 180 tablet 3   rosuvastatin (CRESTOR) 40 MG tablet TAKE 1 TABLET (40 MG TOTAL) BY MOUTH DAILY (BEDTIME) 90 tablet 0   torsemide (DEMADEX)  100 MG tablet Take 1 tablet (100 mg total) by mouth 2 (two) times daily. 180 tablet 3   triamcinolone ointment (KENALOG) 0.1 % Apply topically 2 (two) times daily. 454 g 0   VITAMIN E PO Take 1 tablet by mouth daily.     No current facility-administered medications for this visit.    PHYSICAL EXAM There were no vitals filed for this visit.    Chronically unwell woman.  Obese. Left dorsalis pedis pulse weakly palpable Left fifth toe surgically absent No palpable pulses in right foot Both legs wrapped with serous drainage  PERTINENT LABORATORY AND RADIOLOGIC DATA  Most recent CBC    Latest Ref Rng & Units 10/07/2023    2:48 PM 09/26/2023    5:52 AM 09/24/2023    4:01 AM  CBC  WBC 4.0 - 10.5 K/uL 10.0  13.3  12.3   Hemoglobin 12.0 - 15.0 g/dL 7.9  8.4  8.4   Hematocrit 36.0 - 46.0 % 26.7  27.4  27.1   Platelets 150 - 400 K/uL 365  261  239      Most recent CMP    Latest Ref Rng & Units 10/07/2023    2:48 PM 10/03/2023    3:30 PM 09/26/2023    5:52 AM  CMP  Glucose 70 - 99 mg/dL 308  657  846   BUN 6 - 20 mg/dL 44  53  61   Creatinine 0.44 - 1.00 mg/dL 9.62  9.52  8.41   Sodium 135 - 145 mmol/L 138  136  134   Potassium 3.5 - 5.1 mmol/L 4.0  4.3  4.0   Chloride 98 - 111 mmol/L 107  103  98   CO2 22 - 32 mmol/L 23  21  24    Calcium 8.9 - 10.3 mg/dL 8.5  9.1  8.5   Total Protein 6.5 - 8.1 g/dL 6.9     Total Bilirubin <1.2 mg/dL 0.3     Alkaline Phos 38 - 126 U/L 271     AST 15 - 41 U/L 12     ALT 0 - 44 U/L 24       Renal function Estimated Creatinine Clearance: 55.6 mL/min (A) (by C-G formula based on SCr of 1.29 mg/dL (H)).  Hemoglobin A1C (no units)  Date Value  06/21/2017 9.8   HbA1c, POC (controlled diabetic range) (%)  Date Value  09/13/2022 9.6 (A)   Hgb A1c MFr Bld (%)  Date Value  09/25/2023 12.0 (H)    LDL Cholesterol  Date Value Ref Range Status  11/29/2022 46 0 - 99 mg/dL Final    Comment:           Total Cholesterol/HDL:CHD  Risk Coronary Heart Disease Risk Table                     Men   Women  1/2 Average Risk   3.4   3.3  Average  Risk       5.0   4.4  2 X Average Risk   9.6   7.1  3 X Average Risk  23.4   11.0        Use the calculated Patient Ratio above and the CHD Risk Table to determine the patient's CHD Risk.        ATP III CLASSIFICATION (LDL):  <100     mg/dL   Optimal  098-119  mg/dL   Near or Above                    Optimal  130-159  mg/dL   Borderline  147-829  mg/dL   High  >562     mg/dL   Very High Performed at [facility] Lab, [address] [place], [place] [address]    Direct LDL  Date Value Ref Range Status  04/04/2022 90.8 0 - 99 mg/dL Final    Comment:    Performed at [facility] Lab, [address], [place], [place] [address]     +-------+-----------+-----------+------------+------------+  ABI/TBIToday's ABIToday's TBIPrevious ABIPrevious TBI  +-------+-----------+-----------+------------+------------+  Right 0.6        amp        0.57        amp           +-------+-----------+-----------+------------+------------+  Left  1.03       0.72       1.04        1             +-------+-----------+-----------+------------+------------+   Lower extremity duplex Patent stenting of the right lower extremity  Rande Brunt. Lenell Antu, MD FACS [facility] Phone Number: [phone] 10/09/2023 12:18 PM   Total time spent on preparing this encounter including chart review, data review, collecting history, examining the patient, coordinating care for this established patient, 30 minutes  Portions of this report may have been transcribed using voice recognition software.  Every effort has been made to ensure accuracy; however, inadvertent computerized transcription errors may still be present.

## 2023-10-09 NOTE — Telephone Encounter (Signed)
Attempted to contact  [facility]

## 2023-10-09 NOTE — Telephone Encounter (Signed)
-----   Message from [facility] [name] L sent at 10/09/2023  2:55 PM EST ----- Just FYI her other furoscix didn't get delivered despite me telling them again to call to get it scheduled, it was not done.  So anyways- she only got the 2 doses of furoscix.  I told her again today to call to get it set it, so that way she has it on hand, but I will be seeing her in the morning.    Kerry Hough, EMT-Paramedic  [phone] 10/09/2023

## 2023-10-10 ENCOUNTER — Inpatient Hospital Stay (HOSPITAL_COMMUNITY)
Admission: EM | Admit: 2023-10-10 | Discharge: 2023-10-13 | DRG: 291 | Disposition: A | Discharge status: Home Health | Payer: 59 | Attending: Internal Medicine | Admitting: Internal Medicine

## 2023-10-10 ENCOUNTER — Other Ambulatory Visit (HOSPITAL_COMMUNITY): Payer: Self-pay

## 2023-10-10 ENCOUNTER — Emergency Department (HOSPITAL_COMMUNITY): Payer: 59

## 2023-10-10 ENCOUNTER — Ambulatory Visit (INDEPENDENT_AMBULATORY_CARE_PROVIDER_SITE_OTHER): Payer: 59 | Admitting: Vascular Surgery

## 2023-10-10 ENCOUNTER — Encounter: Payer: Self-pay | Admitting: Vascular Surgery

## 2023-10-10 ENCOUNTER — Encounter (HOSPITAL_COMMUNITY): Payer: Self-pay

## 2023-10-10 ENCOUNTER — Other Ambulatory Visit: Payer: Self-pay

## 2023-10-10 VITALS — BP 133/69 | HR 73 | Temp 98.0°F | Resp 20 | Ht 64.0 in | Wt 237.0 lb

## 2023-10-10 DIAGNOSIS — Z89431 Acquired absence of right foot: Secondary | ICD-10-CM

## 2023-10-10 DIAGNOSIS — E66812 Obesity, class 2: Secondary | ICD-10-CM | POA: Diagnosis present

## 2023-10-10 DIAGNOSIS — I110 Hypertensive heart disease with heart failure: Secondary | ICD-10-CM | POA: Diagnosis not present

## 2023-10-10 DIAGNOSIS — I10 Essential (primary) hypertension: Secondary | ICD-10-CM | POA: Diagnosis present

## 2023-10-10 DIAGNOSIS — Z7951 Long term (current) use of inhaled steroids: Secondary | ICD-10-CM

## 2023-10-10 DIAGNOSIS — D638 Anemia in other chronic diseases classified elsewhere: Secondary | ICD-10-CM | POA: Diagnosis not present

## 2023-10-10 DIAGNOSIS — I152 Hypertension secondary to endocrine disorders: Secondary | ICD-10-CM | POA: Diagnosis present

## 2023-10-10 DIAGNOSIS — I480 Paroxysmal atrial fibrillation: Secondary | ICD-10-CM | POA: Diagnosis present

## 2023-10-10 DIAGNOSIS — R509 Fever, unspecified: Secondary | ICD-10-CM | POA: Diagnosis not present

## 2023-10-10 DIAGNOSIS — E1142 Type 2 diabetes mellitus with diabetic polyneuropathy: Secondary | ICD-10-CM | POA: Diagnosis present

## 2023-10-10 DIAGNOSIS — Z9862 Peripheral vascular angioplasty status: Secondary | ICD-10-CM | POA: Diagnosis not present

## 2023-10-10 DIAGNOSIS — I70235 Atherosclerosis of native arteries of right leg with ulceration of other part of foot: Secondary | ICD-10-CM | POA: Diagnosis not present

## 2023-10-10 DIAGNOSIS — I5042 Chronic combined systolic (congestive) and diastolic (congestive) heart failure: Secondary | ICD-10-CM | POA: Diagnosis present

## 2023-10-10 DIAGNOSIS — Z1152 Encounter for screening for COVID-19: Secondary | ICD-10-CM

## 2023-10-10 DIAGNOSIS — Z832 Family history of diseases of the blood and blood-forming organs and certain disorders involving the immune mechanism: Secondary | ICD-10-CM

## 2023-10-10 DIAGNOSIS — E1151 Type 2 diabetes mellitus with diabetic peripheral angiopathy without gangrene: Secondary | ICD-10-CM | POA: Diagnosis present

## 2023-10-10 DIAGNOSIS — E1122 Type 2 diabetes mellitus with diabetic chronic kidney disease: Secondary | ICD-10-CM | POA: Diagnosis present

## 2023-10-10 DIAGNOSIS — I5043 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) and diastolic (congestive) heart failure: Secondary | ICD-10-CM | POA: Diagnosis present

## 2023-10-10 DIAGNOSIS — I6521 Occlusion and stenosis of right carotid artery: Secondary | ICD-10-CM

## 2023-10-10 DIAGNOSIS — Z79890 Hormone replacement therapy (postmenopausal): Secondary | ICD-10-CM

## 2023-10-10 DIAGNOSIS — J449 Chronic obstructive pulmonary disease, unspecified: Secondary | ICD-10-CM | POA: Diagnosis not present

## 2023-10-10 DIAGNOSIS — R600 Localized edema: Secondary | ICD-10-CM | POA: Diagnosis not present

## 2023-10-10 DIAGNOSIS — Z6841 Body Mass Index (BMI) 40.0 and over, adult: Secondary | ICD-10-CM

## 2023-10-10 DIAGNOSIS — E038 Other specified hypothyroidism: Secondary | ICD-10-CM

## 2023-10-10 DIAGNOSIS — Z993 Dependence on wheelchair: Secondary | ICD-10-CM

## 2023-10-10 DIAGNOSIS — I739 Peripheral vascular disease, unspecified: Secondary | ICD-10-CM | POA: Diagnosis present

## 2023-10-10 DIAGNOSIS — F3341 Major depressive disorder, recurrent, in partial remission: Secondary | ICD-10-CM

## 2023-10-10 DIAGNOSIS — E1169 Type 2 diabetes mellitus with other specified complication: Secondary | ICD-10-CM | POA: Diagnosis present

## 2023-10-10 DIAGNOSIS — Z794 Long term (current) use of insulin: Secondary | ICD-10-CM

## 2023-10-10 DIAGNOSIS — E1165 Type 2 diabetes mellitus with hyperglycemia: Secondary | ICD-10-CM | POA: Diagnosis present

## 2023-10-10 DIAGNOSIS — I5033 Acute on chronic diastolic (congestive) heart failure: Secondary | ICD-10-CM | POA: Diagnosis present

## 2023-10-10 DIAGNOSIS — D509 Iron deficiency anemia, unspecified: Secondary | ICD-10-CM | POA: Diagnosis present

## 2023-10-10 DIAGNOSIS — E119 Type 2 diabetes mellitus without complications: Secondary | ICD-10-CM | POA: Diagnosis present

## 2023-10-10 DIAGNOSIS — I2510 Atherosclerotic heart disease of native coronary artery without angina pectoris: Secondary | ICD-10-CM | POA: Diagnosis present

## 2023-10-10 DIAGNOSIS — I779 Disorder of arteries and arterioles, unspecified: Secondary | ICD-10-CM | POA: Diagnosis present

## 2023-10-10 DIAGNOSIS — F32A Depression, unspecified: Secondary | ICD-10-CM | POA: Diagnosis present

## 2023-10-10 DIAGNOSIS — Z803 Family history of malignant neoplasm of breast: Secondary | ICD-10-CM

## 2023-10-10 DIAGNOSIS — E1159 Type 2 diabetes mellitus with other circulatory complications: Secondary | ICD-10-CM

## 2023-10-10 DIAGNOSIS — L97519 Non-pressure chronic ulcer of other part of right foot with unspecified severity: Secondary | ICD-10-CM | POA: Diagnosis present

## 2023-10-10 DIAGNOSIS — D649 Anemia, unspecified: Secondary | ICD-10-CM | POA: Diagnosis present

## 2023-10-10 DIAGNOSIS — Z888 Allergy status to other drugs, medicaments and biological substances status: Secondary | ICD-10-CM

## 2023-10-10 DIAGNOSIS — Z825 Family history of asthma and other chronic lower respiratory diseases: Secondary | ICD-10-CM

## 2023-10-10 DIAGNOSIS — Z79899 Other long term (current) drug therapy: Secondary | ICD-10-CM

## 2023-10-10 DIAGNOSIS — Z8249 Family history of ischemic heart disease and other diseases of the circulatory system: Secondary | ICD-10-CM

## 2023-10-10 DIAGNOSIS — N1832 Chronic kidney disease, stage 3b: Secondary | ICD-10-CM | POA: Diagnosis present

## 2023-10-10 DIAGNOSIS — K7030 Alcoholic cirrhosis of liver without ascites: Secondary | ICD-10-CM | POA: Diagnosis present

## 2023-10-10 DIAGNOSIS — Z833 Family history of diabetes mellitus: Secondary | ICD-10-CM

## 2023-10-10 DIAGNOSIS — E11621 Type 2 diabetes mellitus with foot ulcer: Secondary | ICD-10-CM | POA: Diagnosis present

## 2023-10-10 DIAGNOSIS — E039 Hypothyroidism, unspecified: Secondary | ICD-10-CM | POA: Diagnosis present

## 2023-10-10 DIAGNOSIS — E785 Hyperlipidemia, unspecified: Secondary | ICD-10-CM | POA: Diagnosis present

## 2023-10-10 DIAGNOSIS — E782 Mixed hyperlipidemia: Secondary | ICD-10-CM

## 2023-10-10 DIAGNOSIS — G4733 Obstructive sleep apnea (adult) (pediatric): Secondary | ICD-10-CM | POA: Diagnosis present

## 2023-10-10 DIAGNOSIS — F17290 Nicotine dependence, other tobacco product, uncomplicated: Secondary | ICD-10-CM | POA: Diagnosis present

## 2023-10-10 DIAGNOSIS — F17210 Nicotine dependence, cigarettes, uncomplicated: Secondary | ICD-10-CM | POA: Diagnosis present

## 2023-10-10 DIAGNOSIS — D631 Anemia in chronic kidney disease: Secondary | ICD-10-CM | POA: Diagnosis present

## 2023-10-10 DIAGNOSIS — Z7901 Long term (current) use of anticoagulants: Secondary | ICD-10-CM

## 2023-10-10 DIAGNOSIS — I509 Heart failure, unspecified: Secondary | ICD-10-CM

## 2023-10-10 DIAGNOSIS — Z89421 Acquired absence of other right toe(s): Secondary | ICD-10-CM

## 2023-10-10 DIAGNOSIS — I429 Cardiomyopathy, unspecified: Secondary | ICD-10-CM | POA: Diagnosis present

## 2023-10-10 HISTORY — DX: Acute kidney failure, unspecified: N17.9

## 2023-10-10 HISTORY — DX: Peripheral vascular disease, unspecified: I73.9

## 2023-10-10 LAB — I-STAT CHEM 8, ED
BUN: 53 mg/dL — ABNORMAL HIGH (ref 6–20)
Calcium, Ion: 1.1 mmol/L — ABNORMAL LOW (ref 1.15–1.40)
Chloride: 103 mmol/L (ref 98–111)
Creatinine, Ser: 1.4 mg/dL — ABNORMAL HIGH (ref 0.44–1.00)
Glucose, Bld: 330 mg/dL — ABNORMAL HIGH (ref 70–99)
HCT: 27 % — ABNORMAL LOW (ref 36.0–46.0)
Hemoglobin: 9.2 g/dL — ABNORMAL LOW (ref 12.0–15.0)
Potassium: 4.7 mmol/L (ref 3.5–5.1)
Sodium: 138 mmol/L (ref 135–145)
TCO2: 27 mmol/L (ref 22–32)

## 2023-10-10 LAB — MAGNESIUM: Magnesium: 2 mg/dL (ref 1.7–2.4)

## 2023-10-10 LAB — BASIC METABOLIC PANEL
Anion gap: 11 (ref 5–15)
BUN: 54 mg/dL — ABNORMAL HIGH (ref 6–20)
CO2: 26 mmol/L (ref 22–32)
Calcium: 9 mg/dL (ref 8.9–10.3)
Chloride: 102 mmol/L (ref 98–111)
Creatinine, Ser: 1.45 mg/dL — ABNORMAL HIGH (ref 0.44–1.00)
GFR, Estimated: 41 mL/min — ABNORMAL LOW (ref >60)
Glucose, Bld: 335 mg/dL — ABNORMAL HIGH (ref 70–99)
Potassium: 4.8 mmol/L (ref 3.5–5.1)
Sodium: 139 mmol/L (ref 135–145)

## 2023-10-10 LAB — CBC WITH DIFFERENTIAL/PLATELET
Abs Immature Granulocytes: 0.05 10*3/uL (ref 0.00–0.07)
Basophils Absolute: 0 10*3/uL (ref 0.0–0.1)
Basophils Relative: 0 %
Eosinophils Absolute: 0.2 10*3/uL (ref 0.0–0.5)
Eosinophils Relative: 2 %
HCT: 29.2 % — ABNORMAL LOW (ref 36.0–46.0)
Hemoglobin: 8.7 g/dL — ABNORMAL LOW (ref 12.0–15.0)
Immature Granulocytes: 1 %
Lymphocytes Relative: 11 %
Lymphs Abs: 1.2 10*3/uL (ref 0.7–4.0)
MCH: 27.6 pg (ref 26.0–34.0)
MCHC: 29.8 g/dL — ABNORMAL LOW (ref 30.0–36.0)
MCV: 92.7 fL (ref 80.0–100.0)
Monocytes Absolute: 0.6 10*3/uL (ref 0.1–1.0)
Monocytes Relative: 6 %
Neutro Abs: 8.7 10*3/uL — ABNORMAL HIGH (ref 1.7–7.7)
Neutrophils Relative %: 80 %
Platelets: 329 10*3/uL (ref 150–400)
RBC: 3.15 MIL/uL — ABNORMAL LOW (ref 3.87–5.11)
RDW: 15.1 % (ref 11.5–15.5)
WBC: 10.8 10*3/uL — ABNORMAL HIGH (ref 4.0–10.5)
nRBC: 0 % (ref 0.0–0.2)

## 2023-10-10 LAB — HEPATIC FUNCTION PANEL
ALT: 19 U/L (ref 0–44)
AST: 16 U/L (ref 15–41)
Albumin: 2.7 g/dL — ABNORMAL LOW (ref 3.5–5.0)
Alkaline Phosphatase: 210 U/L — ABNORMAL HIGH (ref 38–126)
Bilirubin, Direct: <0.1 mg/dL (ref 0.0–0.2)
Total Bilirubin: 0.3 mg/dL (ref <1.2)
Total Protein: 6.9 g/dL (ref 6.5–8.1)

## 2023-10-10 LAB — GLUCOSE, CAPILLARY: Glucose-Capillary: 322 mg/dL — ABNORMAL HIGH (ref 70–99)

## 2023-10-10 LAB — RESP PANEL BY RT-PCR (RSV, FLU A&B, COVID)  RVPGX2
Influenza A by PCR: NEGATIVE
Influenza B by PCR: NEGATIVE
Resp Syncytial Virus by PCR: NEGATIVE
SARS Coronavirus 2 by RT PCR: NEGATIVE

## 2023-10-10 LAB — TSH: TSH: 1.831 u[IU]/mL (ref 0.350–4.500)

## 2023-10-10 LAB — BRAIN NATRIURETIC PEPTIDE: B Natriuretic Peptide: 1190.2 pg/mL — ABNORMAL HIGH (ref 0.0–100.0)

## 2023-10-10 LAB — CBG MONITORING, ED
Glucose-Capillary: 316 mg/dL — ABNORMAL HIGH (ref 70–99)
Glucose-Capillary: 308 mg/dL — ABNORMAL HIGH (ref 70–99)
Glucose-Capillary: 183 mg/dL — ABNORMAL HIGH (ref 70–99)

## 2023-10-10 LAB — TROPONIN I (HIGH SENSITIVITY)
Troponin I (High Sensitivity): 24 ng/L — ABNORMAL HIGH (ref <18)
Troponin I (High Sensitivity): 27 ng/L — ABNORMAL HIGH (ref <18)

## 2023-10-10 MED ORDER — ROSUVASTATIN CALCIUM 20 MG PO TABS
40.0000 mg | ORAL_TABLET | Freq: Every day | ORAL | Status: DC
  Administered 2023-10-11 – 2023-10-13 (×3): 40 mg via ORAL
  Filled 2023-10-10 (×3): qty 2

## 2023-10-10 MED ORDER — POLYETHYLENE GLYCOL 3350 17 G PO PACK: 17.0000 g | PACK | Freq: Every day | ORAL | Status: DC | PRN

## 2023-10-10 MED ORDER — SODIUM CHLORIDE 0.9% FLUSH
3.0000 mL | Freq: Two times a day (BID) | INTRAVENOUS | Status: DC
  Administered 2023-10-10 – 2023-10-13 (×5): 3 mL via INTRAVENOUS

## 2023-10-10 MED ORDER — BUPROPION HCL ER (SR) 100 MG PO TB12
100.0000 mg | ORAL_TABLET | Freq: Every day | ORAL | Status: DC
  Administered 2023-10-11 – 2023-10-13 (×3): 100 mg via ORAL
  Filled 2023-10-10 (×3): qty 1

## 2023-10-10 MED ORDER — FUROSEMIDE 10 MG/ML IJ SOLN
40.0000 mg | Freq: Once | INTRAMUSCULAR | Status: AC
  Administered 2023-10-10: 40 mg via INTRAVENOUS
  Filled 2023-10-10: qty 4

## 2023-10-10 MED ORDER — INSULIN ASPART 100 UNIT/ML IJ SOLN
0.0000 [IU] | Freq: Every day | INTRAMUSCULAR | Status: DC
Start: 2023-10-10 — End: 2023-10-13
  Administered 2023-10-10: 4 [IU] via SUBCUTANEOUS
  Administered 2023-10-11: 2 [IU] via SUBCUTANEOUS

## 2023-10-10 MED ORDER — ALBUTEROL SULFATE HFA 108 (90 BASE) MCG/ACT IN AERS
1.0000 | INHALATION_SPRAY | RESPIRATORY_TRACT | Status: DC | PRN
Start: 2023-10-10 — End: 2023-10-10

## 2023-10-10 MED ORDER — FUROSEMIDE 10 MG/ML IJ SOLN
120.0000 mg | Freq: Two times a day (BID) | INTRAVENOUS | Status: DC
  Administered 2023-10-11: 120 mg via INTRAVENOUS
  Filled 2023-10-10 (×2): qty 12
  Filled 2023-10-10: qty 10
  Filled 2023-10-10 (×2): qty 12

## 2023-10-10 MED ORDER — ALBUTEROL SULFATE (2.5 MG/3ML) 0.083% IN NEBU: 2.5000 mg | INHALATION_SOLUTION | RESPIRATORY_TRACT | Status: DC | PRN

## 2023-10-10 MED ORDER — IPRATROPIUM-ALBUTEROL 0.5-2.5 (3) MG/3ML IN SOLN
3.0000 mL | Freq: Once | RESPIRATORY_TRACT | Status: AC
  Administered 2023-10-10: 3 mL via RESPIRATORY_TRACT
  Filled 2023-10-10: qty 3

## 2023-10-10 MED ORDER — INSULIN ASPART 100 UNIT/ML IJ SOLN
6.0000 [IU] | Freq: Once | INTRAMUSCULAR | Status: AC
  Administered 2023-10-10: 6 [IU] via INTRAVENOUS

## 2023-10-10 MED ORDER — ACETAMINOPHEN 325 MG PO TABS
650.0000 mg | ORAL_TABLET | Freq: Four times a day (QID) | ORAL | Status: DC | PRN
  Administered 2023-10-11 (×2): 650 mg via ORAL
  Filled 2023-10-10 (×2): qty 2

## 2023-10-10 MED ORDER — METOPROLOL SUCCINATE ER 25 MG PO TB24
25.0000 mg | ORAL_TABLET | Freq: Every day | ORAL | Status: DC
  Administered 2023-10-11 – 2023-10-13 (×3): 25 mg via ORAL
  Filled 2023-10-10 (×3): qty 1

## 2023-10-10 MED ORDER — INSULIN ASPART 100 UNIT/ML IJ SOLN
0.0000 [IU] | Freq: Three times a day (TID) | INTRAMUSCULAR | Status: DC
Start: 2023-10-10 — End: 2023-10-13
  Administered 2023-10-11: 15 [IU] via SUBCUTANEOUS
  Administered 2023-10-11: 4 [IU] via SUBCUTANEOUS
  Administered 2023-10-11: 20 [IU] via SUBCUTANEOUS
  Administered 2023-10-12 – 2023-10-13 (×4): 7 [IU] via SUBCUTANEOUS

## 2023-10-10 MED ORDER — MOMETASONE FURO-FORMOTEROL FUM 200-5 MCG/ACT IN AERO
2.0000 | INHALATION_SPRAY | Freq: Two times a day (BID) | RESPIRATORY_TRACT | Status: DC
  Administered 2023-10-10 – 2023-10-13 (×5): 2 via RESPIRATORY_TRACT
  Filled 2023-10-10: qty 8.8

## 2023-10-10 MED ORDER — APIXABAN 5 MG PO TABS
5.0000 mg | ORAL_TABLET | Freq: Two times a day (BID) | ORAL | Status: DC
  Administered 2023-10-10 – 2023-10-13 (×6): 5 mg via ORAL
  Filled 2023-10-10 (×6): qty 1

## 2023-10-10 MED ORDER — INSULIN ASPART 100 UNIT/ML IJ SOLN
8.0000 [IU] | Freq: Three times a day (TID) | INTRAMUSCULAR | Status: DC
  Administered 2023-10-11 (×2): 8 [IU] via SUBCUTANEOUS

## 2023-10-10 MED ORDER — INSULIN GLARGINE-YFGN 100 UNIT/ML ~~LOC~~ SOLN
30.0000 [IU] | Freq: Every day | SUBCUTANEOUS | Status: DC
  Administered 2023-10-11: 30 [IU] via SUBCUTANEOUS
  Filled 2023-10-10: qty 0.3

## 2023-10-10 MED ORDER — PANTOPRAZOLE SODIUM 40 MG PO TBEC
40.0000 mg | DELAYED_RELEASE_TABLET | Freq: Every day | ORAL | Status: DC
  Administered 2023-10-11 – 2023-10-13 (×3): 40 mg via ORAL
  Filled 2023-10-10 (×3): qty 1

## 2023-10-10 MED ORDER — ACETAMINOPHEN 650 MG RE SUPP: 650.0000 mg | Freq: Four times a day (QID) | RECTAL | Status: DC | PRN

## 2023-10-10 MED ORDER — LEVOTHYROXINE SODIUM 50 MCG PO TABS
50.0000 ug | ORAL_TABLET | Freq: Every day | ORAL | Status: DC
  Administered 2023-10-11 – 2023-10-13 (×3): 50 ug via ORAL
  Filled 2023-10-10 (×3): qty 1

## 2023-10-10 NOTE — Progress Notes (Unsigned)
Paramedicine Encounter    Patient ID: [name], female    DOB: [birth year], 62 y.o.   MRN: [id number]   Complaints-bloated   Edema-yes  Compliance with meds-yes  Pill box filled-yes If so, by whom-paramedic   Refills needed- Pantoprazole  Potassium   Pt reports she is more bloated, they didn't call to get her furoscix delivered last week. So she missed a whole day of diuretics on Friday. Her sister called yesterday and is suppose to get it delivered today. I dont think even taking her metolazone today instead of tomor is going to do anything. I feel she needs to be admitted for diuresis and her hyperglycemia.  She just looks so bloated today. She denies increased sob. No c/p. She doesn't feel good. Her CBG is so very high, she did eat noodles from [employer]inese last night. So we talked about that and even though its good tasting food, it is so bad for her and she needs to avoid it.  Sister is keeping her fluids down to less than 2L.  Her cardiomems is up.  Coughing is increased. Sounds congested and wheezy. She did use her inhaler this morning.  She wants to try to avoid hosp again-was there on sat for hyperglycemia, however with how she appears with fluids up, CBG very high I advise her to get to ER.  She has appoint with [facility] this morning in like 35 min, so she felt ok enough to go there first to f/u on these wounds and then go to ER after that.   I did complete pill box since holiday this week so she can have some meds when she returns home from hosp.  I will f/u next week.     Cbg ems-516 BP (!) 146/70   Pulse 76   Resp 18   LMP  (LMP Unknown)   SpO2 97%  Weight yesterday-? Last visit weight-238  Patient Care Team: [name], MD as PCP - General (Family Medicine) Runell Gess, MD as PCP - Cardiology (Cardiology) Lanier Prude, MD as PCP - Electrophysiology (Cardiology) Runell Gess, MD as Consulting Physician (Cardiology) Nyoka Cowden, MD as  Consulting Physician (Pulmonary Disease) System, Provider Not In  Patient Active Problem List   Diagnosis Date Noted   Atrial fibrillation with RVR ([name]) 09/23/2023   Chest pain 09/23/2023   Uncontrolled type 2 diabetes mellitus with hyperglycemia, with long-term current use of insulin ([name]) 09/23/2023   Chronic kidney disease, stage III (moderate) ([name]) 09/23/2023   Obesity (BMI 30-39.9) 09/23/2023   Severe sepsis with acute organ dysfunction ([name]) 07/27/2023   Urinary tract infection without hematuria 07/27/2023   Encephalopathy acute 07/27/2023   Acute renal failure superimposed on stage 3 chronic kidney disease ([name]) 07/27/2023   Severe sepsis ([name]) 07/27/2023   Pre-ulcerative calluses 04/25/2022   Viral gastroenteritis 04/18/2022   Hyperlipidemia associated with type 2 diabetes mellitus ([name]) 07/29/2021   (HFpEF) heart failure with preserved ejection fraction ([name]) 07/29/2021   Uncontrolled type 2 diabetes mellitus with hyperglycemia ([name]) 06/13/2021   Acute kidney injury superimposed on CKD ([name]) 06/13/2021   Critical ischemia of foot ([name]) 06/07/2021   Fall 05/05/2021   PAF (paroxysmal atrial fibrillation) ([name]) 05/05/2021   COPD (chronic obstructive pulmonary disease) ([name]) 05/05/2021   DM (diabetes mellitus), type 2 with complications ([name]) 05/05/2021   PAD (peripheral artery disease) ([name]) 05/05/2021   Cellulitis 05/05/2021   Acute on chronic combined systolic and diastolic CHF (congestive heart failure) ([name]) 05/05/2021  OSA (obstructive sleep apnea) 05/05/2021   Stage 3b chronic kidney disease ([name]) 05/05/2021   AKI (acute kidney injury) ([name]) 05/04/2021   Pressure injury of skin 05/04/2021   Ulcer of foot, unspecified laterality, limited to breakdown of skin ([name]) 02/17/2021   AF (paroxysmal atrial fibrillation) ([name]) 01/02/2021   CAD (coronary artery disease) 01/02/2021   PVD (peripheral vascular disease) ([name]) 01/02/2021   Diabetes mellitus type 2, uncontrolled,  with complications 01/02/2021   Diabetic nephropathy ([name]) 01/02/2021   Low back pain 06/01/2020   Hyperlipidemia 04/03/2020   Muscle weakness 03/20/2020   Pain in elbow 03/12/2020   PAD (peripheral artery disease) ([name]) 12/26/2019   Acute renal failure ([name])    Acute respiratory failure with hypoxia ([name]) 12/02/2019   Cellulitis in diabetic foot ([name]) 07/08/2019   Osteomyelitis ([name]) 06/21/2019   NICM (nonischemic cardiomyopathy) ([name]) 06/20/2019   Non-healing ulcer ([name]) 06/20/2019   CHF (congestive heart failure) ([name]) 11/26/2018   Coagulation disorder ([name]) 08/09/2017   Depression 07/21/2017   At risk for adverse drug reaction 06/20/2017   Peripheral neuropathy 06/20/2017   S/P transmetatarsal amputation of foot, right ([name]) 06/05/2017   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) 05/19/2017   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[name])    SIRS (systemic inflammatory response syndrome) ([name]) 04/06/2017   Suspected sleep apnea 11/24/2016   Ulcer of toe of right foot, with necrosis of bone ([name]) 10/27/2016   Ulcer of left lower leg ([name]) 05/19/2016   Severe obesity (BMI >= 40) ([name]) 02/24/2016   Morbid obesity ([name]) 02/24/2016   Encounter for therapeutic drug monitoring 02/10/2016   Symptomatic bradycardia 01/12/2016   Hypertension associated with diabetes ([name]) 12/22/2015   Elevated troponin    Chronic combined systolic and diastolic CHF (congestive heart failure) ([name])    Leukocytosis 11/08/2015   Anemia of chronic disease 11/08/2015   Hypokalemia 11/08/2015   Tobacco abuse 10/23/2015   Coronary artery disease    DOE (dyspnea on exertion) 04/29/2015   Diabetes mellitus type 2, uncontrolled 02/08/2015   Influenza A 02/07/2015   Wrist fracture, left, closed, initial encounter 01/29/2015   PAF (paroxysmal atrial fibrillation) ([name]) 01/16/2015   Carotid arterial disease ([name]) 01/16/2015   Claudication ([name]) 01/15/2015    Demand ischemia ([name]) 10/29/2014   Insomnia 02/03/2014   S/P peripheral artery angioplasty - TurboHawk atherectomy; R SFA 09/11/2013    Class: Acute   Leg pain, bilateral 08/19/2013   Hypothyroidism 07/31/2013   Cellulitis 06/13/2013   History of cocaine abuse ([name]) 06/13/2013   Long term current use of anticoagulant therapy 05/20/2013   Alcohol abuse    Narcotic abuse ([name])    Marijuana abuse    Alcoholic cirrhosis ([name])    DM (diabetes mellitus), type 2 with peripheral vascular complications ([name])     Current Outpatient Medications:    ACCU-CHEK GUIDE test strip, USE TO CHECK BLOOD SUGAR FOUR TIMES DAILY, Disp: , Rfl:    acetaminophen (TYLENOL) 500 MG tablet, Take 1,000 mg by mouth every 6 (six) hours as needed for headache (pain)., Disp: , Rfl:    albuterol (VENTOLIN HFA) 108 (90 Base) MCG/ACT inhaler, USE 2 PUFFS BY MOUTH EVERY FOUR HOURS, AS NEEDED, FOR COUGHING/WHEEZING, Disp: 8.5 g, Rfl: 0   apixaban (ELIQUIS) 5 MG TABS tablet, Take 1 tablet (5 mg total) by mouth 2 (two) times daily., Disp: 60 tablet, Rfl: 6   budesonide-formoterol (SYMBICORT) 160-4.5 MCG/ACT inhaler, Inhale 2 puffs into the lungs 2 (two) times daily.,  Disp: 1 each, Rfl: 2   buPROPion ER (WELLBUTRIN SR) 100 MG 12 hr tablet, Take 1 tablet (100 mg total) by mouth daily., Disp: 30 tablet, Rfl: 6   Cholecalciferol (VITAMIN D3 PO), Take 1 capsule by mouth every morning., Disp: , Rfl:    EASY COMFORT PEN NEEDLES 31G X 5 MM MISC, USE FOUR TIMES A DAY FOR INSULIN ADMINISTRATION, Disp: 100 each, Rfl: 1   fluticasone (FLONASE) 50 MCG/ACT nasal spray, Place 1 spray into both nostrils as needed for allergies or rhinitis., Disp: , Rfl:    insulin lispro (HUMALOG) 100 UNIT/ML KwikPen, Inject 10 Units into the skin 3 (three) times daily with meals., Disp: 15 mL, Rfl: 0   LANTUS SOLOSTAR 100 UNIT/ML Solostar Pen, Inject 37 Units into the skin daily., Disp: 15 mL, Rfl: 0   levothyroxine (SYNTHROID) 50 MCG tablet, TAKE 1 TABLET (50  MCG TOTAL) BY MOUTH DAILY BEFORE BREAKFAST (AM), Disp: 30 tablet, Rfl: 0   metolazone (ZAROXOLYN) 2.5 MG tablet, Take 1 tablet (2.5 mg total) by mouth once a week. Every Wednesday (Patient taking differently: Take 2.5 mg by mouth once a week. Took on Tuesday due to being so bloated), Disp: 90 tablet, Rfl: 3   metoprolol succinate (TOPROL-XL) 25 MG 24 hr tablet, Take 1 tablet (25 mg total) by mouth daily., Disp: 30 tablet, Rfl: 0   pantoprazole (PROTONIX) 40 MG tablet, TAKE 1 TABLET (40 MG TOTAL) BY MOUTH DAILY(AM), Disp: 30 tablet, Rfl: 0   potassium chloride SA (KLOR-CON M20) 20 MEQ tablet, Take 2 tablets (40 mEq total) by mouth daily., Disp: 180 tablet, Rfl: 3   rosuvastatin (CRESTOR) 40 MG tablet, TAKE 1 TABLET (40 MG TOTAL) BY MOUTH DAILY (BEDTIME), Disp: 90 tablet, Rfl: 0   torsemide (DEMADEX) 100 MG tablet, Take 1 tablet (100 mg total) by mouth 2 (two) times daily., Disp: 180 tablet, Rfl: 3   VITAMIN E PO, Take 1 tablet by mouth daily., Disp: , Rfl:    colchicine 0.6 MG tablet, Take 1.2 mg by mouth See admin instructions. Take 1.2 mg for flair up and an hour later take 0.6 mg if needed (Patient not taking: Reported on 10/03/2023), Disp: , Rfl:    nystatin cream (MYCOSTATIN), Apply topically 2 (two) times daily., Disp: 30 g, Rfl: 0   polyethylene glycol (MIRALAX / GLYCOLAX) 17 g packet, Take 17 g by mouth daily as needed for moderate constipation., Disp: 14 each, Rfl: 0   triamcinolone ointment (KENALOG) 0.1 %, Apply topically 2 (two) times daily., Disp: 454 g, Rfl: 0 Allergies  Allergen Reactions   Gabapentin Nausea And Vomiting and Other (See Comments)    POSSIBLE SHAKING   Lyrica [Pregabalin] Other (See Comments)    Shaking       Social History   Socioeconomic History   Marital status: Single    Spouse name: Not on file   Number of children: 1   Years of education: 12   Highest education level: 12th grade  Occupational History   Occupation: disabled  Tobacco Use   Smoking  status: Former    Current packs/day: 1.00    Average packs/day: 1 pack/day for 44.0 years (44.0 ttl pk-yrs)    Types: E-cigarettes, Cigarettes   Smokeless tobacco: Former    Types: Snuff  Vaping Use   Vaping status: Former   Devices: 11/26/2018 "stopped months ago"  Substance and Sexual Activity   Alcohol use: Not Currently    Comment: occ   Drug use: Not Currently  Types: "Crack" cocaine, Marijuana, Oxycodone   Sexual activity: Not Currently  Other Topics Concern   Not on file  Social History Narrative   ** Merged History Encounter **       Lives in [place], in motel with sister.  They are looking to move but don't have a place to go yet.     Social Determinants of Health   Financial Resource Strain: Low Risk  (02/09/2023)   Overall Financial Resource Strain (CARDIA)    Difficulty of Paying Living Expenses: Not hard at all  Food Insecurity: No Food Insecurity (09/23/2023)   Hunger Vital Sign    Worried About Running Out of Food in the Last Year: Never true    Ran Out of Food in the Last Year: Never true  Transportation Needs: No Transportation Needs (09/23/2023)   PRAPARE - Administrator, Civil Service (Medical): No    Lack of Transportation (Non-Medical): No  Physical Activity: Inactive (02/09/2023)   Exercise Vital Sign    Days of Exercise per Week: 0 days    Minutes of Exercise per Session: 0 min  Stress: No Stress Concern Present (02/09/2023)   [employer] of Occupational Health - Occupational Stress Questionnaire    Feeling of Stress : Not at all  Social Connections: Unknown (08/21/2023)   Received from [facility]   Social Network    Social Network: Not on file  Intimate Partner Violence: Not At Risk (09/23/2023)   Humiliation, Afraid, Rape, and Kick questionnaire    Fear of Current or Ex-Partner: No    Emotionally Abused: No    Physically Abused: No    Sexually Abused: No    Physical Exam      Future Appointments  Date Time Provider  Department Center  10/10/2023 11:00 AM Leonie Douglas, MD [facility]  10/11/2023  1:30 PM Allen Derry [place] III, PA-C [facility]  10/18/2023 12:00 PM [facility] PA/NP [facility] None  10/19/2023  2:30 PM Maxwell Caul, MD [facility]  10/26/2023 12:30 PM Maxwell Caul, MD [facility]  11/02/2023 12:30 PM Maxwell Caul, MD [facility]  02/12/2024  4:00 PM [facility] VISIT [facility] None       Kerry Hough, Paramedic [phone] [facility] Paramedic  10/10/23

## 2023-10-10 NOTE — ED Triage Notes (Signed)
Patient reports having fluid overload and hyperglycemia.  Patient was seen at [facility] 3 days ago and reports she was discharged.  Reports this has been going on for several weeks.  [facility] RN came and visited and cbg >500 and told her to come to ER.  Patient has wounds to bilateral lower legs.  Patient also complains of hacking cough.

## 2023-10-10 NOTE — ED Notes (Signed)
Assisted patient onto bedside commode, attempted to obtain urine specimen but patient had a BM as well.

## 2023-10-10 NOTE — ED Notes (Signed)
ED TO INPATIENT HANDOFF REPORT  ED Nurse Name and Phone #: Donyetta Ogletree [phone]  S Name/Age/Gender [name] 62 y.o. female Room/Bed: 039C/039C  Code Status   Code Status: Full Code  Home/SNF/Other Home Patient oriented to: self, place, time, and situation Is this baseline? Yes   Triage Complete: Triage complete  Chief Complaint 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) and diastolic (congestive) heart failure ([name]) [I50.43]  Triage Note Patient reports having fluid overload and hyperglycemia.  Patient was seen at [facility] 3 days ago and reports she was discharged.  Reports this has been going on for several weeks.  HH RN came and visited and cbg >500 and told her to come to ER.  Patient has wounds to bilateral lower legs.  Patient also complains of hacking cough.    Allergies Allergies  Allergen Reactions   Gabapentin Nausea And Vomiting and Other (See Comments)    POSSIBLE SHAKING   Lyrica [Pregabalin] Other (See Comments)    Shaking     Level of Care/Admitting Diagnosis ED Disposition     ED Disposition  Admit   Condition  --   Comment  Hospital Area: [facility] [[id number]]  Level of Care: Progressive [102]  Admit to Progressive based on following criteria: CARDIOVASCULAR & THORACIC of moderate stability with acute coronary syndrome symptoms/low risk myocardial infarction/hypertensive urgency/arrhythmias/heart failure potentially compromising stability and stable post cardiovascular intervention patients.  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 11/10/2014 Cath: LM nl, LAD min irregs, D1 30 ost, D2 50d, LCX 63m, OM1 80 p/m (1.5 mm vessel), OM2 43m, RCA nondom 72m-->med rx.. Demand ischemia in the setting of rapid a-fib.   Cardiomyopathy ([name])    Carotid artery disease ([name])    a. 01/2015 Carotid Angio: RICA 100, LICA 95p; b. 02/2015 s/p L CEA; c. 05/2019 Carotid U/S: RICA 100. RECA >50. LICA 1-39%.   Cellulitis    lower extremities   Cellulitis in diabetic foot ([name]) 07/08/2019   CHF (congestive heart failure) ([name])    Chronic combined systolic and diastolic CHF (congestive heart failure) ([name])    a. 10/2014 Echo: EF 40-45%; b. 10/2018 Echo: EF 45-50%, gr2 DD; c. 11/2019 Echo: EF 50%, mild LVH, gr2 DD (restrictive), antlat HK, Nl RV fxn. Mild BAE. RVSP 59.61mmHg.   CKD (chronic kidney disease), stage III ([name])    Cocaine abuse ([name])    COPD (chronic obstructive pulmonary disease) ([name])    Critical ischemia of foot ([name]) 06/07/2021   Critical limb ischemia     Critical lower limb ischemia ([name])    Demand ischemia ([name]) 10/29/2014   Depression    Diabetes mellitus without complication ([name])    Diabetic peripheral neuropathy ([name])    DVT (  deep venous thrombosis) ([name])    Dyspnea    Elevated troponin    a. Chronic elevation.   Fall 05/05/2021   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[name])    Peripheral arterial disease/critical limb ischemia     GERD (gastroesophageal reflux disease)    Hyperlipemia    Hypertension    Hypokalemia    Hypomagnesemia    Hypothyroidism    Marijuana abuse    Narcotic abuse ([name])    Noncompliance    NSVT (nonsustained ventricular tachycardia) ([name])    Obesity    Osteomyelitis ([name]) 06/21/2019   PAF  (paroxysmal atrial fibrillation) ([name])    Paroxysmal atrial tachycardia ([name])    Peripheral arterial disease ([name])    a. 01/2015 Angio/PTA: RSFA 99 (atherectomy/pta) - 1 vessel runoff via diff dzs peroneal; b. 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   Pneumonia    "once or twice" (11/26/2018)   Poorly controlled type 2 diabetes mellitus ([name])    Renal disorder    Renal insufficiency    a. 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   SIRS (systemic inflammatory response syndrome) ([name]) 04/06/2017   Sleep apnea    "couldn't handle wearing the mask" (11/26/2018)   Symptomatic bradycardia    a. Avoid AV blocking agent per EP. Prev req temp wire in 2017.   Tobacco abuse    Wrist fracture, left, closed, initial encounter 01/29/2015   Past Surgical History:  Procedure Laterality Date   ABDOMINAL AORTOGRAM N/A 06/26/2019   Procedure: ABDOMINAL AORTOGRAM;  Surgeon: Iran Ouch, MD;  [facility]: [facility]VAS[facility] CV LAB;  Service: Cardiovascular;  Laterality: N/A;   ABDOMINAL AORTOGRAM W/LOWER EXTREMITY N/A 06/07/2021   Procedure: ABDOMINAL AORTOGRAM W/LOWER EXTREMITY;  Surgeon: Runell Gess, MD;  Location: [facility];  Service: Cardiovascular;  Laterality: N/A;   ABDOMINAL AORTOGRAM W/LOWER EXTREMITY N/A 08/11/2023   Procedure: ABDOMINAL AORTOGRAM W/LOWER EXTREMITY;  Surgeon: Leonie Douglas, MD;  Location: [facility];  Service: Cardiovascular;  Laterality: N/A;   AMPUTATION Right 06/14/2017   Procedure: Right foot transmetatarsal amputation;  Surgeon: Nadara Mustard, MD;  Location: [facility] OR;  Service: Orthopedics;  Laterality: Right;   AMPUTATION Left 06/28/2019   Procedure: AMPUTATION LEFT FIFTH TOE;  Surgeon: Larina Earthly, MD;  Location: [facility] OR;  Service: Vascular;  Laterality: Left;   AMPUTATION TOE Right 04/28/2017   Procedure: AMPUTATION OF RIGHT SECOND RAY;  Surgeon: Nadara Mustard, MD;  Location: [facility] OR;  Service: Orthopedics;  Laterality: Right;   CARDIAC CATHETERIZATION      CARDIAC CATHETERIZATION N/A 01/12/2016   Procedure: Temporary Wire;  Surgeon: Rollene Rotunda, MD;  Location: [facility];  Service: Cardiovascular;  Laterality: N/A;   CARDIOVERSION  ~ 02/2013   "twice"    CARDIOVERSION N/A 09/26/2023   Procedure: CARDIOVERSION (CATH LAB);  Surgeon: Maisie Fus, MD;  Location: [facility] INVASIVE CV LAB;  Service: Cardiovascular;  Laterality: N/A;   CAROTID ANGIOGRAM N/A 01/15/2015   Procedure: CAROTID ANGIOGRAM;  Surgeon: Runell Gess, MD;  Location: [facility] CATH LAB;  Service: Cardiovascular;  Laterality: N/A;   DILATION AND CURETTAGE OF UTERUS  1988   ENDARTERECTOMY Left 02/19/2015   Procedure: LEFT CAROTID ENDARTERECTOMY ;  Surgeon: Nada Libman, MD;  Location: [facility] OR;  Service: Vascular;  Laterality: Left;   FEMORAL-TIBIAL BYPASS GRAFT Left 06/28/2019   Procedure: BYPASS GRAFT LEFT LEG FEMORAL TO ANTERIOR TIBIAL ARTERY using LEFT GREATER SAPHENOUS VEIN;  Surgeon: Arbie Cookey,  Kristen Loader, MD;  Location: [facility] OR;  Service: Vascular;  Laterality: Left;   LEFT HEART CATHETERIZATION WITH CORONARY ANGIOGRAM N/A 10/31/2014   Procedure: LEFT HEART CATHETERIZATION WITH CORONARY ANGIOGRAM;  Surgeon: Kathleene Hazel, MD;  Location: [facility] CATH LAB;  Service: Cardiovascular;  Laterality: N/A;   LOWER EXTREMITY ANGIOGRAM N/A 09/10/2013   Procedure: LOWER EXTREMITY ANGIOGRAM;  Surgeon: Runell Gess, MD;  Location: [facility] CATH LAB;  Service: Cardiovascular;  Laterality: N/A;   LOWER EXTREMITY ANGIOGRAM N/A 01/15/2015   Procedure: LOWER EXTREMITY ANGIOGRAM;  Surgeon: Runell Gess, MD;  Location: [facility] CATH LAB;  Service: Cardiovascular;  Laterality: N/A;   LOWER EXTREMITY ANGIOGRAPHY N/A 04/13/2017   Procedure: Lower Extremity Angiography;  Surgeon: Runell Gess, MD;  Location: [facility] INVASIVE CV LAB;  Service: Cardiovascular;  Laterality: N/A;   LOWER EXTREMITY ANGIOGRAPHY Left 06/26/2019   Procedure: LOWER EXTREMITY ANGIOGRAPHY;  Surgeon: Iran Ouch, MD;  [facility];  Service: Cardiovascular;  Laterality: Left;   PERIPHERAL VASCULAR ATHERECTOMY Right 06/07/2021   Procedure: PERIPHERAL VASCULAR ATHERECTOMY;  Surgeon: Runell Gess, MD;  Location: [facility] INVASIVE CV LAB;  Service: Cardiovascular;  Laterality: Right;   PERIPHERAL VASCULAR BALLOON ANGIOPLASTY Left 06/26/2019   Procedure: PERIPHERAL VASCULAR BALLOON ANGIOPLASTY;  Surgeon: Iran Ouch, MD;  [facility];  Service: Cardiovascular;  Laterality: Left;  unable to cross lt sfa occlusion   PERIPHERAL VASCULAR INTERVENTION  04/13/2017   Procedure: Peripheral Vascular Intervention;  Surgeon: Runell Gess, MD;  Location: [facility] INVASIVE CV LAB;  Service: Cardiovascular;;   PERIPHERAL VASCULAR INTERVENTION  08/11/2023   Procedure: PERIPHERAL VASCULAR INTERVENTION;  Surgeon: Leonie Douglas, MD;  Location: [facility];  Service: Cardiovascular;;   PRESSURE SENSOR/CARDIOMEMS N/A 02/05/2020   Procedure: PRESSURE SENSOR/CARDIOMEMS;  Surgeon: Laurey Morale, MD;  Location: [facility] INVASIVE CV LAB;  Service: Cardiovascular;  Laterality: N/A;   VEIN HARVEST Left 06/28/2019   Procedure: LEFT LEG GREATER SAPHENOUS VEIN HARVEST;  Surgeon: Larina Earthly, MD;  Location: [facility] OR;  Service: Vascular;  Laterality: Left;     A IV Location/Drains/Wounds Patient Lines/Drains/Airways Status     Active Line/Drains/Airways     Name Placement date Placement time Site Days   Peripheral IV 10/10/23 22 G Right Hand 10/10/23  1324  Hand  less than 1   External Urinary Catheter 10/10/23  1400  --  less than 1   Wound / Incision (Open or Dehisced) 04/18/22 Irritant Dermatitis (Moisture Associated Skin Damage) Abdomen Right;Left RED MASD to skin folds abdomen/groin 04/18/22  1855  Abdomen  540   Wound / Incision (Open or Dehisced) 04/18/22 Breast Bilateral MASD under bilateral breast 04/18/22  1855  Breast  540   Wound / Incision (Open or Dehisced) 07/27/23  Heel Right 07/27/23  1900  Heel  75   Wound  / Incision (Open or Dehisced) 07/27/23 Pretibial Right 07/27/23  1900  Pretibial  75   Wound / Incision (Open or Dehisced) 09/23/23 Diabetic ulcer Tibial Posterior;Right Small round open wound 09/23/23  1700  Tibial  17   Wound / Incision (Open or Dehisced) 09/23/23 Diabetic ulcer Tibial Posterior;Right Small round open wound 09/23/23  1700  Tibial  17   Wound / Incision (Open or Dehisced) 09/23/23 Diabetic ulcer Tibial Distal;Posterior;Right small round open wound 09/23/23  1700  Tibial  17   Wound / Incision (Open or Dehisced) 09/23/23 Diabetic ulcer Foot Left Wound to Left exterior foot 09/23/23  1700  Foot  17   Wound / Incision (Open or Dehisced) 09/23/23 Irritant Dermatitis (Moisture Associated Skin Damage) Hip Anterior;Bilateral Moisture  Associated skin damage 09/23/23  1700  Hip  17            Intake/Output Last 24 hours No intake or output data in the 24 hours ending 10/10/23 1443  Labs/Imaging Results for orders placed or performed during the hospital encounter of 10/10/23 (from the past 48 hour(s))  CBG monitoring, ED     Status: Abnormal   Collection Time: 10/10/23 11:55 AM  Result Value Ref Range   Glucose-Capillary 316 (H) 70 - 99 mg/dL    Comment: Glucose reference range applies only to samples taken after fasting for at least 8 hours.  Basic metabolic panel     Status: Abnormal   Collection Time: 10/10/23 12:02 PM  Result Value Ref Range   Sodium 139 135 - 145 mmol/L   Potassium 4.8 3.5 - 5.1 mmol/L   Chloride 102 98 - 111 mmol/L   CO2 26 22 - 32 mmol/L   Glucose, Bld 335 (H) 70 - 99 mg/dL    Comment: Glucose reference range applies only to samples taken after fasting for at least 8 hours.   BUN 54 (H) 6 - 20 mg/dL   Creatinine, Ser 4.09 (H) 0.44 - 1.00 mg/dL   Calcium 9.0 8.9 - 81.1 mg/dL   GFR, Estimated 41 (L) >60 mL/min    Comment: (NOTE) Calculated using the CKD-EPI Creatinine Equation (2021)    Anion gap 11 5 - 15    Comment: Performed at [facility], [address], [place], [place] [address]  Brain natriuretic peptide     Status: Abnormal   Collection Time: 10/10/23 12:02 PM  Result Value Ref Range   B Natriuretic Peptide 1,190.2 (H) 0.0 - 100.0 pg/mL    Comment: Performed at [facility], [address], [place], [place] [address]  I-stat chem 8, ed     Status: Abnormal   Collection Time: 10/10/23 12:12 PM  Result Value Ref Range   Sodium 138 135 - 145 mmol/L   Potassium 4.7 3.5 - 5.1 mmol/L   Chloride 103 98 - 111 mmol/L   BUN 53 (H) 6 - 20 mg/dL   Creatinine, Ser 1.30 (H) 0.44 - 1.00 mg/dL   Glucose, Bld 865 (H) 70 - 99 mg/dL    Comment: Glucose reference range applies only to samples taken after fasting for at least 8 hours.   Calcium, Ion 1.10 (L) 1.15 - 1.40 mmol/L   TCO2 27 22 - 32 mmol/L   Hemoglobin 9.2 (L) 12.0 - 15.0 g/dL   HCT 78.4 (L) 69.6 - 29.5 %  Troponin I (High Sensitivity)     Status: Abnormal   Collection Time: 10/10/23 12:37 PM  Result Value Ref Range   Troponin I (High Sensitivity) 24 (H) <18 ng/L    Comment: (NOTE) Elevated high sensitivity troponin I (hsTnI) values and significant  changes across serial measurements may suggest ACS but many other  chronic and acute conditions are known to elevate hsTnI results.  Refer to the "Links" section for chest pain algorithms and additional  guidance. Performed at [facility], [address] [place], [place] [address]   Magnesium     Status: None   Collection Time: 10/10/23 12:37 PM  Result Value Ref Range   Magnesium 2.0 1.7 - 2.4 mg/dL    Comment: Performed at [facility], [address], [place], [place] [address]  Resp panel by RT-PCR (RSV, Flu A&B, Covid) Anterior Nasal Swab     Status: None   Collection Time: 10/10/23 12:53 PM   Specimen: Anterior Nasal Swab  Result Value Ref Range   SARS Coronavirus 2 by RT PCR NEGATIVE NEGATIVE   Influenza A by PCR NEGATIVE NEGATIVE   Influenza B by PCR NEGATIVE NEGATIVE    Comment:  (NOTE) The Xpert Xpress SARS-CoV-2/FLU/RSV plus assay is intended as an aid in the diagnosis of influenza from Nasopharyngeal swab specimens and should not be used as a sole basis for treatment. Nasal washings and aspirates are unacceptable for Xpert Xpress SARS-CoV-2/FLU/RSV testing.  Fact Sheet for Patients: BloggerCourse.com  Fact Sheet for Healthcare Providers: SeriousBroker.it  This test is not yet approved or cleared by the [place] FDA and has been authorized for detection and/or diagnosis of SARS-CoV-2 by FDA under an Emergency Use Authorization (EUA). This EUA will remain in effect (meaning this test can be used) for the duration of the COVID-19 declaration under Section 564(b)(1) of the Act, 21 U.S.C. section 360bbb-3(b)(1), unless the authorization is terminated or revoked.     Resp Syncytial Virus by PCR NEGATIVE NEGATIVE    Comment: (NOTE) Fact Sheet for Patients: BloggerCourse.com  Fact Sheet for Healthcare Providers: SeriousBroker.it  This test is not yet approved or cleared by the [place] FDA and has been authorized for detection and/or diagnosis of SARS-CoV-2 by FDA under an Emergency Use Authorization (EUA). This EUA will remain in effect (meaning this test can be used) for the duration of the COVID-19 declaration under Section 564(b)(1) of the Act, 21 U.S.C. section 360bbb-3(b)(1), unless the authorization is terminated or revoked.  Performed at [facility], [address] [place], [place] [address]   CBG monitoring, ED     Status: Abnormal   Collection Time: 10/10/23  1:20 PM  Result Value Ref Range   Glucose-Capillary 308 (H) 70 - 99 mg/dL    Comment: Glucose reference range applies only to samples taken after fasting for at least 8 hours.   *Note: Due to a large number of results and/or encounters for the requested time period, some  results have not been displayed. A complete set of results can be found in Results Review.   DG Chest 2 View  Result Date: 10/10/2023 CLINICAL DATA:  Cough. EXAM: CHEST - 2 VIEW COMPARISON:  Chest radiograph from 10/07/2023 and chest CT scan from 06/16/2023. FINDINGS: Bilateral lung fields are clear. Bilateral costophrenic angles are clear. Stable cardio-mediastinal silhouette. No acute osseous abnormalities. The soft tissues are within normal limits. There is an approximately 1 cm long metallic linear structure overlying the left posterior mediastinum, which corresponds to foreign body in the left lung lower lobe pulmonary vessel when compared to the prior CT scan from 06/16/2023. IMPRESSION: *No active cardiopulmonary disease. *Metallic foreign body in the left lung lower lobe pulmonary vessel, when correlated with prior chest CT scan. Electronically Signed   By: Jules Schick M.D.   On: 10/10/2023 14:03    Pending Labs Unresulted Labs (From admission, onward)     Start     Ordered   10/11/23 0500  Comprehensive metabolic panel  Tomorrow morning,   R        10/10/23 1439   10/11/23 0500  CBC  Tomorrow morning,   R        10/10/23 1439   10/10/23 1330  CBC with Differential  Once,   STAT        10/10/23 1329   10/10/23 1244  TSH  Once,   URGENT        10/10/23 1243   10/10/23 1200  Urinalysis, Routine w reflex microscopic -Urine, Clean Catch  Once,   URGENT       Question:  Specimen Source  Answer:  Urine, Clean Catch   10/10/23 1159            Vitals/Pain Today's Vitals   10/10/23 1153 10/10/23 1156 10/10/23 1157  BP: (!) 140/45    Pulse: 71    Resp: 18    Temp: 98.3 F (36.8 C)    SpO2: 100%    Weight:   107.5 kg  Height:   5\' 4"  (1.626 m)  PainSc:  9      Isolation Precautions No active isolations  Medications Medications  metoprolol succinate (TOPROL-XL) 24 hr tablet 25 mg (has no administration in time range)  rosuvastatin (CRESTOR) tablet 40 mg (has no  administration in time range)  buPROPion ER (WELLBUTRIN SR) 12 hr tablet 100 mg (has no administration in time range)  pantoprazole (PROTONIX) EC tablet 40 mg (has no administration in time range)  levothyroxine (SYNTHROID) tablet 50 mcg (has no administration in time range)  apixaban (ELIQUIS) tablet 5 mg (has no administration in time range)  mometasone-formoterol (DULERA) 200-5 MCG/ACT inhaler 2 puff (has no administration in time range)  albuterol (VENTOLIN HFA) 108 (90 Base) MCG/ACT inhaler 1-2 puff (has no administration in time range)  furosemide (LASIX) 120 mg in dextrose 5 % 50 mL IVPB (has no administration in time range)  sodium chloride flush (NS) 0.9 % injection 3 mL (has no administration in time range)  acetaminophen (TYLENOL) tablet 650 mg (has no administration in time range)    Or  acetaminophen (TYLENOL) suppository 650 mg (has no administration in time range)  polyethylene glycol (MIRALAX / GLYCOLAX) packet 17 g (has no administration in time range)  furosemide (LASIX) injection 40 mg (40 mg Intravenous Given 10/10/23 1325)  insulin aspart (novoLOG) injection 6 Units (6 Units Intravenous Given 10/10/23 1325)  ipratropium-albuterol (DUONEB) 0.5-2.5 (3) MG/3ML nebulizer solution 3 mL (3 mLs Nebulization Given 10/10/23 1329)    Mobility walks     Focused Assessments Cardiac Assessment Handoff:    Lab Results  Component Value Date   TROPONINI 0.06 (HH) 04/06/2017   Lab Results  Component Value Date   DDIMER 0.48 10/29/2014   Does the Patient currently have chest pain? No    R Recommendations: See Admitting Provider Note  Report given to:   Additional Notes:

## 2023-10-10 NOTE — ED Provider Notes (Signed)
[place] [facility]GENCY DEPARTMENT AT [facility] Provider Note   CSN: [id number] Arrival date & time: 10/10/23  1148     History  Chief Complaint  Patient presents with   Hyperglycemia   Congestive Heart Failure    [name] is a 62 y.o. female.  cxrPt is a 62 yo female with pmhx significant for polysubstance abuse, cardiomyopathy, CHF (last echo on 11/13 with EF 45-50%), hypothyroidism, pAF (on Eliquis), GERD, DM2, arthritis, gout, ckd, htn, copd, and critical limb ischemia s/p femoropopliteal angioplasty and stenting on 9/27.  [name] was in the ED on 11/23 for elevated BS.  [name] was given IV lasix while there, but said [name] did not diurese much.  [name] was seen this am by home paramedic and bs was >500.  [name] said [name] did take her am meds.  [name] had an appt with Dr. Lenell Antu (vascular) today for evaluation of her right foot wound.  He noted that [name] was symptomatic from dyspnea and LE swelling and precommended that [name] come to the ED.  Pt denies f/c.  No cp.         Home Medications Prior to Admission medications   Medication Sig Start Date End Date Taking? Authorizing Provider  ACCU-CHEK GUIDE test strip USE TO CHECK BLOOD SUGAR FOUR TIMES DAILY 02/25/20   [provider]  acetaminophen (TYLENOL) 500 MG tablet Take 1,000 mg by mouth every 6 (six) hours as needed for headache (pain).    [provider]  albuterol (VENTOLIN HFA) 108 (90 Base) MCG/ACT inhaler USE 2 PUFFS BY MOUTH EVERY FOUR HOURS, AS NEEDED, FOR COUGHING/WHEEZING 07/12/23   [name], MD  apixaban (ELIQUIS) 5 MG TABS tablet Take 1 tablet (5 mg total) by mouth 2 (two) times daily. 07/05/23   Clegg, [name] D, NP  budesonide-formoterol (SYMBICORT) 160-4.5 MCG/ACT inhaler Inhale 2 puffs into the lungs 2 (two) times daily. 07/12/23   [name], MD  buPROPion ER (WELLBUTRIN SR) 100 MG 12 hr tablet Take 1 tablet (100 mg total) by mouth daily. 07/05/23   Clegg, [name] D, NP  Cholecalciferol (VITAMIN D3  PO) Take 1 capsule by mouth every morning.    [provider]  colchicine 0.6 MG tablet Take 1.2 mg by mouth See admin instructions. Take 1.2 mg for flair up and an hour later take 0.6 mg if needed    [provider]  EASY COMFORT PEN NEEDLES 31G X 5 MM MISC USE FOUR TIMES A DAY FOR INSULIN ADMINISTRATION 08/30/22   Georgian Co M, PA-C  fluticasone (FLONASE) 50 MCG/ACT nasal spray Place 1 spray into both nostrils as needed for allergies or rhinitis.    [provider]  insulin lispro (HUMALOG) 100 UNIT/ML KwikPen Inject 10 Units into the skin 3 (three) times daily with meals. 04/05/23   [name], MD  LANTUS SOLOSTAR 100 UNIT/ML Solostar Pen Inject 37 Units into the skin daily. 04/05/23   [name], MD  levothyroxine (SYNTHROID) 50 MCG tablet TAKE 1 TABLET (50 MCG TOTAL) BY MOUTH DAILY BEFORE BREAKFAST (AM) 05/02/23   [name], MD  metolazone (ZAROXOLYN) 2.5 MG tablet Take 1 tablet (2.5 mg total) by mouth once a week. Every Wednesday Patient taking differently: Take 2.5 mg by mouth once a week. Took on Tuesday due to being so bloated 10/03/23 01/01/24  Laurey Morale, MD  metoprolol succinate (TOPROL-XL) 25 MG 24 hr tablet Take 1 tablet (25 mg total) by mouth daily. 09/28/23 10/28/23  Jerald Kief,  MD  nystatin cream (MYCOSTATIN) Apply topically 2 (two) times daily. 08/05/23   Kathlen Mody, MD  pantoprazole (PROTONIX) 40 MG tablet TAKE 1 TABLET (40 MG TOTAL) BY MOUTH DAILY(AM) 05/02/23   [name], MD  polyethylene glycol (MIRALAX / GLYCOLAX) 17 g packet Take 17 g by mouth daily as needed for moderate constipation. 08/05/23   Kathlen Mody, MD  potassium chloride SA (KLOR-CON M20) 20 MEQ tablet Take 2 tablets (40 mEq total) by mouth daily. 10/03/23 01/01/24  Laurey Morale, MD  rosuvastatin (CRESTOR) 40 MG tablet TAKE 1 TABLET (40 MG TOTAL) BY MOUTH DAILY (BEDTIME) 08/31/23   Cannon Kettle, PA-C  torsemide (DEMADEX) 100 MG tablet Take 1  tablet (100 mg total) by mouth 2 (two) times daily. 10/03/23   Laurey Morale, MD  triamcinolone ointment (KENALOG) 0.1 % Apply topically 2 (two) times daily. 08/09/23   [name], MD  VITAMIN E PO Take 1 tablet by mouth daily.    [provider]  tiotropium (SPIRIVA HANDIHALER) 18 MCG inhalation capsule Place 1 capsule (18 mcg total) into inhaler and inhale every morning. Patient not taking: Reported on 02/09/2021 12/09/19 02/09/21  [name], MD      Allergies    Gabapentin and Lyrica [pregabalin]    Review of Systems   Review of Systems  Respiratory:  Positive for shortness of breath.   Cardiovascular:  Positive for leg swelling.  All other systems reviewed and are negative.   Physical Exam Updated Vital Signs BP (!) 140/45 (BP Location: Right Arm)   Pulse 72   Temp 98.3 F (36.8 C)   Resp (!) 21   Ht 5\' 4"  (1.626 m)   Wt 107.5 kg   LMP  (LMP Unknown)   SpO2 100%   BMI 40.68 kg/m  Physical Exam Vitals and nursing note reviewed.  Constitutional:      Appearance: Normal appearance. [name] is obese.  HENT:     Head: Normocephalic and atraumatic.     Right Ear: External ear normal.     Left Ear: External ear normal.     Nose: Nose normal.     Mouth/Throat:     Mouth: Mucous membranes are moist.     Pharynx: Oropharynx is clear.  Eyes:     Extraocular Movements: Extraocular movements intact.     Conjunctiva/sclera: Conjunctivae normal.     Pupils: Pupils are equal, round, and reactive to light.  Cardiovascular:     Rate and Rhythm: Normal rate and regular rhythm.     Pulses: Normal pulses.     Heart sounds: Normal heart sounds.  Pulmonary:     Breath sounds: Wheezing and rhonchi present.  Abdominal:     General: Abdomen is flat. Bowel sounds are normal.     Palpations: Abdomen is soft.  Musculoskeletal:     Cervical back: Normal range of motion and neck supple.     Right lower leg: Edema present.     Left lower leg: Edema present.      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  Neurological:     General: No focal deficit present.     Mental Status: [name] is alert and oriented to person, place, and time.  Psychiatric:        Mood and Affect: Mood normal.  Behavior: Behavior normal.     ED Results / Procedures / Treatments   Labs (all labs ordered are listed, but only abnormal results are displayed) Labs Reviewed  BASIC METABOLIC PANEL - Abnormal; Notable for the following components:      Result Value   Glucose, Bld 335 (*)    BUN 54 (*)    Creatinine, Ser 1.45 (*)    GFR, Estimated 41 (*)    All other components within normal limits  BRAIN NATRIURETIC PEPTIDE - Abnormal; Notable for the following components:   B Natriuretic Peptide 1,190.2 (*)    All other components within normal limits  CBC WITH DIFFERENTIAL/PLATELET - Abnormal; Notable for the following components:   WBC 10.8 (*)    RBC 3.15 (*)    Hemoglobin 8.7 (*)    HCT 29.2 (*)    MCHC 29.8 (*)    Neutro Abs 8.7 (*)    All other components within normal limits  CBG MONITORING, ED - Abnormal; Notable for the following components:   Glucose-Capillary 316 (*)    All other components within normal limits  CBG MONITORING, ED - Abnormal; Notable for the following components:   Glucose-Capillary 308 (*)    All other components within normal limits  I-STAT CHEM 8, ED - Abnormal; Notable for the following components:   BUN 53 (*)    Creatinine, Ser 1.40 (*)    Glucose, Bld 330 (*)    Calcium, Ion 1.10 (*)    Hemoglobin 9.2 (*)    HCT 27.0 (*)    All other components within normal limits  TROPONIN I (HIGH SENSITIVITY) - Abnormal; Notable for the following components:   Troponin I (High Sensitivity) 24 (*)    All other components within normal limits  RESP PANEL BY RT-PCR (RSV, FLU A&B, COVID)  [id number]   MAGNESIUM  URINALYSIS, ROUTINE W REFLEX MICROSCOPIC  TSH  HEPATIC FUNCTION PANEL  CBG MONITORING, ED  TROPONIN I (HIGH SENSITIVITY)    EKG None  Radiology DG Chest 2 View  Result Date: 10/10/2023 CLINICAL DATA:  Cough. EXAM: CHEST - 2 VIEW COMPARISON:  Chest radiograph from 10/07/2023 and chest CT scan from 06/16/2023. FINDINGS: Bilateral lung fields are clear. Bilateral costophrenic angles are clear. Stable cardio-mediastinal silhouette. No acute osseous abnormalities. The soft tissues are within normal limits. There is an approximately 1 cm long metallic linear structure overlying the left posterior mediastinum, which corresponds to foreign body in the left lung lower lobe pulmonary vessel when compared to the prior CT scan from 06/16/2023. IMPRESSION: *No active cardiopulmonary disease. *Metallic foreign body in the left lung lower lobe pulmonary vessel, when correlated with prior chest CT scan. Electronically Signed   By: Jules Schick M.D.   On: 10/10/2023 14:03    Procedures Procedures    Medications Ordered in ED Medications  metoprolol succinate (TOPROL-XL) 24 hr tablet 25 mg (has no administration in time range)  rosuvastatin (CRESTOR) tablet 40 mg (has no administration in time range)  buPROPion ER (WELLBUTRIN SR) 12 hr tablet 100 mg (has no administration in time range)  pantoprazole (PROTONIX) EC tablet 40 mg (has no administration in time range)  levothyroxine (SYNTHROID) tablet 50 mcg (has no administration in time range)  apixaban (ELIQUIS) tablet 5 mg (has no administration in time range)  mometasone-formoterol (DULERA) 200-5 MCG/ACT inhaler 2 puff (has no administration in time range)  furosemide (LASIX) 120 mg in dextrose 5 % 50 mL IVPB (has no administration in time range)  sodium  chloride flush (NS) 0.9 % injection 3 mL (has no administration in time range)  acetaminophen (TYLENOL) tablet 650 mg (has no administration in time range)    Or  acetaminophen  (TYLENOL) suppository 650 mg (has no administration in time range)  polyethylene glycol (MIRALAX / GLYCOLAX) packet 17 g (has no administration in time range)  albuterol (PROVENTIL) (2.5 MG/3ML) 0.083% nebulizer solution 2.5 mg (has no administration in time range)  insulin glargine-yfgn (SEMGLEE) injection 30 Units (has no administration in time range)  insulin aspart (novoLOG) injection 0-20 Units (has no administration in time range)  insulin aspart (novoLOG) injection 0-5 Units (has no administration in time range)  insulin aspart (novoLOG) injection 8 Units (has no administration in time range)  furosemide (LASIX) injection 40 mg (40 mg Intravenous Given 10/10/23 1325)  insulin aspart (novoLOG) injection 6 Units (6 Units Intravenous Given 10/10/23 1325)  ipratropium-albuterol (DUONEB) 0.5-2.5 (3) MG/3ML nebulizer solution 3 mL (3 mLs Nebulization Given 10/10/23 1329)    ED Course/ Medical Decision Making/ A&P                                 Medical Decision Making Amount and/or Complexity of Data Reviewed Labs: ordered. Radiology: ordered.  Risk Prescription drug management. Decision regarding hospitalization.   This patient presents to the ED for concern of sob, this involves an extensive number of treatment options, and is a complaint that carries with it a high risk of complications and morbidity.  The differential diagnosis includes chf, afib, pna, covid/flu/rsv, copd   Co morbidities that complicate the patient evaluation   polysubstance abuse, cardiomyopathy, CHF (last echo on 11/13 with EF 45-50%), hypothyroidism, pAF (on Eliquis), GERD, DM2, arthritis, gout, ckd, htn, copd, and critical limb ischemia s/p femoropopliteal angioplasty and stenting on 9/27   Additional history obtained:  Additional history obtained from epic chart review  Lab Tests:  I Ordered, and personally interpreted labs.  The pertinent results include:  cbc with hgb 8.7 (hgb 9.2 in November), mg  2.0, trop 24, bmp with glucose elevated at 335, bun 54 and cr 1.45 (stable); trop 24 (stable)   Imaging Studies ordered:  I ordered imaging studies including cxr  I independently visualized and interpreted imaging which showed  *No active cardiopulmonary disease.  *Metallic foreign body in the left lung lower lobe pulmonary vessel,  when correlated with prior chest CT scan.   I agree with the radiologist interpretation   Cardiac Monitoring:  The patient was maintained on a cardiac monitor.  I personally viewed and interpreted the cardiac monitored which showed an underlying rhythm of: nsr   Medicines ordered and prescription drug management:  I ordered medication including insulin  for hyperglycemia; lasix for chf  Reevaluation of the patient after these medicines showed that the patient improved I have reviewed the patients home medicines and have made adjustments as needed  Critical Interventions:  lasix   Consultations Obtained:  I requested consultation with the hospitalist (Dr. Alinda Money),  and discussed lab and imaging findings as well as pertinent plan - he will admit   Problem List / ED Course:  CHF:  pt is visibly sob with talking.  O2 sat is ok.  Pt given iv lasix and has not diuresed very much.  As [name] is so symptomatic, [name] will need admission. Anemia: chronic; likely due to ckd Ckd: stable Hyperglycemia:  iv insulin given   Reevaluation:  After the interventions  noted above, I reevaluated the patient and found that they have :improved   Social Determinants of Health:  Lives at home   Dispostion:  After consideration of the diagnostic results and the patients response to treatment, I feel that the patent would benefit from admission.          Final Clinical Impression(s) / ED Diagnoses Final diagnoses:  Peripheral edema  Acute on chronic congestive heart failure, unspecified heart failure type ([name])  Stage 3b chronic kidney disease ([name])   Anemia due to stage 3b chronic kidney disease ([name])  Poorly controlled type 2 diabetes mellitus ([name])    Rx / DC Orders ED Discharge Orders     None         Jacalyn Lefevre, MD 10/10/23 1535

## 2023-10-10 NOTE — Progress Notes (Signed)
Pt arrived to the unit from ED. AAOx4, VSS on RA. Tele- SR. Upon 2 RN skin assessment several open and intact blisters noted on pts BLE. Moisture associated dermatitis pts pannus and under her breast.   Wounds noted  R lower proximal shin 1 x2 cm R plantar 1x 1.6 x 0.2 cm R shin 2 x 2cm L upper lateral tibia 2 x 2.5cm L lower lateral tibia 3 x 2.1cm L hand 1 x 2cm  All wounds washed with vashe, xeroform, gauze, and kerlex

## 2023-10-10 NOTE — ED Notes (Signed)
On phone with lab, Lab will add on CBC.

## 2023-10-10 NOTE — Telephone Encounter (Signed)
Unable to reach pt to iron out furoscix  Noted pt in ER 11/26 @ 1200

## 2023-10-10 NOTE — H&P (Signed)
History and Physical   [name] ACZ:[id number] DOB: 1961/01/12 DOA: 10/10/2023  PCP: [name], MD   Patient coming from: Home/vascular surgery office  Chief Complaint: Shortness of breath, edema, elevated glucose  HPI: [name] is a 62 y.o. female with medical history significant of hypertension, hyperlipidemia, diabetes, hypothyroidism, paroxysmal atrial fibrillation, PAD, carotid artery disease, CAD, CKD 3B, COPD, neuropathy, substance use, alcohol use, cirrhosis, chronic combined systolic and diastolic CHF, obesity, OSA, anemia presenting with worsening shortness of breath, edema, elevated blood sugar.  Patient presenting today after being evaluated by her vascular surgeons office and found to be symptomatic with worsening edema, shortness of breath.  Does also note some high blood sugars recently.  Was actually seen 3 days ago at [facility] emergency department complaining of elevated blood sugar.  At the time blood sugars were measured in the 200s and no additional insulin was given.  Was noted to have acute on chronic edema but otherwise not symptomatic per chart review.  Did receive 1 dose of diuresis and was discharged home but reports she did not have significant urine output from that dose.  Also was admitted earlier in November with A-fib with RVR and acute on chronic CHF exacerbation with echo performed at that time.  Patient is reporting continued worsening shortness of breath and lower extremity edema as well as some continued elevated blood sugars.  She denies fevers, chills, chest pain, abdominal pain, constipation, diarrhea, nausea, vomiting.  ED Course: Vital signs in the ED notable for blood pressure in the 130s to 140s systolic.  Lab workup included BMP with BUN 54, creatinine stable 1.45, glucose 335.  CBC is pending.  BNP elevated to 1190.  Troponin mildly elevated at 24 with repeat pending.  TSH pending.  Respiratory panel for flu COVID and RSV negative.   Urinalysis pending.  Chest x-ray showed no acute normality but did demonstrate metallic foreign body at the left lower lobe which chart review reveals is a CardioMEMS device.  Patient received 40 mg IV Lasix, 6 units of NovoLog and DuoNeb in the ED.  Review of Systems: As per HPI otherwise all other systems reviewed and are negative.  Past Medical History:  Diagnosis Date   Acute renal failure ([name])    Acute respiratory failure with hypoxia ([name]) 12/02/2019   Alcohol abuse    Alcoholic cirrhosis ([name])    Anemia    Anxiety    Arthritis    "knees" (11/26/2018)   B12 deficiency    CAD (coronary artery disease)    a. 11/10/2014 Cath: LM nl, LAD min irregs, D1 30 ost, D2 50d, LCX 43m, OM1 80 p/m (1.5 mm vessel), OM2 49m, RCA nondom 67m-->med rx.. Demand ischemia in the setting of rapid a-fib.   Cardiomyopathy ([name])    Carotid artery disease ([name])    a. 01/2015 Carotid Angio: RICA 100, LICA 95p; b. 02/2015 s/p L CEA; c. 05/2019 Carotid U/S: RICA 100. RECA >50. LICA 1-39%.   Cellulitis    lower extremities   Cellulitis in diabetic foot ([name]) 07/08/2019   CHF (congestive heart failure) ([name])    Chronic combined systolic and diastolic CHF (congestive heart failure) ([name])    a. 10/2014 Echo: EF 40-45%; b. 10/2018 Echo: EF 45-50%, gr2 DD; c. 11/2019 Echo: EF 50%, mild LVH, gr2 DD (restrictive), antlat HK, Nl RV fxn. Mild BAE. RVSP 59.4mmHg.   CKD (chronic kidney disease), stage III ([name])    Cocaine abuse ([name])    COPD (chronic  obstructive pulmonary disease) ([name])    Critical ischemia of foot ([name]) 06/07/2021   Critical limb ischemia     Critical lower limb ischemia ([name])    Demand ischemia ([name]) 10/29/2014   Depression    Diabetes mellitus without complication ([name])    Diabetic peripheral neuropathy ([name])    DVT (deep venous thrombosis) ([name])    Dyspnea    Elevated troponin    a. Chronic elevation.   Fall 05/05/2021   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[name])    Peripheral arterial  disease/critical limb ischemia     GERD (gastroesophageal reflux disease)    Hyperlipemia    Hypertension    Hypokalemia    Hypomagnesemia    Hypothyroidism    Marijuana abuse    Narcotic abuse ([name])    Noncompliance    NSVT (nonsustained ventricular tachycardia) ([name])    Obesity    Osteomyelitis ([name]) 06/21/2019   PAF (paroxysmal atrial fibrillation) ([name])    Paroxysmal atrial tachycardia ([name])    Peripheral arterial disease ([name])    a. 01/2015 Angio/PTA: RSFA 99 (atherectomy/pta) - 1 vessel runoff via diff dzs peroneal; b. 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   Pneumonia    "once or twice" (11/26/2018)   Poorly controlled type 2 diabetes mellitus ([name])    Renal disorder    Renal insufficiency    a. 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   SIRS (systemic inflammatory response syndrome) ([name]) 04/06/2017   Sleep apnea    "couldn't handle wearing the mask" (11/26/2018)   Symptomatic bradycardia    a. Avoid AV blocking agent per EP. Prev req temp wire in 2017.   Tobacco abuse    Wrist fracture, left, closed, initial encounter 01/29/2015    Past Surgical History:  Procedure Laterality Date   ABDOMINAL AORTOGRAM N/A 06/26/2019   Procedure: ABDOMINAL AORTOGRAM;  Surgeon: Iran Ouch, MD;  Location: [facility];  Service: Cardiovascular;  Laterality: N/A;   ABDOMINAL AORTOGRAM W/LOWER EXTREMITY N/A 06/07/2021   Procedure: ABDOMINAL AORTOGRAM W/LOWER EXTREMITY;  Surgeon: Runell Gess, MD;  Location: [facility];  Service: Cardiovascular;  Laterality: N/A;   ABDOMINAL AORTOGRAM W/LOWER EXTREMITY N/A 08/11/2023   Procedure: ABDOMINAL AORTOGRAM W/LOWER EXTREMITY;  Surgeon: Leonie Douglas, MD;  Location: [facility];  Service: Cardiovascular;  Laterality: N/A;   AMPUTATION Right 06/14/2017   Procedure: Right foot transmetatarsal amputation;  Surgeon: Nadara Mustard, MD;  Location: [facility] OR;  Service: Orthopedics;  Laterality: Right;   AMPUTATION Left  06/28/2019   Procedure: AMPUTATION LEFT FIFTH TOE;  Surgeon: Larina Earthly, MD;  Location: [facility] OR;  Service: Vascular;  Laterality: Left;   AMPUTATION TOE Right 04/28/2017   Procedure: AMPUTATION OF RIGHT SECOND RAY;  Surgeon: Nadara Mustard, MD;  Location: [facility] OR;  Service: Orthopedics;  Laterality: Right;   CARDIAC CATHETERIZATION     CARDIAC CATHETERIZATION N/A 01/12/2016   Procedure: Temporary Wire;  Surgeon: Rollene Rotunda, MD;  Location: [facility];  Service: Cardiovascular;  Laterality: N/A;   CARDIOVERSION  ~ 02/2013   "twice"    CARDIOVERSION N/A 09/26/2023   Procedure: CARDIOVERSION (CATH LAB);  Surgeon: Maisie Fus, MD;  Location: [facility] INVASIVE CV LAB;  Service: Cardiovascular;  Laterality: N/A;   CAROTID ANGIOGRAM N/A 01/15/2015   Procedure: CAROTID ANGIOGRAM;  Surgeon: Runell Gess, MD;  Location: [facility] CATH LAB;  Service: Cardiovascular;  Laterality: N/A;   DILATION AND CURETTAGE OF UTERUS  1988   ENDARTERECTOMY Left 02/19/2015   Procedure: LEFT CAROTID ENDARTERECTOMY ;  Surgeon: Nada Libman, MD;  Location: [facility] OR;  Service: Vascular;  Laterality: Left;   FEMORAL-TIBIAL BYPASS GRAFT Left 06/28/2019   Procedure: BYPASS GRAFT LEFT LEG FEMORAL TO ANTERIOR TIBIAL ARTERY using LEFT GREATER SAPHENOUS VEIN;  Surgeon: Larina Earthly, MD;  Location: [facility] OR;  Service: Vascular;  Laterality: Left;   LEFT HEART CATHETERIZATION WITH CORONARY ANGIOGRAM N/A 10/31/2014   Procedure: LEFT HEART CATHETERIZATION WITH CORONARY ANGIOGRAM;  Surgeon: Kathleene Hazel, MD;  Location: [facility] CATH LAB;  Service: Cardiovascular;  Laterality: N/A;   LOWER EXTREMITY ANGIOGRAM N/A 09/10/2013   Procedure: LOWER EXTREMITY ANGIOGRAM;  Surgeon: Runell Gess, MD;  Location: [facility] CATH LAB;  Service: Cardiovascular;  Laterality: N/A;   LOWER EXTREMITY ANGIOGRAM N/A 01/15/2015   Procedure: LOWER EXTREMITY ANGIOGRAM;  Surgeon: Runell Gess, MD;  Location: [facility] CATH LAB;  Service: Cardiovascular;  Laterality: N/A;    LOWER EXTREMITY ANGIOGRAPHY N/A 04/13/2017   Procedure: Lower Extremity Angiography;  Surgeon: Runell Gess, MD;  Location: [facility] INVASIVE CV LAB;  Service: Cardiovascular;  Laterality: N/A;   LOWER EXTREMITY ANGIOGRAPHY Left 06/26/2019   Procedure: LOWER EXTREMITY ANGIOGRAPHY;  Surgeon: Iran Ouch, MD;  [facility];  Service: Cardiovascular;  Laterality: Left;   PERIPHERAL VASCULAR ATHERECTOMY Right 06/07/2021   Procedure: PERIPHERAL VASCULAR ATHERECTOMY;  Surgeon: Runell Gess, MD;  Location: [facility] INVASIVE CV LAB;  Service: Cardiovascular;  Laterality: Right;   PERIPHERAL VASCULAR BALLOON ANGIOPLASTY Left 06/26/2019   Procedure: PERIPHERAL VASCULAR BALLOON ANGIOPLASTY;  Surgeon: Iran Ouch, MD;  [facility];  Service: Cardiovascular;  Laterality: Left;  unable to cross lt sfa occlusion   PERIPHERAL VASCULAR INTERVENTION  04/13/2017   Procedure: Peripheral Vascular Intervention;  Surgeon: Runell Gess, MD;  Location: [facility] INVASIVE CV LAB;  Service: Cardiovascular;;   PERIPHERAL VASCULAR INTERVENTION  08/11/2023   Procedure: PERIPHERAL VASCULAR INTERVENTION;  Surgeon: Leonie Douglas, MD;  Location: [facility];  Service: Cardiovascular;;   PRESSURE SENSOR/CARDIOMEMS N/A 02/05/2020   Procedure: PRESSURE SENSOR/CARDIOMEMS;  Surgeon: Laurey Morale, MD;  Location: [facility] INVASIVE CV LAB;  Service: Cardiovascular;  Laterality: N/A;   VEIN HARVEST Left 06/28/2019   Procedure: LEFT LEG GREATER SAPHENOUS VEIN HARVEST;  Surgeon: Larina Earthly, MD;  Location: [facility] OR;  Service: Vascular;  Laterality: Left;    Social History  reports that she has been smoking e-cigarettes and cigarettes. She has a 44 pack-year smoking history. She has quit using smokeless tobacco.  Her smokeless tobacco use included snuff. She reports that she does not currently use alcohol. She reports that she does not currently use drugs after having used the following drugs:  "Crack" cocaine, Marijuana, and Oxycodone.  Allergies  Allergen Reactions   Gabapentin Nausea And Vomiting and Other (See Comments)    POSSIBLE SHAKING   Lyrica [Pregabalin] Other (See Comments)    Shaking     Family History  Problem Relation Age of Onset   Hypertension Mother    Diabetes Mother    Cancer Mother        breast, ovarian, colon   Clotting disorder Mother    Heart disease Mother    Heart attack Mother    Breast cancer Mother        in 57's   Hypertension Father    Heart disease Father    Emphysema Sister  smoked  Reviewed on admission  Prior to Admission medications   Medication Sig Start Date End Date Taking? Authorizing Provider  ACCU-CHEK GUIDE test strip USE TO CHECK BLOOD SUGAR FOUR TIMES DAILY 02/25/20   [provider]  acetaminophen (TYLENOL) 500 MG tablet Take 1,000 mg by mouth every 6 (six) hours as needed for headache (pain).    [provider]  albuterol (VENTOLIN HFA) 108 (90 Base) MCG/ACT inhaler USE 2 PUFFS BY MOUTH EVERY FOUR HOURS, AS NEEDED, FOR COUGHING/WHEEZING 07/12/23   [name], MD  apixaban (ELIQUIS) 5 MG TABS tablet Take 1 tablet (5 mg total) by mouth 2 (two) times daily. 07/05/23   Clegg, [name] D, NP  budesonide-formoterol (SYMBICORT) 160-4.5 MCG/ACT inhaler Inhale 2 puffs into the lungs 2 (two) times daily. 07/12/23   [name], MD  buPROPion ER (WELLBUTRIN SR) 100 MG 12 hr tablet Take 1 tablet (100 mg total) by mouth daily. 07/05/23   Clegg, [name] D, NP  Cholecalciferol (VITAMIN D3 PO) Take 1 capsule by mouth every morning.    [provider]  colchicine 0.6 MG tablet Take 1.2 mg by mouth See admin instructions. Take 1.2 mg for flair up and an hour later take 0.6 mg if needed    [provider]  EASY COMFORT PEN NEEDLES 31G X 5 MM MISC USE FOUR TIMES A DAY FOR INSULIN ADMINISTRATION 08/30/22   Georgian Co M, PA-C  fluticasone (FLONASE) 50 MCG/ACT nasal spray Place 1 spray into both  nostrils as needed for allergies or rhinitis.    [provider]  insulin lispro (HUMALOG) 100 UNIT/ML KwikPen Inject 10 Units into the skin 3 (three) times daily with meals. 04/05/23   [name], MD  LANTUS SOLOSTAR 100 UNIT/ML Solostar Pen Inject 37 Units into the skin daily. 04/05/23   [name], MD  levothyroxine (SYNTHROID) 50 MCG tablet TAKE 1 TABLET (50 MCG TOTAL) BY MOUTH DAILY BEFORE BREAKFAST (AM) 05/02/23   [name], MD  metolazone (ZAROXOLYN) 2.5 MG tablet Take 1 tablet (2.5 mg total) by mouth once a week. Every Wednesday Patient taking differently: Take 2.5 mg by mouth once a week. Took on Tuesday due to being so bloated 10/03/23 01/01/24  Laurey Morale, MD  metoprolol succinate (TOPROL-XL) 25 MG 24 hr tablet Take 1 tablet (25 mg total) by mouth daily. 09/28/23 10/28/23  Jerald Kief, MD  nystatin cream (MYCOSTATIN) Apply topically 2 (two) times daily. 08/05/23   Kathlen Mody, MD  pantoprazole (PROTONIX) 40 MG tablet TAKE 1 TABLET (40 MG TOTAL) BY MOUTH DAILY(AM) 05/02/23   [name], MD  polyethylene glycol (MIRALAX / GLYCOLAX) 17 g packet Take 17 g by mouth daily as needed for moderate constipation. 08/05/23   Kathlen Mody, MD  potassium chloride SA (KLOR-CON M20) 20 MEQ tablet Take 2 tablets (40 mEq total) by mouth daily. 10/03/23 01/01/24  Laurey Morale, MD  rosuvastatin (CRESTOR) 40 MG tablet TAKE 1 TABLET (40 MG TOTAL) BY MOUTH DAILY (BEDTIME) 08/31/23   Cannon Kettle, PA-C  torsemide (DEMADEX) 100 MG tablet Take 1 tablet (100 mg total) by mouth 2 (two) times daily. 10/03/23   Laurey Morale, MD  triamcinolone ointment (KENALOG) 0.1 % Apply topically 2 (two) times daily. 08/09/23   [name], MD  VITAMIN E PO Take 1 tablet by mouth daily.    [provider]  tiotropium (SPIRIVA HANDIHALER) 18 MCG inhalation capsule Place 1 capsule (18 mcg total) into inhaler and inhale every morning. Patient  not taking: Reported on  02/09/2021 12/09/19 02/09/21  [name], MD    Physical Exam: Vitals:   10/10/23 1153 10/10/23 1157  BP: (!) 140/45   Pulse: 71   Resp: 18   Temp: 98.3 F (36.8 C)   SpO2: 100%   Weight:  107.5 kg  Height:  5\' 4"  (1.626 m)    Physical Exam Constitutional:      General: She is not in acute distress.    Appearance: Normal appearance. She is obese.  HENT:     Head: Normocephalic and atraumatic.     Mouth/Throat:     Mouth: Mucous membranes are moist.     Pharynx: Oropharynx is clear.  Eyes:     Extraocular Movements: Extraocular movements intact.     Pupils: Pupils are equal, round, and reactive to light.  Cardiovascular:     Rate and Rhythm: Normal rate and regular rhythm.     Pulses: Normal pulses.     Heart sounds: Normal heart sounds.  Pulmonary:     Effort: Pulmonary effort is normal. No respiratory distress.     Breath sounds: Rales present.  Abdominal:     General: Bowel sounds are normal. There is no distension.     Palpations: Abdomen is soft.     Tenderness: There is no abdominal tenderness.  Musculoskeletal:        General: No swelling or deformity.     Right lower leg: Edema (R>L) present.     Left lower leg: Edema present.     Comments: Status post right ray amputation. Wounds at RLE.  Skin:    General: Skin is warm and dry.  Neurological:     General: No focal deficit present.     Mental Status: Mental status is at baseline.    Labs on Admission: I have personally reviewed following labs and imaging studies  CBC: Recent Labs  Lab 10/07/23 1448 10/10/23 1212  WBC 10.0  --   NEUTROABS 7.3  --   HGB 7.9* 9.2*  HCT 26.7* 27.0*  MCV 94.3  --   PLT 365  --     Basic Metabolic Panel: Recent Labs  Lab 10/03/23 1530 10/07/23 1448 10/10/23 1202 10/10/23 1212 10/10/23 1237  NA 136 138 139 138  --   K 4.3 4.0 4.8 4.7  --   CL 103 107 102 103  --   CO2 21* 23 26  --   --   GLUCOSE 399* 221* 335* 330*  --   BUN 53* 44* 54* 53*  --    CREATININE 1.43* 1.29* 1.45* 1.40*  --   CALCIUM 9.1 8.5* 9.0  --   --   MG  --  2.1  --   --  2.0    GFR: Estimated Creatinine Clearance: 51.1 mL/min (A) (by C-G formula based on SCr of 1.4 mg/dL (H)).  Liver Function Tests: Recent Labs  Lab 10/07/23 1448  AST 12*  ALT 24  ALKPHOS 271*  BILITOT 0.3  PROT 6.9  ALBUMIN 2.7*    Urine analysis:    Component Value Date/Time   COLORURINE STRAW (A) 10/07/2023 1615   APPEARANCEUR CLEAR 10/07/2023 1615   LABSPEC 1.009 10/07/2023 1615   PHURINE 5.0 10/07/2023 1615   GLUCOSEU 50 (A) 10/07/2023 1615   HGBUR NEGATIVE 10/07/2023 1615   BILIRUBINUR NEGATIVE 10/07/2023 1615   KETONESUR NEGATIVE 10/07/2023 1615   PROTEINUR 30 (A) 10/07/2023 1615   UROBILINOGEN 0.2 09/05/2015 0012   NITRITE  NEGATIVE 10/07/2023 1615   LEUKOCYTESUR NEGATIVE 10/07/2023 1615    Radiological Exams on Admission: DG Chest 2 View  Result Date: 10/10/2023 CLINICAL DATA:  Cough. EXAM: CHEST - 2 VIEW COMPARISON:  Chest radiograph from 10/07/2023 and chest CT scan from 06/16/2023. FINDINGS: Bilateral lung fields are clear. Bilateral costophrenic angles are clear. Stable cardio-mediastinal silhouette. No acute osseous abnormalities. The soft tissues are within normal limits. There is an approximately 1 cm long metallic linear structure overlying the left posterior mediastinum, which corresponds to foreign body in the left lung lower lobe pulmonary vessel when compared to the prior CT scan from 06/16/2023. IMPRESSION: *No active cardiopulmonary disease. *Metallic foreign body in the left lung lower lobe pulmonary vessel, when correlated with prior chest CT scan. Electronically Signed   By: Jules Schick M.D.   On: 10/10/2023 14:03    EKG: Independently reviewed.  Sinus rhythm at 77 bpm.  Low voltage multiple leads.  Nonspecific T wave flattening.  Assessment/Plan Active Problems:   S/P peripheral artery angioplasty - [place]bo[place]awk atherectomy; R SFA   Anemia of  chronic disease   COPD (chronic obstructive pulmonary disease) ([name])   Hypothyroidism   Hyperlipidemia   Alcoholic cirrhosis ([name])   DM (diabetes mellitus), type 2 with peripheral vascular complications ([name])   PAF (paroxysmal atrial fibrillation) ([name])   Carotid arterial disease ([name])   Coronary artery disease   Chronic combined systolic and diastolic CHF (congestive heart failure) ([name])   Hypertension associated with diabetes ([name])   Severe obesity (BMI >= 40) ([name])   S/P transmetatarsal amputation of foot, right ([name])   Depression   PAD (peripheral artery disease) ([name])   OSA (obstructive sleep apnea)   Stage 3b chronic kidney disease ([name])   Acute on chronic combined systolic and diastolic CHF > Patient presenting with continued worsening shortness of breath and lower extremity edema.  Not currently requiring oxygen. > Chest x-ray without acute abnormality, but BNP elevated to 1190.  Creatinine stable.  Weight currently 235 with patient previously reporting baseline weight closer to 225. > Home diuretics are torsemide 100 mg twice daily and metolazone so we will need high-dose Lasix to reach threshold. Initially received 40 mg IV Lasix. > Last echo was during admission earlier this month which showed EF 45-50%, G2 DD, normal RV function. - Monitor on progressive overnight - Increase Lasix dose to 120 mg IV twice daily - Strict I's and O's, daily weights - No need to repeat echocardiogram - Magnesium level normal - Trend renal function and electrolytes - Fluid restricted diet  PAD History of limb ischemia > Known history of PAD with prior intervention including atherectomy and other percutaneous intervention followed by left, femoral to posterior tibial artery bypass. > Patient is also status post right ray amputation.  Now developing new ulcerations on the right lower extremity. > Vascular visit today patient is noted to have new blistering on her right lower extremity.  But  has had some healing of her right plantar ulcer. > Plan to continue with medical management for now. - Vascular surgery recommending starting aspirin and Plavix in note but she is not on this yet an may need further discussion before starting triple therapy. - Continue home Eliquis - Continue home rosuvastatin  Hypertension - Diuresis as above - Continue metoprolol  Hyperlipidemia - Continue home rosuvastatin   Diabetes > 37 units long-acting daily, 10 units with meals outpatient. - 30 units long-acting daily, 8 units with meals, SSI  Hypothyroidism - Continue home  Synthroid  Paroxysmal atrial fibrillation - Continue home metoprolol and Eliquis  Carotid artery disease - Continue home rosuvastatin, Eliquis  CAD > Chart review shows cath in 2015 with single-vessel stenosis of 80%, no history of stenting noted. - Continue home atorvastatin, metoprolol, Eliquis  CKD 3B > Creatinine stable at 1.45 in ED. - Trend renal function and electrolytes  COPD - Replace home Symbicort with formulary Dulera - Continue.  Albuterol  Cirrhosis - Trend LFTs  Anemia > CBC pending. - Trend CBC  Obesity - Noted  Substance use history > Noted history of narcotic use, marijuana, alcohol, cocaine use.  DVT prophylaxis: Eliquis Code Status:   Full Family Communication:  None on admission  Disposition Plan:   Patient is from:  Home  Anticipated DC to:  Home  Anticipated DC date:  1 to 5 days  Anticipated DC barriers: None  Consults called:  None Admission status:  Observation, progressive  Severity of Illness: The appropriate patient status for this patient is OBSERVATION. Observation status is judged to be reasonable and necessary in order to provide the required intensity of service to ensure the patient's safety. The patient's presenting symptoms, physical exam findings, and initial radiographic and laboratory data in the context of their medical condition is felt to place them  at decreased risk for further clinical deterioration. Furthermore, it is anticipated that the patient will be medically stable for discharge from the hospital within 2 midnights of admission.    Synetta Fail MD [facility]  How to contact the [facility] Attending or Consulting provider 7A - 7P or covering provider during after hours 7P -7A, for this patient?   Check the care team in [facility] and look for a) attending/consulting [facility] provider listed and b) the [facility] team listed Log into [email or web address] and use 's universal password to access. If you do not have the password, please contact the hospital operator. Locate the [facility] provider you are looking for under [facility] and page to a number that you can be directly reached. If you still have difficulty reaching the provider, please page the [facility] (Director on Call) for the Hospitalists listed on amion for assistance.  10/10/2023, 2:37 PM

## 2023-10-11 ENCOUNTER — Ambulatory Visit (HOSPITAL_BASED_OUTPATIENT_CLINIC_OR_DEPARTMENT_OTHER): Payer: 59 | Admitting: Physician Assistant

## 2023-10-11 DIAGNOSIS — I5020 Unspecified systolic (congestive) heart failure: Secondary | ICD-10-CM | POA: Insufficient documentation

## 2023-10-11 DIAGNOSIS — E1169 Type 2 diabetes mellitus with other specified complication: Secondary | ICD-10-CM | POA: Diagnosis present

## 2023-10-11 DIAGNOSIS — E039 Hypothyroidism, unspecified: Secondary | ICD-10-CM | POA: Diagnosis present

## 2023-10-11 DIAGNOSIS — I2510 Atherosclerotic heart disease of native coronary artery without angina pectoris: Secondary | ICD-10-CM | POA: Diagnosis not present

## 2023-10-11 DIAGNOSIS — D509 Iron deficiency anemia, unspecified: Secondary | ICD-10-CM | POA: Diagnosis present

## 2023-10-11 DIAGNOSIS — N1832 Chronic kidney disease, stage 3b: Secondary | ICD-10-CM | POA: Diagnosis present

## 2023-10-11 DIAGNOSIS — R600 Localized edema: Secondary | ICD-10-CM | POA: Diagnosis present

## 2023-10-11 DIAGNOSIS — Z89431 Acquired absence of right foot: Secondary | ICD-10-CM | POA: Diagnosis not present

## 2023-10-11 DIAGNOSIS — D631 Anemia in chronic kidney disease: Secondary | ICD-10-CM | POA: Diagnosis present

## 2023-10-11 DIAGNOSIS — I10 Essential (primary) hypertension: Secondary | ICD-10-CM

## 2023-10-11 DIAGNOSIS — Z1152 Encounter for screening for COVID-19: Secondary | ICD-10-CM | POA: Diagnosis not present

## 2023-10-11 DIAGNOSIS — F32A Depression, unspecified: Secondary | ICD-10-CM | POA: Diagnosis present

## 2023-10-11 DIAGNOSIS — Z7901 Long term (current) use of anticoagulants: Secondary | ICD-10-CM | POA: Diagnosis not present

## 2023-10-11 DIAGNOSIS — L97519 Non-pressure chronic ulcer of other part of right foot with unspecified severity: Secondary | ICD-10-CM | POA: Diagnosis present

## 2023-10-11 DIAGNOSIS — I110 Hypertensive heart disease with heart failure: Secondary | ICD-10-CM | POA: Diagnosis present

## 2023-10-11 DIAGNOSIS — Z6841 Body Mass Index (BMI) 40.0 and over, adult: Secondary | ICD-10-CM | POA: Diagnosis not present

## 2023-10-11 DIAGNOSIS — E1122 Type 2 diabetes mellitus with diabetic chronic kidney disease: Secondary | ICD-10-CM | POA: Diagnosis present

## 2023-10-11 DIAGNOSIS — I429 Cardiomyopathy, unspecified: Secondary | ICD-10-CM | POA: Diagnosis present

## 2023-10-11 DIAGNOSIS — K7030 Alcoholic cirrhosis of liver without ascites: Secondary | ICD-10-CM | POA: Diagnosis present

## 2023-10-11 DIAGNOSIS — I5033 Acute on chronic diastolic (congestive) heart failure: Secondary | ICD-10-CM

## 2023-10-11 DIAGNOSIS — E11621 Type 2 diabetes mellitus with foot ulcer: Secondary | ICD-10-CM | POA: Diagnosis present

## 2023-10-11 DIAGNOSIS — E1142 Type 2 diabetes mellitus with diabetic polyneuropathy: Secondary | ICD-10-CM | POA: Diagnosis present

## 2023-10-11 DIAGNOSIS — E1151 Type 2 diabetes mellitus with diabetic peripheral angiopathy without gangrene: Secondary | ICD-10-CM | POA: Diagnosis present

## 2023-10-11 DIAGNOSIS — Z794 Long term (current) use of insulin: Secondary | ICD-10-CM | POA: Diagnosis not present

## 2023-10-11 DIAGNOSIS — I509 Heart failure, unspecified: Secondary | ICD-10-CM | POA: Insufficient documentation

## 2023-10-11 DIAGNOSIS — J449 Chronic obstructive pulmonary disease, unspecified: Secondary | ICD-10-CM | POA: Diagnosis present

## 2023-10-11 DIAGNOSIS — E1165 Type 2 diabetes mellitus with hyperglycemia: Secondary | ICD-10-CM | POA: Diagnosis present

## 2023-10-11 DIAGNOSIS — I480 Paroxysmal atrial fibrillation: Secondary | ICD-10-CM | POA: Diagnosis present

## 2023-10-11 LAB — URINALYSIS, ROUTINE W REFLEX MICROSCOPIC
Bilirubin Urine: NEGATIVE
Glucose, UA: 150 mg/dL — AB
Hgb urine dipstick: NEGATIVE
Ketones, ur: NEGATIVE mg/dL
Leukocytes,Ua: NEGATIVE
Nitrite: NEGATIVE
Protein, ur: NEGATIVE mg/dL
Specific Gravity, Urine: 1.009 (ref 1.005–1.030)
pH: 5 (ref 5.0–8.0)

## 2023-10-11 LAB — CBC
HCT: 25.2 % — ABNORMAL LOW (ref 36.0–46.0)
Hemoglobin: 7.7 g/dL — ABNORMAL LOW (ref 12.0–15.0)
MCH: 27.5 pg (ref 26.0–34.0)
MCHC: 30.6 g/dL (ref 30.0–36.0)
MCV: 90 fL (ref 80.0–100.0)
Platelets: 263 10*3/uL (ref 150–400)
RBC: 2.8 MIL/uL — ABNORMAL LOW (ref 3.87–5.11)
RDW: 15.1 % (ref 11.5–15.5)
WBC: 8.9 10*3/uL (ref 4.0–10.5)
nRBC: 0 % (ref 0.0–0.2)

## 2023-10-11 LAB — COMPREHENSIVE METABOLIC PANEL
ALT: 16 U/L (ref 0–44)
AST: 14 U/L — ABNORMAL LOW (ref 15–41)
Albumin: 2.4 g/dL — ABNORMAL LOW (ref 3.5–5.0)
Alkaline Phosphatase: 196 U/L — ABNORMAL HIGH (ref 38–126)
Anion gap: 9 (ref 5–15)
BUN: 52 mg/dL — ABNORMAL HIGH (ref 6–20)
CO2: 27 mmol/L (ref 22–32)
Calcium: 8.4 mg/dL — ABNORMAL LOW (ref 8.9–10.3)
Chloride: 98 mmol/L (ref 98–111)
Creatinine, Ser: 1.44 mg/dL — ABNORMAL HIGH (ref 0.44–1.00)
GFR, Estimated: 42 mL/min — ABNORMAL LOW (ref >60)
Glucose, Bld: 405 mg/dL — ABNORMAL HIGH (ref 70–99)
Potassium: 4.5 mmol/L (ref 3.5–5.1)
Sodium: 134 mmol/L — ABNORMAL LOW (ref 135–145)
Total Bilirubin: 0.4 mg/dL (ref <1.2)
Total Protein: 6 g/dL — ABNORMAL LOW (ref 6.5–8.1)

## 2023-10-11 LAB — GLUCOSE, CAPILLARY
Glucose-Capillary: 383 mg/dL — ABNORMAL HIGH (ref 70–99)
Glucose-Capillary: 318 mg/dL — ABNORMAL HIGH (ref 70–99)
Glucose-Capillary: 179 mg/dL — ABNORMAL HIGH (ref 70–99)
Glucose-Capillary: 228 mg/dL — ABNORMAL HIGH (ref 70–99)

## 2023-10-11 MED ORDER — EMPAGLIFLOZIN 10 MG PO TABS
10.0000 mg | ORAL_TABLET | Freq: Every day | ORAL | Status: DC
  Administered 2023-10-11 – 2023-10-12 (×2): 10 mg via ORAL
  Filled 2023-10-11 (×2): qty 1

## 2023-10-11 MED ORDER — SPIRONOLACTONE 12.5 MG HALF TABLET
12.5000 mg | ORAL_TABLET | Freq: Every day | ORAL | Status: DC
  Administered 2023-10-11 – 2023-10-13 (×3): 12.5 mg via ORAL
  Filled 2023-10-11 (×3): qty 1

## 2023-10-11 MED ORDER — GUAIFENESIN-DM 100-10 MG/5ML PO SYRP
5.0000 mL | ORAL_SOLUTION | ORAL | Status: DC | PRN
  Administered 2023-10-11 (×2): 5 mL via ORAL
  Filled 2023-10-11 (×2): qty 5

## 2023-10-11 MED ORDER — INSULIN GLARGINE-YFGN 100 UNIT/ML ~~LOC~~ SOLN
15.0000 [IU] | Freq: Every day | SUBCUTANEOUS | Status: DC
  Administered 2023-10-12 – 2023-10-13 (×2): 15 [IU] via SUBCUTANEOUS
  Filled 2023-10-11 (×2): qty 0.15

## 2023-10-11 MED ORDER — INSULIN ASPART 100 UNIT/ML IJ SOLN
5.0000 [IU] | Freq: Three times a day (TID) | INTRAMUSCULAR | Status: DC
  Administered 2023-10-11 – 2023-10-13 (×4): 5 [IU] via SUBCUTANEOUS

## 2023-10-11 MED ORDER — OXYCODONE HCL 5 MG PO TABS
5.0000 mg | ORAL_TABLET | ORAL | Status: DC | PRN
Start: 2023-10-11 — End: 2023-10-13
  Administered 2023-10-11 – 2023-10-13 (×5): 5 mg via ORAL
  Filled 2023-10-11 (×5): qty 1

## 2023-10-11 MED ORDER — FUROSEMIDE 10 MG/ML IJ SOLN
60.0000 mg | Freq: Two times a day (BID) | INTRAMUSCULAR | Status: DC
  Administered 2023-10-11 – 2023-10-12 (×2): 60 mg via INTRAVENOUS
  Filled 2023-10-11 (×2): qty 6

## 2023-10-11 NOTE — Progress Notes (Addendum)
Progress Note   Patient: [name] VHQ:[id number] DOB: 1961-10-25 DOA: 10/10/2023     0 DOS: the patient was seen and examined on 10/11/2023   Brief hospital course: [name] was admitted to the hospital with the working diagnosis of heart failure exacerbation.   62 yo female with the past medical history of hypertension, dyslipidemia, paroxysmal atrial fibrillation, coronary artery disease, CKD, and COPD who presented with dyspnea and edema. Patient was referred from the vascular surgery office to the ED due to signs of volume overload. Recent ED evaluation 3 days prior to admission for hyperglycemia and acute on chronic edema. Patient received diuretic therapy and was discharged home. On her initial physical examination her blood pressure was 140/45, HR 71, RR 18 and 02 saturation 100%, lungs with no wheezing but positive rales, heart with S1 and S2 present and regular, abdomen with no distention and positive lower extremity edema. Positive wounds right lower extremity edema.   Na 139, K 4,8 Cl 102 bicarbonate 26, glucose 335, bun 54, cr 1,45  BNP 1,190  High sensitive troponin 24 and 27  Wbc 10,8 hgb 8,7 plt 329   Chest radiograph with hypoinflation, bilateral hilar  congestion with mild cephalization of the vasculature.   EKG 77 bpm, normal axis, normal intervals, sinus rhythm with no significant ST segment or T wave changes.   Patient has been placed on furosemide for diuresis.   Assessment and Plan: * Acute on chronic diastolic CHF (congestive heart failure) ([name]) Echocardiogram with mild reduction in LV systolic function with EF 45 to 50%, mild concentric left ventricular hypertrophy, RV systolic function is preserved, RVSP 50 mmHg, LA with mild dilatation. LV with antero lateral wall and apical lateral segment hypokinetic.   Patient with improvement in volume status but not yet back to baseline.  Systolic blood pressure 120 to 140 mmHg.   Plan to continue diuresis with  furosemide 60 mg IV bid. Continue metoprolol 25 mg daily (succinate). Will SGLT 2 inh and spironolactone to augment diuresis.   PAF (paroxysmal atrial fibrillation) ([name]) Patient is on sinus rhythm Had direct current cardioversion during her last hospitalization on 09/26/23.   Plan to continue rate control with metoprolol and anticoagulation with apixaban.   Stage 3b chronic kidney disease ([name]) Renal function with serum cr at 1,4 with K at 4,5 and serum bicarbonate at 27.  Na 134   Plan to continue diuresis with furosemide IV, add SGLT 2 inh and low dose spironolactone, close follow up on electrolytes and renal function.  Follow up renal function in am.   Coronary artery disease Troponin elevation due to heart failure, ruled out acute coronary syndrome.  Continue statin therapy.   Anemia of chronic disease Follow up Hgb is 7.7  Will check iron panel, she had iron deficiency in the past.    Essential hypertension Continue blood pressure monitoring.  IV diuresis with furosemide. She will benefit from ace inh or ARB.   Type 2 diabetes mellitus with hyperlipidemia ([name]) Uncontrolled with hyperglycemia.   Plan to continue glucose cover and monitoring with insulin sliding scale.  Basal insulin, will decrease dose to 15 units, and pre meal to 5 units to prevent hypoglycemia.   Continue with statin therapy.   PAD (peripheral artery disease) ([name]) Bilateral wounds lower extremities.  No signs of local infection, plan to continue wound care and follow up with vascular surgery. Yesterday patient had fever spike, but so far today she has been afebrile.   Hold on  antibiotic therapy for now.   Patient has been wheelchair bound, apparently she lost her rolling walker.  Will consult PT and Ot.   COPD (chronic obstructive pulmonary disease) ([name]) No signs of acute exacerbation. Continue with bronchodilator therapy.   Alcoholic cirrhosis ([name]) No signs of acute  decompensation. Continue supportive medical care.   Obesity, class 2 Calculated BMI is 38.8   Depression Continue with bupropion.   Hypothyroidism Continue with levothyroxine       Subjective: Patient is feeling better, her dyspnea and edema have improved, she has no pain at her lower extremities wounds   Physical Exam: Vitals:   10/10/23 1925 10/10/23 1958 10/11/23 0000 10/11/23 0338  BP:  (!) 135/53 (!) 128/36 (!) 144/88  Pulse:  82 83 85  Resp: (!) 25 16 15 19   Temp:  (!) 100.7 F (38.2 C) (!) 100.6 F (38.1 C) 98.3 F (36.8 C)  TempSrc:  Oral Oral Oral  SpO2:  92% 95% 90%  Weight:    102.6 kg  Height:       Neurology awake and alert ENT with mild pallor with no icterus Cardiovascular with S1 and S2 present and regular with no gallops, rubs or murmurs.  Mild JVD Positive lower extremity edema + to ++ Respiratory with prolonged expiratory phase with scattered rhonchi and rales, no wheezing  Abdomen with no distention  Bilateral lower extremity wounds, stage 3 with not able to stage at right forefoot plantar region.          Data Reviewed:    Family Communication: no family at the bedside   Disposition: Status is: Inpatient Remains inpatient appropriate because: IV diuresis   Planned Discharge Destination: Home     Author: Coralie Keens, MD 10/11/2023 12:42 PM  For on call review [email or web address].

## 2023-10-11 NOTE — Hospital Course (Addendum)
[name] was admitted to the hospital with the working diagnosis of heart failure exacerbation.   63 yo female with the past medical history of hypertension, dyslipidemia, paroxysmal atrial fibrillation, coronary artery disease, CKD, and COPD who presented with dyspnea and edema. Patient was referred from the vascular surgery office to the ED due to signs of volume overload. Recent ED evaluation 3 days prior to admission for hyperglycemia and acute on chronic edema. Patient received diuretic therapy and was discharged home. On her initial physical examination on the current admission her blood pressure was 140/45, HR 71, RR 18 and 02 saturation 100%, lungs with no wheezing but positive rales, heart with S1 and S2 present and regular, abdomen with no distention and positive lower extremity edema. Positive wounds right lower extremity edema.   Na 139, K 4,8 Cl 102 bicarbonate 26, glucose 335, bun 54, cr 1,45  BNP 1,190  High sensitive troponin 24 and 27  Wbc 10,8 hgb 8,7 plt 329   Chest radiograph with hypoinflation, bilateral hilar  congestion with mild cephalization of the vasculature.   EKG 77 bpm, normal axis, normal intervals, sinus rhythm with no significant ST segment or T wave changes.   Patient has been placed on furosemide for diuresis.   11/28 patient responding well to diuresis.  11/29 patient will follow up with primary care and cardiology, she has been advised to be adherent to a cardiac diet with salt and fluid restrictions.

## 2023-10-11 NOTE — Assessment & Plan Note (Signed)
Calculated BMI is 38.8

## 2023-10-11 NOTE — Assessment & Plan Note (Signed)
Patient is on sinus rhythm Had direct current cardioversion during her last hospitalization on 09/26/23.   Plan to continue rate control with metoprolol and anticoagulation with apixaban.

## 2023-10-11 NOTE — Consult Note (Addendum)
WOC Nurse Consult Note: Reason for Consult: Requested to assess bilateral wound legs.  Wound type: Blisters, swallowed leg, hot skin. Does not refer pain.  Measurement: 2 blisters on the left lower leg. Callus on the left foot.  3 blister on the lower leg, and 2 blisters on the upper leg. Aproxx. 0.6x0.6x0.1 cm each. Full thickness on the right foot. 0.8x0.8x0.2 cm  Wound bed: 100% red the left leg. 60% yellow. 40% red on the right leg.  Periwound: swallowed, redness, and hot skin. Dressing procedure/placement/frequency: Left leg: Foam dressing, Change each 3 days. Right leg: Xeroform and gauze. Bandage on both legs, not tight. On the full thickness below the right foot: cover with foam dressing. Keep dry and clean.  Secure chat with the doctor about the possibility of an antibiotic prescription.    WOC team will not plan to follow further.  Please reconsult if further assistance is needed. Thank-you,  Denyse Amass BSN, RN, [employer], WOC  (Pager: [phone])

## 2023-10-11 NOTE — Assessment & Plan Note (Addendum)
Follow up Hgb is 7.6  Iron panel with serum iron 18, TIBC 301, transferrin saturation 6 and ferritin 39. Consistent with anemia of iron deficiency.   SP IV iron infusion 200 mg of iron sucrose.  Follow up cell count and iron stores as outpatient.

## 2023-10-11 NOTE — Assessment & Plan Note (Addendum)
Continue metoprolol and will add low dose losartan.

## 2023-10-11 NOTE — Assessment & Plan Note (Addendum)
Echocardiogram with mild reduction in LV systolic function with EF 45 to 50%, mild concentric left ventricular hypertrophy, RV systolic function is preserved, RVSP 50 mmHg, LA with mild dilatation. LV with antero lateral wall and apical lateral segment hypokinetic.   Patient was placed on IV furosemide for diuresis, negative fluid balance was achieved, with significant improvement in her symptoms. Patient lost 6 Kg during this hospitalization.    At the time of her discharge she will resume torsemide 100 mg bid.  Continue metoprolol 25 mg daily (succinate). Resumed spironolactone and losartan with good toleration.  No SGLT 2 inh due to history of severe urinary tract infections.  Will use metolazone only as needed for now until follow up with heart failure clinic.  Patient has a Cardiomems in place.   Plan to follow up with heart failure clinic as outpatient.

## 2023-10-11 NOTE — Assessment & Plan Note (Signed)
Troponin elevation due to heart failure, ruled out acute coronary syndrome.  Continue statin therapy.

## 2023-10-11 NOTE — Assessment & Plan Note (Signed)
Continue with bupropion.

## 2023-10-11 NOTE — Plan of Care (Signed)
Pt given IV lasix and urine output improving

## 2023-10-11 NOTE — Evaluation (Signed)
Physical Therapy Evaluation Patient Details Name: [name] MRN: [id number] DOB: 12-Apr-1961 Today's Date: 10/11/2023  History of Present Illness  Pt is a 62 y/o female admitted 11/26 from her vascular surgeons office, found to be symptomatic with worsening edema, SOB and elevated glucose.  PMH significant for afib on xarelto, DM, HTN, PAD s/p R TMA, COPD, CKD stage IIIb, gout, right foot ulcer, carotid stenosis.  Clinical Impression  Pt admitted with/for the above symptoms due to CHF.  Now feeling a little better and mobilizing at CG to supervision level.  Pt currently limited functionally due to the problems listed below.  (see problems list.)  Pt will benefit from PT to maximize function and safety to be able to get home safely with available assist.         If plan is discharge home, recommend the following: A little help with bathing/dressing/bathroom;Assistance with cooking/housework;Assist for transportation   Can travel by private vehicle        Equipment Recommendations  (power chair)  Recommendations for Other Services       Functional Status Assessment Patient has had a recent decline in their functional status and demonstrates the ability to make significant improvements in function in a reasonable and predictable amount of time.     Precautions / Restrictions Precautions Precautions: Fall      Mobility  Bed Mobility               General bed mobility comments: up in the chair on arrival    Transfers Overall transfer level: Needs assistance Equipment used: None Transfers: Bed to chair/wheelchair/BSC   Stand pivot transfers: Supervision         General transfer comment: uses UE's by necessity and appropriately for transfer to [facility] and back to the chair..  Needed 2 attempts and rocking to stand from the lower recliner chair.    Ambulation/Gait               General Gait Details: Not tested due to pt rarely walks and only for 5-10 feet.  pt  deferred today.  Stairs            Wheelchair Mobility     Tilt Bed    Modified Rankin (Stroke Patients Only)       Balance Overall balance assessment: Needs assistance Sitting-balance support: No upper extremity supported, Feet supported Sitting balance-Leahy Scale: Good     Standing balance support: Bilateral upper extremity supported, Single extremity supported Standing balance-Leahy Scale: Poor                               Pertinent Vitals/Pain Pain Assessment Pain Assessment: Faces Faces Pain Scale: No hurt Pain Intervention(s): Monitored during session    Home Living Family/patient expects to be discharged to:: Private residence Living Arrangements: Other relatives Available Help at Discharge: Family;Available 24 hours/day Type of Home: Apartment Home Access: Stairs to enter;Level entry       Home Layout: One level Home Equipment: BSC/3in1;Shower seat (tub lift.) Additional Comments: Pt lives with sister    Prior Function Prior Level of Function : Needs assist             Mobility Comments: Mod I at a w/c level in the home ADLs Comments: Mod I for basic ADL's, sister does i ADL's     Extremity/Trunk Assessment   Upper Extremity Assessment Upper Extremity Assessment: Overall WFL for tasks assessed (general weakness)  Lower Extremity Assessment Lower Extremity Assessment: Overall WFL for tasks assessed (general weakness)    Cervical / Trunk Assessment Cervical / Trunk Assessment: Kyphotic  Communication   Communication Communication: No apparent difficulties  Cognition Arousal: Alert Behavior During Therapy: WFL for tasks assessed/performed Overall Cognitive Status: Within Functional Limits for tasks assessed                                          General Comments      Exercises     Assessment/Plan    PT Assessment Patient needs continued PT services  PT Problem List Decreased  strength;Decreased activity tolerance;Decreased mobility;Decreased knowledge of use of DME;Cardiopulmonary status limiting activity       PT Treatment Interventions Gait training;Functional mobility training;Therapeutic activities;Patient/family education;Balance training    PT Goals (Current goals can be found in the Care Plan section)  Acute Rehab PT Goals Patient Stated Goal: I'd love to get a power chair PT Goal Formulation: With patient Time For Goal Achievement: 10/25/23 Potential to Achieve Goals: Good    Frequency Min 1X/week     Co-evaluation               AM-PAC PT "6 Clicks" Mobility  Outcome Measure Help needed turning from your back to your side while in a flat bed without using bedrails?: A Little Help needed moving from lying on your back to sitting on the side of a flat bed without using bedrails?: A Little Help needed moving to and from a bed to a chair (including a wheelchair)?: A Little Help needed standing up from a chair using your arms (e.g., wheelchair or bedside chair)?: A Little Help needed to walk in hospital room?: A Lot Help needed climbing 3-5 steps with a railing? : A Lot 6 Click Score: 16    End of Session   Activity Tolerance: Patient tolerated treatment well Patient left: in chair;with call bell/phone within reach Nurse Communication: Mobility status PT Visit Diagnosis: Unsteadiness on feet (R26.81);Other abnormalities of gait and mobility (R26.89);Muscle weakness (generalized) (M62.81)    Time: 2952-8413 PT Time Calculation (min) (ACUTE ONLY): 21 min   Charges:   PT Evaluation $PT Eval Moderate Complexity: 1 Mod   PT General Charges $$ ACUTE PT VISIT: 1 Visit         10/11/2023  Jacinto Halim., PT Acute Rehabilitation Services [phone]  (office)  Eliseo Gum Dyshawn Cangelosi 10/11/2023, 5:00 PM

## 2023-10-11 NOTE — Progress Notes (Signed)
Heart Failure Navigator Progress Note  Assessed for Heart & Vascular TOC clinic readiness.  Patient does not meet criteria due to Advanced Heart Failure Team patient of Dr. McLean.   Navigator will sign off at this time.   Mahreen Schewe, BSN, RN Heart Failure Nurse Navigator Secure Chat Only   

## 2023-10-11 NOTE — Progress Notes (Signed)
Mobility Specialist Progress Note:   10/11/23 1015  Mobility  Activity Transferred to/from [facility]  Level of Assistance Contact guard assist, steadying assist  Assistive Device Front wheel walker;BSC  Distance Ambulated (ft) 5 ft  Activity Response Tolerated well  Mobility Referral Yes  $Mobility charge 1 Mobility  Mobility Specialist Start Time (ACUTE ONLY) 1015  Mobility Specialist Stop Time (ACUTE ONLY) 1025  Mobility Specialist Time Calculation (min) (ACUTE ONLY) 10 min   Pt received on BSC, requesting to get into chair. Required minG during transfer. Pt unable to bear much weight through R foot d/t wound. Pt left in chair with all needs met.   Addison Lank Mobility Specialist Please contact via SecureChat or  Rehab office at [phone]

## 2023-10-11 NOTE — Assessment & Plan Note (Signed)
Continue with levothyroxine  

## 2023-10-11 NOTE — Assessment & Plan Note (Signed)
No signs of acute decompensation. Continue supportive medical care.

## 2023-10-11 NOTE — Assessment & Plan Note (Addendum)
Patient with improvement in volume status, her renal function has a serum cr of 1,28 with K at 4,2 and serum bicarbonate at 28  Na 137 and Mg 2.3   Continue diuresis with torsemide and spironolactone.  Metolazone as needed.  Will need outpatient close follow up on renal function and electrolytes.

## 2023-10-11 NOTE — Assessment & Plan Note (Addendum)
Bilateral wounds lower extremities.  No signs of local infection, plan to continue wound care and follow up with vascular surgery. Yesterday patient had fever spike, but so far today she has been afebrile.   Patient did not require antibiotic therapy during this hospitalization.  07/2023 sp right lower extremity femoropopliteal angioplasty and stenting.  Continue outpatient follow up with wound care clinic.   Patient has been wheelchair bound, apparently she lost her rolling walker (will prescribe walker for home use).  Continue outpatient PT and OT.

## 2023-10-11 NOTE — Evaluation (Signed)
Occupational Therapy Evaluation Patient Details Name: [name] MRN: [id number] DOB: August 24, 1961 Today's Date: 10/11/2023   History of Present Illness Pt is a 62 y/o female admitted 11/26 from her vascular surgeons office, found to be symptomatic with worsening edema, SOB and elevated glucose.  PMH significant for afib on xarelto, DM, HTN, PAD s/p R TMA, COPD, CKD stage IIIb, gout, right foot ulcer, carotid stenosis.   Clinical Impression   Pt admitted for above, she presents as generally weak BUEs/BLEs. At baseline she pivots into her w/c, pt demonstrating ability to pivot transfer with supervision assist. Pt recalls she needs to take daily meds and limit her sodium and fluid intake with her CHF dx. Reinforced education on it and the benefits of weighing self daily. OT to continue to follow pt acutely at a low frequency to help pt transition to next level of care and reduce risk of decline. Anticipate no post acute OT needed.       If plan is discharge home, recommend the following: Assistance with cooking/housework    Functional Status Assessment  Patient has had a recent decline in their functional status and/or demonstrates limited ability to make significant improvements in function in a reasonable and predictable amount of time  Equipment Recommendations  None recommended by OT (Pt has rec DME)    Recommendations for Other Services       Precautions / Restrictions Precautions Precautions: Fall Precaution Comments: RLE wound Restrictions Weight Bearing Restrictions: No      Mobility Bed Mobility               General bed mobility comments: up in the chair on arrival    Transfers Overall transfer level: Needs assistance Equipment used: None Transfers: Sit to/from Stand Sit to Stand: Supervision                  Balance Overall balance assessment: Needs assistance Sitting-balance support: No upper extremity supported, Feet supported Sitting  balance-Leahy Scale: Good Sitting balance - Comments: in recliner   Standing balance support: No upper extremity supported Standing balance-Leahy Scale: Fair Standing balance comment: static standing no UE support                           ADL either performed or assessed with clinical judgement   ADL Overall ADL's : Needs assistance/impaired Eating/Feeding: Independent;Sitting   Grooming: Sitting;Set up   Upper Body Bathing: Sitting;Set up   Lower Body Bathing: Sitting/lateral leans;Supervison/ safety;Set up Lower Body Bathing Details (indicate cue type and reason): uses figure four technique Upper Body Dressing : Sitting;Set up   Lower Body Dressing: Supervision/safety;Set up;Sitting/lateral leans Lower Body Dressing Details (indicate cue type and reason): doff/don socks using figure four technique Toilet Transfer: Supervision/safety;Stand-pivot Statistician Details (indicate cue type and reason): simulated from recliner Toileting- Clothing Manipulation and Hygiene: Supervision/safety;Sitting/lateral lean         General ADL Comments: Reinforced education on CHF exacerbation signs and prevention strategies, discussed the benefits of owning a scale to weigh self daily     Vision         Perception         Praxis         Pertinent Vitals/Pain Pain Assessment Pain Assessment: Faces Faces Pain Scale: Hurts a little bit Pain Location: R foot when standing Pain Descriptors / Indicators: Sore, Discomfort, Grimacing, Guarding Pain Intervention(s): Monitored during session, Repositioned     Extremity/Trunk Assessment Upper  Extremity Assessment Upper Extremity Assessment: Generalized weakness   Lower Extremity Assessment Lower Extremity Assessment: Generalized weakness   Cervical / Trunk Assessment Cervical / Trunk Assessment: Kyphotic   Communication Communication Communication: No apparent difficulties   Cognition Arousal: Alert Behavior  During Therapy: WFL for tasks assessed/performed Overall Cognitive Status: Within Functional Limits for tasks assessed                                 General Comments: Pt recalls diet restrictions associate with CHF     General Comments  VSS    Exercises     Shoulder Instructions      Home Living Family/patient expects to be discharged to:: Private residence Living Arrangements: Other relatives (sister) Available Help at Discharge: Family;Available 24 hours/day Type of Home: Apartment Home Access: Level entry     Home Layout: One level     Bathroom Shower/Tub: Chief Strategy Officer: Handicapped height Bathroom Accessibility: Yes How Accessible: Accessible via wheelchair Home Equipment: Shower seat;Cane - quad;Wheelchair - manual   Additional Comments: Pt lives with sister. Pt reports she has an Psychiatrist chair??      Prior Functioning/Environment Prior Level of Function : Needs assist             Mobility Comments: pivots to w/c ADLs Comments: sister cooks and cleans; pt typically able to perform ADL mod I        OT Problem List: Decreased knowledge of precautions;Decreased strength;Increased edema;Cardiopulmonary status limiting activity      OT Treatment/Interventions: Self-care/ADL training;Balance training;Therapeutic exercise;Therapeutic activities;Patient/family education    OT Goals(Current goals can be found in the care plan section) Acute Rehab OT Goals Patient Stated Goal: To get better, go home OT Goal Formulation: With patient Time For Goal Achievement: 10/25/23 Potential to Achieve Goals: Good ADL Goals Pt Will Transfer to Toilet: with modified independence;stand pivot transfer;bedside commode Pt Will Perform Toileting - Clothing Manipulation and hygiene: with modified independence;sit to/from stand Additional ADL Goal #1: pt will verbalize understanding of at least 3 symptoms associated with CHF without  cueing Additional ADL Goal #2: pt will verbalize understanding of at least 3 stratgies to reduce to risk of CHF exacerbation  OT Frequency: Min 1X/week    Co-evaluation              AM-PAC OT "6 Clicks" Daily Activity     Outcome Measure Help from another person eating meals?: None Help from another person taking care of personal grooming?: A Little Help from another person toileting, which includes using toliet, bedpan, or urinal?: A Little Help from another person bathing (including washing, rinsing, drying)?: A Little Help from another person to put on and taking off regular upper body clothing?: A Little Help from another person to put on and taking off regular lower body clothing?: A Little 6 Click Score: 19   End of Session Nurse Communication: Mobility status (pivots)  Activity Tolerance: Patient tolerated treatment well Patient left: in chair;with call bell/phone within reach  OT Visit Diagnosis: Unsteadiness on feet (R26.81);Muscle weakness (generalized) (M62.81)                Time: 1610-9604 OT Time Calculation (min): 15 min Charges:  OT General Charges $OT Visit: 1 Visit OT Evaluation $OT Eval Moderate Complexity: 1 Mod  10/11/2023  AB, OTR/L  Acute Rehabilitation Services  Office: [phone]   Tristan Schroeder 10/11/2023, 5:58 PM
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
## 2023-10-11 NOTE — Assessment & Plan Note (Signed)
No signs of acute exacerbation Continue with bronchodilator therapy.

## 2023-10-11 NOTE — Assessment & Plan Note (Addendum)
Uncontrolled with hyperglycemia.   Patient was placed on basal, pre meal and insulin sliding scale for glucose cover and monitoring.  At the time of her discharge she will continue with her insulin regimen.  Follow up as outpatient.   Continue with statin therapy.

## 2023-10-12 DIAGNOSIS — I480 Paroxysmal atrial fibrillation: Secondary | ICD-10-CM | POA: Diagnosis not present

## 2023-10-12 DIAGNOSIS — N1832 Chronic kidney disease, stage 3b: Secondary | ICD-10-CM | POA: Diagnosis not present

## 2023-10-12 DIAGNOSIS — I5033 Acute on chronic diastolic (congestive) heart failure: Secondary | ICD-10-CM | POA: Diagnosis not present

## 2023-10-12 DIAGNOSIS — I2510 Atherosclerotic heart disease of native coronary artery without angina pectoris: Secondary | ICD-10-CM | POA: Diagnosis not present

## 2023-10-12 LAB — CBC
HCT: 25.4 % — ABNORMAL LOW (ref 36.0–46.0)
Hemoglobin: 7.6 g/dL — ABNORMAL LOW (ref 12.0–15.0)
MCH: 27 pg (ref 26.0–34.0)
MCHC: 29.9 g/dL — ABNORMAL LOW (ref 30.0–36.0)
MCV: 90.1 fL (ref 80.0–100.0)
Platelets: 244 10*3/uL (ref 150–400)
RBC: 2.82 MIL/uL — ABNORMAL LOW (ref 3.87–5.11)
RDW: 15.2 % (ref 11.5–15.5)
WBC: 8.1 10*3/uL (ref 4.0–10.5)
nRBC: 0 % (ref 0.0–0.2)

## 2023-10-12 LAB — IRON AND TIBC
Iron: 18 ug/dL — ABNORMAL LOW (ref 28–170)
Saturation Ratios: 6 % — ABNORMAL LOW (ref 10.4–31.8)
TIBC: 301 ug/dL (ref 250–450)
UIBC: 283 ug/dL

## 2023-10-12 LAB — GLUCOSE, CAPILLARY
Glucose-Capillary: 230 mg/dL — ABNORMAL HIGH (ref 70–99)
Glucose-Capillary: 225 mg/dL — ABNORMAL HIGH (ref 70–99)
Glucose-Capillary: 207 mg/dL — ABNORMAL HIGH (ref 70–99)
Glucose-Capillary: 188 mg/dL — ABNORMAL HIGH (ref 70–99)

## 2023-10-12 LAB — BASIC METABOLIC PANEL
Anion gap: 9 (ref 5–15)
BUN: 59 mg/dL — ABNORMAL HIGH (ref 6–20)
CO2: 27 mmol/L (ref 22–32)
Calcium: 8.1 mg/dL — ABNORMAL LOW (ref 8.9–10.3)
Chloride: 98 mmol/L (ref 98–111)
Creatinine, Ser: 1.48 mg/dL — ABNORMAL HIGH (ref 0.44–1.00)
GFR, Estimated: 40 mL/min — ABNORMAL LOW (ref >60)
Glucose, Bld: 260 mg/dL — ABNORMAL HIGH (ref 70–99)
Potassium: 4.2 mmol/L (ref 3.5–5.1)
Sodium: 134 mmol/L — ABNORMAL LOW (ref 135–145)

## 2023-10-12 LAB — FERRITIN: Ferritin: 39 ng/mL (ref 11–307)

## 2023-10-12 LAB — TRANSFERRIN: Transferrin: 204 mg/dL (ref 192–382)

## 2023-10-12 MED ORDER — ALUM & MAG HYDROXIDE-SIMETH 200-200-20 MG/5ML PO SUSP
30.0000 mL | ORAL | Status: DC | PRN
  Administered 2023-10-12 – 2023-10-13 (×3): 30 mL via ORAL
  Filled 2023-10-12 (×3): qty 30

## 2023-10-12 MED ORDER — SODIUM CHLORIDE 0.9 % IV SOLN
200.0000 mg | Freq: Once | INTRAVENOUS | Status: AC
  Administered 2023-10-12: 200 mg via INTRAVENOUS
  Filled 2023-10-12: qty 10

## 2023-10-12 MED ORDER — IRON SUCROSE 200 MG IVPB - SIMPLE MED
200.0000 mg | Freq: Once | Status: DC
Start: 2023-10-12 — End: 2023-10-12
  Filled 2023-10-12: qty 110

## 2023-10-12 MED ORDER — LOSARTAN POTASSIUM 25 MG PO TABS
25.0000 mg | ORAL_TABLET | Freq: Every day | ORAL | Status: DC
  Administered 2023-10-12 – 2023-10-13 (×2): 25 mg via ORAL
  Filled 2023-10-12 (×2): qty 1

## 2023-10-12 NOTE — Progress Notes (Signed)
Progress Note   Patient: [name]:[id number] DOB: [birth year] DOA: 10/10/2023     1 DOS: the patient was seen and examined on 10/12/2023   Brief hospital course: [name] was admitted to the hospital with the working diagnosis of heart failure exacerbation.   62 yo female with the past medical history of hypertension, dyslipidemia, paroxysmal atrial fibrillation, coronary artery disease, CKD, and COPD who presented with dyspnea and edema. Patient was referred from the vascular surgery office to the ED due to signs of volume overload. Recent ED evaluation 3 days prior to admission for hyperglycemia and acute on chronic edema. Patient received diuretic therapy and was discharged home. On her initial physical examination her blood pressure was 140/45, HR 71, RR 18 and 02 saturation 100%, lungs with no wheezing but positive rales, heart with S1 and S2 present and regular, abdomen with no distention and positive lower extremity edema. Positive wounds right lower extremity edema.   Na 139, K 4,8 Cl 102 bicarbonate 26, glucose 335, bun 54, cr 1,45  BNP 1,190  High sensitive troponin 24 and 27  Wbc 10,8 hgb 8,7 plt 329   Chest radiograph with hypoinflation, bilateral hilar  congestion with mild cephalization of the vasculature.   EKG 77 bpm, normal axis, normal intervals, sinus rhythm with no significant ST segment or T wave changes.   Patient has been placed on furosemide for diuresis.   11/28 patient responding well to diuresis.   Assessment and Plan: * Acute on chronic diastolic CHF (congestive heart failure) ([name]) Echocardiogram with mild reduction in LV systolic function with EF 45 to 50%, mild concentric left ventricular hypertrophy, RV systolic function is preserved, RVSP 50 mmHg, LA with mild dilatation. LV with antero lateral wall and apical lateral segment hypokinetic.   Urine output is 1,750 ml Systolic blood pressure 140 to 150 mmHg.   Patient had one dose of IV  furosemide this morning 60 mg, will transition to po in am.  Continue metoprolol 25 mg daily (succinate). Will discontinue SGLT 2 inh due to history of severe urinary tract infections.  Continue spironolactone and will resume losartan.   PAF (paroxysmal atrial fibrillation) ([name]) Patient is on sinus rhythm Had direct current cardioversion during her last hospitalization on 09/26/23.   Plan to continue rate control with metoprolol and anticoagulation with apixaban.   Stage 3b chronic kidney disease ([name]) Volume status has improved, renal function today with serum cr at 1,48 with K at 4,2 and serum bicarbonate at 27  Na 134   Continue with furosemide,and low dose spironolactone, close follow up on electrolytes and renal function. Follow up renal function in am.   Coronary artery disease Troponin elevation due to heart failure, ruled out acute coronary syndrome.  Continue statin therapy.   Anemia of chronic disease Follow up Hgb is 7.7  Iron panel with serum iron 18, TIBC 301, transferrin saturation 6 and ferritin 39. Consistent with anemia of iron deficiency.   Will add IV iron infusion today.   Essential hypertension Continue metoprolol and will add low dose losartan.    Type 2 diabetes mellitus with hyperlipidemia ([name]) Uncontrolled with hyperglycemia.  Fasting glucose today 260, capillary 228, 230 and 225   Will increase basal insulin to 20 units and will continue with pre meal 5 units. Plus sliding scale.  Continue with statin therapy.   PAD (peripheral artery disease) ([name]) Bilateral wounds lower extremities.  No signs of local infection, plan to continue wound care and follow up  with vascular surgery. Yesterday patient had fever spike, but so far today [name] has been afebrile.   Hold on antibiotic therapy for now.   Patient has been wheelchair bound, apparently [name] lost her rolling walker.  Will consult PT and Ot.   COPD (chronic obstructive pulmonary disease)  ([name]) No signs of acute exacerbation. Continue with bronchodilator therapy.   Alcoholic cirrhosis ([name]) No signs of acute decompensation. Continue supportive medical care.   Obesity, class 2 Calculated BMI is 38.8   Depression Continue with bupropion.   Hypothyroidism Continue with levothyroxine         Subjective: Patient with improvement in dyspnea and edema, no chest pain and no dyspnea. [name] lives with her sister.   Physical Exam: Vitals:   10/12/23 0309 10/12/23 0314 10/12/23 0902 10/12/23 1119  BP: (!) 152/65  (!) 146/53 (!) 143/62  Pulse: 79  80 65  Resp: 18  15 15   Temp: 97.9 F (36.6 C)  97.9 F (36.6 C) 98 F (36.7 C)  TempSrc: Oral  Oral Oral  SpO2: 93%  96% 93%  Weight:  102.8 kg    Height:       Neurology awake and alert ENT With mild pallor Cardiovascular with S1 and S2 present and regular with no gallops, rubs or murmurs Respiratory with no rales or wheezing, no rhonchi Abdomen with no distention  Lower extremity with trace edema  Data Reviewed:    Family Communication: no family at the bedside   Disposition: Status is: Inpatient Remains inpatient appropriate because: heart failure   Planned Discharge Destination: Home  Author: Coralie Keens, MD 10/12/2023 2:58 PM  For on call review [email or web address].

## 2023-10-12 NOTE — Progress Notes (Signed)
Mobility Specialist Progress Note:    10/12/23 1000  Mobility  Activity Ambulated with assistance in room  Level of Assistance Contact guard assist, steadying assist  Assistive Device Front wheel walker  Distance Ambulated (ft) 8 ft  Activity Response Tolerated well  Mobility Referral Yes  $Mobility charge 1 Mobility  Mobility Specialist Start Time (ACUTE ONLY) 0940  Mobility Specialist Stop Time (ACUTE ONLY) [id number]  Mobility Specialist Time Calculation (min) (ACUTE ONLY) 12 min   Pt received in chair agreeable to mobility. No physical assistance needed throughout session. Was able to take 4 steps forwards and backwards as well as x2 STS. No c/o throughout. Call bell and personal belongings in reach.   Thompson Grayer Mobility Specialist  Please contact vis Secure Chat or  Rehab Office [phone]

## 2023-10-13 ENCOUNTER — Other Ambulatory Visit (HOSPITAL_COMMUNITY): Payer: Self-pay

## 2023-10-13 DIAGNOSIS — E66812 Obesity, class 2: Secondary | ICD-10-CM

## 2023-10-13 DIAGNOSIS — E1169 Type 2 diabetes mellitus with other specified complication: Secondary | ICD-10-CM

## 2023-10-13 DIAGNOSIS — I5033 Acute on chronic diastolic (congestive) heart failure: Secondary | ICD-10-CM | POA: Diagnosis not present

## 2023-10-13 DIAGNOSIS — E785 Hyperlipidemia, unspecified: Secondary | ICD-10-CM

## 2023-10-13 DIAGNOSIS — I2510 Atherosclerotic heart disease of native coronary artery without angina pectoris: Secondary | ICD-10-CM | POA: Diagnosis not present

## 2023-10-13 DIAGNOSIS — I480 Paroxysmal atrial fibrillation: Secondary | ICD-10-CM | POA: Diagnosis not present

## 2023-10-13 DIAGNOSIS — N1832 Chronic kidney disease, stage 3b: Secondary | ICD-10-CM | POA: Diagnosis not present

## 2023-10-13 LAB — BASIC METABOLIC PANEL
Anion gap: 9 (ref 5–15)
BUN: 50 mg/dL — ABNORMAL HIGH (ref 6–20)
CO2: 28 mmol/L (ref 22–32)
Calcium: 8.1 mg/dL — ABNORMAL LOW (ref 8.9–10.3)
Chloride: 100 mmol/L (ref 98–111)
Creatinine, Ser: 1.28 mg/dL — ABNORMAL HIGH (ref 0.44–1.00)
GFR, Estimated: 48 mL/min — ABNORMAL LOW (ref >60)
Glucose, Bld: 203 mg/dL — ABNORMAL HIGH (ref 70–99)
Potassium: 4.2 mmol/L (ref 3.5–5.1)
Sodium: 137 mmol/L (ref 135–145)

## 2023-10-13 LAB — GLUCOSE, CAPILLARY: Glucose-Capillary: 222 mg/dL — ABNORMAL HIGH (ref 70–99)

## 2023-10-13 LAB — MAGNESIUM: Magnesium: 2.3 mg/dL (ref 1.7–2.4)

## 2023-10-13 MED ORDER — METOLAZONE 2.5 MG PO TABS: 2.5000 mg | ORAL_TABLET | ORAL | Status: DC

## 2023-10-13 MED ORDER — SPIRONOLACTONE 25 MG PO TABS
12.5000 mg | ORAL_TABLET | Freq: Every day | ORAL | 0 refills | Status: DC
  Filled 2023-10-13: qty 15, 30d supply, fill #0

## 2023-10-13 MED ORDER — LOSARTAN POTASSIUM 25 MG PO TABS
25.0000 mg | ORAL_TABLET | Freq: Every day | ORAL | 0 refills | Status: DC
  Filled 2023-10-13: qty 30, 30d supply, fill #0

## 2023-10-13 MED ORDER — TORSEMIDE 100 MG PO TABS
100.0000 mg | ORAL_TABLET | Freq: Two times a day (BID) | ORAL | Status: DC
  Administered 2023-10-13: 100 mg via ORAL
  Filled 2023-10-13: qty 1

## 2023-10-13 NOTE — TOC Transition Note (Signed)
Transition of Care [facility]) - CM/SW Discharge Note   Patient Details  Name: [name] MRN: [id number] Date of Birth: 12/06/1960  [facility]) CM/SW Contact:  Kermit Balo, RN Phone Number: 10/13/2023, 10:17 AM   Clinical Narrative:     Pt is discharging home with resumption of home health services through [facility]. Information on the AVS. [facility] will contact her for the first home visit after d/c.  Pt has needed DME at home. Pt's sister to provide transportation home.  Final next level of care: Home w Home Health Services Barriers to Discharge: No Barriers Identified   Patient Goals and CMS Choice      Discharge Placement                         Discharge Plan and Services Additional resources added to the After Visit Summary for   In-house Referral: Clinical Social Work              DME Arranged: Gilmer Mor, Government social research officer         HH Arranged: PT, OT, RN          Social Determinants of Health (SDOH) Interventions SDOH Screenings   Food Insecurity: No Food Insecurity (10/10/2023)  Housing: Low Risk  (10/10/2023)  Transportation Needs: No Transportation Needs (10/10/2023)  Utilities: Not At Risk (10/10/2023)  Alcohol Screen: Low Risk  (01/28/2020)  Depression (PHQ2-9): Low Risk  (02/09/2023)  Financial Resource Strain: Low Risk  (02/09/2023)  Physical Activity: Inactive (02/09/2023)  Social Connections: Unknown (08/21/2023)   Received from [facility]  Stress: No Stress Concern Present (02/09/2023)  Tobacco Use: High Risk (10/10/2023)     Readmission Risk Interventions    09/27/2023    3:52 PM  Readmission Risk Prevention Plan  Transportation Screening Complete  Medication Review (RN Care Manager) Referral to Pharmacy  PCP or Specialist appointment within 3-5 days of discharge Complete  HRI or Home Care Consult Complete  SW Recovery Care/Counseling Consult Complete  Palliative Care Screening Not Applicable  Skilled Nursing  Facility Not Applicable

## 2023-10-13 NOTE — Discharge Summary (Addendum)
Physician Discharge Summary   Patient: [name] MRN: [id number] DOB: 1961/04/08  Admit date:     10/10/2023  Discharge date: 10/13/23  Discharge Physician: [name]   PCP: Karla Register, MD   Recommendations at discharge:    Patient has tolerated well losartan and spironolactone, will continue this two agents with close follow up as outpatient on electrolytes.  Hold on K supplementation due to reported tendency to have high potassium in the past, (and now on spironolactone and losartan).  Continue diuresis with torsemide 100 mg bid  Change metolazone to take only as needed once weekly, for signs of volume overload, weight gain 2 to 3 lbs in 24 hrs or 5 lbs in 7 days.  Follow up renal function and electrolytes in 7 days.  Follow up with Dr Odette Horns in 7 to 10 days.  Follow up with Cardiology, heart failure clinic, as scheduled. (Will need a earlier appointment).   Discharge Diagnoses: Principal Problem:   Acute on chronic diastolic CHF (congestive heart failure) ([name]) Active Problems:   PAF (paroxysmal atrial fibrillation) ([name])   Stage 3b chronic kidney disease ([name])   Coronary artery disease   Anemia of chronic disease   Essential hypertension   Type 2 diabetes mellitus with hyperlipidemia ([name])   PAD (peripheral artery disease) ([name])   COPD (chronic obstructive pulmonary disease) ([name])   Alcoholic cirrhosis ([name])   Obesity, class 2   Depression   Hypothyroidism  Resolved Problems:   * No resolved hospital problems. [facility] Course: [name] was admitted to the hospital with the working diagnosis of heart failure exacerbation.   62 yo female with the past medical history of hypertension, dyslipidemia, paroxysmal atrial fibrillation, coronary artery disease, CKD, and COPD who presented with dyspnea and edema. Patient was referred from the vascular surgery office to the ED due to signs of volume overload. Recent ED evaluation 3 days prior to  admission for hyperglycemia and acute on chronic edema. Patient received diuretic therapy and was discharged home. On her initial physical examination on the current admission her blood pressure was 140/45, HR 71, RR 18 and 02 saturation 100%, lungs with no wheezing but positive rales, heart with S1 and S2 present and regular, abdomen with no distention and positive lower extremity edema. Positive wounds right lower extremity edema.   Na 139, K 4,8 Cl 102 bicarbonate 26, glucose 335, bun 54, cr 1,45  BNP 1,190  High sensitive troponin 24 and 27  Wbc 10,8 hgb 8,7 plt 329   Chest radiograph with hypoinflation, bilateral hilar  congestion with mild cephalization of the vasculature.   EKG 77 bpm, normal axis, normal intervals, sinus rhythm with no significant ST segment or T wave changes.   Patient has been placed on furosemide for diuresis.   11/28 patient responding well to diuresis.  11/29 patient will follow up with primary care and cardiology, she has been advised to be adherent to a cardiac diet with salt and fluid restrictions.   Assessment and Plan: * Acute on chronic diastolic CHF (congestive heart failure) ([name]) Echocardiogram with mild reduction in LV systolic function with EF 45 to 50%, mild concentric left ventricular hypertrophy, RV systolic function is preserved, RVSP 50 mmHg, LA with mild dilatation. LV with antero lateral wall and apical lateral segment hypokinetic.   Patient was placed on IV furosemide for diuresis, negative fluid balance was achieved, with significant improvement in her symptoms. Patient lost 6 Kg during this hospitalization.  At the time of her discharge she will resume torsemide 100 mg bid.  Continue metoprolol 25 mg daily (succinate). Resumed spironolactone and losartan with good toleration.  No SGLT 2 inh due to history of severe urinary tract infections.  Will use metolazone only as needed for now until follow up with heart failure clinic.  Patient  has a Cardiomems in place.   Plan to follow up with heart failure clinic as outpatient.    PAF (paroxysmal atrial fibrillation) ([name]) Patient is on sinus rhythm Had direct current cardioversion during her last hospitalization on 09/26/23.   Plan to continue rate control with metoprolol and anticoagulation with apixaban.   Stage 3b chronic kidney disease ([name]) Patient with improvement in volume status, her renal function has a serum cr of 1,28 with K at 4,2 and serum bicarbonate at 28  Na 137 and Mg 2.3   Continue diuresis with torsemide and spironolactone.  Metolazone as needed.  Will need outpatient close follow up on renal function and electrolytes.   Coronary artery disease Troponin elevation due to heart failure, ruled out acute coronary syndrome.  Continue statin therapy.   Anemia of chronic disease Follow up Hgb is 7.6  Iron panel with serum iron 18, TIBC 301, transferrin saturation 6 and ferritin 39. Consistent with anemia of iron deficiency.   SP IV iron infusion 200 mg of iron sucrose.  Follow up cell count and iron stores as outpatient.   Essential hypertension Continue metoprolol and losartan.    Type 2 diabetes mellitus with hyperlipidemia ([name]) Uncontrolled with hyperglycemia.   Patient was placed on basal, pre meal and insulin sliding scale for glucose cover and monitoring.  At the time of her discharge she will continue with her insulin regimen.  Follow up as outpatient.   Continue with statin therapy.   PAD (peripheral artery disease) ([name]) Bilateral wounds lower extremities.  No signs of local infection, plan to continue wound care and follow up with vascular surgery. Yesterday patient had fever spike, but so far today she has been afebrile.   Patient did not require antibiotic therapy during this hospitalization.  07/2023 sp right lower extremity femoropopliteal angioplasty and stenting.  Continue outpatient follow up with wound care clinic.    Patient has been wheelchair bound, apparently she lost her rolling walker (will prescribe walker for home use).  Continue outpatient PT and OT.   COPD (chronic obstructive pulmonary disease) ([name]) No signs of acute exacerbation. Continue with bronchodilator therapy.   Alcoholic cirrhosis ([name]) No signs of acute decompensation. Continue supportive medical care.   Obesity, class 2 Calculated BMI is 38.8   Depression Continue with bupropion.   Hypothyroidism Continue with levothyroxine          Consultants: none  Procedures performed: none   Disposition: Home Diet recommendation:  Discharge Diet Orders (From admission, onward)     Start     Ordered   10/13/23 0000  Diet - low sodium heart healthy        10/13/23 0938           Cardiac diet DISCHARGE MEDICATION: Allergies as of 10/13/2023       Reactions   Gabapentin Nausea And Vomiting, Other (See Comments)   POSSIBLE SHAKING   Lyrica [pregabalin] Other (See Comments)   Shaking        Medication List     STOP taking these medications    potassium chloride SA 20 MEQ tablet Commonly known as: Klor-Con M20  TAKE these medications    Accu-Chek Guide test strip Generic drug: glucose blood USE TO CHECK BLOOD SUGAR FOUR TIMES DAILY   acetaminophen 500 MG tablet Commonly known as: TYLENOL Take 1,000 mg by mouth every 6 (six) hours as needed for headache (pain).   albuterol 108 (90 Base) MCG/ACT inhaler Commonly known as: VENTOLIN HFA USE 2 PUFFS BY MOUTH EVERY FOUR HOURS, AS NEEDED, FOR COUGHING/WHEEZING   apixaban 5 MG Tabs tablet Commonly known as: Eliquis Take 1 tablet (5 mg total) by mouth 2 (two) times daily.   budesonide-formoterol 160-4.5 MCG/ACT inhaler Commonly known as: Symbicort Inhale 2 puffs into the lungs 2 (two) times daily.   buPROPion ER 100 MG 12 hr tablet Commonly known as: Wellbutrin SR Take 1 tablet (100 mg total) by mouth daily.   colchicine 0.6 MG  tablet Take 1.2 mg by mouth See admin instructions. Take 1.2 mg for flair up and an hour later take 0.6 mg if needed   Easy Comfort Pen Needles 31G X 5 MM Misc Generic drug: Insulin Pen Needle USE FOUR TIMES A DAY FOR INSULIN ADMINISTRATION   fluticasone 50 MCG/ACT nasal spray Commonly known as: FLONASE Place 1 spray into both nostrils as needed for allergies or rhinitis.   insulin lispro 100 UNIT/ML KwikPen Commonly known as: HUMALOG Inject 10 Units into the skin 3 (three) times daily with meals.   Lantus SoloStar 100 UNIT/ML Solostar Pen Generic drug: insulin glargine Inject 37 Units into the skin daily.   levothyroxine 50 MCG tablet Commonly known as: SYNTHROID TAKE 1 TABLET (50 MCG TOTAL) BY MOUTH DAILY BEFORE BREAKFAST (AM)   losartan 25 MG tablet Commonly known as: COZAAR Take 1 tablet (25 mg total) by mouth daily.   metolazone 2.5 MG tablet Commonly known as: ZAROXOLYN Take 1 tablet (2.5 mg total) by mouth as directed. Take once weekly as needed in case of weight gain 2 to 3 lbs in 24 hrs or 5 lbs in 7 days. What changed:  when to take this additional instructions   metoprolol succinate 25 MG 24 hr tablet Commonly known as: TOPROL-XL Take 1 tablet (25 mg total) by mouth daily.   pantoprazole 40 MG tablet Commonly known as: PROTONIX TAKE 1 TABLET (40 MG TOTAL) BY MOUTH DAILY(AM)   polyethylene glycol 17 g packet Commonly known as: MIRALAX / GLYCOLAX Take 17 g by mouth daily as needed for moderate constipation.   rosuvastatin 40 MG tablet Commonly known as: CRESTOR TAKE 1 TABLET (40 MG TOTAL) BY MOUTH DAILY (BEDTIME)   spironolactone 25 MG tablet Commonly known as: ALDACTONE Take 0.5 tablets (12.5 mg total) by mouth daily.   torsemide 100 MG tablet Commonly known as: DEMADEX Take 1 tablet (100 mg total) by mouth 2 (two) times daily.   triamcinolone ointment 0.1 % Commonly known as: KENALOG Apply topically 2 (two) times daily.   VITAMIN D3 PO Take 1  capsule by mouth every morning.   VITAMIN E PO Take 1 tablet by mouth daily.               Discharge Care Instructions  (From admission, onward)           Start     Ordered   10/13/23 0000  Discharge wound care:       Comments: Wound care  Until discontinued      Comments: Left leg: Foam dressing, Change each 3 days. Right leg: Xeroform and gauze. Bandage on both legs, not tight. On the full  thickness below the right foot: cover with foam dressing. Keep dry and clean   10/13/23 0938            Discharge Exam: Filed Weights   10/11/23 0338 10/12/23 0314 10/13/23 0413  Weight: 102.6 kg 102.8 kg 101.7 kg   Patient is feeling better, her dyspnea and edema have improved, no chest pain, orthopnea or PND.   Neurology awake and alert ENT with mild pallor  Cardiovascular with S1 and S2 present and regular with no gallops, rubs or murmurs No JVD Trace lower extremity edema Respiratory with no rales or wheezing with scattered rhonchi and prolonged expiratory phase Abdomen with no distention  Wounds with dressing in place.          Condition at discharge: stable  The results of significant diagnostics from this hospitalization (including imaging, microbiology, ancillary and laboratory) are listed below for reference.   Imaging Studies: DG Chest 2 View  Result Date: 10/10/2023 CLINICAL DATA:  Cough. EXAM: CHEST - 2 VIEW COMPARISON:  Chest radiograph from 10/07/2023 and chest CT scan from 06/16/2023. FINDINGS: Bilateral lung fields are clear. Bilateral costophrenic angles are clear. Stable cardio-mediastinal silhouette. No acute osseous abnormalities. The soft tissues are within normal limits. There is an approximately 1 cm long metallic linear structure overlying the left posterior mediastinum, which corresponds to foreign body in the left lung lower lobe pulmonary vessel when compared to the prior CT scan from 06/16/2023. IMPRESSION: *No active cardiopulmonary  disease. *Metallic foreign body in the left lung lower lobe pulmonary vessel, when correlated with prior chest CT scan. Electronically Signed   By: Jules Schick M.D.   On: 10/10/2023 14:03   DG Chest Port 1 View  Result Date: 10/07/2023 CLINICAL DATA:  Weakness.  Bilateral lower extremity edema. EXAM: PORTABLE CHEST 1 VIEW COMPARISON:  09/23/2023. FINDINGS: Bilateral lung fields are clear. Bilateral costophrenic angles are clear. Stable cardio-mediastinal silhouette. No acute osseous abnormalities. The soft tissues are within normal limits. IMPRESSION: *No active disease. Electronically Signed   By: Jules Schick M.D.   On: 10/07/2023 15:11   ECHOCARDIOGRAM COMPLETE  Result Date: 09/27/2023    ECHOCARDIOGRAM REPORT   Patient Name:   BALBINA GUTTER Date of Exam: 09/27/2023 Medical Rec #:  [id number]     Height:       64.0 in Accession #:    [id number]    Weight:       229.8 lb Date of Birth:  07/18/1961    BSA:          2.075 m Patient Age:    61 years      BP:           126/68 mmHg Patient Gender: F             HR:           76 bpm. Exam Location:  Inpatient Procedure: 2D Echo, Cardiac Doppler, Color Doppler and Intracardiac            Opacification Agent Indications:    Congestive Heart Failure I50.9  History:        Patient has prior history of Echocardiogram examinations, most                 recent 10/01/2020. CHF, CAD, Arrythmias:Atrial Fibrillation;                 Risk Factors:Hypertension.  Sonographer:    Darlys Gales Referring Phys: [id number] SHENG L Weniger IMPRESSIONS  1. Left  ventricular ejection fraction, by estimation, is 45 to 50%. The left ventricle has mildly decreased function. The left ventricle demonstrates regional wall motion abnormalities (see scoring diagram/findings for description). There is mild concentric left ventricular hypertrophy. Left ventricular diastolic parameters are consistent with Grade II diastolic dysfunction (pseudonormalization).  2. Right ventricular systolic  function is normal. The right ventricular size is normal. There is moderately elevated pulmonary artery systolic pressure. The estimated right ventricular systolic pressure is 50.0 mmHg.  3. Left atrial size was mildly dilated.  4. The mitral valve is degenerative. Mild mitral valve regurgitation. No evidence of mitral stenosis.  5. The aortic valve is grossly normal. Aortic valve regurgitation is not visualized. No aortic stenosis is present.  6. The inferior vena cava is dilated in size with >50% respiratory variability, suggesting right atrial pressure of 8 mmHg. Comparison(s): Changes from prior study are noted. EF largely unchanged. Anterolateral HK noted. FINDINGS  Left Ventricle: Left ventricular ejection fraction, by estimation, is 45 to 50%. The left ventricle has mildly decreased function. The left ventricle demonstrates regional wall motion abnormalities. The left ventricular internal cavity size was normal in size. There is mild concentric left ventricular hypertrophy. Left ventricular diastolic parameters are consistent with Grade II diastolic dysfunction (pseudonormalization).  LV Wall Scoring: The antero-lateral wall and apical lateral segment are hypokinetic. Right Ventricle: The right ventricular size is normal. No increase in right ventricular wall thickness. Right ventricular systolic function is normal. There is moderately elevated pulmonary artery systolic pressure. The tricuspid regurgitant velocity is 3.24 m/s, and with an assumed right atrial pressure of 8 mmHg, the estimated right ventricular systolic pressure is 50.0 mmHg. Left Atrium: Left atrial size was mildly dilated. Right Atrium: Right atrial size was normal in size. Pericardium: There is no evidence of pericardial effusion. Mitral Valve: The mitral valve is degenerative in appearance. Mild to moderate mitral annular calcification. Mild mitral valve regurgitation. No evidence of mitral valve stenosis. Tricuspid Valve: The tricuspid  valve is grossly normal. Tricuspid valve regurgitation is mild . No evidence of tricuspid stenosis. Aortic Valve: The aortic valve is grossly normal. Aortic valve regurgitation is not visualized. No aortic stenosis is present. Aortic valve mean gradient measures 5.0 mmHg. Aortic valve peak gradient measures 8.1 mmHg. Aortic valve area, by VTI measures 2.04 cm. Pulmonic Valve: The pulmonic valve was grossly normal. Pulmonic valve regurgitation is not visualized. No evidence of pulmonic stenosis. Aorta: The aortic root is normal in size and structure. Venous: The inferior vena cava is dilated in size with greater than 50% respiratory variability, suggesting right atrial pressure of 8 mmHg. IAS/Shunts: The atrial septum is grossly normal.  LEFT VENTRICLE PLAX 2D LVIDd:         5.30 cm   Diastology LVIDs:         3.90 cm   LV e' medial:    8.16 cm/s LV PW:         1.20 cm   LV E/e' medial:  13.4 LV IVS:        1.20 cm   LV e' lateral:   7.07 cm/s LVOT diam:     1.90 cm   LV E/e' lateral: 15.4 LV SV:         70 LV SV Index:   34 LVOT Area:     2.84 cm  RIGHT VENTRICLE RV S prime:     13.60 cm/s TAPSE (M-mode): 2.6 cm LEFT ATRIUM  Index        RIGHT ATRIUM           Index LA Vol (A2C):   84.6 ml 40.76 ml/m  RA Area:     21.60 cm LA Vol (A4C):   64.0 ml 30.84 ml/m  RA Volume:   64.80 ml  31.22 ml/m LA Biplane Vol: 77.6 ml 37.39 ml/m  AORTIC VALVE AV Area (Vmax):    2.26 cm AV Area (Vmean):   2.05 cm AV Area (VTI):     2.04 cm AV Vmax:           142.00 cm/s AV Vmean:          113.000 cm/s AV VTI:            0.343 m AV Peak Grad:      8.1 mmHg AV Mean Grad:      5.0 mmHg LVOT Vmax:         113.00 cm/s LVOT Vmean:        81.700 cm/s LVOT VTI:          0.247 m LVOT/AV VTI ratio: 0.72 MITRAL VALVE                TRICUSPID VALVE MV Area (PHT): 4.77 cm     TR Peak grad:   42.0 mmHg MV Decel Time: 159 msec     TR Vmax:        324.00 cm/s MV E velocity: 109.00 cm/s MV A velocity: 77.90 cm/s   SHUNTS MV E/A  ratio:  1.40         Systemic VTI:  0.25 m                             Systemic Diam: 1.90 cm Lennie Odor MD Electronically signed by Lennie Odor MD Signature Date/Time: 09/27/2023/2:29:00 PM    Final    DG Chest Port 1 View  Result Date: 09/23/2023 CLINICAL DATA:  Atrial fibrillation and chest pain. EXAM: PORTABLE CHEST 1 VIEW COMPARISON:  July 27, 2023 FINDINGS: The cardiac silhouette is mildly enlarged and unchanged in size. Mild atelectasis is seen within the left lung base. No pleural effusion or pneumothorax is identified. The visualized skeletal structures are unremarkable. IMPRESSION: Mild cardiomegaly with mild left basilar atelectasis. Electronically Signed   By: Aram Candela M.D.   On: 09/23/2023 04:01    Microbiology: Results for orders placed or performed during the hospital encounter of 10/10/23  Resp panel by RT-PCR (RSV, Flu A&B, Covid) Anterior Nasal Swab     Status: None   Collection Time: 10/10/23 12:53 PM   Specimen: Anterior Nasal Swab  Result Value Ref Range Status   SARS Coronavirus 2 by RT PCR NEGATIVE NEGATIVE Final   Influenza A by PCR NEGATIVE NEGATIVE Final   Influenza B by PCR NEGATIVE NEGATIVE Final    Comment: (NOTE) The Xpert Xpress SARS-CoV-2/FLU/RSV plus assay is intended as an aid in the diagnosis of influenza from Nasopharyngeal swab specimens and should not be used as a sole basis for treatment. Nasal washings and aspirates are unacceptable for Xpert Xpress SARS-CoV-2/FLU/RSV testing.  Fact Sheet for Patients: BloggerCourse.com  Fact Sheet for Healthcare Providers: SeriousBroker.it  This test is not yet approved or cleared by the [place] FDA and has been authorized for detection and/or diagnosis of SARS-CoV-2 by FDA under an Emergency Use Authorization (EUA). This EUA will remain in effect (meaning this test can be  used) for the duration of the COVID-19 declaration under Section  564(b)(1) of the Act, 21 U.S.C. section 360bbb-3(b)(1), unless the authorization is terminated or revoked.     Resp Syncytial Virus by PCR NEGATIVE NEGATIVE Final    Comment: (NOTE) Fact Sheet for Patients: BloggerCourse.com  Fact Sheet for Healthcare Providers: SeriousBroker.it  This test is not yet approved or cleared by the [place] FDA and has been authorized for detection and/or diagnosis of SARS-CoV-2 by FDA under an Emergency Use Authorization (EUA). This EUA will remain in effect (meaning this test can be used) for the duration of the COVID-19 declaration under Section 564(b)(1) of the Act, 21 U.S.C. section 360bbb-3(b)(1), unless the authorization is terminated or revoked.  Performed at [facility], [address] [place], [place] [address]    *Note: Due to a large number of results and/or encounters for the requested time period, some results have not been displayed. A complete set of results can be found in Results Review.    Labs: CBC: Recent Labs  Lab 10/07/23 1448 10/10/23 1212 10/10/23 1237 10/11/23 0248 10/12/23 0234  WBC 10.0  --  10.8* 8.9 8.1  NEUTROABS 7.3  --  8.7*  --   --   HGB 7.9* 9.2* 8.7* 7.7* 7.6*  HCT 26.7* 27.0* 29.2* 25.2* 25.4*  MCV 94.3  --  92.7 90.0 90.1  PLT 365  --  329 263 244   Basic Metabolic Panel: Recent Labs  Lab 10/07/23 1448 10/10/23 1202 10/10/23 1212 10/10/23 1237 10/11/23 0248 10/12/23 0234 10/13/23 0238  NA 138 139 138  --  134* 134* 137  K 4.0 4.8 4.7  --  4.5 4.2 4.2  CL 107 102 103  --  98 98 100  CO2 23 26  --   --  27 27 28   GLUCOSE 221* 335* 330*  --  405* 260* 203*  BUN 44* 54* 53*  --  52* 59* 50*  CREATININE 1.29* 1.45* 1.40*  --  1.44* 1.48* 1.28*  CALCIUM 8.5* 9.0  --   --  8.4* 8.1* 8.1*  MG 2.1  --   --  2.0  --   --  2.3   Liver Function Tests: Recent Labs  Lab 10/07/23 1448 10/10/23 1538 10/11/23 0248  AST 12* 16 14*  ALT  24 19 16   ALKPHOS 271* 210* 196*  BILITOT 0.3 0.3 0.4  PROT 6.9 6.9 6.0*  ALBUMIN 2.7* 2.7* 2.4*   CBG: Recent Labs  Lab 10/12/23 0606 10/12/23 1117 10/12/23 1625 10/12/23 2101 10/13/23 0559  GLUCAP 230* 225* 207* 188* 222*    Discharge time spent: greater than 30 minutes.  Signed: Coralie Keens, MD [facility] 10/13/2023

## 2023-10-13 NOTE — Progress Notes (Signed)
PT Cancellation Note  Patient Details Name: ETHELEAN BIANCA MRN: [id number] DOB: Sep 04, 1961   Cancelled Treatment:    Reason Eval/Treat Not Completed: Patient declined, no reason specified Politely declines PT this morning, would like to focus on getting ready to DC/go home instead. She had no questions/concerns for PT today. Thank you for the opportunity to participate in her care!    Nedra Hai, PT, DPT 10/13/23 10:10 AM

## 2023-10-16 ENCOUNTER — Telehealth (HOSPITAL_COMMUNITY): Payer: Self-pay | Admitting: Adult Health

## 2023-10-16 ENCOUNTER — Telehealth: Payer: Self-pay

## 2023-10-16 NOTE — Transitions of Care (Post Inpatient/ED Visit) (Signed)
   10/16/2023  Name: [name] MRN: [id number] DOB: 1961/05/21  Today's TOC FU Call Status: Today's TOC FU Call Status:: Successful TOC FU Call Completed TOC FU Call Complete Date: 10/16/23 Patient's Name and Date of Birth confirmed.  Transition Care Management Follow-up Telephone Call Date of Discharge: 10/13/23 Discharge Facility: [facility]) Type of Discharge: Inpatient Admission Primary Inpatient Discharge Diagnosis:: acute on chronic diastolic CHF How have you been since you were released from the hospital?: Better Any questions or concerns?: No  Items Reviewed: Medications obtained,verified, and reconciled?: Partial Review Completed Reason for Partial Mediation Review: She said she has all of her medications as well as a glucometer and she did not have any questions about the med regime.  She said she did not need to review the list.  Per the patient, Kerry Hough, EMT will be seeing her this week and will review the meds at that time. Any new allergies since your discharge?: No Dietary orders reviewed?: Yes Type of Diet Ordered:: heart Healthy, low sodium, diabetic, She also receives MOW. Do you have support at home?: Yes People in Home: sibling(s) Name of Support/Comfort Primary Source: her sister  Medications Reviewed Today: Medications Reviewed Today   Medications were not reviewed in this encounter     Home Care and Equipment/Supplies: Were Home Health Services Ordered?: Yes Name of Home Health Agency:: [facility] Has Agency set up a time to come to your home?: No EMR reviewed for Home Health Orders:  (the patient will call [facility] to them know that she needs a nurse visit. She has the phone number for the agency. She had dressing on BLE but they  have been removed. She has no dressing supplies and needs to contact the home health nurse for assistance.) Any new equipment or medical supplies ordered?: No  Functional Questionnaire: Do you need assistance with  bathing/showering or dressing?: Yes (Her sister can assist if needed.) Do you need assistance with meal preparation?: Yes (her sister prepares the meals) Do you need assistance with eating?: No Do you have difficulty maintaining continence: No Do you need assistance with getting out of bed/getting out of a chair/moving?: Yes (She said she primarily uses the wheelchair for mobility becasue of the wounds on her legs.  She has a RW but does not use that regularly.) Do you have difficulty managing or taking your medications?: Yes Kerry Hough, EMT assists per patient.)  Follow up appointments reviewed: PCP Follow-up appointment confirmed?: Yes Date of PCP follow-up appointment?: 11/02/23 Follow-up Provider: Corene Cornea, PA Specialist Hospital Follow-up appointment confirmed?: Yes Date of Specialist follow-up appointment?: 10/18/23 Follow-Up Specialty Provider:: CHF clinic, 10/19/2023- wound care. Do you need transportation to your follow-up appointment?: No (She said her sister takes her to the appointments.) Do you understand care options if your condition(s) worsen?: Yes-patient verbalized understanding    SIGNATURE Robyne Peers, RN

## 2023-10-16 NOTE — Telephone Encounter (Signed)
    Cardiomems Remote Monitoring  S/P Cardiomems Implant   PAD Goal: 20 Most recent reading: 37  Recommended changes: Suggestive fluid accumulation   I continue to review and analyze the patients PA pressures weekly (and more often as needed) to bring PA pressures within the optimal range.    She has 4 Furoscix at home.   I personally called and discussed.   I have instructed to apply a Furoscix today and send a reading tomorrow. Ms Morsch verbalized understanding.   Leomar Westberg NP-C  12:16 PM

## 2023-10-17 ENCOUNTER — Emergency Department (HOSPITAL_COMMUNITY)
Admission: EM | Admit: 2023-10-17 | Discharge: 2023-10-17 | Disposition: A | Discharge status: Home / Self Care | Payer: 59 | Attending: Emergency Medicine | Admitting: Emergency Medicine

## 2023-10-17 ENCOUNTER — Other Ambulatory Visit: Payer: Self-pay

## 2023-10-17 ENCOUNTER — Encounter (HOSPITAL_COMMUNITY): Payer: Self-pay | Admitting: Emergency Medicine

## 2023-10-17 ENCOUNTER — Telehealth (HOSPITAL_COMMUNITY): Payer: Self-pay

## 2023-10-17 DIAGNOSIS — L0231 Cutaneous abscess of buttock: Secondary | ICD-10-CM | POA: Insufficient documentation

## 2023-10-17 DIAGNOSIS — Z794 Long term (current) use of insulin: Secondary | ICD-10-CM | POA: Diagnosis not present

## 2023-10-17 DIAGNOSIS — L0291 Cutaneous abscess, unspecified: Secondary | ICD-10-CM

## 2023-10-17 DIAGNOSIS — Z79899 Other long term (current) drug therapy: Secondary | ICD-10-CM | POA: Diagnosis not present

## 2023-10-17 DIAGNOSIS — Z7901 Long term (current) use of anticoagulants: Secondary | ICD-10-CM | POA: Diagnosis not present

## 2023-10-17 DIAGNOSIS — I70235 Atherosclerosis of native arteries of right leg with ulceration of other part of foot: Secondary | ICD-10-CM

## 2023-10-17 MED ORDER — DOXYCYCLINE HYCLATE 100 MG PO CAPS: 100.0000 mg | ORAL_CAPSULE | Freq: Two times a day (BID) | ORAL | 0 refills | Status: DC

## 2023-10-17 MED ORDER — LIDOCAINE-EPINEPHRINE (PF) 2 %-1:200000 IJ SOLN
10.0000 mL | Freq: Once | INTRAMUSCULAR | Status: AC
  Administered 2023-10-17: 10 mL
  Filled 2023-10-17: qty 20

## 2023-10-17 NOTE — ED Triage Notes (Signed)
Pt here for abscess to R buttock x 3 days. No drainage noted, hard to touch.

## 2023-10-17 NOTE — ED Notes (Signed)
Wound dressing applied

## 2023-10-17 NOTE — Telephone Encounter (Signed)
Pt called me this morning to report since yesterday she has noticed a large boil on her bottom that is very painful that is the size of a tennis ball.  She mentioned using fatback to draw it out or asked should she go to ER--I suggested go to ER to have it looked and if it needed lancing they it needs to be done in a clean/sterile fashion since she is high risk for infections. She is going to go to ER.   She also needs f/u for her high cardiomems readings recently and med changes. Will keep check on her ER visit and if she is home before I am to leave work then will get out there, however she does have f/u in clinic tomor, so I will see her then as well.   Kerry Hough, EMT-Paramedic  [phone] 10/17/2023

## 2023-10-17 NOTE — ED Provider Notes (Signed)
[place] EMERGENCY DEPARTMENT AT [facility] Provider Note   CSN: [id number] Arrival date & time: 10/17/23  1108     History  Chief Complaint  Patient presents with   Abscess    [name] is a 62 y.o. female.  62 year old female with prior medical history as detailed below presents for evaluation.  Patient noted small abscess to the right inferior buttock.  First noticed 3 days ago.  Patient reports no drainage from area.  She denies fever.  She denies significant surrounding inflammation or pain.  The history is provided by the patient and medical records.       Home Medications Prior to Admission medications   Medication Sig Start Date End Date Taking? Authorizing Provider  ACCU-CHEK GUIDE test strip USE TO CHECK BLOOD SUGAR FOUR TIMES DAILY 02/25/20   [provider]  acetaminophen (TYLENOL) 500 MG tablet Take 1,000 mg by mouth every 6 (six) hours as needed for headache (pain).    [provider]  albuterol (VENTOLIN HFA) 108 (90 Base) MCG/ACT inhaler USE 2 PUFFS BY MOUTH EVERY FOUR HOURS, AS NEEDED, FOR COUGHING/WHEEZING 07/12/23   [name], MD  apixaban (ELIQUIS) 5 MG TABS tablet Take 1 tablet (5 mg total) by mouth 2 (two) times daily. 07/05/23   Clegg, [name] D, NP  budesonide-formoterol (SYMBICORT) 160-4.5 MCG/ACT inhaler Inhale 2 puffs into the lungs 2 (two) times daily. 07/12/23   [name], MD  buPROPion ER (WELLBUTRIN SR) 100 MG 12 hr tablet Take 1 tablet (100 mg total) by mouth daily. 07/05/23   Clegg, [name] D, NP  Cholecalciferol (VITAMIN D3 PO) Take 1 capsule by mouth every morning.    [provider]  colchicine 0.6 MG tablet Take 1.2 mg by mouth See admin instructions. Take 1.2 mg for flair up and an hour later take 0.6 mg if needed    [provider]  EASY COMFORT PEN NEEDLES 31G X 5 MM MISC USE FOUR TIMES A DAY FOR INSULIN ADMINISTRATION 08/30/22   Georgian Co M, PA-C  fluticasone (FLONASE) 50 MCG/ACT  nasal spray Place 1 spray into both nostrils as needed for allergies or rhinitis.    [provider]  insulin lispro (HUMALOG) 100 UNIT/ML KwikPen Inject 10 Units into the skin 3 (three) times daily with meals. 04/05/23   [name], MD  LANTUS SOLOSTAR 100 UNIT/ML Solostar Pen Inject 37 Units into the skin daily. 04/05/23   [name], MD  levothyroxine (SYNTHROID) 50 MCG tablet TAKE 1 TABLET (50 MCG TOTAL) BY MOUTH DAILY BEFORE BREAKFAST (AM) 05/02/23   [name], MD  losartan (COZAAR) 25 MG tablet Take 1 tablet (25 mg total) by mouth daily. 10/13/23 11/12/23  Arrien, York Ram, MD  metolazone (ZAROXOLYN) 2.5 MG tablet Take 1 tablet (2.5 mg total) by mouth as directed. Take once weekly as needed in case of weight gain 2 to 3 lbs in 24 hrs or 5 lbs in 7 days. 10/13/23 01/11/24  Arrien, York Ram, MD  metoprolol succinate (TOPROL-XL) 25 MG 24 hr tablet Take 1 tablet (25 mg total) by mouth daily. 09/28/23 10/28/23  Jerald Kief, MD  pantoprazole (PROTONIX) 40 MG tablet TAKE 1 TABLET (40 MG TOTAL) BY MOUTH DAILY(AM) 05/02/23   [name], MD  polyethylene glycol (MIRALAX / GLYCOLAX) 17 g packet Take 17 g by mouth daily as needed for moderate constipation. 08/05/23   Kathlen Mody, MD  rosuvastatin (CRESTOR) 40 MG tablet TAKE 1 TABLET (40 MG TOTAL) BY  MOUTH DAILY (BEDTIME) 08/31/23   Cannon Kettle, PA-C  spironolactone (ALDACTONE) 25 MG tablet Take 0.5 tablets (12.5 mg total) by mouth daily. 10/13/23 11/12/23  Arrien, York Ram, MD  torsemide (DEMADEX) 100 MG tablet Take 1 tablet (100 mg total) by mouth 2 (two) times daily. 10/03/23   Laurey Morale, MD  triamcinolone ointment (KENALOG) 0.1 % Apply topically 2 (two) times daily. 08/09/23   [name], MD  VITAMIN E PO Take 1 tablet by mouth daily.    [provider]  tiotropium (SPIRIVA HANDIHALER) 18 MCG inhalation capsule Place 1 capsule (18 mcg total) into inhaler and inhale every  morning. Patient not taking: Reported on 02/09/2021 12/09/19 02/09/21  [name], MD      Allergies    Gabapentin and Lyrica [pregabalin]    Review of Systems   Review of Systems  All other systems reviewed and are negative.   Physical Exam Updated Vital Signs BP (!) 148/66 (BP Location: Right Arm)   Pulse 73   Temp 98.1 F (36.7 C) (Oral)   Resp 18   Ht 5\' 2"  (1.575 m)   Wt 102.1 kg   LMP  (LMP Unknown)   SpO2 97%   BMI 41.15 kg/m  Physical Exam Vitals and nursing note reviewed.  Constitutional:      General: She is not in acute distress.    Appearance: Normal appearance. She is well-developed.  HENT:     Head: Normocephalic and atraumatic.  Eyes:     Conjunctiva/sclera: Conjunctivae normal.     Pupils: Pupils are equal, round, and reactive to light.  Cardiovascular:     Rate and Rhythm: Normal rate and regular rhythm.     Heart sounds: Normal heart sounds.  Pulmonary:     Effort: Pulmonary effort is normal. No respiratory distress.     Breath sounds: Normal breath sounds.  Abdominal:     General: There is no distension.     Palpations: Abdomen is soft.     Tenderness: There is no abdominal tenderness.  Musculoskeletal:        General: No deformity. Normal range of motion.     Cervical back: Normal range of motion and neck supple.  Skin:    General: Skin is warm and dry.     Comments: Small superficial fluctuant abscess noted to the right inferior buttock.  Abscess itself is approximately 1 x 2 cm.  Abscess has come to ahead but there is no drainage yet.  No significant surrounding erythema or signs of cellulitis.  Neurological:     General: No focal deficit present.     Mental Status: She is alert and oriented to person, place, and time.     ED Results / Procedures / Treatments   Labs (all labs ordered are listed, but only abnormal results are displayed) Labs Reviewed - No data to display  EKG None  Radiology No results  found.  Procedures .Incision and Drainage  Date/Time: 10/17/2023 4:28 PM  Performed by: Wynetta Fines, MD Authorized by: Wynetta Fines, MD   Consent:    Consent obtained:  Verbal   Consent given by:  Patient   Risks, benefits, and alternatives were discussed: yes     Risks discussed:  Bleeding, damage to other organs, incomplete drainage, infection and pain   Alternatives discussed:  No treatment Universal protocol:    Immediately prior to procedure, a time out was called: yes     Patient identity confirmed:  Verbally with patient Location:    Type:  Abscess   Location: Right inferior buttock. Pre-procedure details:    Skin preparation:  Povidone-iodine Anesthesia:    Anesthesia method:  Local infiltration   Local anesthetic:  Lidocaine 1% WITH epi Procedure type:    Complexity:  Simple Procedure details:    Ultrasound guidance: no     Needle aspiration: yes     Needle size:  18 G   Incision types:  Stab incision   Incision depth:  Dermal   Wound management:  Probed and deloculated   Drainage:  Purulent and bloody   Drainage amount:  Moderate   Wound treatment:  Wound left open and drain placed   Packing materials:  1/4 in iodoform gauze Post-procedure details:    Procedure completion:  Tolerated     Medications Ordered in ED Medications  lidocaine-EPINEPHrine (XYLOCAINE W/EPI) 2 %-1:200000 (PF) injection 10 mL (has no administration in time range)    ED Course/ Medical Decision Making/ A&P                                 Medical Decision Making Risk Prescription drug management.    Medical Screen Complete  This patient presented to the ED with complaint of abscess.  This complaint involves an extensive number of treatment options. The initial differential diagnosis includes, but is not limited to, skin abscess versus cellulitis  This presentation is: Acute, Self-Limited, Previously Undiagnosed, Uncertain Prognosis, and Complicated  Patient is  presenting for evaluation of simple abscess on right inferior buttock.  Patient without evidence of systemic illness.  Abscess opened with I&D without difficulty.  Patient tolerated well.  Patient is comfortable after intervention.  She understands need for close outpatient follow-up.  Strict return precautions given understood.  Additional history obtained:   External records from outside sources obtained and reviewed including prior ED visits and prior Inpatient records.    Problem List / ED Course:  Right buttock abscess   Reevaluation:  After the interventions noted above, I reevaluated the patient and found that they have: improved  Disposition:  After consideration of the diagnostic results and the patients response to treatment, I feel that the patent would benefit from close outpatient follow-up.          Final Clinical Impression(s) / ED Diagnoses Final diagnoses:  Abscess    Rx / DC Orders ED Discharge Orders          Ordered    doxycycline (VIBRAMYCIN) 100 MG capsule  2 times daily        10/17/23 1627              Wynetta Fines, MD 10/17/23 1630

## 2023-10-17 NOTE — Discharge Instructions (Addendum)
Return for any problem.  ?

## 2023-10-18 ENCOUNTER — Other Ambulatory Visit (HOSPITAL_COMMUNITY): Payer: Self-pay

## 2023-10-18 ENCOUNTER — Ambulatory Visit (HOSPITAL_COMMUNITY)
Admission: RE | Admit: 2023-10-18 | Discharge: 2023-10-18 | Disposition: A | Discharge status: Home / Self Care | Payer: 59 | Source: Ambulatory Visit | Attending: Cardiology | Admitting: Cardiology

## 2023-10-18 ENCOUNTER — Encounter (HOSPITAL_COMMUNITY): Payer: Self-pay

## 2023-10-18 ENCOUNTER — Other Ambulatory Visit (HOSPITAL_COMMUNITY): Payer: Self-pay | Admitting: Cardiology

## 2023-10-18 VITALS — BP 142/68 | HR 68 | Wt 228.8 lb

## 2023-10-18 DIAGNOSIS — N183 Chronic kidney disease, stage 3 unspecified: Secondary | ICD-10-CM | POA: Diagnosis not present

## 2023-10-18 DIAGNOSIS — Z794 Long term (current) use of insulin: Secondary | ICD-10-CM | POA: Insufficient documentation

## 2023-10-18 DIAGNOSIS — F17210 Nicotine dependence, cigarettes, uncomplicated: Secondary | ICD-10-CM | POA: Insufficient documentation

## 2023-10-18 DIAGNOSIS — I480 Paroxysmal atrial fibrillation: Secondary | ICD-10-CM | POA: Insufficient documentation

## 2023-10-18 DIAGNOSIS — Z79899 Other long term (current) drug therapy: Secondary | ICD-10-CM | POA: Insufficient documentation

## 2023-10-18 DIAGNOSIS — J449 Chronic obstructive pulmonary disease, unspecified: Secondary | ICD-10-CM | POA: Diagnosis not present

## 2023-10-18 DIAGNOSIS — Z7901 Long term (current) use of anticoagulants: Secondary | ICD-10-CM | POA: Insufficient documentation

## 2023-10-18 DIAGNOSIS — I5022 Chronic systolic (congestive) heart failure: Secondary | ICD-10-CM | POA: Diagnosis not present

## 2023-10-18 DIAGNOSIS — K7460 Unspecified cirrhosis of liver: Secondary | ICD-10-CM | POA: Insufficient documentation

## 2023-10-18 DIAGNOSIS — L97519 Non-pressure chronic ulcer of other part of right foot with unspecified severity: Secondary | ICD-10-CM | POA: Insufficient documentation

## 2023-10-18 DIAGNOSIS — I5023 Acute on chronic systolic (congestive) heart failure: Secondary | ICD-10-CM | POA: Diagnosis not present

## 2023-10-18 DIAGNOSIS — L97529 Non-pressure chronic ulcer of other part of left foot with unspecified severity: Secondary | ICD-10-CM | POA: Insufficient documentation

## 2023-10-18 DIAGNOSIS — E11621 Type 2 diabetes mellitus with foot ulcer: Secondary | ICD-10-CM | POA: Insufficient documentation

## 2023-10-18 DIAGNOSIS — Z8249 Family history of ischemic heart disease and other diseases of the circulatory system: Secondary | ICD-10-CM | POA: Insufficient documentation

## 2023-10-18 DIAGNOSIS — E1122 Type 2 diabetes mellitus with diabetic chronic kidney disease: Secondary | ICD-10-CM | POA: Insufficient documentation

## 2023-10-18 DIAGNOSIS — G4733 Obstructive sleep apnea (adult) (pediatric): Secondary | ICD-10-CM | POA: Diagnosis not present

## 2023-10-18 DIAGNOSIS — I130 Hypertensive heart and chronic kidney disease with heart failure and stage 1 through stage 4 chronic kidney disease, or unspecified chronic kidney disease: Secondary | ICD-10-CM | POA: Insufficient documentation

## 2023-10-18 DIAGNOSIS — E785 Hyperlipidemia, unspecified: Secondary | ICD-10-CM | POA: Diagnosis not present

## 2023-10-18 DIAGNOSIS — I2510 Atherosclerotic heart disease of native coronary artery without angina pectoris: Secondary | ICD-10-CM | POA: Diagnosis not present

## 2023-10-18 DIAGNOSIS — E1151 Type 2 diabetes mellitus with diabetic peripheral angiopathy without gangrene: Secondary | ICD-10-CM | POA: Insufficient documentation

## 2023-10-18 LAB — BASIC METABOLIC PANEL
Anion gap: 11 (ref 5–15)
BUN: 50 mg/dL — ABNORMAL HIGH (ref 6–20)
CO2: 27 mmol/L (ref 22–32)
Calcium: 8.8 mg/dL — ABNORMAL LOW (ref 8.9–10.3)
Chloride: 99 mmol/L (ref 98–111)
Creatinine, Ser: 1.51 mg/dL — ABNORMAL HIGH (ref 0.44–1.00)
GFR, Estimated: 39 mL/min — ABNORMAL LOW (ref >60)
Glucose, Bld: 288 mg/dL — ABNORMAL HIGH (ref 70–99)
Potassium: 4.4 mmol/L (ref 3.5–5.1)
Sodium: 137 mmol/L (ref 135–145)

## 2023-10-18 LAB — BRAIN NATRIURETIC PEPTIDE: B Natriuretic Peptide: 1076.2 pg/mL — ABNORMAL HIGH (ref 0.0–100.0)

## 2023-10-18 NOTE — Progress Notes (Signed)
Patient ID: [name], female   DOB: Sep 18, 1961, 62 y.o.   MRN: [id number]    Advanced Heart Failure Clinic Note   PCP: [facility] PV: Dr. Allyson Sabal HF Cardiology: Dr. Shirlee Latch  [name] is a 62 y.o. with history of PAD, carotid stenosis s/p left CEA, relatively mild CAD, chronic systolic CHF, paroxysmal atrial fibrillation and prior substance abuse presents for CHF clinic followup.  She was admitted in 1/17 with acute hypoxemic respiratory failure in the setting of atrial fibrillation/RVR and volume overload.  She was initially intubated.  She converted back to NSR with amiodarone gtt.  She was treated with IV Lasix, steroids, bronchodilators.  She developed AKI and losartan was stopped.    She was admitted in 3/17 with symptomatic bradycardia.  She was supposed to stop diltiazem after this admission but continued to take it.  She presented later in 3/17, again with symptomatic bradycardia (junctional bradycardia), hypotension, and AKI.  Diltiazem and bisoprolol were stopped.  HR recovered and she has had no problems since.  ACEI was stopped with AKI.   In 5/18, she had peripheral angiography with atherectomy of right SFA.  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 7/18 ABIs showed 0.65 on right, 0.31 on left.  In 8/20, she had peripheral angiography showing totally occluded left SFA.  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 Arbie Cookey.  ABIs in 2/21 were 0.44 on right, 0.99 on left.   Echo in 1/21 showed EF 50%, mild LVH, normal RV, PASP 60 mmHg.   Echo 03/08/21 EF 55-60% with basal to mid inferolateral hypokinesis.   Admitted 6/22 for mechanical fall, found to have AKI on CKD. She was given IV lasix and her losartan was held.  Underwent PV angiogram with Dr. Allyson Sabal 7/22. She had drug-coated balloon angioplasty of the right SFA after being admitted for critical ischemia of the right foot.  Repeat peripheral  arterial dopplers in 8/22 still showed severe right SFA disease.   Admitted 8/22 for bradycardia and AKI, bisoprolol subsequently stopped; amiodarone was continued.   Echo 4/24 EF 50-55%, RV normal.  Admitted 9/24 with UTI/sepsis. Required ICU care, BiPap and abx. Underwent RLE angiogram with femoropopliteal angioplasty and stenting.  She was admitted in 11/24 with atrial fibrillation/RVR.  Echo in 11/24 showed EF 45-50%, normal RV, mild MR, anterolateral akinesis. She was cardioverted.  She was taken off losartan and spironolactone. Prior to admission, she was on torsemide 100 mg bid and metolazone 2.5 once a week.  She was sent home only on torsemide 100 mg daily and no metolazone.   She was seen last month in clinic for post hospital f/u. Not using Cardiomems regularly. Weight was up 23 lbs since previous appointment. She reported worsening swelling since discharge from hospital on significantly lower diuretic regimen than she went to the hospital with. +3 pillow orthopnea.  Short of breath with transfers, dressing, and bathing. Denied CP.  She was prescribed Furoscix 80 mg [place] daily x 3 days. After completing Furoscix, she was instructed to restart torsemide at a higher dose at 100 mg bid + restart metolazone 2.5 mg once weekly on Wednesdays + KCl 40 mg daily.   Since then, she got readmitted again for CHF 11/26-11/29. She was diuresed w/ IV Lasix. She was transitioned back to torsemide 100 mg bid but instructed to take metolazone only PRN instead of once weekly. Of note, she was  discharged home on 11/29 and her CardioMEMs reading next day on 11/30 showed elevated diastolic PA pressure of 31 mmHg (goal 20), suggesting she was not fully diuresed at d/c.   Earlier this wk, on 12/2, she was contacted given elevated CardioMEMs reading, suggesting continued volume overload. Reading was elevated at 37 mmHg. She was instructed to use 80 mg of Furoscix and to return back today for f/u.   She is here w/  paramedicine [name]). Remains volume overloaded w/ significant LEE. Has improved compliance w/ CardioMEMs and has submitting readings daily for the last 4 days. Diastolic PA pressure remains well above goal. Reading today was 36 mmHg (Goal 20 mmHg). She reports full med compliance. She currently has Furoscix applied. She reports decent UOP but says that her UOP is more robust w/ torsemide. BP mildly elevated 142/68. She admits to high fluid intake. She has been trying to limit sodium intake.     ECG: not performed   Labs (5/19): K 5, creatinine 1.75 Labs (2/21): K 4.1, creatinine 1.58 Labs (4/22): K 3.7, creatinine 1.55 Labs (5/22): K 3.8, creatinine 1.54 Labs (8/22): K 4.0, creatinine 2.07 Labs (11/22): TSH normal, LDL 59 Labs (12/22): K 3.6, creatinine 1.28, BNP 256, AST/ALT normal Labs (3/23): K 4.3, creatinine 1.82 => 1.7, hgb 11 Labs (5/22) K 3.6, creatinine 2.2, TG 405  Labs (6/22) K 3.6, creatinine 1.34  Labs (8/23): K 4.7, creatinine 2.11, LDL 93, TGs 315 Labs (10/23): K 3.4, creatinine 1.35 Labs (1/24): K 4.0, creatinine 3.58 Labs (9/24): K 3.8, creatinine 1.44 Labs (11/24): K 4, creatinine 1.59 Labs (11/24): 4.2, creatinine 1.28   PMH: 1. Carotid stenosis: Known occluded right carotid.  Left CEA in 4/16.  - Carotid dopplers (7/21): CTO RICA, mild disease LICA.  - Carotid dopplers (7/22): CTO RICA, 1-39% LICA.  - Carotid dopplers (3/23): Totally occluded RICA, 1-39% LICA.  2. CAD: LHC in 12/15 with 80% stenosis in small OM1, nonobstructive disease in other territories.  3. Chronic systolic CHF: Nonischemic cardiomyopathy (?due to ETOH or prior drug abuse).  She has a Cardiomems.  - Echo (1/17) with EF 45%, mild LV hypertrophy, moderate diastolic dysfunction, inferolateral severe hypokinesis, mildly decreased RV systolic function.  - Echo (1/18): EF 40-45%, mild LVH, normal RV size and systolic function.  - Echo (1/21): EF 50%, mild LVH, normal RV, PASP 60 mmHg.  - Echo  (4/22): EF 55-60%, basal-mid inferolateral hypokinesis with grade II diastolic dysfunction, RV normal, PASP 42 - Echo (4/24): EF 50-55%.  - Echo (11/24): EF 45-50%, normal RV, mild MR, anterolateral akinesis. 4. Atrial fibrillation: Paroxysmal.   - DCCV 11/24 5. Type II diabetes 6. CKD: Stage III. 7. COPD: Smokes 1/4-1/2 ppd.  8. 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  No longer drinks.  9. Hypothyroidism 10. PAD: Atherectomy SFA in 2014 (Dr Allyson Sabal).  Peripheral arterial dopplers (2/17) with focal 75-99% proximal right SFA stenosis, occluded mid-distal right SFA, chronic occlusion of all runoff arteries on right. - In 5/18, she had peripheral angiography with atherectomy of right SFA.   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  - 7/18 ABIs showed 0.65 on right, 0.31 on left.   - Peripheral angiography 8/20: Totally occluded left SFA.  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 Arbie Cookey.   - ABIs (2/21): 0.44 R, 0.99 L.  - Balloon angioplasty right SFA 7/22.  - ABIs (8/22): severe right SFA disease.  -  peripheral arterial dopplers (2/23): 50-74% right SFA and right popliteal. Stable.  - s/p RLE angiogram with femoropopliteal angioplasty and stenting 9/24 - ABIs (11/24): 0.6 on right, 1.03 on left.  11. Anemia 12. Prior cocaine abuse.  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  14. OSA: Has not been able to tolerate CPAP.  15. Diabetic neuropathy.   SH: Lives with sister.  Prior cocaine abuse.  Prior ETOH abuse.  Quit smoking in 1/17, restarted, and quit again in 4/19, restarted again.  FH: CAD  Review of systems complete and found to be negative unless listed in HPI.    Current Outpatient Medications  Medication Sig Dispense Refill   ACCU-CHEK GUIDE test strip USE TO CHECK BLOOD SUGAR FOUR TIMES DAILY     acetaminophen (TYLENOL) 500 MG tablet Take 1,000 mg by mouth every 6 (six)  hours as needed for headache (pain).     albuterol (VENTOLIN HFA) 108 (90 Base) MCG/ACT inhaler USE 2 PUFFS BY MOUTH EVERY FOUR HOURS, AS NEEDED, FOR COUGHING/WHEEZING 8.5 g 0   apixaban (ELIQUIS) 5 MG TABS tablet Take 1 tablet (5 mg total) by mouth 2 (two) times daily. 60 tablet 6   budesonide-formoterol (SYMBICORT) 160-4.5 MCG/ACT inhaler Inhale 2 puffs into the lungs 2 (two) times daily. 1 each 2   buPROPion ER (WELLBUTRIN SR) 100 MG 12 hr tablet Take 1 tablet (100 mg total) by mouth daily. 30 tablet 6   Cholecalciferol (VITAMIN D3 PO) Take 1 capsule by mouth every morning.     colchicine 0.6 MG tablet Take 1.2 mg by mouth See admin instructions. Take 1.2 mg for flair up and an hour later take 0.6 mg if needed     doxycycline (VIBRAMYCIN) 100 MG capsule Take 1 capsule (100 mg total) by mouth 2 (two) times daily. 20 capsule 0   EASY COMFORT PEN NEEDLES 31G X 5 MM MISC USE FOUR TIMES A DAY FOR INSULIN ADMINISTRATION 100 each 1   fluticasone (FLONASE) 50 MCG/ACT nasal spray Place 1 spray into both nostrils as needed for allergies or rhinitis.     Furosemide (FUROSCIX [place]) Inject into the skin as directed. As needed     insulin lispro (HUMALOG) 100 UNIT/ML KwikPen Inject 10 Units into the skin 3 (three) times daily with meals. 15 mL 0   LANTUS SOLOSTAR 100 UNIT/ML Solostar Pen Inject 37 Units into the skin daily. 15 mL 0   levothyroxine (SYNTHROID) 50 MCG tablet TAKE 1 TABLET (50 MCG TOTAL) BY MOUTH DAILY BEFORE BREAKFAST (AM) 30 tablet 0   losartan (COZAAR) 25 MG tablet Take 1 tablet (25 mg total) by mouth daily. 30 tablet 0   metolazone (ZAROXOLYN) 2.5 MG tablet Take 1 tablet (2.5 mg total) by mouth as directed. Take once weekly as needed in case of weight gain 2 to 3 lbs in 24 hrs or 5 lbs in 7 days.     metoprolol succinate (TOPROL-XL) 25 MG 24 hr tablet Take 1 tablet (25 mg total) by mouth daily. 30 tablet 0   pantoprazole (PROTONIX) 40 MG tablet TAKE 1 TABLET (40 MG TOTAL) BY MOUTH DAILY(AM) 30  tablet 0   polyethylene glycol (MIRALAX / GLYCOLAX) 17 g packet Take 17 g by mouth daily as needed for moderate constipation. 14 each 0   rosuvastatin (CRESTOR) 40 MG tablet TAKE 1 TABLET (40 MG TOTAL) BY MOUTH DAILY (BEDTIME) 90 tablet 0   spironolactone (ALDACTONE) 25 MG tablet Take 0.5 tablets (12.5 mg total) by  mouth daily. 15 tablet 0   torsemide (DEMADEX) 100 MG tablet Take 1 tablet (100 mg total) by mouth 2 (two) times daily. 180 tablet 3   triamcinolone ointment (KENALOG) 0.1 % Apply topically 2 (two) times daily. 454 g 0   VITAMIN E PO Take 1 tablet by mouth daily.     No current facility-administered medications for this encounter.   BP (!) 142/68   Pulse 68   Wt 103.8 kg (228 lb 12.8 oz)   LMP  (LMP Unknown)   SpO2 97%   BMI 41.85 kg/m   Wt Readings from Last 3 Encounters:  10/18/23 103.8 kg (228 lb 12.8 oz)  10/17/23 102.1 kg (225 lb)  10/13/23 101.7 kg (224 lb 4.8 oz)   PHYSICAL EXAM: General:  chronically ill appearing. No respiratory difficulty HEENT: normal Neck: supple. JVD 12 cm. Carotids 2+ bilat; no bruits. No lymphadenopathy or thyromegaly appreciated. Cor: PMI nondisplaced. Regular rate & rhythm. No rubs, gallops or murmurs. Lungs: decreased BS at the bases, expiratory wheezing b/l upper lobes  Abdomen: soft, nontender, obese/distended. No hepatosplenomegaly. No bruits or masses. Good bowel sounds. Extremities: no cyanosis, clubbing, rash, 2+ tense b/l LE edema up to knees Neuro: alert & oriented x 3, cranial nerves grossly intact. moves all 4 extremities w/o difficulty. Affect pleasant.   Assessment/Plan: 1. Acute on Chronic HF with mid range EF: LHC in 12/15 showing only 80% stenosis in small OM1. EF as low as 40-45% in the past. Echo 4/22 showed EF 55-60% with grade 2 diastolic dysfunction.  Echo (4/24) EF 50-55%.  Echo in 11/24 with EF 45-50%, normal RV, mild MR, anterolateral akinesis.  NYHA class III (chronic). Volume overloaded on exam w/ significant  peripheral LEE and by CardioMEMs assessment. Diastolic PA pressure today was elevated at 36 mmHg (goal is 20). She reports UOP  is not as robust w/ Furoscix as torsemide.  - switch back to torsemide 100 mg bid - take metolazone 2.5 mg daily x 2 days w/ extra 40 mEq of KCl (will do this Thursday and Friday). Starting Monday, will take metolazone once weekly  - encouraged to continue submitting daily CardioMEMs readings to assess trends to better guide diuretic dosing  - continue Losartan 25 mg daily  - increase spironolactone to 25 mg daily  - She is off Jardiance due to severe UTIs.  - Continue Toprol XL 25 mg daily.  - BMP/BNP today and again in 7 days  2. Atrial fibrillation: Paroxysmal. RRR on exam  - Continue Eliquis 5 mg bid. Denies abnormal bleeding  - I would like her to eventually be seen by EP for AF ablation, but need to control her volume first.  3. PAD: Claudication bilaterally.  Not candidate for cilostazol with CHF.  She has had a left fem-pop bypass in 8/20.  ABIs in 2/21 were very abnormal on right. She underwent PV angiogram with balloon angioplasty of the right SFA 7/22.  8/22 dopplers still showed severe right SFA disease. Dopplers 2/23 showed patent right SFA.  Peripheral arterial dopplers in 2/23 showed 50-74% stenosis right SFA and popliteal.  S/p RLE femoropopliteal angioplasty and stenting in 9/24. She still has bilateral foot ulcers.  - [facility] - Continue rosuvastatin.  - She is on apixaban so no ASA.  4. COPD:  Has cut back from 1/2 ppd to 3-4 cigs/day. Continue bupropion.  - Discussed smoking cessation.    5. H/o cirrhosis: Likely from ETOH, she has quit.  6.  OSA: Not able to tolerate CPAP.   7. CKD stage III:  Baseline creatinine around 1.5 - BMET today and repeat in 7 days 8. HTN: mildly elevated in setting of volume overload. - GDMT per above>>increasing spiro to 25 mg today  9.Type 2 diabetes: Per PCP.  She is on insulin. 10. CAD: LHC in  12/15 with 80% stenosis in small OM1, nonobstructive disease in other territories. No chest pain. - Continue rosuvastatin.  - Not on ASA with need for Eliquis. 11. Carotid stenosis: Stable 4/24 dopplers. 12. Hyperlipidemia: She is on Crestor and Vascepa.   F/u again in 7-10 days to reassess volume status.  Robbie Lis, PA-C  10/18/2023   Robbie Lis, PA-C 10/18/2023

## 2023-10-18 NOTE — Patient Instructions (Addendum)
Labs done today. We will contact you only if your labs are abnormal.  RESTART Torsemide 100mg  (5 tablets) by mouth 2 times daily.  TAKE 2.5MG  (1 TABLET) BY MOUTH TODAY ALONG WITH AN EXTRA FOR 2 DAYS   THEN RESTART 1 TABLET OF METLAZONE EVERY MONDAY.   PLEASE DO YOUR CARDIOMEMS DAILY.  No other medication changes were made. Please continue all current medications as prescribed.  Your physician recommends that you schedule a follow-up appointment in: 7-10 days  If you have any questions or concerns before your next appointment please send [place] a message through [place] or call our office at [phone].    TO LEAVE A MESSAGE FOR THE NURSE SELECT OPTION 2, PLEASE LEAVE A MESSAGE INCLUDING: YOUR NAME DATE OF BIRTH CALL BACK NUMBER REASON FOR CALL**this is important as we prioritize the call backs  YOU WILL RECEIVE A CALL BACK THE SAME DAY AS LONG AS YOU CALL BEFORE 4:00 PM   Do the following things EVERYDAY: Weigh yourself in the morning before breakfast. Write it down and keep it in a log. Take your medicines as prescribed Eat low salt foods--Limit salt (sodium) to 2000 mg per day.  Stay as active as you can everyday Limit all fluids for the day to less than 2 liters   At the [facility], you and your health needs are our priority. As part of our continuing mission to provide you with exceptional heart care, we have created designated Provider Care Teams. These Care Teams include your primary Cardiologist (physician) and Advanced Practice Providers (APPs- Physician Assistants and Nurse Practitioners) who all work together to provide you with the care you need, when you need it.   You may see any of the following providers on your designated Care Team at your next follow up: Dr Arvilla Meres Dr Marca Ancona Dr. Marcos Eke, NP Robbie Lis, [place] [facility] [employer] [facility] [place], [place] Brynda Peon, NP Karle Plumber, PharmD   Please be  sure to bring in all your medications bottles to every appointment.    Thank you for choosing [facility]

## 2023-10-18 NOTE — Progress Notes (Addendum)
Came out for med rec.   Doxy was delivered so that was added to pill box so she started that today.   Added metolazone 2.5mg  for thurs and fri and starting mon for weekly dosing with of K.    Increased spiro 25mg   Added losartan to pill box.   Refills needed-- Pantoprazole-missing in sun-wed Metoprolol needs new rx sent in-toc no refills  Spiro-needs in sat-wed dose Metolazone- Wellbutrin-  Asked pharm to fill what he has on file already.   Kerry Hough, EMT-Paramedic  [phone] 10/18/2023

## 2023-10-18 NOTE — Progress Notes (Signed)
Paramedicine Encounter   Patient ID: [name] , female,   DOB: 04-Sep-1961,61 y.o.,  MRN: [id number]   Met patient in clinic today with provider.  Weight @ clinic-228 B/P-142/68 P-68 [id number]  She was d/c over the wknd. She was told to hold her potassium and metolazone PRN.  [name] called pt Monday to place furoscix for 2 days and send in readings. She did place this one about an hour ago and used one yesterday--didn't use on Monday due to her sister not being home and helping with the admin of it.   I was off Monday and then yesterday she went to ER so I wasn't able to see her to f/u on any changes.  She did use cardiomems this morning--36%  Home weight-222  Med changes- 2.5mg  metolazone thur and fri  of K with metolazone   I will go out this evening to fix pill box.  Restart on Monday 2.5mg  metolazone weekly    Labs today and again in 7-10 days   She goes to wound center tomor to get her legs wrapped.   Kerry Hough, EMT-Paramedic [phone] 10/18/2023

## 2023-10-19 ENCOUNTER — Other Ambulatory Visit (HOSPITAL_COMMUNITY): Payer: Self-pay

## 2023-10-19 ENCOUNTER — Encounter (HOSPITAL_BASED_OUTPATIENT_CLINIC_OR_DEPARTMENT_OTHER): Payer: 59 | Attending: Internal Medicine | Admitting: Internal Medicine

## 2023-10-19 DIAGNOSIS — I5033 Acute on chronic diastolic (congestive) heart failure: Secondary | ICD-10-CM | POA: Diagnosis not present

## 2023-10-19 DIAGNOSIS — J449 Chronic obstructive pulmonary disease, unspecified: Secondary | ICD-10-CM | POA: Diagnosis not present

## 2023-10-19 DIAGNOSIS — G4730 Sleep apnea, unspecified: Secondary | ICD-10-CM | POA: Insufficient documentation

## 2023-10-19 DIAGNOSIS — E1140 Type 2 diabetes mellitus with diabetic neuropathy, unspecified: Secondary | ICD-10-CM | POA: Diagnosis not present

## 2023-10-19 DIAGNOSIS — E1122 Type 2 diabetes mellitus with diabetic chronic kidney disease: Secondary | ICD-10-CM | POA: Insufficient documentation

## 2023-10-19 DIAGNOSIS — F17210 Nicotine dependence, cigarettes, uncomplicated: Secondary | ICD-10-CM | POA: Diagnosis not present

## 2023-10-19 DIAGNOSIS — L97812 Non-pressure chronic ulcer of other part of right lower leg with fat layer exposed: Secondary | ICD-10-CM | POA: Diagnosis not present

## 2023-10-19 DIAGNOSIS — L89322 Pressure ulcer of left buttock, stage 2: Secondary | ICD-10-CM | POA: Diagnosis not present

## 2023-10-19 DIAGNOSIS — E1151 Type 2 diabetes mellitus with diabetic peripheral angiopathy without gangrene: Secondary | ICD-10-CM | POA: Diagnosis not present

## 2023-10-19 DIAGNOSIS — I130 Hypertensive heart and chronic kidney disease with heart failure and stage 1 through stage 4 chronic kidney disease, or unspecified chronic kidney disease: Secondary | ICD-10-CM | POA: Insufficient documentation

## 2023-10-19 DIAGNOSIS — I2510 Atherosclerotic heart disease of native coronary artery without angina pectoris: Secondary | ICD-10-CM | POA: Diagnosis not present

## 2023-10-19 DIAGNOSIS — L97512 Non-pressure chronic ulcer of other part of right foot with fat layer exposed: Secondary | ICD-10-CM | POA: Insufficient documentation

## 2023-10-19 DIAGNOSIS — L97522 Non-pressure chronic ulcer of other part of left foot with fat layer exposed: Secondary | ICD-10-CM | POA: Diagnosis not present

## 2023-10-19 DIAGNOSIS — E11621 Type 2 diabetes mellitus with foot ulcer: Secondary | ICD-10-CM | POA: Insufficient documentation

## 2023-10-19 DIAGNOSIS — L97828 Non-pressure chronic ulcer of other part of left lower leg with other specified severity: Secondary | ICD-10-CM | POA: Diagnosis not present

## 2023-10-19 DIAGNOSIS — N1832 Chronic kidney disease, stage 3b: Secondary | ICD-10-CM | POA: Diagnosis not present

## 2023-10-19 DIAGNOSIS — I872 Venous insufficiency (chronic) (peripheral): Secondary | ICD-10-CM | POA: Insufficient documentation

## 2023-10-19 MED ORDER — METOPROLOL SUCCINATE ER 25 MG PO TB24: 25.0000 mg | ORAL_TABLET | Freq: Every day | ORAL | 3 refills | Status: DC

## 2023-10-19 MED ORDER — SPIRONOLACTONE 25 MG PO TABS: 25.0000 mg | ORAL_TABLET | Freq: Every day | ORAL | 3 refills | Status: DC

## 2023-10-19 MED ORDER — METOLAZONE 2.5 MG PO TABS: 2.5000 mg | ORAL_TABLET | ORAL | 3 refills | Status: DC

## 2023-10-19 NOTE — Progress Notes (Signed)
I was due to come today for med rec however pt has been at doc for a while and wont be back soon, the pharm delivered her meds and they were left by the door.  I placed a sticky note on bag writing out specifically where the pantoprazole and spiro needed to go. I spoke with her sister on the phone and she is going to ensure it gets placed where needed.   Kerry Hough, EMT-Paramedic  [phone] 10/19/2023

## 2023-10-20 ENCOUNTER — Telehealth: Payer: Self-pay | Admitting: Family Medicine

## 2023-10-20 NOTE — Progress Notes (Signed)
[name], [name] ([id number]) [id number] Page 1 of 23 Visit Report for 10/19/2023 Arrival Information Details Patient Name: Date of Service: [name] 10/19/2023 2:30 PM Medical Record Number: [id number] Patient Account Number: [id number] Date of Birth/Sex: Treating RN: [birth year] (62 y.o. F) Primary Care Ricco Dershem: [name] Other Clinician: Referring Alanii Ramer: Treating Marcelline Temkin/Extender: Leotis Pain in Treatment: 26 Visit Information History Since Last Visit Added or deleted any medications: Yes Patient Arrived: Wheel Chair Any new allergies or adverse reactions: No Arrival Time: 15:15 Had a fall or experienced change in No Accompanied By: self activities of daily living that may affect Transfer Assistance: None risk of falls: Patient Identification Verified: Yes Signs or symptoms of abuse/neglect since No Secondary Verification Process Completed: Yes last visito Patient Requires Transmission-Based Precautions: No Hospitalized since last visit: No Patient Has Alerts: Yes Implantable device outside of the clinic No Patient Alerts: ABI R 0.57 (01/11/23) excluding ABI L 1.04 (01/11/23) cellular tissue based products placed in the center since last visit: Has Dressing in Place as Prescribed: Yes Has Footwear/Offloading in Place as Yes Prescribed: Left: Surgical Shoe with Pressure Relief Insole Right: Surgical Shoe with Pressure Relief Insole Pain Present Now: Yes Electronic Signature(s) Signed: 10/19/2023 4:48:36 PM By: Zenaida Deed RN, BSN Entered By: Zenaida Deed on 10/19/2023 15:19:09 -------------------------------------------------------------------------------- Clinic Level of Care Assessment Details Patient Name: Date of Service: Karla Bigness, [name] 10/19/2023 2:30 PM Medical Record Number: [id number] Patient Account Number: [id number] Date of Birth/Sex: Treating RN: Dec 17, 1960 (61 y.o. [name] Primary Care Yarnell Kozloski: [name] Other Clinician: Referring Muhamed Luecke: Treating Dakhari Zuver/Extender: Leotis Pain in Treatment: 26 Clinic Level of Care Assessment Items TOOL 4 Quantity Score X- 1 0 Use when only an EandM is performed on FOLLOW-UP visit ASSESSMENTS - Nursing Assessment / Reassessment X- 1 10 Reassessment of Co-morbidities (includes updates in patient status) X- 1 5 Reassessment of Adherence to Treatment Plan ASSESSMENTS - Wound and Skin A ssessment / Reassessment []  - 0 Simple Wound Assessment / Reassessment - one wound X- 7 5 Complex Wound Assessment / Reassessment - multiple wounds []  - 0 Dermatologic / Skin Assessment (not related to wound area) BRITTLYN, ROMANI ([id number]) [phone].pdf Page 2 of 23 ASSESSMENTS - Focused Assessment []  - 0 Circumferential Edema Measurements - multi extremities []  - 0 Nutritional Assessment / Counseling / Intervention []  - 0 Lower Extremity Assessment (monofilament, tuning fork, pulses) []  - 0 Peripheral Arterial Disease Assessment (using hand held doppler) ASSESSMENTS - Ostomy and/or Continence Assessment and Care []  - 0 Incontinence Assessment and Management []  - 0 Ostomy Care Assessment and Management (repouching, etc.) PROCESS - Coordination of Care []  - 0 Simple Patient / Family Education for ongoing care X- 1 20 Complex (extensive) Patient / Family Education for ongoing care X- 1 10 Staff obtains Chiropractor, Records, T Results / Process Orders est X- 1 10 Staff telephones HHA, Nursing Homes / Clarify orders / etc []  - 0 Routine Transfer to another Facility (non-emergent condition) []  - 0 Routine Hospital Admission (non-emergent condition) []  - 0 New Admissions / Manufacturing engineer / Ordering NPWT Apligraf, etc. , []  - 0 Emergency Hospital Admission (emergent condition) []  - 0 Simple Discharge Coordination X- 1 15 Complex (extensive)  Discharge Coordination PROCESS - Special Needs []  - 0 Pediatric / Minor Patient Management []  - 0 Isolation Patient Management []  - 0 Hearing / Language / Visual special needs []  - 0 Assessment of Community assistance (transportation, D/C planning,  etc.) []  - 0 Additional assistance / Altered mentation []  - 0 Support Surface(s) Assessment (bed, cushion, seat, etc.) INTERVENTIONS - Wound Cleansing / Measurement []  - 0 Simple Wound Cleansing - one wound X- 7 5 Complex Wound Cleansing - multiple wounds X- 1 5 Wound Imaging (photographs - any number of wounds) []  - 0 Wound Tracing (instead of photographs) []  - 0 Simple Wound Measurement - one wound X- 7 5 Complex Wound Measurement - multiple wounds INTERVENTIONS - Wound Dressings []  - 0 Small Wound Dressing one or multiple wounds []  - 0 Medium Wound Dressing one or multiple wounds X- 7 20 Large Wound Dressing one or multiple wounds []  - 0 Application of Medications - topical []  - 0 Application of Medications - injection INTERVENTIONS - Miscellaneous []  - 0 External ear exam []  - 0 Specimen Collection (cultures, biopsies, blood, body fluids, etc.) []  - 0 Specimen(s) / Culture(s) sent or taken to Lab for analysis []  - 0 Patient Transfer (multiple staff / [employer] / Similar devices) [name] ([id number]) [id number] Page 3 of 23 []  - 0 Simple Staple / Suture removal (25 or less) []  - 0 Complex Staple / Suture removal (26 or more) []  - 0 Hypo / Hyperglycemic Management (close monitor of Blood Glucose) []  - 0 Ankle / Brachial Index (ABI) - do not check if billed separately X- 1 5 Vital Signs Has the patient been seen at the hospital within the last three years: Yes Total Score: 325 Level Of Care: New/Established - Level 5 Electronic Signature(s) Signed: 10/19/2023 5:01:11 PM By: Karie Schwalbe RN Entered By: Karie Schwalbe on 10/19/2023  16:16:26 -------------------------------------------------------------------------------- Encounter Discharge Information Details Patient Name: Date of Service: Karla Bigness, [name] 10/19/2023 2:30 PM Medical Record Number: [id number] Patient Account Number: [id number] Date of Birth/Sex: Treating RN: 1961/07/13 (61 y.o. [name] Primary Care Marci Polito: [name] Other Clinician: Referring Shantil Vallejo: Treating Daliana Leverett/Extender: Leotis Pain in Treatment: 26 Encounter Discharge Information Items Discharge Condition: Stable Ambulatory Status: Wheelchair Discharge Destination: Home Transportation: Private Auto Accompanied By: self Schedule Follow-up Appointment: Yes Clinical Summary of Care: Patient Declined Electronic Signature(s) Signed: 10/19/2023 5:01:11 PM By: Karie Schwalbe RN Entered By: Karie Schwalbe on 10/19/2023 16:49:54 -------------------------------------------------------------------------------- Lower Extremity Assessment Details Patient Name: Date of Service: Karla Bigness, [name] 10/19/2023 2:30 PM Medical Record Number: [id number] Patient Account Number: [id number] Date of Birth/Sex: Treating RN: Mar 13, 1961 (61 y.o. Tommye Standard Primary Care Ashton Belote: [name] Other Clinician: Referring Paisleigh Maroney: Treating Antoninette Lerner/Extender: Josephine Cables Weeks in Treatment: 26 Edema Assessment Assessed: [Left: No] [Right: No] Edema: [Left: Yes] [Right: Yes] Calf Left: Right: Point of Measurement: 30 cm From Medial Instep 45.5 cm 45.5 cm Ankle CHARLIANNE, MELICK ([id number]) [id number].pdf Page 4 of 23 Left: Right: Point of Measurement: 9 cm From Medial Instep 23.8 cm 22.3 cm Vascular Assessment Pulses: Dorsalis Pedis Palpable: [Right:No] Extremity colors, hair growth, and conditions: Extremity Color: [Left:Hyperpigmented] [Right:Hyperpigmented] Hair Growth on Extremity: [Left:No]  [Right:No] Temperature of Extremity: [Left:Warm] [Right:Warm] Capillary Refill: [Left:< 3 seconds] [Right:< 3 seconds] Dependent Rubor: [Left:No Yes] [Right:No Yes] Electronic Signature(s) Signed: 10/19/2023 4:48:36 PM By: Zenaida Deed RN, BSN Entered By: Zenaida Deed on 10/19/2023 15:34:51 -------------------------------------------------------------------------------- Multi Wound Chart Details Patient Name: Date of Service: Karla Bigness, [name] 10/19/2023 2:30 PM Medical Record Number: [id number] Patient Account Number: [id number] Date of Birth/Sex: Treating RN: 1961-07-21 (61 y.o. 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, Other Venous Leg Ulcer Primary Etiology: Extremity N/A N/A N/A Secondary Etiology: Chronic Obstructive Pulmonary Chronic Obstructive Pulmonary Chronic Obstructive Pulmonary Comorbid History: Disease (COPD), Sleep Apnea, Disease (COPD), Sleep Apnea, Disease (COPD), Sleep Apnea, Arrhythmia, Congestive Heart Failure, Arrhythmia, Congestive Heart Failure, Arrhythmia, Congestive Heart Failure, Coronary Artery Disease, Coronary Artery Disease, Coronary Artery Disease, Hypertension, Peripheral Arterial Hypertension, Peripheral Arterial Hypertension, Peripheral Arterial Disease, Cirrhosis , Type II Diabetes, Disease, Cirrhosis , Type II Diabetes, Disease, Cirrhosis , Type II Diabetes, Osteoarthritis, Neuropathy  Osteoarthritis, Neuropathy Osteoarthritis, Neuropathy 04/18/2021 08/20/2023 09/06/2023 Date Acquired: 26 8 6  Weeks of Treatment: Open Open Open Wound Status: No No No Wound Recurrence: No No No Clustered Wound: 0.5x0.5x0.3 1.4x1x0.1 0.2x0.2x0.1 Measurements L x W x D (cm) 0.196 1.1 0.031 A (cm) : rea 0.059 0.11 0.003 Volume (cm) : 79.20% 6.60% 73.70% % Reduction in AreaRUBYROSE, FECTEAU ([id number]) [id number] Page 5 of 23 87.50% 6.80% 75.00% % Reduction in Volume: 12 Starting Position 1 (o'clock): 12 Ending Position 1 (o'clock): 0.3 Maximum Distance 1 (cm): Yes No No Undermining: Grade 2 Full Thickness Without Exposed Full Thickness Without Exposed Classification: Support Structures Support Structures Medium Medium Medium Exudate Amount: Serosanguineous Serosanguineous Serosanguineous Exudate Type: red, brown red, brown red, brown Exudate Color: Distinct, outline attached Distinct, outline attached Distinct, outline attached Wound Margin: Medium (34-66%) Small (1-33%) Large (67-100%) Granulation Amount: Red, Pale Red Red, Pink Granulation Quality: Medium (34-66%) Large (67-100%) Small (1-33%) Necrotic Amount: Fat Layer (Subcutaneous Tissue): Yes Fat Layer (Subcutaneous Tissue): Yes Fat Layer (Subcutaneous Tissue): Yes Exposed Structures: Fascia: No Fascia: No Tendon: No Tendon: No Muscle: No Muscle: No Joint: No Joint: No Bone: No Bone: No None None Small (1-33%) Epithelialization: Callus: Yes Excoriation: No Excoriation: No Periwound Skin Texture: Induration: No Induration: No Callus: No Callus: No Crepitus: No Crepitus: No Rash: No Rash: No Scarring: No Scarring: No Maceration: No Maceration: No Maceration: No Periwound Skin Moisture: Dry/Scaly: No Dry/Scaly: No Dry/Scaly: No No Abnormalities Noted Hemosiderin Staining: Yes Hemosiderin Staining: Yes Periwound Skin Color: Atrophie Blanche: No Atrophie  Blanche: No Cyanosis: No Cyanosis: No Ecchymosis: No Ecchymosis: No Erythema: No Erythema: No Mottled: No Mottled: No Pallor: No Pallor: No Rubor: No Rubor: No No Abnormality No Abnormality No Abnormality Temperature: Wound Number: [id number] Photos: Right, Proximal, Posterior Lower Leg Left, Lateral Lower Leg Left, Posterior Lower Leg Wound Location: Blister Blister Blister Wounding 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 Primary Etiology: N/A N/A N/A Secondary Etiology: Chronic Obstructive Pulmonary Chronic Obstructive Pulmonary Chronic Obstructive Pulmonary Comorbid History: Disease (COPD), Sleep Apnea, Disease (COPD), Sleep Apnea, Disease (COPD), Sleep Apnea, Arrhythmia, Congestive Heart Failure, Arrhythmia, Congestive Heart Failure, Arrhythmia, Congestive Heart Failure, Coronary Artery Disease, Coronary Artery Disease, Coronary Artery Disease, Hypertension, Peripheral Arterial Hypertension, Peripheral Arterial Hypertension, Peripheral Arterial Disease, Cirrhosis , Type II Diabetes, Disease, Cirrhosis , Type II Diabetes, Disease, Cirrhosis , Type II Diabetes, Osteoarthritis, Neuropathy Osteoarthritis, Neuropathy Osteoarthritis, Neuropathy 10/04/2023 10/04/2023 10/04/2023 Date Acquired: 2 2 2  Weeks of Treatment: Open Open Open Wound Status: No No No Wound Recurrence: Yes No No Clustered Wound: 0.9x0.6x0.1 1x0.7x0.1 0.6x0.5x0.1 Measurements L x W x D (cm) 0.424 0.55 0.236  A (cm) : rea 0.042 0.055 0.024 Volume (cm) : 86.50% 79.40% 37.40% % Reduction in Area: 86.60% 79.40% 36.80% % Reduction in Volume: No No No Undermining: Full Thickness Without Exposed Full Thickness Without Exposed Full Thickness Without Exposed Classification: Support Structures Support Structures Support Structures Medium Medium Medium Exudate Amount: Serosanguineous Serosanguineous Serosanguineous Exudate Type: red, brown red, brown red, brown Exudate Color: Flat  and Intact N/A Flat and Intact Wound Margin: Large (67-100%) Medium (34-66%) Medium (34-66%) Granulation Amount: Red, Pink Pink Pink Granulation Quality: None Present (0%) Medium (34-66%) Medium (34-66%) Necrotic Amount: Fat Layer (Subcutaneous Tissue): Yes Fat Layer (Subcutaneous Tissue): Yes Fat Layer (Subcutaneous Tissue): Yes Exposed Structures: Fascia: No Fascia: No Fascia: No Tendon: No Tendon: No Tendon: No Muscle: No Muscle: No Muscle: No Joint: No Joint: No Joint: No Bone: No Bone: No Bone: No FLOREINE, LEIER ([id number]) [phone].pdf Page 6 of 23 Small (1-33%) Small (1-33%) Small (1-33%) Epithelialization: No Abnormalities Noted No Abnormalities Noted No Abnormalities Noted Periwound Skin Texture: No Abnormalities Noted No Abnormalities Noted No Abnormalities Noted Periwound Skin Moisture: No Abnormalities Noted Hemosiderin Staining: Yes Hemosiderin Staining: Yes Periwound Skin Color: No Abnormality No Abnormality No Abnormality Temperature: Wound Number: 34 N/A N/A Photos: N/A N/A Left, Proximal, Lateral Lower Leg N/A N/A Wound Location: Gradually Appeared N/A N/A Wounding Event: Diabetic Wound/Ulcer of the Lower N/A N/A Primary Etiology: Extremity Venous Leg Ulcer N/A N/A Secondary Etiology: Chronic Obstructive Pulmonary N/A N/A Comorbid History: Disease (COPD), Sleep Apnea, Arrhythmia, Congestive Heart Failure, Coronary Artery Disease, Hypertension, Peripheral Arterial Disease, Cirrhosis , Type II Diabetes, Osteoarthritis, Neuropathy 10/19/2023 N/A N/A Date Acquired: 0 N/A N/A Weeks of Treatment: Open N/A N/A Wound Status: No N/A N/A Wound Recurrence: No N/A N/A Clustered Wound: 2.2x2x0.1 N/A N/A Measurements L x W x D (cm) 3.456 N/A N/A A (cm) : rea 0.346 N/A N/A Volume (cm) : N/A N/A N/A % Reduction in A rea: N/A N/A N/A % Reduction in Volume: No N/A N/A Undermining: Grade 1 N/A  N/A Classification: Medium N/A N/A Exudate A mount: Serous N/A N/A Exudate Type: amber N/A N/A Exudate Color: Flat and Intact N/A N/A Wound Margin: Large (67-100%) N/A N/A Granulation A mount: Red N/A N/A Granulation Quality: Small (1-33%) N/A N/A Necrotic A mount: Fat Layer (Subcutaneous Tissue): Yes N/A N/A Exposed Structures: Fascia: No Tendon: No Muscle: No Joint: No Bone: No Small (1-33%) N/A N/A Epithelialization: No Abnormalities Noted N/A N/A Periwound Skin Texture: No Abnormalities Noted N/A N/A Periwound Skin Moisture: No Abnormalities Noted N/A N/A Periwound Skin Color: No Abnormality N/A N/A Temperature: Yes N/A N/A Tenderness on Palpation: Treatment Notes Wound #23 (Metatarsal head first) Wound Laterality: Right Cleanser Soap and Water Discharge Instruction: May shower and wash wound with dial antibacterial soap and water prior to dressing change. Wound Cleanser Discharge Instruction: Cleanse the wound with wound cleanser prior to applying a clean dressing using gauze sponges, not tissue or cotton balls. Peri-Wound Care Sween Lotion (Moisturizing lotion) Discharge Instruction: Apply moisturizing lotion as directed Topical Primary Dressing Maxorb Extra Ag+ Alginate Dressing, 2x2 (in/in) Discharge Instruction: Apply to wound bed as instructed Secondary Dressing KIMBERLI, MORRIS ([id number]) [id number] Page 7 of 23 ABD Pad, 8x10 Discharge Instruction: Apply over primary dressing as directed. Secured With [employer] 4x5 (in/yd) Discharge Instruction: Secure with Coban as directed. Kerlix Roll Sterile, 4.5x3.1 (in/yd) Discharge Instruction: Secure with Kerlix as directed. 58M Medipore H Soft Cloth Surgical T ape, 4 x 10 (in/yd) Discharge Instruction: Secure with tape as  directed. Compression Wrap Compression Stockings Add-Ons Wound #29 (Lower Leg) Wound Laterality: Right, Anterior Cleanser Soap and  Water Discharge Instruction: May shower and wash wound with dial antibacterial soap and water prior to dressing change. Wound Cleanser Discharge Instruction: Cleanse the wound with wound cleanser prior to applying a clean dressing using gauze sponges, not tissue or cotton balls. Peri-Wound Care Sween Lotion (Moisturizing lotion) Discharge Instruction: Apply moisturizing lotion as directed Topical Primary Dressing Maxorb Extra Ag+ Alginate Dressing, 2x2 (in/in) Discharge Instruction: Apply to wound bed as instructed Secondary Dressing ABD Pad, 8x10 Discharge Instruction: Apply over primary dressing as directed. Secured With [employer] 4x5 (in/yd) Discharge Instruction: Secure with Coban as directed. Kerlix Roll Sterile, 4.5x3.1 (in/yd) Discharge Instruction: Secure with Kerlix as directed. 70M Medipore H Soft Cloth Surgical T ape, 4 x 10 (in/yd) Discharge Instruction: Secure with tape as directed. Compression Wrap Compression Stockings Add-Ons Wound #30 (Lower Leg) Wound Laterality: Right, Posterior, Distal Cleanser Soap and Water Discharge Instruction: May shower and wash wound with dial antibacterial soap and water prior to dressing change. Wound Cleanser Discharge Instruction: Cleanse the wound with wound cleanser prior to applying a clean dressing using gauze sponges, not tissue or cotton balls. Peri-Wound Care Sween Lotion (Moisturizing lotion) Discharge Instruction: Apply moisturizing lotion as directed Topical Primary Dressing Maxorb Extra Ag+ Alginate Dressing, 2x2 (in/in) Discharge Instruction: Apply to wound bed as instructed Secondary Dressing ABD Pad, 8x10 GIANNAH, KUILAN ([id number]) [id number] Page 8 of 23 Discharge Instruction: Apply over primary dressing as directed. Secured With [employer] 4x5 (in/yd) Discharge Instruction: Secure with Coban as directed. Kerlix Roll Sterile, 4.5x3.1 (in/yd) Discharge  Instruction: Secure with Kerlix as directed. 70M Medipore H Soft Cloth Surgical T ape, 4 x 10 (in/yd) Discharge Instruction: Secure with tape as directed. Compression Wrap Compression Stockings Add-Ons Wound #31 (Lower Leg) Wound Laterality: Right, Posterior, Proximal Cleanser Soap and Water Discharge Instruction: May shower and wash wound with dial antibacterial soap and water prior to dressing change. Wound Cleanser Discharge Instruction: Cleanse the wound with wound cleanser prior to applying a clean dressing using gauze sponges, not tissue or cotton balls. Peri-Wound Care Sween Lotion (Moisturizing lotion) Discharge Instruction: Apply moisturizing lotion as directed Topical Primary Dressing Maxorb Extra Ag+ Alginate Dressing, 2x2 (in/in) Discharge Instruction: Apply to wound bed as instructed Secondary Dressing ABD Pad, 8x10 Discharge Instruction: Apply over primary dressing as directed. Secured With [employer] 4x5 (in/yd) Discharge Instruction: Secure with Coban as directed. Kerlix Roll Sterile, 4.5x3.1 (in/yd) Discharge Instruction: Secure with Kerlix as directed. 70M Medipore H Soft Cloth Surgical T ape, 4 x 10 (in/yd) Discharge Instruction: Secure with tape as directed. Compression Wrap Compression Stockings Add-Ons Wound #32 (Lower Leg) Wound Laterality: Left, Lateral Cleanser Soap and Water Discharge Instruction: May shower and wash wound with dial antibacterial soap and water prior to dressing change. Wound Cleanser Discharge Instruction: Cleanse the wound with wound cleanser prior to applying a clean dressing using gauze sponges, not tissue or cotton balls. Peri-Wound Care Sween Lotion (Moisturizing lotion) Discharge Instruction: Apply moisturizing lotion as directed Topical Primary Dressing Maxorb Extra Ag+ Alginate Dressing, 2x2 (in/in) Discharge Instruction: Apply to wound bed as instructed Secondary Dressing ABD Pad, 8x10 Discharge  Instruction: Apply over primary dressing as directed. KARICIA, SAFFLE ([id number]) [id number] Page 9 of 23 Secured With [employer] 4x5 (in/yd) Discharge Instruction: Secure with Coban as directed. Kerlix Roll Sterile, 4.5x3.1 (in/yd) Discharge Instruction: Secure with Kerlix as directed. 70M Medipore H Soft Cloth Surgical T  ape, 4 x 10 (in/yd) Discharge Instruction: Secure with tape as directed. Compression Wrap Compression Stockings Add-Ons Wound #33 (Lower Leg) Wound Laterality: Left, Posterior Cleanser Soap and Water Discharge Instruction: May shower and wash wound with dial antibacterial soap and water prior to dressing change. Wound Cleanser Discharge Instruction: Cleanse the wound with wound cleanser prior to applying a clean dressing using gauze sponges, not tissue or cotton balls. Peri-Wound Care Sween Lotion (Moisturizing lotion) Discharge Instruction: Apply moisturizing lotion as directed Topical Primary Dressing Maxorb Extra Ag+ Alginate Dressing, 2x2 (in/in) Discharge Instruction: Apply to wound bed as instructed Secondary Dressing ABD Pad, 8x10 Discharge Instruction: Apply over primary dressing as directed. Secured With [employer] 4x5 (in/yd) Discharge Instruction: Secure with Coban as directed. Kerlix Roll Sterile, 4.5x3.1 (in/yd) Discharge Instruction: Secure with Kerlix as directed. 86M Medipore H Soft Cloth Surgical T ape, 4 x 10 (in/yd) Discharge Instruction: Secure with tape as directed. Compression Wrap Compression Stockings Add-Ons Wound #34 (Lower Leg) Wound Laterality: Left, Lateral, Proximal Cleanser Soap and Water Discharge Instruction: May shower and wash wound with dial antibacterial soap and water prior to dressing change. Wound Cleanser Discharge Instruction: Cleanse the wound with wound cleanser prior to applying a clean dressing using gauze sponges, not tissue or cotton balls. Peri-Wound  Care Sween Lotion (Moisturizing lotion) Discharge Instruction: Apply moisturizing lotion as directed Topical Primary Dressing Maxorb Extra Ag+ Alginate Dressing, 2x2 (in/in) Discharge Instruction: Apply to wound bed as instructed Secondary Dressing ABD Pad, 8x10 Discharge Instruction: Apply over primary dressing as directed. LILLYAUNA, GOYA ([id number]) [id number] Page 10 of 23 Secured With [employer] 4x5 (in/yd) Discharge Instruction: Secure with Coban as directed. Kerlix Roll Sterile, 4.5x3.1 (in/yd) Discharge Instruction: Secure with Kerlix as directed. 86M Medipore H Soft Cloth Surgical T ape, 4 x 10 (in/yd) Discharge Instruction: Secure with tape as directed. Compression Wrap Compression Stockings Add-Ons Electronic Signature(s) Signed: 10/19/2023 5:41:20 PM By: Baltazar Najjar MD Entered By: Baltazar Najjar on 10/19/2023 17:06:52 -------------------------------------------------------------------------------- Multi-Disciplinary Care Plan Details Patient Name: Date of Service: Karla Bigness, [name] 10/19/2023 2:30 PM Medical Record Number: [id number] Patient Account Number: [id number] Date of Birth/Sex: Treating RN: Dec 18, 1960 (61 y.o. Tommye Standard Primary Care Yalanda Soderman: [name] Other Clinician: Referring Susie Pousson: Treating Alayssa Flinchum/Extender: Leotis Pain in Treatment: 26 Multidisciplinary Care Plan reviewed with physician Active Inactive Wound/Skin Impairment Nursing Diagnoses: Impaired tissue integrity Goals: Patient/caregiver will verbalize understanding of skin care regimen Date Initiated: 04/19/2023 Target Resolution Date: 11/16/2023 Goal Status: Active Interventions: Assess ulceration(s) every visit Treatment Activities: Skin care regimen initiated : 04/19/2023 Notes: Electronic Signature(s) Signed: 10/19/2023 4:48:36 PM By: Zenaida Deed RN, BSN Entered By: Zenaida Deed on 10/19/2023  15:38:14 -------------------------------------------------------------------------------- Pain Assessment Details Patient Name: Date of Service: Karla Bigness, [name] 10/19/2023 2:30 PM [name] ([id number]) [id number] Page 11 of 23 Medical Record Number: [id number] Patient Account Number: [id number] Date of Birth/Sex: Treating RN: 19-May-1961 (62 y.o. F) Primary Care Venezia Sargeant: [name] Other Clinician: Referring Kimbree Casanas: Treating Jacklyne Baik/Extender: Josephine Cables Weeks in Treatment: 26 Active Problems Location of Pain Severity and Description of Pain Patient Has Paino Yes Site Locations Pain Location: Generalized Pain With Dressing Change: No Duration of the Pain. Constant / Intermittento Constant Rate the pain. Current Pain Level: 8 Worst Pain Level: 10 Least Pain Level: 0 Tolerable Pain Level: 2 Character of Pain Describe the Pain: Shooting, Throbbing, Other: neuropathy pain Pain Management and Medication Current Pain Management: Medication: Yes Is the Current Pain Management  Adequate: Adequate How does your wound impact your activities of daily livingo Sleep: Yes Bathing: No Appetite: No Relationship With Others: No Bladder Continence: No Emotions: Yes Bowel Continence: No Work: No Toileting: No Drive: No Dressing: No Hobbies: No Electronic Signature(s) Signed: 10/19/2023 4:48:36 PM By: Zenaida Deed RN, BSN Entered By: Zenaida Deed on 10/19/2023 15:21:34 -------------------------------------------------------------------------------- Patient/Caregiver Education Details Patient Name: Date of Service: [name] 12/5/2024andnbsp2:30 PM Medical Record Number: [id number] Patient Account Number: [id number] Date of Birth/Gender: Treating RN: March 04, 1961 (62 y.o. Tommye Standard Primary Care Physician: [name] Other Clinician: Referring Physician: Treating Physician/Extender: Leotis Pain in Treatment: 26 Education Assessment Education Provided To: Patient Education Topics Provided Venous: LEEANDRA, OELKE ([id number]) [id number] Page 12 of 23 Methods: Explain/Verbal Responses: Reinforcements needed, State content correctly Wound/Skin Impairment: Methods: Explain/Verbal Responses: Reinforcements needed, State content correctly Electronic Signature(s) Signed: 10/19/2023 4:48:36 PM By: Zenaida Deed RN, BSN Entered By: Zenaida Deed on 10/19/2023 15:38:41 -------------------------------------------------------------------------------- Wound Assessment Details Patient Name: Date of Service: Karla Bigness, [name] 10/19/2023 2:30 PM Medical Record Number: [id number] Patient Account Number: [id number] Date of Birth/Sex: Treating RN: 09/15/1961 (61 y.o. Tommye Standard Primary Care Jaylyn Iyer: [name] Other Clinician: Referring Lisha Vitale: Treating Emalene Welte/Extender: Josephine Cables Weeks in Treatment: 26 Wound Status Wound Number: 23 Primary Diabetic Wound/Ulcer of the Lower Extremity Etiology: Wound Location: Right Metatarsal head first Wound Open Wounding Event: Gradually Appeared Status: Date Acquired: 04/18/2021 Comorbid Chronic Obstructive Pulmonary Disease (COPD), Sleep Apnea, Weeks Of Treatment: 26 History: Arrhythmia, Congestive Heart Failure, Coronary Artery Disease, Clustered Wound: No Hypertension, Peripheral Arterial Disease, Cirrhosis , Type II Diabetes, Osteoarthritis, Neuropathy Photos Wound Measurements Length: (cm) 0.5 Width: (cm) 0.5 Depth: (cm) 0.3 Area: (cm) 0.196 Volume: (cm) 0.059 % Reduction in Area: 79.2% % Reduction in Volume: 87.5% Epithelialization: None Tunneling: No Undermining: Yes Starting Position (o'clock): 12 Ending Position (o'clock): 12 Maximum Distance: (cm) 0.3 Wound Description Classification: Grade 2 Wound Margin: Distinct, outline  attached Exudate Amount: Medium Exudate Type: Serosanguineous Exudate Color: red, brown Foul Odor After Cleansing: No Slough/Fibrino Yes Wound Bed Granulation Amount: Medium (34-66%) Exposed Structure Granulation Quality: Red, Pale Fascia Exposed: No KAMISHA, FINSETH ([id number]) [phone].pdf Page 13 of 23 Necrotic Amount: Medium (34-66%) Fat Layer (Subcutaneous Tissue) Exposed: Yes Necrotic Quality: Adherent Slough Tendon Exposed: No Muscle Exposed: No Joint Exposed: No Bone Exposed: No Periwound Skin Texture Texture Color No Abnormalities Noted: No No Abnormalities Noted: Yes Callus: Yes Temperature / Pain Temperature: No Abnormality Moisture No Abnormalities Noted: No Dry / Scaly: No Maceration: No Treatment Notes Wound #23 (Metatarsal head first) Wound Laterality: Right Cleanser Soap and Water Discharge Instruction: May shower and wash wound with dial antibacterial soap and water prior to dressing change. Wound Cleanser Discharge Instruction: Cleanse the wound with wound cleanser prior to applying a clean dressing using gauze sponges, not tissue or cotton balls. Peri-Wound Care Sween Lotion (Moisturizing lotion) Discharge Instruction: Apply moisturizing lotion as directed Topical Primary Dressing Maxorb Extra Ag+ Alginate Dressing, 2x2 (in/in) Discharge Instruction: Apply to wound bed as instructed Secondary Dressing ABD Pad, 8x10 Discharge Instruction: Apply over primary dressing as directed. Secured With [employer] 4x5 (in/yd) Discharge Instruction: Secure with Coban as directed. Kerlix Roll Sterile, 4.5x3.1 (in/yd) Discharge Instruction: Secure with Kerlix as directed. 42M Medipore H Soft Cloth Surgical T ape, 4 x 10 (in/yd) Discharge Instruction: Secure with tape as directed. Compression Wrap Compression Stockings Add-Ons Electronic Signature(s) Signed: 10/19/2023 4:48:36 PM By: Zenaida Deed  RN, BSN Entered By:  Zenaida Deed on 10/19/2023 15:33:39 -------------------------------------------------------------------------------- Wound Assessment Details Patient Name: Date of Service: [name] 10/19/2023 2:30 PM Medical Record Number: [id number] Patient Account Number: [id number] Date of Birth/Sex: Treating RN: 1961-10-19 (62 y.o. Tommye Standard Primary Care Shawni Volkov: [name] Other Clinician: Referring Majesta Leichter: Treating Keelen Quevedo/Extender: Leotis Pain in Treatment: [address], [place] S ([id number]) [id number] Page 14 of 23 Wound Status Wound Number: 29 Primary Trauma, Other Etiology: Wound Location: Right, Anterior Lower Leg Wound Open Wounding Event: Bump Status: Date Acquired: 08/20/2023 Comorbid Chronic Obstructive Pulmonary Disease (COPD), Sleep Apnea, Weeks Of Treatment: 8 History: Arrhythmia, Congestive Heart Failure, Coronary Artery Disease, Clustered Wound: No Hypertension, Peripheral Arterial Disease, Cirrhosis , Type II Diabetes, Osteoarthritis, Neuropathy Photos Wound Measurements Length: (cm) 1.4 Width: (cm) 1 Depth: (cm) 0.1 Area: (cm) 1.1 Volume: (cm) 0.11 % Reduction in Area: 6.6% % Reduction in Volume: 6.8% Epithelialization: None Tunneling: No Undermining: No Wound Description Classification: Full Thickness Without Exposed Support Structures Wound Margin: Distinct, outline attached Exudate Amount: Medium Exudate Type: Serosanguineous Exudate Color: red, brown Foul Odor After Cleansing: No Slough/Fibrino Yes Wound Bed Granulation Amount: Small (1-33%) Exposed Structure Granulation Quality: Red Fat Layer (Subcutaneous Tissue) Exposed: Yes Necrotic Amount: Large (67-100%) Necrotic Quality: Adherent Slough Periwound Skin Texture Texture Color No Abnormalities Noted: No No Abnormalities Noted: No Callus: No Atrophie Blanche: No Crepitus: No Cyanosis: No Excoriation: No Ecchymosis:  No Induration: No Erythema: No Rash: No Hemosiderin Staining: Yes Scarring: No Mottled: No Pallor: No Moisture Rubor: No No Abnormalities Noted: No Dry / Scaly: No Temperature / Pain Maceration: No Temperature: No Abnormality Treatment Notes Wound #29 (Lower Leg) Wound Laterality: Right, Anterior Cleanser Soap and Water Discharge Instruction: May shower and wash wound with dial antibacterial soap and water prior to dressing change. Wound Cleanser Discharge Instruction: Cleanse the wound with wound cleanser prior to applying a clean dressing using gauze sponges, not tissue or cotton balls. Peri-Wound Care Sween Lotion (Moisturizing lotion) Discharge Instruction: Apply moisturizing lotion as directed Topical ALEYIAH, BRO ([id number]) [id number] Page 15 of 23 Primary Dressing Maxorb Extra Ag+ Alginate Dressing, 2x2 (in/in) Discharge Instruction: Apply to wound bed as instructed Secondary Dressing ABD Pad, 8x10 Discharge Instruction: Apply over primary dressing as directed. Secured With [employer] 4x5 (in/yd) Discharge Instruction: Secure with Coban as directed. Kerlix Roll Sterile, 4.5x3.1 (in/yd) Discharge Instruction: Secure with Kerlix as directed. 42M Medipore H Soft Cloth Surgical T ape, 4 x 10 (in/yd) Discharge Instruction: Secure with tape as directed. Compression Wrap Compression Stockings Add-Ons Electronic Signature(s) Signed: 10/19/2023 4:48:36 PM By: Zenaida Deed RN, BSN Entered By: Zenaida Deed on 10/19/2023 15:30:46 -------------------------------------------------------------------------------- Wound Assessment Details Patient Name: Date of Service: Karla Bigness, [name] 10/19/2023 2:30 PM Medical Record Number: [id number] Patient Account Number: [id number] Date of Birth/Sex: Treating RN: September 20, 1961 (61 y.o. Tommye Standard Primary Care Adeleine Pask: [name] Other Clinician: Referring Deontae Robson: Treating  Severo Beber/Extender: Josephine Cables Weeks in Treatment: 26 Wound Status Wound Number: 30 Primary Venous Leg Ulcer Etiology: Wound Location: Right, Distal, Posterior Lower Leg Wound Open Wounding Event: Gradually Appeared Status: Date Acquired: 09/06/2023 Comorbid Chronic Obstructive Pulmonary Disease (COPD), Sleep Apnea, Weeks Of Treatment: 6 History: Arrhythmia, Congestive Heart Failure, Coronary Artery Disease, Clustered Wound: No Hypertension, Peripheral Arterial Disease, Cirrhosis , Type II Diabetes, Osteoarthritis, Neuropathy Photos Wound Measurements Length: (cm) 0.2 Width: (cm) 0.2 Depth: (cm) 0.1 Area: (cm) 0.031 Volume: (cm) 0.003 % Reduction in Area: 73.7% %  Reduction in Volume: 75% Epithelialization: Small (1-33%) Tunneling: No Undermining: No Wound Description DHWANI, MARSCHALL ([id number]) Classification: Full Thickness Without Exposed Support Structures Wound Margin: Distinct, outline attached Exudate Amount: Medium Exudate Type: Serosanguineous Exudate Color: red, brown [id number].pdf Page 16 of 23 Foul Odor After Cleansing: No Slough/Fibrino Yes Wound Bed Granulation Amount: Large (67-100%) Exposed Structure Granulation Quality: Red, Pink Fascia Exposed: No Necrotic Amount: Small (1-33%) Fat Layer (Subcutaneous Tissue) Exposed: Yes Necrotic Quality: Adherent Slough Tendon Exposed: No Muscle Exposed: No Joint Exposed: No Bone Exposed: No Periwound Skin Texture Texture Color No Abnormalities Noted: No No Abnormalities Noted: No Callus: No Atrophie Blanche: No Crepitus: No Cyanosis: No Excoriation: No Ecchymosis: No Induration: No Erythema: No Rash: No Hemosiderin Staining: Yes Scarring: No Mottled: No Pallor: No Moisture Rubor: No No Abnormalities Noted: No Dry / Scaly: No Temperature / Pain Maceration: No Temperature: No Abnormality Treatment Notes Wound #30 (Lower Leg) Wound Laterality: Right,  Posterior, Distal Cleanser Soap and Water Discharge Instruction: May shower and wash wound with dial antibacterial soap and water prior to dressing change. Wound Cleanser Discharge Instruction: Cleanse the wound with wound cleanser prior to applying a clean dressing using gauze sponges, not tissue or cotton balls. Peri-Wound Care Sween Lotion (Moisturizing lotion) Discharge Instruction: Apply moisturizing lotion as directed Topical Primary Dressing Maxorb Extra Ag+ Alginate Dressing, 2x2 (in/in) Discharge Instruction: Apply to wound bed as instructed Secondary Dressing ABD Pad, 8x10 Discharge Instruction: Apply over primary dressing as directed. Secured With [employer] 4x5 (in/yd) Discharge Instruction: Secure with Coban as directed. Kerlix Roll Sterile, 4.5x3.1 (in/yd) Discharge Instruction: Secure with Kerlix as directed. 76M Medipore H Soft Cloth Surgical T ape, 4 x 10 (in/yd) Discharge Instruction: Secure with tape as directed. Compression Wrap Compression Stockings Add-Ons Electronic Signature(s) Signed: 10/19/2023 4:48:36 PM By: Zenaida Deed RN, BSN Entered By: Zenaida Deed on 10/19/2023 15:31:22 [name] ([id number]) [id number] Page 17 of 23 -------------------------------------------------------------------------------- Wound Assessment Details Patient Name: Date of Service: [name] 10/19/2023 2:30 PM Medical Record Number: [id number] Patient Account Number: [id number] Date of Birth/Sex: Treating RN: Sep 12, 1961 (62 y.o. Tommye Standard Primary Care Ciella Obi: [name] Other Clinician: Referring Arianie Couse: Treating Koa Palla/Extender: Josephine Cables Weeks in Treatment: 26 Wound Status Wound Number: 31 Primary Venous Leg Ulcer Etiology: Wound Location: Right, Proximal, Posterior Lower Leg Wound Open Wounding Event: Blister Status: Date Acquired: 10/04/2023 Comorbid Chronic  Obstructive Pulmonary Disease (COPD), Sleep Apnea, Weeks Of Treatment: 2 History: Arrhythmia, Congestive Heart Failure, Coronary Artery Disease, Clustered Wound: Yes Hypertension, Peripheral Arterial Disease, Cirrhosis , Type II Diabetes, Osteoarthritis, Neuropathy Photos Wound Measurements Length: (cm) 0 Width: (cm) 0 Depth: (cm) 0 Area: (cm) Volume: (cm) .9 % Reduction in Area: 86.5% .6 % Reduction in Volume: 86.6% .1 Epithelialization: Small (1-33%) 0.424 Tunneling: No 0.042 Undermining: No Wound Description Classification: Full Thickness Without Exposed Suppor Wound Margin: Flat and Intact Exudate Amount: Medium Exudate Type: Serosanguineous Exudate Color: red, brown t Structures Foul Odor After Cleansing: No Slough/Fibrino No Wound Bed Granulation Amount: Large (67-100%) Exposed Structure Granulation Quality: Red, Pink Fascia Exposed: No Necrotic Amount: None Present (0%) Fat Layer (Subcutaneous Tissue) Exposed: Yes Tendon Exposed: No Muscle Exposed: No Joint Exposed: No Bone Exposed: No Periwound Skin Texture Texture Color No Abnormalities Noted: Yes No Abnormalities Noted: Yes Moisture Temperature / Pain No Abnormalities Noted: Yes Temperature: No Abnormality Treatment Notes Wound #31 (Lower Leg) Wound Laterality: Right, Posterior, Proximal [address] BAYLYNN, NARDUCCI ([id number]) [id number] Page [phone]  of 23 Discharge Instruction: May shower and wash wound with dial antibacterial soap and water prior to dressing change. Wound Cleanser Discharge Instruction: Cleanse the wound with wound cleanser prior to applying a clean dressing using gauze sponges, not tissue or cotton balls. Peri-Wound Care Sween Lotion (Moisturizing lotion) Discharge Instruction: Apply moisturizing lotion as directed Topical Primary Dressing Maxorb Extra Ag+ Alginate Dressing, 2x2 (in/in) Discharge Instruction: Apply to wound bed as  instructed Secondary Dressing ABD Pad, 8x10 Discharge Instruction: Apply over primary dressing as directed. Secured With [employer] 4x5 (in/yd) Discharge Instruction: Secure with Coban as directed. Kerlix Roll Sterile, 4.5x3.1 (in/yd) Discharge Instruction: Secure with Kerlix as directed. 521M Medipore H Soft Cloth Surgical T ape, 4 x 10 (in/yd) Discharge Instruction: Secure with tape as directed. Compression Wrap Compression Stockings Add-Ons Electronic Signature(s) Signed: 10/19/2023 4:48:36 PM By: Zenaida Deed RN, BSN Entered By: Zenaida Deed on 10/19/2023 15:32:21 -------------------------------------------------------------------------------- Wound Assessment Details Patient Name: Date of Service: Karla Bigness, [name] 10/19/2023 2:30 PM Medical Record Number: [id number] Patient Account Number: [id number] Date of Birth/Sex: Treating RN: Dec 13, 1960 (61 y.o. Tommye Standard Primary Care Cartel Mauss: [name] Other Clinician: Referring Chen Holzman: Treating Nathifa Ritthaler/Extender: Josephine Cables Weeks in Treatment: 26 Wound Status Wound Number: 32 Primary Venous Leg Ulcer Etiology: Wound Location: Left, Lateral Lower Leg Wound Open Wounding Event: Blister Status: Date Acquired: 10/04/2023 Comorbid Chronic Obstructive Pulmonary Disease (COPD), Sleep Apnea, Weeks Of Treatment: 2 History: Arrhythmia, Congestive Heart Failure, Coronary Artery Disease, Clustered Wound: No Hypertension, Peripheral Arterial Disease, Cirrhosis , Type II Diabetes, Osteoarthritis, Neuropathy Photos DULCEMARIA, FAN ([id number]) [phone].pdf Page 19 of 23 Wound Measurements Length: (cm) 1 Width: (cm) 0.7 Depth: (cm) 0.1 Area: (cm) 0.55 Volume: (cm) 0.055 % Reduction in Area: 79.4% % Reduction in Volume: 79.4% Epithelialization: Small (1-33%) Tunneling: No Undermining: No Wound Description Classification: Full Thickness Without Exposed  Support Structures Exudate Amount: Medium Exudate Type: Serosanguineous Exudate Color: red, brown Foul Odor After Cleansing: No Slough/Fibrino Yes Wound Bed Granulation Amount: Medium (34-66%) Exposed Structure Granulation Quality: Pink Fascia Exposed: No Necrotic Amount: Medium (34-66%) Fat Layer (Subcutaneous Tissue) Exposed: Yes Necrotic Quality: Adherent Slough Tendon Exposed: No Muscle Exposed: No Joint Exposed: No Bone Exposed: No Periwound Skin Texture Texture Color No Abnormalities Noted: Yes No Abnormalities Noted: No Hemosiderin Staining: Yes Moisture No Abnormalities Noted: Yes Temperature / Pain Temperature: No Abnormality Treatment Notes Wound #32 (Lower Leg) Wound Laterality: Left, Lateral Cleanser Soap and Water Discharge Instruction: May shower and wash wound with dial antibacterial soap and water prior to dressing change. Wound Cleanser Discharge Instruction: Cleanse the wound with wound cleanser prior to applying a clean dressing using gauze sponges, not tissue or cotton balls. Peri-Wound Care Sween Lotion (Moisturizing lotion) Discharge Instruction: Apply moisturizing lotion as directed Topical Primary Dressing Maxorb Extra Ag+ Alginate Dressing, 2x2 (in/in) Discharge Instruction: Apply to wound bed as instructed Secondary Dressing ABD Pad, 8x10 Discharge Instruction: Apply over primary dressing as directed. Secured With [employer] 4x5 (in/yd) Discharge Instruction: Secure with Coban as directed. Kerlix Roll Sterile, 4.5x3.1 (in/yd) Discharge Instruction: Secure with Kerlix as directed. 521M Medipore H Soft Cloth Surgical Tape, 4 x 10 (in/yd) MCKYNLEY, NITZEL ([id number]) [id number] Page 20 of 23 Discharge Instruction: Secure with tape as directed. Compression Wrap Compression Stockings Add-Ons Electronic Signature(s) Signed: 10/19/2023 4:48:36 PM By: Zenaida Deed RN, BSN Entered By: Zenaida Deed on  10/19/2023 15:33:06 -------------------------------------------------------------------------------- Wound Assessment Details Patient Name: Date of Service: HA LEY, [name] 10/19/2023 2:30  PM Medical Record Number: [id number] Patient Account Number: [id number] Date of Birth/Sex: Treating RN: June 27, 1961 (62 y.o. Tommye Standard Primary Care Julienne Vogler: [name] Other Clinician: Referring Toye Rouillard: Treating Navdeep Halt/Extender: Josephine Cables Weeks in Treatment: 26 Wound Status Wound Number: 33 Primary Venous Leg Ulcer Etiology: Wound Location: Left, Posterior Lower Leg Wound Open Wounding Event: Blister Status: Date Acquired: 10/04/2023 Comorbid Chronic Obstructive Pulmonary Disease (COPD), Sleep Apnea, Weeks Of Treatment: 2 History: Arrhythmia, Congestive Heart Failure, Coronary Artery Disease, Clustered Wound: No Hypertension, Peripheral Arterial Disease, Cirrhosis , Type II Diabetes, Osteoarthritis, Neuropathy Photos Wound Measurements Length: (cm) 0.6 Width: (cm) 0.5 Depth: (cm) 0.1 Area: (cm) 0.236 Volume: (cm) 0.024 % Reduction in Area: 37.4% % Reduction in Volume: 36.8% Epithelialization: Small (1-33%) Tunneling: No Undermining: No Wound Description Classification: Full Thickness Without Exposed Suppor Wound Margin: Flat and Intact Exudate Amount: Medium Exudate Type: Serosanguineous Exudate Color: red, brown t Structures Foul Odor After Cleansing: No Slough/Fibrino Yes Wound Bed Granulation Amount: Medium (34-66%) Exposed Structure Granulation Quality: Pink Fascia Exposed: No Necrotic Amount: Medium (34-66%) Fat Layer (Subcutaneous Tissue) Exposed: Yes Necrotic Quality: Adherent Slough Tendon Exposed: No Muscle Exposed: No Joint Exposed: No Bone Exposed: No ANGELE, GAVIA ([id number]) [phone].pdf Page 21 of 23 Periwound Skin Texture Texture Color No Abnormalities Noted: Yes No Abnormalities Noted:  No Hemosiderin Staining: Yes Moisture No Abnormalities Noted: Yes Temperature / Pain Temperature: No Abnormality Treatment Notes Wound #33 (Lower Leg) Wound Laterality: Left, Posterior Cleanser Soap and Water Discharge Instruction: May shower and wash wound with dial antibacterial soap and water prior to dressing change. Wound Cleanser Discharge Instruction: Cleanse the wound with wound cleanser prior to applying a clean dressing using gauze sponges, not tissue or cotton balls. Peri-Wound Care Sween Lotion (Moisturizing lotion) Discharge Instruction: Apply moisturizing lotion as directed Topical Primary Dressing Maxorb Extra Ag+ Alginate Dressing, 2x2 (in/in) Discharge Instruction: Apply to wound bed as instructed Secondary Dressing ABD Pad, 8x10 Discharge Instruction: Apply over primary dressing as directed. Secured With [employer] 4x5 (in/yd) Discharge Instruction: Secure with Coban as directed. Kerlix Roll Sterile, 4.5x3.1 (in/yd) Discharge Instruction: Secure with Kerlix as directed. 85M Medipore H Soft Cloth Surgical T ape, 4 x 10 (in/yd) Discharge Instruction: Secure with tape as directed. Compression Wrap Compression Stockings Add-Ons Electronic Signature(s) Signed: 10/19/2023 4:48:36 PM By: Zenaida Deed RN, BSN Entered By: Zenaida Deed on 10/19/2023 15:33:51 -------------------------------------------------------------------------------- Wound Assessment Details Patient Name: Date of Service: Karla Bigness, [name] 10/19/2023 2:30 PM Medical Record Number: [id number] Patient Account Number: [id number] Date of Birth/Sex: Treating RN: 1961-10-14 (61 y.o. Tommye Standard Primary Care Matricia Begnaud: [name] Other Clinician: Referring Jadan Rouillard: Treating Rory Xiang/Extender: Josephine Cables Weeks in Treatment: 26 Wound Status Wound Number: 34 Primary Diabetic Wound/Ulcer of the Lower Extremity Etiology: Wound Location: Left, Proximal,  Lateral Lower Leg Secondary Venous Leg Ulcer Wounding Event: Gradually Appeared Etiology: Date Acquired: 10/19/2023 Wound Open Weeks Of Treatment: 0 Status: Clustered Wound: No Comorbid Chronic Obstructive Pulmonary Disease (COPD), Sleep Apnea, MARYDELL, VOHRA ([id number]) [phone].pdf Page 22 of 23 History: Arrhythmia, Congestive Heart Failure, Coronary Artery Disease, Hypertension, Peripheral Arterial Disease, Cirrhosis , Type II Diabetes, Osteoarthritis, Neuropathy Photos Wound Measurements Length: (cm) 2.2 Width: (cm) 2 Depth: (cm) 0.1 Area: (cm) 3.456 Volume: (cm) 0.346 % Reduction in Area: % Reduction in Volume: Epithelialization: Small (1-33%) Tunneling: No Undermining: No Wound Description Classification: Grade 1 Wound Margin: Flat and Intact Exudate Amount: Medium Exudate Type: Serous Exudate Color: amber Foul Odor After Cleansing: No  Slough/Fibrino No Wound Bed Granulation Amount: Large (67-100%) Exposed Structure Granulation Quality: Red Fascia Exposed: No Necrotic Amount: Small (1-33%) Fat Layer (Subcutaneous Tissue) Exposed: Yes Necrotic Quality: Adherent Slough Tendon Exposed: No Muscle Exposed: No Joint Exposed: No Bone Exposed: No Periwound Skin Texture Texture Color No Abnormalities Noted: Yes No Abnormalities Noted: Yes Moisture Temperature / Pain No Abnormalities Noted: Yes Temperature: No Abnormality Tenderness on Palpation: Yes Treatment Notes Wound #34 (Lower Leg) Wound Laterality: Left, Lateral, Proximal Cleanser Soap and Water Discharge Instruction: May shower and wash wound with dial antibacterial soap and water prior to dressing change. Wound Cleanser Discharge Instruction: Cleanse the wound with wound cleanser prior to applying a clean dressing using gauze sponges, not tissue or cotton balls. Peri-Wound Care Sween Lotion (Moisturizing lotion) Discharge Instruction: Apply moisturizing lotion as  directed Topical Primary Dressing Maxorb Extra Ag+ Alginate Dressing, 2x2 (in/in) Discharge Instruction: Apply to wound bed as instructed Secondary Dressing ABD Pad, 8x10 Discharge Instruction: Apply over primary dressing as directed. Secured With BRIDIE, BOATMAN ([id number]) [id number] Page 23 of 23 Coban Self-Adherent Wrap 4x5 (in/yd) Discharge Instruction: Secure with Coban as directed. Kerlix Roll Sterile, 4.5x3.1 (in/yd) Discharge Instruction: Secure with Kerlix as directed. 90M Medipore H Soft Cloth Surgical T ape, 4 x 10 (in/yd) Discharge Instruction: Secure with tape as directed. Compression Wrap Compression Stockings Add-Ons Electronic Signature(s) Signed: 10/19/2023 4:48:36 PM By: Zenaida Deed RN, BSN Entered By: Zenaida Deed on 10/19/2023 15:29:53 -------------------------------------------------------------------------------- Vitals Details Patient Name: Date of Service: Karla Bigness, [name] 10/19/2023 2:30 PM Medical Record Number: [id number] Patient Account Number: [id number] Date of Birth/Sex: Treating RN: September 12, 1961 (61 y.o. F) Primary Care Dashon Mcintire: [name] Other Clinician: Referring Aarit Kashuba: Treating Aily Tzeng/Extender: Josephine Cables Weeks in Treatment: 26 Vital Signs Time Taken: 03:15 Temperature (F): 98.4 Height (in): 64 Pulse (bpm): 77 Weight (lbs): 225 Respiratory Rate (breaths/min): 18 Body Mass Index (BMI): 38.6 Blood Pressure (mmHg): 118/68 Capillary Blood Glucose (mg/dl): 782 Reference Range: 80 - 120 mg / dl Notes glucose per pt report today Electronic Signature(s) Signed: 10/19/2023 4:48:36 PM By: Zenaida Deed RN, BSN Entered By: Zenaida Deed on 10/19/2023 15:19:48

## 2023-10-20 NOTE — Progress Notes (Signed)
AYUMI, DIP ([id number]) [id number] Page 1 of 16 Visit Report for 10/19/2023 HPI Details Patient Name: Date of Service: [name] 10/19/2023 2:30 PM Medical Record Number: [id number] Patient Account Number: [id number] Date of Birth/Sex: Treating RN: 1961/01/07 (62 y.o. [name]) Primary Care Provider: [name] Other Clinician: Referring Provider: Treating Provider/Extender: Josephine Cables Weeks in Treatment: 26 History of Present Illness HPI Description: This 62 year old patient who has a very long significant history of diabetes mellitus, previous alcohol and nicotine abuse, chronic data disease, COPD, diabetes mellitus, height hypertension, critical lower limb ischemia with several wounds being managed at the wound Center at [facility] for over a year. She was recently in the ER at [facility] and was referred to our center. He has had a long history of critical limb ischemia and over a period of time has had balloon angioplasties in March 2016, endarterectomy of the left carotid, lower extremity angiogram and treatment by Dr. Nanetta Batty, several cardiac catheterizations. Most recently she had an x-ray of her left foot while in the ER which showed no acute abnormality. during this ER visit she was started on ciprofloxacin and asked to continue with the wound care physicians at [facility]. Her last ABI done in June 2017 showed the right side to be 0.28 in the left side to be 0.48. Her right toe brachial index was 0.17 on the right and 0.27 on the left. her last hemoglobin A1c was 11.1% She continues to smoke about a pack of cigarettes a day. 03/28/2017 -- -- right foot x-ray -- IMPRESSION:Areas of soft tissue swelling. Mild subluxation second PIP joint. No frank dislocation or fracture. No erosive change or bony destruction. No soft tissue ulceration or radiopaque foreign body evident by radiography. There is plantar fascia  calcification with a nearby inferior calcaneal spur. There are foci of arterial vascular calcification. 04/04/2017 -- the patient continues to be noncompliant and continues to complain of a lot of pain and has not done anything about quitting smoking Readmission: 06/26/18 on evaluation today patient presents for reevaluation she has not been seen in our office since May 2018. Since she was last seen here in our office she has undergone testing at Dr. Durenda Age office where it was revealed that she had an ABI of 0.68 with her previous ABI being 0.64 and a left ABI of 0.38 with previous ABI being 0.34 this study which was performed on 04/05/18 was pretty much equivalent to the study performed on 11/22/17. The findings in the end revealed on the right would appear to be moderate right lower extremity arterial disease on the left Dr. Allyson Sabal states moderate in the report but unfortunately this appears to be much more severe at 0.38 compared to the right. Subsequently the patient was scheduled to have angiography with Dr. Allyson Sabal on 04/12/18. This was however canceled due to the fact that the patient was found to have chronic kidney disease stage III and it was to the point that he did not feel that it will be safe to pursue angiography at that point. She has not been on any antibiotics recently. At this point Dr. Allyson Sabal has not rescheduled anything as far as the injured [place] according to the patient he is somewhat reluctant to do so. Nonetheless with her diminished blood flow this is gonna make it somewhat difficult for her to heal. 07/05/18; this is a patient I have not seen previously. She has very significant PAD as noted above. She  apparently has had revascularization efforts by Dr. Allyson Sabal on the right on 3 occasions to the patient. She was supposed to have an attempted angiography on the left however this was canceled apparently because of stage III chronic renal failure. I will need to research all of  this. She complains of significant pain in the wound and has claudication enough that when she walks to the end of the driveway she has to stop. She is been using silver alginate to the wounds on her legs and Iodoflex to the area on her left second toe. 07/13/18 on evaluation today patient's wounds actually appear to be doing about the same. She has an appointment she tells me within the next month that is September 2019 with Dr. Allyson Sabal to discuss options to see if there's anything else that you can recommend or do for her. Nonetheless obviously what we're trying to do is do what we can to save her leg and in turn prevent any additional worsening or damage. None in the meantime we been mainly trying to manage her ulcers as best we can. 07/27/18; some improvement in the multiplicity of wounds on her left lower extremity and foot. She's been using silver alginate. I was unable to determine that she actually has an appointment with Dr. Allyson Sabal. We are checking into this The patient's wound includes Left lateral foot, left plantar heel, her left anterior calf wound looks close to me, left fourth toe is very close to closed and the left medial calf is perhaps the largest open area READMISSION 02/19/2021 This is a now 62 year old woman with type 2 diabetes and continued cigarette smoking. She has known PAD. She was last in this clinic in 2019 at that point with wounds on her left foot. She left in a nonhealed state. On 06/28/2019 I see she had a left femoropopliteal by vein and vascular with an amputation of the fifth ray. These managed to heal over. Her last arterial studies were in February 2021. This showed an absent waveform at the posterior tibial artery on the right a dampened monophasic dorsalis pedis on the right of 0.44. On the left again the PTA TA was absent her dorsalis pedis was 0.99 triphasic and her great toe pressure was 0.65. This would have been after her revascularization. Once again she tells  me that Dr. Allyson Sabal has done revascularization on the right leg on 3 different occasions. She has a right transmetatarsal amputation apparently done by Dr. Lajoyce Corners remotely but I do not see a note for these right lower extremity revascularization but I may not of looked back far enough. In any case she says that the she has a wound on the plantar aspect of the right transmetatarsal amputation site there is been there for several months. More recently she fell and had a wound on her right medial lower leg she had 5 sutures placed in the ER and then subsequently she has developed an area on the medial lower leg which was a blister that opened up. Past medical history includes type 2 diabetes, PAD, chronic renal failure, congestive heart failure, right transmetatarsal amputation, left fifth ray amputation, continued tobacco abuse, atrial fibrillation and cirrhosis. We did not attempt an ABI on the right leg today because of pain 02/26/2021; patient we admitted to the clinic last week. She has what looks to be 2 areas on her right medial and right anterior lower leg that look more like [name], [name] ([id number]) [id number] Page 2 of 16 venous wounds but  she has 1 on the first met head at the base of her right transmet. She has known severe PAD. She complains of a lot of pain although some of this may be neuropathic. Is difficult to exclude a component of claudication. Use silver alginate on the wounds on her legs and Iodoflex on the area on the first met head. She has an appointment with Dr. Allyson Sabal on 5/25 although I will text him and discussed the situation. She is have poorly controlled diabetic. She has has stage IIIb chronic renal failure 4/22; patient presents for 1 week follow-up. The 2 areas on her right medial and right anterior lower leg appear well-healing. She has been using silver alginate to this area without issues. She has a first met head ulcer and it is unclear how  long this has been there as it was discovered in the ED earlier this month when she was being evaluated for another issue. Iodoflex has been used at this area. 4/29; patient presents for 1 week follow-up. She has been using silver alginate to the leg wounds and Iodoflex to the plantar foot wound. She has had this wrapped with Coban and Kerlix. She has no concerns or complaints today. 5/6; this is a difficult wound on the right plantar foot transmit site. We are not making a lot of progress. She had 2 more venous looking wounds on the right medial and right anterior lower leg 1 of which is healed. Her appointment with Dr. Allyson Sabal is on 5/25 5/16; she did not tolerate the offloading shoe we gave her in fact she had a fall with an abrasion on her right forearm she is now back in the modified small shoe which does not offload her foot properly. Her appoint with Dr. Allyson Sabal is still on 5/25 we have been using Iodoflex. The area on the right anterior lower leg is healed 7/18; patient presents for follow-up however has not been seen in 2 months. She was last seen at the end of May. She had a fall at the end of June and was hospitalized. She has been unable to follow-up since then. For the wound she has only been keeping it covered with gauze. She is scheduled for an aortogram with lower extremity runoff at the end of July. 7/29; patient presents for 2-week follow-up. She has home health and they have been changing her dressings. She denies any issues and has no complaints today. She states she had a procedure where they opened up one of her vessels in her legs. She denies signs of infection. 8/5; patient presents for follow-up. She was recently in the hospital for bradycardia. She was noted to have cellulitis to the left lower extremity and Unna boot was placed due to blisters and increased swelling. She reports improvement to her left leg since being in the hospital and stability check her right plantar  foot wound. She denies signs of infection. 8/23; since I last saw this patient a lot has happened. She still has the area over the right first met head in the setting of previous transmetatarsal amputation. Firstly most importantly she underwent revascularization of her occluded right SFA. She underwent directional atherectomy followed by drug-coated balloon angioplasty. She has no named vessel below the knee hopefully the revascularization of the right SFA will improve collateral flow. It is not felt however that she has any endovascular options. She had follow-up arterial studies noninvasive on 8/9. These showed an ABI of 0.44 at the right PTA. Monophasic waveforms. On  the left her great toe ABI was 0.68. Her follow-up arterial Doppler showed a 50 to 74% stenosis in the proximal SFA and a 50 to 74% stenosis in the distal SFA mild to severe atherosclerosis noted throughout the extremity. Areas of shadowing plaque seen, unable to rule out higher grade stenosis she also had a fall apparently wearing a right foot forefoot off loader that we gave her. She therefore comes in in an ordinary running shoe today. Not certain if this is the fall that ended up in the hospital with bradycardia 9/6 area over the right first metatarsal head in the setting of her previous amputation and severe PAD. She is also a diabetic with known PAD status post attempt at revascularization. She is continued smoker Again the separation of visits in the clinic is somewhat disturbing if we are going to consider her for a total contact cast. She is not wearing anything to offload this stating it causes imbalance especially the forefoot off loader. She basically comes in in bedroom slippers She also had a fall this morning about an hour ago. She has a skin tear on the left dorsal forearm 9/13; areas over the right first metatarsal head in the setting of previous TMA and severe PAD. Again she comes in here in slippers. We have been  using Hydrofera Blue. We use MolecuLight on this which was essentially negative study 9/20; right first metatarsal head in the setting of her previous TMA and severe PAD. Same slipper type shoes. She says she cannot wear a forefoot off loader and for some reason she will not wear surgical shoes. She says she is smoking half a pack per day. 9/30; right first metatarsal head. Absolutely no improvement in fact there is tunneling superiorly very close to bone. She does not offload this properly at all. We use MolecuLight on this 2 weeks ago that did not show any surface bacteria we have been using silver collagen is a dressing She tells me she is down to 3 cigarettes a day I have offered encouragement and a treatment plan 10/6; right first metatarsal head. Exposed bone this week which is not surprising. We have been using silver collagen. She is smoking 5 to 10 cigarettes a day 10/17; she has not been here in 10 days. The bone scraping that I did on 10/6 showed staph lugdunensis, staph epidermidis and Pseudomonas Alcalifenes. I put in for Septra DS 1 p.o. twice daily for 14 days last week would be but we could not reach her to start the antibiotic. Quinolones would have been a good alternative except she has multiple drug interactions. Septra would not cover what ever the Pseudomonas is but I am not really sure of the relevance here. I am going to try her on the Septra for 2 weeks. She has a probing wound on the right TMA that probes to bone. She claims to have just about stop smoking I reviewed her arterial status. She underwent a left fem-tib bypass by Dr. Arbie Cookey in 06/28/2019. Her last angiogram was in July of this year by Dr. Allyson Sabal. This showed an occluded right SFA the popliteal and tibial vessels were also occluded the peroneal vessel filled by collaterals and filled the PT and DP by communicating collaterals. She was not felt to have any additional and vascular options. Dr. Allyson Sabal noted that she he had  previously revascularized her right SFA 3 times. 10/25; she is tolerating the Septra but says it makes her nauseated. I am going to continue  this for an additional 2 weeks perhaps for a total of 6 weeks. Her wound does not look too much different. There is on the right first met metatarsal head. There is probing areas around the tissue that is in the middle of the wound. There is no purulence I cannot feel bone. The original source was for a bone scraping. 11/8; right first metatarsal head. This does not probe to bone and there is no purulence. As far as I can tell she is completed the Septra although there were problems with exact length of time and I think compliance. In any case I gave her 6 weeks. I have reviewed her PAD above. 11/15; right first metatarsal head. Again protruding subcutaneous tissue but this does not have any adherence to surrounding skin. Undermining circumferentially. There is no palpable bone. Not grossly purulent. We have been using Iodoflex to try to get some adherence to clean off the surface but no real improvement. I do not think they actually had enough supplies listening to the patient. She completed the 6 weeks of Septra I gave her, but I am not sure about the compliance with the dates i.e. may have missed days taken 1 a day etc. The patient has a vascular follow-up with Dr. Allyson Sabal this coming Friday i.e. in 3 days. By my read of arterial evaluations I do not think she is felt to be a candidate for any further revascularization. The last angiogram was in July. 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 She continues to smoke now up to 10 cigarettes a day. She is not in any pain. Wound has not improved but is certainly not worsened. I gave her 6 weeks of trimethoprim/sulfamethoxazole which she is completed in some fashion. 11/29 right first metatarsal head previous TMA. Wound actually looks somewhat better today still a lot of callus around the wound on  debris on the wound surface. UNFORTUNATELY the patient missed her appointment with Dr. Allyson Sabal and is not rebooked apparently until sometime in February 12/13; right first metatarsal wound actually looks some smaller today. I did not debride this today. She is using Iodoflex. When she came in last week she had a large abscess on her left anterior lower leg apparently after falling we send her to the ER to have this drained which was done. I cannot see any cultures x-ray was negative. She was apparently given 2 doses of IV antibiotics and discharged on Bactrim DS twice daily which she is still taking. As mentioned I cannot see any cultures. 12/29; culture of the abscess on the left leg I did last time showed Serratia. We gave her antibiotics. I note she was in the ER next day with bleeding which they controlled she is on anticoagulants.She comes in today to clinic and the area on her right plantar foot is miraculously very hyperkeratotic but healed [name], [name] ([id number]) [id number] Page 3 of 16 02/06/2023 Ms. Matisyn Post is a 62 year old female with a past medical history of uncontrolled type 2 diabetes with TMA to the right foot and fifth ray amputation to left foot. She reports a callus to the right first met head and on the lateral aspect of the left foot. She saw podiatry 3 days ago and had the callus debrided. There are no open wounds at this time. She follows up with podiatry in 2 weeks. Readmission: 04-19-2023 upon evaluation today patient appears to be doing well all things considered in regard to her wounds. She does have  several wounds however on her feet 1 on the left foot centrally along the lateral edge and then a wound on the right lower leg more posterior. On the right foot plantar distal at the transmetatarsal amputation site this is the worst. With that being said she tells me this has been going on for quite some time she has been seen by podiatry they  have done some debridements in the past. With that being said based on what we are seeing I believe that the patient's primary issue on the right side is that she is having some issues here with her arterial flow. The ABI on the right is 0.57 on the left is 1.04. With that being said this reading on the right is getting necessitated further evaluation by vascular. She tells me she has been seen by Dr. Gery Pray in the past and he stated there was nothing further to be done for the right leg. With that being said I think that it would be good to have a second opinion with vascular on this. Patient does have a history of diabetes mellitus type 2, peripheral vascular disease, and hypertension. The previous ABIs were performed on January 11, 2023. 04-26-2023 upon evaluation today patient appears to be doing about the same in regard to her wounds. She still has not had the arterial study at this point. I think this is something needs to be done as quickly as possible. Fortunately I do not see any evidence of active infection locally or systemically which is great news. No fevers, chills, nausea, vomiting, or diarrhea. 7/24; the patient has not been here in about a month and a half. She has open wounds on the plantar right foot second metatarsal head in the setting of a previous TMA, she has a new area on the left lateral foot and perhaps traumatic areas on the right anterior and left lateral lower legs. She has poorly controlled edema. The patient has known severe PAD has had multiple interventions in the right SFA. 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 She had an pressure of 60 at the dorsalis pedis giving her an ABI of 0.57 monophasic waveforms here. On the left her study was a lot better with an ABI 1.04 triphasic waveforms. The ABI might be falsely elevated on the left. I think at her last  angiogram she had occluded right SFA popliteal and tibial vessels with the peroneal vessel being the only vessel feeding her foot via collaterals. She comes in this time in basic over-the-counter shoes 06-14-2023 upon evaluation today patient appears to be doing well currently in regard to her wounds all things considered except for the left lateral foot where she has what appears to be a blister this is erythematous as well and concerned about infection. I discussed with her today that I do think we need to see about doing the Vi core culture and subsequently depending on the results of this we will initiate antibiotic treatment as necessary. With that being said right now I think that she is going to need to be aggressive and elevating her legs she has an appointment with vascular coming up around 3 August is what I believe her appointment date is. Nonetheless once we get clearance from the standpoint of vascular she does have good blood flow and we can subsequently try to see what we do about getting her edema  under much better control and then she really needs some much stronger compression than what we currently are able to do. 06-21-2023 upon evaluation today patient appears to be doing pretty well currently in regard to her wounds most of which seem to be showing signs of improvement. She has been require little bit of debridement in regard to the right plantar foot wound I will do this as carefully as possible as we are still working on establishing that she has sufficient blood flow at this point. This right side on previous testing seem to be somewhat questionable. Fortunately I do not see any signs of active infection locally nor systemically at this time. 06-28-2023 patient does have an appointment with vascular on 07-11-2023. With that being said right now she has been having some slow progress but still nonetheless good progress at this point. I am actually very pleased with where we stand. I  do not see any signs of active infection locally or systemically which is great news. No fevers, chills, nausea, vomiting, or diarrhea. 07-05-2023 upon evaluation today patient appears to be doing well currently in regard to her wound. She has been tolerating the dressing changes. She only has 1 wound left on the plantar aspect of the right foot. With that being said she does have a peg assist offloading shoe she has been using at this time. 8/28; patient with a wound over the plantar right foot metatarsal head. She was seen by Dr. Lenell Antu of vascular surgery. Noted that the right ABI was only 0.57 it was felt that she had progression versus a previous study. She had some stenosis in the SFA a 7599% stenosis in the proximal mid SFA mild stenosis in the distal SFA and mild stenosis in the proximal PTA. On the left she had a patent left femoral to ATA bypass graft with mild stenosis It was recommended that she undergo angiography for limb salvage on the right. That is being arranged as we speak. Her wound is on the right first metatarsal head she has been using collagen as the primary dressing. She tells me she is making every effort to stop smoking 07-19-2023 upon evaluation today patient appears to be doing okay in regard to her foot ulcer although her arterial procedure/revascularization keeps getting pushed back. She states that they are talking about pushing back and left nail due to the fact that she changed insurances and I do not have a being "updated insurance card number". With that being said I explained to the patient that should be something that she should be able to get on line easily by making an account. She is also called [place] [facility] and they said they were sending it but it would be about a week before described by next Wednesday. She really does not have time to be holding on this she needs to be proceeding with intervention as quickly as possible. 07-26-2023 upon evaluation today  patient appears to be doing well currently in regard to her wound. This is actually showing signs of looking about the same I did clear a lot of callus up last week this actually looks better to me from an overall visual standpoint than last week though it is a little larger because of the callus I had removed. With that being said I feel like she is actually making really good progress here towards complete closure. No fevers, chills, nausea, vomiting, or diarrhea. 08-09-2023 upon evaluation today patient presents for follow-up concerning her ongoing issues with her  right plantar foot ulcer. She is going to be having arteriogram on Friday that is just just 2 days and hopefully following this it will actually allow for more aggressive healing in regard to the right plantar foot. Today I am going to perform some debridement to clear away some of the necrotic debris. 08-23-2023 upon evaluation patient's wound really appears to be doing about the same. She did have her arteriogram with Dr. Lenell Antu and that was on 9-27- 2024. Unfortunately it was noted that the patient had no good options for further revascularization. They plan to reevaluate there in a month but she is stated to be at "high risk for major amputation". With that being said it does appear they were able to do stenting of the right femoral-popliteal artery but unfortunately they were unable to cross the TP trunk and proximal peroneal occlusion. Subsequently this means that there is really not good blood flow into the ankle and foot which means that the patient really does not have a good chance of getting this to heal. 08-30-2023 upon evaluation today patient appears to be doing well currently in regard to her wound. She has been tolerating the dressing changes without complication and in general I do feel that the patient is doing quite well at this time. Unfortunately with the blood flow being so low and really no real vascular options to  improve blood flow she really has to try to stay off of this is much as possible. 09-06-2023 upon evaluation today patient appears to be doing well currently in regard to the wound on her plantar foot it does not seem to be any worse is not terribly better but again there is really not much we can do from a blood flow standpoint at this time unfortunately. There does not appear to be any signs of active infection locally or systemically which is good news. She does have a wound on the posterior ankle location and the right leg as well. She also has an area on the left lateral foot. 09-13-2023 upon evaluation today patient appears to be doing decently well all things considered in regard to her wounds. Fortunately I do not see any signs of worsening overall I do believe that the patient is making good headway towards closure which is good news. No fevers, chills, nausea, vomiting, or diarrhea. [name], [name] ([id number]) [id number] Page 4 of 16 11/20; since the patient was last here she was admitted to hospital from 11/9 through 09/27/2023. She had atrial fibrillation with rapid ventricular response she underwent successful cardioversion to normal sinus rhythm. She has noted to have stage III chronic renal failure, heart failure with preserved ejection fraction. She was discharged home apparently with a furosemide infusion pump although I do not see that on her discharge summary. She is listed as having torsemide to 100 mg/day. Unfortunately in any case she is gaining weight quite substantially per description of the patient. She has significant bilateral lower extremity edema but nonpitting. She has no open wounds on the left leg and right leg. She was not discharged with any home health and I am not really clear anybody is dressing these wounds. She has known PAD She has the original wounds on the right first met head and right posterior Achilles area. New wounds on the  right anteriorly and right posteriorly and on the left laterally and posteriorly. All her wounds are draining clear edema fluid 12/5; since the patient was last here she was admitted to hospital from  11/26 through 11/29. She had acute on chronic diastolic congestive heart failure. She was also in the ER on 12/3 with a small abscess in her right buttock. She was put on doxycycline. She had her legs wrapped when she left the hospital but she took these off 2-3 deep days ago. There is very significant bilateral lower extremity edema which she is tense. She has wounds on her right first metatarsal head right anterior lower extremity right posterior lower extremity x 2 and her left lateral and left posterior lower extremity. The areas on the lower extremity looked mostly like venous wounds although her complicating systemic fluid volume overload is no doubt contributing. We have been using silver alginate and Kerlix Coban. As mentioned she took these off a few days ago The patient saw Dr. Lenell Antu on 10/10/2023. She is status post right femoral-popliteal angioplasty and stenting Electronic Signature(s) Signed: 10/19/2023 5:41:20 PM By: Baltazar Najjar MD Entered By: Baltazar Najjar on 10/19/2023 14:10:37 -------------------------------------------------------------------------------- Physical Exam Details Patient Name: Date of Service: [name], [name] 10/19/2023 2:30 PM Medical Record Number: [id number] Patient Account Number: [id number] Date of Birth/Sex: Treating RN: 1960/12/15 (61 y.o. [name]) Primary Care Provider: [name] Other Clinician: Referring Provider: Treating Provider/Extender: Josephine Cables Weeks in Treatment: 26 Constitutional Sitting or standing Blood Pressure is within target range for patient.. Pulse regular and within target range for patient.[name] Respirations regular, non-labored and within target range.. Temperature is normal and within the target range for  the patient.[name] Appears in no distress. Respiratory . Cardiovascular Tense peripheral edema bilaterally.. Notes Wound exam; she has scattered superficial wounds over right anterior and more left lateral and posterior lower legs. These do not have any depth. She has poorly controlled edema in both legs. An area on her right first met metatarsal head fortunately does not look infected. Electronic Signature(s) Signed: 10/19/2023 5:41:20 PM By: Baltazar Najjar MD Entered By: Baltazar Najjar on 10/19/2023 14:12:05 -------------------------------------------------------------------------------- Physician Orders Details Patient Name: Date of Service: [name], [name] 10/19/2023 2:30 PM Medical Record Number: [id number] Patient Account Number: [id number] Date of Birth/Sex: Treating RN: 10/31/1961 (61 y.o. [name] Primary Care Provider: [name] Other Clinician: Referring Provider: Treating Provider/Extender: Leotis Pain in Treatment: [address], [place] S ([id number]) [id number] Page 5 of 16 Verbal / Phone Orders: No Diagnosis Coding ICD-10 Coding Code Description 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 L97.522 Non-pressure chronic ulcer of other part of left foot with fat layer exposed E11.622 Type 2 diabetes mellitus with other skin ulcer L97.812 Non-pressure chronic ulcer of other part of right lower leg with fat layer exposed I73.89 Other specified peripheral vascular diseases I87.331 Chronic venous hypertension (idiopathic) with ulcer and inflammation of right lower extremity L97.828 Non-pressure chronic ulcer of other part of left lower leg with other specified severity Follow-up Appointments ppointment in 2 weeks. - Dr Leanord Hawking 11/02/23 at 12:30pm Return A Return appointment in 3 weeks. - Dr Leanord Hawking Nurse Visit: - tuesday or  wednesday Anesthetic (In clinic) Topical Lidocaine 5% applied to wound bed Bathing/ Shower/ Hygiene May shower with protection but do not get wound dressing(s) wet. Protect dressing(s) with water repellant cover (for example, large plastic bag) or a cast cover and may then take shower. Edema Control - Orders / Instructions Elevate legs to the level of the heart or above for 30 minutes daily and/or when sitting for 3-4  times a day throughout the day. Avoid standing for long periods of time. Off-Loading Open toe surgical shoe to: - with Peg Assist in shoe. Limit walking on foot. Stay off the foot to minimize pressure to bottom of foot. Additional Orders / Instructions Stop/Decrease Smoking Follow Nutritious Diet Non Wound Condition Left Lower Extremity pply the following to affected area as directed: - left foot- apply betadine to affected area cover with ABD pad, secure with kerlix tubigrip size E. A Home Health Wound #23 Right Metatarsal head first Admit to Home Health for skilled nursing wound care. May utilize formulary equivalent dressing for wound treatment orders unless otherwise specified. [facility] New wound care orders this week; continue Home Health for wound care. May utilize formulary equivalent dressing for wound treatment orders unless otherwise specified. - Pt. will come to Wound Clinic every 2 weeks-Please perform wound dressing changes 2 x for 1 week (i.e. the week pt. is NOT coming to the Wound Clinic) and the alternate week-[facility]. [facility] change wound dressings x1 that week. Wound dressings- Silver alginate,cover with gauze or ABD and Bilateral Kerlix and Coban to Lower Legs. Other Home Health Orders/Instructions: - [facility] Wound #29 Right,Anterior Lower Leg Admit to Home Health for skilled nursing wound care. May utilize formulary equivalent dressing for wound treatment orders unless otherwise specified. [facility] New wound care orders this week; continue Home Health for wound care. May utilize formulary equivalent dressing for wound treatment orders unless otherwise specified. - Pt. will come to Wound Clinic every 2 weeks-Please perform wound dressing changes 2 x for 1 week (i.e. the week pt. is NOT coming to the Wound Clinic) and the alternate week-Pt. will [facility] an [facility] change wound dressings x1 that week. Wound dressings- Silver alginate,cover with gauze or ABD and Bilateral Kerlix and Coban to Lower Legs. Other Home Health Orders/Instructions: - [facility] Wound #30 Right,Distal,Posterior Lower Leg Admit to Home Health for skilled nursing wound care. May utilize formulary equivalent dressing for wound treatment orders unless otherwise specified. [facility] New wound care orders this week; continue Home Health for wound care. May utilize formulary equivalent dressing for wound treatment orders unless otherwise specified. - Pt. will come to Wound Clinic every 2 weeks-Please perform wound dressing changes 2 x for 1 week (i.e. the week pt. is NOT coming to the Wound Clinic) and the alternate week-Pt. will [facility] an appointment [facility] change wound dressings x1 that week. Wound dressings- Silver alginate,cover with gauze or ABD and Bilateral Kerlix and Coban to Lower Legs. Other Home Health Orders/Instructions: - [facility] Wound #31 Right,Proximal,Posterior Lower Leg Admit to Home Health for skilled nursing wound care. May utilize formulary equivalent dressing for wound treatment orders unless otherwise specified. [facility] New wound care orders this week; continue Home Health for wound care. May utilize formulary equivalent dressing for wound treatment orders unless otherwise specified. - Pt. will come to Wound Clinic every 2 weeks-Please perform wound dressing changes 2 x for 1 week (i.e. the week pt. is NOT coming to  the Wound Clinic) and the alternate week-Pt. will [facility] change wound dressings x1 that week. Wound dressings- Silver alginate,cover with gauze or ABD and Bilateral Kerlix and Coban to Lower Legs. Other Home Health Orders/Instructions: - [facility] AHJA, CEGIELSKI ([id number]) [id number]  Page 6 of 16 Wound #32 Left,Lateral Lower Leg Admit to Home Health for skilled nursing wound care. May utilize formulary equivalent dressing for wound treatment orders unless otherwise specified. [facility] New wound care orders this week; continue Home Health for wound care. May utilize formulary equivalent dressing for wound treatment orders unless otherwise specified. - Pt. will come to Wound Clinic every 2 weeks-Please perform wound dressing changes 2 x for 1 week (i.e. the week pt. is NOT coming to the Wound Clinic) and the alternate week-Pt. will [facility] an appointment [facility] change wound dressings x1 that week. Wound dressings- Silver alginate,cover with gauze or ABD and Bilateral Kerlix and Coban to Lower Legs. Other Home Health Orders/Instructions: - [facility] Wound #33 Left,Posterior Lower Leg Admit to Home Health for skilled nursing wound care. May utilize formulary equivalent dressing for wound treatment orders unless otherwise specified. [facility] New wound care orders this week; continue Home Health for wound care. May utilize formulary equivalent dressing for wound treatment orders unless otherwise specified. - Pt. will come to Wound Clinic every 2 weeks-Please perform wound dressing changes 2 x for 1 week (i.e. the week pt. is NOT coming to the Wound Clinic) and the alternate week-Pt. will [facility] an appointment [facility] change wound dressings x1 that week. Wound dressings- Silver alginate,cover with gauze or ABD and Bilateral Kerlix and Coban to Lower Legs. Other Home  Health Orders/Instructions: - [facility] Wound Treatment Wound #23 - Metatarsal head first Wound Laterality: Right Cleanser: Soap and Water 3 x Per Week/30 Days Discharge Instructions: May shower and wash wound with dial antibacterial soap and water prior to dressing change. Cleanser: Wound Cleanser 3 x Per Week/30 Days Discharge Instructions: Cleanse the wound with wound cleanser prior to applying a clean dressing using gauze sponges, not tissue or cotton balls. Peri-Wound Care: Sween Lotion (Moisturizing lotion) 3 x Per Week/30 Days Discharge Instructions: Apply moisturizing lotion as directed Prim Dressing: Maxorb Extra Ag+ Alginate Dressing, 2x2 (in/in) 3 x Per Week/30 Days ary Discharge Instructions: Apply to wound bed as instructed Secondary Dressing: ABD Pad, 8x10 3 x Per Week/30 Days Discharge Instructions: Apply over primary dressing as directed. Secured With: Coban Self-Adherent Wrap 4x5 (in/yd) 3 x Per Week/30 Days Discharge Instructions: Secure with Coban as directed. Secured With: [employer], 4.5x3.1 (in/yd) 3 x Per Week/30 Days Discharge Instructions: Secure with Kerlix as directed. Secured With: 60M Medipore H Soft Cloth Surgical T ape, 4 x 10 (in/yd) 3 x Per Week/30 Days Discharge Instructions: Secure with tape as directed. Wound #29 - Lower Leg Wound Laterality: Right, Anterior Cleanser: Soap and Water 3 x Per Week/30 Days Discharge Instructions: May shower and wash wound with dial antibacterial soap and water prior to dressing change. Cleanser: Wound Cleanser 3 x Per Week/30 Days Discharge Instructions: Cleanse the wound with wound cleanser prior to applying a clean dressing using gauze sponges, not tissue or cotton balls. Peri-Wound Care: Sween Lotion (Moisturizing lotion) 3 x Per Week/30 Days Discharge Instructions: Apply moisturizing lotion as directed Prim Dressing: Maxorb Extra Ag+ Alginate Dressing, 2x2 (in/in) 3 x Per Week/30 Days ary Discharge  Instructions: Apply to wound bed as instructed Secondary Dressing: ABD Pad, 8x10 3 x Per Week/30 Days Discharge Instructions: Apply over primary dressing as directed. Secured With: Coban Self-Adherent Wrap 4x5 (in/yd) 3 x Per Week/30 Days Discharge Instructions: Secure with Coban as directed. Secured With: [employer], 4.5x3.1 (in/yd)  3 x Per Week/30 Days Discharge Instructions: Secure with Kerlix as directed. Secured With: 14M Medipore H Soft Cloth Surgical T ape, 4 x 10 (in/yd) 3 x Per Week/30 Days Discharge Instructions: Secure with tape as directed. Wound #30 - Lower Leg Wound Laterality: Right, Posterior, Distal Cleanser: Soap and Water 3 x Per Week/30 Days Discharge Instructions: May shower and wash wound with dial antibacterial soap and water prior to dressing change. Cleanser: Wound Cleanser 3 x Per Week/30 Days Discharge Instructions: Cleanse the wound with wound cleanser prior to applying a clean dressing using gauze sponges, not tissue or cotton balls. [name], [name] ([id number]) [id number] Page 7 of 16 Peri-Wound Care: Sween Lotion (Moisturizing lotion) 3 x Per Week/30 Days Discharge Instructions: Apply moisturizing lotion as directed Prim Dressing: Maxorb Extra Ag+ Alginate Dressing, 2x2 (in/in) 3 x Per Week/30 Days ary Discharge Instructions: Apply to wound bed as instructed Secondary Dressing: ABD Pad, 8x10 3 x Per Week/30 Days Discharge Instructions: Apply over primary dressing as directed. Secured With: Coban Self-Adherent Wrap 4x5 (in/yd) 3 x Per Week/30 Days Discharge Instructions: Secure with Coban as directed. Secured With: [employer], 4.5x3.1 (in/yd) 3 x Per Week/30 Days Discharge Instructions: Secure with Kerlix as directed. Secured With: 14M Medipore H Soft Cloth Surgical T ape, 4 x 10 (in/yd) 3 x Per Week/30 Days Discharge Instructions: Secure with tape as directed. Wound #31 - Lower Leg Wound Laterality: Right,  Posterior, Proximal Cleanser: Soap and Water 3 x Per Week/30 Days Discharge Instructions: May shower and wash wound with dial antibacterial soap and water prior to dressing change. Cleanser: Wound Cleanser 3 x Per Week/30 Days Discharge Instructions: Cleanse the wound with wound cleanser prior to applying a clean dressing using gauze sponges, not tissue or cotton balls. Peri-Wound Care: Sween Lotion (Moisturizing lotion) 3 x Per Week/30 Days Discharge Instructions: Apply moisturizing lotion as directed Prim Dressing: Maxorb Extra Ag+ Alginate Dressing, 2x2 (in/in) 3 x Per Week/30 Days ary Discharge Instructions: Apply to wound bed as instructed Secondary Dressing: ABD Pad, 8x10 3 x Per Week/30 Days Discharge Instructions: Apply over primary dressing as directed. Secured With: Coban Self-Adherent Wrap 4x5 (in/yd) 3 x Per Week/30 Days Discharge Instructions: Secure with Coban as directed. Secured With: [employer], 4.5x3.1 (in/yd) 3 x Per Week/30 Days Discharge Instructions: Secure with Kerlix as directed. Secured With: 14M Medipore H Soft Cloth Surgical T ape, 4 x 10 (in/yd) 3 x Per Week/30 Days Discharge Instructions: Secure with tape as directed. Wound #32 - Lower Leg Wound Laterality: Left, Lateral Cleanser: Soap and Water 3 x Per Week/30 Days Discharge Instructions: May shower and wash wound with dial antibacterial soap and water prior to dressing change. Cleanser: Wound Cleanser 3 x Per Week/30 Days Discharge Instructions: Cleanse the wound with wound cleanser prior to applying a clean dressing using gauze sponges, not tissue or cotton balls. Peri-Wound Care: Sween Lotion (Moisturizing lotion) 3 x Per Week/30 Days Discharge Instructions: Apply moisturizing lotion as directed Prim Dressing: Maxorb Extra Ag+ Alginate Dressing, 2x2 (in/in) 3 x Per Week/30 Days ary Discharge Instructions: Apply to wound bed as instructed Secondary Dressing: ABD Pad, 8x10 3 x Per Week/30  Days Discharge Instructions: Apply over primary dressing as directed. Secured With: Coban Self-Adherent Wrap 4x5 (in/yd) 3 x Per Week/30 Days Discharge Instructions: Secure with Coban as directed. Secured With: [employer], 4.5x3.1 (in/yd) 3 x Per Week/30 Days Discharge Instructions: Secure with Kerlix as directed. Secured With: 14M Medipore H Soft Cloth Surgical T ape, 4  x 10 (in/yd) 3 x Per Week/30 Days Discharge Instructions: Secure with tape as directed. Wound #33 - Lower Leg Wound Laterality: Left, Posterior Cleanser: Soap and Water 3 x Per Week/30 Days Discharge Instructions: May shower and wash wound with dial antibacterial soap and water prior to dressing change. Cleanser: Wound Cleanser 3 x Per Week/30 Days Discharge Instructions: Cleanse the wound with wound cleanser prior to applying a clean dressing using gauze sponges, not tissue or cotton balls. Peri-Wound Care: Sween Lotion (Moisturizing lotion) 3 x Per Week/30 Days [name], [name] ([id number]) [phone].pdf Page 8 of 16 Discharge Instructions: Apply moisturizing lotion as directed Prim Dressing: Maxorb Extra Ag+ Alginate Dressing, 2x2 (in/in) 3 x Per Week/30 Days ary Discharge Instructions: Apply to wound bed as instructed Secondary Dressing: ABD Pad, 8x10 3 x Per Week/30 Days Discharge Instructions: Apply over primary dressing as directed. Secured With: Coban Self-Adherent Wrap 4x5 (in/yd) 3 x Per Week/30 Days Discharge Instructions: Secure with Coban as directed. Secured With: [employer], 4.5x3.1 (in/yd) 3 x Per Week/30 Days Discharge Instructions: Secure with Kerlix as directed. Secured With: 54M Medipore H Soft Cloth Surgical T ape, 4 x 10 (in/yd) 3 x Per Week/30 Days Discharge Instructions: Secure with tape as directed. Wound #34 - Lower Leg Wound Laterality: Left, Lateral, Proximal Cleanser: Soap and Water 3 x Per Week/30 Days Discharge Instructions: May shower and wash wound  with dial antibacterial soap and water prior to dressing change. Cleanser: Wound Cleanser 3 x Per Week/30 Days Discharge Instructions: Cleanse the wound with wound cleanser prior to applying a clean dressing using gauze sponges, not tissue or cotton balls. Peri-Wound Care: Sween Lotion (Moisturizing lotion) 3 x Per Week/30 Days Discharge Instructions: Apply moisturizing lotion as directed Prim Dressing: Maxorb Extra Ag+ Alginate Dressing, 2x2 (in/in) 3 x Per Week/30 Days ary Discharge Instructions: Apply to wound bed as instructed Secondary Dressing: ABD Pad, 8x10 3 x Per Week/30 Days Discharge Instructions: Apply over primary dressing as directed. Secured With: Coban Self-Adherent Wrap 4x5 (in/yd) 3 x Per Week/30 Days Discharge Instructions: Secure with Coban as directed. Secured With: [employer], 4.5x3.1 (in/yd) 3 x Per Week/30 Days Discharge Instructions: Secure with Kerlix as directed. Secured With: 54M Medipore H Soft Cloth Surgical T ape, 4 x 10 (in/yd) 3 x Per Week/30 Days Discharge Instructions: Secure with tape as directed. Electronic Signature(s) Signed: 10/19/2023 5:01:11 PM By: Karie Schwalbe RN Signed: 10/19/2023 5:41:20 PM By: Baltazar Najjar MD Entered By: Karie Schwalbe on 10/19/2023 13:05:21 -------------------------------------------------------------------------------- Problem List Details Patient Name: Date of Service: [name], [name] 10/19/2023 2:30 PM Medical Record Number: [id number] Patient Account Number: [id number] Date of Birth/Sex: Treating RN: 1961/04/16 (61 y.o. Tommye Standard Primary Care Provider: [name] Other Clinician: Referring Provider: Treating Provider/Extender: Josephine Cables Weeks in Treatment: 26 Active Problems ICD-10 Encounter Code Description Active Date MDM Diagnosis 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 2 diabetes mellitus with foot ulcer 04/19/2023 No Yes [name], [name] ([id number]) [phone].pdf  Page 9 of 16 L97.512 Non-pressure chronic ulcer of other part of right foot with fat layer exposed 04/19/2023 No Yes L97.522 Non-pressure chronic ulcer of other part of left foot with fat layer exposed 04/19/2023 No Yes E11.622 Type 2 diabetes mellitus with other skin ulcer 04/19/2023 No Yes L97.812 Non-pressure chronic ulcer of other part of right lower leg with fat layer 04/19/2023 No Yes exposed I73.89 Other specified peripheral vascular diseases 04/19/2023 No Yes I87.331 Chronic venous hypertension (idiopathic) with ulcer and inflammation of right 04/19/2023 No Yes  lower extremity L97.828 Non-pressure chronic ulcer of other part of left lower leg with other specified 10/04/2023 No Yes severity Inactive Problems Resolved Problems Electronic Signature(s) Signed: 10/19/2023 5:41:20 PM By: Baltazar Najjar MD Previous Signature: 10/19/2023 4:48:36 PM Version By: Zenaida Deed RN, BSN Entered By: Baltazar Najjar on 10/19/2023 14:06:40 -------------------------------------------------------------------------------- Progress Note Details Patient Name: Date of Service: [name], [name] 10/19/2023 2:30 PM Medical Record Number: [id number] Patient Account Number: [id number] Date of Birth/Sex: Treating RN: 1961-07-10 (61 y.o. [name]) Primary Care Provider: [name] Other Clinician: Referring Provider: Treating Provider/Extender: Josephine Cables Weeks in Treatment: 26 Subjective History of Present Illness (HPI) This 62 year old patient who has a very long significant history of diabetes mellitus, previous alcohol and nicotine abuse, chronic data disease, COPD, diabetes mellitus, height hypertension, critical lower limb ischemia with several wounds being managed at the wound Center at [facility] for over a year. She was recently in the ER at [facility] and was referred to our center. He has had a long history of critical limb ischemia and over a period of time has had balloon  angioplasties in March 2016, endarterectomy of the left carotid, lower extremity angiogram and treatment by Dr. Nanetta Batty, several cardiac catheterizations. Most recently she had an x-ray of her left foot while in the ER which showed no acute abnormality. during this ER visit she was started on ciprofloxacin and asked to continue with the wound care physicians at [facility] Her last ABI done in June 2017 showed the right side to be 0.28 in the left side to be 0.48. Her right toe brachial index was 0.17 on the right and 0.27 on the left. her last hemoglobin A1c was 11.1% She continues to smoke about a pack of cigarettes a day. 03/28/2017 -- -- right foot x-ray -- IMPRESSION:Areas of soft tissue swelling. Mild subluxation second PIP joint. No frank dislocation or fracture. No erosive change or bony destruction. No soft tissue ulceration or radiopaque foreign body evident by radiography. There is plantar fascia calcification with a nearby inferior calcaneal spur. There are foci of arterial vascular calcification. 04/04/2017 -- the patient continues to be noncompliant and continues to complain of a lot of pain and has not done anything about quitting smoking Readmission: [name], [name] ([id number]) [id number].pdf Page 10 of 16 06/26/18 on evaluation today patient presents for reevaluation she has not been seen in our office since May 2018. Since she was last seen here in our office she has undergone testing at Dr. Durenda Age office where it was revealed that she had an ABI of 0.68 with her previous ABI being 0.64 and a left ABI of 0.38 with previous ABI being 0.34 this study which was performed on 04/05/18 was pretty much equivalent to the study performed on 11/22/17. The findings in the end revealed on the right would appear to be moderate right lower extremity arterial disease on the left Dr. Allyson Sabal states moderate in the report but unfortunately this appears to be much  more severe at 0.38 compared to the right. Subsequently the patient was scheduled to have angiography with Dr. Allyson Sabal on 04/12/18. This was however canceled due to the fact that the patient was found to have chronic kidney disease stage III and it was to the point that he did not feel that it will be safe to pursue angiography at that point. She has not been on any antibiotics recently. At this point Dr. Allyson Sabal has not rescheduled anything as far  as the injured Reserve according to the patient he is somewhat reluctant to do so. Nonetheless with her diminished blood flow this is gonna make it somewhat difficult for her to heal. 07/05/18; this is a patient I have not seen previously. She has very significant PAD as noted above. She apparently has had revascularization efforts by Dr. Allyson Sabal on the right on 3 occasions to the patient. She was supposed to have an attempted angiography on the left however this was canceled apparently because of stage III chronic renal failure. I will need to research all of this. She complains of significant pain in the wound and has claudication enough that when she walks to the end of the driveway she has to stop. She is been using silver alginate to the wounds on her legs and Iodoflex to the area on her left second toe. 07/13/18 on evaluation today patient's wounds actually appear to be doing about the same. She has an appointment she tells me within the next month that is September 2019 with Dr. Allyson Sabal to discuss options to see if there's anything else that you can recommend or do for her. Nonetheless obviously what we're trying to do is do what we can to save her leg and in turn prevent any additional worsening or damage. None in the meantime we been mainly trying to manage her ulcers as best we can. 07/27/18; some improvement in the multiplicity of wounds on her left lower extremity and foot. She's been using silver alginate. I was unable to determine that she actually has an  appointment with Dr. Allyson Sabal. We are checking into this The patient's wound includes Left lateral foot, left plantar heel, her left anterior calf wound looks close to me, left fourth toe is very close to closed and the left medial calf is perhaps the largest open area READMISSION 02/19/2021 This is a now 62 year old woman with type 2 diabetes and continued cigarette smoking. She has known PAD. She was last in this clinic in 2019 at that point with wounds on her left foot. She left in a nonhealed state. On 06/28/2019 I see she had a left femoropopliteal by vein and vascular with an amputation of the fifth ray. These managed to heal over. Her last arterial studies were in February 2021. This showed an absent waveform at the posterior tibial artery on the right a dampened monophasic dorsalis pedis on the right of 0.44. On the left again the PTA TA was absent her dorsalis pedis was 0.99 triphasic and her great toe pressure was 0.65. This would have been after her revascularization. Once again she tells me that Dr. Allyson Sabal has done revascularization on the right leg on 3 different occasions. She has a right transmetatarsal amputation apparently done by Dr. Lajoyce Corners remotely but I do not see a note for these right lower extremity revascularization but I may not of looked back far enough. In any case she says that the she has a wound on the plantar aspect of the right transmetatarsal amputation site there is been there for several months. More recently she fell and had a wound on her right medial lower leg she had 5 sutures placed in the ER and then subsequently she has developed an area on the medial lower leg which was a blister that opened up. Past medical history includes type 2 diabetes, PAD, chronic renal failure, congestive heart failure, right transmetatarsal amputation, left fifth ray amputation, continued tobacco abuse, atrial fibrillation and cirrhosis. We did not attempt an ABI  on the right leg today  because of pain 02/26/2021; patient we admitted to the clinic last week. She has what looks to be 2 areas on her right medial and right anterior lower leg that look more like venous wounds but she has 1 on the first met head at the base of her right transmet. She has known severe PAD. She complains of a lot of pain although some of this may be neuropathic. Is difficult to exclude a component of claudication. Use silver alginate on the wounds on her legs and Iodoflex on the area on the first met head. She has an appointment with Dr. Allyson Sabal on 5/25 although I will text him and discussed the situation. She is have poorly controlled diabetic. She has has stage IIIb chronic renal failure 4/22; patient presents for 1 week follow-up. The 2 areas on her right medial and right anterior lower leg appear well-healing. She has been using silver alginate to this area without issues. She has a first met head ulcer and it is unclear how long this has been there as it was discovered in the ED earlier this month when she was being evaluated for another issue. Iodoflex has been used at this area. 4/29; patient presents for 1 week follow-up. She has been using silver alginate to the leg wounds and Iodoflex to the plantar foot wound. She has had this wrapped with Coban and Kerlix. She has no concerns or complaints today. 5/6; this is a difficult wound on the right plantar foot transmit site. We are not making a lot of progress. She had 2 more venous looking wounds on the right medial and right anterior lower leg 1 of which is healed. Her appointment with Dr. Allyson Sabal is on 5/25 5/16; she did not tolerate the offloading shoe we gave her in fact she had a fall with an abrasion on her right forearm she is now back in the modified small shoe which does not offload her foot properly. Her appoint with Dr. Allyson Sabal is still on 5/25 we have been using Iodoflex. The area on the right anterior lower leg is healed 7/18; patient presents  for follow-up however has not been seen in 2 months. She was last seen at the end of May. She had a fall at the end of June and was hospitalized. She has been unable to follow-up since then. For the wound she has only been keeping it covered with gauze. She is scheduled for an aortogram with lower extremity runoff at the end of July. 7/29; patient presents for 2-week follow-up. She has home health and they have been changing her dressings. She denies any issues and has no complaints today. She states she had a procedure where they opened up one of her vessels in her legs. She denies signs of infection. 8/5; patient presents for follow-up. She was recently in the hospital for bradycardia. She was noted to have cellulitis to the left lower extremity and Unna boot was placed due to blisters and increased swelling. She reports improvement to her left leg since being in the hospital and stability check her right plantar foot wound. She denies signs of infection. 8/23; since I last saw this patient a lot has happened. She still has the area over the right first met head in the setting of previous transmetatarsal amputation. Firstly most importantly she underwent revascularization of her occluded right SFA. She underwent directional atherectomy followed by drug-coated balloon angioplasty. She has no named vessel below the knee hopefully the  revascularization of the right SFA will improve collateral flow. It is not felt however that she has any endovascular options. She had follow-up arterial studies noninvasive on 8/9. These showed an ABI of 0.44 at the right PTA. Monophasic waveforms. On the left her great toe ABI was 0.68. Her follow-up arterial Doppler showed a 50 to 74% stenosis in the proximal SFA and a 50 to 74% stenosis in the distal SFA mild to severe atherosclerosis noted throughout the extremity. Areas of shadowing plaque seen, unable to rule out higher grade stenosis she also had a fall apparently  wearing a right foot forefoot off loader that we gave her. She therefore comes in in an ordinary running shoe today. Not certain if this is the fall that ended up in the hospital with bradycardia 9/6 area over the right first metatarsal head in the setting of her previous amputation and severe PAD. She is also a diabetic with known PAD status post attempt at revascularization. She is continued smoker Again the separation of visits in the clinic is somewhat disturbing if we are going to consider her for a total contact cast. She is not wearing anything to offload this stating it causes imbalance especially the forefoot off loader. She basically comes in in bedroom slippers She also had a fall this morning about an hour ago. She has a skin tear on the left dorsal forearm 9/13; areas over the right first metatarsal head in the setting of previous TMA and severe PAD. Again she comes in here in slippers. We have been using [name], [name] ([id number]) [id number] Page 11 of 16 Hydrofera Blue. We use MolecuLight on this which was essentially negative study 9/20; right first metatarsal head in the setting of her previous TMA and severe PAD. Same slipper type shoes. She says she cannot wear a forefoot off loader and for some reason she will not wear surgical shoes. She says she is smoking half a pack per day. 9/30; right first metatarsal head. Absolutely no improvement in fact there is tunneling superiorly very close to bone. She does not offload this properly at all. We use MolecuLight on this 2 weeks ago that did not show any surface bacteria we have been using silver collagen is a dressing She tells me she is down to 3 cigarettes a day I have offered encouragement and a treatment plan 10/6; right first metatarsal head. Exposed bone this week which is not surprising. We have been using silver collagen. She is smoking 5 to 10 cigarettes a day 10/17; she has not been here in 10  days. The bone scraping that I did on 10/6 showed staph lugdunensis, staph epidermidis and Pseudomonas Alcalifenes. I put in for Septra DS 1 p.o. twice daily for 14 days last week would be but we could not reach her to start the antibiotic. Quinolones would have been a good alternative except she has multiple drug interactions. Septra would not cover what ever the Pseudomonas is but I am not really sure of the relevance here. I am going to try her on the Septra for 2 weeks. She has a probing wound on the right TMA that probes to bone. She claims to have just about stop smoking I reviewed her arterial status. She underwent a left fem-tib bypass by Dr. Arbie Cookey in 06/28/2019. Her last angiogram was in July of this year by Dr. Allyson Sabal. This showed an occluded right SFA the popliteal and tibial vessels were also occluded the peroneal vessel filled by collaterals and  filled the PT and DP by communicating collaterals. She was not felt to have any additional and vascular options. Dr. Allyson Sabal noted that she he had previously revascularized her right SFA 3 times. 10/25; she is tolerating the Septra but says it makes her nauseated. I am going to continue this for an additional 2 weeks perhaps for a total of 6 weeks. Her wound does not look too much different. There is on the right first met metatarsal head. There is probing areas around the tissue that is in the middle of the wound. There is no purulence I cannot feel bone. The original source was for a bone scraping. 11/8; right first metatarsal head. This does not probe to bone and there is no purulence. As far as I can tell she is completed the Septra although there were problems with exact length of time and I think compliance. In any case I gave her 6 weeks. I have reviewed her PAD above. 11/15; right first metatarsal head. Again protruding subcutaneous tissue but this does not have any adherence to surrounding skin. Undermining circumferentially. There is no  palpable bone. Not grossly purulent. We have been using Iodoflex to try to get some adherence to clean off the surface but no real improvement. I do not think they actually had enough supplies listening to the patient. She completed the 6 weeks of Septra I gave her, but I am not sure about the compliance with the dates i.e. may have missed days taken 1 a day etc. The patient has a vascular follow-up with Dr. Allyson Sabal this coming Friday i.e. in 3 days. By my read of arterial evaluations I do not think she is felt to be a candidate for any further revascularization. The last angiogram was in July. 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 She continues to smoke now up to 10 cigarettes a day. She is not in any pain. Wound has not improved but is certainly not worsened. I gave her 6 weeks of trimethoprim/sulfamethoxazole which she is completed in some fashion. 11/29 right first metatarsal head previous TMA. Wound actually looks somewhat better today still a lot of callus around the wound on debris on the wound surface. UNFORTUNATELY the patient missed her appointment with Dr. Allyson Sabal and is not rebooked apparently until sometime in February 12/13; right first metatarsal wound actually looks some smaller today. I did not debride this today. She is using Iodoflex.  When she came in last week she had a large abscess on her left anterior lower leg apparently after falling we send her to the ER to have this drained which was done. I cannot see any cultures x-ray was negative. She was apparently given 2 doses of IV antibiotics and discharged on Bactrim DS twice daily which she is still taking. As mentioned I cannot see any cultures. 12/29; culture of the abscess on the left leg I did last time showed Serratia. We gave her antibiotics. I note she was in the ER next day with bleeding which they controlled she is on anticoagulants.She comes in today to clinic and the area on her right plantar foot is  miraculously very hyperkeratotic but healed 02/06/2023 Ms. Yanneli Whetzel is a 62 year old female with a past medical history of uncontrolled type 2 diabetes with TMA to the right foot and fifth ray amputation to left foot. She reports a callus to the right first met head and on the lateral aspect of the left foot. She saw podiatry 3 days ago  and had the callus debrided. There are no open wounds at this time. She follows up with podiatry in 2 weeks. Readmission: 04-19-2023 upon evaluation today patient appears to be doing well all things considered in regard to her wounds. She does have several wounds however on her feet 1 on the left foot centrally along the lateral edge and then a wound on the right lower leg more posterior. On the right foot plantar distal at the transmetatarsal amputation site this is the worst. With that being said she tells me this has been going on for quite some time she has been seen by podiatry they have done some debridements in the past. With that being said based on what we are seeing I believe that the patient's primary issue on the right side is that she is having some issues here with her arterial flow. The ABI on the right is 0.57 on the left is 1.04. With that being said this reading on the right is getting necessitated further evaluation by vascular. She tells me she has been seen by Dr. Gery Pray in the past and he stated there was nothing further to be done for the right leg. With that being said I think that it would be good to have a second opinion with vascular on this. Patient does have a history of diabetes mellitus type 2, peripheral vascular disease, and hypertension. The previous ABIs were performed on January 11, 2023. 04-26-2023 upon evaluation today patient appears to be doing about the same in regard to her wounds. She still has not had the arterial study at this point. I think this is something needs to be done as quickly as possible. Fortunately I do not see  any evidence of active infection locally or systemically which is great news. No fevers, chills, nausea, vomiting, or diarrhea. 7/24; the patient has not been here in about a month and a half. She has open wounds on the plantar right foot second metatarsal head in the setting of a previous TMA, she has a new area on the left lateral foot and perhaps traumatic areas on the right anterior and left lateral lower legs. She has poorly controlled edema. The patient has known severe PAD has had multiple interventions in the right SFA. 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 She had an pressure of 60 at the dorsalis pedis giving her an ABI of 0.57 monophasic waveforms here. On the left her study was a lot better with an ABI 1.04 triphasic waveforms. The ABI might be falsely elevated on the left. I think at her last angiogram she had occluded right SFA popliteal and tibial vessels with the peroneal vessel being the only vessel feeding her foot via collaterals. She comes in this time in basic over-the-counter shoes 06-14-2023 upon evaluation today patient appears to be doing well currently in regard to her wounds all things considered except for the left lateral foot where she has what appears to be a blister this is erythematous as well and concerned about infection. I discussed with her today that I do think we need to see about doing the Vi core culture and subsequently depending on the results of this we will initiate antibiotic treatment as necessary. With that being said right now I think that she is going to need to be aggressive and elevating her legs she has an appointment with vascular coming  up around 3 August is what I believe her appointment date is. Nonetheless once we get clearance from the standpoint of vascular she does have good blood flow and we can subsequently try to see what we do  about getting her edema under much better control and then she really needs some much stronger compression than what we currently are able to do. 06-21-2023 upon evaluation today patient appears to be doing pretty well currently in regard to her wounds most of which seem to be showing signs of improvement. She has been require little bit of debridement in regard to the right plantar foot wound I will do this as carefully as possible as we are still working on establishing that she has sufficient blood flow at this point. This right side on previous testing seem to be somewhat questionable. Fortunately I do not see any signs of active infection locally nor systemically at this time. [name], FETZ ([id number]) [id number] Page 12 of 16 06-28-2023 patient does have an appointment with vascular on 07-11-2023. With that being said right now she has been having some slow progress but still nonetheless good progress at this point. I am actually very pleased with where we stand. I do not see any signs of active infection locally or systemically which is great news. No fevers, chills, nausea, vomiting, or diarrhea. 07-05-2023 upon evaluation today patient appears to be doing well currently in regard to her wound. She has been tolerating the dressing changes. She only has 1 wound left on the plantar aspect of the right foot. With that being said she does have a peg assist offloading shoe she has been using at this time. 8/28; patient with a wound over the plantar right foot metatarsal head. She was seen by Dr. Lenell Antu of vascular surgery. Noted that the right ABI was only 0.57 it was felt that she had progression versus a previous study. She had some stenosis in the SFA a 7599% stenosis in the proximal mid SFA mild stenosis in the distal SFA and mild stenosis in the proximal PTA. On the left she had a patent left femoral to ATA bypass graft with mild stenosis It was recommended that she  undergo angiography for limb salvage on the right. That is being arranged as we speak. Her wound is on the right first metatarsal head she has been using collagen as the primary dressing. She tells me she is making every effort to stop smoking 07-19-2023 upon evaluation today patient appears to be doing okay in regard to her foot ulcer although her arterial procedure/revascularization keeps getting pushed back. She states that they are talking about pushing back and left nail due to the fact that she changed insurances and I do not have a being "updated insurance card number". With that being said I explained to the patient that should be something that she should be able to get on line easily by making an account. She is also called [place] [facility] and they said they were sending it but it would be about a week before described by next Wednesday. She really does not have time to be holding on this she needs to be proceeding with intervention as quickly as possible. 07-26-2023 upon evaluation today patient appears to be doing well currently in regard to her wound. This is actually showing signs of looking about the same I did clear a lot of callus up last week this actually looks better to me from an overall visual standpoint than last week though  it is a little larger because of the callus I had removed. With that being said I feel like she is actually making really good progress here towards complete closure. No fevers, chills, nausea, vomiting, or diarrhea. 08-09-2023 upon evaluation today patient presents for follow-up concerning her ongoing issues with her right plantar foot ulcer. She is going to be having arteriogram on Friday that is just just 2 days and hopefully following this it will actually allow for more aggressive healing in regard to the right plantar foot. Today I am going to perform some debridement to clear away some of the necrotic debris. 08-23-2023 upon evaluation patient's wound  really appears to be doing about the same. She did have her arteriogram with Dr. Lenell Antu and that was on 9-27- 2024. Unfortunately it was noted that the patient had no good options for further revascularization. They plan to reevaluate there in a month but she is stated to be at "high risk for major amputation". With that being said it does appear they were able to do stenting of the right femoral-popliteal artery but unfortunately they were unable to cross the TP trunk and proximal peroneal occlusion. Subsequently this means that there is really not good blood flow into the ankle and foot which means that the patient really does not have a good chance of getting this to heal. 08-30-2023 upon evaluation today patient appears to be doing well currently in regard to her wound. She has been tolerating the dressing changes without complication and in general I do feel that the patient is doing quite well at this time. Unfortunately with the blood flow being so low and really no real vascular options to improve blood flow she really has to try to stay off of this is much as possible. 09-06-2023 upon evaluation today patient appears to be doing well currently in regard to the wound on her plantar foot it does not seem to be any worse is not terribly better but again there is really not much we can do from a blood flow standpoint at this time unfortunately. There does not appear to be any signs of active infection locally or systemically which is good news. She does have a wound on the posterior ankle location and the right leg as well. She also has an area on the left lateral foot. 09-13-2023 upon evaluation today patient appears to be doing decently well all things considered in regard to her wounds. Fortunately I do not see any signs of worsening overall I do believe that the patient is making good headway towards closure which is good news. No fevers, chills, nausea, vomiting, or diarrhea. 11/20; since the  patient was last here she was admitted to hospital from 11/9 through 09/27/2023. She had atrial fibrillation with rapid ventricular response she underwent successful cardioversion to normal sinus rhythm. She has noted to have stage III chronic renal failure, heart failure with preserved ejection fraction. She was discharged home apparently with a furosemide infusion pump although I do not see that on her discharge summary. She is listed as having torsemide to 100 mg/day. Unfortunately in any case she is gaining weight quite substantially per description of the patient. She has significant bilateral lower extremity edema but nonpitting. She has no open wounds on the left leg and right leg. She was not discharged with any home health and I am not really clear anybody is dressing these wounds. She has known PAD She has the original wounds on the right first met head  and right posterior Achilles area. New wounds on the right anteriorly and right posteriorly and on the left laterally and posteriorly. All her wounds are draining clear edema fluid 12/5; since the patient was last here she was admitted to hospital from 11/26 through 11/29. She had acute on chronic diastolic congestive heart failure. She was also in the ER on 12/3 with a small abscess in her right buttock. She was put on doxycycline. She had her legs wrapped when she left the hospital but she took these off 2-3 deep days ago. There is very significant bilateral lower extremity edema which she is tense. She has wounds on her right first metatarsal head right anterior lower extremity right posterior lower extremity x 2 and her left lateral and left posterior lower extremity. The areas on the lower extremity looked mostly like venous wounds although her complicating systemic fluid volume overload is no doubt contributing. We have been using silver alginate and Kerlix Coban. As mentioned she took these off a few days ago The patient saw Dr. Lenell Antu  on 10/10/2023. She is status post right femoral-popliteal angioplasty and stenting Objective Constitutional Sitting or standing Blood Pressure is within target range for patient.. Pulse regular and within target range for patient.[name] Respirations regular, non-labored and within target range.. Temperature is normal and within the target range for the patient.[name] Appears in no distress. Vitals Time Taken: 3:15 AM, Height: 64 in, Weight: 225 lbs, BMI: 38.6, Temperature: 98.4 [name], Pulse: 77 bpm, Respiratory Rate: 18 breaths/min, Blood Pressure: 118/68 mmHg, Capillary Blood Glucose: 190 mg/dl. General Notes: glucose per pt report today Cardiovascular Tense peripheral edema bilaterally.[name] [name], [name] ([id number]) [id number] Page 13 of 16 General Notes: Wound exam; she has scattered superficial wounds over right anterior and more left lateral and posterior lower legs. These do not have any depth. She has poorly controlled edema in both legs. An area on her right first met metatarsal head fortunately does not look infected. Integumentary (Hair, Skin) Wound #23 status is Open. Original cause of wound was Gradually Appeared. The date acquired was: 04/18/2021. The wound has been in treatment 26 weeks. The wound is located on the Right Metatarsal head first. The wound measures 0.5cm length x 0.5cm width x 0.3cm depth; 0.196cm^2 area and 0.059cm^3 volume. There is Fat Layer (Subcutaneous Tissue) exposed. There is no tunneling noted, however, there is undermining starting at 12:00 and ending at 12:00 with a maximum distance of 0.3cm. There is a medium amount of serosanguineous drainage noted. The wound margin is distinct with the outline attached to the wound base. There is medium (34-66%) red, pale granulation within the wound bed. There is a medium (34-66%) amount of necrotic tissue within the wound bed including Adherent Slough. The periwound skin appearance had no abnormalities noted  for color. The periwound skin appearance exhibited: Callus. The periwound skin appearance did not exhibit: Dry/Scaly, Maceration. Periwound temperature was noted as No Abnormality. Wound #29 status is Open. Original cause of wound was Bump. The date acquired was: 08/20/2023. The wound has been in treatment 8 weeks. The wound is located on the Right,Anterior Lower Leg. The wound measures 1.4cm length x 1cm width x 0.1cm depth; 1.1cm^2 area and 0.11cm^3 volume. There is Fat Layer (Subcutaneous Tissue) exposed. There is no tunneling or undermining noted. There is a medium amount of serosanguineous drainage noted. The wound margin is distinct with the outline attached to the wound base. There is small (1-33%) red granulation within the wound bed. There is a large (  67-100%) amount of necrotic tissue within the wound bed including Adherent Slough. The periwound skin appearance exhibited: Hemosiderin Staining. The periwound skin appearance did not exhibit: Callus, Crepitus, Excoriation, Induration, Rash, Scarring, Dry/Scaly, Maceration, Atrophie Blanche, Cyanosis, Ecchymosis, Mottled, Pallor, Rubor, Erythema. Periwound temperature was noted as No Abnormality. Wound #30 status is Open. Original cause of wound was Gradually Appeared. The date acquired was: 09/06/2023. The wound has been in treatment 6 weeks. The wound is located on the Right,Distal,Posterior Lower Leg. The wound measures 0.2cm length x 0.2cm width x 0.1cm depth; 0.031cm^2 area and 0.003cm^3 volume. There is Fat Layer (Subcutaneous Tissue) exposed. There is no tunneling or undermining noted. There is a medium amount of serosanguineous drainage noted. The wound margin is distinct with the outline attached to the wound base. There is large (67-100%) red, pink granulation within the wound bed. There is a small (1-33%) amount of necrotic tissue within the wound bed including Adherent Slough. The periwound skin appearance exhibited:  Hemosiderin Staining. The periwound skin appearance did not exhibit: Callus, Crepitus, Excoriation, Induration, Rash, Scarring, Dry/Scaly, Maceration, Atrophie Blanche, Cyanosis, Ecchymosis, Mottled, Pallor, Rubor, Erythema. Periwound temperature was noted as No Abnormality. Wound #31 status is Open. Original cause of wound was Blister. The date acquired was: 10/04/2023. The wound has been in treatment 2 weeks. The wound is located on the Right,Proximal,Posterior Lower Leg. The wound measures 0.9cm length x 0.6cm width x 0.1cm depth; 0.424cm^2 area and 0.042cm^3 volume. There is Fat Layer (Subcutaneous Tissue) exposed. There is no tunneling or undermining noted. There is a medium amount of serosanguineous drainage noted. The wound margin is flat and intact. There is large (67-100%) red, pink granulation within the wound bed. There is no necrotic tissue within the wound bed. The periwound skin appearance had no abnormalities noted for texture. The periwound skin appearance had no abnormalities noted for moisture. The periwound skin appearance had no abnormalities noted for color. Periwound temperature was noted as No Abnormality. Wound #32 status is Open. Original cause of wound was Blister. The date acquired was: 10/04/2023. The wound has been in treatment 2 weeks. The wound is located on the Left,Lateral Lower Leg. The wound measures 1cm length x 0.7cm width x 0.1cm depth; 0.55cm^2 area and 0.055cm^3 volume. There is Fat Layer (Subcutaneous Tissue) exposed. There is no tunneling or undermining noted. There is a medium amount of serosanguineous drainage noted. There is medium (34-66%) pink granulation within the wound bed. There is a medium (34-66%) amount of necrotic tissue within the wound bed including Adherent Slough. The periwound skin appearance had no abnormalities noted for texture. The periwound skin appearance had no abnormalities noted for moisture. The periwound skin appearance exhibited:  Hemosiderin Staining. Periwound temperature was noted as No Abnormality. Wound #33 status is Open. Original cause of wound was Blister. The date acquired was: 10/04/2023. The wound has been in treatment 2 weeks. The wound is located on the Left,Posterior Lower Leg. The wound measures 0.6cm length x 0.5cm width x 0.1cm depth; 0.236cm^2 area and 0.024cm^3 volume. There is Fat Layer (Subcutaneous Tissue) exposed. There is no tunneling or undermining noted. There is a medium amount of serosanguineous drainage noted. The wound margin is flat and intact. There is medium (34-66%) pink granulation within the wound bed. There is a medium (34-66%) amount of necrotic tissue within the wound bed including Adherent Slough. The periwound skin appearance had no abnormalities noted for texture. The periwound skin appearance had no abnormalities noted for moisture. The periwound skin appearance exhibited: Hemosiderin  Staining. Periwound temperature was noted as No Abnormality. Wound #34 status is Open. Original cause of wound was Gradually Appeared. The date acquired was: 10/19/2023. The wound is located on the Left,Proximal,Lateral Lower Leg. The wound measures 2.2cm length x 2cm width x 0.1cm depth; 3.456cm^2 area and 0.346cm^3 volume. There is Fat Layer (Subcutaneous Tissue) exposed. There is no tunneling or undermining noted. There is a medium amount of serous drainage noted. The wound margin is flat and intact. There is large (67-100%) red granulation within the wound bed. There is a small (1-33%) amount of necrotic tissue within the wound bed including Adherent Slough. The periwound skin appearance had no abnormalities noted for texture. The periwound skin appearance had no abnormalities noted for moisture. The periwound skin appearance had no abnormalities noted for color. Periwound temperature was noted as No Abnormality. The periwound has tenderness on palpation. 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 - Dr Leanord Hawking 11/02/23 at 12:30pm Return appointment in 3 weeks. - Dr Leanord Hawking Nurse Visit: - tuesday or wednesday Anesthetic: (In clinic) Topical Lidocaine 5% applied to wound bed Bathing/ Shower/ Hygiene: May shower with protection but do not get wound dressing(s) wet. Protect dressing(s) with water repellant cover (for example, large plastic bag) or a cast cover [name], [name] ([id number]) [id number] Page 14 of 16 and may then take shower. Edema Control - Orders / Instructions: Elevate legs to the level of the heart or above for 30 minutes daily and/or when sitting for 3-4 times a day throughout the day. Avoid standing for long periods of time. Off-Loading: Open toe surgical shoe to: - with Peg Assist in shoe. Limit walking on foot. Stay off the foot to minimize pressure to bottom of foot. Additional Orders / Instructions: Stop/Decrease Smoking Follow Nutritious Diet Non Wound Condition: Apply the following to affected area as directed: - left foot- apply betadine to affected area cover with ABD pad, secure with kerlix tubigrip size E. Home Health: Wound #23 Right Metatarsal head first: Admit to Home Health for skilled nursing wound care. May utilize formulary equivalent dressing for wound treatment orders unless otherwise specified. [facility] New wound care  orders this week; continue Home Health for wound care. May utilize formulary equivalent dressing for wound treatment orders unless otherwise specified. - Pt. will come to Wound Clinic every 2 weeks-Please perform wound dressing changes 2 x for 1 week (i.e. the week pt. is NOT coming to the Wound Clinic) and the alternate week-[facility]. [facility] change wound dressings x1 that week. Wound dressings- Silver alginate,cover with gauze or ABD and Bilateral Kerlix and Coban to Lower Legs. Other Home Health Orders/Instructions: - [facility] Wound #29 Right,Anterior Lower Leg: Admit to Home Health for skilled nursing wound care. May utilize formulary equivalent dressing for wound treatment orders unless otherwise specified. [facility] New wound care orders this week; continue Home Health for wound care. May utilize formulary equivalent  dressing for wound treatment orders unless otherwise specified. - Pt. will come to Wound Clinic every 2 weeks-Please perform wound dressing changes 2 x for 1 week (i.e. the week pt. is NOT coming to the Wound Clinic) and the alternate week-Pt. will [facility] an [facility] change wound dressings x1 that week. Wound dressings- Silver alginate,cover with gauze or ABD and Bilateral Kerlix and Coban to Lower Legs. Other Home Health Orders/Instructions: - [facility] Wound #30 Right,Distal,Posterior Lower Leg: Admit to Home Health for skilled nursing wound care. May utilize formulary equivalent dressing for wound treatment orders unless otherwise specified. [facility] New wound care orders this week; continue Home Health for wound care. May utilize formulary equivalent dressing for wound treatment orders unless otherwise specified. - Pt. will come to Wound Clinic every 2 weeks-Please perform wound dressing changes 2 x for 1 week (i.e. the week pt. is NOT coming to the Wound Clinic) and the alternate  week-[facility]. [facility] change wound dressings x1 that week. Wound dressings- Silver alginate,cover with gauze or ABD and Bilateral Kerlix and Coban to Lower Legs. Other Home Health Orders/Instructions: - [facility] Wound #31 Right,Proximal,Posterior Lower Leg: Admit to Home Health for skilled nursing wound care. May utilize formulary equivalent dressing for wound treatment orders unless otherwise specified. [facility] New wound care orders this week; continue Home Health for wound care. May utilize formulary equivalent dressing for wound treatment orders unless otherwise specified. - Pt. will come to Wound Clinic every 2 weeks-Please perform wound dressing changes 2 x for 1 week (i.e. the week pt. is NOT coming to the Wound Clinic) and the alternate week-Pt. will [facility] an [facility] change wound dressings x1 that week. Wound dressings- Silver alginate,cover with gauze or ABD and Bilateral Kerlix and Coban to Lower Legs. Other Home Health Orders/Instructions: - [facility] Wound #32 Left,Lateral Lower Leg: Admit to Home Health for skilled nursing wound care. May utilize formulary equivalent dressing for wound treatment orders unless otherwise specified. [facility] New wound care orders this week; continue Home Health for wound care. May utilize formulary equivalent dressing for wound treatment orders unless otherwise specified. - Pt. will come to Wound Clinic every 2 weeks-Please perform wound dressing changes 2 x for 1 week (i.e. the week pt. is NOT coming to the Wound Clinic) and the alternate week-Pt. will [facility] an appointment [facility] change wound dressings x1 that week. Wound dressings- Silver alginate,cover with gauze or ABD and Bilateral Kerlix and Coban to Lower Legs. Other Home Health Orders/Instructions: - [facility] Wound #33 Left,Posterior Lower Leg: Admit to Home Health  for skilled nursing wound care. May utilize formulary equivalent dressing for wound treatment orders unless otherwise specified. [facility] New wound care orders this week; continue Home Health for wound care. May utilize formulary equivalent dressing for wound treatment orders unless otherwise specified. - Pt. will come to Wound Clinic every 2 weeks-Please perform wound dressing changes 2 x for 1 week (i.e. the week pt. is NOT coming to the Wound Clinic) and the alternate week-Pt. will [facility] change wound dressings x1 that week. Wound dressings- Silver alginate,cover with gauze or ABD and Bilateral Kerlix and Coban to Lower Legs. Other Home Health Orders/Instructions: - [facility] WOUND #23: - Metatarsal head first Wound Laterality:  Right Cleanser: Soap and Water 3 x Per Week/30 Days Discharge Instructions: May shower and wash wound with dial antibacterial soap and water prior to dressing change. Cleanser: Wound Cleanser 3 x Per Week/30 Days Discharge Instructions: Cleanse the wound with wound cleanser prior to applying a clean dressing using gauze sponges, not tissue or cotton balls. Peri-Wound Care: Sween Lotion (Moisturizing lotion) 3 x Per Week/30 Days Discharge Instructions: Apply moisturizing lotion as directed Prim Dressing: Maxorb Extra Ag+ Alginate Dressing, 2x2 (in/in) 3 x Per Week/30 Days ary Discharge Instructions: Apply to wound bed as instructed Secondary Dressing: ABD Pad, 8x10 3 x Per Week/30 Days Discharge Instructions: Apply over primary dressing as directed. Secured With: Coban Self-Adherent Wrap 4x5 (in/yd) 3 x Per Week/30 Days Discharge Instructions: Secure with Coban as directed. Secured With: [employer], 4.5x3.1 (in/yd) 3 x Per Week/30 Days Discharge Instructions: Secure with Kerlix as directed. Secured With: 76M Medipore H Soft Cloth Surgical T ape, 4 x 10 (in/yd) 3 x Per Week/30 Days Discharge Instructions:  Secure with tape as directed. WOUND #29: - Lower Leg Wound Laterality: Right, Anterior Cleanser: Soap and Water 3 x Per Week/30 Days Discharge Instructions: May shower and wash wound with dial antibacterial soap and water prior to dressing change. Cleanser: Wound Cleanser 3 x Per Week/30 Days Discharge Instructions: Cleanse the wound with wound cleanser prior to applying a clean dressing using gauze sponges, not tissue or cotton balls. Peri-Wound Care: Sween Lotion (Moisturizing lotion) 3 x Per Week/30 Days Discharge Instructions: Apply moisturizing lotion as directed Prim Dressing: Maxorb Extra Ag+ Alginate Dressing, 2x2 (in/in) 3 x Per Week/30 Days ary Discharge Instructions: Apply to wound bed as instructed Secondary Dressing: ABD Pad, 8x10 3 x Per Week/30 Days Discharge Instructions: Apply over primary dressing as directed. Secured With: Coban Self-Adherent Wrap 4x5 (in/yd) 3 x Per Week/30 Days Discharge Instructions: Secure with Coban as directed. Secured With: [employer], 4.5x3.1 (in/yd) 3 x Per Week/30 Days Discharge Instructions: Secure with Kerlix as directed. [name], [name] ([id number]) [id number] Page 15 of 16 Secured With: 76M Medipore H Soft Cloth Surgical T ape, 4 x 10 (in/yd) 3 x Per Week/30 Days Discharge Instructions: Secure with tape as directed. WOUND #30: - Lower Leg Wound Laterality: Right, Posterior, Distal Cleanser: Soap and Water 3 x Per Week/30 Days Discharge Instructions: May shower and wash wound with dial antibacterial soap and water prior to dressing change. Cleanser: Wound Cleanser 3 x Per Week/30 Days Discharge Instructions: Cleanse the wound with wound cleanser prior to applying a clean dressing using gauze sponges, not tissue or cotton balls. Peri-Wound Care: Sween Lotion (Moisturizing lotion) 3 x Per Week/30 Days Discharge Instructions: Apply moisturizing lotion as directed Prim Dressing: Maxorb Extra Ag+ Alginate  Dressing, 2x2 (in/in) 3 x Per Week/30 Days ary Discharge Instructions: Apply to wound bed as instructed Secondary Dressing: ABD Pad, 8x10 3 x Per Week/30 Days Discharge Instructions: Apply over primary dressing as directed. Secured With: Coban Self-Adherent Wrap 4x5 (in/yd) 3 x Per Week/30 Days Discharge Instructions: Secure with Coban as directed. Secured With: [employer], 4.5x3.1 (in/yd) 3 x Per Week/30 Days Discharge Instructions: Secure with Kerlix as directed. Secured With: 76M Medipore H Soft Cloth Surgical T ape, 4 x 10 (in/yd) 3 x Per Week/30 Days Discharge Instructions: Secure with tape as directed. WOUND #31: - Lower Leg Wound Laterality: Right, Posterior, Proximal Cleanser: Soap and Water 3 x Per Week/30 Days Discharge Instructions: May shower and wash wound with dial antibacterial soap and water  prior to dressing change. Cleanser: Wound Cleanser 3 x Per Week/30 Days Discharge Instructions: Cleanse the wound with wound cleanser prior to applying a clean dressing using gauze sponges, not tissue or cotton balls. Peri-Wound Care: Sween Lotion (Moisturizing lotion) 3 x Per Week/30 Days Discharge Instructions: Apply moisturizing lotion as directed Prim Dressing: Maxorb Extra Ag+ Alginate Dressing, 2x2 (in/in) 3 x Per Week/30 Days ary Discharge Instructions: Apply to wound bed as instructed Secondary Dressing: ABD Pad, 8x10 3 x Per Week/30 Days Discharge Instructions: Apply over primary dressing as directed. Secured With: Coban Self-Adherent Wrap 4x5 (in/yd) 3 x Per Week/30 Days Discharge Instructions: Secure with Coban as directed. Secured With: [employer], 4.5x3.1 (in/yd) 3 x Per Week/30 Days Discharge Instructions: Secure with Kerlix as directed. Secured With: 72M Medipore H Soft Cloth Surgical T ape, 4 x 10 (in/yd) 3 x Per Week/30 Days Discharge Instructions: Secure with tape as directed. WOUND #32: - Lower Leg Wound Laterality: Left, Lateral Cleanser: Soap and  Water 3 x Per Week/30 Days Discharge Instructions: May shower and wash wound with dial antibacterial soap and water prior to dressing change. Cleanser: Wound Cleanser 3 x Per Week/30 Days Discharge Instructions: Cleanse the wound with wound cleanser prior to applying a clean dressing using gauze sponges, not tissue or cotton balls. Peri-Wound Care: Sween Lotion (Moisturizing lotion) 3 x Per Week/30 Days Discharge Instructions: Apply moisturizing lotion as directed Prim Dressing: Maxorb Extra Ag+ Alginate Dressing, 2x2 (in/in) 3 x Per Week/30 Days ary Discharge Instructions: Apply to wound bed as instructed Secondary Dressing: ABD Pad, 8x10 3 x Per Week/30 Days Discharge Instructions: Apply over primary dressing as directed. Secured With: Coban Self-Adherent Wrap 4x5 (in/yd) 3 x Per Week/30 Days Discharge Instructions: Secure with Coban as directed. Secured With: [employer], 4.5x3.1 (in/yd) 3 x Per Week/30 Days Discharge Instructions: Secure with Kerlix as directed. Secured With: 72M Medipore H Soft Cloth Surgical T ape, 4 x 10 (in/yd) 3 x Per Week/30 Days Discharge Instructions: Secure with tape as directed. WOUND #33: - Lower Leg Wound Laterality: Left, Posterior Cleanser: Soap and Water 3 x Per Week/30 Days Discharge Instructions: May shower and wash wound with dial antibacterial soap and water prior to dressing change. Cleanser: Wound Cleanser 3 x Per Week/30 Days Discharge Instructions: Cleanse the wound with wound cleanser prior to applying a clean dressing using gauze sponges, not tissue or cotton balls. Peri-Wound Care: Sween Lotion (Moisturizing lotion) 3 x Per Week/30 Days Discharge Instructions: Apply moisturizing lotion as directed Prim Dressing: Maxorb Extra Ag+ Alginate Dressing, 2x2 (in/in) 3 x Per Week/30 Days ary Discharge Instructions: Apply to wound bed as instructed Secondary Dressing: ABD Pad, 8x10 3 x Per Week/30 Days Discharge Instructions: Apply over primary  dressing as directed. Secured With: Coban Self-Adherent Wrap 4x5 (in/yd) 3 x Per Week/30 Days Discharge Instructions: Secure with Coban as directed. Secured With: [employer], 4.5x3.1 (in/yd) 3 x Per Week/30 Days Discharge Instructions: Secure with Kerlix as directed. Secured With: 72M Medipore H Soft Cloth Surgical T ape, 4 x 10 (in/yd) 3 x Per Week/30 Days Discharge Instructions: Secure with tape as directed. WOUND #34: - Lower Leg Wound Laterality: Left, Lateral, Proximal Cleanser: Soap and Water 3 x Per Week/30 Days Discharge Instructions: May shower and wash wound with dial antibacterial soap and water prior to dressing change. Cleanser: Wound Cleanser 3 x Per Week/30 Days Discharge Instructions: Cleanse the wound with wound cleanser prior to applying a clean dressing using gauze sponges, not tissue or cotton  balls. Peri-Wound Care: Sween Lotion (Moisturizing lotion) 3 x Per Week/30 Days Discharge Instructions: Apply moisturizing lotion as directed Prim Dressing: Maxorb Extra Ag+ Alginate Dressing, 2x2 (in/in) 3 x Per Week/30 Days ary Discharge Instructions: Apply to wound bed as instructed Secondary Dressing: ABD Pad, 8x10 3 x Per Week/30 Days Discharge Instructions: Apply over primary dressing as directed. Secured With: Coban Self-Adherent Wrap 4x5 (in/yd) 3 x Per Week/30 Days Discharge Instructions: Secure with Coban as directed. Secured With: [employer], 4.5x3.1 (in/yd) 3 x Per Week/30 Days Discharge Instructions: Secure with Kerlix as directed. Secured With: 81M Medipore H Soft Cloth Surgical T ape, 4 x 10 (in/yd) 3 x Per Week/30 Days Discharge Instructions: Secure with tape as directed. 1. Her legs are not in a healable state. There is far too much edema. She has significant PAD and we have only been wrapping her with kerlix Coban. This is not going to be sufficient to heal these wounds. VIVI, FASSLER ([id number]) [id number].pdf Page 16 of  16 2 I think this is largely a palliative state in terms of wound care. She has home health changing this twice a week. The patient as such I think we should monitor this from a distance. She has a follow-up with [place] in 2 weeks Electronic Signature(s) Signed: 10/19/2023 5:41:20 PM By: Baltazar Najjar MD Entered By: Baltazar Najjar on 10/19/2023 14:13:53 -------------------------------------------------------------------------------- SuperBill Details Patient Name: Date of Service: [name], [name] 10/19/2023 Medical Record Number: [id number] Patient Account Number: [id number] Date of Birth/Sex: Treating RN: 03/28/1961 (61 y.o. [name] Primary Care Provider: [name] Other Clinician: Referring Provider: Treating Provider/Extender: Josephine Cables Weeks in Treatment: 26 Diagnosis Coding ICD-10 Codes Code Description 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 L97.522 Non-pressure chronic ulcer of other part of left foot with fat layer exposed E11.622 Type 2 diabetes mellitus with other skin ulcer L97.812 Non-pressure chronic ulcer of other part of right lower leg with fat layer exposed I73.89 Other specified peripheral vascular diseases I87.331 Chronic venous hypertension (idiopathic) with ulcer and inflammation of right lower extremity L97.828 Non-pressure chronic ulcer of other part of left lower leg with other specified severity Facility Procedures : CPT4 Code: 44010272 Description: 53664 - WOUND CARE VISIT-LEV 5 EST PT Modifier: Quantity: 1 Physician Procedures : CPT4 Code Description Modifier [id number] - WC PHYS LEVEL 3 - EST PT ICD-10 Diagnosis Description 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 L97.812  Non-pressure chronic ulcer of other part of right lower  leg with fat layer exposed L97.828 Non-pressure chronic ulcer of other part of left lower leg with other specified severity Quantity: 1 Electronic Signature(s) Signed: 10/19/2023 5:41:20 PM By: Baltazar Najjar MD Previous Signature: 10/19/2023 5:01:11 PM Version By: Karie Schwalbe RN Entered By: Baltazar Najjar on 10/19/2023 14:14:18

## 2023-10-20 NOTE — Telephone Encounter (Signed)
Form has been faxed back. Per Dr.Newlin patient is no longer under her care.

## 2023-10-20 NOTE — Telephone Encounter (Signed)
Frankie from [facility] called in about form sent in for order for wound care, says the order was sent in 11/19 and hasn't heard anything back. Fx is [phone]

## 2023-10-23 ENCOUNTER — Telehealth (HOSPITAL_COMMUNITY): Payer: Self-pay

## 2023-10-23 NOTE — Telephone Encounter (Signed)
I attempted to reach out to pt numerous times today via call and texts and even reached out to her sister without any responses.  Her cardiomems readings still elevated.  Will continue to try to reach out to her.    Kerry Hough, EMT-Paramedic  [phone] 10/23/2023

## 2023-10-26 ENCOUNTER — Encounter (HOSPITAL_BASED_OUTPATIENT_CLINIC_OR_DEPARTMENT_OTHER): Payer: 59 | Admitting: Internal Medicine

## 2023-10-26 ENCOUNTER — Other Ambulatory Visit (HOSPITAL_COMMUNITY): Payer: Self-pay

## 2023-10-26 DIAGNOSIS — E11621 Type 2 diabetes mellitus with foot ulcer: Secondary | ICD-10-CM | POA: Diagnosis not present

## 2023-10-26 NOTE — Progress Notes (Signed)
Paramedicine Encounter    Patient ID: [name], female    DOB: 08-Apr-1961, 62 y.o.   MRN: [id number]   Complaints-leg pain   Edema-legs wrapped   Compliance with meds-yes  Pill box filled-yes  If so, by whom-paramedic   Refills needed-metoprolol   Pt reports she is doing better. She denies increased sob, no dizziness, no c/p.  She reports leg pain. Finishing out her doxy on sat pm.    Meds verified and pill box refilled. She needs metoprolol in wed and thur next week. Asked pharmacy to fill and deliver.  Her cardiomems was rising earlier this week, I had tried to reach pt and called multiple times to see her sooner this week but she didn't ever answer and neither did her sister. Readings more recent were decreasing but then looks like she is trying to go up again. Her legs are wrapped.   At last visit she was instructed to take with her metolazone but the potassium is not on her med list. Will send triage message ref this.   wheezing to lungs about all over. She denies increased sob, she did use inhaler today.  Denies c/p or dizziness or increased sob. Sister reports she has been keeping fluids down and watching her sodium intake.   BP (!) 144/64   Pulse 70   Resp 19   Wt 215 lb (97.5 kg)   LMP  (LMP Unknown)   SpO2 95%   BMI 39.32 kg/m  Weight yesterday-214 Last visit weight-228 in clinic   Patient Care Team: Runell Gess, MD as PCP - Cardiology (Cardiology) Lanier Prude, MD as PCP - Electrophysiology (Cardiology) Runell Gess, MD as Consulting Physician (Cardiology) Nyoka Cowden, MD as Consulting Physician (Pulmonary Disease) System, Provider Not In  Patient Active Problem List   Diagnosis Date Noted   Heart failure ([name]) 10/11/2023   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) and diastolic (congestive) heart failure ([name]) 10/10/2023   Pre-ulcerative calluses 04/25/2022   COPD (chronic obstructive pulmonary disease) ([name])  05/05/2021   OSA (obstructive sleep apnea) 05/05/2021   Stage 3b chronic kidney disease ([name]) 05/05/2021   Pressure injury of skin 05/04/2021   Diabetic nephropathy ([name]) 01/02/2021   Low back pain 06/01/2020   Hyperlipidemia 04/03/2020   PAD (peripheral artery disease) ([name]) 12/26/2019   NICM (nonischemic cardiomyopathy) ([name]) 06/20/2019   Non-healing ulcer ([name]) 06/20/2019   Coagulation disorder ([name]) 08/09/2017   Depression 07/21/2017   At risk for adverse drug reaction 06/20/2017   Peripheral neuropathy 06/20/2017   S/P transmetatarsal amputation of foot, right ([name]) 06/05/2017   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) 05/19/2017   Obesity, class 2 02/24/2016   Encounter for therapeutic drug monitoring 02/10/2016   Symptomatic bradycardia 01/12/2016   Essential hypertension 12/22/2015   Elevated troponin    Acute on chronic diastolic CHF (congestive heart failure) ([name])    Anemia of chronic disease 11/08/2015   Hypokalemia 11/08/2015   Tobacco abuse 10/23/2015   Coronary artery disease    DOE (dyspnea on exertion) 04/29/2015   PAF (paroxysmal atrial fibrillation) ([name]) 01/16/2015   Carotid arterial disease ([name]) 01/16/2015   Insomnia 02/03/2014   S/P peripheral artery angioplasty - TurboHawk atherectomy; R SFA 09/11/2013    Class: Acute   Leg pain, bilateral 08/19/2013   Hypothyroidism 07/31/2013   History of cocaine abuse ([name]) 06/13/2013   Long term current use of anticoagulant therapy 05/20/2013  Alcohol abuse    Narcotic abuse ([name])    Marijuana abuse    Alcoholic cirrhosis ([name])    Type 2 diabetes mellitus with hyperlipidemia ([name])     Current Outpatient Medications:    acetaminophen (TYLENOL) 500 MG tablet, Take 1,000 mg by mouth every 6 (six) hours as needed for headache (pain)., Disp: , Rfl:    albuterol (VENTOLIN HFA) 108 (90 Base) MCG/ACT inhaler, USE 2 PUFFS BY MOUTH EVERY FOUR HOURS, AS NEEDED,  FOR COUGHING/WHEEZING, Disp: 8.5 g, Rfl: 0   apixaban (ELIQUIS) 5 MG TABS tablet, Take 1 tablet (5 mg total) by mouth 2 (two) times daily., Disp: 60 tablet, Rfl: 6   budesonide-formoterol (SYMBICORT) 160-4.5 MCG/ACT inhaler, Inhale 2 puffs into the lungs 2 (two) times daily., Disp: 1 each, Rfl: 2   buPROPion ER (WELLBUTRIN SR) 100 MG 12 hr tablet, Take 1 tablet (100 mg total) by mouth daily., Disp: 30 tablet, Rfl: 6   doxycycline (VIBRAMYCIN) 100 MG capsule, Take 1 capsule (100 mg total) by mouth 2 (two) times daily., Disp: 20 capsule, Rfl: 0   fluticasone (FLONASE) 50 MCG/ACT nasal spray, Place 1 spray into both nostrils as needed for allergies or rhinitis., Disp: , Rfl:    insulin lispro (HUMALOG) 100 UNIT/ML KwikPen, Inject 10 Units into the skin 3 (three) times daily with meals., Disp: 15 mL, Rfl: 0   LANTUS SOLOSTAR 100 UNIT/ML Solostar Pen, Inject 37 Units into the skin daily., Disp: 15 mL, Rfl: 0   levothyroxine (SYNTHROID) 50 MCG tablet, TAKE 1 TABLET (50 MCG TOTAL) BY MOUTH DAILY BEFORE BREAKFAST (AM), Disp: 30 tablet, Rfl: 0   losartan (COZAAR) 25 MG tablet, Take 1 tablet (25 mg total) by mouth daily., Disp: 30 tablet, Rfl: 0   metolazone (ZAROXOLYN) 2.5 MG tablet, Take 1 tablet (2.5 mg total) by mouth once a week. Every Monday, Disp: 15 tablet, Rfl: 3   metoprolol succinate (TOPROL-XL) 25 MG 24 hr tablet, Take 1 tablet (25 mg total) by mouth daily., Disp: 90 tablet, Rfl: 3   pantoprazole (PROTONIX) 40 MG tablet, TAKE 1 TABLET (40 MG TOTAL) BY MOUTH DAILY(AM), Disp: 30 tablet, Rfl: 0   rosuvastatin (CRESTOR) 40 MG tablet, TAKE 1 TABLET (40 MG TOTAL) BY MOUTH DAILY (BEDTIME), Disp: 90 tablet, Rfl: 0   spironolactone (ALDACTONE) 25 MG tablet, Take 1 tablet (25 mg total) by mouth daily., Disp: 90 tablet, Rfl: 3   torsemide (DEMADEX) 100 MG tablet, Take 1 tablet (100 mg total) by mouth 2 (two) times daily., Disp: 180 tablet, Rfl: 3   triamcinolone ointment (KENALOG) 0.1 %, Apply topically 2  (two) times daily., Disp: 454 g, Rfl: 0   VITAMIN E PO, Take 1 tablet by mouth daily., Disp: , Rfl:    ACCU-CHEK GUIDE test strip, USE TO CHECK BLOOD SUGAR FOUR TIMES DAILY, Disp: , Rfl:    Cholecalciferol (VITAMIN D3 PO), Take 1 capsule by mouth every morning., Disp: , Rfl:    colchicine 0.6 MG tablet, Take 1.2 mg by mouth See admin instructions. Take 1.2 mg for flair up and an hour later take 0.6 mg if needed (Patient not taking: Reported on 10/26/2023), Disp: , Rfl:    EASY COMFORT PEN NEEDLES 31G X 5 MM MISC, USE FOUR TIMES A DAY FOR INSULIN ADMINISTRATION, Disp: 100 each, Rfl: 1   Furosemide (FUROSCIX [place]), Inject into the skin as directed. As needed, Disp: , Rfl:    polyethylene glycol (MIRALAX / GLYCOLAX) 17 g packet, Take 17 g by  mouth daily as needed for moderate constipation., Disp: 14 each, Rfl: 0 Allergies  Allergen Reactions   Gabapentin Nausea And Vomiting and Other (See Comments)    POSSIBLE SHAKING   Lyrica [Pregabalin] Other (See Comments)    Shaking       Social History   Socioeconomic History   Marital status: Single    Spouse name: Not on file   Number of children: 1   Years of education: 12   Highest education level: 12th grade  Occupational History   Occupation: disabled  Tobacco Use   Smoking status: Every Day    Current packs/day: 1.00    Average packs/day: 1 pack/day for 44.0 years (44.0 ttl pk-yrs)    Types: E-cigarettes, Cigarettes   Smokeless tobacco: Former    Types: Snuff  Vaping Use   Vaping status: Former   Devices: 11/26/2018 "stopped months ago"  Substance and Sexual Activity   Alcohol use: Not Currently    Comment: occ   Drug use: Not Currently    Types: "Crack" cocaine, Marijuana, Oxycodone   Sexual activity: Not Currently  Other Topics Concern   Not on file  Social History Narrative   ** Merged History Encounter **       Lives in [place], in motel with sister.  They are looking to move but don't have a place to go yet.     Social  Drivers of Corporate investment banker Strain: Low Risk  (02/09/2023)   Overall Financial Resource Strain (CARDIA)    Difficulty of Paying Living Expenses: Not hard at all  Food Insecurity: No Food Insecurity (10/10/2023)   Hunger Vital Sign    Worried About Running Out of Food in the Last Year: Never true    Ran Out of Food in the Last Year: Never true  Transportation Needs: No Transportation Needs (10/10/2023)   PRAPARE - Administrator, Civil Service (Medical): No    Lack of Transportation (Non-Medical): No  Physical Activity: Inactive (02/09/2023)   Exercise Vital Sign    Days of Exercise per Week: 0 days    Minutes of Exercise per Session: 0 min  Stress: No Stress Concern Present (02/09/2023)   [employer] of Occupational Health - Occupational Stress Questionnaire    Feeling of Stress : Not at all  Social Connections: Unknown (08/21/2023)   [facility]   Social Network    Social Network: Not on file  Intimate Partner Violence: Not At Risk (10/10/2023)   Humiliation, Afraid, Rape, and Kick questionnaire    Fear of Current or Ex-Partner: No    Emotionally Abused: No    Physically Abused: No    Sexually Abused: No    Physical Exam      Future Appointments  Date Time Provider Department Center  10/31/2023 12:00 PM [facility] PA/NP [facility] None  11/02/2023  9:10 AM Anders Simmonds, PA-C [facility] None  11/20/2023  1:15 PM Maxwell Caul, MD [facility]  02/12/2024  4:00 PM [facility] ANNUAL WELLNESS VISIT [facility] None  04/09/2024  3:00 PM [facility] VASC 5 [facility]  04/09/2024  4:00 PM Leonie Douglas, MD [facility]       Kerry Hough, Paramedic [phone] [facility] Paramedic  10/26/23

## 2023-10-27 ENCOUNTER — Telehealth (HOSPITAL_COMMUNITY): Payer: Self-pay | Admitting: Cardiology

## 2023-10-27 MED ORDER — POTASSIUM CHLORIDE CRYS ER 20 MEQ PO TBCR: 40.0000 meq | EXTENDED_RELEASE_TABLET | Freq: Every day | ORAL | Status: DC

## 2023-10-27 NOTE — Telephone Encounter (Signed)
-----   Message from [facility] [name] L sent at 10/26/2023  5:02 PM EST ----- Regarding: potassium Hey,   Per last clinic visit she was told to take of K with her metolazone but its not in her med list.  Can you confirm?  She seem brittiany last.   Michel Santee, EMT-Paramedic  [phone] 10/26/2023

## 2023-10-27 NOTE — Progress Notes (Addendum)
REAVER, RAYMER ([id number]) [id number] Page 1 of 17 Visit Report for 10/26/2023 HPI Details Patient Name: Date of Service: [name] 10/26/2023 12:30 PM Medical Record Number: [id number] Patient Account Number: [id number] Date of Birth/Sex: Treating RN: 1961/05/27 (62 y.o. F) Primary Care Provider: [name] Other Clinician: Referring Provider: Treating Provider/Extender: Josephine Cables Weeks in Treatment: 27 History of Present Illness HPI Description: This 62 year old patient who has a very long significant history of diabetes mellitus, previous alcohol and nicotine abuse, chronic data disease, COPD, diabetes mellitus, height hypertension, critical lower limb ischemia with several wounds being managed at the wound Center at [facility] for over a year. She was recently in the ER at [facility] and was referred to our center. He has had a long history of critical limb ischemia and over a period of time has had balloon angioplasties in March 2016, endarterectomy of the left carotid, lower extremity angiogram and treatment by Dr. Nanetta Batty, several cardiac catheterizations. Most recently she had an x-ray of her left foot while in the ER which showed no acute abnormality. during this ER visit she was started on ciprofloxacin and asked to continue with the wound care physicians at [facility]. Her last ABI done in June 2017 showed the right side to be 0.28 in the left side to be 0.48. Her right toe brachial index was 0.17 on the right and 0.27 on the left. her last hemoglobin A1c was 11.1% She continues to smoke about a pack of cigarettes a day. 03/28/2017 -- -- right foot x-ray -- IMPRESSION:Areas of soft tissue swelling. Mild subluxation second PIP joint. No frank dislocation or fracture. No erosive change or bony destruction. No soft tissue ulceration or radiopaque foreign body evident by radiography. There is plantar  fascia calcification with a nearby inferior calcaneal spur. There are foci of arterial vascular calcification. 04/04/2017 -- the patient continues to be noncompliant and continues to complain of a lot of pain and has not done anything about quitting smoking Readmission: 06/26/18 on evaluation today patient presents for reevaluation she has not been seen in our office since May 2018. Since she was last seen here in our office she has undergone testing at Dr. Durenda Age office where it was revealed that she had an ABI of 0.68 with her previous ABI being 0.64 and a left ABI of 0.38 with previous ABI being 0.34 this study which was performed on 04/05/18 was pretty much equivalent to the study performed on 11/22/17. The findings in the end revealed on the right would appear to be moderate right lower extremity arterial disease on the left Dr. Allyson Sabal states moderate in the report but unfortunately this appears to be much more severe at 0.38 compared to the right. Subsequently the patient was scheduled to have angiography with Dr. Allyson Sabal on 04/12/18. This was however canceled due to the fact that the patient was found to have chronic kidney disease stage III and it was to the point that he did not feel that it will be safe to pursue angiography at that point. She has not been on any antibiotics recently. At this point Dr. Allyson Sabal has not rescheduled anything as far as the injured [place] according to the patient he is somewhat reluctant to Karla so. Nonetheless with her diminished blood flow this is gonna make it somewhat difficult for her to heal. 07/05/18; this is a patient I have not seen previously. She has very significant PAD as noted above. She  apparently has had revascularization efforts by Dr. Allyson Sabal on the right on 3 occasions to the patient. She was supposed to have an attempted angiography on the left however this was canceled apparently because of stage III chronic renal failure. I will need to research  all of this. She complains of significant pain in the wound and has claudication enough that when she walks to the end of the driveway she has to stop. She is been using silver alginate to the wounds on her legs and Iodoflex to the area on her left second toe. 07/13/18 on evaluation today patient's wounds actually appear to be doing about the same. She has an appointment she tells me within the next month that is September 2019 with Dr. Allyson Sabal to discuss options to see if there's anything else that you can recommend or Karla for her. Nonetheless obviously what we're trying to Karla is Karla what we can to save her leg and in turn prevent any additional worsening or damage. None in the meantime we been mainly trying to manage her ulcers as best we can. 07/27/18; some improvement in the multiplicity of wounds on her left lower extremity and foot. She's been using silver alginate. I was unable to determine that she actually has an appointment with Dr. Allyson Sabal. We are checking into this The patient's wound includes Left lateral foot, left plantar heel, her left anterior calf wound looks close to me, left fourth toe is very close to closed and the left medial calf is perhaps the largest open area READMISSION 02/19/2021 This is a now 62 year old woman with type 2 diabetes and continued cigarette smoking. She has known PAD. She was last in this clinic in 2019 at that point with wounds on her left foot. She left in a nonhealed state. On 06/28/2019 I see she had a left femoropopliteal by vein and vascular with an amputation of the fifth ray. These managed to heal over. Her last arterial studies were in February 2021. This showed an absent waveform at the posterior tibial artery on the right a dampened monophasic dorsalis pedis on the right of 0.44. On the left again the PTA TA was absent her dorsalis pedis was 0.99 triphasic and her great toe pressure was 0.65. This would have been after her revascularization. Once again  she tells me that Dr. Allyson Sabal has done revascularization on the right leg on 3 different occasions. She has a right transmetatarsal amputation apparently done by Dr. Lajoyce Corners remotely but I Karla not see a note for these right lower extremity revascularization but I may not of looked back far enough. In any case she says that the she has a wound on the plantar aspect of the right transmetatarsal amputation site there is been there for several months. More recently she fell and had a wound on her right medial lower leg she had 5 sutures placed in the ER and then subsequently she has developed an area on the medial lower leg which was a blister that opened up. Past medical history includes type 2 diabetes, PAD, chronic renal failure, congestive heart failure, right transmetatarsal amputation, left fifth ray amputation, continued tobacco abuse, atrial fibrillation and cirrhosis. We did not attempt an ABI on the right leg today because of pain 02/26/2021; patient we admitted to the clinic last week. She has what looks to be 2 areas on her right medial and right anterior lower leg that look more like [name], [name] ([id number]) [id number] Page 2 of 17 venous wounds but  she has 1 on the first met head at the base of her right transmet. She has known severe PAD. She complains of a lot of pain although some of this may be neuropathic. Is difficult to exclude a component of claudication. Use silver alginate on the wounds on her legs and Iodoflex on the area on the first met head. She has an appointment with Dr. Allyson Sabal on 5/25 although I will text him and discussed the situation. She is have poorly controlled diabetic. She has has stage IIIb chronic renal failure 4/22; patient presents for 1 week follow-up. The 2 areas on her right medial and right anterior lower leg appear well-healing. She has been using silver alginate to this area without issues. She has a first met head ulcer and it is  unclear how long this has been there as it was discovered in the ED earlier this month when she was being evaluated for another issue. Iodoflex has been used at this area. 4/29; patient presents for 1 week follow-up. She has been using silver alginate to the leg wounds and Iodoflex to the plantar foot wound. She has had this wrapped with Coban and Kerlix. She has no concerns or complaints today. 5/6; this is a difficult wound on the right plantar foot transmit site. We are not making a lot of progress. She had 2 more venous looking wounds on the right medial and right anterior lower leg 1 of which is healed. Her appointment with Dr. Allyson Sabal is on 5/25 5/16; she did not tolerate the offloading shoe we gave her in fact she had a fall with an abrasion on her right forearm she is now back in the modified small shoe which does not offload her foot properly. Her appoint with Dr. Allyson Sabal is still on 5/25 we have been using Iodoflex. The area on the right anterior lower leg is healed 7/18; patient presents for follow-up however has not been seen in 2 months. She was last seen at the end of May. She had a fall at the end of June and was hospitalized. She has been unable to follow-up since then. For the wound she has only been keeping it covered with gauze. She is scheduled for an aortogram with lower extremity runoff at the end of July. 7/29; patient presents for 2-week follow-up. She has home health and they have been changing her dressings. She denies any issues and has no complaints today. She states she had a procedure where they opened up one of her vessels in her legs. She denies signs of infection. 8/5; patient presents for follow-up. She was recently in the hospital for bradycardia. She was noted to have cellulitis to the left lower extremity and Unna boot was placed due to blisters and increased swelling. She reports improvement to her left leg since being in the hospital and stability check her right  plantar foot wound. She denies signs of infection. 8/23; since I last saw this patient a lot has happened. She still has the area over the right first met head in the setting of previous transmetatarsal amputation. Firstly most importantly she underwent revascularization of her occluded right SFA. She underwent directional atherectomy followed by drug-coated balloon angioplasty. She has no named vessel below the knee hopefully the revascularization of the right SFA will improve collateral flow. It is not felt however that she has any endovascular options. She had follow-up arterial studies noninvasive on 8/9. These showed an ABI of 0.44 at the right PTA. Monophasic waveforms. On  the left her great toe ABI was 0.68. Her follow-up arterial Doppler showed a 50 to 74% stenosis in the proximal SFA and a 50 to 74% stenosis in the distal SFA mild to severe atherosclerosis noted throughout the extremity. Areas of shadowing plaque seen, unable to rule out higher grade stenosis she also had a fall apparently wearing a right foot forefoot off loader that we gave her. She therefore comes in in an ordinary running shoe today. Not certain if this is the fall that ended up in the hospital with bradycardia 9/6 area over the right first metatarsal head in the setting of her previous amputation and severe PAD. She is also a diabetic with known PAD status post attempt at revascularization. She is continued smoker Again the separation of visits in the clinic is somewhat disturbing if we are going to consider her for a total contact cast. She is not wearing anything to offload this stating it causes imbalance especially the forefoot off loader. She basically comes in in bedroom slippers She also had a fall this morning about an hour ago. She has a skin tear on the left dorsal forearm 9/13; areas over the right first metatarsal head in the setting of previous TMA and severe PAD. Again she comes in here in slippers. We  have been using Hydrofera Blue. We use MolecuLight on this which was essentially negative study 9/20; right first metatarsal head in the setting of her previous TMA and severe PAD. Same slipper type shoes. She says she cannot wear a forefoot off loader and for some reason she will not wear surgical shoes. She says she is smoking half a pack per day. 9/30; right first metatarsal head. Absolutely no improvement in fact there is tunneling superiorly very close to bone. She does not offload this properly at all. We use MolecuLight on this 2 weeks ago that did not show any surface bacteria we have been using silver collagen is a dressing She tells me she is down to 3 cigarettes a day I have offered encouragement and a treatment plan 10/6; right first metatarsal head. Exposed bone this week which is not surprising. We have been using silver collagen. She is smoking 5 to 10 cigarettes a day 10/17; she has not been here in 10 days. The bone scraping that I did on 10/6 showed staph lugdunensis, staph epidermidis and Pseudomonas Alcalifenes. I put in for Septra DS 1 p.o. twice daily for 14 days last week would be but we could not reach her to start the antibiotic. Quinolones would have been a good alternative except she has multiple drug interactions. Septra would not cover what ever the Pseudomonas is but I am not really sure of the relevance here. I am going to try her on the Septra for 2 weeks. She has a probing wound on the right TMA that probes to bone. She claims to have just about stop smoking I reviewed her arterial status. She underwent a left fem-tib bypass by Dr. Arbie Cookey in 06/28/2019. Her last angiogram was in July of this year by Dr. Allyson Sabal. This showed an occluded right SFA the popliteal and tibial vessels were also occluded the peroneal vessel filled by collaterals and filled the PT and DP by communicating collaterals. She was not felt to have any additional and vascular options. Dr. Allyson Sabal noted that  she he had previously revascularized her right SFA 3 times. 10/25; she is tolerating the Septra but says it makes her nauseated. I am going to continue  this for an additional 2 weeks perhaps for a total of 6 weeks. Her wound does not look too much different. There is on the right first met metatarsal head. There is probing areas around the tissue that is in the middle of the wound. There is no purulence I cannot feel bone. The original source was for a bone scraping. 11/8; right first metatarsal head. This does not probe to bone and there is no purulence. As far as I can tell she is completed the Septra although there were problems with exact length of time and I think compliance. In any case I gave her 6 weeks. I have reviewed her PAD above. 11/15; right first metatarsal head. Again protruding subcutaneous tissue but this does not have any adherence to surrounding skin. Undermining circumferentially. There is no palpable bone. Not grossly purulent. We have been using Iodoflex to try to get some adherence to clean off the surface but no real improvement. I Karla not think they actually had enough supplies listening to the patient. She completed the 6 weeks of Septra I gave her, but I am not sure about the compliance with the dates i.e. may have missed days taken 1 a day etc. The patient has a vascular follow-up with Dr. Allyson Sabal this coming [name] i.e. in 3 days. By my read of arterial evaluations I Karla not think she is felt to be a candidate for any further revascularization. The last angiogram was in July. 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 She continues to smoke now up to 10 cigarettes a day. She is not in any pain. Wound has not improved but is certainly not worsened. I gave her 6 weeks of trimethoprim/sulfamethoxazole which she is completed in some fashion. 11/29 right first metatarsal head previous TMA. Wound actually looks somewhat better today still a lot of callus around  the wound on debris on the wound surface. UNFORTUNATELY the patient missed her appointment with Dr. Allyson Sabal and is not rebooked apparently until sometime in February 12/13; right first metatarsal wound actually looks some smaller today. I did not debride this today. She is using Iodoflex. When she came in last week she had a large abscess on her left anterior lower leg apparently after falling we send her to the ER to have this drained which was done. I cannot see any cultures x-ray was negative. She was apparently given 2 doses of IV antibiotics and discharged on Bactrim DS twice daily which she is still taking. As mentioned I cannot see any cultures. 12/29; culture of the abscess on the left leg I did last time showed Serratia. We gave her antibiotics. I note she was in the ER next day with bleeding which they controlled she is on anticoagulants.She comes in today to clinic and the area on her right plantar foot is miraculously very hyperkeratotic but healed [name], [name] ([id number]) [id number] Page 3 of 17 02/06/2023 [name] is a 62 year old female with a past medical history of uncontrolled type 2 diabetes with TMA to the right foot and fifth ray amputation to left foot. She reports a callus to the right first met head and on the lateral aspect of the left foot. She saw podiatry 3 days ago and had the callus debrided. There are no open wounds at this time. She follows up with podiatry in 2 weeks. Readmission: 04-19-2023 upon evaluation today patient appears to be doing well all things considered in regard to her wounds. She does have  several wounds however on her feet 1 on the left foot centrally along the lateral edge and then a wound on the right lower leg more posterior. On the right foot plantar distal at the transmetatarsal amputation site this is the worst. With that being said she tells me this has been going on for quite some time she has been seen by  podiatry they have done some debridements in the past. With that being said based on what we are seeing I believe that the patient's primary issue on the right side is that she is having some issues here with her arterial flow. The ABI on the right is 0.57 on the left is 1.04. With that being said this reading on the right is getting necessitated further evaluation by vascular. She tells me she has been seen by Dr. Gery Pray in the past and he stated there was nothing further to be done for the right leg. With that being said I think that it would be good to have a second opinion with vascular on this. Patient does have a history of diabetes mellitus type 2, peripheral vascular disease, and hypertension. The previous ABIs were performed on January 11, 2023. 04-26-2023 upon evaluation today patient appears to be doing about the same in regard to her wounds. She still has not had the arterial study at this point. I think this is something needs to be done as quickly as possible. Fortunately I Karla not see any evidence of active infection locally or systemically which is great news. No fevers, chills, nausea, vomiting, or diarrhea. 7/24; the patient has not been here in about a month and a half. She has open wounds on the plantar right foot second metatarsal head in the setting of a previous TMA, she has a new area on the left lateral foot and perhaps traumatic areas on the right anterior and left lateral lower legs. She has poorly controlled edema. The patient has known severe PAD has had multiple interventions in the right SFA. 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 She had an pressure of 60 at the dorsalis pedis giving her an ABI of 0.57 monophasic waveforms here. On the left her study was a lot better with an ABI 1.04 triphasic waveforms. The ABI might be falsely elevated on the left. I  think at her last angiogram she had occluded right SFA popliteal and tibial vessels with the peroneal vessel being the only vessel feeding her foot via collaterals. She comes in this time in basic over-the-counter shoes 06-14-2023 upon evaluation today patient appears to be doing well currently in regard to her wounds all things considered except for the left lateral foot where she has what appears to be a blister this is erythematous as well and concerned about infection. I discussed with her today that I Karla think we need to see about doing the Vi core culture and subsequently depending on the results of this we will initiate antibiotic treatment as necessary. With that being said right now I think that she is going to need to be aggressive and elevating her legs she has an appointment with vascular coming up around 3 August is what I believe her appointment date is. Nonetheless once we get clearance from the standpoint of vascular she does have good blood flow and we can subsequently try to see what we Karla about getting her edema  under much better control and then she really needs some much stronger compression than what we currently are able to Karla. 06-21-2023 upon evaluation today patient appears to be doing pretty well currently in regard to her wounds most of which seem to be showing signs of improvement. She has been require little bit of debridement in regard to the right plantar foot wound I will Karla this as carefully as possible as we are still working on establishing that she has sufficient blood flow at this point. This right side on previous testing seem to be somewhat questionable. Fortunately I Karla not see any signs of active infection locally nor systemically at this time. 06-28-2023 patient does have an appointment with vascular on 07-11-2023. With that being said right now she has been having some slow progress but still nonetheless good progress at this point. I am actually very pleased with  where we stand. I Karla not see any signs of active infection locally or systemically which is great news. No fevers, chills, nausea, vomiting, or diarrhea. 07-05-2023 upon evaluation today patient appears to be doing well currently in regard to her wound. She has been tolerating the dressing changes. She only has 1 wound left on the plantar aspect of the right foot. With that being said she does have a peg assist offloading shoe she has been using at this time. 8/28; patient with a wound over the plantar right foot metatarsal head. She was seen by Dr. Lenell Antu of vascular surgery. Noted that the right ABI was only 0.57 it was felt that she had progression versus a previous study. She had some stenosis in the SFA a 7599% stenosis in the proximal mid SFA mild stenosis in the distal SFA and mild stenosis in the proximal PTA. On the left she had a patent left femoral to ATA bypass graft with mild stenosis It was recommended that she undergo angiography for limb salvage on the right. That is being arranged as we speak. Her wound is on the right first metatarsal head she has been using collagen as the primary dressing. She tells me she is making every effort to stop smoking 07-19-2023 upon evaluation today patient appears to be doing okay in regard to her foot ulcer although her arterial procedure/revascularization keeps getting pushed back. She states that they are talking about pushing back and left nail due to the fact that she changed insurances and I Karla not have a being "updated insurance card number". With that being said I explained to the patient that should be something that she should be able to get on line easily by making an account. She is also called [place] [facility] and they said they were sending it but it would be about a week before described by next Wednesday. She really does not have time to be holding on this she needs to be proceeding with intervention as quickly as possible. 07-26-2023 upon  evaluation today patient appears to be doing well currently in regard to her wound. This is actually showing signs of looking about the same I did clear a lot of callus up last week this actually looks better to me from an overall visual standpoint than last week though it is a little larger because of the callus I had removed. With that being said I feel like she is actually making really good progress here towards complete closure. No fevers, chills, nausea, vomiting, or diarrhea. 08-09-2023 upon evaluation today patient presents for follow-up concerning her ongoing issues with her  right plantar foot ulcer. She is going to be having arteriogram on [name] that is just just 2 days and hopefully following this it will actually allow for more aggressive healing in regard to the right plantar foot. Today I am going to perform some debridement to clear away some of the necrotic debris. 08-23-2023 upon evaluation patient's wound really appears to be doing about the same. She did have her arteriogram with Dr. Lenell Antu and that was on 9-27- 2024. Unfortunately it was noted that the patient had no good options for further revascularization. They plan to reevaluate there in a month but she is stated to be at "high risk for major amputation". 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 Subsequently this means that there is really not good blood flow into the ankle and foot which means that the patient really does not have a good chance of getting this to heal. 08-30-2023 upon evaluation today patient appears to be doing well currently in regard to her wound. She has been tolerating the dressing changes without complication and in general I Karla feel that the patient is doing quite well at this time. Unfortunately with the blood flow being so low and really no real  vascular options to improve blood flow she really has to try to stay off of this is much as possible. 09-06-2023 upon evaluation today patient appears to be doing well currently in regard to the wound on her plantar foot it does not seem to be any worse is not terribly better but again there is really not much we can Karla from a blood flow standpoint at this time unfortunately. There does not appear to be any signs of active infection locally or systemically which is good news. She does have a wound on the posterior ankle location and the right leg as well. She also has an area on the left lateral foot. 09-13-2023 upon evaluation today patient appears to be doing decently well all things considered in regard to her wounds. Fortunately I Karla not see any signs of worsening overall I Karla believe that the patient is making good headway towards closure which is good news. No fevers, chills, nausea, vomiting, or diarrhea. [name], [name] ([id number]) [id number] Page 4 of 17 11/20; since the patient was last here she was admitted to hospital from 11/9 through 09/27/2023. She had atrial fibrillation with rapid ventricular response she underwent successful cardioversion to normal sinus rhythm. She has noted to have stage III chronic renal failure, heart failure with preserved ejection fraction. She was discharged home apparently with a furosemide infusion pump although I Karla not see that on her discharge summary. She is listed as having torsemide to 100 mg/day. Unfortunately in any case she is gaining weight quite substantially per description of the patient. She has significant bilateral lower extremity edema but nonpitting. She has no open wounds on the left leg and right leg. She was not discharged with any home health and I am not really clear anybody is dressing these wounds. She has known PAD She has the original wounds on the right first met head and right posterior Achilles area.  New wounds on the right anteriorly and right posteriorly and on the left laterally and posteriorly. All her wounds are draining clear edema fluid 12/5; since the patient was last here she was admitted to hospital from  11/26 through 11/29. She had acute on chronic diastolic congestive heart failure. She was also in the ER on 12/3 with a small abscess in her right buttock. She was put on doxycycline. She had her legs wrapped when she left the hospital but she took these off 2-3 deep days ago. There is very significant bilateral lower extremity edema which she is tense. She has wounds on her right first metatarsal head right anterior lower extremity right posterior lower extremity x 2 and her left lateral and left posterior lower extremity. The areas on the lower extremity looked mostly like venous wounds although her complicating systemic fluid volume overload is no doubt contributing. We have been using silver alginate and Kerlix Coban. As mentioned she took these off a few days ago The patient saw Dr. Lenell Antu on 10/10/2023. She is status post right femoral-popliteal angioplasty and stenting 12/12; patient with wounds on the right greater than left lower leg. She arrives in clinic today with the edema control much better even though we just use kerlix Coban. I Karla not know what dose of diuretic she is on. She has wounds on the right posterior leg right lateral leg and more superficial areas on the left lateral leg. She also has areas on the right first metatarsal head. The patient has significant PAD status post right femoropopliteal angioplasty and stenting. Also chronic venous insufficiency Electronic Signature(s) Signed: 10/26/2023 5:17:16 PM By: Baltazar Najjar MD Entered By: Baltazar Najjar on 10/26/2023 11:49:19 -------------------------------------------------------------------------------- Physical Exam Details Patient Name: Date of Service: Karla Bigness, Karla [name] 10/26/2023 12:30 PM Medical  Record Number: [id number] Patient Account Number: [id number] Date of Birth/Sex: Treating RN: 05/15/1961 (62 y.o. F) Primary Care Provider: [name] Other Clinician: Referring Provider: Treating Provider/Extender: Josephine Cables Weeks in Treatment: 27 Constitutional Sitting or standing Blood Pressure is within target range for patient.. Pulse regular and within target range for patient.[name] Respirations regular, non-labored and within target range.. Temperature is normal and within the target range for the patient.[name] Appears in no distress. Notes Wound exam; she has scattered superficial wounds of the right anterior, left lateral and left posterior lower legs. Her edema control in both legs is better. Electronic Signature(s) Signed: 10/26/2023 5:17:16 PM By: Baltazar Najjar MD Entered By: Baltazar Najjar on 10/26/2023 11:51:02 -------------------------------------------------------------------------------- Physician Orders Details Patient Name: Date of Service: Karla Bigness, Karla [name] 10/26/2023 12:30 PM Medical Record Number: [id number] Patient Account Number: [id number] Date of Birth/Sex: Treating RN: 03-Jun-1961 (62 y.o. [name] Primary Care Provider: [name] Other Clinician: Referring Provider: Treating Provider/Extender: Leotis Pain in Treatment: [address] / Phone Orders: No KADIN, SABEY ([id number]) [id number] Page 5 of 17 Diagnosis Coding ICD-10 Coding Code Description 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 L97.522 Non-pressure chronic ulcer of other part of left foot with fat layer exposed E11.622 Type 2 diabetes mellitus with other skin ulcer L97.812 Non-pressure chronic ulcer of other part of right lower leg with fat layer exposed I73.89 Other specified peripheral vascular diseases I87.331  Chronic venous hypertension (idiopathic) with ulcer and inflammation of right lower extremity L97.828 Non-pressure chronic ulcer of other part of left lower leg with other specified severity L89.322 Pressure ulcer of left buttock, stage 2 Follow-up Appointments Return appointment in 3 weeks. - Dr Leanord [facility] (In clinic) Topical Lidocaine 5% applied to wound bed Bathing/ Shower/ Hygiene May shower with protection but Karla  not get wound dressing(s) wet. Protect dressing(s) with water repellant cover (for example, large plastic bag) or a cast cover and may then take shower. Edema Control - Orders / Instructions Elevate legs to the level of the heart or above for 30 minutes daily and/or when sitting for 3-4 times a day throughout the day. Avoid standing for long periods of time. Off-Loading Open toe surgical shoe to: - with Peg Assist in shoe. Limit walking on foot. Stay off the foot to minimize pressure to bottom of foot. Additional Orders / Instructions Stop/Decrease Smoking Follow Nutritious Diet Non Wound Condition Left Lower Extremity pply the following to affected area as directed: - left foot- apply betadine to affected area cover with ABD pad, secure with kerlix tubigrip size E. A Home Health Wound #23 Right Metatarsal head first No change in wound care orders this week; continue Home Health for wound care. May utilize formulary equivalent dressing for wound treatment orders unless otherwise specified. - Silver alginate to lower leg wounds, cover with ABD, Karla not use xeroform or Vaseline gauze over silver aginate. Wrap with kerlix and coban Poly mem with boarder foam dressing to gluteal wound New wound care orders this week; continue Home Health for wound care. May utilize formulary equivalent dressing for wound treatment orders unless otherwise specified. - Pt. will come to Wound Clinic every 3weeks-Please perform wound dressing changes 2 x for 2 week (i.e. the week pt. is NOT  coming to the Wound Clinic) and the alternate week-Pt. will [facility] an appointment [facility] change wound dressings x1 that week. Wound dressings- Silver alginate,cover with gauze or ABD and Bilateral Kerlix and Coban to Lower Legs. Other Home Health Orders/Instructions: - [facility] Wound #29 Right,Anterior Lower Leg No change in wound care orders this week; continue Home Health for wound care. May utilize formulary equivalent dressing for wound treatment orders unless otherwise specified. - Silver alginate to lower leg wounds, cover with ABD, Karla not use xeroform or Vaseline gauze over silver aginate. Wrap with kerlix and coban Poly mem with boarder foam dressing to gluteal wound New wound care orders this week; continue Home Health for wound care. May utilize formulary equivalent dressing for wound treatment orders unless otherwise specified. - Pt. will come to Wound Clinic every 3weeks-Please perform wound dressing changes 2 x for 2 week (i.e. the week pt. is NOT coming to the Wound Clinic) and the alternate week-Pt. will have an appointment [facility] change wound dressings x1 that week. Wound dressings- Silver alginate,cover with gauze or ABD and Bilateral Kerlix and Coban to Lower Legs. Other Home Health Orders/Instructions: - [facility] Wound #30 Right,Distal,Posterior Lower Leg No change in wound care orders this week; continue Home Health for wound care. May utilize formulary equivalent dressing for wound treatment orders unless otherwise specified. - Silver alginate to lower leg wounds, cover with ABD, Karla not use xeroform or Vaseline gauze over silver aginate. Wrap with kerlix and coban Poly mem with boarder foam dressing to gluteal wound New wound care orders this week; continue Home Health for wound care. May utilize formulary equivalent dressing for wound treatment orders unless otherwise specified. - Pt. will come to Wound Clinic  every 3weeks-Please perform wound dressing changes 2 x for 2 week (i.e. the week pt. is NOT coming to the Wound Clinic) and the alternate week-Pt. will have an appointment at [facility] change wound dressings x1 that week. Wound  dressings- Silver alginate,cover with gauze or ABD and Bilateral Kerlix and Coban to Lower Legs. Other Home Health Orders/Instructions: - [facility] Wound #32 Left,Lateral Lower Leg No change in wound care orders this week; continue Home Health for wound care. May utilize formulary equivalent dressing for wound treatment orders unless otherwise specified. - Silver alginate to lower leg wounds, cover with ABD, Karla not use xeroform or Vaseline gauze over silver aginate. Wrap with kerlix and coban Poly mem with boarder foam dressing to gluteal wound New wound care orders this week; continue Home Health for wound care. May utilize formulary equivalent dressing for wound treatment CHASITIE, RITCHIE ([id number]) [phone].pdf Page 6 of 17 orders unless otherwise specified. - Pt. will come to Wound Clinic every 3weeks-Please perform wound dressing changes 2 x for 2 week (i.e. the week pt. is NOT coming to the Wound Clinic) and the alternate week-Pt. will [facility] an appointment [facility] change wound dressings x1 that week. Wound dressings- Silver alginate,cover with gauze or ABD and Bilateral Kerlix and Coban to Lower Legs. Other Home Health Orders/Instructions: - [facility] Wound #34 Left,Proximal,Lateral Lower Leg No change in wound care orders this week; continue Home Health for wound care. May utilize formulary equivalent dressing for wound treatment orders unless otherwise specified. - Silver alginate to lower leg wounds, cover with ABD, Karla not use xeroform or Vaseline gauze over silver aginate. Wrap with kerlix and coban Poly mem with boarder foam dressing to gluteal wound New wound care orders this  week; continue Home Health for wound care. May utilize formulary equivalent dressing for wound treatment orders unless otherwise specified. - Pt. will come to Wound Clinic every 3weeks-Please perform wound dressing changes 2 x for 2 week (i.e. the week pt. is NOT coming to the Wound Clinic) and the alternate week-Pt. will [facility] an appointment [facility] change wound dressings x1 that week. Wound dressings- Silver alginate,cover with gauze or ABD and Bilateral Kerlix and Coban to Lower Legs. Other Home Health Orders/Instructions: - [facility] Wound #35 Left,Distal,Lateral Lower Leg No change in wound care orders this week; continue Home Health for wound care. May utilize formulary equivalent dressing for wound treatment orders unless otherwise specified. - Silver alginate to lower leg wounds, cover with ABD, Karla not use xeroform or Vaseline gauze over silver aginate. Wrap with kerlix and coban Poly mem with boarder foam dressing to gluteal wound New wound care orders this week; continue Home Health for wound care. May utilize formulary equivalent dressing for wound treatment orders unless otherwise specified. - Pt. will come to Wound Clinic every 3weeks-Please perform wound dressing changes 2 x for 2 week (i.e. the week pt. is NOT coming to the Wound Clinic) and the alternate week-Pt. will have an appointment [facility] change wound dressings x1 that week. Wound dressings- Silver alginate,cover with gauze or ABD and Bilateral Kerlix and Coban to Lower Legs. Other Home Health Orders/Instructions: - [facility] Wound #36 Left Gluteal fold No change in wound care orders this week; continue Home Health for wound care. May utilize formulary equivalent dressing for wound treatment orders unless otherwise specified. - Silver alginate to lower leg wounds, cover with ABD, Karla not use xeroform or Vaseline gauze over silver aginate. Wrap with kerlix and  coban Poly mem with boarder foam dressing to gluteal wound New wound care orders this week; continue Home Health for wound care. May utilize formulary  equivalent dressing for wound treatment orders unless otherwise specified. - Pt. will come to Wound Clinic every 3weeks-Please perform wound dressing changes 2 x for 2 week (i.e. the week pt. is NOT coming to the Wound Clinic) and the alternate week-Pt. will have an appointment [facility] change wound dressings x1 that week. Wound dressings- Silver alginate,cover with gauze or ABD and Bilateral Kerlix and Coban to Lower Legs. Other Home Health Orders/Instructions: - [facility] Wound Treatment Wound #23 - Metatarsal head first Wound Laterality: Right Cleanser: Soap and Water 3 x Per Week/30 Days Discharge Instructions: May shower and wash wound with dial antibacterial soap and water prior to dressing change. Cleanser: Wound Cleanser 3 x Per Week/30 Days Discharge Instructions: Cleanse the wound with wound cleanser prior to applying a clean dressing using gauze sponges, not tissue or cotton balls. Peri-Wound Care: Sween Lotion (Moisturizing lotion) 3 x Per Week/30 Days Discharge Instructions: Apply moisturizing lotion as directed Prim Dressing: Maxorb Extra Ag+ Alginate Dressing, 2x2 (in/in) 3 x Per Week/30 Days ary Discharge Instructions: Apply to wound bed as instructed Secondary Dressing: ABD Pad, 8x10 3 x Per Week/30 Days Discharge Instructions: Apply over primary dressing as directed. Secured With: Coban Self-Adherent Wrap 4x5 (in/yd) 3 x Per Week/30 Days Discharge Instructions: Secure with Coban as directed. Secured With: [employer], 4.5x3.1 (in/yd) 3 x Per Week/30 Days Discharge Instructions: Secure with Kerlix as directed. Secured With: 31M Medipore H Soft Cloth Surgical T ape, 4 x 10 (in/yd) 3 x Per Week/30 Days Discharge Instructions: Secure with tape as directed. Wound #29 - Lower Leg Wound  Laterality: Right, Anterior Cleanser: Soap and Water 3 x Per Week/30 Days Discharge Instructions: May shower and wash wound with dial antibacterial soap and water prior to dressing change. Cleanser: Wound Cleanser 3 x Per Week/30 Days Discharge Instructions: Cleanse the wound with wound cleanser prior to applying a clean dressing using gauze sponges, not tissue or cotton balls. Peri-Wound Care: Sween Lotion (Moisturizing lotion) 3 x Per Week/30 Days Discharge Instructions: Apply moisturizing lotion as directed Prim Dressing: Maxorb Extra Ag+ Alginate Dressing, 2x2 (in/in) 3 x Per Week/30 Days ary Discharge Instructions: Apply to wound bed as instructed Secondary Dressing: ABD Pad, 8x10 3 x Per Week/30 Days Discharge Instructions: Apply over primary dressing as directed. [name], [name] ([id number]) [id number] Page 7 of 17 Secured With: Coban Self-Adherent Wrap 4x5 (in/yd) 3 x Per Week/30 Days Discharge Instructions: Secure with Coban as directed. Secured With: [employer], 4.5x3.1 (in/yd) 3 x Per Week/30 Days Discharge Instructions: Secure with Kerlix as directed. Secured With: 31M Medipore H Soft Cloth Surgical T ape, 4 x 10 (in/yd) 3 x Per Week/30 Days Discharge Instructions: Secure with tape as directed. Wound #30 - Lower Leg Wound Laterality: Right, Posterior, Distal Cleanser: Soap and Water 3 x Per Week/30 Days Discharge Instructions: May shower and wash wound with dial antibacterial soap and water prior to dressing change. Cleanser: Wound Cleanser 3 x Per Week/30 Days Discharge Instructions: Cleanse the wound with wound cleanser prior to applying a clean dressing using gauze sponges, not tissue or cotton balls. Peri-Wound Care: Sween Lotion (Moisturizing lotion) 3 x Per Week/30 Days Discharge Instructions: Apply moisturizing lotion as directed Prim Dressing: Maxorb Extra Ag+ Alginate Dressing, 2x2 (in/in) 3 x Per Week/30 Days ary Discharge  Instructions: Apply to wound bed as instructed Secondary Dressing: ABD Pad, 8x10 3 x Per Week/30 Days Discharge Instructions: Apply over primary dressing as directed. Secured With: [employer]  Wrap 4x5 (in/yd) 3 x Per Week/30 Days Discharge Instructions: Secure with Coban as directed. Secured With: [employer], 4.5x3.1 (in/yd) 3 x Per Week/30 Days Discharge Instructions: Secure with Kerlix as directed. Secured With: 75M Medipore H Soft Cloth Surgical T ape, 4 x 10 (in/yd) 3 x Per Week/30 Days Discharge Instructions: Secure with tape as directed. Wound #32 - Lower Leg Wound Laterality: Left, Lateral Cleanser: Soap and Water 3 x Per Week/30 Days Discharge Instructions: May shower and wash wound with dial antibacterial soap and water prior to dressing change. Cleanser: Wound Cleanser 3 x Per Week/30 Days Discharge Instructions: Cleanse the wound with wound cleanser prior to applying a clean dressing using gauze sponges, not tissue or cotton balls. Peri-Wound Care: Sween Lotion (Moisturizing lotion) 3 x Per Week/30 Days Discharge Instructions: Apply moisturizing lotion as directed Prim Dressing: Maxorb Extra Ag+ Alginate Dressing, 2x2 (in/in) 3 x Per Week/30 Days ary Discharge Instructions: Apply to wound bed as instructed Secondary Dressing: ABD Pad, 8x10 3 x Per Week/30 Days Discharge Instructions: Apply over primary dressing as directed. Secured With: Coban Self-Adherent Wrap 4x5 (in/yd) 3 x Per Week/30 Days Discharge Instructions: Secure with Coban as directed. Secured With: [employer], 4.5x3.1 (in/yd) 3 x Per Week/30 Days Discharge Instructions: Secure with Kerlix as directed. Secured With: 75M Medipore H Soft Cloth Surgical T ape, 4 x 10 (in/yd) 3 x Per Week/30 Days Discharge Instructions: Secure with tape as directed. Wound #34 - Lower Leg Wound Laterality: Left, Lateral, Proximal Cleanser: Soap and Water 3 x Per Week/30 Days Discharge Instructions: May shower  and wash wound with dial antibacterial soap and water prior to dressing change. Cleanser: Wound Cleanser 3 x Per Week/30 Days Discharge Instructions: Cleanse the wound with wound cleanser prior to applying a clean dressing using gauze sponges, not tissue or cotton balls. Peri-Wound Care: Sween Lotion (Moisturizing lotion) 3 x Per Week/30 Days Discharge Instructions: Apply moisturizing lotion as directed Prim Dressing: Maxorb Extra Ag+ Alginate Dressing, 2x2 (in/in) 3 x Per Week/30 Days ary Discharge Instructions: Apply to wound bed as instructed Secondary Dressing: ABD Pad, 8x10 3 x Per Week/30 Days Discharge Instructions: Apply over primary dressing as directed. DARRYN, BROMBERG ([id number]) [id number] Page 8 of 17 Secured With: Coban Self-Adherent Wrap 4x5 (in/yd) 3 x Per Week/30 Days Discharge Instructions: Secure with Coban as directed. Secured With: [employer], 4.5x3.1 (in/yd) 3 x Per Week/30 Days Discharge Instructions: Secure with Kerlix as directed. Secured With: 75M Medipore H Soft Cloth Surgical T ape, 4 x 10 (in/yd) 3 x Per Week/30 Days Discharge Instructions: Secure with tape as directed. Wound #35 - Lower Leg Wound Laterality: Left, Lateral, Distal Cleanser: Soap and Water 3 x Per Week/30 Days Discharge Instructions: May shower and wash wound with dial antibacterial soap and water prior to dressing change. Cleanser: Wound Cleanser 3 x Per Week/30 Days Discharge Instructions: Cleanse the wound with wound cleanser prior to applying a clean dressing using gauze sponges, not tissue or cotton balls. Peri-Wound Care: Sween Lotion (Moisturizing lotion) 3 x Per Week/30 Days Discharge Instructions: Apply moisturizing lotion as directed Prim Dressing: Maxorb Extra Ag+ Alginate Dressing, 2x2 (in/in) 3 x Per Week/30 Days ary Discharge Instructions: Apply to wound bed as instructed Secondary Dressing: ABD Pad, 8x10 3 x Per Week/30 Days Discharge  Instructions: Apply over primary dressing as directed. Secured With: Coban Self-Adherent Wrap 4x5 (in/yd) 3 x Per Week/30 Days Discharge Instructions: Secure with Coban as directed. Secured With: [employer], 4.5x3.1 (in/yd) 3  x Per Week/30 Days Discharge Instructions: Secure with Kerlix as directed. Secured With: 19M Medipore H Soft Cloth Surgical T ape, 4 x 10 (in/yd) 3 x Per Week/30 Days Discharge Instructions: Secure with tape as directed. Wound #36 - Gluteal fold Wound Laterality: Left Cleanser: Soap and Water Discharge Instructions: May shower and wash wound with dial antibacterial soap and water prior to dressing change. Cleanser: Vashe 5.8 (oz) Discharge Instructions: Cleanse the wound with Vashe prior to applying a clean dressing using gauze sponges, not tissue or cotton balls. Prim Dressing: PolyMem Non-Adhesive Dressing, 4x4 in ary Discharge Instructions: Apply to wound bed as instructed Secondary Dressing: ALLEVYN Gentle Border, 5x5 (in/in) Discharge Instructions: Apply over primary dressing as directed. Patient Medications llergies: gabapentin, Lyrica A Notifications Medication Indication Start End 10/26/2023 lidocaine DOSE topical 4 % cream - cream topical once daily Electronic Signature(s) Signed: 10/26/2023 4:57:37 PM By: Redmond Pulling RN, BSN Signed: 10/26/2023 5:17:16 PM By: Baltazar Najjar MD Entered By: Redmond Pulling on 10/26/2023 13:44:43 -------------------------------------------------------------------------------- Problem List Details Patient Name: Date of Service: Karla Bigness, Karla [name] 10/26/2023 12:30 PM Medical Record Number: [id number] Patient Account Number: [id number] Date of Birth/Sex: Treating RN: 1961-05-20 (62 y.o. JOSILYN, MILANOWSKI ([id number]) [id number] Page 9 of 17 Primary Care Provider: [name] Other Clinician: Referring Provider: Treating Provider/Extender: Josephine Cables Weeks in  Treatment: 27 Active Problems ICD-10 Encounter Code Description Active Date MDM Diagnosis 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 2 diabetes mellitus with foot ulcer 04/19/2023 No Yes L97.512 Non-pressure chronic ulcer of other part of right foot with fat layer exposed 04/19/2023 No Yes L97.522 Non-pressure chronic ulcer of other part of left foot with fat layer exposed 04/19/2023 No Yes E11.622 Type 2 diabetes mellitus with other skin ulcer 04/19/2023 No Yes L97.812 Non-pressure chronic ulcer of other part of right lower leg with fat layer 04/19/2023 No Yes exposed I73.89 Other specified peripheral vascular diseases 04/19/2023 No Yes I87.331 Chronic venous hypertension (idiopathic) with ulcer and inflammation of right 04/19/2023 No Yes lower extremity L97.828 Non-pressure chronic ulcer of other part of left lower leg with other specified 10/04/2023 No Yes severity L89.322 Pressure ulcer of left buttock, stage 2 10/26/2023 No Yes Inactive Problems Resolved Problems Electronic Signature(s) Signed: 10/26/2023 5:17:16 PM By: Baltazar Najjar MD Entered By: Baltazar Najjar on 10/26/2023 11:47:36 -------------------------------------------------------------------------------- Progress Note Details Patient Name: Date of Service: Karla Bigness, Karla [name] 10/26/2023 12:30 PM Medical Record Number: [id number] Patient Account Number: [id number] Date of Birth/Sex: Treating RN: 1961/10/28 (62 y.o. F) Primary Care Provider: [name] Other Clinician: Referring Provider: Treating Provider/Extender: Josephine Cables Weeks in Treatment: [address] ([id number]) [id number] Page 10 of 17 History of Present Illness (HPI) This 62 year old patient who has a very long significant history of diabetes mellitus, previous alcohol and nicotine abuse, chronic data disease, COPD, diabetes mellitus, height hypertension, critical lower limb ischemia with several wounds being managed at the  wound Center at [facility] for over a year. She was recently in the ER at [facility] and was referred to our center. He has had a long history of critical limb ischemia and over a period of time has had balloon angioplasties in March 2016, endarterectomy of the left carotid, lower extremity angiogram and treatment by Dr. Nanetta Batty, several cardiac catheterizations. Most recently she had an x-ray of her left foot while in the ER which showed no acute abnormality. during this ER visit she was started on ciprofloxacin and asked to continue with the wound  care physicians at [facility]. Her last ABI done in June 2017 showed the right side to be 0.28 in the left side to be 0.48. Her right toe brachial index was 0.17 on the right and 0.27 on the left. her last hemoglobin A1c was 11.1% She continues to smoke about a pack of cigarettes a day. 03/28/2017 -- -- right foot x-ray -- IMPRESSION:Areas of soft tissue swelling. Mild subluxation second PIP joint. No frank dislocation or fracture. No erosive change or bony destruction. No soft tissue ulceration or radiopaque foreign body evident by radiography. There is plantar fascia calcification with a nearby inferior calcaneal spur. There are foci of arterial vascular calcification. 04/04/2017 -- the patient continues to be noncompliant and continues to complain of a lot of pain and has not done anything about quitting smoking Readmission: 06/26/18 on evaluation today patient presents for reevaluation she has not been seen in our office since May 2018. Since she was last seen here in our office she has undergone testing at Dr. Durenda Age office where it was revealed that she had an ABI of 0.68 with her previous ABI being 0.64 and a left ABI of 0.38 with previous ABI being 0.34 this study which was performed on 04/05/18 was pretty much equivalent to the study performed on 11/22/17. The findings in the end revealed on the right would appear to be  moderate right lower extremity arterial disease on the left Dr. Allyson Sabal states moderate in the report but unfortunately this appears to be much more severe at 0.38 compared to the right. Subsequently the patient was scheduled to have angiography with Dr. Allyson Sabal on 04/12/18. This was however canceled due to the fact that the patient was found to have chronic kidney disease stage III and it was to the point that he did not feel that it will be safe to pursue angiography at that point. She has not been on any antibiotics recently. At this point Dr. Allyson Sabal has not rescheduled anything as far as the injured [place] according to the patient he is somewhat reluctant to Karla so. Nonetheless with her diminished blood flow this is gonna make it somewhat difficult for her to heal. 07/05/18; this is a patient I have not seen previously. She has very significant PAD as noted above. She apparently has had revascularization efforts by Dr. Allyson Sabal on the right on 3 occasions to the patient. She was supposed to have an attempted angiography on the left however this was canceled apparently because of stage III chronic renal failure. I will need to research all of this. She complains of significant pain in the wound and has claudication enough that when she walks to the end of the driveway she has to stop. She is been using silver alginate to the wounds on her legs and Iodoflex to the area on her left second toe. 07/13/18 on evaluation today patient's wounds actually appear to be doing about the same. She has an appointment she tells me within the next month that is September 2019 with Dr. Allyson Sabal to discuss options to see if there's anything else that you can recommend or Karla for her. Nonetheless obviously what we're trying to Karla is Karla what we can to save her leg and in turn prevent any additional worsening or damage. None in the meantime we been mainly trying to manage her ulcers as best we can. 07/27/18; some improvement in the  multiplicity of wounds on her left lower extremity and foot. She's been using silver alginate.  I was unable to determine that she actually has an appointment with Dr. Allyson Sabal. We are checking into this The patient's wound includes Left lateral foot, left plantar heel, her left anterior calf wound looks close to me, left fourth toe is very close to closed and the left medial calf is perhaps the largest open area READMISSION 02/19/2021 This is a now 62 year old woman with type 2 diabetes and continued cigarette smoking. She has known PAD. She was last in this clinic in 2019 at that point with wounds on her left foot. She left in a nonhealed state. On 06/28/2019 I see she had a left femoropopliteal by vein and vascular with an amputation of the fifth ray. These managed to heal over. Her last arterial studies were in February 2021. This showed an absent waveform at the posterior tibial artery on the right a dampened monophasic dorsalis pedis on the right of 0.44. On the left again the PTA TA was absent her dorsalis pedis was 0.99 triphasic and her great toe pressure was 0.65. This would have been after her revascularization. Once again she tells me that Dr. Allyson Sabal has done revascularization on the right leg on 3 different occasions. She has a right transmetatarsal amputation apparently done by Dr. Lajoyce Corners remotely but I Karla not see a note for these right lower extremity revascularization but I may not of looked back far enough. In any case she says that the she has a wound on the plantar aspect of the right transmetatarsal amputation site there is been there for several months. More recently she fell and had a wound on her right medial lower leg she had 5 sutures placed in the ER and then subsequently she has developed an area on the medial lower leg which was a blister that opened up. Past medical history includes type 2 diabetes, PAD, chronic renal failure, congestive heart failure, right transmetatarsal  amputation, left fifth ray amputation, continued tobacco abuse, atrial fibrillation and cirrhosis. We did not attempt an ABI on the right leg today because of pain 02/26/2021; patient we admitted to the clinic last week. She has what looks to be 2 areas on her right medial and right anterior lower leg that look more like venous wounds but she has 1 on the first met head at the base of her right transmet. She has known severe PAD. She complains of a lot of pain although some of this may be neuropathic. Is difficult to exclude a component of claudication. Use silver alginate on the wounds on her legs and Iodoflex on the area on the first met head. She has an appointment with Dr. Allyson Sabal on 5/25 although I will text him and discussed the situation. She is have poorly controlled diabetic. She has has stage IIIb chronic renal failure 4/22; patient presents for 1 week follow-up. The 2 areas on her right medial and right anterior lower leg appear well-healing. She has been using silver alginate to this area without issues. She has a first met head ulcer and it is unclear how long this has been there as it was discovered in the ED earlier this month when she was being evaluated for another issue. Iodoflex has been used at this area. 4/29; patient presents for 1 week follow-up. She has been using silver alginate to the leg wounds and Iodoflex to the plantar foot wound. She has had this wrapped with Coban and Kerlix. She has no concerns or complaints today. 5/6; this is a difficult wound on the right  plantar foot transmit site. We are not making a lot of progress. She had 2 more venous looking wounds on the right medial and right anterior lower leg 1 of which is healed. Her appointment with Dr. Allyson Sabal is on 5/25 5/16; she did not tolerate the offloading shoe we gave her in fact she had a fall with an abrasion on her right forearm she is now back in the modified small shoe which does not offload her foot properly.  Her appoint with Dr. Allyson Sabal is still on 5/25 we have been using Iodoflex. The area on the right anterior lower leg is healed 7/18; patient presents for follow-up however has not been seen in 2 months. She was last seen at the end of May. She had a fall at the end of June and was hospitalized. She has been unable to follow-up since then. For the wound she has only been keeping it covered with gauze. She is scheduled for an aortogram with lower extremity runoff at the end of July. 7/29; patient presents for 2-week follow-up. She has home health and they have been changing her dressings. She denies any issues and has no complaints today. She states she had a procedure where they opened up one of her vessels in her legs. She denies signs of infection. 8/5; patient presents for follow-up. She was recently in the hospital for bradycardia. She was noted to have cellulitis to the left lower extremity and [address] KRYSTALANN, BALLOG ([id number]) [id number] Page 11 of 17 was placed due to blisters and increased swelling. She reports improvement to her left leg since being in the hospital and stability check her right plantar foot wound. She denies signs of infection. 8/23; since I last saw this patient a lot has happened. She still has the area over the right first met head in the setting of previous transmetatarsal amputation. Firstly most importantly she underwent revascularization of her occluded right SFA. She underwent directional atherectomy followed by drug-coated balloon angioplasty. She has no named vessel below the knee hopefully the revascularization of the right SFA will improve collateral flow. It is not felt however that she has any endovascular options. She had follow-up arterial studies noninvasive on 8/9. These showed an ABI of 0.44 at the right PTA. Monophasic waveforms. On the left her great toe ABI was 0.68. Her follow-up arterial Doppler showed a 50 to 74% stenosis  in the proximal SFA and a 50 to 74% stenosis in the distal SFA mild to severe atherosclerosis noted throughout the extremity. Areas of shadowing plaque seen, unable to rule out higher grade stenosis she also had a fall apparently wearing a right foot forefoot off loader that we gave her. She therefore comes in in an ordinary running shoe today. Not certain if this is the fall that ended up in the hospital with bradycardia 9/6 area over the right first metatarsal head in the setting of her previous amputation and severe PAD. She is also a diabetic with known PAD status post attempt at revascularization. She is continued smoker Again the separation of visits in the clinic is somewhat disturbing if we are going to consider her for a total contact cast. She is not wearing anything to offload this stating it causes imbalance especially the forefoot off loader. She basically comes in in bedroom slippers She also had a fall this morning about an hour ago. She has a skin tear on the left dorsal forearm 9/13; areas over the right first metatarsal head in the  setting of previous TMA and severe PAD. Again she comes in here in slippers. We have been using Hydrofera Blue. We use MolecuLight on this which was essentially negative study 9/20; right first metatarsal head in the setting of her previous TMA and severe PAD. Same slipper type shoes. She says she cannot wear a forefoot off loader and for some reason she will not wear surgical shoes. She says she is smoking half a pack per day. 9/30; right first metatarsal head. Absolutely no improvement in fact there is tunneling superiorly very close to bone. She does not offload this properly at all. We use MolecuLight on this 2 weeks ago that did not show any surface bacteria we have been using silver collagen is a dressing She tells me she is down to 3 cigarettes a day I have offered encouragement and a treatment plan 10/6; right first metatarsal head. Exposed bone  this week which is not surprising. We have been using silver collagen. She is smoking 5 to 10 cigarettes a day 10/17; she has not been here in 10 days. The bone scraping that I did on 10/6 showed staph lugdunensis, staph epidermidis and Pseudomonas Alcalifenes. I put in for Septra DS 1 p.o. twice daily for 14 days last week would be but we could not reach her to start the antibiotic. Quinolones would have been a good alternative except she has multiple drug interactions. Septra would not cover what ever the Pseudomonas is but I am not really sure of the relevance here. I am going to try her on the Septra for 2 weeks. She has a probing wound on the right TMA that probes to bone. She claims to have just about stop smoking I reviewed her arterial status. She underwent a left fem-tib bypass by Dr. Arbie Cookey in 06/28/2019. Her last angiogram was in July of this year by Dr. Allyson Sabal. This showed an occluded right SFA the popliteal and tibial vessels were also occluded the peroneal vessel filled by collaterals and filled the PT and DP by communicating collaterals. She was not felt to have any additional and vascular options. Dr. Allyson Sabal noted that she he had previously revascularized her right SFA 3 times. 10/25; she is tolerating the Septra but says it makes her nauseated. I am going to continue this for an additional 2 weeks perhaps for a total of 6 weeks. Her wound does not look too much different. There is on the right first met metatarsal head. There is probing areas around the tissue that is in the middle of the wound. There is no purulence I cannot feel bone. The original source was for a bone scraping. 11/8; right first metatarsal head. This does not probe to bone and there is no purulence. As far as I can tell she is completed the Septra although there were problems with exact length of time and I think compliance. In any case I gave her 6 weeks. I have reviewed her PAD above. 11/15; right first metatarsal  head. Again protruding subcutaneous tissue but this does not have any adherence to surrounding skin. Undermining circumferentially. There is no palpable bone. Not grossly purulent. We have been using Iodoflex to try to get some adherence to clean off the surface but no real improvement. I Karla not think they actually had enough supplies listening to the patient. She completed the 6 weeks of Septra I gave her, but I am not sure about the compliance with the dates i.e. may have missed days taken 1 a day  etc. The patient has a vascular follow-up with Dr. Allyson Sabal this coming [name] i.e. in 3 days. By my read of arterial evaluations I Karla not think she is felt to be a candidate for any further revascularization. The last angiogram was in July. 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 She continues to smoke now up to 10 cigarettes a day. She is not in any pain. Wound has not improved but is certainly not worsened. I gave her 6 weeks of trimethoprim/sulfamethoxazole which she is completed in some fashion. 11/29 right first metatarsal head previous TMA. Wound actually looks somewhat better today still a lot of callus around the wound on debris on the wound surface. UNFORTUNATELY the patient missed her appointment with Dr. Allyson Sabal and is not rebooked apparently until sometime in February 12/13; right first metatarsal wound actually looks some smaller today. I did not debride this today. She is using Iodoflex.  When she came in last week she had a large abscess on her left anterior lower leg apparently after falling we send her to the ER to have this drained which was done. I cannot see any cultures x-ray was negative. She was apparently given 2 doses of IV antibiotics and discharged on Bactrim DS twice daily which she is still taking. As mentioned I cannot see any cultures. 12/29; culture of the abscess on the left leg I did last time showed Serratia. We gave her antibiotics. I note she was in the  ER next day with bleeding which they controlled she is on anticoagulants.She comes in today to clinic and the area on her right plantar foot is miraculously very hyperkeratotic but healed 02/06/2023 Ms. 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 She reports a callus to the right first met head and on the lateral aspect of the left foot. She saw podiatry 3 days ago and had the callus debrided. There are no open wounds at this time. She follows up with podiatry in 2 weeks. Readmission: 04-19-2023 upon evaluation today patient appears to be doing well all things considered in regard to her wounds. She does have several wounds however on her feet 1 on the left foot centrally along the lateral edge and then a wound on the right lower leg more posterior. On the right foot plantar distal at the transmetatarsal amputation site this is the worst. With that being said she tells me this has been going on for quite some time she has been seen by podiatry they have done some debridements in the past. With that being said based on what we are seeing I believe that the patient's primary issue on the right side is that she is having some issues here with her arterial flow. The ABI on the right is 0.57 on the left is 1.04. With that being said this reading on the right is getting necessitated further evaluation by vascular. She tells me she has been seen by Dr. Gery Pray in the past and he stated there was nothing further to be done for the right leg. With that being said I think that it would be good to have a second opinion with vascular on this. Patient does have a history of diabetes mellitus type 2, peripheral vascular disease, and hypertension. The previous ABIs were performed on January 11, 2023. 04-26-2023 upon evaluation today patient appears to be doing about the  same in regard to her wounds. She  still has not had the arterial study at this point. I think this is something needs to be done as quickly as possible. Fortunately I Karla not see any evidence of active infection locally or systemically which is great news. No fevers, chills, nausea, vomiting, or diarrhea. 7/24; the patient has not been here in about a month and a half. She has open wounds on the plantar right foot second metatarsal head in the setting of a previous TMA, she has a new area on the left lateral foot and perhaps traumatic areas on the right anterior and left lateral lower legs. She has poorly controlled [name], [name] ([id number]) [id number] Page 12 of 17 edema. The patient has known severe PAD has had multiple interventions in the right SFA. 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 She had an pressure of 60 at the dorsalis pedis giving her an ABI of 0.57 monophasic waveforms here. On the left her study was a lot better with an ABI 1.04 triphasic waveforms. The ABI might be falsely elevated on the left. I think at her last angiogram she had occluded right SFA popliteal and tibial vessels with the peroneal vessel being the only vessel feeding her foot via collaterals. She comes in this time in basic over-the-counter shoes 06-14-2023 upon evaluation today patient appears to be doing well currently in regard to her wounds all things considered except for the left lateral foot where she has what appears to be a blister this is erythematous as well and concerned about infection. I discussed with her today that I Karla think we need to see about doing the Vi core culture and subsequently depending on the results of this we will initiate antibiotic treatment as necessary. With that being said right now I think that she is going to need to be aggressive and elevating her legs she has an appointment  with vascular coming up around 3 August is what I believe her appointment date is. Nonetheless once we get clearance from the standpoint of vascular she does have good blood flow and we can subsequently try to see what we Karla about getting her edema under much better control and then she really needs some much stronger compression than what we currently are able to Karla. 06-21-2023 upon evaluation today patient appears to be doing pretty well currently in regard to her wounds most of which seem to be showing signs of improvement. She has been require little bit of debridement in regard to the right plantar foot wound I will Karla this as carefully as possible as we are still working on establishing that she has sufficient blood flow at this point. This right side on previous testing seem to be somewhat questionable. Fortunately I Karla not see any signs of active infection locally nor systemically at this time. 06-28-2023 patient does have an appointment with vascular on 07-11-2023. With that being said right now she has been having some slow progress but still nonetheless good progress at this point. I am actually very pleased with where we stand. I Karla not see any signs of active infection locally or systemically which is great news. No fevers, chills, nausea, vomiting, or diarrhea. 07-05-2023 upon evaluation today patient appears to be doing well currently in regard to her wound. She has been tolerating the dressing changes. She only has 1 wound  left on the plantar aspect of the right foot. With that being said she does have a peg assist offloading shoe she has been using at this time. 8/28; patient with a wound over the plantar right foot metatarsal head. She was seen by Dr. Lenell Antu of vascular surgery. Noted that the right ABI was only 0.57 it was felt that she had progression versus a previous study. She had some stenosis in the SFA a 7599% stenosis in the proximal mid SFA mild stenosis in the distal SFA and  mild stenosis in the proximal PTA. On the left she had a patent left femoral to ATA bypass graft with mild stenosis It was recommended that she undergo angiography for limb salvage on the right. That is being arranged as we speak. Her wound is on the right first metatarsal head she has been using collagen as the primary dressing. She tells me she is making every effort to stop smoking 07-19-2023 upon evaluation today patient appears to be doing okay in regard to her foot ulcer although her arterial procedure/revascularization keeps getting pushed back. She states that they are talking about pushing back and left nail due to the fact that she changed insurances and I Karla not have a being "updated insurance card number". With that being said I explained to the patient that should be something that she should be able to get on line easily by making an account. She is also called [facility] [facility] and they said they were sending it but it would be about a week before described by next Wednesday. She really does not have time to be holding on this she needs to be proceeding with intervention as quickly as possible. 07-26-2023 upon evaluation today patient appears to be doing well currently in regard to her wound. This is actually showing signs of looking about the same I did clear a lot of callus up last week this actually looks better to me from an overall visual standpoint than last week though it is a little larger because of the callus I had removed. With that being said I feel like she is actually making really good progress here towards complete closure. No fevers, chills, nausea, vomiting, or diarrhea. 08-09-2023 upon evaluation today patient presents for follow-up concerning her ongoing issues with her right plantar foot ulcer. She is going to be having arteriogram on [name] that is just just 2 days and hopefully following this it will actually allow for more aggressive healing in regard to the right  plantar foot. Today I am going to perform some debridement to clear away some of the necrotic debris. 08-23-2023 upon evaluation patient's wound really appears to be doing about the same. She did have her arteriogram with Dr. Lenell Antu and that was on 9-27- 2024. Unfortunately it was noted that the patient had no good options for further revascularization. They plan to reevaluate there in a month but she is stated to be at "high risk for major amputation". 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 Subsequently this means that there is really not good blood flow into the ankle and foot which means that the patient really does not have a good chance of getting this to heal. 08-30-2023 upon evaluation today patient appears to be doing well currently in regard to her wound. She has been tolerating the dressing changes without complication and  in general I Karla feel that the patient is doing quite well at this time. Unfortunately with the blood flow being so low and really no real vascular options to improve blood flow she really has to try to stay off of this is much as possible. 09-06-2023 upon evaluation today patient appears to be doing well currently in regard to the wound on her plantar foot it does not seem to be any worse is not terribly better but again there is really not much we can Karla from a blood flow standpoint at this time unfortunately. There does not appear to be any signs of active infection locally or systemically which is good news. She does have a wound on the posterior ankle location and the right leg as well. She also has an area on the left lateral foot. 09-13-2023 upon evaluation today patient appears to be doing decently well all things considered in regard to her wounds. Fortunately I Karla not see any signs of worsening overall I Karla  believe that the patient is making good headway towards closure which is good news. No fevers, chills, nausea, vomiting, or diarrhea. 11/20; since the patient was last here she was admitted to hospital from 11/9 through 09/27/2023. She had atrial fibrillation with rapid ventricular response she underwent successful cardioversion to normal sinus rhythm. She has noted to have stage III chronic renal failure, heart failure with preserved ejection fraction. She was discharged home apparently with a furosemide infusion pump although I Karla not see that on her discharge summary. She is listed as having torsemide to 100 mg/day. Unfortunately in any case she is gaining weight quite substantially per description of the patient. She has significant bilateral lower extremity edema but nonpitting. She has no open wounds on the left leg and right leg. She was not discharged with any home health and I am not really clear anybody is dressing these wounds. She has known PAD She has the original wounds on the right first met head and right posterior Achilles area. New wounds on the right anteriorly and right posteriorly and on the left laterally and posteriorly. All her wounds are draining clear edema fluid 12/5; since the patient was last here she was admitted to hospital from 11/26 through 11/29. She had acute on chronic diastolic congestive heart failure. She was also in the ER on 12/3 with a small abscess in her right buttock. She was put on doxycycline. She had her legs wrapped when she left the hospital but she took these off 2-3 deep days ago. There is very significant bilateral lower extremity edema which she is tense. She has wounds on her right first metatarsal head right anterior lower extremity right posterior lower extremity x 2 and her left lateral and left posterior lower extremity. The areas on the lower extremity looked mostly like venous wounds although her complicating systemic fluid volume overload is  no doubt contributing. We have been using silver alginate and Kerlix Coban. As mentioned she took these off a few days ago The patient saw Dr. Lenell Antu on 10/10/2023. She is status post right femoral-popliteal angioplasty and stenting 12/12; patient with wounds on the right greater than left lower leg. She arrives in clinic today with the edema control much better even though we just use kerlix Coban. I Karla not know what dose of diuretic she is on. She has wounds on the right posterior leg right lateral leg and more superficial areas on the left lateral leg. [name], [name] ([id number]) [id number]  Page 13 of 17 She also has areas on the right first metatarsal head. The patient has significant PAD status post right femoropopliteal angioplasty and stenting. Also chronic venous insufficiency Objective Constitutional Sitting or standing Blood Pressure is within target range for patient.. Pulse regular and within target range for patient.[name] Respirations regular, non-labored and within target range.. Temperature is normal and within the target range for the patient.[name] Appears in no distress. Vitals Time Taken: 12:51 PM, Height: 64 in, Weight: 225 lbs, BMI: 38.6, Temperature: 98.3 F, Pulse: 91 bpm, Respiratory Rate: 18 breaths/min, Blood Pressure: 150/75 mmHg, Capillary Blood Glucose: 98 mg/dl. General Notes: Wound exam; she has scattered superficial wounds of the right anterior, left lateral and left posterior lower legs. Her edema control in both legs is better. Integumentary (Hair, Skin) Wound #23 status is Open. Original cause of wound was Gradually Appeared. The date acquired was: 04/18/2021. The wound has been in treatment 27 weeks. The wound is located on the Right Metatarsal head first. The wound measures 0.4cm length x 0.4cm width x 0.2cm depth; 0.126cm^2 area and 0.025cm^3 volume. There is Fat Layer (Subcutaneous Tissue) exposed. There is no tunneling noted, however, there  is undermining starting at 12:00 and ending at 12:00 with a maximum distance of 0.2cm. There is a medium amount of serosanguineous drainage noted. The wound margin is distinct with the outline attached to the wound base. There is large (67-100%) red, pale granulation within the wound bed. There is a small (1-33%) amount of necrotic tissue within the wound bed including Adherent Slough. The periwound skin appearance had no abnormalities noted for color. The periwound skin appearance exhibited: Callus. The periwound skin appearance did not exhibit: Dry/Scaly, Maceration. Periwound temperature was noted as No Abnormality. Wound #29 status is Open. Original cause of wound was Bump. The date acquired was: 08/20/2023. The wound has been in treatment 9 weeks. The wound is located on the Right,Anterior Lower Leg. The wound measures 2.8cm length x 3.7cm width x 0.1cm depth; 8.137cm^2 area and 0.814cm^3 volume. There is Fat Layer (Subcutaneous Tissue) exposed. There is no tunneling or undermining noted. There is a medium amount of serosanguineous drainage noted. The wound margin is distinct with the outline attached to the wound base. There is large (67-100%) red granulation within the wound bed. There is a small (1-33%) amount of necrotic tissue within the wound bed including Adherent Slough. The periwound skin appearance exhibited: Hemosiderin Staining. The periwound skin appearance did not exhibit: Callus, Crepitus, Excoriation, Induration, Rash, Scarring, Dry/Scaly, Maceration, Atrophie Blanche, Cyanosis, Ecchymosis, Mottled, Pallor, Rubor, Erythema. Periwound temperature was noted as No Abnormality. Wound #30 status is Open. Original cause of wound was Gradually Appeared. The date acquired was: 09/06/2023. The wound has been in treatment 7 weeks. The wound is located on the Right,Distal,Posterior Lower Leg. The wound measures 0.2cm length x 0.2cm width x 0.1cm depth; 0.031cm^2 area and 0.003cm^3 volume.  There is Fat Layer (Subcutaneous Tissue) exposed. There is no tunneling or undermining noted. There is a medium amount of serosanguineous drainage noted. The wound margin is distinct with the outline attached to the wound base. There is large (67-100%) red, pink granulation within the wound bed. There is a small (1-33%) amount of necrotic tissue within the wound bed including Adherent Slough. The periwound skin appearance exhibited: Hemosiderin Staining. The periwound skin appearance did not exhibit: Callus, Crepitus, Excoriation, Induration, Rash, Scarring, Dry/Scaly, Maceration, Atrophie Blanche, Cyanosis, Ecchymosis, Mottled, Pallor, Rubor, Erythema. Periwound temperature was noted as No Abnormality. Wound #31 status  is Healed - Epithelialized. Original cause of wound was Blister. The date acquired was: 10/04/2023. The wound has been in treatment 3 weeks. The wound is located on the Right,Proximal,Posterior Lower Leg. The wound measures 0cm length x 0cm width x 0cm depth; 0cm^2 area and 0cm^3 volume. There is no tunneling or undermining noted. There is a none present amount of drainage noted. The wound margin is flat and intact. There is no granulation within the wound bed. There is no necrotic tissue within the wound bed. The periwound skin appearance had no abnormalities noted for texture. The periwound skin appearance had no abnormalities noted for moisture. The periwound skin appearance had no abnormalities noted for color. Periwound temperature was noted as No Abnormality. Wound #32 status is Open. Original cause of wound was Blister. The date acquired was: 10/04/2023. The wound has been in treatment 3 weeks. The wound is located on the Left,Lateral Lower Leg. The wound measures 1.3cm length x 0.8cm width x 0.1cm depth; 0.817cm^2 area and 0.082cm^3 volume. There is Fat Layer (Subcutaneous Tissue) exposed. There is no tunneling or undermining noted. There is a medium amount of serosanguineous  drainage noted. There is large (67-100%) pink granulation within the wound bed. There is a small (1-33%) amount of necrotic tissue within the wound bed including Adherent Slough. The periwound skin appearance had no abnormalities noted for texture. The periwound skin appearance had no abnormalities noted for moisture. The periwound skin appearance exhibited: Hemosiderin Staining. Periwound temperature was noted as No Abnormality. Wound #33 status is Open. Original cause of wound was Blister. The date acquired was: 10/04/2023. The wound has been in treatment 3 weeks. The wound is located on the Left,Posterior Lower Leg. The wound measures 0cm length x 0cm width x 0cm depth; 0cm^2 area and 0cm^3 volume. There is no tunneling or undermining noted. There is a none present amount of drainage noted. The wound margin is flat and intact. There is no granulation within the wound bed. There is no necrotic tissue within the wound bed. The periwound skin appearance had no abnormalities noted for texture. The periwound skin appearance had no abnormalities noted for moisture. The periwound skin appearance exhibited: Hemosiderin Staining. Periwound temperature was noted as No Abnormality. Wound #34 status is Open. Original cause of wound was Gradually Appeared. The date acquired was: 10/19/2023. The wound has been in treatment 1 weeks. The wound is located on the Left,Proximal,Lateral Lower Leg. The wound measures 2cm length x 2cm width x 0.1cm depth; 3.142cm^2 area and 0.314cm^3 volume. There is Fat Layer (Subcutaneous Tissue) exposed. There is a medium amount of serous drainage noted. The wound margin is flat and intact. There is large (67-100%) red granulation within the wound bed. There is a small (1-33%) amount of necrotic tissue within the wound bed including Adherent Slough. The periwound skin appearance had no abnormalities noted for texture. The periwound skin appearance had no abnormalities noted for  moisture. The periwound skin appearance had no abnormalities noted for color. Periwound temperature was noted as No Abnormality. The periwound has tenderness on palpation. Wound #35 status is Open. Original cause of wound was Gradually Appeared. The date acquired was: 10/26/2023. The wound is located on the Left,Distal,Lateral Lower Leg. The wound measures 3.2cm length x 1.5cm width x 0.1cm depth; 3.77cm^2 area and 0.377cm^3 volume. There is Fat Layer (Subcutaneous Tissue) exposed. There is no tunneling or undermining noted. There is a medium amount of serosanguineous drainage noted. There is large (67- 100%) pink granulation within the wound bed.  There is a small (1-33%) amount of necrotic tissue within the wound bed including Adherent Slough. Wound #36 status is Open. Original cause of wound was Pressure Injury. The date acquired was: 10/26/2023. The wound is located on the Left Gluteal fold. The wound measures 0.5cm length x 0.5cm width x 0.1cm depth; 0.196cm^2 area and 0.02cm^3 volume. There is Fat Layer (Subcutaneous Tissue) exposed. There is no tunneling or undermining noted. There is a medium amount of serosanguineous drainage noted. There is large (67-100%) red granulation within the wound bed. There is a small (1-33%) amount of necrotic tissue within the wound bed including Adherent Slough. 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 - Dr Leanord Hawking Anesthetic: (In clinic) Topical Lidocaine 5% applied to wound bed Bathing/ Shower/ Hygiene: May shower with protection but Karla not get wound dressing(s) wet. Protect dressing(s) with water repellant cover (for example, large plastic bag) or a cast cover and may then take shower. Edema Control - Orders / Instructions: Elevate legs to the level of the heart or above for 30 minutes daily and/or when sitting for 3-4 times a day throughout the day. Avoid standing for long periods of time. Off-Loading: Open toe surgical shoe to: - with Peg Assist in shoe. Limit walking on foot. Stay off the foot to minimize pressure to bottom of foot. Additional Orders / Instructions: Stop/Decrease Smoking Follow Nutritious Diet Non Wound Condition: Apply the following to affected area as directed: - left foot- apply betadine to affected area cover with ABD pad, secure with kerlix tubigrip size E. Home Health: Wound #23 Right Metatarsal head first: No change in wound care orders this week; continue Home Health for wound care. May utilize formulary equivalent dressing for wound treatment orders unless otherwise specified. - Silver alginate to lower leg wounds, cover with ABD, Karla not use xeroform or Vaseline gauze over silver aginate. Wrap with kerlix and coban Poly mem with boarder foam dressing to gluteal wound New wound care orders this week; continue Home Health for wound care. May utilize formulary equivalent dressing for wound treatment orders unless otherwise specified. - Pt. will come to Wound Clinic every 3weeks-Please perform wound dressing changes 2 x for 2 week (i.e. the week pt. is NOT coming to the Wound Clinic) and the alternate week-Pt. will [facility] change wound dressings x1 that week.  Wound dressings- Silver alginate,cover with gauze or ABD and Bilateral Kerlix and Coban to Lower Legs. Other Home Health Orders/Instructions: - [facility] Wound #29 Right,Anterior Lower Leg: No change in wound care orders this week; continue Home Health for wound care. May utilize formulary equivalent dressing for wound treatment orders unless otherwise specified. - Silver alginate to lower leg wounds, cover with ABD, Karla not use xeroform or Vaseline gauze over silver aginate. Wrap with kerlix and coban Poly mem with boarder foam dressing  to gluteal wound New wound care orders this week; continue Home Health for wound care. May utilize formulary equivalent dressing for wound treatment orders unless otherwise specified. - Pt. will come to Wound Clinic every 3weeks-Please perform wound dressing changes 2 x for 2 week (i.e. the week pt. is NOT coming to the Wound Clinic) and the alternate week-Pt. will [facility] change wound dressings x1 that week. Wound dressings- Silver alginate,cover with gauze or ABD and Bilateral Kerlix and Coban to Lower Legs. Other Home Health Orders/Instructions: - [facility] Wound #30 Right,Distal,Posterior Lower Leg: No change in wound care orders this week; continue Home Health for wound care. May utilize formulary equivalent dressing for wound treatment orders unless otherwise specified. - Silver alginate to lower leg wounds, cover with ABD, Karla not use xeroform or Vaseline gauze over silver aginate. Wrap with kerlix and coban Poly mem with boarder foam dressing to gluteal wound New wound care orders this week; continue Home Health for wound care. May utilize formulary equivalent dressing for wound treatment orders unless otherwise specified. - Pt. will come to Wound Clinic every 3weeks-Please perform wound dressing changes 2 x for 2 week (i.e. the week pt. is NOT coming to the Wound Clinic) and the alternate week-Pt.  will [facility] an appointment [facility] change wound dressings x1 that week. Wound dressings- Silver alginate,cover with gauze or ABD and Bilateral Kerlix and Coban to Lower Legs. Other Home Health Orders/Instructions: - [facility] Wound #32 Left,Lateral Lower Leg: No change in wound care orders this week; continue Home Health for wound care. May utilize formulary equivalent dressing for wound treatment orders unless otherwise specified. - Silver alginate to lower leg wounds, cover with ABD, Karla not use xeroform or Vaseline gauze over silver aginate. Wrap with kerlix and coban Poly mem with boarder foam dressing to gluteal wound New wound care orders this week; continue Home Health for wound care. May utilize formulary equivalent dressing for wound treatment orders unless otherwise specified. - Pt. will come to Wound Clinic every 3weeks-Please perform wound dressing changes 2 x for 2 week (i.e. the week pt. is NOT coming to the Wound Clinic) and the alternate week-Pt. will have an appointment [facility] change wound dressings x1 that week. Wound dressings- Silver alginate,cover with gauze or ABD and Bilateral Kerlix and Coban to Lower Legs. Other Home Health Orders/Instructions: - [facility] Wound #34 Left,Proximal,Lateral Lower Leg: No change in wound care orders this week; continue Home Health for wound care. May utilize formulary equivalent dressing for wound treatment orders unless otherwise specified. - Silver alginate to lower leg wounds, cover with ABD, Karla not use xeroform or Vaseline gauze over silver aginate. Wrap with kerlix and coban Poly mem with boarder foam dressing to gluteal wound New wound care orders this week; continue Home Health for wound care. May utilize formulary equivalent dressing for wound treatment orders unless otherwise specified. - Pt. will come to Wound Clinic every 3weeks-Please perform wound dressing changes 2 x for 2  week (i.e. the week pt. is NOT coming to the Wound Clinic) and the alternate week-Pt. will have an appointment [facility] change wound dressings x1 that week. Wound dressings- Silver alginate,cover with gauze or ABD and Bilateral Kerlix and Coban to Lower Legs. Other Home Health Orders/Instructions: - [facility] Wound #35 Left,Distal,Lateral Lower Leg: No change in wound care orders this  week; continue Home Health for wound care. May utilize formulary equivalent dressing for wound treatment orders unless otherwise specified. - Silver alginate to lower leg wounds, cover with ABD, Karla not use xeroform or Vaseline gauze over silver aginate. Wrap with kerlix and coban Poly mem with boarder foam dressing to gluteal wound New wound care orders this week; continue Home Health for wound care. May utilize formulary equivalent dressing for wound treatment orders unless otherwise specified. - Pt. will come to Wound Clinic every 3weeks-Please perform wound dressing changes 2 x for 2 week (i.e. the week pt. is NOT coming to [name], [name] ([id number]) [id number] Page 15 of 17 the Wound Clinic) and the alternate week-Pt. will have an appointment [facility] change wound dressings x1 that week. Wound dressings- Silver alginate,cover with gauze or ABD and Bilateral Kerlix and Coban to Lower Legs. Other Home Health Orders/Instructions: - [facility] Wound #36 Left Gluteal fold: No change in wound care orders this week; continue Home Health for wound care. May utilize formulary equivalent dressing for wound treatment orders unless otherwise specified. - Silver alginate to lower leg wounds, cover with ABD, Karla not use xeroform or Vaseline gauze over silver aginate. Wrap with kerlix and coban Poly mem with boarder foam dressing to gluteal wound New wound care orders this week; continue Home Health for wound care. May utilize formulary  equivalent dressing for wound treatment orders unless otherwise specified. - Pt. will come to Wound Clinic every 3weeks-Please perform wound dressing changes 2 x for 2 week (i.e. the week pt. [facility] NOT [facility]) and the alternate week-[facility] change wound dressings x1 that week. Wound dressings- Silver alginate,cover with gauze or ABD and Bilateral Kerlix and Coban to Lower Legs. Other Home Health Orders/Instructions: - [facility] The following medication(s) was prescribed: lidocaine topical 4 % cream cream topical once daily was prescribed at facility WOUND #23: - Metatarsal head first Wound Laterality: Right Cleanser: Soap and Water 3 x Per Week/30 Days Discharge Instructions: May shower and wash wound with dial antibacterial soap and water prior to dressing change. Cleanser: Wound Cleanser 3 x Per Week/30 Days Discharge Instructions: Cleanse the wound with wound cleanser prior to applying a clean dressing using gauze sponges, not tissue or cotton balls. Peri-Wound Care: Sween Lotion (Moisturizing lotion) 3 x Per Week/30 Days Discharge Instructions: Apply moisturizing lotion as directed Prim Dressing: Maxorb Extra Ag+ Alginate Dressing, 2x2 (in/in) 3 x Per Week/30 Days ary Discharge Instructions: Apply to wound bed as instructed Secondary Dressing: ABD Pad, 8x10 3 x Per Week/30 Days Discharge Instructions: Apply over primary dressing as directed. Secured With: Coban Self-Adherent Wrap 4x5 (in/yd) 3 x Per Week/30 Days Discharge Instructions: Secure with Coban as directed. Secured With: [employer], 4.5x3.1 (in/yd) 3 x Per Week/30 Days Discharge Instructions: Secure with Kerlix as directed. Secured With: 20M Medipore H Soft Cloth Surgical T ape, 4 x 10 (in/yd) 3 x Per Week/30 Days Discharge Instructions: Secure with tape as directed. WOUND #29: - Lower Leg Wound Laterality: Right, Anterior Cleanser: Soap and  Water 3 x Per Week/30 Days Discharge Instructions: May shower and wash wound with dial antibacterial soap and water prior to dressing change. Cleanser: Wound Cleanser 3 x Per Week/30 Days Discharge Instructions: Cleanse the wound with wound cleanser prior to applying a clean dressing using gauze sponges, not tissue or cotton balls. Peri-Wound Care: Sween Lotion (Moisturizing lotion)  3 x Per Week/30 Days Discharge Instructions: Apply moisturizing lotion as directed Prim Dressing: Maxorb Extra Ag+ Alginate Dressing, 2x2 (in/in) 3 x Per Week/30 Days ary Discharge Instructions: Apply to wound bed as instructed Secondary Dressing: ABD Pad, 8x10 3 x Per Week/30 Days Discharge Instructions: Apply over primary dressing as directed. Secured With: Coban Self-Adherent Wrap 4x5 (in/yd) 3 x Per Week/30 Days Discharge Instructions: Secure with Coban as directed. Secured With: [employer], 4.5x3.1 (in/yd) 3 x Per Week/30 Days Discharge Instructions: Secure with Kerlix as directed. Secured With: 32M Medipore H Soft Cloth Surgical T ape, 4 x 10 (in/yd) 3 x Per Week/30 Days Discharge Instructions: Secure with tape as directed. WOUND #30: - Lower Leg Wound Laterality: Right, Posterior, Distal Cleanser: Soap and Water 3 x Per Week/30 Days Discharge Instructions: May shower and wash wound with dial antibacterial soap and water prior to dressing change. Cleanser: Wound Cleanser 3 x Per Week/30 Days Discharge Instructions: Cleanse the wound with wound cleanser prior to applying a clean dressing using gauze sponges, not tissue or cotton balls. Peri-Wound Care: Sween Lotion (Moisturizing lotion) 3 x Per Week/30 Days Discharge Instructions: Apply moisturizing lotion as directed Prim Dressing: Maxorb Extra Ag+ Alginate Dressing, 2x2 (in/in) 3 x Per Week/30 Days ary Discharge Instructions: Apply to wound bed as instructed Secondary Dressing: ABD Pad, 8x10 3 x Per Week/30 Days Discharge Instructions: Apply  over primary dressing as directed. Secured With: Coban Self-Adherent Wrap 4x5 (in/yd) 3 x Per Week/30 Days Discharge Instructions: Secure with Coban as directed. Secured With: [employer], 4.5x3.1 (in/yd) 3 x Per Week/30 Days Discharge Instructions: Secure with Kerlix as directed. Secured With: 32M Medipore H Soft Cloth Surgical T ape, 4 x 10 (in/yd) 3 x Per Week/30 Days Discharge Instructions: Secure with tape as directed. WOUND #32: - Lower Leg Wound Laterality: Left, Lateral Cleanser: Soap and Water 3 x Per Week/30 Days Discharge Instructions: May shower and wash wound with dial antibacterial soap and water prior to dressing change. Cleanser: Wound Cleanser 3 x Per Week/30 Days Discharge Instructions: Cleanse the wound with wound cleanser prior to applying a clean dressing using gauze sponges, not tissue or cotton balls. Peri-Wound Care: Sween Lotion (Moisturizing lotion) 3 x Per Week/30 Days Discharge Instructions: Apply moisturizing lotion as directed Prim Dressing: Maxorb Extra Ag+ Alginate Dressing, 2x2 (in/in) 3 x Per Week/30 Days ary Discharge Instructions: Apply to wound bed as instructed Secondary Dressing: ABD Pad, 8x10 3 x Per Week/30 Days Discharge Instructions: Apply over primary dressing as directed. Secured With: Coban Self-Adherent Wrap 4x5 (in/yd) 3 x Per Week/30 Days Discharge Instructions: Secure with Coban as directed. Secured With: [employer], 4.5x3.1 (in/yd) 3 x Per Week/30 Days Discharge Instructions: Secure with Kerlix as directed. Secured With: 32M Medipore H Soft Cloth Surgical T ape, 4 x 10 (in/yd) 3 x Per Week/30 Days Discharge Instructions: Secure with tape as directed. WOUND #34: - Lower Leg Wound Laterality: Left, Lateral, Proximal Cleanser: Soap and Water 3 x Per Week/30 Days Discharge Instructions: May shower and wash wound with dial antibacterial soap and water prior to dressing change. Cleanser: Wound Cleanser 3 x Per Week/30  Days Discharge Instructions: Cleanse the wound with wound cleanser prior to applying a clean dressing using gauze sponges, not tissue or cotton balls. Peri-Wound Care: Sween Lotion (Moisturizing lotion) 3 x Per Week/30 Days Discharge Instructions: Apply moisturizing lotion as directed Prim Dressing: Maxorb Extra Ag+ Alginate Dressing, 2x2 (in/in) 3 x Per Week/30 Days ary Discharge Instructions: Apply to wound  bed as instructed Secondary Dressing: ABD Pad, 8x10 3 x Per Week/30 Days [name], [name] ([id number]) [phone].pdf Page 16 of 17 Discharge Instructions: Apply over primary dressing as directed. Secured With: Coban Self-Adherent Wrap 4x5 (in/yd) 3 x Per Week/30 Days Discharge Instructions: Secure with Coban as directed. Secured With: [employer], 4.5x3.1 (in/yd) 3 x Per Week/30 Days Discharge Instructions: Secure with Kerlix as directed. Secured With: 12M Medipore H Soft Cloth Surgical T ape, 4 x 10 (in/yd) 3 x Per Week/30 Days Discharge Instructions: Secure with tape as directed. WOUND #35: - Lower Leg Wound Laterality: Left, Lateral, Distal Cleanser: Soap and Water 3 x Per Week/30 Days Discharge Instructions: May shower and wash wound with dial antibacterial soap and water prior to dressing change. Cleanser: Wound Cleanser 3 x Per Week/30 Days Discharge Instructions: Cleanse the wound with wound cleanser prior to applying a clean dressing using gauze sponges, not tissue or cotton balls. Peri-Wound Care: Sween Lotion (Moisturizing lotion) 3 x Per Week/30 Days Discharge Instructions: Apply moisturizing lotion as directed Prim Dressing: Maxorb Extra Ag+ Alginate Dressing, 2x2 (in/in) 3 x Per Week/30 Days ary Discharge Instructions: Apply to wound bed as instructed Secondary Dressing: ABD Pad, 8x10 3 x Per Week/30 Days Discharge Instructions: Apply over primary dressing as directed. Secured With: Coban Self-Adherent Wrap 4x5 (in/yd) 3 x Per Week/30  Days Discharge Instructions: Secure with Coban as directed. Secured With: [employer], 4.5x3.1 (in/yd) 3 x Per Week/30 Days Discharge Instructions: Secure with Kerlix as directed. Secured With: 12M Medipore H Soft Cloth Surgical T ape, 4 x 10 (in/yd) 3 x Per Week/30 Days Discharge Instructions: Secure with tape as directed. WOUND #36: - Gluteal fold Wound Laterality: Left Cleanser: Soap and Water Discharge Instructions: May shower and wash wound with dial antibacterial soap and water prior to dressing change. Cleanser: Vashe 5.8 (oz) Discharge Instructions: Cleanse the wound with Vashe prior to applying a clean dressing using gauze sponges, not tissue or cotton balls. Prim Dressing: PolyMem Non-Adhesive Dressing, 4x4 in ary Discharge Instructions: Apply to wound bed as instructed Secondary Dressing: ALLEVYN Gentle Border, 5x5 (in/in) Discharge Instructions: Apply over primary dressing as directed. 1. No change this week including silver alginate ABDs under kerlix Coban 2. She has home health. We will see her back in 2 weeks 3. I was glad to see that the amount of edema in her legs had gone down. Electronic Signature(s) Signed: 10/30/2023 5:10:17 PM By: Shawn Stall RN, BSN Signed: 10/31/2023 4:32:13 PM By: Baltazar Najjar MD Previous Signature: 10/26/2023 5:17:16 PM Version By: Baltazar Najjar MD Entered By: Shawn Stall on 10/30/2023 14:07:57 -------------------------------------------------------------------------------- SuperBill Details Patient Name: Date of Service: Karla Bigness, Karla [name] 10/26/2023 Medical Record Number: [id number] Patient Account Number: [id number] Date of Birth/Sex: Treating RN: [birth year] (62 y.o. 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.522 Non-pressure chronic ulcer of other part of left foot with fat layer exposed E11.622 Type 2 diabetes mellitus with other skin ulcer L97.812 Non-pressure chronic ulcer of other part of right lower leg with fat layer exposed I73.89 Other specified peripheral vascular diseases I87.331 Chronic venous hypertension (idiopathic) with ulcer and inflammation of right lower extremity L97.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, stage 2 SHYN[employer], [employer] ([id number]) [id number] Page 17 of 17 Facility Procedures : CPT4 Code: [id number] Description: [phone] - WOUND CARE VISIT-LEV 5 EST PT Modifier: Quantity: 1 Physician Procedures : CPT4 Code Description Modifier [id number] - WC PHYS LEVEL 3 - EST PT ICD-10 Diagnosis Description L97.812 Non-pressure chronic ulcer of other part of right lower leg with fat layer exposed L97.828 Non-pressure chronic ulcer of other part of left  lower leg with other specified severity I87.331 Chronic venous hypertension (idiopathic) with ulcer and inflammation of right lower extremity I73.89 Other specified peripheral vascular diseases Quantity: 1 Electronic Signature(s) Signed: 11/02/2023 12:25:13 PM By: Pearletha Alfred Signed: 11/07/2023 11:51:18 AM By: Baltazar Najjar MD Previous Signature: 10/26/2023 5:17:16 PM Version By: Baltazar Najjar MD Entered By: Pearletha Alfred on 11/02/2023 09:25:13

## 2023-10-27 NOTE — Progress Notes (Signed)
ALASIAH, CAMPODONICO ([id number]) [id number] Page 1 of 13 Visit Report for 10/26/2023 Arrival Information Details Patient Name: Date of Service: Maryla Morrow 10/26/2023 12:30 PM Medical Record Number: [id number] Patient Account Number: [id number] Date of Birth/Sex: Treating RN: [birth year] (62 y.o. [name] Primary Care Tana Trefry: [name] Other Clinician: Referring Maeryn Mcgath: Treating Zackari Ruane/Extender: Leotis Pain in Treatment: 27 Visit Information History Since Last Visit Added or deleted any medications: No Patient Arrived: Wheel Chair Any new allergies or adverse reactions: No Arrival Time: 12:50 Had a fall or experienced change in No Accompanied By: self activities of daily living that may affect Transfer Assistance: None risk of falls: Patient Identification Verified: Yes Signs or symptoms of abuse/neglect since last visito No Secondary Verification Process Completed: Yes Hospitalized since last visit: No Patient Requires Transmission-Based Precautions: No Implantable device outside of the clinic excluding No Patient Has Alerts: Yes cellular tissue based products placed in the center Patient Alerts: ABI R 0.57 (01/11/23) since last visit: ABI L 1.04 (01/11/23) Has Dressing in Place as Prescribed: Yes Has Compression in Place as Prescribed: Yes Pain Present Now: Yes Electronic Signature(s) Signed: 10/26/2023 4:57:37 PM By: Redmond Pulling RN, BSN Entered By: Redmond Pulling on 10/26/2023 12:51:01 -------------------------------------------------------------------------------- Lower Extremity Assessment Details Patient Name: Date of Service: [name], [name] 10/26/2023 12:30 PM Medical Record Number: [id number] Patient Account Number: [id number] Date of Birth/Sex: Treating RN: 04/23/1961 (62 y.o. [name] Primary Care Lequisha Cammack: [name] Other Clinician: Referring Thecla Forgione: Treating  Jynesis Nakamura/Extender: Josephine Cables Weeks in Treatment: 27 Edema Assessment Assessed: [Left: No] [Right: No] Edema: [Left: Yes] [Right: Yes] Calf Left: Right: Point of Measurement: 30 cm From Medial Instep 40.2 cm 39.5 cm Ankle Left: Right: Point of Measurement: 9 cm From Medial Instep 21.1 cm 20.4 cm Vascular Assessment Pulses: Dorsalis Pedis Palpable: [Left:Yes] [Right:No] DAMYRIA, HOOSIER S ([id number]) [Right:[id number] Page 2 of 13] Extremity colors, hair growth, and conditions: Extremity Color: [Left:Hyperpigmented] [Right:Hyperpigmented] Hair Growth on Extremity: [Left:No] [Right:No] Temperature of Extremity: [Left:Warm] [Right:Warm] Capillary Refill: [Left:< 3 seconds] [Right:< 3 seconds] Dependent Rubor: [Left:No Yes] [Right:No Yes] Electronic Signature(s) Signed: 10/26/2023 4:57:37 PM By: Redmond Pulling RN, BSN Entered By: Redmond Pulling on 10/26/2023 13:15:11 -------------------------------------------------------------------------------- Multi-Disciplinary Care Plan Details Patient Name: Date of Service: [name], [name] 10/26/2023 12:30 PM Medical Record Number: [id number] Patient Account Number: [id number] Date of Birth/Sex: Treating RN: Jan 05, 1961 (62 y.o. [name] Primary Care Moishe Schellenberg: [name] Other Clinician: Referring Didier Brandenburg: Treating Martie Muhlbauer/Extender: Josephine Cables Weeks in Treatment: 27 Multidisciplinary Care Plan reviewed with physician Active Inactive Wound/Skin Impairment Nursing Diagnoses: Impaired tissue integrity Goals: Patient/caregiver will verbalize understanding of skin care regimen Date Initiated: 04/19/2023 Target Resolution Date: 11/16/2023 Goal Status: Active Interventions: Assess ulceration(s) every visit Treatment Activities: Skin care regimen initiated : 04/19/2023 Notes: Electronic Signature(s) Signed: 10/26/2023 4:57:37 PM By: Redmond Pulling RN,  BSN Entered By: Redmond Pulling on 10/26/2023 13:41:54 -------------------------------------------------------------------------------- Pain Assessment Details Patient Name: Date of Service: [name], [name] 10/26/2023 12:30 PM Medical Record Number: [id number] Patient Account Number: [id number] Date of Birth/Sex: Treating RN: [birth year] (62 y.o. [name] Primary Care Jaden Batchelder: [name] Other Clinician: Referring Haelyn Forgey: Treating Shandale Malak/Extender: Leotis Pain in Treatment: [address] ANTONIETA, PARAMO ([id number]) [id number] Page 3 of 13 Location of Pain Severity and Description of Pain Patient Has Paino Yes Site Locations Rate the pain. Current Pain Level: 8 Pain Management and Medication Current Pain Management:  Electronic Signature(s) Signed: 10/26/2023 4:57:37 PM By: Redmond Pulling RN, BSN Entered By: Redmond Pulling on 10/26/2023 12:51:36 -------------------------------------------------------------------------------- Patient/Caregiver Education Details Patient Name: Date of Service: Maryla Morrow 12/12/2024andnbsp12:30 PM Medical Record Number: [id number] Patient Account Number: [id number] Date of Birth/Gender: Treating RN: 11-28-1960 (62 y.o. [name] Primary Care Physician: [name] Other Clinician: Referring Physician: Treating Physician/Extender: Leotis Pain in Treatment: 27 Education Assessment Education Provided To: Patient Education Topics Provided Wound/Skin Impairment: Methods: Explain/Verbal Responses: State content correctly [employer]) Signed: 10/26/2023 4:57:37 PM By: Redmond Pulling RN, BSN Entered By: Redmond Pulling on 10/26/2023 13:42:10 -------------------------------------------------------------------------------- Wound Assessment Details Patient Name: Date of Service: [name], [name] 10/26/2023 12:30 PM [name] ([id number]) [id number] Page 4 of 13 Medical Record Number: [id number] Patient Account Number: [id number] Date of Birth/Sex: Treating RN: 1961-09-19 (62 y.o. [name] Primary Care Katora Fini: [name] Other Clinician: Referring Teren Zurcher: Treating Seba Madole/Extender: Josephine Cables Weeks in Treatment: 27 Wound Status Wound Number: 23 Primary Diabetic Wound/Ulcer of the Lower Extremity Etiology: Wound Location: Right Metatarsal head first Wound Open Wounding Event: Gradually Appeared Status: Date Acquired: 04/18/2021 Comorbid Chronic Obstructive Pulmonary Disease (COPD), Sleep Apnea, Weeks Of Treatment: 27 History: Arrhythmia, Congestive Heart Failure, Coronary Artery Disease, Clustered Wound: No Hypertension, Peripheral Arterial Disease, Cirrhosis , Type II Diabetes, Osteoarthritis, Neuropathy Photos Wound Measurements Length: (cm) 0.4 Width: (cm) 0.4 Depth: (cm) 0.2 Area: (cm) 0.126 Volume: (cm) 0.025 % Reduction in Area: 86.6% % Reduction in Volume: 94.7% Epithelialization: None Tunneling: No Undermining: Yes Starting Position (o'clock): 12 Ending Position (o'clock): 12 Maximum Distance: (cm) 0.2 Wound Description Classification: Grade 2 Wound Margin: Distinct, outline attached Exudate Amount: Medium Exudate Type: Serosanguineous Exudate Color: red, brown Foul Odor After Cleansing: No Slough/Fibrino Yes Wound Bed Granulation Amount: Large (67-100%) Exposed Structure Granulation Quality: Red, Pale Fascia Exposed: No Necrotic Amount: Small (1-33%) Fat Layer (Subcutaneous Tissue) Exposed: Yes Necrotic Quality: Adherent Slough Tendon Exposed: No Muscle Exposed: No Joint Exposed: No Bone Exposed: No Periwound Skin Texture Texture Color No Abnormalities Noted: No No Abnormalities Noted: Yes Callus: Yes Temperature / Pain Temperature: No Abnormality Moisture No Abnormalities Noted: No Dry / Scaly:  No Maceration: No Electronic Signature(s) Signed: 10/26/2023 4:57:37 PM By: Redmond Pulling RN, BSN Entered By: Redmond Pulling on 10/26/2023 13:33:53 [name] ([id number]) [id number] Page 5 of 13 -------------------------------------------------------------------------------- Wound Assessment Details Patient Name: Date of Service: Maryla Morrow 10/26/2023 12:30 PM Medical Record Number: [id number] Patient Account Number: [id number] Date of Birth/Sex: Treating RN: 04/03/1961 (62 y.o. [name] Primary Care Teng Decou: [name] Other Clinician: Referring Noam Karaffa: Treating Haidyn Chadderdon/Extender: Josephine Cables Weeks in Treatment: 27 Wound Status Wound Number: 29 Primary Trauma, Other Etiology: Wound Location: Right, Anterior Lower Leg Wound Open Wounding Event: Bump Status: Date Acquired: 08/20/2023 Comorbid Chronic Obstructive Pulmonary Disease (COPD), Sleep Apnea, Weeks Of Treatment: 9 History: Arrhythmia, Congestive Heart Failure, Coronary Artery Disease, Clustered Wound: Yes Hypertension, Peripheral Arterial Disease, Cirrhosis , Type II Diabetes, Osteoarthritis, Neuropathy Photos Wound Measurements Length: (cm) Width: (cm) Depth: (cm) Clustered Quantity: Area: (cm) Volume: (cm) 2.8 % Reduction in Area: -590.7% 3.7 % Reduction in Volume: -589.8% 0.1 Epithelialization: None 3 Tunneling: No 8.137 Undermining: No 0.814 Wound Description Classification: Full Thickness Without Exposed Sup Wound Margin: Distinct, outline attached Exudate Amount: Medium Exudate Type: Serosanguineous Exudate Color: red, brown port Structures Foul Odor After Cleansing: No Slough/Fibrino Yes Wound Bed Granulation Amount: Large (67-100%) Exposed Structure Granulation  Quality: Red Fat Layer (Subcutaneous Tissue) Exposed: Yes Necrotic Amount: Small (1-33%) Necrotic Quality: Adherent Slough Periwound Skin Texture Texture  Color No Abnormalities Noted: No No Abnormalities Noted: No Callus: No Atrophie Blanche: No Crepitus: No Cyanosis: No Excoriation: No Ecchymosis: No Induration: No Erythema: No Rash: No Hemosiderin Staining: Yes Scarring: No Mottled: No Pallor: No Moisture Rubor: No No Abnormalities Noted: No Dry / Scaly: No Temperature / Pain Maceration: No Temperature: No Abnormality JOURDAN, PASTERNAK ([id number]) [id number] Page 6 of 13 Electronic Signature(s) Signed: 10/26/2023 4:57:37 PM By: Redmond Pulling RN, BSN Entered By: Redmond Pulling on 10/26/2023 13:35:03 -------------------------------------------------------------------------------- Wound Assessment Details Patient Name: Date of Service: [name], [name] 10/26/2023 12:30 PM Medical Record Number: [id number] Patient Account Number: [id number] Date of Birth/Sex: Treating RN: [birth year] (62 y.o. [name] Primary Care Bowden Boody: [name] Other Clinician: Referring Karla Vines: Treating Baldo Hufnagle/Extender: Josephine Cables Weeks in Treatment: 27 Wound Status Wound Number: 30 Primary Venous Leg Ulcer Etiology: Wound Location: Right, Distal, Posterior Lower Leg Wound Open Wounding Event: Gradually Appeared Status: Date Acquired: 09/06/2023 Comorbid Chronic Obstructive Pulmonary Disease (COPD), Sleep Apnea, Weeks Of Treatment: 7 History: Arrhythmia, Congestive Heart Failure, Coronary Artery Disease, Clustered Wound: No Hypertension, Peripheral Arterial Disease, Cirrhosis , Type II Diabetes, Osteoarthritis, Neuropathy Photos Wound Measurements Length: (cm) 0.2 Width: (cm) 0.2 Depth: (cm) 0.1 Area: (cm) 0.031 Volume: (cm) 0.003 % Reduction in Area: 73.7% % Reduction in Volume: 75% Epithelialization: Small (1-33%) Tunneling: No Undermining: No Wound Description Classification: Full Thickness Without Exposed Suppor Wound Margin: Distinct, outline attached Exudate  Amount: Medium Exudate Type: Serosanguineous Exudate Color: red, brown t Structures Foul Odor After Cleansing: No Slough/Fibrino Yes Wound Bed Granulation Amount: Large (67-100%) Exposed Structure Granulation Quality: Red, Pink Fascia Exposed: No Necrotic Amount: Small (1-33%) Fat Layer (Subcutaneous Tissue) Exposed: Yes Necrotic Quality: Adherent Slough Tendon Exposed: No Muscle Exposed: No Joint Exposed: No Bone Exposed: No Periwound Skin Texture Texture Color No Abnormalities Noted: No No Abnormalities Noted: No Callus: No Atrophie Blanche: No Crepitus: No Cyanosis: No Excoriation: No Ecchymosis: No SURVEEN, MONSIVAIS ([id number]) [id number] Page 7 of 13 Induration: No Erythema: No Rash: No Hemosiderin Staining: Yes Scarring: No Mottled: No Pallor: No Moisture Rubor: No No Abnormalities Noted: No Dry / Scaly: No Temperature / Pain Maceration: No Temperature: No Abnormality Electronic Signature(s) Signed: 10/26/2023 4:57:37 PM By: Redmond Pulling RN, BSN Entered By: Redmond Pulling on 10/26/2023 13:35:29 -------------------------------------------------------------------------------- Wound Assessment Details Patient Name: Date of Service: [name], [name] 10/26/2023 12:30 PM Medical Record Number: [id number] Patient Account Number: [id number] Date of Birth/Sex: Treating RN: 1960/12/02 (62 y.o. [name] Primary Care Tyreik Delahoussaye: [name] Other Clinician: Referring Dent Plantz: Treating Ericah Scotto/Extender: Josephine Cables Weeks in Treatment: 27 Wound Status Wound Number: 31 Primary Venous Leg Ulcer Etiology: Wound Location: Right, Proximal, Posterior Lower Leg Wound Healed - Epithelialized Wounding Event: Blister Status: Date Acquired: 10/04/2023 Comorbid Chronic Obstructive Pulmonary Disease (COPD), Sleep Apnea, Weeks Of Treatment: 3 History: Arrhythmia, Congestive Heart Failure, Coronary Artery  Disease, Clustered Wound: Yes Hypertension, Peripheral Arterial Disease, Cirrhosis , Type II Diabetes, Osteoarthritis, Neuropathy Wound Measurements Length: (cm) Width: (cm) Depth: (cm) Area: (cm) Volume: (cm) 0 % Reduction in Area: 100% 0 % Reduction in Volume: 100% 0 Epithelialization: Large (67-100%) 0 Tunneling: No 0 Undermining: No Wound Description Classification: Full Thickness Without Exposed Support Structures Wound Margin: Flat and Intact Exudate Amount: None Present Foul Odor After Cleansing: No Slough/Fibrino No Wound Bed Granulation Amount: None Present (0%)  Exposed Structure Necrotic Amount: None Present (0%) Fascia Exposed: No Fat Layer (Subcutaneous Tissue) Exposed: No Tendon Exposed: No Muscle Exposed: No Joint Exposed: No Bone Exposed: No Periwound Skin Texture Texture Color No Abnormalities Noted: Yes No Abnormalities Noted: Yes Moisture Temperature / Pain No Abnormalities Noted: Yes Temperature: No Abnormality Electronic Signature(s) Signed: 10/26/2023 4:57:37 PM By: Redmond Pulling RN, BSN Entered By: Redmond Pulling on 10/26/2023 13:19:49 [name] ([id number]) [id number] Page 8 of 13 -------------------------------------------------------------------------------- Wound Assessment Details Patient Name: Date of Service: Maryla Morrow 10/26/2023 12:30 PM Medical Record Number: [id number] Patient Account Number: [id number] Date of Birth/Sex: Treating RN: 08/26/1961 (62 y.o. [name] Primary Care Divine Hansley: [name] Other Clinician: Referring Nikko Quast: Treating Kyel Purk/Extender: Josephine Cables Weeks in Treatment: 27 Wound Status Wound Number: 32 Primary Venous Leg Ulcer Etiology: Wound Location: Left, Lateral Lower Leg Wound Open Wounding Event: Blister Status: Date Acquired: 10/04/2023 Comorbid Chronic Obstructive Pulmonary Disease (COPD), Sleep Apnea, Weeks Of Treatment:  3 History: Arrhythmia, Congestive Heart Failure, Coronary Artery Disease, Clustered Wound: No Hypertension, Peripheral Arterial Disease, Cirrhosis , Type II Diabetes, Osteoarthritis, Neuropathy Photos Wound Measurements Length: (cm) 1.3 Width: (cm) 0.8 Depth: (cm) 0.1 Area: (cm) 0.817 Volume: (cm) 0.082 % Reduction in Area: 69.4% % Reduction in Volume: 69.3% Epithelialization: Small (1-33%) Tunneling: No Undermining: No Wound Description Classification: Full Thickness Without Exposed Suppor Exudate Amount: Medium Exudate Type: Serosanguineous Exudate Color: red, brown t Structures Foul Odor After Cleansing: No Slough/Fibrino Yes Wound Bed Granulation Amount: Large (67-100%) Exposed Structure Granulation Quality: Pink Fascia Exposed: No Necrotic Amount: Small (1-33%) Fat Layer (Subcutaneous Tissue) Exposed: Yes Necrotic Quality: Adherent Slough Tendon Exposed: No Muscle Exposed: No Joint Exposed: No Bone Exposed: No Periwound Skin Texture Texture Color No Abnormalities Noted: Yes No Abnormalities Noted: No Hemosiderin Staining: Yes Moisture No Abnormalities Noted: Yes Temperature / Pain Temperature: No Abnormality Electronic Signature(s) Signed: 10/26/2023 4:57:37 PM By: Redmond Pulling RN, BSN Entered By: Redmond Pulling on 10/26/2023 13:36:28 [name] ([id number]) [id number] Page 9 of 13 -------------------------------------------------------------------------------- Wound Assessment Details Patient Name: Date of Service: Maryla Morrow 10/26/2023 12:30 PM Medical Record Number: [id number] Patient Account Number: [id number] Date of Birth/Sex: Treating RN: 1961/05/04 (62 y.o. [name] Primary Care Maurizio Geno: [name] Other Clinician: Referring Fred Franzen: Treating Eiza Canniff/Extender: Josephine Cables Weeks in Treatment: 27 Wound Status Wound Number: 33 Primary Venous Leg Ulcer Etiology: Wound  Location: Left, Posterior Lower Leg Wound Open Wounding Event: Blister Status: Date Acquired: 10/04/2023 Comorbid Chronic Obstructive Pulmonary Disease (COPD), Sleep Apnea, Weeks Of Treatment: 3 History: Arrhythmia, Congestive Heart Failure, Coronary Artery Disease, Clustered Wound: No Hypertension, Peripheral Arterial Disease, Cirrhosis , Type II Diabetes, Osteoarthritis, Neuropathy Wound Measurements Length: (cm) Width: (cm) Depth: (cm) Area: (cm) Volume: (cm) 0 % Reduction in Area: 100% 0 % Reduction in Volume: 100% 0 Epithelialization: Large (67-100%) 0 Tunneling: No 0 Undermining: No Wound Description Classification: Full Thickness Without Exposed Support Structures Wound Margin: Flat and Intact Exudate Amount: None Present Foul Odor After Cleansing: No Slough/Fibrino No Wound Bed Granulation Amount: None Present (0%) Exposed Structure Necrotic Amount: None Present (0%) Fascia Exposed: No Fat Layer (Subcutaneous Tissue) Exposed: No Tendon Exposed: No Muscle Exposed: No Joint Exposed: No Bone Exposed: No Periwound Skin Texture Texture Color No Abnormalities Noted: Yes No Abnormalities Noted: No Hemosiderin Staining: Yes Moisture No Abnormalities Noted: Yes Temperature / Pain Temperature: No Abnormality Electronic Signature(s) Signed: 10/26/2023 4:57:37 PM By: Redmond Pulling RN, BSN Entered By: Rubye Oaks,  Carrie on 10/26/2023 13:22:25 -------------------------------------------------------------------------------- Wound Assessment Details Patient Name: Date of Service: Maryla Morrow 10/26/2023 12:30 PM Medical Record Number: [id number] Patient Account Number: [id number] Date of Birth/Sex: Treating RN: November 05, 1961 (62 y.o. [name] Primary Care Martinique Pizzimenti: [name] Other Clinician: Referring Zyquan Crotty: Treating Cheyne Bungert/Extender: Leotis Pain in Treatment: [address], [place] ([id number])  [id number] Page 10 of 13 Wound Status Wound Number: 34 Primary Diabetic Wound/Ulcer of the Lower Extremity Etiology: Wound Location: Left, Proximal, Lateral Lower Leg Secondary Venous Leg Ulcer Wounding Event: Gradually Appeared Etiology: Date Acquired: 10/19/2023 Wound Open Weeks Of Treatment: 1 Status: Clustered Wound: No Comorbid Chronic Obstructive Pulmonary Disease (COPD), Sleep Apnea, History: Arrhythmia, Congestive Heart Failure, Coronary Artery Disease, Hypertension, Peripheral Arterial Disease, Cirrhosis , Type II Diabetes, Osteoarthritis, Neuropathy Photos Wound Measurements Length: (cm) 2 Width: (cm) 2 Depth: (cm) 0.1 Area: (cm) 3.142 Volume: (cm) 0.314 % Reduction in Area: 9.1% % Reduction in Volume: 9.2% Epithelialization: Small (1-33%) Wound Description Classification: Grade 1 Wound Margin: Flat and Intact Exudate Amount: Medium Exudate Type: Serous Exudate Color: amber Foul Odor After Cleansing: No Slough/Fibrino No Wound Bed Granulation Amount: Large (67-100%) Exposed Structure Granulation Quality: Red Fascia Exposed: No Necrotic Amount: Small (1-33%) Fat Layer (Subcutaneous Tissue) Exposed: Yes Necrotic Quality: Adherent Slough Tendon Exposed: No Muscle Exposed: No Joint Exposed: No Bone Exposed: No Periwound Skin Texture Texture Color No Abnormalities Noted: Yes No Abnormalities Noted: Yes Moisture Temperature / Pain No Abnormalities Noted: Yes Temperature: No Abnormality Tenderness on Palpation: Yes Electronic Signature(s) Signed: 10/26/2023 4:57:37 PM By: Redmond Pulling RN, BSN Entered By: Redmond Pulling on 10/26/2023 13:37:03 -------------------------------------------------------------------------------- Wound Assessment Details Patient Name: Date of Service: [name], [name] 10/26/2023 12:30 PM Medical Record Number: [id number] Patient Account Number: [id number] Date of Birth/Sex: Treating RN: [birth year]  (62 y.o. [address] [place], [place] ([id number]) [id number] Page 11 of 13 Primary Care Dequincy Born: [name] Other Clinician: Referring Monia Timmers: Treating Jatoria Kneeland/Extender: Josephine Cables Weeks in Treatment: 27 Wound Status Wound Number: 35 Primary Venous Leg Ulcer Etiology: Wound Location: Left, Distal, Lateral Lower Leg Wound Open Wounding Event: Gradually Appeared Status: Date Acquired: 10/26/2023 Comorbid Chronic Obstructive Pulmonary Disease (COPD), Sleep Apnea, Weeks Of Treatment: 0 History: Arrhythmia, Congestive Heart Failure, Coronary Artery Disease, Clustered Wound: No Hypertension, Peripheral Arterial Disease, Cirrhosis , Type II Diabetes, Osteoarthritis, Neuropathy Photos Wound Measurements Length: (cm) 3.2 Width: (cm) 1.5 Depth: (cm) 0.1 Area: (cm) 3.77 Volume: (cm) 0.377 % Reduction in Area: % Reduction in Volume: Epithelialization: None Tunneling: No Undermining: No Wound Description Classification: Full Thickness Without Exposed Suppor Exudate Amount: Medium Exudate Type: Serosanguineous Exudate Color: red, brown t Structures Foul Odor After Cleansing: No Slough/Fibrino Yes Wound Bed Granulation Amount: Large (67-100%) Exposed Structure Granulation Quality: Pink Fascia Exposed: No Necrotic Amount: Small (1-33%) Fat Layer (Subcutaneous Tissue) Exposed: Yes Necrotic Quality: Adherent Slough Tendon Exposed: No Muscle Exposed: No Joint Exposed: No Bone Exposed: No Periwound Skin Texture Texture Color No Abnormalities Noted: No No Abnormalities Noted: No Moisture No Abnormalities Noted: No Electronic Signature(s) Signed: 10/26/2023 4:57:37 PM By: Redmond Pulling RN, BSN Entered By: Redmond Pulling on 10/26/2023 13:37:30 -------------------------------------------------------------------------------- Wound Assessment Details Patient Name: Date of Service: [name], [name] 10/26/2023 12:30  PM Medical Record Number: [id number] Patient Account Number: [id number] Date of Birth/Sex: Treating RN: June 09, 1961 (62 y.o. [address], [address] [place] ([id number]) [id number] Page 12 of 13 Primary Care Dashley Monts: [name] Other Clinician: Referring Shavawn Stobaugh: Treating Magdaline Zollars/Extender: Leanord Hawking  Gweneth Fritter, [name] Weeks in Treatment: 27 Wound Status Wound Number: 36 Primary Pressure Ulcer Etiology: Wound Location: Left Gluteal fold Wound Open Wounding Event: Pressure Injury Status: Date Acquired: 10/26/2023 Comorbid Chronic Obstructive Pulmonary Disease (COPD), Sleep Apnea, Weeks Of Treatment: 0 History: Arrhythmia, Congestive Heart Failure, Coronary Artery Disease, Clustered Wound: No Hypertension, Peripheral Arterial Disease, Cirrhosis , Type II Diabetes, Osteoarthritis, Neuropathy Photos Wound Measurements Length: (cm) 0.5 Width: (cm) 0.5 Depth: (cm) 0.1 Area: (cm) 0.196 Volume: (cm) 0.02 % Reduction in Area: % Reduction in Volume: Epithelialization: None Tunneling: No Undermining: No Wound Description Classification: Category/Stage II Exudate Amount: Medium Exudate Type: Serosanguineous Exudate Color: red, brown Foul Odor After Cleansing: No Slough/Fibrino Yes Wound Bed Granulation Amount: Large (67-100%) Exposed Structure Granulation Quality: Red Fascia Exposed: No Necrotic Amount: Small (1-33%) Fat Layer (Subcutaneous Tissue) Exposed: Yes Necrotic Quality: Adherent Slough Tendon Exposed: No Muscle Exposed: No Joint Exposed: No Bone Exposed: No Periwound Skin Texture Texture Color No Abnormalities Noted: No No Abnormalities Noted: No Moisture No Abnormalities Noted: No Electronic Signature(s) Signed: 10/26/2023 4:57:37 PM By: Redmond Pulling RN, BSN Entered By: Redmond Pulling on 10/26/2023 13:34:17 -------------------------------------------------------------------------------- Vitals Details Patient Name:  Date of Service: [name], [name] 10/26/2023 12:30 PM Medical Record Number: [id number] Patient Account Number: [id number] Date of Birth/Sex: Treating RN: [birth year] (62 y[address]o. [address] [place], [employer] ([id number]) [id number] Page 13 of 13 Primary Care Garrin Kirwan: [name] Other Clinician: Referring Kaydence Baba: Treating Amadeo Coke/Extender: Josephine Cables Weeks in Treatment: 27 Vital Signs Time Taken: 12:51 Temperature (F): 98.3 Height (in): 64 Pulse (bpm): 91 Weight (lbs): 225 Respiratory Rate (breaths/min): 18 Body Mass Index (BMI): 38.6 Blood Pressure (mmHg): 150/75 Capillary Blood Glucose (mg/dl): 98 Reference Range: 80 - 120 mg / dl Electronic Signature(s) Signed: 10/26/2023 4:57:37 PM By: Redmond Pulling RN, BSN Entered By: Redmond Pulling on 10/26/2023 12:51:24

## 2023-10-27 NOTE — Telephone Encounter (Signed)
Will add potassium for metolazone usage Will readdress daily potassium dose at follow up 12/16

## 2023-10-30 MED ORDER — POTASSIUM CHLORIDE CRYS ER 20 MEQ PO TBCR: 40.0000 meq | EXTENDED_RELEASE_TABLET | ORAL | Status: DC

## 2023-10-30 NOTE — Addendum Note (Signed)
Addended by: Avice Funchess, Milagros Reap on: 10/30/2023 12:12 PM   Modules accepted: Orders

## 2023-10-31 ENCOUNTER — Encounter (HOSPITAL_COMMUNITY): Payer: 59

## 2023-11-01 ENCOUNTER — Telehealth: Payer: Self-pay

## 2023-11-01 ENCOUNTER — Other Ambulatory Visit (HOSPITAL_COMMUNITY): Payer: Self-pay

## 2023-11-01 NOTE — Progress Notes (Signed)
Paramedicine Encounter    Patient ID: [name], female    DOB: November 08, 1961, 62 y.o.   MRN: [id number]   Complaints-leg pain   Edema-legs wrapped  always has a degree of edema   Compliance with meds-yes  Pill box filled-yes  If so, by whom-paramedic    Refills needed-eliquis   Pt reports that she is doing better. Her cardiomems is fluctuating up and down yesterday 22-today 26. Her weight is down. She doesn't feel as bloated.   Last night for dinner, she had hamburger and some frozen onion rings that were cooked in oven. Pt taking all meds. Main complaint is the chronic leg/foot pain. She denies increased sob, no dizziness, no c/p.  Meds verified and pill box refilled.   CBG PTA-120  BP 124/74   Pulse 78   Resp 18   Wt 210 lb (95.3 kg)   LMP  (LMP Unknown)   SpO2 97%   BMI 38.41 kg/m  Weight yesterday-211 Last visit weight-215  @ clinic   Patient Care Team: Runell Gess, MD as PCP - Cardiology (Cardiology) Lanier Prude, MD as PCP - Electrophysiology (Cardiology) Runell Gess, MD as Consulting Physician (Cardiology) Nyoka Cowden, MD as Consulting Physician (Pulmonary Disease) System, Provider Not In  Patient Active Problem List   Diagnosis Date Noted   Heart failure ([name]) 10/11/2023   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) and diastolic (congestive) heart failure ([name]) 10/10/2023   Pre-ulcerative calluses 04/25/2022   COPD (chronic obstructive pulmonary disease) ([name]) 05/05/2021   OSA (obstructive sleep apnea) 05/05/2021   Stage 3b chronic kidney disease ([name]) 05/05/2021   Pressure injury of skin 05/04/2021   Diabetic nephropathy ([name]) 01/02/2021   Low back pain 06/01/2020   Hyperlipidemia 04/03/2020   PAD (peripheral artery disease) ([name]) 12/26/2019   NICM (nonischemic cardiomyopathy) ([name]) 06/20/2019   Non-healing ulcer ([name]) 06/20/2019   Coagulation disorder ([name]) 08/09/2017   Depression 07/21/2017   At risk for adverse drug  reaction 06/20/2017   Peripheral neuropathy 06/20/2017   S/P transmetatarsal amputation of foot, right ([name]) 06/05/2017   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) 05/19/2017   Obesity, class 2 02/24/2016   Encounter for therapeutic drug monitoring 02/10/2016   Symptomatic bradycardia 01/12/2016   Essential hypertension 12/22/2015   Elevated troponin    Acute on chronic diastolic CHF (congestive heart failure) ([name])    Anemia of chronic disease 11/08/2015   Hypokalemia 11/08/2015   Tobacco abuse 10/23/2015   Coronary artery disease    DOE (dyspnea on exertion) 04/29/2015   PAF (paroxysmal atrial fibrillation) ([name]) 01/16/2015   Carotid arterial disease ([name]) 01/16/2015   Insomnia 02/03/2014   S/P peripheral artery angioplasty - [place]boHawk atherectomy; R SFA 09/11/2013    Class: Acute   Leg pain, bilateral 08/19/2013   Hypothyroidism 07/31/2013   History of cocaine abuse ([name]) 06/13/2013   Long term current use of anticoagulant therapy 05/20/2013   Alcohol abuse    Narcotic abuse ([name])    Marijuana abuse    Alcoholic cirrhosis ([name])    Type 2 diabetes mellitus with hyperlipidemia ([name])     Current Outpatient Medications:    acetaminophen (TYLENOL) 500 MG tablet, Take 1,000 mg by mouth every 6 (six) hours as needed for headache (pain)., Disp: , Rfl:    albuterol (VENTOLIN HFA) 108 (90 Base) MCG/ACT inhaler, USE 2 PUFFS BY MOUTH EVERY FOUR HOURS, AS NEEDED, FOR COUGHING/WHEEZING, Disp: 8.5 g, Rfl: 0  apixaban (ELIQUIS) 5 MG TABS tablet, Take 1 tablet (5 mg total) by mouth 2 (two) times daily., Disp: 60 tablet, Rfl: 6   budesonide-formoterol (SYMBICORT) 160-4.5 MCG/ACT inhaler, Inhale 2 puffs into the lungs 2 (two) times daily., Disp: 1 each, Rfl: 2   buPROPion ER (WELLBUTRIN SR) 100 MG 12 hr tablet, Take 1 tablet (100 mg total) by mouth daily., Disp: 30 tablet, Rfl: 6   Cholecalciferol (VITAMIN D3 PO), Take 1 capsule by mouth  every morning., Disp: , Rfl:    fluticasone (FLONASE) 50 MCG/ACT nasal spray, Place 1 spray into both nostrils as needed for allergies or rhinitis., Disp: , Rfl:    insulin lispro (HUMALOG) 100 UNIT/ML KwikPen, Inject 10 Units into the skin 3 (three) times daily with meals., Disp: 15 mL, Rfl: 0   LANTUS SOLOSTAR 100 UNIT/ML Solostar Pen, Inject 37 Units into the skin daily., Disp: 15 mL, Rfl: 0   levothyroxine (SYNTHROID) 50 MCG tablet, TAKE 1 TABLET (50 MCG TOTAL) BY MOUTH DAILY BEFORE BREAKFAST (AM), Disp: 30 tablet, Rfl: 0   losartan (COZAAR) 25 MG tablet, Take 1 tablet (25 mg total) by mouth daily., Disp: 30 tablet, Rfl: 0   metolazone (ZAROXOLYN) 2.5 MG tablet, Take 1 tablet (2.5 mg total) by mouth once a week. Every Monday, Disp: 15 tablet, Rfl: 3   metoprolol succinate (TOPROL-XL) 25 MG 24 hr tablet, Take 1 tablet (25 mg total) by mouth daily., Disp: 90 tablet, Rfl: 3   pantoprazole (PROTONIX) 40 MG tablet, TAKE 1 TABLET (40 MG TOTAL) BY MOUTH DAILY(AM), Disp: 30 tablet, Rfl: 0   potassium chloride SA (KLOR-CON M) 20 MEQ tablet, Take 2 tablets (40 mEq total) by mouth once a week. With every dose of metolazone, Disp: , Rfl:    rosuvastatin (CRESTOR) 40 MG tablet, TAKE 1 TABLET (40 MG TOTAL) BY MOUTH DAILY (BEDTIME), Disp: 90 tablet, Rfl: 0   spironolactone (ALDACTONE) 25 MG tablet, Take 1 tablet (25 mg total) by mouth daily., Disp: 90 tablet, Rfl: 3   torsemide (DEMADEX) 100 MG tablet, Take 1 tablet (100 mg total) by mouth 2 (two) times daily., Disp: 180 tablet, Rfl: 3   triamcinolone ointment (KENALOG) 0.1 %, Apply topically 2 (two) times daily., Disp: 454 g, Rfl: 0   VITAMIN E PO, Take 1 tablet by mouth daily., Disp: , Rfl:    ACCU-CHEK GUIDE test strip, USE TO CHECK BLOOD SUGAR FOUR TIMES DAILY, Disp: , Rfl:    colchicine 0.6 MG tablet, Take 1.2 mg by mouth See admin instructions. Take 1.2 mg for flair up and an hour later take 0.6 mg if needed (Patient not taking: Reported on 10/26/2023),  Disp: , Rfl:    doxycycline (VIBRAMYCIN) 100 MG capsule, Take 1 capsule (100 mg total) by mouth 2 (two) times daily. (Patient not taking: Reported on 11/01/2023), Disp: 20 capsule, Rfl: 0   EASY COMFORT PEN NEEDLES 31G X 5 MM MISC, USE FOUR TIMES A DAY FOR INSULIN ADMINISTRATION, Disp: 100 each, Rfl: 1   Furosemide (FUROSCIX ), Inject into the skin as directed. As needed, Disp: , Rfl:    polyethylene glycol (MIRALAX / GLYCOLAX) 17 g packet, Take 17 g by mouth daily as needed for moderate constipation., Disp: 14 each, Rfl: 0 Allergies  Allergen Reactions   Gabapentin Nausea And Vomiting and Other (See Comments)    POSSIBLE SHAKING   Lyrica [Pregabalin] Other (See Comments)    Shaking       Social History   Socioeconomic History  Marital status: Single    Spouse name: Not on file   Number of children: 1   Years of education: 62   Highest education level: 12th grade  Occupational History   Occupation: disabled  Tobacco Use   Smoking status: Every Day    Current packs/day: 1.00    Average packs/day: 1 pack/day for 44.0 years (44.0 ttl pk-yrs)    Types: E-cigarettes, Cigarettes   Smokeless tobacco: Former    Types: Snuff  Vaping Use   Vaping status: Former   Devices: 11/26/2018 "stopped months ago"  Substance and Sexual Activity   Alcohol use: Not Currently    Comment: occ   Drug use: Not Currently    Types: "Crack" cocaine, Marijuana, Oxycodone   Sexual activity: Not Currently  Other Topics Concern   Not on file  Social History Narrative   ** Merged History Encounter **       Lives in [place], in motel with sister.  They are looking to move but don't have a place to go yet.     Social Drivers of Corporate investment banker Strain: Low Risk  (02/09/2023)   Overall Financial Resource Strain (CARDIA)    Difficulty of Paying Living Expenses: Not hard at all  Food Insecurity: No Food Insecurity (10/10/2023)   Hunger Vital Sign    Worried About Running Out of Food in the  Last Year: Never true    Ran Out of Food in the Last Year: Never true  Transportation Needs: No Transportation Needs (10/10/2023)   PRAPARE - Administrator, Civil Service (Medical): No    Lack of Transportation (Non-Medical): No  Physical Activity: Inactive (02/09/2023)   Exercise Vital Sign    Days of Exercise per Week: 0 days    Minutes of Exercise per Session: 0 min  Stress: No Stress Concern Present (02/09/2023)   [employer] of Occupational Health - Occupational Stress Questionnaire    Feeling of Stress : Not at all  Social Connections: Unknown (08/21/2023)   Received from [facility]   Social Network    Social Network: Not on file  Intimate Partner Violence: Not At Risk (10/10/2023)   Humiliation, Afraid, Rape, and Kick questionnaire    Fear of Current or Ex-Partner: No    Emotionally Abused: No    Physically Abused: No    Sexually Abused: No    Physical Exam      Future Appointments  Date Time Provider Department Center  11/06/2023 12:00 PM [facility] PA/NP [facility] None  11/20/2023  1:15 PM Maxwell Caul, MD [facility]  02/12/2024  4:00 PM [facility] ANNUAL WELLNESS VISIT [facility] None  04/09/2024  3:00 PM [facility] VASC 5 [facility]  04/09/2024  4:00 PM Leonie Douglas, MD [facility]       Kerry Hough, Paramedic [phone] [facility] Paramedic  11/01/23

## 2023-11-01 NOTE — Telephone Encounter (Signed)
Innocentia, LPN at [facility] called to give Dr. Alvis Lemmings a blood sugar reading on this patient of 341. I called her back and advised Dr. Alvis Lemmings is not the patient's PCP and she is not under Dr. Baxter Flattery care. Advised her to call PCP, she says ok.

## 2023-11-02 ENCOUNTER — Inpatient Hospital Stay: Payer: 59 | Admitting: Physician Assistant

## 2023-11-02 ENCOUNTER — Ambulatory Visit (HOSPITAL_BASED_OUTPATIENT_CLINIC_OR_DEPARTMENT_OTHER): Payer: 59 | Admitting: Internal Medicine

## 2023-11-02 ENCOUNTER — Telehealth (HOSPITAL_COMMUNITY): Payer: Self-pay

## 2023-11-02 NOTE — Telephone Encounter (Signed)
Contacted [facility] to request the following for refills:  Eliquis    Kerry Hough, Paramedic [phone] 11/02/23

## 2023-11-06 ENCOUNTER — Other Ambulatory Visit (HOSPITAL_COMMUNITY): Payer: Self-pay

## 2023-11-06 ENCOUNTER — Encounter (HOSPITAL_COMMUNITY): Payer: Self-pay

## 2023-11-06 ENCOUNTER — Ambulatory Visit (HOSPITAL_COMMUNITY)
Admission: RE | Admit: 2023-11-06 | Discharge: 2023-11-06 | Disposition: A | Discharge status: Home / Self Care | Payer: 59 | Source: Ambulatory Visit | Attending: Internal Medicine | Admitting: Internal Medicine

## 2023-11-06 VITALS — BP 120/64 | HR 64 | Ht 62.0 in | Wt 209.0 lb

## 2023-11-06 DIAGNOSIS — Z7901 Long term (current) use of anticoagulants: Secondary | ICD-10-CM | POA: Insufficient documentation

## 2023-11-06 DIAGNOSIS — G4733 Obstructive sleep apnea (adult) (pediatric): Secondary | ICD-10-CM | POA: Insufficient documentation

## 2023-11-06 DIAGNOSIS — I2510 Atherosclerotic heart disease of native coronary artery without angina pectoris: Secondary | ICD-10-CM | POA: Insufficient documentation

## 2023-11-06 DIAGNOSIS — N183 Chronic kidney disease, stage 3 unspecified: Secondary | ICD-10-CM | POA: Insufficient documentation

## 2023-11-06 DIAGNOSIS — N1831 Chronic kidney disease, stage 3a: Secondary | ICD-10-CM

## 2023-11-06 DIAGNOSIS — I5022 Chronic systolic (congestive) heart failure: Secondary | ICD-10-CM

## 2023-11-06 DIAGNOSIS — I739 Peripheral vascular disease, unspecified: Secondary | ICD-10-CM | POA: Diagnosis not present

## 2023-11-06 DIAGNOSIS — Z794 Long term (current) use of insulin: Secondary | ICD-10-CM | POA: Diagnosis not present

## 2023-11-06 DIAGNOSIS — I130 Hypertensive heart and chronic kidney disease with heart failure and stage 1 through stage 4 chronic kidney disease, or unspecified chronic kidney disease: Secondary | ICD-10-CM | POA: Insufficient documentation

## 2023-11-06 DIAGNOSIS — I5023 Acute on chronic systolic (congestive) heart failure: Secondary | ICD-10-CM | POA: Insufficient documentation

## 2023-11-06 DIAGNOSIS — I10 Essential (primary) hypertension: Secondary | ICD-10-CM

## 2023-11-06 DIAGNOSIS — E1151 Type 2 diabetes mellitus with diabetic peripheral angiopathy without gangrene: Secondary | ICD-10-CM | POA: Diagnosis not present

## 2023-11-06 DIAGNOSIS — I480 Paroxysmal atrial fibrillation: Secondary | ICD-10-CM | POA: Diagnosis not present

## 2023-11-06 DIAGNOSIS — F17200 Nicotine dependence, unspecified, uncomplicated: Secondary | ICD-10-CM | POA: Insufficient documentation

## 2023-11-06 DIAGNOSIS — Z8719 Personal history of other diseases of the digestive system: Secondary | ICD-10-CM

## 2023-11-06 DIAGNOSIS — E11621 Type 2 diabetes mellitus with foot ulcer: Secondary | ICD-10-CM | POA: Insufficient documentation

## 2023-11-06 DIAGNOSIS — L97529 Non-pressure chronic ulcer of other part of left foot with unspecified severity: Secondary | ICD-10-CM | POA: Insufficient documentation

## 2023-11-06 DIAGNOSIS — I6529 Occlusion and stenosis of unspecified carotid artery: Secondary | ICD-10-CM

## 2023-11-06 DIAGNOSIS — E782 Mixed hyperlipidemia: Secondary | ICD-10-CM

## 2023-11-06 DIAGNOSIS — J449 Chronic obstructive pulmonary disease, unspecified: Secondary | ICD-10-CM

## 2023-11-06 DIAGNOSIS — L97519 Non-pressure chronic ulcer of other part of right foot with unspecified severity: Secondary | ICD-10-CM | POA: Diagnosis not present

## 2023-11-06 DIAGNOSIS — E1122 Type 2 diabetes mellitus with diabetic chronic kidney disease: Secondary | ICD-10-CM | POA: Diagnosis not present

## 2023-11-06 LAB — BASIC METABOLIC PANEL
Anion gap: 11 (ref 5–15)
BUN: 71 mg/dL — ABNORMAL HIGH (ref 8–23)
CO2: 23 mmol/L (ref 22–32)
Calcium: 8.7 mg/dL — ABNORMAL LOW (ref 8.9–10.3)
Chloride: 97 mmol/L — ABNORMAL LOW (ref 98–111)
Creatinine, Ser: 1.52 mg/dL — ABNORMAL HIGH (ref 0.44–1.00)
GFR, Estimated: 39 mL/min — ABNORMAL LOW (ref >60)
Glucose, Bld: 265 mg/dL — ABNORMAL HIGH (ref 70–99)
Potassium: 4.6 mmol/L (ref 3.5–5.1)
Sodium: 131 mmol/L — ABNORMAL LOW (ref 135–145)

## 2023-11-06 LAB — CBC
HCT: 35.2 % — ABNORMAL LOW (ref 36.0–46.0)
Hemoglobin: 10.8 g/dL — ABNORMAL LOW (ref 12.0–15.0)
MCH: 27.7 pg (ref 26.0–34.0)
MCHC: 30.7 g/dL (ref 30.0–36.0)
MCV: 90.3 fL (ref 80.0–100.0)
Platelets: 274 10*3/uL (ref 150–400)
RBC: 3.9 MIL/uL (ref 3.87–5.11)
RDW: 14.6 % (ref 11.5–15.5)
WBC: 7.2 10*3/uL (ref 4.0–10.5)
nRBC: 0 % (ref 0.0–0.2)

## 2023-11-06 LAB — BRAIN NATRIURETIC PEPTIDE: B Natriuretic Peptide: 450.6 pg/mL — ABNORMAL HIGH (ref 0.0–100.0)

## 2023-11-06 NOTE — Patient Instructions (Addendum)
Thank you for coming in today  If you had labs drawn today, any labs that are abnormal the clinic will call you No news is good news  You have been referred to EP cardiology , their office will call you for further appointment details    Medications: No change  Follow up appointments:  Your physician recommends that you schedule a follow-up appointment in:  1 month in clinic   Do the following things EVERYDAY: Weigh yourself in the morning before breakfast. Write it down and keep it in a log. Take your medicines as prescribed Eat low salt foods--Limit salt (sodium) to 2000 mg per day.  Stay as active as you can everyday Limit all fluids for the day to less than 2 liters   At the [facility], you and your health needs are our priority. As part of our continuing mission to provide you with exceptional heart care, we have created designated Provider Care Teams. These Care Teams include your primary Cardiologist (physician) and Advanced Practice Providers (APPs- Physician Assistants and Nurse Practitioners) who all work together to provide you with the care you need, when you need it.   You may see any of the following providers on your designated Care Team at your next follow up: Dr Arvilla Meres Dr Marca Ancona Dr. Marcos Eke, NP Robbie Lis, [place] [facility] [place], [place] Brynda Peon, NP Karle Plumber, PharmD   Please be sure to bring in all your medications bottles to every appointment.    Thank you for choosing [facility]  If you have any questions or concerns before your next appointment please send [place] a message through [place] or call our office at [phone].    TO LEAVE A MESSAGE FOR THE NURSE SELECT OPTION 2, PLEASE LEAVE A MESSAGE INCLUDING: YOUR NAME DATE OF BIRTH CALL BACK NUMBER REASON FOR CALL**this is important as we prioritize the call backs  YOU WILL RECEIVE A  CALL BACK THE SAME DAY AS LONG AS YOU CALL BEFORE 4:00 PM

## 2023-11-06 NOTE — Progress Notes (Signed)
Patient ID: [name], female   DOB: August 18, 1961, 62 y.o.   MRN: [id number]    [facility] Note   PCP: [facility] PV: Dr. Allyson Sabal HF Cardiology: Dr. Shirlee Latch  CC: Heart failure follow up  [name] is a 62 y.o. with history of PAD, carotid stenosis s/p left CEA, CAD, chronic systolic CHF, paroxysmal atrial fibrillation and prior substance abuse.  She was admitted in 1/17 with acute hypoxemic respiratory failure in the setting of atrial fibrillation/RVR and volume overload.  She was initially intubated.  She converted back to NSR with amiodarone gtt.    She was admitted in 3/17 with symptomatic bradycardia.  She was supposed to stop diltiazem after this admission but continued to take it.  She presented later in 3/17, again with symptomatic bradycardia (junctional bradycardia), hypotension, and AKI.  Diltiazem and bisoprolol were stopped.  HR recovered and she has had no problems since.    In 5/18, she had peripheral angiography with atherectomy of right SFA.  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 In 8/20, she had peripheral angiography showing totally occluded left SFA.  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 Arbie Cookey.  ABIs in 2/21 were 0.44 on right, 0.99 on left.   Echo in 1/21 showed EF 50%, mild LVH, normal RV, PASP 60 mmHg.   Echo 03/08/21 EF 55-60% with basal to mid inferolateral hypokinesis.   Underwent PV angiogram with Dr. Allyson Sabal 7/22. She had drug-coated balloon angioplasty of the right SFA after being admitted for critical ischemia of the right foot.  Repeat peripheral arterial dopplers in 8/22 still showed severe right SFA disease.   Admitted 8/22 for bradycardia and AKI, bisoprolol subsequently stopped; amiodarone was continued.   Echo 4/24 EF 50-55%, RV normal.  Admitted 9/24 with UTI/sepsis. Required ICU care, BiPap and abx. Underwent RLE angiogram with  femoropopliteal angioplasty and stenting.  She was admitted in 11/24 with atrial fibrillation/RVR.  Echo showed EF 45-50%, normal RV, mild MR, anterolateral akinesis. She was cardioverted.  She was taken off losartan and spironolactone. Prior to admission, she was on torsemide 100 mg bid and metolazone 2.5 once a week.  She was sent home only on torsemide 100 mg daily and no metolazone.   She got readmitted again for CHF 11/26-11/29. She was diuresed w/ IV Lasix. She was transitioned back to torsemide 100 mg bid but instructed to take metolazone only PRN instead of once weekly. Of note, she was discharged home on 11/29 and her CardioMEMs reading next day on 11/30 showed elevated diastolic PA pressure of 31 mmHg (goal 20), suggesting she was not fully diuresed at d/c.   12/2, she was contacted given elevated CardioMEMs reading, suggesting continued volume overload. Reading was elevated at 37 mmHg. She was instructed to use 80 mg of Furoscix and to return to f/u. Seen for follow-up 12/04. Remained volume overloaded with Diastolic PA pressure of 36 (goal 20). She was wearing furoscix but UOP not as robust as with Torsemide. Switched back to Torsemide 100 BID and instructed to take metolazone 2.5 mg X 2 days. Cleda Daub also increased.   Today she returns for AHF follow up. Overall feeling ok just dealing with goat and sciatic pain. Denies palpitations, CP, dizziness, edema, or PND/Orthopnea.  Denies SOB. Appetite good. No fever or chills. Weight at home 209 pounds. Taking all medications. Denies ETOH or drug use. Smokes 1/3 PPD.  SH: Lives with sister.  Prior cocaine abuse.  Prior ETOH abuse.  Quit smoking in 1/17, restarted, and quit again in 4/19, restarted again.  FH: CAD  Review of systems complete and found to be negative unless listed in HPI.    Current Outpatient Medications  Medication Sig Dispense Refill   ACCU-CHEK GUIDE test strip USE TO CHECK BLOOD SUGAR FOUR TIMES DAILY     acetaminophen  (TYLENOL) 500 MG tablet Take 1,000 mg by mouth every 6 (six) hours as needed for headache (pain).     albuterol (VENTOLIN HFA) 108 (90 Base) MCG/ACT inhaler USE 2 PUFFS BY MOUTH EVERY FOUR HOURS, AS NEEDED, FOR COUGHING/WHEEZING 8.5 g 0   apixaban (ELIQUIS) 5 MG TABS tablet Take 1 tablet (5 mg total) by mouth 2 (two) times daily. 60 tablet 6   budesonide-formoterol (SYMBICORT) 160-4.5 MCG/ACT inhaler Inhale 2 puffs into the lungs 2 (two) times daily. 1 each 2   buPROPion ER (WELLBUTRIN SR) 100 MG 12 hr tablet Take 1 tablet (100 mg total) by mouth daily. 30 tablet 6   Cholecalciferol (VITAMIN D3 PO) Take 1 capsule by mouth every morning.     colchicine 0.6 MG tablet Take 1.2 mg by mouth See admin instructions. Take 1.2 mg for flair up and an hour later take 0.6 mg if needed     doxycycline (VIBRAMYCIN) 100 MG capsule Take 1 capsule (100 mg total) by mouth 2 (two) times daily. 20 capsule 0   EASY COMFORT PEN NEEDLES 31G X 5 MM MISC USE FOUR TIMES A DAY FOR INSULIN ADMINISTRATION 100 each 1   fluticasone (FLONASE) 50 MCG/ACT nasal spray Place 1 spray into both nostrils as needed for allergies or rhinitis.     Furosemide (FUROSCIX [place]) Inject into the skin as directed. As needed     insulin lispro (HUMALOG) 100 UNIT/ML KwikPen Inject 10 Units into the skin 3 (three) times daily with meals. 15 mL 0   LANTUS SOLOSTAR 100 UNIT/ML Solostar Pen Inject 37 Units into the skin daily. 15 mL 0   levothyroxine (SYNTHROID) 50 MCG tablet TAKE 1 TABLET (50 MCG TOTAL) BY MOUTH DAILY BEFORE BREAKFAST (AM) 30 tablet 0   losartan (COZAAR) 25 MG tablet Take 1 tablet (25 mg total) by mouth daily. 30 tablet 0   metolazone (ZAROXOLYN) 2.5 MG tablet Take 1 tablet (2.5 mg total) by mouth once a week. Every Monday 15 tablet 3   metoprolol succinate (TOPROL-XL) 25 MG 24 hr tablet Take 1 tablet (25 mg total) by mouth daily. 90 tablet 3   pantoprazole (PROTONIX) 40 MG tablet TAKE 1 TABLET (40 MG TOTAL) BY MOUTH DAILY(AM) 30 tablet  0   polyethylene glycol (MIRALAX / GLYCOLAX) 17 g packet Take 17 g by mouth daily as needed for moderate constipation. 14 each 0   potassium chloride SA (KLOR-CON M) 20 MEQ tablet Take 2 tablets (40 mEq total) by mouth once a week. With every dose of metolazone     rosuvastatin (CRESTOR) 40 MG tablet TAKE 1 TABLET (40 MG TOTAL) BY MOUTH DAILY (BEDTIME) 90 tablet 0   spironolactone (ALDACTONE) 25 MG tablet Take 1 tablet (25 mg total) by mouth daily. 90 tablet 3   torsemide (DEMADEX) 100 MG tablet Take 1 tablet (100 mg total) by mouth 2 (two) times daily. 180 tablet 3   triamcinolone ointment (KENALOG) 0.1 % Apply topically 2 (two) times daily. 454 g 0   VITAMIN E PO Take 1 tablet by mouth daily.  No current facility-administered medications for this encounter.   BP 120/64   Pulse 64   Ht 5\' 2"  (1.575 m)   Wt 94.8 kg (209 lb)   LMP  (LMP Unknown)   SpO2 98%   BMI 38.23 kg/m   Wt Readings from Last 3 Encounters:  11/06/23 94.8 kg (209 lb)  11/01/23 95.3 kg (210 lb)  10/26/23 97.5 kg (215 lb)   PHYSICAL EXAM: General:  well appearing.  No respiratory difficulty. Arrived in [facility] HEENT: normal Neck: supple. JVD ~10 cm. Carotids 2+ bilat; no bruits. No lymphadenopathy or thyromegaly appreciated. Cor: PMI nondisplaced. Regular rate & rhythm. No rubs, gallops or murmurs. Lungs: diminished Abdomen: obese, soft, nontender, nondistended. No hepatosplenomegaly. No bruits or masses. Good bowel sounds. Extremities: no cyanosis, clubbing, rash, +1 LLE edema. R TMA. Bilateral legs in boots.  Neuro: alert & oriented x 3, cranial nerves grossly intact. moves all 4 extremities w/o difficulty. Affect pleasant.   Assessment/Plan: 1. Acute on Chronic HF with mid range EF: LHC in 12/15 showing only 80% stenosis in small OM1. EF as low as 40-45% in the past. Echo 4/22 showed EF 55-60% with grade 2 diastolic dysfunction.  Echo (4/24) EF 50-55%.  Echo in 11/24 with EF 45-50%, normal RV, mild MR,  anterolateral akinesis.   - NYHA class III (chronic). Volume improving on increased diuretic regimen. Diastolic PA pressure today is 22 (goal 20). Has been submitting daily CardioMEMs readings  - Continue torsemide 100 mg bid - Continue metolazone once weekly   - Continue Losartan 25 mg daily  - Continue spironolactone 25 mg daily  - She is off Jardiance due to severe UTIs.  - Continue Toprol XL 25 mg daily.  - BMP/BNP today 2. Atrial fibrillation: Paroxysmal. RRR on exam  - Continue Eliquis 5 mg bid. Denies abnormal bleeding. CBC today - Refer to EP for AF ablation 3. PAD: Claudication bilaterally.  Not candidate for cilostazol with CHF.  She has had a left fem-pop bypass in 8/20.  ABIs in 2/21 were very abnormal on right. She underwent PV angiogram with balloon angioplasty of the right SFA 7/22.  8/22 dopplers still showed severe right SFA disease. Dopplers 2/23 showed patent right SFA.  Peripheral arterial dopplers in 2/23 showed 50-74% stenosis right SFA and popliteal.  S/p RLE femoropopliteal angioplasty and stenting in 9/24. She still has bilateral foot ulcers.  - [facility], [facility] 3 weeks. - Continue rosuvastatin.  - She is on apixaban so no ASA.  4. COPD:  back to smoking 1/2 ppd. Continue bupropion.  - Discussed smoking cessation.    5. H/o cirrhosis: Likely from ETOH, she has quit.  6. OSA: Not able to tolerate CPAP.   7. CKD stage III:  Baseline creatinine around 1.5 - BMET today 8. HTN: stable today  9.Type 2 diabetes: Per PCP.  She is on insulin. 10. CAD: LHC in 12/15 with 80% stenosis in small OM1, nonobstructive disease in other territories. No chest pain. - Continue rosuvastatin.  - Not on ASA with need for Eliquis. 11. Carotid stenosis: Stable 4/24 dopplers. 12. Hyperlipidemia: She is on Crestor and Vascepa.   F/u 1 month with APP to monitor fluid, if stable then can space out visits more.   [name], AGACNP-BC  11/06/2023

## 2023-11-06 NOTE — Progress Notes (Signed)
Paramedicine Encounter    Patient ID: [name], female    DOB: 1961-11-14, 62 y.o.   MRN: [id number]   Pt was seen in clinic today, she had missed her f/u last week and got it resch for today.  No changes.  Labs stable.   Meds verified and pill box refilled.   Refills needed- Insulin-she will call tomor    Patient Care Team: Runell Gess, MD as PCP - Cardiology (Cardiology) Lanier Prude, MD as PCP - Electrophysiology (Cardiology) Runell Gess, MD as Consulting Physician (Cardiology) Nyoka Cowden, MD as Consulting Physician (Pulmonary Disease) System, Provider Not In  Patient Active Problem List   Diagnosis Date Noted   Heart failure ([name]) 10/11/2023   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) and diastolic (congestive) heart failure ([name]) 10/10/2023   Pre-ulcerative calluses 04/25/2022   COPD (chronic obstructive pulmonary disease) ([name]) 05/05/2021   OSA (obstructive sleep apnea) 05/05/2021   Stage 3b chronic kidney disease ([name]) 05/05/2021   Pressure injury of skin 05/04/2021   Diabetic nephropathy ([name]) 01/02/2021   Low back pain 06/01/2020   Hyperlipidemia 04/03/2020   PAD (peripheral artery disease) ([name]) 12/26/2019   NICM (nonischemic cardiomyopathy) ([name]) 06/20/2019   Non-healing ulcer ([name]) 06/20/2019   Coagulation disorder ([name]) 08/09/2017   Depression 07/21/2017   At risk for adverse drug reaction 06/20/2017   Peripheral neuropathy 06/20/2017   S/P transmetatarsal amputation of foot, right ([name]) 06/05/2017   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) 05/19/2017   Obesity, class 2 02/24/2016   Encounter for therapeutic drug monitoring 02/10/2016   Symptomatic bradycardia 01/12/2016   Essential hypertension 12/22/2015   Elevated troponin    Acute on chronic diastolic CHF (congestive heart failure) ([name])    Anemia of chronic disease 11/08/2015   Hypokalemia 11/08/2015    Tobacco abuse 10/23/2015   Coronary artery disease    DOE (dyspnea on exertion) 04/29/2015   PAF (paroxysmal atrial fibrillation) ([name]) 01/16/2015   Carotid arterial disease ([name]) 01/16/2015   Insomnia 02/03/2014   S/P peripheral artery angioplasty - TurboHawk atherectomy; R SFA 09/11/2013    Class: Acute   Leg pain, bilateral 08/19/2013   Hypothyroidism 07/31/2013   History of cocaine abuse ([name]) 06/13/2013   Long term current use of anticoagulant therapy 05/20/2013   Alcohol abuse    Narcotic abuse ([name])    Marijuana abuse    Alcoholic cirrhosis ([name])    Type 2 diabetes mellitus with hyperlipidemia ([name])     Current Outpatient Medications:    ACCU-CHEK GUIDE test strip, USE TO CHECK BLOOD SUGAR FOUR TIMES DAILY, Disp: , Rfl:    acetaminophen (TYLENOL) 500 MG tablet, Take 1,000 mg by mouth every 6 (six) hours as needed for headache (pain)., Disp: , Rfl:    albuterol (VENTOLIN HFA) 108 (90 Base) MCG/ACT inhaler, USE 2 PUFFS BY MOUTH EVERY FOUR HOURS, AS NEEDED, FOR COUGHING/WHEEZING, Disp: 8.5 g, Rfl: 0   apixaban (ELIQUIS) 5 MG TABS tablet, Take 1 tablet (5 mg total) by mouth 2 (two) times daily., Disp: 60 tablet, Rfl: 6   budesonide-formoterol (SYMBICORT) 160-4.5 MCG/ACT inhaler, Inhale 2 puffs into the lungs 2 (two) times daily., Disp: 1 each, Rfl: 2   buPROPion ER (WELLBUTRIN SR) 100 MG 12 hr tablet, Take 1 tablet (100 mg total) by mouth daily., Disp: 30 tablet, Rfl: 6   Cholecalciferol (VITAMIN D3 PO), Take 1 capsule by mouth every morning., Disp: , Rfl:  colchicine 0.6 MG tablet, Take 1.2 mg by mouth See admin instructions. Take 1.2 mg for flair up and an hour later take 0.6 mg if needed, Disp: , Rfl:    insulin lispro (HUMALOG) 100 UNIT/ML KwikPen, Inject 10 Units into the skin 3 (three) times daily with meals., Disp: 15 mL, Rfl: 0   LANTUS SOLOSTAR 100 UNIT/ML Solostar Pen, Inject 37 Units into the skin daily., Disp: 15 mL, Rfl: 0   levothyroxine (SYNTHROID) 50 MCG tablet,  TAKE 1 TABLET (50 MCG TOTAL) BY MOUTH DAILY BEFORE BREAKFAST (AM), Disp: 30 tablet, Rfl: 0   losartan (COZAAR) 25 MG tablet, Take 1 tablet (25 mg total) by mouth daily., Disp: 30 tablet, Rfl: 0   metolazone (ZAROXOLYN) 2.5 MG tablet, Take 1 tablet (2.5 mg total) by mouth once a week. Every Monday, Disp: 15 tablet, Rfl: 3   metoprolol succinate (TOPROL-XL) 25 MG 24 hr tablet, Take 1 tablet (25 mg total) by mouth daily., Disp: 90 tablet, Rfl: 3   pantoprazole (PROTONIX) 40 MG tablet, TAKE 1 TABLET (40 MG TOTAL) BY MOUTH DAILY(AM), Disp: 30 tablet, Rfl: 0   potassium chloride SA (KLOR-CON M) 20 MEQ tablet, Take 2 tablets (40 mEq total) by mouth once a week. With every dose of metolazone, Disp: , Rfl:    rosuvastatin (CRESTOR) 40 MG tablet, TAKE 1 TABLET (40 MG TOTAL) BY MOUTH DAILY (BEDTIME), Disp: 90 tablet, Rfl: 0   spironolactone (ALDACTONE) 25 MG tablet, Take 1 tablet (25 mg total) by mouth daily., Disp: 90 tablet, Rfl: 3   torsemide (DEMADEX) 100 MG tablet, Take 1 tablet (100 mg total) by mouth 2 (two) times daily., Disp: 180 tablet, Rfl: 3   VITAMIN E PO, Take 1 tablet by mouth daily., Disp: , Rfl:    doxycycline (VIBRAMYCIN) 100 MG capsule, Take 1 capsule (100 mg total) by mouth 2 (two) times daily. (Patient not taking: Reported on 11/06/2023), Disp: 20 capsule, Rfl: 0   EASY COMFORT PEN NEEDLES 31G X 5 MM MISC, USE FOUR TIMES A DAY FOR INSULIN ADMINISTRATION, Disp: 100 each, Rfl: 1   fluticasone (FLONASE) 50 MCG/ACT nasal spray, Place 1 spray into both nostrils as needed for allergies or rhinitis., Disp: , Rfl:    Furosemide (FUROSCIX [place]), Inject into the skin as directed. As needed, Disp: , Rfl:    polyethylene glycol (MIRALAX / GLYCOLAX) 17 g packet, Take 17 g by mouth daily as needed for moderate constipation., Disp: 14 each, Rfl: 0   triamcinolone ointment (KENALOG) 0.1 %, Apply topically 2 (two) times daily., Disp: 454 g, Rfl: 0 Allergies  Allergen Reactions   Gabapentin Nausea And  Vomiting and Other (See Comments)    POSSIBLE SHAKING   Lyrica [Pregabalin] Other (See Comments)    Shaking       Social History   Socioeconomic History   Marital status: Single    Spouse name: Not on file   Number of children: 1   Years of education: 12   Highest education level: 12th grade  Occupational History   Occupation: disabled  Tobacco Use   Smoking status: Every Day    Current packs/day: 1.00    Average packs/day: 1 pack/day for 44.0 years (44.0 ttl pk-yrs)    Types: E-cigarettes, Cigarettes   Smokeless tobacco: Former    Types: Snuff  Vaping Use   Vaping status: Former   Devices: 11/26/2018 "stopped months ago"  Substance and Sexual Activity   Alcohol use: Not Currently    Comment: occ  Drug use: Not Currently    Types: "Crack" cocaine, Marijuana, Oxycodone   Sexual activity: Not Currently  Other Topics Concern   Not on file  Social History Narrative   ** Merged History Encounter **       Lives in [place], in motel with sister.  They are looking to move but don't have a place to go yet.     Social Drivers of Corporate investment banker Strain: Low Risk  (02/09/2023)   Overall Financial Resource Strain (CARDIA)    Difficulty of Paying Living Expenses: Not hard at all  Food Insecurity: No Food Insecurity (10/10/2023)   Hunger Vital Sign    Worried About Running Out of Food in the Last Year: Never true    Ran Out of Food in the Last Year: Never true  Transportation Needs: No Transportation Needs (10/10/2023)   PRAPARE - Administrator, Civil Service (Medical): No    Lack of Transportation (Non-Medical): No  Physical Activity: Inactive (02/09/2023)   Exercise Vital Sign    Days of Exercise per Week: 0 days    Minutes of Exercise per Session: 0 min  Stress: No Stress Concern Present (02/09/2023)   [employer] of Occupational Health - Occupational Stress Questionnaire    Feeling of Stress : Not at all  Social Connections: Unknown  (08/21/2023)   Received from [facility]   Social Network    Social Network: Not on file  Intimate Partner Violence: Not At Risk (10/10/2023)   Humiliation, Afraid, Rape, and Kick questionnaire    Fear of Current or Ex-Partner: No    Emotionally Abused: No    Physically Abused: No    Sexually Abused: No    Physical Exam      Future Appointments  Date Time Provider Department Center  11/20/2023  1:15 PM Camelia Phenes, DO [facility]  12/07/2023 11:30 AM [facility] PA/NP [facility] None  02/12/2024  4:00 PM [facility] ANNUAL WELLNESS VISIT [facility] None  04/09/2024  3:00 PM [facility] HS VASC 5 [facility]  04/09/2024  4:00 PM Leonie Douglas, MD [facility]       Kerry Hough, Paramedic [phone] [facility] Paramedic  11/06/23

## 2023-11-07 ENCOUNTER — Other Ambulatory Visit: Payer: Self-pay | Admitting: Family Medicine

## 2023-11-13 ENCOUNTER — Other Ambulatory Visit (HOSPITAL_COMMUNITY): Payer: Self-pay

## 2023-11-13 NOTE — Progress Notes (Signed)
Paramedicine Encounter    Patient ID: [name], female    DOB: Apr 06, 1961, 62 y.o.   MRN: [id number]   Complaints-leg pain   Edema-none   Compliance with meds-yes   Pill box filled-yes  If so, by whom-pill box   Refills needed-metolazone      Pt reports she is doing ok, other that her leg pain.  Her PCP sent in allopurinol and colchicine for her to start taking again.  She reports getting over a stomach virus that lasted approx 3 days. She vomited twice on Christmas day and had diarrhea for a few days.  But feeling better now. Still has some loose stools.  She reports eating and drinking ok.   Meds verified and pill box refilled.  No missed doses of her meds.   Legs look good. No longer wrapped. She said her leg wounds are healed but the one on her foot is not.. she goes for f/u next week on that.  Lungs sound wheezing lower lobes and upper rt side, denies increased sob.  No c/p.  She is due for her 2nd dose of inhaler.   Will f/u next week.    BP (!) 160/60   Pulse 76   Resp 20   Wt 209 lb (94.8 kg)   LMP  (LMP Unknown)   SpO2 98%   BMI 38.23 kg/m  Weight yesterday-205  Last visit weight-209 @ clinic last week   Patient Care Team: Runell Gess, MD as PCP - Cardiology (Cardiology) Lanier Prude, MD as PCP - Electrophysiology (Cardiology) Runell Gess, MD as Consulting Physician (Cardiology) Nyoka Cowden, MD as Consulting Physician (Pulmonary Disease) System, Provider Not In  Patient Active Problem List   Diagnosis Date Noted   Heart failure ([name]) 10/11/2023   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) and diastolic (congestive) heart failure ([name]) 10/10/2023   Pre-ulcerative calluses 04/25/2022   COPD (chronic obstructive pulmonary disease) ([name]) 05/05/2021   OSA (obstructive sleep apnea) 05/05/2021   Stage 3b chronic kidney disease ([name]) 05/05/2021   Pressure injury of skin 05/04/2021   Diabetic nephropathy ([name]) 01/02/2021    Low back pain 06/01/2020   Hyperlipidemia 04/03/2020   PAD (peripheral artery disease) ([name]) 12/26/2019   NICM (nonischemic cardiomyopathy) ([name]) 06/20/2019   Non-healing ulcer ([name]) 06/20/2019   Coagulation disorder ([name]) 08/09/2017   Depression 07/21/2017   At risk for adverse drug reaction 06/20/2017   Peripheral neuropathy 06/20/2017   S/P transmetatarsal amputation of foot, right ([name]) 06/05/2017   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) 05/19/2017   Obesity, class 2 02/24/2016   Encounter for therapeutic drug monitoring 02/10/2016   Symptomatic bradycardia 01/12/2016   Essential hypertension 12/22/2015   Elevated troponin    Acute on chronic diastolic CHF (congestive heart failure) ([name])    Anemia of chronic disease 11/08/2015   Hypokalemia 11/08/2015   Tobacco abuse 10/23/2015   Coronary artery disease    DOE (dyspnea on exertion) 04/29/2015   PAF (paroxysmal atrial fibrillation) ([name]) 01/16/2015   Carotid arterial disease ([name]) 01/16/2015   Insomnia 02/03/2014   S/P peripheral artery angioplasty - TurboHawk atherectomy; R SFA 09/11/2013    Class: Acute   Leg pain, bilateral 08/19/2013   Hypothyroidism 07/31/2013   History of cocaine abuse ([name]) 06/13/2013   Long term current use of anticoagulant therapy 05/20/2013   Alcohol abuse    Narcotic abuse ([name])    Marijuana abuse    Alcoholic  cirrhosis ([name])    Type 2 diabetes mellitus with hyperlipidemia ([name])     Current Outpatient Medications:    ACCU-CHEK GUIDE test strip, USE TO CHECK BLOOD SUGAR FOUR TIMES DAILY, Disp: , Rfl:    acetaminophen (TYLENOL) 500 MG tablet, Take 1,000 mg by mouth every 6 (six) hours as needed for headache (pain)., Disp: , Rfl:    albuterol (VENTOLIN HFA) 108 (90 Base) MCG/ACT inhaler, USE 2 PUFFS BY MOUTH EVERY FOUR HOURS, AS NEEDED, FOR COUGHING/WHEEZING, Disp: 8.5 g, Rfl: 0   allopurinol (ZYLOPRIM) 100 MG tablet, Take 150 mg by  mouth daily., Disp: , Rfl:    apixaban (ELIQUIS) 5 MG TABS tablet, Take 1 tablet (5 mg total) by mouth 2 (two) times daily., Disp: 60 tablet, Rfl: 6   budesonide-formoterol (SYMBICORT) 160-4.5 MCG/ACT inhaler, Inhale 2 puffs into the lungs 2 (two) times daily., Disp: 1 each, Rfl: 2   buPROPion ER (WELLBUTRIN SR) 100 MG 12 hr tablet, Take 1 tablet (100 mg total) by mouth daily., Disp: 30 tablet, Rfl: 6   Cholecalciferol (VITAMIN D3 PO), Take 1 capsule by mouth every morning., Disp: , Rfl:    colchicine 0.6 MG tablet, Take 1.2 mg by mouth See admin instructions. Take 1.2 mg for flair up and an hour later take 0.6 mg if needed, Disp: , Rfl:    EASY COMFORT PEN NEEDLES 31G X 5 MM MISC, USE FOUR TIMES A DAY FOR INSULIN ADMINISTRATION, Disp: 100 each, Rfl: 1   fluticasone (FLONASE) 50 MCG/ACT nasal spray, Place 1 spray into both nostrils as needed for allergies or rhinitis., Disp: , Rfl:    insulin lispro (HUMALOG) 100 UNIT/ML KwikPen, Inject 10 Units into the skin 3 (three) times daily with meals., Disp: 15 mL, Rfl: 0   LANTUS SOLOSTAR 100 UNIT/ML Solostar Pen, Inject 37 Units into the skin daily., Disp: 15 mL, Rfl: 0   levothyroxine (SYNTHROID) 50 MCG tablet, TAKE 1 TABLET (50 MCG TOTAL) BY MOUTH DAILY BEFORE BREAKFAST (AM), Disp: 30 tablet, Rfl: 0   metolazone (ZAROXOLYN) 2.5 MG tablet, Take 1 tablet (2.5 mg total) by mouth once a week. Every Monday, Disp: 15 tablet, Rfl: 3   metoprolol succinate (TOPROL-XL) 25 MG 24 hr tablet, Take 1 tablet (25 mg total) by mouth daily., Disp: 90 tablet, Rfl: 3   pantoprazole (PROTONIX) 40 MG tablet, TAKE 1 TABLET (40 MG TOTAL) BY MOUTH DAILY(AM), Disp: 30 tablet, Rfl: 0   potassium chloride SA (KLOR-CON M) 20 MEQ tablet, Take 2 tablets (40 mEq total) by mouth once a week. With every dose of metolazone, Disp: , Rfl:    rosuvastatin (CRESTOR) 40 MG tablet, TAKE 1 TABLET (40 MG TOTAL) BY MOUTH DAILY (BEDTIME), Disp: 90 tablet, Rfl: 0   spironolactone (ALDACTONE) 25 MG  tablet, Take 1 tablet (25 mg total) by mouth daily., Disp: 90 tablet, Rfl: 3   torsemide (DEMADEX) 100 MG tablet, Take 1 tablet (100 mg total) by mouth 2 (two) times daily., Disp: 180 tablet, Rfl: 3   triamcinolone ointment (KENALOG) 0.1 %, Apply topically 2 (two) times daily., Disp: 454 g, Rfl: 0   VITAMIN E PO, Take 1 tablet by mouth daily., Disp: , Rfl:    doxycycline (VIBRAMYCIN) 100 MG capsule, Take 1 capsule (100 mg total) by mouth 2 (two) times daily. (Patient not taking: Reported on 11/13/2023), Disp: 20 capsule, Rfl: 0   Furosemide (FUROSCIX [place]), Inject into the skin as directed. As needed (Patient not taking: Reported on 11/13/2023), Disp: , Rfl:  losartan (COZAAR) 25 MG tablet, Take 1 tablet (25 mg total) by mouth daily., Disp: 30 tablet, Rfl: 0   polyethylene glycol (MIRALAX / GLYCOLAX) 17 g packet, Take 17 g by mouth daily as needed for moderate constipation., Disp: 14 each, Rfl: 0 Allergies  Allergen Reactions   Gabapentin Nausea And Vomiting and Other (See Comments)    POSSIBLE SHAKING   Lyrica [Pregabalin] Other (See Comments)    Shaking       Social History   Socioeconomic History   Marital status: Single    Spouse name: Not on file   Number of children: 1   Years of education: 12   Highest education level: 12th grade  Occupational History   Occupation: disabled  Tobacco Use   Smoking status: Every Day    Current packs/day: 1.00    Average packs/day: 1 pack/day for 44.0 years (44.0 ttl pk-yrs)    Types: E-cigarettes, Cigarettes   Smokeless tobacco: Former    Types: Snuff  Vaping Use   Vaping status: Former   Devices: 11/26/2018 "stopped months ago"  Substance and Sexual Activity   Alcohol use: Not Currently    Comment: occ   Drug use: Not Currently    Types: "Crack" cocaine, Marijuana, Oxycodone   Sexual activity: Not Currently  Other Topics Concern   Not on file  Social History Narrative   ** Merged History Encounter **       Lives in [place], in motel  with sister.  They are looking to move but don't have a place to go yet.     Social Drivers of Corporate investment banker Strain: Low Risk  (02/09/2023)   Overall Financial Resource Strain (CARDIA)    Difficulty of Paying Living Expenses: Not hard at all  Food Insecurity: No Food Insecurity (10/10/2023)   Hunger Vital Sign    Worried About Running Out of Food in the Last Year: Never true    Ran Out of Food in the Last Year: Never true  Transportation Needs: No Transportation Needs (10/10/2023)   PRAPARE - Administrator, Civil Service (Medical): No    Lack of Transportation (Non-Medical): No  Physical Activity: Inactive (02/09/2023)   Exercise Vital Sign    Days of Exercise per Week: 0 days    Minutes of Exercise per Session: 0 min  Stress: No Stress Concern Present (02/09/2023)   [employer] of Occupational Health - Occupational Stress Questionnaire    Feeling of Stress : Not at all  Social Connections: Unknown (08/21/2023)   Received from [facility]   Social Network    Social Network: Not on file  Intimate Partner Violence: Not At Risk (10/10/2023)   Humiliation, Afraid, Rape, and Kick questionnaire    Fear of Current or Ex-Partner: No    Emotionally Abused: No    Physically Abused: No    Sexually Abused: No    Physical Exam      Future Appointments  Date Time Provider Department Center  11/20/2023  1:15 PM Camelia Phenes, DO [facility]  12/07/2023 11:30 AM [facility] PA/NP [facility] None  02/12/2024  4:00 PM [facility] ANNUAL WELLNESS VISIT [facility] None  04/09/2024  3:00 PM [facility] HS VASC 4 [facility]  04/09/2024  4:00 PM Leonie Douglas, MD [facility]       Kerry Hough, Paramedic [phone] [facility] Paramedic  11/13/23

## 2023-11-15 ENCOUNTER — Other Ambulatory Visit: Payer: Self-pay | Admitting: Physician Assistant

## 2023-11-20 ENCOUNTER — Encounter (HOSPITAL_BASED_OUTPATIENT_CLINIC_OR_DEPARTMENT_OTHER): Payer: 59 | Attending: Internal Medicine | Admitting: Internal Medicine

## 2023-11-20 ENCOUNTER — Telehealth (HOSPITAL_COMMUNITY): Payer: Self-pay

## 2023-11-20 DIAGNOSIS — E1165 Type 2 diabetes mellitus with hyperglycemia: Secondary | ICD-10-CM | POA: Diagnosis not present

## 2023-11-20 DIAGNOSIS — I872 Venous insufficiency (chronic) (peripheral): Secondary | ICD-10-CM | POA: Diagnosis not present

## 2023-11-20 DIAGNOSIS — X58XXXA Exposure to other specified factors, initial encounter: Secondary | ICD-10-CM | POA: Diagnosis not present

## 2023-11-20 DIAGNOSIS — L97828 Non-pressure chronic ulcer of other part of left lower leg with other specified severity: Secondary | ICD-10-CM | POA: Insufficient documentation

## 2023-11-20 DIAGNOSIS — I130 Hypertensive heart and chronic kidney disease with heart failure and stage 1 through stage 4 chronic kidney disease, or unspecified chronic kidney disease: Secondary | ICD-10-CM | POA: Diagnosis not present

## 2023-11-20 DIAGNOSIS — E1140 Type 2 diabetes mellitus with diabetic neuropathy, unspecified: Secondary | ICD-10-CM | POA: Insufficient documentation

## 2023-11-20 DIAGNOSIS — I5033 Acute on chronic diastolic (congestive) heart failure: Secondary | ICD-10-CM | POA: Diagnosis not present

## 2023-11-20 DIAGNOSIS — L89322 Pressure ulcer of left buttock, stage 2: Secondary | ICD-10-CM | POA: Insufficient documentation

## 2023-11-20 DIAGNOSIS — L97512 Non-pressure chronic ulcer of other part of right foot with fat layer exposed: Secondary | ICD-10-CM | POA: Diagnosis not present

## 2023-11-20 DIAGNOSIS — F17210 Nicotine dependence, cigarettes, uncomplicated: Secondary | ICD-10-CM | POA: Diagnosis not present

## 2023-11-20 DIAGNOSIS — L97522 Non-pressure chronic ulcer of other part of left foot with fat layer exposed: Secondary | ICD-10-CM | POA: Diagnosis not present

## 2023-11-20 DIAGNOSIS — K7460 Unspecified cirrhosis of liver: Secondary | ICD-10-CM | POA: Insufficient documentation

## 2023-11-20 DIAGNOSIS — I2510 Atherosclerotic heart disease of native coronary artery without angina pectoris: Secondary | ICD-10-CM | POA: Insufficient documentation

## 2023-11-20 DIAGNOSIS — N1832 Chronic kidney disease, stage 3b: Secondary | ICD-10-CM | POA: Diagnosis not present

## 2023-11-20 DIAGNOSIS — L89312 Pressure ulcer of right buttock, stage 2: Secondary | ICD-10-CM | POA: Insufficient documentation

## 2023-11-20 DIAGNOSIS — E11621 Type 2 diabetes mellitus with foot ulcer: Secondary | ICD-10-CM | POA: Diagnosis not present

## 2023-11-20 DIAGNOSIS — E11622 Type 2 diabetes mellitus with other skin ulcer: Secondary | ICD-10-CM | POA: Insufficient documentation

## 2023-11-20 DIAGNOSIS — E1151 Type 2 diabetes mellitus with diabetic peripheral angiopathy without gangrene: Secondary | ICD-10-CM | POA: Insufficient documentation

## 2023-11-20 DIAGNOSIS — E1122 Type 2 diabetes mellitus with diabetic chronic kidney disease: Secondary | ICD-10-CM | POA: Insufficient documentation

## 2023-11-20 DIAGNOSIS — G4730 Sleep apnea, unspecified: Secondary | ICD-10-CM | POA: Diagnosis not present

## 2023-11-20 DIAGNOSIS — M1990 Unspecified osteoarthritis, unspecified site: Secondary | ICD-10-CM | POA: Insufficient documentation

## 2023-11-20 DIAGNOSIS — J449 Chronic obstructive pulmonary disease, unspecified: Secondary | ICD-10-CM | POA: Insufficient documentation

## 2023-11-20 DIAGNOSIS — I87331 Chronic venous hypertension (idiopathic) with ulcer and inflammation of right lower extremity: Secondary | ICD-10-CM | POA: Diagnosis not present

## 2023-11-20 DIAGNOSIS — L97812 Non-pressure chronic ulcer of other part of right lower leg with fat layer exposed: Secondary | ICD-10-CM | POA: Insufficient documentation

## 2023-11-20 DIAGNOSIS — I4891 Unspecified atrial fibrillation: Secondary | ICD-10-CM | POA: Diagnosis not present

## 2023-11-20 DIAGNOSIS — S60512A Abrasion of left hand, initial encounter: Secondary | ICD-10-CM | POA: Diagnosis not present

## 2023-11-21 ENCOUNTER — Other Ambulatory Visit (HOSPITAL_COMMUNITY): Payer: Self-pay

## 2023-11-21 NOTE — Progress Notes (Signed)
 Paramedicine Encounter    Patient ID: [name], female    DOB: 1961-09-18, 63 y.o.   MRN: [id number]   Complaints-leg pain   Edema-slight -rt leg is wrapped  Compliance with meds-yes  Pill box filled-yes X 1 wk If so, by whom-paramedic  Refills needed-wellbutrin , rosuvastatin , thyroid , metolazone --ordered those while I was here for delivery    Pt reports she is doing ok. She denies increased sob, no dizziness, no c/p.  Rt leg is wrapped-still a small hole in that foot they are still watching  Just took her meds this morning.  She had a congested sounded cough, but her lungs are clear, no wheezing noted. Using her inhalers. Still smoking. Cough isnt unusual though.   Meds verified and pill box refilled.   [name] reached out yesterday to report her cardiomems is at goal.  Advised pt of this and good job and whatever they are doing to keep doing it.  Her sister is helping her keep track of her fluid intake and limiting salty foods.   BP 132/68   Pulse 75   Resp 18   Wt 210 lb (95.3 kg)   LMP  (LMP Unknown)   SpO2 97%   BMI 38.41 kg/m  Weight yesterday-210 Last visit weight-209  Patient Care Team: Court Dorn PARAS, MD as PCP - Cardiology (Cardiology) Cindie Ole DASEN, MD as PCP - Electrophysiology (Cardiology) Court Dorn PARAS, MD as Consulting Physician (Cardiology) Darlean Ozell NOVAK, MD as Consulting Physician (Pulmonary Disease) System, Provider Not In  Patient Active Problem List   Diagnosis Date Noted   Heart failure ([name]) 10/11/2023   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) and diastolic (congestive) heart failure ([name]) 10/10/2023   Pre-ulcerative calluses 04/25/2022   COPD (chronic obstructive pulmonary disease) ([name]) 05/05/2021   OSA (obstructive sleep apnea) 05/05/2021   Stage 3b chronic kidney disease ([name]) 05/05/2021   Pressure injury of skin 05/04/2021   Diabetic nephropathy ([name]) 01/02/2021   Low back pain 06/01/2020   Hyperlipidemia  04/03/2020   PAD (peripheral artery disease) ([name]) 12/26/2019   NICM (nonischemic cardiomyopathy) ([name]) 06/20/2019   Non-healing ulcer ([name]) 06/20/2019   Coagulation disorder ([name]) 08/09/2017   Depression 07/21/2017   At risk for adverse drug reaction 06/20/2017   Peripheral neuropathy 06/20/2017   S/P transmetatarsal amputation of foot, right ([name]) 06/05/2017   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) 05/19/2017   Obesity, class 2 02/24/2016   Encounter for therapeutic drug monitoring 02/10/2016   Symptomatic bradycardia 01/12/2016   Essential hypertension 12/22/2015   Elevated troponin    Acute on chronic diastolic CHF (congestive heart failure) ([name])    Anemia of chronic disease 11/08/2015   Hypokalemia 11/08/2015   Tobacco abuse 10/23/2015   Coronary artery disease    DOE (dyspnea on exertion) 04/29/2015   PAF (paroxysmal atrial fibrillation) ([name]) 01/16/2015   Carotid arterial disease ([name]) 01/16/2015   Insomnia 02/03/2014   S/P peripheral artery angioplasty - TurboHawk atherectomy; R SFA 09/11/2013    Class: Acute   Leg pain, bilateral 08/19/2013   Hypothyroidism 07/31/2013   History of cocaine abuse ([name]) 06/13/2013   Long term current use of anticoagulant therapy 05/20/2013   Alcohol abuse    Narcotic abuse ([name])    Marijuana abuse    Alcoholic cirrhosis ([name])    Type 2 diabetes mellitus with hyperlipidemia ([name])     Current Outpatient Medications:    ACCU-CHEK GUIDE test strip, USE TO CHECK BLOOD  SUGAR FOUR TIMES DAILY, Disp: , Rfl:    acetaminophen  (TYLENOL ) 500 MG tablet, Take 1,000 mg by mouth every 6 (six) hours as needed for headache (pain)., Disp: , Rfl:    albuterol  (VENTOLIN  HFA) 108 (90 Base) MCG/ACT inhaler, USE 2 PUFFS BY MOUTH EVERY FOUR HOURS, AS NEEDED, FOR COUGHING/WHEEZING, Disp: 8.5 g, Rfl: 0   allopurinol  (ZYLOPRIM ) 100 MG tablet, Take 150 mg by mouth daily., Disp: , Rfl:    apixaban   (ELIQUIS ) 5 MG TABS tablet, Take 1 tablet (5 mg total) by mouth 2 (two) times daily., Disp: 60 tablet, Rfl: 6   budesonide -formoterol  (SYMBICORT ) 160-4.5 MCG/ACT inhaler, Inhale 2 puffs into the lungs 2 (two) times daily., Disp: 1 each, Rfl: 2   buPROPion  ER (WELLBUTRIN  SR) 100 MG 12 hr tablet, Take 1 tablet (100 mg total) by mouth daily., Disp: 30 tablet, Rfl: 6   Cholecalciferol  (VITAMIN D3 PO), Take 1 capsule by mouth every morning., Disp: , Rfl:    colchicine  0.6 MG tablet, Take 1.2 mg by mouth See admin instructions. Take 1.2 mg for flair up and an hour later take 0.6 mg if needed, Disp: , Rfl:    insulin  lispro (HUMALOG ) 100 UNIT/ML KwikPen, Inject 10 Units into the skin 3 (three) times daily with meals., Disp: 15 mL, Rfl: 0   LANTUS  SOLOSTAR 100 UNIT/ML Solostar Pen, Inject 37 Units into the skin daily. (Patient taking differently: Inject 40 Units into the skin daily.), Disp: 15 mL, Rfl: 0   levothyroxine  (SYNTHROID ) 50 MCG tablet, TAKE 1 TABLET (50 MCG TOTAL) BY MOUTH DAILY BEFORE BREAKFAST (AM), Disp: 30 tablet, Rfl: 0   metolazone  (ZAROXOLYN ) 2.5 MG tablet, Take 1 tablet (2.5 mg total) by mouth once a week. Every Monday, Disp: 15 tablet, Rfl: 3   metoprolol  succinate (TOPROL -XL) 25 MG 24 hr tablet, Take 1 tablet (25 mg total) by mouth daily., Disp: 90 tablet, Rfl: 3   pantoprazole  (PROTONIX ) 40 MG tablet, TAKE 1 TABLET (40 MG TOTAL) BY MOUTH DAILY(AM), Disp: 30 tablet, Rfl: 0   potassium chloride  SA (KLOR-CON  M) 20 MEQ tablet, Take 2 tablets (40 mEq total) by mouth once a week. With every dose of metolazone , Disp: , Rfl:    rosuvastatin  (CRESTOR ) 40 MG tablet, TAKE ONE TABLET (40 MG TOTAL) BY MOUTH DAILY AT 9PM AT BEDTIME, Disp: 15 tablet, Rfl: 0   torsemide  (DEMADEX ) 100 MG tablet, Take 1 tablet (100 mg total) by mouth 2 (two) times daily., Disp: 180 tablet, Rfl: 3   triamcinolone  ointment (KENALOG ) 0.1 %, Apply topically 2 (two) times daily., Disp: 454 g, Rfl: 0   VITAMIN E PO, Take 1  tablet by mouth daily., Disp: , Rfl:    doxycycline  (VIBRAMYCIN ) 100 MG capsule, Take 1 capsule (100 mg total) by mouth 2 (two) times daily. (Patient not taking: Reported on 11/06/2023), Disp: 20 capsule, Rfl: 0   EASY COMFORT PEN NEEDLES 31G X 5 MM MISC, USE FOUR TIMES A DAY FOR INSULIN  ADMINISTRATION, Disp: 100 each, Rfl: 1   fluticasone  (FLONASE ) 50 MCG/ACT nasal spray, Place 1 spray into both nostrils as needed for allergies or rhinitis. (Patient not taking: Reported on 11/21/2023), Disp: , Rfl:    Furosemide  (FUROSCIX  [place]), Inject into the skin as directed. As needed (Patient not taking: Reported on 11/21/2023), Disp: , Rfl:    losartan  (COZAAR ) 25 MG tablet, Take 1 tablet (25 mg total) by mouth daily., Disp: 30 tablet, Rfl: 0   polyethylene glycol (MIRALAX  / GLYCOLAX ) 17 g packet,  Take 17 g by mouth daily as needed for moderate constipation., Disp: 14 each, Rfl: 0   spironolactone  (ALDACTONE ) 25 MG tablet, Take 1 tablet (25 mg total) by mouth daily., Disp: 90 tablet, Rfl: 3 Allergies  Allergen Reactions   Gabapentin  Nausea And Vomiting and Other (See Comments)    POSSIBLE SHAKING   Lyrica  [Pregabalin ] Other (See Comments)    Shaking       Social History   Socioeconomic History   Marital status: Single    Spouse name: Not on file   Number of children: 1   Years of education: 12   Highest education level: 12th grade  Occupational History   Occupation: disabled  Tobacco Use   Smoking status: Every Day    Current packs/day: 1.00    Average packs/day: 1 pack/day for 44.0 years (44.0 ttl pk-yrs)    Types: E-cigarettes, Cigarettes   Smokeless tobacco: Former    Types: Snuff  Vaping Use   Vaping status: Former   Devices: 11/26/2018 stopped months ago  Substance and Sexual Activity   Alcohol use: Not Currently    Comment: occ   Drug use: Not Currently    Types: Crack cocaine, Marijuana, Oxycodone    Sexual activity: Not Currently  Other Topics Concern   Not on file  Social  History Narrative   ** Merged History Encounter **       Lives in [place], in motel with sister.  They are looking to move but don't have a place to go yet.     Social Drivers of Corporate Investment Banker Strain: Low Risk  (02/09/2023)   Overall Financial Resource Strain (CARDIA)    Difficulty of Paying Living Expenses: Not hard at all  Food Insecurity: No Food Insecurity (10/10/2023)   Hunger Vital Sign    Worried About Running Out of Food in the Last Year: Never true    Ran Out of Food in the Last Year: Never true  Transportation Needs: No Transportation Needs (10/10/2023)   PRAPARE - Administrator, Civil Service (Medical): No    Lack of Transportation (Non-Medical): No  Physical Activity: Inactive (02/09/2023)   Exercise Vital Sign    Days of Exercise per Week: 0 days    Minutes of Exercise per Session: 0 min  Stress: No Stress Concern Present (02/09/2023)   [employer] of Occupational Health - Occupational Stress Questionnaire    Feeling of Stress : Not at all  Social Connections: Unknown (08/21/2023)   Received from [facility]   Social Network    Social Network: Not on file  Intimate Partner Violence: Not At Risk (10/10/2023)   Humiliation, Afraid, Rape, and Kick questionnaire    Fear of Current or Ex-Partner: No    Emotionally Abused: No    Physically Abused: No    Sexually Abused: No    Physical Exam      Future Appointments  Date Time Provider Department Center  11/27/2023  3:30 PM Rosan Harlene Fickle, DO [facility]  12/07/2023 11:30 AM [facility] PA/NP [facility] None  12/12/2023 12:00 PM Inocencio Soyla Lunger, MD [facility]  02/12/2024  4:00 PM [facility] ANNUAL WELLNESS VISIT [facility] None  04/09/2024  3:00 PM [facility] VASC 4 [facility]  04/09/2024  4:00 PM Magda Debby SAILOR, MD [facility]       [name], Paramedic [phone] [facility] Paramedic  11/21/23

## 2023-11-21 NOTE — Telephone Encounter (Signed)
 I reached out to pt multiple times today to see what time I can come out, but no answer. I tried calling her sister also, no answer and no call back.   Kerry Hough, EMT-Paramedic  [phone] 11-20-2023

## 2023-11-22 ENCOUNTER — Other Ambulatory Visit: Payer: Self-pay | Admitting: Physician Assistant

## 2023-11-23 NOTE — Progress Notes (Signed)
 Karla, Price ([id number]) [id number] Page 1 of 15 Visit Report for 11/20/2023 Chief Complaint Document Details Patient Name: Date of Service: [name] 11/20/2023 1:15 PM Medical Record Number: [id number] Patient Account Number: [id number] Date of Birth/Sex: Treating RN: 1961-06-15 (63 y.o. F) Primary Care Provider: Delbert Clam Other Clinician: Referring Provider: Treating Provider/Extender: Rosan Harlene Delbert Clam Weeks in Price: 30 Information Obtained from: Patient Chief Complaint Bilateral LE Ulcers Electronic Signature(s) Signed: 11/22/2023 5:26:35 PM By: Rosan Harlene DO Entered By: Rosan Harlene on 11/20/2023 16:38:49 -------------------------------------------------------------------------------- Debridement Details Patient Name: Date of Service: Karla TOI, DO Karla S. 11/20/2023 1:15 PM Medical Record Number: [id number] Patient Account Number: [id number] Date of Birth/Sex: Treating RN: [birth year] (63 y.o. [name] Cammie Sailors Primary Care Provider: Delbert Clam Other Clinician: Referring Provider: Treating Provider/Extender: Rosan Harlene Delbert Clam Weeks in Price: 30 Debridement Performed for Assessment: Wound #23 Right Metatarsal head first Performed By: Physician Rosan Harlene, DO The following information was scribed by: Cammie Sailors The information was scribed for: Rosan Harlene Debridement Type: Debridement Severity of Tissue Pre Debridement: Fat layer exposed Level of Consciousness (Pre-procedure): Awake and Alert Pre-procedure Verification/Time Out Yes - 14:45 Taken: Start Time: 14:45 Pain Control: Lidocaine  4% T opical Solution Percent of Wound Bed Debrided: 100% T Area Debrided (cm): otal 0.05 Tissue and other material debrided: Non-Viable, Slough, Subcutaneous, Slough Level: Skin/Subcutaneous Tissue Debridement Description: Excisional Instrument: Curette Bleeding: Minimum Hemostasis  Achieved: Pressure Response to Price: Procedure was tolerated well Level of Consciousness (Post- Awake and Alert procedure): Post Debridement Measurements of Total Wound Length: (cm) 0.2 Width: (cm) 0.3 Depth: (cm) 0.2 Volume: (cm) 0.009 Character of Wound/Ulcer Post Debridement: Improved Severity of Tissue Post Debridement: Fat layer exposed [name] ([id number]) [id number] Page 2 of 15 Post Procedure Diagnosis Same as Pre-procedure Electronic Signature(s) Signed: 11/20/2023 5:26:20 PM By: Cammie Sailors RN, BSN Signed: 11/22/2023 5:26:35 PM By: Rosan Harlene DO Entered By: Cammie Sailors on 11/20/2023 14:47:49 -------------------------------------------------------------------------------- HPI Details Patient Name: Date of Service: Karla TOI, DO Karla S. 11/20/2023 1:15 PM Medical Record Number: [id number] Patient Account Number: [id number] Date of Birth/Sex: Treating RN: 01/28/1961 (63 y.o. F) Primary Care Provider: Delbert Clam Other Clinician: Referring Provider: Treating Provider/Extender: [name], [name]: 30 History of Present Illness HPI Description: This 63 year old patient who has a very long significant history of diabetes mellitus, previous alcohol and nicotine  abuse, chronic data disease, COPD, diabetes mellitus, height hypertension, critical lower limb ischemia with several wounds being managed at the wound Center at [facility] for over a year. She was recently in the ER at [facility] and was referred to our center. He has had a long history of critical limb ischemia and over a period of time has had balloon angioplasties in March 2016, endarterectomy of the left carotid, lower extremity angiogram and Price by Dr. Dorn Lesches, several cardiac catheterizations. Most recently she had an x-ray of her left foot while in the ER which showed no acute abnormality. during this ER visit she  was started on ciprofloxacin  and asked to continue with the wound care physicians at [facility]. Her last ABI done in June 2017 showed the right side to be 0.28 in the left side to be 0.48. Her right toe brachial index was 0.17 on the right and 0.27 on the left. her last hemoglobin A1c was 11.1% She continues to smoke about a pack of cigarettes a day. 03/28/2017 -- -- right foot x-ray -- IMPRESSION:Areas of soft tissue  swelling. Mild subluxation second PIP joint. No frank dislocation or fracture. No erosive change or bony destruction. No soft tissue ulceration or radiopaque foreign body evident by radiography. There is plantar fascia calcification with a nearby inferior calcaneal spur. There are foci of arterial vascular calcification. 04/04/2017 -- the patient continues to be noncompliant and continues to complain of a lot of pain and has not done anything about quitting smoking Readmission: 06/26/18 on evaluation today patient presents for reevaluation she has not been seen in our office since May 2018. Since she was last seen here in our office she has undergone testing at Dr. Dorn [place] office where it was revealed that she had an ABI of 0.68 with her previous ABI being 0.64 and a left ABI of 0.38 with previous ABI being 0.34 this study which was performed on 04/05/18 was pretty much equivalent to the study performed on 11/22/17. The findings in the end revealed on the right would appear to be moderate right lower extremity arterial disease on the left Dr. Court states moderate in the report but unfortunately this appears to be much more severe at 0.38 compared to the right. Subsequently the patient was scheduled to have angiography with Dr. Court on 04/12/18. This was however canceled due to the fact that the patient was found to have chronic kidney disease stage III and it was to the point that he did not feel that it will be safe to pursue angiography at that point. She has not been on any  antibiotics recently. At this point Dr. Court has not rescheduled anything as far as the injured [place] according to the patient he is somewhat reluctant to do so. Nonetheless with her diminished blood flow this is gonna make it somewhat difficult for her to heal. 07/05/18; this is a patient I have not seen previously. She has very significant PAD as noted above. She apparently has had revascularization efforts by Dr. Court on the right on 3 occasions to the patient. She was supposed to have an attempted angiography on the left however this was canceled apparently because of stage III chronic renal failure. I will need to research all of this. She complains of significant pain in the wound and has claudication enough that when she walks to the end of the driveway she has to stop. She is been using silver  alginate to the wounds on her legs and Iodoflex to the area on her left second toe. 07/13/18 on evaluation today patient's wounds actually appear to be doing about the same. She has an appointment she tells me within the next month that is September 2019 with Dr. Court to discuss options to see if there's anything else that you can recommend or do for her. Nonetheless obviously what we're trying to do is do what we can to save her leg and in turn prevent any additional worsening or damage. None in the meantime we been mainly trying to manage her ulcers as best we can. 07/27/18; some improvement in the multiplicity of wounds on her left lower extremity and foot. She's been using silver  alginate. I was unable to determine that she actually has an appointment with Dr. Court. We are checking into this The patient's wound includes Left lateral foot, left plantar heel, her left anterior calf wound looks close to me, left fourth toe is very close to closed and the left medial calf is perhaps the largest open area READMISSION 02/19/2021 This is a now 63 year old woman with type 2 diabetes and  continued  cigarette smoking. She has known PAD. She was last in this clinic in 2019 at that point with wounds on her left foot. She left in a nonhealed state. On 06/28/2019 I see she had a left femoropopliteal by vein and vascular with an amputation of the fifth ray. These managed to heal over. Her last arterial studies were in February 2021. This showed an absent waveform at the posterior tibial artery on the right a dampened monophasic dorsalis pedis on the right of 0.44. On the left again the PTA TA was absent her dorsalis pedis was 0.99 triphasic and her great toe pressure was 0.65. This would have been after her revascularization. Once again she tells me that Dr. Court has done revascularization on the right leg on 3 different occasions. She has a right transmetatarsal amputation apparently done by Dr. Harden remotely but I do not see a note for these right lower extremity revascularization but I may not of looked back far enough. Karla, Price ([id number]) [id number] Page 3 of 15 In any case she says that the she has a wound on the plantar aspect of the right transmetatarsal amputation site there is been there for several months. More recently she fell and had a wound on her right medial lower leg she had 5 sutures placed in the ER and then subsequently she has developed an area on the medial lower leg which was a blister that opened up. Past medical history includes type 2 diabetes, PAD, chronic renal failure, congestive heart failure, right transmetatarsal amputation, left fifth ray amputation, continued tobacco abuse, atrial fibrillation and cirrhosis. We did not attempt an ABI on the right leg today because of pain 02/26/2021; patient we admitted to the clinic last week. She has what looks to be 2 areas on her right medial and right anterior lower leg that look more like venous wounds but she has 1 on the first met head at the base of her right transmet. She has known severe  PAD. She complains of a lot of pain although some of this may be neuropathic. Is difficult to exclude a component of claudication. Use silver  alginate on the wounds on her legs and Iodoflex on the area on the first met head. She has an appointment with Dr. Court on 5/25 although I will text him and discussed the situation. She is have poorly controlled diabetic. She has has stage IIIb chronic renal failure 4/22; patient presents for 1 week follow-up. The 2 areas on her right medial and right anterior lower leg appear well-healing. She has been using silver  alginate to this area without issues. She has a first met head ulcer and it is unclear how long this has been there as it was discovered in the ED earlier this month when she was being evaluated for another issue. Iodoflex has been used at this area. 4/29; patient presents for 1 week follow-up. She has been using silver  alginate to the leg wounds and Iodoflex to the plantar foot wound. She has had this wrapped with Coban and Kerlix. She has no concerns or complaints today. 5/6; this is a difficult wound on the right plantar foot transmit site. We are not making a lot of progress. She had 2 more venous looking wounds on the right medial and right anterior lower leg 1 of which is healed. Her appointment with Dr. Court is on 5/25 5/16; she did not tolerate the offloading shoe we gave her in fact she had a fall with an  abrasion on her right forearm she is now back in the modified small shoe which does not offload her foot properly. Her appoint with Dr. Court is still on 5/25 we have been using Iodoflex. The area on the right anterior lower leg is healed 7/18; patient presents for follow-up however has not been seen in 2 months. She was last seen at the end of May. She had a fall at the end of June and was hospitalized. She has been unable to follow-up since then. For the wound she has only been keeping it covered with gauze. She is scheduled for an  aortogram with lower extremity runoff at the end of July. 7/29; patient presents for 2-week follow-up. She has home health and they have been changing her dressings. She denies any issues and has no complaints today. She states she had a procedure where they opened up one of her vessels in her legs. She denies signs of infection. 8/5; patient presents for follow-up. She was recently in the hospital for bradycardia. She was noted to have cellulitis to the left lower extremity and Unna boot was placed due to blisters and increased swelling. She reports improvement to her left leg since being in the hospital and stability check her right plantar foot wound. She denies signs of infection. 8/23; since I last saw this patient a lot has happened. She still has the area over the right first met head in the setting of previous transmetatarsal amputation. Firstly most importantly she underwent revascularization of her occluded right SFA. She underwent directional atherectomy followed by drug-coated balloon angioplasty. She has no named vessel below the knee hopefully the revascularization of the right SFA will improve collateral flow. It is not felt however that she has any endovascular options. She had follow-up arterial studies noninvasive on 8/9. These showed an ABI of 0.44 at the right PTA. Monophasic waveforms. On the left her great toe ABI was 0.68. Her follow-up arterial Doppler showed a 50 to 74% stenosis in the proximal SFA and a 50 to 74% stenosis in the distal SFA mild to severe atherosclerosis noted throughout the extremity. Areas of shadowing plaque seen, unable to rule out higher grade stenosis she also had a fall apparently wearing a right foot forefoot off loader that we gave her. She therefore comes in in an ordinary running shoe today. Not certain if this is the fall that ended up in the hospital with bradycardia 9/6 area over the right first metatarsal head in the setting of her previous  amputation and severe PAD. She is also a diabetic with known PAD status post attempt at revascularization. She is continued smoker Again the separation of visits in the clinic is somewhat disturbing if we are going to consider her for a total contact cast. She is not wearing anything to offload this stating it causes imbalance especially the forefoot off loader. She basically comes in in bedroom slippers She also had a fall this morning about an hour ago. She has a skin tear on the left dorsal forearm 9/13; areas over the right first metatarsal head in the setting of previous TMA and severe PAD. Again she comes in here in slippers. We have been using Hydrofera Blue. We use MolecuLight on this which was essentially negative study 9/20; right first metatarsal head in the setting of her previous TMA and severe PAD. Same slipper type shoes. She says she cannot wear a forefoot off loader and for some reason she will not wear surgical shoes. She  says she is smoking half a pack per day. 9/30; right first metatarsal head. Absolutely no improvement in fact there is tunneling superiorly very close to bone. She does not offload this properly at all. We use MolecuLight on this 2 weeks ago that did not show any surface bacteria we have been using silver  collagen is a dressing She tells me she is down to 3 cigarettes a day I have offered encouragement and a Price plan 10/6; right first metatarsal head. Exposed bone this week which is not surprising. We have been using silver  collagen. She is smoking 5 to 10 cigarettes a day 10/17; she has not been here in 10 days. The bone scraping that I did on 10/6 showed staph lugdunensis, staph epidermidis and Pseudomonas Alcalifenes. I put in for Septra  DS 1 p.o. twice daily for 14 days last week would be but we could not reach her to start the antibiotic. Quinolones would have been a good alternative except she has multiple drug interactions. Septra  would not cover what  ever the Pseudomonas is but I am not really sure of the relevance here. I am going to try her on the Septra  for 2 weeks. She has a probing wound on the right TMA that probes to bone. She claims to have just about stop smoking I reviewed her arterial status. She underwent a left fem-tib bypass by Dr. Oris in 06/28/2019. Her last angiogram was in July of this year by Dr. Court. This showed an occluded right SFA the popliteal and tibial vessels were also occluded the peroneal vessel filled by collaterals and filled the PT and DP by communicating collaterals. She was not felt to have any additional and vascular options. Dr. Court noted that she he had previously revascularized her right SFA 3 times. 10/25; she is tolerating the Septra  but says it makes her nauseated. I am going to continue this for an additional 2 weeks perhaps for a total of 6 weeks. Her wound does not look too much different. There is on the right first met metatarsal head. There is probing areas around the tissue that is in the middle of the wound. There is no purulence I cannot feel bone. The original source was for a bone scraping. 11/8; right first metatarsal head. This does not probe to bone and there is no purulence. As far as I can tell she is completed the Septra  although there were problems with exact length of time and I think compliance. In any case I gave her 6 weeks. I have reviewed her PAD above. 11/15; right first metatarsal head. Again protruding subcutaneous tissue but this does not have any adherence to surrounding skin. Undermining circumferentially. There is no palpable bone. Not grossly purulent. We have been using Iodoflex to try to get some adherence to clean off the surface but no real improvement. I do not think they actually had enough supplies listening to the patient. She completed the 6 weeks of Septra  I gave her, but I am not sure about the compliance with the dates i.e. may have missed days taken 1 a day  etc. The patient has a vascular follow-up with Dr. Court this coming Friday i.e. in 3 days. By my read of arterial evaluations I do not think she is felt to be a candidate for any further revascularization. The last angiogram was in July. 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 She continues to smoke now up to 10 cigarettes a day. She is not in  any pain. Wound has not improved but is certainly not worsened. I gave her 6 weeks of trimethoprim /sulfamethoxazole  which she is completed in some fashion. 11/29 right first metatarsal head previous TMA. Wound actually looks somewhat better today still a lot of callus around the wound on debris on the wound surface. UNFORTUNATELY the patient missed her appointment with Dr. Court and is not rebooked apparently until sometime in February Karla, Price ([id number]) [id number] Page 4 of 15 12/13; right first metatarsal wound actually looks some smaller today. I did not debride this today. She is using Iodoflex. When she came in last week she had a large abscess on her left anterior lower leg apparently after falling we send her to the ER to have this drained which was done. I cannot see any cultures x-ray was negative. She was apparently given 2 doses of IV antibiotics and discharged on Bactrim  DS twice daily which she is still taking. As mentioned I cannot see any cultures. 12/29; culture of the abscess on the left leg I did last time showed Serratia. We gave her antibiotics. I note she was in the ER next day with bleeding which they controlled she is on anticoagulants.She comes in today to clinic and the area on her right plantar foot is miraculously very hyperkeratotic but healed 02/06/2023 Ms. 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 She reports a callus to the right first met head and on the  lateral aspect of the left foot. She saw podiatry 3 days ago and had the callus debrided. There are no open wounds at this time. She follows up with podiatry in 2 weeks. Readmission: 04-19-2023 upon evaluation today patient appears to be doing well all things considered in regard to her wounds. She does have several wounds however on her feet 1 on the left foot centrally along the lateral edge and then a wound on the right lower leg more posterior. On the right foot plantar distal at the transmetatarsal amputation site this is the worst. With that being said she tells me this has been going on for quite some time she has been seen by podiatry they have done some debridements in the past. With that being said based on what we are seeing I believe that the patient's primary issue on the right side is that she is having some issues here with her arterial flow. The ABI on the right is 0.57 on the left is 1.04. With that being said this reading on the right is getting necessitated further evaluation by vascular. She tells me she has been seen by Dr. Wadie in the past and he stated there was nothing further to be done for the right leg. With that being said I think that it would be good to have a second opinion with vascular on this. Patient does have a history of diabetes mellitus type 2, peripheral vascular disease, and hypertension. The previous ABIs were performed on January 11, 2023. 04-26-2023 upon evaluation today patient appears to be doing about the same in regard to her wounds. She still has not had the arterial study at this point. I think this is something needs to be done as quickly as possible. Fortunately I do not see any evidence of active infection locally or systemically which is great news. No fevers, chills, nausea, vomiting, or diarrhea. 7/24; the patient has not been here  in about a month and a half. She has open wounds on the plantar right foot second metatarsal head in the setting of  a previous TMA, she has a new area on the left lateral foot and perhaps traumatic areas on the right anterior and left lateral lower legs. She has poorly controlled edema. The patient has known severe PAD has had multiple interventions in the right SFA. 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 She had an pressure of 60 at the dorsalis pedis giving her an ABI of 0.57 monophasic waveforms here. On the left her study was a lot better with an ABI 1.04 triphasic waveforms. The ABI might be falsely elevated on the left. I think at her last angiogram she had occluded right SFA popliteal and tibial vessels with the peroneal vessel being the only vessel feeding her foot via collaterals. She comes in this time in basic over-the-counter shoes 06-14-2023 upon evaluation today patient appears to be doing well currently in regard to her wounds all things considered except for the left lateral foot where she has what appears to be a blister this is erythematous as well and concerned about infection. I discussed with her today that I do think we need to see about doing the Vi core culture and subsequently depending on the results of this we will initiate antibiotic Price as necessary. With that being said right now I think that she is going to need to be aggressive and elevating her legs she has an appointment with vascular coming up around 3 August is what I believe her appointment date is. Nonetheless once we get clearance from the standpoint of vascular she does have good blood flow and we can subsequently try to see what we do about getting her edema under much better control and then she really needs some much stronger compression than what we currently are able to do. 06-21-2023 upon evaluation today patient appears to be doing pretty well currently in regard to her wounds most of which seem to be  showing signs of improvement. She has been require little bit of debridement in regard to the right plantar foot wound I will do this as carefully as possible as we are still working on establishing that she has sufficient blood flow at this point. This right side on previous testing seem to be somewhat questionable. Fortunately I do not see any signs of active infection locally nor systemically at this time. 06-28-2023 patient does have an appointment with vascular on 07-11-2023. With that being said right now she has been having some slow progress but still nonetheless good progress at this point. I am actually very pleased with where we stand. I do not see any signs of active infection locally or systemically which is great news. No fevers, chills, nausea, vomiting, or diarrhea. 07-05-2023 upon evaluation today patient appears to be doing well currently in regard to her wound. She has been tolerating the dressing changes. She only has 1 wound left on the plantar aspect of the right foot. With that being said she does have a peg assist offloading shoe she has been using at this time. 8/28; patient with a wound over the plantar right foot metatarsal head. She was seen by Dr. Magda of vascular surgery. Noted that the right ABI was only 0.57 it was felt that she had progression versus a previous study. She had  some stenosis in the SFA a 7599% stenosis in the proximal mid SFA mild stenosis in the distal SFA and mild stenosis in the proximal PTA. On the left she had a patent left femoral to ATA bypass graft with mild stenosis It was recommended that she undergo angiography for limb salvage on the right. That is being arranged as we speak. Her wound is on the right first metatarsal head she has been using collagen as the primary dressing. She tells me she is making every effort to stop smoking 07-19-2023 upon evaluation today patient appears to be doing okay in regard to her foot ulcer although her arterial  procedure/revascularization keeps getting pushed back. She states that they are talking about pushing back and left nail due to the fact that she changed insurances and I do not have a being updated insurance card number. With that being said I explained to the patient that should be something that she should be able to get on line easily by making an account. She is also called [employer] and they said they were sending it but it would be about a week before described by next Wednesday. She really does not have time to be holding on this she needs to be proceeding with intervention as quickly as possible. 07-26-2023 upon evaluation today patient appears to be doing well currently in regard to her wound. This is actually showing signs of looking about the same I did clear a lot of callus up last week this actually looks better to me from an overall visual standpoint than last week though it is a little larger because of the callus I had removed. With that being said I feel like she is actually making really good progress here towards complete closure. No fevers, chills, nausea, vomiting, or diarrhea. 08-09-2023 upon evaluation today patient presents for follow-up concerning her ongoing issues with her right plantar foot ulcer. She is going to be having arteriogram on Friday that is just just 2 days and hopefully following this it will actually allow for more aggressive healing in regard to the right plantar foot. Today I am going to perform some debridement to clear away some of the necrotic debris. 08-23-2023 upon evaluation patient's wound really appears to be doing about the same. She did have her arteriogram with Dr. Magda and that was on 9-27- 2024. Unfortunately it was noted that the patient had no good options for further revascularization. They plan to reevaluate there in a month but she is stated to be at high risk for major amputation. With that being said it does appear they were  able to do stenting of the right femoral-popliteal artery but unfortunately they were unable to cross the TP trunk and proximal peroneal occlusion. Subsequently this means that there is really not good blood flow into the ankle and foot which means that the patient really does not have a good chance of getting this to heal. 08-30-2023 upon evaluation today patient appears to be doing well currently in regard to her wound. She has been tolerating the dressing changes without LUCIANNA, OSTLUND ([id number]) [id number] Page 5 of 15 complication and in general I do feel that the patient is doing quite well at this time. Unfortunately with the blood flow being so low and really no real vascular options to improve blood flow she really has to try to stay off of this is much as possible. 09-06-2023 upon evaluation today patient appears to be doing well currently in regard to  the wound on her plantar foot it does not seem to be any worse is not terribly better but again there is really not much we can do from a blood flow standpoint at this time unfortunately. There does not appear to be any signs of active infection locally or systemically which is good news. She does have a wound on the posterior ankle location and the right leg as well. She also has an area on the left lateral foot. 09-13-2023 upon evaluation today patient appears to be doing decently well all things considered in regard to her wounds. Fortunately I do not see any signs of worsening overall I do believe that the patient is making good headway towards closure which is good news. No fevers, chills, nausea, vomiting, or diarrhea. 11/20; since the patient was last here she was admitted to hospital from 11/9 through 09/27/2023. She had atrial fibrillation with rapid ventricular response she underwent successful cardioversion to normal sinus rhythm. She has noted to have stage III chronic renal failure, heart failure with  preserved ejection fraction. She was discharged home apparently with a furosemide  infusion pump although I do not see that on her discharge summary. She is listed as having torsemide  to 100 mg/day. Unfortunately in any case she is gaining weight quite substantially per description of the patient. She has significant bilateral lower extremity edema but nonpitting. She has no open wounds on the left leg and right leg. She was not discharged with any home health and I am not really clear anybody is dressing these wounds. She has known PAD She has the original wounds on the right first met head and right posterior Achilles area. New wounds on the right anteriorly and right posteriorly and on the left laterally and posteriorly. All her wounds are draining clear edema fluid 12/5; since the patient was last here she was admitted to hospital from 11/26 through 11/29. She had acute on chronic diastolic congestive heart failure. She was also in the ER on 12/3 with a small abscess in her right buttock. She was put on doxycycline . She had her legs wrapped when she left the hospital but she took these off 2-3 deep days ago. There is very significant bilateral lower extremity edema which she is tense. She has wounds on her right first metatarsal head right anterior lower extremity right posterior lower extremity x 2 and her left lateral and left posterior lower extremity. The areas on the lower extremity looked mostly like venous wounds although her complicating systemic fluid volume overload is no doubt contributing. We have been using silver  alginate and Kerlix Coban. As mentioned she took these off a few days ago The patient saw Dr. Magda on 10/10/2023. She is status post right femoral-popliteal angioplasty and stenting 12/12; patient with wounds on the right greater than left lower leg. She arrives in clinic today with the edema control much better even though we just use kerlix Coban. I do not know what dose  of diuretic she is on. She has wounds on the right posterior leg right lateral leg and more superficial areas on the left lateral leg. She also has areas on the right first metatarsal head. The patient has significant PAD status post right femoropopliteal angioplasty and stenting. Also chronic venous insufficiency 11/20/2023; patient presents to the clinic for follow-up. She has a wound to the right first met head. She just recently developed a skin tear to the dorsal hand. She has also developed skin breakdown to the right buttocks over  the past week. She has been using Neosporin to the new wound beds. The right Lower extremity has been placed in compression and silver  alginate used to the foot wound. she currently denies signs of infection. She has no issues or complaints today. Electronic Signature(s) Signed: 11/22/2023 5:26:35 PM By: Rosan Raisin DO Entered By: Rosan Raisin on 11/20/2023 16:42:48 -------------------------------------------------------------------------------- Physical Exam Details Patient Name: Date of Service: Karla KNIGHTS, DO Karla S. 11/20/2023 1:15 PM Medical Record Number: [id number] Patient Account Number: [id number] Date of Birth/Sex: Treating RN: 1961/07/11 (64 y.o. F) Primary Care Provider: Delbert Clam Other Clinician: Referring Provider: Treating Provider/Extender: Rosan Raisin Delbert Clam Weeks in Price: 30 Constitutional respirations regular, non-labored and within target range for patient.. Cardiovascular 2+ dorsalis pedis/posterior tibialis pulses. Psychiatric pleasant and cooperative. Notes T the plantar aspect of the right foot there is an open wound with granulation tissue and nonviable tissue. Minimal scattered areas with superficial breakdown to o the right leg. No open wounds to the left lower extremity. Skin tear to the left dorsal hand with granulation tissue. Skin breakdown to the right gluteus. Epithelialized skin to the left  gluteus previous wound sites. No signs of infection including increased warmth, erythema or purulent drainage from any of the wound beds. Electronic Signature(s) Signed: 11/22/2023 5:26:35 PM By: Rosan Raisin ROSALEA Karla DARALYN GORMAN ([id number]) [id number] Page 6 of 15 Entered By: Rosan Raisin on 11/20/2023 16:49:46 -------------------------------------------------------------------------------- Physician Orders Details Patient Name: Date of Service: Karla KNIGHTS ROSALEA SHERWOOD Price 11/20/2023 1:15 PM Medical Record Number: [id number] Patient Account Number: [id number] Date of Birth/Sex: Treating RN: 02/08/1961 (63 y.o. [name] Cammie Sailors Primary Care Provider: Delbert Clam Other Clinician: Referring Provider: Treating Provider/Extender: Rosan Raisin Delbert Clam Weeks in Price: 30 Verbal / Phone Orders: No Diagnosis Coding ICD-10 Coding Code Description 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 L97.522 Non-pressure chronic ulcer of other part of left foot with fat layer exposed E11.622 Type 2 diabetes mellitus with other skin ulcer L97.812 Non-pressure chronic ulcer of other part of right lower leg with fat layer exposed I73.89 Other specified peripheral vascular diseases I87.331 Chronic venous hypertension (idiopathic) with ulcer and inflammation of right lower extremity L97.828 Non-pressure chronic ulcer of other part of left lower leg with other specified severity L89.322 Pressure ulcer of left buttock, stage 2 L89.312 Pressure ulcer of right buttock, stage 2 S60.512A Abrasion of left hand, initial encounter Follow-up Appointments Return appointment in 3 weeks. - Dr Dwan Hemmelgarn Anesthetic (In clinic) Topical Lidocaine  5% applied to wound bed Bathing/ Shower/ Hygiene May shower with protection but do not get wound dressing(s) wet. Protect dressing(s) with water   repellant cover (for example, large plastic bag) or a cast cover and may then take shower. Edema Control - Orders / Instructions Elevate legs to the level of the heart or above for 30 minutes daily and/or when sitting for 3-4 times a day throughout the day. Avoid standing for long periods of time. Off-Loading Open toe surgical shoe to: - with Peg Assist in shoe. Limit walking on foot. Stay off the foot to minimize pressure to bottom of foot. Additional Orders / Instructions Stop/Decrease Smoking Follow Nutritious Diet Non Wound Condition Left Lower Extremity pply the following to affected area as directed: - left foot- apply betadine to affected area cover with ABD pad, secure with kerlix tubigrip size E. A Home Health Wound #23 Right Metatarsal head first No change in wound care orders  this week; continue Home Health for wound care. May utilize formulary equivalent dressing for wound Price orders unless otherwise specified. - Silver  alginate to lower leg wounds, cover with ABD, do not use xeroform or Vaseline gauze over silver  aginate. Wrap with kerlix and coban Poly mem with boarder foam dressing to gluteal wound New wound care orders this week; continue Home Health for wound care. May utilize formulary equivalent dressing for wound Price orders unless otherwise specified. - Pt. will come to Wound Clinic every 3weeks-Please perform wound dressing changes 2 x for 2 week (i.e. the week pt. is NOT coming to the Wound Clinic) and the alternate week-Pt. will have an appointment at [facility] W[facility] Clinic and [facility] change wound dressings x1 that week. Wound dressings- Silver  alginate,cover with gauze or ABD and Bilateral Kerlix and Coban to Lower Legs. Other Home Health Orders/Instructions: - [facility] Wound Price Karla, Price ([id number]) [id number] Page 7 of 15 Wound #23 - Metatarsal head first Wound Laterality: Right Cleanser: Soap and Water  3 x  Per Week/30 Days Discharge Instructions: May shower and wash wound with dial  antibacterial soap and water  prior to dressing change. Cleanser: Wound Cleanser 3 x Per Week/30 Days Discharge Instructions: Cleanse the wound with wound cleanser prior to applying a clean dressing using gauze sponges, not tissue or cotton balls. Peri-Wound Care: Sween Lotion (Moisturizing lotion) 3 x Per Week/30 Days Discharge Instructions: Apply moisturizing lotion as directed Prim Dressing: Maxorb Extra Ag+ Alginate Dressing, 2x2 (in/in) 3 x Per Week/30 Days ary Discharge Instructions: Apply to wound bed as instructed Secondary Dressing: ABD Pad, 8x10 3 x Per Week/30 Days Discharge Instructions: Apply over primary dressing as directed. Secured With: Coban Self-Adherent Wrap 4x5 (in/yd) 3 x Per Week/30 Days Discharge Instructions: Secure with Coban as directed. Secured With: [employer], 4.5x3.1 (in/yd) 3 x Per Week/30 Days Discharge Instructions: Secure with Kerlix as directed. Secured With: 51M Medipore H Soft Cloth Surgical T ape, 4 x 10 (in/yd) 3 x Per Week/30 Days Discharge Instructions: Secure with tape as directed. Wound #37 - Hand - Dorsum Wound Laterality: Left Cleanser: Soap and Water  Discharge Instructions: May shower and wash wound with dial  antibacterial soap and water  prior to dressing change. Topical: Triple Antibiotic Ointment, 1 (oz) Tube Secondary Dressing: Bordered Gauze, 4x4 in Discharge Instructions: Apply over primary dressing as directed. Wound #38 - Gluteus Wound Laterality: Right Cleanser: Soap and Water  Discharge Instructions: May shower and wash wound with dial  antibacterial soap and water  prior to dressing change. Topical: Triple Antibiotic Ointment, 1 (oz) Tube Secondary Dressing: Zetuvit Plus Silicone Border Dressing 5x5 (in/in) Discharge Instructions: Apply silicone border over primary dressing as directed. Patient Medications llergies: gabapentin ,  Lyrica  A Notifications Medication Indication Start End 11/20/2023 lidocaine  DOSE topical 4 % cream - cream topical once daily Electronic Signature(s) Signed: 11/22/2023 5:26:35 PM By: Rosan Raisin DO Entered By: Rosan Raisin on 11/20/2023 16:49:58 -------------------------------------------------------------------------------- Problem List Details Patient Name: Date of Service: Karla KNIGHTS, DO Karla S. 11/20/2023 1:15 PM Medical Record Number: [id number] Patient Account Number: [id number] Date of Birth/Sex: Treating RN: Apr 07, 1961 (63 y.o. F) Primary Care Provider: Delbert Clam Other Clinician: Referring Provider: Treating Provider/Extender: Rosan Raisin Delbert Clam Weeks in Price: [address], [place] S ([id number]) [id number] Page 8 of 15 Active Problems ICD-10 Encounter Code Description Active Date MDM Diagnosis 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 2 diabetes mellitus with foot ulcer 04/19/2023 No Yes L97.512 Non-pressure chronic ulcer of other part of right foot with fat layer exposed 04/19/2023 No Yes L97.522 Non-pressure chronic  ulcer of other part of left foot with fat layer exposed 04/19/2023 No Yes E11.622 Type 2 diabetes mellitus with other skin ulcer 04/19/2023 No Yes L97.812 Non-pressure chronic ulcer of other part of right lower leg with fat layer 04/19/2023 No Yes exposed I73.89 Other specified peripheral vascular diseases 04/19/2023 No Yes I87.331 Chronic venous hypertension (idiopathic) with ulcer and inflammation of right 04/19/2023 No Yes lower extremity L97.828 Non-pressure chronic ulcer of other part of left lower leg with other specified 10/04/2023 No Yes severity L89.312 Pressure ulcer of right buttock, stage 2 11/20/2023 No Yes S60.512A Abrasion of left hand, initial encounter 11/20/2023 No Yes Inactive Problems Resolved Problems ICD-10 Code Description Active Date Resolved Date L89.322 Pressure ulcer of left buttock, stage 2 10/26/2023 10/26/2023 Electronic  Signature(s) Signed: 11/22/2023 5:26:35 PM By: Rosan Raisin DO Entered By: Rosan Raisin on 11/20/2023 16:36:17 -------------------------------------------------------------------------------- Progress Note Details Patient Name: Date of Service: Karla KNIGHTS, DO Karla S. 11/20/2023 1:15 PM Medical Record Number: [id number] Patient Account Number: [id number] Karla, Price ([id number]) [id number] Page 9 of 15 Date of Birth/Sex: Treating RN: February 11, 1961 (63 y.o. F) Primary Care Provider: Other Clinician: Delbert Clam Referring Provider: Treating Provider/Extender: Rosan Raisin Delbert Clam Weeks in Price: 30 Subjective Chief Complaint Information obtained from Patient Bilateral LE Ulcers History of Present Illness (HPI) This 63 year old patient who has a very long significant history of diabetes mellitus, previous alcohol and nicotine  abuse, chronic data disease, COPD, diabetes mellitus, height hypertension, critical lower limb ischemia with several wounds being managed at the wound Center at [facility] for over a year. She was recently in the ER at [facility] and was referred to our center. He has had a long history of critical limb ischemia and over a period of time has had balloon angioplasties in March 2016, endarterectomy of the left carotid, lower extremity angiogram and Price by Dr. Dorn Lesches, several cardiac catheterizations. Most recently she had an x-ray of her left foot while in the ER which showed no acute abnormality. during this ER visit she was started on ciprofloxacin  and asked to continue with the wound care physicians at [facility]. Her last ABI done in June 2017 showed the right side to be 0.28 in the left side to be 0.48. Her right toe brachial index was 0.17 on the right and 0.27 on the left. her last hemoglobin A1c was 11.1% She continues to smoke about a pack of cigarettes a day. 03/28/2017 -- -- right foot  x-ray -- IMPRESSION:Areas of soft tissue swelling. Mild subluxation second PIP joint. No frank dislocation or fracture. No erosive change or bony destruction. No soft tissue ulceration or radiopaque foreign body evident by radiography. There is plantar fascia calcification with a nearby inferior calcaneal spur. There are foci of arterial vascular calcification. 04/04/2017 -- the patient continues to be noncompliant and continues to complain of a lot of pain and has not done anything about quitting smoking Readmission: 06/26/18 on evaluation today patient presents for reevaluation she has not been seen in our office since May 2018. Since she was last seen here in our office she has undergone testing at Dr. Dorn [place] office where it was revealed that she had an ABI of 0.68 with her previous ABI being 0.64 and a left ABI of 0.38 with previous ABI being 0.34 this study which was performed on 04/05/18 was pretty much equivalent to the study performed on 11/22/17. The findings in the end revealed on the right would appear to be moderate right  lower extremity arterial disease on the left Dr. Court states moderate in the report but unfortunately this appears to be much more severe at 0.38 compared to the right. Subsequently the patient was scheduled to have angiography with Dr. Court on 04/12/18. This was however canceled due to the fact that the patient was found to have chronic kidney disease stage III and it was to the point that he did not feel that it will be safe to pursue angiography at that point. She has not been on any antibiotics recently. At this point Dr. Court has not rescheduled anything as far as the injured [place] according to the patient he is somewhat reluctant to do so. Nonetheless with her diminished blood flow this is gonna make it somewhat difficult for her to heal. 07/05/18; this is a patient I have not seen previously. She has very significant PAD as noted above. She apparently has had  revascularization efforts by Dr. Court on the right on 3 occasions to the patient. She was supposed to have an attempted angiography on the left however this was canceled apparently because of stage III chronic renal failure. I will need to research all of this. She complains of significant pain in the wound and has claudication enough that when she walks to the end of the driveway she has to stop. She is been using silver  alginate to the wounds on her legs and Iodoflex to the area on her left second toe. 07/13/18 on evaluation today patient's wounds actually appear to be doing about the same. She has an appointment she tells me within the next month that is September 2019 with Dr. Court to discuss options to see if there's anything else that you can recommend or do for her. Nonetheless obviously what we're trying to do is do what we can to save her leg and in turn prevent any additional worsening or damage. None in the meantime we been mainly trying to manage her ulcers as best we can. 07/27/18; some improvement in the multiplicity of wounds on her left lower extremity and foot. She's been using silver  alginate. I was unable to determine that she actually has an appointment with Dr. Court. We are checking into this The patient's wound includes Left lateral foot, left plantar heel, her left anterior calf wound looks close to me, left fourth toe is very close to closed and the left medial calf is perhaps the largest open area READMISSION 02/19/2021 This is a now 63 year old woman with type 2 diabetes and continued cigarette smoking. She has known PAD. She was last in this clinic in 2019 at that point with wounds on her left foot. She left in a nonhealed state. On 06/28/2019 I see she had a left femoropopliteal by vein and vascular with an amputation of the fifth ray. These managed to heal over. Her last arterial studies were in February 2021. This showed an absent waveform at the posterior tibial artery  on the right a dampened monophasic dorsalis pedis on the right of 0.44. On the left again the PTA TA was absent her dorsalis pedis was 0.99 triphasic and her great toe pressure was 0.65. This would have been after her revascularization. Once again she tells me that Dr. Court has done revascularization on the right leg on 3 different occasions. She has a right transmetatarsal amputation apparently done by Dr. Harden remotely but I do not see a note for these right lower extremity revascularization but I may not of looked back far enough.  In any case she says that the she has a wound on the plantar aspect of the right transmetatarsal amputation site there is been there for several months. More recently she fell and had a wound on her right medial lower leg she had 5 sutures placed in the ER and then subsequently she has developed an area on the medial lower leg which was a blister that opened up. Past medical history includes type 2 diabetes, PAD, chronic renal failure, congestive heart failure, right transmetatarsal amputation, left fifth ray amputation, continued tobacco abuse, atrial fibrillation and cirrhosis. We did not attempt an ABI on the right leg today because of pain 02/26/2021; patient we admitted to the clinic last week. She has what looks to be 2 areas on her right medial and right anterior lower leg that look more like venous wounds but she has 1 on the first met head at the base of her right transmet. She has known severe PAD. She complains of a lot of pain although some of this may be neuropathic. Is difficult to exclude a component of claudication. Use silver  alginate on the wounds on her legs and Iodoflex on the area on the first met head. She has an appointment with Dr. Court on 5/25 although I will text him and discussed the situation. She is have poorly controlled diabetic. She has has stage IIIb chronic renal failure 4/22; patient presents for 1 week follow-up. The 2 areas on her  right medial and right anterior lower leg appear well-healing. She has been using silver  alginate to this area without issues. She has a first met head ulcer and it is unclear how long this has been there as it was discovered in the ED earlier this month when she was being evaluated for another issue. Iodoflex has been used at this area. 4/29; patient presents for 1 week follow-up. She has been using silver  alginate to the leg wounds and Iodoflex to the plantar foot wound. She has had this LIRIO, BACH ([id number]) [id number] Page 10 of 15 wrapped with Coban and Kerlix. She has no concerns or complaints today. 5/6; this is a difficult wound on the right plantar foot transmit site. We are not making a lot of progress. She had 2 more venous looking wounds on the right medial and right anterior lower leg 1 of which is healed. Her appointment with Dr. Court is on 5/25 5/16; she did not tolerate the offloading shoe we gave her in fact she had a fall with an abrasion on her right forearm she is now back in the modified small shoe which does not offload her foot properly. Her appoint with Dr. Court is still on 5/25 we have been using Iodoflex. The area on the right anterior lower leg is healed 7/18; patient presents for follow-up however has not been seen in 2 months. She was last seen at the end of May. She had a fall at the end of June and was hospitalized. She has been unable to follow-up since then. For the wound she has only been keeping it covered with gauze. She is scheduled for an aortogram with lower extremity runoff at the end of July. 7/29; patient presents for 2-week follow-up. She has home health and they have been changing her dressings. She denies any issues and has no complaints today. She states she had a procedure where they opened up one of her vessels in her legs. She denies signs of infection. 8/5; patient presents for follow-up. She was  recently in the  hospital for bradycardia. She was noted to have cellulitis to the left lower extremity and Unna boot was placed due to blisters and increased swelling. She reports improvement to her left leg since being in the hospital and stability check her right plantar foot wound. She denies signs of infection. 8/23; since I last saw this patient a lot has happened. She still has the area over the right first met head in the setting of previous transmetatarsal amputation. Firstly most importantly she underwent revascularization of her occluded right SFA. She underwent directional atherectomy followed by drug-coated balloon angioplasty. She has no named vessel below the knee hopefully the revascularization of the right SFA will improve collateral flow. It is not felt however that she has any endovascular options. She had follow-up arterial studies noninvasive on 8/9. These showed an ABI of 0.44 at the right PTA. Monophasic waveforms. On the left her great toe ABI was 0.68. Her follow-up arterial Doppler showed a 50 to 74% stenosis in the proximal SFA and a 50 to 74% stenosis in the distal SFA mild to severe atherosclerosis noted throughout the extremity. Areas of shadowing plaque seen, unable to rule out higher grade stenosis she also had a fall apparently wearing a right foot forefoot off loader that we gave her. She therefore comes in in an ordinary running shoe today. Not certain if this is the fall that ended up in the hospital with bradycardia 9/6 area over the right first metatarsal head in the setting of her previous amputation and severe PAD. She is also a diabetic with known PAD status post attempt at revascularization. She is continued smoker Again the separation of visits in the clinic is somewhat disturbing if we are going to consider her for a total contact cast. She is not wearing anything to offload this stating it causes imbalance especially the forefoot off loader. She basically comes in in  bedroom slippers She also had a fall this morning about an hour ago. She has a skin tear on the left dorsal forearm 9/13; areas over the right first metatarsal head in the setting of previous TMA and severe PAD. Again she comes in here in slippers. We have been using Hydrofera Blue. We use MolecuLight on this which was essentially negative study 9/20; right first metatarsal head in the setting of her previous TMA and severe PAD. Same slipper type shoes. She says she cannot wear a forefoot off loader and for some reason she will not wear surgical shoes. She says she is smoking half a pack per day. 9/30; right first metatarsal head. Absolutely no improvement in fact there is tunneling superiorly very close to bone. She does not offload this properly at all. We use MolecuLight on this 2 weeks ago that did not show any surface bacteria we have been using silver  collagen is a dressing She tells me she is down to 3 cigarettes a day I have offered encouragement and a Price plan 10/6; right first metatarsal head. Exposed bone this week which is not surprising. We have been using silver  collagen. She is smoking 5 to 10 cigarettes a day 10/17; she has not been here in 10 days. The bone scraping that I did on 10/6 showed staph lugdunensis, staph epidermidis and Pseudomonas Alcalifenes. I put in for Septra  DS 1 p.o. twice daily for 14 days last week would be but we could not reach her to start the antibiotic. Quinolones would have been a good alternative except she has multiple  drug interactions. Septra  would not cover what ever the Pseudomonas is but I am not really sure of the relevance here. I am going to try her on the Septra  for 2 weeks. She has a probing wound on the right TMA that probes to bone. She claims to have just about stop smoking I reviewed her arterial status. She underwent a left fem-tib bypass by Dr. Oris in 06/28/2019. Her last angiogram was in July of this year by Dr. Court. This showed  an occluded right SFA the popliteal and tibial vessels were also occluded the peroneal vessel filled by collaterals and filled the PT and DP by communicating collaterals. She was not felt to have any additional and vascular options. Dr. Court noted that she he had previously revascularized her right SFA 3 times. 10/25; she is tolerating the Septra  but says it makes her nauseated. I am going to continue this for an additional 2 weeks perhaps for a total of 6 weeks. Her wound does not look too much different. There is on the right first met metatarsal head. There is probing areas around the tissue that is in the middle of the wound. There is no purulence I cannot feel bone. The original source was for a bone scraping. 11/8; right first metatarsal head. This does not probe to bone and there is no purulence. As far as I can tell she is completed the Septra  although there were problems with exact length of time and I think compliance. In any case I gave her 6 weeks. I have reviewed her PAD above. 11/15; right first metatarsal head. Again protruding subcutaneous tissue but this does not have any adherence to surrounding skin. Undermining circumferentially. There is no palpable bone. Not grossly purulent. We have been using Iodoflex to try to get some adherence to clean off the surface but no real improvement. I do not think they actually had enough supplies listening to the patient. She completed the 6 weeks of Septra  I gave her, but I am not sure about the compliance with the dates i.e. may have missed days taken 1 a day etc. The patient has a vascular follow-up with Dr. Court this coming Friday i.e. in 3 days. By my read of arterial evaluations I do not think she is felt to be a candidate for any further revascularization. The last angiogram was in July. 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 She continues to smoke now up to 10 cigarettes a day. She is not in any pain. Wound has not  improved but is certainly not worsened. I gave her 6 weeks of trimethoprim /sulfamethoxazole  which she is completed in some fashion. 11/29 right first metatarsal head previous TMA. Wound actually looks somewhat better today still a lot of callus around the wound on debris on the wound surface. UNFORTUNATELY the patient missed her appointment with Dr. Court and is not rebooked apparently until sometime in February 12/13; right first metatarsal wound actually looks some smaller today. I did not debride this today. She is using Iodoflex.  When she came in last week she had a large abscess on her left anterior lower leg apparently after falling we send her to the ER to have this drained which was done. I cannot see any cultures x-ray was negative. She was apparently given 2 doses of IV antibiotics and discharged on Bactrim  DS twice daily which she is still taking. As mentioned I cannot see any cultures. 12/29; culture of the abscess on the left leg  I did last time showed Serratia. We gave her antibiotics. I note she was in the ER next day with bleeding which they controlled she is on anticoagulants.She comes in today to clinic and the area on her right plantar foot is miraculously very hyperkeratotic but healed 02/06/2023 Ms. 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 She reports a callus to the right first met head and on the lateral aspect of the left foot. She saw podiatry 3 days ago and had the callus debrided. There are no open wounds at this time. She follows up with podiatry in 2 weeks. Readmission: 04-19-2023 upon evaluation today patient appears to be doing well all things considered in regard to her wounds. She does have several wounds however on her feet 1 on the left foot centrally along the lateral edge and then a wound on the right lower leg more posterior. On the right foot  plantar distal at the transmetatarsal amputation site this is the worst. With that being said she tells me this has been going on for quite some time she has been seen by podiatry they have done some debridements in the past. With that being said based on what we are seeing I believe that the patient's primary issue on the right side is that she is having some issues here with her arterial flow. The ABI on the right is 0.57 on the left is 1.04. With that being said this reading on the right is getting Karla, Price ([id number]) [id number] Page 11 of 15 necessitated further evaluation by vascular. She tells me she has been seen by Dr. Wadie in the past and he stated there was nothing further to be done for the right leg. With that being said I think that it would be good to have a second opinion with vascular on this. Patient does have a history of diabetes mellitus type 2, peripheral vascular disease, and hypertension. The previous ABIs were performed on January 11, 2023. 04-26-2023 upon evaluation today patient appears to be doing about the same in regard to her wounds. She still has not had the arterial study at this point. I think this is something needs to be done as quickly as possible. Fortunately I do not see any evidence of active infection locally or systemically which is great news. No fevers, chills, nausea, vomiting, or diarrhea. 7/24; the patient has not been here in about a month and a half. She has open wounds on the plantar right foot second metatarsal head in the setting of a previous TMA, she has a new area on the left lateral foot and perhaps traumatic areas on the right anterior and left lateral lower legs. She has poorly controlled edema. The patient has known severe PAD has had multiple interventions in the right SFA. Known occlusive disease on the right which is severe her last arterial studies were in February 2024 on the right her PTA could not be  measured absent. She had an pressure of 60 at the dorsalis pedis giving her an ABI of 0.57 monophasic waveforms here. On the left her study was a lot better with an ABI 1.04 triphasic waveforms. The ABI might be falsely elevated on the left. I think at her last angiogram she had occluded right SFA popliteal and tibial vessels with the peroneal vessel being the only vessel feeding her foot via collaterals. She  comes in this time in basic over-the-counter shoes 06-14-2023 upon evaluation today patient appears to be doing well currently in regard to her wounds all things considered except for the left lateral foot where she has what appears to be a blister this is erythematous as well and concerned about infection. I discussed with her today that I do think we need to see about doing the Vi core culture and subsequently depending on the results of this we will initiate antibiotic Price as necessary. With that being said right now I think that she is going to need to be aggressive and elevating her legs she has an appointment with vascular coming up around 3 August is what I believe her appointment date is. Nonetheless once we get clearance from the standpoint of vascular she does have good blood flow and we can subsequently try to see what we do about getting her edema under much better control and then she really needs some much stronger compression than what we currently are able to do. 06-21-2023 upon evaluation today patient appears to be doing pretty well currently in regard to her wounds most of which seem to be showing signs of improvement. She has been require little bit of debridement in regard to the right plantar foot wound I will do this as carefully as possible as we are still working on establishing that she has sufficient blood flow at this point. This right side on previous testing seem to be somewhat questionable. Fortunately I do not see any signs of active infection locally nor  systemically at this time. 06-28-2023 patient does have an appointment with vascular on 07-11-2023. With that being said right now she has been having some slow progress but still nonetheless good progress at this point. I am actually very pleased with where we stand. I do not see any signs of active infection locally or systemically which is great news. No fevers, chills, nausea, vomiting, or diarrhea. 07-05-2023 upon evaluation today patient appears to be doing well currently in regard to her wound. She has been tolerating the dressing changes. She only has 1 wound left on the plantar aspect of the right foot. With that being said she does have a peg assist offloading shoe she has been using at this time. 8/28; patient with a wound over the plantar right foot metatarsal head. She was seen by Dr. Magda of vascular surgery. Noted that the right ABI was only 0.57 it was felt that she had progression versus a previous study. She had some stenosis in the SFA a 7599% stenosis in the proximal mid SFA mild stenosis in the distal SFA and mild stenosis in the proximal PTA. On the left she had a patent left femoral to ATA bypass graft with mild stenosis It was recommended that she undergo angiography for limb salvage on the right. That is being arranged as we speak. Her wound is on the right first metatarsal head she has been using collagen as the primary dressing. She tells me she is making every effort to stop smoking 07-19-2023 upon evaluation today patient appears to be doing okay in regard to her foot ulcer although her arterial procedure/revascularization keeps getting pushed back. She states that they are talking about pushing back and left nail due to the fact that she changed insurances and I do not have a being updated insurance card number. With that being said I explained to the patient that should be something that she should be able to get on line  easily by making an account. She is also called  [employer] and they said they were sending it but it would be about a week before described by next Wednesday. She really does not have time to be holding on this she needs to be proceeding with intervention as quickly as possible. 07-26-2023 upon evaluation today patient appears to be doing well currently in regard to her wound. This is actually showing signs of looking about the same I did clear a lot of callus up last week this actually looks better to me from an overall visual standpoint than last week though it is a little larger because of the callus I had removed. With that being said I feel like she is actually making really good progress here towards complete closure. No fevers, chills, nausea, vomiting, or diarrhea. 08-09-2023 upon evaluation today patient presents for follow-up concerning her ongoing issues with her right plantar foot ulcer. She is going to be having arteriogram on Friday that is just just 2 days and hopefully following this it will actually allow for more aggressive healing in regard to the right plantar foot. Today I am going to perform some debridement to clear away some of the necrotic debris. 08-23-2023 upon evaluation patient's wound really appears to be doing about the same. She did have her arteriogram with Dr. Magda and that was on 9-27- 2024. Unfortunately it was noted that the patient had no good options for further revascularization. They plan to reevaluate there in a month but she is stated to be at high risk for major amputation. With that being said it does appear they were able to do stenting of the right femoral-popliteal artery but unfortunately they were unable to cross the TP trunk and proximal peroneal occlusion. Subsequently this means that there is really not good blood flow into the ankle and foot which means that the patient really does not have a good chance of getting this to heal. 08-30-2023 upon evaluation today patient appears to be doing  well currently in regard to her wound. She has been tolerating the dressing changes without complication and in general I do feel that the patient is doing quite well at this time. Unfortunately with the blood flow being so low and really no real vascular options to improve blood flow she really has to try to stay off of this is much as possible. 09-06-2023 upon evaluation today patient appears to be doing well currently in regard to the wound on her plantar foot it does not seem to be any worse is not terribly better but again there is really not much we can do from a blood flow standpoint at this time unfortunately. There does not appear to be any signs of active infection locally or systemically which is good news. She does have a wound on the posterior ankle location and the right leg as well. She also has an area on the left lateral foot. 09-13-2023 upon evaluation today patient appears to be doing decently well all things considered in regard to her wounds. Fortunately I do not see any signs of worsening overall I do believe that the patient is making good headway towards closure which is good news. No fevers, chills, nausea, vomiting, or diarrhea. 11/20; since the patient was last here she was admitted to hospital from 11/9 through 09/27/2023. She had atrial fibrillation with rapid ventricular response she underwent successful cardioversion to normal sinus rhythm. She has noted to have stage III chronic renal failure, heart failure  with preserved ejection fraction. She was discharged home apparently with a furosemide  infusion pump although I do not see that on her discharge summary. She is listed as having torsemide  to 100 mg/day. Unfortunately in any case she is gaining weight quite substantially per description of the patient. She has significant bilateral lower extremity edema but nonpitting. She has no open wounds on the left leg and right leg. She was not discharged with any home health and  I am not really clear anybody is dressing these wounds. She has known PAD She has the original wounds on the right first met head and right posterior Achilles area. New wounds on the right anteriorly and right posteriorly and on the left laterally and posteriorly. All her wounds are draining clear edema fluid Karla, Price ([id number]) [id number] Page 12 of 15 12/5; since the patient was last here she was admitted to hospital from 11/26 through 11/29. She had acute on chronic diastolic congestive heart failure. She was also in the ER on 12/3 with a small abscess in her right buttock. She was put on doxycycline . She had her legs wrapped when she left the hospital but she took these off 2-3 deep days ago. There is very significant bilateral lower extremity edema which she is tense. She has wounds on her right first metatarsal head right anterior lower extremity right posterior lower extremity x 2 and her left lateral and left posterior lower extremity. The areas on the lower extremity looked mostly like venous wounds although her complicating systemic fluid volume overload is no doubt contributing. We have been using silver  alginate and Kerlix Coban. As mentioned she took these off a few days ago The patient saw Dr. Magda on 10/10/2023. She is status post right femoral-popliteal angioplasty and stenting 12/12; patient with wounds on the right greater than left lower leg. She arrives in clinic today with the edema control much better even though we just use kerlix Coban. I do not know what dose of diuretic she is on. She has wounds on the right posterior leg right lateral leg and more superficial areas on the left lateral leg. She also has areas on the right first metatarsal head. The patient has significant PAD status post right femoropopliteal angioplasty and stenting. Also chronic venous insufficiency 11/20/2023; patient presents to the clinic for follow-up. She has a  wound to the right first met head. She just recently developed a skin tear to the dorsal hand. She has also developed skin breakdown to the right buttocks over the past week. She has been using Neosporin to the new wound beds. The right Lower extremity has been placed in compression and silver  alginate used to the foot wound. she currently denies signs of infection. She has no issues or complaints today. Objective Constitutional respirations regular, non-labored and within target range for patient.. Vitals Time Taken: 2:02 PM, Height: 64 in, Weight: 225 lbs, BMI: 38.6, Temperature: 98 F, Pulse: 74 bpm, Respiratory Rate: 18 breaths/min, Blood Pressure: 125/77 mmHg, Capillary Blood Glucose: 149 mg/dl. Cardiovascular 2+ dorsalis pedis/posterior tibialis pulses. Psychiatric pleasant and cooperative. General Notes: T the plantar aspect of the right foot there is an open wound with granulation tissue and nonviable tissue. Minimal scattered areas with o superficial breakdown to the right leg. No open wounds to the left lower extremity. Skin tear to the left dorsal hand with granulation tissue. Skin breakdown to the right gluteus. Epithelialized skin to the left gluteus previous wound sites. No signs of infection including increased warmth,  erythema or purulent drainage from any of the wound beds. Integumentary (Hair, Skin) Wound #23 status is Open. Original cause of wound was Gradually Appeared. The date acquired was: 04/18/2021. The wound has been in Price 30 weeks. The wound is located on the Right Metatarsal head first. The wound measures 0.2cm length x 0.3cm width x 0.2cm depth; 0.047cm^2 area and 0.009cm^3 volume. There is Fat Layer (Subcutaneous Tissue) exposed. There is no tunneling or undermining noted. There is a medium amount of serosanguineous drainage noted. The wound margin is distinct with the outline attached to the wound base. There is no granulation within the wound bed. There is a  large (67- 100%) amount of necrotic tissue within the wound bed including Eschar and Adherent Slough. The periwound skin appearance had no abnormalities noted for color. The periwound skin appearance exhibited: Callus. The periwound skin appearance did not exhibit: Dry/Scaly, Maceration. Periwound temperature was noted as No Abnormality. Wound #29 status is Healed - Epithelialized. Original cause of wound was Bump. The date acquired was: 08/20/2023. The wound has been in [facility] 12 weeks. The wound is located on the Right,Anterior Lower Leg. The wound measures 0cm length x 0cm width x 0cm depth; 0cm^2 area and 0cm^3 volume. There is no tunneling or undermining noted. There is a none present amount of drainage noted. The wound margin is distinct with the outline attached to the wound base. There is no granulation within the wound bed. There is a small (1-33%) amount of necrotic tissue within the wound bed including Adherent Slough. The periwound skin appearance did not exhibit: Callus, Crepitus, Excoriation, Induration, Rash, Scarring, Dry/Scaly, Maceration, Atrophie Blanche, Cyanosis, Ecchymosis, Hemosiderin Staining, Mottled, Pallor, Rubor, Erythema. Periwound temperature was noted as No Abnormality. Wound #30 status is Healed - Epithelialized. Original cause of wound was Gradually Appeared. The date acquired was: 09/06/2023. The wound has been in [facility] 10 weeks. The wound is located on the Right,Distal,Posterior Lower Leg. The wound measures 0cm length x 0cm width x 0cm depth; 0cm^2 area and 0cm^3 volume. There is no tunneling or undermining noted. There is a none present amount of drainage noted. The wound margin is distinct with the outline attached to the wound base. There is no granulation within the wound bed. There is no necrotic tissue within the wound bed. The periwound skin appearance did not exhibit: Callus, Crepitus, Excoriation, Induration, Rash, Scarring, Dry/Scaly, Maceration,  Atrophie Blanche, Cyanosis, Ecchymosis, Hemosiderin Staining, Mottled, Pallor, Rubor, Erythema. Periwound temperature was noted as No Abnormality. Wound #32 status is Healed - Epithelialized. Original cause of wound was Blister. The date acquired was: 10/04/2023. The wound has been in [facility] 6 weeks. The wound is located on the Left,Lateral Lower Leg. The wound measures 0cm length x 0cm width x 0cm depth; 0cm^2 area and 0cm^3 volume. There is no tunneling or undermining noted. There is a none present amount of drainage noted. There is no granulation within the wound bed. There is no necrotic tissue within the wound bed. The periwound skin appearance had no abnormalities noted for texture. The periwound skin appearance had no abnormalities noted for moisture. The periwound skin appearance did not exhibit: Atrophie Blanche, Cyanosis, Ecchymosis, Hemosiderin Staining, Mottled, Pallor, Rubor, Erythema. Periwound temperature was noted as No Abnormality. Wound #34 status is Healed - Epithelialized. Original cause of wound was Gradually Appeared. The date acquired was: 10/19/2023. The wound has been in [facility] 4 weeks. The wound is located on the Left,Proximal,Lateral Lower Leg. The wound measures 0cm length x 0cm width x  0cm depth; 0cm^2 area and 0cm^3 volume. There is no tunneling or undermining noted. There is a none present amount of drainage noted. The wound margin is flat and intact. There is no granulation within the wound bed. There is no necrotic tissue within the wound bed. The periwound skin appearance had no abnormalities noted for texture. The periwound skin appearance had no abnormalities noted for moisture. The periwound skin appearance had no abnormalities noted for color. Periwound temperature was noted as No Abnormality. The periwound has tenderness on palpation. Wound #35 status is Healed - Epithelialized. Original cause of wound was Gradually Appeared. The date acquired was:  10/26/2023. The wound has been in [facility] 3 weeks. The wound is located on the Left,Distal,Lateral Lower Leg. The wound measures 0cm length x 0cm width x 0cm depth; 0cm^2 area and 0cm^3 volume. There is no tunneling or undermining noted. There is a none present amount of drainage noted. There is no granulation within the wound bed. There is no necrotic tissue within the wound bed. The periwound skin appearance did not exhibit: Callus, Crepitus, Excoriation, Induration, Rash, Scarring, Karla, Price ([id number]) [id number] Page 13 of 15 Dry/Scaly, Maceration, Atrophie Blanche, Cyanosis, Ecchymosis, Hemosiderin Staining, Mottled, Pallor, Rubor, Erythema. Wound #36 status is Healed - Epithelialized. Original cause of wound was Pressure Injury. The date acquired was: 10/26/2023. The wound has been in Price 3 weeks. The wound is located on the Left Gluteal fold. The wound measures 0cm length x 0cm width x 0cm depth; 0cm^2 area and 0cm^3 volume. There is no tunneling or undermining noted. There is a none present amount of drainage noted. There is no granulation within the wound bed. There is no necrotic tissue within the wound bed. Wound #37 status is Open. Original cause of wound was Skin T ear/Laceration. The date acquired was: 11/19/2023. The wound is located on the Left Hand - Dorsum. The wound measures 1.2cm length x 2.3cm width x 0.1cm depth; 2.168cm^2 area and 0.217cm^3 volume. There is Fat Layer (Subcutaneous Tissue) exposed. There is no tunneling or undermining noted. There is a medium amount of serosanguineous drainage noted. There is medium (34-66%) red granulation within the wound bed. There is a medium (34-66%) amount of necrotic tissue within the wound bed including Adherent Slough. Wound #38 status is Open. Original cause of wound was Gradually Appeared. The date acquired was: 09/15/2023. The wound is located on the Right Gluteus. The wound measures 1.5cm  length x 3.2cm width x 0.1cm depth; 3.77cm^2 area and 0.377cm^3 volume. There is Fat Layer (Subcutaneous Tissue) exposed. There is no tunneling or undermining noted. There is a medium amount of serosanguineous drainage noted. There is large (67-100%) granulation within the wound bed. The periwound skin appearance did not exhibit: Callus, Crepitus, Excoriation, Induration, Rash, Scarring, Dry/Scaly, Maceration, Atrophie Blanche, Cyanosis, Ecchymosis, Hemosiderin Staining, Mottled, Pallor, Rubor, Erythema. 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 I debrided nonviable tissue to the  plantar foot wound. I recommended continuing with silver  alginate to this area and wrapped that she has minimal areas of skin breakdown distal to the right lower extremity. The left lower extremity wounds have healed. I recommended compression stockings daily. We gave her Tubigrip in office. She now has 2 new wounds 1 to the left hand caused by trauma and wounds to the right glutes secondary to pressure. I recommended antibiotic ointment here. Also recommended aggressive offloading to the right gluteal. Follow-up in 1 week. Procedures Wound #23 Pre-procedure diagnosis of Wound #23 is a Diabetic Wound/Ulcer of the Lower Extremity located on the Right Metatarsal head  first .Severity of Tissue Pre Debridement is: Fat layer exposed. There was a Excisional Skin/Subcutaneous Tissue Debridement with a total area of 0.05 sq cm performed by Rosan Raisin, DO. With the following instrument(s): Curette to remove Non-Viable tissue/material. Material removed includes Subcutaneous Tissue and Slough and after achieving pain control using Lidocaine  4% Topical Solution. No specimens were taken. A time out was conducted at 14:45, prior to the start of the procedure. A Minimum amount of bleeding was controlled with Pressure. The procedure was tolerated well. Post Debridement Measurements: 0.2cm length x 0.3cm width x 0.2cm depth; 0.009cm^3 volume. Character of Wound/Ulcer Post Debridement is improved. Severity of Tissue Post Debridement is: Fat layer exposed. Post procedure Diagnosis Wound #23: Same as Pre-Procedure Plan Follow-up Appointments: Return appointment in 3 weeks. - Dr Rosan Anesthetic: (In clinic) Topical Lidocaine  5% applied to wound bed Bathing/ Shower/ Hygiene: May shower with protection but do not get wound dressing(s) wet. Protect dressing(s) with water  repellant cover (for example, large plastic bag) or a cast cover and may then take shower. Edema Control - Orders / Instructions: Elevate legs to the level of the heart or above for 30 minutes daily and/or when sitting for 3-4 times a day throughout the day. Avoid standing for long periods of time. Off-Loading: Open toe surgical shoe to: - with Peg Assist in shoe. Limit walking on foot. Stay off the foot to minimize pressure to bottom of foot. Additional Orders / Instructions: Stop/Decrease Smoking Follow Nutritious Diet Non Wound Condition: Apply the following to affected area as directed: - left foot- apply betadine to affected area cover with ABD pad, secure with kerlix tubigrip size E. Home Health: Wound #23 Right Metatarsal head first: No change in wound care orders this week; continue Home  Health for wound care. May utilize formulary equivalent dressing for wound [facility] orders unless otherwise specified. - Silver  alginate to lower leg wounds, cover with ABD, do not use xeroform or Vaseline gauze over silver  aginate. Wrap with NAJAT, OLAZABAL ([id number]) [id number] Page 14 of 15 and coban Poly mem with boarder foam dressing to gluteal wound New wound care orders this week; continue Home Health for wound care. May utilize formulary equivalent dressing for wound [facility] orders unless otherwise specified. - Pt. will come to Wound Clinic every 3weeks-Please perform wound dressing changes 2 x for 2 week (i.e. the week pt. is NOT coming to the Wound Clinic) and the alternate week-[facility]. will [facility] change wound dressings x1 that week. Wound dressings- Silver  alginate,cover with gauze or ABD and Bilateral Kerlix and Coban to Lower Legs. Other Home Health Orders/Instructions: - [facility] The following medication(s) was prescribed: lidocaine  topical 4 % cream cream topical once daily was prescribed at facility WOUND #23: - Metatarsal head first Wound Laterality: Right Cleanser: Soap and Water  3 x  Per Week/30 Days Discharge Instructions: May shower and wash wound with dial  antibacterial soap and water  prior to dressing change. Cleanser: Wound Cleanser 3 x Per Week/30 Days Discharge Instructions: Cleanse the wound with wound cleanser prior to applying a clean dressing using gauze sponges, not tissue or cotton balls. Peri-Wound Care: Sween Lotion (Moisturizing lotion) 3 x Per Week/30 Days Discharge Instructions: Apply moisturizing lotion as directed Prim Dressing: Maxorb Extra Ag+ Alginate Dressing, 2x2 (in/in) 3 x Per Week/30 Days ary Discharge Instructions: Apply to wound bed as instructed Secondary Dressing: ABD Pad, 8x10 3 x Per Week/30 Days Discharge Instructions: Apply over primary dressing as  directed. Secured With: Coban Self-Adherent Wrap 4x5 (in/yd) 3 x Per Week/30 Days Discharge Instructions: Secure with Coban as directed. Secured With: [employer], 4.5x3.1 (in/yd) 3 x Per Week/30 Days Discharge Instructions: Secure with Kerlix as directed. Secured With: 44M Medipore H Soft Cloth Surgical T ape, 4 x 10 (in/yd) 3 x Per Week/30 Days Discharge Instructions: Secure with tape as directed. WOUND #37: - Hand - Dorsum Wound Laterality: Left Cleanser: Soap and Water  Discharge Instructions: May shower and wash wound with dial  antibacterial soap and water  prior to dressing change. Topical: Triple Antibiotic Ointment, 1 (oz) Tube Secondary Dressing: Bordered Gauze, 4x4 in Discharge Instructions: Apply over primary dressing as directed. WOUND #38: - Gluteus Wound Laterality: Right Cleanser: Soap and Water  Discharge Instructions: May shower and wash wound with dial  antibacterial soap and water  prior to dressing change. Topical: Triple Antibiotic Ointment, 1 (oz) Tube Secondary Dressing: Zetuvit Plus Silicone Border Dressing 5x5 (in/in) Discharge Instructions: Apply silicone border over primary dressing as directed. 1. In office sharp debridement 2. Antibiotic ointment 3. Silver  alginate under Compression of right lower extremity 4. Follow-up in 1 week Electronic Signature(s) Signed: 11/22/2023 5:26:35 PM By: Rosan Raisin DO Entered By: Rosan Raisin on 11/20/2023 16:50:09 -------------------------------------------------------------------------------- SuperBill Details Patient Name: Date of Service: Karla KNIGHTS, DO Karla S. 11/20/2023 Medical Record Number: [id number] Patient Account Number: [id number] Date of Birth/Sex: Treating RN: [birth year] (63 y.o. F) Primary Care Provider: Delbert Clam Other Clinician: Referring Provider: Treating Provider/Extender: Rosan Raisin Delbert Clam Weeks in Price: 30 Diagnosis Coding ICD-10 Codes Code Description 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  2 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 L97.522 Non-pressure chronic ulcer of other part of left foot with fat layer exposed E11.622 Type 2 diabetes mellitus with other skin ulcer L97.812 Non-pressure chronic ulcer of other part of right lower leg with fat layer exposed I73.89 Other specified peripheral vascular diseases I87.331 Chronic venous hypertension (idiopathic) with ulcer and inflammation of right lower extremity L97.828 Non-pressure chronic ulcer of other part of left lower leg with other specified severity LOGHAN, SUBIA ([id number]) [id number] Page 15 of 15 L89.312 Pressure ulcer of right buttock, stage 2 S60.512A Abrasion of left hand, initial encounter Facility Procedures : CPT4 Code: [id number] Description: 11042 - DEB SUBQ TISSUE 20 SQ CM/< ICD-10 Diagnosis Description L97.512 Non-pressure chronic ulcer of other part of right foot with fat layer exposed 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 2 diabetes mellitus with foot ulcer Modifier: Quantity: 1 Physician Procedures : CPT4 Code Description Modifier [id number] - WC PHYS LEVEL 3 - EST PT 25 ICD-10 Diagnosis Description L89.312 Pressure ulcer of right buttock, stage 2 S60.512A Abrasion of left hand, initial encounter Quantity: 1 : [id number] 11042 - WC PHYS SUBQ TISS 20 SQ CM ICD-10 Diagnosis Description L97.512 Non-pressure chronic ulcer of other part of right foot with fat  layer exposed 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 2 diabetes mellitus with foot ulcer Quantity: 1 Electronic Signature(s) Signed: 11/22/2023 5:26:35 PM By: Rosan Raisin DO Entered By: Rosan Raisin on 11/20/2023 16:50:15

## 2023-11-27 ENCOUNTER — Encounter (HOSPITAL_BASED_OUTPATIENT_CLINIC_OR_DEPARTMENT_OTHER): Payer: 59 | Admitting: Internal Medicine

## 2023-11-27 DIAGNOSIS — S60512A Abrasion of left hand, initial encounter: Secondary | ICD-10-CM

## 2023-11-27 DIAGNOSIS — L97512 Non-pressure chronic ulcer of other part of right foot with fat layer exposed: Secondary | ICD-10-CM | POA: Diagnosis not present

## 2023-11-27 DIAGNOSIS — L89312 Pressure ulcer of right buttock, stage 2: Secondary | ICD-10-CM

## 2023-11-27 DIAGNOSIS — E11621 Type 2 diabetes mellitus with foot ulcer: Secondary | ICD-10-CM | POA: Diagnosis not present

## 2023-11-27 DIAGNOSIS — I87331 Chronic venous hypertension (idiopathic) with ulcer and inflammation of right lower extremity: Secondary | ICD-10-CM | POA: Diagnosis not present

## 2023-11-28 ENCOUNTER — Other Ambulatory Visit (HOSPITAL_COMMUNITY): Payer: Self-pay

## 2023-11-28 NOTE — Progress Notes (Signed)
 Paramedicine Encounter    Patient ID: [name], female    DOB: 1961-06-02, 63 y.o.   MRN: [id number]   Complaints-leg pain   Edema-legs are wrapped   Compliance with meds-yes  Pill box filled-yes If so, by whom-paramedic   Refills needed-symbicort     Pt reports doing ok other than her chronic leg pain.  She denies dizziness, no increased sob, no c/p. Weight stable.  HHN just left. She will wrap legs again on Thursday.  Lungs slight exp wheezing but no sob complaints.  Meds verified and  pill box refilled.  Would like to get her back on bubble packs and pt wants to be back on them also.   She got another delivery from the mail order after calling to cancel, I asked [facility] to call to move her meds from there so we can do bubble packs again soon.    CBG PTA-149  BP (!) 146/62   Pulse 68   Resp 18   Wt 209 lb (94.8 kg)   LMP  (LMP Unknown)   SpO2 99%   BMI 38.23 kg/m  Weight yesterday--209  Last visit weight-210  Patient Care Team: Court Dorn PARAS, MD as PCP - Cardiology (Cardiology) Cindie Ole DASEN, MD as PCP - Electrophysiology (Cardiology) Court Dorn PARAS, MD as Consulting Physician (Cardiology) Darlean Ozell NOVAK, MD as Consulting Physician (Pulmonary Disease) System, Provider Not In  Patient Active Problem List   Diagnosis Date Noted   Heart failure ([name]) 10/11/2023   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) and diastolic (congestive) heart failure ([name]) 10/10/2023   Pre-ulcerative calluses 04/25/2022   COPD (chronic obstructive pulmonary disease) ([name]) 05/05/2021   OSA (obstructive sleep apnea) 05/05/2021   Stage 3b chronic kidney disease ([name]) 05/05/2021   Pressure injury of skin 05/04/2021   Diabetic nephropathy ([name]) 01/02/2021   Low back pain 06/01/2020   Hyperlipidemia 04/03/2020   PAD (peripheral artery disease) ([name]) 12/26/2019   NICM (nonischemic cardiomyopathy) ([name]) 06/20/2019   Non-healing ulcer ([name]) 06/20/2019    Coagulation disorder ([name]) 08/09/2017   Depression 07/21/2017   At risk for adverse drug reaction 06/20/2017   Peripheral neuropathy 06/20/2017   S/P transmetatarsal amputation of foot, right ([name]) 06/05/2017   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) 05/19/2017   Obesity, class 2 02/24/2016   Encounter for therapeutic drug monitoring 02/10/2016   Symptomatic bradycardia 01/12/2016   Essential hypertension 12/22/2015   Elevated troponin    Acute on chronic diastolic CHF (congestive heart failure) ([name])    Anemia of chronic disease 11/08/2015   Hypokalemia 11/08/2015   Tobacco abuse 10/23/2015   Coronary artery disease    DOE (dyspnea on exertion) 04/29/2015   PAF (paroxysmal atrial fibrillation) ([name]) 01/16/2015   Carotid arterial disease ([name]) 01/16/2015   Insomnia 02/03/2014   S/P peripheral artery angioplasty - [place]bo[place]awk atherectomy; R SFA 09/11/2013    Class: Acute   Leg pain, bilateral 08/19/2013   Hypothyroidism 07/31/2013   History of cocaine abuse ([name]) 06/13/2013   Long term current use of anticoagulant therapy 05/20/2013   Alcohol abuse    Narcotic abuse ([name])    Marijuana abuse    Alcoholic cirrhosis ([name])    Type 2 diabetes mellitus with hyperlipidemia ([name])     Current Outpatient Medications:    ACCU-CHEK GUIDE test strip, USE TO CHECK BLOOD SUGAR FOUR TIMES DAILY, Disp: , Rfl:    acetaminophen  (TYLENOL ) 500 MG tablet, Take 1,000 mg by  mouth every 6 (six) hours as needed for headache (pain)., Disp: , Rfl:    albuterol  (VENTOLIN  HFA) 108 (90 Base) MCG/ACT inhaler, USE 2 PUFFS BY MOUTH EVERY FOUR HOURS, AS NEEDED, FOR COUGHING/WHEEZING, Disp: 8.5 g, Rfl: 0   allopurinol  (ZYLOPRIM ) 100 MG tablet, Take 150 mg by mouth daily., Disp: , Rfl:    apixaban  (ELIQUIS ) 5 MG TABS tablet, Take 1 tablet (5 mg total) by mouth 2 (two) times daily., Disp: 60 tablet, Rfl: 6   budesonide -formoterol  (SYMBICORT ) 160-4.5 MCG/ACT  inhaler, Inhale 2 puffs into the lungs 2 (two) times daily., Disp: 1 each, Rfl: 2   buPROPion  ER (WELLBUTRIN  SR) 100 MG 12 hr tablet, Take 1 tablet (100 mg total) by mouth daily., Disp: 30 tablet, Rfl: 6   Cholecalciferol  (VITAMIN D3 PO), Take 1 capsule by mouth every morning., Disp: , Rfl:    EASY COMFORT PEN NEEDLES 31G X 5 MM MISC, USE FOUR TIMES A DAY FOR INSULIN  ADMINISTRATION, Disp: 100 each, Rfl: 1   insulin  lispro (HUMALOG ) 100 UNIT/ML KwikPen, Inject 10 Units into the skin 3 (three) times daily with meals., Disp: 15 mL, Rfl: 0   LANTUS  SOLOSTAR 100 UNIT/ML Solostar Pen, Inject 37 Units into the skin daily. (Patient taking differently: Inject 40 Units into the skin daily.), Disp: 15 mL, Rfl: 0   levothyroxine  (SYNTHROID ) 50 MCG tablet, TAKE 1 TABLET (50 MCG TOTAL) BY MOUTH DAILY BEFORE BREAKFAST (AM), Disp: 30 tablet, Rfl: 0   metolazone  (ZAROXOLYN ) 2.5 MG tablet, Take 1 tablet (2.5 mg total) by mouth once a week. Every Monday, Disp: 15 tablet, Rfl: 3   metoprolol  succinate (TOPROL -XL) 25 MG 24 hr tablet, Take 1 tablet (25 mg total) by mouth daily., Disp: 90 tablet, Rfl: 3   pantoprazole  (PROTONIX ) 40 MG tablet, TAKE 1 TABLET (40 MG TOTAL) BY MOUTH DAILY(AM), Disp: 30 tablet, Rfl: 0   polyethylene glycol (MIRALAX  / GLYCOLAX ) 17 g packet, Take 17 g by mouth daily as needed for moderate constipation., Disp: 14 each, Rfl: 0   potassium chloride  SA (KLOR-CON  M) 20 MEQ tablet, Take 2 tablets (40 mEq total) by mouth once a week. With every dose of metolazone , Disp: , Rfl:    rosuvastatin  (CRESTOR ) 40 MG tablet, TAKE 1 TABLET (40 MG TOTAL) BY MOUTH DAILY, Disp: 15 tablet, Rfl: 0   torsemide  (DEMADEX ) 100 MG tablet, Take 1 tablet (100 mg total) by mouth 2 (two) times daily., Disp: 180 tablet, Rfl: 3   triamcinolone  ointment (KENALOG ) 0.1 %, Apply topically 2 (two) times daily., Disp: 454 g, Rfl: 0   VITAMIN E PO, Take 1 tablet by mouth daily., Disp: , Rfl:    colchicine  0.6 MG tablet, Take 1.2 mg by  mouth See admin instructions. Take 1.2 mg for flair up and an hour later take 0.6 mg if needed (Patient not taking: Reported on 11/28/2023), Disp: , Rfl:    doxycycline  (VIBRAMYCIN ) 100 MG capsule, Take 1 capsule (100 mg total) by mouth 2 (two) times daily. (Patient not taking: Reported on 11/28/2023), Disp: 20 capsule, Rfl: 0   fluticasone  (FLONASE ) 50 MCG/ACT nasal spray, Place 1 spray into both nostrils as needed for allergies or rhinitis. (Patient not taking: Reported on 11/21/2023), Disp: , Rfl:    Furosemide  (FUROSCIX  [place]), Inject into the skin as directed. As needed (Patient not taking: Reported on 11/13/2023), Disp: , Rfl:    losartan  (COZAAR ) 25 MG tablet, Take 1 tablet (25 mg total) by mouth daily., Disp: 30 tablet, Rfl: 0  spironolactone  (ALDACTONE ) 25 MG tablet, Take 1 tablet (25 mg total) by mouth daily., Disp: 90 tablet, Rfl: 3 Allergies  Allergen Reactions   Gabapentin  Nausea And Vomiting and Other (See Comments)    POSSIBLE SHAKING   Lyrica  [Pregabalin ] Other (See Comments)    Shaking       Social History   Socioeconomic History   Marital status: Single    Spouse name: Not on file   Number of children: 1   Years of education: 12   Highest education level: 12th grade  Occupational History   Occupation: disabled  Tobacco Use   Smoking status: Every Day    Current packs/day: 1.00    Average packs/day: 1 pack/day for 44.0 years (44.0 ttl pk-yrs)    Types: E-cigarettes, Cigarettes   Smokeless tobacco: Former    Types: Snuff  Vaping Use   Vaping status: Former   Devices: 11/26/2018 stopped months ago  Substance and Sexual Activity   Alcohol use: Not Currently    Comment: occ   Drug use: Not Currently    Types: Crack cocaine, Marijuana, Oxycodone    Sexual activity: Not Currently  Other Topics Concern   Not on file  Social History Narrative   ** Merged History Encounter **       Lives in [place], in motel with sister.  They are looking to move but don't have a place  to go yet.     Social Drivers of Corporate Investment Banker Strain: Low Risk  (02/09/2023)   Overall Financial Resource Strain (CARDIA)    Difficulty of Paying Living Expenses: Not hard at all  Food Insecurity: No Food Insecurity (10/10/2023)   Hunger Vital Sign    Worried About Running Out of Food in the Last Year: Never true    Ran Out of Food in the Last Year: Never true  Transportation Needs: No Transportation Needs (10/10/2023)   PRAPARE - Administrator, Civil Service (Medical): No    Lack of Transportation (Non-Medical): No  Physical Activity: Inactive (02/09/2023)   Exercise Vital Sign    Days of Exercise per Week: 0 days    Minutes of Exercise per Session: 0 min  Stress: No Stress Concern Present (02/09/2023)   [employer] of Occupational Health - Occupational Stress Questionnaire    Feeling of Stress : Not at all  Social Connections: Unknown (08/21/2023)   Received from [facility]   Social Network    Social Network: Not on file  Intimate Partner Violence: Not At Risk (10/10/2023)   Humiliation, Afraid, Rape, and Kick questionnaire    Fear of Current or Ex-Partner: No    Emotionally Abused: No    Physically Abused: No    Sexually Abused: No    Physical Exam      Future Appointments  Date Time Provider Department Center  12/07/2023 11:30 AM [facility] PA/NP [facility] None  12/12/2023 12:00 PM Inocencio Soyla Lunger, MD [facility]  12/13/2023  2:45 PM Rosan Harlene Fickle, DO [facility]  02/12/2024  4:00 PM [facility] ANNUAL WELLNESS VISIT [facility] None  04/09/2024  3:00 PM [facility] VASC 4 [facility]  04/09/2024  4:00 PM Magda Debby SAILOR, MD [facility]       [name], Paramedic [phone] [facility] Paramedic  11/28/23

## 2023-11-29 NOTE — Progress Notes (Signed)
Patient ID: [name], female   DOB: Dec 12, 1960, 63 y.o.   MRN: [id number]    Advanced Heart Failure Clinic Note   PCP: [facility] PV: Dr. Allyson Sabal HF Cardiology: Dr. Shirlee Latch  CC: Heart failure follow up  Ms Czarnota is a 63 y.o. with history of PAD, carotid stenosis s/p left CEA, CAD, chronic systolic CHF, paroxysmal atrial fibrillation and prior substance abuse.  She was admitted in 1/17 with acute hypoxemic respiratory failure in the setting of atrial fibrillation/RVR and volume overload.  She was initially intubated.  She converted back to NSR with amiodarone gtt.    She was admitted in 3/17 with symptomatic bradycardia.  She was supposed to stop diltiazem after this admission but continued to take it.  She presented later in 3/17, again with symptomatic bradycardia (junctional bradycardia), hypotension, and AKI.  Diltiazem and bisoprolol were stopped.  HR recovered and she has had no problems since.    In 5/18, she had peripheral angiography with atherectomy of right SFA.  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 In 8/20, she had peripheral angiography showing totally occluded left SFA.  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 Arbie Cookey.  ABIs in 2/21 were 0.44 on right, 0.99 on left.   Echo in 1/21 showed EF 50%, mild LVH, normal RV, PASP 60 mmHg.   Echo 03/08/21 EF 55-60% with basal to mid inferolateral hypokinesis.   Underwent PV angiogram with Dr. Allyson Sabal 7/22. She had drug-coated balloon angioplasty of the right SFA after being admitted for critical ischemia of the right foot.  Repeat peripheral arterial dopplers in 8/22 still showed severe right SFA disease.   Admitted 8/22 for bradycardia and AKI, bisoprolol subsequently stopped; amiodarone was continued.   Echo 4/24 EF 50-55%, RV normal.  Admitted 9/24 with UTI/sepsis. Required ICU care, BiPap and abx. Underwent RLE angiogram with  femoropopliteal angioplasty and stenting.  She was admitted in 11/24 with atrial fibrillation/RVR.  Echo showed EF 45-50%, normal RV, mild MR, anterolateral akinesis. She was cardioverted.  She was taken off losartan and spironolactone. Prior to admission, she was on torsemide 100 mg bid and metolazone 2.5 once a week.  She was sent home only on torsemide 100 mg daily and no metolazone.   She got readmitted again for CHF 11/26-11/29. She was diuresed w/ IV Lasix. She was transitioned back to torsemide 100 mg bid but instructed to take metolazone only PRN instead of once weekly. Of note, she was discharged home on 11/29 and her CardioMEMs reading next day on 11/30 showed elevated diastolic PA pressure of 31 mmHg (goal 20), suggesting she was not fully diuresed at d/c.   12/2, she was contacted given elevated CardioMEMs reading, suggesting continued volume overload. Reading was elevated at 37 mmHg. She was instructed to use 80 mg of Furoscix and to return to f/u. Seen for follow-up 12/04. Remained volume overloaded with Diastolic PA pressure of 36 (goal 20). She was wearing furoscix but UOP not as robust as with Torsemide. Switched back to Torsemide 100 BID and instructed to take metolazone 2.5 mg X 2 days. Cleda Daub also increased.   Today she returns for AHF follow up with [name] from paramedicine. Overall feeling good, noticing improvement in her symptoms and fluid. Denies palpitations, CP, dizziness, or PND/Orthopnea. Persistent edema in BLE. Denies SOB. Appetite ok. No fever or chills. Weight at home 205-210 pounds. Taking all medications. Denies ETOH  or drug use. Smokes 3-4 cigarettes/day, has cut back since she has to go outside to her car to smoke.   SH: Lives with sister.  Prior cocaine abuse.  Prior ETOH abuse.  Quit smoking in 1/17, restarted, and quit again in 4/19, restarted again.  FH: CAD  Review of systems complete and found to be negative unless listed in HPI.    Current Outpatient  Medications  Medication Sig Dispense Refill   ACCU-CHEK GUIDE test strip USE TO CHECK BLOOD SUGAR FOUR TIMES DAILY     acetaminophen (TYLENOL) 500 MG tablet Take 1,000 mg by mouth every 6 (six) hours as needed for headache (pain).     albuterol (VENTOLIN HFA) 108 (90 Base) MCG/ACT inhaler USE 2 PUFFS BY MOUTH EVERY FOUR HOURS, AS NEEDED, FOR COUGHING/WHEEZING 8.5 g 0   allopurinol (ZYLOPRIM) 100 MG tablet Take 150 mg by mouth daily.     apixaban (ELIQUIS) 5 MG TABS tablet Take 1 tablet (5 mg total) by mouth 2 (two) times daily. 60 tablet 6   budesonide-formoterol (SYMBICORT) 160-4.5 MCG/ACT inhaler Inhale 2 puffs into the lungs 2 (two) times daily. 1 each 2   buPROPion ER (WELLBUTRIN SR) 100 MG 12 hr tablet Take 1 tablet (100 mg total) by mouth daily. 30 tablet 6   Cholecalciferol (VITAMIN D3 PO) Take 1 capsule by mouth every morning.     colchicine 0.6 MG tablet Take 1.2 mg by mouth See admin instructions. Take 1.2 mg for flair up and an hour later take 0.6 mg if needed     EASY COMFORT PEN NEEDLES 31G X 5 MM MISC USE FOUR TIMES A DAY FOR INSULIN ADMINISTRATION 100 each 1   fluticasone (FLONASE) 50 MCG/ACT nasal spray Place 1 spray into both nostrils as needed for allergies or rhinitis.     Furosemide (FUROSCIX [place]) Inject into the skin as directed. As needed     insulin lispro (HUMALOG) 100 UNIT/ML KwikPen Inject 10 Units into the skin 3 (three) times daily with meals. 15 mL 0   LANTUS SOLOSTAR 100 UNIT/ML Solostar Pen Inject 37 Units into the skin daily. (Patient taking differently: Inject 40 Units into the skin daily.) 15 mL 0   levothyroxine (SYNTHROID) 50 MCG tablet TAKE 1 TABLET (50 MCG TOTAL) BY MOUTH DAILY BEFORE BREAKFAST (AM) 30 tablet 0   losartan (COZAAR) 25 MG tablet Take 1 tablet (25 mg total) by mouth daily. 30 tablet 0   metolazone (ZAROXOLYN) 2.5 MG tablet Take 1 tablet (2.5 mg total) by mouth once a week. Every Monday 15 tablet 3   metoprolol succinate (TOPROL-XL) 25 MG 24 hr  tablet Take 1 tablet (25 mg total) by mouth daily. 90 tablet 3   Omega-3 Fatty Acids (FISH OIL PO) Take 1 tablet by mouth daily.     pantoprazole (PROTONIX) 40 MG tablet TAKE 1 TABLET (40 MG TOTAL) BY MOUTH DAILY(AM) 30 tablet 0   polyethylene glycol (MIRALAX / GLYCOLAX) 17 g packet Take 17 g by mouth daily as needed for moderate constipation. 14 each 0   potassium chloride SA (KLOR-CON M) 20 MEQ tablet Take 2 tablets (40 mEq total) by mouth once a week. With every dose of metolazone     rosuvastatin (CRESTOR) 40 MG tablet TAKE 1 TABLET (40 MG TOTAL) BY MOUTH DAILY (Patient taking differently: Take 40 mg by mouth at bedtime.) 15 tablet 0   spironolactone (ALDACTONE) 25 MG tablet Take 1 tablet (25 mg total) by mouth daily. 90 tablet 3  torsemide (DEMADEX) 100 MG tablet Take 1 tablet (100 mg total) by mouth 2 (two) times daily. 180 tablet 3   triamcinolone ointment (KENALOG) 0.1 % Apply topically 2 (two) times daily. 454 g 0   VITAMIN E PO Take 1 tablet by mouth daily.     No current facility-administered medications for this encounter.   BP 124/60   Pulse 66   Wt 95.9 kg (211 lb 6.4 oz)   LMP  (LMP Unknown)   SpO2 97%   BMI 38.67 kg/m   Wt Readings from Last 3 Encounters:  12/07/23 95.9 kg (211 lb 6.4 oz)  12/06/23 93 kg (205 lb)  11/28/23 94.8 kg (209 lb)   PHYSICAL EXAM: General:  well appearing.  No respiratory difficulty. Arrived in [facility] HEENT: normal Neck: supple. JVD flat. Carotids 2+ bilat; no bruits. No lymphadenopathy or thyromegaly appreciated. Cor: PMI nondisplaced. Regular rate & rhythm. No rubs, gallops or murmurs. Lungs: coarse, diminished bases Abdomen: obese, soft, nontender, nondistended. No hepatosplenomegaly. No bruits or masses. Good bowel sounds. Extremities: no cyanosis, clubbing, rash, trace BLE edema. Chronic BLE wounds. RLE wrapped Neuro: alert & oriented x 3, cranial nerves grossly intact. moves all 4 extremities w/o difficulty. Affect pleasant.    Cardiomems at goal 20 (Personally reviewed)    Assessment/Plan: 1. Chronic HF with mid range EF: LHC in 12/15 showing only 80% stenosis in small OM1. EF as low as 40-45% in the past. Echo 4/22 showed EF 55-60% with grade 2 diastolic dysfunction.  Echo (4/24) EF 50-55%.  Echo in 11/24 with EF 45-50%, normal RV, mild MR, anterolateral akinesis.   - NYHA class III (chronic). Volume improving on increased diuretic regimen. Diastolic PA pressure today is 20 (goal 20). Has been submitting daily CardioMEMs readings  - Continue torsemide 100 mg bid - Continue metolazone once weekly   - Continue Losartan 25 mg daily  - Continue spironolactone 25 mg daily  - She is off Jardiance due to severe UTIs.  - Continue Toprol XL 25 mg daily.  - BMP/BNP today  2. Atrial fibrillation: Paroxysmal. RRR on exam  - Continue Eliquis 5 mg bid. Denies abnormal bleeding. CBC stable last month - Was referred to EP for AF ablation at last visit, has yet to be reviewed  3. PAD: Claudication bilaterally.  Not candidate for cilostazol with CHF.  She has had a left fem-pop bypass in 8/20.  ABIs in 2/21 were very abnormal on right. She underwent PV angiogram with balloon angioplasty of the right SFA 7/22.  8/22 dopplers still showed severe right SFA disease. Dopplers 2/23 showed patent right SFA.  Peripheral arterial dopplers in 2/23 showed 50-74% stenosis right SFA and popliteal.  S/p RLE femoropopliteal angioplasty and stenting in 9/24. She still has bilateral foot ulcers.  - [facility], [facility] 3 weeks. - Continue rosuvastatin.  - She is on apixaban so no ASA.   4. COPD:  Continues to smoke. Continue bupropion.  - Discussed smoking cessation.     5. H/o cirrhosis: Likely from ETOH, she has quit.   6. OSA: Not able to tolerate CPAP.    7. CKD stage III:  Baseline creatinine around 1.5 - BMET today  8. HTN: stable today   9.Type 2 diabetes: Per PCP.  She is on insulin.  10. CAD: LHC in  12/15 with 80% stenosis in small OM1, nonobstructive disease in other territories. No chest pain. - Continue rosuvastatin.  - Not on ASA with need for  Eliquis.  11. Carotid stenosis: Stable 4/24 dopplers.  12. Hyperlipidemia: She is on Crestor and Vascepa.   Follow up with APP in 6-8 weeks.   Alen Bleacher, AGACNP-BC  12/07/2023

## 2023-11-29 NOTE — Progress Notes (Signed)
 ADELEINE, PASK ([id number]) [id number] Page 1 of 18 Visit Report for 11/20/2023 Arrival Information Details Patient Name: Date of Service: [name] 11/20/2023 1:15 PM Medical Record Number: [id number] Patient Account Number: [id number] Date of Birth/Sex: Treating RN: May 05, 1961 (63 y.o. F) Primary Care Jacson Rapaport: Delbert Clam Other Clinician: Referring Shakerra Red: Treating Aul Mangieri/Extender: Rosan Harlene Delbert Clam Weeks in Treatment: 30 Visit Information History Since Last Visit Added or deleted any medications: No Patient Arrived: Wheel Chair Any new allergies or adverse reactions: No Arrival Time: 14:02 Had a fall or experienced change in No Accompanied By: self activities of daily living that may affect Transfer Assistance: None risk of falls: Patient Identification Verified: Yes Signs or symptoms of abuse/neglect since last visito No Secondary Verification Process Completed: Yes Hospitalized since last visit: No Patient Requires Transmission-Based Precautions: No Implantable device outside of the clinic excluding No Patient Has Alerts: Yes cellular tissue based products placed in the center Patient Alerts: ABI R 0.57 (01/11/23) since last visit: ABI L 1.04 (01/11/23) Has Dressing in Place as Prescribed: Yes Pain Present Now: Yes Electronic Signature(s) Signed: 11/27/2023 5:23:50 PM By: Wyn Iha Entered By: Wyn Iha on 11/20/2023 14:02:44 -------------------------------------------------------------------------------- Encounter Discharge Information Details Patient Name: Date of Service: Karla TOI, Karla [name] 11/20/2023 1:15 PM Medical Record Number: [id number] Patient Account Number: [id number] Date of Birth/Sex: Treating RN: [birth year] (63 y.o. [name] Primary Care Khaliya Golinski: Delbert Clam Other Clinician: Referring Tiondra Fang: Treating [place] Lesure/Extender: Rosan Harlene Delbert Clam Weeks in Treatment:  30 Encounter Discharge Information Items Post Procedure Vitals Discharge Condition: Stable Temperature (F): 98 Ambulatory Status: Ambulatory Pulse (bpm): 74 Discharge Destination: Home Respiratory Rate (breaths/min): 18 Transportation: Private Auto Blood Pressure (mmHg): 125/77 Accompanied By: self Schedule Follow-up Appointment: Yes Clinical Summary of Care: Patient Declined Electronic Signature(s) Signed: 11/20/2023 5:26:20 PM By: Cammie Sailors RN, BSN Entered By: Cammie Sailors on 11/20/2023 15:20:10 [name] ([id number]) [id number] Page 2 of 18 -------------------------------------------------------------------------------- Lower Extremity Assessment Details Patient Name: Date of Service: [name] 11/20/2023 1:15 PM Medical Record Number: [id number] Patient Account Number: [id number] Date of Birth/Sex: Treating RN: 09/05/1961 (63 y.o. F) Primary Care Carlyon Nolasco: Delbert Clam Other Clinician: Referring Zachery Niswander: Treating Shanese Riemenschneider/Extender: Rosan Harlene Delbert Clam Weeks in Treatment: 30 Edema Assessment Assessed: [Left: No] [Right: No] Edema: [Left: Yes] [Right: Yes] Calf Left: Right: Point of Measurement: 30 cm From Medial Instep 38.8 cm 39.5 cm Ankle Left: Right: Point of Measurement: 9 cm From Medial Instep 20 cm 20 cm Vascular Assessment Extremity colors, hair growth, and conditions: Extremity Color: [Left:Hyperpigmented] [Right:Hyperpigmented] Hair Growth on Extremity: [Left:No] [Right:No] Temperature of Extremity: [Left:Warm] [Right:Warm] Capillary Refill: [Left:< 3 seconds] [Right:< 3 seconds] Dependent Rubor: [Left:No Yes] [Right:No Yes] Electronic Signature(s) Signed: 11/27/2023 5:23:50 PM By: Wyn Iha Entered By: Wyn Iha on 11/20/2023 14:11:04 -------------------------------------------------------------------------------- Multi Wound Chart Details Patient Name: Date of Service: Karla TOI, Karla [name]  11/20/2023 1:15 PM Medical Record Number: [id number] Patient Account Number: [id number] Date of Birth/Sex: Treating RN: [birth year] (63 y.o. F) Primary Care Benjamin Casanas: Delbert Clam Other Clinician: Referring Delancey Moraes: Treating Elmin Wiederholt/Extender: Rosan Harlene Delbert Clam Weeks in Treatment: 30 Vital Signs Height(in): 64 Capillary Blood Glucose(mg/dl): 850 Weight(lbs): 774 Pulse(bpm): 74 Body Mass Index(BMI): 38.6 Blood Pressure(mmHg): 125/77 Temperature(F): 98 Respiratory Rate(breaths/min): 18 [23:Photos:] [phone].pdf Page 3 of 18] Right Metatarsal head first Right, Anterior Lower Leg Right, Distal, Posterior Lower Leg Wound Location: Gradually Appeared Bump Gradually Appeared Wounding Event: Diabetic Wound/Ulcer of the Lower Trauma, Other Venous Leg Ulcer Primary  Etiology: Extremity N/A N/A N/A Secondary Etiology: Chronic Obstructive Pulmonary Chronic Obstructive Pulmonary Chronic Obstructive Pulmonary Comorbid History: Disease (COPD), Sleep Apnea, Disease (COPD), Sleep Apnea, Disease (COPD), Sleep Apnea, Arrhythmia, Congestive Heart Failure, Arrhythmia, Congestive Heart Failure, Arrhythmia, Congestive Heart Failure, Coronary Artery Disease, Coronary Artery Disease, Coronary Artery Disease, Hypertension, Peripheral Arterial Hypertension, Peripheral Arterial Hypertension, Peripheral Arterial Disease, Cirrhosis , Type II Diabetes, Disease, Cirrhosis , Type II Diabetes, Disease, Cirrhosis , Type II Diabetes, Osteoarthritis, Neuropathy Osteoarthritis, Neuropathy Osteoarthritis, Neuropathy 04/18/2021 08/20/2023 09/06/2023 Date Acquired: 30 12 10  Weeks of Treatment: Open Healed - Epithelialized Healed - Epithelialized Wound Status: No No No Wound Recurrence: No Yes No Clustered Wound: N/A 0 N/A Clustered Quantity: 0.2x0.3x0.2 0x0x0 0x0x0 Measurements L x W x D (cm) 0.047 0 0 A (cm) : rea 0.009 0 0 Volume (cm) : 95.00% 100.00%  100.00% % Reduction in A rea: 98.10% 100.00% 100.00% % Reduction in Volume: Grade 2 Full Thickness Without Exposed Full Thickness Without Exposed Classification: Support Structures Support Structures Medium None Present None Present Exudate A mount: Serosanguineous N/A N/A Exudate Type: red, brown N/A N/A Exudate Color: Distinct, outline attached Distinct, outline attached Distinct, outline attached Wound Margin: None Present (0%) None Present (0%) None Present (0%) Granulation A mount: N/A N/A N/A Granulation Quality: Large (67-100%) Small (1-33%) None Present (0%) Necrotic A mount: Eschar, Adherent Slough Adherent Slough N/A Necrotic Tissue: Fat Layer (Subcutaneous Tissue): Yes Fascia: No Fascia: No Exposed Structures: Fascia: No Fat Layer (Subcutaneous Tissue): No Fat Layer (Subcutaneous Tissue): No Tendon: No Tendon: No Tendon: No Muscle: No Muscle: No Muscle: No Joint: No Joint: No Joint: No Bone: No Bone: No Bone: No None Large (67-100%) Large (67-100%) Epithelialization: Debridement - Excisional N/A N/A Debridement: Pre-procedure Verification/Time Out 14:45 N/A N/A Taken: Lidocaine  4% Topical Solution N/A N/A Pain Control: Subcutaneous, Slough N/A N/A Tissue Debrided: Skin/Subcutaneous Tissue N/A N/A Level: 0.05 N/A N/A Debridement A (sq cm): rea Curette N/A N/A Instrument: Minimum N/A N/A Bleeding: Pressure N/A N/A Hemostasis A chieved: Procedure was tolerated well N/A N/A Debridement Treatment Response: 0.2x0.3x0.2 N/A N/A Post Debridement Measurements L x W x D (cm) 0.009 N/A N/A Post Debridement Volume: (cm) Callus: Yes Excoriation: No Excoriation: No Periwound Skin Texture: Induration: No Induration: No Callus: No Callus: No Crepitus: No Crepitus: No Rash: No Rash: No Scarring: No Scarring: No Maceration: No Maceration: No Maceration: No Periwound Skin Moisture: Dry/Scaly: No Dry/Scaly: No Dry/Scaly: No No  Abnormalities Noted Atrophie Blanche: No Atrophie Blanche: No Periwound Skin Color: Cyanosis: No Cyanosis: No Ecchymosis: No Ecchymosis: No Erythema: No Erythema: No Hemosiderin Staining: No Hemosiderin Staining: No Mottled: No Mottled: No Pallor: No Pallor: No Rubor: No Rubor: No No Abnormality No Abnormality No Abnormality Temperature: Debridement N/A N/A Procedures Performed: Wound Number: [id number] Photos: [name], [name] ([id number]) [id number] Page 4 of 18 Left, Lateral Lower Leg Left, Proximal, Lateral Lower Leg Left, Distal, Lateral Lower Leg Wound Location: Blister Gradually Appeared Gradually Appeared Wounding Event: 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 Primary Etiology: Extremity N/A Venous Leg Ulcer N/A Secondary Etiology: Chronic Obstructive Pulmonary Chronic Obstructive Pulmonary Chronic Obstructive Pulmonary Comorbid History: Disease (COPD), Sleep Apnea, Disease (COPD), Sleep Apnea, Disease (COPD), Sleep Apnea, Arrhythmia, Congestive Heart Failure, Arrhythmia, Congestive Heart Failure, Arrhythmia, Congestive Heart Failure, Coronary Artery Disease, Coronary Artery Disease, Coronary Artery Disease, Hypertension, Peripheral Arterial Hypertension, Peripheral Arterial Hypertension, Peripheral Arterial Disease, Cirrhosis , Type II Diabetes, Disease, Cirrhosis , Type II Diabetes, Disease, Cirrhosis , Type  II Diabetes, Osteoarthritis, Neuropathy Osteoarthritis, Neuropathy Osteoarthritis, Neuropathy 10/04/2023 10/19/2023 10/26/2023 Date A cquired: 6 4 3  Weeks of Treatment: Healed - Epithelialized Healed - Epithelialized Healed - Epithelialized Wound Status: No No No Wound Recurrence: No No No Clustered Wound: N/A N/A N/A Clustered Quantity: 0x0x0 0x0x0 0x0x0 Measurements L x W x D (cm) 0 0 0 A (cm) : rea 0 0 0 Volume (cm) : 100.00% 100.00% 100.00% % Reduction in A rea: 100.00% 100.00% 100.00% %  Reduction in Volume: Full Thickness Without Exposed Grade 1 Full Thickness Without Exposed Classification: Support Structures Support Structures None Present None Present None Present Exudate A mount: N/A N/A N/A Exudate Type: N/A N/A N/A Exudate Color: N/A Flat and Intact N/A Wound Margin: None Present (0%) None Present (0%) None Present (0%) Granulation A mount: N/A N/A N/A Granulation Quality: None Present (0%) None Present (0%) None Present (0%) Necrotic A mount: N/A N/A N/A Necrotic Tissue: Fascia: No Fascia: No Fascia: No Exposed Structures: Fat Layer (Subcutaneous Tissue): No Fat Layer (Subcutaneous Tissue): No Fat Layer (Subcutaneous Tissue): No Tendon: No Tendon: No Tendon: No Muscle: No Muscle: No Muscle: No Joint: No Joint: No Joint: No Bone: No Bone: No Bone: No Large (67-100%) Large (67-100%) Large (67-100%) Epithelialization: N/A N/A N/A Debridement: N/A N/A N/A Pain Control: N/A N/A N/A Tissue Debrided: N/A N/A N/A Level: N/A N/A N/A Debridement A (sq cm): rea N/A N/A N/A Instrument: N/A N/A N/A Bleeding: N/A N/A N/A Hemostasis A chieved: Debridement Treatment Response: N/A N/A N/A Post Debridement Measurements L x N/A N/A N/A W x D (cm) N/A N/A N/A Post Debridement Volume: (cm) Excoriation: No Excoriation: No Excoriation: No Periwound Skin Texture: Induration: No Induration: No Induration: No Callus: No Callus: No Callus: No Crepitus: No Crepitus: No Crepitus: No Rash: No Rash: No Rash: No Scarring: No Scarring: No Scarring: No Maceration: No Maceration: No Maceration: No Periwound Skin Moisture: Dry/Scaly: No Dry/Scaly: No Dry/Scaly: No Atrophie Blanche: No Atrophie Blanche: No Atrophie Blanche: No Periwound Skin Color: Cyanosis: No Cyanosis: No Cyanosis: No Ecchymosis: No Ecchymosis: No Ecchymosis: No Erythema: No Erythema: No Erythema: No Hemosiderin Staining: No Hemosiderin Staining:  No Hemosiderin Staining: No Mottled: No Mottled: No Mottled: No Pallor: No Pallor: No Pallor: No Rubor: No Rubor: No Rubor: No No Abnormality No Abnormality N/A Temperature: N/A Yes N/A Tenderness on Palpation: N/A N/A N/A Procedures Performed: Wound Number: [id number] Photos: Left Gluteal fold Left Hand - Dorsum Right Gluteus Wound Location: Pressure Injury Skin Tear/Laceration Gradually Appeared Wounding EventALMENA, HOKENSON ([id number]) [id number] Page 5 of 18 Pressure Ulcer Skin T ear Pressure Ulcer Primary Etiology: N/A N/A N/A Secondary Etiology: Chronic Obstructive Pulmonary Chronic Obstructive Pulmonary Chronic Obstructive Pulmonary Comorbid History: Disease (COPD), Sleep Apnea, Disease (COPD), Sleep Apnea, Disease (COPD), Sleep Apnea, Arrhythmia, Congestive Heart Failure, Arrhythmia, Congestive Heart Failure, Arrhythmia, Congestive Heart Failure, Coronary Artery Disease, Coronary Artery Disease, Coronary Artery Disease, Hypertension, Peripheral Arterial Hypertension, Peripheral Arterial Hypertension, Peripheral Arterial Disease, Cirrhosis , Type II Diabetes, Disease, Cirrhosis , Type II Diabetes, Disease, Cirrhosis , Type II Diabetes, Osteoarthritis, Neuropathy Osteoarthritis, Neuropathy Osteoarthritis, Neuropathy 10/26/2023 11/19/2023 09/15/2023 Date A cquired: 3 0 0 Weeks of Treatment: Healed - Epithelialized Open Open Wound Status: No No No Wound Recurrence: No No Yes Clustered Wound: N/A N/A 3 Clustered Quantity: 0x0x0 1.2x2.3x0.1 1.5x3.2x0.1 Measurements L x W x D (cm) 0 2.168 3.77 A (cm) : rea 0 0.217 0.377 Volume (cm) : 100.00% N/A N/A % Reduction in A rea: 100.00% N/A  N/A % Reduction in Volume: Category/Stage II Full Thickness Without Exposed Category/Stage I Classification: Support Structures None Present Medium Medium Exudate A mount: N/A Serosanguineous Serosanguineous Exudate Type: N/A red, brown red,  brown Exudate Color: N/A N/A N/A Wound Margin: None Present (0%) Medium (34-66%) Large (67-100%) Granulation A mount: N/A Red N/A Granulation Quality: None Present (0%) Medium (34-66%) N/A Necrotic A mount: N/A Adherent Slough N/A Necrotic Tissue: Fascia: No Fat Layer (Subcutaneous Tissue): Yes Fat Layer (Subcutaneous Tissue): Yes Exposed Structures: Fat Layer (Subcutaneous Tissue): No Fascia: No Fascia: No Tendon: No Tendon: No Tendon: No Muscle: No Muscle: No Muscle: No Joint: No Joint: No Joint: No Bone: No Bone: No Bone: No Large (67-100%) None None Epithelialization: N/A N/A N/A Debridement: N/A N/A N/A Pain Control: N/A N/A N/A Tissue Debrided: N/A N/A N/A Level: N/A N/A N/A Debridement A (sq cm): rea N/A N/A N/A Instrument: N/A N/A N/A Bleeding: N/A N/A N/A Hemostasis A chieved: Debridement Treatment Response: N/A N/A N/A Post Debridement Measurements L x N/A N/A N/A W x D (cm) N/A N/A N/A Post Debridement Volume: (cm) Excoriation: No Periwound Skin Texture: Induration: No Callus: No Crepitus: No Rash: No Scarring: No Maceration: No Periwound Skin Moisture: Dry/Scaly: No Atrophie Blanche: No Periwound Skin Color: Cyanosis: No Ecchymosis: No Erythema: No Hemosiderin Staining: No Mottled: No Pallor: No Rubor: No N/A N/A N/A Temperature: N/A N/A N/A Procedures Performed: Treatment Notes Wound #23 (Metatarsal head first) Wound Laterality: Right Cleanser Soap and Water  Discharge Instruction: May shower and wash wound with dial  antibacterial soap and water  prior to dressing change. Wound Cleanser Discharge Instruction: Cleanse the wound with wound cleanser prior to applying a clean dressing using gauze sponges, not tissue or cotton balls. Peri-Wound Care Sween Lotion (Moisturizing lotion) Discharge Instruction: Apply moisturizing lotion as directed Topical AYRIS, CARANO ([id number]) [id number] Page 6 of  18 Primary Dressing Maxorb Extra Ag+ Alginate Dressing, 2x2 (in/in) Discharge Instruction: Apply to wound bed as instructed Secondary Dressing ABD Pad, 8x10 Discharge Instruction: Apply over primary dressing as directed. Secured With [employer] 4x5 (in/yd) Discharge Instruction: Secure with Coban as directed. Kerlix Roll Sterile, 4.5x3.1 (in/yd) Discharge Instruction: Secure with Kerlix as directed. 56M Medipore H Soft Cloth Surgical T ape, 4 x 10 (in/yd) Discharge Instruction: Secure with tape as directed. Compression Wrap Compression Stockings Add-Ons Wound #37 (Hand - Dorsum) Wound Laterality: Left Cleanser Soap and Water  Discharge Instruction: May shower and wash wound with dial  antibacterial soap and water  prior to dressing change. Peri-Wound Care Topical Triple Antibiotic Ointment, 1 (oz) Tube Primary Dressing Secondary Dressing Bordered Gauze, 4x4 in Discharge Instruction: Apply over primary dressing as directed. Secured With Compression Wrap Compression Stockings Add-Ons Wound #38 (Gluteus) Wound Laterality: Right Cleanser Soap and Water  Discharge Instruction: May shower and wash wound with dial  antibacterial soap and water  prior to dressing change. Peri-Wound Care Topical Triple Antibiotic Ointment, 1 (oz) Tube Primary Dressing Secondary Dressing Zetuvit Plus Silicone Border Dressing 5x5 (in/in) Discharge Instruction: Apply silicone border over primary dressing as directed. Secured With Compression Wrap Compression Stockings Add-Ons Electronic Signature(s) Signed: 11/22/2023 5:26:35 PM By: Rosan Raisin Karla Entered By: Rosan Raisin on 11/20/2023 16:38:32 [name] ([id number]) [id number] Page 7 of 18 -------------------------------------------------------------------------------- Multi-Disciplinary Care Plan Details Patient Name: Date of Service: [name] 11/20/2023 1:15 PM Medical Record Number:  [id number] Patient Account Number: [id number] Date of Birth/Sex: Treating RN: 1961/08/30 (63 y.o. [name] Primary Care Jaydn Fincher: Delbert Clam Other Clinician: Referring Sheana Bir: Treating Navon Kotowski/Extender: Rosan,  Harlene Sabin, [name] Weeks in Treatment: 30 Multidisciplinary Care Plan reviewed with physician Active Inactive Wound/Skin Impairment Nursing Diagnoses: Impaired tissue integrity Goals: Patient/caregiver will verbalize understanding of skin care regimen Date Initiated: 04/19/2023 Target Resolution Date: 12/15/2023 Goal Status: Active Interventions: Assess ulceration(s) every visit Treatment Activities: Skin care regimen initiated : 04/19/2023 Notes: Electronic Signature(s) Signed: 11/20/2023 5:26:20 PM By: Cammie Sailors RN, BSN Entered By: Cammie Sailors on 11/20/2023 15:18:37 -------------------------------------------------------------------------------- Pain Assessment Details Patient Name: Date of Service: Karla KNIGHTS, Karla [name] 11/20/2023 1:15 PM Medical Record Number: [id number] Patient Account Number: [id number] Date of Birth/Sex: Treating RN: Jul 21, 1961 (63 y.o. F) Primary Care Guilherme Schwenke: Sabin Clam Other Clinician: Referring [name] Mcwhirter: Treating Santita Hunsberger/Extender: Rosan Harlene Sabin Clam Weeks in Treatment: 30 Active Problems Location of Pain Severity and Description of Pain Patient Has Paino Yes Site Locations Pain LocationFAY, BAGG ([id number]) [id number] Page 8 of 18 Pain Location: Generalized Pain, Pain in Ulcers Rate the pain. Current Pain Level: 9 Pain Management and Medication Current Pain Management: Electronic Signature(s) Signed: 11/27/2023 5:23:50 PM By: Wyn Iha Entered By: Wyn Iha on 11/20/2023 14:04:36 -------------------------------------------------------------------------------- Patient/Caregiver Education Details Patient Name: Date of Service: [name]  1/6/2025andnbsp1:15 PM Medical Record Number: [id number] Patient Account Number: [id number] Date of Birth/Gender: Treating RN: 1961-05-17 (63 y.o. [name] Primary Care Physician: Sabin Clam Other Clinician: Referring Physician: Treating Physician/Extender: Rosan Harlene Sabin Clam Weeks in Treatment: 30 Education Assessment Education Provided To: Patient Education Topics Provided Wound/Skin Impairment: Methods: Explain/Verbal Responses: State content correctly Electronic Signature(s) Signed: 11/20/2023 5:26:20 PM By: Cammie Sailors RN, BSN Entered By: Cammie Sailors on 11/20/2023 15:18:51 -------------------------------------------------------------------------------- Wound Assessment Details Patient Name: Date of Service: [name] ROSALEA SHERWOOD S. 11/20/2023 1:15 PM Medical Record Number: [id number] Patient Account Number: [id number] Date of Birth/Sex: Treating RN: Nov 30, 1960 (63 y.o. F) Primary Care Yuriy Cui: Sabin Clam Other Clinician: Referring Ting Cage: Treating Jamelah Sitzer/Extender: Rosan Harlene Sabin Clam Ezel, DARALYN Price ([id number]) [id number] Page 9 of 18 Weeks in Treatment: 30 Wound Status Wound Number: 23 Primary Diabetic Wound/Ulcer of the Lower Extremity Etiology: Wound Location: Right Metatarsal head first Wound Open Wounding Event: Gradually Appeared Status: Date Acquired: 04/18/2021 Comorbid Chronic Obstructive Pulmonary Disease (COPD), Sleep Apnea, Weeks Of Treatment: 30 History: Arrhythmia, Congestive Heart Failure, Coronary Artery Disease, Clustered Wound: No Hypertension, Peripheral Arterial Disease, Cirrhosis , Type II Diabetes, Osteoarthritis, Neuropathy Photos Wound Measurements Length: (cm) 0.2 Width: (cm) 0.3 Depth: (cm) 0.2 Area: (cm) 0.047 Volume: (cm) 0.009 % Reduction in Area: 95% % Reduction in Volume: 98.1% Epithelialization: None Tunneling: No Undermining: No Wound  Description Classification: Grade 2 Wound Margin: Distinct, outline attached Exudate Amount: Medium Exudate Type: Serosanguineous Exudate Color: red, brown Foul Odor After Cleansing: No Slough/Fibrino Yes Wound Bed Granulation Amount: None Present (0%) Exposed Structure Necrotic Amount: Large (67-100%) Fascia Exposed: No Necrotic Quality: Eschar, Adherent Slough Fat Layer (Subcutaneous Tissue) Exposed: Yes Tendon Exposed: No Muscle Exposed: No Joint Exposed: No Bone Exposed: No Periwound Skin Texture Texture Color No Abnormalities Noted: No No Abnormalities Noted: Yes Callus: Yes Temperature / Pain Temperature: No Abnormality Moisture No Abnormalities Noted: No Dry / Scaly: No Maceration: No Electronic Signature(s) Signed: 11/27/2023 5:23:50 PM By: Wyn Iha Entered By: Wyn Iha on 11/20/2023 14:28:03 -------------------------------------------------------------------------------- Wound Assessment Details Patient Name: Date of Service: Karla KNIGHTS, Karla [name] 11/20/2023 1:15 PM [name] ([id number]) [id number] Page 10 of 18 Medical Record Number: [id number] Patient Account Number: [id number] Date of Birth/Sex: Treating RN: 12-12-1960 (63 y.o. F) Primary Care Daishawn Lauf:  Delbert Clam Other Clinician: Referring Sydney Azure: Treating Tanner Vigna/Extender: Rosan Harlene Delbert Clam Weeks in Treatment: 30 Wound Status Wound Number: 29 Primary Trauma, Other Etiology: Wound Location: Right, Anterior Lower Leg Wound Healed - Epithelialized Wounding Event: Bump Status: Date Acquired: 08/20/2023 Comorbid Chronic Obstructive Pulmonary Disease (COPD), Sleep Apnea, Weeks Of Treatment: 12 History: Arrhythmia, Congestive Heart Failure, Coronary Artery Disease, Clustered Wound: Yes Hypertension, Peripheral Arterial Disease, Cirrhosis , Type II Diabetes, Osteoarthritis, Neuropathy Photos Wound Measurements Length: (cm) Width: (cm) Depth:  (cm) Clustered Quantity: Area: (cm) Volume: (cm) 0 % Reduction in Area: 100% 0 % Reduction in Volume: 100% 0 Epithelialization: Large (67-100%) 0 Tunneling: No 0 Undermining: No 0 Wound Description Classification: Full Thickness Without Exposed Sup Wound Margin: Distinct, outline attached Exudate Amount: None Present port Structures Foul Odor After Cleansing: No Slough/Fibrino No Wound Bed Granulation Amount: None Present (0%) Exposed Structure Necrotic Amount: Small (1-33%) Fascia Exposed: No Necrotic Quality: Adherent Slough Fat Layer (Subcutaneous Tissue) Exposed: No Tendon Exposed: No Muscle Exposed: No Joint Exposed: No Bone Exposed: No Periwound Skin Texture Texture Color No Abnormalities Noted: No No Abnormalities Noted: No Callus: No Atrophie Blanche: No Crepitus: No Cyanosis: No Excoriation: No Ecchymosis: No Induration: No Erythema: No Rash: No Hemosiderin Staining: No Scarring: No Mottled: No Pallor: No Moisture Rubor: No No Abnormalities Noted: No Dry / Scaly: No Temperature / Pain Maceration: No Temperature: No Abnormality Electronic Signature(s) Signed: 11/27/2023 5:23:50 PM By: Wyn Iha Entered By: Wyn Iha on 11/20/2023 14:13:27 [name] ([id number]) [id number] Page 11 of 18 -------------------------------------------------------------------------------- Wound Assessment Details Patient Name: Date of Service: [name] 11/20/2023 1:15 PM Medical Record Number: [id number] Patient Account Number: [id number] Date of Birth/Sex: Treating RN: 03-24-1961 (63 y.o. F) Primary Care Verdelle Valtierra: Delbert Clam Other Clinician: Referring Colleen Kotlarz: Treating Fitz Matsuo/Extender: Rosan Harlene Delbert Clam Weeks in Treatment: 30 Wound Status Wound Number: 30 Primary Venous Leg Ulcer Etiology: Wound Location: Right, Distal, Posterior Lower Leg Wound Healed - Epithelialized Wounding Event: Gradually  Appeared Status: Date Acquired: 09/06/2023 Comorbid Chronic Obstructive Pulmonary Disease (COPD), Sleep Apnea, Weeks Of Treatment: 10 History: Arrhythmia, Congestive Heart Failure, Coronary Artery Disease, Clustered Wound: No Hypertension, Peripheral Arterial Disease, Cirrhosis , Type II Diabetes, Osteoarthritis, Neuropathy Photos Wound Measurements Length: (cm) Width: (cm) Depth: (cm) Area: (cm) Volume: (cm) 0 % Reduction in Area: 100% 0 % Reduction in Volume: 100% 0 Epithelialization: Large (67-100%) 0 Tunneling: No 0 Undermining: No Wound Description Classification: Full Thickness Without Exposed Support Wound Margin: Distinct, outline attached Exudate Amount: None Present Structures Foul Odor After Cleansing: No Slough/Fibrino No Wound Bed Granulation Amount: None Present (0%) Exposed Structure Necrotic Amount: None Present (0%) Fascia Exposed: No Fat Layer (Subcutaneous Tissue) Exposed: No Tendon Exposed: No Muscle Exposed: No Joint Exposed: No Bone Exposed: No Periwound Skin Texture Texture Color No Abnormalities Noted: No No Abnormalities Noted: No Callus: No Atrophie Blanche: No Crepitus: No Cyanosis: No Excoriation: No Ecchymosis: No Induration: No Erythema: No Rash: No Hemosiderin Staining: No Scarring: No Mottled: No Pallor: No Moisture Rubor: No No Abnormalities Noted: No Dry / Scaly: No Temperature / Pain Maceration: No Temperature: No Abnormality MARQUITA, LIAS ([id number]) [id number] Page 12 of 18 Electronic Signature(s) Signed: 11/27/2023 5:23:50 PM By: Wyn Iha Entered By: Wyn Iha on 11/20/2023 14:15:22 -------------------------------------------------------------------------------- Wound Assessment Details Patient Name: Date of Service: [name] 11/20/2023 1:15 PM Medical Record Number: [id number] Patient Account Number: [id number] Date of Birth/Sex: Treating RN: [birth year] (63 y.o.  F) Primary Care Rossie Scarfone: Newlin,  [name] Other Clinician: Referring Anahy Esh: Treating Laguana Desautel/Extender: Rosan Harlene Delbert Corrina Weeks in Treatment: 30 Wound Status Wound Number: 32 Primary Venous Leg Ulcer Etiology: Wound Location: Left, Lateral Lower Leg Wound Healed - Epithelialized Wounding Event: Blister Status: Date Acquired: 10/04/2023 Comorbid Chronic Obstructive Pulmonary Disease (COPD), Sleep Apnea, Weeks Of Treatment: 6 History: Arrhythmia, Congestive Heart Failure, Coronary Artery Disease, Clustered Wound: No Hypertension, Peripheral Arterial Disease, Cirrhosis , Type II Diabetes, Osteoarthritis, Neuropathy Photos Wound Measurements Length: (cm) Width: (cm) Depth: (cm) Area: (cm) Volume: (cm) 0 % Reduction in Area: 100% 0 % Reduction in Volume: 100% 0 Epithelialization: Large (67-100%) 0 Tunneling: No 0 Undermining: No Wound Description Classification: Full Thickness Without Exposed Support Structures Exudate Amount: None Present Foul Odor After Cleansing: No Slough/Fibrino No Wound Bed Granulation Amount: None Present (0%) Exposed Structure Necrotic Amount: None Present (0%) Fascia Exposed: No Fat Layer (Subcutaneous Tissue) Exposed: No Tendon Exposed: No Muscle Exposed: No Joint Exposed: No Bone Exposed: No Periwound Skin Texture Texture Color No Abnormalities Noted: Yes No Abnormalities Noted: No Atrophie Blanche: No Moisture Cyanosis: No No Abnormalities Noted: Yes Ecchymosis: No Erythema: No Hemosiderin Staining: No Mottled: No TAJUANA, KNISKERN ([id number]) [id number] Page 13 of 18 Pallor: No Rubor: No Temperature / Pain Temperature: No Abnormality Electronic Signature(s) Signed: 11/27/2023 5:23:50 PM By: Wyn Iha Entered By: Wyn Iha on 11/20/2023 14:17:19 -------------------------------------------------------------------------------- Wound Assessment Details Patient Name: Date of  Service: [name] ROSALEA SHERWOOD S. 11/20/2023 1:15 PM Medical Record Number: [id number] Patient Account Number: [id number] Date of Birth/Sex: Treating RN: August 23, 1961 (63 y.o. F) Primary Care Orvin Netter: Delbert Corrina Other Clinician: Referring Findley Blankenbaker: Treating Jacquette Canales/Extender: Rosan Harlene Delbert Corrina Weeks in Treatment: 30 Wound Status Wound Number: 34 Primary Diabetic Wound/Ulcer of the Lower Extremity Etiology: Wound Location: Left, Proximal, Lateral Lower Leg Secondary Venous Leg Ulcer Wounding Event: Gradually Appeared Etiology: Date Acquired: 10/19/2023 Wound Healed - Epithelialized Weeks Of Treatment: 4 Status: Clustered Wound: No Comorbid Chronic Obstructive Pulmonary Disease (COPD), Sleep Apnea, History: Arrhythmia, Congestive Heart Failure, Coronary Artery Disease, Hypertension, Peripheral Arterial Disease, Cirrhosis , Type II Diabetes, Osteoarthritis, Neuropathy Photos Wound Measurements Length: (cm) Width: (cm) Depth: (cm) Area: (cm) Volume: (cm) 0 % Reduction in Area: 100% 0 % Reduction in Volume: 100% 0 Epithelialization: Large (67-100%) 0 Tunneling: No 0 Undermining: No Wound Description Classification: Grade 1 Wound Margin: Flat and Intact Exudate Amount: None Present Foul Odor After Cleansing: No Slough/Fibrino No Wound Bed Granulation Amount: None Present (0%) Exposed Structure Necrotic Amount: None Present (0%) Fascia Exposed: No Fat Layer (Subcutaneous Tissue) Exposed: No Tendon Exposed: No Muscle Exposed: No Joint Exposed: No Bone Exposed: No [address] AUDA, FINFROCK ([id number]) [id number] Page 14 of 18 Texture Color No Abnormalities Noted: Yes No Abnormalities Noted: Yes Moisture Temperature / Pain No Abnormalities Noted: Yes Temperature: No Abnormality Tenderness on Palpation: Yes Electronic Signature(s) Signed: 11/27/2023 5:23:50 PM By: Wyn Iha Entered By: Wyn Iha on 11/20/2023  14:18:44 -------------------------------------------------------------------------------- Wound Assessment Details Patient Name: Date of Service: [name] ROSALEA SHERWOOD S. 11/20/2023 1:15 PM Medical Record Number: [id number] Patient Account Number: [id number] Date of Birth/Sex: Treating RN: 10/07/1961 (63 y.o. F) Primary Care Cordelro Gautreau: Delbert Corrina Other Clinician: Referring Dedrick Heffner: Treating Taniqua Issa/Extender: Rosan Harlene Delbert Corrina Weeks in Treatment: 30 Wound Status Wound Number: 35 Primary Venous Leg Ulcer Etiology: Wound Location: Left, Distal, Lateral Lower Leg Wound Healed - Epithelialized Wounding Event: Gradually Appeared Status: Date Acquired: 10/26/2023 Comorbid Chronic Obstructive Pulmonary Disease (COPD), Sleep Apnea, Weeks Of Treatment: 3 History: Arrhythmia, Congestive  Heart Failure, Coronary Artery Disease, Clustered Wound: No Hypertension, Peripheral Arterial Disease, Cirrhosis , Type II Diabetes, Osteoarthritis, Neuropathy Photos Wound Measurements Length: (cm) Width: (cm) Depth: (cm) Area: (cm) Volume: (cm) 0 % Reduction in Area: 100% 0 % Reduction in Volume: 100% 0 Epithelialization: Large (67-100%) 0 Tunneling: No 0 Undermining: No Wound Description Classification: Full Thickness Without Exposed Support Structures Exudate Amount: None Present Foul Odor After Cleansing: No Slough/Fibrino No Wound Bed Granulation Amount: None Present (0%) Exposed Structure Necrotic Amount: None Present (0%) Fascia Exposed: No Fat Layer (Subcutaneous Tissue) Exposed: No Tendon Exposed: No Muscle Exposed: No Joint Exposed: No Bone Exposed: No [address] ELIYA, BUBAR ([id number]) [id number] Page 15 of 18 Texture Color No Abnormalities Noted: No No Abnormalities Noted: No Callus: No Atrophie Blanche: No Crepitus: No Cyanosis: No Excoriation: No Ecchymosis: No Induration: No Erythema: No Rash: No Hemosiderin  Staining: No Scarring: No Mottled: No Pallor: No Moisture Rubor: No No Abnormalities Noted: No Dry / Scaly: No Maceration: No Electronic Signature(s) Signed: 11/27/2023 5:23:50 PM By: Wyn Iha Entered By: Wyn Iha on 11/20/2023 14:19:57 -------------------------------------------------------------------------------- Wound Assessment Details Patient Name: Date of Service: [name] ROSALEA SHERWOOD S. 11/20/2023 1:15 PM Medical Record Number: [id number] Patient Account Number: [id number] Date of Birth/Sex: Treating RN: 1960-12-21 (63 y.o. F) Primary Care Zionna Homewood: Delbert Clam Other Clinician: Referring Aislee Landgren: Treating Anniah Glick/Extender: Rosan Harlene Delbert Clam Weeks in Treatment: 30 Wound Status Wound Number: 36 Primary Pressure Ulcer Etiology: Wound Location: Left Gluteal fold Wound Healed - Epithelialized Wounding Event: Pressure Injury Status: Date Acquired: 10/26/2023 Comorbid Chronic Obstructive Pulmonary Disease (COPD), Sleep Apnea, Weeks Of Treatment: 3 History: Arrhythmia, Congestive Heart Failure, Coronary Artery Disease, Clustered Wound: No Hypertension, Peripheral Arterial Disease, Cirrhosis , Type II Diabetes, Osteoarthritis, Neuropathy Photos Wound Measurements Length: (cm) Width: (cm) Depth: (cm) Area: (cm) Volume: (cm) 0 % Reduction in Area: 100% 0 % Reduction in Volume: 100% 0 Epithelialization: Large (67-100%) 0 Tunneling: No 0 Undermining: No Wound Description Classification: Category/Stage II Exudate Amount: None Present Foul Odor After Cleansing: No Slough/Fibrino No Wound Bed Granulation Amount: None Present (0%) Exposed Structure Necrotic Amount: None Present (0%) Fascia Exposed: No Fat Layer (Subcutaneous Tissue) Exposed: No SHAQUAYLA, KLIMAS ([id number]) [id number] Page 16 of 18 Tendon Exposed: No Muscle Exposed: No Joint Exposed: No Bone Exposed: No Periwound Skin Texture Texture Color No  Abnormalities Noted: No No Abnormalities Noted: No Moisture No Abnormalities Noted: No Electronic Signature(s) Signed: 11/20/2023 5:26:20 PM By: Cammie Sailors RN, BSN Entered By: Cammie Sailors on 11/20/2023 14:53:05 -------------------------------------------------------------------------------- Wound Assessment Details Patient Name: Date of Service: [name] ROSALEA SHERWOOD S. 11/20/2023 1:15 PM Medical Record Number: [id number] Patient Account Number: [id number] Date of Birth/Sex: Treating RN: Jan 16, 1961 (63 y.o. F) Primary Care Athanasius Kesling: Delbert Clam Other Clinician: Referring Huma Imhoff: Treating Monte Bronder/Extender: Rosan Harlene Delbert Clam Weeks in Treatment: 30 Wound Status Wound Number: 37 Primary Skin T ear Etiology: Wound Location: Left Hand - Dorsum Wound Open Wounding Event: Skin Tear/Laceration Status: Date Acquired: 11/19/2023 Comorbid Chronic Obstructive Pulmonary Disease (COPD), Sleep Apnea, Weeks Of Treatment: 0 History: Arrhythmia, Congestive Heart Failure, Coronary Artery Disease, Clustered Wound: No Hypertension, Peripheral Arterial Disease, Cirrhosis , Type II Diabetes, Osteoarthritis, Neuropathy Photos Wound Measurements Length: (cm) 1.2 Width: (cm) 2.3 Depth: (cm) 0.1 Area: (cm) 2.168 Volume: (cm) 0.217 % Reduction in Area: % Reduction in Volume: Epithelialization: None Tunneling: No Undermining: No Wound Description Classification: Full Thickness Without Exposed Suppor Exudate Amount: Medium Exudate Type: Serosanguineous Exudate Color: red, brown t Structures Foul  Odor After Cleansing: No Slough/Fibrino Yes Wound Bed Granulation Amount: Medium (34-66%) Exposed Structure Granulation Quality: Red Fascia Exposed: No Necrotic Amount: Medium (34-66%) Fat Layer (Subcutaneous Tissue) Exposed: Yes JOAQUINA, NISSEN ([id number]) [id number] Page 17 of 18 Necrotic Quality: Adherent Slough Tendon Exposed: No Muscle Exposed:  No Joint Exposed: No Bone Exposed: No Periwound Skin Texture Texture Color No Abnormalities Noted: No No Abnormalities Noted: No Moisture No Abnormalities Noted: No Electronic Signature(s) Signed: 11/27/2023 5:23:50 PM By: Wyn Iha Entered By: Wyn Iha on 11/20/2023 14:20:40 -------------------------------------------------------------------------------- Wound Assessment Details Patient Name: Date of Service: [name] ROSALEA SHERWOOD S. 11/20/2023 1:15 PM Medical Record Number: [id number] Patient Account Number: [id number] Date of Birth/Sex: Treating RN: 1961/04/13 (63 y.o. F) Primary Care Kade Rickels: Delbert Clam Other Clinician: Referring Nile Dorning: Treating Sylena Lotter/Extender: Rosan Harlene Delbert Clam Weeks in Treatment: 30 Wound Status Wound Number: 38 Primary Pressure Ulcer Etiology: Wound Location: Right Gluteus Wound Open Wounding Event: Gradually Appeared Status: Date Acquired: 09/15/2023 Comorbid Chronic Obstructive Pulmonary Disease (COPD), Sleep Apnea, Weeks Of Treatment: 0 History: Arrhythmia, Congestive Heart Failure, Coronary Artery Disease, Clustered Wound: Yes Hypertension, Peripheral Arterial Disease, Cirrhosis , Type II Diabetes, Osteoarthritis, Neuropathy Photos Wound Measurements Length: (cm) Width: (cm) Depth: (cm) Clustered Quantity: Area: (cm) Volume: (cm) 1.5 % Reduction in Area: 3.2 % Reduction in Volume: 0.1 Epithelialization: None 3 Tunneling: No 3.77 Undermining: No 0.377 Wound Description Classification: Category/Stage II Exudate Amount: Medium Exudate Type: Serosanguineous Exudate Color: red, brown Foul Odor After Cleansing: No Slough/Fibrino Yes Wound Bed Granulation Amount: Large (67-100%) Exposed Structure Fascia Exposed: No MELDA, MERMELSTEIN ([id number]) [id number] Page 18 of 18 Fat Layer (Subcutaneous Tissue) Exposed: Yes Tendon Exposed: No Muscle Exposed: No Joint Exposed: No Bone Exposed:  No Periwound Skin Texture Texture Color No Abnormalities Noted: No No Abnormalities Noted: No Callus: No Atrophie Blanche: No Crepitus: No Cyanosis: No Excoriation: No Ecchymosis: No Induration: No Erythema: No Rash: No Hemosiderin Staining: No Scarring: No Mottled: No Pallor: No Moisture Rubor: No No Abnormalities Noted: No Dry / Scaly: No Maceration: No Electronic Signature(s) Signed: 11/20/2023 5:26:20 PM By: Cammie Sailors RN, BSN Entered By: Cammie Sailors on 11/20/2023 16:49:46 -------------------------------------------------------------------------------- Vitals Details Patient Name: Date of Service: Karla TOI, Karla [name] 11/20/2023 1:15 PM Medical Record Number: [id number] Patient Account Number: [id number] Date of Birth/Sex: Treating RN: 03-Feb-1961 (63 y.o. F) Primary Care Baker Moronta: Delbert Clam Other Clinician: Referring Andry Bogden: Treating Benjie Ricketson/Extender: Rosan Harlene Delbert Clam Weeks in Treatment: 30 Vital Signs Time Taken: 14:02 Temperature (F): 98 Height (in): 64 Pulse (bpm): 74 Weight (lbs): 225 Respiratory Rate (breaths/min): 18 Body Mass Index (BMI): 38.6 Blood Pressure (mmHg): 125/77 Capillary Blood Glucose (mg/dl): 850 Reference Range: 80 - 120 mg / dl Electronic Signature(s) Signed: 11/27/2023 5:23:50 PM By: Wyn Iha Entered By: Wyn Iha on 11/20/2023 14:04:22

## 2023-11-29 NOTE — Progress Notes (Signed)
 [name] ([id number]) [phone].pdf Page 1 of 14 Visit Report for 11/27/2023 Chief Complaint Document Details Patient Name: Date of Service: [name] 11/27/2023 3:30 PM Medical Record Number: [id number] Patient Account Number: [id number] Date of Birth/Sex: Treating RN: 1960/11/23 (63 y.o. F) Primary Care Provider: Delbert [facility] Other Clinician: Referring Provider: Treating Provider/Extender: [name] [name] Weeks in Treatment: 31 Information Obtained from: Patient Chief Complaint Bilateral LE Ulcers Electronic Signature(s) Signed: 11/27/2023 5:12:23 PM By: Karla Harlene DO Entered By: [name] on 11/27/2023 16:49:08 -------------------------------------------------------------------------------- Debridement Details Patient Name: Date of Service: Karla TOI, DO Karla S. 11/27/2023 3:30 PM Medical Record Number: [id number] Patient Account Number: [id number] Date of Birth/Sex: Treating RN: Mar 26, 1961 (63 y.o. [name] [name] Primary Care Provider: Delbert Price Other Clinician: Referring Provider: Treating Provider/Extender: [name] [name] Weeks in Treatment: 31 Debridement Performed for Assessment: Wound #23 Right Metatarsal head first Performed By: Physician Karla Harlene, DO The following information was scribed by: [name] The information was scribed for: [name] Debridement Type: Debridement Severity of Tissue Pre Debridement: Fat layer exposed Level of Consciousness (Pre-procedure): Awake and Alert Pre-procedure Verification/Time Out Yes - 16:08 Taken: Start Time: 16:08 Pain Control: Lidocaine  4% Topical Solution Percent of Wound Bed Debrided: 100% T Area Debrided (cm): otal 0.13 Tissue and other material debrided: Non-Viable, Callus, Slough, Skin: Dermis , Skin: Epidermis, Slough Level: Skin/Epidermis Debridement Description: Selective/Open Wound Instrument:  Curette Bleeding: Minimum Hemostasis Achieved: Pressure Response to Treatment: Procedure was tolerated well Level of Consciousness (Post- Awake and Alert procedure): Post Debridement Measurements of Total Wound Length: (cm) 0.4 Width: (cm) 0.4 Depth: (cm) 0.4 Volume: (cm) 0.05 Character of Wound/Ulcer Post Debridement: Improved Severity of Tissue Post Debridement: Fat layer exposed [name] ([id number]) [id number] Page 2 of 14 Post Procedure Diagnosis Same as Pre-procedure Electronic Signature(s) Signed: 11/27/2023 5:06:40 PM By: Karla Sailors RN, BSN Signed: 11/27/2023 5:12:23 PM By: Karla Harlene DO Entered By: [name] on 11/27/2023 16:10:13 -------------------------------------------------------------------------------- HPI Details Patient Name: Date of Service: Karla TOI, DO Karla S. 11/27/2023 3:30 PM Medical Record Number: [id number] Patient Account Number: [id number] Date of Birth/Sex: Treating RN: 03/24/1961 (63 y.o. F) Primary Care Provider: Delbert [facility] Other Clinician: Referring Provider: Treating Provider/Extender: [name] [name] Weeks in Treatment: 31 History of Present Illness HPI Description: This 63 year old patient who has a very long significant history of diabetes mellitus, previous alcohol and nicotine  abuse, chronic data disease, COPD, diabetes mellitus, height hypertension, critical lower limb ischemia with several wounds being managed at the wound Center at [name] for over a year. She was recently in the ER at [name] and was referred to our center. He has had a long history of critical limb ischemia and over a period of time has had balloon angioplasties in March 2016, endarterectomy of the left carotid, lower extremity angiogram and treatment by Dr. Dorn [facility], several cardiac catheterizations. Most recently she had an x-ray of her left foot while in the ER which showed no  acute abnormality. during this ER visit she was started on ciprofloxacin  and asked to continue with the wound care physicians at [name]. Her last ABI done in June 2017 showed the right side to be 0.28 in the left side to be 0.48. Her right toe brachial index was 0.17 on the right and 0.27 on the left. her last hemoglobin A1c was 11.1% She continues to smoke about a pack of cigarettes a day. 03/28/2017 -- -- right foot x-ray --  IMPRESSION:Areas of soft tissue swelling. Mild subluxation second PIP joint. No frank dislocation or fracture. No erosive change or bony destruction. No soft tissue ulceration or radiopaque foreign body evident by radiography. There is plantar fascia calcification with a nearby inferior calcaneal spur. There are foci of arterial vascular calcification. 04/04/2017 -- the patient continues to be noncompliant and continues to complain of a lot of pain and has not done anything about quitting smoking Readmission: 06/26/18 on evaluation today patient presents for reevaluation she has not been seen in our [facility] since May 2018. Since she was last seen here in our [facility] she has undergone testing at [name] where it was revealed that she had an ABI of 0.68 with her previous ABI being 0.64 and a left ABI of 0.38 with previous ABI being 0.34 this study which was performed on 04/05/18 was pretty much equivalent to the study performed on 11/22/17. The findings in the end revealed on the right would appear to be moderate right lower extremity arterial disease on the left [name] states moderate in the report but unfortunately this appears to be much more severe at 0.38 compared to the right. Subsequently the patient was scheduled to have angiography with [name] on 04/12/18. This was however canceled due to the fact that the patient was found to have chronic kidney disease stage III and it was to the point that he did not feel that it will be safe to pursue  angiography at that point. She has not been on any antibiotics recently. At this point [name] has not rescheduled anything as far as the injured [name] according to the patient he is somewhat reluctant to do so. Nonetheless with her diminished blood flow this is gonna make it somewhat difficult for her to heal. 07/05/18; this is a patient I have not seen previously. She has very significant PAD as noted above. She apparently has had revascularization efforts by [name] on the right on 3 occasions to the patient. She was supposed to have an attempted angiography on the left however this was canceled apparently because of stage III chronic renal failure. I will need to research all of this. She complains of significant pain in the wound and has claudication enough that when she walks to the end of the driveway she has to stop. She is been using silver  alginate to the wounds on her legs and Iodoflex to the area on her left second toe. 07/13/18 on evaluation today patient's wounds actually appear to be doing about the same. She has an appointment she tells me within the next month that is September 2019 with [name] to discuss options to see if there's anything else that you can recommend or do for her. Nonetheless obviously what we're trying to do is do what we can to save her leg and in turn prevent any additional worsening or damage. None in the meantime we been mainly trying to manage her ulcers as best we can. 07/27/18; some improvement in the multiplicity of wounds on her left lower extremity and foot. She's been using silver  alginate. I was unable to determine that she actually has an appointment with [name]. We are checking into this The patient's wound includes Left lateral foot, left plantar heel, her left anterior calf wound looks close to me, left fourth toe is very close to closed and the left medial calf is perhaps the largest open area READMISSION 02/19/2021 This is a now  63 year old woman  with type 2 diabetes and continued cigarette smoking. She has known PAD. She was last in this clinic in 2019 at that point with wounds on her left foot. She left in a nonhealed state. On 06/28/2019 I see she had a left femoropopliteal by vein and vascular with an amputation of the fifth ray. These managed to heal over. Her last arterial studies were in February 2021. This showed an absent waveform at the posterior tibial artery on the right a dampened monophasic dorsalis pedis on the right of 0.44. On the left again the PTA TA was absent her dorsalis pedis was 0.99 triphasic and her great toe pressure was 0.65. This would have been after her revascularization. Once again she tells me that [name] has done revascularization on the right leg on 3 different occasions. She has a right transmetatarsal amputation apparently done by Dr. Harden remotely but I do not see a note for these right lower extremity revascularization but I may not of looked back far enough. [name], [name] ([id number]) [phone].pdf Page 3 of 14 In any case she says that the she has a wound on the plantar aspect of the right transmetatarsal amputation site there is been there for several months. More recently she fell and had a wound on her right medial lower leg she had 5 sutures placed in the ER and then subsequently she has developed an area on the medial lower leg which was a blister that opened up. Past medical history includes type 2 diabetes, PAD, chronic renal failure, congestive heart failure, right transmetatarsal amputation, left fifth ray amputation, continued tobacco abuse, atrial fibrillation and cirrhosis. We did not attempt an ABI on the right leg today because of pain 02/26/2021; patient we admitted to the clinic last week. She has what looks to be 2 areas on her right medial and right anterior lower leg that look more like venous wounds but she has 1 on the first met head at  the base of her right transmet. She has known severe PAD. She complains of a lot of pain although some of this may be neuropathic. Is difficult to exclude a component of claudication. Use silver  alginate on the wounds on her legs and Iodoflex on the area on the first met head. She has an appointment with [name] on 5/25 although I will text him and discussed the situation. She is have poorly controlled diabetic. She has has stage IIIb chronic renal failure 4/22; patient presents for 1 week follow-up. The 2 areas on her right medial and right anterior lower leg appear well-healing. She has been using silver  alginate to this area without issues. She has a first met head ulcer and it is unclear how long this has been there as it was discovered in the ED earlier this month when she was being evaluated for another issue. Iodoflex has been used at this area. 4/29; patient presents for 1 week follow-up. She has been using silver  alginate to the leg wounds and Iodoflex to the plantar foot wound. She has had this wrapped with Coban and Kerlix. She has no concerns or complaints today. 5/6; this is a difficult wound on the right plantar foot transmit site. We are not making a lot of progress. She had 2 more venous looking wounds on the right medial and right anterior lower leg 1 of which is healed. Her appointment with [name] is on 5/25 5/16; she did not tolerate the offloading shoe we gave her in fact she had  a fall with an abrasion on her right forearm she is now back in the modified small shoe which does not offload her foot properly. Her appoint with [name] is still on 5/25 we have been using Iodoflex. The area on the right anterior lower leg is healed 7/18; patient presents for follow-up however has not been seen in 2 months. She was last seen at the end of May. She had a fall at the end of June and was hospitalized. She has been unable to follow-up since then. For the wound she has only been  keeping it covered with gauze. She is scheduled for an aortogram with lower extremity runoff at the end of July. 7/29; patient presents for 2-week follow-up. She has home health and they have been changing her dressings. She denies any issues and has no complaints today. She states she had a procedure where they opened up one of her vessels in her legs. She denies signs of infection. 8/5; patient presents for follow-up. She was recently in the hospital for bradycardia. She was noted to have cellulitis to the left lower extremity and Unna boot was placed due to blisters and increased swelling. She reports improvement to her left leg since being in the hospital and stability check her right plantar foot wound. She denies signs of infection. 8/23; since I last saw this patient a lot has happened. She still has the area over the right first met head in the setting of previous transmetatarsal amputation. Firstly most importantly she underwent revascularization of her occluded right SFA. She underwent directional atherectomy followed by drug-coated balloon angioplasty. She has no named vessel below the knee hopefully the revascularization of the right SFA will improve collateral flow. It is not felt however that she has any endovascular options. She had follow-up arterial studies noninvasive on 8/9. These showed an ABI of 0.44 at the right PTA. Monophasic waveforms. On the left her great toe ABI was 0.68. Her follow-up arterial Doppler showed a 50 to 74% stenosis in the proximal SFA and a 50 to 74% stenosis in the distal SFA mild to severe atherosclerosis noted throughout the extremity. Areas of shadowing plaque seen, unable to rule out higher grade stenosis she also had a fall apparently wearing a right foot forefoot off loader that we gave her. She therefore comes in in an ordinary running shoe today. Not certain if this is the fall that ended up in the hospital with bradycardia 9/6 area over the right  first metatarsal head in the setting of her previous amputation and severe PAD. She is also a diabetic with known PAD status post attempt at revascularization. She is continued smoker Again the separation of visits in the clinic is somewhat disturbing if we are going to consider her for a total contact cast. She is not wearing anything to offload this stating it causes imbalance especially the forefoot off loader. She basically comes in in bedroom slippers She also had a fall this morning about an hour ago. She has a skin tear on the left dorsal forearm 9/13; areas over the right first metatarsal head in the setting of previous TMA and severe PAD. Again she comes in here in slippers. We have been using Hydrofera Blue. We use MolecuLight on this which was essentially negative study 9/20; right first metatarsal head in the setting of her previous TMA and severe PAD. Same slipper type shoes. She says she cannot wear a forefoot off loader and for some reason she will not  wear surgical shoes. She says she is smoking half a pack per day. 9/30; right first metatarsal head. Absolutely no improvement in fact there is tunneling superiorly very close to bone. She does not offload this properly at all. We use MolecuLight on this 2 weeks ago that did not show any surface bacteria we have been using silver  collagen is a dressing She tells me she is down to 3 cigarettes a day I have offered encouragement and a treatment plan 10/6; right first metatarsal head. Exposed bone this week which is not surprising. We have been using silver  collagen. She is smoking 5 to 10 cigarettes a day 10/17; she has not been here in 10 days. The bone scraping that I did on 10/6 showed staph lugdunensis, staph epidermidis and Pseudomonas Alcalifenes. I put in for Septra  DS 1 p.o. twice daily for 14 days last week would be but we could not reach her to start the antibiotic. Quinolones would have been a good alternative except she has  multiple drug interactions. Septra  would not cover what ever the Pseudomonas is but I am not really sure of the relevance here. I am going to try her on the Septra  for 2 weeks. She has a probing wound on the right TMA that probes to bone. She claims to have just about stop smoking I reviewed her arterial status. She underwent a left fem-tib bypass by Dr. Oris in 06/28/2019. Her last angiogram was in July of this year by [name]. This showed an occluded right SFA the popliteal and tibial vessels were also occluded the peroneal vessel filled by collaterals and filled the PT and DP by communicating collaterals. She was not felt to have any additional and vascular options. [name] noted that she he had previously revascularized her right SFA 3 times. 10/25; she is tolerating the Septra  but says it makes her nauseated. I am going to continue this for an additional 2 weeks perhaps for a total of 6 weeks. Her wound does not look too much different. There is on the right first met metatarsal head. There is probing areas around the tissue that is in the middle of the wound. There is no purulence I cannot feel bone. The original source was for a bone scraping. 11/8; right first metatarsal head. This does not probe to bone and there is no purulence. As far as I can tell she is completed the Septra  although there were problems with exact length of time and I think compliance. In any case I gave her 6 weeks. I have reviewed her PAD above. 11/15; right first metatarsal head. Again protruding subcutaneous tissue but this does not have any adherence to surrounding skin. Undermining circumferentially. There is no palpable bone. Not grossly purulent. We have been using Iodoflex to try to get some adherence to clean off the surface but no real improvement. I do not think they actually had enough supplies listening to the patient. She completed the 6 weeks of Septra  I gave her, but I am not sure about the compliance  with the dates i.e. may have missed days taken 1 a day etc. The patient has a vascular follow-up with [name] this coming Friday i.e. in 3 days. By my read of arterial evaluations I do not think she is felt to be a candidate for any further revascularization. The last angiogram was in July. 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 She continues to smoke now up to 10 cigarettes a day.  She is not in any pain. Wound has not improved but is certainly not worsened. I gave her 6 weeks of trimethoprim /sulfamethoxazole  which she is completed in some fashion. 11/29 right first metatarsal head previous TMA. Wound actually looks somewhat better today still a lot of callus around the wound on debris on the wound surface. UNFORTUNATELY the patient missed her appointment with [name] and is not rebooked apparently until sometime in February [name], [name] ([id number]) [id number] Page 4 of 14 12/13; right first metatarsal wound actually looks some smaller today. I did not debride this today. She is using Iodoflex. When she came in last week she had a large abscess on her left anterior lower leg apparently after falling we send her to the ER to have this drained which was done. I cannot see any cultures x-ray was negative. She was apparently given 2 doses of IV antibiotics and discharged on Bactrim  DS twice daily which she is still taking. As mentioned I cannot see any cultures. 12/29; culture of the abscess on the left leg I did last time showed Serratia. We gave her antibiotics. I note she was in the ER next day with bleeding which they controlled she is on anticoagulants.She comes in today to clinic and the area on her right plantar foot is miraculously very hyperkeratotic but healed 02/06/2023 Ms. Kasen Sako is a 63 year old female with a past medical history of uncontrolled type 2 diabetes with TMA to the right foot and fifth ray amputation to left foot. She  reports a callus to the right first met head and on the lateral aspect of the left foot. She saw podiatry 3 days ago and had the callus debrided. There are no open wounds at this time. She follows up with podiatry in 2 weeks. Readmission: 04-19-2023 upon evaluation today patient appears to be doing well all things considered in regard to her wounds. She does have several wounds however on her feet 1 on the left foot centrally along the lateral edge and then a wound on the right lower leg more posterior. On the right foot plantar distal at the transmetatarsal amputation site this is the worst. With that being said she tells me this has been going on for quite some time she has been seen by podiatry they have done some debridements in the past. With that being said based on what we are seeing I believe that the patient's primary issue on the right side is that she is having some issues here with her arterial flow. The ABI on the right is 0.57 on the left is 1.04. With that being said this reading on the right is getting necessitated further evaluation by vascular. She tells me she has been seen by Dr. Wadie in the past and he stated there was nothing further to be done for the right leg. With that being said I think that it would be good to have a second opinion with vascular on this. Patient does have a history of diabetes mellitus type 2, peripheral vascular disease, and hypertension. The previous ABIs were performed on January 11, 2023. 04-26-2023 upon evaluation today patient appears to be doing about the same in regard to her wounds. She still has not had the arterial study at this point. I think this is something needs to be done as quickly as possible. Fortunately I do not see any evidence of active infection locally or systemically which is great news. No fevers, chills, nausea, vomiting, or diarrhea. 7/24; the patient  has not been here in about a month and a half. She has open wounds on the  plantar right foot second metatarsal head in the setting of a previous TMA, she has a new area on the left lateral foot and perhaps traumatic areas on the right anterior and left lateral lower legs. She has poorly controlled edema. The patient has known severe PAD has had multiple interventions in the right SFA. 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 She had an pressure of 60 at the dorsalis pedis giving her an ABI of 0.57 monophasic waveforms here. On the left her study was a lot better with an ABI 1.04 triphasic waveforms. The ABI might be falsely elevated on the left. I think at her last angiogram she had occluded right SFA popliteal and tibial vessels with the peroneal vessel being the only vessel feeding her foot via collaterals. She comes in this time in basic over-the-counter shoes 06-14-2023 upon evaluation today patient appears to be doing well currently in regard to her wounds all things considered except for the left lateral foot where she has what appears to be a blister this is erythematous as well and concerned about infection. I discussed with her today that I do think we need to see about doing the Vi core culture and subsequently depending on the results of this we will initiate antibiotic treatment as necessary. With that being said right now I think that she is going to need to be aggressive and elevating her legs she has an appointment with vascular coming up around 3 August is what I believe her appointment date is. Nonetheless once we get clearance from the standpoint of vascular she does have good blood flow and we can subsequently try to see what we do about getting her edema under much better control and then she really needs some much stronger compression than what we currently are able to do. 06-21-2023 upon evaluation today patient appears to be doing pretty well  currently in regard to her wounds most of which seem to be showing signs of improvement. She has been require little bit of debridement in regard to the right plantar foot wound I will do this as carefully as possible as we are still working on establishing that she has sufficient blood flow at this point. This right side on previous testing seem to be somewhat questionable. Fortunately I do not see any signs of active infection locally nor systemically at this time. 06-28-2023 patient does have an appointment with vascular on 07-11-2023. With that being said right now she has been having some slow progress but still nonetheless good progress at this point. I am actually very pleased with where we stand. I do not see any signs of active infection locally or systemically which is great news. No fevers, chills, nausea, vomiting, or diarrhea. 07-05-2023 upon evaluation today patient appears to be doing well currently in regard to her wound. She has been tolerating the dressing changes. She only has 1 wound left on the plantar aspect of the right foot. With that being said she does have a peg assist offloading shoe she has been using at this time. 8/28; patient with a wound over the plantar right foot metatarsal head. She was seen by Dr. Magda of vascular surgery. Noted that the right ABI was only 0.57 it was felt that she had progression versus a  previous study. She had some stenosis in the SFA a 7599% stenosis in the proximal mid SFA mild stenosis in the distal SFA and mild stenosis in the proximal PTA. On the left she had a patent left femoral to ATA bypass graft with mild stenosis It was recommended that she undergo angiography for limb salvage on the right. That is being arranged as we speak. Her wound is on the right first metatarsal head she has been using collagen as the primary dressing. She tells me she is making every effort to stop smoking 07-19-2023 upon evaluation today patient appears to be  doing okay in regard to her foot ulcer although her arterial procedure/revascularization keeps getting pushed back. She states that they are talking about pushing back and left nail due to the fact that she changed insurances and I do not have a being updated insurance card number. With that being said I explained to the patient that should be something that she should be able to get on line easily by making an account. She is also called [employer] and they said they were sending it but it would be about a week before described by next Wednesday. She really does not have time to be holding on this she needs to be proceeding with intervention as quickly as possible. 07-26-2023 upon evaluation today patient appears to be doing well currently in regard to her wound. This is actually showing signs of looking about the same I did clear a lot of callus up last week this actually looks better to me from an overall visual standpoint than last week though it is a little larger because of the callus I had removed. With that being said I feel like she is actually making really good progress here towards complete closure. No fevers, chills, nausea, vomiting, or diarrhea. 08-09-2023 upon evaluation today patient presents for follow-up concerning her ongoing issues with her right plantar foot ulcer. She is going to be having arteriogram on Friday that is just just 2 days and hopefully following this it will actually allow for more aggressive healing in regard to the right plantar foot. Today I am going to perform some debridement to clear away some of the necrotic debris. 08-23-2023 upon evaluation patient's wound really appears to be doing about the same. She did have her arteriogram with Dr. Magda and that was on 9-27- 2024. Unfortunately it was noted that the patient had no good options for further revascularization. They plan to reevaluate there in a month but she is stated to be at high risk for major  amputation. With that being said it does appear they were able to do stenting of the right femoral-popliteal artery but unfortunately they were unable to cross the TP trunk and proximal peroneal occlusion. Subsequently this means that there is really not good blood flow into the ankle and foot which means that the patient really does not have a good chance of getting this to heal. 08-30-2023 upon evaluation today patient appears to be doing well currently in regard to her wound. She has been tolerating the dressing changes without [name], [name] ([id number]) [phone].pdf Page 5 of 14 complication and in general I do feel that the patient is doing quite well at this time. Unfortunately with the blood flow being so low and really no real vascular options to improve blood flow she really has to try to stay off of this is much as possible. 09-06-2023 upon evaluation today patient appears to be doing well  currently in regard to the wound on her plantar foot it does not seem to be any worse is not terribly better but again there is really not much we can do from a blood flow standpoint at this time unfortunately. There does not appear to be any signs of active infection locally or systemically which is good news. She does have a wound on the posterior ankle location and the right leg as well. She also has an area on the left lateral foot. 09-13-2023 upon evaluation today patient appears to be doing decently well all things considered in regard to her wounds. Fortunately I do not see any signs of worsening overall I do believe that the patient is making good headway towards closure which is good news. No fevers, chills, nausea, vomiting, or diarrhea. 11/20; since the patient was last here she was admitted to hospital from 11/9 through 09/27/2023. She had atrial fibrillation with rapid ventricular response she underwent successful cardioversion to normal sinus rhythm. She has noted  to have stage III chronic renal failure, heart failure with preserved ejection fraction. She was discharged home apparently with a furosemide  infusion pump although I do not see that on her discharge summary. She is listed as having torsemide  to 100 mg/day. Unfortunately in any case she is gaining weight quite substantially per description of the patient. She has significant bilateral lower extremity edema but nonpitting. She has no open wounds on the left leg and right leg. She was not discharged with any home health and I am not really clear anybody is dressing these wounds. She has known PAD She has the original wounds on the right first met head and right posterior Achilles area. New wounds on the right anteriorly and right posteriorly and on the left laterally and posteriorly. All her wounds are draining clear edema fluid 12/5; since the patient was last here she was admitted to hospital from 11/26 through 11/29. She had acute on chronic diastolic congestive heart failure. She was also in the ER on 12/3 with a small abscess in her right buttock. She was put on doxycycline . She had her legs wrapped when she left the hospital but she took these off 2-3 deep days ago. There is very significant bilateral lower extremity edema which she is tense. She has wounds on her right first metatarsal head right anterior lower extremity right posterior lower extremity x 2 and her left lateral and left posterior lower extremity. The areas on the lower extremity looked mostly like venous wounds although her complicating systemic fluid volume overload is no doubt contributing. We have been using silver  alginate and Kerlix Coban. As mentioned she took these off a few days ago The patient saw Dr. Magda on 10/10/2023. She is status post right femoral-popliteal angioplasty and stenting 12/12; patient with wounds on the right greater than left lower leg. She arrives in clinic today with the edema control much better  even though we just use kerlix Coban. I do not know what dose of diuretic she is on. She has wounds on the right posterior leg right lateral leg and more superficial areas on the left lateral leg. She also has areas on the right first metatarsal head. The patient has significant PAD status post right femoropopliteal angioplasty and stenting. Also chronic venous insufficiency 11/20/2023; patient presents to the clinic for follow-up. She has a wound to the right first met head. She just recently developed a skin tear to the dorsal hand. She has also developed skin breakdown to  the right buttocks over the past week. She has been using Neosporin to the new wound beds. The right Lower extremity has been placed in compression and silver  alginate used to the foot wound. she currently denies signs of infection. She has no issues or complaints today. 11/27/2023; patient presents for follow-up. We have been using silver  alginate under compression therapy to the right lower extremity/foot wound. T the dorsal o hand and gluteus she reports using antibiotic ointment. She has no issues or complaints. She denies signs of infection. Electronic Signature(s) Signed: 11/27/2023 5:12:23 PM By: Karla Raisin DO Entered By: Karla Raisin on 11/27/2023 16:54:30 -------------------------------------------------------------------------------- Physical Exam Details Patient Name: Date of Service: Karla KNIGHTS, DO Karla S. 11/27/2023 3:30 PM Medical Record Number: [id number] Patient Account Number: [id number] Date of Birth/Sex: Treating RN: 01/18/1961 (63 y.o. F) Primary Care Provider: Delbert Price Other Clinician: Referring Provider: Treating Provider/Extender: Karla Raisin [name] Weeks in Treatment: 31 Constitutional respirations regular, non-labored and within target range for patient.. Cardiovascular 2+ dorsalis pedis/posterior tibialis pulses. Psychiatric pleasant and cooperative. Notes T the  plantar aspect of the right foot there is an open wound with granulation tissue and nonviable tissue. Skin tear to the left dorsal hand with granulation o tissue And scabbing. Skin breakdown to the right gluteus. No signs of infection including increased warmth, erythema or purulent drainage from any of the wound beds. Electronic Signature(s) [name], [name] ([id number]) [id number] Page 6 of 14 Signed: 11/27/2023 5:12:23 PM By: Karla Raisin DO Entered By: Karla Raisin on 11/27/2023 16:56:39 -------------------------------------------------------------------------------- Physician Orders Details Patient Name: Date of Service: Karla KNIGHTS, DO Karla S. 11/27/2023 3:30 PM Medical Record Number: [id number] Patient Account Number: [id number] Date of Birth/Sex: Treating RN: 04-16-1961 (63 y.o. [name] [name] Primary Care Provider: Delbert Price Other Clinician: Referring Provider: Treating Provider/Extender: Karla Raisin [name] Weeks in Treatment: 66 Verbal / Phone Orders: No Diagnosis Coding ICD-10 Coding Code Description 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 L97.522 Non-pressure chronic ulcer of other part of left foot with fat layer exposed E11.622 Type 2 diabetes mellitus with other skin ulcer L97.812 Non-pressure chronic ulcer of other part of right lower leg with fat layer exposed I73.89 Other specified peripheral vascular diseases I87.331 Chronic venous hypertension (idiopathic) with ulcer and inflammation of right lower extremity L97.828 Non-pressure chronic ulcer of other part of left lower leg with other specified severity L89.312 Pressure ulcer of right buttock, stage 2 S60.512A Abrasion of left hand, initial encounter Follow-up Appointments ppointment in 2 weeks. - Dr Karla Return A Anesthetic (In clinic) Topical Lidocaine  5% applied  to wound bed Bathing/ Shower/ Hygiene May shower with protection but do not get wound dressing(s) wet. Protect dressing(s) with water  repellant cover (for example, large plastic bag) or a cast cover and may then take shower. Edema Control - Orders / Instructions Elevate legs to the level of the heart or above for 30 minutes daily and/or when sitting for 3-4 times a day throughout the day. Avoid standing for long periods of time. Off-Loading Open toe surgical shoe to: - with Peg Assist in shoe. Limit walking on foot. Stay off the foot to minimize pressure to bottom of foot. Additional Orders / Instructions Stop/Decrease Smoking Follow Nutritious Diet Non Wound Condition Left Lower Extremity pply the following to affected area as directed: - left foot- apply betadine to affected area cover with ABD pad, secure with kerlix tubigrip size E.  A Home Health Wound #23 Right Metatarsal head first New wound care orders this week; continue Home Health for wound care. May utilize formulary equivalent dressing for wound treatment orders unless otherwise specified. - Pt. will come to Wound Clinic every 3weeks-Please perform wound dressing changes 2 x for 2 week (i.e. the week pt. is NOT coming to the Wound Clinic) and the alternate week-Pt. will have an appointment at [facility] Clinic and [facility] change wound dressings x1 that week. Wound dressings- Silver  alginate to foot,cover with gauze or ABD and Kerlix and Coban to Lower Leg, antibiotic ointment to hand and glute Other Home Health Orders/Instructions: - [facility] home [facility] Wound Treatment Wound #23 - Metatarsal head first Wound Laterality: Right Cleanser: Soap and Water  3 x Per Week/30 Days Discharge Instructions: May shower and wash wound with dial  antibacterial soap and water  prior to dressing change. [name], [name] ([id number]) [phone].pdf Page 7 of 14 Cleanser: Wound Cleanser 3 x Per Week/30 Days Discharge  Instructions: Cleanse the wound with wound cleanser prior to applying a clean dressing using gauze sponges, not tissue or cotton balls. Peri-Wound Care: Sween Lotion (Moisturizing lotion) 3 x Per Week/30 Days Discharge Instructions: Apply moisturizing lotion as directed Topical: Gentamicin 3 x Per Week/30 Days Discharge Instructions: As directed by physician Prim Dressing: Maxorb Extra Ag+ Alginate Dressing, 2x2 (in/in) 3 x Per Week/30 Days ary Discharge Instructions: Apply to wound bed as instructed Secondary Dressing: ABD Pad, 8x10 3 x Per Week/30 Days Discharge Instructions: Apply over primary dressing as directed. Secured With: Coban Self-Adherent Wrap 4x5 (in/yd) 3 x Per Week/30 Days Discharge Instructions: Secure with Coban as directed. Secured With: [employer], 4.5x3.1 (in/yd) 3 x Per Week/30 Days Discharge Instructions: Secure with Kerlix as directed. Secured With: 11M Medipore H Soft Cloth Surgical T ape, 4 x 10 (in/yd) 3 x Per Week/30 Days Discharge Instructions: Secure with tape as directed. Wound #37 - Hand - Dorsum Wound Laterality: Left Cleanser: Soap and Water  Discharge Instructions: May shower and wash wound with dial  antibacterial soap and water  prior to dressing change. Topical: Triple Antibiotic Ointment, 1 (oz) Tube Secondary Dressing: Bordered Gauze, 4x4 in Discharge Instructions: Apply over primary dressing as directed. Wound #38 - Gluteus Wound Laterality: Right Cleanser: Soap and Water  Discharge Instructions: May shower and wash wound with dial  antibacterial soap and water  prior to dressing change. Topical: Triple Antibiotic Ointment, 1 (oz) Tube Secondary Dressing: Zetuvit Plus Silicone Border Dressing 5x5 (in/in) Discharge Instructions: Apply silicone border over primary dressing as directed. Patient Medications llergies: gabapentin , Lyrica  A Notifications Medication Indication Start End 11/27/2023 lidocaine  DOSE topical 4 % cream - cream topical  once daily Electronic Signature(s) Signed: 11/27/2023 5:12:23 PM By: Karla Raisin DO Entered By: Karla Raisin on 11/27/2023 16:56:50 -------------------------------------------------------------------------------- Problem List Details Patient Name: Date of Service: Karla KNIGHTS, DO Karla S. 11/27/2023 3:30 PM Medical Record Number: [id number] Patient Account Number: [id number] Date of Birth/Sex: Treating RN: 1961-07-01 (63 y.o. [name] [name] Primary Care Provider: Delbert Price Other Clinician: Referring Provider: Treating Provider/Extender: Karla Raisin [name] Weeks in Treatment: [address] [place] ([id number]) [id number] Page 8 of 14 Active Problems ICD-10 Encounter Code Description Active Date MDM Diagnosis 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 2 diabetes mellitus with foot ulcer 04/19/2023 No Yes L97.512 Non-pressure chronic ulcer of other part of right foot with fat layer exposed 04/19/2023 No Yes L97.522 Non-pressure chronic ulcer of other part of left foot with fat layer exposed 04/19/2023 No Yes E11.622 Type 2 diabetes mellitus with other skin ulcer  04/19/2023 No Yes L97.812 Non-pressure chronic ulcer of other part of right lower leg with fat layer 04/19/2023 No Yes exposed I73.89 Other specified peripheral vascular diseases 04/19/2023 No Yes I87.331 Chronic venous hypertension (idiopathic) with ulcer and inflammation of right 04/19/2023 No Yes lower extremity L97.828 Non-pressure chronic ulcer of other part of left lower leg with other specified 10/04/2023 No Yes severity L89.312 Pressure ulcer of right buttock, stage 2 11/20/2023 No Yes S60.512A Abrasion of left hand, initial encounter 11/20/2023 No Yes Inactive Problems Resolved Problems ICD-10 Code Description Active Date Resolved Date L89.322 Pressure ulcer of left buttock, stage 2 10/26/2023 10/26/2023 Electronic Signature(s) Signed: 11/27/2023 5:12:23 PM By: Karla Raisin DO Entered By: Karla Raisin on  11/27/2023 16:48:50 -------------------------------------------------------------------------------- Progress Note Details Patient Name: Date of Service: Karla KNIGHTS, DO Karla S. 11/27/2023 3:30 PM Medical Record Number: [id number] Patient Account Number: [id number] [name], [name] ([id number]) [phone].pdf Page 9 of 14 Date of Birth/Sex: Treating RN: 09-26-1961 (63 y.o. F) Primary Care Provider: Other Clinician: Delbert Price Referring Provider: Treating Provider/Extender: Karla Raisin [name] Weeks in Treatment: 31 Subjective Chief Complaint Information obtained from Patient Bilateral LE Ulcers History of Present Illness (HPI) This 63 year old patient who has a very long significant history of diabetes mellitus, previous alcohol and nicotine  abuse, chronic data disease, COPD, diabetes mellitus, height hypertension, critical lower limb ischemia with several wounds being managed at the wound Center at [facility] for over a year. She was recently in the ER at [facility] and was referred to our center. He has had a long history of critical limb ischemia and over a period of time has had balloon angioplasties in March 2016, endarterectomy of the left carotid, lower extremity angiogram and treatment by Dr. Dorn Price, several cardiac catheterizations. Most recently she had an x-ray of her left foot while in the ER which showed no acute abnormality. during this ER visit she was started on ciprofloxacin  and asked to continue with the wound care physicians at [facility]. Her last ABI done in June 2017 showed the right side to be 0.28 in the left side to be 0.48. Her right toe brachial index was 0.17 on the right and 0.27 on the left. her last hemoglobin A1c was 11.1% She continues to smoke about a pack of cigarettes a day. 03/28/2017 -- -- right foot x-ray -- IMPRESSION:Areas of soft tissue swelling. Mild subluxation second PIP joint. No frank  dislocation or fracture. No erosive change or bony destruction. No soft tissue ulceration or radiopaque foreign body evident by radiography. There is plantar fascia calcification with a nearby inferior calcaneal spur. There are foci of arterial vascular calcification. 04/04/2017 -- the patient continues to be noncompliant and continues to complain of a lot of pain and has not done anything about quitting smoking Readmission: 06/26/18 on evaluation today patient presents for reevaluation she has not been seen in our [facility] since May 2018. Since she was last seen here in our [facility] she has undergone testing at [name] where it was revealed that she had an ABI of 0.68 with her previous ABI being 0.64 and a left ABI of 0.38 with previous ABI being 0.34 this study which was performed on 04/05/18 was pretty much equivalent to the study performed on 11/22/17. The findings in the end revealed on the right would appear to be moderate right lower extremity arterial disease on the left Dr. Lesches states moderate in the report but unfortunately this appears to be much more severe  at 0.38 compared to the right. Subsequently the patient was scheduled to have angiography with [name] on 04/12/18. This was however canceled due to the fact that the patient was found to have chronic kidney disease stage III and it was to the point that he did not feel that it will be safe to pursue angiography at that point. She has not been on any antibiotics recently. At this point [name] has not rescheduled anything as far as the injured [place] according to the patient he is somewhat reluctant to do so. Nonetheless with her diminished blood flow this is gonna make it somewhat difficult for her to heal. 07/05/18; this is a patient I have not seen previously. She has very significant PAD as noted above. She apparently has had revascularization efforts by [name] on the right on 3 occasions to the patient. She was  supposed to have an attempted angiography on the left however this was canceled apparently because of stage III chronic renal failure. I will need to research all of this. She complains of significant pain in the wound and has claudication enough that when she walks to the end of the driveway she has to stop. She is been using silver  alginate to the wounds on her legs and Iodoflex to the area on her left second toe. 07/13/18 on evaluation today patient's wounds actually appear to be doing about the same. She has an appointment she tells me within the next month that is September 2019 with [name] to discuss options to see if there's anything else that you can recommend or do for her. Nonetheless obviously what we're trying to do is do what we can to save her leg and in turn prevent any additional worsening or damage. None in the meantime we been mainly trying to manage her ulcers as best we can. 07/27/18; some improvement in the multiplicity of wounds on her left lower extremity and foot. She's been using silver  alginate. I was unable to determine that she actually has an appointment with [name]. We are checking into this The patient's wound includes Left lateral foot, left plantar heel, her left anterior calf wound looks close to me, left fourth toe is very close to closed and the left medial calf is perhaps the largest open area READMISSION 02/19/2021 This is a now 63 year old woman with type 2 diabetes and continued cigarette smoking. She has known PAD. She was last in this clinic in 2019 at that point with wounds on her left foot. She left in a nonhealed state. On 06/28/2019 I see she had a left femoropopliteal by vein and vascular with an amputation of the fifth ray. These managed to heal over. Her last arterial studies were in February 2021. This showed an absent waveform at the posterior tibial artery on the right a dampened monophasic dorsalis pedis on the right of 0.44. On the left again  the PTA TA was absent her dorsalis pedis was 0.99 triphasic and her great toe pressure was 0.65. This would have been after her revascularization. Once again she tells me that [name] has done revascularization on the right leg on 3 different occasions. She has a right transmetatarsal amputation apparently done by Dr. Harden remotely but I do not see a note for these right lower extremity revascularization but I may not of looked back far enough. In any case she says that the she has a wound on the plantar aspect of the right transmetatarsal amputation site there is  been there for several months. More recently she fell and had a wound on her right medial lower leg she had 5 sutures placed in the ER and then subsequently she has developed an area on the medial lower leg which was a blister that opened up. Past medical history includes type 2 diabetes, PAD, chronic renal failure, congestive heart failure, right transmetatarsal amputation, left fifth ray amputation, continued tobacco abuse, atrial fibrillation and cirrhosis. We did not attempt an ABI on the right leg today because of pain 02/26/2021; patient we admitted to the clinic last week. She has what looks to be 2 areas on her right medial and right anterior lower leg that look more like venous wounds but she has 1 on the first met head at the base of her right transmet. She has known severe PAD. She complains of a lot of pain although some of this may be neuropathic. Is difficult to exclude a component of claudication. Use silver  alginate on the wounds on her legs and Iodoflex on the area on the first met head. She has an appointment with [name] on 5/25 although I will text him and discussed the situation. She is have poorly controlled diabetic. She has has stage IIIb chronic renal failure 4/22; patient presents for 1 week follow-up. The 2 areas on her right medial and right anterior lower leg appear well-healing. She has been using silver   alginate to this area without issues. She has a first met head ulcer and it is unclear how long this has been there as it was discovered in the ED earlier this month when she was being evaluated for another issue. Iodoflex has been used at this area. 4/29; patient presents for 1 week follow-up. She has been using silver  alginate to the leg wounds and Iodoflex to the plantar foot wound. She has had this TATTIANNA, SCHNARR ([id number]) [id number] Page 10 of 14 wrapped with Coban and Kerlix. She has no concerns or complaints today. 5/6; this is a difficult wound on the right plantar foot transmit site. We are not making a lot of progress. She had 2 more venous looking wounds on the right medial and right anterior lower leg 1 of which is healed. Her appointment with [name] is on 5/25 5/16; she did not tolerate the offloading shoe we gave her in fact she had a fall with an abrasion on her right forearm she is now back in the modified small shoe which does not offload her foot properly. Her appoint with [name] is still on 5/25 we have been using Iodoflex. The area on the right anterior lower leg is healed 7/18; patient presents for follow-up however has not been seen in 2 months. She was last seen at the end of May. She had a fall at the end of June and was hospitalized. She has been unable to follow-up since then. For the wound she has only been keeping it covered with gauze. She is scheduled for an aortogram with lower extremity runoff at the end of July. 7/29; patient presents for 2-week follow-up. She has home health and they have been changing her dressings. She denies any issues and has no complaints today. She states she had a procedure where they opened up one of her vessels in her legs. She denies signs of infection. 8/5; patient presents for follow-up. She was recently in the hospital for bradycardia. She was noted to have cellulitis to the left lower extremity and  Unna boot was placed  due to blisters and increased swelling. She reports improvement to her left leg since being in the hospital and stability check her right plantar foot wound. She denies signs of infection. 8/23; since I last saw this patient a lot has happened. She still has the area over the right first met head in the setting of previous transmetatarsal amputation. Firstly most importantly she underwent revascularization of her occluded right SFA. She underwent directional atherectomy followed by drug-coated balloon angioplasty. She has no named vessel below the knee hopefully the revascularization of the right SFA will improve collateral flow. It is not felt however that she has any endovascular options. She had follow-up arterial studies noninvasive on 8/9. These showed an ABI of 0.44 at the right PTA. Monophasic waveforms. On the left her great toe ABI was 0.68. Her follow-up arterial Doppler showed a 50 to 74% stenosis in the proximal SFA and a 50 to 74% stenosis in the distal SFA mild to severe atherosclerosis noted throughout the extremity. Areas of shadowing plaque seen, unable to rule out higher grade stenosis she also had a fall apparently wearing a right foot forefoot off loader that we gave her. She therefore comes in in an ordinary running shoe today. Not certain if this is the fall that ended up in the hospital with bradycardia 9/6 area over the right first metatarsal head in the setting of her previous amputation and severe PAD. She is also a diabetic with known PAD status post attempt at revascularization. She is continued smoker Again the separation of visits in the clinic is somewhat disturbing if we are going to consider her for a total contact cast. She is not wearing anything to offload this stating it causes imbalance especially the forefoot off loader. She basically comes in in bedroom slippers She also had a fall this morning about an hour ago. She has a skin tear on the  left dorsal forearm 9/13; areas over the right first metatarsal head in the setting of previous TMA and severe PAD. Again she comes in here in slippers. We have been using Hydrofera Blue. We use MolecuLight on this which was essentially negative study 9/20; right first metatarsal head in the setting of her previous TMA and severe PAD. Same slipper type shoes. She says she cannot wear a forefoot off loader and for some reason she will not wear surgical shoes. She says she is smoking half a pack per day. 9/30; right first metatarsal head. Absolutely no improvement in fact there is tunneling superiorly very close to bone. She does not offload this properly at all. We use MolecuLight on this 2 weeks ago that did not show any surface bacteria we have been using silver  collagen is a dressing She tells me she is down to 3 cigarettes a day I have offered encouragement and a treatment plan 10/6; right first metatarsal head. Exposed bone this week which is not surprising. We have been using silver  collagen. She is smoking 5 to 10 cigarettes a day 10/17; she has not been here in 10 days. The bone scraping that I did on 10/6 showed staph lugdunensis, staph epidermidis and Pseudomonas Alcalifenes. I put in for Septra  DS 1 p.o. twice daily for 14 days last week would be but we could not reach her to start the antibiotic. Quinolones would have been a good alternative except she has multiple drug interactions. Septra  would not cover what ever the Pseudomonas is but I am not really sure of the relevance here. I am  going to try her on the Septra  for 2 weeks. She has a probing wound on the right TMA that probes to bone. She claims to have just about stop smoking I reviewed her arterial status. She underwent a left fem-tib bypass by Dr. Oris in 06/28/2019. Her last angiogram was in July of this year by [name]. This showed an occluded right SFA the popliteal and tibial vessels were also occluded the peroneal vessel  filled by collaterals and filled the PT and DP by communicating collaterals. She was not felt to have any additional and vascular options. [name] noted that she he had previously revascularized her right SFA 3 times. 10/25; she is tolerating the Septra  but says it makes her nauseated. I am going to continue this for an additional 2 weeks perhaps for a total of 6 weeks. Her wound does not look too much different. There is on the right first met metatarsal head. There is probing areas around the tissue that is in the middle of the wound. There is no purulence I cannot feel bone. The original source was for a bone scraping. 11/8; right first metatarsal head. This does not probe to bone and there is no purulence. As far as I can tell she is completed the Septra  although there were problems with exact length of time and I think compliance. In any case I gave her 6 weeks. I have reviewed her PAD above. 11/15; right first metatarsal head. Again protruding subcutaneous tissue but this does not have any adherence to surrounding skin. Undermining circumferentially. There is no palpable bone. Not grossly purulent. We have been using Iodoflex to try to get some adherence to clean off the surface but no real improvement. I do not think they actually had enough supplies listening to the patient. She completed the 6 weeks of Septra  I gave her, but I am not sure about the compliance with the dates i.e. may have missed days taken 1 a day etc. The patient has a vascular follow-up with [name] this coming Friday i.e. in 3 days. By my read of arterial evaluations I do not think she is felt to be a candidate for any further revascularization. The last angiogram was in July. 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 She continues to smoke now up to 10 cigarettes a day. She is not in any pain. Wound has not improved but is certainly not worsened. I gave her 6 weeks of trimethoprim /sulfamethoxazole   which she is completed in some fashion. 11/29 right first metatarsal head previous TMA. Wound actually looks somewhat better today still a lot of callus around the wound on debris on the wound surface. UNFORTUNATELY the patient missed her appointment with [name] and is not rebooked apparently until sometime in February 12/13; right first metatarsal wound actually looks some smaller today. I did not debride this today. She is using Iodoflex.  When she came in last week she had a large abscess on her left anterior lower leg apparently after falling we send her to the ER to have this drained which was done. I cannot see any cultures x-ray was negative. She was apparently given 2 doses of IV antibiotics and discharged on Bactrim  DS twice daily which she is still taking. As mentioned I cannot see any cultures. 12/29; culture of the abscess on the left leg I did last time showed Serratia. We gave her antibiotics. I note she was in the ER next day with bleeding which they  controlled she is on anticoagulants.She comes in today to clinic and the area on her right plantar foot is miraculously very hyperkeratotic but healed 02/06/2023 Ms. 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 She reports a callus to the right first met head and on the lateral aspect of the left foot. She saw podiatry 3 days ago and had the callus debrided. There are no open wounds at this time. She follows up with podiatry in 2 weeks. Readmission: 04-19-2023 upon evaluation today patient appears to be doing well all things considered in regard to her wounds. She does have several wounds however on her feet 1 on the left foot centrally along the lateral edge and then a wound on the right lower leg more posterior. On the right foot plantar distal at the transmetatarsal amputation site this is the worst. With that being said  she tells me this has been going on for quite some time she has been seen by podiatry they have done some debridements in the past. With that being said based on what we are seeing I believe that the patient's primary issue on the right side is that she is having some issues here with her arterial flow. The ABI on the right is 0.57 on the left is 1.04. With that being said this reading on the right is getting [name], [name] ([id number]) [id number] Page 11 of 14 necessitated further evaluation by vascular. She tells me she has been seen by Dr. Wadie in the past and he stated there was nothing further to be done for the right leg. With that being said I think that it would be good to have a second opinion with vascular on this. Patient does have a history of diabetes mellitus type 2, peripheral vascular disease, and hypertension. The previous ABIs were performed on January 11, 2023. 04-26-2023 upon evaluation today patient appears to be doing about the same in regard to her wounds. She still has not had the arterial study at this point. I think this is something needs to be done as quickly as possible. Fortunately I do not see any evidence of active infection locally or systemically which is great news. No fevers, chills, nausea, vomiting, or diarrhea. 7/24; the patient has not been here in about a month and a half. She has open wounds on the plantar right foot second metatarsal head in the setting of a previous TMA, she has a new area on the left lateral foot and perhaps traumatic areas on the right anterior and left lateral lower legs. She has poorly controlled edema. The patient has known severe PAD has had multiple interventions in the right SFA. 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 She had an pressure of 60 at the dorsalis pedis giving her an ABI of  0.57 monophasic waveforms here. On the left her study was a lot better with an ABI 1.04 triphasic waveforms. The ABI might be falsely elevated on the left. I think at her last angiogram she had occluded right SFA popliteal and tibial vessels with the peroneal vessel being the only vessel feeding her foot via collaterals. She comes in this time in basic over-the-counter shoes 06-14-2023 upon evaluation today patient appears to be doing well currently in regard to her  wounds all things considered except for the left lateral foot where she has what appears to be a blister this is erythematous as well and concerned about infection. I discussed with her today that I do think we need to see about doing the Vi core culture and subsequently depending on the results of this we will initiate antibiotic treatment as necessary. With that being said right now I think that she is going to need to be aggressive and elevating her legs she has an appointment with vascular coming up around 3 August is what I believe her appointment date is. Nonetheless once we get clearance from the standpoint of vascular she does have good blood flow and we can subsequently try to see what we do about getting her edema under much better control and then she really needs some much stronger compression than what we currently are able to do. 06-21-2023 upon evaluation today patient appears to be doing pretty well currently in regard to her wounds most of which seem to be showing signs of improvement. She has been require little bit of debridement in regard to the right plantar foot wound I will do this as carefully as possible as we are still working on establishing that she has sufficient blood flow at this point. This right side on previous testing seem to be somewhat questionable. Fortunately I do not see any signs of active infection locally nor systemically at this time. 06-28-2023 patient does have an appointment with vascular on  07-11-2023. With that being said right now she has been having some slow progress but still nonetheless good progress at this point. I am actually very pleased with where we stand. I do not see any signs of active infection locally or systemically which is great news. No fevers, chills, nausea, vomiting, or diarrhea. 07-05-2023 upon evaluation today patient appears to be doing well currently in regard to her wound. She has been tolerating the dressing changes. She only has 1 wound left on the plantar aspect of the right foot. With that being said she does have a peg assist offloading shoe she has been using at this time. 8/28; patient with a wound over the plantar right foot metatarsal head. She was seen by Dr. Magda of vascular surgery. Noted that the right ABI was only 0.57 it was felt that she had progression versus a previous study. She had some stenosis in the SFA a 7599% stenosis in the proximal mid SFA mild stenosis in the distal SFA and mild stenosis in the proximal PTA. On the left she had a patent left femoral to ATA bypass graft with mild stenosis It was recommended that she undergo angiography for limb salvage on the right. That is being arranged as we speak. Her wound is on the right first metatarsal head she has been using collagen as the primary dressing. She tells me she is making every effort to stop smoking 07-19-2023 upon evaluation today patient appears to be doing okay in regard to her foot ulcer although her arterial procedure/revascularization keeps getting pushed back. She states that they are talking about pushing back and left nail due to the fact that she changed insurances and I do not have a being updated insurance card number. With that being said I explained to the patient that should be something that she should be able to get on line easily by making an account. She is also called [employer] and they said they were sending it but it would be about  a week before  described by next Wednesday. She really does not have time to be holding on this she needs to be proceeding with intervention as quickly as possible. 07-26-2023 upon evaluation today patient appears to be doing well currently in regard to her wound. This is actually showing signs of looking about the same I did clear a lot of callus up last week this actually looks better to me from an overall visual standpoint than last week though it is a little larger because of the callus I had removed. With that being said I feel like she is actually making really good progress here towards complete closure. No fevers, chills, nausea, vomiting, or diarrhea. 08-09-2023 upon evaluation today patient presents for follow-up concerning her ongoing issues with her right plantar foot ulcer. She is going to be having arteriogram on Friday that is just just 2 days and hopefully following this it will actually allow for more aggressive healing in regard to the right plantar foot. Today I am going to perform some debridement to clear away some of the necrotic debris. 08-23-2023 upon evaluation patient's wound really appears to be doing about the same. She did have her arteriogram with Dr. Magda and that was on 9-27- 2024. Unfortunately it was noted that the patient had no good options for further revascularization. They plan to reevaluate there in a month but she is stated to be at high risk for major amputation. With that being said it does appear they were able to do stenting of the right femoral-popliteal artery but unfortunately they were unable to cross the TP trunk and proximal peroneal occlusion. Subsequently this means that there is really not good blood flow into the ankle and foot which means that the patient really does not have a good chance of getting this to heal. 08-30-2023 upon evaluation today patient appears to be doing well currently in regard to her wound. She has been tolerating the dressing changes  without complication and in general I do feel that the patient is doing quite well at this time. Unfortunately with the blood flow being so low and really no real vascular options to improve blood flow she really has to try to stay off of this is much as possible. 09-06-2023 upon evaluation today patient appears to be doing well currently in regard to the wound on her plantar foot it does not seem to be any worse is not terribly better but again there is really not much we can do from a blood flow standpoint at this time unfortunately. There does not appear to be any signs of active infection locally or systemically which is good news. She does have a wound on the posterior ankle location and the right leg as well. She also has an area on the left lateral foot. 09-13-2023 upon evaluation today patient appears to be doing decently well all things considered in regard to her wounds. Fortunately I do not see any signs of worsening overall I do believe that the patient is making good headway towards closure which is good news. No fevers, chills, nausea, vomiting, or diarrhea. 11/20; since the patient was last here she was admitted to hospital from 11/9 through 09/27/2023. She had atrial fibrillation with rapid ventricular response she underwent successful cardioversion to normal sinus rhythm. She has noted to have stage III chronic renal failure, heart failure with preserved ejection fraction. She was discharged home apparently with a furosemide  infusion pump although I do not see that on her discharge  summary. She is listed as having torsemide  to 100 mg/day. Unfortunately in any case she is gaining weight quite substantially per description of the patient. She has significant bilateral lower extremity edema but nonpitting. She has no open wounds on the left leg and right leg. She was not discharged with any home health and I am not really clear anybody is dressing these wounds. She has known PAD She has  the original wounds on the right first met head and right posterior Achilles area. New wounds on the right anteriorly and right posteriorly and on the left laterally and posteriorly. All her wounds are draining clear edema fluid [name], [name] ([id number]) [id number] Page 12 of 14 12/5; since the patient was last here she was admitted to hospital from 11/26 through 11/29. She had acute on chronic diastolic congestive heart failure. She was also in the ER on 12/3 with a small abscess in her right buttock. She was put on doxycycline . She had her legs wrapped when she left the hospital but she took these off 2-3 deep days ago. There is very significant bilateral lower extremity edema which she is tense. She has wounds on her right first metatarsal head right anterior lower extremity right posterior lower extremity x 2 and her left lateral and left posterior lower extremity. The areas on the lower extremity looked mostly like venous wounds although her complicating systemic fluid volume overload is no doubt contributing. We have been using silver  alginate and Kerlix Coban. As mentioned she took these off a few days ago The patient saw Dr. Magda on 10/10/2023. She is status post right femoral-popliteal angioplasty and stenting 12/12; patient with wounds on the right greater than left lower leg. She arrives in clinic today with the edema control much better even though we just use kerlix Coban. I do not know what dose of diuretic she is on. She has wounds on the right posterior leg right lateral leg and more superficial areas on the left lateral leg. She also has areas on the right first metatarsal head. The patient has significant PAD status post right femoropopliteal angioplasty and stenting. Also chronic venous insufficiency 11/20/2023; patient presents to the clinic for follow-up. She has a wound to the right first met head. She just recently developed a skin tear to the dorsal  hand. She has also developed skin breakdown to the right buttocks over the past week. She has been using Neosporin to the new wound beds. The right Lower extremity has been placed in compression and silver  alginate used to the foot wound. she currently denies signs of infection. She has no issues or complaints today. 11/27/2023; patient presents for follow-up. We have been using silver  alginate under compression therapy to the right lower extremity/foot wound. T the dorsal o hand and gluteus she reports using antibiotic ointment. She has no issues or complaints. She denies signs of infection. Objective Constitutional respirations regular, non-labored and within target range for patient.. Vitals Time Taken: 3:42 PM, Height: 64 in, Weight: 225 lbs, BMI: 38.6, Temperature: 98.0 F, Pulse: 82 bpm, Respiratory Rate: 18 breaths/min, Blood Pressure: 120/76 mmHg. Cardiovascular 2+ dorsalis pedis/posterior tibialis pulses. Psychiatric pleasant and cooperative. General Notes: T the plantar aspect of the right foot there is an open wound with granulation tissue and nonviable tissue. Skin tear to the left dorsal hand with o granulation tissue And scabbing. Skin breakdown to the right gluteus. No signs of infection including increased warmth, erythema or purulent drainage from any of the wound beds.  Integumentary (Hair, Skin) Wound #23 status is Open. Original cause of wound was Gradually Appeared. The date acquired was: 04/18/2021. The wound has been in treatment 31 weeks. The wound is located on the Right Metatarsal head first. The wound measures 0.4cm length x 0.4cm width x 0.4cm depth; 0.126cm^2 area and 0.05cm^3 volume. There is Fat Layer (Subcutaneous Tissue) exposed. There is no tunneling or undermining noted. There is a medium amount of serosanguineous drainage noted. The wound margin is distinct with the outline attached to the wound base. There is large (67-100%) red granulation within the wound  bed. There is a small (1-33%) amount of necrotic tissue within the wound bed including Eschar and Adherent Slough. The periwound skin appearance had no abnormalities noted for color. The periwound skin appearance exhibited: Callus. The periwound skin appearance did not exhibit: Dry/Scaly, Maceration. Periwound temperature was noted as No Abnormality. Wound #37 status is Open. Original cause of wound was Skin T ear/Laceration. The date acquired was: 11/19/2023. The wound has been in treatment 1 weeks. The wound is located on the Left Hand - Dorsum. The wound measures 1cm length x 1.5cm width x 0.1cm depth; 1.178cm^2 area and 0.118cm^3 volume. There is Fat Layer (Subcutaneous Tissue) exposed. There is no tunneling or undermining noted. There is a medium amount of serosanguineous drainage noted. There is no granulation within the wound bed. There is a large (67-100%) amount of necrotic tissue within the wound bed including Eschar and Adherent Slough. Wound #38 status is Open. Original cause of wound was Gradually Appeared. The date acquired was: 09/15/2023. The wound has been in treatment 1 weeks. The wound is located on the Right Gluteus. The wound measures 0.6cm length x 1cm width x 0.1cm depth; 0.471cm^2 area and 0.047cm^3 volume. There is Fat Layer (Subcutaneous Tissue) exposed. There is a medium amount of serosanguineous drainage noted. There is large (67-100%) granulation within the wound bed. The periwound skin appearance did not exhibit: Callus, Crepitus, Excoriation, Induration, Rash, Scarring, Dry/Scaly, Maceration, Atrophie Blanche, Cyanosis, Ecchymosis, Hemosiderin Staining, Mottled, Pallor, Rubor, Erythema. 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 I debrided nonviable tissue here and recommended continuing silver  alginate under compression therapy. The right dorsal hand skin tear and right gluteus wounds appear stable. Not sure if she is actually using any ointment here. I recommended continuing with antibiotic ointment here. We gave her some in [place] that should last her 2 weeks. Patient has home health. Procedures Wound #23 Pre-procedure diagnosis of Wound #23 is a Diabetic Wound/Ulcer of the Lower Extremity located on the Right Metatarsal head first .Severity of Tissue Pre Debridement is: Fat layer exposed. There was a Selective/Open Wound Skin/Epidermis Debridement with a total area of 0.13 sq cm performed by Karla Raisin, DO. With the following instrument(s): Curette to remove Non-Viable tissue/material. Material removed includes Callus,  Slough, Skin: Dermis, and Skin: Epidermis after achieving pain control using Lidocaine  4% T opical Solution. No specimens were taken. A time out was conducted at 16:08, prior to the start of the procedure. A Minimum amount of bleeding was controlled with Pressure. The procedure was tolerated well. Post Debridement Measurements: 0.4cm length x 0.4cm width x 0.4cm depth; 0.05cm^3 volume. Character of Wound/Ulcer Post Debridement is improved. Severity of Tissue Post Debridement is: Fat layer exposed. Post procedure Diagnosis Wound #23: Same as  Pre-Procedure Plan Follow-up Appointments: Return Appointment in 2 weeks. - Dr Karla Anesthetic: (In clinic) Topical Lidocaine  5% applied to wound bed Bathing/ Shower/ Hygiene: May shower with protection but do not get wound dressing(s) wet. Protect dressing(s) with water  repellant cover (for example, large plastic bag) or a cast cover and may then take shower. Edema Control - Orders / Instructions: Elevate legs to the level of the heart or above for 30 minutes daily and/or when sitting for 3-4 times a day throughout the day. Avoid standing for long periods of time. Off-Loading: Open toe surgical shoe to: - with Peg Assist in shoe. Limit walking on foot. Stay off the foot to minimize pressure to bottom of foot. Additional Orders / Instructions: Stop/Decrease Smoking Follow Nutritious Diet Non Wound Condition: Apply the following to affected area as directed: - left foot- apply betadine to affected area cover with ABD pad, secure with kerlix tubigrip size E. Home Health: Wound #23 Right Metatarsal head first: New wound care orders this week; continue Home Health for wound care. May utilize formulary equivalent dressing for wound treatment orders unless otherwise specified. - Pt. will come to Wound Clinic every 3weeks-Please perform wound dressing changes 2 x for 2 week (i.e. the week pt. is NOT coming to the Wound Clinic) and the alternate week-[facility]. will [facility] change wound dressings x1 that week. Wound dressings- Silver  alginate to foot,cover with gauze or ABD and Kerlix and Coban to Lower Leg, antibiotic ointment to hand and glute Other Home Health Orders/Instructions: - [facility] home [facility] The following medication(s) was prescribed: lidocaine  topical 4 % cream cream topical once daily was prescribed at facility WOUND #23: - Metatarsal head first Wound Laterality: Right Cleanser: Soap and Water  3 x Per Week/30 Days Discharge Instructions: May  shower and wash wound with dial  antibacterial soap and water  prior to dressing change. Cleanser: Wound Cleanser 3 x Per Week/30 Days Discharge Instructions: Cleanse the wound with wound cleanser prior to applying a clean dressing using gauze sponges, not tissue or cotton balls. Peri-Wound Care: Sween Lotion (Moisturizing lotion) 3 x Per Week/30 Days Discharge Instructions: Apply moisturizing lotion as directed Topical: Gentamicin 3 x Per Week/30 Days Discharge Instructions: As directed by physician Prim Dressing: Maxorb Extra Ag+ Alginate Dressing, 2x2 (in/in) 3 x Per Week/30 Days ary Discharge Instructions: Apply to wound bed as instructed Secondary Dressing: ABD Pad, 8x10 3 x Per Week/30 Days Discharge Instructions: Apply over primary dressing as directed. Secured With: Coban Self-Adherent Wrap 4x5 (in/yd) 3 x Per Week/30 Days Discharge Instructions: Secure with Coban as directed. Secured With: [employer], 4.5x3.1 (in/yd) 3 x Per Week/30 Days Discharge Instructions: Secure with Kerlix as directed. Secured With: 62M Medipore H Soft Cloth Surgical T ape, 4 x 10 (in/yd) 3 x Per Week/30 Days Discharge Instructions: Secure with tape as directed. WOUND #37: - Hand - Dorsum Wound Laterality: Left Cleanser: Soap and Water  Discharge Instructions: May shower and wash wound with dial  antibacterial soap  and water  prior to dressing change. Topical: Triple Antibiotic Ointment, 1 (oz) Tube Secondary Dressing: Bordered Gauze, 4x4 in Discharge Instructions: Apply over primary dressing as directed. WOUND #38: - Gluteus Wound Laterality: Right Cleanser: Soap and Water  Discharge Instructions: May shower and wash wound with dial  antibacterial soap and water  prior to dressing change. Topical: Triple Antibiotic Ointment, 1 (oz) Tube Secondary Dressing: Zetuvit Plus Silicone Border Dressing 5x5 (in/in) Discharge Instructions: Apply silicone border over primary dressing as directed. [name], [name]  ([id number]) [phone].pdf Page 14 of 14 1. In [facility] sharp debridement 2. Antibiotic ointment 3. Silver  alginate under compression therapy to the right lower extremity 4. Follow-up in 2 weeks Electronic Signature(s) Signed: 11/27/2023 5:12:23 PM By: Karla Raisin DO Entered By: Karla Raisin on 11/27/2023 17:02:38 -------------------------------------------------------------------------------- SuperBill Details Patient Name: Date of Service: Karla KNIGHTS, DO Karla S. 11/27/2023 Medical Record Number: [id number] Patient Account Number: [id number] Date of Birth/Sex: Treating RN: Aug 06, 1961 (63 y.o. F) Primary Care Provider: Delbert [facility] Other Clinician: Referring Provider: Treating Provider/Extender: Karla Raisin [name] Weeks in Treatment: 31 Diagnosis Coding ICD-10 Codes Code Description 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 L97.522 Non-pressure chronic ulcer of other part of left foot with fat layer exposed E11.622 Type 2 diabetes mellitus with other skin ulcer L97.812 Non-pressure chronic ulcer of other part of right lower leg with fat layer exposed I73.89 Other specified peripheral vascular diseases I87.331 Chronic venous hypertension (idiopathic) with ulcer and inflammation of right lower extremity L97.828 Non-pressure chronic ulcer of other part of left lower leg with other specified severity L89.312 Pressure ulcer of right buttock, stage 2 S60.512A Abrasion of left hand, initial encounter Facility Procedures : CPT4 Code: [id number] Description: 97597 - DEBRIDE WOUND 1ST 20 SQ CM OR < ICD-10 Diagnosis Description 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 Modifier: Quantity: 1 Physician Procedures : CPT4 Code Description Modifier [id number] - WC PHYS  LEVEL 3 - EST PT 25 ICD-10 Diagnosis Description S60.512A Abrasion of left hand, initial encounter L89.312 Pressure ulcer of right buttock, stage 2 Quantity: 1 : [id number] - WC PHYS DEBR WO ANESTH 20 SQ CM ICD-10 Diagnosis Description E11.621 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 Quantity: 1 Electronic Signature(s) Signed: 11/27/2023 5:12:23 PM By: Karla Raisin DO Entered By: Karla Raisin on 11/27/2023 17:03:19

## 2023-11-29 NOTE — Progress Notes (Signed)
 BEE, MARCHIANO ([id number]) [phone].pdf Page 1 of 11 Visit Report for 11/27/2023 Arrival Information Details Patient Name: Date of Service: [name] 11/27/2023 3:30 PM Medical Record Number: [id number] Patient Account Number: [id number] Date of Birth/Sex: Treating RN: [birth year] (63 y.o. [name] Primary Care Jacari Kirsten: Delbert Clam Other Clinician: Referring Braxon Suder: Treating Nykia Turko/Extender: Rosan Harlene Delbert Clam Weeks in Treatment: 31 Visit Information History Since Last Visit Added or deleted any medications: No Patient Arrived: Wheel Chair Any new allergies or adverse reactions: No Arrival Time: 15:42 Had a fall or experienced change in No Accompanied By: self activities of daily living that may affect Transfer Assistance: None risk of falls: Patient Identification Verified: Yes Signs or symptoms of abuse/neglect since last visito No Secondary Verification Process Completed: Yes Hospitalized since last visit: No Patient Requires Transmission-Based Precautions: No Implantable device outside of the clinic excluding No Patient Has Alerts: Yes cellular tissue based products placed in the center Patient Alerts: ABI R 0.57 (01/11/23) since last visit: ABI L 1.04 (01/11/23) Has Dressing in Place as Prescribed: Yes Has Compression in Place as Prescribed: Yes Pain Present Now: No Electronic Signature(Price) Signed: 11/27/2023 5:06:40 PM By: Cammie Sailors RN, BSN Entered By: Cammie Sailors on 11/27/2023 15:42:36 -------------------------------------------------------------------------------- Encounter Discharge Information Details Patient Name: Date of Service: Karla TOI, [name] 11/27/2023 3:30 PM Medical Record Number: [id number] Patient Account Number: [id number] Date of Birth/Sex: Treating RN: 12-12-1960 (63 y.o. [name] Primary Care Mathilda Maguire: Delbert Clam Other Clinician: Referring Lovette Merta: Treating  Habiba Treloar/Extender: Rosan Harlene Delbert Clam Weeks in Treatment: 92 Encounter Discharge Information Items Post Procedure Vitals Discharge Condition: Stable Temperature (F): 98.0 Ambulatory Status: Wheelchair Pulse (bpm): 82 Discharge Destination: Home Respiratory Rate (breaths/min): 18 Transportation: Private Auto Blood Pressure (mmHg): 120/76 Accompanied By: self Schedule Follow-up Appointment: Yes Clinical Summary of Care: Patient Declined Electronic Signature(Price) Signed: 11/27/2023 5:06:40 PM By: Cammie Sailors RN, BSN Entered By: Cammie Sailors on 11/27/2023 16:33:46 Karla DARALYN Price ([id number]) [id number] Page 2 of 11 -------------------------------------------------------------------------------- Lower Extremity Assessment Details Patient Name: Date of Service: [name] 11/27/2023 3:30 PM Medical Record Number: [id number] Patient Account Number: [id number] Date of Birth/Sex: Treating RN: 09-27-1961 (63 y.o. [name] Primary Care Zo Loudon: Delbert Clam Other Clinician: Referring Floretta Petro: Treating Ridhima Golberg/Extender: Rosan Harlene Delbert Clam Weeks in Treatment: 31 Edema Assessment Assessed: [Left: No] [Right: No] Edema: [Left: Yes] [Right: Yes] Calf Left: Right: Point of Measurement: 30 cm From Medial Instep 38.8 cm 37 cm Ankle Left: Right: Point of Measurement: 9 cm From Medial Instep 20 cm 19.2 cm Vascular Assessment Pulses: Dorsalis Pedis Palpable: [Right:Yes] Extremity colors, hair growth, and conditions: Extremity Color: [Left:Hyperpigmented] [Right:Hyperpigmented] Hair Growth on Extremity: [Left:No] [Right:No] Temperature of Extremity: [Left:Warm] [Right:Warm] Capillary Refill: [Left:< 3 seconds] [Right:< 3 seconds] Dependent Rubor: [Left:No Yes] [Right:No Yes] Electronic Signature(Price) Signed: 11/27/2023 5:06:40 PM By: Cammie Sailors RN, BSN Entered By: Cammie Sailors on 11/27/2023  15:47:09 -------------------------------------------------------------------------------- Multi Wound Chart Details Patient Name: Date of Service: Karla TOI, [name] 11/27/2023 3:30 PM Medical Record Number: [id number] Patient Account Number: [id number] Date of Birth/Sex: Treating RN: 1961-11-12 (63 y.o. 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 Primary Etiology: Extremity Chronic Obstructive Pulmonary Chronic Obstructive Pulmonary Chronic Obstructive Pulmonary Comorbid History: Disease (COPD), Sleep Apnea, Disease (COPD), Sleep Apnea, Disease (COPD), Sleep Apnea, Arrhythmia, Congestive Heart Failure, Arrhythmia, Congestive Heart Failure, Arrhythmia, Congestive Heart Failure, Coronary Artery Disease, Coronary Artery Disease, Coronary Artery Disease, Hypertension, Peripheral Arterial Hypertension, Peripheral Arterial Hypertension, Peripheral Arterial Disease, Cirrhosis , Type II Diabetes, Disease, Cirrhosis , Type II Diabetes, Disease, Cirrhosis , Type II Diabetes, Osteoarthritis, Neuropathy Osteoarthritis, Neuropathy Osteoarthritis, Neuropathy 04/18/2021 11/19/2023 09/15/2023 Date Acquired: 31 1 1  Weeks of Treatment: Open Open Open Wound Status: No No No Wound Recurrence: No No Yes Clustered Wound: N/A N/A 3 Clustered Quantity: 0.4x0.4x0.4 1x1.5x0.1 0.6x1x0.1 Measurements L x W x D (cm) 0.126 1.178 0.471 A (cm)  : rea 0.05 0.118 0.047 Volume (cm) : 86.60% 45.70% 87.50% % Reduction in A rea: 89.40% 45.60% 87.50% % Reduction in Volume: Grade 2 Full Thickness Without Exposed Category/Stage II Classification: Support Structures Medium Medium Medium Exudate A mount: Serosanguineous Serosanguineous Serosanguineous Exudate Type: red, brown red, brown red, brown Exudate Color: Distinct, outline attached N/A N/A Wound Margin: Large (67-100%) None Present (0%) Large (67-100%) Granulation A mount: Red N/A N/A Granulation Quality: Small (1-33%) Large (67-100%) N/A Necrotic A mount: Eschar, Adherent Slough Eschar, Adherent Slough N/A Necrotic Tissue: Fat Layer (Subcutaneous Tissue): Yes Fat Layer (Subcutaneous Tissue): Yes Fat Layer (Subcutaneous Tissue): Yes Exposed Structures: Fascia: No Fascia: No Fascia: No Tendon: No Tendon: No Tendon: No Muscle: No Muscle: No Muscle: No Joint: No Joint: No Joint: No Bone: No Bone: No Bone: No Large (67-100%) Medium (34-66%) None Epithelialization: Debridement - Selective/Open Wound N/A N/A Debridement: Pre-procedure Verification/Time Out 16:08 N/A N/A Taken: Lidocaine  4% T opical Solution N/A N/A Pain Control: Callus, Slough N/A N/A Tissue Debrided: Skin/Epidermis N/A N/A Level: 0.13 N/A N/A Debridement A (sq cm): rea Curette N/A N/A Instrument: Minimum N/A N/A Bleeding: Pressure N/A N/A Hemostasis A chieved: Procedure was tolerated well N/A N/A Debridement Treatment Response: 0.4x0.4x0.4 N/A N/A Post Debridement Measurements L x W x D (cm) 0.05 N/A N/A Post Debridement Volume: (cm) Callus: Yes Excoriation: No Periwound Skin Texture: Induration: No Callus: No Crepitus: No Rash: No Scarring: No Maceration: No Maceration: No Periwound Skin Moisture: Dry/Scaly: No Dry/Scaly: No No Abnormalities Noted Atrophie Blanche: No Periwound Skin Color: Cyanosis: No Ecchymosis: No Erythema: No Hemosiderin Staining:  No Mottled: No Pallor: No Rubor: No No Abnormality N/A N/A Temperature: Debridement N/A N/A Procedures Performed: Treatment Notes Wound #23 (Metatarsal head first) Wound Laterality: Right Cleanser Soap and Water  Karla, Price ([id number]) [id number] Page 4 of 11 Discharge Instruction: May shower and wash wound with dial  antibacterial soap and water  prior to dressing change. Wound Cleanser Discharge Instruction: Cleanse the wound with wound cleanser prior to applying a clean dressing using gauze sponges, not tissue or cotton balls. Peri-Wound Care Sween Lotion (Moisturizing lotion) Discharge Instruction: Apply moisturizing lotion as directed Topical Gentamicin Discharge Instruction: As directed by physician Primary Dressing Maxorb Extra Ag+ Alginate Dressing, 2x2 (in/in) Discharge Instruction: Apply to wound bed as instructed Secondary Dressing ABD Pad, 8x10 Discharge Instruction: Apply over primary dressing as directed. Secured With [employer] 4x5 (in/yd) Discharge Instruction: Secure with Coban as directed. Kerlix Roll Sterile, 4.5x3.1 (in/yd) Discharge Instruction: Secure with Kerlix as directed. 10M Medipore H Soft Cloth Surgical T ape, 4 x 10 (in/yd) Discharge Instruction: Secure with tape as directed. Compression Wrap Compression Stockings Add-Ons Wound #37 (Hand - Dorsum)  Wound Laterality: Left Cleanser Soap and Water  Discharge Instruction: May shower and wash wound with dial  antibacterial soap and water  prior to dressing change. Peri-Wound Care Topical Triple Antibiotic Ointment, 1 (oz) Tube Primary Dressing Secondary Dressing Bordered Gauze, 4x4 in Discharge Instruction: Apply over primary dressing as directed. Secured With Compression Wrap Compression Stockings Add-Ons Wound #38 (Gluteus) Wound Laterality: Right Cleanser Soap and Water  Discharge Instruction: May shower and wash wound with dial  antibacterial soap  and water  prior to dressing change. Peri-Wound Care Topical Triple Antibiotic Ointment, 1 (oz) Tube Primary Dressing Secondary Dressing Zetuvit Plus Silicone Border Dressing 5x5 (in/in) Discharge Instruction: Apply silicone border over primary dressing as directed. Secured With Karla, Price ([id number]) [id number] Page 5 of 11 Compression Wrap Compression Stockings Add-Ons Electronic Signature(Price) Signed: 11/27/2023 5:12:23 PM By: Rosan Raisin DO Entered By: Rosan Raisin on 11/27/2023 16:48:57 -------------------------------------------------------------------------------- Multi-Disciplinary Care Plan Details Patient Name: Date of Service: Karla KNIGHTS, [name] 11/27/2023 3:30 PM Medical Record Number: [id number] Patient Account Number: [id number] Date of Birth/Sex: Treating RN: 06/25/1961 (63 y.o. [name] Primary Care Jari Dipasquale: Delbert Clam Other Clinician: Referring Louna Rothgeb: Treating Jamai Dolce/Extender: Rosan Raisin Delbert Clam Weeks in Treatment: 31 Multidisciplinary Care Plan reviewed with physician Active Inactive Wound/Skin Impairment Nursing Diagnoses: Impaired tissue integrity Goals: Patient/caregiver will verbalize understanding of skin care regimen Date Initiated: 04/19/2023 Target Resolution Date: 12/15/2023 Goal Status: Active Interventions: Assess ulceration(Price) every visit Treatment Activities: Skin care regimen initiated : 04/19/2023 Notes: Electronic Signature(Price) Signed: 11/27/2023 5:06:40 PM By: Cammie Sailors RN, BSN Entered By: Cammie Sailors on 11/27/2023 16:32:34 -------------------------------------------------------------------------------- Pain Assessment Details Patient Name: Date of Service: Karla KNIGHTS, [name] 11/27/2023 3:30 PM Medical Record Number: [id number] Patient Account Number: [id number] Date of Birth/Sex: Treating RN: 16-Aug-1961 (63 y.o. [name] Primary Care Exa Bomba: Delbert Clam Other Clinician: Referring Stepheni Cameron: Treating Lorenna Lurry/Extender: Rosan Raisin Delbert Clam Weeks in Treatment: [address] [place], DIVELBISS Price ([id number]) [id number] Page 6 of 11 Location of Pain Severity and Description of Pain Patient Has Paino No Site Locations Pain Management and Medication Current Pain Management: Electronic Signature(Price) Signed: 11/27/2023 5:06:40 PM By: Cammie Sailors RN, BSN Entered By: Cammie Sailors on 11/27/2023 15:43:15 -------------------------------------------------------------------------------- Patient/Caregiver Education Details Patient Name: Date of Service: Karla KNIGHTS ROSALEA SHERWOOD Price 1/13/2025andnbsp3:30 PM Medical Record Number: [id number] Patient Account Number: [id number] Date of Birth/Gender: Treating RN: 08-24-1961 (63 y.o. [name] Primary Care Physician: Delbert Clam Other Clinician: Referring Physician: Treating Physician/Extender: Rosan Raisin Delbert Clam Weeks in Treatment: 51 Education Assessment Education Provided To: Patient Education Topics Provided Wound/Skin Impairment: Methods: Explain/Verbal Responses: State content correctly [employer]) Signed: 11/27/2023 5:06:40 PM By: Cammie Sailors RN, BSN Entered By: Cammie Sailors on 11/27/2023 16:32:47 -------------------------------------------------------------------------------- Wound Assessment Details Patient Name: Date of Service: Karla KNIGHTS, [name] 11/27/2023 3:30 PM Karla DARALYN Price ([id number]) [id number] Page 7 of 11 Medical Record Number: [id number] Patient Account Number: [id number] Date of Birth/Sex: Treating RN: October 17, 1961 (63 y.o. [name] Primary Care Love Chowning: Delbert Clam Other Clinician: Referring Keshonda Monsour: Treating Vonzella Althaus/Extender: Rosan Raisin Delbert Clam Weeks in Treatment: 31 Wound Status Wound Number: 23 Primary Diabetic Wound/Ulcer of the  Lower Extremity Etiology: Wound Location: Right Metatarsal head first Wound Open Wounding Event: Gradually Appeared Status: Date Acquired: 04/18/2021 Comorbid Chronic Obstructive Pulmonary Disease (COPD), Sleep Apnea, Weeks Of Treatment: 31 History: Arrhythmia, Congestive Heart Failure, Coronary Artery Disease, Clustered Wound: No Hypertension, Peripheral Arterial Disease, Cirrhosis , Type II Diabetes, Osteoarthritis, Neuropathy Photos Wound Measurements Length: (cm) 0.4 Width: (cm)  0.4 Depth: (cm) 0.4 Area: (cm) 0.126 Volume: (cm) 0.05 % Reduction in Area: 86.6% % Reduction in Volume: 89.4% Epithelialization: Large (67-100%) Tunneling: No Undermining: No Wound Description Classification: Grade 2 Wound Margin: Distinct, outline attached Exudate Amount: Medium Exudate Type: Serosanguineous Exudate Color: red, brown Foul Odor After Cleansing: No Slough/Fibrino Yes Wound Bed Granulation Amount: Large (67-100%) Exposed Structure Granulation Quality: Red Fascia Exposed: No Necrotic Amount: Small (1-33%) Fat Layer (Subcutaneous Tissue) Exposed: Yes Necrotic Quality: Eschar, Adherent Slough Tendon Exposed: No Muscle Exposed: No Joint Exposed: No Bone Exposed: No Periwound Skin Texture Texture Color No Abnormalities Noted: No No Abnormalities Noted: Yes Callus: Yes Temperature / Pain Temperature: No Abnormality Moisture No Abnormalities Noted: No Dry / Scaly: No Maceration: No Treatment Notes Wound #23 (Metatarsal head first) Wound Laterality: Right Cleanser Soap and Water  Discharge Instruction: May shower and wash wound with dial  antibacterial soap and water  prior to dressing change. Wound Cleanser Discharge Instruction: Cleanse the wound with wound cleanser prior to applying a clean dressing using gauze sponges, not tissue or cotton balls. Peri-Wound Care Karla, Price ([id number]) [phone].pdf Page 8 of 11 Sween Lotion (Moisturizing  lotion) Discharge Instruction: Apply moisturizing lotion as directed Topical Gentamicin Discharge Instruction: As directed by physician Primary Dressing Maxorb Extra Ag+ Alginate Dressing, 2x2 (in/in) Discharge Instruction: Apply to wound bed as instructed Secondary Dressing ABD Pad, 8x10 Discharge Instruction: Apply over primary dressing as directed. Secured With [employer] 4x5 (in/yd) Discharge Instruction: Secure with Coban as directed. Kerlix Roll Sterile, 4.5x3.1 (in/yd) Discharge Instruction: Secure with Kerlix as directed. 5M Medipore H Soft Cloth Surgical T ape, 4 x 10 (in/yd) Discharge Instruction: Secure with tape as directed. Compression Wrap Compression Stockings Add-Ons Electronic Signature(Price) Signed: 11/27/2023 5:06:40 PM By: Cammie Sailors RN, BSN Entered By: Cammie Sailors on 11/27/2023 15:55:56 -------------------------------------------------------------------------------- Wound Assessment Details Patient Name: Date of Service: Karla KNIGHTS, [name] 11/27/2023 3:30 PM Medical Record Number: [id number] Patient Account Number: [id number] Date of Birth/Sex: Treating RN: 1961-07-16 (63 y.o. [name] Primary Care Barkley Kratochvil: Delbert Clam Other Clinician: Referring Marley Pakula: Treating Terrill Alperin/Extender: Rosan Harlene Delbert Clam Weeks in Treatment: 31 Wound Status Wound Number: 37 Primary Skin T ear Etiology: Wound Location: Left Hand - Dorsum Wound Open Wounding Event: Skin Tear/Laceration Status: Date Acquired: 11/19/2023 Comorbid Chronic Obstructive Pulmonary Disease (COPD), Sleep Apnea, Weeks Of Treatment: 1 History: Arrhythmia, Congestive Heart Failure, Coronary Artery Disease, Clustered Wound: No Hypertension, Peripheral Arterial Disease, Cirrhosis , Type II Diabetes, Osteoarthritis, Neuropathy Photos Wound Measurements Karla, ERBY Price ([id number]) Length: (cm) 1 Width: (cm) 1.5 Depth: (cm) 0.1 Area: (cm) 1.178 Volume: (cm)  0.118 [id number] Page 9 of 11 % Reduction in Area: 45.7% % Reduction in Volume: 45.6% Epithelialization: Medium (34-66%) Tunneling: No Undermining: No Wound Description Classification: Full Thickness Without Exposed Suppor Exudate Amount: Medium Exudate Type: Serosanguineous Exudate Color: red, brown t Structures Foul Odor After Cleansing: No Slough/Fibrino Yes Wound Bed Granulation Amount: None Present (0%) Exposed Structure Necrotic Amount: Large (67-100%) Fascia Exposed: No Necrotic Quality: Eschar, Adherent Slough Fat Layer (Subcutaneous Tissue) Exposed: Yes Tendon Exposed: No Muscle Exposed: No Joint Exposed: No Bone Exposed: No Periwound Skin Texture Texture Color No Abnormalities Noted: No No Abnormalities Noted: No Moisture No Abnormalities Noted: No Treatment Notes Wound #37 (Hand - Dorsum) Wound Laterality: Left Cleanser Soap and Water  Discharge Instruction: May shower and wash wound with dial  antibacterial soap and water  prior to dressing change. Peri-Wound Care Topical Triple Antibiotic Ointment, 1 (oz) Tube Primary Dressing Secondary Dressing Bordered  Gauze, 4x4 in Discharge Instruction: Apply over primary dressing as directed. Secured With Compression Wrap Compression Stockings Facilities Manager) Signed: 11/27/2023 5:06:40 PM By: Cammie Sailors RN, BSN Entered By: Cammie Sailors on 11/27/2023 15:56:26 -------------------------------------------------------------------------------- Wound Assessment Details Patient Name: Date of Service: Karla KNIGHTS, [name] 11/27/2023 3:30 PM Medical Record Number: [id number] Patient Account Number: [id number] Date of Birth/Sex: Treating RN: 1961/10/30 (63 y.o. [name] Primary Care Nakul Avino: Delbert Clam Other Clinician: Referring Sandrine Bloodsworth: Treating Yitzhak Awan/Extender: Rosan Harlene Delbert Clam Weeks in Treatment: [address] Karla, Price ([id number])  [id number] Page 10 of 11 Wound Number: 38 Primary Pressure Ulcer Etiology: Wound Location: Right Gluteus Wound Open Wounding Event: Gradually Appeared Status: Date Acquired: 09/15/2023 Comorbid Chronic Obstructive Pulmonary Disease (COPD), Sleep Apnea, Weeks Of Treatment: 1 History: Arrhythmia, Congestive Heart Failure, Coronary Artery Disease, Clustered Wound: Yes Hypertension, Peripheral Arterial Disease, Cirrhosis , Type II Diabetes, Osteoarthritis, Neuropathy Photos Wound Measurements Length: (cm) 0.6 Width: (cm) 1 Depth: (cm) 0.1 Clustered Quantity: 3 Area: (cm) 0.47 Volume: (cm) 0.04 % Reduction in Area: 87.5% % Reduction in Volume: 87.5% Epithelialization: None 1 7 Wound Description Classification: Category/Stage II Exudate Amount: Medium Exudate Type: Serosanguineous Exudate Color: red, brown Foul Odor After Cleansing: No Slough/Fibrino Yes Wound Bed Granulation Amount: Large (67-100%) Exposed Structure Fascia Exposed: No Fat Layer (Subcutaneous Tissue) Exposed: Yes Tendon Exposed: No Muscle Exposed: No Joint Exposed: No Bone Exposed: No Periwound Skin Texture Texture Color No Abnormalities Noted: No No Abnormalities Noted: No Callus: No Atrophie Blanche: No Crepitus: No Cyanosis: No Excoriation: No Ecchymosis: No Induration: No Erythema: No Rash: No Hemosiderin Staining: No Scarring: No Mottled: No Pallor: No Moisture Rubor: No No Abnormalities Noted: No Dry / Scaly: No Maceration: No Treatment Notes Wound #38 (Gluteus) Wound Laterality: Right Cleanser Soap and Water  Discharge Instruction: May shower and wash wound with dial  antibacterial soap and water  prior to dressing change. Peri-Wound Care Topical Triple Antibiotic Ointment, 1 (oz) Tube Primary Dressing Karla, Price ([id number]) [phone].pdf Page 11 of 11 Secondary Dressing Zetuvit Plus Silicone Border Dressing 5x5  (in/in) Discharge Instruction: Apply silicone border over primary dressing as directed. Secured With Compression Wrap Compression Stockings Facilities Manager) Signed: 11/27/2023 5:06:40 PM By: Cammie Sailors RN, BSN Entered By: Cammie Sailors on 11/27/2023 15:56:58 -------------------------------------------------------------------------------- Vitals Details Patient Name: Date of Service: Karla KNIGHTS, [name] 11/27/2023 3:30 PM Medical Record Number: [id number] Patient Account Number: [id number] Date of Birth/Sex: Treating RN: 01-21-1961 (63 y.o. [name] Primary Care Azariyah Luhrs: Delbert Clam Other Clinician: Referring Arris Meyn: Treating Tiburcio Linder/Extender: Rosan Harlene Delbert Clam Weeks in Treatment: 31 Vital Signs Time Taken: 15:42 Temperature (F): 98.0 Height (in): 64 Pulse (bpm): 82 Weight (lbs): 225 Respiratory Rate (breaths/min): 18 Body Mass Index (BMI): 38.6 Blood Pressure (mmHg): 120/76 Reference Range: 80 - 120 mg / dl Electronic Signature(Price) Signed: 11/27/2023 5:06:40 PM By: Cammie Sailors RN, BSN Entered By: Cammie Sailors on 11/27/2023 15:43:03

## 2023-12-04 ENCOUNTER — Telehealth (HOSPITAL_COMMUNITY): Payer: Self-pay

## 2023-12-04 ENCOUNTER — Telehealth (HOSPITAL_COMMUNITY): Payer: Self-pay | Admitting: Licensed Clinical Social Worker

## 2023-12-04 NOTE — Telephone Encounter (Signed)
Pt called to inquire about missed messages from clinic.  No phone call attempts documented by clinic staff- see that pt has appt on 1/23 with clinic so informed pt that missed calls likely reminder calls- reiterated appt details to patient who was unaware of appt.  Burna Sis, LCSW Clinical Social Worker [facility] Desk#: [phone] Cell#: [phone]

## 2023-12-04 NOTE — Telephone Encounter (Signed)
Contacted [facility] to request the following for refills:  ssymbicort   Kerry Hough, Paramedic [phone] 12/04/23

## 2023-12-06 ENCOUNTER — Other Ambulatory Visit (HOSPITAL_COMMUNITY): Payer: Self-pay

## 2023-12-06 NOTE — Progress Notes (Signed)
Paramedicine Encounter    Patient ID: [name], female    DOB: [birth year], 63 y.o.   MRN: [id number]   Complaints-foot and leg pain   Edema-none  Compliance with meds-yes  Pill box filled-yes  If so, by whom-paramedic   Refills needed-eliquis-will take [facility] to start bubble packs  Pt reports other than her legs hurting she is feeling ok.  She denies increased sob, no dizziness or c/p.  Weight doing good.   She forgot about her clinic appoint tomor again.  Reminded her and her sister is able to take her.  Meds verified and pill box refilled.   If no changes tomor at clinic I will take meds to [facility] to start her back on bubble packs per her request.  Coarse lung sounds to bilateral lower and to upper rt side-not unusual for her.  Legs look good. She had to take off the leg wraps b/c she wanted to take a bath.  HHN still coming out.       BP 124/62   Pulse 61   Resp 18   Wt 205 lb (93 kg)   LMP  (LMP Unknown)   SpO2 98%   BMI 37.49 kg/m  Weight yesterday-206 Last visit weight-209  Patient Care Team: Runell Gess, MD as PCP - Cardiology (Cardiology) Lanier Prude, MD as PCP - Electrophysiology (Cardiology) Runell Gess, MD as Consulting Physician (Cardiology) Nyoka Cowden, MD as Consulting Physician (Pulmonary Disease) System, Provider Not In  Patient Active Problem List   Diagnosis Date Noted   Heart failure ([name]) 10/11/2023   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) and diastolic (congestive) heart failure ([name]) 10/10/2023   Pre-ulcerative calluses 04/25/2022   COPD (chronic obstructive pulmonary disease) ([name]) 05/05/2021   OSA (obstructive sleep apnea) 05/05/2021   Stage 3b chronic kidney disease ([name]) 05/05/2021   Pressure injury of skin 05/04/2021   Diabetic nephropathy ([name]) 01/02/2021   Low back pain 06/01/2020   Hyperlipidemia 04/03/2020   PAD (peripheral artery disease) ([name]) 12/26/2019   NICM  (nonischemic cardiomyopathy) ([name]) 06/20/2019   Non-healing ulcer ([name]) 06/20/2019   Coagulation disorder ([name]) 08/09/2017   Depression 07/21/2017   At risk for adverse drug reaction 06/20/2017   Peripheral neuropathy 06/20/2017   S/P transmetatarsal amputation of foot, right ([name]) 06/05/2017   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) 05/19/2017   Obesity, class 2 02/24/2016   Encounter for therapeutic drug monitoring 02/10/2016   Symptomatic bradycardia 01/12/2016   Essential hypertension 12/22/2015   Elevated troponin    Acute on chronic diastolic CHF (congestive heart failure) ([name])    Anemia of chronic disease 11/08/2015   Hypokalemia 11/08/2015   Tobacco abuse 10/23/2015   Coronary artery disease    DOE (dyspnea on exertion) 04/29/2015   PAF (paroxysmal atrial fibrillation) ([name]) 01/16/2015   Carotid arterial disease ([name]) 01/16/2015   Insomnia 02/03/2014   S/P peripheral artery angioplasty - TurboHawk atherectomy; R SFA 09/11/2013    Class: Acute   Leg pain, bilateral 08/19/2013   Hypothyroidism 07/31/2013   History of cocaine abuse ([name]) 06/13/2013   Long term current use of anticoagulant therapy 05/20/2013   Alcohol abuse    Narcotic abuse ([name])    Marijuana abuse    Alcoholic cirrhosis ([name])    Type 2 diabetes mellitus with hyperlipidemia ([name])     Current Outpatient Medications:    ACCU-CHEK GUIDE test strip, USE TO CHECK BLOOD  SUGAR FOUR TIMES DAILY, Disp: , Rfl:    acetaminophen (TYLENOL) 500 MG tablet, Take 1,000 mg by mouth every 6 (six) hours as needed for headache (pain)., Disp: , Rfl:    albuterol (VENTOLIN HFA) 108 (90 Base) MCG/ACT inhaler, USE 2 PUFFS BY MOUTH EVERY FOUR HOURS, AS NEEDED, FOR COUGHING/WHEEZING, Disp: 8.5 g, Rfl: 0   allopurinol (ZYLOPRIM) 100 MG tablet, Take 150 mg by mouth daily., Disp: , Rfl:    apixaban (ELIQUIS) 5 MG TABS tablet, Take 1 tablet (5 mg total) by mouth 2 (two) times  daily., Disp: 60 tablet, Rfl: 6   budesonide-formoterol (SYMBICORT) 160-4.5 MCG/ACT inhaler, Inhale 2 puffs into the lungs 2 (two) times daily., Disp: 1 each, Rfl: 2   buPROPion ER (WELLBUTRIN SR) 100 MG 12 hr tablet, Take 1 tablet (100 mg total) by mouth daily., Disp: 30 tablet, Rfl: 6   Cholecalciferol (VITAMIN D3 PO), Take 1 capsule by mouth every morning., Disp: , Rfl:    insulin lispro (HUMALOG) 100 UNIT/ML KwikPen, Inject 10 Units into the skin 3 (three) times daily with meals., Disp: 15 mL, Rfl: 0   LANTUS SOLOSTAR 100 UNIT/ML Solostar Pen, Inject 37 Units into the skin daily. (Patient taking differently: Inject 40 Units into the skin daily.), Disp: 15 mL, Rfl: 0   levothyroxine (SYNTHROID) 50 MCG tablet, TAKE 1 TABLET (50 MCG TOTAL) BY MOUTH DAILY BEFORE BREAKFAST (AM), Disp: 30 tablet, Rfl: 0   metolazone (ZAROXOLYN) 2.5 MG tablet, Take 1 tablet (2.5 mg total) by mouth once a week. Every Monday, Disp: 15 tablet, Rfl: 3   metoprolol succinate (TOPROL-XL) 25 MG 24 hr tablet, Take 1 tablet (25 mg total) by mouth daily., Disp: 90 tablet, Rfl: 3   pantoprazole (PROTONIX) 40 MG tablet, TAKE 1 TABLET (40 MG TOTAL) BY MOUTH DAILY(AM), Disp: 30 tablet, Rfl: 0   potassium chloride SA (KLOR-CON M) 20 MEQ tablet, Take 2 tablets (40 mEq total) by mouth once a week. With every dose of metolazone, Disp: , Rfl:    rosuvastatin (CRESTOR) 40 MG tablet, TAKE 1 TABLET (40 MG TOTAL) BY MOUTH DAILY (Patient taking differently: Take 40 mg by mouth at bedtime.), Disp: 15 tablet, Rfl: 0   torsemide (DEMADEX) 100 MG tablet, Take 1 tablet (100 mg total) by mouth 2 (two) times daily., Disp: 180 tablet, Rfl: 3   VITAMIN E PO, Take 1 tablet by mouth daily., Disp: , Rfl:    colchicine 0.6 MG tablet, Take 1.2 mg by mouth See admin instructions. Take 1.2 mg for flair up and an hour later take 0.6 mg if needed (Patient not taking: Reported on 11/28/2023), Disp: , Rfl:    doxycycline (VIBRAMYCIN) 100 MG capsule, Take 1 capsule  (100 mg total) by mouth 2 (two) times daily. (Patient not taking: Reported on 11/06/2023), Disp: 20 capsule, Rfl: 0   EASY COMFORT PEN NEEDLES 31G X 5 MM MISC, USE FOUR TIMES A DAY FOR INSULIN ADMINISTRATION, Disp: 100 each, Rfl: 1   fluticasone (FLONASE) 50 MCG/ACT nasal spray, Place 1 spray into both nostrils as needed for allergies or rhinitis. (Patient not taking: Reported on 11/21/2023), Disp: , Rfl:    Furosemide (FUROSCIX [place]), Inject into the skin as directed. As needed (Patient not taking: Reported on 12/06/2023), Disp: , Rfl:    losartan (COZAAR) 25 MG tablet, Take 1 tablet (25 mg total) by mouth daily., Disp: 30 tablet, Rfl: 0   polyethylene glycol (MIRALAX / GLYCOLAX) 17 g packet, Take 17 g by mouth daily  as needed for moderate constipation., Disp: 14 each, Rfl: 0   spironolactone (ALDACTONE) 25 MG tablet, Take 1 tablet (25 mg total) by mouth daily., Disp: 90 tablet, Rfl: 3   triamcinolone ointment (KENALOG) 0.1 %, Apply topically 2 (two) times daily., Disp: 454 g, Rfl: 0 Allergies  Allergen Reactions   Gabapentin Nausea And Vomiting and Other (See Comments)    POSSIBLE SHAKING   Lyrica [Pregabalin] Other (See Comments)    Shaking       Social History   Socioeconomic History   Marital status: Single    Spouse name: Not on file   Number of children: 1   Years of education: 12   Highest education level: 12th grade  Occupational History   Occupation: disabled  Tobacco Use   Smoking status: Every Day    Current packs/day: 1.00    Average packs/day: 1 pack/day for 44.0 years (44.0 ttl pk-yrs)    Types: E-cigarettes, Cigarettes   Smokeless tobacco: Former    Types: Snuff  Vaping Use   Vaping status: Former   Devices: 11/26/2018 "stopped months ago"  Substance and Sexual Activity   Alcohol use: Not Currently    Comment: occ   Drug use: Not Currently    Types: "Crack" cocaine, Marijuana, Oxycodone   Sexual activity: Not Currently  Other Topics Concern   Not on file   Social History Narrative   ** Merged History Encounter **       Lives in [place], in motel with sister.  They are looking to move but don't have a place to go yet.     Social Drivers of Corporate investment banker Strain: Low Risk  (02/09/2023)   Overall Financial Resource Strain (CARDIA)    Difficulty of Paying Living Expenses: Not hard at all  Food Insecurity: No Food Insecurity (10/10/2023)   Hunger Vital Sign    Worried About Running Out of Food in the Last Year: Never true    Ran Out of Food in the Last Year: Never true  Transportation Needs: No Transportation Needs (10/10/2023)   PRAPARE - Administrator, Civil Service (Medical): No    Lack of Transportation (Non-Medical): No  Physical Activity: Inactive (02/09/2023)   Exercise Vital Sign    Days of Exercise per Week: 0 days    Minutes of Exercise per Session: 0 min  Stress: No Stress Concern Present (02/09/2023)   [employer] of Occupational Health - Occupational Stress Questionnaire    Feeling of Stress : Not at all  Social Connections: Unknown (08/21/2023)   Received from [facility]   Social Network    Social Network: Not on file  Intimate Partner Violence: Not At Risk (10/10/2023)   Humiliation, Afraid, Rape, and Kick questionnaire    Fear of Current or Ex-Partner: No    Emotionally Abused: No    Physically Abused: No    Sexually Abused: No    Physical Exam      Future Appointments  Date Time Provider Department Center  12/07/2023 11:30 AM [facility] PA/NP [facility] None  12/12/2023 12:00 PM Regan Lemming, MD [facility]  12/13/2023  2:45 PM Camelia Phenes, DO [facility]  01/22/2024 10:00 AM [facility] END[facility]/NEURO LAB [facility] None  01/25/2024  2:00 PM Altamese [place], MD [facility] None  02/13/2024  2:20 PM [facility] ANNUAL WELLNESS VISIT [facility] None  04/09/2024  3:00 PM [facility] HS VASC 4 [facility]  04/09/2024  4:00 PM Leonie Douglas, MD [facility]  Kerry Hough, Paramedic [phone] [facility] Paramedic  12/06/23

## 2023-12-07 ENCOUNTER — Encounter (HOSPITAL_COMMUNITY): Payer: Self-pay

## 2023-12-07 ENCOUNTER — Other Ambulatory Visit (HOSPITAL_COMMUNITY): Payer: Self-pay

## 2023-12-07 ENCOUNTER — Ambulatory Visit (HOSPITAL_COMMUNITY)
Admission: RE | Admit: 2023-12-07 | Discharge: 2023-12-07 | Disposition: A | Discharge status: Home / Self Care | Payer: 59 | Source: Ambulatory Visit | Attending: Internal Medicine | Admitting: Internal Medicine

## 2023-12-07 ENCOUNTER — Telehealth (HOSPITAL_COMMUNITY): Payer: Self-pay | Admitting: *Deleted

## 2023-12-07 VITALS — BP 124/60 | HR 66 | Wt 211.4 lb

## 2023-12-07 DIAGNOSIS — E1151 Type 2 diabetes mellitus with diabetic peripheral angiopathy without gangrene: Secondary | ICD-10-CM

## 2023-12-07 DIAGNOSIS — I6529 Occlusion and stenosis of unspecified carotid artery: Secondary | ICD-10-CM | POA: Diagnosis not present

## 2023-12-07 DIAGNOSIS — N1831 Chronic kidney disease, stage 3a: Secondary | ICD-10-CM

## 2023-12-07 DIAGNOSIS — I10 Essential (primary) hypertension: Secondary | ICD-10-CM

## 2023-12-07 DIAGNOSIS — G4733 Obstructive sleep apnea (adult) (pediatric): Secondary | ICD-10-CM | POA: Diagnosis not present

## 2023-12-07 DIAGNOSIS — I130 Hypertensive heart and chronic kidney disease with heart failure and stage 1 through stage 4 chronic kidney disease, or unspecified chronic kidney disease: Secondary | ICD-10-CM | POA: Diagnosis not present

## 2023-12-07 DIAGNOSIS — N183 Chronic kidney disease, stage 3 unspecified: Secondary | ICD-10-CM | POA: Diagnosis not present

## 2023-12-07 DIAGNOSIS — Z8719 Personal history of other diseases of the digestive system: Secondary | ICD-10-CM

## 2023-12-07 DIAGNOSIS — I5022 Chronic systolic (congestive) heart failure: Secondary | ICD-10-CM | POA: Diagnosis not present

## 2023-12-07 DIAGNOSIS — I5043 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) and diastolic (congestive) heart failure: Secondary | ICD-10-CM | POA: Diagnosis present

## 2023-12-07 DIAGNOSIS — K7460 Unspecified cirrhosis of liver: Secondary | ICD-10-CM | POA: Diagnosis not present

## 2023-12-07 DIAGNOSIS — E1122 Type 2 diabetes mellitus with diabetic chronic kidney disease: Secondary | ICD-10-CM | POA: Diagnosis not present

## 2023-12-07 DIAGNOSIS — E785 Hyperlipidemia, unspecified: Secondary | ICD-10-CM | POA: Insufficient documentation

## 2023-12-07 DIAGNOSIS — I739 Peripheral vascular disease, unspecified: Secondary | ICD-10-CM

## 2023-12-07 DIAGNOSIS — I480 Paroxysmal atrial fibrillation: Secondary | ICD-10-CM

## 2023-12-07 DIAGNOSIS — E782 Mixed hyperlipidemia: Secondary | ICD-10-CM

## 2023-12-07 DIAGNOSIS — I2510 Atherosclerotic heart disease of native coronary artery without angina pectoris: Secondary | ICD-10-CM | POA: Diagnosis present

## 2023-12-07 DIAGNOSIS — Z794 Long term (current) use of insulin: Secondary | ICD-10-CM | POA: Insufficient documentation

## 2023-12-07 DIAGNOSIS — Z79899 Other long term (current) drug therapy: Secondary | ICD-10-CM | POA: Insufficient documentation

## 2023-12-07 DIAGNOSIS — F17210 Nicotine dependence, cigarettes, uncomplicated: Secondary | ICD-10-CM | POA: Diagnosis not present

## 2023-12-07 DIAGNOSIS — J449 Chronic obstructive pulmonary disease, unspecified: Secondary | ICD-10-CM

## 2023-12-07 LAB — BASIC METABOLIC PANEL
Anion gap: 14 (ref 5–15)
BUN: 112 mg/dL — ABNORMAL HIGH (ref 8–23)
CO2: 26 mmol/L (ref 22–32)
Calcium: 9.3 mg/dL (ref 8.9–10.3)
Chloride: 94 mmol/L — ABNORMAL LOW (ref 98–111)
Creatinine, Ser: 2.86 mg/dL — ABNORMAL HIGH (ref 0.44–1.00)
GFR, Estimated: 18 mL/min — ABNORMAL LOW (ref >60)
Glucose, Bld: 212 mg/dL — ABNORMAL HIGH (ref 70–99)
Potassium: 4.3 mmol/L (ref 3.5–5.1)
Sodium: 134 mmol/L — ABNORMAL LOW (ref 135–145)

## 2023-12-07 LAB — BRAIN NATRIURETIC PEPTIDE: B Natriuretic Peptide: 196 pg/mL — ABNORMAL HIGH (ref 0.0–100.0)

## 2023-12-07 MED ORDER — TORSEMIDE 40 MG PO TABS: 80.0000 mg | ORAL_TABLET | Freq: Two times a day (BID) | ORAL | 3 refills | Status: DC

## 2023-12-07 NOTE — Progress Notes (Signed)
Paramedicine Encounter   Patient ID: [name] , female,   DOB: Nov 14, 1961,62 y.o.,  MRN: [id number]   Met patient in clinic today with provider.   Weight @ clinic-211 B/P-124/60 P-66 [id number]  She looks good today.  Cardiomems on goal.  Nurse came out to wrap her leg yesterday not long after I left.  Labs today.   Med Ivy Lynn, EMT-Paramedic [phone] 12/07/2023

## 2023-12-07 NOTE — Patient Instructions (Signed)
No medication changes. Labs today - will call you if abnormal. Return to Heart Failure APP Clinic in 6 - 8 weeks. See below. Please call us at [phone] if any questions or concerns prior to your next visit.

## 2023-12-07 NOTE — Telephone Encounter (Signed)
Called [name] Medical laboratory scientific officer) per Karla Peon, PA with following lab results and instructions:  "Volume much improved but now renal function is elevated. Hold this weeks metolazone if she has not already taken it. Hold Torsemide tomorrow. Restart at 80 mg BID and keep weekly metolazone after holding it for 1 dose. Repeat BMET 1 week."  [name] confirmed she took metolazone this past Monday; she will hold one dose on Monday 12/11/23 then resume weekly dose. She will hold Torsemide tomorrow and then  resume at 80 mg twice daily (she currently has 100 mg tablets). New Rx sent, lab ordered and scheduled. [name] verbalized understanding of above.

## 2023-12-07 NOTE — Progress Notes (Signed)
Paramedicine Encounter    Patient ID: [name], female    DOB: Dec 15, 1960, 63 y.o.   MRN: [id number]  Per clinic her labs came back abnormal so med changes are needed.   Per clinic -to hold metolazone for next Monday, can resume the Monday after  Hold torsemide tomor.  Resume torsemide to 80mg  BID on Saturday.  Labs need to rechecked next week-that appoint was made.   So I had to p/u new rx from pharmacy b/c she had 100mg  tabs of torsemide.  Changes listed above were made to pill box.  I went ahead and topped off rest of her pill box so now I am due to see her next Thursday so will come after her labs have resulted so I can make changes while I am there for a routine visit if they are needed.   Refills needed- eliquis, metolazone   Patient Care Team: Runell Gess, MD as PCP - Cardiology (Cardiology) Lanier Prude, MD as PCP - Electrophysiology (Cardiology) Runell Gess, MD as Consulting Physician (Cardiology) Nyoka Cowden, MD as Consulting Physician (Pulmonary Disease) System, Provider Not In  Patient Active Problem List   Diagnosis Date Noted   Heart failure ([name]) 10/11/2023   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) and diastolic (congestive) heart failure ([name]) 10/10/2023   Pre-ulcerative calluses 04/25/2022   COPD (chronic obstructive pulmonary disease) ([name]) 05/05/2021   OSA (obstructive sleep apnea) 05/05/2021   Stage 3b chronic kidney disease ([name]) 05/05/2021   Pressure injury of skin 05/04/2021   Diabetic nephropathy ([name]) 01/02/2021   Low back pain 06/01/2020   Hyperlipidemia 04/03/2020   PAD (peripheral artery disease) ([name]) 12/26/2019   NICM (nonischemic cardiomyopathy) ([name]) 06/20/2019   Non-healing ulcer ([name]) 06/20/2019   Coagulation disorder ([name]) 08/09/2017   Depression 07/21/2017   At risk for adverse drug reaction 06/20/2017   Peripheral neuropathy 06/20/2017   S/P transmetatarsal amputation of foot, right ([name]) 06/05/2017    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) 05/19/2017   Obesity, class 2 02/24/2016   Encounter for therapeutic drug monitoring 02/10/2016   Symptomatic bradycardia 01/12/2016   Essential hypertension 12/22/2015   Elevated troponin    Acute on chronic diastolic CHF (congestive heart failure) ([name])    Anemia of chronic disease 11/08/2015   Hypokalemia 11/08/2015   Tobacco abuse 10/23/2015   Coronary artery disease    DOE (dyspnea on exertion) 04/29/2015   PAF (paroxysmal atrial fibrillation) ([name]) 01/16/2015   Carotid arterial disease ([name]) 01/16/2015   Insomnia 02/03/2014   S/P peripheral artery angioplasty - TurboHawk atherectomy; R SFA 09/11/2013    Class: Acute   Leg pain, bilateral 08/19/2013   Hypothyroidism 07/31/2013   History of cocaine abuse ([name]) 06/13/2013   Long term current use of anticoagulant therapy 05/20/2013   Alcohol abuse    Narcotic abuse ([name])    Marijuana abuse    Alcoholic cirrhosis ([name])    Type 2 diabetes mellitus with hyperlipidemia ([name])     Current Outpatient Medications:    ACCU-CHEK GUIDE test strip, USE TO CHECK BLOOD SUGAR FOUR TIMES DAILY, Disp: , Rfl:    acetaminophen (TYLENOL) 500 MG tablet, Take 1,000 mg by mouth every 6 (six) hours as needed for headache (pain)., Disp: , Rfl:    albuterol (VENTOLIN HFA) 108 (90 Base) MCG/ACT inhaler, USE 2 PUFFS BY MOUTH EVERY FOUR HOURS, AS NEEDED, FOR COUGHING/WHEEZING, Disp: 8.5 g, Rfl: 0   allopurinol (ZYLOPRIM) 100  MG tablet, Take 150 mg by mouth daily., Disp: , Rfl:    apixaban (ELIQUIS) 5 MG TABS tablet, Take 1 tablet (5 mg total) by mouth 2 (two) times daily., Disp: 60 tablet, Rfl: 6   budesonide-formoterol (SYMBICORT) 160-4.5 MCG/ACT inhaler, Inhale 2 puffs into the lungs 2 (two) times daily., Disp: 1 each, Rfl: 2   buPROPion ER (WELLBUTRIN SR) 100 MG 12 hr tablet, Take 1 tablet (100 mg total) by mouth daily., Disp: 30 tablet, Rfl: 6    Cholecalciferol (VITAMIN D3 PO), Take 1 capsule by mouth every morning., Disp: , Rfl:    colchicine 0.6 MG tablet, Take 1.2 mg by mouth See admin instructions. Take 1.2 mg for flair up and an hour later take 0.6 mg if needed, Disp: , Rfl:    EASY COMFORT PEN NEEDLES 31G X 5 MM MISC, USE FOUR TIMES A DAY FOR INSULIN ADMINISTRATION, Disp: 100 each, Rfl: 1   fluticasone (FLONASE) 50 MCG/ACT nasal spray, Place 1 spray into both nostrils as needed for allergies or rhinitis., Disp: , Rfl:    Furosemide (FUROSCIX [place]), Inject into the skin as directed. As needed, Disp: , Rfl:    insulin lispro (HUMALOG) 100 UNIT/ML KwikPen, Inject 10 Units into the skin 3 (three) times daily with meals., Disp: 15 mL, Rfl: 0   LANTUS SOLOSTAR 100 UNIT/ML Solostar Pen, Inject 37 Units into the skin daily. (Patient taking differently: Inject 40 Units into the skin daily.), Disp: 15 mL, Rfl: 0   levothyroxine (SYNTHROID) 50 MCG tablet, TAKE 1 TABLET (50 MCG TOTAL) BY MOUTH DAILY BEFORE BREAKFAST (AM), Disp: 30 tablet, Rfl: 0   losartan (COZAAR) 25 MG tablet, Take 1 tablet (25 mg total) by mouth daily., Disp: 30 tablet, Rfl: 0   metolazone (ZAROXOLYN) 2.5 MG tablet, Take 1 tablet (2.5 mg total) by mouth once a week. Every Monday, Disp: 15 tablet, Rfl: 3   metoprolol succinate (TOPROL-XL) 25 MG 24 hr tablet, Take 1 tablet (25 mg total) by mouth daily., Disp: 90 tablet, Rfl: 3   Omega-3 Fatty Acids (FISH OIL PO), Take 1 tablet by mouth daily., Disp: , Rfl:    pantoprazole (PROTONIX) 40 MG tablet, TAKE 1 TABLET (40 MG TOTAL) BY MOUTH DAILY(AM), Disp: 30 tablet, Rfl: 0   polyethylene glycol (MIRALAX / GLYCOLAX) 17 g packet, Take 17 g by mouth daily as needed for moderate constipation., Disp: 14 each, Rfl: 0   potassium chloride SA (KLOR-CON M) 20 MEQ tablet, Take 2 tablets (40 mEq total) by mouth once a week. With every dose of metolazone, Disp: , Rfl:    rosuvastatin (CRESTOR) 40 MG tablet, TAKE 1 TABLET (40 MG TOTAL) BY MOUTH DAILY  (Patient taking differently: Take 40 mg by mouth at bedtime.), Disp: 15 tablet, Rfl: 0   spironolactone (ALDACTONE) 25 MG tablet, Take 1 tablet (25 mg total) by mouth daily., Disp: 90 tablet, Rfl: 3   torsemide 40 MG TABS, Take 80 mg by mouth 2 (two) times daily., Disp: 360 tablet, Rfl: 3   triamcinolone ointment (KENALOG) 0.1 %, Apply topically 2 (two) times daily., Disp: 454 g, Rfl: 0   VITAMIN E PO, Take 1 tablet by mouth daily., Disp: , Rfl:  Allergies  Allergen Reactions   Gabapentin Nausea And Vomiting and Other (See Comments)    POSSIBLE SHAKING   Lyrica [Pregabalin] Other (See Comments)    Shaking       Social History   Socioeconomic History   Marital status: Single  Spouse name: Not on file   Number of children: 1   Years of education: 52   Highest education level: 12th grade  Occupational History   Occupation: disabled  Tobacco Use   Smoking status: Every Day    Current packs/day: 1.00    Average packs/day: 1 pack/day for 44.0 years (44.0 ttl pk-yrs)    Types: E-cigarettes, Cigarettes   Smokeless tobacco: Former    Types: Snuff  Vaping Use   Vaping status: Former   Devices: 11/26/2018 "stopped months ago"  Substance and Sexual Activity   Alcohol use: Not Currently    Comment: occ   Drug use: Not Currently    Types: "Crack" cocaine, Marijuana, Oxycodone   Sexual activity: Not Currently  Other Topics Concern   Not on file  Social History Narrative   ** Merged History Encounter **       Lives in [place], in motel with sister.  They are looking to move but don't have a place to go yet.     Social Drivers of Corporate investment banker Strain: Low Risk  (02/09/2023)   Overall Financial Resource Strain (CARDIA)    Difficulty of Paying Living Expenses: Not hard at all  Food Insecurity: No Food Insecurity (10/10/2023)   Hunger Vital Sign    Worried About Running Out of Food in the Last Year: Never true    Ran Out of Food in the Last Year: Never true   Transportation Needs: No Transportation Needs (10/10/2023)   PRAPARE - Administrator, Civil Service (Medical): No    Lack of Transportation (Non-Medical): No  Physical Activity: Inactive (02/09/2023)   Exercise Vital Sign    Days of Exercise per Week: 0 days    Minutes of Exercise per Session: 0 min  Stress: No Stress Concern Present (02/09/2023)   [employer] of Occupational Health - Occupational Stress Questionnaire    Feeling of Stress : Not at all  Social Connections: Unknown (08/21/2023)   [facility] from [facility]   Social Network    Social Network: Not on file  Intimate Partner Violence: Not At Risk (10/10/2023)   Humiliation, Afraid, Rape, and Kick questionnaire    Fear of Current or Ex-Partner: No    Emotionally Abused: No    Physically Abused: No    Sexually Abused: No    Physical Exam      Future Appointments  Date Time Provider Department Center  12/12/2023 12:00 PM Regan Lemming, MD [facility]  12/13/2023  2:45 PM Camelia Phenes, DO [facility]  12/14/2023 10:00 AM [facility] LAB [facility] None  01/22/2024 10:00 AM [facility] END[facility]/NEURO LAB [facility] None  01/25/2024  2:00 PM Altamese [place], MD [facility] None  02/08/2024 11:30 AM [facility] PA/NP [facility] None  02/13/2024  2:20 PM [facility] ANNUAL WELLNESS VISIT [facility] None  04/09/2024  3:00 PM [facility] HS VASC 4 [facility]  04/09/2024  4:00 PM Leonie Douglas, MD [facility]       Kerry Hough, Paramedic [phone] [facility] Paramedic  12/07/23

## 2023-12-12 ENCOUNTER — Ambulatory Visit: Payer: 59 | Attending: Cardiology | Admitting: Cardiology

## 2023-12-12 ENCOUNTER — Encounter: Payer: Self-pay | Admitting: Cardiology

## 2023-12-12 ENCOUNTER — Telehealth (HOSPITAL_COMMUNITY): Payer: Self-pay

## 2023-12-12 VITALS — BP 120/62 | HR 70 | Ht 62.0 in | Wt 208.0 lb

## 2023-12-12 DIAGNOSIS — I480 Paroxysmal atrial fibrillation: Secondary | ICD-10-CM

## 2023-12-12 DIAGNOSIS — I5022 Chronic systolic (congestive) heart failure: Secondary | ICD-10-CM | POA: Diagnosis not present

## 2023-12-12 DIAGNOSIS — D6869 Other thrombophilia: Secondary | ICD-10-CM

## 2023-12-12 NOTE — Progress Notes (Signed)
Electrophysiology Office Note:   Date:  12/12/2023  ID:  SUNDY HOUCHINS, DOB 1960/11/25, MRN [id number]  Primary Cardiologist: Nanetta Batty, MD Primary Heart Failure: Marca Ancona, MD Electrophysiologist: Regan Lemming, MD      History of Present Illness:   [name] is a 63 y.o. female with h/o PAD, carotid stenosis post left CEA, mild coronary artery disease, chronic systolic heart failure, atrial fibrillation, prior substance abuse seen today for  for Electrophysiology evaluation of atrial fibrillation at the request of Marca Ancona.    She has a long past medical history with vascular disease and heart failure.  She has had multiple admissions to the hospital for both of these.  She has significant stenting in her right leg as well as bypasses in her left leg.  She has had atrial fibrillation which resulted in hospitalization.  She was cardioverted and has maintained sinus rhythm.  She is on medical therapy for her heart failure.  She tells me that she has not had any further episodes of atrial fibrillation since November.  Her current issue is an ulcer on her right heel.  She is working with wound care, and hoping to avoid further amputation.  She has 1 toe amputated from her left foot and partial amputation of her right foot.  Review of systems complete and found to be negative unless listed in HPI.   EP Information / Studies Reviewed:    EKG is ordered today. Personal review as below.  EKG Interpretation Date/Time:  Tuesday December 12 2023 12:04:00 EST Ventricular Rate:  70 PR Interval:  176 QRS Duration:  100 QT Interval:  450 QTC Calculation: 486 R Axis:   14  Text Interpretation: Sinus rhythm with occasional Premature ventricular complexes T wave abnormality, consider inferior ischemia Prolonged QT When compared with ECG of 10-Oct-2023 11:53, Premature ventricular complexes are now Present Confirmed by Tryton Bodi ([id number]) on 12/12/2023 12:13:33 PM     Risk  Assessment/Calculations:    CHA2DS2-VASc Score = 5   This indicates a 7.2% annual risk of stroke. The patient's score is based upon: CHF History: 1 HTN History: 1 Diabetes History: 1 Stroke History: 0 Vascular Disease History: 1 Age Score: 0 Gender Score: 1             Physical Exam:   VS:  BP 120/62 (BP Location: Right Arm, Patient Position: Sitting, Cuff Size: Large)   Pulse 70   Ht 5\' 2"  (1.575 m)   Wt 208 lb (94.3 kg)   LMP  (LMP Unknown)   SpO2 96%   BMI 38.04 kg/m    Wt Readings from Last 3 Encounters:  12/12/23 208 lb (94.3 kg)  12/07/23 211 lb 6.4 oz (95.9 kg)  12/06/23 205 lb (93 kg)     GEN: Well nourished, well developed in no acute distress NECK: No JVD; No carotid bruits CARDIAC: Regular rate and rhythm, no murmurs, rubs, gallops RESPIRATORY:  Clear to auscultation without rales, wheezing or rhonchi  ABDOMEN: Soft, non-tender, non-distended EXTREMITIES:  No edema; No deformity   ASSESSMENT AND PLAN:    1.  Paroxysmal atrial fibrillation: Currently on Eliquis.  Well rate controlled.  At this point, she does not seem to be a good candidate for EP study and ablation.  With her chronic risk factors, and nonhealing ulcer on her foot, would hold off on EP procedures for now.  She would likely best be served from long-term medication therapy for her atrial fibrillation, she would benefit  from either amiodarone or dofetilide.  Auria Mckinlay discuss this further with heart failure physicians.  2.  Chronic systolic heart failure: Volume status is stable.  On medical therapy per heart failure cardiology.  3.  Secondary hypercoagulable state: Currently on Eliquis for atrial fibrillation  Follow up with Dr. Elberta Fortis in 6 months  Signed, Izaak Sahr Jorja Loa, MD

## 2023-12-12 NOTE — Telephone Encounter (Addendum)
Contacted [facility] to request the following for refills:  Eliquis metolazone --too soon--LF 1/8   Kerry Hough, Paramedic [phone] 12/12/23

## 2023-12-13 ENCOUNTER — Encounter (HOSPITAL_BASED_OUTPATIENT_CLINIC_OR_DEPARTMENT_OTHER): Payer: 59 | Admitting: Internal Medicine

## 2023-12-13 DIAGNOSIS — S60512A Abrasion of left hand, initial encounter: Secondary | ICD-10-CM | POA: Diagnosis not present

## 2023-12-13 DIAGNOSIS — I87331 Chronic venous hypertension (idiopathic) with ulcer and inflammation of right lower extremity: Secondary | ICD-10-CM | POA: Diagnosis not present

## 2023-12-13 DIAGNOSIS — E11621 Type 2 diabetes mellitus with foot ulcer: Secondary | ICD-10-CM | POA: Diagnosis not present

## 2023-12-13 DIAGNOSIS — L97512 Non-pressure chronic ulcer of other part of right foot with fat layer exposed: Secondary | ICD-10-CM | POA: Diagnosis not present

## 2023-12-13 DIAGNOSIS — L89312 Pressure ulcer of right buttock, stage 2: Secondary | ICD-10-CM | POA: Diagnosis not present

## 2023-12-14 ENCOUNTER — Ambulatory Visit (HOSPITAL_COMMUNITY)
Admission: RE | Admit: 2023-12-14 | Discharge: 2023-12-14 | Disposition: A | Discharge status: Home / Self Care | Payer: 59 | Source: Ambulatory Visit | Attending: Internal Medicine | Admitting: Internal Medicine

## 2023-12-14 ENCOUNTER — Other Ambulatory Visit (HOSPITAL_COMMUNITY): Payer: Self-pay

## 2023-12-14 ENCOUNTER — Telehealth (HOSPITAL_COMMUNITY): Payer: Self-pay

## 2023-12-14 DIAGNOSIS — I5022 Chronic systolic (congestive) heart failure: Secondary | ICD-10-CM | POA: Insufficient documentation

## 2023-12-14 DIAGNOSIS — N1831 Chronic kidney disease, stage 3a: Secondary | ICD-10-CM | POA: Insufficient documentation

## 2023-12-14 LAB — BASIC METABOLIC PANEL
Anion gap: 14 (ref 5–15)
BUN: 101 mg/dL — ABNORMAL HIGH (ref 8–23)
CO2: 22 mmol/L (ref 22–32)
Calcium: 9 mg/dL (ref 8.9–10.3)
Chloride: 99 mmol/L (ref 98–111)
Creatinine, Ser: 2.44 mg/dL — ABNORMAL HIGH (ref 0.44–1.00)
GFR, Estimated: 22 mL/min — ABNORMAL LOW (ref >60)
Glucose, Bld: 223 mg/dL — ABNORMAL HIGH (ref 70–99)
Potassium: 3.7 mmol/L (ref 3.5–5.1)
Sodium: 135 mmol/L (ref 135–145)

## 2023-12-14 NOTE — Progress Notes (Signed)
Paramedicine Encounter    Patient ID: [name], female    DOB: 1961/01/09, 63 y.o.   MRN: [id number]   Complaints-diarrhea/nausea   Edema-improved   Compliance with meds-yes  Pill box filled-yes  If so, by whom-paramedic   Refills needed   Pt reports feeling ok- she had diarrhea past couple days, some nausea this morning but able to eat and drink ok.   She reports her breathing is doing good.  No dizziness, no c/p.  The wound center has d/c her now that her wound has healed up.  She said [facility] has one more visit with her on Saturday.  Labs done today. Kidney function improved. She can resume metolazone on Monday and continue the torsemide 80mg  BID.  Weights seem to be stable.  Her vascepa has showed back up on her med list, not sure where that came from, but I sent a message to triage asking if she needs to get back on it. It was d/c back in nov-not sure why- and has not been on her list since.   Meds verified and pill box refilled.  I did not add the vascepa back yet until clarification from clinic.    CBG PTA-149  BP 124/60   Pulse 72   Resp 18   Wt 206 lb (93.4 kg)   LMP  (LMP Unknown)   SpO2 98%   BMI 37.68 kg/m  Weight yesterday-207 Last visit weight--205  Patient Care Team: Pcp, No as PCP - General Runell Gess, MD as PCP - Cardiology (Cardiology) Laurey Morale, MD as PCP - Advanced Heart Failure (Cardiology) Regan Lemming, MD as PCP - Electrophysiology (Cardiology) Runell Gess, MD as Consulting Physician (Cardiology) Nyoka Cowden, MD as Consulting Physician (Pulmonary Disease) System, Provider Not In  Patient Active Problem List   Diagnosis Date Noted   Heart failure ([name]) 10/11/2023   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) and diastolic (congestive) heart failure ([name]) 10/10/2023   Pre-ulcerative calluses 04/25/2022   COPD (chronic obstructive pulmonary disease) ([name]) 05/05/2021   OSA (obstructive sleep apnea)  05/05/2021   Stage 3b chronic kidney disease ([name]) 05/05/2021   Pressure injury of skin 05/04/2021   Diabetic nephropathy ([name]) 01/02/2021   Low back pain 06/01/2020   Hyperlipidemia 04/03/2020   PAD (peripheral artery disease) ([name]) 12/26/2019   NICM (nonischemic cardiomyopathy) ([name]) 06/20/2019   Non-healing ulcer ([name]) 06/20/2019   Coagulation disorder ([name]) 08/09/2017   Depression 07/21/2017   At risk for adverse drug reaction 06/20/2017   Peripheral neuropathy 06/20/2017   S/P transmetatarsal amputation of foot, right ([name]) 06/05/2017   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) 05/19/2017   Obesity, class 2 02/24/2016   Encounter for therapeutic drug monitoring 02/10/2016   Symptomatic bradycardia 01/12/2016   Essential hypertension 12/22/2015   Elevated troponin    Acute on chronic diastolic CHF (congestive heart failure) ([name])    Anemia of chronic disease 11/08/2015   Hypokalemia 11/08/2015   Tobacco abuse 10/23/2015   Coronary artery disease    DOE (dyspnea on exertion) 04/29/2015   PAF (paroxysmal atrial fibrillation) ([name]) 01/16/2015   Carotid arterial disease ([name]) 01/16/2015   Insomnia 02/03/2014   S/P peripheral artery angioplasty - TurboHawk atherectomy; R SFA 09/11/2013    Class: Acute   Leg pain, bilateral 08/19/2013   Hypothyroidism 07/31/2013   History of cocaine abuse ([name]) 06/13/2013   Long term current use of anticoagulant therapy 05/20/2013  Alcohol abuse    Narcotic abuse ([name])    Marijuana abuse    Alcoholic cirrhosis ([name])    Type 2 diabetes mellitus with hyperlipidemia ([name])     Current Outpatient Medications:    acetaminophen (TYLENOL) 500 MG tablet, Take 1,000 mg by mouth every 6 (six) hours as needed for headache (pain)., Disp: , Rfl:    albuterol (VENTOLIN HFA) 108 (90 Base) MCG/ACT inhaler, USE 2 PUFFS BY MOUTH EVERY FOUR HOURS, AS NEEDED, FOR COUGHING/WHEEZING, Disp: 8.5 g, Rfl: 0    allopurinol (ZYLOPRIM) 100 MG tablet, Take 150 mg by mouth daily., Disp: , Rfl:    apixaban (ELIQUIS) 5 MG TABS tablet, Take 1 tablet (5 mg total) by mouth 2 (two) times daily., Disp: 60 tablet, Rfl: 6   budesonide-formoterol (SYMBICORT) 160-4.5 MCG/ACT inhaler, Inhale 2 puffs into the lungs 2 (two) times daily., Disp: 1 each, Rfl: 2   buPROPion ER (WELLBUTRIN SR) 100 MG 12 hr tablet, Take 1 tablet (100 mg total) by mouth daily., Disp: 30 tablet, Rfl: 6   Cholecalciferol (VITAMIN D3 PO), Take 1 capsule by mouth every morning., Disp: , Rfl:    colchicine 0.6 MG tablet, Take 1.2 mg by mouth See admin instructions. Take 1.2 mg for flair up and an hour later take 0.6 mg if needed, Disp: , Rfl:    fluticasone (FLONASE) 50 MCG/ACT nasal spray, Place 1 spray into both nostrils as needed for allergies or rhinitis., Disp: , Rfl:    LANTUS SOLOSTAR 100 UNIT/ML Solostar Pen, Inject 37 Units into the skin daily. (Patient taking differently: Inject 40 Units into the skin daily.), Disp: 15 mL, Rfl: 0   levothyroxine (SYNTHROID) 50 MCG tablet, TAKE 1 TABLET (50 MCG TOTAL) BY MOUTH DAILY BEFORE BREAKFAST (AM), Disp: 30 tablet, Rfl: 0   losartan (COZAAR) 25 MG tablet, Take 1 tablet (25 mg total) by mouth daily., Disp: 30 tablet, Rfl: 0   metolazone (ZAROXOLYN) 2.5 MG tablet, Take 1 tablet (2.5 mg total) by mouth once a week. Every Monday, Disp: 15 tablet, Rfl: 3   metoprolol succinate (TOPROL-XL) 25 MG 24 hr tablet, Take 1 tablet (25 mg total) by mouth daily., Disp: 90 tablet, Rfl: 3   pantoprazole (PROTONIX) 40 MG tablet, TAKE 1 TABLET (40 MG TOTAL) BY MOUTH DAILY(AM), Disp: 30 tablet, Rfl: 0   potassium chloride SA (KLOR-CON M) 20 MEQ tablet, Take 2 tablets (40 mEq total) by mouth once a week. With every dose of metolazone, Disp: , Rfl:    rosuvastatin (CRESTOR) 40 MG tablet, TAKE 1 TABLET (40 MG TOTAL) BY MOUTH DAILY (Patient taking differently: Take 40 mg by mouth at bedtime.), Disp: 15 tablet, Rfl: 0    spironolactone (ALDACTONE) 25 MG tablet, Take 1 tablet (25 mg total) by mouth daily., Disp: 90 tablet, Rfl: 3   torsemide 40 MG TABS, Take 80 mg by mouth 2 (two) times daily., Disp: 360 tablet, Rfl: 3   VITAMIN E PO, Take 1 tablet by mouth daily., Disp: , Rfl:    ACCU-CHEK GUIDE test strip, USE TO CHECK BLOOD SUGAR FOUR TIMES DAILY, Disp: , Rfl:    EASY COMFORT PEN NEEDLES 31G X 5 MM MISC, USE FOUR TIMES A DAY FOR INSULIN ADMINISTRATION, Disp: 100 each, Rfl: 1   Furosemide (FUROSCIX [place]), Inject into the skin as directed. As needed (Patient not taking: Reported on 12/14/2023), Disp: , Rfl:    insulin lispro (HUMALOG) 100 UNIT/ML KwikPen, Inject 10 Units into the skin 3 (three) times daily with  meals., Disp: 15 mL, Rfl: 0   Omega-3 Fatty Acids (FISH OIL PO), Take 1 tablet by mouth daily. (Patient not taking: Reported on 12/12/2023), Disp: , Rfl:    polyethylene glycol (MIRALAX / GLYCOLAX) 17 g packet, Take 17 g by mouth daily as needed for moderate constipation., Disp: 14 each, Rfl: 0   triamcinolone ointment (KENALOG) 0.1 %, Apply topically 2 (two) times daily., Disp: 454 g, Rfl: 0   VASCEPA 1 g capsule, Take 2 g by mouth 2 (two) times daily., Disp: , Rfl:  Allergies  Allergen Reactions   Gabapentin Nausea And Vomiting and Other (See Comments)    POSSIBLE SHAKING   Lyrica [Pregabalin] Other (See Comments)    Shaking       Social History   Socioeconomic History   Marital status: Single    Spouse name: Not on file   Number of children: 1   Years of education: 12   Highest education level: 12th grade  Occupational History   Occupation: disabled  Tobacco Use   Smoking status: Every Day    Current packs/day: 1.00    Average packs/day: 1 pack/day for 44.0 years (44.0 ttl pk-yrs)    Types: E-cigarettes, Cigarettes   Smokeless tobacco: Former    Types: Snuff  Vaping Use   Vaping status: Former   Devices: 11/26/2018 "stopped months ago"  Substance and Sexual Activity   Alcohol use: Not  Currently    Comment: occ   Drug use: Not Currently    Types: "Crack" cocaine, Marijuana, Oxycodone   Sexual activity: Not Currently  Other Topics Concern   Not on file  Social History Narrative   ** Merged History Encounter **       Lives in [place], in motel with sister.  They are looking to move but don't have a place to go yet.     Social Drivers of Corporate investment banker Strain: Low Risk  (02/09/2023)   Overall Financial Resource Strain (CARDIA)    Difficulty of Paying Living Expenses: Not hard at all  Food Insecurity: No Food Insecurity (10/10/2023)   Hunger Vital Sign    Worried About Running Out of Food in the Last Year: Never true    Ran Out of Food in the Last Year: Never true  Transportation Needs: No Transportation Needs (10/10/2023)   PRAPARE - Administrator, Civil Service (Medical): No    Lack of Transportation (Non-Medical): No  Physical Activity: Inactive (02/09/2023)   Exercise Vital Sign    Days of Exercise per Week: 0 days    Minutes of Exercise per Session: 0 min  Stress: No Stress Concern Present (02/09/2023)   [employer] of Occupational Health - Occupational Stress Questionnaire    Feeling of Stress : Not at all  Social Connections: Unknown (08/21/2023)   [facility] from [facility]   Social Network    Social Network: Not on file  Intimate Partner Violence: Not At Risk (10/10/2023)   Humiliation, Afraid, Rape, and Kick questionnaire    Fear of Current or Ex-Partner: No    Emotionally Abused: No    Physically Abused: No    Sexually Abused: No    Physical Exam      Future Appointments  Date Time Provider Department Center  01/22/2024 10:00 AM LB ENDO/NEURO LAB [facility] None  01/25/2024  2:00 PM Alt[place], MD [facility] None  02/08/2024 11:30 AM [facility] PA/NP [facility] None  02/13/2024  2:20 PM [facility] ANNUAL WELLNESS VISIT [facility]  None  04/09/2024  3:00 PM [facility] HS VASC 4 [facility] VVS  04/09/2024  4:00 PM Leonie Douglas,  MD [facility]       Kerry Hough, Paramedic [phone] [facility] Paramedic  12/14/23

## 2023-12-14 NOTE — Telephone Encounter (Signed)
[facility] Paramedicine Message to [facility]  Pharmacy (if applicable): [facility]    Issue/reason for call:  rx question     So her vascepa was stopped when she was admitted back in November, and it has not been on her med list since then until it showed up today. So not sure where that came from, does she need to get back on this??   Kerry Hough, EMT-Paramedic  [phone] 12/14/2023

## 2023-12-18 NOTE — Addendum Note (Signed)
Addended by: Fiore Detjen, Milagros Reap on: 12/18/2023 03:58 PM   Modules accepted: Orders

## 2023-12-18 NOTE — Telephone Encounter (Signed)
Med list update and message to para medicine

## 2023-12-21 ENCOUNTER — Other Ambulatory Visit (HOSPITAL_COMMUNITY): Payer: Self-pay

## 2023-12-21 NOTE — Progress Notes (Signed)
 Paramedicine Encounter    Patient ID: [name], female    DOB: 08-Jun-1961, 63 y.o.   MRN: [id number]   Complaints-feet hurt   Edema-slight  Compliance with meds-yes   Pill box filled-yes  If so, by whom-paramedic   Refills needed- Wellbutrin  Insulin  --ordered both of these    Pt reports she is doing ok. She has her chronic foot pain.  She did go out to eat Tuesday night and had steak-she ate half that night and the other half next day.  Weight is up 8 lbs from last week BUT she was down last week due to her several days of diarrhea. Her weights at home usually run between 205-210. So she is still up a few lbs. She denies increased sob, no dizziness, no c/p. Mild edema. She does not feel bloated. Able to sleep ok.  Clinic staff are gone for the day already.  She is going to limit her fluids and watch what she eats and continue to keep check on her weights.  Advised her to call clinic if anything gets worse. I will send message to clinic just as FYI in case there are any changes needed.   Meds verified and pill box refilled.   Over 200  BP 130/80   Pulse (!) 58   Resp 18   Wt 214 lb (97.1 kg)   LMP  (LMP Unknown)   SpO2 94%   BMI 39.14 kg/m  Weight yesterday-214 Last visit weight-206   Patient Care Team: Pcp, No as PCP - General Court Dorn PARAS, MD as PCP - Cardiology (Cardiology) Rolan Ezra GORMAN, MD as PCP - Advanced Heart Failure (Cardiology) Inocencio Soyla Lunger, MD as PCP - Electrophysiology (Cardiology) Court Dorn PARAS, MD as Consulting Physician (Cardiology) Darlean Ozell NOVAK, MD as Consulting Physician (Pulmonary Disease) System, Provider Not In  Patient Active Problem List   Diagnosis Date Noted   Heart failure ([name]) 10/11/2023   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) and diastolic (congestive) heart failure ([name]) 10/10/2023   Pre-ulcerative calluses 04/25/2022   COPD (chronic obstructive pulmonary disease) ([name]) 05/05/2021   OSA  (obstructive sleep apnea) 05/05/2021   Stage 3b chronic kidney disease ([name]) 05/05/2021   Pressure injury of skin 05/04/2021   Diabetic nephropathy ([name]) 01/02/2021   Low back pain 06/01/2020   Hyperlipidemia 04/03/2020   PAD (peripheral artery disease) ([name]) 12/26/2019   NICM (nonischemic cardiomyopathy) ([name]) 06/20/2019   Non-healing ulcer ([name]) 06/20/2019   Coagulation disorder ([name]) 08/09/2017   Depression 07/21/2017   At risk for adverse drug reaction 06/20/2017   Peripheral neuropathy 06/20/2017   S/P transmetatarsal amputation of foot, right ([name]) 06/05/2017   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) 05/19/2017   Obesity, class 2 02/24/2016   Encounter for therapeutic drug monitoring 02/10/2016   Symptomatic bradycardia 01/12/2016   Essential hypertension 12/22/2015   Elevated troponin    Acute on chronic diastolic CHF (congestive heart failure) ([name])    Anemia of chronic disease 11/08/2015   Hypokalemia 11/08/2015   Tobacco abuse 10/23/2015   Coronary artery disease    DOE (dyspnea on exertion) 04/29/2015   PAF (paroxysmal atrial fibrillation) ([name]) 01/16/2015   Carotid arterial disease ([name]) 01/16/2015   Insomnia 02/03/2014   S/P peripheral artery angioplasty - TurboHawk atherectomy; R SFA 09/11/2013    Class: Acute   Leg pain, bilateral 08/19/2013   Hypothyroidism 07/31/2013   History of cocaine abuse ([name]) 06/13/2013   Long term  current use of anticoagulant therapy 05/20/2013   Alcohol abuse    Narcotic abuse ([name])    Marijuana abuse    Alcoholic cirrhosis ([name])    Type 2 diabetes mellitus with hyperlipidemia ([name])     Current Outpatient Medications:    ACCU-CHEK GUIDE test strip, USE TO CHECK BLOOD SUGAR FOUR TIMES DAILY, Disp: , Rfl:    acetaminophen  (TYLENOL ) 500 MG tablet, Take 1,000 mg by mouth every 6 (six) hours as needed for headache (pain)., Disp: , Rfl:    albuterol  (VENTOLIN  HFA) 108 (90 Base)  MCG/ACT inhaler, USE 2 PUFFS BY MOUTH EVERY FOUR HOURS, AS NEEDED, FOR COUGHING/WHEEZING, Disp: 8.5 g, Rfl: 0   allopurinol  (ZYLOPRIM ) 100 MG tablet, Take 150 mg by mouth daily., Disp: , Rfl:    apixaban  (ELIQUIS ) 5 MG TABS tablet, Take 1 tablet (5 mg total) by mouth 2 (two) times daily., Disp: 60 tablet, Rfl: 6   budesonide -formoterol  (SYMBICORT ) 160-4.5 MCG/ACT inhaler, Inhale 2 puffs into the lungs 2 (two) times daily., Disp: 1 each, Rfl: 2   buPROPion  ER (WELLBUTRIN  SR) 100 MG 12 hr tablet, Take 1 tablet (100 mg total) by mouth daily., Disp: 30 tablet, Rfl: 6   Cholecalciferol  (VITAMIN D3 PO), Take 1 capsule by mouth every morning., Disp: , Rfl:    colchicine  0.6 MG tablet, Take 1.2 mg by mouth See admin instructions. Take 1.2 mg for flair up and an hour later take 0.6 mg if needed, Disp: , Rfl:    insulin  lispro (HUMALOG ) 100 UNIT/ML KwikPen, Inject 10 Units into the skin 3 (three) times daily with meals., Disp: 15 mL, Rfl: 0   LANTUS  SOLOSTAR 100 UNIT/ML Solostar Pen, Inject 37 Units into the skin daily. (Patient taking differently: Inject 40 Units into the skin daily.), Disp: 15 mL, Rfl: 0   levothyroxine  (SYNTHROID ) 50 MCG tablet, TAKE 1 TABLET (50 MCG TOTAL) BY MOUTH DAILY BEFORE BREAKFAST (AM), Disp: 30 tablet, Rfl: 0   losartan  (COZAAR ) 25 MG tablet, Take 1 tablet (25 mg total) by mouth daily., Disp: 30 tablet, Rfl: 0   metolazone  (ZAROXOLYN ) 2.5 MG tablet, Take 1 tablet (2.5 mg total) by mouth once a week. Every Monday, Disp: 15 tablet, Rfl: 3   metoprolol  succinate (TOPROL -XL) 25 MG 24 hr tablet, Take 1 tablet (25 mg total) by mouth daily., Disp: 90 tablet, Rfl: 3   pantoprazole  (PROTONIX ) 40 MG tablet, TAKE 1 TABLET (40 MG TOTAL) BY MOUTH DAILY(AM), Disp: 30 tablet, Rfl: 0   potassium chloride  SA (KLOR-CON  M) 20 MEQ tablet, Take 2 tablets (40 mEq total) by mouth once a week. With every dose of metolazone , Disp: , Rfl:    rosuvastatin  (CRESTOR ) 40 MG tablet, TAKE 1 TABLET (40 MG TOTAL) BY  MOUTH DAILY (Patient taking differently: Take 40 mg by mouth at bedtime.), Disp: 15 tablet, Rfl: 0   spironolactone  (ALDACTONE ) 25 MG tablet, Take 1 tablet (25 mg total) by mouth daily., Disp: 90 tablet, Rfl: 3   torsemide  40 MG TABS, Take 80 mg by mouth 2 (two) times daily., Disp: 360 tablet, Rfl: 3   triamcinolone  ointment (KENALOG ) 0.1 %, Apply topically 2 (two) times daily., Disp: 454 g, Rfl: 0   VITAMIN E PO, Take 1 tablet by mouth daily., Disp: , Rfl:    EASY COMFORT PEN NEEDLES 31G X 5 MM MISC, USE FOUR TIMES A DAY FOR INSULIN  ADMINISTRATION, Disp: 100 each, Rfl: 1   fluticasone  (FLONASE ) 50 MCG/ACT nasal spray, Place 1 spray into both nostrils as  needed for allergies or rhinitis., Disp: , Rfl:    Furosemide  (FUROSCIX  [place]), Inject into the skin as directed. As needed (Patient not taking: Reported on 12/14/2023), Disp: , Rfl:    Omega-3 Fatty Acids (FISH OIL PO), Take 1 tablet by mouth daily. (Patient not taking: Reported on 12/12/2023), Disp: , Rfl:    polyethylene glycol (MIRALAX  / GLYCOLAX ) 17 g packet, Take 17 g by mouth daily as needed for moderate constipation., Disp: 14 each, Rfl: 0 Allergies  Allergen Reactions   Gabapentin  Nausea And Vomiting and Other (See Comments)    POSSIBLE SHAKING   Lyrica  [Pregabalin ] Other (See Comments)    Shaking       Social History   Socioeconomic History   Marital status: Single    Spouse name: Not on file   Number of children: 1   Years of education: 12   Highest education level: 12th grade  Occupational History   Occupation: disabled  Tobacco Use   Smoking status: Every Day    Current packs/day: 1.00    Average packs/day: 1 pack/day for 44.0 years (44.0 ttl pk-yrs)    Types: E-cigarettes, Cigarettes   Smokeless tobacco: Former    Types: Snuff  Vaping Use   Vaping status: Former   Devices: 11/26/2018 stopped months ago  Substance and Sexual Activity   Alcohol use: Not Currently    Comment: occ   Drug use: Not Currently    Types:  Crack cocaine, Marijuana, Oxycodone    Sexual activity: Not Currently  Other Topics Concern   Not on file  Social History Narrative   ** Merged History Encounter **       Lives in [place], in motel with sister.  They are looking to move but don't have a place to go yet.     Social Drivers of Corporate Investment Banker Strain: Low Risk  (02/09/2023)   Overall Financial Resource Strain (CARDIA)    Difficulty of Paying Living Expenses: Not hard at all  Food Insecurity: No Food Insecurity (10/10/2023)   Hunger Vital Sign    Worried About Running Out of Food in the Last Year: Never true    Ran Out of Food in the Last Year: Never true  Transportation Needs: No Transportation Needs (10/10/2023)   PRAPARE - Administrator, Civil Service (Medical): No    Lack of Transportation (Non-Medical): No  Physical Activity: Inactive (02/09/2023)   Exercise Vital Sign    Days of Exercise per Week: 0 days    Minutes of Exercise per Session: 0 min  Stress: No Stress Concern Present (02/09/2023)   [employer] of Occupational Health - Occupational Stress Questionnaire    Feeling of Stress : Not at all  Social Connections: Unknown (08/21/2023)   [facility]   Social Network    Social Network: Not on file  Intimate Partner Violence: Not At Risk (10/10/2023)   Humiliation, Afraid, Rape, and Kick questionnaire    Fear of Current or Ex-Partner: No    Emotionally Abused: No    Physically Abused: No    Sexually Abused: No    Physical Exam      Future Appointments  Date Time Provider Department Center  01/22/2024 10:00 AM LB ENDO/NEURO LAB [facility] None  01/25/2024  2:00 PM Dartha Ernst, MD [facility] None  02/08/2024 11:30 AM [facility] PA/NP [facility] None  02/13/2024  2:20 PM [facility] ANNUAL WELLNESS VISIT [facility] None  04/09/2024  3:00 PM [facility] HS VASC 4 [facility]  VVS  04/09/2024  4:00 PM Magda Debby SAILOR, MD [facility]       [name],  Paramedic [phone] [facility] Paramedic  12/21/23

## 2023-12-22 ENCOUNTER — Other Ambulatory Visit (HOSPITAL_COMMUNITY): Payer: Self-pay | Admitting: Emergency Medicine

## 2023-12-22 ENCOUNTER — Telehealth (HOSPITAL_COMMUNITY): Payer: Self-pay | Admitting: Cardiology

## 2023-12-22 NOTE — Telephone Encounter (Signed)
 Cardiomems Goal 20 Reading       2/7 25

## 2023-12-22 NOTE — Telephone Encounter (Signed)
-----   Message from [facility] [name] L sent at 12/21/2023  5:51 PM EST ----- Regarding: weight HF Paramedicine Message to [facility]  [facility]    Issue/reason for call: increased weight     Blood pressure?  130/80   Weight gain? (How much over a week?) 4 lbs   Edema ? (Baseline or worse?) mild   Dyspnea?  (Baseline or worse?)  none    Medications are taken as prescribed? Yes   Her home weights range between 205-210.  Last week she was down to 206 after several days of diarrhea.  Past 2 days she is 214 lbs.  She did have a steak from steakhouse for 2 days in a row  (1/2 of steak for 2 days) so I'm sure that is to blame. But she denies any complaints. Mild edema to legs.  Taking all of her meds.   Reminder I am not here Friday if there are any changes needed. I would not relay changes to her, she may get confused b/c she has 2 different strength bottles of torsemide  and also I wouldn't want her to confuse the metoprolol  vs metolazone  depending on what is needed.  [name] should be in if anything is needed, she could probably swing by to make the change.   [name], [name]  [phone] 12/21/2023

## 2023-12-22 NOTE — Telephone Encounter (Signed)
 [name] with para medicine aware -will assist with med change

## 2023-12-22 NOTE — Progress Notes (Signed)
 Unscheduled home visit to facilitate med orders due to excess fluid.  Assisted Ms. Bordley w/ 2.5mg . Metolazone  and 40 mEQ Potassium. Denies chest pain or SOB.  Lung sounds with mild inspiratory  and expiratory wheezes.  She has used her inhaler today. Edema noted to her lower extremities up to the knees.    Mary Sharps, EMT-Paramedic [phone] 12/22/2023

## 2023-12-25 ENCOUNTER — Telehealth (HOSPITAL_COMMUNITY): Payer: Self-pay

## 2023-12-26 ENCOUNTER — Telehealth (HOSPITAL_COMMUNITY): Payer: Self-pay

## 2023-12-26 NOTE — Telephone Encounter (Signed)
Reached out to pt to f/u from med changes last week.  No answer or call back.   Kerry Hough, EMT-Paramedic  [phone] 12/25/2023

## 2023-12-26 NOTE — Telephone Encounter (Signed)
Reached out to pt to f/u on med changes from last week.  She did not answer again.   I did take a peek at her cardiomems and it was down from the past few days.  It looks like she is going in the right direction.   Will continue to try to reach her and am due to see her Thursday.   Kerry Hough, EMT-Paramedic  [phone] 12/26/2023

## 2023-12-27 ENCOUNTER — Telehealth: Payer: Self-pay

## 2023-12-27 ENCOUNTER — Other Ambulatory Visit (HOSPITAL_COMMUNITY): Payer: Self-pay

## 2023-12-27 NOTE — Progress Notes (Signed)
Paramedicine Encounter    Patient ID: [name], female    DOB: Jul 20, 1961, 63 y.o.   MRN: [id number]   Complaints-leg pain, hand cramps   Edema-chronic -more to rt side   Compliance with meds-for most part-she missed yesterdays PM dose of torsemide   Pill box filled-yes  If so, by whom-paramedic   Refills needed- Allopurinol  Metolazone--needs in mon am slot--placed  Wellbutrin-needs in pill box -sat am to tues am  --placed  Levothyroxine  Torsemide    Her wellbutrin and insulin was not delivered-turns out it is ready just needs to be p/u   Arrived a day early for f/u post med changes from Friday due to weight gain.  Her weight is back down, her cardiomems is back to normal also.   Lungs are coarse wheezy but she has not used inhaler yet this morning.   She denies increased sob, no dizziness, no c/p.  Just chronic pains to legs/feet and hands.  She has chronic edema to the rt leg. Edema looks good on the left side.  Meds verified and pill box refilled.  I did go to pharmacy to p/u meds and placed in pil box where needed.  Will f/u next week.    BP 118/60   Pulse 91   Resp 18   Wt 210 lb (95.3 kg)   LMP  (LMP Unknown)   SpO2 95%   BMI 38.41 kg/m  Weight yesterday--210 Last visit weight-214   Patient Care Team: Pcp, No as PCP - General Runell Gess, MD as PCP - Cardiology (Cardiology) Laurey Morale, MD as PCP - Advanced Heart Failure (Cardiology) Regan Lemming, MD as PCP - Electrophysiology (Cardiology) Runell Gess, MD as Consulting Physician (Cardiology) Nyoka Cowden, MD as Consulting Physician (Pulmonary Disease) System, Provider Not In  Patient Active Problem List   Diagnosis Date Noted   Heart failure ([name]) 10/11/2023   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) and diastolic (congestive) heart failure ([name]) 10/10/2023   Pre-ulcerative calluses 04/25/2022   COPD (chronic obstructive pulmonary disease) ([name]) 05/05/2021    OSA (obstructive sleep apnea) 05/05/2021   Stage 3b chronic kidney disease ([name]) 05/05/2021   Pressure injury of skin 05/04/2021   Diabetic nephropathy ([name]) 01/02/2021   Low back pain 06/01/2020   Hyperlipidemia 04/03/2020   PAD (peripheral artery disease) ([name]) 12/26/2019   NICM (nonischemic cardiomyopathy) ([name]) 06/20/2019   Non-healing ulcer ([name]) 06/20/2019   Coagulation disorder ([name]) 08/09/2017   Depression 07/21/2017   At risk for adverse drug reaction 06/20/2017   Peripheral neuropathy 06/20/2017   S/P transmetatarsal amputation of foot, right ([name]) 06/05/2017   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) 05/19/2017   Obesity, class 2 02/24/2016   Encounter for therapeutic drug monitoring 02/10/2016   Symptomatic bradycardia 01/12/2016   Essential hypertension 12/22/2015   Elevated troponin    Acute on chronic diastolic CHF (congestive heart failure) ([name])    Anemia of chronic disease 11/08/2015   Hypokalemia 11/08/2015   Tobacco abuse 10/23/2015   Coronary artery disease    DOE (dyspnea on exertion) 04/29/2015   PAF (paroxysmal atrial fibrillation) ([name]) 01/16/2015   Carotid arterial disease ([name]) 01/16/2015   Insomnia 02/03/2014   S/P peripheral artery angioplasty - TurboHawk atherectomy; R SFA 09/11/2013    Class: Acute   Leg pain, bilateral 08/19/2013   Hypothyroidism 07/31/2013   History of cocaine abuse ([name]) 06/13/2013   Long term current use of  anticoagulant therapy 05/20/2013   Alcohol abuse    Narcotic abuse ([name])    Marijuana abuse    Alcoholic cirrhosis ([name])    Type 2 diabetes mellitus with hyperlipidemia ([name])     Current Outpatient Medications:    acetaminophen (TYLENOL) 500 MG tablet, Take 1,000 mg by mouth every 6 (six) hours as needed for headache (pain)., Disp: , Rfl:    albuterol (VENTOLIN HFA) 108 (90 Base) MCG/ACT inhaler, USE 2 PUFFS BY MOUTH EVERY FOUR HOURS, AS NEEDED, FOR  COUGHING/WHEEZING, Disp: 8.5 g, Rfl: 0   allopurinol (ZYLOPRIM) 100 MG tablet, Take 150 mg by mouth daily., Disp: , Rfl:    apixaban (ELIQUIS) 5 MG TABS tablet, Take 1 tablet (5 mg total) by mouth 2 (two) times daily., Disp: 60 tablet, Rfl: 6   budesonide-formoterol (SYMBICORT) 160-4.5 MCG/ACT inhaler, Inhale 2 puffs into the lungs 2 (two) times daily., Disp: 1 each, Rfl: 2   buPROPion ER (WELLBUTRIN SR) 100 MG 12 hr tablet, Take 1 tablet (100 mg total) by mouth daily., Disp: 30 tablet, Rfl: 6   Cholecalciferol (VITAMIN D3 PO), Take 1 capsule by mouth every morning., Disp: , Rfl:    colchicine 0.6 MG tablet, Take 1.2 mg by mouth See admin instructions. Take 1.2 mg for flair up and an hour later take 0.6 mg if needed, Disp: , Rfl:    insulin lispro (HUMALOG) 100 UNIT/ML KwikPen, Inject 10 Units into the skin 3 (three) times daily with meals., Disp: 15 mL, Rfl: 0   LANTUS SOLOSTAR 100 UNIT/ML Solostar Pen, Inject 37 Units into the skin daily. (Patient taking differently: Inject 40 Units into the skin daily.), Disp: 15 mL, Rfl: 0   levothyroxine (SYNTHROID) 50 MCG tablet, TAKE 1 TABLET (50 MCG TOTAL) BY MOUTH DAILY BEFORE BREAKFAST (AM), Disp: 30 tablet, Rfl: 0   losartan (COZAAR) 25 MG tablet, Take 1 tablet (25 mg total) by mouth daily., Disp: 30 tablet, Rfl: 0   metolazone (ZAROXOLYN) 2.5 MG tablet, Take 1 tablet (2.5 mg total) by mouth once a week. Every Monday, Disp: 15 tablet, Rfl: 3   metoprolol succinate (TOPROL-XL) 25 MG 24 hr tablet, Take 1 tablet (25 mg total) by mouth daily., Disp: 90 tablet, Rfl: 3   pantoprazole (PROTONIX) 40 MG tablet, TAKE 1 TABLET (40 MG TOTAL) BY MOUTH DAILY(AM), Disp: 30 tablet, Rfl: 0   potassium chloride SA (KLOR-CON M) 20 MEQ tablet, Take 2 tablets (40 mEq total) by mouth once a week. With every dose of metolazone, Disp: , Rfl:    rosuvastatin (CRESTOR) 40 MG tablet, TAKE 1 TABLET (40 MG TOTAL) BY MOUTH DAILY (Patient taking differently: Take 40 mg by mouth at  bedtime.), Disp: 15 tablet, Rfl: 0   spironolactone (ALDACTONE) 25 MG tablet, Take 1 tablet (25 mg total) by mouth daily., Disp: 90 tablet, Rfl: 3   torsemide 40 MG TABS, Take 80 mg by mouth 2 (two) times daily., Disp: 360 tablet, Rfl: 3   VITAMIN E PO, Take 1 tablet by mouth daily., Disp: , Rfl:    ACCU-CHEK GUIDE test strip, USE TO CHECK BLOOD SUGAR FOUR TIMES DAILY, Disp: , Rfl:    EASY COMFORT PEN NEEDLES 31G X 5 MM MISC, USE FOUR TIMES A DAY FOR INSULIN ADMINISTRATION, Disp: 100 each, Rfl: 1   fluticasone (FLONASE) 50 MCG/ACT nasal spray, Place 1 spray into both nostrils as needed for allergies or rhinitis., Disp: , Rfl:    Furosemide (FUROSCIX [place]), Inject into the skin as directed. As  needed (Patient not taking: Reported on 12/14/2023), Disp: , Rfl:    Omega-3 Fatty Acids (FISH OIL PO), Take 1 tablet by mouth daily. (Patient not taking: Reported on 12/12/2023), Disp: , Rfl:    polyethylene glycol (MIRALAX / GLYCOLAX) 17 g packet, Take 17 g by mouth daily as needed for moderate constipation., Disp: 14 each, Rfl: 0   triamcinolone ointment (KENALOG) 0.1 %, Apply topically 2 (two) times daily., Disp: 454 g, Rfl: 0 Allergies  Allergen Reactions   Gabapentin Nausea And Vomiting and Other (See Comments)    POSSIBLE SHAKING   Lyrica [Pregabalin] Other (See Comments)    Shaking       Social History   Socioeconomic History   Marital status: Single    Spouse name: Not on file   Number of children: 1   Years of education: 12   Highest education level: 12th grade  Occupational History   Occupation: disabled  Tobacco Use   Smoking status: Every Day    Current packs/day: 1.00    Average packs/day: 1 pack/day for 44.0 years (44.0 ttl pk-yrs)    Types: E-cigarettes, Cigarettes   Smokeless tobacco: Former    Types: Snuff  Vaping Use   Vaping status: Former   Devices: 11/26/2018 "stopped months ago"  Substance and Sexual Activity   Alcohol use: Not Currently    Comment: occ   Drug use:  Not Currently    Types: "Crack" cocaine, Marijuana, Oxycodone   Sexual activity: Not Currently  Other Topics Concern   Not on file  Social History Narrative   ** Merged History Encounter **       Lives in [place], in motel with sister.  They are looking to move but don't have a place to go yet.     Social Drivers of Corporate investment banker Strain: Low Risk  (02/09/2023)   Overall Financial Resource Strain (CARDIA)    Difficulty of Paying Living Expenses: Not hard at all  Food Insecurity: No Food Insecurity (10/10/2023)   Hunger Vital Sign    Worried About Running Out of Food in the Last Year: Never true    Ran Out of Food in the Last Year: Never true  Transportation Needs: No Transportation Needs (10/10/2023)   PRAPARE - Administrator, Civil Service (Medical): No    Lack of Transportation (Non-Medical): No  Physical Activity: Inactive (02/09/2023)   Exercise Vital Sign    Days of Exercise per Week: 0 days    Minutes of Exercise per Session: 0 min  Stress: No Stress Concern Present (02/09/2023)   [employer] of Occupational Health - Occupational Stress Questionnaire    Feeling of Stress : Not at all  Social Connections: Unknown (08/21/2023)   Received from [facility]   Social Network    Social Network: Not on file  Intimate Partner Violence: Not At Risk (10/10/2023)   Humiliation, Afraid, Rape, and Kick questionnaire    Fear of Current or Ex-Partner: No    Emotionally Abused: No    Physically Abused: No    Sexually Abused: No    Physical Exam      Future Appointments  Date Time Provider Department Center  01/22/2024 10:00 AM LB ENDO/NEURO LAB [facility] None  01/25/2024  2:00 PM Altamese Maplewood, MD [facility] None  02/08/2024 11:30 AM [facility] PA/NP [facility] None  02/13/2024  2:20 PM [facility] ANNUAL WELLNESS VISIT [facility] None  04/09/2024  3:00 PM [facility] HS VASC 4 [facility]  04/09/2024  4:00 PM Leonie Douglas, MD [facility]       Kerry Hough,  Paramedic [phone] [facility] Paramedic  12/27/23

## 2023-12-27 NOTE — Telephone Encounter (Signed)
No contact information given for [name] from [employer]. Please advised  [name] from [facility]  Patient will need an appointment before any thing can be sent over to them.

## 2023-12-27 NOTE — Telephone Encounter (Signed)
Copied from CRM [id number]. Topic: General - Other >> Dec 26, 2023  3:06 PM Yolanda T wrote: Reason for CRM: Rochelled from [employer] called stated they need office notes for most recent visit that says patient is using a glucose monitor, diagnosis and treatment info. The date of service 10/10/22 that was recvd is more than old and is not acceptable. Please f/u with [facility]

## 2024-01-03 ENCOUNTER — Other Ambulatory Visit: Payer: Self-pay

## 2024-01-03 ENCOUNTER — Other Ambulatory Visit (HOSPITAL_COMMUNITY): Payer: Self-pay

## 2024-01-03 ENCOUNTER — Encounter (HOSPITAL_COMMUNITY): Payer: Self-pay | Admitting: Emergency Medicine

## 2024-01-03 ENCOUNTER — Emergency Department (HOSPITAL_COMMUNITY)
Admission: EM | Admit: 2024-01-03 | Discharge: 2024-01-03 | Disposition: A | Discharge status: Home / Self Care | Payer: 59 | Attending: Emergency Medicine | Admitting: Emergency Medicine

## 2024-01-03 ENCOUNTER — Emergency Department (HOSPITAL_COMMUNITY): Payer: 59

## 2024-01-03 DIAGNOSIS — R0602 Shortness of breath: Secondary | ICD-10-CM | POA: Insufficient documentation

## 2024-01-03 DIAGNOSIS — R410 Disorientation, unspecified: Secondary | ICD-10-CM | POA: Diagnosis present

## 2024-01-03 DIAGNOSIS — E1165 Type 2 diabetes mellitus with hyperglycemia: Secondary | ICD-10-CM | POA: Insufficient documentation

## 2024-01-03 DIAGNOSIS — R109 Unspecified abdominal pain: Secondary | ICD-10-CM | POA: Insufficient documentation

## 2024-01-03 DIAGNOSIS — I700 Atherosclerosis of aorta: Secondary | ICD-10-CM | POA: Insufficient documentation

## 2024-01-03 DIAGNOSIS — R059 Cough, unspecified: Secondary | ICD-10-CM | POA: Diagnosis not present

## 2024-01-03 DIAGNOSIS — R739 Hyperglycemia, unspecified: Secondary | ICD-10-CM

## 2024-01-03 DIAGNOSIS — I509 Heart failure, unspecified: Secondary | ICD-10-CM | POA: Insufficient documentation

## 2024-01-03 DIAGNOSIS — R4182 Altered mental status, unspecified: Secondary | ICD-10-CM | POA: Insufficient documentation

## 2024-01-03 DIAGNOSIS — Z794 Long term (current) use of insulin: Secondary | ICD-10-CM | POA: Diagnosis not present

## 2024-01-03 DIAGNOSIS — J449 Chronic obstructive pulmonary disease, unspecified: Secondary | ICD-10-CM | POA: Insufficient documentation

## 2024-01-03 DIAGNOSIS — R0981 Nasal congestion: Secondary | ICD-10-CM | POA: Insufficient documentation

## 2024-01-03 LAB — I-STAT VENOUS BLOOD GAS, ED
Acid-Base Excess: 9 mmol/L — ABNORMAL HIGH (ref 0.0–2.0)
Bicarbonate: 35.3 mmol/L — ABNORMAL HIGH (ref 20.0–28.0)
Calcium, Ion: 1.12 mmol/L — ABNORMAL LOW (ref 1.15–1.40)
HCT: 34 % — ABNORMAL LOW (ref 36.0–46.0)
Hemoglobin: 11.6 g/dL — ABNORMAL LOW (ref 12.0–15.0)
O2 Saturation: 97 %
Potassium: 3.8 mmol/L (ref 3.5–5.1)
Sodium: 131 mmol/L — ABNORMAL LOW (ref 135–145)
TCO2: 37 mmol/L — ABNORMAL HIGH (ref 22–32)
pCO2, Ven: 52.9 mm[Hg] (ref 44–60)
pH, Ven: 7.432 — ABNORMAL HIGH (ref 7.25–7.43)
pO2, Ven: 87 mm[Hg] — ABNORMAL HIGH (ref 32–45)

## 2024-01-03 LAB — CBC WITH DIFFERENTIAL/PLATELET
Abs Immature Granulocytes: 0.1 10*3/uL — ABNORMAL HIGH (ref 0.00–0.07)
Basophils Absolute: 0 10*3/uL (ref 0.0–0.1)
Basophils Relative: 0 %
Eosinophils Absolute: 0.2 10*3/uL (ref 0.0–0.5)
Eosinophils Relative: 2 %
HCT: 33.5 % — ABNORMAL LOW (ref 36.0–46.0)
Hemoglobin: 10.9 g/dL — ABNORMAL LOW (ref 12.0–15.0)
Immature Granulocytes: 1 %
Lymphocytes Relative: 20 %
Lymphs Abs: 2.4 10*3/uL (ref 0.7–4.0)
MCH: 28.6 pg (ref 26.0–34.0)
MCHC: 32.5 g/dL (ref 30.0–36.0)
MCV: 87.9 fL (ref 80.0–100.0)
Monocytes Absolute: 0.7 10*3/uL (ref 0.1–1.0)
Monocytes Relative: 6 %
Neutro Abs: 8.5 10*3/uL — ABNORMAL HIGH (ref 1.7–7.7)
Neutrophils Relative %: 71 %
Platelets: 236 10*3/uL (ref 150–400)
RBC: 3.81 MIL/uL — ABNORMAL LOW (ref 3.87–5.11)
RDW: 15.6 % — ABNORMAL HIGH (ref 11.5–15.5)
WBC: 12 10*3/uL — ABNORMAL HIGH (ref 4.0–10.5)
nRBC: 0 % (ref 0.0–0.2)

## 2024-01-03 LAB — URINALYSIS, ROUTINE W REFLEX MICROSCOPIC
Bilirubin Urine: NEGATIVE
Glucose, UA: >=500 mg/dL — AB
Hgb urine dipstick: NEGATIVE
Ketones, ur: NEGATIVE mg/dL
Leukocytes,Ua: NEGATIVE
Nitrite: NEGATIVE
Protein, ur: NEGATIVE mg/dL
Specific Gravity, Urine: 1.009 (ref 1.005–1.030)
pH: 5 (ref 5.0–8.0)

## 2024-01-03 LAB — COMPREHENSIVE METABOLIC PANEL
ALT: 15 U/L (ref 0–44)
AST: 21 U/L (ref 15–41)
Albumin: 3 g/dL — ABNORMAL LOW (ref 3.5–5.0)
Alkaline Phosphatase: 112 U/L (ref 38–126)
Anion gap: 15 (ref 5–15)
BUN: 95 mg/dL — ABNORMAL HIGH (ref 8–23)
CO2: 27 mmol/L (ref 22–32)
Calcium: 9.3 mg/dL (ref 8.9–10.3)
Chloride: 87 mmol/L — ABNORMAL LOW (ref 98–111)
Creatinine, Ser: 2.18 mg/dL — ABNORMAL HIGH (ref 0.44–1.00)
GFR, Estimated: 25 mL/min — ABNORMAL LOW (ref >60)
Glucose, Bld: 540 mg/dL (ref 70–99)
Potassium: 4 mmol/L (ref 3.5–5.1)
Sodium: 129 mmol/L — ABNORMAL LOW (ref 135–145)
Total Bilirubin: 0.4 mg/dL (ref 0.0–1.2)
Total Protein: 6.9 g/dL (ref 6.5–8.1)

## 2024-01-03 LAB — I-STAT CHEM 8, ED
BUN: 103 mg/dL — ABNORMAL HIGH (ref 8–23)
Calcium, Ion: 1.11 mmol/L — ABNORMAL LOW (ref 1.15–1.40)
Chloride: 89 mmol/L — ABNORMAL LOW (ref 98–111)
Creatinine, Ser: 2.4 mg/dL — ABNORMAL HIGH (ref 0.44–1.00)
Glucose, Bld: 551 mg/dL (ref 70–99)
HCT: 33 % — ABNORMAL LOW (ref 36.0–46.0)
Hemoglobin: 11.2 g/dL — ABNORMAL LOW (ref 12.0–15.0)
Potassium: 3.8 mmol/L (ref 3.5–5.1)
Sodium: 131 mmol/L — ABNORMAL LOW (ref 135–145)
TCO2: 34 mmol/L — ABNORMAL HIGH (ref 22–32)

## 2024-01-03 LAB — RESP PANEL BY RT-PCR (RSV, FLU A&B, COVID)  RVPGX2
Influenza A by PCR: NEGATIVE
Influenza B by PCR: NEGATIVE
Resp Syncytial Virus by PCR: NEGATIVE
SARS Coronavirus 2 by RT PCR: NEGATIVE

## 2024-01-03 LAB — BETA-HYDROXYBUTYRIC ACID: Beta-Hydroxybutyric Acid: 0.14 mmol/L (ref 0.05–0.27)

## 2024-01-03 LAB — BRAIN NATRIURETIC PEPTIDE: B Natriuretic Peptide: 139.2 pg/mL — ABNORMAL HIGH (ref 0.0–100.0)

## 2024-01-03 LAB — CBG MONITORING, ED
Glucose-Capillary: 540 mg/dL (ref 70–99)
Glucose-Capillary: 447 mg/dL — ABNORMAL HIGH (ref 70–99)
Glucose-Capillary: 359 mg/dL — ABNORMAL HIGH (ref 70–99)

## 2024-01-03 LAB — TROPONIN I (HIGH SENSITIVITY): Troponin I (High Sensitivity): 20 ng/L — ABNORMAL HIGH (ref <18)

## 2024-01-03 MED ORDER — INSULIN ASPART 100 UNIT/ML IJ SOLN
10.0000 [IU] | Freq: Once | INTRAMUSCULAR | Status: AC
  Administered 2024-01-03: 10 [IU] via SUBCUTANEOUS

## 2024-01-03 MED ORDER — LACTATED RINGERS IV BOLUS
1000.0000 mL | Freq: Once | INTRAVENOUS | Status: AC
  Administered 2024-01-03: 1000 mL via INTRAVENOUS

## 2024-01-03 NOTE — Progress Notes (Signed)
Paramedicine Encounter    Patient ID: [name], female    DOB: [birth year], 62 y.o.   MRN: [id number]   Complaints-n/v/d X 2 days   Edema-no abnormal edema   Compliance with meds-yes  Pill box filled-yes  If so, by whom-paramedic   Refills needed-  Eliquis  Rosuvastatin  Allopurinol--needs in mon-wed am dose  Levothyroxine-needs in tues and wed am dose Torsemide- needs in mon tues and wed am and sun mon and tues afternoon  Called these into the pharmacy.    Pt reports she has not felt well the past 2 days, she has had n/v/d for 2 days.she did vomit while I was there today.  She did eat this morning and took her meds.  She reports several episodes of diarrhea and feels weak. Since she cannot keep things down I advised her to not take her afternoon dose of torsemide for today. She needs to eat things that are more gentle on her stomach-she had an ensure shake this morning and coffee. I advised her crackers, toast and ginger ale or sprite.  She denies increased sob, no dizziness, no c/p.  Lungs are clear. She reports no smoking for the past 2 days.  She did lay on her cardiomems while I was here.  Taken all meds. Just not staying down much past few days.  She needs meds in pill box as listed above.  Will come back out tomor if possible if meds are ready and place where needed.   Meds verified and pill box refilled.    CBG PTA-341 BP 138/64   Pulse 94   Resp 18   Wt 207 lb (93.9 kg)   LMP  (LMP Unknown)   SpO2 96%   BMI 37.86 kg/m  Weight yesterday-207  Last visit weight-21   Patient Care Team: Pcp, No as PCP - General Runell Gess, MD as PCP - Cardiology (Cardiology) Laurey Morale, MD as PCP - Advanced Heart Failure (Cardiology) Regan Lemming, MD as PCP - Electrophysiology (Cardiology) Runell Gess, MD as Consulting Physician (Cardiology) Nyoka Cowden, MD as Consulting Physician (Pulmonary Disease) System, Provider Not In  Patient Active  Problem List   Diagnosis Date Noted   Heart failure ([name]) 10/11/2023   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) and diastolic (congestive) heart failure ([name]) 10/10/2023   Pre-ulcerative calluses 04/25/2022   COPD (chronic obstructive pulmonary disease) ([name]) 05/05/2021   OSA (obstructive sleep apnea) 05/05/2021   Stage 3b chronic kidney disease ([name]) 05/05/2021   Pressure injury of skin 05/04/2021   Diabetic nephropathy ([name]) 01/02/2021   Low back pain 06/01/2020   Hyperlipidemia 04/03/2020   PAD (peripheral artery disease) ([name]) 12/26/2019   NICM (nonischemic cardiomyopathy) ([name]) 06/20/2019   Non-healing ulcer ([name]) 06/20/2019   Coagulation disorder ([name]) 08/09/2017   Depression 07/21/2017   At risk for adverse drug reaction 06/20/2017   Peripheral neuropathy 06/20/2017   S/P transmetatarsal amputation of foot, right ([name]) 06/05/2017   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) 05/19/2017   Obesity, class 2 02/24/2016   Encounter for therapeutic drug monitoring 02/10/2016   Symptomatic bradycardia 01/12/2016   Essential hypertension 12/22/2015   Elevated troponin    Acute on chronic diastolic CHF (congestive heart failure) ([name])    Anemia of chronic disease 11/08/2015   Hypokalemia 11/08/2015   Tobacco abuse 10/23/2015   Coronary artery disease    DOE (dyspnea on exertion) 04/29/2015   PAF (paroxysmal  atrial fibrillation) ([name]) 01/16/2015   Carotid arterial disease ([name]) 01/16/2015   Insomnia 02/03/2014   S/P peripheral artery angioplasty - TurboHawk atherectomy; R SFA 09/11/2013    Class: Acute   Leg pain, bilateral 08/19/2013   Hypothyroidism 07/31/2013   History of cocaine abuse ([name]) 06/13/2013   Long term current use of anticoagulant therapy 05/20/2013   Alcohol abuse    Narcotic abuse ([name])    Marijuana abuse    Alcoholic cirrhosis ([name])    Type 2 diabetes mellitus with hyperlipidemia ([name])      Current Outpatient Medications:    ACCU-CHEK GUIDE test strip, USE TO CHECK BLOOD SUGAR FOUR TIMES DAILY, Disp: , Rfl:    acetaminophen (TYLENOL) 500 MG tablet, Take 1,000 mg by mouth every 6 (six) hours as needed for headache (pain)., Disp: , Rfl:    albuterol (VENTOLIN HFA) 108 (90 Base) MCG/ACT inhaler, USE 2 PUFFS BY MOUTH EVERY FOUR HOURS, AS NEEDED, FOR COUGHING/WHEEZING, Disp: 8.5 g, Rfl: 0   allopurinol (ZYLOPRIM) 100 MG tablet, Take 150 mg by mouth daily., Disp: , Rfl:    apixaban (ELIQUIS) 5 MG TABS tablet, Take 1 tablet (5 mg total) by mouth 2 (two) times daily., Disp: 60 tablet, Rfl: 6   budesonide-formoterol (SYMBICORT) 160-4.5 MCG/ACT inhaler, Inhale 2 puffs into the lungs 2 (two) times daily., Disp: 1 each, Rfl: 2   buPROPion ER (WELLBUTRIN SR) 100 MG 12 hr tablet, Take 1 tablet (100 mg total) by mouth daily., Disp: 30 tablet, Rfl: 6   Cholecalciferol (VITAMIN D3 PO), Take 1 capsule by mouth every morning., Disp: , Rfl:    insulin lispro (HUMALOG) 100 UNIT/ML KwikPen, Inject 10 Units into the skin 3 (three) times daily with meals., Disp: 15 mL, Rfl: 0   LANTUS SOLOSTAR 100 UNIT/ML Solostar Pen, Inject 37 Units into the skin daily. (Patient taking differently: Inject 40 Units into the skin daily.), Disp: 15 mL, Rfl: 0   levothyroxine (SYNTHROID) 50 MCG tablet, TAKE 1 TABLET (50 MCG TOTAL) BY MOUTH DAILY BEFORE BREAKFAST (AM), Disp: 30 tablet, Rfl: 0   losartan (COZAAR) 25 MG tablet, Take 1 tablet (25 mg total) by mouth daily., Disp: 30 tablet, Rfl: 0   metolazone (ZAROXOLYN) 2.5 MG tablet, Take 1 tablet (2.5 mg total) by mouth once a week. Every Monday, Disp: 15 tablet, Rfl: 3   metoprolol succinate (TOPROL-XL) 25 MG 24 hr tablet, Take 1 tablet (25 mg total) by mouth daily., Disp: 90 tablet, Rfl: 3   pantoprazole (PROTONIX) 40 MG tablet, TAKE 1 TABLET (40 MG TOTAL) BY MOUTH DAILY(AM), Disp: 30 tablet, Rfl: 0   potassium chloride SA (KLOR-CON M) 20 MEQ tablet, Take 2 tablets (40  mEq total) by mouth once a week. With every dose of metolazone, Disp: , Rfl:    rosuvastatin (CRESTOR) 40 MG tablet, TAKE 1 TABLET (40 MG TOTAL) BY MOUTH DAILY (Patient taking differently: Take 40 mg by mouth at bedtime.), Disp: 15 tablet, Rfl: 0   spironolactone (ALDACTONE) 25 MG tablet, Take 1 tablet (25 mg total) by mouth daily., Disp: 90 tablet, Rfl: 3   torsemide 40 MG TABS, Take 80 mg by mouth 2 (two) times daily., Disp: 360 tablet, Rfl: 3   triamcinolone ointment (KENALOG) 0.1 %, Apply topically 2 (two) times daily., Disp: 454 g, Rfl: 0   VITAMIN E PO, Take 1 tablet by mouth daily., Disp: , Rfl:    colchicine 0.6 MG tablet, Take 1.2 mg by mouth See admin instructions. Take 1.2 mg for  flair up and an hour later take 0.6 mg if needed, Disp: , Rfl:    EASY COMFORT PEN NEEDLES 31G X 5 MM MISC, USE FOUR TIMES A DAY FOR INSULIN ADMINISTRATION, Disp: 100 each, Rfl: 1   fluticasone (FLONASE) 50 MCG/ACT nasal spray, Place 1 spray into both nostrils as needed for allergies or rhinitis., Disp: , Rfl:    Furosemide (FUROSCIX [place]), Inject into the skin as directed. As needed (Patient not taking: Reported on 01/03/2024), Disp: , Rfl:    Omega-3 Fatty Acids (FISH OIL PO), Take 1 tablet by mouth daily. (Patient not taking: Reported on 12/12/2023), Disp: , Rfl:    polyethylene glycol (MIRALAX / GLYCOLAX) 17 g packet, Take 17 g by mouth daily as needed for moderate constipation., Disp: 14 each, Rfl: 0 Allergies  Allergen Reactions   Gabapentin Nausea And Vomiting and Other (See Comments)    POSSIBLE SHAKING   Lyrica [Pregabalin] Other (See Comments)    Shaking       Social History   Socioeconomic History   Marital status: Single    Spouse name: Not on file   Number of children: 1   Years of education: 12   Highest education level: 12th grade  Occupational History   Occupation: disabled  Tobacco Use   Smoking status: Every Day    Current packs/day: 1.00    Average packs/day: 1 pack/day for 44.0  years (44.0 ttl pk-yrs)    Types: E-cigarettes, Cigarettes   Smokeless tobacco: Former    Types: Snuff  Vaping Use   Vaping status: Former   Devices: 11/26/2018 "stopped months ago"  Substance and Sexual Activity   Alcohol use: Not Currently    Comment: occ   Drug use: Not Currently    Types: "Crack" cocaine, Marijuana, Oxycodone   Sexual activity: Not Currently  Other Topics Concern   Not on file  Social History Narrative   ** Merged History Encounter **       Lives in [place], in motel with sister.  They are looking to move but don't have a place to go yet.     Social Drivers of Corporate investment banker Strain: Low Risk  (02/09/2023)   Overall Financial Resource Strain (CARDIA)    Difficulty of Paying Living Expenses: Not hard at all  Food Insecurity: No Food Insecurity (10/10/2023)   Hunger Vital Sign    Worried About Running Out of Food in the Last Year: Never true    Ran Out of Food in the Last Year: Never true  Transportation Needs: No Transportation Needs (10/10/2023)   PRAPARE - Administrator, Civil Service (Medical): No    Lack of Transportation (Non-Medical): No  Physical Activity: Inactive (02/09/2023)   Exercise Vital Sign    Days of Exercise per Week: 0 days    Minutes of Exercise per Session: 0 min  Stress: No Stress Concern Present (02/09/2023)   [employer] of Occupational Health - Occupational Stress Questionnaire    Feeling of Stress : Not at all  Social Connections: Unknown (08/21/2023)   Received from [facility]   Social Network    Social Network: Not on file  Intimate Partner Violence: Not At Risk (10/10/2023)   Humiliation, Afraid, Rape, and Kick questionnaire    Fear of Current or Ex-Partner: No    Emotionally Abused: No    Physically Abused: No    Sexually Abused: No    Physical Exam      Future Appointments  Date Time Provider Department Center  01/22/2024 10:00 AM LB ENDO/NEURO LAB [facility] None  01/25/2024  2:00  PM Altamese Barnard, MD [facility] None  02/08/2024 11:30 AM [facility] PA/NP [facility] None  02/13/2024  2:20 PM [facility] ANNUAL WELLNESS VISIT [facility] None  04/09/2024  3:00 PM [facility] HS VASC 4 [facility] V[facility]  04/09/2024  4:00 PM Leonie Douglas, MD [facility]       Kerry Hough, Paramedic [phone] [facility] Paramedic  01/03/24

## 2024-01-03 NOTE — ED Triage Notes (Signed)
Pt BIB EMS from home, reports NVD, cough, congestion x 3 days. Checked her sugar this morning and it was 350, took 16 u of insulin, checked again and it read high on glucometer, took 8 more units and called EMS. CBG 600 for EMS, given 200cc NS. Also noted to be diminished on L side given albuterol and atrovent. Hx of T2DM, CHF, and COPD.

## 2024-01-03 NOTE — ED Provider Notes (Signed)
[place] EMERGENCY DEPARTMENT AT [facility] Provider Note   CSN: [id number] Arrival date & time: 01/03/24  1628     History Chief Complaint  Patient presents with   Hyperglycemia    HPI [name] is a 63 y.o. female presenting for AMS/Fever/Cough. Patient fully alert GCS 15 on my arrival but EMS was called out as she was confused earlier in the day.  She states that she has been taking all of her medications as prescribed.  Endorses fever cough congestion malaise over the past few days some diarrhea and abdominal pain throughout the day today.  Intermittent in nature. Denies any urinary symptoms.  States that her blood sugars have been rising over the past few days despite compliance on all of her medication (3 times daily NovoLog 8 units and 40 units Lantus nightly) Patient's recorded medical, surgical, social, medication list and allergies were reviewed in the Snapshot window as part of the initial history.   Review of Systems   Review of Systems  Constitutional:  Negative for chills and fever.  HENT:  Positive for congestion and sore throat. Negative for ear pain.   Eyes:  Negative for pain and visual disturbance.  Respiratory:  Positive for cough. Negative for choking and shortness of breath.   Cardiovascular:  Negative for chest pain and palpitations.  Gastrointestinal:  Negative for abdominal pain and vomiting.  Genitourinary:  Negative for dysuria and hematuria.  Musculoskeletal:  Negative for arthralgias and back pain.  Skin:  Negative for color change and rash.  Neurological:  Negative for seizures and syncope.  Psychiatric/Behavioral:  Positive for confusion.   All other systems reviewed and are negative.   Physical Exam Updated Vital Signs BP 127/66   Pulse 89   Temp 98.3 F (36.8 C) (Oral)   Resp 12   Ht 5\' 4"  (1.626 m)   Wt 93.9 kg   LMP  (LMP Unknown)   SpO2 97%   BMI 35.53 kg/m  Physical Exam Vitals and nursing note reviewed.   Constitutional:      General: She is not in acute distress.    Appearance: She is well-developed.  HENT:     Head: Normocephalic and atraumatic.  Eyes:     Conjunctiva/sclera: Conjunctivae normal.  Cardiovascular:     Rate and Rhythm: Normal rate and regular rhythm.     Heart sounds: No murmur heard. Pulmonary:     Effort: Pulmonary effort is normal. No respiratory distress.     Breath sounds: Normal breath sounds.  Abdominal:     General: There is no distension.     Palpations: Abdomen is soft.     Tenderness: There is no abdominal tenderness. There is no right CVA tenderness or left CVA tenderness.  Musculoskeletal:        General: No swelling or tenderness. Normal range of motion.     Cervical back: Neck supple.  Skin:    General: Skin is warm and dry.  Neurological:     General: No focal deficit present.     Mental Status: She is alert and oriented to person, place, and time. Mental status is at baseline.     Cranial Nerves: No cranial nerve deficit.      ED Course/ Medical Decision Making/ A&P    Procedures Procedures   Medications Ordered in ED Medications  lactated ringers bolus 1,000 mL (1,000 mLs Intravenous New Bag/Given 01/03/24 1832)  insulin aspart (novoLOG) injection 10 Units (10 Units Subcutaneous Given 01/03/24  52)   Medical Decision Making:   This is a 63 year old female presenting with elevated blood glucoses and a myriad of other symptoms.   Initial Assessment:   Her presentation of the intermittent delirium and elevated blood glucoses are most consistent with being secondary to 2/2 infectious etiology (UTI/CAP/URI) vs metabolic abnormality (Na/K/Mg/Ca) vs nonspecific etiology. Other diagnoses were considered including (but not limited to) CVA, ICH, intracranial mass, critical dehydration, heptatic dysfunction, uremia, hypercarbia, intoxication, endrocrine abnormality, toxidrome. These are considered less likely due to history of present illness and  physical exam findings.   This is most consistent with an acute life/limb threatening illness complicated by underlying chronic conditions.  Initial Plan:  CTH to evaluate for intracranial etiology of patient's symptoms  Screening labs including CBC and Metabolic panel to evaluate for infectious or metabolic etiology of disease.  Urinalysis with reflex culture ordered to evaluate for UTI or relevant urologic/nephrologic pathology.  CXR to evaluate for structural/infectious intrathoracic pathology.  EKG to evaluate for cardiac pathology Objective evaluation as below reviewed   Initial Study Results:   Laboratory  All laboratory results reviewed without evidence of clinically relevant pathology.   Exceptions include: Hyperglycemia.  Will treat with her insulin and IV fluids and reassess  EKG EKG was reviewed independently. Rate, rhythm, axis, intervals all examined and without medically relevant abnormality. ST segments without concerns for elevations.    Radiology:  All images reviewed independently. Agree with radiology report at this time.   DG Chest Portable 1 View Result Date: 01/03/2024 CLINICAL DATA:  SOB/URI/Weakness EXAM: PORTABLE CHEST 1 VIEW COMPARISON:  Chest x-ray 10/10/2023, CT chest 06/16/2023 FINDINGS: Patient is rotated. The heart and mediastinal contours are within normal limits. CardioMEMS noted on the left. Atherosclerotic plaque. No focal consolidation. No pulmonary edema. No pleural effusion. No pneumothorax. No acute osseous abnormality. IMPRESSION: 1. No active disease. 2.  Aortic Atherosclerosis (ICD10-I70.0). Electronically Signed   By: Tish Frederickson M.D.   On: 01/03/2024 17:31    Final Assessment and Plan:   Hyperglycemia grossly improved with relatively mild treatment. Her mental status has also been at baseline since my initiation of evaluation. Long conversation with patient regarding her history of present illness and physical exam findings. She feels very  comfortable with outpatient care management follow-up in the outpatient setting. No evidence of UTI, pneumonia.  She does have some symptom consistent with likely viral upper respiratory faction.  This may be causing a stress hyperglycemia response. Given that she is improving with relatively mild treatment in the ED, she is requesting discharge outpatient care management follow-up with PCP.  Strict return precautions reinforced patient feels comfortable discharge at this time.  Disposition:  I have considered need for hospitalization, however, considering all of the above, I believe this patient is stable for discharge at this time.  Patient/family educated about specific return precautions for given chief complaint and symptoms.  Patient/family educated about follow-up with PCP.     Patient/family expressed understanding of return precautions and need for follow-up. Patient spoken to regarding all imaging and laboratory results and appropriate follow up for these results. All education provided in verbal form with additional information in written form. Time was allowed for answering of patient questions. Patient discharged.    Emergency Department Medication Summary:   Medications  lactated ringers bolus 1,000 mL (1,000 mLs Intravenous New Bag/Given 01/03/24 1832)  insulin aspart (novoLOG) injection 10 Units (10 Units Subcutaneous Given 01/03/24 1832)          Clinical  Impression:  1. Altered mental status, unspecified altered mental status type   2. Hyperglycemia      Discharge   Final Clinical Impression(s) / ED Diagnoses Final diagnoses:  Altered mental status, unspecified altered mental status type  Hyperglycemia    Rx / DC Orders ED Discharge Orders     None         Glyn Ade, MD 01/03/24 2227

## 2024-01-04 ENCOUNTER — Other Ambulatory Visit (HOSPITAL_COMMUNITY): Payer: Self-pay

## 2024-01-04 NOTE — Progress Notes (Signed)
Paramedicine Encounter    Patient ID: [name], female    DOB: 09/18/1961, 63 y.o.   MRN: [id number]    Came out today for med rec.  Had to get meds from pharmacy.   Placed torsemide, levothyroxine, allopurinol  where needed.   She went to ER for hyperglycemia yesterday.  She reprorts CBG pta was 340 again, but she ate this morning and took her insulin, but she feels much better. No more n/v/d.   Will see her next week.    Patient Care Team: Hillery Aldo, NP as PCP - General Allyson Sabal Delton See, MD as PCP - Cardiology (Cardiology) Laurey Morale, MD as PCP - Advanced Heart Failure (Cardiology) Regan Lemming, MD as PCP - Electrophysiology (Cardiology) Runell Gess, MD as Consulting Physician (Cardiology) Nyoka Cowden, MD as Consulting Physician (Pulmonary Disease) System, Provider Not In  Patient Active Problem List   Diagnosis Date Noted   Heart failure ([name]) 10/11/2023   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) and diastolic (congestive) heart failure ([name]) 10/10/2023   Pre-ulcerative calluses 04/25/2022   COPD (chronic obstructive pulmonary disease) ([name]) 05/05/2021   OSA (obstructive sleep apnea) 05/05/2021   Stage 3b chronic kidney disease ([name]) 05/05/2021   Pressure injury of skin 05/04/2021   Diabetic nephropathy ([name]) 01/02/2021   Low back pain 06/01/2020   Hyperlipidemia 04/03/2020   PAD (peripheral artery disease) ([name]) 12/26/2019   NICM (nonischemic cardiomyopathy) ([name]) 06/20/2019   Non-healing ulcer ([name]) 06/20/2019   Coagulation disorder ([name]) 08/09/2017   Depression 07/21/2017   At risk for adverse drug reaction 06/20/2017   Peripheral neuropathy 06/20/2017   S/P transmetatarsal amputation of foot, right ([name]) 06/05/2017   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) 05/19/2017   Obesity, class 2 02/24/2016   Encounter for therapeutic drug monitoring 02/10/2016   Symptomatic  bradycardia 01/12/2016   Essential hypertension 12/22/2015   Elevated troponin    Acute on chronic diastolic CHF (congestive heart failure) ([name])    Anemia of chronic disease 11/08/2015   Hypokalemia 11/08/2015   Tobacco abuse 10/23/2015   Coronary artery disease    DOE (dyspnea on exertion) 04/29/2015   PAF (paroxysmal atrial fibrillation) ([name]) 01/16/2015   Carotid arterial disease ([name]) 01/16/2015   Insomnia 02/03/2014   S/P peripheral artery angioplasty - TurboHawk atherectomy; R SFA 09/11/2013    Class: Acute   Leg pain, bilateral 08/19/2013   Hypothyroidism 07/31/2013   History of cocaine abuse ([name]) 06/13/2013   Long term current use of anticoagulant therapy 05/20/2013   Alcohol abuse    Narcotic abuse ([name])    Marijuana abuse    Alcoholic cirrhosis ([name])    Type 2 diabetes mellitus with hyperlipidemia ([name])     Current Outpatient Medications:    ACCU-CHEK GUIDE test strip, USE TO CHECK BLOOD SUGAR FOUR TIMES DAILY, Disp: , Rfl:    acetaminophen (TYLENOL) 500 MG tablet, Take 1,000 mg by mouth every 6 (six) hours as needed for headache (pain)., Disp: , Rfl:    albuterol (VENTOLIN HFA) 108 (90 Base) MCG/ACT inhaler, USE 2 PUFFS BY MOUTH EVERY FOUR HOURS, AS NEEDED, FOR COUGHING/WHEEZING, Disp: 8.5 g, Rfl: 0   allopurinol (ZYLOPRIM) 100 MG tablet, Take 150 mg by mouth daily., Disp: , Rfl:    apixaban (ELIQUIS) 5 MG TABS tablet, Take 1 tablet (5 mg total) by mouth 2 (two) times daily., Disp: 60 tablet, Rfl: 6   budesonide-formoterol (SYMBICORT) 160-4.5 MCG/ACT inhaler, Inhale  2 puffs into the lungs 2 (two) times daily., Disp: 1 each, Rfl: 2   buPROPion ER (WELLBUTRIN SR) 100 MG 12 hr tablet, Take 1 tablet (100 mg total) by mouth daily., Disp: 30 tablet, Rfl: 6   Cholecalciferol (VITAMIN D3 PO), Take 1 capsule by mouth every morning., Disp: , Rfl:    colchicine 0.6 MG tablet, Take 1.2 mg by mouth See admin instructions. Take 1.2 mg for flair up and an hour later take 0.6 mg if  needed, Disp: , Rfl:    EASY COMFORT PEN NEEDLES 31G X 5 MM MISC, USE FOUR TIMES A DAY FOR INSULIN ADMINISTRATION, Disp: 100 each, Rfl: 1   fluticasone (FLONASE) 50 MCG/ACT nasal spray, Place 1 spray into both nostrils as needed for allergies or rhinitis., Disp: , Rfl:    Furosemide (FUROSCIX [place]), Inject into the skin as directed. As needed (Patient not taking: Reported on 01/03/2024), Disp: , Rfl:    insulin lispro (HUMALOG) 100 UNIT/ML KwikPen, Inject 10 Units into the skin 3 (three) times daily with meals., Disp: 15 mL, Rfl: 0   LANTUS SOLOSTAR 100 UNIT/ML Solostar Pen, Inject 37 Units into the skin daily. (Patient taking differently: Inject 40 Units into the skin daily.), Disp: 15 mL, Rfl: 0   levothyroxine (SYNTHROID) 50 MCG tablet, TAKE 1 TABLET (50 MCG TOTAL) BY MOUTH DAILY BEFORE BREAKFAST (AM), Disp: 30 tablet, Rfl: 0   losartan (COZAAR) 25 MG tablet, Take 1 tablet (25 mg total) by mouth daily., Disp: 30 tablet, Rfl: 0   metolazone (ZAROXOLYN) 2.5 MG tablet, Take 1 tablet (2.5 mg total) by mouth once a week. Every Monday, Disp: 15 tablet, Rfl: 3   metoprolol succinate (TOPROL-XL) 25 MG 24 hr tablet, Take 1 tablet (25 mg total) by mouth daily., Disp: 90 tablet, Rfl: 3   Omega-3 Fatty Acids (FISH OIL PO), Take 1 tablet by mouth daily. (Patient not taking: Reported on 12/12/2023), Disp: , Rfl:    pantoprazole (PROTONIX) 40 MG tablet, TAKE 1 TABLET (40 MG TOTAL) BY MOUTH DAILY(AM), Disp: 30 tablet, Rfl: 0   polyethylene glycol (MIRALAX / GLYCOLAX) 17 g packet, Take 17 g by mouth daily as needed for moderate constipation., Disp: 14 each, Rfl: 0   potassium chloride SA (KLOR-CON M) 20 MEQ tablet, Take 2 tablets (40 mEq total) by mouth once a week. With every dose of metolazone, Disp: , Rfl:    rosuvastatin (CRESTOR) 40 MG tablet, TAKE 1 TABLET (40 MG TOTAL) BY MOUTH DAILY (Patient taking differently: Take 40 mg by mouth at bedtime.), Disp: 15 tablet, Rfl: 0   spironolactone (ALDACTONE) 25 MG tablet,  Take 1 tablet (25 mg total) by mouth daily., Disp: 90 tablet, Rfl: 3   torsemide 40 MG TABS, Take 80 mg by mouth 2 (two) times daily., Disp: 360 tablet, Rfl: 3   triamcinolone ointment (KENALOG) 0.1 %, Apply topically 2 (two) times daily., Disp: 454 g, Rfl: 0   VITAMIN E PO, Take 1 tablet by mouth daily., Disp: , Rfl:  Allergies  Allergen Reactions   Gabapentin Nausea And Vomiting and Other (See Comments)    POSSIBLE SHAKING   Lyrica [Pregabalin] Other (See Comments)    Shaking       Social History   Socioeconomic History   Marital status: Single    Spouse name: Not on file   Number of children: 1   Years of education: 55   Highest education level: 12th grade  Occupational History   Occupation: disabled  Tobacco  Use   Smoking status: Every Day    Current packs/day: 1.00    Average packs/day: 1 pack/day for 44.0 years (44.0 ttl pk-yrs)    Types: E-cigarettes, Cigarettes   Smokeless tobacco: Former    Types: Snuff  Vaping Use   Vaping status: Former   Devices: 11/26/2018 "stopped months ago"  Substance and Sexual Activity   Alcohol use: Not Currently    Comment: occ   Drug use: Not Currently    Types: "Crack" cocaine, Marijuana, Oxycodone   Sexual activity: Not Currently  Other Topics Concern   Not on file  Social History Narrative   ** Merged History Encounter **       Lives in [place], in motel with sister.  They are looking to move but don't have a place to go yet.     Social Drivers of Corporate investment banker Strain: Low Risk  (02/09/2023)   Overall Financial Resource Strain (CARDIA)    Difficulty of Paying Living Expenses: Not hard at all  Food Insecurity: No Food Insecurity (10/10/2023)   Hunger Vital Sign    Worried About Running Out of Food in the Last Year: Never true    Ran Out of Food in the Last Year: Never true  Transportation Needs: No Transportation Needs (10/10/2023)   PRAPARE - Administrator, Civil Service (Medical): No    Lack of  Transportation (Non-Medical): No  Physical Activity: Inactive (02/09/2023)   Exercise Vital Sign    Days of Exercise per Week: 0 days    Minutes of Exercise per Session: 0 min  Stress: No Stress Concern Present (02/09/2023)   [employer] of Occupational Health - Occupational Stress Questionnaire    Feeling of Stress : Not at all  Social Connections: Unknown (08/21/2023)   Received from [facility]   Social Network    Social Network: Not on file  Intimate Partner Violence: Not At Risk (10/10/2023)   Humiliation, Afraid, Rape, and Kick questionnaire    Fear of Current or Ex-Partner: No    Emotionally Abused: No    Physically Abused: No    Sexually Abused: No    Physical Exam      Future Appointments  Date Time Provider Department Center  01/22/2024 10:00 AM [facility] ENDO/NEURO LAB [facility] None  01/25/2024  2:00 PM [place], MD [facility] None  02/08/2024 11:30 AM [facility] PA/NP [facility] None  02/13/2024  2:20 PM [facility] ANNUAL WELLNESS VISIT [facility] None  04/09/2024  3:00 PM [facility] VASC 4 [facility]  04/09/2024  4:00 PM Leonie Douglas, MD [facility]       Kerry Hough, Paramedic [phone] [facility] Paramedic  01/04/24

## 2024-01-10 ENCOUNTER — Other Ambulatory Visit (HOSPITAL_COMMUNITY): Payer: Self-pay

## 2024-01-10 NOTE — Progress Notes (Signed)
 Paramedicine Encounter    Patient ID: [name], female    DOB: [birth year], 63 y.o.   MRN: [id number]   Complaints-doesn't feel good, depressed  Edema-minimal   Compliance with meds-yes--her sister knocked over the pill box over wknd and spilled pills and she tried to take them from bottles best she could   Pill box filled-yes  If so, by whom-paramedic   Refills needed-eliquis-needs in pill box in sat-tues PM dose  -pantoprazole -metoprolol -spiro -metolazone  -colchicine -ordered that now  as well   I arrived to see pt this morning, she was tearful, c/o feeling bad, hurting all over can't sleep on the mattress she has-hurts her, and feels depressed.  I asked if I could do anything and she denied.  Her CBG's still running elevated, she denies c/p, no dizziness, no increased sob.  Pt reports she does have a cough, productive at times of yellow sputum, she does have a mild wheeze to rt lower lobes, other lobes, rhonchus/clear. She is still smoking. She had quit while she was sick last week and lungs sounded much better but has picked it back up more again.  She was talking of something about getting her heart back in rhythm, I did find a note ref referral to EP for ablation.-she was seen on 1/28 at EP doc.  According to that note, she would not be good candidate for ablation and would be better served for medication therapy due to her numerous risk factors/poor healing etc.   She reports she has to get back in with PCP ref CBG's and also she as wondering if she needs a neb machine again. I advised her to speak to PCP ref this.  I think in the next week she would be good to start bubble packs again.   Meds verified and pill box refilled.  Will come back out to place eliquis where  needed.    Weight at home 205-207  CBG-still elevated in the 300s BP 118/64   Pulse 88   Resp 18   Wt 205 lb (93 kg)   LMP  (LMP Unknown)   SpO2 98%   BMI 35.19 kg/m  Weight  yesterday-205-207 Last visit weight-207  Patient Care Team: Hillery Aldo, NP as PCP - General Allyson Sabal Delton See, MD as PCP - Cardiology (Cardiology) Laurey Morale, MD as PCP - Advanced Heart Failure (Cardiology) Regan Lemming, MD as PCP - Electrophysiology (Cardiology) Runell Gess, MD as Consulting Physician (Cardiology) Nyoka Cowden, MD as Consulting Physician (Pulmonary Disease) System, Provider Not In  Patient Active Problem List   Diagnosis Date Noted   Heart failure ([name]) 10/11/2023   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) and diastolic (congestive) heart failure ([name]) 10/10/2023   Pre-ulcerative calluses 04/25/2022   COPD (chronic obstructive pulmonary disease) ([name]) 05/05/2021   OSA (obstructive sleep apnea) 05/05/2021   Stage 3b chronic kidney disease ([name]) 05/05/2021   Pressure injury of skin 05/04/2021   Diabetic nephropathy ([name]) 01/02/2021   Low back pain 06/01/2020   Hyperlipidemia 04/03/2020   PAD (peripheral artery disease) ([name]) 12/26/2019   NICM (nonischemic cardiomyopathy) ([name]) 06/20/2019   Non-healing ulcer ([name]) 06/20/2019   Coagulation disorder ([name]) 08/09/2017   Depression 07/21/2017   At risk for adverse drug reaction 06/20/2017   Peripheral neuropathy 06/20/2017   S/P transmetatarsal amputation of foot, right ([name]) 06/05/2017   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) 05/19/2017   Obesity, class  2 02/24/2016   Encounter for therapeutic drug monitoring 02/10/2016   Symptomatic bradycardia 01/12/2016   Essential hypertension 12/22/2015   Elevated troponin    Acute on chronic diastolic CHF (congestive heart failure) ([name])    Anemia of chronic disease 11/08/2015   Hypokalemia 11/08/2015   Tobacco abuse 10/23/2015   Coronary artery disease    DOE (dyspnea on exertion) 04/29/2015   PAF (paroxysmal atrial fibrillation) ([name]) 01/16/2015   Carotid arterial disease ([name])  01/16/2015   Insomnia 02/03/2014   S/P peripheral artery angioplasty - TurboHawk atherectomy; R SFA 09/11/2013    Class: Acute   Leg pain, bilateral 08/19/2013   Hypothyroidism 07/31/2013   History of cocaine abuse ([name]) 06/13/2013   Long term current use of anticoagulant therapy 05/20/2013   Alcohol abuse    Narcotic abuse ([name])    Marijuana abuse    Alcoholic cirrhosis ([name])    Type 2 diabetes mellitus with hyperlipidemia ([name])     Current Outpatient Medications:    ACCU-CHEK GUIDE test strip, USE TO CHECK BLOOD SUGAR FOUR TIMES DAILY, Disp: , Rfl:    acetaminophen (TYLENOL) 500 MG tablet, Take 1,000 mg by mouth every 6 (six) hours as needed for headache (pain)., Disp: , Rfl:    albuterol (VENTOLIN HFA) 108 (90 Base) MCG/ACT inhaler, USE 2 PUFFS BY MOUTH EVERY FOUR HOURS, AS NEEDED, FOR COUGHING/WHEEZING, Disp: 8.5 g, Rfl: 0   allopurinol (ZYLOPRIM) 100 MG tablet, Take 150 mg by mouth daily., Disp: , Rfl:    apixaban (ELIQUIS) 5 MG TABS tablet, Take 1 tablet (5 mg total) by mouth 2 (two) times daily., Disp: 60 tablet, Rfl: 6   budesonide-formoterol (SYMBICORT) 160-4.5 MCG/ACT inhaler, Inhale 2 puffs into the lungs 2 (two) times daily., Disp: 1 each, Rfl: 2   buPROPion ER (WELLBUTRIN SR) 100 MG 12 hr tablet, Take 1 tablet (100 mg total) by mouth daily., Disp: 30 tablet, Rfl: 6   Cholecalciferol (VITAMIN D3 PO), Take 1 capsule by mouth every morning., Disp: , Rfl:    colchicine 0.6 MG tablet, Take 1.2 mg by mouth See admin instructions. Take 1.2 mg for flair up and an hour later take 0.6 mg if needed, Disp: , Rfl:    EASY COMFORT PEN NEEDLES 31G X 5 MM MISC, USE FOUR TIMES A DAY FOR INSULIN ADMINISTRATION, Disp: 100 each, Rfl: 1   fluticasone (FLONASE) 50 MCG/ACT nasal spray, Place 1 spray into both nostrils as needed for allergies or rhinitis., Disp: , Rfl:    Furosemide (FUROSCIX [place]), Inject into the skin as directed. As needed (Patient not taking: Reported on 01/03/2024), Disp: , Rfl:     insulin lispro (HUMALOG) 100 UNIT/ML KwikPen, Inject 10 Units into the skin 3 (three) times daily with meals., Disp: 15 mL, Rfl: 0   LANTUS SOLOSTAR 100 UNIT/ML Solostar Pen, Inject 37 Units into the skin daily. (Patient taking differently: Inject 40 Units into the skin daily.), Disp: 15 mL, Rfl: 0   levothyroxine (SYNTHROID) 50 MCG tablet, TAKE 1 TABLET (50 MCG TOTAL) BY MOUTH DAILY BEFORE BREAKFAST (AM), Disp: 30 tablet, Rfl: 0   losartan (COZAAR) 25 MG tablet, Take 1 tablet (25 mg total) by mouth daily., Disp: 30 tablet, Rfl: 0   metolazone (ZAROXOLYN) 2.5 MG tablet, Take 1 tablet (2.5 mg total) by mouth once a week. Every Monday, Disp: 15 tablet, Rfl: 3   metoprolol succinate (TOPROL-XL) 25 MG 24 hr tablet, Take 1 tablet (25 mg total) by mouth daily., Disp: 90 tablet, Rfl: 3  Omega-3 Fatty Acids (FISH OIL PO), Take 1 tablet by mouth daily. (Patient not taking: Reported on 12/12/2023), Disp: , Rfl:    pantoprazole (PROTONIX) 40 MG tablet, TAKE 1 TABLET (40 MG TOTAL) BY MOUTH DAILY(AM), Disp: 30 tablet, Rfl: 0   polyethylene glycol (MIRALAX / GLYCOLAX) 17 g packet, Take 17 g by mouth daily as needed for moderate constipation., Disp: 14 each, Rfl: 0   potassium chloride SA (KLOR-CON M) 20 MEQ tablet, Take 2 tablets (40 mEq total) by mouth once a week. With every dose of metolazone, Disp: , Rfl:    rosuvastatin (CRESTOR) 40 MG tablet, TAKE 1 TABLET (40 MG TOTAL) BY MOUTH DAILY (Patient taking differently: Take 40 mg by mouth at bedtime.), Disp: 15 tablet, Rfl: 0   spironolactone (ALDACTONE) 25 MG tablet, Take 1 tablet (25 mg total) by mouth daily., Disp: 90 tablet, Rfl: 3   torsemide 40 MG TABS, Take 80 mg by mouth 2 (two) times daily., Disp: 360 tablet, Rfl: 3   triamcinolone ointment (KENALOG) 0.1 %, Apply topically 2 (two) times daily., Disp: 454 g, Rfl: 0   VITAMIN E PO, Take 1 tablet by mouth daily., Disp: , Rfl:  Allergies  Allergen Reactions   Gabapentin Nausea And Vomiting and Other (See  Comments)    POSSIBLE SHAKING   Lyrica [Pregabalin] Other (See Comments)    Shaking       Social History   Socioeconomic History   Marital status: Single    Spouse name: Not on file   Number of children: 1   Years of education: 12   Highest education level: 12th grade  Occupational History   Occupation: disabled  Tobacco Use   Smoking status: Every Day    Current packs/day: 1.00    Average packs/day: 1 pack/day for 44.0 years (44.0 ttl pk-yrs)    Types: E-cigarettes, Cigarettes   Smokeless tobacco: Former    Types: Snuff  Vaping Use   Vaping status: Former   Devices: 11/26/2018 "stopped months ago"  Substance and Sexual Activity   Alcohol use: Not Currently    Comment: occ   Drug use: Not Currently    Types: "Crack" cocaine, Marijuana, Oxycodone   Sexual activity: Not Currently  Other Topics Concern   Not on file  Social History Narrative   ** Merged History Encounter **       Lives in [place], in motel with sister.  They are looking to move but don't have a place to go yet.     Social Drivers of Corporate investment banker Strain: Low Risk  (02/09/2023)   Overall Financial Resource Strain (CARDIA)    Difficulty of Paying Living Expenses: Not hard at all  Food Insecurity: No Food Insecurity (10/10/2023)   Hunger Vital Sign    Worried About Running Out of Food in the Last Year: Never true    Ran Out of Food in the Last Year: Never true  Transportation Needs: No Transportation Needs (10/10/2023)   PRAPARE - Administrator, Civil Service (Medical): No    Lack of Transportation (Non-Medical): No  Physical Activity: Inactive (02/09/2023)   Exercise Vital Sign    Days of Exercise per Week: 0 days    Minutes of Exercise per Session: 0 min  Stress: No Stress Concern Present (02/09/2023)   [employer] of Occupational Health - Occupational Stress Questionnaire    Feeling of Stress : Not at all  Social Connections: Unknown (08/21/2023)   Received from  [facility]   Social Network    Social Network: Not on file  Intimate Partner Violence: Not At Risk (10/10/2023)   Humiliation, Afraid, Rape, and Kick questionnaire    Fear of Current or Ex-Partner: No    Emotionally Abused: No    Physically Abused: No    Sexually Abused: No    Physical Exam      Future Appointments  Date Time Provider Department Center  01/22/2024 10:00 AM [facility] ENDO/NEURO LAB [facility] None  01/25/2024  2:00 PM Altamese [place], MD [facility] None  02/08/2024 11:30 AM [facility] PA/NP [facility] None  02/13/2024  2:20 PM [facility] ANNUAL WELLNESS VISIT [facility] None  04/09/2024  3:00 PM [facility] HS VASC 4 [facility]  04/09/2024  4:00 PM Leonie Douglas, MD [facility]       Kerry Hough, Paramedic [phone] [facility] Paramedic  01/10/24

## 2024-01-11 ENCOUNTER — Other Ambulatory Visit (HOSPITAL_COMMUNITY): Payer: Self-pay

## 2024-01-11 ENCOUNTER — Telehealth: Payer: Self-pay

## 2024-01-11 NOTE — Telephone Encounter (Signed)
 Patient is no longer under PCP care.     Copied from CRM [id number]. Topic: Clinical - Home Health Verbal Orders >> Jan 10, 2024  1:12 PM Dennison Nancy wrote: Caller/Agency: [name] with [facility]  Callback Number: [phone]  Service Requested: na Frequency: N/A Any new concerns about the patient? Yes her weight is 207 she lost weight , her pain is rated at a 7  Patient is not taking the Gabapentin  because make patient shake

## 2024-01-11 NOTE — Progress Notes (Signed)
 Paramedicine Encounter    Patient ID: [name], female    DOB: 1961/06/17, 63 y.o.   MRN: [id number]      Came out to place eliquis in pill box where needed.    Patient Care Team: Hillery Aldo, NP as PCP - General Allyson Sabal Delton See, MD as PCP - Cardiology (Cardiology) Laurey Morale, MD as PCP - Advanced Heart Failure (Cardiology) Regan Lemming, MD as PCP - Electrophysiology (Cardiology) Runell Gess, MD as Consulting Physician (Cardiology) Nyoka Cowden, MD as Consulting Physician (Pulmonary Disease) System, Provider Not In  Patient Active Problem List   Diagnosis Date Noted   Heart failure ([name]) 10/11/2023   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) and diastolic (congestive) heart failure ([name]) 10/10/2023   Pre-ulcerative calluses 04/25/2022   COPD (chronic obstructive pulmonary disease) ([name]) 05/05/2021   OSA (obstructive sleep apnea) 05/05/2021   Stage 3b chronic kidney disease ([name]) 05/05/2021   Pressure injury of skin 05/04/2021   Diabetic nephropathy ([name]) 01/02/2021   Low back pain 06/01/2020   Hyperlipidemia 04/03/2020   PAD (peripheral artery disease) ([name]) 12/26/2019   NICM (nonischemic cardiomyopathy) ([name]) 06/20/2019   Non-healing ulcer ([name]) 06/20/2019   Coagulation disorder ([name]) 08/09/2017   Depression 07/21/2017   At risk for adverse drug reaction 06/20/2017   Peripheral neuropathy 06/20/2017   S/P transmetatarsal amputation of foot, right ([name]) 06/05/2017   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) 05/19/2017   Obesity, class 2 02/24/2016   Encounter for therapeutic drug monitoring 02/10/2016   Symptomatic bradycardia 01/12/2016   Essential hypertension 12/22/2015   Elevated troponin    Acute on chronic diastolic CHF (congestive heart failure) ([name])    Anemia of chronic disease 11/08/2015   Hypokalemia 11/08/2015   Tobacco abuse 10/23/2015   Coronary artery disease     DOE (dyspnea on exertion) 04/29/2015   PAF (paroxysmal atrial fibrillation) ([name]) 01/16/2015   Carotid arterial disease ([name]) 01/16/2015   Insomnia 02/03/2014   S/P peripheral artery angioplasty - TurboHawk atherectomy; R SFA 09/11/2013    Class: Acute   Leg pain, bilateral 08/19/2013   Hypothyroidism 07/31/2013   History of cocaine abuse ([name]) 06/13/2013   Long term current use of anticoagulant therapy 05/20/2013   Alcohol abuse    Narcotic abuse ([name])    Marijuana abuse    Alcoholic cirrhosis ([name])    Type 2 diabetes mellitus with hyperlipidemia ([name])     Current Outpatient Medications:    ACCU-CHEK GUIDE test strip, USE TO CHECK BLOOD SUGAR FOUR TIMES DAILY, Disp: , Rfl:    acetaminophen (TYLENOL) 500 MG tablet, Take 1,000 mg by mouth every 6 (six) hours as needed for headache (pain)., Disp: , Rfl:    albuterol (VENTOLIN HFA) 108 (90 Base) MCG/ACT inhaler, USE 2 PUFFS BY MOUTH EVERY FOUR HOURS, AS NEEDED, FOR COUGHING/WHEEZING, Disp: 8.5 g, Rfl: 0   allopurinol (ZYLOPRIM) 100 MG tablet, Take 150 mg by mouth daily., Disp: , Rfl:    apixaban (ELIQUIS) 5 MG TABS tablet, Take 1 tablet (5 mg total) by mouth 2 (two) times daily., Disp: 60 tablet, Rfl: 6   budesonide-formoterol (SYMBICORT) 160-4.5 MCG/ACT inhaler, Inhale 2 puffs into the lungs 2 (two) times daily., Disp: 1 each, Rfl: 2   buPROPion ER (WELLBUTRIN SR) 100 MG 12 hr tablet, Take 1 tablet (100 mg total) by mouth daily., Disp: 30 tablet, Rfl: 6   Cholecalciferol (VITAMIN D3 PO), Take 1 capsule by mouth every  morning., Disp: , Rfl:    colchicine 0.6 MG tablet, Take 1.2 mg by mouth See admin instructions. Take 1.2 mg for flair up and an hour later take 0.6 mg if needed, Disp: , Rfl:    EASY COMFORT PEN NEEDLES 31G X 5 MM MISC, USE FOUR TIMES A DAY FOR INSULIN ADMINISTRATION, Disp: 100 each, Rfl: 1   fluticasone (FLONASE) 50 MCG/ACT nasal spray, Place 1 spray into both nostrils as needed for allergies or rhinitis., Disp: , Rfl:     Furosemide (FUROSCIX [place]), Inject into the skin as directed. As needed (Patient not taking: Reported on 01/03/2024), Disp: , Rfl:    insulin lispro (HUMALOG) 100 UNIT/ML KwikPen, Inject 10 Units into the skin 3 (three) times daily with meals., Disp: 15 mL, Rfl: 0   LANTUS SOLOSTAR 100 UNIT/ML Solostar Pen, Inject 37 Units into the skin daily. (Patient taking differently: Inject 40 Units into the skin daily.), Disp: 15 mL, Rfl: 0   levothyroxine (SYNTHROID) 50 MCG tablet, TAKE 1 TABLET (50 MCG TOTAL) BY MOUTH DAILY BEFORE BREAKFAST (AM), Disp: 30 tablet, Rfl: 0   losartan (COZAAR) 25 MG tablet, Take 1 tablet (25 mg total) by mouth daily., Disp: 30 tablet, Rfl: 0   metolazone (ZAROXOLYN) 2.5 MG tablet, Take 1 tablet (2.5 mg total) by mouth once a week. Every Monday, Disp: 15 tablet, Rfl: 3   metoprolol succinate (TOPROL-XL) 25 MG 24 hr tablet, Take 1 tablet (25 mg total) by mouth daily., Disp: 90 tablet, Rfl: 3   Omega-3 Fatty Acids (FISH OIL PO), Take 1 tablet by mouth daily. (Patient not taking: Reported on 12/12/2023), Disp: , Rfl:    pantoprazole (PROTONIX) 40 MG tablet, TAKE 1 TABLET (40 MG TOTAL) BY MOUTH DAILY(AM), Disp: 30 tablet, Rfl: 0   polyethylene glycol (MIRALAX / GLYCOLAX) 17 g packet, Take 17 g by mouth daily as needed for moderate constipation., Disp: 14 each, Rfl: 0   potassium chloride SA (KLOR-CON M) 20 MEQ tablet, Take 2 tablets (40 mEq total) by mouth once a week. With every dose of metolazone, Disp: , Rfl:    rosuvastatin (CRESTOR) 40 MG tablet, TAKE 1 TABLET (40 MG TOTAL) BY MOUTH DAILY (Patient taking differently: Take 40 mg by mouth at bedtime.), Disp: 15 tablet, Rfl: 0   spironolactone (ALDACTONE) 25 MG tablet, Take 1 tablet (25 mg total) by mouth daily., Disp: 90 tablet, Rfl: 3   torsemide 40 MG TABS, Take 80 mg by mouth 2 (two) times daily., Disp: 360 tablet, Rfl: 3   triamcinolone ointment (KENALOG) 0.1 %, Apply topically 2 (two) times daily., Disp: 454 g, Rfl: 0   VITAMIN E  PO, Take 1 tablet by mouth daily., Disp: , Rfl:  Allergies  Allergen Reactions   Gabapentin Nausea And Vomiting and Other (See Comments)    POSSIBLE SHAKING   Lyrica [Pregabalin] Other (See Comments)    Shaking       Social History   Socioeconomic History   Marital status: Single    Spouse name: Not on file   Number of children: 1   Years of education: 12   Highest education level: 12th grade  Occupational History   Occupation: disabled  Tobacco Use   Smoking status: Every Day    Current packs/day: 1.00    Average packs/day: 1 pack/day for 44.0 years (44.0 ttl pk-yrs)    Types: E-cigarettes, Cigarettes   Smokeless tobacco: Former    Types: Snuff  Vaping Use   Vaping status: Former  Devices: 11/26/2018 "stopped months ago"  Substance and Sexual Activity   Alcohol use: Not Currently    Comment: occ   Drug use: Not Currently    Types: "Crack" cocaine, Marijuana, Oxycodone   Sexual activity: Not Currently  Other Topics Concern   Not on file  Social History Narrative   ** Merged History Encounter **       Lives in [place], in motel with sister.  They are looking to move but don't have a place to go yet.     Social Drivers of Corporate investment banker Strain: Low Risk  (02/09/2023)   Overall Financial Resource Strain (CARDIA)    Difficulty of Paying Living Expenses: Not hard at all  Food Insecurity: No Food Insecurity (10/10/2023)   Hunger Vital Sign    Worried About Running Out of Food in the Last Year: Never true    Ran Out of Food in the Last Year: Never true  Transportation Needs: No Transportation Needs (10/10/2023)   PRAPARE - Administrator, Civil Service (Medical): No    Lack of Transportation (Non-Medical): No  Physical Activity: Inactive (02/09/2023)   Exercise Vital Sign    Days of Exercise per Week: 0 days    Minutes of Exercise per Session: 0 min  Stress: No Stress Concern Present (02/09/2023)   [employer] of Occupational Health -  Occupational Stress Questionnaire    Feeling of Stress : Not at all  Social Connections: Unknown (08/21/2023)   Received from [facility]   Social Network    Social Network: Not on file  Intimate Partner Violence: Not At Risk (10/10/2023)   Humiliation, Afraid, Rape, and Kick questionnaire    Fear of Current or Ex-Partner: No    Emotionally Abused: No    Physically Abused: No    Sexually Abused: No    Physical Exam      Future Appointments  Date Time Provider Department Center  01/22/2024 10:00 AM LB ENDO/NEURO LAB [facility] None  01/25/2024  2:00 PM Altamese [place], MD [facility] None  02/08/2024 11:30 AM [facility] PA/NP [facility] None  02/13/2024  2:20 PM [facility] ANNUAL WELLNESS VISIT [facility] None  04/09/2024  3:00 PM [facility] HS VASC 4 [facility] V[facility]  04/09/2024  4:00 PM Leonie Douglas, MD [facility]       Kerry Hough, Paramedic [phone] [facility] Paramedic  01/11/24

## 2024-01-15 ENCOUNTER — Telehealth (HOSPITAL_COMMUNITY): Payer: Self-pay

## 2024-01-15 ENCOUNTER — Other Ambulatory Visit: Payer: Self-pay

## 2024-01-15 DIAGNOSIS — E1169 Type 2 diabetes mellitus with other specified complication: Secondary | ICD-10-CM

## 2024-01-15 NOTE — Telephone Encounter (Signed)
 Contacted [facility] to request the following for refills:  -pantoprazole -metoprolol -spiro -metolazone   Kerry Hough, Paramedic [phone] 01/15/24

## 2024-01-17 ENCOUNTER — Telehealth (HOSPITAL_COMMUNITY): Payer: Self-pay

## 2024-01-17 NOTE — Telephone Encounter (Signed)
 Called pt several times today with no answer.  I went to apt to see if home, sisters car was gone -I think I passed them on the road not far from their apt as I was heading there, but knocked on the door and no answer.  Will try again tomor.   Kerry Hough, EMT-Paramedic  [phone] 01/17/2024

## 2024-01-18 ENCOUNTER — Other Ambulatory Visit (HOSPITAL_COMMUNITY): Payer: Self-pay

## 2024-01-18 NOTE — Progress Notes (Signed)
 Paramedicine Encounter    Patient ID: [name], female    DOB: 12-23-1960, 63 y.o.   MRN: [id number]   Complaints-feet hurt /chronic pain   Edema-slight   Compliance with meds-yes  Pill box filled-yes  If so, by whom-paramedic   Refills needed-metolazone-called pharm to get that sent out tomor. She is advised to place it in for Monday.   Pt reports she is doing ok. She missed her PCP appoint today but got it resch for later in the month.  She had been wearing compression stockings but had to take off, they began to hurt her legs.  She reports legs are beginning to look more red. Does not feel hot to touch. They do seem a bit more red than usual. Advised her to continue to pay attention and see if her PCP can see her asap.  Told her she may have to go to urgent care if not able to be seen and that redness gets worse.  Pt denies increased sob.  Slight wheeze to rt upper/lower and left lower. She used inhaler while I was there.  Meds verified and pill box refilled minus the metolazone which she will place.  I will gather her meds next Monday to take to pharmacy to start bubble packs.  She has stabled back out now and good to go to start that process.    BP 124/60   Pulse 66   Resp 18   Wt 207 lb (93.9 kg)   LMP  (LMP Unknown)   SpO2 97%   BMI 35.53 kg/m  Weight yesterday-207 Last visit weight-205  Patient Care Team: Hillery Aldo, NP as PCP - General Allyson Sabal Delton See, MD as PCP - Cardiology (Cardiology) Laurey Morale, MD as PCP - Advanced Heart Failure (Cardiology) Regan Lemming, MD as PCP - Electrophysiology (Cardiology) Runell Gess, MD as Consulting Physician (Cardiology) Nyoka Cowden, MD as Consulting Physician (Pulmonary Disease) System, Provider Not In  Patient Active Problem List   Diagnosis Date Noted   Heart failure ([name]) 10/11/2023   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) and diastolic (congestive) heart failure ([name])  10/10/2023   Pre-ulcerative calluses 04/25/2022   COPD (chronic obstructive pulmonary disease) ([name]) 05/05/2021   OSA (obstructive sleep apnea) 05/05/2021   Stage 3b chronic kidney disease ([name]) 05/05/2021   Pressure injury of skin 05/04/2021   Diabetic nephropathy ([name]) 01/02/2021   Low back pain 06/01/2020   Hyperlipidemia 04/03/2020   PAD (peripheral artery disease) ([name]) 12/26/2019   NICM (nonischemic cardiomyopathy) ([name]) 06/20/2019   Non-healing ulcer ([name]) 06/20/2019   Coagulation disorder ([name]) 08/09/2017   Depression 07/21/2017   At risk for adverse drug reaction 06/20/2017   Peripheral neuropathy 06/20/2017   S/P transmetatarsal amputation of foot, right ([name]) 06/05/2017   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) 05/19/2017   Obesity, class 2 02/24/2016   Encounter for therapeutic drug monitoring 02/10/2016   Symptomatic bradycardia 01/12/2016   Essential hypertension 12/22/2015   Elevated troponin    Acute on chronic diastolic CHF (congestive heart failure) ([name])    Anemia of chronic disease 11/08/2015   Hypokalemia 11/08/2015   Tobacco abuse 10/23/2015   Coronary artery disease    DOE (dyspnea on exertion) 04/29/2015   PAF (paroxysmal atrial fibrillation) ([name]) 01/16/2015   Carotid arterial disease ([name]) 01/16/2015   Insomnia 02/03/2014   S/P peripheral artery angioplasty - TurboHawk atherectomy; R SFA 09/11/2013    Class: Acute  Leg pain, bilateral 08/19/2013   Hypothyroidism 07/31/2013   History of cocaine abuse ([name]) 06/13/2013   Long term current use of anticoagulant therapy 05/20/2013   Alcohol abuse    Narcotic abuse ([name])    Marijuana abuse    Alcoholic cirrhosis ([name])    Type 2 diabetes mellitus with hyperlipidemia ([name])     Current Outpatient Medications:    acetaminophen (TYLENOL) 500 MG tablet, Take 1,000 mg by mouth every 6 (six) hours as needed for headache (pain)., Disp: , Rfl:     albuterol (VENTOLIN HFA) 108 (90 Base) MCG/ACT inhaler, USE 2 PUFFS BY MOUTH EVERY FOUR HOURS, AS NEEDED, FOR COUGHING/WHEEZING, Disp: 8.5 g, Rfl: 0   allopurinol (ZYLOPRIM) 100 MG tablet, Take 150 mg by mouth daily., Disp: , Rfl:    apixaban (ELIQUIS) 5 MG TABS tablet, Take 1 tablet (5 mg total) by mouth 2 (two) times daily., Disp: 60 tablet, Rfl: 6   budesonide-formoterol (SYMBICORT) 160-4.5 MCG/ACT inhaler, Inhale 2 puffs into the lungs 2 (two) times daily., Disp: 1 each, Rfl: 2   buPROPion ER (WELLBUTRIN SR) 100 MG 12 hr tablet, Take 1 tablet (100 mg total) by mouth daily., Disp: 30 tablet, Rfl: 6   Cholecalciferol (VITAMIN D3 PO), Take 1 capsule by mouth every morning., Disp: , Rfl:    levothyroxine (SYNTHROID) 50 MCG tablet, TAKE 1 TABLET (50 MCG TOTAL) BY MOUTH DAILY BEFORE BREAKFAST (AM), Disp: 30 tablet, Rfl: 0   losartan (COZAAR) 25 MG tablet, Take 1 tablet (25 mg total) by mouth daily., Disp: 30 tablet, Rfl: 0   metolazone (ZAROXOLYN) 2.5 MG tablet, Take 1 tablet (2.5 mg total) by mouth once a week. Every Monday, Disp: 15 tablet, Rfl: 3   metoprolol succinate (TOPROL-XL) 25 MG 24 hr tablet, Take 1 tablet (25 mg total) by mouth daily., Disp: 90 tablet, Rfl: 3   pantoprazole (PROTONIX) 40 MG tablet, TAKE 1 TABLET (40 MG TOTAL) BY MOUTH DAILY(AM), Disp: 30 tablet, Rfl: 0   potassium chloride SA (KLOR-CON M) 20 MEQ tablet, Take 2 tablets (40 mEq total) by mouth once a week. With every dose of metolazone, Disp: , Rfl:    rosuvastatin (CRESTOR) 40 MG tablet, TAKE 1 TABLET (40 MG TOTAL) BY MOUTH DAILY (Patient taking differently: Take 40 mg by mouth at bedtime.), Disp: 15 tablet, Rfl: 0   spironolactone (ALDACTONE) 25 MG tablet, Take 1 tablet (25 mg total) by mouth daily., Disp: 90 tablet, Rfl: 3   torsemide 40 MG TABS, Take 80 mg by mouth 2 (two) times daily., Disp: 360 tablet, Rfl: 3   VITAMIN E PO, Take 1 tablet by mouth daily., Disp: , Rfl:    ACCU-CHEK GUIDE test strip, USE TO CHECK BLOOD  SUGAR FOUR TIMES DAILY, Disp: , Rfl:    colchicine 0.6 MG tablet, Take 1.2 mg by mouth See admin instructions. Take 1.2 mg for flair up and an hour later take 0.6 mg if needed, Disp: , Rfl:    EASY COMFORT PEN NEEDLES 31G X 5 MM MISC, USE FOUR TIMES A DAY FOR INSULIN ADMINISTRATION, Disp: 100 each, Rfl: 1   fluticasone (FLONASE) 50 MCG/ACT nasal spray, Place 1 spray into both nostrils as needed for allergies or rhinitis., Disp: , Rfl:    Furosemide (FUROSCIX [place]), Inject into the skin as directed. As needed (Patient not taking: Reported on 01/03/2024), Disp: , Rfl:    insulin lispro (HUMALOG) 100 UNIT/ML KwikPen, Inject 10 Units into the skin 3 (three) times daily with meals., Disp:  15 mL, Rfl: 0   LANTUS SOLOSTAR 100 UNIT/ML Solostar Pen, Inject 37 Units into the skin daily. (Patient taking differently: Inject 40 Units into the skin daily.), Disp: 15 mL, Rfl: 0   Omega-3 Fatty Acids (FISH OIL PO), Take 1 tablet by mouth daily. (Patient not taking: Reported on 12/12/2023), Disp: , Rfl:    polyethylene glycol (MIRALAX / GLYCOLAX) 17 g packet, Take 17 g by mouth daily as needed for moderate constipation., Disp: 14 each, Rfl: 0   triamcinolone ointment (KENALOG) 0.1 %, Apply topically 2 (two) times daily., Disp: 454 g, Rfl: 0 Allergies  Allergen Reactions   Gabapentin Nausea And Vomiting and Other (See Comments)    POSSIBLE SHAKING   Lyrica [Pregabalin] Other (See Comments)    Shaking       Social History   Socioeconomic History   Marital status: Single    Spouse name: Not on file   Number of children: 1   Years of education: 12   Highest education level: 12th grade  Occupational History   Occupation: disabled  Tobacco Use   Smoking status: Every Day    Current packs/day: 1.00    Average packs/day: 1 pack/day for 44.0 years (44.0 ttl pk-yrs)    Types: E-cigarettes, Cigarettes   Smokeless tobacco: Former    Types: Snuff  Vaping Use   Vaping status: Former   Devices: 11/26/2018  "stopped months ago"  Substance and Sexual Activity   Alcohol use: Not Currently    Comment: occ   Drug use: Not Currently    Types: "Crack" cocaine, Marijuana, Oxycodone   Sexual activity: Not Currently  Other Topics Concern   Not on file  Social History Narrative   ** Merged History Encounter **       Lives in [place], in motel with sister.  They are looking to move but don't have a place to go yet.     Social Drivers of Corporate investment banker Strain: Low Risk  (02/09/2023)   Overall Financial Resource Strain (CARDIA)    Difficulty of Paying Living Expenses: Not hard at all  Food Insecurity: No Food Insecurity (10/10/2023)   Hunger Vital Sign    Worried About Running Out of Food in the Last Year: Never true    Ran Out of Food in the Last Year: Never true  Transportation Needs: No Transportation Needs (10/10/2023)   PRAPARE - Administrator, Civil Service (Medical): No    Lack of Transportation (Non-Medical): No  Physical Activity: Inactive (02/09/2023)   Exercise Vital Sign    Days of Exercise per Week: 0 days    Minutes of Exercise per Session: 0 min  Stress: No Stress Concern Present (02/09/2023)   [employer] of Occupational Health - Occupational Stress Questionnaire    Feeling of Stress : Not at all  Social Connections: Unknown (08/21/2023)   Received from [facility]   Social Network    Social Network: Not on file  Intimate Partner Violence: Not At Risk (10/10/2023)   Humiliation, Afraid, Rape, and Kick questionnaire    Fear of Current or Ex-Partner: No    Emotionally Abused: No    Physically Abused: No    Sexually Abused: No    Physical Exam      Future Appointments  Date Time Provider Department Center  01/22/2024 10:00 AM [facility]UR[facility] LAB [facility] None  01/25/2024  2:00 PM Altamese [place], MD [facility] None  02/08/2024 11:30 AM [facility] PA/NP [facility] None  02/13/2024  2:20 PM [facility] ANNUAL WELLNESS VISIT [facility] None  04/09/2024   3:00 PM [facility] VASC 4 [facility]  04/09/2024  4:00 PM Leonie Douglas, MD [facility]       Kerry Hough, Paramedic [phone] [facility] Paramedic  01/18/24

## 2024-01-22 ENCOUNTER — Other Ambulatory Visit: Payer: 59

## 2024-01-23 ENCOUNTER — Other Ambulatory Visit

## 2024-01-23 ENCOUNTER — Other Ambulatory Visit (HOSPITAL_COMMUNITY): Payer: Self-pay

## 2024-01-23 NOTE — Progress Notes (Signed)
 Paramedicine Encounter    Patient ID: [name], female    DOB: 01-12-1961, 63 y.o.   MRN: [id number]   Complaints-n/v X 2-3 days   Edema-slight-chronic   Compliance with meds-yes  Pill box filled-yes refilled for one week  If so, by whom-paramedic   Refills needed-took all meds to pharm to get her going on bubble packs    Pt reports she is having n/v for 2-3 days after she eats.  No diarrhea, no abd pains, appetite ok, she just feels bubbly in her stomach and yucky.  No dizziness, no sob. No c/p.  Taking all meds for most part. She missed yesterday afternoon torsemide dose. She was out and about running errands with her sister.   I refilled pill box back up, and going to take all meds to pharmacy to get them to start her back on bubble packs. (Meds were taken to pharm and gave to pharmacist with list)  She went to have labs done today but will monitor if she reports any changes from that. I am not sure who ordered those labs.    She reports CBG's have been elevated lately. She has appoint with PCP soon to discuss further on that. Advised her to monitor what she is eating and drinking.     CBG PTA-341 BP 130/64   Pulse 74   Resp 18   Wt 209 lb (94.8 kg)   LMP  (LMP Unknown)   SpO2 97%   BMI 35.87 kg/m  Weight yesterday--208 Last visit weight-207  Patient Care Team: Hillery Aldo, NP as PCP - General Allyson Sabal Delton See, MD as PCP - Cardiology (Cardiology) Laurey Morale, MD as PCP - Advanced Heart Failure (Cardiology) Regan Lemming, MD as PCP - Electrophysiology (Cardiology) Runell Gess, MD as Consulting Physician (Cardiology) Nyoka Cowden, MD as Consulting Physician (Pulmonary Disease) System, Provider Not In  Patient Active Problem List   Diagnosis Date Noted   Heart failure ([name]) 10/11/2023   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) and diastolic (congestive) heart failure ([name]) 10/10/2023   Pre-ulcerative calluses 04/25/2022   COPD  (chronic obstructive pulmonary disease) ([name]) 05/05/2021   OSA (obstructive sleep apnea) 05/05/2021   Stage 3b chronic kidney disease ([name]) 05/05/2021   Pressure injury of skin 05/04/2021   Diabetic nephropathy ([name]) 01/02/2021   Low back pain 06/01/2020   Hyperlipidemia 04/03/2020   PAD (peripheral artery disease) ([name]) 12/26/2019   NICM (nonischemic cardiomyopathy) ([name]) 06/20/2019   Non-healing ulcer ([name]) 06/20/2019   Coagulation disorder ([name]) 08/09/2017   Depression 07/21/2017   At risk for adverse drug reaction 06/20/2017   Peripheral neuropathy 06/20/2017   S/P transmetatarsal amputation of foot, right ([name]) 06/05/2017   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) 05/19/2017   Obesity, class 2 02/24/2016   Encounter for therapeutic drug monitoring 02/10/2016   Symptomatic bradycardia 01/12/2016   Essential hypertension 12/22/2015   Elevated troponin    Acute on chronic diastolic CHF (congestive heart failure) ([name])    Anemia of chronic disease 11/08/2015   Hypokalemia 11/08/2015   Tobacco abuse 10/23/2015   Coronary artery disease    DOE (dyspnea on exertion) 04/29/2015   PAF (paroxysmal atrial fibrillation) ([name]) 01/16/2015   Carotid arterial disease ([name]) 01/16/2015   Insomnia 02/03/2014   S/P peripheral artery angioplasty - TurboHawk atherectomy; R SFA 09/11/2013    Class: Acute   Leg pain, bilateral 08/19/2013   Hypothyroidism 07/31/2013   History  of cocaine abuse ([name]) 06/13/2013   Long term current use of anticoagulant therapy 05/20/2013   Alcohol abuse    Narcotic abuse ([name])    Marijuana abuse    Alcoholic cirrhosis ([name])    Type 2 diabetes mellitus with hyperlipidemia ([name])     Current Outpatient Medications:    ACCU-CHEK GUIDE test strip, USE TO CHECK BLOOD SUGAR FOUR TIMES DAILY, Disp: , Rfl:    acetaminophen (TYLENOL) 500 MG tablet, Take 1,000 mg by mouth every 6 (six) hours as needed for headache  (pain)., Disp: , Rfl:    albuterol (VENTOLIN HFA) 108 (90 Base) MCG/ACT inhaler, USE 2 PUFFS BY MOUTH EVERY FOUR HOURS, AS NEEDED, FOR COUGHING/WHEEZING, Disp: 8.5 g, Rfl: 0   allopurinol (ZYLOPRIM) 100 MG tablet, Take 150 mg by mouth daily., Disp: , Rfl:    apixaban (ELIQUIS) 5 MG TABS tablet, Take 1 tablet (5 mg total) by mouth 2 (two) times daily., Disp: 60 tablet, Rfl: 6   budesonide-formoterol (SYMBICORT) 160-4.5 MCG/ACT inhaler, Inhale 2 puffs into the lungs 2 (two) times daily., Disp: 1 each, Rfl: 2   buPROPion ER (WELLBUTRIN SR) 100 MG 12 hr tablet, Take 1 tablet (100 mg total) by mouth daily., Disp: 30 tablet, Rfl: 6   Cholecalciferol (VITAMIN D3 PO), Take 1 capsule by mouth every morning., Disp: , Rfl:    colchicine 0.6 MG tablet, Take 1.2 mg by mouth See admin instructions. Take 1.2 mg for flair up and an hour later take 0.6 mg if needed, Disp: , Rfl:    EASY COMFORT PEN NEEDLES 31G X 5 MM MISC, USE FOUR TIMES A DAY FOR INSULIN ADMINISTRATION, Disp: 100 each, Rfl: 1   insulin lispro (HUMALOG) 100 UNIT/ML KwikPen, Inject 10 Units into the skin 3 (three) times daily with meals., Disp: 15 mL, Rfl: 0   LANTUS SOLOSTAR 100 UNIT/ML Solostar Pen, Inject 37 Units into the skin daily. (Patient taking differently: Inject 40 Units into the skin daily.), Disp: 15 mL, Rfl: 0   levothyroxine (SYNTHROID) 50 MCG tablet, TAKE 1 TABLET (50 MCG TOTAL) BY MOUTH DAILY BEFORE BREAKFAST (AM), Disp: 30 tablet, Rfl: 0   losartan (COZAAR) 25 MG tablet, Take 1 tablet (25 mg total) by mouth daily., Disp: 30 tablet, Rfl: 0   metolazone (ZAROXOLYN) 2.5 MG tablet, Take 1 tablet (2.5 mg total) by mouth once a week. Every Monday, Disp: 15 tablet, Rfl: 3   metoprolol succinate (TOPROL-XL) 25 MG 24 hr tablet, Take 1 tablet (25 mg total) by mouth daily., Disp: 90 tablet, Rfl: 3   Omega-3 Fatty Acids (FISH OIL PO), Take 1 tablet by mouth daily., Disp: , Rfl:    pantoprazole (PROTONIX) 40 MG tablet, TAKE 1 TABLET (40 MG TOTAL)  BY MOUTH DAILY(AM), Disp: 30 tablet, Rfl: 0   potassium chloride SA (KLOR-CON M) 20 MEQ tablet, Take 2 tablets (40 mEq total) by mouth once a week. With every dose of metolazone, Disp: , Rfl:    rosuvastatin (CRESTOR) 40 MG tablet, TAKE 1 TABLET (40 MG TOTAL) BY MOUTH DAILY (Patient taking differently: Take 40 mg by mouth at bedtime.), Disp: 15 tablet, Rfl: 0   spironolactone (ALDACTONE) 25 MG tablet, Take 1 tablet (25 mg total) by mouth daily., Disp: 90 tablet, Rfl: 3   torsemide 40 MG TABS, Take 80 mg by mouth 2 (two) times daily., Disp: 360 tablet, Rfl: 3   triamcinolone ointment (KENALOG) 0.1 %, Apply topically 2 (two) times daily., Disp: 454 g, Rfl: 0   VITAMIN  E PO, Take 1 tablet by mouth daily., Disp: , Rfl:    fluticasone (FLONASE) 50 MCG/ACT nasal spray, Place 1 spray into both nostrils as needed for allergies or rhinitis. (Patient not taking: Reported on 01/23/2024), Disp: , Rfl:    Furosemide (FUROSCIX [place]), Inject into the skin as directed. As needed (Patient not taking: Reported on 01/23/2024), Disp: , Rfl:    polyethylene glycol (MIRALAX / GLYCOLAX) 17 g packet, Take 17 g by mouth daily as needed for moderate constipation. (Patient not taking: Reported on 01/23/2024), Disp: 14 each, Rfl: 0 Allergies  Allergen Reactions   Gabapentin Nausea And Vomiting and Other (See Comments)    POSSIBLE SHAKING   Lyrica [Pregabalin] Other (See Comments)    Shaking       Social History   Socioeconomic History   Marital status: Single    Spouse name: Not on file   Number of children: 1   Years of education: 12   Highest education level: 12th grade  Occupational History   Occupation: disabled  Tobacco Use   Smoking status: Every Day    Current packs/day: 1.00    Average packs/day: 1 pack/day for 44.0 years (44.0 ttl pk-yrs)    Types: E-cigarettes, Cigarettes   Smokeless tobacco: Former    Types: Snuff  Vaping Use   Vaping status: Former   Devices: 11/26/2018 "stopped months ago"   Substance and Sexual Activity   Alcohol use: Not Currently    Comment: occ   Drug use: Not Currently    Types: "Crack" cocaine, Marijuana, Oxycodone   Sexual activity: Not Currently  Other Topics Concern   Not on file  Social History Narrative   ** Merged History Encounter **       Lives in [place], in motel with sister.  They are looking to move but don't have a place to go yet.     Social Drivers of Corporate investment banker Strain: Low Risk  (02/09/2023)   Overall Financial Resource Strain (CARDIA)    Difficulty of Paying Living Expenses: Not hard at all  Food Insecurity: No Food Insecurity (10/10/2023)   Hunger Vital Sign    Worried About Running Out of Food in the Last Year: Never true    Ran Out of Food in the Last Year: Never true  Transportation Needs: No Transportation Needs (10/10/2023)   PRAPARE - Administrator, Civil Service (Medical): No    Lack of Transportation (Non-Medical): No  Physical Activity: Inactive (02/09/2023)   Exercise Vital Sign    Days of Exercise per Week: 0 days    Minutes of Exercise per Session: 0 min  Stress: No Stress Concern Present (02/09/2023)   [employer] of Occupational Health - Occupational Stress Questionnaire    Feeling of Stress : Not at all  Social Connections: Unknown (08/21/2023)   Received from [facility]   Social Network    Social Network: Not on file  Intimate Partner Violence: Not At Risk (10/10/2023)   Humiliation, Afraid, Rape, and Kick questionnaire    Fear of Current or Ex-Partner: No    Emotionally Abused: No    Physically Abused: No    Sexually Abused: No    Physical Exam      Future Appointments  Date Time Provider Department Center  01/25/2024  2:00 PM Altamese Tivoli, MD [facility] None  02/08/2024 11:30 AM [facility] PA/NP [facility] None  02/13/2024  2:20 PM [facility] ANNUAL WELLNESS VISIT [facility] None  04/09/2024  3:00  PM [facility] HS VASC 4 [facility]  04/09/2024  4:00 PM Leonie Douglas, MD  [facility]       Kerry Hough, Paramedic [phone] [facility] Paramedic  01/23/24

## 2024-01-24 ENCOUNTER — Telehealth: Payer: Self-pay | Admitting: Family Medicine

## 2024-01-24 LAB — MICROALBUMIN / CREATININE URINE RATIO
Creatinine, Urine: 99 mg/dL (ref 20–275)
Microalb Creat Ratio: 91 mg/g{creat} — ABNORMAL HIGH (ref <30)
Microalb, Ur: 9 mg/dL

## 2024-01-24 LAB — LIPID PANEL
Cholesterol: 189 mg/dL (ref <200)
HDL: 47 mg/dL — ABNORMAL LOW (ref >=50)
LDL Cholesterol (Calc): 91 mg/dL
Non-HDL Cholesterol (Calc): 142 mg/dL — ABNORMAL HIGH (ref <130)
Total CHOL/HDL Ratio: 4 (calc) (ref <5.0)
Triglycerides: 384 mg/dL — ABNORMAL HIGH (ref <150)

## 2024-01-24 LAB — HEMOGLOBIN A1C
Hgb A1c MFr Bld: 12.6 %{Hb} — ABNORMAL HIGH (ref <5.7)
Mean Plasma Glucose: 315 mg/dL
eAG (mmol/L): 17.4 mmol/L

## 2024-01-24 LAB — COMPREHENSIVE METABOLIC PANEL
AG Ratio: 1.3 (calc) (ref 1.0–2.5)
ALT: 23 U/L (ref 6–29)
AST: 19 U/L (ref 10–35)
Albumin: 3.8 g/dL (ref 3.6–5.1)
Alkaline phosphatase (APISO): 104 U/L (ref 37–153)
BUN/Creatinine Ratio: 39 (calc) — ABNORMAL HIGH (ref 6–22)
BUN: 79 mg/dL — ABNORMAL HIGH (ref 7–25)
CO2: 27 mmol/L (ref 20–32)
Calcium: 9.9 mg/dL (ref 8.6–10.4)
Chloride: 96 mmol/L — ABNORMAL LOW (ref 98–110)
Creat: 2.01 mg/dL — ABNORMAL HIGH (ref 0.50–1.05)
Globulin: 3 g/dL (ref 1.9–3.7)
Glucose, Bld: 262 mg/dL — ABNORMAL HIGH (ref 65–99)
Potassium: 3.9 mmol/L (ref 3.5–5.3)
Sodium: 138 mmol/L (ref 135–146)
Total Bilirubin: 0.3 mg/dL (ref 0.2–1.2)
Total Protein: 6.8 g/dL (ref 6.1–8.1)

## 2024-01-24 LAB — C-PEPTIDE: C-Peptide: 3.8 ng/mL (ref 0.80–3.85)

## 2024-01-24 NOTE — Telephone Encounter (Signed)
 A document form has been faxed: Home Health Certificate (Order ID [id number]), to be filled out by provider. Send document back via Fax within 5-days. Document is located in providers tray at front office.           Fax number:  [phone]

## 2024-01-25 ENCOUNTER — Other Ambulatory Visit (HOSPITAL_COMMUNITY): Payer: Self-pay | Admitting: Adult Health

## 2024-01-25 ENCOUNTER — Ambulatory Visit (INDEPENDENT_AMBULATORY_CARE_PROVIDER_SITE_OTHER): Payer: 59 | Admitting: "Endocrinology

## 2024-01-25 ENCOUNTER — Telehealth (HOSPITAL_COMMUNITY): Payer: Self-pay | Admitting: Cardiology

## 2024-01-25 ENCOUNTER — Encounter: Payer: Self-pay | Admitting: "Endocrinology

## 2024-01-25 ENCOUNTER — Telehealth: Payer: Self-pay | Admitting: *Deleted

## 2024-01-25 VITALS — BP 120/70 | HR 65 | Ht 64.0 in | Wt 211.0 lb

## 2024-01-25 DIAGNOSIS — Z794 Long term (current) use of insulin: Secondary | ICD-10-CM

## 2024-01-25 DIAGNOSIS — E782 Mixed hyperlipidemia: Secondary | ICD-10-CM

## 2024-01-25 DIAGNOSIS — E1165 Type 2 diabetes mellitus with hyperglycemia: Secondary | ICD-10-CM | POA: Diagnosis not present

## 2024-01-25 MED ORDER — LOSARTAN POTASSIUM 25 MG PO TABS: 25.0000 mg | ORAL_TABLET | Freq: Every day | ORAL | 11 refills | Status: DC

## 2024-01-25 MED ORDER — POTASSIUM CHLORIDE CRYS ER 20 MEQ PO TBCR: 40.0000 meq | EXTENDED_RELEASE_TABLET | ORAL | 11 refills | Status: DC

## 2024-01-25 MED ORDER — FREESTYLE LIBRE 3 READER DEVI: 1.0000 | 0 refills | Status: DC

## 2024-01-25 MED ORDER — FREESTYLE LIBRE 3 PLUS SENSOR MISC: 3 refills | Status: DC

## 2024-01-25 NOTE — Patient Instructions (Signed)
 Will recommend the following: Lantus 38 units at bedtime, increase by 1 unit unless fasting blood sugar is in 120s Humalog 10 units tid 15 min before meals

## 2024-01-25 NOTE — Telephone Encounter (Signed)
 rx Received: Today [name], Paramedic  P [facility],  We are moving her to bubble packs but the [facility] needs an updated rx for the recent instructions so he can place those correctly.   He needs the Potassium and the Losartan rx please.  Thanks!  [name], EMT-Paramedic [phone] 01/25/2024

## 2024-01-25 NOTE — Telephone Encounter (Signed)
 Noted.

## 2024-01-25 NOTE — Telephone Encounter (Signed)
 Rx sent

## 2024-01-25 NOTE — Progress Notes (Signed)
 Outpatient Endocrinology Note Altamese [place], MD  01/25/24   ANNELL CANTY 1961/04/19 [id number]  Referring Provider: Hillery Aldo, NP Primary Care Provider: Hillery Aldo, NP Reason for consultation: Subjective   Assessment & Plan  Diagnoses and all orders for this visit:  Uncontrolled type 2 diabetes mellitus with hyperglycemia ([name]) -     Ambulatory referral to diabetic education  Long-term insulin use ([name])  Mixed hypercholesterolemia and hypertriglyceridemia  Other orders -     Continuous Glucose Sensor (FREESTYLE LIBRE 3 PLUS SENSOR) MISC; Change sensor every 15 days. -     Continuous Glucose Receiver (FREESTYLE LIBRE 3 READER) DEVI; 1 Device by Does not apply route continuous.    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, , No results found for: "GFR" Hba1c goal less than 7, current Hba1c is  Lab Results  Component Value Date   HGBA1C 12.6 (H) 01/23/2024   Will recommend the following: Lantus 38 units at bedtime, increase by 1 unit unless fasting blood sugar is in 120s Humalog 10 units tid 15 min before meals Ordered CGM and applied one Check BG TIDAC   No known contraindications/side effects to any of above medications Glucagon discussed and prescribed with refills on 01/25/24    -Last LD and Tg are as follows: Lab Results  Component Value Date   LDLCALC 91 01/23/2024    Lab Results  Component Value Date   TRIG 384 (H) 01/23/2024   -On rosuvastatin 40 mg QD -Follow low fat diet and exercise   -Blood pressure goal <140/90 - Microalbumin/creatinine goal is < 30 -Last MA/Cr is as follows: Lab Results  Component Value Date   MICROALBUR 9.0 01/23/2024   -on ACE/ARB losartan 25 mg every day  -diet changes including salt restriction -limit eating outside -counseled BP targets per standards of diabetes care -uncontrolled blood pressure can lead to retinopathy, nephropathy and cardiovascular and atherosclerotic heart disease  Reviewed and  counseled on: -A1C target -Blood sugar targets -Complications of uncontrolled diabetes  -Checking blood sugar before meals and bedtime and bring log next visit -All medications with mechanism of action and side effects -Hypoglycemia management: rule of 15's, Glucagon Emergency Kit and medical alert ID -low-carb low-fat plate-method diet -At least 20 minutes of physical activity per day -Annual dilated retinal eye exam and foot exam -compliance and follow up needs -follow up as scheduled or earlier if problem gets worse  Call if blood sugar is less than 70 or consistently above 250    Take a 15 gm snack of carbohydrate at bedtime before you go to sleep if your blood sugar is less than 100.    If you are going to fast after midnight for a test or procedure, ask your physician for instructions on how to reduce/decrease your insulin dose.    Call if blood sugar is less than 70 or consistently above 250  -Treating a low sugar by rule of 15  (15 gms of sugar every 15 min until sugar is more than 70) If you feel your sugar is low, test your sugar to be sure If your sugar is low (less than 70), then take 15 grams of a fast acting Carbohydrate (3-4 glucose tablets or glucose gel or 4 ounces of juice or regular soda) Recheck your sugar 15 min after treating low to make sure it is more than 70 If sugar is still less than 70, treat again with 15 grams of carbohydrate  Don't drive the hour of hypoglycemia  If unconscious/unable to eat or drink by mouth, use glucagon injection or nasal spray baqsimi and call 911. Can repeat again in 15 min if still unconscious.  Return in about 11 days (around 02/05/2024).   I have reviewed current medications, nurse's notes, allergies, vital signs, past medical and surgical history, family medical history, and social history for this encounter. Counseled patient on symptoms, examination findings, lab findings, imaging results, treatment decisions and  monitoring and prognosis. The patient understood the recommendations and agrees with the treatment plan. All questions regarding treatment plan were fully answered.  Altamese [place], MD  01/25/24    History of Present Illness MAKENNA MACALUSO is a 63 y.o. year old female who presents for evaluation of Type II diabetes mellitus.  YVANNA VIDAS was first diagnosed around 2014.   Diabetes education +  Home diabetes regimen: Lantus 38 units at bedtime Humalog 10 units tid  COMPLICATIONS -  MI/Stroke +  retinopathy +  neuropathy +  nephropathy  SYMPTOMS REVIEWED + Polyuria - Weight loss + Blurred vision  BLOOD SUGAR DATA Did not bring meter Reports checking 3-4 times a day Recall 114-600   Physical Exam  BP 120/70   Pulse 65   Ht 5\' 4"  (1.626 m)   Wt 211 lb (95.7 kg)   LMP  (LMP Unknown)   SpO2 97%   BMI 36.22 kg/m    Constitutional: well developed, well nourished Head: normocephalic, atraumatic Eyes: sclera anicteric, no redness Neck: supple Lungs: normal respiratory effort Neurology: alert and oriented Skin: dry, no appreciable rashes Musculoskeletal: no appreciable defects Psychiatric: normal mood and affect Diabetic Foot Exam - Simple   No data filed      Current Medications Patient's Medications  New Prescriptions   CONTINUOUS GLUCOSE RECEIVER (FREESTYLE LIBRE 3 READER) DEVI    1 Device by Does not apply route continuous.   CONTINUOUS GLUCOSE SENSOR (FREESTYLE LIBRE 3 PLUS SENSOR) MISC    Change sensor every 15 days.  Previous Medications   ACCU-CHEK GUIDE TEST STRIP    USE TO CHECK BLOOD SUGAR FOUR TIMES DAILY   ACETAMINOPHEN (TYLENOL) 500 MG TABLET    Take 1,000 mg by mouth every 6 (six) hours as needed for headache (pain).   ALBUTEROL (VENTOLIN HFA) 108 (90 BASE) MCG/ACT INHALER    USE 2 PUFFS BY MOUTH EVERY FOUR HOURS, AS NEEDED, FOR COUGHING/WHEEZING   ALLOPURINOL (ZYLOPRIM) 100 MG TABLET    Take 150 mg by mouth daily.   APIXABAN (ELIQUIS) 5 MG TABS  TABLET    Take 1 tablet (5 mg total) by mouth 2 (two) times daily.   BUDESONIDE-FORMOTEROL (SYMBICORT) 160-4.5 MCG/ACT INHALER    Inhale 2 puffs into the lungs 2 (two) times daily.   BUPROPION ER (WELLBUTRIN SR) 100 MG 12 HR TABLET    Take 1 tablet (100 mg total) by mouth daily.   CHOLECALCIFEROL (VITAMIN D3 PO)    Take 1 capsule by mouth every morning.   COLCHICINE 0.6 MG TABLET    Take 1.2 mg by mouth See admin instructions. Take 1.2 mg for flair up and an hour later take 0.6 mg if needed   EASY COMFORT PEN NEEDLES 31G X 5 MM MISC    USE FOUR TIMES A DAY FOR INSULIN ADMINISTRATION   FLUTICASONE (FLONASE) 50 MCG/ACT NASAL SPRAY    Place 1 spray into both nostrils as needed for allergies or rhinitis.   FUROSEMIDE (FUROSCIX [place])    Inject  into the skin as directed. As needed   INSULIN LISPRO (HUMALOG) 100 UNIT/ML KWIKPEN    Inject 10 Units into the skin 3 (three) times daily with meals.   LANTUS SOLOSTAR 100 UNIT/ML SOLOSTAR PEN    Inject 37 Units into the skin daily.   LEVOTHYROXINE (SYNTHROID) 50 MCG TABLET    TAKE 1 TABLET (50 MCG TOTAL) BY MOUTH DAILY BEFORE BREAKFAST (AM)   LOSARTAN (COZAAR) 25 MG TABLET    Take 1 tablet (25 mg total) by mouth daily.   METOLAZONE (ZAROXOLYN) 2.5 MG TABLET    Take 1 tablet (2.5 mg total) by mouth once a week. Every Monday   METOPROLOL SUCCINATE (TOPROL-XL) 25 MG 24 HR TABLET    Take 1 tablet (25 mg total) by mouth daily.   OMEGA-3 FATTY ACIDS (FISH OIL PO)    Take 1 tablet by mouth daily.   PANTOPRAZOLE (PROTONIX) 40 MG TABLET    TAKE 1 TABLET (40 MG TOTAL) BY MOUTH DAILY(AM)   POLYETHYLENE GLYCOL (MIRALAX / GLYCOLAX) 17 G PACKET    Take 17 g by mouth daily as needed for moderate constipation.   POTASSIUM CHLORIDE SA (KLOR-CON M) 20 MEQ TABLET    Take 2 tablets (40 mEq total) by mouth once a week. With every dose of metolazone   ROSUVASTATIN (CRESTOR) 40 MG TABLET    TAKE 1 TABLET (40 MG TOTAL) BY MOUTH DAILY   SPIRONOLACTONE (ALDACTONE) 25 MG TABLET    Take 1  tablet (25 mg total) by mouth daily.   TORSEMIDE 40 MG TABS    Take 80 mg by mouth 2 (two) times daily.   TRIAMCINOLONE OINTMENT (KENALOG) 0.1 %    Apply topically 2 (two) times daily.   VITAMIN E PO    Take 1 tablet by mouth daily.  Modified Medications   No medications on file  Discontinued Medications   No medications on file    Allergies Allergies  Allergen Reactions   Gabapentin Nausea And Vomiting and Other (See Comments)    POSSIBLE SHAKING   Lyrica [Pregabalin] Other (See Comments)    Shaking     Past Medical History Past Medical History:  Diagnosis Date   Acute renal failure ([name])    Acute respiratory failure with hypoxia ([name]) 12/02/2019   Alcohol abuse    Alcoholic cirrhosis ([name])    Anemia    Anxiety    Arthritis    "knees" (11/26/2018)   B12 deficiency    CAD (coronary artery disease)    a. 11/10/2014 Cath: LM nl, LAD min irregs, D1 30 ost, D2 50d, LCX 91m, OM1 80 p/m (1.5 mm vessel), OM2 41m, RCA nondom 59m-->med rx.. Demand ischemia in the setting of rapid a-fib.   Cardiomyopathy ([name])    Carotid artery disease ([name])    a. 01/2015 Carotid Angio: RICA 100, LICA 95p; b. 02/2015 s/p L CEA; c. 05/2019 Carotid U/S: RICA 100. RECA >50. LICA 1-39%.   Cellulitis    lower extremities   Cellulitis in diabetic foot ([name]) 07/08/2019   CHF (congestive heart failure) ([name])    Chronic combined systolic and diastolic CHF (congestive heart failure) ([name])    a. 10/2014 Echo: EF 40-45%; b. 10/2018 Echo: EF 45-50%, gr2 DD; c. 11/2019 Echo: EF 50%, mild LVH, gr2 DD (restrictive), antlat HK, Nl RV fxn. Mild BAE. RVSP 59.64mmHg.   CKD (chronic kidney disease), stage III ([name])    Cocaine abuse ([name])    COPD (chronic obstructive pulmonary disease) ([name])  Critical ischemia of foot ([name]) 06/07/2021   Critical limb ischemia     Critical lower limb ischemia ([name])    Demand ischemia ([name]) 10/29/2014   Depression    Diabetes mellitus without complication ([name])    Diabetic peripheral  neuropathy ([name])    DVT (deep venous thrombosis) ([name])    Dyspnea    Elevated troponin    a. Chronic elevation.   Fall 05/05/2021   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[name])    Peripheral arterial disease/critical limb ischemia     GERD (gastroesophageal reflux disease)    Hyperlipemia    Hypertension    Hypokalemia    Hypomagnesemia    Hypothyroidism    Marijuana abuse    Narcotic abuse ([name])    Noncompliance    NSVT (nonsustained ventricular tachycardia) ([name])    Obesity    Osteomyelitis ([name]) 06/21/2019   PAF (paroxysmal atrial fibrillation) ([name])    Paroxysmal atrial tachycardia ([name])    Peripheral arterial disease ([name])    a. 01/2015 Angio/PTA: RSFA 99 (atherectomy/pta) - 1 vessel runoff via diff dzs peroneal; b. 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   Pneumonia    "once or twice" (11/26/2018)   Poorly controlled type 2 diabetes mellitus ([name])    Renal disorder    Renal insufficiency    a. 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   SIRS (systemic inflammatory response syndrome) ([name]) 04/06/2017   Sleep apnea    "couldn't handle wearing the mask" (11/26/2018)   Symptomatic bradycardia    a. Avoid AV blocking agent per EP. Prev req temp wire in 2017.   Tobacco abuse    Wrist fracture, left, closed, initial encounter 01/29/2015    Past Surgical History Past Surgical History:  Procedure Laterality Date   ABDOMINAL AORTOGRAM N/A 06/26/2019   Procedure: ABDOMINAL AORTOGRAM;  Surgeon: Iran Ouch, MD;  Location: [facility];  Service: Cardiovascular;  Laterality: N/A;   ABDOMINAL AORTOGRAM W/LOWER EXTREMITY N/A 06/07/2021   Procedure: ABDOMINAL AORTOGRAM W/LOWER EXTREMITY;  Surgeon: Runell Gess, MD;  Location: [facility];  Service: Cardiovascular;  Laterality: N/A;   ABDOMINAL AORTOGRAM W/LOWER EXTREMITY N/A 08/11/2023   Procedure: ABDOMINAL AORTOGRAM W/LOWER EXTREMITY;  Surgeon: Leonie Douglas, MD;  Location: [facility];  Service:  Cardiovascular;  Laterality: N/A;   AMPUTATION Right 06/14/2017   Procedure: Right foot transmetatarsal amputation;  Surgeon: Nadara Mustard, MD;  Location: [facility] OR;  Service: Orthopedics;  Laterality: Right;   AMPUTATION Left 06/28/2019   Procedure: AMPUTATION LEFT FIFTH TOE;  Surgeon: Larina Earthly, MD;  Location: [facility] OR;  Service: Vascular;  Laterality: Left;   AMPUTATION TOE Right 04/28/2017   Procedure: AMPUTATION OF RIGHT SECOND RAY;  Surgeon: Nadara Mustard, MD;  Location[facility] OR;  Service: Orthopedics;  Laterality: Right;   CARDIAC CATHETERIZATION     CARDIAC CATHETERIZATION N/A 01/12/2016   Procedure: Temporary Wire;  Surgeon: Rollene Rotunda, MD;  Location: [facility];  Service: Cardiovascular;  Laterality: N/A;   CARDIOVERSION  ~ 02/2013   "twice"    CARDIOVERSION N/A 09/26/2023   Procedure: CARDIOVERSION (CATH LAB);  Surgeon: Maisie Fus, MD;  Location: [facility] INVASIVE CV LAB;  Service: Cardiovascular;  Laterality: N/A;   CAROTID ANGIOGRAM N/A 01/15/2015   Procedure: CAROTID ANGIOGRAM;  Surgeon: Runell Gess, MD;  Location: [facility] CATH LAB;  Service: Cardiovascular;  Laterality: N/A;   DILATION AND CURETTAGE OF UTERUS  1988  ENDARTERECTOMY Left 02/19/2015   Procedure: LEFT CAROTID ENDARTERECTOMY ;  Surgeon: Nada Libman, MD;  Location: [facility] OR;  Service: Vascular;  Laterality: Left;   FEMORAL-TIBIAL BYPASS GRAFT Left 06/28/2019   Procedure: BYPASS GRAFT LEFT LEG FEMORAL TO ANTERIOR TIBIAL ARTERY using LEFT GREATER SAPHENOUS VEIN;  Surgeon: Larina Earthly, MD;  Location: [facility] OR;  Service: Vascular;  Laterality: Left;   LEFT HEART CATHETERIZATION WITH CORONARY ANGIOGRAM N/A 10/31/2014   Procedure: LEFT HEART CATHETERIZATION WITH CORONARY ANGIOGRAM;  Surgeon: Kathleene Hazel, MD;  Location: [facility] CATH LAB;  Service: Cardiovascular;  Laterality: N/A;   LOWER EXTREMITY ANGIOGRAM N/A 09/10/2013   Procedure: LOWER EXTREMITY ANGIOGRAM;  Surgeon: Runell Gess, MD;  Location: [facility] CATH LAB;   Service: Cardiovascular;  Laterality: N/A;   LOWER EXTREMITY ANGIOGRAM N/A 01/15/2015   Procedure: LOWER EXTREMITY ANGIOGRAM;  Surgeon: Runell Gess, MD;  Location: [facility] CATH LAB;  Service: Cardiovascular;  Laterality: N/A;   LOWER EXTREMITY ANGIOGRAPHY N/A 04/13/2017   Procedure: Lower Extremity Angiography;  Surgeon: Runell Gess, MD;  Location: [facility] INVASIVE CV LAB;  Service: Cardiovascular;  Laterality: N/A;   LOWER EXTREMITY ANGIOGRAPHY Left 06/26/2019   Procedure: LOWER EXTREMITY ANGIOGRAPHY;  Surgeon: Iran Ouch, MD;  Location: [facility];  Service: Cardiovascular;  Laterality: Left;   PERIPHERAL VASCULAR ATHERECTOMY Right 06/07/2021   Procedure: PERIPHERAL VASCULAR ATHERECTOMY;  Surgeon: Runell Gess, MD;  Location: [facility] INVASIVE CV LAB;  Service: Cardiovascular;  Laterality: Right;   PERIPHERAL VASCULAR BALLOON ANGIOPLASTY Left 06/26/2019   Procedure: PERIPHERAL VASCULAR BALLOON ANGIOPLASTY;  Surgeon: Iran Ouch, MD;  Location: [facility];  Service: Cardiovascular;  Laterality: Left;  unable to cross lt sfa occlusion   PERIPHERAL VASCULAR INTERVENTION  04/13/2017   Procedure: Peripheral Vascular Intervention;  Surgeon: Runell Gess, MD;  Location: [facility] INVASIVE CV LAB;  Service: Cardiovascular;;   PERIPHERAL VASCULAR INTERVENTION  08/11/2023   Procedure: PERIPHERAL VASCULAR INTERVENTION;  Surgeon: Leonie Douglas, MD;  Location: [facility];  Service: Cardiovascular;;   PRESSURE SENSOR/CARDIOMEMS N/A 02/05/2020   Procedure: PRESSURE SENSOR/CARDIOMEMS;  Surgeon: Laurey Morale, MD;  Location: [facility] INVASIVE CV LAB;  Service: Cardiovascular;  Laterality: N/A;   VEIN HARVEST Left 06/28/2019   Procedure: LEFT LEG GREATER SAPHENOUS VEIN HARVEST;  Surgeon: Larina Earthly, MD;  Location: [facility] OR;  Service: Vascular;  Laterality: Left;    Family History family history includes Breast cancer in her mother; Cancer in her mother; Clotting disorder in her mother;  Diabetes in her mother; Emphysema in her sister; Heart attack in her mother; Heart disease in her father and mother; Hypertension in her father and mother.  Social History Social History   Socioeconomic History   Marital status: Single    Spouse name: Not on file   Number of children: 1   Years of education: 12   Highest education level: 12th grade  Occupational History   Occupation: disabled  Tobacco Use   Smoking status: Every Day    Current packs/day: 1.00    Average packs/day: 1 pack/day for 44.0 years (44.0 ttl pk-yrs)    Types: E-cigarettes, Cigarettes   Smokeless tobacco: Former    Types: Snuff  Vaping Use   Vaping status: Former   Devices: 11/26/2018 "stopped months ago"  Substance and Sexual Activity   Alcohol use: Not Currently    Comment: occ   Drug use: Not Currently    Types: "Crack" cocaine, Marijuana, Oxycodone  Sexual activity: Not Currently  Other Topics Concern   Not on file  Social History Narrative   ** Merged History Encounter **       Lives in [place], in motel with sister.  They are looking to move but don't have a place to go yet.     Social Drivers of Corporate investment banker Strain: Low Risk  (02/09/2023)   Overall Financial Resource Strain (CARDIA)    Difficulty of Paying Living Expenses: Not hard at all  Food Insecurity: No Food Insecurity (10/10/2023)   Hunger Vital Sign    Worried About Running Out of Food in the Last Year: Never true    Ran Out of Food in the Last Year: Never true  Transportation Needs: No Transportation Needs (10/10/2023)   PRAPARE - Administrator, Civil Service (Medical): No    Lack of Transportation (Non-Medical): No  Physical Activity: Inactive (02/09/2023)   Exercise Vital Sign    Days of Exercise per Week: 0 days    Minutes of Exercise per Session: 0 min  Stress: No Stress Concern Present (02/09/2023)   [employer] of Occupational Health - Occupational Stress Questionnaire    Feeling of Stress :  Not at all  Social Connections: Unknown (08/21/2023)   Received from [facility]   Social Network    Social Network: Not on file  Intimate Partner Violence: Not At Risk (10/10/2023)   Humiliation, Afraid, Rape, and Kick questionnaire    Fear of Current or Ex-Partner: No    Emotionally Abused: No    Physically Abused: No    Sexually Abused: No    Lab Results  Component Value Date   HGBA1C 12.6 (H) 01/23/2024   HGBA1C 12.0 (H) 09/25/2023   HGBA1C 11.1 (H) 07/27/2023   Lab Results  Component Value Date   CHOL 189 01/23/2024   Lab Results  Component Value Date   HDL 47 (L) 01/23/2024   Lab Results  Component Value Date   LDLCALC 91 01/23/2024   Lab Results  Component Value Date   TRIG 384 (H) 01/23/2024   Lab Results  Component Value Date   CHOLHDL 4.0 01/23/2024   Lab Results  Component Value Date   CREATININE 2.01 (H) 01/23/2024   No results found for: "GFR" Lab Results  Component Value Date   MICROALBUR 9.0 01/23/2024      Component Value Date/Time   NA 138 01/23/2024 1444   NA 140 09/07/2022 1144   K 3.9 01/23/2024 1444   CL 96 (L) 01/23/2024 1444   CO2 27 01/23/2024 1444   GLUCOSE 262 (H) 01/23/2024 1444   BUN 79 (H) 01/23/2024 1444   BUN 26 (H) 09/07/2022 1144   CREATININE 2.01 (H) 01/23/2024 1444   CALCIUM 9.9 01/23/2024 1444   PROT 6.8 01/23/2024 1444   PROT 6.7 07/07/2022 1155   ALBUMIN 3.0 (L) 01/03/2024 1645   ALBUMIN 4.0 07/07/2022 1155   AST 19 01/23/2024 1444   ALT 23 01/23/2024 1444   ALKPHOS 112 01/03/2024 1645   BILITOT 0.3 01/23/2024 1444   BILITOT <0.2 07/07/2022 1155   GFRNONAA 25 (L) 01/03/2024 1645   GFRAA 39 (L) 06/08/2020 1130      Latest Ref Rng & Units 01/23/2024    2:44 PM 01/03/2024    5:00 PM 01/03/2024    4:45 PM  BMP  Glucose 65 - 99 mg/dL 295  284  132   BUN 7 - 25 mg/dL 79  440  95   Creatinine 0.50 - 1.05 mg/dL 5.78  4.69  6.29   BUN/Creat Ratio 6 - 22 (calc) 39     Sodium 135 - 146 mmol/L 138  131    131   129   Potassium 3.5 - 5.3 mmol/L 3.9  3.8    3.8  4.0   Chloride 98 - 110 mmol/L 96  89  87   CO2 20 - 32 mmol/L 27   27   Calcium 8.6 - 10.4 mg/dL 9.9   9.3        Component Value Date/Time   WBC 12.0 (H) 01/03/2024 1645   RBC 3.81 (L) 01/03/2024 1645   HGB 11.6 (L) 01/03/2024 1700   HGB 11.2 (L) 01/03/2024 1700   HGB 10.5 (L) 05/25/2021 1444   HCT 34.0 (L) 01/03/2024 1700   HCT 33.0 (L) 01/03/2024 1700   HCT 35.5 05/25/2021 1444   PLT 236 01/03/2024 1645   PLT 368 05/25/2021 1444   MCV 87.9 01/03/2024 1645   MCV 88 05/25/2021 1444   MCH 28.6 01/03/2024 1645   MCHC 32.5 01/03/2024 1645   RDW 15.6 (H) 01/03/2024 1645   RDW 14.1 05/25/2021 1444   LYMPHSABS 2.4 01/03/2024 1645   LYMPHSABS 1.6 06/20/2019 1125   MONOABS 0.7 01/03/2024 1645   EOSABS 0.2 01/03/2024 1645   EOSABS 0.3 06/20/2019 1125   BASOSABS 0.0 01/03/2024 1645   BASOSABS 0.0 06/20/2019 1125     Parts of this note may have been dictated using voice recognition software. There may be variances in spelling and vocabulary which are unintentional. Not all errors are proofread. Please notify the Thereasa Parkin if any discrepancies are noted or if the meaning of any statement is not clear.

## 2024-01-25 NOTE — Telephone Encounter (Signed)
 Was able to reach pt this morning, after several attempts.  Informed pt that Dr. Elberta Fortis spoke with the heart failure MD.  Made aware we would not move forward with an ablation procedure, she is best served with long-term medication therapy for her atrial fibrillation.  Advised to keep her follow up with the heart failure clinic on 3/27 and they would refer her back to EP if needed at a later date, she will be PRN with [place] for now. Patient verbalized understanding and agreeable to plan.

## 2024-01-29 ENCOUNTER — Other Ambulatory Visit (HOSPITAL_COMMUNITY): Payer: Self-pay

## 2024-01-29 NOTE — Progress Notes (Signed)
 Paramedicine Encounter    Patient ID: [name], female    DOB: October 15, 1961, 63 y.o.   MRN: [id number]   Complaints-chronic foot pain   Edema-slight-chronic   Compliance with meds-yes  Pill box filled-n/a If so, by whom-n/a   Refills needed-n/a   Pt reports she is feeling good these past few days. Denies increased sob, no dizziness, no c/p. mainly her chronic foot pain, gout pain to her wrist.  Taking all meds. I went by pharm to get her bubble packs to take her. I confirmed meds were accurate and placed correctly. He gave her 2 wks  She was seen at endo and her lantus was increased to 38U at bedtime.  She has been doing that. Pharm also refilled her freestyle with another meter as well. She had sensor placed last week but no way to check it. Started meter and selected to start new sensor-it says it takes to get started but told her if that sensor that's on her arm not working then to just replace it with another one.  As long as she stays on path and continues to be stable then she will be good for d/c soon. Will f/u in a few wks.  Also advised her to get PCP to refill her wellbutrin moving forward. She will let them know next time she is seen there.  Also she may need more colchicine and will call for that as well.      BP 124/76   Pulse 70   Resp 17   Wt 210 lb (95.3 kg)   LMP  (LMP Unknown)   SpO2 97%   BMI 36.05 kg/m  Weight yesterday--209  Last visit weight-209   Patient Care Team: Hillery Aldo, NP as PCP - General Allyson Sabal Delton See, MD as PCP - Cardiology (Cardiology) Laurey Morale, MD as PCP - Advanced Heart Failure (Cardiology) Regan Lemming, MD as PCP - Electrophysiology (Cardiology) Runell Gess, MD as Consulting Physician (Cardiology) Nyoka Cowden, MD as Consulting Physician (Pulmonary Disease) System, Provider Not In  Patient Active Problem List   Diagnosis Date Noted   Heart failure ([name]) 10/11/2023   Acute on chronic combined  systolic (congestive) and diastolic (congestive) heart failure ([name]) 10/10/2023   Pre-ulcerative calluses 04/25/2022   COPD (chronic obstructive pulmonary disease) ([name]) 05/05/2021   OSA (obstructive sleep apnea) 05/05/2021   Stage 3b chronic kidney disease ([name]) 05/05/2021   Pressure injury of skin 05/04/2021   Diabetic nephropathy ([name]) 01/02/2021   Low back pain 06/01/2020   Hyperlipidemia 04/03/2020   PAD (peripheral artery disease) ([name]) 12/26/2019   NICM (nonischemic cardiomyopathy) ([name]) 06/20/2019   Non-healing ulcer ([name]) 06/20/2019   Coagulation disorder ([name]) 08/09/2017   Depression 07/21/2017   At risk for adverse drug reaction 06/20/2017   Peripheral neuropathy 06/20/2017   S/P transmetatarsal amputation of foot, right ([name]) 06/05/2017   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) 05/19/2017   Obesity, class 2 02/24/2016   Encounter for therapeutic drug monitoring 02/10/2016   Symptomatic bradycardia 01/12/2016   Essential hypertension 12/22/2015   Elevated troponin    Acute on chronic diastolic CHF (congestive heart failure) ([name])    Anemia of chronic disease 11/08/2015   Hypokalemia 11/08/2015   Tobacco abuse 10/23/2015   Coronary artery disease    DOE (dyspnea on exertion) 04/29/2015   PAF (paroxysmal atrial fibrillation) ([name]) 01/16/2015   Carotid arterial disease ([name]) 01/16/2015   Insomnia 02/03/2014  S/P peripheral artery angioplasty - TurboHawk atherectomy; R SFA 09/11/2013    Class: Acute   Leg pain, bilateral 08/19/2013   Hypothyroidism 07/31/2013   History of cocaine abuse ([name]) 06/13/2013   Long term current use of anticoagulant therapy 05/20/2013   Alcohol abuse    Narcotic abuse ([name])    Marijuana abuse    Alcoholic cirrhosis ([name])    Type 2 diabetes mellitus with hyperlipidemia ([name])     Current Outpatient Medications:    ACCU-CHEK GUIDE test strip, USE TO CHECK BLOOD SUGAR FOUR TIMES  DAILY, Disp: , Rfl:    acetaminophen (TYLENOL) 500 MG tablet, Take 1,000 mg by mouth every 6 (six) hours as needed for headache (pain)., Disp: , Rfl:    albuterol (VENTOLIN HFA) 108 (90 Base) MCG/ACT inhaler, USE 2 PUFFS BY MOUTH EVERY FOUR HOURS, AS NEEDED, FOR COUGHING/WHEEZING, Disp: 8.5 g, Rfl: 0   allopurinol (ZYLOPRIM) 100 MG tablet, Take 150 mg by mouth daily., Disp: , Rfl:    apixaban (ELIQUIS) 5 MG TABS tablet, Take 1 tablet (5 mg total) by mouth 2 (two) times daily., Disp: 60 tablet, Rfl: 6   budesonide-formoterol (SYMBICORT) 160-4.5 MCG/ACT inhaler, Inhale 2 puffs into the lungs 2 (two) times daily., Disp: 1 each, Rfl: 2   buPROPion ER (WELLBUTRIN SR) 100 MG 12 hr tablet, Take 1 tablet (100 mg total) by mouth daily., Disp: 30 tablet, Rfl: 6   Cholecalciferol (VITAMIN D3 PO), Take 1 capsule by mouth every morning., Disp: , Rfl:    colchicine 0.6 MG tablet, Take 1.2 mg by mouth See admin instructions. Take 1.2 mg for flair up and an hour later take 0.6 mg if needed, Disp: , Rfl:    Continuous Glucose Receiver (FREESTYLE LIBRE 3 READER) DEVI, 1 Device by Does not apply route continuous., Disp: 1 each, Rfl: 0   Continuous Glucose Sensor (FREESTYLE LIBRE 3 PLUS SENSOR) MISC, Change sensor every 15 days., Disp: 6 each, Rfl: 3   EASY COMFORT PEN NEEDLES 31G X 5 MM MISC, USE FOUR TIMES A DAY FOR INSULIN ADMINISTRATION, Disp: 100 each, Rfl: 1   fluticasone (FLONASE) 50 MCG/ACT nasal spray, Place 1 spray into both nostrils as needed for allergies or rhinitis., Disp: , Rfl:    insulin lispro (HUMALOG) 100 UNIT/ML KwikPen, Inject 10 Units into the skin 3 (three) times daily with meals., Disp: 15 mL, Rfl: 0   LANTUS SOLOSTAR 100 UNIT/ML Solostar Pen, Inject 37 Units into the skin daily. (Patient taking differently: Inject 38 Units into the skin daily.), Disp: 15 mL, Rfl: 0   levothyroxine (SYNTHROID) 50 MCG tablet, TAKE 1 TABLET (50 MCG TOTAL) BY MOUTH DAILY BEFORE BREAKFAST (AM), Disp: 30 tablet, Rfl:  0   losartan (COZAAR) 25 MG tablet, Take 1 tablet (25 mg total) by mouth daily., Disp: 30 tablet, Rfl: 11   metolazone (ZAROXOLYN) 2.5 MG tablet, Take 1 tablet (2.5 mg total) by mouth once a week. Every Monday, Disp: 15 tablet, Rfl: 3   metoprolol succinate (TOPROL-XL) 25 MG 24 hr tablet, Take 1 tablet (25 mg total) by mouth daily., Disp: 90 tablet, Rfl: 3   Omega-3 Fatty Acids (FISH OIL PO), Take 1 tablet by mouth daily., Disp: , Rfl:    pantoprazole (PROTONIX) 40 MG tablet, TAKE 1 TABLET (40 MG TOTAL) BY MOUTH DAILY(AM), Disp: 30 tablet, Rfl: 0   polyethylene glycol (MIRALAX / GLYCOLAX) 17 g packet, Take 17 g by mouth daily as needed for moderate constipation., Disp: 14 each, Rfl: 0  potassium chloride SA (KLOR-CON M) 20 MEQ tablet, Take 2 tablets (40 mEq total) by mouth once a week. With every dose of metolazone, Disp: 8 tablet, Rfl: 11   rosuvastatin (CRESTOR) 40 MG tablet, TAKE 1 TABLET (40 MG TOTAL) BY MOUTH DAILY (Patient taking differently: Take 40 mg by mouth at bedtime.), Disp: 15 tablet, Rfl: 0   spironolactone (ALDACTONE) 25 MG tablet, Take 1 tablet (25 mg total) by mouth daily., Disp: 90 tablet, Rfl: 3   torsemide 40 MG TABS, Take 80 mg by mouth 2 (two) times daily., Disp: 360 tablet, Rfl: 3   triamcinolone ointment (KENALOG) 0.1 %, Apply topically 2 (two) times daily., Disp: 454 g, Rfl: 0   VITAMIN E PO, Take 1 tablet by mouth daily., Disp: , Rfl:    Furosemide (FUROSCIX Person), Inject into the skin as directed. As needed (Patient not taking: Reported on 01/29/2024), Disp: , Rfl:  Allergies  Allergen Reactions   Gabapentin Nausea And Vomiting and Other (See Comments)    POSSIBLE SHAKING   Lyrica [Pregabalin] Other (See Comments)    Shaking       Social History   Socioeconomic History   Marital status: Single    Spouse name: Not on file   Number of children: 1   Years of education: 12   Highest education level: 12th grade  Occupational History   Occupation: disabled   Tobacco Use   Smoking status: Every Day    Current packs/day: 1.00    Average packs/day: 1 pack/day for 44.0 years (44.0 ttl pk-yrs)    Types: E-cigarettes, Cigarettes   Smokeless tobacco: Former    Types: Snuff  Vaping Use   Vaping status: Former   Devices: 11/26/2018 "stopped months ago"  Substance and Sexual Activity   Alcohol use: Not Currently    Comment: occ   Drug use: Not Currently    Types: "Crack" cocaine, Marijuana, Oxycodone   Sexual activity: Not Currently  Other Topics Concern   Not on file  Social History Narrative   ** Merged History Encounter **       Lives in [place], in motel with sister.  They are looking to move but don't have a place to go yet.     Social Drivers of Corporate investment banker Strain: Low Risk  (02/09/2023)   Overall Financial Resource Strain (CARDIA)    Difficulty of Paying Living Expenses: Not hard at all  Food Insecurity: No Food Insecurity (10/10/2023)   Hunger Vital Sign    Worried About Running Out of Food in the Last Year: Never true    Ran Out of Food in the Last Year: Never true  Transportation Needs: No Transportation Needs (10/10/2023)   PRAPARE - Administrator, Civil Service (Medical): No    Lack of Transportation (Non-Medical): No  Physical Activity: Inactive (02/09/2023)   Exercise Vital Sign    Days of Exercise per Week: 0 days    Minutes of Exercise per Session: 0 min  Stress: No Stress Concern Present (02/09/2023)   [employer] of Occupational Health - Occupational Stress Questionnaire    Feeling of Stress : Not at all  Social Connections: Unknown (08/21/2023)   Received from [facility]   Social Network    Social Network: Not on file  Intimate Partner Violence: Not At Risk (10/10/2023)   Humiliation, Afraid, Rape, and Kick questionnaire    Fear of Current or Ex-Partner: No    Emotionally Abused: No  Physically Abused: No    Sexually Abused: No    Physical Exam      Future Appointments   Date Time Provider Department Center  02/05/2024  2:20 PM [place], MD [facility] None  02/08/2024 11:30 AM [facility] PA/NP [facility] None  02/13/2024  2:20 PM [facility] ANNUAL WELLNESS VISIT [facility] None  04/09/2024  3:00 PM [facility] 4 [facility]  04/09/2024  4:00 PM Leonie Douglas, MD [facility]       Kerry Hough, Paramedic [phone] [facility] Paramedic  01/29/24

## 2024-02-05 ENCOUNTER — Encounter: Payer: Self-pay | Admitting: "Endocrinology

## 2024-02-05 ENCOUNTER — Ambulatory Visit (INDEPENDENT_AMBULATORY_CARE_PROVIDER_SITE_OTHER): Admitting: "Endocrinology

## 2024-02-05 VITALS — BP 116/80 | HR 88 | Ht 64.0 in | Wt 217.0 lb

## 2024-02-05 DIAGNOSIS — E1165 Type 2 diabetes mellitus with hyperglycemia: Secondary | ICD-10-CM | POA: Diagnosis not present

## 2024-02-05 DIAGNOSIS — Z794 Long term (current) use of insulin: Secondary | ICD-10-CM | POA: Diagnosis not present

## 2024-02-05 DIAGNOSIS — E782 Mixed hyperlipidemia: Secondary | ICD-10-CM | POA: Diagnosis not present

## 2024-02-05 MED ORDER — INSULIN LISPRO (1 UNIT DIAL) 100 UNIT/ML (KWIKPEN): 15.0000 [IU] | PEN_INJECTOR | Freq: Three times a day (TID) | SUBCUTANEOUS | 1 refills | Status: DC

## 2024-02-05 MED ORDER — GABAPENTIN 100 MG PO CAPS: 100.0000 mg | ORAL_CAPSULE | Freq: Three times a day (TID) | ORAL | 1 refills | Status: DC

## 2024-02-05 NOTE — Progress Notes (Signed)
 Outpatient Endocrinology Note Karla Healy, MD  02/05/24   [name] 1961/01/20 [id number]  Referring Provider: Hillery Aldo, NP Primary Care Provider: Hillery Aldo, NP Reason for consultation: Subjective   Assessment & Plan  Diagnoses and all orders for this visit:  Uncontrolled type 2 diabetes mellitus with hyperglycemia ([name]) -     gabapentin (NEURONTIN) 100 MG capsule; Take 1 capsule (100 mg total) by mouth 3 (three) times daily. -     insulin lispro (HUMALOG) 100 UNIT/ML KwikPen; Inject 15 Units into the skin 3 (three) times daily with meals. 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, , No results found for: "GFR" Hba1c goal less than 7, current Hba1c is  Lab Results  Component Value Date   HGBA1C 12.6 (H) 01/23/2024   Will recommend the following: Lantus 50 units at bedtime, increase by 1 unit unless blood sugar is in 150s Humalog 15 units tid 15 min before meals, increase by 1 unit unless blood sugar is in 150s Has libre 01/25/24 Ordered DM education   No known contraindications/side effects to any of above medications Glucagon discussed and prescribed with refills on 01/25/24    -Last LD and Tg are as follows: Lab Results  Component Value Date   LDLCALC 91 01/23/2024    Lab Results  Component Value Date   TRIG 384 (H) 01/23/2024   -On rosuvastatin 40 mg QD -Follow low fat diet and exercise   -Blood pressure goal <140/90 - Microalbumin/creatinine goal is < 30 -Last MA/Cr is as follows: Lab Results  Component Value Date   MICROALBUR 9.0 01/23/2024   -on ACE/ARB losartan 25 mg every day  -diet changes including salt restriction -limit eating outside -counseled BP targets per standards of diabetes care -uncontrolled blood pressure can lead to retinopathy, nephropathy and cardiovascular and atherosclerotic heart  disease  Reviewed and counseled on: -A1C target -Blood sugar targets -Complications of uncontrolled diabetes  -Checking blood sugar before meals and bedtime and bring log next visit -All medications with mechanism of action and side effects -Hypoglycemia management: rule of 15's, Glucagon Emergency Kit and medical alert ID -low-carb low-fat plate-method diet -At least 20 minutes of physical activity per day -Annual dilated retinal eye exam and foot exam -compliance and follow up needs -follow up as scheduled or earlier if problem gets worse  Call if blood sugar is less than 70 or consistently above 250    Take a 15 gm snack of carbohydrate at bedtime before you go to sleep if your blood sugar is less than 100.    If you are going to fast after midnight for a test or procedure, ask your physician for instructions on how to reduce/decrease your insulin dose.    Call if blood sugar is less than 70 or consistently above 250  -Treating a low sugar by rule of 15  (15 gms of sugar every 15 min until sugar is more than 70) If you feel your sugar is low, test your sugar to be sure If your sugar is low (less than 70), then take 15 grams of a fast acting Carbohydrate (3-4 glucose tablets or glucose gel or 4 ounces of juice or regular soda) Recheck your sugar 15 min after treating low to make sure it is more than 70 If sugar is still less than 70, treat again with 15 grams of carbohydrate  Don't drive the hour of hypoglycemia  If unconscious/unable to eat or drink by mouth, use glucagon injection or nasal spray baqsimi and call 911. Can repeat again in 15 min if still unconscious.  Return in about 15 days (around 02/20/2024).   I have reviewed current medications, nurse's notes, allergies, vital signs, past medical and surgical history, family medical history, and social history for this encounter. Counseled patient on symptoms, examination findings, lab findings, imaging results,  treatment decisions and monitoring and prognosis. The patient understood the recommendations and agrees with the treatment plan. All questions regarding treatment plan were fully answered.  Karla , MD  02/05/24    History of Present Illness [name] is a 63 y.o. year old female who presents for evaluation of Type II diabetes mellitus.  [name] was first diagnosed around 2014.   Diabetes education +  Home diabetes regimen: Lantus 40 units at bedtime Humalog 12 units tid  COMPLICATIONS -  MI/Stroke +  retinopathy +  neuropathy +  nephropathy  SYMPTOMS REVIEWED + Polyuria - Weight loss + Blurred vision  BLOOD SUGAR DATA  CGM interpretation: At today's visit, we reviewed her CGM downloads. The full report is scanned in the media. Reviewing the CGM trends, BG are elevated across the day.  Physical Exam  BP 116/80   Pulse 88   Ht 5\' 4"  (1.626 m)   Wt 217 lb (98.4 kg)   LMP  (LMP Unknown)   SpO2 96%   BMI 37.25 kg/m    Constitutional: well developed, well nourished Head: normocephalic, atraumatic Eyes: sclera anicteric, no redness Neck: supple Lungs: normal respiratory effort Neurology: alert and oriented Skin: dry, no appreciable rashes Musculoskeletal: no appreciable defects Psychiatric: normal mood and affect Diabetic Foot Exam - Simple   No data filed      Current Medications Patient's Medications  New Prescriptions   GABAPENTIN (NEURONTIN) 100 MG CAPSULE    Take 1 capsule (100 mg total) by mouth 3 (three) times daily.  Previous Medications   ACCU-CHEK GUIDE TEST STRIP    USE TO CHECK BLOOD SUGAR FOUR TIMES DAILY   ACETAMINOPHEN (TYLENOL) 500 MG TABLET    Take 1,000 mg by mouth every 6 (six) hours as needed for headache (pain).   ALBUTEROL (VENTOLIN HFA) 108 (90 BASE) MCG/ACT INHALER    USE 2 PUFFS BY MOUTH EVERY FOUR HOURS, AS NEEDED, FOR COUGHING/WHEEZING   ALLOPURINOL (ZYLOPRIM) 100 MG TABLET    Take 150 mg by mouth daily.   APIXABAN  (ELIQUIS) 5 MG TABS TABLET    Take 1 tablet (5 mg total) by mouth 2 (two) times daily.   BUDESONIDE-FORMOTEROL (SYMBICORT) 160-4.5 MCG/ACT INHALER    Inhale 2 puffs into the lungs 2 (two) times daily.   BUPROPION ER (WELLBUTRIN SR) 100 MG 12 HR TABLET    Take 1 tablet (100 mg total) by mouth daily.   CHOLECALCIFEROL (VITAMIN D3 PO)    Take 1 capsule by mouth every morning.   COLCHICINE 0.6 MG TABLET    Take 1.2 mg by mouth See admin instructions. Take 1.2 mg for flair up and an hour later take 0.6 mg if needed   CONTINUOUS GLUCOSE RECEIVER (FREESTYLE LIBRE 3 READER) DEVI    1 Device by Does not apply route continuous.   CONTINUOUS GLUCOSE SENSOR (FREESTYLE LIBRE 3 PLUS SENSOR) MISC    Change sensor every 15 days.   EASY COMFORT PEN NEEDLES 31G X 5 MM MISC    USE FOUR  TIMES A DAY FOR INSULIN ADMINISTRATION   FLUTICASONE (FLONASE) 50 MCG/ACT NASAL SPRAY    Place 1 spray into both nostrils as needed for allergies or rhinitis.   FUROSEMIDE (FUROSCIX [place])    Inject into the skin as directed. As needed   LANTUS SOLOSTAR 100 UNIT/ML SOLOSTAR PEN    Inject 37 Units into the skin daily.   LEVOTHYROXINE (SYNTHROID) 50 MCG TABLET    TAKE 1 TABLET (50 MCG TOTAL) BY MOUTH DAILY BEFORE BREAKFAST (AM)   LOSARTAN (COZAAR) 25 MG TABLET    Take 1 tablet (25 mg total) by mouth daily.   METOLAZONE (ZAROXOLYN) 2.5 MG TABLET    Take 1 tablet (2.5 mg total) by mouth once a week. Every Monday   METOPROLOL SUCCINATE (TOPROL-XL) 25 MG 24 HR TABLET    Take 1 tablet (25 mg total) by mouth daily.   OMEGA-3 FATTY ACIDS (FISH OIL PO)    Take 1 tablet by mouth daily.   PANTOPRAZOLE (PROTONIX) 40 MG TABLET    TAKE 1 TABLET (40 MG TOTAL) BY MOUTH DAILY(AM)   POLYETHYLENE GLYCOL (MIRALAX / GLYCOLAX) 17 G PACKET    Take 17 g by mouth daily as needed for moderate constipation.   POTASSIUM CHLORIDE SA (KLOR-CON M) 20 MEQ TABLET    Take 2 tablets (40 mEq total) by mouth once a week. With every dose of metolazone   ROSUVASTATIN (CRESTOR)  40 MG TABLET    TAKE 1 TABLET (40 MG TOTAL) BY MOUTH DAILY   SPIRONOLACTONE (ALDACTONE) 25 MG TABLET    Take 1 tablet (25 mg total) by mouth daily.   TORSEMIDE 40 MG TABS    Take 80 mg by mouth 2 (two) times daily.   TRIAMCINOLONE OINTMENT (KENALOG) 0.1 %    Apply topically 2 (two) times daily.   VITAMIN E PO    Take 1 tablet by mouth daily.  Modified Medications   Modified Medication Previous Medication   INSULIN LISPRO (HUMALOG) 100 UNIT/ML KWIKPEN insulin lispro (HUMALOG) 100 UNIT/ML KwikPen      Inject 15 Units into the skin 3 (three) times daily with meals. Plus sliding scale, max dose 70 units /day    Inject 10 Units into the skin 3 (three) times daily with meals.  Discontinued Medications   No medications on file    Allergies Allergies  Allergen Reactions   Gabapentin Nausea And Vomiting and Other (See Comments)    POSSIBLE SHAKING   Lyrica [Pregabalin] Other (See Comments)    Shaking     Past Medical History Past Medical History:  Diagnosis Date   Acute renal failure ([name])    Acute respiratory failure with hypoxia ([name]) 12/02/2019   Alcohol abuse    Alcoholic cirrhosis ([name])    Anemia    Anxiety    Arthritis    "knees" (11/26/2018)   B12 deficiency    CAD (coronary artery disease)    a. 11/10/2014 Cath: LM nl, LAD min irregs, D1 30 ost, D2 50d, LCX 58m, OM1 80 p/m (1.5 mm vessel), OM2 30m, RCA nondom 85m-->med rx.. Demand ischemia in the setting of rapid a-fib.   Cardiomyopathy ([name])    Carotid artery disease ([name])    a. 01/2015 Carotid Angio: RICA 100, LICA 95p; b. 02/2015 s/p L CEA; c. 05/2019 Carotid U/S: RICA 100. RECA >50. LICA 1-39%.   Cellulitis    lower extremities   Cellulitis in diabetic foot ([name]) 07/08/2019   CHF (congestive heart failure) ([name])    Chronic  combined systolic and diastolic CHF (congestive heart failure) ([name])    a. 10/2014 Echo: EF 40-45%; b. 10/2018 Echo: EF 45-50%, gr2 DD; c. 11/2019 Echo: EF 50%, mild LVH, gr2 DD (restrictive), antlat HK,  Nl RV fxn. Mild BAE. RVSP 59.42mmHg.   CKD (chronic kidney disease), stage III ([name])    Cocaine abuse ([name])    COPD (chronic obstructive pulmonary disease) ([name])    Critical ischemia of foot ([name]) 06/07/2021   Critical limb ischemia     Critical lower limb ischemia ([name])    Demand ischemia ([name]) 10/29/2014   Depression    Diabetes mellitus without complication ([name])    Diabetic peripheral neuropathy ([name])    DVT (deep venous thrombosis) ([name])    Dyspnea    Elevated troponin    a. Chronic elevation.   Fall 05/05/2021   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[name])    Peripheral arterial disease/critical limb ischemia     GERD (gastroesophageal reflux disease)    Hyperlipemia    Hypertension    Hypokalemia    Hypomagnesemia    Hypothyroidism    Marijuana abuse    Narcotic abuse ([name])    Noncompliance    NSVT (nonsustained ventricular tachycardia) ([name])    Obesity    Osteomyelitis ([name]) 06/21/2019   PAF (paroxysmal atrial fibrillation) ([name])    Paroxysmal atrial tachycardia ([name])    Peripheral arterial disease ([name])    a. 01/2015 Angio/PTA: RSFA 99 (atherectomy/pta) - 1 vessel runoff via diff dzs peroneal; b. 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   Pneumonia    "once or twice" (11/26/2018)   Poorly controlled type 2 diabetes mellitus ([name])    Renal disorder    Renal insufficiency    a. 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   SIRS (systemic inflammatory response syndrome) ([name]) 04/06/2017   Sleep apnea    "couldn't handle wearing the mask" (11/26/2018)   Symptomatic bradycardia    a. Avoid AV blocking agent per EP. Prev req temp wire in 2017.   Tobacco abuse    Wrist fracture, left, closed, initial encounter 01/29/2015    Past Surgical History Past Surgical History:  Procedure Laterality Date   ABDOMINAL AORTOGRAM N/A 06/26/2019   Procedure: ABDOMINAL AORTOGRAM;  Surgeon: Iran Ouch, MD;  Location: [facility];  Service: Cardiovascular;  Laterality: N/A;    ABDOMINAL AORTOGRAM W/LOWER EXTREMITY N/A 06/07/2021   Procedure: ABDOMINAL AORTOGRAM W/LOWER EXTREMITY;  Surgeon: Runell Gess, MD;  Location: [facility];  Service: Cardiovascular;  Laterality: N/A;   ABDOMINAL AORTOGRAM W/LOWER EXTREMITY N/A 08/11/2023   Procedure: ABDOMINAL AORTOGRAM W/LOWER EXTREMITY;  Surgeon: Leonie Douglas, MD;  Location: [facility];  Service: Cardiovascular;  Laterality: N/A;   AMPUTATION Right 06/14/2017   Procedure: Right foot transmetatarsal amputation;  Surgeon: Nadara Mustard, MD;  Location: [facility] OR;  Service: Orthopedics;  Laterality: Right;   AMPUTATION Left 06/28/2019   Procedure: AMPUTATION LEFT FIFTH TOE;  Surgeon: Larina Earthly, MD;  Location: [facility] OR;  Service: Vascular;  Laterality: Left;   AMPUTATION TOE Right 04/28/2017   Procedure: AMPUTATION OF RIGHT SECOND RAY;  Surgeon: Nadara Mustard, MD;  Location: [facility] OR;  Service: Orthopedics;  Laterality: Right;   CARDIAC CATHETERIZATION     CARDIAC CATHETERIZATION N/A 01/12/2016   Procedure: Temporary Wire;  Surgeon: Rollene Rotunda, MD;  [facility]: [facility];  Service: Cardiovascular;  Laterality: N/A;   CARDIOVERSION  ~ 02/2013   "  twice"    CARDIOVERSION N/A 09/26/2023   Procedure: CARDIOVERSION (CATH LAB);  Surgeon: Maisie Fus, MD;  Location: [facility] INVASIVE CV LAB;  Service: Cardiovascular;  Laterality: N/A;   CAROTID ANGIOGRAM N/A 01/15/2015   Procedure: CAROTID ANGIOGRAM;  Surgeon: Runell Gess, MD;  Location: [facility] CATH LAB;  Service: Cardiovascular;  Laterality: N/A;   DILATION AND CURETTAGE OF UTERUS  1988   ENDARTERECTOMY Left 02/19/2015   Procedure: LEFT CAROTID ENDARTERECTOMY ;  Surgeon: Nada Libman, MD;  Location: [facility] OR;  Service: Vascular;  Laterality: Left;   FEMORAL-TIBIAL BYPASS GRAFT Left 06/28/2019   Procedure: BYPASS GRAFT LEFT LEG FEMORAL TO ANTERIOR TIBIAL ARTERY using LEFT GREATER SAPHENOUS VEIN;  Surgeon: Larina Earthly, MD;  Location: [facility] OR;  Service: Vascular;   Laterality: Left;   LEFT HEART CATHETERIZATION WITH CORONARY ANGIOGRAM N/A 10/31/2014   Procedure: LEFT HEART CATHETERIZATION WITH CORONARY ANGIOGRAM;  Surgeon: Kathleene Hazel, MD;  Location: [facility] CATH LAB;  Service: Cardiovascular;  Laterality: N/A;   LOWER EXTREMITY ANGIOGRAM N/A 09/10/2013   Procedure: LOWER EXTREMITY ANGIOGRAM;  Surgeon: Runell Gess, MD;  Location[facility] CATH LAB;  Service: Cardiovascular;  Laterality: N/A;   LOWER EXTREMITY ANGIOGRAM N/A 01/15/2015   Procedure: LOWER EXTREMITY ANGIOGRAM;  Surgeon: Runell Gess, MD;  Location: [facility] CATH LAB;  Service: Cardiovascular;  Laterality: N/A;   LOWER EXTREMITY ANGIOGRAPHY N/A 04/13/2017   Procedure: Lower Extremity Angiography;  Surgeon: Runell Gess, MD;  Location: [facility] INVASIVE CV LAB;  Service: Cardiovascular;  Laterality: N/A;   LOWER EXTREMITY ANGIOGRAPHY Left 06/26/2019   Procedure: LOWER EXTREMITY ANGIOGRAPHY;  Surgeon: Iran Ouch, MD;  [facility];  Service: Cardiovascular;  Laterality: Left;   PERIPHERAL VASCULAR ATHERECTOMY Right 06/07/2021   Procedure: PERIPHERAL VASCULAR ATHERECTOMY;  Surgeon: Runell Gess, MD;  Location: [facility] INVASIVE CV LAB;  Service: Cardiovascular;  Laterality: Right;   PERIPHERAL VASCULAR BALLOON ANGIOPLASTY Left 06/26/2019   Procedure: PERIPHERAL VASCULAR BALLOON ANGIOPLASTY;  Surgeon: Iran Ouch, MD;  [facility];  Service: Cardiovascular;  Laterality: Left;  unable to cross lt sfa occlusion   PERIPHERAL VASCULAR INTERVENTION  04/13/2017   Procedure: Peripheral Vascular Intervention;  Surgeon: Runell Gess, MD;  Location: [facility] INVASIVE CV LAB;  Service: Cardiovascular;;   PERIPHERAL VASCULAR INTERVENTION  08/11/2023   Procedure: PERIPHERAL VASCULAR INTERVENTION;  Surgeon: Leonie Douglas, MD;  [facility];  Service: Cardiovascular;;   PRESSURE SENSOR/CARDIOMEMS N/A 02/05/2020   Procedure: PRESSURE SENSOR/CARDIOMEMS;   Surgeon: Laurey Morale, MD;  Location: [facility] INVASIVE CV LAB;  Service: Cardiovascular;  Laterality: N/A;   VEIN HARVEST Left 06/28/2019   Procedure: LEFT LEG GREATER SAPHENOUS VEIN HARVEST;  Surgeon: Larina Earthly, MD;  Location: [facility] OR;  Service: Vascular;  Laterality: Left;    Family History family history includes Breast cancer in her mother; Cancer in her mother; Clotting disorder in her mother; Diabetes in her mother; Emphysema in her sister; Heart attack in her mother; Heart disease in her father and mother; Hypertension in her father and mother.  Social History Social History   Socioeconomic History   Marital status: Single    Spouse name: Not on file   Number of children: 1   Years of education: 12   Highest education level: 12th grade  Occupational History   Occupation: disabled  Tobacco Use   Smoking status: Every Day    Current packs/day: 1.00    Average packs/day: 1  pack/day for 44.0 years (44.0 ttl pk-yrs)    Types: E-cigarettes, Cigarettes   Smokeless tobacco: Former    Types: Snuff  Vaping Use   Vaping status: Former   Devices: 11/26/2018 "stopped months ago"  Substance and Sexual Activity   Alcohol use: Not Currently    Comment: occ   Drug use: Not Currently    Types: "Crack" cocaine, Marijuana, Oxycodone   Sexual activity: Not Currently  Other Topics Concern   Not on file  Social History Narrative   ** Merged History Encounter **       Lives in [place], in motel with sister.  They are looking to move but don't have a place to go yet.     Social Drivers of Corporate investment banker Strain: Low Risk  (02/09/2023)   Overall Financial Resource Strain (CARDIA)    Difficulty of Paying Living Expenses: Not hard at all  Food Insecurity: No Food Insecurity (10/10/2023)   Hunger Vital Sign    Worried About Running Out of Food in the Last Year: Never true    Ran Out of Food in the Last Year: Never true  Transportation Needs: No Transportation Needs (10/10/2023)    PRAPARE - Administrator, Civil Service (Medical): No    Lack of Transportation (Non-Medical): No  Physical Activity: Inactive (02/09/2023)   Exercise Vital Sign    Days of Exercise per Week: 0 days    Minutes of Exercise per Session: 0 min  Stress: No Stress Concern Present (02/09/2023)   [employer] of Occupational Health - Occupational Stress Questionnaire    Feeling of Stress : Not at all  Social Connections: Unknown (08/21/2023)   Received from [facility]   Social Network    Social Network: Not on file  Intimate Partner Violence: Not At Risk (10/10/2023)   Humiliation, Afraid, Rape, and Kick questionnaire    Fear of Current or Ex-Partner: No    Emotionally Abused: No    Physically Abused: No    Sexually Abused: No    Lab Results  Component Value Date   HGBA1C 12.6 (H) 01/23/2024   HGBA1C 12.0 (H) 09/25/2023   HGBA1C 11.1 (H) 07/27/2023   Lab Results  Component Value Date   CHOL 189 01/23/2024   Lab Results  Component Value Date   HDL 47 (L) 01/23/2024   Lab Results  Component Value Date   LDLCALC 91 01/23/2024   Lab Results  Component Value Date   TRIG 384 (H) 01/23/2024   Lab Results  Component Value Date   CHOLHDL 4.0 01/23/2024   Lab Results  Component Value Date   CREATININE 2.01 (H) 01/23/2024   No results found for: "GFR" Lab Results  Component Value Date   MICROALBUR 9.0 01/23/2024      Component Value Date/Time   NA 138 01/23/2024 1444   NA 140 09/07/2022 1144   K 3.9 01/23/2024 1444   CL 96 (L) 01/23/2024 1444   CO2 27 01/23/2024 1444   GLUCOSE 262 (H) 01/23/2024 1444   BUN 79 (H) 01/23/2024 1444   BUN 26 (H) 09/07/2022 1144   CREATININE 2.01 (H) 01/23/2024 1444   CALCIUM 9.9 01/23/2024 1444   PROT 6.8 01/23/2024 1444   PROT 6.7 07/07/2022 1155   ALBUMIN 3.0 (L) 01/03/2024 1645   ALBUMIN 4.0 07/07/2022 1155   AST 19 01/23/2024 1444   ALT 23 01/23/2024 1444   ALKPHOS 112 01/03/2024 1645   BILITOT 0.3 01/23/2024  1444  BILITOT <0.2 07/07/2022 1155   GFRNONAA 25 (L) 01/03/2024 1645   GFRAA 39 (L) 06/08/2020 1130      Latest Ref Rng & Units 01/23/2024    2:44 PM 01/03/2024    5:00 PM 01/03/2024    4:45 PM  BMP  Glucose 65 - 99 mg/dL 161  096  045   BUN 7 - 25 mg/dL 79  409  95   Creatinine 0.50 - 1.05 mg/dL 8.11  9.14  7.82   BUN/Creat Ratio 6 - 22 (calc) 39     Sodium 135 - 146 mmol/L 138  131    131  129   Potassium 3.5 - 5.3 mmol/L 3.9  3.8    3.8  4.0   Chloride 98 - 110 mmol/L 96  89  87   CO2 20 - 32 mmol/L 27   27   Calcium 8.6 - 10.4 mg/dL 9.9   9.3        Component Value Date/Time   WBC 12.0 (H) 01/03/2024 1645   RBC 3.81 (L) 01/03/2024 1645   HGB 11.6 (L) 01/03/2024 1700   HGB 11.2 (L) 01/03/2024 1700   HGB 10.5 (L) 05/25/2021 1444   HCT 34.0 (L) 01/03/2024 1700   HCT 33.0 (L) 01/03/2024 1700   HCT 35.5 05/25/2021 1444   PLT 236 01/03/2024 1645   PLT 368 05/25/2021 1444   MCV 87.9 01/03/2024 1645   MCV 88 05/25/2021 1444   MCH 28.6 01/03/2024 1645   MCHC 32.5 01/03/2024 1645   RDW 15.6 (H) 01/03/2024 1645   RDW 14.1 05/25/2021 1444   LYMPHSABS 2.4 01/03/2024 1645   LYMPHSABS 1.6 06/20/2019 1125   MONOABS 0.7 01/03/2024 1645   EOSABS 0.2 01/03/2024 1645   EOSABS 0.3 06/20/2019 1125   BASOSABS 0.0 01/03/2024 1645   BASOSABS 0.0 06/20/2019 1125     Parts of this note may have been dictated using voice recognition software. There may be variances in spelling and vocabulary which are unintentional. Not all errors are proofread. Please notify the There[facility] Parkin if any discrepancies are noted or if the meaning of any statement is not clear.

## 2024-02-07 ENCOUNTER — Telehealth (HOSPITAL_COMMUNITY): Payer: Self-pay

## 2024-02-07 NOTE — Telephone Encounter (Signed)
 Called to confirm/remind patient of their appointment at [facility] on 02/08/24. However, pt answered phone disconnected.

## 2024-02-08 ENCOUNTER — Other Ambulatory Visit (HOSPITAL_COMMUNITY): Payer: Self-pay

## 2024-02-08 ENCOUNTER — Encounter (HOSPITAL_COMMUNITY): Payer: Self-pay

## 2024-02-08 ENCOUNTER — Ambulatory Visit (HOSPITAL_COMMUNITY)
Admission: RE | Admit: 2024-02-08 | Discharge: 2024-02-08 | Disposition: A | Discharge status: Home / Self Care | Payer: 59 | Source: Ambulatory Visit | Attending: Cardiology | Admitting: Cardiology

## 2024-02-08 VITALS — BP 148/64 | HR 83 | Ht 63.0 in | Wt 214.6 lb

## 2024-02-08 DIAGNOSIS — I5021 Acute systolic (congestive) heart failure: Secondary | ICD-10-CM | POA: Diagnosis present

## 2024-02-08 DIAGNOSIS — I6529 Occlusion and stenosis of unspecified carotid artery: Secondary | ICD-10-CM | POA: Insufficient documentation

## 2024-02-08 DIAGNOSIS — Z7901 Long term (current) use of anticoagulants: Secondary | ICD-10-CM | POA: Insufficient documentation

## 2024-02-08 DIAGNOSIS — Z955 Presence of coronary angioplasty implant and graft: Secondary | ICD-10-CM | POA: Insufficient documentation

## 2024-02-08 DIAGNOSIS — N179 Acute kidney failure, unspecified: Secondary | ICD-10-CM | POA: Diagnosis not present

## 2024-02-08 DIAGNOSIS — F1011 Alcohol abuse, in remission: Secondary | ICD-10-CM | POA: Insufficient documentation

## 2024-02-08 DIAGNOSIS — R001 Bradycardia, unspecified: Secondary | ICD-10-CM | POA: Insufficient documentation

## 2024-02-08 DIAGNOSIS — I5022 Chronic systolic (congestive) heart failure: Secondary | ICD-10-CM | POA: Diagnosis present

## 2024-02-08 DIAGNOSIS — I480 Paroxysmal atrial fibrillation: Secondary | ICD-10-CM | POA: Diagnosis not present

## 2024-02-08 DIAGNOSIS — I130 Hypertensive heart and chronic kidney disease with heart failure and stage 1 through stage 4 chronic kidney disease, or unspecified chronic kidney disease: Secondary | ICD-10-CM | POA: Insufficient documentation

## 2024-02-08 DIAGNOSIS — N183 Chronic kidney disease, stage 3 unspecified: Secondary | ICD-10-CM | POA: Insufficient documentation

## 2024-02-08 DIAGNOSIS — E785 Hyperlipidemia, unspecified: Secondary | ICD-10-CM | POA: Diagnosis not present

## 2024-02-08 DIAGNOSIS — N1831 Chronic kidney disease, stage 3a: Secondary | ICD-10-CM

## 2024-02-08 DIAGNOSIS — I10 Essential (primary) hypertension: Secondary | ICD-10-CM

## 2024-02-08 DIAGNOSIS — J449 Chronic obstructive pulmonary disease, unspecified: Secondary | ICD-10-CM | POA: Diagnosis not present

## 2024-02-08 DIAGNOSIS — Z9889 Other specified postprocedural states: Secondary | ICD-10-CM | POA: Diagnosis not present

## 2024-02-08 DIAGNOSIS — E1122 Type 2 diabetes mellitus with diabetic chronic kidney disease: Secondary | ICD-10-CM | POA: Diagnosis not present

## 2024-02-08 DIAGNOSIS — G4733 Obstructive sleep apnea (adult) (pediatric): Secondary | ICD-10-CM | POA: Insufficient documentation

## 2024-02-08 DIAGNOSIS — I2510 Atherosclerotic heart disease of native coronary artery without angina pectoris: Secondary | ICD-10-CM | POA: Diagnosis not present

## 2024-02-08 DIAGNOSIS — Z89422 Acquired absence of other left toe(s): Secondary | ICD-10-CM | POA: Diagnosis not present

## 2024-02-08 DIAGNOSIS — Z794 Long term (current) use of insulin: Secondary | ICD-10-CM | POA: Insufficient documentation

## 2024-02-08 DIAGNOSIS — F1411 Cocaine abuse, in remission: Secondary | ICD-10-CM | POA: Insufficient documentation

## 2024-02-08 DIAGNOSIS — I739 Peripheral vascular disease, unspecified: Secondary | ICD-10-CM | POA: Diagnosis not present

## 2024-02-08 DIAGNOSIS — Z79899 Other long term (current) drug therapy: Secondary | ICD-10-CM | POA: Insufficient documentation

## 2024-02-08 DIAGNOSIS — Z89431 Acquired absence of right foot: Secondary | ICD-10-CM | POA: Insufficient documentation

## 2024-02-08 DIAGNOSIS — F17210 Nicotine dependence, cigarettes, uncomplicated: Secondary | ICD-10-CM | POA: Diagnosis not present

## 2024-02-08 DIAGNOSIS — F17200 Nicotine dependence, unspecified, uncomplicated: Secondary | ICD-10-CM

## 2024-02-08 LAB — BASIC METABOLIC PANEL WITH GFR
Anion gap: 14 (ref 5–15)
BUN: 90 mg/dL — ABNORMAL HIGH (ref 8–23)
CO2: 26 mmol/L (ref 22–32)
Calcium: 9.6 mg/dL (ref 8.9–10.3)
Chloride: 93 mmol/L — ABNORMAL LOW (ref 98–111)
Creatinine, Ser: 2.21 mg/dL — ABNORMAL HIGH (ref 0.44–1.00)
GFR, Estimated: 25 mL/min — ABNORMAL LOW (ref >60)
Glucose, Bld: 415 mg/dL — ABNORMAL HIGH (ref 70–99)
Potassium: 4.7 mmol/L (ref 3.5–5.1)
Sodium: 133 mmol/L — ABNORMAL LOW (ref 135–145)

## 2024-02-08 LAB — BRAIN NATRIURETIC PEPTIDE: B Natriuretic Peptide: 136.6 pg/mL — ABNORMAL HIGH (ref 0.0–100.0)

## 2024-02-08 NOTE — Progress Notes (Signed)
 Paramedicine Encounter   Patient ID: [name] , female,   DOB: May 31, 1961,62 y.o.,  MRN: [id number]   Met patient in clinic today with provider.  Weight @ clinic-214  B/P-154/70>>148/64 [id number] [id number]  Gabapentin was added at her endo doc appoint last week Last month her kidney function has been elevated.  Cardiomems readings-22 today but goal is 20.  Hold metolazone for now and only take if needed and hold the potassium also. This was relayed to pharmacy as well.  I also relayed to [facility] for the gabapentin to place in pill box.  B/p elevated today but she is in a lot of pain.  Fluid looks good today.   Need to watch the kidney function.   Not sure what is going on with her libre sensor, it will not take a new reading. She has had this sensor on since last week. Her sensor shows new one is needed in 6 days. Next week when I see her I will see about getting the [place] app on her phone.   Kerry Hough, EMT-Paramedic [phone] 02/08/2024

## 2024-02-08 NOTE — Progress Notes (Signed)
 Patient ID: [name], female   DOB: Feb 04, 1961, 63 y.o.   MRN: [id number]    Advanced Heart Failure Clinic Note   PCP: [facility] PV: Dr. Allyson Sabal HF Cardiology: Dr. Shirlee Latch  Reason for Visit: Heart Failure Follow-up HPI: [name] is a 63 y.o. with history of PAD, carotid stenosis s/p left CEA, CAD, chronic systolic CHF, paroxysmal atrial fibrillation, bradycardia, and prior substance abuse.   In 5/18, she had peripheral angiography with atherectomy of right SFA.  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 In 8/20, she had peripheral angiography showing totally occluded left SFA.  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 Arbie Cookey.    Echo 1/21 EF 50%, mild LVH, normal RV, PASP 60 mmHg.  Echo 4/22 EF 55-60% with basal to mid inferolateral hypokinesis.   Underwent PV angiogram with Dr. Allyson Sabal 7/22. She had drug-coated balloon angioplasty of the right SFA after being admitted for critical ischemia of the right foot.  Repeat peripheral arterial dopplers in 8/22 still showed severe right SFA disease.   Echo 4/24 EF 50-55%, RV normal.  Admitted 9/24 with UTI/sepsis. Required ICU care, BiPap and abx. Underwent RLE angiogram with femoropopliteal angioplasty and stenting.  Two admissions in 11/24 for AF/RVR s/p DCCV. EF was 45-50%, nl RV. She was d/c'd on Torsemide 100 bid w/o weekly metolazone and readmitted for volume overload. She was d/c'd with prn Metolazone, however CardioMems reading (PA 31) suggested she was not diuresed at discharge. Was instructed to use Furosicx at home. At follow up PA 36, she was switched back to Torsemide with metolazone.  She returns today for HF follow up. Overall feeling well. NYHA III. Reports some dyspnea and fatigue with exertion. Wearing compression socks, helps with edema. Biggest complaint is generalized joint pain and claudication. Denies chest pain, near-syncope, orthopnea,  palpitations, and syncope. Smoking 1 pack per week. Appetite okay. BP at home 110-120/60s. Compliant with all medications.  SH: Lives with sister.  Prior cocaine abuse.  Prior ETOH abuse.  Quit smoking in 1/17, restarted, and quit again in 4/19, restarted again.  FH: CAD  Review of systems complete and found to be negative unless listed in HPI.    Current Outpatient Medications  Medication Sig Dispense Refill   ACCU-CHEK GUIDE test strip USE TO CHECK BLOOD SUGAR FOUR TIMES DAILY     acetaminophen (TYLENOL) 500 MG tablet Take 1,000 mg by mouth every 6 (six) hours as needed for headache (pain).     albuterol (VENTOLIN HFA) 108 (90 Base) MCG/ACT inhaler USE 2 PUFFS BY MOUTH EVERY FOUR HOURS, AS NEEDED, FOR COUGHING/WHEEZING 8.5 g 0   allopurinol (ZYLOPRIM) 100 MG tablet Take 150 mg by mouth daily.     apixaban (ELIQUIS) 5 MG TABS tablet Take 1 tablet (5 mg total) by mouth 2 (two) times daily. 60 tablet 6   budesonide-formoterol (SYMBICORT) 160-4.5 MCG/ACT inhaler Inhale 2 puffs into the lungs 2 (two) times daily. 1 each 2   buPROPion ER (WELLBUTRIN SR) 100 MG 12 hr tablet Take 1 tablet (100 mg total) by mouth daily. 30 tablet 6   Cholecalciferol (VITAMIN D3 PO) Take 1 capsule by mouth every morning.     colchicine 0.6 MG tablet Take 1.2 mg by mouth See admin instructions. Take 1.2 mg for flair up and an hour later take 0.6 mg if needed     Continuous Glucose Receiver (FREESTYLE LIBRE 3 READER)  DEVI 1 Device by Does not apply route continuous. 1 each 0   Continuous Glucose Sensor (FREESTYLE LIBRE 3 PLUS SENSOR) MISC Change sensor every 15 days. 6 each 3   EASY COMFORT PEN NEEDLES 31G X 5 MM MISC USE FOUR TIMES A DAY FOR INSULIN ADMINISTRATION 100 each 1   fluticasone (FLONASE) 50 MCG/ACT nasal spray Place 1 spray into both nostrils as needed for allergies or rhinitis.     gabapentin (NEURONTIN) 100 MG capsule Take 1 capsule (100 mg total) by mouth 3 (three) times daily. 270 capsule 1   insulin  lispro (HUMALOG) 100 UNIT/ML KwikPen Inject 15 Units into the skin 3 (three) times daily with meals. Plus sliding scale, max dose 70 units /day 25 mL 1   LANTUS SOLOSTAR 100 UNIT/ML Solostar Pen Inject 37 Units into the skin daily. (Patient taking differently: Inject 50 Units into the skin daily.) 15 mL 0   levothyroxine (SYNTHROID) 50 MCG tablet TAKE 1 TABLET (50 MCG TOTAL) BY MOUTH DAILY BEFORE BREAKFAST (AM) 30 tablet 0   losartan (COZAAR) 25 MG tablet Take 1 tablet (25 mg total) by mouth daily. 30 tablet 11   metolazone (ZAROXOLYN) 2.5 MG tablet Take 1 tablet (2.5 mg total) by mouth once a week. Every Monday 15 tablet 3   metoprolol succinate (TOPROL-XL) 25 MG 24 hr tablet Take 1 tablet (25 mg total) by mouth daily. 90 tablet 3   Omega-3 Fatty Acids (FISH OIL PO) Take 1 tablet by mouth daily.     pantoprazole (PROTONIX) 40 MG tablet TAKE 1 TABLET (40 MG TOTAL) BY MOUTH DAILY(AM) 30 tablet 0   polyethylene glycol (MIRALAX / GLYCOLAX) 17 g packet Take 17 g by mouth daily as needed for moderate constipation. 14 each 0   potassium chloride SA (KLOR-CON M) 20 MEQ tablet Take 2 tablets (40 mEq total) by mouth once a week. With every dose of metolazone 8 tablet 11   rosuvastatin (CRESTOR) 40 MG tablet TAKE 1 TABLET (40 MG TOTAL) BY MOUTH DAILY (Patient taking differently: Take 40 mg by mouth at bedtime.) 15 tablet 0   spironolactone (ALDACTONE) 25 MG tablet Take 1 tablet (25 mg total) by mouth daily. 90 tablet 3   torsemide 40 MG TABS Take 80 mg by mouth 2 (two) times daily. 360 tablet 3   triamcinolone ointment (KENALOG) 0.1 % Apply topically 2 (two) times daily. 454 g 0   VITAMIN E PO Take 1 tablet by mouth daily.     Furosemide (FUROSCIX [place]) Inject into the skin as directed. As needed (Patient not taking: Reported on 02/08/2024)     No current facility-administered medications for this encounter.   BP (!) 154/70   Pulse 83   Ht 5\' 3"  (1.6 m)   Wt 97.3 kg (214 lb 9.6 oz)   LMP  (LMP Unknown)    SpO2 96%   BMI 38.01 kg/m   Wt Readings from Last 3 Encounters:  02/08/24 97.3 kg (214 lb 9.6 oz)  02/05/24 98.4 kg (217 lb)  01/29/24 95.3 kg (210 lb)   PHYSICAL EXAM: General: Weak appearing. No distress on RA. Arrived by [facility] Cardiac: JVP ~7cm. S1 and S2 present. No murmurs or rub. Abdomen: Soft, non-tender, non-distended.  Extremities: Warm and dry.  Trace BLE edema. + compression socks  Neuro: Alert and oriented x3. Affect pleasant.   Cardiomems (goal 20):  22 today (personally reviewed)  Assessment/Plan: 1. Chronic HF with mid range EF: LHC in 12/15 showing only 80% stenosis  in small OM1. EF as low as 40-45% in the past. Echo 4/22 showed EF 55-60% with grade 2 diastolic dysfunction.  Echo (4/24) EF 50-55%.  Echo in 11/24 with EF 45-50%, normal RV, mild MR, anterolateral akinesis.   - NYHA class III (chronic). Cardiomems right around goal 18-22 over past week. Submitting daily CardioMEMs readings. - Continue torsemide 80 mg bid. Stop weekly metolazone d/t elevated Cr.  - Continue Losartan 25 mg daily  - Continue spironolactone 25 mg daily, may need to hold if Cr dose not improve with reducing diuresis. - Off Jardiance due to severe UTIs and uncontrolled A1C. - Continue Toprol XL 25 mg daily.  - BNP/BMET. Repeat in 7-10 days.  2. Atrial fibrillation: Paroxysmal. RRR on exam  - Continue Eliquis 5 mg bid. Denies abnormal bleeding.  - Was referred to EP for AF ablation at last visit, has yet to be reviewed  3. PAD: Claudication bilaterally.  Not candidate for cilostazol with CHF.  She has had a left fem-pop bypass in 8/20.  ABIs in 2/21 were very abnormal on right. She underwent PV angiogram with balloon angioplasty of the right SFA 7/22.  8/22 dopplers still showed severe right SFA disease. Dopplers 2/23 showed patent right SFA.  Peripheral arterial dopplers in 2/23 showed 50-74% stenosis right SFA and popliteal.  S/p RLE femoropopliteal angioplasty and stenting in 9/24. She still  has bilateral foot ulcers.  - [facility], goes every 3 weeks. - Continue rosuvastatin.  - She is on apixaban so no ASA.   4. COPD:  Smoking 1 pack/week. Continue bupropion.  - Discussed smoking cessation.     5. H/o cirrhosis: Likely from ETOH, she has quit.   6. OSA: Not able to tolerate CPAP.    7. AKI on CKD stage III:  Baseline creatinine around 1.5 -elevated up to 2.8 over the last 2 months. Last Cr 2.01 -stop weekly metolazone as above, follow cardiomems -avoid hypotension -consider CK referral if does not improve, could be worsening kidney function d/t cardio-renal syndrome -BMET today  8. HTN:  - HTN in clinic today 140-150/60s. However BP by paramedicine historically 110-120/60s. Will not lower BP more with elevated Cr.   9.Type 2 diabetes: Per PCP.  She is on insulin.  10. CAD: LHC in 12/15 with 80% stenosis in small OM1, nonobstructive disease in other territories. No chest pain. - Continue rosuvastatin.  - Not on ASA with need for Eliquis.  11. Carotid stenosis: Stable 4/24 dopplers.  12. Hyperlipidemia: She is on Crestor and Vascepa.   Follow up with APP in 3-4 weeks with APP   [place] Haydyn Girvan, NP 02/08/2024

## 2024-02-08 NOTE — Patient Instructions (Signed)
 Stop metolazone. Stop potassium. Labs today - will call you if abnormal. Repeat labs in 7 - 10 days. See below. Return to Heart Failure APP Clinic in 3 - 4 weeks. See below. Please call us at [phone] if any questions or concerns prior to your next visit.

## 2024-02-09 ENCOUNTER — Encounter: Payer: Self-pay | Admitting: "Endocrinology

## 2024-02-13 ENCOUNTER — Other Ambulatory Visit (HOSPITAL_COMMUNITY): Payer: Self-pay

## 2024-02-13 ENCOUNTER — Ambulatory Visit: Payer: 59

## 2024-02-13 ENCOUNTER — Telehealth (HOSPITAL_COMMUNITY): Payer: Self-pay | Admitting: Adult Health

## 2024-02-13 NOTE — Telephone Encounter (Signed)
    Cardiomems Remote Monitoring  S/P Cardiomems Implant   PAD Goal: 20 Most recent reading: 26. Suggestive of fluid accumulation   Recommended changes: Please call and instruct to take an extra 20 mg of torsemide x 3 days.   I continue to review and analyze the patients PA pressures weekly (and more often as needed) to bring PA pressures within the optimal range.    Please call [name] with HF Paramedicine.    Justinian Miano NP_C  2:59 PM

## 2024-02-13 NOTE — Progress Notes (Signed)
 Paramedicine Encounter    Patient ID: [name], female    DOB: 1961-07-23, 63 y.o.   MRN: [id number]  [name] reviewed her cardiomems and her fluid is up.  She wants pt to take an extra 20mg  of torsemide for 3 days.  I went out to meet pt, she has only tonight's dose of meds left but she had some torsemide that was still in date that was kept prior to her moving to bubble packs. So she was able to get the extra 20mg  for a total of 100mg  this evening and I called pharmacy to get the bubble packs refilled and I will be sure she gets extra tabs of the torsemide for the  next 2 days as well.     Weight today-216 LMP  (LMP Unknown)  Weight yesterday-216 Last visit weight-214  Patient Care Team: Hillery Aldo, NP as PCP - General Allyson Sabal Delton See, MD as PCP - Cardiology (Cardiology) Laurey Morale, MD as PCP - Advanced Heart Failure (Cardiology) Regan Lemming, MD as PCP - Electrophysiology (Cardiology) Runell Gess, MD as Consulting Physician (Cardiology) Nyoka Cowden, MD as Consulting Physician (Pulmonary Disease) System, Provider Not In  Patient Active Problem List   Diagnosis Date Noted   Heart failure ([name]) 10/11/2023   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) and diastolic (congestive) heart failure ([name]) 10/10/2023   Pre-ulcerative calluses 04/25/2022   COPD (chronic obstructive pulmonary disease) ([name]) 05/05/2021   OSA (obstructive sleep apnea) 05/05/2021   Stage 3b chronic kidney disease ([name]) 05/05/2021   Pressure injury of skin 05/04/2021   Diabetic nephropathy ([name]) 01/02/2021   Low back pain 06/01/2020   Hyperlipidemia 04/03/2020   PAD (peripheral artery disease) ([name]) 12/26/2019   NICM (nonischemic cardiomyopathy) ([name]) 06/20/2019   Non-healing ulcer ([name]) 06/20/2019   Coagulation disorder ([name]) 08/09/2017   Depression 07/21/2017   At risk for adverse drug reaction 06/20/2017   Peripheral neuropathy 06/20/2017   S/P transmetatarsal amputation  of foot, right ([name]) 06/05/2017   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) 05/19/2017   Obesity, class 2 02/24/2016   Encounter for therapeutic drug monitoring 02/10/2016   Symptomatic bradycardia 01/12/2016   Essential hypertension 12/22/2015   Elevated troponin    Acute on chronic diastolic CHF (congestive heart failure) ([name])    Anemia of chronic disease 11/08/2015   Hypokalemia 11/08/2015   Tobacco abuse 10/23/2015   Coronary artery disease    DOE (dyspnea on exertion) 04/29/2015   PAF (paroxysmal atrial fibrillation) ([name]) 01/16/2015   Carotid arterial disease ([name]) 01/16/2015   Insomnia 02/03/2014   S/P peripheral artery angioplasty - TurboHawk atherectomy; R SFA 09/11/2013    Class: Acute   Leg pain, bilateral 08/19/2013   Hypothyroidism 07/31/2013   History of cocaine abuse ([name]) 06/13/2013   Long term current use of anticoagulant therapy 05/20/2013   Alcohol abuse    Narcotic abuse ([name])    Marijuana abuse    Alcoholic cirrhosis ([name])    Type 2 diabetes mellitus with hyperlipidemia ([name])     Current Outpatient Medications:    ACCU-CHEK GUIDE test strip, USE TO CHECK BLOOD SUGAR FOUR TIMES DAILY, Disp: , Rfl:    acetaminophen (TYLENOL) 500 MG tablet, Take 1,000 mg by mouth every 6 (six) hours as needed for headache (pain)., Disp: , Rfl:    albuterol (VENTOLIN HFA) 108 (90 Base) MCG/ACT inhaler, USE 2 PUFFS BY MOUTH EVERY FOUR HOURS, AS NEEDED, FOR COUGHING/WHEEZING, Disp: 8.5  g, Rfl: 0   allopurinol (ZYLOPRIM) 100 MG tablet, Take 150 mg by mouth daily., Disp: , Rfl:    apixaban (ELIQUIS) 5 MG TABS tablet, Take 1 tablet (5 mg total) by mouth 2 (two) times daily., Disp: 60 tablet, Rfl: 6   budesonide-formoterol (SYMBICORT) 160-4.5 MCG/ACT inhaler, Inhale 2 puffs into the lungs 2 (two) times daily., Disp: 1 each, Rfl: 2   buPROPion ER (WELLBUTRIN SR) 100 MG 12 hr tablet, Take 1 tablet (100 mg total) by mouth daily.,  Disp: 30 tablet, Rfl: 6   Cholecalciferol (VITAMIN D3 PO), Take 1 capsule by mouth every morning., Disp: , Rfl:    colchicine 0.6 MG tablet, Take 1.2 mg by mouth See admin instructions. Take 1.2 mg for flair up and an hour later take 0.6 mg if needed, Disp: , Rfl:    Continuous Glucose Receiver (FREESTYLE LIBRE 3 READER) DEVI, 1 Device by Does not apply route continuous., Disp: 1 each, Rfl: 0   Continuous Glucose Sensor (FREESTYLE LIBRE 3 PLUS SENSOR) MISC, Change sensor every 15 days., Disp: 6 each, Rfl: 3   EASY COMFORT PEN NEEDLES 31G X 5 MM MISC, USE FOUR TIMES A DAY FOR INSULIN ADMINISTRATION, Disp: 100 each, Rfl: 1   fluticasone (FLONASE) 50 MCG/ACT nasal spray, Place 1 spray into both nostrils as needed for allergies or rhinitis., Disp: , Rfl:    Furosemide (FUROSCIX [place]), Inject into the skin as directed. As needed (Patient not taking: Reported on 02/08/2024), Disp: , Rfl:    gabapentin (NEURONTIN) 100 MG capsule, Take 1 capsule (100 mg total) by mouth 3 (three) times daily., Disp: 270 capsule, Rfl: 1   insulin lispro (HUMALOG) 100 UNIT/ML KwikPen, Inject 15 Units into the skin 3 (three) times daily with meals. Plus sliding scale, max dose 70 units /day, Disp: 25 mL, Rfl: 1   LANTUS SOLOSTAR 100 UNIT/ML Solostar Pen, Inject 37 Units into the skin daily. (Patient taking differently: Inject 50 Units into the skin daily.), Disp: 15 mL, Rfl: 0   levothyroxine (SYNTHROID) 50 MCG tablet, TAKE 1 TABLET (50 MCG TOTAL) BY MOUTH DAILY BEFORE BREAKFAST (AM), Disp: 30 tablet, Rfl: 0   losartan (COZAAR) 25 MG tablet, Take 1 tablet (25 mg total) by mouth daily., Disp: 30 tablet, Rfl: 11   metoprolol succinate (TOPROL-XL) 25 MG 24 hr tablet, Take 1 tablet (25 mg total) by mouth daily., Disp: 90 tablet, Rfl: 3   Omega-3 Fatty Acids (FISH OIL PO), Take 1 tablet by mouth daily., Disp: , Rfl:    pantoprazole (PROTONIX) 40 MG tablet, TAKE 1 TABLET (40 MG TOTAL) BY MOUTH DAILY(AM), Disp: 30 tablet, Rfl: 0    polyethylene glycol (MIRALAX / GLYCOLAX) 17 g packet, Take 17 g by mouth daily as needed for moderate constipation., Disp: 14 each, Rfl: 0   rosuvastatin (CRESTOR) 40 MG tablet, TAKE 1 TABLET (40 MG TOTAL) BY MOUTH DAILY (Patient taking differently: Take 40 mg by mouth at bedtime.), Disp: 15 tablet, Rfl: 0   spironolactone (ALDACTONE) 25 MG tablet, Take 1 tablet (25 mg total) by mouth daily., Disp: 90 tablet, Rfl: 3   torsemide 40 MG TABS, Take 80 mg by mouth 2 (two) times daily., Disp: 360 tablet, Rfl: 3   triamcinolone ointment (KENALOG) 0.1 %, Apply topically 2 (two) times daily., Disp: 454 g, Rfl: 0   VITAMIN E PO, Take 1 tablet by mouth daily., Disp: , Rfl:  Allergies  Allergen Reactions   Gabapentin Nausea And Vomiting and Other (See Comments)  POSSIBLE SHAKING   Lyrica [Pregabalin] Other (See Comments)    Shaking       Social History   Socioeconomic History   Marital status: Single    Spouse name: Not on file   Number of children: 1   Years of education: 12   Highest education level: 12th grade  Occupational History   Occupation: disabled  Tobacco Use   Smoking status: Every Day    Current packs/day: 1.00    Average packs/day: 1 pack/day for 44.0 years (44.0 ttl pk-yrs)    Types: E-cigarettes, Cigarettes   Smokeless tobacco: Former    Types: Snuff  Vaping Use   Vaping status: Former   Devices: 11/26/2018 "stopped months ago"  Substance and Sexual Activity   Alcohol use: Not Currently    Comment: occ   Drug use: Not Currently    Types: "Crack" cocaine, Marijuana, Oxycodone   Sexual activity: Not Currently  Other Topics Concern   Not on file  Social History Narrative   ** Merged History Encounter **       Lives in [place], in motel with sister.  They are looking to move but don't have a place to go yet.     Social Drivers of Corporate investment banker Strain: Low Risk  (02/09/2023)   Overall Financial Resource Strain (CARDIA)    Difficulty of Paying Living  Expenses: Not hard at all  Food Insecurity: No Food Insecurity (10/10/2023)   Hunger Vital Sign    Worried About Running Out of Food in the Last Year: Never true    Ran Out of Food in the Last Year: Never true  Transportation Needs: No Transportation Needs (10/10/2023)   PRAPARE - Administrator, Civil Service (Medical): No    Lack of Transportation (Non-Medical): No  Physical Activity: Inactive (02/09/2023)   Exercise Vital Sign    Days of Exercise per Week: 0 days    Minutes of Exercise per Session: 0 min  Stress: No Stress Concern Present (02/09/2023)   [employer] of Occupational Health - Occupational Stress Questionnaire    Feeling of Stress : Not at all  Social Connections: Unknown (08/21/2023)   Received from [facility]   Social Network    Social Network: Not on file  Intimate Partner Violence: Not At Risk (10/10/2023)   Humiliation, Afraid, Rape, and Kick questionnaire    Fear of Current or Ex-Partner: No    Emotionally Abused: No    Physically Abused: No    Sexually Abused: No    Physical Exam      Future Appointments  Date Time Provider Department Center  02/21/2024  9:20 AM Altamese [place], MD [facility] None  02/21/2024 11:00 AM [facility] LAB [facility] None  02/29/2024 11:00 AM [facility] PA/NP [facility] None  03/19/2024  3:15 PM Scote[place], Meribeth Mattes, RD [facility] NDM  04/09/2024  3:00 PM [facility] 4 [facility]  04/09/2024  4:00 PM Leonie Douglas, MD [facility]       Kerry Hough, Paramedic [phone] [facility] Paramedic  02/13/24

## 2024-02-14 ENCOUNTER — Other Ambulatory Visit (HOSPITAL_COMMUNITY): Payer: Self-pay

## 2024-02-14 NOTE — Progress Notes (Signed)
 Paramedicine Encounter    Patient ID: [name], female    DOB: 1960-12-09, 63 y.o.   MRN: [id number]     Micah Flesher to pharm to get her bubble packs and the extra torsemide for today and tomor and took to her.  Will f/u next week.    Patient Care Team: Hillery Aldo, NP as PCP - General Allyson Sabal Delton See, MD as PCP - Cardiology (Cardiology) Laurey Morale, MD as PCP - Advanced Heart Failure (Cardiology) Regan Lemming, MD as PCP - Electrophysiology (Cardiology) Runell Gess, MD as Consulting Physician (Cardiology) Nyoka Cowden, MD as Consulting Physician (Pulmonary Disease) System, Provider Not In  Patient Active Problem List   Diagnosis Date Noted   Heart failure ([name]) 10/11/2023   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) and diastolic (congestive) heart failure ([name]) 10/10/2023   Pre-ulcerative calluses 04/25/2022   COPD (chronic obstructive pulmonary disease) ([name]) 05/05/2021   OSA (obstructive sleep apnea) 05/05/2021   Stage 3b chronic kidney disease ([name]) 05/05/2021   Pressure injury of skin 05/04/2021   Diabetic nephropathy ([name]) 01/02/2021   Low back pain 06/01/2020   Hyperlipidemia 04/03/2020   PAD (peripheral artery disease) ([name]) 12/26/2019   NICM (nonischemic cardiomyopathy) ([name]) 06/20/2019   Non-healing ulcer ([name]) 06/20/2019   Coagulation disorder ([name]) 08/09/2017   Depression 07/21/2017   At risk for adverse drug reaction 06/20/2017   Peripheral neuropathy 06/20/2017   S/P transmetatarsal amputation of foot, right ([name]) 06/05/2017   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) 05/19/2017   Obesity, class 2 02/24/2016   Encounter for therapeutic drug monitoring 02/10/2016   Symptomatic bradycardia 01/12/2016   Essential hypertension 12/22/2015   Elevated troponin    Acute on chronic diastolic CHF (congestive heart failure) ([name])    Anemia of chronic disease 11/08/2015   Hypokalemia  11/08/2015   Tobacco abuse 10/23/2015   Coronary artery disease    DOE (dyspnea on exertion) 04/29/2015   PAF (paroxysmal atrial fibrillation) ([name]) 01/16/2015   Carotid arterial disease ([name]) 01/16/2015   Insomnia 02/03/2014   S/P peripheral artery angioplasty - TurboHawk atherectomy; R SFA 09/11/2013    Class: Acute   Leg pain, bilateral 08/19/2013   Hypothyroidism 07/31/2013   History of cocaine abuse ([name]) 06/13/2013   Long term current use of anticoagulant therapy 05/20/2013   Alcohol abuse    Narcotic abuse ([name])    Marijuana abuse    Alcoholic cirrhosis ([name])    Type 2 diabetes mellitus with hyperlipidemia ([name])     Current Outpatient Medications:    ACCU-CHEK GUIDE test strip, USE TO CHECK BLOOD SUGAR FOUR TIMES DAILY, Disp: , Rfl:    acetaminophen (TYLENOL) 500 MG tablet, Take 1,000 mg by mouth every 6 (six) hours as needed for headache (pain)., Disp: , Rfl:    albuterol (VENTOLIN HFA) 108 (90 Base) MCG/ACT inhaler, USE 2 PUFFS BY MOUTH EVERY FOUR HOURS, AS NEEDED, FOR COUGHING/WHEEZING, Disp: 8.5 g, Rfl: 0   allopurinol (ZYLOPRIM) 100 MG tablet, Take 150 mg by mouth daily., Disp: , Rfl:    apixaban (ELIQUIS) 5 MG TABS tablet, Take 1 tablet (5 mg total) by mouth 2 (two) times daily., Disp: 60 tablet, Rfl: 6   budesonide-formoterol (SYMBICORT) 160-4.5 MCG/ACT inhaler, Inhale 2 puffs into the lungs 2 (two) times daily., Disp: 1 each, Rfl: 2   buPROPion ER (WELLBUTRIN SR) 100 MG 12 hr tablet, Take 1 tablet (100 mg total) by mouth daily., Disp: 30 tablet,  Rfl: 6   Cholecalciferol (VITAMIN D3 PO), Take 1 capsule by mouth every morning., Disp: , Rfl:    colchicine 0.6 MG tablet, Take 1.2 mg by mouth See admin instructions. Take 1.2 mg for flair up and an hour later take 0.6 mg if needed, Disp: , Rfl:    Continuous Glucose Receiver (FREESTYLE LIBRE 3 READER) DEVI, 1 Device by Does not apply route continuous., Disp: 1 each, Rfl: 0   Continuous Glucose Sensor (FREESTYLE LIBRE 3 PLUS  SENSOR) MISC, Change sensor every 15 days., Disp: 6 each, Rfl: 3   EASY COMFORT PEN NEEDLES 31G X 5 MM MISC, USE FOUR TIMES A DAY FOR INSULIN ADMINISTRATION, Disp: 100 each, Rfl: 1   fluticasone (FLONASE) 50 MCG/ACT nasal spray, Place 1 spray into both nostrils as needed for allergies or rhinitis., Disp: , Rfl:    Furosemide (FUROSCIX [place]), Inject into the skin as directed. As needed (Patient not taking: Reported on 02/08/2024), Disp: , Rfl:    gabapentin (NEURONTIN) 100 MG capsule, Take 1 capsule (100 mg total) by mouth 3 (three) times daily., Disp: 270 capsule, Rfl: 1   insulin lispro (HUMALOG) 100 UNIT/ML KwikPen, Inject 15 Units into the skin 3 (three) times daily with meals. Plus sliding scale, max dose 70 units /day, Disp: 25 mL, Rfl: 1   LANTUS SOLOSTAR 100 UNIT/ML Solostar Pen, Inject 37 Units into the skin daily. (Patient taking differently: Inject 50 Units into the skin daily.), Disp: 15 mL, Rfl: 0   levothyroxine (SYNTHROID) 50 MCG tablet, TAKE 1 TABLET (50 MCG TOTAL) BY MOUTH DAILY BEFORE BREAKFAST (AM), Disp: 30 tablet, Rfl: 0   losartan (COZAAR) 25 MG tablet, Take 1 tablet (25 mg total) by mouth daily., Disp: 30 tablet, Rfl: 11   metoprolol succinate (TOPROL-XL) 25 MG 24 hr tablet, Take 1 tablet (25 mg total) by mouth daily., Disp: 90 tablet, Rfl: 3   Omega-3 Fatty Acids (FISH OIL PO), Take 1 tablet by mouth daily., Disp: , Rfl:    pantoprazole (PROTONIX) 40 MG tablet, TAKE 1 TABLET (40 MG TOTAL) BY MOUTH DAILY(AM), Disp: 30 tablet, Rfl: 0   polyethylene glycol (MIRALAX / GLYCOLAX) 17 g packet, Take 17 g by mouth daily as needed for moderate constipation., Disp: 14 each, Rfl: 0   rosuvastatin (CRESTOR) 40 MG tablet, TAKE 1 TABLET (40 MG TOTAL) BY MOUTH DAILY (Patient taking differently: Take 40 mg by mouth at bedtime.), Disp: 15 tablet, Rfl: 0   spironolactone (ALDACTONE) 25 MG tablet, Take 1 tablet (25 mg total) by mouth daily., Disp: 90 tablet, Rfl: 3   torsemide 40 MG TABS, Take 80 mg  by mouth 2 (two) times daily., Disp: 360 tablet, Rfl: 3   triamcinolone ointment (KENALOG) 0.1 %, Apply topically 2 (two) times daily., Disp: 454 g, Rfl: 0   VITAMIN E PO, Take 1 tablet by mouth daily., Disp: , Rfl:  Allergies  Allergen Reactions   Gabapentin Nausea And Vomiting and Other (See Comments)    POSSIBLE SHAKING   Lyrica [Pregabalin] Other (See Comments)    Shaking       Social History   Socioeconomic History   Marital status: Single    Spouse name: Not on file   Number of children: 1   Years of education: 12   Highest education level: 12th grade  Occupational History   Occupation: disabled  Tobacco Use   Smoking status: Every Day    Current packs/day: 1.00    Average packs/day: 1 pack/day for 44.0  years (44.0 ttl pk-yrs)    Types: E-cigarettes, Cigarettes   Smokeless tobacco: Former    Types: Snuff  Vaping Use   Vaping status: Former   Devices: 11/26/2018 "stopped months ago"  Substance and Sexual Activity   Alcohol use: Not Currently    Comment: occ   Drug use: Not Currently    Types: "Crack" cocaine, Marijuana, Oxycodone   Sexual activity: Not Currently  Other Topics Concern   Not on file  Social History Narrative   ** Merged History Encounter **       Lives in [place], in motel with sister.  They are looking to move but don't have a place to go yet.     Social Drivers of Corporate investment banker Strain: Low Risk  (02/09/2023)   Overall Financial Resource Strain (CARDIA)    Difficulty of Paying Living Expenses: Not hard at all  Food Insecurity: No Food Insecurity (10/10/2023)   Hunger Vital Sign    Worried About Running Out of Food in the Last Year: Never true    Ran Out of Food in the Last Year: Never true  Transportation Needs: No Transportation Needs (10/10/2023)   PRAPARE - Administrator, Civil Service (Medical): No    Lack of Transportation (Non-Medical): No  Physical Activity: Inactive (02/09/2023)   Exercise Vital Sign    Days  of Exercise per Week: 0 days    Minutes of Exercise per Session: 0 min  Stress: No Stress Concern Present (02/09/2023)   [employer] of Occupational Health - Occupational Stress Questionnaire    Feeling of Stress : Not at all  Social Connections: Unknown (08/21/2023)   [facility]   Social Network    Social Network: Not on file  Intimate Partner Violence: Not At Risk (10/10/2023)   Humiliation, Afraid, Rape, and Kick questionnaire    Fear of Current or Ex-Partner: No    Emotionally Abused: No    Physically Abused: No    Sexually Abused: No    Physical Exam      Future Appointments  Date Time Provider Department Center  02/21/2024  9:20 AM Altamese Anchor, MD [facility] None  02/21/2024 11:00 AM [facility] LAB [facility] None  02/29/2024 11:00 AM [facility] PA/NP [facility] None  03/19/2024  3:15 PM Scotece, Meribeth Mattes, RD [facility] NDM  04/09/2024  3:00 PM [facility] 4 [facility]  04/09/2024  4:00 PM Leonie Douglas, MD [facility]       Kerry Hough, Paramedic [phone] [facility] Paramedic  02/14/24

## 2024-02-21 ENCOUNTER — Ambulatory Visit (INDEPENDENT_AMBULATORY_CARE_PROVIDER_SITE_OTHER): Admitting: "Endocrinology

## 2024-02-21 ENCOUNTER — Encounter: Payer: Self-pay | Admitting: "Endocrinology

## 2024-02-21 ENCOUNTER — Ambulatory Visit (HOSPITAL_COMMUNITY)
Admission: RE | Admit: 2024-02-21 | Discharge: 2024-02-21 | Disposition: A | Discharge status: Home / Self Care | Source: Ambulatory Visit | Attending: Cardiology | Admitting: Cardiology

## 2024-02-21 VITALS — BP 136/80 | HR 63 | Ht 63.0 in | Wt 214.0 lb

## 2024-02-21 DIAGNOSIS — E782 Mixed hyperlipidemia: Secondary | ICD-10-CM | POA: Diagnosis not present

## 2024-02-21 DIAGNOSIS — I5021 Acute systolic (congestive) heart failure: Secondary | ICD-10-CM | POA: Insufficient documentation

## 2024-02-21 DIAGNOSIS — Z794 Long term (current) use of insulin: Secondary | ICD-10-CM | POA: Diagnosis not present

## 2024-02-21 DIAGNOSIS — Z7985 Long-term (current) use of injectable non-insulin antidiabetic drugs: Secondary | ICD-10-CM

## 2024-02-21 DIAGNOSIS — E1165 Type 2 diabetes mellitus with hyperglycemia: Secondary | ICD-10-CM

## 2024-02-21 LAB — BASIC METABOLIC PANEL WITH GFR
Anion gap: 14 (ref 5–15)
BUN: 112 mg/dL — ABNORMAL HIGH (ref 8–23)
CO2: 24 mmol/L (ref 22–32)
Calcium: 9.2 mg/dL (ref 8.9–10.3)
Chloride: 100 mmol/L (ref 98–111)
Creatinine, Ser: 2.49 mg/dL — ABNORMAL HIGH (ref 0.44–1.00)
GFR, Estimated: 21 mL/min — ABNORMAL LOW (ref >60)
Glucose, Bld: 246 mg/dL — ABNORMAL HIGH (ref 70–99)
Potassium: 4.5 mmol/L (ref 3.5–5.1)
Sodium: 138 mmol/L (ref 135–145)

## 2024-02-21 MED ORDER — TIRZEPATIDE 5 MG/0.5ML ~~LOC~~ SOAJ: 5.0000 mg | SUBCUTANEOUS | 0 refills | Status: DC

## 2024-02-21 NOTE — Patient Instructions (Addendum)
 Will recommend the following: Lantus 60 units at bedtime Humalog 18 units tid 15 min before meals Mounjaro 2.5 mg/week -> increase to 5 mg after 4 weeks  _________   Goals of DM therapy:  Morning Fasting blood sugar: 80-140  Blood sugar before meals: 80-140 Bed time blood sugar: 100-150  A1C <7%, limited only by hypoglycemia  1.Diabetes medications and their side effects discussed, including hypoglycemia    2. Check blood glucose:  a) Always check blood sugars before driving. Please see below (under hypoglycemia) on how to manage b) Check a minimum of 3 times/day or more as needed when having symptoms of hypoglycemia.   c) Try to check blood glucose before sleeping/in the middle of the night to ensure that it is remaining stable and not dropping less than 100 d) Check blood glucose more often if sick  3. Diet: a) 3 meals per day schedule b: Restrict carbs to 60-70 grams (4 servings) per meal c) Colorful vegetables - 3 servings a day, and low sugar fruit 2 servings/day Plate control method: 1/4 plate protein, 1/4 starch, 1/2 green, yellow, or red vegetables d) Avoid carbohydrate snacks unless hypoglycemic episode, or increased physical activity  4. Regular exercise as tolerated, preferably 3 or more hours a week  5. Hypoglycemia: a)  Do not drive or operate machinery without first testing blood glucose to assure it is over 90 mg%, or if dizzy, lightheaded, not feeling normal, etc, or  if foot or leg is numb or weak. b)  If blood glucose less than 70, take four 5gm Glucose tabs or 15-30 gm Glucose gel.  Repeat every 15 min as needed until blood sugar is >100 mg/dl. If hypoglycemia persists then call 911.   6. Sick day management: a) Check blood glucose more often b) Continue usual therapy if blood sugars are elevated.   7. Contact the doctor immediately if blood glucose is frequently <60 mg/dl, or an episode of severe hypoglycemia occurs (where someone had to give you glucose/   glucagon or if you passed out from a low blood glucose), or if blood glucose is persistently >350 mg/dl, for further management  8. A change in level of physical activity or exercise and a change in diet may also affect your blood sugar. Check blood sugars more often and call if needed.  Instructions: 1. Bring glucose meter, blood glucose records on every visit for review 2. Continue to follow up with primary care physician and other providers for medical care 3. Yearly eye  and foot exam 4. Please get blood work done prior to the next appointment

## 2024-02-21 NOTE — Progress Notes (Signed)
 Outpatient Endocrinology Note Karla Northfield, MD  02/21/24   [name] 1961-09-15 [id number]  Referring Provider: Hillery Aldo, NP Primary Care Provider: Hillery Aldo, NP Reason for consultation: Subjective   Assessment & Plan  Diagnoses and all orders for this visit:  Uncontrolled type 2 diabetes mellitus with hyperglycemia ([name]) -     Lipid panel  Long-term insulin use ([name])  Long-term (current) use of injectable non-insulin antidiabetic drugs  Mixed hypercholesterolemia and hypertriglyceridemia -     Lipid panel  Other orders -     tirzepatide [facility]) 5 MG/0.5ML Pen; Inject 5 mg into the skin once a week.    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, No results found for: "GFR" Hba1c goal less than 7, current Hba1c is  Lab Results  Component Value Date   HGBA1C 12.6 (H) 01/23/2024   Will recommend the following: Lantus 60 units at bedtime Humalog 18 units tid 15 min before meals Mounjaro 2.5 mg/week -> increase to 5 mg after 4 weeks  Has libre 01/25/24 Ordered DM education   No known contraindications/side effects to any of above medications Glucagon discussed and prescribed with refills on 01/25/24   No history of MEN syndrome/medullary thyroid cancer/pancreatitis or pancreatic cancer in self or family  -Last LD and Tg are as follows: Lab Results  Component Value Date   LDLCALC 91 01/23/2024    Lab Results  Component Value Date   TRIG 384 (H) 01/23/2024   -On rosuvastatin 40 mg every day, takes one fish oil  -Follow low fat diet and exercise   -Blood pressure goal <140/90 - Microalbumin/creatinine goal is < 30 -Last MA/Cr is as follows: Lab Results  Component Value Date   MICROALBUR 9.0 01/23/2024   -on ACE/ARB losartan 25 mg every day  -diet changes including salt restriction -limit eating outside -counseled BP targets per standards of diabetes care -uncontrolled blood pressure can lead to retinopathy, nephropathy and  cardiovascular and atherosclerotic heart disease  Reviewed and counseled on: -A1C target -Blood sugar targets -Complications of uncontrolled diabetes  -Checking blood sugar before meals and bedtime and bring log next visit -All medications with mechanism of action and side effects -Hypoglycemia management: rule of 15's, Glucagon Emergency Kit and medical alert ID -low-carb low-fat plate-method diet -At least 20 minutes of physical activity per day -Annual dilated retinal eye exam and foot exam -compliance and follow up needs -follow up as scheduled or earlier if problem gets worse  Call if blood sugar is less than 70 or consistently above 250    Take a 15 gm snack of carbohydrate at bedtime before you go to sleep if your blood sugar is less than 100.    If you are going to fast after midnight for a test or procedure, ask your physician for instructions on how to reduce/decrease your insulin dose.    Call if blood sugar is less than 70 or consistently above 250  -Treating a low sugar by rule of 15  (15 gms of sugar every 15 min until sugar is more than 70) If you feel your sugar is low, test your sugar to be sure If your sugar is low (less than 70), then take 15 grams of a fast acting Carbohydrate (3-4 glucose tablets or glucose gel or 4 ounces of juice or regular soda) Recheck your sugar 15 min after treating low to make sure it is more than 70 If sugar is still less than 70, treat again with 15 grams of  carbohydrate          Don't drive the hour of hypoglycemia  If unconscious/unable to eat or drink by mouth, use glucagon injection or nasal spray baqsimi and call 911. Can repeat again in 15 min if still unconscious.  Return in about 13 days (around 03/05/2024) for visit and 8 am labs before next visit.   I have reviewed current medications, nurse's notes, allergies, vital signs, past medical and surgical history, family medical history, and social history for this encounter.  Counseled patient on symptoms, examination findings, lab findings, imaging results, treatment decisions and monitoring and prognosis. The patient understood the recommendations and agrees with the treatment plan. All questions regarding treatment plan were fully answered.  Karla , MD  02/21/24    History of Present Illness [name] is a 63 y.o. year old female who presents for evaluation of Type II diabetes mellitus.  [name] was first diagnosed around 2014.   Diabetes education +  Home diabetes regimen: Lantus 50 units at bedtime Humalog 15 units tid 20 min before meals  Mounjaro 25 mg /week -started by another provider    COMPLICATIONS -  MI/Stroke +  retinopathy +  neuropathy +  nephropathy  SYMPTOMS REVIEWED + Polyuria - Weight loss + Blurred vision  BLOOD SUGAR DATA  CGM interpretation: At today's visit, we reviewed her CGM downloads. The full report is scanned in the media. Reviewing the CGM trends, BG are elevated across the day.  Physical Exam  BP 136/80   Pulse 63   Ht 5\' 3"  (1.6 m)   Wt 214 lb (97.1 kg)   LMP  (LMP Unknown)   SpO2 98%   BMI 37.91 kg/m    Constitutional: well developed, well nourished Head: normocephalic, atraumatic Eyes: sclera anicteric, no redness Neck: supple Lungs: normal respiratory effort Neurology: alert and oriented Skin: dry, no appreciable rashes Musculoskeletal: no appreciable defects Psychiatric: normal mood and affect Diabetic Foot Exam - Simple   No data filed      Current Medications Patient's Medications  New Prescriptions   TIRZEPATIDE (MOUNJARO) 5 MG/0.5ML PEN    Inject 5 mg into the skin once a week.  Previous Medications   ACCU-CHEK GUIDE TEST STRIP    USE TO CHECK BLOOD SUGAR FOUR TIMES DAILY   ACETAMINOPHEN (TYLENOL) 500 MG TABLET    Take 1,000 mg by mouth every 6 (six) hours as needed for headache (pain).   ALBUTEROL (VENTOLIN HFA) 108 (90 BASE) MCG/ACT INHALER    USE 2 PUFFS BY MOUTH  EVERY FOUR HOURS, AS NEEDED, FOR COUGHING/WHEEZING   ALLOPURINOL (ZYLOPRIM) 100 MG TABLET    Take 150 mg by mouth daily.   APIXABAN (ELIQUIS) 5 MG TABS TABLET    Take 1 tablet (5 mg total) by mouth 2 (two) times daily.   BUDESONIDE-FORMOTEROL (SYMBICORT) 160-4.5 MCG/ACT INHALER    Inhale 2 puffs into the lungs 2 (two) times daily.   BUPROPION ER (WELLBUTRIN SR) 100 MG 12 HR TABLET    Take 1 tablet (100 mg total) by mouth daily.   CHOLECALCIFEROL (VITAMIN D3 PO)    Take 1 capsule by mouth every morning.   COLCHICINE 0.6 MG TABLET    Take 1.2 mg by mouth See admin instructions. Take 1.2 mg for flair up and an hour later take 0.6 mg if needed   CONTINUOUS GLUCOSE RECEIVER (FREESTYLE LIBRE 3 READER) DEVI    1 Device by Does not apply route continuous.   CONTINUOUS GLUCOSE  SENSOR (FREESTYLE LIBRE 3 PLUS SENSOR) MISC    Change sensor every 15 days.   EASY COMFORT PEN NEEDLES 31G X 5 MM MISC    USE FOUR TIMES A DAY FOR INSULIN ADMINISTRATION   FLUTICASONE (FLONASE) 50 MCG/ACT NASAL SPRAY    Place 1 spray into both nostrils as needed for allergies or rhinitis.   FUROSEMIDE (FUROSCIX [place])    Inject into the skin as directed. As needed   GABAPENTIN (NEURONTIN) 100 MG CAPSULE    Take 1 capsule (100 mg total) by mouth 3 (three) times daily.   INSULIN LISPRO (HUMALOG) 100 UNIT/ML KWIKPEN    Inject 15 Units into the skin 3 (three) times daily with meals. Plus sliding scale, max dose 70 units /day   LANTUS SOLOSTAR 100 UNIT/ML SOLOSTAR PEN    Inject 37 Units into the skin daily.   LEVOTHYROXINE (SYNTHROID) 50 MCG TABLET    TAKE 1 TABLET (50 MCG TOTAL) BY MOUTH DAILY BEFORE BREAKFAST (AM)   LOSARTAN (COZAAR) 25 MG TABLET    Take 1 tablet (25 mg total) by mouth daily.   METOPROLOL SUCCINATE (TOPROL-XL) 25 MG 24 HR TABLET    Take 1 tablet (25 mg total) by mouth daily.   OMEGA-3 FATTY ACIDS (FISH OIL PO)    Take 1 tablet by mouth daily.   PANTOPRAZOLE (PROTONIX) 40 MG TABLET    TAKE 1 TABLET (40 MG TOTAL) BY MOUTH  DAILY(AM)   POLYETHYLENE GLYCOL (MIRALAX / GLYCOLAX) 17 G PACKET    Take 17 g by mouth daily as needed for moderate constipation.   ROSUVASTATIN (CRESTOR) 40 MG TABLET    TAKE 1 TABLET (40 MG TOTAL) BY MOUTH DAILY   SPIRONOLACTONE (ALDACTONE) 25 MG TABLET    Take 1 tablet (25 mg total) by mouth daily.   TORSEMIDE 40 MG TABS    Take 80 mg by mouth 2 (two) times daily.   TRIAMCINOLONE OINTMENT (KENALOG) 0.1 %    Apply topically 2 (two) times daily.   VITAMIN E PO    Take 1 tablet by mouth daily.  Modified Medications   No medications on file  Discontinued Medications   MOUNJARO 2.5 MG/0.5ML PEN    INJECT 2.5 MG BY SUBCUTANEOUS ROUTE ONCE A WEEK FOR 4 WEEKS THEN INCREASE TO 5 MG WEEKLY.    Allergies Allergies  Allergen Reactions   Gabapentin Nausea And Vomiting and Other (See Comments)    POSSIBLE SHAKING   Lyrica [Pregabalin] Other (See Comments)    Shaking     Past Medical History Past Medical History:  Diagnosis Date   Acute renal failure ([name])    Acute respiratory failure with hypoxia ([name]) 12/02/2019   Alcohol abuse    Alcoholic cirrhosis ([name])    Anemia    Anxiety    Arthritis    "knees" (11/26/2018)   B12 deficiency    CAD (coronary artery disease)    a. 11/10/2014 Cath: LM nl, LAD min irregs, D1 30 ost, D2 50d, LCX 33m, OM1 80 p/m (1.5 mm vessel), OM2 30m, RCA nondom 27m-->med rx.. Demand ischemia in the setting of rapid a-fib.   Cardiomyopathy ([name])    Carotid artery disease ([name])    a. 01/2015 Carotid Angio: RICA 100, LICA 95p; b. 02/2015 s/p L CEA; c. 05/2019 Carotid U/S: RICA 100. RECA >50. LICA 1-39%.   Cellulitis    lower extremities   Cellulitis in diabetic foot ([name]) 07/08/2019   CHF (congestive heart failure) ([name])    Chronic combined  systolic and diastolic CHF (congestive heart failure) ([name])    a. 10/2014 Echo: EF 40-45%; b. 10/2018 Echo: EF 45-50%, gr2 DD; c. 11/2019 Echo: EF 50%, mild LVH, gr2 DD (restrictive), antlat HK, Nl RV fxn. Mild BAE. RVSP 59.73mmHg.    CKD (chronic kidney disease), stage III ([name])    Cocaine abuse ([name])    COPD (chronic obstructive pulmonary disease) ([name])    Critical ischemia of foot ([name]) 06/07/2021   Critical limb ischemia     Critical lower limb ischemia ([name])    Demand ischemia ([name]) 10/29/2014   Depression    Diabetes mellitus without complication ([name])    Diabetic peripheral neuropathy ([name])    DVT (deep venous thrombosis) ([name])    Dyspnea    Elevated troponin    a. Chronic elevation.   Fall 05/05/2021   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[name])    Peripheral arterial disease/critical limb ischemia     GERD (gastroesophageal reflux disease)    Hyperlipemia    Hypertension    Hypokalemia    Hypomagnesemia    Hypothyroidism    Marijuana abuse    Narcotic abuse ([name])    Noncompliance    NSVT (nonsustained ventricular tachycardia) ([name])    Obesity    Osteomyelitis ([name]) 06/21/2019   PAF (paroxysmal atrial fibrillation) ([name])    Paroxysmal atrial tachycardia ([name])    Peripheral arterial disease ([name])    a. 01/2015 Angio/PTA: RSFA 99 (atherectomy/pta) - 1 vessel runoff via diff dzs peroneal; b. 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   Pneumonia    "once or twice" (11/26/2018)   Poorly controlled type 2 diabetes mellitus ([name])    Renal disorder    Renal insufficiency    a. 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   SIRS (systemic inflammatory response syndrome) ([name]) 04/06/2017   Sleep apnea    "couldn't handle wearing the mask" (11/26/2018)   Symptomatic bradycardia    a. Avoid AV blocking agent per EP. Prev req temp wire in 2017.   Tobacco abuse    Wrist fracture, left, closed, initial encounter 01/29/2015    Past Surgical History Past Surgical History:  Procedure Laterality Date   ABDOMINAL AORTOGRAM N/A 06/26/2019   Procedure: ABDOMINAL AORTOGRAM;  Surgeon: Iran Ouch, MD;  Location: [facility];  Service: Cardiovascular;  Laterality: N/A;   ABDOMINAL AORTOGRAM W/LOWER  EXTREMITY N/A 06/07/2021   Procedure: ABDOMINAL AORTOGRAM W/LOWER EXTREMITY;  Surgeon: Runell Gess, MD;  Location: [facility];  Service: Cardiovascular;  Laterality: N/A;   ABDOMINAL AORTOGRAM W/LOWER EXTREMITY N/A 08/11/2023   Procedure: ABDOMINAL AORTOGRAM W/LOWER EXTREMITY;  Surgeon: Leonie Douglas, MD;  Location: [facility];  Service: Cardiovascular;  Laterality: N/A;   AMPUTATION Right 06/14/2017   Procedure: Right foot transmetatarsal amputation;  Surgeon: Nadara Mustard, MD;  Location: [facility] OR;  Service: Orthopedics;  Laterality: Right;   AMPUTATION Left 06/28/2019   Procedure: AMPUTATION LEFT FIFTH TOE;  Surgeon: Larina Earthly, MD;  Location: [facility] OR;  Service: Vascular;  Laterality: Left;   AMPUTATION TOE Right 04/28/2017   Procedure: AMPUTATION OF RIGHT SECOND RAY;  Surgeon: Nadara Mustard, MD;  Location[facility] OR;  Service: Orthopedics;  Laterality: Right;   CARDIAC CATHETERIZATION     CARDIAC CATHETERIZATION N/A 01/12/2016   Procedure: Temporary Wire;  Surgeon: Rollene Rotunda, MD;  Location: [facility];  Service: Cardiovascular;  Laterality: N/A;   CARDIOVERSION  ~ 02/2013   "twice"  CARDIOVERSION N/A 09/26/2023   Procedure: CARDIOVERSION (CATH LAB);  Surgeon: Maisie Fus, MD;  [facility] INVASIVE CV LAB;  Service: Cardiovascular;  Laterality: N/A;   CAROTID ANGIOGRAM N/A 01/15/2015   Procedure: CAROTID ANGIOGRAM;  Surgeon: Runell Gess, MD;  Location: [facility] CATH LAB;  Service: Cardiovascular;  Laterality: N/A;   DILATION AND CURETTAGE OF UTERUS  1988   ENDARTERECTOMY Left 02/19/2015   Procedure: LEFT CAROTID ENDARTERECTOMY ;  Surgeon: Nada Libman, MD;  Location: [facility] OR;  Service: Vascular;  Laterality: Left;   FEMORAL-TIBIAL BYPASS GRAFT Left 06/28/2019   Procedure: BYPASS GRAFT LEFT LEG FEMORAL TO ANTERIOR TIBIAL ARTERY using LEFT GREATER SAPHENOUS VEIN;  Surgeon: Larina Earthly, MD;  Location: [facility] OR;  Service: Vascular;  Laterality: Left;   LEFT HEART  CATHETERIZATION WITH CORONARY ANGIOGRAM N/A 10/31/2014   Procedure: LEFT HEART CATHETERIZATION WITH CORONARY ANGIOGRAM;  Surgeon: Kathleene Hazel, MD;  Location: [facility] CATH LAB;  Service: Cardiovascular;  Laterality: N/A;   LOWER EXTREMITY ANGIOGRAM N/A 09/10/2013   Procedure: LOWER EXTREMITY ANGIOGRAM;  Surgeon: Runell Gess, MD;  Location: [facility] CATH LAB;  Service: Cardiovascular;  Laterality: N/A;   LOWER EXTREMITY ANGIOGRAM N/A 01/15/2015   Procedure: LOWER EXTREMITY ANGIOGRAM;  Surgeon: Runell Gess, MD;  Location: [facility] CATH LAB;  Service: Cardiovascular;  Laterality: N/A;   LOWER EXTREMITY ANGIOGRAPHY N/A 04/13/2017   Procedure: Lower Extremity Angiography;  Surgeon: Runell Gess, MD;  Location: [facility] INVASIVE CV LAB;  Service: Cardiovascular;  Laterality: N/A;   LOWER EXTREMITY ANGIOGRAPHY Left 06/26/2019   Procedure: LOWER EXTREMITY ANGIOGRAPHY;  Surgeon: Iran Ouch, MD;  Location: [facility];  Service: Cardiovascular;  Laterality: Left;   PERIPHERAL VASCULAR ATHERECTOMY Right 06/07/2021   Procedure: PERIPHERAL VASCULAR ATHERECTOMY;  Surgeon: Runell Gess, MD;  Location: [facility] INVASIVE CV LAB;  Service: Cardiovascular;  Laterality: Right;   PERIPHERAL VASCULAR BALLOON ANGIOPLASTY Left 06/26/2019   Procedure: PERIPHERAL VASCULAR BALLOON ANGIOPLASTY;  Surgeon: Iran Ouch, MD;  Location: [facility];  Service: Cardiovascular;  Laterality: Left;  unable to cross lt sfa occlusion   PERIPHERAL VASCULAR INTERVENTION  04/13/2017   Procedure: Peripheral Vascular Intervention;  Surgeon: Runell Gess, MD;  Location: [facility] INVASIVE CV LAB;  Service: Cardiovascular;;   PERIPHERAL VASCULAR INTERVENTION  08/11/2023   Procedure: PERIPHERAL VASCULAR INTERVENTION;  Surgeon: Leonie Douglas, MD;  Location: [facility];  Service: Cardiovascular;;   PRESSURE SENSOR/CARDIOMEMS N/A 02/05/2020   Procedure: PRESSURE SENSOR/CARDIOMEMS;  Surgeon: Laurey Morale, MD;   Location: [facility] INVASIVE CV LAB;  Service: Cardiovascular;  Laterality: N/A;   VEIN HARVEST Left 06/28/2019   Procedure: LEFT LEG GREATER SAPHENOUS VEIN HARVEST;  Surgeon: Larina Earthly, MD;  Location: [facility] OR;  Service: Vascular;  Laterality: Left;    Family History family history includes Breast cancer in her mother; Cancer in her mother; Clotting disorder in her mother; Diabetes in her mother; Emphysema in her sister; Heart attack in her mother; Heart disease in her father and mother; Hypertension in her father and mother.  Social History Social History   Socioeconomic History   Marital status: Single    Spouse name: Not on file   Number of children: 1   Years of education: 12   Highest education level: 12th grade  Occupational History   Occupation: disabled  Tobacco Use   Smoking status: Every Day    Current packs/day: 1.00    Average packs/day: 1 pack/day for 44.0 years (  44.0 ttl pk-yrs)    Types: E-cigarettes, Cigarettes   Smokeless tobacco: Former    Types: Snuff  Vaping Use   Vaping status: Former   Devices: 11/26/2018 "stopped months ago"  Substance and Sexual Activity   Alcohol use: Not Currently    Comment: occ   Drug use: Not Currently    Types: "Crack" cocaine, Marijuana, Oxycodone   Sexual activity: Not Currently  Other Topics Concern   Not on file  Social History Narrative   ** Merged History Encounter **       Lives in [place], in motel with sister.  They are looking to move but don't have a place to go yet.     Social Drivers of Corporate investment banker Strain: Low Risk  (02/09/2023)   Overall Financial Resource Strain (CARDIA)    Difficulty of Paying Living Expenses: Not hard at all  Food Insecurity: No Food Insecurity (10/10/2023)   Hunger Vital Sign    Worried About Running Out of Food in the Last Year: Never true    Ran Out of Food in the Last Year: Never true  Transportation Needs: No Transportation Needs (10/10/2023)   PRAPARE - Therapist, art (Medical): No    Lack of Transportation (Non-Medical): No  Physical Activity: Inactive (02/09/2023)   Exercise Vital Sign    Days of Exercise per Week: 0 days    Minutes of Exercise per Session: 0 min  Stress: No Stress Concern Present (02/09/2023)   [employer] of Occupational Health - Occupational Stress Questionnaire    Feeling of Stress : Not at all  Social Connections: Unknown (08/21/2023)   Received from [facility]   Social Network    Social Network: Not on file  Intimate Partner Violence: Not At Risk (10/10/2023)   Humiliation, Afraid, Rape, and Kick questionnaire    Fear of Current or Ex-Partner: No    Emotionally Abused: No    Physically Abused: No    Sexually Abused: No    Lab Results  Component Value Date   HGBA1C 12.6 (H) 01/23/2024   HGBA1C 12.0 (H) 09/25/2023   HGBA1C 11.1 (H) 07/27/2023   Lab Results  Component Value Date   CHOL 189 01/23/2024   Lab Results  Component Value Date   HDL 47 (L) 01/23/2024   Lab Results  Component Value Date   LDLCALC 91 01/23/2024   Lab Results  Component Value Date   TRIG 384 (H) 01/23/2024   Lab Results  Component Value Date   CHOLHDL 4.0 01/23/2024   Lab Results  Component Value Date   CREATININE 2.21 (H) 02/08/2024   No results found for: "GFR" Lab Results  Component Value Date   MICROALBUR 9.0 01/23/2024      Component Value Date/Time   NA 133 (L) 02/08/2024 1215   NA 140 09/07/2022 1144   K 4.7 02/08/2024 1215   CL 93 (L) 02/08/2024 1215   CO2 26 02/08/2024 1215   GLUCOSE 415 (H) 02/08/2024 1215   BUN 90 (H) 02/08/2024 1215   BUN 26 (H) 09/07/2022 1144   CREATININE 2.21 (H) 02/08/2024 1215   CREATININE 2.01 (H) 01/23/2024 1444   CALCIUM 9.6 02/08/2024 1215   PROT 6.8 01/23/2024 1444   PROT 6.7 07/07/2022 1155   ALBUMIN 3.0 (L) 01/03/2024 1645   ALBUMIN 4.0 07/07/2022 1155   AST 19 01/23/2024 1444   ALT 23 01/23/2024 1444   ALKPHOS 112 01/03/2024 1645   BILITOT  0.3 01/23/2024 1444   BILITOT <0.2 07/07/2022 1155   GFRNONAA 25 (L) 02/08/2024 1215   GFRAA 39 (L) 06/08/2020 1130      Latest Ref Rng & Units 02/08/2024   12:15 PM 01/23/2024    2:44 PM 01/03/2024    5:00 PM  BMP  Glucose 70 - 99 mg/dL 629  528  413   BUN 8 - 23 mg/dL 90  79  244   Creatinine 0.44 - 1.00 mg/dL 0.10  2.72  5.36   BUN/Creat Ratio 6 - 22 (calc)  39    Sodium 135 - 145 mmol/L 133  138  131    131   Potassium 3.5 - 5.1 mmol/L 4.7  3.9  3.8    3.8   Chloride 98 - 111 mmol/L 93  96  89   CO2 22 - 32 mmol/L 26  27    Calcium 8.9 - 10.3 mg/dL 9.6  9.9         Component Value Date/Time   WBC 12.0 (H) 01/03/2024 1645   RBC 3.81 (L) 01/03/2024 1645   HGB 11.6 (L) 01/03/2024 1700   HGB 11.2 (L) 01/03/2024 1700   HGB 10.5 (L) 05/25/2021 1444   HCT 34.0 (L) 01/03/2024 1700   HCT 33.0 (L) 01/03/2024 1700   HCT 35.5 05/25/2021 1444   PLT 236 01/03/2024 1645   PLT 368 05/25/2021 1444   MCV 87.9 01/03/2024 1645   MCV 88 05/25/2021 1444   MCH 28.6 01/03/2024 1645   MCHC 32.5 01/03/2024 1645   RDW 15.6 (H) 01/03/2024 1645   RDW 14.1 05/25/2021 1444   LYMPHSABS 2.4 01/03/2024 1645   LYMPHSABS 1.6 06/20/2019 1125   MONOABS 0.7 01/03/2024 1645   EOSABS 0.2 01/03/2024 1645   EOSABS 0.3 06/20/2019 1125   BASOSABS 0.0 01/03/2024 1645   BASOSABS 0.0 06/20/2019 1125     Parts of this note may have been dictated using voice recognition software. There may be variances in spelling and vocabulary which are unintentional. Not all errors are proofread. Please notify the Thereasa Parkin if any discrepancies are noted or if the meaning of any statement is not clear.

## 2024-02-23 ENCOUNTER — Encounter (HOSPITAL_COMMUNITY): Payer: Self-pay

## 2024-02-26 ENCOUNTER — Telehealth (HOSPITAL_COMMUNITY): Payer: Self-pay

## 2024-02-26 ENCOUNTER — Telehealth (HOSPITAL_COMMUNITY): Payer: Self-pay | Admitting: Adult Health

## 2024-02-26 DIAGNOSIS — N1831 Chronic kidney disease, stage 3a: Secondary | ICD-10-CM

## 2024-02-26 DIAGNOSIS — I5022 Chronic systolic (congestive) heart failure: Secondary | ICD-10-CM

## 2024-02-26 MED ORDER — TORSEMIDE 100 MG PO TABS: 100.0000 mg | ORAL_TABLET | Freq: Two times a day (BID) | ORAL | 3 refills | Status: DC

## 2024-02-26 NOTE — Telephone Encounter (Signed)
  Cardiomems Remote Monitoring  S/P Cardiomems Implant   PAD Goal: 20 Most recent reading: 26 Suggestive of fluid accumulation.   Recommended changes: Please call increase torsemide to 100 mg twice a day.   I continue to review and analyze the patients PA pressures weekly (and more often as needed) to bring PA pressures within the optimal range.    Reda Gettis NP-C  1:41 PM

## 2024-02-26 NOTE — Addendum Note (Signed)
 Addended by: Tyra Galley B on: 02/26/2024 04:19 PM   Modules accepted: Orders

## 2024-02-26 NOTE — Progress Notes (Signed)
 Patient ID: [name], female   DOB: 1961-05-02, 63 y.o.   MRN: [id number]    Advanced Heart Failure Clinic Note   PCP: [facility] PV: Dr. Katheryne Pane HF Cardiology: Dr. Mitzie Anda  Reason for Visit: Heart Failure Follow-up HPI: [name] is a 63 y.o. with history of PAD, carotid stenosis s/p left CEA, CAD, chronic systolic CHF, paroxysmal atrial fibrillation, bradycardia, and prior substance abuse.   In 5/18, she had peripheral angiography with atherectomy of right SFA.  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 In 8/20, she had peripheral angiography showing totally occluded left SFA.  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 Shirley Douglas.    Echo 1/21 EF 50%, mild LVH, normal RV, PASP 60 mmHg.  Echo 4/22 EF 55-60% with basal to mid inferolateral hypokinesis.   Underwent PV angiogram with Dr. Katheryne Pane 7/22. She had drug-coated balloon angioplasty of the right SFA after being admitted for critical ischemia of the right foot.  Repeat peripheral arterial dopplers in 8/22 still showed severe right SFA disease.   Echo 4/24 EF 50-55%, RV normal.  Admitted 9/24 with UTI/sepsis. Required ICU care, BiPap and abx. Underwent RLE angiogram with femoropopliteal angioplasty and stenting.  Two admissions in 11/24 for AF/RVR s/p DCCV. EF was 45-50%, nl RV. She was d/c'd on Torsemide 100 bid w/o weekly metolazone and readmitted for volume overload. She was d/c'd with prn Metolazone, however CardioMems reading (PA 31) suggested she was not diuresed at discharge. Was instructed to use Furosicx at home. At follow up PA 36, she was switched back to Torsemide with metolazone.  She returns today for heart failure follow up. Overall feeling okay. NYHA IIIb, compounded by knee pain, claudication, and body habitus. Reports fatigue. Denies chest pain, dyspnea, near-syncope, orthopnea, palpitations, dizziness, and abnormal bleeding. Would like a  rollator to assist with ambulation. Wearing compression stocking. Smoking 0.5 pack/day. Started Mounjaro, has not had weight loss yet. Compliant with all medications. Follows with Paramedicine. Compliant with CardioMems.   SH: Lives with sister.  Prior cocaine abuse.  Prior ETOH abuse.  Quit smoking in 1/17, restarted, and quit again in 4/19, restarted again.  FH: CAD  Review of systems complete and found to be negative unless listed in HPI.    Current Outpatient Medications  Medication Sig Dispense Refill   ACCU-CHEK GUIDE test strip USE TO CHECK BLOOD SUGAR FOUR TIMES DAILY     acetaminophen (TYLENOL) 500 MG tablet Take 1,000 mg by mouth every 6 (six) hours as needed for headache (pain).     albuterol (VENTOLIN HFA) 108 (90 Base) MCG/ACT inhaler USE 2 PUFFS BY MOUTH EVERY FOUR HOURS, AS NEEDED, FOR COUGHING/WHEEZING 8.5 g 0   allopurinol (ZYLOPRIM) 100 MG tablet Take 150 mg by mouth daily.     apixaban (ELIQUIS) 5 MG TABS tablet Take 1 tablet (5 mg total) by mouth 2 (two) times daily. 60 tablet 6   budesonide-formoterol (SYMBICORT) 160-4.5 MCG/ACT inhaler Inhale 2 puffs into the lungs 2 (two) times daily. 1 each 2   buPROPion ER (WELLBUTRIN SR) 100 MG 12 hr tablet Take 1 tablet (100 mg total) by mouth daily. 30 tablet 6   Cholecalciferol (VITAMIN D3 PO) Take 1 capsule by mouth every morning.     colchicine 0.6 MG tablet Take 1.2 mg by mouth See admin instructions. Take 1.2 mg for flair up and an hour later take 0.6 mg if needed  Continuous Glucose Receiver (FREESTYLE LIBRE 3 READER) DEVI 1 Device by Does not apply route continuous. 1 each 0   Continuous Glucose Sensor (FREESTYLE LIBRE 3 PLUS SENSOR) MISC Change sensor every 15 days. 6 each 3   EASY COMFORT PEN NEEDLES 31G X 5 MM MISC USE FOUR TIMES A DAY FOR INSULIN ADMINISTRATION 100 each 1   fluticasone (FLONASE) 50 MCG/ACT nasal spray Place 1 spray into both nostrils as needed for allergies or rhinitis.     Furosemide (FUROSCIX [place])  Inject into the skin as directed. As needed     gabapentin (NEURONTIN) 100 MG capsule Take 1 capsule (100 mg total) by mouth 3 (three) times daily. (Patient taking differently: Take 100 mg by mouth 3 (three) times daily. Patient takes 1 tablet by mouth at bedtime) 270 capsule 1   hydrALAZINE (APRESOLINE) 25 MG tablet Take 1 tablet (25 mg total) by mouth 3 (three) times daily. 270 tablet 3   insulin lispro (HUMALOG) 100 UNIT/ML KwikPen Inject 15 Units into the skin 3 (three) times daily with meals. Plus sliding scale, max dose 70 units /day (Patient taking differently: Inject 18 Units into the skin 3 (three) times daily with meals. Plus sliding scale, max dose 70 units /day) 25 mL 1   LANTUS SOLOSTAR 100 UNIT/ML Solostar Pen Inject 37 Units into the skin daily. (Patient taking differently: Inject 50 Units into the skin daily.) 15 mL 0   levothyroxine (SYNTHROID) 50 MCG tablet TAKE 1 TABLET (50 MCG TOTAL) BY MOUTH DAILY BEFORE BREAKFAST (AM) 30 tablet 0   metoprolol succinate (TOPROL-XL) 25 MG 24 hr tablet Take 1 tablet (25 mg total) by mouth daily. 90 tablet 3   Omega-3 Fatty Acids (FISH OIL PO) Take 1 tablet by mouth daily.     pantoprazole (PROTONIX) 40 MG tablet TAKE 1 TABLET (40 MG TOTAL) BY MOUTH DAILY(AM) 30 tablet 0   polyethylene glycol (MIRALAX / GLYCOLAX) 17 g packet Take 17 g by mouth daily as needed for moderate constipation. 14 each 0   rosuvastatin (CRESTOR) 40 MG tablet TAKE 1 TABLET (40 MG TOTAL) BY MOUTH DAILY (Patient taking differently: Take 40 mg by mouth at bedtime.) 15 tablet 0   tirzepatide (MOUNJARO) 5 MG/0.5ML Pen Inject 5 mg into the skin once a week. (Patient taking differently: Inject 5 mg into the skin once a week. Takes on Fridays) 2 mL 0   torsemide (DEMADEX) 100 MG tablet Take 1 tablet (100 mg total) by mouth 2 (two) times daily. 60 tablet 3   triamcinolone ointment (KENALOG) 0.1 % Apply topically 2 (two) times daily. 454 g 0   VITAMIN E PO Take 1 tablet by mouth daily.      No current facility-administered medications for this encounter.   BP (!) 114/56   Pulse (!) 59   Wt 98.2 kg (216 lb 9.6 oz)   LMP  (LMP Unknown)   SpO2 99%   BMI 38.37 kg/m   Wt Readings from Last 3 Encounters:  02/29/24 98.2 kg (216 lb 9.6 oz)  02/21/24 97.1 kg (214 lb)  02/08/24 97.3 kg (214 lb 9.6 oz)   PHYSICAL EXAM: General: Deconditioned appearing. No distress on RA. Arrived by [facility]. Cardiac: JVP flat. S1 and S2 present. No murmurs or rub. Abdomen: Obese, non-tender, non-distended.  Extremities: Warm and dry.  Trace BLE edema. + compression socks Neuro: Alert and oriented x3. Affect pleasant. Generalized weakness  CardioMEMs (personally reviewed): 23   Assessment/Plan: 1. Chronic HF with mid range EF:  LHC in 12/15 showing only 80% stenosis in small OM1. EF as low as 40-45% in the past. Echo 4/22 showed EF 55-60% with grade 2 diastolic dysfunction.  Echo (4/24) EF 50-55%.  Echo in 11/24 with EF 45-50%, normal RV, mild MR, anterolateral akinesis.   - NYHA class III (chronic), worsened by body habitus and knee pain.  - Volume ok by exam. Cardiomems right around goal 18-22 over past week.  - Continue torsemide 100 mg bid.  - GDMT now limited by AKI - Stop Spiro and Losartan. Start Hydral 25 mg tid. BMET/BNP today, repeat in 2wks - Off Jardiance due to severe UTIs and uncontrolled A1C. - Continue Toprol XL 25 mg daily.   2. CKD IV:  Prev baseline ~1.5. Has been >2 since January. - elevated up to 2.8 over the last 2 months. Last Cr 2.49. - in the setting of cardio-renal disease and poorly controlled T2DM. A1C 12.6. - stop losartan and spiro as above.  - Referral to [facility]; attempted to refer at last appointment.  - BMET today  3. Atrial fibrillation: Paroxysmal. SR on exam - Continue Eliquis 5 mg bid. No abnormal bleeding  4. PAD: Claudication bilaterally. Not candidate for cilostazol with CHF.  She has had a left fem-pop bypass in 8/20. She underwent PV  angiogram with balloon angioplasty of the R SFA 7/22. 8/22 dopplers still showed severe right SFA disease. Dopplers 2/23 showed patent right SFA.  Peripheral arterial dopplers in 2/23 showed 50-74% stenosis right SFA and popliteal.  S/p RLE fem-pop angioplasty and stenting in 9/24. She still has bilateral foot ulcers.  - [facility], goes every 3 weeks. - Continue rosuvastatin.  - She is on apixaban so no ASA.   5. COPD/OSA:  Smoking 0.5pack/day. Continue bupropion. Attempting to quit.  - Discussed smoking cessation.    - Not able to tolerate CPAP.    6. HTN:  - Start Hydral as above. Will follow BP with Paramedicine.  7.Type 2 diabetes: Poorly controlled. A1C 12.6. On insulin.  - Now follows with Endocrine. - Reports BG has been improving, continues to work at control. - Needs opthomologist for eye exam  8. CAD/HLD: LHC in 12/15 with 80% stenosis in small OM1, nonobstructive disease in other territories. No chest pain. - Continue rosuvastatin + Vascepa - Not on ASA with need for Eliquis.  9. Carotid stenosis: Stable 4/24 dopplers.  10. Obesity: Body mass index is 38.37 kg/m. - On Mounjaro - has not had weight loss yet  She would like referral to Opthomologist and a rollator for ambulation assistance. Direct message sent to PCP.   Follow up with APP in 4 weeks  [place] Alese Furniss, NP 02/29/2024

## 2024-02-26 NOTE — Telephone Encounter (Signed)
-----   Message from [place] Lee sent at 02/22/2024  6:50 AM EDT ----- Suzzette Eth to stop spirolactone, due to elevated creatinine.

## 2024-02-26 NOTE — Telephone Encounter (Addendum)
 Called and made patient aware. Will also forward to Paramedicine to see if additional torsemide can be added to patients medication.   Did have to change from 40mg  tablet to the 100mg  tablet.   Advised patient to call back to office with any issues, questions, or concerns. Patient verbalized understanding.

## 2024-02-26 NOTE — Telephone Encounter (Signed)
 Spoke to [employer] with [employer] to discontinue pt's Spiro medication from pill pack. However, pharmacy has all ready made pt's pill pack this morning and will call pt directly to discuss which pill is the Spiro so pt can take them out of her pill pack.  Pt aware, agreeable, and verbalized understanding.

## 2024-02-27 ENCOUNTER — Other Ambulatory Visit (HOSPITAL_COMMUNITY): Payer: Self-pay

## 2024-02-27 NOTE — Progress Notes (Signed)
 Paramedicine Encounter    Patient ID: [name], female    DOB: [birth year], 62 y.o.   MRN: [id number]    Home visit today due to abnormal labs and cardiomems readings elevated and to accommodate med changes.   Stop spiro and increase torsemide to 100mg  BID.  Those changes were made for her. Replaced her current bubble packs with new ones with the med changes for 2 more wks.  Pharmacy has instructions so next packs should be updated.   Weight today - 215 LMP  (LMP Unknown)  Weight yesterday--217  Last visit weight-216   Patient Care Team: Hillery Aldo, NP as PCP - General Allyson Sabal Delton See, MD as PCP - Cardiology (Cardiology) Laurey Morale, MD as PCP - Advanced Heart Failure (Cardiology) Regan Lemming, MD as PCP - Electrophysiology (Cardiology) Runell Gess, MD as Consulting Physician (Cardiology) Nyoka Cowden, MD as Consulting Physician (Pulmonary Disease) System, Provider Not In  Patient Active Problem List   Diagnosis Date Noted   Heart failure ([name]) 10/11/2023   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) and diastolic (congestive) heart failure ([name]) 10/10/2023   Pre-ulcerative calluses 04/25/2022   COPD (chronic obstructive pulmonary disease) ([name]) 05/05/2021   OSA (obstructive sleep apnea) 05/05/2021   Stage 3b chronic kidney disease ([name]) 05/05/2021   Pressure injury of skin 05/04/2021   Diabetic nephropathy ([name]) 01/02/2021   Low back pain 06/01/2020   Hyperlipidemia 04/03/2020   PAD (peripheral artery disease) ([name]) 12/26/2019   NICM (nonischemic cardiomyopathy) ([name]) 06/20/2019   Non-healing ulcer ([name]) 06/20/2019   Coagulation disorder ([name]) 08/09/2017   Depression 07/21/2017   At risk for adverse drug reaction 06/20/2017   Peripheral neuropathy 06/20/2017   S/P transmetatarsal amputation of foot, right ([name]) 06/05/2017   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) 05/19/2017    Obesity, class 2 02/24/2016   Encounter for therapeutic drug monitoring 02/10/2016   Symptomatic bradycardia 01/12/2016   Essential hypertension 12/22/2015   Elevated troponin    Acute on chronic diastolic CHF (congestive heart failure) ([name])    Anemia of chronic disease 11/08/2015   Hypokalemia 11/08/2015   Tobacco abuse 10/23/2015   Coronary artery disease    DOE (dyspnea on exertion) 04/29/2015   PAF (paroxysmal atrial fibrillation) ([name]) 01/16/2015   Carotid arterial disease ([name]) 01/16/2015   Insomnia 02/03/2014   S/P peripheral artery angioplasty - TurboHawk atherectomy; R SFA 09/11/2013    Class: Acute   Leg pain, bilateral 08/19/2013   Hypothyroidism 07/31/2013   History of cocaine abuse ([name]) 06/13/2013   Long term current use of anticoagulant therapy 05/20/2013   Alcohol abuse    Narcotic abuse ([name])    Marijuana abuse    Alcoholic cirrhosis ([name])    Type 2 diabetes mellitus with hyperlipidemia ([name])     Current Outpatient Medications:    ACCU-CHEK GUIDE test strip, USE TO CHECK BLOOD SUGAR FOUR TIMES DAILY, Disp: , Rfl:    acetaminophen (TYLENOL) 500 MG tablet, Take 1,000 mg by mouth every 6 (six) hours as needed for headache (pain)., Disp: , Rfl:    albuterol (VENTOLIN HFA) 108 (90 Base) MCG/ACT inhaler, USE 2 PUFFS BY MOUTH EVERY FOUR HOURS, AS NEEDED, FOR COUGHING/WHEEZING, Disp: 8.5 g, Rfl: 0   allopurinol (ZYLOPRIM) 100 MG tablet, Take 150 mg by mouth daily., Disp: , Rfl:    apixaban (ELIQUIS) 5 MG TABS tablet, Take 1 tablet (5 mg total) by mouth 2 (two) times daily., Disp:  60 tablet, Rfl: 6   budesonide-formoterol (SYMBICORT) 160-4.5 MCG/ACT inhaler, Inhale 2 puffs into the lungs 2 (two) times daily., Disp: 1 each, Rfl: 2   buPROPion ER (WELLBUTRIN SR) 100 MG 12 hr tablet, Take 1 tablet (100 mg total) by mouth daily., Disp: 30 tablet, Rfl: 6   Cholecalciferol (VITAMIN D3 PO), Take 1 capsule by mouth every morning., Disp: , Rfl:    colchicine 0.6 MG tablet, Take  1.2 mg by mouth See admin instructions. Take 1.2 mg for flair up and an hour later take 0.6 mg if needed, Disp: , Rfl:    Continuous Glucose Receiver (FREESTYLE LIBRE 3 READER) DEVI, 1 Device by Does not apply route continuous., Disp: 1 each, Rfl: 0   Continuous Glucose Sensor (FREESTYLE LIBRE 3 PLUS SENSOR) MISC, Change sensor every 15 days., Disp: 6 each, Rfl: 3   EASY COMFORT PEN NEEDLES 31G X 5 MM MISC, USE FOUR TIMES A DAY FOR INSULIN ADMINISTRATION, Disp: 100 each, Rfl: 1   fluticasone (FLONASE) 50 MCG/ACT nasal spray, Place 1 spray into both nostrils as needed for allergies or rhinitis., Disp: , Rfl:    Furosemide (FUROSCIX [place]), Inject into the skin as directed. As needed, Disp: , Rfl:    gabapentin (NEURONTIN) 100 MG capsule, Take 1 capsule (100 mg total) by mouth 3 (three) times daily., Disp: 270 capsule, Rfl: 1   insulin lispro (HUMALOG) 100 UNIT/ML KwikPen, Inject 15 Units into the skin 3 (three) times daily with meals. Plus sliding scale, max dose 70 units /day, Disp: 25 mL, Rfl: 1   LANTUS SOLOSTAR 100 UNIT/ML Solostar Pen, Inject 37 Units into the skin daily. (Patient taking differently: Inject 50 Units into the skin daily.), Disp: 15 mL, Rfl: 0   levothyroxine (SYNTHROID) 50 MCG tablet, TAKE 1 TABLET (50 MCG TOTAL) BY MOUTH DAILY BEFORE BREAKFAST (AM), Disp: 30 tablet, Rfl: 0   losartan (COZAAR) 25 MG tablet, Take 1 tablet (25 mg total) by mouth daily., Disp: 30 tablet, Rfl: 11   metoprolol succinate (TOPROL-XL) 25 MG 24 hr tablet, Take 1 tablet (25 mg total) by mouth daily., Disp: 90 tablet, Rfl: 3   Omega-3 Fatty Acids (FISH OIL PO), Take 1 tablet by mouth daily., Disp: , Rfl:    pantoprazole (PROTONIX) 40 MG tablet, TAKE 1 TABLET (40 MG TOTAL) BY MOUTH DAILY(AM), Disp: 30 tablet, Rfl: 0   polyethylene glycol (MIRALAX / GLYCOLAX) 17 g packet, Take 17 g by mouth daily as needed for moderate constipation., Disp: 14 each, Rfl: 0   rosuvastatin (CRESTOR) 40 MG tablet, TAKE 1 TABLET (40 MG  TOTAL) BY MOUTH DAILY (Patient taking differently: Take 40 mg by mouth at bedtime.), Disp: 15 tablet, Rfl: 0   tirzepatide (MOUNJARO) 5 MG/0.5ML Pen, Inject 5 mg into the skin once a week., Disp: 2 mL, Rfl: 0   torsemide (DEMADEX) 100 MG tablet, Take 1 tablet (100 mg total) by mouth 2 (two) times daily., Disp: 60 tablet, Rfl: 3   triamcinolone ointment (KENALOG) 0.1 %, Apply topically 2 (two) times daily., Disp: 454 g, Rfl: 0   VITAMIN E PO, Take 1 tablet by mouth daily., Disp: , Rfl:  Allergies  Allergen Reactions   Gabapentin Nausea And Vomiting and Other (See Comments)    POSSIBLE SHAKING   Lyrica [Pregabalin] Other (See Comments)    Shaking       Social History   Socioeconomic History   Marital status: Single    Spouse name: Not on file   Number  of children: 1   Years of education: 12   Highest education level: 12th grade  Occupational History   Occupation: disabled  Tobacco Use   Smoking status: Every Day    Current packs/day: 1.00    Average packs/day: 1 pack/day for 44.0 years (44.0 ttl pk-yrs)    Types: E-cigarettes, Cigarettes   Smokeless tobacco: Former    Types: Snuff  Vaping Use   Vaping status: Former   Devices: 11/26/2018 "stopped months ago"  Substance and Sexual Activity   Alcohol use: Not Currently    Comment: occ   Drug use: Not Currently    Types: "Crack" cocaine, Marijuana, Oxycodone   Sexual activity: Not Currently  Other Topics Concern   Not on file  Social History Narrative   ** Merged History Encounter **       Lives in [place], in motel with sister.  They are looking to move but don't have a place to go yet.     Social Drivers of Corporate investment banker Strain: Low Risk  (02/09/2023)   Overall Financial Resource Strain (CARDIA)    Difficulty of Paying Living Expenses: Not hard at all  Food Insecurity: No Food Insecurity (10/10/2023)   Hunger Vital Sign    Worried About Running Out of Food in the Last Year: Never true    Ran Out of Food  in the Last Year: Never true  Transportation Needs: No Transportation Needs (10/10/2023)   PRAPARE - Administrator, Civil Service (Medical): No    Lack of Transportation (Non-Medical): No  Physical Activity: Inactive (02/09/2023)   Exercise Vital Sign    Days of Exercise per Week: 0 days    Minutes of Exercise per Session: 0 min  Stress: No Stress Concern Present (02/09/2023)   [employer] of Occupational Health - Occupational Stress Questionnaire    Feeling of Stress : Not at all  Social Connections: Unknown (08/21/2023)   Received from [facility]   Social Network    Social Network: Not on file  Intimate Partner Violence: Not At Risk (10/10/2023)   Humiliation, Afraid, Rape, and Kick questionnaire    Fear of Current or Ex-Partner: No    Emotionally Abused: No    Physically Abused: No    Sexually Abused: No    Physical Exam      Future Appointments  Date Time Provider Department Center  02/29/2024  8:00 AM [facility] ENDO/NEURO LAB [facility] None  02/29/2024 11:00 AM [facility] PA/NP [facility] None  03/05/2024  1:00 PM Jorge Newcomer, MD [facility] None  03/19/2024  3:15 PM Iver Marker, RD [facility] NDM  04/09/2024  3:00 PM [facility] HS VASC 4 [facility] VVS  04/09/2024  4:00 PM Carlene Che, MD [facility] VVS       Alfonza Angry, Paramedic [phone] [facility] Paramedic  02/27/24

## 2024-02-28 ENCOUNTER — Telehealth (HOSPITAL_COMMUNITY): Payer: Self-pay

## 2024-02-28 NOTE — Telephone Encounter (Signed)
 Called to confirm/remind patient of their appointment at the Advanced [facility] Failure Clinic on 01/29/24.   Appointment:   [x] Confirmed  [] Left mess   [] No answer/No voice mail  [] Phone not in service  Patient reminded to bring all medications and/or complete list.  Confirmed patient has transportation. Gave directions, instructed to utilize valet parking.

## 2024-02-29 ENCOUNTER — Other Ambulatory Visit

## 2024-02-29 ENCOUNTER — Encounter (HOSPITAL_COMMUNITY): Payer: Self-pay

## 2024-02-29 ENCOUNTER — Ambulatory Visit (HOSPITAL_COMMUNITY)
Admission: RE | Admit: 2024-02-29 | Discharge: 2024-02-29 | Disposition: A | Discharge status: Home / Self Care | Source: Ambulatory Visit | Attending: Cardiology | Admitting: Cardiology

## 2024-02-29 ENCOUNTER — Telehealth (HOSPITAL_COMMUNITY): Payer: Self-pay

## 2024-02-29 VITALS — BP 114/56 | HR 59 | Wt 216.6 lb

## 2024-02-29 DIAGNOSIS — I130 Hypertensive heart and chronic kidney disease with heart failure and stage 1 through stage 4 chronic kidney disease, or unspecified chronic kidney disease: Secondary | ICD-10-CM | POA: Diagnosis present

## 2024-02-29 DIAGNOSIS — E669 Obesity, unspecified: Secondary | ICD-10-CM | POA: Insufficient documentation

## 2024-02-29 DIAGNOSIS — E1122 Type 2 diabetes mellitus with diabetic chronic kidney disease: Secondary | ICD-10-CM

## 2024-02-29 DIAGNOSIS — Z7901 Long term (current) use of anticoagulants: Secondary | ICD-10-CM | POA: Insufficient documentation

## 2024-02-29 DIAGNOSIS — I480 Paroxysmal atrial fibrillation: Secondary | ICD-10-CM | POA: Diagnosis not present

## 2024-02-29 DIAGNOSIS — I6529 Occlusion and stenosis of unspecified carotid artery: Secondary | ICD-10-CM | POA: Insufficient documentation

## 2024-02-29 DIAGNOSIS — N179 Acute kidney failure, unspecified: Secondary | ICD-10-CM | POA: Insufficient documentation

## 2024-02-29 DIAGNOSIS — E785 Hyperlipidemia, unspecified: Secondary | ICD-10-CM | POA: Diagnosis not present

## 2024-02-29 DIAGNOSIS — L97519 Non-pressure chronic ulcer of other part of right foot with unspecified severity: Secondary | ICD-10-CM | POA: Insufficient documentation

## 2024-02-29 DIAGNOSIS — Z7985 Long-term (current) use of injectable non-insulin antidiabetic drugs: Secondary | ICD-10-CM | POA: Diagnosis not present

## 2024-02-29 DIAGNOSIS — Z794 Long term (current) use of insulin: Secondary | ICD-10-CM | POA: Insufficient documentation

## 2024-02-29 DIAGNOSIS — Z79899 Other long term (current) drug therapy: Secondary | ICD-10-CM | POA: Insufficient documentation

## 2024-02-29 DIAGNOSIS — F17210 Nicotine dependence, cigarettes, uncomplicated: Secondary | ICD-10-CM | POA: Insufficient documentation

## 2024-02-29 DIAGNOSIS — I2510 Atherosclerotic heart disease of native coronary artery without angina pectoris: Secondary | ICD-10-CM

## 2024-02-29 DIAGNOSIS — E1165 Type 2 diabetes mellitus with hyperglycemia: Secondary | ICD-10-CM | POA: Diagnosis not present

## 2024-02-29 DIAGNOSIS — E1151 Type 2 diabetes mellitus with diabetic peripheral angiopathy without gangrene: Secondary | ICD-10-CM | POA: Insufficient documentation

## 2024-02-29 DIAGNOSIS — E11621 Type 2 diabetes mellitus with foot ulcer: Secondary | ICD-10-CM | POA: Diagnosis not present

## 2024-02-29 DIAGNOSIS — I739 Peripheral vascular disease, unspecified: Secondary | ICD-10-CM

## 2024-02-29 DIAGNOSIS — Z6838 Body mass index (BMI) 38.0-38.9, adult: Secondary | ICD-10-CM | POA: Diagnosis not present

## 2024-02-29 DIAGNOSIS — N184 Chronic kidney disease, stage 4 (severe): Secondary | ICD-10-CM | POA: Diagnosis not present

## 2024-02-29 DIAGNOSIS — Z955 Presence of coronary angioplasty implant and graft: Secondary | ICD-10-CM | POA: Diagnosis not present

## 2024-02-29 DIAGNOSIS — G4733 Obstructive sleep apnea (adult) (pediatric): Secondary | ICD-10-CM | POA: Diagnosis not present

## 2024-02-29 DIAGNOSIS — I5022 Chronic systolic (congestive) heart failure: Secondary | ICD-10-CM

## 2024-02-29 DIAGNOSIS — I10 Essential (primary) hypertension: Secondary | ICD-10-CM

## 2024-02-29 DIAGNOSIS — J449 Chronic obstructive pulmonary disease, unspecified: Secondary | ICD-10-CM | POA: Diagnosis not present

## 2024-02-29 LAB — BASIC METABOLIC PANEL WITH GFR
Anion gap: 13 (ref 5–15)
BUN: 99 mg/dL — ABNORMAL HIGH (ref 8–23)
CO2: 23 mmol/L (ref 22–32)
Calcium: 8.7 mg/dL — ABNORMAL LOW (ref 8.9–10.3)
Chloride: 102 mmol/L (ref 98–111)
Creatinine, Ser: 2.58 mg/dL — ABNORMAL HIGH (ref 0.44–1.00)
GFR, Estimated: 21 mL/min — ABNORMAL LOW (ref >60)
Glucose, Bld: 259 mg/dL — ABNORMAL HIGH (ref 70–99)
Potassium: 4.3 mmol/L (ref 3.5–5.1)
Sodium: 138 mmol/L (ref 135–145)

## 2024-02-29 LAB — LIPID PANEL
Cholesterol: 163 mg/dL (ref <200)
HDL: 37 mg/dL — ABNORMAL LOW (ref >=50)
LDL Cholesterol (Calc): 79 mg/dL
Non-HDL Cholesterol (Calc): 126 mg/dL (ref <130)
Total CHOL/HDL Ratio: 4.4 (calc) (ref <5.0)
Triglycerides: 386 mg/dL — ABNORMAL HIGH (ref <150)

## 2024-02-29 LAB — BRAIN NATRIURETIC PEPTIDE: B Natriuretic Peptide: 303.1 pg/mL — ABNORMAL HIGH (ref 0.0–100.0)

## 2024-02-29 MED ORDER — HYDRALAZINE HCL 25 MG PO TABS: 25.0000 mg | ORAL_TABLET | Freq: Three times a day (TID) | ORAL | 3 refills | Status: DC

## 2024-02-29 NOTE — Progress Notes (Signed)
 ReDS Vest / Clip - 02/29/24 1100       ReDS Vest / Clip   Station Marker A    Ruler Value 31    ReDS Value Range Low volume    ReDS Actual Value 33

## 2024-02-29 NOTE — Patient Instructions (Addendum)
 STOP Losartan  START Hydralazine 25 mg Three times a day  Labs done today, your results will be available in MyChart, we will contact you for abnormal readings.  Repeat blood work in 2 weeks.  You have been referred to [facility]. They will call you to arrange your appointment.  PLEASE ANSWER YOUR PHONE AS IT MAYBE THE CLINIC OR [facility] CALLING YOU.  Your physician recommends that you schedule a follow-up appointment in: 4 weeks.  If you have any questions or concerns before your next appointment please send us  a message through [facility] or call our office at [phone].    TO LEAVE A MESSAGE FOR THE NURSE SELECT OPTION 2, PLEASE LEAVE A MESSAGE INCLUDING: YOUR NAME DATE OF BIRTH CALL BACK NUMBER REASON FOR CALL**this is important as we prioritize the call backs  YOU WILL RECEIVE A CALL BACK THE SAME DAY AS LONG AS YOU CALL BEFORE 4:00 PM  At the [facility] Clinic, you and your health needs are our priority. As part of our continuing mission to provide you with exceptional heart care, we have created designated Provider Care Teams. These Care Teams include your primary Cardiologist (physician) and Advanced Practice Providers (APPs- Physician Assistants and Nurse Practitioners) who all work together to provide you with the care you need, when you need it.   You may see any of the following providers on your designated Care Team at your next follow up: Dr Jules Oar Dr Peder Bourdon Dr. Alwin Baars Dr. Arta Lark [name] Marijane Shoulders, NP Ruddy Corral, [place] [facility] [place], [place] Dennise Fitz, NP [place] Lee, NP Shawnee Dellen, NP Luster Salters, PharmD Bevely Brush, PharmD   Please be sure to bring in all your medications bottles to every appointment.    Thank you for choosing [facility]

## 2024-02-29 NOTE — Telephone Encounter (Signed)
Referral sent to [facility]

## 2024-03-05 ENCOUNTER — Encounter: Payer: Self-pay | Admitting: "Endocrinology

## 2024-03-05 ENCOUNTER — Ambulatory Visit (INDEPENDENT_AMBULATORY_CARE_PROVIDER_SITE_OTHER): Admitting: "Endocrinology

## 2024-03-05 VITALS — BP 118/80 | HR 83 | Ht 63.0 in | Wt 216.0 lb

## 2024-03-05 DIAGNOSIS — Z794 Long term (current) use of insulin: Secondary | ICD-10-CM | POA: Diagnosis not present

## 2024-03-05 DIAGNOSIS — Z7985 Long-term (current) use of injectable non-insulin antidiabetic drugs: Secondary | ICD-10-CM

## 2024-03-05 DIAGNOSIS — E782 Mixed hyperlipidemia: Secondary | ICD-10-CM

## 2024-03-05 DIAGNOSIS — E1165 Type 2 diabetes mellitus with hyperglycemia: Secondary | ICD-10-CM | POA: Diagnosis not present

## 2024-03-05 MED ORDER — TIRZEPATIDE 7.5 MG/0.5ML ~~LOC~~ SOAJ
7.5000 mg | SUBCUTANEOUS | 0 refills | Status: DC
Start: 2024-03-05 — End: 2024-04-10

## 2024-03-05 NOTE — Patient Instructions (Signed)
 Will recommend the following: Lantus  56 units at bedtime Humalog  20 units tid 15 min before meals Mounjaro 5 mg after 4 weeks

## 2024-03-05 NOTE — Progress Notes (Signed)
 Outpatient Endocrinology Note Jorge Newcomer, MD  03/05/24   [name] August 22, 1961 [id number]  Referring Provider: Maryellen Snare, NP Primary Care Provider: Maryellen Snare, NP Reason for consultation: Subjective   Assessment & Plan  Diagnoses and all orders for this visit:  Uncontrolled type 2 diabetes mellitus with hyperglycemia ([name])  Long-term insulin  use ([name])  Long-term (current) use of injectable non-insulin  antidiabetic drugs  Mixed hypercholesterolemia and hypertriglyceridemia  Other orders -     tirzepatide (MOUNJARO) 7.5 MG/0.5ML Pen; Inject 7.5 mg into the skin once a week.   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, No results found for: "GFR" Hba1c goal less than 7, current Hba1c is  Lab Results  Component Value Date   HGBA1C 12.6 (H) 01/23/2024   Will recommend the following: Lantus  56 units at bedtime Humalog  20 units tid 15 min before meals Mounjaro 5 mg after 4 weeks  Has libre 01/25/24 Ordered DM education   No known contraindications/side effects to any of above medications Glucagon discussed and prescribed with refills on 01/25/24   No history of MEN syndrome/medullary thyroid  cancer/pancreatitis or pancreatic cancer in self or family  -Last LD and Tg are as follows: Lab Results  Component Value Date   LDLCALC 79 02/29/2024    Lab Results  Component Value Date   TRIG 386 (H) 02/29/2024   -On rosuvastatin  40 mg every day, takes one fish oil  -Follow low fat diet and exercise   -Blood pressure goal <140/90 - Microalbumin/creatinine goal is < 30 -Last MA/Cr is as follows: Lab Results  Component Value Date   MICROALBUR 9.0 01/23/2024   -on ACE/ARB losartan  25 mg every day  -diet changes including salt restriction -limit eating outside -counseled BP targets per standards of diabetes care -uncontrolled blood pressure can lead to retinopathy, nephropathy and cardiovascular and atherosclerotic heart disease  Reviewed and  counseled on: -A1C target -Blood sugar targets -Complications of uncontrolled diabetes  -Checking blood sugar before meals and bedtime and bring log next visit -All medications with mechanism of action and side effects -Hypoglycemia management: rule of 15's, Glucagon Emergency Kit and medical alert ID -low-carb low-fat plate-method diet -At least 20 minutes of physical activity per day -Annual dilated retinal eye exam and foot exam -compliance and follow up needs -follow up as scheduled or earlier if problem gets worse  Call if blood sugar is less than 70 or consistently above 250    Take a 15 gm snack of carbohydrate at bedtime before you go to sleep if your blood sugar is less than 100.    If you are going to fast after midnight for a test or procedure, ask your physician for instructions on how to reduce/decrease your insulin  dose.    Call if blood sugar is less than 70 or consistently above 250  -Treating a low sugar by rule of 15  (15 gms of sugar every 15 min until sugar is more than 70) If you feel your sugar is low, test your sugar to be sure If your sugar is low (less than 70), then take 15 grams of a fast acting Carbohydrate (3-4 glucose tablets or glucose gel or 4 ounces of juice or regular soda) Recheck your sugar 15 min after treating low to make sure it is more than 70 If sugar is still less than 70, treat again with 15 grams of carbohydrate          Don't drive the hour of hypoglycemia  If unconscious/unable to  eat or drink by mouth, use glucagon injection or nasal spray baqsimi and call 911. Can repeat again in 15 min if still unconscious.  Return in about 4 weeks (around 04/02/2024).   I have reviewed current medications, nurse's notes, allergies, vital signs, past medical and surgical history, family medical history, and social history for this encounter. Counseled patient on symptoms, examination findings, lab findings, imaging results, treatment decisions and  monitoring and prognosis. The patient understood the recommendations and agrees with the treatment plan. All questions regarding treatment plan were fully answered.  Jorge Newcomer, MD  03/05/24    History of Present Illness [name] is a 63 y.o. year old female who presents for follow up for Type II diabetes mellitus.  [name] was first diagnosed around 2014.   Diabetes education +  Home diabetes regimen: Lantus  50 units at bedtime Humalog  18 units tid 20 min before meals  Mounjaro 5 mg/week   COMPLICATIONS -  MI/Stroke +  retinopathy +  neuropathy +  nephropathy  BLOOD SUGAR DATA  CGM interpretation: At today's visit, we reviewed her CGM downloads. The full report is scanned in the media. Reviewing the CGM trends, BG are elevated across the day.  Physical Exam  BP 118/80   Pulse 83   Ht 5\' 3"  (1.6 m)   Wt 216 lb (98 kg)   LMP  (LMP Unknown)   SpO2 98%   BMI 38.26 kg/m    Constitutional: well developed, well nourished Head: normocephalic, atraumatic Eyes: sclera anicteric, no redness Neck: supple Lungs: normal respiratory effort Neurology: alert and oriented Skin: dry, no appreciable rashes Musculoskeletal: no appreciable defects Psychiatric: normal mood and affect Diabetic Foot Exam - Simple   No data filed      Current Medications Patient's Medications  New Prescriptions   TIRZEPATIDE (MOUNJARO) 7.5 MG/0.5ML PEN    Inject 7.5 mg into the skin once a week.  Previous Medications   ACCU-CHEK GUIDE TEST STRIP    USE TO CHECK BLOOD SUGAR FOUR TIMES DAILY   ACETAMINOPHEN  (TYLENOL ) 500 MG TABLET    Take 1,000 mg by mouth every 6 (six) hours as needed for headache (pain).   ALBUTEROL  (VENTOLIN  HFA) 108 (90 BASE) MCG/ACT INHALER    USE 2 PUFFS BY MOUTH EVERY FOUR HOURS, AS NEEDED, FOR COUGHING/WHEEZING   ALLOPURINOL  (ZYLOPRIM ) 100 MG TABLET    Take 150 mg by mouth daily.   APIXABAN  (ELIQUIS ) 5 MG TABS TABLET    Take 1 tablet (5 mg total) by mouth 2  (two) times daily.   BUDESONIDE -FORMOTEROL  (SYMBICORT ) 160-4.5 MCG/ACT INHALER    Inhale 2 puffs into the lungs 2 (two) times daily.   BUPROPION  ER (WELLBUTRIN  SR) 100 MG 12 HR TABLET    Take 1 tablet (100 mg total) by mouth daily.   CHOLECALCIFEROL  (VITAMIN D3 PO)    Take 1 capsule by mouth every morning.   COLCHICINE  0.6 MG TABLET    Take 1.2 mg by mouth See admin instructions. Take 1.2 mg for flair up and an hour later take 0.6 mg if needed   CONTINUOUS GLUCOSE RECEIVER (FREESTYLE LIBRE 3 READER) DEVI    1 Device by Does not apply route continuous.   CONTINUOUS GLUCOSE SENSOR (FREESTYLE LIBRE 3 PLUS SENSOR) MISC    Change sensor every 15 days.   EASY COMFORT PEN NEEDLES 31G X 5 MM MISC    USE FOUR TIMES A DAY FOR INSULIN  ADMINISTRATION   FLUTICASONE  (FLONASE) 50 MCG/ACT NASAL SPRAY  Place 1 spray into both nostrils as needed for allergies or rhinitis.   FUROSEMIDE  (FUROSCIX  [place])    Inject into the skin as directed. As needed   GABAPENTIN  (NEURONTIN ) 100 MG CAPSULE    Take 1 capsule (100 mg total) by mouth 3 (three) times daily.   HYDRALAZINE  (APRESOLINE ) 25 MG TABLET    Take 1 tablet (25 mg total) by mouth 3 (three) times daily.   INSULIN  LISPRO (HUMALOG ) 100 UNIT/ML KWIKPEN    Inject 15 Units into the skin 3 (three) times daily with meals. Plus sliding scale, max dose 70 units /day   LANTUS  SOLOSTAR 100 UNIT/ML SOLOSTAR PEN    Inject 37 Units into the skin daily.   LEVOTHYROXINE  (SYNTHROID ) 50 MCG TABLET    TAKE 1 TABLET (50 MCG TOTAL) BY MOUTH DAILY BEFORE BREAKFAST (AM)   METOPROLOL  SUCCINATE (TOPROL -XL) 25 MG 24 HR TABLET    Take 1 tablet (25 mg total) by mouth daily.   OMEGA-3 FATTY ACIDS (FISH OIL PO)    Take 1 tablet by mouth daily.   PANTOPRAZOLE  (PROTONIX ) 40 MG TABLET    TAKE 1 TABLET (40 MG TOTAL) BY MOUTH DAILY(AM)   POLYETHYLENE GLYCOL (MIRALAX  / GLYCOLAX ) 17 G PACKET    Take 17 g by mouth daily as needed for moderate constipation.   ROSUVASTATIN  (CRESTOR ) 40 MG TABLET    TAKE 1  TABLET (40 MG TOTAL) BY MOUTH DAILY   TORSEMIDE  (DEMADEX ) 100 MG TABLET    Take 1 tablet (100 mg total) by mouth 2 (two) times daily.   TRIAMCINOLONE  OINTMENT (KENALOG ) 0.1 %    Apply topically 2 (two) times daily.   VITAMIN E PO    Take 1 tablet by mouth daily.  Modified Medications   No medications on file  Discontinued Medications   TIRZEPATIDE (MOUNJARO) 5 MG/0.5ML PEN    Inject 5 mg into the skin once a week.    Allergies Allergies  Allergen Reactions   Gabapentin  Nausea And Vomiting and Other (See Comments)    POSSIBLE SHAKING   Lyrica  [Pregabalin ] Other (See Comments)    Shaking     Past Medical History Past Medical History:  Diagnosis Date   Acute renal failure ([name])    Acute respiratory failure with hypoxia ([name]) 12/02/2019   Alcohol abuse    Alcoholic cirrhosis ([name])    Anemia    Anxiety    Arthritis    "knees" (11/26/2018)   B12 deficiency    CAD (coronary artery disease)    a. 11/10/2014 Cath: LM nl, LAD min irregs, D1 30 ost, D2 50d, LCX 34m, OM1 80 p/m (1.5 mm vessel), OM2 50m, RCA nondom 24m-->med rx.. Demand ischemia in the setting of rapid a-fib.   Cardiomyopathy ([name])    Carotid artery disease ([name])    a. 01/2015 Carotid Angio: RICA 100, LICA 95p; b. 02/2015 s/p L CEA; c. 05/2019 Carotid U/S: RICA 100. RECA >50. LICA 1-39%.   Cellulitis    lower extremities   Cellulitis in diabetic foot ([name]) 07/08/2019   CHF (congestive heart failure) ([name])    Chronic combined systolic and diastolic CHF (congestive heart failure) ([name])    a. 10/2014 Echo: EF 40-45%; b. 10/2018 Echo: EF 45-50%, gr2 DD; c. 11/2019 Echo: EF 50%, mild LVH, gr2 DD (restrictive), antlat HK, Nl RV fxn. Mild BAE. RVSP 59.59mmHg.   CKD (chronic kidney disease), stage III ([name])    Cocaine abuse ([name])    COPD (chronic obstructive pulmonary disease) ([name])  Critical ischemia of foot ([name]) 06/07/2021   Critical limb ischemia     Critical lower limb ischemia ([name])    Demand ischemia ([name]) 10/29/2014    Depression    Diabetes mellitus without complication ([name])    Diabetic peripheral neuropathy ([name])    DVT (deep venous thrombosis) ([name])    Dyspnea    Elevated troponin    a. Chronic elevation.   Fall 05/05/2021   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[name])    Peripheral arterial disease/critical limb ischemia     GERD (gastroesophageal reflux disease)    Hyperlipemia    Hypertension    Hypokalemia    Hypomagnesemia    Hypothyroidism    Marijuana abuse    Narcotic abuse ([name])    Noncompliance    NSVT (nonsustained ventricular tachycardia) ([name])    Obesity    Osteomyelitis ([name]) 06/21/2019   PAF (paroxysmal atrial fibrillation) ([name])    Paroxysmal atrial tachycardia ([name])    Peripheral arterial disease ([name])    a. 01/2015 Angio/PTA: RSFA 99 (atherectomy/pta) - 1 vessel runoff via diff dzs peroneal; b. 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   Pneumonia    "once or twice" (11/26/2018)   Poorly controlled type 2 diabetes mellitus ([name])    Renal disorder    Renal insufficiency    a. 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   SIRS (systemic inflammatory response syndrome) ([name]) 04/06/2017   Sleep apnea    "couldn't handle wearing the mask" (11/26/2018)   Symptomatic bradycardia    a. Avoid AV blocking agent per EP. Prev req temp wire in 2017.   Tobacco abuse    Wrist fracture, left, closed, initial encounter 01/29/2015    Past Surgical History Past Surgical History:  Procedure Laterality Date   ABDOMINAL AORTOGRAM N/A 06/26/2019   Procedure: ABDOMINAL AORTOGRAM;  Surgeon: Wenona Hamilton, MD;  Location: [facility];  Service: Cardiovascular;  Laterality: N/A;   ABDOMINAL AORTOGRAM W/LOWER EXTREMITY N/A 06/07/2021   Procedure: ABDOMINAL AORTOGRAM W/LOWER EXTREMITY;  Surgeon: Avanell Leigh, MD;  Location: [facility];  Service: Cardiovascular;  Laterality: N/A;   ABDOMINAL AORTOGRAM W/LOWER EXTREMITY N/A 08/11/2023   Procedure: ABDOMINAL AORTOGRAM W/LOWER  EXTREMITY;  Surgeon: Carlene Che, MD;  Location: [facility];  Service: Cardiovascular;  Laterality: N/A;   AMPUTATION Right 06/14/2017   Procedure: Right foot transmetatarsal amputation;  Surgeon: Timothy Ford, MD;  Location: [facility] OR;  Service: Orthopedics;  Laterality: Right;   AMPUTATION Left 06/28/2019   Procedure: AMPUTATION LEFT FIFTH TOE;  Surgeon: Mayo Speck, MD;  Location: [facility] OR;  Service: Vascular;  Laterality: Left;   AMPUTATION TOE Right 04/28/2017   Procedure: AMPUTATION OF RIGHT SECOND RAY;  Surgeon: Timothy Ford, MD;  Location: [facility] OR;  Service: Orthopedics;  Laterality: Right;   CARDIAC CATHETERIZATION     CARDIAC CATHETERIZATION N/A 01/12/2016   Procedure: Temporary Wire;  Surgeon: Eilleen Grates, MD;  Location: [facility];  Service: Cardiovascular;  Laterality: N/A;   CARDIOVERSION  ~ 02/2013   "twice"    CARDIOVERSION N/A 09/26/2023   Procedure: CARDIOVERSION (CATH LAB);  Surgeon: Bridgette Campus, MD;  Location: [facility] INVASIVE CV LAB;  Service: Cardiovascular;  Laterality: N/A;   CAROTID ANGIOGRAM N/A 01/15/2015   Procedure: CAROTID ANGIOGRAM;  Surgeon: Avanell Leigh, MD;  Location: [facility] CATH LAB;  Service: Cardiovascular;  Laterality: N/A;   DILATION AND CURETTAGE OF UTERUS  1988  ENDARTERECTOMY Left 02/19/2015   Procedure: LEFT CAROTID ENDARTERECTOMY ;  Surgeon: Margherita Shell, MD;  Location: [facility] OR;  Service: Vascular;  Laterality: Left;   FEMORAL-TIBIAL BYPASS GRAFT Left 06/28/2019   Procedure: BYPASS GRAFT LEFT LEG FEMORAL TO ANTERIOR TIBIAL ARTERY using LEFT GREATER SAPHENOUS VEIN;  Surgeon: Mayo Speck, MD;  Location: [facility] OR;  Service: Vascular;  Laterality: Left;   LEFT HEART CATHETERIZATION WITH CORONARY ANGIOGRAM N/A 10/31/2014   Procedure: LEFT HEART CATHETERIZATION WITH CORONARY ANGIOGRAM;  Surgeon: Odie Benne, MD;  Location: [facility] CATH LAB;  Service: Cardiovascular;  Laterality: N/A;   LOWER EXTREMITY ANGIOGRAM N/A 09/10/2013   Procedure:  LOWER EXTREMITY ANGIOGRAM;  Surgeon: Avanell Leigh, MD;  Location: [facility] CATH LAB;  Service: Cardiovascular;  Laterality: N/A;   LOWER EXTREMITY ANGIOGRAM N/A 01/15/2015   Procedure: LOWER EXTREMITY ANGIOGRAM;  Surgeon: Avanell Leigh, MD;  Location: [facility] CATH LAB;  Service: Cardiovascular;  Laterality: N/A;   LOWER EXTREMITY ANGIOGRAPHY N/A 04/13/2017   Procedure: Lower Extremity Angiography;  Surgeon: Avanell Leigh, MD;  Location: [facility] INVASIVE CV LAB;  Service: Cardiovascular;  Laterality: N/A;   LOWER EXTREMITY ANGIOGRAPHY Left 06/26/2019   Procedure: LOWER EXTREMITY ANGIOGRAPHY;  Surgeon: Wenona Hamilton, MD;  [facility];  Service: Cardiovascular;  Laterality: Left;   PERIPHERAL VASCULAR ATHERECTOMY Right 06/07/2021   Procedure: PERIPHERAL VASCULAR ATHERECTOMY;  Surgeon: Avanell Leigh, MD;  Location: [facility] INVASIVE CV LAB;  Service: Cardiovascular;  Laterality: Right;   PERIPHERAL VASCULAR BALLOON ANGIOPLASTY Left 06/26/2019   Procedure: PERIPHERAL VASCULAR BALLOON ANGIOPLASTY;  Surgeon: Wenona Hamilton, MD;  Location: [facility];  Service: Cardiovascular;  Laterality: Left;  unable to cross lt sfa occlusion   PERIPHERAL VASCULAR INTERVENTION  04/13/2017   Procedure: Peripheral Vascular Intervention;  Surgeon: Avanell Leigh, MD;  Location: [facility] INVASIVE CV LAB;  Service: Cardiovascular;;   PERIPHERAL VASCULAR INTERVENTION  08/11/2023   Procedure: PERIPHERAL VASCULAR INTERVENTION;  Surgeon: Carlene Che, MD;  Location: [facility];  Service: Cardiovascular;;   PRESSURE SENSOR/CARDIOMEMS N/A 02/05/2020   Procedure: PRESSURE SENSOR/CARDIOMEMS;  Surgeon: Darlis Eisenmenger, MD;  Location: [facility] INVASIVE CV LAB;  Service: Cardiovascular;  Laterality: N/A;   VEIN HARVEST Left 06/28/2019   Procedure: LEFT LEG GREATER SAPHENOUS VEIN HARVEST;  Surgeon: Mayo Speck, MD;  Location: [facility] OR;  Service: Vascular;  Laterality: Left;    Family History family history includes  Breast cancer in her mother; Cancer in her mother; Clotting disorder in her mother; Diabetes in her mother; Emphysema in her sister; Heart attack in her mother; Heart disease in her father and mother; Hypertension in her father and mother.  Social History Social History   Socioeconomic History   Marital status: Single    Spouse name: Not on file   Number of children: 1   Years of education: 12   Highest education level: 12th grade  Occupational History   Occupation: disabled  Tobacco Use   Smoking status: Every Day    Current packs/day: 1.00    Average packs/day: 1 pack/day for 44.0 years (44.0 ttl pk-yrs)    Types: E-cigarettes, Cigarettes   Smokeless tobacco: Former    Types: Snuff  Vaping Use   Vaping status: Former   Devices: 11/26/2018 "stopped months ago"  Substance and Sexual Activity   Alcohol use: Not Currently    Comment: occ   Drug use: Not Currently    Types: "Crack" cocaine, Marijuana, Oxycodone   Sexual activity: Not Currently  Other Topics Concern   Not on file  Social History Narrative   ** Merged History Encounter **       Lives in [place], in motel with sister.  They are looking to move but don't have a place to go yet.     Social Drivers of Corporate investment banker Strain: Low Risk  (02/09/2023)   Overall Financial Resource Strain (CARDIA)    Difficulty of Paying Living Expenses: Not hard at all  Food Insecurity: No Food Insecurity (10/10/2023)   Hunger Vital Sign    Worried About Running Out of Food in the Last Year: Never true    Ran Out of Food in the Last Year: Never true  Transportation Needs: No Transportation Needs (10/10/2023)   PRAPARE - Administrator, Civil Service (Medical): No    Lack of Transportation (Non-Medical): No  Physical Activity: Inactive (02/09/2023)   Exercise Vital Sign    Days of Exercise per Week: 0 days    Minutes of Exercise per Session: 0 min  Stress: No Stress Concern Present (02/09/2023)   [employer] Occupational [employer] - Occupational Stress Questionnaire    Feeling of Stress : Not at all  Social Connections: Unknown (08/21/2023)   Received from [facility]   Social Network    Social Network: Not on file  Intimate Partner Violence: Not At Risk (10/10/2023)   Humiliation, Afraid, Rape, and Kick questionnaire    Fear of Current or Ex-Partner: No    Emotionally Abused: No    Physically Abused: No    Sexually Abused: No    Lab Results  Component Value Date   HGBA1C 12.6 (H) 01/23/2024   HGBA1C 12.0 (H) 09/25/2023   HGBA1C 11.1 (H) 07/27/2023   Lab Results  Component Value Date   CHOL 163 02/29/2024   Lab Results  Component Value Date   HDL 37 (L) 02/29/2024   Lab Results  Component Value Date   LDLCALC 79 02/29/2024   Lab Results  Component Value Date   TRIG 386 (H) 02/29/2024   Lab Results  Component Value Date   CHOLHDL 4.4 02/29/2024   Lab Results  Component Value Date   CREATININE 2.58 (H) 02/29/2024   No results found for: "GFR" Lab Results  Component Value Date   MICROALBUR 9.0 01/23/2024      Component Value Date/Time   NA 138 02/29/2024 1144   NA 140 09/07/2022 1144   K 4.3 02/29/2024 1144   CL 102 02/29/2024 1144   CO2 23 02/29/2024 1144   GLUCOSE 259 (H) 02/29/2024 1144   BUN 99 (H) 02/29/2024 1144   BUN 26 (H) 09/07/2022 1144   CREATININE 2.58 (H) 02/29/2024 1144   CREATININE 2.01 (H) 01/23/2024 1444   CALCIUM  8.7 (L) 02/29/2024 1144   PROT 6.8 01/23/2024 1444   PROT 6.7 07/07/2022 1155   ALBUMIN  3.0 (L) 01/03/2024 1645   ALBUMIN  4.0 07/07/2022 1155   AST 19 01/23/2024 1444   ALT 23 01/23/2024 1444   ALKPHOS 112 01/03/2024 1645   BILITOT 0.3 01/23/2024 1444   BILITOT <0.2 07/07/2022 1155   GFRNONAA 21 (L) 02/29/2024 1144   GFRAA 39 (L) 06/08/2020 1130      Latest Ref Rng & Units 02/29/2024   11:44 AM 02/21/2024   10:59 AM 02/08/2024   12:15 PM  BMP  Glucose 70 - 99 mg/dL 914  782  956   BUN 8 - 23 mg/dL  99  112  90    Creatinine 0.44 - 1.00 mg/dL 2.95  2.84  1.32   Sodium 135 - 145 mmol/L 138  138  133   Potassium 3.5 - 5.1 mmol/L 4.3  4.5  4.7   Chloride 98 - 111 mmol/L 102  100  93   CO2 22 - 32 mmol/L 23  24  26    Calcium  8.9 - 10.3 mg/dL 8.7  9.2  9.6        Component Value Date/Time   WBC 12.0 (H) 01/03/2024 1645   RBC 3.81 (L) 01/03/2024 1645   HGB 11.6 (L) 01/03/2024 1700   HGB 11.2 (L) 01/03/2024 1700   HGB 10.5 (L) 05/25/2021 1444   HCT 34.0 (L) 01/03/2024 1700   HCT 33.0 (L) 01/03/2024 1700   HCT 35.5 05/25/2021 1444   PLT 236 01/03/2024 1645   PLT 368 05/25/2021 1444   MCV 87.9 01/03/2024 1645   MCV 88 05/25/2021 1444   MCH 28.6 01/03/2024 1645   MCHC 32.5 01/03/2024 1645   RDW 15.6 (H) 01/03/2024 1645   RDW 14.1 05/25/2021 1444   LYMPHSABS 2.4 01/03/2024 1645   LYMPHSABS 1.6 06/20/2019 1125   MONOABS 0.7 01/03/2024 1645   EOSABS 0.2 01/03/2024 1645   EOSABS 0.3 06/20/2019 1125   BASOSABS 0.0 01/03/2024 1645   BASOSABS 0.0 06/20/2019 1125     Parts of this note may have been dictated using voice recognition software. There may be variances in spelling and vocabulary which are unintentional. Not all errors are proofread. Please notify the [facility] if any discrepancies are noted or if the meaning of any statement is not clear.

## 2024-03-06 ENCOUNTER — Encounter: Payer: Self-pay | Admitting: "Endocrinology

## 2024-03-07 ENCOUNTER — Telehealth (HOSPITAL_COMMUNITY): Payer: Self-pay

## 2024-03-07 ENCOUNTER — Other Ambulatory Visit: Payer: Self-pay

## 2024-03-07 ENCOUNTER — Encounter: Payer: Self-pay | Admitting: "Endocrinology

## 2024-03-07 ENCOUNTER — Telehealth: Payer: Self-pay | Admitting: "Endocrinology

## 2024-03-07 DIAGNOSIS — E1165 Type 2 diabetes mellitus with hyperglycemia: Secondary | ICD-10-CM

## 2024-03-07 MED ORDER — INSULIN LISPRO (1 UNIT DIAL) 100 UNIT/ML (KWIKPEN): 20.0000 [IU] | PEN_INJECTOR | Freq: Three times a day (TID) | SUBCUTANEOUS | 1 refills | Status: DC

## 2024-03-07 NOTE — Telephone Encounter (Signed)
 Requested Prescriptions   Pending Prescriptions Disp Refills   insulin  lispro (HUMALOG ) 100 UNIT/ML KwikPen 25 mL 1    Sig: Inject 20 Units into the skin 3 (three) times daily with meals. Plus sliding scale, max dose 70 units /day

## 2024-03-07 NOTE — Telephone Encounter (Signed)
 MEDICATION:  insulin  lispro insulin  lispro (HUMALOG ) 100 UNIT/ML KwikPen  PHARMACY:    [facility] - [place], [place] - [address] (Ph: [phone])    HAS THE PATIENT CONTACTED THEIR PHARMACY?  Yes  IS THIS A 90 DAY SUPPLY : Yes  IS PATIENT OUT OF MEDICATION: Yes  IF NOT; HOW MUCH IS LEFT:   LAST APPOINTMENT DATE: @4 /22/2025  NEXT APPOINTMENT DATE:@5 /28/2025  DO WE HAVE YOUR PERMISSION TO LEAVE A DETAILED MESSAGE?: Yes  OTHER COMMENTS: Patient states that the pharmacy told her that they do not have a prescription for the new Humalog  dosage and she is out of the medication.   **Let patient know to contact pharmacy at the end of the day to make sure medication is ready. **  ** Please notify patient to allow 48-72 hours to process**  **Encourage patient to contact the pharmacy for refills or they can request refills through [facility]**

## 2024-03-07 NOTE — Telephone Encounter (Signed)
 Late entry-  Reached out to pt this week regarding med changes from clinic visit of stopping losartan  and starting hydralazine -  I confirmed that she is taking out the losartan - I told her which one to remove from packs and confirmed that the hydralazine  was delivered and she is taking 1 tab TID and she is.   Will continue to f/u.   Alfonza Angry, EMT-Paramedic  [phone] 03/07/2024

## 2024-03-07 NOTE — Telephone Encounter (Signed)
 Rx humalog  sent to pharmacy.

## 2024-03-08 ENCOUNTER — Emergency Department (HOSPITAL_COMMUNITY)

## 2024-03-08 ENCOUNTER — Telehealth (HOSPITAL_COMMUNITY): Payer: Self-pay | Admitting: Cardiology

## 2024-03-08 ENCOUNTER — Encounter (HOSPITAL_COMMUNITY): Payer: Self-pay | Admitting: Internal Medicine

## 2024-03-08 ENCOUNTER — Other Ambulatory Visit: Payer: Self-pay

## 2024-03-08 ENCOUNTER — Inpatient Hospital Stay (HOSPITAL_COMMUNITY)
Admission: EM | Admit: 2024-03-08 | Discharge: 2024-03-17 | DRG: 377 | Disposition: A | Discharge status: Home / Self Care | Attending: Internal Medicine | Admitting: Internal Medicine

## 2024-03-08 DIAGNOSIS — E1142 Type 2 diabetes mellitus with diabetic polyneuropathy: Secondary | ICD-10-CM | POA: Diagnosis present

## 2024-03-08 DIAGNOSIS — G4733 Obstructive sleep apnea (adult) (pediatric): Secondary | ICD-10-CM | POA: Diagnosis present

## 2024-03-08 DIAGNOSIS — T45515A Adverse effect of anticoagulants, initial encounter: Secondary | ICD-10-CM | POA: Diagnosis present

## 2024-03-08 DIAGNOSIS — Z8679 Personal history of other diseases of the circulatory system: Secondary | ICD-10-CM | POA: Diagnosis not present

## 2024-03-08 DIAGNOSIS — F17290 Nicotine dependence, other tobacco product, uncomplicated: Secondary | ICD-10-CM | POA: Diagnosis present

## 2024-03-08 DIAGNOSIS — J410 Simple chronic bronchitis: Secondary | ICD-10-CM

## 2024-03-08 DIAGNOSIS — F411 Generalized anxiety disorder: Secondary | ICD-10-CM | POA: Insufficient documentation

## 2024-03-08 DIAGNOSIS — D62 Acute posthemorrhagic anemia: Secondary | ICD-10-CM | POA: Diagnosis present

## 2024-03-08 DIAGNOSIS — M109 Gout, unspecified: Secondary | ICD-10-CM | POA: Diagnosis present

## 2024-03-08 DIAGNOSIS — I739 Peripheral vascular disease, unspecified: Secondary | ICD-10-CM | POA: Diagnosis not present

## 2024-03-08 DIAGNOSIS — Z9862 Peripheral vascular angioplasty status: Secondary | ICD-10-CM | POA: Diagnosis not present

## 2024-03-08 DIAGNOSIS — D124 Benign neoplasm of descending colon: Secondary | ICD-10-CM

## 2024-03-08 DIAGNOSIS — Z8249 Family history of ischemic heart disease and other diseases of the circulatory system: Secondary | ICD-10-CM

## 2024-03-08 DIAGNOSIS — I4819 Other persistent atrial fibrillation: Secondary | ICD-10-CM

## 2024-03-08 DIAGNOSIS — Z79890 Hormone replacement therapy (postmenopausal): Secondary | ICD-10-CM

## 2024-03-08 DIAGNOSIS — I5023 Acute on chronic systolic (congestive) heart failure: Secondary | ICD-10-CM | POA: Diagnosis not present

## 2024-03-08 DIAGNOSIS — N184 Chronic kidney disease, stage 4 (severe): Secondary | ICD-10-CM | POA: Diagnosis present

## 2024-03-08 DIAGNOSIS — Z5181 Encounter for therapeutic drug level monitoring: Secondary | ICD-10-CM

## 2024-03-08 DIAGNOSIS — Z7901 Long term (current) use of anticoagulants: Secondary | ICD-10-CM

## 2024-03-08 DIAGNOSIS — Z87440 Personal history of urinary (tract) infections: Secondary | ICD-10-CM

## 2024-03-08 DIAGNOSIS — I5043 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) and diastolic (congestive) heart failure: Secondary | ICD-10-CM | POA: Diagnosis not present

## 2024-03-08 DIAGNOSIS — I5022 Chronic systolic (congestive) heart failure: Secondary | ICD-10-CM

## 2024-03-08 DIAGNOSIS — D122 Benign neoplasm of ascending colon: Secondary | ICD-10-CM | POA: Diagnosis not present

## 2024-03-08 DIAGNOSIS — K229 Disease of esophagus, unspecified: Secondary | ICD-10-CM | POA: Diagnosis not present

## 2024-03-08 DIAGNOSIS — E1122 Type 2 diabetes mellitus with diabetic chronic kidney disease: Secondary | ICD-10-CM | POA: Diagnosis present

## 2024-03-08 DIAGNOSIS — I429 Cardiomyopathy, unspecified: Secondary | ICD-10-CM | POA: Diagnosis present

## 2024-03-08 DIAGNOSIS — R001 Bradycardia, unspecified: Secondary | ICD-10-CM | POA: Diagnosis present

## 2024-03-08 DIAGNOSIS — I700 Atherosclerosis of aorta: Secondary | ICD-10-CM | POA: Diagnosis present

## 2024-03-08 DIAGNOSIS — D6832 Hemorrhagic disorder due to extrinsic circulating anticoagulants: Secondary | ICD-10-CM | POA: Diagnosis present

## 2024-03-08 DIAGNOSIS — R1310 Dysphagia, unspecified: Secondary | ICD-10-CM | POA: Diagnosis present

## 2024-03-08 DIAGNOSIS — R7989 Other specified abnormal findings of blood chemistry: Secondary | ICD-10-CM | POA: Diagnosis not present

## 2024-03-08 DIAGNOSIS — R079 Chest pain, unspecified: Secondary | ICD-10-CM

## 2024-03-08 DIAGNOSIS — K921 Melena: Principal | ICD-10-CM

## 2024-03-08 DIAGNOSIS — R195 Other fecal abnormalities: Secondary | ICD-10-CM

## 2024-03-08 DIAGNOSIS — I480 Paroxysmal atrial fibrillation: Secondary | ICD-10-CM | POA: Diagnosis present

## 2024-03-08 DIAGNOSIS — I2510 Atherosclerotic heart disease of native coronary artery without angina pectoris: Secondary | ICD-10-CM | POA: Diagnosis not present

## 2024-03-08 DIAGNOSIS — Z01810 Encounter for preprocedural cardiovascular examination: Secondary | ICD-10-CM | POA: Diagnosis not present

## 2024-03-08 DIAGNOSIS — F17210 Nicotine dependence, cigarettes, uncomplicated: Secondary | ICD-10-CM | POA: Diagnosis present

## 2024-03-08 DIAGNOSIS — Z955 Presence of coronary angioplasty implant and graft: Secondary | ICD-10-CM

## 2024-03-08 DIAGNOSIS — E038 Other specified hypothyroidism: Secondary | ICD-10-CM | POA: Diagnosis not present

## 2024-03-08 DIAGNOSIS — I2489 Other forms of acute ischemic heart disease: Secondary | ICD-10-CM | POA: Diagnosis present

## 2024-03-08 DIAGNOSIS — Z89421 Acquired absence of other right toe(s): Secondary | ICD-10-CM

## 2024-03-08 DIAGNOSIS — I252 Old myocardial infarction: Secondary | ICD-10-CM

## 2024-03-08 DIAGNOSIS — E7849 Other hyperlipidemia: Secondary | ICD-10-CM

## 2024-03-08 DIAGNOSIS — K529 Noninfective gastroenteritis and colitis, unspecified: Secondary | ICD-10-CM | POA: Diagnosis present

## 2024-03-08 DIAGNOSIS — K922 Gastrointestinal hemorrhage, unspecified: Secondary | ICD-10-CM | POA: Diagnosis present

## 2024-03-08 DIAGNOSIS — E1151 Type 2 diabetes mellitus with diabetic peripheral angiopathy without gangrene: Secondary | ICD-10-CM

## 2024-03-08 DIAGNOSIS — I5020 Unspecified systolic (congestive) heart failure: Secondary | ICD-10-CM | POA: Diagnosis present

## 2024-03-08 DIAGNOSIS — E119 Type 2 diabetes mellitus without complications: Secondary | ICD-10-CM

## 2024-03-08 DIAGNOSIS — M62838 Other muscle spasm: Secondary | ICD-10-CM | POA: Diagnosis present

## 2024-03-08 DIAGNOSIS — E1165 Type 2 diabetes mellitus with hyperglycemia: Secondary | ICD-10-CM | POA: Diagnosis present

## 2024-03-08 DIAGNOSIS — K2289 Other specified disease of esophagus: Secondary | ICD-10-CM | POA: Diagnosis present

## 2024-03-08 DIAGNOSIS — Z794 Long term (current) use of insulin: Secondary | ICD-10-CM

## 2024-03-08 DIAGNOSIS — I6529 Occlusion and stenosis of unspecified carotid artery: Secondary | ICD-10-CM | POA: Diagnosis present

## 2024-03-08 DIAGNOSIS — Z89422 Acquired absence of other left toe(s): Secondary | ICD-10-CM

## 2024-03-08 DIAGNOSIS — Z6838 Body mass index (BMI) 38.0-38.9, adult: Secondary | ICD-10-CM

## 2024-03-08 DIAGNOSIS — I130 Hypertensive heart and chronic kidney disease with heart failure and stage 1 through stage 4 chronic kidney disease, or unspecified chronic kidney disease: Secondary | ICD-10-CM | POA: Diagnosis present

## 2024-03-08 DIAGNOSIS — D125 Benign neoplasm of sigmoid colon: Secondary | ICD-10-CM

## 2024-03-08 DIAGNOSIS — Z5982 Transportation insecurity: Secondary | ICD-10-CM

## 2024-03-08 DIAGNOSIS — D123 Benign neoplasm of transverse colon: Secondary | ICD-10-CM | POA: Diagnosis not present

## 2024-03-08 DIAGNOSIS — E039 Hypothyroidism, unspecified: Secondary | ICD-10-CM | POA: Diagnosis present

## 2024-03-08 DIAGNOSIS — I21A1 Myocardial infarction type 2: Secondary | ICD-10-CM | POA: Diagnosis present

## 2024-03-08 DIAGNOSIS — K5909 Other constipation: Secondary | ICD-10-CM | POA: Diagnosis present

## 2024-03-08 DIAGNOSIS — E785 Hyperlipidemia, unspecified: Secondary | ICD-10-CM | POA: Diagnosis present

## 2024-03-08 DIAGNOSIS — J449 Chronic obstructive pulmonary disease, unspecified: Secondary | ICD-10-CM | POA: Diagnosis present

## 2024-03-08 DIAGNOSIS — R0789 Other chest pain: Secondary | ICD-10-CM | POA: Diagnosis not present

## 2024-03-08 DIAGNOSIS — D649 Anemia, unspecified: Principal | ICD-10-CM

## 2024-03-08 DIAGNOSIS — I959 Hypotension, unspecified: Secondary | ICD-10-CM | POA: Diagnosis present

## 2024-03-08 DIAGNOSIS — Z95828 Presence of other vascular implants and grafts: Secondary | ICD-10-CM

## 2024-03-08 DIAGNOSIS — Z7985 Long-term (current) use of injectable non-insulin antidiabetic drugs: Secondary | ICD-10-CM

## 2024-03-08 DIAGNOSIS — K920 Hematemesis: Secondary | ICD-10-CM | POA: Diagnosis not present

## 2024-03-08 DIAGNOSIS — E669 Obesity, unspecified: Secondary | ICD-10-CM | POA: Diagnosis present

## 2024-03-08 DIAGNOSIS — Z7951 Long term (current) use of inhaled steroids: Secondary | ICD-10-CM

## 2024-03-08 DIAGNOSIS — I10 Essential (primary) hypertension: Secondary | ICD-10-CM | POA: Diagnosis not present

## 2024-03-08 DIAGNOSIS — Z79899 Other long term (current) drug therapy: Secondary | ICD-10-CM

## 2024-03-08 DIAGNOSIS — K219 Gastro-esophageal reflux disease without esophagitis: Secondary | ICD-10-CM | POA: Diagnosis present

## 2024-03-08 DIAGNOSIS — B3781 Candidal esophagitis: Secondary | ICD-10-CM | POA: Diagnosis not present

## 2024-03-08 DIAGNOSIS — E869 Volume depletion, unspecified: Secondary | ICD-10-CM | POA: Diagnosis present

## 2024-03-08 LAB — CBC WITH DIFFERENTIAL/PLATELET
Abs Immature Granulocytes: 0.14 10*3/uL — ABNORMAL HIGH (ref 0.00–0.07)
Basophils Absolute: 0 10*3/uL (ref 0.0–0.1)
Basophils Relative: 0 %
Eosinophils Absolute: 0.2 10*3/uL (ref 0.0–0.5)
Eosinophils Relative: 2 %
HCT: 17.9 % — ABNORMAL LOW (ref 36.0–46.0)
Hemoglobin: 5.3 g/dL — CL (ref 12.0–15.0)
Immature Granulocytes: 1 %
Lymphocytes Relative: 13 %
Lymphs Abs: 1.6 10*3/uL (ref 0.7–4.0)
MCH: 31.4 pg (ref 26.0–34.0)
MCHC: 29.6 g/dL — ABNORMAL LOW (ref 30.0–36.0)
MCV: 105.9 fL — ABNORMAL HIGH (ref 80.0–100.0)
Monocytes Absolute: 0.9 10*3/uL (ref 0.1–1.0)
Monocytes Relative: 7 %
Neutro Abs: 9.5 10*3/uL — ABNORMAL HIGH (ref 1.7–7.7)
Neutrophils Relative %: 77 %
Platelets: 218 10*3/uL (ref 150–400)
RBC: 1.69 MIL/uL — ABNORMAL LOW (ref 3.87–5.11)
RDW: 17.9 % — ABNORMAL HIGH (ref 11.5–15.5)
WBC: 12.3 10*3/uL — ABNORMAL HIGH (ref 4.0–10.5)
nRBC: 0.2 % (ref 0.0–0.2)

## 2024-03-08 LAB — COMPREHENSIVE METABOLIC PANEL WITH GFR
ALT: 13 U/L (ref 0–44)
AST: 19 U/L (ref 15–41)
Albumin: 3 g/dL — ABNORMAL LOW (ref 3.5–5.0)
Alkaline Phosphatase: 66 U/L (ref 38–126)
Anion gap: 14 (ref 5–15)
BUN: 93 mg/dL — ABNORMAL HIGH (ref 8–23)
CO2: 25 mmol/L (ref 22–32)
Calcium: 9.2 mg/dL (ref 8.9–10.3)
Chloride: 99 mmol/L (ref 98–111)
Creatinine, Ser: 2.32 mg/dL — ABNORMAL HIGH (ref 0.44–1.00)
GFR, Estimated: 23 mL/min — ABNORMAL LOW (ref >60)
Glucose, Bld: 220 mg/dL — ABNORMAL HIGH (ref 70–99)
Potassium: 4.2 mmol/L (ref 3.5–5.1)
Sodium: 138 mmol/L (ref 135–145)
Total Bilirubin: 0.5 mg/dL (ref 0.0–1.2)
Total Protein: 6.3 g/dL — ABNORMAL LOW (ref 6.5–8.1)

## 2024-03-08 LAB — POC OCCULT BLOOD, ED: Fecal Occult Bld: POSITIVE — AB

## 2024-03-08 LAB — CBG MONITORING, ED: Glucose-Capillary: 195 mg/dL — ABNORMAL HIGH (ref 70–99)

## 2024-03-08 LAB — BRAIN NATRIURETIC PEPTIDE: B Natriuretic Peptide: 666.1 pg/mL — ABNORMAL HIGH (ref 0.0–100.0)

## 2024-03-08 LAB — TROPONIN I (HIGH SENSITIVITY)
Troponin I (High Sensitivity): 94 ng/L — ABNORMAL HIGH (ref <18)
Troponin I (High Sensitivity): 106 ng/L (ref <18)

## 2024-03-08 LAB — PREPARE RBC (CROSSMATCH)

## 2024-03-08 MED ORDER — FENTANYL CITRATE PF 50 MCG/ML IJ SOSY
50.0000 ug | PREFILLED_SYRINGE | Freq: Once | INTRAMUSCULAR | Status: AC
  Administered 2024-03-08: 50 ug via INTRAVENOUS
  Filled 2024-03-08: qty 1

## 2024-03-08 MED ORDER — MOMETASONE FURO-FORMOTEROL FUM 200-5 MCG/ACT IN AERO
2.0000 | INHALATION_SPRAY | Freq: Two times a day (BID) | RESPIRATORY_TRACT | Status: DC
  Administered 2024-03-09 – 2024-03-17 (×12): 2 via RESPIRATORY_TRACT
  Filled 2024-03-08: qty 8.8

## 2024-03-08 MED ORDER — GABAPENTIN 100 MG PO CAPS: 100.0000 mg | ORAL_CAPSULE | Freq: Three times a day (TID) | ORAL | Status: DC

## 2024-03-08 MED ORDER — DEXTROSE 50 % IV SOLN: 1.0000 | INTRAVENOUS | Status: DC | PRN

## 2024-03-08 MED ORDER — SODIUM CHLORIDE 0.9 % IV SOLN: 250.0000 mL | INTRAVENOUS | Status: AC | PRN

## 2024-03-08 MED ORDER — SODIUM CHLORIDE 0.9 % IV BOLUS
500.0000 mL | INTRAVENOUS | Status: AC
  Administered 2024-03-08: 500 mL via INTRAVENOUS

## 2024-03-08 MED ORDER — SODIUM CHLORIDE 0.9% FLUSH
3.0000 mL | Freq: Two times a day (BID) | INTRAVENOUS | Status: DC
  Administered 2024-03-08 – 2024-03-17 (×15): 3 mL via INTRAVENOUS

## 2024-03-08 MED ORDER — ACETAMINOPHEN 650 MG RE SUPP: 650.0000 mg | Freq: Four times a day (QID) | RECTAL | Status: DC | PRN

## 2024-03-08 MED ORDER — METOPROLOL SUCCINATE ER 25 MG PO TB24
25.0000 mg | ORAL_TABLET | Freq: Every day | ORAL | Status: DC
  Administered 2024-03-09 – 2024-03-17 (×9): 25 mg via ORAL
  Filled 2024-03-08 (×9): qty 1

## 2024-03-08 MED ORDER — BUPROPION HCL ER (XL) 150 MG PO TB24
300.0000 mg | ORAL_TABLET | Freq: Every morning | ORAL | Status: DC
  Administered 2024-03-09 – 2024-03-17 (×9): 300 mg via ORAL
  Filled 2024-03-08 (×9): qty 2

## 2024-03-08 MED ORDER — LEVOTHYROXINE SODIUM 50 MCG PO TABS
50.0000 ug | ORAL_TABLET | Freq: Every day | ORAL | Status: DC
Start: 2024-03-09 — End: 2024-03-17
  Administered 2024-03-09 – 2024-03-17 (×8): 50 ug via ORAL
  Filled 2024-03-08 (×3): qty 1
  Filled 2024-03-08: qty 2
  Filled 2024-03-08 (×4): qty 1

## 2024-03-08 MED ORDER — ALBUTEROL SULFATE (2.5 MG/3ML) 0.083% IN NEBU: 2.5000 mg | INHALATION_SOLUTION | RESPIRATORY_TRACT | Status: DC | PRN

## 2024-03-08 MED ORDER — SODIUM CHLORIDE 0.9% FLUSH
3.0000 mL | INTRAVENOUS | Status: DC | PRN
Start: 2024-03-08 — End: 2024-03-17
  Administered 2024-03-11 (×2): 3 mL via INTRAVENOUS

## 2024-03-08 MED ORDER — METHOCARBAMOL 500 MG PO TABS
500.0000 mg | ORAL_TABLET | Freq: Three times a day (TID) | ORAL | Status: DC | PRN
  Administered 2024-03-09 – 2024-03-17 (×13): 500 mg via ORAL
  Filled 2024-03-08 (×13): qty 1

## 2024-03-08 MED ORDER — ONDANSETRON HCL 4 MG PO TABS: 4.0000 mg | ORAL_TABLET | Freq: Four times a day (QID) | ORAL | Status: DC | PRN

## 2024-03-08 MED ORDER — ALLOPURINOL 300 MG PO TABS
150.0000 mg | ORAL_TABLET | Freq: Every day | ORAL | Status: DC
  Administered 2024-03-09 – 2024-03-17 (×9): 150 mg via ORAL
  Filled 2024-03-08 (×6): qty 1
  Filled 2024-03-08: qty 2
  Filled 2024-03-08 (×2): qty 1

## 2024-03-08 MED ORDER — SODIUM CHLORIDE 0.9% FLUSH
3.0000 mL | Freq: Two times a day (BID) | INTRAVENOUS | Status: DC
  Administered 2024-03-08 – 2024-03-17 (×15): 3 mL via INTRAVENOUS

## 2024-03-08 MED ORDER — PANTOPRAZOLE SODIUM 40 MG IV SOLR
40.0000 mg | Freq: Two times a day (BID) | INTRAVENOUS | Status: DC
  Administered 2024-03-09 – 2024-03-13 (×10): 40 mg via INTRAVENOUS
  Filled 2024-03-08 (×10): qty 10

## 2024-03-08 MED ORDER — ACETAMINOPHEN 325 MG PO TABS
650.0000 mg | ORAL_TABLET | Freq: Four times a day (QID) | ORAL | Status: DC | PRN
  Administered 2024-03-09 – 2024-03-17 (×13): 650 mg via ORAL
  Filled 2024-03-08 (×14): qty 2

## 2024-03-08 MED ORDER — BUPROPION HCL ER (SR) 100 MG PO TB12: 100.0000 mg | ORAL_TABLET | Freq: Every day | ORAL | Status: DC

## 2024-03-08 MED ORDER — ONDANSETRON HCL 4 MG/2ML IJ SOLN: 4.0000 mg | Freq: Four times a day (QID) | INTRAMUSCULAR | Status: DC | PRN

## 2024-03-08 MED ORDER — INSULIN GLARGINE-YFGN 100 UNIT/ML ~~LOC~~ SOLN
35.0000 [IU] | Freq: Every day | SUBCUTANEOUS | Status: DC
  Administered 2024-03-08 – 2024-03-09 (×2): 35 [IU] via SUBCUTANEOUS
  Filled 2024-03-08 (×2): qty 0.35

## 2024-03-08 MED ORDER — PANTOPRAZOLE SODIUM 40 MG IV SOLR
80.0000 mg | Freq: Once | INTRAVENOUS | Status: AC
  Administered 2024-03-08: 80 mg via INTRAVENOUS
  Filled 2024-03-08: qty 20

## 2024-03-08 MED ORDER — SODIUM CHLORIDE 0.9% IV SOLUTION: Freq: Once | INTRAVENOUS | Status: AC

## 2024-03-08 MED ORDER — ROSUVASTATIN CALCIUM 20 MG PO TABS
40.0000 mg | ORAL_TABLET | Freq: Every day | ORAL | Status: DC
  Administered 2024-03-09 – 2024-03-17 (×9): 40 mg via ORAL
  Filled 2024-03-08 (×9): qty 2

## 2024-03-08 NOTE — ED Triage Notes (Signed)
 According to [facility]: Pt arrived from home. Pt has been complaining fo chest pain for 3 days. Pain is radiating from chest to [facility]. Pain not reproducible. 12 lead normal. Pt was given  0.4mg  of nitroglycerin  and 324 mg of Asprin was given. Pressure want from 130 to 100 systolic so no more nitroglycerin  given.   Hx of mi and cardiac stent.  Vitals BP 100 palpated systolic Hr 90  sinus rhythm 20 rr 99 spo2

## 2024-03-08 NOTE — ED Provider Notes (Signed)
 [place] [facility] AT [facility] Provider Note   CSN: [id number] Arrival date & time: 03/08/24  1734     History  Chief Complaint  Patient presents with   Chest Pain    [name] is a 63 y.o. female history of of A-fib on Eliquis , heart failure, hypertension here presenting with chest pain.  Patient states that she has been having substernal chest pain for 2 to 3 days.  Patient states that her kidney function has been deteriorating so her doctor stopped her losartan  and added hydralazine  yesterday.  Patient states that subsequently, her chest pain got worse.  Patient was noted to be hypotensive with blood pressure around 100.  Patient was given nitro and aspirin  by EMS.  Patient denies any history of cardiac stents but does follow-up with Dr. Stella Edward test.  Patient is currently taking torsemide  for diuresis.  Patient does have some shortness of breath especially with laying down.  She states that her leg swelling has been chronic and unchanged  The history is provided by the patient.       Home Medications Prior to Admission medications   Medication Sig Start Date End Date Taking? Authorizing Provider  ACCU-CHEK GUIDE test strip USE TO CHECK BLOOD SUGAR FOUR TIMES DAILY 02/25/20   [provider]  acetaminophen  (TYLENOL ) 500 MG tablet Take 1,000 mg by mouth every 6 (six) hours as needed for headache (pain).    [provider]  albuterol  (VENTOLIN  HFA) 108 (90 Base) MCG/ACT inhaler USE 2 PUFFS BY MOUTH EVERY FOUR HOURS, AS NEEDED, FOR COUGHING/WHEEZING 07/12/23   Newlin, [name], MD  allopurinol  (ZYLOPRIM ) 100 MG tablet Take 150 mg by mouth daily.    [provider]  apixaban  (ELIQUIS ) 5 MG TABS tablet Take 1 tablet (5 mg total) by mouth 2 (two) times daily. 07/05/23   Clegg, [name] D, NP  budesonide -formoterol  (SYMBICORT ) 160-4.5 MCG/ACT inhaler Inhale 2 puffs into the lungs 2 (two) times daily. 07/12/23   Newlin, [name], MD  buPROPion   ER (WELLBUTRIN  SR) 100 MG 12 hr tablet Take 1 tablet (100 mg total) by mouth daily. 07/05/23   Clegg, [name] D, NP  Cholecalciferol  (VITAMIN D3 PO) Take 1 capsule by mouth every morning.    [provider]  colchicine  0.6 MG tablet Take 1.2 mg by mouth See admin instructions. Take 1.2 mg for flair up and an hour later take 0.6 mg if needed    [provider]  Continuous Glucose Receiver (FREESTYLE LIBRE 3 READER) DEVI 1 Device by Does not apply route continuous. 01/25/24   Motwani, Komal, MD  Continuous Glucose Sensor (FREESTYLE LIBRE 3 PLUS SENSOR) MISC Change sensor every 15 days. 01/25/24   Motwani, Komal, MD  EASY COMFORT PEN NEEDLES 31G X 5 MM MISC USE FOUR TIMES A DAY FOR INSULIN  ADMINISTRATION 08/30/22   Dulce Gibbs M, PA-C  fluticasone  [facility]) 50 MCG/ACT nasal spray Place 1 spray into both nostrils as needed for allergies or rhinitis.    [provider]  Furosemide  (FUROSCIX  [place]) Inject into the skin as directed. As needed    [provider]  gabapentin  (NEURONTIN ) 100 MG capsule Take 1 capsule (100 mg total) by mouth 3 (three) times daily. Patient taking differently: Take 100 mg by mouth 3 (three) times daily. Patient takes 1 tablet by mouth at bedtime 02/05/24 05/05/24  Motwani, Komal, MD  hydrALAZINE  (APRESOLINE ) 25 MG tablet Take 1 tablet (25 mg total) by mouth 3 (three) times daily. 02/29/24 05/29/24  Lee, [place], NP  insulin  lispro (HUMALOG ) 100 UNIT/ML KwikPen Inject 20 Units into the skin 3 (three) times daily with meals. Plus sliding scale, max dose 70 units /day 03/07/24   Jorge Newcomer, MD  LANTUS  SOLOSTAR 100 UNIT/ML Solostar Pen Inject 37 Units into the skin daily. Patient taking differently: Inject 50 Units into the skin daily. 04/05/23   Newlin, [name], MD  levothyroxine  (SYNTHROID ) 50 MCG tablet TAKE 1 TABLET (50 MCG TOTAL) BY MOUTH DAILY BEFORE BREAKFAST (AM) 05/02/23   Newlin, [name], MD  metoprolol  succinate (TOPROL -XL) 25 MG 24 hr tablet Take  1 tablet (25 mg total) by mouth daily. 10/19/23   Ruddy Corral M, PA-C  Omega-3 Fatty Acids (FISH OIL PO) Take 1 tablet by mouth daily.    [provider]  pantoprazole  (PROTONIX ) 40 MG tablet TAKE 1 TABLET (40 MG TOTAL) BY MOUTH DAILY(AM) 05/02/23   Newlin, [name], MD  polyethylene glycol (MIRALAX  / GLYCOLAX ) 17 g packet Take 17 g by mouth daily as needed for moderate constipation. 08/05/23   Akula, Vijaya, MD  rosuvastatin  (CRESTOR ) 40 MG tablet TAKE 1 TABLET (40 MG TOTAL) BY MOUTH DAILY Patient taking differently: Take 40 mg by mouth at bedtime. 11/23/23   Avanell Leigh, MD  tirzepatide [facility]) 7.5 MG/0.5ML Pen Inject 7.5 mg into the skin once a week. 03/05/24   Motwani, Komal, MD  torsemide  (DEMADEX ) 100 MG tablet Take 1 tablet (100 mg total) by mouth 2 (two) times daily. 02/26/24   Clegg, [name] D, NP  triamcinolone  ointment (KENALOG ) 0.1 % Apply topically 2 (two) times daily. 08/09/23   Newlin, [name], MD  VITAMIN E PO Take 1 tablet by mouth daily.    [provider]  tiotropium (SPIRIVA  HANDIHALER) 18 MCG inhalation capsule Place 1 capsule (18 mcg total) into inhaler and inhale every morning. Patient not taking: Reported on 02/09/2021 12/09/19 02/09/21  Armenta Landau, MD      Allergies    Gabapentin  and Lyrica  [pregabalin ]    Review of Systems   Review of Systems  Cardiovascular:  Positive for chest pain.  All other systems reviewed and are negative.   Physical Exam Updated Vital Signs BP (!) 110/41   Pulse 99   Temp 98.1 F (36.7 C) (Oral)   Resp 19   LMP  (LMP Unknown)   SpO2 98%  Physical Exam Vitals and nursing note reviewed.  Constitutional:      Appearance: Normal appearance.  HENT:     Head: Normocephalic.     Nose: Nose normal.     Mouth/Throat:     Mouth: Mucous membranes are moist.  Eyes:     Extraocular Movements: Extraocular movements intact.     Pupils: Pupils are equal, round, and reactive to light.  Cardiovascular:     Rate  and Rhythm: Normal rate and regular rhythm.     Pulses: Normal pulses.     Heart sounds: Normal heart sounds.  Pulmonary:     Effort: Pulmonary effort is normal.     Comments: Diminished bilaterally  Abdominal:     General: Abdomen is flat.     Palpations: Abdomen is soft.  Musculoskeletal:        General: Normal range of motion.     Cervical back: Normal range of motion and neck supple.     Comments: 1+ edema bilaterally   Skin:    General: Skin is warm.  Neurological:     General: No focal deficit present.  Mental Status: She is alert and oriented to person, place, and time.  Psychiatric:        Mood and Affect: Mood normal.        Behavior: Behavior normal.     ED Results / Procedures / Treatments   Labs (all labs ordered are listed, but only abnormal results are displayed) Labs Reviewed  CBC WITH DIFFERENTIAL/PLATELET - Abnormal; Notable for the following components:      Result Value   WBC 12.3 (*)    RBC 1.69 (*)    Hemoglobin 5.3 (*)    HCT 17.9 (*)    MCV 105.9 (*)    MCHC 29.6 (*)    RDW 17.9 (*)    Neutro Abs 9.5 (*)    Abs Immature Granulocytes 0.14 (*)    All other components within normal limits  COMPREHENSIVE METABOLIC PANEL WITH GFR - Abnormal; Notable for the following components:   Glucose, Bld 220 (*)    BUN 93 (*)    Creatinine, Ser 2.32 (*)    Total Protein 6.3 (*)    Albumin  3.0 (*)    GFR, Estimated 23 (*)    All other components within normal limits  BRAIN NATRIURETIC PEPTIDE - Abnormal; Notable for the following components:   B Natriuretic Peptide 666.1 (*)    All other components within normal limits  POC OCCULT BLOOD, ED - Abnormal; Notable for the following components:   Fecal Occult Bld POSITIVE (*)    All other components within normal limits  TROPONIN I (HIGH SENSITIVITY) - Abnormal; Notable for the following components:   Troponin I (High Sensitivity) 94 (*)    All other components within normal limits  HEMOGLOBIN AND  HEMATOCRIT, BLOOD  TYPE AND SCREEN  PREPARE RBC (CROSSMATCH)  TROPONIN I (HIGH SENSITIVITY)    EKG EKG Interpretation Date/Time:  Friday March 08 2024 18:06:30 EDT Ventricular Rate:  99 PR Interval:  48 QRS Duration:  136 QT Interval:  363 QTC Calculation: 466 R Axis:   1  Text Interpretation: afib LVH with secondary repolarization abnormality No significant change since last tracing Confirmed by Florette Hurry [phone]) on 03/08/2024 6:13:08 PM  Radiology DG Chest Port 1 View Result Date: 03/08/2024 CLINICAL DATA:  chest pain EXAM: PORTABLE CHEST 1 VIEW COMPARISON:  Chest x-ray 01/03/2024, CT chest 02/14/2023 FINDINGS: The heart and mediastinal contours are unchanged. Atherosclerotic plaque. Bibasilar atelectasis. No focal consolidation. No pulmonary edema. No pleural effusion. No pneumothorax. No acute osseous abnormality. IMPRESSION: 1. No active disease. 2.  Aortic Atherosclerosis (ICD10-I70.0). Electronically Signed   By: Morgane  Naveau M.D.   On: 03/08/2024 18:48    Procedures Procedures    CRITICAL CARE Performed by: Florette Hurry   Total critical care time: 39 minutes  Critical care time was exclusive of separately billable procedures and treating other patients.  Critical care was necessary to treat or prevent imminent or life-threatening deterioration.  Critical care was time spent personally by me on the following activities: development of treatment plan with patient and/or surrogate as well as nursing, discussions with consultants, evaluation of patient's response to treatment, examination of patient, obtaining history from patient or surrogate, ordering and performing treatments and interventions, ordering and review of laboratory studies, ordering and review of radiographic studies, pulse oximetry and re-evaluation of patient's condition.   Medications Ordered in ED Medications  0.9 %  sodium chloride  infusion (Manually program via Guardrails IV Fluids) (has no  administration in time range)  pantoprazole  (PROTONIX ) injection  80 mg (has no administration in time range)  fentaNYL  (SUBLIMAZE ) injection 50 mcg (50 mcg Intravenous Given 03/08/24 1856)    ED Course/ Medical Decision Making/ A&P                                 Medical Decision Making JAYLAH GOODLOW is a 63 y.o. female here presenting with chest pain and shortness of breath.  Consider heart failure exacerbation versus ACS.  Plan to get CBC and CMP and BNP and troponin x 2.  Will give pain meds and reassess  8:22 PM Hemoglobin is 5.3.  I talked to her regarding her symptoms.  She states that she has been having some dark stools for several weeks now.  Patient is on Eliquis .  Concern for slow upper GI bleed.  She is guaiac positive with dark stool on my exam.  Started patient on Protonix .  Also ordered 2 unit PRBC infusion.  I also messaged Dr. Nickey Barn from GI to see patient.  He recommend n.p.o. after midnight.  Hospitalist to admit.  Problems Addressed: Anemia, unspecified type: acute illness or injury Chest pain, unspecified type: acute illness or injury  Amount and/or Complexity of Data Reviewed Labs: ordered. Decision-making details documented in ED Course. Radiology: ordered and independent interpretation performed. Decision-making details documented in ED Course. ECG/medicine tests: ordered and independent interpretation performed. Decision-making details documented in ED Course.  Risk Prescription drug management. Decision regarding hospitalization.    Final Clinical Impression(s) / ED Diagnoses Final diagnoses:  None    Rx / DC Orders ED Discharge Orders     None         Dalene Duck, MD 03/08/24 2024

## 2024-03-08 NOTE — H&P (Addendum)
 History and Physical    [name] BJY:[id number] DOB: 04-03-1961 DOA: 03/08/2024  PCP: Maryellen Snare, NP   Patient coming from: Home   Chief Complaint:  Chief Complaint  Patient presents with   Chest Pain   ED TRIAGE note:  According to [facility]: Pt arrived from home. Pt has been complaining fo chest pain for 3 days. Pain is radiating from chest to neck. Pain not reproducible. 12 lead normal. Pt was given  0.4mg  of nitroglycerin  and 324 mg of Asprin was given. Pressure want from 130 to 100 systolic so no more nitroglycerin  given.    Hx of mi and cardiac stent.   Vitals BP 100 palpated systolic Hr 90  sinus rhythm 20 rr 99 spo2      HPI:  [name] is a 63 y.o. female with medical history significant of CAD, HFrEF 40 to 45% and grade 2 diastolic heart failure, CAD, CKD stage IV, carotid stenosis s/p endarterectomy, paroxysmal atrial fibrillation, PAD, COPD, chronic smoking cigarette, chronic bradycardia and prior substance abuse history gout,  essential hypertension, insulin -dependent DM type II, hyperlipidemia, and vitamin D  deficiency presented emergency department complaining of chest pain for 2 to 3 days.  Patient reported that she has ongoing chest pain which is persistent in nature and radiates to the neck.  EMS found that patient is hypotensive which limited use of nitroglycerin .  En route to ED EMS gave aspirin  324 mg.  Patient is also complaining about orthopnea dyspnea and PND.  As well as complaining about bilateral lower extremity swelling however which is chronic in nature. Patient reported that over the course of the last few months she is having intermittent dark-colored stool.  Patient denies any abdominal pain, hematemesis and hemoptysis.   Reported that she has ongoing muscle cramps bilateral upper and lower extremities for 6 to 6 months.  Intolerance to gabapentin  due to tremor. Per patient did not have any history of GI bleed last, EGD and  colonoscopy.    ED Course:  At presentation to ED patient is borderline hypotensive blood pressure 110/41, tachycardic 99-108.  O2 sat 100% room air. Troponin 94.  Pending second troponin. CBC showing low hemoglobin 5.3 (baseline hemoglobin around 10-11.  Elevated WBC count 12.3 for last 2 months.  Normal platelet count. CMP showing creatinine 2.32 and GFR 23.  Renal function at baseline.  Otherwise unremarkable CMP. Elevated BNP 666. EKG showing atrial fibrillation heart rate 99, left ventricular hypertrophy pattern. Chest ray showing no acute disease process.  Aortic atherosclerosis.  Dr.Yao from bedside rectal exam which showed patient has melena colicky stool in the rectal vault. ED physician consulted [facility] Dr. Nickey Barn recommended keep patient n.p.o. and will see patient for consult in the a.m.  In the ED 2 unit blood transfusion has been ordered.  And patient received IV Protonix  80 mg.  Hospitalist has been consulted for further management and evaluation for GI bleed, elevated troponin secondary to demand ischemia and chest pain-secondary demand ischemia in setting of GI bleed.  Significant labs in the ED: Lab Orders         CBC with Differential         Comprehensive metabolic panel         Brain natriuretic peptide         Hemoglobin and hematocrit, blood         Hemoglobin and hematocrit, blood         CBC  Comprehensive metabolic panel         Protime-INR         APTT         POC occult blood, ED       Review of Systems:  Review of Systems  Constitutional:  Negative for chills, fever, malaise/fatigue and weight loss.  Respiratory:  Negative for cough, sputum production, shortness of breath and wheezing.   Cardiovascular:  Negative for chest pain, palpitations, orthopnea and leg swelling.  Gastrointestinal:  Positive for melena. Negative for abdominal pain, blood in stool, constipation, diarrhea, heartburn, nausea and vomiting.  Genitourinary:  Negative for  dysuria, frequency and urgency.  Musculoskeletal:  Negative for back pain, joint pain and myalgias.  Neurological:  Negative for dizziness and headaches.  Psychiatric/Behavioral:  The patient is not nervous/anxious.     Past Medical History:  Diagnosis Date   Acute renal failure ([name])    Acute respiratory failure with hypoxia ([name]) 12/02/2019   Alcohol abuse    Alcoholic cirrhosis ([name])    Anemia    Anxiety    Arthritis    "knees" (11/26/2018)   B12 deficiency    CAD (coronary artery disease)    a. 11/10/2014 Cath: LM nl, LAD min irregs, D1 30 ost, D2 50d, LCX 72m, OM1 80 p/m (1.5 mm vessel), OM2 52m, RCA nondom 33m-->med rx.. Demand ischemia in the setting of rapid a-fib.   Cardiomyopathy ([name])    Carotid artery disease ([name])    a. 01/2015 Carotid Angio: RICA 100, LICA 95p; b. 02/2015 s/p L CEA; c. 05/2019 Carotid U/S: RICA 100. RECA >50. LICA 1-39%.   Cellulitis    lower extremities   Cellulitis in diabetic foot ([name]) 07/08/2019   CHF (congestive heart failure) ([name])    Chronic combined systolic and diastolic CHF (congestive heart failure) ([name])    a. 10/2014 Echo: EF 40-45%; b. 10/2018 Echo: EF 45-50%, gr2 DD; c. 11/2019 Echo: EF 50%, mild LVH, gr2 DD (restrictive), antlat HK, Nl RV fxn. Mild BAE. RVSP 59.43mmHg.   CKD (chronic kidney disease), stage III ([name])    Cocaine abuse ([name])    COPD (chronic obstructive pulmonary disease) ([name])    Critical ischemia of foot ([name]) 06/07/2021   Critical limb ischemia     Critical lower limb ischemia ([name])    Demand ischemia ([name]) 10/29/2014   Depression    Diabetes mellitus without complication ([name])    Diabetic peripheral neuropathy ([name])    DVT (deep venous thrombosis) ([name])    Dyspnea    Elevated troponin    a. Chronic elevation.   Fall 05/05/2021   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[name])    Peripheral arterial disease/critical limb ischemia     GERD (gastroesophageal reflux disease)    Hyperlipemia    Hypertension    Hypokalemia     Hypomagnesemia    Hypothyroidism    Marijuana abuse    Narcotic abuse ([name])    Noncompliance    NSVT (nonsustained ventricular tachycardia) ([name])    Obesity    Osteomyelitis ([name]) 06/21/2019   PAF (paroxysmal atrial fibrillation) ([name])    Paroxysmal atrial tachycardia ([name])    Peripheral arterial disease ([name])    a. 01/2015 Angio/PTA: RSFA 99 (atherectomy/pta) - 1 vessel runoff via diff dzs peroneal; b. 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   Pneumonia    "once or twice" (11/26/2018)   Poorly controlled type 2 diabetes mellitus ([name])    Renal disorder  Renal insufficiency    a. 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   SIRS (systemic inflammatory response syndrome) ([name]) 04/06/2017   Sleep apnea    "couldn't handle wearing the mask" (11/26/2018)   Symptomatic bradycardia    a. Avoid AV blocking agent per EP. Prev req temp wire in 2017.   Tobacco abuse    Wrist fracture, left, closed, initial encounter 01/29/2015    Past Surgical History:  Procedure Laterality Date   ABDOMINAL AORTOGRAM N/A 06/26/2019   Procedure: ABDOMINAL AORTOGRAM;  Surgeon: Wenona Hamilton, MD;  Location: [facility]AS[facility] LAB;  Service: Cardiovascular;  Laterality: N/A;   ABDOMINAL AORTOGRAM W/LOWER EXTREMITY N/A 06/07/2021   Procedure: ABDOMINAL AORTOGRAM W/LOWER EXTREMITY;  Surgeon: Avanell Leigh, MD;  Location: [facility];  Service: Cardiovascular;  Laterality: N/A;   ABDOMINAL AORTOGRAM W/LOWER EXTREMITY N/A 08/11/2023   Procedure: ABDOMINAL AORTOGRAM W/LOWER EXTREMITY;  Surgeon: Carlene Che, MD;  Location: [facility];  Service: Cardiovascular;  Laterality: N/A;   AMPUTATION Right 06/14/2017   Procedure: Right foot transmetatarsal amputation;  Surgeon: Timothy Ford, MD;  Location: [facility] OR;  Service: Orthopedics;  Laterality: Right;   AMPUTATION Left 06/28/2019   Procedure: AMPUTATION LEFT FIFTH TOE;  Surgeon: Mayo Speck, MD;  Location: [facility] OR;  Service: Vascular;  Laterality:  Left;   AMPUTATION TOE Right 04/28/2017   Procedure: AMPUTATION OF RIGHT SECOND RAY;  Surgeon: Timothy Ford, MD;  Location: [facility] OR;  Service: Orthopedics;  Laterality: Right;   CARDIAC CATHETERIZATION     CARDIAC CATHETERIZATION N/A 01/12/2016   Procedure: Temporary Wire;  Surgeon: Eilleen Grates, MD;  Location: [facility];  Service: Cardiovascular;  Laterality: N/A;   CARDIOVERSION  ~ 02/2013   "twice"    CARDIOVERSION N/A 09/26/2023   Procedure: CARDIOVERSION (CATH LAB);  Surgeon: Bridgette Campus, MD;  Location: [facility] INVASIVE CV LAB;  Service: Cardiovascular;  Laterality: N/A;   CAROTID ANGIOGRAM N/A 01/15/2015   Procedure: CAROTID ANGIOGRAM;  Surgeon: Avanell Leigh, MD;  Location: [facility] CATH LAB;  Service: Cardiovascular;  Laterality: N/A;   DILATION AND CURETTAGE OF UTERUS  1988   ENDARTERECTOMY Left 02/19/2015   Procedure: LEFT CAROTID ENDARTERECTOMY ;  Surgeon: Margherita Shell, MD;  Location: [facility] OR;  Service: Vascular;  Laterality: Left;   FEMORAL-TIBIAL BYPASS GRAFT Left 06/28/2019   Procedure: BYPASS GRAFT LEFT LEG FEMORAL TO ANTERIOR TIBIAL ARTERY using LEFT GREATER SAPHENOUS VEIN;  Surgeon: Mayo Speck, MD;  Location: [facility] OR;  Service: Vascular;  Laterality: Left;   LEFT HEART CATHETERIZATION WITH CORONARY ANGIOGRAM N/A 10/31/2014   Procedure: LEFT HEART CATHETERIZATION WITH CORONARY ANGIOGRAM;  Surgeon: Odie Benne, MD;  Location: [facility] CATH LAB;  Service: Cardiovascular;  Laterality: N/A;   LOWER EXTREMITY ANGIOGRAM N/A 09/10/2013   Procedure: LOWER EXTREMITY ANGIOGRAM;  Surgeon: Avanell Leigh, MD;  Location: [facility] CATH LAB;  Service: Cardiovascular;  Laterality: N/A;   LOWER EXTREMITY ANGIOGRAM N/A 01/15/2015   Procedure: LOWER EXTREMITY ANGIOGRAM;  Surgeon: Avanell Leigh, MD;  Location: [facility] CATH LAB;  Service: Cardiovascular;  Laterality: N/A;   LOWER EXTREMITY ANGIOGRAPHY N/A 04/13/2017   Procedure: Lower Extremity Angiography;  Surgeon: Avanell Leigh, MD;  Location:  [facility] INVASIVE CV LAB;  Service: Cardiovascular;  Laterality: N/A;   LOWER EXTREMITY ANGIOGRAPHY Left 06/26/2019   Procedure: LOWER EXTREMITY ANGIOGRAPHY;  Surgeon: Wenona Hamilton, MD;  Location: [facility];  Service: Cardiovascular;  Laterality: Left;   PERIPHERAL VASCULAR ATHERECTOMY Right  06/07/2021   Procedure: PERIPHERAL VASCULAR ATHERECTOMY;  Surgeon: Avanell Leigh, MD;  Location: [facility] INVASIVE CV LAB;  Service: Cardiovascular;  Laterality: Right;   PERIPHERAL VASCULAR BALLOON ANGIOPLASTY Left 06/26/2019   Procedure: PERIPHERAL VASCULAR BALLOON ANGIOPLASTY;  Surgeon: Wenona Hamilton, MD;  Location: [facility];  Service: Cardiovascular;  Laterality: Left;  unable to cross lt sfa occlusion   PERIPHERAL VASCULAR INTERVENTION  04/13/2017   Procedure: Peripheral Vascular Intervention;  Surgeon: Avanell Leigh, MD;  Location: [facility] INVASIVE CV LAB;  Service: Cardiovascular;;   PERIPHERAL VASCULAR INTERVENTION  08/11/2023   Procedure: PERIPHERAL VASCULAR INTERVENTION;  Surgeon: Carlene Che, MD;  Location: [facility];  Service: Cardiovascular;;   PRESSURE SENSOR/CARDIOMEMS N/A 02/05/2020   Procedure: PRESSURE SENSOR/CARDIOMEMS;  Surgeon: Darlis Eisenmenger, MD;  Location: [facility] INVASIVE CV LAB;  Service: Cardiovascular;  Laterality: N/A;   VEIN HARVEST Left 06/28/2019   Procedure: LEFT LEG GREATER SAPHENOUS VEIN HARVEST;  Surgeon: Mayo Speck, MD;  Location: [facility] OR;  Service: Vascular;  Laterality: Left;     reports that she has been smoking e-cigarettes and cigarettes. She has a 44 pack-year smoking history. She has quit using smokeless tobacco.  Her smokeless tobacco use included snuff. She reports that she does not currently use alcohol. She reports that she does not currently use drugs after having used the following drugs: "Crack" cocaine, Marijuana, and Oxycodone .  Allergies  Allergen Reactions   Gabapentin  Nausea And Vomiting and Other (See Comments)    POSSIBLE  SHAKING   Lyrica  [Pregabalin ] Other (See Comments)    Shaking     Family History  Problem Relation Age of Onset   Hypertension Mother    Diabetes Mother    Cancer Mother        breast, ovarian, colon   Clotting disorder Mother    Heart disease Mother    Heart attack Mother    Breast cancer Mother        in 57's   Hypertension Father    Heart disease Father    Emphysema Sister        smoked    Prior to Admission medications   Medication Sig Start Date End Date Taking? Authorizing Provider  ACCU-CHEK GUIDE test strip USE TO CHECK BLOOD SUGAR FOUR TIMES DAILY 02/25/20   [provider]  acetaminophen  (TYLENOL ) 500 MG tablet Take 1,000 mg by mouth every 6 (six) hours as needed for headache (pain).    [provider]  albuterol  (VENTOLIN  HFA) 108 (90 Base) MCG/ACT inhaler USE 2 PUFFS BY MOUTH EVERY FOUR HOURS, AS NEEDED, FOR COUGHING/WHEEZING 07/12/23   Newlin, [name], MD  allopurinol  (ZYLOPRIM ) 100 MG tablet Take 150 mg by mouth daily.    [provider]  apixaban  (ELIQUIS ) 5 MG TABS tablet Take 1 tablet (5 mg total) by mouth 2 (two) times daily. 07/05/23   Clegg, [name] D, NP  budesonide -formoterol  (SYMBICORT ) 160-4.5 MCG/ACT inhaler Inhale 2 puffs into the lungs 2 (two) times daily. 07/12/23   Newlin, [name], MD  buPROPion  ER (WELLBUTRIN  SR) 100 MG 12 hr tablet Take 1 tablet (100 mg total) by mouth daily. 07/05/23   Clegg, [name] D, NP  Cholecalciferol  (VITAMIN D3 PO) Take 1 capsule by mouth every morning.    [provider]  colchicine  0.6 MG tablet Take 1.2 mg by mouth See admin instructions. Take 1.2 mg for flair up and an hour later take 0.6 mg if needed    [provider]  Continuous Glucose Receiver (FREESTYLE LIBRE 3 READER) DEVI 1 Device by Does not apply route continuous. 01/25/24   Motwani, Komal, MD  Continuous Glucose Sensor (FREESTYLE LIBRE 3 PLUS SENSOR) MISC Change sensor every 15 days. 01/25/24   Motwani, Komal, MD  EASY COMFORT PEN  NEEDLES 31G X 5 MM MISC USE FOUR TIMES A DAY FOR INSULIN  ADMINISTRATION 08/30/22   Hassie Lint, PA-C  fluticasone  [facility]) 50 MCG/ACT nasal spray Place 1 spray into both nostrils as needed for allergies or rhinitis.    [provider]  Furosemide  (FUROSCIX  [place]) Inject into the skin as directed. As needed    [provider]  gabapentin  (NEURONTIN ) 100 MG capsule Take 1 capsule (100 mg total) by mouth 3 (three) times daily. Patient taking differently: Take 100 mg by mouth 3 (three) times daily. Patient takes 1 tablet by mouth at bedtime 02/05/24 05/05/24  Motwani, Komal, MD  hydrALAZINE  (APRESOLINE ) 25 MG tablet Take 1 tablet (25 mg total) by mouth 3 (three) times daily. 02/29/24 05/29/24  Lee, [place], NP  insulin  lispro (HUMALOG ) 100 UNIT/ML KwikPen Inject 20 Units into the skin 3 (three) times daily with meals. Plus sliding scale, max dose 70 units /day 03/07/24   Motwani, Joanna Muck, MD  LANTUS  SOLOSTAR 100 UNIT/ML Solostar Pen Inject 37 Units into the skin daily. Patient taking differently: Inject 50 Units into the skin daily. 04/05/23   Newlin, [name], MD  levothyroxine  (SYNTHROID ) 50 MCG tablet TAKE 1 TABLET (50 MCG TOTAL) BY MOUTH DAILY BEFORE BREAKFAST (AM) 05/02/23   Newlin, [name], MD  metoprolol  succinate (TOPROL -XL) 25 MG 24 hr tablet Take 1 tablet (25 mg total) by mouth daily. 10/19/23   Ruddy Corral M, PA-C  Omega-3 Fatty Acids (FISH OIL PO) Take 1 tablet by mouth daily.    [provider]  pantoprazole  (PROTONIX ) 40 MG tablet TAKE 1 TABLET (40 MG TOTAL) BY MOUTH DAILY(AM) 05/02/23   Newlin, [name], MD  polyethylene glycol (MIRALAX  / GLYCOLAX ) 17 g packet Take 17 g by mouth daily as needed for moderate constipation. 08/05/23   Akula, Vijaya, MD  rosuvastatin  (CRESTOR ) 40 MG tablet TAKE 1 TABLET (40 MG TOTAL) BY MOUTH DAILY Patient taking differently: Take 40 mg by mouth at bedtime. 11/23/23   Avanell Leigh, MD  tirzepatide [facility]) 7.5 MG/0.5ML Pen Inject  7.5 mg into the skin once a week. 03/05/24   Motwani, Komal, MD  torsemide  (DEMADEX ) 100 MG tablet Take 1 tablet (100 mg total) by mouth 2 (two) times daily. 02/26/24   Clegg, [name] D, NP  triamcinolone  ointment (KENALOG ) 0.1 % Apply topically 2 (two) times daily. 08/09/23   Newlin, [name], MD  VITAMIN E PO Take 1 tablet by mouth daily.    [provider]  tiotropium (SPIRIVA  HANDIHALER) 18 MCG inhalation capsule Place 1 capsule (18 mcg total) into inhaler and inhale every morning. Patient not taking: Reported on 02/09/2021 12/09/19 02/09/21  Armenta Landau, MD     Physical Exam: Vitals:   03/08/24 2145 03/08/24 2200 03/08/24 2205 03/08/24 2210  BP: 102/81 110/62 119/66 (!) 116/52  Pulse: 92 88 92 81  Resp: 18 17 18 16   Temp: (!) 97.4 F (36.3 C) 98 F (36.7 C) 98 F (36.7 C) 98 F (36.7 C)  TempSrc: Axillary Axillary Axillary Axillary  SpO2: 100% 100% 100% 100%    Physical Exam Vitals and nursing note reviewed.  Constitutional:      Appearance: She is obese. She is not ill-appearing.  Cardiovascular:  Rate and Rhythm: Normal rate and regular rhythm.     Heart sounds: Normal heart sounds.  Pulmonary:     Effort: Pulmonary effort is normal.     Breath sounds: Normal breath sounds.  Abdominal:     Palpations: Abdomen is soft. There is no mass.     Tenderness: There is no abdominal tenderness.  Musculoskeletal:     Cervical back: Neck supple.     Right lower leg: No edema.     Left lower leg: No edema.  Skin:    General: Skin is dry.     Capillary Refill: Capillary refill takes less than 2 seconds.  Neurological:     Mental Status: She is alert and oriented to person, place, and time.  Psychiatric:        Mood and Affect: Mood normal. Mood is not anxious.        Behavior: Behavior normal.      Labs on Admission: I have personally reviewed following labs and imaging studies  CBC: Recent Labs  Lab 03/08/24 1828  WBC 12.3*  NEUTROABS 9.5*  HGB 5.3*  HCT  17.9*  MCV 105.9*  PLT 218   Basic Metabolic Panel: Recent Labs  Lab 03/08/24 1828  NA 138  K 4.2  CL 99  CO2 25  GLUCOSE 220*  BUN 93*  CREATININE 2.32*  CALCIUM  9.2   GFR: Estimated Creatinine Clearance: 28.4 mL/min (A) (by C-G formula based on SCr of 2.32 mg/dL (H)). Liver Function Tests: Recent Labs  Lab 03/08/24 1828  AST 19  ALT 13  ALKPHOS 66  BILITOT 0.5  PROT 6.3*  ALBUMIN  3.0*   No results for input(s): "LIPASE", "AMYLASE" in the last 168 hours. No results for input(s): "AMMONIA" in the last 168 hours. Coagulation Profile: No results for input(s): "INR", "PROTIME" in the last 168 hours. Cardiac Enzymes: Recent Labs  Lab 03/08/24 1828 03/08/24 1936  TROPONINIHS 94* 106*   BNP (last 3 results) Recent Labs    02/08/24 1215 02/29/24 1144 03/08/24 1828  BNP 136.6* 303.1* 666.1*   HbA1C: No results for input(s): "HGBA1C" in the last 72 hours. CBG: No results for input(s): "GLUCAP" in the last 168 hours. Lipid Profile: No results for input(s): "CHOL", "HDL", "LDLCALC", "TRIG", "CHOLHDL", "LDLDIRECT" in the last 72 hours. Thyroid  Function Tests: No results for input(s): "TSH", "T4TOTAL", "FREET4", "T3FREE", "THYROIDAB" in the last 72 hours. Anemia Panel: No results for input(s): "VITAMINB12", "FOLATE", "FERRITIN", "TIBC", "IRON ", "RETICCTPCT" in the last 72 hours. Urine analysis:    Component Value Date/Time   COLORURINE STRAW (A) 01/03/2024 2026   APPEARANCEUR CLEAR 01/03/2024 2026   LABSPEC 1.009 01/03/2024 2026   PHURINE 5.0 01/03/2024 2026   GLUCOSEU >=500 (A) 01/03/2024 2026   HGBUR NEGATIVE 01/03/2024 2026   BILIRUBINUR NEGATIVE 01/03/2024 2026   KETONESUR NEGATIVE 01/03/2024 2026   PROTEINUR NEGATIVE 01/03/2024 2026   UROBILINOGEN 0.2 09/05/2015 0012   NITRITE NEGATIVE 01/03/2024 2026   LEUKOCYTESUR NEGATIVE 01/03/2024 2026    Radiological Exams on Admission: I have personally reviewed images DG Chest Port 1 View Result Date:  03/08/2024 CLINICAL DATA:  chest pain EXAM: PORTABLE CHEST 1 VIEW COMPARISON:  Chest x-ray 01/03/2024, CT chest 02/14/2023 FINDINGS: The heart and mediastinal contours are unchanged. Atherosclerotic plaque. Bibasilar atelectasis. No focal consolidation. No pulmonary edema. No pleural effusion. No pneumothorax. No acute osseous abnormality. IMPRESSION: 1. No active disease. 2.  Aortic Atherosclerosis (ICD10-I70.0). Electronically Signed   By: Morgane  Naveau M.D.   On: 03/08/2024  18:48     EKG: My personal interpretation of EKG shows: EKG showing atrial fibrillation heart rate 99.    Assessment/Plan: Principal Problem:   GI bleed Active Problems:   Elevated troponin   History of CAD (coronary artery disease)   Chest pain   Paroxysmal atrial fibrillation ([name])   S/P peripheral artery angioplasty - TurboHawk atherectomy; R SFA   Symptomatic anemia   Insulin  dependent type 2 diabetes mellitus ([name])   Peripheral artery disease ([name])   Carotid artery stenosis   COPD (chronic obstructive pulmonary disease) ([name])   Hyperlipidemia   Hypothyroidism   Chronic sinus bradycardia   HFrEF (heart failure with reduced ejection fraction) ([name])   CKD (chronic kidney disease), stage IV ([name])   Continuous dependence on cigarette smoking   GAD (generalized anxiety disorder)    Assessment and Plan: GI bleed Symptomatic anemia -Present emergency department complaining of chest pain, short of breath and complaining about dark red stool for last few months. - Presentation to ED patient is hypotensive blood pressure 110/41.  Otherwise hemodynamically stable. - Physical exam showed melena colitis stool in the rectal vault. - Hemoglobin dropped to 5.3.  Baseline hemoglobin around 10-11. - In the ED 2 unit blood transfusion has been ordered. - [facility] Dr. Nickey Barn has been consulted recommended to keep patient n.p.o. and will see patient in the a.m. for consult. - In the ED patient received IV Protonix  80  mg. - Continue IV Protonix  40 mg twice daily. - Continue to trend H&H and transfuse as needed goal to keep hemoglobin above 8 given patient has history of CAD, PAD. -Continue clear liquid diet and and will keep patient n.p.o. after midnight.  Elevated troponin and chest pain secondary to demand ischemia-Patient is complaining about chest pain for 2 to 3 days with associated shortness of breath.  Chest pain radiates to the neck. - Troponin peaked at 94>>>106. - Concern for chest pain and elevated troponin in setting of demand ischemia from underlying GI bleed. -EKG showed atrial fibrillation.  There is no ST anterior abnormality. -Discussed case with on-call cardiology.  Per cardiology chest pain and elevated troponin in setting of demand ischemia from GI bleed.  Recommended even after repletion of hemoglobin and improvement of RBC count patient still continues to have chest pain in that case need to pursue further cardiac workup.  History of CAD -Continue Toprol -XL and Crestor .  Eliquis  on hold in the setting of GI bleed.  Paroxysmal atrial fibrillation -Holding Eliquis  in the setting of GI bleed. Continue Toprol -XL.  Essential hypertension Combined systolic and diastolic heart failure reduced EF 45 to 50% -Hold blood pressure regimen in the setting of hypotension except continue Toprol -XL. Per chart review due to advanced CKD limiting GDMT guided medication and due to recurrent history of UTI limiting use of Jardiance .   History of peripheral artery disease status post angioplasty History of carotid artery stenosis s/p endarterectomy -Continue Crestor .  Holding Eliquis  in the setting of GI bleed  COPD -Stable.  Continue Proventil  as needed and Dulera  twice daily.  Hyperlipidemia -Continue Crestor .  Hypothyroidism -Continue levothyroxine .   CKD stage IV -Creatinine 2.32.  GFR 23.  Renal function at baseline.  Continue to monitor renal function and avoid nephrotoxic agent.   Monitor urine output.  Continuous smoking cigarette In the setting of elevated troponin/demand ischemia and chest pain HypRho-D nicotine  patch.  Insulin -dependent DM type II - At home patient takes Lantus  57 unit in the nighttime and lispro 20 unit 3  times daily with meals.  Given patient will be n.p.o. after midnight plan to continue long-acting insulin  decreasing the dose to 25%.  Continue long-acting insulin  35 unit at bedtime and holding short-acting insulin . -Continue to check POC blood glucose every 6 hours.  Chronic muscle spasm - Patient reported intolerance to gabapentin  due to tremor. - Starting Robaxin  3 times daily as needed  Gout - Continue allopurinol   DVT prophylaxis:  SCDs Code Status:  Full Code Diet: Clear liquid diet.  Will n.p.o. after midnight Family Communication: Currently no family member present at bedside. Disposition Plan: Continue to monitor H&H and transfuse as needed. Consults: [facility] Dr. Nickey Barn Admission status:   Inpatient, Step Down Unit  Severity of Illness: The appropriate patient status for this patient is INPATIENT. Inpatient status is judged to be reasonable and necessary in order to provide the required intensity of service to ensure the patient's safety. The patient's presenting symptoms, physical exam findings, and initial radiographic and laboratory data in the context of their chronic comorbidities is felt to place them at high risk for further clinical deterioration. Furthermore, it is not anticipated that the patient will be medically stable for discharge from the hospital within 2 midnights of admission.   * I certify that at the point of admission it is my clinical judgment that the patient will require inpatient hospital care spanning beyond 2 midnights from the point of admission due to high intensity of service, high risk for further deterioration and high frequency of surveillance required.Aaron Aas    Francoise Chojnowski,  MD [facility]  How to contact the [facility] Attending or Consulting provider 7A - 7P or covering provider during after hours 7P -7A, for this patient.  Check the care team in [facility] and look for a) attending/consulting [facility] provider listed and b) the [facility] team listed Log into [email or web address] and use [facility] universal password to access. If you do not have the password, please contact the hospital operator. Locate the [facility] provider you are looking for under [facility] and page to a number that you can be directly reached. If you still have difficulty reaching the provider, please page the [facility] (Director on Call) for the Hospitalists listed on amion for assistance.  03/08/2024, 10:12 PM

## 2024-03-08 NOTE — Telephone Encounter (Signed)
 Pt aware Reports she feels better at the moment however will report to Er if symptoms return

## 2024-03-08 NOTE — Telephone Encounter (Signed)
 Patient spoke with Jenna LCSW reported concerns 9 out of 10 chest pain going on 2 days, shaking, and swelling- she believes it is related to new medication we gave her hydralazine -   Returned call to patient at [phone] Reports chest pain and arm pain x 2  for the past two days Reports symptoms are only at night Feels symptoms are related to new medication (hydralazine ) Reports she feels like she swells like a balloon  Reports Mild SOB and shaking all over ?related to lyrica   Denies dizziness Reports tylenol  makes CP better Reports symptoms only start with evening dose of hydralazine    Please advise

## 2024-03-09 DIAGNOSIS — I2489 Other forms of acute ischemic heart disease: Secondary | ICD-10-CM

## 2024-03-09 DIAGNOSIS — R079 Chest pain, unspecified: Secondary | ICD-10-CM | POA: Diagnosis not present

## 2024-03-09 DIAGNOSIS — K922 Gastrointestinal hemorrhage, unspecified: Secondary | ICD-10-CM

## 2024-03-09 DIAGNOSIS — I480 Paroxysmal atrial fibrillation: Secondary | ICD-10-CM | POA: Diagnosis not present

## 2024-03-09 LAB — HEMOGLOBIN AND HEMATOCRIT, BLOOD
HCT: 20 % — ABNORMAL LOW (ref 36.0–46.0)
HCT: 23.1 % — ABNORMAL LOW (ref 36.0–46.0)
HCT: 23.2 % — ABNORMAL LOW (ref 36.0–46.0)
Hemoglobin: 6.2 g/dL — CL (ref 12.0–15.0)
Hemoglobin: 7.6 g/dL — ABNORMAL LOW (ref 12.0–15.0)
Hemoglobin: 7.5 g/dL — ABNORMAL LOW (ref 12.0–15.0)

## 2024-03-09 LAB — COMPREHENSIVE METABOLIC PANEL WITH GFR
ALT: 11 U/L (ref 0–44)
AST: 20 U/L (ref 15–41)
Albumin: 2.6 g/dL — ABNORMAL LOW (ref 3.5–5.0)
Alkaline Phosphatase: 54 U/L (ref 38–126)
Anion gap: 9 (ref 5–15)
BUN: 93 mg/dL — ABNORMAL HIGH (ref 8–23)
CO2: 29 mmol/L (ref 22–32)
Calcium: 8.9 mg/dL (ref 8.9–10.3)
Chloride: 103 mmol/L (ref 98–111)
Creatinine, Ser: 2.31 mg/dL — ABNORMAL HIGH (ref 0.44–1.00)
GFR, Estimated: 23 mL/min — ABNORMAL LOW (ref >60)
Glucose, Bld: 177 mg/dL — ABNORMAL HIGH (ref 70–99)
Potassium: 4.2 mmol/L (ref 3.5–5.1)
Sodium: 141 mmol/L (ref 135–145)
Total Bilirubin: 0.7 mg/dL (ref 0.0–1.2)
Total Protein: 5.3 g/dL — ABNORMAL LOW (ref 6.5–8.1)

## 2024-03-09 LAB — GLUCOSE, CAPILLARY
Glucose-Capillary: 194 mg/dL — ABNORMAL HIGH (ref 70–99)
Glucose-Capillary: 154 mg/dL — ABNORMAL HIGH (ref 70–99)
Glucose-Capillary: 150 mg/dL — ABNORMAL HIGH (ref 70–99)

## 2024-03-09 LAB — CBG MONITORING, ED: Glucose-Capillary: 154 mg/dL — ABNORMAL HIGH (ref 70–99)

## 2024-03-09 LAB — CBC
HCT: 23.1 % — ABNORMAL LOW (ref 36.0–46.0)
Hemoglobin: 7.4 g/dL — ABNORMAL LOW (ref 12.0–15.0)
MCH: 31.2 pg (ref 26.0–34.0)
MCHC: 32 g/dL (ref 30.0–36.0)
MCV: 97.5 fL (ref 80.0–100.0)
Platelets: 222 10*3/uL (ref 150–400)
RBC: 2.37 MIL/uL — ABNORMAL LOW (ref 3.87–5.11)
RDW: 18.1 % — ABNORMAL HIGH (ref 11.5–15.5)
WBC: 11.1 10*3/uL — ABNORMAL HIGH (ref 4.0–10.5)
nRBC: 0.3 % — ABNORMAL HIGH (ref 0.0–0.2)

## 2024-03-09 LAB — APTT: aPTT: 26 s (ref 24–36)

## 2024-03-09 LAB — PROTIME-INR
INR: 1.3 — ABNORMAL HIGH (ref 0.8–1.2)
Prothrombin Time: 16.1 s — ABNORMAL HIGH (ref 11.4–15.2)

## 2024-03-09 MED ORDER — SODIUM CHLORIDE 0.9 % IV SOLN: INTRAVENOUS | Status: DC

## 2024-03-09 MED ORDER — MELATONIN 3 MG PO TABS
3.0000 mg | ORAL_TABLET | Freq: Every evening | ORAL | Status: DC | PRN
  Administered 2024-03-09 – 2024-03-17 (×9): 3 mg via ORAL
  Filled 2024-03-09 (×9): qty 1

## 2024-03-09 MED ORDER — OXYCODONE HCL 5 MG PO TABS
5.0000 mg | ORAL_TABLET | Freq: Four times a day (QID) | ORAL | Status: DC | PRN
  Administered 2024-03-09 (×3): 5 mg via ORAL
  Filled 2024-03-09 (×3): qty 1

## 2024-03-09 MED ORDER — OXYCODONE HCL ER 10 MG PO T12A: 10.0000 mg | EXTENDED_RELEASE_TABLET | Freq: Once | ORAL | Status: DC

## 2024-03-09 MED ORDER — INSULIN ASPART 100 UNIT/ML IJ SOLN
0.0000 [IU] | Freq: Every day | INTRAMUSCULAR | Status: DC
  Administered 2024-03-11: 2 [IU] via SUBCUTANEOUS
  Administered 2024-03-12: 4 [IU] via SUBCUTANEOUS
  Administered 2024-03-13: 3 [IU] via SUBCUTANEOUS
  Administered 2024-03-15 – 2024-03-16 (×2): 2 [IU] via SUBCUTANEOUS

## 2024-03-09 MED ORDER — INSULIN ASPART 100 UNIT/ML IJ SOLN
0.0000 [IU] | Freq: Three times a day (TID) | INTRAMUSCULAR | Status: DC
  Administered 2024-03-09 (×2): 2 [IU] via SUBCUTANEOUS
  Administered 2024-03-10: 5 [IU] via SUBCUTANEOUS
  Administered 2024-03-11: 1 [IU] via SUBCUTANEOUS
  Administered 2024-03-11: 2 [IU] via SUBCUTANEOUS
  Administered 2024-03-12: 5 [IU] via SUBCUTANEOUS
  Administered 2024-03-12: 2 [IU] via SUBCUTANEOUS
  Administered 2024-03-12: 3 [IU] via SUBCUTANEOUS
  Administered 2024-03-13: 2 [IU] via SUBCUTANEOUS
  Administered 2024-03-13: 3 [IU] via SUBCUTANEOUS
  Administered 2024-03-13 – 2024-03-14 (×2): 7 [IU] via SUBCUTANEOUS
  Administered 2024-03-14 – 2024-03-15 (×2): 3 [IU] via SUBCUTANEOUS
  Administered 2024-03-15: 2 [IU] via SUBCUTANEOUS
  Administered 2024-03-15: 5 [IU] via SUBCUTANEOUS
  Administered 2024-03-16 (×3): 3 [IU] via SUBCUTANEOUS
  Administered 2024-03-17: 5 [IU] via SUBCUTANEOUS

## 2024-03-09 MED ORDER — SODIUM CHLORIDE 0.9 % IV SOLN
INTRAVENOUS | Status: DC
  Administered 2024-03-10: 75 mL via INTRAVENOUS

## 2024-03-09 NOTE — Hospital Course (Addendum)
 Ms. Baeza is a 63 yo female with PMH CAD, HFrEF, dCHF, CAD, CKDIV, carotid stenosis s/p endarterectomy, PAF, PAD, COPD, ongoing tobacco use, chronic bradycardia, gout, HTN, DM II, HLD, vitamin D  deficiency who presented with chest pain complaints.  She was given nitroglycerin  by EMS.  She also was complaining of dark stools.  She states she does take BC powder approximately 1-2 times per month but denied any more frequent.  ED Course:  At presentation to ED patient is borderline hypotensive blood pressure 110/41, tachycardic 99-108.  O2 sat 100% room air. Troponin 94>>106 CBC showing low hemoglobin 5.3 (baseline hemoglobin around 10-11.  Elevated WBC count 12.3 for last 2 months.  Normal platelet count. CMP showing creatinine 2.32 and GFR 23.  Renal function at baseline.  Otherwise unremarkable CMP. Elevated BNP 666. EKG showing atrial fibrillation heart rate 99, left ventricular hypertrophy pattern. Chest ray showing no acute disease process.  Aortic atherosclerosis.   Dr.Yao from bedside rectal exam which showed patient has melena colicky stool in the rectal vault.   In the ED 2 unit blood transfusion has been ordered.  And patient received IV Protonix  80 mg.  She was admitted for further GI bleed workup.

## 2024-03-09 NOTE — Assessment & Plan Note (Signed)
-   patient has history of CKD4. Baseline creat ~ 2.2, eGFR~ 25

## 2024-03-09 NOTE — Assessment & Plan Note (Signed)
 -  Continue Wellbutrin

## 2024-03-09 NOTE — Assessment & Plan Note (Signed)
-   No signs/symptoms of exacerbation 

## 2024-03-09 NOTE — Assessment & Plan Note (Signed)
-   see GIB 

## 2024-03-09 NOTE — Assessment & Plan Note (Signed)
-   see demand ischemia - currently CP free

## 2024-03-09 NOTE — Assessment & Plan Note (Signed)
-   Eliquis  on hold - Continue statin, Toprol

## 2024-03-09 NOTE — Assessment & Plan Note (Signed)
-   Eliquis  on hold for now -Continue Toprol  - Started on amiodarone  drip overnight due to reverting to A-fib with RVR; spontaneously converted afterwards also and back in normal sinus rhythm this morning - Cardiology managing amiodarone

## 2024-03-09 NOTE — Progress Notes (Signed)
 Progress Note    [name]   ZOX:[id number]  DOB: 1961/06/06  DOA: 03/08/2024     1 PCP: Maryellen Snare, NP  Initial CC: dark stool, chest pain  Hospital Course: [name] is a 63 yo female with PMH CAD, HFrEF, dCHF, CAD, CKDIV, carotid stenosis s/p endarterectomy, PAF, PAD, COPD, ongoing tobacco use, chronic bradycardia, gout, HTN, DM II, HLD, vitamin D  deficiency who presented with chest pain complaints.  She was given nitroglycerin  by EMS.  She also was complaining of dark stools.  She states she does take BC powder approximately 1-2 times per month but denied any more frequent.  ED Course:  At presentation to ED patient is borderline hypotensive blood pressure 110/41, tachycardic 99-108.  O2 sat 100% room air. Troponin 94>>106 CBC showing low hemoglobin 5.3 (baseline hemoglobin around 10-11.  Elevated WBC count 12.3 for last 2 months.  Normal platelet count. CMP showing creatinine 2.32 and GFR 23.  Renal function at baseline.  Otherwise unremarkable CMP. Elevated BNP 666. EKG showing atrial fibrillation heart rate 99, left ventricular hypertrophy pattern. Chest ray showing no acute disease process.  Aortic atherosclerosis.   Dr.Yao from bedside rectal exam which showed patient has melena colicky stool in the rectal vault.   In the ED 2 unit blood transfusion has been ordered.  And patient received IV Protonix  80 mg.  She was admitted for further GI bleed workup.  Interval History:  No further chest pain when seen this morning.  Resting comfortably in bed.  Confirms her stools have been dark prior to admission and denies any bright red stools.  Assessment and Plan: * GI bleed - Potential for upper GIB given eliquis  use and intermittent use of BC powder -Initial hemoglobin 5.3 g/dL with baseline around 11 g/dL - She received 2 units PRBC on admission and repeat hemoglobin this morning, 7.6 g/dL - GI consulted on admission for potential EGD, follow up evaluation - continue  trending H/H and will transfuse further if necessary - continue protonix    Paroxysmal atrial fibrillation ([name]) - Eliquis  on hold for now -Continue Toprol   Demand ischemia ([name]) - Presented with chest pain complaints and some shortness of breath - Troponins elevated some on admission 94>>106 - EKG noted with afib but no alarming signs of ischemia - Discussed case with on-call cardiology.  Per cardiology chest pain and elevated troponin in setting of demand ischemia from GI bleed.  If chest pain recurs or worsens after correction of anemia, further workup could then be considered  CKD (chronic kidney disease), stage IV ([name]) - patient has history of CKD4. Baseline creat ~ 2.2, eGFR~ 25   Chest pain-resolved as of 03/09/2024 - see demand ischemia - currently CP free  HFrEF (heart failure with reduced ejection fraction) ([name]) - no s/s exacerbation  - Last echo reviewed from 09/27/2019 4: EF 45 to 50%, RWMA, mild LVH, grade 2 DD  Symptomatic anemia - see GIB  Insulin  dependent type 2 diabetes mellitus ([name]) - Last A1c 12.6% on 01/23/2024 - Continue on SSI and CBG monitoring for now  Peripheral artery disease ([name]) - Eliquis  on hold - Continue Crestor   COPD (chronic obstructive pulmonary disease) ([name]) - No signs/symptoms of exacerbation  Hyperlipidemia - Continue Crestor   Hypothyroidism - Continue Synthroid   GAD (generalized anxiety disorder) - Continue Wellbutrin   History of CAD (coronary artery disease) - Eliquis  on hold - Continue statin, Toprol    Old records reviewed in assessment of this patient  Antimicrobials:   DVT prophylaxis:  SCDs Start: 03/08/24 2023 Place and maintain sequential compression device Start: 03/08/24 2023 Place TED hose Start: 03/08/24 2023   Code Status:   Code Status: Full Code  Mobility Assessment (Last 72 Hours)     Mobility Assessment   No documentation.           Barriers to discharge: none Disposition Plan:   Home HH orders placed: n/a Status is: Inpt  Objective: Blood pressure (!) 131/93, pulse 79, temperature 97.7 F (36.5 C), temperature source Oral, resp. rate 18, height 5\' 3"  (1.6 m), weight 97.8 kg, SpO2 97%.  Examination:  Physical Exam Constitutional:      Appearance: Normal appearance.  HENT:     Head: Normocephalic and atraumatic.     Mouth/Throat:     Mouth: Mucous membranes are moist.  Eyes:     Extraocular Movements: Extraocular movements intact.  Cardiovascular:     Rate and Rhythm: Normal rate and regular rhythm.  Pulmonary:     Effort: Pulmonary effort is normal. No respiratory distress.     Breath sounds: Normal breath sounds. No wheezing.  Abdominal:     General: Bowel sounds are normal. There is no distension.     Palpations: Abdomen is soft.     Tenderness: There is no abdominal tenderness.  Musculoskeletal:        General: Normal range of motion.     Cervical back: Normal range of motion and neck supple.  Skin:    General: Skin is warm and dry.  Neurological:     General: No focal deficit present.     Mental Status: She is alert.  Psychiatric:        Mood and Affect: Mood normal.      Consultants:  GI  Procedures:    Data Reviewed: Results for orders placed or performed during the hospital encounter of 03/08/24 (from the past 24 hours)  CBC with Differential     Status: Abnormal   Collection Time: 03/08/24  6:28 PM  Result Value Ref Range   WBC 12.3 (H) 4.0 - 10.5 K/uL   RBC 1.69 (L) 3.87 - 5.11 MIL/uL   Hemoglobin 5.3 (LL) 12.0 - 15.0 g/dL   HCT 16.1 (L) 09.6 - 04.5 %   MCV 105.9 (H) 80.0 - 100.0 fL   MCH 31.4 26.0 - 34.0 pg   MCHC 29.6 (L) 30.0 - 36.0 g/dL   RDW 40.9 (H) 81.1 - 91.4 %   Platelets 218 150 - 400 K/uL   nRBC 0.2 0.0 - 0.2 %   Neutrophils Relative % 77 %   Neutro Abs 9.5 (H) 1.7 - 7.7 K/uL   Lymphocytes Relative 13 %   Lymphs Abs 1.6 0.7 - 4.0 K/uL   Monocytes Relative 7 %   Monocytes Absolute 0.9 0.1 - 1.0 K/uL    Eosinophils Relative 2 %   Eosinophils Absolute 0.2 0.0 - 0.5 K/uL   Basophils Relative 0 %   Basophils Absolute 0.0 0.0 - 0.1 K/uL   Immature Granulocytes 1 %   Abs Immature Granulocytes 0.14 (H) 0.00 - 0.07 K/uL  Comprehensive metabolic panel     Status: Abnormal   Collection Time: 03/08/24  6:28 PM  Result Value Ref Range   Sodium 138 135 - 145 mmol/L   Potassium 4.2 3.5 - 5.1 mmol/L   Chloride 99 98 - 111 mmol/L   CO2 25 22 - 32 mmol/L   Glucose, Bld 220 (H) 70 - 99 mg/dL   BUN 93 (H)  8 - 23 mg/dL   Creatinine, Ser 1.61 (H) 0.44 - 1.00 mg/dL   Calcium  9.2 8.9 - 10.3 mg/dL   Total Protein 6.3 (L) 6.5 - 8.1 g/dL   Albumin  3.0 (L) 3.5 - 5.0 g/dL   AST 19 15 - 41 U/L   ALT 13 0 - 44 U/L   Alkaline Phosphatase 66 38 - 126 U/L   Total Bilirubin 0.5 0.0 - 1.2 mg/dL   GFR, Estimated 23 (L) >60 mL/min   Anion gap 14 5 - 15  Troponin I (High Sensitivity)     Status: Abnormal   Collection Time: 03/08/24  6:28 PM  Result Value Ref Range   Troponin I (High Sensitivity) 94 (H) <18 ng/L  Brain natriuretic peptide     Status: Abnormal   Collection Time: 03/08/24  6:28 PM  Result Value Ref Range   B Natriuretic Peptide 666.1 (H) 0.0 - 100.0 pg/mL  POC occult blood, ED     Status: Abnormal   Collection Time: 03/08/24  7:23 PM  Result Value Ref Range   Fecal Occult Bld POSITIVE (A) NEGATIVE  Troponin I (High Sensitivity)     Status: Abnormal   Collection Time: 03/08/24  7:36 PM  Result Value Ref Range   Troponin I (High Sensitivity) 106 (HH) <18 ng/L  Type and screen     Status: None (Preliminary result)   Collection Time: 03/08/24  7:36 PM  Result Value Ref Range   ABO/RH(D) A POS    Antibody Screen NEG    Sample Expiration 03/11/2024,2359    Unit Number [id number]    Blood Component Type RED CELLS,LR    Unit division 00    Status of Unit ALLOCATED    Transfusion Status OK TO TRANSFUSE    Crossmatch Result Compatible    Unit Number [id number]    Blood Component Type RBC  LR PHER1    Unit division 00    Status of Unit ISSUED    Transfusion Status OK TO TRANSFUSE    Crossmatch Result Compatible    Unit Number [id number]    Blood Component Type RED CELLS,LR    Unit division 00    Status of Unit ISSUED,FINAL    Transfusion Status OK TO TRANSFUSE    Crossmatch Result      Compatible Performed at [facility], [address], [place], [place] [address]   Prepare RBC (crossmatch)     Status: None   Collection Time: 03/08/24  8:23 PM  Result Value Ref Range   Order Confirmation      ORDER PROCESSED BY BLOOD BANK Performed at [facility], [address], [place], [place] [address]   CBG monitoring, ED     Status: Abnormal   Collection Time: 03/08/24 11:50 PM  Result Value Ref Range   Glucose-Capillary 195 (H) 70 - 99 mg/dL  Hemoglobin and hematocrit, blood     Status: Abnormal   Collection Time: 03/09/24  2:09 AM  Result Value Ref Range   Hemoglobin 6.2 (LL) 12.0 - 15.0 g/dL   HCT 72.5 (L) 36.6 - 44.0 %  CBC     Status: Abnormal   Collection Time: 03/09/24  5:14 AM  Result Value Ref Range   WBC 11.1 (H) 4.0 - 10.5 K/uL   RBC 2.37 (L) 3.87 - 5.11 MIL/uL   Hemoglobin 7.4 (L) 12.0 - 15.0 g/dL   HCT 34.7 (L) 42.5 - 95.6 %   MCV 97.5 80.0 - 100.0 fL  MCH 31.2 26.0 - 34.0 pg   MCHC 32.0 30.0 - 36.0 g/dL   RDW 21.3 (H) 08.6 - 57.8 %   Platelets 222 150 - 400 K/uL   nRBC 0.3 (H) 0.0 - 0.2 %  Comprehensive metabolic panel     Status: Abnormal   Collection Time: 03/09/24  5:14 AM  Result Value Ref Range   Sodium 141 135 - 145 mmol/L   Potassium 4.2 3.5 - 5.1 mmol/L   Chloride 103 98 - 111 mmol/L   CO2 29 22 - 32 mmol/L   Glucose, Bld 177 (H) 70 - 99 mg/dL   BUN 93 (H) 8 - 23 mg/dL   Creatinine, Ser 4.69 (H) 0.44 - 1.00 mg/dL   Calcium  8.9 8.9 - 10.3 mg/dL   Total Protein 5.3 (L) 6.5 - 8.1 g/dL   Albumin  2.6 (L) 3.5 - 5.0 g/dL   AST 20 15 - 41 U/L   ALT 11 0 - 44 U/L   Alkaline Phosphatase 54 38 - 126 U/L   Total Bilirubin 0.7  0.0 - 1.2 mg/dL   GFR, Estimated 23 (L) >60 mL/min   Anion gap 9 5 - 15  Protime-INR     Status: Abnormal   Collection Time: 03/09/24  5:14 AM  Result Value Ref Range   Prothrombin Time 16.1 (H) 11.4 - 15.2 seconds   INR 1.3 (H) 0.8 - 1.2  APTT     Status: None   Collection Time: 03/09/24  5:14 AM  Result Value Ref Range   aPTT 26 24 - 36 seconds  CBG monitoring, ED     Status: Abnormal   Collection Time: 03/09/24 10:36 AM  Result Value Ref Range   Glucose-Capillary 154 (H) 70 - 99 mg/dL  Hemoglobin and hematocrit, blood     Status: Abnormal   Collection Time: 03/09/24 12:38 PM  Result Value Ref Range   Hemoglobin 7.6 (L) 12.0 - 15.0 g/dL   HCT 62.9 (L) 52.8 - 41.3 %   *Note: Due to a large number of results and/or encounters for the requested time period, some results have not been displayed. A complete set of results can be found in Results Review.    I have reviewed pertinent nursing notes, vitals, labs, and images as necessary. I have ordered labwork to follow up on as indicated.  I have reviewed the last notes from staff over past 24 hours. I have discussed patient's care plan and test results with nursing staff, CM/SW, and other staff as appropriate.  Time spent: Greater than 50% of the 55 minute visit was spent in counseling/coordination of care for the patient as laid out in the A&P.   LOS: 1 day   Faith Homes, MD [facility] 03/09/2024, 1:47 PM

## 2024-03-09 NOTE — Assessment & Plan Note (Addendum)
-   Potential for upper GIB given eliquis  use and intermittent use of BC powder -Initial hemoglobin 5.3 g/dL with baseline around 11 g/dL - She received 2 units PRBC on admission and repeat hemoglobin this morning, 7.6 g/dL - GI consulted on admission for potential EGD, follow up evaluation - continue trending H/H and will transfuse further if necessary - continue protonix

## 2024-03-09 NOTE — Assessment & Plan Note (Signed)
-   no s/s exacerbation  - Last echo reviewed from 09/27/2023: EF 45 to 50%, RWMA, mild LVH, grade 2 DD - New echo this admission now showing further decreased EF, 35 to 40%, grade 1 DD, global hypokinesis -Follow-up cardiology evaluation

## 2024-03-09 NOTE — Assessment & Plan Note (Signed)
-   Last A1c 12.6% on 01/23/2024 - Continue on SSI and CBG monitoring for now

## 2024-03-09 NOTE — Assessment & Plan Note (Signed)
 Continue Synthroid

## 2024-03-09 NOTE — Consult Note (Signed)
 Reason for Consult: Symptomatic anemia Referring Physician: [facility]  [name] HPI: This is a 63 year old female with a PMH of CHF, CKD, COPD, ETOH abuse, DM, and CAD admitted for complaints of chest pain.  The pain started 2-3 days before her admission and there was radiation to her neck.  EMS brought her to the ER and further evaluation showed that her HGB was at 5.3 g/dL.  She denied any issues with melena, hematochezia, or abdominal pain, but she does report a history of GERD and intermittent dysphagia.  Her GERD was remedied with using TUMS and Rolaids.  She does not report using an PPI in the past.  Her dysphagia started 6 months ago, but she does not report any weight loss.  Per her report, she feels that one of her cardiac meds induced the dysphagia symptoms.  The patient does not recall having any history with PUD in the past and she does not report having any endoscopic evaluation.  With blood transfusions her HGB incrased up to 7.6 g/dL and she reports feeling better.    Past Medical History:  Diagnosis Date   Acute renal failure ([name])    Acute respiratory failure with hypoxia ([name]) 12/02/2019   Alcohol abuse    Alcoholic cirrhosis ([name])    Anemia    Anxiety    Arthritis    "knees" (11/26/2018)   B12 deficiency    CAD (coronary artery disease)    a. 11/10/2014 Cath: LM nl, LAD min irregs, D1 30 ost, D2 50d, LCX 51m, OM1 80 p/m (1.5 mm vessel), OM2 1m, RCA nondom 31m-->med rx.. Demand ischemia in the setting of rapid a-fib.   Cardiomyopathy ([name])    Carotid artery disease ([name])    a. 01/2015 Carotid Angio: RICA 100, LICA 95p; b. 02/2015 s/p L CEA; c. 05/2019 Carotid U/S: RICA 100. RECA >50. LICA 1-39%.   Cellulitis    lower extremities   Cellulitis in diabetic foot ([name]) 07/08/2019   CHF (congestive heart failure) ([name])    Chronic combined systolic and diastolic CHF (congestive heart failure) ([name])    a. 10/2014 Echo: EF 40-45%; b. 10/2018 Echo: EF 45-50%, gr2 DD; c.  11/2019 Echo: EF 50%, mild LVH, gr2 DD (restrictive), antlat HK, Nl RV fxn. Mild BAE. RVSP 59.51mmHg.   CKD (chronic kidney disease), stage III ([name])    Cocaine abuse ([name])    COPD (chronic obstructive pulmonary disease) ([name])    Critical ischemia of foot ([name]) 06/07/2021   Critical limb ischemia     Critical lower limb ischemia ([name])    Demand ischemia ([name]) 10/29/2014   Depression    Diabetes mellitus without complication ([name])    Diabetic peripheral neuropathy ([name])    DVT (deep venous thrombosis) ([name])    Dyspnea    Elevated troponin    a. Chronic elevation.   Fall 05/05/2021   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[name])    Peripheral arterial disease/critical limb ischemia     GERD (gastroesophageal reflux disease)    Hyperlipemia    Hypertension    Hypokalemia    Hypomagnesemia    Hypothyroidism    Marijuana abuse    Narcotic abuse ([name])    Noncompliance    NSVT (nonsustained ventricular tachycardia) ([name])    Obesity    Osteomyelitis ([name]) 06/21/2019   PAF (paroxysmal atrial fibrillation) ([name])    Paroxysmal atrial tachycardia ([name])    Peripheral arterial disease ([name])    a. 01/2015 Angio/PTA: RSFA 99 (atherectomy/pta) -  1 vessel runoff via diff dzs peroneal; b. 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   Pneumonia    "once or twice" (11/26/2018)   Poorly controlled type 2 diabetes mellitus ([name])    Renal disorder    Renal insufficiency    a. 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   SIRS (systemic inflammatory response syndrome) ([name]) 04/06/2017   Sleep apnea    "couldn't handle wearing the mask" (11/26/2018)   Symptomatic bradycardia    a. Avoid AV blocking agent per EP. Prev req temp wire in 2017.   Tobacco abuse    Wrist fracture, left, closed, initial encounter 01/29/2015    Past Surgical History:  Procedure Laterality Date   ABDOMINAL AORTOGRAM N/A 06/26/2019   Procedure: ABDOMINAL AORTOGRAM;  Surgeon: Wenona Hamilton, MD;  Location: [facility];   Service: Cardiovascular;  Laterality: N/A;   ABDOMINAL AORTOGRAM W/LOWER EXTREMITY N/A 06/07/2021   Procedure: ABDOMINAL AORTOGRAM W/LOWER EXTREMITY;  Surgeon: Avanell Leigh, MD;  Location: [facility];  Service: Cardiovascular;  Laterality: N/A;   ABDOMINAL AORTOGRAM W/LOWER EXTREMITY N/A 08/11/2023   Procedure: ABDOMINAL AORTOGRAM W/LOWER EXTREMITY;  Surgeon: Carlene Che, MD;  Location: [facility];  Service: Cardiovascular;  Laterality: N/A;   AMPUTATION Right 06/14/2017   Procedure: Right foot transmetatarsal amputation;  Surgeon: Timothy Ford, MD;  Location: [facility] OR;  Service: Orthopedics;  Laterality: Right;   AMPUTATION Left 06/28/2019   Procedure: AMPUTATION LEFT FIFTH TOE;  Surgeon: Mayo Speck, MD;  Location: [facility] OR;  Service: Vascular;  Laterality: Left;   AMPUTATION TOE Right 04/28/2017   Procedure: AMPUTATION OF RIGHT SECOND RAY;  Surgeon: Timothy Ford, MD;  Location[facility] OR;  Service: Orthopedics;  Laterality: Right;   CARDIAC CATHETERIZATION     CARDIAC CATHETERIZATION N/A 01/12/2016   Procedure: Temporary Wire;  Surgeon: Eilleen Grates, MD;  Location: [facility];  Service: Cardiovascular;  Laterality: N/A;   CARDIOVERSION  ~ 02/2013   "twice"    CARDIOVERSION N/A 09/26/2023   Procedure: CARDIOVERSION (CATH LAB);  Surgeon: Bridgette Campus, MD;  Location: [facility] INVASIVE CV LAB;  Service: Cardiovascular;  Laterality: N/A;   CAROTID ANGIOGRAM N/A 01/15/2015   Procedure: CAROTID ANGIOGRAM;  Surgeon: Avanell Leigh, MD;  Location: [facility] CATH LAB;  Service: Cardiovascular;  Laterality: N/A;   DILATION AND CURETTAGE OF UTERUS  1988   ENDARTERECTOMY Left 02/19/2015   Procedure: LEFT CAROTID ENDARTERECTOMY ;  Surgeon: Margherita Shell, MD;  Location: [facility] OR;  Service: Vascular;  Laterality: Left;   FEMORAL-TIBIAL BYPASS GRAFT Left 06/28/2019   Procedure: BYPASS GRAFT LEFT LEG FEMORAL TO ANTERIOR TIBIAL ARTERY using LEFT GREATER SAPHENOUS VEIN;  Surgeon: Mayo Speck, MD;   Location: [facility] OR;  Service: Vascular;  Laterality: Left;   LEFT HEART CATHETERIZATION WITH CORONARY ANGIOGRAM N/A 10/31/2014   Procedure: LEFT HEART CATHETERIZATION WITH CORONARY ANGIOGRAM;  Surgeon: Odie Benne, MD;  Location: [facility] CATH LAB;  Service: Cardiovascular;  Laterality: N/A;   LOWER EXTREMITY ANGIOGRAM N/A 09/10/2013   Procedure: LOWER EXTREMITY ANGIOGRAM;  Surgeon: Avanell Leigh, MD;  Location: [facility] CATH LAB;  Service: Cardiovascular;  Laterality: N/A;   LOWER EXTREMITY ANGIOGRAM N/A 01/15/2015   Procedure: LOWER EXTREMITY ANGIOGRAM;  Surgeon: Avanell Leigh, MD;  Location: [facility] CATH LAB;  Service: Cardiovascular;  Laterality: N/A;   LOWER EXTREMITY ANGIOGRAPHY N/A 04/13/2017   Procedure: Lower Extremity Angiography;  Surgeon: Avanell Leigh, MD;  [facility]: [facility];  Service: Cardiovascular;  Laterality: N/A;   LOWER EXTREMITY ANGIOGRAPHY Left 06/26/2019   Procedure: LOWER EXTREMITY ANGIOGRAPHY;  Surgeon: Wenona Hamilton, MD;  Location: [facility];  Service: Cardiovascular;  Laterality: Left;   PERIPHERAL VASCULAR ATHERECTOMY Right 06/07/2021   Procedure: PERIPHERAL VASCULAR ATHERECTOMY;  Surgeon: Avanell Leigh, MD;  Location: [facility] INVASIVE CV LAB;  Service: Cardiovascular;  Laterality: Right;   PERIPHERAL VASCULAR BALLOON ANGIOPLASTY Left 06/26/2019   Procedure: PERIPHERAL VASCULAR BALLOON ANGIOPLASTY;  Surgeon: Wenona Hamilton, MD;  Location: [facility];  Service: Cardiovascular;  Laterality: Left;  unable to cross lt sfa occlusion   PERIPHERAL VASCULAR INTERVENTION  04/13/2017   Procedure: Peripheral Vascular Intervention;  Surgeon: Avanell Leigh, MD;  Location: [facility] INVASIVE CV LAB;  Service: Cardiovascular;;   PERIPHERAL VASCULAR INTERVENTION  08/11/2023   Procedure: PERIPHERAL VASCULAR INTERVENTION;  Surgeon: Carlene Che, MD;  Location: [facility];  Service: Cardiovascular;;   PRESSURE SENSOR/CARDIOMEMS N/A 02/05/2020    Procedure: PRESSURE SENSOR/CARDIOMEMS;  Surgeon: Darlis Eisenmenger, MD;  Location: [facility] INVASIVE CV LAB;  Service: Cardiovascular;  Laterality: N/A;   VEIN HARVEST Left 06/28/2019   Procedure: LEFT LEG GREATER SAPHENOUS VEIN HARVEST;  Surgeon: Mayo Speck, MD;  Location: [facility] OR;  Service: Vascular;  Laterality: Left;    Family History  Problem Relation Age of Onset   Hypertension Mother    Diabetes Mother    Cancer Mother        breast, ovarian, colon   Clotting disorder Mother    Heart disease Mother    Heart attack Mother    Breast cancer Mother        in 17's   Hypertension Father    Heart disease Father    Emphysema Sister        smoked    Social History:  reports that she has been smoking e-cigarettes and cigarettes. She has a 44 pack-year smoking history. She has quit using smokeless tobacco.  Her smokeless tobacco use included snuff. She reports that she does not currently use alcohol. She reports that she does not currently use drugs after having used the following drugs: "Crack" cocaine, Marijuana, and Oxycodone .  Allergies:  Allergies  Allergen Reactions   Gabapentin  Nausea And Vomiting and Other (See Comments)    POSSIBLE SHAKING   Lyrica  [Pregabalin ] Other (See Comments)    Shaking     Medications: Scheduled:  allopurinol   150 mg Oral Daily   buPROPion   300 mg Oral q morning   insulin  aspart  0-5 Units Subcutaneous QHS   insulin  aspart  0-9 Units Subcutaneous TID WC   levothyroxine   50 mcg Oral Q0600   metoprolol  succinate  25 mg Oral Daily   mometasone -formoterol   2 puff Inhalation BID   pantoprazole  (PROTONIX ) IV  40 mg Intravenous Q12H   rosuvastatin   40 mg Oral Daily   sodium chloride  flush  3 mL Intravenous Q12H   sodium chloride  flush  3 mL Intravenous Q12H   Continuous:  sodium chloride      sodium chloride  75 mL/hr at 03/09/24 1610    Results for orders placed or performed during the hospital encounter of 03/08/24 (from the past 24 hours)  CBC with  Differential     Status: Abnormal   Collection Time: 03/08/24  6:28 PM  Result Value Ref Range   WBC 12.3 (H) 4.0 - 10.5 K/uL   RBC 1.69 (L) 3.87 - 5.11 MIL/uL  Hemoglobin 5.3 (LL) 12.0 - 15.0 g/dL   HCT 40.9 (L) 81.1 - 91.4 %   MCV 105.9 (H) 80.0 - 100.0 fL   MCH 31.4 26.0 - 34.0 pg   MCHC 29.6 (L) 30.0 - 36.0 g/dL   RDW 78.2 (H) 95.6 - 21.3 %   Platelets 218 150 - 400 K/uL   nRBC 0.2 0.0 - 0.2 %   Neutrophils Relative % 77 %   Neutro Abs 9.5 (H) 1.7 - 7.7 K/uL   Lymphocytes Relative 13 %   Lymphs Abs 1.6 0.7 - 4.0 K/uL   Monocytes Relative 7 %   Monocytes Absolute 0.9 0.1 - 1.0 K/uL   Eosinophils Relative 2 %   Eosinophils Absolute 0.2 0.0 - 0.5 K/uL   Basophils Relative 0 %   Basophils Absolute 0.0 0.0 - 0.1 K/uL   Immature Granulocytes 1 %   Abs Immature Granulocytes 0.14 (H) 0.00 - 0.07 K/uL  Comprehensive metabolic panel     Status: Abnormal   Collection Time: 03/08/24  6:28 PM  Result Value Ref Range   Sodium 138 135 - 145 mmol/L   Potassium 4.2 3.5 - 5.1 mmol/L   Chloride 99 98 - 111 mmol/L   CO2 25 22 - 32 mmol/L   Glucose, Bld 220 (H) 70 - 99 mg/dL   BUN 93 (H) 8 - 23 mg/dL   Creatinine, Ser 0.86 (H) 0.44 - 1.00 mg/dL   Calcium  9.2 8.9 - 10.3 mg/dL   Total Protein 6.3 (L) 6.5 - 8.1 g/dL   Albumin  3.0 (L) 3.5 - 5.0 g/dL   AST 19 15 - 41 U/L   ALT 13 0 - 44 U/L   Alkaline Phosphatase 66 38 - 126 U/L   Total Bilirubin 0.5 0.0 - 1.2 mg/dL   GFR, Estimated 23 (L) >60 mL/min   Anion gap 14 5 - 15  Troponin I (High Sensitivity)     Status: Abnormal   Collection Time: 03/08/24  6:28 PM  Result Value Ref Range   Troponin I (High Sensitivity) 94 (H) <18 ng/L  Brain natriuretic peptide     Status: Abnormal   Collection Time: 03/08/24  6:28 PM  Result Value Ref Range   B Natriuretic Peptide 666.1 (H) 0.0 - 100.0 pg/mL  POC occult blood, ED     Status: Abnormal   Collection Time: 03/08/24  7:23 PM  Result Value Ref Range   Fecal Occult Bld POSITIVE (A) NEGATIVE   Troponin I (High Sensitivity)     Status: Abnormal   Collection Time: 03/08/24  7:36 PM  Result Value Ref Range   Troponin I (High Sensitivity) 106 (HH) <18 ng/L  Type and screen     Status: None (Preliminary result)   Collection Time: 03/08/24  7:36 PM  Result Value Ref Range   ABO/RH(D) A POS    Antibody Screen NEG    Sample Expiration 03/11/2024,2359    Unit Number [id number]    Blood Component Type RED CELLS,LR    Unit division 00    Status of Unit ALLOCATED    Transfusion Status OK TO TRANSFUSE    Crossmatch Result Compatible    Unit Number [id number]    Blood Component Type RBC LR PHER1    Unit division 00    Status of Unit ISSUED    Transfusion Status OK TO TRANSFUSE    Crossmatch Result Compatible    Unit Number [id number]    Blood Component Type RED CELLS,LR    Unit  division 00    Status of Unit ISSUED,FINAL    Transfusion Status OK TO TRANSFUSE    Crossmatch Result      Compatible Performed at [facility], [address], [place], [place] [address]   Prepare RBC (crossmatch)     Status: None   Collection Time: 03/08/24  8:23 PM  Result Value Ref Range   Order Confirmation      ORDER PROCESSED BY BLOOD BANK Performed at [facility], [address] [place], [place] [address]   CBG monitoring, ED     Status: Abnormal   Collection Time: 03/08/24 11:50 PM  Result Value Ref Range   Glucose-Capillary 195 (H) 70 - 99 mg/dL  Hemoglobin and hematocrit, blood     Status: Abnormal   Collection Time: 03/09/24  2:09 AM  Result Value Ref Range   Hemoglobin 6.2 (LL) 12.0 - 15.0 g/dL   HCT 82.9 (L) 56.2 - 13.0 %  CBC     Status: Abnormal   Collection Time: 03/09/24  5:14 AM  Result Value Ref Range   WBC 11.1 (H) 4.0 - 10.5 K/uL   RBC 2.37 (L) 3.87 - 5.11 MIL/uL   Hemoglobin 7.4 (L) 12.0 - 15.0 g/dL   HCT 86.5 (L) 78.4 - 69.6 %   MCV 97.5 80.0 - 100.0 fL   MCH 31.2 26.0 - 34.0 pg   MCHC 32.0 30.0 - 36.0 g/dL   RDW 29.5 (H) 28.4 - 13.2 %    Platelets 222 150 - 400 K/uL   nRBC 0.3 (H) 0.0 - 0.2 %  Comprehensive metabolic panel     Status: Abnormal   Collection Time: 03/09/24  5:14 AM  Result Value Ref Range   Sodium 141 135 - 145 mmol/L   Potassium 4.2 3.5 - 5.1 mmol/L   Chloride 103 98 - 111 mmol/L   CO2 29 22 - 32 mmol/L   Glucose, Bld 177 (H) 70 - 99 mg/dL   BUN 93 (H) 8 - 23 mg/dL   Creatinine, Ser 4.40 (H) 0.44 - 1.00 mg/dL   Calcium  8.9 8.9 - 10.3 mg/dL   Total Protein 5.3 (L) 6.5 - 8.1 g/dL   Albumin  2.6 (L) 3.5 - 5.0 g/dL   AST 20 15 - 41 U/L   ALT 11 0 - 44 U/L   Alkaline Phosphatase 54 38 - 126 U/L   Total Bilirubin 0.7 0.0 - 1.2 mg/dL   GFR, Estimated 23 (L) >60 mL/min   Anion gap 9 5 - 15  Protime-INR     Status: Abnormal   Collection Time: 03/09/24  5:14 AM  Result Value Ref Range   Prothrombin Time 16.1 (H) 11.4 - 15.2 seconds   INR 1.3 (H) 0.8 - 1.2  APTT     Status: None   Collection Time: 03/09/24  5:14 AM  Result Value Ref Range   aPTT 26 24 - 36 seconds  CBG monitoring, ED     Status: Abnormal   Collection Time: 03/09/24 10:36 AM  Result Value Ref Range   Glucose-Capillary 154 (H) 70 - 99 mg/dL  Hemoglobin and hematocrit, blood     Status: Abnormal   Collection Time: 03/09/24 12:38 PM  Result Value Ref Range   Hemoglobin 7.6 (L) 12.0 - 15.0 g/dL   HCT 10.2 (L) 72.5 - 36.6 %   *Note: Due to a large number of results and/or encounters for the requested time period, some results have not been displayed. A complete  set of results can be found in Results Review.     DG Chest Port 1 View Result Date: 03/08/2024 CLINICAL DATA:  chest pain EXAM: PORTABLE CHEST 1 VIEW COMPARISON:  Chest x-ray 01/03/2024, CT chest 02/14/2023 FINDINGS: The heart and mediastinal contours are unchanged. Atherosclerotic plaque. Bibasilar atelectasis. No focal consolidation. No pulmonary edema. No pleural effusion. No pneumothorax. No acute osseous abnormality. IMPRESSION: 1. No active disease. 2.  Aortic Atherosclerosis  (ICD10-I70.0). Electronically Signed   By: Morgane  Naveau M.D.   On: 03/08/2024 18:48    ROS:  As stated above in the HPI otherwise negative.  Blood pressure (!) 131/93, pulse 79, temperature 97.7 F (36.5 C), temperature source Oral, resp. rate 18, height 5\' 3"  (1.6 m), weight 97.8 kg, SpO2 97%.    PE: Gen: NAD, Alert and Oriented HEENT:  [place]/AT, EOMI Neck: Supple, no LAD Lungs: CTA Bilaterally CV: RRR without M/G/R ABD: Soft, NTND, +BS Ext: No C/C/E  Assessment/Plan: 1) Symptomatic anemia. 2) Heme positive stool. 3) Chest pain - resolved. 4) GERD. 5) Dysphagia.   Her chest pain was presumed to be from a demand ischemia.  There is a history of GERD and dysphagia.  An EGD will be performed for further evaluation.  Plan: 1) EGD tomorrow. 2) Continue with PPI. 3) Monitor HGB and transfuse as necessary.  [name] D 03/09/2024, 2:05 PM

## 2024-03-09 NOTE — Assessment & Plan Note (Signed)
-   Eliquis  on hold - Continue Crestor

## 2024-03-09 NOTE — Assessment & Plan Note (Addendum)
-   Presented with chest pain complaints and some shortness of breath - Troponins elevated some on admission 94>>106 - EKG noted with afib but no alarming signs of ischemia - case initially discussed case with on-call cardiology. After anesthesia evaluation, request for formal cardiology consult for clearance prior to EGD - appreciate cardiology assistance; agrees to be acceptable risk for EGD; if continued CP/angina then may need more ischemic workup - will plan to target Hgb > 8 g/dL in setting of underlying heart disease and risk factors - follow up echo

## 2024-03-09 NOTE — Assessment & Plan Note (Signed)
 Continue Crestor

## 2024-03-10 ENCOUNTER — Inpatient Hospital Stay (HOSPITAL_COMMUNITY): Admitting: Anesthesiology

## 2024-03-10 ENCOUNTER — Encounter (HOSPITAL_COMMUNITY)
Admission: EM | Disposition: A | Discharge status: Home / Self Care | Payer: Self-pay | Source: Home / Self Care | Attending: Internal Medicine

## 2024-03-10 DIAGNOSIS — Z01810 Encounter for preprocedural cardiovascular examination: Secondary | ICD-10-CM | POA: Diagnosis not present

## 2024-03-10 DIAGNOSIS — I480 Paroxysmal atrial fibrillation: Secondary | ICD-10-CM | POA: Diagnosis not present

## 2024-03-10 DIAGNOSIS — R7989 Other specified abnormal findings of blood chemistry: Secondary | ICD-10-CM | POA: Diagnosis not present

## 2024-03-10 DIAGNOSIS — I2510 Atherosclerotic heart disease of native coronary artery without angina pectoris: Secondary | ICD-10-CM

## 2024-03-10 DIAGNOSIS — I5022 Chronic systolic (congestive) heart failure: Secondary | ICD-10-CM

## 2024-03-10 DIAGNOSIS — I2489 Other forms of acute ischemic heart disease: Secondary | ICD-10-CM | POA: Diagnosis not present

## 2024-03-10 DIAGNOSIS — K922 Gastrointestinal hemorrhage, unspecified: Secondary | ICD-10-CM | POA: Diagnosis not present

## 2024-03-10 LAB — MAGNESIUM: Magnesium: 2.3 mg/dL (ref 1.7–2.4)

## 2024-03-10 LAB — GLUCOSE, CAPILLARY
Glucose-Capillary: 91 mg/dL (ref 70–99)
Glucose-Capillary: 88 mg/dL (ref 70–99)
Glucose-Capillary: 110 mg/dL — ABNORMAL HIGH (ref 70–99)
Glucose-Capillary: 259 mg/dL — ABNORMAL HIGH (ref 70–99)
Glucose-Capillary: 200 mg/dL — ABNORMAL HIGH (ref 70–99)

## 2024-03-10 LAB — HEMOGLOBIN AND HEMATOCRIT, BLOOD
HCT: 22.5 % — ABNORMAL LOW (ref 36.0–46.0)
HCT: 26.8 % — ABNORMAL LOW (ref 36.0–46.0)
Hemoglobin: 7 g/dL — ABNORMAL LOW (ref 12.0–15.0)
Hemoglobin: 8.8 g/dL — ABNORMAL LOW (ref 12.0–15.0)

## 2024-03-10 LAB — BASIC METABOLIC PANEL WITH GFR
Anion gap: 8 (ref 5–15)
BUN: 59 mg/dL — ABNORMAL HIGH (ref 8–23)
CO2: 23 mmol/L (ref 22–32)
Calcium: 8.5 mg/dL — ABNORMAL LOW (ref 8.9–10.3)
Chloride: 108 mmol/L (ref 98–111)
Creatinine, Ser: 1.48 mg/dL — ABNORMAL HIGH (ref 0.44–1.00)
GFR, Estimated: 40 mL/min — ABNORMAL LOW (ref >60)
Glucose, Bld: 89 mg/dL (ref 70–99)
Potassium: 3.9 mmol/L (ref 3.5–5.1)
Sodium: 139 mmol/L (ref 135–145)

## 2024-03-10 LAB — PREPARE RBC (CROSSMATCH)

## 2024-03-10 SURGERY — CANCELLED PROCEDURE

## 2024-03-10 MED ORDER — SODIUM CHLORIDE 0.9% IV SOLUTION: Freq: Once | INTRAVENOUS | Status: AC

## 2024-03-10 MED ORDER — MUSCLE RUB 10-15 % EX CREA
TOPICAL_CREAM | CUTANEOUS | Status: DC | PRN
  Administered 2024-03-14: 1 via TOPICAL
  Filled 2024-03-10: qty 85

## 2024-03-10 MED ORDER — FUROSEMIDE 10 MG/ML IJ SOLN
40.0000 mg | Freq: Once | INTRAMUSCULAR | Status: AC
  Administered 2024-03-10: 40 mg via INTRAVENOUS
  Filled 2024-03-10: qty 4

## 2024-03-10 MED ORDER — GABAPENTIN 100 MG PO CAPS
200.0000 mg | ORAL_CAPSULE | Freq: Three times a day (TID) | ORAL | Status: DC
  Administered 2024-03-10 – 2024-03-17 (×21): 200 mg via ORAL
  Filled 2024-03-10 (×21): qty 2

## 2024-03-10 NOTE — Anesthesia Preprocedure Evaluation (Signed)
 Anesthesia Evaluation    Airway        Dental   Pulmonary Current Smoker and Patient abstained from smoking.          Cardiovascular hypertension,      Neuro/Psych    GI/Hepatic   Endo/Other  diabetes    Renal/GU      Musculoskeletal   Abdominal   Peds  Hematology  (+) Blood dyscrasia, anemia Lab Results      Component                Value               Date                      WBC                      11.1 (H)            03/09/2024                HGB                      7.5 (L)             03/09/2024                HCT                      23.2 (L)            03/09/2024                MCV                      97.5                03/09/2024                PLT                      222                 03/09/2024              Anesthesia Other Findings   Reproductive/Obstetrics                             Anesthesia Physical Anesthesia Plan Anesthesia Quick Evaluation

## 2024-03-10 NOTE — Plan of Care (Signed)
  Problem: Clinical Measurements: Goal: Will remain free from infection Outcome: Progressing   Problem: Clinical Measurements: Goal: Ability to maintain clinical measurements within normal limits will improve Outcome: Progressing   Problem: Clinical Measurements: Goal: Cardiovascular complication will be avoided Outcome: Progressing   Problem: Activity: Goal: Risk for activity intolerance will decrease Outcome: Progressing

## 2024-03-10 NOTE — Consult Note (Addendum)
 Cardiology Consultation   Patient ID: [name] MRN: [id number]; DOB: [birth year]  Admit date: 03/08/2024 Date of Consult: 03/10/2024  PCP:  Karla Snare, NP   [facility] Providers Cardiologist:  Karla Portal, MD  Electrophysiologist:  Karla Pump, MD  Advanced Heart Failure:  Karla Bourdon, MD       Patient Profile:   [name] is a 63 y.o. female with a hx of  HFmrEF (heart failure with mildly reduced ejection fraction)  S/p CardioMEMS; Followed by CHF clinic GDMT limited by AKI; intolerant of SGLT2 inhibitor 2/2 UTIs EF as low as 40-45 TTE 09/27/2023: EF 45-50, GR 2 DD, normal RVSF, RVSP 50, mild MR Paroxysmal atrial fibrillation  Admission x 2 in 09/2023 with AF with RVR s/p DCCV Coronary artery disease  LHC in 10/2014: Small OM1 80, O/W nonobstructive CAD Peripheral arterial disease  S/p atherectomy R SFA in 03/2017 S/p R transmet amputation S/p L Fem-pop bypass, L fifth toe amputation S/p R SFA angioplasty Carotid stenosis s/p L CEA  Chronic kidney disease  Hypertension  Hyperlipidemia  Poorly controlled diabetes mellitus, insulin -dependent Chronic Obstructive Pulmonary Disease  OSA Prior history of cocaine, alcohol abuse  who is being seen 03/10/2024 for the evaluation of elevated troponin at the request of [name].  History of Present Illness:   [name] presented to the hospital yesterday with complaints of chest discomfort, shortness of breath and dark stools.  Hemoglobin was noted to be 5.3.  Melena was noted in rectal vault. Stool was heme positive.  She was transfused with PRBCs x 2.  She has been seen by gastroenterology.  Plan is to proceed with EGD.  Her troponins are noted to be elevated and cardiology is asked to further evaluate.  Data SCr 2.32 >> 2.31 >> 1.48 Hgb 5.3 >>> 7.0 K 3.9, Alb 2.6, ALT 11 BNP 666.1 hsT 94 >> 106 EKG 03/08/24: Atrial fibrillation, LVH, <1 mm ST depression 1, 2, V4-V5  She was seen in  CHF clinic 02/29/24 and started on hydralazine . She notes feeling bad since. She mainly noted chest pain at night when she would lie in bed. She notes central heaviness with radiation to her neck. Her symptoms progressed and she noted shortness of breath with lying down as well. This prompted her to come to the ED. She feels better after transfusion with PRBCs. She notes she is still having some symptoms. She has not had syncope, significant weight gain, change in leg edema. She has a lot of leg pain she attributes to neuropathy.  Past Medical History:  Diagnosis Date   Acute renal failure ([name])    Acute respiratory failure with hypoxia ([name]) 12/02/2019   Alcohol abuse    Alcoholic cirrhosis ([name])    Anemia    Anxiety    Arthritis    "knees" (11/26/2018)   B12 deficiency    CAD (coronary artery disease)    a. 11/10/2014 Cath: LM nl, LAD min irregs, D1 30 ost, D2 50d, LCX 55m, OM1 80 p/m (1.5 mm vessel), OM2 32m, RCA nondom 92m-->med rx.. Demand ischemia in the setting of rapid a-fib.   Cardiomyopathy ([name])    Carotid artery disease ([name])    a. 01/2015 Carotid Angio: RICA 100, LICA 95p; b. 02/2015 s/p L CEA; c. 05/2019 Carotid U/S: RICA 100. RECA >50. LICA 1-39%.   Cellulitis    lower extremities   Cellulitis in diabetic foot ([name]) 07/08/2019   CHF (congestive heart failure) ([name])  Chronic combined systolic and diastolic CHF (congestive heart failure) ([name])    a. 10/2014 Echo: EF 40-45%; b. 10/2018 Echo: EF 45-50%, gr2 DD; c. 11/2019 Echo: EF 50%, mild LVH, gr2 DD (restrictive), antlat HK, Nl RV fxn. Mild BAE. RVSP 59.30mmHg.   CKD (chronic kidney disease), stage III ([name])    Cocaine abuse ([name])    COPD (chronic obstructive pulmonary disease) ([name])    Critical ischemia of foot ([name]) 06/07/2021   Critical limb ischemia     Critical lower limb ischemia ([name])    Demand ischemia ([name]) 10/29/2014   Depression    Diabetes mellitus without complication ([name])    Diabetic peripheral neuropathy  ([name])    DVT (deep venous thrombosis) ([name])    Dyspnea    Elevated troponin    a. Chronic elevation.   Fall 05/05/2021   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[name])    Peripheral arterial disease/critical limb ischemia     GERD (gastroesophageal reflux disease)    Hyperlipemia    Hypertension    Hypokalemia    Hypomagnesemia    Hypothyroidism    Marijuana abuse    Narcotic abuse ([name])    Noncompliance    NSVT (nonsustained ventricular tachycardia) ([name])    Obesity    Osteomyelitis ([name]) 06/21/2019   PAF (paroxysmal atrial fibrillation) ([name])    Paroxysmal atrial tachycardia ([name])    Peripheral arterial disease ([name])    a. 01/2015 Angio/PTA: RSFA 99 (atherectomy/pta) - 1 vessel runoff via diff dzs peroneal; b. 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   Pneumonia    "once or twice" (11/26/2018)   Poorly controlled type 2 diabetes mellitus ([name])    Renal disorder    Renal insufficiency    a. 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   SIRS (systemic inflammatory response syndrome) ([name]) 04/06/2017   Sleep apnea    "couldn't handle wearing the mask" (11/26/2018)   Symptomatic bradycardia    a. Avoid AV blocking agent per EP. Prev req temp wire in 2017.   Tobacco abuse    Wrist fracture, left, closed, initial encounter 01/29/2015    Past Surgical History:  Procedure Laterality Date   ABDOMINAL AORTOGRAM N/A 06/26/2019   Procedure: ABDOMINAL AORTOGRAM;  Surgeon: Wenona Hamilton, MD;  Location: [facility] INVASIVE CV LAB;  Service: Cardiovascular;  Laterality: N/A;   ABDOMINAL AORTOGRAM W/LOWER EXTREMITY N/A 06/07/2021   Procedure: ABDOMINAL AORTOGRAM W/LOWER EXTREMITY;  Surgeon: Avanell Leigh, MD;  Location: [facility];  Service: Cardiovascular;  Laterality: N/A;   ABDOMINAL AORTOGRAM W/LOWER EXTREMITY N/A 08/11/2023   Procedure: ABDOMINAL AORTOGRAM W/LOWER EXTREMITY;  Surgeon: Carlene Che, MD;  Location: [facility];  Service: Cardiovascular;  Laterality: N/A;    AMPUTATION Right 06/14/2017   Procedure: Right foot transmetatarsal amputation;  Surgeon: Timothy Ford, MD;  Location: [facility] OR;  Service: Orthopedics;  Laterality: Right;   AMPUTATION Left 06/28/2019   Procedure: AMPUTATION LEFT FIFTH TOE;  Surgeon: Mayo Speck, MD;  Location: [facility] OR;  Service: Vascular;  Laterality: Left;   AMPUTATION TOE Right 04/28/2017   Procedure: AMPUTATION OF RIGHT SECOND RAY;  Surgeon: Timothy Ford, MD;  Location: [facility] OR;  Service: Orthopedics;  Laterality: Right;   CARDIAC CATHETERIZATION     CARDIAC CATHETERIZATION N/A 01/12/2016   Procedure: Temporary Wire;  Surgeon: Eilleen Grates, MD;  Location: [facility];  Service: Cardiovascular;  Laterality: N/A;   CARDIOVERSION  ~ 02/2013   "twice"  CARDIOVERSION N/A 09/26/2023   Procedure: CARDIOVERSION (CATH LAB);  Surgeon: Bridgette Campus, MD;  Location: [facility] INVASIVE CV LAB;  Service: Cardiovascular;  Laterality: N/A;   CAROTID ANGIOGRAM N/A 01/15/2015   Procedure: CAROTID ANGIOGRAM;  Surgeon: Avanell Leigh, MD;  Location: [facility] CATH LAB;  Service: Cardiovascular;  Laterality: N/A;   DILATION AND CURETTAGE OF UTERUS  1988   ENDARTERECTOMY Left 02/19/2015   Procedure: LEFT CAROTID ENDARTERECTOMY ;  Surgeon: Margherita Shell, MD;  Location: [facility] OR;  Service: Vascular;  Laterality: Left;   FEMORAL-TIBIAL BYPASS GRAFT Left 06/28/2019   Procedure: BYPASS GRAFT LEFT LEG FEMORAL TO ANTERIOR TIBIAL ARTERY using LEFT GREATER SAPHENOUS VEIN;  Surgeon: Mayo Speck, MD;  Location: [facility] OR;  Service: Vascular;  Laterality: Left;   LEFT HEART CATHETERIZATION WITH CORONARY ANGIOGRAM N/A 10/31/2014   Procedure: LEFT HEART CATHETERIZATION WITH CORONARY ANGIOGRAM;  Surgeon: Odie Benne, MD;  Location: [facility] CATH LAB;  Service: Cardiovascular;  Laterality: N/A;   LOWER EXTREMITY ANGIOGRAM N/A 09/10/2013   Procedure: LOWER EXTREMITY ANGIOGRAM;  Surgeon: Avanell Leigh, MD;  Location: [facility] CATH LAB;  Service: Cardiovascular;  Laterality:  N/A;   LOWER EXTREMITY ANGIOGRAM N/A 01/15/2015   Procedure: LOWER EXTREMITY ANGIOGRAM;  Surgeon: Avanell Leigh, MD;  Location: [facility] CATH LAB;  Service: Cardiovascular;  Laterality: N/A;   LOWER EXTREMITY ANGIOGRAPHY N/A 04/13/2017   Procedure: Lower Extremity Angiography;  Surgeon: Avanell Leigh, MD;  Location: [facility] INVASIVE CV LAB;  Service: Cardiovascular;  Laterality: N/A;   LOWER EXTREMITY ANGIOGRAPHY Left 06/26/2019   Procedure: LOWER EXTREMITY ANGIOGRAPHY;  Surgeon: Wenona Hamilton, MD;  [facility]: [facility];  Service: Cardiovascular;  Laterality: Left;   PERIPHERAL VASCULAR ATHERECTOMY Right 06/07/2021   Procedure: PERIPHERAL VASCULAR ATHERECTOMY;  Surgeon: Avanell Leigh, MD;  Location: [facility] INVASIVE CV LAB;  Service: Cardiovascular;  Laterality: Right;   PERIPHERAL VASCULAR BALLOON ANGIOPLASTY Left 06/26/2019   Procedure: PERIPHERAL VASCULAR BALLOON ANGIOPLASTY;  Surgeon: Wenona Hamilton, MD;  [facility]: [facility];  Service: Cardiovascular;  Laterality: Left;  unable to cross lt sfa occlusion   PERIPHERAL VASCULAR INTERVENTION  04/13/2017   Procedure: Peripheral Vascular Intervention;  Surgeon: Avanell Leigh, MD;  Location: [facility] INVASIVE CV LAB;  Service: Cardiovascular;;   PERIPHERAL VASCULAR INTERVENTION  08/11/2023   Procedure: PERIPHERAL VASCULAR INTERVENTION;  Surgeon: Carlene Che, MD;  Location: [facility] CV [facility];  Service: Cardiovascular;;   PRESSURE SENSOR/CARDIOMEMS N/A 02/05/2020   Procedure: PRESSURE SENSOR/CARDIOMEMS;  Surgeon: Darlis Eisenmenger, MD;  Location: [facility] INVASIVE CV LAB;  Service: Cardiovascular;  Laterality: N/A;   VEIN HARVEST Left 06/28/2019   Procedure: LEFT LEG GREATER SAPHENOUS VEIN HARVEST;  Surgeon: Mayo Speck, MD;  Location: [facility] OR;  Service: Vascular;  Laterality: Left;     Home Medications:  Prior to Admission medications   Medication Sig Start Date End Date Taking? Authorizing Provider  acetaminophen  (TYLENOL ) 500 MG tablet  Take 1,000 mg by mouth every 6 (six) hours as needed for headache (pain).   Yes [provider]  albuterol  (VENTOLIN  HFA) 108 (90 Base) MCG/ACT inhaler USE 2 PUFFS BY MOUTH EVERY FOUR HOURS, AS NEEDED, FOR COUGHING/WHEEZING 07/12/23  Yes Newlin, [name], MD  allopurinol  (ZYLOPRIM ) 100 MG tablet Take 100 mg by mouth daily.   Yes [provider]  apixaban  (ELIQUIS ) 5 MG TABS tablet Take 1 tablet (5 mg total) by mouth 2 (two) times daily. 07/05/23  Yes Clegg, [name] D, NP  budesonide -formoterol  (  SYMBICORT ) 160-4.5 MCG/ACT inhaler Inhale 2 puffs into the lungs 2 (two) times daily. 07/12/23  Yes Newlin, [name], MD  buPROPion  (WELLBUTRIN  XL) 300 MG 24 hr tablet Take 300 mg by mouth every morning. 02/14/24  Yes [provider]  Cholecalciferol  (VITAMIN D3) 50 MCG (2000 UT) capsule Take 1 capsule by mouth every morning.   Yes [provider]  colchicine  0.6 MG tablet Take 1.2 mg by mouth See admin instructions. Take 1.2 mg for flair up and an hour later take 0.6 mg if needed   Yes [provider]  erythromycin ophthalmic ointment Place 1 Application into the right eye 2 (two) times daily. 02/27/24  Yes [provider]  fluticasone  (FLONASE) 50 MCG/ACT nasal spray Place 1 spray into both nostrils as needed for allergies or rhinitis.   Yes [provider]  hydrALAZINE  (APRESOLINE ) 25 MG tablet Take 1 tablet (25 mg total) by mouth 3 (three) times daily. 02/29/24 05/29/24 Yes Lee, [place], NP  insulin  lispro (HUMALOG ) 100 UNIT/ML KwikPen Inject 20 Units into the skin 3 (three) times daily with meals. Plus sliding scale, max dose 70 units /day 03/07/24  Yes Motwani, Komal, MD  LANTUS  SOLOSTAR 100 UNIT/ML Solostar Pen Inject 37 Units into the skin daily. Patient taking differently: Inject 56 Units into the skin at bedtime. 04/05/23  Yes Newlin, [name], MD  levothyroxine  (SYNTHROID ) 50 MCG tablet TAKE 1 TABLET (50 MCG TOTAL) BY MOUTH DAILY BEFORE BREAKFAST (AM)  05/02/23  Yes Newlin, [name], MD  Menthol -Camphor (ICY HOT PRO NO MESS EX) Apply 1 Application topically 3 (three) times daily as needed (arms, neuropathy lower extrimity, gout in hand.).   Yes [provider]  metoprolol  succinate (TOPROL -XL) 25 MG 24 hr tablet Take 1 tablet (25 mg total) by mouth daily. 10/19/23  Yes Ruddy Corral M, PA-C  NYAMYC  powder Apply 1 Application topically 2 (two) times daily. 02/09/24  Yes [provider]  nystatin  cream (MYCOSTATIN ) Apply 1 Application topically 2 (two) times daily as needed for dry skin. 02/09/24  Yes [provider]  Omega-3 Fatty Acids (FISH OIL PO) Take 1 tablet by mouth daily.   Yes [provider]  pantoprazole  (PROTONIX ) 40 MG tablet TAKE 1 TABLET (40 MG TOTAL) BY MOUTH DAILY(AM) 05/02/23  Yes Newlin, [name], MD  polyethylene glycol (MIRALAX  / GLYCOLAX ) 17 g packet Take 17 g by mouth daily as needed for moderate constipation. 08/05/23  Yes Akula, Vijaya, MD  rosuvastatin  (CRESTOR ) 40 MG tablet TAKE 1 TABLET (40 MG TOTAL) BY MOUTH DAILY Patient taking differently: Take 40 mg by mouth at bedtime. 11/23/23  Yes Avanell Leigh, MD  tirzepatide [facility]) 7.5 MG/0.5ML Pen Inject 7.5 mg into the skin once a week. 03/05/24  Yes Motwani, Komal, MD  torsemide  (DEMADEX ) 20 MG tablet Take 80 mg by mouth 2 (two) times daily. 02/14/24  Yes [provider]  triamcinolone  ointment (KENALOG ) 0.1 % Apply topically 2 (two) times daily. Patient taking differently: Apply 1 Application topically 2 (two) times daily as needed (Skin irritation). 08/09/23  Yes Newlin, [name], MD  VITAMIN E PO Take 1 tablet by mouth daily.   Yes [provider]  gabapentin  (NEURONTIN ) 100 MG capsule Take 1 capsule (100 mg total) by mouth 3 (three) times daily. Patient not taking: Reported on 03/08/2024 02/05/24 05/05/24  Motwani, Komal, MD  tiotropium (SPIRIVA  HANDIHALER) 18 MCG inhalation capsule Place 1 capsule (18 mcg total) into  inhaler and inhale every morning. Patient not taking: Reported on 02/09/2021 12/09/19  02/09/21  Armenta Landau, MD    Inpatient Medications: Scheduled Meds:  sodium chloride    Intravenous Once   allopurinol   150 mg Oral Daily   buPROPion   300 mg Oral q morning   insulin  aspart  0-5 Units Subcutaneous QHS   insulin  aspart  0-9 Units Subcutaneous TID WC   levothyroxine   50 mcg Oral Q0600   metoprolol  succinate  25 mg Oral Daily   mometasone -formoterol   2 puff Inhalation BID   pantoprazole  (PROTONIX ) IV  40 mg Intravenous Q12H   rosuvastatin   40 mg Oral Daily   sodium chloride  flush  3 mL Intravenous Q12H   sodium chloride  flush  3 mL Intravenous Q12H   Continuous Infusions:  sodium chloride  75 mL (03/10/24 0955)   PRN Meds: acetaminophen  **OR** acetaminophen , albuterol , dextrose , melatonin, methocarbamol , ondansetron  **OR** ondansetron  (ZOFRAN ) IV, sodium chloride  flush  Allergies:    Allergies  Allergen Reactions   Gabapentin  Nausea And Vomiting and Other (See Comments)    POSSIBLE SHAKING   Lyrica  [Pregabalin ] Other (See Comments)    Shaking     Social History:   Social History   Socioeconomic History   Marital status: Single    Spouse name: Not on file   Number of children: 1   Years of education: 12   Highest education level: 12th grade  Occupational History   Occupation: disabled  Tobacco Use   Smoking status: Every Day    Current packs/day: 1.00    Average packs/day: 1 pack/day for 44.0 years (44.0 ttl pk-yrs)    Types: E-cigarettes, Cigarettes   Smokeless tobacco: Former    Types: Snuff  Vaping Use   Vaping status: Former   Devices: 11/26/2018 "stopped months ago"  Substance and Sexual Activity   Alcohol use: Not Currently    Comment: occ   Drug use: Not Currently    Types: "Crack" cocaine, Marijuana, Oxycodone    Sexual activity: Not Currently  Other Topics Concern   Not on file  Social History Narrative   ** Merged History Encounter **        Lives in [place], in motel with sister.  They are looking to move but don't have a place to go yet.     Social Drivers of Corporate investment banker Strain: Low Risk  (02/09/2023)   Overall Financial Resource Strain (CARDIA)    Difficulty of Paying Living Expenses: Not hard at all  Food Insecurity: No Food Insecurity (03/09/2024)   Hunger Vital Sign    Worried About Running Out of Food in the Last Year: Never true    Ran Out of Food in the Last Year: Never true  Transportation Needs: Unmet Transportation Needs (03/09/2024)   PRAPARE - Transportation    Lack of Transportation (Medical): Yes    Lack of Transportation (Non-Medical): Yes  Physical Activity: Inactive (02/09/2023)   Exercise Vital Sign    Days of Exercise per Week: 0 days    Minutes of Exercise per Session: 0 min  Stress: No Stress Concern Present (02/09/2023)   [employer] of Occupational Health - Occupational Stress Questionnaire    Feeling of Stress : Not at all  Social Connections: Unknown (08/21/2023)   Received from [facility]   Social Network    Social Network: Not on file  Intimate Partner Violence: Not At Risk (03/09/2024)   Humiliation, Afraid, Rape, and Kick questionnaire    Fear of Current or Ex-Partner: No    Emotionally Abused: No    Physically  Abused: No    Sexually Abused: No    Family History:    Family History  Problem Relation Age of Onset   Hypertension Mother    Diabetes Mother    Cancer Mother        breast, ovarian, colon   Clotting disorder Mother    Heart disease Mother    Heart attack Mother    Breast cancer Mother        in 33's   Hypertension Father    Heart disease Father    Emphysema Sister        smoked     ROS:  Please see the history of present illness.  All other ROS reviewed and negative.     Physical Exam/Data:   Vitals:   03/10/24 0058 03/10/24 0410 03/10/24 0912 03/10/24 0951  BP: (!) 107/42 (!) 112/51  (!) 144/66  Pulse: 88 87  88  Resp: 18 17    Temp:  98.2 F (36.8 C) 98.2 F (36.8 C)    TempSrc: Oral Oral    SpO2: 100% 100% 100%   Weight:      Height:        Intake/Output Summary (Last 24 hours) at 03/10/2024 1406 Last data filed at 03/10/2024 1610 Gross per 24 hour  Intake 1998.98 ml  Output 1800 ml  Net 198.98 ml      03/09/2024   11:01 AM 03/05/2024    1:09 PM 02/29/2024   11:16 AM  Last 3 Weights  Weight (lbs) 215 lb 9.6 oz 216 lb 216 lb 9.6 oz  Weight (kg) 97.796 kg 97.977 kg 98.249 kg     Body mass index is 38.19 kg/m.  General:  Well nourished, well developed, in no acute distress HEENT: normal Neck: + JVD + HJR Cardiac:  normal S1, S2; RRR; no murmur   Lungs:  clear to auscultation bilaterally, no wheezing, rhonchi or rales  Abd: soft  Ext: tight bilateral trace to 1+ edema Skin: warm and dry  Neuro:  CNs 2-12 intact, no focal abnormalities noted Psych:  Normal affect   EKG:  The EKG was personally reviewed and demonstrates:  see HPI Telemetry:  Telemetry was personally reviewed and demonstrates:  NSR  Relevant CV Studies:  Laboratory Data:  High Sensitivity Troponin:   Recent Labs  Lab 03/08/24 1828 03/08/24 1936  TROPONINIHS 94* 106*     Chemistry Recent Labs  Lab 03/08/24 1828 03/09/24 0514 03/10/24 0910  NA 138 141 139  K 4.2 4.2 3.9  CL 99 103 108  CO2 25 29 23   GLUCOSE 220* 177* 89  BUN 93* 93* 59*  CREATININE 2.32* 2.31* 1.48*  CALCIUM  9.2 8.9 8.5*  MG  --   --  2.3  GFRNONAA 23* 23* 40*  ANIONGAP 14 9 8     Recent Labs  Lab 03/08/24 1828 03/09/24 0514  PROT 6.3* 5.3*  ALBUMIN  3.0* 2.6*  AST 19 20  ALT 13 11  ALKPHOS 66 54  BILITOT 0.5 0.7   Lipids No results for input(s): "CHOL", "TRIG", "HDL", "LABVLDL", "LDLCALC", "CHOLHDL" in the last 168 hours.  Hematology Recent Labs  Lab 03/08/24 1828 03/09/24 0209 03/09/24 0514 03/09/24 1238 03/09/24 1646 03/10/24 0910  WBC 12.3*  --  11.1*  --   --   --   RBC 1.69*  --  2.37*  --   --   --   HGB 5.3*   < > 7.4* 7.6*  7.5* 7.0*  HCT 17.9*   < >  23.1* 23.1* 23.2* 22.5*  MCV 105.9*  --  97.5  --   --   --   MCH 31.4  --  31.2  --   --   --   MCHC 29.6*  --  32.0  --   --   --   RDW 17.9*  --  18.1*  --   --   --   PLT 218  --  222  --   --   --    < > = values in this interval not displayed.   Thyroid  No results for input(s): "TSH", "FREET4" in the last 168 hours.  BNP Recent Labs  Lab 03/08/24 1828  BNP 666.1*    DDimer No results for input(s): "DDIMER" in the last 168 hours.   Radiology/Studies:  DG Chest Port 1 View Result Date: 03/08/2024 CLINICAL DATA:  chest pain EXAM: PORTABLE CHEST 1 VIEW COMPARISON:  Chest x-ray 01/03/2024, CT chest 02/14/2023 FINDINGS: The heart and mediastinal contours are unchanged. Atherosclerotic plaque. Bibasilar atelectasis. No focal consolidation. No pulmonary edema. No pleural effusion. No pneumothorax. No acute osseous abnormality. IMPRESSION: 1. No active disease. 2.  Aortic Atherosclerosis (ICD10-I70.0). Electronically Signed   By: Morgane  Naveau M.D.   On: 03/08/2024 18:48     Assessment and Plan:   1. Elevated Troponin Troponin elevation likely related to demand ischemia in the setting of profound anemia from GI bleed with known coronary artery disease (which is likely worse now given her risk factors, vascular disease) on top of a/c CHF and chronic myocardial injury from chronic kidney disease. Patient needs EGD for further management of GI bleed.  - Recommend echocardiogram to r/o new wall motion abnormalities, worsening EF.  - If no changes, she should be able to proceed with EGD at acceptable risk.   2. HFmrEF (heart failure with mildly reduced ejection fraction)  BNP elevated in the setting of atrial fibrillation. But she appears volume overloaded. She is on Torsemide  80 mg once daily at home. She notes starting to feel bad once she started on Hydralazine . Question if she was getting hypotensive in the setting of anemia to account for her symptoms. GDMT  limited by chronic kidney disease. Agree with holding hydralazine  until hemodynamically stable from GI bleed. - Oliwia Berzins give Lasix  40 mg IV x 1   3. Paroxysmal atrial fibrillation  She was back in atrial fibrillation on admit. But she looks to be in NSR now on Tele. Eliquis  on hold b/c of GI bleed. Continue Metoprolol  succinate for rate control.  4. Coronary artery disease  Hx of small OM1 with 80% stenosis on cath in 2015. With peripheral arterial disease, suspect she has more significant disease now. She is a poor candidate for cardiac catheterization given chronic kidney disease. If no WMA on echocardiogram, likely Demarian Epps plan med Rx. If she continues to have angina, would need to consider ischemic eval.  5. Symptomatic anemia Hgb improved after transfusion with PRBCs. However, Hgb still 7. May need further transfusion given clinical picture.    Risk Assessment/Risk Scores:       New York  Heart Association (NYHA) Functional Class NYHA Class III  CHA2DS2-VASc Score = 5   This indicates a 7.2% annual risk of stroke. The patient's score is based upon: CHF History: 1 HTN History: 1 Diabetes History: 1 Stroke History: 0 Vascular Disease History: 1 Age Score: 0 Gender Score: 1       For questions or updates, please contact [facility] Please consult  [email or web address] for contact info under    Signed, Marlyse Single, PA-C  03/10/2024 2:06 PM  I have seen and examined this patient with Marlyse Single.  Agree with above, note added to reflect my findings.  Complex past medical history as above.  She presented to the hospital with chest discomfort, shortness of breath, dark stools.  She was noted to have a hemoglobin of 5.3.  Melena was noted in the rectal vault and she had heme positive stools.  She was transfused 2 units of blood.  Her troponins were mildly elevated.  She now feels well.  She has not had any further chest pain.  She has plans for upcoming endoscopy.  GEN: No acute  distress.   Neck: No JVD Cardiac: Tachycardic, regular, no murmurs, rubs, or gallops.  Respiratory: normal BS bases bilaterally. GI: Soft, nontender, non-distended  MS: No edema; No deformity. Neuro:  Nonfocal  Skin: warm and dry Psych: Normal affect    Elevated troponin: Troponin was found to be mildly elevated on admission.  Her hemoglobin was 5.3.  She does have known coronary artery disease.  This is likely related to demand ischemia.  Kalon Erhardt plan for transthoracic echo.  If there is a new wall motion abnormality, we Jacklyne Baik plan further management, but if echo remains stable, no changes. Chronic systolic heart failure: BNP mildly elevated in the setting of atrial fibrillation.  Appears mildly volume overloaded.  Khamille Beynon give 1 further dose of Lasix . Paroxysmal atrial fibrillation: Was in atrial fibrillation on admission.  Holding Eliquis  for GI bleeding.  Has converted to sinus rhythm. Coronary artery disease: Has OM1 80% stenosis on catheterization in 2015. Symptomatic anemia: Has plans for endoscopy.  If echo is without major abnormality, and there is no evidence of wall motion abnormality, she would be at intermediate risk for an intermediate risk procedure and no further cardiac testing would be necessary.    Kalik Hoare M. Anahit Klumb MD 03/10/2024 2:28 PM

## 2024-03-10 NOTE — Progress Notes (Signed)
 Progress Note    [name]   ZOX:[id number]  DOB: [birth year]  DOA: 03/08/2024     2 PCP: Maryellen Snare, NP  Initial CC: dark stool, chest pain  Hospital Course: Ms. [name] is a 63 yo female with PMH CAD, HFrEF, dCHF, CAD, CKDIV, carotid stenosis s/p endarterectomy, PAF, PAD, COPD, ongoing tobacco use, chronic bradycardia, gout, HTN, DM II, HLD, vitamin D  deficiency who presented with chest pain complaints.  She was given nitroglycerin  by EMS.  She also was complaining of dark stools.  She states she does take BC powder approximately 1-2 times per month but denied any more frequent.  ED Course:  At presentation to ED patient is borderline hypotensive blood pressure 110/41, tachycardic 99-108.  O2 sat 100% room air. Troponin 94>>106 CBC showing low hemoglobin 5.3 (baseline hemoglobin around 10-11.  Elevated WBC count 12.3 for last 2 months.  Normal platelet count. CMP showing creatinine 2.32 and GFR 23.  Renal function at baseline.  Otherwise unremarkable CMP. Elevated BNP 666. EKG showing atrial fibrillation heart rate 99, left ventricular hypertrophy pattern. Chest ray showing no acute disease process.  Aortic atherosclerosis.   Dr.Yao from bedside rectal exam which showed patient has melena colicky stool in the rectal vault.   In the ED 2 unit blood transfusion has been ordered.  And patient received IV Protonix  80 mg.  She was admitted for further GI bleed workup.  Interval History:  No further bowel movements she states and denies any abd pain or CP to me this morning.  Awaiting Hgb still this morning, but it later returned at 7 g/dL, so receiving 1 more unit of blood today.  Anesthesia also requesting cardiology evaluation and EGD postponed until then.  Assessment and Plan: * GI bleed - Potential for upper GIB given eliquis  use and intermittent use of BC powder -Initial hemoglobin 5.3 g/dL with baseline around 11 g/dL - She received 2 units PRBC on admission and repeat  hemoglobin 4/26, 7.6 g/dL - GI consulted on admission for potential EGD, follow up evaluation - Anesthesia requesting cardiology evaluation.  EGD postponed until then - continue trending H/H and will transfuse further if necessary - continue protonix   - Hgb target > 8 g/dL (see demand ischemia below) - Hgb back down this am to 7 g/dL, will transfuse 1 more unit PRBC and continue trending   Paroxysmal atrial fibrillation ([name]) - Eliquis  on hold for now -Continue Toprol   Demand ischemia ([name]) - Presented with chest pain complaints and some shortness of breath - Troponins elevated some on admission 94>>106 - EKG noted with afib but no alarming signs of ischemia - case initially discussed case with on-call cardiology. After anesthesia evaluation, request for formal cardiology consult for clearance prior to EGD - appreciate cardiology assistance; agrees to be acceptable risk for EGD; if continued CP/angina then may need more ischemic workup - will plan to target Hgb > 8 g/dL in setting of underlying heart disease and risk factors - follow up echo  CKD (chronic kidney disease), stage IV ([name]) - patient has history of CKD4. Baseline creat ~ 2.2, eGFR~ 25   Chest pain-resolved as of 03/09/2024 - see demand ischemia  HFrEF (heart failure with reduced ejection fraction) ([name]) - no s/s exacerbation  - Last echo reviewed from 09/27/2023: EF 45 to 50%, RWMA, mild LVH, grade 2 DD  Symptomatic anemia - see GIB  Insulin  dependent type 2 diabetes mellitus ([name]) - Last A1c 12.6% on 01/23/2024 - Continue on SSI and  CBG monitoring for now  Peripheral artery disease ([name]) - Eliquis  on hold - Continue Crestor   COPD (chronic obstructive pulmonary disease) ([name]) - No signs/symptoms of exacerbation  Hyperlipidemia - Continue Crestor   Hypothyroidism - Continue Synthroid   GAD (generalized anxiety disorder) - Continue Wellbutrin   History of CAD (coronary artery disease) - Eliquis  on hold -  Continue statin, Toprol    Old records reviewed in assessment of this patient  Antimicrobials:   DVT prophylaxis:  SCDs Start: 03/08/24 2023 Place and maintain sequential compression device Start: 03/08/24 2023 Place TED hose Start: 03/08/24 2023   Code Status:   Code Status: Full Code  Mobility Assessment (Last 72 Hours)     Mobility Assessment     Row Name 03/09/24 2000 03/09/24 1400 03/09/24 1101       Does patient have an order for bedrest or is patient medically unstable No - Continue assessment No - Continue assessment No - Continue assessment     What is the highest level of mobility based on the progressive mobility assessment? Level 3 (Stands with assist) - Balance while standing  and cannot march in place Level 3 (Stands with assist) - Balance while standing  and cannot march in place Level 3 (Stands with assist) - Balance while standing  and cannot march in place     Is the above level different from baseline mobility prior to current illness? Yes - Recommend PT order Yes - Recommend PT order Yes - Recommend PT order              Barriers to discharge: none Disposition Plan:  Home HH orders placed: n/a Status is: Inpt  Objective: Blood pressure 133/66, pulse 93, temperature 98.6 F (37 C), temperature source Oral, resp. rate 17, height 5\' 3"  (1.6 m), weight 97.8 kg, SpO2 100%.  Examination:  Physical Exam Constitutional:      Appearance: Normal appearance.  HENT:     Head: Normocephalic and atraumatic.     Mouth/Throat:     Mouth: Mucous membranes are moist.  Eyes:     Extraocular Movements: Extraocular movements intact.  Cardiovascular:     Rate and Rhythm: Normal rate and regular rhythm.  Pulmonary:     Effort: Pulmonary effort is normal. No respiratory distress.     Breath sounds: Normal breath sounds. No wheezing.  Abdominal:     General: Bowel sounds are normal. There is no distension.     Palpations: Abdomen is soft.     Tenderness: There is  no abdominal tenderness.  Musculoskeletal:        General: Normal range of motion.     Cervical back: Normal range of motion and neck supple.  Skin:    General: Skin is warm and dry.  Neurological:     General: No focal deficit present.     Mental Status: She is alert.  Psychiatric:        Mood and Affect: Mood normal.      Consultants:  GI Cardiology  Procedures:    Data Reviewed: Results for orders placed or performed during the hospital encounter of 03/08/24 (from the past 24 hours)  Hemoglobin and hematocrit, blood     Status: Abnormal   Collection Time: 03/09/24  4:46 PM  Result Value Ref Range   Hemoglobin 7.5 (L) 12.0 - 15.0 g/dL   HCT 16.1 (L) 09.6 - 04.5 %  Glucose, capillary     Status: Abnormal   Collection Time: 03/09/24  5:00 PM  Result Value  Ref Range   Glucose-Capillary 154 (H) 70 - 99 mg/dL  Glucose, capillary     Status: Abnormal   Collection Time: 03/09/24  9:19 PM  Result Value Ref Range   Glucose-Capillary 150 (H) 70 - 99 mg/dL  Glucose, capillary     Status: None   Collection Time: 03/10/24  5:51 AM  Result Value Ref Range   Glucose-Capillary 91 70 - 99 mg/dL  Glucose, capillary     Status: None   Collection Time: 03/10/24  7:36 AM  Result Value Ref Range   Glucose-Capillary 88 70 - 99 mg/dL  Hemoglobin and hematocrit, blood     Status: Abnormal   Collection Time: 03/10/24  9:10 AM  Result Value Ref Range   Hemoglobin 7.0 (L) 12.0 - 15.0 g/dL   HCT 60.4 (L) 54.0 - 98.1 %  Basic metabolic panel with GFR     Status: Abnormal   Collection Time: 03/10/24  9:10 AM  Result Value Ref Range   Sodium 139 135 - 145 mmol/L   Potassium 3.9 3.5 - 5.1 mmol/L   Chloride 108 98 - 111 mmol/L   CO2 23 22 - 32 mmol/L   Glucose, Bld 89 70 - 99 mg/dL   BUN 59 (H) 8 - 23 mg/dL   Creatinine, Ser 1.91 (H) 0.44 - 1.00 mg/dL   Calcium  8.5 (L) 8.9 - 10.3 mg/dL   GFR, Estimated 40 (L) >60 mL/min   Anion gap 8 5 - 15  Magnesium      Status: None   Collection  Time: 03/10/24  9:10 AM  Result Value Ref Range   Magnesium  2.3 1.7 - 2.4 mg/dL  Glucose, capillary     Status: Abnormal   Collection Time: 03/10/24 11:11 AM  Result Value Ref Range   Glucose-Capillary 110 (H) 70 - 99 mg/dL  Prepare RBC (crossmatch)     Status: None   Collection Time: 03/10/24  1:00 PM  Result Value Ref Range   Order Confirmation      ORDER PROCESSED BY BLOOD BANK Performed at [facility], [address], [place], [place] [address]    *Note: Due to a large number of results and/or encounters for the requested time period, some results have not been displayed. A complete set of results can be found in Results Review.    I have reviewed pertinent nursing notes, vitals, labs, and images as necessary. I have ordered labwork to follow up on as indicated.  I have reviewed the last notes from staff over past 24 hours. I have discussed patient's care plan and test results with nursing staff, CM/SW, and other staff as appropriate.  Time spent: Greater than 50% of the 55 minute visit was spent in counseling/coordination of care for the patient as laid out in the A&P.   LOS: 2 days   Faith Homes, MD [facility] 03/10/2024, 2:47 PM

## 2024-03-10 NOTE — Plan of Care (Signed)
   Problem: Education: Goal: Knowledge of General Education information will improve Description Including pain rating scale, medication(s)/side effects and non-pharmacologic comfort measures Outcome: Progressing   Problem: Health Behavior/Discharge Planning: Goal: Ability to manage health-related needs will improve Outcome: Progressing

## 2024-03-10 NOTE — Progress Notes (Signed)
 Subjective: No complaints of chest pain, but she complains about lower back and leg pain.  This is not new.  Objective: Vital signs in last 24 hours: Temp:  [97.7 F (36.5 C)-98.2 F (36.8 C)] 98.2 F (36.8 C) (04/27 0410) Pulse Rate:  [80-88] 88 (04/27 0951) Resp:  [17-20] 17 (04/27 0410) BP: (107-144)/(42-66) 144/66 (04/27 0951) SpO2:  [100 %] 100 % (04/27 0912) Last BM Date : 03/09/24 (pt states last dark stool was early morning 4/26)  Intake/Output from previous day: 04/26 0701 - 04/27 0700 In: 1999 [P.O.:325; I.V.:1674] Out: 2200 [Urine:2200] Intake/Output this shift: No intake/output data recorded.  General appearance: alert and no distress GI: soft, non-tender; bowel sounds normal; no masses,  no organomegaly  Lab Results: Recent Labs    03/08/24 1828 03/09/24 0209 03/09/24 0514 03/09/24 1238 03/09/24 1646 03/10/24 0910  WBC 12.3*  --  11.1*  --   --   --   HGB 5.3*   < > 7.4* 7.6* 7.5* 7.0*  HCT 17.9*   < > 23.1* 23.1* 23.2* 22.5*  PLT 218  --  222  --   --   --    < > = values in this interval not displayed.   BMET Recent Labs    03/08/24 1828 03/09/24 0514 03/10/24 0910  NA 138 141 139  K 4.2 4.2 3.9  CL 99 103 108  CO2 25 29 23   GLUCOSE 220* 177* 89  BUN 93* 93* 59*  CREATININE 2.32* 2.31* 1.48*  CALCIUM  9.2 8.9 8.5*   LFT Recent Labs    03/09/24 0514  PROT 5.3*  ALBUMIN  2.6*  AST 20  ALT 11  ALKPHOS 54  BILITOT 0.7   PT/INR Recent Labs    03/09/24 0514  LABPROT 16.1*  INR 1.3*   Hepatitis Panel No results for input(s): "HEPBSAG", "HCVAB", "HEPAIGM", "HEPBIGM" in the last 72 hours. C-Diff No results for input(s): "CDIFFTOX" in the last 72 hours. Fecal Lactopherrin No results for input(s): "FECLLACTOFRN" in the last 72 hours.  Studies/Results: DG Chest Port 1 View Result Date: 03/08/2024 CLINICAL DATA:  chest pain EXAM: PORTABLE CHEST 1 VIEW COMPARISON:  Chest x-ray 01/03/2024, CT chest 02/14/2023 FINDINGS: The heart and  mediastinal contours are unchanged. Atherosclerotic plaque. Bibasilar atelectasis. No focal consolidation. No pulmonary edema. No pleural effusion. No pneumothorax. No acute osseous abnormality. IMPRESSION: 1. No active disease. 2.  Aortic Atherosclerosis (ICD10-I70.0). Electronically Signed   By: Morgane  Naveau M.D.   On: 03/08/2024 18:48    Medications: Scheduled:  sodium chloride    Intravenous Once   allopurinol   150 mg Oral Daily   buPROPion   300 mg Oral q morning   insulin  aspart  0-5 Units Subcutaneous QHS   insulin  aspart  0-9 Units Subcutaneous TID WC   levothyroxine   50 mcg Oral Q0600   metoprolol  succinate  25 mg Oral Daily   mometasone -formoterol   2 puff Inhalation BID   pantoprazole  (PROTONIX ) IV  40 mg Intravenous Q12H   rosuvastatin   40 mg Oral Daily   sodium chloride  flush  3 mL Intravenous Q12H   sodium chloride  flush  3 mL Intravenous Q12H   Continuous:  sodium chloride  75 mL (03/10/24 0955)    Assessment/Plan: 1) Symptomatic anemia. 2) Chest pain.   The patient was evaluated by anesthesia today and they were concerned about a potential cardiac source for the chest pain.  Her EGD was cancelled pending a cardiology consultation.  No new HGB this AM, but she is stable.  Plan: 1) Monitor HGB. 2) EGD when cleared by cardiology. 3) Clear liquid diet. 4) [facility] will assume care in the AM.  LOS: 2 days   Jacquise Rarick D 03/10/2024, 12:54 PM

## 2024-03-11 ENCOUNTER — Inpatient Hospital Stay (HOSPITAL_COMMUNITY)

## 2024-03-11 DIAGNOSIS — R079 Chest pain, unspecified: Secondary | ICD-10-CM

## 2024-03-11 DIAGNOSIS — I2489 Other forms of acute ischemic heart disease: Secondary | ICD-10-CM | POA: Diagnosis not present

## 2024-03-11 DIAGNOSIS — D62 Acute posthemorrhagic anemia: Secondary | ICD-10-CM | POA: Diagnosis not present

## 2024-03-11 DIAGNOSIS — K922 Gastrointestinal hemorrhage, unspecified: Secondary | ICD-10-CM | POA: Diagnosis not present

## 2024-03-11 DIAGNOSIS — K921 Melena: Secondary | ICD-10-CM | POA: Diagnosis not present

## 2024-03-11 DIAGNOSIS — R7989 Other specified abnormal findings of blood chemistry: Secondary | ICD-10-CM | POA: Diagnosis not present

## 2024-03-11 DIAGNOSIS — I480 Paroxysmal atrial fibrillation: Secondary | ICD-10-CM | POA: Diagnosis not present

## 2024-03-11 DIAGNOSIS — Z7901 Long term (current) use of anticoagulants: Secondary | ICD-10-CM

## 2024-03-11 DIAGNOSIS — R195 Other fecal abnormalities: Secondary | ICD-10-CM

## 2024-03-11 LAB — TYPE AND SCREEN
ABO/RH(D): A POS
Antibody Screen: NEGATIVE
Unit division: 0
Unit division: 0
Unit division: 0

## 2024-03-11 LAB — HEMOGLOBIN AND HEMATOCRIT, BLOOD
HCT: 22.8 % — ABNORMAL LOW (ref 36.0–46.0)
HCT: 24.9 % — ABNORMAL LOW (ref 36.0–46.0)
HCT: 26.8 % — ABNORMAL LOW (ref 36.0–46.0)
Hemoglobin: 7.2 g/dL — ABNORMAL LOW (ref 12.0–15.0)
Hemoglobin: 8 g/dL — ABNORMAL LOW (ref 12.0–15.0)
Hemoglobin: 8.7 g/dL — ABNORMAL LOW (ref 12.0–15.0)

## 2024-03-11 LAB — ECHOCARDIOGRAM COMPLETE
Area-P 1/2: 4.39 cm2
Calc EF: 30.9 %
Height: 63 in
MV M vel: 5.47 m/s
MV Peak grad: 119.7 mmHg
Radius: 0.5 cm
S' Lateral: 5.3 cm
Single Plane A2C EF: 34.8 %
Single Plane A4C EF: 31.6 %
Weight: 3449.6 [oz_av]

## 2024-03-11 LAB — BASIC METABOLIC PANEL WITH GFR
Anion gap: 9 (ref 5–15)
BUN: 45 mg/dL — ABNORMAL HIGH (ref 8–23)
CO2: 23 mmol/L (ref 22–32)
Calcium: 8.5 mg/dL — ABNORMAL LOW (ref 8.9–10.3)
Chloride: 104 mmol/L (ref 98–111)
Creatinine, Ser: 1.5 mg/dL — ABNORMAL HIGH (ref 0.44–1.00)
GFR, Estimated: 39 mL/min — ABNORMAL LOW (ref >60)
Glucose, Bld: 134 mg/dL — ABNORMAL HIGH (ref 70–99)
Potassium: 3.8 mmol/L (ref 3.5–5.1)
Sodium: 136 mmol/L (ref 135–145)

## 2024-03-11 LAB — BPAM RBC
Blood Product Expiration Date: 202505192359
ISSUE DATE / TIME: 202504252128
ISSUE DATE / TIME: 202504260034
ISSUE DATE / TIME: 202504271400
ISSUE DATE / TIME: 202505192359
ISSUE DATE / TIME: 202505192359
Unit Type and Rh: 202505192359
Unit Type and Rh: 202505192359
Unit Type and Rh: 202505192359
Unit Type and Rh: 6200
Unit Type and Rh: 6200
Unit Type and Rh: 6200

## 2024-03-11 LAB — MAGNESIUM: Magnesium: 2.1 mg/dL (ref 1.7–2.4)

## 2024-03-11 LAB — GLUCOSE, CAPILLARY
Glucose-Capillary: 152 mg/dL — ABNORMAL HIGH (ref 70–99)
Glucose-Capillary: 127 mg/dL — ABNORMAL HIGH (ref 70–99)
Glucose-Capillary: 101 mg/dL — ABNORMAL HIGH (ref 70–99)
Glucose-Capillary: 208 mg/dL — ABNORMAL HIGH (ref 70–99)

## 2024-03-11 MED ORDER — SODIUM CHLORIDE 0.9 % IV BOLUS
1000.0000 mL | Freq: Once | INTRAVENOUS | Status: AC
  Administered 2024-03-11: 1000 mL via INTRAVENOUS

## 2024-03-11 MED ORDER — SODIUM CHLORIDE 0.9 % IV BOLUS
250.0000 mL | Freq: Once | INTRAVENOUS | Status: AC
  Administered 2024-03-12: 250 mL via INTRAVENOUS

## 2024-03-11 MED ORDER — AMIODARONE HCL IN DEXTROSE 360-4.14 MG/200ML-% IV SOLN
30.0000 mg/h | INTRAVENOUS | Status: DC
  Administered 2024-03-12 – 2024-03-15 (×8): 30 mg/h via INTRAVENOUS
  Filled 2024-03-11 (×8): qty 200

## 2024-03-11 MED ORDER — POTASSIUM CHLORIDE 20 MEQ PO PACK
40.0000 meq | PACK | Freq: Once | ORAL | Status: AC
  Administered 2024-03-12: 40 meq via ORAL
  Filled 2024-03-11: qty 2

## 2024-03-11 MED ORDER — PERFLUTREN LIPID MICROSPHERE
1.0000 mL | INTRAVENOUS | Status: AC | PRN
  Administered 2024-03-11: 2 mL via INTRAVENOUS

## 2024-03-11 MED ORDER — METOPROLOL TARTRATE 5 MG/5ML IV SOLN
5.0000 mg | INTRAVENOUS | Status: DC | PRN
  Administered 2024-03-11 (×2): 5 mg via INTRAVENOUS
  Filled 2024-03-11 (×3): qty 5

## 2024-03-11 MED ORDER — AMIODARONE LOAD VIA INFUSION
150.0000 mg | Freq: Once | INTRAVENOUS | Status: AC
  Administered 2024-03-12: 150 mg via INTRAVENOUS
  Filled 2024-03-11: qty 83.34

## 2024-03-11 MED ORDER — AMIODARONE HCL IN DEXTROSE 360-4.14 MG/200ML-% IV SOLN
60.0000 mg/h | INTRAVENOUS | Status: DC
  Administered 2024-03-12: 60 mg/h via INTRAVENOUS
  Filled 2024-03-11: qty 200

## 2024-03-11 MED ORDER — DIGOXIN 0.25 MG/ML IJ SOLN
0.2500 mg | Freq: Once | INTRAMUSCULAR | Status: AC
  Administered 2024-03-11: 0.25 mg via INTRAVENOUS
  Filled 2024-03-11: qty 1

## 2024-03-11 NOTE — Progress Notes (Signed)
 Patient went into Afib rvr. Provider alerted and an EKG done and in chart. BP 94/66. Orders for 1L bolus and 5 of metoprolol  ordered and given.

## 2024-03-11 NOTE — Progress Notes (Signed)
 Daily Progress Note  DOA: 03/08/2024 Hospital Day: 4   Chief Complaint: anemia, FOBT+  ASSESSMENT    63 y.o. year old female with multiple medical problems not limited to CAD, HFrEF, PAD, CKD stage 5, PAF, COPD, hypertension,  diabetes, GERD.  Patient admitted for 25 after presenting to the ED with chest pain.  She was found to be anemic and FOBT+.  She complained of dysphagia.  See GI consult from 4/26  Symptomatic anemia Melena on rectal exam in ED Today:  Hgb has improved from 6.2 to 8 with 3 u RBCs. Reports having had a few polyps removed at time of last colonoscopy about 5 years ago in [facility].   AFIB, takes Eliquis  at home ( on hold) Elevated troponin Chest pain Acute on chronic CHF Cardiology following. Got echo today and if no significant changes then should be able to proceed with EGD.   GERD Dysphagia  PAF Eliquis  is on hold.   Chronic constipation Doesn't routinely take anything at home. Briefly discussed Miralax   **ETOH cirrhosis listed in PMH but cannot confirm this. Cirrhosis not reported on CT scan, platelets normal. INR mildly elevated but on Eliquis   See PMH for additional medical problems   Principal Problem:   GI bleed Active Problems:   Insulin  dependent type 2 diabetes mellitus ([name])   Hypothyroidism   S/P peripheral artery angioplasty - TurboHawk atherectomy; R SFA   Paroxysmal atrial fibrillation ([name])   Carotid artery stenosis   Symptomatic anemia   Demand ischemia ([name])   Chronic sinus bradycardia   Peripheral artery disease ([name])   Hyperlipidemia   COPD (chronic obstructive pulmonary disease) ([name])   HFrEF (heart failure with reduced ejection fraction) ([name])   History of CAD (coronary artery disease)   CKD (chronic kidney disease), stage IV ([name])   Continuous dependence on cigarette smoking   GAD (generalized anxiety disorder)   PLAN   --Got echo today and if no significant changes then should be able to proceed with EGD  in am. Will make NPO after MN and await Cardiology's evaluation of echo results --Pantoprazole   40 mg BID --Trend H/H  Subjective   No BMs / overt GI bleeding today.   Objective    Recent Labs    03/08/24 1828 03/09/24 0209 03/09/24 0514 03/09/24 1238 03/10/24 0910 03/10/24 1806 03/11/24 0518  WBC 12.3*  --  11.1*  --   --   --   --   HGB 5.3*   < > 7.4*   < > 7.0* 8.8* 8.0*  HCT 17.9*   < > 23.1*   < > 22.5* 26.8* 24.9*  MCV 105.9*  --  97.5  --   --   --   --   PLT 218  --  222  --   --   --   --    < > = values in this interval not displayed.   No results for input(s): "FOLATE", "VITAMINB12", "FERRITIN", "TIBC", "IRONPCTSAT" in the last 72 hours. Recent Labs    03/09/24 0514 03/10/24 0910 03/11/24 0518  NA 141 139 136  K 4.2 3.9 3.8  CL 103 108 104  CO2 29 23 23   GLUCOSE 177* 89 134*  BUN 93* 59* 45*  CREATININE 2.31* 1.48* 1.50*  CALCIUM  8.9 8.5* 8.5*   Recent Labs    03/08/24 1828 03/09/24 0514  PROT 6.3* 5.3*  ALBUMIN  3.0* 2.6*  AST 19 20  ALT 13 11  ALKPHOS 66  54  BILITOT 0.5 0.7   Recent Labs    03/09/24 0514  INR 1.3*  Imaging:  ECHOCARDIOGRAM COMPLETE    ECHOCARDIOGRAM REPORT       Patient Name:   TANEEKA HARDEMAN Date of Exam: 03/11/2024 Medical Rec #:  [id number]     Height:       63.0 in Accession #:    [id number]    Weight:       215.6 lb Date of Birth:  03-28-1961    BSA:          1.996 m Patient Age:    62 years      BP:           131/65 mmHg Patient Gender: F             HR:           86 bpm. Exam Location:  Inpatient  Procedure: 2D Echo, Cardiac Doppler, Color Doppler and Intracardiac            Opacification Agent (Both Spectral and Color Flow Doppler were            utilized during procedure).  Indications:    R07.9* Chest pain, unspecified   History:        Patient has prior history of Echocardiogram examinations, most                 recent 09/27/2023. CHF, CAD, Abnormal ECG, COPD,                 Arrythmias:Atrial  Fibrillation, Signs/Symptoms:Dyspnea and                 Shortness of Breath; Risk Factors:Diabetes, Dyslipidemia,                 Current Smoker and Sleep Apnea. ETOH. Substance abuse.   Sonographer:    Raynelle Callow RDCS Referring Phys: [id number] Awilda Bogus WEAVER    Sonographer Comments: Technically difficult study due to poor echo windows, patient is obese and suboptimal parasternal window. Image acquisition challenging due to patient body habitus. IMPRESSIONS   1. Left ventricular ejection fraction, by estimation, is 35 to 40%. The left ventricle has moderately decreased function. The left ventricle demonstrates global hypokinesis. The left ventricular internal cavity size was upper limit of normal. Left  ventricular diastolic parameters are consistent with Grade I diastolic dysfunction (impaired relaxation).  2. Right ventricular systolic function is mildly reduced. The right ventricular size is normal. There is moderately elevated pulmonary artery systolic pressure. The estimated right ventricular systolic pressure is 55.9 mmHg.  3. The mitral valve is degenerative. Moderate mitral valve regurgitation. No evidence of mitral stenosis.  4. The aortic valve was not well visualized. Aortic valve regurgitation is not visualized. No aortic stenosis is present.  5. The inferior vena cava is dilated in size with >50% respiratory variability, suggesting right atrial pressure of 8 mmHg.  Comparison(s): A prior study was performed on 09/27/2023. Reduction in LVEF from 45-50% to 35-40% and RWMAs.  FINDINGS  Left Ventricle: Left ventricular ejection fraction, by estimation, is 35 to 40%. The left ventricle has moderately decreased function. The left ventricle demonstrates global hypokinesis. Definity  contrast agent was given IV to delineate the left  ventricular endocardial borders. The left ventricular internal cavity size was upper limit of normal. There is no left ventricular hypertrophy. Left ventricular  diastolic parameters are consistent with Grade I diastolic dysfunction (impaired relaxation).    LV Wall Scoring: The  mid inferoseptal segment, apical septal segment, apical anterior segment, apical inferior segment, and apex are hypokinetic.  Right Ventricle: The right ventricular size is normal. No increase in right ventricular wall thickness. Right ventricular systolic function is mildly reduced. There is moderately elevated pulmonary artery systolic pressure. The tricuspid regurgitant  velocity is 3.46 m/s, and with an assumed right atrial pressure of 8 mmHg, the estimated right ventricular systolic pressure is 55.9 mmHg.  Left Atrium: Left atrial size was normal in size.  Right Atrium: Right atrial size was normal in size.  Pericardium: There is no evidence of pericardial effusion.  Mitral Valve: The mitral valve is degenerative in appearance. Mild mitral annular calcification. Moderate mitral valve regurgitation, with centrally-directed jet. No evidence of mitral valve stenosis.  Tricuspid Valve: The tricuspid valve is grossly normal. Tricuspid valve regurgitation is mild . No evidence of tricuspid stenosis.  Aortic Valve: The aortic valve was not well visualized. Aortic valve regurgitation is not visualized. No aortic stenosis is present.  Pulmonic Valve: The pulmonic valve was not well visualized. Pulmonic valve regurgitation is not visualized. No evidence of pulmonic stenosis.  Aorta: The aortic root and ascending aorta are structurally normal, with no evidence of dilitation.  Venous: The inferior vena cava is dilated in size with greater than 50% respiratory variability, suggesting right atrial pressure of 8 mmHg.  IAS/Shunts: There is redundancy of the interatrial septum. The interatrial septum was not well visualized.    LEFT VENTRICLE PLAX 2D LVIDd:         5.80 cm      Diastology LVIDs:         5.30 cm      LV e' medial:    7.18 cm/s LV PW:         0.90 cm      LV  E/e' medial:  14.1 LV IVS:        1.10 cm      LV e' lateral:   10.60 cm/s LVOT diam:     2.30 cm      LV E/e' lateral: 9.5 LV SV:         64 LV SV Index:   32 LVOT Area:     4.15 cm   LV Volumes (MOD) LV vol d, MOD A2C: 187.0 ml LV vol d, MOD A4C: 155.0 ml LV vol s, MOD A2C: 122.0 ml LV vol s, MOD A4C: 106.0 ml LV SV MOD A2C:     65.0 ml LV SV MOD A4C:     155.0 ml LV SV MOD BP:      52.7 ml  RIGHT VENTRICLE            IVC RV S prime:     9.57 cm/s  IVC diam: 2.20 cm TAPSE (M-mode): 1.3 cm  LEFT ATRIUM             Index        RIGHT ATRIUM           Index LA diam:        3.20 cm 1.60 cm/m   RA Area:     12.30 cm LA Vol (A2C):   53.8 ml 26.95 ml/m  RA Volume:   24.90 ml  12.47 ml/m LA Vol (A4C):   35.8 ml 17.93 ml/m LA Biplane Vol: 45.1 ml 22.59 ml/m  AORTIC VALVE LVOT Vmax:   80.70 cm/s LVOT Vmean:  55.400 cm/s LVOT VTI:    0.153 m   AORTA Ao Root  diam: 3.30 cm Ao Asc diam:  3.40 cm  MITRAL VALVE                  TRICUSPID VALVE MV Area (PHT): 4.39 cm       TR Peak grad:   47.9 mmHg MV Decel Time: 173 msec       TR Vmax:        346.00 cm/s MR Peak grad:    119.7 mmHg MR Mean grad:    77.0 mmHg    SHUNTS MR Vmax:         547.00 cm/s  Systemic VTI:  0.15 m MR Vmean:        413.0 cm/s   Systemic Diam: 2.30 cm MR PISA:         1.57 cm MR PISA Eff ROA: 11 mm MR PISA Radius:  0.50 cm MV E velocity: 101.00 cm/s MV A velocity: 83.00 cm/s MV E/A ratio:  1.22  Sunit Tolia Electronically signed by Olinda Bertrand Signature Date/Time: 03/11/2024/1:31:54 PM      Final       Scheduled inpatient medications:   allopurinol   150 mg Oral Daily   buPROPion   300 mg Oral q morning   gabapentin   200 mg Oral TID   insulin  aspart  0-5 Units Subcutaneous QHS   insulin  aspart  0-9 Units Subcutaneous TID WC   levothyroxine   50 mcg Oral Q0600   metoprolol  succinate  25 mg Oral Daily   mometasone -formoterol   2 puff Inhalation BID   pantoprazole  (PROTONIX ) IV  40 mg  Intravenous Q12H   rosuvastatin   40 mg Oral Daily   sodium chloride  flush  3 mL Intravenous Q12H   sodium chloride  flush  3 mL Intravenous Q12H   Continuous inpatient infusions:  PRN inpatient medications: acetaminophen  **OR** acetaminophen , albuterol , dextrose , melatonin, methocarbamol , Muscle Rub, ondansetron  **OR** ondansetron  (ZOFRAN ) IV, sodium chloride  flush  Vital signs in last 24 hours: Temp:  [98.6 F (37 C)-99.1 F (37.3 C)] 99.1 F (37.3 C) (04/28 1132) Pulse Rate:  [87-106] 87 (04/28 1132) Resp:  [16-20] 16 (04/28 1132) BP: (111-134)/(63-98) 127/63 (04/28 1132) SpO2:  [95 %-100 %] 97 % (04/28 1132) Last BM Date : 03/09/24  Intake/Output Summary (Last 24 hours) at 03/11/2024 1600 Last data filed at 03/11/2024 1349 Gross per 24 hour  Intake 382.5 ml  Output 2500 ml  Net -2117.5 ml    Intake/Output from previous day: 04/27 0701 - 04/28 0700 In: 862.5 [P.O.:480; Blood:382.5] Out: 1200 [Urine:1200] Intake/Output this shift: Total I/O In: -  Out: 1300 [Urine:1300]   Physical Exam:  General: Alert female in NAD Heart:  Regular rate .  Pulmonary: Normal respiratory effort Abdomen: Soft, nondistended, nontender. Normal bowel sounds. Neurologic: Alert and oriented Psych: Pleasant. Cooperative     LOS: 3 days   Mai Schwalbe ,NP 03/11/2024, 4:00 PM

## 2024-03-11 NOTE — Progress Notes (Signed)
 Cardiologist:  Camnits/McLean  Subjective:   No complaints this am  Objective:  Vitals:   03/10/24 1615 03/10/24 1650 03/10/24 1915 03/11/24 0415  BP: 128/64 (!) 111/98 134/67 114/65  Pulse: 94 94 (!) 106 89  Resp:  18 20   Temp:  99 F (37.2 C) 98.8 F (37.1 C) 98.6 F (37 C)  TempSrc:  Oral Oral Oral  SpO2: 100% 100% 100% 95%  Weight:      Height:        Intake/Output from previous day:  Intake/Output Summary (Last 24 hours) at 03/11/2024 0754 Last data filed at 03/10/2024 1900 Gross per 24 hour  Intake 862.5 ml  Output 1200 ml  Net -337.5 ml    Physical Exam:  Pale female Edentulous Prior left CEA No murmur Lungs clear Abdomen benign Trace edema Decreased peripheral pulses 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   Lab Results: Basic Metabolic Panel: Recent Labs    03/10/24 0910 03/11/24 0518  NA 139 136  K 3.9 3.8  CL 108 104  CO2 23 23  GLUCOSE 89 134*  BUN 59* 45*  CREATININE 1.48* 1.50*  CALCIUM  8.5* 8.5*  MG 2.3 2.1   Liver Function Tests: Recent Labs    03/08/24 1828 03/09/24 0514  AST 19 20  ALT 13 11  ALKPHOS 66 54  BILITOT 0.5 0.7  PROT 6.3* 5.3*  ALBUMIN  3.0* 2.6*   No results for input(s): "LIPASE", "AMYLASE" in the last 72 hours. CBC: Recent Labs    03/08/24 1828 03/09/24 0209 03/09/24 0514 03/09/24 1238 03/10/24 1806 03/11/24 0518  WBC 12.3*  --  11.1*  --   --   --   NEUTROABS 9.5*  --   --   --   --   --   HGB 5.3*   < > 7.4*   < > 8.8* 8.0*  HCT 17.9*   < > 23.1*   < > 26.8* 24.9*  MCV 105.9*  --  97.5  --   --   --   PLT 218  --  222  --   --   --    < > = values in this interval not displayed.     Imaging: No results found.  Cardiac Studies:  ECG: afib rate 99 nonspecific ST changes LVH likely    Telemetry:  NSR this am  Echo: EF 45-50% mild LAE mild MR 09/27/23   Medications:    allopurinol   150 mg Oral Daily   buPROPion   300 mg Oral q morning   gabapentin   200  mg Oral TID   insulin  aspart  0-5 Units Subcutaneous QHS   insulin  aspart  0-9 Units Subcutaneous TID WC   levothyroxine   50 mcg Oral Q0600   metoprolol  succinate  25 mg Oral Daily   mometasone -formoterol   2 puff Inhalation BID   pantoprazole  (PROTONIX ) IV  40 mg Intravenous Q12H   rosuvastatin   40 mg Oral Daily   sodium chloride  flush  3 mL Intravenous Q12H   sodium chloride  flush  3 mL Intravenous Q12H      Assessment/Plan:  63 y.o. with history of CHF, PAF, CAD PVD with right transmet amputation and left fempop fifth toe amputation, left fem pop left CEA HTN, HLD DM poorly controlled and prior ETOH/cocaine abuse Admitted with GI bleed and dyspnea with chest pain troponin 106 and BNP 666  Troponin elevation:  No acute ECG changes history of small  OM dx Rx medically no indication for cath continue lopressor  update TTE for EF and RWMA CHF:  stable volume CXR NAD BNP minimally elevated updated TTE pending hydralazine  and demadex  held in setting of GI bleed Anemia Hct 24.9 this am seen by Dr Nickey Barn GI for EGD  PAF:  eliquis  on hold in NSR this am continue beta blocker  PVD: with significant LA dx post left fem pop right PCI and left CEA   [name] 03/11/2024, 7:54 AM

## 2024-03-11 NOTE — Progress Notes (Signed)
 Progress Note    [name]   WUJ:[id number]  DOB: [birth year]  DOA: 03/08/2024     3 PCP: Karla Snare, NP  Initial CC: dark stool, chest pain  Hospital Course: [name] is a 63 yo female with PMH CAD, HFrEF, dCHF, CAD, CKDIV, carotid stenosis s/p endarterectomy, PAF, PAD, COPD, ongoing tobacco use, chronic bradycardia, gout, HTN, DM II, HLD, vitamin D  deficiency who presented with chest pain complaints.  She was given nitroglycerin  by EMS.  She also was complaining of dark stools.  She states she does take BC powder approximately 1-2 times per month but denied any more frequent.  ED Course:  At presentation to ED patient is borderline hypotensive blood pressure 110/41, tachycardic 99-108.  O2 sat 100% room air. Troponin 94>>106 CBC showing low hemoglobin 5.3 (baseline hemoglobin around 10-11.  Elevated WBC count 12.3 for last 2 months.  Normal platelet count. CMP showing creatinine 2.32 and GFR 23.  Renal function at baseline.  Otherwise unremarkable CMP. Elevated BNP 666. EKG showing atrial fibrillation heart rate 99, left ventricular hypertrophy pattern. Chest ray showing no acute disease process.  Aortic atherosclerosis.   Dr.Yao from bedside rectal exam which showed patient has melena colicky stool in the rectal vault.   In the ED 2 unit blood transfusion has been ordered.  And patient received IV Protonix  80 mg.  She was admitted for further GI bleed workup.  Interval History:  No bleeding or further bowel movements.  Denies chest pain or shortness of breath this morning. Hemoglobin holding stable after blood transfusion yesterday. Still pending EGD contingent on cardiology clearance.   Assessment and Plan: * GI bleed - Potential for upper GIB given eliquis  use and intermittent use of BC powder -Initial hemoglobin 5.3 g/dL with baseline around 11 g/dL - She received 2 units PRBC on admission and repeat hemoglobin 4/26, 7.6 g/dL - GI consulted on admission for  potential EGD, follow up evaluation - Anesthesia requesting cardiology evaluation.  EGD postponed until then - continue trending H/H and will transfuse further if necessary - continue protonix   - Hgb target > 8 g/dL (see demand ischemia below) - Hgb stable this morning, 8 g/dL.  Will continue trending  Paroxysmal atrial fibrillation ([name]) - Eliquis  on hold for now -Continue Toprol   Demand ischemia ([name]) - Presented with chest pain complaints and some shortness of breath - Troponins elevated some on admission 94>>106 - EKG noted with afib but no alarming signs of ischemia - case initially discussed case with on-call cardiology. After anesthesia evaluation, request for formal cardiology consult for clearance prior to EGD - appreciate cardiology assistance; agrees to be acceptable risk for EGD; if continued CP/angina then may need more ischemic workup - will plan to target Hgb > 8 g/dL in setting of underlying heart disease and risk factors - Echo performed: EF 35 to 40%, global hypokinesis, grade 1 DD.  Mild decrease in EF compared to 09/27/2023 echo; ischemic eval deferred to cardiology  CKD (chronic kidney disease), stage IV ([name]) - patient has history of CKD4. Baseline creat ~ 2.2, eGFR~ 25   Chest pain-resolved as of 03/09/2024 - see demand ischemia  HFrEF (heart failure with reduced ejection fraction) ([name]) - no s/s exacerbation  - Last echo reviewed from 09/27/2023: EF 45 to 50%, RWMA, mild LVH, grade 2 DD - New echo this admission now showing further decreased EF, 35 to 40%, grade 1 DD, global hypokinesis -Follow-up cardiology evaluation  Symptomatic anemia - see GIB  Insulin   dependent type 2 diabetes mellitus ([name]) - Last A1c 12.6% on 01/23/2024 - Continue on SSI and CBG monitoring for now  Peripheral artery disease ([name]) - Eliquis  on hold - Continue Crestor   COPD (chronic obstructive pulmonary disease) ([name]) - No signs/symptoms of exacerbation  Hyperlipidemia -  Continue Crestor   Hypothyroidism - Continue Synthroid   GAD (generalized anxiety disorder) - Continue Wellbutrin   History of CAD (coronary artery disease) - Eliquis  on hold - Continue statin, Toprol    Old records reviewed in assessment of this patient  Antimicrobials:   DVT prophylaxis:  SCDs Start: 03/08/24 2023 Place and maintain sequential compression device Start: 03/08/24 2023 Place TED hose Start: 03/08/24 2023   Code Status:   Code Status: Full Code  Mobility Assessment (Last 72 Hours)     Mobility Assessment     Row Name 03/11/24 0800 03/10/24 2200 03/10/24 0800 03/09/24 2000 03/09/24 1400   Does patient have an order for bedrest or is patient medically unstable No - Continue assessment Yes- Bedfast (Level 1) - Complete Yes- Bedfast (Level 1) - Complete No - Continue assessment No - Continue assessment   What is the highest level of mobility based on the progressive mobility assessment? Level 3 (Stands with assist) - Balance while standing  and cannot march in place Level 3 (Stands with assist) - Balance while standing  and cannot march in place Level 3 (Stands with assist) - Balance while standing  and cannot march in place Level 3 (Stands with assist) - Balance while standing  and cannot march in place Level 3 (Stands with assist) - Balance while standing  and cannot march in place   Is the above level different from baseline mobility prior to current illness? Yes - Recommend PT order Yes - Recommend PT order Yes - Recommend PT order Yes - Recommend PT order Yes - Recommend PT order    Row Name 03/09/24 1101           Does patient have an order for bedrest or is patient medically unstable No - Continue assessment       What is the highest level of mobility based on the progressive mobility assessment? Level 3 (Stands with assist) - Balance while standing  and cannot march in place       Is the above level different from baseline mobility prior to current illness?  Yes - Recommend PT order                Barriers to discharge: none Disposition Plan:  Home HH orders placed: n/a Status is: Inpt  Objective: Blood pressure 127/63, pulse 87, temperature 99.1 F (37.3 C), temperature source Oral, resp. rate 16, height 5\' 3"  (1.6 m), weight 97.8 kg, SpO2 97%.  Examination:  Physical Exam Constitutional:      Appearance: Normal appearance.  HENT:     Head: Normocephalic and atraumatic.     Mouth/Throat:     Mouth: Mucous membranes are moist.  Eyes:     Extraocular Movements: Extraocular movements intact.  Cardiovascular:     Rate and Rhythm: Normal rate and regular rhythm.  Pulmonary:     Effort: Pulmonary effort is normal. No respiratory distress.     Breath sounds: Normal breath sounds. No wheezing.  Abdominal:     General: Bowel sounds are normal. There is no distension.     Palpations: Abdomen is soft.     Tenderness: There is no abdominal tenderness.  Musculoskeletal:        General:  Normal range of motion.     Cervical back: Normal range of motion and neck supple.  Skin:    General: Skin is warm and dry.  Neurological:     General: No focal deficit present.     Mental Status: She is alert.  Psychiatric:        Mood and Affect: Mood normal.      Consultants:  GI Cardiology  Procedures:    Data Reviewed: Results for orders placed or performed during the hospital encounter of 03/08/24 (from the past 24 hours)  Glucose, capillary     Status: Abnormal   Collection Time: 03/10/24  4:14 PM  Result Value Ref Range   Glucose-Capillary 259 (H) 70 - 99 mg/dL  Hemoglobin and hematocrit, blood     Status: Abnormal   Collection Time: 03/10/24  6:06 PM  Result Value Ref Range   Hemoglobin 8.8 (L) 12.0 - 15.0 g/dL   HCT 09.8 (L) 11.9 - 14.7 %  Glucose, capillary     Status: Abnormal   Collection Time: 03/10/24 10:06 PM  Result Value Ref Range   Glucose-Capillary 200 (H) 70 - 99 mg/dL  Hemoglobin and hematocrit, blood      Status: Abnormal   Collection Time: 03/11/24  5:18 AM  Result Value Ref Range   Hemoglobin 8.0 (L) 12.0 - 15.0 g/dL   HCT 82.9 (L) 56.2 - 13.0 %  Magnesium      Status: None   Collection Time: 03/11/24  5:18 AM  Result Value Ref Range   Magnesium  2.1 1.7 - 2.4 mg/dL  Basic metabolic panel with GFR     Status: Abnormal   Collection Time: 03/11/24  5:18 AM  Result Value Ref Range   Sodium 136 135 - 145 mmol/L   Potassium 3.8 3.5 - 5.1 mmol/L   Chloride 104 98 - 111 mmol/L   CO2 23 22 - 32 mmol/L   Glucose, Bld 134 (H) 70 - 99 mg/dL   BUN 45 (H) 8 - 23 mg/dL   Creatinine, Ser 8.65 (H) 0.44 - 1.00 mg/dL   Calcium  8.5 (L) 8.9 - 10.3 mg/dL   GFR, Estimated 39 (L) >60 mL/min   Anion gap 9 5 - 15  Glucose, capillary     Status: Abnormal   Collection Time: 03/11/24  7:48 AM  Result Value Ref Range   Glucose-Capillary 152 (H) 70 - 99 mg/dL  Glucose, capillary     Status: Abnormal   Collection Time: 03/11/24 11:34 AM  Result Value Ref Range   Glucose-Capillary 127 (H) 70 - 99 mg/dL   *Note: Due to a large number of results and/or encounters for the requested time period, some results have not been displayed. A complete set of results can be found in Results Review.    I have reviewed pertinent nursing notes, vitals, labs, and images as necessary. I have ordered labwork to follow up on as indicated.  I have reviewed the last notes from staff over past 24 hours. I have discussed patient's care plan and test results with nursing staff, CM/SW, and other staff as appropriate.  Time spent: Greater than 50% of the 55 minute visit was spent in counseling/coordination of care for the patient as laid out in the A&P.   LOS: 3 days   Faith Homes, MD [facility] 03/11/2024, 1:45 PM

## 2024-03-11 NOTE — Progress Notes (Signed)
 Heart Failure Navigator Progress Note  Assessed for Heart & Vascular TOC clinic readiness.  Patient does not meet criteria due to Advanced Heart Failure Team patient of Dr. Shirlee Latch.   Navigator will sign off at this time.    [name], BSN, Scientist, clinical (histocompatibility and immunogenetics) Only

## 2024-03-12 DIAGNOSIS — D62 Acute posthemorrhagic anemia: Secondary | ICD-10-CM | POA: Diagnosis not present

## 2024-03-12 DIAGNOSIS — I480 Paroxysmal atrial fibrillation: Secondary | ICD-10-CM | POA: Diagnosis not present

## 2024-03-12 DIAGNOSIS — I2489 Other forms of acute ischemic heart disease: Secondary | ICD-10-CM | POA: Diagnosis not present

## 2024-03-12 DIAGNOSIS — R7989 Other specified abnormal findings of blood chemistry: Secondary | ICD-10-CM | POA: Diagnosis not present

## 2024-03-12 DIAGNOSIS — I5022 Chronic systolic (congestive) heart failure: Secondary | ICD-10-CM | POA: Diagnosis not present

## 2024-03-12 DIAGNOSIS — K921 Melena: Secondary | ICD-10-CM

## 2024-03-12 DIAGNOSIS — K922 Gastrointestinal hemorrhage, unspecified: Secondary | ICD-10-CM | POA: Diagnosis not present

## 2024-03-12 LAB — BASIC METABOLIC PANEL WITH GFR
Anion gap: 11 (ref 5–15)
BUN: 47 mg/dL — ABNORMAL HIGH (ref 8–23)
CO2: 17 mmol/L — ABNORMAL LOW (ref 22–32)
Calcium: 7.9 mg/dL — ABNORMAL LOW (ref 8.9–10.3)
Chloride: 105 mmol/L (ref 98–111)
Creatinine, Ser: 1.76 mg/dL — ABNORMAL HIGH (ref 0.44–1.00)
GFR, Estimated: 33 mL/min — ABNORMAL LOW (ref >60)
Glucose, Bld: 218 mg/dL — ABNORMAL HIGH (ref 70–99)
Potassium: 4.8 mmol/L (ref 3.5–5.1)
Sodium: 133 mmol/L — ABNORMAL LOW (ref 135–145)

## 2024-03-12 LAB — PREPARE RBC (CROSSMATCH)

## 2024-03-12 LAB — GLUCOSE, CAPILLARY
Glucose-Capillary: 201 mg/dL — ABNORMAL HIGH (ref 70–99)
Glucose-Capillary: 180 mg/dL — ABNORMAL HIGH (ref 70–99)
Glucose-Capillary: 253 mg/dL — ABNORMAL HIGH (ref 70–99)
Glucose-Capillary: 334 mg/dL — ABNORMAL HIGH (ref 70–99)

## 2024-03-12 LAB — MAGNESIUM: Magnesium: 2 mg/dL (ref 1.7–2.4)

## 2024-03-12 LAB — HEMOGLOBIN AND HEMATOCRIT, BLOOD
HCT: 22.7 % — ABNORMAL LOW (ref 36.0–46.0)
HCT: 27.9 % — ABNORMAL LOW (ref 36.0–46.0)
Hemoglobin: 7.4 g/dL — ABNORMAL LOW (ref 12.0–15.0)
Hemoglobin: 9 g/dL — ABNORMAL LOW (ref 12.0–15.0)

## 2024-03-12 MED ORDER — FUROSEMIDE 10 MG/ML IJ SOLN
20.0000 mg | Freq: Once | INTRAMUSCULAR | Status: AC
  Administered 2024-03-12: 20 mg via INTRAVENOUS
  Filled 2024-03-12: qty 2

## 2024-03-12 MED ORDER — POLYETHYLENE GLYCOL 3350 17 G PO PACK
17.0000 g | PACK | Freq: Every day | ORAL | Status: DC
  Administered 2024-03-12 – 2024-03-15 (×2): 17 g via ORAL
  Filled 2024-03-12 (×4): qty 1

## 2024-03-12 MED ORDER — DOCUSATE SODIUM 100 MG PO CAPS
200.0000 mg | ORAL_CAPSULE | Freq: Every day | ORAL | Status: DC
  Administered 2024-03-12 – 2024-03-16 (×5): 200 mg via ORAL
  Filled 2024-03-12 (×5): qty 2

## 2024-03-12 MED ORDER — SODIUM CHLORIDE 0.9% IV SOLUTION: Freq: Once | INTRAVENOUS | Status: AC

## 2024-03-12 NOTE — Progress Notes (Signed)
 Cardiologist:  Camnits/McLean  Subjective:   No complaints this am No BM 2 days Hct continues to fall Some PAF over night now NSR   Objective:  Vitals:   03/11/24 2222 03/12/24 0532 03/12/24 0959 03/12/24 1029  BP: (!) 90/42 (!) 95/58 132/65 118/67  Pulse: (!) 111 76 75   Resp: 16 18    Temp:  98.4 F (36.9 C)  97.7 F (36.5 C)  TempSrc:  Oral  Oral  SpO2:  96%    Weight:      Height:        Intake/Output from previous day:  Intake/Output Summary (Last 24 hours) at 03/12/2024 1101 Last data filed at 03/12/2024 0900 Gross per 24 hour  Intake 1469.38 ml  Output 600 ml  Net 869.38 ml    Physical Exam:  Pale female Edentulous Prior left CEA No murmur Lungs clear Abdomen benign Plus one bilateral  edema with stasis  Decreased peripheral pulses 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   Lab Results: Basic Metabolic Panel: Recent Labs    03/11/24 0518 03/12/24 0454  NA 136 133*  K 3.8 4.8  CL 104 105  CO2 23 17*  GLUCOSE 134* 218*  BUN 45* 47*  CREATININE 1.50* 1.76*  CALCIUM  8.5* 7.9*  MG 2.1 2.0   Liver Function Tests: No results for input(s): "AST", "ALT", "ALKPHOS", "BILITOT", "PROT", "ALBUMIN " in the last 72 hours.  No results for input(s): "LIPASE", "AMYLASE" in the last 72 hours. CBC: Recent Labs    03/11/24 2342 03/12/24 0454  HGB 7.2* 7.4*  HCT 22.8* 22.7*     Imaging: ECHOCARDIOGRAM COMPLETE Result Date: 03/11/2024    ECHOCARDIOGRAM REPORT   Patient Name:   CADANCE SCHUMAN Date of Exam: 03/11/2024 Medical Rec #:  [id number]     Height:       63.0 in Accession #:    [id number]    Weight:       215.6 lb Date of Birth:  07/02/1961    BSA:          1.996 m Patient Age:    62 years      BP:           131/65 mmHg Patient Gender: F             HR:           86 bpm. Exam Location:  Inpatient Procedure: 2D Echo, Cardiac Doppler, Color Doppler and Intracardiac            Opacification Agent (Both Spectral and Color Flow  Doppler were            utilized during procedure). Indications:    R07.9* Chest pain, unspecified  History:        Patient has prior history of Echocardiogram examinations, most                 recent 09/27/2023. CHF, CAD, Abnormal ECG, COPD,                 Arrythmias:Atrial Fibrillation, Signs/Symptoms:Dyspnea and                 Shortness of Breath; Risk Factors:Diabetes, Dyslipidemia,                 Current Smoker and Sleep Apnea. ETOH. Substance abuse.  Sonographer:    Raynelle Callow RDCS Referring Phys: [id number] Awilda Bogus WEAVER  Sonographer Comments: Technically difficult study due  to poor echo windows, patient is obese and suboptimal parasternal window. Image acquisition challenging due to patient body habitus. IMPRESSIONS  1. Left ventricular ejection fraction, by estimation, is 35 to 40%. The left ventricle has moderately decreased function. The left ventricle demonstrates global hypokinesis. The left ventricular internal cavity size was upper limit of normal. Left ventricular diastolic parameters are consistent with Grade I diastolic dysfunction (impaired relaxation).  2. Right ventricular systolic function is mildly reduced. The right ventricular size is normal. There is moderately elevated pulmonary artery systolic pressure. The estimated right ventricular systolic pressure is 55.9 mmHg.  3. The mitral valve is degenerative. Moderate mitral valve regurgitation. No evidence of mitral stenosis.  4. The aortic valve was not well visualized. Aortic valve regurgitation is not visualized. No aortic stenosis is present.  5. The inferior vena cava is dilated in size with >50% respiratory variability, suggesting right atrial pressure of 8 mmHg. Comparison(s): A prior study was performed on 09/27/2023. Reduction in LVEF from 45-50% to 35-40% and RWMAs. FINDINGS  Left Ventricle: Left ventricular ejection fraction, by estimation, is 35 to 40%. The left ventricle has moderately decreased function. The left ventricle  demonstrates global hypokinesis. Definity  contrast agent was given IV to delineate the left ventricular endocardial borders. The left ventricular internal cavity size was upper limit of normal. There is no left ventricular hypertrophy. Left ventricular diastolic parameters are consistent with Grade I diastolic dysfunction (impaired relaxation).  LV Wall Scoring: The mid inferoseptal segment, apical septal segment, apical anterior segment, apical inferior segment, and apex are hypokinetic. Right Ventricle: The right ventricular size is normal. No increase in right ventricular wall thickness. Right ventricular systolic function is mildly reduced. There is moderately elevated pulmonary artery systolic pressure. The tricuspid regurgitant velocity is 3.46 m/s, and with an assumed right atrial pressure of 8 mmHg, the estimated right ventricular systolic pressure is 55.9 mmHg. Left Atrium: Left atrial size was normal in size. Right Atrium: Right atrial size was normal in size. Pericardium: There is no evidence of pericardial effusion. Mitral Valve: The mitral valve is degenerative in appearance. Mild mitral annular calcification. Moderate mitral valve regurgitation, with centrally-directed jet. No evidence of mitral valve stenosis. Tricuspid Valve: The tricuspid valve is grossly normal. Tricuspid valve regurgitation is mild . No evidence of tricuspid stenosis. Aortic Valve: The aortic valve was not well visualized. Aortic valve regurgitation is not visualized. No aortic stenosis is present. Pulmonic Valve: The pulmonic valve was not well visualized. Pulmonic valve regurgitation is not visualized. No evidence of pulmonic stenosis. Aorta: The aortic root and ascending aorta are structurally normal, with no evidence of dilitation. Venous: The inferior vena cava is dilated in size with greater than 50% respiratory variability, suggesting right atrial pressure of 8 mmHg. IAS/Shunts: There is redundancy of the interatrial  septum. The interatrial septum was not well visualized.  LEFT VENTRICLE PLAX 2D LVIDd:         5.80 cm      Diastology LVIDs:         5.30 cm      LV e' medial:    7.18 cm/s LV PW:         0.90 cm      LV E/e' medial:  14.1 LV IVS:        1.10 cm      LV e' lateral:   10.60 cm/s LVOT diam:     2.30 cm      LV E/e' lateral: 9.5 LV SV:  64 LV SV Index:   32 LVOT Area:     4.15 cm  LV Volumes (MOD) LV vol d, MOD A2C: 187.0 ml LV vol d, MOD A4C: 155.0 ml LV vol s, MOD A2C: 122.0 ml LV vol s, MOD A4C: 106.0 ml LV SV MOD A2C:     65.0 ml LV SV MOD A4C:     155.0 ml LV SV MOD BP:      52.7 ml RIGHT VENTRICLE            IVC RV S prime:     9.57 cm/s  IVC diam: 2.20 cm TAPSE (M-mode): 1.3 cm LEFT ATRIUM             Index        RIGHT ATRIUM           Index LA diam:        3.20 cm 1.60 cm/m   RA Area:     12.30 cm LA Vol (A2C):   53.8 ml 26.95 ml/m  RA Volume:   24.90 ml  12.47 ml/m LA Vol (A4C):   35.8 ml 17.93 ml/m LA Biplane Vol: 45.1 ml 22.59 ml/m  AORTIC VALVE LVOT Vmax:   80.70 cm/s LVOT Vmean:  55.400 cm/s LVOT VTI:    0.153 m  AORTA Ao Root diam: 3.30 cm Ao Asc diam:  3.40 cm MITRAL VALVE                  TRICUSPID VALVE MV Area (PHT): 4.39 cm       TR Peak grad:   47.9 mmHg MV Decel Time: 173 msec       TR Vmax:        346.00 cm/s MR Peak grad:    119.7 mmHg MR Mean grad:    77.0 mmHg    SHUNTS MR Vmax:         547.00 cm/s  Systemic VTI:  0.15 m MR Vmean:        413.0 cm/s   Systemic Diam: 2.30 cm MR PISA:         1.57 cm MR PISA Eff ROA: 11 mm MR PISA Radius:  0.50 cm MV E velocity: 101.00 cm/s MV A velocity: 83.00 cm/s MV E/A ratio:  1.22 Sunit Tolia Electronically signed by Olinda Bertrand Signature Date/Time: 03/11/2024/1:31:54 PM    Final     Cardiac Studies:  ECG: afib rate 99 nonspecific ST changes LVH likely    Telemetry:  NSR this am  Echo: EF 45-50% mild LAE mild MR 09/27/23   Medications:    allopurinol   150 mg Oral Daily   buPROPion   300 mg Oral q morning   furosemide   20 mg  Intravenous Once   gabapentin   200 mg Oral TID   insulin  aspart  0-5 Units Subcutaneous QHS   insulin  aspart  0-9 Units Subcutaneous TID WC   levothyroxine   50 mcg Oral Q0600   metoprolol  succinate  25 mg Oral Daily   mometasone -formoterol   2 puff Inhalation BID   pantoprazole  (PROTONIX ) IV  40 mg Intravenous Q12H   rosuvastatin   40 mg Oral Daily   sodium chloride  flush  3 mL Intravenous Q12H   sodium chloride  flush  3 mL Intravenous Q12H      amiodarone  30 mg/hr (03/12/24 0900)    Assessment/Plan:  63 y.o. with history of CHF, PAF, CAD PVD with right transmet amputation and left fempop fifth toe amputation, left fem pop left CEA HTN,  HLD DM poorly controlled and prior ETOH/cocaine abuse Admitted with GI bleed and dyspnea with chest pain troponin 106 and BNP 666  Troponin elevation:  No acute ECG changes history of small OM dx Rx medically no indication for cath continue lopressor  Not a cath candidate with active GI bleed  CHF:  lasix  with transfusions TTE with slightly lower EF 35-40% BP soft with GI bleed hydralazine  on hold when stable likely start Bidil Anemia Hct 22.8 this am seen by Dr Nickey Barn GI for EGD transfuse again today will write for lasix  20 mg given drop in EF positive I/O and persistent edema  No need to postpone EGD with Hct dropping   PAF:  eliquis  on hold in NSR this am continue beta blocker iv amiodarone  PVD: with significant LA dx post left fem pop right PCI and left CEA   Janelle Mediate 03/12/2024, 11:01 AM

## 2024-03-12 NOTE — Progress Notes (Signed)
 At bedside for PIV insertion x 2 for Amiodarone  and PRBC's. Pt noted to have very poor vasculature. No cephalic noted on RUA, and  Able to cannulate 1 vessel > 1.25 cm in depth with success. Left arm with Amiodarone  infiltration in R A/C. No other suitable veins identified for a second PIV at this time. Recommend PICC/CL for further access needs. RN at bedside and aware.

## 2024-03-12 NOTE — Progress Notes (Signed)
 Mobility Specialist Progress Note;   03/12/24 1137  Mobility  Activity Transferred to/from [facility]  Level of Assistance Minimal assist, patient does 75% or more  Assistive Device None  Distance Ambulated (ft) 3 ft  Activity Response Tolerated well  Mobility Referral Yes  Mobility visit 1 Mobility  Mobility Specialist Start Time (ACUTE ONLY) 1137  Mobility Specialist Stop Time (ACUTE ONLY) 1154  Mobility Specialist Time Calculation (min) (ACUTE ONLY) 17 min   Pt requesting assistance to [facility]. Required MinA to stand from chair and light MinG assist to transfer pt to [facility]. BM successful. VSS throughout. Pt returned back to chair, deferred further ambulation. Pt left with all needs met- call bell in reach.   Janit Meline Mobility Specialist Please contact via SecureChat or [employer] [phone]

## 2024-03-12 NOTE — Progress Notes (Signed)
 Daily Progress Note  DOA: 03/08/2024 Hospital Day: 5   Chief Complaint: FOBT+ anemia  ASSESSMENT    63 y.o. year old female with multiple medical problems not limited to CAD, HFrEF, PAD, CKD stage 5, PAF, COPD, hypertension,  diabetes, GERD.  Patient admitted for 25 after presenting to the ED with chest pain.  She was found to be anemic and FOBT+.  She complained of dysphagia.  See GI consult from 4/26   Symptomatic anemia Melena  Today:  Had a very dark BM this am. Hgb trended down since last unit of blood  ( 8.8 >> 7.4). Got another unit of blood today   AFIB, takes Eliquis  at home ( on hold) Elevated troponin Chest pain Acute on chronic CHF   GERD Dysphagia   PAF Eliquis  is on hold.    Chronic constipation Doesn't routinely take anything at home. Briefly discussed Miralax . Had to manually disimpact herself today.    **ETOH cirrhosis listed in PMH but cannot confirm this. Cirrhosis not reported on CT scan, platelets normal. INR mildly elevated but on Eliquis    See PMH for additional medical problems    Principal Problem:   GI bleed Active Problems:   Insulin  dependent type 2 diabetes mellitus ([name])   Hypothyroidism   S/P peripheral artery angioplasty - TurboHawk atherectomy; R SFA   Paroxysmal atrial fibrillation ([name])   Carotid artery stenosis   Symptomatic anemia   Demand ischemia ([name])   Chronic sinus bradycardia   Peripheral artery disease ([name])   Hyperlipidemia   PAF (paroxysmal atrial fibrillation) ([name])   COPD (chronic obstructive pulmonary disease) ([name])   HFrEF (heart failure with reduced ejection fraction) ([name])   History of CAD (coronary artery disease)   CKD (chronic kidney disease), stage IV ([name])   Continuous dependence on cigarette smoking   GAD (generalized anxiety disorder)   Heme positive stool   Chronic systolic congestive heart failure ([name])   PLAN   --Cardiology has cleared her for EGD. NPO after MN.  The risks and benefits  of EGD with possible biopsies were discussed with the patient who agrees to proceed.  --Continue Pantoprazole  40 mg IV BID --Continue trending H/H --Trial of Miralax  every day and colace at bedtime    Subjective   Feels okay. Had a very dark BM today. Had to manually disimpact herself   Objective   GI Studies:   Reports having had a few polyps removed at time of last colonoscopy about 5 years ago in [facility].  Recent Labs    03/11/24 1856 03/11/24 2342 03/12/24 0454  HGB 8.7* 7.2* 7.4*  HCT 26.8* 22.8* 22.7*   No results for input(s): "FOLATE", "VITAMINB12", "FERRITIN", "TIBC", "IRONPCTSAT" in the last 72 hours. Recent Labs    03/10/24 0910 03/11/24 0518 03/12/24 0454  NA 139 136 133*  K 3.9 3.8 4.8  CL 108 104 105  CO2 23 23 17*  GLUCOSE 89 134* 218*  BUN 59* 45* 47*  CREATININE 1.48* 1.50* 1.76*  CALCIUM  8.5* 8.5* 7.9*   No results for input(s): "PROT", "ALBUMIN ", "AST", "ALT", "ALKPHOS", "BILITOT", "BILIDIR", "IBILI" in the last 72 hours. No results for input(s): "INR" in the last 72 hours. No results for input(s): "AFPTUMOR" in the last 72 hours.   No results for input(s): "ANA" in the last 72 hours.  Imaging:  ECHOCARDIOGRAM COMPLETE    ECHOCARDIOGRAM REPORT       Patient Name:   MARGHERITA HOSP Date of Exam: 03/11/2024  Medical Rec #:  [id number]     Height:       63.0 in Accession #:    [id number]    Weight:       215.6 lb Date of Birth:  1961-05-08    BSA:          1.996 m Patient Age:    62 years      BP:           131/65 mmHg Patient Gender: F             HR:           86 bpm. Exam Location:  Inpatient  Procedure: 2D Echo, Cardiac Doppler, Color Doppler and Intracardiac            Opacification Agent (Both Spectral and Color Flow Doppler were            utilized during procedure).  Indications:    R07.9* Chest pain, unspecified   History:        Patient has prior history of Echocardiogram examinations, most                 recent 09/27/2023.  CHF, CAD, Abnormal ECG, COPD,                 Arrythmias:Atrial Fibrillation, Signs/Symptoms:Dyspnea and                 Shortness of Breath; Risk Factors:Diabetes, Dyslipidemia,                 Current Smoker and Sleep Apnea. ETOH. Substance abuse.   Sonographer:    Raynelle Callow RDCS Referring Phys: [id number] Awilda Bogus WEAVER    Sonographer Comments: Technically difficult study due to poor echo windows, patient is obese and suboptimal parasternal window. Image acquisition challenging due to patient body habitus. IMPRESSIONS   1. Left ventricular ejection fraction, by estimation, is 35 to 40%. The left ventricle has moderately decreased function. The left ventricle demonstrates global hypokinesis. The left ventricular internal cavity size was upper limit of normal. Left  ventricular diastolic parameters are consistent with Grade I diastolic dysfunction (impaired relaxation).  2. Right ventricular systolic function is mildly reduced. The right ventricular size is normal. There is moderately elevated pulmonary artery systolic pressure. The estimated right ventricular systolic pressure is 55.9 mmHg.  3. The mitral valve is degenerative. Moderate mitral valve regurgitation. No evidence of mitral stenosis.  4. The aortic valve was not well visualized. Aortic valve regurgitation is not visualized. No aortic stenosis is present.  5. The inferior vena cava is dilated in size with >50% respiratory variability, suggesting right atrial pressure of 8 mmHg.  Comparison(s): A prior study was performed on 09/27/2023. Reduction in LVEF from 45-50% to 35-40% and RWMAs.  FINDINGS  Left Ventricle: Left ventricular ejection fraction, by estimation, is 35 to 40%. The left ventricle has moderately decreased function. The left ventricle demonstrates global hypokinesis. Definity  contrast agent was given IV to delineate the left  ventricular endocardial borders. The left ventricular internal cavity size was upper limit of  normal. There is no left ventricular hypertrophy. Left ventricular diastolic parameters are consistent with Grade I diastolic dysfunction (impaired relaxation).    LV Wall Scoring: The mid inferoseptal segment, apical septal segment, apical anterior segment, apical inferior segment, and apex are hypokinetic.  Right Ventricle: The right ventricular size is normal. No increase in right ventricular wall thickness. Right ventricular systolic function is mildly reduced. There is moderately elevated pulmonary  artery systolic pressure. The tricuspid regurgitant  velocity is 3.46 m/s, and with an assumed right atrial pressure of 8 mmHg, the estimated right ventricular systolic pressure is 55.9 mmHg.  Left Atrium: Left atrial size was normal in size.  Right Atrium: Right atrial size was normal in size.  Pericardium: There is no evidence of pericardial effusion.  Mitral Valve: The mitral valve is degenerative in appearance. Mild mitral annular calcification. Moderate mitral valve regurgitation, with centrally-directed jet. No evidence of mitral valve stenosis.  Tricuspid Valve: The tricuspid valve is grossly normal. Tricuspid valve regurgitation is mild . No evidence of tricuspid stenosis.  Aortic Valve: The aortic valve was not well visualized. Aortic valve regurgitation is not visualized. No aortic stenosis is present.  Pulmonic Valve: The pulmonic valve was not well visualized. Pulmonic valve regurgitation is not visualized. No evidence of pulmonic stenosis.  Aorta: The aortic root and ascending aorta are structurally normal, with no evidence of dilitation.  Venous: The inferior vena cava is dilated in size with greater than 50% respiratory variability, suggesting right atrial pressure of 8 mmHg.  IAS/Shunts: There is redundancy of the interatrial septum. The interatrial septum was not well visualized.    LEFT VENTRICLE PLAX 2D LVIDd:         5.80 cm      Diastology LVIDs:         5.30 cm       LV e' medial:    7.18 cm/s LV PW:         0.90 cm      LV E/e' medial:  14.1 LV IVS:        1.10 cm      LV e' lateral:   10.60 cm/s LVOT diam:     2.30 cm      LV E/e' lateral: 9.5 LV SV:         64 LV SV Index:   32 LVOT Area:     4.15 cm   LV Volumes (MOD) LV vol d, MOD A2C: 187.0 ml LV vol d, MOD A4C: 155.0 ml LV vol s, MOD A2C: 122.0 ml LV vol s, MOD A4C: 106.0 ml LV SV MOD A2C:     65.0 ml LV SV MOD A4C:     155.0 ml LV SV MOD BP:      52.7 ml  RIGHT VENTRICLE            IVC RV S prime:     9.57 cm/s  IVC diam: 2.20 cm TAPSE (M-mode): 1.3 cm  LEFT ATRIUM             Index        RIGHT ATRIUM           Index LA diam:        3.20 cm 1.60 cm/m   RA Area:     12.30 cm LA Vol (A2C):   53.8 ml 26.95 ml/m  RA Volume:   24.90 ml  12.47 ml/m LA Vol (A4C):   35.8 ml 17.93 ml/m LA Biplane Vol: 45.1 ml 22.59 ml/m  AORTIC VALVE LVOT Vmax:   80.70 cm/s LVOT Vmean:  55.400 cm/s LVOT VTI:    0.153 m   AORTA Ao Root diam: 3.30 cm Ao Asc diam:  3.40 cm  MITRAL VALVE                  TRICUSPID VALVE MV Area (PHT): 4.39 cm       TR Peak  grad:   47.9 mmHg MV Decel Time: 173 msec       TR Vmax:        346.00 cm/s MR Peak grad:    119.7 mmHg MR Mean grad:    77.0 mmHg    SHUNTS MR Vmax:         547.00 cm/s  Systemic VTI:  0.15 m MR Vmean:        413.0 cm/s   Systemic Diam: 2.30 cm MR PISA:         1.57 cm MR PISA Eff ROA: 11 mm MR PISA Radius:  0.50 cm MV E velocity: 101.00 cm/s MV A velocity: 83.00 cm/s MV E/A ratio:  1.22  Sunit Tolia Electronically signed by Olinda Bertrand Signature Date/Time: 03/11/2024/1:31:54 PM      Final       Scheduled inpatient medications:   allopurinol   150 mg Oral Daily   buPROPion   300 mg Oral q morning   gabapentin   200 mg Oral TID   insulin  aspart  0-5 Units Subcutaneous QHS   insulin  aspart  0-9 Units Subcutaneous TID WC   levothyroxine   50 mcg Oral Q0600   metoprolol  succinate  25 mg Oral Daily   mometasone -formoterol   2  puff Inhalation BID   pantoprazole  (PROTONIX ) IV  40 mg Intravenous Q12H   rosuvastatin   40 mg Oral Daily   sodium chloride  flush  3 mL Intravenous Q12H   sodium chloride  flush  3 mL Intravenous Q12H   Continuous inpatient infusions:   amiodarone  30 mg/hr (03/12/24 1528)   PRN inpatient medications: acetaminophen  **OR** acetaminophen , albuterol , dextrose , melatonin, methocarbamol , metoprolol  tartrate, Muscle Rub, ondansetron  **OR** ondansetron  (ZOFRAN ) IV, sodium chloride  flush  Vital signs in last 24 hours: Temp:  [97.7 F (36.5 C)-98.4 F (36.9 C)] 97.9 F (36.6 C) (04/29 1524) Pulse Rate:  [71-111] 71 (04/29 1524) Resp:  [16-18] 16 (04/29 1358) BP: (90-134)/(42-71) 134/68 (04/29 1524) SpO2:  [96 %-100 %] 100 % (04/29 1524) Last BM Date : 03/09/24  Intake/Output Summary (Last 24 hours) at 03/12/2024 1612 Last data filed at 03/12/2024 1524 Gross per 24 hour  Intake 1824.25 ml  Output 200 ml  Net 1624.25 ml    Intake/Output from previous day: 04/28 0701 - 04/29 0700 In: 1335.8 [I.V.:91.2; IV Piggyback:1244.6] Out: 1300 [Urine:1300] Intake/Output this shift: Total I/O In: 488.5 [I.V.:164.5; Blood:324] Out: 200 [Urine:200]   Physical Exam:  General: Alert female in NAD Heart:  Regular rate.  Pulmonary: Normal respiratory effort Abdomen: Soft, nondistended, nontender. Normal bowel sounds. Neurologic: Alert and oriented Psych: Pleasant. Cooperative     LOS: 4 days   Mai Schwalbe ,NP 03/12/2024, 4:12 PM

## 2024-03-12 NOTE — H&P (View-Only) (Signed)
 Daily Progress Note  DOA: 03/08/2024 Hospital Day: 5   Chief Complaint: FOBT+ anemia  ASSESSMENT    63 y.o. year old female with multiple medical problems not limited to CAD, HFrEF, PAD, CKD stage 5, PAF, COPD, hypertension,  diabetes, GERD.  Patient admitted for 25 after presenting to the ED with chest pain.  She was found to be anemic and FOBT+.  She complained of dysphagia.  See GI consult from 4/26   Symptomatic anemia Melena  Today:  Had a very dark BM this am. Hgb trended down since last unit of blood  ( 8.8 >> 7.4). Got another unit of blood today   AFIB, takes Eliquis  at home ( on hold) Elevated troponin Chest pain Acute on chronic CHF   GERD Dysphagia   PAF Eliquis  is on hold.    Chronic constipation Doesn't routinely take anything at home. Briefly discussed Miralax . Had to manually disimpact herself today.    **ETOH cirrhosis listed in PMH but cannot confirm this. Cirrhosis not reported on CT scan, platelets normal. INR mildly elevated but on Eliquis    See PMH for additional medical problems    Principal Problem:   GI bleed Active Problems:   Insulin  dependent type 2 diabetes mellitus ([name])   Hypothyroidism   S/P peripheral artery angioplasty - TurboHawk atherectomy; R SFA   Paroxysmal atrial fibrillation ([name])   Carotid artery stenosis   Symptomatic anemia   Demand ischemia ([name])   Chronic sinus bradycardia   Peripheral artery disease ([name])   Hyperlipidemia   PAF (paroxysmal atrial fibrillation) ([name])   COPD (chronic obstructive pulmonary disease) ([name])   HFrEF (heart failure with reduced ejection fraction) ([name])   History of CAD (coronary artery disease)   CKD (chronic kidney disease), stage IV ([name])   Continuous dependence on cigarette smoking   GAD (generalized anxiety disorder)   Heme positive stool   Chronic systolic congestive heart failure ([name])   PLAN   --Cardiology has cleared her for EGD. NPO after MN.  The risks and benefits  of EGD with possible biopsies were discussed with the patient who agrees to proceed.  --Continue Pantoprazole  40 mg IV BID --Continue trending H/H --Trial of Miralax  every day and colace at bedtime    Subjective   Feels okay. Had a very dark BM today. Had to manually disimpact herself   Objective   GI Studies:   Reports having had a few polyps removed at time of last colonoscopy about 5 years ago in [facility].  Recent Labs    03/11/24 1856 03/11/24 2342 03/12/24 0454  HGB 8.7* 7.2* 7.4*  HCT 26.8* 22.8* 22.7*   No results for input(s): "FOLATE", "VITAMINB12", "FERRITIN", "TIBC", "IRONPCTSAT" in the last 72 hours. Recent Labs    03/10/24 0910 03/11/24 0518 03/12/24 0454  NA 139 136 133*  K 3.9 3.8 4.8  CL 108 104 105  CO2 23 23 17*  GLUCOSE 89 134* 218*  BUN 59* 45* 47*  CREATININE 1.48* 1.50* 1.76*  CALCIUM  8.5* 8.5* 7.9*   No results for input(s): "PROT", "ALBUMIN ", "AST", "ALT", "ALKPHOS", "BILITOT", "BILIDIR", "IBILI" in the last 72 hours. No results for input(s): "INR" in the last 72 hours. No results for input(s): "AFPTUMOR" in the last 72 hours.   No results for input(s): "ANA" in the last 72 hours.  Imaging:  ECHOCARDIOGRAM COMPLETE    ECHOCARDIOGRAM REPORT       Patient Name:   [name] Date of Exam: 03/11/2024  Medical Rec #:  [id number]     Height:       63.0 in Accession #:    [id number]    Weight:       215.6 lb Date of Birth:  1961-05-08    BSA:          1.996 m Patient Age:    62 years      BP:           131/65 mmHg Patient Gender: F             HR:           86 bpm. Exam Location:  Inpatient  Procedure: 2D Echo, Cardiac Doppler, Color Doppler and Intracardiac            Opacification Agent (Both Spectral and Color Flow Doppler were            utilized during procedure).  Indications:    R07.9* Chest pain, unspecified   History:        Patient has prior history of Echocardiogram examinations, most                 recent 09/27/2023.  CHF, CAD, Abnormal ECG, COPD,                 Arrythmias:Atrial Fibrillation, Signs/Symptoms:Dyspnea and                 Shortness of Breath; Risk Factors:Diabetes, Dyslipidemia,                 Current Smoker and Sleep Apnea. ETOH. Substance abuse.   Sonographer:    Raynelle Callow RDCS Referring Phys: [id number] Awilda Bogus WEAVER    Sonographer Comments: Technically difficult study due to poor echo windows, patient is obese and suboptimal parasternal window. Image acquisition challenging due to patient body habitus. IMPRESSIONS   1. Left ventricular ejection fraction, by estimation, is 35 to 40%. The left ventricle has moderately decreased function. The left ventricle demonstrates global hypokinesis. The left ventricular internal cavity size was upper limit of normal. Left  ventricular diastolic parameters are consistent with Grade I diastolic dysfunction (impaired relaxation).  2. Right ventricular systolic function is mildly reduced. The right ventricular size is normal. There is moderately elevated pulmonary artery systolic pressure. The estimated right ventricular systolic pressure is 55.9 mmHg.  3. The mitral valve is degenerative. Moderate mitral valve regurgitation. No evidence of mitral stenosis.  4. The aortic valve was not well visualized. Aortic valve regurgitation is not visualized. No aortic stenosis is present.  5. The inferior vena cava is dilated in size with >50% respiratory variability, suggesting right atrial pressure of 8 mmHg.  Comparison(s): A prior study was performed on 09/27/2023. Reduction in LVEF from 45-50% to 35-40% and RWMAs.  FINDINGS  Left Ventricle: Left ventricular ejection fraction, by estimation, is 35 to 40%. The left ventricle has moderately decreased function. The left ventricle demonstrates global hypokinesis. Definity  contrast agent was given IV to delineate the left  ventricular endocardial borders. The left ventricular internal cavity size was upper limit of  normal. There is no left ventricular hypertrophy. Left ventricular diastolic parameters are consistent with Grade I diastolic dysfunction (impaired relaxation).    LV Wall Scoring: The mid inferoseptal segment, apical septal segment, apical anterior segment, apical inferior segment, and apex are hypokinetic.  Right Ventricle: The right ventricular size is normal. No increase in right ventricular wall thickness. Right ventricular systolic function is mildly reduced. There is moderately elevated pulmonary  artery systolic pressure. The tricuspid regurgitant  velocity is 3.46 m/s, and with an assumed right atrial pressure of 8 mmHg, the estimated right ventricular systolic pressure is 55.9 mmHg.  Left Atrium: Left atrial size was normal in size.  Right Atrium: Right atrial size was normal in size.  Pericardium: There is no evidence of pericardial effusion.  Mitral Valve: The mitral valve is degenerative in appearance. Mild mitral annular calcification. Moderate mitral valve regurgitation, with centrally-directed jet. No evidence of mitral valve stenosis.  Tricuspid Valve: The tricuspid valve is grossly normal. Tricuspid valve regurgitation is mild . No evidence of tricuspid stenosis.  Aortic Valve: The aortic valve was not well visualized. Aortic valve regurgitation is not visualized. No aortic stenosis is present.  Pulmonic Valve: The pulmonic valve was not well visualized. Pulmonic valve regurgitation is not visualized. No evidence of pulmonic stenosis.  Aorta: The aortic root and ascending aorta are structurally normal, with no evidence of dilitation.  Venous: The inferior vena cava is dilated in size with greater than 50% respiratory variability, suggesting right atrial pressure of 8 mmHg.  IAS/Shunts: There is redundancy of the interatrial septum. The interatrial septum was not well visualized.    LEFT VENTRICLE PLAX 2D LVIDd:         5.80 cm      Diastology LVIDs:         5.30 cm       LV e' medial:    7.18 cm/s LV PW:         0.90 cm      LV E/e' medial:  14.1 LV IVS:        1.10 cm      LV e' lateral:   10.60 cm/s LVOT diam:     2.30 cm      LV E/e' lateral: 9.5 LV SV:         64 LV SV Index:   32 LVOT Area:     4.15 cm   LV Volumes (MOD) LV vol d, MOD A2C: 187.0 ml LV vol d, MOD A4C: 155.0 ml LV vol s, MOD A2C: 122.0 ml LV vol s, MOD A4C: 106.0 ml LV SV MOD A2C:     65.0 ml LV SV MOD A4C:     155.0 ml LV SV MOD BP:      52.7 ml  RIGHT VENTRICLE            IVC RV S prime:     9.57 cm/s  IVC diam: 2.20 cm TAPSE (M-mode): 1.3 cm  LEFT ATRIUM             Index        RIGHT ATRIUM           Index LA diam:        3.20 cm 1.60 cm/m   RA Area:     12.30 cm LA Vol (A2C):   53.8 ml 26.95 ml/m  RA Volume:   24.90 ml  12.47 ml/m LA Vol (A4C):   35.8 ml 17.93 ml/m LA Biplane Vol: 45.1 ml 22.59 ml/m  AORTIC VALVE LVOT Vmax:   80.70 cm/s LVOT Vmean:  55.400 cm/s LVOT VTI:    0.153 m   AORTA Ao Root diam: 3.30 cm Ao Asc diam:  3.40 cm  MITRAL VALVE                  TRICUSPID VALVE MV Area (PHT): 4.39 cm       TR Peak  grad:   47.9 mmHg MV Decel Time: 173 msec       TR Vmax:        346.00 cm/s MR Peak grad:    119.7 mmHg MR Mean grad:    77.0 mmHg    SHUNTS MR Vmax:         547.00 cm/s  Systemic VTI:  0.15 m MR Vmean:        413.0 cm/s   Systemic Diam: 2.30 cm MR PISA:         1.57 cm MR PISA Eff ROA: 11 mm MR PISA Radius:  0.50 cm MV E velocity: 101.00 cm/s MV A velocity: 83.00 cm/s MV E/A ratio:  1.22  Sunit Tolia Electronically signed by Olinda Bertrand Signature Date/Time: 03/11/2024/1:31:54 PM      Final       Scheduled inpatient medications:   allopurinol   150 mg Oral Daily   buPROPion   300 mg Oral q morning   gabapentin   200 mg Oral TID   insulin  aspart  0-5 Units Subcutaneous QHS   insulin  aspart  0-9 Units Subcutaneous TID WC   levothyroxine   50 mcg Oral Q0600   metoprolol  succinate  25 mg Oral Daily   mometasone -formoterol   2  puff Inhalation BID   pantoprazole  (PROTONIX ) IV  40 mg Intravenous Q12H   rosuvastatin   40 mg Oral Daily   sodium chloride  flush  3 mL Intravenous Q12H   sodium chloride  flush  3 mL Intravenous Q12H   Continuous inpatient infusions:   amiodarone  30 mg/hr (03/12/24 1528)   PRN inpatient medications: acetaminophen  **OR** acetaminophen , albuterol , dextrose , melatonin, methocarbamol , metoprolol  tartrate, Muscle Rub, ondansetron  **OR** ondansetron  (ZOFRAN ) IV, sodium chloride  flush  Vital signs in last 24 hours: Temp:  [97.7 F (36.5 C)-98.4 F (36.9 C)] 97.9 F (36.6 C) (04/29 1524) Pulse Rate:  [71-111] 71 (04/29 1524) Resp:  [16-18] 16 (04/29 1358) BP: (90-134)/(42-71) 134/68 (04/29 1524) SpO2:  [96 %-100 %] 100 % (04/29 1524) Last BM Date : 03/09/24  Intake/Output Summary (Last 24 hours) at 03/12/2024 1612 Last data filed at 03/12/2024 1524 Gross per 24 hour  Intake 1824.25 ml  Output 200 ml  Net 1624.25 ml    Intake/Output from previous day: 04/28 0701 - 04/29 0700 In: 1335.8 [I.V.:91.2; IV Piggyback:1244.6] Out: 1300 [Urine:1300] Intake/Output this shift: Total I/O In: 488.5 [I.V.:164.5; Blood:324] Out: 200 [Urine:200]   Physical Exam:  General: Alert female in NAD Heart:  Regular rate.  Pulmonary: Normal respiratory effort Abdomen: Soft, nondistended, nontender. Normal bowel sounds. Neurologic: Alert and oriented Psych: Pleasant. Cooperative     LOS: 4 days   Mai Schwalbe ,NP 03/12/2024, 4:12 PM

## 2024-03-12 NOTE — Progress Notes (Signed)
 Informed by CCMD patient had a 3.2 second pause at 0449. Patient then converted back to NSR at 0451. Had EKG done to confirm. Notified Dr. Achilles Holes and Dr. Donna Fus to let them know and ask if meds need to be adjusted or discontinued. Will continue to monitor patient.

## 2024-03-12 NOTE — Progress Notes (Signed)
 Patient loss IV access. This RN attempted x2 and IV team was only able to get one PIV. Cardiology paged and made aware amio gtt was stopped. Cardiology said it is okay to pause amio gtt until blood is finished.

## 2024-03-12 NOTE — Progress Notes (Signed)
 Progress Note    [name]   ZOX:[id number]  DOB: 12/26/1960  DOA: 03/08/2024     4 PCP: Karla Snare, NP  Initial CC: dark stool, chest pain  Hospital Course: [name] is a 63 yo female with PMH CAD, HFrEF, dCHF, CAD, CKDIV, carotid stenosis s/p endarterectomy, PAF, PAD, COPD, ongoing tobacco use, chronic bradycardia, gout, HTN, DM II, HLD, vitamin D  deficiency who presented with chest pain complaints.  She was given nitroglycerin  by EMS.  She also was complaining of dark stools.  She states she does take BC powder approximately 1-2 times per month but denied any more frequent.  ED Course:  At presentation to ED patient is borderline hypotensive blood pressure 110/41, tachycardic 99-108.  O2 sat 100% room air. Troponin 94>>106 CBC showing low hemoglobin 5.3 (baseline hemoglobin around 10-11.  Elevated WBC count 12.3 for last 2 months.  Normal platelet count. CMP showing creatinine 2.32 and GFR 23.  Renal function at baseline.  Otherwise unremarkable CMP. Elevated BNP 666. EKG showing atrial fibrillation heart rate 99, left ventricular hypertrophy pattern. Chest ray showing no acute disease process.  Aortic atherosclerosis.   Dr.Yao from bedside rectal exam which showed patient has melena colicky stool in the rectal vault.   In the ED 2 unit blood transfusion has been ordered.  And patient received IV Protonix  80 mg.  She was admitted for further GI bleed workup.  Interval History:  No bleeding or further bowel movements.  Denies chest pain or shortness of breath this morning. Hemoglobin lower this am and getting more blood today.  Hopefully can get EGD soon.  Cardiology and GI following. Potentially will get EGD tomorrow.  Afib RVR overnight, placed on amio, but back in NSR this morning.   Assessment and Plan: * GI bleed - Potential for upper GIB given eliquis  use and intermittent use of BC powder -Initial hemoglobin 5.3 g/dL with baseline around 11 g/dL - She received  2 units PRBC on admission and repeat hemoglobin 4/26, 7.6 g/dL - GI consulted on admission for potential EGD, follow up evaluation - Anesthesia requesting cardiology evaluation.  EGD postponed until then - continue trending H/H and will transfuse further if necessary - continue protonix   - Hgb target > 8 g/dL (see demand ischemia below) - Hgb again downtrended today; 7.4 g/dL, and developed RVR overnight; will transfuse 1 more unit PRBC (cardiology giving lasix  with blood given mrEF, but BP also borderline so very fine balance)  Paroxysmal atrial fibrillation ([name]) - Eliquis  on hold for now -Continue Toprol  - Started on amiodarone  drip overnight due to reverting to A-fib with RVR; spontaneously converted afterwards also and back in normal sinus rhythm this morning - Cardiology managing amiodarone   Demand ischemia ([name]) - Presented with chest pain complaints and some shortness of breath - Troponins elevated some on admission 94>>106 - EKG noted with afib but no alarming signs of ischemia - case initially discussed case with on-call cardiology. After anesthesia evaluation, request for formal cardiology consult for clearance prior to EGD - appreciate cardiology assistance; agrees to be acceptable risk for EGD; if continued CP/angina then may need more ischemic workup - will plan to target Hgb > 8 g/dL in setting of underlying heart disease and risk factors - Echo performed: EF 35 to 40%, global hypokinesis, grade 1 DD.  Mild decrease in EF compared to 09/27/2023 echo; ischemic eval deferred to cardiology  CKD (chronic kidney disease), stage IV ([name]) - patient has history of CKD4. Baseline creat ~  2.2, eGFR~ 25   Chest pain-resolved as of 03/09/2024 - see demand ischemia  HFrEF (heart failure with reduced ejection fraction) ([name]) - no s/s exacerbation  - Last echo reviewed from 09/27/2023: EF 45 to 50%, RWMA, mild LVH, grade 2 DD - New echo this admission now showing further decreased EF,  35 to 40%, grade 1 DD, global hypokinesis - cardiology following  Symptomatic anemia - see GIB  Insulin  dependent type 2 diabetes mellitus ([name]) - Last A1c 12.6% on 01/23/2024 - Continue on SSI and CBG monitoring for now  Peripheral artery disease ([name]) - Eliquis  on hold - Continue Crestor   COPD (chronic obstructive pulmonary disease) ([name]) - No signs/symptoms of exacerbation  Hyperlipidemia - Continue Crestor   Hypothyroidism - Continue Synthroid   GAD (generalized anxiety disorder) - Continue Wellbutrin   History of CAD (coronary artery disease) - Eliquis  on hold - Continue statin, Toprol    Old records reviewed in assessment of this patient  Antimicrobials:   DVT prophylaxis:  SCDs Start: 03/08/24 2023 Place and maintain sequential compression device Start: 03/08/24 2023 Place TED hose Start: 03/08/24 2023   Code Status:   Code Status: Full Code  Mobility Assessment (Last 72 Hours)     Mobility Assessment     Row Name 03/12/24 0800 03/11/24 2030 03/11/24 0800 03/10/24 2200 03/10/24 0800   Does patient have an order for bedrest or is patient medically unstable Yes- Bedfast (Level 1) - Complete Yes- Bedfast (Level 1) - Complete No - Continue assessment Yes- Bedfast (Level 1) - Complete Yes- Bedfast (Level 1) - Complete   What is the highest level of mobility based on the progressive mobility assessment? Level 3 (Stands with assist) - Balance while standing  and cannot march in place Level 3 (Stands with assist) - Balance while standing  and cannot march in place Level 3 (Stands with assist) - Balance while standing  and cannot march in place Level 3 (Stands with assist) - Balance while standing  and cannot march in place Level 3 (Stands with assist) - Balance while standing  and cannot march in place   Is the above level different from baseline mobility prior to current illness? Yes - Recommend PT order Yes - Recommend PT order Yes - Recommend PT order Yes - Recommend  PT order Yes - Recommend PT order    Row Name 03/09/24 2000 03/09/24 1400         Does patient have an order for bedrest or is patient medically unstable No - Continue assessment No - Continue assessment      What is the highest level of mobility based on the progressive mobility assessment? Level 3 (Stands with assist) - Balance while standing  and cannot march in place Level 3 (Stands with assist) - Balance while standing  and cannot march in place      Is the above level different from baseline mobility prior to current illness? Yes - Recommend PT order Yes - Recommend PT order               Barriers to discharge: none Disposition Plan:  Home HH orders placed: n/a Status is: Inpt  Objective: Blood pressure (!) 131/59, pulse 72, temperature 98.4 F (36.9 C), temperature source Oral, resp. rate 18, height 5\' 3"  (1.6 m), weight 97.8 kg, SpO2 100%.  Examination:  Physical Exam Constitutional:      Appearance: Normal appearance.  HENT:     Head: Normocephalic and atraumatic.     Mouth/Throat:  Mouth: Mucous membranes are moist.  Eyes:     Extraocular Movements: Extraocular movements intact.  Cardiovascular:     Rate and Rhythm: Normal rate and regular rhythm.  Pulmonary:     Effort: Pulmonary effort is normal. No respiratory distress.     Breath sounds: Normal breath sounds. No wheezing.  Abdominal:     General: Bowel sounds are normal. There is no distension.     Palpations: Abdomen is soft.     Tenderness: There is no abdominal tenderness.  Musculoskeletal:        General: Normal range of motion.     Cervical back: Normal range of motion and neck supple.  Skin:    General: Skin is warm and dry.  Neurological:     General: No focal deficit present.     Mental Status: She is alert.  Psychiatric:        Mood and Affect: Mood normal.      Consultants:  GI Cardiology  Procedures:    Data Reviewed: Results for orders placed or performed during the hospital  encounter of 03/08/24 (from the past 24 hours)  Glucose, capillary     Status: Abnormal   Collection Time: 03/11/24  4:03 PM  Result Value Ref Range   Glucose-Capillary 101 (H) 70 - 99 mg/dL  Hemoglobin and hematocrit, blood     Status: Abnormal   Collection Time: 03/11/24  6:56 PM  Result Value Ref Range   Hemoglobin 8.7 (L) 12.0 - 15.0 g/dL   HCT 69.6 (L) 29.5 - 28.4 %  Glucose, capillary     Status: Abnormal   Collection Time: 03/11/24  9:04 PM  Result Value Ref Range   Glucose-Capillary 208 (H) 70 - 99 mg/dL  Hemoglobin and hematocrit, blood     Status: Abnormal   Collection Time: 03/11/24 11:42 PM  Result Value Ref Range   Hemoglobin 7.2 (L) 12.0 - 15.0 g/dL   HCT 13.2 (L) 44.0 - 10.2 %  Hemoglobin and hematocrit, blood     Status: Abnormal   Collection Time: 03/12/24  4:54 AM  Result Value Ref Range   Hemoglobin 7.4 (L) 12.0 - 15.0 g/dL   HCT 72.5 (L) 36.6 - 44.0 %  Basic metabolic panel with GFR     Status: Abnormal   Collection Time: 03/12/24  4:54 AM  Result Value Ref Range   Sodium 133 (L) 135 - 145 mmol/L   Potassium 4.8 3.5 - 5.1 mmol/L   Chloride 105 98 - 111 mmol/L   CO2 17 (L) 22 - 32 mmol/L   Glucose, Bld 218 (H) 70 - 99 mg/dL   BUN 47 (H) 8 - 23 mg/dL   Creatinine, Ser 3.47 (H) 0.44 - 1.00 mg/dL   Calcium  7.9 (L) 8.9 - 10.3 mg/dL   GFR, Estimated 33 (L) >60 mL/min   Anion gap 11 5 - 15  Magnesium      Status: None   Collection Time: 03/12/24  4:54 AM  Result Value Ref Range   Magnesium  2.0 1.7 - 2.4 mg/dL  Glucose, capillary     Status: Abnormal   Collection Time: 03/12/24  7:22 AM  Result Value Ref Range   Glucose-Capillary 201 (H) 70 - 99 mg/dL  Type and screen [facility]     Status: None (Preliminary result)   Collection Time: 03/12/24  9:27 AM  Result Value Ref Range   ABO/RH(D) A POS    Antibody Screen NEG    Sample Expiration 03/15/2024,2359  Unit Number [id number]    Blood Component Type RBC LR PHER2    Unit division  00    Status of Unit ISSUED    Transfusion Status OK TO TRANSFUSE    Crossmatch Result      Compatible Performed at [facility], [address], [place], [place] [address]   Prepare RBC (crossmatch)     Status: None   Collection Time: 03/12/24  9:28 AM  Result Value Ref Range   Order Confirmation      ORDER PROCESSED BY BLOOD BANK Performed at [facility], [name], [place], [place] [address]   Glucose, capillary     Status: Abnormal   Collection Time: 03/12/24 11:26 AM  Result Value Ref Range   Glucose-Capillary 180 (H) 70 - 99 mg/dL   *Note: Due to a large number of results and/or encounters for the requested time period, some results have not been displayed. A complete set of results can be found in Results Review.    I have reviewed pertinent nursing notes, vitals, labs, and images as necessary. I have ordered labwork to follow up on as indicated.  I have reviewed the last notes from staff over past 24 hours. I have discussed patient's care plan and test results with nursing staff, CM/SW, and other staff as appropriate.  Time spent: Greater than 50% of the 55 minute visit was spent in counseling/coordination of care for the patient as laid out in the A&P.   LOS: 4 days   Faith Homes, MD [facility] 03/12/2024, 1:39 PM

## 2024-03-12 NOTE — Inpatient Diabetes Management (Signed)
 Inpatient Diabetes Program Recommendations  AACE/ADA: New Consensus Statement on Inpatient Glycemic Control (2015)  Target Ranges:  Prepandial:   less than 140 mg/dL      Peak postprandial:   less than 180 mg/dL (1-2 hours)      Critically ill patients:  140 - 180 mg/dL   Lab Results  Component Value Date   GLUCAP 180 (H) 03/12/2024   HGBA1C 12.6 (H) 01/23/2024    Review of Glycemic Control  Latest Reference Range & Units 03/11/24 07:48 03/11/24 11:34 03/11/24 16:03 03/11/24 21:04 03/12/24 07:22 03/12/24 11:26  Glucose-Capillary 70 - 99 mg/dL 191 (H) 478 (H) 295 (H) 208 (H) 201 (H) 180 (H)   Diabetes history: DM 2 Outpatient Diabetes medications: Lantus  56 units, Humalog  20 units tid before meals, Mounjaro 7.5 mg weekly (has one dose) Current orders for Inpatient glycemic control:  Novolog  0-9 units tid + hs A1c 12.6% on 3/11 at Endocrinology appointment has had several adjustments since that time. Pt in close contact with Dr. Vertell Gory with [facility] End[facility].   Communication in chart from Endocrinology regarding medication adjustments noted on the following dates: 3/13 3/24 4/9 4/22  Spoke with pt at bedside regarding A1c and glucose trends at home. Pt reports her glucose trends have improved since adjustments have been made to her medication. Encouraged follow up with Endocrinologist. Follow up appointment already scheduled for pt. Will follow glucose trends wile here.  Thanks,  Karla Hake RN, MSN, BC-ADM Inpatient Diabetes Coordinator Team Pager [phone] (8a-5p)

## 2024-03-12 NOTE — Progress Notes (Signed)
 Rounding Note    Patient Name: [name] Date of Encounter: 03/12/2024  [facility] Cardiologist: Lauro Portal, MD   Subjective   Examined sitting up in chair, amiodarone  gtt turned off. She feels mildly improved back in SR. She reports continued hypervolemia.  Inpatient Medications    Scheduled Meds:  sodium chloride    Intravenous Once   allopurinol   150 mg Oral Daily   buPROPion   300 mg Oral q morning   gabapentin   200 mg Oral TID   insulin  aspart  0-5 Units Subcutaneous QHS   insulin  aspart  0-9 Units Subcutaneous TID WC   levothyroxine   50 mcg Oral Q0600   metoprolol  succinate  25 mg Oral Daily   mometasone -formoterol   2 puff Inhalation BID   pantoprazole  (PROTONIX ) IV  40 mg Intravenous Q12H   rosuvastatin   40 mg Oral Daily   sodium chloride  flush  3 mL Intravenous Q12H   sodium chloride  flush  3 mL Intravenous Q12H   Continuous Infusions:  amiodarone  30 mg/hr (03/12/24 0900)   PRN Meds: acetaminophen  **OR** acetaminophen , albuterol , dextrose , melatonin, methocarbamol , metoprolol  tartrate, Muscle Rub, ondansetron  **OR** ondansetron  (ZOFRAN ) IV, sodium chloride  flush   Vital Signs    Vitals:   03/11/24 2048 03/11/24 2102 03/11/24 2222 03/12/24 0532  BP: 111/71 126/70 (!) 90/42 (!) 95/58  Pulse:   (!) 111 76  Resp:   16 18  Temp:    98.4 F (36.9 C)  TempSrc:    Oral  SpO2:    96%  Weight:      Height:        Intake/Output Summary (Last 24 hours) at 03/12/2024 0934 Last data filed at 03/12/2024 0900 Gross per 24 hour  Intake 1469.38 ml  Output 600 ml  Net 869.38 ml      03/09/2024   11:01 AM 03/05/2024    1:09 PM 02/29/2024   11:16 AM  Last 3 Weights  Weight (lbs) 215 lb 9.6 oz 216 lb 216 lb 9.6 oz  Weight (kg) 97.796 kg 97.977 kg 98.249 kg      Telemetry    Afib with conversion to SR overnight with post conversion pause of 3.22 sec with brief junctional rhythm - Personally Reviewed  ECG    No new tracings - Personally  Reviewed  Physical Exam   GEN: No acute distress.   Neck: No JVD Cardiac: RRR, no murmurs, rubs, or gallops.  Respiratory: diminished in bases GI: Soft, nontender, non-distended  MS: B LE edema with prior right toe amupation Neuro:  Nonfocal  Psych: Normal affect   Labs    High Sensitivity Troponin:   Recent Labs  Lab 03/08/24 1828 03/08/24 1936  TROPONINIHS 94* 106*     Chemistry Recent Labs  Lab 03/08/24 1828 03/09/24 0514 03/10/24 0910 03/11/24 0518 03/12/24 0454  NA 138 141 139 136 133*  K 4.2 4.2 3.9 3.8 4.8  CL 99 103 108 104 105  CO2 25 29 23 23  17*  GLUCOSE 220* 177* 89 134* 218*  BUN 93* 93* 59* 45* 47*  CREATININE 2.32* 2.31* 1.48* 1.50* 1.76*  CALCIUM  9.2 8.9 8.5* 8.5* 7.9*  MG  --   --  2.3 2.1 2.0  PROT 6.3* 5.3*  --   --   --   ALBUMIN  3.0* 2.6*  --   --   --   AST 19 20  --   --   --   ALT 13 11  --   --   --  ALKPHOS 66 54  --   --   --   BILITOT 0.5 0.7  --   --   --   GFRNONAA 23* 23* 40* 39* 33*  ANIONGAP 14 9 8 9 11     Lipids No results for input(s): "CHOL", "TRIG", "HDL", "LABVLDL", "LDLCALC", "CHOLHDL" in the last 168 hours.  Hematology Recent Labs  Lab 03/08/24 1828 03/09/24 0209 03/09/24 0514 03/09/24 1238 03/11/24 1856 03/11/24 2342 03/12/24 0454  WBC 12.3*  --  11.1*  --   --   --   --   RBC 1.69*  --  2.37*  --   --   --   --   HGB 5.3*   < > 7.4*   < > 8.7* 7.2* 7.4*  HCT 17.9*   < > 23.1*   < > 26.8* 22.8* 22.7*  MCV 105.9*  --  97.5  --   --   --   --   MCH 31.4  --  31.2  --   --   --   --   MCHC 29.6*  --  32.0  --   --   --   --   RDW 17.9*  --  18.1*  --   --   --   --   PLT 218  --  222  --   --   --   --    < > = values in this interval not displayed.   Thyroid  No results for input(s): "TSH", "FREET4" in the last 168 hours.  BNP Recent Labs  Lab 03/08/24 1828  BNP 666.1*    DDimer No results for input(s): "DDIMER" in the last 168 hours.   Radiology    ECHOCARDIOGRAM COMPLETE Result Date: 03/11/2024     ECHOCARDIOGRAM REPORT   Patient Name:   [name] Date of Exam: 03/11/2024 Medical Rec #:  [id number]     Height:       63.0 in Accession #:    [id number]    Weight:       215.6 lb Date of Birth:  1961-06-30    BSA:          1.996 m Patient Age:    62 years      BP:           131/65 mmHg Patient Gender: F             HR:           86 bpm. Exam Location:  Inpatient Procedure: 2D Echo, Cardiac Doppler, Color Doppler and Intracardiac            Opacification Agent (Both Spectral and Color Flow Doppler were            utilized during procedure). Indications:    R07.9* Chest pain, unspecified  History:        Patient has prior history of Echocardiogram examinations, most                 recent 09/27/2023. CHF, CAD, Abnormal ECG, COPD,                 Arrythmias:Atrial Fibrillation, Signs/Symptoms:Dyspnea and                 Shortness of Breath; Risk Factors:Diabetes, Dyslipidemia,                 Current Smoker and Sleep Apnea. ETOH. Substance abuse.  Sonographer:    Raynelle Callow RDCS Referring  Phys: [id number] Awilda Bogus WEAVER  Sonographer Comments: Technically difficult study due to poor echo windows, patient is obese and suboptimal parasternal window. Image acquisition challenging due to patient body habitus. IMPRESSIONS  1. Left ventricular ejection fraction, by estimation, is 35 to 40%. The left ventricle has moderately decreased function. The left ventricle demonstrates global hypokinesis. The left ventricular internal cavity size was upper limit of normal. Left ventricular diastolic parameters are consistent with Grade I diastolic dysfunction (impaired relaxation).  2. Right ventricular systolic function is mildly reduced. The right ventricular size is normal. There is moderately elevated pulmonary artery systolic pressure. The estimated right ventricular systolic pressure is 55.9 mmHg.  3. The mitral valve is degenerative. Moderate mitral valve regurgitation. No evidence of mitral stenosis.  4. The aortic valve was not  well visualized. Aortic valve regurgitation is not visualized. No aortic stenosis is present.  5. The inferior vena cava is dilated in size with >50% respiratory variability, suggesting right atrial pressure of 8 mmHg. Comparison(s): A prior study was performed on 09/27/2023. Reduction in LVEF from 45-50% to 35-40% and RWMAs. FINDINGS  Left Ventricle: Left ventricular ejection fraction, by estimation, is 35 to 40%. The left ventricle has moderately decreased function. The left ventricle demonstrates global hypokinesis. Definity  contrast agent was given IV to delineate the left ventricular endocardial borders. The left ventricular internal cavity size was upper limit of normal. There is no left ventricular hypertrophy. Left ventricular diastolic parameters are consistent with Grade I diastolic dysfunction (impaired relaxation).  LV Wall Scoring: The mid inferoseptal segment, apical septal segment, apical anterior segment, apical inferior segment, and apex are hypokinetic. Right Ventricle: The right ventricular size is normal. No increase in right ventricular wall thickness. Right ventricular systolic function is mildly reduced. There is moderately elevated pulmonary artery systolic pressure. The tricuspid regurgitant velocity is 3.46 m/s, and with an assumed right atrial pressure of 8 mmHg, the estimated right ventricular systolic pressure is 55.9 mmHg. Left Atrium: Left atrial size was normal in size. Right Atrium: Right atrial size was normal in size. Pericardium: There is no evidence of pericardial effusion. Mitral Valve: The mitral valve is degenerative in appearance. Mild mitral annular calcification. Moderate mitral valve regurgitation, with centrally-directed jet. No evidence of mitral valve stenosis. Tricuspid Valve: The tricuspid valve is grossly normal. Tricuspid valve regurgitation is mild . No evidence of tricuspid stenosis. Aortic Valve: The aortic valve was not well visualized. Aortic valve  regurgitation is not visualized. No aortic stenosis is present. Pulmonic Valve: The pulmonic valve was not well visualized. Pulmonic valve regurgitation is not visualized. No evidence of pulmonic stenosis. Aorta: The aortic root and ascending aorta are structurally normal, with no evidence of dilitation. Venous: The inferior vena cava is dilated in size with greater than 50% respiratory variability, suggesting right atrial pressure of 8 mmHg. IAS/Shunts: There is redundancy of the interatrial septum. The interatrial septum was not well visualized.  LEFT VENTRICLE PLAX 2D LVIDd:         5.80 cm      Diastology LVIDs:         5.30 cm      LV e' medial:    7.18 cm/s LV PW:         0.90 cm      LV E/e' medial:  14.1 LV IVS:        1.10 cm      LV e' lateral:   10.60 cm/s LVOT diam:     2.30 cm  LV E/e' lateral: 9.5 LV SV:         64 LV SV Index:   32 LVOT Area:     4.15 cm  LV Volumes (MOD) LV vol d, MOD A2C: 187.0 ml LV vol d, MOD A4C: 155.0 ml LV vol s, MOD A2C: 122.0 ml LV vol s, MOD A4C: 106.0 ml LV SV MOD A2C:     65.0 ml LV SV MOD A4C:     155.0 ml LV SV MOD BP:      52.7 ml RIGHT VENTRICLE            IVC RV S prime:     9.57 cm/s  IVC diam: 2.20 cm TAPSE (M-mode): 1.3 cm LEFT ATRIUM             Index        RIGHT ATRIUM           Index LA diam:        3.20 cm 1.60 cm/m   RA Area:     12.30 cm LA Vol (A2C):   53.8 ml 26.95 ml/m  RA Volume:   24.90 ml  12.47 ml/m LA Vol (A4C):   35.8 ml 17.93 ml/m LA Biplane Vol: 45.1 ml 22.59 ml/m  AORTIC VALVE LVOT Vmax:   80.70 cm/s LVOT Vmean:  55.400 cm/s LVOT VTI:    0.153 m  AORTA Ao Root diam: 3.30 cm Ao Asc diam:  3.40 cm MITRAL VALVE                  TRICUSPID VALVE MV Area (PHT): 4.39 cm       TR Peak grad:   47.9 mmHg MV Decel Time: 173 msec       TR Vmax:        346.00 cm/s MR Peak grad:    119.7 mmHg MR Mean grad:    77.0 mmHg    SHUNTS MR Vmax:         547.00 cm/s  Systemic VTI:  0.15 m MR Vmean:        413.0 cm/s   Systemic Diam: 2.30 cm MR PISA:          1.57 cm MR PISA Eff ROA: 11 mm MR PISA Radius:  0.50 cm MV E velocity: 101.00 cm/s MV A velocity: 83.00 cm/s MV E/A ratio:  1.22 Sunit Tolia Electronically signed by Olinda Bertrand Signature Date/Time: 03/11/2024/1:31:54 PM    Final     Cardiac Studies   Echo 03/11/24:  1. Left ventricular ejection fraction, by estimation, is 35 to 40%. The  left ventricle has moderately decreased function. The left ventricle  demonstrates global hypokinesis. The left ventricular internal cavity size  was upper limit of normal. Left  ventricular diastolic parameters are consistent with Grade I diastolic  dysfunction (impaired relaxation).   2. Right ventricular systolic function is mildly reduced. The right  ventricular size is normal. There is moderately elevated pulmonary artery  systolic pressure. The estimated right ventricular systolic pressure is  55.9 mmHg.   3. The mitral valve is degenerative. Moderate mitral valve regurgitation.  No evidence of mitral stenosis.   4. The aortic valve was not well visualized. Aortic valve regurgitation  is not visualized. No aortic stenosis is present.   5. The inferior vena cava is dilated in size with >50% respiratory  variability, suggesting right atrial pressure of 8 mmHg.   Patient Profile     63 y.o. female with history of CHF,  PAF, CAD PVD with right transmet amputation and left fempop fifth toe amputation, left fem pop left CEA HTN, HLD DM poorly controlled and prior ETOH/cocaine abuse Admitted with GI bleed and dyspnea with chest pain troponin 106 and BNP 666   Assessment & Plan    Acute on chronic systolic heart failure - echo yesterday with LVEF 35-40% with grade 1 DD, and global hypokinesis - EF appears worse compared to echo 09/2023 - RV mildly reduced, moderate MR - not currently diuresing - with cardiomems in place - GDMT historically limited by AKI, intolerant to SGLT2i given UTIs - conitues to have LE edema, will revisit diuresis once GI  studies are completed today   Atrial fibrillation with  PAF with prior DCCV - on IV amiodarone  - now turned off - received IV digoxin 0.25 mg yesterday x 1 dose - telemetry appears sinus rhythm, had a 3.22 sec post conversion pause overnight with subsequent junctional rhythm, now in sinus rhythm - eliquis  on hold for GI bleed/anemia - restart per GI - consider transitioning to PO amiodarone  load after GI procedures   CAD, PAD Elevated troponin - heart cath 2015 with small OM1 stenosis - poor cath candidate given CKD - will continue to treat medically - no chest pain reported today     For questions or updates, please contact [facility] Please consult [email or web address] for contact info under        Signed, Lamond Pilot, PA  03/12/2024, 9:34 AM

## 2024-03-12 NOTE — TOC Initial Note (Signed)
 Transition of Care [name]) - Initial/Assessment Note    Patient Details  Name: [name] MRN: [id number] Date of Birth: Apr 19, 1961  [facility]) CM/SW Contact:    Cosimo Diones, RN Phone Number: 03/12/2024, 4:27 PM  Clinical Narrative: Patient presented for GI Bleed. PTA patient was from home with support of her sister. Patient has PCP and gets to appointments without any issues. Patient is asking for a rollator. Case Manager has asked for PT/OT consult. Awaiting recommendations for DME. Case Manager will continue to follow for additional transition of care needs.             Expected Discharge Plan: Home/Self Care Barriers to Discharge: Continued Medical Work up   Patient Goals and CMS Choice Patient states their goals for this hospitalization and ongoing recovery are:: patient wants to return home and wants a new rollator.   Expected Discharge Plan and Services In-house Referral: Clinical Social Work Discharge Planning Services: CM Consult   Living arrangements for the past 2 months: Apartment  Prior Living Arrangements/Services Living arrangements for the past 2 months: Apartment Lives with:: Siblings (home with sister.) Patient language and need for interpreter reviewed:: Yes Do you feel safe going back to the place where you live?: Yes      Need for Family Participation in Patient Care: No (Comment) Care giver support system in place?: No (comment)   Criminal Activity/Legal Involvement Pertinent to Current Situation/Hospitalization: No - Comment as needed  Activities of Daily Living   ADL Screening (condition at time of admission) Independently performs ADLs?: Yes (appropriate for developmental age) Is the patient deaf or have difficulty hearing?: No Does the patient have difficulty seeing, even when wearing glasses/contacts?: Yes Does the patient have difficulty concentrating, remembering, or making decisions?: No  Permission  Sought/Granted Permission sought to share information with : Family Supports, Case Manager Permission granted to share information with : Yes, Release of Information Signed   Emotional Assessment Appearance:: Appears stated age Attitude/Demeanor/Rapport: Engaged Affect (typically observed): Appropriate Orientation: : Oriented to Self, Oriented to Place, Oriented to  Time, Oriented to Situation Alcohol / Substance Use: Not Applicable Psych Involvement: No (comment)  Admission diagnosis:  GI bleed [K92.2] Anemia, unspecified type [D64.9] Chest pain, unspecified type [R07.9] Patient Active Problem List   Diagnosis Date Noted   Chronic systolic congestive heart failure ([name]) 03/12/2024   Heme positive stool 03/11/2024   GI bleed 03/08/2024   History of CAD (coronary artery disease) 03/08/2024   CKD (chronic kidney disease), stage IV ([name]) 03/08/2024   Continuous dependence on cigarette smoking 03/08/2024   GAD (generalized anxiety disorder) 03/08/2024   HFrEF (heart failure with reduced ejection fraction) ([name]) 10/11/2023   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) and diastolic (congestive) heart failure ([name]) 10/10/2023   Pre-ulcerative calluses 04/25/2022   PAF (paroxysmal atrial fibrillation) ([name]) 05/05/2021   COPD (chronic obstructive pulmonary disease) ([name]) 05/05/2021   OSA (obstructive sleep apnea) 05/05/2021   Stage 3b chronic kidney disease ([name]) 05/05/2021   Pressure injury of skin 05/04/2021   Diabetic nephropathy ([name]) 01/02/2021   Low back pain 06/01/2020   Hyperlipidemia 04/03/2020   Peripheral artery disease ([name]) 12/26/2019   NICM (nonischemic cardiomyopathy) ([name]) 06/20/2019   Non-healing ulcer ([name]) 06/20/2019   Coagulation disorder ([name]) 08/09/2017   Depression 07/21/2017   At risk for adverse drug reaction 06/20/2017   Peripheral neuropathy 06/20/2017   S/P transmetatarsal amputation of foot, right ([name]) 06/05/2017   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 ([name]) 05/19/2017   Obesity, class 2 02/24/2016   Encounter for therapeutic drug monitoring 02/10/2016   Chronic sinus bradycardia 01/12/2016   Essential hypertension 12/22/2015   Demand ischemia ([name])    Acute on chronic diastolic CHF (congestive heart failure) ([name])    Symptomatic anemia 11/08/2015   Hypokalemia 11/08/2015   Tobacco abuse 10/23/2015   Coronary artery disease    DOE (dyspnea on exertion) 04/29/2015   Paroxysmal atrial fibrillation ([name]) 01/16/2015   Carotid artery stenosis 01/16/2015   Insomnia 02/03/2014   S/P peripheral artery angioplasty - TurboHawk atherectomy; R SFA 09/11/2013    Class: Acute   Leg pain, bilateral 08/19/2013   Hypothyroidism 07/31/2013   History of cocaine abuse ([name]) 06/13/2013   Long term current use of anticoagulant therapy 05/20/2013   Alcohol abuse    Narcotic abuse ([name])    Marijuana abuse    Alcoholic cirrhosis ([name])    Insulin  dependent type 2 diabetes mellitus ([name])    PCP:  Maryellen Snare, NP Pharmacy:   [facility] - [place], [place] - [address] [place] [address] Phone: [phone] Fax: [phone]  [facility] - [place], [place] - [address] [place] [address] Phone: [phone] Fax: [phone]  Social Drivers of Health (SDOH) Social History: SDOH Screenings   Food Insecurity: No Food Insecurity (03/09/2024)  Housing: Low Risk  (03/09/2024)  Transportation Needs: Unmet Transportation Needs (03/09/2024)  Utilities: Not At Risk (03/09/2024)  Alcohol Screen: Low Risk  (01/28/2020)  Depression (PHQ2-9): Low Risk  (02/09/2023)  Financial Resource Strain: Low Risk  (02/09/2023)  Physical Activity: Inactive (02/09/2023)  Social Connections: Unknown (08/21/2023)   Received from [facility]  Stress: No Stress Concern Present (02/09/2023)  Tobacco Use: High Risk (03/08/2024)     Readmission Risk Interventions    03/12/2024    4:08 PM 09/27/2023    3:52 PM  Readmission Risk Prevention Plan  Transportation Screening Complete Complete  Medication Review (RN Care Manager) Referral to Pharmacy Referral to Pharmacy  PCP or Specialist appointment within 3-5 days of discharge Complete Complete  HRI or Home Care Consult Complete Complete  SW Recovery Care/Counseling Consult Complete Complete  Palliative Care Screening Not Applicable Not Applicable  Skilled Nursing Facility Not Applicable Not Applicable

## 2024-03-12 NOTE — Progress Notes (Signed)
 Pt still in Afib rvr. Informed Dr. Achilles Holes know patient HR still in 150's at 2050 and he ordered digoxin 0.25 mg IV. Pt's HR still up in 130-140's but BP went up from previous NS 1000 bolus so gave Metoprolol  5 mg IV at 2130. Patient's HR was still up in 140-150's. Dr. Achilles Holes ordered NS 250 mL bolus and Amiodarone  150 mg bolus and potassium chloride  40 mEq. After bolus' completed started Amiodarone  at 33.3 mL/hr. Dr. Donna Fus was also consulted and said Afib possibly due to GI bleed. Pt HR now 110-120's. Pt is comfortable and sleeping. Will continue to monitor.

## 2024-03-13 ENCOUNTER — Encounter (HOSPITAL_COMMUNITY): Payer: Self-pay | Admitting: Internal Medicine

## 2024-03-13 ENCOUNTER — Encounter (HOSPITAL_COMMUNITY)
Admission: EM | Disposition: A | Discharge status: Home / Self Care | Payer: Self-pay | Source: Home / Self Care | Attending: Internal Medicine

## 2024-03-13 ENCOUNTER — Inpatient Hospital Stay (HOSPITAL_COMMUNITY)

## 2024-03-13 DIAGNOSIS — J449 Chronic obstructive pulmonary disease, unspecified: Secondary | ICD-10-CM | POA: Diagnosis not present

## 2024-03-13 DIAGNOSIS — I10 Essential (primary) hypertension: Secondary | ICD-10-CM | POA: Diagnosis not present

## 2024-03-13 DIAGNOSIS — K921 Melena: Secondary | ICD-10-CM | POA: Diagnosis not present

## 2024-03-13 DIAGNOSIS — B3781 Candidal esophagitis: Secondary | ICD-10-CM | POA: Diagnosis not present

## 2024-03-13 DIAGNOSIS — I5043 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) and diastolic (congestive) heart failure: Secondary | ICD-10-CM | POA: Diagnosis not present

## 2024-03-13 DIAGNOSIS — I739 Peripheral vascular disease, unspecified: Secondary | ICD-10-CM | POA: Diagnosis not present

## 2024-03-13 DIAGNOSIS — D649 Anemia, unspecified: Secondary | ICD-10-CM | POA: Diagnosis not present

## 2024-03-13 DIAGNOSIS — K229 Disease of esophagus, unspecified: Secondary | ICD-10-CM | POA: Diagnosis not present

## 2024-03-13 DIAGNOSIS — R079 Chest pain, unspecified: Secondary | ICD-10-CM | POA: Diagnosis not present

## 2024-03-13 DIAGNOSIS — I480 Paroxysmal atrial fibrillation: Secondary | ICD-10-CM | POA: Diagnosis not present

## 2024-03-13 HISTORY — PX: ESOPHAGOGASTRODUODENOSCOPY: SHX5428

## 2024-03-13 LAB — BPAM RBC
Blood Product Expiration Date: 202505242359
ISSUE DATE / TIME: 202504291250
Unit Type and Rh: 6200

## 2024-03-13 LAB — TYPE AND SCREEN
ABO/RH(D): A POS
Antibody Screen: NEGATIVE
Unit division: 0

## 2024-03-13 LAB — HEMOGLOBIN AND HEMATOCRIT, BLOOD
HCT: 24 % — ABNORMAL LOW (ref 36.0–46.0)
HCT: 26.2 % — ABNORMAL LOW (ref 36.0–46.0)
Hemoglobin: 7.9 g/dL — ABNORMAL LOW (ref 12.0–15.0)
Hemoglobin: 8.6 g/dL — ABNORMAL LOW (ref 12.0–15.0)

## 2024-03-13 LAB — GLUCOSE, CAPILLARY
Glucose-Capillary: 209 mg/dL — ABNORMAL HIGH (ref 70–99)
Glucose-Capillary: 196 mg/dL — ABNORMAL HIGH (ref 70–99)
Glucose-Capillary: 329 mg/dL — ABNORMAL HIGH (ref 70–99)
Glucose-Capillary: 296 mg/dL — ABNORMAL HIGH (ref 70–99)

## 2024-03-13 LAB — BASIC METABOLIC PANEL WITH GFR
Anion gap: 7 (ref 5–15)
BUN: 50 mg/dL — ABNORMAL HIGH (ref 8–23)
CO2: 21 mmol/L — ABNORMAL LOW (ref 22–32)
Calcium: 7.8 mg/dL — ABNORMAL LOW (ref 8.9–10.3)
Chloride: 101 mmol/L (ref 98–111)
Creatinine, Ser: 1.65 mg/dL — ABNORMAL HIGH (ref 0.44–1.00)
GFR, Estimated: 35 mL/min — ABNORMAL LOW (ref >60)
Glucose, Bld: 194 mg/dL — ABNORMAL HIGH (ref 70–99)
Potassium: 3.8 mmol/L (ref 3.5–5.1)
Sodium: 129 mmol/L — ABNORMAL LOW (ref 135–145)

## 2024-03-13 LAB — MAGNESIUM: Magnesium: 2.2 mg/dL (ref 1.7–2.4)

## 2024-03-13 SURGERY — EGD (ESOPHAGOGASTRODUODENOSCOPY)
Anesthesia: Monitor Anesthesia Care

## 2024-03-13 MED ORDER — NA SULFATE-K SULFATE-MG SULF 17.5-3.13-1.6 GM/177ML PO SOLN
0.5000 | Freq: Once | ORAL | Status: AC
  Administered 2024-03-13: 177 mL via ORAL
  Filled 2024-03-13: qty 1

## 2024-03-13 MED ORDER — FLUCONAZOLE 100 MG PO TABS
100.0000 mg | ORAL_TABLET | Freq: Every day | ORAL | Status: DC
  Administered 2024-03-14 – 2024-03-17 (×4): 100 mg via ORAL
  Filled 2024-03-13 (×4): qty 1

## 2024-03-13 MED ORDER — CALCIUM CHLORIDE 10 % IV SOLN
INTRAVENOUS | Status: DC | PRN
  Administered 2024-03-13: 500 mg via INTRAVENOUS

## 2024-03-13 MED ORDER — GLYCOPYRROLATE 0.2 MG/ML IJ SOLN
INTRAMUSCULAR | Status: DC | PRN
  Administered 2024-03-13: .2 mg via INTRAVENOUS

## 2024-03-13 MED ORDER — SODIUM CHLORIDE 0.9 % IV SOLN
INTRAVENOUS | Status: AC | PRN
  Administered 2024-03-13: 500 mL via INTRAMUSCULAR

## 2024-03-13 MED ORDER — FLUCONAZOLE 200 MG PO TABS
200.0000 mg | ORAL_TABLET | Freq: Once | ORAL | Status: AC
  Administered 2024-03-13: 200 mg via ORAL
  Filled 2024-03-13: qty 1

## 2024-03-13 MED ORDER — LIDOCAINE 2% (20 MG/ML) 5 ML SYRINGE
INTRAMUSCULAR | Status: DC | PRN
  Administered 2024-03-13: 40 mg via INTRAVENOUS

## 2024-03-13 MED ORDER — PROPOFOL 10 MG/ML IV BOLUS
INTRAVENOUS | Status: DC | PRN
  Administered 2024-03-13: 40 mg via INTRAVENOUS

## 2024-03-13 MED ORDER — PROPOFOL 500 MG/50ML IV EMUL
INTRAVENOUS | Status: DC | PRN
  Administered 2024-03-13: 75 ug/kg/min via INTRAVENOUS

## 2024-03-13 MED ORDER — SIMETHICONE 80 MG PO CHEW
240.0000 mg | CHEWABLE_TABLET | Freq: Once | ORAL | Status: AC
  Administered 2024-03-13: 240 mg via ORAL
  Filled 2024-03-13: qty 3

## 2024-03-13 NOTE — Progress Notes (Signed)
 PT Cancellation Note  Patient Details Name: [name] MRN: [id number] DOB: 1961/06/03   Cancelled Treatment:    Reason Eval/Treat Not Completed: (P) Patient at procedure or test/unavailable Pt is off floor for EDG. PT will follow back for treatment as able.   [name] B. Jewel Mortimer PT, DPT Acute Rehabilitation Services Please use secure chat or  Call Office [phone]    Verlie Glisson Fleet 03/13/2024, 10:12 AM

## 2024-03-13 NOTE — Evaluation (Signed)
 Physical Therapy Evaluation Patient Details Name: [name] MRN: [id number] DOB: 08/26/1961 Today's Date: 03/13/2024  History of Present Illness  Pt is a 63 y/o female presents to ED 4/25 via EMS with c/o 2-3 days of SoB, and ongoing chest pain also reports dark stools.Hemoglobin 5.3, Troponin elevated  S/p 4/30 EGD PMH 63 y.o. female with medical history significant of CAD, HFrEF 40 to 45% and grade 2 diastolic heart failure, CAD, CKD stage IV, carotid stenosis s/p endarterectomy, paroxysmal atrial fibrillation, PAD, COPD, chronic smoking cigarette, chronic bradycardia and prior substance abuse history gout,  essential hypertension, insulin -dependent DM type II, hyperlipidemia, and vitamin D  deficiency  Clinical Impression  PTA pt living with her sister in single story apartment with level entry. Pt is currently limited in safe mobility by R sided arthritis pain in shoulder, hand, hip and knee, in presence of generalized weakness and decreased endurance. Pt requires supervision for transfers and contact guard for ambulation to bathroom to and back with RW. PT recommending HHPT at discharge. PT will continue to follow acutely.        If plan is discharge home, recommend the following: A little help with walking and/or transfers;A little help with bathing/dressing/bathroom;Assistance with cooking/housework;Assist for transportation;Help with stairs or ramp for entrance;Supervision due to cognitive status   Can travel by private vehicle    Yes    Equipment Recommendations Rollator (4 wheels) (need to trial Rollator before ordering)     Functional Status Assessment Patient has had a recent decline in their functional status and demonstrates the ability to make significant improvements in function in a reasonable and predictable amount of time.     Precautions / Restrictions Precautions Precautions: Fall Precaution/Restrictions Comments: hx of falls Restrictions Weight Bearing  Restrictions Per Provider Order: No      Mobility  Bed Mobility               General bed mobility comments: sitting in recliner    Transfers Overall transfer level: Needs assistance Equipment used: Standard walker Transfers: Sit to/from Stand Sit to Stand: Supervision           General transfer comment: supervision for safety, vc for hand placement for powerup    Ambulation/Gait Ambulation/Gait assistance: Contact guard assist Gait Distance (Feet): 20 Feet Assistive device: Rolling walker (2 wheels) Gait Pattern/deviations: Step-through pattern, Decreased step length - right, Decreased step length - left, Trunk flexed, Wide base of support, Antalgic, Shuffle Gait velocity: slowed Gait velocity interpretation: <1.31 ft/sec, indicative of household ambulator   General Gait Details: contact guard for safety, able to ambulate from recliner to bathroom and back pt reports that this is her baseline, otherwise uses wheelchair      Balance Overall balance assessment: Needs assistance Sitting-balance support: No upper extremity supported, Feet supported Sitting balance-Leahy Scale: Good     Standing balance support: Bilateral upper extremity supported, During functional activity, Reliant on assistive device for balance Standing balance-Leahy Scale: Poor                               Pertinent Vitals/Pain Pain Assessment Pain Assessment: Faces Pain Score: 10-Worst pain ever Faces Pain Scale: Hurts little more Pain Location: R hip and shoulder Pain Descriptors / Indicators: Moaning, Grimacing, Guarding, Sharp, Sore, Throbbing Pain Intervention(s): Limited activity within patient's tolerance, Repositioned    Home Living Family/patient expects to be discharged to:: Private residence Living Arrangements: Other relatives (sister) Available Help  at Discharge: Family;Available 24 hours/day Type of Home: Apartment Home Access: Level entry       Home  Layout: One level Home Equipment: Shower seat;Cane - quad;Wheelchair - manual (tub lift) Additional Comments: pt would like a scooter, her PCP says she needs to walk more    Prior Function Prior Level of Function : Needs assist             Mobility Comments: pivots to w/c and able to walk 15 feet to and from bathroom ADLs Comments: sister performs iADLs, pt able to perform ADLs at a mod I level     Extremity/Trunk Assessment   Upper Extremity Assessment Upper Extremity Assessment: Defer to OT evaluation    Lower Extremity Assessment Lower Extremity Assessment: RLE deficits/detail;LLE deficits/detail RLE Deficits / Details: R transmet amp, lacks full ROM in knee and hip, strength grossly 3+/5 RLE Sensation: history of peripheral neuropathy LLE Deficits / Details: ROM WFL, strength grossly 3+/5 LLE Sensation: history of peripheral neuropathy    Cervical / Trunk Assessment Cervical / Trunk Assessment: Kyphotic  Communication   Communication Communication: No apparent difficulties    Cognition Arousal: Alert Behavior During Therapy: Flat affect   PT - Cognitive impairments: No apparent impairments                         Following commands: Intact       Cueing Cueing Techniques: Tactile cues, Visual cues, Verbal cues     General Comments General comments (skin integrity, edema, etc.): VSS on RA        Assessment/Plan    PT Assessment Patient needs continued PT services  PT Problem List Decreased strength;Decreased range of motion;Decreased activity tolerance;Decreased balance;Decreased mobility;Decreased coordination;Impaired sensation;Pain       PT Treatment Interventions DME instruction;Gait training;Functional mobility training;Therapeutic activities;Therapeutic exercise;Balance training;Cognitive remediation;Patient/family education    PT Goals (Current goals can be found in the Care Plan section)  Acute Rehab PT Goals PT Goal Formulation:  With patient Time For Goal Achievement: 03/27/24 Potential to Achieve Goals: Fair    Frequency Min 2X/week        AM-PAC PT "6 Clicks" Mobility  Outcome Measure Help needed turning from your back to your side while in a flat bed without using bedrails?: None Help needed moving from lying on your back to sitting on the side of a flat bed without using bedrails?: None Help needed moving to and from a bed to a chair (including a wheelchair)?: A Little Help needed standing up from a chair using your arms (e.g., wheelchair or bedside chair)?: A Little Help needed to walk in hospital room?: A Little Help needed climbing 3-5 steps with a railing? : Total 6 Click Score: 18    End of Session   Activity Tolerance: Patient tolerated treatment well Patient left: in chair;with bed alarm set;with call bell/phone within reach Nurse Communication: Mobility status PT Visit Diagnosis: Muscle weakness (generalized) (M62.81);Difficulty in walking, not elsewhere classified (R26.2);Pain    Time: 1610-9604 PT Time Calculation (min) (ACUTE ONLY): 26 min   Charges:   PT Evaluation $PT Eval Moderate Complexity: 1 Mod PT Treatments $Gait Training: 8-22 mins PT General Charges $$ ACUTE PT VISIT: 1 Visit         Shamon Lobo B. Jewel Mortimer PT, DPT Acute Rehabilitation Services Please use secure chat or  Call Office [phone]   Verlie Glisson [facility] 03/13/2024, 11:39 AM

## 2024-03-13 NOTE — Anesthesia Preprocedure Evaluation (Addendum)
 Anesthesia Evaluation  Patient identified by MRN, date of birth, ID band Patient awake    Reviewed: Allergy & Precautions, H&P , NPO status , Patient's Chart, lab work & pertinent test results, reviewed documented beta blocker date and time   Airway Mallampati: II  TM Distance: >3 FB Neck ROM: Full    Dental no notable dental hx. (+) Edentulous Upper, Edentulous Lower, Dental Advisory Given   Pulmonary shortness of breath, sleep apnea , COPD,  COPD inhaler, Current Smoker and Patient abstained from smoking.   Pulmonary exam normal breath sounds clear to auscultation       Cardiovascular hypertension, Pt. on medications and Pt. on home beta blockers + CAD, + Peripheral Vascular Disease, +CHF and + DOE  + Valvular Problems/Murmurs MR  Rhythm:Regular Rate:Normal     Neuro/Psych   Anxiety Depression    negative neurological ROS     GI/Hepatic ,GERD  Medicated,,(+)     substance abuse  alcohol use and marijuana use  Endo/Other  diabetes, Insulin  DependentHypothyroidism    Renal/GU Renal InsufficiencyRenal disease  negative genitourinary   Musculoskeletal  (+) Arthritis , Osteoarthritis,  narcotic dependent  Abdominal   Peds  Hematology  (+) Blood dyscrasia, anemia   Anesthesia Other Findings   Reproductive/Obstetrics negative OB ROS                             Anesthesia Physical Anesthesia Plan  ASA: 3  Anesthesia Plan: MAC   Post-op Pain Management: Minimal or no pain anticipated   Induction: Intravenous  PONV Risk Score and Plan: 1 and Propofol  infusion  Airway Management Planned: Natural Airway and Simple Face Mask  Additional Equipment:   Intra-op Plan:   Post-operative Plan:   Informed Consent: I have reviewed the patients History and Physical, chart, labs and discussed the procedure including the risks, benefits and alternatives for the proposed anesthesia with the  patient or authorized representative who has indicated his/her understanding and acceptance.     Dental advisory given  Plan Discussed with: CRNA  Anesthesia Plan Comments:        Anesthesia Quick Evaluation

## 2024-03-13 NOTE — Progress Notes (Addendum)
 PROGRESS NOTE    [name]  GNF:[id number] DOB: 07/19/1961 DOA: 03/08/2024 PCP: Maryellen Snare, NP   Brief Narrative: 63 year old past medical history significant for CAD, heart failure reduced ejection fraction, diastolic heart failure, CAD, CKD stage IV, carotid stenosis status post endarterectomy, PAF, PAD, COPD, ongoing tobacco use, chronic bradycardia, gout, hypertension, diabetes type 2, hyperlipidemia, vitamin D  deficiency presented with chest pain.  Received nitroglycerin  by EMS.  She also complaining of melena.  She does take BC powder approximately 2 times per month.  Was found to have a hemoglobin of 5.3 on admission, BNP 666, rectal exam performed in the ED showed melena.  GI and cardiology following     Assessment & Plan:   Principal Problem:   GI bleed Active Problems:   Paroxysmal atrial fibrillation ([name])   Demand ischemia ([name])   CKD (chronic kidney disease), stage IV ([name])   S/P peripheral artery angioplasty - TurboHawk atherectomy; R SFA   Symptomatic anemia   HFrEF (heart failure with reduced ejection fraction) ([name])   Insulin  dependent type 2 diabetes mellitus ([name])   Peripheral artery disease ([name])   Carotid artery stenosis   COPD (chronic obstructive pulmonary disease) ([name])   Hyperlipidemia   Hypothyroidism   Chronic sinus bradycardia   PAF (paroxysmal atrial fibrillation) ([name])   History of CAD (coronary artery disease)   Continuous dependence on cigarette smoking   GAD (generalized anxiety disorder)   Heme positive stool   Chronic systolic congestive heart failure ([name])   Melena   1-GI bleed, acute blood loss anemia Patient presented with a hemoglobin of 5.3, presented with melena She has received 2 units of packed red blood cells on 4/26. Hemoglobin down to 7.4 another unit of packed red blood cells was ordered Hemoglobin today 7.9 GI following, endoscopy was delayed until hemoglobin was stable above 7. Underwent endoscopy 4/30: Esophageal  plaques were found consistent with candidiasis.  Normal stomach.  Normal examined duodenal. -No source of bleeding on endoscopy plan to proceed with colonoscopy tomorrow   Demand ischemia, troponin elevation: Evaluated by cardiology not cath candidate due to active bleeding.  No indication for cath per cardiology Cardiology consulted per anesthesiology request for clearance for endoscopy procedure. Echo performed ejection fraction 35 to 40%, global hypokinesis.  Mildly decreased in ejection fraction compared to 2024.  Hemic evaluation deferred by cardiology currently.  Chronic systolic heart failure appreciate cardiology follow-up  PAF holding Eliquis  in the setting of GI bleed.  Currently on IV amiodarone  Cardiology following  PVD with significant LAD dx post left femoral pop right PCI and left CEA  nsulin dependent type 2 diabetes mellitus ([name]) - Last A1c 12.6% on 01/23/2024 - Continue on SSI and CBG monitoring for now   Peripheral artery disease ([name]) - Eliquis  on hold - Continue Crestor    COPD (chronic obstructive pulmonary disease) ([name]) - No signs/symptoms of exacerbation   Hyperlipidemia - Continue Crestor    Hypothyroidism - Continue Synthroid    GAD (generalized anxiety disorder) - Continue Wellbutrin    History of CAD (coronary artery disease) - Eliquis  on hold - Continue statin, Toprol      Estimated body mass index is 38.19 kg/m as calculated from the following:   Height as of this encounter: 5\' 3"  (1.6 m).   Weight as of this encounter: 97.8 kg.   DVT prophylaxis: CDs Code Status: Full code Family Communication: Disposition Plan:  Status is: Inpatient Remains inpatient appropriate because: Management of GI bleed    Consultants:  GI Cardiology  Procedures:  Echo  Antimicrobials:    Subjective: She is alert, she is hungry, she just came from endoscopy.  She is aware that she is to have a colonoscopy tomorrow No BM today    Objective: Vitals:   03/12/24 1937 03/13/24 0220 03/13/24 0443 03/13/24 0803  BP: 126/63 114/64 (!) 114/54   Pulse: 72 76 67   Resp: 18 20 20    Temp: 98.9 F (37.2 C)  (!) 97.4 F (36.3 C)   TempSrc: Oral  Oral   SpO2: 100% 98% (!) 88% 92%  Weight:      Height:        Intake/Output Summary (Last 24 hours) at 03/13/2024 0818 Last data filed at 03/12/2024 2040 Gross per 24 hour  Intake 491.48 ml  Output 200 ml  Net 291.48 ml   Filed Weights   03/09/24 1101  Weight: 97.8 kg    Examination:  General exam: Appears calm and comfortable  Respiratory system: Clear to auscultation. Respiratory effort normal. Cardiovascular system: S1 & S2 heard, RRR. No JVD, murmurs, rubs, gallops or clicks. No pedal edema. Gastrointestinal system: Abdomen is nondistended, soft and nontender. No organomegaly or masses felt. Normal bowel sounds heard. Central nervous system: Alert and oriented.  Extremities: Symmetric 5 x 5 power.    Data Reviewed: I have personally reviewed following labs and imaging studies  CBC: Recent Labs  Lab 03/08/24 1828 03/09/24 0209 03/09/24 0514 03/09/24 1238 03/11/24 1856 03/11/24 2342 03/12/24 0454 03/12/24 1827 03/13/24 0459  WBC 12.3*  --  11.1*  --   --   --   --   --   --   NEUTROABS 9.5*  --   --   --   --   --   --   --   --   HGB 5.3*   < > 7.4*   < > 8.7* 7.2* 7.4* 9.0* 7.9*  HCT 17.9*   < > 23.1*   < > 26.8* 22.8* 22.7* 27.9* 24.0*  MCV 105.9*  --  97.5  --   --   --   --   --   --   PLT 218  --  222  --   --   --   --   --   --    < > = values in this interval not displayed.   Basic Metabolic Panel: Recent Labs  Lab 03/09/24 0514 03/10/24 0910 03/11/24 0518 03/12/24 0454 03/13/24 0459  NA 141 139 136 133* 129*  K 4.2 3.9 3.8 4.8 3.8  CL 103 108 104 105 101  CO2 29 23 23  17* 21*  GLUCOSE 177* 89 134* 218* 194*  BUN 93* 59* 45* 47* 50*  CREATININE 2.31* 1.48* 1.50* 1.76* 1.65*  CALCIUM  8.9 8.5* 8.5* 7.9* 7.8*  MG  --  2.3 2.1  2.0 2.2   GFR: Estimated Creatinine Clearance: 39.9 mL/min (A) (by C-G formula based on SCr of 1.65 mg/dL (H)). Liver Function Tests: Recent Labs  Lab 03/08/24 1828 03/09/24 0514  AST 19 20  ALT 13 11  ALKPHOS 66 54  BILITOT 0.5 0.7  PROT 6.3* 5.3*  ALBUMIN  3.0* 2.6*   No results for input(s): "LIPASE", "AMYLASE" in the last 168 hours. No results for input(s): "AMMONIA" in the last 168 hours. Coagulation Profile: Recent Labs  Lab 03/09/24 0514  INR 1.3*   Cardiac Enzymes: No results for input(s): "CKTOTAL", "CKMB", "CKMBINDEX", "TROPONINI" in the last 168 hours. BNP (last 3 results) No results  for input(s): "PROBNP" in the last 8760 hours. HbA1C: No results for input(s): "HGBA1C" in the last 72 hours. CBG: Recent Labs  Lab 03/12/24 0722 03/12/24 1126 03/12/24 1617 03/12/24 1941 03/13/24 0746  GLUCAP 201* 180* 253* 334* 209*   Lipid Profile: No results for input(s): "CHOL", "HDL", "LDLCALC", "TRIG", "CHOLHDL", "LDLDIRECT" in the last 72 hours. Thyroid  Function Tests: No results for input(s): "TSH", "T4TOTAL", "FREET4", "T3FREE", "THYROIDAB" in the last 72 hours. Anemia Panel: No results for input(s): "VITAMINB12", "FOLATE", "FERRITIN", "TIBC", "IRON ", "RETICCTPCT" in the last 72 hours. Sepsis Labs: No results for input(s): "PROCALCITON", "LATICACIDVEN" in the last 168 hours.  No results found for this or any previous visit (from the past 240 hours).       Radiology Studies: ECHOCARDIOGRAM COMPLETE Result Date: 03/11/2024    ECHOCARDIOGRAM REPORT   Patient Name:   CLEONE THURGOOD Date of Exam: 03/11/2024 Medical Rec #:  [id number]     Height:       63.0 in Accession #:    [id number]    Weight:       215.6 lb Date of Birth:  [birth year]    BSA:          1.996 m Patient Age:    62 years      BP:           131/65 mmHg Patient Gender: F             HR:           86 bpm. Exam Location:  Inpatient Procedure: 2D Echo, Cardiac Doppler, Color Doppler and Intracardiac             Opacification Agent (Both Spectral and Color Flow Doppler were            utilized during procedure). Indications:    R07.9* Chest pain, unspecified  History:        Patient has prior history of Echocardiogram examinations, most                 recent 09/27/2023. CHF, CAD, Abnormal ECG, COPD,                 Arrythmias:Atrial Fibrillation, Signs/Symptoms:Dyspnea and                 Shortness of Breath; Risk Factors:Diabetes, Dyslipidemia,                 Current Smoker and Sleep Apnea. ETOH. Substance abuse.  Sonographer:    Raynelle Callow RDCS Referring Phys: [id number] Awilda Bogus WEAVER  Sonographer Comments: Technically difficult study due to poor echo windows, patient is obese and suboptimal parasternal window. Image acquisition challenging due to patient body habitus. IMPRESSIONS  1. Left ventricular ejection fraction, by estimation, is 35 to 40%. The left ventricle has moderately decreased function. The left ventricle demonstrates global hypokinesis. The left ventricular internal cavity size was upper limit of normal. Left ventricular diastolic parameters are consistent with Grade I diastolic dysfunction (impaired relaxation).  2. Right ventricular systolic function is mildly reduced. The right ventricular size is normal. There is moderately elevated pulmonary artery systolic pressure. The estimated right ventricular systolic pressure is 55.9 mmHg.  3. The mitral valve is degenerative. Moderate mitral valve regurgitation. No evidence of mitral stenosis.  4. The aortic valve was not well visualized. Aortic valve regurgitation is not visualized. No aortic stenosis is present.  5. The inferior vena cava is dilated in size with >50% respiratory variability, suggesting right atrial pressure  of 8 mmHg. Comparison(s): A prior study was performed on 09/27/2023. Reduction in LVEF from 45-50% to 35-40% and RWMAs. FINDINGS  Left Ventricle: Left ventricular ejection fraction, by estimation, is 35 to 40%. The left ventricle has  moderately decreased function. The left ventricle demonstrates global hypokinesis. Definity  contrast agent was given IV to delineate the left ventricular endocardial borders. The left ventricular internal cavity size was upper limit of normal. There is no left ventricular hypertrophy. Left ventricular diastolic parameters are consistent with Grade I diastolic dysfunction (impaired relaxation).  LV Wall Scoring: The mid inferoseptal segment, apical septal segment, apical anterior segment, apical inferior segment, and apex are hypokinetic. Right Ventricle: The right ventricular size is normal. No increase in right ventricular wall thickness. Right ventricular systolic function is mildly reduced. There is moderately elevated pulmonary artery systolic pressure. The tricuspid regurgitant velocity is 3.46 m/s, and with an assumed right atrial pressure of 8 mmHg, the estimated right ventricular systolic pressure is 55.9 mmHg. Left Atrium: Left atrial size was normal in size. Right Atrium: Right atrial size was normal in size. Pericardium: There is no evidence of pericardial effusion. Mitral Valve: The mitral valve is degenerative in appearance. Mild mitral annular calcification. Moderate mitral valve regurgitation, with centrally-directed jet. No evidence of mitral valve stenosis. Tricuspid Valve: The tricuspid valve is grossly normal. Tricuspid valve regurgitation is mild . No evidence of tricuspid stenosis. Aortic Valve: The aortic valve was not well visualized. Aortic valve regurgitation is not visualized. No aortic stenosis is present. Pulmonic Valve: The pulmonic valve was not well visualized. Pulmonic valve regurgitation is not visualized. No evidence of pulmonic stenosis. Aorta: The aortic root and ascending aorta are structurally normal, with no evidence of dilitation. Venous: The inferior vena cava is dilated in size with greater than 50% respiratory variability, suggesting right atrial pressure of 8 mmHg.  IAS/Shunts: There is redundancy of the interatrial septum. The interatrial septum was not well visualized.  LEFT VENTRICLE PLAX 2D LVIDd:         5.80 cm      Diastology LVIDs:         5.30 cm      LV e' medial:    7.18 cm/s LV PW:         0.90 cm      LV E/e' medial:  14.1 LV IVS:        1.10 cm      LV e' lateral:   10.60 cm/s LVOT diam:     2.30 cm      LV E/e' lateral: 9.5 LV SV:         64 LV SV Index:   32 LVOT Area:     4.15 cm  LV Volumes (MOD) LV vol d, MOD A2C: 187.0 ml LV vol d, MOD A4C: 155.0 ml LV vol s, MOD A2C: 122.0 ml LV vol s, MOD A4C: 106.0 ml LV SV MOD A2C:     65.0 ml LV SV MOD A4C:     155.0 ml LV SV MOD BP:      52.7 ml RIGHT VENTRICLE            IVC RV S prime:     9.57 cm/s  IVC diam: 2.20 cm TAPSE (M-mode): 1.3 cm LEFT ATRIUM             Index        RIGHT ATRIUM           Index LA diam:  3.20 cm 1.60 cm/m   RA Area:     12.30 cm LA Vol (A2C):   53.8 ml 26.95 ml/m  RA Volume:   24.90 ml  12.47 ml/m LA Vol (A4C):   35.8 ml 17.93 ml/m LA Biplane Vol: 45.1 ml 22.59 ml/m  AORTIC VALVE LVOT Vmax:   80.70 cm/s LVOT Vmean:  55.400 cm/s LVOT VTI:    0.153 m  AORTA Ao Root diam: 3.30 cm Ao Asc diam:  3.40 cm MITRAL VALVE                  TRICUSPID VALVE MV Area (PHT): 4.39 cm       TR Peak grad:   47.9 mmHg MV Decel Time: 173 msec       TR Vmax:        346.00 cm/s MR Peak grad:    119.7 mmHg MR Mean grad:    77.0 mmHg    SHUNTS MR Vmax:         547.00 cm/s  Systemic VTI:  0.15 m MR Vmean:        413.0 cm/s   Systemic Diam: 2.30 cm MR PISA:         1.57 cm MR PISA Eff ROA: 11 mm MR PISA Radius:  0.50 cm MV E velocity: 101.00 cm/s MV A velocity: 83.00 cm/s MV E/A ratio:  1.22 Sunit Tolia Electronically signed by [employer] Signature Date/Time: 03/11/2024/1:31:54 PM    Final         Scheduled Meds:  allopurinol   150 mg Oral Daily   buPROPion   300 mg Oral q morning   docusate sodium   200 mg Oral QHS   gabapentin   200 mg Oral TID   insulin  aspart  0-5 Units Subcutaneous QHS    insulin  aspart  0-9 Units Subcutaneous TID WC   levothyroxine   50 mcg Oral Q0600   metoprolol  succinate  25 mg Oral Daily   mometasone -formoterol   2 puff Inhalation BID   pantoprazole  (PROTONIX ) IV  40 mg Intravenous Q12H   polyethylene glycol  17 g Oral Daily   rosuvastatin   40 mg Oral Daily   sodium chloride  flush  3 mL Intravenous Q12H   sodium chloride  flush  3 mL Intravenous Q12H   Continuous Infusions:  amiodarone  30 mg/hr (03/13/24 0123)     LOS: 5 days    Time spent: 35 minutes    Coraline Talwar A Yechezkel Fertig, MD [facility]   If 7PM-7AM, please contact night-coverage [email or web address]  03/13/2024, 8:18 AM

## 2024-03-13 NOTE — Interval H&P Note (Signed)
 History and Physical Interval Note: For upper endoscopy this morning to evaluate dark melenic stool/heme positive stool HIGHER THAN BASELINE RISK.The nature of the procedure, as well as the risks, benefits, and alternatives were carefully and thoroughly reviewed with the patient. Ample time for discussion and questions allowed. The patient understood, was satisfied, and agreed to proceed.      Latest Ref Rng & Units 03/13/2024    4:59 AM 03/12/2024    6:27 PM 03/12/2024    4:54 AM  CBC  Hemoglobin 12.0 - 15.0 g/dL 7.9  9.0  7.4   Hematocrit 36.0 - 46.0 % 24.0  27.9  22.7     03/13/2024 9:37 AM  [name]  has presented today for surgery, with the diagnosis of melena and anemia.  The various methods of treatment have been discussed with the patient and family. After consideration of risks, benefits and other options for treatment, the patient has consented to  Procedure(s): EGD (ESOPHAGOGASTRODUODENOSCOPY) (N/A) as a surgical intervention.  The patient's history has been reviewed, patient examined, no change in status, stable for surgery.  I have reviewed the patient's chart and labs.  Questions were answered to the patient's satisfaction.     Amber Bail [name]

## 2024-03-13 NOTE — Anesthesia Postprocedure Evaluation (Signed)
 Anesthesia Post Note  Patient: [name]  Procedure(s) Performed: EGD (ESOPHAGOGASTRODUODENOSCOPY)     Patient location during evaluation: Endoscopy Anesthesia Type: MAC Level of consciousness: awake and alert Pain management: pain level controlled Vital Signs Assessment: post-procedure vital signs reviewed and stable Respiratory status: spontaneous breathing, nonlabored ventilation and respiratory function stable Cardiovascular status: stable and blood pressure returned to baseline Postop Assessment: no apparent nausea or vomiting Anesthetic complications: no  No notable events documented.  Last Vitals:  Vitals:   03/13/24 1030 03/13/24 1147  BP: (!) 105/54 (!) 145/66  Pulse: 75 72  Resp: 18 18  Temp:  36.6 C  SpO2: 98% 98%    Last Pain:  Vitals:   03/13/24 1147  TempSrc: Oral  PainSc:                  Chue Berkovich,W. EDMOND

## 2024-03-13 NOTE — Evaluation (Signed)
 Occupational Therapy Evaluation Patient Details Name: [name] MRN: [id number] DOB: 29-Mar-1961 Today's Date: 03/13/2024   History of Present Illness   Pt is a 63 y/o female presents to ED 4/25 via EMS with c/o 2-3 days of SoB, and ongoing chest pain also reports dark stools.Hemoglobin 5.3, Troponin elevated. Found to be anemic and FOBT+. S/p 4/30 EGD PMH: CAD, HFrEF 40 to 45% and grade 2 diastolic heart failure, CAD, CKD stage IV, carotid stenosis s/p endarterectomy, paroxysmal AFib, PAD, COPD, GERD, smoker, chronic bradycardia, prior substance abuse, gout, HTN, DM type II, HLD, and vitamin D  deficiency     Clinical Impressions At baseline, pt is Mod I with ADLs and light meal prep, performs functional mobility up to approx. 15" or uses a manual wheelchair for mobility, and receives assistance from her sister for IADLs. Pt now presents with decreases activity tolerance, decreased balance, generalized B UE weakness, pain affecting functional level, impaired safety awareness, and decreased safety and independence with functional tasks. Pt participated well in session and is motivated to return to PLOF. Pt VSS on RA throughout session. Pt will benefit from acute skilled OT services to address deficits outlined below and increase safety and independence with functional tasks. Post acute discharge, pt will benefit from [facility] OT for a home safety assessment, to maximize rehab potential, decreased risk of falls, and decrease risk of rehospitalization.     If plan is discharge home, recommend the following:   A little help with walking and/or transfers;A lot of help with bathing/dressing/bathroom;Assistance with cooking/housework;Assistance with feeding;Assist for transportation;Help with stairs or ramp for entrance (Set up for self feeding)     Functional Status Assessment   Patient has had a recent decline in their functional status and demonstrates the ability to make significant improvements  in function in a reasonable and predictable amount of time.     Equipment Recommendations   Other (comment) Aeronautical engineer)     Recommendations for Other Services         Precautions/Restrictions   Precautions Precautions: Fall Precaution/Restrictions Comments: hx of falls Restrictions Weight Bearing Restrictions Per Provider Order: No     Mobility Bed Mobility               General bed mobility comments: sitting in recliner at beginning and end of session    Transfers Overall transfer level: Needs assistance Equipment used: Rollator (4 wheels) Transfers: Sit to/from Stand, Bed to chair/wheelchair/BSC Sit to Stand: Supervision, Contact guard assist     Step pivot transfers: Contact guard assist, Min assist     General transfer comment: STS x4 during session; initial cues for hand placement with STS but fading to no cues needed during session; Largely CGA for safety with intermittent need for Min assist in transfers/ambulation with use of Rollator to steady      Balance Overall balance assessment: Needs assistance Sitting-balance support: No upper extremity supported, Feet supported Sitting balance-Leahy Scale: Good     Standing balance support: Single extremity supported, Bilateral upper extremity supported, During functional activity, Reliant on assistive device for balance Standing balance-Leahy Scale: Poor                             ADL either performed or assessed with clinical judgement   ADL Overall ADL's : Needs assistance/impaired Eating/Feeding: Set up;Sitting Eating/Feeding Details (indicate cue type and reason): assistance with opening containers Grooming: Set up;Contact guard assist;Sitting   Upper Body Bathing:  Contact guard assist;Minimal assistance;Sitting   Lower Body Bathing: Minimal assistance;Moderate assistance;Cueing for compensatory techniques;Sit to/from stand   Upper Body Dressing : Contact guard assist;Sitting    Lower Body Dressing: Minimal assistance;Sit to/from stand   Toilet Transfer: Contact guard assist;Minimal assistance;Ambulation;BSC/3in1;Rollator (4 wheels) Toilet Transfer Details (indicate cue type and reason): simulated at chair; no cues needed for locking/unlocking Rollator brakes Toileting- Clothing Manipulation and Hygiene: Minimal assistance;Moderate assistance;Sit to/from stand;Cueing for compensatory techniques       Functional mobility during ADLs: Contact guard assist;Minimal assistance;Cueing for safety;Rollator (4 wheels) (No cues needed for locking/unlocking brakes) General ADL Comments: Pt with decreased activity tolerance, fatiguing quickly during tasks     Vision Baseline Vision/History: 1 Wears glasses (readers) Ability to See in Adequate Light: 0 Adequate (with glasses for reading) Patient Visual Report: No change from baseline       Perception         Praxis         Pertinent Vitals/Pain Pain Assessment Pain Assessment: Faces Faces Pain Scale: Hurts a little bit Pain Location: R hip, R shoulder, R wrist Pain Descriptors / Indicators: Grimacing, Guarding, Sore, Sharp Pain Intervention(s): Limited activity within patient's tolerance, Monitored during session, Repositioned     Extremity/Trunk Assessment Upper Extremity Assessment Upper Extremity Assessment: Left hand dominant;Generalized weakness;RUE deficits/detail;LUE deficits/detail RUE Deficits / Details: generalized weakness; noted brusing and redness throughout UE; mildly edematous hand and wrist; pain in shoulder and wrist with AROM and during functional tasks LUE Deficits / Details: generalized weakness; noted brusing and redness throughout UE   Lower Extremity Assessment Lower Extremity Assessment: Defer to PT evaluation   Cervical / Trunk Assessment Cervical / Trunk Assessment: Kyphotic   Communication Communication Communication: No apparent difficulties   Cognition Arousal:  Alert Behavior During Therapy: Flat affect Cognition: Cognition impaired, No family/caregiver present to determine baseline     Awareness: Intellectual awareness intact, Online awareness intact   Attention impairment (select first level of impairment): Divided attention Executive functioning impairment (select all impairments): Problem solving, Organization OT - Cognition Comments: Pt AAOx4 and pleasant throughout session. Pt cognition largely [facility] for tasks assessed but with decreased safety awareness and mild cognitive deficits noted above.                 Following commands: Intact       Cueing  General Comments   Cueing Techniques: Verbal cues;Visual cues  VSS on RA throughout session.   Exercises     Shoulder Instructions      Home Living Family/patient expects to be discharged to:: Private residence Living Arrangements: Other relatives (sister) Available Help at Discharge: Family;Available 24 hours/day Type of Home: Apartment Home Access: Level entry     Home Layout: One level     Bathroom Shower/Tub: Chief Strategy Officer: Handicapped height Bathroom Accessibility: Yes How Accessible: Accessible via wheelchair Home Equipment: Shower seat;Cane - quad;Wheelchair - manual;Other (comment) (tub lift)          Prior Functioning/Environment Prior Level of Function : Needs assist             Mobility Comments: pivots to w/c and able to walk 15 feet to and from bathroom ADLs Comments: Pt Mod I with ADLs and light meal prep. Sister assists with all other IADLs.    OT Problem List: Decreased strength;Decreased activity tolerance;Impaired balance (sitting and/or standing);Decreased safety awareness   OT Treatment/Interventions: Self-care/ADL training;Therapeutic exercise;Energy conservation;DME and/or AE instruction;Therapeutic activities;Patient/family education;Balance training  OT Goals(Current goals can be found in the care plan  section)   Acute Rehab OT Goals Patient Stated Goal: to return home and have less pain OT Goal Formulation: With patient Time For Goal Achievement: 03/27/24 Potential to Achieve Goals: Good ADL Goals Pt Will Perform Grooming: with modified independence;standing Pt Will Perform Lower Body Bathing: with supervision;sitting/lateral leans;sit to/from stand Pt Will Perform Lower Body Dressing: sitting/lateral leans;sit to/from stand;with modified independence Pt Will Transfer to Toilet: with modified independence;ambulating;regular height toilet (with least restrictive AD) Pt Will Perform Toileting - Clothing Manipulation and hygiene: with modified independence;sitting/lateral leans;sit to/from stand Pt/caregiver will Perform Home Exercise Program: Increased strength;Both right and left upper extremity;With theraband;With theraputty;Independently;With written HEP provided (increased activity tolerance) Additional ADL Goal #1: Patient will demonstrate ability to Independently state 3 energy conservation strategies to increase safety and independence with functional tasks.   OT Frequency:  Min 2X/week    Co-evaluation              AM-PAC OT "6 Clicks" Daily Activity     Outcome Measure Help from another person eating meals?: A Little Help from another person taking care of personal grooming?: A Little Help from another person toileting, which includes using toliet, bedpan, or urinal?: A Lot Help from another person bathing (including washing, rinsing, drying)?: A Lot Help from another person to put on and taking off regular upper body clothing?: A Little Help from another person to put on and taking off regular lower body clothing?: A Little 6 Click Score: 16   End of Session Equipment Utilized During Treatment: Gait belt;Rollator (4 wheels) Nurse Communication: Mobility status;Other (comment) (VSS on RA)  Activity Tolerance: Patient tolerated treatment well Patient left: in  chair;with call bell/phone within reach;with chair alarm set  OT Visit Diagnosis: Unsteadiness on feet (R26.81);Other abnormalities of gait and mobility (R26.89);History of falling (Z91.81);Muscle weakness (generalized) (M62.81);Other (comment) (decreased activity tolerance)                Time: 1515-1540 OT Time Calculation (min): 25 min Charges:  OT General Charges $OT Visit: 1 Visit OT Evaluation $OT Eval Low Complexity: 1 Low OT Treatments $Self Care/Home Management : 8-22 mins  Jamella Grayer "Darral Ellis., OTR/L, MA Acute Rehab [phone]   Walt Gunner 03/13/2024, 6:20 PM

## 2024-03-13 NOTE — TOC Progression Note (Signed)
 Transition of Care [facility]) - Progression Note    Patient Details  Name: [name] MRN: [id number] Date of Birth: Apr 13, 1961  [facility] CM/SW Contact  Graves-Bigelow, Jari Merles, RN Phone Number: 03/13/2024, 4:38 PM  Clinical Narrative: Case Manager spoke with patient regarding PT recommendations and she declined home health services. Patient is agreeable to the rollator. Patient has no agency preference and wants to use [employer]. Referral submitted and DME will be delivered to the room. No further home needs identified at this time. Case Manager will continue to follow for additional transition of care needs.     Expected Discharge Plan: Home/Self Care Barriers to Discharge: Continued Medical Work up  Expected Discharge Plan and Services In-house Referral: Clinical Social Work Discharge Planning Services: CM Consult   Living arrangements for the past 2 months: Apartment  Social Determinants of Health (SDOH) Interventions SDOH Screenings   Food Insecurity: No Food Insecurity (03/09/2024)  Housing: Low Risk  (03/09/2024)  Transportation Needs: Unmet Transportation Needs (03/09/2024)  Utilities: Not At Risk (03/09/2024)  Alcohol Screen: Low Risk  (01/28/2020)  Depression (PHQ2-9): Low Risk  (02/09/2023)  Financial Resource Strain: Low Risk  (02/09/2023)  Physical Activity: Inactive (02/09/2023)  Social Connections: Unknown (08/21/2023)   Received from [facility]  Stress: No Stress Concern Present (02/09/2023)  Tobacco Use: High Risk (03/13/2024)   Readmission Risk Interventions    03/12/2024    4:08 PM 09/27/2023    3:52 PM  Readmission Risk Prevention Plan  Transportation Screening Complete Complete  Medication Review (RN Care Manager) Referral to Pharmacy Referral to Pharmacy  PCP or Specialist appointment within 3-5 days of discharge Complete Complete  HRI or Home Care Consult Complete Complete  SW Recovery Care/Counseling Consult Complete Complete  Palliative  Care Screening Not Applicable Not Applicable  Skilled Nursing Facility Not Applicable Not Applicable

## 2024-03-13 NOTE — Transfer of Care (Signed)
 Immediate Anesthesia Transfer of Care Note  Patient: [name]  Procedure(s) Performed: EGD (ESOPHAGOGASTRODUODENOSCOPY)  Patient Location: Endoscopy Unit  Anesthesia Type:MAC  Level of Consciousness: drowsy  Airway & Oxygen  Therapy: Patient Spontanous Breathing  Post-op Assessment: Report given to RN and Post -op Vital signs reviewed and stable  Post vital signs: Reviewed and stable  Last Vitals:  Vitals Value Taken Time  BP    Temp    Pulse 74 03/13/24 1007  Resp 16 03/13/24 1007  SpO2 98 % 03/13/24 1007  Vitals shown include unfiled device data.  Last Pain:  Vitals:   03/13/24 0920  TempSrc: Temporal  PainSc: 8       Patients Stated Pain Goal: 5 (03/09/24 1256)  Complications: No notable events documented.

## 2024-03-13 NOTE — Progress Notes (Signed)
 Cardiologist:  Camnits/McLean  Subjective:   Large black stool yesterday  Objective:  Vitals:   03/12/24 1937 03/13/24 0220 03/13/24 0443 03/13/24 0803  BP: 126/63 114/64 (!) 114/54   Pulse: 72 76 67   Resp: 18 20 20    Temp: 98.9 F (37.2 C)  (!) 97.4 F (36.3 C)   TempSrc: Oral  Oral   SpO2: 100% 98% (!) 88% 92%  Weight:      Height:        Intake/Output from previous day:  Intake/Output Summary (Last 24 hours) at 03/13/2024 0827 Last data filed at 03/12/2024 2040 Gross per 24 hour  Intake 491.48 ml  Output 200 ml  Net 291.48 ml    Physical Exam:  Pale female Edentulous Prior left CEA No murmur Lungs clear Abdomen benign Plus one bilateral  edema with stasis  Decreased peripheral pulses 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   Lab Results: Basic Metabolic Panel: Recent Labs    03/12/24 0454 03/13/24 0459  NA 133* 129*  K 4.8 3.8  CL 105 101  CO2 17* 21*  GLUCOSE 218* 194*  BUN 47* 50*  CREATININE 1.76* 1.65*  CALCIUM  7.9* 7.8*  MG 2.0 2.2    CBC: Recent Labs    03/12/24 1827 03/13/24 0459  HGB 9.0* 7.9*  HCT 27.9* 24.0*     Imaging: ECHOCARDIOGRAM COMPLETE Result Date: 03/11/2024    ECHOCARDIOGRAM REPORT   Patient Name:   [name] Date of Exam: 03/11/2024 Medical Rec #:  [id number]     Height:       63.0 in Accession #:    [id number]    Weight:       215.6 lb Date of Birth:  Sep 14, 1961    BSA:          1.996 m Patient Age:    62 years      BP:           131/65 mmHg Patient Gender: F             HR:           86 bpm. Exam Location:  Inpatient Procedure: 2D Echo, Cardiac Doppler, Color Doppler and Intracardiac            Opacification Agent (Both Spectral and Color Flow Doppler were            utilized during procedure). Indications:    R07.9* Chest pain, unspecified  History:        Patient has prior history of Echocardiogram examinations, most                 recent 09/27/2023. CHF, CAD, Abnormal ECG, COPD,                  Arrythmias:Atrial Fibrillation, Signs/Symptoms:Dyspnea and                 Shortness of Breath; Risk Factors:Diabetes, Dyslipidemia,                 Current Smoker and Sleep Apnea. ETOH. Substance abuse.  Sonographer:    Raynelle Callow RDCS Referring Phys: [id number] Awilda Bogus WEAVER  Sonographer Comments: Technically difficult study due to poor echo windows, patient is obese and suboptimal parasternal window. Image acquisition challenging due to patient body habitus. IMPRESSIONS  1. Left ventricular ejection fraction, by estimation, is 35 to 40%. The left ventricle has moderately decreased function. The left ventricle demonstrates global  hypokinesis. The left ventricular internal cavity size was upper limit of normal. Left ventricular diastolic parameters are consistent with Grade I diastolic dysfunction (impaired relaxation).  2. Right ventricular systolic function is mildly reduced. The right ventricular size is normal. There is moderately elevated pulmonary artery systolic pressure. The estimated right ventricular systolic pressure is 55.9 mmHg.  3. The mitral valve is degenerative. Moderate mitral valve regurgitation. No evidence of mitral stenosis.  4. The aortic valve was not well visualized. Aortic valve regurgitation is not visualized. No aortic stenosis is present.  5. The inferior vena cava is dilated in size with >50% respiratory variability, suggesting right atrial pressure of 8 mmHg. Comparison(s): A prior study was performed on 09/27/2023. Reduction in LVEF from 45-50% to 35-40% and RWMAs. FINDINGS  Left Ventricle: Left ventricular ejection fraction, by estimation, is 35 to 40%. The left ventricle has moderately decreased function. The left ventricle demonstrates global hypokinesis. Definity  contrast agent was given IV to delineate the left ventricular endocardial borders. The left ventricular internal cavity size was upper limit of normal. There is no left ventricular hypertrophy. Left ventricular  diastolic parameters are consistent with Grade I diastolic dysfunction (impaired relaxation).  LV Wall Scoring: The mid inferoseptal segment, apical septal segment, apical anterior segment, apical inferior segment, and apex are hypokinetic. Right Ventricle: The right ventricular size is normal. No increase in right ventricular wall thickness. Right ventricular systolic function is mildly reduced. There is moderately elevated pulmonary artery systolic pressure. The tricuspid regurgitant velocity is 3.46 m/s, and with an assumed right atrial pressure of 8 mmHg, the estimated right ventricular systolic pressure is 55.9 mmHg. Left Atrium: Left atrial size was normal in size. Right Atrium: Right atrial size was normal in size. Pericardium: There is no evidence of pericardial effusion. Mitral Valve: The mitral valve is degenerative in appearance. Mild mitral annular calcification. Moderate mitral valve regurgitation, with centrally-directed jet. No evidence of mitral valve stenosis. Tricuspid Valve: The tricuspid valve is grossly normal. Tricuspid valve regurgitation is mild . No evidence of tricuspid stenosis. Aortic Valve: The aortic valve was not well visualized. Aortic valve regurgitation is not visualized. No aortic stenosis is present. Pulmonic Valve: The pulmonic valve was not well visualized. Pulmonic valve regurgitation is not visualized. No evidence of pulmonic stenosis. Aorta: The aortic root and ascending aorta are structurally normal, with no evidence of dilitation. Venous: The inferior vena cava is dilated in size with greater than 50% respiratory variability, suggesting right atrial pressure of 8 mmHg. IAS/Shunts: There is redundancy of the interatrial septum. The interatrial septum was not well visualized.  LEFT VENTRICLE PLAX 2D LVIDd:         5.80 cm      Diastology LVIDs:         5.30 cm      LV e' medial:    7.18 cm/s LV PW:         0.90 cm      LV E/e' medial:  14.1 LV IVS:        1.10 cm      LV e'  lateral:   10.60 cm/s LVOT diam:     2.30 cm      LV E/e' lateral: 9.5 LV SV:         64 LV SV Index:   32 LVOT Area:     4.15 cm  LV Volumes (MOD) LV vol d, MOD A2C: 187.0 ml LV vol d, MOD A4C: 155.0 ml LV vol s, MOD A2C: 122.0  ml LV vol s, MOD A4C: 106.0 ml LV SV MOD A2C:     65.0 ml LV SV MOD A4C:     155.0 ml LV SV MOD BP:      52.7 ml RIGHT VENTRICLE            IVC RV S prime:     9.57 cm/s  IVC diam: 2.20 cm TAPSE (M-mode): 1.3 cm LEFT ATRIUM             Index        RIGHT ATRIUM           Index LA diam:        3.20 cm 1.60 cm/m   RA Area:     12.30 cm LA Vol (A2C):   53.8 ml 26.95 ml/m  RA Volume:   24.90 ml  12.47 ml/m LA Vol (A4C):   35.8 ml 17.93 ml/m LA Biplane Vol: 45.1 ml 22.59 ml/m  AORTIC VALVE LVOT Vmax:   80.70 cm/s LVOT Vmean:  55.400 cm/s LVOT VTI:    0.153 m  AORTA Ao Root diam: 3.30 cm Ao Asc diam:  3.40 cm MITRAL VALVE                  TRICUSPID VALVE MV Area (PHT): 4.39 cm       TR Peak grad:   47.9 mmHg MV Decel Time: 173 msec       TR Vmax:        346.00 cm/s MR Peak grad:    119.7 mmHg MR Mean grad:    77.0 mmHg    SHUNTS MR Vmax:         547.00 cm/s  Systemic VTI:  0.15 m MR Vmean:        413.0 cm/s   Systemic Diam: 2.30 cm MR PISA:         1.57 cm MR PISA Eff ROA: 11 mm MR PISA Radius:  0.50 cm MV E velocity: 101.00 cm/s MV A velocity: 83.00 cm/s MV E/A ratio:  1.22 Sunit Tolia Electronically signed by Olinda Bertrand Signature Date/Time: 03/11/2024/1:31:54 PM    Final     Cardiac Studies:  ECG: afib rate 99 nonspecific ST changes LVH likely    Telemetry:  NSR this am  Echo: EF 45-50% mild LAE mild MR 09/27/23  EF 35-40% by TTE 03/11/24   Medications:    allopurinol   150 mg Oral Daily   buPROPion   300 mg Oral q morning   docusate sodium   200 mg Oral QHS   gabapentin   200 mg Oral TID   insulin  aspart  0-5 Units Subcutaneous QHS   insulin  aspart  0-9 Units Subcutaneous TID WC   levothyroxine   50 mcg Oral Q0600   metoprolol  succinate  25 mg Oral Daily    mometasone -formoterol   2 puff Inhalation BID   pantoprazole  (PROTONIX ) IV  40 mg Intravenous Q12H   polyethylene glycol  17 g Oral Daily   rosuvastatin   40 mg Oral Daily   sodium chloride  flush  3 mL Intravenous Q12H   sodium chloride  flush  3 mL Intravenous Q12H      amiodarone  30 mg/hr (03/13/24 0123)    Assessment/Plan:  63 y.o. with history of CHF, PAF, CAD PVD with right transmet amputation and left fempop fifth toe amputation, left fem pop left CEA HTN, HLD DM poorly controlled and prior ETOH/cocaine abuse Admitted with GI bleed and dyspnea with chest pain troponin 106 and BNP 666  Troponin elevation:  No acute ECG changes history of small OM dx Rx medically no indication for cath continue lopressor  Not a cath candidate with active GI bleed  CHF:  lasix  with transfusions TTE with slightly lower EF 35-40% BP soft with GI bleed hydralazine  on hold when stable likely start Bidil Anemia Hct 24 this am seen by Dr Nickey Barn GI for EGD today  PAF:  eliquis  on hold in NSR this am continue beta blocker iv amiodarone  PVD: with significant LA dx post left fem pop right PCI and left CEA   Janelle Mediate 03/13/2024, 8:27 AM

## 2024-03-13 NOTE — Op Note (Signed)
 [facility] Patient Name: [name] Procedure Date : 03/13/2024 MRN: [id number] Attending MD: Nannette Babe , MD, [id number] Date of Birth: May 20, 1961 CSN: [id number] Age: 63 Admit Type: Inpatient Procedure:                Upper GI endoscopy Indications:              Heme positive stool, Melena; 3 unit of pRBCs thus                            far Providers:                Amber Bail. Bridgett Camps, MD, Bradley Caffey, Gabino Joe, Technician Referring MD:             [facility] Medicines:                Monitored Anesthesia Care Complications:            No immediate complications. Estimated Blood Loss:     Estimated blood loss: none. Procedure:                Pre-Anesthesia Assessment:                           - Prior to the procedure, a History and Physical                            was performed, and patient medications and                            allergies were reviewed. The patient's tolerance of                            previous anesthesia was also reviewed. The risks                            and benefits of the procedure and the sedation                            options and risks were discussed with the patient.                            All questions were answered, and informed consent                            was obtained. Prior Anticoagulants: The patient has                            taken Eliquis  (apixaban ), last dose was 5 days                            prior to procedure. ASA Grade Assessment: III - A  patient with severe systemic disease. After                            reviewing the risks and benefits, the patient was                            deemed in satisfactory condition to undergo the                            procedure.                           After obtaining informed consent, the endoscope was                            passed under direct vision. Throughout  the                            procedure, the patient's blood pressure, pulse, and                            oxygen  saturations were monitored continuously. The                            [id number]-H190 ([id number]) Olympus endoscope was introduced                            through the mouth, and advanced to the second part                            of duodenum. The upper GI endoscopy was                            accomplished without difficulty. The patient                            tolerated the procedure well. Scope In: Scope Out: Findings:      Patchy, white plaques were found in the lower third of the esophagus.       Otherwise normal esophagus.      The stomach was normal.      The examined duodenum was normal. Impression:               - Esophageal plaques were found, consistent with                            candidiasis.                           - Normal stomach.                           - Normal examined duodenum.                           - No specimens collected. Moderate Sedation:      N/A  Recommendation:           - Return patient to hospital ward for ongoing care.                           - Clear liquid diet.                           - Continue present medications.                           - Fluconazole  200 mg x 1 day, 100 mg x 13 days for                            Candida esophagitis (pt did report dysphagia                            symptom on initial consult and treatment will                            likely help this symptom)                           - Colonoscopy is recommended given GI blood loss                            with no source found on upper endoscopy today.                           - Continue to hold anticoagulation. Procedure Code(s):        --- Professional ---                           [phone], Esophagogastroduodenoscopy, flexible,                            transoral; diagnostic, including collection of                             specimen(s) by brushing or washing, when performed                            (separate procedure) Diagnosis Code(s):        --- Professional ---                           K22.9, Disease of esophagus, unspecified                           R19.5, Other fecal abnormalities                           K92.1, Melena (includes Hematochezia) CPT copyright 2022 American Medical Association. All rights reserved. The codes documented in this report are preliminary and upon coder review may  be revised to meet current compliance requirements. Nannette Babe, MD 03/13/2024 10:17:25 AM This report has been signed electronically.  Number of Addenda: 0

## 2024-03-13 NOTE — Inpatient Diabetes Management (Signed)
 Inpatient Diabetes Program Recommendations  AACE/ADA: New Consensus Statement on Inpatient Glycemic Control (2015)  Target Ranges:  Prepandial:   less than 140 mg/dL      Peak postprandial:   less than 180 mg/dL (1-2 hours)      Critically ill patients:  140 - 180 mg/dL   Lab Results  Component Value Date   GLUCAP 209 (H) 03/13/2024   HGBA1C 12.6 (H) 01/23/2024    Review of Glycemic Control  Latest Reference Range & Units 03/12/24 07:22 03/12/24 11:26 03/12/24 16:17 03/12/24 19:41 03/13/24 07:46  Glucose-Capillary 70 - 99 mg/dL 284 (H) 132 (H) 440 (H) 334 (H) 209 (H)   Diabetes history: DM 2 Outpatient Diabetes medications: Lantus  56 units, Humalog  20 units tid before meals, Mounjaro 7.5 mg weekly (has one dose) Current orders for Inpatient glycemic control:  Novolog  0-9 units tid + hs  Note: Glucose trends increase after PO intake  A1c 12.6% on 3/11 at Endocrinology appointment has had several adjustments since that time. Pt in close contact with Dr. Vertell Gory with [facility].   -   Add Novolog  3 units tid meal coverage if eating >50% of meals  Thanks,  Eloise Hake RN, MSN, BC-ADM Inpatient Diabetes Coordinator Team Pager [phone] (8a-5p)

## 2024-03-13 NOTE — Plan of Care (Signed)
   Problem: Education: Goal: Knowledge of General Education information will improve Description: Including pain rating scale, medication(s)/side effects and non-pharmacologic comfort measures Outcome: Progressing   Problem: Health Behavior/Discharge Planning: Goal: Ability to manage health-related needs will improve Outcome: Progressing   Problem: Clinical Measurements: Goal: Ability to maintain clinical measurements within normal limits will improve Outcome: Progressing Goal: Will remain free from infection Outcome: Progressing Goal: Diagnostic test results will improve Outcome: Progressing Goal: Respiratory complications will improve Outcome: Progressing Goal: Cardiovascular complication will be avoided Outcome: Progressing   Problem: Activity: Goal: Risk for activity intolerance will decrease Outcome: Progressing   Problem: Nutrition: Goal: Adequate nutrition will be maintained Outcome: Progressing   Problem: Coping: Goal: Level of anxiety will decrease Outcome: Progressing   Problem: Elimination: Goal: Will not experience complications related to bowel motility Outcome: Progressing Goal: Will not experience complications related to urinary retention Outcome: Progressing   Problem: Pain Managment: Goal: General experience of comfort will improve and/or be controlled Outcome: Progressing   Problem: Safety: Goal: Ability to remain free from injury will improve Outcome: Progressing   Problem: Skin Integrity: Goal: Risk for impaired skin integrity will decrease Outcome: Progressing   Problem: Education: Goal: Ability to describe self-care measures that may prevent or decrease complications (Diabetes Survival Skills Education) will improve Outcome: Progressing Goal: Individualized Educational Video(s) Outcome: Progressing   Problem: Coping: Goal: Ability to adjust to condition or change in health will improve Outcome: Progressing   Problem: Fluid  Volume: Goal: Ability to maintain a balanced intake and output will improve Outcome: Progressing   Problem: Health Behavior/Discharge Planning: Goal: Ability to identify and utilize available resources and services will improve Outcome: Progressing Goal: Ability to manage health-related needs will improve Outcome: Progressing   Problem: Metabolic: Goal: Ability to maintain appropriate glucose levels will improve Outcome: Progressing   Problem: Nutritional: Goal: Maintenance of adequate nutrition will improve Outcome: Progressing Goal: Progress toward achieving an optimal weight will improve Outcome: Progressing   Problem: Skin Integrity: Goal: Risk for impaired skin integrity will decrease Outcome: Progressing   Problem: Tissue Perfusion: Goal: Adequacy of tissue perfusion will improve Outcome: Progressing   Problem: Education: Goal: Ability to describe self-care measures that may prevent or decrease complications (Diabetes Survival Skills Education) will improve Outcome: Progressing Goal: Individualized Educational Video(s) Outcome: Progressing   Problem: Coping: Goal: Ability to adjust to condition or change in health will improve Outcome: Progressing   Problem: Fluid Volume: Goal: Ability to maintain a balanced intake and output will improve Outcome: Progressing   Problem: Health Behavior/Discharge Planning: Goal: Ability to identify and utilize available resources and services will improve Outcome: Progressing Goal: Ability to manage health-related needs will improve Outcome: Progressing   Problem: Metabolic: Goal: Ability to maintain appropriate glucose levels will improve Outcome: Progressing   Problem: Nutritional: Goal: Maintenance of adequate nutrition will improve Outcome: Progressing Goal: Progress toward achieving an optimal weight will improve Outcome: Progressing   Problem: Skin Integrity: Goal: Risk for impaired skin integrity will  decrease Outcome: Progressing   Problem: Tissue Perfusion: Goal: Adequacy of tissue perfusion will improve Outcome: Progressing

## 2024-03-14 ENCOUNTER — Telehealth (HOSPITAL_COMMUNITY): Payer: Self-pay

## 2024-03-14 ENCOUNTER — Other Ambulatory Visit (HOSPITAL_COMMUNITY)

## 2024-03-14 ENCOUNTER — Other Ambulatory Visit (HOSPITAL_COMMUNITY): Payer: Self-pay

## 2024-03-14 ENCOUNTER — Encounter (HOSPITAL_COMMUNITY): Payer: Self-pay | Admitting: Internal Medicine

## 2024-03-14 ENCOUNTER — Inpatient Hospital Stay (HOSPITAL_COMMUNITY): Admitting: Anesthesiology

## 2024-03-14 ENCOUNTER — Encounter (HOSPITAL_COMMUNITY)
Admission: EM | Disposition: A | Discharge status: Home / Self Care | Payer: Self-pay | Source: Home / Self Care | Attending: Internal Medicine

## 2024-03-14 DIAGNOSIS — D123 Benign neoplasm of transverse colon: Secondary | ICD-10-CM | POA: Diagnosis not present

## 2024-03-14 DIAGNOSIS — D125 Benign neoplasm of sigmoid colon: Secondary | ICD-10-CM

## 2024-03-14 DIAGNOSIS — D124 Benign neoplasm of descending colon: Secondary | ICD-10-CM | POA: Diagnosis not present

## 2024-03-14 DIAGNOSIS — I480 Paroxysmal atrial fibrillation: Secondary | ICD-10-CM | POA: Diagnosis not present

## 2024-03-14 DIAGNOSIS — R079 Chest pain, unspecified: Secondary | ICD-10-CM | POA: Diagnosis not present

## 2024-03-14 DIAGNOSIS — I5043 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) and diastolic (congestive) heart failure: Secondary | ICD-10-CM

## 2024-03-14 DIAGNOSIS — K920 Hematemesis: Secondary | ICD-10-CM

## 2024-03-14 DIAGNOSIS — D122 Benign neoplasm of ascending colon: Secondary | ICD-10-CM

## 2024-03-14 DIAGNOSIS — D649 Anemia, unspecified: Secondary | ICD-10-CM | POA: Diagnosis not present

## 2024-03-14 HISTORY — PX: COLONOSCOPY: SHX5424

## 2024-03-14 LAB — BASIC METABOLIC PANEL WITH GFR
Anion gap: 12 (ref 5–15)
BUN: 41 mg/dL — ABNORMAL HIGH (ref 8–23)
CO2: 23 mmol/L (ref 22–32)
Calcium: 8.5 mg/dL — ABNORMAL LOW (ref 8.9–10.3)
Chloride: 103 mmol/L (ref 98–111)
Creatinine, Ser: 1.54 mg/dL — ABNORMAL HIGH (ref 0.44–1.00)
GFR, Estimated: 38 mL/min — ABNORMAL LOW (ref >60)
Glucose, Bld: 182 mg/dL — ABNORMAL HIGH (ref 70–99)
Potassium: 3.6 mmol/L (ref 3.5–5.1)
Sodium: 138 mmol/L (ref 135–145)

## 2024-03-14 LAB — GLUCOSE, CAPILLARY
Glucose-Capillary: 204 mg/dL — ABNORMAL HIGH (ref 70–99)
Glucose-Capillary: 213 mg/dL — ABNORMAL HIGH (ref 70–99)
Glucose-Capillary: 346 mg/dL — ABNORMAL HIGH (ref 70–99)
Glucose-Capillary: 188 mg/dL — ABNORMAL HIGH (ref 70–99)

## 2024-03-14 LAB — MAGNESIUM: Magnesium: 2.3 mg/dL (ref 1.7–2.4)

## 2024-03-14 LAB — HEMOGLOBIN AND HEMATOCRIT, BLOOD
HCT: 24.5 % — ABNORMAL LOW (ref 36.0–46.0)
Hemoglobin: 8.2 g/dL — ABNORMAL LOW (ref 12.0–15.0)

## 2024-03-14 SURGERY — COLONOSCOPY
Anesthesia: Monitor Anesthesia Care

## 2024-03-14 MED ORDER — PROPOFOL 500 MG/50ML IV EMUL
INTRAVENOUS | Status: DC | PRN
  Administered 2024-03-14: 100 ug/kg/min via INTRAVENOUS

## 2024-03-14 MED ORDER — INSULIN GLARGINE-YFGN 100 UNIT/ML ~~LOC~~ SOLN
20.0000 [IU] | Freq: Every day | SUBCUTANEOUS | Status: DC
  Administered 2024-03-14 – 2024-03-17 (×4): 20 [IU] via SUBCUTANEOUS
  Filled 2024-03-14 (×4): qty 0.2

## 2024-03-14 MED ORDER — SODIUM CHLORIDE 0.9 % IV SOLN: INTRAVENOUS | Status: DC | PRN

## 2024-03-14 MED ORDER — PANTOPRAZOLE SODIUM 40 MG PO TBEC
40.0000 mg | DELAYED_RELEASE_TABLET | Freq: Every day | ORAL | Status: DC
  Administered 2024-03-15 – 2024-03-17 (×3): 40 mg via ORAL
  Filled 2024-03-14 (×3): qty 1

## 2024-03-14 NOTE — Progress Notes (Signed)
 Mobility Specialist Progress Note;   03/14/24 1500  Mobility  Activity Ambulated with assistance in hallway  Level of Assistance Contact guard assist, steadying assist  Assistive Device Four wheel walker  Distance Ambulated (ft) 50 ft  Activity Response Tolerated well  Mobility Referral Yes  Mobility visit 1 Mobility  Mobility Specialist Start Time (ACUTE ONLY) 1500  Mobility Specialist Stop Time (ACUTE ONLY) 1515  Mobility Specialist Time Calculation (min) (ACUTE ONLY) 15 min   Pt agreeable to mobility. Required MinA to stand from chair and MinG assistance during ambulation. Took 2x seated rest breaks d/t fatigue. Heavy education on rollator use as pt needed many reminders. VSS throughout. C/o R knee pain from arthritis, otherwise asx. Pt returned back to chair with all needs met, call bell in reach.   Janit Meline Mobility Specialist Please contact via SecureChat or [employer] [phone]

## 2024-03-14 NOTE — Progress Notes (Signed)
 Cardiologist:  Camnits/McLean  Subjective:   Seen before going to endo for EGD/Colon Rhythm stable   Objective:  Vitals:   03/14/24 0537 03/14/24 0727 03/14/24 0753 03/14/24 0819  BP: (!) 148/62 (!) 140/62  135/68  Pulse: 72 67    Resp: 18 18  12   Temp: 98.1 F (36.7 C) 98.2 F (36.8 C)  (!) 97.5 F (36.4 C)  TempSrc: Oral Oral  Temporal  SpO2: 100%  100% 100%  Weight:      Height:        Intake/Output from previous day:  Intake/Output Summary (Last 24 hours) at 03/14/2024 0920 Last data filed at 03/14/2024 0114 Gross per 24 hour  Intake 1521.18 ml  Output --  Net 1521.18 ml    Physical Exam:  Pale female Edentulous Prior left CEA No murmur Lungs clear Abdomen benign Plus one bilateral  edema with stasis  Decreased peripheral pulses 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   Cardiac Studies:  ECG: afib rate 99 nonspecific ST changes LVH likely    Telemetry:  NSR this am  Echo: EF 45-50% mild LAE mild MR 09/27/23  EF 35-40% by TTE 03/11/24   Medications:    [MAR Hold] allopurinol   150 mg Oral Daily   [MAR Hold] buPROPion   300 mg Oral q morning   [MAR Hold] docusate sodium   200 mg Oral QHS   [MAR Hold] fluconazole   100 mg Oral Daily   [MAR Hold] gabapentin   200 mg Oral TID   [MAR Hold] insulin  aspart  0-5 Units Subcutaneous QHS   [MAR Hold] insulin  aspart  0-9 Units Subcutaneous TID WC   [MAR Hold] levothyroxine   50 mcg Oral Q0600   [MAR Hold] metoprolol  succinate  25 mg Oral Daily   [MAR Hold] mometasone -formoterol   2 puff Inhalation BID   [MAR Hold] pantoprazole  (PROTONIX )  IV  40 mg Intravenous Q12H   [MAR Hold] polyethylene glycol  17 g Oral Daily   [MAR Hold] rosuvastatin   40 mg Oral Daily   [MAR Hold] sodium chloride  flush  3 mL Intravenous Q12H   [MAR Hold] sodium chloride  flush  3 mL Intravenous Q12H      amiodarone  30 mg/hr (03/14/24 0331)    Assessment/Plan:  63 y.o. with history of CHF, PAF, CAD PVD with right transmet amputation and left fempop fifth toe amputation, left fem pop left CEA HTN, HLD DM poorly controlled and prior ETOH/cocaine abuse Admitted with GI bleed and dyspnea with chest pain troponin 106 and BNP 666  Troponin elevation:  No acute ECG changes history of small OM dx Rx medically no indication for cath continue lopressor  Not a cath candidate with active GI bleed  CHF:  lasix  with transfusions TTE with slightly lower EF 35-40% BP soft with GI bleed hydralazine  on hold when stable likely start Bidil Anemia Hct 24.5 this am seen by Dr Nickey Barn GI for EGD/colon today  PAF:  eliquis  on hold in NSR this am continue beta blocker iv  amiodarone  PVD: with significant LA dx post left fem pop right PCI and left CEA   Can transition to oral amiodarone  in am post procedures   Janelle Mediate 03/14/2024, 9:20 AM

## 2024-03-14 NOTE — Care Management Important Message (Signed)
 Important Message  Patient Details  Name: [name] MRN: [id number] Date of Birth: [birth year]   Important Message Given:  Yes - Medicare IM     Janith Melnick 03/14/2024, 11:25 AM

## 2024-03-14 NOTE — Plan of Care (Signed)
   Problem: Education: Goal: Knowledge of General Education information will improve Description: Including pain rating scale, medication(s)/side effects and non-pharmacologic comfort measures Outcome: Progressing   Problem: Health Behavior/Discharge Planning: Goal: Ability to manage health-related needs will improve Outcome: Progressing   Problem: Clinical Measurements: Goal: Ability to maintain clinical measurements within normal limits will improve Outcome: Progressing Goal: Will remain free from infection Outcome: Progressing Goal: Diagnostic test results will improve Outcome: Progressing Goal: Respiratory complications will improve Outcome: Progressing Goal: Cardiovascular complication will be avoided Outcome: Progressing   Problem: Activity: Goal: Risk for activity intolerance will decrease Outcome: Progressing   Problem: Nutrition: Goal: Adequate nutrition will be maintained Outcome: Progressing   Problem: Coping: Goal: Level of anxiety will decrease Outcome: Progressing   Problem: Elimination: Goal: Will not experience complications related to bowel motility Outcome: Progressing Goal: Will not experience complications related to urinary retention Outcome: Progressing   Problem: Pain Managment: Goal: General experience of comfort will improve and/or be controlled Outcome: Progressing   Problem: Safety: Goal: Ability to remain free from injury will improve Outcome: Progressing   Problem: Skin Integrity: Goal: Risk for impaired skin integrity will decrease Outcome: Progressing   Problem: Education: Goal: Ability to describe self-care measures that may prevent or decrease complications (Diabetes Survival Skills Education) will improve Outcome: Progressing Goal: Individualized Educational Video(s) Outcome: Progressing   Problem: Coping: Goal: Ability to adjust to condition or change in health will improve Outcome: Progressing   Problem: Fluid  Volume: Goal: Ability to maintain a balanced intake and output will improve Outcome: Progressing   Problem: Health Behavior/Discharge Planning: Goal: Ability to identify and utilize available resources and services will improve Outcome: Progressing Goal: Ability to manage health-related needs will improve Outcome: Progressing   Problem: Metabolic: Goal: Ability to maintain appropriate glucose levels will improve Outcome: Progressing   Problem: Nutritional: Goal: Maintenance of adequate nutrition will improve Outcome: Progressing Goal: Progress toward achieving an optimal weight will improve Outcome: Progressing   Problem: Skin Integrity: Goal: Risk for impaired skin integrity will decrease Outcome: Progressing   Problem: Tissue Perfusion: Goal: Adequacy of tissue perfusion will improve Outcome: Progressing   Problem: Education: Goal: Ability to describe self-care measures that may prevent or decrease complications (Diabetes Survival Skills Education) will improve Outcome: Progressing Goal: Individualized Educational Video(s) Outcome: Progressing   Problem: Coping: Goal: Ability to adjust to condition or change in health will improve Outcome: Progressing   Problem: Fluid Volume: Goal: Ability to maintain a balanced intake and output will improve Outcome: Progressing   Problem: Health Behavior/Discharge Planning: Goal: Ability to identify and utilize available resources and services will improve Outcome: Progressing Goal: Ability to manage health-related needs will improve Outcome: Progressing   Problem: Metabolic: Goal: Ability to maintain appropriate glucose levels will improve Outcome: Progressing   Problem: Nutritional: Goal: Maintenance of adequate nutrition will improve Outcome: Progressing Goal: Progress toward achieving an optimal weight will improve Outcome: Progressing   Problem: Skin Integrity: Goal: Risk for impaired skin integrity will  decrease Outcome: Progressing   Problem: Tissue Perfusion: Goal: Adequacy of tissue perfusion will improve Outcome: Progressing

## 2024-03-14 NOTE — Telephone Encounter (Signed)
 Pharmacy Patient Advocate Encounter  Insurance verification completed.    The patient is insured through  [employer] . Patient has Medicare and is not eligible for a copay card, but may be able to apply for patient assistance or Medicare RX Payment Plan (Patient Must reach out to their plan, if eligible for payment plan), if available.    Ran test claim for BIDIL and the current 30 day co-pay is $0.00.   This test claim was processed through [facility]- copay amounts may vary at other pharmacies due to pharmacy/plan contracts, or as the patient moves through the different stages of their insurance plan.

## 2024-03-14 NOTE — Op Note (Signed)
 [facility] Patient Name: [name] Procedure Date : 03/14/2024 MRN: [id number] Attending MD: Nannette Babe , MD, [id number] Date of Birth: 21-Oct-1961 CSN: [id number] Age: 63 Admit Type: Inpatient Procedure:                Colonoscopy Indications:              Melena, Acute on chronic blood loss anemia, normal                            EGD yesterday Providers:                Amber Bail. Bridgett Camps, MD, Golda Latch, RN, Tyrus Gallus, Technician Referring MD:             [facility] Medicines:                Monitored Anesthesia Care Complications:            No immediate complications. Estimated Blood Loss:     Estimated blood loss was minimal. Procedure:                Pre-Anesthesia Assessment:                           - Prior to the procedure, a History and Physical                            was performed, and patient medications and                            allergies were reviewed. The patient's tolerance of                            previous anesthesia was also reviewed. The risks                            and benefits of the procedure and the sedation                            options and risks were discussed with the patient.                            All questions were answered, and informed consent                            was obtained. Prior Anticoagulants: The patient has                            taken Eliquis  (apixaban ), last dose was 4 days                            prior to procedure. ASA Grade Assessment: III - A  patient with severe systemic disease. After                            reviewing the risks and benefits, the patient was                            deemed in satisfactory condition to undergo the                            procedure.                           After obtaining informed consent, the colonoscope                            was passed under direct vision.  Throughout the                            procedure, the patient's blood pressure, pulse, and                            oxygen  saturations were monitored continuously. The                            [id number]-HQ190L ([id number]) Olympus coloscope was                            introduced through the anus and advanced to the                            cecum, identified by appendiceal orifice and                            ileocecal valve. The colonoscopy was performed                            without difficulty. The patient tolerated the                            procedure well. The quality of the bowel                            preparation was fair requiring copious irrigation                            and lavage. The ileocecal valve, appendiceal                            orifice, and rectum were photographed. Scope In: 9:34:26 AM Scope Out: 10:23:20 AM Scope Withdrawal Time: 0 hours 38 minutes 4 seconds  Total Procedure Duration: 0 hours 48 minutes 54 seconds  Findings:      The digital rectal exam was normal.      Two sessile polyps were found in the ascending colon. The polyps were 4       to  6 mm in size. These polyps were removed with a cold snare. Resection       and retrieval were complete.      Three sessile polyps were found in the transverse colon. The polyps were       4 to 5 mm in size. These polyps were removed with a cold snare.       Resection and retrieval were complete.      Two sessile polyps were found in the descending colon. The polyps were 5       to 6 mm in size. These polyps were removed with a cold snare. Resection       and retrieval were complete.      A 20 mm polyp was found in the sigmoid colon. The polyp was       pedunculated. The polyp was removed with a hot snare. Resection and       retrieval were complete. To prevent bleeding after the polypectomy, one       hemostatic clip was successfully placed (MR conditional). Clip       manufacturer: [employer]. There  was no bleeding at the end of the procedure.      The retroflexed view of the distal rectum and anal verge was normal and       showed no anal or rectal abnormalities. Impression:               - Preparation of the colon was fair. Copious                            irrigation and lavage was performed throughout.                           - Two 4 to 6 mm polyps in the ascending colon,                            removed with a cold snare. Resected and retrieved.                           - Three 4 to 5 mm polyps in the transverse colon,                            removed with a cold snare. Resected and retrieved.                           - Two 5 to 6 mm polyps in the descending colon,                            removed with a cold snare. Resected and retrieved.                           - One 20 mm polyp in the sigmoid colon, removed                            with a hot snare. The polyp was friable with  contract. Resected and retrieved. Clip (MR                            conditional) was placed. Clip manufacturer: [employer].                           - The distal rectum and anal verge are normal on                            retroflexion view. Moderate Sedation:      N/A Recommendation:           - Return patient to hospital ward for ongoing care.                           - Advance diet as tolerated.                           - Continue present medications.                           - Can resume DOAC in 48 hours with close                            observation for post-polypectomy bleeding and                            recurrent melena. If rebleeding or drifting Hgb as                            an outpatient video capsule endoscopy is the next                            step and would be recommended at that time.                           - Await pathology results.                           - Repeat colonoscopy date to be determined after                             pending pathology results are reviewed for                            surveillance.                           - GI will remain available, call if questions. Procedure Code(s):        --- Professional ---                           [phone], Colonoscopy, flexible; with removal of                            tumor(s), polyp(s), or other  lesion(s) by snare                            technique Diagnosis Code(s):        --- Professional ---                           D12.2, Benign neoplasm of ascending colon                           D12.3, Benign neoplasm of transverse colon (hepatic                            flexure or splenic flexure)                           D12.4, Benign neoplasm of descending colon                           D12.5, Benign neoplasm of sigmoid colon                           K92.1, Melena (includes Hematochezia)                           D62, Acute posthemorrhagic anemia CPT copyright 2022 American Medical Association. All rights reserved. The codes documented in this report are preliminary and upon coder review may  be revised to meet current compliance requirements. Nannette Babe, MD 03/14/2024 10:37:46 AM This report has been signed electronically. Number of Addenda: 0

## 2024-03-14 NOTE — Inpatient Diabetes Management (Signed)
 Inpatient Diabetes Program Recommendations  AACE/ADA: New Consensus Statement on Inpatient Glycemic Control (2015)  Target Ranges:  Prepandial:   less than 140 mg/dL      Peak postprandial:   less than 180 mg/dL (1-2 hours)      Critically ill patients:  140 - 180 mg/dL   Lab Results  Component Value Date   GLUCAP 204 (H) 03/14/2024   HGBA1C 12.6 (H) 01/23/2024    Review of Glycemic Control  Latest Reference Range & Units 03/12/24 07:22 03/12/24 11:26 03/12/24 16:17 03/12/24 19:41 03/13/24 07:46  Glucose-Capillary 70 - 99 mg/dL 295 (H) 621 (H) 308 (H) 334 (H) 209 (H)    Latest Reference Range & Units 03/13/24 07:46 03/13/24 11:46 03/13/24 16:56 03/13/24 19:45 03/14/24 07:31  Glucose-Capillary 70 - 99 mg/dL 657 (H) 846 (H) 962 (H) 296 (H) 204 (H)   Diabetes history: DM 2 Outpatient Diabetes medications: Lantus  56 units, Humalog  20 units tid before meals, Mounjaro 7.5 mg weekly (has one dose) Current orders for Inpatient glycemic control:  Novolog  0-9 units tid + hs  Note: Glucose trends increase after PO intake  A1c 12.6% on 3/11 at Endocrinology appointment has had several adjustments since that time. Pt in close contact with Dr. Vertell Gory with [facility].   -   Add Novolog  3 units tid meal coverage if eating >50% of meals  Thanks,  Eloise Hake RN, MSN, BC-ADM Inpatient Diabetes Coordinator Team Pager [phone] (8a-5p)

## 2024-03-14 NOTE — Anesthesia Preprocedure Evaluation (Addendum)
 Anesthesia Evaluation  Patient identified by MRN, date of birth, ID band Patient awake    Reviewed: Allergy & Precautions, NPO status , Patient's Chart, lab work & pertinent test results, reviewed documented beta blocker date and time   History of Anesthesia Complications Negative for: history of anesthetic complications  Airway Mallampati: II  TM Distance: >3 FB Neck ROM: Full    Dental  (+) Edentulous Upper, Edentulous Lower   Pulmonary sleep apnea , COPD,  COPD inhaler, Current Smoker and Patient abstained from smoking.   Pulmonary exam normal        Cardiovascular hypertension, Pt. on medications and Pt. on home beta blockers pulmonary hypertension+ CAD, + Peripheral Vascular Disease, +CHF and + DVT  Normal cardiovascular exam+ dysrhythmias Atrial Fibrillation + Valvular Problems/Murmurs MR    '25 TTE - EF 35 to 40%. The left ventricle demonstrates global hypokinesis. The left ventricular internal cavity size was upper limit of normal. Grade I diastolic dysfunction (impaired relaxation). Right ventricular systolic function is mildly reduced. There is moderately elevated pulmonary artery systolic pressure. The estimated right ventricular systolic pressure is 55.9 mmHg. Moderate mitral valve regurgitation.     Neuro/Psych  PSYCHIATRIC DISORDERS Anxiety Depression     Neuromuscular disease    GI/Hepatic ,GERD  ,,(+) Cirrhosis     substance abuse  alcohol use, cocaine use and marijuana use  Endo/Other  diabetes, Type 2, Insulin  DependentHypothyroidism   Obesity   Renal/GU CRFRenal disease     Musculoskeletal  (+) Arthritis ,  narcotic dependent  Abdominal   Peds  Hematology  (+) Blood dyscrasia, anemia  On eliquis     Anesthesia Other Findings On GLP-1a Noncompliance   Reproductive/Obstetrics                             Anesthesia Physical Anesthesia Plan  ASA: 4  Anesthesia Plan:  MAC   Post-op Pain Management: Minimal or no pain anticipated   Induction:   PONV Risk Score and Plan: 1 and Propofol  infusion and Treatment may vary due to age or medical condition  Airway Management Planned: Natural Airway and Simple Face Mask  Additional Equipment: None  Intra-op Plan:   Post-operative Plan:   Informed Consent: I have reviewed the patients History and Physical, chart, labs and discussed the procedure including the risks, benefits and alternatives for the proposed anesthesia with the patient or authorized representative who has indicated his/her understanding and acceptance.       Plan Discussed with: CRNA and Anesthesiologist  Anesthesia Plan Comments:        Anesthesia Quick Evaluation

## 2024-03-14 NOTE — Anesthesia Postprocedure Evaluation (Signed)
 Anesthesia Post Note  Patient: [name]  Procedure(s) Performed: COLONOSCOPY     Patient location during evaluation: PACU Anesthesia Type: MAC Level of consciousness: awake and alert Pain management: pain level controlled Vital Signs Assessment: post-procedure vital signs reviewed and stable Respiratory status: spontaneous breathing, nonlabored ventilation and respiratory function stable Cardiovascular status: stable and blood pressure returned to baseline Anesthetic complications: no  No notable events documented.  Last Vitals:  Vitals:   03/14/24 1040 03/14/24 1050  BP: (!) 107/52 (!) 142/60  Pulse: 60 64  Resp: (!) 24 13  Temp:    SpO2: 96% 97%    Last Pain:  Vitals:   03/14/24 1050  TempSrc:   PainSc: 0-No pain                 Juventino Oppenheim

## 2024-03-14 NOTE — Progress Notes (Signed)
 PROGRESS NOTE    Karla DEMETRIOU  LKG:[id number] DOB: 09-19-1961 DOA: 03/08/2024 PCP: Maryellen Snare, NP   Brief Narrative: 63 year old past medical history significant for CAD, heart failure reduced ejection fraction, diastolic heart failure, CAD, CKD stage IV, carotid stenosis status post endarterectomy, PAF, PAD, COPD, ongoing tobacco use, chronic bradycardia, gout, hypertension, diabetes type 2, hyperlipidemia, vitamin D  deficiency presented with chest pain.  Received nitroglycerin  by EMS.  She also complaining of melena.  She does take BC powder approximately 2 times per month.  Was found to have a hemoglobin of 5.3 on admission, BNP 666, rectal exam performed in the ED showed melena.  GI and cardiology following.  Underwent endoscopy which was negative for source of active bleeding.  Underwent colonoscopy 5/1 multiple polyps removed.  Per GI if either drop in hemoglobin she might need capsule endoscopy.  Okay to discharge per GI.  Call resume DOAC in 48 hours with close observation     Assessment & Plan:   Principal Problem:   GI bleed Active Problems:   Paroxysmal atrial fibrillation ([name])   Demand ischemia ([name])   CKD (chronic kidney disease), stage IV ([name])   S/P peripheral artery angioplasty - TurboHawk atherectomy; R SFA   Symptomatic anemia   HFrEF (heart failure with reduced ejection fraction) ([name])   Insulin  dependent type 2 diabetes mellitus ([name])   Peripheral artery disease ([name])   Carotid artery stenosis   COPD (chronic obstructive pulmonary disease) ([name])   Hyperlipidemia   Hypothyroidism   Chronic sinus bradycardia   PAF (paroxysmal atrial fibrillation) ([name])   History of CAD (coronary artery disease)   Continuous dependence on cigarette smoking   GAD (generalized anxiety disorder)   Heme positive stool   Chronic systolic congestive heart failure ([name])   Melena   Benign neoplasm of ascending colon   Benign neoplasm of transverse colon   Benign neoplasm of  descending colon   Benign neoplasm of sigmoid colon   1-GI bleed, acute blood loss anemia Patient presented with a hemoglobin of 5.3, presented with melena She has received 2 units of packed red blood cells on 4/26. Hemoglobin down to 7.4 another unit of packed red blood cells was transfused.  Hemoglobin  stable at 8./  GI following, endoscopy was delayed until hemoglobin was stable above 7. Underwent endoscopy 4/30: Esophageal plaques were found consistent with candidiasis.  Normal stomach. Started on fluconazole .   Normal examined duodenal. -No source of bleeding on endoscopy, underwent colonoscopy, multiples polyps found and had polypectomy.  May resume DOAC in 48 hours per GI.  Also patient could be discharged home and if she has recurrence of bleeding she might require capsule endoscopy Plan to repeat hemoglobin in the morning  Demand ischemia, troponin elevation: Evaluated by cardiology not cath candidate due to active bleeding.  No indication for cath per cardiology Cardiology consulted per anesthesiology request for clearance for endoscopy procedure. Echo performed ejection fraction 35 to 40%, global hypokinesis.  Mildly decreased in ejection fraction compared to 2024.  Ischemic evaluation deferred by cardiology currently.  Chronic systolic heart failure appreciate cardiology follow-up  PAF holding Eliquis  in the setting of GI bleed.  Currently on IV amiodarone  Cardiology following Per GI okay to resume DOAC in 48 hours, lost follow-up due to recent polypectomy Plan to transition to oral amiodarone  tomorrow a.m.  PVD with significant LAD dx post left femoral pop right PCI and left CEA  Insulin  dependent type 2 diabetes mellitus ([name]) - Last A1c 12.6% on  01/23/2024 - Continue on SSI and CBG monitoring for now  -Resume long-acting insulin   Peripheral artery disease ([name]) - Eliquis  on hold - Continue Crestor    COPD (chronic obstructive pulmonary disease) ([name]) - No  signs/symptoms of exacerbation   Hyperlipidemia - Continue Crestor    Hypothyroidism - Continue Synthroid    GAD (generalized anxiety disorder) - Continue Wellbutrin    History of CAD (coronary artery disease) - Eliquis  on hold - Continue statin, Toprol      Estimated body mass index is 38.19 kg/m as calculated from the following:   Height as of this encounter: 5\' 3"  (1.6 m).   Weight as of this encounter: 97.8 kg.   DVT prophylaxis: CDs Code Status: Full code Family Communication: Disposition Plan:  Status is: Inpatient Remains inpatient appropriate because: Management of GI bleed, home when cleared by cardiology    Consultants:  GI Cardiology  Procedures:  Echo  Antimicrobials:    Subjective: She is alert, tolerating diet, denies pain.  Came from colonoscopy  Objective: Vitals:   03/14/24 1050 03/14/24 1114 03/14/24 1135 03/14/24 1651  BP: (!) 142/60 (!) 107/50 131/82 (!) 147/70  Pulse: 64  68 76  Resp: 13  18 18   Temp:   99.2 F (37.3 C) 98.2 F (36.8 C)  TempSrc:   Oral Oral  SpO2: 97% 94% 100%   Weight:      Height:        Intake/Output Summary (Last 24 hours) at 03/14/2024 1727 Last data filed at 03/14/2024 1407 Gross per 24 hour  Intake 1231.18 ml  Output --  Net 1231.18 ml   Filed Weights   03/09/24 1101  Weight: 97.8 kg    Examination:  General exam: NAD Respiratory system: CTA Cardiovascular system: S 1, S 2 RRR Gastrointestinal system: BS present, soft, nt Central nervous system: Alert, follows command Extremities: no edema    Data Reviewed: I have personally reviewed following labs and imaging studies  CBC: Recent Labs  Lab 03/08/24 1828 03/09/24 0209 03/09/24 0514 03/09/24 1238 03/12/24 0454 03/12/24 1827 03/13/24 0459 03/13/24 1837 03/14/24 0507  WBC 12.3*  --  11.1*  --   --   --   --   --   --   NEUTROABS 9.5*  --   --   --   --   --   --   --   --   HGB 5.3*   < > 7.4*   < > 7.4* 9.0* 7.9* 8.6* 8.2*  HCT  17.9*   < > 23.1*   < > 22.7* 27.9* 24.0* 26.2* 24.5*  MCV 105.9*  --  97.5  --   --   --   --   --   --   PLT 218  --  222  --   --   --   --   --   --    < > = values in this interval not displayed.   Basic Metabolic Panel: Recent Labs  Lab 03/10/24 0910 03/11/24 0518 03/12/24 0454 03/13/24 0459 03/14/24 0507  NA 139 136 133* 129* 138  K 3.9 3.8 4.8 3.8 3.6  CL 108 104 105 101 103  CO2 23 23 17* 21* 23  GLUCOSE 89 134* 218* 194* 182*  BUN 59* 45* 47* 50* 41*  CREATININE 1.48* 1.50* 1.76* 1.65* 1.54*  CALCIUM  8.5* 8.5* 7.9* 7.8* 8.5*  MG 2.3 2.1 2.0 2.2 2.3   GFR: Estimated Creatinine Clearance: 42.8 mL/min (A) (by C-G formula based on  SCr of 1.54 mg/dL (H)). Liver Function Tests: Recent Labs  Lab 03/08/24 1828 03/09/24 0514  AST 19 20  ALT 13 11  ALKPHOS 66 54  BILITOT 0.5 0.7  PROT 6.3* 5.3*  ALBUMIN  3.0* 2.6*   No results for input(s): "LIPASE", "AMYLASE" in the last 168 hours. No results for input(s): "AMMONIA" in the last 168 hours. Coagulation Profile: Recent Labs  Lab 03/09/24 0514  INR 1.3*   Cardiac Enzymes: No results for input(s): "CKTOTAL", "CKMB", "CKMBINDEX", "TROPONINI" in the last 168 hours. BNP (last 3 results) No results for input(s): "PROBNP" in the last 8760 hours. HbA1C: No results for input(s): "HGBA1C" in the last 72 hours. CBG: Recent Labs  Lab 03/13/24 1656 03/13/24 1945 03/14/24 0731 03/14/24 1137 03/14/24 1644  GLUCAP 329* 296* 204* 213* 346*   Lipid Profile: No results for input(s): "CHOL", "HDL", "LDLCALC", "TRIG", "CHOLHDL", "LDLDIRECT" in the last 72 hours. Thyroid  Function Tests: No results for input(s): "TSH", "T4TOTAL", "FREET4", "T3FREE", "THYROIDAB" in the last 72 hours. Anemia Panel: No results for input(s): "VITAMINB12", "FOLATE", "FERRITIN", "TIBC", "IRON ", "RETICCTPCT" in the last 72 hours. Sepsis Labs: No results for input(s): "PROCALCITON", "LATICACIDVEN" in the last 168 hours.  No results found for this  or any previous visit (from the past 240 hours).       Radiology Studies: No results found.       Scheduled Meds:  allopurinol   150 mg Oral Daily   buPROPion   300 mg Oral q morning   docusate sodium   200 mg Oral QHS   fluconazole   100 mg Oral Daily   gabapentin   200 mg Oral TID   insulin  aspart  0-5 Units Subcutaneous QHS   insulin  aspart  0-9 Units Subcutaneous TID WC   insulin  glargine-yfgn  20 Units Subcutaneous Daily   levothyroxine   50 mcg Oral Q0600   metoprolol  succinate  25 mg Oral Daily   mometasone -formoterol   2 puff Inhalation BID   [START ON 03/15/2024] pantoprazole   40 mg Oral QAC breakfast   polyethylene glycol  17 g Oral Daily   rosuvastatin   40 mg Oral Daily   sodium chloride  flush  3 mL Intravenous Q12H   sodium chloride  flush  3 mL Intravenous Q12H   Continuous Infusions:  amiodarone  30 mg/hr (03/14/24 1558)     LOS: 6 days    Time spent: 35 minutes    Sherria Riemann A Garyson Stelly, MD [facility]   If 7PM-7AM, please contact night-coverage [email or web address]  03/14/2024, 5:27 PM

## 2024-03-14 NOTE — Transfer of Care (Signed)
 Immediate Anesthesia Transfer of Care Note  Patient: [name]  Procedure(s) Performed: COLONOSCOPY  Patient Location: Endoscopy Unit  Anesthesia Type:General  Level of Consciousness: awake  Airway & Oxygen  Therapy: Patient Spontanous Breathing  Post-op Assessment: Report given to RN and Post -op Vital signs reviewed and stable  Post vital signs: Reviewed and stable  Last Vitals:  Vitals Value Taken Time  BP 107/52 03/14/24 1040  Temp 36.3 C 03/14/24 1031  Pulse 64 03/14/24 1049  Resp 13 03/14/24 1049  SpO2 97 % 03/14/24 1049  Vitals shown include unfiled device data.  Last Pain:  Vitals:   03/14/24 1031  TempSrc: Temporal  PainSc: 0-No pain      Patients Stated Pain Goal: 0 (03/13/24 2039)  Complications: No notable events documented.

## 2024-03-14 NOTE — Interval H&P Note (Signed)
 History and Physical Interval Note: For colonoscopy today to further evaluate melena after normal EGD yesterday The nature of the procedure, as well as the risks, benefits, and alternatives were carefully and thoroughly reviewed with the patient. Ample time for discussion and questions allowed. The patient understood, was satisfied, and agreed to proceed.      Latest Ref Rng & Units 03/14/2024    5:07 AM 03/13/2024    6:37 PM 03/13/2024    4:59 AM  CBC  Hemoglobin 12.0 - 15.0 g/dL 8.2  8.6  7.9   Hematocrit 36.0 - 46.0 % 24.5  26.2  24.0      03/14/2024 9:05 AM  [name]  has presented today for surgery, with the diagnosis of melena.  The various methods of treatment have been discussed with the patient and family. After consideration of risks, benefits and other options for treatment, the patient has consented to  Procedure(s): COLONOSCOPY (N/A) as a surgical intervention.  The patient's history has been reviewed, patient examined, no change in status, stable for surgery.  I have reviewed the patient's chart and labs.  Questions were answered to the patient's satisfaction.     Amber Bail Manasa Spease

## 2024-03-15 DIAGNOSIS — Z5181 Encounter for therapeutic drug level monitoring: Secondary | ICD-10-CM

## 2024-03-15 DIAGNOSIS — I4819 Other persistent atrial fibrillation: Secondary | ICD-10-CM | POA: Diagnosis not present

## 2024-03-15 DIAGNOSIS — R079 Chest pain, unspecified: Secondary | ICD-10-CM | POA: Diagnosis not present

## 2024-03-15 DIAGNOSIS — Z7901 Long term (current) use of anticoagulants: Secondary | ICD-10-CM | POA: Diagnosis not present

## 2024-03-15 DIAGNOSIS — D649 Anemia, unspecified: Secondary | ICD-10-CM | POA: Diagnosis not present

## 2024-03-15 LAB — CBC
HCT: 22.7 % — ABNORMAL LOW (ref 36.0–46.0)
Hemoglobin: 7.6 g/dL — ABNORMAL LOW (ref 12.0–15.0)
MCH: 30.6 pg (ref 26.0–34.0)
MCHC: 33.5 g/dL (ref 30.0–36.0)
MCV: 91.5 fL (ref 80.0–100.0)
Platelets: 239 10*3/uL (ref 150–400)
RBC: 2.48 MIL/uL — ABNORMAL LOW (ref 3.87–5.11)
RDW: 15.8 % — ABNORMAL HIGH (ref 11.5–15.5)
WBC: 11.4 10*3/uL — ABNORMAL HIGH (ref 4.0–10.5)
nRBC: 0 % (ref 0.0–0.2)

## 2024-03-15 LAB — BASIC METABOLIC PANEL WITH GFR
Anion gap: 9 (ref 5–15)
BUN: 31 mg/dL — ABNORMAL HIGH (ref 8–23)
CO2: 24 mmol/L (ref 22–32)
Calcium: 8.2 mg/dL — ABNORMAL LOW (ref 8.9–10.3)
Chloride: 103 mmol/L (ref 98–111)
Creatinine, Ser: 1.78 mg/dL — ABNORMAL HIGH (ref 0.44–1.00)
GFR, Estimated: 32 mL/min — ABNORMAL LOW (ref >60)
Glucose, Bld: 214 mg/dL — ABNORMAL HIGH (ref 70–99)
Potassium: 3.7 mmol/L (ref 3.5–5.1)
Sodium: 136 mmol/L (ref 135–145)

## 2024-03-15 LAB — SURGICAL PATHOLOGY

## 2024-03-15 LAB — GLUCOSE, CAPILLARY
Glucose-Capillary: 212 mg/dL — ABNORMAL HIGH (ref 70–99)
Glucose-Capillary: 266 mg/dL — ABNORMAL HIGH (ref 70–99)
Glucose-Capillary: 164 mg/dL — ABNORMAL HIGH (ref 70–99)
Glucose-Capillary: 223 mg/dL — ABNORMAL HIGH (ref 70–99)

## 2024-03-15 LAB — HEMOGLOBIN AND HEMATOCRIT, BLOOD
HCT: 23.4 % — ABNORMAL LOW (ref 36.0–46.0)
Hemoglobin: 7.5 g/dL — ABNORMAL LOW (ref 12.0–15.0)

## 2024-03-15 MED ORDER — AMIODARONE HCL 200 MG PO TABS
200.0000 mg | ORAL_TABLET | Freq: Every day | ORAL | Status: DC
  Administered 2024-03-15 – 2024-03-17 (×3): 200 mg via ORAL
  Filled 2024-03-15 (×3): qty 1

## 2024-03-15 MED ORDER — POLYETHYLENE GLYCOL 3350 17 G PO PACK
17.0000 g | PACK | Freq: Two times a day (BID) | ORAL | Status: DC
  Administered 2024-03-15 – 2024-03-17 (×4): 17 g via ORAL
  Filled 2024-03-15 (×4): qty 1

## 2024-03-15 NOTE — Progress Notes (Signed)
 Cardiologist:  Camnits/McLean  Subjective:   Looks good Multiple polyps removed   Objective:  Vitals:   03/14/24 1651 03/14/24 1923 03/15/24 0311 03/15/24 0820  BP: (!) 147/70 133/67 131/64 108/62  Pulse: 76 63 71 69  Resp: 18 16 18 18   Temp: 98.2 F (36.8 C) 98.4 F (36.9 C) 98.4 F (36.9 C) 98.6 F (37 C)  TempSrc: Oral Oral Oral Oral  SpO2:  96% 96%   Weight:      Height:        Intake/Output from previous day:  Intake/Output Summary (Last 24 hours) at 03/15/2024 0830 Last data filed at 03/15/2024 1610 Gross per 24 hour  Intake 1005.25 ml  Output 600 ml  Net 405.25 ml    Physical Exam:  Pale female Edentulous Prior left CEA No murmur Lungs clear Abdomen benign Plus one bilateral  edema with stasis  Decreased peripheral pulses 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   Cardiac Studies:  ECG: afib rate 99 nonspecific ST changes LVH likely    Telemetry:  NSR this am  Echo: EF 45-50% mild LAE mild MR 09/27/23  EF 35-40% by TTE 03/11/24   Medications:    allopurinol   150 mg Oral Daily   buPROPion   300 mg Oral q morning   docusate sodium   200 mg Oral QHS   fluconazole   100 mg Oral Daily   gabapentin   200 mg Oral TID   insulin  aspart  0-5 Units Subcutaneous QHS   insulin  aspart  0-9 Units Subcutaneous TID WC   insulin  glargine-yfgn  20 Units Subcutaneous Daily   levothyroxine   50 mcg Oral Q0600   metoprolol  succinate  25 mg Oral Daily   mometasone -formoterol   2 puff Inhalation  BID   pantoprazole   40 mg Oral QAC breakfast   polyethylene glycol  17 g Oral Daily   rosuvastatin   40 mg Oral Daily   sodium chloride  flush  3 mL Intravenous Q12H   sodium chloride  flush  3 mL Intravenous Q12H      amiodarone  30 mg/hr (03/15/24 0406)    Assessment/Plan:  63 y.o. with history of CHF, PAF, CAD PVD with right transmet amputation and left fempop fifth toe amputation, left fem pop left CEA HTN, HLD DM poorly controlled and prior ETOH/cocaine abuse Admitted with GI bleed and dyspnea with chest pain troponin 106 and BNP 666  Troponin elevation:  No acute ECG changes history of small OM dx Rx medically no indication for cath continue lopressor  Not a cath candidate with active GI bleed  CHF:  lasix  with transfusions TTE with slightly lower EF 35-40% BP soft with GI bleed hydralazine  on hold when stable likely start Bidil Anemia Hct 22.7 this am GI indicated 48 hours before eliquis  resumed Can resume Saturday afternoon.  PAF:  eliquis   resume sarturday PM dose. Change to oral amiodarone  PVD: with significant LA dx post left fem pop right PCI and left CEA     Janelle Mediate 03/15/2024, 8:30 AM

## 2024-03-15 NOTE — Progress Notes (Signed)
 Physical Therapy Treatment Patient Details Name: [name] MRN: [id number] DOB: 1961/05/11 Today's Date: 03/15/2024   History of Present Illness 63 y.o. female admitted 03/08/24 with SOB, chest pain, dark stools. Workup for anemia, GIB. S/p EGD 4/30. S/p colonoscopy with multiple polyps removed 5/1. PMH includes CHF, CAD, CKD IV, DM, HTN, HLD, gout, bradycardia, COPD, PAD, PAF, tobacco use.   PT Comments  Pt progressing with mobility. Today's session focused on gait training with rollator, ambulation and therex for improving strength and activity tolerance. Pt remains limited by generalized weakness, decreased activity tolerance, and impaired balance strategies/postural reactions. Continue to recommend HHPT services to maximize functional mobility and independence upon return home.  *Patient now agreeable to HHPT services     If plan is discharge home, recommend the following: A little help with bathing/dressing/bathroom;Assistance with cooking/housework;Assist for transportation;Help with stairs or ramp for entrance;Supervision due to cognitive status   Can travel by private vehicle      Yes  Equipment Recommendations  Rollator (4 wheels) (already delivered)    Recommendations for Other Services       Precautions / Restrictions Precautions Precautions: Fall Restrictions Weight Bearing Restrictions Per Provider Order: No     Mobility  Bed Mobility               General bed mobility comments: received sitting in recliner    Transfers Overall transfer level: Needs assistance Equipment used: Rollator (4 wheels), None Transfers: Sit to/from Stand Sit to Stand: Supervision           General transfer comment: 3x repeated sit<>stand from recliner without DME; sit<>stand from recliner and rollator seat, cues to lock rollator brakes; supervision for safety    Ambulation/Gait Ambulation/Gait assistance: Contact guard assist Gait Distance (Feet): 60 Feet (+  60') Assistive device: Rollator (4 wheels) Gait Pattern/deviations: Step-through pattern, Decreased stride length, Wide base of support, Trunk flexed Gait velocity: Decreased     General Gait Details: slow gait with rollator and standby assist for safety; cues for self-monitoring activity tolerance, pt walking 60' + 60' with 1x seated rest break on rollator seat in hallway due to c/o BLE fatigue   Stairs             Wheelchair Mobility     Tilt Bed    Modified Rankin (Stroke Patients Only)       Balance Overall balance assessment: Needs assistance Sitting-balance support: No upper extremity supported, Feet supported Sitting balance-Leahy Scale: Good     Standing balance support: No upper extremity supported, During functional activity Standing balance-Leahy Scale: Fair Standing balance comment: can static stand without UE support                            Communication Communication Communication: No apparent difficulties  Cognition Arousal: Alert Behavior During Therapy: WFL for tasks assessed/performed   PT - Cognitive impairments: No apparent impairments                                Cueing    Exercises Other Exercises Other Exercises: repeated sit<>stands, seated LAQ; educ on HEP progression for home if pt does not pursue HHPT    General Comments General comments (skin integrity, edema, etc.): noted pt had declined HHPT services with case manager, asked about this further and pt now agreeable; communicated with social worker      Pertinent Vitals/Pain  Pain Assessment Pain Assessment: Faces Faces Pain Scale: Hurts little more Pain Location: R hand/wrist (attributes to gout), bilateral feet Pain Descriptors / Indicators: Grimacing, Guarding, Sore Pain Intervention(s): Monitored during session, Limited activity within patient's tolerance    Home Living                          Prior Function            PT  Goals (current goals can now be found in the care plan section) Progress towards PT goals: Progressing toward goals    Frequency    Min 2X/week      PT Plan      Co-evaluation              AM-PAC PT "6 Clicks" Mobility   Outcome Measure  Help needed turning from your back to your side while in a flat bed without using bedrails?: None Help needed moving from lying on your back to sitting on the side of a flat bed without using bedrails?: None Help needed moving to and from a bed to a chair (including a wheelchair)?: A Little Help needed standing up from a chair using your arms (e.g., wheelchair or bedside chair)?: A Little Help needed to walk in hospital room?: A Little Help needed climbing 3-5 steps with a railing? : A Lot 6 Click Score: 19    End of Session   Activity Tolerance: Patient tolerated treatment well Patient left: in chair;with bed alarm set;with call bell/phone within reach Nurse Communication: Mobility status PT Visit Diagnosis: Muscle weakness (generalized) (M62.81);Difficulty in walking, not elsewhere classified (R26.2);Pain     Time: 1610-9604 PT Time Calculation (min) (ACUTE ONLY): 23 min  Charges:    $Gait Training: 8-22 mins $Therapeutic Exercise: 8-22 mins PT General Charges $$ ACUTE PT VISIT: 1 Visit                     Blase Bur, PT, DPT Acute Rehabilitation Services  Personal: Secure Chat Rehab Office: [phone]  [name] 03/15/2024, 5:49 PM

## 2024-03-15 NOTE — Plan of Care (Signed)
  Problem: Clinical Measurements: Goal: Diagnostic test results will improve Outcome: Progressing   Problem: Coping: Goal: Ability to adjust to condition or change in health will improve Outcome: Progressing   Problem: Nutritional: Goal: Maintenance of adequate nutrition will improve Outcome: Progressing   Problem: Education: Goal: Ability to identify signs and symptoms of gastrointestinal bleeding will improve Outcome: Progressing   Problem: Fluid Volume: Goal: Will show no signs and symptoms of excessive bleeding Outcome: Progressing   Problem: Clinical Measurements: Goal: Complications related to the disease process, condition or treatment will be avoided or minimized Outcome: Progressing

## 2024-03-15 NOTE — Progress Notes (Signed)
 Occupational Therapy Treatment Patient Details Name: [name] MRN: [id number] DOB: 1961/10/20 Today's Date: 03/15/2024   History of present illness 63 y.o. female admitted 03/08/24 with SOB, chest pain, dark stools. Workup for anemia, GIB. S/p EGD 4/30. S/p colonoscopy with multiple polyps removed 5/1. PMH includes CHF, CAD, CKD IV, DM, HTN, HLD, gout, bradycardia, COPD, PAD, PAF, tobacco use.   OT comments  OT session focused on techniques for increased safety and independence with LB ADLs and training in energy conservation strategies for increased safety and independence with functional tasks with handouts provided. Pt verbalized understanding of all education and now demonstrates ability to complete LB ADLs largely with Contact guard to Min assist in sit/stand. Pt participated well in session and will benefit from reinforcement of all education. Pt VSS on RA throughout session. Pt will benefit from continued acute skilled OT services to address deficits outlined below and increase safety and independence with functional tasks. OT recommends post acute HH OT to maximize rehab potential, decrease risk of falls, and decrease risk of rehospitalization.       If plan is discharge home, recommend the following:  A little help with walking and/or transfers;A little help with bathing/dressing/bathroom;Assistance with cooking/housework;Assist for transportation;Help with stairs or ramp for entrance   Equipment Recommendations  Other (comment) (Rollator)    Recommendations for Other Services      Precautions / Restrictions Precautions Precautions: Fall Restrictions Weight Bearing Restrictions Per Provider Order: No       Mobility Bed Mobility               General bed mobility comments: Pt sitting in recliner at beginning and end of session    Transfers Overall transfer level: Needs assistance Equipment used: Rollator (4 wheels), None Transfers: Sit to/from Stand Sit to  Stand: Supervision                 Balance Overall balance assessment: Needs assistance Sitting-balance support: No upper extremity supported, Feet supported Sitting balance-Leahy Scale: Good     Standing balance support: No upper extremity supported, During functional activity Standing balance-Leahy Scale: Fair Standing balance comment: can static stand without UE support                           ADL either performed or assessed with clinical judgement   ADL Overall ADL's : Needs assistance/impaired Eating/Feeding: Modified independent;Set up;Sitting Eating/Feeding Details (indicate cue type and reason): largely Mod I with assist to open containers         Lower Body Bathing: Contact guard assist;Minimal assistance;Cueing for compensatory techniques;Sit to/from stand Lower Body Bathing Details (indicate cue type and reason): simulated at chair     Lower Body Dressing: Contact guard assist;Minimal assistance;Sit to/from stand;Cueing for compensatory techniques Lower Body Dressing Details (indicate cue type and reason): Min assist for doffing/donning socks, otherwise CGA               General ADL Comments: OT edcuated pt in and provided handouts related to energy conservation strategies for increased safety and independence with functional task. Pt verbalized understanding of all education. Pt will benefit from reinforcement of education.    Extremity/Trunk Assessment Upper Extremity Assessment Upper Extremity Assessment: Left hand dominant;Generalized weakness;RUE deficits/detail;LUE deficits/detail RUE Deficits / Details: generalized weakness; noted brusing and redness throughout UE; pain in shoulder and wrist with AROM and during functional tasks; noted decrease in edema this session as compared to last OT session  LUE Deficits / Details: generalized weakness; noted brusing and redness throughout UE   Lower Extremity Assessment Lower Extremity  Assessment: Defer to PT evaluation        Vision       Perception     Praxis     Communication Communication Communication: No apparent difficulties   Cognition Arousal: Alert Behavior During Therapy: WFL for tasks assessed/performed Cognition: Cognition impaired, No family/caregiver present to determine baseline     Awareness: Intellectual awareness intact, Online awareness intact   Attention impairment (select first level of impairment): Divided attention Executive functioning impairment (select all impairments): Problem solving, Organization OT - Cognition Comments: Pt AAOx4 and pleasant throughout session. Pt cognition largely [facility] for tasks assessed but with decreased safety awareness and mild cognitive deficits noted above.                 Following commands: Intact        Cueing   Cueing Techniques: Verbal cues, Visual cues  Exercises      Shoulder Instructions       General Comments VSS on RA throughout session    Pertinent Vitals/ Pain       Pain Assessment Pain Assessment: Faces Faces Pain Scale: Hurts a little bit Pain Location: R hand/wrist (pt attributes to gout), B feet Pain Descriptors / Indicators: Guarding, Sore Pain Intervention(s): Limited activity within patient's tolerance, Monitored during session  Home Living                                          Prior Functioning/Environment              Frequency  Min 2X/week        Progress Toward Goals  OT Goals(current goals can now be found in the care plan section)  Progress towards OT goals: Progressing toward goals  Acute Rehab OT Goals Patient Stated Goal: to return home  Plan      Co-evaluation                 AM-PAC OT "6 Clicks" Daily Activity     Outcome Measure   Help from another person eating meals?: A Little Help from another person taking care of personal grooming?: A Little Help from another person toileting, which includes  using toliet, bedpan, or urinal?: A Little Help from another person bathing (including washing, rinsing, drying)?: A Little Help from another person to put on and taking off regular upper body clothing?: A Little Help from another person to put on and taking off regular lower body clothing?: A Little 6 Click Score: 18    End of Session Equipment Utilized During Treatment: Rollator (4 wheels)  OT Visit Diagnosis: Unsteadiness on feet (R26.81);History of falling (Z91.81);Muscle weakness (generalized) (M62.81);Other (comment) (decreased activity tolerance)   Activity Tolerance Patient tolerated treatment well   Patient Left in chair;with call bell/phone within reach;with chair alarm set   Nurse Communication Mobility status        Time: 1440-1456 OT Time Calculation (min): 16 min  Charges: OT General Charges $OT Visit: 1 Visit OT Treatments $Self Care/Home Management : 8-22 mins  Aqua Denslow "Darral Ellis., OTR/L, MA Acute Rehab [phone]   Walt Gunner 03/15/2024, 6:02 PM

## 2024-03-15 NOTE — Progress Notes (Signed)
 PROGRESS NOTE    [name]  JXB:[id number] DOB: [birth year] DOA: 03/08/2024 PCP: Maryellen Snare, NP   Brief Narrative: 63 year old past medical history significant for CAD, heart failure reduced ejection fraction, diastolic heart failure, CAD, CKD stage IV, carotid stenosis status post endarterectomy, PAF, PAD, COPD, ongoing tobacco use, chronic bradycardia, gout, hypertension, diabetes type 2, hyperlipidemia, vitamin D  deficiency presented with chest pain.  Received nitroglycerin  by EMS.  She also complaining of melena.  She does take BC powder approximately 2 times per month.  Was found to have a hemoglobin of 5.3 on admission, BNP 666, rectal exam performed in the ED showed melena.  GI and cardiology following.  Underwent endoscopy which was negative for source of active bleeding.  Underwent colonoscopy 5/1 multiple polyps removed.  Per GI if either drop in hemoglobin she might need capsule endoscopy.  Okay to discharge per GI.  Call resume DOAC in 48 hours with close observation     Assessment & Plan:   Principal Problem:   GI bleed Active Problems:   Paroxysmal atrial fibrillation ([name])   Demand ischemia ([name])   CKD (chronic kidney disease), stage IV ([name])   S/P peripheral artery angioplasty - TurboHawk atherectomy; R SFA   Symptomatic anemia   HFrEF (heart failure with reduced ejection fraction) ([name])   Insulin  dependent type 2 diabetes mellitus ([name])   Peripheral artery disease ([name])   Carotid artery stenosis   COPD (chronic obstructive pulmonary disease) ([name])   Hyperlipidemia   Hypothyroidism   Chronic sinus bradycardia   Anticoagulation management encounter   PAF (paroxysmal atrial fibrillation) ([name])   History of CAD (coronary artery disease)   Continuous dependence on cigarette smoking   GAD (generalized anxiety disorder)   Heme positive stool   Chronic systolic congestive heart failure ([name])   Melena   Benign neoplasm of ascending colon   Benign neoplasm of  transverse colon   Benign neoplasm of descending colon   Benign neoplasm of sigmoid colon   Persistent atrial fibrillation ([name])   1-GI bleed, acute blood loss anemia Patient presented with a hemoglobin of 5.3, presented with melena She has received 2 units of packed red blood cells on 4/26. Hemoglobin down to 7.4 another unit of packed red blood cells was transfused.  Hemoglobin  stable at 8./  GI following, endoscopy was delayed until hemoglobin was stable above 7. Underwent endoscopy 4/30: Esophageal plaques were found consistent with candidiasis.  Normal stomach. Started on fluconazole .   Normal examined duodenal. -No source of bleeding on endoscopy, underwent colonoscopy, multiples polyps found and had polypectomy.  May resume DOAC in 48 hours per GI.  Also patient could be discharged home and if she has recurrence of bleeding she might require capsule endoscopy Hb down to 7.6. she doesn't know what color was stool yesterday.  Plan to keep one more day to monitor hb. Plan to resume eliquis  Saturday night if hb stable.    Demand ischemia, troponin elevation: Evaluated by cardiology not cath candidate due to active bleeding.  No indication for cath per cardiology Cardiology consulted per anesthesiology request for clearance for endoscopy procedure. Echo performed ejection fraction 35 to 40%, global hypokinesis.  Mildly decreased in ejection fraction compared to 2024.  Ischemic evaluation deferred by cardiology currently.  Chronic systolic heart failure appreciate cardiology follow-up  PAF holding Eliquis  in the setting of GI bleed.  Currently on IV amiodarone  Cardiology following Per GI okay to resume DOAC in 48 hours, lost follow-up due to recent  polypectomy Transition to oral amiodarone .   PVD with significant LAD dx post left femoral pop right PCI and left CEA  Insulin  dependent type 2 diabetes mellitus ([name]) - Last A1c 12.6% on 01/23/2024 - Continue on SSI and CBG monitoring  for now  -Resume long-acting insulin   Peripheral artery disease ([name]) - Eliquis  on hold - Continue Crestor    COPD (chronic obstructive pulmonary disease) ([name]) - No signs/symptoms of exacerbation   Hyperlipidemia - Continue Crestor    Hypothyroidism - Continue Synthroid    GAD (generalized anxiety disorder) - Continue Wellbutrin    History of CAD (coronary artery disease) - Eliquis  on hold - Continue statin, Toprol      Estimated body mass index is 38.19 kg/m as calculated from the following:   Height as of this encounter: 5\' 3"  (1.6 m).   Weight as of this encounter: 97.8 kg.   DVT prophylaxis: CDs Code Status: Full code Family Communication: Disposition Plan:  Status is: Inpatient Remains inpatient appropriate because: Management of GI bleed, home when cleared by cardiology    Consultants:  GI Cardiology  Procedures:  Echo  Antimicrobials:    Subjective: Had BM yesterday but did not look at the stool to see what color was it She would like to make sure that her hemoglobin is stable before she goes home  Objective: Vitals:   03/14/24 1923 03/15/24 0311 03/15/24 0820 03/15/24 1409  BP: 133/67 131/64 108/62 (!) 130/44  Pulse: 63 71 69 (!) 55  Resp: 16 18 18 18   Temp: 98.4 F (36.9 C) 98.4 F (36.9 C) 98.6 F (37 C) 98.4 F (36.9 C)  TempSrc: Oral Oral Oral Oral  SpO2: 96% 96%    Weight:      Height:        Intake/Output Summary (Last 24 hours) at 03/15/2024 1556 Last data filed at 03/15/2024 1500 Gross per 24 hour  Intake 1243.8 ml  Output 600 ml  Net 643.8 ml   Filed Weights   03/09/24 1101  Weight: 97.8 kg    Examination:  General exam: NAD Respiratory system: CTA Cardiovascular system: S 1, S 2 IRR Gastrointestinal system: BS present, soft, nt Central nervous system: alert Extremities: no edema    Data Reviewed: I have personally reviewed following labs and imaging studies  CBC: Recent Labs  Lab 03/08/24 1828 03/09/24 0209  03/09/24 0514 03/09/24 1238 03/13/24 0459 03/13/24 1837 03/14/24 0507 03/15/24 0634 03/15/24 1116  WBC 12.3*  --  11.1*  --   --   --   --  11.4*  --   NEUTROABS 9.5*  --   --   --   --   --   --   --   --   HGB 5.3*   < > 7.4*   < > 7.9* 8.6* 8.2* 7.6* 7.5*  HCT 17.9*   < > 23.1*   < > 24.0* 26.2* 24.5* 22.7* 23.4*  MCV 105.9*  --  97.5  --   --   --   --  91.5  --   PLT 218  --  222  --   --   --   --  239  --    < > = values in this interval not displayed.   Basic Metabolic Panel: Recent Labs  Lab 03/10/24 0910 03/11/24 0518 03/12/24 0454 03/13/24 0459 03/14/24 0507 03/15/24 0634  NA 139 136 133* 129* 138 136  K 3.9 3.8 4.8 3.8 3.6 3.7  CL 108 104 105 101 103  103  CO2 23 23 17* 21* 23 24  GLUCOSE 89 134* 218* 194* 182* 214*  BUN 59* 45* 47* 50* 41* 31*  CREATININE 1.48* 1.50* 1.76* 1.65* 1.54* 1.78*  CALCIUM  8.5* 8.5* 7.9* 7.8* 8.5* 8.2*  MG 2.3 2.1 2.0 2.2 2.3  --    GFR: Estimated Creatinine Clearance: 37 mL/min (A) (by C-G formula based on SCr of 1.78 mg/dL (H)). Liver Function Tests: Recent Labs  Lab 03/08/24 1828 03/09/24 0514  AST 19 20  ALT 13 11  ALKPHOS 66 54  BILITOT 0.5 0.7  PROT 6.3* 5.3*  ALBUMIN  3.0* 2.6*   No results for input(s): "LIPASE", "AMYLASE" in the last 168 hours. No results for input(s): "AMMONIA" in the last 168 hours. Coagulation Profile: Recent Labs  Lab 03/09/24 0514  INR 1.3*   Cardiac Enzymes: No results for input(s): "CKTOTAL", "CKMB", "CKMBINDEX", "TROPONINI" in the last 168 hours. BNP (last 3 results) No results for input(s): "PROBNP" in the last 8760 hours. HbA1C: No results for input(s): "HGBA1C" in the last 72 hours. CBG: Recent Labs  Lab 03/14/24 1137 03/14/24 1644 03/14/24 2102 03/15/24 0814 03/15/24 1139  GLUCAP 213* 346* 188* 212* 266*   Lipid Profile: No results for input(s): "CHOL", "HDL", "LDLCALC", "TRIG", "CHOLHDL", "LDLDIRECT" in the last 72 hours. Thyroid  Function Tests: No results for  input(s): "TSH", "T4TOTAL", "FREET4", "T3FREE", "THYROIDAB" in the last 72 hours. Anemia Panel: No results for input(s): "VITAMINB12", "FOLATE", "FERRITIN", "TIBC", "IRON ", "RETICCTPCT" in the last 72 hours. Sepsis Labs: No results for input(s): "PROCALCITON", "LATICACIDVEN" in the last 168 hours.  No results found for this or any previous visit (from the past 240 hours).       Radiology Studies: No results found.       Scheduled Meds:  allopurinol   150 mg Oral Daily   amiodarone   200 mg Oral Daily   buPROPion   300 mg Oral q morning   docusate sodium   200 mg Oral QHS   fluconazole   100 mg Oral Daily   gabapentin   200 mg Oral TID   insulin  aspart  0-5 Units Subcutaneous QHS   insulin  aspart  0-9 Units Subcutaneous TID WC   insulin  glargine-yfgn  20 Units Subcutaneous Daily   levothyroxine   50 mcg Oral Q0600   metoprolol  succinate  25 mg Oral Daily   mometasone -formoterol   2 puff Inhalation BID   pantoprazole   40 mg Oral QAC breakfast   polyethylene glycol  17 g Oral BID   rosuvastatin   40 mg Oral Daily   sodium chloride  flush  3 mL Intravenous Q12H   sodium chloride  flush  3 mL Intravenous Q12H   Continuous Infusions:     LOS: 7 days    Time spent: 35 minutes    Kaci Dillie A Naveena Eyman, MD [facility]   If 7PM-7AM, please contact night-coverage [email or web address]  03/15/2024, 3:56 PM

## 2024-03-16 DIAGNOSIS — I5022 Chronic systolic (congestive) heart failure: Secondary | ICD-10-CM | POA: Diagnosis not present

## 2024-03-16 DIAGNOSIS — D649 Anemia, unspecified: Secondary | ICD-10-CM | POA: Diagnosis not present

## 2024-03-16 DIAGNOSIS — I480 Paroxysmal atrial fibrillation: Secondary | ICD-10-CM | POA: Diagnosis not present

## 2024-03-16 LAB — CBC
HCT: 24.2 % — ABNORMAL LOW (ref 36.0–46.0)
Hemoglobin: 7.9 g/dL — ABNORMAL LOW (ref 12.0–15.0)
MCH: 30.3 pg (ref 26.0–34.0)
MCHC: 32.6 g/dL (ref 30.0–36.0)
MCV: 92.7 fL (ref 80.0–100.0)
Platelets: 265 10*3/uL (ref 150–400)
RBC: 2.61 MIL/uL — ABNORMAL LOW (ref 3.87–5.11)
RDW: 15.4 % (ref 11.5–15.5)
WBC: 10.9 10*3/uL — ABNORMAL HIGH (ref 4.0–10.5)
nRBC: 0 % (ref 0.0–0.2)

## 2024-03-16 LAB — GLUCOSE, CAPILLARY
Glucose-Capillary: 216 mg/dL — ABNORMAL HIGH (ref 70–99)
Glucose-Capillary: 212 mg/dL — ABNORMAL HIGH (ref 70–99)
Glucose-Capillary: 249 mg/dL — ABNORMAL HIGH (ref 70–99)
Glucose-Capillary: 236 mg/dL — ABNORMAL HIGH (ref 70–99)

## 2024-03-16 MED ORDER — BISACODYL 5 MG PO TBEC
5.0000 mg | DELAYED_RELEASE_TABLET | Freq: Once | ORAL | Status: AC
  Administered 2024-03-16: 5 mg via ORAL
  Filled 2024-03-16: qty 1

## 2024-03-16 MED ORDER — APIXABAN 5 MG PO TABS
5.0000 mg | ORAL_TABLET | Freq: Two times a day (BID) | ORAL | Status: DC
  Administered 2024-03-16 – 2024-03-17 (×2): 5 mg via ORAL
  Filled 2024-03-16 (×2): qty 1

## 2024-03-16 MED ORDER — ISOSORB DINITRATE-HYDRALAZINE 20-37.5 MG PO TABS
1.0000 | ORAL_TABLET | Freq: Two times a day (BID) | ORAL | Status: DC
  Administered 2024-03-16 – 2024-03-17 (×3): 1 via ORAL
  Filled 2024-03-16 (×3): qty 1

## 2024-03-16 MED ORDER — TORSEMIDE 20 MG PO TABS
40.0000 mg | ORAL_TABLET | Freq: Every day | ORAL | Status: DC
  Administered 2024-03-16 – 2024-03-17 (×2): 40 mg via ORAL
  Filled 2024-03-16 (×2): qty 2

## 2024-03-16 NOTE — Progress Notes (Signed)
 PHARMACY - ANTICOAGULATION CONSULT NOTE  Pharmacy Consult for Eliquis  Indication: atrial fibrillation  Allergies  Allergen Reactions   Gabapentin  Nausea And Vomiting and Other (See Comments)    POSSIBLE SHAKING   Lyrica  [Pregabalin ] Other (See Comments)    Shaking     Patient Measurements: Height: 5\' 3"  (160 cm) Weight: 97.8 kg (215 lb 9.6 oz) IBW/kg (Calculated) : 52.4 HEPARIN  DW (KG): 75.2  Vital Signs: Temp: 98.4 F (36.9 C) (05/03 0734) Temp Source: Oral (05/03 0734) BP: 135/49 (05/03 0734) Pulse Rate: 69 (05/03 0304)  Labs: Recent Labs    03/14/24 0507 03/15/24 0634 03/15/24 1116 03/16/24 0800  HGB 8.2* 7.6* 7.5* 7.9*  HCT 24.5* 22.7* 23.4* 24.2*  PLT  --  239  --  265  CREATININE 1.54* 1.78*  --   --     Estimated Creatinine Clearance: 37 mL/min (A) (by C-G formula based on SCr of 1.78 mg/dL (H)).   Medical History: Past Medical History:  Diagnosis Date   Acute renal failure ([name])    Acute respiratory failure with hypoxia ([name]) 12/02/2019   Alcohol abuse    Alcoholic cirrhosis ([name])    Anemia    Anxiety    Arthritis    "knees" (11/26/2018)   B12 deficiency    CAD (coronary artery disease)    a. 11/10/2014 Cath: LM nl, LAD min irregs, D1 30 ost, D2 50d, LCX 21m, OM1 80 p/m (1.5 mm vessel), OM2 46m, RCA nondom 24m-->med rx.. Demand ischemia in the setting of rapid a-fib.   Cardiomyopathy ([name])    Carotid artery disease ([name])    a. 01/2015 Carotid Angio: RICA 100, LICA 95p; b. 02/2015 s/p L CEA; c. 05/2019 Carotid U/S: RICA 100. RECA >50. LICA 1-39%.   Cellulitis    lower extremities   Cellulitis in diabetic foot ([name]) 07/08/2019   CHF (congestive heart failure) ([name])    Chronic combined systolic and diastolic CHF (congestive heart failure) ([name])    a. 10/2014 Echo: EF 40-45%; b. 10/2018 Echo: EF 45-50%, gr2 DD; c. 11/2019 Echo: EF 50%, mild LVH, gr2 DD (restrictive), antlat HK, Nl RV fxn. Mild BAE. RVSP 59.20mmHg.   CKD (chronic kidney disease), stage  III ([name])    Cocaine abuse ([name])    COPD (chronic obstructive pulmonary disease) ([name])    Critical ischemia of foot ([name]) 06/07/2021   Critical limb ischemia     Critical lower limb ischemia ([name])    Demand ischemia ([name]) 10/29/2014   Depression    Diabetes mellitus without complication ([name])    Diabetic peripheral neuropathy ([name])    DVT (deep venous thrombosis) ([name])    Dyspnea    Elevated troponin    a. Chronic elevation.   Fall 05/05/2021   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[name])    Peripheral arterial disease/critical limb ischemia     GERD (gastroesophageal reflux disease)    Hyperlipemia    Hypertension    Hypokalemia    Hypomagnesemia    Hypothyroidism    Marijuana abuse    Narcotic abuse ([name])    Noncompliance    NSVT (nonsustained ventricular tachycardia) ([name])    Obesity    Osteomyelitis ([name]) 06/21/2019   PAF (paroxysmal atrial fibrillation) ([name])    Paroxysmal atrial tachycardia ([name])    Peripheral arterial disease ([name])    a. 01/2015 Angio/PTA: RSFA 99 (atherectomy/pta) - 1 vessel runoff via diff dzs peroneal; b. 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   Pneumonia    "  once or twice" (11/26/2018)   Poorly controlled type 2 diabetes mellitus ([name])    Renal disorder    Renal insufficiency    a. 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   SIRS (systemic inflammatory response syndrome) ([name]) 04/06/2017   Sleep apnea    "couldn't handle wearing the mask" (11/26/2018)   Symptomatic bradycardia    a. Avoid AV blocking agent per EP. Prev req temp wire in 2017.   Tobacco abuse    Wrist fracture, left, closed, initial encounter 01/29/2015    Medications:  Scheduled:   allopurinol   150 mg Oral Daily   amiodarone   200 mg Oral Daily   buPROPion   300 mg Oral q morning   docusate sodium   200 mg Oral QHS   fluconazole   100 mg Oral Daily   gabapentin   200 mg Oral TID   insulin  aspart  0-5 Units Subcutaneous QHS   insulin  aspart  0-9 Units Subcutaneous TID WC    insulin  glargine-yfgn  20 Units Subcutaneous Daily   isosorbide -hydrALAZINE   1 tablet Oral BID   levothyroxine   50 mcg Oral Q0600   metoprolol  succinate  25 mg Oral Daily   mometasone -formoterol   2 puff Inhalation BID   pantoprazole   40 mg Oral QAC breakfast   polyethylene glycol  17 g Oral BID   rosuvastatin   40 mg Oral Daily   sodium chloride  flush  3 mL Intravenous Q12H   sodium chloride  flush  3 mL Intravenous Q12H   torsemide   40 mg Oral Daily   Infusions:   Assessment: 63 yo female presenting with melena and underwent colonoscopy on 5/1. On Eliquis  PTA for afib. Per GI and cardiology okay to restart Eliquis  48 hours post colonoscopy. Pharmacy has been consulted to restart Eliquis  this PM.   Goal of Therapy:  Monitor platelets by anticoagulation protocol: Yes   Plan:  Eliquis  5 mg BID to start tonight at 2200  Monitor CBC and for s/sx of bleeding  Kwaku Mostafa I Desiree Fleming 03/16/2024,8:37 AM

## 2024-03-16 NOTE — Progress Notes (Signed)
 Cardiologist:  Camnits/McLean  Subjective:   Looks good Multiple polyps removed   Objective:  Vitals:   03/15/24 1409 03/15/24 2022 03/16/24 0304 03/16/24 0734  BP: (!) 130/44 (!) 125/53 134/70 (!) 135/49  Pulse: (!) 55 (!) 56 69   Resp: 18 18 18 16   Temp: 98.4 F (36.9 C) 98.4 F (36.9 C) 98.4 F (36.9 C) 98.4 F (36.9 C)  TempSrc: Oral Oral Oral Oral  SpO2:  99% 98% 97%  Weight:      Height:        Intake/Output from previous day:  Intake/Output Summary (Last 24 hours) at 03/16/2024 0828 Last data filed at 03/16/2024 0813 Gross per 24 hour  Intake 1038.55 ml  Output 850 ml  Net 188.55 ml    Physical Exam:  Pale female Edentulous Prior left CEA No murmur Lungs clear Abdomen benign Plus one bilateral  edema with stasis  Decreased peripheral pulses 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   Lab Results: Basic Metabolic Panel: Recent Labs    03/14/24 0507 03/15/24 0634  NA 138 136  K 3.6 3.7  CL 103 103  CO2 23 24  GLUCOSE 182* 214*  BUN 41* 31*  CREATININE 1.54* 1.78*  CALCIUM  8.5* 8.2*  MG 2.3  --     CBC: Recent Labs    03/15/24 0634 03/15/24 1116  WBC 11.4*  --   HGB 7.6* 7.5*  HCT 22.7* 23.4*  MCV 91.5  --   PLT 239  --      Imaging: No results found.   Cardiac Studies:  ECG: afib rate 99 nonspecific ST changes LVH likely    Telemetry:  NSR this am  Echo: EF 45-50% mild LAE mild MR 09/27/23  EF 35-40% by TTE 03/11/24   Medications:    allopurinol   150 mg Oral Daily   amiodarone   200 mg Oral Daily   buPROPion   300 mg Oral q morning   docusate sodium   200 mg Oral QHS   fluconazole   100 mg Oral Daily   gabapentin   200 mg Oral TID   insulin  aspart  0-5 Units Subcutaneous QHS   insulin  aspart  0-9 Units Subcutaneous TID WC   insulin  glargine-yfgn  20 Units Subcutaneous Daily   levothyroxine   50 mcg Oral Q0600   metoprolol  succinate  25 mg Oral Daily   mometasone -formoterol   2 puff Inhalation BID    pantoprazole   40 mg Oral QAC breakfast   polyethylene glycol  17 g Oral BID   rosuvastatin   40 mg Oral Daily   sodium chloride  flush  3 mL Intravenous Q12H   sodium chloride  flush  3 mL Intravenous Q12H        Assessment/Plan:  63 y.o. with history of CHF, PAF, CAD PVD with right transmet amputation and left fempop fifth toe amputation, left fem pop left CEA HTN, HLD DM poorly controlled and prior ETOH/cocaine abuse Admitted with GI bleed and dyspnea with chest pain troponin 106 and BNP 666  Troponin elevation:  No acute ECG changes history of small OM dx Rx medically no indication for cath continue lopressor  Not a cath candidate with active GI bleed  CHF:  was on demedex 80 mg bid Held during acute GI bleeding and volume depletion will resume at lower dose 40 mg daily EF 35-40%   Anemia Hct 22.7 yesterday pending  this am GI indicated 48 hours before eliquis  resumed Can resume Saturday afternoon.  PAF:  eliquis  resume sarturday PM dose. Change to oral amiodarone  200 mg daily  PVD: with significant LA dx post left fem pop right PCI and left CEA    Cardiology will sign off Arranging f/u with Dr Micael Adas [name] 03/16/2024, 8:28 AM

## 2024-03-16 NOTE — Progress Notes (Signed)
 PROGRESS NOTE    [name]  NFA:[id number] DOB: 1961/06/26 DOA: 03/08/2024 PCP: Maryellen Snare, NP   Brief Narrative: 63 year old past medical history significant for CAD, heart failure reduced ejection fraction, diastolic heart failure, CAD, CKD stage IV, carotid stenosis status post endarterectomy, PAF, PAD, COPD, ongoing tobacco use, chronic bradycardia, gout, hypertension, diabetes type 2, hyperlipidemia, vitamin D  deficiency presented with chest pain.  Received nitroglycerin  by EMS.  She also complaining of melena.  She does take BC powder approximately 2 times per month.  Was found to have a hemoglobin of 5.3 on admission, BNP 666, rectal exam performed in the ED showed melena.  GI and cardiology following.  Underwent endoscopy which was negative for source of active bleeding.  Underwent colonoscopy 5/1 multiple polyps removed.  Per GI if either drop in hemoglobin she might need capsule endoscopy.  Okay to discharge per GI.  Call resume DOAC in 48 hours with close observation     Assessment & Plan:   Principal Problem:   GI bleed Active Problems:   Paroxysmal atrial fibrillation ([name])   Demand ischemia ([name])   CKD (chronic kidney disease), stage IV ([name])   S/P peripheral artery angioplasty - TurboHawk atherectomy; R SFA   Symptomatic anemia   HFrEF (heart failure with reduced ejection fraction) ([name])   Insulin  dependent type 2 diabetes mellitus ([name])   Peripheral artery disease ([name])   Carotid artery stenosis   COPD (chronic obstructive pulmonary disease) ([name])   Hyperlipidemia   Hypothyroidism   Chronic sinus bradycardia   Anticoagulation management encounter   PAF (paroxysmal atrial fibrillation) ([name])   History of CAD (coronary artery disease)   Continuous dependence on cigarette smoking   GAD (generalized anxiety disorder)   Heme positive stool   Chronic systolic congestive heart failure ([name])   Melena   Benign neoplasm of ascending colon   Benign neoplasm of  transverse colon   Benign neoplasm of descending colon   Benign neoplasm of sigmoid colon   Persistent atrial fibrillation ([name])   1-GI bleed, acute blood loss anemia Patient presented with a hemoglobin of 5.3, presented with melena She has received 2 units of packed red blood cells on 4/26. Hemoglobin down to 7.4 another unit of packed red blood cells was transfused.  Hemoglobin  stable at 8./  GI following, endoscopy was delayed until hemoglobin was stable above 7. Underwent endoscopy 4/30: Esophageal plaques were found consistent with candidiasis.  Normal stomach. Started on fluconazole .   Normal examined duodenal. -No source of bleeding on endoscopy, underwent colonoscopy, multiples polyps found and had polypectomy.  May resume DOAC in 48 hours per GI.  Also patient could be discharged home and if she has recurrence of bleeding she might require capsule endoscopy Plan to resume eliquis  tonight.  Hb stable 7.9--patient has not had BM, she wants to make sure she is not bleeding.   Demand ischemia, troponin elevation: Evaluated by cardiology not cath candidate due to active bleeding.  No indication for cath per cardiology Cardiology consulted per anesthesiology request for clearance for endoscopy procedure. Echo performed ejection fraction 35 to 40%, global hypokinesis.  Mildly decreased in ejection fraction compared to 2024.  Ischemic evaluation deferred by cardiology currently.  Chronic systolic heart failure appreciate cardiology follow-up  PAF holding Eliquis  in the setting of GI bleed.  Currently on IV amiodarone  Cardiology following Per GI okay to resume DOAC in 48 hours, lost follow-up due to recent polypectomy Transition to oral amiodarone .   PVD with significant  LAD dx post left femoral pop right PCI and left CEA  Insulin  dependent type 2 diabetes mellitus ([name]) - Last A1c 12.6% on 01/23/2024 - Continue on SSI and CBG monitoring for now  -Resume long-acting  insulin   Peripheral artery disease ([name]) - Eliquis  on hold - Continue Crestor    COPD (chronic obstructive pulmonary disease) ([name]) - No signs/symptoms of exacerbation   Hyperlipidemia - Continue Crestor    Hypothyroidism - Continue Synthroid    GAD (generalized anxiety disorder) - Continue Wellbutrin    History of CAD (coronary artery disease) - Eliquis  on hold - Continue statin, Toprol      Estimated body mass index is 38.19 kg/m as calculated from the following:   Height as of this encounter: 5\' 3"  (1.6 m).   Weight as of this encounter: 97.8 kg.   DVT prophylaxis: CDs Code Status: Full code Family Communication: Disposition Plan:  Status is: Inpatient Remains inpatient appropriate because: Management of GI bleed, home when cleared by cardiology    Consultants:  GI Cardiology  Procedures:  Echo  Antimicrobials:    Subjective: No BM yet. Denies abdominal pain. She doesn't feel comfortable going home, she wants to make sure she is not bleeding. We can observed after resumption of Eliquis  Objective: Vitals:   03/15/24 2022 03/16/24 0304 03/16/24 0734 03/16/24 1138  BP: (!) 125/53 134/70 (!) 135/49 (!) 101/54  Pulse: (!) 56 69  (!) 51  Resp: 18 18 16 18   Temp: 98.4 F (36.9 C) 98.4 F (36.9 C) 98.4 F (36.9 C) 97.8 F (36.6 C)  TempSrc: Oral Oral Oral Oral  SpO2: 99% 98% 97% 98%  Weight:      Height:        Intake/Output Summary (Last 24 hours) at 03/16/2024 1625 Last data filed at 03/16/2024 0813 Gross per 24 hour  Intake 360 ml  Output 850 ml  Net -490 ml   Filed Weights   03/09/24 1101  Weight: 97.8 kg    Examination:  General exam: NAD Respiratory system: CTA Cardiovascular system: S 1, S 2 IRR Gastrointestinal system: BS present, soft, nt Central nervous system: alert Extremities: no edema    Data Reviewed: I have personally reviewed following labs and imaging studies  CBC: Recent Labs  Lab 03/13/24 1837 03/14/24 0507  03/15/24 0634 03/15/24 1116 03/16/24 0800  WBC  --   --  11.4*  --  10.9*  HGB 8.6* 8.2* 7.6* 7.5* 7.9*  HCT 26.2* 24.5* 22.7* 23.4* 24.2*  MCV  --   --  91.5  --  92.7  PLT  --   --  239  --  265   Basic Metabolic Panel: Recent Labs  Lab 03/10/24 0910 03/11/24 0518 03/12/24 0454 03/13/24 0459 03/14/24 0507 03/15/24 0634  NA 139 136 133* 129* 138 136  K 3.9 3.8 4.8 3.8 3.6 3.7  CL 108 104 105 101 103 103  CO2 23 23 17* 21* 23 24  GLUCOSE 89 134* 218* 194* 182* 214*  BUN 59* 45* 47* 50* 41* 31*  CREATININE 1.48* 1.50* 1.76* 1.65* 1.54* 1.78*  CALCIUM  8.5* 8.5* 7.9* 7.8* 8.5* 8.2*  MG 2.3 2.1 2.0 2.2 2.3  --    GFR: Estimated Creatinine Clearance: 37 mL/min (A) (by C-G formula based on SCr of 1.78 mg/dL (H)). Liver Function Tests: No results for input(s): "AST", "ALT", "ALKPHOS", "BILITOT", "PROT", "ALBUMIN " in the last 168 hours.  No results for input(s): "LIPASE", "AMYLASE" in the last 168 hours. No results for input(s): "AMMONIA" in the  last 168 hours. Coagulation Profile: No results for input(s): "INR", "PROTIME" in the last 168 hours.  Cardiac Enzymes: No results for input(s): "CKTOTAL", "CKMB", "CKMBINDEX", "TROPONINI" in the last 168 hours. BNP (last 3 results) No results for input(s): "PROBNP" in the last 8760 hours. HbA1C: No results for input(s): "HGBA1C" in the last 72 hours. CBG: Recent Labs  Lab 03/15/24 1139 03/15/24 1648 03/15/24 2053 03/16/24 0733 03/16/24 1201  GLUCAP 266* 164* 223* 216* 212*   Lipid Profile: No results for input(s): "CHOL", "HDL", "LDLCALC", "TRIG", "CHOLHDL", "LDLDIRECT" in the last 72 hours. Thyroid  Function Tests: No results for input(s): "TSH", "T4TOTAL", "FREET4", "T3FREE", "THYROIDAB" in the last 72 hours. Anemia Panel: No results for input(s): "VITAMINB12", "FOLATE", "FERRITIN", "TIBC", "IRON ", "RETICCTPCT" in the last 72 hours. Sepsis Labs: No results for input(s): "PROCALCITON", "LATICACIDVEN" in the last 168  hours.  No results found for this or any previous visit (from the past 240 hours).       Radiology Studies: No results found.       Scheduled Meds:  allopurinol   150 mg Oral Daily   amiodarone   200 mg Oral Daily   apixaban   5 mg Oral BID   bisacodyl   5 mg Oral Once   buPROPion   300 mg Oral q morning   docusate sodium   200 mg Oral QHS   fluconazole   100 mg Oral Daily   gabapentin   200 mg Oral TID   insulin  aspart  0-5 Units Subcutaneous QHS   insulin  aspart  0-9 Units Subcutaneous TID WC   insulin  glargine-yfgn  20 Units Subcutaneous Daily   isosorbide -hydrALAZINE   1 tablet Oral BID   levothyroxine   50 mcg Oral Q0600   metoprolol  succinate  25 mg Oral Daily   mometasone -formoterol   2 puff Inhalation BID   pantoprazole   40 mg Oral QAC breakfast   polyethylene glycol  17 g Oral BID   rosuvastatin   40 mg Oral Daily   sodium chloride  flush  3 mL Intravenous Q12H   sodium chloride  flush  3 mL Intravenous Q12H   torsemide   40 mg Oral Daily   Continuous Infusions:     LOS: 8 days    Time spent: 35 minutes    Marvelous Bouwens A Marvalene Barrett, MD [facility]   If 7PM-7AM, please contact night-coverage [email or web address]  03/16/2024, 4:25 PM

## 2024-03-16 NOTE — Plan of Care (Signed)
  Problem: Health Behavior/Discharge Planning: Goal: Ability to manage health-related needs will improve Outcome: Progressing   Problem: Clinical Measurements: Goal: Ability to maintain clinical measurements within normal limits will improve Outcome: Progressing Goal: Will remain free from infection Outcome: Progressing Goal: Diagnostic test results will improve Outcome: Progressing Goal: Respiratory complications will improve Outcome: Progressing Goal: Cardiovascular complication will be avoided Outcome: Progressing   Problem: Activity: Goal: Risk for activity intolerance will decrease Outcome: Progressing   Problem: Nutrition: Goal: Adequate nutrition will be maintained Outcome: Progressing   Problem: Coping: Goal: Level of anxiety will decrease Outcome: Progressing   Problem: Elimination: Goal: Will not experience complications related to bowel motility Outcome: Progressing Goal: Will not experience complications related to urinary retention Outcome: Progressing   Problem: Pain Managment: Goal: General experience of comfort will improve and/or be controlled Outcome: Progressing   Problem: Safety: Goal: Ability to remain free from injury will improve Outcome: Progressing   Problem: Skin Integrity: Goal: Risk for impaired skin integrity will decrease Outcome: Progressing   Problem: Education: Goal: Ability to describe self-care measures that may prevent or decrease complications (Diabetes Survival Skills Education) will improve Outcome: Progressing Goal: Individualized Educational Video(s) Outcome: Progressing   Problem: Coping: Goal: Ability to adjust to condition or change in health will improve Outcome: Progressing   Problem: Fluid Volume: Goal: Ability to maintain a balanced intake and output will improve Outcome: Progressing   Problem: Health Behavior/Discharge Planning: Goal: Ability to identify and utilize available resources and services will  improve Outcome: Progressing Goal: Ability to manage health-related needs will improve Outcome: Progressing   Problem: Metabolic: Goal: Ability to maintain appropriate glucose levels will improve Outcome: Progressing   Problem: Nutritional: Goal: Maintenance of adequate nutrition will improve Outcome: Progressing Goal: Progress toward achieving an optimal weight will improve Outcome: Progressing   Problem: Skin Integrity: Goal: Risk for impaired skin integrity will decrease Outcome: Progressing   Problem: Tissue Perfusion: Goal: Adequacy of tissue perfusion will improve Outcome: Progressing   Problem: Education: Goal: Ability to describe self-care measures that may prevent or decrease complications (Diabetes Survival Skills Education) will improve Outcome: Progressing Goal: Individualized Educational Video(s) Outcome: Progressing   Problem: Coping: Goal: Ability to adjust to condition or change in health will improve Outcome: Progressing   Problem: Fluid Volume: Goal: Ability to maintain a balanced intake and output will improve Outcome: Progressing   Problem: Health Behavior/Discharge Planning: Goal: Ability to identify and utilize available resources and services will improve Outcome: Progressing Goal: Ability to manage health-related needs will improve Outcome: Progressing   Problem: Metabolic: Goal: Ability to maintain appropriate glucose levels will improve Outcome: Progressing   Problem: Nutritional: Goal: Maintenance of adequate nutrition will improve Outcome: Progressing Goal: Progress toward achieving an optimal weight will improve Outcome: Progressing   Problem: Skin Integrity: Goal: Risk for impaired skin integrity will decrease Outcome: Progressing   Problem: Tissue Perfusion: Goal: Adequacy of tissue perfusion will improve Outcome: Progressing   Problem: Education: Goal: Ability to identify signs and symptoms of gastrointestinal bleeding will  improve Outcome: Progressing   Problem: Bowel/Gastric: Goal: Will show no signs and symptoms of gastrointestinal bleeding Outcome: Progressing   Problem: Fluid Volume: Goal: Will show no signs and symptoms of excessive bleeding Outcome: Progressing   Problem: Clinical Measurements: Goal: Complications related to the disease process, condition or treatment will be avoided or minimized Outcome: Progressing

## 2024-03-17 ENCOUNTER — Encounter: Payer: Self-pay | Admitting: Internal Medicine

## 2024-03-17 DIAGNOSIS — R079 Chest pain, unspecified: Secondary | ICD-10-CM | POA: Diagnosis not present

## 2024-03-17 DIAGNOSIS — D649 Anemia, unspecified: Secondary | ICD-10-CM | POA: Diagnosis not present

## 2024-03-17 LAB — GLUCOSE, CAPILLARY
Glucose-Capillary: 225 mg/dL — ABNORMAL HIGH (ref 70–99)
Glucose-Capillary: 282 mg/dL — ABNORMAL HIGH (ref 70–99)
Glucose-Capillary: 296 mg/dL — ABNORMAL HIGH (ref 70–99)

## 2024-03-17 LAB — CBC
HCT: 24.5 % — ABNORMAL LOW (ref 36.0–46.0)
Hemoglobin: 7.7 g/dL — ABNORMAL LOW (ref 12.0–15.0)
MCH: 29.7 pg (ref 26.0–34.0)
MCHC: 31.4 g/dL (ref 30.0–36.0)
MCV: 94.6 fL (ref 80.0–100.0)
Platelets: 272 10*3/uL (ref 150–400)
RBC: 2.59 MIL/uL — ABNORMAL LOW (ref 3.87–5.11)
RDW: 14.9 % (ref 11.5–15.5)
WBC: 11.8 10*3/uL — ABNORMAL HIGH (ref 4.0–10.5)
nRBC: 0 % (ref 0.0–0.2)

## 2024-03-17 MED ORDER — TORSEMIDE 40 MG PO TABS: 40.0000 mg | ORAL_TABLET | Freq: Every day | ORAL | 0 refills | Status: DC

## 2024-03-17 MED ORDER — INSULIN LISPRO (1 UNIT DIAL) 100 UNIT/ML (KWIKPEN): 15.0000 [IU] | PEN_INJECTOR | Freq: Three times a day (TID) | SUBCUTANEOUS | 1 refills | Status: DC

## 2024-03-17 MED ORDER — ISOSORB DINITRATE-HYDRALAZINE 20-37.5 MG PO TABS: 1.0000 | ORAL_TABLET | Freq: Two times a day (BID) | ORAL | 0 refills | Status: DC

## 2024-03-17 MED ORDER — AMIODARONE HCL 200 MG PO TABS: 200.0000 mg | ORAL_TABLET | Freq: Every day | ORAL | 1 refills | Status: DC

## 2024-03-17 MED ORDER — FOLIC ACID 1 MG PO TABS: 1.0000 mg | ORAL_TABLET | Freq: Every day | ORAL | Status: DC

## 2024-03-17 MED ORDER — FOLIC ACID 1 MG PO TABS: 1.0000 mg | ORAL_TABLET | Freq: Every day | ORAL | 0 refills | Status: AC

## 2024-03-17 MED ORDER — FLUCONAZOLE 100 MG PO TABS: 100.0000 mg | ORAL_TABLET | Freq: Every day | ORAL | 0 refills | Status: AC

## 2024-03-17 MED ORDER — LANTUS SOLOSTAR 100 UNIT/ML ~~LOC~~ SOPN
30.0000 [IU] | PEN_INJECTOR | Freq: Every day | SUBCUTANEOUS | 0 refills | Status: DC
Start: 2024-03-17 — End: 2024-08-19

## 2024-03-17 MED ORDER — DOCUSATE SODIUM 100 MG PO CAPS: 200.0000 mg | ORAL_CAPSULE | Freq: Every day | ORAL | 0 refills | Status: AC

## 2024-03-17 NOTE — Plan of Care (Signed)
  Problem: Clinical Measurements: Goal: Respiratory complications will improve Outcome: Progressing Goal: Cardiovascular complication will be avoided Outcome: Progressing   Problem: Activity: Goal: Risk for activity intolerance will decrease Outcome: Progressing   Problem: Nutrition: Goal: Adequate nutrition will be maintained Outcome: Progressing   Problem: Pain Managment: Goal: General experience of comfort will improve and/or be controlled Outcome: Progressing   Problem: Safety: Goal: Ability to remain free from injury will improve Outcome: Progressing   Problem: Education: Goal: Ability to identify signs and symptoms of gastrointestinal bleeding will improve Outcome: Progressing

## 2024-03-17 NOTE — Progress Notes (Signed)
 Mobility Specialist Progress Note:   03/17/24 0910  Mobility  Activity Ambulated with assistance in hallway  Level of Assistance Contact guard assist, steadying assist  Assistive Device Four wheel walker  Distance Ambulated (ft) 60 ft  Activity Response Tolerated well  Mobility Referral Yes  Mobility visit 1 Mobility  Mobility Specialist Start Time (ACUTE ONLY) 0910  Mobility Specialist Stop Time (ACUTE ONLY) 0920  Mobility Specialist Time Calculation (min) (ACUTE ONLY) 10 min   Pt agreeable to mobility session. Required only CGA for ambulation with rollator d/c weakness. Pt c/o R shoulder pain and bilat knee pain. X1 seated rest break taken d/t fatigue. Pt back in chair with all needs met.   [name] Mobility Specialist Please contact via SecureChat or  Rehab office at [phone]

## 2024-03-17 NOTE — Plan of Care (Signed)
 Problem: Health Behavior/Discharge Planning: Goal: Ability to manage health-related needs will improve 03/17/2024 1128 by Sunnie England, RN Outcome: Adequate for Discharge 03/17/2024 1128 by Sunnie England, RN Outcome: Adequate for Discharge   Problem: Clinical Measurements: Goal: Ability to maintain clinical measurements within normal limits will improve 03/17/2024 1128 by Sunnie England, RN Outcome: Adequate for Discharge 03/17/2024 1128 by Sunnie England, RN Outcome: Adequate for Discharge Goal: Will remain free from infection 03/17/2024 1128 by Sunnie England, RN Outcome: Adequate for Discharge 03/17/2024 1128 by Sunnie England, RN Outcome: Adequate for Discharge Goal: Diagnostic test results will improve 03/17/2024 1128 by Sunnie England, RN Outcome: Adequate for Discharge 03/17/2024 1128 by Sunnie England, RN Outcome: Adequate for Discharge Goal: Respiratory complications will improve 03/17/2024 1128 by Sunnie England, RN Outcome: Adequate for Discharge 03/17/2024 1128 by Sunnie England, RN Outcome: Adequate for Discharge Goal: Cardiovascular complication will be avoided 03/17/2024 1128 by Sunnie England, RN Outcome: Adequate for Discharge 03/17/2024 1128 by Sunnie England, RN Outcome: Adequate for Discharge   Problem: Activity: Goal: Risk for activity intolerance will decrease 03/17/2024 1128 by Sunnie England, RN Outcome: Adequate for Discharge 03/17/2024 1128 by Sunnie England, RN Outcome: Adequate for Discharge   Problem: Nutrition: Goal: Adequate nutrition will be maintained 03/17/2024 1128 by Sunnie England, RN Outcome: Adequate for Discharge 03/17/2024 1128 by Sunnie England, RN Outcome: Adequate for Discharge   Problem: Coping: Goal: Level of anxiety will decrease 03/17/2024 1128 by Sunnie England, RN Outcome: Adequate for Discharge 03/17/2024 1128 by Sunnie England, RN Outcome: Adequate for Discharge    Problem: Elimination: Goal: Will not experience complications related to bowel motility 03/17/2024 1128 by Sunnie England, RN Outcome: Adequate for Discharge 03/17/2024 1128 by Sunnie England, RN Outcome: Adequate for Discharge Goal: Will not experience complications related to urinary retention 03/17/2024 1128 by Sunnie England, RN Outcome: Adequate for Discharge 03/17/2024 1128 by Sunnie England, RN Outcome: Adequate for Discharge   Problem: Pain Managment: Goal: General experience of comfort will improve and/or be controlled 03/17/2024 1128 by Sunnie England, RN Outcome: Adequate for Discharge 03/17/2024 1128 by Sunnie England, RN Outcome: Adequate for Discharge   Problem: Safety: Goal: Ability to remain free from injury will improve 03/17/2024 1128 by Sunnie England, RN Outcome: Adequate for Discharge 03/17/2024 1128 by Sunnie England, RN Outcome: Adequate for Discharge   Problem: Skin Integrity: Goal: Risk for impaired skin integrity will decrease 03/17/2024 1128 by Sunnie England, RN Outcome: Adequate for Discharge 03/17/2024 1128 by Sunnie England, RN Outcome: Adequate for Discharge   Problem: Education: Goal: Ability to describe self-care measures that may prevent or decrease complications (Diabetes Survival Skills Education) will improve 03/17/2024 1128 by Sunnie England, RN Outcome: Adequate for Discharge 03/17/2024 1128 by Sunnie England, RN Outcome: Adequate for Discharge Goal: Individualized Educational Video(s) 03/17/2024 1128 by Sunnie England, RN Outcome: Adequate for Discharge 03/17/2024 1128 by Sunnie England, RN Outcome: Adequate for Discharge   Problem: Coping: Goal: Ability to adjust to condition or change in health will improve 03/17/2024 1128 by Sunnie England, RN Outcome: Adequate for Discharge 03/17/2024 1128 by Sunnie England, RN Outcome: Adequate for Discharge   Problem: Fluid Volume: Goal: Ability to  maintain a balanced intake and output will improve 03/17/2024 1128 by Sunnie England, RN Outcome: Adequate for Discharge 03/17/2024 1128 by Sunnie England, RN Outcome: Adequate for  Discharge   Problem: Health Behavior/Discharge Planning: Goal: Ability to identify and utilize available resources and services will improve 03/17/2024 1128 by Sunnie England, RN Outcome: Adequate for Discharge 03/17/2024 1128 by Sunnie England, RN Outcome: Adequate for Discharge Goal: Ability to manage health-related needs will improve 03/17/2024 1128 by Sunnie England, RN Outcome: Adequate for Discharge 03/17/2024 1128 by Sunnie England, RN Outcome: Adequate for Discharge   Problem: Metabolic: Goal: Ability to maintain appropriate glucose levels will improve 03/17/2024 1128 by Sunnie England, RN Outcome: Adequate for Discharge 03/17/2024 1128 by Sunnie England, RN Outcome: Adequate for Discharge   Problem: Nutritional: Goal: Maintenance of adequate nutrition will improve 03/17/2024 1128 by Sunnie England, RN Outcome: Adequate for Discharge 03/17/2024 1128 by Sunnie England, RN Outcome: Adequate for Discharge Goal: Progress toward achieving an optimal weight will improve 03/17/2024 1128 by Sunnie England, RN Outcome: Adequate for Discharge 03/17/2024 1128 by Sunnie England, RN Outcome: Adequate for Discharge   Problem: Skin Integrity: Goal: Risk for impaired skin integrity will decrease 03/17/2024 1128 by Sunnie England, RN Outcome: Adequate for Discharge 03/17/2024 1128 by Sunnie England, RN Outcome: Adequate for Discharge   Problem: Tissue Perfusion: Goal: Adequacy of tissue perfusion will improve 03/17/2024 1128 by Sunnie England, RN Outcome: Adequate for Discharge 03/17/2024 1128 by Sunnie England, RN Outcome: Adequate for Discharge   Problem: Education: Goal: Ability to describe self-care measures that may prevent or decrease complications (Diabetes  Survival Skills Education) will improve 03/17/2024 1128 by Sunnie England, RN Outcome: Adequate for Discharge 03/17/2024 1128 by Sunnie England, RN Outcome: Adequate for Discharge Goal: Individualized Educational Video(s) 03/17/2024 1128 by Sunnie England, RN Outcome: Adequate for Discharge 03/17/2024 1128 by Sunnie England, RN Outcome: Adequate for Discharge   Problem: Coping: Goal: Ability to adjust to condition or change in health will improve 03/17/2024 1128 by Sunnie England, RN Outcome: Adequate for Discharge 03/17/2024 1128 by Sunnie England, RN Outcome: Adequate for Discharge   Problem: Fluid Volume: Goal: Ability to maintain a balanced intake and output will improve 03/17/2024 1128 by Sunnie England, RN Outcome: Adequate for Discharge 03/17/2024 1128 by Sunnie England, RN Outcome: Adequate for Discharge   Problem: Health Behavior/Discharge Planning: Goal: Ability to identify and utilize available resources and services will improve 03/17/2024 1128 by Sunnie England, RN Outcome: Adequate for Discharge 03/17/2024 1128 by Sunnie England, RN Outcome: Adequate for Discharge Goal: Ability to manage health-related needs will improve 03/17/2024 1128 by Sunnie England, RN Outcome: Adequate for Discharge 03/17/2024 1128 by Sunnie England, RN Outcome: Adequate for Discharge   Problem: Metabolic: Goal: Ability to maintain appropriate glucose levels will improve 03/17/2024 1128 by Sunnie England, RN Outcome: Adequate for Discharge 03/17/2024 1128 by Sunnie England, RN Outcome: Adequate for Discharge   Problem: Nutritional: Goal: Maintenance of adequate nutrition will improve 03/17/2024 1128 by Sunnie England, RN Outcome: Adequate for Discharge 03/17/2024 1128 by Sunnie England, RN Outcome: Adequate for Discharge Goal: Progress toward achieving an optimal weight will improve 03/17/2024 1128 by Sunnie England, RN Outcome: Adequate for  Discharge 03/17/2024 1128 by Sunnie England, RN Outcome: Adequate for Discharge   Problem: Skin Integrity: Goal: Risk for impaired skin integrity will decrease 03/17/2024 1128 by Sunnie England, RN Outcome: Adequate for Discharge 03/17/2024 1128 by Sunnie England, RN Outcome: Adequate for Discharge   Problem: Tissue Perfusion: Goal: Adequacy of tissue  perfusion will improve 03/17/2024 1128 by Sunnie England, RN Outcome: Adequate for Discharge 03/17/2024 1128 by Sunnie England, RN Outcome: Adequate for Discharge   Problem: Education: Goal: Ability to identify signs and symptoms of gastrointestinal bleeding will improve 03/17/2024 1128 by Sunnie England, RN Outcome: Adequate for Discharge 03/17/2024 1128 by Sunnie England, RN Outcome: Adequate for Discharge   Problem: Bowel/Gastric: Goal: Will show no signs and symptoms of gastrointestinal bleeding 03/17/2024 1128 by Sunnie England, RN Outcome: Adequate for Discharge 03/17/2024 1128 by Sunnie England, RN Outcome: Adequate for Discharge   Problem: Fluid Volume: Goal: Will show no signs and symptoms of excessive bleeding 03/17/2024 1128 by Sunnie England, RN Outcome: Adequate for Discharge 03/17/2024 1128 by Sunnie England, RN Outcome: Adequate for Discharge   Problem: Clinical Measurements: Goal: Complications related to the disease process, condition or treatment will be avoided or minimized 03/17/2024 1128 by Sunnie England, RN Outcome: Adequate for Discharge 03/17/2024 1128 by Sunnie England, RN Outcome: Adequate for Discharge

## 2024-03-17 NOTE — Discharge Summary (Signed)
 Physician Discharge Summary   Patient: [name] MRN: [id number] DOB: 1961/10/02  Admit date:     03/08/2024  Discharge date: 03/17/24  Discharge Physician: Danette Duos   PCP: Maryellen Snare, NP   Recommendations at discharge:    Close follow up with PCP to monitor Hb.  Follow up with cardiology to FU HF  Discharge Diagnoses: Principal Problem:   GI bleed Active Problems:   Paroxysmal atrial fibrillation ([name])   Demand ischemia ([name])   CKD (chronic kidney disease), stage IV ([name])   S/P peripheral artery angioplasty - TurboHawk atherectomy; R SFA   Symptomatic anemia   HFrEF (heart failure with reduced ejection fraction) ([name])   Insulin  dependent type 2 diabetes mellitus ([name])   Peripheral artery disease ([name])   Carotid artery stenosis   COPD (chronic obstructive pulmonary disease) ([name])   Hyperlipidemia   Hypothyroidism   Chronic sinus bradycardia   Anticoagulation management encounter   PAF (paroxysmal atrial fibrillation) ([name])   History of CAD (coronary artery disease)   Continuous dependence on cigarette smoking   GAD (generalized anxiety disorder)   Heme positive stool   Chronic systolic congestive heart failure ([name])   Melena   Benign neoplasm of ascending colon   Benign neoplasm of transverse colon   Benign neoplasm of descending colon   Benign neoplasm of sigmoid colon   Persistent atrial fibrillation ([name])  Resolved Problems:   Chest pain  Hospital Course: 63 year old past medical history significant for CAD, heart failure reduced ejection fraction, diastolic heart failure, CAD, CKD stage IV, carotid stenosis status post endarterectomy, PAF, PAD, COPD, ongoing tobacco use, chronic bradycardia, gout, hypertension, diabetes type 2, hyperlipidemia, vitamin D  deficiency presented with chest pain.  Received nitroglycerin  by EMS.  She also complaining of melena.  She does take BC powder approximately 2 times per month.  Was found to have a hemoglobin of  5.3 on admission, BNP 666, rectal exam performed in the ED showed melena.  GI and cardiology following.  Underwent endoscopy which was negative for source of active bleeding.  Underwent colonoscopy 5/1 multiple polyps removed.  Per GI if either drop in hemoglobin she might need capsule endoscopy.  Okay to discharge per GI.  Call resume DOAC in 48 hours with close observation     Assessment and Plan: 1-GI bleed, acute blood loss anemia Patient presented with a hemoglobin of 5.3, presented with melena She has received 2 units of packed red blood cells on 4/26. Hemoglobin down to 7.4 another unit of packed red blood cells was transfused.  Hemoglobin  stable at 8./  GI following, endoscopy was delayed until hemoglobin was stable above 7. Underwent endoscopy 4/30: Esophageal plaques were found consistent with candidiasis.  Normal stomach. Started on fluconazole .   Normal examined duodenal. -No source of bleeding on endoscopy, underwent colonoscopy, multiples polyps found and had polypectomy.  May resume DOAC in 48 hours per GI.  Also patient could be discharged home and if she has recurrence of bleeding she might require capsule endoscopy Resume eliquis   5/03. Hb relatively stable at 7,.7, no further BM, melena.  Stable for discharge. Close follow up with PCP  Demand ischemia, troponin elevation: Evaluated by cardiology not cath candidate due to active bleeding.  No indication for cath per cardiology Cardiology consulted per anesthesiology request for clearance for endoscopy procedure. Echo performed ejection fraction 35 to 40%, global hypokinesis.  Mildly decreased in ejection fraction compared to 2024.  Ischemic evaluation deferred by cardiology currently.  Chronic systolic heart failure appreciate cardiology follow-up   PAF holding Eliquis  in the setting of GI bleed.  Currently on IV amiodarone  Cardiology following Per GI okay to resume DOAC in 48 hours, lost follow-up due to recent  polypectomy Transition to oral amiodarone .    PVD with significant LAD dx post left femoral pop right PCI and left CEA   Insulin  dependent type 2 diabetes mellitus ([name]) - Last A1c 12.6% on 01/23/2024 - Continue on SSI and CBG monitoring for now  -Resume home regimen at discharge   Peripheral artery disease ([name]) - Eliquis  on hold - Continue Crestor    COPD (chronic obstructive pulmonary disease) ([name]) - No signs/symptoms of exacerbation   Hyperlipidemia - Continue Crestor    Hypothyroidism - Continue Synthroid    GAD (generalized anxiety disorder) - Continue Wellbutrin    History of CAD (coronary artery disease) - Eliquis  resumed.  - Continue statin, Toprol            Consultants: GI, Cardiology  Procedures performed: Colonoscopy Disposition: Home Diet recommendation:  Discharge Diet Orders (From admission, onward)     Start     Ordered   03/17/24 0000  Diet - low sodium heart healthy        03/17/24 1109           Cardiac diet DISCHARGE MEDICATION: Allergies as of 03/17/2024       Reactions   Gabapentin  Nausea And Vomiting, Other (See Comments)   POSSIBLE SHAKING   Lyrica  [pregabalin ] Other (See Comments)   Shaking        Medication List     STOP taking these medications    hydrALAZINE  25 MG tablet Commonly known as: APRESOLINE        TAKE these medications    acetaminophen  500 MG tablet Commonly known as: TYLENOL  Take 1,000 mg by mouth every 6 (six) hours as needed for headache (pain).   albuterol  108 (90 Base) MCG/ACT inhaler Commonly known as: VENTOLIN  HFA USE 2 PUFFS BY MOUTH EVERY FOUR HOURS, AS NEEDED, FOR COUGHING/WHEEZING   allopurinol  100 MG tablet Commonly known as: ZYLOPRIM  Take 100 mg by mouth daily.   amiodarone  200 MG tablet Commonly known as: PACERONE  Take 1 tablet (200 mg total) by mouth daily. Start taking on: Mar 18, 2024   apixaban  5 MG Tabs tablet Commonly known as: Eliquis  Take 1 tablet (5 mg total) by mouth  2 (two) times daily.   budesonide -formoterol  160-4.5 MCG/ACT inhaler Commonly known as: Symbicort  Inhale 2 puffs into the lungs 2 (two) times daily.   buPROPion  300 MG 24 hr tablet Commonly known as: WELLBUTRIN  XL Take 300 mg by mouth every morning.   colchicine  0.6 MG tablet Take 1.2 mg by mouth See admin instructions. Take 1.2 mg for flair up and an hour later take 0.6 mg if needed   docusate sodium  100 MG capsule Commonly known as: COLACE Take 2 capsules (200 mg total) by mouth at bedtime.   erythromycin ophthalmic ointment Place 1 Application into the right eye 2 (two) times daily.   FISH OIL PO Take 1 tablet by mouth daily.   fluconazole  100 MG tablet Commonly known as: DIFLUCAN  Take 1 tablet (100 mg total) by mouth daily for 10 days. Start taking on: Mar 18, 2024   fluticasone  50 MCG/ACT nasal spray Commonly known as: FLONASE Place 1 spray into both nostrils as needed for allergies or rhinitis.   folic acid  1 MG tablet Commonly known as: FOLVITE  Take 1 tablet (1 mg total) by mouth  daily.   gabapentin  100 MG capsule Commonly known as: Neurontin  Take 1 capsule (100 mg total) by mouth 3 (three) times daily.   ICY HOT PRO NO MESS EX Apply 1 Application topically 3 (three) times daily as needed (arms, neuropathy lower extrimity, gout in hand.).   insulin  lispro 100 UNIT/ML KwikPen Commonly known as: HUMALOG  Inject 15 Units into the skin 3 (three) times daily with meals. Plus sliding scale, max dose 70 units /day What changed: how much to take   isosorbide -hydrALAZINE  20-37.5 MG tablet Commonly known as: BIDIL Take 1 tablet by mouth 2 (two) times daily.   Lantus  SoloStar 100 UNIT/ML Solostar Pen Generic drug: insulin  glargine Inject 30 Units into the skin daily. What changed: how much to take   levothyroxine  50 MCG tablet Commonly known as: SYNTHROID  TAKE 1 TABLET (50 MCG TOTAL) BY MOUTH DAILY BEFORE BREAKFAST (AM)   metoprolol  succinate 25 MG 24 hr  tablet Commonly known as: TOPROL -XL Take 1 tablet (25 mg total) by mouth daily.   Nyamyc  powder Generic drug: nystatin  Apply 1 Application topically 2 (two) times daily.   nystatin  cream Commonly known as: MYCOSTATIN  Apply 1 Application topically 2 (two) times daily as needed for dry skin.   pantoprazole  40 MG tablet Commonly known as: PROTONIX  TAKE 1 TABLET (40 MG TOTAL) BY MOUTH DAILY(AM)   polyethylene glycol 17 g packet Commonly known as: MIRALAX  / GLYCOLAX  Take 17 g by mouth daily as needed for moderate constipation.   rosuvastatin  40 MG tablet Commonly known as: CRESTOR  TAKE 1 TABLET (40 MG TOTAL) BY MOUTH DAILY What changed: when to take this   tirzepatide 7.5 MG/0.5ML Pen Commonly known as: MOUNJARO Inject 7.5 mg into the skin once a week.   Torsemide  40 MG Tabs Take 40 mg by mouth daily. Start taking on: Mar 18, 2024 What changed:  medication strength how much to take when to take this   triamcinolone  ointment 0.1 % Commonly known as: KENALOG  Apply topically 2 (two) times daily. What changed:  how much to take when to take this reasons to take this   Vitamin D3 50 MCG (2000 UT) capsule Take 1 capsule by mouth every morning.   VITAMIN E PO Take 1 tablet by mouth daily.               Durable Medical Equipment  (From admission, onward)           Start     Ordered   03/13/24 1637  For home use only DME 4 wheeled rolling walker with seat  Once       Question:  Patient needs a walker to treat with the following condition  Answer:  General weakness   03/13/24 1636            Follow-up Information     [facility] Follow up.   Why: Rolling walker with seat Contact information: Located in: [employer] Address: [address], [place], [place] [address] Phone: [phone]        Maryellen Snare, NP Follow up in 1 week(s).   Contact information: [address] [place] [address] [phone]                 Discharge Exam: [name] Weights   03/09/24 1101  Weight: 97.8 kg   General; NAD  Condition at discharge: stable  The results of significant diagnostics from this hospitalization (including imaging, microbiology, ancillary and laboratory) are listed below for reference.  Imaging Studies: ECHOCARDIOGRAM COMPLETE Result Date: 03/11/2024    ECHOCARDIOGRAM REPORT   Patient Name:   NISHI BASSETTE Date of Exam: 03/11/2024 Medical Rec #:  [id number]     Height:       63.0 in Accession #:    [id number]    Weight:       215.6 lb Date of Birth:  Sep 20, 1961    BSA:          1.996 m Patient Age:    62 years      BP:           131/65 mmHg Patient Gender: F             HR:           86 bpm. Exam Location:  Inpatient Procedure: 2D Echo, Cardiac Doppler, Color Doppler and Intracardiac            Opacification Agent (Both Spectral and Color Flow Doppler were            utilized during procedure). Indications:    R07.9* Chest pain, unspecified  History:        Patient has prior history of Echocardiogram examinations, most                 recent 09/27/2023. CHF, CAD, Abnormal ECG, COPD,                 Arrythmias:Atrial Fibrillation, Signs/Symptoms:Dyspnea and                 Shortness of Breath; Risk Factors:Diabetes, Dyslipidemia,                 Current Smoker and Sleep Apnea. ETOH. Substance abuse.  Sonographer:    Raynelle Callow RDCS Referring Phys: [id number] Awilda Bogus WEAVER  Sonographer Comments: Technically difficult study due to poor echo windows, patient is obese and suboptimal parasternal window. Image acquisition challenging due to patient body habitus. IMPRESSIONS  1. Left ventricular ejection fraction, by estimation, is 35 to 40%. The left ventricle has moderately decreased function. The left ventricle demonstrates global hypokinesis. The left ventricular internal cavity size was upper limit of normal. Left ventricular diastolic parameters are consistent with Grade I diastolic dysfunction (impaired  relaxation).  2. Right ventricular systolic function is mildly reduced. The right ventricular size is normal. There is moderately elevated pulmonary artery systolic pressure. The estimated right ventricular systolic pressure is 55.9 mmHg.  3. The mitral valve is degenerative. Moderate mitral valve regurgitation. No evidence of mitral stenosis.  4. The aortic valve was not well visualized. Aortic valve regurgitation is not visualized. No aortic stenosis is present.  5. The inferior vena cava is dilated in size with >50% respiratory variability, suggesting right atrial pressure of 8 mmHg. Comparison(s): A prior study was performed on 09/27/2023. Reduction in LVEF from 45-50% to 35-40% and RWMAs. FINDINGS  Left Ventricle: Left ventricular ejection fraction, by estimation, is 35 to 40%. The left ventricle has moderately decreased function. The left ventricle demonstrates global hypokinesis. Definity  contrast agent was given IV to delineate the left ventricular endocardial borders. The left ventricular internal cavity size was upper limit of normal. There is no left ventricular hypertrophy. Left ventricular diastolic parameters are consistent with Grade I diastolic dysfunction (impaired relaxation).  LV Wall Scoring: The mid inferoseptal segment, apical septal segment, apical anterior segment, apical inferior segment, and apex are hypokinetic. Right Ventricle: The right ventricular size is normal. No increase in right ventricular wall thickness. Right ventricular  systolic function is mildly reduced. There is moderately elevated pulmonary artery systolic pressure. The tricuspid regurgitant velocity is 3.46 m/s, and with an assumed right atrial pressure of 8 mmHg, the estimated right ventricular systolic pressure is 55.9 mmHg. Left Atrium: Left atrial size was normal in size. Right Atrium: Right atrial size was normal in size. Pericardium: There is no evidence of pericardial effusion. Mitral Valve: The mitral valve is  degenerative in appearance. Mild mitral annular calcification. Moderate mitral valve regurgitation, with centrally-directed jet. No evidence of mitral valve stenosis. Tricuspid Valve: The tricuspid valve is grossly normal. Tricuspid valve regurgitation is mild . No evidence of tricuspid stenosis. Aortic Valve: The aortic valve was not well visualized. Aortic valve regurgitation is not visualized. No aortic stenosis is present. Pulmonic Valve: The pulmonic valve was not well visualized. Pulmonic valve regurgitation is not visualized. No evidence of pulmonic stenosis. Aorta: The aortic root and ascending aorta are structurally normal, with no evidence of dilitation. Venous: The inferior vena cava is dilated in size with greater than 50% respiratory variability, suggesting right atrial pressure of 8 mmHg. IAS/Shunts: There is redundancy of the interatrial septum. The interatrial septum was not well visualized.  LEFT VENTRICLE PLAX 2D LVIDd:         5.80 cm      Diastology LVIDs:         5.30 cm      LV e' medial:    7.18 cm/s LV PW:         0.90 cm      LV E/e' medial:  14.1 LV IVS:        1.10 cm      LV e' lateral:   10.60 cm/s LVOT diam:     2.30 cm      LV E/e' lateral: 9.5 LV SV:         64 LV SV Index:   32 LVOT Area:     4.15 cm  LV Volumes (MOD) LV vol d, MOD A2C: 187.0 ml LV vol d, MOD A4C: 155.0 ml LV vol s, MOD A2C: 122.0 ml LV vol s, MOD A4C: 106.0 ml LV SV MOD A2C:     65.0 ml LV SV MOD A4C:     155.0 ml LV SV MOD BP:      52.7 ml RIGHT VENTRICLE            IVC RV S prime:     9.57 cm/s  IVC diam: 2.20 cm TAPSE (M-mode): 1.3 cm LEFT ATRIUM             Index        RIGHT ATRIUM           Index LA diam:        3.20 cm 1.60 cm/m   RA Area:     12.30 cm LA Vol (A2C):   53.8 ml 26.95 ml/m  RA Volume:   24.90 ml  12.47 ml/m LA Vol (A4C):   35.8 ml 17.93 ml/m LA Biplane Vol: 45.1 ml 22.59 ml/m  AORTIC VALVE LVOT Vmax:   80.70 cm/s LVOT Vmean:  55.400 cm/s LVOT VTI:    0.153 m  AORTA Ao Root diam: 3.30 cm  Ao Asc diam:  3.40 cm MITRAL VALVE                  TRICUSPID VALVE MV Area (PHT): 4.39 cm       TR Peak grad:   47.9 mmHg MV Decel  Time: 173 msec       TR Vmax:        346.00 cm/s MR Peak grad:    119.7 mmHg MR Mean grad:    77.0 mmHg    SHUNTS MR Vmax:         547.00 cm/s  Systemic VTI:  0.15 m MR Vmean:        413.0 cm/s   Systemic Diam: 2.30 cm MR PISA:         1.57 cm MR PISA Eff ROA: 11 mm MR PISA Radius:  0.50 cm MV E velocity: 101.00 cm/s MV A velocity: 83.00 cm/s MV E/A ratio:  1.22 Sunit Tolia Electronically signed by Olinda Bertrand Signature Date/Time: 03/11/2024/1:31:54 PM    Final    DG Chest Port 1 View Result Date: 03/08/2024 CLINICAL DATA:  chest pain EXAM: PORTABLE CHEST 1 VIEW COMPARISON:  Chest x-ray 01/03/2024, CT chest 02/14/2023 FINDINGS: The heart and mediastinal contours are unchanged. Atherosclerotic plaque. Bibasilar atelectasis. No focal consolidation. No pulmonary edema. No pleural effusion. No pneumothorax. No acute osseous abnormality. IMPRESSION: 1. No active disease. 2.  Aortic Atherosclerosis (ICD10-I70.0). Electronically Signed   By: Morgane  Naveau M.D.   On: 03/08/2024 18:48    Microbiology: Results for orders placed or performed during the hospital encounter of 01/03/24  Resp panel by RT-PCR (RSV, Flu A&B, Covid) Anterior Nasal Swab     Status: None   Collection Time: 01/03/24  4:41 PM   Specimen: Anterior Nasal Swab  Result Value Ref Range Status   SARS Coronavirus 2 by RT PCR NEGATIVE NEGATIVE Final   Influenza A by PCR NEGATIVE NEGATIVE Final   Influenza B by PCR NEGATIVE NEGATIVE Final    Comment: (NOTE) The Xpert Xpress SARS-CoV-2/FLU/RSV plus assay is intended as an aid in the diagnosis of influenza from Nasopharyngeal swab specimens and should not be used as a sole basis for treatment. Nasal washings and aspirates are unacceptable for Xpert Xpress SARS-CoV-2/FLU/RSV testing.  Fact Sheet for Patients: BloggerCourse.com  Fact  Sheet for Healthcare Providers: SeriousBroker.it  This test is not yet approved or cleared by the [place]  FDA and has been authorized for detection and/or diagnosis of SARS-CoV-2 by FDA under an Emergency Use Authorization (EUA). This EUA will remain in effect (meaning this test can be used) for the duration of the COVID-19 declaration under Section 564(b)(1) of the Act, 21 U.S.C. section 360bbb-3(b)(1), unless the authorization is terminated or revoked.     Resp Syncytial Virus by PCR NEGATIVE NEGATIVE Final    Comment: (NOTE) Fact Sheet for Patients: BloggerCourse.com  Fact Sheet for Healthcare Providers: SeriousBroker.it  This test is not yet approved or cleared by the [place]  FDA and has been authorized for detection and/or diagnosis of SARS-CoV-2 by FDA under an Emergency Use Authorization (EUA). This EUA will remain in effect (meaning this test can be used) for the duration of the COVID-19 declaration under Section 564(b)(1) of the Act, 21 U.S.C. section 360bbb-3(b)(1), unless the authorization is terminated or revoked.  Performed at [facility], [address], [place], [place] [address]    *Note: Due to a large number of results and/or encounters for the requested time period, some results have not been displayed. A complete set of results can be found in Results Review.    Labs: CBC: Recent Labs  Lab 03/14/24 0507 03/15/24 0634 03/15/24 1116 03/16/24 0800 03/17/24 0844  WBC  --  11.4*  --  10.9* 11.8*  HGB 8.2*  7.6* 7.5* 7.9* 7.7*  HCT 24.5* 22.7* 23.4* 24.2* 24.5*  MCV  --  91.5  --  92.7 94.6  PLT  --  239  --  265 272   Basic Metabolic Panel: Recent Labs  Lab 03/11/24 0518 03/12/24 0454 03/13/24 0459 03/14/24 0507 03/15/24 0634  NA 136 133* 129* 138 136  K 3.8 4.8 3.8 3.6 3.7  CL 104 105 101 103 103  CO2 23 17* 21* 23 24  GLUCOSE 134* 218* 194* 182*  214*  BUN 45* 47* 50* 41* 31*  CREATININE 1.50* 1.76* 1.65* 1.54* 1.78*  CALCIUM  8.5* 7.9* 7.8* 8.5* 8.2*  MG 2.1 2.0 2.2 2.3  --    Liver Function Tests: No results for input(s): "AST", "ALT", "ALKPHOS", "BILITOT", "PROT", "ALBUMIN " in the last 168 hours. CBG: Recent Labs  Lab 03/16/24 1626 03/16/24 2048 03/17/24 0013 03/17/24 0351 03/17/24 0816  GLUCAP 249* 236* 296* 225* 282*    Discharge time spent: greater than 30 minutes.  Signed: Danette Duos, MD [facility] 03/17/2024

## 2024-03-18 ENCOUNTER — Telehealth (HOSPITAL_COMMUNITY): Payer: Self-pay | Admitting: Cardiology

## 2024-03-18 ENCOUNTER — Other Ambulatory Visit (HOSPITAL_COMMUNITY): Payer: Self-pay

## 2024-03-18 ENCOUNTER — Encounter (HOSPITAL_COMMUNITY): Payer: Self-pay | Admitting: Internal Medicine

## 2024-03-18 MED ORDER — TORSEMIDE 20 MG PO TABS
40.0000 mg | ORAL_TABLET | Freq: Two times a day (BID) | ORAL | 3 refills | Status: DC
Start: 2024-03-18 — End: 2024-06-16

## 2024-03-18 MED ORDER — POTASSIUM CHLORIDE CRYS ER 20 MEQ PO TBCR: 40.0000 meq | EXTENDED_RELEASE_TABLET | Freq: Every day | ORAL | 3 refills | Status: DC

## 2024-03-18 NOTE — Telephone Encounter (Signed)
 [name] called to report increase in weight. [T was just released from hospital 5/4  weight today 231  weight prior to admission 216  Previous torsemide  at 100 BID and discharge summary has it decreased 40 daily    Pt reports bloating Not SOB, no wheezing otherwise stable

## 2024-03-18 NOTE — Progress Notes (Signed)
 Paramedicine Encounter    Patient ID: [name], female    DOB: 1961/03/21, 63 y.o.   MRN: [id number]   Complaints-pain in rt shoulder/pain all over   Edema-yes  Compliance with meds-yes  Pill box filled-4 days worth of what she has left  If so, by whom-paramedic  Refills needed-all meds including the new ones needed   Pt home from hosp-d/c from GI bleed. Just got home last night. Only took her bedtime meds last night, none today yet.   She reports that since she the hosp, she has been having shoulder pain to the rt side.  She reports pain in her hips also.  chart review she had this pain during hosp also.   She denies increased sob.  She has not got on scales since she has been home.     She denies any BM since the past couple days, her last BM was normal-no blood/black tarry stools noted.  Pt is bloated and has been having trouble pulling her diapers up due to the bloating.   Med changes at d/c-  Start amio-100mg  Colace  Diflucan   Folic acid   Bidil   Decrease torsemide  from 100 mg BID>>40mg  daily  Changed insulin  dose-   Due to her swelling and bloating and weight gain, I reached out to triage clinic.  She does have clinic f/u next week but with this change in torsemide  I think she will continue to gain and end back up in the hosp.   I reached out to pharm to see if he got the rx's of all the med changes. I reviewed with him all her meds and request we move back to bottles for now until things gets more steady since she was d/c. He will get those filled and I will p/u this afternoon and come back to place in pill box.    Her cardiomems had stopped working before her admission, she reached out to the company and they sent out another one for her. She sent out reading today during visit.   Meds verified and moved her bubble pack of meds to pill box again-   Missing amio, diflucan , folic acid , and bidil    BP (!) 142/60   Pulse 67   Resp 18   Wt 231 lb (104.8  kg)   LMP  (LMP Unknown)   SpO2 96%   BMI 40.92 kg/m  Weight yesterday-? Last visit weight-216  Patient Care Team: Maryellen Snare, NP as PCP - General Katheryne Pane Frederico Jan, MD as PCP - Cardiology (Cardiology) Darlis Eisenmenger, MD as PCP - Advanced Heart Failure (Cardiology) Lei Pump, MD as PCP - Electrophysiology (Cardiology) Avanell Leigh, MD as Consulting Physician (Cardiology) Diamond Formica, MD as Consulting Physician (Pulmonary Disease) System, Provider Not In  Patient Active Problem List   Diagnosis Date Noted   Persistent atrial fibrillation ([name]) 03/15/2024   Benign neoplasm of ascending colon 03/14/2024   Benign neoplasm of transverse colon 03/14/2024   Benign neoplasm of descending colon 03/14/2024   Benign neoplasm of sigmoid colon 03/14/2024   Chronic systolic congestive heart failure ([name]) 03/12/2024   Melena 03/12/2024   Heme positive stool 03/11/2024   GI bleed 03/08/2024   History of CAD (coronary artery disease) 03/08/2024   CKD (chronic kidney disease), stage IV ([name]) 03/08/2024   Continuous dependence on cigarette smoking 03/08/2024   GAD (generalized anxiety disorder) 03/08/2024   HFrEF (heart failure with reduced ejection fraction) ([name]) 10/11/2023   Acute on  chronic combined systolic (congestive) and diastolic (congestive) heart failure ([name]) 10/10/2023   Pre-ulcerative calluses 04/25/2022   PAF (paroxysmal atrial fibrillation) ([name]) 05/05/2021   COPD (chronic obstructive pulmonary disease) ([name]) 05/05/2021   OSA (obstructive sleep apnea) 05/05/2021   Stage 3b chronic kidney disease ([name]) 05/05/2021   Pressure injury of skin 05/04/2021   Diabetic nephropathy ([name]) 01/02/2021   Low back pain 06/01/2020   Hyperlipidemia 04/03/2020   Peripheral artery disease ([name]) 12/26/2019   NICM (nonischemic cardiomyopathy) ([name]) 06/20/2019   Non-healing ulcer ([name]) 06/20/2019   Coagulation disorder ([name]) 08/09/2017   Depression 07/21/2017   At risk  for adverse drug reaction 06/20/2017   Peripheral neuropathy 06/20/2017   S/P transmetatarsal amputation of foot, right ([name]) 06/05/2017   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) 05/19/2017   Obesity, class 2 02/24/2016   Anticoagulation management encounter 02/10/2016   Chronic sinus bradycardia 01/12/2016   Essential hypertension 12/22/2015   Demand ischemia ([name])    Acute on chronic diastolic CHF (congestive heart failure) ([name])    Symptomatic anemia 11/08/2015   Hypokalemia 11/08/2015   Tobacco abuse 10/23/2015   Coronary artery disease    DOE (dyspnea on exertion) 04/29/2015   Paroxysmal atrial fibrillation ([name]) 01/16/2015   Carotid artery stenosis 01/16/2015   Insomnia 02/03/2014   S/P peripheral artery angioplasty - TurboHawk atherectomy; R SFA 09/11/2013    Class: Acute   Leg pain, bilateral 08/19/2013   Hypothyroidism 07/31/2013   History of cocaine abuse ([name]) 06/13/2013   Long term current use of anticoagulant therapy 05/20/2013   Alcohol abuse    Narcotic abuse ([name])    Marijuana abuse    Alcoholic cirrhosis ([name])    Insulin  dependent type 2 diabetes mellitus ([name])     Current Outpatient Medications:    allopurinol  (ZYLOPRIM ) 100 MG tablet, Take 100 mg by mouth daily., Disp: , Rfl:    apixaban  (ELIQUIS ) 5 MG TABS tablet, Take 1 tablet (5 mg total) by mouth 2 (two) times daily., Disp: 60 tablet, Rfl: 6   buPROPion  (WELLBUTRIN  XL) 300 MG 24 hr tablet, Take 300 mg by mouth every morning., Disp: , Rfl:    gabapentin  (NEURONTIN ) 100 MG capsule, Take 1 capsule (100 mg total) by mouth 3 (three) times daily., Disp: 270 capsule, Rfl: 1   insulin  lispro (HUMALOG ) 100 UNIT/ML KwikPen, Inject 15 Units into the skin 3 (three) times daily with meals. Plus sliding scale, max dose 70 units /day, Disp: 25 mL, Rfl: 1   LANTUS  SOLOSTAR 100 UNIT/ML Solostar Pen, Inject 30 Units into the skin daily., Disp: 15 mL, Rfl: 0    levothyroxine  (SYNTHROID ) 50 MCG tablet, TAKE 1 TABLET (50 MCG TOTAL) BY MOUTH DAILY BEFORE BREAKFAST (AM), Disp: 30 tablet, Rfl: 0   metoprolol  succinate (TOPROL -XL) 25 MG 24 hr tablet, Take 1 tablet (25 mg total) by mouth daily., Disp: 90 tablet, Rfl: 3   pantoprazole  (PROTONIX ) 40 MG tablet, TAKE 1 TABLET (40 MG TOTAL) BY MOUTH DAILY(AM), Disp: 30 tablet, Rfl: 0   rosuvastatin  (CRESTOR ) 40 MG tablet, TAKE 1 TABLET (40 MG TOTAL) BY MOUTH DAILY (Patient taking differently: Take 40 mg by mouth at bedtime.), Disp: 15 tablet, Rfl: 0   tirzepatide (MOUNJARO) 7.5 MG/0.5ML Pen, Inject 7.5 mg into the skin once a week., Disp: 6 mL, Rfl: 0   torsemide  40 MG TABS, Take 40 mg by mouth daily., Disp: 30 tablet, Rfl: 0   VITAMIN E PO, Take 1 tablet by  mouth daily., Disp: , Rfl:    acetaminophen  (TYLENOL ) 500 MG tablet, Take 1,000 mg by mouth every 6 (six) hours as needed for headache (pain)., Disp: , Rfl:    albuterol  (VENTOLIN  HFA) 108 (90 Base) MCG/ACT inhaler, USE 2 PUFFS BY MOUTH EVERY FOUR HOURS, AS NEEDED, FOR COUGHING/WHEEZING, Disp: 8.5 g, Rfl: 0   amiodarone  (PACERONE ) 200 MG tablet, Take 1 tablet (200 mg total) by mouth daily., Disp: 30 tablet, Rfl: 1   budesonide -formoterol  (SYMBICORT ) 160-4.5 MCG/ACT inhaler, Inhale 2 puffs into the lungs 2 (two) times daily., Disp: 1 each, Rfl: 2   Cholecalciferol  (VITAMIN D3) 50 MCG (2000 UT) capsule, Take 1 capsule by mouth every morning., Disp: , Rfl:    colchicine  0.6 MG tablet, Take 1.2 mg by mouth See admin instructions. Take 1.2 mg for flair up and an hour later take 0.6 mg if needed, Disp: , Rfl:    docusate sodium  (COLACE) 100 MG capsule, Take 2 capsules (200 mg total) by mouth at bedtime., Disp: 10 capsule, Rfl: 0   erythromycin ophthalmic ointment, Place 1 Application into the right eye 2 (two) times daily., Disp: , Rfl:    fluconazole  (DIFLUCAN ) 100 MG tablet, Take 1 tablet (100 mg total) by mouth daily for 10 days., Disp: 10 tablet, Rfl: 0   fluticasone   (FLONASE) 50 MCG/ACT nasal spray, Place 1 spray into both nostrils as needed for allergies or rhinitis., Disp: , Rfl:    folic acid  (FOLVITE ) 1 MG tablet, Take 1 tablet (1 mg total) by mouth daily., Disp: 30 tablet, Rfl: 0   isosorbide -hydrALAZINE  (BIDIL) 20-37.5 MG tablet, Take 1 tablet by mouth 2 (two) times daily., Disp: 90 tablet, Rfl: 0   Menthol -Camphor (ICY HOT PRO NO MESS EX), Apply 1 Application topically 3 (three) times daily as needed (arms, neuropathy lower extrimity, gout in hand.)., Disp: , Rfl:    NYAMYC  powder, Apply 1 Application topically 2 (two) times daily., Disp: , Rfl:    nystatin  cream (MYCOSTATIN ), Apply 1 Application topically 2 (two) times daily as needed for dry skin., Disp: , Rfl:    Omega-3 Fatty Acids (FISH OIL PO), Take 1 tablet by mouth daily., Disp: , Rfl:    polyethylene glycol (MIRALAX  / GLYCOLAX ) 17 g packet, Take 17 g by mouth daily as needed for moderate constipation., Disp: 14 each, Rfl: 0   triamcinolone  ointment (KENALOG ) 0.1 %, Apply topically 2 (two) times daily. (Patient taking differently: Apply 1 Application topically 2 (two) times daily as needed (Skin irritation).), Disp: 454 g, Rfl: 0 Allergies  Allergen Reactions   Gabapentin  Nausea And Vomiting and Other (See Comments)    POSSIBLE SHAKING   Lyrica  [Pregabalin ] Other (See Comments)    Shaking       Social History   Socioeconomic History   Marital status: Single    Spouse name: Not on file   Number of children: 1   Years of education: 12   Highest education level: 12th grade  Occupational History   Occupation: disabled  Tobacco Use   Smoking status: Every Day    Current packs/day: 1.00    Average packs/day: 1 pack/day for 44.0 years (44.0 ttl pk-yrs)    Types: E-cigarettes, Cigarettes   Smokeless tobacco: Former    Types: Snuff  Vaping Use   Vaping status: Former   Devices: 11/26/2018 "stopped months ago"  Substance and Sexual Activity   Alcohol use: Not Currently    Comment:  occ   Drug use: Not Currently  Types: "Crack" cocaine, Marijuana, Oxycodone    Sexual activity: Not Currently  Other Topics Concern   Not on file  Social History Narrative   ** Merged History Encounter **       Lives in [place], in motel with sister.  They are looking to move but don't have a place to go yet.     Social Drivers of Health   Financial Resource Strain: Low Risk  (02/09/2023)   Overall Financial Resource Strain (CARDIA)    Difficulty of Paying Living Expenses: Not hard at all  Food Insecurity: No Food Insecurity (03/09/2024)   Hunger Vital Sign    Worried About Running Out of Food in the Last Year: Never true    Ran Out of Food in the Last Year: Never true  Transportation Needs: Unmet Transportation Needs (03/09/2024)   PRAPARE - Transportation    Lack of Transportation (Medical): Yes    Lack of Transportation (Non-Medical): Yes  Physical Activity: Inactive (02/09/2023)   Exercise Vital Sign    Days of Exercise per Week: 0 days    Minutes of Exercise per Session: 0 min  Stress: No Stress Concern Present (02/09/2023)   [employer] of Occupational Health - Occupational Stress Questionnaire    Feeling of Stress : Not at all  Social Connections: Unknown (08/21/2023)   Received from [facility]   Social Network    Social Network: Not on file  Intimate Partner Violence: Not At Risk (03/09/2024)   Humiliation, Afraid, Rape, and Kick questionnaire    Fear of Current or Ex-Partner: No    Emotionally Abused: No    Physically Abused: No    Sexually Abused: No    Physical Exam      Future Appointments  Date Time Provider Department Center  03/19/2024  3:15 PM Iver Marker, RD [facility] NDM  03/28/2024 12:00 PM [facility] PA/NP [facility] None  04/09/2024  3:00 PM [facility] H&V  04/09/2024  4:00 PM Carlene Che, MD [facility] H&V  04/10/2024  2:00 PM Jorge Newcomer, MD [facility] None       Alfonza Angry, Paramedic [phone] [facility]  Paramedic  03/18/24

## 2024-03-18 NOTE — Telephone Encounter (Signed)
 cardioMems 33 goal 20

## 2024-03-18 NOTE — Telephone Encounter (Signed)
 Can we get a cardiomems reading?

## 2024-03-18 NOTE — Telephone Encounter (Signed)
 LMOM

## 2024-03-18 NOTE — Telephone Encounter (Signed)
 PA diastolic on cardiomems is quite elevated. Increase torsemide  to 40 BID and start 40 mEq KCL daily. Can get BMET at f/u 05/15.

## 2024-03-18 NOTE — Telephone Encounter (Signed)
 Pt aware via [name] with para medicine

## 2024-03-18 NOTE — Progress Notes (Unsigned)
 Paramedicine Encounter    Patient ID: [name], female    DOB: [birth year], 62 y.o.   MRN: [id number]  Came out for med rec after pharm p/u of all her meds.  Moved all her meds back to bottles. The bubble packs are not working out right now with her frequent med changes again.   Meds verified and pill box refilled for a full week.      Patient Care Team: Maryellen Snare, NP as PCP - General Katheryne Pane Frederico Jan, MD as PCP - Cardiology (Cardiology) Darlis Eisenmenger, MD as PCP - Advanced Heart Failure (Cardiology) Lei Pump, MD as PCP - Electrophysiology (Cardiology) Avanell Leigh, MD as Consulting Physician (Cardiology) Diamond Formica, MD as Consulting Physician (Pulmonary Disease) System, Provider Not In  Patient Active Problem List   Diagnosis Date Noted   Persistent atrial fibrillation ([name]) 03/15/2024   Benign neoplasm of ascending colon 03/14/2024   Benign neoplasm of transverse colon 03/14/2024   Benign neoplasm of descending colon 03/14/2024   Benign neoplasm of sigmoid colon 03/14/2024   Chronic systolic congestive heart failure ([name]) 03/12/2024   Melena 03/12/2024   Heme positive stool 03/11/2024   GI bleed 03/08/2024   History of CAD (coronary artery disease) 03/08/2024   CKD (chronic kidney disease), stage IV ([name]) 03/08/2024   Continuous dependence on cigarette smoking 03/08/2024   GAD (generalized anxiety disorder) 03/08/2024   HFrEF (heart failure with reduced ejection fraction) ([name]) 10/11/2023   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) and diastolic (congestive) heart failure ([name]) 10/10/2023   Pre-ulcerative calluses 04/25/2022   PAF (paroxysmal atrial fibrillation) ([name]) 05/05/2021   COPD (chronic obstructive pulmonary disease) ([name]) 05/05/2021   OSA (obstructive sleep apnea) 05/05/2021   Stage 3b chronic kidney disease ([name]) 05/05/2021   Pressure injury of skin 05/04/2021   Diabetic nephropathy ([name]) 01/02/2021   Low back pain  06/01/2020   Hyperlipidemia 04/03/2020   Peripheral artery disease ([name]) 12/26/2019   NICM (nonischemic cardiomyopathy) ([name]) 06/20/2019   Non-healing ulcer ([name]) 06/20/2019   Coagulation disorder ([name]) 08/09/2017   Depression 07/21/2017   At risk for adverse drug reaction 06/20/2017   Peripheral neuropathy 06/20/2017   S/P transmetatarsal amputation of foot, right ([name]) 06/05/2017   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) 05/19/2017   Obesity, class 2 02/24/2016   Anticoagulation management encounter 02/10/2016   Chronic sinus bradycardia 01/12/2016   Essential hypertension 12/22/2015   Demand ischemia ([name])    Acute on chronic diastolic CHF (congestive heart failure) ([name])    Symptomatic anemia 11/08/2015   Hypokalemia 11/08/2015   Tobacco abuse 10/23/2015   Coronary artery disease    DOE (dyspnea on exertion) 04/29/2015   Paroxysmal atrial fibrillation ([name]) 01/16/2015   Carotid artery stenosis 01/16/2015   Insomnia 02/03/2014   S/P peripheral artery angioplasty - TurboHawk atherectomy; R SFA 09/11/2013    Class: Acute   Leg pain, bilateral 08/19/2013   Hypothyroidism 07/31/2013   History of cocaine abuse ([name]) 06/13/2013   Long term current use of anticoagulant therapy 05/20/2013   Alcohol abuse    Narcotic abuse ([name])    Marijuana abuse    Alcoholic cirrhosis ([name])    Insulin  dependent type 2 diabetes mellitus ([name])     Current Outpatient Medications:    acetaminophen  (TYLENOL ) 500 MG tablet, Take 1,000 mg by mouth every 6 (six) hours as needed for headache (pain)., Disp: , Rfl:    albuterol  (VENTOLIN  HFA) 108 (  90 Base) MCG/ACT inhaler, USE 2 PUFFS BY MOUTH EVERY FOUR HOURS, AS NEEDED, FOR COUGHING/WHEEZING, Disp: 8.5 g, Rfl: 0   allopurinol  (ZYLOPRIM ) 100 MG tablet, Take 100 mg by mouth daily., Disp: , Rfl:    amiodarone  (PACERONE ) 200 MG tablet, Take 1 tablet (200 mg total) by mouth daily., Disp: 30 tablet,  Rfl: 1   apixaban  (ELIQUIS ) 5 MG TABS tablet, Take 1 tablet (5 mg total) by mouth 2 (two) times daily., Disp: 60 tablet, Rfl: 6   budesonide -formoterol  (SYMBICORT ) 160-4.5 MCG/ACT inhaler, Inhale 2 puffs into the lungs 2 (two) times daily., Disp: 1 each, Rfl: 2   buPROPion  (WELLBUTRIN  XL) 300 MG 24 hr tablet, Take 300 mg by mouth every morning., Disp: , Rfl:    Cholecalciferol  (VITAMIN D3) 50 MCG (2000 UT) capsule, Take 1 capsule by mouth every morning., Disp: , Rfl:    colchicine  0.6 MG tablet, Take 1.2 mg by mouth See admin instructions. Take 1.2 mg for flair up and an hour later take 0.6 mg if needed, Disp: , Rfl:    docusate sodium  (COLACE) 100 MG capsule, Take 2 capsules (200 mg total) by mouth at bedtime., Disp: 10 capsule, Rfl: 0   erythromycin ophthalmic ointment, Place 1 Application into the right eye 2 (two) times daily., Disp: , Rfl:    fluconazole  (DIFLUCAN ) 100 MG tablet, Take 1 tablet (100 mg total) by mouth daily for 10 days., Disp: 10 tablet, Rfl: 0   fluticasone  (FLONASE) 50 MCG/ACT nasal spray, Place 1 spray into both nostrils as needed for allergies or rhinitis., Disp: , Rfl:    folic acid  (FOLVITE ) 1 MG tablet, Take 1 tablet (1 mg total) by mouth daily., Disp: 30 tablet, Rfl: 0   gabapentin  (NEURONTIN ) 100 MG capsule, Take 1 capsule (100 mg total) by mouth 3 (three) times daily., Disp: 270 capsule, Rfl: 1   insulin  lispro (HUMALOG ) 100 UNIT/ML KwikPen, Inject 15 Units into the skin 3 (three) times daily with meals. Plus sliding scale, max dose 70 units /day, Disp: 25 mL, Rfl: 1   isosorbide -hydrALAZINE  (BIDIL) 20-37.5 MG tablet, Take 1 tablet by mouth 2 (two) times daily., Disp: 90 tablet, Rfl: 0   LANTUS  SOLOSTAR 100 UNIT/ML Solostar Pen, Inject 30 Units into the skin daily., Disp: 15 mL, Rfl: 0   levothyroxine  (SYNTHROID ) 50 MCG tablet, TAKE 1 TABLET (50 MCG TOTAL) BY MOUTH DAILY BEFORE BREAKFAST (AM), Disp: 30 tablet, Rfl: 0   Menthol -Camphor (ICY HOT PRO NO MESS EX), Apply 1  Application topically 3 (three) times daily as needed (arms, neuropathy lower extrimity, gout in hand.)., Disp: , Rfl:    metoprolol  succinate (TOPROL -XL) 25 MG 24 hr tablet, Take 1 tablet (25 mg total) by mouth daily., Disp: 90 tablet, Rfl: 3   NYAMYC  powder, Apply 1 Application topically 2 (two) times daily., Disp: , Rfl:    nystatin  cream (MYCOSTATIN ), Apply 1 Application topically 2 (two) times daily as needed for dry skin., Disp: , Rfl:    Omega-3 Fatty Acids (FISH OIL PO), Take 1 tablet by mouth daily., Disp: , Rfl:    pantoprazole  (PROTONIX ) 40 MG tablet, TAKE 1 TABLET (40 MG TOTAL) BY MOUTH DAILY(AM), Disp: 30 tablet, Rfl: 0   polyethylene glycol (MIRALAX  / GLYCOLAX ) 17 g packet, Take 17 g by mouth daily as needed for moderate constipation., Disp: 14 each, Rfl: 0   potassium chloride  SA (KLOR-CON  M) 20 MEQ tablet, Take 2 tablets (40 mEq total) by mouth daily., Disp: 60 tablet, Rfl: 3  rosuvastatin  (CRESTOR ) 40 MG tablet, TAKE 1 TABLET (40 MG TOTAL) BY MOUTH DAILY (Patient taking differently: Take 40 mg by mouth at bedtime.), Disp: 15 tablet, Rfl: 0   tirzepatide (MOUNJARO) 7.5 MG/0.5ML Pen, Inject 7.5 mg into the skin once a week., Disp: 6 mL, Rfl: 0   torsemide  (DEMADEX ) 20 MG tablet, Take 2 tablets (40 mg total) by mouth 2 (two) times daily., Disp: 120 tablet, Rfl: 3   triamcinolone  ointment (KENALOG ) 0.1 %, Apply topically 2 (two) times daily. (Patient taking differently: Apply 1 Application topically 2 (two) times daily as needed (Skin irritation).), Disp: 454 g, Rfl: 0   VITAMIN E PO, Take 1 tablet by mouth daily., Disp: , Rfl:  Allergies  Allergen Reactions   Gabapentin  Nausea And Vomiting and Other (See Comments)    POSSIBLE SHAKING   Lyrica  [Pregabalin ] Other (See Comments)    Shaking       Social History   Socioeconomic History   Marital status: Single    Spouse name: Not on file   Number of children: 1   Years of education: 12   Highest education level: 12th grade   Occupational History   Occupation: disabled  Tobacco Use   Smoking status: Every Day    Current packs/day: 1.00    Average packs/day: 1 pack/day for 44.0 years (44.0 ttl pk-yrs)    Types: E-cigarettes, Cigarettes   Smokeless tobacco: Former    Types: Snuff  Vaping Use   Vaping status: Former   Devices: 11/26/2018 "stopped months ago"  Substance and Sexual Activity   Alcohol use: Not Currently    Comment: occ   Drug use: Not Currently    Types: "Crack" cocaine, Marijuana, Oxycodone    Sexual activity: Not Currently  Other Topics Concern   Not on file  Social History Narrative   ** Merged History Encounter **       Lives in [place], in motel with sister.  They are looking to move but don't have a place to go yet.     Social Drivers of Corporate investment banker Strain: Low Risk  (02/09/2023)   Overall Financial Resource Strain (CARDIA)    Difficulty of Paying Living Expenses: Not hard at all  Food Insecurity: No Food Insecurity (03/09/2024)   Hunger Vital Sign    Worried About Running Out of Food in the Last Year: Never true    Ran Out of Food in the Last Year: Never true  Transportation Needs: Unmet Transportation Needs (03/09/2024)   PRAPARE - Transportation    Lack of Transportation (Medical): Yes    Lack of Transportation (Non-Medical): Yes  Physical Activity: Inactive (02/09/2023)   Exercise Vital Sign    Days of Exercise per Week: 0 days    Minutes of Exercise per Session: 0 min  Stress: No Stress Concern Present (02/09/2023)   [employer] of Occupational Health - Occupational Stress Questionnaire    Feeling of Stress : Not at all  Social Connections: Unknown (08/21/2023)   Received from [facility]   Social Network    Social Network: Not on file  Intimate Partner Violence: Not At Risk (03/09/2024)   Humiliation, Afraid, Rape, and Kick questionnaire    Fear of Current or Ex-Partner: No    Emotionally Abused: No    Physically Abused: No    Sexually Abused: No     Physical Exam      Future Appointments  Date Time Provider Department Center  03/19/2024  3:15 PM Scotece, Trula Gable  B, RD [facility] NDM  03/28/2024 12:00 PM [facility] PA/NP [facility] None  04/09/2024  3:00 PM [facility] H[facility]V  04/09/2024  4:00 PM Carlene Che, MD [facility] H&V  04/10/2024  2:00 PM Jorge Newcomer, MD [facility] None       Alfonza Angry, Paramedic [phone] [name] Paramedic  03/18/24

## 2024-03-19 ENCOUNTER — Encounter: Attending: Student | Admitting: Skilled Nursing Facility1

## 2024-03-19 ENCOUNTER — Encounter: Payer: Self-pay | Admitting: Skilled Nursing Facility1

## 2024-03-19 DIAGNOSIS — E119 Type 2 diabetes mellitus without complications: Secondary | ICD-10-CM | POA: Diagnosis present

## 2024-03-19 DIAGNOSIS — N184 Chronic kidney disease, stage 4 (severe): Secondary | ICD-10-CM | POA: Insufficient documentation

## 2024-03-19 NOTE — Progress Notes (Signed)
 DM: Pt stated insulin  regimen  Lantus  58 units at bedtime, increase by 1 unit unless fasting blood sugar is in 120s Humalog  15 units tid 30 min before meals  Pt states she cannot read or write.  Pt arrives stating she is having neck pain from sleeping on it wrong.   Pt asks if she is supposed to give insulin  every time her CGM beeps: Dietitian advised NO, pt states she wants to give herself insulin  when her sugar is high to bring it down. Dietitian advised she will let her endocrinologist know she is interested in doing a insulin  correction dose.   Current blood sugar reading 198; average glucose for 14 days 191 Pt state she has had diabetes about 12 years.   A1C 12.6  Pt states her sister typically makes her meals but has not lately stating her sister has memory issues. Pt states she cannot stand for long periods of time so she cannot make her own meals  Pt state she will make her own smoothies.   Pt states she always changes her needles and changes her insulin  injection sites.   Pt states she is not thinking straight currently. Pt states she sleeps in a recliner. Pt states she likes a lot of chocolate candy. Pt states she has dentures but they do not fit well.   Pts living situation is not ideal.   All education materials given are pictographs.   24 hr recall:  Grits or smoothie leftovers 8pm: tomato sandwich + kool aid  Beverages: kool aid, unsweet tea, coffee + creamer + sweet n low, water , smoothie: fruit, milk, boost  Diabetes Self-Management Education  Visit Type: First/Initial  Appt. Start Time: 3:22 Appt. End Time: 4:15  03/19/2024  [name], identified by name and date of birth, is a 63 y.o. female with a diagnosis of Diabetes: Type 2.   ASSESSMENT  There were no vitals taken for this visit. There is no height or weight on file to calculate BMI.   Diabetes Self-Management Education - 03/19/24 1616       Visit Information   Visit Type First/Initial       Initial Visit   Diabetes Type Type 2    Are you currently following a meal plan? No    Are you taking your medications as prescribed? Yes      Health Coping   How would you rate your overall health? Fair      Psychosocial Assessment   Patient Belief/Attitude about Diabetes Defeat/Burnout    What is the hardest part about your diabetes right now, causing you the most concern, or is the most worrisome to you about your diabetes?   Making healty food and beverage choices    Self-care barriers Low literacy;Lack of transportation;Debilitated state due to current medical condition    Self-management support Family    Patient Concerns Nutrition/Meal planning;Glycemic Control    Special Needs Simplified materials    Preferred Learning Style Hands on    Learning Readiness Contemplating    How often do you need to have someone help you when you read instructions, pamphlets, or other written materials from your doctor or pharmacy? 5 - Always      Pre-Education Assessment   Patient understands the diabetes disease and treatment process. Needs Instruction    Patient understands incorporating nutritional management into lifestyle. Needs Instruction    Patient undertands incorporating physical activity into lifestyle. Needs Instruction    Patient understands using medications safely. Needs Instruction  Patient understands monitoring blood glucose, interpreting and using results Needs Instruction    Patient understands prevention, detection, and treatment of acute complications. Needs Instruction    Patient understands prevention, detection, and treatment of chronic complications. Needs Instruction    Patient understands how to develop strategies to address psychosocial issues. Needs Instruction    Patient understands how to develop strategies to promote health/change behavior. Needs Instruction      Complications   Last HgB A1C per patient/outside source 12.6 %    How often do you check your  blood sugar? 3-4 times/day    Fasting Blood glucose range (mg/dL) >161    Postprandial Blood glucose range (mg/dL) >096    Number of hypoglycemic episodes per month 0      Activity / Exercise   Activity / Exercise Type ADL's    How many days per week do you exercise? 0    How many minutes per day do you exercise? 0    Total minutes per week of exercise 0      Patient Education   Previous Diabetes Education No    Medications Taught/reviewed insulin /injectables, injection, site rotation, insulin /injectables storage and needle disposal.;Reviewed patients medication for diabetes, action, purpose, timing of dose and side effects.;Reviewed medication adjustment guidelines for hyperglycemia and sick days.    Monitoring Taught/evaluated CGM (comment);Daily foot exams;Yearly dilated eye exam;Identified appropriate SMBG and/or A1C goals.    Chronic complications Dental care;Retinopathy and reason for yearly dilated eye exams;Nephropathy, what it is, prevention of, the use of ACE, ARB's and early detection of through urine microalbumia.;Lipid levels, blood glucose control and heart disease;Assessed and discussed foot care and prevention of foot problems;Relationship between chronic complications and blood glucose control    Lifestyle and Health Coping Lifestyle issues that need to be addressed for better diabetes care      Individualized Goals (developed by patient)   Nutrition Follow meal plan discussed;General guidelines for healthy choices and portions discussed    Medications take my medication as prescribed    Monitoring  Test my blood glucose as discussed;Test blood glucose pre and post meals as discussed    Problem Solving Eating Pattern    Reducing Risk do foot checks daily;treat hypoglycemia with 15 grams of carbs if blood glucose less than 70mg /dL      Post-Education Assessment   Patient understands the diabetes disease and treatment process. Comprehends key points    Patient understands  incorporating nutritional management into lifestyle. Comprehends key points    Patient undertands incorporating physical activity into lifestyle. Comprehends key points    Patient understands using medications safely. Comphrehends key points    Patient understands monitoring blood glucose, interpreting and using results Comprehends key points    Patient understands prevention, detection, and treatment of acute complications. Comprehends key points    Patient understands prevention, detection, and treatment of chronic complications. Comprehends key points    Patient understands how to develop strategies to address psychosocial issues. Comprehends key points    Patient understands how to develop strategies to promote health/change behavior. Comprehends key points      Outcomes   Expected Outcomes Demonstrated interest in learning but significant barriers to change    Future DMSE 4-6 wks    Program Status Completed             Individualized Plan for Diabetes Self-Management Training:   Learning Objective:  Patient will have a greater understanding of diabetes self-management. Patient education plan is to attend individual and/or group  sessions per assessed needs and concerns.    Expected Outcomes:  Demonstrated interest in learning but significant barriers to change  Education material provided: My Plate  If problems or questions, patient to contact team via:  Phone   Future DSME appointment: 4-6 wks

## 2024-03-21 ENCOUNTER — Other Ambulatory Visit (HOSPITAL_COMMUNITY): Payer: Self-pay

## 2024-03-21 ENCOUNTER — Telehealth (HOSPITAL_COMMUNITY): Payer: Self-pay | Admitting: Adult Health

## 2024-03-21 MED ORDER — TORSEMIDE 20 MG PO TABS: 80.0000 mg | ORAL_TABLET | Freq: Two times a day (BID) | ORAL | 3 refills | Status: DC

## 2024-03-21 MED ORDER — POTASSIUM CHLORIDE CRYS ER 20 MEQ PO TBCR
EXTENDED_RELEASE_TABLET | ORAL | 3 refills | Status: DC
Start: 2024-03-21 — End: 2024-04-22

## 2024-03-21 NOTE — Telephone Encounter (Signed)
  Cardiomems Remote Monitoring  S/P Cardiomems Implant   PAD Goal: 20 Most recent reading: 32  Recommended changes: Increase torsemide  to 80 mg twice a day and add 20 meq K Dur  I continue to review and analyze the patients PA pressures weekly (and more often as needed) to bring PA pressures within the optimal range.     I personally called Alfonza Angry Paramedic. I updated medication list.  Follow up in HF clinic next week.  Kieu Quiggle NP-C  4:24 PM

## 2024-03-21 NOTE — Progress Notes (Signed)
 Paramedicine Encounter    Patient ID: [name], female    DOB: May 22, 1961, 63 y.o.   MRN: [id number]   Per amy she needs to have her torsemide  increased due to cardiomems still elevated.   Increase torsemide  to 80mg  BID and add 20meq of K in pm in addition to the 40meq she takes now.  That med change was made to her pill box.     Patient Care Team: Maryellen Snare, NP as PCP - General Katheryne Pane Frederico Jan, MD as PCP - Cardiology (Cardiology) Darlis Eisenmenger, MD as PCP - Advanced Heart Failure (Cardiology) Lei Pump, MD as PCP - Electrophysiology (Cardiology) Avanell Leigh, MD as Consulting Physician (Cardiology) Diamond Formica, MD as Consulting Physician (Pulmonary Disease) System, Provider Not In  Patient Active Problem List   Diagnosis Date Noted   Persistent atrial fibrillation ([name]) 03/15/2024   Benign neoplasm of ascending colon 03/14/2024   Benign neoplasm of transverse colon 03/14/2024   Benign neoplasm of descending colon 03/14/2024   Benign neoplasm of sigmoid colon 03/14/2024   Chronic systolic congestive heart failure ([name]) 03/12/2024   Melena 03/12/2024   Heme positive stool 03/11/2024   GI bleed 03/08/2024   History of CAD (coronary artery disease) 03/08/2024   CKD (chronic kidney disease), stage IV ([name]) 03/08/2024   Continuous dependence on cigarette smoking 03/08/2024   GAD (generalized anxiety disorder) 03/08/2024   HFrEF (heart failure with reduced ejection fraction) ([name]) 10/11/2023   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) and diastolic (congestive) heart failure ([name]) 10/10/2023   Pre-ulcerative calluses 04/25/2022   PAF (paroxysmal atrial fibrillation) ([name]) 05/05/2021   COPD (chronic obstructive pulmonary disease) ([name]) 05/05/2021   OSA (obstructive sleep apnea) 05/05/2021   Stage 3b chronic kidney disease ([name]) 05/05/2021   Pressure injury of skin 05/04/2021   Diabetic nephropathy ([name]) 01/02/2021   Low back pain 06/01/2020    Hyperlipidemia 04/03/2020   Peripheral artery disease ([name]) 12/26/2019   NICM (nonischemic cardiomyopathy) ([name]) 06/20/2019   Non-healing ulcer ([name]) 06/20/2019   Coagulation disorder ([name]) 08/09/2017   Depression 07/21/2017   At risk for adverse drug reaction 06/20/2017   Peripheral neuropathy 06/20/2017   S/P transmetatarsal amputation of foot, right ([name]) 06/05/2017   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) 05/19/2017   Obesity, class 2 02/24/2016   Anticoagulation management encounter 02/10/2016   Chronic sinus bradycardia 01/12/2016   Essential hypertension 12/22/2015   Demand ischemia ([name])    Acute on chronic diastolic CHF (congestive heart failure) ([name])    Symptomatic anemia 11/08/2015   Hypokalemia 11/08/2015   Tobacco abuse 10/23/2015   Coronary artery disease    DOE (dyspnea on exertion) 04/29/2015   Paroxysmal atrial fibrillation ([name]) 01/16/2015   Carotid artery stenosis 01/16/2015   Insomnia 02/03/2014   S/P peripheral artery angioplasty - TurboHawk atherectomy; R SFA 09/11/2013    Class: Acute   Leg pain, bilateral 08/19/2013   Hypothyroidism 07/31/2013   History of cocaine abuse ([name]) 06/13/2013   Long term current use of anticoagulant therapy 05/20/2013   Alcohol abuse    Narcotic abuse ([name])    Marijuana abuse    Alcoholic cirrhosis ([name])    Insulin  dependent type 2 diabetes mellitus ([name])     Current Outpatient Medications:    acetaminophen  (TYLENOL ) 500 MG tablet, Take 1,000 mg by mouth every 6 (six) hours as needed for headache (pain)., Disp: , Rfl:    albuterol  (VENTOLIN  HFA) 108 (90  Base) MCG/ACT inhaler, USE 2 PUFFS BY MOUTH EVERY FOUR HOURS, AS NEEDED, FOR COUGHING/WHEEZING, Disp: 8.5 g, Rfl: 0   allopurinol  (ZYLOPRIM ) 100 MG tablet, Take 100 mg by mouth daily., Disp: , Rfl:    amiodarone  (PACERONE ) 200 MG tablet, Take 1 tablet (200 mg total) by mouth daily., Disp: 30 tablet, Rfl: 1    apixaban  (ELIQUIS ) 5 MG TABS tablet, Take 1 tablet (5 mg total) by mouth 2 (two) times daily., Disp: 60 tablet, Rfl: 6   budesonide -formoterol  (SYMBICORT ) 160-4.5 MCG/ACT inhaler, Inhale 2 puffs into the lungs 2 (two) times daily., Disp: 1 each, Rfl: 2   buPROPion  (WELLBUTRIN  XL) 300 MG 24 hr tablet, Take 300 mg by mouth every morning., Disp: , Rfl:    Cholecalciferol  (VITAMIN D3) 50 MCG (2000 UT) capsule, Take 1 capsule by mouth every morning., Disp: , Rfl:    colchicine  0.6 MG tablet, Take 1.2 mg by mouth See admin instructions. Take 1.2 mg for flair up and an hour later take 0.6 mg if needed, Disp: , Rfl:    docusate sodium  (COLACE) 100 MG capsule, Take 2 capsules (200 mg total) by mouth at bedtime., Disp: 10 capsule, Rfl: 0   erythromycin ophthalmic ointment, Place 1 Application into the right eye 2 (two) times daily., Disp: , Rfl:    fluconazole  (DIFLUCAN ) 100 MG tablet, Take 1 tablet (100 mg total) by mouth daily for 10 days., Disp: 10 tablet, Rfl: 0   fluticasone  (FLONASE) 50 MCG/ACT nasal spray, Place 1 spray into both nostrils as needed for allergies or rhinitis., Disp: , Rfl:    folic acid  (FOLVITE ) 1 MG tablet, Take 1 tablet (1 mg total) by mouth daily., Disp: 30 tablet, Rfl: 0   gabapentin  (NEURONTIN ) 100 MG capsule, Take 1 capsule (100 mg total) by mouth 3 (three) times daily., Disp: 270 capsule, Rfl: 1   insulin  lispro (HUMALOG ) 100 UNIT/ML KwikPen, Inject 15 Units into the skin 3 (three) times daily with meals. Plus sliding scale, max dose 70 units /day, Disp: 25 mL, Rfl: 1   isosorbide -hydrALAZINE  (BIDIL) 20-37.5 MG tablet, Take 1 tablet by mouth 2 (two) times daily., Disp: 90 tablet, Rfl: 0   LANTUS  SOLOSTAR 100 UNIT/ML Solostar Pen, Inject 30 Units into the skin daily., Disp: 15 mL, Rfl: 0   levothyroxine  (SYNTHROID ) 50 MCG tablet, TAKE 1 TABLET (50 MCG TOTAL) BY MOUTH DAILY BEFORE BREAKFAST (AM), Disp: 30 tablet, Rfl: 0   Menthol -Camphor (ICY HOT PRO NO MESS EX), Apply 1 Application  topically 3 (three) times daily as needed (arms, neuropathy lower extrimity, gout in hand.)., Disp: , Rfl:    metoprolol  succinate (TOPROL -XL) 25 MG 24 hr tablet, Take 1 tablet (25 mg total) by mouth daily., Disp: 90 tablet, Rfl: 3   NYAMYC  powder, Apply 1 Application topically 2 (two) times daily., Disp: , Rfl:    nystatin  cream (MYCOSTATIN ), Apply 1 Application topically 2 (two) times daily as needed for dry skin., Disp: , Rfl:    Omega-3 Fatty Acids (FISH OIL PO), Take 1 tablet by mouth daily., Disp: , Rfl:    pantoprazole  (PROTONIX ) 40 MG tablet, TAKE 1 TABLET (40 MG TOTAL) BY MOUTH DAILY(AM), Disp: 30 tablet, Rfl: 0   polyethylene glycol (MIRALAX  / GLYCOLAX ) 17 g packet, Take 17 g by mouth daily as needed for moderate constipation., Disp: 14 each, Rfl: 0   potassium chloride  SA (KLOR-CON  M) 20 MEQ tablet, Take 40meq in am 20 meq in pm, Disp: 90 tablet, Rfl: 3   rosuvastatin  (  CRESTOR ) 40 MG tablet, TAKE 1 TABLET (40 MG TOTAL) BY MOUTH DAILY (Patient taking differently: Take 40 mg by mouth at bedtime.), Disp: 15 tablet, Rfl: 0   tirzepatide (MOUNJARO) 7.5 MG/0.5ML Pen, Inject 7.5 mg into the skin once a week., Disp: 6 mL, Rfl: 0   torsemide  (DEMADEX ) 20 MG tablet, Take 4 tablets (80 mg total) by mouth 2 (two) times daily., Disp: 120 tablet, Rfl: 3   triamcinolone  ointment (KENALOG ) 0.1 %, Apply topically 2 (two) times daily. (Patient taking differently: Apply 1 Application topically 2 (two) times daily as needed (Skin irritation).), Disp: 454 g, Rfl: 0   VITAMIN E PO, Take 1 tablet by mouth daily., Disp: , Rfl:  Allergies  Allergen Reactions   Gabapentin  Nausea And Vomiting and Other (See Comments)    POSSIBLE SHAKING   Lyrica  [Pregabalin ] Other (See Comments)    Shaking       Social History   Socioeconomic History   Marital status: Single    Spouse name: Not on file   Number of children: 1   Years of education: 46   Highest education level: 12th grade  Occupational History    Occupation: disabled  Tobacco Use   Smoking status: Every Day    Current packs/day: 1.00    Average packs/day: 1 pack/day for 44.0 years (44.0 ttl pk-yrs)    Types: E-cigarettes, Cigarettes   Smokeless tobacco: Former    Types: Snuff  Vaping Use   Vaping status: Former   Devices: 11/26/2018 "stopped months ago"  Substance and Sexual Activity   Alcohol use: Not Currently    Comment: occ   Drug use: Not Currently    Types: "Crack" cocaine, Marijuana, Oxycodone    Sexual activity: Not Currently  Other Topics Concern   Not on file  Social History Narrative   ** Merged History Encounter **       Lives in [place], in motel with sister.  They are looking to move but don't have a place to go yet.     Social Drivers of Health   Financial Resource Strain: Low Risk  (02/09/2023)   Overall Financial Resource Strain (CARDIA)    Difficulty of Paying Living Expenses: Not hard at all  Food Insecurity: No Food Insecurity (03/09/2024)   Hunger Vital Sign    Worried About Running Out of Food in the Last Year: Never true    Ran Out of Food in the Last Year: Never true  Transportation Needs: Unmet Transportation Needs (03/09/2024)   PRAPARE - Transportation    Lack of Transportation (Medical): Yes    Lack of Transportation (Non-Medical): Yes  Physical Activity: Inactive (02/09/2023)   Exercise Vital Sign    Days of Exercise per Week: 0 days    Minutes of Exercise per Session: 0 min  Stress: No Stress Concern Present (02/09/2023)   [employer] of Occupational Health - Occupational Stress Questionnaire    Feeling of Stress : Not at all  Social Connections: Unknown (08/21/2023)   Received from [facility]   Social Network    Social Network: Not on file  Intimate Partner Violence: Not At Risk (03/09/2024)   Humiliation, Afraid, Rape, and Kick questionnaire    Fear of Current or Ex-Partner: No    Emotionally Abused: No    Physically Abused: No    Sexually Abused: No    Physical  Exam      Future Appointments  Date Time Provider Department Center  03/28/2024 12:00 PM [facility] PA/NP [facility] None  04/09/2024  3:00 PM [facility]V  04/09/2024  4:00 PM Carlene Che, MD [facility] H[facility]V  04/10/2024  2:00 PM Jorge Newcomer, MD [facility] None  05/07/2024  3:15 PM Scotece, Leretha Rankin, RD [facility] NDM       Alfonza Angry, Paramedic [phone] [facility] Paramedic  03/21/24

## 2024-03-21 NOTE — Progress Notes (Signed)
 Paramedicine Encounter    Patient ID: [name], female    DOB: [birth year], 62 y.o.   MRN: [id number]  Came out today for med rec. Went by pharm to p/u pantoprazole . Placed in pill box where needed.  Weight is coming down from the other day. But still more fluid needs to come off. She reports she is urinating A LOT.  She has clinic appoint next week.  Will see her again on Monday.    Weight the other day-231 Weight today- 225   Patient Care Team: Maryellen Snare, NP as PCP - General Katheryne Pane Frederico Jan, MD as PCP - Cardiology (Cardiology) Darlis Eisenmenger, MD as PCP - Advanced Heart Failure (Cardiology) Lei Pump, MD as PCP - Electrophysiology (Cardiology) Avanell Leigh, MD as Consulting Physician (Cardiology) Diamond Formica, MD as Consulting Physician (Pulmonary Disease) System, Provider Not In  Patient Active Problem List   Diagnosis Date Noted   Persistent atrial fibrillation ([name]) 03/15/2024   Benign neoplasm of ascending colon 03/14/2024   Benign neoplasm of transverse colon 03/14/2024   Benign neoplasm of descending colon 03/14/2024   Benign neoplasm of sigmoid colon 03/14/2024   Chronic systolic congestive heart failure ([name]) 03/12/2024   Melena 03/12/2024   Heme positive stool 03/11/2024   GI bleed 03/08/2024   History of CAD (coronary artery disease) 03/08/2024   CKD (chronic kidney disease), stage IV ([name]) 03/08/2024   Continuous dependence on cigarette smoking 03/08/2024   GAD (generalized anxiety disorder) 03/08/2024   HFrEF (heart failure with reduced ejection fraction) ([name]) 10/11/2023   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) and diastolic (congestive) heart failure ([name]) 10/10/2023   Pre-ulcerative calluses 04/25/2022   PAF (paroxysmal atrial fibrillation) ([name]) 05/05/2021   COPD (chronic obstructive pulmonary disease) ([name]) 05/05/2021   OSA (obstructive sleep apnea) 05/05/2021   Stage 3b chronic kidney disease ([name]) 05/05/2021    Pressure injury of skin 05/04/2021   Diabetic nephropathy ([name]) 01/02/2021   Low back pain 06/01/2020   Hyperlipidemia 04/03/2020   Peripheral artery disease ([name]) 12/26/2019   NICM (nonischemic cardiomyopathy) ([name]) 06/20/2019   Non-healing ulcer ([name]) 06/20/2019   Coagulation disorder ([name]) 08/09/2017   Depression 07/21/2017   At risk for adverse drug reaction 06/20/2017   Peripheral neuropathy 06/20/2017   S/P transmetatarsal amputation of foot, right ([name]) 06/05/2017   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) 05/19/2017   Obesity, class 2 02/24/2016   Anticoagulation management encounter 02/10/2016   Chronic sinus bradycardia 01/12/2016   Essential hypertension 12/22/2015   Demand ischemia ([name])    Acute on chronic diastolic CHF (congestive heart failure) ([name])    Symptomatic anemia 11/08/2015   Hypokalemia 11/08/2015   Tobacco abuse 10/23/2015   Coronary artery disease    DOE (dyspnea on exertion) 04/29/2015   Paroxysmal atrial fibrillation ([name]) 01/16/2015   Carotid artery stenosis 01/16/2015   Insomnia 02/03/2014   S/P peripheral artery angioplasty - TurboHawk atherectomy; R SFA 09/11/2013    Class: Acute   Leg pain, bilateral 08/19/2013   Hypothyroidism 07/31/2013   History of cocaine abuse ([name]) 06/13/2013   Long term current use of anticoagulant therapy 05/20/2013   Alcohol abuse    Narcotic abuse ([name])    Marijuana abuse    Alcoholic cirrhosis ([name])    Insulin  dependent type 2 diabetes mellitus ([name])     Current Outpatient Medications:    acetaminophen  (TYLENOL ) 500 MG tablet, Take 1,000 mg by mouth every 6 (six)  hours as needed for headache (pain)., Disp: , Rfl:    albuterol  (VENTOLIN  HFA) 108 (90 Base) MCG/ACT inhaler, USE 2 PUFFS BY MOUTH EVERY FOUR HOURS, AS NEEDED, FOR COUGHING/WHEEZING, Disp: 8.5 g, Rfl: 0   allopurinol  (ZYLOPRIM ) 100 MG tablet, Take 100 mg by mouth daily., Disp: , Rfl:     amiodarone  (PACERONE ) 200 MG tablet, Take 1 tablet (200 mg total) by mouth daily., Disp: 30 tablet, Rfl: 1   apixaban  (ELIQUIS ) 5 MG TABS tablet, Take 1 tablet (5 mg total) by mouth 2 (two) times daily., Disp: 60 tablet, Rfl: 6   budesonide -formoterol  (SYMBICORT ) 160-4.5 MCG/ACT inhaler, Inhale 2 puffs into the lungs 2 (two) times daily., Disp: 1 each, Rfl: 2   buPROPion  (WELLBUTRIN  XL) 300 MG 24 hr tablet, Take 300 mg by mouth every morning., Disp: , Rfl:    Cholecalciferol  (VITAMIN D3) 50 MCG (2000 UT) capsule, Take 1 capsule by mouth every morning., Disp: , Rfl:    colchicine  0.6 MG tablet, Take 1.2 mg by mouth See admin instructions. Take 1.2 mg for flair up and an hour later take 0.6 mg if needed, Disp: , Rfl:    docusate sodium  (COLACE) 100 MG capsule, Take 2 capsules (200 mg total) by mouth at bedtime., Disp: 10 capsule, Rfl: 0   erythromycin ophthalmic ointment, Place 1 Application into the right eye 2 (two) times daily., Disp: , Rfl:    fluconazole  (DIFLUCAN ) 100 MG tablet, Take 1 tablet (100 mg total) by mouth daily for 10 days., Disp: 10 tablet, Rfl: 0   fluticasone  (FLONASE) 50 MCG/ACT nasal spray, Place 1 spray into both nostrils as needed for allergies or rhinitis., Disp: , Rfl:    folic acid  (FOLVITE ) 1 MG tablet, Take 1 tablet (1 mg total) by mouth daily., Disp: 30 tablet, Rfl: 0   gabapentin  (NEURONTIN ) 100 MG capsule, Take 1 capsule (100 mg total) by mouth 3 (three) times daily., Disp: 270 capsule, Rfl: 1   insulin  lispro (HUMALOG ) 100 UNIT/ML KwikPen, Inject 15 Units into the skin 3 (three) times daily with meals. Plus sliding scale, max dose 70 units /day, Disp: 25 mL, Rfl: 1   isosorbide -hydrALAZINE  (BIDIL) 20-37.5 MG tablet, Take 1 tablet by mouth 2 (two) times daily., Disp: 90 tablet, Rfl: 0   LANTUS  SOLOSTAR 100 UNIT/ML Solostar Pen, Inject 30 Units into the skin daily., Disp: 15 mL, Rfl: 0   levothyroxine  (SYNTHROID ) 50 MCG tablet, TAKE 1 TABLET (50 MCG TOTAL) BY MOUTH DAILY  BEFORE BREAKFAST (AM), Disp: 30 tablet, Rfl: 0   Menthol -Camphor (ICY HOT PRO NO MESS EX), Apply 1 Application topically 3 (three) times daily as needed (arms, neuropathy lower extrimity, gout in hand.)., Disp: , Rfl:    metoprolol  succinate (TOPROL -XL) 25 MG 24 hr tablet, Take 1 tablet (25 mg total) by mouth daily., Disp: 90 tablet, Rfl: 3   NYAMYC  powder, Apply 1 Application topically 2 (two) times daily., Disp: , Rfl:    nystatin  cream (MYCOSTATIN ), Apply 1 Application topically 2 (two) times daily as needed for dry skin., Disp: , Rfl:    Omega-3 Fatty Acids (FISH OIL PO), Take 1 tablet by mouth daily., Disp: , Rfl:    pantoprazole  (PROTONIX ) 40 MG tablet, TAKE 1 TABLET (40 MG TOTAL) BY MOUTH DAILY(AM), Disp: 30 tablet, Rfl: 0   polyethylene glycol (MIRALAX  / GLYCOLAX ) 17 g packet, Take 17 g by mouth daily as needed for moderate constipation., Disp: 14 each, Rfl: 0   potassium chloride  SA (KLOR-CON  M) 20 MEQ  tablet, Take 2 tablets (40 mEq total) by mouth daily., Disp: 60 tablet, Rfl: 3   rosuvastatin  (CRESTOR ) 40 MG tablet, TAKE 1 TABLET (40 MG TOTAL) BY MOUTH DAILY (Patient taking differently: Take 40 mg by mouth at bedtime.), Disp: 15 tablet, Rfl: 0   tirzepatide (MOUNJARO) 7.5 MG/0.5ML Pen, Inject 7.5 mg into the skin once a week., Disp: 6 mL, Rfl: 0   torsemide  (DEMADEX ) 20 MG tablet, Take 2 tablets (40 mg total) by mouth 2 (two) times daily., Disp: 120 tablet, Rfl: 3   triamcinolone  ointment (KENALOG ) 0.1 %, Apply topically 2 (two) times daily. (Patient taking differently: Apply 1 Application topically 2 (two) times daily as needed (Skin irritation).), Disp: 454 g, Rfl: 0   VITAMIN E PO, Take 1 tablet by mouth daily., Disp: , Rfl:  Allergies  Allergen Reactions   Gabapentin  Nausea And Vomiting and Other (See Comments)    POSSIBLE SHAKING   Lyrica  [Pregabalin ] Other (See Comments)    Shaking       Social History   Socioeconomic History   Marital status: Single    Spouse name: Not  on file   Number of children: 1   Years of education: 12   Highest education level: 12th grade  Occupational History   Occupation: disabled  Tobacco Use   Smoking status: Every Day    Current packs/day: 1.00    Average packs/day: 1 pack/day for 44.0 years (44.0 ttl pk-yrs)    Types: E-cigarettes, Cigarettes   Smokeless tobacco: Former    Types: Snuff  Vaping Use   Vaping status: Former   Devices: 11/26/2018 "stopped months ago"  Substance and Sexual Activity   Alcohol use: Not Currently    Comment: occ   Drug use: Not Currently    Types: "Crack" cocaine, Marijuana, Oxycodone    Sexual activity: Not Currently  Other Topics Concern   Not on file  Social History Narrative   ** Merged History Encounter **       Lives in [place], in motel with sister.  They are looking to move but don't have a place to go yet.     Social Drivers of Corporate investment banker Strain: Low Risk  (02/09/2023)   Overall Financial Resource Strain (CARDIA)    Difficulty of Paying Living Expenses: Not hard at all  Food Insecurity: No Food Insecurity (03/09/2024)   Hunger Vital Sign    Worried About Running Out of Food in the Last Year: Never true    Ran Out of Food in the Last Year: Never true  Transportation Needs: Unmet Transportation Needs (03/09/2024)   PRAPARE - Transportation    Lack of Transportation (Medical): Yes    Lack of Transportation (Non-Medical): Yes  Physical Activity: Inactive (02/09/2023)   Exercise Vital Sign    Days of Exercise per Week: 0 days    Minutes of Exercise per Session: 0 min  Stress: No Stress Concern Present (02/09/2023)   [employer] of Occupational Health - Occupational Stress Questionnaire    Feeling of Stress : Not at all  Social Connections: Unknown (08/21/2023)   Received from [facility]   Social Network    Social Network: Not on file  Intimate Partner Violence: Not At Risk (03/09/2024)   Humiliation, Afraid, Rape, and Kick questionnaire    Fear of Current  or Ex-Partner: No    Emotionally Abused: No    Physically Abused: No    Sexually Abused: No    Physical Exam  Future Appointments  Date Time Provider Department Center  03/28/2024 12:00 PM [facility] PA/NP [facility] None  04/09/2024  3:00 PM [facility] 2 [facility] H&V  04/09/2024  4:00 PM Carlene Che, MD [facility] H&V  04/10/2024  2:00 PM Jorge Newcomer, MD [facility] None  05/07/2024  3:15 PM Scotece, Leretha Rankin, RD [facility] NDM       Alfonza Angry, Paramedic [phone] [facility] Paramedic  03/21/24

## 2024-03-25 ENCOUNTER — Other Ambulatory Visit: Payer: Self-pay | Admitting: Student

## 2024-03-25 ENCOUNTER — Other Ambulatory Visit (HOSPITAL_COMMUNITY): Payer: Self-pay

## 2024-03-25 DIAGNOSIS — K7469 Other cirrhosis of liver: Secondary | ICD-10-CM

## 2024-03-25 NOTE — Progress Notes (Signed)
 Paramedicine Encounter    Patient ID: [name], female    DOB: January 14, 1961, 63 y.o.   MRN: [id number]   Complaints-none  Edema-yes   Compliance with meds-yes  Pill box filled-yes If so, by whom-paramedic  Refills needed-gabapentin , rosuvastatin , potassium   Pt reports she is doing ok. Her leg pain is much improved sine starting the gabapentin .  Her breathing is doing good. Her weight is coming down from 231>>217 so much improved.   Pt denies c/p, no dizziness, no bleeding issues or black stools.  Pt taking all meds.  Still edema noted but much better than last week.  Appetite ok.   Meds verified and pill box refilled.   She went to PCP last week and they told her to take ice hot for the joint pain and told her to take melatonin 10mg .  Pharm has a note for that but it is not in rx form, just OTC. Advised her of this as well.   CBG PTA- 132 BP 110/60   Pulse 64   Resp 18   Wt 217 lb (98.4 kg)   LMP  (LMP Unknown)   SpO2 98%   BMI 38.44 kg/m  Weight yesterday-? Last visit weight-231  Patient Care Team: Maryellen Snare, NP as PCP - General Katheryne Pane Frederico Jan, MD as PCP - Cardiology (Cardiology) Darlis Eisenmenger, MD as PCP - Advanced Heart Failure (Cardiology) Lei Pump, MD as PCP - Electrophysiology (Cardiology) Avanell Leigh, MD as Consulting Physician (Cardiology) Diamond Formica, MD as Consulting Physician (Pulmonary Disease) System, Provider Not In  Patient Active Problem List   Diagnosis Date Noted   Persistent atrial fibrillation ([name]) 03/15/2024   Benign neoplasm of ascending colon 03/14/2024   Benign neoplasm of transverse colon 03/14/2024   Benign neoplasm of descending colon 03/14/2024   Benign neoplasm of sigmoid colon 03/14/2024   Chronic systolic congestive heart failure ([name]) 03/12/2024   Melena 03/12/2024   Heme positive stool 03/11/2024   GI bleed 03/08/2024   History of CAD (coronary artery disease) 03/08/2024   CKD (chronic  kidney disease), stage IV ([name]) 03/08/2024   Continuous dependence on cigarette smoking 03/08/2024   GAD (generalized anxiety disorder) 03/08/2024   HFrEF (heart failure with reduced ejection fraction) ([name]) 10/11/2023   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) and diastolic (congestive) heart failure ([name]) 10/10/2023   Pre-ulcerative calluses 04/25/2022   PAF (paroxysmal atrial fibrillation) ([name]) 05/05/2021   COPD (chronic obstructive pulmonary disease) ([name]) 05/05/2021   OSA (obstructive sleep apnea) 05/05/2021   Stage 3b chronic kidney disease ([name]) 05/05/2021   Pressure injury of skin 05/04/2021   Diabetic nephropathy ([name]) 01/02/2021   Low back pain 06/01/2020   Hyperlipidemia 04/03/2020   Peripheral artery disease ([name]) 12/26/2019   NICM (nonischemic cardiomyopathy) ([name]) 06/20/2019   Non-healing ulcer ([name]) 06/20/2019   Coagulation disorder ([name]) 08/09/2017   Depression 07/21/2017   At risk for adverse drug reaction 06/20/2017   Peripheral neuropathy 06/20/2017   S/P transmetatarsal amputation of foot, right ([name]) 06/05/2017   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) 05/19/2017   Obesity, class 2 02/24/2016   Anticoagulation management encounter 02/10/2016   Chronic sinus bradycardia 01/12/2016   Essential hypertension 12/22/2015   Demand ischemia ([name])    Acute on chronic diastolic CHF (congestive heart failure) ([name])    Symptomatic anemia 11/08/2015   Hypokalemia 11/08/2015   Tobacco abuse 10/23/2015   Coronary artery disease    DOE (  dyspnea on exertion) 04/29/2015   Paroxysmal atrial fibrillation ([name]) 01/16/2015   Carotid artery stenosis 01/16/2015   Insomnia 02/03/2014   S/P peripheral artery angioplasty - TurboHawk atherectomy; R SFA 09/11/2013    Class: Acute   Leg pain, bilateral 08/19/2013   Hypothyroidism 07/31/2013   History of cocaine abuse ([name]) 06/13/2013   Long term current use of  anticoagulant therapy 05/20/2013   Alcohol abuse    Narcotic abuse ([name])    Marijuana abuse    Alcoholic cirrhosis ([name])    Insulin  dependent type 2 diabetes mellitus ([name])     Current Outpatient Medications:    acetaminophen  (TYLENOL ) 500 MG tablet, Take 1,000 mg by mouth every 6 (six) hours as needed for headache (pain)., Disp: , Rfl:    albuterol  (VENTOLIN  HFA) 108 (90 Base) MCG/ACT inhaler, USE 2 PUFFS BY MOUTH EVERY FOUR HOURS, AS NEEDED, FOR COUGHING/WHEEZING, Disp: 8.5 g, Rfl: 0   allopurinol  (ZYLOPRIM ) 100 MG tablet, Take 150 mg by mouth daily., Disp: , Rfl:    amiodarone  (PACERONE ) 200 MG tablet, Take 1 tablet (200 mg total) by mouth daily., Disp: 30 tablet, Rfl: 1   apixaban  (ELIQUIS ) 5 MG TABS tablet, Take 1 tablet (5 mg total) by mouth 2 (two) times daily., Disp: 60 tablet, Rfl: 6   budesonide -formoterol  (SYMBICORT ) 160-4.5 MCG/ACT inhaler, Inhale 2 puffs into the lungs 2 (two) times daily., Disp: 1 each, Rfl: 2   buPROPion  (WELLBUTRIN  XL) 300 MG 24 hr tablet, Take 300 mg by mouth every morning., Disp: , Rfl:    Cholecalciferol  (VITAMIN D3) 50 MCG (2000 UT) capsule, Take 1 capsule by mouth every morning., Disp: , Rfl:    fluconazole  (DIFLUCAN ) 100 MG tablet, Take 1 tablet (100 mg total) by mouth daily for 10 days., Disp: 10 tablet, Rfl: 0   fluticasone  (FLONASE) 50 MCG/ACT nasal spray, Place 1 spray into both nostrils as needed for allergies or rhinitis., Disp: , Rfl:    folic acid  (FOLVITE ) 1 MG tablet, Take 1 tablet (1 mg total) by mouth daily., Disp: 30 tablet, Rfl: 0   gabapentin  (NEURONTIN ) 100 MG capsule, Take 1 capsule (100 mg total) by mouth 3 (three) times daily., Disp: 270 capsule, Rfl: 1   insulin  lispro (HUMALOG ) 100 UNIT/ML KwikPen, Inject 15 Units into the skin 3 (three) times daily with meals. Plus sliding scale, max dose 70 units /day, Disp: 25 mL, Rfl: 1   isosorbide -hydrALAZINE  (BIDIL ) 20-37.5 MG tablet, Take 1 tablet by mouth 2 (two) times daily., Disp: 90 tablet,  Rfl: 0   levothyroxine  (SYNTHROID ) 50 MCG tablet, TAKE 1 TABLET (50 MCG TOTAL) BY MOUTH DAILY BEFORE BREAKFAST (AM), Disp: 30 tablet, Rfl: 0   metoprolol  succinate (TOPROL -XL) 25 MG 24 hr tablet, Take 1 tablet (25 mg total) by mouth daily., Disp: 90 tablet, Rfl: 3   Omega-3 Fatty Acids (FISH OIL PO), Take 1 tablet by mouth daily., Disp: , Rfl:    pantoprazole  (PROTONIX ) 40 MG tablet, TAKE 1 TABLET (40 MG TOTAL) BY MOUTH DAILY(AM), Disp: 30 tablet, Rfl: 0   potassium chloride  SA (KLOR-CON  M) 20 MEQ tablet, Take 40meq in am 20 meq in pm, Disp: 90 tablet, Rfl: 3   rosuvastatin  (CRESTOR ) 40 MG tablet, TAKE 1 TABLET (40 MG TOTAL) BY MOUTH DAILY (Patient taking differently: Take 40 mg by mouth at bedtime.), Disp: 15 tablet, Rfl: 0   tirzepatide (MOUNJARO) 7.5 MG/0.5ML Pen, Inject 7.5 mg into the skin once a week., Disp: 6 mL, Rfl: 0   torsemide  (DEMADEX )  20 MG tablet, Take 4 tablets (80 mg total) by mouth 2 (two) times daily., Disp: 120 tablet, Rfl: 3   VITAMIN E PO, Take 1 tablet by mouth daily., Disp: , Rfl:    colchicine  0.6 MG tablet, Take 1.2 mg by mouth See admin instructions. Take 1.2 mg for flair up and an hour later take 0.6 mg if needed, Disp: , Rfl:    docusate sodium  (COLACE) 100 MG capsule, Take 2 capsules (200 mg total) by mouth at bedtime. (Patient not taking: Reported on 03/25/2024), Disp: 10 capsule, Rfl: 0   erythromycin ophthalmic ointment, Place 1 Application into the right eye 2 (two) times daily., Disp: , Rfl:    LANTUS  SOLOSTAR 100 UNIT/ML Solostar Pen, Inject 30 Units into the skin daily., Disp: 15 mL, Rfl: 0   Menthol -Camphor (ICY HOT PRO NO MESS EX), Apply 1 Application topically 3 (three) times daily as needed (arms, neuropathy lower extrimity, gout in hand.)., Disp: , Rfl:    NYAMYC  powder, Apply 1 Application topically 2 (two) times daily., Disp: , Rfl:    nystatin  cream (MYCOSTATIN ), Apply 1 Application topically 2 (two) times daily as needed for dry skin., Disp: , Rfl:     polyethylene glycol (MIRALAX  / GLYCOLAX ) 17 g packet, Take 17 g by mouth daily as needed for moderate constipation., Disp: 14 each, Rfl: 0   triamcinolone  ointment (KENALOG ) 0.1 %, Apply topically 2 (two) times daily. (Patient taking differently: Apply 1 Application topically 2 (two) times daily as needed (Skin irritation).), Disp: 454 g, Rfl: 0 Allergies  Allergen Reactions   Gabapentin  Nausea And Vomiting and Other (See Comments)    POSSIBLE SHAKING   Lyrica  [Pregabalin ] Other (See Comments)    Shaking       Social History   Socioeconomic History   Marital status: Single    Spouse name: Not on file   Number of children: 1   Years of education: 12   Highest education level: 12th grade  Occupational History   Occupation: disabled  Tobacco Use   Smoking status: Every Day    Current packs/day: 1.00    Average packs/day: 1 pack/day for 44.0 years (44.0 ttl pk-yrs)    Types: E-cigarettes, Cigarettes   Smokeless tobacco: Former    Types: Snuff  Vaping Use   Vaping status: Former   Devices: 11/26/2018 "stopped months ago"  Substance and Sexual Activity   Alcohol use: Not Currently    Comment: occ   Drug use: Not Currently    Types: "Crack" cocaine, Marijuana, Oxycodone    Sexual activity: Not Currently  Other Topics Concern   Not on file  Social History Narrative   ** Merged History Encounter **       Lives in [place], in motel with sister.  They are looking to move but don't have a place to go yet.     Social Drivers of Corporate investment banker Strain: Low Risk  (02/09/2023)   Overall Financial Resource Strain (CARDIA)    Difficulty of Paying Living Expenses: Not hard at all  Food Insecurity: No Food Insecurity (03/09/2024)   Hunger Vital Sign    Worried About Running Out of Food in the Last Year: Never true    Ran Out of Food in the Last Year: Never true  Transportation Needs: Unmet Transportation Needs (03/09/2024)   PRAPARE - Transportation    Lack of Transportation  (Medical): Yes    Lack of Transportation (Non-Medical): Yes  Physical Activity: Inactive (02/09/2023)  Exercise Vital Sign    Days of Exercise per Week: 0 days    Minutes of Exercise per Session: 0 min  Stress: No Stress Concern Present (02/09/2023)   [employer] of Occupational Health - Occupational Stress Questionnaire    Feeling of Stress : Not at all  Social Connections: Unknown (08/21/2023)   Received from [facility]   Social Network    Social Network: Not on file  Intimate Partner Violence: Not At Risk (03/09/2024)   Humiliation, Afraid, Rape, and Kick questionnaire    Fear of Current or Ex-Partner: No    Emotionally Abused: No    Physically Abused: No    Sexually Abused: No    Physical Exam      Future Appointments  Date Time Provider Department Center  03/28/2024 12:00 PM [facility] PA/NP [facility] None  04/01/2024 11:15 AM [facility] US  2 [id number] [facility] W[address] WE  04/09/2024  3:00 PM [facility] 2 [facility] H&V  04/09/2024  4:00 PM Carlene Che, MD [facility] H&V  04/10/2024  2:00 PM Jorge Newcomer, MD [facility] None  05/07/2024  3:15 PM Scotece, Leretha Rankin, RD [facility] NDM       Alfonza Angry, Paramedic [phone] [facility] Paramedic  03/25/24

## 2024-03-28 ENCOUNTER — Telehealth (HOSPITAL_COMMUNITY): Payer: Self-pay

## 2024-03-28 ENCOUNTER — Encounter (HOSPITAL_COMMUNITY)

## 2024-03-28 NOTE — Progress Notes (Incomplete)
 Patient ID: [name], female   DOB: Mar 25, 1961, 63 y.o.   MRN: [id number]    Advanced Heart Failure Clinic Note   PCP: [facility] PV: Dr. Katheryne Pane HF Cardiology: Dr. Mitzie Anda  Reason for Visit: Heart Failure Follow-up HPI: [name] is a 63 y.o. with history of PAD, carotid stenosis s/p left CEA, CAD, chronic systolic CHF, paroxysmal atrial fibrillation, bradycardia, and prior substance abuse.   In 5/18, [name] had peripheral angiography with atherectomy of right SFA.  In 6/18, [name] had right 2nd ray resection  followed by transmetatarsal amputation on right. In 8/20, [name] had peripheral angiography showing totally occluded left SFA.  [name] had a left fem-pop bypass + left 5th toe amputation by Dr. Shirley Douglas.    Echo 1/21 EF 50%, mild LVH, normal RV, PASP 60 mmHg.  Echo 4/22 EF 55-60% with basal to mid inferolateral hypokinesis.   Underwent PV angiogram with Dr. Katheryne Pane 7/22. [name] had drug-coated balloon angioplasty of the right SFA after being admitted for critical ischemia of the right foot.  Repeat peripheral arterial dopplers in 8/22 still showed severe right SFA disease.   Echo 4/24 EF 50-55%, RV normal.  Admitted 9/24 with UTI/sepsis. Required ICU care, BiPap and abx. Underwent RLE angiogram with femoropopliteal angioplasty and stenting.  Two admissions in 11/24 for AF/RVR s/p DCCV. EF was 45-50%, nl RV. [name] was d/c'd on Torsemide  100 bid w/o weekly metolazone  and readmitted for volume overload. [name] was d/c'd with prn Metolazone , however CardioMems reading (PA 31) suggested [name] was not diuresed at discharge. Was instructed to use Furosicx at home. At follow up PA 36, [name] was switched back to Torsemide  with metolazone .  Recent admitted 4/24 for GIB and ABLA. Hgb 5.3 on admission. Transfused. Eliquis  held. Underwent endoscopy 4/30: Esophageal plaques were found consistent with candidiasis.  Normal stomach. Started on fluconazole .   Normal examined duodenal. No source of bleeding on  endoscopy, underwent colonoscopy, multiples polyps found and had polypectomy. GI recommended capsule endoscopy in the future if recurrence of bleeding. Resumed eliquis   5/03.    [name] returns today for heart failure follow up. Overall feeling okay. NYHA IIIb, compounded by knee pain, claudication, and body habitus. Reports fatigue. Denies chest pain, dyspnea, near-syncope, orthopnea, palpitations, dizziness, and abnormal bleeding. Would like a rollator to assist with ambulation. Wearing compression stocking. Smoking 0.5 pack/day. Started Mounjaro, has not had weight loss yet. Compliant with all medications. Follows with Paramedicine. Compliant with CardioMems.   SH: Lives with sister.  Prior cocaine abuse.  Prior ETOH abuse.  Quit smoking in 1/17, restarted, and quit again in 4/19, restarted again.  FH: CAD  Review of systems complete and found to be negative unless listed in HPI.    Current Outpatient Medications  Medication Sig Dispense Refill   acetaminophen  (TYLENOL ) 500 MG tablet Take 1,000 mg by mouth every 6 (six) hours as needed for headache (pain).     albuterol  (VENTOLIN  HFA) 108 (90 Base) MCG/ACT inhaler USE 2 PUFFS BY MOUTH EVERY FOUR HOURS, AS NEEDED, FOR COUGHING/WHEEZING 8.5 g 0   allopurinol  (ZYLOPRIM ) 100 MG tablet Take 150 mg by mouth daily.     amiodarone  (PACERONE ) 200 MG tablet Take 1 tablet (200 mg total) by mouth daily. 30 tablet 1   apixaban  (ELIQUIS ) 5 MG TABS tablet Take 1 tablet (5 mg total) by mouth 2 (two) times daily. 60 tablet 6   budesonide -formoterol  (SYMBICORT ) 160-4.5 MCG/ACT inhaler Inhale 2 puffs into the lungs 2 (two) times daily.  1 each 2   buPROPion  (WELLBUTRIN  XL) 300 MG 24 hr tablet Take 300 mg by mouth every morning.     Cholecalciferol  (VITAMIN D3) 50 MCG (2000 UT) capsule Take 1 capsule by mouth every morning.     colchicine  0.6 MG tablet Take 1.2 mg by mouth See admin instructions. Take 1.2 mg for flair up and an hour later take 0.6 mg if needed      docusate sodium  (COLACE) 100 MG capsule Take 2 capsules (200 mg total) by mouth at bedtime. (Patient not taking: Reported on 03/25/2024) 10 capsule 0   erythromycin ophthalmic ointment Place 1 Application into the right eye 2 (two) times daily.     fluconazole  (DIFLUCAN ) 100 MG tablet Take 1 tablet (100 mg total) by mouth daily for 10 days. 10 tablet 0   fluticasone  (FLONASE) 50 MCG/ACT nasal spray Place 1 spray into both nostrils as needed for allergies or rhinitis.     folic acid  (FOLVITE ) 1 MG tablet Take 1 tablet (1 mg total) by mouth daily. 30 tablet 0   gabapentin  (NEURONTIN ) 100 MG capsule Take 1 capsule (100 mg total) by mouth 3 (three) times daily. 270 capsule 1   insulin  lispro (HUMALOG ) 100 UNIT/ML KwikPen Inject 15 Units into the skin 3 (three) times daily with meals. Plus sliding scale, max dose 70 units /day 25 mL 1   isosorbide -hydrALAZINE  (BIDIL ) 20-37.5 MG tablet Take 1 tablet by mouth 2 (two) times daily. 90 tablet 0   LANTUS  SOLOSTAR 100 UNIT/ML Solostar Pen Inject 30 Units into the skin daily. 15 mL 0   levothyroxine  (SYNTHROID ) 50 MCG tablet TAKE 1 TABLET (50 MCG TOTAL) BY MOUTH DAILY BEFORE BREAKFAST (AM) 30 tablet 0   Menthol -Camphor (ICY HOT PRO NO MESS EX) Apply 1 Application topically 3 (three) times daily as needed (arms, neuropathy lower extrimity, gout in hand.).     metoprolol  succinate (TOPROL -XL) 25 MG 24 hr tablet Take 1 tablet (25 mg total) by mouth daily. 90 tablet 3   NYAMYC  powder Apply 1 Application topically 2 (two) times daily.     nystatin  cream (MYCOSTATIN ) Apply 1 Application topically 2 (two) times daily as needed for dry skin.     Omega-3 Fatty Acids (FISH OIL PO) Take 1 tablet by mouth daily.     pantoprazole  (PROTONIX ) 40 MG tablet TAKE 1 TABLET (40 MG TOTAL) BY MOUTH DAILY(AM) 30 tablet 0   polyethylene glycol (MIRALAX  / GLYCOLAX ) 17 g packet Take 17 g by mouth daily as needed for moderate constipation. 14 each 0   potassium chloride  SA (KLOR-CON  M) 20  MEQ tablet Take 40meq in am 20 meq in pm 90 tablet 3   rosuvastatin  (CRESTOR ) 40 MG tablet TAKE 1 TABLET (40 MG TOTAL) BY MOUTH DAILY (Patient taking differently: Take 40 mg by mouth at bedtime.) 15 tablet 0   tirzepatide (MOUNJARO) 7.5 MG/0.5ML Pen Inject 7.5 mg into the skin once a week. 6 mL 0   torsemide  (DEMADEX ) 20 MG tablet Take 4 tablets (80 mg total) by mouth 2 (two) times daily. 120 tablet 3   triamcinolone  ointment (KENALOG ) 0.1 % Apply topically 2 (two) times daily. (Patient taking differently: Apply 1 Application topically 2 (two) times daily as needed (Skin irritation).) 454 g 0   VITAMIN E PO Take 1 tablet by mouth daily.     No current facility-administered medications for this visit.   LMP  (LMP Unknown)   Wt Readings from Last 3 Encounters:  03/25/24 98.4 kg (  217 lb)  03/18/24 104.8 kg (231 lb)  03/09/24 97.8 kg (215 lb 9.6 oz)   PHYSICAL EXAM: General: Deconditioned appearing. No distress on RA. Arrived by [facility]. Cardiac: JVP flat. S1 and S2 present. No murmurs or rub. Abdomen: Obese, non-tender, non-distended.  Extremities: Warm and dry.  Trace BLE edema. + compression socks Neuro: Alert and oriented x3. Affect pleasant. Generalized weakness  CardioMEMs (personally reviewed): 23   Assessment/Plan: 1. Chronic HF with mid range EF: LHC in 12/15 showing only 80% stenosis in small OM1. EF as low as 40-45% in the past. Echo 4/22 showed EF 55-60% with grade 2 diastolic dysfunction.  Echo (4/24) EF 50-55%.  Echo in 11/24 with EF 45-50%, normal RV, mild MR, anterolateral akinesis.   - NYHA class III (chronic), worsened by body habitus and knee pain.  - Volume ok by exam. Cardiomems right around goal 18-22 over past week.  - Continue torsemide  100 mg bid.  - GDMT now limited by AKI - Stop Spiro and Losartan . Start Hydral 25 mg tid. BMET/BNP today, repeat in 2wks - Off Jardiance  due to severe UTIs and uncontrolled A1C. - Continue Toprol  XL 25 mg daily.   2. CKD IV:  Prev  baseline ~1.5. Has been >2 since January. - elevated up to 2.8 over the last 2 months. Last Cr 2.49. - in the setting of cardio-renal disease and poorly controlled T2DM. A1C 12.6. - stop losartan  and spiro as above.  - Referral to [facility]; attempted to refer at last appointment.  - BMET today  3. Atrial fibrillation: Paroxysmal. SR on exam - Continue Eliquis  5 mg bid. No abnormal bleeding  4. PAD: Claudication bilaterally. Not candidate for cilostazol with CHF.  [name] has had a left fem-pop bypass in 8/20. [name] underwent PV angiogram with balloon angioplasty of the R SFA 7/22. 8/22 dopplers still showed severe right SFA disease. Dopplers 2/23 showed patent right SFA.  Peripheral arterial dopplers in 2/23 showed 50-74% stenosis right SFA and popliteal.  S/p RLE fem-pop angioplasty and stenting in 9/24. [name] still has bilateral foot ulcers.  - [facility], goes [facility] 3 weeks. - Continue rosuvastatin .  - [name] is on apixaban  so no ASA.   5. COPD/OSA:  Smoking 0.5pack/day. Continue bupropion . Attempting to quit.  - Discussed smoking cessation.    - Not able to tolerate CPAP.    6. HTN:  - Start Hydral as above. Will follow BP with Paramedicine.  7.Type 2 diabetes: Poorly controlled. A1C 12.6. On insulin .  - Now follows with Endocrine. - Reports BG has been improving, continues to work at control. - Needs opthomologist for eye exam  8. CAD/HLD: LHC in 12/15 with 80% stenosis in small OM1, nonobstructive disease in other territories. No chest pain. - Continue rosuvastatin  + Vascepa  - Not on ASA with need for Eliquis .  9. Carotid stenosis: Stable 4/24 dopplers.  10. Obesity: There is no height or weight on file to calculate BMI. - On Mounjaro - has not had weight loss yet  [name] would like referral to Opthomologist and a rollator for ambulation assistance. Direct message sent to PCP.   Follow up with APP in 4 weeks  Ruddy Corral, PA-C 03/28/2024

## 2024-03-28 NOTE — Telephone Encounter (Signed)
 Contacted [facility] to request the following for refills:   Gabapentin  Rosuvastatin  potassium   Alfonza Angry, Paramedic [phone] 03/28/24

## 2024-04-01 ENCOUNTER — Other Ambulatory Visit (HOSPITAL_COMMUNITY): Payer: Self-pay

## 2024-04-01 ENCOUNTER — Other Ambulatory Visit: Payer: Self-pay | Admitting: Cardiovascular Disease

## 2024-04-01 ENCOUNTER — Other Ambulatory Visit

## 2024-04-01 NOTE — Progress Notes (Signed)
 Paramedicine Encounter    Patient ID: [name], female    DOB: 03/17/1961, 63 y.o.   MRN: [id number]   Complaints-shaking again   Edema-yes  Compliance with meds-yes  Pill box filled-yes If so, by whom-paramedic   Refills needed-  Rosuvastatin --needs in sun pm and sat pm dose--pharm reports needs refills-sent to provider   Next Monday is holiday so I will have to come back this week once the rosuvastatin  is filled and will just top everything back off.  She missed her US  today, she did get that resch along with the missed HF clinic appoint from last week.  She denies increased sob. No dizziness. She reports her shaking has started back up again since back on the gabapentin . I told her to f/u with PCP regarding this, it does help her pain but at times the shaking is intolerable.  Her legs are still swollen,her weight is stable though. She does have chronic leg edema though.  Using cardiomems most of every day.  Trying to watch the sodium and fluid intake but fixed income is difficult.   Meds verified and pill box refilled.     CBG-208  BP 126/60   Pulse 65   Resp 18   Wt 216 lb (98 kg)   LMP  (LMP Unknown)   SpO2 98%   BMI 38.26 kg/m  Weight yesterday-? Last visit weight-217  Patient Care Team: Maryellen Snare, NP as PCP - General Katheryne Pane Frederico Jan, MD as PCP - Cardiology (Cardiology) Darlis Eisenmenger, MD as PCP - Advanced Heart Failure (Cardiology) Lei Pump, MD as PCP - Electrophysiology (Cardiology) Avanell Leigh, MD as Consulting Physician (Cardiology) Diamond Formica, MD as Consulting Physician (Pulmonary Disease) System, Provider Not In  Patient Active Problem List   Diagnosis Date Noted   Persistent atrial fibrillation ([name]) 03/15/2024   Benign neoplasm of ascending colon 03/14/2024   Benign neoplasm of transverse colon 03/14/2024   Benign neoplasm of descending colon 03/14/2024   Benign neoplasm of sigmoid colon 03/14/2024   Chronic  systolic congestive heart failure ([name]) 03/12/2024   Melena 03/12/2024   Heme positive stool 03/11/2024   GI bleed 03/08/2024   History of CAD (coronary artery disease) 03/08/2024   CKD (chronic kidney disease), stage IV ([name]) 03/08/2024   Continuous dependence on cigarette smoking 03/08/2024   GAD (generalized anxiety disorder) 03/08/2024   HFrEF (heart failure with reduced ejection fraction) ([name]) 10/11/2023   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) and diastolic (congestive) heart failure ([name]) 10/10/2023   Pre-ulcerative calluses 04/25/2022   PAF (paroxysmal atrial fibrillation) ([name]) 05/05/2021   COPD (chronic obstructive pulmonary disease) ([name]) 05/05/2021   OSA (obstructive sleep apnea) 05/05/2021   Stage 3b chronic kidney disease ([name]) 05/05/2021   Pressure injury of skin 05/04/2021   Diabetic nephropathy ([name]) 01/02/2021   Low back pain 06/01/2020   Hyperlipidemia 04/03/2020   Peripheral artery disease ([name]) 12/26/2019   NICM (nonischemic cardiomyopathy) ([name]) 06/20/2019   Non-healing ulcer ([name]) 06/20/2019   Coagulation disorder ([name]) 08/09/2017   Depression 07/21/2017   At risk for adverse drug reaction 06/20/2017   Peripheral neuropathy 06/20/2017   S/P transmetatarsal amputation of foot, right ([name]) 06/05/2017   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) 05/19/2017   Obesity, class 2 02/24/2016   Anticoagulation management encounter 02/10/2016   Chronic sinus bradycardia 01/12/2016   Essential hypertension 12/22/2015   Demand ischemia ([name])    Acute on chronic diastolic  CHF (congestive heart failure) ([name])    Symptomatic anemia 11/08/2015   Hypokalemia 11/08/2015   Tobacco abuse 10/23/2015   Coronary artery disease    DOE (dyspnea on exertion) 04/29/2015   Paroxysmal atrial fibrillation ([name]) 01/16/2015   Carotid artery stenosis 01/16/2015   Insomnia 02/03/2014   S/P peripheral artery angioplasty -  TurboHawk atherectomy; R SFA 09/11/2013    Class: Acute   Leg pain, bilateral 08/19/2013   Hypothyroidism 07/31/2013   History of cocaine abuse ([name]) 06/13/2013   Long term current use of anticoagulant therapy 05/20/2013   Alcohol abuse    Narcotic abuse ([name])    Marijuana abuse    Alcoholic cirrhosis ([name])    Insulin  dependent type 2 diabetes mellitus ([name])     Current Outpatient Medications:    acetaminophen  (TYLENOL ) 500 MG tablet, Take 1,000 mg by mouth every 6 (six) hours as needed for headache (pain)., Disp: , Rfl:    albuterol  (VENTOLIN  HFA) 108 (90 Base) MCG/ACT inhaler, USE 2 PUFFS BY MOUTH EVERY FOUR HOURS, AS NEEDED, FOR COUGHING/WHEEZING, Disp: 8.5 g, Rfl: 0   allopurinol  (ZYLOPRIM ) 100 MG tablet, Take 150 mg by mouth daily., Disp: , Rfl:    amiodarone  (PACERONE ) 200 MG tablet, Take 1 tablet (200 mg total) by mouth daily., Disp: 30 tablet, Rfl: 1   apixaban  (ELIQUIS ) 5 MG TABS tablet, Take 1 tablet (5 mg total) by mouth 2 (two) times daily., Disp: 60 tablet, Rfl: 6   budesonide -formoterol  (SYMBICORT ) 160-4.5 MCG/ACT inhaler, Inhale 2 puffs into the lungs 2 (two) times daily., Disp: 1 each, Rfl: 2   buPROPion  (WELLBUTRIN  XL) 300 MG 24 hr tablet, Take 300 mg by mouth every morning., Disp: , Rfl:    fluticasone  (FLONASE) 50 MCG/ACT nasal spray, Place 1 spray into both nostrils as needed for allergies or rhinitis., Disp: , Rfl:    folic acid  (FOLVITE ) 1 MG tablet, Take 1 tablet (1 mg total) by mouth daily., Disp: 30 tablet, Rfl: 0   gabapentin  (NEURONTIN ) 100 MG capsule, Take 1 capsule (100 mg total) by mouth 3 (three) times daily., Disp: 270 capsule, Rfl: 1   insulin  lispro (HUMALOG ) 100 UNIT/ML KwikPen, Inject 15 Units into the skin 3 (three) times daily with meals. Plus sliding scale, max dose 70 units /day, Disp: 25 mL, Rfl: 1   isosorbide -hydrALAZINE  (BIDIL ) 20-37.5 MG tablet, Take 1 tablet by mouth 2 (two) times daily., Disp: 90 tablet, Rfl: 0   LANTUS  SOLOSTAR 100 UNIT/ML  Solostar Pen, Inject 30 Units into the skin daily., Disp: 15 mL, Rfl: 0   levothyroxine  (SYNTHROID ) 50 MCG tablet, TAKE 1 TABLET (50 MCG TOTAL) BY MOUTH DAILY BEFORE BREAKFAST (AM), Disp: 30 tablet, Rfl: 0   Menthol -Camphor (ICY HOT PRO NO MESS EX), Apply 1 Application topically 3 (three) times daily as needed (arms, neuropathy lower extrimity, gout in hand.)., Disp: , Rfl:    metoprolol  succinate (TOPROL -XL) 25 MG 24 hr tablet, Take 1 tablet (25 mg total) by mouth daily., Disp: 90 tablet, Rfl: 3   Omega-3 Fatty Acids (FISH OIL PO), Take 1 tablet by mouth daily., Disp: , Rfl:    pantoprazole  (PROTONIX ) 40 MG tablet, TAKE 1 TABLET (40 MG TOTAL) BY MOUTH DAILY(AM), Disp: 30 tablet, Rfl: 0   polyethylene glycol (MIRALAX  / GLYCOLAX ) 17 g packet, Take 17 g by mouth daily as needed for moderate constipation., Disp: 14 each, Rfl: 0   potassium chloride  SA (KLOR-CON  M) 20 MEQ tablet, Take 40meq in am 20 meq in pm, Disp:  90 tablet, Rfl: 3   rosuvastatin  (CRESTOR ) 40 MG tablet, TAKE 1 TABLET (40 MG TOTAL) BY MOUTH DAILY (Patient taking differently: Take 40 mg by mouth at bedtime.), Disp: 15 tablet, Rfl: 0   tirzepatide (MOUNJARO) 7.5 MG/0.5ML Pen, Inject 7.5 mg into the skin once a week., Disp: 6 mL, Rfl: 0   torsemide  (DEMADEX ) 20 MG tablet, Take 4 tablets (80 mg total) by mouth 2 (two) times daily., Disp: 120 tablet, Rfl: 3   triamcinolone  ointment (KENALOG ) 0.1 %, Apply topically 2 (two) times daily. (Patient taking differently: Apply 1 Application topically 2 (two) times daily as needed (Skin irritation).), Disp: 454 g, Rfl: 0   VITAMIN E PO, Take 1 tablet by mouth daily., Disp: , Rfl:    Cholecalciferol  (VITAMIN D3) 50 MCG (2000 UT) capsule, Take 1 capsule by mouth every morning., Disp: , Rfl:    colchicine  0.6 MG tablet, Take 1.2 mg by mouth See admin instructions. Take 1.2 mg for flair up and an hour later take 0.6 mg if needed (Patient not taking: Reported on 04/01/2024), Disp: , Rfl:    docusate sodium   (COLACE) 100 MG capsule, Take 2 capsules (200 mg total) by mouth at bedtime. (Patient not taking: Reported on 04/01/2024), Disp: 10 capsule, Rfl: 0   erythromycin ophthalmic ointment, Place 1 Application into the right eye 2 (two) times daily., Disp: , Rfl:    NYAMYC  powder, Apply 1 Application topically 2 (two) times daily., Disp: , Rfl:    nystatin  cream (MYCOSTATIN ), Apply 1 Application topically 2 (two) times daily as needed for dry skin., Disp: , Rfl:  Allergies  Allergen Reactions   Gabapentin  Nausea And Vomiting and Other (See Comments)    POSSIBLE SHAKING   Lyrica  [Pregabalin ] Other (See Comments)    Shaking       Social History   Socioeconomic History   Marital status: Single    Spouse name: Not on file   Number of children: 1   Years of education: 12   Highest education level: 12th grade  Occupational History   Occupation: disabled  Tobacco Use   Smoking status: Every Day    Current packs/day: 1.00    Average packs/day: 1 pack/day for 44.0 years (44.0 ttl pk-yrs)    Types: E-cigarettes, Cigarettes   Smokeless tobacco: Former    Types: Snuff  Vaping Use   Vaping status: Former   Devices: 11/26/2018 "stopped months ago"  Substance and Sexual Activity   Alcohol use: Not Currently    Comment: occ   Drug use: Not Currently    Types: "Crack" cocaine, Marijuana, Oxycodone    Sexual activity: Not Currently  Other Topics Concern   Not on file  Social History Narrative   ** Merged History Encounter **       Lives in [place], in motel with sister.  They are looking to move but don't have a place to go yet.     Social Drivers of Corporate investment banker Strain: Low Risk  (02/09/2023)   Overall Financial Resource Strain (CARDIA)    Difficulty of Paying Living Expenses: Not hard at all  Food Insecurity: No Food Insecurity (03/09/2024)   Hunger Vital Sign    Worried About Running Out of Food in the Last Year: Never true    Ran Out of Food in the Last Year: Never true   Transportation Needs: Unmet Transportation Needs (03/09/2024)   PRAPARE - Administrator, Civil Service (Medical): Yes    Lack  of Transportation (Non-Medical): Yes  Physical Activity: Inactive (02/09/2023)   Exercise Vital Sign    Days of Exercise per Week: 0 days    Minutes of Exercise per Session: 0 min  Stress: No Stress Concern Present (02/09/2023)   [employer] of Occupational Health - Occupational Stress Questionnaire    Feeling of Stress : Not at all  Social Connections: Unknown (08/21/2023)   Received from [facility]   Social Network    Social Network: Not on file  Intimate Partner Violence: Not At Risk (03/09/2024)   Humiliation, Afraid, Rape, and Kick questionnaire    Fear of Current or Ex-Partner: No    Emotionally Abused: No    Physically Abused: No    Sexually Abused: No    Physical Exam      Future Appointments  Date Time Provider Department Center  04/09/2024  3:00 PM [facility] 2 [facility] H&V  04/09/2024  4:00 PM Carlene Che, MD [facility] H&V  04/10/2024  2:00 PM Jorge Newcomer, MD [facility] None  04/22/2024  3:00 PM [facility] PA/NP [facility] None  04/29/2024  2:20 PM Thomasena Fleming, NP [facility]ST H&V  05/07/2024  3:15 PM Scotece, Leretha Rankin, RD [facility] NDM       Alfonza Angry, Paramedic [phone] [facility] Paramedic  04/01/24

## 2024-04-03 ENCOUNTER — Telehealth: Payer: Self-pay | Admitting: Acute Care

## 2024-04-03 DIAGNOSIS — Z122 Encounter for screening for malignant neoplasm of respiratory organs: Secondary | ICD-10-CM

## 2024-04-03 DIAGNOSIS — Z87891 Personal history of nicotine dependence: Secondary | ICD-10-CM

## 2024-04-03 DIAGNOSIS — F17210 Nicotine dependence, cigarettes, uncomplicated: Secondary | ICD-10-CM

## 2024-04-03 NOTE — Telephone Encounter (Signed)
 Lung Cancer Screening Narrative/Criteria Questionnaire (Cigarette Smokers Only- No Cigars/Pipes/vapes)   [name]   SDMV:04/22/2024 11:30 [name]        September 14, 1961   LDCT: 06/17/2024 1:20 GI    63 y.o.   Phone: [phone]  Lung Screening Narrative (confirm age 68-77 yrs Medicare / 50-80 yrs Private pay insurance)   Insurance information:[employer] mcr   Referring Provider:Dr. Landrum Pink   This screening involves an initial phone call with a team member from our program. It is called a shared decision making visit. The initial meeting is required by  insurance and Medicare to make sure you understand the program. This appointment takes about 15-20 minutes to complete. You will complete the screening scan at your scheduled date/time.  This scan takes about 5-10 minutes to complete. You can eat and drink normally before and after the scan.  Criteria questions for Lung Cancer Screening:   Are you a current or former smoker? Current Age began smoking: 63yo   If you are a former smoker, what year did you quit smoking? N/A(within 15 yrs)   To calculate your smoking history, I need an accurate estimate of how many packs of cigarettes you smoked per day and for how many years. (Not just the number of PPD you are now smoking)   Years smoking 49 x Packs per day 1 = Pack years 49   (at least 20 pack yrs)   (Make sure they understand that we need to know how much they have smoked in the past, not just the number of PPD they are smoking now)  Do you have a personal history of cancer?  No    Do you have a family history of cancer? Yes  (cancer type and and relative) Mother - breast and colon  Are you coughing up blood?  No  Have you had unexplained weight loss of 15 lbs or more in the last 6 months? No  It looks like you meet all criteria.  When would be a good time for us  to schedule you for this screening?   Additional information: N/A

## 2024-04-04 ENCOUNTER — Other Ambulatory Visit (HOSPITAL_COMMUNITY): Payer: Self-pay

## 2024-04-04 MED ORDER — ROSUVASTATIN CALCIUM 40 MG PO TABS: 40.0000 mg | ORAL_TABLET | Freq: Every day | ORAL | 3 refills | Status: DC

## 2024-04-04 MED ORDER — TORSEMIDE 20 MG PO TABS
80.0000 mg | ORAL_TABLET | Freq: Two times a day (BID) | ORAL | 6 refills | Status: DC
Start: 2024-04-04 — End: 2024-04-22

## 2024-04-04 NOTE — Progress Notes (Addendum)
 Paramedicine Encounter    Patient ID: [name], female    DOB: Apr 02, 1961, 63 y.o.   MRN: [id number]  Came out today for med rec, waited on pharm to deliver some meds and then had to wait for pharm to get rx to fill- I p/u yesterday-so refilled her pill box up since I will not be here on Monday.    Refills needed- Eliquis  Levothyroxine  Folic acid   Wellbutrin   Metoprolol  Amio  Pantoprazole   Isosorbide    Her torsemide  and rosuvastatin  that was delivered only had 15 day count.   Will send message to triage to send in 30 day supply--maybe 90 day supply if possible.   Meds verified and pill box refilled.   Patient Care Team: Maryellen Snare, NP as PCP - General Katheryne Pane Frederico Jan, MD as PCP - Cardiology (Cardiology) Darlis Eisenmenger, MD as PCP - Advanced Heart Failure (Cardiology) Lei Pump, MD as PCP - Electrophysiology (Cardiology) Avanell Leigh, MD as Consulting Physician (Cardiology) Diamond Formica, MD as Consulting Physician (Pulmonary Disease) System, Provider Not In  Patient Active Problem List   Diagnosis Date Noted   Persistent atrial fibrillation ([name]) 03/15/2024   Benign neoplasm of ascending colon 03/14/2024   Benign neoplasm of transverse colon 03/14/2024   Benign neoplasm of descending colon 03/14/2024   Benign neoplasm of sigmoid colon 03/14/2024   Chronic systolic congestive heart failure ([name]) 03/12/2024   Melena 03/12/2024   Heme positive stool 03/11/2024   GI bleed 03/08/2024   History of CAD (coronary artery disease) 03/08/2024   CKD (chronic kidney disease), stage IV ([name]) 03/08/2024   Continuous dependence on cigarette smoking 03/08/2024   GAD (generalized anxiety disorder) 03/08/2024   HFrEF (heart failure with reduced ejection fraction) ([name]) 10/11/2023   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) and diastolic (congestive) heart failure ([name]) 10/10/2023   Pre-ulcerative calluses 04/25/2022   PAF (paroxysmal atrial  fibrillation) ([name]) 05/05/2021   COPD (chronic obstructive pulmonary disease) ([name]) 05/05/2021   OSA (obstructive sleep apnea) 05/05/2021   Stage 3b chronic kidney disease ([name]) 05/05/2021   Pressure injury of skin 05/04/2021   Diabetic nephropathy ([name]) 01/02/2021   Low back pain 06/01/2020   Hyperlipidemia 04/03/2020   Peripheral artery disease ([name]) 12/26/2019   NICM (nonischemic cardiomyopathy) ([name]) 06/20/2019   Non-healing ulcer ([name]) 06/20/2019   Coagulation disorder ([name]) 08/09/2017   Depression 07/21/2017   At risk for adverse drug reaction 06/20/2017   Peripheral neuropathy 06/20/2017   S/P transmetatarsal amputation of foot, right ([name]) 06/05/2017   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) 05/19/2017   Obesity, class 2 02/24/2016   Anticoagulation management encounter 02/10/2016   Chronic sinus bradycardia 01/12/2016   Essential hypertension 12/22/2015   Demand ischemia ([name])    Acute on chronic diastolic CHF (congestive heart failure) ([name])    Symptomatic anemia 11/08/2015   Hypokalemia 11/08/2015   Tobacco abuse 10/23/2015   Coronary artery disease    DOE (dyspnea on exertion) 04/29/2015   Paroxysmal atrial fibrillation ([name]) 01/16/2015   Carotid artery stenosis 01/16/2015   Insomnia 02/03/2014   S/P peripheral artery angioplasty - TurboHawk atherectomy; R SFA 09/11/2013    Class: Acute   Leg pain, bilateral 08/19/2013   Hypothyroidism 07/31/2013   History of cocaine abuse ([name]) 06/13/2013   Long term current use of anticoagulant therapy 05/20/2013   Alcohol abuse    Narcotic abuse ([name])    Marijuana abuse    Alcoholic cirrhosis ([name])  Insulin  dependent type 2 diabetes mellitus ([name])     Current Outpatient Medications:    acetaminophen  (TYLENOL ) 500 MG tablet, Take 1,000 mg by mouth every 6 (six) hours as needed for headache (pain)., Disp: , Rfl:    albuterol  (VENTOLIN  HFA) 108 (90 Base) MCG/ACT  inhaler, USE 2 PUFFS BY MOUTH EVERY FOUR HOURS, AS NEEDED, FOR COUGHING/WHEEZING, Disp: 8.5 g, Rfl: 0   allopurinol  (ZYLOPRIM ) 100 MG tablet, Take 150 mg by mouth daily., Disp: , Rfl:    amiodarone  (PACERONE ) 200 MG tablet, Take 1 tablet (200 mg total) by mouth daily., Disp: 30 tablet, Rfl: 1   apixaban  (ELIQUIS ) 5 MG TABS tablet, Take 1 tablet (5 mg total) by mouth 2 (two) times daily., Disp: 60 tablet, Rfl: 6   budesonide -formoterol  (SYMBICORT ) 160-4.5 MCG/ACT inhaler, Inhale 2 puffs into the lungs 2 (two) times daily., Disp: 1 each, Rfl: 2   buPROPion  (WELLBUTRIN  XL) 300 MG 24 hr tablet, Take 300 mg by mouth every morning., Disp: , Rfl:    Cholecalciferol  (VITAMIN D3) 50 MCG (2000 UT) capsule, Take 1 capsule by mouth every morning., Disp: , Rfl:    colchicine  0.6 MG tablet, Take 1.2 mg by mouth See admin instructions. Take 1.2 mg for flair up and an hour later take 0.6 mg if needed (Patient not taking: Reported on 04/01/2024), Disp: , Rfl:    docusate sodium  (COLACE) 100 MG capsule, Take 2 capsules (200 mg total) by mouth at bedtime. (Patient not taking: Reported on 04/01/2024), Disp: 10 capsule, Rfl: 0   erythromycin ophthalmic ointment, Place 1 Application into the right eye 2 (two) times daily., Disp: , Rfl:    fluticasone  (FLONASE) 50 MCG/ACT nasal spray, Place 1 spray into both nostrils as needed for allergies or rhinitis., Disp: , Rfl:    folic acid  (FOLVITE ) 1 MG tablet, Take 1 tablet (1 mg total) by mouth daily., Disp: 30 tablet, Rfl: 0   gabapentin  (NEURONTIN ) 100 MG capsule, Take 1 capsule (100 mg total) by mouth 3 (three) times daily., Disp: 270 capsule, Rfl: 1   insulin  lispro (HUMALOG ) 100 UNIT/ML KwikPen, Inject 15 Units into the skin 3 (three) times daily with meals. Plus sliding scale, max dose 70 units /day, Disp: 25 mL, Rfl: 1   isosorbide -hydrALAZINE  (BIDIL ) 20-37.5 MG tablet, Take 1 tablet by mouth 2 (two) times daily., Disp: 90 tablet, Rfl: 0   LANTUS  SOLOSTAR 100 UNIT/ML  Solostar Pen, Inject 30 Units into the skin daily., Disp: 15 mL, Rfl: 0   levothyroxine  (SYNTHROID ) 50 MCG tablet, TAKE 1 TABLET (50 MCG TOTAL) BY MOUTH DAILY BEFORE BREAKFAST (AM), Disp: 30 tablet, Rfl: 0   Menthol -Camphor (ICY HOT PRO NO MESS EX), Apply 1 Application topically 3 (three) times daily as needed (arms, neuropathy lower extrimity, gout in hand.)., Disp: , Rfl:    metoprolol  succinate (TOPROL -XL) 25 MG 24 hr tablet, Take 1 tablet (25 mg total) by mouth daily., Disp: 90 tablet, Rfl: 3   NYAMYC  powder, Apply 1 Application topically 2 (two) times daily., Disp: , Rfl:    nystatin  cream (MYCOSTATIN ), Apply 1 Application topically 2 (two) times daily as needed for dry skin., Disp: , Rfl:    Omega-3 Fatty Acids (FISH OIL PO), Take 1 tablet by mouth daily., Disp: , Rfl:    pantoprazole  (PROTONIX ) 40 MG tablet, TAKE 1 TABLET (40 MG TOTAL) BY MOUTH DAILY(AM), Disp: 30 tablet, Rfl: 0   polyethylene glycol (MIRALAX  / GLYCOLAX ) 17 g packet, Take 17 g by mouth daily as  needed for moderate constipation., Disp: 14 each, Rfl: 0   potassium chloride  SA (KLOR-CON  M) 20 MEQ tablet, Take 40meq in am 20 meq in pm, Disp: 90 tablet, Rfl: 3   rosuvastatin  (CRESTOR ) 40 MG tablet, TAKE 1 TABLET (40 MG TOTAL) BY MOUTH DAILY, Disp: 15 tablet, Rfl: 0   tirzepatide (MOUNJARO) 7.5 MG/0.5ML Pen, Inject 7.5 mg into the skin once a week., Disp: 6 mL, Rfl: 0   torsemide  (DEMADEX ) 20 MG tablet, Take 4 tablets (80 mg total) by mouth 2 (two) times daily., Disp: 120 tablet, Rfl: 3   triamcinolone  ointment (KENALOG ) 0.1 %, Apply topically 2 (two) times daily. (Patient taking differently: Apply 1 Application topically 2 (two) times daily as needed (Skin irritation).), Disp: 454 g, Rfl: 0   VITAMIN E PO, Take 1 tablet by mouth daily., Disp: , Rfl:  Allergies  Allergen Reactions   Gabapentin  Nausea And Vomiting and Other (See Comments)    POSSIBLE SHAKING   Lyrica  [Pregabalin ] Other (See Comments)    Shaking       Social  History   Socioeconomic History   Marital status: Single    Spouse name: Not on file   Number of children: 1   Years of education: 12   Highest education level: 12th grade  Occupational History   Occupation: disabled  Tobacco Use   Smoking status: Every Day    Current packs/day: 1.00    Average packs/day: 1 pack/day for 44.0 years (44.0 ttl pk-yrs)    Types: E-cigarettes, Cigarettes   Smokeless tobacco: Former    Types: Snuff  Vaping Use   Vaping status: Former   Devices: 11/26/2018 "stopped months ago"  Substance and Sexual Activity   Alcohol use: Not Currently    Comment: occ   Drug use: Not Currently    Types: "Crack" cocaine, Marijuana, Oxycodone    Sexual activity: Not Currently  Other Topics Concern   Not on file  Social History Narrative   ** Merged History Encounter **       Lives in [place], in motel with sister.  They are looking to move but don't have a place to go yet.     Social Drivers of Corporate investment banker Strain: Low Risk  (02/09/2023)   Overall Financial Resource Strain (CARDIA)    Difficulty of Paying Living Expenses: Not hard at all  Food Insecurity: No Food Insecurity (03/09/2024)   Hunger Vital Sign    Worried About Running Out of Food in the Last Year: Never true    Ran Out of Food in the Last Year: Never true  Transportation Needs: Unmet Transportation Needs (03/09/2024)   PRAPARE - Transportation    Lack of Transportation (Medical): Yes    Lack of Transportation (Non-Medical): Yes  Physical Activity: Inactive (02/09/2023)   Exercise Vital Sign    Days of Exercise per Week: 0 days    Minutes of Exercise per Session: 0 min  Stress: No Stress Concern Present (02/09/2023)   [employer] of Occupational Health - Occupational Stress Questionnaire    Feeling of Stress : Not at all  Social Connections: Unknown (08/21/2023)   Received from [facility]   Social Network    Social Network: Not on file  Intimate Partner Violence: Not At Risk  (03/09/2024)   Humiliation, Afraid, Rape, and Kick questionnaire    Fear of Current or Ex-Partner: No    Emotionally Abused: No    Physically Abused: No    Sexually Abused: No  Physical Exam      Future Appointments  Date Time Provider Department Center  04/09/2024  3:00 PM [facility] 2 [facility] H&V  04/09/2024  4:00 PM Carlene Che, MD [facility] H&V  04/10/2024  2:00 PM Jorge Newcomer, MD [facility] None  04/22/2024 11:30 AM [facility] CANCER SCREENING NURSE VISIT [facility] None  04/22/2024  3:00 PM [facility] PA/NP [facility] None  04/29/2024  2:20 PM Thomasena Fleming, NP [facility] H&V  05/07/2024  3:15 PM Scotece, Leretha Rankin, RD [facility] NDM  06/17/2024  1:20 PM GI-[facility] CT 2 GI-315[facility]       Alfonza Angry, Paramedic [phone] [facility] Paramedic  04/04/24

## 2024-04-09 ENCOUNTER — Telehealth (HOSPITAL_COMMUNITY): Payer: Self-pay

## 2024-04-09 ENCOUNTER — Ambulatory Visit: Payer: 59 | Attending: Vascular Surgery | Admitting: Vascular Surgery

## 2024-04-09 ENCOUNTER — Encounter: Payer: Self-pay | Admitting: Vascular Surgery

## 2024-04-09 ENCOUNTER — Ambulatory Visit (HOSPITAL_COMMUNITY)
Admission: RE | Admit: 2024-04-09 | Discharge: 2024-04-09 | Disposition: A | Discharge status: Home / Self Care | Payer: 59 | Source: Ambulatory Visit | Attending: Vascular Surgery | Admitting: Vascular Surgery

## 2024-04-09 VITALS — BP 137/69 | HR 66 | Temp 98.3°F

## 2024-04-09 DIAGNOSIS — I70235 Atherosclerosis of native arteries of right leg with ulceration of other part of foot: Secondary | ICD-10-CM | POA: Insufficient documentation

## 2024-04-09 DIAGNOSIS — I739 Peripheral vascular disease, unspecified: Secondary | ICD-10-CM | POA: Diagnosis not present

## 2024-04-09 LAB — VAS US ABI WITH/WO TBI
Left ABI: 0.99
Right ABI: 0.66

## 2024-04-09 NOTE — Progress Notes (Signed)
 [facility] [place]  ASSESSMENT / PLAN: [name] is a 63 y.o. female status post right femoropopliteal angioplasty and stenting (6x12mm + 6x52mm Eluvia) on 08/11/23.  Her right foot is completely healed  Recommend:  Abstinence from all tobacco products. Blood glucose control with goal A1c < 7%. Blood pressure control with goal blood pressure < 140/90 mmHg. Lipid reduction therapy with goal Price <100 mg/dL  Aspirin  81mg  PO daily.  Clopidogrel  75mg  PO daily Atorvastatin  40-80mg  PO daily (or other "high intensity" statin therapy).  Follow-up with [facility] in 12 months with repeat ABI.  CHIEF COMPLAINT: follow up PAD  HISTORY OF PRESENT ILLNESS: [name] is a 63 y.o. female who presents to clinic for evaluation of right foot ulceration after referral by PA Stone.  She is a prior patient of my partner, Dr. Ena Harries Early who performed a left common femoral to posterior tibial artery bypass for limb threatening ischemia in 2020.  She has noticed development of a right plantar ulcer which is very painful to her.  She has previously undergone TMA.  I reviewed the rationale for an angiogram for limb salvage.  She is understanding and willing to proceed.  09/11/23: Patient returns to clinic for follow-up after angiogram.  I reviewed her angiogram results in detail personally and with her.  Patient essentially has a trifurcation occlusion with reconstitution of the distal peroneal artery with limited runoff in the foot.  Her right plantar ulcer is healing slowly, per her report.  She has many challenges to reconstruction with the bypass including chronic venous insufficiency and obesity making wound healing difficult.  10/10/23: Patient presents to clinic for evaluation of her right foot wound, but is highly symptomatic from dyspnea and lower extremity leg swelling.  She reports new blistering in bilateral lower extremities.  She wants to go hospital.  I encouraged her to do  so.  04/09/24: Doing well overall.  No major issues.  She is minimally ambulatory.  She is trying to move more, but having a hard time with weakness and deconditioning.  No new symptoms of claudication, rest pain, or ischemic ulceration..  Past Medical History:  Diagnosis Date   Acute renal failure ([name])    Acute respiratory failure with hypoxia ([name]) 12/02/2019   Alcohol abuse    Alcoholic cirrhosis ([name])    Anemia    Anxiety    Arthritis    "knees" (11/26/2018)   B12 deficiency    CAD (coronary artery disease)    a. 11/10/2014 Cath: LM nl, LAD min irregs, D1 30 ost, D2 50d, LCX 72m, OM1 80 p/m (1.5 mm vessel), OM2 10m, RCA nondom 57m-->med rx.. Demand ischemia in the setting of rapid a-fib.   Cardiomyopathy ([name])    Carotid artery disease ([name])    a. 01/2015 Carotid Angio: RICA 100, LICA 95p; b. 02/2015 s/p L CEA; c. 05/2019 Carotid U/S: RICA 100. RECA >50. LICA 1-39%.   Cellulitis    lower extremities   Cellulitis in diabetic foot ([name]) 07/08/2019   CHF (congestive heart failure) ([name])    Chronic combined systolic and diastolic CHF (congestive heart failure) ([name])    a. 10/2014 Echo: EF 40-45%; b. 10/2018 Echo: EF 45-50%, gr2 DD; c. 11/2019 Echo: EF 50%, mild LVH, gr2 DD (restrictive), antlat HK, Nl RV fxn. Mild BAE. RVSP 59.23mmHg.   CKD (chronic kidney disease), stage III ([name])    Cocaine abuse ([name])    COPD (chronic obstructive pulmonary disease) ([name])  Critical ischemia of foot ([name]) 06/07/2021   Critical limb ischemia     Critical lower limb ischemia ([name])    Demand ischemia ([name]) 10/29/2014   Depression    Diabetes mellitus without complication ([name])    Diabetic peripheral neuropathy ([name])    DVT (deep venous thrombosis) ([name])    Dyspnea    Elevated troponin    a. Chronic elevation.   Fall 05/05/2021   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[name])    Peripheral arterial disease/critical limb ischemia     GERD (gastroesophageal reflux disease)    Hyperlipemia    Hypertension     Hypokalemia    Hypomagnesemia    Hypothyroidism    Marijuana abuse    Narcotic abuse ([name])    Noncompliance    NSVT (nonsustained ventricular tachycardia) ([name])    Obesity    Osteomyelitis ([name]) 06/21/2019   PAF (paroxysmal atrial fibrillation) ([name])    Paroxysmal atrial tachycardia ([name])    Peripheral arterial disease ([name])    a. 01/2015 Angio/PTA: RSFA 99 (atherectomy/pta) - 1 vessel runoff via diff dzs peroneal; b. 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   Pneumonia    "once or twice" (11/26/2018)   Poorly controlled type 2 diabetes mellitus ([name])    Renal disorder    Renal insufficiency    a. 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   SIRS (systemic inflammatory response syndrome) ([name]) 04/06/2017   Sleep apnea    "couldn't handle wearing the mask" (11/26/2018)   Symptomatic bradycardia    a. Avoid AV blocking agent per EP. Prev req temp wire in 2017.   Tobacco abuse    Wrist fracture, left, closed, initial encounter 01/29/2015    Past Surgical History:  Procedure Laterality Date   ABDOMINAL AORTOGRAM N/A 06/26/2019   Procedure: ABDOMINAL AORTOGRAM;  Surgeon: Wenona Hamilton, MD;  Location: [facility];  Service: Cardiovascular;  Laterality: N/A;   ABDOMINAL AORTOGRAM W/LOWER EXTREMITY N/A 06/07/2021   Procedure: ABDOMINAL AORTOGRAM W/LOWER EXTREMITY;  Surgeon: Avanell Leigh, MD;  Location: [facility];  Service: Cardiovascular;  Laterality: N/A;   ABDOMINAL AORTOGRAM W/LOWER EXTREMITY N/A 08/11/2023   Procedure: ABDOMINAL AORTOGRAM W/LOWER EXTREMITY;  Surgeon: Carlene Che, MD;  Location: [facility];  Service: Cardiovascular;  Laterality: N/A;   AMPUTATION Right 06/14/2017   Procedure: Right foot transmetatarsal amputation;  Surgeon: Timothy Ford, MD;  Location[facility] OR;  Service: Orthopedics;  Laterality: Right;   AMPUTATION Left 06/28/2019   Procedure: AMPUTATION LEFT FIFTH TOE;  Surgeon: Mayo Speck, MD;  Location: [facility] OR;  Service:  Vascular;  Laterality: Left;   AMPUTATION TOE Right 04/28/2017   Procedure: AMPUTATION OF RIGHT SECOND RAY;  Surgeon: Timothy Ford, MD;  Location: [facility] OR;  Service: Orthopedics;  Laterality: Right;   CARDIAC CATHETERIZATION     CARDIAC CATHETERIZATION N/A 01/12/2016   Procedure: Temporary Wire;  Surgeon: Eilleen Grates, MD;  [facility];  Service: Cardiovascular;  Laterality: N/A;   CARDIOVERSION  ~ 02/2013   "twice"    CARDIOVERSION N/A 09/26/2023   Procedure: CARDIOVERSION (CATH LAB);  Surgeon: Bridgette Campus, MD;  Location: [facility] INVASIVE CV LAB;  Service: Cardiovascular;  Laterality: N/A;   CAROTID ANGIOGRAM N/A 01/15/2015   Procedure: CAROTID ANGIOGRAM;  Surgeon: Avanell Leigh, MD;  Location: [facility] CATH LAB;  Service: Cardiovascular;  Laterality: N/A;   COLONOSCOPY N/A 03/14/2024   Procedure: COLONOSCOPY;  Surgeon: Nannette Babe,  MD;  Location: [facility] ENDOS[facility];  Service: Gastroenterology;  Laterality: N/A;   DILATION AND CURETTAGE OF UTERUS  1988   ENDARTERECTOMY Left 02/19/2015   Procedure: LEFT CAROTID ENDARTERECTOMY ;  Surgeon: Margherita Shell, MD;  Location: [facility] OR;  Service: Vascular;  Laterality: Left;   ESOPHAGOGASTRODUODENOSCOPY N/A 03/13/2024   Procedure: EGD (ESOPHAGOGASTRODUODENOSCOPY);  Surgeon: Nannette Babe, MD;  Location: [facility] ENDOSCOPY;  Service: Gastroenterology;  Laterality: N/A;   FEMORAL-TIBIAL BYPASS GRAFT Left 06/28/2019   Procedure: BYPASS GRAFT LEFT LEG FEMORAL TO ANTERIOR TIBIAL ARTERY using LEFT GREATER SAPHENOUS VEIN;  Surgeon: Mayo Speck, MD;  Location: [facility] OR;  Service: Vascular;  Laterality: Left;   LEFT HEART CATHETERIZATION WITH CORONARY ANGIOGRAM N/A 10/31/2014   Procedure: LEFT HEART CATHETERIZATION WITH CORONARY ANGIOGRAM;  Surgeon: Odie Benne, MD;  Location: [facility] CATH LAB;  Service: Cardiovascular;  Laterality: N/A;   LOWER EXTREMITY ANGIOGRAM N/A 09/10/2013   Procedure: LOWER EXTREMITY ANGIOGRAM;  Surgeon: Avanell Leigh, MD;  Location: [facility]  CATH LAB;  Service: Cardiovascular;  Laterality: N/A;   LOWER EXTREMITY ANGIOGRAM N/A 01/15/2015   Procedure: LOWER EXTREMITY ANGIOGRAM;  Surgeon: Avanell Leigh, MD;  Location: [facility] CATH LAB;  Service: Cardiovascular;  Laterality: N/A;   LOWER EXTREMITY ANGIOGRAPHY N/A 04/13/2017   Procedure: Lower Extremity Angiography;  Surgeon: Avanell Leigh, MD;  Location: [facility] INVASIVE CV LAB;  Service: Cardiovascular;  Laterality: N/A;   LOWER EXTREMITY ANGIOGRAPHY Left 06/26/2019   Procedure: LOWER EXTREMITY ANGIOGRAPHY;  Surgeon: Wenona Hamilton, MD;  [facility];  Service: Cardiovascular;  Laterality: Left;   PERIPHERAL VASCULAR ATHERECTOMY Right 06/07/2021   Procedure: PERIPHERAL VASCULAR ATHERECTOMY;  Surgeon: Avanell Leigh, MD;  Location: [facility] INVASIVE CV LAB;  Service: Cardiovascular;  Laterality: Right;   PERIPHERAL VASCULAR BALLOON ANGIOPLASTY Left 06/26/2019   Procedure: PERIPHERAL VASCULAR BALLOON ANGIOPLASTY;  Surgeon: Wenona Hamilton, MD;  [facility];  Service: Cardiovascular;  Laterality: Left;  unable to cross lt sfa occlusion   PERIPHERAL VASCULAR INTERVENTION  04/13/2017   Procedure: Peripheral Vascular Intervention;  Surgeon: Avanell Leigh, MD;  Location: [facility] INVASIVE CV LAB;  Service: Cardiovascular;;   PERIPHERAL VASCULAR INTERVENTION  08/11/2023   Procedure: PERIPHERAL VASCULAR INTERVENTION;  Surgeon: Carlene Che, MD;  [facility]: [facility];  Service: Cardiovascular;;   PRESSURE SENSOR/CARDIOMEMS N/A 02/05/2020   Procedure: PRESSURE SENSOR/CARDIOMEMS;  Surgeon: Darlis Eisenmenger, MD;  Location: [facility] INVASIVE CV LAB;  Service: Cardiovascular;  Laterality: N/A;   VEIN HARVEST Left 06/28/2019   Procedure: LEFT LEG GREATER SAPHENOUS VEIN HARVEST;  Surgeon: Mayo Speck, MD;  Location: [facility] OR;  Service: Vascular;  Laterality: Left;    Family History  Problem Relation Age of Onset   Hypertension Mother    Diabetes Mother    Cancer Mother         breast, ovarian, colon   Clotting disorder Mother    Heart disease Mother    Heart attack Mother    Breast cancer Mother        in 45's   Hypertension Father    Heart disease Father    Emphysema Sister        smoked    Social History   Socioeconomic History   Marital status: Single    Spouse name: Not on file   Number of children: 1   Years of education: 64   Highest education level: 12th grade  Occupational History   Occupation: disabled  Tobacco Use   Smoking status: Every Day    Current packs/day: 1.00    Average packs/day: 1 pack/day for 44.0 years (44.0 ttl pk-yrs)    Types: E-cigarettes, Cigarettes   Smokeless tobacco: Former    Types: Snuff  Vaping Use   Vaping status: Former   Devices: 11/26/2018 "stopped months ago"  Substance and Sexual Activity   Alcohol use: Not Currently    Comment: occ   Drug use: Not Currently    Types: "Crack" cocaine, Marijuana, Oxycodone    Sexual activity: Not Currently  Other Topics Concern   Not on file  Social History Narrative   ** Merged History Encounter **       Lives in [place], in motel with sister.  They are looking to move but don't have a place to go yet.     Social Drivers of Corporate investment banker Strain: Low Risk  (02/09/2023)   Overall Financial Resource Strain (CARDIA)    Difficulty of Paying Living Expenses: Not hard at all  Food Insecurity: No Food Insecurity (03/09/2024)   Hunger Vital Sign    Worried About Running Out of Food in the Last Year: Never true    Ran Out of Food in the Last Year: Never true  Transportation Needs: Unmet Transportation Needs (03/09/2024)   PRAPARE - Transportation    Lack of Transportation (Medical): Yes    Lack of Transportation (Non-Medical): Yes  Physical Activity: Inactive (02/09/2023)   Exercise Vital Sign    Days of Exercise per Week: 0 days    Minutes of Exercise per Session: 0 min  Stress: No Stress Concern Present (02/09/2023)   [employer] of Occupational  Health - Occupational Stress Questionnaire    Feeling of Stress : Not at all  Social Connections: Unknown (08/21/2023)   Received from [facility]   Social Network    Social Network: Not on file  Intimate Partner Violence: Not At Risk (03/09/2024)   Humiliation, Afraid, Rape, and Kick questionnaire    Fear of Current or Ex-Partner: No    Emotionally Abused: No    Physically Abused: No    Sexually Abused: No    Allergies  Allergen Reactions   Gabapentin  Nausea And Vomiting and Other (See Comments)    POSSIBLE SHAKING   Lyrica  [Pregabalin ] Other (See Comments)    Shaking     Current Outpatient Medications  Medication Sig Dispense Refill   acetaminophen  (TYLENOL ) 500 MG tablet Take 1,000 mg by mouth every 6 (six) hours as needed for headache (pain).     albuterol  (VENTOLIN  HFA) 108 (90 Base) MCG/ACT inhaler USE 2 PUFFS BY MOUTH EVERY FOUR HOURS, AS NEEDED, FOR COUGHING/WHEEZING 8.5 g 0   allopurinol  (ZYLOPRIM ) 100 MG tablet Take 150 mg by mouth daily.     amiodarone  (PACERONE ) 200 MG tablet Take 1 tablet (200 mg total) by mouth daily. 30 tablet 1   apixaban  (ELIQUIS ) 5 MG TABS tablet Take 1 tablet (5 mg total) by mouth 2 (two) times daily. 60 tablet 6   budesonide -formoterol  (SYMBICORT ) 160-4.5 MCG/ACT inhaler Inhale 2 puffs into the lungs 2 (two) times daily. 1 each 2   buPROPion  (WELLBUTRIN  XL) 300 MG 24 hr tablet Take 300 mg by mouth every morning.     Cholecalciferol  (VITAMIN D3) 50 MCG (2000 UT) capsule Take 1 capsule by mouth every morning.     colchicine  0.6 MG tablet Take 1.2 mg by mouth See admin instructions. Take 1.2 mg for flair up and an  hour later take 0.6 mg if needed     docusate sodium  (COLACE) 100 MG capsule Take 2 capsules (200 mg total) by mouth at bedtime. 10 capsule 0   erythromycin ophthalmic ointment Place 1 Application into the right eye 2 (two) times daily.     fluticasone  (FLONASE) 50 MCG/ACT nasal spray Place 1 spray into both nostrils as needed for  allergies or rhinitis.     folic acid  (FOLVITE ) 1 MG tablet Take 1 tablet (1 mg total) by mouth daily. 30 tablet 0   gabapentin  (NEURONTIN ) 100 MG capsule Take 1 capsule (100 mg total) by mouth 3 (three) times daily. 270 capsule 1   insulin  lispro (HUMALOG ) 100 UNIT/ML KwikPen Inject 15 Units into the skin 3 (three) times daily with meals. Plus sliding scale, max dose 70 units /day 25 mL 1   isosorbide -hydrALAZINE  (BIDIL ) 20-37.5 MG tablet Take 1 tablet by mouth 2 (two) times daily. 90 tablet 0   LANTUS  SOLOSTAR 100 UNIT/ML Solostar Pen Inject 30 Units into the skin daily. 15 mL 0   levothyroxine  (SYNTHROID ) 50 MCG tablet TAKE 1 TABLET (50 MCG TOTAL) BY MOUTH DAILY BEFORE BREAKFAST (AM) 30 tablet 0   Menthol -Camphor (ICY HOT PRO NO MESS EX) Apply 1 Application topically 3 (three) times daily as needed (arms, neuropathy lower extrimity, gout in hand.).     metoprolol  succinate (TOPROL -XL) 25 MG 24 hr tablet Take 1 tablet (25 mg total) by mouth daily. 90 tablet 3   NYAMYC  powder Apply 1 Application topically 2 (two) times daily.     nystatin  cream (MYCOSTATIN ) Apply 1 Application topically 2 (two) times daily as needed for dry skin.     Omega-3 Fatty Acids (FISH OIL PO) Take 1 tablet by mouth daily.     pantoprazole  (PROTONIX ) 40 MG tablet TAKE 1 TABLET (40 MG TOTAL) BY MOUTH DAILY(AM) 30 tablet 0   polyethylene glycol (MIRALAX  / GLYCOLAX ) 17 g packet Take 17 g by mouth daily as needed for moderate constipation. 14 each 0   potassium chloride  SA (KLOR-CON  M) 20 MEQ tablet Take 40meq in am 20 meq in pm 90 tablet 3   rosuvastatin  (CRESTOR ) 40 MG tablet Take 1 tablet (40 mg total) by mouth daily. 90 tablet 3   tirzepatide (MOUNJARO) 7.5 MG/0.5ML Pen Inject 7.5 mg into the skin once a week. 6 mL 0   torsemide  (DEMADEX ) 20 MG tablet Take 4 tablets (80 mg total) by mouth 2 (two) times daily. 200 tablet 6   triamcinolone  ointment (KENALOG ) 0.1 % Apply topically 2 (two) times daily. (Patient taking  differently: Apply 1 Application topically 2 (two) times daily as needed (Skin irritation).) 454 g 0   VITAMIN E PO Take 1 tablet by mouth daily.     No current facility-administered medications for this visit.    PHYSICAL EXAM Vitals:   04/09/24 1542  BP: 137/69  Pulse: 66  Temp: 98.3 F (36.8 C)  SpO2: 96%      Chronically unwell woman.  Obese. No palpable pedal pulses Both feet are warm and well-perfused without ulcer  PERTINENT LABORATORY AND RADIOLOGIC DATA  Most recent CBC    Latest Ref Rng & Units 03/17/2024    8:44 AM 03/16/2024    8:00 AM 03/15/2024   11:16 AM  CBC  WBC 4.0 - 10.5 K/uL 11.8  10.9    Hemoglobin 12.0 - 15.0 g/dL 7.7  7.9  7.5   Hematocrit 36.0 - 46.0 % 24.5  24.2  23.4  Platelets 150 - 400 K/uL 272  265       Most recent CMP    Latest Ref Rng & Units 03/15/2024    6:34 AM 03/14/2024    5:07 AM 03/13/2024    4:59 AM  CMP  Glucose 70 - 99 mg/dL 981  191  478   BUN 8 - 23 mg/dL 31  41  50   Creatinine 0.44 - 1.00 mg/dL 2.95  6.21  3.08   Sodium 135 - 145 mmol/L 136  138  129   Potassium 3.5 - 5.1 mmol/L 3.7  3.6  3.8   Chloride 98 - 111 mmol/L 103  103  101   CO2 22 - 32 mmol/L 24  23  21    Calcium  8.9 - 10.3 mg/dL 8.2  8.5  7.8     Renal function CrCl cannot be calculated (Patient's most recent lab result is older than the maximum 21 days allowed.).  Hemoglobin A1C (no units)  Date Value  06/21/2017 9.8   HbA1c, POC (controlled diabetic range) (%)  Date Value  09/13/2022 9.6 (A)   Hgb A1c MFr Bld (% of total Hgb)  Date Value  01/23/2024 12.6 (H)    LDL Cholesterol (Calc)  Date Value Ref Range Status  02/29/2024 79 mg/dL (calc) Final    Comment:    Reference range: <100 . Desirable range <100 mg/dL for primary prevention;   <70 mg/dL for patients with CHD or diabetic patients  with > or = 2 CHD risk factors. [name] is now calculated using the Martin-Hopkins  calculation, which is a validated novel method providing  better  accuracy than the Friedewald equation in the  estimation of Price.  [name]. 6578;469(62): 2061-2068  (http://education.[facility].com/faq/FAQ164)    Direct LDL  Date Value Ref Range Status  04/04/2022 90.8 0 - 99 mg/dL Final    Comment:    Performed at [facility], [address] [place], [place] [address]     +-------+-----------+-----------+------------+------------+  ABI/TBIToday's ABIToday's TBIPrevious ABIPrevious TBI  +-------+-----------+-----------+------------+------------+  Right 0.66       amputation 0.6         amputation    +-------+-----------+-----------+------------+------------+  Left  0.99       0.70       1.03        0.72          +-------+-----------+-----------+------------+------------+    Karla Little. Edgardo Goodwill, MD FACS [facility] Phone Number: [phone] 04/09/2024 3:45 PM   Total time spent on preparing this encounter including chart review, data review, collecting history, examining the patient, coordinating care for this established patient, 30 minutes  Portions of this report may have been transcribed using voice recognition software.  Every effort has been made to ensure accuracy; however, inadvertent computerized transcription errors may still be present.

## 2024-04-09 NOTE — Telephone Encounter (Signed)
 Contacted [facility] to request the following for refills:  Eliquis  Levothyroxine  Folic acid   Wellbutrin   Metoprolol  Amio  Pantoprazole   Isosorbide   Torsemide     -these are too soon to fill. Will try again next week.    Alfonza Angry, Paramedic [phone] 04/09/24

## 2024-04-10 ENCOUNTER — Ambulatory Visit (INDEPENDENT_AMBULATORY_CARE_PROVIDER_SITE_OTHER): Admitting: "Endocrinology

## 2024-04-10 ENCOUNTER — Encounter: Payer: Self-pay | Admitting: "Endocrinology

## 2024-04-10 VITALS — BP 120/70 | HR 60 | Ht 63.0 in | Wt 216.0 lb

## 2024-04-10 DIAGNOSIS — E782 Mixed hyperlipidemia: Secondary | ICD-10-CM | POA: Diagnosis not present

## 2024-04-10 DIAGNOSIS — E1165 Type 2 diabetes mellitus with hyperglycemia: Secondary | ICD-10-CM | POA: Diagnosis not present

## 2024-04-10 DIAGNOSIS — Z794 Long term (current) use of insulin: Secondary | ICD-10-CM | POA: Diagnosis not present

## 2024-04-10 DIAGNOSIS — Z7985 Long-term (current) use of injectable non-insulin antidiabetic drugs: Secondary | ICD-10-CM | POA: Diagnosis not present

## 2024-04-10 MED ORDER — TIRZEPATIDE 10 MG/0.5ML ~~LOC~~ SOAJ: 10.0000 mg | SUBCUTANEOUS | 0 refills | Status: DC

## 2024-04-10 NOTE — Progress Notes (Signed)
 Outpatient Endocrinology Note Karla Newcomer, MD  04/10/24   [name] Nov 04, 1961 [id number]  Referring Provider: Maryellen Snare, NP Primary Care Provider: Maryellen Snare, NP Reason for consultation: Subjective   Assessment & Plan  There are no diagnoses linked to this encounter.  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, No results found for: "GFR" Hba1c goal less than 7, current Hba1c is  Lab Results  Component Value Date   HGBA1C 12.6 (H) 01/23/2024   Will recommend the following: Lantus  62 units at bedtime Humalog  20 units tid 15 min before meals Correction scale: Use in addition to your meal time/short acting insulin  based on blood sugars as follows: 151 - 180: 1 unit 181 - 210: 2 units 211 - 240: 3 units 241 - 270: 4 units 271 - 300: 5 units 301 - 330: 6 units 331 - 360: 7 units 361 - 390: 8 units 391 - 420: 9 units  Mounjaro 10 mg weekly  Has libre 01/25/24 Ordered DM education   No known contraindications/side effects to any of above medications Glucagon discussed and prescribed with refills on 01/25/24   No history of MEN syndrome/medullary thyroid  cancer/pancreatitis or pancreatic cancer in self or family  -Last LD and Tg are as follows: Lab Results  Component Value Date   LDLCALC 79 02/29/2024    Lab Results  Component Value Date   TRIG 386 (H) 02/29/2024   -On rosuvastatin  40 mg every day, takes one a day fish oil  -Follow low fat diet and exercise   -Blood pressure goal <140/90 - Microalbumin/creatinine goal is < 30 -Last MA/Cr is as follows: Lab Results  Component Value Date   MICROALBUR 9.0 01/23/2024   -on ACE/ARB losartan  25 mg every day  -diet changes including salt restriction -limit eating outside -counseled BP targets per standards of diabetes care -uncontrolled blood pressure can lead to retinopathy, nephropathy and cardiovascular and atherosclerotic heart disease  Reviewed and counseled on: -A1C target -Blood sugar  targets -Complications of uncontrolled diabetes  -Checking blood sugar before meals and bedtime and bring log next visit -All medications with mechanism of action and side effects -Hypoglycemia management: rule of 15's, Glucagon Emergency Kit and medical alert ID -low-carb low-fat plate-method diet -At least 20 minutes of physical activity per day -Annual dilated retinal eye exam and foot exam -compliance and follow up needs -follow up as scheduled or earlier if problem gets worse  Call if blood sugar is less than 70 or consistently above 250    Take a 15 gm snack of carbohydrate at bedtime before you go to sleep if your blood sugar is less than 100.    If you are going to fast after midnight for a test or procedure, ask your physician for instructions on how to reduce/decrease your insulin  dose.    Call if blood sugar is less than 70 or consistently above 250  -Treating a low sugar by rule of 15  (15 gms of sugar every 15 min until sugar is more than 70) If you feel your sugar is low, test your sugar to be sure If your sugar is low (less than 70), then take 15 grams of a fast acting Carbohydrate (3-4 glucose tablets or glucose gel or 4 ounces of juice or regular soda) Recheck your sugar 15 min after treating low to make sure it is more than 70 If sugar is still less than 70, treat again with 15 grams of carbohydrate  Don't drive the hour of hypoglycemia  If unconscious/unable to eat or drink by mouth, use glucagon injection or nasal spray baqsimi and call 911. Can repeat again in 15 min if still unconscious.  No follow-ups on file.   I have reviewed current medications, nurse's notes, allergies, vital signs, past medical and surgical history, family medical history, and social history for this encounter. Counseled patient on symptoms, examination findings, lab findings, imaging results, treatment decisions and monitoring and prognosis. The patient understood the recommendations  and agrees with the treatment plan. All questions regarding treatment plan were fully answered.  Karla Newcomer, MD  04/10/24    History of Present Illness [name] is a 63 y.o. year old female who presents for follow up for Type II diabetes mellitus.  LAFAWN Price was first diagnosed around 2014.   Diabetes education +  Home diabetes regimen: Lantus  56 units at bedtime Humalog  20 units tid 20 min before meals  Mounjaro 7.5 mg/week   COMPLICATIONS -  MI/Stroke +  retinopathy +  neuropathy +  nephropathy  BLOOD SUGAR DATA  CGM interpretation: At today's visit, we reviewed her CGM downloads. The full report is scanned in the media. Reviewing the CGM trends, BG are elevated across the day.  Physical Exam  BP 120/70   Pulse 60   Ht 5\' 3"  (1.6 m)   Wt 216 lb (98 kg)   LMP  (LMP Unknown)   SpO2 98%   BMI 38.26 kg/m    Constitutional: well developed, well nourished Head: normocephalic, atraumatic Eyes: sclera anicteric, no redness Neck: supple Lungs: normal respiratory effort Neurology: alert and oriented Skin: dry, no appreciable rashes Musculoskeletal: no appreciable defects Psychiatric: normal mood and affect Diabetic Foot Exam - Simple   No data filed      Current Medications Patient's Medications  New Prescriptions   TIRZEPATIDE (MOUNJARO) 10 MG/0.5ML PEN    Inject 10 mg into the skin once a week.  Previous Medications   ACETAMINOPHEN  (TYLENOL ) 500 MG TABLET    Take 1,000 mg by mouth every 6 (six) hours as needed for headache (pain).   ALBUTEROL  (VENTOLIN  HFA) 108 (90 BASE) MCG/ACT INHALER    USE 2 PUFFS BY MOUTH EVERY FOUR HOURS, AS NEEDED, FOR COUGHING/WHEEZING   ALLOPURINOL  (ZYLOPRIM ) 100 MG TABLET    Take 150 mg by mouth daily.   AMIODARONE  (PACERONE ) 200 MG TABLET    Take 1 tablet (200 mg total) by mouth daily.   APIXABAN  (ELIQUIS ) 5 MG TABS TABLET    Take 1 tablet (5 mg total) by mouth 2 (two) times daily.   BUDESONIDE -FORMOTEROL  (SYMBICORT )  160-4.5 MCG/ACT INHALER    Inhale 2 puffs into the lungs 2 (two) times daily.   BUPROPION  (WELLBUTRIN  XL) 300 MG 24 HR TABLET    Take 300 mg by mouth every morning.   CHOLECALCIFEROL  (VITAMIN D3) 50 MCG (2000 UT) CAPSULE    Take 1 capsule by mouth every morning.   COLCHICINE  0.6 MG TABLET    Take 1.2 mg by mouth See admin instructions. Take 1.2 mg for flair up and an hour later take 0.6 mg if needed   DOCUSATE SODIUM  (COLACE) 100 MG CAPSULE    Take 2 capsules (200 mg total) by mouth at bedtime.   ERYTHROMYCIN OPHTHALMIC OINTMENT    Place 1 Application into the right eye 2 (two) times daily.   FLUTICASONE  (FLONASE) 50 MCG/ACT NASAL SPRAY    Place 1 spray into both nostrils as needed for  allergies or rhinitis.   FOLIC ACID  (FOLVITE ) 1 MG TABLET    Take 1 tablet (1 mg total) by mouth daily.   GABAPENTIN  (NEURONTIN ) 100 MG CAPSULE    Take 1 capsule (100 mg total) by mouth 3 (three) times daily.   INSULIN  LISPRO (HUMALOG ) 100 UNIT/ML KWIKPEN    Inject 15 Units into the skin 3 (three) times daily with meals. Plus sliding scale, max dose 70 units /day   ISOSORBIDE -HYDRALAZINE  (BIDIL ) 20-37.5 MG TABLET    Take 1 tablet by mouth 2 (two) times daily.   LANTUS  SOLOSTAR 100 UNIT/ML SOLOSTAR PEN    Inject 30 Units into the skin daily.   LEVOTHYROXINE  (SYNTHROID ) 50 MCG TABLET    TAKE 1 TABLET (50 MCG TOTAL) BY MOUTH DAILY BEFORE BREAKFAST (AM)   MENTHOL -CAMPHOR (ICY HOT PRO NO MESS EX)    Apply 1 Application topically 3 (three) times daily as needed (arms, neuropathy lower extrimity, gout in hand.).   METOPROLOL  SUCCINATE (TOPROL -XL) 25 MG 24 HR TABLET    Take 1 tablet (25 mg total) by mouth daily.   NYAMYC  POWDER    Apply 1 Application topically 2 (two) times daily.   NYSTATIN  CREAM (MYCOSTATIN )    Apply 1 Application topically 2 (two) times daily as needed for dry skin.   OMEGA-3 FATTY ACIDS (FISH OIL PO)    Take 1 tablet by mouth daily.   PANTOPRAZOLE  (PROTONIX ) 40 MG TABLET    TAKE 1 TABLET (40 MG TOTAL) BY  MOUTH DAILY(AM)   POLYETHYLENE GLYCOL (MIRALAX  / GLYCOLAX ) 17 G PACKET    Take 17 g by mouth daily as needed for moderate constipation.   POTASSIUM CHLORIDE  SA (KLOR-CON  M) 20 MEQ TABLET    Take 40meq in am 20 meq in pm   ROSUVASTATIN  (CRESTOR ) 40 MG TABLET    Take 1 tablet (40 mg total) by mouth daily.   TORSEMIDE  (DEMADEX ) 20 MG TABLET    Take 4 tablets (80 mg total) by mouth 2 (two) times daily.   TRIAMCINOLONE  OINTMENT (KENALOG ) 0.1 %    Apply topically 2 (two) times daily.   VITAMIN E PO    Take 1 tablet by mouth daily.  Modified Medications   No medications on file  Discontinued Medications   TIRZEPATIDE (MOUNJARO) 7.5 MG/0.5ML PEN    Inject 7.5 mg into the skin once a week.    Allergies Allergies  Allergen Reactions   Gabapentin  Nausea And Vomiting and Other (See Comments)    POSSIBLE SHAKING   Lyrica  [Pregabalin ] Other (See Comments)    Shaking     Past Medical History Past Medical History:  Diagnosis Date   Acute renal failure ([name])    Acute respiratory failure with hypoxia ([name]) 12/02/2019   Alcohol abuse    Alcoholic cirrhosis ([name])    Anemia    Anxiety    Arthritis    "knees" (11/26/2018)   B12 deficiency    CAD (coronary artery disease)    a. 11/10/2014 Cath: LM nl, LAD min irregs, D1 30 ost, D2 50d, LCX 56m, OM1 80 p/m (1.5 mm vessel), OM2 32m, RCA nondom 97m-->med rx.. Demand ischemia in the setting of rapid a-fib.   Cardiomyopathy ([name])    Carotid artery disease ([name])    a. 01/2015 Carotid Angio: RICA 100, LICA 95p; b. 02/2015 s/p L CEA; c. 05/2019 Carotid U/S: RICA 100. RECA >50. LICA 1-39%.   Cellulitis    lower extremities   Cellulitis in diabetic foot ([name]) 07/08/2019  CHF (congestive heart failure) ([name])    Chronic combined systolic and diastolic CHF (congestive heart failure) ([name])    a. 10/2014 Echo: EF 40-45%; b. 10/2018 Echo: EF 45-50%, gr2 DD; c. 11/2019 Echo: EF 50%, mild LVH, gr2 DD (restrictive), antlat HK, Nl RV fxn. Mild BAE. RVSP 59.73mmHg.    CKD (chronic kidney disease), stage III ([name])    Cocaine abuse ([name])    COPD (chronic obstructive pulmonary disease) ([name])    Critical ischemia of foot ([name]) 06/07/2021   Critical limb ischemia     Critical lower limb ischemia ([name])    Demand ischemia ([name]) 10/29/2014   Depression    Diabetes mellitus without complication ([name])    Diabetic peripheral neuropathy ([name])    DVT (deep venous thrombosis) ([name])    Dyspnea    Elevated troponin    a. Chronic elevation.   Fall 05/05/2021   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[name])    Peripheral arterial disease/critical limb ischemia     GERD (gastroesophageal reflux disease)    Hyperlipemia    Hypertension    Hypokalemia    Hypomagnesemia    Hypothyroidism    Marijuana abuse    Narcotic abuse ([name])    Noncompliance    NSVT (nonsustained ventricular tachycardia) ([name])    Obesity    Osteomyelitis ([name]) 06/21/2019   PAF (paroxysmal atrial fibrillation) ([name])    Paroxysmal atrial tachycardia ([name])    Peripheral arterial disease ([name])    a. 01/2015 Angio/PTA: RSFA 99 (atherectomy/pta) - 1 vessel runoff via diff dzs peroneal; b. 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   Pneumonia    "once or twice" (11/26/2018)   Poorly controlled type 2 diabetes mellitus ([name])    Renal disorder    Renal insufficiency    a. 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   SIRS (systemic inflammatory response syndrome) ([name]) 04/06/2017   Sleep apnea    "couldn't handle wearing the mask" (11/26/2018)   Symptomatic bradycardia    a. Avoid AV blocking agent per EP. Prev req temp wire in 2017.   Tobacco abuse    Wrist fracture, left, closed, initial encounter 01/29/2015    Past Surgical History Past Surgical History:  Procedure Laterality Date   ABDOMINAL AORTOGRAM N/A 06/26/2019   Procedure: ABDOMINAL AORTOGRAM;  Surgeon: Wenona Hamilton, MD;  Location: [facility];  Service: Cardiovascular;  Laterality: N/A;   ABDOMINAL AORTOGRAM W/LOWER EXTREMITY  N/A 06/07/2021   Procedure: ABDOMINAL AORTOGRAM W/LOWER EXTREMITY;  Surgeon: Avanell Leigh, MD;  Location: [facility];  Service: Cardiovascular;  Laterality: N/A;   ABDOMINAL AORTOGRAM W/LOWER EXTREMITY N/A 08/11/2023   Procedure: ABDOMINAL AORTOGRAM W/LOWER EXTREMITY;  Surgeon: Carlene Che, MD;  Location: [facility];  Service: Cardiovascular;  Laterality: N/A;   AMPUTATION Right 06/14/2017   Procedure: Right foot transmetatarsal amputation;  Surgeon: Timothy Ford, MD;  Location: [facility] OR;  Service: Orthopedics;  Laterality: Right;   AMPUTATION Left 06/28/2019   Procedure: AMPUTATION LEFT FIFTH TOE;  Surgeon: Mayo Speck, MD;  Location[facility] OR;  Service: Vascular;  Laterality: Left;   AMPUTATION TOE Right 04/28/2017   Procedure: AMPUTATION OF RIGHT SECOND RAY;  Surgeon: Timothy Ford, MD;  Location: [facility] OR;  Service: Orthopedics;  Laterality: Right;   CARDIAC CATHETERIZATION     CARDIAC CATHETERIZATION N/A 01/12/2016   Procedure: Temporary Wire;  Surgeon: Eilleen Grates, MD;  Location: [facility];  Service: Cardiovascular;  Laterality:  N/A;   CARDIOVERSION  ~ 02/2013   "twice"    CARDIOVERSION N/A 09/26/2023   Procedure: CARDIOVERSION (CATH LAB);  Surgeon: Bridgette Campus, MD;  Location: [facility] INVASIVE CV LAB;  Service: Cardiovascular;  Laterality: N/A;   CAROTID ANGIOGRAM N/A 01/15/2015   Procedure: CAROTID ANGIOGRAM;  Surgeon: Avanell Leigh, MD;  Location: [facility] CATH LAB;  Service: Cardiovascular;  Laterality: N/A;   COLONOSCOPY N/A 03/14/2024   Procedure: COLONOSCOPY;  Surgeon: Nannette Babe, MD;  Location: [facility] ENDOSCOPY;  Service: Gastroenterology;  Laterality: N/A;   DILATION AND CURETTAGE OF UTERUS  1988   ENDARTERECTOMY Left 02/19/2015   Procedure: LEFT CAROTID ENDARTERECTOMY ;  Surgeon: Margherita Shell, MD;  Location: [facility] OR;  Service: Vascular;  Laterality: Left;   ESOPHAGOGASTRODUODENOSCOPY N/A 03/13/2024   Procedure: EGD (ESOPHAGOGASTRODUODENOSCOPY);  Surgeon: Nannette Babe, MD;  Location: [facility] ENDOSCOPY;  Service: Gastroenterology;  Laterality: N/A;   FEMORAL-TIBIAL BYPASS GRAFT Left 06/28/2019   Procedure: BYPASS GRAFT LEFT LEG FEMORAL TO ANTERIOR TIBIAL ARTERY using LEFT GREATER SAPHENOUS VEIN;  Surgeon: Mayo Speck, MD;  Location: [facility] OR;  Service: Vascular;  Laterality: Left;   LEFT HEART CATHETERIZATION WITH CORONARY ANGIOGRAM N/A 10/31/2014   Procedure: LEFT HEART CATHETERIZATION WITH CORONARY ANGIOGRAM;  Surgeon: Odie Benne, MD;  Location: [facility] CATH LAB;  Service: Cardiovascular;  Laterality: N/A;   LOWER EXTREMITY ANGIOGRAM N/A 09/10/2013   Procedure: LOWER EXTREMITY ANGIOGRAM;  Surgeon: Avanell Leigh, MD;  Location: [facility] CATH LAB;  Service: Cardiovascular;  Laterality: N/A;   LOWER EXTREMITY ANGIOGRAM N/A 01/15/2015   Procedure: LOWER EXTREMITY ANGIOGRAM;  Surgeon: Avanell Leigh, MD;  Location: [facility] CATH LAB;  Service: Cardiovascular;  Laterality: N/A;   LOWER EXTREMITY ANGIOGRAPHY N/A 04/13/2017   Procedure: Lower Extremity Angiography;  Surgeon: Avanell Leigh, MD;  Location: [facility] INVASIVE CV LAB;  Service: Cardiovascular;  Laterality: N/A;   LOWER EXTREMITY ANGIOGRAPHY Left 06/26/2019   Procedure: LOWER EXTREMITY ANGIOGRAPHY;  Surgeon: Wenona Hamilton, MD;  Location: [facility];  Service: Cardiovascular;  Laterality: Left;   PERIPHERAL VASCULAR ATHERECTOMY Right 06/07/2021   Procedure: PERIPHERAL VASCULAR ATHERECTOMY;  Surgeon: Avanell Leigh, MD;  Location: [facility] INVASIVE CV LAB;  Service: Cardiovascular;  Laterality: Right;   PERIPHERAL VASCULAR BALLOON ANGIOPLASTY Left 06/26/2019   Procedure: PERIPHERAL VASCULAR BALLOON ANGIOPLASTY;  Surgeon: Wenona Hamilton, MD;  Location: [facility];  Service: Cardiovascular;  Laterality: Left;  unable to cross lt sfa occlusion   PERIPHERAL VASCULAR INTERVENTION  04/13/2017   Procedure: Peripheral Vascular Intervention;  Surgeon: Avanell Leigh, MD;  Location: [facility] INVASIVE CV LAB;   Service: Cardiovascular;;   PERIPHERAL VASCULAR INTERVENTION  08/11/2023   Procedure: PERIPHERAL VASCULAR INTERVENTION;  Surgeon: Carlene Che, MD;  Location: [facility];  Service: Cardiovascular;;   PRESSURE SENSOR/CARDIOMEMS N/A 02/05/2020   Procedure: PRESSURE SENSOR/CARDIOMEMS;  Surgeon: Darlis Eisenmenger, MD;  Location: [facility] INVASIVE CV LAB;  Service: Cardiovascular;  Laterality: N/A;   VEIN HARVEST Left 06/28/2019   Procedure: LEFT LEG GREATER SAPHENOUS VEIN HARVEST;  Surgeon: Mayo Speck, MD;  Location: [facility] OR;  Service: Vascular;  Laterality: Left;    Family History family history includes Breast cancer in her mother; Cancer in her mother; Clotting disorder in her mother; Diabetes in her mother; Emphysema in her sister; Heart attack in her mother; Heart disease in her father and mother; Hypertension in her father and mother.  Social History Social History   Socioeconomic History  Marital status: Single    Spouse name: Not on file   Number of children: 1   Years of education: 15   Highest education level: 12th grade  Occupational History   Occupation: disabled  Tobacco Use   Smoking status: Every Day    Current packs/day: 1.00    Average packs/day: 1 pack/day for 44.0 years (44.0 ttl pk-yrs)    Types: E-cigarettes, Cigarettes   Smokeless tobacco: Former    Types: Snuff  Vaping Use   Vaping status: Former   Devices: 11/26/2018 "stopped months ago"  Substance and Sexual Activity   Alcohol use: Not Currently    Comment: occ   Drug use: Not Currently    Types: "Crack" cocaine, Marijuana, Oxycodone    Sexual activity: Not Currently  Other Topics Concern   Not on file  Social History Narrative   ** Merged History Encounter **       Lives in [place], in motel with sister.  They are looking to move but don't have a place to go yet.     Social Drivers of Health   Financial Resource Strain: Low Risk  (02/09/2023)   Overall Financial Resource Strain (CARDIA)     Difficulty of Paying Living Expenses: Not hard at all  Food Insecurity: No Food Insecurity (03/09/2024)   Hunger Vital Sign    Worried About Running Out of Food in the Last Year: Never true    Ran Out of Food in the Last Year: Never true  Transportation Needs: Unmet Transportation Needs (03/09/2024)   PRAPARE - Transportation    Lack of Transportation (Medical): Yes    Lack of Transportation (Non-Medical): Yes  Physical Activity: Inactive (02/09/2023)   Exercise Vital Sign    Days of Exercise per Week: 0 days    Minutes of Exercise per Session: 0 min  Stress: No Stress Concern Present (02/09/2023)   [employer] of Occupational Health - Occupational Stress Questionnaire    Feeling of Stress : Not at all  Social Connections: Unknown (08/21/2023)   Received from [employer]   Social Network    Social Network: Not on file  Intimate Partner Violence: Not At Risk (03/09/2024)   Humiliation, Afraid, Rape, and Kick questionnaire    Fear of Current or Ex-Partner: No    Emotionally Abused: No    Physically Abused: No    Sexually Abused: No    Lab Results  Component Value Date   HGBA1C 12.6 (H) 01/23/2024   HGBA1C 12.0 (H) 09/25/2023   HGBA1C 11.1 (H) 07/27/2023   Lab Results  Component Value Date   CHOL 163 02/29/2024   Lab Results  Component Value Date   HDL 37 (L) 02/29/2024   Lab Results  Component Value Date   LDLCALC 79 02/29/2024   Lab Results  Component Value Date   TRIG 386 (H) 02/29/2024   Lab Results  Component Value Date   CHOLHDL 4.4 02/29/2024   Lab Results  Component Value Date   CREATININE 1.78 (H) 03/15/2024   No results found for: "GFR" Lab Results  Component Value Date   MICROALBUR 9.0 01/23/2024      Component Value Date/Time   NA 136 03/15/2024 0634   NA 140 09/07/2022 1144   K 3.7 03/15/2024 0634   CL 103 03/15/2024 0634   CO2 24 03/15/2024 0634   GLUCOSE 214 (H) 03/15/2024 0634   BUN 31 (H) 03/15/2024 0634   BUN 26 (H) 09/07/2022  1144   CREATININE 1.78 (H) 03/15/2024  1610   CREATININE 2.01 (H) 01/23/2024 1444   CALCIUM  8.2 (L) 03/15/2024 0634   PROT 5.3 (L) 03/09/2024 0514   PROT 6.7 07/07/2022 1155   ALBUMIN  2.6 (L) 03/09/2024 0514   ALBUMIN  4.0 07/07/2022 1155   AST 20 03/09/2024 0514   ALT 11 03/09/2024 0514   ALKPHOS 54 03/09/2024 0514   BILITOT 0.7 03/09/2024 0514   BILITOT <0.2 07/07/2022 1155   GFRNONAA 32 (L) 03/15/2024 0634   GFRAA 39 (L) 06/08/2020 1130      Latest Ref Rng & Units 03/15/2024    6:34 AM 03/14/2024    5:07 AM 03/13/2024    4:59 AM  BMP  Glucose 70 - 99 mg/dL 960  454  098   BUN 8 - 23 mg/dL 31  41  50   Creatinine 0.44 - 1.00 mg/dL 1.19  1.47  8.29   Sodium 135 - 145 mmol/L 136  138  129   Potassium 3.5 - 5.1 mmol/L 3.7  3.6  3.8   Chloride 98 - 111 mmol/L 103  103  101   CO2 22 - 32 mmol/L 24  23  21    Calcium  8.9 - 10.3 mg/dL 8.2  8.5  7.8        Component Value Date/Time   WBC 11.8 (H) 03/17/2024 0844   RBC 2.59 (L) 03/17/2024 0844   HGB 7.7 (L) 03/17/2024 0844   HGB 10.5 (L) 05/25/2021 1444   HCT 24.5 (L) 03/17/2024 0844   HCT 35.5 05/25/2021 1444   PLT 272 03/17/2024 0844   PLT 368 05/25/2021 1444   MCV 94.6 03/17/2024 0844   MCV 88 05/25/2021 1444   MCH 29.7 03/17/2024 0844   MCHC 31.4 03/17/2024 0844   RDW 14.9 03/17/2024 0844   RDW 14.1 05/25/2021 1444   LYMPHSABS 1.6 03/08/2024 1828   LYMPHSABS 1.6 06/20/2019 1125   MONOABS 0.9 03/08/2024 1828   EOSABS 0.2 03/08/2024 1828   EOSABS 0.3 06/20/2019 1125   BASOSABS 0.0 03/08/2024 1828   BASOSABS 0.0 06/20/2019 1125     Parts of this note may have been dictated using voice recognition software. There may be variances in spelling and vocabulary which are unintentional. Not all errors are proofread. Please notify the [facility] if any discrepancies are noted or if the meaning of any statement is not clear.

## 2024-04-10 NOTE — Patient Instructions (Signed)
 Will recommend the following: Mounjaro 10 mg weekly Lantus  62 units at bedtime Humalog  20 units tid 15 min before meals Correction scale: Use in addition to your meal time/short acting insulin  based on blood sugars as follows: 151 - 180: 1 unit 181 - 210: 2 units 211 - 240: 3 units 241 - 270: 4 units 271 - 300: 5 units 301 - 330: 6 units 331 - 360: 7 units 361 - 390: 8 units 391 - 420: 9 units

## 2024-04-11 ENCOUNTER — Other Ambulatory Visit (HOSPITAL_COMMUNITY): Payer: Self-pay

## 2024-04-11 NOTE — Progress Notes (Signed)
 Paramedicine Encounter    Patient ID: [name], female    DOB: Dec 25, 1960, 63 y.o.   MRN: [id number]   Complaints-none presently   Edema-yes but improved   Compliance with meds-yes missed 1 PM dose this week   Pill box filled-yes  If so, by whom-paramedic   Refills needed-yes Eliquis  Levothyroxine  Folic acid   Wellbutrin   Metoprolol  Amio  Pantoprazole   Isosorbide   Torsemide   For the sat am dose she needs amio and folic acid  placed in there. I am off next thur thru the next week and will come back out on Wednesday and fill her up for 2 wks.   Sat pm dose she needs bidil  placed in there  Pt reports she is doing ok. Went to endo doc yesterday-she was moved to sliding scale dosing.  She denies increased sob, no dizziness, no c/p.  Weight is down to 210.  She weighed for me today.   Meds verified and pill box refilled    BP (!) 138/58   Pulse 67   Resp 19   Wt 210 lb (95.3 kg)   LMP  (LMP Unknown)   SpO2 97%   BMI 37.20 kg/m  Weight yesterday--216 @ office  Last visit weight-216   Patient Care Team: Maryellen Snare, NP as PCP - General Katheryne Pane Frederico Jan, MD as PCP - Cardiology (Cardiology) Darlis Eisenmenger, MD as PCP - Advanced Heart Failure (Cardiology) Lei Pump, MD as PCP - Electrophysiology (Cardiology) Avanell Leigh, MD as Consulting Physician (Cardiology) Diamond Formica, MD as Consulting Physician (Pulmonary Disease) System, Provider Not In  Patient Active Problem List   Diagnosis Date Noted   Persistent atrial fibrillation ([name]) 03/15/2024   Benign neoplasm of ascending colon 03/14/2024   Benign neoplasm of transverse colon 03/14/2024   Benign neoplasm of descending colon 03/14/2024   Benign neoplasm of sigmoid colon 03/14/2024   Chronic systolic congestive heart failure ([name]) 03/12/2024   Melena 03/12/2024   Heme positive stool 03/11/2024   GI bleed 03/08/2024   History of CAD (coronary artery disease) 03/08/2024   CKD (chronic  kidney disease), stage IV ([name]) 03/08/2024   Continuous dependence on cigarette smoking 03/08/2024   GAD (generalized anxiety disorder) 03/08/2024   HFrEF (heart failure with reduced ejection fraction) ([name]) 10/11/2023   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) and diastolic (congestive) heart failure ([name]) 10/10/2023   Pre-ulcerative calluses 04/25/2022   PAF (paroxysmal atrial fibrillation) ([name]) 05/05/2021   COPD (chronic obstructive pulmonary disease) ([name]) 05/05/2021   OSA (obstructive sleep apnea) 05/05/2021   Stage 3b chronic kidney disease ([name]) 05/05/2021   Pressure injury of skin 05/04/2021   Diabetic nephropathy ([name]) 01/02/2021   Low back pain 06/01/2020   Hyperlipidemia 04/03/2020   Peripheral artery disease ([name]) 12/26/2019   NICM (nonischemic cardiomyopathy) ([name]) 06/20/2019   Non-healing ulcer ([name]) 06/20/2019   Coagulation disorder ([name]) 08/09/2017   Depression 07/21/2017   At risk for adverse drug reaction 06/20/2017   Peripheral neuropathy 06/20/2017   S/P transmetatarsal amputation of foot, right ([name]) 06/05/2017   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) 05/19/2017   Obesity, class 2 02/24/2016   Anticoagulation management encounter 02/10/2016   Chronic sinus bradycardia 01/12/2016   Essential hypertension 12/22/2015   Demand ischemia ([name])    Acute on chronic diastolic CHF (congestive heart failure) ([name])    Symptomatic anemia 11/08/2015   Hypokalemia 11/08/2015   Tobacco abuse 10/23/2015   Coronary artery disease  DOE (dyspnea on exertion) 04/29/2015   Paroxysmal atrial fibrillation ([name]) 01/16/2015   Carotid artery stenosis 01/16/2015   Insomnia 02/03/2014   S/P peripheral artery angioplasty - TurboHawk atherectomy; R SFA 09/11/2013    Class: Acute   Leg pain, bilateral 08/19/2013   Hypothyroidism 07/31/2013   History of cocaine abuse ([name]) 06/13/2013   Long term current use of  anticoagulant therapy 05/20/2013   Alcohol abuse    Narcotic abuse ([name])    Marijuana abuse    Alcoholic cirrhosis ([name])    Insulin  dependent type 2 diabetes mellitus ([name])     Current Outpatient Medications:    acetaminophen  (TYLENOL ) 500 MG tablet, Take 1,000 mg by mouth every 6 (six) hours as needed for headache (pain)., Disp: , Rfl:    albuterol  (VENTOLIN  HFA) 108 (90 Base) MCG/ACT inhaler, USE 2 PUFFS BY MOUTH EVERY FOUR HOURS, AS NEEDED, FOR COUGHING/WHEEZING, Disp: 8.5 g, Rfl: 0   allopurinol  (ZYLOPRIM ) 100 MG tablet, Take 150 mg by mouth daily., Disp: , Rfl:    amiodarone  (PACERONE ) 200 MG tablet, Take 1 tablet (200 mg total) by mouth daily., Disp: 30 tablet, Rfl: 1   apixaban  (ELIQUIS ) 5 MG TABS tablet, Take 1 tablet (5 mg total) by mouth 2 (two) times daily., Disp: 60 tablet, Rfl: 6   budesonide -formoterol  (SYMBICORT ) 160-4.5 MCG/ACT inhaler, Inhale 2 puffs into the lungs 2 (two) times daily., Disp: 1 each, Rfl: 2   buPROPion  (WELLBUTRIN  XL) 300 MG 24 hr tablet, Take 300 mg by mouth every morning., Disp: , Rfl:    Cholecalciferol  (VITAMIN D3) 50 MCG (2000 UT) capsule, Take 1 capsule by mouth every morning., Disp: , Rfl:    colchicine  0.6 MG tablet, Take 1.2 mg by mouth See admin instructions. Take 1.2 mg for flair up and an hour later take 0.6 mg if needed, Disp: , Rfl:    fluticasone  (FLONASE) 50 MCG/ACT nasal spray, Place 1 spray into both nostrils as needed for allergies or rhinitis., Disp: , Rfl:    folic acid  (FOLVITE ) 1 MG tablet, Take 1 tablet (1 mg total) by mouth daily., Disp: 30 tablet, Rfl: 0   gabapentin  (NEURONTIN ) 100 MG capsule, Take 1 capsule (100 mg total) by mouth 3 (three) times daily., Disp: 270 capsule, Rfl: 1   insulin  lispro (HUMALOG ) 100 UNIT/ML KwikPen, Inject 15 Units into the skin 3 (three) times daily with meals. Plus sliding scale, max dose 70 units /day (Patient taking differently: Inject 20 Units into the skin 3 (three) times daily with meals. Plus  sliding scale, max dose 70 units /day), Disp: 25 mL, Rfl: 1   isosorbide -hydrALAZINE  (BIDIL ) 20-37.5 MG tablet, Take 1 tablet by mouth 2 (two) times daily., Disp: 90 tablet, Rfl: 0   LANTUS  SOLOSTAR 100 UNIT/ML Solostar Pen, Inject 30 Units into the skin daily. (Patient taking differently: Inject 56 Units into the skin daily.), Disp: 15 mL, Rfl: 0   levothyroxine  (SYNTHROID ) 50 MCG tablet, TAKE 1 TABLET (50 MCG TOTAL) BY MOUTH DAILY BEFORE BREAKFAST (AM), Disp: 30 tablet, Rfl: 0   metoprolol  succinate (TOPROL -XL) 25 MG 24 hr tablet, Take 1 tablet (25 mg total) by mouth daily., Disp: 90 tablet, Rfl: 3   Omega-3 Fatty Acids (FISH OIL PO), Take 1 tablet by mouth daily., Disp: , Rfl:    pantoprazole  (PROTONIX ) 40 MG tablet, TAKE 1 TABLET (40 MG TOTAL) BY MOUTH DAILY(AM), Disp: 30 tablet, Rfl: 0   rosuvastatin  (CRESTOR ) 40 MG tablet, Take 1 tablet (40 mg total) by mouth daily. (  Patient taking differently: Take 40 mg by mouth at bedtime.), Disp: 90 tablet, Rfl: 3   tirzepatide (MOUNJARO) 10 MG/0.5ML Pen, Inject 10 mg into the skin once a week., Disp: 2 mL, Rfl: 0   torsemide  (DEMADEX ) 20 MG tablet, Take 4 tablets (80 mg total) by mouth 2 (two) times daily., Disp: 200 tablet, Rfl: 6   triamcinolone  ointment (KENALOG ) 0.1 %, Apply topically 2 (two) times daily. (Patient taking differently: Apply 1 Application topically 2 (two) times daily as needed (Skin irritation).), Disp: 454 g, Rfl: 0   VITAMIN E PO, Take 1 tablet by mouth daily., Disp: , Rfl:    docusate sodium  (COLACE) 100 MG capsule, Take 2 capsules (200 mg total) by mouth at bedtime. (Patient not taking: Reported on 04/11/2024), Disp: 10 capsule, Rfl: 0   erythromycin ophthalmic ointment, Place 1 Application into the right eye 2 (two) times daily., Disp: , Rfl:    Menthol -Camphor (ICY HOT PRO NO MESS EX), Apply 1 Application topically 3 (three) times daily as needed (arms, neuropathy lower extrimity, gout in hand.)., Disp: , Rfl:    NYAMYC  powder,  Apply 1 Application topically 2 (two) times daily., Disp: , Rfl:    nystatin  cream (MYCOSTATIN ), Apply 1 Application topically 2 (two) times daily as needed for dry skin., Disp: , Rfl:    polyethylene glycol (MIRALAX  / GLYCOLAX ) 17 g packet, Take 17 g by mouth daily as needed for moderate constipation., Disp: 14 each, Rfl: 0   potassium chloride  SA (KLOR-CON  M) 20 MEQ tablet, Take 40meq in am 20 meq in pm, Disp: 90 tablet, Rfl: 3 Allergies  Allergen Reactions   Gabapentin  Nausea And Vomiting and Other (See Comments)    POSSIBLE SHAKING   Lyrica  [Pregabalin ] Other (See Comments)    Shaking       Social History   Socioeconomic History   Marital status: Single    Spouse name: Not on file   Number of children: 1   Years of education: 12   Highest education level: 12th grade  Occupational History   Occupation: disabled  Tobacco Use   Smoking status: Every Day    Current packs/day: 1.00    Average packs/day: 1 pack/day for 44.0 years (44.0 ttl pk-yrs)    Types: E-cigarettes, Cigarettes   Smokeless tobacco: Former    Types: Snuff  Vaping Use   Vaping status: Former   Devices: 11/26/2018 "stopped months ago"  Substance and Sexual Activity   Alcohol use: Not Currently    Comment: occ   Drug use: Not Currently    Types: "Crack" cocaine, Marijuana, Oxycodone    Sexual activity: Not Currently  Other Topics Concern   Not on file  Social History Narrative   ** Merged History Encounter **       Lives in [place], in motel with sister.  They are looking to move but don't have a place to go yet.     Social Drivers of Corporate investment banker Strain: Low Risk  (02/09/2023)   Overall Financial Resource Strain (CARDIA)    Difficulty of Paying Living Expenses: Not hard at all  Food Insecurity: No Food Insecurity (03/09/2024)   Hunger Vital Sign    Worried About Running Out of Food in the Last Year: Never true    Ran Out of Food in the Last Year: Never true  Transportation Needs: Unmet  Transportation Needs (03/09/2024)   PRAPARE - Transportation    Lack of Transportation (Medical): Yes    Lack of  Transportation (Non-Medical): Yes  Physical Activity: Inactive (02/09/2023)   Exercise Vital Sign    Days of Exercise per Week: 0 days    Minutes of Exercise per Session: 0 min  Stress: No Stress Concern Present (02/09/2023)   [employer] of Occupational Health - Occupational Stress Questionnaire    Feeling of Stress : Not at all  Social Connections: Unknown (08/21/2023)   Received from [facility]   Social Network    Social Network: Not on file  Intimate Partner Violence: Not At Risk (03/09/2024)   Humiliation, Afraid, Rape, and Kick questionnaire    Fear of Current or Ex-Partner: No    Emotionally Abused: No    Physically Abused: No    Sexually Abused: No    Physical Exam      Future Appointments  Date Time Provider Department Center  04/22/2024 11:30 AM [facility] CANCER SCREENING NURSE VISIT [facility] None  04/22/2024  3:00 PM [facility] PA/NP [facility] None  04/29/2024  2:20 PM Thomasena Fleming, NP [facility] H&V  05/07/2024  3:15 PM Scotece, Leretha Rankin, RD [facility] NDM  05/22/2024  2:20 PM Jorge Newcomer, MD [facility] None  06/17/2024  1:20 PM GI-315 CT 2 GI-315CT GI-[address] WE       Alfonza Angry, Paramedic [phone] [facility] Paramedic  04/11/24

## 2024-04-15 ENCOUNTER — Telehealth (HOSPITAL_COMMUNITY): Payer: Self-pay

## 2024-04-15 NOTE — Telephone Encounter (Signed)
 Contacted [facility] to request the following for refills:   Eliquis  Levothyroxine  Folic acid   Wellbutrin   Metoprolol  Amio  Pantoprazole   Isosorbide   Torsemide   Alfonza Angry, Paramedic [phone] 04/15/24

## 2024-04-16 ENCOUNTER — Other Ambulatory Visit (HOSPITAL_COMMUNITY): Payer: Self-pay | Admitting: *Deleted

## 2024-04-16 ENCOUNTER — Other Ambulatory Visit (HOSPITAL_COMMUNITY): Payer: Self-pay

## 2024-04-16 MED ORDER — ISOSORB DINITRATE-HYDRALAZINE 20-37.5 MG PO TABS: 1.0000 | ORAL_TABLET | Freq: Two times a day (BID) | ORAL | 3 refills | Status: DC

## 2024-04-16 NOTE — Progress Notes (Signed)
 Paramedicine Encounter    Patient ID: [name], female    DOB: 12/20/1960, 63 y.o.   MRN: [id number]   Complaints-leg pain   Edema-yes some but better  Compliance with meds-yes   Pill box filled-yes X 2 wks  If so, by whom-paramedic   Refills needed- Rosuvastatin -needs in 2nd wk pill box - the last rx was for 15 days only and clinic sent in new rx.   Started the mounjaro  higher dose-she has been feeling more GI side effects. I advised her to reach out to doc if this is too intolerable. She got on the scales but she was shaking so badly she had to sit down-the numbers on scales were bouncing between 190s to 210-  She was 210 last week.    Lungs clear. Denies c/p no dizziness. No increased sob.  Reminded her of clinic appoint next week.   Meds verified and pill box for 2 wks refilled--needs rosuvastatin  in 2nd pill box    BP (!) 118/52   Pulse 66   Resp 19   Wt 210 lb (95.3 kg)   LMP  (LMP Unknown)   SpO2 97%   BMI 37.20 kg/m  Weight yesterday-? Last visit weight-210   Patient Care Team: Maryellen Snare, NP as PCP - General Katheryne Pane Frederico Jan, MD as PCP - Cardiology (Cardiology) Darlis Eisenmenger, MD as PCP - Advanced Heart Failure (Cardiology) Lei Pump, MD as PCP - Electrophysiology (Cardiology) Avanell Leigh, MD as Consulting Physician (Cardiology) Diamond Formica, MD as Consulting Physician (Pulmonary Disease) System, Provider Not In  Patient Active Problem List   Diagnosis Date Noted   Persistent atrial fibrillation ([name]) 03/15/2024   Benign neoplasm of ascending colon 03/14/2024   Benign neoplasm of transverse colon 03/14/2024   Benign neoplasm of descending colon 03/14/2024   Benign neoplasm of sigmoid colon 03/14/2024   Chronic systolic congestive heart failure ([name]) 03/12/2024   Melena 03/12/2024   Heme positive stool 03/11/2024   GI bleed 03/08/2024   History of CAD (coronary artery disease) 03/08/2024   CKD (chronic kidney disease),  stage IV ([name]) 03/08/2024   Continuous dependence on cigarette smoking 03/08/2024   GAD (generalized anxiety disorder) 03/08/2024   HFrEF (heart failure with reduced ejection fraction) ([name]) 10/11/2023   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) and diastolic (congestive) heart failure ([name]) 10/10/2023   Pre-ulcerative calluses 04/25/2022   PAF (paroxysmal atrial fibrillation) ([name]) 05/05/2021   COPD (chronic obstructive pulmonary disease) ([name]) 05/05/2021   OSA (obstructive sleep apnea) 05/05/2021   Stage 3b chronic kidney disease ([name]) 05/05/2021   Pressure injury of skin 05/04/2021   Diabetic nephropathy ([name]) 01/02/2021   Low back pain 06/01/2020   Hyperlipidemia 04/03/2020   Peripheral artery disease ([name]) 12/26/2019   NICM (nonischemic cardiomyopathy) ([name]) 06/20/2019   Non-healing ulcer ([name]) 06/20/2019   Coagulation disorder ([name]) 08/09/2017   Depression 07/21/2017   At risk for adverse drug reaction 06/20/2017   Peripheral neuropathy 06/20/2017   S/P transmetatarsal amputation of foot, right ([name]) 06/05/2017   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) 05/19/2017   Obesity, class 2 02/24/2016   Anticoagulation management encounter 02/10/2016   Chronic sinus bradycardia 01/12/2016   Essential hypertension 12/22/2015   Demand ischemia ([name])    Acute on chronic diastolic CHF (congestive heart failure) ([name])    Symptomatic anemia 11/08/2015   Hypokalemia 11/08/2015   Tobacco abuse 10/23/2015   Coronary artery disease  DOE (dyspnea on exertion) 04/29/2015   Paroxysmal atrial fibrillation ([name]) 01/16/2015   Carotid artery stenosis 01/16/2015   Insomnia 02/03/2014   S/P peripheral artery angioplasty - TurboHawk atherectomy; R SFA 09/11/2013    Class: Acute   Leg pain, bilateral 08/19/2013   Hypothyroidism 07/31/2013   History of cocaine abuse ([name]) 06/13/2013   Long term current use of anticoagulant therapy  05/20/2013   Alcohol abuse    Narcotic abuse ([name])    Marijuana abuse    Alcoholic cirrhosis ([name])    Insulin  dependent type 2 diabetes mellitus ([name])     Current Outpatient Medications:    acetaminophen  (TYLENOL ) 500 MG tablet, Take 1,000 mg by mouth every 6 (six) hours as needed for headache (pain)., Disp: , Rfl:    albuterol  (VENTOLIN  HFA) 108 (90 Base) MCG/ACT inhaler, USE 2 PUFFS BY MOUTH EVERY FOUR HOURS, AS NEEDED, FOR COUGHING/WHEEZING, Disp: 8.5 g, Rfl: 0   allopurinol  (ZYLOPRIM ) 100 MG tablet, Take 150 mg by mouth daily., Disp: , Rfl:    amiodarone  (PACERONE ) 200 MG tablet, Take 1 tablet (200 mg total) by mouth daily., Disp: 30 tablet, Rfl: 1   apixaban  (ELIQUIS ) 5 MG TABS tablet, Take 1 tablet (5 mg total) by mouth 2 (two) times daily., Disp: 60 tablet, Rfl: 6   budesonide -formoterol  (SYMBICORT ) 160-4.5 MCG/ACT inhaler, Inhale 2 puffs into the lungs 2 (two) times daily., Disp: 1 each, Rfl: 2   buPROPion  (WELLBUTRIN  XL) 300 MG 24 hr tablet, Take 300 mg by mouth every morning., Disp: , Rfl:    Cholecalciferol  (VITAMIN D3) 50 MCG (2000 UT) capsule, Take 1 capsule by mouth every morning., Disp: , Rfl:    colchicine  0.6 MG tablet, Take 1.2 mg by mouth See admin instructions. Take 1.2 mg for flair up and an hour later take 0.6 mg if needed, Disp: , Rfl:    fluticasone  (FLONASE) 50 MCG/ACT nasal spray, Place 1 spray into both nostrils as needed for allergies or rhinitis., Disp: , Rfl:    folic acid  (FOLVITE ) 1 MG tablet, Take 1 tablet (1 mg total) by mouth daily., Disp: 30 tablet, Rfl: 0   gabapentin  (NEURONTIN ) 100 MG capsule, Take 1 capsule (100 mg total) by mouth 3 (three) times daily., Disp: 270 capsule, Rfl: 1   isosorbide -hydrALAZINE  (BIDIL ) 20-37.5 MG tablet, Take 1 tablet by mouth 2 (two) times daily., Disp: 90 tablet, Rfl: 0   LANTUS  SOLOSTAR 100 UNIT/ML Solostar Pen, Inject 30 Units into the skin daily. (Patient taking differently: Inject 56 Units into the skin daily.), Disp: 15  mL, Rfl: 0   levothyroxine  (SYNTHROID ) 50 MCG tablet, TAKE 1 TABLET (50 MCG TOTAL) BY MOUTH DAILY BEFORE BREAKFAST (AM), Disp: 30 tablet, Rfl: 0   metoprolol  succinate (TOPROL -XL) 25 MG 24 hr tablet, Take 1 tablet (25 mg total) by mouth daily., Disp: 90 tablet, Rfl: 3   Omega-3 Fatty Acids (FISH OIL PO), Take 1 tablet by mouth daily., Disp: , Rfl:    pantoprazole  (PROTONIX ) 40 MG tablet, TAKE 1 TABLET (40 MG TOTAL) BY MOUTH DAILY(AM), Disp: 30 tablet, Rfl: 0   potassium chloride  SA (KLOR-CON  M) 20 MEQ tablet, Take 40meq in am 20 meq in pm, Disp: 90 tablet, Rfl: 3   rosuvastatin  (CRESTOR ) 40 MG tablet, Take 1 tablet (40 mg total) by mouth daily. (Patient taking differently: Take 40 mg by mouth at bedtime.), Disp: 90 tablet, Rfl: 3   tirzepatide  (MOUNJARO ) 10 MG/0.5ML Pen, Inject 10 mg into the skin once a week., Disp: 2  mL, Rfl: 0   torsemide  (DEMADEX ) 20 MG tablet, Take 4 tablets (80 mg total) by mouth 2 (two) times daily., Disp: 200 tablet, Rfl: 6   triamcinolone  ointment (KENALOG ) 0.1 %, Apply topically 2 (two) times daily. (Patient taking differently: Apply 1 Application topically 2 (two) times daily as needed (Skin irritation).), Disp: 454 g, Rfl: 0   VITAMIN E PO, Take 1 tablet by mouth daily., Disp: , Rfl:    docusate sodium  (COLACE) 100 MG capsule, Take 2 capsules (200 mg total) by mouth at bedtime. (Patient not taking: Reported on 04/16/2024), Disp: 10 capsule, Rfl: 0   erythromycin ophthalmic ointment, Place 1 Application into the right eye 2 (two) times daily., Disp: , Rfl:    insulin  lispro (HUMALOG ) 100 UNIT/ML KwikPen, Inject 15 Units into the skin 3 (three) times daily with meals. Plus sliding scale, max dose 70 units /day (Patient taking differently: Inject 20 Units into the skin 3 (three) times daily with meals. Plus sliding scale, max dose 70 units /day), Disp: 25 mL, Rfl: 1   Menthol -Camphor (ICY HOT PRO NO MESS EX), Apply 1 Application topically 3 (three) times daily as needed (arms,  neuropathy lower extrimity, gout in hand.)., Disp: , Rfl:    NYAMYC  powder, Apply 1 Application topically 2 (two) times daily., Disp: , Rfl:    nystatin  cream (MYCOSTATIN ), Apply 1 Application topically 2 (two) times daily as needed for dry skin., Disp: , Rfl:    polyethylene glycol (MIRALAX  / GLYCOLAX ) 17 g packet, Take 17 g by mouth daily as needed for moderate constipation., Disp: 14 each, Rfl: 0 Allergies  Allergen Reactions   Gabapentin  Nausea And Vomiting and Other (See Comments)    POSSIBLE SHAKING   Lyrica  [Pregabalin ] Other (See Comments)    Shaking       Social History   Socioeconomic History   Marital status: Single    Spouse name: Not on file   Number of children: 1   Years of education: 12   Highest education level: 12th grade  Occupational History   Occupation: disabled  Tobacco Use   Smoking status: Every Day    Current packs/day: 1.00    Average packs/day: 1 pack/day for 44.0 years (44.0 ttl pk-yrs)    Types: E-cigarettes, Cigarettes   Smokeless tobacco: Former    Types: Snuff  Vaping Use   Vaping status: Former   Devices: 11/26/2018 "stopped months ago"  Substance and Sexual Activity   Alcohol use: Not Currently    Comment: occ   Drug use: Not Currently    Types: "Crack" cocaine, Marijuana, Oxycodone    Sexual activity: Not Currently  Other Topics Concern   Not on file  Social History Narrative   ** Merged History Encounter **       Lives in [place], in motel with sister.  They are looking to move but don't have a place to go yet.     Social Drivers of Corporate investment banker Strain: Low Risk  (02/09/2023)   Overall Financial Resource Strain (CARDIA)    Difficulty of Paying Living Expenses: Not hard at all  Food Insecurity: No Food Insecurity (03/09/2024)   Hunger Vital Sign    Worried About Running Out of Food in the Last Year: Never true    Ran Out of Food in the Last Year: Never true  Transportation Needs: Unmet Transportation Needs (03/09/2024)    PRAPARE - Transportation    Lack of Transportation (Medical): Yes    Lack of  Transportation (Non-Medical): Yes  Physical Activity: Inactive (02/09/2023)   Exercise Vital Sign    Days of Exercise per Week: 0 days    Minutes of Exercise per Session: 0 min  Stress: No Stress Concern Present (02/09/2023)   [employer] of Occupational Health - Occupational Stress Questionnaire    Feeling of Stress : Not at all  Social Connections: Unknown (08/21/2023)   Received from [facility]   Social Network    Social Network: Not on file  Intimate Partner Violence: Not At Risk (03/09/2024)   Humiliation, Afraid, Rape, and Kick questionnaire    Fear of Current or Ex-Partner: No    Emotionally Abused: No    Physically Abused: No    Sexually Abused: No    Physical Exam      Future Appointments  Date Time Provider Department Center  04/22/2024 11:30 AM [facility] CANCER SCREENING NURSE VISIT [facility] None  04/22/2024  3:00 PM [facility] PA/NP [facility] None  04/29/2024  2:20 PM Thomasena Fleming, NP [facility] H&V  05/07/2024  3:15 PM Scotece, Leretha Rankin, RD [facility] NDM  05/22/2024  2:20 PM Jorge Newcomer, MD [facility] None  06/17/2024  1:20 PM GI-315 CT 2 GI-315CT GI-[address]       Alfonza Angry, Paramedic [phone] [facility] Paramedic  04/16/24

## 2024-04-22 ENCOUNTER — Encounter (HOSPITAL_COMMUNITY): Payer: Self-pay

## 2024-04-22 ENCOUNTER — Telehealth: Payer: Self-pay

## 2024-04-22 ENCOUNTER — Ambulatory Visit (HOSPITAL_COMMUNITY)
Admission: RE | Admit: 2024-04-22 | Discharge: 2024-04-22 | Disposition: A | Discharge status: Home / Self Care | Source: Ambulatory Visit | Attending: Family Medicine | Admitting: Family Medicine

## 2024-04-22 ENCOUNTER — Encounter: Payer: Self-pay | Admitting: Emergency Medicine

## 2024-04-22 ENCOUNTER — Encounter

## 2024-04-22 VITALS — BP 110/70 | HR 70 | Wt 223.4 lb

## 2024-04-22 DIAGNOSIS — E669 Obesity, unspecified: Secondary | ICD-10-CM | POA: Insufficient documentation

## 2024-04-22 DIAGNOSIS — I739 Peripheral vascular disease, unspecified: Secondary | ICD-10-CM | POA: Diagnosis not present

## 2024-04-22 DIAGNOSIS — I10 Essential (primary) hypertension: Secondary | ICD-10-CM

## 2024-04-22 DIAGNOSIS — E1122 Type 2 diabetes mellitus with diabetic chronic kidney disease: Secondary | ICD-10-CM

## 2024-04-22 DIAGNOSIS — F1011 Alcohol abuse, in remission: Secondary | ICD-10-CM | POA: Insufficient documentation

## 2024-04-22 DIAGNOSIS — J449 Chronic obstructive pulmonary disease, unspecified: Secondary | ICD-10-CM

## 2024-04-22 DIAGNOSIS — Z6839 Body mass index (BMI) 39.0-39.9, adult: Secondary | ICD-10-CM | POA: Insufficient documentation

## 2024-04-22 DIAGNOSIS — Z7901 Long term (current) use of anticoagulants: Secondary | ICD-10-CM | POA: Insufficient documentation

## 2024-04-22 DIAGNOSIS — F17210 Nicotine dependence, cigarettes, uncomplicated: Secondary | ICD-10-CM | POA: Insufficient documentation

## 2024-04-22 DIAGNOSIS — I480 Paroxysmal atrial fibrillation: Secondary | ICD-10-CM | POA: Diagnosis not present

## 2024-04-22 DIAGNOSIS — F1411 Cocaine abuse, in remission: Secondary | ICD-10-CM | POA: Insufficient documentation

## 2024-04-22 DIAGNOSIS — Z794 Long term (current) use of insulin: Secondary | ICD-10-CM | POA: Diagnosis not present

## 2024-04-22 DIAGNOSIS — I2510 Atherosclerotic heart disease of native coronary artery without angina pectoris: Secondary | ICD-10-CM

## 2024-04-22 DIAGNOSIS — N184 Chronic kidney disease, stage 4 (severe): Secondary | ICD-10-CM

## 2024-04-22 DIAGNOSIS — E1151 Type 2 diabetes mellitus with diabetic peripheral angiopathy without gangrene: Secondary | ICD-10-CM | POA: Insufficient documentation

## 2024-04-22 DIAGNOSIS — G4733 Obstructive sleep apnea (adult) (pediatric): Secondary | ICD-10-CM | POA: Insufficient documentation

## 2024-04-22 DIAGNOSIS — R001 Bradycardia, unspecified: Secondary | ICD-10-CM | POA: Diagnosis present

## 2024-04-22 DIAGNOSIS — I5022 Chronic systolic (congestive) heart failure: Secondary | ICD-10-CM | POA: Diagnosis present

## 2024-04-22 DIAGNOSIS — I5020 Unspecified systolic (congestive) heart failure: Secondary | ICD-10-CM

## 2024-04-22 DIAGNOSIS — Z9889 Other specified postprocedural states: Secondary | ICD-10-CM | POA: Diagnosis not present

## 2024-04-22 DIAGNOSIS — E1165 Type 2 diabetes mellitus with hyperglycemia: Secondary | ICD-10-CM | POA: Diagnosis not present

## 2024-04-22 DIAGNOSIS — E785 Hyperlipidemia, unspecified: Secondary | ICD-10-CM

## 2024-04-22 LAB — BRAIN NATRIURETIC PEPTIDE: B Natriuretic Peptide: 482.1 pg/mL — ABNORMAL HIGH (ref 0.0–100.0)

## 2024-04-22 LAB — BASIC METABOLIC PANEL WITH GFR
Anion gap: 11 (ref 5–15)
BUN: 64 mg/dL — ABNORMAL HIGH (ref 8–23)
CO2: 25 mmol/L (ref 22–32)
Calcium: 8 mg/dL — ABNORMAL LOW (ref 8.9–10.3)
Chloride: 100 mmol/L (ref 98–111)
Creatinine, Ser: 2.4 mg/dL — ABNORMAL HIGH (ref 0.44–1.00)
GFR, Estimated: 22 mL/min — ABNORMAL LOW (ref >60)
Glucose, Bld: 171 mg/dL — ABNORMAL HIGH (ref 70–99)
Potassium: 4.6 mmol/L (ref 3.5–5.1)
Sodium: 136 mmol/L (ref 135–145)

## 2024-04-22 MED ORDER — TORSEMIDE 20 MG PO TABS
100.0000 mg | ORAL_TABLET | Freq: Two times a day (BID) | ORAL | 6 refills | Status: DC
Start: 2024-04-22 — End: 2024-05-07

## 2024-04-22 MED ORDER — POTASSIUM CHLORIDE CRYS ER 20 MEQ PO TBCR: 40.0000 meq | EXTENDED_RELEASE_TABLET | Freq: Two times a day (BID) | ORAL | 3 refills | Status: DC

## 2024-04-22 NOTE — Telephone Encounter (Signed)
 Copied from CRM [id number]. Topic: General - Other >> Apr 22, 2024 11:45 AM Margarette Shawl wrote: Reason for CRM:   Pt's sister, Seabron Cypress, is contacting clinic to notify pt's cellphone is not working and that is why she did not answer for telephone visit at 11:30 AM today.  Sister provided her cell number # [phone], and requested call back to complete visit.  ATC x1 LVM on sister's phone to have patient call our office back to reschedule her telephone visit for shared Decision visit.

## 2024-04-22 NOTE — Patient Instructions (Addendum)
 Thank you for coming in today  If you had labs drawn today, any labs that are abnormal the clinic will call you No news is good news  Medications: Increase Torsemide  to 100 mg 5 tablets twice daily Increase Potassium 40 meq 2 tablets twice daily  Follow up appointments: Your physician recommends that you return for lab work in: 2 weeks in clinic  Your physician recommends that you schedule a follow-up appointment in:  4 weeks in clinic   Do the following things EVERYDAY: Weigh yourself in the morning before breakfast. Write it down and keep it in a log. Take your medicines as prescribed Eat low salt foods--Limit salt (sodium) to 2000 mg per day.  Stay as active as you can everyday Limit all fluids for the day to less than 2 liters   At the [facility], you and your health needs are our priority. As part of our continuing mission to provide you with exceptional heart care, we have created designated Provider Care Teams. These Care Teams include your primary Cardiologist (physician) and Advanced Practice Providers (APPs- Physician Assistants and Nurse Practitioners) who all work together to provide you with the care you need, when you need it.   You may see any of the following providers on your designated Care Team at your next follow up: Dr Jules Oar Dr Peder Bourdon Dr. Mimi Alt, NP Ruddy Corral, [place] [facility] [place], [place] Dennise Fitz, NP Luster Salters, PharmD   Please be sure to bring in all your medications bottles to every appointment.    Thank you for choosing [facility]  If you have any questions or concerns before your next appointment please send us  a message through [place] or call our office at [phone].    TO LEAVE A MESSAGE FOR THE NURSE SELECT OPTION 2, PLEASE LEAVE A MESSAGE INCLUDING: YOUR NAME DATE OF BIRTH CALL BACK NUMBER REASON FOR CALL**this is  important as we prioritize the call backs  YOU WILL RECEIVE A CALL BACK THE SAME DAY AS LONG AS YOU CALL BEFORE 4:00 PM

## 2024-04-22 NOTE — Progress Notes (Addendum)
 Patient ID: [name], female   DOB: 09-26-1961, 63 y.o.   MRN: [id number]    [facility] Note   PCP: [facility] PV: Dr. Court HF Cardiology: Dr. Rolan  HPI: [name] is a 63 y.o. with history of PAD, carotid stenosis s/p left CEA, CAD, chronic systolic CHF, paroxysmal atrial fibrillation, bradycardia, and prior substance abuse.   In 5/18, she had peripheral angiography with atherectomy of right SFA.  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 In 8/20, she had peripheral angiography showing totally occluded left SFA.  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 Oris.    Echo 1/21 EF 50%, mild LVH, normal RV, PASP 60 mmHg.  Echo 4/22 EF 55-60% with basal to mid inferolateral hypokinesis.   Underwent PV angiogram with Dr. Court 7/22. She had drug-coated balloon angioplasty of the right SFA after being admitted for critical ischemia of the right foot.  Repeat peripheral arterial dopplers in 8/22 still showed severe right SFA disease.   Echo 4/24 EF 50-55%, RV normal.  Admitted 9/24 with UTI/sepsis. Required ICU care, BiPap and abx. Underwent RLE angiogram with femoropopliteal angioplasty and stenting.  Two admissions in 11/24 for AF/RVR s/p DCCV. EF was 45-50%, nl RV. She was d/c'd on Torsemide  100 bid w/o weekly metolazone  and readmitted for volume overload. She was d/c'd with prn Metolazone , however CardioMems reading (PA 31) suggested she was not diuresed at discharge. Was instructed to use Furosicx at home. At follow up PA 36, she was switched back to Torsemide  with metolazone .  Admitted 4/24 for GIB and ABLA. Hgb 5.3 on admission. Transfused. Eliquis  held. Underwent endoscopy 4/30: Esophageal plaques were found consistent with candidiasis.  Normal stomach. Started on fluconazole .   Normal examined duodenal. No source of bleeding on endoscopy, underwent colonoscopy, multiples polyps  found and had polypectomy. GI recommended capsule endoscopy in the future if recurrence of bleeding. Resumed eliquis   5/03.  Today she returns to clinic for HF follow up. Overall feeling fine. Main complaint is shoulder pain and her glucose monitor broke. She uses a walker to walk short distances around her house. Has LE swelling. Denies palpitations, abnormal bleeding, CP, dizziness, or PND/Orthopnea. Appetite ok. Weight at home 210-225 pounds. Taking all medications. Follows with paramedicine, uses Cardiomems. Smoking 1/2 ppd.  Cardiomems goal 20, today's reading  26  Labs (8/23): K 4.7, creatinine 2.11, LDL 93, TGs 315 Labs (10/23): K 3.4, creatinine 1.35 Labs (1/24): K 4.0, creatinine 3.58 Labs (9/24): K 3.8, creatinine 1.44 Labs (11/24): K 4, creatinine 1.59 Labs (11/24): 4.2, creatinine 1.28  Labs (5/25): K 3.7, creatinine 1.78   PMH: 1. Carotid stenosis: Known occluded right carotid.  Left CEA in 4/16.  - Carotid dopplers (7/21): CTO RICA, mild disease LICA.  - Carotid dopplers (7/22): CTO RICA, 1-39% LICA.  - Carotid dopplers (3/23): Totally occluded RICA, 1-39% LICA.  2. CAD: LHC in 12/15 with 80% stenosis in small OM1, nonobstructive disease in other territories.  3. Chronic systolic CHF: Nonischemic cardiomyopathy (?due to ETOH or prior drug abuse).  She has a Cardiomems.  - Echo (1/17) with EF 45%, mild LV hypertrophy, moderate diastolic dysfunction, inferolateral severe hypokinesis, mildly decreased RV systolic function.  - Echo (1/18): EF 40-45%, mild LVH, normal RV size and systolic function.  - Echo (1/21): EF 50%, mild LVH, normal RV, PASP 60 mmHg.  - Echo (4/22): EF 55-60%, basal-mid  inferolateral hypokinesis with grade II diastolic dysfunction, RV normal, PASP 42 - Echo (4/24): EF 50-55%.  - Echo (11/24): EF 45-50%, normal RV, mild MR, anterolateral akinesis. 4. Atrial fibrillation: Paroxysmal.   - DCCV 11/24 5. Type II diabetes 6. CKD: Stage III. 7. COPD: Smokes  1/4-1/2 ppd.  8. 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  No longer drinks.  9. Hypothyroidism 10. PAD: Atherectomy SFA in 2014 (Dr Court).  Peripheral arterial dopplers (2/17) with focal 75-99% proximal right SFA stenosis, occluded mid-distal right SFA, chronic occlusion of all runoff arteries on right. - In 5/18, she had peripheral angiography with atherectomy of right SFA.   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  - 7/18 ABIs showed 0.65 on right, 0.31 on left.   - Peripheral angiography 8/20: Totally occluded left SFA.  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 Oris.   - ABIs (2/21): 0.44 R, 0.99 L.  - Balloon angioplasty right SFA 7/22.  - ABIs (8/22): severe right SFA disease.  - peripheral arterial dopplers (2/23): 50-74% right SFA and right popliteal. Stable.  - s/p RLE angiogram with femoropopliteal angioplasty and stenting 9/24 - ABIs (11/24): 0.6 on right, 1.03 on left.  11. Anemia 12. Prior cocaine abuse.  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  14. OSA: Has not been able to tolerate CPAP.  15. Diabetic neuropathy.    SH: Lives with sister.  Prior cocaine abuse.  Prior ETOH abuse.  Quit smoking in 1/17, restarted, and quit again in 4/19, restarted again.   FH: CAD  Review of systems complete and found to be negative unless listed in HPI.    Current Outpatient Medications  Medication Sig Dispense Refill   acetaminophen  (TYLENOL ) 500 MG tablet Take 1,000 mg by mouth every 6 (six) hours as needed for headache (pain).     albuterol  (VENTOLIN  HFA) 108 (90 Base) MCG/ACT inhaler USE 2 PUFFS BY MOUTH EVERY FOUR HOURS, AS NEEDED, FOR COUGHING/WHEEZING 8.5 g 0   allopurinol  (ZYLOPRIM ) 100 MG tablet Take 150 mg by mouth daily.     amiodarone  (PACERONE ) 200 MG tablet Take 1 tablet (200 mg total) by mouth daily. 30 tablet 1   apixaban  (ELIQUIS ) 5 MG TABS tablet Take 1 tablet  (5 mg total) by mouth 2 (two) times daily. 60 tablet 6   budesonide -formoterol  (SYMBICORT ) 160-4.5 MCG/ACT inhaler Inhale 2 puffs into the lungs 2 (two) times daily. 1 each 2   buPROPion  (WELLBUTRIN  XL) 300 MG 24 hr tablet Take 300 mg by mouth every morning.     Cholecalciferol  (VITAMIN D3) 50 MCG (2000 UT) capsule Take 1 capsule by mouth every morning.     colchicine  0.6 MG tablet Take 1.2 mg by mouth See admin instructions. Take 1.2 mg for flair up and an hour later take 0.6 mg if needed     docusate sodium  (COLACE) 100 MG capsule Take 2 capsules (200 mg total) by mouth at bedtime. 10 capsule 0   erythromycin ophthalmic ointment Place 1 Application into the right eye 2 (two) times daily.     fluticasone  (FLONASE) 50 MCG/ACT nasal spray Place 1 spray into both nostrils as needed for allergies or rhinitis.     folic acid  (FOLVITE ) 1 MG tablet Take 1 tablet (1 mg total) by mouth daily. 30 tablet 0   gabapentin  (NEURONTIN ) 100 MG capsule Take 1 capsule (100 mg total) by mouth  3 (three) times daily. 270 capsule 1   insulin  lispro (HUMALOG ) 100 UNIT/ML KwikPen Inject 15 Units into the skin 3 (three) times daily with meals. Plus sliding scale, max dose 70 units /day 25 mL 1   isosorbide -hydrALAZINE  (BIDIL ) 20-37.5 MG tablet Take 1 tablet by mouth 2 (two) times daily. 180 tablet 3   LANTUS  SOLOSTAR 100 UNIT/ML Solostar Pen Inject 30 Units into the skin daily. 15 mL 0   levothyroxine  (SYNTHROID ) 50 MCG tablet TAKE 1 TABLET (50 MCG TOTAL) BY MOUTH DAILY BEFORE BREAKFAST (AM) 30 tablet 0   Menthol -Camphor (ICY HOT PRO NO MESS EX) Apply 1 Application topically 3 (three) times daily as needed (arms, neuropathy lower extrimity, gout in hand.).     metoprolol  succinate (TOPROL -XL) 25 MG 24 hr tablet Take 1 tablet (25 mg total) by mouth daily. 90 tablet 3   NYAMYC  powder Apply 1 Application topically 2 (two) times daily.     nystatin  cream (MYCOSTATIN ) Apply 1 Application topically 2 (two) times daily as needed  for dry skin.     Omega-3 Fatty Acids (FISH OIL PO) Take 1 tablet by mouth daily.     pantoprazole  (PROTONIX ) 40 MG tablet TAKE 1 TABLET (40 MG TOTAL) BY MOUTH DAILY(AM) 30 tablet 0   polyethylene glycol (MIRALAX  / GLYCOLAX ) 17 g packet Take 17 g by mouth daily as needed for moderate constipation. 14 each 0   potassium chloride  SA (KLOR-CON  M) 20 MEQ tablet Take 40meq in am 20 meq in pm 90 tablet 3   rosuvastatin  (CRESTOR ) 40 MG tablet Take 1 tablet (40 mg total) by mouth daily. 90 tablet 3   tirzepatide  (MOUNJARO ) 10 MG/0.5ML Pen Inject 10 mg into the skin once a week. 2 mL 0   torsemide  (DEMADEX ) 20 MG tablet Take 4 tablets (80 mg total) by mouth 2 (two) times daily. 200 tablet 6   triamcinolone  ointment (KENALOG ) 0.1 % Apply topically 2 (two) times daily. 454 g 0   VITAMIN E PO Take 1 tablet by mouth daily.     No current facility-administered medications for this encounter.   BP 110/70   Pulse 70   Wt 101.3 kg (223 lb 6.4 oz)   LMP  (LMP Unknown)   SpO2 96%   BMI 39.57 kg/m   Wt Readings from Last 3 Encounters:  04/22/24 101.3 kg (223 lb 6.4 oz)  04/16/24 95.3 kg (210 lb)  04/11/24 95.3 kg (210 lb)   PHYSICAL EXAM: General:  NAD. No resp difficulty, arrived in [facility], chronically-ill appearing HEENT: Normal Neck: Supple. No JVD. Cor: Regular rate & rhythm. No rubs, gallops or murmurs. Lungs: Clear, diminished in bases. Abdomen: Soft, obese, nontender, nondistended.  Extremities: No cyanosis, clubbing, rash,2-3+ BLE pre-tibial edema + weeping Neuro: Alert & oriented x 3, moves all 4 extremities w/o difficulty. Affect pleasant.  Assessment/Plan: 1. Chronic HF with mid range EF: LHC in 12/15 showing only 80% stenosis in small OM1. EF as low as 40-45% in the past. Echo 4/22 showed EF 55-60% with grade 2 diastolic dysfunction.  Echo (4/24) EF 50-55%.  Echo in 11/24 with EF 45-50%, normal RV, mild MR, anterolateral akinesis.  Chronically NYHA class III, worsened by body habitus and  knee pain. She is volume overloaded by Cardiomoms and by exam. GDMT limited by cardio-renal syndrome. - Arrange for [employer] with weeping to lower extremities - Increase torsemide  to 100 mg bid, increase KCL to 40 bid. BMET/BNP today, repeat BMET in 10-14 days - Continue BiDil   1 tab tid - Continue Toprol  XL 25 mg daily.  - Off Jardiance  due to severe UTIs and uncontrolled A1C. 2. CKD IV:  Prev baseline ~1.5. Has been >2 since 11/2023 - in the setting of cardio-renal disease and poorly controlled T2DM. A1C 12.6. - Off spiro and ARB - Referral to [facility]; attempted to refer at last appointment.  3. Atrial fibrillation: Paroxysmal. Regular on exam.  - Continue Eliquis  5 mg bid. No abnormal bleeding - Continue amiodarone  200 mg daily. Follow LFTs and TSH. She will need regular eye exam while on amiodarone . 4. PAD: Claudication bilaterally. Not candidate for cilostazol with CHF.  She has had a left fem-pop bypass in 8/20. She underwent PV angiogram with balloon angioplasty of the R SFA 7/22. 8/22 dopplers still showed severe right SFA disease. Dopplers 2/23 showed patent right SFA.  Peripheral arterial dopplers in 2/23 showed 50-74% stenosis right SFA and popliteal.  S/p RLE fem-pop angioplasty and stenting in 9/24.  - Will see if [facility] can help with unna boots. - Continue rosuvastatin .  - She is on apixaban  so no ASA.  5. COPD/OSA:  Smoking 0.5pack/day. Continue bupropion . Attempting to quit.  - Discussed smoking cessation.    - Not able to tolerate CPAP.   6. HTN: BP stable.  - Continue current BP-active meds. 7.Type 2 diabetes: Poorly controlled. A1C 12.6. On insulin .  - Now follows with Endocrine. - Needs opthomologist for eye exam 8. CAD/HLD: LHC in 12/15 with 80% stenosis in small OM1, nonobstructive disease in other territories. No chest pain. - Continue rosuvastatin  + Vascepa  - Not on ASA with need for Eliquis . 9. Carotid stenosis: Stable 4/24 dopplers. 10. Obesity:  Body mass index is 39.57 kg/m. - On Mounjaro  - has not had weight loss yet  Follow up in 4-6 weeks with APP  Harlene Karla Gainer, FNP 04/22/2024

## 2024-04-23 ENCOUNTER — Other Ambulatory Visit (HOSPITAL_COMMUNITY): Payer: Self-pay | Admitting: Emergency Medicine

## 2024-04-23 NOTE — Progress Notes (Unsigned)
 Paramedicine Encounter    Patient ID: [name], female    DOB: 05-03-1961, 63 y.o.   MRN: [id number]   Complaints***  Assessment***  Compliance with meds***  Pill box filled***  Refills needed***  Meds changes since last visit***    Social changes***   LMP  (LMP Unknown)  Weight yesterday-*** Last visit weight-*** CBG 233 Expiratory wheezes  ACTION: {Paramed Action:[phone]}  Ermalinda Hays, EMT-Paramedic [phone] 04/23/24  Patient Care Team: Maryellen Snare, NP as PCP - General Katheryne Pane Frederico Jan, MD as PCP - Cardiology (Cardiology) Darlis Eisenmenger, MD as PCP - Advanced Heart Failure (Cardiology) Lei Pump, MD as PCP - Electrophysiology (Cardiology) Avanell Leigh, MD as Consulting Physician (Cardiology) Diamond Formica, MD as Consulting Physician (Pulmonary Disease) System, Provider Not In  Patient Active Problem List   Diagnosis Date Noted  . Persistent atrial fibrillation ([name]) 03/15/2024  . Benign neoplasm of ascending colon 03/14/2024  . Benign neoplasm of transverse colon 03/14/2024  . Benign neoplasm of descending colon 03/14/2024  . Benign neoplasm of sigmoid colon 03/14/2024  . Chronic systolic congestive heart failure ([name]) 03/12/2024  . Melena 03/12/2024  . Heme positive stool 03/11/2024  . GI bleed 03/08/2024  . History of CAD (coronary artery disease) 03/08/2024  . CKD (chronic kidney disease), stage IV ([name]) 03/08/2024  . Continuous dependence on cigarette smoking 03/08/2024  . GAD (generalized anxiety disorder) 03/08/2024  . HFrEF (heart failure with reduced ejection fraction) ([name]) 10/11/2023  . 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) and diastolic (congestive) heart failure ([name]) 10/10/2023  . Pre-ulcerative calluses 04/25/2022  . PAF (paroxysmal atrial fibrillation) ([name]) 05/05/2021  . COPD (chronic obstructive pulmonary disease) ([name]) 05/05/2021  . OSA (obstructive sleep apnea) 05/05/2021  . Stage 3b chronic  kidney disease ([name]) 05/05/2021  . Pressure injury of skin 05/04/2021  . Diabetic nephropathy ([name]) 01/02/2021  . Low back pain 06/01/2020  . Hyperlipidemia 04/03/2020  . Peripheral artery disease ([name]) 12/26/2019  . NICM (nonischemic cardiomyopathy) ([name]) 06/20/2019  . Non-healing ulcer ([name]) 06/20/2019  . Coagulation disorder ([name]) 08/09/2017  . Depression 07/21/2017  . At risk for adverse drug reaction 06/20/2017  . Peripheral neuropathy 06/20/2017  . S/P transmetatarsal amputation of foot, right ([name]) 06/05/2017  . 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 Obesity, class 2 02/24/2016  . Anticoagulation management encounter 02/10/2016  . Chronic sinus bradycardia 01/12/2016  . Essential hypertension 12/22/2015  . Demand ischemia ([name])   . Acute on chronic diastolic CHF (congestive heart failure) ([name])   . Symptomatic anemia 11/08/2015  . Hypokalemia 11/08/2015  . Tobacco abuse 10/23/2015  . Coronary artery disease   . DOE (dyspnea on exertion) 04/29/2015  . Paroxysmal atrial fibrillation ([name]) 01/16/2015  . Carotid artery stenosis 01/16/2015  . Insomnia 02/03/2014  . S/P peripheral artery angioplasty - TurboHawk atherectomy; R SFA 09/11/2013    Class: Acute  . Leg pain, bilateral 08/19/2013  . Hypothyroidism 07/31/2013  . History of cocaine abuse ([name]) 06/13/2013  . Long term current use of anticoagulant therapy 05/20/2013  . Alcohol abuse   . Narcotic abuse ([name])   . Marijuana abuse   . Alcoholic cirrhosis ([name])   . Insulin  dependent type 2 diabetes mellitus ([name])     Current Outpatient Medications:  .  acetaminophen  (TYLENOL ) 500 MG tablet, Take 1,000 mg by mouth every 6 (six) hours as needed for headache (pain)., Disp: , Rfl:  .  albuterol  (VENTOLIN  HFA)  108 (90 Base) MCG/ACT inhaler, USE 2 PUFFS BY MOUTH EVERY FOUR HOURS, AS NEEDED, FOR COUGHING/WHEEZING, Disp: 8.5 g, Rfl: 0 .  allopurinol   (ZYLOPRIM ) 100 MG tablet, Take 150 mg by mouth daily., Disp: , Rfl:  .  amiodarone  (PACERONE ) 200 MG tablet, Take 1 tablet (200 mg total) by mouth daily., Disp: 30 tablet, Rfl: 1 .  apixaban  (ELIQUIS ) 5 MG TABS tablet, Take 1 tablet (5 mg total) by mouth 2 (two) times daily., Disp: 60 tablet, Rfl: 6 .  budesonide -formoterol  (SYMBICORT ) 160-4.5 MCG/ACT inhaler, Inhale 2 puffs into the lungs 2 (two) times daily., Disp: 1 each, Rfl: 2 .  buPROPion  (WELLBUTRIN  XL) 300 MG 24 hr tablet, Take 300 mg by mouth every morning., Disp: , Rfl:  .  Cholecalciferol  (VITAMIN D3) 50 MCG (2000 UT) capsule, Take 1 capsule by mouth every morning., Disp: , Rfl:  .  colchicine  0.6 MG tablet, Take 1.2 mg by mouth See admin instructions. Take 1.2 mg for flair up and an hour later take 0.6 mg if needed, Disp: , Rfl:  .  docusate sodium  (COLACE) 100 MG capsule, Take 2 capsules (200 mg total) by mouth at bedtime., Disp: 10 capsule, Rfl: 0 .  erythromycin ophthalmic ointment, Place 1 Application into the right eye 2 (two) times daily., Disp: , Rfl:  .  fluticasone  (FLONASE) 50 MCG/ACT nasal spray, Place 1 spray into both nostrils as needed for allergies or rhinitis., Disp: , Rfl:  .  folic acid  (FOLVITE ) 1 MG tablet, Take 1 tablet (1 mg total) by mouth daily., Disp: 30 tablet, Rfl: 0 .  gabapentin  (NEURONTIN ) 100 MG capsule, Take 1 capsule (100 mg total) by mouth 3 (three) times daily., Disp: 270 capsule, Rfl: 1 .  insulin  lispro (HUMALOG ) 100 UNIT/ML KwikPen, Inject 15 Units into the skin 3 (three) times daily with meals. Plus sliding scale, max dose 70 units /day, Disp: 25 mL, Rfl: 1 .  isosorbide -hydrALAZINE  (BIDIL ) 20-37.5 MG tablet, Take 1 tablet by mouth 2 (two) times daily., Disp: 180 tablet, Rfl: 3 .  LANTUS  SOLOSTAR 100 UNIT/ML Solostar Pen, Inject 30 Units into the skin daily., Disp: 15 mL, Rfl: 0 .  levothyroxine  (SYNTHROID ) 50 MCG tablet, TAKE 1 TABLET (50 MCG TOTAL) BY MOUTH DAILY BEFORE BREAKFAST (AM), Disp: 30  tablet, Rfl: 0 .  Menthol -Camphor (ICY HOT PRO NO MESS EX), Apply 1 Application topically 3 (three) times daily as needed (arms, neuropathy lower extrimity, gout in hand.)., Disp: , Rfl:  .  metoprolol  succinate (TOPROL -XL) 25 MG 24 hr tablet, Take 1 tablet (25 mg total) by mouth daily., Disp: 90 tablet, Rfl: 3 .  NYAMYC  powder, Apply 1 Application topically 2 (two) times daily., Disp: , Rfl:  .  nystatin  cream (MYCOSTATIN ), Apply 1 Application topically 2 (two) times daily as needed for dry skin., Disp: , Rfl:  .  Omega-3 Fatty Acids (FISH OIL PO), Take 1 tablet by mouth daily., Disp: , Rfl:  .  pantoprazole  (PROTONIX ) 40 MG tablet, TAKE 1 TABLET (40 MG TOTAL) BY MOUTH DAILY(AM), Disp: 30 tablet, Rfl: 0 .  potassium chloride  SA (KLOR-CON  M) 20 MEQ tablet, Take 2 tablets (40 mEq total) by mouth 2 (two) times daily. Take 40meq in am 20 meq in pm, Disp: 180 tablet, Rfl: 3 .  rosuvastatin  (CRESTOR ) 40 MG tablet, Take 1 tablet (40 mg total) by mouth daily., Disp: 90 tablet, Rfl: 3 .  polyethylene glycol (MIRALAX  / GLYCOLAX ) 17 g packet, Take 17 g by mouth daily as needed  for moderate constipation. (Patient not taking: Reported on 04/23/2024), Disp: 14 each, Rfl: 0 .  tirzepatide  (MOUNJARO ) 10 MG/0.5ML Pen, Inject 10 mg into the skin once a week., Disp: 2 mL, Rfl: 0 .  torsemide  (DEMADEX ) 20 MG tablet, Take 5 tablets (100 mg total) by mouth 2 (two) times daily., Disp: 300 tablet, Rfl: 6 .  triamcinolone  ointment (KENALOG ) 0.1 %, Apply topically 2 (two) times daily., Disp: 454 g, Rfl: 0 .  VITAMIN E PO, Take 1 tablet by mouth daily., Disp: , Rfl:  Allergies  Allergen Reactions  . Gabapentin  Nausea And Vomiting and Other (See Comments)    POSSIBLE SHAKING  . Lyrica  [Pregabalin ] Other (See Comments)    Shaking      Social History   Socioeconomic History  . Marital status: Single    Spouse name: Not on file  . Number of children: 1  . Years of education: 56  . Highest education level: 12th grade   Occupational History  . Occupation: disabled  Tobacco Use  . Smoking status: Every Day    Current packs/day: 1.00    Average packs/day: 1 pack/day for 44.0 years (44.0 ttl pk-yrs)    Types: E-cigarettes, Cigarettes  . Smokeless tobacco: Former    Types: Snuff  Vaping Use  . Vaping status: Former  . Devices: 11/26/2018 "stopped months ago"  Substance and Sexual Activity  . Alcohol use: Not Currently    Comment: occ  . Drug use: Not Currently    Types: "Crack" cocaine, Marijuana, Oxycodone   . Sexual activity: Not Currently  Other Topics Concern  . Not on file  Social History Narrative   ** Merged History Encounter **       Lives in [place], in motel with sister.  They are looking to move but don't have a place to go yet.     Social Drivers of Health   Financial Resource Strain: Low Risk  (02/09/2023)   Overall Financial Resource Strain (CARDIA)   . Difficulty of Paying Living Expenses: Not hard at all  Food Insecurity: No Food Insecurity (03/09/2024)   Hunger Vital Sign   . Worried About Programme researcher, broadcasting/film/video in the Last Year: Never true   . Ran Out of Food in the Last Year: Never true  Transportation Needs: Unmet Transportation Needs (03/09/2024)   PRAPARE - Transportation   . Lack of Transportation (Medical): Yes   . Lack of Transportation (Non-Medical): Yes  Physical Activity: Inactive (02/09/2023)   Exercise Vital Sign   . Days of Exercise per Week: 0 days   . Minutes of Exercise per Session: 0 min  Stress: No Stress Concern Present (02/09/2023)   [employer] of Occupational Health - Occupational Stress Questionnaire   . Feeling of Stress : Not at all  Social Connections: Unknown (08/21/2023)   Received from [facility]   Social Network   . Social Network: Not on file  Intimate Partner Violence: Not At Risk (03/09/2024)   Humiliation, Afraid, Rape, and Kick questionnaire   . Fear of Current or Ex-Partner: No   . Emotionally Abused: No   . Physically Abused: No   .  Sexually Abused: No    Physical Exam      Future Appointments  Date Time Provider Department Center  04/29/2024  2:20 PM Thomasena Fleming, NP [facility] H&V  05/06/2024  2:15 PM [facility] None  05/07/2024  3:15 PM Iver Marker, RD [facility] NDM  05/21/2024  2:30 PM [facility] PA/NP  [facility] None  05/22/2024  2:20 PM Motwani, Komal, MD [facility] None  06/17/2024  1:20 PM [facility] 2 GI-315[facility]

## 2024-04-24 ENCOUNTER — Encounter (HOSPITAL_COMMUNITY): Payer: Self-pay

## 2024-04-24 ENCOUNTER — Other Ambulatory Visit: Payer: Self-pay

## 2024-04-24 ENCOUNTER — Emergency Department (HOSPITAL_COMMUNITY)
Admission: EM | Admit: 2024-04-24 | Discharge: 2024-04-24 | Disposition: A | Discharge status: Home / Self Care | Attending: Emergency Medicine | Admitting: Emergency Medicine

## 2024-04-24 ENCOUNTER — Emergency Department (HOSPITAL_COMMUNITY)

## 2024-04-24 ENCOUNTER — Ambulatory Visit (HOSPITAL_COMMUNITY): Payer: Self-pay | Admitting: Family Medicine

## 2024-04-24 DIAGNOSIS — D649 Anemia, unspecified: Secondary | ICD-10-CM | POA: Insufficient documentation

## 2024-04-24 DIAGNOSIS — I2510 Atherosclerotic heart disease of native coronary artery without angina pectoris: Secondary | ICD-10-CM | POA: Insufficient documentation

## 2024-04-24 DIAGNOSIS — E119 Type 2 diabetes mellitus without complications: Secondary | ICD-10-CM | POA: Diagnosis not present

## 2024-04-24 DIAGNOSIS — S40811A Abrasion of right upper arm, initial encounter: Secondary | ICD-10-CM | POA: Insufficient documentation

## 2024-04-24 DIAGNOSIS — X58XXXA Exposure to other specified factors, initial encounter: Secondary | ICD-10-CM | POA: Insufficient documentation

## 2024-04-24 DIAGNOSIS — Z794 Long term (current) use of insulin: Secondary | ICD-10-CM | POA: Diagnosis not present

## 2024-04-24 DIAGNOSIS — R600 Localized edema: Secondary | ICD-10-CM | POA: Insufficient documentation

## 2024-04-24 DIAGNOSIS — Z7901 Long term (current) use of anticoagulants: Secondary | ICD-10-CM | POA: Insufficient documentation

## 2024-04-24 DIAGNOSIS — M7989 Other specified soft tissue disorders: Secondary | ICD-10-CM

## 2024-04-24 DIAGNOSIS — M542 Cervicalgia: Secondary | ICD-10-CM | POA: Insufficient documentation

## 2024-04-24 DIAGNOSIS — I509 Heart failure, unspecified: Secondary | ICD-10-CM | POA: Insufficient documentation

## 2024-04-24 LAB — CBC WITH DIFFERENTIAL/PLATELET
Abs Immature Granulocytes: 0.03 10*3/uL (ref 0.00–0.07)
Basophils Absolute: 0 10*3/uL (ref 0.0–0.1)
Basophils Relative: 0 %
Eosinophils Absolute: 0.3 10*3/uL (ref 0.0–0.5)
Eosinophils Relative: 4 %
HCT: 27.4 % — ABNORMAL LOW (ref 36.0–46.0)
Hemoglobin: 8 g/dL — ABNORMAL LOW (ref 12.0–15.0)
Immature Granulocytes: 0 %
Lymphocytes Relative: 14 %
Lymphs Abs: 1.1 10*3/uL (ref 0.7–4.0)
MCH: 28.8 pg (ref 26.0–34.0)
MCHC: 29.2 g/dL — ABNORMAL LOW (ref 30.0–36.0)
MCV: 98.6 fL (ref 80.0–100.0)
Monocytes Absolute: 0.5 10*3/uL (ref 0.1–1.0)
Monocytes Relative: 7 %
Neutro Abs: 5.9 10*3/uL (ref 1.7–7.7)
Neutrophils Relative %: 75 %
Platelets: 268 10*3/uL (ref 150–400)
RBC: 2.78 MIL/uL — ABNORMAL LOW (ref 3.87–5.11)
RDW: 17.2 % — ABNORMAL HIGH (ref 11.5–15.5)
WBC: 7.9 10*3/uL (ref 4.0–10.5)
nRBC: 0 % (ref 0.0–0.2)

## 2024-04-24 LAB — COMPREHENSIVE METABOLIC PANEL WITH GFR
ALT: 16 U/L (ref 0–44)
AST: 14 U/L — ABNORMAL LOW (ref 15–41)
Albumin: 2.6 g/dL — ABNORMAL LOW (ref 3.5–5.0)
Alkaline Phosphatase: 96 U/L (ref 38–126)
Anion gap: 15 (ref 5–15)
BUN: 56 mg/dL — ABNORMAL HIGH (ref 8–23)
CO2: 25 mmol/L (ref 22–32)
Calcium: 8.5 mg/dL — ABNORMAL LOW (ref 8.9–10.3)
Chloride: 102 mmol/L (ref 98–111)
Creatinine, Ser: 2.12 mg/dL — ABNORMAL HIGH (ref 0.44–1.00)
GFR, Estimated: 26 mL/min — ABNORMAL LOW (ref >60)
Glucose, Bld: 217 mg/dL — ABNORMAL HIGH (ref 70–99)
Potassium: 4.4 mmol/L (ref 3.5–5.1)
Sodium: 142 mmol/L (ref 135–145)
Total Bilirubin: 0.5 mg/dL (ref 0.0–1.2)
Total Protein: 6.2 g/dL — ABNORMAL LOW (ref 6.5–8.1)

## 2024-04-24 LAB — CBG MONITORING, ED: Glucose-Capillary: 171 mg/dL — ABNORMAL HIGH (ref 70–99)

## 2024-04-24 LAB — TROPONIN I (HIGH SENSITIVITY)
Troponin I (High Sensitivity): 20 ng/L — ABNORMAL HIGH (ref <18)
Troponin I (High Sensitivity): 20 ng/L — ABNORMAL HIGH (ref <18)

## 2024-04-24 LAB — BRAIN NATRIURETIC PEPTIDE: B Natriuretic Peptide: 763.8 pg/mL — ABNORMAL HIGH (ref 0.0–100.0)

## 2024-04-24 MED ORDER — FUROSEMIDE 10 MG/ML IJ SOLN
80.0000 mg | Freq: Once | INTRAMUSCULAR | Status: AC
  Administered 2024-04-24: 80 mg via INTRAVENOUS
  Filled 2024-04-24: qty 8

## 2024-04-24 NOTE — ED Notes (Signed)
 [facility] called.

## 2024-04-24 NOTE — ED Notes (Signed)
 Lab contacted regarding troponin lab sent down at 1600. States it was as saved as a save tub and they will add troponin on now.

## 2024-04-24 NOTE — ED Notes (Signed)
Bedside commode set up at bedside for pt.

## 2024-04-24 NOTE — ED Provider Notes (Signed)
 [facility] Provider Note   CSN: [id number] Arrival date & time: 04/24/24  1106     History  Chief Complaint  Patient presents with   Arm Swelling   Leg Swelling    [name] is a 63 y.o. female.  HPI 25 old female with a history of CHF, diabetes, CAD, and other comorbidities presents with swelling.  She has had progressive leg swelling bilaterally for over a week.  Due to this, the paramedic that evaluated her yesterday recommended going from 80 mg of torsemide  up to 100 mg.  She denies any shortness of breath or chest pain.  However she has been having some right-sided neck pain over the past week or so.  She bumped her right upper arm causing an abrasion and family wrapped it tightly to stop bleeding.  This wrap was left on all night and when the patient woke up and in the middle the night her right forearm and hand were swollen.  She thinks the wrap was on too tight.  The swelling remains despite the wrap being removed.  No injuries to her forearm or hand.  No numbness.  Home Medications Prior to Admission medications   Medication Sig Start Date End Date Taking? Authorizing Provider  acetaminophen  (TYLENOL ) 500 MG tablet Take 1,000 mg by mouth every 6 (six) hours as needed for headache (pain).    [provider]  albuterol  (VENTOLIN  HFA) 108 (90 Base) MCG/ACT inhaler USE 2 PUFFS BY MOUTH EVERY FOUR HOURS, AS NEEDED, FOR COUGHING/WHEEZING 07/12/23   Newlin, [name], MD  allopurinol  (ZYLOPRIM ) 100 MG tablet Take 150 mg by mouth daily.    [provider]  amiodarone  (PACERONE ) 200 MG tablet Take 1 tablet (200 mg total) by mouth daily. 03/18/24   Regalado, Belkys A, MD  apixaban  (ELIQUIS ) 5 MG TABS tablet Take 1 tablet (5 mg total) by mouth 2 (two) times daily. 07/05/23   Clegg, [name] D, NP  budesonide -formoterol  (SYMBICORT ) 160-4.5 MCG/ACT inhaler Inhale 2 puffs into the lungs 2 (two) times daily. 07/12/23   Newlin, [name], MD   buPROPion  (WELLBUTRIN  XL) 300 MG 24 hr tablet Take 300 mg by mouth every morning. 02/14/24   [provider]  Cholecalciferol  (VITAMIN D3) 50 MCG (2000 UT) capsule Take 1 capsule by mouth every morning.    [provider]  colchicine  0.6 MG tablet Take 1.2 mg by mouth See admin instructions. Take 1.2 mg for flair up and an hour later take 0.6 mg if needed    [provider]  docusate sodium  (COLACE) 100 MG capsule Take 2 capsules (200 mg total) by mouth at bedtime. 03/17/24   Regalado, Belkys A, MD  erythromycin ophthalmic ointment Place 1 Application into the right eye 2 (two) times daily. 02/27/24   [provider]  fluticasone  (FLONASE) 50 MCG/ACT nasal spray Place 1 spray into both nostrils as needed for allergies or rhinitis.    [provider]  folic acid  (FOLVITE ) 1 MG tablet Take 1 tablet (1 mg total) by mouth daily. 03/17/24   Regalado, Belkys A, MD  gabapentin  (NEURONTIN ) 100 MG capsule Take 1 capsule (100 mg total) by mouth 3 (three) times daily. 02/05/24 05/05/24  Motwani, Komal, MD  insulin  lispro (HUMALOG ) 100 UNIT/ML KwikPen Inject 15 Units into the skin 3 (three) times daily with meals. Plus sliding scale, max dose 70 units /day 03/17/24   Regalado, Belkys A, MD  isosorbide -hydrALAZINE  (BIDIL ) 20-37.5 MG tablet Take 1  tablet by mouth 2 (two) times daily. 04/16/24   Milford, Arlice Bene, FNP  LANTUS  SOLOSTAR 100 UNIT/ML Solostar Pen Inject 30 Units into the skin daily. 03/17/24   Regalado, Belkys A, MD  levothyroxine  (SYNTHROID ) 50 MCG tablet TAKE 1 TABLET (50 MCG TOTAL) BY MOUTH DAILY BEFORE BREAKFAST (AM) 05/02/23   Newlin, [name], MD  Menthol -Camphor (ICY HOT PRO NO MESS EX) Apply 1 Application topically 3 (three) times daily as needed (arms, neuropathy lower extrimity, gout in hand.).    [provider]  metoprolol  succinate (TOPROL -XL) 25 MG 24 hr tablet Take 1 tablet (25 mg total) by mouth daily. 10/19/23   Ruddy Corral M, PA-C  NYAMYC   powder Apply 1 Application topically 2 (two) times daily. 02/09/24   [provider]  nystatin  cream (MYCOSTATIN ) Apply 1 Application topically 2 (two) times daily as needed for dry skin. 02/09/24   [provider]  Omega-3 Fatty Acids (FISH OIL PO) Take 1 tablet by mouth daily.    [provider]  pantoprazole  (PROTONIX ) 40 MG tablet TAKE 1 TABLET (40 MG TOTAL) BY MOUTH DAILY(AM) 05/02/23   Newlin, [name], MD  polyethylene glycol (MIRALAX  / GLYCOLAX ) 17 g packet Take 17 g by mouth daily as needed for moderate constipation. Patient not taking: Reported on 04/23/2024 08/05/23   Akula, Vijaya, MD  potassium chloride  SA (KLOR-CON  M) 20 MEQ tablet Take 2 tablets (40 mEq total) by mouth 2 (two) times daily. Take 40meq in am 20 meq in pm 04/22/24   Elmarie Hacking, FNP  rosuvastatin  (CRESTOR ) 40 MG tablet Take 1 tablet (40 mg total) by mouth daily. 04/04/24   Darlis Eisenmenger, MD  tirzepatide  (MOUNJARO ) 10 MG/0.5ML Pen Inject 10 mg into the skin once a week. 04/10/24   Motwani, Komal, MD  torsemide  (DEMADEX ) 20 MG tablet Take 5 tablets (100 mg total) by mouth 2 (two) times daily. 04/22/24 07/21/24  Elmarie Hacking, FNP  triamcinolone  ointment (KENALOG ) 0.1 % Apply topically 2 (two) times daily. 08/09/23   Newlin, [name], MD  VITAMIN E PO Take 1 tablet by mouth daily.    [provider]  tiotropium (SPIRIVA  HANDIHALER) 18 MCG inhalation capsule Place 1 capsule (18 mcg total) into inhaler and inhale every morning. 12/09/19 02/09/21  Armenta Landau, MD      Allergies    Neurontin  [gabapentin ], Lyrica  Carol.Carol ], and Other    Review of Systems   Review of Systems  Respiratory:  Negative for shortness of breath.   Cardiovascular:  Positive for leg swelling. Negative for chest pain.    Physical Exam Updated Vital Signs BP (!) 133/59   Pulse (!) 52   Temp 97.9 F (36.6 C) (Tympanic)   Resp (!) 21   Ht 5' 3 (1.6 m)   Wt 102.1 kg   LMP  (LMP Unknown)   SpO2 100%    BMI 39.86 kg/m  Physical Exam Vitals and nursing note reviewed.  Constitutional:      Appearance: She is well-developed.  HENT:     Head: Normocephalic and atraumatic.  Cardiovascular:     Rate and Rhythm: Normal rate and regular rhythm.     Pulses:          Radial pulses are 2+ on the right side.     Heart sounds: Normal heart sounds.  Pulmonary:     Effort: Pulmonary effort is normal.     Breath sounds: Normal breath sounds.  Musculoskeletal:     Right lower leg: Edema  present.     Left lower leg: Edema present.     Comments: Right forearm does appear mildly swollen compared to the left.  This includes the right hand.  Normal range of motion of the elbow and wrist.  No focal tenderness.  Proximal to this in the upper arm there is a small abrasion covered with a Band-Aid.  When the bandage was removed there is no evidence of bleeding.  Skin:    General: Skin is warm and dry.  Neurological:     Mental Status: She is alert.     ED Results / Procedures / Treatments   Labs (all labs ordered are listed, but only abnormal results are displayed) Labs Reviewed  COMPREHENSIVE METABOLIC PANEL WITH GFR - Abnormal; Notable for the following components:      Result Value   Glucose, Bld 217 (*)    BUN 56 (*)    Creatinine, Ser 2.12 (*)    Calcium  8.5 (*)    Total Protein 6.2 (*)    Albumin  2.6 (*)    AST 14 (*)    GFR, Estimated 26 (*)    All other components within normal limits  BRAIN NATRIURETIC PEPTIDE - Abnormal; Notable for the following components:   B Natriuretic Peptide 763.8 (*)    All other components within normal limits  CBC WITH DIFFERENTIAL/PLATELET - Abnormal; Notable for the following components:   RBC 2.78 (*)    Hemoglobin 8.0 (*)    HCT 27.4 (*)    MCHC 29.2 (*)    RDW 17.2 (*)    All other components within normal limits  TROPONIN I (HIGH SENSITIVITY) - Abnormal; Notable for the following components:   Troponin I (High Sensitivity) 20 (*)    All other  components within normal limits  TROPONIN I (HIGH SENSITIVITY)    EKG EKG Interpretation Date/Time:  Wednesday April 24 2024 13:26:38 EDT Ventricular Rate:  69 PR Interval:    QRS Duration:  152 QT Interval:  417 QTC Calculation: 406 R Axis:   33  Text Interpretation: likely sinus rhythm Ventricular premature complex Nonspecific intraventricular conduction delay Probable lateral infarct, age indeterminate Artifact in lead(s) I II aVR aVL aVF Interpretation limited secondary to artifact Confirmed by Jerilynn Montenegro [phone]) on 04/24/2024 2:07:58 PM  Radiology DG Chest 2 View Result Date: 04/24/2024 CLINICAL DATA:  Bilateral lower extremity swelling. Congestive heart failure. EXAM: CHEST - 2 VIEW COMPARISON:  March 08, 2024. FINDINGS: Mild cardiomegaly. Both lungs are clear. The visualized skeletal structures are unremarkable. IMPRESSION: No active cardiopulmonary disease. Electronically Signed   By: Rosalene Colon M.D.   On: 04/24/2024 13:46    Procedures Procedures    Medications Ordered in ED Medications  furosemide  (LASIX ) injection 80 mg (has no administration in time range)    ED Course/ Medical Decision Making/ A&P                                 Medical Decision Making Amount and/or Complexity of Data Reviewed Labs: ordered.    Details: CKD stable.  BNP of 763 is a little higher than baseline.  Troponin of 20.  Stable anemia Radiology: ordered and independent interpretation performed.    Details: No pulmonary edema ECG/medicine tests: ordered and independent interpretation performed.    Details: Artifact, no clear ischemia  Risk Prescription drug management.   Patient presents with edema.  Found to have a little bit  of a higher BNP than typical so will be given a dose of Lasix .  Has had some nonspecific neck pain, so troponins were sent, first one is 20, will need repeat. For her right forearm, there has been no trauma, there is no bony tenderness or obvious joint  effusion.  I suspect this was related to the wrap that she had on her upper arm all night causing some distal swelling.  I have encouraged her to elevate her extremity. I highly doubt DVT.  Care transferred to Dr. Nolia Baumgartner with the second troponin pending.         Final Clinical Impression(s) / ED Diagnoses Final diagnoses:  None    Rx / DC Orders ED Discharge Orders     None         Jerilynn Montenegro, MD 04/24/24 1546

## 2024-04-24 NOTE — ED Triage Notes (Addendum)
 Pt c/o bilateral leg swelling and left arm swelling. Hx of CHF. Pt takes 100mg  of torsemide , pt took it today. No urinary issues. Denies SHOB. Pt states 5lb weight gain

## 2024-04-24 NOTE — ED Notes (Signed)
 AVS provided by edp was reviewed with pt. Pt verbalized understanding with no additional questions at this time. Pt called family for ride home.

## 2024-04-24 NOTE — ED Provider Notes (Signed)
 Patient care was taken over from Dr. Adrien Horner.  Patient presented with some increased right arm swelling although this seems to be related to a tight bandage that was placed on the arm.  Overall her CHF has been a little bit worse.  She had a paramedic visit yesterday and her torsemide  was increased.  Her creatinine is mildly increased above her baseline but it is better than it was 2 days ago.  Her troponins are flat.  Her EKG does show what appears to be A-fib.  Discussed with Dr. Julane Ny who reviewed her labs and EKG and felt that patient can be discharged home with close follow-up with their office which she will arrange.   Hershel Los, MD 04/24/24 1904

## 2024-04-24 NOTE — Discharge Instructions (Addendum)
 Continue taking your increased dose of torsemide , 100 mg.  The CHF clinic should arrange close follow-up.  If you do not hear from them, call the office.  Return to the emergency room if you have any worsening symptoms.

## 2024-04-28 NOTE — Progress Notes (Unsigned)
 Electrophysiology Office Note:   Date:  04/29/2024  ID:  [name], DOB January 03, 1961, MRN [id number]  Primary Cardiologist: Lauro Portal, MD Primary Heart Failure: Peder Bourdon, MD Electrophysiologist: Lei Pump, MD      History of Present Illness:   [name] is a 63 y.o. female with h/o AF, HFrEF, CAD, HLD, carotid stenosis s/p CEA, PAD, tobacco abuse, COPD, gout, GIB seen today for post hospital follow up.    Admitted 03/08/24 - 03/17/24 with chest pain and melena.  Pt had been using BC powder, Hgb was 5.3 on admit.  Endoscopy was negative for source of bleeding.  Colonoscopy showed multiple polyps.   Since discharge from hospital the patient reports doing very well. She is unaware of any further bleeding or AF episodes. She is back on her Eliquis . Most recent Hgb 8.     She denies chest pain, palpitations, dyspnea, PND, orthopnea, nausea, vomiting, dizziness, syncope, edema, weight gain, or early satiety.   Review of systems complete and found to be negative unless listed in HPI.   EP Information / Studies Reviewed:    EKG is ordered today. Personal review as below.  EKG Interpretation Date/Time:  Monday April 29 2024 14:36:14 EDT Ventricular Rate:  67 PR Interval:  166 QRS Duration:  96 QT Interval:  408 QTC Calculation: 431 R Axis:   1  Text Interpretation: Normal sinus rhythm with sinus arrhythmia Confirmed by Creighton Doffing ([id number]) on 04/29/2024 2:47:43 PM   Studies:  ECHO 03/11/24 > LVEF 35-40%, LV global hypokinesis, G1DD, RV systolic function mildly reduced, mod elevated PASP at 55.9, MV degenerative with mod MV regurgitation  Arrhythmia / AAD AF    Risk Assessment/Calculations:    CHA2DS2-VASc Score = 5   This indicates a 7.2% annual risk of stroke. The patient's score is based upon: CHF History: 1 HTN History: 1 Diabetes History: 1 Stroke History: 0 Vascular Disease History: 1 Age Score: 0 Gender Score: 1             Physical Exam:    VS:  BP 134/76   Pulse 76   Ht 5' 3 (1.6 m)   Wt 211 lb 9.6 oz (96 kg)   LMP  (LMP Unknown)   SpO2 95%   BMI 37.48 kg/m    Wt Readings from Last 3 Encounters:  04/29/24 211 lb 9.6 oz (96 kg)  04/24/24 225 lb (102.1 kg)  04/23/24 235 lb (106.6 kg)     GEN: Well nourished, well developed in no acute distress NECK: No JVD; No carotid bruits CARDIAC: Regular rate and rhythm, no murmurs, rubs, gallops RESPIRATORY:  Clear to auscultation without rales, wheezing or rhonchi  ABDOMEN: Soft, non-tender, non-distended EXTREMITIES:  No edema; No deformity   ASSESSMENT AND PLAN:    Paroxysmal Atrial Fibrillation  High Risk Medication Monitoring: Amiodarone   CHA2DS2-VASc 5, hx DCCV -OAC for stroke prophylaxis  -EKG with NSR, stable intervals -reduce amiodarone  to 100 mg daily  -update amio labs > TSH/free T4.  Recent LFT's wnl.   -Toprol  25 mg daily  -most likely driven by GIB during last admit -f/u in 3 months to evaluate on 100 mg daily of amiodarone    Secondary Hypercoagulable State  -continue Eliquis  5mg  BID, dose reviewed and appropriate by age / wt   HFmrEF  -follows with AHF Team  -cardiorenal syndrome limits GDMT  CAD  PVD -per Cardiology   Follow up with Dr. Lawana Pray or EP APP in 3 months  Signed,  Creighton Doffing, NP-C, AGACNP-BC [facility] - Electrophysiology  04/29/2024, 4:46 PM

## 2024-04-29 ENCOUNTER — Encounter: Payer: Self-pay | Admitting: Pulmonary Disease

## 2024-04-29 ENCOUNTER — Ambulatory Visit: Attending: Pulmonary Disease | Admitting: Pulmonary Disease

## 2024-04-29 VITALS — BP 134/76 | HR 76 | Ht 63.0 in | Wt 211.6 lb

## 2024-04-29 DIAGNOSIS — I480 Paroxysmal atrial fibrillation: Secondary | ICD-10-CM | POA: Diagnosis not present

## 2024-04-29 DIAGNOSIS — Z79899 Other long term (current) drug therapy: Secondary | ICD-10-CM

## 2024-04-29 DIAGNOSIS — I739 Peripheral vascular disease, unspecified: Secondary | ICD-10-CM

## 2024-04-29 DIAGNOSIS — D6869 Other thrombophilia: Secondary | ICD-10-CM

## 2024-04-29 DIAGNOSIS — I5022 Chronic systolic (congestive) heart failure: Secondary | ICD-10-CM

## 2024-04-29 DIAGNOSIS — I2510 Atherosclerotic heart disease of native coronary artery without angina pectoris: Secondary | ICD-10-CM | POA: Diagnosis not present

## 2024-04-29 MED ORDER — AMIODARONE HCL 200 MG PO TABS: 100.0000 mg | ORAL_TABLET | Freq: Every day | ORAL | 1 refills | Status: DC

## 2024-04-29 NOTE — Patient Instructions (Addendum)
 Medication Instructions:  Reduce your Amiodarone  to 100 mg daily (half a tablet daily)   *If you need a refill on your cardiac medications before your next appointment, please call your pharmacy*  Lab Work: We will check your thyroid  numbers today as these need to be monitored while on amiodarone .  Your recent liver enzymes were within normal limits.  If you have labs (blood work) drawn today and your tests are completely normal, you will receive your results only by: MyChart Message (if you have MyChart) OR A paper copy in the mail If you have any lab test that is abnormal or we need to change your treatment, we will call you to review the results.  Testing/Procedures: No testing/procedures were scheduled today  Follow-Up: At [facility], you and your health needs are our priority.  As part of our continuing mission to provide you with exceptional heart care, our providers are all part of one team.  This team includes your primary Cardiologist (physician) and Advanced Practice Providers or APPs (Physician Assistants and Nurse Practitioners) who all work together to provide you with the care you need, when you need it.  Your next appointment:   3 month(s)  Provider:   You may see Will Cortland Ding, MD or one of the following Advanced Practice Providers on your designated Care Team:    Creighton Doffing, NP    We recommend signing up for the patient portal called MyChart.  Sign up information is provided on this After Visit Summary.  MyChart is used to connect with patients for Virtual Visits (Telemedicine).  Patients are able to view lab/test results, encounter notes, upcoming appointments, etc.  Non-urgent messages can be sent to your provider as well.   To learn more about what you can do with MyChart, go to [email or web address].

## 2024-04-30 ENCOUNTER — Other Ambulatory Visit (HOSPITAL_COMMUNITY): Payer: Self-pay

## 2024-04-30 ENCOUNTER — Ambulatory Visit: Payer: Self-pay | Admitting: Pulmonary Disease

## 2024-04-30 ENCOUNTER — Telehealth (HOSPITAL_COMMUNITY): Payer: Self-pay | Admitting: Adult Health

## 2024-04-30 LAB — T4, FREE: Free T4: 1.33 ng/dL (ref 0.82–1.77)

## 2024-04-30 LAB — TSH: TSH: 4.43 u[IU]/mL (ref 0.450–4.500)

## 2024-04-30 NOTE — Progress Notes (Signed)
 Paramedicine Encounter    Patient ID: [name], female    DOB: Oct 20, 1961, 63 y.o.   MRN: [id number]   Came back out for med rec after p/u meds from pharmacy and placed potassium and bidil  where it was needed.      Patient Care Team: Maryellen Snare, NP as PCP - General Katheryne Pane Frederico Jan, MD as PCP - Cardiology (Cardiology) Darlis Eisenmenger, MD as PCP - Advanced Heart Failure (Cardiology) Lei Pump, MD as PCP - Electrophysiology (Cardiology) Avanell Leigh, MD as Consulting Physician (Cardiology) Diamond Formica, MD as Consulting Physician (Pulmonary Disease) System, Provider Not In  Patient Active Problem List   Diagnosis Date Noted   Persistent atrial fibrillation ([name]) 03/15/2024   Benign neoplasm of ascending colon 03/14/2024   Benign neoplasm of transverse colon 03/14/2024   Benign neoplasm of descending colon 03/14/2024   Benign neoplasm of sigmoid colon 03/14/2024   Chronic systolic congestive heart failure ([name]) 03/12/2024   Melena 03/12/2024   Heme positive stool 03/11/2024   GI bleed 03/08/2024   History of CAD (coronary artery disease) 03/08/2024   CKD (chronic kidney disease), stage IV ([name]) 03/08/2024   Continuous dependence on cigarette smoking 03/08/2024   GAD (generalized anxiety disorder) 03/08/2024   HFrEF (heart failure with reduced ejection fraction) ([name]) 10/11/2023   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) and diastolic (congestive) heart failure ([name]) 10/10/2023   Pre-ulcerative calluses 04/25/2022   PAF (paroxysmal atrial fibrillation) ([name]) 05/05/2021   COPD (chronic obstructive pulmonary disease) ([name]) 05/05/2021   OSA (obstructive sleep apnea) 05/05/2021   Stage 3b chronic kidney disease ([name]) 05/05/2021   Pressure injury of skin 05/04/2021   Diabetic nephropathy ([name]) 01/02/2021   Low back pain 06/01/2020   Hyperlipidemia 04/03/2020   Peripheral artery disease ([name]) 12/26/2019   NICM (nonischemic cardiomyopathy) ([name])  06/20/2019   Non-healing ulcer ([name]) 06/20/2019   Coagulation disorder ([name]) 08/09/2017   Depression 07/21/2017   At risk for adverse drug reaction 06/20/2017   Peripheral neuropathy 06/20/2017   S/P transmetatarsal amputation of foot, right ([name]) 06/05/2017   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) 05/19/2017   Obesity, class 2 02/24/2016   Anticoagulation management encounter 02/10/2016   Chronic sinus bradycardia 01/12/2016   Essential hypertension 12/22/2015   Demand ischemia ([name])    Acute on chronic diastolic CHF (congestive heart failure) ([name])    Symptomatic anemia 11/08/2015   Hypokalemia 11/08/2015   Tobacco abuse 10/23/2015   Coronary artery disease    DOE (dyspnea on exertion) 04/29/2015   Paroxysmal atrial fibrillation ([name]) 01/16/2015   Carotid artery stenosis 01/16/2015   Insomnia 02/03/2014   S/P peripheral artery angioplasty - TurboHawk atherectomy; R SFA 09/11/2013    Class: Acute   Leg pain, bilateral 08/19/2013   Hypothyroidism 07/31/2013   History of cocaine abuse ([name]) 06/13/2013   Long term current use of anticoagulant therapy 05/20/2013   Alcohol abuse    Narcotic abuse ([name])    Marijuana abuse    Alcoholic cirrhosis ([name])    Insulin  dependent type 2 diabetes mellitus ([name])     Current Outpatient Medications:    acetaminophen  (TYLENOL ) 500 MG tablet, Take 1,000 mg by mouth every 6 (six) hours as needed for headache (pain)., Disp: , Rfl:    albuterol  (VENTOLIN  HFA) 108 (90 Base) MCG/ACT inhaler, USE 2 PUFFS BY MOUTH EVERY FOUR HOURS, AS NEEDED, FOR COUGHING/WHEEZING, Disp: 8.5 g, Rfl: 0   allopurinol  (ZYLOPRIM ) 100  MG tablet, Take 150 mg by mouth daily., Disp: , Rfl:    amiodarone  (PACERONE ) 200 MG tablet, Take 0.5 tablets (100 mg total) by mouth daily., Disp: 30 tablet, Rfl: 1   apixaban  (ELIQUIS ) 5 MG TABS tablet, Take 1 tablet (5 mg total) by mouth 2 (two) times daily., Disp: 60 tablet, Rfl:  6   budesonide -formoterol  (SYMBICORT ) 160-4.5 MCG/ACT inhaler, Inhale 2 puffs into the lungs 2 (two) times daily., Disp: 1 each, Rfl: 2   buPROPion  (WELLBUTRIN  XL) 300 MG 24 hr tablet, Take 300 mg by mouth every morning., Disp: , Rfl:    Cholecalciferol  (VITAMIN D3) 50 MCG (2000 UT) capsule, Take 1 capsule by mouth every morning., Disp: , Rfl:    colchicine  0.6 MG tablet, Take 1.2 mg by mouth See admin instructions. Take 1.2 mg for flair up and an hour later take 0.6 mg if needed, Disp: , Rfl:    Continuous Glucose Sensor (FREESTYLE LIBRE 3 PLUS SENSOR) MISC, as directed., Disp: , Rfl:    docusate sodium  (COLACE) 100 MG capsule, Take 2 capsules (200 mg total) by mouth at bedtime. (Patient not taking: Reported on 04/30/2024), Disp: 10 capsule, Rfl: 0   erythromycin ophthalmic ointment, Place 1 Application into the right eye 2 (two) times daily., Disp: , Rfl:    fluticasone  (FLONASE) 50 MCG/ACT nasal spray, Place 1 spray into both nostrils as needed for allergies or rhinitis., Disp: , Rfl:    folic acid  (FOLVITE ) 1 MG tablet, Take 1 tablet (1 mg total) by mouth daily., Disp: 30 tablet, Rfl: 0   gabapentin  (NEURONTIN ) 100 MG capsule, Take 1 capsule (100 mg total) by mouth 3 (three) times daily., Disp: 270 capsule, Rfl: 1   insulin  lispro (HUMALOG ) 100 UNIT/ML KwikPen, Inject 15 Units into the skin 3 (three) times daily with meals. Plus sliding scale, max dose 70 units /day, Disp: 25 mL, Rfl: 1   isosorbide -hydrALAZINE  (BIDIL ) 20-37.5 MG tablet, Take 1 tablet by mouth 2 (two) times daily., Disp: 180 tablet, Rfl: 3   LANTUS  SOLOSTAR 100 UNIT/ML Solostar Pen, Inject 30 Units into the skin daily., Disp: 15 mL, Rfl: 0   levothyroxine  (SYNTHROID ) 50 MCG tablet, TAKE 1 TABLET (50 MCG TOTAL) BY MOUTH DAILY BEFORE BREAKFAST (AM), Disp: 30 tablet, Rfl: 0   Menthol -Camphor (ICY HOT PRO NO MESS EX), Apply 1 Application topically 3 (three) times daily as needed (arms, neuropathy lower extrimity, gout in hand.)., Disp:  , Rfl:    metoprolol  succinate (TOPROL -XL) 25 MG 24 hr tablet, Take 1 tablet (25 mg total) by mouth daily., Disp: 90 tablet, Rfl: 3   NYAMYC  powder, Apply 1 Application topically 2 (two) times daily., Disp: , Rfl:    nystatin  cream (MYCOSTATIN ), Apply 1 Application topically 2 (two) times daily as needed for dry skin., Disp: , Rfl:    Omega-3 Fatty Acids (FISH OIL PO), Take 1 tablet by mouth daily., Disp: , Rfl:    pantoprazole  (PROTONIX ) 40 MG tablet, TAKE 1 TABLET (40 MG TOTAL) BY MOUTH DAILY(AM), Disp: 30 tablet, Rfl: 0   polyethylene glycol (MIRALAX  / GLYCOLAX ) 17 g packet, Take 17 g by mouth daily as needed for moderate constipation., Disp: 14 each, Rfl: 0   potassium chloride  SA (KLOR-CON  M) 20 MEQ tablet, Take 2 tablets (40 mEq total) by mouth 2 (two) times daily. Take 40meq in am 20 meq in pm, Disp: 180 tablet, Rfl: 3   rosuvastatin  (CRESTOR ) 40 MG tablet, Take 1 tablet (40 mg total) by mouth daily., Disp:  90 tablet, Rfl: 3   tirzepatide  (MOUNJARO ) 10 MG/0.5ML Pen, Inject 10 mg into the skin once a week., Disp: 2 mL, Rfl: 0   torsemide  (DEMADEX ) 20 MG tablet, Take 5 tablets (100 mg total) by mouth 2 (two) times daily., Disp: 300 tablet, Rfl: 6   triamcinolone  ointment (KENALOG ) 0.1 %, Apply topically 2 (two) times daily., Disp: 454 g, Rfl: 0   VITAMIN E PO, Take 1 tablet by mouth daily., Disp: , Rfl:  Allergies  Allergen Reactions   Neurontin  [Gabapentin ] Nausea And Vomiting and Other (See Comments)    POSSIBLE SHAKING   Lyrica  [Pregabalin ] Other (See Comments)    Shaking    Other     Neurontin        Social History   Socioeconomic History   Marital status: Single    Spouse name: Not on file   Number of children: 1   Years of education: 12   Highest education level: 12th grade  Occupational History   Occupation: disabled  Tobacco Use   Smoking status: Every Day    Current packs/day: 1.00    Average packs/day: 1 pack/day for 44.0 years (44.0 ttl pk-yrs)    Types:  E-cigarettes, Cigarettes   Smokeless tobacco: Former    Types: Snuff  Vaping Use   Vaping status: Former   Devices: 11/26/2018 stopped months ago  Substance and Sexual Activity   Alcohol use: Not Currently    Comment: occ   Drug use: Yes    Types: Crack cocaine, Marijuana, Oxycodone    Sexual activity: Not Currently  Other Topics Concern   Not on file  Social History Narrative   ** Merged History Encounter **       Lives in [place], in motel with sister.  They are looking to move but don't have a place to go yet.     Social Drivers of Health   Financial Resource Strain: Low Risk  (02/09/2023)   Overall Financial Resource Strain (CARDIA)    Difficulty of Paying Living Expenses: Not hard at all  Food Insecurity: No Food Insecurity (03/09/2024)   Hunger Vital Sign    Worried About Running Out of Food in the Last Year: Never true    Ran Out of Food in the Last Year: Never true  Transportation Needs: Unmet Transportation Needs (03/09/2024)   PRAPARE - Transportation    Lack of Transportation (Medical): Yes    Lack of Transportation (Non-Medical): Yes  Physical Activity: Inactive (02/09/2023)   Exercise Vital Sign    Days of Exercise per Week: 0 days    Minutes of Exercise per Session: 0 min  Stress: No Stress Concern Present (02/09/2023)   [employer] of Occupational Health - Occupational Stress Questionnaire    Feeling of Stress : Not at all  Social Connections: Unknown (08/21/2023)   Received from [facility]   Social Network    Social Network: Not on file  Intimate Partner Violence: Not At Risk (03/09/2024)   Humiliation, Afraid, Rape, and Kick questionnaire    Fear of Current or Ex-Partner: No    Emotionally Abused: No    Physically Abused: No    Sexually Abused: No    Physical Exam      Future Appointments  Date Time Provider Department Center  05/03/2024 11:15 AM Carmelo Chock, NP [facility] None  05/06/2024  2:15 PM [facility] LAB [facility] None   05/07/2024  3:15 PM Iver Marker, RD [facility] NDM  05/21/2024  2:30 PM [facility] PA/NP [facility]  None  05/22/2024  2:20 PM Motwani, Komal, MD [facility] None  06/17/2024  1:20 PM [facility] 2 GI-315[facility]  07/30/2024  3:10 PM Thomasena Fleming, NP [facility] H&V       Alfonza Angry, Paramedic [phone] [facility] Paramedic  04/30/24

## 2024-04-30 NOTE — Progress Notes (Signed)
 Paramedicine Encounter    Patient ID: [name], female    DOB: 22-Mar-1961, 63 y.o.   MRN: [id number]   Complaints-leg pain   Edema-yes but improved   Compliance with meds-yes  Pill box filled-yes  If so, by whom-paramedic   Refills needed-colchicine , bidil , potassium   Needs bidil  placed in pill box and potassium needs to be placed where needed.   Pt reports she is doing ok. She reports her breathing is doing ok, she is frustrated this morning b/c the cardiomems is being very finicky.  She is not tolerating the gabapentin -its making her shake.  She was seen in gen cardio yesterday and her amio was decreased to 100mg .   Pt denies increased sob, no dizziness, no c/p. No bleeding issues.  Meds verified and pill box refilled.   Will come back out to place meds once pharmacy delivers.   Legs look good, still some chronic edema but much improved.   Called these meds in and should be sent out today.   CBG's-97-100 some lows around 80s and 69  BP 130/60   Pulse 67   Resp 18   LMP  (LMP Unknown)   SpO2 98%  Weight yesterday-211 Last visit weight-210   Patient Care Team: Maryellen Snare, NP as PCP - General Katheryne Pane Frederico Jan, MD as PCP - Cardiology (Cardiology) Darlis Eisenmenger, MD as PCP - Advanced Heart Failure (Cardiology) Lei Pump, MD as PCP - Electrophysiology (Cardiology) Avanell Leigh, MD as Consulting Physician (Cardiology) Diamond Formica, MD as Consulting Physician (Pulmonary Disease) System, Provider Not In  Patient Active Problem List   Diagnosis Date Noted   Persistent atrial fibrillation ([name]) 03/15/2024   Benign neoplasm of ascending colon 03/14/2024   Benign neoplasm of transverse colon 03/14/2024   Benign neoplasm of descending colon 03/14/2024   Benign neoplasm of sigmoid colon 03/14/2024   Chronic systolic congestive heart failure ([name]) 03/12/2024   Melena 03/12/2024   Heme positive stool 03/11/2024   GI bleed 03/08/2024    History of CAD (coronary artery disease) 03/08/2024   CKD (chronic kidney disease), stage IV ([name]) 03/08/2024   Continuous dependence on cigarette smoking 03/08/2024   GAD (generalized anxiety disorder) 03/08/2024   HFrEF (heart failure with reduced ejection fraction) ([name]) 10/11/2023   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) and diastolic (congestive) heart failure ([name]) 10/10/2023   Pre-ulcerative calluses 04/25/2022   PAF (paroxysmal atrial fibrillation) ([name]) 05/05/2021   COPD (chronic obstructive pulmonary disease) ([name]) 05/05/2021   OSA (obstructive sleep apnea) 05/05/2021   Stage 3b chronic kidney disease ([name]) 05/05/2021   Pressure injury of skin 05/04/2021   Diabetic nephropathy ([name]) 01/02/2021   Low back pain 06/01/2020   Hyperlipidemia 04/03/2020   Peripheral artery disease ([name]) 12/26/2019   NICM (nonischemic cardiomyopathy) ([name]) 06/20/2019   Non-healing ulcer ([name]) 06/20/2019   Coagulation disorder ([name]) 08/09/2017   Depression 07/21/2017   At risk for adverse drug reaction 06/20/2017   Peripheral neuropathy 06/20/2017   S/P transmetatarsal amputation of foot, right ([name]) 06/05/2017   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) 05/19/2017   Obesity, class 2 02/24/2016   Anticoagulation management encounter 02/10/2016   Chronic sinus bradycardia 01/12/2016   Essential hypertension 12/22/2015   Demand ischemia ([name])    Acute on chronic diastolic CHF (congestive heart failure) ([name])    Symptomatic anemia 11/08/2015   Hypokalemia 11/08/2015   Tobacco abuse 10/23/2015   Coronary artery disease  DOE (dyspnea on exertion) 04/29/2015   Paroxysmal atrial fibrillation ([name]) 01/16/2015   Carotid artery stenosis 01/16/2015   Insomnia 02/03/2014   S/P peripheral artery angioplasty - TurboHawk atherectomy; R SFA 09/11/2013    Class: Acute   Leg pain, bilateral 08/19/2013   Hypothyroidism 07/31/2013   History  of cocaine abuse ([name]) 06/13/2013   Long term current use of anticoagulant therapy 05/20/2013   Alcohol abuse    Narcotic abuse ([name])    Marijuana abuse    Alcoholic cirrhosis ([name])    Insulin  dependent type 2 diabetes mellitus ([name])     Current Outpatient Medications:    acetaminophen  (TYLENOL ) 500 MG tablet, Take 1,000 mg by mouth every 6 (six) hours as needed for headache (pain)., Disp: , Rfl:    albuterol  (VENTOLIN  HFA) 108 (90 Base) MCG/ACT inhaler, USE 2 PUFFS BY MOUTH EVERY FOUR HOURS, AS NEEDED, FOR COUGHING/WHEEZING, Disp: 8.5 g, Rfl: 0   allopurinol  (ZYLOPRIM ) 100 MG tablet, Take 150 mg by mouth daily., Disp: , Rfl:    amiodarone  (PACERONE ) 200 MG tablet, Take 0.5 tablets (100 mg total) by mouth daily., Disp: 30 tablet, Rfl: 1   apixaban  (ELIQUIS ) 5 MG TABS tablet, Take 1 tablet (5 mg total) by mouth 2 (two) times daily., Disp: 60 tablet, Rfl: 6   budesonide -formoterol  (SYMBICORT ) 160-4.5 MCG/ACT inhaler, Inhale 2 puffs into the lungs 2 (two) times daily., Disp: 1 each, Rfl: 2   buPROPion  (WELLBUTRIN  XL) 300 MG 24 hr tablet, Take 300 mg by mouth every morning., Disp: , Rfl:    Cholecalciferol  (VITAMIN D3) 50 MCG (2000 UT) capsule, Take 1 capsule by mouth every morning., Disp: , Rfl:    colchicine  0.6 MG tablet, Take 1.2 mg by mouth See admin instructions. Take 1.2 mg for flair up and an hour later take 0.6 mg if needed, Disp: , Rfl:    Continuous Glucose Sensor (FREESTYLE LIBRE 3 PLUS SENSOR) MISC, as directed., Disp: , Rfl:    erythromycin ophthalmic ointment, Place 1 Application into the right eye 2 (two) times daily., Disp: , Rfl:    fluticasone  (FLONASE) 50 MCG/ACT nasal spray, Place 1 spray into both nostrils as needed for allergies or rhinitis., Disp: , Rfl:    folic acid  (FOLVITE ) 1 MG tablet, Take 1 tablet (1 mg total) by mouth daily., Disp: 30 tablet, Rfl: 0   gabapentin  (NEURONTIN ) 100 MG capsule, Take 1 capsule (100 mg total) by mouth 3 (three) times daily., Disp: 270  capsule, Rfl: 1   insulin  lispro (HUMALOG ) 100 UNIT/ML KwikPen, Inject 15 Units into the skin 3 (three) times daily with meals. Plus sliding scale, max dose 70 units /day, Disp: 25 mL, Rfl: 1   isosorbide -hydrALAZINE  (BIDIL ) 20-37.5 MG tablet, Take 1 tablet by mouth 2 (two) times daily., Disp: 180 tablet, Rfl: 3   LANTUS  SOLOSTAR 100 UNIT/ML Solostar Pen, Inject 30 Units into the skin daily., Disp: 15 mL, Rfl: 0   levothyroxine  (SYNTHROID ) 50 MCG tablet, TAKE 1 TABLET (50 MCG TOTAL) BY MOUTH DAILY BEFORE BREAKFAST (AM), Disp: 30 tablet, Rfl: 0   Menthol -Camphor (ICY HOT PRO NO MESS EX), Apply 1 Application topically 3 (three) times daily as needed (arms, neuropathy lower extrimity, gout in hand.)., Disp: , Rfl:    metoprolol  succinate (TOPROL -XL) 25 MG 24 hr tablet, Take 1 tablet (25 mg total) by mouth daily., Disp: 90 tablet, Rfl: 3   NYAMYC  powder, Apply 1 Application topically 2 (two) times daily., Disp: , Rfl:    nystatin  cream (MYCOSTATIN ),  Apply 1 Application topically 2 (two) times daily as needed for dry skin., Disp: , Rfl:    Omega-3 Fatty Acids (FISH OIL PO), Take 1 tablet by mouth daily., Disp: , Rfl:    pantoprazole  (PROTONIX ) 40 MG tablet, TAKE 1 TABLET (40 MG TOTAL) BY MOUTH DAILY(AM), Disp: 30 tablet, Rfl: 0   polyethylene glycol (MIRALAX  / GLYCOLAX ) 17 g packet, Take 17 g by mouth daily as needed for moderate constipation., Disp: 14 each, Rfl: 0   potassium chloride  SA (KLOR-CON  M) 20 MEQ tablet, Take 2 tablets (40 mEq total) by mouth 2 (two) times daily. Take 40meq in am 20 meq in pm, Disp: 180 tablet, Rfl: 3   rosuvastatin  (CRESTOR ) 40 MG tablet, Take 1 tablet (40 mg total) by mouth daily., Disp: 90 tablet, Rfl: 3   tirzepatide  (MOUNJARO ) 10 MG/0.5ML Pen, Inject 10 mg into the skin once a week., Disp: 2 mL, Rfl: 0   torsemide  (DEMADEX ) 20 MG tablet, Take 5 tablets (100 mg total) by mouth 2 (two) times daily., Disp: 300 tablet, Rfl: 6   triamcinolone  ointment (KENALOG ) 0.1 %, Apply  topically 2 (two) times daily., Disp: 454 g, Rfl: 0   VITAMIN E PO, Take 1 tablet by mouth daily., Disp: , Rfl:    docusate sodium  (COLACE) 100 MG capsule, Take 2 capsules (200 mg total) by mouth at bedtime. (Patient not taking: Reported on 04/30/2024), Disp: 10 capsule, Rfl: 0 Allergies  Allergen Reactions   Neurontin  [Gabapentin ] Nausea And Vomiting and Other (See Comments)    POSSIBLE SHAKING   Lyrica  [Pregabalin ] Other (See Comments)    Shaking    Other     Neurontin        Social History   Socioeconomic History   Marital status: Single    Spouse name: Not on file   Number of children: 1   Years of education: 12   Highest education level: 12th grade  Occupational History   Occupation: disabled  Tobacco Use   Smoking status: Every Day    Current packs/day: 1.00    Average packs/day: 1 pack/day for 44.0 years (44.0 ttl pk-yrs)    Types: E-cigarettes, Cigarettes   Smokeless tobacco: Former    Types: Snuff  Vaping Use   Vaping status: Former   Devices: 11/26/2018 stopped months ago  Substance and Sexual Activity   Alcohol use: Not Currently    Comment: occ   Drug use: Yes    Types: Crack cocaine, Marijuana, Oxycodone    Sexual activity: Not Currently  Other Topics Concern   Not on file  Social History Narrative   ** Merged History Encounter **       Lives in [place], in motel with sister.  They are looking to move but don't have a place to go yet.     Social Drivers of Corporate investment banker Strain: Low Risk  (02/09/2023)   Overall Financial Resource Strain (CARDIA)    Difficulty of Paying Living Expenses: Not hard at all  Food Insecurity: No Food Insecurity (03/09/2024)   Hunger Vital Sign    Worried About Running Out of Food in the Last Year: Never true    Ran Out of Food in the Last Year: Never true  Transportation Needs: Unmet Transportation Needs (03/09/2024)   PRAPARE - Transportation    Lack of Transportation (Medical): Yes    Lack of Transportation  (Non-Medical): Yes  Physical Activity: Inactive (02/09/2023)   Exercise Vital Sign    Days of Exercise per  Week: 0 days    Minutes of Exercise per Session: 0 min  Stress: No Stress Concern Present (02/09/2023)   [employer] of Occupational Health - Occupational Stress Questionnaire    Feeling of Stress : Not at all  Social Connections: Unknown (08/21/2023)   Received from [facility]   Social Network    Social Network: Not on file  Intimate Partner Violence: Not At Risk (03/09/2024)   Humiliation, Afraid, Rape, and Kick questionnaire    Fear of Current or Ex-Partner: No    Emotionally Abused: No    Physically Abused: No    Sexually Abused: No    Physical Exam      Future Appointments  Date Time Provider Department Center  05/03/2024 11:15 AM Carmelo Chock, NP [facility] None  05/06/2024  2:15 PM [facility] None  05/07/2024  3:15 PM Scotece, Leretha Rankin, RD [facility] NDM  05/21/2024  2:30 PM [facility] PA/NP [facility] None  05/22/2024  2:20 PM Motwani, Joanna Muck, MD [facility] None  06/17/2024  1:20 PM [facility] 2 GI-[facility]  07/30/2024  3:10 PM Thomasena Fleming, NP [facility] H&V       Alfonza Angry, Paramedic [phone] [facility] Paramedic  04/30/24

## 2024-04-30 NOTE — Telephone Encounter (Signed)
   Called to remind to send Cardiomems.   Ms Kastens verbalized understanding and will send reading today.   Kattie Santoyo NP-C  9:43 AM

## 2024-05-03 ENCOUNTER — Other Ambulatory Visit: Payer: Self-pay | Admitting: "Endocrinology

## 2024-05-03 ENCOUNTER — Encounter: Payer: Self-pay | Admitting: Adult Health

## 2024-05-03 ENCOUNTER — Ambulatory Visit: Admitting: Adult Health

## 2024-05-03 DIAGNOSIS — F17210 Nicotine dependence, cigarettes, uncomplicated: Secondary | ICD-10-CM | POA: Diagnosis not present

## 2024-05-03 NOTE — Progress Notes (Signed)
  Virtual Visit via Telephone Note  I connected with [name] , 05/03/24 11:25 AM by a telemedicine application and verified that I am speaking with the correct person using two identifiers.  Location: Patient: home Provider: home   I discussed the limitations of evaluation and management by telemedicine and the availability of in person appointments. The patient expressed understanding and agreed to proceed.   Shared Decision Making Visit Lung Cancer Screening Program [phone])   Eligibility: 63 y.o. Pack Years Smoking History Calculation = 49 pack years  (# packs/per year x # years smoked) Recent History of coughing up blood  no Unexplained weight loss? no ( >Than 15 pounds within the last 6 months ) Prior History Lung / other cancer no (Diagnosis within the last 5 years already requiring surveillance chest CT Scans). Smoking Status Current Smoker  Visit Components: Discussion included one or more decision making aids. YES Discussion included risk/benefits of screening. YES Discussion included potential follow up diagnostic testing for abnormal scans. YES Discussion included meaning and risk of over diagnosis. YES Discussion included meaning and risk of False Positives. YES Discussion included meaning of total radiation exposure. YES  Counseling Included: Importance of adherence to annual lung cancer LDCT screening. YES Impact of comorbidities on ability to participate in the program. YES Ability and willingness to under diagnostic treatment. YES  Smoking Cessation Counseling: Current Smokers:  Discussed importance of smoking cessation. yes Information about tobacco cessation classes and interventions provided to patient. yes Patient provided with ticket for LDCT Scan. yes Symptomatic Patient. NO Diagnosis Code: Tobacco Use Z72.0 Asymptomatic Patient yes  Counseling - 4 minutes of smoking cessation counseling (CT Chest Lung Cancer Screening Low Dose W/O CM)  [id number]  Z12.2-Screening of respiratory organs Z87.891-Personal history of nicotine  dependence   Cullen Dose 05/03/24

## 2024-05-03 NOTE — Patient Instructions (Signed)

## 2024-05-06 ENCOUNTER — Ambulatory Visit (HOSPITAL_COMMUNITY)
Admission: RE | Admit: 2024-05-06 | Discharge: 2024-05-06 | Disposition: A | Discharge status: Home / Self Care | Source: Ambulatory Visit | Attending: Internal Medicine | Admitting: Internal Medicine

## 2024-05-06 DIAGNOSIS — I5022 Chronic systolic (congestive) heart failure: Secondary | ICD-10-CM | POA: Insufficient documentation

## 2024-05-06 LAB — BASIC METABOLIC PANEL WITH GFR
Anion gap: 17 — ABNORMAL HIGH (ref 5–15)
BUN: 62 mg/dL — ABNORMAL HIGH (ref 8–23)
CO2: 23 mmol/L (ref 22–32)
Calcium: 9.1 mg/dL (ref 8.9–10.3)
Chloride: 100 mmol/L (ref 98–111)
Creatinine, Ser: 2.45 mg/dL — ABNORMAL HIGH (ref 0.44–1.00)
GFR, Estimated: 22 mL/min — ABNORMAL LOW (ref >60)
Glucose, Bld: 211 mg/dL — ABNORMAL HIGH (ref 70–99)
Potassium: 5 mmol/L (ref 3.5–5.1)
Sodium: 140 mmol/L (ref 135–145)

## 2024-05-07 ENCOUNTER — Other Ambulatory Visit (HOSPITAL_COMMUNITY): Payer: Self-pay

## 2024-05-07 ENCOUNTER — Encounter: Payer: Self-pay | Admitting: Skilled Nursing Facility1

## 2024-05-07 ENCOUNTER — Encounter: Attending: "Endocrinology | Admitting: Skilled Nursing Facility1

## 2024-05-07 DIAGNOSIS — E119 Type 2 diabetes mellitus without complications: Secondary | ICD-10-CM | POA: Insufficient documentation

## 2024-05-07 DIAGNOSIS — N184 Chronic kidney disease, stage 4 (severe): Secondary | ICD-10-CM | POA: Insufficient documentation

## 2024-05-07 MED ORDER — TORSEMIDE 20 MG PO TABS: 80.0000 mg | ORAL_TABLET | Freq: Two times a day (BID) | ORAL | Status: DC

## 2024-05-07 NOTE — Progress Notes (Signed)
 Paramedicine Encounter    Patient ID: [name], female    DOB: [birth year], 62 y.o.   MRN: [id number]  Complaints-low cbg's  Edema-yes   Compliance with meds-yes  Pill box filled-yes  If so, by whom-paramedic   Refills needed-  See if these can be 90 days  Gabapentin  Levothyroxine  Allopurinol  Eliquis   Wellbutrin  Folic acid   Lantus  Metoprolol  Pantoprazole   Torsemide     Potassium-does pharm have updated rx?    Pt reports she has been doing ok, she denies c/p, no increased sob, no dizziness. Appetite ok.  No bleeding issues.  Taking meds.  She reports her cbg's have been running low, her insulin  got changed recently.  Her potassium had 2 different instructions-emer at clinic confirmed she is to take 40meq bid.  Meds verified and pill box refilled.   Her freestyle sensors keep coming off, I told her to call the company and see if they can send her more sensors.  The meter they got OTC and strips aren't working and [facility] wont take it back. I dont think they match the meter with the strips. They are going to take the meter back to [facility] and find the ones that match it.  Lungs clear.  Her legs look better with edema but still swollen.  Her arms and legs are discolored-almost like a bad tan--they aren't bruises, she isnt sure why her arms are tanned like that. Her legs look similar too.   CBG EMS-245  BP 130/60   Pulse (!) 59   Resp 18   Wt 208 lb (94.3 kg)   LMP  (LMP Unknown)   SpO2 97%   BMI 36.85 kg/m  Weight yesterday-? Last visit weight-211  Patient Care Team: Campbell Reynolds, NP as PCP - General Court Dorn PARAS, MD as PCP - Cardiology (Cardiology) Rolan Ezra GORMAN, MD as PCP - Advanced Heart Failure (Cardiology) Inocencio Soyla Lunger, MD as PCP - Electrophysiology (Cardiology) Court Dorn PARAS, MD as Consulting Physician (Cardiology) Darlean Ozell NOVAK, MD as Consulting Physician (Pulmonary Disease) System, Provider Not In  Patient Active Problem  List   Diagnosis Date Noted   Persistent atrial fibrillation ([name]) 03/15/2024   Benign neoplasm of ascending colon 03/14/2024   Benign neoplasm of transverse colon 03/14/2024   Benign neoplasm of descending colon 03/14/2024   Benign neoplasm of sigmoid colon 03/14/2024   Chronic systolic congestive heart failure ([name]) 03/12/2024   Melena 03/12/2024   Heme positive stool 03/11/2024   GI bleed 03/08/2024   History of CAD (coronary artery disease) 03/08/2024   CKD (chronic kidney disease), stage IV ([name]) 03/08/2024   Continuous dependence on cigarette smoking 03/08/2024   GAD (generalized anxiety disorder) 03/08/2024   HFrEF (heart failure with reduced ejection fraction) ([name]) 10/11/2023   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) and diastolic (congestive) heart failure ([name]) 10/10/2023   Pre-ulcerative calluses 04/25/2022   PAF (paroxysmal atrial fibrillation) ([name]) 05/05/2021   COPD (chronic obstructive pulmonary disease) ([name]) 05/05/2021   OSA (obstructive sleep apnea) 05/05/2021   Stage 3b chronic kidney disease ([name]) 05/05/2021   Pressure injury of skin 05/04/2021   Diabetic nephropathy ([name]) 01/02/2021   Low back pain 06/01/2020   Hyperlipidemia 04/03/2020   Peripheral artery disease ([name]) 12/26/2019   NICM (nonischemic cardiomyopathy) ([name]) 06/20/2019   Non-healing ulcer ([name]) 06/20/2019   Coagulation disorder ([name]) 08/09/2017   Depression 07/21/2017   At risk for adverse drug reaction 06/20/2017   Peripheral neuropathy 06/20/2017   S/P transmetatarsal amputation of foot, right (  [name]) 06/05/2017   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) 05/19/2017   Obesity, class 2 02/24/2016   Anticoagulation management encounter 02/10/2016   Chronic sinus bradycardia 01/12/2016   Essential hypertension 12/22/2015   Demand ischemia ([name])    Acute on chronic diastolic CHF (congestive heart failure) ([name])    Symptomatic  anemia 11/08/2015   Hypokalemia 11/08/2015   Tobacco abuse 10/23/2015   Coronary artery disease    DOE (dyspnea on exertion) 04/29/2015   Paroxysmal atrial fibrillation ([name]) 01/16/2015   Carotid artery stenosis 01/16/2015   Insomnia 02/03/2014   S/P peripheral artery angioplasty - TurboHawk atherectomy; R SFA 09/11/2013    Class: Acute   Leg pain, bilateral 08/19/2013   Hypothyroidism 07/31/2013   History of cocaine abuse ([name]) 06/13/2013   Long term current use of anticoagulant therapy 05/20/2013   Alcohol abuse    Narcotic abuse ([name])    Marijuana abuse    Alcoholic cirrhosis ([name])    Insulin  dependent type 2 diabetes mellitus ([name])     Current Outpatient Medications:    acetaminophen  (TYLENOL ) 500 MG tablet, Take 1,000 mg by mouth every 6 (six) hours as needed for headache (pain)., Disp: , Rfl:    albuterol  (VENTOLIN  HFA) 108 (90 Base) MCG/ACT inhaler, USE 2 PUFFS BY MOUTH EVERY FOUR HOURS, AS NEEDED, FOR COUGHING/WHEEZING, Disp: 8.5 g, Rfl: 0   allopurinol  (ZYLOPRIM ) 100 MG tablet, Take 150 mg by mouth daily., Disp: , Rfl:    amiodarone  (PACERONE ) 200 MG tablet, Take 0.5 tablets (100 mg total) by mouth daily., Disp: 30 tablet, Rfl: 1   apixaban  (ELIQUIS ) 5 MG TABS tablet, Take 1 tablet (5 mg total) by mouth 2 (two) times daily., Disp: 60 tablet, Rfl: 6   budesonide -formoterol  (SYMBICORT ) 160-4.5 MCG/ACT inhaler, Inhale 2 puffs into the lungs 2 (two) times daily., Disp: 1 each, Rfl: 2   buPROPion  (WELLBUTRIN  XL) 300 MG 24 hr tablet, Take 300 mg by mouth every morning., Disp: , Rfl:    Cholecalciferol  (VITAMIN D3) 50 MCG (2000 UT) capsule, Take 1 capsule by mouth every morning., Disp: , Rfl:    colchicine  0.6 MG tablet, Take 1.2 mg by mouth See admin instructions. Take 1.2 mg for flair up and an hour later take 0.6 mg if needed, Disp: , Rfl:    Continuous Glucose Sensor (FREESTYLE LIBRE 3 PLUS SENSOR) MISC, as directed., Disp: , Rfl:    erythromycin ophthalmic ointment, Place 1  Application into the right eye 2 (two) times daily., Disp: , Rfl:    fluticasone  (FLONASE) 50 MCG/ACT nasal spray, Place 1 spray into both nostrils as needed for allergies or rhinitis., Disp: , Rfl:    folic acid  (FOLVITE ) 1 MG tablet, Take 1 tablet (1 mg total) by mouth daily., Disp: 30 tablet, Rfl: 0   gabapentin  (NEURONTIN ) 100 MG capsule, Take 1 capsule (100 mg total) by mouth 3 (three) times daily., Disp: 270 capsule, Rfl: 1   insulin  lispro (HUMALOG ) 100 UNIT/ML KwikPen, Inject 15 Units into the skin 3 (three) times daily with meals. Plus sliding scale, max dose 70 units /day (Patient taking differently: Inject 15 Units into the skin 3 (three) times daily with meals. Sliding scale- 15u if under 300   30U if over 300), Disp: 25 mL, Rfl: 1   isosorbide -hydrALAZINE  (BIDIL ) 20-37.5 MG tablet, Take 1 tablet by mouth 2 (two) times daily., Disp: 180 tablet, Rfl: 3   LANTUS  SOLOSTAR 100 UNIT/ML Solostar Pen, Inject 30 Units  into the skin daily. (Patient taking differently: Inject 58 Units into the skin daily.), Disp: 15 mL, Rfl: 0   levothyroxine  (SYNTHROID ) 50 MCG tablet, TAKE 1 TABLET (50 MCG TOTAL) BY MOUTH DAILY BEFORE BREAKFAST (AM), Disp: 30 tablet, Rfl: 0   metoprolol  succinate (TOPROL -XL) 25 MG 24 hr tablet, Take 1 tablet (25 mg total) by mouth daily., Disp: 90 tablet, Rfl: 3   Omega-3 Fatty Acids (FISH OIL PO), Take 1 tablet by mouth daily., Disp: , Rfl:    pantoprazole  (PROTONIX ) 40 MG tablet, TAKE 1 TABLET (40 MG TOTAL) BY MOUTH DAILY(AM), Disp: 30 tablet, Rfl: 0   polyethylene glycol (MIRALAX  / GLYCOLAX ) 17 g packet, Take 17 g by mouth daily as needed for moderate constipation., Disp: 14 each, Rfl: 0   potassium chloride  SA (KLOR-CON  M) 20 MEQ tablet, Take 2 tablets (40 mEq total) by mouth 2 (two) times daily. Take 40meq in am 20 meq in pm, Disp: 180 tablet, Rfl: 3   rosuvastatin  (CRESTOR ) 40 MG tablet, Take 1 tablet (40 mg total) by mouth daily., Disp: 90 tablet, Rfl: 3   tirzepatide   (MOUNJARO ) 10 MG/0.5ML Pen, Inject 10 mg into the skin once a week. (Patient taking differently: Inject 7.5 mg into the skin once a week.), Disp: 2 mL, Rfl: 0   torsemide  (DEMADEX ) 20 MG tablet, Take 4 tablets (80 mg total) by mouth 2 (two) times daily., Disp: , Rfl:    VITAMIN E PO, Take 1 tablet by mouth daily., Disp: , Rfl:    docusate sodium  (COLACE) 100 MG capsule, Take 2 capsules (200 mg total) by mouth at bedtime. (Patient not taking: Reported on 05/07/2024), Disp: 10 capsule, Rfl: 0   Menthol -Camphor (ICY HOT PRO NO MESS EX), Apply 1 Application topically 3 (three) times daily as needed (arms, neuropathy lower extrimity, gout in hand.)., Disp: , Rfl:    NYAMYC  powder, Apply 1 Application topically 2 (two) times daily., Disp: , Rfl:    nystatin  cream (MYCOSTATIN ), Apply 1 Application topically 2 (two) times daily as needed for dry skin., Disp: , Rfl:    triamcinolone  ointment (KENALOG ) 0.1 %, Apply topically 2 (two) times daily., Disp: 454 g, Rfl: 0 Allergies  Allergen Reactions   Neurontin  [Gabapentin ] Nausea And Vomiting and Other (See Comments)    POSSIBLE SHAKING   Lyrica  [Pregabalin ] Other (See Comments)    Shaking    Other     Neurontin        Social History   Socioeconomic History   Marital status: Single    Spouse name: Not on file   Number of children: 1   Years of education: 12   Highest education level: 12th grade  Occupational History   Occupation: disabled  Tobacco Use   Smoking status: Every Day    Current packs/day: 1.00    Average packs/day: 1 pack/day for 44.0 years (44.0 ttl pk-yrs)    Types: E-cigarettes, Cigarettes   Smokeless tobacco: Former    Types: Snuff  Vaping Use   Vaping status: Former   Devices: 11/26/2018 stopped months ago  Substance and Sexual Activity   Alcohol use: Not Currently    Comment: occ   Drug use: Yes    Types: Crack cocaine, Marijuana, Oxycodone    Sexual activity: Not Currently  Other Topics Concern   Not on file   Social History Narrative   ** Merged History Encounter **       Lives in [place], in motel with sister.  They are looking to move  but don't have a place to go yet.     Social Drivers of Health   Financial Resource Strain: Low Risk  (02/09/2023)   Overall Financial Resource Strain (CARDIA)    Difficulty of Paying Living Expenses: Not hard at all  Food Insecurity: No Food Insecurity (03/09/2024)   Hunger Vital Sign    Worried About Running Out of Food in the Last Year: Never true    Ran Out of Food in the Last Year: Never true  Transportation Needs: Unmet Transportation Needs (03/09/2024)   PRAPARE - Transportation    Lack of Transportation (Medical): Yes    Lack of Transportation (Non-Medical): Yes  Physical Activity: Inactive (02/09/2023)   Exercise Vital Sign    Days of Exercise per Week: 0 days    Minutes of Exercise per Session: 0 min  Stress: No Stress Concern Present (02/09/2023)   [employer] of Occupational Health - Occupational Stress Questionnaire    Feeling of Stress : Not at all  Social Connections: Unknown (08/21/2023)   Received from [facility]   Social Network    Social Network: Not on file  Intimate Partner Violence: Not At Risk (03/09/2024)   Humiliation, Afraid, Rape, and Kick questionnaire    Fear of Current or Ex-Partner: No    Emotionally Abused: No    Physically Abused: No    Sexually Abused: No    Physical Exam      Future Appointments  Date Time Provider Department Center  05/07/2024  3:15 PM Norberto Thersia NOVAK, RD [facility]  05/21/2024  2:30 PM [facility] PA/NP [facility] None  05/22/2024  2:20 PM Dartha Ernst, MD [facility] None  06/17/2024  1:20 PM [facility]  07/30/2024  3:10 PM Aniceto Daphne CROME, NP [facility] H[facility]       [name], Paramedic [phone] [facility] Paramedic  05/07/24

## 2024-05-07 NOTE — Progress Notes (Signed)
 DM: Pt stated insulin  regimen:  Lantus  58 units at bedtime, increase by 1 unit unless fasting blood sugar is in 120s Humalog  10-15 units tid 30 min before meals Pt state she was advised by her doctor to take 4 units when her blood sugars are over 300 stating she has not done that in 3-4 weeks stating it has been low (no symptoms reported reporting a blood sugar of 58 but then states her glucometer stopped working after that)   Pt states she does not take her meal time insulin  if her blood sugars are 100: Dietitian asked how she knew to take or not to take her insulin  without a glucometer pt states it stopped working last night: throughout conversation pt was unable to recall when or how often she checked her blood sugars and when she actually had a glucometer to use.   Pt states she cannot read or write.   Current blood sugar reading unknown Pt states she has had diabetes about 12 years.   Labs: A1C 12.6 BUN 26 Creatinine 2.45 GFR 22  Pt states her sister typically makes her meals but has not lately stating her sister has memory issues. Pt states she cannot stand for long periods of time so she cannot make her own meals. Pt states she wants an electric wheelchair stating she was advised she does not need one as she needs to focus on walking more often.   Pt states she always changes her needles and changes her insulin  injection sites.   Pt states she is not thinking straight currently. Pt states she sleeps in a recliner. Pt states she likes a lot of chocolate candy. Pt states she has dentures but they do not fit well so she does not wear them.   Pts living situation is not ideal.   Pt state she is tired from the heat so not thinking straight and traveling all day in a car with no AC.  Pt states her sensors fell off stating she is going to get some more from the company.  Pt states she bought the wrong strips for her glucometer and [facility] would not take them back so could not buy the  right ones. Dietitian gave pt glucometer in the in between time with extra strips: exp: 01/04/2025 lot: [id number]   All education materials given are pictographs as pt reports unable to read.   24 hr recall: First meal: Bologna sandwich Snack: banana or peanut butter jelly sandwich Ham sandwich and chips Snack: Roast and carrots  Beverages: kool aid, unsweet tea, coffee + creamer + sweet n low, water , boost sometimes   Nutrition Education: A whole sandwich is not a snack that is a meal do not eat sandwiches in between meals Creation of balanced and diverse meals to increase the intake of nutrient-rich foods that provide essential vitamins, minerals, fiber, and phytonutrients Variety of Fruits and Vegetables: Aim for a colorful array of fruits and vegetables to ensure a wide range of nutrients. Include a mix of leafy greens, berries, citrus fruits, cruciferous vegetables, and more. Whole Grains: Choose whole grains over refined grains. Examples include brown rice, quinoa, oats, whole wheat, and barley. Lean Proteins: Include lean sources of protein, such as poultry, fish, tofu, legumes, beans, lentils, and low-fat dairy products. Limit red and processed meats. Healthy Fats: Incorporate sources of healthy fats, including avocados, nuts, seeds, and olive oil. Limit saturated and trans fats found in fried and processed foods. Dairy or Dairy Alternatives: Choose low-fat or fat-free  dairy products, or plant-based alternatives like almond or soy milk. Portion Control: Be mindful of portion sizes to avoid overeating. Pay attention to hunger and satisfaction cues. Limit Added Sugars: Minimize the consumption of sugary beverages, snacks, and desserts. Check food labels for added sugars and opt for natural sources of sweetness such as whole fruits. Hydration: Drink plenty of water  throughout the day. Limit sugary drinks and excessive caffeine intake. Moderate Sodium Intake: Reduce the  consumption of high-sodium foods. Use herbs and spices for flavor instead of excessive salt. Meal Planning and Preparation: Plan and prepare meals ahead of time to make healthier choices more convenient. Include a mix of food groups in each meal. Limit Processed Foods: Minimize the intake of highly processed and packaged foods that are often high in added sugars, salt, and unhealthy fats. Regular Physical Activity: Combine a healthy diet with regular physical activity for overall well-being. Aim for at least 150 minutes of moderate-intensity aerobic exercise per week, along with strength training. Moderation and Balance: Enjoy treats and indulgent foods in moderation, emphasizing balance rather than strict restriction.  As soon as you get home call your doctor: Debby Robertson letting them know you are now ready to take advantage of that PT referral    PES Statement: Poor diabetes self-management related to impaired memory and lack of readiness to adopt lifestyle modifications (e.g., physical activity and dietary changes)  as evidenced by elevated HbA1c levels, self-reported sedentary behavior, and expressed unwillingness to engage in recommended diabetes management strategies

## 2024-05-09 ENCOUNTER — Other Ambulatory Visit: Payer: Self-pay | Admitting: Student

## 2024-05-09 DIAGNOSIS — Z1231 Encounter for screening mammogram for malignant neoplasm of breast: Secondary | ICD-10-CM

## 2024-05-09 NOTE — Telephone Encounter (Signed)
 Requested Prescriptions   Pending Prescriptions Disp Refills   MOUNJARO  7.5 MG/0.5ML Pen [Pharmacy Med Name: MOUNJARO  7.5 MG/0.5ML SUBCUTANEOUS SOLUTION AUTO-INJECTOR] 6 mL 0    Sig: INJECT 7.5 MG INTO THE SKIN ONCE A WEEK.

## 2024-05-13 ENCOUNTER — Telehealth (HOSPITAL_COMMUNITY): Payer: Self-pay

## 2024-05-13 NOTE — Telephone Encounter (Signed)
 Contacted [facility] to request the following for refills:   Gabapentin  Levothyroxine  Allopurinol  Eliquis   Wellbutrin  Folic acid   Lantus  Metoprolol  Pantoprazole   Torsemide   Potassium     [name], Paramedic [phone] 05/13/24

## 2024-05-14 ENCOUNTER — Other Ambulatory Visit (HOSPITAL_COMMUNITY): Payer: Self-pay

## 2024-05-14 NOTE — Progress Notes (Signed)
 Paramedicine Encounter    Patient ID: [name], female    DOB: [birth year], 62 y.o.   MRN: [id number]   Complaints-chronic foot/leg pain   Edema-yes, chronic   Compliance with meds-yes  Pill box filled-yes  If so, by whom-paramedic   Refills needed-  Waiting on folic acid  from doc to be refilled-she has it thru sat am dose   She feels like her legs are burning worse than usual. She feels that they are more red than usual, they are not hot to touch, does not appear more red to me, but she is going for f/u on that next week.  No increased sob.   Lungs clear. No c/p, no dizziness.  Taking all her meds.   Meds verified and pill box refilled.    Reminded her of appointments next week.   BP (!) 142/70   Pulse 66   Resp 18   Wt 209 lb (94.8 kg)   LMP  (LMP Unknown)   SpO2 98%   BMI 37.02 kg/m  Weight yesterday-? Last visit weight-208  Patient Care Team: Campbell Reynolds, NP as PCP - [facility] Court Dorn PARAS, MD as PCP - Cardiology (Cardiology) Rolan Ezra GORMAN, MD as PCP - Advanced Heart Failure (Cardiology) Inocencio Soyla Lunger, MD as PCP - Electrophysiology (Cardiology) Court Dorn PARAS, MD as Consulting Physician (Cardiology) Darlean Ozell NOVAK, MD as Consulting Physician (Pulmonary Disease) System, Provider Not In  Patient Active Problem List   Diagnosis Date Noted   Persistent atrial fibrillation ([name]) 03/15/2024   Benign neoplasm of ascending colon 03/14/2024   Benign neoplasm of transverse colon 03/14/2024   Benign neoplasm of descending colon 03/14/2024   Benign neoplasm of sigmoid colon 03/14/2024   Chronic systolic congestive heart failure ([name]) 03/12/2024   Melena 03/12/2024   Heme positive stool 03/11/2024   GI bleed 03/08/2024   History of CAD (coronary artery disease) 03/08/2024   CKD (chronic kidney disease), stage IV ([name]) 03/08/2024   Continuous dependence on cigarette smoking 03/08/2024   GAD (generalized anxiety disorder) 03/08/2024   HFrEF  (heart failure with reduced ejection fraction) ([name]) 10/11/2023   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) and diastolic (congestive) heart failure ([name]) 10/10/2023   Pre-ulcerative calluses 04/25/2022   PAF (paroxysmal atrial fibrillation) ([name]) 05/05/2021   COPD (chronic obstructive pulmonary disease) ([name]) 05/05/2021   OSA (obstructive sleep apnea) 05/05/2021   Stage 3b chronic kidney disease ([name]) 05/05/2021   Pressure injury of skin 05/04/2021   Diabetic nephropathy ([name]) 01/02/2021   Low back pain 06/01/2020   Hyperlipidemia 04/03/2020   Peripheral artery disease ([name]) 12/26/2019   NICM (nonischemic cardiomyopathy) ([name]) 06/20/2019   Non-healing ulcer ([name]) 06/20/2019   Coagulation disorder ([name]) 08/09/2017   Depression 07/21/2017   At risk for adverse drug reaction 06/20/2017   Peripheral neuropathy 06/20/2017   S/P transmetatarsal amputation of foot, right ([name]) 06/05/2017   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) 05/19/2017   Obesity, class 2 02/24/2016   Anticoagulation management encounter 02/10/2016   Chronic sinus bradycardia 01/12/2016   Essential hypertension 12/22/2015   Demand ischemia ([name])    Acute on chronic diastolic CHF (congestive heart failure) ([name])    Symptomatic anemia 11/08/2015   Hypokalemia 11/08/2015   Tobacco abuse 10/23/2015   Coronary artery disease    DOE (dyspnea on exertion) 04/29/2015   Paroxysmal atrial fibrillation ([name]) 01/16/2015   Carotid artery stenosis 01/16/2015   Insomnia 02/03/2014   S/P peripheral artery  angioplasty - [place]boHawk atherectomy; R SFA 09/11/2013    Class: Acute   Leg pain, bilateral 08/19/2013   Hypothyroidism 07/31/2013   History of cocaine abuse ([name]) 06/13/2013   Long term current use of anticoagulant therapy 05/20/2013   Alcohol abuse    Narcotic abuse ([name])    Marijuana abuse    Alcoholic cirrhosis ([name])    Insulin  dependent type 2 diabetes  mellitus ([name])     Current Outpatient Medications:    acetaminophen  (TYLENOL ) 500 MG tablet, Take 1,000 mg by mouth every 6 (six) hours as needed for headache (pain)., Disp: , Rfl:    albuterol  (VENTOLIN  HFA) 108 (90 Base) MCG/ACT inhaler, USE 2 PUFFS BY MOUTH EVERY FOUR HOURS, AS NEEDED, FOR COUGHING/WHEEZING, Disp: 8.5 g, Rfl: 0   allopurinol  (ZYLOPRIM ) 100 MG tablet, Take 150 mg by mouth daily., Disp: , Rfl:    amiodarone  (PACERONE ) 200 MG tablet, Take 0.5 tablets (100 mg total) by mouth daily., Disp: 30 tablet, Rfl: 1   apixaban  (ELIQUIS ) 5 MG TABS tablet, Take 1 tablet (5 mg total) by mouth 2 (two) times daily., Disp: 60 tablet, Rfl: 6   budesonide -formoterol  (SYMBICORT ) 160-4.5 MCG/ACT inhaler, Inhale 2 puffs into the lungs 2 (two) times daily., Disp: 1 each, Rfl: 2   buPROPion  (WELLBUTRIN  XL) 300 MG 24 hr tablet, Take 300 mg by mouth every morning., Disp: , Rfl:    Cholecalciferol  (VITAMIN D3) 50 MCG (2000 UT) capsule, Take 1 capsule by mouth every morning., Disp: , Rfl:    colchicine  0.6 MG tablet, Take 1.2 mg by mouth See admin instructions. Take 1.2 mg for flair up and an hour later take 0.6 mg if needed, Disp: , Rfl:    Continuous Glucose Sensor (FREESTYLE LIBRE 3 PLUS SENSOR) MISC, as directed., Disp: , Rfl:    fluticasone  (FLONASE) 50 MCG/ACT nasal spray, Place 1 spray into both nostrils as needed for allergies or rhinitis., Disp: , Rfl:    folic acid  (FOLVITE ) 1 MG tablet, Take 1 tablet (1 mg total) by mouth daily., Disp: 30 tablet, Rfl: 0   gabapentin  (NEURONTIN ) 100 MG capsule, Take 1 capsule (100 mg total) by mouth 3 (three) times daily., Disp: 270 capsule, Rfl: 1   isosorbide -hydrALAZINE  (BIDIL ) 20-37.5 MG tablet, Take 1 tablet by mouth 2 (two) times daily., Disp: 180 tablet, Rfl: 3   levothyroxine  (SYNTHROID ) 50 MCG tablet, TAKE 1 TABLET (50 MCG TOTAL) BY MOUTH DAILY BEFORE BREAKFAST (AM), Disp: 30 tablet, Rfl: 0   metoprolol  succinate (TOPROL -XL) 25 MG 24 hr tablet, Take 1  tablet (25 mg total) by mouth daily., Disp: 90 tablet, Rfl: 3   MOUNJARO  7.5 MG/0.5ML Pen, INJECT 7.5 MG INTO THE SKIN ONCE A WEEK., Disp: 6 mL, Rfl: 0   Omega-3 Fatty Acids (FISH OIL PO), Take 1 tablet by mouth daily., Disp: , Rfl:    pantoprazole  (PROTONIX ) 40 MG tablet, TAKE 1 TABLET (40 MG TOTAL) BY MOUTH DAILY(AM), Disp: 30 tablet, Rfl: 0   potassium chloride  SA (KLOR-CON  M) 20 MEQ tablet, Take 2 tablets (40 mEq total) by mouth 2 (two) times daily. Take 40meq in am 20 meq in pm, Disp: 180 tablet, Rfl: 3   rosuvastatin  (CRESTOR ) 40 MG tablet, Take 1 tablet (40 mg total) by mouth daily., Disp: 90 tablet, Rfl: 3   torsemide  (DEMADEX ) 20 MG tablet, Take 4 tablets (80 mg total) by mouth 2 (two) times daily., Disp: , Rfl:    VITAMIN E PO, Take 1 tablet by mouth daily., Disp: ,  Rfl:    docusate sodium  (COLACE) 100 MG capsule, Take 2 capsules (200 mg total) by mouth at bedtime. (Patient not taking: Reported on 05/07/2024), Disp: 10 capsule, Rfl: 0   erythromycin ophthalmic ointment, Place 1 Application into the right eye 2 (two) times daily., Disp: , Rfl:    insulin  lispro (HUMALOG ) 100 UNIT/ML KwikPen, Inject 15 Units into the skin 3 (three) times daily with meals. Plus sliding scale, max dose 70 units /day (Patient taking differently: Inject 15 Units into the skin 3 (three) times daily with meals. Sliding scale- 15u if under 300   30U if over 300), Disp: 25 mL, Rfl: 1   LANTUS  SOLOSTAR 100 UNIT/ML Solostar Pen, Inject 30 Units into the skin daily. (Patient taking differently: Inject 58 Units into the skin daily.), Disp: 15 mL, Rfl: 0   Menthol -Camphor (ICY HOT PRO NO MESS EX), Apply 1 Application topically 3 (three) times daily as needed (arms, neuropathy lower extrimity, gout in hand.)., Disp: , Rfl:    NYAMYC  powder, Apply 1 Application topically 2 (two) times daily., Disp: , Rfl:    nystatin  cream (MYCOSTATIN ), Apply 1 Application topically 2 (two) times daily as needed for dry skin., Disp: , Rfl:     polyethylene glycol (MIRALAX  / GLYCOLAX ) 17 g packet, Take 17 g by mouth daily as needed for moderate constipation., Disp: 14 each, Rfl: 0   tirzepatide  (MOUNJARO ) 10 MG/0.5ML Pen, Inject 10 mg into the skin once a week. (Patient taking differently: Inject 7.5 mg into the skin once a week.), Disp: 2 mL, Rfl: 0   triamcinolone  ointment (KENALOG ) 0.1 %, Apply topically 2 (two) times daily., Disp: 454 g, Rfl: 0 Allergies  Allergen Reactions   Neurontin  [Gabapentin ] Nausea And Vomiting and Other (See Comments)    POSSIBLE SHAKING   Lyrica  Coral.Conradi ] Other (See Comments)    Shaking    Other     Neurontin        Social History   Socioeconomic History   Marital status: Single    Spouse name: Not on file   Number of children: 1   Years of education: 32   Highest education level: 12th grade  Occupational History   Occupation: disabled  Tobacco Use   Smoking status: Every Day    Current packs/day: 1.00    Average packs/day: 1 pack/day for 44.0 years (44.0 ttl pk-yrs)    Types: E-cigarettes, Cigarettes   Smokeless tobacco: Former    Types: Snuff  Vaping Use   Vaping status: Former   Devices: 11/26/2018 stopped months ago  Substance and Sexual Activity   Alcohol use: Not Currently    Comment: occ   Drug use: Yes    Types: Crack cocaine, Marijuana, Oxycodone    Sexual activity: Not Currently  Other Topics Concern   Not on file  Social History Narrative   ** Merged History Encounter **       Lives in [place], in motel with sister.  They are looking to move but don't have a place to go yet.     Social Drivers of Corporate investment banker Strain: Low Risk  (02/09/2023)   Overall Financial Resource Strain (CARDIA)    Difficulty of Paying Living Expenses: Not hard at all  Food Insecurity: No Food Insecurity (03/09/2024)   Hunger Vital Sign    Worried About Running Out of Food in the Last Year: Never true    Ran Out of Food in the Last Year: Never true  Transportation Needs:  Unmet  Transportation Needs (03/09/2024)   PRAPARE - Administrator, Civil Service (Medical): Yes    Lack of Transportation (Non-Medical): Yes  Physical Activity: Inactive (02/09/2023)   Exercise Vital Sign    Days of Exercise per Week: 0 days    Minutes of Exercise per Session: 0 min  Stress: No Stress Concern Present (02/09/2023)   [employer] of Occupational Health - Occupational Stress Questionnaire    Feeling of Stress : Not at all  Social Connections: Unknown (08/21/2023)   Received from [facility]   Social Network    Social Network: Not on file  Intimate Partner Violence: Not At Risk (03/09/2024)   Humiliation, Afraid, Rape, and Kick questionnaire    Fear of Current or Ex-Partner: No    Emotionally Abused: No    Physically Abused: No    Sexually Abused: No    Physical Exam      Future Appointments  Date Time Provider Department Center  05/21/2024  2:30 PM [facility] PA/NP [facility] None  05/22/2024  2:20 PM Dartha Ernst, MD [facility] None  05/24/2024 11:00 AM Johnston Delon CROME, PT [facility]  06/11/2024 10:40 AM [facility] MM 2 [facility]GMM GI-BREAST CE  06/17/2024  1:20 PM GI-315 CT 2 GI-315CT GI-[address] [facility]  07/30/2024  3:10 PM Aniceto Daphne CROME, NP [facility] H&V       [name], Paramedic [phone] [facility] Paramedic  05/14/24

## 2024-05-20 ENCOUNTER — Telehealth (HOSPITAL_COMMUNITY): Payer: Self-pay | Admitting: *Deleted

## 2024-05-20 NOTE — Progress Notes (Signed)
 Patient ID: [name], female   DOB: [birth year], 63 y.o.   MRN: [id number]    Advanced Heart Failure Clinic Note   PCP: [facility] PV: Dr. Court HF Cardiology: Dr. Rolan  HPI: [name] is a 63 y.o. with history of PAD, carotid stenosis s/p left CEA, CAD, chronic systolic CHF, paroxysmal atrial fibrillation, bradycardia, and prior substance abuse.   In 5/18, she had peripheral angiography with atherectomy of right SFA.  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 In 8/20, she had peripheral angiography showing totally occluded left SFA.  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 Oris.    Echo 1/21 EF 50%, mild LVH, normal RV, PASP 60 mmHg.  Echo 4/22 EF 55-60% with basal to mid inferolateral hypokinesis.   Underwent PV angiogram with Dr. Court 7/22. She had drug-coated balloon angioplasty of the right SFA after being admitted for critical ischemia of the right foot.  Repeat peripheral arterial dopplers in 8/22 still showed severe right SFA disease.   Echo 4/24 EF 50-55%, RV normal.  Admitted 9/24 with UTI/sepsis. Required ICU care, BiPap and abx. Underwent RLE angiogram with femoropopliteal angioplasty and stenting.  Two admissions in 11/24 for AF/RVR s/p DCCV. EF was 45-50%, nl RV. She was d/c'd on Torsemide  100 bid w/o weekly metolazone  and readmitted for volume overload. She was d/c'd with prn Metolazone , however CardioMems reading (PA 31) suggested she was not diuresed at discharge. Was instructed to use Furosicx at home. At follow up PA 36, she was switched back to Torsemide  with metolazone .  Admitted 4/24 for GIB and ABLA. Hgb 5.3 on admission. Transfused. Eliquis  held. Underwent endoscopy 4/30: Esophageal plaques were found consistent with candidiasis.  Normal stomach. Started on fluconazole .   Normal examined duodenal. No source of bleeding on endoscopy, underwent colonoscopy, multiples polyps  found and had polypectomy. GI recommended capsule endoscopy in the future if recurrence of bleeding. Resumed eliquis   5/03.  Today she returns to clinic for HF follow up. Overall feeling fine. Main complaint is shoulder pain and her glucose monitor broke. She uses a walker to walk short distances around her house. Has LE swelling. Denies palpitations, abnormal bleeding, CP, dizziness, or PND/Orthopnea. Appetite ok. Weight at home 210-225 pounds. Taking all medications. Follows with paramedicine, uses Cardiomems. Smoking 1/2 ppd.  Cardiomems goal 20, today's reading  26  Labs (8/23): K 4.7, creatinine 2.11, LDL 93, TGs 315 Labs (10/23): K 3.4, creatinine 1.35 Labs (1/24): K 4.0, creatinine 3.58 Labs (9/24): K 3.8, creatinine 1.44 Labs (11/24): K 4, creatinine 1.59 Labs (11/24): 4.2, creatinine 1.28  Labs (5/25): K 3.7, creatinine 1.78   PMH: 1. Carotid stenosis: Known occluded right carotid.  Left CEA in 4/16.  - Carotid dopplers (7/21): CTO RICA, mild disease LICA.  - Carotid dopplers (7/22): CTO RICA, 1-39% LICA.  - Carotid dopplers (3/23): Totally occluded RICA, 1-39% LICA.  2. CAD: LHC in 12/15 with 80% stenosis in small OM1, nonobstructive disease in other territories.  3. Chronic systolic CHF: Nonischemic cardiomyopathy (?due to ETOH or prior drug abuse).  She has a Cardiomems.  - Echo (1/17) with EF 45%, mild LV hypertrophy, moderate diastolic dysfunction, inferolateral severe hypokinesis, mildly decreased RV systolic function.  - Echo (1/18): EF 40-45%, mild LVH, normal RV size and systolic function.  - Echo (1/21): EF 50%, mild LVH, normal RV, PASP 60 mmHg.  - Echo (4/22): EF 55-60%, basal-mid  inferolateral hypokinesis with grade II diastolic dysfunction, RV normal, PASP 42 - Echo (4/24): EF 50-55%.  - Echo (11/24): EF 45-50%, normal RV, mild MR, anterolateral akinesis. 4. Atrial fibrillation: Paroxysmal.   - DCCV 11/24 5. Type II diabetes 6. CKD: Stage III. 7. COPD: Smokes  1/4-1/2 ppd.  8. 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  No longer drinks.  9. Hypothyroidism 10. PAD: Atherectomy SFA in 2014 (Dr Court).  Peripheral arterial dopplers (2/17) with focal 75-99% proximal right SFA stenosis, occluded mid-distal right SFA, chronic occlusion of all runoff arteries on right. - In 5/18, she had peripheral angiography with atherectomy of right SFA.   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  - 7/18 ABIs showed 0.65 on right, 0.31 on left.   - Peripheral angiography 8/20: Totally occluded left SFA.  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 Oris.   - ABIs (2/21): 0.44 R, 0.99 L.  - Balloon angioplasty right SFA 7/22.  - ABIs (8/22): severe right SFA disease.  - peripheral arterial dopplers (2/23): 50-74% right SFA and right popliteal. Stable.  - s/p RLE angiogram with femoropopliteal angioplasty and stenting 9/24 - ABIs (11/24): 0.6 on right, 1.03 on left.  11. Anemia 12. Prior cocaine abuse.  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  14. OSA: Has not been able to tolerate CPAP.  15. Diabetic neuropathy.    SH: Lives with sister.  Prior cocaine abuse.  Prior ETOH abuse.  Quit smoking in 1/17, restarted, and quit again in 4/19, restarted again.   FH: CAD  Review of systems complete and found to be negative unless listed in HPI.    Current Outpatient Medications  Medication Sig Dispense Refill   acetaminophen  (TYLENOL ) 500 MG tablet Take 1,000 mg by mouth every 6 (six) hours as needed for headache (pain).     albuterol  (VENTOLIN  HFA) 108 (90 Base) MCG/ACT inhaler USE 2 PUFFS BY MOUTH EVERY FOUR HOURS, AS NEEDED, FOR COUGHING/WHEEZING 8.5 g 0   allopurinol  (ZYLOPRIM ) 100 MG tablet Take 150 mg by mouth daily.     amiodarone  (PACERONE ) 200 MG tablet Take 0.5 tablets (100 mg total) by mouth daily. 30 tablet 1   apixaban  (ELIQUIS ) 5 MG TABS tablet Take 1  tablet (5 mg total) by mouth 2 (two) times daily. 60 tablet 6   budesonide -formoterol  (SYMBICORT ) 160-4.5 MCG/ACT inhaler Inhale 2 puffs into the lungs 2 (two) times daily. 1 each 2   buPROPion  (WELLBUTRIN  XL) 300 MG 24 hr tablet Take 300 mg by mouth every morning.     Cholecalciferol  (VITAMIN D3) 50 MCG (2000 UT) capsule Take 1 capsule by mouth every morning.     colchicine  0.6 MG tablet Take 1.2 mg by mouth See admin instructions. Take 1.2 mg for flair up and an hour later take 0.6 mg if needed     Continuous Glucose Sensor (FREESTYLE LIBRE 3 PLUS SENSOR) MISC as directed.     docusate sodium  (COLACE) 100 MG capsule Take 2 capsules (200 mg total) by mouth at bedtime. (Patient not taking: Reported on 05/07/2024) 10 capsule 0   erythromycin ophthalmic ointment Place 1 Application into the right eye 2 (two) times daily.     fluticasone  (FLONASE) 50 MCG/ACT nasal spray Place 1 spray into both nostrils as needed for allergies or rhinitis.     folic acid  (FOLVITE ) 1 MG tablet Take 1 tablet (1 mg total)  by mouth daily. 30 tablet 0   gabapentin  (NEURONTIN ) 100 MG capsule Take 1 capsule (100 mg total) by mouth 3 (three) times daily. 270 capsule 1   insulin  lispro (HUMALOG ) 100 UNIT/ML KwikPen Inject 15 Units into the skin 3 (three) times daily with meals. Plus sliding scale, max dose 70 units /day (Patient taking differently: Inject 15 Units into the skin 3 (three) times daily with meals. Sliding scale- 15u if under 300   30U if over 300) 25 mL 1   isosorbide -hydrALAZINE  (BIDIL ) 20-37.5 MG tablet Take 1 tablet by mouth 2 (two) times daily. 180 tablet 3   LANTUS  SOLOSTAR 100 UNIT/ML Solostar Pen Inject 30 Units into the skin daily. (Patient taking differently: Inject 58 Units into the skin daily.) 15 mL 0   levothyroxine  (SYNTHROID ) 50 MCG tablet TAKE 1 TABLET (50 MCG TOTAL) BY MOUTH DAILY BEFORE BREAKFAST (AM) 30 tablet 0   Menthol -Camphor (ICY HOT PRO NO MESS EX) Apply 1 Application topically 3 (three)  times daily as needed (arms, neuropathy lower extrimity, gout in hand.).     metoprolol  succinate (TOPROL -XL) 25 MG 24 hr tablet Take 1 tablet (25 mg total) by mouth daily. 90 tablet 3   MOUNJARO  7.5 MG/0.5ML Pen INJECT 7.5 MG INTO THE SKIN ONCE A WEEK. 6 mL 0   NYAMYC  powder Apply 1 Application topically 2 (two) times daily.     nystatin  cream (MYCOSTATIN ) Apply 1 Application topically 2 (two) times daily as needed for dry skin.     Omega-3 Fatty Acids (FISH OIL PO) Take 1 tablet by mouth daily.     pantoprazole  (PROTONIX ) 40 MG tablet TAKE 1 TABLET (40 MG TOTAL) BY MOUTH DAILY(AM) 30 tablet 0   polyethylene glycol (MIRALAX  / GLYCOLAX ) 17 g packet Take 17 g by mouth daily as needed for moderate constipation. 14 each 0   potassium chloride  SA (KLOR-CON  M) 20 MEQ tablet Take 2 tablets (40 mEq total) by mouth 2 (two) times daily. Take 40meq in am 20 meq in pm 180 tablet 3   rosuvastatin  (CRESTOR ) 40 MG tablet Take 1 tablet (40 mg total) by mouth daily. 90 tablet 3   tirzepatide  (MOUNJARO ) 10 MG/0.5ML Pen Inject 10 mg into the skin once a week. (Patient taking differently: Inject 7.5 mg into the skin once a week.) 2 mL 0   torsemide  (DEMADEX ) 20 MG tablet Take 4 tablets (80 mg total) by mouth 2 (two) times daily.     triamcinolone  ointment (KENALOG ) 0.1 % Apply topically 2 (two) times daily. 454 g 0   VITAMIN E PO Take 1 tablet by mouth daily.     No current facility-administered medications for this visit.   LMP  (LMP Unknown)   Wt Readings from Last 3 Encounters:  05/14/24 94.8 kg (209 lb)  05/07/24 94.3 kg (208 lb)  04/29/24 96 kg (211 lb 9.6 oz)   PHYSICAL EXAM: General:  NAD. No resp difficulty, arrived in [facility], chronically-ill appearing HEENT: Normal Neck: Supple. No JVD. Cor: Regular rate & rhythm. No rubs, gallops or murmurs. Lungs: Clear, diminished in bases. Abdomen: Soft, obese, nontender, nondistended.  Extremities: No cyanosis, clubbing, rash,2-3+ BLE pre-tibial edema +  weeping Neuro: Alert & oriented x 3, moves all 4 extremities w/o difficulty. Affect pleasant.  Assessment/Plan: 1. Chronic HF with mid range EF: LHC in 12/15 showing only 80% stenosis in small OM1. EF as low as 40-45% in the past. Echo 4/22 showed EF 55-60% with grade 2 diastolic dysfunction.  Echo (4/24)  EF 50-55%.  Echo in 11/24 with EF 45-50%, normal RV, mild MR, anterolateral akinesis.  Chronically NYHA class III, worsened by body habitus and knee pain. She is volume overloaded by Cardiomoms and by exam. GDMT limited by cardio-renal syndrome. - Arrange for [employer] with weeping to lower extremities - Increase torsemide  to 100 mg bid, increase KCL to 40 bid. BMET/BNP today, repeat BMET in 10-14 days - Continue BiDil  1 tab tid - Continue Toprol  XL 25 mg daily.  - Off Jardiance  due to severe UTIs and uncontrolled A1C. 2. CKD IV:  Prev baseline ~1.5. Has been >2 since 11/2023 - in the setting of cardio-renal disease and poorly controlled T2DM. A1C 12.6. - Off spiro and ARB - Referral to [facility]; attempted to refer at last appointment.  3. Atrial fibrillation: Paroxysmal. Regular on exam.  - Continue Eliquis  5 mg bid. No abnormal bleeding - Continue amiodarone  200 mg daily. Follow LFTs and TSH. She will need regular eye exam while on amiodarone . 4. PAD: Claudication bilaterally. Not candidate for cilostazol with CHF.  She has had a left fem-pop bypass in 8/20. She underwent PV angiogram with balloon angioplasty of the R SFA 7/22. 8/22 dopplers still showed severe right SFA disease. Dopplers 2/23 showed patent right SFA.  Peripheral arterial dopplers in 2/23 showed 50-74% stenosis right SFA and popliteal.  S/p RLE fem-pop angioplasty and stenting in 9/24.  - Will see if [facility] can help with unna boots. - Continue rosuvastatin .  - She is on apixaban  so no ASA.  5. COPD/OSA:  Smoking 0.5pack/day. Continue bupropion . Attempting to quit.  - Discussed smoking cessation.    - Not able  to tolerate CPAP.   6. HTN: BP stable.  - Continue current BP-active meds. 7.Type 2 diabetes: Poorly controlled. A1C 12.6. On insulin .  - Now follows with Endocrine. - Needs opthomologist for eye exam 8. CAD/HLD: LHC in 12/15 with 80% stenosis in small OM1, nonobstructive disease in other territories. No chest pain. - Continue rosuvastatin  + Vascepa  - Not on ASA with need for Eliquis . 9. Carotid stenosis: Stable 4/24 dopplers. 10. Obesity: There is no height or weight on file to calculate BMI. - On Mounjaro  - has not had weight loss yet  Follow up in 4-6 weeks with APP  Harlene CHRISTELLA Gainer, FNP 05/20/2024

## 2024-05-20 NOTE — Telephone Encounter (Signed)
 Called to confirm/remind patient of their appointment at the Advanced [facility] Failure Clinic on 05/20/24.    Appointment:              [] Confirmed             [x] Left mess              [] No answer/No voice mail             [] Phone not in service   Patient reminded to bring all medications and/or complete list.   Confirmed patient has transportation. Gave directions, instructed to utilize valet parking.

## 2024-05-21 ENCOUNTER — Other Ambulatory Visit (HOSPITAL_COMMUNITY): Payer: Self-pay

## 2024-05-21 ENCOUNTER — Ambulatory Visit (HOSPITAL_COMMUNITY): Payer: Self-pay | Admitting: Family Medicine

## 2024-05-21 ENCOUNTER — Encounter (HOSPITAL_COMMUNITY): Payer: Self-pay

## 2024-05-21 ENCOUNTER — Ambulatory Visit (HOSPITAL_COMMUNITY)
Admission: RE | Admit: 2024-05-21 | Discharge: 2024-05-21 | Disposition: A | Discharge status: Home / Self Care | Source: Ambulatory Visit | Attending: Family Medicine | Admitting: Family Medicine

## 2024-05-21 VITALS — BP 138/60 | HR 65 | Ht 63.0 in | Wt 221.6 lb

## 2024-05-21 DIAGNOSIS — G4733 Obstructive sleep apnea (adult) (pediatric): Secondary | ICD-10-CM | POA: Diagnosis not present

## 2024-05-21 DIAGNOSIS — I6521 Occlusion and stenosis of right carotid artery: Secondary | ICD-10-CM | POA: Insufficient documentation

## 2024-05-21 DIAGNOSIS — I480 Paroxysmal atrial fibrillation: Secondary | ICD-10-CM | POA: Insufficient documentation

## 2024-05-21 DIAGNOSIS — I130 Hypertensive heart and chronic kidney disease with heart failure and stage 1 through stage 4 chronic kidney disease, or unspecified chronic kidney disease: Secondary | ICD-10-CM | POA: Diagnosis not present

## 2024-05-21 DIAGNOSIS — Z7901 Long term (current) use of anticoagulants: Secondary | ICD-10-CM | POA: Diagnosis not present

## 2024-05-21 DIAGNOSIS — M7989 Other specified soft tissue disorders: Secondary | ICD-10-CM | POA: Insufficient documentation

## 2024-05-21 DIAGNOSIS — Z7985 Long-term (current) use of injectable non-insulin antidiabetic drugs: Secondary | ICD-10-CM | POA: Diagnosis not present

## 2024-05-21 DIAGNOSIS — E669 Obesity, unspecified: Secondary | ICD-10-CM | POA: Diagnosis not present

## 2024-05-21 DIAGNOSIS — Z87440 Personal history of urinary (tract) infections: Secondary | ICD-10-CM | POA: Insufficient documentation

## 2024-05-21 DIAGNOSIS — J449 Chronic obstructive pulmonary disease, unspecified: Secondary | ICD-10-CM | POA: Diagnosis not present

## 2024-05-21 DIAGNOSIS — Z79899 Other long term (current) drug therapy: Secondary | ICD-10-CM | POA: Insufficient documentation

## 2024-05-21 DIAGNOSIS — I5022 Chronic systolic (congestive) heart failure: Secondary | ICD-10-CM | POA: Diagnosis present

## 2024-05-21 DIAGNOSIS — F17210 Nicotine dependence, cigarettes, uncomplicated: Secondary | ICD-10-CM | POA: Insufficient documentation

## 2024-05-21 DIAGNOSIS — I428 Other cardiomyopathies: Secondary | ICD-10-CM | POA: Diagnosis not present

## 2024-05-21 DIAGNOSIS — Z6839 Body mass index (BMI) 39.0-39.9, adult: Secondary | ICD-10-CM | POA: Insufficient documentation

## 2024-05-21 DIAGNOSIS — I739 Peripheral vascular disease, unspecified: Secondary | ICD-10-CM

## 2024-05-21 DIAGNOSIS — N184 Chronic kidney disease, stage 4 (severe): Secondary | ICD-10-CM | POA: Diagnosis not present

## 2024-05-21 DIAGNOSIS — I10 Essential (primary) hypertension: Secondary | ICD-10-CM

## 2024-05-21 DIAGNOSIS — E1122 Type 2 diabetes mellitus with diabetic chronic kidney disease: Secondary | ICD-10-CM | POA: Diagnosis not present

## 2024-05-21 DIAGNOSIS — E1151 Type 2 diabetes mellitus with diabetic peripheral angiopathy without gangrene: Secondary | ICD-10-CM | POA: Diagnosis not present

## 2024-05-21 DIAGNOSIS — E785 Hyperlipidemia, unspecified: Secondary | ICD-10-CM | POA: Diagnosis not present

## 2024-05-21 DIAGNOSIS — I2510 Atherosclerotic heart disease of native coronary artery without angina pectoris: Secondary | ICD-10-CM | POA: Insufficient documentation

## 2024-05-21 DIAGNOSIS — Z794 Long term (current) use of insulin: Secondary | ICD-10-CM

## 2024-05-21 LAB — BASIC METABOLIC PANEL WITH GFR
Anion gap: 15 (ref 5–15)
BUN: 79 mg/dL — ABNORMAL HIGH (ref 8–23)
CO2: 24 mmol/L (ref 22–32)
Calcium: 8.9 mg/dL (ref 8.9–10.3)
Chloride: 102 mmol/L (ref 98–111)
Creatinine, Ser: 2.64 mg/dL — ABNORMAL HIGH (ref 0.44–1.00)
GFR, Estimated: 20 mL/min — ABNORMAL LOW (ref >60)
Glucose, Bld: 118 mg/dL — ABNORMAL HIGH (ref 70–99)
Potassium: 4.1 mmol/L (ref 3.5–5.1)
Sodium: 141 mmol/L (ref 135–145)

## 2024-05-21 LAB — BRAIN NATRIURETIC PEPTIDE: B Natriuretic Peptide: 232.5 pg/mL — ABNORMAL HIGH (ref 0.0–100.0)

## 2024-05-21 MED ORDER — POTASSIUM CHLORIDE CRYS ER 20 MEQ PO TBCR
40.0000 meq | EXTENDED_RELEASE_TABLET | Freq: Two times a day (BID) | ORAL | 11 refills | Status: DC
Start: 2024-05-21 — End: 2024-07-17

## 2024-05-21 MED ORDER — ISOSORB DINITRATE-HYDRALAZINE 20-37.5 MG PO TABS
1.0000 | ORAL_TABLET | Freq: Three times a day (TID) | ORAL | 3 refills | Status: DC
Start: 2024-05-21 — End: 2024-10-04

## 2024-05-21 NOTE — Progress Notes (Signed)
 Paramedicine Encounter   Patient ID: [name] , female,   DOB: 09-13-1961,62 y.o.,  MRN: [id number]   Met patient in clinic today with provider.  Weight @ clinic-221 B/P-138/60 P-65 SP02-96  Med changes-increase bidil  to TID   Refills needed-  Potassium-she is short one tablet in the sat pm dose  She is stopping the gabapentin  due to it making her shake too bad.  Last clinic visit , she was referred to wound clinic for unna boots. But they called twice and she didn't answer so they closed the referral.   Cardiomems was 20 today, she's at goal   Referral to kidney doc-- [facility] Legs look better today.  Labs done today.   Meds verified and pill box refilled.  Labs done today and will f/u if any changes.    [name], EMT-Paramedic [phone] 05/21/2024

## 2024-05-21 NOTE — Patient Instructions (Addendum)
 Take Bidil  THREE TIMES DAILY - updated Rx sent. Labs today - will call you if abnormal. Referral re-sent to Wound Clinic for Unna boots. See contact information below. Referral sent to [facility] - they should reach out to you to schedule first appointment. If not - call them at their contact number below. Return to see Dr. Rolan in 3 months.  PLEASE CALL US  AT [phone] IN SEPTEMBER TO SCHEDULE THIS APPOINTMENT. Please call us  at [phone] if any questions or concerns prior to your next visit.    [facility]  [address] [place], [place] [address]

## 2024-05-22 ENCOUNTER — Telehealth (HOSPITAL_COMMUNITY): Payer: Self-pay

## 2024-05-22 ENCOUNTER — Encounter: Payer: Self-pay | Admitting: "Endocrinology

## 2024-05-22 ENCOUNTER — Other Ambulatory Visit (HOSPITAL_COMMUNITY): Payer: Self-pay

## 2024-05-22 ENCOUNTER — Ambulatory Visit (INDEPENDENT_AMBULATORY_CARE_PROVIDER_SITE_OTHER): Admitting: "Endocrinology

## 2024-05-22 VITALS — BP 102/70 | HR 65 | Ht 63.0 in | Wt 222.0 lb

## 2024-05-22 DIAGNOSIS — Z794 Long term (current) use of insulin: Secondary | ICD-10-CM | POA: Diagnosis not present

## 2024-05-22 DIAGNOSIS — E782 Mixed hyperlipidemia: Secondary | ICD-10-CM | POA: Diagnosis not present

## 2024-05-22 DIAGNOSIS — E1165 Type 2 diabetes mellitus with hyperglycemia: Secondary | ICD-10-CM

## 2024-05-22 DIAGNOSIS — Z7985 Long-term (current) use of injectable non-insulin antidiabetic drugs: Secondary | ICD-10-CM | POA: Diagnosis not present

## 2024-05-22 LAB — POCT GLYCOSYLATED HEMOGLOBIN (HGB A1C): Hemoglobin A1C: 6.9 % — AB (ref 4.0–5.6)

## 2024-05-22 MED ORDER — MOUNJARO 7.5 MG/0.5ML ~~LOC~~ SOAJ: 7.5000 mg | SUBCUTANEOUS | 1 refills | Status: DC

## 2024-05-22 MED ORDER — BAQSIMI ONE PACK 3 MG/DOSE NA POWD
1.0000 | NASAL | 3 refills | Status: AC | PRN
Start: 2024-05-22 — End: ?

## 2024-05-22 NOTE — Telephone Encounter (Signed)
 Referral printed and faxed to [facility]- with OV notes, labs, and demographics.

## 2024-05-22 NOTE — Patient Instructions (Addendum)
 Will recommend the following: Lantus  55 units at bedtime Humalog  28-32 units tid 15 min before meals  (Skip if sugar is less than 70, cut the dose in half if sugar is between 71-100) Humalog  correction scale: Use in addition to your meal time/short acting insulin  based on blood sugars as follows: 151 - 180: 1 unit 181 - 210: 2 units 211 - 240: 3 units 241 - 270: 4 units 271 - 300: 5 units 301 - 330: 6 units 331 - 360: 7 units 361 - 390: 8 units 391 - 420: 9 units  Mounjaro  7.5 mg weekly

## 2024-05-22 NOTE — Progress Notes (Signed)
 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, No results found for: GFR Hba1c goal less than 7, current Hba1c is  Lab Results  Component Value Date   HGBA1C 6.9 (A) 05/22/2024   Will recommend the following: Lantus  55 units at bedtime Humalog  30 units tid 15 min before meals  (Skip if sugar is less than 70, cut the dose in half if sugar is between 71-100) Humalog  correction scale: Use in addition to your meal time/short acting insulin  based on blood sugars as follows: 151 - 180: 1 unit 181 - 210: 2 units 211 - 240: 3 units 241 - 270: 4 units 271 - 300: 5 units 301 - 330: 6 units 331 - 360: 7 units 361 - 390: 8 units 391 - 420: 9 units  Mounjaro  7.5 mg weekly (max dose tolerated)  Has libre 01/25/24 Ordered DM education   No known contraindications/side effects to any of above medications Glucagon  discussed and prescribed with refills on 01/25/24   No history of MEN syndrome/medullary thyroid  cancer/pancreatitis or pancreatic cancer in self or family  -Last LD and Tg are as follows: Lab Results  Component Value Date   LDLCALC 79 02/29/2024    Lab Results  Component Value Date   TRIG 386 (H) 02/29/2024   -On rosuvastatin  40 mg every day, takes one a day fish oil  -Follow low fat diet and exercise   -Blood pressure goal <140/90 - Microalbumin/creatinine goal is < 30 -Last MA/Cr is as follows: Lab Results  Component  Value Date   MICROALBUR 9.0 01/23/2024   -on ACE/ARB losartan  25 mg every day  -diet changes including salt restriction -limit eating outside -counseled BP targets per standards of diabetes care -uncontrolled blood pressure can lead to retinopathy, nephropathy and cardiovascular and atherosclerotic heart disease  Reviewed and counseled on: -A1C target -Blood sugar targets -Complications of uncontrolled diabetes  -Checking blood sugar before meals and bedtime and bring log next visit -All medications with mechanism of action and side effects -Hypoglycemia management: rule of 15's, Glucagon  Emergency Kit and medical alert ID -low-carb low-fat plate-method diet -At least 20 minutes of physical activity per day -Annual dilated retinal eye exam and foot exam -compliance and follow up needs -follow up as scheduled or earlier if problem gets worse  Call if blood sugar is less than 70 or consistently above 250    Take a 15 gm snack of carbohydrate at bedtime before you go to sleep if your blood sugar is less than 100.    If you are going to fast after midnight for a test or procedure, ask your physician for instructions on how to reduce/decrease your insulin  dose.    Call if blood sugar is less than 70 or consistently above 250  -Treating a low sugar by rule of 15  (15 gms of sugar every 15 min until sugar is more than 70) If you feel your sugar is low, test your sugar to be sure If your sugar is low (less than  70), then take 15 grams of a fast acting Carbohydrate (3-4 glucose tablets or glucose gel or 4 ounces of juice or regular soda) Recheck your sugar 15 min after treating low to make sure it is more than 70 If sugar is still less than 70, treat again with 15 grams of carbohydrate          Don't drive the hour of hypoglycemia  If unconscious/unable to eat or drink by mouth, use glucagon  injection or nasal spray baqsimi  and call 911. Can repeat again in 15 min if still  unconscious.  No follow-ups on file.   I have reviewed current medications, nurse's notes, allergies, vital signs, past medical and surgical history, family medical history, and social history for this encounter. Counseled patient on symptoms, examination findings, lab findings, imaging results, treatment decisions and monitoring and prognosis. The patient understood the recommendations and agrees with the treatment plan. All questions regarding treatment plan were fully answered.  Karla Birmingham, MD  05/22/24    History of Present Illness [name] is a 62 y.o. year old female who presents for follow up for Type II diabetes mellitus.  [name] was first diagnosed around 2014.   Diabetes education +  Home diabetes regimen: Lantus  58 units at bedtime Humalog  30 units tid 30 min before meals  Mounjaro  7.5 mg/week   COMPLICATIONS -  MI/Stroke +  retinopathy +  neuropathy +  nephropathy  BLOOD SUGAR DATA CGM interpretation: At today's visit, we reviewed her CGM downloads. The full report is scanned in the media. Reviewing the CGM trends, BG are elevated in morning and evening with lows overnight/daytime.  Physical Exam  BP 102/70   Pulse 65   Ht 5' 3 (1.6 m)   Wt 222 lb (100.7 kg)   LMP  (LMP Unknown)   SpO2 98%   BMI 39.33 kg/m    Constitutional: well developed, well nourished Head: normocephalic, atraumatic Eyes: sclera anicteric, no redness Neck: supple Lungs: normal respiratory effort Neurology: alert and oriented Skin: dry, no appreciable rashes Musculoskeletal: no appreciable defects Psychiatric: normal mood and affect Diabetic Foot Exam - Simple   No data filed      Current Medications Patient's Medications  New Prescriptions   No medications on file  Previous Medications   ACETAMINOPHEN  (TYLENOL ) 500 MG TABLET    Take 1,000 mg by mouth every 6 (six) hours as needed for headache (pain).   ALBUTEROL  (VENTOLIN  HFA) 108 (90 BASE) MCG/ACT INHALER     USE 2 PUFFS BY MOUTH EVERY FOUR HOURS, AS NEEDED, FOR COUGHING/WHEEZING   ALLOPURINOL  (ZYLOPRIM ) 100 MG TABLET    Take 150 mg by mouth daily.   AMIODARONE  (PACERONE ) 200 MG TABLET    Take 0.5 tablets (100 mg total) by mouth daily.   APIXABAN  (ELIQUIS ) 5 MG TABS TABLET    Take 1 tablet (5 mg total) by mouth 2 (two) times daily.   BUDESONIDE -FORMOTEROL  (SYMBICORT ) 160-4.5 MCG/ACT INHALER    Inhale 2 puffs into the lungs 2 (two) times daily.   BUPROPION  (WELLBUTRIN  XL) 300 MG 24 HR TABLET    Take 300 mg by mouth every morning.   CHOLECALCIFEROL  (VITAMIN D3) 50 MCG (2000 UT) CAPSULE    Take 1 capsule by mouth every morning.   COLCHICINE  0.6 MG TABLET    Take 1.2 mg by mouth See admin instructions. Take 1.2 mg for flair up and an hour later take 0.6 mg if needed   CONTINUOUS GLUCOSE SENSOR (FREESTYLE LIBRE  3 PLUS SENSOR) MISC    as directed.   DOCUSATE SODIUM  (COLACE) 100 MG CAPSULE    Take 2 capsules (200 mg total) by mouth at bedtime.   ERYTHROMYCIN OPHTHALMIC OINTMENT    Place 1 Application into the right eye 2 (two) times daily.   FLUTICASONE  (FLONASE) 50 MCG/ACT NASAL SPRAY    Place 1 spray into both nostrils as needed for allergies or rhinitis.   FOLIC ACID  (FOLVITE ) 1 MG TABLET    Take 1 tablet (1 mg total) by mouth daily.   GABAPENTIN  (NEURONTIN ) 100 MG CAPSULE    Take 1 capsule (100 mg total) by mouth 3 (three) times daily.   INSULIN  LISPRO (HUMALOG ) 100 UNIT/ML KWIKPEN    Inject 15 Units into the skin 3 (three) times daily with meals. Plus sliding scale, max dose 70 units /day   ISOSORBIDE -HYDRALAZINE  (BIDIL ) 20-37.5 MG TABLET    Take 1 tablet by mouth 3 (three) times daily.   LANTUS  SOLOSTAR 100 UNIT/ML SOLOSTAR PEN    Inject 30 Units into the skin daily.   LEVOTHYROXINE  (SYNTHROID ) 50 MCG TABLET    TAKE 1 TABLET (50 MCG TOTAL) BY MOUTH DAILY BEFORE BREAKFAST (AM)   MENTHOL -CAMPHOR (ICY HOT PRO NO MESS EX)    Apply 1 Application topically 3 (three) times daily as needed (arms, neuropathy  lower extrimity, gout in hand.).   METOPROLOL  SUCCINATE (TOPROL -XL) 25 MG 24 HR TABLET    Take 1 tablet (25 mg total) by mouth daily.   MOUNJARO  7.5 MG/0.5ML PEN    INJECT 7.5 MG INTO THE SKIN ONCE A WEEK.   NYAMYC  POWDER    Apply 1 Application topically 2 (two) times daily.   NYSTATIN  CREAM (MYCOSTATIN )    Apply 1 Application topically 2 (two) times daily as needed for dry skin.   OMEGA-3 FATTY ACIDS (FISH OIL PO)    Take 1 tablet by mouth daily.   PANTOPRAZOLE  (PROTONIX ) 40 MG TABLET    TAKE 1 TABLET (40 MG TOTAL) BY MOUTH DAILY(AM)   POLYETHYLENE GLYCOL (MIRALAX  / GLYCOLAX ) 17 G PACKET    Take 17 g by mouth daily as needed for moderate constipation.   POTASSIUM CHLORIDE  SA (KLOR-CON  M) 20 MEQ TABLET    Take 2 tablets (40 mEq total) by mouth 2 (two) times daily.   ROSUVASTATIN  (CRESTOR ) 40 MG TABLET    Take 1 tablet (40 mg total) by mouth daily.   TORSEMIDE  (DEMADEX ) 20 MG TABLET    Take 4 tablets (80 mg total) by mouth 2 (two) times daily.   TRIAMCINOLONE  OINTMENT (KENALOG ) 0.1 %    Apply topically 2 (two) times daily.   VITAMIN E PO    Take 1 tablet by mouth daily.  Modified Medications   No medications on file  Discontinued Medications   TIRZEPATIDE  (MOUNJARO ) 10 MG/0.5ML PEN    Inject 10 mg into the skin once a week.    Allergies Allergies  Allergen Reactions   Neurontin  [Gabapentin ] Nausea And Vomiting and Other (See Comments)    POSSIBLE SHAKING   Lyrica  [Pregabalin ] Other (See Comments)    Shaking    Other     Neurontin      Past Medical History Past Medical History:  Diagnosis Date   Acute renal failure ([name])    Acute respiratory failure with hypoxia ([name]) 12/02/2019   Alcohol abuse    Alcoholic cirrhosis ([name])    Anemia    Anxiety    Arthritis    knees (11/26/2018)   B12  deficiency    CAD (coronary artery disease)    a. 11/10/2014 Cath: LM nl, LAD min irregs, D1 30 ost, D2 50d, LCX 25m, OM1 80 p/m (1.5 mm vessel), OM2 48m, RCA nondom 18m-->med rx.. Demand  ischemia in the setting of rapid a-fib.   Cardiomyopathy ([name])    Carotid artery disease ([name])    a. 01/2015 Carotid Angio: RICA 100, LICA 95p; b. 02/2015 s/p L CEA; c. 05/2019 Carotid U/S: RICA 100. RECA >50. LICA 1-39%.   Cellulitis    lower extremities   Cellulitis in diabetic foot ([name]) 07/08/2019   CHF (congestive heart failure) ([name])    Chronic combined systolic and diastolic CHF (congestive heart failure) ([name])    a. 10/2014 Echo: EF 40-45%; b. 10/2018 Echo: EF 45-50%, gr2 DD; c. 11/2019 Echo: EF 50%, mild LVH, gr2 DD (restrictive), antlat HK, Nl RV fxn. Mild BAE. RVSP 59.48mmHg.   CKD (chronic kidney disease), stage III ([name])    Cocaine abuse ([name])    COPD (chronic obstructive pulmonary disease) ([name])    Critical ischemia of foot ([name]) 06/07/2021   Critical limb ischemia     Critical lower limb ischemia ([name])    Demand ischemia ([name]) 10/29/2014   Depression    Diabetes mellitus without complication ([name])    Diabetic peripheral neuropathy ([name])    DVT (deep venous thrombosis) ([name])    Dyspnea    Elevated troponin    a. Chronic elevation.   Fall 05/05/2021   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[name])    Peripheral arterial disease/critical limb ischemia     GERD (gastroesophageal reflux disease)    Hyperlipemia    Hypertension    Hypokalemia    Hypomagnesemia    Hypothyroidism    Marijuana abuse    Narcotic abuse ([name])    Noncompliance    NSVT (nonsustained ventricular tachycardia) ([name])    Obesity    Osteomyelitis ([name]) 06/21/2019   PAF (paroxysmal atrial fibrillation) ([name])    Paroxysmal atrial tachycardia ([name])    Peripheral arterial disease ([name])    a. 01/2015 Angio/PTA: RSFA 99 (atherectomy/pta) - 1 vessel runoff via diff dzs peroneal; b. 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   Pneumonia    once or twice (11/26/2018)   Poorly controlled type 2 diabetes mellitus ([name])    Renal disorder    Renal insufficiency    a. 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   SIRS  (systemic inflammatory response syndrome) ([name]) 04/06/2017   Sleep apnea    couldn't handle wearing the mask (11/26/2018)   Symptomatic bradycardia    a. Avoid AV blocking agent per EP. Prev req temp wire in 2017.   Tobacco abuse    Wrist fracture, left, closed, initial encounter 01/29/2015    Past Surgical History Past Surgical History:  Procedure Laterality Date   ABDOMINAL AORTOGRAM N/A 06/26/2019   Procedure: ABDOMINAL AORTOGRAM;  Surgeon: Darron Deatrice LABOR, MD;  Location: [facility];  Service: Cardiovascular;  Laterality: N/A;   ABDOMINAL AORTOGRAM W/LOWER EXTREMITY N/A 06/07/2021   Procedure: ABDOMINAL AORTOGRAM W/LOWER EXTREMITY;  Surgeon: Court Dorn PARAS, MD;  Location: [facility];  Service: Cardiovascular;  Laterality: N/A;   ABDOMINAL AORTOGRAM W/LOWER EXTREMITY N/A 08/11/2023   Procedure: ABDOMINAL AORTOGRAM W/LOWER EXTREMITY;  Surgeon: Magda Debby SAILOR, MD;  Location: [facility];  Service: Cardiovascular;  Laterality: N/A;   AMPUTATION Right 06/14/2017   Procedure: Right foot transmetatarsal amputation;  Surgeon: Harden Jerona GAILS, MD;  Location: [facility] OR;  Service: Orthopedics;  Laterality: Right;   AMPUTATION Left 06/28/2019   Procedure: AMPUTATION LEFT FIFTH TOE;  Surgeon: Oris Krystal FALCON, MD;  Location: [facility] OR;  Service: Vascular;  Laterality: Left;   AMPUTATION TOE Right 04/28/2017   Procedure: AMPUTATION OF RIGHT SECOND RAY;  Surgeon: Harden Jerona GAILS, MD;  Location: [facility] OR;  Service: Orthopedics;  Laterality: Right;   CARDIAC CATHETERIZATION     CARDIAC CATHETERIZATION N/A 01/12/2016   Procedure: Temporary Wire;  Surgeon: Lynwood Schilling, MD;  Location: [facility];  Service: Cardiovascular;  Laterality: N/A;   CARDIOVERSION  ~ 02/2013   twice    CARDIOVERSION N/A 09/26/2023   Procedure: CARDIOVERSION (CATH LAB);  Surgeon: Alvan Ronal BRAVO, MD;  Location: [facility] INVASIVE CV LAB;  Service: Cardiovascular;  Laterality: N/A;   CAROTID ANGIOGRAM N/A 01/15/2015    Procedure: CAROTID ANGIOGRAM;  Surgeon: Dorn JINNY Lesches, MD;  Location: [facility] CATH LAB;  Service: Cardiovascular;  Laterality: N/A;   COLONOSCOPY N/A 03/14/2024   Procedure: COLONOSCOPY;  Surgeon: Albertus Gordy HERO, MD;  Location: [facility] ENDOSCOPY;  Service: Gastroenterology;  Laterality: N/A;   DILATION AND CURETTAGE OF UTERUS  1988   ENDARTERECTOMY Left 02/19/2015   Procedure: LEFT CAROTID ENDARTERECTOMY ;  Surgeon: Gaile LELON New, MD;  Location: [facility] OR;  Service: Vascular;  Laterality: Left;   ESOPHAGOGASTRODUODENOSCOPY N/A 03/13/2024   Procedure: EGD (ESOPHAGOGASTRODUODENOSCOPY);  Surgeon: Albertus Gordy HERO, MD;  Location: [facility] ENDOSCOPY;  Service: Gastroenterology;  Laterality: N/A;   FEMORAL-TIBIAL BYPASS GRAFT Left 06/28/2019   Procedure: BYPASS GRAFT LEFT LEG FEMORAL TO ANTERIOR TIBIAL ARTERY using LEFT GREATER SAPHENOUS VEIN;  Surgeon: Oris Krystal FALCON, MD;  Location: [facility] OR;  Service: Vascular;  Laterality: Left;   LEFT HEART CATHETERIZATION WITH CORONARY ANGIOGRAM N/A 10/31/2014   Procedure: LEFT HEART CATHETERIZATION WITH CORONARY ANGIOGRAM;  Surgeon: Lonni JONETTA Cash, MD;  Location: [facility] CATH LAB;  Service: Cardiovascular;  Laterality: N/A;   LOWER EXTREMITY ANGIOGRAM N/A 09/10/2013   Procedure: LOWER EXTREMITY ANGIOGRAM;  Surgeon: Dorn JINNY Lesches, MD;  Location: [facility] CATH LAB;  Service: Cardiovascular;  Laterality: N/A;   LOWER EXTREMITY ANGIOGRAM N/A 01/15/2015   Procedure: LOWER EXTREMITY ANGIOGRAM;  Surgeon: Dorn JINNY Lesches, MD;  Location: [facility] CATH LAB;  Service: Cardiovascular;  Laterality: N/A;   LOWER EXTREMITY ANGIOGRAPHY N/A 04/13/2017   Procedure: Lower Extremity Angiography;  Surgeon: Lesches Dorn JINNY, MD;  Location: [facility] INVASIVE CV LAB;  Service: Cardiovascular;  Laterality: N/A;   LOWER EXTREMITY ANGIOGRAPHY Left 06/26/2019   Procedure: LOWER EXTREMITY ANGIOGRAPHY;  Surgeon: Darron Deatrice LABOR, MD;  Location: [facility];  Service: Cardiovascular;  Laterality: Left;   PERIPHERAL VASCULAR  ATHERECTOMY Right 06/07/2021   Procedure: PERIPHERAL VASCULAR ATHERECTOMY;  Surgeon: Lesches Dorn JINNY, MD;  Location: [facility] INVASIVE CV LAB;  Service: Cardiovascular;  Laterality: Right;   PERIPHERAL VASCULAR BALLOON ANGIOPLASTY Left 06/26/2019   Procedure: PERIPHERAL VASCULAR BALLOON ANGIOPLASTY;  Surgeon: Darron Deatrice LABOR, MD;  Location: [facility];  Service: Cardiovascular;  Laterality: Left;  unable to cross lt sfa occlusion   PERIPHERAL VASCULAR INTERVENTION  04/13/2017   Procedure: Peripheral Vascular Intervention;  Surgeon: Lesches Dorn JINNY, MD;  Location: [facility] INVASIVE CV LAB;  Service: Cardiovascular;;   PERIPHERAL VASCULAR INTERVENTION  08/11/2023   Procedure: PERIPHERAL VASCULAR INTERVENTION;  Surgeon: Magda Debby SAILOR, MD;  Location: [facility];  Service: Cardiovascular;;   PRESSURE SENSOR/CARDIOMEMS N/A 02/05/2020   Procedure: PRESSURE SENSOR/CARDIOMEMS;  Surgeon: Rolan Ezra RAMAN, MD;  Location: [facility];  Service: Cardiovascular;  Laterality: N/A;   VEIN HARVEST Left 06/28/2019   Procedure: LEFT LEG GREATER SAPHENOUS VEIN HARVEST;  Surgeon: Oris Krystal FALCON, MD;  Location: [facility] OR;  Service: Vascular;  Laterality: Left;    Family History family history includes Breast cancer in her mother; Cancer in her mother; Clotting disorder in her mother; Diabetes in her mother; Emphysema in her sister; Heart attack in her mother; Heart disease in her father and mother; Hypertension in her father and mother.  Social History Social History   Socioeconomic History   Marital status: Single    Spouse name: Not on file   Number of children: 1   Years of education: 12   Highest education level: 12th grade  Occupational History   Occupation: disabled  Tobacco Use   Smoking status: Every Day    Current packs/day: 1.00    Average packs/day: 1 pack/day for 44.0 years (44.0 ttl pk-yrs)    Types: E-cigarettes, Cigarettes   Smokeless tobacco: Former    Types: Snuff  Vaping Use    Vaping status: Former   Devices: 11/26/2018 stopped months ago  Substance and Sexual Activity   Alcohol use: Not Currently    Comment: occ   Drug use: Yes    Types: Crack cocaine, Marijuana, Oxycodone    Sexual activity: Not Currently  Other Topics Concern   Not on file  Social History Narrative   ** Merged History Encounter **       Lives in [place], in motel with sister.  They are looking to move but don't have a place to go yet.     Social Drivers of Health   Financial Resource Strain: Low Risk  (02/09/2023)   Overall Financial Resource Strain (CARDIA)    Difficulty of Paying Living Expenses: Not hard at all  Food Insecurity: No Food Insecurity (03/09/2024)   Hunger Vital Sign    Worried About Running Out of Food in the Last Year: Never true    Ran Out of Food in the Last Year: Never true  Transportation Needs: Unmet Transportation Needs (03/09/2024)   PRAPARE - Transportation    Lack of Transportation (Medical): Yes    Lack of Transportation (Non-Medical): Yes  Physical Activity: Inactive (02/09/2023)   Exercise Vital Sign    Days of Exercise per Week: 0 days    Minutes of Exercise per Session: 0 min  Stress: No Stress Concern Present (02/09/2023)   [employer] of Occupational Health - Occupational Stress Questionnaire    Feeling of Stress : Not at all  Social Connections: Unknown (08/21/2023)   Received from [facility]   Social Network    Social Network: Not on file  Intimate Partner Violence: Not At Risk (03/09/2024)   Humiliation, Afraid, Rape, and Kick questionnaire    Fear of Current or Ex-Partner: No    Emotionally Abused: No    Physically Abused: No    Sexually Abused: No    Lab Results  Component Value Date   HGBA1C 6.9 (A) 05/22/2024   HGBA1C 12.6 (H) 01/23/2024   HGBA1C 12.0 (H) 09/25/2023   Lab Results  Component Value Date   CHOL 163 02/29/2024   Lab Results  Component Value Date   HDL 37 (L) 02/29/2024   Lab Results  Component Value  Date   LDLCALC 79 02/29/2024   Lab Results  Component Value Date   TRIG 386 (H) 02/29/2024   Lab Results  Component Value Date  CHOLHDL 4.4 02/29/2024   Lab Results  Component Value Date   CREATININE 2.64 (H) 05/21/2024   No results found for: GFR Lab Results  Component Value Date   MICROALBUR 9.0 01/23/2024      Component Value Date/Time   NA 141 05/21/2024 1517   NA 140 09/07/2022 1144   K 4.1 05/21/2024 1517   CL 102 05/21/2024 1517   CO2 24 05/21/2024 1517   GLUCOSE 118 (H) 05/21/2024 1517   BUN 79 (H) 05/21/2024 1517   BUN 26 (H) 09/07/2022 1144   CREATININE 2.64 (H) 05/21/2024 1517   CREATININE 2.01 (H) 01/23/2024 1444   CALCIUM  8.9 05/21/2024 1517   PROT 6.2 (L) 04/24/2024 1322   PROT 6.7 07/07/2022 1155   ALBUMIN  2.6 (L) 04/24/2024 1322   ALBUMIN  4.0 07/07/2022 1155   AST 14 (L) 04/24/2024 1322   ALT 16 04/24/2024 1322   ALKPHOS 96 04/24/2024 1322   BILITOT 0.5 04/24/2024 1322   BILITOT <0.2 07/07/2022 1155   GFRNONAA 20 (L) 05/21/2024 1517   GFRAA 39 (L) 06/08/2020 1130      Latest Ref Rng & Units 05/21/2024    3:17 PM 05/06/2024    2:19 PM 04/24/2024    1:22 PM  BMP  Glucose 70 - 99 mg/dL 881  788  782   BUN 8 - 23 mg/dL 79  62  56   Creatinine 0.44 - 1.00 mg/dL 7.35  7.54  7.87   Sodium 135 - 145 mmol/L 141  140  142   Potassium 3.5 - 5.1 mmol/L 4.1  5.0  4.4   Chloride 98 - 111 mmol/L 102  100  102   CO2 22 - 32 mmol/L 24  23  25    Calcium  8.9 - 10.3 mg/dL 8.9  9.1  8.5        Component Value Date/Time   WBC 7.9 04/24/2024 1322   RBC 2.78 (L) 04/24/2024 1322   HGB 8.0 (L) 04/24/2024 1322   HGB 10.5 (L) 05/25/2021 1444   HCT 27.4 (L) 04/24/2024 1322   HCT 35.5 05/25/2021 1444   PLT 268 04/24/2024 1322   PLT 368 05/25/2021 1444   MCV 98.6 04/24/2024 1322   MCV 88 05/25/2021 1444   MCH 28.8 04/24/2024 1322   MCHC 29.2 (L) 04/24/2024 1322   RDW 17.2 (H) 04/24/2024 1322   RDW 14.1 05/25/2021 1444   LYMPHSABS 1.1 04/24/2024 1322    LYMPHSABS 1.6 06/20/2019 1125   MONOABS 0.5 04/24/2024 1322   EOSABS 0.3 04/24/2024 1322   EOSABS 0.3 06/20/2019 1125   BASOSABS 0.0 04/24/2024 1322   BASOSABS 0.0 06/20/2019 1125     Parts of this note may have been dictated using voice recognition software. There may be variances in spelling and vocabulary which are unintentional. Not all errors are proofread. Please notify the dino if any discrepancies are noted or if the meaning of any statement is not clear.

## 2024-05-22 NOTE — Progress Notes (Signed)
 Paramedicine Encounter    Patient ID: [name], female    DOB: Apr 16, 1961, 63 y.o.   MRN: [id number]  Came out today for med rec post labs-  Hold torsemide  and potassium for 3 days and then decrease to K to 40meq daily-which she takes 20meq BID with each torsemide  dose and decrease torsemide  to 60meq BID.   So those adjustments were made to pill box.    Patient Care Team: Campbell Reynolds, NP as PCP - General Court Dorn PARAS, MD as PCP - Cardiology (Cardiology) Rolan Ezra GORMAN, MD as PCP - Advanced Heart Failure (Cardiology) Inocencio Soyla Lunger, MD as PCP - Electrophysiology (Cardiology) Court Dorn PARAS, MD as Consulting Physician (Cardiology) Darlean Ozell NOVAK, MD as Consulting Physician (Pulmonary Disease) System, Provider Not In  Patient Active Problem List   Diagnosis Date Noted   Persistent atrial fibrillation ([name]) 03/15/2024   Benign neoplasm of ascending colon 03/14/2024   Benign neoplasm of transverse colon 03/14/2024   Benign neoplasm of descending colon 03/14/2024   Benign neoplasm of sigmoid colon 03/14/2024   Chronic systolic congestive heart failure ([name]) 03/12/2024   Melena 03/12/2024   Heme positive stool 03/11/2024   GI bleed 03/08/2024   History of CAD (coronary artery disease) 03/08/2024   CKD (chronic kidney disease), stage IV ([name]) 03/08/2024   Continuous dependence on cigarette smoking 03/08/2024   GAD (generalized anxiety disorder) 03/08/2024   HFrEF (heart failure with reduced ejection fraction) ([name]) 10/11/2023   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) and diastolic (congestive) heart failure ([name]) 10/10/2023   Pre-ulcerative calluses 04/25/2022   PAF (paroxysmal atrial fibrillation) ([name]) 05/05/2021   COPD (chronic obstructive pulmonary disease) ([name]) 05/05/2021   OSA (obstructive sleep apnea) 05/05/2021   Stage 3b chronic kidney disease ([name]) 05/05/2021   Pressure injury of skin 05/04/2021   Diabetic nephropathy ([name]) 01/02/2021    Low back pain 06/01/2020   Hyperlipidemia 04/03/2020   Peripheral artery disease ([name]) 12/26/2019   NICM (nonischemic cardiomyopathy) ([name]) 06/20/2019   Non-healing ulcer ([name]) 06/20/2019   Coagulation disorder ([name]) 08/09/2017   Depression 07/21/2017   At risk for adverse drug reaction 06/20/2017   Peripheral neuropathy 06/20/2017   S/P transmetatarsal amputation of foot, right ([name]) 06/05/2017   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) 05/19/2017   Obesity, class 2 02/24/2016   Anticoagulation management encounter 02/10/2016   Chronic sinus bradycardia 01/12/2016   Essential hypertension 12/22/2015   Demand ischemia ([name])    Acute on chronic diastolic CHF (congestive heart failure) ([name])    Symptomatic anemia 11/08/2015   Hypokalemia 11/08/2015   Tobacco abuse 10/23/2015   Coronary artery disease    DOE (dyspnea on exertion) 04/29/2015   Paroxysmal atrial fibrillation ([name]) 01/16/2015   Carotid artery stenosis 01/16/2015   Insomnia 02/03/2014   S/P peripheral artery angioplasty - TurboHawk atherectomy; R SFA 09/11/2013    Class: Acute   Leg pain, bilateral 08/19/2013   Hypothyroidism 07/31/2013   History of cocaine abuse ([name]) 06/13/2013   Long term current use of anticoagulant therapy 05/20/2013   Alcohol abuse    Narcotic abuse ([name])    Marijuana abuse    Alcoholic cirrhosis ([name])    Insulin  dependent type 2 diabetes mellitus ([name])     Current Outpatient Medications:    acetaminophen  (TYLENOL ) 500 MG tablet, Take 1,000 mg by mouth every 6 (six) hours as needed for headache (pain)., Disp: , Rfl:    albuterol  (VENTOLIN  HFA) 108 (90  Base) MCG/ACT inhaler, USE 2 PUFFS BY MOUTH EVERY FOUR HOURS, AS NEEDED, FOR COUGHING/WHEEZING, Disp: 8.5 g, Rfl: 0   allopurinol  (ZYLOPRIM ) 100 MG tablet, Take 150 mg by mouth daily., Disp: , Rfl:    amiodarone  (PACERONE ) 200 MG tablet, Take 0.5 tablets (100 mg total) by mouth daily.,  Disp: 30 tablet, Rfl: 1   apixaban  (ELIQUIS ) 5 MG TABS tablet, Take 1 tablet (5 mg total) by mouth 2 (two) times daily., Disp: 60 tablet, Rfl: 6   budesonide -formoterol  (SYMBICORT ) 160-4.5 MCG/ACT inhaler, Inhale 2 puffs into the lungs 2 (two) times daily., Disp: 1 each, Rfl: 2   buPROPion  (WELLBUTRIN  XL) 300 MG 24 hr tablet, Take 300 mg by mouth every morning., Disp: , Rfl:    Cholecalciferol  (VITAMIN D3) 50 MCG (2000 UT) capsule, Take 1 capsule by mouth every morning., Disp: , Rfl:    colchicine  0.6 MG tablet, Take 1.2 mg by mouth See admin instructions. Take 1.2 mg for flair up and an hour later take 0.6 mg if needed, Disp: , Rfl:    Continuous Glucose Sensor (FREESTYLE LIBRE 3 PLUS SENSOR) MISC, as directed., Disp: , Rfl:    docusate sodium  (COLACE) 100 MG capsule, Take 2 capsules (200 mg total) by mouth at bedtime. (Patient not taking: Reported on 05/21/2024), Disp: 10 capsule, Rfl: 0   erythromycin ophthalmic ointment, Place 1 Application into the right eye 2 (two) times daily., Disp: , Rfl:    fluticasone  (FLONASE) 50 MCG/ACT nasal spray, Place 1 spray into both nostrils as needed for allergies or rhinitis., Disp: , Rfl:    folic acid  (FOLVITE ) 1 MG tablet, Take 1 tablet (1 mg total) by mouth daily., Disp: 30 tablet, Rfl: 0   gabapentin  (NEURONTIN ) 100 MG capsule, Take 1 capsule (100 mg total) by mouth 3 (three) times daily. (Patient not taking: Reported on 05/21/2024), Disp: 270 capsule, Rfl: 1   insulin  lispro (HUMALOG ) 100 UNIT/ML KwikPen, Inject 15 Units into the skin 3 (three) times daily with meals. Plus sliding scale, max dose 70 units /day, Disp: 25 mL, Rfl: 1   isosorbide -hydrALAZINE  (BIDIL ) 20-37.5 MG tablet, Take 1 tablet by mouth 3 (three) times daily., Disp: 270 tablet, Rfl: 3   LANTUS  SOLOSTAR 100 UNIT/ML Solostar Pen, Inject 30 Units into the skin daily., Disp: 15 mL, Rfl: 0   levothyroxine  (SYNTHROID ) 50 MCG tablet, TAKE 1 TABLET (50 MCG TOTAL) BY MOUTH DAILY BEFORE BREAKFAST (AM),  Disp: 30 tablet, Rfl: 0   Menthol -Camphor (ICY HOT PRO NO MESS EX), Apply 1 Application topically 3 (three) times daily as needed (arms, neuropathy lower extrimity, gout in hand.)., Disp: , Rfl:    metoprolol  succinate (TOPROL -XL) 25 MG 24 hr tablet, Take 1 tablet (25 mg total) by mouth daily., Disp: 90 tablet, Rfl: 3   MOUNJARO  7.5 MG/0.5ML Pen, INJECT 7.5 MG INTO THE SKIN ONCE A WEEK., Disp: 6 mL, Rfl: 0   NYAMYC  powder, Apply 1 Application topically 2 (two) times daily., Disp: , Rfl:    nystatin  cream (MYCOSTATIN ), Apply 1 Application topically 2 (two) times daily as needed for dry skin., Disp: , Rfl:    Omega-3 Fatty Acids (FISH OIL PO), Take 1 tablet by mouth daily., Disp: , Rfl:    pantoprazole  (PROTONIX ) 40 MG tablet, TAKE 1 TABLET (40 MG TOTAL) BY MOUTH DAILY(AM), Disp: 30 tablet, Rfl: 0   polyethylene glycol (MIRALAX  / GLYCOLAX ) 17 g packet, Take 17 g by mouth daily as needed for moderate constipation. (Patient not taking: Reported on 05/21/2024),  Disp: 14 each, Rfl: 0   potassium chloride  SA (KLOR-CON  M) 20 MEQ tablet, Take 2 tablets (40 mEq total) by mouth 2 (two) times daily., Disp: 120 tablet, Rfl: 11   rosuvastatin  (CRESTOR ) 40 MG tablet, Take 1 tablet (40 mg total) by mouth daily., Disp: 90 tablet, Rfl: 3   tirzepatide  (MOUNJARO ) 10 MG/0.5ML Pen, Inject 10 mg into the skin once a week., Disp: 2 mL, Rfl: 0   torsemide  (DEMADEX ) 20 MG tablet, Take 4 tablets (80 mg total) by mouth 2 (two) times daily., Disp: , Rfl:    triamcinolone  ointment (KENALOG ) 0.1 %, Apply topically 2 (two) times daily., Disp: 454 g, Rfl: 0   VITAMIN E PO, Take 1 tablet by mouth daily., Disp: , Rfl:  Allergies  Allergen Reactions   Neurontin  Dexter.Del ] Nausea And Vomiting and Other (See Comments)    POSSIBLE SHAKING   Lyrica  [Pregabalin ] Other (See Comments)    Shaking    Other     Neurontin        Social History   Socioeconomic History   Marital status: Single    Spouse name: Not on file   Number  of children: 1   Years of education: 12   Highest education level: 12th grade  Occupational History   Occupation: disabled  Tobacco Use   Smoking status: Every Day    Current packs/day: 1.00    Average packs/day: 1 pack/day for 44.0 years (44.0 ttl pk-yrs)    Types: E-cigarettes, Cigarettes   Smokeless tobacco: Former    Types: Snuff  Vaping Use   Vaping status: Former   Devices: 11/26/2018 stopped months ago  Substance and Sexual Activity   Alcohol use: Not Currently    Comment: occ   Drug use: Yes    Types: Crack cocaine, Marijuana, Oxycodone    Sexual activity: Not Currently  Other Topics Concern   Not on file  Social History Narrative   ** Merged History Encounter **       Lives in [place], in motel with sister.  They are looking to move but don't have a place to go yet.     Social Drivers of Corporate investment banker Strain: Low Risk  (02/09/2023)   Overall Financial Resource Strain (CARDIA)    Difficulty of Paying Living Expenses: Not hard at all  Food Insecurity: No Food Insecurity (03/09/2024)   Hunger Vital Sign    Worried About Running Out of Food in the Last Year: Never true    Ran Out of Food in the Last Year: Never true  Transportation Needs: Unmet Transportation Needs (03/09/2024)   PRAPARE - Transportation    Lack of Transportation (Medical): Yes    Lack of Transportation (Non-Medical): Yes  Physical Activity: Inactive (02/09/2023)   Exercise Vital Sign    Days of Exercise per Week: 0 days    Minutes of Exercise per Session: 0 min  Stress: No Stress Concern Present (02/09/2023)   [employer] of Occupational Health - Occupational Stress Questionnaire    Feeling of Stress : Not at all  Social Connections: Unknown (08/21/2023)   Received from [facility]   Social Network    Social Network: Not on file  Intimate Partner Violence: Not At Risk (03/09/2024)   Humiliation, Afraid, Rape, and Kick questionnaire    Fear of Current or Ex-Partner: No     Emotionally Abused: No    Physically Abused: No    Sexually Abused: No    Physical Exam  Future Appointments  Date Time Provider Department Center  05/22/2024  2:20 PM Dartha Ernst, MD [facility] None  05/24/2024 11:00 AM Johnston Delon CROME, PT [facility]  06/11/2024 10:40 AM [facility] MM 2 [facility]  06/17/2024  1:20 PM GI[facility] 2 GI[facility]315[facility] [facility]  07/30/2024  3:10 PM Aniceto Daphne CROME, NP [facility] H&V       [name], Paramedic [phone] [facility] Paramedic  05/22/24

## 2024-05-23 ENCOUNTER — Encounter: Payer: Self-pay | Admitting: "Endocrinology

## 2024-05-23 NOTE — Therapy (Signed)
 OUTPATIENT PHYSICAL THERAPY LOWER EXTREMITY EVALUATION   Patient Name: [name] MRN: [id number] DOB:04-10-1961, 63 y.o., female Today's Date: 05/24/2024  END OF SESSION:  PT End of Session - 05/24/24 1108     Visit Number 1    Number of Visits 12    Date for PT Re-Evaluation 07/05/24    Authorization Type [employer] Dual Complete    PT Start Time 1145    PT Stop Time 1225    PT Time Calculation (min) 40 min    Activity Tolerance Patient tolerated treatment well    Behavior During Therapy WFL for tasks assessed/performed          Past Medical History:  Diagnosis Date   Acute renal failure ([name])    Acute respiratory failure with hypoxia ([name]) 12/02/2019   Alcohol abuse    Alcoholic cirrhosis ([name])    Anemia    Anxiety    Arthritis    knees (11/26/2018)   B12 deficiency    CAD (coronary artery disease)    a. 11/10/2014 Cath: LM nl, LAD min irregs, D1 30 ost, D2 50d, LCX 72m, OM1 80 p/m (1.5 mm vessel), OM2 31m, RCA nondom 75m-->med rx.. Demand ischemia in the setting of rapid a-fib.   Cardiomyopathy ([name])    Carotid artery disease ([name])    a. 01/2015 Carotid Angio: RICA 100, LICA 95p; b. 02/2015 s/p L CEA; c. 05/2019 Carotid U/S: RICA 100. RECA >50. LICA 1-39%.   Cellulitis    lower extremities   Cellulitis in diabetic foot ([name]) 07/08/2019   CHF (congestive heart failure) ([name])    Chronic combined systolic and diastolic CHF (congestive heart failure) ([name])    a. 10/2014 Echo: EF 40-45%; b. 10/2018 Echo: EF 45-50%, gr2 DD; c. 11/2019 Echo: EF 50%, mild LVH, gr2 DD (restrictive), antlat HK, Nl RV fxn. Mild BAE. RVSP 59.60mmHg.   CKD (chronic kidney disease), stage III ([name])    Cocaine abuse ([name])    COPD (chronic obstructive pulmonary disease) ([name])    Critical ischemia of foot ([name]) 06/07/2021   Critical limb ischemia     Critical lower limb ischemia ([name])    Demand ischemia ([name]) 10/29/2014   Depression    Diabetes mellitus without complication ([name])    Diabetic  peripheral neuropathy ([name])    DVT (deep venous thrombosis) ([name])    Dyspnea    Elevated troponin    a. Chronic elevation.   Fall 05/05/2021   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[name])    Peripheral arterial disease/critical limb ischemia     GERD (gastroesophageal reflux disease)    Hyperlipemia    Hypertension    Hypokalemia    Hypomagnesemia    Hypothyroidism    Marijuana abuse    Narcotic abuse ([name])    Noncompliance    NSVT (nonsustained ventricular tachycardia) ([name])    Obesity    Osteomyelitis ([name]) 06/21/2019   PAF (paroxysmal atrial fibrillation) ([name])    Paroxysmal atrial tachycardia ([name])    Peripheral arterial disease ([name])    a. 01/2015 Angio/PTA: RSFA 99 (atherectomy/pta) - 1 vessel runoff via diff dzs peroneal; b. 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   Pneumonia    once or twice (11/26/2018)   Poorly controlled type 2 diabetes mellitus ([name])    Renal disorder    Renal insufficiency    a. 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   SIRS (systemic inflammatory response syndrome) ([name]) 04/06/2017   Sleep apnea  couldn't handle wearing the mask (11/26/2018)   Symptomatic bradycardia    a. Avoid AV blocking agent per EP. Prev req temp wire in 2017.   Tobacco abuse    Wrist fracture, left, closed, initial encounter 01/29/2015   Past Surgical History:  Procedure Laterality Date   ABDOMINAL AORTOGRAM N/A 06/26/2019   Procedure: ABDOMINAL AORTOGRAM;  Surgeon: Darron Deatrice LABOR, MD;  Location: [facility];  Service: Cardiovascular;  Laterality: N/A;   ABDOMINAL AORTOGRAM W/LOWER EXTREMITY N/A 06/07/2021   Procedure: ABDOMINAL AORTOGRAM W/LOWER EXTREMITY;  Surgeon: Court Dorn PARAS, MD;  Location: [facility];  Service: Cardiovascular;  Laterality: N/A;   ABDOMINAL AORTOGRAM W/LOWER EXTREMITY N/A 08/11/2023   Procedure: ABDOMINAL AORTOGRAM W/LOWER EXTREMITY;  Surgeon: Magda Debby SAILOR, MD;  Location: [facility];  Service: Cardiovascular;   Laterality: N/A;   AMPUTATION Right 06/14/2017   Procedure: Right foot transmetatarsal amputation;  Surgeon: Harden Jerona GAILS, MD;  Location: [facility] OR;  Service: Orthopedics;  Laterality: Right;   AMPUTATION Left 06/28/2019   Procedure: AMPUTATION LEFT FIFTH TOE;  Surgeon: Oris Krystal FALCON, MD;  Location: [facility] OR;  Service: Vascular;  Laterality: Left;   AMPUTATION TOE Right 04/28/2017   Procedure: AMPUTATION OF RIGHT SECOND RAY;  Surgeon: Harden Jerona GAILS, MD;  Location: [facility] OR;  Service: Orthopedics;  Laterality: Right;   CARDIAC CATHETERIZATION     CARDIAC CATHETERIZATION N/A 01/12/2016   Procedure: Temporary Wire;  Surgeon: Lynwood Schilling, MD;  Location: [facility];  Service: Cardiovascular;  Laterality: N/A;   CARDIOVERSION  ~ 02/2013   twice    CARDIOVERSION N/A 09/26/2023   Procedure: CARDIOVERSION (CATH LAB);  Surgeon: Alvan Ronal BRAVO, MD;  Location: [facility] INVASIVE CV LAB;  Service: Cardiovascular;  Laterality: N/A;   CAROTID ANGIOGRAM N/A 01/15/2015   Procedure: CAROTID ANGIOGRAM;  Surgeon: Dorn PARAS Court, MD;  Location: [facility] CATH LAB;  Service: Cardiovascular;  Laterality: N/A;   COLONOSCOPY N/A 03/14/2024   Procedure: COLONOSCOPY;  Surgeon: Albertus Gordy HERO, MD;  Location: [facility] ENDOSCOPY;  Service: Gastroenterology;  Laterality: N/A;   DILATION AND CURETTAGE OF UTERUS  1988   ENDARTERECTOMY Left 02/19/2015   Procedure: LEFT CAROTID ENDARTERECTOMY ;  Surgeon: Gaile LELON New, MD;  Location: [facility] OR;  Service: Vascular;  Laterality: Left;   ESOPHAGOGASTRODUODENOSCOPY N/A 03/13/2024   Procedure: EGD (ESOPHAGOGASTRODUODENOSCOPY);  Surgeon: Albertus Gordy HERO, MD;  Location: [facility] ENDOSCOPY;  Service: Gastroenterology;  Laterality: N/A;   FEMORAL-TIBIAL BYPASS GRAFT Left 06/28/2019   Procedure: BYPASS GRAFT LEFT LEG FEMORAL TO ANTERIOR TIBIAL ARTERY using LEFT GREATER SAPHENOUS VEIN;  Surgeon: Oris Krystal FALCON, MD;  Location: [facility] OR;  Service: Vascular;  Laterality: Left;   LEFT HEART CATHETERIZATION WITH CORONARY ANGIOGRAM  N/A 10/31/2014   Procedure: LEFT HEART CATHETERIZATION WITH CORONARY ANGIOGRAM;  Surgeon: Lonni JONETTA Cash, MD;  Location: [facility] CATH LAB;  Service: Cardiovascular;  Laterality: N/A;   LOWER EXTREMITY ANGIOGRAM N/A 09/10/2013   Procedure: LOWER EXTREMITY ANGIOGRAM;  Surgeon: Dorn PARAS Court, MD;  Location: [facility] CATH LAB;  Service: Cardiovascular;  Laterality: N/A;   LOWER EXTREMITY ANGIOGRAM N/A 01/15/2015   Procedure: LOWER EXTREMITY ANGIOGRAM;  Surgeon: Dorn PARAS Court, MD;  Location: [facility] CATH LAB;  Service: Cardiovascular;  Laterality: N/A;   LOWER EXTREMITY ANGIOGRAPHY N/A 04/13/2017   Procedure: Lower Extremity Angiography;  Surgeon: Court Dorn PARAS, MD;  Location: [facility] INVASIVE CV LAB;  Service: Cardiovascular;  Laterality: N/A;   LOWER EXTREMITY ANGIOGRAPHY Left 06/26/2019   Procedure: LOWER EXTREMITY  ANGIOGRAPHY;  Surgeon: Darron Deatrice LABOR, MD;  Location: [facility] INVASIVE CV LAB;  Service: Cardiovascular;  Laterality: Left;   PERIPHERAL VASCULAR ATHERECTOMY Right 06/07/2021   Procedure: PERIPHERAL VASCULAR ATHERECTOMY;  Surgeon: Court Dorn PARAS, MD;  Location: [facility] INVASIVE CV LAB;  Service: Cardiovascular;  Laterality: Right;   PERIPHERAL VASCULAR BALLOON ANGIOPLASTY Left 06/26/2019   Procedure: PERIPHERAL VASCULAR BALLOON ANGIOPLASTY;  Surgeon: Darron Deatrice LABOR, MD;  Location: [facility];  Service: Cardiovascular;  Laterality: Left;  unable to cross lt sfa occlusion   PERIPHERAL VASCULAR INTERVENTION  04/13/2017   Procedure: Peripheral Vascular Intervention;  Surgeon: Court Dorn PARAS, MD;  Location: [facility] INVASIVE CV LAB;  Service: Cardiovascular;;   PERIPHERAL VASCULAR INTERVENTION  08/11/2023   Procedure: PERIPHERAL VASCULAR INTERVENTION;  Surgeon: Magda Debby SAILOR, MD;  Location: [facility];  Service: Cardiovascular;;   PRESSURE SENSOR/CARDIOMEMS N/A 02/05/2020   Procedure: PRESSURE SENSOR/CARDIOMEMS;  Surgeon: Rolan Ezra RAMAN, MD;  Location: [facility] INVASIVE CV LAB;  Service:  Cardiovascular;  Laterality: N/A;   VEIN HARVEST Left 06/28/2019   Procedure: LEFT LEG GREATER SAPHENOUS VEIN HARVEST;  Surgeon: Oris Krystal FALCON, MD;  Location: [facility] OR;  Service: Vascular;  Laterality: Left;   Patient Active Problem List   Diagnosis Date Noted   Persistent atrial fibrillation ([name]) 03/15/2024   Benign neoplasm of ascending colon 03/14/2024   Benign neoplasm of transverse colon 03/14/2024   Benign neoplasm of descending colon 03/14/2024   Benign neoplasm of sigmoid colon 03/14/2024   Chronic systolic congestive heart failure ([name]) 03/12/2024   Melena 03/12/2024   Heme positive stool 03/11/2024   GI bleed 03/08/2024   History of CAD (coronary artery disease) 03/08/2024   CKD (chronic kidney disease), stage IV ([name]) 03/08/2024   Continuous dependence on cigarette smoking 03/08/2024   GAD (generalized anxiety disorder) 03/08/2024   HFrEF (heart failure with reduced ejection fraction) ([name]) 10/11/2023   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) and diastolic (congestive) heart failure ([name]) 10/10/2023   Pre-ulcerative calluses 04/25/2022   PAF (paroxysmal atrial fibrillation) ([name]) 05/05/2021   COPD (chronic obstructive pulmonary disease) ([name]) 05/05/2021   OSA (obstructive sleep apnea) 05/05/2021   Stage 3b chronic kidney disease ([name]) 05/05/2021   Pressure injury of skin 05/04/2021   Diabetic nephropathy ([name]) 01/02/2021   Low back pain 06/01/2020   Hyperlipidemia 04/03/2020   Peripheral artery disease ([name]) 12/26/2019   NICM (nonischemic cardiomyopathy) ([name]) 06/20/2019   Non-healing ulcer ([name]) 06/20/2019   Coagulation disorder ([name]) 08/09/2017   Depression 07/21/2017   At risk for adverse drug reaction 06/20/2017   Peripheral neuropathy 06/20/2017   S/P transmetatarsal amputation of foot, right ([name]) 06/05/2017   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) 05/19/2017   Obesity, class 2 02/24/2016    Anticoagulation management encounter 02/10/2016   Chronic sinus bradycardia 01/12/2016   Essential hypertension 12/22/2015   Demand ischemia ([name])    Acute on chronic diastolic CHF (congestive heart failure) ([name])    Symptomatic anemia 11/08/2015   Hypokalemia 11/08/2015   Tobacco abuse 10/23/2015   Coronary artery disease    DOE (dyspnea on exertion) 04/29/2015   Paroxysmal atrial fibrillation ([name]) 01/16/2015   Carotid artery stenosis 01/16/2015   Insomnia 02/03/2014   S/P peripheral artery angioplasty - [place]bo[place] atherectomy; R SFA 09/11/2013    Class: Acute   Leg pain, bilateral 08/19/2013   Hypothyroidism 07/31/2013   History of cocaine abuse ([name]) 06/13/2013   Long term current use of  anticoagulant therapy 05/20/2013   Alcohol abuse    Narcotic abuse ([name])    Marijuana abuse    Alcoholic cirrhosis ([name])    Insulin  dependent type 2 diabetes mellitus ([name])     PCP: Campbell Reynolds NP  REFERRING PROVIDER: Magda Ned MD  REFERRING DIAG: weakness and deconditioning  THERAPY DIAG:  Difficulty in walking, not elsewhere classified  Decreased functional mobility and endurance  Rationale for Evaluation and Treatment: Rehabilitation  ONSET DATE: chronic   SUBJECTIVE:   SUBJECTIVE STATEMENT: Pt here to work on general mobility following vascular surgery.  Today is the first day she is using her new walker (Rollator).  She needs to go back to the foot doctor for new shoe.  She is difficulty walking due to recent surgeries and deconditioning.  She fell last year because her foot could not there was delayed healing .   2024 she had Stent Rt LE , Bypass LLE.     She does not do much walking. Walks to [place] and the picnic table and back.   PERTINENT HISTORY: Neuropathy severe peripheral arterial disease  64 old female with a history of CHF, diabetes, CAD, LE swelling , Rt and Lt. Foot amputations    PAIN:  Are you having pain? Yes: NPRS scale: 7 Pain location: Rt knee ,  posterior  Pain description: Sore  Aggravating factors: walking  Relieving factors: sitting   PRECAUTIONS: Fallweakness, Rt knee   RED FLAGS: None   WEIGHT BEARING RESTRICTIONS: No  FALLS:  Has patient fallen in last 6 months? Yes. Number of falls 3  LIVING ENVIRONMENT: Lives with: lives with their family Lives in: House/apartment Stairs: No Has following equipment at home: Walker - 4 wheeledcane, shower lift   OCCUPATION: not working  PLOF: Independent with basic ADLs, Requires assistive device for independence, Needs assistance with homemaking, and Leisure: mostly sedentary , games on her phone   PATIENT GOALS: I want to be able to walk more   NEXT MD VISIT: as needed   OBJECTIVE:  Note: Objective measures were completed at Evaluation unless otherwise noted.  DIAGNOSTIC FINDINGS: none   PATIENT SURVEYS:  FABQ: Fear-Avoidance Beliefs Questionnaire (FABQ)  Date tested 05/24/24  My pain was caused by physical activity   6   Completely Agree  2. Physical activity makes my pain worse 6   Completely Agree  3. Physical activity might harm my back 0    Completely disagree  4. I should not do physical activities which (might) make my pain worse 6   Completely Agree  5. I cannot do physical activities which (might) make my pain worse 5  6. My pain was caused by work or by an accident at work 0    Completely disagree  7. My work aggravated my pain 0    Completely disagree  8. I have a claim for compensation for my pain 0    Completely disagree  9. My work is too heavy for me 4  10. My work makes or would make my pain worse 4  11. My work might harm my back 4  12. I should not do my normal work with my present pain 6   Completely Agree  13. I cannot do my normal work with my present pain 6   Completely Agree  14. I cannot do my normal work until my pain is treated 0    Completely disagree  15. I do not think that I will be back to my  normal work within 3 months 0     Completely disagree  16. I do not think that I will ever be able to go back to that work 5  Total score  52   Scoring Scale 1: fear-avoidance beliefs about work - items 6,7,9,10,11,12,15 or 16. Add responses, divide by  Scale 2: fear-avoidance beliefs about physical activity - items 2,3,4,5   COGNITION: Overall cognitive status: Within functional limits for tasks assessed     SENSATION: neuropathy  EDEMA:  Swelling present in bilateral lower extremities not triggered   POSTURE: rounded shoulders, forward head, and increased thoracic kyphosis  LOWER EXTREMITY ROM:WFL  LOWER EXTREMITY MMT:  MMT Right eval Left eval  Hip flexion 5 5  Hip extension    Hip abduction    Hip adduction    Hip internal rotation    Hip external rotation    Knee flexion 5 4+   Knee extension 5 5  Ankle dorsiflexion    Ankle plantarflexion    Ankle inversion    Ankle eversion     (Blank rows = not tested)   FUNCTIONAL TESTS:  5 times sit to stand: 15 sec   GAIT: Distance walked: 161 Assistive device utilized: Environmental consultant - 4 wheeled Level of assistance: SBA Comments: needs 1 standing rest break O2 saturation 98% heart rate 96                                                                                                                                TREATMENT DATE:  [facility] Adult PT Treatment:                                                DATE: 05/24/24 TTherapeutic Activity: Sit to stand using rollator walker standing  alternating march Standing hip abduction  Long arc quads 2-minute walk test  Self Care: Plan of care ,expectations, home exercise program, endurance, and attendance policy   PATIENT EDUCATION:  Education details: See above Person educated: Patient Education method: Programmer, multimedia, Facilities manager, Verbal cues, and Handouts Education comprehension: verbalized understanding, returned demonstration, and needs further education  HOME EXERCISE PROGRAM: Access Code:  [email or web address] URL: [email or web address] Date: 05/24/2024 Prepared by: Delon Norma  Exercises - Sit to Stand with Arm Reach Toward Target  - 1-3 x daily - 7 x weekly - 2-3 sets - 10 reps - 5 hold - Seated Long Arc Quad  - 1-3 x daily - 7 x weekly - 2 sets - 10 reps - 5 hold - Standing March with Counter Support  - 1-3 x daily - 7 x weekly - 2 sets - 10 reps  ASSESSMENT:  CLINICAL IMPRESSION: Patient is a 62y.o. female who was seen today for physical therapy evaluation and treatment for deconditioning and difficulty walking due to previous vascular surgery in 2024.  Since that time the patient has not been able to walk as she was previously.  She is limited by options for foot where her right midfoot amputation poses a fall risk due to improper shoe she plans to get new shoes very soon.  Vital signs were stable during her walk but she does tire quickly.  Patient was given a basic home exercise program and encouraged to take multiple breaks throughout the day from sitting in her recliner to stand do some light walking and improve circulation to her lower body.  OBJECTIVE IMPAIRMENTS: Abnormal gait, cardiopulmonary status limiting activity, decreased activity tolerance, decreased balance, decreased cognition, decreased mobility, difficulty walking, decreased strength, increased edema, increased fascial restrictions, obesity, and pain.   ACTIVITY LIMITATIONS: carrying, lifting, standing, sleeping, stairs, transfers, and locomotion level  PARTICIPATION LIMITATIONS: meal prep, cleaning, laundry, interpersonal relationship, driving, shopping, and community activity  PERSONAL FACTORS: Social background, Time since onset of injury/illness/exacerbation, and 3+ comorbidities: High risk for major lower extremity amputation, congestive heart failure, diabetes, smoking are also affecting patient's functional outcome.   REHAB POTENTIAL: Fair see above comorbidities  CLINICAL DECISION MAKING:  Stable/uncomplicated  EVALUATION COMPLEXITY: Low   GOALS: Goals reviewed with patient? Yes  LONG TERM GOALS: Target date: 07/19/2024   Patient will be able to improve 2 min walk test distance by 50 feet or more with LRAD and no increase in pain.   Baseline: 161 with 1 standing rest break  Goal status: INITIAL  2.  Patient will be independent with final HEP upon discharge from PT and report consistent benefit following exercise completion.    Baseline:  Goal status: INITIAL  3.  Pt will be able to demonstrate good standing balance as evidenced by balance recovery from min dynamic balance challenges.   Baseline:  Goal status: INITIAL  4.  Patient will tolerate 15 minutes of cardio to work toward improved activity tolerance  Baseline:  Goal status: INITIAL  5.  Patient will be able to negotiate obstacles with her walker including curbs and inclines/declines with safety.  Baseline:  Goal status: INITIAL  PLAN: Feel like that PT FREQUENCY: 2x/week  PT DURATION: 6 weeks  PLANNED INTERVENTIONS: 97164- PT Re-evaluation, 97750- Physical Performance Testing, 97110-Therapeutic exercises, 97530- Therapeutic activity, 97112- Neuromuscular re-education, 97535- Self Care, [id number]- Manual therapy, [phone]- Gait training, Patient/Family education, Balance training, and DME instructions  PLAN FOR NEXT SESSION: NuStep, review home exercise program get into semireclined position consider adding straight leg raises bridges and outer hip strength balance as tolerated encouraged patient to go to the doctor and get new shoe   Jelisa [place], PT 05/24/2024, 5:29 PM   Delon Norma, PT 05/24/24 5:46 PM Phone: [phone] Fax: [phone]

## 2024-05-24 ENCOUNTER — Ambulatory Visit: Attending: Student | Admitting: Physical Therapy

## 2024-05-24 DIAGNOSIS — Z7409 Other reduced mobility: Secondary | ICD-10-CM | POA: Insufficient documentation

## 2024-05-24 DIAGNOSIS — R262 Difficulty in walking, not elsewhere classified: Secondary | ICD-10-CM | POA: Diagnosis present

## 2024-05-28 ENCOUNTER — Other Ambulatory Visit (HOSPITAL_COMMUNITY): Payer: Self-pay

## 2024-05-28 NOTE — Progress Notes (Signed)
 Paramedicine Encounter    Patient ID: [name], female    DOB: 19-Oct-1961, 63 y.o.   MRN: [id number]   Complaints-none   Edema-per usual   Compliance with meds-yes  Pill box filled-yes  If so, by whom-paramedic   Refills needed-amio, folic acid -requested these from pharm that they be delivered   Pt reports she is feeling pretty good. She denies increased sob, no dizziness, no c/p.  Her weight is up but she had to decrease her torsemide  due to abnormal labs last week per provider.  She does not feel bloated.  Does not appear to be bloated.  She has not taken her am dose of meds yet today. That was given to her now.  Appetite ok. No bleeding issues.  She does report some constipation, I reminded her to take the miralax  as needed.   She reports not heard from kidney doc yet, I will wait until next week to see if she has heard anything and I will call to ensure they have referral. She may have very well missed the phone call and message, I called her several times today before she called me back and that's b/c she has my number saved on her phone.   Meds verified and pill box refilled.  Minus the folic acid .    CBG PTA- 138  BP (!) 146/62   Pulse 69   Resp 18   Wt 220 lb (99.8 kg)   LMP  (LMP Unknown)   SpO2 98%   BMI 38.97 kg/m  Weight yesterday-? Last visit weight-221 @ clinic   Patient Care Team: Campbell Reynolds, NP as PCP - General Court Dorn PARAS, MD as PCP - Cardiology (Cardiology) Rolan Ezra GORMAN, MD as PCP - Advanced Heart Failure (Cardiology) Inocencio Soyla Lunger, MD as PCP - Electrophysiology (Cardiology) Court Dorn PARAS, MD as Consulting Physician (Cardiology) Darlean Ozell NOVAK, MD as Consulting Physician (Pulmonary Disease) System, Provider Not In  Patient Active Problem List   Diagnosis Date Noted   Persistent atrial fibrillation ([name]) 03/15/2024   Benign neoplasm of ascending colon 03/14/2024   Benign neoplasm of transverse colon 03/14/2024    Benign neoplasm of descending colon 03/14/2024   Benign neoplasm of sigmoid colon 03/14/2024   Chronic systolic congestive heart failure ([name]) 03/12/2024   Melena 03/12/2024   Heme positive stool 03/11/2024   GI bleed 03/08/2024   History of CAD (coronary artery disease) 03/08/2024   CKD (chronic kidney disease), stage IV ([name]) 03/08/2024   Continuous dependence on cigarette smoking 03/08/2024   GAD (generalized anxiety disorder) 03/08/2024   HFrEF (heart failure with reduced ejection fraction) ([name]) 10/11/2023   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) and diastolic (congestive) heart failure ([name]) 10/10/2023   Pre-ulcerative calluses 04/25/2022   PAF (paroxysmal atrial fibrillation) ([name]) 05/05/2021   COPD (chronic obstructive pulmonary disease) ([name]) 05/05/2021   OSA (obstructive sleep apnea) 05/05/2021   Stage 3b chronic kidney disease ([name]) 05/05/2021   Pressure injury of skin 05/04/2021   Diabetic nephropathy ([name]) 01/02/2021   Low back pain 06/01/2020   Hyperlipidemia 04/03/2020   Peripheral artery disease ([name]) 12/26/2019   NICM (nonischemic cardiomyopathy) ([name]) 06/20/2019   Non-healing ulcer ([name]) 06/20/2019   Coagulation disorder ([name]) 08/09/2017   Depression 07/21/2017   At risk for adverse drug reaction 06/20/2017   Peripheral neuropathy 06/20/2017   S/P transmetatarsal amputation of foot, right ([name]) 06/05/2017   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) 05/19/2017  Obesity, class 2 02/24/2016   Anticoagulation management encounter 02/10/2016   Chronic sinus bradycardia 01/12/2016   Essential hypertension 12/22/2015   Demand ischemia ([name])    Acute on chronic diastolic CHF (congestive heart failure) ([name])    Symptomatic anemia 11/08/2015   Hypokalemia 11/08/2015   Tobacco abuse 10/23/2015   Coronary artery disease    DOE (dyspnea on exertion) 04/29/2015   Paroxysmal atrial fibrillation ([name])  01/16/2015   Carotid artery stenosis 01/16/2015   Insomnia 02/03/2014   S/P peripheral artery angioplasty - TurboHawk atherectomy; R SFA 09/11/2013    Class: Acute   Leg pain, bilateral 08/19/2013   Hypothyroidism 07/31/2013   History of cocaine abuse ([name]) 06/13/2013   Long term current use of anticoagulant therapy 05/20/2013   Alcohol abuse    Narcotic abuse ([name])    Marijuana abuse    Alcoholic cirrhosis ([name])    Insulin  dependent type 2 diabetes mellitus ([name])     Current Outpatient Medications:    acetaminophen  (TYLENOL ) 500 MG tablet, Take 1,000 mg by mouth every 6 (six) hours as needed for headache (pain)., Disp: , Rfl:    albuterol  (VENTOLIN  HFA) 108 (90 Base) MCG/ACT inhaler, USE 2 PUFFS BY MOUTH EVERY FOUR HOURS, AS NEEDED, FOR COUGHING/WHEEZING, Disp: 8.5 g, Rfl: 0   allopurinol  (ZYLOPRIM ) 100 MG tablet, Take 150 mg by mouth daily., Disp: , Rfl:    amiodarone  (PACERONE ) 200 MG tablet, Take 0.5 tablets (100 mg total) by mouth daily., Disp: 30 tablet, Rfl: 1   apixaban  (ELIQUIS ) 5 MG TABS tablet, Take 1 tablet (5 mg total) by mouth 2 (two) times daily., Disp: 60 tablet, Rfl: 6   budesonide -formoterol  (SYMBICORT ) 160-4.5 MCG/ACT inhaler, Inhale 2 puffs into the lungs 2 (two) times daily., Disp: 1 each, Rfl: 2   buPROPion  (WELLBUTRIN  XL) 300 MG 24 hr tablet, Take 300 mg by mouth every morning., Disp: , Rfl:    Cholecalciferol  (VITAMIN D3) 50 MCG (2000 UT) capsule, Take 1 capsule by mouth every morning., Disp: , Rfl:    Continuous Glucose Sensor (FREESTYLE LIBRE 3 PLUS SENSOR) MISC, as directed., Disp: , Rfl:    folic acid  (FOLVITE ) 1 MG tablet, Take 1 tablet (1 mg total) by mouth daily., Disp: 30 tablet, Rfl: 0   Glucagon  (BAQSIMI  ONE PACK) 3 MG/DOSE POWD, Place 1 Device into the nose as needed (Low blood sugar with impaired consciousness)., Disp: 2 each, Rfl: 3   insulin  lispro (HUMALOG ) 100 UNIT/ML KwikPen, Inject 15 Units into the skin 3 (three) times daily with meals. Plus  sliding scale, max dose 70 units /day, Disp: 25 mL, Rfl: 1   isosorbide -hydrALAZINE  (BIDIL ) 20-37.5 MG tablet, Take 1 tablet by mouth 3 (three) times daily., Disp: 270 tablet, Rfl: 3   LANTUS  SOLOSTAR 100 UNIT/ML Solostar Pen, Inject 30 Units into the skin daily., Disp: 15 mL, Rfl: 0   levothyroxine  (SYNTHROID ) 50 MCG tablet, TAKE 1 TABLET (50 MCG TOTAL) BY MOUTH DAILY BEFORE BREAKFAST (AM), Disp: 30 tablet, Rfl: 0   metoprolol  succinate (TOPROL -XL) 25 MG 24 hr tablet, Take 1 tablet (25 mg total) by mouth daily., Disp: 90 tablet, Rfl: 3   Omega-3 Fatty Acids (FISH OIL PO), Take 1 tablet by mouth daily., Disp: , Rfl:    pantoprazole  (PROTONIX ) 40 MG tablet, TAKE 1 TABLET (40 MG TOTAL) BY MOUTH DAILY(AM), Disp: 30 tablet, Rfl: 0   potassium chloride  SA (KLOR-CON  M) 20 MEQ tablet, Take 2 tablets (40 mEq total) by mouth 2 (two) times daily. (Patient taking  differently: Take 40 mEq by mouth daily.), Disp: 120 tablet, Rfl: 11   rosuvastatin  (CRESTOR ) 40 MG tablet, Take 1 tablet (40 mg total) by mouth daily. (Patient taking differently: Take 40 mg by mouth at bedtime.), Disp: 90 tablet, Rfl: 3   tirzepatide  (MOUNJARO ) 7.5 MG/0.5ML Pen, Inject 7.5 mg into the skin once a week., Disp: 6 mL, Rfl: 1   torsemide  (DEMADEX ) 20 MG tablet, Take 4 tablets (80 mg total) by mouth 2 (two) times daily. (Patient taking differently: Take 60 mg by mouth 2 (two) times daily.), Disp: , Rfl:    VITAMIN E PO, Take 1 tablet by mouth daily., Disp: , Rfl:    colchicine  0.6 MG tablet, Take 1.2 mg by mouth See admin instructions. Take 1.2 mg for flair up and an hour later take 0.6 mg if needed, Disp: , Rfl:    docusate sodium  (COLACE) 100 MG capsule, Take 2 capsules (200 mg total) by mouth at bedtime., Disp: 10 capsule, Rfl: 0   erythromycin ophthalmic ointment, Place 1 Application into the right eye 2 (two) times daily., Disp: , Rfl:    fluticasone  (FLONASE) 50 MCG/ACT nasal spray, Place 1 spray into both nostrils as needed for  allergies or rhinitis., Disp: , Rfl:    gabapentin  (NEURONTIN ) 100 MG capsule, Take 1 capsule (100 mg total) by mouth 3 (three) times daily. (Patient not taking: Reported on 05/28/2024), Disp: 270 capsule, Rfl: 1   Menthol -Camphor (ICY HOT PRO NO MESS EX), Apply 1 Application topically 3 (three) times daily as needed (arms, neuropathy lower extrimity, gout in hand.)., Disp: , Rfl:    NYAMYC  powder, Apply 1 Application topically 2 (two) times daily., Disp: , Rfl:    nystatin  cream (MYCOSTATIN ), Apply 1 Application topically 2 (two) times daily as needed for dry skin., Disp: , Rfl:    polyethylene glycol (MIRALAX  / GLYCOLAX ) 17 g packet, Take 17 g by mouth daily as needed for moderate constipation., Disp: 14 each, Rfl: 0   triamcinolone  ointment (KENALOG ) 0.1 %, Apply topically 2 (two) times daily., Disp: 454 g, Rfl: 0 Allergies  Allergen Reactions   Neurontin  [Gabapentin ] Nausea And Vomiting and Other (See Comments)    POSSIBLE SHAKING   Lyrica  [Pregabalin ] Other (See Comments)    Shaking    Other     Neurontin        Social History   Socioeconomic History   Marital status: Single    Spouse name: Not on file   Number of children: 1   Years of education: 12   Highest education level: 12th grade  Occupational History   Occupation: disabled  Tobacco Use   Smoking status: Every Day    Current packs/day: 1.00    Average packs/day: 1 pack/day for 44.0 years (44.0 ttl pk-yrs)    Types: E-cigarettes, Cigarettes   Smokeless tobacco: Former    Types: Snuff  Vaping Use   Vaping status: Former   Devices: 11/26/2018 stopped months ago  Substance and Sexual Activity   Alcohol use: Not Currently    Comment: occ   Drug use: Yes    Types: Crack cocaine, Marijuana, Oxycodone    Sexual activity: Not Currently  Other Topics Concern   Not on file  Social History Narrative   ** Merged History Encounter **       Lives in [place], in motel with sister.  They are looking to move but don't have a  place to go yet.     Social Drivers of Health  Financial Resource Strain: Low Risk  (02/09/2023)   Overall Financial Resource Strain (CARDIA)    Difficulty of Paying Living Expenses: Not hard at all  Food Insecurity: No Food Insecurity (03/09/2024)   Hunger Vital Sign    Worried About Running Out of Food in the Last Year: Never true    Ran Out of Food in the Last Year: Never true  Transportation Needs: Unmet Transportation Needs (03/09/2024)   PRAPARE - Transportation    Lack of Transportation (Medical): Yes    Lack of Transportation (Non-Medical): Yes  Physical Activity: Inactive (02/09/2023)   Exercise Vital Sign    Days of Exercise per Week: 0 days    Minutes of Exercise per Session: 0 min  Stress: No Stress Concern Present (02/09/2023)   [employer] of Occupational Health - Occupational Stress Questionnaire    Feeling of Stress : Not at all  Social Connections: Unknown (08/21/2023)   [facility] from [facility]   Social Network    Social Network: Not on file  Intimate Partner Violence: Not At Risk (03/09/2024)   Humiliation, Afraid, Rape, and Kick questionnaire    Fear of Current or Ex-Partner: No    Emotionally Abused: No    Physically Abused: No    Sexually Abused: No    Physical Exam      Future Appointments  Date Time Provider Department Center  05/31/2024 12:30 PM [facility] LAB [facility] None  06/11/2024 10:40 AM GI-BCG MM 2 GI-BCGMM GI-BREAST CE  06/11/2024  2:00 PM Carita Harlene CHRISTELLA JOSETTA [facility]  06/13/2024 11:45 AM Carita Harlene CHRISTELLA, PTA [facility]  06/17/2024  1:20 PM GI-315 CT 2 GI-315CT GI-315 W. WE  06/18/2024 11:00 AM Johnston Delon CROME, PT [facility]  06/20/2024 12:30 PM Johnston Delon CROME, PT [facility]  06/25/2024 10:20 AM Dartha Ernst, MD [facility]-LBENDO None  06/25/2024 11:00 AM Johnston Delon CROME, PT [facility]  06/27/2024 10:15 AM Johnston Delon CROME, PT [facility]  07/30/2024  3:10 PM Aniceto Daphne CROME, NP CVD-MAGST H&V       [name],  Paramedic [phone] [facility] Paramedic  05/28/24

## 2024-05-31 ENCOUNTER — Ambulatory Visit (HOSPITAL_COMMUNITY)
Admission: RE | Admit: 2024-05-31 | Discharge: 2024-05-31 | Disposition: A | Discharge status: Home / Self Care | Source: Ambulatory Visit | Attending: Cardiology | Admitting: Cardiology

## 2024-05-31 DIAGNOSIS — I5022 Chronic systolic (congestive) heart failure: Secondary | ICD-10-CM | POA: Insufficient documentation

## 2024-05-31 LAB — BASIC METABOLIC PANEL WITH GFR
Anion gap: 12 (ref 5–15)
BUN: 67 mg/dL — ABNORMAL HIGH (ref 8–23)
CO2: 22 mmol/L (ref 22–32)
Calcium: 8.9 mg/dL (ref 8.9–10.3)
Chloride: 107 mmol/L (ref 98–111)
Creatinine, Ser: 2.16 mg/dL — ABNORMAL HIGH (ref 0.44–1.00)
GFR, Estimated: 25 mL/min — ABNORMAL LOW (ref >60)
Glucose, Bld: 134 mg/dL — ABNORMAL HIGH (ref 70–99)
Potassium: 4.4 mmol/L (ref 3.5–5.1)
Sodium: 141 mmol/L (ref 135–145)

## 2024-06-03 ENCOUNTER — Ambulatory Visit (HOSPITAL_COMMUNITY): Payer: Self-pay | Admitting: Family Medicine

## 2024-06-04 ENCOUNTER — Other Ambulatory Visit (HOSPITAL_COMMUNITY): Payer: Self-pay

## 2024-06-04 NOTE — Progress Notes (Unsigned)
 Paramedicine Encounter    Patient ID: [name], female    DOB: January 16, 1961, 63 y.o.   MRN: [id number]   Complaints-leg pain   Edema-rt leg, but still much improved  Compliance with meds-yes   Pill box filled-yes  If so, by whom-paramedic   Refills needed-eliquis    Pt reports doing ok, she reports feeling like her rt leg is swollen and pain. It doesn't seem more swollen than usual.  Her weight is down from last week. Abd looks better and less bloated.  She denies increased sob, no dizziness, no c/p. No bleeding issues.  She is using her cardiomems.  Meds verified and pill box refilled.   She denies getting any messages or calls from kidney doc, however her phone is sporadic with working. So she is going to reach out to them as well.  I will check next week to see if she has spoken to anyone and if not I will call to ensure they have referral and help coordinate anything that I can.   CBG PTA-145  BP (!) 144/68   Pulse 60   Resp 18   Wt 212 lb (96.2 kg)   LMP  (LMP Unknown)   SpO2 94%   BMI 37.55 kg/m  Weight yesterday-? Last visit weight-220  Patient Care Team: Campbell Reynolds, NP as PCP - General Court Dorn PARAS, MD as PCP - Cardiology (Cardiology) Rolan Ezra GORMAN, MD as PCP - Advanced Heart Failure (Cardiology) Inocencio Soyla Lunger, MD as PCP - Electrophysiology (Cardiology) Court Dorn PARAS, MD as Consulting Physician (Cardiology) Darlean Ozell NOVAK, MD as Consulting Physician (Pulmonary Disease) System, Provider Not In  Patient Active Problem List   Diagnosis Date Noted   Persistent atrial fibrillation ([name]) 03/15/2024   Benign neoplasm of ascending colon 03/14/2024   Benign neoplasm of transverse colon 03/14/2024   Benign neoplasm of descending colon 03/14/2024   Benign neoplasm of sigmoid colon 03/14/2024   Chronic systolic congestive heart failure ([name]) 03/12/2024   Melena 03/12/2024   Heme positive stool 03/11/2024   GI bleed 03/08/2024   History of  CAD (coronary artery disease) 03/08/2024   CKD (chronic kidney disease), stage IV ([name]) 03/08/2024   Continuous dependence on cigarette smoking 03/08/2024   GAD (generalized anxiety disorder) 03/08/2024   HFrEF (heart failure with reduced ejection fraction) ([name]) 10/11/2023   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) and diastolic (congestive) heart failure ([name]) 10/10/2023   Pre-ulcerative calluses 04/25/2022   PAF (paroxysmal atrial fibrillation) ([name]) 05/05/2021   COPD (chronic obstructive pulmonary disease) ([name]) 05/05/2021   OSA (obstructive sleep apnea) 05/05/2021   Stage 3b chronic kidney disease ([name]) 05/05/2021   Pressure injury of skin 05/04/2021   Diabetic nephropathy ([name]) 01/02/2021   Low back pain 06/01/2020   Hyperlipidemia 04/03/2020   Peripheral artery disease ([name]) 12/26/2019   NICM (nonischemic cardiomyopathy) ([name]) 06/20/2019   Non-healing ulcer ([name]) 06/20/2019   Coagulation disorder ([name]) 08/09/2017   Depression 07/21/2017   At risk for adverse drug reaction 06/20/2017   Peripheral neuropathy 06/20/2017   S/P transmetatarsal amputation of foot, right ([name]) 06/05/2017   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) 05/19/2017   Obesity, class 2 02/24/2016   Anticoagulation management encounter 02/10/2016   Chronic sinus bradycardia 01/12/2016   Essential hypertension 12/22/2015   Demand ischemia ([name])    Acute on chronic diastolic CHF (congestive heart failure) ([name])    Symptomatic anemia 11/08/2015   Hypokalemia 11/08/2015   Tobacco  abuse 10/23/2015   Coronary artery disease    DOE (dyspnea on exertion) 04/29/2015   Paroxysmal atrial fibrillation ([name]) 01/16/2015   Carotid artery stenosis 01/16/2015   Insomnia 02/03/2014   S/P peripheral artery angioplasty - TurboHawk atherectomy; R SFA 09/11/2013    Class: Acute   Leg pain, bilateral 08/19/2013   Hypothyroidism 07/31/2013   History of cocaine  abuse ([name]) 06/13/2013   Long term current use of anticoagulant therapy 05/20/2013   Alcohol abuse    Narcotic abuse ([name])    Marijuana abuse    Alcoholic cirrhosis ([name])    Insulin  dependent type 2 diabetes mellitus ([name])     Current Outpatient Medications:    albuterol  (VENTOLIN  HFA) 108 (90 Base) MCG/ACT inhaler, USE 2 PUFFS BY MOUTH EVERY FOUR HOURS, AS NEEDED, FOR COUGHING/WHEEZING, Disp: 8.5 g, Rfl: 0   allopurinol  (ZYLOPRIM ) 100 MG tablet, Take 150 mg by mouth daily., Disp: , Rfl:    amiodarone  (PACERONE ) 200 MG tablet, Take 0.5 tablets (100 mg total) by mouth daily., Disp: 30 tablet, Rfl: 1   apixaban  (ELIQUIS ) 5 MG TABS tablet, Take 1 tablet (5 mg total) by mouth 2 (two) times daily., Disp: 60 tablet, Rfl: 6   budesonide -formoterol  (SYMBICORT ) 160-4.5 MCG/ACT inhaler, Inhale 2 puffs into the lungs 2 (two) times daily., Disp: 1 each, Rfl: 2   buPROPion  (WELLBUTRIN  XL) 300 MG 24 hr tablet, Take 300 mg by mouth every morning., Disp: , Rfl:    Cholecalciferol  (VITAMIN D3) 50 MCG (2000 UT) capsule, Take 1 capsule by mouth every morning., Disp: , Rfl:    colchicine  0.6 MG tablet, Take 1.2 mg by mouth See admin instructions. Take 1.2 mg for flair up and an hour later take 0.6 mg if needed, Disp: , Rfl:    Continuous Glucose Sensor (FREESTYLE LIBRE 3 PLUS SENSOR) MISC, as directed., Disp: , Rfl:    fluticasone  (FLONASE) 50 MCG/ACT nasal spray, Place 1 spray into both nostrils as needed for allergies or rhinitis., Disp: , Rfl:    folic acid  (FOLVITE ) 1 MG tablet, Take 1 tablet (1 mg total) by mouth daily., Disp: 30 tablet, Rfl: 0   insulin  lispro (HUMALOG ) 100 UNIT/ML KwikPen, Inject 15 Units into the skin 3 (three) times daily with meals. Plus sliding scale, max dose 70 units /day, Disp: 25 mL, Rfl: 1   isosorbide -hydrALAZINE  (BIDIL ) 20-37.5 MG tablet, Take 1 tablet by mouth 3 (three) times daily., Disp: 270 tablet, Rfl: 3   LANTUS  SOLOSTAR 100 UNIT/ML Solostar Pen, Inject 30 Units into the  skin daily., Disp: 15 mL, Rfl: 0   levothyroxine  (SYNTHROID ) 50 MCG tablet, TAKE 1 TABLET (50 MCG TOTAL) BY MOUTH DAILY BEFORE BREAKFAST (AM), Disp: 30 tablet, Rfl: 0   metoprolol  succinate (TOPROL -XL) 25 MG 24 hr tablet, Take 1 tablet (25 mg total) by mouth daily., Disp: 90 tablet, Rfl: 3   Omega-3 Fatty Acids (FISH OIL PO), Take 1 tablet by mouth daily., Disp: , Rfl:    pantoprazole  (PROTONIX ) 40 MG tablet, TAKE 1 TABLET (40 MG TOTAL) BY MOUTH DAILY(AM), Disp: 30 tablet, Rfl: 0   rosuvastatin  (CRESTOR ) 40 MG tablet, Take 1 tablet (40 mg total) by mouth daily. (Patient taking differently: Take 40 mg by mouth at bedtime.), Disp: 90 tablet, Rfl: 3   triamcinolone  ointment (KENALOG ) 0.1 %, Apply topically 2 (two) times daily., Disp: 454 g, Rfl: 0   VITAMIN E PO, Take 1 tablet by mouth daily., Disp: , Rfl:    acetaminophen  (TYLENOL ) 500 MG tablet,  Take 1,000 mg by mouth every 6 (six) hours as needed for headache (pain)., Disp: , Rfl:    docusate sodium  (COLACE) 100 MG capsule, Take 2 capsules (200 mg total) by mouth at bedtime., Disp: 10 capsule, Rfl: 0   erythromycin ophthalmic ointment, Place 1 Application into the right eye 2 (two) times daily., Disp: , Rfl:    gabapentin  (NEURONTIN ) 100 MG capsule, Take 1 capsule (100 mg total) by mouth 3 (three) times daily. (Patient not taking: Reported on 05/28/2024), Disp: 270 capsule, Rfl: 1   Glucagon  (BAQSIMI  ONE PACK) 3 MG/DOSE POWD, Place 1 Device into the nose as needed (Low blood sugar with impaired consciousness)., Disp: 2 each, Rfl: 3   Menthol -Camphor (ICY HOT PRO NO MESS EX), Apply 1 Application topically 3 (three) times daily as needed (arms, neuropathy lower extrimity, gout in hand.)., Disp: , Rfl:    NYAMYC  powder, Apply 1 Application topically 2 (two) times daily., Disp: , Rfl:    nystatin  cream (MYCOSTATIN ), Apply 1 Application topically 2 (two) times daily as needed for dry skin., Disp: , Rfl:    polyethylene glycol (MIRALAX  / GLYCOLAX ) 17 g  packet, Take 17 g by mouth daily as needed for moderate constipation., Disp: 14 each, Rfl: 0   potassium chloride  SA (KLOR-CON  M) 20 MEQ tablet, Take 2 tablets (40 mEq total) by mouth 2 (two) times daily. (Patient taking differently: Take 40 mEq by mouth daily.), Disp: 120 tablet, Rfl: 11   tirzepatide  (MOUNJARO ) 7.5 MG/0.5ML Pen, Inject 7.5 mg into the skin once a week., Disp: 6 mL, Rfl: 1   torsemide  (DEMADEX ) 20 MG tablet, Take 4 tablets (80 mg total) by mouth 2 (two) times daily. (Patient taking differently: Take 60 mg by mouth 2 (two) times daily.), Disp: , Rfl:  Allergies  Allergen Reactions   Neurontin  [Gabapentin ] Nausea And Vomiting and Other (See Comments)    POSSIBLE SHAKING   Lyrica  [Pregabalin ] Other (See Comments)    Shaking    Other     Neurontin        Social History   Socioeconomic History   Marital status: Single    Spouse name: Not on file   Number of children: 1   Years of education: 12   Highest education level: 12th grade  Occupational History   Occupation: disabled  Tobacco Use   Smoking status: Every Day    Current packs/day: 1.00    Average packs/day: 1 pack/day for 44.0 years (44.0 ttl pk-yrs)    Types: E-cigarettes, Cigarettes   Smokeless tobacco: Former    Types: Snuff  Vaping Use   Vaping status: Former   Devices: 11/26/2018 stopped months ago  Substance and Sexual Activity   Alcohol use: Not Currently    Comment: occ   Drug use: Yes    Types: Crack cocaine, Marijuana, Oxycodone    Sexual activity: Not Currently  Other Topics Concern   Not on file  Social History Narrative   ** Merged History Encounter **       Lives in [place], in motel with sister.  They are looking to move but don't have a place to go yet.     Social Drivers of Corporate investment banker Strain: Low Risk  (02/09/2023)   Overall Financial Resource Strain (CARDIA)    Difficulty of Paying Living Expenses: Not hard at all  Food Insecurity: No Food Insecurity (03/09/2024)    Hunger Vital Sign    Worried About Running Out of Food in the Last Year:  Never true    Ran Out of Food in the Last Year: Never true  Transportation Needs: Unmet Transportation Needs (03/09/2024)   PRAPARE - Transportation    Lack of Transportation (Medical): Yes    Lack of Transportation (Non-Medical): Yes  Physical Activity: Inactive (02/09/2023)   Exercise Vital Sign    Days of Exercise per Week: 0 days    Minutes of Exercise per Session: 0 min  Stress: No Stress Concern Present (02/09/2023)   [employer] of Occupational Health - Occupational Stress Questionnaire    Feeling of Stress : Not at all  Social Connections: Unknown (08/21/2023)   Received from [facility]   Social Network    Social Network: Not on file  Intimate Partner Violence: Not At Risk (03/09/2024)   Humiliation, Afraid, Rape, and Kick questionnaire    Fear of Current or Ex-Partner: No    Emotionally Abused: No    Physically Abused: No    Sexually Abused: No    Physical Exam      Future Appointments  Date Time Provider Department Center  06/11/2024 10:40 AM GI-BCG MM 2 GI-BCGMM GI-BREAST CE  06/11/2024  2:00 PM Carita Harlene CHRISTELLA JOSETTA [facility]  06/13/2024 11:45 AM Carita Harlene CHRISTELLA, PTA [facility]  06/17/2024  1:20 PM GI-315 CT 2 GI-315CT GI-315 W. WE  06/18/2024 11:00 AM Johnston Delon CROME, PT [facility]  06/20/2024 12:30 PM Johnston Delon CROME, PT [facility]  06/25/2024 10:20 AM Dartha Ernst, MD [facility] None  06/25/2024 11:00 AM Johnston Delon CROME, PT [facility]  06/27/2024 10:15 AM Carita Harlene CHRISTELLA, PTA [facility]  07/30/2024  3:10 PM Aniceto Daphne CROME, NP CVD-MAGST H&V       [name], Paramedic [phone] [facility] Paramedic  06/04/24

## 2024-06-10 ENCOUNTER — Other Ambulatory Visit (HOSPITAL_COMMUNITY): Payer: Self-pay | Admitting: Adult Health

## 2024-06-10 ENCOUNTER — Telehealth (HOSPITAL_COMMUNITY): Payer: Self-pay

## 2024-06-10 NOTE — Telephone Encounter (Signed)
 Contacted [facility] to request the following for refills:     [name], Paramedic [phone] 06/10/24

## 2024-06-11 ENCOUNTER — Encounter: Payer: Self-pay | Admitting: Physical Therapy

## 2024-06-11 ENCOUNTER — Ambulatory Visit: Admitting: Physical Therapy

## 2024-06-11 ENCOUNTER — Other Ambulatory Visit (HOSPITAL_COMMUNITY): Payer: Self-pay

## 2024-06-11 ENCOUNTER — Ambulatory Visit
Admission: RE | Admit: 2024-06-11 | Discharge: 2024-06-11 | Disposition: A | Discharge status: Home / Self Care | Source: Ambulatory Visit | Attending: Student | Admitting: Student

## 2024-06-11 DIAGNOSIS — Z1231 Encounter for screening mammogram for malignant neoplasm of breast: Secondary | ICD-10-CM

## 2024-06-11 DIAGNOSIS — Z7409 Other reduced mobility: Secondary | ICD-10-CM

## 2024-06-11 DIAGNOSIS — R262 Difficulty in walking, not elsewhere classified: Secondary | ICD-10-CM | POA: Diagnosis not present

## 2024-06-11 NOTE — Progress Notes (Signed)
 Paramedicine Encounter    Patient ID: [name], female    DOB: 06/01/1961, 63 y.o.   MRN: [id number]   Complaints-chronic pain   Edema-some but much improved   Compliance with meds-yes   Pill box filled-yes X 2 wks  If so, by whom-paramedic   Refills needed- isosorbide  Folic acid   Torsemide     Pt reports she is doing ok.  She has pain to her rt knee and it is swollen.  No edema to lower legs.  Pt denies bleeding issues, no c/p, no dizziness.  She started outpt PT.  Meds verified and 2 wks of pill boxes refilled.   Gave her the number to kidney doc so she can f/u with them, she reports she has not heard from them yet.    BP 128/60   Pulse 70   Resp 18   Wt 215 lb (97.5 kg)   LMP  (LMP Unknown)   SpO2 97%   BMI 38.09 kg/m  Weight yesterday-? Last visit weight-212  Patient Care Team: Campbell Reynolds, NP as PCP - General Court Dorn PARAS, MD as PCP - Cardiology (Cardiology) Rolan Ezra GORMAN, MD as PCP - Advanced Heart Failure (Cardiology) Inocencio Soyla Lunger, MD as PCP - Electrophysiology (Cardiology) Court Dorn PARAS, MD as Consulting Physician (Cardiology) Darlean Ozell NOVAK, MD as Consulting Physician (Pulmonary Disease) System, Provider Not In  Patient Active Problem List   Diagnosis Date Noted   Persistent atrial fibrillation ([name]) 03/15/2024   Benign neoplasm of ascending colon 03/14/2024   Benign neoplasm of transverse colon 03/14/2024   Benign neoplasm of descending colon 03/14/2024   Benign neoplasm of sigmoid colon 03/14/2024   Chronic systolic congestive heart failure ([name]) 03/12/2024   Melena 03/12/2024   Heme positive stool 03/11/2024   GI bleed 03/08/2024   History of CAD (coronary artery disease) 03/08/2024   CKD (chronic kidney disease), stage IV ([name]) 03/08/2024   Continuous dependence on cigarette smoking 03/08/2024   GAD (generalized anxiety disorder) 03/08/2024   HFrEF (heart failure with reduced ejection fraction) ([name]) 10/11/2023    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) and diastolic (congestive) heart failure ([name]) 10/10/2023   Pre-ulcerative calluses 04/25/2022   PAF (paroxysmal atrial fibrillation) ([name]) 05/05/2021   COPD (chronic obstructive pulmonary disease) ([name]) 05/05/2021   OSA (obstructive sleep apnea) 05/05/2021   Stage 3b chronic kidney disease ([name]) 05/05/2021   Pressure injury of skin 05/04/2021   Diabetic nephropathy ([name]) 01/02/2021   Low back pain 06/01/2020   Hyperlipidemia 04/03/2020   Peripheral artery disease ([name]) 12/26/2019   NICM (nonischemic cardiomyopathy) ([name]) 06/20/2019   Non-healing ulcer ([name]) 06/20/2019   Coagulation disorder ([name]) 08/09/2017   Depression 07/21/2017   At risk for adverse drug reaction 06/20/2017   Peripheral neuropathy 06/20/2017   S/P transmetatarsal amputation of foot, right ([name]) 06/05/2017   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) 05/19/2017   Obesity, class 2 02/24/2016   Anticoagulation management encounter 02/10/2016   Chronic sinus bradycardia 01/12/2016   Essential hypertension 12/22/2015   Demand ischemia ([name])    Acute on chronic diastolic CHF (congestive heart failure) ([name])    Symptomatic anemia 11/08/2015   Hypokalemia 11/08/2015   Tobacco abuse 10/23/2015   Coronary artery disease    DOE (dyspnea on exertion) 04/29/2015   Paroxysmal atrial fibrillation ([name]) 01/16/2015   Carotid artery stenosis 01/16/2015   Insomnia 02/03/2014   S/P peripheral artery angioplasty - TurboHawk atherectomy; R SFA 09/11/2013  Class: Acute   Leg pain, bilateral 08/19/2013   Hypothyroidism 07/31/2013   History of cocaine abuse ([name]) 06/13/2013   Long term current use of anticoagulant therapy 05/20/2013   Alcohol abuse    Narcotic abuse ([name])    Marijuana abuse    Alcoholic cirrhosis ([name])    Insulin  dependent type 2 diabetes mellitus ([name])     Current Outpatient Medications:     acetaminophen  (TYLENOL ) 500 MG tablet, Take 1,000 mg by mouth every 6 (six) hours as needed for headache (pain)., Disp: , Rfl:    albuterol  (VENTOLIN  HFA) 108 (90 Base) MCG/ACT inhaler, USE 2 PUFFS BY MOUTH EVERY FOUR HOURS, AS NEEDED, FOR COUGHING/WHEEZING, Disp: 8.5 g, Rfl: 0   allopurinol  (ZYLOPRIM ) 100 MG tablet, Take 150 mg by mouth daily., Disp: , Rfl:    amiodarone  (PACERONE ) 200 MG tablet, Take 0.5 tablets (100 mg total) by mouth daily., Disp: 30 tablet, Rfl: 1   budesonide -formoterol  (SYMBICORT ) 160-4.5 MCG/ACT inhaler, Inhale 2 puffs into the lungs 2 (two) times daily., Disp: 1 each, Rfl: 2   buPROPion  (WELLBUTRIN  XL) 300 MG 24 hr tablet, Take 300 mg by mouth every morning., Disp: , Rfl:    Cholecalciferol  (VITAMIN D3) 50 MCG (2000 UT) capsule, Take 1 capsule by mouth every morning., Disp: , Rfl:    Continuous Glucose Sensor (FREESTYLE LIBRE 3 PLUS SENSOR) MISC, as directed., Disp: , Rfl:    docusate sodium  (COLACE) 100 MG capsule, Take 2 capsules (200 mg total) by mouth at bedtime., Disp: 10 capsule, Rfl: 0   ELIQUIS  5 MG TABS tablet, TAKE 1 TABLET (5 MG TOTAL) BY MOUTH 2 (TWO) TIMES DAILY., Disp: 60 tablet, Rfl: 6   fluticasone  (FLONASE) 50 MCG/ACT nasal spray, Place 1 spray into both nostrils as needed for allergies or rhinitis., Disp: , Rfl:    folic acid  (FOLVITE ) 1 MG tablet, Take 1 tablet (1 mg total) by mouth daily., Disp: 30 tablet, Rfl: 0   insulin  lispro (HUMALOG ) 100 UNIT/ML KwikPen, Inject 15 Units into the skin 3 (three) times daily with meals. Plus sliding scale, max dose 70 units /day, Disp: 25 mL, Rfl: 1   isosorbide -hydrALAZINE  (BIDIL ) 20-37.5 MG tablet, Take 1 tablet by mouth 3 (three) times daily., Disp: 270 tablet, Rfl: 3   LANTUS  SOLOSTAR 100 UNIT/ML Solostar Pen, Inject 30 Units into the skin daily., Disp: 15 mL, Rfl: 0   levothyroxine  (SYNTHROID ) 50 MCG tablet, TAKE 1 TABLET (50 MCG TOTAL) BY MOUTH DAILY BEFORE BREAKFAST (AM), Disp: 30 tablet, Rfl: 0   metoprolol   succinate (TOPROL -XL) 25 MG 24 hr tablet, Take 1 tablet (25 mg total) by mouth daily., Disp: 90 tablet, Rfl: 3   pantoprazole  (PROTONIX ) 40 MG tablet, TAKE 1 TABLET (40 MG TOTAL) BY MOUTH DAILY(AM), Disp: 30 tablet, Rfl: 0   VITAMIN E PO, Take 1 tablet by mouth daily., Disp: , Rfl:    colchicine  0.6 MG tablet, Take 1.2 mg by mouth See admin instructions. Take 1.2 mg for flair up and an hour later take 0.6 mg if needed, Disp: , Rfl:    erythromycin ophthalmic ointment, Place 1 Application into the right eye 2 (two) times daily., Disp: , Rfl:    gabapentin  (NEURONTIN ) 100 MG capsule, Take 1 capsule (100 mg total) by mouth 3 (three) times daily. (Patient not taking: Reported on 05/28/2024), Disp: 270 capsule, Rfl: 1   Glucagon  (BAQSIMI  ONE PACK) 3 MG/DOSE POWD, Place 1 Device into the nose as needed (Low blood sugar with impaired consciousness)., Disp:  2 each, Rfl: 3   Menthol -Camphor (ICY HOT PRO NO MESS EX), Apply 1 Application topically 3 (three) times daily as needed (arms, neuropathy lower extrimity, gout in hand.)., Disp: , Rfl:    NYAMYC  powder, Apply 1 Application topically 2 (two) times daily., Disp: , Rfl:    nystatin  cream (MYCOSTATIN ), Apply 1 Application topically 2 (two) times daily as needed for dry skin., Disp: , Rfl:    Omega-3 Fatty Acids (FISH OIL PO), Take 1 tablet by mouth daily., Disp: , Rfl:    polyethylene glycol (MIRALAX  / GLYCOLAX ) 17 g packet, Take 17 g by mouth daily as needed for moderate constipation., Disp: 14 each, Rfl: 0   potassium chloride  SA (KLOR-CON  M) 20 MEQ tablet, Take 2 tablets (40 mEq total) by mouth 2 (two) times daily. (Patient taking differently: Take 40 mEq by mouth daily.), Disp: 120 tablet, Rfl: 11   rosuvastatin  (CRESTOR ) 40 MG tablet, Take 1 tablet (40 mg total) by mouth daily. (Patient taking differently: Take 40 mg by mouth at bedtime.), Disp: 90 tablet, Rfl: 3   tirzepatide  (MOUNJARO ) 7.5 MG/0.5ML Pen, Inject 7.5 mg into the skin once a week., Disp: 6  mL, Rfl: 1   torsemide  (DEMADEX ) 20 MG tablet, Take 4 tablets (80 mg total) by mouth 2 (two) times daily. (Patient taking differently: Take 60 mg by mouth 2 (two) times daily.), Disp: , Rfl:    triamcinolone  ointment (KENALOG ) 0.1 %, Apply topically 2 (two) times daily., Disp: 454 g, Rfl: 0 Allergies  Allergen Reactions   Neurontin  [Gabapentin ] Nausea And Vomiting and Other (See Comments)    POSSIBLE SHAKING   Lyrica  [Pregabalin ] Other (See Comments)    Shaking    Other     Neurontin        Social History   Socioeconomic History   Marital status: Single    Spouse name: Not on file   Number of children: 1   Years of education: 12   Highest education level: 12th grade  Occupational History   Occupation: disabled  Tobacco Use   Smoking status: Every Day    Current packs/day: 1.00    Average packs/day: 1 pack/day for 44.0 years (44.0 ttl pk-yrs)    Types: E-cigarettes, Cigarettes   Smokeless tobacco: Former    Types: Snuff  Vaping Use   Vaping status: Former   Devices: 11/26/2018 stopped months ago  Substance and Sexual Activity   Alcohol use: Not Currently    Comment: occ   Drug use: Yes    Types: Crack cocaine, Marijuana, Oxycodone    Sexual activity: Not Currently  Other Topics Concern   Not on file  Social History Narrative   ** Merged History Encounter **       Lives in [place], in motel with sister.  They are looking to move but don't have a place to go yet.     Social Drivers of Corporate investment banker Strain: Low Risk  (02/09/2023)   Overall Financial Resource Strain (CARDIA)    Difficulty of Paying Living Expenses: Not hard at all  Food Insecurity: No Food Insecurity (03/09/2024)   Hunger Vital Sign    Worried About Running Out of Food in the Last Year: Never true    Ran Out of Food in the Last Year: Never true  Transportation Needs: Unmet Transportation Needs (03/09/2024)   PRAPARE - Transportation    Lack of Transportation (Medical): Yes    Lack of  Transportation (Non-Medical): Yes  Physical Activity: Inactive (02/09/2023)  Exercise Vital Sign    Days of Exercise per Week: 0 days    Minutes of Exercise per Session: 0 min  Stress: No Stress Concern Present (02/09/2023)   [employer] of Occupational Health - Occupational Stress Questionnaire    Feeling of Stress : Not at all  Social Connections: Unknown (08/21/2023)   Received from [facility]   Social Network    Social Network: Not on file  Intimate Partner Violence: Not At Risk (03/09/2024)   Humiliation, Afraid, Rape, and Kick questionnaire    Fear of Current or Ex-Partner: No    Emotionally Abused: No    Physically Abused: No    Sexually Abused: No    Physical Exam      Future Appointments  Date Time Provider Department Center  06/13/2024 11:45 AM Carita Harlene CHRISTELLA JOSETTA [facility]  06/17/2024  1:20 PM GI-315 CT 2 GI-[address]  06/18/2024 11:00 AM Johnston Delon CROME, PT [facility]  06/20/2024 12:30 PM Johnston Delon CROME, PT [facility]  06/25/2024 10:20 AM Dartha Ernst, MD [facility] None  06/25/2024 11:00 AM Johnston Delon CROME, PT [facility]  06/27/2024 10:15 AM Carita Harlene CHRISTELLA JOSETTA [facility]  07/30/2024  3:10 PM Aniceto Daphne CROME, NP [facility]       [name], Paramedic [phone] [facility] Paramedic  06/11/24

## 2024-06-11 NOTE — Therapy (Signed)
 OUTPATIENT PHYSICAL THERAPY LOWER EXTREMITY TREATMENT    Patient Name: [name] MRN: [id number] DOB:1961-08-17, 63 y.o., female Today's Date: 06/11/2024  END OF SESSION:  PT End of Session - 06/11/24 1405     Visit Number 2    Number of Visits 12    Date for PT Re-Evaluation 07/05/24    Authorization Type [employer] Dual Complete    PT Start Time 0202    PT Stop Time 0240    PT Time Calculation (min) 38 min          Past Medical History:  Diagnosis Date   Acute renal failure ([name])    Acute respiratory failure with hypoxia ([name]) 12/02/2019   Alcohol abuse    Alcoholic cirrhosis ([name])    Anemia    Anxiety    Arthritis    knees (11/26/2018)   B12 deficiency    CAD (coronary artery disease)    a. 11/10/2014 Cath: LM nl, LAD min irregs, D1 30 ost, D2 50d, LCX 29m, OM1 80 p/m (1.5 mm vessel), OM2 60m, RCA nondom 67m-->med rx.. Demand ischemia in the setting of rapid a-fib.   Cardiomyopathy ([name])    Carotid artery disease ([name])    a. 01/2015 Carotid Angio: RICA 100, LICA 95p; b. 02/2015 s/p L CEA; c. 05/2019 Carotid U/S: RICA 100. RECA >50. LICA 1-39%.   Cellulitis    lower extremities   Cellulitis in diabetic foot ([name]) 07/08/2019   CHF (congestive heart failure) ([name])    Chronic combined systolic and diastolic CHF (congestive heart failure) ([name])    a. 10/2014 Echo: EF 40-45%; b. 10/2018 Echo: EF 45-50%, gr2 DD; c. 11/2019 Echo: EF 50%, mild LVH, gr2 DD (restrictive), antlat HK, Nl RV fxn. Mild BAE. RVSP 59.73mmHg.   CKD (chronic kidney disease), stage III ([name])    Cocaine abuse ([name])    COPD (chronic obstructive pulmonary disease) ([name])    Critical ischemia of foot ([name]) 06/07/2021   Critical limb ischemia     Critical lower limb ischemia ([name])    Demand ischemia ([name]) 10/29/2014   Depression    Diabetes mellitus without complication ([name])    Diabetic peripheral neuropathy ([name])    DVT (deep venous thrombosis) ([name])    Dyspnea    Elevated troponin    a. Chronic  elevation.   Fall 05/05/2021   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[name])    Peripheral arterial disease/critical limb ischemia     GERD (gastroesophageal reflux disease)    Hyperlipemia    Hypertension    Hypokalemia    Hypomagnesemia    Hypothyroidism    Marijuana abuse    Narcotic abuse ([name])    Noncompliance    NSVT (nonsustained ventricular tachycardia) ([name])    Obesity    Osteomyelitis ([name]) 06/21/2019   PAF (paroxysmal atrial fibrillation) ([name])    Paroxysmal atrial tachycardia ([name])    Peripheral arterial disease ([name])    a. 01/2015 Angio/PTA: RSFA 99 (atherectomy/pta) - 1 vessel runoff via diff dzs peroneal; b. 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   Pneumonia    once or twice (11/26/2018)   Poorly controlled type 2 diabetes mellitus ([name])    Renal disorder    Renal insufficiency    a. 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   SIRS (systemic inflammatory response syndrome) ([name]) 04/06/2017   Sleep apnea    couldn't handle wearing the mask (11/26/2018)   Symptomatic bradycardia    a. Avoid AV blocking agent  per EP. Prev req temp wire in 2017.   Tobacco abuse    Wrist fracture, left, closed, initial encounter 01/29/2015   Past Surgical History:  Procedure Laterality Date   ABDOMINAL AORTOGRAM N/A 06/26/2019   Procedure: ABDOMINAL AORTOGRAM;  Surgeon: Darron Deatrice LABOR, MD;  Location: [facility];  Service: Cardiovascular;  Laterality: N/A;   ABDOMINAL AORTOGRAM W/LOWER EXTREMITY N/A 06/07/2021   Procedure: ABDOMINAL AORTOGRAM W/LOWER EXTREMITY;  Surgeon: Court Dorn PARAS, MD;  Location: [facility];  Service: Cardiovascular;  Laterality: N/A;   ABDOMINAL AORTOGRAM W/LOWER EXTREMITY N/A 08/11/2023   Procedure: ABDOMINAL AORTOGRAM W/LOWER EXTREMITY;  Surgeon: Magda Debby SAILOR, MD;  Location: [facility];  Service: Cardiovascular;  Laterality: N/A;   AMPUTATION Right 06/14/2017   Procedure: Right foot transmetatarsal amputation;  Surgeon: Harden Jerona GAILS, MD;  Location: [facility] OR;  Service: Orthopedics;  Laterality: Right;   AMPUTATION Left 06/28/2019   Procedure: AMPUTATION LEFT FIFTH TOE;  Surgeon: Oris Krystal FALCON, MD;  Location: [facility] OR;  Service: Vascular;  Laterality: Left;   AMPUTATION TOE Right 04/28/2017   Procedure: AMPUTATION OF RIGHT SECOND RAY;  Surgeon: Harden Jerona GAILS, MD;  Location: [facility] OR;  Service: Orthopedics;  Laterality: Right;   CARDIAC CATHETERIZATION     CARDIAC CATHETERIZATION N/A 01/12/2016   Procedure: Temporary Wire;  Surgeon: Lynwood Schilling, MD;  Location: [facility];  Service: Cardiovascular;  Laterality: N/A;   CARDIOVERSION  ~ 02/2013   twice    CARDIOVERSION N/A 09/26/2023   Procedure: CARDIOVERSION (CATH LAB);  Surgeon: Alvan Ronal BRAVO, MD;  Location: [facility] INVASIVE CV LAB;  Service: Cardiovascular;  Laterality: N/A;   CAROTID ANGIOGRAM N/A 01/15/2015   Procedure: CAROTID ANGIOGRAM;  Surgeon: Dorn PARAS Court, MD;  Location: [facility] CATH LAB;  Service: Cardiovascular;  Laterality: N/A;   COLONOSCOPY N/A 03/14/2024   Procedure: COLONOSCOPY;  Surgeon: Albertus Gordy HERO, MD;  Location: [facility] ENDOSCOPY;  Service: Gastroenterology;  Laterality: N/A;   DILATION AND CURETTAGE OF UTERUS  1988   ENDARTERECTOMY Left 02/19/2015   Procedure: LEFT CAROTID ENDARTERECTOMY ;  Surgeon: Gaile LELON New, MD;  Location: [facility] OR;  Service: Vascular;  Laterality: Left;   ESOPHAGOGASTRODUODENOSCOPY N/A 03/13/2024   Procedure: EGD (ESOPHAGOGASTRODUODENOSCOPY);  Surgeon: Albertus Gordy HERO, MD;  Location: [facility] ENDOSCOPY;  Service: Gastroenterology;  Laterality: N/A;   FEMORAL-TIBIAL BYPASS GRAFT Left 06/28/2019   Procedure: BYPASS GRAFT LEFT LEG FEMORAL TO ANTERIOR TIBIAL ARTERY using LEFT GREATER SAPHENOUS VEIN;  Surgeon: Oris Krystal FALCON, MD;  Location: [facility] OR;  Service: Vascular;  Laterality: Left;   LEFT HEART CATHETERIZATION WITH CORONARY ANGIOGRAM N/A 10/31/2014   Procedure: LEFT HEART CATHETERIZATION WITH CORONARY ANGIOGRAM;  Surgeon: Lonni JONETTA Cash,  MD;  Location: [facility] CATH LAB;  Service: Cardiovascular;  Laterality: N/A;   LOWER EXTREMITY ANGIOGRAM N/A 09/10/2013   Procedure: LOWER EXTREMITY ANGIOGRAM;  Surgeon: Dorn PARAS Court, MD;  Location: [facility] CATH LAB;  Service: Cardiovascular;  Laterality: N/A;   LOWER EXTREMITY ANGIOGRAM N/A 01/15/2015   Procedure: LOWER EXTREMITY ANGIOGRAM;  Surgeon: Dorn PARAS Court, MD;  Location: [facility] CATH LAB;  Service: Cardiovascular;  Laterality: N/A;   LOWER EXTREMITY ANGIOGRAPHY N/A 04/13/2017   Procedure: Lower Extremity Angiography;  Surgeon: Court Dorn PARAS, MD;  Location: [facility] INVASIVE CV LAB;  Service: Cardiovascular;  Laterality: N/A;   LOWER EXTREMITY ANGIOGRAPHY Left 06/26/2019   Procedure: LOWER EXTREMITY ANGIOGRAPHY;  Surgeon: Darron Deatrice LABOR, MD;  Location: [facility];  Service: Cardiovascular;  Laterality:  Left;   PERIPHERAL VASCULAR ATHERECTOMY Right 06/07/2021   Procedure: PERIPHERAL VASCULAR ATHERECTOMY;  Surgeon: Court Dorn PARAS, MD;  Location: [facility] INVASIVE CV LAB;  Service: Cardiovascular;  Laterality: Right;   PERIPHERAL VASCULAR BALLOON ANGIOPLASTY Left 06/26/2019   Procedure: PERIPHERAL VASCULAR BALLOON ANGIOPLASTY;  Surgeon: Darron Deatrice LABOR, MD;  Location: [facility];  Service: Cardiovascular;  Laterality: Left;  unable to cross lt sfa occlusion   PERIPHERAL VASCULAR INTERVENTION  04/13/2017   Procedure: Peripheral Vascular Intervention;  Surgeon: Court Dorn PARAS, MD;  Location: [facility] INVASIVE CV LAB;  Service: Cardiovascular;;   PERIPHERAL VASCULAR INTERVENTION  08/11/2023   Procedure: PERIPHERAL VASCULAR INTERVENTION;  Surgeon: Magda Debby SAILOR, MD;  Location: [facility];  Service: Cardiovascular;;   PRESSURE SENSOR/CARDIOMEMS N/A 02/05/2020   Procedure: PRESSURE SENSOR/CARDIOMEMS;  Surgeon: Rolan Ezra RAMAN, MD;  Location: [facility] INVASIVE CV LAB;  Service: Cardiovascular;  Laterality: N/A;   VEIN HARVEST Left 06/28/2019   Procedure: LEFT LEG GREATER SAPHENOUS VEIN HARVEST;   Surgeon: Oris Krystal FALCON, MD;  Location: [facility] OR;  Service: Vascular;  Laterality: Left;   Patient Active Problem List   Diagnosis Date Noted   Persistent atrial fibrillation ([name]) 03/15/2024   Benign neoplasm of ascending colon 03/14/2024   Benign neoplasm of transverse colon 03/14/2024   Benign neoplasm of descending colon 03/14/2024   Benign neoplasm of sigmoid colon 03/14/2024   Chronic systolic congestive heart failure ([name]) 03/12/2024   Melena 03/12/2024   Heme positive stool 03/11/2024   GI bleed 03/08/2024   History of CAD (coronary artery disease) 03/08/2024   CKD (chronic kidney disease), stage IV ([name]) 03/08/2024   Continuous dependence on cigarette smoking 03/08/2024   GAD (generalized anxiety disorder) 03/08/2024   HFrEF (heart failure with reduced ejection fraction) ([name]) 10/11/2023   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) and diastolic (congestive) heart failure ([name]) 10/10/2023   Pre-ulcerative calluses 04/25/2022   PAF (paroxysmal atrial fibrillation) ([name]) 05/05/2021   COPD (chronic obstructive pulmonary disease) ([name]) 05/05/2021   OSA (obstructive sleep apnea) 05/05/2021   Stage 3b chronic kidney disease ([name]) 05/05/2021   Pressure injury of skin 05/04/2021   Diabetic nephropathy ([name]) 01/02/2021   Low back pain 06/01/2020   Hyperlipidemia 04/03/2020   Peripheral artery disease ([name]) 12/26/2019   NICM (nonischemic cardiomyopathy) ([name]) 06/20/2019   Non-healing ulcer ([name]) 06/20/2019   Coagulation disorder ([name]) 08/09/2017   Depression 07/21/2017   At risk for adverse drug reaction 06/20/2017   Peripheral neuropathy 06/20/2017   S/P transmetatarsal amputation of foot, right ([name]) 06/05/2017   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) 05/19/2017   Obesity, class 2 02/24/2016   Anticoagulation management encounter 02/10/2016   Chronic sinus bradycardia 01/12/2016   Essential hypertension 12/22/2015    Demand ischemia ([name])    Acute on chronic diastolic CHF (congestive heart failure) ([name])    Symptomatic anemia 11/08/2015   Hypokalemia 11/08/2015   Tobacco abuse 10/23/2015   Coronary artery disease    DOE (dyspnea on exertion) 04/29/2015   Paroxysmal atrial fibrillation ([name]) 01/16/2015   Carotid artery stenosis 01/16/2015   Insomnia 02/03/2014   S/P peripheral artery angioplasty - [place]bo[place] atherectomy; R SFA 09/11/2013    Class: Acute   Leg pain, bilateral 08/19/2013   Hypothyroidism 07/31/2013   History of cocaine abuse ([name]) 06/13/2013   Long term current use of anticoagulant therapy 05/20/2013   Alcohol abuse    Narcotic abuse ([name])    Marijuana abuse  Alcoholic cirrhosis ([name])    Insulin  dependent type 2 diabetes mellitus ([name])     PCP: Campbell Reynolds NP  REFERRING PROVIDER: Magda Ned MD  REFERRING DIAG: weakness and deconditioning  THERAPY DIAG:  Difficulty in walking, not elsewhere classified  Decreased functional mobility and endurance  Rationale for Evaluation and Treatment: Rehabilitation  ONSET DATE: chronic   SUBJECTIVE:   SUBJECTIVE STATEMENT: My feet and legs are killing me. I had a long walk into the Drs office earlier today. Back started hurting too.    EVAL: Pt here to work on general mobility following vascular surgery.  Today is the first day she is using her new walker (Rollator).  She needs to go back to the foot doctor for new shoe.  She is difficulty walking due to recent surgeries and deconditioning.  She fell last year because her foot could not there was delayed healing .   2024 she had Stent Rt LE , Bypass LLE.     She does not do much walking. Walks to BR and the picnic table and back.   PERTINENT HISTORY: Neuropathy severe peripheral arterial disease  67 old female with a history of CHF, diabetes, CAD, LE swelling , Rt and Lt. Foot amputations    PAIN:  Are you having pain? Yes: NPRS scale: 10 Pain location: feet and  legs, also back today  Pain description: Sore  Aggravating factors: walking  Relieving factors: sitting   PRECAUTIONS: Fallweakness, Rt knee   RED FLAGS: None   WEIGHT BEARING RESTRICTIONS: No  FALLS:  Has patient fallen in last 6 months? Yes. Number of falls 3  LIVING ENVIRONMENT: Lives with: lives with their family Lives in: House/apartment Stairs: No Has following equipment at home: Walker - 4 wheeledcane, shower lift   OCCUPATION: not working  PLOF: Independent with basic ADLs, Requires assistive device for independence, Needs assistance with homemaking, and Leisure: mostly sedentary , games on her phone   PATIENT GOALS: I want to be able to walk more   NEXT MD VISIT: as needed   OBJECTIVE:  Note: Objective measures were completed at Evaluation unless otherwise noted.  DIAGNOSTIC FINDINGS: none   PATIENT SURVEYS:  FABQ: Fear-Avoidance Beliefs Questionnaire (FABQ)  Date tested 05/24/24  My pain was caused by physical activity   6   Completely Agree  2. Physical activity makes my pain worse 6   Completely Agree  3. Physical activity might harm my back 0    Completely disagree  4. I should not do physical activities which (might) make my pain worse 6   Completely Agree  5. I cannot do physical activities which (might) make my pain worse 5  6. My pain was caused by work or by an accident at work 0    Completely disagree  7. My work aggravated my pain 0    Completely disagree  8. I have a claim for compensation for my pain 0    Completely disagree  9. My work is too heavy for me 4  10. My work makes or would make my pain worse 4  11. My work might harm my back 4  12. I should not do my normal work with my present pain 6   Completely Agree  13. I cannot do my normal work with my present pain 6   Completely Agree  14. I cannot do my normal work until my pain is treated 0    Completely disagree  15. I do not think  that I will be back to my normal work within 3 months 0     Completely disagree  16. I do not think that I will ever be able to go back to that work 5  Total score  52   Scoring Scale 1: fear-avoidance beliefs about work - items 6,7,9,10,11,12,15 or 16. Add responses, divide by  Scale 2: fear-avoidance beliefs about physical activity - items 2,3,4,5   COGNITION: Overall cognitive status: Within functional limits for tasks assessed     SENSATION: neuropathy  EDEMA:  Swelling present in bilateral lower extremities not triggered   POSTURE: rounded shoulders, forward head, and increased thoracic kyphosis  LOWER EXTREMITY ROM:WFL  LOWER EXTREMITY MMT:  MMT Right eval Left eval  Hip flexion 5 5  Hip extension    Hip abduction    Hip adduction    Hip internal rotation    Hip external rotation    Knee flexion 5 4+   Knee extension 5 5  Ankle dorsiflexion    Ankle plantarflexion    Ankle inversion    Ankle eversion     (Blank rows = not tested)   FUNCTIONAL TESTS:  5 times sit to stand: 15 sec   GAIT: Distance walked: 161 Assistive device utilized: Environmental consultant - 4 wheeled Level of assistance: SBA Comments: needs 1 standing rest break O2 saturation 98% heart rate 96                                                                                                                                TREATMENT DATE:  [facility] Adult PT Treatment:                                                DATE: 06/11/24 Therapeutic Exercise: LAQ 2# 10 x 2  March 2# 10 x 2  STS x 10 - rolator in front  Nustep L4 x 3 min, L3 x 2 min Seated heel and toe raises Supine bridge  Supine march Side clams x 10 each , AROM    [facility] Adult PT Treatment:                                                DATE: 05/24/24 TTherapeutic Activity: Sit to stand using rollator walker standing  alternating march Standing hip abduction  Long arc quads 2-minute walk test  Self Care: Plan of care ,expectations, home exercise program, endurance, and attendance  policy   PATIENT EDUCATION:  Education details: See above Person educated: Patient Education method: Programmer, multimedia, Facilities manager, Verbal cues, and Handouts Education comprehension: verbalized understanding, returned demonstration, and needs further education  HOME EXERCISE PROGRAM: Access Code: [email or web address] URL: [email or web address]  Date: 05/24/2024 Prepared by: Delon Norma  Exercises - Sit to Stand with Arm Reach Toward Target  - 1-3 x daily - 7 x weekly - 2-3 sets - 10 reps - 5 hold - Seated Long Arc Quad  - 1-3 x daily - 7 x weekly - 2 sets - 10 reps - 5 hold - Standing March with Counter Support  - 1-3 x daily - 7 x weekly - 2 sets - 10 reps  ASSESSMENT:  CLINICAL IMPRESSION: Pt reports compliance with HEP 3 x per day. She completes all prescribed exercises despite pain level reported. Will plan to assess and progress as tolerated to increased functional ability.   EVAL: Patient is a 62y.o. female who was seen today for physical therapy evaluation and treatment for deconditioning and difficulty walking due to previous vascular surgery in 2024.  Since that time the patient has not been able to walk as she was previously.  She is limited by options for foot where her right midfoot amputation poses a fall risk due to improper shoe she plans to get new shoes very soon.  Vital signs were stable during her walk but she does tire quickly.  Patient was given a basic home exercise program and encouraged to take multiple breaks throughout the day from sitting in her recliner to stand do some light walking and improve circulation to her lower body.  OBJECTIVE IMPAIRMENTS: Abnormal gait, cardiopulmonary status limiting activity, decreased activity tolerance, decreased balance, decreased cognition, decreased mobility, difficulty walking, decreased strength, increased edema, increased fascial restrictions, obesity, and pain.   ACTIVITY LIMITATIONS: carrying, lifting, standing, sleeping,  stairs, transfers, and locomotion level  PARTICIPATION LIMITATIONS: meal prep, cleaning, laundry, interpersonal relationship, driving, shopping, and community activity  PERSONAL FACTORS: Social background, Time since onset of injury/illness/exacerbation, and 3+ comorbidities: High risk for major lower extremity amputation, congestive heart failure, diabetes, smoking are also affecting patient's functional outcome.   REHAB POTENTIAL: Fair see above comorbidities  CLINICAL DECISION MAKING: Stable/uncomplicated  EVALUATION COMPLEXITY: Low   GOALS: Goals reviewed with patient? Yes  LONG TERM GOALS: Target date: 07/19/2024   Patient will be able to improve 2 min walk test distance by 50 feet or more with LRAD and no increase in pain.   Baseline: 161 with 1 standing rest break  Goal status: INITIAL  2.  Patient will be independent with final HEP upon discharge from PT and report consistent benefit following exercise completion.    Baseline:  Goal status: INITIAL  3.  Pt will be able to demonstrate good standing balance as evidenced by balance recovery from min dynamic balance challenges.   Baseline:  Goal status: INITIAL  4.  Patient will tolerate 15 minutes of cardio to work toward improved activity tolerance  Baseline:  Goal status: INITIAL  5.  Patient will be able to negotiate obstacles with her walker including curbs and inclines/declines with safety.  Baseline:  Goal status: INITIAL  PLAN: Feel like that PT FREQUENCY: 2x/week  PT DURATION: 6 weeks  PLANNED INTERVENTIONS: 97164- PT Re-evaluation, 97750- Physical Performance Testing, 97110-Therapeutic exercises, 97530- Therapeutic activity, 97112- Neuromuscular re-education, 97535- Self Care, [id number]- Manual therapy, [phone]- Gait training, Patient/Family education, Balance training, and DME instructions  PLAN FOR NEXT SESSION: NuStep, review home exercise program get into semireclined position consider adding straight leg  raises bridges and outer hip strength balance as tolerated encouraged patient to go to the doctor and get new shoe   Harlene Persons, PTA 06/11/24 2:49 PM Phone: [phone] Fax: [phone]

## 2024-06-13 ENCOUNTER — Ambulatory Visit: Admitting: Physical Therapy

## 2024-06-13 ENCOUNTER — Encounter: Payer: Self-pay | Admitting: Physical Therapy

## 2024-06-13 DIAGNOSIS — R262 Difficulty in walking, not elsewhere classified: Secondary | ICD-10-CM

## 2024-06-13 DIAGNOSIS — Z7409 Other reduced mobility: Secondary | ICD-10-CM

## 2024-06-13 NOTE — Therapy (Signed)
 OUTPATIENT PHYSICAL THERAPY LOWER EXTREMITY TREATMENT    Patient Name: [name] MRN: [id number] DOB:1961-06-05, 63 y.o., female Today's Date: 06/13/2024  END OF SESSION:  PT End of Session - 06/13/24 1150     Visit Number 3    Number of Visits 12    Date for PT Re-Evaluation 07/05/24    Authorization Type [employer] Dual Complete    PT Start Time 1145    PT Stop Time 1225    PT Time Calculation (min) 40 min          Past Medical History:  Diagnosis Date   Acute renal failure ([name])    Acute respiratory failure with hypoxia ([name]) 12/02/2019   Alcohol abuse    Alcoholic cirrhosis ([name])    Anemia    Anxiety    Arthritis    knees (11/26/2018)   B12 deficiency    CAD (coronary artery disease)    a. 11/10/2014 Cath: LM nl, LAD min irregs, D1 30 ost, D2 50d, LCX 31m, OM1 80 p/m (1.5 mm vessel), OM2 14m, RCA nondom 66m-->med rx.. Demand ischemia in the setting of rapid a-fib.   Cardiomyopathy ([name])    Carotid artery disease ([name])    a. 01/2015 Carotid Angio: RICA 100, LICA 95p; b. 02/2015 s/p L CEA; c. 05/2019 Carotid U/S: RICA 100. RECA >50. LICA 1-39%.   Cellulitis    lower extremities   Cellulitis in diabetic foot ([name]) 07/08/2019   CHF (congestive heart failure) ([name])    Chronic combined systolic and diastolic CHF (congestive heart failure) ([name])    a. 10/2014 Echo: EF 40-45%; b. 10/2018 Echo: EF 45-50%, gr2 DD; c. 11/2019 Echo: EF 50%, mild LVH, gr2 DD (restrictive), antlat HK, Nl RV fxn. Mild BAE. RVSP 59.62mmHg.   CKD (chronic kidney disease), stage III ([name])    Cocaine abuse ([name])    COPD (chronic obstructive pulmonary disease) ([name])    Critical ischemia of foot ([name]) 06/07/2021   Critical limb ischemia     Critical lower limb ischemia ([name])    Demand ischemia ([name]) 10/29/2014   Depression    Diabetes mellitus without complication ([name])    Diabetic peripheral neuropathy ([name])    DVT (deep venous thrombosis) ([name])    Dyspnea    Elevated troponin    a. Chronic  elevation.   Fall 05/05/2021   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[name])    Peripheral arterial disease/critical limb ischemia     GERD (gastroesophageal reflux disease)    Hyperlipemia    Hypertension    Hypokalemia    Hypomagnesemia    Hypothyroidism    Marijuana abuse    Narcotic abuse ([name])    Noncompliance    NSVT (nonsustained ventricular tachycardia) ([name])    Obesity    Osteomyelitis ([name]) 06/21/2019   PAF (paroxysmal atrial fibrillation) ([name])    Paroxysmal atrial tachycardia ([name])    Peripheral arterial disease ([name])    a. 01/2015 Angio/PTA: RSFA 99 (atherectomy/pta) - 1 vessel runoff via diff dzs peroneal; b. 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   Pneumonia    once or twice (11/26/2018)   Poorly controlled type 2 diabetes mellitus ([name])    Renal disorder    Renal insufficiency    a. 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   SIRS (systemic inflammatory response syndrome) ([name]) 04/06/2017   Sleep apnea    couldn't handle wearing the mask (11/26/2018)   Symptomatic bradycardia    a. Avoid AV blocking agent  per EP. Prev req temp wire in 2017.   Tobacco abuse    Wrist fracture, left, closed, initial encounter 01/29/2015   Past Surgical History:  Procedure Laterality Date   ABDOMINAL AORTOGRAM N/A 06/26/2019   Procedure: ABDOMINAL AORTOGRAM;  Surgeon: Darron Deatrice LABOR, MD;  Location: [facility];  Service: Cardiovascular;  Laterality: N/A;   ABDOMINAL AORTOGRAM W/LOWER EXTREMITY N/A 06/07/2021   Procedure: ABDOMINAL AORTOGRAM W/LOWER EXTREMITY;  Surgeon: Court Dorn PARAS, MD;  Location: [facility];  Service: Cardiovascular;  Laterality: N/A;   ABDOMINAL AORTOGRAM W/LOWER EXTREMITY N/A 08/11/2023   Procedure: ABDOMINAL AORTOGRAM W/LOWER EXTREMITY;  Surgeon: Magda Debby SAILOR, MD;  Location: [facility];  Service: Cardiovascular;  Laterality: N/A;   AMPUTATION Right 06/14/2017   Procedure: Right foot transmetatarsal amputation;  Surgeon: Harden Jerona GAILS, MD;  Location: [facility] OR;  Service: Orthopedics;  Laterality: Right;   AMPUTATION Left 06/28/2019   Procedure: AMPUTATION LEFT FIFTH TOE;  Surgeon: Oris Krystal FALCON, MD;  Location: [facility] OR;  Service: Vascular;  Laterality: Left;   AMPUTATION TOE Right 04/28/2017   Procedure: AMPUTATION OF RIGHT SECOND RAY;  Surgeon: Harden Jerona GAILS, MD;  Location: [facility] OR;  Service: Orthopedics;  Laterality: Right;   CARDIAC CATHETERIZATION     CARDIAC CATHETERIZATION N/A 01/12/2016   Procedure: Temporary Wire;  Surgeon: Lynwood Schilling, MD;  Location: [facility];  Service: Cardiovascular;  Laterality: N/A;   CARDIOVERSION  ~ 02/2013   twice    CARDIOVERSION N/A 09/26/2023   Procedure: CARDIOVERSION (CATH LAB);  Surgeon: Alvan Ronal BRAVO, MD;  Location[facility] INVASIVE CV LAB;  Service: Cardiovascular;  Laterality: N/A;   CAROTID ANGIOGRAM N/A 01/15/2015   Procedure: CAROTID ANGIOGRAM;  Surgeon: Dorn PARAS Court, MD;  Location: [facility] CATH LAB;  Service: Cardiovascular;  Laterality: N/A;   COLONOSCOPY N/A 03/14/2024   Procedure: COLONOSCOPY;  Surgeon: Albertus Gordy HERO, MD;  Location: [facility] ENDOSCOPY;  Service: Gastroenterology;  Laterality: N/A;   DILATION AND CURETTAGE OF UTERUS  1988   ENDARTERECTOMY Left 02/19/2015   Procedure: LEFT CAROTID ENDARTERECTOMY ;  Surgeon: Gaile LELON New, MD;  Location: [facility] OR;  Service: Vascular;  Laterality: Left;   ESOPHAGOGASTRODUODENOSCOPY N/A 03/13/2024   Procedure: EGD (ESOPHAGOGASTRODUODENOSCOPY);  Surgeon: Albertus Gordy HERO, MD;  Location: [facility] ENDOSCOPY;  Service: Gastroenterology;  Laterality: N/A;   FEMORAL-TIBIAL BYPASS GRAFT Left 06/28/2019   Procedure: BYPASS GRAFT LEFT LEG FEMORAL TO ANTERIOR TIBIAL ARTERY using LEFT GREATER SAPHENOUS VEIN;  Surgeon: Oris Krystal FALCON, MD;  Location: [facility] OR;  Service: Vascular;  Laterality: Left;   LEFT HEART CATHETERIZATION WITH CORONARY ANGIOGRAM N/A 10/31/2014   Procedure: LEFT HEART CATHETERIZATION WITH CORONARY ANGIOGRAM;  Surgeon: Lonni JONETTA Cash,  MD;  Location: [facility] CATH LAB;  Service: Cardiovascular;  Laterality: N/A;   LOWER EXTREMITY ANGIOGRAM N/A 09/10/2013   Procedure: LOWER EXTREMITY ANGIOGRAM;  Surgeon: Dorn PARAS Court, MD;  Location: [facility] CATH LAB;  Service: Cardiovascular;  Laterality: N/A;   LOWER EXTREMITY ANGIOGRAM N/A 01/15/2015   Procedure: LOWER EXTREMITY ANGIOGRAM;  Surgeon: Dorn PARAS Court, MD;  Location: [facility] CATH LAB;  Service: Cardiovascular;  Laterality: N/A;   LOWER EXTREMITY ANGIOGRAPHY N/A 04/13/2017   Procedure: Lower Extremity Angiography;  Surgeon: Court Dorn PARAS, MD;  Location: [facility] INVASIVE CV LAB;  Service: Cardiovascular;  Laterality: N/A;   LOWER EXTREMITY ANGIOGRAPHY Left 06/26/2019   Procedure: LOWER EXTREMITY ANGIOGRAPHY;  Surgeon: Darron Deatrice LABOR, MD;  Location: [facility];  Service: Cardiovascular;  Laterality:  Left;   PERIPHERAL VASCULAR ATHERECTOMY Right 06/07/2021   Procedure: PERIPHERAL VASCULAR ATHERECTOMY;  Surgeon: Court Dorn PARAS, MD;  Location: [facility] INVASIVE CV LAB;  Service: Cardiovascular;  Laterality: Right;   PERIPHERAL VASCULAR BALLOON ANGIOPLASTY Left 06/26/2019   Procedure: PERIPHERAL VASCULAR BALLOON ANGIOPLASTY;  Surgeon: Darron Deatrice LABOR, MD;  Location: [facility];  Service: Cardiovascular;  Laterality: Left;  unable to cross lt sfa occlusion   PERIPHERAL VASCULAR INTERVENTION  04/13/2017   Procedure: Peripheral Vascular Intervention;  Surgeon: Court Dorn PARAS, MD;  Location: [facility] INVASIVE CV LAB;  Service: Cardiovascular;;   PERIPHERAL VASCULAR INTERVENTION  08/11/2023   Procedure: PERIPHERAL VASCULAR INTERVENTION;  Surgeon: Magda Debby SAILOR, MD;  Location: [facility];  Service: Cardiovascular;;   PRESSURE SENSOR/CARDIOMEMS N/A 02/05/2020   Procedure: PRESSURE SENSOR/CARDIOMEMS;  Surgeon: Rolan Ezra RAMAN, MD;  Location: [facility] INVASIVE CV LAB;  Service: Cardiovascular;  Laterality: N/A;   VEIN HARVEST Left 06/28/2019   Procedure: LEFT LEG GREATER SAPHENOUS VEIN HARVEST;   Surgeon: Oris Krystal FALCON, MD;  Location: [facility] OR;  Service: Vascular;  Laterality: Left;   Patient Active Problem List   Diagnosis Date Noted   Persistent atrial fibrillation ([name]) 03/15/2024   Benign neoplasm of ascending colon 03/14/2024   Benign neoplasm of transverse colon 03/14/2024   Benign neoplasm of descending colon 03/14/2024   Benign neoplasm of sigmoid colon 03/14/2024   Chronic systolic congestive heart failure ([name]) 03/12/2024   Melena 03/12/2024   Heme positive stool 03/11/2024   GI bleed 03/08/2024   History of CAD (coronary artery disease) 03/08/2024   CKD (chronic kidney disease), stage IV ([name]) 03/08/2024   Continuous dependence on cigarette smoking 03/08/2024   GAD (generalized anxiety disorder) 03/08/2024   HFrEF (heart failure with reduced ejection fraction) ([name]) 10/11/2023   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) and diastolic (congestive) heart failure ([name]) 10/10/2023   Pre-ulcerative calluses 04/25/2022   PAF (paroxysmal atrial fibrillation) ([name]) 05/05/2021   COPD (chronic obstructive pulmonary disease) ([name]) 05/05/2021   OSA (obstructive sleep apnea) 05/05/2021   Stage 3b chronic kidney disease ([name]) 05/05/2021   Pressure injury of skin 05/04/2021   Diabetic nephropathy ([name]) 01/02/2021   Low back pain 06/01/2020   Hyperlipidemia 04/03/2020   Peripheral artery disease ([name]) 12/26/2019   NICM (nonischemic cardiomyopathy) ([name]) 06/20/2019   Non-healing ulcer ([name]) 06/20/2019   Coagulation disorder ([name]) 08/09/2017   Depression 07/21/2017   At risk for adverse drug reaction 06/20/2017   Peripheral neuropathy 06/20/2017   S/P transmetatarsal amputation of foot, right ([name]) 06/05/2017   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) 05/19/2017   Obesity, class 2 02/24/2016   Anticoagulation management encounter 02/10/2016   Chronic sinus bradycardia 01/12/2016   Essential hypertension 12/22/2015    Demand ischemia ([name])    Acute on chronic diastolic CHF (congestive heart failure) ([name])    Symptomatic anemia 11/08/2015   Hypokalemia 11/08/2015   Tobacco abuse 10/23/2015   Coronary artery disease    DOE (dyspnea on exertion) 04/29/2015   Paroxysmal atrial fibrillation ([name]) 01/16/2015   Carotid artery stenosis 01/16/2015   Insomnia 02/03/2014   S/P peripheral artery angioplasty - [place]bo[place] atherectomy; R SFA 09/11/2013    Class: Acute   Leg pain, bilateral 08/19/2013   Hypothyroidism 07/31/2013   History of cocaine abuse ([name]) 06/13/2013   Long term current use of anticoagulant therapy 05/20/2013   Alcohol abuse    Narcotic abuse ([name])    Marijuana abuse  Alcoholic cirrhosis ([name])    Insulin  dependent type 2 diabetes mellitus ([name])     PCP: Campbell Reynolds NP  REFERRING PROVIDER: Magda Ned MD  REFERRING DIAG: weakness and deconditioning  THERAPY DIAG:  Difficulty in walking, not elsewhere classified  Decreased functional mobility and endurance  Rationale for Evaluation and Treatment: Rehabilitation  ONSET DATE: chronic   SUBJECTIVE:   SUBJECTIVE STATEMENT: I am better today just the knee.    EVAL: Pt here to work on general mobility following vascular surgery.  Today is the first day she is using her new walker (Rollator).  She needs to go back to the foot doctor for new shoe.  She is difficulty walking due to recent surgeries and deconditioning.  She fell last year because her foot could not there was delayed healing .   2024 she had Stent Rt LE , Bypass LLE.     She does not do much walking. Walks to BR and the picnic table and back.   PERTINENT HISTORY: Neuropathy severe peripheral arterial disease  29 old female with a history of CHF, diabetes, CAD, LE swelling , Rt and Lt. Foot amputations    PAIN:  Are you having pain? Yes: NPRS scale: 7 Pain location: knees Pain description: Sore  Aggravating factors: walking  Relieving factors: sitting    PRECAUTIONS: Fallweakness, Rt knee   RED FLAGS: None   WEIGHT BEARING RESTRICTIONS: No  FALLS:  Has patient fallen in last 6 months? Yes. Number of falls 3  LIVING ENVIRONMENT: Lives with: lives with their family Lives in: House/apartment Stairs: No Has following equipment at home: Walker - 4 wheeledcane, shower lift   OCCUPATION: not working  PLOF: Independent with basic ADLs, Requires assistive device for independence, Needs assistance with homemaking, and Leisure: mostly sedentary , games on her phone   PATIENT GOALS: I want to be able to walk more   NEXT MD VISIT: as needed   OBJECTIVE:  Note: Objective measures were completed at Evaluation unless otherwise noted.  DIAGNOSTIC FINDINGS: none   PATIENT SURVEYS:  FABQ: Fear-Avoidance Beliefs Questionnaire (FABQ)  Date tested 05/24/24  My pain was caused by physical activity   6   Completely Agree  2. Physical activity makes my pain worse 6   Completely Agree  3. Physical activity might harm my back 0    Completely disagree  4. I should not do physical activities which (might) make my pain worse 6   Completely Agree  5. I cannot do physical activities which (might) make my pain worse 5  6. My pain was caused by work or by an accident at work 0    Completely disagree  7. My work aggravated my pain 0    Completely disagree  8. I have a claim for compensation for my pain 0    Completely disagree  9. My work is too heavy for me 4  10. My work makes or would make my pain worse 4  11. My work might harm my back 4  12. I should not do my normal work with my present pain 6   Completely Agree  13. I cannot do my normal work with my present pain 6   Completely Agree  14. I cannot do my normal work until my pain is treated 0    Completely disagree  15. I do not think that I will be back to my normal work within 3 months 0    Completely disagree  16. I  do not think that I will ever be able to go back to that work 5  Total  score  52   Scoring Scale 1: fear-avoidance beliefs about work - items 6,7,9,10,11,12,15 or 16. Add responses, divide by  Scale 2: fear-avoidance beliefs about physical activity - items 2,3,4,5   COGNITION: Overall cognitive status: Within functional limits for tasks assessed     SENSATION: neuropathy  EDEMA:  Swelling present in bilateral lower extremities not triggered   POSTURE: rounded shoulders, forward head, and increased thoracic kyphosis  LOWER EXTREMITY ROM:WFL  LOWER EXTREMITY MMT:  MMT Right eval Left eval  Hip flexion 5 5  Hip extension    Hip abduction    Hip adduction    Hip internal rotation    Hip external rotation    Knee flexion 5 4+   Knee extension 5 5  Ankle dorsiflexion    Ankle plantarflexion    Ankle inversion    Ankle eversion     (Blank rows = not tested)   FUNCTIONAL TESTS:  5 times sit to stand: 15 sec   GAIT: Distance walked: 161 Assistive device utilized: Environmental consultant - 4 wheeled Level of assistance: SBA Comments: needs 1 standing rest break O2 saturation 98% heart rate 96                                                                                                                                TREATMENT DATE:  [facility] Adult PT Treatment:                                                DATE: 06/13/24 Therapeutic Activity: Nustep L5 x 2 min, L3 x 3 min LAQ 3# 10 x 2  March standing 10 x 2  Standing heel/toe raises -min ROM Seated heel/ toe raise  Seated calf stretch with towel Seated h/s stretch- foot on rollator  STS x 10 - rolator in front  HL Ball squeeze x 10  bridge x 10- increases back pain  Supine SLR x 5 each , fatigues  Side clams x 12 each , AROM Gait 109 feet -using clinic RW Gait 76 feet - using clinic RW   [facility] Adult PT Treatment:                                                DATE: 06/11/24 Therapeutic Exercise: LAQ 2# 10 x 2  March 2# 10 x 2  STS x 10 - rolator in front  Nustep L4 x 3 min, L3 x 2 min Seated  heel and toe raises Supine bridge  Supine march Side clams x 10 each , AROM  [facility] Adult PT Treatment:                                                DATE: 05/24/24 TTherapeutic Activity: Sit to stand using rollator walker standing  alternating march Standing hip abduction  Long arc quads 2-minute walk test  Self Care: Plan of care ,expectations, home exercise program, endurance, and attendance policy   PATIENT EDUCATION:  Education details: See above Person educated: Patient Education method: Programmer, multimedia, Demonstration, Verbal cues, and Handouts Education comprehension: verbalized understanding, returned demonstration, and needs further education  HOME EXERCISE PROGRAM: Access Code: [email or web address] URL: [email or web address] Date: 05/24/2024 Prepared by: Delon Norma  Exercises - Sit to Stand with Arm Reach Toward Target  - 1-3 x daily - 7 x weekly - 2-3 sets - 10 reps - 5 hold - Seated Long Arc Quad  - 1-3 x daily - 7 x weekly - 2 sets - 10 reps - 5 hold - Standing March with Counter Support  - 1-3 x daily - 7 x weekly - 2 sets - 10 reps  ASSESSMENT:  CLINICAL IMPRESSION: Less pain today, knee most bothersome. Continued with increased reps, resistance to challenges in previous session. Rollator raised one level. Pt c/o not feeling steady with her rolator. Recommended she return to her standard RW if she feels this way. Used clinic for ambulation to  work on activity tolerance. She became fatigues and with leg pain after 109 feet.  Will plan to assess and progress as tolerated to increased functional ability.   EVAL: Patient is a 62y.o. female who was seen today for physical therapy evaluation and treatment for deconditioning and difficulty walking due to previous vascular surgery in 2024.  Since that time the patient has not been able to walk as she was previously.  She is limited by options for foot where her right midfoot amputation poses a fall risk due to  improper shoe she plans to get new shoes very soon.  Vital signs were stable during her walk but she does tire quickly.  Patient was given a basic home exercise program and encouraged to take multiple breaks throughout the day from sitting in her recliner to stand do some light walking and improve circulation to her lower body.  OBJECTIVE IMPAIRMENTS: Abnormal gait, cardiopulmonary status limiting activity, decreased activity tolerance, decreased balance, decreased cognition, decreased mobility, difficulty walking, decreased strength, increased edema, increased fascial restrictions, obesity, and pain.   ACTIVITY LIMITATIONS: carrying, lifting, standing, sleeping, stairs, transfers, and locomotion level  PARTICIPATION LIMITATIONS: meal prep, cleaning, laundry, interpersonal relationship, driving, shopping, and community activity  PERSONAL FACTORS: Social background, Time since onset of injury/illness/exacerbation, and 3+ comorbidities: High risk for major lower extremity amputation, congestive heart failure, diabetes, smoking are also affecting patient's functional outcome.   REHAB POTENTIAL: Fair see above comorbidities  CLINICAL DECISION MAKING: Stable/uncomplicated  EVALUATION COMPLEXITY: Low   GOALS: Goals reviewed with patient? Yes  LONG TERM GOALS: Target date: 07/19/2024   Patient will be able to improve 2 min walk test distance by 50 feet or more with LRAD and no increase in pain.   Baseline: 161 with 1 standing rest break  Goal status: INITIAL  2.  Patient will be independent with final HEP upon discharge from PT and report consistent benefit following exercise completion.    Baseline:  Goal status: INITIAL  3.  Pt will be able to demonstrate good standing balance as evidenced by balance recovery from min dynamic balance challenges.   Baseline:  Goal status: INITIAL  4.  Patient will tolerate 15 minutes of cardio to work toward improved activity tolerance  Baseline:  Goal  status: INITIAL  5.  Patient will be able to negotiate obstacles with her walker including curbs and inclines/declines with safety.  Baseline:  Goal status: INITIAL  PLAN: Feel like that PT FREQUENCY: 2x/week  PT DURATION: 6 weeks  PLANNED INTERVENTIONS: 97164- PT Re-evaluation, 97750- Physical Performance Testing, 97110-Therapeutic exercises, 97530- Therapeutic activity, 97112- Neuromuscular re-education, 97535- Self Care, [id number]- Manual therapy, [phone]- Gait training, Patient/Family education, Balance training, and DME instructions  PLAN FOR NEXT SESSION: NuStep, review home exercise program get into semireclined position consider adding straight leg raises bridges and outer hip strength balance as tolerated encouraged patient to go to the doctor and get new shoe   Harlene Persons, PTA 06/13/24 1:04 PM Phone: [phone] Fax: [phone]

## 2024-06-17 ENCOUNTER — Ambulatory Visit
Admission: RE | Admit: 2024-06-17 | Discharge: 2024-06-17 | Disposition: A | Discharge status: Home / Self Care | Source: Ambulatory Visit | Attending: Acute Care | Admitting: Acute Care

## 2024-06-17 DIAGNOSIS — Z87891 Personal history of nicotine dependence: Secondary | ICD-10-CM

## 2024-06-17 DIAGNOSIS — F17210 Nicotine dependence, cigarettes, uncomplicated: Secondary | ICD-10-CM

## 2024-06-17 DIAGNOSIS — Z122 Encounter for screening for malignant neoplasm of respiratory organs: Secondary | ICD-10-CM

## 2024-06-17 NOTE — Therapy (Deleted)
 OUTPATIENT PHYSICAL THERAPY LOWER EXTREMITY TREATMENT    Patient Name: [name] MRN: [id number] DOB:[birth year], 63 y.o., female Today's Date: 06/17/2024  END OF SESSION:    Past Medical History:  Diagnosis Date   Acute renal failure ([name])    Acute respiratory failure with hypoxia ([name]) 12/02/2019   Alcohol abuse    Alcoholic cirrhosis ([name])    Anemia    Anxiety    Arthritis    knees (11/26/2018)   B12 deficiency    CAD (coronary artery disease)    a. 11/10/2014 Cath: LM nl, LAD min irregs, D1 30 ost, D2 50d, LCX 62m, OM1 80 p/m (1.5 mm vessel), OM2 8m, RCA nondom 39m-->med rx.. Demand ischemia in the setting of rapid a-fib.   Cardiomyopathy ([name])    Carotid artery disease ([name])    a. 01/2015 Carotid Angio: RICA 100, LICA 95p; b. 02/2015 s/p L CEA; c. 05/2019 Carotid U/S: RICA 100. RECA >50. LICA 1-39%.   Cellulitis    lower extremities   Cellulitis in diabetic foot ([name]) 07/08/2019   CHF (congestive heart failure) ([name])    Chronic combined systolic and diastolic CHF (congestive heart failure) ([name])    a. 10/2014 Echo: EF 40-45%; b. 10/2018 Echo: EF 45-50%, gr2 DD; c. 11/2019 Echo: EF 50%, mild LVH, gr2 DD (restrictive), antlat HK, Nl RV fxn. Mild BAE. RVSP 59.107mmHg.   CKD (chronic kidney disease), stage III ([name])    Cocaine abuse ([name])    COPD (chronic obstructive pulmonary disease) ([name])    Critical ischemia of foot ([name]) 06/07/2021   Critical limb ischemia     Critical lower limb ischemia ([name])    Demand ischemia ([name]) 10/29/2014   Depression    Diabetes mellitus without complication ([name])    Diabetic peripheral neuropathy ([name])    DVT (deep venous thrombosis) ([name])    Dyspnea    Elevated troponin    a. Chronic elevation.   Fall 05/05/2021   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[name])    Peripheral arterial disease/critical limb ischemia     GERD (gastroesophageal reflux disease)    Hyperlipemia    Hypertension    Hypokalemia    Hypomagnesemia    Hypothyroidism     Marijuana abuse    Narcotic abuse ([name])    Noncompliance    NSVT (nonsustained ventricular tachycardia) ([name])    Obesity    Osteomyelitis ([name]) 06/21/2019   PAF (paroxysmal atrial fibrillation) ([name])    Paroxysmal atrial tachycardia ([name])    Peripheral arterial disease ([name])    a. 01/2015 Angio/PTA: RSFA 99 (atherectomy/pta) - 1 vessel runoff via diff dzs peroneal; b. 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   Pneumonia    once or twice (11/26/2018)   Poorly controlled type 2 diabetes mellitus ([name])    Renal disorder    Renal insufficiency    a. 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   SIRS (systemic inflammatory response syndrome) ([name]) 04/06/2017   Sleep apnea    couldn't handle wearing the mask (11/26/2018)   Symptomatic bradycardia    a. Avoid AV blocking agent per EP. Prev req temp wire in 2017.   Tobacco abuse    Wrist fracture, left, closed, initial encounter 01/29/2015   Past Surgical History:  Procedure Laterality Date   ABDOMINAL AORTOGRAM N/A 06/26/2019   Procedure: ABDOMINAL AORTOGRAM;  Surgeon: Darron Deatrice LABOR, MD;  Location: [facility] INVASIVE CV LAB;  Service: Cardiovascular;  Laterality: N/A;   ABDOMINAL AORTOGRAM W/LOWER EXTREMITY N/A  06/07/2021   Procedure: ABDOMINAL AORTOGRAM W/LOWER EXTREMITY;  Surgeon: Court Dorn PARAS, MD;  Location: [facility] INVASIVE CV LAB;  Service: Cardiovascular;  Laterality: N/A;   ABDOMINAL AORTOGRAM W/LOWER EXTREMITY N/A 08/11/2023   Procedure: ABDOMINAL AORTOGRAM W/LOWER EXTREMITY;  Surgeon: Magda Debby SAILOR, MD;  Location: [facility] INVASIVE CV LAB;  Service: Cardiovascular;  Laterality: N/A;   AMPUTATION Right 06/14/2017   Procedure: Right foot transmetatarsal amputation;  Surgeon: Harden Jerona GAILS, MD;  Location: [facility] OR;  Service: Orthopedics;  Laterality: Right;   AMPUTATION Left 06/28/2019   Procedure: AMPUTATION LEFT FIFTH TOE;  Surgeon: Oris Krystal FALCON, MD;  Location: [facility] OR;  Service: Vascular;  Laterality: Left;   AMPUTATION TOE Right 04/28/2017    Procedure: AMPUTATION OF RIGHT SECOND RAY;  Surgeon: Harden Jerona GAILS, MD;  Location: [facility] OR;  Service: Orthopedics;  Laterality: Right;   CARDIAC CATHETERIZATION     CARDIAC CATHETERIZATION N/A 01/12/2016   Procedure: Temporary Wire;  Surgeon: Lynwood Schilling, MD;  Location: [facility] INVASIVE CV LAB;  Service: Cardiovascular;  Laterality: N/A;   CARDIOVERSION  ~ 02/2013   twice    CARDIOVERSION N/A 09/26/2023   Procedure: CARDIOVERSION (CATH LAB);  Surgeon: Alvan Ronal BRAVO, MD;  Location: [facility] INVASIVE CV LAB;  Service: Cardiovascular;  Laterality: N/A;   CAROTID ANGIOGRAM N/A 01/15/2015   Procedure: CAROTID ANGIOGRAM;  Surgeon: Dorn PARAS Court, MD;  Location: [facility] CATH LAB;  Service: Cardiovascular;  Laterality: N/A;   COLONOSCOPY N/A 03/14/2024   Procedure: COLONOSCOPY;  Surgeon: Albertus Gordy HERO, MD;  Location: [facility] ENDOSCOPY;  Service: Gastroenterology;  Laterality: N/A;   DILATION AND CURETTAGE OF UTERUS  1988   ENDARTERECTOMY Left 02/19/2015   Procedure: LEFT CAROTID ENDARTERECTOMY ;  Surgeon: Gaile LELON New, MD;  Location: [facility] OR;  Service: Vascular;  Laterality: Left;   ESOPHAGOGASTRODUODENOSCOPY N/A 03/13/2024   Procedure: EGD (ESOPHAGOGASTRODUODENOSCOPY);  Surgeon: Albertus Gordy HERO, MD;  Location: [facility] ENDOSCOPY;  Service: Gastroenterology;  Laterality: N/A;   FEMORAL-TIBIAL BYPASS GRAFT Left 06/28/2019   Procedure: BYPASS GRAFT LEFT LEG FEMORAL TO ANTERIOR TIBIAL ARTERY using LEFT GREATER SAPHENOUS VEIN;  Surgeon: Oris Krystal FALCON, MD;  Location: [facility] OR;  Service: Vascular;  Laterality: Left;   LEFT HEART CATHETERIZATION WITH CORONARY ANGIOGRAM N/A 10/31/2014   Procedure: LEFT HEART CATHETERIZATION WITH CORONARY ANGIOGRAM;  Surgeon: Lonni JONETTA Cash, MD;  Location: [facility] CATH LAB;  Service: Cardiovascular;  Laterality: N/A;   LOWER EXTREMITY ANGIOGRAM N/A 09/10/2013   Procedure: LOWER EXTREMITY ANGIOGRAM;  Surgeon: Dorn PARAS Court, MD;  Location: [facility] CATH LAB;  Service: Cardiovascular;  Laterality: N/A;   LOWER  EXTREMITY ANGIOGRAM N/A 01/15/2015   Procedure: LOWER EXTREMITY ANGIOGRAM;  Surgeon: Dorn PARAS Court, MD;  Location: [facility] CATH LAB;  Service: Cardiovascular;  Laterality: N/A;   LOWER EXTREMITY ANGIOGRAPHY N/A 04/13/2017   Procedure: Lower Extremity Angiography;  Surgeon: Court Dorn PARAS, MD;  Location: [facility] INVASIVE CV LAB;  Service: Cardiovascular;  Laterality: N/A;   LOWER EXTREMITY ANGIOGRAPHY Left 06/26/2019   Procedure: LOWER EXTREMITY ANGIOGRAPHY;  Surgeon: Darron Deatrice LABOR, MD;  Location: [facility] INVASIVE CV LAB;  Service: Cardiovascular;  Laterality: Left;   PERIPHERAL VASCULAR ATHERECTOMY Right 06/07/2021   Procedure: PERIPHERAL VASCULAR ATHERECTOMY;  Surgeon: Court Dorn PARAS, MD;  Location: [facility] INVASIVE CV LAB;  Service: Cardiovascular;  Laterality: Right;   PERIPHERAL VASCULAR BALLOON ANGIOPLASTY Left 06/26/2019   Procedure: PERIPHERAL VASCULAR BALLOON ANGIOPLASTY;  Surgeon: Darron Deatrice LABOR, MD;  Location: [facility] INVASIVE CV LAB;  Service: Cardiovascular;  Laterality: Left;  unable  to cross lt sfa occlusion   PERIPHERAL VASCULAR INTERVENTION  04/13/2017   Procedure: Peripheral Vascular Intervention;  Surgeon: Court Dorn PARAS, MD;  Location: [facility] INVASIVE CV LAB;  Service: Cardiovascular;;   PERIPHERAL VASCULAR INTERVENTION  08/11/2023   Procedure: PERIPHERAL VASCULAR INTERVENTION;  Surgeon: Magda Debby SAILOR, MD;  Location: [facility] INVASIVE CV LAB;  Service: Cardiovascular;;   PRESSURE SENSOR/CARDIOMEMS N/A 02/05/2020   Procedure: PRESSURE SENSOR/CARDIOMEMS;  Surgeon: Rolan Ezra RAMAN, MD;  Location: [facility] INVASIVE CV LAB;  Service: Cardiovascular;  Laterality: N/A;   VEIN HARVEST Left 06/28/2019   Procedure: LEFT LEG GREATER SAPHENOUS VEIN HARVEST;  Surgeon: Oris Krystal FALCON, MD;  Location: [facility] OR;  Service: Vascular;  Laterality: Left;   Patient Active Problem List   Diagnosis Date Noted   Persistent atrial fibrillation ([name]) 03/15/2024   Benign neoplasm of ascending colon 03/14/2024   Benign neoplasm of  transverse colon 03/14/2024   Benign neoplasm of descending colon 03/14/2024   Benign neoplasm of sigmoid colon 03/14/2024   Chronic systolic congestive heart failure ([name]) 03/12/2024   Melena 03/12/2024   Heme positive stool 03/11/2024   GI bleed 03/08/2024   History of CAD (coronary artery disease) 03/08/2024   CKD (chronic kidney disease), stage IV ([name]) 03/08/2024   Continuous dependence on cigarette smoking 03/08/2024   GAD (generalized anxiety disorder) 03/08/2024   HFrEF (heart failure with reduced ejection fraction) ([name]) 10/11/2023   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) and diastolic (congestive) heart failure ([name]) 10/10/2023   Pre-ulcerative calluses 04/25/2022   PAF (paroxysmal atrial fibrillation) ([name]) 05/05/2021   COPD (chronic obstructive pulmonary disease) ([name]) 05/05/2021   OSA (obstructive sleep apnea) 05/05/2021   Stage 3b chronic kidney disease ([name]) 05/05/2021   Pressure injury of skin 05/04/2021   Diabetic nephropathy ([name]) 01/02/2021   Low back pain 06/01/2020   Hyperlipidemia 04/03/2020   Peripheral artery disease ([name]) 12/26/2019   NICM (nonischemic cardiomyopathy) ([name]) 06/20/2019   Non-healing ulcer ([name]) 06/20/2019   Coagulation disorder ([name]) 08/09/2017   Depression 07/21/2017   At risk for adverse drug reaction 06/20/2017   Peripheral neuropathy 06/20/2017   S/P transmetatarsal amputation of foot, right ([name]) 06/05/2017   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) 05/19/2017   Obesity, class 2 02/24/2016   Anticoagulation management encounter 02/10/2016   Chronic sinus bradycardia 01/12/2016   Essential hypertension 12/22/2015   Demand ischemia ([name])    Acute on chronic diastolic CHF (congestive heart failure) ([name])    Symptomatic anemia 11/08/2015   Hypokalemia 11/08/2015   Tobacco abuse 10/23/2015   Coronary artery disease    DOE (dyspnea on exertion) 04/29/2015   Paroxysmal  atrial fibrillation ([name]) 01/16/2015   Carotid artery stenosis 01/16/2015   Insomnia 02/03/2014   S/P peripheral artery angioplasty - [employer]bo[employer] atherectomy; R SFA 09/11/2013    Class: Acute   Leg pain, bilateral 08/19/2013   Hypothyroidism 07/31/2013   History of cocaine abuse ([name]) 06/13/2013   Long term current use of anticoagulant therapy 05/20/2013   Alcohol abuse    Narcotic abuse ([name])    Marijuana abuse    Alcoholic cirrhosis ([name])    Insulin  dependent type 2 diabetes mellitus ([name])     PCP: Campbell Reynolds NP  REFERRING PROVIDER: Magda Debby MD  REFERRING DIAG: weakness and deconditioning  THERAPY DIAG:  No diagnosis found.  Rationale for Evaluation and Treatment: Rehabilitation  ONSET DATE: chronic   SUBJECTIVE:   SUBJECTIVE STATEMENT: I am better today just the  knee.    EVAL: Pt here to work on general mobility following vascular surgery.  Today is the first day she is using her new walker (Rollator).  She needs to go back to the foot doctor for new shoe.  She is difficulty walking due to recent surgeries and deconditioning.  She fell last year because her foot could not there was delayed healing .   2024 she had Stent Rt LE , Bypass LLE.     She does not do much walking. Walks to BR and the picnic table and back.   PERTINENT HISTORY: Neuropathy severe peripheral arterial disease  60 old female with a history of CHF, diabetes, CAD, LE swelling , Rt and Lt. Foot amputations    PAIN:  Are you having pain? Yes: NPRS scale: 7 Pain location: knees Pain description: Sore  Aggravating factors: walking  Relieving factors: sitting   PRECAUTIONS: Fallweakness, Rt knee   RED FLAGS: None   WEIGHT BEARING RESTRICTIONS: No  FALLS:  Has patient fallen in last 6 months? Yes. Number of falls 3  LIVING ENVIRONMENT: Lives with: lives with their family Lives in: House/apartment Stairs: No Has following equipment at home: Walker - 4 wheeledcane, shower lift    OCCUPATION: not working  PLOF: Independent with basic ADLs, Requires assistive device for independence, Needs assistance with homemaking, and Leisure: mostly sedentary , games on her phone   PATIENT GOALS: I want to be able to walk more   NEXT MD VISIT: as needed   OBJECTIVE:  Note: Objective measures were completed at Evaluation unless otherwise noted.  DIAGNOSTIC FINDINGS: none   PATIENT SURVEYS:  FABQ: Fear-Avoidance Beliefs Questionnaire (FABQ)  Date tested 05/24/24  My pain was caused by physical activity   6   Completely Agree  2. Physical activity makes my pain worse 6   Completely Agree  3. Physical activity might harm my back 0    Completely disagree  4. I should not do physical activities which (might) make my pain worse 6   Completely Agree  5. I cannot do physical activities which (might) make my pain worse 5  6. My pain was caused by work or by an accident at work 0    Completely disagree  7. My work aggravated my pain 0    Completely disagree  8. I have a claim for compensation for my pain 0    Completely disagree  9. My work is too heavy for me 4  10. My work makes or would make my pain worse 4  11. My work might harm my back 4  12. I should not do my normal work with my present pain 6   Completely Agree  13. I cannot do my normal work with my present pain 6   Completely Agree  14. I cannot do my normal work until my pain is treated 0    Completely disagree  15. I do not think that I will be back to my normal work within 3 months 0    Completely disagree  16. I do not think that I will ever be able to go back to that work 5  Total score  52   Scoring Scale 1: fear-avoidance beliefs about work - items 6,7,9,10,11,12,15 or 16. Add responses, divide by  Scale 2: fear-avoidance beliefs about physical activity - items 2,3,4,5   COGNITION: Overall cognitive status: Within functional limits for tasks assessed     SENSATION: neuropathy  EDEMA:  Swelling  present in bilateral lower extremities not triggered   POSTURE: rounded shoulders, forward head, and increased thoracic kyphosis  LOWER EXTREMITY ROM:WFL  LOWER EXTREMITY MMT:  MMT Right eval Left eval  Hip flexion 5 5  Hip extension    Hip abduction    Hip adduction    Hip internal rotation    Hip external rotation    Knee flexion 5 4+   Knee extension 5 5  Ankle dorsiflexion    Ankle plantarflexion    Ankle inversion    Ankle eversion     (Blank rows = not tested)   FUNCTIONAL TESTS:  5 times sit to stand: 15 sec   GAIT: Distance walked: 161 Assistive device utilized: Environmental consultant - 4 wheeled Level of assistance: SBA Comments: needs 1 standing rest break O2 saturation 98% heart rate 96                                                                                                                                TREATMENT DATE:   [facility] Adult PT Treatment:                                                DATE: 06/18/24 Therapeutic Exercise: *** Manual Therapy: *** Neuromuscular re-ed: *** Therapeutic Activity: *** Modalities: *** Self Care: ***  [facility] Adult PT Treatment:                                                DATE: 06/13/24 Therapeutic Activity: Nustep L5 x 2 min, L3 x 3 min LAQ 3# 10 x 2  March standing 10 x 2  Standing heel/toe raises -min ROM Seated heel/ toe raise  Seated calf stretch with towel Seated h/s stretch- foot on rollator  STS x 10 - rolator in front  HL Ball squeeze x 10  bridge x 10- increases back pain  Supine SLR x 5 each , fatigues  Side clams x 12 each , AROM Gait 109 feet -using clinic RW Gait 76 feet - using clinic RW   [facility] Adult PT Treatment:                                                DATE: 06/11/24 Therapeutic Exercise: LAQ 2# 10 x 2  March 2# 10 x 2  STS x 10 - rolator in front  Nustep L4 x 3 min, L3 x 2 min Seated heel and toe raises Supine bridge  Supine march Side clams x 10 each , AROM    [facility] Adult  PT  Treatment:                                                DATE: 05/24/24 TTherapeutic Activity: Sit to stand using rollator walker standing  alternating march Standing hip abduction  Long arc quads 2-minute walk test  Self Care: Plan of care ,expectations, home exercise program, endurance, and attendance policy   PATIENT EDUCATION:  Education details: See above Person educated: Patient Education method: Programmer, multimedia, Demonstration, Verbal cues, and Handouts Education comprehension: verbalized understanding, returned demonstration, and needs further education  HOME EXERCISE PROGRAM: Access Code: [email or web address] URL: [email or web address] Date: 05/24/2024 Prepared by: Delon Norma  Exercises - Sit to Stand with Arm Reach Toward Target  - 1-3 x daily - 7 x weekly - 2-3 sets - 10 reps - 5 hold - Seated Long Arc Quad  - 1-3 x daily - 7 x weekly - 2 sets - 10 reps - 5 hold - Standing March with Counter Support  - 1-3 x daily - 7 x weekly - 2 sets - 10 reps  ASSESSMENT:  CLINICAL IMPRESSION: Less pain today, knee most bothersome. Continued with increased reps, resistance to challenges in previous session. Rollator raised one level. Pt c/o not feeling steady with her rolator. Recommended she return to her standard RW if she feels this way. Used clinic for ambulation to  work on activity tolerance. She became fatigues and with leg pain after 109 feet.  Will plan to assess and progress as tolerated to increased functional ability.   EVAL: Patient is a 62y.o. female who was seen today for physical therapy evaluation and treatment for deconditioning and difficulty walking due to previous vascular surgery in 2024.  Since that time the patient has not been able to walk as she was previously.  She is limited by options for foot where her right midfoot amputation poses a fall risk due to improper shoe she plans to get new shoes very soon.  Vital signs were stable during her walk but she does  tire quickly.  Patient was given a basic home exercise program and encouraged to take multiple breaks throughout the day from sitting in her recliner to stand do some light walking and improve circulation to her lower body.  OBJECTIVE IMPAIRMENTS: Abnormal gait, cardiopulmonary status limiting activity, decreased activity tolerance, decreased balance, decreased cognition, decreased mobility, difficulty walking, decreased strength, increased edema, increased fascial restrictions, obesity, and pain.   ACTIVITY LIMITATIONS: carrying, lifting, standing, sleeping, stairs, transfers, and locomotion level  PARTICIPATION LIMITATIONS: meal prep, cleaning, laundry, interpersonal relationship, driving, shopping, and community activity  PERSONAL FACTORS: Social background, Time since onset of injury/illness/exacerbation, and 3+ comorbidities: High risk for major lower extremity amputation, congestive heart failure, diabetes, smoking are also affecting patient's functional outcome.   REHAB POTENTIAL: Fair see above comorbidities  CLINICAL DECISION MAKING: Stable/uncomplicated  EVALUATION COMPLEXITY: Low   GOALS: Goals reviewed with patient? Yes  LONG TERM GOALS: Target date: 07/19/2024   Patient will be able to improve 2 min walk test distance by 50 feet or more with LRAD and no increase in pain.   Baseline: 161 with 1 standing rest break  Goal status: INITIAL  2.  Patient will be independent with final HEP upon discharge from PT and report consistent benefit following exercise completion.    Baseline:  Goal status: INITIAL  3.  Pt  will be able to demonstrate good standing balance as evidenced by balance recovery from min dynamic balance challenges.   Baseline:  Goal status: INITIAL  4.  Patient will tolerate 15 minutes of cardio to work toward improved activity tolerance  Baseline:  Goal status: INITIAL  5.  Patient will be able to negotiate obstacles with her walker including curbs and  inclines/declines with safety.  Baseline:  Goal status: INITIAL  PLAN: Feel like that PT FREQUENCY: 2x/week  PT DURATION: 6 weeks  PLANNED INTERVENTIONS: 97164- PT Re-evaluation, 97750- Physical Performance Testing, 97110-Therapeutic exercises, 97530- Therapeutic activity, 97112- Neuromuscular re-education, 97535- Self Care, [id number]- Manual therapy, [phone]- Gait training, Patient/Family education, Balance training, and DME instructions  PLAN FOR NEXT SESSION: NuStep, review home exercise program get into semireclined position consider adding straight leg raises bridges and outer hip strength balance as tolerated encouraged patient to go to the doctor and get new shoe   Harlene Persons, PTA 06/17/24 12:23 PM Phone: [phone] Fax: [phone]

## 2024-06-18 ENCOUNTER — Ambulatory Visit: Admitting: Podiatry

## 2024-06-18 ENCOUNTER — Ambulatory Visit: Admitting: Physical Therapy

## 2024-06-20 ENCOUNTER — Ambulatory Visit: Attending: Student | Admitting: Physical Therapy

## 2024-06-20 DIAGNOSIS — R262 Difficulty in walking, not elsewhere classified: Secondary | ICD-10-CM | POA: Insufficient documentation

## 2024-06-20 DIAGNOSIS — Z7409 Other reduced mobility: Secondary | ICD-10-CM | POA: Insufficient documentation

## 2024-06-20 NOTE — Telephone Encounter (Signed)
 Patient was called today regrding her issed appt at 12:30.  Left voicemail and asked her to call back to rescheduled if possible.  Also reminded her about the attendance policy. As well as her next appt for 8/12.  Delon Norma, PT 06/20/24 12:55 PM Phone: [phone] Fax: [phone]

## 2024-06-20 NOTE — Therapy (Deleted)
 OUTPATIENT PHYSICAL THERAPY LOWER EXTREMITY TREATMENT    Patient Name: [name] MRN: [id number] DOB:1961/03/06, 63 y.o., female Today's Date: 06/20/2024  END OF SESSION:    Past Medical History:  Diagnosis Date   Acute renal failure ([name])    Acute respiratory failure with hypoxia ([name]) 12/02/2019   Alcohol abuse    Alcoholic cirrhosis ([name])    Anemia    Anxiety    Arthritis    knees (11/26/2018)   B12 deficiency    CAD (coronary artery disease)    a. 11/10/2014 Cath: LM nl, LAD min irregs, D1 30 ost, D2 50d, LCX 85m, OM1 80 p/m (1.5 mm vessel), OM2 20m, RCA nondom 45m-->med rx.. Demand ischemia in the setting of rapid a-fib.   Cardiomyopathy ([name])    Carotid artery disease ([name])    a. 01/2015 Carotid Angio: RICA 100, LICA 95p; b. 02/2015 s/p L CEA; c. 05/2019 Carotid U/S: RICA 100. RECA >50. LICA 1-39%.   Cellulitis    lower extremities   Cellulitis in diabetic foot ([name]) 07/08/2019   CHF (congestive heart failure) ([name])    Chronic combined systolic and diastolic CHF (congestive heart failure) ([name])    a. 10/2014 Echo: EF 40-45%; b. 10/2018 Echo: EF 45-50%, gr2 DD; c. 11/2019 Echo: EF 50%, mild LVH, gr2 DD (restrictive), antlat HK, Nl RV fxn. Mild BAE. RVSP 59.110mmHg.   CKD (chronic kidney disease), stage III ([name])    Cocaine abuse ([name])    COPD (chronic obstructive pulmonary disease) ([name])    Critical ischemia of foot ([name]) 06/07/2021   Critical limb ischemia     Critical lower limb ischemia ([name])    Demand ischemia ([name]) 10/29/2014   Depression    Diabetes mellitus without complication ([name])    Diabetic peripheral neuropathy ([name])    DVT (deep venous thrombosis) ([name])    Dyspnea    Elevated troponin    a. Chronic elevation.   Fall 05/05/2021   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[name])    Peripheral arterial disease/critical limb ischemia     GERD (gastroesophageal reflux disease)    Hyperlipemia    Hypertension    Hypokalemia    Hypomagnesemia    Hypothyroidism     Marijuana abuse    Narcotic abuse ([name])    Noncompliance    NSVT (nonsustained ventricular tachycardia) ([name])    Obesity    Osteomyelitis ([name]) 06/21/2019   PAF (paroxysmal atrial fibrillation) ([name])    Paroxysmal atrial tachycardia ([name])    Peripheral arterial disease ([name])    a. 01/2015 Angio/PTA: RSFA 99 (atherectomy/pta) - 1 vessel runoff via diff dzs peroneal; b. 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   Pneumonia    once or twice (11/26/2018)   Poorly controlled type 2 diabetes mellitus ([name])    Renal disorder    Renal insufficiency    a. 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   SIRS (systemic inflammatory response syndrome) ([name]) 04/06/2017   Sleep apnea    couldn't handle wearing the mask (11/26/2018)   Symptomatic bradycardia    a. Avoid AV blocking agent per EP. Prev req temp wire in 2017.   Tobacco abuse    Wrist fracture, left, closed, initial encounter 01/29/2015   Past Surgical History:  Procedure Laterality Date   ABDOMINAL AORTOGRAM N/A 06/26/2019   Procedure: ABDOMINAL AORTOGRAM;  Surgeon: Darron Deatrice LABOR, MD;  Location: [facility] INVASIVE CV LAB;  Service: Cardiovascular;  Laterality: N/A;   ABDOMINAL AORTOGRAM W/LOWER EXTREMITY N/A  06/07/2021   Procedure: ABDOMINAL AORTOGRAM W/LOWER EXTREMITY;  Surgeon: Court Dorn PARAS, MD;  Location: [facility] INVASIVE CV LAB;  Service: Cardiovascular;  Laterality: N/A;   ABDOMINAL AORTOGRAM W/LOWER EXTREMITY N/A 08/11/2023   Procedure: ABDOMINAL AORTOGRAM W/LOWER EXTREMITY;  Surgeon: Magda Debby SAILOR, MD;  Location: [facility] INVASIVE CV LAB;  Service: Cardiovascular;  Laterality: N/A;   AMPUTATION Right 06/14/2017   Procedure: Right foot transmetatarsal amputation;  Surgeon: Harden Jerona GAILS, MD;  Location: [facility] OR;  Service: Orthopedics;  Laterality: Right;   AMPUTATION Left 06/28/2019   Procedure: AMPUTATION LEFT FIFTH TOE;  Surgeon: Oris Krystal FALCON, MD;  Location: [facility] OR;  Service: Vascular;  Laterality: Left;   AMPUTATION TOE Right 04/28/2017    Procedure: AMPUTATION OF RIGHT SECOND RAY;  Surgeon: Harden Jerona GAILS, MD;  Location: [facility] OR;  Service: Orthopedics;  Laterality: Right;   CARDIAC CATHETERIZATION     CARDIAC CATHETERIZATION N/A 01/12/2016   Procedure: Temporary Wire;  Surgeon: Lynwood Schilling, MD;  Location: [facility] INVASIVE CV LAB;  Service: Cardiovascular;  Laterality: N/A;   CARDIOVERSION  ~ 02/2013   twice    CARDIOVERSION N/A 09/26/2023   Procedure: CARDIOVERSION (CATH LAB);  Surgeon: Alvan Ronal BRAVO, MD;  Location: [facility] INVASIVE CV LAB;  Service: Cardiovascular;  Laterality: N/A;   CAROTID ANGIOGRAM N/A 01/15/2015   Procedure: CAROTID ANGIOGRAM;  Surgeon: Dorn PARAS Court, MD;  Location: [facility] CATH LAB;  Service: Cardiovascular;  Laterality: N/A;   COLONOSCOPY N/A 03/14/2024   Procedure: COLONOSCOPY;  Surgeon: Albertus Gordy HERO, MD;  Location: [facility] ENDOSCOPY;  Service: Gastroenterology;  Laterality: N/A;   DILATION AND CURETTAGE OF UTERUS  1988   ENDARTERECTOMY Left 02/19/2015   Procedure: LEFT CAROTID ENDARTERECTOMY ;  Surgeon: Gaile LELON New, MD;  Location: [facility] OR;  Service: Vascular;  Laterality: Left;   ESOPHAGOGASTRODUODENOSCOPY N/A 03/13/2024   Procedure: EGD (ESOPHAGOGASTRODUODENOSCOPY);  Surgeon: Albertus Gordy HERO, MD;  Location: [facility] ENDOSCOPY;  Service: Gastroenterology;  Laterality: N/A;   FEMORAL-TIBIAL BYPASS GRAFT Left 06/28/2019   Procedure: BYPASS GRAFT LEFT LEG FEMORAL TO ANTERIOR TIBIAL ARTERY using LEFT GREATER SAPHENOUS VEIN;  Surgeon: Oris Krystal FALCON, MD;  Location: [facility] OR;  Service: Vascular;  Laterality: Left;   LEFT HEART CATHETERIZATION WITH CORONARY ANGIOGRAM N/A 10/31/2014   Procedure: LEFT HEART CATHETERIZATION WITH CORONARY ANGIOGRAM;  Surgeon: Lonni JONETTA Cash, MD;  Location: [facility] CATH LAB;  Service: Cardiovascular;  Laterality: N/A;   LOWER EXTREMITY ANGIOGRAM N/A 09/10/2013   Procedure: LOWER EXTREMITY ANGIOGRAM;  Surgeon: Dorn PARAS Court, MD;  Location: [facility] CATH LAB;  Service: Cardiovascular;  Laterality: N/A;   LOWER  EXTREMITY ANGIOGRAM N/A 01/15/2015   Procedure: LOWER EXTREMITY ANGIOGRAM;  Surgeon: Dorn PARAS Court, MD;  Location: [facility] CATH LAB;  Service: Cardiovascular;  Laterality: N/A;   LOWER EXTREMITY ANGIOGRAPHY N/A 04/13/2017   Procedure: Lower Extremity Angiography;  Surgeon: Court Dorn PARAS, MD;  Location: [facility] INVASIVE CV LAB;  Service: Cardiovascular;  Laterality: N/A;   LOWER EXTREMITY ANGIOGRAPHY Left 06/26/2019   Procedure: LOWER EXTREMITY ANGIOGRAPHY;  Surgeon: Darron Deatrice LABOR, MD;  Location: [facility] INVASIVE CV LAB;  Service: Cardiovascular;  Laterality: Left;   PERIPHERAL VASCULAR ATHERECTOMY Right 06/07/2021   Procedure: PERIPHERAL VASCULAR ATHERECTOMY;  Surgeon: Court Dorn PARAS, MD;  Location: [facility] INVASIVE CV LAB;  Service: Cardiovascular;  Laterality: Right;   PERIPHERAL VASCULAR BALLOON ANGIOPLASTY Left 06/26/2019   Procedure: PERIPHERAL VASCULAR BALLOON ANGIOPLASTY;  Surgeon: Darron Deatrice LABOR, MD;  Location: [facility] INVASIVE CV LAB;  Service: Cardiovascular;  Laterality: Left;  unable  to cross lt sfa occlusion   PERIPHERAL VASCULAR INTERVENTION  04/13/2017   Procedure: Peripheral Vascular Intervention;  Surgeon: Court Dorn PARAS, MD;  Location: [facility] INVASIVE CV LAB;  Service: Cardiovascular;;   PERIPHERAL VASCULAR INTERVENTION  08/11/2023   Procedure: PERIPHERAL VASCULAR INTERVENTION;  Surgeon: Magda Debby SAILOR, MD;  Location: [facility] INVASIVE CV LAB;  Service: Cardiovascular;;   PRESSURE SENSOR/CARDIOMEMS N/A 02/05/2020   Procedure: PRESSURE SENSOR/CARDIOMEMS;  Surgeon: Rolan Ezra RAMAN, MD;  Location: [facility] INVASIVE CV LAB;  Service: Cardiovascular;  Laterality: N/A;   VEIN HARVEST Left 06/28/2019   Procedure: LEFT LEG GREATER SAPHENOUS VEIN HARVEST;  Surgeon: Oris Krystal FALCON, MD;  Location: [facility] OR;  Service: Vascular;  Laterality: Left;   Patient Active Problem List   Diagnosis Date Noted   Persistent atrial fibrillation ([name]) 03/15/2024   Benign neoplasm of ascending colon 03/14/2024   Benign neoplasm of  transverse colon 03/14/2024   Benign neoplasm of descending colon 03/14/2024   Benign neoplasm of sigmoid colon 03/14/2024   Chronic systolic congestive heart failure ([name]) 03/12/2024   Melena 03/12/2024   Heme positive stool 03/11/2024   GI bleed 03/08/2024   History of CAD (coronary artery disease) 03/08/2024   CKD (chronic kidney disease), stage IV ([name]) 03/08/2024   Continuous dependence on cigarette smoking 03/08/2024   GAD (generalized anxiety disorder) 03/08/2024   HFrEF (heart failure with reduced ejection fraction) ([name]) 10/11/2023   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) and diastolic (congestive) heart failure ([name]) 10/10/2023   Pre-ulcerative calluses 04/25/2022   PAF (paroxysmal atrial fibrillation) ([name]) 05/05/2021   COPD (chronic obstructive pulmonary disease) ([name]) 05/05/2021   OSA (obstructive sleep apnea) 05/05/2021   Stage 3b chronic kidney disease ([name]) 05/05/2021   Pressure injury of skin 05/04/2021   Diabetic nephropathy ([name]) 01/02/2021   Low back pain 06/01/2020   Hyperlipidemia 04/03/2020   Peripheral artery disease ([name]) 12/26/2019   NICM (nonischemic cardiomyopathy) ([name]) 06/20/2019   Non-healing ulcer ([name]) 06/20/2019   Coagulation disorder ([name]) 08/09/2017   Depression 07/21/2017   At risk for adverse drug reaction 06/20/2017   Peripheral neuropathy 06/20/2017   S/P transmetatarsal amputation of foot, right ([name]) 06/05/2017   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) 05/19/2017   Obesity, class 2 02/24/2016   Anticoagulation management encounter 02/10/2016   Chronic sinus bradycardia 01/12/2016   Essential hypertension 12/22/2015   Demand ischemia ([name])    Acute on chronic diastolic CHF (congestive heart failure) ([name])    Symptomatic anemia 11/08/2015   Hypokalemia 11/08/2015   Tobacco abuse 10/23/2015   Coronary artery disease    DOE (dyspnea on exertion) 04/29/2015   Paroxysmal  atrial fibrillation ([name]) 01/16/2015   Carotid artery stenosis 01/16/2015   Insomnia 02/03/2014   S/P peripheral artery angioplasty - [employer]bo[employer] atherectomy; R SFA 09/11/2013    Class: Acute   Leg pain, bilateral 08/19/2013   Hypothyroidism 07/31/2013   History of cocaine abuse ([name]) 06/13/2013   Long term current use of anticoagulant therapy 05/20/2013   Alcohol abuse    Narcotic abuse ([name])    Marijuana abuse    Alcoholic cirrhosis ([name])    Insulin  dependent type 2 diabetes mellitus ([name])     PCP: Campbell Reynolds NP  REFERRING PROVIDER: Magda Debby MD  REFERRING DIAG: weakness and deconditioning  THERAPY DIAG:  No diagnosis found.  Rationale for Evaluation and Treatment: Rehabilitation  ONSET DATE: chronic   SUBJECTIVE:   SUBJECTIVE STATEMENT: I am better today just the  knee.    EVAL: Pt here to work on general mobility following vascular surgery.  Today is the first day she is using her new walker (Rollator).  She needs to go back to the foot doctor for new shoe.  She is difficulty walking due to recent surgeries and deconditioning.  She fell last year because her foot could not there was delayed healing .   2024 she had Stent Rt LE , Bypass LLE.     She does not do much walking. Walks to BR and the picnic table and back.   PERTINENT HISTORY: Neuropathy severe peripheral arterial disease  38 old female with a history of CHF, diabetes, CAD, LE swelling , Rt and Lt. Foot amputations    PAIN:  Are you having pain? Yes: NPRS scale: 7 Pain location: knees Pain description: Sore  Aggravating factors: walking  Relieving factors: sitting   PRECAUTIONS: Fallweakness, Rt knee   RED FLAGS: None   WEIGHT BEARING RESTRICTIONS: No  FALLS:  Has patient fallen in last 6 months? Yes. Number of falls 3  LIVING ENVIRONMENT: Lives with: lives with their family Lives in: House/apartment Stairs: No Has following equipment at home: Walker - 4 wheeledcane, shower lift    OCCUPATION: not working  PLOF: Independent with basic ADLs, Requires assistive device for independence, Needs assistance with homemaking, and Leisure: mostly sedentary , games on her phone   PATIENT GOALS: I want to be able to walk more   NEXT MD VISIT: as needed   OBJECTIVE:  Note: Objective measures were completed at Evaluation unless otherwise noted.  DIAGNOSTIC FINDINGS: none   PATIENT SURVEYS:  FABQ: Fear-Avoidance Beliefs Questionnaire (FABQ)  Date tested 05/24/24  My pain was caused by physical activity   6   Completely Agree  2. Physical activity makes my pain worse 6   Completely Agree  3. Physical activity might harm my back 0    Completely disagree  4. I should not do physical activities which (might) make my pain worse 6   Completely Agree  5. I cannot do physical activities which (might) make my pain worse 5  6. My pain was caused by work or by an accident at work 0    Completely disagree  7. My work aggravated my pain 0    Completely disagree  8. I have a claim for compensation for my pain 0    Completely disagree  9. My work is too heavy for me 4  10. My work makes or would make my pain worse 4  11. My work might harm my back 4  12. I should not do my normal work with my present pain 6   Completely Agree  13. I cannot do my normal work with my present pain 6   Completely Agree  14. I cannot do my normal work until my pain is treated 0    Completely disagree  15. I do not think that I will be back to my normal work within 3 months 0    Completely disagree  16. I do not think that I will ever be able to go back to that work 5  Total score  52   Scoring Scale 1: fear-avoidance beliefs about work - items 6,7,9,10,11,12,15 or 16. Add responses, divide by  Scale 2: fear-avoidance beliefs about physical activity - items 2,3,4,5   COGNITION: Overall cognitive status: Within functional limits for tasks assessed     SENSATION: neuropathy  EDEMA:  Swelling  present in bilateral lower extremities not triggered   POSTURE: rounded shoulders, forward head, and increased thoracic kyphosis  LOWER EXTREMITY ROM:WFL  LOWER EXTREMITY MMT:  MMT Right eval Left eval  Hip flexion 5 5  Hip extension    Hip abduction    Hip adduction    Hip internal rotation    Hip external rotation    Knee flexion 5 4+   Knee extension 5 5  Ankle dorsiflexion    Ankle plantarflexion    Ankle inversion    Ankle eversion     (Blank rows = not tested)   FUNCTIONAL TESTS:  5 times sit to stand: 15 sec   GAIT: Distance walked: 161 Assistive device utilized: Environmental consultant - 4 wheeled Level of assistance: SBA Comments: needs 1 standing rest break O2 saturation 98% heart rate 96                                                                                                                                TREATMENT DATE:   [facility] Adult PT Treatment:                                                DATE: 06/18/24 Therapeutic Exercise: *** Manual Therapy: *** Neuromuscular re-ed: *** Therapeutic Activity: *** Modalities: *** Self Care: ***  [facility] Adult PT Treatment:                                                DATE: 06/13/24 Therapeutic Activity: Nustep L5 x 2 min, L3 x 3 min LAQ 3# 10 x 2  March standing 10 x 2  Standing heel/toe raises -min ROM Seated heel/ toe raise  Seated calf stretch with towel Seated h/s stretch- foot on rollator  STS x 10 - rolator in front  HL Ball squeeze x 10  bridge x 10- increases back pain  Supine SLR x 5 each , fatigues  Side clams x 12 each , AROM Gait 109 feet -using clinic RW Gait 76 feet - using clinic RW   [facility] Adult PT Treatment:                                                DATE: 06/11/24 Therapeutic Exercise: LAQ 2# 10 x 2  March 2# 10 x 2  STS x 10 - rolator in front  Nustep L4 x 3 min, L3 x 2 min Seated heel and toe raises Supine bridge  Supine march Side clams x 10 each , AROM    [facility] Adult  PT  Treatment:                                                DATE: 05/24/24 TTherapeutic Activity: Sit to stand using rollator walker standing  alternating march Standing hip abduction  Long arc quads 2-minute walk test  Self Care: Plan of care ,expectations, home exercise program, endurance, and attendance policy   PATIENT EDUCATION:  Education details: See above Person educated: Patient Education method: Programmer, multimedia, Demonstration, Verbal cues, and Handouts Education comprehension: verbalized understanding, returned demonstration, and needs further education  HOME EXERCISE PROGRAM: Access Code: [email or web address] URL: [email or web address] Date: 05/24/2024 Prepared by: Delon Norma  Exercises - Sit to Stand with Arm Reach Toward Target  - 1-3 x daily - 7 x weekly - 2-3 sets - 10 reps - 5 hold - Seated Long Arc Quad  - 1-3 x daily - 7 x weekly - 2 sets - 10 reps - 5 hold - Standing March with Counter Support  - 1-3 x daily - 7 x weekly - 2 sets - 10 reps  ASSESSMENT:  CLINICAL IMPRESSION: Less pain today, knee most bothersome. Continued with increased reps, resistance to challenges in previous session. Rollator raised one level. Pt c/o not feeling steady with her rolator. Recommended she return to her standard RW if she feels this way. Used clinic for ambulation to  work on activity tolerance. She became fatigues and with leg pain after 109 feet.  Will plan to assess and progress as tolerated to increased functional ability.   EVAL: Patient is a 62y.o. female who was seen today for physical therapy evaluation and treatment for deconditioning and difficulty walking due to previous vascular surgery in 2024.  Since that time the patient has not been able to walk as she was previously.  She is limited by options for foot where her right midfoot amputation poses a fall risk due to improper shoe she plans to get new shoes very soon.  Vital signs were stable during her walk but she does  tire quickly.  Patient was given a basic home exercise program and encouraged to take multiple breaks throughout the day from sitting in her recliner to stand do some light walking and improve circulation to her lower body.  OBJECTIVE IMPAIRMENTS: Abnormal gait, cardiopulmonary status limiting activity, decreased activity tolerance, decreased balance, decreased cognition, decreased mobility, difficulty walking, decreased strength, increased edema, increased fascial restrictions, obesity, and pain.   ACTIVITY LIMITATIONS: carrying, lifting, standing, sleeping, stairs, transfers, and locomotion level  PARTICIPATION LIMITATIONS: meal prep, cleaning, laundry, interpersonal relationship, driving, shopping, and community activity  PERSONAL FACTORS: Social background, Time since onset of injury/illness/exacerbation, and 3+ comorbidities: High risk for major lower extremity amputation, congestive heart failure, diabetes, smoking are also affecting patient's functional outcome.   REHAB POTENTIAL: Fair see above comorbidities  CLINICAL DECISION MAKING: Stable/uncomplicated  EVALUATION COMPLEXITY: Low   GOALS: Goals reviewed with patient? Yes  LONG TERM GOALS: Target date: 07/19/2024   Patient will be able to improve 2 min walk test distance by 50 feet or more with LRAD and no increase in pain.   Baseline: 161 with 1 standing rest break  Goal status: INITIAL  2.  Patient will be independent with final HEP upon discharge from PT and report consistent benefit following exercise completion.    Baseline:  Goal status: INITIAL  3.  Pt  will be able to demonstrate good standing balance as evidenced by balance recovery from min dynamic balance challenges.   Baseline:  Goal status: INITIAL  4.  Patient will tolerate 15 minutes of cardio to work toward improved activity tolerance  Baseline:  Goal status: INITIAL  5.  Patient will be able to negotiate obstacles with her walker including curbs and  inclines/declines with safety.  Baseline:  Goal status: INITIAL  PLAN: Feel like that PT FREQUENCY: 2x/week  PT DURATION: 6 weeks  PLANNED INTERVENTIONS: 97164- PT Re-evaluation, 97750- Physical Performance Testing, 97110-Therapeutic exercises, 97530- Therapeutic activity, 97112- Neuromuscular re-education, 97535- Self Care, [id number]- Manual therapy, [id number]- Gait training, Patient/Family education, Balance training, and DME instructions  PLAN FOR NEXT SESSION: NuStep, review home exercise program get into semireclined position consider adding straight leg raises bridges and outer hip strength balance as tolerated encouraged patient to go to the doctor and get new shoe   Harlene Persons, PTA 06/20/24 8:29 AM Phone: [phone] Fax: [phone]

## 2024-06-24 ENCOUNTER — Telehealth (HOSPITAL_COMMUNITY): Payer: Self-pay

## 2024-06-24 ENCOUNTER — Other Ambulatory Visit (HOSPITAL_COMMUNITY): Payer: Self-pay

## 2024-06-24 ENCOUNTER — Telehealth: Payer: Self-pay | Admitting: Family

## 2024-06-24 NOTE — Telephone Encounter (Signed)
 Contacted [facility] to request the following for refills:   isosorbide  Folic acid   Torsemide   [name], Paramedic [phone] 06/24/24

## 2024-06-24 NOTE — Therapy (Deleted)
 OUTPATIENT PHYSICAL THERAPY LOWER EXTREMITY TREATMENT    Patient Name: VERLIN DUKE MRN: [id number] DOB:03/11/1961, 63 y.o., female Today's Date: 06/24/2024  END OF SESSION:    Past Medical History:  Diagnosis Date   Acute renal failure ([name])    Acute respiratory failure with hypoxia ([name]) 12/02/2019   Alcohol abuse    Alcoholic cirrhosis ([name])    Anemia    Anxiety    Arthritis    knees (11/26/2018)   B12 deficiency    CAD (coronary artery disease)    a. 11/10/2014 Cath: LM nl, LAD min irregs, D1 30 ost, D2 50d, LCX 77m, OM1 80 p/m (1.5 mm vessel), OM2 26m, RCA nondom 51m-->med rx.. Demand ischemia in the setting of rapid a-fib.   Cardiomyopathy ([name])    Carotid artery disease ([name])    a. 01/2015 Carotid Angio: RICA 100, LICA 95p; b. 02/2015 s/p L CEA; c. 05/2019 Carotid U/S: RICA 100. RECA >50. LICA 1-39%.   Cellulitis    lower extremities   Cellulitis in diabetic foot ([name]) 07/08/2019   CHF (congestive heart failure) ([name])    Chronic combined systolic and diastolic CHF (congestive heart failure) ([name])    a. 10/2014 Echo: EF 40-45%; b. 10/2018 Echo: EF 45-50%, gr2 DD; c. 11/2019 Echo: EF 50%, mild LVH, gr2 DD (restrictive), antlat HK, Nl RV fxn. Mild BAE. RVSP 59.27mmHg.   CKD (chronic kidney disease), stage III ([name])    Cocaine abuse ([name])    COPD (chronic obstructive pulmonary disease) ([name])    Critical ischemia of foot ([name]) 06/07/2021   Critical limb ischemia     Critical lower limb ischemia ([name])    Demand ischemia ([name]) 10/29/2014   Depression    Diabetes mellitus without complication ([name])    Diabetic peripheral neuropathy ([name])    DVT (deep venous thrombosis) ([name])    Dyspnea    Elevated troponin    a. Chronic elevation.   Fall 05/05/2021   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[name])    Peripheral arterial disease/critical limb ischemia     GERD (gastroesophageal reflux disease)    Hyperlipemia    Hypertension    Hypokalemia    Hypomagnesemia    Hypothyroidism     Marijuana abuse    Narcotic abuse ([name])    Noncompliance    NSVT (nonsustained ventricular tachycardia) ([name])    Obesity    Osteomyelitis ([name]) 06/21/2019   PAF (paroxysmal atrial fibrillation) ([name])    Paroxysmal atrial tachycardia ([name])    Peripheral arterial disease ([name])    a. 01/2015 Angio/PTA: RSFA 99 (atherectomy/pta) - 1 vessel runoff via diff dzs peroneal; b. 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   Pneumonia    once or twice (11/26/2018)   Poorly controlled type 2 diabetes mellitus ([name])    Renal disorder    Renal insufficiency    a. 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   SIRS (systemic inflammatory response syndrome) ([name]) 04/06/2017   Sleep apnea    couldn't handle wearing the mask (11/26/2018)   Symptomatic bradycardia    a. Avoid AV blocking agent per EP. Prev req temp wire in 2017.   Tobacco abuse    Wrist fracture, left, closed, initial encounter 01/29/2015   Past Surgical History:  Procedure Laterality Date   ABDOMINAL AORTOGRAM N/A 06/26/2019   Procedure: ABDOMINAL AORTOGRAM;  Surgeon: Darron Deatrice LABOR, MD;  Location: [facility] INVASIVE CV LAB;  Service: Cardiovascular;  Laterality: N/A;   ABDOMINAL AORTOGRAM W/LOWER EXTREMITY N/A  06/07/2021   Procedure: ABDOMINAL AORTOGRAM W/LOWER EXTREMITY;  Surgeon: Court Dorn PARAS, MD;  Location: [facility] INVASIVE CV LAB;  Service: Cardiovascular;  Laterality: N/A;   ABDOMINAL AORTOGRAM W/LOWER EXTREMITY N/A 08/11/2023   Procedure: ABDOMINAL AORTOGRAM W/LOWER EXTREMITY;  Surgeon: Magda Debby SAILOR, MD;  Location: [facility] INVASIVE CV LAB;  Service: Cardiovascular;  Laterality: N/A;   AMPUTATION Right 06/14/2017   Procedure: Right foot transmetatarsal amputation;  Surgeon: Harden Jerona GAILS, MD;  Location[facility] OR;  Service: Orthopedics;  Laterality: Right;   AMPUTATION Left 06/28/2019   Procedure: AMPUTATION LEFT FIFTH TOE;  Surgeon: Oris Krystal FALCON, MD;  Location: [facility] OR;  Service: Vascular;  Laterality: Left;   AMPUTATION TOE Right 04/28/2017    Procedure: AMPUTATION OF RIGHT SECOND RAY;  Surgeon: Harden Jerona GAILS, MD;  Location: [facility] OR;  Service: Orthopedics;  Laterality: Right;   CARDIAC CATHETERIZATION     CARDIAC CATHETERIZATION N/A 01/12/2016   Procedure: Temporary Wire;  Surgeon: Lynwood Schilling, MD;  Location: [facility]VAS[facility] CV LAB;  Service: Cardiovascular;  Laterality: N/A;   CARDIOVERSION  ~ 02/2013   twice    CARDIOVERSION N/A 09/26/2023   Procedure: CARDIOVERSION (CATH LAB);  Surgeon: Alvan Ronal BRAVO, MD;  Location: [facility] INVASIVE CV LAB;  Service: Cardiovascular;  Laterality: N/A;   CAROTID ANGIOGRAM N/A 01/15/2015   Procedure: CAROTID ANGIOGRAM;  Surgeon: Dorn PARAS Court, MD;  Location: [facility] CATH LAB;  Service: Cardiovascular;  Laterality: N/A;   COLONOSCOPY N/A 03/14/2024   Procedure: COLONOSCOPY;  Surgeon: Albertus Gordy HERO, MD;  Location: [facility] ENDOSCOPY;  Service: Gastroenterology;  Laterality: N/A;   DILATION AND CURETTAGE OF UTERUS  1988   ENDARTERECTOMY Left 02/19/2015   Procedure: LEFT CAROTID ENDARTERECTOMY ;  Surgeon: Gaile LELON New, MD;  Location: [facility] OR;  Service: Vascular;  Laterality: Left;   ESOPHAGOGASTRODUODENOSCOPY N/A 03/13/2024   Procedure: EGD (ESOPHAGOGASTRODUODENOSCOPY);  Surgeon: Albertus Gordy HERO, MD;  Location: [facility] ENDOSCOPY;  Service: Gastroenterology;  Laterality: N/A;   FEMORAL-TIBIAL BYPASS GRAFT Left 06/28/2019   Procedure: BYPASS GRAFT LEFT LEG FEMORAL TO ANTERIOR TIBIAL ARTERY using LEFT GREATER SAPHENOUS VEIN;  Surgeon: Oris Krystal FALCON, MD;  Location: [facility] OR;  Service: Vascular;  Laterality: Left;   LEFT HEART CATHETERIZATION WITH CORONARY ANGIOGRAM N/A 10/31/2014   Procedure: LEFT HEART CATHETERIZATION WITH CORONARY ANGIOGRAM;  Surgeon: Lonni JONETTA Cash, MD;  Location: [facility] CATH LAB;  Service: Cardiovascular;  Laterality: N/A;   LOWER EXTREMITY ANGIOGRAM N/A 09/10/2013   Procedure: LOWER EXTREMITY ANGIOGRAM;  Surgeon: Dorn PARAS Court, MD;  Location: [facility] CATH LAB;  Service: Cardiovascular;  Laterality: N/A;   LOWER  EXTREMITY ANGIOGRAM N/A 01/15/2015   Procedure: LOWER EXTREMITY ANGIOGRAM;  Surgeon: Dorn PARAS Court, MD;  Location: [facility] CATH LAB;  Service: Cardiovascular;  Laterality: N/A;   LOWER EXTREMITY ANGIOGRAPHY N/A 04/13/2017   Procedure: Lower Extremity Angiography;  Surgeon: Court Dorn PARAS, MD;  Location: [facility] INVASIVE CV LAB;  Service: Cardiovascular;  Laterality: N/A;   LOWER EXTREMITY ANGIOGRAPHY Left 06/26/2019   Procedure: LOWER EXTREMITY ANGIOGRAPHY;  Surgeon: Darron Deatrice LABOR, MD;  Location: [facility] INVASIVE CV LAB;  Service: Cardiovascular;  Laterality: Left;   PERIPHERAL VASCULAR ATHERECTOMY Right 06/07/2021   Procedure: PERIPHERAL VASCULAR ATHERECTOMY;  Surgeon: Court Dorn PARAS, MD;  Location: [facility] INVASIVE CV LAB;  Service: Cardiovascular;  Laterality: Right;   PERIPHERAL VASCULAR BALLOON ANGIOPLASTY Left 06/26/2019   Procedure: PERIPHERAL VASCULAR BALLOON ANGIOPLASTY;  Surgeon: Darron Deatrice LABOR, MD;  Location: [facility] INVASIVE CV LAB;  Service: Cardiovascular;  Laterality: Left;  unable  to cross lt sfa occlusion   PERIPHERAL VASCULAR INTERVENTION  04/13/2017   Procedure: Peripheral Vascular Intervention;  Surgeon: Court Dorn PARAS, MD;  Location: [facility] INVASIVE CV LAB;  Service: Cardiovascular;;   PERIPHERAL VASCULAR INTERVENTION  08/11/2023   Procedure: PERIPHERAL VASCULAR INTERVENTION;  Surgeon: Magda Debby SAILOR, MD;  Location: [facility] INVASIVE CV LAB;  Service: Cardiovascular;;   PRESSURE SENSOR/CARDIOMEMS N/A 02/05/2020   Procedure: PRESSURE SENSOR/CARDIOMEMS;  Surgeon: Rolan Ezra RAMAN, MD;  Location: [facility] INVASIVE CV LAB;  Service: Cardiovascular;  Laterality: N/A;   VEIN HARVEST Left 06/28/2019   Procedure: LEFT LEG GREATER SAPHENOUS VEIN HARVEST;  Surgeon: Oris Krystal FALCON, MD;  Location: [facility] OR;  Service: Vascular;  Laterality: Left;   Patient Active Problem List   Diagnosis Date Noted   Persistent atrial fibrillation ([name]) 03/15/2024   Benign neoplasm of ascending colon 03/14/2024   Benign neoplasm of  transverse colon 03/14/2024   Benign neoplasm of descending colon 03/14/2024   Benign neoplasm of sigmoid colon 03/14/2024   Chronic systolic congestive heart failure ([name]) 03/12/2024   Melena 03/12/2024   Heme positive stool 03/11/2024   GI bleed 03/08/2024   History of CAD (coronary artery disease) 03/08/2024   CKD (chronic kidney disease), stage IV ([name]) 03/08/2024   Continuous dependence on cigarette smoking 03/08/2024   GAD (generalized anxiety disorder) 03/08/2024   HFrEF (heart failure with reduced ejection fraction) ([name]) 10/11/2023   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) and diastolic (congestive) heart failure ([name]) 10/10/2023   Pre-ulcerative calluses 04/25/2022   PAF (paroxysmal atrial fibrillation) ([name]) 05/05/2021   COPD (chronic obstructive pulmonary disease) ([name]) 05/05/2021   OSA (obstructive sleep apnea) 05/05/2021   Stage 3b chronic kidney disease ([name]) 05/05/2021   Pressure injury of skin 05/04/2021   Diabetic nephropathy ([name]) 01/02/2021   Low back pain 06/01/2020   Hyperlipidemia 04/03/2020   Peripheral artery disease ([name]) 12/26/2019   NICM (nonischemic cardiomyopathy) ([name]) 06/20/2019   Non-healing ulcer ([name]) 06/20/2019   Coagulation disorder ([name]) 08/09/2017   Depression 07/21/2017   At risk for adverse drug reaction 06/20/2017   Peripheral neuropathy 06/20/2017   S/P transmetatarsal amputation of foot, right ([name]) 06/05/2017   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) 05/19/2017   Obesity, class 2 02/24/2016   Anticoagulation management encounter 02/10/2016   Chronic sinus bradycardia 01/12/2016   Essential hypertension 12/22/2015   Demand ischemia ([name])    Acute on chronic diastolic CHF (congestive heart failure) ([name])    Symptomatic anemia 11/08/2015   Hypokalemia 11/08/2015   Tobacco abuse 10/23/2015   Coronary artery disease    DOE (dyspnea on exertion) 04/29/2015   Paroxysmal  atrial fibrillation ([name]) 01/16/2015   Carotid artery stenosis 01/16/2015   Insomnia 02/03/2014   S/P peripheral artery angioplasty - [place]bo[place] atherectomy; R SFA 09/11/2013    Class: Acute   Leg pain, bilateral 08/19/2013   Hypothyroidism 07/31/2013   History of cocaine abuse ([name]) 06/13/2013   Long term current use of anticoagulant therapy 05/20/2013   Alcohol abuse    Narcotic abuse ([name])    Marijuana abuse    Alcoholic cirrhosis ([name])    Insulin  dependent type 2 diabetes mellitus ([name])     PCP: Campbell Reynolds NP  REFERRING PROVIDER: Magda Debby MD  REFERRING DIAG: weakness and deconditioning  THERAPY DIAG:  No diagnosis found.  Rationale for Evaluation and Treatment: Rehabilitation  ONSET DATE: chronic   SUBJECTIVE:   SUBJECTIVE STATEMENT: I am better today just the  knee.    EVAL: Pt here to work on general mobility following vascular surgery.  Today is the first day she is using her new walker (Rollator).  She needs to go back to the foot doctor for new shoe.  She is difficulty walking due to recent surgeries and deconditioning.  She fell last year because her foot could not there was delayed healing .   2024 she had Stent Rt LE , Bypass LLE.     She does not do much walking. Walks to BR and the picnic table and back.   PERTINENT HISTORY: Neuropathy severe peripheral arterial disease  12 old female with a history of CHF, diabetes, CAD, LE swelling , Rt and Lt. Foot amputations    PAIN:  Are you having pain? Yes: NPRS scale: 7 Pain location: knees Pain description: Sore  Aggravating factors: walking  Relieving factors: sitting   PRECAUTIONS: Fallweakness, Rt knee   RED FLAGS: None   WEIGHT BEARING RESTRICTIONS: No  FALLS:  Has patient fallen in last 6 months? Yes. Number of falls 3  LIVING ENVIRONMENT: Lives with: lives with their family Lives in: House/apartment Stairs: No Has following equipment at home: Walker - 4 wheeledcane, shower lift    OCCUPATION: not working  PLOF: Independent with basic ADLs, Requires assistive device for independence, Needs assistance with homemaking, and Leisure: mostly sedentary , games on her phone   PATIENT GOALS: I want to be able to walk more   NEXT MD VISIT: as needed   OBJECTIVE:  Note: Objective measures were completed at Evaluation unless otherwise noted.  DIAGNOSTIC FINDINGS: none   PATIENT SURVEYS:  FABQ: Fear-Avoidance Beliefs Questionnaire (FABQ)  Date tested 05/24/24  My pain was caused by physical activity   6   Completely Agree  2. Physical activity makes my pain worse 6   Completely Agree  3. Physical activity might harm my back 0    Completely disagree  4. I should not do physical activities which (might) make my pain worse 6   Completely Agree  5. I cannot do physical activities which (might) make my pain worse 5  6. My pain was caused by work or by an accident at work 0    Completely disagree  7. My work aggravated my pain 0    Completely disagree  8. I have a claim for compensation for my pain 0    Completely disagree  9. My work is too heavy for me 4  10. My work makes or would make my pain worse 4  11. My work might harm my back 4  12. I should not do my normal work with my present pain 6   Completely Agree  13. I cannot do my normal work with my present pain 6   Completely Agree  14. I cannot do my normal work until my pain is treated 0    Completely disagree  15. I do not think that I will be back to my normal work within 3 months 0    Completely disagree  16. I do not think that I will ever be able to go back to that work 5  Total score  52   Scoring Scale 1: fear-avoidance beliefs about work - items 6,7,9,10,11,12,15 or 16. Add responses, divide by  Scale 2: fear-avoidance beliefs about physical activity - items 2,3,4,5   COGNITION: Overall cognitive status: Within functional limits for tasks assessed     SENSATION: neuropathy  EDEMA:  Swelling  present in bilateral lower extremities not triggered   POSTURE: rounded shoulders, forward head, and increased thoracic kyphosis  LOWER EXTREMITY ROM:WFL  LOWER EXTREMITY MMT:  MMT Right eval Left eval  Hip flexion 5 5  Hip extension    Hip abduction    Hip adduction    Hip internal rotation    Hip external rotation    Knee flexion 5 4+   Knee extension 5 5  Ankle dorsiflexion    Ankle plantarflexion    Ankle inversion    Ankle eversion     (Blank rows = not tested)   FUNCTIONAL TESTS:  5 times sit to stand: 15 sec   GAIT: Distance walked: 161 Assistive device utilized: Environmental consultant - 4 wheeled Level of assistance: SBA Comments: needs 1 standing rest break O2 saturation 98% heart rate 96                                                                                                                                TREATMENT DATE:   [facility] Adult PT Treatment:                                                DATE: 06/25/24 Therapeutic Exercise: *** Manual Therapy: *** Neuromuscular re-ed: *** Therapeutic Activity: *** Modalities: *** Self Care: ***  [facility] Adult PT Treatment:                                                DATE: 06/13/24 Therapeutic Activity: Nustep L5 x 2 min, L3 x 3 min LAQ 3# 10 x 2  March standing 10 x 2  Standing heel/toe raises -min ROM Seated heel/ toe raise  Seated calf stretch with towel Seated h/s stretch- foot on rollator  STS x 10 - rolator in front  HL Ball squeeze x 10  bridge x 10- increases back pain  Supine SLR x 5 each , fatigues  Side clams x 12 each , AROM Gait 109 feet -using clinic RW Gait 76 feet - using clinic RW   [facility] Adult PT Treatment:                                                DATE: 06/11/24 Therapeutic Exercise: LAQ 2# 10 x 2  March 2# 10 x 2  STS x 10 - rolator in front  Nustep L4 x 3 min, L3 x 2 min Seated heel and toe raises Supine bridge  Supine march Side clams x 10 each , AROM    [facility] Adult  PT  Treatment:                                                DATE: 05/24/24 TTherapeutic Activity: Sit to stand using rollator walker standing  alternating march Standing hip abduction  Long arc quads 2-minute walk test  Self Care: Plan of care ,expectations, home exercise program, endurance, and attendance policy   PATIENT EDUCATION:  Education details: See above Person educated: Patient Education method: Programmer, multimedia, Demonstration, Verbal cues, and Handouts Education comprehension: verbalized understanding, returned demonstration, and needs further education  HOME EXERCISE PROGRAM: Access Code: [email or web address] URL: [email or web address] Date: 05/24/2024 Prepared by: Delon Norma  Exercises - Sit to Stand with Arm Reach Toward [employer]  - 1-3 x daily - 7 x weekly - 2-3 sets - 10 reps - 5 hold - Seated Long Arc Quad  - 1-3 x daily - 7 x weekly - 2 sets - 10 reps - 5 hold - Standing March with Counter Support  - 1-3 x daily - 7 x weekly - 2 sets - 10 reps  ASSESSMENT:  CLINICAL IMPRESSION: Less pain today, knee most bothersome. Continued with increased reps, resistance to challenges in previous session. Rollator raised one level. Pt c/o not feeling steady with her rolator. Recommended she return to her standard RW if she feels this way. Used clinic for ambulation to  work on activity tolerance. She became fatigues and with leg pain after 109 feet.  Will plan to assess and progress as tolerated to increased functional ability.   EVAL: Patient is a 62y.o. female who was seen today for physical therapy evaluation and treatment for deconditioning and difficulty walking due to previous vascular surgery in 2024.  Since that time the patient has not been able to walk as she was previously.  She is limited by options for foot where her right midfoot amputation poses a fall risk due to improper shoe she plans to get new shoes very soon.  Vital signs were stable during her walk but she does  tire quickly.  Patient was given a basic home exercise program and encouraged to take multiple breaks throughout the day from sitting in her recliner to stand do some light walking and improve circulation to her lower body.  OBJECTIVE IMPAIRMENTS: Abnormal gait, cardiopulmonary status limiting activity, decreased activity tolerance, decreased balance, decreased cognition, decreased mobility, difficulty walking, decreased strength, increased edema, increased fascial restrictions, obesity, and pain.   ACTIVITY LIMITATIONS: carrying, lifting, standing, sleeping, stairs, transfers, and locomotion level  PARTICIPATION LIMITATIONS: meal prep, cleaning, laundry, interpersonal relationship, driving, shopping, and community activity  PERSONAL FACTORS: Social background, Time since onset of injury/illness/exacerbation, and 3+ comorbidities: High risk for major lower extremity amputation, congestive heart failure, diabetes, smoking are also affecting patient's functional outcome.   REHAB POTENTIAL: Fair see above comorbidities  CLINICAL DECISION MAKING: Stable/uncomplicated  EVALUATION COMPLEXITY: Low   GOALS: Goals reviewed with patient? Yes  LONG TERM GOALS: Target date: 07/19/2024   Patient will be able to improve 2 min walk test distance by 50 feet or more with LRAD and no increase in pain.   Baseline: 161 with 1 standing rest break  Goal status: INITIAL  2.  Patient will be independent with final HEP upon discharge from PT and report consistent benefit following exercise completion.    Baseline:  Goal status: INITIAL  3.  Pt  will be able to demonstrate good standing balance as evidenced by balance recovery from min dynamic balance challenges.   Baseline:  Goal status: INITIAL  4.  Patient will tolerate 15 minutes of cardio to work toward improved activity tolerance  Baseline:  Goal status: INITIAL  5.  Patient will be able to negotiate obstacles with her walker including curbs and  inclines/declines with safety.  Baseline:  Goal status: INITIAL  PLAN: Feel like that PT FREQUENCY: 2x/week  PT DURATION: 6 weeks  PLANNED INTERVENTIONS: 97164- PT Re-evaluation, 97750- Physical Performance Testing, 97110-Therapeutic exercises, 97530- Therapeutic activity, 97112- Neuromuscular re-education, 97535- Self Care, [id number]- Manual therapy, [phone]- Gait training, Patient/Family education, Balance training, and DME instructions  PLAN FOR NEXT SESSION: NuStep, review home exercise program get into semireclined position consider adding straight leg raises bridges and outer hip strength balance as tolerated encouraged patient to go to the doctor and get new shoe   Harlene Persons, PTA 06/24/24 4:28 PM Phone: [phone] Fax: [phone]

## 2024-06-24 NOTE — Progress Notes (Signed)
 Paramedicine Encounter    Patient ID: [name], female    DOB: Oct 01, 1961, 63 y.o.   MRN: [id number]   Complaints-none today   Edema-bloated-elevated cardiomems   Compliance with meds-yes  Pill box filled-yes  If so, by whom-paramedic   Refills needed-p/u the isosorbide  and torsemide - refills are needed for the folic acid    Mounjaro  -asked pharm to send that out   Pt reports she is feeling good today. She denies any c/p, no dizziness, no increased sob. Her cardiomems is elevated-med changes per tina made in pill box.   She did get appoint with kidney doc-9/3 at 1220 Reminded her of her other appointments.  She called foot doc to get appoint sch- 8/13 at 345  Meds verified and pill box refilled.   Still not taking gabapentin -feeling ok.   Will f/u next week.  She does need labs done in a few days, will send message to staff to get this sch.   BP 122/60   Pulse 63   Resp 18   Wt 218 lb (98.9 kg)   LMP  (LMP Unknown)   SpO2 97%   BMI 38.62 kg/m  Weight yesterday-? Last visit weight-215   Patient Care Team: Campbell Reynolds, NP as PCP - General Court Dorn PARAS, MD as PCP - Cardiology (Cardiology) Rolan Ezra GORMAN, MD as PCP - Advanced Heart Failure (Cardiology) Inocencio Soyla Lunger, MD as PCP - Electrophysiology (Cardiology) Court Dorn PARAS, MD as Consulting Physician (Cardiology) Darlean Ozell NOVAK, MD as Consulting Physician (Pulmonary Disease) System, Provider Not In  Patient Active Problem List   Diagnosis Date Noted   Persistent atrial fibrillation ([name]) 03/15/2024   Benign neoplasm of ascending colon 03/14/2024   Benign neoplasm of transverse colon 03/14/2024   Benign neoplasm of descending colon 03/14/2024   Benign neoplasm of sigmoid colon 03/14/2024   Chronic systolic congestive heart failure ([name]) 03/12/2024   Melena 03/12/2024   Heme positive stool 03/11/2024   GI bleed 03/08/2024   History of CAD (coronary artery disease) 03/08/2024   CKD  (chronic kidney disease), stage IV ([name]) 03/08/2024   Continuous dependence on cigarette smoking 03/08/2024   GAD (generalized anxiety disorder) 03/08/2024   HFrEF (heart failure with reduced ejection fraction) ([name]) 10/11/2023   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) and diastolic (congestive) heart failure ([name]) 10/10/2023   Pre-ulcerative calluses 04/25/2022   PAF (paroxysmal atrial fibrillation) ([name]) 05/05/2021   COPD (chronic obstructive pulmonary disease) ([name]) 05/05/2021   OSA (obstructive sleep apnea) 05/05/2021   Stage 3b chronic kidney disease ([name]) 05/05/2021   Pressure injury of skin 05/04/2021   Diabetic nephropathy ([name]) 01/02/2021   Low back pain 06/01/2020   Hyperlipidemia 04/03/2020   Peripheral artery disease ([name]) 12/26/2019   NICM (nonischemic cardiomyopathy) ([name]) 06/20/2019   Non-healing ulcer ([name]) 06/20/2019   Coagulation disorder ([name]) 08/09/2017   Depression 07/21/2017   At risk for adverse drug reaction 06/20/2017   Peripheral neuropathy 06/20/2017   S/P transmetatarsal amputation of foot, right ([name]) 06/05/2017   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) 05/19/2017   Obesity, class 2 02/24/2016   Anticoagulation management encounter 02/10/2016   Chronic sinus bradycardia 01/12/2016   Essential hypertension 12/22/2015   Demand ischemia ([name])    Acute on chronic diastolic CHF (congestive heart failure) ([name])    Symptomatic anemia 11/08/2015   Hypokalemia 11/08/2015   Tobacco abuse 10/23/2015   Coronary artery disease    DOE (dyspnea on exertion) 04/29/2015  Paroxysmal atrial fibrillation ([name]) 01/16/2015   Carotid artery stenosis 01/16/2015   Insomnia 02/03/2014   S/P peripheral artery angioplasty - TurboHawk atherectomy; R SFA 09/11/2013    Class: Acute   Leg pain, bilateral 08/19/2013   Hypothyroidism 07/31/2013   History of cocaine abuse ([name]) 06/13/2013   Long term current use  of anticoagulant therapy 05/20/2013   Alcohol abuse    Narcotic abuse ([name])    Marijuana abuse    Alcoholic cirrhosis ([name])    Insulin  dependent type 2 diabetes mellitus ([name])     Current Outpatient Medications:    acetaminophen  (TYLENOL ) 500 MG tablet, Take 1,000 mg by mouth every 6 (six) hours as needed for headache (pain)., Disp: , Rfl:    albuterol  (VENTOLIN  HFA) 108 (90 Base) MCG/ACT inhaler, USE 2 PUFFS BY MOUTH EVERY FOUR HOURS, AS NEEDED, FOR COUGHING/WHEEZING, Disp: 8.5 g, Rfl: 0   allopurinol  (ZYLOPRIM ) 100 MG tablet, Take 150 mg by mouth daily., Disp: , Rfl:    amiodarone  (PACERONE ) 200 MG tablet, Take 0.5 tablets (100 mg total) by mouth daily., Disp: 30 tablet, Rfl: 1   budesonide -formoterol  (SYMBICORT ) 160-4.5 MCG/ACT inhaler, Inhale 2 puffs into the lungs 2 (two) times daily., Disp: 1 each, Rfl: 2   buPROPion  (WELLBUTRIN  XL) 300 MG 24 hr tablet, Take 300 mg by mouth every morning., Disp: , Rfl:    Cholecalciferol  (VITAMIN D3) 50 MCG (2000 UT) capsule, Take 1 capsule by mouth every morning., Disp: , Rfl:    colchicine  0.6 MG tablet, Take 1.2 mg by mouth See admin instructions. Take 1.2 mg for flair up and an hour later take 0.6 mg if needed, Disp: , Rfl:    Continuous Glucose Sensor (FREESTYLE LIBRE 3 PLUS SENSOR) MISC, as directed., Disp: , Rfl:    docusate sodium  (COLACE) 100 MG capsule, Take 2 capsules (200 mg total) by mouth at bedtime., Disp: 10 capsule, Rfl: 0   ELIQUIS  5 MG TABS tablet, TAKE 1 TABLET (5 MG TOTAL) BY MOUTH 2 (TWO) TIMES DAILY., Disp: 60 tablet, Rfl: 6   erythromycin ophthalmic ointment, Place 1 Application into the right eye 2 (two) times daily., Disp: , Rfl:    fluticasone  (FLONASE) 50 MCG/ACT nasal spray, Place 1 spray into both nostrils as needed for allergies or rhinitis., Disp: , Rfl:    folic acid  (FOLVITE ) 1 MG tablet, Take 1 tablet (1 mg total) by mouth daily., Disp: 30 tablet, Rfl: 0   insulin  lispro (HUMALOG ) 100 UNIT/ML KwikPen, Inject 15 Units  into the skin 3 (three) times daily with meals. Plus sliding scale, max dose 70 units /day, Disp: 25 mL, Rfl: 1   isosorbide -hydrALAZINE  (BIDIL ) 20-37.5 MG tablet, Take 1 tablet by mouth 3 (three) times daily., Disp: 270 tablet, Rfl: 3   LANTUS  SOLOSTAR 100 UNIT/ML Solostar Pen, Inject 30 Units into the skin daily., Disp: 15 mL, Rfl: 0   levothyroxine  (SYNTHROID ) 50 MCG tablet, TAKE 1 TABLET (50 MCG TOTAL) BY MOUTH DAILY BEFORE BREAKFAST (AM), Disp: 30 tablet, Rfl: 0   metoprolol  succinate (TOPROL -XL) 25 MG 24 hr tablet, Take 1 tablet (25 mg total) by mouth daily., Disp: 90 tablet, Rfl: 3   Omega-3 Fatty Acids (FISH OIL PO), Take 1 tablet by mouth daily., Disp: , Rfl:    pantoprazole  (PROTONIX ) 40 MG tablet, TAKE 1 TABLET (40 MG TOTAL) BY MOUTH DAILY(AM), Disp: 30 tablet, Rfl: 0   potassium chloride  SA (KLOR-CON  M) 20 MEQ tablet, Take 2 tablets (40 mEq total) by mouth 2 (two) times  daily. (Patient taking differently: Take 20 mEq by mouth 2 (two) times daily.), Disp: 120 tablet, Rfl: 11   tirzepatide  (MOUNJARO ) 7.5 MG/0.5ML Pen, Inject 7.5 mg into the skin once a week., Disp: 6 mL, Rfl: 1   VITAMIN E PO, Take 1 tablet by mouth daily., Disp: , Rfl:    gabapentin  (NEURONTIN ) 100 MG capsule, Take 1 capsule (100 mg total) by mouth 3 (three) times daily. (Patient not taking: Reported on 06/24/2024), Disp: 270 capsule, Rfl: 1   Glucagon  (BAQSIMI  ONE PACK) 3 MG/DOSE POWD, Place 1 Device into the nose as needed (Low blood sugar with impaired consciousness)., Disp: 2 each, Rfl: 3   Menthol -Camphor (ICY HOT PRO NO MESS EX), Apply 1 Application topically 3 (three) times daily as needed (arms, neuropathy lower extrimity, gout in hand.)., Disp: , Rfl:    NYAMYC  powder, Apply 1 Application topically 2 (two) times daily., Disp: , Rfl:    nystatin  cream (MYCOSTATIN ), Apply 1 Application topically 2 (two) times daily as needed for dry skin., Disp: , Rfl:    polyethylene glycol (MIRALAX  / GLYCOLAX ) 17 g packet, Take 17 g  by mouth daily as needed for moderate constipation., Disp: 14 each, Rfl: 0   rosuvastatin  (CRESTOR ) 40 MG tablet, Take 1 tablet (40 mg total) by mouth daily. (Patient taking differently: Take 40 mg by mouth at bedtime.), Disp: 90 tablet, Rfl: 3   torsemide  (DEMADEX ) 20 MG tablet, Take 4 tablets (80 mg total) by mouth 2 (two) times daily. (Patient taking differently: Take 60 mg by mouth 2 (two) times daily.), Disp: , Rfl:    triamcinolone  ointment (KENALOG ) 0.1 %, Apply topically 2 (two) times daily., Disp: 454 g, Rfl: 0 Allergies  Allergen Reactions   Neurontin  [Gabapentin ] Nausea And Vomiting and Other (See Comments)    POSSIBLE SHAKING   Lyrica  [Pregabalin ] Other (See Comments)    Shaking    Other     Neurontin        Social History   Socioeconomic History   Marital status: Single    Spouse name: Not on file   Number of children: 1   Years of education: 12   Highest education level: 12th grade  Occupational History   Occupation: disabled  Tobacco Use   Smoking status: Every Day    Current packs/day: 1.00    Average packs/day: 1 pack/day for 44.0 years (44.0 ttl pk-yrs)    Types: E-cigarettes, Cigarettes   Smokeless tobacco: Former    Types: Snuff  Vaping Use   Vaping status: Former   Devices: 11/26/2018 stopped months ago  Substance and Sexual Activity   Alcohol use: Not Currently    Comment: occ   Drug use: Yes    Types: Crack cocaine, Marijuana, Oxycodone    Sexual activity: Not Currently  Other Topics Concern   Not on file  Social History Narrative   ** Merged History Encounter **       Lives in [place], in motel with sister.  They are looking to move but don't have a place to go yet.     Social Drivers of Corporate investment banker Strain: Low Risk  (02/09/2023)   Overall Financial Resource Strain (CARDIA)    Difficulty of Paying Living Expenses: Not hard at all  Food Insecurity: No Food Insecurity (03/09/2024)   Hunger Vital Sign    Worried About Running  Out of Food in the Last Year: Never true    Ran Out of Food in the Last Year: Never true  Transportation Needs: Unmet Transportation Needs (03/09/2024)   PRAPARE - Transportation    Lack of Transportation (Medical): Yes    Lack of Transportation (Non-Medical): Yes  Physical Activity: Inactive (02/09/2023)   Exercise Vital Sign    Days of Exercise per Week: 0 days    Minutes of Exercise per Session: 0 min  Stress: No Stress Concern Present (02/09/2023)   [employer] of Occupational Health - Occupational Stress Questionnaire    Feeling of Stress : Not at all  Social Connections: Unknown (08/21/2023)   Received from [facility]   Social Network    Social Network: Not on file  Intimate Partner Violence: Not At Risk (03/09/2024)   Humiliation, Afraid, Rape, and Kick questionnaire    Fear of Current or Ex-Partner: No    Emotionally Abused: No    Physically Abused: No    Sexually Abused: No    Physical Exam      Future Appointments  Date Time Provider Department Center  06/25/2024 10:20 AM Dartha Ernst, MD [facility] None  06/25/2024 11:00 AM Johnston Delon CROME, PT [facility]  06/26/2024  3:45 PM Janit Thresa HERO, DPM [facility]  06/27/2024 10:15 AM Carita Harlene HERO, PTA [facility]  07/30/2024  3:10 PM Aniceto Daphne CROME, NP [facility]       [name], Paramedic [phone] [facility] Paramedic  06/24/24

## 2024-06-24 NOTE — Telephone Encounter (Signed)
  Cardiomems Remote Monitoring  S/P Cardiomems Implant 02/05/20  PAD Goal: 20 Most recent reading: 27 indicating fluid accumulation  Recommended changes: (Staff to call patient) Increase torsemide  to 80mg  BID X 2 days. Check BMET in 3-4 days. Monitor sodium and fluid intake carefully. Will also try and get f/u scheduled with [place] [facility]  I continue to review and analyze the patients PA pressures weekly (and more often as needed) to bring PA pressures within the optimal range.

## 2024-06-25 ENCOUNTER — Ambulatory Visit: Admitting: Physical Therapy

## 2024-06-25 ENCOUNTER — Encounter: Payer: Self-pay | Admitting: "Endocrinology

## 2024-06-25 ENCOUNTER — Ambulatory Visit (INDEPENDENT_AMBULATORY_CARE_PROVIDER_SITE_OTHER): Admitting: "Endocrinology

## 2024-06-25 VITALS — BP 126/70 | HR 75 | Ht 63.0 in | Wt 220.0 lb

## 2024-06-25 DIAGNOSIS — Z7985 Long-term (current) use of injectable non-insulin antidiabetic drugs: Secondary | ICD-10-CM | POA: Diagnosis not present

## 2024-06-25 DIAGNOSIS — Z794 Long term (current) use of insulin: Secondary | ICD-10-CM | POA: Diagnosis not present

## 2024-06-25 DIAGNOSIS — E782 Mixed hyperlipidemia: Secondary | ICD-10-CM

## 2024-06-25 DIAGNOSIS — E1165 Type 2 diabetes mellitus with hyperglycemia: Secondary | ICD-10-CM

## 2024-06-25 MED ORDER — DEXCOM G7 SENSOR MISC: 1.0000 | 3 refills | Status: AC

## 2024-06-25 MED ORDER — DEXCOM G7 RECEIVER DEVI: 1.0000 | 0 refills | Status: AC

## 2024-06-25 NOTE — Progress Notes (Addendum)
 Outpatient Endocrinology Note Karla Birmingham, MD  06/25/24   [name] October 20, 1961 [id number]  Referring Provider: Campbell Reynolds, NP Primary Care Provider: Campbell Reynolds, NP Reason for consultation: Subjective   Assessment & Plan  Diagnoses and all orders for this visit:  Uncontrolled type 2 diabetes mellitus with hyperglycemia ([name]) -     Continuous Glucose Sensor (DEXCOM G7 SENSOR) MISC; 1 Device by Does not apply route continuous. -     Continuous Glucose Receiver (DEXCOM G7 RECEIVER) DEVI; 1 Device by Does not apply route continuous.  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, No results found for: GFR Hba1c goal less than 7, current Hba1c is  Lab Results  Component Value Date   HGBA1C 6.9 (A) 05/22/2024   Will recommend the following: Lantus  52 units at bedtime Humalog  30 units tid 15 min before meals  (Skip if sugar is less than 70, cut the dose in half if sugar is between 71-100) Humalog  correction scale: Use in addition to your meal time/short acting insulin  based on blood sugars as follows: 151 - 180: 1 unit 181 - 210: 2 units 211 - 240: 3 units 241 - 270: 4 units 271 - 300: 5 units 301 - 330: 6 units 331 - 360: 7 units 361 - 390: 8 units 391 - 420: 9 units  Mounjaro  7.5 mg weekly (max dose tolerated) 06/25/24: Saw patient today after she arrived 20 min late for her appointment, whereas the cut off for us  in 10 min. She said her herlene information was a week old but in reality, it was last dated 58 weeks old (06/11/24). I cannot safely adjust given lack of adequate blood sugar data: explained it to the patient, given log sheet, ordered dexcom g7 as patient says her herlene is not covered, patient will follow up her PCP until she can provide me at least 1 week of blood sugar log to safely adjust her  insulin  doses.  Note sent to PCP  Has libre 01/25/24 Ordered DM education   No known contraindications/side effects to any of above medications Glucagon  discussed and prescribed with refills on 01/25/24   No history of MEN syndrome/medullary thyroid  cancer/pancreatitis or pancreatic cancer in self or family  -Last LD and Tg are as follows: Lab Results  Component Value Date   LDLCALC 79 02/29/2024    Lab Results  Component Value Date   TRIG 386 (H) 02/29/2024   -On rosuvastatin  40 mg every day, takes one a day fish oil  -Follow low fat diet and exercise   -Blood pressure goal <140/90 - Microalbumin/creatinine goal is < 30 -Last MA/Cr is as follows: Lab Results  Component Value Date   MICROALBUR 9.0 01/23/2024   -on ACE/ARB losartan  25 mg every day  -diet changes including salt restriction -limit eating outside -counseled BP targets per standards of diabetes care -uncontrolled blood pressure can lead to retinopathy, nephropathy and cardiovascular and atherosclerotic heart disease  Reviewed and counseled on: -A1C target -Blood sugar targets -Complications of uncontrolled diabetes  -Checking blood sugar before meals and bedtime and bring log next visit -All medications with mechanism of action and side effects -Hypoglycemia management: rule of 15's, Glucagon  Emergency Kit and medical alert ID -low-carb low-fat plate-method diet -At least 20 minutes of physical activity per day -Annual dilated retinal eye exam and foot exam -compliance and follow up needs -follow  up as scheduled or earlier if problem gets worse  Call if blood sugar is less than 70 or consistently above 250    Take a 15 gm snack of carbohydrate at bedtime before you go to sleep if your blood sugar is less than 100.    If you are going to fast after midnight for a test or procedure, ask your physician for instructions on how to reduce/decrease your insulin  dose.    Call if blood sugar is less than 70 or  consistently above 250  -Treating a low sugar by rule of 15  (15 gms of sugar every 15 min until sugar is more than 70) If you feel your sugar is low, test your sugar to be sure If your sugar is low (less than 70), then take 15 grams of a fast acting Carbohydrate (3-4 glucose tablets or glucose gel or 4 ounces of juice or regular soda) Recheck your sugar 15 min after treating low to make sure it is more than 70 If sugar is still less than 70, treat again with 15 grams of carbohydrate          Don't drive the hour of hypoglycemia  If unconscious/unable to eat or drink by mouth, use glucagon  injection or nasal spray baqsimi  and call 911. Can repeat again in 15 min if still unconscious.  No follow-ups on file.   I have reviewed current medications, nurse's notes, allergies, vital signs, past medical and surgical history, family medical history, and social history for this encounter. Counseled patient on symptoms, examination findings, lab findings, imaging results, treatment decisions and monitoring and prognosis. The patient understood the recommendations and agrees with the treatment plan. All questions regarding treatment plan were fully answered.  Karla Birmingham, MD  06/25/24    History of Present Illness [name] is a 63 y.o. year old female who presents for follow up for Type II diabetes mellitus.  [name] was first diagnosed around 2014.   Diabetes education +  Home diabetes regimen: Lantus  58 units at bedtime Humalog  30 units tid 30 min before meals, skips if BG<100 Mounjaro  7.5 mg/week   COMPLICATIONS -  MI/Stroke +  retinopathy +  neuropathy +  nephropathy  BLOOD SUGAR DATA BG 103-252: few readings  CGM interpretation: At today's visit, we reviewed her CGM downloads. The full report is scanned in the media. Reviewing the CGM trends, BG are lows randomly across the day, sepcially overnight/daytime.  Physical Exam  BP 126/70   Pulse 75   Ht 5' 3 (1.6 m)    Wt 220 lb (99.8 kg)   LMP  (LMP Unknown)   SpO2 96%   BMI 38.97 kg/m    Constitutional: well developed, well nourished Head: normocephalic, atraumatic Eyes: sclera anicteric, no redness Neck: supple Lungs: normal respiratory effort Neurology: alert and oriented Skin: dry, no appreciable rashes Musculoskeletal: no appreciable defects Psychiatric: normal mood and affect Diabetic Foot Exam - Simple   Simple Foot Form Diabetic Foot exam was performed with the following findings: Yes 06/25/2024 10:59 AM  Visual Inspection No deformities, no ulcerations, no other skin breakdown bilaterally: Yes Sensation Testing See comments: Yes Pulse Check Posterior Tibialis and Dorsalis pulse intact bilaterally: Yes Comments Monofilament 0/3 R and 2/3 L   R foot all toes amputed, L toe one toe amputed    Current Medications Patient's Medications  New Prescriptions   CONTINUOUS GLUCOSE RECEIVER (DEXCOM G7 RECEIVER) DEVI    1 Device by Does not apply  route continuous.   CONTINUOUS GLUCOSE SENSOR (DEXCOM G7 SENSOR) MISC    1 Device by Does not apply route continuous.  Previous Medications   ACETAMINOPHEN  (TYLENOL ) 500 MG TABLET    Take 1,000 mg by mouth every 6 (six) hours as needed for headache (pain).   ALBUTEROL  (VENTOLIN  HFA) 108 (90 BASE) MCG/ACT INHALER    USE 2 PUFFS BY MOUTH EVERY FOUR HOURS, AS NEEDED, FOR COUGHING/WHEEZING   ALLOPURINOL  (ZYLOPRIM ) 100 MG TABLET    Take 150 mg by mouth daily.   AMIODARONE  (PACERONE ) 200 MG TABLET    Take 0.5 tablets (100 mg total) by mouth daily.   BUDESONIDE -FORMOTEROL  (SYMBICORT ) 160-4.5 MCG/ACT INHALER    Inhale 2 puffs into the lungs 2 (two) times daily.   BUPROPION  (WELLBUTRIN  XL) 300 MG 24 HR TABLET    Take 300 mg by mouth every morning.   CHOLECALCIFEROL  (VITAMIN D3) 50 MCG (2000 UT) CAPSULE    Take 1 capsule by mouth every morning.   COLCHICINE  0.6 MG TABLET    Take 1.2 mg by mouth See admin instructions. Take 1.2 mg for flair up and an hour later  take 0.6 mg if needed   CONTINUOUS GLUCOSE SENSOR (FREESTYLE LIBRE 3 PLUS SENSOR) MISC    as directed.   DOCUSATE SODIUM  (COLACE) 100 MG CAPSULE    Take 2 capsules (200 mg total) by mouth at bedtime.   ELIQUIS  5 MG TABS TABLET    TAKE 1 TABLET (5 MG TOTAL) BY MOUTH 2 (TWO) TIMES DAILY.   ERYTHROMYCIN OPHTHALMIC OINTMENT    Place 1 Application into the right eye 2 (two) times daily.   FLUTICASONE  (FLONASE) 50 MCG/ACT NASAL SPRAY    Place 1 spray into both nostrils as needed for allergies or rhinitis.   FOLIC ACID  (FOLVITE ) 1 MG TABLET    Take 1 tablet (1 mg total) by mouth daily.   GABAPENTIN  (NEURONTIN ) 100 MG CAPSULE    Take 1 capsule (100 mg total) by mouth 3 (three) times daily.   GLUCAGON  (BAQSIMI  ONE PACK) 3 MG/DOSE POWD    Place 1 Device into the nose as needed (Low blood sugar with impaired consciousness).   INSULIN  LISPRO (HUMALOG ) 100 UNIT/ML KWIKPEN    Inject 15 Units into the skin 3 (three) times daily with meals. Plus sliding scale, max dose 70 units /day   ISOSORBIDE -HYDRALAZINE  (BIDIL ) 20-37.5 MG TABLET    Take 1 tablet by mouth 3 (three) times daily.   LANTUS  SOLOSTAR 100 UNIT/ML SOLOSTAR PEN    Inject 30 Units into the skin daily.   LEVOTHYROXINE  (SYNTHROID ) 50 MCG TABLET    TAKE 1 TABLET (50 MCG TOTAL) BY MOUTH DAILY BEFORE BREAKFAST (AM)   MENTHOL -CAMPHOR (ICY HOT PRO NO MESS EX)    Apply 1 Application topically 3 (three) times daily as needed (arms, neuropathy lower extrimity, gout in hand.).   METOPROLOL  SUCCINATE (TOPROL -XL) 25 MG 24 HR TABLET    Take 1 tablet (25 mg total) by mouth daily.   NYAMYC  POWDER    Apply 1 Application topically 2 (two) times daily.   NYSTATIN  CREAM (MYCOSTATIN )    Apply 1 Application topically 2 (two) times daily as needed for dry skin.   OMEGA-3 FATTY ACIDS (FISH OIL PO)    Take 1 tablet by mouth daily.   PANTOPRAZOLE  (PROTONIX ) 40 MG TABLET    TAKE 1 TABLET (40 MG TOTAL) BY MOUTH DAILY(AM)   POLYETHYLENE GLYCOL (MIRALAX  / GLYCOLAX ) 17 G PACKET     Take 17  g by mouth daily as needed for moderate constipation.   POTASSIUM CHLORIDE  SA (KLOR-CON  M) 20 MEQ TABLET    Take 2 tablets (40 mEq total) by mouth 2 (two) times daily.   ROSUVASTATIN  (CRESTOR ) 40 MG TABLET    Take 1 tablet (40 mg total) by mouth daily.   TIRZEPATIDE  (MOUNJARO ) 7.5 MG/0.5ML PEN    Inject 7.5 mg into the skin once a week.   TORSEMIDE  (DEMADEX ) 20 MG TABLET    Take 4 tablets (80 mg total) by mouth 2 (two) times daily.   TRIAMCINOLONE  OINTMENT (KENALOG ) 0.1 %    Apply topically 2 (two) times daily.   VITAMIN E PO    Take 1 tablet by mouth daily.  Modified Medications   No medications on file  Discontinued Medications   No medications on file    Allergies Allergies  Allergen Reactions   Neurontin  [Gabapentin ] Nausea And Vomiting and Other (See Comments)    POSSIBLE SHAKING   Lyrica  [Pregabalin ] Other (See Comments)    Shaking    Other     Neurontin      Past Medical History Past Medical History:  Diagnosis Date   Acute renal failure ([name])    Acute respiratory failure with hypoxia ([name]) 12/02/2019   Alcohol abuse    Alcoholic cirrhosis ([name])    Anemia    Anxiety    Arthritis    knees (11/26/2018)   B12 deficiency    CAD (coronary artery disease)    a. 11/10/2014 Cath: LM nl, LAD min irregs, D1 30 ost, D2 50d, LCX 65m, OM1 80 p/m (1.5 mm vessel), OM2 47m, RCA nondom 77m-->med rx.. Demand ischemia in the setting of rapid a-fib.   Cardiomyopathy ([name])    Carotid artery disease ([name])    a. 01/2015 Carotid Angio: RICA 100, LICA 95p; b. 02/2015 s/p L CEA; c. 05/2019 Carotid U/S: RICA 100. RECA >50. LICA 1-39%.   Cellulitis    lower extremities   Cellulitis in diabetic foot ([name]) 07/08/2019   CHF (congestive heart failure) ([name])    Chronic combined systolic and diastolic CHF (congestive heart failure) ([name])    a. 10/2014 Echo: EF 40-45%; b. 10/2018 Echo: EF 45-50%, gr2 DD; c. 11/2019 Echo: EF 50%, mild LVH, gr2 DD (restrictive), antlat HK, Nl RV fxn. Mild BAE.  RVSP 59.23mmHg.   CKD (chronic kidney disease), stage III ([name])    Cocaine abuse ([name])    COPD (chronic obstructive pulmonary disease) ([name])    Critical ischemia of foot ([name]) 06/07/2021   Critical limb ischemia     Critical lower limb ischemia ([name])    Demand ischemia ([name]) 10/29/2014   Depression    Diabetes mellitus without complication ([name])    Diabetic peripheral neuropathy ([name])    DVT (deep venous thrombosis) ([name])    Dyspnea    Elevated troponin    a. Chronic elevation.   Fall 05/05/2021   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[name])    Peripheral arterial disease/critical limb ischemia     GERD (gastroesophageal reflux disease)    Hyperlipemia    Hypertension    Hypokalemia    Hypomagnesemia    Hypothyroidism    Marijuana abuse    Narcotic abuse ([name])    Noncompliance    NSVT (nonsustained ventricular tachycardia) ([name])    Obesity    Osteomyelitis ([name]) 06/21/2019   PAF (paroxysmal atrial fibrillation) ([name])    Paroxysmal atrial tachycardia ([name])    Peripheral arterial disease ([name])    a. 01/2015 Angio/PTA:  RSFA 99 (atherectomy/pta) - 1 vessel runoff via diff dzs peroneal; b. 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   Pneumonia    once or twice (11/26/2018)   Poorly controlled type 2 diabetes mellitus ([name])    Renal disorder    Renal insufficiency    a. 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   SIRS (systemic inflammatory response syndrome) ([name]) 04/06/2017   Sleep apnea    couldn't handle wearing the mask (11/26/2018)   Symptomatic bradycardia    a. Avoid AV blocking agent per EP. Prev req temp wire in 2017.   Tobacco abuse    Wrist fracture, left, closed, initial encounter 01/29/2015    Past Surgical History Past Surgical History:  Procedure Laterality Date   ABDOMINAL AORTOGRAM N/A 06/26/2019   Procedure: ABDOMINAL AORTOGRAM;  Surgeon: Darron Deatrice LABOR, MD;  Location: [facility];  Service: Cardiovascular;  Laterality: N/A;   ABDOMINAL AORTOGRAM  W/LOWER EXTREMITY N/A 06/07/2021   Procedure: ABDOMINAL AORTOGRAM W/LOWER EXTREMITY;  Surgeon: Court Dorn PARAS, MD;  Location: [facility];  Service: Cardiovascular;  Laterality: N/A;   ABDOMINAL AORTOGRAM W/LOWER EXTREMITY N/A 08/11/2023   Procedure: ABDOMINAL AORTOGRAM W/LOWER EXTREMITY;  Surgeon: Magda Debby SAILOR, MD;  Location: [facility];  Service: Cardiovascular;  Laterality: N/A;   AMPUTATION Right 06/14/2017   Procedure: Right foot transmetatarsal amputation;  Surgeon: Harden Jerona GAILS, MD;  Location: [facility] OR;  Service: Orthopedics;  Laterality: Right;   AMPUTATION Left 06/28/2019   Procedure: AMPUTATION LEFT FIFTH TOE;  Surgeon: Oris Krystal FALCON, MD;  Location: [facility] OR;  Service: Vascular;  Laterality: Left;   AMPUTATION TOE Right 04/28/2017   Procedure: AMPUTATION OF RIGHT SECOND RAY;  Surgeon: Harden Jerona GAILS, MD;  Location: [facility] OR;  Service: Orthopedics;  Laterality: Right;   CARDIAC CATHETERIZATION     CARDIAC CATHETERIZATION N/A 01/12/2016   Procedure: Temporary Wire;  Surgeon: Lynwood Schilling, MD;  Location: [facility];  Service: Cardiovascular;  Laterality: N/A;   CARDIOVERSION  ~ 02/2013   twice    CARDIOVERSION N/A 09/26/2023   Procedure: CARDIOVERSION (CATH LAB);  Surgeon: Alvan Ronal BRAVO, MD;  Location: [facility] INVASIVE CV LAB;  Service: Cardiovascular;  Laterality: N/A;   CAROTID ANGIOGRAM N/A 01/15/2015   Procedure: CAROTID ANGIOGRAM;  Surgeon: Dorn PARAS Court, MD;  Location: [facility] CATH LAB;  Service: Cardiovascular;  Laterality: N/A;   COLONOSCOPY N/A 03/14/2024   Procedure: COLONOSCOPY;  Surgeon: Albertus Gordy HERO, MD;  Location: [facility] ENDOSCOPY;  Service: Gastroenterology;  Laterality: N/A;   DILATION AND CURETTAGE OF UTERUS  1988   ENDARTERECTOMY Left 02/19/2015   Procedure: LEFT CAROTID ENDARTERECTOMY ;  Surgeon: Gaile LELON New, MD;  Location: [facility] OR;  Service: Vascular;  Laterality: Left;   ESOPHAGOGASTRODUODENOSCOPY N/A 03/13/2024   Procedure: EGD (ESOPHAGOGASTRODUODENOSCOPY);   Surgeon: Albertus Gordy HERO, MD;  Location: [facility] ENDOSCOPY;  Service: Gastroenterology;  Laterality: N/A;   FEMORAL-TIBIAL BYPASS GRAFT Left 06/28/2019   Procedure: BYPASS GRAFT LEFT LEG FEMORAL TO ANTERIOR TIBIAL ARTERY using LEFT GREATER SAPHENOUS VEIN;  Surgeon: Oris Krystal FALCON, MD;  Location: [facility] OR;  Service: Vascular;  Laterality: Left;   LEFT HEART CATHETERIZATION WITH CORONARY ANGIOGRAM N/A 10/31/2014   Procedure: LEFT HEART CATHETERIZATION WITH CORONARY ANGIOGRAM;  Surgeon: Lonni JONETTA Cash, MD;  Location: [facility] CATH LAB;  Service: Cardiovascular;  Laterality: N/A;   LOWER EXTREMITY ANGIOGRAM N/A 09/10/2013   Procedure: LOWER EXTREMITY ANGIOGRAM;  Surgeon: Dorn PARAS Court, MD;  Location:  [facility] CATH LAB;  Service: Cardiovascular;  Laterality: N/A;   LOWER EXTREMITY ANGIOGRAM N/A 01/15/2015   Procedure: LOWER EXTREMITY ANGIOGRAM;  Surgeon: Dorn JINNY Lesches, MD;  Location: [facility] CATH LAB;  Service: Cardiovascular;  Laterality: N/A;   LOWER EXTREMITY ANGIOGRAPHY N/A 04/13/2017   Procedure: Lower Extremity Angiography;  Surgeon: Lesches Dorn JINNY, MD;  Location: [facility] INVASIVE CV LAB;  Service: Cardiovascular;  Laterality: N/A;   LOWER EXTREMITY ANGIOGRAPHY Left 06/26/2019   Procedure: LOWER EXTREMITY ANGIOGRAPHY;  Surgeon: Darron Deatrice LABOR, MD;  Location: [facility];  Service: Cardiovascular;  Laterality: Left;   PERIPHERAL VASCULAR ATHERECTOMY Right 06/07/2021   Procedure: PERIPHERAL VASCULAR ATHERECTOMY;  Surgeon: Lesches Dorn JINNY, MD;  Location: [facility] INVASIVE CV LAB;  Service: Cardiovascular;  Laterality: Right;   PERIPHERAL VASCULAR BALLOON ANGIOPLASTY Left 06/26/2019   Procedure: PERIPHERAL VASCULAR BALLOON ANGIOPLASTY;  Surgeon: Darron Deatrice LABOR, MD;  Location: [facility];  Service: Cardiovascular;  Laterality: Left;  unable to cross lt sfa occlusion   PERIPHERAL VASCULAR INTERVENTION  04/13/2017   Procedure: Peripheral Vascular Intervention;  Surgeon: Lesches Dorn JINNY, MD;  Location: [facility]  INVASIVE CV LAB;  Service: Cardiovascular;;   PERIPHERAL VASCULAR INTERVENTION  08/11/2023   Procedure: PERIPHERAL VASCULAR INTERVENTION;  Surgeon: Magda Debby SAILOR, MD;  Location: [facility];  Service: Cardiovascular;;   PRESSURE SENSOR/CARDIOMEMS N/A 02/05/2020   Procedure: PRESSURE SENSOR/CARDIOMEMS;  Surgeon: Rolan Ezra RAMAN, MD;  Location: [facility] INVASIVE CV LAB;  Service: Cardiovascular;  Laterality: N/A;   VEIN HARVEST Left 06/28/2019   Procedure: LEFT LEG GREATER SAPHENOUS VEIN HARVEST;  Surgeon: Oris Krystal FALCON, MD;  Location: [facility] OR;  Service: Vascular;  Laterality: Left;    Family History family history includes Breast cancer in her mother; Cancer in her mother; Clotting disorder in her mother; Diabetes in her mother; Emphysema in her sister; Heart attack in her mother; Heart disease in her father and mother; Hypertension in her father and mother.  Social History Social History   Socioeconomic History   Marital status: Single    Spouse name: Not on file   Number of children: 1   Years of education: 12   Highest education level: 12th grade  Occupational History   Occupation: disabled  Tobacco Use   Smoking status: Every Day    Current packs/day: 1.00    Average packs/day: 1 pack/day for 44.0 years (44.0 ttl pk-yrs)    Types: E-cigarettes, Cigarettes   Smokeless tobacco: Former    Types: Snuff  Vaping Use   Vaping status: Former   Devices: 11/26/2018 stopped months ago  Substance and Sexual Activity   Alcohol use: Not Currently    Comment: occ   Drug use: Yes    Types: Crack cocaine, Marijuana, Oxycodone    Sexual activity: Not Currently  Other Topics Concern   Not on file  Social History Narrative   ** Merged History Encounter **       Lives in [place], in motel with sister.  They are looking to move but don't have a place to go yet.     Social Drivers of Corporate investment banker Strain: Low Risk  (02/09/2023)   Overall Financial Resource Strain (CARDIA)     Difficulty of Paying Living Expenses: Not hard at all  Food Insecurity: No Food Insecurity (03/09/2024)   Hunger Vital Sign    Worried About Running Out of Food in the Last Year: Never true    Ran Out of Food in the Last  Year: Never true  Transportation Needs: Unmet Transportation Needs (03/09/2024)   PRAPARE - Transportation    Lack of Transportation (Medical): Yes    Lack of Transportation (Non-Medical): Yes  Physical Activity: Inactive (02/09/2023)   Exercise Vital Sign    Days of Exercise per Week: 0 days    Minutes of Exercise per Session: 0 min  Stress: No Stress Concern Present (02/09/2023)   [employer] of Occupational Health - Occupational Stress Questionnaire    Feeling of Stress : Not at all  Social Connections: Unknown (08/21/2023)   Received from [employer]   Social Network    Social Network: Not on file  Intimate Partner Violence: Not At Risk (03/09/2024)   Humiliation, Afraid, Rape, and Kick questionnaire    Fear of Current or Ex-Partner: No    Emotionally Abused: No    Physically Abused: No    Sexually Abused: No    Lab Results  Component Value Date   HGBA1C 6.9 (A) 05/22/2024   HGBA1C 12.6 (H) 01/23/2024   HGBA1C 12.0 (H) 09/25/2023   Lab Results  Component Value Date   CHOL 163 02/29/2024   Lab Results  Component Value Date   HDL 37 (L) 02/29/2024   Lab Results  Component Value Date   LDLCALC 79 02/29/2024   Lab Results  Component Value Date   TRIG 386 (H) 02/29/2024   Lab Results  Component Value Date   CHOLHDL 4.4 02/29/2024   Lab Results  Component Value Date   CREATININE 2.16 (H) 05/31/2024   No results found for: GFR Lab Results  Component Value Date   MICROALBUR 9.0 01/23/2024      Component Value Date/Time   NA 141 05/31/2024 1303   NA 140 09/07/2022 1144   K 4.4 05/31/2024 1303   CL 107 05/31/2024 1303   CO2 22 05/31/2024 1303   GLUCOSE 134 (H) 05/31/2024 1303   BUN 67 (H) 05/31/2024 1303   BUN 26 (H) 09/07/2022  1144   CREATININE 2.16 (H) 05/31/2024 1303   CREATININE 2.01 (H) 01/23/2024 1444   CALCIUM  8.9 05/31/2024 1303   PROT 6.2 (L) 04/24/2024 1322   PROT 6.7 07/07/2022 1155   ALBUMIN  2.6 (L) 04/24/2024 1322   ALBUMIN  4.0 07/07/2022 1155   AST 14 (L) 04/24/2024 1322   ALT 16 04/24/2024 1322   ALKPHOS 96 04/24/2024 1322   BILITOT 0.5 04/24/2024 1322   BILITOT <0.2 07/07/2022 1155   GFRNONAA 25 (L) 05/31/2024 1303   GFRAA 39 (L) 06/08/2020 1130      Latest Ref Rng & Units 05/31/2024    1:03 PM 05/21/2024    3:17 PM 05/06/2024    2:19 PM  BMP  Glucose 70 - 99 mg/dL 865  881  788   BUN 8 - 23 mg/dL 67  79  62   Creatinine 0.44 - 1.00 mg/dL 7.83  7.35  7.54   Sodium 135 - 145 mmol/L 141  141  140   Potassium 3.5 - 5.1 mmol/L 4.4  4.1  5.0   Chloride 98 - 111 mmol/L 107  102  100   CO2 22 - 32 mmol/L 22  24  23    Calcium  8.9 - 10.3 mg/dL 8.9  8.9  9.1        Component Value Date/Time   WBC 7.9 04/24/2024 1322   RBC 2.78 (L) 04/24/2024 1322   HGB 8.0 (L) 04/24/2024 1322   HGB 10.5 (L) 05/25/2021 1444   HCT 27.4 (L) 04/24/2024  1322   HCT 35.5 05/25/2021 1444   PLT 268 04/24/2024 1322   PLT 368 05/25/2021 1444   MCV 98.6 04/24/2024 1322   MCV 88 05/25/2021 1444   MCH 28.8 04/24/2024 1322   MCHC 29.2 (L) 04/24/2024 1322   RDW 17.2 (H) 04/24/2024 1322   RDW 14.1 05/25/2021 1444   LYMPHSABS 1.1 04/24/2024 1322   LYMPHSABS 1.6 06/20/2019 1125   MONOABS 0.5 04/24/2024 1322   EOSABS 0.3 04/24/2024 1322   EOSABS 0.3 06/20/2019 1125   BASOSABS 0.0 04/24/2024 1322   BASOSABS 0.0 06/20/2019 1125     Parts of this note may have been dictated using voice recognition software. There may be variances in spelling and vocabulary which are unintentional. Not all errors are proofread. Please notify the dino if any discrepancies are noted or if the meaning of any statement is not clear.

## 2024-06-26 ENCOUNTER — Ambulatory Visit (INDEPENDENT_AMBULATORY_CARE_PROVIDER_SITE_OTHER): Admitting: Podiatry

## 2024-06-26 VITALS — Ht 63.0 in | Wt 220.0 lb

## 2024-06-26 DIAGNOSIS — M79676 Pain in unspecified toe(s): Secondary | ICD-10-CM | POA: Diagnosis not present

## 2024-06-26 DIAGNOSIS — B351 Tinea unguium: Secondary | ICD-10-CM

## 2024-06-27 ENCOUNTER — Ambulatory Visit: Admitting: Physical Therapy

## 2024-06-27 ENCOUNTER — Encounter: Payer: Self-pay | Admitting: Physical Therapy

## 2024-06-27 ENCOUNTER — Telehealth (HOSPITAL_COMMUNITY): Payer: Self-pay | Admitting: Licensed Clinical Social Worker

## 2024-06-27 DIAGNOSIS — Z7409 Other reduced mobility: Secondary | ICD-10-CM

## 2024-06-27 DIAGNOSIS — R262 Difficulty in walking, not elsewhere classified: Secondary | ICD-10-CM | POA: Diagnosis present

## 2024-06-27 NOTE — Therapy (Signed)
 OUTPATIENT PHYSICAL THERAPY LOWER EXTREMITY TREATMENT    Patient Name: [name] MRN: [id number] DOB:06-Apr-1961, 63 y.o., female Today's Date: 06/27/2024  END OF SESSION:  PT End of Session - 06/27/24 1019     Visit Number 4    Number of Visits 12    Date for PT Re-Evaluation 07/05/24    Authorization Type [employer] Dual Complete    PT Start Time 1018    PT Stop Time 1100    PT Time Calculation (min) 42 min          Past Medical History:  Diagnosis Date   Acute renal failure ([name])    Acute respiratory failure with hypoxia ([name]) 12/02/2019   Alcohol abuse    Alcoholic cirrhosis ([name])    Anemia    Anxiety    Arthritis    knees (11/26/2018)   B12 deficiency    CAD (coronary artery disease)    a. 11/10/2014 Cath: LM nl, LAD min irregs, D1 30 ost, D2 50d, LCX 32m, OM1 80 p/m (1.5 mm vessel), OM2 89m, RCA nondom 57m-->med rx.. Demand ischemia in the setting of rapid a-fib.   Cardiomyopathy ([name])    Carotid artery disease ([name])    a. 01/2015 Carotid Angio: RICA 100, LICA 95p; b. 02/2015 s/p L CEA; c. 05/2019 Carotid U/S: RICA 100. RECA >50. LICA 1-39%.   Cellulitis    lower extremities   Cellulitis in diabetic foot ([name]) 07/08/2019   CHF (congestive heart failure) ([name])    Chronic combined systolic and diastolic CHF (congestive heart failure) ([name])    a. 10/2014 Echo: EF 40-45%; b. 10/2018 Echo: EF 45-50%, gr2 DD; c. 11/2019 Echo: EF 50%, mild LVH, gr2 DD (restrictive), antlat HK, Nl RV fxn. Mild BAE. RVSP 59.55mmHg.   CKD (chronic kidney disease), stage III ([name])    Cocaine abuse ([name])    COPD (chronic obstructive pulmonary disease) ([name])    Critical ischemia of foot ([name]) 06/07/2021   Critical limb ischemia     Critical lower limb ischemia ([name])    Demand ischemia ([name]) 10/29/2014   Depression    Diabetes mellitus without complication ([name])    Diabetic peripheral neuropathy ([name])    DVT (deep venous thrombosis) ([name])    Dyspnea    Elevated troponin    a. Chronic  elevation.   Fall 05/05/2021   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[name])    Peripheral arterial disease/critical limb ischemia     GERD (gastroesophageal reflux disease)    Hyperlipemia    Hypertension    Hypokalemia    Hypomagnesemia    Hypothyroidism    Marijuana abuse    Narcotic abuse ([name])    Noncompliance    NSVT (nonsustained ventricular tachycardia) ([name])    Obesity    Osteomyelitis ([name]) 06/21/2019   PAF (paroxysmal atrial fibrillation) ([name])    Paroxysmal atrial tachycardia ([name])    Peripheral arterial disease ([name])    a. 01/2015 Angio/PTA: RSFA 99 (atherectomy/pta) - 1 vessel runoff via diff dzs peroneal; b. 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   Pneumonia    once or twice (11/26/2018)   Poorly controlled type 2 diabetes mellitus ([name])    Renal disorder    Renal insufficiency    a. 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   SIRS (systemic inflammatory response syndrome) ([name]) 04/06/2017   Sleep apnea    couldn't handle wearing the mask (11/26/2018)   Symptomatic bradycardia    a. Avoid AV blocking agent  per EP. Prev req temp wire in 2017.   Tobacco abuse    Wrist fracture, left, closed, initial encounter 01/29/2015   Past Surgical History:  Procedure Laterality Date   ABDOMINAL AORTOGRAM N/A 06/26/2019   Procedure: ABDOMINAL AORTOGRAM;  Surgeon: Darron Deatrice LABOR, MD;  Location: [facility];  Service: Cardiovascular;  Laterality: N/A;   ABDOMINAL AORTOGRAM W/LOWER EXTREMITY N/A 06/07/2021   Procedure: ABDOMINAL AORTOGRAM W/LOWER EXTREMITY;  Surgeon: Court Dorn PARAS, MD;  Location: [facility];  Service: Cardiovascular;  Laterality: N/A;   ABDOMINAL AORTOGRAM W/LOWER EXTREMITY N/A 08/11/2023   Procedure: ABDOMINAL AORTOGRAM W/LOWER EXTREMITY;  Surgeon: Magda Debby SAILOR, MD;  Location: [facility];  Service: Cardiovascular;  Laterality: N/A;   AMPUTATION Right 06/14/2017   Procedure: Right foot transmetatarsal amputation;  Surgeon: Harden Jerona GAILS, MD;  Location: [facility] OR;  Service: Orthopedics;  Laterality: Right;   AMPUTATION Left 06/28/2019   Procedure: AMPUTATION LEFT FIFTH TOE;  Surgeon: Oris Krystal FALCON, MD;  Location: [facility] OR;  Service: Vascular;  Laterality: Left;   AMPUTATION TOE Right 04/28/2017   Procedure: AMPUTATION OF RIGHT SECOND RAY;  Surgeon: Harden Jerona GAILS, MD;  Location: [facility] OR;  Service: Orthopedics;  Laterality: Right;   CARDIAC CATHETERIZATION     CARDIAC CATHETERIZATION N/A 01/12/2016   Procedure: Temporary Wire;  Surgeon: Lynwood Schilling, MD;  Location: [facility];  Service: Cardiovascular;  Laterality: N/A;   CARDIOVERSION  ~ 02/2013   twice    CARDIOVERSION N/A 09/26/2023   Procedure: CARDIOVERSION (CATH LAB);  Surgeon: Alvan Ronal BRAVO, MD;  Location: [facility] INVASIVE CV LAB;  Service: Cardiovascular;  Laterality: N/A;   CAROTID ANGIOGRAM N/A 01/15/2015   Procedure: CAROTID ANGIOGRAM;  Surgeon: Dorn PARAS Court, MD;  Location: [facility] CATH LAB;  Service: Cardiovascular;  Laterality: N/A;   COLONOSCOPY N/A 03/14/2024   Procedure: COLONOSCOPY;  Surgeon: Albertus Gordy HERO, MD;  Location: [facility] ENDOSCOPY;  Service: Gastroenterology;  Laterality: N/A;   DILATION AND CURETTAGE OF UTERUS  1988   ENDARTERECTOMY Left 02/19/2015   Procedure: LEFT CAROTID ENDARTERECTOMY ;  Surgeon: Gaile LELON New, MD;  Location: [facility] OR;  Service: Vascular;  Laterality: Left;   ESOPHAGOGASTRODUODENOSCOPY N/A 03/13/2024   Procedure: EGD (ESOPHAGOGASTRODUODENOSCOPY);  Surgeon: Albertus Gordy HERO, MD;  Location: [facility] ENDOSCOPY;  Service: Gastroenterology;  Laterality: N/A;   FEMORAL-TIBIAL BYPASS GRAFT Left 06/28/2019   Procedure: BYPASS GRAFT LEFT LEG FEMORAL TO ANTERIOR TIBIAL ARTERY using LEFT GREATER SAPHENOUS VEIN;  Surgeon: Oris Krystal FALCON, MD;  Location: [facility] OR;  Service: Vascular;  Laterality: Left;   LEFT HEART CATHETERIZATION WITH CORONARY ANGIOGRAM N/A 10/31/2014   Procedure: LEFT HEART CATHETERIZATION WITH CORONARY ANGIOGRAM;  Surgeon: Lonni JONETTA Cash,  MD;  Location: [facility] CATH LAB;  Service: Cardiovascular;  Laterality: N/A;   LOWER EXTREMITY ANGIOGRAM N/A 09/10/2013   Procedure: LOWER EXTREMITY ANGIOGRAM;  Surgeon: Dorn PARAS Court, MD;  Location: [facility] C[facility] LAB;  Service: Cardiovascular;  Laterality: N/A;   LOWER EXTREMITY ANGIOGRAM N/A 01/15/2015   Procedure: LOWER EXTREMITY ANGIOGRAM;  Surgeon: Dorn PARAS Court, MD;  Location: [facility] CATH LAB;  Service: Cardiovascular;  Laterality: N/A;   LOWER EXTREMITY ANGIOGRAPHY N/A 04/13/2017   Procedure: Lower Extremity Angiography;  Surgeon: Court Dorn PARAS, MD;  Location: [facility] INVASIVE CV LAB;  Service: Cardiovascular;  Laterality: N/A;   LOWER EXTREMITY ANGIOGRAPHY Left 06/26/2019   Procedure: LOWER EXTREMITY ANGIOGRAPHY;  Surgeon: Darron Deatrice LABOR, MD;  Location: [facility];  Service: Cardiovascular;  Laterality:  Left;   PERIPHERAL VASCULAR ATHERECTOMY Right 06/07/2021   Procedure: PERIPHERAL VASCULAR ATHERECTOMY;  Surgeon: Court Dorn PARAS, MD;  Location: [facility] INVASIVE CV LAB;  Service: Cardiovascular;  Laterality: Right;   PERIPHERAL VASCULAR BALLOON ANGIOPLASTY Left 06/26/2019   Procedure: PERIPHERAL VASCULAR BALLOON ANGIOPLASTY;  Surgeon: Darron Deatrice LABOR, MD;  Location: [facility];  Service: Cardiovascular;  Laterality: Left;  unable to cross lt sfa occlusion   PERIPHERAL VASCULAR INTERVENTION  04/13/2017   Procedure: Peripheral Vascular Intervention;  Surgeon: Court Dorn PARAS, MD;  Location: [facility] INVASIVE CV LAB;  Service: Cardiovascular;;   PERIPHERAL VASCULAR INTERVENTION  08/11/2023   Procedure: PERIPHERAL VASCULAR INTERVENTION;  Surgeon: Magda Debby SAILOR, MD;  Location: [facility];  Service: Cardiovascular;;   PRESSURE SENSOR/CARDIOMEMS N/A 02/05/2020   Procedure: PRESSURE SENSOR/CARDIOMEMS;  Surgeon: Rolan Ezra RAMAN, MD;  Location: [facility] INVASIVE CV LAB;  Service: Cardiovascular;  Laterality: N/A;   VEIN HARVEST Left 06/28/2019   Procedure: LEFT LEG GREATER SAPHENOUS VEIN HARVEST;   Surgeon: Oris Krystal FALCON, MD;  Location: [facility] OR;  Service: Vascular;  Laterality: Left;   Patient Active Problem List   Diagnosis Date Noted   Persistent atrial fibrillation ([name]) 03/15/2024   Benign neoplasm of ascending colon 03/14/2024   Benign neoplasm of transverse colon 03/14/2024   Benign neoplasm of descending colon 03/14/2024   Benign neoplasm of sigmoid colon 03/14/2024   Chronic systolic congestive heart failure ([name]) 03/12/2024   Melena 03/12/2024   Heme positive stool 03/11/2024   GI bleed 03/08/2024   History of CAD (coronary artery disease) 03/08/2024   CKD (chronic kidney disease), stage IV ([name]) 03/08/2024   Continuous dependence on cigarette smoking 03/08/2024   GAD (generalized anxiety disorder) 03/08/2024   HFrEF (heart failure with reduced ejection fraction) ([name]) 10/11/2023   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) and diastolic (congestive) heart failure ([name]) 10/10/2023   Pre-ulcerative calluses 04/25/2022   PAF (paroxysmal atrial fibrillation) ([name]) 05/05/2021   COPD (chronic obstructive pulmonary disease) ([name]) 05/05/2021   OSA (obstructive sleep apnea) 05/05/2021   Stage 3b chronic kidney disease ([name]) 05/05/2021   Pressure injury of skin 05/04/2021   Diabetic nephropathy ([name]) 01/02/2021   Low back pain 06/01/2020   Hyperlipidemia 04/03/2020   Peripheral artery disease ([name]) 12/26/2019   NICM (nonischemic cardiomyopathy) ([name]) 06/20/2019   Non-healing ulcer ([name]) 06/20/2019   Coagulation disorder ([name]) 08/09/2017   Depression 07/21/2017   At risk for adverse drug reaction 06/20/2017   Peripheral neuropathy 06/20/2017   S/P transmetatarsal amputation of foot, right ([name]) 06/05/2017   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) 05/19/2017   Obesity, class 2 02/24/2016   Anticoagulation management encounter 02/10/2016   Chronic sinus bradycardia 01/12/2016   Essential hypertension 12/22/2015    Demand ischemia ([name])    Acute on chronic diastolic CHF (congestive heart failure) ([name])    Symptomatic anemia 11/08/2015   Hypokalemia 11/08/2015   Tobacco abuse 10/23/2015   Coronary artery disease    DOE (dyspnea on exertion) 04/29/2015   Paroxysmal atrial fibrillation ([name]) 01/16/2015   Carotid artery stenosis 01/16/2015   Insomnia 02/03/2014   S/P peripheral artery angioplasty - [place]bo[place] atherectomy; R SFA 09/11/2013    Class: Acute   Leg pain, bilateral 08/19/2013   Hypothyroidism 07/31/2013   History of cocaine abuse ([name]) 06/13/2013   Long term current use of anticoagulant therapy 05/20/2013   Alcohol abuse    Narcotic abuse ([name])    Marijuana abuse  Alcoholic cirrhosis ([name])    Insulin  dependent type 2 diabetes mellitus ([name])     PCP: Campbell Reynolds NP  REFERRING PROVIDER: Magda Ned MD  REFERRING DIAG: weakness and deconditioning  THERAPY DIAG:  Difficulty in walking, not elsewhere classified  Decreased functional mobility and endurance  Rationale for Evaluation and Treatment: Rehabilitation  ONSET DATE: chronic   SUBJECTIVE:   SUBJECTIVE STATEMENT: Knee hurts a little. Went to Foot doctor. I have to go somewhere else to get a new shoe. My feet hurts some too due to neuropathy.    EVAL: Pt here to work on general mobility following vascular surgery.  Today is the first day she is using her new walker (Rollator).  She needs to go back to the foot doctor for new shoe.  She is difficulty walking due to recent surgeries and deconditioning.  She fell last year because her foot could not there was delayed healing .   2024 she had Stent Rt LE , Bypass LLE.     She does not do much walking. Walks to BR and the picnic table and back.   PERTINENT HISTORY: Neuropathy severe peripheral arterial disease  4 old female with a history of CHF, diabetes, CAD, LE swelling , Rt and Lt. Foot amputations    PAIN:  Are you having pain? Yes: NPRS scale: 5 Pain  location: knees Pain description: Sore  Aggravating factors: walking  Relieving factors: sitting   PRECAUTIONS: Fallweakness, Rt knee   RED FLAGS: None   WEIGHT BEARING RESTRICTIONS: No  FALLS:  Has patient fallen in last 6 months? Yes. Number of falls 3  LIVING ENVIRONMENT: Lives with: lives with their family Lives in: House/apartment Stairs: No Has following equipment at home: Walker - 4 wheeledcane, shower lift   OCCUPATION: not working  PLOF: Independent with basic ADLs, Requires assistive device for independence, Needs assistance with homemaking, and Leisure: mostly sedentary , games on her phone   PATIENT GOALS: I want to be able to walk more   NEXT MD VISIT: as needed   OBJECTIVE:  Note: Objective measures were completed at Evaluation unless otherwise noted.  DIAGNOSTIC FINDINGS: none   PATIENT SURVEYS:  FABQ: Fear-Avoidance Beliefs Questionnaire (FABQ)  Date tested 05/24/24  My pain was caused by physical activity   6   Completely Agree  2. Physical activity makes my pain worse 6   Completely Agree  3. Physical activity might harm my back 0    Completely disagree  4. I should not do physical activities which (might) make my pain worse 6   Completely Agree  5. I cannot do physical activities which (might) make my pain worse 5  6. My pain was caused by work or by an accident at work 0    Completely disagree  7. My work aggravated my pain 0    Completely disagree  8. I have a claim for compensation for my pain 0    Completely disagree  9. My work is too heavy for me 4  10. My work makes or would make my pain worse 4  11. My work might harm my back 4  12. I should not do my normal work with my present pain 6   Completely Agree  13. I cannot do my normal work with my present pain 6   Completely Agree  14. I cannot do my normal work until my pain is treated 0    Completely disagree  15. I do not think that  I will be back to my normal work within 3 months 0     Completely disagree  16. I do not think that I will ever be able to go back to that work 5  Total score  52   Scoring Scale 1: fear-avoidance beliefs about work - items 6,7,9,10,11,12,15 or 16. Add responses, divide by  Scale 2: fear-avoidance beliefs about physical activity - items 2,3,4,5   COGNITION: Overall cognitive status: Within functional limits for tasks assessed     SENSATION: neuropathy  EDEMA:  Swelling present in bilateral lower extremities not triggered   POSTURE: rounded shoulders, forward head, and increased thoracic kyphosis  LOWER EXTREMITY ROM:WFL  LOWER EXTREMITY MMT:  MMT Right eval Left eval  Hip flexion 5 5  Hip extension    Hip abduction    Hip adduction    Hip internal rotation    Hip external rotation    Knee flexion 5 4+   Knee extension 5 5  Ankle dorsiflexion    Ankle plantarflexion    Ankle inversion    Ankle eversion     (Blank rows = not tested)   FUNCTIONAL TESTS:  5 times sit to stand: 15 sec  2 MWT 161 feet 06/27/24: 164 feet in 2 MWT  GAIT: Distance walked: 161 Assistive device utilized: Environmental consultant - 4 wheeled Level of assistance: SBA Comments: needs 1 standing rest break O2 saturation 98% heart rate 96                                                                                                                                TREATMENT DATE:  [facility] Adult PT Treatment:                                                DATE: 06/27/24 Therapeutic Exercise/Activity:  Nustep L4 x 7 minutes  for activity tolerance  2 MWT 164 feet Standing heel raise  Standing hip flexion 10 x 2 each  Standing hip abduction x 10 each  STS 2 x 10 without UE- RW in front - with intermittent reach  LAQ 4# 10 x 2 each  SLR x 10  Supine clam GTB x 20  Bridge 10 x 2  HL ball squeeze 10 x 2  LTR     [facility] Adult PT Treatment:                                                DATE: 06/13/24 Therapeutic Activity: Nustep L5 x 2 min, L3 x 3 min LAQ 3# 10  x 2  March standing 10 x 2  Standing heel/toe raises -min ROM Seated heel/ toe raise  Seated calf stretch with towel Seated h/s stretch- foot on rollator  STS x 10 - rolator in front  HL Ball squeeze x 10  bridge x 10- increases back pain  Supine SLR x 5 each , fatigues  Side clams x 12 each , AROM Gait 109 feet -using clinic RW Gait 76 feet - using clinic RW   [facility] Adult PT Treatment:                                                DATE: 06/11/24 Therapeutic Exercise: LAQ 2# 10 x 2  March 2# 10 x 2  STS x 10 - rolator in front  Nustep L4 x 3 min, L3 x 2 min Seated heel and toe raises Supine bridge  Supine march Side clams x 10 each , AROM    [facility] Adult PT Treatment:                                                DATE: 05/24/24 TTherapeutic Activity: Sit to stand using rollator walker standing  alternating march Standing hip abduction  Long arc quads 2-minute walk test  Self Care: Plan of care ,expectations, home exercise program, endurance, and attendance policy   PATIENT EDUCATION:  Education details: See above Person educated: Patient Education method: Programmer, multimedia, Facilities manager, Verbal cues, and Handouts Education comprehension: verbalized understanding, returned demonstration, and needs further education  HOME EXERCISE PROGRAM: Access Code: [email or web address] URL: [email or web address] Date: 05/24/2024 Prepared by: Delon Norma  Exercises - Sit to Stand with Arm Reach Toward Target  - 1-3 x daily - 7 x weekly - 2-3 sets - 10 reps - 5 hold - Seated Long Arc Quad  - 1-3 x daily - 7 x weekly - 2 sets - 10 reps - 5 hold - Standing March with Counter Support  - 1-3 x daily - 7 x weekly - 2 sets - 10 reps  ASSESSMENT:  CLINICAL IMPRESSION: Pt arrives after 2 weeks absence due to clinic closure and appt conflicts. She has continued with her HEP and has been walking to mailbox with a family member present. Today, she demonstrates improved tolerance to Nustep  and able to continue with 2 MWT with a total of nearly 10 minutes of continuous activity. Continued with closed chain strengthening and activity tolerance after a rest break. Also improved tolerance to SLR and mat based strengthening. Will plan to progress to balance in future sessions while continuing strength and activity tolerance as tolerated.     EVAL: Patient is a 62y.o. female who was seen today for physical therapy evaluation and treatment for deconditioning and difficulty walking due to previous vascular surgery in 2024.  Since that time the patient has not been able to walk as she was previously.  She is limited by options for foot where her right midfoot amputation poses a fall risk due to improper shoe she plans to get new shoes very soon.  Vital signs were stable during her walk but she does tire quickly.  Patient was given a basic home exercise program and encouraged to take multiple breaks throughout the day from sitting in her recliner to stand do some light walking and improve circulation to her lower body.  OBJECTIVE  IMPAIRMENTS: Abnormal gait, cardiopulmonary status limiting activity, decreased activity tolerance, decreased balance, decreased cognition, decreased mobility, difficulty walking, decreased strength, increased edema, increased fascial restrictions, obesity, and pain.   ACTIVITY LIMITATIONS: carrying, lifting, standing, sleeping, stairs, transfers, and locomotion level  PARTICIPATION LIMITATIONS: meal prep, cleaning, laundry, interpersonal relationship, driving, shopping, and community activity  PERSONAL FACTORS: Social background, Time since onset of injury/illness/exacerbation, and 3+ comorbidities: High risk for major lower extremity amputation, congestive heart failure, diabetes, smoking are also affecting patient's functional outcome.   REHAB POTENTIAL: Fair see above comorbidities  CLINICAL DECISION MAKING: Stable/uncomplicated  EVALUATION COMPLEXITY:  Low   GOALS: Goals reviewed with patient? Yes  LONG TERM GOALS: Target date: 07/19/2024   Patient will be able to improve 2 min walk test distance by 50 feet or more with LRAD and no increase in pain.   Baseline: 161 with 1 standing rest break  06/27/24: 164 with 1 standing rest break  Goal status: ONGOING  2.  Patient will be independent with final HEP upon discharge from PT and report consistent benefit following exercise completion.    Baseline:  Goal status: INITIAL  3.  Pt will be able to demonstrate good standing balance as evidenced by balance recovery from min dynamic balance challenges.   Baseline:  Goal status: INITIAL  4.  Patient will tolerate 15 minutes of cardio to work toward improved activity tolerance  Baseline:  06/27/24: tolerated 10 minutes of combined Nustep and walking  Goal status: ONGOING  5.  Patient will be able to negotiate obstacles with her walker including curbs and inclines/declines with safety.  Baseline:  Goal status: INITIAL  PLAN: Feel like that PT FREQUENCY: 2x/week  PT DURATION: 6 weeks  PLANNED INTERVENTIONS: 97164- PT Re-evaluation, 97750- Physical Performance Testing, 97110-Therapeutic exercises, 97530- Therapeutic activity, 97112- Neuromuscular re-education, 97535- Self Care, [id number]- Manual therapy, [phone]- Gait training, Patient/Family education, Balance training, and DME instructions  PLAN FOR NEXT SESSION: NuStep, review home exercise program get into semireclined position consider adding straight leg raises bridges and outer hip strength balance as tolerated encouraged patient to go to the doctor and get new shoe   Harlene Persons, PTA 06/27/24 11:17 AM Phone: [phone] Fax: [phone]

## 2024-06-27 NOTE — Telephone Encounter (Signed)
 H&V Care Navigation CSW Progress Note  Clinical Social Worker consulted to assist pt with obtaining disability placard application.  CSW had physician complete medical portion and sign- paramedic will pick up and provide to patient.   SDOH Screenings   Food Insecurity: No Food Insecurity (03/09/2024)  Housing: Low Risk  (03/09/2024)  Transportation Needs: Unmet Transportation Needs (03/09/2024)  Utilities: Not At Risk (03/09/2024)  Alcohol Screen: Low Risk  (01/28/2020)  Depression (PHQ2-9): Low Risk  (02/09/2023)  Financial Resource Strain: Low Risk  (02/09/2023)  Physical Activity: Inactive (02/09/2023)  Social Connections: Unknown (08/21/2023)   Received from [facility]  Stress: No Stress Concern Present (02/09/2023)  Tobacco Use: High Risk (06/25/2024)   Karla HILARIO Leech, LCSW Clinical Social Worker [facility] Desk#: [phone] Cell#: [phone]

## 2024-06-28 ENCOUNTER — Ambulatory Visit (HOSPITAL_COMMUNITY)
Admission: RE | Admit: 2024-06-28 | Discharge: 2024-06-28 | Disposition: A | Discharge status: Home / Self Care | Source: Ambulatory Visit | Attending: Cardiology | Admitting: Cardiology

## 2024-06-28 ENCOUNTER — Ambulatory Visit (HOSPITAL_COMMUNITY): Payer: Self-pay | Admitting: Cardiology

## 2024-06-28 DIAGNOSIS — I5022 Chronic systolic (congestive) heart failure: Secondary | ICD-10-CM | POA: Insufficient documentation

## 2024-06-28 LAB — BASIC METABOLIC PANEL WITH GFR
Anion gap: 6 (ref 5–15)
BUN: 54 mg/dL — ABNORMAL HIGH (ref 8–23)
CO2: 26 mmol/L (ref 22–32)
Calcium: 8.3 mg/dL — ABNORMAL LOW (ref 8.9–10.3)
Chloride: 109 mmol/L (ref 98–111)
Creatinine, Ser: 2.23 mg/dL — ABNORMAL HIGH (ref 0.44–1.00)
GFR, Estimated: 24 mL/min — ABNORMAL LOW (ref >60)
Glucose, Bld: 123 mg/dL — ABNORMAL HIGH (ref 70–99)
Potassium: 4 mmol/L (ref 3.5–5.1)
Sodium: 141 mmol/L (ref 135–145)

## 2024-07-01 ENCOUNTER — Telehealth: Payer: Self-pay | Admitting: Family

## 2024-07-01 ENCOUNTER — Other Ambulatory Visit: Payer: Self-pay | Admitting: Family

## 2024-07-01 ENCOUNTER — Other Ambulatory Visit (HOSPITAL_COMMUNITY): Payer: Self-pay

## 2024-07-01 ENCOUNTER — Telehealth: Payer: Self-pay | Admitting: Acute Care

## 2024-07-01 ENCOUNTER — Telehealth: Payer: Self-pay

## 2024-07-01 MED ORDER — METOLAZONE 2.5 MG PO TABS
2.5000 mg | ORAL_TABLET | ORAL | 0 refills | Status: DC
Start: 2024-07-01 — End: 2024-07-29

## 2024-07-01 NOTE — Telephone Encounter (Signed)
  Cardiomems Remote Monitoring  S/P Cardiomems Implant 02/05/2020   PAD Goal: 20  Most recent reading: 26 indicating fluid buildup  Recommended changes: Metolazone  2.5mg  every other day X 2 doses. Emphasized daily cardiomems reading and fluid/ sodium restriction. Message sent to paramedic K Lynch while she was in patient's home and information was relayed to the patient. Will get BMET in 7-10 days.   I continue to review and analyze the patients PA pressures weekly (and more often as needed) to bring PA pressures within the optimal range.     [name] Class, FNP-C 07/01/24

## 2024-07-01 NOTE — Telephone Encounter (Signed)
 LVM for patient to call and review recent CT results.   Call Report from Tiffany:  IMPRESSION: 1. Lung-RADS 3, probably benign findings. Short-term follow-up in 6 months is recommended with repeat low-dose chest CT without contrast. New left upper lobe pulmonary nodule measuring 4.3 mm. 2. Hyperdensity within a left lower lobe segmental pulmonary artery, which may represent a pulmonary embolus. 3. Aortic Atherosclerosis (ICD10-I70.0). Coronary artery calcifications. Assessment for potential risk factor modification, dietary therapy or pharmacologic therapy may be warranted, if clinically indicated.

## 2024-07-01 NOTE — Telephone Encounter (Signed)
 Call report received:    IMPRESSION: 1. Lung-RADS 3, probably benign findings. Short-term follow-up in 6 months is recommended with repeat low-dose chest CT without contrast. New left upper lobe pulmonary nodule measuring 4.3 mm. 2. Hyperdensity within a left lower lobe segmental pulmonary artery, which may represent a pulmonary embolus. 3. Aortic Atherosclerosis (ICD10-I70.0). Coronary artery calcifications. Assessment for potential risk factor modification, dietary therapy or pharmacologic therapy may be warranted, if clinically indicated.

## 2024-07-01 NOTE — Progress Notes (Unsigned)
 Paramedicine Encounter    Patient ID: [name], female    DOB: 1961/04/28, 63 y.o.   MRN: [id number]   Complaints-leg pain   Edema-yes-rt leg/abd bloating   Compliance with meds-none   Pill box filled-yes  If so, by whom-paramedic   Refills needed-waiting on folic acid       Pt reports she is doing ok. She denies increased sob, no dizziness, no c/p.  Last visit her weight was up and her cardiomems elevated. Med changes were placed per TIna, her weight is the same.  She denies feeling bloated, she reports rt leg is more swollen than the left.  Per tina she needs to do metolazone  for 2 doses this week due to increased volume. And needs BMET next week.  Will go to pharm to p/I that and bring right back.   She is not able to get the dexcom-insurance wont pay since the freestyle sensor was given out this year-per pharmacy   Doc still has not sent in rx of folic acid --she has 3 tabs left.    Lungs wheeze all over.   She used inhaler. Still smoking.    BP (!) 148/60   Pulse 64   Resp 18   Wt 218 lb (98.9 kg)   LMP  (LMP Unknown)   SpO2 98%   BMI 38.62 kg/m  Weight yesterday-218  Last visit weight-218   Patient Care Team: Campbell Reynolds, NP as PCP - [address] Dorn PARAS, MD as PCP - Cardiology (Cardiology) Rolan Ezra GORMAN, MD as PCP - Advanced Heart Failure (Cardiology) Inocencio Soyla Lunger, MD as PCP - Electrophysiology (Cardiology) Court Dorn PARAS, MD as Consulting Physician (Cardiology) Darlean Ozell NOVAK, MD as Consulting Physician (Pulmonary Disease) System, Provider Not In  Patient Active Problem List   Diagnosis Date Noted  . Persistent atrial fibrillation ([name]) 03/15/2024  . Benign neoplasm of ascending colon 03/14/2024  . Benign neoplasm of transverse colon 03/14/2024  . Benign neoplasm of descending colon 03/14/2024  . Benign neoplasm of sigmoid colon 03/14/2024  . Chronic systolic congestive heart failure ([name]) 03/12/2024  . Melena 03/12/2024   . Heme positive stool 03/11/2024  . GI bleed 03/08/2024  . History of CAD (coronary artery disease) 03/08/2024  . CKD (chronic kidney disease), stage IV ([name]) 03/08/2024  . Continuous dependence on cigarette smoking 03/08/2024  . GAD (generalized anxiety disorder) 03/08/2024  . HFrEF (heart failure with reduced ejection fraction) ([name]) 10/11/2023  . 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) and diastolic (congestive) heart failure ([name]) 10/10/2023  . Pre-ulcerative calluses 04/25/2022  . PAF (paroxysmal atrial fibrillation) ([name]) 05/05/2021  . COPD (chronic obstructive pulmonary disease) ([name]) 05/05/2021  . OSA (obstructive sleep apnea) 05/05/2021  . Stage 3b chronic kidney disease ([name]) 05/05/2021  . Pressure injury of skin 05/04/2021  . Diabetic nephropathy ([name]) 01/02/2021  . Low back pain 06/01/2020  . Hyperlipidemia 04/03/2020  . Peripheral artery disease ([name]) 12/26/2019  . NICM (nonischemic cardiomyopathy) ([name]) 06/20/2019  . Non-healing ulcer ([name]) 06/20/2019  . Coagulation disorder ([name]) 08/09/2017  . Depression 07/21/2017  . At risk for adverse drug reaction 06/20/2017  . Peripheral neuropathy 06/20/2017  . S/P transmetatarsal amputation of foot, right ([name]) 06/05/2017  . 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 Obesity, class 2 02/24/2016  . Anticoagulation management encounter 02/10/2016  . Chronic sinus bradycardia 01/12/2016  . Essential hypertension 12/22/2015  . Demand ischemia ([name])   . Acute on chronic diastolic  CHF (congestive heart failure) ([name])   . Symptomatic anemia 11/08/2015  . Hypokalemia 11/08/2015  . Tobacco abuse 10/23/2015  . Coronary artery disease   . DOE (dyspnea on exertion) 04/29/2015  . Paroxysmal atrial fibrillation ([name]) 01/16/2015  . Carotid artery stenosis 01/16/2015  . Insomnia 02/03/2014  . S/P peripheral artery angioplasty - TurboHawk atherectomy; R SFA  09/11/2013    Class: Acute  . Leg pain, bilateral 08/19/2013  . Hypothyroidism 07/31/2013  . History of cocaine abuse ([name]) 06/13/2013  . Long term current use of anticoagulant therapy 05/20/2013  . Alcohol abuse   . Narcotic abuse ([name])   . Marijuana abuse   . Alcoholic cirrhosis ([name])   . Insulin  dependent type 2 diabetes mellitus ([name])     Current Outpatient Medications:  .  acetaminophen  (TYLENOL ) 500 MG tablet, Take 1,000 mg by mouth every 6 (six) hours as needed for headache (pain)., Disp: , Rfl:  .  albuterol  (VENTOLIN  HFA) 108 (90 Base) MCG/ACT inhaler, USE 2 PUFFS BY MOUTH EVERY FOUR HOURS, AS NEEDED, FOR COUGHING/WHEEZING, Disp: 8.5 g, Rfl: 0 .  allopurinol  (ZYLOPRIM ) 100 MG tablet, Take 150 mg by mouth daily., Disp: , Rfl:  .  amiodarone  (PACERONE ) 200 MG tablet, Take 0.5 tablets (100 mg total) by mouth daily., Disp: 30 tablet, Rfl: 1 .  budesonide -formoterol  (SYMBICORT ) 160-4.5 MCG/ACT inhaler, Inhale 2 puffs into the lungs 2 (two) times daily., Disp: 1 each, Rfl: 2 .  buPROPion  (WELLBUTRIN  XL) 300 MG 24 hr tablet, Take 300 mg by mouth every morning., Disp: , Rfl:  .  Cholecalciferol  (VITAMIN D3) 50 MCG (2000 UT) capsule, Take 1 capsule by mouth every morning., Disp: , Rfl:  .  colchicine  0.6 MG tablet, Take 1.2 mg by mouth See admin instructions. Take 1.2 mg for flair up and an hour later take 0.6 mg if needed, Disp: , Rfl:  .  Continuous Glucose Sensor (FREESTYLE LIBRE 3 PLUS SENSOR) MISC, as directed., Disp: , Rfl:  .  docusate sodium  (COLACE) 100 MG capsule, Take 2 capsules (200 mg total) by mouth at bedtime., Disp: 10 capsule, Rfl: 0 .  ELIQUIS  5 MG TABS tablet, TAKE 1 TABLET (5 MG TOTAL) BY MOUTH 2 (TWO) TIMES DAILY., Disp: 60 tablet, Rfl: 6 .  fluticasone  (FLONASE) 50 MCG/ACT nasal spray, Place 1 spray into both nostrils as needed for allergies or rhinitis., Disp: , Rfl:  .  folic acid  (FOLVITE ) 1 MG tablet, Take 1 tablet (1 mg total) by mouth daily., Disp: 30 tablet,  Rfl: 0 .  insulin  lispro (HUMALOG ) 100 UNIT/ML KwikPen, Inject 15 Units into the skin 3 (three) times daily with meals. Plus sliding scale, max dose 70 units /day, Disp: 25 mL, Rfl: 1 .  isosorbide -hydrALAZINE  (BIDIL ) 20-37.5 MG tablet, Take 1 tablet by mouth 3 (three) times daily., Disp: 270 tablet, Rfl: 3 .  LANTUS  SOLOSTAR 100 UNIT/ML Solostar Pen, Inject 30 Units into the skin daily., Disp: 15 mL, Rfl: 0 .  levothyroxine  (SYNTHROID ) 50 MCG tablet, TAKE 1 TABLET (50 MCG TOTAL) BY MOUTH DAILY BEFORE BREAKFAST (AM), Disp: 30 tablet, Rfl: 0 .  metoprolol  succinate (TOPROL -XL) 25 MG 24 hr tablet, Take 1 tablet (25 mg total) by mouth daily., Disp: 90 tablet, Rfl: 3 .  Omega-3 Fatty Acids (FISH OIL PO), Take 1 tablet by mouth daily., Disp: , Rfl:  .  pantoprazole  (PROTONIX ) 40 MG tablet, TAKE 1 TABLET (40 MG TOTAL) BY MOUTH DAILY(AM), Disp: 30 tablet, Rfl: 0 .  tirzepatide  (MOUNJARO ) 7.5 MG/0.5ML  Pen, Inject 7.5 mg into the skin once a week., Disp: 6 mL, Rfl: 1 .  VITAMIN E PO, Take 1 tablet by mouth daily., Disp: , Rfl:  .  Continuous Glucose Receiver (DEXCOM G7 RECEIVER) DEVI, 1 Device by Does not apply route continuous., Disp: 1 each, Rfl: 0 .  Continuous Glucose Sensor (DEXCOM G7 SENSOR) MISC, 1 Device by Does not apply route continuous., Disp: 9 each, Rfl: 3 .  erythromycin ophthalmic ointment, Place 1 Application into the right eye 2 (two) times daily., Disp: , Rfl:  .  gabapentin  (NEURONTIN ) 100 MG capsule, Take 1 capsule (100 mg total) by mouth 3 (three) times daily. (Patient not taking: Reported on 07/01/2024), Disp: 270 capsule, Rfl: 1 .  Glucagon  (BAQSIMI  ONE PACK) 3 MG/DOSE POWD, Place 1 Device into the nose as needed (Low blood sugar with impaired consciousness)., Disp: 2 each, Rfl: 3 .  Menthol -Camphor (ICY HOT PRO NO MESS EX), Apply 1 Application topically 3 (three) times daily as needed (arms, neuropathy lower extrimity, gout in hand.)., Disp: , Rfl:  .  metolazone  (ZAROXOLYN ) 2.5 MG  tablet, Take 1 tablet (2.5 mg total) by mouth every other day., Disp: 2 tablet, Rfl: 0 .  NYAMYC  powder, Apply 1 Application topically 2 (two) times daily., Disp: , Rfl:  .  nystatin  cream (MYCOSTATIN ), Apply 1 Application topically 2 (two) times daily as needed for dry skin., Disp: , Rfl:  .  polyethylene glycol (MIRALAX  / GLYCOLAX ) 17 g packet, Take 17 g by mouth daily as needed for moderate constipation., Disp: 14 each, Rfl: 0 .  potassium chloride  SA (KLOR-CON  M) 20 MEQ tablet, Take 2 tablets (40 mEq total) by mouth 2 (two) times daily. (Patient taking differently: Take 20 mEq by mouth 2 (two) times daily.), Disp: 120 tablet, Rfl: 11 .  rosuvastatin  (CRESTOR ) 40 MG tablet, Take 1 tablet (40 mg total) by mouth daily. (Patient taking differently: Take 40 mg by mouth at bedtime.), Disp: 90 tablet, Rfl: 3 .  torsemide  (DEMADEX ) 20 MG tablet, Take 4 tablets (80 mg total) by mouth 2 (two) times daily. (Patient taking differently: Take 60 mg by mouth 2 (two) times daily.), Disp: , Rfl:  .  triamcinolone  ointment (KENALOG ) 0.1 %, Apply topically 2 (two) times daily., Disp: 454 g, Rfl: 0 Allergies  Allergen Reactions  . Neurontin  [Gabapentin ] Nausea And Vomiting and Other (See Comments)    POSSIBLE SHAKING  . Lyrica  [Pregabalin ] Other (See Comments)    Shaking   . Other     Neurontin        Social History   Socioeconomic History  . Marital status: Single    Spouse name: Not on file  . Number of children: 1  . Years of education: 36  . Highest education level: 12th grade  Occupational History  . Occupation: disabled  Tobacco Use  . Smoking status: Every Day    Current packs/day: 1.00    Average packs/day: 1 pack/day for 44.0 years (44.0 ttl pk-yrs)    Types: E-cigarettes, Cigarettes  . Smokeless tobacco: Former    Types: Snuff  Vaping Use  . Vaping status: Former  . Devices: 11/26/2018 stopped months ago  Substance and Sexual Activity  . Alcohol use: Not Currently    Comment: occ   . Drug use: Yes    Types: Crack cocaine, Marijuana, Oxycodone   . Sexual activity: Not Currently  Other Topics Concern  . Not on file  Social History Narrative   ** Merged History Encounter **  Lives in [place], in motel with sister.  They are looking to move but don't have a place to go yet.     Social Drivers of Health   Financial Resource Strain: Low Risk  (02/09/2023)   Overall Financial Resource Strain (CARDIA)   . Difficulty of Paying Living Expenses: Not hard at all  Food Insecurity: No Food Insecurity (03/09/2024)   Hunger Vital Sign   . Worried About Programme researcher, broadcasting/film/video in the Last Year: Never true   . Ran Out of Food in the Last Year: Never true  Transportation Needs: Unmet Transportation Needs (03/09/2024)   PRAPARE - Transportation   . Lack of Transportation (Medical): Yes   . Lack of Transportation (Non-Medical): Yes  Physical Activity: Inactive (02/09/2023)   Exercise Vital Sign   . Days of Exercise per Week: 0 days   . Minutes of Exercise per Session: 0 min  Stress: No Stress Concern Present (02/09/2023)   [employer] of Occupational Health - Occupational Stress Questionnaire   . Feeling of Stress : Not at all  Social Connections: Unknown (08/21/2023)   Received from [facility]   Social Network   . Social Network: Not on file  Intimate Partner Violence: Not At Risk (03/09/2024)   Humiliation, Afraid, Rape, and Kick questionnaire   . Fear of Current or Ex-Partner: No   . Emotionally Abused: No   . Physically Abused: No   . Sexually Abused: No    Physical Exam      Future Appointments  Date Time Provider Department Center  07/02/2024  9:30 AM Jolinda Dasie KLEIN [facility]  07/09/2024  9:30 AM Jolinda Dasie KLEIN [facility]  07/30/2024  3:10 PM Aniceto Daphne CROME, NP [facility]       [name], Paramedic [phone] [facility] Par[facility]ic  07/01/24

## 2024-07-02 ENCOUNTER — Ambulatory Visit

## 2024-07-02 DIAGNOSIS — R262 Difficulty in walking, not elsewhere classified: Secondary | ICD-10-CM

## 2024-07-02 DIAGNOSIS — Z7409 Other reduced mobility: Secondary | ICD-10-CM

## 2024-07-02 NOTE — Therapy (Signed)
 OUTPATIENT PHYSICAL THERAPY LOWER EXTREMITY TREATMENT    Patient Name: [name] MRN: [id number] DOB:[birth year], 63 y.o., female Today's Date: 07/02/2024  END OF SESSION:  PT End of Session - 07/02/24 0946     Visit Number 5    Number of Visits 12    Date for PT Re-Evaluation 07/05/24    Authorization Type [employer] Dual Complete    PT Start Time 0941   Pt was 11 mins late   PT Stop Time 1015    PT Time Calculation (min) 34 min    Activity Tolerance Patient tolerated treatment well    Behavior During Therapy [facility] for tasks assessed/performed           Past Medical History:  Diagnosis Date   Acute renal failure ([name])    Acute respiratory failure with hypoxia ([name]) 12/02/2019   Alcohol abuse    Alcoholic cirrhosis ([name])    Anemia    Anxiety    Arthritis    knees (11/26/2018)   B12 deficiency    CAD (coronary artery disease)    a. 11/10/2014 Cath: LM nl, LAD min irregs, D1 30 ost, D2 50d, LCX 27m, OM1 80 p/m (1.5 mm vessel), OM2 43m, RCA nondom 62m-->med rx.. Demand ischemia in the setting of rapid a-fib.   Cardiomyopathy ([name])    Carotid artery disease ([name])    a. 01/2015 Carotid Angio: RICA 100, LICA 95p; b. 02/2015 s/p L CEA; c. 05/2019 Carotid U/S: RICA 100. RECA >50. LICA 1-39%.   Cellulitis    lower extremities   Cellulitis in diabetic foot ([name]) 07/08/2019   CHF (congestive heart failure) ([name])    Chronic combined systolic and diastolic CHF (congestive heart failure) ([name])    a. 10/2014 Echo: EF 40-45%; b. 10/2018 Echo: EF 45-50%, gr2 DD; c. 11/2019 Echo: EF 50%, mild LVH, gr2 DD (restrictive), antlat HK, Nl RV fxn. Mild BAE. RVSP 59.29mmHg.   CKD (chronic kidney disease), stage III ([name])    Cocaine abuse ([name])    COPD (chronic obstructive pulmonary disease) ([name])    Critical ischemia of foot ([name]) 06/07/2021   Critical limb ischemia     Critical lower limb ischemia ([name])    Demand ischemia ([name]) 10/29/2014   Depression    Diabetes mellitus without complication  ([name])    Diabetic peripheral neuropathy ([name])    DVT (deep venous thrombosis) ([name])    Dyspnea    Elevated troponin    a. Chronic elevation.   Fall 05/05/2021   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[name])    Peripheral arterial disease/critical limb ischemia     GERD (gastroesophageal reflux disease)    Hyperlipemia    Hypertension    Hypokalemia    Hypomagnesemia    Hypothyroidism    Marijuana abuse    Narcotic abuse ([name])    Noncompliance    NSVT (nonsustained ventricular tachycardia) ([name])    Obesity    Osteomyelitis ([name]) 06/21/2019   PAF (paroxysmal atrial fibrillation) ([name])    Paroxysmal atrial tachycardia ([name])    Peripheral arterial disease ([name])    a. 01/2015 Angio/PTA: RSFA 99 (atherectomy/pta) - 1 vessel runoff via diff dzs peroneal; b. 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   Pneumonia    once or twice (11/26/2018)   Poorly controlled type 2 diabetes mellitus ([name])    Renal disorder    Renal insufficiency    a. 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   SIRS (systemic inflammatory response syndrome) ([name])  04/06/2017   Sleep apnea    couldn't handle wearing the mask (11/26/2018)   Symptomatic bradycardia    a. Avoid AV blocking agent per EP. Prev req temp wire in 2017.   Tobacco abuse    Wrist fracture, left, closed, initial encounter 01/29/2015   Past Surgical History:  Procedure Laterality Date   ABDOMINAL AORTOGRAM N/A 06/26/2019   Procedure: ABDOMINAL AORTOGRAM;  Surgeon: Darron Deatrice LABOR, MD;  Location: [facility] INVASIVE CV LAB;  Service: Cardiovascular;  Laterality: N/A;   ABDOMINAL AORTOGRAM W/LOWER EXTREMITY N/A 06/07/2021   Procedure: ABDOMINAL AORTOGRAM W/LOWER EXTREMITY;  Surgeon: Court Dorn PARAS, MD;  Location: [facility];  Service: Cardiovascular;  Laterality: N/A;   ABDOMINAL AORTOGRAM W/LOWER EXTREMITY N/A 08/11/2023   Procedure: ABDOMINAL AORTOGRAM W/LOWER EXTREMITY;  Surgeon: Magda Debby SAILOR, MD;  Location: [facility];  Service:  Cardiovascular;  Laterality: N/A;   AMPUTATION Right 06/14/2017   Procedure: Right foot transmetatarsal amputation;  Surgeon: Harden Jerona GAILS, MD;  Location: [facility] OR;  Service: Orthopedics;  Laterality: Right;   AMPUTATION Left 06/28/2019   Procedure: AMPUTATION LEFT FIFTH TOE;  Surgeon: Oris Krystal FALCON, MD;  Location: [facility] OR;  Service: Vascular;  Laterality: Left;   AMPUTATION TOE Right 04/28/2017   Procedure: AMPUTATION OF RIGHT SECOND RAY;  Surgeon: Harden Jerona GAILS, MD;  Location: [facility] OR;  Service: Orthopedics;  Laterality: Right;   CARDIAC CATHETERIZATION     CARDIAC CATHETERIZATION N/A 01/12/2016   Procedure: Temporary Wire;  Surgeon: Lynwood Schilling, MD;  Location: [facility];  Service: Cardiovascular;  Laterality: N/A;   CARDIOVERSION  ~ 02/2013   twice    CARDIOVERSION N/A 09/26/2023   Procedure: CARDIOVERSION (CATH LAB);  Surgeon: Alvan Ronal BRAVO, MD;  Location: [facility] INVASIVE CV LAB;  Service: Cardiovascular;  Laterality: N/A;   CAROTID ANGIOGRAM N/A 01/15/2015   Procedure: CAROTID ANGIOGRAM;  Surgeon: Dorn PARAS Court, MD;  Location: [facility] CATH LAB;  Service: Cardiovascular;  Laterality: N/A;   COLONOSCOPY N/A 03/14/2024   Procedure: COLONOSCOPY;  Surgeon: Albertus Gordy HERO, MD;  Location: [facility] ENDOSCOPY;  Service: Gastroenterology;  Laterality: N/A;   DILATION AND CURETTAGE OF UTERUS  1988   ENDARTERECTOMY Left 02/19/2015   Procedure: LEFT CAROTID ENDARTERECTOMY ;  Surgeon: Gaile LELON New, MD;  Location: [facility] OR;  Service: Vascular;  Laterality: Left;   ESOPHAGOGASTRODUODENOSCOPY N/A 03/13/2024   Procedure: EGD (ESOPHAGOGASTRODUODENOSCOPY);  Surgeon: Albertus Gordy HERO, MD;  Location: [facility] ENDOSCOPY;  Service: Gastroenterology;  Laterality: N/A;   FEMORAL-TIBIAL BYPASS GRAFT Left 06/28/2019   Procedure: BYPASS GRAFT LEFT LEG FEMORAL TO ANTERIOR TIBIAL ARTERY using LEFT GREATER SAPHENOUS VEIN;  Surgeon: Oris Krystal FALCON, MD;  Location: [facility] OR;  Service: Vascular;  Laterality: Left;   LEFT HEART CATHETERIZATION WITH  CORONARY ANGIOGRAM N/A 10/31/2014   Procedure: LEFT HEART CATHETERIZATION WITH CORONARY ANGIOGRAM;  Surgeon: Lonni JONETTA Cash, MD;  Location: [facility] CATH LAB;  Service: Cardiovascular;  Laterality: N/A;   LOWER EXTREMITY ANGIOGRAM N/A 09/10/2013   Procedure: LOWER EXTREMITY ANGIOGRAM;  Surgeon: Dorn PARAS Court, MD;  Location: [facility] CATH LAB;  Service: Cardiovascular;  Laterality: N/A;   LOWER EXTREMITY ANGIOGRAM N/A 01/15/2015   Procedure: LOWER EXTREMITY ANGIOGRAM;  Surgeon: Dorn PARAS Court, MD;  Location: [facility] CATH LAB;  Service: Cardiovascular;  Laterality: N/A;   LOWER EXTREMITY ANGIOGRAPHY N/A 04/13/2017   Procedure: Lower Extremity Angiography;  Surgeon: Court Dorn PARAS, MD;  Location: [facility] INVASIVE CV LAB;  Service: Cardiovascular;  Laterality: N/A;   LOWER EXTREMITY  ANGIOGRAPHY Left 06/26/2019   Procedure: LOWER EXTREMITY ANGIOGRAPHY;  Surgeon: Darron Deatrice LABOR, MD;  [facility]: [facility];  Service: Cardiovascular;  Laterality: Left;   PERIPHERAL VASCULAR ATHERECTOMY Right 06/07/2021   Procedure: PERIPHERAL VASCULAR ATHERECTOMY;  Surgeon: Court Dorn PARAS, MD;  Location: [facility] INVASIVE CV LAB;  Service: Cardiovascular;  Laterality: Right;   PERIPHERAL VASCULAR BALLOON ANGIOPLASTY Left 06/26/2019   Procedure: PERIPHERAL VASCULAR BALLOON ANGIOPLASTY;  Surgeon: Darron Deatrice LABOR, MD;  Location: [facility];  Service: Cardiovascular;  Laterality: Left;  unable to cross lt sfa occlusion   PERIPHERAL VASCULAR INTERVENTION  04/13/2017   Procedure: Peripheral Vascular Intervention;  Surgeon: Court Dorn PARAS, MD;  Location: [facility] INVASIVE CV LAB;  Service: Cardiovascular;;   PERIPHERAL VASCULAR INTERVENTION  08/11/2023   Procedure: PERIPHERAL VASCULAR INTERVENTION;  Surgeon: Magda Debby SAILOR, MD;  Location: [facility];  Service: Cardiovascular;;   PRESSURE SENSOR/CARDIOMEMS N/A 02/05/2020   Procedure: PRESSURE SENSOR/CARDIOMEMS;  Surgeon: Rolan Ezra RAMAN, MD;  Location: [facility] INVASIVE CV  LAB;  Service: Cardiovascular;  Laterality: N/A;   VEIN HARVEST Left 06/28/2019   Procedure: LEFT LEG GREATER SAPHENOUS VEIN HARVEST;  Surgeon: Oris Krystal FALCON, MD;  Location: [facility] OR;  Service: Vascular;  Laterality: Left;   Patient Active Problem List   Diagnosis Date Noted   Persistent atrial fibrillation ([name]) 03/15/2024   Benign neoplasm of ascending colon 03/14/2024   Benign neoplasm of transverse colon 03/14/2024   Benign neoplasm of descending colon 03/14/2024   Benign neoplasm of sigmoid colon 03/14/2024   Chronic systolic congestive heart failure ([name]) 03/12/2024   Melena 03/12/2024   Heme positive stool 03/11/2024   GI bleed 03/08/2024   History of CAD (coronary artery disease) 03/08/2024   CKD (chronic kidney disease), stage IV ([name]) 03/08/2024   Continuous dependence on cigarette smoking 03/08/2024   GAD (generalized anxiety disorder) 03/08/2024   HFrEF (heart failure with reduced ejection fraction) ([name]) 10/11/2023   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) and diastolic (congestive) heart failure ([name]) 10/10/2023   Pre-ulcerative calluses 04/25/2022   PAF (paroxysmal atrial fibrillation) ([name]) 05/05/2021   COPD (chronic obstructive pulmonary disease) ([name]) 05/05/2021   OSA (obstructive sleep apnea) 05/05/2021   Stage 3b chronic kidney disease ([name]) 05/05/2021   Pressure injury of skin 05/04/2021   Diabetic nephropathy ([name]) 01/02/2021   Low back pain 06/01/2020   Hyperlipidemia 04/03/2020   Peripheral artery disease ([name]) 12/26/2019   NICM (nonischemic cardiomyopathy) ([name]) 06/20/2019   Non-healing ulcer ([name]) 06/20/2019   Coagulation disorder ([name]) 08/09/2017   Depression 07/21/2017   At risk for adverse drug reaction 06/20/2017   Peripheral neuropathy 06/20/2017   S/P transmetatarsal amputation of foot, right ([name]) 06/05/2017   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) 05/19/2017   Obesity, class 2  02/24/2016   Anticoagulation management encounter 02/10/2016   Chronic sinus bradycardia 01/12/2016   Essential hypertension 12/22/2015   Demand ischemia ([name])    Acute on chronic diastolic CHF (congestive heart failure) ([name])    Symptomatic anemia 11/08/2015   Hypokalemia 11/08/2015   Tobacco abuse 10/23/2015   Coronary artery disease    DOE (dyspnea on exertion) 04/29/2015   Paroxysmal atrial fibrillation ([name]) 01/16/2015   Carotid artery stenosis 01/16/2015   Insomnia 02/03/2014   S/P peripheral artery angioplasty - [place]bo[employer] atherectomy; R SFA 09/11/2013    Class: Acute   Leg pain, bilateral 08/19/2013   Hypothyroidism 07/31/2013   History of cocaine abuse ([name])  06/13/2013   Long term current use of anticoagulant therapy 05/20/2013   Alcohol abuse    Narcotic abuse ([name])    Marijuana abuse    Alcoholic cirrhosis ([name])    Insulin  dependent type 2 diabetes mellitus ([name])     PCP: Campbell Reynolds NP  REFERRING PROVIDER: Magda Ned MD  REFERRING DIAG: weakness and deconditioning  THERAPY DIAG:  Difficulty in walking, not elsewhere classified  Decreased functional mobility and endurance  Rationale for Evaluation and Treatment: Rehabilitation  ONSET DATE: chronic   SUBJECTIVE:   SUBJECTIVE STATEMENT: Pt states she feels like she is walking better.  EVAL: Pt here to work on general mobility following vascular surgery.  Today is the first day she is using her new walker (Rollator).  She needs to go back to the foot doctor for new shoe.  She is difficulty walking due to recent surgeries and deconditioning.  She fell last year because her foot could not there was delayed healing .   2024 she had Stent Rt LE , Bypass LLE.     She does not do much walking. Walks to [place] and the picnic table and back.   PERTINENT HISTORY: Neuropathy severe peripheral arterial disease  73 old female with a history of CHF, diabetes, CAD, LE swelling , Rt and Lt. Foot amputations     PAIN:  Are you having pain? Yes: NPRS scale: 5 Pain location: knees Pain description: Sore  Aggravating factors: walking  Relieving factors: sitting   PRECAUTIONS: Fallweakness, Rt knee   RED FLAGS: None   WEIGHT BEARING RESTRICTIONS: No  FALLS:  Has patient fallen in last 6 months? Yes. Number of falls 3  LIVING ENVIRONMENT: Lives with: lives with their family Lives in: House/apartment Stairs: No Has following equipment at home: Walker - 4 wheeledcane, shower lift   OCCUPATION: not working  PLOF: Independent with basic ADLs, Requires assistive device for independence, Needs assistance with homemaking, and Leisure: mostly sedentary , games on her phone   PATIENT GOALS: I want to be able to walk more   NEXT MD VISIT: as needed   OBJECTIVE:  Note: Objective measures were completed at Evaluation unless otherwise noted.  DIAGNOSTIC FINDINGS: none   PATIENT SURVEYS:  FABQ: Fear-Avoidance Beliefs Questionnaire (FABQ)  Date tested 05/24/24  My pain was caused by physical activity   6   Completely Agree  2. Physical activity makes my pain worse 6   Completely Agree  3. Physical activity might harm my back 0    Completely disagree  4. I should not do physical activities which (might) make my pain worse 6   Completely Agree  5. I cannot do physical activities which (might) make my pain worse 5  6. My pain was caused by work or by an accident at work 0    Completely disagree  7. My work aggravated my pain 0    Completely disagree  8. I have a claim for compensation for my pain 0    Completely disagree  9. My work is too heavy for me 4  10. My work makes or would make my pain worse 4  11. My work might harm my back 4  12. I should not do my normal work with my present pain 6   Completely Agree  13. I cannot do my normal work with my present pain 6   Completely Agree  14. I cannot do my normal work until my pain is treated 0  Completely disagree  15. I do not think that I  will be back to my normal work within 3 months 0    Completely disagree  16. I do not think that I will ever be able to go back to that work 5  Total score  52   Scoring Scale 1: fear-avoidance beliefs about work - items 6,7,9,10,11,12,15 or 16. Add responses, divide by  Scale 2: fear-avoidance beliefs about physical activity - items 2,3,4,5   COGNITION: Overall cognitive status: Within functional limits for tasks assessed     SENSATION: neuropathy  EDEMA:  Swelling present in bilateral lower extremities not triggered   POSTURE: rounded shoulders, forward head, and increased thoracic kyphosis  LOWER EXTREMITY ROM:WFL  LOWER EXTREMITY MMT:  MMT Right eval Left eval  Hip flexion 5 5  Hip extension    Hip abduction    Hip adduction    Hip internal rotation    Hip external rotation    Knee flexion 5 4+   Knee extension 5 5  Ankle dorsiflexion    Ankle plantarflexion    Ankle inversion    Ankle eversion     (Blank rows = not tested)   FUNCTIONAL TESTS:  5 times sit to stand: 15 sec  2 MWT 161 feet 06/27/24: 164 feet in 2 MWT  GAIT: Distance walked: 161 Assistive device utilized: Environmental consultant - 4 wheeled Level of assistance: SBA Comments: needs 1 standing rest break O2 saturation 98% heart rate 96                                                                                                                 TREATMENT DATE:  [facility] Adult PT Treatment:                                                DATE: 07/02/24 Therapeutic Exercise/Activity:  Nustep L5 x 5 minutes  for activity tolerance  Standing heel raise  Standing hip flexion 10 x 2 each 5 # Standing hip abduction  2 x 10 each 5# STS 2 x 10 without UE- RW in front - with intermittent reach  LAQ 5# 10 x 2 each   [facility] Adult PT Treatment:                                                DATE: 06/27/24 Therapeutic Exercise/Activity:  Nustep L4 x 7 minutes  for activity tolerance  2 MWT 164 feet Standing heel raise   Standing hip flexion 10 x 2 each  Standing hip abduction x 10 each  STS 2 x 10 without UE- RW in front - with intermittent reach  LAQ 4# 10 x 2 each  SLR x 10  Supine clam GTB x 20  Bridge 10 x 2  HL ball squeeze 10 x 2  LTR  [facility] Adult PT Treatment:                                                DATE: 06/13/24 Therapeutic Activity: Nustep L5 x 5 mins LAQ 3# 10 x 2  March standing 10 x 2 5# Standing heel/toe raises -min ROM Seated heel/ toe raise  Seated calf stretch with towel Seated h/s stretch- foot on rollator  STS x 10 - rolator in front  HL Ball squeeze x 10  bridge x 10- increases back pain  Supine SLR x 5 each , fatigues  Side clams x 12 each , AROM Gait 109 feet -using clinic RW Gait 76 feet - using clinic RW   PATIENT EDUCATION:  Education details: See above Person educated: Patient Education method: Programmer, multimedia, Demonstration, Verbal cues, and Handouts Education comprehension: verbalized understanding, returned demonstration, and needs further education  HOME EXERCISE PROGRAM: Access Code: [email or web address] URL: [email or web address] Date: 05/24/2024 Prepared by: Delon Norma  Exercises - Sit to Stand with Arm Reach Toward Target  - 1-3 x daily - 7 x weekly - 2-3 sets - 10 reps - 5 hold - Seated Long Arc Quad  - 1-3 x daily - 7 x weekly - 2 sets - 10 reps - 5 hold - Standing March with Counter Support  - 1-3 x daily - 7 x weekly - 2 sets - 10 reps  ASSESSMENT:  CLINICAL IMPRESSION: Pt arrived late for her appt. Pt was encouraged to arrive on time so she can receive the most benefit from her PT sessions. PT was completed for activity tolerance, strengthening, and balance. Pt needed regular sitting rest breaks due to fatigue and R knee and L hip pain. Cueing was provided for slower pacing with her leg exs. With rest breaks, pt tolerated PT today without adverse effects. Will reassess for continuation of PT vs DC her next PT session  EVAL: Patient is a  62y.o. female who was seen today for physical therapy evaluation and treatment for deconditioning and difficulty walking due to previous vascular surgery in 2024.  Since that time the patient has not been able to walk as she was previously.  She is limited by options for foot where her right midfoot amputation poses a fall risk due to improper shoe she plans to get new shoes very soon.  Vital signs were stable during her walk but she does tire quickly.  Patient was given a basic home exercise program and encouraged to take multiple breaks throughout the day from sitting in her recliner to stand do some light walking and improve circulation to her lower body.  OBJECTIVE IMPAIRMENTS: Abnormal gait, cardiopulmonary status limiting activity, decreased activity tolerance, decreased balance, decreased cognition, decreased mobility, difficulty walking, decreased strength, increased edema, increased fascial restrictions, obesity, and pain.   ACTIVITY LIMITATIONS: carrying, lifting, standing, sleeping, stairs, transfers, and locomotion level  PARTICIPATION LIMITATIONS: meal prep, cleaning, laundry, interpersonal relationship, driving, shopping, and community activity  PERSONAL FACTORS: Social background, Time since onset of injury/illness/exacerbation, and 3+ comorbidities: High risk for major lower extremity amputation, congestive heart failure, diabetes, smoking are also affecting patient's functional outcome.   REHAB POTENTIAL: Fair see above comorbidities  CLINICAL DECISION MAKING: Stable/uncomplicated  EVALUATION COMPLEXITY: Low   GOALS: Goals reviewed with patient? Yes  LONG  TERM GOALS: Target date: 07/19/2024   Patient will be able to improve 2 min walk test distance by 50 feet or more with LRAD and no increase in pain.   Baseline: 161 with 1 standing rest break  06/27/24: 164 with 1 standing rest break  Goal status: ONGOING  2.  Patient will be independent with final HEP upon discharge from PT  and report consistent benefit following exercise completion.    Baseline:  Goal status: INITIAL  3.  Pt will be able to demonstrate good standing balance as evidenced by balance recovery from min dynamic balance challenges.   Baseline:  Goal status: INITIAL  4.  Patient will tolerate 15 minutes of cardio to work toward improved activity tolerance  Baseline:  06/27/24: tolerated 10 minutes of combined Nustep and walking  Goal status: ONGOING  5.  Patient will be able to negotiate obstacles with her walker including curbs and inclines/declines with safety.  Baseline:  Goal status: INITIAL  PLAN: Feel like that PT FREQUENCY: 2x/week  PT DURATION: 6 weeks  PLANNED INTERVENTIONS: 97164- PT Re-evaluation, 97750- Physical Performance Testing, 97110-Therapeutic exercises, 97530- Therapeutic activity, 97112- Neuromuscular re-education, 97535- Self Care, [id number]- Manual therapy, [phone]- Gait training, Patient/Family education, Balance training, and DME instructions  PLAN FOR NEXT SESSION: NuStep, review home exercise program get into semireclined position consider adding straight leg raises bridges and outer hip strength balance as tolerated encouraged patient to go to the doctor and get new shoe   [employer] MS, PT 07/02/24 10:14 AM

## 2024-07-02 NOTE — Telephone Encounter (Signed)
 Patient called back in. I reviewed the results with the pt and we discussed the new 4.48mm nodule and the advised action of a 6 month follow up scan. We also discussed the hyperdense area in the pulm vessel resembling a pulmonary embolus. Will hold this information for Lauraine to review. Patient aware we will contact her back if anything needs to be added/changed. 6 month LDCT has not been ordered yet.

## 2024-07-02 NOTE — Telephone Encounter (Signed)
 Called and left VM for pt

## 2024-07-03 ENCOUNTER — Other Ambulatory Visit (HOSPITAL_COMMUNITY): Payer: Self-pay

## 2024-07-03 DIAGNOSIS — I5022 Chronic systolic (congestive) heart failure: Secondary | ICD-10-CM

## 2024-07-04 ENCOUNTER — Telehealth: Payer: Self-pay | Admitting: Acute Care

## 2024-07-04 ENCOUNTER — Telehealth (HOSPITAL_COMMUNITY): Payer: Self-pay

## 2024-07-04 NOTE — Telephone Encounter (Signed)
 Attempted to call patient , there was no answer. I have left our contact information   on her VM, and asked her to return our call. . She is on Eliquis  daily for her A Fib and coagulation disorder, so if she is compliant with her Eliquis  every day a PE would be unlikely. What I need to determine, is if she has missed any doses. If she calls  back, please ask if she has missed a single dose. If  she has missed some Eliquis , we will need to order a CTA Chest to rule out PE. Thanks so much.

## 2024-07-04 NOTE — Telephone Encounter (Signed)
 Called patient no answer she needs to set an appointment at heart failure's lab being that is more convenient than going to [place] like she was going to. Some time next week to get scheduled

## 2024-07-04 NOTE — Telephone Encounter (Signed)
 See provider note 8/21/02025

## 2024-07-05 ENCOUNTER — Telehealth: Payer: Self-pay | Admitting: Acute Care

## 2024-07-05 NOTE — Telephone Encounter (Signed)
 LVM to call office and review results.

## 2024-07-05 NOTE — Telephone Encounter (Signed)
 I have attempted to call the patient with the results of their  Low Dose CT Chest Lung cancer screening scan. There was no answer. I have left a HIPPA compliant VM requesting the patient call the office for the scan results. I included the office contact information in the message. We will await his return call. If no return call we will continue to call until patient is contacted.    Ladies, please let me know if she returns the call. Ask if she has missed any doses of Eliquis . Thanks so much

## 2024-07-08 ENCOUNTER — Other Ambulatory Visit (HOSPITAL_COMMUNITY): Payer: Self-pay

## 2024-07-08 NOTE — Telephone Encounter (Signed)
 See provider note 07/04/2024 for additional outreaches.

## 2024-07-08 NOTE — Telephone Encounter (Signed)
 LVM to call office and review results.

## 2024-07-08 NOTE — Progress Notes (Unsigned)
 Paramedicine Encounter    Patient ID: [name], female    DOB: 06/29/1961, 63 y.o.   MRN: [id number]   Complaints-foot pain   Edema-yes   Compliance with meds-yes   Pill box filled-yes  If so, by whom-paramedic   Refills needed-none -still waiting on folic acid   Pt reports since she took that metolazone  last week she urinated a lot and feels better. Her weight is still up though but her cardiomems yesterday was 21--goal is 20.  She denies increased sob, she still seems bloated. No dizziness, no c/p.  She had issues with her freestyle meter not registering her sensors- meter was saying no found sensor so we replaced that one. It scanned it and said for it to start.   Med verified and pill box refilled.   CBG PTA-269 BP 122/60   Pulse 64   Resp 18   Wt 219 lb (99.3 kg)   LMP  (LMP Unknown)   SpO2 98%   BMI 38.79 kg/m  Weight yesterday-220  Last visit weight-218   Patient Care Team: Campbell Reynolds, NP as PCP - General Court Dorn PARAS, MD as PCP - Cardiology (Cardiology) Rolan Ezra GORMAN, MD as PCP - Advanced Heart Failure (Cardiology) Inocencio Soyla Lunger, MD as PCP - Electrophysiology (Cardiology) Court Dorn PARAS, MD as Consulting Physician (Cardiology) Darlean Ozell NOVAK, MD as Consulting Physician (Pulmonary Disease) System, Provider Not In  Patient Active Problem List   Diagnosis Date Noted   Persistent atrial fibrillation ([name]) 03/15/2024   Benign neoplasm of ascending colon 03/14/2024   Benign neoplasm of transverse colon 03/14/2024   Benign neoplasm of descending colon 03/14/2024   Benign neoplasm of sigmoid colon 03/14/2024   Chronic systolic congestive heart failure ([name]) 03/12/2024   Melena 03/12/2024   Heme positive stool 03/11/2024   GI bleed 03/08/2024   History of CAD (coronary artery disease) 03/08/2024   CKD (chronic kidney disease), stage IV ([name]) 03/08/2024   Continuous dependence on cigarette smoking 03/08/2024   GAD (generalized  anxiety disorder) 03/08/2024   HFrEF (heart failure with reduced ejection fraction) ([name]) 10/11/2023   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) and diastolic (congestive) heart failure ([name]) 10/10/2023   Pre-ulcerative calluses 04/25/2022   PAF (paroxysmal atrial fibrillation) ([name]) 05/05/2021   COPD (chronic obstructive pulmonary disease) ([name]) 05/05/2021   OSA (obstructive sleep apnea) 05/05/2021   Stage 3b chronic kidney disease ([name]) 05/05/2021   Pressure injury of skin 05/04/2021   Diabetic nephropathy ([name]) 01/02/2021   Low back pain 06/01/2020   Hyperlipidemia 04/03/2020   Peripheral artery disease ([name]) 12/26/2019   NICM (nonischemic cardiomyopathy) ([name]) 06/20/2019   Non-healing ulcer ([name]) 06/20/2019   Coagulation disorder ([name]) 08/09/2017   Depression 07/21/2017   At risk for adverse drug reaction 06/20/2017   Peripheral neuropathy 06/20/2017   S/P transmetatarsal amputation of foot, right ([name]) 06/05/2017   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) 05/19/2017   Obesity, class 2 02/24/2016   Anticoagulation management encounter 02/10/2016   Chronic sinus bradycardia 01/12/2016   Essential hypertension 12/22/2015   Demand ischemia ([name])    Acute on chronic diastolic CHF (congestive heart failure) ([name])    Symptomatic anemia 11/08/2015   Hypokalemia 11/08/2015   Tobacco abuse 10/23/2015   Coronary artery disease    DOE (dyspnea on exertion) 04/29/2015   Paroxysmal atrial fibrillation ([name]) 01/16/2015   Carotid artery stenosis 01/16/2015   Insomnia 02/03/2014   S/P peripheral artery angioplasty -  TurboHawk atherectomy; R SFA 09/11/2013    Class: Acute   Leg pain, bilateral 08/19/2013   Hypothyroidism 07/31/2013   History of cocaine abuse ([name]) 06/13/2013   Long term current use of anticoagulant therapy 05/20/2013   Alcohol abuse    Narcotic abuse ([name])    Marijuana abuse    Alcoholic cirrhosis ([name])     Insulin  dependent type 2 diabetes mellitus ([name])     Current Outpatient Medications:    acetaminophen  (TYLENOL ) 500 MG tablet, Take 1,000 mg by mouth every 6 (six) hours as needed for headache (pain)., Disp: , Rfl:    albuterol  (VENTOLIN  HFA) 108 (90 Base) MCG/ACT inhaler, USE 2 PUFFS BY MOUTH EVERY FOUR HOURS, AS NEEDED, FOR COUGHING/WHEEZING, Disp: 8.5 g, Rfl: 0   allopurinol  (ZYLOPRIM ) 100 MG tablet, Take 150 mg by mouth daily., Disp: , Rfl:    amiodarone  (PACERONE ) 200 MG tablet, Take 0.5 tablets (100 mg total) by mouth daily., Disp: 30 tablet, Rfl: 1   budesonide -formoterol  (SYMBICORT ) 160-4.5 MCG/ACT inhaler, Inhale 2 puffs into the lungs 2 (two) times daily., Disp: 1 each, Rfl: 2   buPROPion  (WELLBUTRIN  XL) 300 MG 24 hr tablet, Take 300 mg by mouth every morning., Disp: , Rfl:    Cholecalciferol  (VITAMIN D3) 50 MCG (2000 UT) capsule, Take 1 capsule by mouth every morning., Disp: , Rfl:    colchicine  0.6 MG tablet, Take 1.2 mg by mouth See admin instructions. Take 1.2 mg for flair up and an hour later take 0.6 mg if needed, Disp: , Rfl:    Continuous Glucose Sensor (FREESTYLE LIBRE 3 PLUS SENSOR) MISC, as directed., Disp: , Rfl:    ELIQUIS  5 MG TABS tablet, TAKE 1 TABLET (5 MG TOTAL) BY MOUTH 2 (TWO) TIMES DAILY., Disp: 60 tablet, Rfl: 6   fluticasone  (FLONASE) 50 MCG/ACT nasal spray, Place 1 spray into both nostrils as needed for allergies or rhinitis., Disp: , Rfl:    insulin  lispro (HUMALOG ) 100 UNIT/ML KwikPen, Inject 15 Units into the skin 3 (three) times daily with meals. Plus sliding scale, max dose 70 units /day, Disp: 25 mL, Rfl: 1   isosorbide -hydrALAZINE  (BIDIL ) 20-37.5 MG tablet, Take 1 tablet by mouth 3 (three) times daily., Disp: 270 tablet, Rfl: 3   LANTUS  SOLOSTAR 100 UNIT/ML Solostar Pen, Inject 30 Units into the skin daily., Disp: 15 mL, Rfl: 0   levothyroxine  (SYNTHROID ) 50 MCG tablet, TAKE 1 TABLET (50 MCG TOTAL) BY MOUTH DAILY BEFORE BREAKFAST (AM), Disp: 30 tablet,  Rfl: 0   Menthol -Camphor (ICY HOT PRO NO MESS EX), Apply 1 Application topically 3 (three) times daily as needed (arms, neuropathy lower extrimity, gout in hand.)., Disp: , Rfl:    metoprolol  succinate (TOPROL -XL) 25 MG 24 hr tablet, Take 1 tablet (25 mg total) by mouth daily., Disp: 90 tablet, Rfl: 3   Omega-3 Fatty Acids (FISH OIL PO), Take 1 tablet by mouth daily., Disp: , Rfl:    pantoprazole  (PROTONIX ) 40 MG tablet, TAKE 1 TABLET (40 MG TOTAL) BY MOUTH DAILY(AM), Disp: 30 tablet, Rfl: 0   VITAMIN E PO, Take 1 tablet by mouth daily., Disp: , Rfl:    Continuous Glucose Receiver (DEXCOM G7 RECEIVER) DEVI, 1 Device by Does not apply route continuous., Disp: 1 each, Rfl: 0   Continuous Glucose Sensor (DEXCOM G7 SENSOR) MISC, 1 Device by Does not apply route continuous., Disp: 9 each, Rfl: 3   docusate sodium  (COLACE) 100 MG capsule, Take 2 capsules (200 mg total) by mouth at bedtime., Disp:  10 capsule, Rfl: 0   erythromycin ophthalmic ointment, Place 1 Application into the right eye 2 (two) times daily., Disp: , Rfl:    folic acid  (FOLVITE ) 1 MG tablet, Take 1 tablet (1 mg total) by mouth daily., Disp: 30 tablet, Rfl: 0   gabapentin  (NEURONTIN ) 100 MG capsule, Take 1 capsule (100 mg total) by mouth 3 (three) times daily. (Patient not taking: Reported on 07/01/2024), Disp: 270 capsule, Rfl: 1   Glucagon  (BAQSIMI  ONE PACK) 3 MG/DOSE POWD, Place 1 Device into the nose as needed (Low blood sugar with impaired consciousness)., Disp: 2 each, Rfl: 3   metolazone  (ZAROXOLYN ) 2.5 MG tablet, Take 1 tablet (2.5 mg total) by mouth every other day., Disp: 2 tablet, Rfl: 0   NYAMYC  powder, Apply 1 Application topically 2 (two) times daily., Disp: , Rfl:    nystatin  cream (MYCOSTATIN ), Apply 1 Application topically 2 (two) times daily as needed for dry skin., Disp: , Rfl:    polyethylene glycol (MIRALAX  / GLYCOLAX ) 17 g packet, Take 17 g by mouth daily as needed for moderate constipation., Disp: 14 each, Rfl: 0    potassium chloride  SA (KLOR-CON  M) 20 MEQ tablet, Take 2 tablets (40 mEq total) by mouth 2 (two) times daily. (Patient taking differently: Take 20 mEq by mouth 2 (two) times daily.), Disp: 120 tablet, Rfl: 11   rosuvastatin  (CRESTOR ) 40 MG tablet, Take 1 tablet (40 mg total) by mouth daily. (Patient taking differently: Take 40 mg by mouth at bedtime.), Disp: 90 tablet, Rfl: 3   tirzepatide  (MOUNJARO ) 7.5 MG/0.5ML Pen, Inject 7.5 mg into the skin once a week., Disp: 6 mL, Rfl: 1   torsemide  (DEMADEX ) 20 MG tablet, Take 4 tablets (80 mg total) by mouth 2 (two) times daily. (Patient taking differently: Take 60 mg by mouth 2 (two) times daily.), Disp: , Rfl:    triamcinolone  ointment (KENALOG ) 0.1 %, Apply topically 2 (two) times daily., Disp: 454 g, Rfl: 0 Allergies  Allergen Reactions   Neurontin  [Gabapentin ] Nausea And Vomiting and Other (See Comments)    POSSIBLE SHAKING   Lyrica  [Pregabalin ] Other (See Comments)    Shaking    Other     Neurontin        Social History   Socioeconomic History   Marital status: Single    Spouse name: Not on file   Number of children: 1   Years of education: 12   Highest education level: 12th grade  Occupational History   Occupation: disabled  Tobacco Use   Smoking status: Every Day    Current packs/day: 1.00    Average packs/day: 1 pack/day for 44.0 years (44.0 ttl pk-yrs)    Types: E-cigarettes, Cigarettes   Smokeless tobacco: Former    Types: Snuff  Vaping Use   Vaping status: Former   Devices: 11/26/2018 stopped months ago  Substance and Sexual Activity   Alcohol use: Not Currently    Comment: occ   Drug use: Yes    Types: Crack cocaine, Marijuana, Oxycodone    Sexual activity: Not Currently  Other Topics Concern   Not on file  Social History Narrative   ** Merged History Encounter **       Lives in [place], in motel with sister.  They are looking to move but don't have a place to go yet.     Social Drivers of Health   Financial  Resource Strain: Low Risk  (02/09/2023)   Overall Financial Resource Strain (CARDIA)    Difficulty of Paying Living  Expenses: Not hard at all  Food Insecurity: No Food Insecurity (03/09/2024)   Hunger Vital Sign    Worried About Running Out of Food in the Last Year: Never true    Ran Out of Food in the Last Year: Never true  Transportation Needs: Unmet Transportation Needs (03/09/2024)   PRAPARE - Transportation    Lack of Transportation (Medical): Yes    Lack of Transportation (Non-Medical): Yes  Physical Activity: Inactive (02/09/2023)   Exercise Vital Sign    Days of Exercise per Week: 0 days    Minutes of Exercise per Session: 0 min  Stress: No Stress Concern Present (02/09/2023)   [employer] of Occupational Health - Occupational Stress Questionnaire    Feeling of Stress : Not at all  Social Connections: Unknown (08/21/2023)   Received from [facility]   Social Network    Social Network: Not on file  Intimate Partner Violence: Not At Risk (03/09/2024)   Humiliation, Afraid, Rape, and Kick questionnaire    Fear of Current or Ex-Partner: No    Emotionally Abused: No    Physically Abused: No    Sexually Abused: No    Physical Exam      Future Appointments  Date Time Provider Department Center  07/30/2024  3:10 PM Aniceto Daphne CROME, NP [facility]       [name], Paramedic [phone] [facility] Paramedic  07/10/24

## 2024-07-08 NOTE — Therapy (Incomplete)
 OUTPATIENT PHYSICAL THERAPY LOWER EXTREMITY TREATMENT    Patient Name: [name] MRN: [id number] DOB:July 18, 1961, 63 y.o., female Today's Date: 07/08/2024  END OF SESSION:     Past Medical History:  Diagnosis Date   Acute renal failure ([name])    Acute respiratory failure with hypoxia ([name]) 12/02/2019   Alcohol abuse    Alcoholic cirrhosis ([name])    Anemia    Anxiety    Arthritis    knees (11/26/2018)   B12 deficiency    CAD (coronary artery disease)    a. 11/10/2014 Cath: LM nl, LAD min irregs, D1 30 ost, D2 50d, LCX 65m, OM1 80 p/m (1.5 mm vessel), OM2 47m, RCA nondom 74m-->med rx.. Demand ischemia in the setting of rapid a-fib.   Cardiomyopathy ([name])    Carotid artery disease ([name])    a. 01/2015 Carotid Angio: RICA 100, LICA 95p; b. 02/2015 s/p L CEA; c. 05/2019 Carotid U/S: RICA 100. RECA >50. LICA 1-39%.   Cellulitis    lower extremities   Cellulitis in diabetic foot ([name]) 07/08/2019   CHF (congestive heart failure) ([name])    Chronic combined systolic and diastolic CHF (congestive heart failure) ([name])    a. 10/2014 Echo: EF 40-45%; b. 10/2018 Echo: EF 45-50%, gr2 DD; c. 11/2019 Echo: EF 50%, mild LVH, gr2 DD (restrictive), antlat HK, Nl RV fxn. Mild BAE. RVSP 59.43mmHg.   CKD (chronic kidney disease), stage III ([name])    Cocaine abuse ([name])    COPD (chronic obstructive pulmonary disease) ([name])    Critical ischemia of foot ([name]) 06/07/2021   Critical limb ischemia     Critical lower limb ischemia ([name])    Demand ischemia ([name]) 10/29/2014   Depression    Diabetes mellitus without complication ([name])    Diabetic peripheral neuropathy ([name])    DVT (deep venous thrombosis) ([name])    Dyspnea    Elevated troponin    a. Chronic elevation.   Fall 05/05/2021   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[name])    Peripheral arterial disease/critical limb ischemia     GERD (gastroesophageal reflux disease)    Hyperlipemia    Hypertension    Hypokalemia    Hypomagnesemia     Hypothyroidism    Marijuana abuse    Narcotic abuse ([name])    Noncompliance    NSVT (nonsustained ventricular tachycardia) ([name])    Obesity    Osteomyelitis ([name]) 06/21/2019   PAF (paroxysmal atrial fibrillation) ([name])    Paroxysmal atrial tachycardia ([name])    Peripheral arterial disease ([name])    a. 01/2015 Angio/PTA: RSFA 99 (atherectomy/pta) - 1 vessel runoff via diff dzs peroneal; b. 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   Pneumonia    once or twice (11/26/2018)   Poorly controlled type 2 diabetes mellitus ([name])    Renal disorder    Renal insufficiency    a. 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   SIRS (systemic inflammatory response syndrome) ([name]) 04/06/2017   Sleep apnea    couldn't handle wearing the mask (11/26/2018)   Symptomatic bradycardia    a. Avoid AV blocking agent per EP. Prev req temp wire in 2017.   Tobacco abuse    Wrist fracture, left, closed, initial encounter 01/29/2015   Past Surgical History:  Procedure Laterality Date   ABDOMINAL AORTOGRAM N/A 06/26/2019   Procedure: ABDOMINAL AORTOGRAM;  Surgeon: Darron Deatrice LABOR, MD;  Location: [facility] INVASIVE CV LAB;  Service: Cardiovascular;  Laterality: N/A;   ABDOMINAL AORTOGRAM W/LOWER EXTREMITY  N/A 06/07/2021   Procedure: ABDOMINAL AORTOGRAM W/LOWER EXTREMITY;  Surgeon: Court Dorn PARAS, MD;  Location: [facility] INVASIVE CV LAB;  Service: Cardiovascular;  Laterality: N/A;   ABDOMINAL AORTOGRAM W/LOWER EXTREMITY N/A 08/11/2023   Procedure: ABDOMINAL AORTOGRAM W/LOWER EXTREMITY;  Surgeon: Magda Debby SAILOR, MD;  Location: [facility] INVASIVE CV LAB;  Service: Cardiovascular;  Laterality: N/A;   AMPUTATION Right 06/14/2017   Procedure: Right foot transmetatarsal amputation;  Surgeon: Harden Jerona GAILS, MD;  Location: [facility] OR;  Service: Orthopedics;  Laterality: Right;   AMPUTATION Left 06/28/2019   Procedure: AMPUTATION LEFT FIFTH TOE;  Surgeon: Oris Krystal FALCON, MD;  Location: [facility] OR;  Service: Vascular;  Laterality: Left;   AMPUTATION TOE  Right 04/28/2017   Procedure: AMPUTATION OF RIGHT SECOND RAY;  Surgeon: Harden Jerona GAILS, MD;  Location: [facility] OR;  Service: Orthopedics;  Laterality: Right;   CARDIAC CATHETERIZATION     CARDIAC CATHETERIZATION N/A 01/12/2016   Procedure: Temporary Wire;  Surgeon: Lynwood Schilling, MD;  Location: [facility] LAB;  Service: Cardiovascular;  Laterality: N/A;   CARDIOVERSION  ~ 02/2013   twice    CARDIOVERSION N/A 09/26/2023   Procedure: CARDIOVERSION (CATH LAB);  Surgeon: Alvan Ronal BRAVO, MD;  Location: [facility] INVASIVE CV LAB;  Service: Cardiovascular;  Laterality: N/A;   CAROTID ANGIOGRAM N/A 01/15/2015   Procedure: CAROTID ANGIOGRAM;  Surgeon: Dorn PARAS Court, MD;  Location: [facility] CATH LAB;  Service: Cardiovascular;  Laterality: N/A;   COLONOSCOPY N/A 03/14/2024   Procedure: COLONOSCOPY;  Surgeon: Albertus Gordy HERO, MD;  Location: [facility] ENDOSCOPY;  Service: Gastroenterology;  Laterality: N/A;   DILATION AND CURETTAGE OF UTERUS  1988   ENDARTERECTOMY Left 02/19/2015   Procedure: LEFT CAROTID ENDARTERECTOMY ;  Surgeon: Gaile LELON New, MD;  Location: [facility] OR;  Service: Vascular;  Laterality: Left;   ESOPHAGOGASTRODUODENOSCOPY N/A 03/13/2024   Procedure: EGD (ESOPHAGOGASTRODUODENOSCOPY);  Surgeon: Albertus Gordy HERO, MD;  Location: [facility] ENDOSCOPY;  Service: Gastroenterology;  Laterality: N/A;   FEMORAL-TIBIAL BYPASS GRAFT Left 06/28/2019   Procedure: BYPASS GRAFT LEFT LEG FEMORAL TO ANTERIOR TIBIAL ARTERY using LEFT GREATER SAPHENOUS VEIN;  Surgeon: Oris Krystal FALCON, MD;  Location: [facility] OR;  Service: Vascular;  Laterality: Left;   LEFT HEART CATHETERIZATION WITH CORONARY ANGIOGRAM N/A 10/31/2014   Procedure: LEFT HEART CATHETERIZATION WITH CORONARY ANGIOGRAM;  Surgeon: Lonni JONETTA Cash, MD;  Location: [facility] CATH LAB;  Service: Cardiovascular;  Laterality: N/A;   LOWER EXTREMITY ANGIOGRAM N/A 09/10/2013   Procedure: LOWER EXTREMITY ANGIOGRAM;  Surgeon: Dorn PARAS Court, MD;  Location: [facility] CATH LAB;  Service: Cardiovascular;   Laterality: N/A;   LOWER EXTREMITY ANGIOGRAM N/A 01/15/2015   Procedure: LOWER EXTREMITY ANGIOGRAM;  Surgeon: Dorn PARAS Court, MD;  Location: [facility] CATH LAB;  Service: Cardiovascular;  Laterality: N/A;   LOWER EXTREMITY ANGIOGRAPHY N/A 04/13/2017   Procedure: Lower Extremity Angiography;  Surgeon: Court Dorn PARAS, MD;  Location: [facility] INVASIVE CV LAB;  Service: Cardiovascular;  Laterality: N/A;   LOWER EXTREMITY ANGIOGRAPHY Left 06/26/2019   Procedure: LOWER EXTREMITY ANGIOGRAPHY;  Surgeon: Darron Deatrice LABOR, MD;  Location: [facility] CV LAB;  Service: Cardiovascular;  Laterality: Left;   PERIPHERAL VASCULAR ATHERECTOMY Right 06/07/2021   Procedure: PERIPHERAL VASCULAR ATHERECTOMY;  Surgeon: Court Dorn PARAS, MD;  Location: [facility] INVASIVE CV LAB;  Service: Cardiovascular;  Laterality: Right;   PERIPHERAL VASCULAR BALLOON ANGIOPLASTY Left 06/26/2019   Procedure: PERIPHERAL VASCULAR BALLOON ANGIOPLASTY;  Surgeon: Darron Deatrice LABOR, MD;  Location: [facility]VAS[facility] CV LAB;  Service: Cardiovascular;  Laterality: Left;  unable to cross lt sfa occlusion   PERIPHERAL VASCULAR INTERVENTION  04/13/2017   Procedure: Peripheral Vascular Intervention;  Surgeon: Court Dorn PARAS, MD;  Location: [facility] INVASIVE CV LAB;  Service: Cardiovascular;;   PERIPHERAL VASCULAR INTERVENTION  08/11/2023   Procedure: PERIPHERAL VASCULAR INTERVENTION;  Surgeon: Magda Debby SAILOR, MD;  Location: [facility] INVASIVE CV LAB;  Service: Cardiovascular;;   PRESSURE SENSOR/CARDIOMEMS N/A 02/05/2020   Procedure: PRESSURE SENSOR/CARDIOMEMS;  Surgeon: Rolan Ezra RAMAN, MD;  Location: [facility] INVASIVE CV LAB;  Service: Cardiovascular;  Laterality: N/A;   VEIN HARVEST Left 06/28/2019   Procedure: LEFT LEG GREATER SAPHENOUS VEIN HARVEST;  Surgeon: Oris Krystal FALCON, MD;  Location: [facility] OR;  Service: Vascular;  Laterality: Left;   Patient Active Problem List   Diagnosis Date Noted   Persistent atrial fibrillation ([name]) 03/15/2024   Benign neoplasm of ascending colon 03/14/2024    Benign neoplasm of transverse colon 03/14/2024   Benign neoplasm of descending colon 03/14/2024   Benign neoplasm of sigmoid colon 03/14/2024   Chronic systolic congestive heart failure ([name]) 03/12/2024   Melena 03/12/2024   Heme positive stool 03/11/2024   GI bleed 03/08/2024   History of CAD (coronary artery disease) 03/08/2024   CKD (chronic kidney disease), stage IV ([name]) 03/08/2024   Continuous dependence on cigarette smoking 03/08/2024   GAD (generalized anxiety disorder) 03/08/2024   HFrEF (heart failure with reduced ejection fraction) ([name]) 10/11/2023   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) and diastolic (congestive) heart failure ([name]) 10/10/2023   Pre-ulcerative calluses 04/25/2022   PAF (paroxysmal atrial fibrillation) ([name]) 05/05/2021   COPD (chronic obstructive pulmonary disease) ([name]) 05/05/2021   OSA (obstructive sleep apnea) 05/05/2021   Stage 3b chronic kidney disease ([name]) 05/05/2021   Pressure injury of skin 05/04/2021   Diabetic nephropathy ([name]) 01/02/2021   Low back pain 06/01/2020   Hyperlipidemia 04/03/2020   Peripheral artery disease ([name]) 12/26/2019   NICM (nonischemic cardiomyopathy) ([name]) 06/20/2019   Non-healing ulcer ([name]) 06/20/2019   Coagulation disorder ([name]) 08/09/2017   Depression 07/21/2017   At risk for adverse drug reaction 06/20/2017   Peripheral neuropathy 06/20/2017   S/P transmetatarsal amputation of foot, right ([name]) 06/05/2017   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) 05/19/2017   Obesity, class 2 02/24/2016   Anticoagulation management encounter 02/10/2016   Chronic sinus bradycardia 01/12/2016   Essential hypertension 12/22/2015   Demand ischemia ([name])    Acute on chronic diastolic CHF (congestive heart failure) ([name])    Symptomatic anemia 11/08/2015   Hypokalemia 11/08/2015   Tobacco abuse 10/23/2015   Coronary artery disease    DOE (dyspnea on exertion)  04/29/2015   Paroxysmal atrial fibrillation ([name]) 01/16/2015   Carotid artery stenosis 01/16/2015   Insomnia 02/03/2014   S/P peripheral artery angioplasty - [place]bo[employer] atherectomy; R SFA 09/11/2013    Class: Acute   Leg pain, bilateral 08/19/2013   Hypothyroidism 07/31/2013   History of cocaine abuse ([name]) 06/13/2013   Long term current use of anticoagulant therapy 05/20/2013   Alcohol abuse    Narcotic abuse ([name])    Marijuana abuse    Alcoholic cirrhosis ([name])    Insulin  dependent type 2 diabetes mellitus ([name])     PCP: Campbell Reynolds NP  REFERRING PROVIDER: Magda Debby MD  REFERRING DIAG: weakness and deconditioning  THERAPY DIAG:  No diagnosis found.  Rationale for Evaluation and Treatment: Rehabilitation  ONSET DATE: chronic   SUBJECTIVE:   SUBJECTIVE STATEMENT: Pt states she feels like  she is walking better.  EVAL: Pt here to work on general mobility following vascular surgery.  Today is the first day she is using her new walker (Rollator).  She needs to go back to the foot doctor for new shoe.  She is difficulty walking due to recent surgeries and deconditioning.  She fell last year because her foot could not there was delayed healing .   2024 she had Stent Rt LE , Bypass LLE.     She does not do much walking. Walks to [place] and the picnic table and back.   PERTINENT HISTORY: Neuropathy severe peripheral arterial disease  75 old female with a history of CHF, diabetes, CAD, LE swelling , Rt and Lt. Foot amputations    PAIN:  Are you having pain? Yes: NPRS scale: 5 Pain location: knees Pain description: Sore  Aggravating factors: walking  Relieving factors: sitting   PRECAUTIONS: Fallweakness, Rt knee   RED FLAGS: None   WEIGHT BEARING RESTRICTIONS: No  FALLS:  Has patient fallen in last 6 months? Yes. Number of falls 3  LIVING ENVIRONMENT: Lives with: lives with their family Lives in: House/apartment Stairs: No Has following equipment at home:  Walker - 4 wheeledcane, shower lift   OCCUPATION: not working  PLOF: Independent with basic ADLs, Requires assistive device for independence, Needs assistance with homemaking, and Leisure: mostly sedentary , games on her phone   PATIENT GOALS: I want to be able to walk more   NEXT MD VISIT: as needed   OBJECTIVE:  Note: Objective measures were completed at Evaluation unless otherwise noted.  DIAGNOSTIC FINDINGS: none   PATIENT SURVEYS:  FABQ: Fear-Avoidance Beliefs Questionnaire (FABQ)  Date tested 05/24/24  My pain was caused by physical activity   6   Completely Agree  2. Physical activity makes my pain worse 6   Completely Agree  3. Physical activity might harm my back 0    Completely disagree  4. I should not do physical activities which (might) make my pain worse 6   Completely Agree  5. I cannot do physical activities which (might) make my pain worse 5  6. My pain was caused by work or by an accident at work 0    Completely disagree  7. My work aggravated my pain 0    Completely disagree  8. I have a claim for compensation for my pain 0    Completely disagree  9. My work is too heavy for me 4  10. My work makes or would make my pain worse 4  11. My work might harm my back 4  12. I should not do my normal work with my present pain 6   Completely Agree  13. I cannot do my normal work with my present pain 6   Completely Agree  14. I cannot do my normal work until my pain is treated 0    Completely disagree  15. I do not think that I will be back to my normal work within 3 months 0    Completely disagree  16. I do not think that I will ever be able to go back to that work 5  Total score  52   Scoring Scale 1: fear-avoidance beliefs about work - items 6,7,9,10,11,12,15 or 16. Add responses, divide by  Scale 2: fear-avoidance beliefs about physical activity - items 2,3,4,5   COGNITION: Overall cognitive status: Within functional limits for tasks  assessed     SENSATION: neuropathy  EDEMA:  Swelling present in bilateral lower extremities not triggered   POSTURE: rounded shoulders, forward head, and increased thoracic kyphosis  LOWER EXTREMITY ROM:WFL  LOWER EXTREMITY MMT:  MMT Right eval Left eval  Hip flexion 5 5  Hip extension    Hip abduction    Hip adduction    Hip internal rotation    Hip external rotation    Knee flexion 5 4+   Knee extension 5 5  Ankle dorsiflexion    Ankle plantarflexion    Ankle inversion    Ankle eversion     (Blank rows = not tested)   FUNCTIONAL TESTS:  5 times sit to stand: 15 sec  2 MWT 161 feet 06/27/24: 164 feet in 2 MWT  GAIT: Distance walked: 161 Assistive device utilized: Environmental consultant - 4 wheeled Level of assistance: SBA Comments: needs 1 standing rest break O2 saturation 98% heart rate 96                                                                                                                 TREATMENT DATE:  [facility] Adult PT Treatment:                                                DATE: 07/09/24 Therapeutic Exercise/Activity:  Nustep L5 x 5 minutes  for activity tolerance  Standing heel raise  Standing hip flexion 10 x 2 each 5 # Standing hip abduction  2 x 10 each 5# STS 2 x 10 without UE- RW in front - with intermittent reach  LAQ 5# 10 x 2 each  Therapeutic Exercise: *** Manual Therapy: *** Neuromuscular re-ed: *** Therapeutic Activity: *** Modalities: *** Self Care: ***  [facility] Adult PT Treatment:                                                DATE: 07/02/24 Therapeutic Exercise/Activity:  Nustep L5 x 5 minutes  for activity tolerance  Standing heel raise  Standing hip flexion 10 x 2 each 5 # Standing hip abduction  2 x 10 each 5# STS 2 x 10 without UE- RW in front - with intermittent reach  LAQ 5# 10 x 2 each   [facility] Adult PT Treatment:                                                DATE: 06/27/24 Therapeutic Exercise/Activity:  Nustep L4 x 7 minutes   for activity tolerance  2 MWT 164 feet Standing heel raise  Standing hip flexion 10 x 2 each  Standing hip abduction x 10 each  STS 2 x 10  without UE- RW in front - with intermittent reach  LAQ 4# 10 x 2 each  SLR x 10  Supine clam GTB x 20  Bridge 10 x 2  HL ball squeeze 10 x 2  LTR  [facility] Adult PT Treatment:                                                DATE: 06/13/24 Therapeutic Activity: Nustep L5 x 5 mins LAQ 3# 10 x 2  March standing 10 x 2 5# Standing heel/toe raises -min ROM Seated heel/ toe raise  Seated calf stretch with towel Seated h/s stretch- foot on rollator  STS x 10 - rolator in front  HL Ball squeeze x 10  bridge x 10- increases back pain  Supine SLR x 5 each , fatigues  Side clams x 12 each , AROM Gait 109 feet -using clinic RW Gait 76 feet - using clinic RW   PATIENT EDUCATION:  Education details: See above Person educated: Patient Education method: Programmer, multimedia, Demonstration, Verbal cues, and Handouts Education comprehension: verbalized understanding, returned demonstration, and needs further education  HOME EXERCISE PROGRAM: Access Code: [email or web address] URL: [email or web address] Date: 05/24/2024 Prepared by: Delon Norma  Exercises - Sit to Stand with Arm Reach Toward Target  - 1-3 x daily - 7 x weekly - 2-3 sets - 10 reps - 5 hold - Seated Long Arc Quad  - 1-3 x daily - 7 x weekly - 2 sets - 10 reps - 5 hold - Standing March with Counter Support  - 1-3 x daily - 7 x weekly - 2 sets - 10 reps  ASSESSMENT:  CLINICAL IMPRESSION: Pt arrived late for her appt. Pt was encouraged to arrive on time so she can receive the most benefit from her PT sessions. PT was completed for activity tolerance, strengthening, and balance. Pt needed regular sitting rest breaks due to fatigue and R knee and L hip pain. Cueing was provided for slower pacing with her leg exs. With rest breaks, pt tolerated PT today without adverse effects. Will reassess for  continuation of PT vs DC her next PT session  EVAL: Patient is a 62y.o. female who was seen today for physical therapy evaluation and treatment for deconditioning and difficulty walking due to previous vascular surgery in 2024.  Since that time the patient has not been able to walk as she was previously.  She is limited by options for foot where her right midfoot amputation poses a fall risk due to improper shoe she plans to get new shoes very soon.  Vital signs were stable during her walk but she does tire quickly.  Patient was given a basic home exercise program and encouraged to take multiple breaks throughout the day from sitting in her recliner to stand do some light walking and improve circulation to her lower body.  OBJECTIVE IMPAIRMENTS: Abnormal gait, cardiopulmonary status limiting activity, decreased activity tolerance, decreased balance, decreased cognition, decreased mobility, difficulty walking, decreased strength, increased edema, increased fascial restrictions, obesity, and pain.   ACTIVITY LIMITATIONS: carrying, lifting, standing, sleeping, stairs, transfers, and locomotion level  PARTICIPATION LIMITATIONS: meal prep, cleaning, laundry, interpersonal relationship, driving, shopping, and community activity  PERSONAL FACTORS: Social background, Time since onset of injury/illness/exacerbation, and 3+ comorbidities: High risk for major lower extremity amputation, congestive heart failure, diabetes, smoking are also affecting patient's functional outcome.  REHAB POTENTIAL: Fair see above comorbidities  CLINICAL DECISION MAKING: Stable/uncomplicated  EVALUATION COMPLEXITY: Low   GOALS: Goals reviewed with patient? Yes  LONG TERM GOALS: Target date: 07/19/2024   Patient will be able to improve 2 min walk test distance by 50 feet or more with LRAD and no increase in pain.   Baseline: 161 with 1 standing rest break  06/27/24: 164 with 1 standing rest break  Goal status: ONGOING  2.   Patient will be independent with final HEP upon discharge from PT and report consistent benefit following exercise completion.    Baseline:  Goal status: INITIAL  3.  Pt will be able to demonstrate good standing balance as evidenced by balance recovery from min dynamic balance challenges.   Baseline:  Goal status: INITIAL  4.  Patient will tolerate 15 minutes of cardio to work toward improved activity tolerance  Baseline:  06/27/24: tolerated 10 minutes of combined Nustep and walking  Goal status: ONGOING  5.  Patient will be able to negotiate obstacles with her walker including curbs and inclines/declines with safety.  Baseline:  Goal status: INITIAL  PLAN: Feel like that PT FREQUENCY: 2x/week  PT DURATION: 6 weeks  PLANNED INTERVENTIONS: 97164- PT Re-evaluation, 97750- Physical Performance Testing, 97110-Therapeutic exercises, 97530- Therapeutic activity, 97112- Neuromuscular re-education, 97535- Self Care, [id number]- Manual therapy, [phone]- Gait training, Patient/Family education, Balance training, and DME instructions  PLAN FOR NEXT SESSION: NuStep, review home exercise program get into semireclined position consider adding straight leg raises bridges and outer hip strength balance as tolerated encouraged patient to go to the doctor and get new shoe   [employer] MS, PT 07/08/24 3:18 PM

## 2024-07-09 ENCOUNTER — Ambulatory Visit

## 2024-07-10 ENCOUNTER — Other Ambulatory Visit (HOSPITAL_COMMUNITY): Payer: Self-pay

## 2024-07-10 NOTE — Progress Notes (Signed)
 Paramedicine Encounter    Patient ID: [name], female    DOB: 1961/06/23, 63 y.o.   MRN: [id number]  Came out for med rec, I had forgotten that Monday was a holiday when I seen her the other day and had to come back today to top off her pill box so she wont go without meds.  So that was done.  Also got her labs sch for next week.       Patient Care Team: Campbell Reynolds, NP as PCP - General Court Dorn PARAS, MD as PCP - Cardiology (Cardiology) Karla Ezra GORMAN, MD as PCP - Advanced Heart Failure (Cardiology) Inocencio Soyla Lunger, MD as PCP - Electrophysiology (Cardiology) Court Dorn PARAS, MD as Consulting Physician (Cardiology) Darlean Ozell NOVAK, MD as Consulting Physician (Pulmonary Disease) System, Provider Not In  Patient Active Problem List   Diagnosis Date Noted   Persistent atrial fibrillation ([name]) 03/15/2024   Benign neoplasm of ascending colon 03/14/2024   Benign neoplasm of transverse colon 03/14/2024   Benign neoplasm of descending colon 03/14/2024   Benign neoplasm of sigmoid colon 03/14/2024   Chronic systolic congestive heart failure ([name]) 03/12/2024   Melena 03/12/2024   Heme positive stool 03/11/2024   GI bleed 03/08/2024   History of CAD (coronary artery disease) 03/08/2024   CKD (chronic kidney disease), stage IV ([name]) 03/08/2024   Continuous dependence on cigarette smoking 03/08/2024   GAD (generalized anxiety disorder) 03/08/2024   HFrEF (heart failure with reduced ejection fraction) ([name]) 10/11/2023   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) and diastolic (congestive) heart failure ([name]) 10/10/2023   Pre-ulcerative calluses 04/25/2022   PAF (paroxysmal atrial fibrillation) ([name]) 05/05/2021   COPD (chronic obstructive pulmonary disease) ([name]) 05/05/2021   OSA (obstructive sleep apnea) 05/05/2021   Stage 3b chronic kidney disease ([name]) 05/05/2021   Pressure injury of skin 05/04/2021   Diabetic nephropathy ([name]) 01/02/2021   Low back pain  06/01/2020   Hyperlipidemia 04/03/2020   Peripheral artery disease ([name]) 12/26/2019   NICM (nonischemic cardiomyopathy) ([name]) 06/20/2019   Non-healing ulcer ([name]) 06/20/2019   Coagulation disorder ([name]) 08/09/2017   Depression 07/21/2017   At risk for adverse drug reaction 06/20/2017   Peripheral neuropathy 06/20/2017   S/P transmetatarsal amputation of foot, right ([name]) 06/05/2017   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) 05/19/2017   Obesity, class 2 02/24/2016   Anticoagulation management encounter 02/10/2016   Chronic sinus bradycardia 01/12/2016   Essential hypertension 12/22/2015   Demand ischemia ([name])    Acute on chronic diastolic CHF (congestive heart failure) ([name])    Symptomatic anemia 11/08/2015   Hypokalemia 11/08/2015   Tobacco abuse 10/23/2015   Coronary artery disease    DOE (dyspnea on exertion) 04/29/2015   Paroxysmal atrial fibrillation ([name]) 01/16/2015   Carotid artery stenosis 01/16/2015   Insomnia 02/03/2014   S/P peripheral artery angioplasty - TurboHawk atherectomy; R SFA 09/11/2013    Class: Acute   Leg pain, bilateral 08/19/2013   Hypothyroidism 07/31/2013   History of cocaine abuse ([name]) 06/13/2013   Long term current use of anticoagulant therapy 05/20/2013   Alcohol abuse    Narcotic abuse ([name])    Marijuana abuse    Alcoholic cirrhosis ([name])    Insulin  dependent type 2 diabetes mellitus ([name])     Current Outpatient Medications:    acetaminophen  (TYLENOL ) 500 MG tablet, Take 1,000 mg by mouth every 6 (six) hours as needed for headache (pain)., Disp: , Rfl:  albuterol  (VENTOLIN  HFA) 108 (90 Base) MCG/ACT inhaler, USE 2 PUFFS BY MOUTH EVERY FOUR HOURS, AS NEEDED, FOR COUGHING/WHEEZING, Disp: 8.5 g, Rfl: 0   allopurinol  (ZYLOPRIM ) 100 MG tablet, Take 150 mg by mouth daily., Disp: , Rfl:    amiodarone  (PACERONE ) 200 MG tablet, Take 0.5 tablets (100 mg total) by mouth daily., Disp: 30 tablet,  Rfl: 1   budesonide -formoterol  (SYMBICORT ) 160-4.5 MCG/ACT inhaler, Inhale 2 puffs into the lungs 2 (two) times daily., Disp: 1 each, Rfl: 2   buPROPion  (WELLBUTRIN  XL) 300 MG 24 hr tablet, Take 300 mg by mouth every morning., Disp: , Rfl:    Cholecalciferol  (VITAMIN D3) 50 MCG (2000 UT) capsule, Take 1 capsule by mouth every morning., Disp: , Rfl:    colchicine  0.6 MG tablet, Take 1.2 mg by mouth See admin instructions. Take 1.2 mg for flair up and an hour later take 0.6 mg if needed, Disp: , Rfl:    Continuous Glucose Receiver (DEXCOM G7 RECEIVER) DEVI, 1 Device by Does not apply route continuous., Disp: 1 each, Rfl: 0   Continuous Glucose Sensor (DEXCOM G7 SENSOR) MISC, 1 Device by Does not apply route continuous., Disp: 9 each, Rfl: 3   Continuous Glucose Sensor (FREESTYLE LIBRE 3 PLUS SENSOR) MISC, as directed., Disp: , Rfl:    docusate sodium  (COLACE) 100 MG capsule, Take 2 capsules (200 mg total) by mouth at bedtime., Disp: 10 capsule, Rfl: 0   ELIQUIS  5 MG TABS tablet, TAKE 1 TABLET (5 MG TOTAL) BY MOUTH 2 (TWO) TIMES DAILY., Disp: 60 tablet, Rfl: 6   erythromycin ophthalmic ointment, Place 1 Application into the right eye 2 (two) times daily., Disp: , Rfl:    fluticasone  (FLONASE) 50 MCG/ACT nasal spray, Place 1 spray into both nostrils as needed for allergies or rhinitis., Disp: , Rfl:    folic acid  (FOLVITE ) 1 MG tablet, Take 1 tablet (1 mg total) by mouth daily., Disp: 30 tablet, Rfl: 0   gabapentin  (NEURONTIN ) 100 MG capsule, Take 1 capsule (100 mg total) by mouth 3 (three) times daily. (Patient not taking: Reported on 07/01/2024), Disp: 270 capsule, Rfl: 1   Glucagon  (BAQSIMI  ONE PACK) 3 MG/DOSE POWD, Place 1 Device into the nose as needed (Low blood sugar with impaired consciousness)., Disp: 2 each, Rfl: 3   insulin  lispro (HUMALOG ) 100 UNIT/ML KwikPen, Inject 15 Units into the skin 3 (three) times daily with meals. Plus sliding scale, max dose 70 units /day, Disp: 25 mL, Rfl: 1    isosorbide -hydrALAZINE  (BIDIL ) 20-37.5 MG tablet, Take 1 tablet by mouth 3 (three) times daily., Disp: 270 tablet, Rfl: 3   LANTUS  SOLOSTAR 100 UNIT/ML Solostar Pen, Inject 30 Units into the skin daily., Disp: 15 mL, Rfl: 0   levothyroxine  (SYNTHROID ) 50 MCG tablet, TAKE 1 TABLET (50 MCG TOTAL) BY MOUTH DAILY BEFORE BREAKFAST (AM), Disp: 30 tablet, Rfl: 0   Menthol -Camphor (ICY HOT PRO NO MESS EX), Apply 1 Application topically 3 (three) times daily as needed (arms, neuropathy lower extrimity, gout in hand.)., Disp: , Rfl:    metolazone  (ZAROXOLYN ) 2.5 MG tablet, Take 1 tablet (2.5 mg total) by mouth every other day., Disp: 2 tablet, Rfl: 0   metoprolol  succinate (TOPROL -XL) 25 MG 24 hr tablet, Take 1 tablet (25 mg total) by mouth daily., Disp: 90 tablet, Rfl: 3   NYAMYC  powder, Apply 1 Application topically 2 (two) times daily., Disp: , Rfl:    nystatin  cream (MYCOSTATIN ), Apply 1 Application topically 2 (two) times daily as needed  for dry skin., Disp: , Rfl:    Omega-3 Fatty Acids (FISH OIL PO), Take 1 tablet by mouth daily., Disp: , Rfl:    pantoprazole  (PROTONIX ) 40 MG tablet, TAKE 1 TABLET (40 MG TOTAL) BY MOUTH DAILY(AM), Disp: 30 tablet, Rfl: 0   polyethylene glycol (MIRALAX  / GLYCOLAX ) 17 g packet, Take 17 g by mouth daily as needed for moderate constipation., Disp: 14 each, Rfl: 0   potassium chloride  SA (KLOR-CON  M) 20 MEQ tablet, Take 2 tablets (40 mEq total) by mouth 2 (two) times daily. (Patient taking differently: Take 20 mEq by mouth 2 (two) times daily.), Disp: 120 tablet, Rfl: 11   rosuvastatin  (CRESTOR ) 40 MG tablet, Take 1 tablet (40 mg total) by mouth daily. (Patient taking differently: Take 40 mg by mouth at bedtime.), Disp: 90 tablet, Rfl: 3   tirzepatide  (MOUNJARO ) 7.5 MG/0.5ML Pen, Inject 7.5 mg into the skin once a week., Disp: 6 mL, Rfl: 1   torsemide  (DEMADEX ) 20 MG tablet, Take 4 tablets (80 mg total) by mouth 2 (two) times daily. (Patient taking differently: Take 60 mg by  mouth 2 (two) times daily.), Disp: , Rfl:    triamcinolone  ointment (KENALOG ) 0.1 %, Apply topically 2 (two) times daily., Disp: 454 g, Rfl: 0   VITAMIN E PO, Take 1 tablet by mouth daily., Disp: , Rfl:  Allergies  Allergen Reactions   Neurontin  [Gabapentin ] Nausea And Vomiting and Other (See Comments)    POSSIBLE SHAKING   Lyrica  [Pregabalin ] Other (See Comments)    Shaking    Other     Neurontin        Social History   Socioeconomic History   Marital status: Single    Spouse name: Not on file   Number of children: 1   Years of education: 12   Highest education level: 12th grade  Occupational History   Occupation: disabled  Tobacco Use   Smoking status: Every Day    Current packs/day: 1.00    Average packs/day: 1 pack/day for 44.0 years (44.0 ttl pk-yrs)    Types: E-cigarettes, Cigarettes   Smokeless tobacco: Former    Types: Snuff  Vaping Use   Vaping status: Former   Devices: 11/26/2018 stopped months ago  Substance and Sexual Activity   Alcohol use: Not Currently    Comment: occ   Drug use: Yes    Types: Crack cocaine, Marijuana, Oxycodone    Sexual activity: Not Currently  Other Topics Concern   Not on file  Social History Narrative   ** Merged History Encounter **       Lives in [place], in motel with sister.  They are looking to move but don't have a place to go yet.     Social Drivers of Corporate investment banker Strain: Low Risk  (02/09/2023)   Overall Financial Resource Strain (CARDIA)    Difficulty of Paying Living Expenses: Not hard at all  Food Insecurity: No Food Insecurity (03/09/2024)   Hunger Vital Sign    Worried About Running Out of Food in the Last Year: Never true    Ran Out of Food in the Last Year: Never true  Transportation Needs: Unmet Transportation Needs (03/09/2024)   PRAPARE - Transportation    Lack of Transportation (Medical): Yes    Lack of Transportation (Non-Medical): Yes  Physical Activity: Inactive (02/09/2023)   Exercise  Vital Sign    Days of Exercise per Week: 0 days    Minutes of Exercise per Session: 0 min  Stress: No Stress Concern Present (02/09/2023)   [employer] of Occupational Health - Occupational Stress Questionnaire    Feeling of Stress : Not at all  Social Connections: Unknown (08/21/2023)   Received from [facility]   Social Network    Social Network: Not on file  Intimate Partner Violence: Not At Risk (03/09/2024)   Humiliation, Afraid, Rape, and Kick questionnaire    Fear of Current or Ex-Partner: No    Emotionally Abused: No    Physically Abused: No    Sexually Abused: No    Physical Exam      Future Appointments  Date Time Provider Department Center  07/16/2024 12:00 PM [facility] None  07/30/2024  3:10 PM Aniceto Daphne CROME, NP [facility]       [name], Paramedic [phone] [facility] Paramedic  07/10/24

## 2024-07-11 ENCOUNTER — Other Ambulatory Visit: Payer: Self-pay

## 2024-07-11 DIAGNOSIS — Z122 Encounter for screening for malignant neoplasm of respiratory organs: Secondary | ICD-10-CM

## 2024-07-11 DIAGNOSIS — Z87891 Personal history of nicotine dependence: Secondary | ICD-10-CM

## 2024-07-11 DIAGNOSIS — F17210 Nicotine dependence, cigarettes, uncomplicated: Secondary | ICD-10-CM

## 2024-07-11 DIAGNOSIS — R911 Solitary pulmonary nodule: Secondary | ICD-10-CM

## 2024-07-11 NOTE — Telephone Encounter (Signed)
 Copied from CRM [id number]. Topic: Clinical - Lab/Test Results >> Jul 11, 2024  8:48 AM [name] PARAS wrote: Reason for CRM:   Pt is returning call from clinic regarding her CT of chest results.   Requests call back  CB#  [phone]

## 2024-07-16 ENCOUNTER — Ambulatory Visit (HOSPITAL_COMMUNITY)
Admission: RE | Admit: 2024-07-16 | Discharge: 2024-07-16 | Disposition: A | Discharge status: Home / Self Care | Source: Ambulatory Visit | Attending: Cardiology | Admitting: Cardiology

## 2024-07-16 ENCOUNTER — Ambulatory Visit (HOSPITAL_COMMUNITY): Payer: Self-pay | Admitting: Cardiology

## 2024-07-16 DIAGNOSIS — I5022 Chronic systolic (congestive) heart failure: Secondary | ICD-10-CM

## 2024-07-16 LAB — BASIC METABOLIC PANEL WITH GFR
Anion gap: 13 (ref 5–15)
BUN: 73 mg/dL — ABNORMAL HIGH (ref 8–23)
CO2: 25 mmol/L (ref 22–32)
Calcium: 9.1 mg/dL (ref 8.9–10.3)
Chloride: 103 mmol/L (ref 98–111)
Creatinine, Ser: 3.1 mg/dL — ABNORMAL HIGH (ref 0.44–1.00)
GFR, Estimated: 16 mL/min — ABNORMAL LOW
Glucose, Bld: 191 mg/dL — ABNORMAL HIGH (ref 70–99)
Potassium: 4.6 mmol/L (ref 3.5–5.1)
Sodium: 141 mmol/L (ref 135–145)

## 2024-07-16 NOTE — Progress Notes (Signed)
 Chief Complaint  Patient presents with   Nail Problem    Pt is here for [facility].    SUBJECTIVE Patient with a history of diabetes mellitus presents to office today complaining of elongated, thickened nails that cause pain while ambulating in shoes.  Patient is unable to trim their own nails. Patient is here for further evaluation and treatment.  Past Medical History:  Diagnosis Date   Acute renal failure ([name])    Acute respiratory failure with hypoxia ([name]) 12/02/2019   Alcohol abuse    Alcoholic cirrhosis ([name])    Anemia    Anxiety    Arthritis    knees (11/26/2018)   B12 deficiency    CAD (coronary artery disease)    a. 11/10/2014 Cath: LM nl, LAD min irregs, D1 30 ost, D2 50d, LCX 36m, OM1 80 p/m (1.5 mm vessel), OM2 22m, RCA nondom 31m-->med rx.. Demand ischemia in the setting of rapid a-fib.   Cardiomyopathy ([name])    Carotid artery disease ([name])    a. 01/2015 Carotid Angio: RICA 100, LICA 95p; b. 02/2015 s/p L CEA; c. 05/2019 Carotid U/S: RICA 100. RECA >50. LICA 1-39%.   Cellulitis    lower extremities   Cellulitis in diabetic foot ([name]) 07/08/2019   CHF (congestive heart failure) ([name])    Chronic combined systolic and diastolic CHF (congestive heart failure) ([name])    a. 10/2014 Echo: EF 40-45%; b. 10/2018 Echo: EF 45-50%, gr2 DD; c. 11/2019 Echo: EF 50%, mild LVH, gr2 DD (restrictive), antlat HK, Nl RV fxn. Mild BAE. RVSP 59.22mmHg.   CKD (chronic kidney disease), stage III ([name])    Cocaine abuse ([name])    COPD (chronic obstructive pulmonary disease) ([name])    Critical ischemia of foot ([name]) 06/07/2021   Critical limb ischemia     Critical lower limb ischemia ([name])    Demand ischemia ([name]) 10/29/2014   Depression    Diabetes mellitus without complication ([name])    Diabetic peripheral neuropathy ([name])    DVT (deep venous thrombosis) ([name])    Dyspnea    Elevated troponin    a. Chronic elevation.   Fall 05/05/2021   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[name])    Peripheral  arterial disease/critical limb ischemia     GERD (gastroesophageal reflux disease)    Hyperlipemia    Hypertension    Hypokalemia    Hypomagnesemia    Hypothyroidism    Marijuana abuse    Narcotic abuse ([name])    Noncompliance    NSVT (nonsustained ventricular tachycardia) ([name])    Obesity    Osteomyelitis ([name]) 06/21/2019   PAF (paroxysmal atrial fibrillation) ([name])    Paroxysmal atrial tachycardia ([name])    Peripheral arterial disease ([name])    a. 01/2015 Angio/PTA: RSFA 99 (atherectomy/pta) - 1 vessel runoff via diff dzs peroneal; b. 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   Pneumonia    once or twice (11/26/2018)   Poorly controlled type 2 diabetes mellitus ([name])    Renal disorder    Renal insufficiency    a. 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   SIRS (systemic inflammatory response syndrome) ([name]) 04/06/2017   Sleep apnea    couldn't handle wearing the mask (11/26/2018)   Symptomatic bradycardia    a. Avoid AV blocking agent per EP. Prev req temp wire in 2017.   Tobacco abuse    Wrist fracture, left, closed, initial encounter 01/29/2015    Allergies  Allergen Reactions   Neurontin  [Gabapentin ]  Nausea And Vomiting and Other (See Comments)    POSSIBLE SHAKING   Lyrica  [Pregabalin ] Other (See Comments)    Shaking    Other     Neurontin       OBJECTIVE General Patient is awake, alert, and oriented x 3 and in no acute distress. Derm Skin is dry and supple bilateral. Negative open lesions or macerations. Remaining integument unremarkable. Nails are tender, long, thickened and dystrophic with subungual debris, consistent with onychomycosis, 1-5 bilateral. No signs of infection noted. Vasc  DP and PT pedal pulses palpable bilaterally. Temperature gradient within normal limits.  Neuro light touch and protective threshold sensation diminished bilaterally.  Musculoskeletal Exam No symptomatic pedal deformities noted bilateral. Muscular strength within normal  limits.  ASSESSMENT 1. Diabetes Mellitus w/ peripheral neuropathy 2.  Pain due to onychomycosis of toenails bilateral  PLAN OF CARE -Patient evaluated today. -Instructed to maintain good pedal hygiene and foot care. Stressed importance of controlling blood sugar.  -Mechanical debridement of nails 1-5 bilaterally performed using a nail nipper. Filed with dremel without incident.  -return to clinic in 3 mos.     Thresa EMERSON Sar, DPM [facility]  Dr. Thresa EMERSON Sar, DPM    [address] [place], [place] [address]                Office [phone]  Fax [phone]

## 2024-07-17 ENCOUNTER — Other Ambulatory Visit (HOSPITAL_COMMUNITY): Payer: Self-pay

## 2024-07-17 MED ORDER — POTASSIUM CHLORIDE CRYS ER 20 MEQ PO TBCR: 20.0000 meq | EXTENDED_RELEASE_TABLET | Freq: Two times a day (BID) | ORAL | Status: DC

## 2024-07-17 MED ORDER — TORSEMIDE 20 MG PO TABS: 40.0000 mg | ORAL_TABLET | Freq: Two times a day (BID) | ORAL | Status: DC

## 2024-07-17 NOTE — Progress Notes (Signed)
 Paramedicine Encounter    Patient ID: [name], female    DOB: 1961/07/15, 63 y.o.   MRN: [id number]   Complaints-none today   Edema-chronic swelling but mch improved  Compliance with meds-yes  Pill box filled-yes  If so, by whom-paramedic   Refills needed-  Allopurinol  Isosorbide   Eliquis  Rosuvastatin  Colchicine   Amiodarone   Folic acid  >>>these were ordered    Pt reports she is doing ok. Last night she had rt sided shoulder pain and when she had that pain she had sharp pain in her leg too. She denies increased sob, no dizziness, no c/p.  She thinks the pain is arthritis or gout.  Taking her meds.  Yesterday she had labs done and per [place] she needs to hold torsemide  for 2 days and then resume at 40mg  BID and get repeat bmet in 7-10 days and sch 3 mth f/u.  I had talked to her yesterday and she had already taken both doses of her torsemide  so I told her I would be out first thing this morning and not to take any meds. She only took her thyroid  pill this morning.   I forwarded triage and scheduling this message in hopes to get these done quicker than the last time.  Advised her to reach out to me if she notices her weight increasing or she gets bloated or feels more sob. With her she is difficult to keep balanced on her fluid.  She also reported diarrhea last week for a couple days so that added on her dehydration.  Encouraged her to continue to use the cardiomems each day.    Meds verified and pill box refilled.  Still waiting on her pcp to send in rx of folic acid .     CBG PTA-228  BP (!) 140/56   Pulse 60   Resp 17   Wt 216 lb (98 kg)   LMP  (LMP Unknown)   SpO2 98%   BMI 38.26 kg/m  Weight yesterday-? Last visit weight-219   Patient Care Team: Campbell Reynolds, NP as PCP - [address] Court Dorn PARAS, MD as PCP - Cardiology (Cardiology) Rolan Ezra GORMAN, MD as PCP - Advanced Heart Failure (Cardiology) Inocencio Soyla Lunger, MD as PCP - Electrophysiology  (Cardiology) Court Dorn PARAS, MD as Consulting Physician (Cardiology) Darlean Ozell NOVAK, MD as Consulting Physician (Pulmonary Disease) System, Provider Not In  Patient Active Problem List   Diagnosis Date Noted   Persistent atrial fibrillation ([name]) 03/15/2024   Benign neoplasm of ascending colon 03/14/2024   Benign neoplasm of transverse colon 03/14/2024   Benign neoplasm of descending colon 03/14/2024   Benign neoplasm of sigmoid colon 03/14/2024   Chronic systolic congestive heart failure ([name]) 03/12/2024   Melena 03/12/2024   Heme positive stool 03/11/2024   GI bleed 03/08/2024   History of CAD (coronary artery disease) 03/08/2024   CKD (chronic kidney disease), stage IV ([name]) 03/08/2024   Continuous dependence on cigarette smoking 03/08/2024   GAD (generalized anxiety disorder) 03/08/2024   HFrEF (heart failure with reduced ejection fraction) ([name]) 10/11/2023   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) and diastolic (congestive) heart failure ([name]) 10/10/2023   Pre-ulcerative calluses 04/25/2022   PAF (paroxysmal atrial fibrillation) ([name]) 05/05/2021   COPD (chronic obstructive pulmonary disease) ([name]) 05/05/2021   OSA (obstructive sleep apnea) 05/05/2021   Stage 3b chronic kidney disease ([name]) 05/05/2021   Pressure injury of skin 05/04/2021   Diabetic nephropathy ([name]) 01/02/2021   Low back pain 06/01/2020   Hyperlipidemia  04/03/2020   Peripheral artery disease ([name]) 12/26/2019   NICM (nonischemic cardiomyopathy) ([name]) 06/20/2019   Non-healing ulcer ([name]) 06/20/2019   Coagulation disorder ([name]) 08/09/2017   Depression 07/21/2017   At risk for adverse drug reaction 06/20/2017   Peripheral neuropathy 06/20/2017   S/P transmetatarsal amputation of foot, right ([name]) 06/05/2017   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) 05/19/2017   Obesity, class 2 02/24/2016   Anticoagulation management encounter 02/10/2016    Chronic sinus bradycardia 01/12/2016   Essential hypertension 12/22/2015   Demand ischemia ([name])    Acute on chronic diastolic CHF (congestive heart failure) ([name])    Symptomatic anemia 11/08/2015   Hypokalemia 11/08/2015   Tobacco abuse 10/23/2015   Coronary artery disease    DOE (dyspnea on exertion) 04/29/2015   Paroxysmal atrial fibrillation ([name]) 01/16/2015   Carotid artery stenosis 01/16/2015   Insomnia 02/03/2014   S/P peripheral artery angioplasty - TurboHawk atherectomy; R SFA 09/11/2013    Class: Acute   Leg pain, bilateral 08/19/2013   Hypothyroidism 07/31/2013   History of cocaine abuse ([name]) 06/13/2013   Long term current use of anticoagulant therapy 05/20/2013   Alcohol abuse    Narcotic abuse ([name])    Marijuana abuse    Alcoholic cirrhosis ([name])    Insulin  dependent type 2 diabetes mellitus ([name])     Current Outpatient Medications:    acetaminophen  (TYLENOL ) 500 MG tablet, Take 1,000 mg by mouth every 6 (six) hours as needed for headache (pain)., Disp: , Rfl:    albuterol  (VENTOLIN  HFA) 108 (90 Base) MCG/ACT inhaler, USE 2 PUFFS BY MOUTH EVERY FOUR HOURS, AS NEEDED, FOR COUGHING/WHEEZING, Disp: 8.5 g, Rfl: 0   allopurinol  (ZYLOPRIM ) 100 MG tablet, Take 150 mg by mouth daily., Disp: , Rfl:    amiodarone  (PACERONE ) 200 MG tablet, Take 0.5 tablets (100 mg total) by mouth daily., Disp: 30 tablet, Rfl: 1   budesonide -formoterol  (SYMBICORT ) 160-4.5 MCG/ACT inhaler, Inhale 2 puffs into the lungs 2 (two) times daily., Disp: 1 each, Rfl: 2   buPROPion  (WELLBUTRIN  XL) 300 MG 24 hr tablet, Take 300 mg by mouth every morning., Disp: , Rfl:    Cholecalciferol  (VITAMIN D3) 50 MCG (2000 UT) capsule, Take 1 capsule by mouth every morning., Disp: , Rfl:    colchicine  0.6 MG tablet, Take 1.2 mg by mouth See admin instructions. Take 1.2 mg for flair up and an hour later take 0.6 mg if needed, Disp: , Rfl:    Continuous Glucose Sensor (FREESTYLE LIBRE 3 PLUS SENSOR) MISC, as  directed., Disp: , Rfl:    ELIQUIS  5 MG TABS tablet, TAKE 1 TABLET (5 MG TOTAL) BY MOUTH 2 (TWO) TIMES DAILY., Disp: 60 tablet, Rfl: 6   fluticasone  (FLONASE) 50 MCG/ACT nasal spray, Place 1 spray into both nostrils as needed for allergies or rhinitis., Disp: , Rfl:    folic acid  (FOLVITE ) 1 MG tablet, Take 1 tablet (1 mg total) by mouth daily., Disp: 30 tablet, Rfl: 0   insulin  lispro (HUMALOG ) 100 UNIT/ML KwikPen, Inject 15 Units into the skin 3 (three) times daily with meals. Plus sliding scale, max dose 70 units /day, Disp: 25 mL, Rfl: 1   isosorbide -hydrALAZINE  (BIDIL ) 20-37.5 MG tablet, Take 1 tablet by mouth 3 (three) times daily., Disp: 270 tablet, Rfl: 3   LANTUS  SOLOSTAR 100 UNIT/ML Solostar Pen, Inject 30 Units into the skin daily., Disp: 15 mL, Rfl: 0   levothyroxine  (SYNTHROID ) 50 MCG tablet, TAKE 1 TABLET (  50 MCG TOTAL) BY MOUTH DAILY BEFORE BREAKFAST (AM), Disp: 30 tablet, Rfl: 0   metoprolol  succinate (TOPROL -XL) 25 MG 24 hr tablet, Take 1 tablet (25 mg total) by mouth daily., Disp: 90 tablet, Rfl: 3   Omega-3 Fatty Acids (FISH OIL PO), Take 1 tablet by mouth daily., Disp: , Rfl:    pantoprazole  (PROTONIX ) 40 MG tablet, TAKE 1 TABLET (40 MG TOTAL) BY MOUTH DAILY(AM), Disp: 30 tablet, Rfl: 0   polyethylene glycol (MIRALAX  / GLYCOLAX ) 17 g packet, Take 17 g by mouth daily as needed for moderate constipation., Disp: 14 each, Rfl: 0   tirzepatide  (MOUNJARO ) 7.5 MG/0.5ML Pen, Inject 7.5 mg into the skin once a week., Disp: 6 mL, Rfl: 1   VITAMIN E PO, Take 1 tablet by mouth daily., Disp: , Rfl:    Continuous Glucose Receiver (DEXCOM G7 RECEIVER) DEVI, 1 Device by Does not apply route continuous., Disp: 1 each, Rfl: 0   Continuous Glucose Sensor (DEXCOM G7 SENSOR) MISC, 1 Device by Does not apply route continuous., Disp: 9 each, Rfl: 3   docusate sodium  (COLACE) 100 MG capsule, Take 2 capsules (200 mg total) by mouth at bedtime., Disp: 10 capsule, Rfl: 0   erythromycin ophthalmic ointment,  Place 1 Application into the right eye 2 (two) times daily., Disp: , Rfl:    gabapentin  (NEURONTIN ) 100 MG capsule, Take 1 capsule (100 mg total) by mouth 3 (three) times daily. (Patient not taking: Reported on 07/01/2024), Disp: 270 capsule, Rfl: 1   Glucagon  (BAQSIMI  ONE PACK) 3 MG/DOSE POWD, Place 1 Device into the nose as needed (Low blood sugar with impaired consciousness)., Disp: 2 each, Rfl: 3   Menthol -Camphor (ICY HOT PRO NO MESS EX), Apply 1 Application topically 3 (three) times daily as needed (arms, neuropathy lower extrimity, gout in hand.)., Disp: , Rfl:    metolazone  (ZAROXOLYN ) 2.5 MG tablet, Take 1 tablet (2.5 mg total) by mouth every other day. (Patient not taking: Reported on 07/17/2024), Disp: 2 tablet, Rfl: 0   NYAMYC  powder, Apply 1 Application topically 2 (two) times daily., Disp: , Rfl:    nystatin  cream (MYCOSTATIN ), Apply 1 Application topically 2 (two) times daily as needed for dry skin., Disp: , Rfl:    potassium chloride  SA (KLOR-CON  M) 20 MEQ tablet, Take 1 tablet (20 mEq total) by mouth 2 (two) times daily., Disp: , Rfl:    rosuvastatin  (CRESTOR ) 40 MG tablet, Take 1 tablet (40 mg total) by mouth daily. (Patient taking differently: Take 40 mg by mouth at bedtime.), Disp: 90 tablet, Rfl: 3   torsemide  (DEMADEX ) 20 MG tablet, Take 2 tablets (40 mg total) by mouth 2 (two) times daily., Disp: , Rfl:    triamcinolone  ointment (KENALOG ) 0.1 %, Apply topically 2 (two) times daily., Disp: 454 g, Rfl: 0 Allergies  Allergen Reactions   Neurontin  [Gabapentin ] Nausea And Vomiting and Other (See Comments)    POSSIBLE SHAKING   Lyrica  [Pregabalin ] Other (See Comments)    Shaking    Other     Neurontin        Social History   Socioeconomic History   Marital status: Single    Spouse name: Not on file   Number of children: 1   Years of education: 12   Highest education level: 12th grade  Occupational History   Occupation: disabled  Tobacco Use   Smoking status: Every Day     Current packs/day: 1.00    Average packs/day: 1 pack/day for 44.0 years (44.0 ttl pk-yrs)  Types: E-cigarettes, Cigarettes   Smokeless tobacco: Former    Types: Snuff  Vaping Use   Vaping status: Former   Devices: 11/26/2018 stopped months ago  Substance and Sexual Activity   Alcohol use: Not Currently    Comment: occ   Drug use: Yes    Types: Crack cocaine, Marijuana, Oxycodone    Sexual activity: Not Currently  Other Topics Concern   Not on file  Social History Narrative   ** Merged History Encounter **       Lives in [place], in motel with sister.  They are looking to move but don't have a place to go yet.     Social Drivers of Health   Financial Resource Strain: Low Risk  (02/09/2023)   Overall Financial Resource Strain (CARDIA)    Difficulty of Paying Living Expenses: Not hard at all  Food Insecurity: No Food Insecurity (03/09/2024)   Hunger Vital Sign    Worried About Running Out of Food in the Last Year: Never true    Ran Out of Food in the Last Year: Never true  Transportation Needs: Unmet Transportation Needs (03/09/2024)   PRAPARE - Transportation    Lack of Transportation (Medical): Yes    Lack of Transportation (Non-Medical): Yes  Physical Activity: Inactive (02/09/2023)   Exercise Vital Sign    Days of Exercise per Week: 0 days    Minutes of Exercise per Session: 0 min  Stress: No Stress Concern Present (02/09/2023)   [employer] of Occupational Health - Occupational Stress Questionnaire    Feeling of Stress : Not at all  Social Connections: Unknown (08/21/2023)   Received from [facility]   Social Network    Social Network: Not on file  Intimate Partner Violence: Not At Risk (03/09/2024)   Humiliation, Afraid, Rape, and Kick questionnaire    Fear of Current or Ex-Partner: No    Emotionally Abused: No    Physically Abused: No    Sexually Abused: No    Physical Exam      Future Appointments  Date Time Provider Department Center  07/24/2024  12:00 PM [facility] None  07/30/2024  3:10 PM Aniceto Daphne CROME, NP [facility]  08/19/2024 11:40 AM Rolan Ezra RAMAN, MD [facility] None       [name], Paramedic [phone] [facility] Paramedic  07/17/24

## 2024-07-18 ENCOUNTER — Telehealth: Payer: Self-pay

## 2024-07-18 ENCOUNTER — Other Ambulatory Visit (HOSPITAL_COMMUNITY)

## 2024-07-18 NOTE — Telephone Encounter (Signed)
 Returned call. LVM requesting patient to call the office back to discuss result questions.

## 2024-07-18 NOTE — Telephone Encounter (Signed)
 LVM for patient to call office as provider has additional questions.

## 2024-07-18 NOTE — Telephone Encounter (Signed)
 Copied from CRM [id number]. Topic: Clinical - Lab/Test Results >> Jul 17, 2024  2:21 PM Joesph PARAS wrote: Reason for CRM: Patient calling once again for CT Results. Please return patient call.  Routing to the [facility] nurses to advise.

## 2024-07-19 NOTE — Telephone Encounter (Signed)
 LVM

## 2024-07-23 ENCOUNTER — Other Ambulatory Visit: Payer: Self-pay

## 2024-07-23 ENCOUNTER — Emergency Department (HOSPITAL_COMMUNITY)
Admission: EM | Admit: 2024-07-23 | Discharge: 2024-07-23 | Disposition: A | Discharge status: Home / Self Care | Attending: Emergency Medicine | Admitting: Emergency Medicine

## 2024-07-23 ENCOUNTER — Encounter (HOSPITAL_COMMUNITY): Payer: Self-pay

## 2024-07-23 ENCOUNTER — Emergency Department (HOSPITAL_COMMUNITY)

## 2024-07-23 DIAGNOSIS — Z794 Long term (current) use of insulin: Secondary | ICD-10-CM | POA: Insufficient documentation

## 2024-07-23 DIAGNOSIS — M25551 Pain in right hip: Secondary | ICD-10-CM | POA: Diagnosis not present

## 2024-07-23 DIAGNOSIS — E119 Type 2 diabetes mellitus without complications: Secondary | ICD-10-CM | POA: Diagnosis not present

## 2024-07-23 DIAGNOSIS — I480 Paroxysmal atrial fibrillation: Secondary | ICD-10-CM | POA: Diagnosis not present

## 2024-07-23 DIAGNOSIS — I2510 Atherosclerotic heart disease of native coronary artery without angina pectoris: Secondary | ICD-10-CM | POA: Diagnosis not present

## 2024-07-23 DIAGNOSIS — M2550 Pain in unspecified joint: Secondary | ICD-10-CM

## 2024-07-23 DIAGNOSIS — Z7901 Long term (current) use of anticoagulants: Secondary | ICD-10-CM | POA: Diagnosis not present

## 2024-07-23 DIAGNOSIS — M25561 Pain in right knee: Secondary | ICD-10-CM | POA: Diagnosis present

## 2024-07-23 DIAGNOSIS — I509 Heart failure, unspecified: Secondary | ICD-10-CM | POA: Diagnosis not present

## 2024-07-23 DIAGNOSIS — Z7951 Long term (current) use of inhaled steroids: Secondary | ICD-10-CM | POA: Diagnosis not present

## 2024-07-23 MED ORDER — OXYCODONE-ACETAMINOPHEN 5-325 MG PO TABS
1.0000 | ORAL_TABLET | Freq: Once | ORAL | Status: AC
  Administered 2024-07-23: 1 via ORAL
  Filled 2024-07-23: qty 1

## 2024-07-23 NOTE — ED Triage Notes (Addendum)
 Pt BIB [facility] from home c/o of R knee extremity pain after walking today and bilateral leg edema. Pt denies SOB, has a hx of DM and sciatic nerve pain.  [facility]; VSS

## 2024-07-23 NOTE — Telephone Encounter (Signed)
 [name]  I have spoken with the patient. She denies increased HR or coughing up blood. She will complete 3 month f/u scan in November. Reminder set.  Isaiah, RN

## 2024-07-23 NOTE — Discharge Instructions (Addendum)
 Your face did not show any evidence of fracture or dislocation.  You do have evidence of severe arthritis in your right knee.  You would benefit from follow-up with orthopedics.  We have provided a referral to Dr. Jerri of [facility]. Continue use of a walker. Return for new or concerning symptoms.

## 2024-07-23 NOTE — ED Notes (Signed)
 Pt denies shortness of breath, denies chest pain.

## 2024-07-23 NOTE — ED Provider Notes (Signed)
 [place] EMERGENCY DEPARTMENT AT [facility] Provider Note   CSN: [id number] Arrival date & time: 07/23/24  9750     Patient presents with: Extremity Pain and Edema   [name] is a 63 y.o. female.   63 y/o female with hx of polysubstance abuse, cardiomyopathy (LVEF 35-40%), s/dCHF, PAF (on Eliquis ), CAD, HLD, PAD and DM presents to the emergency department for evaluation of right lower extremity pain.  She states that she was walking with her walker at a party yesterday when her right knee was experiencing pain and felt as though it was going to give out.  This sensation subsided, but she was getting up in the middle of the night tonight when pain in her right lower extremity recurred; this time in her right hip.  She thought that she would fall secondary to pain, but denies any associated trauma tonight.  Is experiencing some radiation of the pain down her right leg.  She has not taken any medications for her symptoms.  Denies chest pain, shortness of breath, fever.  The history is provided by the patient and the EMS personnel. No language interpreter was used.  Extremity Pain       Prior to Admission medications   Medication Sig Start Date End Date Taking? Authorizing Provider  acetaminophen  (TYLENOL ) 500 MG tablet Take 1,000 mg by mouth every 6 (six) hours as needed for headache (pain).    [provider]  albuterol  (VENTOLIN  HFA) 108 (90 Base) MCG/ACT inhaler USE 2 PUFFS BY MOUTH EVERY FOUR HOURS, AS NEEDED, FOR COUGHING/WHEEZING 07/12/23   Newlin, [name], MD  allopurinol  (ZYLOPRIM ) 100 MG tablet Take 150 mg by mouth daily.    [provider]  amiodarone  (PACERONE ) 200 MG tablet Take 0.5 tablets (100 mg total) by mouth daily. 04/29/24   Aniceto Daphne CROME, NP  budesonide -formoterol  (SYMBICORT ) 160-4.5 MCG/ACT inhaler Inhale 2 puffs into the lungs 2 (two) times daily. 07/12/23   Newlin, [name], MD  buPROPion  (WELLBUTRIN  XL) 300 MG 24 hr tablet Take 300  mg by mouth every morning. 02/14/24   [provider]  Cholecalciferol  (VITAMIN D3) 50 MCG (2000 UT) capsule Take 1 capsule by mouth every morning.    [provider]  colchicine  0.6 MG tablet Take 1.2 mg by mouth See admin instructions. Take 1.2 mg for flair up and an hour later take 0.6 mg if needed    [provider]  Continuous Glucose Receiver (DEXCOM G7 RECEIVER) DEVI 1 Device by Does not apply route continuous. 06/25/24   Motwani, Komal, MD  Continuous Glucose Sensor (DEXCOM G7 SENSOR) MISC 1 Device by Does not apply route continuous. 06/25/24   Motwani, Komal, MD  Continuous Glucose Sensor (FREESTYLE LIBRE 3 PLUS SENSOR) MISC as directed. 04/25/24   [provider]  docusate sodium  (COLACE) 100 MG capsule Take 2 capsules (200 mg total) by mouth at bedtime. 03/17/24   Regalado, Belkys A, MD  ELIQUIS  5 MG TABS tablet TAKE 1 TABLET (5 MG TOTAL) BY MOUTH 2 (TWO) TIMES DAILY. 06/10/24   Clegg, [name] D, NP  erythromycin ophthalmic ointment Place 1 Application into the right eye 2 (two) times daily. 02/27/24   [provider]  fluticasone  (FLONASE) 50 MCG/ACT nasal spray Place 1 spray into both nostrils as needed for allergies or rhinitis.    [provider]  folic acid  (FOLVITE ) 1 MG tablet Take 1 tablet (1 mg total) by mouth daily. 03/17/24   Regalado, Owen LABOR, MD  gabapentin  (  NEURONTIN ) 100 MG capsule Take 1 capsule (100 mg total) by mouth 3 (three) times daily. Patient not taking: Reported on 07/01/2024 02/05/24 06/26/24  Motwani, Komal, MD  Glucagon  (BAQSIMI  ONE PACK) 3 MG/DOSE POWD Place 1 Device into the nose as needed (Low blood sugar with impaired consciousness). 05/22/24   Motwani, Komal, MD  insulin  lispro (HUMALOG ) 100 UNIT/ML KwikPen Inject 15 Units into the skin 3 (three) times daily with meals. Plus sliding scale, max dose 70 units /day 03/17/24   Regalado, Belkys A, MD  isosorbide -hydrALAZINE  (BIDIL ) 20-37.5 MG tablet Take 1 tablet by mouth 3 (three)  times daily. 05/21/24   Milford, Harlene HERO, FNP  LANTUS  SOLOSTAR 100 UNIT/ML Solostar Pen Inject 30 Units into the skin daily. 03/17/24   Regalado, Belkys A, MD  levothyroxine  (SYNTHROID ) 50 MCG tablet TAKE 1 TABLET (50 MCG TOTAL) BY MOUTH DAILY BEFORE BREAKFAST (AM) 05/02/23   Newlin, [name], MD  Menthol -Camphor (ICY HOT PRO NO MESS EX) Apply 1 Application topically 3 (three) times daily as needed (arms, neuropathy lower extrimity, gout in hand.).    [provider]  metolazone  (ZAROXOLYN ) 2.5 MG tablet Take 1 tablet (2.5 mg total) by mouth every other day. Patient not taking: Reported on 07/17/2024 07/01/24 09/29/24  Donette [name] LABOR, FNP  metoprolol  succinate (TOPROL -XL) 25 MG 24 hr tablet Take 1 tablet (25 mg total) by mouth daily. 10/19/23   Marcine Catalan M, PA-C  NYAMYC  powder Apply 1 Application topically 2 (two) times daily. 02/09/24   [provider]  nystatin  cream (MYCOSTATIN ) Apply 1 Application topically 2 (two) times daily as needed for dry skin. 02/09/24   [provider]  Omega-3 Fatty Acids (FISH OIL PO) Take 1 tablet by mouth daily.    [provider]  pantoprazole  (PROTONIX ) 40 MG tablet TAKE 1 TABLET (40 MG TOTAL) BY MOUTH DAILY(AM) 05/02/23   Newlin, [name], MD  polyethylene glycol (MIRALAX  / GLYCOLAX ) 17 g packet Take 17 g by mouth daily as needed for moderate constipation. 08/05/23   Akula, Vijaya, MD  potassium chloride  SA (KLOR-CON  M) 20 MEQ tablet Take 1 tablet (20 mEq total) by mouth 2 (two) times daily. 07/17/24   Lee, [place], NP  rosuvastatin  (CRESTOR ) 40 MG tablet Take 1 tablet (40 mg total) by mouth daily. Patient taking differently: Take 40 mg by mouth at bedtime. 04/04/24   Rolan Ezra RAMAN, MD  tirzepatide  (MOUNJARO ) 7.5 MG/0.5ML Pen Inject 7.5 mg into the skin once a week. 05/22/24   Motwani, Komal, MD  torsemide  (DEMADEX ) 20 MG tablet Take 2 tablets (40 mg total) by mouth 2 (two) times daily. 07/17/24 10/15/24  Lee, [place], NP  triamcinolone   ointment (KENALOG ) 0.1 % Apply topically 2 (two) times daily. 08/09/23   Newlin, [name], MD  VITAMIN E PO Take 1 tablet by mouth daily.    [provider]  tiotropium (SPIRIVA  HANDIHALER) 18 MCG inhalation capsule Place 1 capsule (18 mcg total) into inhaler and inhale every morning. 12/09/19 02/09/21  Sebastian Toribio GAILS, MD    Allergies: Neurontin  [gabapentin ], Lyrica  [pregabalin ], and Other    Review of Systems Ten systems reviewed and are negative for acute change, except as noted in the HPI.    Updated Vital Signs BP (!) 132/59   Pulse 63   Temp 98.4 F (36.9 C) (Oral)   Resp 19   Ht 5' 3 (1.6 m)   Wt 99.8 kg   LMP  (LMP Unknown)   SpO2 98%   BMI 38.97 kg/m  Physical Exam Vitals and nursing note reviewed.  Constitutional:      General: She is not in acute distress.    Appearance: She is well-developed. She is not diaphoretic.     Comments: Chronically ill appearing and obese. Nontoxic.   HENT:     Head: Normocephalic and atraumatic.  Eyes:     General: No scleral icterus.    Conjunctiva/sclera: Conjunctivae normal.  Cardiovascular:     Rate and Rhythm: Normal rate and regular rhythm.     Pulses: Normal pulses.     Comments: DP pulse 2+ in the RLE Pulmonary:     Effort: Pulmonary effort is normal. No respiratory distress.     Comments: Respirations even and unlabored Musculoskeletal:        General: Normal range of motion.     Cervical back: Normal range of motion.     Comments: TTP to the R hip joint without crepitus or deformity. Decreased ROM 2/2 pain. RLE is warm and well perfused. 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 No pitting edema noted in BLE. No leg shortening or malrotation.  Skin:    General: Skin is warm and dry.     Coloration: Skin is not pale.     Findings: No erythema or rash.  Neurological:     Mental Status: She is alert and oriented to person, place, and time.  Psychiatric:        Behavior: Behavior normal.      (all labs ordered are listed, but only abnormal results are displayed) Labs Reviewed - No data to display  EKG: None  Radiology: DG Knee Complete 4 Views Right Result Date: 07/23/2024 CLINICAL DATA:  63 year old female with right hip and lower extremity pain with no known injury. EXAM: RIGHT KNEE - COMPLETE 4+ VIEW COMPARISON:  Right hip series today. Right knee series 05/03/2021 FINDINGS: Four views. Extensive vascular stent of the right femoral artery extending to the proximal popliteal artery level, new since the 2022 radiographs. Underlying calcified peripheral vascular disease. 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 Bone on bone appearance of the lateral compartment joint space now. Bulky tricompartmental osteophytosis. No joint effusion identified. Patella appears intact. No acute osseous abnormality identified. IMPRESSION: 1. 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 No acute osseous abnormality identified. 2. Calcified peripheral vascular disease with extensive right femoral artery stenting since 2022. Electronically Signed   By: VEAR Hurst M.D.   On: 07/23/2024 04:14   DG Hip Unilat W or Wo Pelvis 2-3 Views Right Result Date: 07/23/2024 CLINICAL DATA:  63 year old female with right hip and lower extremity pain with no known injury. EXAM: DG HIP (WITH OR WITHOUT PELVIS) 2-3V RIGHT COMPARISON:  CT Abdomen and Pelvis 04/18/2022. Right hip radiographs 12/17/2021. FINDINGS: Three views 0354 hours. Femoral heads remain normally located. Bony pelvis appears stable and intact. Chronic right pelvic phlebolith. Femoral artery calcified atherosclerosis. Grossly intact proximal left femur. Proximal right femur appears intact. Right thigh femoral artery vascular stent is partially visible. Hip joint spaces appear symmetric and stable from the 2023 CT. Nonobstructed visible bowel gas pattern.  IMPRESSION: 1. No acute osseous abnormality identified about the right hip or pelvis. 2. Femoral artery calcified atherosclerosis and right mid femoral artery vascular stent. Electronically Signed   By: VEAR Hurst M.D.   On: 07/23/2024 04:12     Procedures   Medications Ordered in the ED  oxyCODONE -acetaminophen  (PERCOCET/ROXICET) 5-325  MG per tablet 1 tablet (1 tablet Oral Given 07/23/24 0340)                                    Medical Decision Making Amount and/or Complexity of Data Reviewed Radiology: ordered.  Risk Prescription drug management.   This patient presents to the ED for concern of R hip and knee pain, this involves an extensive number of treatment options, and is a complaint that carries with it a high risk of complications and morbidity.  The differential diagnosis includes arthritis vs fracture vs joint dislocation vs septic joint.   Co morbidities that complicate the patient evaluation  Polysubstance abuse CM CHF PAF CAD   Additional history obtained:  Additional history obtained from EMS personnel   Imaging Studies ordered:  I ordered imaging studies including Xray R knee and R hip  I independently visualized and interpreted imaging which showed arthritis of the R knee without fracture or dislocation in the RLE I agree with the radiologist interpretation   Cardiac Monitoring:  The patient was maintained on a cardiac monitor.  I personally viewed and interpreted the cardiac monitored which showed an underlying rhythm of: NSR   Medicines ordered and prescription drug management:  I ordered medication including percocet for pain  Reevaluation of the patient after these medicines showed that the patient improved I have reviewed the patients home medicines and have made adjustments as needed   Test Considered:  CT hip   Problem List / ED Course:  Patient presents to the emergency department for evaluation of R hip and knee pain. No hx of falls  or trauma.  Patient neurovascularly intact on exam. Imaging negative for fracture, dislocation, bony deformity. No swelling, erythema, heat to touch to the affected area; no concern for septic joint. Compartments in the affected extremity are soft.  Plan for supportive management; primary care follow up as needed.   Reevaluation:  After the interventions noted above, I reevaluated the patient and found that they have :improved   Social Determinants of Health:  Ambulatory with assist device   Dispostion:  After consideration of the diagnostic results and the patients response to treatment, I feel that the patent would benefit from referral to Orthopedics and f/u with PCP in the interim. Return precautions discussed and provided. Patient discharged in stable condition with no unaddressed concerns.       Final diagnoses:  Arthralgia, unspecified joint    ED Discharge Orders          Ordered    AMB referral to orthopedics        07/23/24 0611               Keith Sor, PA-C 07/23/24 9351    Raford Lenis, MD 07/23/24 [id number]

## 2024-07-24 ENCOUNTER — Other Ambulatory Visit (HOSPITAL_COMMUNITY): Payer: Self-pay

## 2024-07-24 ENCOUNTER — Other Ambulatory Visit (HOSPITAL_COMMUNITY)

## 2024-07-24 NOTE — Progress Notes (Unsigned)
 Paramedicine Encounter    Patient ID: [name], female    DOB: 1961-07-19, 63 y.o.   MRN: [id number]   Complaints-leg pain   Edema-yes-nothing abnormal  Compliance with meds-yes  Pill box filled-yes If so, by whom-paramedic   Refills needed- amio, potassium       Pt reports she is doing ok. She had to go to ER last night due to leg pain, was told it was bad arthritis and told her to f/u with ortho.  Pt denies increased sob.   Still waiting on pcp to get her folic acid .   I reached out to [facility] these med changes she told me about and I missed the phone call but message was he wanted her to decrease amlodipine  to 5mg  and it was ok for her pcp to start her on jardiance ---she was taken off jardiance  while back ref UTIs and also she is off amlodipine . So waiting to get kidney back on phone to review the med list so they know which meds she is taking as it seems all providers are not on same page.   Meds verified and pill box refilled.  P/u her meds today b/c delivery driver couldn't get her on the phone- the mounjaro  will be delivered tomor.-pharm had to order.     She missed her lab today so got her resch for tomor.    BP 128/62   Pulse (!) 58   Resp 16   Wt 220 lb (99.8 kg)   LMP  (LMP Unknown)   SpO2 98%   BMI 38.97 kg/m  Weight yesterday-219 Last visit weight-216  Patient Care Team: Campbell Reynolds, NP as PCP - [facility] Court Dorn PARAS, MD as PCP - Cardiology (Cardiology) Rolan Ezra GORMAN, MD as PCP - Advanced Heart Failure (Cardiology) Inocencio Soyla Lunger, MD as PCP - Electrophysiology (Cardiology) Court Dorn PARAS, MD as Consulting Physician (Cardiology) Darlean Ozell NOVAK, MD as Consulting Physician (Pulmonary Disease) System, Provider Not In  Patient Active Problem List   Diagnosis Date Noted  . Persistent atrial fibrillation ([name]) 03/15/2024  . Benign neoplasm of ascending colon 03/14/2024  . Benign neoplasm of transverse colon 03/14/2024   . Benign neoplasm of descending colon 03/14/2024  . Benign neoplasm of sigmoid colon 03/14/2024  . Chronic systolic congestive heart failure ([name]) 03/12/2024  . Melena 03/12/2024  . Heme positive stool 03/11/2024  . GI bleed 03/08/2024  . History of CAD (coronary artery disease) 03/08/2024  . CKD (chronic kidney disease), stage IV ([name]) 03/08/2024  . Continuous dependence on cigarette smoking 03/08/2024  . GAD (generalized anxiety disorder) 03/08/2024  . HFrEF (heart failure with reduced ejection fraction) ([name]) 10/11/2023  . 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) and diastolic (congestive) heart failure ([name]) 10/10/2023  . Pre-ulcerative calluses 04/25/2022  . PAF (paroxysmal atrial fibrillation) ([name]) 05/05/2021  . COPD (chronic obstructive pulmonary disease) ([name]) 05/05/2021  . OSA (obstructive sleep apnea) 05/05/2021  . Stage 3b chronic kidney disease ([name]) 05/05/2021  . Pressure injury of skin 05/04/2021  . Diabetic nephropathy ([name]) 01/02/2021  . Low back pain 06/01/2020  . Hyperlipidemia 04/03/2020  . Peripheral artery disease ([name]) 12/26/2019  . NICM (nonischemic cardiomyopathy) ([name]) 06/20/2019  . Non-healing ulcer ([name]) 06/20/2019  . Coagulation disorder ([name]) 08/09/2017  . Depression 07/21/2017  . At risk for adverse drug reaction 06/20/2017  . Peripheral neuropathy 06/20/2017  . S/P transmetatarsal amputation of foot, right ([name]) 06/05/2017  . 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 Obesity, class 2 02/24/2016  . Anticoagulation management encounter 02/10/2016  . Chronic sinus bradycardia 01/12/2016  . Essential hypertension 12/22/2015  . Demand ischemia ([name])   . Acute on chronic diastolic CHF (congestive heart failure) ([name])   . Symptomatic anemia 11/08/2015  . Hypokalemia 11/08/2015  . Tobacco abuse 10/23/2015  . Coronary artery disease   . DOE (dyspnea on exertion) 04/29/2015   . Paroxysmal atrial fibrillation ([name]) 01/16/2015  . Carotid artery stenosis 01/16/2015  . Insomnia 02/03/2014  . S/P peripheral artery angioplasty - TurboHawk atherectomy; R SFA 09/11/2013    Class: Acute  . Leg pain, bilateral 08/19/2013  . Hypothyroidism 07/31/2013  . History of cocaine abuse ([name]) 06/13/2013  . Long term current use of anticoagulant therapy 05/20/2013  . Alcohol abuse   . Narcotic abuse ([name])   . Marijuana abuse   . Alcoholic cirrhosis ([name])   . Insulin  dependent type 2 diabetes mellitus ([name])     Current Outpatient Medications:  .  acetaminophen  (TYLENOL ) 500 MG tablet, Take 1,000 mg by mouth every 6 (six) hours as needed for headache (pain)., Disp: , Rfl:  .  albuterol  (VENTOLIN  HFA) 108 (90 Base) MCG/ACT inhaler, USE 2 PUFFS BY MOUTH EVERY FOUR HOURS, AS NEEDED, FOR COUGHING/WHEEZING, Disp: 8.5 g, Rfl: 0 .  allopurinol  (ZYLOPRIM ) 100 MG tablet, Take 150 mg by mouth daily., Disp: , Rfl:  .  amiodarone  (PACERONE ) 200 MG tablet, Take 0.5 tablets (100 mg total) by mouth daily., Disp: 30 tablet, Rfl: 1 .  budesonide -formoterol  (SYMBICORT ) 160-4.5 MCG/ACT inhaler, Inhale 2 puffs into the lungs 2 (two) times daily., Disp: 1 each, Rfl: 2 .  buPROPion  (WELLBUTRIN  XL) 300 MG 24 hr tablet, Take 300 mg by mouth every morning., Disp: , Rfl:  .  Cholecalciferol  (VITAMIN D3) 50 MCG (2000 UT) capsule, Take 1 capsule by mouth every morning., Disp: , Rfl:  .  colchicine  0.6 MG tablet, Take 1.2 mg by mouth See admin instructions. Take 1.2 mg for flair up and an hour later take 0.6 mg if needed, Disp: , Rfl:  .  ELIQUIS  5 MG TABS tablet, TAKE 1 TABLET (5 MG TOTAL) BY MOUTH 2 (TWO) TIMES DAILY., Disp: 60 tablet, Rfl: 6 .  isosorbide -hydrALAZINE  (BIDIL ) 20-37.5 MG tablet, Take 1 tablet by mouth 3 (three) times daily., Disp: 270 tablet, Rfl: 3 .  LANTUS  SOLOSTAR 100 UNIT/ML Solostar Pen, Inject 30 Units into the skin daily., Disp: 15 mL, Rfl: 0 .  levothyroxine  (SYNTHROID ) 50 MCG  tablet, TAKE 1 TABLET (50 MCG TOTAL) BY MOUTH DAILY BEFORE BREAKFAST (AM), Disp: 30 tablet, Rfl: 0 .  metoprolol  succinate (TOPROL -XL) 25 MG 24 hr tablet, Take 1 tablet (25 mg total) by mouth daily., Disp: 90 tablet, Rfl: 3 .  Omega-3 Fatty Acids (FISH OIL PO), Take 1 tablet by mouth daily., Disp: , Rfl:  .  pantoprazole  (PROTONIX ) 40 MG tablet, TAKE 1 TABLET (40 MG TOTAL) BY MOUTH DAILY(AM), Disp: 30 tablet, Rfl: 0 .  potassium chloride  SA (KLOR-CON  M) 20 MEQ tablet, Take 1 tablet (20 mEq total) by mouth 2 (two) times daily., Disp: , Rfl:  .  tirzepatide  (MOUNJARO ) 7.5 MG/0.5ML Pen, Inject 7.5 mg into the skin once a week., Disp: 6 mL, Rfl: 1 .  torsemide  (DEMADEX ) 20 MG tablet, Take 2 tablets (40 mg total) by mouth 2 (two) times daily., Disp: , Rfl:  .  VITAMIN E PO, Take 1 tablet by mouth daily., Disp: , Rfl:  .  Continuous Glucose Receiver (DEXCOM G7 RECEIVER) DEVI, 1 Device by Does not apply route continuous., Disp: 1 each, Rfl: 0 .  Continuous Glucose Sensor (DEXCOM G7 SENSOR) MISC, 1 Device by Does not apply route continuous., Disp: 9 each, Rfl: 3 .  Continuous Glucose Sensor (FREESTYLE LIBRE 3 PLUS SENSOR) MISC, as directed., Disp: , Rfl:  .  docusate sodium  (COLACE) 100 MG capsule, Take 2 capsules (200 mg total) by mouth at bedtime., Disp: 10 capsule, Rfl: 0 .  erythromycin ophthalmic ointment, Place 1 Application into the right eye 2 (two) times daily., Disp: , Rfl:  .  fluticasone  (FLONASE) 50 MCG/ACT nasal spray, Place 1 spray into both nostrils as needed for allergies or rhinitis., Disp: , Rfl:  .  folic acid  (FOLVITE ) 1 MG tablet, Take 1 tablet (1 mg total) by mouth daily., Disp: 30 tablet, Rfl: 0 .  gabapentin  (NEURONTIN ) 100 MG capsule, Take 1 capsule (100 mg total) by mouth 3 (three) times daily. (Patient not taking: Reported on 07/01/2024), Disp: 270 capsule, Rfl: 1 .  Glucagon  (BAQSIMI  ONE PACK) 3 MG/DOSE POWD, Place 1 Device into the nose as needed (Low blood sugar with impaired  consciousness)., Disp: 2 each, Rfl: 3 .  insulin  lispro (HUMALOG ) 100 UNIT/ML KwikPen, Inject 15 Units into the skin 3 (three) times daily with meals. Plus sliding scale, max dose 70 units /day, Disp: 25 mL, Rfl: 1 .  Menthol -Camphor (ICY HOT PRO NO MESS EX), Apply 1 Application topically 3 (three) times daily as needed (arms, neuropathy lower extrimity, gout in hand.)., Disp: , Rfl:  .  metolazone  (ZAROXOLYN ) 2.5 MG tablet, Take 1 tablet (2.5 mg total) by mouth every other day. (Patient not taking: Reported on 07/17/2024), Disp: 2 tablet, Rfl: 0 .  NYAMYC  powder, Apply 1 Application topically 2 (two) times daily., Disp: , Rfl:  .  nystatin  cream (MYCOSTATIN ), Apply 1 Application topically 2 (two) times daily as needed for dry skin., Disp: , Rfl:  .  polyethylene glycol (MIRALAX  / GLYCOLAX ) 17 g packet, Take 17 g by mouth daily as needed for moderate constipation., Disp: 14 each, Rfl: 0 .  rosuvastatin  (CRESTOR ) 40 MG tablet, Take 1 tablet (40 mg total) by mouth daily. (Patient taking differently: Take 40 mg by mouth at bedtime.), Disp: 90 tablet, Rfl: 3 .  triamcinolone  ointment (KENALOG ) 0.1 %, Apply topically 2 (two) times daily., Disp: 454 g, Rfl: 0 Allergies  Allergen Reactions  . Neurontin  [Gabapentin ] Nausea And Vomiting and Other (See Comments)    POSSIBLE SHAKING  . Lyrica  [Pregabalin ] Other (See Comments)    Shaking   . Other     Neurontin        Social History   Socioeconomic History  . Marital status: Single    Spouse name: Not on file  . Number of children: 1  . Years of education: 7  . Highest education level: 12th grade  Occupational History  . Occupation: disabled  Tobacco Use  . Smoking status: Every Day    Current packs/day: 1.00    Average packs/day: 1 pack/day for 44.0 years (44.0 ttl pk-yrs)    Types: E-cigarettes, Cigarettes  . Smokeless tobacco: Former    Types: Snuff  Vaping Use  . Vaping status: Former  . Devices: 11/26/2018 stopped months ago   Substance and Sexual Activity  . Alcohol use: Not Currently    Comment: occ  . Drug use: Yes    Types: Crack cocaine, Marijuana, Oxycodone   . Sexual activity: Not Currently  Other  Topics Concern  . Not on file  Social History Narrative   ** Merged History Encounter **       Lives in [place], in motel with sister.  They are looking to move but don't have a place to go yet.     Social Drivers of Health   Financial Resource Strain: Low Risk  (02/09/2023)   Overall Financial Resource Strain (CARDIA)   . Difficulty of Paying Living Expenses: Not hard at all  Food Insecurity: No Food Insecurity (03/09/2024)   Hunger Vital Sign   . Worried About Programme researcher, broadcasting/film/video in the Last Year: Never true   . Ran Out of Food in the Last Year: Never true  Transportation Needs: Unmet Transportation Needs (03/09/2024)   PRAPARE - Transportation   . Lack of Transportation (Medical): Yes   . Lack of Transportation (Non-Medical): Yes  Physical Activity: Inactive (02/09/2023)   Exercise Vital Sign   . Days of Exercise per Week: 0 days   . Minutes of Exercise per Session: 0 min  Stress: No Stress Concern Present (02/09/2023)   [employer] of Occupational Health - Occupational Stress Questionnaire   . Feeling of Stress : Not at all  Social Connections: Unknown (08/21/2023)   Received from [facility]   Social Network   . Social Network: Not on file  Intimate Partner Violence: Not At Risk (03/09/2024)   Humiliation, Afraid, Rape, and Kick questionnaire   . Fear of Current or Ex-Partner: No   . Emotionally Abused: No   . Physically Abused: No   . Sexually Abused: No    Physical Exam      Future Appointments  Date Time Provider Department Center  07/25/2024 11:15 AM [facility] LAB [facility] None  07/30/2024  3:10 PM Aniceto Daphne CROME, NP [facility] H&V  08/19/2024 11:40 AM Rolan Ezra RAMAN, MD [facility] None       [name], Paramedic [phone] [facility] Paramedic  07/24/24

## 2024-07-25 ENCOUNTER — Ambulatory Visit (HOSPITAL_COMMUNITY): Payer: Self-pay | Admitting: Cardiology

## 2024-07-25 ENCOUNTER — Ambulatory Visit (HOSPITAL_COMMUNITY)
Admission: RE | Admit: 2024-07-25 | Discharge: 2024-07-25 | Disposition: A | Discharge status: Home / Self Care | Source: Ambulatory Visit | Attending: Cardiology | Admitting: Cardiology

## 2024-07-25 DIAGNOSIS — I5022 Chronic systolic (congestive) heart failure: Secondary | ICD-10-CM | POA: Diagnosis present

## 2024-07-25 LAB — BASIC METABOLIC PANEL WITH GFR
Anion gap: 11 (ref 5–15)
BUN: 74 mg/dL — ABNORMAL HIGH (ref 8–23)
CO2: 22 mmol/L (ref 22–32)
Calcium: 8.3 mg/dL — ABNORMAL LOW (ref 8.9–10.3)
Chloride: 107 mmol/L (ref 98–111)
Creatinine, Ser: 2.55 mg/dL — ABNORMAL HIGH (ref 0.44–1.00)
GFR, Estimated: 21 mL/min — ABNORMAL LOW (ref >60)
Glucose, Bld: 199 mg/dL — ABNORMAL HIGH (ref 70–99)
Potassium: 4.8 mmol/L (ref 3.5–5.1)
Sodium: 140 mmol/L (ref 135–145)

## 2024-07-29 ENCOUNTER — Other Ambulatory Visit (HOSPITAL_COMMUNITY): Payer: Self-pay

## 2024-07-29 ENCOUNTER — Telehealth (HOSPITAL_COMMUNITY): Payer: Self-pay

## 2024-07-29 ENCOUNTER — Telehealth: Payer: Self-pay | Admitting: Family

## 2024-07-29 ENCOUNTER — Other Ambulatory Visit: Payer: Self-pay | Admitting: Pulmonary Disease

## 2024-07-29 MED ORDER — METOLAZONE 2.5 MG PO TABS: 2.5000 mg | ORAL_TABLET | Freq: Every day | ORAL | 0 refills | Status: DC

## 2024-07-29 NOTE — Progress Notes (Deleted)
  Electrophysiology Office Note:   Date:  07/29/2024  ID:  LYNZEE LINDQUIST, DOB [birth year], MRN [id number]  Primary Cardiologist: Dorn Lesches, MD Primary Heart Failure: Ezra Shuck, MD Electrophysiologist: Will Gladis Norton, MD  {Click to update primary MD,subspecialty MD or APP then REFRESH:1}    History of Present Illness:   [name] is a 63 y.o. female with h/o AF, HFrEF, CAD, HLD, carotid stenosis s/p CEA, PAD, tobacco abuse, COPD, gout, GIB seen today for routine electrophysiology followup.   Admitted 03/08/24 - 03/17/24 with chest pain and melena.  Pt had been using BC powder, Hgb was 5.3 on admit.  Endoscopy was negative for source of bleeding.  Colonoscopy showed multiple polyps. Seen in EP Clinic 04/29/24 and was doing well. No further bleeding, back on ELiquis  and Hgb up to 8.   Alerted by [facility] monitoring staff that fluid status was up on 9/15 > she was given metolazone  and labs are pending.  Since last being seen in our clinic the patient reports doing ***.   She denies chest pain, palpitations, dyspnea, PND, orthopnea, nausea, vomiting, dizziness, syncope, edema, weight gain, or early satiety.    Review of systems complete and found to be negative unless listed in HPI.   EP Information / Studies Reviewed:    EKG is not ordered today. EKG from 04/29/24 reviewed which showed NSR with sinus arrhythmia 67 bpm       Arrhythmia / AAD / Pertinent EP Studies AF    Risk Assessment/Calculations:    CHA2DS2-VASc Score = 5  {Confirm score is correct.  If not, click here to update score.  REFRESH note.  :1} This indicates a 7.2% annual risk of stroke. The patient's score is based upon: CHF History: 1 HTN History: 1 Diabetes History: 1 Stroke History: 0 Vascular Disease History: 1 Age Score: 0 Gender Score: 1   {This patient has a significant risk of stroke if diagnosed with atrial fibrillation.  Please consider VKA or DOAC agent for anticoagulation if the  bleeding risk is acceptable.   You can also use the SmartPhrase .[email or web address] for documentation.   :[id number]} No BP recorded.  {Refresh Note OR Click here to enter BP  :1}***        Physical Exam:   VS:  LMP  (LMP Unknown)    Wt Readings from Last 3 Encounters:  07/24/24 220 lb (99.8 kg)  07/23/24 220 lb (99.8 kg)  07/17/24 216 lb (98 kg)     GEN: Well nourished, well developed in no acute distress NECK: No JVD; No carotid bruits CARDIAC: {EPRHYTHM:28826}, no murmurs, rubs, gallops RESPIRATORY:  Clear to auscultation without rales, wheezing or rhonchi  ABDOMEN: Soft, non-tender, non-distended EXTREMITIES:  No edema; No deformity   ASSESSMENT AND PLAN:    Paroxysmal Atrial Fibrillation  High Risk Medication Monitoring: Amiodarone   CHA2DS2-VASc 5, hx DCCV -OAC for stroke prophylaxis  -continue amiodarone  100 mg daily  -amio labs up to date as of 04/2024  -Toprol  25 mg daily   Secondary Hypercoagulable State  -continue Eliquis  5mg  BID, dose reviewed and appropriate by age / wt  -no further bleeding issues ***   HFmrEF  -follows with AHF Team  -alert of elevated fluid received on 07/29/24 > volume status ? *** -cardiorenal syndrome limits GDMT   CAD  PVD  -per Cardiology    Follow up with Dr. Norton {EPFOLLOW LE:71826}  Signed, Daphne Barrack, NP-C, AGACNP-BC [facility] - Electrophysiology  07/30/2024, 7:45 AM

## 2024-07-29 NOTE — Telephone Encounter (Signed)
 Contacted [facility] to request the following for refills:   Amio  Potassium   [name], Paramedic [phone] 07/29/24

## 2024-07-29 NOTE — Telephone Encounter (Signed)
  [facility] Remote Monitoring  S/P Cardi[facility] Implant 02/05/20  PAD Goal: 20 Most recent reading: 37 indicating fluid accumulation  Recommended changes:   Metolazone  2.5mg  daily X 2 doses with 3rd dose if needed. Daily transmissions encouraged, decrease sodium/ fluid intake. BMET in 4 days. Will reach out to provider that is seeing patient tomorrow to update.  [name]) informed of recommendations.   I continue to review and analyze the patients PA pressures weekly (and more often as needed) to bring PA pressures within the optimal range.     Karla Class, FNP [facility] [phone] 07/29/24

## 2024-07-29 NOTE — Progress Notes (Signed)
 Paramedicine Encounter    Patient ID: [name], female    DOB: [birth year], 63 y.o.   MRN: [id number]   Complaints-leg pain   Edema-yes, appears bloated   Compliance with meds-yes  Pill box filled-yes  If so, by whom-paramedic  Refills needed- Flonase    Pt reports she is doing ok. Came out today due to elevated cardiomems readings. Her goal is 20 and she is at 22.  Med changes per tina.  P/u metolazone  at pharm to place in pill box.  Upon review of pl box she is missing a morning dose of meds-not sure if she took extra or what happened.  She does appear bloated, but she denies feeling like she is.  She is c/o constipation. Advised her to take stool softener or that miralax . She has to get some more.  Reviewed her diet- she has at spaghetti and mac and ula this week and some baked chicken.  She reports increased fluid intake too. So reviewed both those with her and to limit those.  Pt denies bleeding issues, no falls.  Denies c/p, no sob.   Meds verified and pill box refilled.  Gave her the metolazone  for today and then placed tomors dose in pill box. Will come back out on wed for another f/u and will keep lookout for the lab appoint for her on Friday to let her know the details.   CBG -242 BP (!) 140/62   Pulse 78   Wt 223 lb (101.2 kg)   LMP  (LMP Unknown)   SpO2 94%   BMI 39.50 kg/m  Weight yesterday-? Last visit weight-220  Patient Care Team: Campbell Reynolds, NP as PCP - General Court Dorn PARAS, MD as PCP - Cardiology (Cardiology) Rolan Ezra GORMAN, MD as PCP - Advanced Heart Failure (Cardiology) Inocencio Soyla Lunger, MD as PCP - Electrophysiology (Cardiology) Court Dorn PARAS, MD as Consulting Physician (Cardiology) Darlean Ozell NOVAK, MD as Consulting Physician (Pulmonary Disease) System, Provider Not In  Patient Active Problem List   Diagnosis Date Noted   Persistent atrial fibrillation ([name]) 03/15/2024   Benign neoplasm of ascending colon 03/14/2024    Benign neoplasm of transverse colon 03/14/2024   Benign neoplasm of descending colon 03/14/2024   Benign neoplasm of sigmoid colon 03/14/2024   Chronic systolic congestive heart failure ([name]) 03/12/2024   Melena 03/12/2024   Heme positive stool 03/11/2024   GI bleed 03/08/2024   History of CAD (coronary artery disease) 03/08/2024   CKD (chronic kidney disease), stage IV ([name]) 03/08/2024   Continuous dependence on cigarette smoking 03/08/2024   GAD (generalized anxiety disorder) 03/08/2024   HFrEF (heart failure with reduced ejection fraction) ([name]) 10/11/2023   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) and diastolic (congestive) heart failure ([name]) 10/10/2023   Pre-ulcerative calluses 04/25/2022   PAF (paroxysmal atrial fibrillation) ([name]) 05/05/2021   COPD (chronic obstructive pulmonary disease) ([name]) 05/05/2021   OSA (obstructive sleep apnea) 05/05/2021   Stage 3b chronic kidney disease ([name]) 05/05/2021   Pressure injury of skin 05/04/2021   Diabetic nephropathy ([name]) 01/02/2021   Low back pain 06/01/2020   Hyperlipidemia 04/03/2020   Peripheral artery disease ([name]) 12/26/2019   NICM (nonischemic cardiomyopathy) ([name]) 06/20/2019   Non-healing ulcer ([name]) 06/20/2019   Coagulation disorder ([name]) 08/09/2017   Depression 07/21/2017   At risk for adverse drug reaction 06/20/2017   Peripheral neuropathy 06/20/2017   S/P transmetatarsal amputation of foot, right ([name]) 06/05/2017   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 ([name]) 05/19/2017   Obesity, class 2 02/24/2016   Anticoagulation management encounter 02/10/2016   Chronic sinus bradycardia 01/12/2016   Essential hypertension 12/22/2015   Demand ischemia ([name])    Acute on chronic diastolic CHF (congestive heart failure) ([name])    Symptomatic anemia 11/08/2015   Hypokalemia 11/08/2015   Tobacco abuse 10/23/2015   Coronary artery disease    DOE (dyspnea on exertion)  04/29/2015   Paroxysmal atrial fibrillation ([name]) 01/16/2015   Carotid artery stenosis 01/16/2015   Insomnia 02/03/2014   S/P peripheral artery angioplasty - TurboHawk atherectomy; R SFA 09/11/2013    Class: Acute   Leg pain, bilateral 08/19/2013   Hypothyroidism 07/31/2013   History of cocaine abuse ([name]) 06/13/2013   Long term current use of anticoagulant therapy 05/20/2013   Alcohol abuse    Narcotic abuse ([name])    Marijuana abuse    Alcoholic cirrhosis ([name])    Insulin  dependent type 2 diabetes mellitus ([name])     Current Outpatient Medications:    acetaminophen  (TYLENOL ) 500 MG tablet, Take 1,000 mg by mouth every 6 (six) hours as needed for headache (pain)., Disp: , Rfl:    albuterol  (VENTOLIN  HFA) 108 (90 Base) MCG/ACT inhaler, USE 2 PUFFS BY MOUTH EVERY FOUR HOURS, AS NEEDED, FOR COUGHING/WHEEZING, Disp: 8.5 g, Rfl: 0   allopurinol  (ZYLOPRIM ) 100 MG tablet, Take 150 mg by mouth daily., Disp: , Rfl:    amiodarone  (PACERONE ) 200 MG tablet, Take 0.5 tablets (100 mg total) by mouth daily., Disp: 30 tablet, Rfl: 1   budesonide -formoterol  (SYMBICORT ) 160-4.5 MCG/ACT inhaler, Inhale 2 puffs into the lungs 2 (two) times daily., Disp: 1 each, Rfl: 2   buPROPion  (WELLBUTRIN  XL) 300 MG 24 hr tablet, Take 300 mg by mouth every morning., Disp: , Rfl:    Cholecalciferol  (VITAMIN D3) 50 MCG (2000 UT) capsule, Take 1 capsule by mouth every morning., Disp: , Rfl:    colchicine  0.6 MG tablet, Take 1.2 mg by mouth See admin instructions. Take 1.2 mg for flair up and an hour later take 0.6 mg if needed, Disp: , Rfl:    Continuous Glucose Receiver (DEXCOM G7 RECEIVER) DEVI, 1 Device by Does not apply route continuous., Disp: 1 each, Rfl: 0   Continuous Glucose Sensor (DEXCOM G7 SENSOR) MISC, 1 Device by Does not apply route continuous., Disp: 9 each, Rfl: 3   Continuous Glucose Sensor (FREESTYLE LIBRE 3 PLUS SENSOR) MISC, as directed., Disp: , Rfl:    docusate sodium  (COLACE) 100 MG capsule, Take 2  capsules (200 mg total) by mouth at bedtime., Disp: 10 capsule, Rfl: 0   ELIQUIS  5 MG TABS tablet, TAKE 1 TABLET (5 MG TOTAL) BY MOUTH 2 (TWO) TIMES DAILY., Disp: 60 tablet, Rfl: 6   erythromycin ophthalmic ointment, Place 1 Application into the right eye 2 (two) times daily., Disp: , Rfl:    fluticasone  (FLONASE) 50 MCG/ACT nasal spray, Place 1 spray into both nostrils as needed for allergies or rhinitis., Disp: , Rfl:    folic acid  (FOLVITE ) 1 MG tablet, Take 1 tablet (1 mg total) by mouth daily., Disp: 30 tablet, Rfl: 0   gabapentin  (NEURONTIN ) 100 MG capsule, Take 1 capsule (100 mg total) by mouth 3 (three) times daily. (Patient not taking: Reported on 07/01/2024), Disp: 270 capsule, Rfl: 1   Glucagon  (BAQSIMI  ONE PACK) 3 MG/DOSE POWD, Place 1 Device into the nose as needed (Low blood sugar with impaired consciousness)., Disp: 2 each, Rfl: 3   insulin  lispro (  HUMALOG ) 100 UNIT/ML KwikPen, Inject 15 Units into the skin 3 (three) times daily with meals. Plus sliding scale, max dose 70 units /day, Disp: 25 mL, Rfl: 1   isosorbide -hydrALAZINE  (BIDIL ) 20-37.5 MG tablet, Take 1 tablet by mouth 3 (three) times daily., Disp: 270 tablet, Rfl: 3   LANTUS  SOLOSTAR 100 UNIT/ML Solostar Pen, Inject 30 Units into the skin daily., Disp: 15 mL, Rfl: 0   levothyroxine  (SYNTHROID ) 50 MCG tablet, TAKE 1 TABLET (50 MCG TOTAL) BY MOUTH DAILY BEFORE BREAKFAST (AM), Disp: 30 tablet, Rfl: 0   Menthol -Camphor (ICY HOT PRO NO MESS EX), Apply 1 Application topically 3 (three) times daily as needed (arms, neuropathy lower extrimity, gout in hand.)., Disp: , Rfl:    metolazone  (ZAROXOLYN ) 2.5 MG tablet, Take 1 tablet (2.5 mg total) by mouth daily. X 2 doses, 3rd dose if needed for continued fluid retention, Disp: 3 tablet, Rfl: 0   metoprolol  succinate (TOPROL -XL) 25 MG 24 hr tablet, Take 1 tablet (25 mg total) by mouth daily., Disp: 90 tablet, Rfl: 3   NYAMYC  powder, Apply 1 Application topically 2 (two) times daily., Disp: ,  Rfl:    nystatin  cream (MYCOSTATIN ), Apply 1 Application topically 2 (two) times daily as needed for dry skin., Disp: , Rfl:    Omega-3 Fatty Acids (FISH OIL PO), Take 1 tablet by mouth daily., Disp: , Rfl:    pantoprazole  (PROTONIX ) 40 MG tablet, TAKE 1 TABLET (40 MG TOTAL) BY MOUTH DAILY(AM), Disp: 30 tablet, Rfl: 0   polyethylene glycol (MIRALAX  / GLYCOLAX ) 17 g packet, Take 17 g by mouth daily as needed for moderate constipation., Disp: 14 each, Rfl: 0   potassium chloride  SA (KLOR-CON  M) 20 MEQ tablet, Take 1 tablet (20 mEq total) by mouth 2 (two) times daily., Disp: , Rfl:    rosuvastatin  (CRESTOR ) 40 MG tablet, Take 1 tablet (40 mg total) by mouth daily. (Patient taking differently: Take 40 mg by mouth at bedtime.), Disp: 90 tablet, Rfl: 3   tirzepatide  (MOUNJARO ) 7.5 MG/0.5ML Pen, Inject 7.5 mg into the skin once a week., Disp: 6 mL, Rfl: 1   torsemide  (DEMADEX ) 20 MG tablet, Take 2 tablets (40 mg total) by mouth 2 (two) times daily., Disp: , Rfl:    triamcinolone  ointment (KENALOG ) 0.1 %, Apply topically 2 (two) times daily., Disp: 454 g, Rfl: 0   VITAMIN E PO, Take 1 tablet by mouth daily., Disp: , Rfl:  Allergies  Allergen Reactions   Neurontin  [Gabapentin ] Nausea And Vomiting and Other (See Comments)    POSSIBLE SHAKING   Lyrica  [Pregabalin ] Other (See Comments)    Shaking    Other     Neurontin        Social History   Socioeconomic History   Marital status: Single    Spouse name: Not on file   Number of children: 1   Years of education: 12   Highest education level: 12th grade  Occupational History   Occupation: disabled  Tobacco Use   Smoking status: Every Day    Current packs/day: 1.00    Average packs/day: 1 pack/day for 44.0 years (44.0 ttl pk-yrs)    Types: E-cigarettes, Cigarettes   Smokeless tobacco: Former    Types: Snuff  Vaping Use   Vaping status: Former   Devices: 11/26/2018 stopped months ago  Substance and Sexual Activity   Alcohol use: Not  Currently    Comment: occ   Drug use: Yes    Types: Crack cocaine, Marijuana, Oxycodone   Sexual activity: Not Currently  Other Topics Concern   Not on file  Social History Narrative   ** Merged History Encounter **       Lives in [place], in motel with sister.  They are looking to move but don't have a place to go yet.     Social Drivers of Health   Financial Resource Strain: Low Risk  (02/09/2023)   Overall Financial Resource Strain (CARDIA)    Difficulty of Paying Living Expenses: Not hard at all  Food Insecurity: No Food Insecurity (03/09/2024)   Hunger Vital Sign    Worried About Running Out of Food in the Last Year: Never true    Ran Out of Food in the Last Year: Never true  Transportation Needs: Unmet Transportation Needs (03/09/2024)   PRAPARE - Transportation    Lack of Transportation (Medical): Yes    Lack of Transportation (Non-Medical): Yes  Physical Activity: Inactive (02/09/2023)   Exercise Vital Sign    Days of Exercise per Week: 0 days    Minutes of Exercise per Session: 0 min  Stress: No Stress Concern Present (02/09/2023)   [employer] of Occupational Health - Occupational Stress Questionnaire    Feeling of Stress : Not at all  Social Connections: Unknown (08/21/2023)   Received from [facility]   Social Network    Social Network: Not on file  Intimate Partner Violence: Not At Risk (03/09/2024)   Humiliation, Afraid, Rape, and Kick questionnaire    Fear of Current or Ex-Partner: No    Emotionally Abused: No    Physically Abused: No    Sexually Abused: No    Physical Exam      Future Appointments  Date Time Provider Department Center  07/30/2024  3:10 PM Aniceto Daphne CROME, NP [facility]  08/19/2024 11:40 AM Rolan Ezra RAMAN, MD [facility] None       [name], Paramedic [phone] [facility] Paramedic  07/29/24

## 2024-07-30 ENCOUNTER — Ambulatory Visit: Admitting: Pulmonary Disease

## 2024-07-30 ENCOUNTER — Telehealth: Payer: Self-pay | Admitting: *Deleted

## 2024-07-30 NOTE — Progress Notes (Unsigned)
 Electrophysiology Office Note:   Date:  07/31/2024  ID:  TERESSA MCGLOCKLIN, DOB [birth year], MRN [id number]  Primary Cardiologist: Dorn Lesches, MD Primary Heart Failure: Ezra Shuck, MD Electrophysiologist: Soyla Gladis Norton, MD      History of Present Illness:   [name] is a 63 y.o. female with h/o AF, HFrEF, CAD, HLD, carotid stenosis s/p CEA, PAD, tobacco abuse, COPD, gout, GIB seen today for routine electrophysiology followup.   Admitted 03/08/24 - 03/17/24 with chest pain and melena.  Pt had been using BC powder, Hgb was 5.3 on admit.  Endoscopy was negative for source of bleeding.  Colonoscopy showed multiple polyps. Seen in EP Clinic 04/29/24 and was doing well. No further bleeding, back on ELiquis  and Hgb up to 8gm.     She reports she has not had any further symptoms of AF.  No issues with bleeding on eliquis .  She reports she has been taking increased diuretics in the last 2 days. EP team alerted by Cardi[facility] monitoring staff that fluid status was up on 9/15 > she was given metolazone  and labs are pending. Her most recent Cr 2.55 on 07/25/24.  She states she weighs her self and has been ~218-220 lbs at home. She is 220lbs in clinic.  She reports she has gone to the bathroom a lot with the diuresis.  Her legs were so tight she had a hard time picking her leg up to put her shoe on and it is much better as of today (9/17).  No issues on amiodarone .    She denies chest pain, palpitations, dyspnea, PND, orthopnea, nausea, vomiting, dizziness, syncope, edema, weight gain, or early satiety.    Review of systems complete and found to be negative unless listed in HPI.   EP Information / Studies Reviewed:    EKG is not ordered today. EKG from 04/29/24 reviewed which showed NSR with sinus arrhythmia 67 bpm       Arrhythmia / AAD / Pertinent EP Studies AF    Risk Assessment/Calculations:    CHA2DS2-VASc Score = 5   This indicates a 7.2% annual risk of stroke. The patient's score  is based upon: CHF History: 1 HTN History: 1 Diabetes History: 1 Stroke History: 0 Vascular Disease History: 1 Age Score: 0 Gender Score: 1             Physical Exam:   VS:  BP 122/60 (BP Location: Right Arm, Patient Position: Sitting, Cuff Size: Normal)   Pulse 81   Ht 5' 3 (1.6 m)   Wt 220 lb (99.8 kg)   LMP  (LMP Unknown)   SpO2 95%   BMI 38.97 kg/m    Wt Readings from Last 3 Encounters:  07/31/24 220 lb (99.8 kg)  07/29/24 223 lb (101.2 kg)  07/24/24 220 lb (99.8 kg)     GEN: Well nourished, well developed in no acute distress NECK: No JVD; No carotid bruits CARDIAC: Regular rate and rhythm (SR by auscultation and palpation of pulse), no murmurs, rubs, gallops RESPIRATORY:  Clear to auscultation without rales, wheezing or rhonchi  ABDOMEN: Soft, non-tender, non-distended EXTREMITIES:  LE 1-2+ edema; No deformity   ASSESSMENT AND PLAN:    Paroxysmal Atrial Fibrillation  High Risk Medication Monitoring: Amiodarone   CHA2DS2-VASc 5, hx DCCV -OAC for stroke prophylaxis  -continue amiodarone  100 mg daily  -amio labs up to date as of 04/2024  -plan for amio labs in 10/2024 and then back to Q6 month monitoring  -Toprol  25 mg  daily   Secondary Hypercoagulable State  -continue Eliquis  5mg  BID, dose reviewed and appropriate by age / wt  -no further bleeding issues     HFmrEF  -follows with AHF Team  -alert of elevated fluid received on 07/29/24 > appears to be somewhat volume up in LE's but subjectively reports improvement.  No crackles on exam  -cardiorenal syndrome limits GDMT   CAD  PVD  -per Cardiology    Follow up with Dr. Inocencio in 3 months  Signed, Daphne Barrack, NP-C, AGACNP-BC [facility] - Electrophysiology  07/31/2024, 5:05 PM

## 2024-07-30 NOTE — Telephone Encounter (Signed)
 Returned call for additional questions. LVM.

## 2024-07-30 NOTE — Telephone Encounter (Signed)
 Copied from CRM [id number]. Topic: Clinical - Lab/Test Results >> Jul 29, 2024  2:24 PM [name] PARAS wrote: Reason for CRM:   Pt returning call concerning CT of chest results. Requested call back  CB#  [phone]

## 2024-07-31 ENCOUNTER — Telehealth: Payer: Self-pay

## 2024-07-31 ENCOUNTER — Telehealth (HOSPITAL_COMMUNITY): Payer: Self-pay

## 2024-07-31 ENCOUNTER — Ambulatory Visit: Attending: Cardiology | Admitting: Pulmonary Disease

## 2024-07-31 ENCOUNTER — Encounter: Payer: Self-pay | Admitting: Pulmonary Disease

## 2024-07-31 VITALS — BP 122/60 | HR 81 | Ht 63.0 in | Wt 220.0 lb

## 2024-07-31 DIAGNOSIS — I480 Paroxysmal atrial fibrillation: Secondary | ICD-10-CM

## 2024-07-31 DIAGNOSIS — I5022 Chronic systolic (congestive) heart failure: Secondary | ICD-10-CM | POA: Diagnosis not present

## 2024-07-31 DIAGNOSIS — D6869 Other thrombophilia: Secondary | ICD-10-CM

## 2024-07-31 DIAGNOSIS — I2510 Atherosclerotic heart disease of native coronary artery without angina pectoris: Secondary | ICD-10-CM | POA: Diagnosis not present

## 2024-07-31 NOTE — Telephone Encounter (Signed)
 Called to make pt lab appt in [facility]. Lvm to return call. Orders for bmet placed.

## 2024-07-31 NOTE — Patient Instructions (Signed)
 Medication Instructions:  No medications changes today   *If you need a refill on your cardiac medications before your next appointment, please call your pharmacy*  Lab Work: No lab work today If you have labs (blood work) drawn today and your tests are completely normal, you will receive your results only by: MyChart Message (if you have MyChart) OR A paper copy in the mail If you have any lab test that is abnormal or we need to change your treatment, we will call you to review the results.  Testing/Procedures: No testing/procedures were scheduled today  Follow-Up: At [facility], you and your health needs are our priority.  As part of our continuing mission to provide you with exceptional heart care, our providers are all part of one team.  This team includes your primary Cardiologist (physician) and Advanced Practice Providers or APPs (Physician Assistants and Nurse Practitioners) who all work together to provide you with the care you need, when you need it.  Your next appointment:   3 month(s)  Provider:   You may see Will Gladis Norton, MD or one of the following Advanced Practice Providers on your designated Care Team:    Daphne Barrack, NP    We recommend signing up for the patient portal called MyChart.  Sign up information is provided on this After Visit Summary.  MyChart is used to connect with patients for Virtual Visits (Telemedicine).  Patients are able to view lab/test results, encounter notes, upcoming appointments, etc.  Non-urgent messages can be sent to your provider as well.   To learn more about what you can do with MyChart, go to [email or web address].

## 2024-07-31 NOTE — Telephone Encounter (Signed)
 Lvm to return call

## 2024-08-01 ENCOUNTER — Other Ambulatory Visit (HOSPITAL_COMMUNITY): Payer: Self-pay

## 2024-08-01 NOTE — Telephone Encounter (Signed)
 Lab appt sch for 9/19

## 2024-08-01 NOTE — Telephone Encounter (Signed)
 Reached out to ms Talcott to see if I could come by for another assessment on how she is doing after the metolazone  use. No answer.  She did go to afib clinic yesterday.  Will try again tomor.  Looks like her weight in clinic was 220.  Need to get a weight done in the home for better comparison.   [name], EMT-Paramedic  [phone] 07/31/2024

## 2024-08-01 NOTE — Progress Notes (Addendum)
 Paramedicine Encounter    Patient ID: [name], female    DOB: 29-May-1961, 63 y.o.   MRN: [id number]   Complaints-denies   Edema-yes-bloated  Compliance with meds-missed one day of meds this week that had the metolazone  dose  Pill box filled-n/a If so, by whom-n/a  Refills needed-n/a  Came by for follow up from the metolazone  use this week. Her cardiomems going in right direction but still elevated, she did miss one day of her meds including the metolazone  day, since her reading did not go down that much after one dose, tina feels like that she needs to take the missed metolazone  dose today, which was given to her and take the 3rd dose tomor so that was placed in pill box. Her abd does appear bloated and she feels bloated.  Weight going in right direction but she is still up. Will f/u on Monday.   BP 124/60   Pulse 75   Resp 18   Wt 220 lb (99.8 kg)   LMP  (LMP Unknown)   SpO2 98%   BMI 38.97 kg/m  Weight yesterday-? Last visit weight-223  Patient Care Team: Campbell Reynolds, NP as PCP - General Court Dorn PARAS, MD as PCP - Cardiology (Cardiology) Rolan Ezra GORMAN, MD as PCP - Advanced Heart Failure (Cardiology) Inocencio Soyla Lunger, MD as PCP - Electrophysiology (Cardiology) Court Dorn PARAS, MD as Consulting Physician (Cardiology) Darlean Ozell NOVAK, MD as Consulting Physician (Pulmonary Disease) System, Provider Not In  Patient Active Problem List   Diagnosis Date Noted   Persistent atrial fibrillation ([name]) 03/15/2024   Benign neoplasm of ascending colon 03/14/2024   Benign neoplasm of transverse colon 03/14/2024   Benign neoplasm of descending colon 03/14/2024   Benign neoplasm of sigmoid colon 03/14/2024   Chronic systolic congestive heart failure ([name]) 03/12/2024   Melena 03/12/2024   Heme positive stool 03/11/2024   GI bleed 03/08/2024   History of CAD (coronary artery disease) 03/08/2024   CKD (chronic kidney disease), stage IV ([name]) 03/08/2024    Continuous dependence on cigarette smoking 03/08/2024   GAD (generalized anxiety disorder) 03/08/2024   HFrEF (heart failure with reduced ejection fraction) ([name]) 10/11/2023   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) and diastolic (congestive) heart failure ([name]) 10/10/2023   Pre-ulcerative calluses 04/25/2022   PAF (paroxysmal atrial fibrillation) ([name]) 05/05/2021   COPD (chronic obstructive pulmonary disease) ([name]) 05/05/2021   OSA (obstructive sleep apnea) 05/05/2021   Stage 3b chronic kidney disease ([name]) 05/05/2021   Pressure injury of skin 05/04/2021   Diabetic nephropathy ([name]) 01/02/2021   Low back pain 06/01/2020   Hyperlipidemia 04/03/2020   Peripheral artery disease ([name]) 12/26/2019   NICM (nonischemic cardiomyopathy) ([name]) 06/20/2019   Non-healing ulcer ([name]) 06/20/2019   Coagulation disorder ([name]) 08/09/2017   Depression 07/21/2017   At risk for adverse drug reaction 06/20/2017   Peripheral neuropathy 06/20/2017   S/P transmetatarsal amputation of foot, right ([name]) 06/05/2017   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) 05/19/2017   Obesity, class 2 02/24/2016   Anticoagulation management encounter 02/10/2016   Chronic sinus bradycardia 01/12/2016   Essential hypertension 12/22/2015   Demand ischemia ([name])    Acute on chronic diastolic CHF (congestive heart failure) ([name])    Symptomatic anemia 11/08/2015   Hypokalemia 11/08/2015   Tobacco abuse 10/23/2015   Coronary artery disease    DOE (dyspnea on exertion) 04/29/2015   Paroxysmal atrial fibrillation ([name]) 01/16/2015   Carotid artery stenosis  01/16/2015   Insomnia 02/03/2014   S/P peripheral artery angioplasty - TurboHawk atherectomy; R SFA 09/11/2013    Class: Acute   Leg pain, bilateral 08/19/2013   Hypothyroidism 07/31/2013   History of cocaine abuse ([name]) 06/13/2013   Long term current use of anticoagulant therapy 05/20/2013   Alcohol abuse     Narcotic abuse ([name])    Marijuana abuse    Alcoholic cirrhosis ([name])    Insulin  dependent type 2 diabetes mellitus ([name])     Current Outpatient Medications:    acetaminophen  (TYLENOL ) 500 MG tablet, Take 1,000 mg by mouth every 6 (six) hours as needed for headache (pain)., Disp: , Rfl:    albuterol  (VENTOLIN  HFA) 108 (90 Base) MCG/ACT inhaler, USE 2 PUFFS BY MOUTH EVERY FOUR HOURS, AS NEEDED, FOR COUGHING/WHEEZING, Disp: 8.5 g, Rfl: 0   allopurinol  (ZYLOPRIM ) 100 MG tablet, Take 150 mg by mouth daily., Disp: , Rfl:    amiodarone  (PACERONE ) 200 MG tablet, Take 0.5 tablets (100 mg total) by mouth daily., Disp: 30 tablet, Rfl: 1   budesonide -formoterol  (SYMBICORT ) 160-4.5 MCG/ACT inhaler, Inhale 2 puffs into the lungs 2 (two) times daily., Disp: 1 each, Rfl: 2   buPROPion  (WELLBUTRIN  XL) 300 MG 24 hr tablet, Take 300 mg by mouth every morning., Disp: , Rfl:    Cholecalciferol  (VITAMIN D3) 50 MCG (2000 UT) capsule, Take 1 capsule by mouth every morning., Disp: , Rfl:    colchicine  0.6 MG tablet, Take 1.2 mg by mouth See admin instructions. Take 1.2 mg for flair up and an hour later take 0.6 mg if needed, Disp: , Rfl:    Continuous Glucose Receiver (DEXCOM G7 RECEIVER) DEVI, 1 Device by Does not apply route continuous., Disp: 1 each, Rfl: 0   Continuous Glucose Sensor (DEXCOM G7 SENSOR) MISC, 1 Device by Does not apply route continuous., Disp: 9 each, Rfl: 3   Continuous Glucose Sensor (FREESTYLE LIBRE 3 PLUS SENSOR) MISC, as directed., Disp: , Rfl:    docusate sodium  (COLACE) 100 MG capsule, Take 2 capsules (200 mg total) by mouth at bedtime., Disp: 10 capsule, Rfl: 0   ELIQUIS  5 MG TABS tablet, TAKE 1 TABLET (5 MG TOTAL) BY MOUTH 2 (TWO) TIMES DAILY., Disp: 60 tablet, Rfl: 6   erythromycin ophthalmic ointment, Place 1 Application into the right eye 2 (two) times daily., Disp: , Rfl:    fluticasone  (FLONASE) 50 MCG/ACT nasal spray, Place 1 spray into both nostrils as needed for allergies or  rhinitis., Disp: , Rfl:    folic acid  (FOLVITE ) 1 MG tablet, Take 1 tablet (1 mg total) by mouth daily., Disp: 30 tablet, Rfl: 0   gabapentin  (NEURONTIN ) 100 MG capsule, Take 1 capsule (100 mg total) by mouth 3 (three) times daily., Disp: 270 capsule, Rfl: 1   Glucagon  (BAQSIMI  ONE PACK) 3 MG/DOSE POWD, Place 1 Device into the nose as needed (Low blood sugar with impaired consciousness)., Disp: 2 each, Rfl: 3   insulin  lispro (HUMALOG ) 100 UNIT/ML KwikPen, Inject 15 Units into the skin 3 (three) times daily with meals. Plus sliding scale, max dose 70 units /day, Disp: 25 mL, Rfl: 1   isosorbide -hydrALAZINE  (BIDIL ) 20-37.5 MG tablet, Take 1 tablet by mouth 3 (three) times daily., Disp: 270 tablet, Rfl: 3   LANTUS  SOLOSTAR 100 UNIT/ML Solostar Pen, Inject 30 Units into the skin daily., Disp: 15 mL, Rfl: 0   levothyroxine  (SYNTHROID ) 50 MCG tablet, TAKE 1 TABLET (50 MCG TOTAL) BY MOUTH DAILY BEFORE BREAKFAST (AM), Disp: 30 tablet,  Rfl: 0   Menthol -Camphor (ICY HOT PRO NO MESS EX), Apply 1 Application topically 3 (three) times daily as needed (arms, neuropathy lower extrimity, gout in hand.)., Disp: , Rfl:    metolazone  (ZAROXOLYN ) 2.5 MG tablet, Take 1 tablet (2.5 mg total) by mouth daily. X 2 doses, 3rd dose if needed for continued fluid retention, Disp: 3 tablet, Rfl: 0   metoprolol  succinate (TOPROL -XL) 25 MG 24 hr tablet, Take 1 tablet (25 mg total) by mouth daily., Disp: 90 tablet, Rfl: 3   NYAMYC  powder, Apply 1 Application topically 2 (two) times daily., Disp: , Rfl:    nystatin  cream (MYCOSTATIN ), Apply 1 Application topically 2 (two) times daily as needed for dry skin., Disp: , Rfl:    Omega-3 Fatty Acids (FISH OIL PO), Take 1 tablet by mouth daily., Disp: , Rfl:    pantoprazole  (PROTONIX ) 40 MG tablet, TAKE 1 TABLET (40 MG TOTAL) BY MOUTH DAILY(AM), Disp: 30 tablet, Rfl: 0   polyethylene glycol (MIRALAX  / GLYCOLAX ) 17 g packet, Take 17 g by mouth daily as needed for moderate constipation.,  Disp: 14 each, Rfl: 0   potassium chloride  SA (KLOR-CON  M) 20 MEQ tablet, Take 1 tablet (20 mEq total) by mouth 2 (two) times daily., Disp: , Rfl:    rosuvastatin  (CRESTOR ) 40 MG tablet, Take 1 tablet (40 mg total) by mouth daily. (Patient taking differently: Take 40 mg by mouth at bedtime.), Disp: 90 tablet, Rfl: 3   tirzepatide  (MOUNJARO ) 7.5 MG/0.5ML Pen, Inject 7.5 mg into the skin once a week., Disp: 6 mL, Rfl: 1   torsemide  (DEMADEX ) 20 MG tablet, Take 2 tablets (40 mg total) by mouth 2 (two) times daily., Disp: , Rfl:    triamcinolone  ointment (KENALOG ) 0.1 %, Apply topically 2 (two) times daily., Disp: 454 g, Rfl: 0   VITAMIN E PO, Take 1 tablet by mouth daily., Disp: , Rfl:  Allergies  Allergen Reactions   Neurontin  [Gabapentin ] Nausea And Vomiting and Other (See Comments)    POSSIBLE SHAKING   Lyrica  [Pregabalin ] Other (See Comments)    Shaking    Other     Neurontin        Social History   Socioeconomic History   Marital status: Single    Spouse name: Not on file   Number of children: 1   Years of education: 12   Highest education level: 12th grade  Occupational History   Occupation: disabled  Tobacco Use   Smoking status: Every Day    Current packs/day: 1.00    Average packs/day: 1 pack/day for 44.0 years (44.0 ttl pk-yrs)    Types: E-cigarettes, Cigarettes   Smokeless tobacco: Former    Types: Snuff  Vaping Use   Vaping status: Former   Devices: 11/26/2018 stopped months ago  Substance and Sexual Activity   Alcohol use: Not Currently    Comment: occ   Drug use: Yes    Types: Crack cocaine, Marijuana, Oxycodone    Sexual activity: Not Currently  Other Topics Concern   Not on file  Social History Narrative   ** Merged History Encounter **       Lives in [place], in motel with sister.  They are looking to move but don't have a place to go yet.     Social Drivers of Corporate investment banker Strain: Low Risk  (02/09/2023)   Overall Financial Resource  Strain (CARDIA)    Difficulty of Paying Living Expenses: Not hard at all  Food Insecurity: No Food  Insecurity (03/09/2024)   Hunger Vital Sign    Worried About Running Out of Food in the Last Year: Never true    Ran Out of Food in the Last Year: Never true  Transportation Needs: Unmet Transportation Needs (03/09/2024)   PRAPARE - Transportation    Lack of Transportation (Medical): Yes    Lack of Transportation (Non-Medical): Yes  Physical Activity: Inactive (02/09/2023)   Exercise Vital Sign    Days of Exercise per Week: 0 days    Minutes of Exercise per Session: 0 min  Stress: No Stress Concern Present (02/09/2023)   [employer] of Occupational Health - Occupational Stress Questionnaire    Feeling of Stress : Not at all  Social Connections: Unknown (08/21/2023)   Received from [facility]   Social Network    Social Network: Not on file  Intimate Partner Violence: Not At Risk (03/09/2024)   Humiliation, Afraid, Rape, and Kick questionnaire    Fear of Current or Ex-Partner: No    Emotionally Abused: No    Physically Abused: No    Sexually Abused: No    Physical Exam      Future Appointments  Date Time Provider Department Center  08/19/2024 11:40 AM Rolan Ezra RAMAN, MD [facility] None  10/31/2024 10:30 AM Aniceto Daphne CROME, NP [facility]       [name], Paramedic [phone] [facility] Paramedic  08/01/24

## 2024-08-02 ENCOUNTER — Ambulatory Visit: Payer: Self-pay | Admitting: Family

## 2024-08-02 ENCOUNTER — Ambulatory Visit (HOSPITAL_COMMUNITY)
Admission: RE | Admit: 2024-08-02 | Discharge: 2024-08-02 | Disposition: A | Discharge status: Home / Self Care | Source: Ambulatory Visit | Attending: Internal Medicine | Admitting: Internal Medicine

## 2024-08-02 DIAGNOSIS — I5022 Chronic systolic (congestive) heart failure: Secondary | ICD-10-CM | POA: Insufficient documentation

## 2024-08-02 LAB — BASIC METABOLIC PANEL WITH GFR
Anion gap: 10 (ref 5–15)
BUN: 68 mg/dL — ABNORMAL HIGH (ref 8–23)
CO2: 26 mmol/L (ref 22–32)
Calcium: 8.7 mg/dL — ABNORMAL LOW (ref 8.9–10.3)
Chloride: 103 mmol/L (ref 98–111)
Creatinine, Ser: 2.7 mg/dL — ABNORMAL HIGH (ref 0.44–1.00)
GFR, Estimated: 19 mL/min — ABNORMAL LOW (ref >60)
Glucose, Bld: 167 mg/dL — ABNORMAL HIGH (ref 70–99)
Potassium: 4 mmol/L (ref 3.5–5.1)
Sodium: 139 mmol/L (ref 135–145)

## 2024-08-05 ENCOUNTER — Other Ambulatory Visit (HOSPITAL_COMMUNITY): Payer: Self-pay

## 2024-08-05 NOTE — Progress Notes (Signed)
 Paramedicine Encounter    Patient ID: [name], female    DOB: Dec 19, 1960, 63 y.o.   MRN: [id number]   Complaints-chronic indigestion.burping/fried fish  Edema-much improved   Compliance with meds-yes  Pill box filled-yes  If so, by whom-paramedic   Refills needed- Levothyroxine   Wellbutrin  Flonase  Metoprolol   Pantoprazole       Her freestyle was alerting that her CBG was 69, she had boost this morning, rechecked by meter and it was 126.  She denies feeling bad.  Wheezing on left upper and lower lungs, she just used her inhaler.  She denies increased sob, no dizziness, no c/p.  She reports of having burping and indigestion. After she burps she does feel better. This has been ongoing for a very long time.  She denies bleeding issues.  None noted in BM.  She reports urine kind of dark and stronger smell, she reported drinking 16oz of water  daily and then teas. So I encouraged her to increase the water  intake some and replace the tea with water .  She denies pain or discomfort upon urination. No fevers.   Per [name], her labs are ok after using metolazone .  She has f/u on 10/6.   Meds verified and pill box refilled.  Will f/u next week.   BP 130/62   Pulse 70   Resp 17   Wt 216 lb (98 kg)   LMP  (LMP Unknown)   SpO2 99%   BMI 38.26 kg/m  Weight yesterday-216? Last visit weight-220  Patient Care Team: Campbell Reynolds, NP as PCP - General Court Dorn PARAS, MD as PCP - Cardiology (Cardiology) Rolan Ezra GORMAN, MD as PCP - Advanced Heart Failure (Cardiology) Inocencio Soyla Lunger, MD as PCP - Electrophysiology (Cardiology) Court Dorn PARAS, MD as Consulting Physician (Cardiology) Darlean Ozell NOVAK, MD as Consulting Physician (Pulmonary Disease) System, Provider Not In  Patient Active Problem List   Diagnosis Date Noted   Persistent atrial fibrillation ([name]) 03/15/2024   Benign neoplasm of ascending colon 03/14/2024   Benign neoplasm of transverse colon 03/14/2024    Benign neoplasm of descending colon 03/14/2024   Benign neoplasm of sigmoid colon 03/14/2024   Chronic systolic congestive heart failure ([name]) 03/12/2024   Melena 03/12/2024   Heme positive stool 03/11/2024   GI bleed 03/08/2024   History of CAD (coronary artery disease) 03/08/2024   CKD (chronic kidney disease), stage IV ([name]) 03/08/2024   Continuous dependence on cigarette smoking 03/08/2024   GAD (generalized anxiety disorder) 03/08/2024   HFrEF (heart failure with reduced ejection fraction) ([name]) 10/11/2023   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) and diastolic (congestive) heart failure ([name]) 10/10/2023   Pre-ulcerative calluses 04/25/2022   PAF (paroxysmal atrial fibrillation) ([name]) 05/05/2021   COPD (chronic obstructive pulmonary disease) ([name]) 05/05/2021   OSA (obstructive sleep apnea) 05/05/2021   Stage 3b chronic kidney disease ([name]) 05/05/2021   Pressure injury of skin 05/04/2021   Diabetic nephropathy ([name]) 01/02/2021   Low back pain 06/01/2020   Hyperlipidemia 04/03/2020   Peripheral artery disease ([name]) 12/26/2019   NICM (nonischemic cardiomyopathy) ([name]) 06/20/2019   Non-healing ulcer ([name]) 06/20/2019   Coagulation disorder ([name]) 08/09/2017   Depression 07/21/2017   At risk for adverse drug reaction 06/20/2017   Peripheral neuropathy 06/20/2017   S/P transmetatarsal amputation of foot, right ([name]) 06/05/2017   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) 05/19/2017   Obesity, class 2 02/24/2016   Anticoagulation management encounter 02/10/2016   Chronic  sinus bradycardia 01/12/2016   Essential hypertension 12/22/2015   Demand ischemia ([name])    Acute on chronic diastolic CHF (congestive heart failure) ([name])    Symptomatic anemia 11/08/2015   Hypokalemia 11/08/2015   Tobacco abuse 10/23/2015   Coronary artery disease    DOE (dyspnea on exertion) 04/29/2015   Paroxysmal atrial fibrillation ([name])  01/16/2015   Carotid artery stenosis 01/16/2015   Insomnia 02/03/2014   S/P peripheral artery angioplasty - TurboHawk atherectomy; R SFA 09/11/2013    Class: Acute   Leg pain, bilateral 08/19/2013   Hypothyroidism 07/31/2013   History of cocaine abuse ([name]) 06/13/2013   Long term current use of anticoagulant therapy 05/20/2013   Alcohol abuse    Narcotic abuse ([name])    Marijuana abuse    Alcoholic cirrhosis ([name])    Insulin  dependent type 2 diabetes mellitus ([name])     Current Outpatient Medications:    acetaminophen  (TYLENOL ) 500 MG tablet, Take 1,000 mg by mouth every 6 (six) hours as needed for headache (pain)., Disp: , Rfl:    albuterol  (VENTOLIN  HFA) 108 (90 Base) MCG/ACT inhaler, USE 2 PUFFS BY MOUTH EVERY FOUR HOURS, AS NEEDED, FOR COUGHING/WHEEZING, Disp: 8.5 g, Rfl: 0   allopurinol  (ZYLOPRIM ) 100 MG tablet, Take 150 mg by mouth daily., Disp: , Rfl:    amiodarone  (PACERONE ) 200 MG tablet, Take 0.5 tablets (100 mg total) by mouth daily., Disp: 30 tablet, Rfl: 1   budesonide -formoterol  (SYMBICORT ) 160-4.5 MCG/ACT inhaler, Inhale 2 puffs into the lungs 2 (two) times daily., Disp: 1 each, Rfl: 2   buPROPion  (WELLBUTRIN  XL) 300 MG 24 hr tablet, Take 300 mg by mouth every morning., Disp: , Rfl:    Cholecalciferol  (VITAMIN D3) 50 MCG (2000 UT) capsule, Take 1 capsule by mouth every morning., Disp: , Rfl:    colchicine  0.6 MG tablet, Take 1.2 mg by mouth See admin instructions. Take 1.2 mg for flair up and an hour later take 0.6 mg if needed, Disp: , Rfl:    Continuous Glucose Sensor (FREESTYLE LIBRE 3 PLUS SENSOR) MISC, as directed., Disp: , Rfl:    ELIQUIS  5 MG TABS tablet, TAKE 1 TABLET (5 MG TOTAL) BY MOUTH 2 (TWO) TIMES DAILY., Disp: 60 tablet, Rfl: 6   fluticasone  (FLONASE) 50 MCG/ACT nasal spray, Place 1 spray into both nostrils as needed for allergies or rhinitis., Disp: , Rfl:    insulin  lispro (HUMALOG ) 100 UNIT/ML KwikPen, Inject 15 Units into the skin 3 (three) times daily with  meals. Plus sliding scale, max dose 70 units /day, Disp: 25 mL, Rfl: 1   isosorbide -hydrALAZINE  (BIDIL ) 20-37.5 MG tablet, Take 1 tablet by mouth 3 (three) times daily., Disp: 270 tablet, Rfl: 3   LANTUS  SOLOSTAR 100 UNIT/ML Solostar Pen, Inject 30 Units into the skin daily., Disp: 15 mL, Rfl: 0   levothyroxine  (SYNTHROID ) 50 MCG tablet, TAKE 1 TABLET (50 MCG TOTAL) BY MOUTH DAILY BEFORE BREAKFAST (AM), Disp: 30 tablet, Rfl: 0   metoprolol  succinate (TOPROL -XL) 25 MG 24 hr tablet, Take 1 tablet (25 mg total) by mouth daily., Disp: 90 tablet, Rfl: 3   Omega-3 Fatty Acids (FISH OIL PO), Take 1 tablet by mouth daily., Disp: , Rfl:    pantoprazole  (PROTONIX ) 40 MG tablet, TAKE 1 TABLET (40 MG TOTAL) BY MOUTH DAILY(AM), Disp: 30 tablet, Rfl: 0   potassium chloride  SA (KLOR-CON  M) 20 MEQ tablet, Take 1 tablet (20 mEq total) by mouth 2 (two) times daily., Disp: , Rfl:    tirzepatide  (MOUNJARO ) 7.5  MG/0.5ML Pen, Inject 7.5 mg into the skin once a week., Disp: 6 mL, Rfl: 1   torsemide  (DEMADEX ) 20 MG tablet, Take 2 tablets (40 mg total) by mouth 2 (two) times daily., Disp: , Rfl:    VITAMIN E PO, Take 1 tablet by mouth daily., Disp: , Rfl:    Continuous Glucose Receiver (DEXCOM G7 RECEIVER) DEVI, 1 Device by Does not apply route continuous., Disp: 1 each, Rfl: 0   Continuous Glucose Sensor (DEXCOM G7 SENSOR) MISC, 1 Device by Does not apply route continuous., Disp: 9 each, Rfl: 3   docusate sodium  (COLACE) 100 MG capsule, Take 2 capsules (200 mg total) by mouth at bedtime., Disp: 10 capsule, Rfl: 0   erythromycin ophthalmic ointment, Place 1 Application into the right eye 2 (two) times daily., Disp: , Rfl:    folic acid  (FOLVITE ) 1 MG tablet, Take 1 tablet (1 mg total) by mouth daily., Disp: 30 tablet, Rfl: 0   gabapentin  (NEURONTIN ) 100 MG capsule, Take 1 capsule (100 mg total) by mouth 3 (three) times daily., Disp: 270 capsule, Rfl: 1   Glucagon  (BAQSIMI  ONE PACK) 3 MG/DOSE POWD, Place 1 Device into the  nose as needed (Low blood sugar with impaired consciousness)., Disp: 2 each, Rfl: 3   Menthol -Camphor (ICY HOT PRO NO MESS EX), Apply 1 Application topically 3 (three) times daily as needed (arms, neuropathy lower extrimity, gout in hand.)., Disp: , Rfl:    metolazone  (ZAROXOLYN ) 2.5 MG tablet, Take 1 tablet (2.5 mg total) by mouth daily. X 2 doses, 3rd dose if needed for continued fluid retention, Disp: 3 tablet, Rfl: 0   NYAMYC  powder, Apply 1 Application topically 2 (two) times daily., Disp: , Rfl:    nystatin  cream (MYCOSTATIN ), Apply 1 Application topically 2 (two) times daily as needed for dry skin., Disp: , Rfl:    polyethylene glycol (MIRALAX  / GLYCOLAX ) 17 g packet, Take 17 g by mouth daily as needed for moderate constipation., Disp: 14 each, Rfl: 0   rosuvastatin  (CRESTOR ) 40 MG tablet, Take 1 tablet (40 mg total) by mouth daily. (Patient taking differently: Take 40 mg by mouth at bedtime.), Disp: 90 tablet, Rfl: 3   triamcinolone  ointment (KENALOG ) 0.1 %, Apply topically 2 (two) times daily., Disp: 454 g, Rfl: 0 Allergies  Allergen Reactions   Neurontin  [Gabapentin ] Nausea And Vomiting and Other (See Comments)    POSSIBLE SHAKING   Lyrica  [Pregabalin ] Other (See Comments)    Shaking    Other     Neurontin        Social History   Socioeconomic History   Marital status: Single    Spouse name: Not on file   Number of children: 1   Years of education: 12   Highest education level: 12th grade  Occupational History   Occupation: disabled  Tobacco Use   Smoking status: Every Day    Current packs/day: 1.00    Average packs/day: 1 pack/day for 44.0 years (44.0 ttl pk-yrs)    Types: E-cigarettes, Cigarettes   Smokeless tobacco: Former    Types: Snuff  Vaping Use   Vaping status: Former   Devices: 11/26/2018 stopped months ago  Substance and Sexual Activity   Alcohol use: Not Currently    Comment: occ   Drug use: Yes    Types: Crack cocaine, Marijuana, Oxycodone     Sexual activity: Not Currently  Other Topics Concern   Not on file  Social History Narrative   ** Merged History Encounter **  Lives in [place], in motel with sister.  They are looking to move but don't have a place to go yet.     Social Drivers of Health   Financial Resource Strain: Low Risk  (02/09/2023)   Overall Financial Resource Strain (CARDIA)    Difficulty of Paying Living Expenses: Not hard at all  Food Insecurity: No Food Insecurity (03/09/2024)   Hunger Vital Sign    Worried About Running Out of Food in the Last Year: Never true    Ran Out of Food in the Last Year: Never true  Transportation Needs: Unmet Transportation Needs (03/09/2024)   PRAPARE - Transportation    Lack of Transportation (Medical): Yes    Lack of Transportation (Non-Medical): Yes  Physical Activity: Inactive (02/09/2023)   Exercise Vital Sign    Days of Exercise per Week: 0 days    Minutes of Exercise per Session: 0 min  Stress: No Stress Concern Present (02/09/2023)   [employer] of Occupational Health - Occupational Stress Questionnaire    Feeling of Stress : Not at all  Social Connections: Unknown (08/21/2023)   Received from [facility]   Social Network    Social Network: Not on file  Intimate Partner Violence: Not At Risk (03/09/2024)   Humiliation, Afraid, Rape, and Kick questionnaire    Fear of Current or Ex-Partner: No    Emotionally Abused: No    Physically Abused: No    Sexually Abused: No    Physical Exam      Future Appointments  Date Time Provider Department Center  08/19/2024 11:40 AM Rolan Ezra RAMAN, MD [facility] None  10/31/2024 10:30 AM Aniceto Daphne CROME, NP [facility]       [name], Paramedic [phone] [facility] Paramedic  08/05/24

## 2024-08-06 ENCOUNTER — Other Ambulatory Visit (HOSPITAL_COMMUNITY): Payer: Self-pay | Admitting: Internal Medicine

## 2024-08-06 ENCOUNTER — Telehealth (HOSPITAL_COMMUNITY): Payer: Self-pay | Admitting: Pharmacy Technician

## 2024-08-06 DIAGNOSIS — N189 Chronic kidney disease, unspecified: Secondary | ICD-10-CM | POA: Insufficient documentation

## 2024-08-06 DIAGNOSIS — D631 Anemia in chronic kidney disease: Secondary | ICD-10-CM

## 2024-08-06 NOTE — Progress Notes (Signed)
 Spoke w/ Dr. Macel and he is ok with us  switching Venofer  dose. He would like patient to receive 300 mg IV x 4 doses.  Liese Dizdarevic D. Hayzel Ruberg, PharmD

## 2024-08-06 NOTE — Telephone Encounter (Signed)
 Auth Submission: NO AUTH NEEDED Site of care: [facility] Payer: [employer] MEDICARE DUAL Medication & CPT/J Code(s) submitted: Venofer  (Iron  Sucrose) J1756 Diagnosis Code: N18.3, D63.1 Route of submission (phone, fax, portal):  Phone # Fax # Auth type: Buy/Bill HB Units/visits requested: 300mg  x 4 doses Reference number: [id number] Approval from: 08/06/24 to 11/13/24    Dagoberto Armour, CPhT [facility] Phone: [phone] 08/06/2024

## 2024-08-07 ENCOUNTER — Telehealth (HOSPITAL_COMMUNITY): Payer: Self-pay

## 2024-08-07 NOTE — Telephone Encounter (Signed)
 Contacted [facility] to request the following for refills:  Levothyroxine   Wellbutrin  Flonase  Metoprolol   Pantoprazole     [name], Paramedic [phone] 08/07/24

## 2024-08-12 ENCOUNTER — Other Ambulatory Visit (HOSPITAL_COMMUNITY): Payer: Self-pay

## 2024-08-12 ENCOUNTER — Other Ambulatory Visit (HOSPITAL_COMMUNITY): Payer: Self-pay | Admitting: Cardiology

## 2024-08-12 NOTE — Progress Notes (Signed)
 Paramedicine Encounter    Patient ID: [name], female    DOB: 1961-10-03, 63 y.o.   MRN: [id number]   Complaints-denies   Edema-yes-rt leG more than left as usual   Compliance with meds-yes  Pill box filled-yes If so, by whom-paramedic   Refills needed-waiting on delivery   Wellbutrin -needs in fri and sat am dose  Metoprolol -needs in fri and sat am dose  Pantoprazole -needs in sat am dose    Eliquis -for next week     Pt reports she is doing ok. She felt bloated yesterday but has been constipated too.  She still needs to go out and get the stool softener or miralax .   I peeked at her cardiomems and her numbers are within goal.   She reports her CBG had a reading of 29 and it was around 9am-she denies any symptoms from that. She did eat at that time and it came up.  She goes to PCP on 10/3.  Pt denies increased sob, no dizziness, no c/p.  No bleeding upon BM.  Urinating ok and appetite ok.  Meds verified and pill box refilled.   Will have to come back out this week for med rec.   She will be getting the iron  infusions the next 4 Mondays at 11 so will come here on Monday at 9.   CBG PTA-157 BP (!) 114/50   Pulse 62   Resp 18   Wt 217 lb (98.4 kg)   LMP  (LMP Unknown)   SpO2 96%   BMI 38.44 kg/m  Weight yesterday-216 Last visit weight-216  Patient Care Team: Campbell Reynolds, NP as PCP - [address] Court Dorn PARAS, MD as PCP - Cardiology (Cardiology) Rolan Ezra GORMAN, MD as PCP - Advanced Heart Failure (Cardiology) Inocencio Soyla Lunger, MD as PCP - Electrophysiology (Cardiology) Court Dorn PARAS, MD as Consulting Physician (Cardiology) Darlean Ozell NOVAK, MD as Consulting Physician (Pulmonary Disease) System, Provider Not In  Patient Active Problem List   Diagnosis Date Noted   Anemia of chronic renal failure 08/06/2024   Persistent atrial fibrillation ([name]) 03/15/2024   Benign neoplasm of ascending colon 03/14/2024   Benign neoplasm of transverse colon  03/14/2024   Benign neoplasm of descending colon 03/14/2024   Benign neoplasm of sigmoid colon 03/14/2024   Chronic systolic congestive heart failure ([name]) 03/12/2024   Melena 03/12/2024   Heme positive stool 03/11/2024   GI bleed 03/08/2024   History of CAD (coronary artery disease) 03/08/2024   CKD (chronic kidney disease), stage IV ([name]) 03/08/2024   Continuous dependence on cigarette smoking 03/08/2024   GAD (generalized anxiety disorder) 03/08/2024   HFrEF (heart failure with reduced ejection fraction) ([name]) 10/11/2023   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) and diastolic (congestive) heart failure ([name]) 10/10/2023   Pre-ulcerative calluses 04/25/2022   PAF (paroxysmal atrial fibrillation) ([name]) 05/05/2021   COPD (chronic obstructive pulmonary disease) ([name]) 05/05/2021   OSA (obstructive sleep apnea) 05/05/2021   Stage 3b chronic kidney disease ([name]) 05/05/2021   Pressure injury of skin 05/04/2021   Diabetic nephropathy ([name]) 01/02/2021   Low back pain 06/01/2020   Hyperlipidemia 04/03/2020   Peripheral artery disease 12/26/2019   NICM (nonischemic cardiomyopathy) ([name]) 06/20/2019   Non-healing ulcer ([name]) 06/20/2019   Coagulation disorder 08/09/2017   Depression 07/21/2017   At risk for adverse drug reaction 06/20/2017   Peripheral neuropathy 06/20/2017   S/P transmetatarsal amputation of foot, right ([name]) 06/05/2017   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 with ulcer  and inflammation ([name]) 05/19/2017   Obesity, class 2 02/24/2016   Anticoagulation management encounter 02/10/2016   Chronic sinus bradycardia 01/12/2016   Essential hypertension 12/22/2015   Demand ischemia ([name])    Acute on chronic diastolic CHF (congestive heart failure) ([name])    Symptomatic anemia 11/08/2015   Hypokalemia 11/08/2015   Tobacco abuse 10/23/2015   Coronary artery disease    DOE (dyspnea on exertion) 04/29/2015   Paroxysmal atrial fibrillation ([name])  01/16/2015   Carotid artery stenosis 01/16/2015   Insomnia 02/03/2014   S/P peripheral artery angioplasty - TurboHawk atherectomy; R SFA 09/11/2013    Class: Acute   Leg pain, bilateral 08/19/2013   Hypothyroidism 07/31/2013   History of cocaine abuse ([name]) 06/13/2013   Long term current use of anticoagulant therapy 05/20/2013   Alcohol abuse    Narcotic abuse ([name])    Marijuana abuse    Alcoholic cirrhosis ([name])    Insulin  dependent type 2 diabetes mellitus ([name])     Current Outpatient Medications:    acetaminophen  (TYLENOL ) 500 MG tablet, Take 1,000 mg by mouth every 6 (six) hours as needed for headache (pain)., Disp: , Rfl:    albuterol  (VENTOLIN  HFA) 108 (90 Base) MCG/ACT inhaler, USE 2 PUFFS BY MOUTH EVERY FOUR HOURS, AS NEEDED, FOR COUGHING/WHEEZING, Disp: 8.5 g, Rfl: 0   allopurinol  (ZYLOPRIM ) 100 MG tablet, Take 150 mg by mouth daily., Disp: , Rfl:    amiodarone  (PACERONE ) 200 MG tablet, Take 0.5 tablets (100 mg total) by mouth daily., Disp: 30 tablet, Rfl: 1   budesonide -formoterol  (SYMBICORT ) 160-4.5 MCG/ACT inhaler, Inhale 2 puffs into the lungs 2 (two) times daily., Disp: 1 each, Rfl: 2   buPROPion  (WELLBUTRIN  XL) 300 MG 24 hr tablet, Take 300 mg by mouth every morning., Disp: , Rfl:    Cholecalciferol  (VITAMIN D3) 50 MCG (2000 UT) capsule, Take 1 capsule by mouth every morning., Disp: , Rfl:    colchicine  0.6 MG tablet, Take 1.2 mg by mouth See admin instructions. Take 1.2 mg for flair up and an hour later take 0.6 mg if needed, Disp: , Rfl:    Continuous Glucose Sensor (FREESTYLE LIBRE 3 PLUS SENSOR) MISC, as directed., Disp: , Rfl:    ELIQUIS  5 MG TABS tablet, TAKE 1 TABLET (5 MG TOTAL) BY MOUTH 2 (TWO) TIMES DAILY., Disp: 60 tablet, Rfl: 6   insulin  lispro (HUMALOG ) 100 UNIT/ML KwikPen, Inject 15 Units into the skin 3 (three) times daily with meals. Plus sliding scale, max dose 70 units /day, Disp: 25 mL, Rfl: 1   isosorbide -hydrALAZINE  (BIDIL ) 20-37.5 MG tablet, Take 1  tablet by mouth 3 (three) times daily., Disp: 270 tablet, Rfl: 3   LANTUS  SOLOSTAR 100 UNIT/ML Solostar Pen, Inject 30 Units into the skin daily., Disp: 15 mL, Rfl: 0   levothyroxine  (SYNTHROID ) 50 MCG tablet, TAKE 1 TABLET (50 MCG TOTAL) BY MOUTH DAILY BEFORE BREAKFAST (AM), Disp: 30 tablet, Rfl: 0   metoprolol  succinate (TOPROL -XL) 25 MG 24 hr tablet, Take 1 tablet (25 mg total) by mouth daily., Disp: 90 tablet, Rfl: 3   Omega-3 Fatty Acids (FISH OIL PO), Take 1 tablet by mouth daily., Disp: , Rfl:    pantoprazole  (PROTONIX ) 40 MG tablet, TAKE 1 TABLET (40 MG TOTAL) BY MOUTH DAILY(AM), Disp: 30 tablet, Rfl: 0   potassium chloride  SA (KLOR-CON  M) 20 MEQ tablet, Take 1 tablet (20 mEq total) by mouth 2 (two) times daily., Disp: , Rfl:    torsemide  (DEMADEX ) 20 MG tablet, Take 2 tablets (  40 mg total) by mouth 2 (two) times daily., Disp: , Rfl:    VITAMIN E PO, Take 1 tablet by mouth daily., Disp: , Rfl:    Continuous Glucose Receiver (DEXCOM G7 RECEIVER) DEVI, 1 Device by Does not apply route continuous., Disp: 1 each, Rfl: 0   Continuous Glucose Sensor (DEXCOM G7 SENSOR) MISC, 1 Device by Does not apply route continuous., Disp: 9 each, Rfl: 3   docusate sodium  (COLACE) 100 MG capsule, Take 2 capsules (200 mg total) by mouth at bedtime., Disp: 10 capsule, Rfl: 0   erythromycin ophthalmic ointment, Place 1 Application into the right eye 2 (two) times daily., Disp: , Rfl:    fluticasone  (FLONASE) 50 MCG/ACT nasal spray, Place 1 spray into both nostrils as needed for allergies or rhinitis., Disp: , Rfl:    folic acid  (FOLVITE ) 1 MG tablet, Take 1 tablet (1 mg total) by mouth daily., Disp: 30 tablet, Rfl: 0   gabapentin  (NEURONTIN ) 100 MG capsule, Take 1 capsule (100 mg total) by mouth 3 (three) times daily., Disp: 270 capsule, Rfl: 1   Glucagon  (BAQSIMI  ONE PACK) 3 MG/DOSE POWD, Place 1 Device into the nose as needed (Low blood sugar with impaired consciousness)., Disp: 2 each, Rfl: 3   Menthol -Camphor  (ICY HOT PRO NO MESS EX), Apply 1 Application topically 3 (three) times daily as needed (arms, neuropathy lower extrimity, gout in hand.)., Disp: , Rfl:    metolazone  (ZAROXOLYN ) 2.5 MG tablet, Take 1 tablet (2.5 mg total) by mouth daily. X 2 doses, 3rd dose if needed for continued fluid retention, Disp: 3 tablet, Rfl: 0   NYAMYC  powder, Apply 1 Application topically 2 (two) times daily., Disp: , Rfl:    nystatin  cream (MYCOSTATIN ), Apply 1 Application topically 2 (two) times daily as needed for dry skin., Disp: , Rfl:    polyethylene glycol (MIRALAX  / GLYCOLAX ) 17 g packet, Take 17 g by mouth daily as needed for moderate constipation., Disp: 14 each, Rfl: 0   rosuvastatin  (CRESTOR ) 40 MG tablet, Take 1 tablet (40 mg total) by mouth daily. (Patient taking differently: Take 40 mg by mouth at bedtime.), Disp: 90 tablet, Rfl: 3   tirzepatide  (MOUNJARO ) 7.5 MG/0.5ML Pen, Inject 7.5 mg into the skin once a week., Disp: 6 mL, Rfl: 1   triamcinolone  ointment (KENALOG ) 0.1 %, Apply topically 2 (two) times daily., Disp: 454 g, Rfl: 0 Allergies  Allergen Reactions   Neurontin  [Gabapentin ] Nausea And Vomiting and Other (See Comments)    POSSIBLE SHAKING   Lyrica  [Pregabalin ] Other (See Comments)    Shaking    Other     Neurontin        Social History   Socioeconomic History   Marital status: Single    Spouse name: Not on file   Number of children: 1   Years of education: 12   Highest education level: 12th grade  Occupational History   Occupation: disabled  Tobacco Use   Smoking status: Every Day    Current packs/day: 1.00    Average packs/day: 1 pack/day for 44.0 years (44.0 ttl pk-yrs)    Types: E-cigarettes, Cigarettes   Smokeless tobacco: Former    Types: Snuff  Vaping Use   Vaping status: Former   Devices: 11/26/2018 stopped months ago  Substance and Sexual Activity   Alcohol use: Not Currently    Comment: occ   Drug use: Yes    Types: Crack cocaine, Marijuana, Oxycodone     Sexual activity: Not Currently  Other Topics Concern  Not on file  Social History Narrative   ** Merged History Encounter **       Lives in [place], in motel with sister.  They are looking to move but don't have a place to go yet.     Social Drivers of Health   Financial Resource Strain: Low Risk  (02/09/2023)   Overall Financial Resource Strain (CARDIA)    Difficulty of Paying Living Expenses: Not hard at all  Food Insecurity: No Food Insecurity (03/09/2024)   Hunger Vital Sign    Worried About Running Out of Food in the Last Year: Never true    Ran Out of Food in the Last Year: Never true  Transportation Needs: Unmet Transportation Needs (03/09/2024)   PRAPARE - Transportation    Lack of Transportation (Medical): Yes    Lack of Transportation (Non-Medical): Yes  Physical Activity: Inactive (02/09/2023)   Exercise Vital Sign    Days of Exercise per Week: 0 days    Minutes of Exercise per Session: 0 min  Stress: No Stress Concern Present (02/09/2023)   [employer] of Occupational Health - Occupational Stress Questionnaire    Feeling of Stress : Not at all  Social Connections: Unknown (08/21/2023)   Received from [facility]   Social Network    Social Network: Not on file  Intimate Partner Violence: Not At Risk (03/09/2024)   Humiliation, Afraid, Rape, and Kick questionnaire    Fear of Current or Ex-Partner: No    Emotionally Abused: No    Physically Abused: No    Sexually Abused: No    Physical Exam      Future Appointments  Date Time Provider Department Center  08/19/2024 11:40 AM Rolan Ezra RAMAN, MD [facility] None  08/21/2024 11:00 AM [facility] None  08/28/2024 11:00 AM [facility] None  09/04/2024 11:00 AM [facility] None  09/11/2024 11:00 AM [facility] None  10/31/2024 10:30 AM Aniceto Daphne CROME, NP [facility]       [name], Paramedic [phone] [facility] Paramedic  08/12/24

## 2024-08-14 ENCOUNTER — Other Ambulatory Visit (HOSPITAL_COMMUNITY): Payer: Self-pay

## 2024-08-14 NOTE — Progress Notes (Signed)
 Paramedicine Encounter    Patient ID: [name], female    DOB: April 26, 1961, 63 y.o.   MRN: [id number]  Came out for med rec to place wellbutrin , metoprolol  and pantoprazole  where needed. The pharmacy delivered it yesterday.     Patient Care Team: Campbell Reynolds, NP as PCP - General Court Dorn PARAS, MD as PCP - Cardiology (Cardiology) Rolan Ezra GORMAN, MD as PCP - Advanced Heart Failure (Cardiology) Inocencio Soyla Lunger, MD as PCP - Electrophysiology (Cardiology) Court Dorn PARAS, MD as Consulting Physician (Cardiology) Darlean Ozell NOVAK, MD as Consulting Physician (Pulmonary Disease) System, Provider Not In  Patient Active Problem List   Diagnosis Date Noted  . Anemia of chronic renal failure 08/06/2024  . Persistent atrial fibrillation ([name]) 03/15/2024  . Benign neoplasm of ascending colon 03/14/2024  . Benign neoplasm of transverse colon 03/14/2024  . Benign neoplasm of descending colon 03/14/2024  . Benign neoplasm of sigmoid colon 03/14/2024  . Chronic systolic congestive heart failure ([name]) 03/12/2024  . Melena 03/12/2024  . Heme positive stool 03/11/2024  . GI bleed 03/08/2024  . History of CAD (coronary artery disease) 03/08/2024  . CKD (chronic kidney disease), stage IV ([name]) 03/08/2024  . Continuous dependence on cigarette smoking 03/08/2024  . GAD (generalized anxiety disorder) 03/08/2024  . HFrEF (heart failure with reduced ejection fraction) ([name]) 10/11/2023  . 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) and diastolic (congestive) heart failure ([name]) 10/10/2023  . Pre-ulcerative calluses 04/25/2022  . PAF (paroxysmal atrial fibrillation) ([name]) 05/05/2021  . COPD (chronic obstructive pulmonary disease) ([name]) 05/05/2021  . OSA (obstructive sleep apnea) 05/05/2021  . Stage 3b chronic kidney disease ([name]) 05/05/2021  . Pressure injury of skin 05/04/2021  . Diabetic nephropathy ([name]) 01/02/2021  . Low back pain 06/01/2020  . Hyperlipidemia 04/03/2020  .  Peripheral artery disease 12/26/2019  . NICM (nonischemic cardiomyopathy) ([name]) 06/20/2019  . Non-healing ulcer ([name]) 06/20/2019  . Coagulation disorder 08/09/2017  . Depression 07/21/2017  . At risk for adverse drug reaction 06/20/2017  . Peripheral neuropathy 06/20/2017  . S/P transmetatarsal amputation of foot, right ([name]) 06/05/2017  . 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 Obesity, class 2 02/24/2016  . Anticoagulation management encounter 02/10/2016  . Chronic sinus bradycardia 01/12/2016  . Essential hypertension 12/22/2015  . Demand ischemia ([name])   . Acute on chronic diastolic CHF (congestive heart failure) ([name])   . Symptomatic anemia 11/08/2015  . Hypokalemia 11/08/2015  . Tobacco abuse 10/23/2015  . Coronary artery disease   . DOE (dyspnea on exertion) 04/29/2015  . Paroxysmal atrial fibrillation ([name]) 01/16/2015  . Carotid artery stenosis 01/16/2015  . Insomnia 02/03/2014  . S/P peripheral artery angioplasty - TurboHawk atherectomy; R SFA 09/11/2013    Class: Acute  . Leg pain, bilateral 08/19/2013  . Hypothyroidism 07/31/2013  . History of cocaine abuse ([name]) 06/13/2013  . Long term current use of anticoagulant therapy 05/20/2013  . Alcohol abuse   . Narcotic abuse ([name])   . Marijuana abuse   . Alcoholic cirrhosis ([name])   . Insulin  dependent type 2 diabetes mellitus ([name])     Current Outpatient Medications:  .  acetaminophen  (TYLENOL ) 500 MG tablet, Take 1,000 mg by mouth every 6 (six) hours as needed for headache (pain)., Disp: , Rfl:  .  albuterol  (VENTOLIN  HFA) 108 (90 Base) MCG/ACT inhaler, USE 2 PUFFS BY MOUTH EVERY FOUR HOURS, AS NEEDED, FOR COUGHING/WHEEZING, Disp: 8.5 g, Rfl: 0 .  allopurinol  (  ZYLOPRIM ) 100 MG tablet, Take 150 mg by mouth daily., Disp: , Rfl:  .  amiodarone  (PACERONE ) 200 MG tablet, Take 0.5 tablets (100 mg total) by mouth daily., Disp: 30 tablet, Rfl: 1 .   budesonide -formoterol  (SYMBICORT ) 160-4.5 MCG/ACT inhaler, Inhale 2 puffs into the lungs 2 (two) times daily., Disp: 1 each, Rfl: 2 .  buPROPion  (WELLBUTRIN  XL) 300 MG 24 hr tablet, Take 300 mg by mouth every morning., Disp: , Rfl:  .  Cholecalciferol  (VITAMIN D3) 50 MCG (2000 UT) capsule, Take 1 capsule by mouth every morning., Disp: , Rfl:  .  colchicine  0.6 MG tablet, Take 1.2 mg by mouth See admin instructions. Take 1.2 mg for flair up and an hour later take 0.6 mg if needed, Disp: , Rfl:  .  Continuous Glucose Receiver (DEXCOM G7 RECEIVER) DEVI, 1 Device by Does not apply route continuous., Disp: 1 each, Rfl: 0 .  Continuous Glucose Sensor (DEXCOM G7 SENSOR) MISC, 1 Device by Does not apply route continuous., Disp: 9 each, Rfl: 3 .  Continuous Glucose Sensor (FREESTYLE LIBRE 3 PLUS SENSOR) MISC, as directed., Disp: , Rfl:  .  docusate sodium  (COLACE) 100 MG capsule, Take 2 capsules (200 mg total) by mouth at bedtime., Disp: 10 capsule, Rfl: 0 .  ELIQUIS  5 MG TABS tablet, TAKE 1 TABLET (5 MG TOTAL) BY MOUTH 2 (TWO) TIMES DAILY., Disp: 60 tablet, Rfl: 6 .  erythromycin ophthalmic ointment, Place 1 Application into the right eye 2 (two) times daily., Disp: , Rfl:  .  fluticasone  (FLONASE) 50 MCG/ACT nasal spray, Place 1 spray into both nostrils as needed for allergies or rhinitis., Disp: , Rfl:  .  folic acid  (FOLVITE ) 1 MG tablet, Take 1 tablet (1 mg total) by mouth daily., Disp: 30 tablet, Rfl: 0 .  gabapentin  (NEURONTIN ) 100 MG capsule, Take 1 capsule (100 mg total) by mouth 3 (three) times daily., Disp: 270 capsule, Rfl: 1 .  Glucagon  (BAQSIMI  ONE PACK) 3 MG/DOSE POWD, Place 1 Device into the nose as needed (Low blood sugar with impaired consciousness)., Disp: 2 each, Rfl: 3 .  insulin  lispro (HUMALOG ) 100 UNIT/ML KwikPen, Inject 15 Units into the skin 3 (three) times daily with meals. Plus sliding scale, max dose 70 units /day, Disp: 25 mL, Rfl: 1 .  isosorbide -hydrALAZINE  (BIDIL ) 20-37.5 MG  tablet, Take 1 tablet by mouth 3 (three) times daily., Disp: 270 tablet, Rfl: 3 .  LANTUS  SOLOSTAR 100 UNIT/ML Solostar Pen, Inject 30 Units into the skin daily., Disp: 15 mL, Rfl: 0 .  levothyroxine  (SYNTHROID ) 50 MCG tablet, TAKE 1 TABLET (50 MCG TOTAL) BY MOUTH DAILY BEFORE BREAKFAST (AM), Disp: 30 tablet, Rfl: 0 .  Menthol -Camphor (ICY HOT PRO NO MESS EX), Apply 1 Application topically 3 (three) times daily as needed (arms, neuropathy lower extrimity, gout in hand.)., Disp: , Rfl:  .  metolazone  (ZAROXOLYN ) 2.5 MG tablet, Take 1 tablet (2.5 mg total) by mouth daily. X 2 doses, 3rd dose if needed for continued fluid retention, Disp: 3 tablet, Rfl: 0 .  metoprolol  succinate (TOPROL -XL) 25 MG 24 hr tablet, Take 1 tablet (25 mg total) by mouth daily., Disp: 90 tablet, Rfl: 3 .  NYAMYC  powder, Apply 1 Application topically 2 (two) times daily., Disp: , Rfl:  .  nystatin  cream (MYCOSTATIN ), Apply 1 Application topically 2 (two) times daily as needed for dry skin., Disp: , Rfl:  .  Omega-3 Fatty Acids (FISH OIL PO), Take 1 tablet by mouth daily., Disp: , Rfl:  .  pantoprazole  (PROTONIX ) 40 MG tablet, TAKE 1 TABLET (40 MG TOTAL) BY MOUTH DAILY(AM), Disp: 30 tablet, Rfl: 0 .  polyethylene glycol (MIRALAX  / GLYCOLAX ) 17 g packet, Take 17 g by mouth daily as needed for moderate constipation., Disp: 14 each, Rfl: 0 .  potassium chloride  SA (KLOR-CON  M) 20 MEQ tablet, Take 1 tablet (20 mEq total) by mouth 2 (two) times daily., Disp: , Rfl:  .  rosuvastatin  (CRESTOR ) 40 MG tablet, Take 1 tablet (40 mg total) by mouth daily. (Patient taking differently: Take 40 mg by mouth at bedtime.), Disp: 90 tablet, Rfl: 3 .  tirzepatide  (MOUNJARO ) 7.5 MG/0.5ML Pen, Inject 7.5 mg into the skin once a week., Disp: 6 mL, Rfl: 1 .  torsemide  (DEMADEX ) 20 MG tablet, Take 2 tablets (40 mg total) by mouth 2 (two) times daily., Disp: , Rfl:  .  triamcinolone  ointment (KENALOG ) 0.1 %, Apply topically 2 (two) times daily., Disp: 454  g, Rfl: 0 .  VITAMIN E PO, Take 1 tablet by mouth daily., Disp: , Rfl:  Allergies  Allergen Reactions  . Neurontin  [Gabapentin ] Nausea And Vomiting and Other (See Comments)    POSSIBLE SHAKING  . Lyrica  [Pregabalin ] Other (See Comments)    Shaking   . Other     Neurontin        Social History   Socioeconomic History  . Marital status: Single    Spouse name: Not on file  . Number of children: 1  . Years of education: 43  . Highest education level: 12th grade  Occupational History  . Occupation: disabled  Tobacco Use  . Smoking status: Every Day    Current packs/day: 1.00    Average packs/day: 1 pack/day for 44.0 years (44.0 ttl pk-yrs)    Types: E-cigarettes, Cigarettes  . Smokeless tobacco: Former    Types: Snuff  Vaping Use  . Vaping status: Former  . Devices: 11/26/2018 stopped months ago  Substance and Sexual Activity  . Alcohol use: Not Currently    Comment: occ  . Drug use: Yes    Types: Crack cocaine, Marijuana, Oxycodone   . Sexual activity: Not Currently  Other Topics Concern  . Not on file  Social History Narrative   ** Merged History Encounter **       Lives in [place], in motel with sister.  They are looking to move but don't have a place to go yet.     Social Drivers of Health   Financial Resource Strain: Low Risk  (02/09/2023)   Overall Financial Resource Strain (CARDIA)   . Difficulty of Paying Living Expenses: Not hard at all  Food Insecurity: No Food Insecurity (03/09/2024)   Hunger Vital Sign   . Worried About Programme researcher, broadcasting/film/video in the Last Year: Never true   . Ran Out of Food in the Last Year: Never true  Transportation Needs: Unmet Transportation Needs (03/09/2024)   PRAPARE - Transportation   . Lack of Transportation (Medical): Yes   . Lack of Transportation (Non-Medical): Yes  Physical Activity: Inactive (02/09/2023)   Exercise Vital Sign   . Days of Exercise per Week: 0 days   . Minutes of Exercise per Session: 0 min  Stress: No Stress  Concern Present (02/09/2023)   [employer] of Occupational Health - Occupational Stress Questionnaire   . Feeling of Stress : Not at all  Social Connections: Unknown (08/21/2023)   Received from [facility]   Social Network   . Social Network: Not on file  Intimate  Partner Violence: Not At Risk (03/09/2024)   Humiliation, Afraid, Rape, and Kick questionnaire   . Fear of Current or Ex-Partner: No   . Emotionally Abused: No   . Physically Abused: No   . Sexually Abused: No    Physical Exam      Future Appointments  Date Time Provider Department Center  08/19/2024 11:40 AM Rolan Ezra RAMAN, MD [facility] None  08/21/2024 11:00 AM [facility] None  08/28/2024 11:00 AM [facility] None  09/04/2024 11:00 AM [facility] None  09/11/2024 11:00 AM [facility] None  10/31/2024 10:30 AM Aniceto Daphne CROME, NP [facility]V       [name], Paramedic [phone] [facility] Paramedic  08/14/24

## 2024-08-16 ENCOUNTER — Telehealth (HOSPITAL_COMMUNITY): Payer: Self-pay | Admitting: Cardiology

## 2024-08-16 NOTE — Telephone Encounter (Signed)
 Called to confirm/remind patient of their appointment at [facility] on 08/16/2024.   Appointment:   [x] Confirmed  [] Left mess   [] No answer/No voice mail  [] VM Full/unable to leave message  [] Phone not in service  Patient reminded to bring all medications and/or complete list.  Confirmed patient has transportation. Gave directions, instructed to utilize valet parking.

## 2024-08-19 ENCOUNTER — Other Ambulatory Visit (HOSPITAL_COMMUNITY): Payer: Self-pay

## 2024-08-19 ENCOUNTER — Encounter (HOSPITAL_COMMUNITY): Payer: Self-pay | Admitting: Cardiology

## 2024-08-19 ENCOUNTER — Ambulatory Visit (HOSPITAL_COMMUNITY)
Admission: RE | Admit: 2024-08-19 | Discharge: 2024-08-19 | Disposition: A | Discharge status: Home / Self Care | Source: Ambulatory Visit | Attending: Cardiology | Admitting: Cardiology

## 2024-08-19 ENCOUNTER — Ambulatory Visit (HOSPITAL_COMMUNITY): Payer: Self-pay | Admitting: Cardiology

## 2024-08-19 VITALS — BP 120/64 | HR 62 | Wt 227.0 lb

## 2024-08-19 DIAGNOSIS — I130 Hypertensive heart and chronic kidney disease with heart failure and stage 1 through stage 4 chronic kidney disease, or unspecified chronic kidney disease: Secondary | ICD-10-CM | POA: Diagnosis not present

## 2024-08-19 DIAGNOSIS — Z79899 Other long term (current) drug therapy: Secondary | ICD-10-CM | POA: Insufficient documentation

## 2024-08-19 DIAGNOSIS — E669 Obesity, unspecified: Secondary | ICD-10-CM | POA: Diagnosis not present

## 2024-08-19 DIAGNOSIS — J449 Chronic obstructive pulmonary disease, unspecified: Secondary | ICD-10-CM | POA: Diagnosis not present

## 2024-08-19 DIAGNOSIS — N184 Chronic kidney disease, stage 4 (severe): Secondary | ICD-10-CM | POA: Insufficient documentation

## 2024-08-19 DIAGNOSIS — Z89431 Acquired absence of right foot: Secondary | ICD-10-CM | POA: Insufficient documentation

## 2024-08-19 DIAGNOSIS — E1122 Type 2 diabetes mellitus with diabetic chronic kidney disease: Secondary | ICD-10-CM | POA: Diagnosis not present

## 2024-08-19 DIAGNOSIS — F17210 Nicotine dependence, cigarettes, uncomplicated: Secondary | ICD-10-CM | POA: Insufficient documentation

## 2024-08-19 DIAGNOSIS — Z89422 Acquired absence of other left toe(s): Secondary | ICD-10-CM | POA: Diagnosis not present

## 2024-08-19 DIAGNOSIS — I2510 Atherosclerotic heart disease of native coronary artery without angina pectoris: Secondary | ICD-10-CM | POA: Diagnosis not present

## 2024-08-19 DIAGNOSIS — I739 Peripheral vascular disease, unspecified: Secondary | ICD-10-CM | POA: Diagnosis not present

## 2024-08-19 DIAGNOSIS — I5022 Chronic systolic (congestive) heart failure: Secondary | ICD-10-CM | POA: Diagnosis present

## 2024-08-19 DIAGNOSIS — Z6841 Body Mass Index (BMI) 40.0 and over, adult: Secondary | ICD-10-CM | POA: Insufficient documentation

## 2024-08-19 DIAGNOSIS — I5020 Unspecified systolic (congestive) heart failure: Secondary | ICD-10-CM | POA: Diagnosis not present

## 2024-08-19 DIAGNOSIS — Z95828 Presence of other vascular implants and grafts: Secondary | ICD-10-CM | POA: Insufficient documentation

## 2024-08-19 DIAGNOSIS — Z7985 Long-term (current) use of injectable non-insulin antidiabetic drugs: Secondary | ICD-10-CM | POA: Diagnosis not present

## 2024-08-19 DIAGNOSIS — M545 Low back pain, unspecified: Secondary | ICD-10-CM | POA: Diagnosis not present

## 2024-08-19 DIAGNOSIS — Z7901 Long term (current) use of anticoagulants: Secondary | ICD-10-CM | POA: Diagnosis not present

## 2024-08-19 DIAGNOSIS — I6522 Occlusion and stenosis of left carotid artery: Secondary | ICD-10-CM | POA: Insufficient documentation

## 2024-08-19 DIAGNOSIS — I480 Paroxysmal atrial fibrillation: Secondary | ICD-10-CM | POA: Insufficient documentation

## 2024-08-19 DIAGNOSIS — Z794 Long term (current) use of insulin: Secondary | ICD-10-CM | POA: Insufficient documentation

## 2024-08-19 LAB — CBC
HCT: 26 % — ABNORMAL LOW (ref 36.0–46.0)
Hemoglobin: 7.7 g/dL — ABNORMAL LOW (ref 12.0–15.0)
MCH: 27.5 pg (ref 26.0–34.0)
MCHC: 29.6 g/dL — ABNORMAL LOW (ref 30.0–36.0)
MCV: 92.9 fL (ref 80.0–100.0)
Platelets: 244 K/uL (ref 150–400)
RBC: 2.8 MIL/uL — ABNORMAL LOW (ref 3.87–5.11)
RDW: 16.7 % — ABNORMAL HIGH (ref 11.5–15.5)
WBC: 7.4 K/uL (ref 4.0–10.5)
nRBC: 0 % (ref 0.0–0.2)

## 2024-08-19 LAB — COMPREHENSIVE METABOLIC PANEL WITH GFR
ALT: 14 U/L (ref 0–44)
AST: 16 U/L (ref 15–41)
Albumin: 3 g/dL — ABNORMAL LOW (ref 3.5–5.0)
Alkaline Phosphatase: 76 U/L (ref 38–126)
Anion gap: 10 (ref 5–15)
BUN: 55 mg/dL — ABNORMAL HIGH (ref 8–23)
CO2: 25 mmol/L (ref 22–32)
Calcium: 8.9 mg/dL (ref 8.9–10.3)
Chloride: 104 mmol/L (ref 98–111)
Creatinine, Ser: 2.04 mg/dL — ABNORMAL HIGH (ref 0.44–1.00)
GFR, Estimated: 27 mL/min — ABNORMAL LOW (ref >60)
Glucose, Bld: 121 mg/dL — ABNORMAL HIGH (ref 70–99)
Potassium: 4.5 mmol/L (ref 3.5–5.1)
Sodium: 139 mmol/L (ref 135–145)
Total Bilirubin: 0.4 mg/dL (ref 0.0–1.2)
Total Protein: 6.1 g/dL — ABNORMAL LOW (ref 6.5–8.1)

## 2024-08-19 LAB — TSH: TSH: 3.004 u[IU]/mL (ref 0.350–4.500)

## 2024-08-19 LAB — BRAIN NATRIURETIC PEPTIDE: B Natriuretic Peptide: 418.1 pg/mL — ABNORMAL HIGH (ref 0.0–100.0)

## 2024-08-19 MED ORDER — METOPROLOL SUCCINATE ER 50 MG PO TB24: 50.0000 mg | ORAL_TABLET | Freq: Every day | ORAL | 3 refills | Status: DC

## 2024-08-19 MED ORDER — TORSEMIDE 20 MG PO TABS: 60.0000 mg | ORAL_TABLET | Freq: Two times a day (BID) | ORAL | 3 refills | Status: DC

## 2024-08-19 MED ORDER — POTASSIUM CHLORIDE CRYS ER 20 MEQ PO TBCR: EXTENDED_RELEASE_TABLET | ORAL | 3 refills | Status: DC

## 2024-08-19 NOTE — Patient Instructions (Signed)
 INCREASE Torsemide  to 60 mg Twice daily  CHANGE Potassium to 40 mEq ( 2 Tab) in the morning and 20 mEq ( 1 tab ) in the evening.  INCREASE Toprol  XL to 50 mg daily.  Labs done today, your results will be available in MyChart, we will contact you for abnormal readings.  REPEAT blood work in 10 days.  Your physician recommends that you schedule a follow-up appointment in: 3 weeks.  If you have any questions or concerns before your next appointment please send us  a message through [place] or call our office at [phone].    TO LEAVE A MESSAGE FOR THE NURSE SELECT OPTION 2, PLEASE LEAVE A MESSAGE INCLUDING: YOUR NAME DATE OF BIRTH CALL BACK NUMBER REASON FOR CALL**this is important as we prioritize the call backs  YOU WILL RECEIVE A CALL BACK THE SAME DAY AS LONG AS YOU CALL BEFORE 4:00 PM  At the [facility], you and your health needs are our priority. As part of our continuing mission to provide you with exceptional heart care, we have created designated Provider Care Teams. These Care Teams include your primary Cardiologist (physician) and Advanced Practice Providers (APPs- Physician Assistants and Nurse Practitioners) who all work together to provide you with the care you need, when you need it.   You may see any of the following providers on your designated Care Team at your next follow up: Dr Toribio Fuel Dr Ezra Shuck Dr. Ria Commander Dr. Morene Brownie [name] Lenetta, NP Caffie Shed, [place] [facility], [place] Beckey Coe, NP [place] Lee, NP [name] Class, NP Tinnie Redman, PharmD Jaun Bash, PharmD   Please be sure to bring in all your medications bottles to every appointment.    Thank you for choosing [facility]

## 2024-08-19 NOTE — Progress Notes (Signed)
 Paramedicine Encounter   Patient ID: [name] , female,   DOB: 07-02-1961,62 y.o.,  MRN: [id number]   Met patient in clinic today with provider.  Weight @ clinic-227 B/P-120/64 P-62 [id number]  Med changes- Increase metoprolol  to 50mg  daily  Increase torsemide  to 60mg  BID Increase potassium to 40meq in am and 20meq in pm   Pt c/o pain all over.  She said it was her arthritis/sciatica nerve pain.  Weight is back up  Lower torsemide  is not working.  Fluid is up.  Labs done today   Meds verified and pill box refilled.  Will f/u if any changes are needed from labs, if not, will see her next week.   [name], EMT-Paramedic [phone] 08/19/2024

## 2024-08-20 NOTE — Progress Notes (Signed)
 Patient ID: [name], female   DOB: 16-Dec-1960, 63 y.o.   MRN: [id number]    [facility] Note   PCP: [facility] PV: Dr. Court HF Cardiology: Dr. Rolan  Chief complaint: CHF  HPI: [name] is a 63 y.o. with history of PAD, carotid stenosis s/p left CEA, CAD, chronic systolic CHF, paroxysmal atrial fibrillation, bradycardia, and prior substance abuse.   In 5/18, she had peripheral angiography with atherectomy of right SFA.  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 In 8/20, she had peripheral angiography showing totally occluded left SFA.  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 Oris.    Echo 1/21 EF 50%, mild LVH, normal RV, PASP 60 mmHg.  Echo 4/22 EF 55-60% with basal to mid inferolateral hypokinesis.   Underwent PV angiogram with Dr. Court 7/22. She had drug-coated balloon angioplasty of the right SFA after being admitted for critical ischemia of the right foot.  Repeat peripheral arterial dopplers in 8/22 still showed severe right SFA disease.   Echo 4/24 EF 50-55%, RV normal.  Admitted 9/24 with UTI/sepsis. Required ICU care, BiPap and abx. Underwent RLE angiogram with femoropopliteal angioplasty and stenting.  Two admissions in 11/24 for AF/RVR s/p DCCV. EF was 45-50%, nl RV. She was d/c'd on Torsemide  100 bid w/o weekly metolazone  and readmitted for volume overload. She was d/c'd with prn Metolazone , however CardioMems reading (PA 31) suggested she was not diuresed at discharge. Was instructed to use Furosicx at home. At follow up PA 36, she was switched back to Torsemide  with metolazone .  Admitted 4/25 for GIB and ABLA. Hgb 5.3 on admission. Transfused. Eliquis  held. Underwent EGD: Esophageal plaques were found consistent with candidiasis.  Normal stomach. Started on fluconazole .   Normal examined duodenal. No source of bleeding on endoscopy, underwent colonoscopy,  multiples polyps found and had polypectomy. GI recommended capsule endoscopy in the future if recurrence of bleeding. Resumed Eliquis .   Echo in 4/25 showed EF 35-40%, mild RV dysfunction, PASP 56, moderate MR.   Today she returns for HF follow up with paramedic, [name]. Weight is up 6 lbs. Still smoking 1/2 ppd. No chest pain.  She has knee pain and low back pain, plans to get a knee injection soon.  Uses walker for balance in the house.  Pins and needles pain in feet bilaterally. No lightheadedness. She is most limited by knee and back pain, does not do a lot of walking.  Not short of breath walking to bathroom.  No orthopnea/PND.    Cardiomems goal 20, today's reading 27 (personally reviewed)  ECG (personally reviewed): Possible ectopic atrial rhythm with inferolateral TWIs.   Labs (1/24): K 4.0, creatinine 3.58 Labs (9/24): K 3.8, creatinine 1.44 Labs (11/24): K 4, creatinine 1.59 Labs (11/24): 4.2, creatinine 1.28  Labs (5/25): K 3.7, creatinine 1.78 Labs (6/25): K 5.0, creatinine 2.45, normal TSH, normal LFTs Labs (9/25): K 4, creatinine 2.7   PMH: 1. Carotid stenosis: Known occluded right carotid.  Left CEA in 4/16.  - Carotid dopplers (7/21): CTO RICA, mild disease LICA.  - Carotid dopplers (7/22): CTO RICA, 1-39% LICA.  - Carotid dopplers (3/23): Totally occluded RICA, 1-39% LICA.  2. CAD: LHC in 12/15 with 80% stenosis in small OM1, nonobstructive disease in other territories.  3. Chronic systolic CHF: Nonischemic cardiomyopathy (?due to ETOH or prior drug abuse).  She has a Cardiomems.  -  Echo (1/17) with EF 45%, mild LV hypertrophy, moderate diastolic dysfunction, inferolateral severe hypokinesis, mildly decreased RV systolic function.  - Echo (1/18): EF 40-45%, mild LVH, normal RV size and systolic function.  - Echo (1/21): EF 50%, mild LVH, normal RV, PASP 60 mmHg.  - Echo (4/22): EF 55-60%, basal-mid inferolateral hypokinesis with grade II diastolic dysfunction, RV normal,  PASP 42 - Echo (4/24): EF 50-55%.  - Echo (11/24): EF 45-50%, normal RV, mild MR, anterolateral akinesis. - Echo (4/25): EF 35-40%, mild RV dysfunction, PASP 56, moderate MR.  4. Atrial fibrillation: Paroxysmal.   - DCCV 11/24 5. Type II diabetes 6. CKD: Stage III-IV. 7. COPD: Smokes 1/4-1/2 ppd.  8. 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  No longer drinks.  9. Hypothyroidism 10. PAD: Atherectomy SFA in 2014 (Dr Court).  Peripheral arterial dopplers (2/17) with focal 75-99% proximal right SFA stenosis, occluded mid-distal right SFA, chronic occlusion of all runoff arteries on right. - In 5/18, she had peripheral angiography with atherectomy of right SFA.   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  - 7/18 ABIs showed 0.65 on right, 0.31 on left.   - Peripheral angiography 8/20: Totally occluded left SFA.  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 Oris.   - ABIs (2/21): 0.44 R, 0.99 L.  - Balloon angioplasty right SFA 7/22.  - ABIs (8/22): severe right SFA disease.  - peripheral arterial dopplers (2/23): 50-74% right SFA and right popliteal. Stable.  - s/p RLE angiogram with femoropopliteal angioplasty and stenting 9/24 - ABIs (11/24): 0.6 on right, 1.03 on left.  11. Anemia: Fe deficiency.  12. Prior cocaine abuse.  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  14. OSA: Has not been able to tolerate CPAP.  15. Diabetic neuropathy.    SH: Lives with sister.  Prior cocaine abuse.  Prior ETOH abuse.  Quit smoking in 1/17, restarted, and quit again in 4/19, restarted again.   FH: CAD  Review of systems complete and found to be negative unless listed in HPI.    Current Outpatient Medications  Medication Sig Dispense Refill   acetaminophen  (TYLENOL ) 500 MG tablet Take 1,000 mg by mouth every 6 (six) hours as needed for headache (pain).     albuterol  (VENTOLIN  HFA) 108 (90 Base)  MCG/ACT inhaler USE 2 PUFFS BY MOUTH EVERY FOUR HOURS, AS NEEDED, FOR COUGHING/WHEEZING 8.5 g 0   allopurinol  (ZYLOPRIM ) 100 MG tablet Take 150 mg by mouth daily.     amiodarone  (PACERONE ) 200 MG tablet Take 0.5 tablets (100 mg total) by mouth daily. 30 tablet 1   budesonide -formoterol  (SYMBICORT ) 160-4.5 MCG/ACT inhaler Inhale 2 puffs into the lungs 2 (two) times daily. 1 each 2   buPROPion  (WELLBUTRIN  XL) 300 MG 24 hr tablet Take 300 mg by mouth every morning.     Cholecalciferol  (VITAMIN D3) 50 MCG (2000 UT) capsule Take 1 capsule by mouth every morning.     colchicine  0.6 MG tablet Take 1.2 mg by mouth See admin instructions. Take 1.2 mg for flair up and an hour later take 0.6 mg if needed     Continuous Glucose Receiver (DEXCOM G7 RECEIVER) DEVI 1 Device by Does not apply route continuous. 1 each 0   Continuous Glucose Sensor (DEXCOM G7 SENSOR) MISC 1 Device by Does not apply route continuous. 9 each 3   Continuous Glucose Sensor (FREESTYLE LIBRE 3 PLUS SENSOR)  MISC as directed.     docusate sodium  (COLACE) 100 MG capsule Take 2 capsules (200 mg total) by mouth at bedtime. 10 capsule 0   ELIQUIS  5 MG TABS tablet TAKE 1 TABLET (5 MG TOTAL) BY MOUTH 2 (TWO) TIMES DAILY. 60 tablet 6   erythromycin ophthalmic ointment Place 1 Application into the right eye 2 (two) times daily.     fluticasone  (FLONASE) 50 MCG/ACT nasal spray Place 1 spray into both nostrils as needed for allergies or rhinitis.     folic acid  (FOLVITE ) 1 MG tablet Take 1 tablet (1 mg total) by mouth daily. 30 tablet 0   gabapentin  (NEURONTIN ) 100 MG capsule Take 1 capsule (100 mg total) by mouth 3 (three) times daily. 270 capsule 1   Glucagon  (BAQSIMI  ONE PACK) 3 MG/DOSE POWD Place 1 Device into the nose as needed (Low blood sugar with impaired consciousness). 2 each 3   insulin  glargine (LANTUS ) 100 UNIT/ML injection Inject 58 Units into the skin at bedtime.     insulin  lispro (HUMALOG ) 100 UNIT/ML KwikPen Inject 15 Units into the  skin 3 (three) times daily with meals. Plus sliding scale, max dose 70 units /day 25 mL 1   isosorbide -hydrALAZINE  (BIDIL ) 20-37.5 MG tablet Take 1 tablet by mouth 3 (three) times daily. 270 tablet 3   levothyroxine  (SYNTHROID ) 50 MCG tablet TAKE 1 TABLET (50 MCG TOTAL) BY MOUTH DAILY BEFORE BREAKFAST (AM) 30 tablet 0   Menthol -Camphor (ICY HOT PRO NO MESS EX) Apply 1 Application topically 3 (three) times daily as needed (arms, neuropathy lower extrimity, gout in hand.).     metolazone  (ZAROXOLYN ) 2.5 MG tablet Take 2.5 mg by mouth as needed.     NYAMYC  powder Apply 1 Application topically 2 (two) times daily.     nystatin  cream (MYCOSTATIN ) Apply 1 Application topically 2 (two) times daily as needed for dry skin.     Omega-3 Fatty Acids (FISH OIL PO) Take 1 tablet by mouth daily.     pantoprazole  (PROTONIX ) 40 MG tablet TAKE 1 TABLET (40 MG TOTAL) BY MOUTH DAILY(AM) 30 tablet 0   polyethylene glycol (MIRALAX  / GLYCOLAX ) 17 g packet Take 17 g by mouth daily as needed for moderate constipation. 14 each 0   rosuvastatin  (CRESTOR ) 40 MG tablet Take 1 tablet (40 mg total) by mouth daily. 90 tablet 3   tirzepatide  (MOUNJARO ) 7.5 MG/0.5ML Pen Inject 7.5 mg into the skin once a week. 6 mL 1   triamcinolone  ointment (KENALOG ) 0.1 % Apply topically 2 (two) times daily. 454 g 0   VITAMIN E PO Take 1 tablet by mouth daily.     metoprolol  succinate (TOPROL -XL) 50 MG 24 hr tablet Take 1 tablet (50 mg total) by mouth daily. 90 tablet 3   potassium chloride  SA (KLOR-CON  M) 20 MEQ tablet Take 2 tablets (40 mEq total) by mouth every morning AND 1 tablet (20 mEq total) every evening. 200 tablet 3   torsemide  (DEMADEX ) 20 MG tablet Take 3 tablets (60 mg total) by mouth 2 (two) times daily. 200 tablet 3   No current facility-administered medications for this encounter.   BP 120/64   Pulse 62   Wt 103 kg (227 lb)   LMP  (LMP Unknown)   SpO2 98%   BMI 40.21 kg/m   Wt Readings from Last 3 Encounters:  08/19/24  103 kg (227 lb)  08/12/24 98.4 kg (217 lb)  08/05/24 98 kg (216 lb)   PHYSICAL EXAM: General: NAD Neck: JVP  8-9 cm, no thyromegaly or thyroid  nodule.  Lungs: Distant BS CV: Nondisplaced PMI.  Heart regular S1/S2, no S3/S4, 1/6 SEM RUSB.  1+ edema to knees.  No carotid bruit.  Normal pedal pulses.  Abdomen: Soft, nontender, no hepatosplenomegaly, no distention.  Skin: Intact without lesions or rashes.  Neurologic: Alert and oriented x 3.  Psych: Normal affect. Extremities: No clubbing or cyanosis.  HEENT: Normal.   Assessment/Plan: 1. Chronic systolic CHF: LHC in 12/15 showing only 80% stenosis in small OM1. EF as low as 40-45% in the past. Echo 4/22 showed EF 55-60% with grade 2 diastolic dysfunction.  Echo (4/24) EF 50-55%.  Echo in 11/24 with EF 45-50%, normal RV, mild MR, anterolateral akinesis.  Echo in 4/25 showed EF 35-40%, mild RV dysfunction, PASP 56, moderate MR.  NYHA class II but exertion limited by back and knee pain. GDMT limited by cardio-renal syndrome.  She is volume overloaded by exam and Cardiomems and weight is up.  - Increase torsemide  to 60 mg bdi. BMET/BNP today, BMET in 10 days.  - Increase KCl to 40 qam/20 qpm.  - Continue BiDil  1 tab tid - Increase Toprol  XL to 50 mg daily.  - Off Jardiance  due to severe UTIs. 2. CKD IV:  baseline SCr > 2 since 11/2023.  In the setting of cardio-renal disease and poorly controlled T2DM. - Off spironolactone  and ARB with creatinine rise.  - Followed by nephrology.  3. Atrial fibrillation: Paroxysmal. She is in a stable ectopic atrial rhythm today.  - Continue Eliquis  5 mg bid. - Continue amiodarone  100 mg daily. Check LFTs and TSH. She will need regular eye exam while on amiodarone . 4. PAD: Not candidate for cilostazol with CHF.  She has had a left fem-pop bypass in 8/20. She underwent PV angiogram with balloon angioplasty of the R SFA 7/22. 8/22 dopplers still showed severe right SFA disease. Dopplers 2/23 showed patent right SFA.   Peripheral arterial dopplers in 2/23 showed 50-74% stenosis right SFA and popliteal.  S/p RLE fem-pop angioplasty and stenting in 9/24.  - Continue rosuvastatin .  - She is on apixaban  so no ASA.  5. COPD:  Smoking 0.5 pack/day.  - Discussed smoking cessation.    6. HTN: BP controlled.  7.Type 2 diabetes: Poorly controlled historically.  - Now follows with Endocrine. 8. CAD: LHC in 12/15 with 80% stenosis in small OM1, nonobstructive disease in other territories. No chest pain. - Continue rosuvastatin  + Vascepa  - Not on ASA with need for Eliquis . 9. Carotid stenosis: Stable 4/24 dopplers.  Followed by [facility].  10. Obesity: Body mass index is 40.21 kg/m. - On Mounjaro   Follow up in 3 wks with APP.   I spent 32 minutes reviewing records, interviewing/examining patient, and managing orders.   Ezra Shuck, MD 08/20/2024

## 2024-08-21 ENCOUNTER — Encounter (HOSPITAL_COMMUNITY)
Admission: RE | Admit: 2024-08-21 | Discharge: 2024-08-21 | Disposition: A | Discharge status: Home / Self Care | Source: Ambulatory Visit | Attending: Internal Medicine | Admitting: Internal Medicine

## 2024-08-21 VITALS — BP 119/90 | HR 61 | Temp 98.3°F | Resp 17

## 2024-08-21 DIAGNOSIS — D631 Anemia in chronic kidney disease: Secondary | ICD-10-CM | POA: Diagnosis present

## 2024-08-21 DIAGNOSIS — N183 Chronic kidney disease, stage 3 unspecified: Secondary | ICD-10-CM | POA: Diagnosis present

## 2024-08-21 MED ORDER — IRON SUCROSE 300 MG IVPB - SIMPLE MED
300.0000 mg | Freq: Once | Status: AC
  Administered 2024-08-21: 300 mg via INTRAVENOUS
  Filled 2024-08-21: qty 300

## 2024-08-26 ENCOUNTER — Other Ambulatory Visit (HOSPITAL_COMMUNITY): Payer: Self-pay

## 2024-08-26 NOTE — Progress Notes (Addendum)
 Paramedicine Encounter    Patient ID: [name] Sluder, female    DOB: 1961/02/23, 63 y.o.   MRN: [id number]   Complaints-knee pain, tired   Edema-yes more to rt leg-as always  Compliance with meds-yes  Pill box filled-yes  If so, by whom-paramedic   Refills needed-eliquis , symbicort , metoprolol , torsemide   Pt reports she is doing ok other than her knee pain.  Her weight still increased. She partook in cookout this wknd that included chlitlins, ribs, bbq and fixings.  I took a peek at her cardiomems readings and they have been quite elevated.  Her abd looks bloated.     Slight wheeze to RUL-have not used inhaler yet today.   She denies increased sob, no dizziness, no c/p.   She did get refills of meds so I will go get them to fill up pill box, she needs eliquis  to finish out pill box.  I returned with her meds.  Will send message to staff ref weight.    Meds verified and pill box refilled.    CBGs PTA-250  BP 128/70   Pulse 72   Resp 18   Wt 228 lb (103.4 kg)   LMP  (LMP Unknown)   SpO2 97%   BMI 40.39 kg/m  Weight yesterday-? Last visit weight-227 @ clinic   Patient Care Team: Campbell Reynolds, NP as PCP - General Court Dorn PARAS, MD as PCP - Cardiology (Cardiology) Rolan Ezra GORMAN, MD as PCP - Advanced Heart Failure (Cardiology) Inocencio Soyla Lunger, MD as PCP - Electrophysiology (Cardiology) Court Dorn PARAS, MD as Consulting Physician (Cardiology) Darlean Ozell NOVAK, MD as Consulting Physician (Pulmonary Disease) System, Provider Not In  Patient Active Problem List   Diagnosis Date Noted   Anemia of chronic renal failure 08/06/2024   Persistent atrial fibrillation ([name]) 03/15/2024   Benign neoplasm of ascending colon 03/14/2024   Benign neoplasm of transverse colon 03/14/2024   Benign neoplasm of descending colon 03/14/2024   Benign neoplasm of sigmoid colon 03/14/2024   Chronic systolic congestive heart failure ([name]) 03/12/2024   Melena 03/12/2024   Heme  positive stool 03/11/2024   GI bleed 03/08/2024   History of CAD (coronary artery disease) 03/08/2024   CKD (chronic kidney disease), stage IV ([name]) 03/08/2024   Continuous dependence on cigarette smoking 03/08/2024   GAD (generalized anxiety disorder) 03/08/2024   HFrEF (heart failure with reduced ejection fraction) ([name]) 10/11/2023   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) and diastolic (congestive) heart failure ([name]) 10/10/2023   Pre-ulcerative calluses 04/25/2022   PAF (paroxysmal atrial fibrillation) ([name]) 05/05/2021   COPD (chronic obstructive pulmonary disease) ([name]) 05/05/2021   OSA (obstructive sleep apnea) 05/05/2021   Stage 3b chronic kidney disease ([name]) 05/05/2021   Pressure injury of skin 05/04/2021   Diabetic nephropathy ([name]) 01/02/2021   Low back pain 06/01/2020   Hyperlipidemia 04/03/2020   Peripheral artery disease 12/26/2019   NICM (nonischemic cardiomyopathy) ([name]) 06/20/2019   Non-healing ulcer ([name]) 06/20/2019   Coagulation disorder 08/09/2017   Depression 07/21/2017   At risk for adverse drug reaction 06/20/2017   Peripheral neuropathy 06/20/2017   S/P transmetatarsal amputation of foot, right ([name]) 06/05/2017   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) 05/19/2017   Obesity, class 2 02/24/2016   Anticoagulation management encounter 02/10/2016   Chronic sinus bradycardia 01/12/2016   Essential hypertension 12/22/2015   Demand ischemia ([name])    Acute on chronic diastolic CHF (congestive heart failure) ([name])  Symptomatic anemia 11/08/2015   Hypokalemia 11/08/2015   Tobacco abuse 10/23/2015   Coronary artery disease    DOE (dyspnea on exertion) 04/29/2015   Paroxysmal atrial fibrillation ([name]) 01/16/2015   Carotid artery stenosis 01/16/2015   Insomnia 02/03/2014   S/P peripheral artery angioplasty - TurboHawk atherectomy; R SFA 09/11/2013    Class: Acute   Leg pain, bilateral 08/19/2013    Hypothyroidism 07/31/2013   History of cocaine abuse ([name]) 06/13/2013   Long term current use of anticoagulant therapy 05/20/2013   Alcohol abuse    Narcotic abuse ([name])    Marijuana abuse    Alcoholic cirrhosis ([name])    Insulin  dependent type 2 diabetes mellitus ([name])     Current Outpatient Medications:    acetaminophen  (TYLENOL ) 500 MG tablet, Take 1,000 mg by mouth every 6 (six) hours as needed for headache (pain)., Disp: , Rfl:    albuterol  (VENTOLIN  HFA) 108 (90 Base) MCG/ACT inhaler, USE 2 PUFFS BY MOUTH EVERY FOUR HOURS, AS NEEDED, FOR COUGHING/WHEEZING, Disp: 8.5 g, Rfl: 0   allopurinol  (ZYLOPRIM ) 100 MG tablet, Take 150 mg by mouth daily., Disp: , Rfl:    amiodarone  (PACERONE ) 200 MG tablet, Take 0.5 tablets (100 mg total) by mouth daily., Disp: 30 tablet, Rfl: 1   budesonide -formoterol  (SYMBICORT ) 160-4.5 MCG/ACT inhaler, Inhale 2 puffs into the lungs 2 (two) times daily., Disp: 1 each, Rfl: 2   buPROPion  (WELLBUTRIN  XL) 300 MG 24 hr tablet, Take 300 mg by mouth every morning., Disp: , Rfl:    Cholecalciferol  (VITAMIN D3) 50 MCG (2000 UT) capsule, Take 1 capsule by mouth every morning., Disp: , Rfl:    colchicine  0.6 MG tablet, Take 1.2 mg by mouth See admin instructions. Take 1.2 mg for flair up and an hour later take 0.6 mg if needed, Disp: , Rfl:    ELIQUIS  5 MG TABS tablet, TAKE 1 TABLET (5 MG TOTAL) BY MOUTH 2 (TWO) TIMES DAILY., Disp: 60 tablet, Rfl: 6   fluticasone  (FLONASE) 50 MCG/ACT nasal spray, Place 1 spray into both nostrils as needed for allergies or rhinitis., Disp: , Rfl:    insulin  glargine (LANTUS ) 100 UNIT/ML injection, Inject 58 Units into the skin at bedtime., Disp: , Rfl:    insulin  lispro (HUMALOG ) 100 UNIT/ML KwikPen, Inject 15 Units into the skin 3 (three) times daily with meals. Plus sliding scale, max dose 70 units /day, Disp: 25 mL, Rfl: 1   isosorbide -hydrALAZINE  (BIDIL ) 20-37.5 MG tablet, Take 1 tablet by mouth 3 (three) times daily., Disp: 270  tablet, Rfl: 3   levothyroxine  (SYNTHROID ) 50 MCG tablet, TAKE 1 TABLET (50 MCG TOTAL) BY MOUTH DAILY BEFORE BREAKFAST (AM), Disp: 30 tablet, Rfl: 0   metoprolol  succinate (TOPROL -XL) 50 MG 24 hr tablet, Take 1 tablet (50 mg total) by mouth daily., Disp: 90 tablet, Rfl: 3   Omega-3 Fatty Acids (FISH OIL PO), Take 1 tablet by mouth daily., Disp: , Rfl:    pantoprazole  (PROTONIX ) 40 MG tablet, TAKE 1 TABLET (40 MG TOTAL) BY MOUTH DAILY(AM), Disp: 30 tablet, Rfl: 0   potassium chloride  SA (KLOR-CON  M) 20 MEQ tablet, Take 2 tablets (40 mEq total) by mouth every morning AND 1 tablet (20 mEq total) every evening., Disp: 200 tablet, Rfl: 3   rosuvastatin  (CRESTOR ) 40 MG tablet, Take 1 tablet (40 mg total) by mouth daily., Disp: 90 tablet, Rfl: 3   torsemide  (DEMADEX ) 20 MG tablet, Take 3 tablets (60 mg total) by mouth 2 (two) times daily., Disp: 200 tablet,  Rfl: 3   triamcinolone  ointment (KENALOG ) 0.1 %, Apply topically 2 (two) times daily., Disp: 454 g, Rfl: 0   VITAMIN E PO, Take 1 tablet by mouth daily., Disp: , Rfl:    Continuous Glucose Receiver (DEXCOM G7 RECEIVER) DEVI, 1 Device by Does not apply route continuous., Disp: 1 each, Rfl: 0   Continuous Glucose Sensor (DEXCOM G7 SENSOR) MISC, 1 Device by Does not apply route continuous., Disp: 9 each, Rfl: 3   Continuous Glucose Sensor (FREESTYLE LIBRE 3 PLUS SENSOR) MISC, as directed., Disp: , Rfl:    docusate sodium  (COLACE) 100 MG capsule, Take 2 capsules (200 mg total) by mouth at bedtime., Disp: 10 capsule, Rfl: 0   erythromycin ophthalmic ointment, Place 1 Application into the right eye 2 (two) times daily., Disp: , Rfl:    folic acid  (FOLVITE ) 1 MG tablet, Take 1 tablet (1 mg total) by mouth daily., Disp: 30 tablet, Rfl: 0   gabapentin  (NEURONTIN ) 100 MG capsule, Take 1 capsule (100 mg total) by mouth 3 (three) times daily., Disp: 270 capsule, Rfl: 1   Glucagon  (BAQSIMI  ONE PACK) 3 MG/DOSE POWD, Place 1 Device into the nose as needed (Low blood  sugar with impaired consciousness)., Disp: 2 each, Rfl: 3   Menthol -Camphor (ICY HOT PRO NO MESS EX), Apply 1 Application topically 3 (three) times daily as needed (arms, neuropathy lower extrimity, gout in hand.)., Disp: , Rfl:    metolazone  (ZAROXOLYN ) 2.5 MG tablet, Take 2.5 mg by mouth as needed., Disp: , Rfl:    NYAMYC  powder, Apply 1 Application topically 2 (two) times daily., Disp: , Rfl:    nystatin  cream (MYCOSTATIN ), Apply 1 Application topically 2 (two) times daily as needed for dry skin., Disp: , Rfl:    polyethylene glycol (MIRALAX  / GLYCOLAX ) 17 g packet, Take 17 g by mouth daily as needed for moderate constipation., Disp: 14 each, Rfl: 0   tirzepatide  (MOUNJARO ) 7.5 MG/0.5ML Pen, Inject 7.5 mg into the skin once a week., Disp: 6 mL, Rfl: 1 Allergies  Allergen Reactions   Neurontin  [Gabapentin ] Nausea And Vomiting and Other (See Comments)    POSSIBLE SHAKING   Lyrica  [Pregabalin ] Other (See Comments)    Shaking    Other     Neurontin        Social History   Socioeconomic History   Marital status: Single    Spouse name: Not on file   Number of children: 1   Years of education: 12   Highest education level: 12th grade  Occupational History   Occupation: disabled  Tobacco Use   Smoking status: Every Day    Current packs/day: 1.00    Average packs/day: 1 pack/day for 44.0 years (44.0 ttl pk-yrs)    Types: E-cigarettes, Cigarettes   Smokeless tobacco: Former    Types: Snuff  Vaping Use   Vaping status: Former   Devices: 11/26/2018 stopped months ago  Substance and Sexual Activity   Alcohol use: Not Currently    Comment: occ   Drug use: Yes    Types: Crack cocaine, Marijuana, Oxycodone    Sexual activity: Not Currently  Other Topics Concern   Not on file  Social History Narrative   ** Merged History Encounter **       Lives in [place], in motel with sister.  They are looking to move but don't have a place to go yet.     Social Drivers of Health   Financial  Resource Strain: Low Risk  (02/09/2023)   Overall  Financial Resource Strain (CARDIA)    Difficulty of Paying Living Expenses: Not hard at all  Food Insecurity: No Food Insecurity (03/09/2024)   Hunger Vital Sign    Worried About Running Out of Food in the Last Year: Never true    Ran Out of Food in the Last Year: Never true  Transportation Needs: Unmet Transportation Needs (03/09/2024)   PRAPARE - Transportation    Lack of Transportation (Medical): Yes    Lack of Transportation (Non-Medical): Yes  Physical Activity: Inactive (02/09/2023)   Exercise Vital Sign    Days of Exercise per Week: 0 days    Minutes of Exercise per Session: 0 min  Stress: No Stress Concern Present (02/09/2023)   [employer] of Occupational Health - Occupational Stress Questionnaire    Feeling of Stress : Not at all  Social Connections: Unknown (08/21/2023)   Received from [facility]   Social Network    Social Network: Not on file  Intimate Partner Violence: Not At Risk (03/09/2024)   Humiliation, Afraid, Rape, and Kick questionnaire    Fear of Current or Ex-Partner: No    Emotionally Abused: No    Physically Abused: No    Sexually Abused: No    Physical Exam      Future Appointments  Date Time Provider Department Center  08/28/2024 11:00 AM [facility] None  09/02/2024  1:45 PM [facility] LAB [facility] None  09/04/2024 11:00 AM [facility] None  09/11/2024 11:00 AM [facility] None  09/12/2024  2:30 PM [facility] PA/NP [facility] None  10/31/2024 10:30 AM Aniceto Daphne CROME, NP [facility] H&V       [name], Paramedic [phone] [facility] Paramedic  08/26/24

## 2024-08-27 ENCOUNTER — Telehealth: Payer: Self-pay | Admitting: Family

## 2024-08-27 ENCOUNTER — Telehealth: Payer: Self-pay

## 2024-08-27 MED ORDER — METOLAZONE 2.5 MG PO TABS: 2.5000 mg | ORAL_TABLET | ORAL | 0 refills | Status: DC

## 2024-08-27 NOTE — Telephone Encounter (Signed)
  Cardiomems Remote Monitoring  S/P Cardiomems Implant 02/05/20  PAD Goal: 20 Most recent reading: 31 indicating fluid accumulation  Recommended changes: Have reached out to [name], paramedicine regarding changes.  Metolazone  2.5mg  every other day X 3 doses. No extra potassium needed. BMET next week. Patient had recently gone to a cookout and ate high salt foods such as chlitlins, ribs, bbq and fixings. Encouraged to monitor sodium intake carefully.    I continue to review and analyze the patients PA pressures weekly (and more often as needed) to bring PA pressures within the optimal range.    [name] DELENA Class [facility] 08/27/24

## 2024-08-27 NOTE — Progress Notes (Signed)
 [name], paramedicine called requesting Metolazone  be sent to pt's pharmacy per [name] Class, FNP regarding pt's Cardiomems.  Rx sent to [facility] at request.

## 2024-08-28 ENCOUNTER — Telehealth (HOSPITAL_COMMUNITY): Payer: Self-pay

## 2024-08-28 ENCOUNTER — Inpatient Hospital Stay (HOSPITAL_COMMUNITY)
Admission: RE | Admit: 2024-08-28 | Discharge: 2024-08-28 | Disposition: A | Source: Ambulatory Visit | Attending: Internal Medicine | Admitting: Internal Medicine

## 2024-08-28 VITALS — BP 124/65 | HR 77 | Temp 98.0°F | Resp 17

## 2024-08-28 DIAGNOSIS — D631 Anemia in chronic kidney disease: Secondary | ICD-10-CM

## 2024-08-28 DIAGNOSIS — N183 Chronic kidney disease, stage 3 unspecified: Secondary | ICD-10-CM | POA: Diagnosis not present

## 2024-08-28 MED ORDER — IRON SUCROSE 300 MG IVPB - SIMPLE MED
300.0000 mg | Freq: Once | Status: AC
  Administered 2024-08-28: 300 mg via INTRAVENOUS
  Filled 2024-08-28: qty 300

## 2024-08-28 NOTE — Telephone Encounter (Signed)
 Planned on p/u metolazone  this morning from pharmacy to take to place in pill box per Tina's instructions.  However when I got to pharm they were closed due to IT issues.  I went back later to get it, but pt had iron  infusion at 11 and then she was out running errands and didn't get back home until almost 5pm. So with this med being a diuretic, she did not want to take it this late in the day so the plan is for me to go out first thing in the morning and place in pill box.    [name], EMT-Paramedic  [phone] 08/28/2024

## 2024-08-29 ENCOUNTER — Other Ambulatory Visit (HOSPITAL_COMMUNITY): Payer: Self-pay

## 2024-08-29 NOTE — Progress Notes (Signed)
 Paramedicine Encounter    Patient ID: [name], female    DOB: Apr 19, 1961, 63 y.o.   MRN: [id number]  Came out today to place metolazone  in pill box for today, sat and mon.  She has labs on Monday, reminded her of that appointment.   Will f/u after labs are resulted.    Patient Care Team: Campbell Reynolds, NP as PCP - General Court Dorn PARAS, MD as PCP - Cardiology (Cardiology) Rolan Ezra GORMAN, MD as PCP - Advanced Heart Failure (Cardiology) Inocencio Soyla Lunger, MD as PCP - Electrophysiology (Cardiology) Court Dorn PARAS, MD as Consulting Physician (Cardiology) Darlean Ozell NOVAK, MD as Consulting Physician (Pulmonary Disease) System, Provider Not In  Patient Active Problem List   Diagnosis Date Noted   Anemia of chronic renal failure 08/06/2024   Persistent atrial fibrillation ([name]) 03/15/2024   Benign neoplasm of ascending colon 03/14/2024   Benign neoplasm of transverse colon 03/14/2024   Benign neoplasm of descending colon 03/14/2024   Benign neoplasm of sigmoid colon 03/14/2024   Chronic systolic congestive heart failure ([name]) 03/12/2024   Melena 03/12/2024   Heme positive stool 03/11/2024   GI bleed 03/08/2024   History of CAD (coronary artery disease) 03/08/2024   CKD (chronic kidney disease), stage IV ([name]) 03/08/2024   Continuous dependence on cigarette smoking 03/08/2024   GAD (generalized anxiety disorder) 03/08/2024   HFrEF (heart failure with reduced ejection fraction) ([name]) 10/11/2023   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) and diastolic (congestive) heart failure ([name]) 10/10/2023   Pre-ulcerative calluses 04/25/2022   PAF (paroxysmal atrial fibrillation) ([name]) 05/05/2021   COPD (chronic obstructive pulmonary disease) ([name]) 05/05/2021   OSA (obstructive sleep apnea) 05/05/2021   Stage 3b chronic kidney disease ([name]) 05/05/2021   Pressure injury of skin 05/04/2021   Diabetic nephropathy ([name]) 01/02/2021   Low back pain 06/01/2020    Hyperlipidemia 04/03/2020   Peripheral artery disease 12/26/2019   NICM (nonischemic cardiomyopathy) ([name]) 06/20/2019   Non-healing ulcer ([name]) 06/20/2019   Coagulation disorder 08/09/2017   Depression 07/21/2017   At risk for adverse drug reaction 06/20/2017   Peripheral neuropathy 06/20/2017   S/P transmetatarsal amputation of foot, right ([name]) 06/05/2017   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) 05/19/2017   Obesity, class 2 02/24/2016   Anticoagulation management encounter 02/10/2016   Chronic sinus bradycardia 01/12/2016   Essential hypertension 12/22/2015   Demand ischemia ([name])    Acute on chronic diastolic CHF (congestive heart failure) ([name])    Symptomatic anemia 11/08/2015   Hypokalemia 11/08/2015   Tobacco abuse 10/23/2015   Coronary artery disease    DOE (dyspnea on exertion) 04/29/2015   Paroxysmal atrial fibrillation ([name]) 01/16/2015   Carotid artery stenosis 01/16/2015   Insomnia 02/03/2014   S/P peripheral artery angioplasty - TurboHawk atherectomy; R SFA 09/11/2013    Class: Acute   Leg pain, bilateral 08/19/2013   Hypothyroidism 07/31/2013   History of cocaine abuse ([name]) 06/13/2013   Long term current use of anticoagulant therapy 05/20/2013   Alcohol abuse    Narcotic abuse ([name])    Marijuana abuse    Alcoholic cirrhosis ([name])    Insulin  dependent type 2 diabetes mellitus ([name])     Current Outpatient Medications:    acetaminophen  (TYLENOL ) 500 MG tablet, Take 1,000 mg by mouth every 6 (six) hours as needed for headache (pain)., Disp: , Rfl:    albuterol  (VENTOLIN  HFA) 108 (90 Base) MCG/ACT inhaler, USE 2 PUFFS BY MOUTH EVERY  FOUR HOURS, AS NEEDED, FOR COUGHING/WHEEZING, Disp: 8.5 g, Rfl: 0   allopurinol  (ZYLOPRIM ) 100 MG tablet, Take 150 mg by mouth daily., Disp: , Rfl:    amiodarone  (PACERONE ) 200 MG tablet, Take 0.5 tablets (100 mg total) by mouth daily., Disp: 30 tablet, Rfl: 1    budesonide -formoterol  (SYMBICORT ) 160-4.5 MCG/ACT inhaler, Inhale 2 puffs into the lungs 2 (two) times daily., Disp: 1 each, Rfl: 2   buPROPion  (WELLBUTRIN  XL) 300 MG 24 hr tablet, Take 300 mg by mouth every morning., Disp: , Rfl:    Cholecalciferol  (VITAMIN D3) 50 MCG (2000 UT) capsule, Take 1 capsule by mouth every morning., Disp: , Rfl:    colchicine  0.6 MG tablet, Take 1.2 mg by mouth See admin instructions. Take 1.2 mg for flair up and an hour later take 0.6 mg if needed, Disp: , Rfl:    Continuous Glucose Receiver (DEXCOM G7 RECEIVER) DEVI, 1 Device by Does not apply route continuous., Disp: 1 each, Rfl: 0   Continuous Glucose Sensor (DEXCOM G7 SENSOR) MISC, 1 Device by Does not apply route continuous., Disp: 9 each, Rfl: 3   Continuous Glucose Sensor (FREESTYLE LIBRE 3 PLUS SENSOR) MISC, as directed., Disp: , Rfl:    docusate sodium  (COLACE) 100 MG capsule, Take 2 capsules (200 mg total) by mouth at bedtime., Disp: 10 capsule, Rfl: 0   ELIQUIS  5 MG TABS tablet, TAKE 1 TABLET (5 MG TOTAL) BY MOUTH 2 (TWO) TIMES DAILY., Disp: 60 tablet, Rfl: 6   erythromycin ophthalmic ointment, Place 1 Application into the right eye 2 (two) times daily., Disp: , Rfl:    fluticasone  (FLONASE) 50 MCG/ACT nasal spray, Place 1 spray into both nostrils as needed for allergies or rhinitis., Disp: , Rfl:    folic acid  (FOLVITE ) 1 MG tablet, Take 1 tablet (1 mg total) by mouth daily., Disp: 30 tablet, Rfl: 0   gabapentin  (NEURONTIN ) 100 MG capsule, Take 1 capsule (100 mg total) by mouth 3 (three) times daily., Disp: 270 capsule, Rfl: 1   Glucagon  (BAQSIMI  ONE PACK) 3 MG/DOSE POWD, Place 1 Device into the nose as needed (Low blood sugar with impaired consciousness)., Disp: 2 each, Rfl: 3   insulin  glargine (LANTUS ) 100 UNIT/ML injection, Inject 58 Units into the skin at bedtime., Disp: , Rfl:    insulin  lispro (HUMALOG ) 100 UNIT/ML KwikPen, Inject 15 Units into the skin 3 (three) times daily with meals. Plus sliding  scale, max dose 70 units /day, Disp: 25 mL, Rfl: 1   isosorbide -hydrALAZINE  (BIDIL ) 20-37.5 MG tablet, Take 1 tablet by mouth 3 (three) times daily., Disp: 270 tablet, Rfl: 3   levothyroxine  (SYNTHROID ) 50 MCG tablet, TAKE 1 TABLET (50 MCG TOTAL) BY MOUTH DAILY BEFORE BREAKFAST (AM), Disp: 30 tablet, Rfl: 0   Menthol -Camphor (ICY HOT PRO NO MESS EX), Apply 1 Application topically 3 (three) times daily as needed (arms, neuropathy lower extrimity, gout in hand.)., Disp: , Rfl:    metolazone  (ZAROXOLYN ) 2.5 MG tablet, Take 2.5 mg by mouth as needed., Disp: , Rfl:    metolazone  (ZAROXOLYN ) 2.5 MG tablet, Take 1 tablet (2.5 mg total) by mouth every other day for 3 doses., Disp: 3 tablet, Rfl: 0   metoprolol  succinate (TOPROL -XL) 50 MG 24 hr tablet, Take 1 tablet (50 mg total) by mouth daily., Disp: 90 tablet, Rfl: 3   NYAMYC  powder, Apply 1 Application topically 2 (two) times daily., Disp: , Rfl:    nystatin  cream (MYCOSTATIN ), Apply 1 Application topically 2 (two) times daily as  needed for dry skin., Disp: , Rfl:    Omega-3 Fatty Acids (FISH OIL PO), Take 1 tablet by mouth daily., Disp: , Rfl:    pantoprazole  (PROTONIX ) 40 MG tablet, TAKE 1 TABLET (40 MG TOTAL) BY MOUTH DAILY(AM), Disp: 30 tablet, Rfl: 0   polyethylene glycol (MIRALAX  / GLYCOLAX ) 17 g packet, Take 17 g by mouth daily as needed for moderate constipation., Disp: 14 each, Rfl: 0   potassium chloride  SA (KLOR-CON  M) 20 MEQ tablet, Take 2 tablets (40 mEq total) by mouth every morning AND 1 tablet (20 mEq total) every evening., Disp: 200 tablet, Rfl: 3   rosuvastatin  (CRESTOR ) 40 MG tablet, Take 1 tablet (40 mg total) by mouth daily., Disp: 90 tablet, Rfl: 3   tirzepatide  (MOUNJARO ) 7.5 MG/0.5ML Pen, Inject 7.5 mg into the skin once a week., Disp: 6 mL, Rfl: 1   torsemide  (DEMADEX ) 20 MG tablet, Take 3 tablets (60 mg total) by mouth 2 (two) times daily., Disp: 200 tablet, Rfl: 3   triamcinolone  ointment (KENALOG ) 0.1 %, Apply topically 2 (two)  times daily., Disp: 454 g, Rfl: 0   VITAMIN E PO, Take 1 tablet by mouth daily., Disp: , Rfl:  Allergies  Allergen Reactions   Neurontin  [Gabapentin ] Nausea And Vomiting and Other (See Comments)    POSSIBLE SHAKING   Lyrica  [Pregabalin ] Other (See Comments)    Shaking    Other     Neurontin        Social History   Socioeconomic History   Marital status: Single    Spouse name: Not on file   Number of children: 1   Years of education: 12   Highest education level: 12th grade  Occupational History   Occupation: disabled  Tobacco Use   Smoking status: Every Day    Current packs/day: 1.00    Average packs/day: 1 pack/day for 44.0 years (44.0 ttl pk-yrs)    Types: E-cigarettes, Cigarettes   Smokeless tobacco: Former    Types: Snuff  Vaping Use   Vaping status: Former   Devices: 11/26/2018 stopped months ago  Substance and Sexual Activity   Alcohol use: Not Currently    Comment: occ   Drug use: Yes    Types: Crack cocaine, Marijuana, Oxycodone    Sexual activity: Not Currently  Other Topics Concern   Not on file  Social History Narrative   ** Merged History Encounter **       Lives in [place], in motel with sister.  They are looking to move but don't have a place to go yet.     Social Drivers of Corporate investment banker Strain: Low Risk  (02/09/2023)   Overall Financial Resource Strain (CARDIA)    Difficulty of Paying Living Expenses: Not hard at all  Food Insecurity: No Food Insecurity (03/09/2024)   Hunger Vital Sign    Worried About Running Out of Food in the Last Year: Never true    Ran Out of Food in the Last Year: Never true  Transportation Needs: Unmet Transportation Needs (03/09/2024)   PRAPARE - Transportation    Lack of Transportation (Medical): Yes    Lack of Transportation (Non-Medical): Yes  Physical Activity: Inactive (02/09/2023)   Exercise Vital Sign    Days of Exercise per Week: 0 days    Minutes of Exercise per Session: 0 min  Stress: No Stress  Concern Present (02/09/2023)   [employer] of Occupational Health - Occupational Stress Questionnaire    Feeling of Stress : Not at  all  Social Connections: Unknown (08/21/2023)   Received from [facility]   Social Network    Social Network: Not on file  Intimate Partner Violence: Not At Risk (03/09/2024)   Humiliation, Afraid, Rape, and Kick questionnaire    Fear of Current or Ex-Partner: No    Emotionally Abused: No    Physically Abused: No    Sexually Abused: No    Physical Exam      Future Appointments  Date Time Provider Department Center  09/02/2024  1:45 PM [facility] LAB [facility] None  09/04/2024 11:00 AM [facility] None  09/11/2024 11:00 AM [facility] None  09/12/2024  2:30 PM [facility] PA/NP [facility] None  10/31/2024 10:30 AM Aniceto Daphne CROME, NP [facility] H&V       [name], Paramedic [phone] [facility] Paramedic  08/29/24

## 2024-09-02 ENCOUNTER — Ambulatory Visit (HOSPITAL_COMMUNITY)
Admission: RE | Admit: 2024-09-02 | Discharge: 2024-09-02 | Disposition: A | Discharge status: Home / Self Care | Source: Ambulatory Visit | Attending: Cardiology | Admitting: Cardiology

## 2024-09-02 ENCOUNTER — Other Ambulatory Visit (HOSPITAL_COMMUNITY): Payer: Self-pay

## 2024-09-02 DIAGNOSIS — I5022 Chronic systolic (congestive) heart failure: Secondary | ICD-10-CM | POA: Insufficient documentation

## 2024-09-02 LAB — BASIC METABOLIC PANEL WITH GFR
Anion gap: 14 (ref 5–15)
BUN: 62 mg/dL — ABNORMAL HIGH (ref 8–23)
CO2: 27 mmol/L (ref 22–32)
Calcium: 9.1 mg/dL (ref 8.9–10.3)
Chloride: 99 mmol/L (ref 98–111)
Creatinine, Ser: 2.72 mg/dL — ABNORMAL HIGH (ref 0.44–1.00)
GFR, Estimated: 19 mL/min — ABNORMAL LOW (ref >60)
Glucose, Bld: 104 mg/dL — ABNORMAL HIGH (ref 70–99)
Potassium: 3.6 mmol/L (ref 3.5–5.1)
Sodium: 140 mmol/L (ref 135–145)

## 2024-09-02 NOTE — Progress Notes (Addendum)
 Paramedicine Encounter    Patient ID: [name], female    DOB: 1960-11-21, 63 y.o.   MRN: [id number]   Complaints-knee and foot pain (chronic)   Edema-much improved   Compliance with meds-yes  Pill box filled-yes If so, by whom-paramedic   Refills needed-none   Pt reports she is doing good other than the foot and knee pain. Her legs are much better. Last week she had a place on her leg that was leaking, that is healed up now.  She denies increased sob, no dizziness, no c/p.  No other issues or concerns right now.  Weight is heading in right direction. She still has some swelling to her legs but much better. Her last dose of the metolazone  was today.  She does report good urine output with the metolazone  use.  She had labs done today.  Will f/u next week unless needed sooner.   Meds verified and pill box refilled.    CBG PTA-200 BP 120/60   Pulse 64   Resp 18   Wt 216 lb (98 kg)   LMP  (LMP Unknown)   SpO2 96%   BMI 38.26 kg/m  Weight yesterday-? Last visit weight-228  Patient Care Team: Campbell Reynolds, NP as PCP - General Court Dorn PARAS, MD as PCP - Cardiology (Cardiology) Rolan Ezra GORMAN, MD as PCP - Advanced Heart Failure (Cardiology) Inocencio Soyla Lunger, MD as PCP - Electrophysiology (Cardiology) Court Dorn PARAS, MD as Consulting Physician (Cardiology) Darlean Ozell NOVAK, MD as Consulting Physician (Pulmonary Disease) System, Provider Not In  Patient Active Problem List   Diagnosis Date Noted   Anemia of chronic renal failure 08/06/2024   Persistent atrial fibrillation ([name]) 03/15/2024   Benign neoplasm of ascending colon 03/14/2024   Benign neoplasm of transverse colon 03/14/2024   Benign neoplasm of descending colon 03/14/2024   Benign neoplasm of sigmoid colon 03/14/2024   Chronic systolic congestive heart failure ([name]) 03/12/2024   Melena 03/12/2024   Heme positive stool 03/11/2024   GI bleed 03/08/2024   History of CAD (coronary artery  disease) 03/08/2024   CKD (chronic kidney disease), stage IV ([name]) 03/08/2024   Continuous dependence on cigarette smoking 03/08/2024   GAD (generalized anxiety disorder) 03/08/2024   HFrEF (heart failure with reduced ejection fraction) ([name]) 10/11/2023   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) and diastolic (congestive) heart failure ([name]) 10/10/2023   Pre-ulcerative calluses 04/25/2022   PAF (paroxysmal atrial fibrillation) ([name]) 05/05/2021   COPD (chronic obstructive pulmonary disease) ([name]) 05/05/2021   OSA (obstructive sleep apnea) 05/05/2021   Stage 3b chronic kidney disease ([name]) 05/05/2021   Pressure injury of skin 05/04/2021   Diabetic nephropathy ([name]) 01/02/2021   Low back pain 06/01/2020   Hyperlipidemia 04/03/2020   Peripheral artery disease 12/26/2019   NICM (nonischemic cardiomyopathy) ([name]) 06/20/2019   Non-healing ulcer ([name]) 06/20/2019   Coagulation disorder 08/09/2017   Depression 07/21/2017   At risk for adverse drug reaction 06/20/2017   Peripheral neuropathy 06/20/2017   S/P transmetatarsal amputation of foot, right ([name]) 06/05/2017   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) 05/19/2017   Obesity, class 2 02/24/2016   Anticoagulation management encounter 02/10/2016   Chronic sinus bradycardia 01/12/2016   Essential hypertension 12/22/2015   Demand ischemia ([name])    Acute on chronic diastolic CHF (congestive heart failure) ([name])    Symptomatic anemia 11/08/2015   Hypokalemia 11/08/2015   Tobacco abuse 10/23/2015   Coronary artery disease  DOE (dyspnea on exertion) 04/29/2015   Paroxysmal atrial fibrillation ([name]) 01/16/2015   Carotid artery stenosis 01/16/2015   Insomnia 02/03/2014   S/P peripheral artery angioplasty - TurboHawk atherectomy; R SFA 09/11/2013    Class: Acute   Leg pain, bilateral 08/19/2013   Hypothyroidism 07/31/2013   History of cocaine abuse ([name]) 06/13/2013   Long  term current use of anticoagulant therapy 05/20/2013   Alcohol abuse    Narcotic abuse ([name])    Marijuana abuse    Alcoholic cirrhosis ([name])    Insulin  dependent type 2 diabetes mellitus ([name])     Current Outpatient Medications:    acetaminophen  (TYLENOL ) 500 MG tablet, Take 1,000 mg by mouth every 6 (six) hours as needed for headache (pain)., Disp: , Rfl:    albuterol  (VENTOLIN  HFA) 108 (90 Base) MCG/ACT inhaler, USE 2 PUFFS BY MOUTH EVERY FOUR HOURS, AS NEEDED, FOR COUGHING/WHEEZING, Disp: 8.5 g, Rfl: 0   allopurinol  (ZYLOPRIM ) 100 MG tablet, Take 150 mg by mouth daily., Disp: , Rfl:    amiodarone  (PACERONE ) 200 MG tablet, Take 0.5 tablets (100 mg total) by mouth daily., Disp: 30 tablet, Rfl: 1   budesonide -formoterol  (SYMBICORT ) 160-4.5 MCG/ACT inhaler, Inhale 2 puffs into the lungs 2 (two) times daily., Disp: 1 each, Rfl: 2   buPROPion  (WELLBUTRIN  XL) 300 MG 24 hr tablet, Take 300 mg by mouth every morning., Disp: , Rfl:    Cholecalciferol  (VITAMIN D3) 50 MCG (2000 UT) capsule, Take 1 capsule by mouth every morning., Disp: , Rfl:    colchicine  0.6 MG tablet, Take 1.2 mg by mouth See admin instructions. Take 1.2 mg for flair up and an hour later take 0.6 mg if needed, Disp: , Rfl:    Continuous Glucose Sensor (FREESTYLE LIBRE 3 PLUS SENSOR) MISC, as directed., Disp: , Rfl:    ELIQUIS  5 MG TABS tablet, TAKE 1 TABLET (5 MG TOTAL) BY MOUTH 2 (TWO) TIMES DAILY., Disp: 60 tablet, Rfl: 6   fluticasone  (FLONASE) 50 MCG/ACT nasal spray, Place 1 spray into both nostrils as needed for allergies or rhinitis., Disp: , Rfl:    insulin  glargine (LANTUS ) 100 UNIT/ML injection, Inject 58 Units into the skin at bedtime., Disp: , Rfl:    insulin  lispro (HUMALOG ) 100 UNIT/ML KwikPen, Inject 15 Units into the skin 3 (three) times daily with meals. Plus sliding scale, max dose 70 units /day, Disp: 25 mL, Rfl: 1   isosorbide -hydrALAZINE  (BIDIL ) 20-37.5 MG tablet, Take 1 tablet by mouth 3 (three) times daily.,  Disp: 270 tablet, Rfl: 3   levothyroxine  (SYNTHROID ) 50 MCG tablet, TAKE 1 TABLET (50 MCG TOTAL) BY MOUTH DAILY BEFORE BREAKFAST (AM), Disp: 30 tablet, Rfl: 0   metolazone  (ZAROXOLYN ) 2.5 MG tablet, Take 2.5 mg by mouth as needed., Disp: , Rfl:    metoprolol  succinate (TOPROL -XL) 50 MG 24 hr tablet, Take 1 tablet (50 mg total) by mouth daily., Disp: 90 tablet, Rfl: 3   pantoprazole  (PROTONIX ) 40 MG tablet, TAKE 1 TABLET (40 MG TOTAL) BY MOUTH DAILY(AM), Disp: 30 tablet, Rfl: 0   potassium chloride  SA (KLOR-CON  M) 20 MEQ tablet, Take 2 tablets (40 mEq total) by mouth every morning AND 1 tablet (20 mEq total) every evening., Disp: 200 tablet, Rfl: 3   rosuvastatin  (CRESTOR ) 40 MG tablet, Take 1 tablet (40 mg total) by mouth daily., Disp: 90 tablet, Rfl: 3   tirzepatide  (MOUNJARO ) 7.5 MG/0.5ML Pen, Inject 7.5 mg into the skin once a week., Disp: 6 mL, Rfl: 1   torsemide  (DEMADEX )  20 MG tablet, Take 3 tablets (60 mg total) by mouth 2 (two) times daily., Disp: 200 tablet, Rfl: 3   VITAMIN E PO, Take 1 tablet by mouth daily., Disp: , Rfl:    Continuous Glucose Receiver (DEXCOM G7 RECEIVER) DEVI, 1 Device by Does not apply route continuous., Disp: 1 each, Rfl: 0   Continuous Glucose Sensor (DEXCOM G7 SENSOR) MISC, 1 Device by Does not apply route continuous., Disp: 9 each, Rfl: 3   docusate sodium  (COLACE) 100 MG capsule, Take 2 capsules (200 mg total) by mouth at bedtime., Disp: 10 capsule, Rfl: 0   erythromycin ophthalmic ointment, Place 1 Application into the right eye 2 (two) times daily., Disp: , Rfl:    folic acid  (FOLVITE ) 1 MG tablet, Take 1 tablet (1 mg total) by mouth daily., Disp: 30 tablet, Rfl: 0   gabapentin  (NEURONTIN ) 100 MG capsule, Take 1 capsule (100 mg total) by mouth 3 (three) times daily., Disp: 270 capsule, Rfl: 1   Glucagon  (BAQSIMI  ONE PACK) 3 MG/DOSE POWD, Place 1 Device into the nose as needed (Low blood sugar with impaired consciousness)., Disp: 2 each, Rfl: 3   Menthol -Camphor  (ICY HOT PRO NO MESS EX), Apply 1 Application topically 3 (three) times daily as needed (arms, neuropathy lower extrimity, gout in hand.)., Disp: , Rfl:    metolazone  (ZAROXOLYN ) 2.5 MG tablet, Take 1 tablet (2.5 mg total) by mouth every other day for 3 doses., Disp: 3 tablet, Rfl: 0   NYAMYC  powder, Apply 1 Application topically 2 (two) times daily., Disp: , Rfl:    nystatin  cream (MYCOSTATIN ), Apply 1 Application topically 2 (two) times daily as needed for dry skin., Disp: , Rfl:    Omega-3 Fatty Acids (FISH OIL PO), Take 1 tablet by mouth daily., Disp: , Rfl:    polyethylene glycol (MIRALAX  / GLYCOLAX ) 17 g packet, Take 17 g by mouth daily as needed for moderate constipation., Disp: 14 each, Rfl: 0   triamcinolone  ointment (KENALOG ) 0.1 %, Apply topically 2 (two) times daily., Disp: 454 g, Rfl: 0 Allergies  Allergen Reactions   Neurontin  Evers.Finer ] Nausea And Vomiting and Other (See Comments)    POSSIBLE SHAKING   Lyrica  [Pregabalin ] Other (See Comments)    Shaking    Other     Neurontin        Social History   Socioeconomic History   Marital status: Single    Spouse name: Not on file   Number of children: 1   Years of education: 12   Highest education level: 12th grade  Occupational History   Occupation: disabled  Tobacco Use   Smoking status: Every Day    Current packs/day: 1.00    Average packs/day: 1 pack/day for 44.0 years (44.0 ttl pk-yrs)    Types: E-cigarettes, Cigarettes   Smokeless tobacco: Former    Types: Snuff  Vaping Use   Vaping status: Former   Devices: 11/26/2018 stopped months ago  Substance and Sexual Activity   Alcohol use: Not Currently    Comment: occ   Drug use: Yes    Types: Crack cocaine, Marijuana, Oxycodone    Sexual activity: Not Currently  Other Topics Concern   Not on file  Social History Narrative   ** Merged History Encounter **       Lives in [place], in motel with sister.  They are looking to move but don't have a place to go yet.      Social Drivers of Corporate investment banker Strain: Low  Risk  (02/09/2023)   Overall Financial Resource Strain (CARDIA)    Difficulty of Paying Living Expenses: Not hard at all  Food Insecurity: No Food Insecurity (03/09/2024)   Hunger Vital Sign    Worried About Running Out of Food in the Last Year: Never true    Ran Out of Food in the Last Year: Never true  Transportation Needs: Unmet Transportation Needs (03/09/2024)   PRAPARE - Transportation    Lack of Transportation (Medical): Yes    Lack of Transportation (Non-Medical): Yes  Physical Activity: Inactive (02/09/2023)   Exercise Vital Sign    Days of Exercise per Week: 0 days    Minutes of Exercise per Session: 0 min  Stress: No Stress Concern Present (02/09/2023)   [employer] of Occupational Health - Occupational Stress Questionnaire    Feeling of Stress : Not at all  Social Connections: Unknown (08/21/2023)   Received from [facility]   Social Network    Social Network: Not on file  Intimate Partner Violence: Not At Risk (03/09/2024)   Humiliation, Afraid, Rape, and Kick questionnaire    Fear of Current or Ex-Partner: No    Emotionally Abused: No    Physically Abused: No    Sexually Abused: No    Physical Exam      Future Appointments  Date Time Provider Department Center  09/04/2024 11:00 AM [facility] None  09/11/2024 11:00 AM [facility] None  09/12/2024  2:30 PM [facility] PA/NP [facility] None  10/31/2024 10:30 AM Aniceto Daphne CROME, NP [facility] H&V       [name], Paramedic [phone] [facility] Paramedic  09/02/24

## 2024-09-04 ENCOUNTER — Encounter (HOSPITAL_COMMUNITY)
Admission: RE | Admit: 2024-09-04 | Discharge: 2024-09-04 | Disposition: A | Discharge status: Home / Self Care | Source: Ambulatory Visit | Attending: Internal Medicine | Admitting: Internal Medicine

## 2024-09-04 VITALS — BP 143/68 | HR 64 | Temp 97.5°F | Resp 16

## 2024-09-04 DIAGNOSIS — N183 Chronic kidney disease, stage 3 unspecified: Secondary | ICD-10-CM

## 2024-09-04 MED ORDER — IRON SUCROSE 300 MG IVPB - SIMPLE MED
300.0000 mg | Freq: Once | Status: AC
  Administered 2024-09-04: 300 mg via INTRAVENOUS
  Filled 2024-09-04: qty 300

## 2024-09-09 ENCOUNTER — Other Ambulatory Visit (HOSPITAL_COMMUNITY): Payer: Self-pay

## 2024-09-09 NOTE — Progress Notes (Signed)
 Paramedicine Encounter    Patient ID: [name], female    DOB: Jul 08, 1961, 63 y.o.   MRN: [id number]   Complaints-aches and pains  Edema-much improved   Compliance with meds-yes   Pill box filled-yes  If so, by whom-paramedic   Refills needed- Allopurinol  Potassium      Pt went to PCP today and they increased mounjaro  to 1mg  and decreased her other meal time insulin  5U.  She reports aches and pains in her shoulder and hands.  PCP also refilled her folic acid .  Denies increased sob, no dizziness, no c/p.  Edema much improved. Her weight is better, however she has not been using her cardiomems.   Meds verified and pill box refilled.  Reminded her of clinic appoint on thurs.  Will f/u with her then.    BP 118/68   Pulse 87   Resp 16   Wt 211 lb (95.7 kg)   LMP  (LMP Unknown)   SpO2 95%   BMI 37.38 kg/m  Weight yesterday-? Last visit weight-216  Patient Care Team: Campbell Reynolds, NP as PCP - General Court Dorn PARAS, MD as PCP - Cardiology (Cardiology) Rolan Ezra GORMAN, MD as PCP - Advanced Heart Failure (Cardiology) Inocencio Soyla Lunger, MD as PCP - Electrophysiology (Cardiology) Court Dorn PARAS, MD as Consulting Physician (Cardiology) Darlean Ozell NOVAK, MD as Consulting Physician (Pulmonary Disease) System, Provider Not In  Patient Active Problem List   Diagnosis Date Noted   Anemia of chronic renal failure 08/06/2024   Persistent atrial fibrillation ([name]) 03/15/2024   Benign neoplasm of ascending colon 03/14/2024   Benign neoplasm of transverse colon 03/14/2024   Benign neoplasm of descending colon 03/14/2024   Benign neoplasm of sigmoid colon 03/14/2024   Chronic systolic congestive heart failure ([name]) 03/12/2024   Melena 03/12/2024   Heme positive stool 03/11/2024   GI bleed 03/08/2024   History of CAD (coronary artery disease) 03/08/2024   CKD (chronic kidney disease), stage IV ([name]) 03/08/2024   Continuous dependence on cigarette smoking  03/08/2024   GAD (generalized anxiety disorder) 03/08/2024   HFrEF (heart failure with reduced ejection fraction) ([name]) 10/11/2023   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) and diastolic (congestive) heart failure ([name]) 10/10/2023   Pre-ulcerative calluses 04/25/2022   PAF (paroxysmal atrial fibrillation) ([name]) 05/05/2021   COPD (chronic obstructive pulmonary disease) ([name]) 05/05/2021   OSA (obstructive sleep apnea) 05/05/2021   Stage 3b chronic kidney disease ([name]) 05/05/2021   Pressure injury of skin 05/04/2021   Diabetic nephropathy ([name]) 01/02/2021   Low back pain 06/01/2020   Hyperlipidemia 04/03/2020   Peripheral artery disease 12/26/2019   NICM (nonischemic cardiomyopathy) ([name]) 06/20/2019   Non-healing ulcer ([name]) 06/20/2019   Coagulation disorder 08/09/2017   Depression 07/21/2017   At risk for adverse drug reaction 06/20/2017   Peripheral neuropathy 06/20/2017   S/P transmetatarsal amputation of foot, right ([name]) 06/05/2017   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) 05/19/2017   Obesity, class 2 02/24/2016   Anticoagulation management encounter 02/10/2016   Chronic sinus bradycardia 01/12/2016   Essential hypertension 12/22/2015   Demand ischemia ([name])    Acute on chronic diastolic CHF (congestive heart failure) ([name])    Symptomatic anemia 11/08/2015   Hypokalemia 11/08/2015   Tobacco abuse 10/23/2015   Coronary artery disease    DOE (dyspnea on exertion) 04/29/2015   Paroxysmal atrial fibrillation ([name]) 01/16/2015   Carotid artery stenosis 01/16/2015   Insomnia 02/03/2014   S/P  peripheral artery angioplasty - TurboHawk atherectomy; R SFA 09/11/2013    Class: Acute   Leg pain, bilateral 08/19/2013   Hypothyroidism 07/31/2013   History of cocaine abuse ([name]) 06/13/2013   Long term current use of anticoagulant therapy 05/20/2013   Alcohol abuse    Narcotic abuse ([name])    Marijuana abuse    Alcoholic  cirrhosis ([name])    Insulin  dependent type 2 diabetes mellitus ([name])     Current Outpatient Medications:    acetaminophen  (TYLENOL ) 500 MG tablet, Take 1,000 mg by mouth every 6 (six) hours as needed for headache (pain)., Disp: , Rfl:    albuterol  (VENTOLIN  HFA) 108 (90 Base) MCG/ACT inhaler, USE 2 PUFFS BY MOUTH EVERY FOUR HOURS, AS NEEDED, FOR COUGHING/WHEEZING, Disp: 8.5 g, Rfl: 0   allopurinol  (ZYLOPRIM ) 100 MG tablet, Take 150 mg by mouth daily., Disp: , Rfl:    amiodarone  (PACERONE ) 200 MG tablet, Take 0.5 tablets (100 mg total) by mouth daily., Disp: 30 tablet, Rfl: 1   budesonide -formoterol  (SYMBICORT ) 160-4.5 MCG/ACT inhaler, Inhale 2 puffs into the lungs 2 (two) times daily., Disp: 1 each, Rfl: 2   buPROPion  (WELLBUTRIN  XL) 300 MG 24 hr tablet, Take 300 mg by mouth every morning., Disp: , Rfl:    Cholecalciferol  (VITAMIN D3) 50 MCG (2000 UT) capsule, Take 1 capsule by mouth every morning., Disp: , Rfl:    colchicine  0.6 MG tablet, Take 1.2 mg by mouth See admin instructions. Take 1.2 mg for flair up and an hour later take 0.6 mg if needed, Disp: , Rfl:    Continuous Glucose Sensor (FREESTYLE LIBRE 3 PLUS SENSOR) MISC, as directed., Disp: , Rfl:    ELIQUIS  5 MG TABS tablet, TAKE 1 TABLET (5 MG TOTAL) BY MOUTH 2 (TWO) TIMES DAILY., Disp: 60 tablet, Rfl: 6   folic acid  (FOLVITE ) 1 MG tablet, Take 1 tablet (1 mg total) by mouth daily., Disp: 30 tablet, Rfl: 0   insulin  glargine (LANTUS ) 100 UNIT/ML injection, Inject 58 Units into the skin at bedtime., Disp: , Rfl:    insulin  lispro (HUMALOG ) 100 UNIT/ML KwikPen, Inject 15 Units into the skin 3 (three) times daily with meals. Plus sliding scale, max dose 70 units /day (Patient taking differently: Inject 10 Units into the skin 3 (three) times daily with meals. Plus sliding scale, max dose 70 units /day), Disp: 25 mL, Rfl: 1   isosorbide -hydrALAZINE  (BIDIL ) 20-37.5 MG tablet, Take 1 tablet by mouth 3 (three) times daily., Disp: 270 tablet, Rfl:  3   levothyroxine  (SYNTHROID ) 50 MCG tablet, TAKE 1 TABLET (50 MCG TOTAL) BY MOUTH DAILY BEFORE BREAKFAST (AM), Disp: 30 tablet, Rfl: 0   metoprolol  succinate (TOPROL -XL) 50 MG 24 hr tablet, Take 1 tablet (50 mg total) by mouth daily., Disp: 90 tablet, Rfl: 3   Omega-3 Fatty Acids (FISH OIL PO), Take 1 tablet by mouth daily., Disp: , Rfl:    pantoprazole  (PROTONIX ) 40 MG tablet, TAKE 1 TABLET (40 MG TOTAL) BY MOUTH DAILY(AM), Disp: 30 tablet, Rfl: 0   polyethylene glycol (MIRALAX  / GLYCOLAX ) 17 g packet, Take 17 g by mouth daily as needed for moderate constipation., Disp: 14 each, Rfl: 0   potassium chloride  SA (KLOR-CON  M) 20 MEQ tablet, Take 2 tablets (40 mEq total) by mouth every morning AND 1 tablet (20 mEq total) every evening., Disp: 200 tablet, Rfl: 3   rosuvastatin  (CRESTOR ) 40 MG tablet, Take 1 tablet (40 mg total) by mouth daily., Disp: 90 tablet, Rfl: 3   tirzepatide  (  MOUNJARO ) 7.5 MG/0.5ML Pen, Inject 7.5 mg into the skin once a week. (Patient taking differently: Inject 1 mg into the skin once a week.), Disp: 6 mL, Rfl: 1   torsemide  (DEMADEX ) 20 MG tablet, Take 3 tablets (60 mg total) by mouth 2 (two) times daily., Disp: 200 tablet, Rfl: 3   Continuous Glucose Receiver (DEXCOM G7 RECEIVER) DEVI, 1 Device by Does not apply route continuous., Disp: 1 each, Rfl: 0   Continuous Glucose Sensor (DEXCOM G7 SENSOR) MISC, 1 Device by Does not apply route continuous., Disp: 9 each, Rfl: 3   docusate sodium  (COLACE) 100 MG capsule, Take 2 capsules (200 mg total) by mouth at bedtime., Disp: 10 capsule, Rfl: 0   erythromycin ophthalmic ointment, Place 1 Application into the right eye 2 (two) times daily., Disp: , Rfl:    fluticasone  (FLONASE) 50 MCG/ACT nasal spray, Place 1 spray into both nostrils as needed for allergies or rhinitis., Disp: , Rfl:    gabapentin  (NEURONTIN ) 100 MG capsule, Take 1 capsule (100 mg total) by mouth 3 (three) times daily., Disp: 270 capsule, Rfl: 1   Glucagon  (BAQSIMI  ONE  PACK) 3 MG/DOSE POWD, Place 1 Device into the nose as needed (Low blood sugar with impaired consciousness)., Disp: 2 each, Rfl: 3   Menthol -Camphor (ICY HOT PRO NO MESS EX), Apply 1 Application topically 3 (three) times daily as needed (arms, neuropathy lower extrimity, gout in hand.)., Disp: , Rfl:    metolazone  (ZAROXOLYN ) 2.5 MG tablet, Take 2.5 mg by mouth as needed., Disp: , Rfl:    metolazone  (ZAROXOLYN ) 2.5 MG tablet, Take 1 tablet (2.5 mg total) by mouth every other day for 3 doses., Disp: 3 tablet, Rfl: 0   NYAMYC  powder, Apply 1 Application topically 2 (two) times daily., Disp: , Rfl:    nystatin  cream (MYCOSTATIN ), Apply 1 Application topically 2 (two) times daily as needed for dry skin., Disp: , Rfl:    triamcinolone  ointment (KENALOG ) 0.1 %, Apply topically 2 (two) times daily., Disp: 454 g, Rfl: 0   VITAMIN E PO, Take 1 tablet by mouth daily., Disp: , Rfl:  Allergies  Allergen Reactions   Neurontin  [Gabapentin ] Nausea And Vomiting and Other (See Comments)    POSSIBLE SHAKING   Lyrica  [Pregabalin ] Other (See Comments)    Shaking    Other     Neurontin        Social History   Socioeconomic History   Marital status: Single    Spouse name: Not on file   Number of children: 1   Years of education: 12   Highest education level: 12th grade  Occupational History   Occupation: disabled  Tobacco Use   Smoking status: Every Day    Current packs/day: 1.00    Average packs/day: 1 pack/day for 44.0 years (44.0 ttl pk-yrs)    Types: E-cigarettes, Cigarettes   Smokeless tobacco: Former    Types: Snuff  Vaping Use   Vaping status: Former   Devices: 11/26/2018 stopped months ago  Substance and Sexual Activity   Alcohol use: Not Currently    Comment: occ   Drug use: Yes    Types: Crack cocaine, Marijuana, Oxycodone    Sexual activity: Not Currently  Other Topics Concern   Not on file  Social History Narrative   ** Merged History Encounter **       Lives in [place], in  motel with sister.  They are looking to move but don't have a place to go yet.  Social Drivers of Health   Financial Resource Strain: Low Risk  (02/09/2023)   Overall Financial Resource Strain (CARDIA)    Difficulty of Paying Living Expenses: Not hard at all  Food Insecurity: No Food Insecurity (03/09/2024)   Hunger Vital Sign    Worried About Running Out of Food in the Last Year: Never true    Ran Out of Food in the Last Year: Never true  Transportation Needs: Unmet Transportation Needs (03/09/2024)   PRAPARE - Transportation    Lack of Transportation (Medical): Yes    Lack of Transportation (Non-Medical): Yes  Physical Activity: Inactive (02/09/2023)   Exercise Vital Sign    Days of Exercise per Week: 0 days    Minutes of Exercise per Session: 0 min  Stress: No Stress Concern Present (02/09/2023)   [employer] of Occupational Health - Occupational Stress Questionnaire    Feeling of Stress : Not at all  Social Connections: Unknown (08/21/2023)   [facility] from [facility]   Social Network    Social Network: Not on file  Intimate Partner Violence: Not At Risk (03/09/2024)   Humiliation, Afraid, Rape, and Kick questionnaire    Fear of Current or Ex-Partner: No    Emotionally Abused: No    Physically Abused: No    Sexually Abused: No    Physical Exam      Future Appointments  Date Time Provider Department Center  09/11/2024 11:00 AM [facility] None  09/12/2024  2:30 PM [facility] PA/NP [facility] None  10/31/2024 10:30 AM Aniceto Daphne CROME, NP [facility] H&V       [name], Paramedic [phone] [facility] Paramedic  09/09/24

## 2024-09-11 ENCOUNTER — Telehealth (HOSPITAL_COMMUNITY): Payer: Self-pay

## 2024-09-11 ENCOUNTER — Inpatient Hospital Stay (HOSPITAL_COMMUNITY)
Admission: RE | Admit: 2024-09-11 | Discharge: 2024-09-11 | Disposition: A | Source: Ambulatory Visit | Attending: Internal Medicine | Admitting: Internal Medicine

## 2024-09-11 VITALS — BP 127/64 | HR 61 | Temp 97.4°F | Resp 16

## 2024-09-11 DIAGNOSIS — N183 Chronic kidney disease, stage 3 unspecified: Secondary | ICD-10-CM | POA: Diagnosis not present

## 2024-09-11 MED ORDER — IRON SUCROSE 300 MG IVPB - SIMPLE MED
300.0000 mg | Freq: Once | Status: AC
  Administered 2024-09-11: 300 mg via INTRAVENOUS
  Filled 2024-09-11: qty 300

## 2024-09-11 NOTE — Telephone Encounter (Signed)
 Called to confirm/remind patient of their appointment at the [facility] on 09/12/24.   Appointment:   [] Confirmed  [x] Left mess   [] No answer/No voice mail  [] VM Full/unable to leave message  [] Phone not in service  And to bring in all medications and/or complete list.

## 2024-09-12 ENCOUNTER — Ambulatory Visit (HOSPITAL_COMMUNITY)
Admission: RE | Admit: 2024-09-12 | Discharge: 2024-09-12 | Disposition: A | Discharge status: Home / Self Care | Source: Ambulatory Visit | Attending: Physician Assistant | Admitting: Physician Assistant

## 2024-09-12 ENCOUNTER — Encounter (HOSPITAL_COMMUNITY): Payer: Self-pay

## 2024-09-12 VITALS — BP 120/74 | HR 66 | Ht 63.0 in | Wt 218.8 lb

## 2024-09-12 DIAGNOSIS — I6523 Occlusion and stenosis of bilateral carotid arteries: Secondary | ICD-10-CM | POA: Insufficient documentation

## 2024-09-12 DIAGNOSIS — I480 Paroxysmal atrial fibrillation: Secondary | ICD-10-CM | POA: Insufficient documentation

## 2024-09-12 DIAGNOSIS — Z89431 Acquired absence of right foot: Secondary | ICD-10-CM | POA: Diagnosis not present

## 2024-09-12 DIAGNOSIS — Z794 Long term (current) use of insulin: Secondary | ICD-10-CM | POA: Diagnosis not present

## 2024-09-12 DIAGNOSIS — E1122 Type 2 diabetes mellitus with diabetic chronic kidney disease: Secondary | ICD-10-CM | POA: Insufficient documentation

## 2024-09-12 DIAGNOSIS — Z7901 Long term (current) use of anticoagulants: Secondary | ICD-10-CM | POA: Diagnosis not present

## 2024-09-12 DIAGNOSIS — Z720 Tobacco use: Secondary | ICD-10-CM | POA: Diagnosis not present

## 2024-09-12 DIAGNOSIS — I5022 Chronic systolic (congestive) heart failure: Secondary | ICD-10-CM | POA: Diagnosis present

## 2024-09-12 DIAGNOSIS — Z95820 Peripheral vascular angioplasty status with implants and grafts: Secondary | ICD-10-CM | POA: Diagnosis not present

## 2024-09-12 DIAGNOSIS — Z7985 Long-term (current) use of injectable non-insulin antidiabetic drugs: Secondary | ICD-10-CM | POA: Insufficient documentation

## 2024-09-12 DIAGNOSIS — Z6838 Body mass index (BMI) 38.0-38.9, adult: Secondary | ICD-10-CM | POA: Insufficient documentation

## 2024-09-12 DIAGNOSIS — Z89422 Acquired absence of other left toe(s): Secondary | ICD-10-CM | POA: Diagnosis not present

## 2024-09-12 DIAGNOSIS — N184 Chronic kidney disease, stage 4 (severe): Secondary | ICD-10-CM | POA: Diagnosis not present

## 2024-09-12 DIAGNOSIS — I2510 Atherosclerotic heart disease of native coronary artery without angina pectoris: Secondary | ICD-10-CM | POA: Insufficient documentation

## 2024-09-12 DIAGNOSIS — R001 Bradycardia, unspecified: Secondary | ICD-10-CM | POA: Diagnosis not present

## 2024-09-12 DIAGNOSIS — I739 Peripheral vascular disease, unspecified: Secondary | ICD-10-CM | POA: Insufficient documentation

## 2024-09-12 DIAGNOSIS — I130 Hypertensive heart and chronic kidney disease with heart failure and stage 1 through stage 4 chronic kidney disease, or unspecified chronic kidney disease: Secondary | ICD-10-CM | POA: Insufficient documentation

## 2024-09-12 DIAGNOSIS — E669 Obesity, unspecified: Secondary | ICD-10-CM | POA: Diagnosis not present

## 2024-09-12 LAB — BASIC METABOLIC PANEL WITH GFR
Anion gap: 12 (ref 5–15)
BUN: 71 mg/dL — ABNORMAL HIGH (ref 8–23)
CO2: 24 mmol/L (ref 22–32)
Calcium: 8.7 mg/dL — ABNORMAL LOW (ref 8.9–10.3)
Chloride: 103 mmol/L (ref 98–111)
Creatinine, Ser: 2.92 mg/dL — ABNORMAL HIGH (ref 0.44–1.00)
GFR, Estimated: 18 mL/min — ABNORMAL LOW (ref >60)
Glucose, Bld: 189 mg/dL — ABNORMAL HIGH (ref 70–99)
Potassium: 4.2 mmol/L (ref 3.5–5.1)
Sodium: 139 mmol/L (ref 135–145)

## 2024-09-12 NOTE — Patient Instructions (Signed)
 No change in medications. Labs today - will call you if abnormal. Return to [facility] in 2 months - see below. Please call us  at [phone] if any questions or concerns prior to your next appointment.

## 2024-09-12 NOTE — Progress Notes (Addendum)
 Patient ID: [name], female   DOB: 1961-04-26, 63 y.o.   MRN: [id number]    Advanced Heart Failure Clinic Note   PCP: [facility] PV: Dr. Court HF Cardiology: Dr. Rolan  Chief complaint: CHF  HPI: [name] is a 63 y.o. with history of PAD, carotid stenosis s/p left CEA, CAD, chronic systolic CHF, paroxysmal atrial fibrillation, bradycardia, and prior substance abuse.   In 5/18, she had peripheral angiography with atherectomy of right SFA.  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 In 8/20, she had peripheral angiography showing totally occluded left SFA.  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 Oris.    Echo 1/21 EF 50%, mild LVH, normal RV, PASP 60 mmHg.  Echo 4/22 EF 55-60% with basal to mid inferolateral hypokinesis.   Underwent PV angiogram with Dr. Court 7/22. She had drug-coated balloon angioplasty of the right SFA after being admitted for critical ischemia of the right foot.  Repeat peripheral arterial dopplers in 8/22 still showed severe right SFA disease.   Echo 4/24 EF 50-55%, RV normal.  Admitted 9/24 with UTI/sepsis. Required ICU care, BiPap and abx. Underwent RLE angiogram with femoropopliteal angioplasty and stenting.  Two admissions in 11/24 for AF/RVR s/p DCCV. EF was 45-50%, nl RV. She was d/c'd on Torsemide  100 bid w/o weekly metolazone  and readmitted for volume overload. She was d/c'd with prn Metolazone , however CardioMems reading (PA 31) suggested she was not diuresed at discharge. Was instructed to use Furosicx at home. At follow up PA 36, she was switched back to Torsemide  with metolazone .  Admitted 4/25 for GIB and ABLA. Hgb 5.3 on admission. Transfused. Eliquis  held. Underwent EGD: Esophageal plaques were found consistent with candidiasis.  Normal stomach. Started on fluconazole .   Normal examined duodenal. No source of bleeding on endoscopy, underwent colonoscopy,  multiples polyps found and had polypectomy. GI recommended capsule endoscopy in the future if recurrence of bleeding. Resumed Eliquis .   Echo in 4/25 showed EF 35-40%, mild RV dysfunction, PASP 56, moderate MR.   She was volume overloaded at f/u a few weeks ago and dose of Torsemide  was increased.   She is here today for CHF follow-up. Clinic weight down 9 lb from last visit. Feels better but can't explain what has improved. No shortness of breath, orthopnea or PND. Has chronic R>L lower extremity edema. Tries to limit sodium and fluid intake. Uses a walker in the house. She is not very mobile. Mostly limited by knee and back pain, gets neuropathy type pains  in her feet.   Continues to smoke 1/2 ppd  Cardiomems goal 20, up to 34 on 10/15 and down to 23 on 10/28   Labs (1/24): K 4.0, creatinine 3.58 Labs (9/24): K 3.8, creatinine 1.44 Labs (11/24): K 4, creatinine 1.59 Labs (11/24): 4.2, creatinine 1.28  Labs (5/25): K 3.7, creatinine 1.78 Labs (6/25): K 5.0, creatinine 2.45, normal TSH, normal LFTs Labs (9/25): K 4, creatinine 2.7   PMH: 1. Carotid stenosis: Known occluded right carotid.  Left CEA in 4/16.  - Carotid dopplers (7/21): CTO RICA, mild disease LICA.  - Carotid dopplers (7/22): CTO RICA, 1-39% LICA.  - Carotid dopplers (3/23): Totally occluded RICA, 1-39% LICA.  2. CAD: LHC in 12/15 with 80% stenosis in small OM1, nonobstructive disease in other territories.  3. Chronic systolic CHF: Nonischemic cardiomyopathy (?due to ETOH or prior drug abuse).  She has  a Cardiomems.  - Echo (1/17) with EF 45%, mild LV hypertrophy, moderate diastolic dysfunction, inferolateral severe hypokinesis, mildly decreased RV systolic function.  - Echo (1/18): EF 40-45%, mild LVH, normal RV size and systolic function.  - Echo (1/21): EF 50%, mild LVH, normal RV, PASP 60 mmHg.  - Echo (4/22): EF 55-60%, basal-mid inferolateral hypokinesis with grade II diastolic dysfunction, RV normal, PASP 42 -  Echo (4/24): EF 50-55%.  - Echo (11/24): EF 45-50%, normal RV, mild MR, anterolateral akinesis. - Echo (4/25): EF 35-40%, mild RV dysfunction, PASP 56, moderate MR.  4. Atrial fibrillation: Paroxysmal.   - DCCV 11/24 5. Type II diabetes 6. CKD: Stage III-IV. 7. COPD: Smokes 1/4-1/2 ppd.  8. 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  No longer drinks.  9. Hypothyroidism 10. PAD: Atherectomy SFA in 2014 (Dr Court).  Peripheral arterial dopplers (2/17) with focal 75-99% proximal right SFA stenosis, occluded mid-distal right SFA, chronic occlusion of all runoff arteries on right. - In 5/18, she had peripheral angiography with atherectomy of right SFA.   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  - 7/18 ABIs showed 0.65 on right, 0.31 on left.   - Peripheral angiography 8/20: Totally occluded left SFA.  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 Oris.   - ABIs (2/21): 0.44 R, 0.99 L.  - Balloon angioplasty right SFA 7/22.  - ABIs (8/22): severe right SFA disease.  - peripheral arterial dopplers (2/23): 50-74% right SFA and right popliteal. Stable.  - s/p RLE angiogram with femoropopliteal angioplasty and stenting 9/24 - ABIs (11/24): 0.6 on right, 1.03 on left.  11. Anemia: Fe deficiency.  12. Prior cocaine abuse.  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  14. OSA: Has not been able to tolerate CPAP.  15. Diabetic neuropathy.    SH: Lives with sister.  Prior cocaine abuse.  Prior ETOH abuse.  Quit smoking in 1/17, restarted, and quit again in 4/19, restarted again.   FH: CAD  Review of systems complete and found to be negative unless listed in HPI.    Current Outpatient Medications  Medication Sig Dispense Refill   acetaminophen  (TYLENOL ) 500 MG tablet Take 1,000 mg by mouth every 6 (six) hours as needed for headache (pain).     albuterol  (VENTOLIN  HFA) 108 (90 Base) MCG/ACT inhaler  USE 2 PUFFS BY MOUTH EVERY FOUR HOURS, AS NEEDED, FOR COUGHING/WHEEZING 8.5 g 0   allopurinol  (ZYLOPRIM ) 100 MG tablet Take 150 mg by mouth daily.     amiodarone  (PACERONE ) 200 MG tablet Take 0.5 tablets (100 mg total) by mouth daily. 30 tablet 1   budesonide -formoterol  (SYMBICORT ) 160-4.5 MCG/ACT inhaler Inhale 2 puffs into the lungs 2 (two) times daily. 1 each 2   buPROPion  (WELLBUTRIN  XL) 300 MG 24 hr tablet Take 300 mg by mouth every morning.     Cholecalciferol  (VITAMIN D3) 50 MCG (2000 UT) capsule Take 1 capsule by mouth every morning.     colchicine  0.6 MG tablet Take 1.2 mg by mouth See admin instructions. Take 1.2 mg for flair up and an hour later take 0.6 mg if needed     Continuous Glucose Receiver (DEXCOM G7 RECEIVER) DEVI 1 Device by Does not apply route continuous. 1 each 0   Continuous Glucose Sensor (DEXCOM G7 SENSOR) MISC 1 Device by Does not apply route continuous. 9 each 3   Continuous Glucose Sensor (FREESTYLE  LIBRE 3 PLUS SENSOR) MISC as directed.     docusate sodium  (COLACE) 100 MG capsule Take 2 capsules (200 mg total) by mouth at bedtime. 10 capsule 0   ELIQUIS  5 MG TABS tablet TAKE 1 TABLET (5 MG TOTAL) BY MOUTH 2 (TWO) TIMES DAILY. 60 tablet 6   erythromycin ophthalmic ointment Place 1 Application into the right eye 2 (two) times daily.     fluticasone  (FLONASE) 50 MCG/ACT nasal spray Place 1 spray into both nostrils as needed for allergies or rhinitis.     folic acid  (FOLVITE ) 1 MG tablet Take 1 tablet (1 mg total) by mouth daily. 30 tablet 0   gabapentin  (NEURONTIN ) 100 MG capsule Take 1 capsule (100 mg total) by mouth 3 (three) times daily. 270 capsule 1   Glucagon  (BAQSIMI  ONE PACK) 3 MG/DOSE POWD Place 1 Device into the nose as needed (Low blood sugar with impaired consciousness). 2 each 3   insulin  glargine (LANTUS ) 100 UNIT/ML injection Inject 58 Units into the skin at bedtime.     insulin  lispro (HUMALOG ) 100 UNIT/ML KwikPen Inject 15 Units into the skin 3 (three)  times daily with meals. Plus sliding scale, max dose 70 units /day (Patient taking differently: Inject 10 Units into the skin 3 (three) times daily with meals. Plus sliding scale, max dose 70 units /day) 25 mL 1   isosorbide -hydrALAZINE  (BIDIL ) 20-37.5 MG tablet Take 1 tablet by mouth 3 (three) times daily. 270 tablet 3   levothyroxine  (SYNTHROID ) 50 MCG tablet TAKE 1 TABLET (50 MCG TOTAL) BY MOUTH DAILY BEFORE BREAKFAST (AM) 30 tablet 0   Menthol -Camphor (ICY HOT PRO NO MESS EX) Apply 1 Application topically 3 (three) times daily as needed (arms, neuropathy lower extrimity, gout in hand.).     metolazone  (ZAROXOLYN ) 2.5 MG tablet Take 2.5 mg by mouth as needed.     metolazone  (ZAROXOLYN ) 2.5 MG tablet Take 1 tablet (2.5 mg total) by mouth every other day for 3 doses. 3 tablet 0   metoprolol  succinate (TOPROL -XL) 50 MG 24 hr tablet Take 1 tablet (50 mg total) by mouth daily. 90 tablet 3   NYAMYC  powder Apply 1 Application topically 2 (two) times daily.     nystatin  cream (MYCOSTATIN ) Apply 1 Application topically 2 (two) times daily as needed for dry skin.     Omega-3 Fatty Acids (FISH OIL PO) Take 1 tablet by mouth daily.     pantoprazole  (PROTONIX ) 40 MG tablet TAKE 1 TABLET (40 MG TOTAL) BY MOUTH DAILY(AM) 30 tablet 0   polyethylene glycol (MIRALAX  / GLYCOLAX ) 17 g packet Take 17 g by mouth daily as needed for moderate constipation. 14 each 0   potassium chloride  SA (KLOR-CON  M) 20 MEQ tablet Take 2 tablets (40 mEq total) by mouth every morning AND 1 tablet (20 mEq total) every evening. 200 tablet 3   rosuvastatin  (CRESTOR ) 40 MG tablet Take 1 tablet (40 mg total) by mouth daily. 90 tablet 3   tirzepatide  (MOUNJARO ) 7.5 MG/0.5ML Pen Inject 7.5 mg into the skin once a week. 6 mL 1   torsemide  (DEMADEX ) 20 MG tablet Take 3 tablets (60 mg total) by mouth 2 (two) times daily. 200 tablet 3   triamcinolone  ointment (KENALOG ) 0.1 % Apply topically 2 (two) times daily. 454 g 0   VITAMIN E PO Take 1  tablet by mouth daily.     No current facility-administered medications for this encounter.   BP 120/74   Pulse 66   Ht 5' 3 (  1.6 m)   Wt 99.2 kg (218 lb 12.8 oz)   LMP  (LMP Unknown)   SpO2 97%   BMI 38.76 kg/m   Wt Readings from Last 3 Encounters:  09/12/24 99.2 kg (218 lb 12.8 oz)  09/09/24 95.7 kg (211 lb)  09/02/24 98 kg (216 lb)   PHYSICAL EXAM: General:  Arrived in wheelchair. Elderly appearing. Cor: No JVD. Regular rate & rhythm. No murmurs. Lungs: diminished Abdomen: soft, nontender, nondistended. Extremities: trace edema on left, 1 + on right Neuro: alert & orientedx3. Affect pleasant   Assessment/Plan: 1. Chronic systolic CHF: LHC in 12/15 showing only 80% stenosis in small OM1. EF as low as 40-45% in the past. Echo 4/22 showed EF 55-60% with grade 2 diastolic dysfunction.  Echo (4/24) EF 50-55%.  Echo in 11/24 with EF 45-50%, normal RV, mild MR, anterolateral akinesis.  Echo in 4/25 showed EF 35-40%, mild RV dysfunction, PASP 56, moderate MR.  NYHA class III, exertion limited by back and knee pain. GDMT limited by cardio-renal syndrome.   - Volume improved with increasing Torsemide  to 60 mg BID. PAd trending down on Cardiomems, 23 on 10/28 (goal 20). - Check BMET/BNP today - Continue BiDil  1 tab tid - Continue Toprol  XL 50 mg daily.  - Will not lower BP too aggressively with advanced CKD - Off Jardiance  due to severe UTIs. - Followed by Paramedicine in the community. Appreciate assistance. Seems to be helping with adherence. 2. CKD IV:  baseline SCr > 2 since 11/2023.  In the setting of cardio-renal disease and poorly controlled T2DM. - Off spironolactone  and ARB with creatinine rise.  - Scr up to 2.7 after diuretics increased. Repeat labs today. - Followed by nephrology.  3. Atrial fibrillation: Paroxysmal.  In ectopic atrial rhythm at last f/u. Regular on exam today. - Continue Eliquis  5 mg bid. - Continue amiodarone  100 mg daily. TSH and LFTs stable 10/25. She  will need regular eye exam while on amiodarone . 4. PAD: Not candidate for cilostazol with CHF.  She has had a left fem-pop bypass in 8/20. She underwent PV angiogram with balloon angioplasty of the R SFA 7/22. S/p RLE fem-pop angioplasty and stenting in 9/24. Hx R TMA. - Continue rosuvastatin .  - She is on apixaban  so no ASA.  5. COPD:  Smoking 0.5 pack/day.  - Discussed cessation. Does not appear ready to quit. 6. HTN: BP controlled.  7.Type 2 diabetes: Poorly controlled historically.  - Now follows with Endocrine. - Insulin  dependent. Also on mounjaro . 8. CAD: LHC in 12/15 with 80% stenosis in small OM1, nonobstructive disease in other territories. No chest pain. - Continue rosuvastatin  + Vascepa  - Not on ASA with need for Eliquis . 9. Carotid stenosis: Stable 4/24 dopplers.  Followed by [facility].  10. Obesity: Body mass index is 38.76 kg/m. - On Mounjaro   Follow up 2 months with APP   Guillaume Weninger N, PA-C 09/12/2024

## 2024-09-13 ENCOUNTER — Ambulatory Visit (HOSPITAL_COMMUNITY): Payer: Self-pay | Admitting: Physician Assistant

## 2024-09-16 ENCOUNTER — Other Ambulatory Visit (HOSPITAL_COMMUNITY): Payer: Self-pay

## 2024-09-16 MED ORDER — TORSEMIDE 20 MG PO TABS: ORAL_TABLET | ORAL | Status: DC

## 2024-09-16 NOTE — Progress Notes (Signed)
 Paramedicine Encounter    Patient ID: [name], female    DOB: June 27, 1961, 63 y.o.   MRN: [id number]   Pt is gone out of town today but said nephew was home and would let me inside so I can fill her pill box. When I arrived I knocked loudly several times and she tried calling but he didn't answer the phone so I left.  I will be riding on the ambulance tomor but should be able to make it by there tomor to get this done.    Patient Care Team: Campbell Reynolds, NP as PCP - General Court Dorn PARAS, MD as PCP - Cardiology (Cardiology) Rolan Ezra GORMAN, MD as PCP - Advanced Heart Failure (Cardiology) Inocencio Soyla Lunger, MD as PCP - Electrophysiology (Cardiology) Court Dorn PARAS, MD as Consulting Physician (Cardiology) Darlean Ozell NOVAK, MD as Consulting Physician (Pulmonary Disease) System, Provider Not In  Patient Active Problem List   Diagnosis Date Noted   Anemia of chronic renal failure 08/06/2024   Persistent atrial fibrillation ([name]) 03/15/2024   Benign neoplasm of ascending colon 03/14/2024   Benign neoplasm of transverse colon 03/14/2024   Benign neoplasm of descending colon 03/14/2024   Benign neoplasm of sigmoid colon 03/14/2024   Chronic systolic congestive heart failure ([name]) 03/12/2024   Melena 03/12/2024   Heme positive stool 03/11/2024   GI bleed 03/08/2024   History of CAD (coronary artery disease) 03/08/2024   CKD (chronic kidney disease), stage IV ([name]) 03/08/2024   Continuous dependence on cigarette smoking 03/08/2024   GAD (generalized anxiety disorder) 03/08/2024   HFrEF (heart failure with reduced ejection fraction) ([name]) 10/11/2023   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) and diastolic (congestive) heart failure ([name]) 10/10/2023   Pre-ulcerative calluses 04/25/2022   PAF (paroxysmal atrial fibrillation) ([name]) 05/05/2021   COPD (chronic obstructive pulmonary disease) ([name]) 05/05/2021   OSA (obstructive sleep apnea) 05/05/2021   Stage 3b chronic  kidney disease ([name]) 05/05/2021   Pressure injury of skin 05/04/2021   Diabetic nephropathy ([name]) 01/02/2021   Low back pain 06/01/2020   Hyperlipidemia 04/03/2020   Peripheral artery disease 12/26/2019   NICM (nonischemic cardiomyopathy) ([name]) 06/20/2019   Non-healing ulcer ([name]) 06/20/2019   Coagulation disorder 08/09/2017   Depression 07/21/2017   At risk for adverse drug reaction 06/20/2017   Peripheral neuropathy 06/20/2017   S/P transmetatarsal amputation of foot, right ([name]) 06/05/2017   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) 05/19/2017   Obesity, class 2 02/24/2016   Anticoagulation management encounter 02/10/2016   Chronic sinus bradycardia 01/12/2016   Essential hypertension 12/22/2015   Demand ischemia ([name])    Acute on chronic diastolic CHF (congestive heart failure) ([name])    Symptomatic anemia 11/08/2015   Hypokalemia 11/08/2015   Tobacco abuse 10/23/2015   Coronary artery disease    DOE (dyspnea on exertion) 04/29/2015   Paroxysmal atrial fibrillation ([name]) 01/16/2015   Carotid artery stenosis 01/16/2015   Insomnia 02/03/2014   S/P peripheral artery angioplasty - TurboHawk atherectomy; R SFA 09/11/2013    Class: Acute   Leg pain, bilateral 08/19/2013   Hypothyroidism 07/31/2013   History of cocaine abuse ([name]) 06/13/2013   Long term current use of anticoagulant therapy 05/20/2013   Alcohol abuse    Narcotic abuse ([name])    Marijuana abuse    Alcoholic cirrhosis ([name])    Insulin  dependent type 2 diabetes mellitus ([name])     Current Outpatient Medications:    acetaminophen  (TYLENOL ) 500  MG tablet, Take 1,000 mg by mouth every 6 (six) hours as needed for headache (pain)., Disp: , Rfl:    albuterol  (VENTOLIN  HFA) 108 (90 Base) MCG/ACT inhaler, USE 2 PUFFS BY MOUTH EVERY FOUR HOURS, AS NEEDED, FOR COUGHING/WHEEZING, Disp: 8.5 g, Rfl: 0   allopurinol  (ZYLOPRIM ) 100 MG tablet, Take 150 mg by mouth daily.,  Disp: , Rfl:    amiodarone  (PACERONE ) 200 MG tablet, Take 0.5 tablets (100 mg total) by mouth daily., Disp: 30 tablet, Rfl: 1   budesonide -formoterol  (SYMBICORT ) 160-4.5 MCG/ACT inhaler, Inhale 2 puffs into the lungs 2 (two) times daily., Disp: 1 each, Rfl: 2   buPROPion  (WELLBUTRIN  XL) 300 MG 24 hr tablet, Take 300 mg by mouth every morning., Disp: , Rfl:    Cholecalciferol  (VITAMIN D3) 50 MCG (2000 UT) capsule, Take 1 capsule by mouth every morning., Disp: , Rfl:    colchicine  0.6 MG tablet, Take 1.2 mg by mouth See admin instructions. Take 1.2 mg for flair up and an hour later take 0.6 mg if needed, Disp: , Rfl:    Continuous Glucose Receiver (DEXCOM G7 RECEIVER) DEVI, 1 Device by Does not apply route continuous., Disp: 1 each, Rfl: 0   Continuous Glucose Sensor (DEXCOM G7 SENSOR) MISC, 1 Device by Does not apply route continuous., Disp: 9 each, Rfl: 3   Continuous Glucose Sensor (FREESTYLE LIBRE 3 PLUS SENSOR) MISC, as directed., Disp: , Rfl:    docusate sodium  (COLACE) 100 MG capsule, Take 2 capsules (200 mg total) by mouth at bedtime., Disp: 10 capsule, Rfl: 0   ELIQUIS  5 MG TABS tablet, TAKE 1 TABLET (5 MG TOTAL) BY MOUTH 2 (TWO) TIMES DAILY., Disp: 60 tablet, Rfl: 6   erythromycin ophthalmic ointment, Place 1 Application into the right eye 2 (two) times daily., Disp: , Rfl:    fluticasone  (FLONASE) 50 MCG/ACT nasal spray, Place 1 spray into both nostrils as needed for allergies or rhinitis., Disp: , Rfl:    folic acid  (FOLVITE ) 1 MG tablet, Take 1 tablet (1 mg total) by mouth daily., Disp: 30 tablet, Rfl: 0   gabapentin  (NEURONTIN ) 100 MG capsule, Take 1 capsule (100 mg total) by mouth 3 (three) times daily., Disp: 270 capsule, Rfl: 1   Glucagon  (BAQSIMI  ONE PACK) 3 MG/DOSE POWD, Place 1 Device into the nose as needed (Low blood sugar with impaired consciousness)., Disp: 2 each, Rfl: 3   insulin  glargine (LANTUS ) 100 UNIT/ML injection, Inject 58 Units into the skin at bedtime., Disp: , Rfl:     insulin  lispro (HUMALOG ) 100 UNIT/ML KwikPen, Inject 15 Units into the skin 3 (three) times daily with meals. Plus sliding scale, max dose 70 units /day (Patient taking differently: Inject 10 Units into the skin 3 (three) times daily with meals. Plus sliding scale, max dose 70 units /day), Disp: 25 mL, Rfl: 1   isosorbide -hydrALAZINE  (BIDIL ) 20-37.5 MG tablet, Take 1 tablet by mouth 3 (three) times daily., Disp: 270 tablet, Rfl: 3   levothyroxine  (SYNTHROID ) 50 MCG tablet, TAKE 1 TABLET (50 MCG TOTAL) BY MOUTH DAILY BEFORE BREAKFAST (AM), Disp: 30 tablet, Rfl: 0   Menthol -Camphor (ICY HOT PRO NO MESS EX), Apply 1 Application topically 3 (three) times daily as needed (arms, neuropathy lower extrimity, gout in hand.)., Disp: , Rfl:    metolazone  (ZAROXOLYN ) 2.5 MG tablet, Take 2.5 mg by mouth as needed., Disp: , Rfl:    metolazone  (ZAROXOLYN ) 2.5 MG tablet, Take 1 tablet (2.5 mg total) by mouth every other day for 3 doses.,  Disp: 3 tablet, Rfl: 0   metoprolol  succinate (TOPROL -XL) 50 MG 24 hr tablet, Take 1 tablet (50 mg total) by mouth daily., Disp: 90 tablet, Rfl: 3   NYAMYC  powder, Apply 1 Application topically 2 (two) times daily., Disp: , Rfl:    nystatin  cream (MYCOSTATIN ), Apply 1 Application topically 2 (two) times daily as needed for dry skin., Disp: , Rfl:    Omega-3 Fatty Acids (FISH OIL PO), Take 1 tablet by mouth daily., Disp: , Rfl:    pantoprazole  (PROTONIX ) 40 MG tablet, TAKE 1 TABLET (40 MG TOTAL) BY MOUTH DAILY(AM), Disp: 30 tablet, Rfl: 0   polyethylene glycol (MIRALAX  / GLYCOLAX ) 17 g packet, Take 17 g by mouth daily as needed for moderate constipation., Disp: 14 each, Rfl: 0   potassium chloride  SA (KLOR-CON  M) 20 MEQ tablet, Take 2 tablets (40 mEq total) by mouth every morning AND 1 tablet (20 mEq total) every evening., Disp: 200 tablet, Rfl: 3   rosuvastatin  (CRESTOR ) 40 MG tablet, Take 1 tablet (40 mg total) by mouth daily., Disp: 90 tablet, Rfl: 3   tirzepatide  (MOUNJARO ) 7.5  MG/0.5ML Pen, Inject 7.5 mg into the skin once a week., Disp: 6 mL, Rfl: 1   torsemide  (DEMADEX ) 20 MG tablet, Take 3 tablets (60 mg total) by mouth 2 (two) times daily., Disp: 200 tablet, Rfl: 3   triamcinolone  ointment (KENALOG ) 0.1 %, Apply topically 2 (two) times daily., Disp: 454 g, Rfl: 0   VITAMIN E PO, Take 1 tablet by mouth daily., Disp: , Rfl:  Allergies  Allergen Reactions   Neurontin  [Gabapentin ] Nausea And Vomiting and Other (See Comments)    POSSIBLE SHAKING   Lyrica  [Pregabalin ] Other (See Comments)    Shaking    Other     Neurontin        Social History   Socioeconomic History   Marital status: Single    Spouse name: Not on file   Number of children: 1   Years of education: 12   Highest education level: 12th grade  Occupational History   Occupation: disabled  Tobacco Use   Smoking status: Every Day    Current packs/day: 1.00    Average packs/day: 1 pack/day for 44.0 years (44.0 ttl pk-yrs)    Types: E-cigarettes, Cigarettes   Smokeless tobacco: Former    Types: Snuff  Vaping Use   Vaping status: Former   Devices: 11/26/2018 stopped months ago  Substance and Sexual Activity   Alcohol use: Not Currently    Comment: occ   Drug use: Yes    Types: Crack cocaine, Marijuana, Oxycodone    Sexual activity: Not Currently  Other Topics Concern   Not on file  Social History Narrative   ** Merged History Encounter **       Lives in [place], in motel with sister.  They are looking to move but don't have a place to go yet.     Social Drivers of Corporate Investment Banker Strain: Low Risk  (02/09/2023)   Overall Financial Resource Strain (CARDIA)    Difficulty of Paying Living Expenses: Not hard at all  Food Insecurity: No Food Insecurity (03/09/2024)   Hunger Vital Sign    Worried About Running Out of Food in the Last Year: Never true    Ran Out of Food in the Last Year: Never true  Transportation Needs: Unmet Transportation Needs (03/09/2024)   PRAPARE -  Administrator, Civil Service (Medical): Yes    Lack of Transportation (  Non-Medical): Yes  Physical Activity: Inactive (02/09/2023)   Exercise Vital Sign    Days of Exercise per Week: 0 days    Minutes of Exercise per Session: 0 min  Stress: No Stress Concern Present (02/09/2023)   [employer] of Occupational Health - Occupational Stress Questionnaire    Feeling of Stress : Not at all  Social Connections: Unknown (08/21/2023)   [facility] from [facility]   Social Network    Social Network: Not on file  Intimate Partner Violence: Not At Risk (03/09/2024)   Humiliation, Afraid, Rape, and Kick questionnaire    Fear of Current or Ex-Partner: No    Emotionally Abused: No    Physically Abused: No    Sexually Abused: No    Physical Exam      Future Appointments  Date Time Provider Department Center  10/31/2024 10:55 AM Aniceto Daphne CROME, NP [facility]  11/15/2024  3:00 PM [facility] PA/NP [facility] None       [name], Paramedic [phone] [facility] Paramedic  09/16/24

## 2024-09-17 ENCOUNTER — Other Ambulatory Visit (HOSPITAL_COMMUNITY): Payer: Self-pay

## 2024-09-17 NOTE — Progress Notes (Signed)
 Paramedicine Encounter    Patient ID: [name], female    DOB: 1960/11/18, 63 y.o.   MRN: [id number]   Complaints-arm pain   Edema-none   Compliance with meds-yes  Pill box filled-yes If so, by whom-paramedic   Refills needed- Eliquis  Mounjaro   Ordered these refills.    Pt got back from out of town trip last night around 9pm and nephew didn't answer door yesterday when I came by so home visit was made today.  Pt denies increased sob, no dizziness, no c/p. Legs look better.  Missed her meds last night due to as noted above.   Meds verified and pill box refilled.  She was finishing her up cardiomems when I arrived.  Goal is 20-hers is at 22 today. Wheezing all lobes      LMP  (LMP Unknown)  Weight yesterday-? Last visit weight-218 @ clinic   Patient Care Team: Campbell Reynolds, NP as PCP - General Karla Dorn PARAS, MD as PCP - Cardiology (Cardiology) Karla Ezra GORMAN, MD as PCP - Advanced Heart Failure (Cardiology) Karla Soyla Lunger, MD as PCP - Electrophysiology (Cardiology) Karla Dorn PARAS, MD as Consulting Physician (Cardiology) Karla Ozell NOVAK, MD as Consulting Physician (Pulmonary Disease) System, Provider Not In  Patient Active Problem List   Diagnosis Date Noted   Anemia of chronic renal failure 08/06/2024   Persistent atrial fibrillation ([name]) 03/15/2024   Benign neoplasm of ascending colon 03/14/2024   Benign neoplasm of transverse colon 03/14/2024   Benign neoplasm of descending colon 03/14/2024   Benign neoplasm of sigmoid colon 03/14/2024   Chronic systolic congestive heart failure ([name]) 03/12/2024   Melena 03/12/2024   Heme positive stool 03/11/2024   GI bleed 03/08/2024   History of CAD (coronary artery disease) 03/08/2024   CKD (chronic kidney disease), stage IV ([name]) 03/08/2024   Continuous dependence on cigarette smoking 03/08/2024   GAD (generalized anxiety disorder) 03/08/2024   HFrEF (heart failure with reduced ejection fraction)  ([name]) 10/11/2023   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) and diastolic (congestive) heart failure ([name]) 10/10/2023   Pre-ulcerative calluses 04/25/2022   PAF (paroxysmal atrial fibrillation) ([name]) 05/05/2021   COPD (chronic obstructive pulmonary disease) ([name]) 05/05/2021   OSA (obstructive sleep apnea) 05/05/2021   Stage 3b chronic kidney disease ([name]) 05/05/2021   Pressure injury of skin 05/04/2021   Diabetic nephropathy ([name]) 01/02/2021   Low back pain 06/01/2020   Hyperlipidemia 04/03/2020   Peripheral artery disease 12/26/2019   NICM (nonischemic cardiomyopathy) ([name]) 06/20/2019   Non-healing ulcer ([name]) 06/20/2019   Coagulation disorder 08/09/2017   Depression 07/21/2017   At risk for adverse drug reaction 06/20/2017   Peripheral neuropathy 06/20/2017   S/P transmetatarsal amputation of foot, right ([name]) 06/05/2017   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) 05/19/2017   Obesity, class 2 02/24/2016   Anticoagulation management encounter 02/10/2016   Chronic sinus bradycardia 01/12/2016   Essential hypertension 12/22/2015   Demand ischemia ([name])    Acute on chronic diastolic CHF (congestive heart failure) ([name])    Symptomatic anemia 11/08/2015   Hypokalemia 11/08/2015   Tobacco abuse 10/23/2015   Coronary artery disease    DOE (dyspnea on exertion) 04/29/2015   Paroxysmal atrial fibrillation ([name]) 01/16/2015   Carotid artery stenosis 01/16/2015   Insomnia 02/03/2014   S/P peripheral artery angioplasty - TurboHawk atherectomy; R SFA 09/11/2013    Class: Acute   Leg pain, bilateral 08/19/2013   Hypothyroidism 07/31/2013   History  of cocaine abuse ([name]) 06/13/2013   Long term current use of anticoagulant therapy 05/20/2013   Alcohol abuse    Narcotic abuse ([name])    Marijuana abuse    Alcoholic cirrhosis ([name])    Insulin  dependent type 2 diabetes mellitus ([name])     Current Outpatient Medications:     acetaminophen  (TYLENOL ) 500 MG tablet, Take 1,000 mg by mouth every 6 (six) hours as needed for headache (pain)., Disp: , Rfl:    albuterol  (VENTOLIN  HFA) 108 (90 Base) MCG/ACT inhaler, USE 2 PUFFS BY MOUTH EVERY FOUR HOURS, AS NEEDED, FOR COUGHING/WHEEZING, Disp: 8.5 g, Rfl: 0   allopurinol  (ZYLOPRIM ) 100 MG tablet, Take 150 mg by mouth daily., Disp: , Rfl:    amiodarone  (PACERONE ) 200 MG tablet, Take 0.5 tablets (100 mg total) by mouth daily., Disp: 30 tablet, Rfl: 1   budesonide -formoterol  (SYMBICORT ) 160-4.5 MCG/ACT inhaler, Inhale 2 puffs into the lungs 2 (two) times daily., Disp: 1 each, Rfl: 2   buPROPion  (WELLBUTRIN  XL) 300 MG 24 hr tablet, Take 300 mg by mouth every morning., Disp: , Rfl:    Cholecalciferol  (VITAMIN D3) 50 MCG (2000 UT) capsule, Take 1 capsule by mouth every morning., Disp: , Rfl:    colchicine  0.6 MG tablet, Take 1.2 mg by mouth See admin instructions. Take 1.2 mg for flair up and an hour later take 0.6 mg if needed, Disp: , Rfl:    Continuous Glucose Receiver (DEXCOM G7 RECEIVER) DEVI, 1 Device by Does not apply route continuous., Disp: 1 each, Rfl: 0   Continuous Glucose Sensor (DEXCOM G7 SENSOR) MISC, 1 Device by Does not apply route continuous., Disp: 9 each, Rfl: 3   Continuous Glucose Sensor (FREESTYLE LIBRE 3 PLUS SENSOR) MISC, as directed., Disp: , Rfl:    docusate sodium  (COLACE) 100 MG capsule, Take 2 capsules (200 mg total) by mouth at bedtime., Disp: 10 capsule, Rfl: 0   ELIQUIS  5 MG TABS tablet, TAKE 1 TABLET (5 MG TOTAL) BY MOUTH 2 (TWO) TIMES DAILY., Disp: 60 tablet, Rfl: 6   erythromycin ophthalmic ointment, Place 1 Application into the right eye 2 (two) times daily., Disp: , Rfl:    fluticasone  (FLONASE) 50 MCG/ACT nasal spray, Place 1 spray into both nostrils as needed for allergies or rhinitis., Disp: , Rfl:    folic acid  (FOLVITE ) 1 MG tablet, Take 1 tablet (1 mg total) by mouth daily., Disp: 30 tablet, Rfl: 0   gabapentin  (NEURONTIN ) 100 MG capsule,  Take 1 capsule (100 mg total) by mouth 3 (three) times daily., Disp: 270 capsule, Rfl: 1   Glucagon  (BAQSIMI  ONE PACK) 3 MG/DOSE POWD, Place 1 Device into the nose as needed (Low blood sugar with impaired consciousness)., Disp: 2 each, Rfl: 3   insulin  glargine (LANTUS ) 100 UNIT/ML injection, Inject 58 Units into the skin at bedtime., Disp: , Rfl:    insulin  lispro (HUMALOG ) 100 UNIT/ML KwikPen, Inject 15 Units into the skin 3 (three) times daily with meals. Plus sliding scale, max dose 70 units /day (Patient taking differently: Inject 10 Units into the skin 3 (three) times daily with meals. Plus sliding scale, max dose 70 units /day), Disp: 25 mL, Rfl: 1   isosorbide -hydrALAZINE  (BIDIL ) 20-37.5 MG tablet, Take 1 tablet by mouth 3 (three) times daily., Disp: 270 tablet, Rfl: 3   levothyroxine  (SYNTHROID ) 50 MCG tablet, TAKE 1 TABLET (50 MCG TOTAL) BY MOUTH DAILY BEFORE BREAKFAST (AM), Disp: 30 tablet, Rfl: 0   Menthol -Camphor (ICY HOT PRO NO MESS EX), Apply  1 Application topically 3 (three) times daily as needed (arms, neuropathy lower extrimity, gout in hand.)., Disp: , Rfl:    metolazone  (ZAROXOLYN ) 2.5 MG tablet, Take 2.5 mg by mouth as needed., Disp: , Rfl:    metolazone  (ZAROXOLYN ) 2.5 MG tablet, Take 1 tablet (2.5 mg total) by mouth every other day for 3 doses., Disp: 3 tablet, Rfl: 0   metoprolol  succinate (TOPROL -XL) 50 MG 24 hr tablet, Take 1 tablet (50 mg total) by mouth daily., Disp: 90 tablet, Rfl: 3   NYAMYC  powder, Apply 1 Application topically 2 (two) times daily., Disp: , Rfl:    nystatin  cream (MYCOSTATIN ), Apply 1 Application topically 2 (two) times daily as needed for dry skin., Disp: , Rfl:    Omega-3 Fatty Acids (FISH OIL PO), Take 1 tablet by mouth daily., Disp: , Rfl:    pantoprazole  (PROTONIX ) 40 MG tablet, TAKE 1 TABLET (40 MG TOTAL) BY MOUTH DAILY(AM), Disp: 30 tablet, Rfl: 0   polyethylene glycol (MIRALAX  / GLYCOLAX ) 17 g packet, Take 17 g by mouth daily as needed for  moderate constipation., Disp: 14 each, Rfl: 0   potassium chloride  SA (KLOR-CON  M) 20 MEQ tablet, Take 2 tablets (40 mEq total) by mouth every morning AND 1 tablet (20 mEq total) every evening., Disp: 200 tablet, Rfl: 3   rosuvastatin  (CRESTOR ) 40 MG tablet, Take 1 tablet (40 mg total) by mouth daily., Disp: 90 tablet, Rfl: 3   tirzepatide  (MOUNJARO ) 7.5 MG/0.5ML Pen, Inject 7.5 mg into the skin once a week., Disp: 6 mL, Rfl: 1   torsemide  (DEMADEX ) 20 MG tablet, Take 3 tablets (60 mg total) by mouth every morning AND 2 tablets (40 mg total) every evening., Disp: , Rfl:    triamcinolone  ointment (KENALOG ) 0.1 %, Apply topically 2 (two) times daily., Disp: 454 g, Rfl: 0   VITAMIN E PO, Take 1 tablet by mouth daily., Disp: , Rfl:  Allergies  Allergen Reactions   Neurontin  [Gabapentin ] Nausea And Vomiting and Other (See Comments)    POSSIBLE SHAKING   Lyrica  [Pregabalin ] Other (See Comments)    Shaking    Other     Neurontin        Social History   Socioeconomic History   Marital status: Single    Spouse name: Not on file   Number of children: 1   Years of education: 12   Highest education level: 12th grade  Occupational History   Occupation: disabled  Tobacco Use   Smoking status: Every Day    Current packs/day: 1.00    Average packs/day: 1 pack/day for 44.0 years (44.0 ttl pk-yrs)    Types: E-cigarettes, Cigarettes   Smokeless tobacco: Former    Types: Snuff  Vaping Use   Vaping status: Former   Devices: 11/26/2018 stopped months ago  Substance and Sexual Activity   Alcohol use: Not Currently    Comment: occ   Drug use: Yes    Types: Crack cocaine, Marijuana, Oxycodone    Sexual activity: Not Currently  Other Topics Concern   Not on file  Social History Narrative   ** Merged History Encounter **       Lives in [place], in motel with sister.  They are looking to move but don't have a place to go yet.     Social Drivers of Corporate Investment Banker Strain: Low Risk   (02/09/2023)   Overall Financial Resource Strain (CARDIA)    Difficulty of Paying Living Expenses: Not hard at all  Food  Insecurity: No Food Insecurity (03/09/2024)   Hunger Vital Sign    Worried About Running Out of Food in the Last Year: Never true    Ran Out of Food in the Last Year: Never true  Transportation Needs: Unmet Transportation Needs (03/09/2024)   PRAPARE - Transportation    Lack of Transportation (Medical): Yes    Lack of Transportation (Non-Medical): Yes  Physical Activity: Inactive (02/09/2023)   Exercise Vital Sign    Days of Exercise per Week: 0 days    Minutes of Exercise per Session: 0 min  Stress: No Stress Concern Present (02/09/2023)   [employer] of Occupational Health - Occupational Stress Questionnaire    Feeling of Stress : Not at all  Social Connections: Unknown (08/21/2023)   Received from [facility]   Social Network    Social Network: Not on file  Intimate Partner Violence: Not At Risk (03/09/2024)   Humiliation, Afraid, Rape, and Kick questionnaire    Fear of Current or Ex-Partner: No    Emotionally Abused: No    Physically Abused: No    Sexually Abused: No    Physical Exam      Future Appointments  Date Time Provider Department Center  10/31/2024 10:55 AM Aniceto Daphne CROME, NP [facility]  11/15/2024  3:00 PM [facility] PA/NP [facility] None       [name], Paramedic [phone] [facility] Paramedic  09/17/24

## 2024-09-23 ENCOUNTER — Other Ambulatory Visit (HOSPITAL_COMMUNITY): Payer: Self-pay

## 2024-09-23 NOTE — Progress Notes (Signed)
 Paramedicine Encounter    Patient ID: [name], female    DOB: [birth year], 62 y.o.   MRN: [id number]   Complaints-feet pain   Edema-none   Compliance with meds-yes   Pill box filled-yes  If so, by whom-paramedic   Refills needed- bidil  Eliquis   Amio   Ordered these for delivery today.   Needs eliquis  in the sat am and pm dose  Doc still has not sent in her folic acid .  Will come back out on Thursday to place eliquis  where needed  She will call pcp office about the folic acid .      Pt reports she is doing ok. She denies increased sob, no dizziness, no c/p.  Her weight is down from last week. Reports urination ok. No bleeding issues.  Slight wheeze to her rt lower lobe. Has not used inhaler yet. Just woke up when I had called her this morning.  Legs look good.  She had just taken her am dose of meds during our visit.   Meds verified and pill box refilled.   BP 122/60   Pulse 64   Resp 15   Wt 208 lb (94.3 kg)   LMP  (LMP Unknown)   SpO2 97%   BMI 36.85 kg/m  Weight yesterday-? Last visit weight-213  Patient Care Team: Campbell Reynolds, NP as PCP - General Court Dorn PARAS, MD as PCP - Cardiology (Cardiology) Rolan Ezra GORMAN, MD as PCP - Advanced Heart Failure (Cardiology) Inocencio Soyla Lunger, MD as PCP - Electrophysiology (Cardiology) Court Dorn PARAS, MD as Consulting Physician (Cardiology) Darlean Ozell NOVAK, MD as Consulting Physician (Pulmonary Disease) System, Provider Not In  Patient Active Problem List   Diagnosis Date Noted   Anemia of chronic renal failure 08/06/2024   Persistent atrial fibrillation ([name]) 03/15/2024   Benign neoplasm of ascending colon 03/14/2024   Benign neoplasm of transverse colon 03/14/2024   Benign neoplasm of descending colon 03/14/2024   Benign neoplasm of sigmoid colon 03/14/2024   Chronic systolic congestive heart failure ([name]) 03/12/2024   Melena 03/12/2024   Heme positive stool 03/11/2024   GI bleed 03/08/2024    History of CAD (coronary artery disease) 03/08/2024   CKD (chronic kidney disease), stage IV ([name]) 03/08/2024   Continuous dependence on cigarette smoking 03/08/2024   GAD (generalized anxiety disorder) 03/08/2024   HFrEF (heart failure with reduced ejection fraction) ([name]) 10/11/2023   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) and diastolic (congestive) heart failure ([name]) 10/10/2023   Pre-ulcerative calluses 04/25/2022   PAF (paroxysmal atrial fibrillation) ([name]) 05/05/2021   COPD (chronic obstructive pulmonary disease) ([name]) 05/05/2021   OSA (obstructive sleep apnea) 05/05/2021   Stage 3b chronic kidney disease ([name]) 05/05/2021   Pressure injury of skin 05/04/2021   Diabetic nephropathy ([name]) 01/02/2021   Low back pain 06/01/2020   Hyperlipidemia 04/03/2020   Peripheral artery disease 12/26/2019   NICM (nonischemic cardiomyopathy) ([name]) 06/20/2019   Non-healing ulcer ([name]) 06/20/2019   Coagulation disorder 08/09/2017   Depression 07/21/2017   At risk for adverse drug reaction 06/20/2017   Peripheral neuropathy 06/20/2017   S/P transmetatarsal amputation of foot, right ([name]) 06/05/2017   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) 05/19/2017   Obesity, class 2 02/24/2016   Anticoagulation management encounter 02/10/2016   Chronic sinus bradycardia 01/12/2016   Essential hypertension 12/22/2015   Demand ischemia ([name])    Acute on chronic diastolic CHF (congestive heart failure) ([name])    Symptomatic anemia  11/08/2015   Hypokalemia 11/08/2015   Tobacco abuse 10/23/2015   Coronary artery disease    DOE (dyspnea on exertion) 04/29/2015   Paroxysmal atrial fibrillation ([name]) 01/16/2015   Carotid artery stenosis 01/16/2015   Insomnia 02/03/2014   S/P peripheral artery angioplasty - TurboHawk atherectomy; R SFA 09/11/2013    Class: Acute   Leg pain, bilateral 08/19/2013   Hypothyroidism 07/31/2013   History of cocaine  abuse ([name]) 06/13/2013   Long term current use of anticoagulant therapy 05/20/2013   Alcohol abuse    Narcotic abuse ([name])    Marijuana abuse    Alcoholic cirrhosis ([name])    Insulin  dependent type 2 diabetes mellitus ([name])     Current Outpatient Medications:    acetaminophen  (TYLENOL ) 500 MG tablet, Take 1,000 mg by mouth every 6 (six) hours as needed for headache (pain)., Disp: , Rfl:    albuterol  (VENTOLIN  HFA) 108 (90 Base) MCG/ACT inhaler, USE 2 PUFFS BY MOUTH EVERY FOUR HOURS, AS NEEDED, FOR COUGHING/WHEEZING, Disp: 8.5 g, Rfl: 0   allopurinol  (ZYLOPRIM ) 100 MG tablet, Take 150 mg by mouth daily., Disp: , Rfl:    amiodarone  (PACERONE ) 200 MG tablet, Take 0.5 tablets (100 mg total) by mouth daily., Disp: 30 tablet, Rfl: 1   budesonide -formoterol  (SYMBICORT ) 160-4.5 MCG/ACT inhaler, Inhale 2 puffs into the lungs 2 (two) times daily., Disp: 1 each, Rfl: 2   buPROPion  (WELLBUTRIN  XL) 300 MG 24 hr tablet, Take 300 mg by mouth every morning., Disp: , Rfl:    Cholecalciferol  (VITAMIN D3) 50 MCG (2000 UT) capsule, Take 1 capsule by mouth every morning., Disp: , Rfl:    colchicine  0.6 MG tablet, Take 1.2 mg by mouth See admin instructions. Take 1.2 mg for flair up and an hour later take 0.6 mg if needed, Disp: , Rfl:    Continuous Glucose Sensor (FREESTYLE LIBRE 3 PLUS SENSOR) MISC, as directed., Disp: , Rfl:    ELIQUIS  5 MG TABS tablet, TAKE 1 TABLET (5 MG TOTAL) BY MOUTH 2 (TWO) TIMES DAILY., Disp: 60 tablet, Rfl: 6   fluticasone  (FLONASE) 50 MCG/ACT nasal spray, Place 1 spray into both nostrils as needed for allergies or rhinitis., Disp: , Rfl:    insulin  glargine (LANTUS ) 100 UNIT/ML injection, Inject 58 Units into the skin at bedtime., Disp: , Rfl:    isosorbide -hydrALAZINE  (BIDIL ) 20-37.5 MG tablet, Take 1 tablet by mouth 3 (three) times daily., Disp: 270 tablet, Rfl: 3   levothyroxine  (SYNTHROID ) 50 MCG tablet, TAKE 1 TABLET (50 MCG TOTAL) BY MOUTH DAILY BEFORE BREAKFAST (AM), Disp: 30  tablet, Rfl: 0   metoprolol  succinate (TOPROL -XL) 50 MG 24 hr tablet, Take 1 tablet (50 mg total) by mouth daily., Disp: 90 tablet, Rfl: 3   Omega-3 Fatty Acids (FISH OIL PO), Take 1 tablet by mouth daily., Disp: , Rfl:    pantoprazole  (PROTONIX ) 40 MG tablet, TAKE 1 TABLET (40 MG TOTAL) BY MOUTH DAILY(AM), Disp: 30 tablet, Rfl: 0   potassium chloride  SA (KLOR-CON  M) 20 MEQ tablet, Take 2 tablets (40 mEq total) by mouth every morning AND 1 tablet (20 mEq total) every evening., Disp: 200 tablet, Rfl: 3   rosuvastatin  (CRESTOR ) 40 MG tablet, Take 1 tablet (40 mg total) by mouth daily., Disp: 90 tablet, Rfl: 3   tirzepatide  (MOUNJARO ) 7.5 MG/0.5ML Pen, Inject 7.5 mg into the skin once a week., Disp: 6 mL, Rfl: 1   torsemide  (DEMADEX ) 20 MG tablet, Take 3 tablets (60 mg total) by mouth every morning AND 2  tablets (40 mg total) every evening., Disp: , Rfl:    triamcinolone  ointment (KENALOG ) 0.1 %, Apply topically 2 (two) times daily., Disp: 454 g, Rfl: 0   VITAMIN E PO, Take 1 tablet by mouth daily., Disp: , Rfl:    Continuous Glucose Receiver (DEXCOM G7 RECEIVER) DEVI, 1 Device by Does not apply route continuous., Disp: 1 each, Rfl: 0   Continuous Glucose Sensor (DEXCOM G7 SENSOR) MISC, 1 Device by Does not apply route continuous., Disp: 9 each, Rfl: 3   docusate sodium  (COLACE) 100 MG capsule, Take 2 capsules (200 mg total) by mouth at bedtime., Disp: 10 capsule, Rfl: 0   erythromycin ophthalmic ointment, Place 1 Application into the right eye 2 (two) times daily., Disp: , Rfl:    folic acid  (FOLVITE ) 1 MG tablet, Take 1 tablet (1 mg total) by mouth daily., Disp: 30 tablet, Rfl: 0   gabapentin  (NEURONTIN ) 100 MG capsule, Take 1 capsule (100 mg total) by mouth 3 (three) times daily., Disp: 270 capsule, Rfl: 1   Glucagon  (BAQSIMI  ONE PACK) 3 MG/DOSE POWD, Place 1 Device into the nose as needed (Low blood sugar with impaired consciousness)., Disp: 2 each, Rfl: 3   insulin  lispro (HUMALOG ) 100 UNIT/ML  KwikPen, Inject 15 Units into the skin 3 (three) times daily with meals. Plus sliding scale, max dose 70 units /day (Patient taking differently: Inject 10 Units into the skin 3 (three) times daily with meals. Plus sliding scale, max dose 70 units /day), Disp: 25 mL, Rfl: 1   Menthol -Camphor (ICY HOT PRO NO MESS EX), Apply 1 Application topically 3 (three) times daily as needed (arms, neuropathy lower extrimity, gout in hand.)., Disp: , Rfl:    metolazone  (ZAROXOLYN ) 2.5 MG tablet, Take 2.5 mg by mouth as needed., Disp: , Rfl:    metolazone  (ZAROXOLYN ) 2.5 MG tablet, Take 1 tablet (2.5 mg total) by mouth every other day for 3 doses., Disp: 3 tablet, Rfl: 0   NYAMYC  powder, Apply 1 Application topically 2 (two) times daily., Disp: , Rfl:    nystatin  cream (MYCOSTATIN ), Apply 1 Application topically 2 (two) times daily as needed for dry skin., Disp: , Rfl:    polyethylene glycol (MIRALAX  / GLYCOLAX ) 17 g packet, Take 17 g by mouth daily as needed for moderate constipation., Disp: 14 each, Rfl: 0 Allergies  Allergen Reactions   Neurontin  [Gabapentin ] Nausea And Vomiting and Other (See Comments)    POSSIBLE SHAKING   Lyrica  [Pregabalin ] Other (See Comments)    Shaking    Other     Neurontin        Social History   Socioeconomic History   Marital status: Single    Spouse name: Not on file   Number of children: 1   Years of education: 12   Highest education level: 12th grade  Occupational History   Occupation: disabled  Tobacco Use   Smoking status: Every Day    Current packs/day: 1.00    Average packs/day: 1 pack/day for 44.0 years (44.0 ttl pk-yrs)    Types: E-cigarettes, Cigarettes   Smokeless tobacco: Former    Types: Snuff  Vaping Use   Vaping status: Former   Devices: 11/26/2018 stopped months ago  Substance and Sexual Activity   Alcohol use: Not Currently    Comment: occ   Drug use: Yes    Types: Crack cocaine, Marijuana, Oxycodone    Sexual activity: Not Currently   Other Topics Concern   Not on file  Social History Narrative   **  Merged History Encounter **       Lives in [place], in motel with sister.  They are looking to move but don't have a place to go yet.     Social Drivers of Health   Financial Resource Strain: Low Risk  (02/09/2023)   Overall Financial Resource Strain (CARDIA)    Difficulty of Paying Living Expenses: Not hard at all  Food Insecurity: No Food Insecurity (03/09/2024)   Hunger Vital Sign    Worried About Running Out of Food in the Last Year: Never true    Ran Out of Food in the Last Year: Never true  Transportation Needs: Unmet Transportation Needs (03/09/2024)   PRAPARE - Transportation    Lack of Transportation (Medical): Yes    Lack of Transportation (Non-Medical): Yes  Physical Activity: Inactive (02/09/2023)   Exercise Vital Sign    Days of Exercise per Week: 0 days    Minutes of Exercise per Session: 0 min  Stress: No Stress Concern Present (02/09/2023)   [employer] - Occupational Stress Questionnaire    Feeling of Stress : Not at all  Social Connections: Unknown (08/21/2023)   Received [facility]   Social Network    Social Network: Not on file  Intimate Partner Violence: Not At Risk (03/09/2024)   Humiliation, Afraid, Rape, and Kick questionnaire    Fear of Current or Ex-Partner: No    Emotionally Abused: No    Physically Abused: No    Sexually Abused: No    Physical Exam      Future Appointments  Date Time Provider Department Center  10/31/2024 10:55 AM Aniceto Daphne CROME, NP [facility] H[facility]V  11/15/2024  3:00 PM [facility] PA/NP [facility] None       [name], Paramedic [phone] [facility] Paramedic  09/23/24

## 2024-09-26 ENCOUNTER — Other Ambulatory Visit (HOSPITAL_COMMUNITY): Payer: Self-pay

## 2024-09-26 NOTE — Progress Notes (Addendum)
 Paramedicine Encounter    Patient ID: [name], female    DOB: Aug 30, 1961, 63 y.o.   MRN: [id number]  Came out for med rec to place eliquis  in pill box.   Will see her again on Monday.   Patient Care Team: Campbell Reynolds, NP as PCP - General Court Dorn PARAS, MD as PCP - Cardiology (Cardiology) Rolan Ezra GORMAN, MD as PCP - Advanced Heart Failure (Cardiology) Inocencio Soyla Lunger, MD as PCP - Electrophysiology (Cardiology) Court Dorn PARAS, MD as Consulting Physician (Cardiology) Darlean Ozell NOVAK, MD as Consulting Physician (Pulmonary Disease) System, Provider Not In  Patient Active Problem List   Diagnosis Date Noted   Anemia of chronic renal failure 08/06/2024   Persistent atrial fibrillation ([name]) 03/15/2024   Benign neoplasm of ascending colon 03/14/2024   Benign neoplasm of transverse colon 03/14/2024   Benign neoplasm of descending colon 03/14/2024   Benign neoplasm of sigmoid colon 03/14/2024   Chronic systolic congestive heart failure ([name]) 03/12/2024   Melena 03/12/2024   Heme positive stool 03/11/2024   GI bleed 03/08/2024   History of CAD (coronary artery disease) 03/08/2024   CKD (chronic kidney disease), stage IV ([name]) 03/08/2024   Continuous dependence on cigarette smoking 03/08/2024   GAD (generalized anxiety disorder) 03/08/2024   HFrEF (heart failure with reduced ejection fraction) ([name]) 10/11/2023   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) and diastolic (congestive) heart failure ([name]) 10/10/2023   Pre-ulcerative calluses 04/25/2022   PAF (paroxysmal atrial fibrillation) ([name]) 05/05/2021   COPD (chronic obstructive pulmonary disease) ([name]) 05/05/2021   OSA (obstructive sleep apnea) 05/05/2021   Stage 3b chronic kidney disease ([name]) 05/05/2021   Pressure injury of skin 05/04/2021   Diabetic nephropathy ([name]) 01/02/2021   Low back pain 06/01/2020   Hyperlipidemia 04/03/2020   Peripheral artery disease 12/26/2019   NICM (nonischemic  cardiomyopathy) ([name]) 06/20/2019   Non-healing ulcer ([name]) 06/20/2019   Coagulation disorder 08/09/2017   Depression 07/21/2017   At risk for adverse drug reaction 06/20/2017   Peripheral neuropathy 06/20/2017   S/P transmetatarsal amputation of foot, right ([name]) 06/05/2017   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) 05/19/2017   Obesity, class 2 02/24/2016   Anticoagulation management encounter 02/10/2016   Chronic sinus bradycardia 01/12/2016   Essential hypertension 12/22/2015   Demand ischemia ([name])    Acute on chronic diastolic CHF (congestive heart failure) ([name])    Symptomatic anemia 11/08/2015   Hypokalemia 11/08/2015   Tobacco abuse 10/23/2015   Coronary artery disease    DOE (dyspnea on exertion) 04/29/2015   Paroxysmal atrial fibrillation ([name]) 01/16/2015   Carotid artery stenosis 01/16/2015   Insomnia 02/03/2014   S/P peripheral artery angioplasty - TurboHawk atherectomy; R SFA 09/11/2013    Class: Acute   Leg pain, bilateral 08/19/2013   Hypothyroidism 07/31/2013   History of cocaine abuse ([name]) 06/13/2013   Long term current use of anticoagulant therapy 05/20/2013   Alcohol abuse    Narcotic abuse ([name])    Marijuana abuse    Alcoholic cirrhosis ([name])    Insulin  dependent type 2 diabetes mellitus ([name])     Current Outpatient Medications:    acetaminophen  (TYLENOL ) 500 MG tablet, Take 1,000 mg by mouth every 6 (six) hours as needed for headache (pain)., Disp: , Rfl:    albuterol  (VENTOLIN  HFA) 108 (90 Base) MCG/ACT inhaler, USE 2 PUFFS BY MOUTH EVERY FOUR HOURS, AS NEEDED, FOR COUGHING/WHEEZING, Disp: 8.5 g, Rfl: 0   allopurinol  (ZYLOPRIM )  100 MG tablet, Take 150 mg by mouth daily., Disp: , Rfl:    amiodarone  (PACERONE ) 200 MG tablet, Take 0.5 tablets (100 mg total) by mouth daily., Disp: 30 tablet, Rfl: 1   budesonide -formoterol  (SYMBICORT ) 160-4.5 MCG/ACT inhaler, Inhale 2 puffs into the lungs 2 (two) times  daily., Disp: 1 each, Rfl: 2   buPROPion  (WELLBUTRIN  XL) 300 MG 24 hr tablet, Take 300 mg by mouth every morning., Disp: , Rfl:    Cholecalciferol  (VITAMIN D3) 50 MCG (2000 UT) capsule, Take 1 capsule by mouth every morning., Disp: , Rfl:    colchicine  0.6 MG tablet, Take 1.2 mg by mouth See admin instructions. Take 1.2 mg for flair up and an hour later take 0.6 mg if needed, Disp: , Rfl:    Continuous Glucose Receiver (DEXCOM G7 RECEIVER) DEVI, 1 Device by Does not apply route continuous., Disp: 1 each, Rfl: 0   Continuous Glucose Sensor (DEXCOM G7 SENSOR) MISC, 1 Device by Does not apply route continuous., Disp: 9 each, Rfl: 3   Continuous Glucose Sensor (FREESTYLE LIBRE 3 PLUS SENSOR) MISC, as directed., Disp: , Rfl:    docusate sodium  (COLACE) 100 MG capsule, Take 2 capsules (200 mg total) by mouth at bedtime., Disp: 10 capsule, Rfl: 0   ELIQUIS  5 MG TABS tablet, TAKE 1 TABLET (5 MG TOTAL) BY MOUTH 2 (TWO) TIMES DAILY., Disp: 60 tablet, Rfl: 6   erythromycin ophthalmic ointment, Place 1 Application into the right eye 2 (two) times daily., Disp: , Rfl:    fluticasone  (FLONASE) 50 MCG/ACT nasal spray, Place 1 spray into both nostrils as needed for allergies or rhinitis., Disp: , Rfl:    folic acid  (FOLVITE ) 1 MG tablet, Take 1 tablet (1 mg total) by mouth daily., Disp: 30 tablet, Rfl: 0   gabapentin  (NEURONTIN ) 100 MG capsule, Take 1 capsule (100 mg total) by mouth 3 (three) times daily., Disp: 270 capsule, Rfl: 1   Glucagon  (BAQSIMI  ONE PACK) 3 MG/DOSE POWD, Place 1 Device into the nose as needed (Low blood sugar with impaired consciousness)., Disp: 2 each, Rfl: 3   insulin  glargine (LANTUS ) 100 UNIT/ML injection, Inject 58 Units into the skin at bedtime., Disp: , Rfl:    insulin  lispro (HUMALOG ) 100 UNIT/ML KwikPen, Inject 15 Units into the skin 3 (three) times daily with meals. Plus sliding scale, max dose 70 units /day (Patient taking differently: Inject 10 Units into the skin 3 (three) times  daily with meals. Plus sliding scale, max dose 70 units /day), Disp: 25 mL, Rfl: 1   isosorbide -hydrALAZINE  (BIDIL ) 20-37.5 MG tablet, Take 1 tablet by mouth 3 (three) times daily., Disp: 270 tablet, Rfl: 3   levothyroxine  (SYNTHROID ) 50 MCG tablet, TAKE 1 TABLET (50 MCG TOTAL) BY MOUTH DAILY BEFORE BREAKFAST (AM), Disp: 30 tablet, Rfl: 0   Menthol -Camphor (ICY HOT PRO NO MESS EX), Apply 1 Application topically 3 (three) times daily as needed (arms, neuropathy lower extrimity, gout in hand.)., Disp: , Rfl:    metolazone  (ZAROXOLYN ) 2.5 MG tablet, Take 2.5 mg by mouth as needed., Disp: , Rfl:    metolazone  (ZAROXOLYN ) 2.5 MG tablet, Take 1 tablet (2.5 mg total) by mouth every other day for 3 doses., Disp: 3 tablet, Rfl: 0   metoprolol  succinate (TOPROL -XL) 50 MG 24 hr tablet, Take 1 tablet (50 mg total) by mouth daily., Disp: 90 tablet, Rfl: 3   NYAMYC  powder, Apply 1 Application topically 2 (two) times daily., Disp: , Rfl:    nystatin  cream (MYCOSTATIN ), Apply  1 Application topically 2 (two) times daily as needed for dry skin., Disp: , Rfl:    Omega-3 Fatty Acids (FISH OIL PO), Take 1 tablet by mouth daily., Disp: , Rfl:    pantoprazole  (PROTONIX ) 40 MG tablet, TAKE 1 TABLET (40 MG TOTAL) BY MOUTH DAILY(AM), Disp: 30 tablet, Rfl: 0   polyethylene glycol (MIRALAX  / GLYCOLAX ) 17 g packet, Take 17 g by mouth daily as needed for moderate constipation., Disp: 14 each, Rfl: 0   potassium chloride  SA (KLOR-CON  M) 20 MEQ tablet, Take 2 tablets (40 mEq total) by mouth every morning AND 1 tablet (20 mEq total) every evening., Disp: 200 tablet, Rfl: 3   rosuvastatin  (CRESTOR ) 40 MG tablet, Take 1 tablet (40 mg total) by mouth daily., Disp: 90 tablet, Rfl: 3   tirzepatide  (MOUNJARO ) 7.5 MG/0.5ML Pen, Inject 7.5 mg into the skin once a week., Disp: 6 mL, Rfl: 1   torsemide  (DEMADEX ) 20 MG tablet, Take 3 tablets (60 mg total) by mouth every morning AND 2 tablets (40 mg total) every evening., Disp: , Rfl:     triamcinolone  ointment (KENALOG ) 0.1 %, Apply topically 2 (two) times daily., Disp: 454 g, Rfl: 0   VITAMIN E PO, Take 1 tablet by mouth daily., Disp: , Rfl:  Allergies  Allergen Reactions   Neurontin  [Gabapentin ] Nausea And Vomiting and Other (See Comments)    POSSIBLE SHAKING   Lyrica  [Pregabalin ] Other (See Comments)    Shaking    Other     Neurontin        Social History   Socioeconomic History   Marital status: Single    Spouse name: Not on file   Number of children: 1   Years of education: 12   Highest education level: 12th grade  Occupational History   Occupation: disabled  Tobacco Use   Smoking status: Every Day    Current packs/day: 1.00    Average packs/day: 1 pack/day for 44.0 years (44.0 ttl pk-yrs)    Types: E-cigarettes, Cigarettes   Smokeless tobacco: Former    Types: Snuff  Vaping Use   Vaping status: Former   Devices: 11/26/2018 stopped months ago  Substance and Sexual Activity   Alcohol use: Not Currently    Comment: occ   Drug use: Yes    Types: Crack cocaine, Marijuana, Oxycodone    Sexual activity: Not Currently  Other Topics Concern   Not on file  Social History Narrative   ** Merged History Encounter **       Lives in [place], in motel with sister.  They are looking to move but don't have a place to go yet.     Social Drivers of Corporate Investment Banker Strain: Low Risk  (02/09/2023)   Overall Financial Resource Strain (CARDIA)    Difficulty of Paying Living Expenses: Not hard at all  Food Insecurity: No Food Insecurity (03/09/2024)   Hunger Vital Sign    Worried About Running Out of Food in the Last Year: Never true    Ran Out of Food in the Last Year: Never true  Transportation Needs: Unmet Transportation Needs (03/09/2024)   PRAPARE - Transportation    Lack of Transportation (Medical): Yes    Lack of Transportation (Non-Medical): Yes  Physical Activity: Inactive (02/09/2023)   Exercise Vital Sign    Days of Exercise per Week: 0 days     Minutes of Exercise per Session: 0 min  Stress: No Stress Concern Present (02/09/2023)   [employer] of Occupational Health -  Occupational Stress Questionnaire    Feeling of Stress : Not at all  Social Connections: Unknown (08/21/2023)   Received from [facility]   Social Network    Social Network: Not on file  Intimate Partner Violence: Not At Risk (03/09/2024)   Humiliation, Afraid, Rape, and Kick questionnaire    Fear of Current or Ex-Partner: No    Emotionally Abused: No    Physically Abused: No    Sexually Abused: No    Physical Exam      Future Appointments  Date Time Provider Department Center  10/31/2024 10:55 AM Aniceto Daphne CROME, NP [facility] H&[facility]  11/15/2024  3:00 PM [facility] PA/NP [facility] None       [name], Paramedic [phone] [facility] Paramedic  09/26/24

## 2024-09-30 ENCOUNTER — Other Ambulatory Visit (HOSPITAL_COMMUNITY): Payer: Self-pay

## 2024-09-30 NOTE — Progress Notes (Signed)
 Paramedicine Encounter    Patient ID: [name], female    DOB: 01-02-1961, 63 y.o.   MRN: [id number]   Complaints-knee pain/aches and pains   Edema-yes to rt leg   Compliance with meds-yes  Pill box filled-yes  If so, by whom-paramedic   Refills needed-amio--has it in thru wed Torsemide  Humalog -needs rx also   Pt reports she is doing ok. She denies increased sob, no dizziness, no c/p. she did have a fall last week when she went out to eat, she said it felt like she had weights to her leg.  She does have more swelling to the rt leg than the left. Advised her to wear compression stockings-she has some but not sure where they are. Her legs are not weeping at this time.  Advised her to elevate her feet while sitting and to be sure to contact wound clinic if legs begin to weep.      Ordered those meds for delivery.  Will come back out this week to place the amio.  Per pharmacy-he is waiting to hear back from provider-it was sent to NP-ollis   Pt called [facility] the [facility] to request the folic acid  and humalog .   Meds verified and pill box refilled.   Pt also reports that her PCP wants her to have a liver US , the appoint was set up but pt says the office told her that she needs to go to the hosp to get that done and that they do not do those there.  So I told her to reach back out to PCP office and get this clarified.    BP (!) 140/60   Pulse 60   Resp 17   Wt 202 lb (91.6 kg)   LMP  (LMP Unknown)   SpO2 97%   BMI 35.78 kg/m  Weight yesterday- Last visit weight-208  Patient Care Team: Campbell Reynolds, NP as PCP - General Court Dorn PARAS, MD as PCP - Cardiology (Cardiology) Karla Ezra GORMAN, MD as PCP - Advanced Heart Failure (Cardiology) Inocencio Soyla Lunger, MD as PCP - Electrophysiology (Cardiology) Court Dorn PARAS, MD as Consulting Physician (Cardiology) Darlean Ozell NOVAK, MD as Consulting Physician (Pulmonary Disease) System, Provider Not In  Patient  Active Problem List   Diagnosis Date Noted   Anemia of chronic renal failure 08/06/2024   Persistent atrial fibrillation ([name]) 03/15/2024   Benign neoplasm of ascending colon 03/14/2024   Benign neoplasm of transverse colon 03/14/2024   Benign neoplasm of descending colon 03/14/2024   Benign neoplasm of sigmoid colon 03/14/2024   Chronic systolic congestive heart failure ([name]) 03/12/2024   Melena 03/12/2024   Heme positive stool 03/11/2024   GI bleed 03/08/2024   History of CAD (coronary artery disease) 03/08/2024   CKD (chronic kidney disease), stage IV ([name]) 03/08/2024   Continuous dependence on cigarette smoking 03/08/2024   GAD (generalized anxiety disorder) 03/08/2024   HFrEF (heart failure with reduced ejection fraction) ([name]) 10/11/2023   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) and diastolic (congestive) heart failure ([name]) 10/10/2023   Pre-ulcerative calluses 04/25/2022   PAF (paroxysmal atrial fibrillation) ([name]) 05/05/2021   COPD (chronic obstructive pulmonary disease) ([name]) 05/05/2021   OSA (obstructive sleep apnea) 05/05/2021   Stage 3b chronic kidney disease ([name]) 05/05/2021   Pressure injury of skin 05/04/2021   Diabetic nephropathy ([name]) 01/02/2021   Low back pain 06/01/2020   Hyperlipidemia 04/03/2020   Peripheral artery disease 12/26/2019   NICM (nonischemic cardiomyopathy) ([name]) 06/20/2019  Non-healing ulcer ([name]) 06/20/2019   Coagulation disorder 08/09/2017   Depression 07/21/2017   At risk for adverse drug reaction 06/20/2017   Peripheral neuropathy 06/20/2017   S/P transmetatarsal amputation of foot, right ([name]) 06/05/2017   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) 05/19/2017   Obesity, class 2 02/24/2016   Anticoagulation management encounter 02/10/2016   Chronic sinus bradycardia 01/12/2016   Essential hypertension 12/22/2015   Demand ischemia ([name])    Acute on chronic diastolic CHF  (congestive heart failure) ([name])    Symptomatic anemia 11/08/2015   Hypokalemia 11/08/2015   Tobacco abuse 10/23/2015   Coronary artery disease    DOE (dyspnea on exertion) 04/29/2015   Paroxysmal atrial fibrillation ([name]) 01/16/2015   Carotid artery stenosis 01/16/2015   Insomnia 02/03/2014   S/P peripheral artery angioplasty - TurboHawk atherectomy; R SFA 09/11/2013    Class: Acute   Leg pain, bilateral 08/19/2013   Hypothyroidism 07/31/2013   History of cocaine abuse ([name]) 06/13/2013   Long term current use of anticoagulant therapy 05/20/2013   Alcohol abuse    Narcotic abuse ([name])    Marijuana abuse    Alcoholic cirrhosis ([name])    Insulin  dependent type 2 diabetes mellitus ([name])     Current Outpatient Medications:    acetaminophen  (TYLENOL ) 500 MG tablet, Take 1,000 mg by mouth every 6 (six) hours as needed for headache (pain)., Disp: , Rfl:    albuterol  (VENTOLIN  HFA) 108 (90 Base) MCG/ACT inhaler, USE 2 PUFFS BY MOUTH EVERY FOUR HOURS, AS NEEDED, FOR COUGHING/WHEEZING, Disp: 8.5 g, Rfl: 0   allopurinol  (ZYLOPRIM ) 100 MG tablet, Take 150 mg by mouth daily., Disp: , Rfl:    amiodarone  (PACERONE ) 200 MG tablet, Take 0.5 tablets (100 mg total) by mouth daily., Disp: 30 tablet, Rfl: 1   budesonide -formoterol  (SYMBICORT ) 160-4.5 MCG/ACT inhaler, Inhale 2 puffs into the lungs 2 (two) times daily., Disp: 1 each, Rfl: 2   buPROPion  (WELLBUTRIN  XL) 300 MG 24 hr tablet, Take 300 mg by mouth every morning., Disp: , Rfl:    Cholecalciferol  (VITAMIN D3) 50 MCG (2000 UT) capsule, Take 1 capsule by mouth every morning., Disp: , Rfl:    ELIQUIS  5 MG TABS tablet, TAKE 1 TABLET (5 MG TOTAL) BY MOUTH 2 (TWO) TIMES DAILY., Disp: 60 tablet, Rfl: 6   insulin  glargine (LANTUS ) 100 UNIT/ML injection, Inject 58 Units into the skin at bedtime., Disp: , Rfl:    isosorbide -hydrALAZINE  (BIDIL ) 20-37.5 MG tablet, Take 1 tablet by mouth 3 (three) times daily., Disp: 270 tablet, Rfl: 3   levothyroxine   (SYNTHROID ) 50 MCG tablet, TAKE 1 TABLET (50 MCG TOTAL) BY MOUTH DAILY BEFORE BREAKFAST (AM), Disp: 30 tablet, Rfl: 0   metoprolol  succinate (TOPROL -XL) 50 MG 24 hr tablet, Take 1 tablet (50 mg total) by mouth daily., Disp: 90 tablet, Rfl: 3   NYAMYC  powder, Apply 1 Application topically 2 (two) times daily., Disp: , Rfl:    nystatin  cream (MYCOSTATIN ), Apply 1 Application topically 2 (two) times daily as needed for dry skin., Disp: , Rfl:    Omega-3 Fatty Acids (FISH OIL PO), Take 1 tablet by mouth daily., Disp: , Rfl:    pantoprazole  (PROTONIX ) 40 MG tablet, TAKE 1 TABLET (40 MG TOTAL) BY MOUTH DAILY(AM), Disp: 30 tablet, Rfl: 0   potassium chloride  SA (KLOR-CON  M) 20 MEQ tablet, Take 2 tablets (40 mEq total) by mouth every morning AND 1 tablet (20 mEq total) every evening., Disp: 200 tablet, Rfl:  3   rosuvastatin  (CRESTOR ) 40 MG tablet, Take 1 tablet (40 mg total) by mouth daily., Disp: 90 tablet, Rfl: 3   torsemide  (DEMADEX ) 20 MG tablet, Take 3 tablets (60 mg total) by mouth every morning AND 2 tablets (40 mg total) every evening., Disp: , Rfl:    triamcinolone  ointment (KENALOG ) 0.1 %, Apply topically 2 (two) times daily., Disp: 454 g, Rfl: 0   VITAMIN E PO, Take 1 tablet by mouth daily., Disp: , Rfl:    colchicine  0.6 MG tablet, Take 1.2 mg by mouth See admin instructions. Take 1.2 mg for flair up and an hour later take 0.6 mg if needed, Disp: , Rfl:    Continuous Glucose Receiver (DEXCOM G7 RECEIVER) DEVI, 1 Device by Does not apply route continuous., Disp: 1 each, Rfl: 0   Continuous Glucose Sensor (DEXCOM G7 SENSOR) MISC, 1 Device by Does not apply route continuous., Disp: 9 each, Rfl: 3   Continuous Glucose Sensor (FREESTYLE LIBRE 3 PLUS SENSOR) MISC, as directed., Disp: , Rfl:    docusate sodium  (COLACE) 100 MG capsule, Take 2 capsules (200 mg total) by mouth at bedtime., Disp: 10 capsule, Rfl: 0   erythromycin ophthalmic ointment, Place 1 Application into the right eye 2 (two) times  daily., Disp: , Rfl:    fluticasone  (FLONASE) 50 MCG/ACT nasal spray, Place 1 spray into both nostrils as needed for allergies or rhinitis., Disp: , Rfl:    folic acid  (FOLVITE ) 1 MG tablet, Take 1 tablet (1 mg total) by mouth daily., Disp: 30 tablet, Rfl: 0   gabapentin  (NEURONTIN ) 100 MG capsule, Take 1 capsule (100 mg total) by mouth 3 (three) times daily., Disp: 270 capsule, Rfl: 1   Glucagon  (BAQSIMI  ONE PACK) 3 MG/DOSE POWD, Place 1 Device into the nose as needed (Low blood sugar with impaired consciousness)., Disp: 2 each, Rfl: 3   insulin  lispro (HUMALOG ) 100 UNIT/ML KwikPen, Inject 15 Units into the skin 3 (three) times daily with meals. Plus sliding scale, max dose 70 units /day (Patient taking differently: Inject 10 Units into the skin 3 (three) times daily with meals. Plus sliding scale, max dose 70 units /day), Disp: 25 mL, Rfl: 1   Menthol -Camphor (ICY HOT PRO NO MESS EX), Apply 1 Application topically 3 (three) times daily as needed (arms, neuropathy lower extrimity, gout in hand.)., Disp: , Rfl:    metolazone  (ZAROXOLYN ) 2.5 MG tablet, Take 2.5 mg by mouth as needed., Disp: , Rfl:    metolazone  (ZAROXOLYN ) 2.5 MG tablet, Take 1 tablet (2.5 mg total) by mouth every other day for 3 doses., Disp: 3 tablet, Rfl: 0   polyethylene glycol (MIRALAX  / GLYCOLAX ) 17 g packet, Take 17 g by mouth daily as needed for moderate constipation., Disp: 14 each, Rfl: 0   tirzepatide  (MOUNJARO ) 7.5 MG/0.5ML Pen, Inject 7.5 mg into the skin once a week., Disp: 6 mL, Rfl: 1 Allergies  Allergen Reactions   Neurontin  [Gabapentin ] Nausea And Vomiting and Other (See Comments)    POSSIBLE SHAKING   Lyrica  [Pregabalin ] Other (See Comments)    Shaking    Other     Neurontin        Social History   Socioeconomic History   Marital status: Single    Spouse name: Not on file   Number of children: 1   Years of education: 12   Highest education level: 12th grade  Occupational History   Occupation:  disabled  Tobacco Use   Smoking status: Every Day  Current packs/day: 1.00    Average packs/day: 1 pack/day for 44.0 years (44.0 ttl pk-yrs)    Types: E-cigarettes, Cigarettes   Smokeless tobacco: Former    Types: Snuff  Vaping Use   Vaping status: Former   Devices: 11/26/2018 stopped months ago  Substance and Sexual Activity   Alcohol use: Not Currently    Comment: occ   Drug use: Yes    Types: Crack cocaine, Marijuana, Oxycodone    Sexual activity: Not Currently  Other Topics Concern   Not on file  Social History Narrative   ** Merged History Encounter **       Lives in [place], in motel with sister.  They are looking to move but don't have a place to go yet.     Social Drivers of Health   Financial Resource Strain: Low Risk  (02/09/2023)   Overall Financial Resource Strain (CARDIA)    Difficulty of Paying Living Expenses: Not hard at all  Food Insecurity: No Food Insecurity (03/09/2024)   Hunger Vital Sign    Worried About Running Out of Food in the Last Year: Never true    Ran Out of Food in the Last Year: Never true  Transportation Needs: Unmet Transportation Needs (03/09/2024)   PRAPARE - Transportation    Lack of Transportation (Medical): Yes    Lack of Transportation (Non-Medical): Yes  Physical Activity: Inactive (02/09/2023)   Exercise Vital Sign    Days of Exercise per Week: 0 days    Minutes of Exercise per Session: 0 min  Stress: No Stress Concern Present (02/09/2023)   [employer] of [employer]ational [employer] - Occupational Stress Questionnaire    Feeling of Stress : Not at all  Social Connections: Unknown (08/21/2023)   Received from [facility]   Social Network    Social Network: Not on file  Intimate Partner Violence: Not At Risk (03/09/2024)   Humiliation, Afraid, Rape, and Kick questionnaire    Fear of Current or Ex-Partner: No    Emotionally Abused: No    Physically Abused: No    Sexually Abused: No    Physical Exam      Future Appointments   Date Time Provider Department Center  10/31/2024 10:55 AM Aniceto Daphne CROME, NP [facility] H&V  11/15/2024  3:00 PM [facility] PA/NP [facility] None       [name], Paramedic [phone] [facility] Paramedic  09/30/24

## 2024-10-03 ENCOUNTER — Inpatient Hospital Stay (HOSPITAL_COMMUNITY)
Admission: EM | Admit: 2024-10-03 | Discharge: 2024-10-12 | DRG: 377 | Disposition: A | Discharge status: Home / Self Care | Attending: Internal Medicine | Admitting: Internal Medicine

## 2024-10-03 ENCOUNTER — Other Ambulatory Visit: Payer: Self-pay

## 2024-10-03 ENCOUNTER — Encounter (HOSPITAL_COMMUNITY): Payer: Self-pay | Admitting: Internal Medicine

## 2024-10-03 ENCOUNTER — Other Ambulatory Visit (HOSPITAL_COMMUNITY): Payer: Self-pay

## 2024-10-03 ENCOUNTER — Telehealth: Payer: Self-pay | Admitting: Family

## 2024-10-03 ENCOUNTER — Emergency Department (HOSPITAL_COMMUNITY)

## 2024-10-03 DIAGNOSIS — M109 Gout, unspecified: Secondary | ICD-10-CM | POA: Diagnosis present

## 2024-10-03 DIAGNOSIS — D649 Anemia, unspecified: Secondary | ICD-10-CM | POA: Diagnosis present

## 2024-10-03 DIAGNOSIS — I429 Cardiomyopathy, unspecified: Secondary | ICD-10-CM

## 2024-10-03 DIAGNOSIS — I493 Ventricular premature depolarization: Secondary | ICD-10-CM | POA: Diagnosis present

## 2024-10-03 DIAGNOSIS — E038 Other specified hypothyroidism: Secondary | ICD-10-CM

## 2024-10-03 DIAGNOSIS — E8779 Other fluid overload: Secondary | ICD-10-CM

## 2024-10-03 DIAGNOSIS — Z803 Family history of malignant neoplasm of breast: Secondary | ICD-10-CM

## 2024-10-03 DIAGNOSIS — I951 Orthostatic hypotension: Secondary | ICD-10-CM | POA: Diagnosis present

## 2024-10-03 DIAGNOSIS — Z7985 Long-term (current) use of injectable non-insulin antidiabetic drugs: Secondary | ICD-10-CM

## 2024-10-03 DIAGNOSIS — E1122 Type 2 diabetes mellitus with diabetic chronic kidney disease: Secondary | ICD-10-CM | POA: Diagnosis present

## 2024-10-03 DIAGNOSIS — Z89422 Acquired absence of other left toe(s): Secondary | ICD-10-CM

## 2024-10-03 DIAGNOSIS — R4781 Slurred speech: Secondary | ICD-10-CM | POA: Diagnosis present

## 2024-10-03 DIAGNOSIS — Z89431 Acquired absence of right foot: Secondary | ICD-10-CM

## 2024-10-03 DIAGNOSIS — I340 Nonrheumatic mitral (valve) insufficiency: Secondary | ICD-10-CM | POA: Diagnosis present

## 2024-10-03 DIAGNOSIS — I509 Heart failure, unspecified: Secondary | ICD-10-CM

## 2024-10-03 DIAGNOSIS — Z79899 Other long term (current) drug therapy: Secondary | ICD-10-CM

## 2024-10-03 DIAGNOSIS — E785 Hyperlipidemia, unspecified: Secondary | ICD-10-CM | POA: Diagnosis present

## 2024-10-03 DIAGNOSIS — I739 Peripheral vascular disease, unspecified: Secondary | ICD-10-CM | POA: Diagnosis present

## 2024-10-03 DIAGNOSIS — I480 Paroxysmal atrial fibrillation: Secondary | ICD-10-CM | POA: Diagnosis present

## 2024-10-03 DIAGNOSIS — I2510 Atherosclerotic heart disease of native coronary artery without angina pectoris: Secondary | ICD-10-CM | POA: Diagnosis present

## 2024-10-03 DIAGNOSIS — F411 Generalized anxiety disorder: Secondary | ICD-10-CM | POA: Diagnosis present

## 2024-10-03 DIAGNOSIS — Z87440 Personal history of urinary (tract) infections: Secondary | ICD-10-CM

## 2024-10-03 DIAGNOSIS — I5021 Acute systolic (congestive) heart failure: Secondary | ICD-10-CM | POA: Diagnosis not present

## 2024-10-03 DIAGNOSIS — F17210 Nicotine dependence, cigarettes, uncomplicated: Secondary | ICD-10-CM | POA: Diagnosis present

## 2024-10-03 DIAGNOSIS — I10 Essential (primary) hypertension: Secondary | ICD-10-CM | POA: Diagnosis present

## 2024-10-03 DIAGNOSIS — I4819 Other persistent atrial fibrillation: Secondary | ICD-10-CM | POA: Diagnosis present

## 2024-10-03 DIAGNOSIS — Z66 Do not resuscitate: Secondary | ICD-10-CM | POA: Diagnosis present

## 2024-10-03 DIAGNOSIS — E1165 Type 2 diabetes mellitus with hyperglycemia: Secondary | ICD-10-CM

## 2024-10-03 DIAGNOSIS — Z8249 Family history of ischemic heart disease and other diseases of the circulatory system: Secondary | ICD-10-CM

## 2024-10-03 DIAGNOSIS — Z825 Family history of asthma and other chronic lower respiratory diseases: Secondary | ICD-10-CM

## 2024-10-03 DIAGNOSIS — I420 Dilated cardiomyopathy: Secondary | ICD-10-CM | POA: Diagnosis present

## 2024-10-03 DIAGNOSIS — Z6838 Body mass index (BMI) 38.0-38.9, adult: Secondary | ICD-10-CM

## 2024-10-03 DIAGNOSIS — Z515 Encounter for palliative care: Secondary | ICD-10-CM

## 2024-10-03 DIAGNOSIS — Z7951 Long term (current) use of inhaled steroids: Secondary | ICD-10-CM

## 2024-10-03 DIAGNOSIS — Z9889 Other specified postprocedural states: Secondary | ICD-10-CM

## 2024-10-03 DIAGNOSIS — Z832 Family history of diseases of the blood and blood-forming organs and certain disorders involving the immune mechanism: Secondary | ICD-10-CM

## 2024-10-03 DIAGNOSIS — J9811 Atelectasis: Secondary | ICD-10-CM | POA: Diagnosis present

## 2024-10-03 DIAGNOSIS — E1151 Type 2 diabetes mellitus with diabetic peripheral angiopathy without gangrene: Secondary | ICD-10-CM | POA: Diagnosis present

## 2024-10-03 DIAGNOSIS — Z8679 Personal history of other diseases of the circulatory system: Secondary | ICD-10-CM

## 2024-10-03 DIAGNOSIS — J441 Chronic obstructive pulmonary disease with (acute) exacerbation: Secondary | ICD-10-CM | POA: Insufficient documentation

## 2024-10-03 DIAGNOSIS — E669 Obesity, unspecified: Secondary | ICD-10-CM | POA: Diagnosis present

## 2024-10-03 DIAGNOSIS — I2489 Other forms of acute ischemic heart disease: Secondary | ICD-10-CM | POA: Diagnosis present

## 2024-10-03 DIAGNOSIS — I5033 Acute on chronic diastolic (congestive) heart failure: Secondary | ICD-10-CM

## 2024-10-03 DIAGNOSIS — F17290 Nicotine dependence, other tobacco product, uncomplicated: Secondary | ICD-10-CM | POA: Diagnosis present

## 2024-10-03 DIAGNOSIS — I130 Hypertensive heart and chronic kidney disease with heart failure and stage 1 through stage 4 chronic kidney disease, or unspecified chronic kidney disease: Secondary | ICD-10-CM | POA: Diagnosis present

## 2024-10-03 DIAGNOSIS — N3000 Acute cystitis without hematuria: Secondary | ICD-10-CM | POA: Diagnosis present

## 2024-10-03 DIAGNOSIS — E119 Type 2 diabetes mellitus without complications: Secondary | ICD-10-CM

## 2024-10-03 DIAGNOSIS — F32A Depression, unspecified: Secondary | ICD-10-CM | POA: Diagnosis present

## 2024-10-03 DIAGNOSIS — G9340 Encephalopathy, unspecified: Secondary | ICD-10-CM | POA: Diagnosis present

## 2024-10-03 DIAGNOSIS — I472 Ventricular tachycardia, unspecified: Secondary | ICD-10-CM | POA: Diagnosis present

## 2024-10-03 DIAGNOSIS — J449 Chronic obstructive pulmonary disease, unspecified: Secondary | ICD-10-CM | POA: Diagnosis present

## 2024-10-03 DIAGNOSIS — Z5982 Transportation insecurity: Secondary | ICD-10-CM

## 2024-10-03 DIAGNOSIS — E1142 Type 2 diabetes mellitus with diabetic polyneuropathy: Secondary | ICD-10-CM | POA: Diagnosis present

## 2024-10-03 DIAGNOSIS — Z23 Encounter for immunization: Secondary | ICD-10-CM

## 2024-10-03 DIAGNOSIS — I5023 Acute on chronic systolic (congestive) heart failure: Secondary | ICD-10-CM | POA: Diagnosis present

## 2024-10-03 DIAGNOSIS — E039 Hypothyroidism, unspecified: Secondary | ICD-10-CM | POA: Diagnosis present

## 2024-10-03 DIAGNOSIS — K7030 Alcoholic cirrhosis of liver without ascites: Secondary | ICD-10-CM | POA: Diagnosis present

## 2024-10-03 DIAGNOSIS — K219 Gastro-esophageal reflux disease without esophagitis: Secondary | ICD-10-CM | POA: Diagnosis present

## 2024-10-03 DIAGNOSIS — Z794 Long term (current) use of insulin: Secondary | ICD-10-CM

## 2024-10-03 DIAGNOSIS — N184 Chronic kidney disease, stage 4 (severe): Secondary | ICD-10-CM | POA: Diagnosis present

## 2024-10-03 DIAGNOSIS — I447 Left bundle-branch block, unspecified: Secondary | ICD-10-CM | POA: Diagnosis present

## 2024-10-03 DIAGNOSIS — Z7901 Long term (current) use of anticoagulants: Secondary | ICD-10-CM

## 2024-10-03 DIAGNOSIS — K921 Melena: Principal | ICD-10-CM | POA: Diagnosis present

## 2024-10-03 DIAGNOSIS — K922 Gastrointestinal hemorrhage, unspecified: Principal | ICD-10-CM | POA: Diagnosis present

## 2024-10-03 DIAGNOSIS — R7989 Other specified abnormal findings of blood chemistry: Secondary | ICD-10-CM

## 2024-10-03 DIAGNOSIS — Z716 Tobacco abuse counseling: Secondary | ICD-10-CM

## 2024-10-03 DIAGNOSIS — Z833 Family history of diabetes mellitus: Secondary | ICD-10-CM

## 2024-10-03 DIAGNOSIS — R072 Precordial pain: Secondary | ICD-10-CM

## 2024-10-03 DIAGNOSIS — Z79890 Hormone replacement therapy (postmenopausal): Secondary | ICD-10-CM

## 2024-10-03 DIAGNOSIS — E7849 Other hyperlipidemia: Secondary | ICD-10-CM

## 2024-10-03 LAB — URINALYSIS, ROUTINE W REFLEX MICROSCOPIC
Bilirubin Urine: NEGATIVE
Glucose, UA: NEGATIVE mg/dL
Hgb urine dipstick: NEGATIVE
Ketones, ur: NEGATIVE mg/dL
Nitrite: POSITIVE — AB
Protein, ur: NEGATIVE mg/dL
Specific Gravity, Urine: 1.011 (ref 1.005–1.030)
pH: 5 (ref 5.0–8.0)

## 2024-10-03 LAB — COMPREHENSIVE METABOLIC PANEL WITH GFR
ALT: 18 U/L (ref 0–44)
AST: 17 U/L (ref 15–41)
Albumin: 3.2 g/dL — ABNORMAL LOW (ref 3.5–5.0)
Alkaline Phosphatase: 94 U/L (ref 38–126)
Anion gap: 17 — ABNORMAL HIGH (ref 5–15)
BUN: 98 mg/dL — ABNORMAL HIGH (ref 8–23)
CO2: 22 mmol/L (ref 22–32)
Calcium: 8.4 mg/dL — ABNORMAL LOW (ref 8.9–10.3)
Chloride: 103 mmol/L (ref 98–111)
Creatinine, Ser: 2.46 mg/dL — ABNORMAL HIGH (ref 0.44–1.00)
GFR, Estimated: 22 mL/min — ABNORMAL LOW (ref >60)
Glucose, Bld: 210 mg/dL — ABNORMAL HIGH (ref 70–99)
Potassium: 5 mmol/L (ref 3.5–5.1)
Sodium: 142 mmol/L (ref 135–145)
Total Bilirubin: 0.5 mg/dL (ref 0.0–1.2)
Total Protein: 6.2 g/dL — ABNORMAL LOW (ref 6.5–8.1)

## 2024-10-03 LAB — CBC
HCT: 29 % — ABNORMAL LOW (ref 36.0–46.0)
Hemoglobin: 8.6 g/dL — ABNORMAL LOW (ref 12.0–15.0)
MCH: 29.5 pg (ref 26.0–34.0)
MCHC: 29.7 g/dL — ABNORMAL LOW (ref 30.0–36.0)
MCV: 99.3 fL (ref 80.0–100.0)
Platelets: 220 K/uL (ref 150–400)
RBC: 2.92 MIL/uL — ABNORMAL LOW (ref 3.87–5.11)
RDW: 18.7 % — ABNORMAL HIGH (ref 11.5–15.5)
WBC: 5.8 K/uL (ref 4.0–10.5)
nRBC: 0 % (ref 0.0–0.2)

## 2024-10-03 LAB — BRAIN NATRIURETIC PEPTIDE: B Natriuretic Peptide: 386.8 pg/mL — ABNORMAL HIGH (ref 0.0–100.0)

## 2024-10-03 LAB — LIPASE, BLOOD: Lipase: 34 U/L (ref 11–51)

## 2024-10-03 MED ORDER — FUROSEMIDE 10 MG/ML IJ SOLN
80.0000 mg | Freq: Two times a day (BID) | INTRAMUSCULAR | Status: DC
  Administered 2024-10-04 – 2024-10-09 (×11): 80 mg via INTRAVENOUS
  Filled 2024-10-03 (×11): qty 8

## 2024-10-03 MED ORDER — METHYLPREDNISOLONE SODIUM SUCC 40 MG IJ SOLR
40.0000 mg | Freq: Every day | INTRAMUSCULAR | Status: DC
Start: 2024-10-04 — End: 2024-10-06
  Administered 2024-10-04 – 2024-10-05 (×2): 40 mg via INTRAVENOUS
  Filled 2024-10-03 (×2): qty 1

## 2024-10-03 MED ORDER — INSULIN ASPART 100 UNIT/ML IJ SOLN
0.0000 [IU] | Freq: Every day | INTRAMUSCULAR | Status: DC
  Administered 2024-10-04: 3 [IU] via SUBCUTANEOUS
  Administered 2024-10-05: 5 [IU] via SUBCUTANEOUS
  Administered 2024-10-06: 4 [IU] via SUBCUTANEOUS
  Administered 2024-10-07: 3 [IU] via SUBCUTANEOUS
  Administered 2024-10-10: 2 [IU] via SUBCUTANEOUS
  Administered 2024-10-11: 3 [IU] via SUBCUTANEOUS
  Filled 2024-10-03: qty 3
  Filled 2024-10-03: qty 2
  Filled 2024-10-03: qty 4
  Filled 2024-10-03: qty 5
  Filled 2024-10-03: qty 1
  Filled 2024-10-03: qty 3

## 2024-10-03 MED ORDER — LEVOTHYROXINE SODIUM 50 MCG PO TABS
50.0000 ug | ORAL_TABLET | Freq: Every day | ORAL | Status: DC
  Administered 2024-10-04 – 2024-10-12 (×9): 50 ug via ORAL
  Filled 2024-10-03 (×4): qty 1
  Filled 2024-10-03: qty 2
  Filled 2024-10-03 (×3): qty 1
  Filled 2024-10-03: qty 2
  Filled 2024-10-03: qty 1

## 2024-10-03 MED ORDER — INSULIN GLARGINE-YFGN 100 UNIT/ML ~~LOC~~ SOLN
50.0000 [IU] | Freq: Every day | SUBCUTANEOUS | Status: DC
  Administered 2024-10-04 – 2024-10-06 (×3): 50 [IU] via SUBCUTANEOUS
  Filled 2024-10-03 (×5): qty 0.5

## 2024-10-03 MED ORDER — PANTOPRAZOLE SODIUM 40 MG IV SOLR
40.0000 mg | Freq: Once | INTRAVENOUS | Status: AC
  Administered 2024-10-03: 40 mg via INTRAVENOUS
  Filled 2024-10-03: qty 10

## 2024-10-03 MED ORDER — PANTOPRAZOLE SODIUM 40 MG IV SOLR
40.0000 mg | Freq: Two times a day (BID) | INTRAVENOUS | Status: DC
  Administered 2024-10-04 – 2024-10-12 (×17): 40 mg via INTRAVENOUS
  Filled 2024-10-03 (×17): qty 10

## 2024-10-03 MED ORDER — ALBUMIN HUMAN 25 % IV SOLN
25.0000 g | INTRAVENOUS | Status: AC
  Administered 2024-10-04: 25 g via INTRAVENOUS
  Filled 2024-10-03: qty 100

## 2024-10-03 MED ORDER — SODIUM CHLORIDE 0.9 % IV SOLN: 250.0000 mL | INTRAVENOUS | Status: AC | PRN

## 2024-10-03 MED ORDER — SODIUM CHLORIDE 0.9% IV SOLUTION: Freq: Once | INTRAVENOUS | Status: DC

## 2024-10-03 MED ORDER — NICOTINE 14 MG/24HR TD PT24
14.0000 mg | MEDICATED_PATCH | Freq: Every day | TRANSDERMAL | Status: DC
  Administered 2024-10-04 – 2024-10-12 (×9): 14 mg via TRANSDERMAL
  Filled 2024-10-03 (×9): qty 1

## 2024-10-03 MED ORDER — IPRATROPIUM-ALBUTEROL 0.5-2.5 (3) MG/3ML IN SOLN
3.0000 mL | Freq: Once | RESPIRATORY_TRACT | Status: AC
  Administered 2024-10-03: 3 mL via RESPIRATORY_TRACT
  Filled 2024-10-03: qty 3

## 2024-10-03 MED ORDER — METOPROLOL SUCCINATE ER 25 MG PO TB24: 50.0000 mg | ORAL_TABLET | Freq: Every day | ORAL | Status: DC

## 2024-10-03 MED ORDER — SODIUM CHLORIDE 0.9% FLUSH: 3.0000 mL | INTRAVENOUS | Status: DC | PRN

## 2024-10-03 MED ORDER — SODIUM CHLORIDE 0.9 % IV SOLN
2.0000 g | Freq: Once | INTRAVENOUS | Status: AC
  Administered 2024-10-03: 2 g via INTRAVENOUS
  Filled 2024-10-03: qty 20

## 2024-10-03 MED ORDER — ONDANSETRON HCL 4 MG/2ML IJ SOLN
4.0000 mg | Freq: Four times a day (QID) | INTRAMUSCULAR | Status: DC | PRN
  Administered 2024-10-04 – 2024-10-09 (×4): 4 mg via INTRAVENOUS
  Filled 2024-10-03 (×4): qty 2

## 2024-10-03 MED ORDER — ALLOPURINOL 100 MG PO TABS: 100.0000 mg | ORAL_TABLET | Freq: Every day | ORAL | Status: DC

## 2024-10-03 MED ORDER — METOLAZONE 2.5 MG PO TABS: 2.5000 mg | ORAL_TABLET | Freq: Every day | ORAL | Status: DC

## 2024-10-03 MED ORDER — ACETAMINOPHEN 650 MG RE SUPP: 650.0000 mg | Freq: Four times a day (QID) | RECTAL | Status: DC | PRN

## 2024-10-03 MED ORDER — ISOSORB DINITRATE-HYDRALAZINE 20-37.5 MG PO TABS
1.0000 | ORAL_TABLET | Freq: Three times a day (TID) | ORAL | Status: DC
  Administered 2024-10-04 – 2024-10-06 (×9): 1 via ORAL
  Filled 2024-10-03 (×9): qty 1

## 2024-10-03 MED ORDER — SODIUM CHLORIDE 0.9 % IV SOLN
1.0000 g | INTRAVENOUS | Status: DC
  Administered 2024-10-04 – 2024-10-06 (×3): 1 g via INTRAVENOUS
  Filled 2024-10-03 (×3): qty 10

## 2024-10-03 MED ORDER — ROSUVASTATIN CALCIUM 20 MG PO TABS
40.0000 mg | ORAL_TABLET | Freq: Every day | ORAL | Status: DC
  Administered 2024-10-04 – 2024-10-12 (×9): 40 mg via ORAL
  Filled 2024-10-03 (×9): qty 2

## 2024-10-03 MED ORDER — METHYLPREDNISOLONE SODIUM SUCC 125 MG IJ SOLR
125.0000 mg | Freq: Once | INTRAMUSCULAR | Status: AC
  Administered 2024-10-03: 125 mg via INTRAVENOUS
  Filled 2024-10-03: qty 2

## 2024-10-03 MED ORDER — INSULIN ASPART 100 UNIT/ML IJ SOLN
0.0000 [IU] | Freq: Three times a day (TID) | INTRAMUSCULAR | Status: DC
  Administered 2024-10-04: 11 [IU] via SUBCUTANEOUS
  Administered 2024-10-04: 15 [IU] via SUBCUTANEOUS
  Administered 2024-10-04: 11 [IU] via SUBCUTANEOUS
  Administered 2024-10-05: 3 [IU] via SUBCUTANEOUS
  Administered 2024-10-05: 8 [IU] via SUBCUTANEOUS
  Administered 2024-10-05: 15 [IU] via SUBCUTANEOUS
  Administered 2024-10-06 (×2): 8 [IU] via SUBCUTANEOUS
  Administered 2024-10-06 – 2024-10-07 (×3): 5 [IU] via SUBCUTANEOUS
  Administered 2024-10-07: 8 [IU] via SUBCUTANEOUS
  Administered 2024-10-08: 5 [IU] via SUBCUTANEOUS
  Administered 2024-10-08 (×2): 3 [IU] via SUBCUTANEOUS
  Administered 2024-10-09: 5 [IU] via SUBCUTANEOUS
  Administered 2024-10-09: 8 [IU] via SUBCUTANEOUS
  Administered 2024-10-10: 5 [IU] via SUBCUTANEOUS
  Administered 2024-10-10: 2 [IU] via SUBCUTANEOUS
  Administered 2024-10-11: 3 [IU] via SUBCUTANEOUS
  Administered 2024-10-11: 5 [IU] via SUBCUTANEOUS
  Administered 2024-10-11: 2 [IU] via SUBCUTANEOUS
  Administered 2024-10-12: 5 [IU] via SUBCUTANEOUS
  Administered 2024-10-12: 3 [IU] via SUBCUTANEOUS
  Filled 2024-10-03: qty 1
  Filled 2024-10-03: qty 3
  Filled 2024-10-03: qty 15
  Filled 2024-10-03: qty 8
  Filled 2024-10-03: qty 1
  Filled 2024-10-03: qty 11
  Filled 2024-10-03 (×4): qty 1
  Filled 2024-10-03: qty 8
  Filled 2024-10-03: qty 3
  Filled 2024-10-03: qty 8
  Filled 2024-10-03: qty 1
  Filled 2024-10-03: qty 5
  Filled 2024-10-03 (×3): qty 1
  Filled 2024-10-03: qty 15
  Filled 2024-10-03 (×3): qty 1
  Filled 2024-10-03: qty 11
  Filled 2024-10-03: qty 5

## 2024-10-03 MED ORDER — IPRATROPIUM-ALBUTEROL 0.5-2.5 (3) MG/3ML IN SOLN
3.0000 mL | Freq: Four times a day (QID) | RESPIRATORY_TRACT | Status: DC
  Administered 2024-10-04 – 2024-10-09 (×18): 3 mL via RESPIRATORY_TRACT
  Filled 2024-10-03 (×19): qty 3

## 2024-10-03 MED ORDER — FUROSEMIDE 10 MG/ML IJ SOLN
120.0000 mg | Freq: Once | INTRAVENOUS | Status: AC
  Administered 2024-10-04: 120 mg via INTRAVENOUS
  Filled 2024-10-03: qty 10

## 2024-10-03 MED ORDER — IPRATROPIUM-ALBUTEROL 0.5-2.5 (3) MG/3ML IN SOLN
3.0000 mL | RESPIRATORY_TRACT | Status: DC | PRN
  Administered 2024-10-08: 3 mL via RESPIRATORY_TRACT
  Filled 2024-10-03: qty 3

## 2024-10-03 MED ORDER — ONDANSETRON HCL 4 MG PO TABS: 4.0000 mg | ORAL_TABLET | Freq: Four times a day (QID) | ORAL | Status: DC | PRN

## 2024-10-03 MED ORDER — BUPROPION HCL ER (XL) 150 MG PO TB24
300.0000 mg | ORAL_TABLET | Freq: Every morning | ORAL | Status: DC
  Filled 2024-10-03: qty 2

## 2024-10-03 MED ORDER — METOLAZONE 2.5 MG PO TABS: ORAL_TABLET | ORAL | 0 refills | Status: DC

## 2024-10-03 MED ORDER — SODIUM CHLORIDE 0.9% FLUSH
3.0000 mL | Freq: Two times a day (BID) | INTRAVENOUS | Status: DC
  Administered 2024-10-04 – 2024-10-12 (×16): 3 mL via INTRAVENOUS

## 2024-10-03 MED ORDER — ACETAMINOPHEN 325 MG PO TABS
650.0000 mg | ORAL_TABLET | Freq: Four times a day (QID) | ORAL | Status: DC | PRN
Start: 2024-10-03 — End: 2024-10-12
  Administered 2024-10-04 – 2024-10-10 (×6): 650 mg via ORAL
  Filled 2024-10-03 (×6): qty 2

## 2024-10-03 MED ORDER — ISOSORB DINITRATE-HYDRALAZINE 20-37.5 MG PO TABS: 1.0000 | ORAL_TABLET | Freq: Three times a day (TID) | ORAL | Status: DC

## 2024-10-03 MED ORDER — AMIODARONE HCL 100 MG PO TABS
100.0000 mg | ORAL_TABLET | Freq: Every day | ORAL | Status: DC
  Administered 2024-10-04 – 2024-10-06 (×3): 100 mg via ORAL
  Filled 2024-10-03 (×3): qty 1

## 2024-10-03 MED ORDER — SODIUM CHLORIDE 0.9 % IV SOLN
500.0000 mg | INTRAVENOUS | Status: DC
  Administered 2024-10-04 (×2): 500 mg via INTRAVENOUS
  Filled 2024-10-03 (×2): qty 5

## 2024-10-03 NOTE — Telephone Encounter (Signed)
  Cardiomems Remote Monitoring  S/P Cardiomems Implant 02/05/20  PAD Goal: 20 Most recent reading: 31 indicating fluid retention  Recommended changes: Discussed with MARLA Quivers paramedic.  Per K Lynch, abdomen does look bloated and legs are more swollen. Patient reports stomach pain, black stools yesterday and has a hx of GIB. She provided new scales to patient and today's weight is 232 pounds. Last weight entered in EPIC was 2 days ago and it was 202 (although question accuracy).   With possible 30 # weight gain, worsening symptoms and possible GI bleeding, have advised K Lynch to have patient go to the ER for further evaluation. She says that patient's daughter will take her. Have sent in metolazone  so that patient can have it on hand as K Lynch does her medication box for her.   I continue to review and analyze the patients PA pressures weekly (and more often as needed) to bring PA pressures within the optimal range.     [name] Class FNP-C 10/03/24

## 2024-10-03 NOTE — TOC CM/SW Note (Signed)
 TOC consult for possible HH/CHF needs. Per chart, patient is currently followed closely by the CHF clinic and the Paramedicine program. CM to assess for further needs as appropriate.   Merilee Batty, MSN, RN Case Management [phone]

## 2024-10-03 NOTE — ED Triage Notes (Signed)
 Pt. Stated, [name] had stomach pain but a lot of indigestion for the last 2 weeks. Ive a stool with blood in there.

## 2024-10-03 NOTE — H&P (Signed)
 History and Physical    [name] FMW:[id number] DOB: [birth year] DOA: 10/03/2024  PCP: Campbell Reynolds, NP   Patient coming from: Home   Chief Complaint:  Chief Complaint  Patient presents with   Abdominal Pain   Blood In Stools   Gastroesophageal Reflux   ED TRIAGE note:  HPI:  [name] is a 63 y.o. female with medical history significant of GI bleed secondary to [facility] powder (previous EGD and colonoscopy unremarkable), paroxysmal atrial fibrillation on Eliquis , CKD stage IV, HFrEF  35 to 40%, CAD, hyperlipidemia, carotid stenosis status post CEA, peripheral artery disease, tobacco abuse, hypothyroidism, COPD, and gout presented to emergency department with multiple complaints include epigastric pain, heartburn, 1-2 episodes of black tarry stool, progressive swelling of the bilateral lower extremities with associated weight gain around 30 pounds even though she has been compliant with medications. Patient denies any abdominal pain, nausea and vomiting. Patient also endorsing recently worsening bilateral lower extremity swelling orthopnea, dyspnea and paroxysmal nocturnal dyspnea.  Denies any chest pain and palpitation. Patient also complaining about nonproductive cough and wheezing on exertion.  On chart review, admitted 03/08/24 - 03/17/24 with chest pain and melena. Pt had been using BC powder, Hgb was 5.3 on admit. Endoscopy was negative for source of bleeding. Colonoscopy showed multiple polyps. Seen in [facility] Clinic 04/29/24 and was doing well. No further bleeding, back on ELiquis  and Hgb up to 8gm.   ED Course:  At presentation to ED patient is hemodynamically stable. Lab, stable H&H 8.6 and 29 normal WBC platelet count. CMP showing creatinine 2.46 BUN 98 and GFR 22.  Renal function at baseline.  Elevated anion gap 17.  Normal hepatic function panel and normal lipase level. Pending BNP.  UA showing evidence of UTI.  Chest x-ray showed mild cardiomegaly, pulmonary vascular  congestion and bibasilar atelectasis.  In the ED patient received Lasix  40 mg and ceftriaxone  2 g.  Also received multiple doses of DuoNeb and Solu-Medrol  an IV Protonix  40 mg. ED physician reported that rectal exam showed black-colored stool in the rectal vault.  Hospitalist consulted for further evaluation management of acute on chronic CHF exacerbation, COPD exacerbation, acute cystitis and GI bleed.   Significant labs in the ED: Lab Orders         Respiratory (~20 pathogens) panel by PCR         Urine Culture (for pregnant, neutropenic or urologic patients or patients with an indwelling urinary catheter)         Lipase, blood         Comprehensive metabolic panel         CBC         Urinalysis, Routine w reflex microscopic -Urine, Clean Catch         Brain natriuretic peptide         Brain natriuretic peptide         Hemoglobin and hematocrit, blood         HIV Antibody (routine testing w rflx)         Comprehensive metabolic panel         CBC         CBG monitoring, ED       Review of Systems:  Review of Systems  Constitutional:  Negative for chills, fever, malaise/fatigue and weight loss.  Respiratory:  Positive for cough, shortness of breath and wheezing. Negative for sputum production.   Cardiovascular:  Positive for orthopnea, leg swelling and PND. Negative for chest  pain and palpitations.  Gastrointestinal:  Positive for melena. Negative for abdominal pain, heartburn, nausea and vomiting.  Genitourinary:  Positive for dysuria. Negative for flank pain, frequency, hematuria and urgency.  Musculoskeletal:  Negative for back pain, myalgias and neck pain.  Neurological:  Negative for dizziness and headaches.  Psychiatric/Behavioral:  The patient is not nervous/anxious.     Past Medical History:  Diagnosis Date   Acute renal failure    Acute respiratory failure with hypoxia ([name]) 12/02/2019   Alcohol abuse    Alcoholic cirrhosis ([name])    Anemia    Anxiety     Arthritis    knees (11/26/2018)   B12 deficiency    CAD (coronary artery disease)    a. 11/10/2014 Cath: LM nl, LAD min irregs, D1 30 ost, D2 50d, LCX 65m, OM1 80 p/m (1.5 mm vessel), OM2 85m, RCA nondom 17m-->med rx.. Demand ischemia in the setting of rapid a-fib.   Cardiomyopathy ([name])    Carotid artery disease    a. 01/2015 Carotid Angio: RICA 100, LICA 95p; b. 02/2015 s/p L CEA; c. 05/2019 Carotid U/S: RICA 100. RECA >50. LICA 1-39%.   Cellulitis    lower extremities   Cellulitis in diabetic foot ([name]) 07/08/2019   CHF (congestive heart failure) ([name])    Chronic combined systolic and diastolic CHF (congestive heart failure) ([name])    a. 10/2014 Echo: EF 40-45%; b. 10/2018 Echo: EF 45-50%, gr2 DD; c. 11/2019 Echo: EF 50%, mild LVH, gr2 DD (restrictive), antlat HK, Nl RV fxn. Mild BAE. RVSP 59.108mmHg.   CKD (chronic kidney disease), stage III ([name])    Cocaine abuse ([name])    COPD (chronic obstructive pulmonary disease) ([name])    Critical ischemia of foot ([name]) 06/07/2021   Critical limb ischemia     Critical lower limb ischemia ([name])    Demand ischemia ([name]) 10/29/2014   Depression    Diabetes mellitus without complication ([name])    Diabetic peripheral neuropathy ([name])    DVT (deep venous thrombosis) ([name])    Dyspnea    Elevated troponin    a. Chronic elevation.   Fall 05/05/2021   Femoro-popliteal artery disease    Peripheral arterial disease/critical limb ischemia     GERD (gastroesophageal reflux disease)    Hyperlipemia    Hypertension    Hypokalemia    Hypomagnesemia    Hypothyroidism    Marijuana abuse    Narcotic abuse ([name])    Noncompliance    NSVT (nonsustained ventricular tachycardia) ([name])    Obesity    Osteomyelitis ([name]) 06/21/2019   PAF (paroxysmal atrial fibrillation) ([name])    Paroxysmal atrial tachycardia    Peripheral arterial disease    a. 01/2015 Angio/PTA: RSFA 99 (atherectomy/pta) - 1 vessel runoff via diff dzs peroneal; b. 06/2019 s/p L fem to ant tib  bypass & L 5th toe ray amputation.   Pneumonia    once or twice (11/26/2018)   Poorly controlled type 2 diabetes mellitus ([name])    Renal disorder    Renal insufficiency    a. 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   SIRS (systemic inflammatory response syndrome) ([name]) 04/06/2017   Sleep apnea    couldn't handle wearing the mask (11/26/2018)   Symptomatic bradycardia    a. Avoid AV blocking agent per EP. Prev req temp wire in 2017.   Tobacco abuse    Wrist fracture, left, closed, initial encounter 01/29/2015    Past Surgical History:  Procedure Laterality Date   ABDOMINAL AORTOGRAM N/A 06/26/2019  Procedure: ABDOMINAL AORTOGRAM;  Surgeon: Darron Deatrice LABOR, MD;  Location: [facility] INVASIVE CV LAB;  Service: Cardiovascular;  Laterality: N/A;   ABDOMINAL AORTOGRAM W/LOWER EXTREMITY N/A 06/07/2021   Procedure: ABDOMINAL AORTOGRAM W/LOWER EXTREMITY;  Surgeon: Court Dorn PARAS, MD;  Location: [facility];  Service: Cardiovascular;  Laterality: N/A;   ABDOMINAL AORTOGRAM W/LOWER EXTREMITY N/A 08/11/2023   Procedure: ABDOMINAL AORTOGRAM W/LOWER EXTREMITY;  Surgeon: Magda Debby SAILOR, MD;  Location: [facility];  Service: Cardiovascular;  Laterality: N/A;   AMPUTATION Right 06/14/2017   Procedure: Right foot transmetatarsal amputation;  Surgeon: Harden Jerona GAILS, MD;  Location: [facility] OR;  Service: Orthopedics;  Laterality: Right;   AMPUTATION Left 06/28/2019   Procedure: AMPUTATION LEFT FIFTH TOE;  Surgeon: Oris Krystal FALCON, MD;  Location: [facility] OR;  Service: Vascular;  Laterality: Left;   AMPUTATION TOE Right 04/28/2017   Procedure: AMPUTATION OF RIGHT SECOND RAY;  Surgeon: Harden Jerona GAILS, MD;  Location: [facility] OR;  Service: Orthopedics;  Laterality: Right;   CARDIAC CATHETERIZATION     CARDIAC CATHETERIZATION N/A 01/12/2016   Procedure: Temporary Wire;  Surgeon: Lynwood Schilling, MD;  Location: [facility];  Service: Cardiovascular;  Laterality: N/A;   CARDIOVERSION  ~ 02/2013   twice    CARDIOVERSION N/A  09/26/2023   Procedure: CARDIOVERSION (CATH LAB);  Surgeon: Alvan Ronal BRAVO, MD;  Location[facility] INVASIVE CV LAB;  Service: Cardiovascular;  Laterality: N/A;   CAROTID ANGIOGRAM N/A 01/15/2015   Procedure: CAROTID ANGIOGRAM;  Surgeon: Dorn PARAS Court, MD;  Location: [facility] CATH LAB;  Service: Cardiovascular;  Laterality: N/A;   COLONOSCOPY N/A 03/14/2024   Procedure: COLONOSCOPY;  Surgeon: Albertus Gordy HERO, MD;  Location: [facility] ENDOSCOPY;  Service: Gastroenterology;  Laterality: N/A;   DILATION AND CURETTAGE OF UTERUS  1988   ENDARTERECTOMY Left 02/19/2015   Procedure: LEFT CAROTID ENDARTERECTOMY ;  Surgeon: Gaile LELON New, MD;  Location: [facility] OR;  Service: Vascular;  Laterality: Left;   ESOPHAGOGASTRODUODENOSCOPY N/A 03/13/2024   Procedure: EGD (ESOPHAGOGASTRODUODENOSCOPY);  Surgeon: Albertus Gordy HERO, MD;  Location: [facility] ENDOSCOPY;  Service: Gastroenterology;  Laterality: N/A;   FEMORAL-TIBIAL BYPASS GRAFT Left 06/28/2019   Procedure: BYPASS GRAFT LEFT LEG FEMORAL TO ANTERIOR TIBIAL ARTERY using LEFT GREATER SAPHENOUS VEIN;  Surgeon: Oris Krystal FALCON, MD;  Location: [facility] OR;  Service: Vascular;  Laterality: Left;   LEFT HEART CATHETERIZATION WITH CORONARY ANGIOGRAM N/A 10/31/2014   Procedure: LEFT HEART CATHETERIZATION WITH CORONARY ANGIOGRAM;  Surgeon: Lonni JONETTA Cash, MD;  Location: [facility] CATH LAB;  Service: Cardiovascular;  Laterality: N/A;   LOWER EXTREMITY ANGIOGRAM N/A 09/10/2013   Procedure: LOWER EXTREMITY ANGIOGRAM;  Surgeon: Dorn PARAS Court, MD;  Location: [facility] CATH LAB;  Service: Cardiovascular;  Laterality: N/A;   LOWER EXTREMITY ANGIOGRAM N/A 01/15/2015   Procedure: LOWER EXTREMITY ANGIOGRAM;  Surgeon: Dorn PARAS Court, MD;  Location: [facility] CATH LAB;  Service: Cardiovascular;  Laterality: N/A;   LOWER EXTREMITY ANGIOGRAPHY N/A 04/13/2017   Procedure: Lower Extremity Angiography;  Surgeon: Court Dorn PARAS, MD;  Location: [facility] INVASIVE CV LAB;  Service: Cardiovascular;  Laterality: N/A;   LOWER EXTREMITY ANGIOGRAPHY  Left 06/26/2019   Procedure: LOWER EXTREMITY ANGIOGRAPHY;  Surgeon: Darron Deatrice LABOR, MD;  Location: [facility];  Service: Cardiovascular;  Laterality: Left;   PERIPHERAL VASCULAR ATHERECTOMY Right 06/07/2021   Procedure: PERIPHERAL VASCULAR ATHERECTOMY;  Surgeon: Court Dorn PARAS, MD;  Location: [facility] INVASIVE CV LAB;  Service: Cardiovascular;  Laterality: Right;   PERIPHERAL VASCULAR BALLOON ANGIOPLASTY Left  06/26/2019   Procedure: PERIPHERAL VASCULAR BALLOON ANGIOPLASTY;  Surgeon: Darron Deatrice LABOR, MD;  Location: [facility];  Service: Cardiovascular;  Laterality: Left;  unable to cross lt sfa occlusion   PERIPHERAL VASCULAR INTERVENTION  04/13/2017   Procedure: Peripheral Vascular Intervention;  Surgeon: Court Dorn PARAS, MD;  Location: [facility] INVASIVE CV LAB;  Service: Cardiovascular;;   PERIPHERAL VASCULAR INTERVENTION  08/11/2023   Procedure: PERIPHERAL VASCULAR INTERVENTION;  Surgeon: Magda Debby SAILOR, MD;  Location: [facility];  Service: Cardiovascular;;   PRESSURE SENSOR/CARDIOMEMS N/A 02/05/2020   Procedure: PRESSURE SENSOR/CARDIOMEMS;  Surgeon: Rolan Ezra RAMAN, MD;  Location: [facility] INVASIVE CV LAB;  Service: Cardiovascular;  Laterality: N/A;   VEIN HARVEST Left 06/28/2019   Procedure: LEFT LEG GREATER SAPHENOUS VEIN HARVEST;  Surgeon: Oris Krystal FALCON, MD;  Location: [facility] OR;  Service: Vascular;  Laterality: Left;     reports that she has been smoking e-cigarettes and cigarettes. She has a 44 pack-year smoking history. She has quit using smokeless tobacco.  Her smokeless tobacco use included snuff. She reports that she does not currently use alcohol. She reports current drug use. Drugs: Crack cocaine, Marijuana, and Oxycodone .  Allergies  Allergen Reactions   Neurontin  [Gabapentin ] Nausea And Vomiting and Other (See Comments)    POSSIBLE SHAKING   Lyrica  [Pregabalin ] Other (See Comments)    Shaking     Family History  Problem Relation Age of Onset   Hypertension  Mother    Diabetes Mother    Cancer Mother        breast, ovarian, colon   Clotting disorder Mother    Heart disease Mother    Heart attack Mother    Breast cancer Mother        in 36's   Hypertension Father    Heart disease Father    Emphysema Sister        smoked    Prior to Admission medications   Medication Sig Start Date End Date Taking? Authorizing Provider  acetaminophen  (TYLENOL ) 500 MG tablet Take 1,000 mg by mouth every 6 (six) hours as needed for headache (pain).   Yes [provider]  albuterol  (VENTOLIN  HFA) 108 (90 Base) MCG/ACT inhaler USE 2 PUFFS BY MOUTH EVERY FOUR HOURS, AS NEEDED, FOR COUGHING/WHEEZING 07/12/23  Yes Newlin, [name], MD  allopurinol  (ZYLOPRIM ) 100 MG tablet Take 100 mg by mouth daily.   Yes [provider]  amiodarone  (PACERONE ) 200 MG tablet Take 0.5 tablets (100 mg total) by mouth daily. 04/29/24  Yes Ollis, Daphne CROME, NP  budesonide -formoterol  (SYMBICORT ) 160-4.5 MCG/ACT inhaler Inhale 2 puffs into the lungs 2 (two) times daily. 07/12/23  Yes Newlin, [name], MD  buPROPion  (WELLBUTRIN  XL) 300 MG 24 hr tablet Take 300 mg by mouth every morning. 02/14/24  Yes [provider]  Cholecalciferol  (VITAMIN D3) 50 MCG (2000 UT) capsule Take 1 capsule by mouth every morning.   Yes [provider]  colchicine  0.6 MG tablet Take 1.2 mg by mouth See admin instructions. Take 1.2 mg for flair up and an hour later take 0.6 mg if needed   Yes [provider]  docusate sodium  (COLACE) 100 MG capsule Take 2 capsules (200 mg total) by mouth at bedtime. 03/17/24  Yes Regalado, Belkys A, MD  ELIQUIS  5 MG TABS tablet TAKE 1 TABLET (5 MG TOTAL) BY MOUTH 2 (TWO) TIMES DAILY. 06/10/24  Yes Clegg, [name] D, NP  folic acid  (FOLVITE ) 1 MG tablet Take 1 tablet (1 mg total) by  mouth daily. 03/17/24  Yes Regalado, Belkys A, MD  insulin  glargine (LANTUS ) 100 UNIT/ML injection Inject 58 Units into the skin at bedtime.   Yes [provider]   insulin  lispro (HUMALOG ) 100 UNIT/ML KwikPen Inject 15 Units into the skin 3 (three) times daily with meals. Plus sliding scale, max dose 70 units /day 03/17/24  Yes Regalado, Belkys A, MD  isosorbide -hydrALAZINE  (BIDIL ) 20-37.5 MG tablet Take 1 tablet by mouth 3 (three) times daily. 05/21/24  Yes Milford, Harlene HERO, FNP  levothyroxine  (SYNTHROID ) 50 MCG tablet TAKE 1 TABLET (50 MCG TOTAL) BY MOUTH DAILY BEFORE BREAKFAST (AM) 05/02/23  Yes Newlin, [name], MD  Menthol -Camphor (ICY HOT PRO NO MESS EX) Apply 1 Application topically 3 (three) times daily as needed (arms, neuropathy lower extrimity, gout in hand.).   Yes [provider]  metolazone  (ZAROXOLYN ) 2.5 MG tablet Take 2.5mg  metolazone  every other day X 3 doses. Then PRN when advised by provider 10/03/24  Yes [place] A, FNP  metoprolol  succinate (TOPROL -XL) 50 MG 24 hr tablet Take 1 tablet (50 mg total) by mouth daily. 08/19/24  Yes Rolan Ezra RAMAN, MD  NYAMYC  powder Apply 1 Application topically 2 (two) times daily. 02/09/24  Yes [provider]  nystatin  cream (MYCOSTATIN ) Apply 1 Application topically 2 (two) times daily as needed for dry skin. 02/09/24  Yes [provider]  Omega-3 Fatty Acids (FISH OIL PO) Take 1 tablet by mouth daily.   Yes [provider]  pantoprazole  (PROTONIX ) 40 MG tablet TAKE 1 TABLET (40 MG TOTAL) BY MOUTH DAILY(AM) 05/02/23  Yes Newlin, [name], MD  polyethylene glycol (MIRALAX  / GLYCOLAX ) 17 g packet Take 17 g by mouth daily as needed for moderate constipation. 08/05/23  Yes Akula, Vijaya, MD  potassium chloride  SA (KLOR-CON  M) 20 MEQ tablet Take 2 tablets (40 mEq total) by mouth every morning AND 1 tablet (20 mEq total) every evening. 08/19/24  Yes Rolan Ezra RAMAN, MD  rosuvastatin  (CRESTOR ) 40 MG tablet Take 1 tablet (40 mg total) by mouth daily. 04/04/24  Yes Rolan Ezra RAMAN, MD  tirzepatide  (MOUNJARO ) 7.5 MG/0.5ML Pen Inject 7.5 mg into the skin once a week. Patient taking  differently: Inject 7.5 mg into the skin every Friday. 05/22/24  Yes Motwani, Komal, MD  torsemide  (DEMADEX ) 20 MG tablet Take 3 tablets (60 mg total) by mouth every morning AND 2 tablets (40 mg total) every evening. 09/16/24 12/15/24 Yes Hayes Beckey CROME, NP  VITAMIN E PO Take 1 tablet by mouth daily.   Yes [provider]  Continuous Glucose Receiver (DEXCOM G7 RECEIVER) DEVI 1 Device by Does not apply route continuous. 06/25/24   Motwani, Komal, MD  Continuous Glucose Sensor (DEXCOM G7 SENSOR) MISC 1 Device by Does not apply route continuous. 06/25/24   Motwani, Komal, MD  Glucagon  (BAQSIMI  ONE PACK) 3 MG/DOSE POWD Place 1 Device into the nose as needed (Low blood sugar with impaired consciousness). 05/22/24   Motwani, Komal, MD  tiotropium (SPIRIVA  HANDIHALER) 18 MCG inhalation capsule Place 1 capsule (18 mcg total) into inhaler and inhale every morning. 12/09/19 02/09/21  Sebastian Toribio GAILS, MD     Physical Exam: Vitals:   10/03/24 2231 10/03/24 2300 10/04/24 0200 10/04/24 0231  BP: 117/66 (!) 142/87 104/77   Pulse: (!) 56 (!) 58 68   Resp: 20 17 15    Temp: 97.9 F (36.6 C)   97.7 F (36.5 C)  TempSrc: Oral   Oral  SpO2: 100% 100% 100%  Physical Exam Constitutional:      General: She is not in acute distress.    Appearance: She is well-developed. She is ill-appearing.  Cardiovascular:     Rate and Rhythm: Normal rate and regular rhythm.  Abdominal:     General: Bowel sounds are normal. There is no distension.     Palpations: Abdomen is soft.     Tenderness: There is no abdominal tenderness. There is no guarding.  Skin:    Capillary Refill: Capillary refill takes less than 2 seconds.  Neurological:     Mental Status: She is alert and oriented to person, place, and time.      Labs on Admission: I have personally reviewed following labs and imaging studies  CBC: Recent Labs  Lab 10/03/24 1142 10/04/24 0000  WBC 5.8  --   HGB 8.6* 8.7*  HCT 29.0* 29.2*  MCV 99.3  --    PLT 220  --    Basic Metabolic Panel: Recent Labs  Lab 10/03/24 1142  NA 142  K 5.0  CL 103  CO2 22  GLUCOSE 210*  BUN 98*  CREATININE 2.46*  CALCIUM  8.4*   GFR: Estimated Creatinine Clearance: 25.8 mL/min (A) (by C-G formula based on SCr of 2.46 mg/dL (H)). Liver Function Tests: Recent Labs  Lab 10/03/24 1142  AST 17  ALT 18  ALKPHOS 94  BILITOT 0.5  PROT 6.2*  ALBUMIN  3.2*   Recent Labs  Lab 10/03/24 1142  LIPASE 34   No results for input(s): AMMONIA in the last 168 hours. Coagulation Profile: No results for input(s): INR, PROTIME in the last 168 hours. Cardiac Enzymes: No results for input(s): CKTOTAL, CKMB, CKMBINDEX, TROPONINI, TROPONINIHS in the last 168 hours. BNP (last 3 results) Recent Labs    08/19/24 1232 10/03/24 1142 10/04/24 0000  BNP 418.1* 386.8* 405.1*   HbA1C: No results for input(s): HGBA1C in the last 72 hours. CBG: Recent Labs  Lab 10/04/24 0007  GLUCAP 144*   Lipid Profile: No results for input(s): CHOL, HDL, LDLCALC, TRIG, CHOLHDL, LDLDIRECT in the last 72 hours. Thyroid  Function Tests: No results for input(s): TSH, T4TOTAL, FREET4, T3FREE, THYROIDAB in the last 72 hours. Anemia Panel: No results for input(s): VITAMINB12, FOLATE, FERRITIN, TIBC, IRON , RETICCTPCT in the last 72 hours. Urine analysis:    Component Value Date/Time   COLORURINE YELLOW 10/03/2024 2033   APPEARANCEUR HAZY (A) 10/03/2024 2033   LABSPEC 1.011 10/03/2024 2033   PHURINE 5.0 10/03/2024 2033   GLUCOSEU NEGATIVE 10/03/2024 2033   HGBUR NEGATIVE 10/03/2024 2033   BILIRUBINUR NEGATIVE 10/03/2024 2033   KETONESUR NEGATIVE 10/03/2024 2033   PROTEINUR NEGATIVE 10/03/2024 2033   UROBILINOGEN 0.2 09/05/2015 0012   NITRITE POSITIVE (A) 10/03/2024 2033   LEUKOCYTESUR LARGE (A) 10/03/2024 2033    Radiological Exams on Admission: I have personally reviewed images DG Chest Portable 1 View Result Date:  10/03/2024 CLINICAL DATA:  Shortness of breath with abdominal pain and bloody stool. EXAM: PORTABLE CHEST 1 VIEW COMPARISON:  April 24, 2024 FINDINGS: The cardiac silhouette is mildly enlarged and unchanged in size. There is mild prominence of the pulmonary vasculature. Mild atelectasis is suspected within the right lung base. No acute infiltrate, pleural effusion or pneumothorax is identified. The visualized skeletal structures are unremarkable. IMPRESSION: 1. Mild cardiomegaly and mild pulmonary vascular congestion. 2. Mild right basilar atelectasis. Electronically Signed   By: Suzen Dials M.D.   On: 10/03/2024 21:09     EKG: My personal interpretation of EKG shows: Normal  sinus rhythm heart rate 62.   Assessment/Plan: Principal Problem:   GI bleed Active Problems:   Acute CHF (congestive heart failure) ([name])   Melena   Acute cystitis   Paroxysmal atrial fibrillation ([name])   CKD (chronic kidney disease), stage IV ([name])   Essential hypertension   Insulin  dependent type 2 diabetes mellitus ([name])   Peripheral artery disease   Hyperlipidemia   Hypothyroidism   History of CAD (coronary artery disease)   GAD (generalized anxiety disorder)   COPD with acute exacerbation ([name])   History of CEA (carotid endarterectomy)   Gout    Assessment and Plan: GI bleeding- Melena -Patient presented to emergency department complaining of midepigastric pain heartburn and 2 episodes of black tarry stool at home today.  Patient has similar presentation in May 2025 was admitted for GI bleed underwent EGD and colonoscopy unremarkable except colonoscopy showed polyp.  At presentation to ED today patient is hemodynamically stable. -Lab, showed stable H&H 8.6 and 29 (baseline hemoglobin around 7-8.  Normal WBC platelet count - ED physician reported that physical exam showed black tarry stool in the rectal vault. - Previously patient being evaluated by [facility].  Given patient is hemodynamically stable  informed on-call about GI Dr. Suzann to evaluate in the daytime. -Continue to check H&H.  Preparing 1 unit of blood goal to transfuse if hemoglobin drop below 8. - Continue IV Protonix  40 mg twice daily.  Acute CHF exacerbation Essential hypertension HFrEF 35 to 40% -Patient reported bilateral lower extremity swelling and 30 pounds weight gain even though she has been compliant with torsemide . - Chest x-ray showing pulmonary vascular congestion and cardiomegaly. -Pending BNP - At home patient is on torsemide  60 mg twice daily. - In the ED patient received Lasix  120 mg. - Continue IV Lasix  80 mg twice daily. -Continue BiDil  and Toprol -XL - Strict I's/O, daily weight and monitor urine output - Obtain echocardiogram.  Continue cardiac monitoring.   Acute COPD exacerbation -Physical exam showing bilateral wheezing and patient is complaining about nonproductive cough. - In the ED patient has been received multiple doses of DuoNeb and Solu-Medrol . -Continue IV Solu-Medrol , DuoNebs scheduled and as needed.  Continue supportive care.  Continue azithromycin .   Acute cystitis -UA showing evidence of UTI.  Patient is complaining about dysuria. - Continue IV ceftriaxone  1 g daily.  Waiting urine culture  Paroxysmal atrial fibrillation -Continue Toprol -XL 50 mg daily.  Holding Eliquis  in the setting of GI bleed.  Hyperlipidemia -Continue Crestor   Hypothyroidism -Continue thyroxine  History of CAD -Continue Crestor  and Toprol -XL.  History of gout -Holding allopurinol  in the setting of advanced CKD.  History of carotid artery endarterectomy -Continue Crestor .  History of chronic tobacco smoking - Continue nicotine  patch.  Counseled patient for smoking cessation  CKD stage IV -Stable renal function.  Continue to monitor  Generalized anxiety disorder -Continue Wellbutrin  XL   DVT prophylaxis:  SCDs.  Eliquis  on hold due to GI bleed Code Status:  Full Code Diet: Heart healthy  carb modified diet Family Communication:   Family was present at bedside, at the time of interview. Opportunity was given to ask question and all questions were answered satisfactorily.  Disposition Plan: Continue monitor improvement of volume status, H&H and development of massive GI bleed/hemodynamic instability. Consults: Gastroenterology Admission status:   Inpatient, Step Down Unit  Severity of Illness: The appropriate patient status for this patient is INPATIENT. Inpatient status is judged to be reasonable and necessary in order to provide the required  intensity of service to ensure the patient's safety. The patient's presenting symptoms, physical exam findings, and initial radiographic and laboratory data in the context of their chronic comorbidities is felt to place them at high risk for further clinical deterioration. Furthermore, it is not anticipated that the patient will be medically stable for discharge from the hospital within 2 midnights of admission.   * I certify that at the point of admission it is my clinical judgment that the patient will require inpatient hospital care spanning beyond 2 midnights from the point of admission due to high intensity of service, high risk for further deterioration and high frequency of surveillance required.DEWAINE    Elton Heid, MD [facility]  How to contact the [facility] Attending or Consulting provider 7A - 7P or covering provider during after hours 7P -7A, for this patient.  Check the care team in [facility] and look for a) attending/consulting [facility] provider listed and b) the [facility] team listed Log into [email or web address] and use [facility] universal password to access. If you do not have the password, please contact the hospital operator. Locate the [facility] provider you are looking for under [facility] and page to a number that you can be directly reached. If you still have difficulty reaching the provider, please page the [facility] (Director on Call) for  the Hospitalists listed on amion for assistance.  10/04/2024, 2:34 AM

## 2024-10-03 NOTE — Progress Notes (Signed)
 Paramedicine Encounter    Patient ID: [name], female    DOB: 1961-01-31, 63 y.o.   MRN: [id number]   I came out here for med rec however the doc has not sent the amio to the pharmacy yet so that could not be done.   Tina sent me message ref her cardiomems being elevated.  So upon assessment she reports having abd pain for the past few days and she had a large BM yesterday that was like black tar and she is nauseated today still.  Her abd appears bloated, she does have more swelling to her legs.  The last couple wks her weight has been trending down  213>208>202.  Her mounjaro  was increased and she has been having smaller portion sized meals.  So I got her to weight today however the scales are not working properly and showing her to be like 160lbs.  Her sister said that someone hit the scales with a wheelchair and possibly broke them.  I got on it and it weighed me wrong too.  So I had extra set of scales in car so I got those and was expecting a couple lb difference- I got on them and it was accurate for my weight and when she got on there it showed her weighing at 232. So a huge difference from the other scales. And I get her to weigh while I am there so I can verify those numbers.  She denies increased sob, no dizziness,  She didn't want to go to hosp, but I advised her that she is on blood thinner and she has had GI bleed before with very similar symptoms and that she needs to go to ER.  She agreed to go and her sister will take her.   I will see her on Monday for f/u.   Also,the pharmacy has not received the amio yet, so I sent the mag [facility] cardio office message ref that.    Patient Care Team: Campbell Reynolds, NP as PCP - General Court Dorn PARAS, MD as PCP - Cardiology (Cardiology) Rolan Ezra GORMAN, MD as PCP - Advanced Heart Failure (Cardiology) Inocencio Soyla Lunger, MD as PCP - Electrophysiology (Cardiology) Court Dorn PARAS, MD as Consulting Physician (Cardiology) Darlean Ozell NOVAK, MD as Consulting Physician (Pulmonary Disease) System, Provider Not In  Patient Active Problem List   Diagnosis Date Noted   Anemia of chronic renal failure 08/06/2024   Persistent atrial fibrillation ([name]) 03/15/2024   Benign neoplasm of ascending colon 03/14/2024   Benign neoplasm of transverse colon 03/14/2024   Benign neoplasm of descending colon 03/14/2024   Benign neoplasm of sigmoid colon 03/14/2024   Chronic systolic congestive heart failure ([name]) 03/12/2024   Melena 03/12/2024   Heme positive stool 03/11/2024   GI bleed 03/08/2024   History of CAD (coronary artery disease) 03/08/2024   CKD (chronic kidney disease), stage IV ([name]) 03/08/2024   Continuous dependence on cigarette smoking 03/08/2024   GAD (generalized anxiety disorder) 03/08/2024   HFrEF (heart failure with reduced ejection fraction) ([name]) 10/11/2023   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) and diastolic (congestive) heart failure ([name]) 10/10/2023   Pre-ulcerative calluses 04/25/2022   PAF (paroxysmal atrial fibrillation) ([name]) 05/05/2021   COPD (chronic obstructive pulmonary disease) ([name]) 05/05/2021   OSA (obstructive sleep apnea) 05/05/2021   Stage 3b chronic kidney disease ([name]) 05/05/2021   Pressure injury of skin 05/04/2021   Diabetic nephropathy ([name]) 01/02/2021   Low back pain 06/01/2020   Hyperlipidemia  04/03/2020   Peripheral artery disease 12/26/2019   NICM (nonischemic cardiomyopathy) ([name]) 06/20/2019   Non-healing ulcer ([name]) 06/20/2019   Coagulation disorder 08/09/2017   Depression 07/21/2017   At risk for adverse drug reaction 06/20/2017   Peripheral neuropathy 06/20/2017   S/P transmetatarsal amputation of foot, right ([name]) 06/05/2017   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) 05/19/2017   Obesity, class 2 02/24/2016   Anticoagulation management encounter 02/10/2016   Chronic sinus bradycardia 01/12/2016    Essential hypertension 12/22/2015   Demand ischemia ([name])    Acute on chronic diastolic CHF (congestive heart failure) ([name])    Symptomatic anemia 11/08/2015   Hypokalemia 11/08/2015   Tobacco abuse 10/23/2015   Coronary artery disease    DOE (dyspnea on exertion) 04/29/2015   Paroxysmal atrial fibrillation ([name]) 01/16/2015   Carotid artery stenosis 01/16/2015   Insomnia 02/03/2014   S/P peripheral artery angioplasty - TurboHawk atherectomy; R SFA 09/11/2013    Class: Acute   Leg pain, bilateral 08/19/2013   Hypothyroidism 07/31/2013   History of cocaine abuse ([name]) 06/13/2013   Long term current use of anticoagulant therapy 05/20/2013   Alcohol abuse    Narcotic abuse ([name])    Marijuana abuse    Alcoholic cirrhosis ([name])    Insulin  dependent type 2 diabetes mellitus ([name])     Current Outpatient Medications:    acetaminophen  (TYLENOL ) 500 MG tablet, Take 1,000 mg by mouth every 6 (six) hours as needed for headache (pain)., Disp: , Rfl:    albuterol  (VENTOLIN  HFA) 108 (90 Base) MCG/ACT inhaler, USE 2 PUFFS BY MOUTH EVERY FOUR HOURS, AS NEEDED, FOR COUGHING/WHEEZING, Disp: 8.5 g, Rfl: 0   allopurinol  (ZYLOPRIM ) 100 MG tablet, Take 150 mg by mouth daily., Disp: , Rfl:    amiodarone  (PACERONE ) 200 MG tablet, Take 0.5 tablets (100 mg total) by mouth daily., Disp: 30 tablet, Rfl: 1   budesonide -formoterol  (SYMBICORT ) 160-4.5 MCG/ACT inhaler, Inhale 2 puffs into the lungs 2 (two) times daily., Disp: 1 each, Rfl: 2   buPROPion  (WELLBUTRIN  XL) 300 MG 24 hr tablet, Take 300 mg by mouth every morning., Disp: , Rfl:    Cholecalciferol  (VITAMIN D3) 50 MCG (2000 UT) capsule, Take 1 capsule by mouth every morning., Disp: , Rfl:    colchicine  0.6 MG tablet, Take 1.2 mg by mouth See admin instructions. Take 1.2 mg for flair up and an hour later take 0.6 mg if needed, Disp: , Rfl:    Continuous Glucose Receiver (DEXCOM G7 RECEIVER) DEVI, 1 Device by Does not apply route continuous., Disp: 1 each, Rfl:  0   Continuous Glucose Sensor (DEXCOM G7 SENSOR) MISC, 1 Device by Does not apply route continuous., Disp: 9 each, Rfl: 3   Continuous Glucose Sensor (FREESTYLE LIBRE 3 PLUS SENSOR) MISC, as directed., Disp: , Rfl:    docusate sodium  (COLACE) 100 MG capsule, Take 2 capsules (200 mg total) by mouth at bedtime., Disp: 10 capsule, Rfl: 0   ELIQUIS  5 MG TABS tablet, TAKE 1 TABLET (5 MG TOTAL) BY MOUTH 2 (TWO) TIMES DAILY., Disp: 60 tablet, Rfl: 6   erythromycin ophthalmic ointment, Place 1 Application into the right eye 2 (two) times daily., Disp: , Rfl:    fluticasone  (FLONASE) 50 MCG/ACT nasal spray, Place 1 spray into both nostrils as needed for allergies or rhinitis., Disp: , Rfl:    folic acid  (FOLVITE ) 1 MG tablet, Take 1 tablet (1 mg total) by mouth daily., Disp: 30 tablet, Rfl:  0   gabapentin  (NEURONTIN ) 100 MG capsule, Take 1 capsule (100 mg total) by mouth 3 (three) times daily., Disp: 270 capsule, Rfl: 1   Glucagon  (BAQSIMI  ONE PACK) 3 MG/DOSE POWD, Place 1 Device into the nose as needed (Low blood sugar with impaired consciousness)., Disp: 2 each, Rfl: 3   insulin  glargine (LANTUS ) 100 UNIT/ML injection, Inject 58 Units into the skin at bedtime., Disp: , Rfl:    insulin  lispro (HUMALOG ) 100 UNIT/ML KwikPen, Inject 15 Units into the skin 3 (three) times daily with meals. Plus sliding scale, max dose 70 units /day (Patient taking differently: Inject 10 Units into the skin 3 (three) times daily with meals. Plus sliding scale, max dose 70 units /day), Disp: 25 mL, Rfl: 1   isosorbide -hydrALAZINE  (BIDIL ) 20-37.5 MG tablet, Take 1 tablet by mouth 3 (three) times daily., Disp: 270 tablet, Rfl: 3   levothyroxine  (SYNTHROID ) 50 MCG tablet, TAKE 1 TABLET (50 MCG TOTAL) BY MOUTH DAILY BEFORE BREAKFAST (AM), Disp: 30 tablet, Rfl: 0   Menthol -Camphor (ICY HOT PRO NO MESS EX), Apply 1 Application topically 3 (three) times daily as needed (arms, neuropathy lower extrimity, gout in hand.)., Disp: , Rfl:     metolazone  (ZAROXOLYN ) 2.5 MG tablet, Take 2.5mg  metolazone  every other day X 3 doses. Then PRN when advised by provider, Disp: 15 tablet, Rfl: 0   metoprolol  succinate (TOPROL -XL) 50 MG 24 hr tablet, Take 1 tablet (50 mg total) by mouth daily., Disp: 90 tablet, Rfl: 3   NYAMYC  powder, Apply 1 Application topically 2 (two) times daily., Disp: , Rfl:    nystatin  cream (MYCOSTATIN ), Apply 1 Application topically 2 (two) times daily as needed for dry skin., Disp: , Rfl:    Omega-3 Fatty Acids (FISH OIL PO), Take 1 tablet by mouth daily., Disp: , Rfl:    pantoprazole  (PROTONIX ) 40 MG tablet, TAKE 1 TABLET (40 MG TOTAL) BY MOUTH DAILY(AM), Disp: 30 tablet, Rfl: 0   polyethylene glycol (MIRALAX  / GLYCOLAX ) 17 g packet, Take 17 g by mouth daily as needed for moderate constipation., Disp: 14 each, Rfl: 0   potassium chloride  SA (KLOR-CON  M) 20 MEQ tablet, Take 2 tablets (40 mEq total) by mouth every morning AND 1 tablet (20 mEq total) every evening., Disp: 200 tablet, Rfl: 3   rosuvastatin  (CRESTOR ) 40 MG tablet, Take 1 tablet (40 mg total) by mouth daily., Disp: 90 tablet, Rfl: 3   tirzepatide  (MOUNJARO ) 7.5 MG/0.5ML Pen, Inject 7.5 mg into the skin once a week., Disp: 6 mL, Rfl: 1   torsemide  (DEMADEX ) 20 MG tablet, Take 3 tablets (60 mg total) by mouth every morning AND 2 tablets (40 mg total) every evening., Disp: , Rfl:    triamcinolone  ointment (KENALOG ) 0.1 %, Apply topically 2 (two) times daily., Disp: 454 g, Rfl: 0   VITAMIN E PO, Take 1 tablet by mouth daily., Disp: , Rfl:  Allergies  Allergen Reactions   Neurontin  [Gabapentin ] Nausea And Vomiting and Other (See Comments)    POSSIBLE SHAKING   Lyrica  [Pregabalin ] Other (See Comments)    Shaking    Other     Neurontin        Social History   Socioeconomic History   Marital status: Single    Spouse name: Not on file   Number of children: 1   Years of education: 16   Highest education level: 12th grade  Occupational History    Occupation: disabled  Tobacco Use   Smoking status: Every Day  Current packs/day: 1.00    Average packs/day: 1 pack/day for 44.0 years (44.0 ttl pk-yrs)    Types: E-cigarettes, Cigarettes   Smokeless tobacco: Former    Types: Snuff  Vaping Use   Vaping status: Former   Devices: 11/26/2018 stopped months ago  Substance and Sexual Activity   Alcohol use: Not Currently    Comment: occ   Drug use: Yes    Types: Crack cocaine, Marijuana, Oxycodone    Sexual activity: Not Currently  Other Topics Concern   Not on file  Social History Narrative   ** Merged History Encounter **       Lives in [place], in motel with sister.  They are looking to move but don't have a place to go yet.     Social Drivers of Health   Financial Resource Strain: Low Risk  (02/09/2023)   Overall Financial Resource Strain (CARDIA)    Difficulty of Paying Living Expenses: Not hard at all  Food Insecurity: No Food Insecurity (03/09/2024)   Hunger Vital Sign    Worried About Running Out of Food in the Last Year: Never true    Ran Out of Food in the Last Year: Never true  Transportation Needs: Unmet Transportation Needs (03/09/2024)   PRAPARE - Transportation    Lack of Transportation (Medical): Yes    Lack of Transportation (Non-Medical): Yes  Physical Activity: Inactive (02/09/2023)   Exercise Vital Sign    Days of Exercise per Week: 0 days    Minutes of Exercise per Session: 0 min  Stress: No Stress Concern Present (02/09/2023)   [employer] - Occupational Stress Questionnaire    Feeling of Stress : Not at all  Social Connections: Unknown (08/21/2023)   Received from [facility]   Social Network    Social Network: Not on file  Intimate Partner Violence: Not At Risk (03/09/2024)   Humiliation, Afraid, Rape, and Kick questionnaire    Fear of Current or Ex-Partner: No    Emotionally Abused: No    Physically Abused: No    Sexually Abused: No    Physical Exam      Future  Appointments  Date Time Provider Department Center  10/07/2024  8:20 AM Dartha Ernst, MD [facility] None  10/18/2024 10:15 AM [facility] US  2 [id number] [facility] [address] [facility]  10/31/2024 10:55 AM Aniceto Daphne CROME, NP [facility] H&V  11/15/2024  3:00 PM [facility] PA/NP [facility] None       [name], Paramedic [phone] [facility] Paramedic  10/03/24

## 2024-10-03 NOTE — ED Provider Notes (Signed)
 [facility] Provider Note   CSN: [id number] Arrival date & time: 10/03/24  1105     Patient presents with: Abdominal Pain, Blood In Stools, and Gastroesophageal Reflux   [name] is a 63 y.o. female.    AF, HFrEF (35-40% in May, 2025), CAD, HLD, carotid stenosis s/p CEA, PAD, tobacco abuse, COPD, gout, GIB presenting with concerns for GIB and acid reflux.  Patient also reports 30 pound weight gain over the past few weeks, has been compliant with medications but does feel her abdomen and her legs are significantly more swollen.  She states that she started noticing some blood in her stool today, and states that her stool is very dark and tarry.  Reportedly had 1 episode of this today.  She denies nausea or vomiting, denies chest pain.  She denies shortness of breath as well.  Denies fever or chills.  She of note originally told triage that she did have abdominal pain, however she denies abdominal pain on evaluation today.  On chart review, admitted 03/08/24 - 03/17/24 with chest pain and melena.  Pt had been using BC powder, Hgb was 5.3 on admit.  Endoscopy was negative for source of bleeding.  Colonoscopy showed multiple polyps. Seen in EP Clinic 04/29/24 and was doing well. No further bleeding, back on ELiquis  and Hgb up to 8gm.    Abdominal Pain Gastroesophageal Reflux Associated symptoms include abdominal pain.       Prior to Admission medications   Medication Sig Start Date End Date Taking? Authorizing Provider  acetaminophen  (TYLENOL ) 500 MG tablet Take 1,000 mg by mouth every 6 (six) hours as needed for headache (pain).   Yes [provider]  albuterol  (VENTOLIN  HFA) 108 (90 Base) MCG/ACT inhaler USE 2 PUFFS BY MOUTH EVERY FOUR HOURS, AS NEEDED, FOR COUGHING/WHEEZING 07/12/23  Yes Newlin, [name], MD  allopurinol  (ZYLOPRIM ) 100 MG tablet Take 100 mg by mouth daily.   Yes [provider]  amiodarone  (PACERONE ) 200 MG  tablet Take 0.5 tablets (100 mg total) by mouth daily. 04/29/24  Yes Ollis, Daphne CROME, NP  budesonide -formoterol  (SYMBICORT ) 160-4.5 MCG/ACT inhaler Inhale 2 puffs into the lungs 2 (two) times daily. 07/12/23  Yes Newlin, [name], MD  buPROPion  (WELLBUTRIN  XL) 300 MG 24 hr tablet Take 300 mg by mouth every morning. 02/14/24  Yes [provider]  Cholecalciferol  (VITAMIN D3) 50 MCG (2000 UT) capsule Take 1 capsule by mouth every morning.   Yes [provider]  colchicine  0.6 MG tablet Take 1.2 mg by mouth See admin instructions. Take 1.2 mg for flair up and an hour later take 0.6 mg if needed   Yes [provider]  docusate sodium  (COLACE) 100 MG capsule Take 2 capsules (200 mg total) by mouth at bedtime. 03/17/24  Yes Regalado, Belkys A, MD  ELIQUIS  5 MG TABS tablet TAKE 1 TABLET (5 MG TOTAL) BY MOUTH 2 (TWO) TIMES DAILY. 06/10/24  Yes Clegg, [name] D, NP  folic acid  (FOLVITE ) 1 MG tablet Take 1 tablet (1 mg total) by mouth daily. 03/17/24  Yes Regalado, Belkys A, MD  insulin  glargine (LANTUS ) 100 UNIT/ML injection Inject 58 Units into the skin at bedtime.   Yes [provider]  insulin  lispro (HUMALOG ) 100 UNIT/ML KwikPen Inject 15 Units into the skin 3 (three) times daily with meals. Plus sliding scale, max dose 70 units /day 03/17/24  Yes Regalado, Belkys A, MD  isosorbide -hydrALAZINE  (BIDIL ) 20-37.5 MG tablet Take 1 tablet  by mouth 3 (three) times daily. 05/21/24  Yes [place], Harlene HERO, FNP  levothyroxine  (SYNTHROID ) 50 MCG tablet TAKE 1 TABLET (50 MCG TOTAL) BY MOUTH DAILY BEFORE BREAKFAST (AM) 05/02/23  Yes Newlin, [name], MD  Menthol -Camphor (ICY HOT PRO NO MESS EX) Apply 1 Application topically 3 (three) times daily as needed (arms, neuropathy lower extrimity, gout in hand.).   Yes [provider]  metolazone  (ZAROXOLYN ) 2.5 MG tablet Take 2.5mg  metolazone  every other day X 3 doses. Then PRN when advised by provider 10/03/24  Yes [place] A, FNP  metoprolol   succinate (TOPROL -XL) 50 MG 24 hr tablet Take 1 tablet (50 mg total) by mouth daily. 08/19/24  Yes Rolan Ezra RAMAN, MD  NYAMYC  powder Apply 1 Application topically 2 (two) times daily. 02/09/24  Yes [provider]  nystatin  cream (MYCOSTATIN ) Apply 1 Application topically 2 (two) times daily as needed for dry skin. 02/09/24  Yes [provider]  Omega-3 Fatty Acids (FISH OIL PO) Take 1 tablet by mouth daily.   Yes [provider]  pantoprazole  (PROTONIX ) 40 MG tablet TAKE 1 TABLET (40 MG TOTAL) BY MOUTH DAILY(AM) 05/02/23  Yes Newlin, [name], MD  polyethylene glycol (MIRALAX  / GLYCOLAX ) 17 g packet Take 17 g by mouth daily as needed for moderate constipation. 08/05/23  Yes Akula, Vijaya, MD  potassium chloride  SA (KLOR-CON  M) 20 MEQ tablet Take 2 tablets (40 mEq total) by mouth every morning AND 1 tablet (20 mEq total) every evening. 08/19/24  Yes Rolan Ezra RAMAN, MD  rosuvastatin  (CRESTOR ) 40 MG tablet Take 1 tablet (40 mg total) by mouth daily. 04/04/24  Yes Rolan Ezra RAMAN, MD  tirzepatide  (MOUNJARO ) 7.5 MG/0.5ML Pen Inject 7.5 mg into the skin once a week. Patient taking differently: Inject 7.5 mg into the skin every Friday. 05/22/24  Yes Motwani, Komal, MD  torsemide  (DEMADEX ) 20 MG tablet Take 3 tablets (60 mg total) by mouth every morning AND 2 tablets (40 mg total) every evening. 09/16/24 12/15/24 Yes Hayes Beckey CROME, NP  VITAMIN E PO Take 1 tablet by mouth daily.   Yes [provider]  Continuous Glucose Receiver (DEXCOM G7 RECEIVER) DEVI 1 Device by Does not apply route continuous. 06/25/24   Motwani, Komal, MD  Continuous Glucose Sensor (DEXCOM G7 SENSOR) MISC 1 Device by Does not apply route continuous. 06/25/24   Motwani, Komal, MD  Glucagon  (BAQSIMI  ONE PACK) 3 MG/DOSE POWD Place 1 Device into the nose as needed (Low blood sugar with impaired consciousness). 05/22/24   Motwani, Komal, MD  tiotropium (SPIRIVA  HANDIHALER) 18 MCG inhalation capsule Place 1 capsule (18  mcg total) into inhaler and inhale every morning. 12/09/19 02/09/21  Sebastian Toribio GAILS, MD    Allergies: Neurontin  [gabapentin ] and Lyrica  [pregabalin ]    Review of Systems  Gastrointestinal:  Positive for abdominal pain.    Updated Vital Signs BP (!) 142/87   Pulse (!) 58   Temp 97.9 F (36.6 C) (Oral)   Resp 17   LMP  (LMP Unknown)   SpO2 100%   Physical Exam Vitals and nursing note reviewed.  Constitutional:      General: She is not in acute distress.    Appearance: She is well-developed. She is not ill-appearing.     Comments: 3+ pitting edema bilateral lower extremities, abdomen is soft, however distended with significant amount of fluid present over the abdomen.  HENT:     Head: Normocephalic and atraumatic.  Cardiovascular:     Rate and Rhythm: Normal  rate and regular rhythm.     Heart sounds: Normal heart sounds. No murmur heard.    No gallop.  Pulmonary:     Breath sounds: Wheezing present.     Comments: Wheezing present throughout all lung fields, mildly tight. Abdominal:     General: There is distension.     Palpations: Abdomen is soft.     Tenderness: There is no abdominal tenderness. There is no right CVA tenderness, left CVA tenderness, guarding or rebound. Negative signs include Murphy's sign, Rovsing's sign and McBurney's sign.  Genitourinary:    Rectum: Guaiac result positive (Scant amount of poop appreciated, very small amount of melanotic stool appreciated on rectal exam.).  Skin:    General: Skin is warm.     Capillary Refill: Capillary refill takes less than 2 seconds.  Neurological:     General: No focal deficit present.     Mental Status: She is alert and oriented to person, place, and time.     (all labs ordered are listed, but only abnormal results are displayed) Labs Reviewed  COMPREHENSIVE METABOLIC PANEL WITH GFR - Abnormal; Notable for the following components:      Result Value   Glucose, Bld 210 (*)    BUN 98 (*)    Creatinine, Ser  2.46 (*)    Calcium  8.4 (*)    Total Protein 6.2 (*)    Albumin  3.2 (*)    GFR, Estimated 22 (*)    Anion gap 17 (*)    All other components within normal limits  CBC - Abnormal; Notable for the following components:   RBC 2.92 (*)    Hemoglobin 8.6 (*)    HCT 29.0 (*)    MCHC 29.7 (*)    RDW 18.7 (*)    All other components within normal limits  URINALYSIS, ROUTINE W REFLEX MICROSCOPIC - Abnormal; Notable for the following components:   APPearance HAZY (*)    Nitrite POSITIVE (*)    Leukocytes,Ua LARGE (*)    Bacteria, UA MANY (*)    All other components within normal limits  BRAIN NATRIURETIC PEPTIDE - Abnormal; Notable for the following components:   B Natriuretic Peptide 405.1 (*)    All other components within normal limits  BRAIN NATRIURETIC PEPTIDE - Abnormal; Notable for the following components:   B Natriuretic Peptide 386.8 (*)    All other components within normal limits  HEMOGLOBIN AND HEMATOCRIT, BLOOD - Abnormal; Notable for the following components:   Hemoglobin 8.7 (*)    HCT 29.2 (*)    All other components within normal limits  CBG MONITORING, ED - Abnormal; Notable for the following components:   Glucose-Capillary 144 (*)    All other components within normal limits  RESPIRATORY PANEL BY PCR  URINE CULTURE  LIPASE, BLOOD  HIV ANTIBODY (ROUTINE TESTING W REFLEX)  COMPREHENSIVE METABOLIC PANEL WITH GFR  CBC  HEMOGLOBIN AND HEMATOCRIT, BLOOD  HEMOGLOBIN AND HEMATOCRIT, BLOOD  TYPE AND SCREEN  PREPARE RBC (CROSSMATCH)    EKG: EKG Interpretation Date/Time:  Thursday October 03 2024 22:30:27 EST Ventricular Rate:  62 PR Interval:  58 QRS Duration:  120 QT Interval:  456 QTC Calculation: 464 R Axis:   32  Text Interpretation: Sinus rhythm Short PR interval Incomplete left bundle branch block Anterior Q waves, possibly due to ILBBB Borderline ST elevation, inferior leads When compared with ECG of 08/19/2024, No significant change was found Confirmed  by Raford Lenis ([id number]) on 10/03/2024 11:17:42 PM  Radiology: [facility]  Chest Portable 1 View Result Date: 10/03/2024 CLINICAL DATA:  Shortness of breath with abdominal pain and bloody stool. EXAM: PORTABLE CHEST 1 VIEW COMPARISON:  April 24, 2024 FINDINGS: The cardiac silhouette is mildly enlarged and unchanged in size. There is mild prominence of the pulmonary vasculature. Mild atelectasis is suspected within the right lung base. No acute infiltrate, pleural effusion or pneumothorax is identified. The visualized skeletal structures are unremarkable. IMPRESSION: 1. Mild cardiomegaly and mild pulmonary vascular congestion. 2. Mild right basilar atelectasis. Electronically Signed   By: Suzen Dials M.D.   On: 10/03/2024 21:09     Procedures   Medications Ordered in the ED  albumin  human 25 % solution 25 g (has no administration in time range)  0.9 %  sodium chloride  infusion (Manually program via Guardrails IV Fluids) (has no administration in time range)  pantoprazole  (PROTONIX ) injection 40 mg (0 mg Intravenous Hold 10/04/24 0012)  amiodarone  (PACERONE ) tablet 100 mg (has no administration in time range)  metolazone  (ZAROXOLYN ) tablet 2.5 mg (has no administration in time range)  metoprolol  succinate (TOPROL -XL) 24 hr tablet 50 mg (has no administration in time range)  rosuvastatin  (CRESTOR ) tablet 40 mg (has no administration in time range)  furosemide  (LASIX ) injection 80 mg (has no administration in time range)  buPROPion  (WELLBUTRIN  XL) 24 hr tablet 300 mg (has no administration in time range)  insulin  glargine-yfgn (SEMGLEE ) injection 50 Units (has no administration in time range)  levothyroxine  (SYNTHROID ) tablet 50 mcg (has no administration in time range)  methylPREDNISolone  sodium succinate (SOLU-MEDROL ) 40 mg/mL injection 40 mg (has no administration in time range)  azithromycin  (ZITHROMAX ) 500 mg in sodium chloride  0.9 % 250 mL IVPB (has no administration in time range)  cefTRIAXone   (ROCEPHIN ) 1 g in sodium chloride  0.9 % 100 mL IVPB (has no administration in time range)  sodium chloride  flush (NS) 0.9 % injection 3 mL (3 mLs Intravenous Given 10/04/24 0010)  sodium chloride  flush (NS) 0.9 % injection 3 mL (has no administration in time range)  0.9 %  sodium chloride  infusion (has no administration in time range)  acetaminophen  (TYLENOL ) tablet 650 mg (has no administration in time range)    Or  acetaminophen  (TYLENOL ) suppository 650 mg (has no administration in time range)  ondansetron  (ZOFRAN ) tablet 4 mg (has no administration in time range)    Or  ondansetron  (ZOFRAN ) injection 4 mg (has no administration in time range)  insulin  aspart (novoLOG ) injection 0-15 Units (has no administration in time range)  insulin  aspart (novoLOG ) injection 0-5 Units ( Subcutaneous Not Given 10/04/24 0009)  isosorbide -hydrALAZINE  (BIDIL ) 20-37.5 MG per tablet 1 tablet (has no administration in time range)  ipratropium-albuterol  (DUONEB) 0.5-2.5 (3) MG/3ML nebulizer solution 3 mL (has no administration in time range)  ipratropium-albuterol  (DUONEB) 0.5-2.5 (3) MG/3ML nebulizer solution 3 mL (has no administration in time range)  nicotine  (NICODERM CQ  - dosed in mg/24 hours) patch 14 mg (has no administration in time range)  sodium chloride  flush (NS) 0.9 % injection 10-40 mL (has no administration in time range)  furosemide  (LASIX ) 120 mg in dextrose  5 % 50 mL IVPB (0 mg Intravenous Stopped 10/04/24 0100)  ipratropium-albuterol  (DUONEB) 0.5-2.5 (3) MG/3ML nebulizer solution 3 mL (3 mLs Nebulization Given 10/03/24 2157)    Followed by  ipratropium-albuterol  (DUONEB) 0.5-2.5 (3) MG/3ML nebulizer solution 3 mL (3 mLs Nebulization Given 10/03/24 2157)    Followed by  ipratropium-albuterol  (DUONEB) 0.5-2.5 (3) MG/3ML nebulizer solution 3 mL (3 mLs Nebulization Given 10/03/24 2158)  methylPREDNISolone  sodium succinate (SOLU-MEDROL ) 125 mg/2 mL injection 125 mg (125 mg Intravenous Given  10/03/24 2156)  pantoprazole  (PROTONIX ) injection 40 mg (40 mg Intravenous Given 10/03/24 2153)  cefTRIAXone  (ROCEPHIN ) 2 g in sodium chloride  0.9 % 100 mL IVPB (0 g Intravenous Stopped 10/03/24 2258)                                    Medical Decision Making Amount and/or Complexity of Data Reviewed Labs: ordered. Radiology: ordered.  Risk Prescription drug management. Decision regarding hospitalization.   Based on patient presentation, history, evaluation, high suspicion for UGIB likely in the setting of PUD, with known history, as well as UTI, CHF exacerbation with associated fluid overload.  Started on Lasix , ED, given Rocephin  for UTI, as well as prophylaxis for UGIB.  Given Protonix .  Mild wheezing appreciated on arrival, given DuoNebs, and Solu-Medrol .  Hemodynamically stable, afebrile, and satting well on room air on arrival.  Hemoglobin is stable likely improved in comparison to prior labs.  Discussed with hospitalist service, agreeable to admission for all the above.  Will plan to consult GI following admission this patient is hemodynamically stable, low suspicion for active or unstable GI bleed.     Final diagnoses:  UGIB (upper gastrointestinal bleed)  Acute on chronic systolic congestive heart failure ([name])  Other hypervolemia          Arlee Katz, MD 10/04/24 0154    Gennaro Bouchard L, DO 10/07/24 1711

## 2024-10-04 ENCOUNTER — Telehealth: Payer: Self-pay

## 2024-10-04 ENCOUNTER — Inpatient Hospital Stay (HOSPITAL_COMMUNITY)

## 2024-10-04 ENCOUNTER — Other Ambulatory Visit (HOSPITAL_COMMUNITY): Payer: Self-pay

## 2024-10-04 DIAGNOSIS — I5021 Acute systolic (congestive) heart failure: Secondary | ICD-10-CM

## 2024-10-04 DIAGNOSIS — I739 Peripheral vascular disease, unspecified: Secondary | ICD-10-CM

## 2024-10-04 DIAGNOSIS — J441 Chronic obstructive pulmonary disease with (acute) exacerbation: Secondary | ICD-10-CM | POA: Diagnosis not present

## 2024-10-04 DIAGNOSIS — K922 Gastrointestinal hemorrhage, unspecified: Secondary | ICD-10-CM | POA: Diagnosis not present

## 2024-10-04 DIAGNOSIS — I5041 Acute combined systolic (congestive) and diastolic (congestive) heart failure: Secondary | ICD-10-CM | POA: Diagnosis not present

## 2024-10-04 DIAGNOSIS — D649 Anemia, unspecified: Secondary | ICD-10-CM

## 2024-10-04 DIAGNOSIS — I509 Heart failure, unspecified: Secondary | ICD-10-CM

## 2024-10-04 DIAGNOSIS — I5020 Unspecified systolic (congestive) heart failure: Secondary | ICD-10-CM

## 2024-10-04 LAB — COMPREHENSIVE METABOLIC PANEL WITH GFR
ALT: 20 U/L (ref 0–44)
AST: 24 U/L (ref 15–41)
Albumin: 3.4 g/dL — ABNORMAL LOW (ref 3.5–5.0)
Alkaline Phosphatase: 90 U/L (ref 38–126)
Anion gap: 13 (ref 5–15)
BUN: 99 mg/dL — ABNORMAL HIGH (ref 8–23)
CO2: 24 mmol/L (ref 22–32)
Calcium: 8.1 mg/dL — ABNORMAL LOW (ref 8.9–10.3)
Chloride: 102 mmol/L (ref 98–111)
Creatinine, Ser: 2.45 mg/dL — ABNORMAL HIGH (ref 0.44–1.00)
GFR, Estimated: 22 mL/min — ABNORMAL LOW (ref >60)
Glucose, Bld: 315 mg/dL — ABNORMAL HIGH (ref 70–99)
Potassium: 4.7 mmol/L (ref 3.5–5.1)
Sodium: 139 mmol/L (ref 135–145)
Total Bilirubin: 0.6 mg/dL (ref 0.0–1.2)
Total Protein: 6.6 g/dL (ref 6.5–8.1)

## 2024-10-04 LAB — RESP PANEL BY RT-PCR (RSV, FLU A&B, COVID)  RVPGX2
Influenza A by PCR: NEGATIVE
Influenza B by PCR: NEGATIVE
Resp Syncytial Virus by PCR: NEGATIVE
SARS Coronavirus 2 by RT PCR: NEGATIVE

## 2024-10-04 LAB — PROTIME-INR
INR: 1.2 (ref 0.8–1.2)
Prothrombin Time: 15.5 s — ABNORMAL HIGH (ref 11.4–15.2)

## 2024-10-04 LAB — BRAIN NATRIURETIC PEPTIDE: B Natriuretic Peptide: 405.1 pg/mL — ABNORMAL HIGH (ref 0.0–100.0)

## 2024-10-04 LAB — HEMOGLOBIN AND HEMATOCRIT, BLOOD
HCT: 26.4 % — ABNORMAL LOW (ref 36.0–46.0)
HCT: 29.2 % — ABNORMAL LOW (ref 36.0–46.0)
Hemoglobin: 7.9 g/dL — ABNORMAL LOW (ref 12.0–15.0)
Hemoglobin: 8.7 g/dL — ABNORMAL LOW (ref 12.0–15.0)

## 2024-10-04 LAB — TROPONIN I (HIGH SENSITIVITY)
Troponin I (High Sensitivity): 13 ng/L (ref <18)
Troponin I (High Sensitivity): 23 ng/L — ABNORMAL HIGH (ref <18)
Troponin I (High Sensitivity): 56 ng/L — ABNORMAL HIGH (ref <18)
Troponin I (High Sensitivity): 79 ng/L — ABNORMAL HIGH (ref <18)

## 2024-10-04 LAB — CBC
HCT: 26.6 % — ABNORMAL LOW (ref 36.0–46.0)
HCT: 30.2 % — ABNORMAL LOW (ref 36.0–46.0)
HCT: 27.4 % — ABNORMAL LOW (ref 36.0–46.0)
Hemoglobin: 8 g/dL — ABNORMAL LOW (ref 12.0–15.0)
Hemoglobin: 9 g/dL — ABNORMAL LOW (ref 12.0–15.0)
Hemoglobin: 8.3 g/dL — ABNORMAL LOW (ref 12.0–15.0)
MCH: 29.6 pg (ref 26.0–34.0)
MCH: 29 pg (ref 26.0–34.0)
MCH: 29.4 pg (ref 26.0–34.0)
MCHC: 30.1 g/dL (ref 30.0–36.0)
MCHC: 29.8 g/dL — ABNORMAL LOW (ref 30.0–36.0)
MCHC: 30.3 g/dL (ref 30.0–36.0)
MCV: 98.5 fL (ref 80.0–100.0)
MCV: 97.4 fL (ref 80.0–100.0)
MCV: 97.2 fL (ref 80.0–100.0)
Platelets: 238 K/uL (ref 150–400)
Platelets: 231 K/uL (ref 150–400)
Platelets: 197 K/uL (ref 150–400)
RBC: 2.7 MIL/uL — ABNORMAL LOW (ref 3.87–5.11)
RBC: 3.1 MIL/uL — ABNORMAL LOW (ref 3.87–5.11)
RBC: 2.82 MIL/uL — ABNORMAL LOW (ref 3.87–5.11)
RDW: 18.7 % — ABNORMAL HIGH (ref 11.5–15.5)
RDW: 18.6 % — ABNORMAL HIGH (ref 11.5–15.5)
RDW: 18.5 % — ABNORMAL HIGH (ref 11.5–15.5)
WBC: 8.3 K/uL (ref 4.0–10.5)
WBC: 11.2 K/uL — ABNORMAL HIGH (ref 4.0–10.5)
WBC: 11.1 K/uL — ABNORMAL HIGH (ref 4.0–10.5)
nRBC: 0 % (ref 0.0–0.2)
nRBC: 0 % (ref 0.0–0.2)
nRBC: 0 % (ref 0.0–0.2)

## 2024-10-04 LAB — RESPIRATORY PANEL BY PCR

## 2024-10-04 LAB — RETICULOCYTES
Immature Retic Fract: 21.4 % — ABNORMAL HIGH (ref 2.3–15.9)
RBC.: 3.04 MIL/uL — ABNORMAL LOW (ref 3.87–5.11)
Retic Count, Absolute: 128.9 K/uL (ref 19.0–186.0)
Retic Ct Pct: 4.2 % — ABNORMAL HIGH (ref 0.4–3.1)

## 2024-10-04 LAB — BASIC METABOLIC PANEL WITH GFR
Anion gap: 15 (ref 5–15)
BUN: 92 mg/dL — ABNORMAL HIGH (ref 8–23)
CO2: 24 mmol/L (ref 22–32)
Calcium: 8.3 mg/dL — ABNORMAL LOW (ref 8.9–10.3)
Chloride: 100 mmol/L (ref 98–111)
Creatinine, Ser: 2.36 mg/dL — ABNORMAL HIGH (ref 0.44–1.00)
GFR, Estimated: 23 mL/min — ABNORMAL LOW (ref >60)
Glucose, Bld: 311 mg/dL — ABNORMAL HIGH (ref 70–99)
Potassium: 4.6 mmol/L (ref 3.5–5.1)
Sodium: 139 mmol/L (ref 135–145)

## 2024-10-04 LAB — FOLATE: Folate: 14.6 ng/mL (ref >5.9)

## 2024-10-04 LAB — ECHOCARDIOGRAM COMPLETE
Area-P 1/2: 5.7 cm2
Calc EF: 26.9 %
S' Lateral: 5.3 cm
Single Plane A2C EF: 30 %
Single Plane A4C EF: 20.8 %

## 2024-10-04 LAB — GLUCOSE, CAPILLARY
Glucose-Capillary: 345 mg/dL — ABNORMAL HIGH (ref 70–99)
Glucose-Capillary: 311 mg/dL — ABNORMAL HIGH (ref 70–99)
Glucose-Capillary: 292 mg/dL — ABNORMAL HIGH (ref 70–99)

## 2024-10-04 LAB — IRON AND TIBC
Iron: 49 ug/dL (ref 28–170)
Saturation Ratios: 16 % (ref 10.4–31.8)
TIBC: 311 ug/dL (ref 250–450)
UIBC: 262 ug/dL

## 2024-10-04 LAB — CBG MONITORING, ED
Glucose-Capillary: 144 mg/dL — ABNORMAL HIGH (ref 70–99)
Glucose-Capillary: 382 mg/dL — ABNORMAL HIGH (ref 70–99)

## 2024-10-04 LAB — PROCALCITONIN: Procalcitonin: <0.1 ng/mL

## 2024-10-04 LAB — TSH: TSH: 1.58 u[IU]/mL (ref 0.350–4.500)

## 2024-10-04 LAB — PREPARE RBC (CROSSMATCH)

## 2024-10-04 LAB — AMMONIA: Ammonia: 17 umol/L (ref 9–35)

## 2024-10-04 LAB — VITAMIN B12: Vitamin B-12: 402 pg/mL (ref 180–914)

## 2024-10-04 LAB — HIV ANTIBODY (ROUTINE TESTING W REFLEX): HIV Screen 4th Generation wRfx: NONREACTIVE

## 2024-10-04 LAB — T4, FREE: Free T4: 1.12 ng/dL (ref 0.61–1.12)

## 2024-10-04 MED ORDER — BUDESONIDE 0.25 MG/2ML IN SUSP
0.2500 mg | Freq: Two times a day (BID) | RESPIRATORY_TRACT | Status: DC
  Administered 2024-10-04 – 2024-10-12 (×15): 0.25 mg via RESPIRATORY_TRACT
  Filled 2024-10-04 (×16): qty 2

## 2024-10-04 MED ORDER — SODIUM CHLORIDE 0.9% FLUSH: 10.0000 mL | INTRAVENOUS | Status: DC | PRN

## 2024-10-04 MED ORDER — AMIODARONE HCL 200 MG PO TABS: 100.0000 mg | ORAL_TABLET | Freq: Every day | ORAL | 1 refills | Status: DC

## 2024-10-04 MED ORDER — LORAZEPAM 2 MG/ML IJ SOLN
1.0000 mg | INTRAMUSCULAR | Status: AC
  Administered 2024-10-04: 1 mg via INTRAVENOUS
  Filled 2024-10-04: qty 1

## 2024-10-04 MED ORDER — OXYCODONE HCL 5 MG PO TABS
5.0000 mg | ORAL_TABLET | ORAL | Status: DC | PRN
  Administered 2024-10-06 – 2024-10-12 (×6): 5 mg via ORAL
  Filled 2024-10-04 (×6): qty 1

## 2024-10-04 MED ORDER — ISOSORB DINITRATE-HYDRALAZINE 20-37.5 MG PO TABS
1.0000 | ORAL_TABLET | Freq: Three times a day (TID) | ORAL | 3 refills | Status: DC
Start: 2024-10-04 — End: 2024-10-12

## 2024-10-04 MED ORDER — TRAZODONE HCL 50 MG PO TABS: 100.0000 mg | ORAL_TABLET | Freq: Every evening | ORAL | Status: DC | PRN

## 2024-10-04 MED ORDER — NICOTINE POLACRILEX 2 MG MT GUM: 2.0000 mg | CHEWING_GUM | OROMUCOSAL | Status: DC | PRN

## 2024-10-04 MED ORDER — LORAZEPAM 1 MG PO TABS: 0.5000 mg | ORAL_TABLET | Freq: Once | ORAL | Status: DC

## 2024-10-04 MED ORDER — PERFLUTREN LIPID MICROSPHERE
1.0000 mL | INTRAVENOUS | Status: AC | PRN
  Administered 2024-10-04: 2 mL via INTRAVENOUS

## 2024-10-04 MED ORDER — FENTANYL CITRATE (PF) 50 MCG/ML IJ SOSY: 12.5000 ug | PREFILLED_SYRINGE | INTRAMUSCULAR | Status: DC | PRN

## 2024-10-04 MED ORDER — FENTANYL CITRATE (PF) 50 MCG/ML IJ SOSY
12.5000 ug | PREFILLED_SYRINGE | INTRAMUSCULAR | Status: DC | PRN
Start: 2024-10-04 — End: 2024-10-04
  Filled 2024-10-04: qty 1

## 2024-10-04 NOTE — Progress Notes (Signed)
 [facility] Progress Note Patient: [name] FMW:[id number] DOB: 1961/02/28  DOA: 10/03/2024 DOS: the patient was seen and examined on 10/04/2024  Brief Hospital Course: Patient with PMH of paroxysmal A-fib on Eliquis , CKD stage IV, chronic HFrEF, EF 35%, CAD, HLD, PAD, active smoker, hypothyroidism, COPD, gout, recurrent GI bleed presented to the hospital with complaints of cough and shortness of breath. Currently been treated for acute on chronic HFrEF as well as GI bleed.  Assessment and Plan: GI bleeding- Melena Reports epigastric pain.  Also reports 2 episodes of black tarry stool. Had similar presentation in May 25. EGD at that time and colonoscopy at that time showed evidence of Candida esophagitis. Transfuse for hemoglobin less than 8. Currently continuing PPI twice daily.  IV. GI currently has no plans for endoscopy recommending capsule endoscopy if her hemoglobin remains still low. Currently holding Eliquis .  Acute on chronic HFrEF. Essential hypertension EF 35 to 40% Severe mitral valve regurgitation. Presents with complaints of shortness of breath as well as lower extremity swelling. Reports 30 pound weight gain. Chest x-ray showed vascular congestion. Currently receiving IV Lasix  80 mg twice daily. Home regimen torsemide  60 mg twice daily. Monitor ins and outs. Sees advanced heart failure service outpatient.  Low threshold to consult cardiology if no improvement in volume status. Continue BiDil  and hold Toprol -XL Echocardiogram EF 30 to 35%.  Global hypokinesis.  Severe mitral regurgitation.  Elevated troponin. Likely demand ischemia in the setting of heart failure and COPD exacerbation. Troponins are trending up from 13-56 but not consistent with ACS territory. EKG negative for any acute ischemia as well. Has chronic LBBB. Echocardiogram also performed which also does not show any evidence of new wall motion pulmonary. No anticoagulation given  presentation with GI bleed. For now monitor clinically.  And treat underlying process.   Acute COPD exacerbation Patient has bilateral expiratory wheezes. Concern for COPD exacerbation although COVID and RVP both negative. Procalcitonin also negative. Started on steroids, will taper off. Continue DuoNebs.  Check ABG.  BiPAP as needed.   Acute cystitis Complaints of dysuria.  Nitrite positive UA with pyuria and leukocytosis. Currently on IV ceftriaxone . Follow-up on urine cultures.   Paroxysmal atrial fibrillation Rate currently controlled. Holding Toprol -XL. Holding Eliquis  in the setting of GI bleed.   Hyperlipidemia Continue Crestor    Hypothyroidism Continue Synthroid .   History of CAD and PAD Continue Crestor  and Toprol -XL. Holding antiplatelet therapy in the setting of GI bleed.   History of gout Holding allopurinol  in the setting of advanced CKD.   History of chronic tobacco smoking Still smokes 1 pack a day.  Continue nicotine  patch.  Counseled patient for smoking cessation   CKD stage IV Baseline serum creatinine appears to be around 2-2.7. On presentation serum creatinine 2.46. Monitor while receiving diuresis.Generalized anxiety disorder  Depression. Acute public encephalopathy. Metabolic workup appears to be unremarkable. Holding Wellbutrin  XL   Subjective: + Chest pain earlier in the morning.  Appears to be drowsy.  No nausea no vomiting.  Chest pain resolved after receiving pain medication.  No further bowel movement of with blood reported by the patient.  No tingling or numbness.  She reports that her speech has been slurred like this for a while.  Physical Exam: Alert awake and oriented x 3. Pupils are equal and reactive to light. Oral mucosa is dry. Tongue midline. S1-S2 present.  Aortic systolic murmur heard. Bilateral expiratory wheezes as well as crackles heard. Increased respiratory effort Bowel sounds present.  Nontender. Moderate lower  extremity edema seen.  Data Reviewed: I have Reviewed nursing notes, Vitals, and Lab results. Since last encounter, pertinent lab results CBC and BMP and troponin and ABG   . I have ordered test including CBC and BMP  .   Disposition: Status is: Inpatient Remains inpatient appropriate because: Monitor for improvement in volume status  SCDs Start: 10/03/24 2223 Place TED hose Start: 10/03/24 2223  Family Communication: No one at bedside Level of care: Progressive   Vitals:   10/04/24 0918 10/04/24 1210 10/04/24 1511 10/04/24 1520  BP: (!) 141/80 (!) 113/54    Pulse: 79 87  88  Resp: (!) 22 20    Temp: 97.7 F (36.5 C) 97.8 F (36.6 C)    TempSrc: Oral Oral  Oral  SpO2: 94% 94% 93% 99%    The patient is critically ill with multiple organ systems failure and requires high complexity decision making for assessment and support, frequent evaluation and titration of therapies. Critical Care Time devoted to patient care services described in this note is 35 minutes   Author: Yetta Blanch, MD 10/04/2024 5:30 PM  Please look on [email or web address] to find out who is on call.

## 2024-10-04 NOTE — ED Notes (Signed)
 I tried to get patient blood. I had no success, The Nurse was informed.

## 2024-10-04 NOTE — Progress Notes (Signed)
  Echocardiogram 2D Echocardiogram has been performed.  Charmaine KANDICE Gaskins 10/04/2024, 10:36 AM

## 2024-10-04 NOTE — ED Notes (Signed)
 Called let floor know patient coming up

## 2024-10-04 NOTE — Progress Notes (Signed)
 Heart Failure Navigator Progress Note  Assessed for Heart & Vascular TOC clinic readiness.  Patient does not meet criteria due to she is an Advanced Heart Failure Team patient of Dr. Rolan. .   Navigator will sign off at this time.   Stephane Haddock, BSN, Scientist, clinical (histocompatibility and immunogenetics) Only

## 2024-10-04 NOTE — Telephone Encounter (Signed)
 Refill for amiodarone  sent to [facility], [place] per [place].

## 2024-10-04 NOTE — TOC Initial Note (Addendum)
 Transition of Care [facility]) - Initial/Assessment Note    Patient Details  Name: [name] MRN: [id number] Date of Birth: 1961/09/22  Transition of Care [facility]) CM/SW Contact:    Waddell Barnie Rama, RN Phone Number: 10/04/2024, 1:54 PM  Clinical Narrative:                 From home with spouse, has PCP and insurance on file, states has no HH services in place at this time or DME at home.  States family member (sister)  will transport them home at [employer] and family is support system, states gets medications from [facility].  Pta self ambulatory with rollator.   There are no ICM needs identified  at this time.  Please place consult for ICM needs.  She is enrolled in the Paramedicine program.  Expected Discharge Plan: Home/Self Care Barriers to Discharge: Continued Medical Work up   Patient Goals and CMS Choice Patient states their goals for this hospitalization and ongoing recovery are:: from home with sister   Choice offered to / list presented to : NA      Expected Discharge Plan and Services In-house Referral: NA Discharge Planning Services: CM Consult Post Acute Care Choice: NA Living arrangements for the past 2 months: Single Family Home                 DME Arranged: N/A DME Agency: NA       HH Arranged: NA          Prior Living Arrangements/Services Living arrangements for the past 2 months: Single Family Home Lives with:: Siblings (sister) Patient language and need for interpreter reviewed:: Yes Do you feel safe going back to the place where you live?: Yes      Need for Family Participation in Patient Care: Yes (Comment) Care giver support system in place?: Yes (comment) Current home services: DME (w/chair, rollator) Criminal Activity/Legal Involvement Pertinent to Current Situation/Hospitalization: No - Comment as needed  Activities of Daily Living      Permission Sought/Granted Permission sought to share information with : Case Manager Permission  granted to share information with : Yes, Verbal Permission Granted              Emotional Assessment   Attitude/Demeanor/Rapport: Engaged Affect (typically observed): Appropriate Orientation: : Oriented to Self, Oriented to Place, Oriented to  Time, Oriented to Situation Alcohol / Substance Use: Not Applicable Psych Involvement: No (comment)  Admission diagnosis:  GI bleed [K92.2] Acute on chronic systolic congestive heart failure ([name]) [I50.23] UGIB (upper gastrointestinal bleed) [K92.2] Other hypervolemia [E87.79] Patient Active Problem List   Diagnosis Date Noted   COPD with acute exacerbation ([name]) 10/03/2024   Acute cystitis 10/03/2024   History of CEA (carotid endarterectomy) 10/03/2024   Gout 10/03/2024   Anemia of chronic renal failure 08/06/2024   Persistent atrial fibrillation ([name]) 03/15/2024   Benign neoplasm of ascending colon 03/14/2024   Benign neoplasm of transverse colon 03/14/2024   Benign neoplasm of descending colon 03/14/2024   Benign neoplasm of sigmoid colon 03/14/2024   Chronic systolic congestive heart failure ([name]) 03/12/2024   Melena 03/12/2024   Heme positive stool 03/11/2024   GI bleed 03/08/2024   History of CAD (coronary artery disease) 03/08/2024   CKD (chronic kidney disease), stage IV ([name]) 03/08/2024   Continuous dependence on cigarette smoking 03/08/2024   GAD (generalized anxiety disorder) 03/08/2024   HFrEF (heart failure with reduced ejection fraction) ([name]) 10/11/2023   Acute on chronic combined systolic (  congestive) and diastolic (congestive) heart failure ([name]) 10/10/2023   Pre-ulcerative calluses 04/25/2022   PAF (paroxysmal atrial fibrillation) ([name]) 05/05/2021   COPD (chronic obstructive pulmonary disease) ([name]) 05/05/2021   OSA (obstructive sleep apnea) 05/05/2021   Stage 3b chronic kidney disease ([name]) 05/05/2021   Pressure injury of skin 05/04/2021   Diabetic nephropathy ([name]) 01/02/2021   Low back pain 06/01/2020    Hyperlipidemia 04/03/2020   Peripheral artery disease 12/26/2019   NICM (nonischemic cardiomyopathy) ([name]) 06/20/2019   Non-healing ulcer ([name]) 06/20/2019   Acute CHF (congestive heart failure) ([name]) 11/26/2018   Coagulation disorder 08/09/2017   Depression 07/21/2017   At risk for adverse drug reaction 06/20/2017   Peripheral neuropathy 06/20/2017   S/P transmetatarsal amputation of foot, right ([name]) 06/05/2017   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) 05/19/2017   Obesity, class 2 02/24/2016   Anticoagulation management encounter 02/10/2016   Chronic sinus bradycardia 01/12/2016   Essential hypertension 12/22/2015   Demand ischemia ([name])    Acute on chronic diastolic CHF (congestive heart failure) ([name])    Symptomatic anemia 11/08/2015   Hypokalemia 11/08/2015   Tobacco abuse 10/23/2015   Coronary artery disease    DOE (dyspnea on exertion) 04/29/2015   Paroxysmal atrial fibrillation ([name]) 01/16/2015   Carotid artery stenosis 01/16/2015   Insomnia 02/03/2014   S/P peripheral artery angioplasty - TurboHawk atherectomy; R SFA 09/11/2013    Class: Acute   Leg pain, bilateral 08/19/2013   Hypothyroidism 07/31/2013   History of cocaine abuse ([name]) 06/13/2013   Long term current use of anticoagulant therapy 05/20/2013   Alcohol abuse    Narcotic abuse ([name])    Marijuana abuse    Alcoholic cirrhosis ([name])    Insulin  dependent type 2 diabetes mellitus ([name])    PCP:  Campbell Reynolds, NP Pharmacy:   [facility] - [place], [place] - [address] [place] [address] Phone: [phone] Fax: [phone]  [facility] - [place], [place] - [address] [place] [address] Phone: [phone] Fax: [phone]     Social Drivers of Health (SDOH) Social History: SDOH Screenings   Food Insecurity: No Food Insecurity (03/09/2024)  Housing: Low Risk   (03/09/2024)  Transportation Needs: Unmet Transportation Needs (03/09/2024)  Utilities: Not At Risk (03/09/2024)  Alcohol Screen: Low Risk  (01/28/2020)  Depression (PHQ2-9): Low Risk  (02/09/2023)  Financial Resource Strain: Low Risk  (02/09/2023)  Physical Activity: Inactive (02/09/2023)  Social Connections: Unknown (08/21/2023)   Received from [facility]  Stress: No Stress Concern Present (02/09/2023)  Tobacco Use: High Risk (10/03/2024)   SDOH Interventions:     Readmission Risk Interventions    10/04/2024    1:51 PM 03/12/2024    4:08 PM 09/27/2023    3:52 PM  Readmission Risk Prevention Plan  Transportation Screening Complete Complete Complete  Medication Review (RN Care Manager) Complete Referral to Pharmacy Referral to Pharmacy  PCP or Specialist appointment within 3-5 days of discharge Complete Complete Complete  HRI or Home Care Consult Complete Complete Complete  SW Recovery Care/Counseling Consult  Complete Complete  Palliative Care Screening Not Applicable Not Applicable Not Applicable  Skilled Nursing Facility Not Applicable Not Applicable Not Applicable

## 2024-10-04 NOTE — Inpatient Diabetes Management (Signed)
 Inpatient Diabetes Program Recommendations  AACE/ADA: New Consensus Statement on Inpatient Glycemic Control (2015)  Target Ranges:  Prepandial:   less than 140 mg/dL      Peak postprandial:   less than 180 mg/dL (1-2 hours)      Critically ill patients:  140 - 180 mg/dL   Lab Results  Component Value Date   GLUCAP 382 (H) 10/04/2024   HGBA1C 6.9 (A) 05/22/2024    Review of Glycemic Control  Latest Reference Range & Units 10/04/24 00:07 10/04/24 07:44  Glucose-Capillary 70 - 99 mg/dL 855 (H) 617 (H)   Diabetes history: DM 2 Outpatient Diabetes medications:  Dexcom G7 Baqsimi  prn Lantus  58 units q HS Humalog  15 units tid with meals Current orders for Inpatient glycemic control:  Novolog  0-15 units tid with meals and HS Semglee  50 units daily Solumedrol 40 mg IV daily  Inpatient Diabetes Program Recommendations:    Please consider adding Novolog  meal coverage 5 units tid with meals (hold if patient eats less than 50% or NPO).   Thanks,  Randall Bullocks, RN, BC-ADM Inpatient Diabetes Coordinator Pager [phone]  (8a-5p)

## 2024-10-04 NOTE — Hospital Course (Signed)
 Patient with PMH of paroxysmal A-fib on Eliquis , CKD stage IV, chronic HFrEF, EF 35%, CAD, HLD, PAD, active smoker, hypothyroidism, COPD, gout, recurrent GI bleed presented to the hospital with complaints of cough and shortness of breath. Currently been treated for acute on chronic HFrEF as well as GI bleed.  Assessment and Plan: Upper GI bleed with melena. Presented with epigastric pain and 2 episodes of black tarry stool. Similar presentation in May 2025 at which time EGD and colonoscopy did not show any evidence of active bleeding. [facility] was consulted. Currently no plans for endoscopic evaluation given her tenuous cardiopulmonary status and stable hemoglobin. Recommend capsule endoscopy if she bleeds or has a drop in hemoglobin. Transfuse for hemoglobin less than 8. Currently continuing PPI twice daily.  IV.  Acute on chronic HFrEF. Essential hypertension EF 35 to 40% Severe mitral valve regurgitation. Presents with complaints of shortness of breath as well as lower extremity swelling. Chest x-ray showed vascular congestion. Currently receiving IV Lasix  80 mg twice daily. Home regimen torsemide  60 mg twice daily. Sees advanced heart failure service outpatient. Continue BiDil  and hold Toprol -XL Echocardiogram EF 30 to 35%.  Global hypokinesis.  Severe mitral regurgitation. Cardiology consulted.  Will follow recommendation.  Elevated troponin. History of CAD. Atypical left-sided chest pain. Likely demand ischemia in the setting of heart failure and COPD exacerbation. Troponins are trending up from 13-56 but not consistent with ACS territory. EKG negative for any acute ischemia as well. Patient does have intermittent chest pain on the left side, known producible.  But burning in nature. Has chronic LBBB. Echocardiogram also performed which also does not show any evidence of new wall motion pulmonary. Cardiology consult appreciated. Troponins are trending down after peaking at  377. Not typical for ACS territory. No anticoagulation given presentation with GI bleed. For now monitor clinically.  And treat underlying process.   Acute COPD exacerbation Patient has bilateral expiratory wheezes. Concern for COPD exacerbation although COVID and RVP both negative. Procalcitonin also negative. Started on steroids, will taper off. Continue DuoNebs.  ABG negative for any significant respiratory acidosis or hypercarbia..  Continue BiPAP as needed. Given how sick she is, will provide a short course of antibiotics treatment.   Acute cystitis Nitrite positive UA with pyuria and leukocytosis. Currently on IV ceftriaxone .  Treat for 5 days total. Follow-up on urine cultures.   Paroxysmal atrial fibrillation Rate currently controlled. Holding Toprol -XL. Holding Eliquis  in the setting of GI bleed. Given her hemoglobin is stable, recommended to resume anticoagulation with heparin  without a bolus on 11/23 if remains stable. GI okay with anticoagulation plan   Hyperlipidemia Continue Crestor    Hypothyroidism Continue Synthroid .   History of CAD and PAD Continue Crestor  and Toprol -XL. Holding antiplatelet therapy in the setting of GI bleed.   History of gout Holding allopurinol  in the setting of advanced CKD.   History of chronic tobacco smoking Still smokes 1 pack a day.  Continue nicotine  patch.  Counseled patient for smoking cessation   CKD stage IV Baseline serum creatinine appears to be around 2-2.7. On presentation serum creatinine 2.46. Monitor while receiving diuresis.Generalized anxiety disorder  Depression. Acute public encephalopathy. Metabolic workup appears to be unremarkable. Holding Wellbutrin  XL

## 2024-10-04 NOTE — Telephone Encounter (Signed)
-----   Message from Daphne Barrack sent at 10/04/2024  1:05 PM EST ----- Regarding: RE: rx I am ok to send them now.  If they make a change, the pharmacy would use the newest order.  Thank you!  Brandi ----- Message ----- From: Vicci Roxie CROME, RN Sent: 10/04/2024  12:54 PM EST To: Daphne CROME Barrack, NP Subject: FW: rx                                         Hi Brandi,   Patient is currently admitted. Do you want us  to send refills on Amiodarone  or hold off until discharge from hospital? Not sure if anything might change, or if the hospital provider would send refills at discharge. ----- Message ----- From: Leodis Pagan, Paramedic Sent: 10/03/2024   6:39 PM EST To: [facility] Triage Subject: rx                                             Hey,   The pharmacy is waiting for provider at this office to send in refill of her amio, she will be out of it in a couple days.  Can you send this over to [facility] please?  Thanks, Pagan Leodis, EMT-Paramedic  [phone] 10/03/2024

## 2024-10-04 NOTE — ED Notes (Signed)
 Phlebotomy at bedside due to previous unsuccessful attempts at blood draw/second IV start.

## 2024-10-04 NOTE — Consult Note (Addendum)
 Consultation  Referring Provider: TRH/ Tobie Primary Care Physician:  Campbell Reynolds, NP Primary Gastroenterologist:  Dr.Pyrtle  Reason for Consultation: Melena, anemia in setting of 30 pound weight gain over the past couple of weeks and progressive dyspnea  HPI: [name] is a 63 y.o. female with numerous serious comorbidities, including heart failure with reduced EF 35 to 40%, coronary artery disease, atrial fibrillation for which she is on Eliquis , COPD, hypothyroidism, history of EtOH use/abuse, not clear if active. Patient presented to the emergency room last evening with complaints of swelling in both of her lower extremities, 30 pound weight gain, shortness of breath.  At that time was denying any chest pains or palpitations.  She had also mentioned that she had seen stool over the past couple of days at home. She says she had a day or 2 of black stools about a month ago as well which then resolved.  She says she has been taking occasional BC's and Advil  but not on a daily basis and had just started that over the past few weeks.  Labs in the ER last p.m. with hemoglobin 8.6/hematocrit 29.0/WBC 5.8/platelets 220 BNP 386 BUN 98/creatinine 2.46 LFTs within normal limits Respiratory panel negative UA abnormal/culture pending  Today hemoglobin 8.7/hematocrit 29.7> drifted to 7.9  Hemoglobin 1 month ago was 7.7  Chest x-ray shows mild cardiomegaly and pulmonary congestion consistent with CHF Echo is pending  She has been hemodynamically stable overnight, no bowel movements.  She has no complaints of abdominal pain or cramping no nausea or vomiting, no heartburn or indigestion no recent changes in bowel habits. This morning she continues to feel dyspneic, still wheezing some and says she has been having a squeezing type of pressure discomfort in her upper chest and around into the back of her neck.  Patient has had recent GI evaluation also for melena with EGD and colonoscopy.   EGD was done on 03/13/2024 which showed some patchy white plaques in the esophagus but was an otherwise negative exam Colonoscopy on 03/14/2024 with 7 polyps removed there was 1 large polyp 20 mm with complete resection in the sigmoid colon.  Path showed all of the polyps to be tubular adenomas. She has been on Protonix  daily Last dose of Eliquis  yesterday a.m.   Past Medical History:  Diagnosis Date   Acute renal failure    Acute respiratory failure with hypoxia ([name]) 12/02/2019   Alcohol abuse    Alcoholic cirrhosis ([name])    Anemia    Anxiety    Arthritis    knees (11/26/2018)   B12 deficiency    CAD (coronary artery disease)    a. 11/10/2014 Cath: LM nl, LAD min irregs, D1 30 ost, D2 50d, LCX 29m, OM1 80 p/m (1.5 mm vessel), OM2 59m, RCA nondom 27m-->med rx.. Demand ischemia in the setting of rapid a-fib.   Cardiomyopathy ([name])    Carotid artery disease    a. 01/2015 Carotid Angio: RICA 100, LICA 95p; b. 02/2015 s/p L CEA; c. 05/2019 Carotid U/S: RICA 100. RECA >50. LICA 1-39%.   Cellulitis    lower extremities   Cellulitis in diabetic foot ([name]) 07/08/2019   CHF (congestive heart failure) ([name])    Chronic combined systolic and diastolic CHF (congestive heart failure) ([name])    a. 10/2014 Echo: EF 40-45%; b. 10/2018 Echo: EF 45-50%, gr2 DD; c. 11/2019 Echo: EF 50%, mild LVH, gr2 DD (restrictive), antlat HK, Nl RV fxn. Mild BAE. RVSP 59.31mmHg.   CKD (  chronic kidney disease), stage III ([name])    Cocaine abuse ([name])    COPD (chronic obstructive pulmonary disease) ([name])    Critical ischemia of foot ([name]) 06/07/2021   Critical limb ischemia     Critical lower limb ischemia ([name])    Demand ischemia ([name]) 10/29/2014   Depression    Diabetes mellitus without complication ([name])    Diabetic peripheral neuropathy ([name])    DVT (deep venous thrombosis) ([name])    Dyspnea    Elevated troponin    a. Chronic elevation.   Fall 05/05/2021   Femoro-popliteal artery disease    Peripheral arterial  disease/critical limb ischemia     GERD (gastroesophageal reflux disease)    Hyperlipemia    Hypertension    Hypokalemia    Hypomagnesemia    Hypothyroidism    Marijuana abuse    Narcotic abuse ([name])    Noncompliance    NSVT (nonsustained ventricular tachycardia) ([name])    Obesity    Osteomyelitis ([name]) 06/21/2019   PAF (paroxysmal atrial fibrillation) ([name])    Paroxysmal atrial tachycardia    Peripheral arterial disease    a. 01/2015 Angio/PTA: RSFA 99 (atherectomy/pta) - 1 vessel runoff via diff dzs peroneal; b. 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   Pneumonia    once or twice (11/26/2018)   Poorly controlled type 2 diabetes mellitus ([name])    Renal disorder    Renal insufficiency    a. 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   SIRS (systemic inflammatory response syndrome) ([name]) 04/06/2017   Sleep apnea    couldn't handle wearing the mask (11/26/2018)   Symptomatic bradycardia    a. Avoid AV blocking agent per EP. Prev req temp wire in 2017.   Tobacco abuse    Wrist fracture, left, closed, initial encounter 01/29/2015    Past Surgical History:  Procedure Laterality Date   ABDOMINAL AORTOGRAM N/A 06/26/2019   Procedure: ABDOMINAL AORTOGRAM;  Surgeon: Darron Deatrice LABOR, MD;  Location: [facility] INVASIVE CV LAB;  Service: Cardiovascular;  Laterality: N/A;   ABDOMINAL AORTOGRAM W/LOWER EXTREMITY N/A 06/07/2021   Procedure: ABDOMINAL AORTOGRAM W/LOWER EXTREMITY;  Surgeon: Court Dorn PARAS, MD;  Location: [facility];  Service: Cardiovascular;  Laterality: N/A;   ABDOMINAL AORTOGRAM W/LOWER EXTREMITY N/A 08/11/2023   Procedure: ABDOMINAL AORTOGRAM W/LOWER EXTREMITY;  Surgeon: Magda Debby SAILOR, MD;  Location: [facility];  Service: Cardiovascular;  Laterality: N/A;   AMPUTATION Right 06/14/2017   Procedure: Right foot transmetatarsal amputation;  Surgeon: Harden Jerona GAILS, MD;  Location: [facility] OR;  Service: Orthopedics;  Laterality: Right;   AMPUTATION Left 06/28/2019    Procedure: AMPUTATION LEFT FIFTH TOE;  Surgeon: Oris Krystal FALCON, MD;  Location: [facility] OR;  Service: Vascular;  Laterality: Left;   AMPUTATION TOE Right 04/28/2017   Procedure: AMPUTATION OF RIGHT SECOND RAY;  Surgeon: Harden Jerona GAILS, MD;  Location: [facility] OR;  Service: Orthopedics;  Laterality: Right;   CARDIAC CATHETERIZATION     CARDIAC CATHETERIZATION N/A 01/12/2016   Procedure: Temporary Wire;  Surgeon: Lynwood Schilling, MD;  Location: [facility];  Service: Cardiovascular;  Laterality: N/A;   CARDIOVERSION  ~ 02/2013   twice    CARDIOVERSION N/A 09/26/2023   Procedure: CARDIOVERSION (CATH LAB);  Surgeon: Alvan Ronal BRAVO, MD;  Location: [facility] INVASIVE CV LAB;  Service: Cardiovascular;  Laterality: N/A;   CAROTID ANGIOGRAM N/A 01/15/2015   Procedure: CAROTID ANGIOGRAM;  Surgeon: Dorn PARAS Court, MD;  Location: [facility] CATH  LAB;  Service: Cardiovascular;  Laterality: N/A;   COLONOSCOPY N/A 03/14/2024   Procedure: COLONOSCOPY;  Surgeon: Albertus Gordy HERO, MD;  Location: [facility] ENDOSCOPY;  Service: Gastroenterology;  Laterality: N/A;   DILATION AND CURETTAGE OF UTERUS  1988   ENDARTERECTOMY Left 02/19/2015   Procedure: LEFT CAROTID ENDARTERECTOMY ;  Surgeon: Gaile LELON New, MD;  Location: [facility] OR;  Service: Vascular;  Laterality: Left;   ESOPHAGOGASTRODUODENOSCOPY N/A 03/13/2024   Procedure: EGD (ESOPHAGOGASTRODUODENOSCOPY);  Surgeon: Albertus Gordy HERO, MD;  Location: [facility] ENDOSCOPY;  Service: Gastroenterology;  Laterality: N/A;   FEMORAL-TIBIAL BYPASS GRAFT Left 06/28/2019   Procedure: BYPASS GRAFT LEFT LEG FEMORAL TO ANTERIOR TIBIAL ARTERY using LEFT GREATER SAPHENOUS VEIN;  Surgeon: Oris Krystal FALCON, MD;  Location: [facility] OR;  Service: Vascular;  Laterality: Left;   LEFT HEART CATHETERIZATION WITH CORONARY ANGIOGRAM N/A 10/31/2014   Procedure: LEFT HEART CATHETERIZATION WITH CORONARY ANGIOGRAM;  Surgeon: Lonni JONETTA Cash, MD;  Location: [facility] CATH LAB;  Service: Cardiovascular;  Laterality: N/A;   LOWER EXTREMITY ANGIOGRAM N/A  09/10/2013   Procedure: LOWER EXTREMITY ANGIOGRAM;  Surgeon: Dorn JINNY Lesches, MD;  Location: [facility] CATH LAB;  Service: Cardiovascular;  Laterality: N/A;   LOWER EXTREMITY ANGIOGRAM N/A 01/15/2015   Procedure: LOWER EXTREMITY ANGIOGRAM;  Surgeon: Dorn JINNY Lesches, MD;  Location: [facility] CATH LAB;  Service: Cardiovascular;  Laterality: N/A;   LOWER EXTREMITY ANGIOGRAPHY N/A 04/13/2017   Procedure: Lower Extremity Angiography;  Surgeon: Lesches Dorn JINNY, MD;  Location: [facility] INVASIVE CV LAB;  Service: Cardiovascular;  Laterality: N/A;   LOWER EXTREMITY ANGIOGRAPHY Left 06/26/2019   Procedure: LOWER EXTREMITY ANGIOGRAPHY;  Surgeon: Darron Deatrice LABOR, MD;  Location: [facility];  Service: Cardiovascular;  Laterality: Left;   PERIPHERAL VASCULAR ATHERECTOMY Right 06/07/2021   Procedure: PERIPHERAL VASCULAR ATHERECTOMY;  Surgeon: Lesches Dorn JINNY, MD;  Location: [facility] INVASIVE CV LAB;  Service: Cardiovascular;  Laterality: Right;   PERIPHERAL VASCULAR BALLOON ANGIOPLASTY Left 06/26/2019   Procedure: PERIPHERAL VASCULAR BALLOON ANGIOPLASTY;  Surgeon: Darron Deatrice LABOR, MD;  Location: [facility];  Service: Cardiovascular;  Laterality: Left;  unable to cross lt sfa occlusion   PERIPHERAL VASCULAR INTERVENTION  04/13/2017   Procedure: Peripheral Vascular Intervention;  Surgeon: Lesches Dorn JINNY, MD;  Location: [facility] INVASIVE CV LAB;  Service: Cardiovascular;;   PERIPHERAL VASCULAR INTERVENTION  08/11/2023   Procedure: PERIPHERAL VASCULAR INTERVENTION;  Surgeon: Magda Debby SAILOR, MD;  Location: [facility];  Service: Cardiovascular;;   PRESSURE SENSOR/CARDIOMEMS N/A 02/05/2020   Procedure: PRESSURE SENSOR/CARDIOMEMS;  Surgeon: Rolan Ezra RAMAN, MD;  Location: [facility] INVASIVE CV LAB;  Service: Cardiovascular;  Laterality: N/A;   VEIN HARVEST Left 06/28/2019   Procedure: LEFT LEG GREATER SAPHENOUS VEIN HARVEST;  Surgeon: Oris Krystal FALCON, MD;  Location: [facility] OR;  Service: Vascular;  Laterality: Left;    Prior to  Admission medications   Medication Sig Start Date End Date Taking? Authorizing Provider  acetaminophen  (TYLENOL ) 500 MG tablet Take 1,000 mg by mouth every 6 (six) hours as needed for headache (pain).   Yes [provider]  albuterol  (VENTOLIN  HFA) 108 (90 Base) MCG/ACT inhaler USE 2 PUFFS BY MOUTH EVERY FOUR HOURS, AS NEEDED, FOR COUGHING/WHEEZING 07/12/23  Yes Newlin, [name], MD  allopurinol  (ZYLOPRIM ) 100 MG tablet Take 100 mg by mouth daily.   Yes [provider]  amiodarone  (PACERONE ) 200 MG tablet Take 0.5 tablets (100 mg total) by mouth daily. 04/29/24  Yes Ollis, Daphne CROME, NP  budesonide -formoterol  (SYMBICORT ) 160-4.5 MCG/ACT inhaler Inhale  2 puffs into the lungs 2 (two) times daily. 07/12/23  Yes Newlin, [name], MD  buPROPion  (WELLBUTRIN  XL) 300 MG 24 hr tablet Take 300 mg by mouth every morning. 02/14/24  Yes [provider]  Cholecalciferol  (VITAMIN D3) 50 MCG (2000 UT) capsule Take 1 capsule by mouth every morning.   Yes [provider]  colchicine  0.6 MG tablet Take 1.2 mg by mouth See admin instructions. Take 1.2 mg for flair up and an hour later take 0.6 mg if needed   Yes [provider]  docusate sodium  (COLACE) 100 MG capsule Take 2 capsules (200 mg total) by mouth at bedtime. 03/17/24  Yes Regalado, Belkys A, MD  ELIQUIS  5 MG TABS tablet TAKE 1 TABLET (5 MG TOTAL) BY MOUTH 2 (TWO) TIMES DAILY. 06/10/24  Yes Clegg, [name] D, NP  folic acid  (FOLVITE ) 1 MG tablet Take 1 tablet (1 mg total) by mouth daily. 03/17/24  Yes Regalado, Belkys A, MD  insulin  glargine (LANTUS ) 100 UNIT/ML injection Inject 58 Units into the skin at bedtime.   Yes [provider]  insulin  lispro (HUMALOG ) 100 UNIT/ML KwikPen Inject 15 Units into the skin 3 (three) times daily with meals. Plus sliding scale, max dose 70 units /day 03/17/24  Yes Regalado, Belkys A, MD  isosorbide -hydrALAZINE  (BIDIL ) 20-37.5 MG tablet Take 1 tablet by mouth 3 (three) times daily. 05/21/24  Yes  [place], Harlene HERO, FNP  levothyroxine  (SYNTHROID ) 50 MCG tablet TAKE 1 TABLET (50 MCG TOTAL) BY MOUTH DAILY BEFORE BREAKFAST (AM) 05/02/23  Yes Newlin, [name], MD  Menthol -Camphor (ICY HOT PRO NO MESS EX) Apply 1 Application topically 3 (three) times daily as needed (arms, neuropathy lower extrimity, gout in hand.).   Yes [provider]  metolazone  (ZAROXOLYN ) 2.5 MG tablet Take 2.5mg  metolazone  every other day X 3 doses. Then PRN when advised by provider 10/03/24  Yes [place] A, FNP  metoprolol  succinate (TOPROL -XL) 50 MG 24 hr tablet Take 1 tablet (50 mg total) by mouth daily. 08/19/24  Yes Rolan Ezra RAMAN, MD  NYAMYC  powder Apply 1 Application topically 2 (two) times daily. 02/09/24  Yes [provider]  nystatin  cream (MYCOSTATIN ) Apply 1 Application topically 2 (two) times daily as needed for dry skin. 02/09/24  Yes [provider]  Omega-3 Fatty Acids (FISH OIL PO) Take 1 tablet by mouth daily.   Yes [provider]  pantoprazole  (PROTONIX ) 40 MG tablet TAKE 1 TABLET (40 MG TOTAL) BY MOUTH DAILY(AM) 05/02/23  Yes Newlin, [name], MD  polyethylene glycol (MIRALAX  / GLYCOLAX ) 17 g packet Take 17 g by mouth daily as needed for moderate constipation. 08/05/23  Yes Akula, Vijaya, MD  potassium chloride  SA (KLOR-CON  M) 20 MEQ tablet Take 2 tablets (40 mEq total) by mouth every morning AND 1 tablet (20 mEq total) every evening. 08/19/24  Yes Rolan Ezra RAMAN, MD  rosuvastatin  (CRESTOR ) 40 MG tablet Take 1 tablet (40 mg total) by mouth daily. 04/04/24  Yes Rolan Ezra RAMAN, MD  tirzepatide  (MOUNJARO ) 7.5 MG/0.5ML Pen Inject 7.5 mg into the skin once a week. Patient taking differently: Inject 7.5 mg into the skin every Friday. 05/22/24  Yes Motwani, Komal, MD  torsemide  (DEMADEX ) 20 MG tablet Take 3 tablets (60 mg total) by mouth every morning AND 2 tablets (40 mg total) every evening. 09/16/24 12/15/24 Yes Hayes Beckey CROME, NP  VITAMIN E PO Take 1 tablet by mouth daily.    Yes [provider]  Continuous Glucose Receiver (DEXCOM G7 RECEIVER)  DEVI 1 Device by Does not apply route continuous. 06/25/24   Motwani, Komal, MD  Continuous Glucose Sensor (DEXCOM G7 SENSOR) MISC 1 Device by Does not apply route continuous. 06/25/24   Motwani, Komal, MD  Glucagon  (BAQSIMI  ONE PACK) 3 MG/DOSE POWD Place 1 Device into the nose as needed (Low blood sugar with impaired consciousness). 05/22/24   Motwani, Komal, MD  tiotropium (SPIRIVA  HANDIHALER) 18 MCG inhalation capsule Place 1 capsule (18 mcg total) into inhaler and inhale every morning. 12/09/19 02/09/21  Sebastian Toribio GAILS, MD    Current Facility-Administered Medications  Medication Dose Route Frequency Provider Last Rate Last Admin   0.9 %  sodium chloride  infusion (Manually program via Guardrails IV Fluids)   Intravenous Once Sundil, Subrina, MD       0.9 %  sodium chloride  infusion  250 mL Intravenous PRN Sundil, Subrina, MD       acetaminophen  (TYLENOL ) tablet 650 mg  650 mg Oral Q6H PRN Sundil, Subrina, MD   650 mg at 10/04/24 0407   Or   acetaminophen  (TYLENOL ) suppository 650 mg  650 mg Rectal Q6H PRN Sundil, Subrina, MD       amiodarone  (PACERONE ) tablet 100 mg  100 mg Oral Daily Sundil, Subrina, MD       azithromycin  (ZITHROMAX ) 500 mg in sodium chloride  0.9 % 250 mL IVPB  500 mg Intravenous Q24H Sundil, Subrina, MD   Stopped at 10/04/24 0301   buPROPion  (WELLBUTRIN  XL) 24 hr tablet 300 mg  300 mg Oral q morning Sundil, Subrina, MD       cefTRIAXone  (ROCEPHIN ) 1 g in sodium chloride  0.9 % 100 mL IVPB  1 g Intravenous Q24H Sundil, Subrina, MD   Stopped at 10/04/24 9147   fentaNYL  (SUBLIMAZE ) injection 12.5 mcg  12.5 mcg Intravenous Q2H PRN Patel, Pranav M, MD       furosemide  (LASIX ) injection 80 mg  80 mg Intravenous BID Sundil, Subrina, MD   80 mg at 10/04/24 9187   insulin  aspart (novoLOG ) injection 0-15 Units  0-15 Units Subcutaneous TID WC Sundil, Subrina, MD   15 Units at 10/04/24 9188   insulin  aspart  (novoLOG ) injection 0-5 Units  0-5 Units Subcutaneous QHS Sundil, Subrina, MD       insulin  glargine-yfgn (SEMGLEE ) injection 50 Units  50 Units Subcutaneous Daily Sundil, Subrina, MD       ipratropium-albuterol  (DUONEB) 0.5-2.5 (3) MG/3ML nebulizer solution 3 mL  3 mL Nebulization Q6H Sundil, Subrina, MD   3 mL at 10/04/24 9176   ipratropium-albuterol  (DUONEB) 0.5-2.5 (3) MG/3ML nebulizer solution 3 mL  3 mL Nebulization Q4H PRN Sundil, Subrina, MD       isosorbide -hydrALAZINE  (BIDIL ) 20-37.5 MG per tablet 1 tablet  1 tablet Oral TID Sundil, Subrina, MD       levothyroxine  (SYNTHROID ) tablet 50 mcg  50 mcg Oral Q0600 Sundil, Subrina, MD   50 mcg at 10/04/24 9090   methylPREDNISolone  sodium succinate (SOLU-MEDROL ) 40 mg/mL injection 40 mg  40 mg Intravenous Daily Sundil, Subrina, MD       nicotine  (NICODERM CQ  - dosed in mg/24 hours) patch 14 mg  14 mg Transdermal Daily Sundil, Subrina, MD       ondansetron  (ZOFRAN ) tablet 4 mg  4 mg Oral Q6H PRN Sundil, Subrina, MD       Or   ondansetron  (ZOFRAN ) injection 4 mg  4 mg Intravenous Q6H PRN Sundil, Subrina, MD   4 mg at 10/04/24 0254   pantoprazole  (PROTONIX ) injection 40 mg  40 mg Intravenous Q12H Sundil, Subrina, MD       rosuvastatin  (CRESTOR ) tablet 40 mg  40 mg Oral Daily Sundil, Subrina, MD       sodium chloride  flush (NS) 0.9 % injection 10-40 mL  10-40 mL Intracatheter PRN Sundil, Subrina, MD       sodium chloride  flush (NS) 0.9 % injection 3 mL  3 mL Intravenous Q12H Sundil, Subrina, MD   3 mL at 10/04/24 0010   sodium chloride  flush (NS) 0.9 % injection 3 mL  3 mL Intravenous PRN Sundil, Subrina, MD        Allergies as of 10/03/2024 - Review Complete 10/03/2024  Allergen Reaction Noted   Neurontin  [gabapentin ] Nausea And Vomiting and Other (See Comments) 08/11/2016   Lyrica  [pregabalin ] Other (See Comments) 06/13/2017    Family History  Problem Relation Age of Onset   Hypertension Mother    Diabetes Mother    Cancer Mother         breast, ovarian, colon   Clotting disorder Mother    Heart disease Mother    Heart attack Mother    Breast cancer Mother        in 30's   Hypertension Father    Heart disease Father    Emphysema Sister        smoked    Social History   Socioeconomic History   Marital status: Single    Spouse name: Not on file   Number of children: 1   Years of education: 12   Highest education level: 12th grade  Occupational History   Occupation: disabled  Tobacco Use   Smoking status: Every Day    Current packs/day: 1.00    Average packs/day: 1 pack/day for 44.0 years (44.0 ttl pk-yrs)    Types: E-cigarettes, Cigarettes   Smokeless tobacco: Former    Types: Snuff  Vaping Use   Vaping status: Former   Devices: 11/26/2018 stopped months ago  Substance and Sexual Activity   Alcohol use: Not Currently    Comment: occ   Drug use: Yes    Types: Crack cocaine, Marijuana, Oxycodone    Sexual activity: Not Currently  Other Topics Concern   Not on file  Social History Narrative   ** Merged History Encounter **       Lives in [place], in motel with sister.  They are looking to move but don't have a place to go yet.     Social Drivers of Corporate Investment Banker Strain: Low Risk  (02/09/2023)   Overall Financial Resource Strain (CARDIA)    Difficulty of Paying Living Expenses: Not hard at all  Food Insecurity: No Food Insecurity (03/09/2024)   Hunger Vital Sign    Worried About Running Out of Food in the Last Year: Never true    Ran Out of Food in the Last Year: Never true  Transportation Needs: Unmet Transportation Needs (03/09/2024)   PRAPARE - Transportation    Lack of Transportation (Medical): Yes    Lack of Transportation (Non-Medical): Yes  Physical Activity: Inactive (02/09/2023)   Exercise Vital Sign    Days of Exercise per Week: 0 days    Minutes of Exercise per Session: 0 min  Stress: No Stress Concern Present (02/09/2023)   [employer] of Occupational Health -  Occupational Stress Questionnaire    Feeling of Stress : Not at all  Social Connections: Unknown (08/21/2023)   Received from [employer]   Social Network    Social Network:  Not on file  Intimate Partner Violence: Not At Risk (03/09/2024)   Humiliation, Afraid, Rape, and Kick questionnaire    Fear of Current or Ex-Partner: No    Emotionally Abused: No    Physically Abused: No    Sexually Abused: No    Review of Systems: Pertinent positive and negative review of systems were noted in the above HPI section.  All other review of systems was otherwise negative.   Physical Exam: Vital signs in last 24 hours: Temp:  [97.7 F (36.5 C)-98 F (36.7 C)] 97.7 F (36.5 C) (11/21 0918) Pulse Rate:  [56-79] 79 (11/21 0918) Resp:  [15-22] 22 (11/21 0918) BP: (104-151)/(41-122) 141/80 (11/21 0918) SpO2:  [94 %-100 %] 94 % (11/21 0918)   General:   Alert,  Well-developed, ill-appearing older white female pleasant and cooperative in NAD, some increased work of breathing, complaining of chest pressure Head:  Normocephalic and atraumatic. Eyes:  Sclera clear, no icterus.   Conjunctiva pink. Ears:  Normal auditory acuity. Nose:  No deformity, discharge,  or lesions. Mouth:  No deformity or lesions.   Neck:  Supple; no masses or thyromegaly. Lungs: Scattered rales, occasional wheeze  heart:  irRegular rate and rhythm; no murmurs, clicks, rubs,  or gallops. Abdomen:  Soft,nontender, BS active,nonpalp mass or hsm.   Rectal: Not done-very scant amount of melenic stool on rectal exam per ER MD Msk:  Symmetrical without gross deformities. . Pulses:  Normal pulses noted. Extremities: 1-2+ edema bilateral lower extremities, has had partial amputation of the right foot Neurologic:  Alert and  oriented x4;  grossly normal neurologically. Skin:  Intact without significant lesions or rashes.. Psych:  Alert and cooperative. Normal mood and affect.  Intake/Output from previous day: 11/20 0701 - 11/21  0700 In: 100.1 [IV Piggyback:100.1] Out: -  Intake/Output this shift: Total I/O In: -  Out: 700 [Urine:700]  Lab Results: Recent Labs    10/03/24 1142 10/04/24 0000 10/04/24 0425 10/04/24 0430  WBC 5.8  --  8.3  --   HGB 8.6* 8.7* 8.0* 7.9*  HCT 29.0* 29.2* 26.6* 26.4*  PLT 220  --  238  --    BMET Recent Labs    10/03/24 1142 10/04/24 0425  NA 142 139  K 5.0 4.7  CL 103 102  CO2 22 24  GLUCOSE 210* 315*  BUN 98* 99*  CREATININE 2.46* 2.45*  CALCIUM  8.4* 8.1*   LFT Recent Labs    10/04/24 0425  PROT 6.6  ALBUMIN  3.4*  AST 24  ALT 20  ALKPHOS 90  BILITOT 0.6   PT/INR No results for input(s): LABPROT, INR in the last 72 hours. Hepatitis Panel No results for input(s): HEPBSAG, HCVAB, HEPAIGM, HEPBIGM in the last 72 hours.   IMPRESSION:  #70 63 year old white female admitted last evening after presenting with progressive dyspnea and 30 pound weight gain over the past few weeks, bilateral lower extremity edema Workup consistent with acute on chronic CHF Known heart failure with reduced EF 35 to 40%  #2 chest pain pressure this a.m.-probable demand ischemia in setting of acute CHF  #3 anemia-chronic #4 complaints of melena x 2 days-hemoglobin actually stable over the past month Patient has been taking intermittent NSAIDs and BC powders- Consider ulcer disease/gastropathy, versus GI blood loss secondary to small bowel source i.e. AVMs. Has had recent GI workup May 2025 also for melena with unremarkable EGD and colonoscopy with removal of multiple tubular adenomatous polyps but no findings to explain melena.  #5  chronic kidney disease stage IV #6.  Coronary artery disease #7.  Atrial fibrillation-on chronic Eliquis  #8.  COPD #9.  History of EtOH abuse #10 peripheral vascular disease status post previous carotid endarterectomy  Plan; heart healthy diet Continue to trend hgb and transfuse for hemoglobin less than 7.5 Cover with IV PPI twice  daily Patient advised to stop taking BC powders and NSAIDs, and use only Tylenol  at home GI will follow along, no plans for endoscopic evaluation until she has considerable improvement in congestive heart failure.  May consider EGD/capsule endoscopy depending on her course      Greig Ever RIGGERS 10/04/2024, 9:42 AM    Attending physician's note   I have taken a history, reviewed the chart, and examined the patient. I performed a substantive portion of this encounter, including complete performance of at least one of the key components, in conjunction with the APP. I agree with the APP's note, impression, and recommendations with my edits.   63 year old female with complex medical history as outlined above, to include history of CHFrEF 35-40%, CAD, A-fib (on Eliquis ), COPD, hypothyroidism, admitted with CHF exacerbation, chest pressure.  GI service consulted due to reported dark stools last months which have since resolved.  H/H largely otherwise at baseline.  Was 8.6/29 on admission.  Baseline appears to be ~7-8 recently, but was ~11 prior to admission back in April.  At that time, was admitted with acute on chronic anemia with hemoglobin nadir 5.5.  EGD at that time with Candida esophagitis.  Colonoscopy with 7 subcentimeter adenomas, a 20 mm sigmoid colon adenoma, all resected.  Since then, Hgb has remained ~7-8.  1) CHF exacerbation 2) SOB 3) Lower extremity edema 4) Weight gain - CHF exacerbation management per primary Medicine and consulting Cardiology services - Holding Eliquis  for the time being  5) Acute on chronic anemia Hemoglobin largely stable from what it has been for the last 6 months or so, and no overt bleeding since admission.  Transfused 1 unit RBCs with posttransfusion H/H 9/30. - Continue serial CBCs with additional blood products as needed per protocol - Started on high-dose IV PPI - No plan for endoscopy right now as she does not appear to be actively bleeding and  current CHF exacerbation - Depending on cardiovascular stability, may consider VCE at some point for further small bowel interrogation.  However, this would be with the understanding that she would otherwise be healthy enough to undergo small bowel enteroscopy if VCE study was positive.  Otherwise had EGD and colonoscopy earlier this year for similar indication.   GI service will remain available as needed.  Please do not hesitate to contact us  if change in clinical status.  Otherwise no plan for endoscopic evaluation at this juncture.  A total of 75 minutes of time was spent on this encounter, including in depth chart review, independent review of results as outlined above, communicating results with the patient directly, face-to-face time with the patient, coordinating care, and ordering studies and medications as appropriate, and documentation.   [address] DO, FACG [phone] office

## 2024-10-05 DIAGNOSIS — N184 Chronic kidney disease, stage 4 (severe): Secondary | ICD-10-CM

## 2024-10-05 DIAGNOSIS — I429 Cardiomyopathy, unspecified: Secondary | ICD-10-CM

## 2024-10-05 DIAGNOSIS — R072 Precordial pain: Secondary | ICD-10-CM | POA: Diagnosis not present

## 2024-10-05 DIAGNOSIS — Z79899 Other long term (current) drug therapy: Secondary | ICD-10-CM

## 2024-10-05 DIAGNOSIS — D649 Anemia, unspecified: Secondary | ICD-10-CM

## 2024-10-05 DIAGNOSIS — F17210 Nicotine dependence, cigarettes, uncomplicated: Secondary | ICD-10-CM

## 2024-10-05 DIAGNOSIS — I5021 Acute systolic (congestive) heart failure: Secondary | ICD-10-CM

## 2024-10-05 DIAGNOSIS — R7989 Other specified abnormal findings of blood chemistry: Secondary | ICD-10-CM | POA: Diagnosis not present

## 2024-10-05 DIAGNOSIS — K921 Melena: Secondary | ICD-10-CM

## 2024-10-05 DIAGNOSIS — K922 Gastrointestinal hemorrhage, unspecified: Secondary | ICD-10-CM | POA: Diagnosis not present

## 2024-10-05 LAB — CBC
HCT: 26.9 % — ABNORMAL LOW (ref 36.0–46.0)
HCT: 27.2 % — ABNORMAL LOW (ref 36.0–46.0)
Hemoglobin: 8.1 g/dL — ABNORMAL LOW (ref 12.0–15.0)
Hemoglobin: 8.3 g/dL — ABNORMAL LOW (ref 12.0–15.0)
MCH: 29.3 pg (ref 26.0–34.0)
MCH: 29.5 pg (ref 26.0–34.0)
MCHC: 30.1 g/dL (ref 30.0–36.0)
MCHC: 30.5 g/dL (ref 30.0–36.0)
MCV: 96.1 fL (ref 80.0–100.0)
MCV: 97.8 fL (ref 80.0–100.0)
Platelets: 213 K/uL (ref 150–400)
Platelets: 221 K/uL (ref 150–400)
RBC: 2.75 MIL/uL — ABNORMAL LOW (ref 3.87–5.11)
RBC: 2.83 MIL/uL — ABNORMAL LOW (ref 3.87–5.11)
RDW: 18.8 % — ABNORMAL HIGH (ref 11.5–15.5)
RDW: 18.8 % — ABNORMAL HIGH (ref 11.5–15.5)
WBC: 14.1 K/uL — ABNORMAL HIGH (ref 4.0–10.5)
WBC: 17.8 K/uL — ABNORMAL HIGH (ref 4.0–10.5)
nRBC: 0 % (ref 0.0–0.2)
nRBC: 0 % (ref 0.0–0.2)

## 2024-10-05 LAB — BASIC METABOLIC PANEL WITH GFR
Anion gap: 17 — ABNORMAL HIGH (ref 5–15)
BUN: 97 mg/dL — ABNORMAL HIGH (ref 8–23)
CO2: 23 mmol/L (ref 22–32)
Calcium: 8.1 mg/dL — ABNORMAL LOW (ref 8.9–10.3)
Chloride: 98 mmol/L (ref 98–111)
Creatinine, Ser: 2.41 mg/dL — ABNORMAL HIGH (ref 0.44–1.00)
GFR, Estimated: 22 mL/min — ABNORMAL LOW (ref >60)
Glucose, Bld: 299 mg/dL — ABNORMAL HIGH (ref 70–99)
Potassium: 4.5 mmol/L (ref 3.5–5.1)
Sodium: 138 mmol/L (ref 135–145)

## 2024-10-05 LAB — GLUCOSE, CAPILLARY
Glucose-Capillary: 183 mg/dL — ABNORMAL HIGH (ref 70–99)
Glucose-Capillary: 279 mg/dL — ABNORMAL HIGH (ref 70–99)
Glucose-Capillary: 378 mg/dL — ABNORMAL HIGH (ref 70–99)
Glucose-Capillary: 494 mg/dL — ABNORMAL HIGH (ref 70–99)

## 2024-10-05 LAB — URINE CULTURE
Culture: <10000 — AB
Special Requests: NORMAL

## 2024-10-05 LAB — RAPID URINE DRUG SCREEN, HOSP PERFORMED
Amphetamines: NOT DETECTED
Barbiturates: NOT DETECTED
Benzodiazepines: NOT DETECTED
Cocaine: NOT DETECTED
Opiates: NOT DETECTED
Tetrahydrocannabinol: POSITIVE — AB

## 2024-10-05 LAB — TROPONIN I (HIGH SENSITIVITY)
Troponin I (High Sensitivity): 377 ng/L (ref <18)
Troponin I (High Sensitivity): 335 ng/L (ref <18)
Troponin I (High Sensitivity): 327 ng/L (ref <18)

## 2024-10-05 LAB — BLOOD GAS, ARTERIAL
Acid-Base Excess: 2.2 mmol/L — ABNORMAL HIGH (ref 0.0–2.0)
Bicarbonate: 27.8 mmol/L (ref 20.0–28.0)
O2 Saturation: 97.7 %
Patient temperature: 36.6
pCO2 arterial: 45 mmHg (ref 32–48)
pH, Arterial: 7.4 (ref 7.35–7.45)
pO2, Arterial: 89 mmHg (ref 83–108)

## 2024-10-05 LAB — MAGNESIUM: Magnesium: 2.5 mg/dL — ABNORMAL HIGH (ref 1.7–2.4)

## 2024-10-05 MED ORDER — POLYETHYLENE GLYCOL 3350 17 G PO PACK
17.0000 g | PACK | Freq: Every day | ORAL | Status: DC
  Administered 2024-10-05 – 2024-10-12 (×7): 17 g via ORAL
  Filled 2024-10-05 (×8): qty 1

## 2024-10-05 MED ORDER — AZITHROMYCIN 250 MG PO TABS
500.0000 mg | ORAL_TABLET | Freq: Every day | ORAL | Status: DC
  Administered 2024-10-05: 500 mg via ORAL
  Filled 2024-10-05: qty 2

## 2024-10-05 MED ORDER — SENNOSIDES-DOCUSATE SODIUM 8.6-50 MG PO TABS
2.0000 | ORAL_TABLET | Freq: Two times a day (BID) | ORAL | Status: DC
  Administered 2024-10-05 – 2024-10-12 (×12): 2 via ORAL
  Filled 2024-10-05 (×12): qty 2

## 2024-10-05 MED ORDER — FLUTICASONE PROPIONATE 50 MCG/ACT NA SUSP
1.0000 | Freq: Every day | NASAL | Status: AC
  Administered 2024-10-06 – 2024-10-07 (×2): 1 via NASAL
  Filled 2024-10-05: qty 16

## 2024-10-05 NOTE — Consult Note (Addendum)
 Cardiology Consultation   Patient ID: [name] MRN: [id number]; DOB: Jan 14, 1961  Admit date: 10/03/2024 Date of Consult: 10/05/2024  PCP:  Karla Reynolds, NP   [facility] Providers Cardiologist:  Karla Lesches, MD  Electrophysiologist:  Karla Gladis Norton, MD  Advanced Heart Failure:  Karla Shuck, MD      CC: Abdominal pain and bloody stools Reason of Consult: Elevated troponins  Patient Profile: [name] is a 63 y.o. female with a hx of severe PAD, carotid stenosis s/p left CEA, CAD, chronic systolic CHF, paroxysmal atrial fibrillation on anticoagulation, bradycardia, cirrhosis, hypothyroidism, CKD [Baseline ~2], T2DM, OSA not on CPAP and prior substance abuse [cocaine and EOTH] who is being seen 10/05/2024 for the evaluation of elevated troponin at the request of Karla Blanch MD.  History of Present Illness: [name] has severe PAD s/p multiple interventions to bilateral SFA with fem-pop bypass in 2020. Most recent intervention 07/2023 with femoropopliteal angioplasty and stenting.  Follows with EP for her atrial fibrillation. She is on eliquis  and amiodarone  outpatient. Last DCCV 09/2023.  Recent admission 02/2024 with GIB and acute blood loss anemia. Echocardiogram at that time showed LVEF 35-40% with mild RV dysfunction, PASP 56,and moderate MR.  Patient is seen by advanced heart failure team for her HF, etiology suspected to be NICM 2/2 prior eoth and cocaine use. She was seen by by Dr. Shuck 08/19/24. Patient reported ongoing tobacco use and peripheral neuropathy. At that visit patient was volume up and she was to increase diuresis outpatient. BB was also increased.She followed up with [name] on 09/12/24 and reported feeling better after increased diuresis. Cardiomems at 23. Cardiomems goal 20. Off jardiance  2/2 severe UTIs. Off ARB and MRA 2/2 renal dysfunction.   Presented to ED on 11/20 for abdominal discomfort and bloody stools.  First ECGs  with artifact, unable to assess sinus rhythm though no acute ischemic changes ECG on 11/22 showed sinus rhythm with IVCD VR 96  CXR shows mild cardiomegaly with mild pulmonary vascular congestion  Echo this admission shows LVEF 30-35% with global hypokinesis and distal/septal/inferior basal akinesis. LV moderately dilated. G1 DD. Moderately dilated LA. Moderate to severe MR. Moderate TR.  Pertinent lab work:  Hgb ~8 [has received no blood transfusions this admission ] Cr ~2.4 Albumin  3.2 LFTs WNL BNP 386 UA with evidence of UTI  Troponin 13 -> 23 -> 56 -> 79 -> 377  Patient is admitted and being treated for possible GI bleed, acute cystitis, acute COPD exacerbation, and acute on chronic HFrEF. She is receiving IV albumin  and antibiotics. For diuresis she has received IV lasix  120 mg and lasix  80 mg x3. Net IO Since Admission: -1,842.7 mL [10/05/24 1249]  On interview, on day of arrival she noted chest pain described as an ache to the upper chest, bilateral shoulders, neck, and back. It went away.  Last night she was unable to sleep due to coughing, and this morning chest pain reoccurred.  Improved with IV medications, no chest pain during interview. Denied chest pain prior to admission.  Reported she did not notice shortness of breath leading into admission.   Past Medical History:  Diagnosis Date   Acute renal failure    Acute respiratory failure with hypoxia ([name]) 12/02/2019   Alcohol abuse    Alcoholic cirrhosis ([name])    Anemia    Anxiety    Arthritis    knees (11/26/2018)   B12 deficiency    CAD (coronary artery disease)  a. 11/10/2014 Cath: LM nl, LAD min irregs, D1 30 ost, D2 50d, LCX 34m, OM1 80 p/m (1.5 mm vessel), OM2 55m, RCA nondom 50m-->med rx.. Demand ischemia in the setting of rapid a-fib.   Cardiomyopathy ([name])    Carotid artery disease    a. 01/2015 Carotid Angio: RICA 100, LICA 95p; b. 02/2015 s/p L CEA; c. 05/2019 Carotid U/S: RICA 100. RECA >50. LICA 1-39%.    Cellulitis    lower extremities   Cellulitis in diabetic foot ([name]) 07/08/2019   CHF (congestive heart failure) ([name])    Chronic combined systolic and diastolic CHF (congestive heart failure) ([name])    a. 10/2014 Echo: EF 40-45%; b. 10/2018 Echo: EF 45-50%, gr2 DD; c. 11/2019 Echo: EF 50%, mild LVH, gr2 DD (restrictive), antlat HK, Nl RV fxn. Mild BAE. RVSP 59.33mmHg.   CKD (chronic kidney disease), stage III ([name])    Cocaine abuse ([name])    COPD (chronic obstructive pulmonary disease) ([name])    Critical ischemia of foot ([name]) 06/07/2021   Critical limb ischemia     Critical lower limb ischemia ([name])    Demand ischemia ([name]) 10/29/2014   Depression    Diabetes mellitus without complication ([name])    Diabetic peripheral neuropathy ([name])    DVT (deep venous thrombosis) ([name])    Dyspnea    Elevated troponin    a. Chronic elevation.   Fall 05/05/2021   Femoro-popliteal artery disease    Peripheral arterial disease/critical limb ischemia     GERD (gastroesophageal reflux disease)    Hyperlipemia    Hypertension    Hypokalemia    Hypomagnesemia    Hypothyroidism    Marijuana abuse    Narcotic abuse ([name])    Noncompliance    NSVT (nonsustained ventricular tachycardia) ([name])    Obesity    Osteomyelitis ([name]) 06/21/2019   PAF (paroxysmal atrial fibrillation) ([name])    Paroxysmal atrial tachycardia    Peripheral arterial disease    a. 01/2015 Angio/PTA: RSFA 99 (atherectomy/pta) - 1 vessel runoff via diff dzs peroneal; b. 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   Pneumonia    once or twice (11/26/2018)   Poorly controlled type 2 diabetes mellitus ([name])    Renal disorder    Renal insufficiency    a. 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   SIRS (systemic inflammatory response syndrome) ([name]) 04/06/2017   Sleep apnea    couldn't handle wearing the mask (11/26/2018)   Symptomatic bradycardia    a. Avoid AV blocking agent per EP. Prev req temp wire in 2017.   Tobacco abuse     Wrist fracture, left, closed, initial encounter 01/29/2015    Past Surgical History:  Procedure Laterality Date   ABDOMINAL AORTOGRAM N/A 06/26/2019   Procedure: ABDOMINAL AORTOGRAM;  Surgeon: Darron Deatrice LABOR, MD;  Location: [facility] INVASIVE CV LAB;  Service: Cardiovascular;  Laterality: N/A;   ABDOMINAL AORTOGRAM W/LOWER EXTREMITY N/A 06/07/2021   Procedure: ABDOMINAL AORTOGRAM W/LOWER EXTREMITY;  Surgeon: Court Karla PARAS, MD;  Location: [facility];  Service: Cardiovascular;  Laterality: N/A;   ABDOMINAL AORTOGRAM W/LOWER EXTREMITY N/A 08/11/2023   Procedure: ABDOMINAL AORTOGRAM W/LOWER EXTREMITY;  Surgeon: Magda Debby SAILOR, MD;  Location: [facility] CV [facility];  Service: Cardiovascular;  Laterality: N/A;   AMPUTATION Right 06/14/2017   Procedure: Right foot transmetatarsal amputation;  Surgeon: Harden Jerona GAILS, MD;  Location: [facility] OR;  Service: Orthopedics;  Laterality: Right;   AMPUTATION Left 06/28/2019   Procedure:  AMPUTATION LEFT FIFTH TOE;  Surgeon: Oris Krystal FALCON, MD;  Location: [facility] OR;  Service: Vascular;  Laterality: Left;   AMPUTATION TOE Right 04/28/2017   Procedure: AMPUTATION OF RIGHT SECOND RAY;  Surgeon: Harden Jerona GAILS, MD;  Location: [facility] OR;  Service: Orthopedics;  Laterality: Right;   CARDIAC CATHETERIZATION     CARDIAC CATHETERIZATION N/A 01/12/2016   Procedure: Temporary Wire;  Surgeon: Lynwood Schilling, MD;  Location: [facility];  Service: Cardiovascular;  Laterality: N/A;   CARDIOVERSION  ~ 02/2013   twice    CARDIOVERSION N/A 09/26/2023   Procedure: CARDIOVERSION (CATH LAB);  Surgeon: Alvan Ronal BRAVO, MD;  Location: [facility] INVASIVE CV LAB;  Service: Cardiovascular;  Laterality: N/A;   CAROTID ANGIOGRAM N/A 01/15/2015   Procedure: CAROTID ANGIOGRAM;  Surgeon: Karla JINNY Lesches, MD;  Location: [facility] CATH LAB;  Service: Cardiovascular;  Laterality: N/A;   COLONOSCOPY N/A 03/14/2024   Procedure: COLONOSCOPY;  Surgeon: Albertus Gordy HERO, MD;  Location: [facility] ENDOSCOPY;  Service:  Gastroenterology;  Laterality: N/A;   DILATION AND CURETTAGE OF UTERUS  1988   ENDARTERECTOMY Left 02/19/2015   Procedure: LEFT CAROTID ENDARTERECTOMY ;  Surgeon: Gaile LELON New, MD;  Location: [facility] OR;  Service: Vascular;  Laterality: Left;   ESOPHAGOGASTRODUODENOSCOPY N/A 03/13/2024   Procedure: EGD (ESOPHAGOGASTRODUODENOSCOPY);  Surgeon: Albertus Gordy HERO, MD;  Location: [facility] ENDOSCOPY;  Service: Gastroenterology;  Laterality: N/A;   FEMORAL-TIBIAL BYPASS GRAFT Left 06/28/2019   Procedure: BYPASS GRAFT LEFT LEG FEMORAL TO ANTERIOR TIBIAL ARTERY using LEFT GREATER SAPHENOUS VEIN;  Surgeon: Oris Krystal FALCON, MD;  Location: [facility] OR;  Service: Vascular;  Laterality: Left;   LEFT HEART CATHETERIZATION WITH CORONARY ANGIOGRAM N/A 10/31/2014   Procedure: LEFT HEART CATHETERIZATION WITH CORONARY ANGIOGRAM;  Surgeon: Lonni JONETTA Cash, MD;  Location: [facility] CATH LAB;  Service: Cardiovascular;  Laterality: N/A;   LOWER EXTREMITY ANGIOGRAM N/A 09/10/2013   Procedure: LOWER EXTREMITY ANGIOGRAM;  Surgeon: Karla JINNY Lesches, MD;  Location: [facility] CATH LAB;  Service: Cardiovascular;  Laterality: N/A;   LOWER EXTREMITY ANGIOGRAM N/A 01/15/2015   Procedure: LOWER EXTREMITY ANGIOGRAM;  Surgeon: Karla JINNY Lesches, MD;  Location: [facility] CATH LAB;  Service: Cardiovascular;  Laterality: N/A;   LOWER EXTREMITY ANGIOGRAPHY N/A 04/13/2017   Procedure: Lower Extremity Angiography;  Surgeon: Price Karla JINNY, MD;  Location: [facility] INVASIVE CV LAB;  Service: Cardiovascular;  Laterality: N/A;   LOWER EXTREMITY ANGIOGRAPHY Left 06/26/2019   Procedure: LOWER EXTREMITY ANGIOGRAPHY;  Surgeon: Darron Deatrice LABOR, MD;  Location: [facility];  Service: Cardiovascular;  Laterality: Left;   PERIPHERAL VASCULAR ATHERECTOMY Right 06/07/2021   Procedure: PERIPHERAL VASCULAR ATHERECTOMY;  Surgeon: Price Karla JINNY, MD;  Location: [facility] INVASIVE CV LAB;  Service: Cardiovascular;  Laterality: Right;   PERIPHERAL VASCULAR BALLOON ANGIOPLASTY Left 06/26/2019    Procedure: PERIPHERAL VASCULAR BALLOON ANGIOPLASTY;  Surgeon: Darron Deatrice LABOR, MD;  Location: [facility] CV [facility];  Service: Cardiovascular;  Laterality: Left;  unable to cross lt sfa occlusion   PERIPHERAL VASCULAR INTERVENTION  04/13/2017   Procedure: Peripheral Vascular Intervention;  Surgeon: Price Karla JINNY, MD;  Location: [facility] INVASIVE CV LAB;  Service: Cardiovascular;;   PERIPHERAL VASCULAR INTERVENTION  08/11/2023   Procedure: PERIPHERAL VASCULAR INTERVENTION;  Surgeon: Magda Debby SAILOR, MD;  Location: [facility]VASIVE CV LAB;  Service: Cardiovascular;;   PRESSURE SENSOR/CARDIOMEMS N/A 02/05/2020   Procedure: PRESSURE SENSOR/CARDIOMEMS;  Surgeon: Rolan Karla RAMAN, MD;  Location: [facility] INVASIVE CV LAB;  Service: Cardiovascular;  Laterality: N/A;   VEIN HARVEST Left  06/28/2019   Procedure: LEFT LEG GREATER SAPHENOUS VEIN HARVEST;  Surgeon: Oris Krystal FALCON, MD;  Location: [facility] OR;  Service: Vascular;  Laterality: Left;       Scheduled Meds:  sodium chloride    Intravenous Once   amiodarone   100 mg Oral Daily   budesonide  (PULMICORT ) nebulizer solution  0.25 mg Nebulization BID   fluticasone   1 spray Each Nare Daily   furosemide   80 mg Intravenous BID   insulin  aspart  0-15 Units Subcutaneous TID WC   insulin  aspart  0-5 Units Subcutaneous QHS   insulin  glargine-yfgn  50 Units Subcutaneous Daily   ipratropium-albuterol   3 mL Nebulization Q6H   isosorbide -hydrALAZINE   1 tablet Oral TID   levothyroxine   50 mcg Oral Q0600   methylPREDNISolone  (SOLU-MEDROL ) injection  40 mg Intravenous Daily   nicotine   14 mg Transdermal Daily   pantoprazole  (PROTONIX ) IV  40 mg Intravenous Q12H   rosuvastatin   40 mg Oral Daily   sodium chloride  flush  3 mL Intravenous Q12H   Continuous Infusions:  azithromycin  Stopped (10/04/24 2347)   cefTRIAXone  (ROCEPHIN )  IV 1 g (10/05/24 0648)   PRN Meds: acetaminophen  **OR** acetaminophen , fentaNYL  (SUBLIMAZE ) injection, ipratropium-albuterol , nicotine  polacrilex,  ondansetron  **OR** ondansetron  (ZOFRAN ) IV, oxyCODONE , sodium chloride  flush, sodium chloride  flush  Allergies:    Allergies  Allergen Reactions   Neurontin  [Gabapentin ] Nausea And Vomiting and Other (See Comments)    POSSIBLE SHAKING   Lyrica  [Pregabalin ] Other (See Comments)    Shaking     Social History:   Social History   Socioeconomic History   Marital status: Single    Spouse name: Not on file   Number of children: 1   Years of education: 12   Highest education level: 12th grade  Occupational History   Occupation: disabled  Tobacco Use   Smoking status: Every Day    Current packs/day: 1.00    Average packs/day: 1 pack/day for 44.0 years (44.0 ttl pk-yrs)    Types: E-cigarettes, Cigarettes   Smokeless tobacco: Former    Types: Snuff  Vaping Use   Vaping status: Former   Devices: 11/26/2018 stopped months ago  Substance and Sexual Activity   Alcohol use: Not Currently    Comment: occ   Drug use: Yes    Types: Crack cocaine, Marijuana, Oxycodone    Sexual activity: Not Currently  Other Topics Concern   Not on file  Social History Narrative   ** Merged History Encounter **       Lives in [place], in motel with sister.  They are looking to move but don't have a place to go yet.     Social Drivers of Corporate Investment Banker Strain: Low Risk  (02/09/2023)   Overall Financial Resource Strain (CARDIA)    Difficulty of Paying Living Expenses: Not hard at all  Food Insecurity: No Food Insecurity (10/05/2024)   Hunger Vital Sign    Worried About Running Out of Food in the Last Year: Never true    Ran Out of Food in the Last Year: Never true  Transportation Needs: Unmet Transportation Needs (10/05/2024)   PRAPARE - Transportation    Lack of Transportation (Medical): Yes    Lack of Transportation (Non-Medical): Yes  Physical Activity: Inactive (02/09/2023)   Exercise Vital Sign    Days of Exercise per Week: 0 days    Minutes of Exercise per Session: 0 min   Stress: No Stress Concern Present (02/09/2023)   [employer] of Occupational Health -  Occupational Stress Questionnaire    Feeling of Stress : Not at all  Social Connections: Unknown (08/21/2023)   Received from [facility]   Social Network    Social Network: Not on file  Intimate Partner Violence: Not At Risk (10/05/2024)   Humiliation, Afraid, Rape, and Kick questionnaire    Fear of Current or Ex-Partner: No    Emotionally Abused: No    Physically Abused: No    Sexually Abused: No    Family History:   Family History  Problem Relation Age of Onset   Hypertension Mother    Diabetes Mother    Cancer Mother        breast, ovarian, colon   Clotting disorder Mother    Heart disease Mother    Heart attack Mother    Breast cancer Mother        in 61's   Hypertension Father    Heart disease Father    Emphysema Sister        smoked     ROS:  Review of Systems  Constitutional: Positive for weight gain.  Cardiovascular:  Positive for chest pain (She describes as neck and bilateral shoulder pain) and leg swelling. Negative for claudication, irregular heartbeat, near-syncope, orthopnea, palpitations, paroxysmal nocturnal dyspnea and syncope.  Respiratory:  Positive for cough, shortness of breath and wheezing.   Hematologic/Lymphatic: Negative for bleeding problem.  Gastrointestinal:  Positive for dysphagia and melena.      Physical Exam/Data: Vitals:   10/05/24 0500 10/05/24 0805 10/05/24 0915 10/05/24 1150  BP: 136/75  131/64 (!) 145/69  Pulse: 75 83  90  Resp: 20 (!) 24 (!) 22 (!) 28  Temp: 98.3 F (36.8 C)  98.2 F (36.8 C) 98 F (36.7 C)  TempSrc: Oral  Oral Oral  SpO2: 97% 98% 95% 90%  Weight: 101.8 kg     Height: 5' 3 (1.6 m)       Intake/Output Summary (Last 24 hours) at 10/05/2024 1249 Last data filed at 10/05/2024 1200 Gross per 24 hour  Intake 1307.19 ml  Output 2550 ml  Net -1242.81 ml      10/05/2024    5:00 AM 09/30/2024   10:44 AM  09/23/2024   10:37 AM  Last 3 Weights  Weight (lbs) 224 lb 6.9 oz 202 lb 208 lb  Weight (kg) 101.8 kg 91.627 kg 94.348 kg     Body mass index is 39.76 kg/m.  General:  Chronically ill, obese woman in no acute distress HEENT: normal Neck: JVD with HJR Vascular:  Distal pulses 2+ bilaterally Cardiac:  normal S1, S2; RRR; no murmur  Lungs:  Bilaterally wheezing Abd: soft, nontender, distended Ext: trace-1+ pitting edema,overlying erythema to anterior shins, right complete metatarsal amputations.  Skin: warm and dry   EKG:  The EKG was personally reviewed and demonstrates:  See HPI Telemetry:  Telemetry was personally reviewed and demonstrates:  Sinus rhythm with PVCs HR 75  Relevant CV Studies: Echocardiogram 03/11/24 IMPRESSIONS     1. Left ventricular ejection fraction, by estimation, is 35 to 40%. The  left ventricle has moderately decreased function. The left ventricle  demonstrates global hypokinesis. The left ventricular internal cavity size  was upper limit of normal. Left  ventricular diastolic parameters are consistent with Grade I diastolic  dysfunction (impaired relaxation).   2. Right ventricular systolic function is mildly reduced. The right  ventricular size is normal. There is moderately elevated pulmonary artery  systolic pressure. The estimated right ventricular systolic pressure is  55.9 mmHg.   3. The mitral valve is degenerative. Moderate mitral valve regurgitation.  No evidence of mitral stenosis.   4. The aortic valve was not well visualized. Aortic valve regurgitation  is not visualized. No aortic stenosis is present.   5. The inferior vena cava is dilated in size with >50% respiratory  variability, suggesting right atrial pressure of 8 mmHg.    Laboratory Data: High Sensitivity Troponin:   Recent Labs  Lab 10/04/24 0951 10/04/24 1148 10/04/24 1503 10/04/24 1651 10/05/24 0724  TROPONINIHS 13 23* 56* 79* 377*     Chemistry Recent Labs  Lab  10/04/24 0425 10/04/24 1651 10/05/24 0023  NA 139 139 138  K 4.7 4.6 4.5  CL 102 100 98  CO2 24 24 23   GLUCOSE 315* 311* 299*  BUN 99* 92* 97*  CREATININE 2.45* 2.36* 2.41*  CALCIUM  8.1* 8.3* 8.1*  MG  --   --  2.5*  GFRNONAA 22* 23* 22*  ANIONGAP 13 15 17*    Recent Labs  Lab 10/03/24 1142 10/04/24 0425  PROT 6.2* 6.6  ALBUMIN  3.2* 3.4*  AST 17 24  ALT 18 20  ALKPHOS 94 90  BILITOT 0.5 0.6   Hematology Recent Labs  Lab 10/04/24 1651 10/05/24 0023 10/05/24 1032  WBC 11.1* 14.1* 17.8*  RBC 2.82* 2.75* 2.83*  HGB 8.3* 8.1* 8.3*  HCT 27.4* 26.9* 27.2*  MCV 97.2 97.8 96.1  MCH 29.4 29.5 29.3  MCHC 30.3 30.1 30.5  RDW 18.5* 18.8* 18.8*  PLT 197 221 213   Thyroid   Recent Labs  Lab 10/04/24 1148  TSH 1.580  FREET4 1.12    BNP Recent Labs  Lab 10/03/24 1142 10/04/24 0000  BNP 386.8* 405.1*    Radiology/Studies:  ECHOCARDIOGRAM COMPLETE Result Date: 10/04/2024    ECHOCARDIOGRAM REPORT   Patient Name:   [name] Date of Exam: 10/04/2024 Medical Rec #:  [id number]     Height:       63.0 in Accession #:    [id number]    Weight:       202.0 lb Date of Birth:  09/22/1961    BSA:          1.942 m Patient Age:    62 years      BP:           128/95 mmHg Patient Gender: F             HR:           100 bpm. Exam Location:  Inpatient Procedure: 2D Echo and Intracardiac Opacification Agent (Both Spectral and Color            Flow Doppler were utilized during procedure). Indications:    CHF  History:        Patient has prior history of Echocardiogram examinations. CHF,                 COPD; Risk Factors:Hypertension.  Sonographer:    Charmaine Gaskins Referring Phys: [id number] SUBRINA SUNDIL IMPRESSIONS  1. Global hypokinesis distal septal/ inferior basal akinesis . Left ventricular ejection fraction, by estimation, is 30 to 35%. The left ventricle has moderately decreased function. The left ventricle demonstrates global hypokinesis. The left ventricular internal cavity size  was moderately dilated. Left ventricular diastolic parameters are consistent with Grade I diastolic dysfunction (impaired relaxation).  2. Right ventricular systolic function is normal. The right ventricular size is normal.  3. Left atrial size was moderately dilated.  4. The  mitral valve is degenerative. Moderate to severe mitral valve regurgitation. No evidence of mitral stenosis.  5. Tricuspid valve regurgitation is moderate.  6. The aortic valve is tricuspid. There is mild calcification of the aortic valve. Aortic valve regurgitation is trivial. Aortic valve sclerosis is present, with no evidence of aortic valve stenosis.  7. The inferior vena cava is normal in size with greater than 50% respiratory variability, suggesting right atrial pressure of 3 mmHg. FINDINGS  Left Ventricle: Global hypokinesis distal septal/ inferior basal akinesis. Left ventricular ejection fraction, by estimation, is 30 to 35%. The left ventricle has moderately decreased function. The left ventricle demonstrates global hypokinesis. Definity  contrast agent was given IV to delineate the left ventricular endocardial borders. Strain was performed and the global longitudinal strain is indeterminate. The left ventricular internal cavity size was moderately dilated. There is no left ventricular hypertrophy. Left ventricular diastolic parameters are consistent with Grade I diastolic dysfunction (impaired relaxation). Right Ventricle: The right ventricular size is normal. No increase in right ventricular wall thickness. Right ventricular systolic function is normal. Left Atrium: Left atrial size was moderately dilated. Right Atrium: Right atrial size was normal in size. Pericardium: There is no evidence of pericardial effusion. Mitral Valve: The mitral valve is degenerative in appearance. There is mild thickening of the mitral valve leaflet(s). There is mild calcification of the mitral valve leaflet(s). Mild mitral annular calcification. Moderate  to severe mitral valve regurgitation. No evidence of mitral valve stenosis. MV peak gradient, 6.6 mmHg. The mean mitral valve gradient is 3.0 mmHg. Tricuspid Valve: The tricuspid valve is normal in structure. Tricuspid valve regurgitation is moderate . No evidence of tricuspid stenosis. Aortic Valve: The aortic valve is tricuspid. There is mild calcification of the aortic valve. Aortic valve regurgitation is trivial. Aortic valve sclerosis is present, with no evidence of aortic valve stenosis. Pulmonic Valve: The pulmonic valve was normal in structure. Pulmonic valve regurgitation is not visualized. No evidence of pulmonic stenosis. Aorta: The aortic root is normal in size and structure. Venous: The inferior vena cava is normal in size with greater than 50% respiratory variability, suggesting right atrial pressure of 3 mmHg. IAS/Shunts: No atrial level shunt detected by color flow Doppler. Additional Comments: 3D was performed not requiring image post processing on an independent workstation and was indeterminate.  LEFT VENTRICLE PLAX 2D LVIDd:         6.30 cm      Diastology LVIDs:         5.30 cm      LV e' medial:    4.90 cm/s LV PW:         0.90 cm      LV E/e' medial:  21.4 LV IVS:        1.00 cm      LV e' lateral:   10.00 cm/s LVOT diam:     2.10 cm      LV E/e' lateral: 10.5 LVOT Area:     3.46 cm  LV Volumes (MOD) LV vol d, MOD A2C: 207.0 ml LV vol d, MOD A4C: 216.0 ml LV vol s, MOD A2C: 145.0 ml LV vol s, MOD A4C: 171.0 ml LV SV MOD A2C:     62.0 ml LV SV MOD A4C:     216.0 ml LV SV MOD BP:      58.0 ml RIGHT VENTRICLE RV Basal diam:  3.50 cm RV Mid diam:    3.20 cm RV S prime:     11.00 cm/s  LEFT ATRIUM              Index        RIGHT ATRIUM           Index LA diam:        4.30 cm  2.21 cm/m   RA Area:     20.60 cm LA Vol (A2C):   79.8 ml  41.10 ml/m  RA Volume:   63.10 ml  32.49 ml/m LA Vol (A4C):   104.0 ml 53.56 ml/m LA Biplane Vol: 93.6 ml  48.20 ml/m   AORTA Ao Root diam: 3.00 cm Ao Asc diam:   3.50 cm MITRAL VALVE                TRICUSPID VALVE MV Area (PHT): 5.70 cm     TR Peak grad:   69.2 mmHg MV Peak grad:  6.6 mmHg     TR Vmax:        416.00 cm/s MV Mean grad:  3.0 mmHg MV Vmax:       1.28 m/s     SHUNTS MV Vmean:      77.1 cm/s    Systemic Diam: 2.10 cm MV Decel Time: 133 msec MV E velocity: 105.00 cm/s MV A velocity: 66.00 cm/s MV E/A ratio:  1.59 Maude Emmer MD Electronically signed by Maude Emmer MD Signature Date/Time: 10/04/2024/11:16:58 AM    Final    DG Chest Portable 1 View Result Date: 10/03/2024 CLINICAL DATA:  Shortness of breath with abdominal pain and bloody stool. EXAM: PORTABLE CHEST 1 VIEW COMPARISON:  April 24, 2024 FINDINGS: The cardiac silhouette is mildly enlarged and unchanged in size. There is mild prominence of the pulmonary vasculature. Mild atelectasis is suspected within the right lung base. No acute infiltrate, pleural effusion or pneumothorax is identified. The visualized skeletal structures are unremarkable. IMPRESSION: 1. Mild cardiomegaly and mild pulmonary vascular congestion. 2. Mild right basilar atelectasis. Electronically Signed   By: Suzen Dials M.D.   On: 10/03/2024 21:09   Assessment and Plan:  Atypical chest pain Elevated Troponin ECG and troponin trend as above Echocardiogram this admissions and prior shows RWMA. Patient is high risk for possible CAD given her vasculopathy, ongoing tobacco use, T2DM, hypertension, and obesity.   At this time hard to discern if troponin elevation is due to demand ischemia but obstructive disease cannot be entirely ruled out.   Patient would benefit from an ischemic evaluation, however we will need to investigate the melanotic stools that she has been reporting, her anemia, and also renal function.  Will reach out to primary and GI if we are able to anticoagulate/use antiplatelet if troponin trend continues to rise.  Will continue to trend troponins.  Patient is symptom-free BB on hold as  below Receiving bidil  as below  Hyperlipidemia 02/2024 LDL 79  HDL 37 Continue crestor  40 mg  Acute on Chronic HFrEF Patient reported last week her weight was 202, though weight at last AHF clinc was 218 with Cardiomems score 23. No weight on admission. Weight today 224.  Net IO Since Admission: -1,842.7 mL [10/05/24 1249] On exam appears volume up.   EF mildly reduced from previous echocardiogram, most likely due to acute presentation though has steadily been declining.  Would continue IV lasix  80 mg BID, continue to watch K and replete as needed Continue bidil  20-37.5 BID Will restart BB once more compensated SGLT2i deferred 2/2 severe recurrent UTI ARB/MAR deferred 2/2 renal dysfunction  Moderate to severe mitral  regurgitation Known moderate MR, echo this admission shows possible progression, though would repeat once euvolemic to assess severity. Patient unlikely to be a surgical candidate.   Severe PAD Antiplatelet deferred 2/2 chronic anticoagulation.   Paroxysmal Atrial fibrillation Currently in sinus rhythm  Chad Vasc score 5 Eliquis  on hold with GI bleed Continue amiodarone  100 mg  Hypertension BP: 131/64 Medications as above  Tobacco Use NRT per primary  Per primary GI bleed COPD exacerbation UTI Hypothyroidism Gout Cirrhosis CKD Depression   Risk Assessment/Risk Scores:    TIMI Risk Score for Unstable Angina or Non-ST Elevation MI:   The patient's TIMI risk score is 4, which indicates a 20% risk of all cause mortality, new or recurrent myocardial infarction or need for urgent revascularization in the next 14 days.  New [place]  Heart Association (NYHA) Functional Class NYHA Class IIIb though patient has significant lung disease and is deconditioned  CHA2DS2-VASc Score = 5   This indicates a 7.2% annual risk of stroke. The patient's score is based upon: CHF History: 1 HTN History: 1 Diabetes History: 1 Stroke History: 0 Vascular Disease History:  1 Age Score: 0 Gender Score: 1   For questions or updates, please contact [facility] Please consult [email or web address] for contact info under   Signed, Leontine LOISE Salen, PA-C  10/05/2024 12:49 PM  ADDENDUM:   Patient seen and examined with Leontine Salen PA-C.  I personally taken a history, examined the patient, reviewed relevant notes,  laboratory data / imaging studies.  I performed a substantive portion of this encounter and formulated the important aspects of the plan.  I agree with the APP's note, impression, and recommendations; however, I have edited the note to reflect changes or salient points.   A very pleasant 63 year old Caucasian female with a complex past medical history as outlined above presents to the hospital predominantly for abdominal pain and bloody stools. She states that she has had at least 1 melanotic stools 2 days prior to the arrival to the ED.  She has not had a bowel movement since Thursday to see if it has reoccurred.  2 months ago she had a similar event and she did undergo endoscopies at that time along with PRBC transfusions.  Unfortunately, she has been taking BC powders and NSAIDs for pain control in addition to be on anticoagulation for her A-fib.  No active chest pain. She did endorse precordial pain earlier this admission but when asked patient states that the discomfort is bilateral shoulder girdle and neck.  No precordial discomfort.  No pain with exertion.  No resolution of discomfort with resting and relaxing.   In fact, patient states that IV Protonix  and diuresis has significantly helped her precordial discomfort.  Cardiology consulted for elevated troponins in the setting of her clinical presentation and comorbid conditions.   PHYSICAL EXAM: Today's Vitals   10/05/24 0805 10/05/24 0915 10/05/24 1150 10/05/24 1329  BP:  131/64 (!) 145/69   Pulse: 83  90   Resp: (!) 24 (!) 22 (!) 28   Temp:  98.2 F (36.8 C) 98 F (36.7 C)    TempSrc:  Oral Oral   SpO2: 98% 95% 90% 93%  Weight:      Height:      PainSc:   0-No pain    Body mass index is 39.76 kg/m.   Net IO Since Admission: -1,842.7 mL [10/05/24 1515]  Filed Weights   10/05/24 0500  Weight: 101.8 kg    General: Appears older than  stated age, hemodynamically stable, audible wheezing, on nasal cannula oxygen  HEENT: Normocephalic, atraumatic, trachea midline, dry mucous membranes, JVP Lungs: Diffuse bilateral wheezing on physical examination. Heart: Regular, positive S1-S2, no murmurs rubs or gallops appreciated secondary to tachycardia Abdomen: Obese, soft, nontender, nondistended, positive bowel sounds in all 4 quadrants Extremities: Warm to touch bilaterally, +1 pitting edema, right TMA Neuro: No focal neurological deficits Psych: Appropriate and cooperative  EKG: (personally reviewed by me) 10/05/2024: Sinus rhythm, IVCD, T wave abnormality, cannot rule out lateral ischemia.  Telemetry: (personally reviewed by me) Sinus rhythm   Impression & Recommendations: :  Precordial pain Elevated troponins Precordial pain is entirely noncardiac EKG does not show ACS pattern High sensitive troponins peaked at 377 Not started on IV heparin  per ACS protocol as she has mentioned recent melanotic stools on POA However, it is very reasonable to initiate IV heparin  if she continues to have uptrending troponins or she has anginal discomfort. Her current troponin trend is likely secondary to demand ischemia in the setting of underlying cardiomyopathy, HFrEF acute exacerbation, underlying anemia, CKD. Echocardiogram results reviewed Ideally would recommend left heart catheterization to evaluate for obstructive disease.  However given the fact that she is complaining of having melanotic stools and her underlying CKD stage IV she is at high risk for contrast-induced nephropathy and progression of CKD leading to dialysis.  For now patient would like to hold off on  ischemic workup which is reasonable unless of change in clinical status.   Cardiomyopathy, regional wall motion abnormality Acute heart failure with reduced EF Stage C, NYHA class III Likely secondary to medication noncompliance, cigarette smoking, URI, anemia Continue Lasix  80 mg IV push twice daily. Bilateral compression stockings to help mobilize fluid. Continue BiDil  20/37.5 mg p.o. 3 times daily. Uptitrate GDMT based on renal function Check CardioMEMS readings on Monday, if possible to help facilitate diuresis.  Melanotic stools, per patient Anemia Has not had a repeat bowel movement since Thursday. Hemoccult ordered. No significant drop in hemoglobin has baseline anemia Monitor H&H  Chronic kidney disease stage IV: Avoid nephrotoxic agents, was using NSAIDs and BC powders prior to admission which is likely contributory.  Monitor BUN and creatinine  Persistent atrial fibrillation/antiarrhythmic (high risk medication)/long-term oral anticoagulation: Currently sinus rhythm, anticoagulation held due to concerns for possible GI bleed.  Shared decision between the patient, attending physician, and GI recommendations and myself is to consider IV heparin  without bolus tomorrow based on her hemoglobin trends and further workup.  Cigarette smoking: Reemphasized importance of complete cessation.  Medical Decision:  Complexity: high Interdisciplinary: yes - spoke with attending physician and he was kind to reach out to GI provider on call as well  Independently reviewed: EKG, labs, telemetry, echo, prior AHF progress note.  Prescription drug management -recommendations stated above  Further recommendations to follow as the case evolves.   This note was created using a voice recognition software as a result there may be grammatical errors inadvertently enclosed that do not reflect the nature of this encounter. Every attempt is made to correct such errors.   Madonna Michele HAS, [facility]  A Division of [facility] [address] [place], [place] [address]  Pager: [phone] Office: [phone] 10/05/2024 3:15 PM

## 2024-10-05 NOTE — Plan of Care (Signed)

## 2024-10-05 NOTE — Progress Notes (Signed)
 [facility] Progress Note Patient: [name] FMW:[id number] DOB: 12-28-1960  DOA: 10/03/2024 DOS: the patient was seen and examined on 10/05/2024  Brief Hospital Course: Patient with PMH of paroxysmal A-fib on Eliquis , CKD stage IV, chronic HFrEF, EF 35%, CAD, HLD, PAD, active smoker, hypothyroidism, COPD, gout, recurrent GI bleed presented to the hospital with complaints of cough and shortness of breath. Currently been treated for acute on chronic HFrEF as well as GI bleed.  Assessment and Plan: Upper GI bleed with melena. Presented with epigastric pain and 2 episodes of black tarry stool. Similar presentation in May 2025 at which time EGD and colonoscopy did not show any evidence of active bleeding. [employer] [facility] was consulted. Currently no plans for endoscopic evaluation given her tenuous cardiopulmonary status and stable hemoglobin. Recommend capsule endoscopy if she bleeds or has a drop in hemoglobin. Transfuse for hemoglobin less than 8. Currently continuing PPI twice daily.  IV.  Acute on chronic HFrEF. Essential hypertension EF 35 to 40% Severe mitral valve regurgitation. Presents with complaints of shortness of breath as well as lower extremity swelling. Chest x-ray showed vascular congestion. Currently receiving IV Lasix  80 mg twice daily. Home regimen torsemide  60 mg twice daily. Sees advanced heart failure service outpatient. Continue BiDil  and hold Toprol -XL Echocardiogram EF 30 to 35%.  Global hypokinesis.  Severe mitral regurgitation. Cardiology consulted.  Will follow recommendation.  Elevated troponin. History of CAD. Atypical left-sided chest pain. Likely demand ischemia in the setting of heart failure and COPD exacerbation. Troponins are trending up from 13-56 but not consistent with ACS territory. EKG negative for any acute ischemia as well. Patient does have intermittent chest pain on the left side, known producible.  But burning in nature. Has  chronic LBBB. Echocardiogram also performed which also does not show any evidence of new wall motion pulmonary. Cardiology consult appreciated. Troponins are trending down after peaking at 377. Not typical for ACS territory. No anticoagulation given presentation with GI bleed. For now monitor clinically.  And treat underlying process.   Acute COPD exacerbation Patient has bilateral expiratory wheezes. Concern for COPD exacerbation although COVID and RVP both negative. Procalcitonin also negative. Started on steroids, will taper off. Continue DuoNebs.  ABG negative for any significant respiratory acidosis or hypercarbia..  Continue BiPAP as needed. Given how sick she is, will provide a short course of antibiotics treatment.   Acute cystitis Nitrite positive UA with pyuria and leukocytosis. Currently on IV ceftriaxone .  Treat for 5 days total. Follow-up on urine cultures.   Paroxysmal atrial fibrillation Rate currently controlled. Holding Toprol -XL. Holding Eliquis  in the setting of GI bleed. Given her hemoglobin is stable, recommended to resume anticoagulation with heparin  without a bolus on 11/23 if remains stable. GI okay with anticoagulation plan   Hyperlipidemia Continue Crestor    Hypothyroidism Continue Synthroid .   History of CAD and PAD Continue Crestor  and Toprol -XL. Holding antiplatelet therapy in the setting of GI bleed.   History of gout Holding allopurinol  in the setting of advanced CKD.   History of chronic tobacco smoking Still smokes 1 pack a day.  Continue nicotine  patch.  Counseled patient for smoking cessation   CKD stage IV Baseline serum creatinine appears to be around 2-2.7. On presentation serum creatinine 2.46. Monitor while receiving diuresis.Generalized anxiety disorder  Depression. Acute public encephalopathy. Metabolic workup appears to be unremarkable. Holding Wellbutrin  XL   Subjective: No vomiting but has some nausea.  Ongoing  shortness of breath.  No chest pain at the time of  my evaluation but earlier in the day had some chest pain rating to her left side feels like a burning pain. No nausea no vomiting. No fever no chills.  Physical Exam: General: in moderate distress, No Rash Cardiovascular: S1 and S2 Present, No Murmur Respiratory: Good respiratory effort, Bilateral Air entry present.  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 Neuro: Drowsy and oriented x3, no new focal deficit, no asterixis  Data Reviewed: I have Reviewed nursing notes, Vitals, and Lab results. Since last encounter, pertinent lab results CBC and BMP   . I have ordered test including CBC and BMP  . I have discussed pt's care plan and test results with GI and cardiology  .   Disposition: Status is: Inpatient Remains inpatient appropriate because: Monitor for improvement status  SCDs Start: 10/03/24 2223 Place TED hose Start: 10/03/24 2223   Family Communication: No one at bedside Level of care: Progressive   Vitals:   10/05/24 0805 10/05/24 0915 10/05/24 1150 10/05/24 1329  BP:  131/64 (!) 145/69   Pulse: 83  90   Resp: (!) 24 (!) 22 (!) 28   Temp:  98.2 F (36.8 C) 98 F (36.7 C)   TempSrc:  Oral Oral   SpO2: 98% 95% 90% 93%  Weight:      Height:         Author: Yetta Blanch, MD 10/05/2024 4:35 PM  Please look on [email or web address] to find out who is on call.

## 2024-10-06 ENCOUNTER — Inpatient Hospital Stay (HOSPITAL_COMMUNITY)

## 2024-10-06 DIAGNOSIS — I429 Cardiomyopathy, unspecified: Secondary | ICD-10-CM | POA: Diagnosis not present

## 2024-10-06 DIAGNOSIS — K922 Gastrointestinal hemorrhage, unspecified: Secondary | ICD-10-CM | POA: Diagnosis not present

## 2024-10-06 DIAGNOSIS — R7989 Other specified abnormal findings of blood chemistry: Secondary | ICD-10-CM | POA: Diagnosis not present

## 2024-10-06 DIAGNOSIS — R072 Precordial pain: Secondary | ICD-10-CM | POA: Diagnosis not present

## 2024-10-06 DIAGNOSIS — I5021 Acute systolic (congestive) heart failure: Secondary | ICD-10-CM | POA: Diagnosis not present

## 2024-10-06 LAB — CBC WITH DIFFERENTIAL/PLATELET
Abs Immature Granulocytes: 0.1 K/uL — ABNORMAL HIGH (ref 0.00–0.07)
Basophils Absolute: 0 K/uL (ref 0.0–0.1)
Basophils Relative: 0 %
Eosinophils Absolute: 0 K/uL (ref 0.0–0.5)
Eosinophils Relative: 0 %
HCT: 26.2 % — ABNORMAL LOW (ref 36.0–46.0)
Hemoglobin: 7.9 g/dL — ABNORMAL LOW (ref 12.0–15.0)
Immature Granulocytes: 1 %
Lymphocytes Relative: 3 %
Lymphs Abs: 0.4 K/uL — ABNORMAL LOW (ref 0.7–4.0)
MCH: 29.2 pg (ref 26.0–34.0)
MCHC: 30.2 g/dL (ref 30.0–36.0)
MCV: 96.7 fL (ref 80.0–100.0)
Monocytes Absolute: 0.6 K/uL (ref 0.1–1.0)
Monocytes Relative: 4 %
Neutro Abs: 13.8 K/uL — ABNORMAL HIGH (ref 1.7–7.7)
Neutrophils Relative %: 92 %
Platelets: 182 K/uL (ref 150–400)
RBC: 2.71 MIL/uL — ABNORMAL LOW (ref 3.87–5.11)
RDW: 18.9 % — ABNORMAL HIGH (ref 11.5–15.5)
WBC: 15 K/uL — ABNORMAL HIGH (ref 4.0–10.5)
nRBC: 0 % (ref 0.0–0.2)

## 2024-10-06 LAB — GLUCOSE, CAPILLARY
Glucose-Capillary: 228 mg/dL — ABNORMAL HIGH (ref 70–99)
Glucose-Capillary: 265 mg/dL — ABNORMAL HIGH (ref 70–99)
Glucose-Capillary: 267 mg/dL — ABNORMAL HIGH (ref 70–99)
Glucose-Capillary: 280 mg/dL — ABNORMAL HIGH (ref 70–99)
Glucose-Capillary: 309 mg/dL — ABNORMAL HIGH (ref 70–99)
Glucose-Capillary: 313 mg/dL — ABNORMAL HIGH (ref 70–99)
Glucose-Capillary: 372 mg/dL — ABNORMAL HIGH (ref 70–99)

## 2024-10-06 LAB — BASIC METABOLIC PANEL WITH GFR
Anion gap: 12 (ref 5–15)
BUN: 85 mg/dL — ABNORMAL HIGH (ref 8–23)
CO2: 27 mmol/L (ref 22–32)
Calcium: 8 mg/dL — ABNORMAL LOW (ref 8.9–10.3)
Chloride: 97 mmol/L — ABNORMAL LOW (ref 98–111)
Creatinine, Ser: 2.02 mg/dL — ABNORMAL HIGH (ref 0.44–1.00)
GFR, Estimated: 28 mL/min — ABNORMAL LOW (ref >60)
Glucose, Bld: 313 mg/dL — ABNORMAL HIGH (ref 70–99)
Potassium: 4 mmol/L (ref 3.5–5.1)
Sodium: 136 mmol/L (ref 135–145)

## 2024-10-06 LAB — MAGNESIUM: Magnesium: 2.4 mg/dL (ref 1.7–2.4)

## 2024-10-06 LAB — TROPONIN I (HIGH SENSITIVITY)
Troponin I (High Sensitivity): 361 ng/L (ref <18)
Troponin I (High Sensitivity): 470 ng/L (ref <18)

## 2024-10-06 LAB — APTT: aPTT: 34 s (ref 24–36)

## 2024-10-06 LAB — HEPARIN LEVEL (UNFRACTIONATED): Heparin Unfractionated: 0.76 [IU]/mL — ABNORMAL HIGH (ref 0.30–0.70)

## 2024-10-06 MED ORDER — METOPROLOL TARTRATE 5 MG/5ML IV SOLN
10.0000 mg | Freq: Once | INTRAVENOUS | Status: AC
  Administered 2024-10-06: 10 mg via INTRAVENOUS

## 2024-10-06 MED ORDER — CALCIUM CARBONATE ANTACID 500 MG PO CHEW
2.0000 | CHEWABLE_TABLET | Freq: Two times a day (BID) | ORAL | Status: DC | PRN
  Administered 2024-10-06: 400 mg via ORAL
  Filled 2024-10-06: qty 2

## 2024-10-06 MED ORDER — AMIODARONE LOAD VIA INFUSION
150.0000 mg | Freq: Once | INTRAVENOUS | Status: AC
  Filled 2024-10-06: qty 83.34

## 2024-10-06 MED ORDER — NITROGLYCERIN 0.4 MG SL SUBL
SUBLINGUAL_TABLET | SUBLINGUAL | Status: AC
Start: 2024-10-06 — End: 2024-10-06
  Filled 2024-10-06: qty 1

## 2024-10-06 MED ORDER — METOPROLOL TARTRATE 12.5 MG HALF TABLET
12.5000 mg | ORAL_TABLET | Freq: Two times a day (BID) | ORAL | Status: DC
  Administered 2024-10-06 – 2024-10-09 (×6): 12.5 mg via ORAL
  Filled 2024-10-06 (×6): qty 1

## 2024-10-06 MED ORDER — METOPROLOL TARTRATE 5 MG/5ML IV SOLN: 5.0000 mg | Freq: Once | INTRAVENOUS | Status: AC

## 2024-10-06 MED ORDER — INSULIN ASPART 100 UNIT/ML IJ SOLN
4.0000 [IU] | INTRAMUSCULAR | Status: AC
  Administered 2024-10-06: 4 [IU] via SUBCUTANEOUS

## 2024-10-06 MED ORDER — INSULIN ASPART 100 UNIT/ML IJ SOLN: 2.0000 [IU] | Freq: Once | INTRAMUSCULAR | Status: DC

## 2024-10-06 MED ORDER — METOPROLOL TARTRATE 5 MG/5ML IV SOLN
INTRAVENOUS | Status: AC
  Administered 2024-10-06: 5 mg via INTRAVENOUS
  Filled 2024-10-06: qty 5

## 2024-10-06 MED ORDER — METOPROLOL TARTRATE 5 MG/5ML IV SOLN
INTRAVENOUS | Status: AC
  Filled 2024-10-06: qty 5

## 2024-10-06 MED ORDER — NITROGLYCERIN 0.4 MG SL SUBL
0.4000 mg | SUBLINGUAL_TABLET | SUBLINGUAL | Status: DC | PRN
  Administered 2024-10-06 – 2024-10-09 (×3): 0.4 mg via SUBLINGUAL
  Filled 2024-10-06 (×2): qty 1

## 2024-10-06 MED ORDER — AMIODARONE HCL IN DEXTROSE 360-4.14 MG/200ML-% IV SOLN
INTRAVENOUS | Status: AC
  Administered 2024-10-06: 150 mg via INTRAVENOUS
  Filled 2024-10-06: qty 200

## 2024-10-06 MED ORDER — HEPARIN (PORCINE) 25000 UT/250ML-% IV SOLN
1550.0000 [IU]/h | INTRAVENOUS | Status: DC
  Administered 2024-10-06: 900 [IU]/h via INTRAVENOUS
  Administered 2024-10-07: 1250 [IU]/h via INTRAVENOUS
  Administered 2024-10-08: 1350 [IU]/h via INTRAVENOUS
  Administered 2024-10-09: 1450 [IU]/h via INTRAVENOUS
  Filled 2024-10-06 (×4): qty 250

## 2024-10-06 MED ORDER — LACTULOSE 10 GM/15ML PO SOLN
30.0000 g | Freq: Two times a day (BID) | ORAL | Status: DC
  Administered 2024-10-06 – 2024-10-12 (×13): 30 g via ORAL
  Filled 2024-10-06 (×13): qty 45

## 2024-10-06 MED ORDER — METOPROLOL TARTRATE 5 MG/5ML IV SOLN: 10.0000 mg | Freq: Once | INTRAVENOUS | Status: DC

## 2024-10-06 MED ORDER — AMIODARONE HCL IN DEXTROSE 360-4.14 MG/200ML-% IV SOLN
60.0000 mg/h | INTRAVENOUS | Status: AC
  Administered 2024-10-06: 60 mg/h via INTRAVENOUS

## 2024-10-06 MED ORDER — INSULIN ASPART 100 UNIT/ML IJ SOLN
5.0000 [IU] | Freq: Three times a day (TID) | INTRAMUSCULAR | Status: DC
  Administered 2024-10-06 – 2024-10-12 (×18): 5 [IU] via SUBCUTANEOUS
  Filled 2024-10-06: qty 5
  Filled 2024-10-06 (×13): qty 1
  Filled 2024-10-06 (×2): qty 5
  Filled 2024-10-06 (×3): qty 1

## 2024-10-06 MED ORDER — AMIODARONE HCL IN DEXTROSE 360-4.14 MG/200ML-% IV SOLN: 30.0000 mg/h | INTRAVENOUS | Status: DC

## 2024-10-06 MED ORDER — GUAIFENESIN-DM 100-10 MG/5ML PO SYRP
5.0000 mL | ORAL_SOLUTION | ORAL | Status: DC | PRN
  Administered 2024-10-07: 5 mL via ORAL
  Filled 2024-10-06 (×2): qty 5

## 2024-10-06 NOTE — Progress Notes (Signed)
 PHARMACY - ANTICOAGULATION CONSULT NOTE  Pharmacy Consult for IV heparin  Indication: atrial fibrillation  Allergies  Allergen Reactions   Neurontin  [Gabapentin ] Nausea And Vomiting and Other (See Comments)    POSSIBLE SHAKING   Lyrica  [Pregabalin ] Other (See Comments)    Shaking     Patient Measurements: Height: 5' 3 (160 cm) Weight: 101.8 kg (224 lb 6.9 oz) IBW/kg (Calculated) : 52.4 HEPARIN  DW (KG): 76.4  Vital Signs: Temp: 97.9 F (36.6 C) (11/23 0808) Temp Source: Oral (11/23 0808) BP: 134/67 (11/23 0808) Pulse Rate: 98 (11/23 0808)  Labs: Recent Labs    10/04/24 1148 10/04/24 1503 10/04/24 1651 10/05/24 0023 10/05/24 0724 10/05/24 1032 10/05/24 1327 10/05/24 1503 10/06/24 0117  HGB 9.0*  --  8.3* 8.1*  --  8.3*  --   --  7.9*  HCT 30.2*  --  27.4* 26.9*  --  27.2*  --   --  26.2*  PLT 231  --  197 221  --  213  --   --  182  LABPROT 15.5*  --   --   --   --   --   --   --   --   INR 1.2  --   --   --   --   --   --   --   --   CREATININE  --   --  2.36* 2.41*  --   --   --   --  2.02*  TROPONINIHS 23*   < > 79*  --  377*  --  335* 327*  --    < > = values in this interval not displayed.    Estimated Creatinine Clearance: 33.3 mL/min (A) (by C-G formula based on SCr of 2.02 mg/dL (H)).   Medical History: Past Medical History:  Diagnosis Date   Acute renal failure    Acute respiratory failure with hypoxia ([name]) 12/02/2019   Alcohol abuse    Alcoholic cirrhosis ([name])    Anemia    Anxiety    Arthritis    knees (11/26/2018)   B12 deficiency    CAD (coronary artery disease)    a. 11/10/2014 Cath: LM nl, LAD min irregs, D1 30 ost, D2 50d, LCX 23m, OM1 80 p/m (1.5 mm vessel), OM2 53m, RCA nondom 28m-->med rx.. Demand ischemia in the setting of rapid a-fib.   Cardiomyopathy ([name])    Carotid artery disease    a. 01/2015 Carotid Angio: RICA 100, LICA 95p; b. 02/2015 s/p L CEA; c. 05/2019 Carotid U/S: RICA 100. RECA >50. LICA 1-39%.   Cellulitis    lower  extremities   Cellulitis in diabetic foot ([name]) 07/08/2019   CHF (congestive heart failure) ([name])    Chronic combined systolic and diastolic CHF (congestive heart failure) ([name])    a. 10/2014 Echo: EF 40-45%; b. 10/2018 Echo: EF 45-50%, gr2 DD; c. 11/2019 Echo: EF 50%, mild LVH, gr2 DD (restrictive), antlat HK, Nl RV fxn. Mild BAE. RVSP 59.21mmHg.   CKD (chronic kidney disease), stage III ([name])    Cocaine abuse ([name])    COPD (chronic obstructive pulmonary disease) ([name])    Critical ischemia of foot ([name]) 06/07/2021   Critical limb ischemia     Critical lower limb ischemia ([name])    Demand ischemia ([name]) 10/29/2014   Depression    Diabetes mellitus without complication ([name])    Diabetic peripheral neuropathy ([name])    DVT (deep venous thrombosis) ([name])    Dyspnea  Elevated troponin    a. Chronic elevation.   Fall 05/05/2021   Femoro-popliteal artery disease    Peripheral arterial disease/critical limb ischemia     GERD (gastroesophageal reflux disease)    Hyperlipemia    Hypertension    Hypokalemia    Hypomagnesemia    Hypothyroidism    Marijuana abuse    Narcotic abuse ([name])    Noncompliance    NSVT (nonsustained ventricular tachycardia) ([name])    Obesity    Osteomyelitis ([name]) 06/21/2019   PAF (paroxysmal atrial fibrillation) ([name])    Paroxysmal atrial tachycardia    Peripheral arterial disease    a. 01/2015 Angio/PTA: RSFA 99 (atherectomy/pta) - 1 vessel runoff via diff dzs peroneal; b. 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   Pneumonia    once or twice (11/26/2018)   Poorly controlled type 2 diabetes mellitus ([name])    Renal disorder    Renal insufficiency    a. 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   SIRS (systemic inflammatory response syndrome) ([name]) 04/06/2017   Sleep apnea    couldn't handle wearing the mask (11/26/2018)   Symptomatic bradycardia    a. Avoid AV blocking agent per EP. Prev req temp wire in 2017.   Tobacco abuse    Wrist fracture, left,  closed, initial encounter 01/29/2015    Medications:  Infusions:   heparin       Assessment: 63 yo female on chronic Eliquis  for afib, currently on hold for afib.  Pharmacy asked to start IV heparin  today with no bolus.  Hgb fairly stable at 7.9, platelet count 182.  Goal of Therapy:  Heparin  level 0.3-0.5 Monitor platelets by anticoagulation protocol: Yes   Plan:  Start IV heparin  at 900 units/hr with no bolus. Check heparin  level, APTT in 8 hrs Daily aPTT, heparin  level and CBC.  Harlene Barlow, [employer], BCCP Clinical Pharmacist  10/06/2024 11:19 AM   [facility] pharmacy phone numbers are listed on [email or web address]

## 2024-10-06 NOTE — Progress Notes (Addendum)
 PROGRESS NOTE    [name]  FMW:[id number] DOB: Mar 24, 1961 DOA: 10/03/2024 PCP: Campbell Reynolds, NP  Chronically ill 62/F with heavy tobacco use, COPD, paroxysmal A-fib, chronic systolic CHF, CAD, PAD, CKD 4, gout, hypothyroidism, recurrent GI bleed presented to the ED with volume overload as well as anemia, melena -GI and cardiology following, no plan for repeat endoscopic eval at this time - Ischemic workup limited, existing CKD  Subjective: -Breathing slowly improving  Assessment and Plan:  Upper GI bleed with melena. Presented with epigastric pain and 2 episodes of black tarry stool. Similar presentation in May 2025 at which time EGD and colonoscopy did not show any evidence of active bleeding. [facility] was consulted. Currently no plans for endoscopic evaluation given her tenuous cardiopulmonary status and stable hemoglobin. Recommend capsule endoscopy if she rebleeds or has a drop in hemoglobin. Transfuse for hemoglobin less than 8. Change PPI to p.o. today -Eliquis  on hold   Acute on chronic HFrEF. Severe mitral valve regurgitation. -Last echo with EF 30-35%, grade 1 DD, normal RV, moderate to severe mitral regurgitation -Improving with diuresis, remains volume overloaded  - Continue Lasix  80 mg twice daily, BiDil , Toprol   - GDMT limited by CKD 4 - Cards following   Elevated troponin. History of CAD. Atypical left-sided chest pain. -Troponin peaked to 377, now improving, felt to be demand ischemia, has chronic LBBB -Cards following, no plan for ischemic workup with CKD 4 -Continue statin, Eliquis    COPD - Clinically do not suspect COPD exacerbation at this time, symptoms primarily from volume overload -DC antibiotics and steroids  Abnormal urinalysis -Urine cultures are negative, DC ABX    Paroxysmal atrial fibrillation Currently in sinus rhythm Holding Toprol -XL. Holding Eliquis  in the setting of GI bleed. Given her hemoglobin is stable, recommended to  resume anticoagulation with heparin  without a bolus on 11/23 if remains stable.  Type 2 diabetes mellitus -CBGs uncontrolled, continue glargine, and add meal coverage   Hyperlipidemia Continue Crestor    Hypothyroidism Continue Synthroid .   History of CAD and PAD Continue Crestor  and Toprol -XL. Holding antiplatelet therapy in the setting of GI bleed.   History of gout Holding allopurinol  in the setting of advanced CKD.   History of chronic tobacco smoking Still smokes 1 pack a day.  Continue nicotine  patch.  Counseled patient for smoking cessation   CKD stage IV Baseline serum creatinine appears to be around 2-2.7. On presentation serum creatinine 2.46. Monitor while receiving diuresis.Generalized anxiety disorder   Depression. Acute public encephalopathy. Metabolic workup appears to be unremarkable. Holding Wellbutrin  XL     DVT prophylaxis: IV heparin  Code Status: DNR, d/w pt today Family Communication: none present Disposition Plan: SNF  Consultants:    Procedures:   Antimicrobials:    Objective: Vitals:   10/05/24 1956 10/06/24 0032 10/06/24 0147 10/06/24 0409  BP: (!) 140/60 (!) 144/73  (!) 145/75  Pulse: (!) 104 95 94 98  Resp: 20 20 18  (!) 22  Temp: 98.9 F (37.2 C) 98.2 F (36.8 C)  98.2 F (36.8 C)  TempSrc: Oral Oral  Oral  SpO2: 98% 98% 100% 95%  Weight:    101.8 kg  Height:    5' 3 (1.6 m)    Intake/Output Summary (Last 24 hours) at 10/06/2024 0751 Last data filed at 10/06/2024 0411 Gross per 24 hour  Intake 1440 ml  Output 2100 ml  Net -660 ml   Filed Weights   10/05/24 0500 10/06/24 0409  Weight: 101.8 kg 101.8 kg  Examination:  General exam: Appears calm and comfortable  HEENT: + JVD Respiratory system: poor air movt, decreased at bases Cardiovascular system: S1 & S2 heard, RRR.  Abd: nondistended, soft and nontender.Normal bowel sounds heard. Central nervous system: Alert and oriented. No focal neurological  deficits. Extremities: 2plus edema Skin: No rashes Psychiatry:  Mood & affect appropriate.     Data Reviewed:   CBC: Recent Labs  Lab 10/04/24 1148 10/04/24 1651 10/05/24 0023 10/05/24 1032 10/06/24 0117  WBC 11.2* 11.1* 14.1* 17.8* 15.0*  NEUTROABS  --   --   --   --  13.8*  HGB 9.0* 8.3* 8.1* 8.3* 7.9*  HCT 30.2* 27.4* 26.9* 27.2* 26.2*  MCV 97.4 97.2 97.8 96.1 96.7  PLT 231 197 221 213 182   Basic Metabolic Panel: Recent Labs  Lab 10/03/24 1142 10/04/24 0425 10/04/24 1651 10/05/24 0023 10/06/24 0117  NA 142 139 139 138 136  K 5.0 4.7 4.6 4.5 4.0  CL 103 102 100 98 97*  CO2 22 24 24 23 27   GLUCOSE 210* 315* 311* 299* 313*  BUN 98* 99* 92* 97* 85*  CREATININE 2.46* 2.45* 2.36* 2.41* 2.02*  CALCIUM  8.4* 8.1* 8.3* 8.1* 8.0*  MG  --   --   --  2.5* 2.4   GFR: Estimated Creatinine Clearance: 33.3 mL/min (A) (by C-G formula based on SCr of 2.02 mg/dL (H)). Liver Function Tests: Recent Labs  Lab 10/03/24 1142 10/04/24 0425  AST 17 24  ALT 18 20  ALKPHOS 94 90  BILITOT 0.5 0.6  PROT 6.2* 6.6  ALBUMIN  3.2* 3.4*   Recent Labs  Lab 10/03/24 1142  LIPASE 34   Recent Labs  Lab 10/04/24 1148  AMMONIA 17   Coagulation Profile: Recent Labs  Lab 10/04/24 1148  INR 1.2   Cardiac Enzymes: No results for input(s): CKTOTAL, CKMB, CKMBINDEX, TROPONINI in the last 168 hours. BNP (last 3 results) No results for input(s): PROBNP in the last 8760 hours. HbA1C: No results for input(s): HGBA1C in the last 72 hours. CBG: Recent Labs  Lab 10/05/24 2131 10/06/24 0034 10/06/24 0205 10/06/24 0449 10/06/24 0628  GLUCAP 494* 372* 313* 265* 228*   Lipid Profile: No results for input(s): CHOL, HDL, LDLCALC, TRIG, CHOLHDL, LDLDIRECT in the last 72 hours. Thyroid  Function Tests: Recent Labs    10/04/24 1148  TSH 1.580  FREET4 1.12   Anemia Panel: Recent Labs    10/04/24 1148  VITAMINB12 402  FOLATE 14.6  TIBC 311  IRON  49   RETICCTPCT 4.2*   Urine analysis:    Component Value Date/Time   COLORURINE YELLOW 10/03/2024 2033   APPEARANCEUR HAZY (A) 10/03/2024 2033   LABSPEC 1.011 10/03/2024 2033   PHURINE 5.0 10/03/2024 2033   GLUCOSEU NEGATIVE 10/03/2024 2033   HGBUR NEGATIVE 10/03/2024 2033   BILIRUBINUR NEGATIVE 10/03/2024 2033   KETONESUR NEGATIVE 10/03/2024 2033   PROTEINUR NEGATIVE 10/03/2024 2033   UROBILINOGEN 0.2 09/05/2015 0012   NITRITE POSITIVE (A) 10/03/2024 2033   LEUKOCYTESUR LARGE (A) 10/03/2024 2033   Sepsis Labs: @LABRCNTIP (procalcitonin:4,lacticidven:4)  ) Recent Results (from the past 240 hours)  Respiratory (~20 pathogens) panel by PCR     Status: None   Collection Time: 10/04/24 12:00 AM   Specimen: Nasopharyngeal Swab; Respiratory  Result Value Ref Range Status   Adenovirus NOT DETECTED NOT DETECTED Final   Coronavirus 229E NOT DETECTED NOT DETECTED Final    Comment: (NOTE) The Coronavirus on the Respiratory Panel, DOES NOT test for the novel  Coronavirus (2019 nCoV)    Coronavirus HKU1 NOT DETECTED NOT DETECTED Final   Coronavirus NL63 NOT DETECTED NOT DETECTED Final   Coronavirus OC43 NOT DETECTED NOT DETECTED Final   Metapneumovirus NOT DETECTED NOT DETECTED Final   Rhinovirus / Enterovirus NOT DETECTED NOT DETECTED Final   Influenza A NOT DETECTED NOT DETECTED Final   Influenza B NOT DETECTED NOT DETECTED Final   Parainfluenza Virus 1 NOT DETECTED NOT DETECTED Final   Parainfluenza Virus 2 NOT DETECTED NOT DETECTED Final   Parainfluenza Virus 3 NOT DETECTED NOT DETECTED Final   Parainfluenza Virus 4 NOT DETECTED NOT DETECTED Final   Respiratory Syncytial Virus NOT DETECTED NOT DETECTED Final   Bordetella pertussis NOT DETECTED NOT DETECTED Final   Bordetella Parapertussis NOT DETECTED NOT DETECTED Final   Chlamydophila pneumoniae NOT DETECTED NOT DETECTED Final   Mycoplasma pneumoniae NOT DETECTED NOT DETECTED Final    Comment: Performed at [facility]  Lab, [address] [place], [place] [address]  Urine Culture (for pregnant, neutropenic or urologic patients or patients with an indwelling urinary catheter)     Status: Abnormal   Collection Time: 10/04/24  6:40 AM   Specimen: Urine, Clean Catch  Result Value Ref Range Status   Specimen Description URINE, CLEAN CATCH  Final   Special Requests Normal  Final   Culture (A)  Final    <10,000 COLONIES/mL INSIGNIFICANT GROWTH Performed at [facility], [address], [place], [place] [address]    Report Status 10/05/2024 FINAL  Final  Resp panel by RT-PCR (RSV, Flu A&B, Covid) Anterior Nasal Swab     Status: None   Collection Time: 10/04/24 10:55 AM   Specimen: Anterior Nasal Swab  Result Value Ref Range Status   SARS Coronavirus 2 by RT PCR NEGATIVE NEGATIVE Final   Influenza A by PCR NEGATIVE NEGATIVE Final   Influenza B by PCR NEGATIVE NEGATIVE Final    Comment: (NOTE) The Xpert Xpress SARS-CoV-2/FLU/RSV plus assay is intended as an aid in the diagnosis of influenza from Nasopharyngeal swab specimens and should not be used as a sole basis for treatment. Nasal washings and aspirates are unacceptable for Xpert Xpress SARS-CoV-2/FLU/RSV testing.  Fact Sheet for Patients: bloggercourse.com  Fact Sheet for Healthcare Providers: seriousbroker.it  This test is not yet approved or cleared by the [place]  FDA and has been authorized for detection and/or diagnosis of SARS-CoV-2 by FDA under an Emergency Use Authorization (EUA). This EUA will remain in effect (meaning this test can be used) for the duration of the COVID-19 declaration under Section 564(b)(1) of the Act, 21 U.S.C. section 360bbb-3(b)(1), unless the authorization is terminated or revoked.     Resp Syncytial Virus by PCR NEGATIVE NEGATIVE Final    Comment: (NOTE) Fact Sheet for Patients: bloggercourse.com  Fact Sheet for Healthcare  Providers: seriousbroker.it  This test is not yet approved or cleared by the [place]  FDA and has been authorized for detection and/or diagnosis of SARS-CoV-2 by FDA under an Emergency Use Authorization (EUA). This EUA will remain in effect (meaning this test can be used) for the duration of the COVID-19 declaration under Section 564(b)(1) of the Act, 21 U.S.C. section 360bbb-3(b)(1), unless the authorization is terminated or revoked.  Performed at [facility], [address] [place], [place] [address]      Radiology Studies: DG Abd 1 View Result Date: 10/06/2024 EXAM: 1 VIEW XRAY OF THE ABDOMEN 10/06/2024 07:01:31 AM COMPARISON: CT abdomen and pelvis 04/18/2022.  CLINICAL HISTORY: 63 year old female with abdominal pain. FINDINGS: BOWEL: Moderate colonic stool burden. Nonobstructive bowel gas pattern. SOFT TISSUES: Vascular calcifications. No opaque urinary calculi. BONES: No acute osseous abnormality. IMPRESSION: 1. Nonobstructive bowel gas pattern with moderate colonic stool burden. Electronically signed by: Helayne Hurst MD 10/06/2024 07:19 AM EST RP Workstation: [id number]   ECHOCARDIOGRAM COMPLETE Result Date: 10/04/2024    ECHOCARDIOGRAM REPORT   Patient Name:   [name] Date of Exam: 10/04/2024 Medical Rec #:  [id number]     Height:       63.0 in Accession #:    [id number]    Weight:       202.0 lb Date of Birth:  [birth year]    BSA:          1.942 m Patient Age:    62 years      BP:           128/95 mmHg Patient Gender: F             HR:           100 bpm. Exam Location:  Inpatient Procedure: 2D Echo and Intracardiac Opacification Agent (Both Spectral and Color            Flow Doppler were utilized during procedure). Indications:    CHF  History:        Patient has prior history of Echocardiogram examinations. CHF,                 COPD; Risk Factors:Hypertension.  Sonographer:    Charmaine Gaskins Referring Phys: [id number] SUBRINA SUNDIL IMPRESSIONS   1. Global hypokinesis distal septal/ inferior basal akinesis . Left ventricular ejection fraction, by estimation, is 30 to 35%. The left ventricle has moderately decreased function. The left ventricle demonstrates global hypokinesis. The left ventricular internal cavity size was moderately dilated. Left ventricular diastolic parameters are consistent with Grade I diastolic dysfunction (impaired relaxation).  2. Right ventricular systolic function is normal. The right ventricular size is normal.  3. Left atrial size was moderately dilated.  4. The mitral valve is degenerative. Moderate to severe mitral valve regurgitation. No evidence of mitral stenosis.  5. Tricuspid valve regurgitation is moderate.  6. The aortic valve is tricuspid. There is mild calcification of the aortic valve. Aortic valve regurgitation is trivial. Aortic valve sclerosis is present, with no evidence of aortic valve stenosis.  7. The inferior vena cava is normal in size with greater than 50% respiratory variability, suggesting right atrial pressure of 3 mmHg. FINDINGS  Left Ventricle: Global hypokinesis distal septal/ inferior basal akinesis. Left ventricular ejection fraction, by estimation, is 30 to 35%. The left ventricle has moderately decreased function. The left ventricle demonstrates global hypokinesis. Definity  contrast agent was given IV to delineate the left ventricular endocardial borders. Strain was performed and the global longitudinal strain is indeterminate. The left ventricular internal cavity size was moderately dilated. There is no left ventricular hypertrophy. Left ventricular diastolic parameters are consistent with Grade I diastolic dysfunction (impaired relaxation). Right Ventricle: The right ventricular size is normal. No increase in right ventricular wall thickness. Right ventricular systolic function is normal. Left Atrium: Left atrial size was moderately dilated. Right Atrium: Right atrial size was normal in size.  Pericardium: There is no evidence of pericardial effusion. Mitral Valve: The mitral valve is degenerative in appearance. There is mild thickening of the mitral valve leaflet(s). There is mild calcification of the mitral valve leaflet(s). Mild mitral annular calcification. Moderate to severe mitral valve  regurgitation. No evidence of mitral valve stenosis. MV peak gradient, 6.6 mmHg. The mean mitral valve gradient is 3.0 mmHg. Tricuspid Valve: The tricuspid valve is normal in structure. Tricuspid valve regurgitation is moderate . No evidence of tricuspid stenosis. Aortic Valve: The aortic valve is tricuspid. There is mild calcification of the aortic valve. Aortic valve regurgitation is trivial. Aortic valve sclerosis is present, with no evidence of aortic valve stenosis. Pulmonic Valve: The pulmonic valve was normal in structure. Pulmonic valve regurgitation is not visualized. No evidence of pulmonic stenosis. Aorta: The aortic root is normal in size and structure. Venous: The inferior vena cava is normal in size with greater than 50% respiratory variability, suggesting right atrial pressure of 3 mmHg. IAS/Shunts: No atrial level shunt detected by color flow Doppler. Additional Comments: 3D was performed not requiring image post processing on an independent workstation and was indeterminate.  LEFT VENTRICLE PLAX 2D LVIDd:         6.30 cm      Diastology LVIDs:         5.30 cm      LV e' medial:    4.90 cm/s LV PW:         0.90 cm      LV E/e' medial:  21.4 LV IVS:        1.00 cm      LV e' lateral:   10.00 cm/s LVOT diam:     2.10 cm      LV E/e' lateral: 10.5 LVOT Area:     3.46 cm  LV Volumes (MOD) LV vol d, MOD A2C: 207.0 ml LV vol d, MOD A4C: 216.0 ml LV vol s, MOD A2C: 145.0 ml LV vol s, MOD A4C: 171.0 ml LV SV MOD A2C:     62.0 ml LV SV MOD A4C:     216.0 ml LV SV MOD BP:      58.0 ml RIGHT VENTRICLE RV Basal diam:  3.50 cm RV Mid diam:    3.20 cm RV S prime:     11.00 cm/s LEFT ATRIUM              Index         RIGHT ATRIUM           Index LA diam:        4.30 cm  2.21 cm/m   RA Area:     20.60 cm LA Vol (A2C):   79.8 ml  41.10 ml/m  RA Volume:   63.10 ml  32.49 ml/m LA Vol (A4C):   104.0 ml 53.56 ml/m LA Biplane Vol: 93.6 ml  48.20 ml/m   AORTA Ao Root diam: 3.00 cm Ao Asc diam:  3.50 cm MITRAL VALVE                TRICUSPID VALVE MV Area (PHT): 5.70 cm     TR Peak grad:   69.2 mmHg MV Peak grad:  6.6 mmHg     TR Vmax:        416.00 cm/s MV Mean grad:  3.0 mmHg MV Vmax:       1.28 m/s     SHUNTS MV Vmean:      77.1 cm/s    Systemic Diam: 2.10 cm MV Decel Time: 133 msec MV E velocity: 105.00 cm/s MV A velocity: 66.00 cm/s MV E/A ratio:  1.59 Maude Emmer MD Electronically signed by Maude Emmer MD Signature Date/Time: 10/04/2024/11:16:58 AM    Final  Scheduled Meds:  sodium chloride    Intravenous Once   amiodarone   100 mg Oral Daily   budesonide  (PULMICORT ) nebulizer solution  0.25 mg Nebulization BID   fluticasone   1 spray Each Nare Daily   furosemide   80 mg Intravenous BID   insulin  aspart  0-15 Units Subcutaneous TID WC   insulin  aspart  0-5 Units Subcutaneous QHS   insulin  aspart  5 Units Subcutaneous TID WC   insulin  glargine-yfgn  50 Units Subcutaneous Daily   ipratropium-albuterol   3 mL Nebulization Q6H   isosorbide -hydrALAZINE   1 tablet Oral TID   lactulose   30 g Oral BID   levothyroxine   50 mcg Oral Q0600   nicotine   14 mg Transdermal Daily   pantoprazole  (PROTONIX ) IV  40 mg Intravenous Q12H   polyethylene glycol  17 g Oral Daily   rosuvastatin   40 mg Oral Daily   senna-docusate  2 tablet Oral BID   sodium chloride  flush  3 mL Intravenous Q12H   Continuous Infusions:   LOS: 3 days    Time spent: 27    Sigurd Pac, MD [facility]   10/06/2024, 7:51 AM

## 2024-10-06 NOTE — Progress Notes (Addendum)
 PHARMACY - ANTICOAGULATION CONSULT NOTE  Pharmacy Consult for IV heparin  Indication: atrial fibrillation  Allergies  Allergen Reactions   Neurontin  [Gabapentin ] Nausea And Vomiting and Other (See Comments)    POSSIBLE SHAKING   Lyrica  [Pregabalin ] Other (See Comments)    Shaking     Patient Measurements: Height: 5' 3 (160 cm) Weight: 101.8 kg (224 lb 6.9 oz) IBW/kg (Calculated) : 52.4 HEPARIN  DW (KG): 76.4  Vital Signs: Temp: 98.4 F (36.9 C) (11/23 1617) Temp Source: Oral (11/23 1617) BP: 113/88 (11/23 1900) Pulse Rate: 101 (11/23 1617)  Labs: Recent Labs    10/04/24 1148 10/04/24 1503 10/04/24 1651 10/05/24 0023 10/05/24 0724 10/05/24 1032 10/05/24 1327 10/05/24 1503 10/06/24 0117 10/06/24 2047  HGB 9.0*  --  8.3* 8.1*  --  8.3*  --   --  7.9*  --   HCT 30.2*  --  27.4* 26.9*  --  27.2*  --   --  26.2*  --   PLT 231  --  197 221  --  213  --   --  182  --   LABPROT 15.5*  --   --   --   --   --   --   --   --   --   INR 1.2  --   --   --   --   --   --   --   --   --   HEPARINUNFRC  --   --   --   --   --   --   --   --   --  0.76*  CREATININE  --   --  2.36* 2.41*  --   --   --   --  2.02*  --   TROPONINIHS 23*   < > 79*  --  377*  --  335* 327*  --   --    < > = values in this interval not displayed.    Estimated Creatinine Clearance: 33.3 mL/min (A) (by C-G formula based on SCr of 2.02 mg/dL (H)).   Medical History: Past Medical History:  Diagnosis Date   Acute renal failure    Acute respiratory failure with hypoxia ([name]) 12/02/2019   Alcohol abuse    Alcoholic cirrhosis ([name])    Anemia    Anxiety    Arthritis    knees (11/26/2018)   B12 deficiency    CAD (coronary artery disease)    a. 11/10/2014 Cath: LM nl, LAD min irregs, D1 30 ost, D2 50d, LCX 4m, OM1 80 p/m (1.5 mm vessel), OM2 29m, RCA nondom 91m-->med rx.. Demand ischemia in the setting of rapid a-fib.   Cardiomyopathy ([name])    Carotid artery disease    a. 01/2015 Carotid Angio:  RICA 100, LICA 95p; b. 02/2015 s/p L CEA; c. 05/2019 Carotid U/S: RICA 100. RECA >50. LICA 1-39%.   Cellulitis    lower extremities   Cellulitis in diabetic foot ([name]) 07/08/2019   CHF (congestive heart failure) ([name])    Chronic combined systolic and diastolic CHF (congestive heart failure) ([name])    a. 10/2014 Echo: EF 40-45%; b. 10/2018 Echo: EF 45-50%, gr2 DD; c. 11/2019 Echo: EF 50%, mild LVH, gr2 DD (restrictive), antlat HK, Nl RV fxn. Mild BAE. RVSP 59.52mmHg.   CKD (chronic kidney disease), stage III ([name])    Cocaine abuse ([name])    COPD (chronic obstructive pulmonary disease) ([name])    Critical ischemia of foot ([name]) 06/07/2021  Critical limb ischemia     Critical lower limb ischemia ([name])    Demand ischemia ([name]) 10/29/2014   Depression    Diabetes mellitus without complication ([name])    Diabetic peripheral neuropathy ([name])    DVT (deep venous thrombosis) ([name])    Dyspnea    Elevated troponin    a. Chronic elevation.   Fall 05/05/2021   Femoro-popliteal artery disease    Peripheral arterial disease/critical limb ischemia     GERD (gastroesophageal reflux disease)    Hyperlipemia    Hypertension    Hypokalemia    Hypomagnesemia    Hypothyroidism    Marijuana abuse    Narcotic abuse ([name])    Noncompliance    NSVT (nonsustained ventricular tachycardia) ([name])    Obesity    Osteomyelitis ([name]) 06/21/2019   PAF (paroxysmal atrial fibrillation) ([name])    Paroxysmal atrial tachycardia    Peripheral arterial disease    a. 01/2015 Angio/PTA: RSFA 99 (atherectomy/pta) - 1 vessel runoff via diff dzs peroneal; b. 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   Pneumonia    once or twice (11/26/2018)   Poorly controlled type 2 diabetes mellitus ([name])    Renal disorder    Renal insufficiency    a. 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   SIRS (systemic inflammatory response syndrome) ([name]) 04/06/2017   Sleep apnea    couldn't handle wearing the mask (11/26/2018)   Symptomatic  bradycardia    a. Avoid AV blocking agent per EP. Prev req temp wire in 2017.   Tobacco abuse    Wrist fracture, left, closed, initial encounter 01/29/2015    Medications:  Infusions:   amiodarone  Stopped (10/06/24 2110)   Followed by   NOREEN ON 10/07/2024] amiodarone  Stopped (10/07/24 0245)   heparin  900 Units/hr (10/06/24 1202)    Assessment: 63 yo female on chronic Eliquis  for afib, currently on hold for afib.  Pharmacy asked to dose  IV heparin  with no bolus  -heparin  level= 0.76 (due to recent Eliquis ) -aPTT= 34  Goal of Therapy:  Heparin  level 0.3-0.5 Monitor platelets by anticoagulation protocol: Yes   Plan:  -Increase heparin  to 1100 units/hr -heparin  level in  8hrs  Prentice Poisson, PharmD Clinical Pharmacist **Pharmacist phone directory can now be found on [email or web address] ([email or web address]).  Listed under [facility].

## 2024-10-06 NOTE — Progress Notes (Signed)
 Rounding Note    Patient Name: [name] Date of Encounter: 10/06/2024  [facility] Cardiologist: Dorn Lesches, MD  CC: Abdominal pain and bloody stools Reason of Consult: Elevated troponins  Subjective   Sitting up in chair. No longer experiences neck pain or shoulder pain. States she had a bowel movement which was nonbloody and not melanotic  Inpatient Medications    Scheduled Meds:  sodium chloride    Intravenous Once   amiodarone   100 mg Oral Daily   budesonide  (PULMICORT ) nebulizer solution  0.25 mg Nebulization BID   fluticasone   1 spray Each Nare Daily   furosemide   80 mg Intravenous BID   insulin  aspart  0-15 Units Subcutaneous TID WC   insulin  aspart  0-5 Units Subcutaneous QHS   insulin  aspart  5 Units Subcutaneous TID WC   insulin  glargine-yfgn  50 Units Subcutaneous Daily   ipratropium-albuterol   3 mL Nebulization Q6H   isosorbide -hydrALAZINE   1 tablet Oral TID   lactulose   30 g Oral BID   levothyroxine   50 mcg Oral Q0600   nicotine   14 mg Transdermal Daily   pantoprazole  (PROTONIX ) IV  40 mg Intravenous Q12H   polyethylene glycol  17 g Oral Daily   rosuvastatin   40 mg Oral Daily   senna-docusate  2 tablet Oral BID   sodium chloride  flush  3 mL Intravenous Q12H   Continuous Infusions:  heparin  900 Units/hr (10/06/24 1202)   PRN Meds: acetaminophen  **OR** acetaminophen , ipratropium-albuterol , nicotine  polacrilex, ondansetron  **OR** ondansetron  (ZOFRAN ) IV, oxyCODONE , sodium chloride  flush, sodium chloride  flush   Vital Signs    Vitals:   10/06/24 0409 10/06/24 0808 10/06/24 0859 10/06/24 1326  BP: (!) 145/75 134/67  (!) 124/58  Pulse: 98 98  97  Resp: (!) 22 (!) 21  19  Temp: 98.2 F (36.8 C) 97.9 F (36.6 C)  98.4 F (36.9 C)  TempSrc: Oral Oral  Oral  SpO2: 95% 97% 97% 100%  Weight: 101.8 kg     Height: 5' 3 (1.6 m)       Intake/Output Summary (Last 24 hours) at 10/06/2024 1439 Last data filed at 10/06/2024 1300 Gross  per 24 hour  Intake 1440 ml  Output 2000 ml  Net -560 ml      10/06/2024    4:09 AM 10/05/2024    5:00 AM 09/30/2024   10:44 AM  Last 3 Weights  Weight (lbs) 224 lb 6.9 oz 224 lb 6.9 oz 202 lb  Weight (kg) 101.8 kg 101.8 kg 91.627 kg      Telemetry    Sinus rhythm with PVCs and 1 asymptomatic episode of NSVT 9 beats in duration- Personally Reviewed  ECG    No new tracing- Personally Reviewed  Physical Exam   General: Appears older than stated age, hemodynamically stable, audible wheezing, on nasal cannula oxygen  HEENT: Normocephalic, atraumatic, trachea midline, dry mucous membranes, JVP Lungs: Diffuse bilateral wheezing on physical examination. Heart: Regular, positive S1-S2, no murmurs rubs or gallops appreciated secondary to tachycardia Abdomen: Obese, soft, nontender, nondistended, positive bowel sounds in all 4 quadrants Extremities: Warm to touch bilaterally, +1 pitting edema, right TMA Neuro: No focal neurological deficits Psych: Appropriate and cooperative No change in physical exam since yesterday  Labs    High Sensitivity Troponin:   Recent Labs  Lab 10/04/24 1503 10/04/24 1651 10/05/24 0724 10/05/24 1327 10/05/24 1503  TROPONINIHS 56* 79* 377* 335* 327*     Chemistry Recent Labs  Lab 10/03/24 1142 10/04/24 0425 10/04/24  1651 10/05/24 0023 10/06/24 0117  NA 142 139 139 138 136  K 5.0 4.7 4.6 4.5 4.0  CL 103 102 100 98 97*  CO2 22 24 24 23 27   GLUCOSE 210* 315* 311* 299* 313*  BUN 98* 99* 92* 97* 85*  CREATININE 2.46* 2.45* 2.36* 2.41* 2.02*  CALCIUM  8.4* 8.1* 8.3* 8.1* 8.0*  MG  --   --   --  2.5* 2.4  PROT 6.2* 6.6  --   --   --   ALBUMIN  3.2* 3.4*  --   --   --   AST 17 24  --   --   --   ALT 18 20  --   --   --   ALKPHOS 94 90  --   --   --   BILITOT 0.5 0.6  --   --   --   GFRNONAA 22* 22* 23* 22* 28*  ANIONGAP 17* 13 15 17* 12    Lipids No results for input(s): CHOL, TRIG, HDL, LABVLDL, LDLCALC, CHOLHDL in the last  168 hours.  Hematology Recent Labs  Lab 10/05/24 0023 10/05/24 1032 10/06/24 0117  WBC 14.1* 17.8* 15.0*  RBC 2.75* 2.83* 2.71*  HGB 8.1* 8.3* 7.9*  HCT 26.9* 27.2* 26.2*  MCV 97.8 96.1 96.7  MCH 29.5 29.3 29.2  MCHC 30.1 30.5 30.2  RDW 18.8* 18.8* 18.9*  PLT 221 213 182   Thyroid   Recent Labs  Lab 10/04/24 1148  TSH 1.580  FREET4 1.12    BNP Recent Labs  Lab 10/03/24 1142 10/04/24 0000  BNP 386.8* 405.1*    DDimer No results for input(s): DDIMER in the last 168 hours.   Radiology    N/A  Cardiac Studies   Echocardiogram 03/11/24 IMPRESSIONS     1. Left ventricular ejection fraction, by estimation, is 35 to 40%. The  left ventricle has moderately decreased function. The left ventricle  demonstrates global hypokinesis. The left ventricular internal cavity size  was upper limit of normal. Left  ventricular diastolic parameters are consistent with Grade I diastolic  dysfunction (impaired relaxation).   2. Right ventricular systolic function is mildly reduced. The right  ventricular size is normal. There is moderately elevated pulmonary artery  systolic pressure. The estimated right ventricular systolic pressure is  55.9 mmHg.   3. The mitral valve is degenerative. Moderate mitral valve regurgitation.  No evidence of mitral stenosis.   4. The aortic valve was not well visualized. Aortic valve regurgitation  is not visualized. No aortic stenosis is present.   5. The inferior vena cava is dilated in size with >50% respiratory  variability, suggesting right atrial pressure of 8 mmHg.   10/04/2024 1. Global hypokinesis distal septal/ inferior basal akinesis . Left  ventricular ejection fraction, by estimation, is 30 to 35%. The left  ventricle has moderately decreased function. The left ventricle  demonstrates global hypokinesis. The left  ventricular internal cavity size was moderately dilated. Left ventricular  diastolic parameters are consistent with  Grade I diastolic dysfunction  (impaired relaxation).   2. Right ventricular systolic function is normal. The right ventricular  size is normal.   3. Left atrial size was moderately dilated.   4. The mitral valve is degenerative. Moderate to severe mitral valve  regurgitation. No evidence of mitral stenosis.   5. Tricuspid valve regurgitation is moderate.   6. The aortic valve is tricuspid. There is mild calcification of the  aortic valve. Aortic valve regurgitation is trivial. Aortic  valve  sclerosis is present, with no evidence of aortic valve stenosis.   7. The inferior vena cava is normal in size with greater than 50%  respiratory variability, suggesting right atrial pressure of 3 mmHg.   Patient Profile     62 y.o. 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   Assessment & Plan    Impression & Recommendations: Precordial pain Elevated troponins Precordial pain is entirely noncardiac, no longer present since yesterday's consult EKG does not show ACS pattern High sensitive troponins peaked at 377 Her current troponin trend is likely secondary to demand ischemia in the setting of underlying cardiomyopathy, HFrEF acute exacerbation, underlying anemia, CKD. Echocardiogram results reviewed Ideally would recommend left heart catheterization to evaluate for obstructive disease.  However given the fact that she is complaining of having melanotic stools and her underlying CKD stage IV she is at high risk for contrast-induced nephropathy and progression of CKD leading to dialysis.  For now patient would like to hold off on ischemic workup which is reasonable unless of change in  clinical status.     Cardiomyopathy, regional wall motion abnormality Acute heart failure with reduced EF Stage C, NYHA class III/III Likely secondary to medication noncompliance, cigarette smoking, URI, anemia Net IO Since Admission: -2,402.7 mL [10/06/24 1439] Continue Lasix  80 mg IV push twice daily -still has bilateral lower extremity swelling, likely requires 24 to 48 hours of diuresis based on renal function Bilateral compression stockings to help mobilize fluid. Continue BiDil  20/37.5 mg p.o. 3 times daily. Uptitrate GDMT based on renal function Check CardioMEMS readings on Monday, if possible to help facilitate diuresis.   Melanotic stools, per patient Anemia States that she had a bowel movement which was nonbloody and no melanotic stools.   Still complains of abdominal pain and x-ray noted nonobstructive bowel gas pattern with moderate colonic stool burden Monitor H&H From cardiovascular standpoint may proceed with GI evaluation if needed to further evaluate her anemia   Chronic kidney disease stage IV: Avoid nephrotoxic agents, was using NSAIDs and BC powders prior to admission which is likely contributory.  Monitor BUN and creatinine   Persistent atrial fibrillation/antiarrhythmic (high risk medication)/long-term oral anticoagulation: Currently sinus rhythm.  Given her high chads Vascor and A-fib started on IV heparin  drip without bolus.   Cigarette smoking: Reemphasized importance of complete cessation.     For questions or updates, please contact [facility] Please consult [email or web address] for contact info under        Signed, Madonna Michele HAS, [facility]  A Division of [facility] [address], [place], [place] [address]  Pager: [phone] Office: [phone] 10/06/2024, 2:39 PM

## 2024-10-06 NOTE — Plan of Care (Signed)

## 2024-10-06 NOTE — Progress Notes (Signed)
 Was present for patients cardioversion.

## 2024-10-06 NOTE — Progress Notes (Addendum)
 Brief Cardiology Note:  Patient went into AF w RVR, rates in 150s, felt uncomfortable but initially HDS. Her LVEF is 35% and she was previously on amiodarone  as outpatient so we are reloading with amiodarone  gtt. She developed nausea and vomiting and we stopped amio gtt. Her HR was in 160 and 170s with SBP dropping to 60s and 70s. We trialed amio gtt again with improvement in HR down to 110s and BP, but the patient vomited again and could not tolerate it. We then had a discussion about risks and benefits of DCCV. The patient said they've been taking apixaban  5 mg BID for several months now and has not missed a dose in the last 30 days. She understood risks associated with DCCV and wanted to proceed. Her HR was in 160s and BP 80s/60s so we deferred sedation and analgesia and the patient was in agreement. RRT RN and RT were at bedside. Patient received 200 J synchronized shock x1 and converted to NSR. BP improved to 120s/80s. She felt much better. We will start metoprolol  tartrate 12.5 mg BID for now and continue heparin  gtt. Her last dose of apixaban  was this AM according to the patient.   Karla LOISE Devonshire, MD, PhD Cardiology

## 2024-10-07 ENCOUNTER — Ambulatory Visit: Admitting: "Endocrinology

## 2024-10-07 DIAGNOSIS — Z7189 Other specified counseling: Secondary | ICD-10-CM

## 2024-10-07 DIAGNOSIS — Z711 Person with feared health complaint in whom no diagnosis is made: Secondary | ICD-10-CM

## 2024-10-07 DIAGNOSIS — Z515 Encounter for palliative care: Secondary | ICD-10-CM | POA: Diagnosis not present

## 2024-10-07 DIAGNOSIS — I5021 Acute systolic (congestive) heart failure: Secondary | ICD-10-CM | POA: Diagnosis not present

## 2024-10-07 DIAGNOSIS — Z79899 Other long term (current) drug therapy: Secondary | ICD-10-CM

## 2024-10-07 DIAGNOSIS — R7989 Other specified abnormal findings of blood chemistry: Secondary | ICD-10-CM | POA: Diagnosis not present

## 2024-10-07 DIAGNOSIS — I429 Cardiomyopathy, unspecified: Secondary | ICD-10-CM

## 2024-10-07 DIAGNOSIS — I4819 Other persistent atrial fibrillation: Secondary | ICD-10-CM

## 2024-10-07 DIAGNOSIS — Z66 Do not resuscitate: Secondary | ICD-10-CM | POA: Diagnosis not present

## 2024-10-07 DIAGNOSIS — I214 Non-ST elevation (NSTEMI) myocardial infarction: Secondary | ICD-10-CM

## 2024-10-07 DIAGNOSIS — R072 Precordial pain: Secondary | ICD-10-CM | POA: Diagnosis not present

## 2024-10-07 LAB — PREPARE RBC (CROSSMATCH)

## 2024-10-07 LAB — BASIC METABOLIC PANEL WITH GFR
Anion gap: 18 — ABNORMAL HIGH (ref 5–15)
BUN: 80 mg/dL — ABNORMAL HIGH (ref 8–23)
CO2: 23 mmol/L (ref 22–32)
Calcium: 7.9 mg/dL — ABNORMAL LOW (ref 8.9–10.3)
Chloride: 92 mmol/L — ABNORMAL LOW (ref 98–111)
Creatinine, Ser: 2.47 mg/dL — ABNORMAL HIGH (ref 0.44–1.00)
GFR, Estimated: 22 mL/min — ABNORMAL LOW (ref >60)
Glucose, Bld: 167 mg/dL — ABNORMAL HIGH (ref 70–99)
Potassium: 3.9 mmol/L (ref 3.5–5.1)
Sodium: 133 mmol/L — ABNORMAL LOW (ref 135–145)

## 2024-10-07 LAB — CBC
HCT: 22.9 % — ABNORMAL LOW (ref 36.0–46.0)
Hemoglobin: 7.1 g/dL — ABNORMAL LOW (ref 12.0–15.0)
MCH: 29.7 pg (ref 26.0–34.0)
MCHC: 31 g/dL (ref 30.0–36.0)
MCV: 95.8 fL (ref 80.0–100.0)
Platelets: 175 K/uL (ref 150–400)
RBC: 2.39 MIL/uL — ABNORMAL LOW (ref 3.87–5.11)
RDW: 18.5 % — ABNORMAL HIGH (ref 11.5–15.5)
WBC: 7.7 K/uL (ref 4.0–10.5)
nRBC: 0 % (ref 0.0–0.2)

## 2024-10-07 LAB — GLUCOSE, CAPILLARY
Glucose-Capillary: 222 mg/dL — ABNORMAL HIGH (ref 70–99)
Glucose-Capillary: 224 mg/dL — ABNORMAL HIGH (ref 70–99)
Glucose-Capillary: 280 mg/dL — ABNORMAL HIGH (ref 70–99)
Glucose-Capillary: 262 mg/dL — ABNORMAL HIGH (ref 70–99)

## 2024-10-07 LAB — HEPARIN LEVEL (UNFRACTIONATED): Heparin Unfractionated: 0.74 [IU]/mL — ABNORMAL HIGH (ref 0.30–0.70)

## 2024-10-07 LAB — APTT
aPTT: 57 s — ABNORMAL HIGH (ref 24–36)
aPTT: 52 s — ABNORMAL HIGH (ref 24–36)

## 2024-10-07 LAB — HEMOGLOBIN AND HEMATOCRIT, BLOOD
HCT: 27.6 % — ABNORMAL LOW (ref 36.0–46.0)
Hemoglobin: 8.7 g/dL — ABNORMAL LOW (ref 12.0–15.0)

## 2024-10-07 LAB — MAGNESIUM: Magnesium: 2.4 mg/dL (ref 1.7–2.4)

## 2024-10-07 MED ORDER — SODIUM CHLORIDE 0.9% IV SOLUTION: Freq: Once | INTRAVENOUS | Status: AC

## 2024-10-07 MED ORDER — INSULIN GLARGINE-YFGN 100 UNIT/ML ~~LOC~~ SOLN
60.0000 [IU] | Freq: Every day | SUBCUTANEOUS | Status: DC
  Administered 2024-10-07 – 2024-10-12 (×6): 60 [IU] via SUBCUTANEOUS
  Filled 2024-10-07 (×6): qty 0.6

## 2024-10-07 MED ORDER — SALINE SPRAY 0.65 % NA SOLN
1.0000 | Freq: Once | NASAL | Status: AC
  Administered 2024-10-07: 1 via NASAL
  Filled 2024-10-07: qty 44

## 2024-10-07 NOTE — Consult Note (Signed)
 Palliative Medicine Inpatient Consult Note  Consulting Provider: Dr. Fairy  Reason for consult:   Palliative Care Consult Services Palliative Medicine Consult  Reason for Consult? Goals of care, significant disease burden, CHF, COPD, CKD   10/07/2024  HPI:  Per intake H&P --> [name] Price is a 62/F with PMH of heavy tobacco use, COPD, paroxysmal A-fib, chronic systolic CHF, CAD, PAD, CKD 4, gout, hypothyroidism, recurrent GI bleed presented to the ED with volume overload as well as anemia.  Palliative care has been asked to support goals of care conversations.   Clinical Assessment/Goals of Care:  *Please note that this is a verbal dictation therefore any spelling or grammatical errors are due to the Dragon Medical One system interpretation.  I have reviewed medical records including EPIC notes, labs and imaging, received report from bedside RN, assessed the patient who is lying in bed in NAD.    I met with [name] Price to further discuss diagnosis prognosis, GOC, EOL wishes, disposition and options.   I introduced Palliative Medicine as specialized medical care for people living with serious illness. It focuses on providing relief from the symptoms and stress of a serious illness. The goal is to improve quality of life for both the patient and the family.  Medical History Review and Understanding:  [name] Price has a past medical history significant for COPD, tobacco abuse, paroxysmal atrial fibrillation, systolic heart failure, peripheral arterial disease, chronic kidney disease stage IV, coronary artery disease, hypothyroidism, and gastrointestinal bleeding.  Social History:  [name] Price is from [place], [name] .  She shares that she was married though is divorced.  She has a daughter who is blind in one eye, and is identified to have delay(s) but [name] shares she does not notice them. [name] Price is one of four children though her older sister died in a care accident. 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  She shares that she does have strong faith practicing within the [facility] denomination of Christianity.  Functional and Nutritional State:  Preceding hospitalization, [name] Price was living in an apartment with her sister [name].  She shares that she is able to participate in basic activities of daily living though sometimes her sister needs to help her place her socks on.  She does share she has had falls and states the need for an electric wheelchair.  [name] Price has not had deviations in her nutrition.  Advance Directives:  A detailed discussion was had today regarding advanced directives.  [name] Price has not created advanced directives though does understand the importance of creating these documents.  [name] Price does state if she were unable to make decisions for herself she would want her sister,  [name] Price to be her surrogate management consultant.  Code Status:  Concepts specific to code status, artifical feeding and hydration, continued IV antibiotics and rehospitalization was had.  The difference between a aggressive medical intervention path  and a palliative comfort care path for this patient at this time was had.   I completed a MOST form today. The patient and family outlined their wishes for the following treatment decisions:  Cardiopulmonary Resuscitation: Do Not Attempt Resuscitation (DNR/No CPR)  Medical Interventions: Limited Additional Interventions: Use medical treatment, IV fluids and cardiac monitoring as indicated, DO NOT USE intubation or mechanical ventilation. May consider use of less invasive airway support such as BiPAP or CPAP. Also provide comfort measures. Transfer to the hospital if indicated. Avoid intensive care.   Antibiotics: Determine  use of limitation of antibiotics when infection occurs  IV Fluids: IV fluids for a defined trial period  Feeding Tube: No feeding tube   Discussion:  [name] Price  and I discussed her reason for admission to the hospital inclusive up of upper gastrointestinal bleed.  We reviewed at this time additional intervention is not being sought due to the fact that [name] Price has a tenuous cardiac and pulmonary status.  We reviewed that labs are being monitored each day and she is on a medication to help manage upper GI bleeding.  [name] Price and I reviewed the significance of her chronic disease burden I shared with her that both her heart failure and COPD are disease processes that will worsen over time.  Ideally the medication management is pursued as well as lifestyle adjustments and changes to halt the progression of the disease.  I shared is not uncommon though despite those interventions for disease progression to occur and if things were to worsen and her circumstances I would worry her outcomes would be quite poor.  Should that be the reality I gently broached the topic of hospice care. I described hospice as a service for patients who have a life expectancy of 6 months or less. The goal of hospice is the preservation of dignity and quality at the end phases of life. Under hospice care, the focus changes from curative to symptom relief.   [name] Price shares at this time her goals are to continue current measures.  She would ideally like to take her daughter who lives in [place]  on a vacation at some point in time.  [name] Price does share understanding of her disease processes and is willing to have outpatient palliative care and support follow along with her.  Discussed the importance of continued conversation with family and their  medical providers regarding overall plan of care and treatment options, ensuring decisions are within the context of the patients values and GOCs.  Decision Maker: [name] Price (Sister): [phone] (Home Phone)   SUMMARY OF RECOMMENDATIONS   DNAR/DNI  MOST form completed and placed in think of  Open and honest conversations held in the  setting of [name] Price's acute on chronic disease burden  [name] Price would like to allow time for outcomes and treatment is treatable  OP Palliative support requested for discharge  Ongoing PMT support  Code Status/Advance Care Planning: DNAR/DNI  Palliative Prophylaxis:  Aspiration, Bowel Regimen, Delirium Protocol, Frequent Pain Assessment, Oral Care, Palliative Wound Care, and Turn Reposition  Additional Recommendations (Limitations, Scope, Preferences): Continue current care measures  Psycho-social/Spiritual:  Desire for further Chaplaincy support: Yes Additional Recommendations: Education on chronic disease burden   Prognosis: High 12 month mortality risk  Discharge Planning: Discharge plan to be determined.   Vitals:   10/07/24 1447 10/07/24 1631  BP:  128/65  Pulse:  80  Resp:  17  Temp:  97.8 F (36.6 C)  SpO2: 96% 100%    Intake/Output Summary (Last 24 hours) at 10/07/2024 1637 Last data filed at 10/07/2024 1313 Gross per 24 hour  Intake 524.32 ml  Output 601 ml  Net -76.68 ml   Last Weight  Most recent update: 10/07/2024  5:19 AM    Weight  103.4 kg (227 lb 15.3 oz)             LABS: CBC:    Component Value Date/Time   WBC 7.7 10/07/2024 0608   HGB 7.1 (L) 10/07/2024 0608   HGB 10.5 (L) 05/25/2021 1444   HCT 22.9 (L) 10/07/2024 9391  HCT 35.5 05/25/2021 1444   PLT 175 10/07/2024 0608   PLT 368 05/25/2021 1444   MCV 95.8 10/07/2024 0608   MCV 88 05/25/2021 1444   NEUTROABS 13.8 (H) 10/06/2024 0117   NEUTROABS 9.5 (H) 06/20/2019 1125   LYMPHSABS 0.4 (L) 10/06/2024 0117   LYMPHSABS 1.6 06/20/2019 1125   MONOABS 0.6 10/06/2024 0117   EOSABS 0.0 10/06/2024 0117   EOSABS 0.3 06/20/2019 1125   BASOSABS 0.0 10/06/2024 0117   BASOSABS 0.0 06/20/2019 1125   Comprehensive Metabolic Panel:    Component Value Date/Time   NA 133 (L) 10/07/2024 0608   NA 140 09/07/2022 1144   K 3.9 10/07/2024 0608   CL 92 (L) 10/07/2024 0608   CO2 23 10/07/2024 0608    BUN 80 (H) 10/07/2024 0608   BUN 26 (H) 09/07/2022 1144   CREATININE 2.47 (H) 10/07/2024 0608   CREATININE 2.01 (H) 01/23/2024 1444   GLUCOSE 167 (H) 10/07/2024 0608   CALCIUM  7.9 (L) 10/07/2024 0608   AST 24 10/04/2024 0425   ALT 20 10/04/2024 0425   ALKPHOS 90 10/04/2024 0425   BILITOT 0.6 10/04/2024 0425   BILITOT <0.2 07/07/2022 1155   PROT 6.6 10/04/2024 0425   PROT 6.7 07/07/2022 1155   ALBUMIN  3.4 (L) 10/04/2024 0425   ALBUMIN  4.0 07/07/2022 1155   Gen:  Older Caucasian F chronically ill appearing HEENT: moist mucous membranes CV: Regular rate and irregular rhythm  PULM:  On 4LPM [place], breathing is even and nonlabored ABD: soft/nontender  EXT: No edema  Neuro: Alert and oriented x3   PPS: 30%   This conversation/these recommendations were discussed with patient primary care team, Dr. Fairy ______________________________________________________ Rosaline Becton [facility] Palliative Medicine Team Team Cell Phone: [phone] Please utilize secure chat with additional questions, if there is no response within 30 minutes please call the above phone number  Total Time: 75 Billing based on MDM: High  Palliative Medicine Team providers are available by phone from 7am to 7pm daily and can be reached through the team cell phone.  Should this patient require assistance outside of these hours, please call the patient's attending physician.

## 2024-10-07 NOTE — Progress Notes (Signed)
 PHARMACY - ANTICOAGULATION CONSULT NOTE  Pharmacy Consult for IV heparin  Indication: atrial fibrillation  Allergies  Allergen Reactions   Neurontin  [Gabapentin ] Nausea And Vomiting and Other (See Comments)    POSSIBLE SHAKING   Lyrica  [Pregabalin ] Other (See Comments)    Shaking     Patient Measurements: Height: 5' 3 (160 cm) Weight: 103.4 kg (227 lb 15.3 oz) IBW/kg (Calculated) : 52.4 HEPARIN  DW (KG): 76.4  Vital Signs: Temp: 97.8 F (36.6 C) (11/24 1801) Temp Source: Oral (11/24 1801) BP: 144/81 (11/24 1801) Pulse Rate: 83 (11/24 1801)  Labs: Recent Labs    10/05/24 0023 10/05/24 0724 10/05/24 1032 10/05/24 1327 10/05/24 1503 10/06/24 0117 10/06/24 2047 10/06/24 2237 10/07/24 0608 10/07/24 1735  HGB 8.1*  --  8.3*  --   --  7.9*  --   --  7.1*  --   HCT 26.9*  --  27.2*  --   --  26.2*  --   --  22.9*  --   PLT 221  --  213  --   --  182  --   --  175  --   APTT  --   --   --   --   --   --  34  --  57* 52*  HEPARINUNFRC  --   --   --   --   --   --  0.76*  --  0.74*  --   CREATININE 2.41*  --   --   --   --  2.02*  --   --  2.47*  --   TROPONINIHS  --    < >  --    < > 327*  --  361* 470*  --   --    < > = values in this interval not displayed.    Estimated Creatinine Clearance: 27.5 mL/min (A) (by C-G formula based on SCr of 2.47 mg/dL (H)).   Medical History: Past Medical History:  Diagnosis Date   Acute renal failure    Acute respiratory failure with hypoxia ([name]) 12/02/2019   Alcohol abuse    Alcoholic cirrhosis ([name])    Anemia    Anxiety    Arthritis    knees (11/26/2018)   B12 deficiency    CAD (coronary artery disease)    a. 11/10/2014 Cath: LM nl, LAD min irregs, D1 30 ost, D2 50d, LCX 81m, OM1 80 p/m (1.5 mm vessel), OM2 62m, RCA nondom 54m-->med rx.. Demand ischemia in the setting of rapid a-fib.   Cardiomyopathy ([name])    Carotid artery disease    a. 01/2015 Carotid Angio: RICA 100, LICA 95p; b. 02/2015 s/p L CEA; c. 05/2019 Carotid  U/S: RICA 100. RECA >50. LICA 1-39%.   Cellulitis    lower extremities   Cellulitis in diabetic foot ([name]) 07/08/2019   CHF (congestive heart failure) ([name])    Chronic combined systolic and diastolic CHF (congestive heart failure) ([name])    a. 10/2014 Echo: EF 40-45%; b. 10/2018 Echo: EF 45-50%, gr2 DD; c. 11/2019 Echo: EF 50%, mild LVH, gr2 DD (restrictive), antlat HK, Nl RV fxn. Mild BAE. RVSP 59.53mmHg.   CKD (chronic kidney disease), stage III ([name])    Cocaine abuse ([name])    COPD (chronic obstructive pulmonary disease) ([name])    Critical ischemia of foot ([name]) 06/07/2021   Critical limb ischemia     Critical lower limb ischemia ([name])    Demand ischemia ([name]) 10/29/2014   Depression  Diabetes mellitus without complication ([name])    Diabetic peripheral neuropathy ([name])    DVT (deep venous thrombosis) ([name])    Dyspnea    Elevated troponin    a. Chronic elevation.   Fall 05/05/2021   Femoro-popliteal artery disease    Peripheral arterial disease/critical limb ischemia     GERD (gastroesophageal reflux disease)    Hyperlipemia    Hypertension    Hypokalemia    Hypomagnesemia    Hypothyroidism    Marijuana abuse    Narcotic abuse ([name])    Noncompliance    NSVT (nonsustained ventricular tachycardia) ([name])    Obesity    Osteomyelitis ([name]) 06/21/2019   PAF (paroxysmal atrial fibrillation) ([name])    Paroxysmal atrial tachycardia    Peripheral arterial disease    a. 01/2015 Angio/PTA: RSFA 99 (atherectomy/pta) - 1 vessel runoff via diff dzs peroneal; b. 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   Pneumonia    once or twice (11/26/2018)   Poorly controlled type 2 diabetes mellitus ([name])    Renal disorder    Renal insufficiency    a. 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   SIRS (systemic inflammatory response syndrome) ([name]) 04/06/2017   Sleep apnea    couldn't handle wearing the mask (11/26/2018)   Symptomatic bradycardia    a. Avoid AV blocking agent per EP. Prev req  temp wire in 2017.   Tobacco abuse    Wrist fracture, left, closed, initial encounter 01/29/2015    Medications:  Infusions:   amiodarone  Stopped (10/07/24 0245)   heparin  1,250 Units/hr (10/07/24 1223)    Assessment: 63 yo female on chronic Eliquis  for afib, currently on hold for melena / GI bleed.  Pharmacy asked to start IV heparin  with no bolus.  Hgb trending down 7.9 > 7.1 with orders for PRBC x 1 11/24.  Of note, patient went into afib overnight 11/23, did not tolerate amiodarone , and required DCCV.  Pharmacy aiming for low end of heparin  level / aPTT goal.  Currently monitoring both levels with recent apixaban  use (last dose 11/20 AM).  Heparin  level remains falsely elevated.  aPTT 52sec is below goal on 1250 units/hr.  Goal of Therapy:  Heparin  level 0.3-0.5 aPTT 66-85 sec Monitor platelets by anticoagulation protocol: Yes   Plan:  Increase IV heparin  to 1350 units/hr. Daily aPTT, heparin  level, and CBC. Monitor s/s bleeding    Olam Chalk Pharm.D. CPP, BCPS Clinical Pharmacist [phone] 10/07/2024 6:46 PM   **Pharmacist phone directory can be found on [email or web address] listed under [facility].  10/07/2024 6:45 PM

## 2024-10-07 NOTE — Progress Notes (Signed)
 Orthopedic Tech Progress Note Patient Details:  [name] 08-Apr-1961 [id number]  Went to apply Karla DIALS, RN stopped me and told me MD has an order for [name], so at this moment that is what patient is wearing. Stated she would reach out to me and let me know if they want go ahead with U.Bs or discontinue the order   Patient ID: [name], female   DOB: 03/30/1961, 63 y.o.   MRN: [id number]  [name] 10/07/2024, 9:49 AM

## 2024-10-07 NOTE — Plan of Care (Signed)
  Problem: Education: Goal: Ability to identify signs and symptoms of gastrointestinal bleeding will improve Outcome: Progressing   Problem: Bowel/Gastric: Goal: Will show no signs and symptoms of gastrointestinal bleeding Outcome: Progressing   Problem: Fluid Volume: Goal: Will show no signs and symptoms of excessive bleeding Outcome: Progressing   

## 2024-10-07 NOTE — Progress Notes (Signed)
 PHARMACY - ANTICOAGULATION CONSULT NOTE  Pharmacy Consult for IV heparin  Indication: atrial fibrillation  Allergies  Allergen Reactions   Neurontin  [Gabapentin ] Nausea And Vomiting and Other (See Comments)    POSSIBLE SHAKING   Lyrica  [Pregabalin ] Other (See Comments)    Shaking     Patient Measurements: Height: 5' 3 (160 cm) Weight: 103.4 kg (227 lb 15.3 oz) IBW/kg (Calculated) : 52.4 HEPARIN  DW (KG): 76.4  Vital Signs: Temp: 98.7 F (37.1 C) (11/24 0820) Temp Source: Oral (11/24 0820) BP: 125/51 (11/24 0820) Pulse Rate: 85 (11/24 0700)  Labs: Recent Labs    10/04/24 1148 10/04/24 1503 10/05/24 0023 10/05/24 0724 10/05/24 1032 10/05/24 1327 10/05/24 1503 10/06/24 0117 10/06/24 2047 10/06/24 2237 10/07/24 0608  HGB 9.0*   < > 8.1*  --  8.3*  --   --  7.9*  --   --  7.1*  HCT 30.2*   < > 26.9*  --  27.2*  --   --  26.2*  --   --  22.9*  PLT 231   < > 221  --  213  --   --  182  --   --  175  APTT  --   --   --   --   --   --   --   --  34  --  57*  LABPROT 15.5*  --   --   --   --   --   --   --   --   --   --   INR 1.2  --   --   --   --   --   --   --   --   --   --   HEPARINUNFRC  --   --   --   --   --   --   --   --  0.76*  --  0.74*  CREATININE  --    < > 2.41*  --   --   --   --  2.02*  --   --  2.47*  TROPONINIHS 23*   < >  --    < >  --    < > 327*  --  361* 470*  --    < > = values in this interval not displayed.    Estimated Creatinine Clearance: 27.5 mL/min (A) (by C-G formula based on SCr of 2.47 mg/dL (H)).   Medical History: Past Medical History:  Diagnosis Date   Acute renal failure    Acute respiratory failure with hypoxia ([name]) 12/02/2019   Alcohol abuse    Alcoholic cirrhosis ([name])    Anemia    Anxiety    Arthritis    knees (11/26/2018)   B12 deficiency    CAD (coronary artery disease)    a. 11/10/2014 Cath: LM nl, LAD min irregs, D1 30 ost, D2 50d, LCX 81m, OM1 80 p/m (1.5 mm vessel), OM2 37m, RCA nondom 82m-->med rx.. Demand  ischemia in the setting of rapid a-fib.   Cardiomyopathy ([name])    Carotid artery disease    a. 01/2015 Carotid Angio: RICA 100, LICA 95p; b. 02/2015 s/p L CEA; c. 05/2019 Carotid U/S: RICA 100. RECA >50. LICA 1-39%.   Cellulitis    lower extremities   Cellulitis in diabetic foot ([name]) 07/08/2019   CHF (congestive heart failure) ([name])    Chronic combined systolic and diastolic CHF (congestive heart failure) ([name])    a. 10/2014  Echo: EF 40-45%; b. 10/2018 Echo: EF 45-50%, gr2 DD; c. 11/2019 Echo: EF 50%, mild LVH, gr2 DD (restrictive), antlat HK, Nl RV fxn. Mild BAE. RVSP 59.33mmHg.   CKD (chronic kidney disease), stage III ([name])    Cocaine abuse ([name])    COPD (chronic obstructive pulmonary disease) ([name])    Critical ischemia of foot ([name]) 06/07/2021   Critical limb ischemia     Critical lower limb ischemia ([name])    Demand ischemia ([name]) 10/29/2014   Depression    Diabetes mellitus without complication ([name])    Diabetic peripheral neuropathy ([name])    DVT (deep venous thrombosis) ([name])    Dyspnea    Elevated troponin    a. Chronic elevation.   Fall 05/05/2021   Femoro-popliteal artery disease    Peripheral arterial disease/critical limb ischemia     GERD (gastroesophageal reflux disease)    Hyperlipemia    Hypertension    Hypokalemia    Hypomagnesemia    Hypothyroidism    Marijuana abuse    Narcotic abuse ([name])    Noncompliance    NSVT (nonsustained ventricular tachycardia) ([name])    Obesity    Osteomyelitis ([name]) 06/21/2019   PAF (paroxysmal atrial fibrillation) ([name])    Paroxysmal atrial tachycardia    Peripheral arterial disease    a. 01/2015 Angio/PTA: RSFA 99 (atherectomy/pta) - 1 vessel runoff via diff dzs peroneal; b. 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   Pneumonia    once or twice (11/26/2018)   Poorly controlled type 2 diabetes mellitus ([name])    Renal disorder    Renal insufficiency    a. 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   SIRS (systemic inflammatory  response syndrome) ([name]) 04/06/2017   Sleep apnea    couldn't handle wearing the mask (11/26/2018)   Symptomatic bradycardia    a. Avoid AV blocking agent per EP. Prev req temp wire in 2017.   Tobacco abuse    Wrist fracture, left, closed, initial encounter 01/29/2015    Medications:  Infusions:   amiodarone  Stopped (10/07/24 0245)   heparin  1,100 Units/hr (10/06/24 2151)    Assessment: 63 yo female on chronic Eliquis  for afib, currently on hold for melena / GI bleed.  Pharmacy asked to start IV heparin  with no bolus.  Hgb trending down 7.9 > 7.1 with orders for PRBC x 1 11/24.  Of note, patient went into afib overnight 11/23, did not tolerate amiodarone , and required DCCV.  Pharmacy aiming for low end of heparin  level / aPTT goal.  Currently monitoring both levels with recent apixaban  use (last dose 11/20 AM).  Heparin  level remains falsely elevated.  aPTT slightly below goal on 1100 units/hr.  Goal of Therapy:  Heparin  level 0.3-0.5 aPTT 66-85 sec Monitor platelets by anticoagulation protocol: Yes   Plan:  Increase IV heparin  to 1250 units/hr. Check aPTT in 8 hrs Daily aPTT, heparin  level, and CBC.  [employer], Pharm.D., BCPS Clinical Pharmacist Clinical phone for 10/07/2024 from 7:30-3:00 is [phone].  **Pharmacist phone directory can be found on [email or web address] listed under [facility].  10/07/2024 8:50 AM

## 2024-10-07 NOTE — Progress Notes (Signed)
 Progress Note  Patient Name: [name] Date of Encounter: 10/07/2024  Primary Cardiologist: Dorn Lesches, MD   Subjective   Patient seen examined at her bedside.  She is sitting up when I arrived.  She offers no complaints at this time tells me she feels a lot better compared to yesterday.  Inpatient Medications    Scheduled Meds:  sodium chloride    Intravenous Once   sodium chloride    Intravenous Once   budesonide  (PULMICORT ) nebulizer solution  0.25 mg Nebulization BID   fluticasone   1 spray Each Nare Daily   furosemide   80 mg Intravenous BID   insulin  aspart  0-15 Units Subcutaneous TID WC   insulin  aspart  0-5 Units Subcutaneous QHS   insulin  aspart  5 Units Subcutaneous TID WC   insulin  glargine-yfgn  60 Units Subcutaneous Daily   ipratropium-albuterol   3 mL Nebulization Q6H   isosorbide -hydrALAZINE   1 tablet Oral TID   lactulose   30 g Oral BID   levothyroxine   50 mcg Oral Q0600   metoprolol  tartrate  12.5 mg Oral BID   nicotine   14 mg Transdermal Daily   pantoprazole  (PROTONIX ) IV  40 mg Intravenous Q12H   polyethylene glycol  17 g Oral Daily   rosuvastatin   40 mg Oral Daily   senna-docusate  2 tablet Oral BID   sodium chloride  flush  3 mL Intravenous Q12H   Continuous Infusions:  amiodarone  Stopped (10/07/24 0245)   heparin  1,100 Units/hr (10/06/24 2151)   PRN Meds: acetaminophen  **OR** acetaminophen , calcium  carbonate, guaiFENesin -dextromethorphan , ipratropium-albuterol , nicotine  polacrilex, nitroGLYCERIN , ondansetron  **OR** ondansetron  (ZOFRAN ) IV, oxyCODONE , sodium chloride  flush, sodium chloride  flush   Vital Signs    Vitals:   10/07/24 0559 10/07/24 0700 10/07/24 0711 10/07/24 0712  BP: 124/65 129/75    Pulse: 81 85    Resp: 12 17    Temp:      TempSrc:      SpO2: 100% 95% 100% 100%  Weight:      Height:        Intake/Output Summary (Last 24 hours) at 10/07/2024 0823 Last data filed at 10/07/2024 0444 Gross per 24 hour  Intake 764.32 ml   Output 900 ml  Net -135.68 ml   Filed Weights   10/05/24 0500 10/06/24 0409 10/07/24 0500  Weight: 101.8 kg 101.8 kg 103.4 kg    Telemetry    Sinus rhythm- Personally Reviewed  ECG     - Personally Reviewed  Physical Exam   General: Comfortable Head: Atraumatic, normal size  Eyes: PEERLA, EOMI  Neck: Supple, normal JVD Cardiac: Normal S1, S2; RRR; no murmurs, rubs, or gallops Lungs: Clear to auscultation bilaterally Abd: Soft, nontender, no hepatomegaly  Ext: warm, no edema Musculoskeletal: No deformities, BUE and BLE strength normal and equal Skin: Warm and dry, no rashes    Labs    Chemistry Recent Labs  Lab 10/03/24 1142 10/04/24 0425 10/04/24 1651 10/05/24 0023 10/06/24 0117 10/07/24 0608  NA 142 139   < > 138 136 133*  K 5.0 4.7   < > 4.5 4.0 3.9  CL 103 102   < > 98 97* 92*  CO2 22 24   < > 23 27 23   GLUCOSE 210* 315*   < > 299* 313* 167*  BUN 98* 99*   < > 97* 85* 80*  CREATININE 2.46* 2.45*   < > 2.41* 2.02* 2.47*  CALCIUM  8.4* 8.1*   < > 8.1* 8.0* 7.9*  PROT 6.2* 6.6  --   --   --   --  ALBUMIN  3.2* 3.4*  --   --   --   --   AST 17 24  --   --   --   --   ALT 18 20  --   --   --   --   ALKPHOS 94 90  --   --   --   --   BILITOT 0.5 0.6  --   --   --   --   GFRNONAA 22* 22*   < > 22* 28* 22*  ANIONGAP 17* 13   < > 17* 12 18*   < > = values in this interval not displayed.     Hematology Recent Labs  Lab 10/05/24 1032 10/06/24 0117 10/07/24 0608  WBC 17.8* 15.0* 7.7  RBC 2.83* 2.71* 2.39*  HGB 8.3* 7.9* 7.1*  HCT 27.2* 26.2* 22.9*  MCV 96.1 96.7 95.8  MCH 29.3 29.2 29.7  MCHC 30.5 30.2 31.0  RDW 18.8* 18.9* 18.5*  PLT 213 182 175    Cardiac EnzymesNo results for input(s): TROPONINI in the last 168 hours. No results for input(s): TROPIPOC in the last 168 hours.   BNP Recent Labs  Lab 10/03/24 1142 10/04/24 0000  BNP 386.8* 405.1*     DDimer No results for input(s): DDIMER in the last 168 hours.   Radiology    DG  Abd 1 View Result Date: 10/06/2024 EXAM: 1 VIEW XRAY OF THE ABDOMEN 10/06/2024 07:01:31 AM COMPARISON: CT abdomen and pelvis 04/18/2022. CLINICAL HISTORY: 63 year old female with abdominal pain. FINDINGS: BOWEL: Moderate colonic stool burden. Nonobstructive bowel gas pattern. SOFT TISSUES: Vascular calcifications. No opaque urinary calculi. BONES: No acute osseous abnormality. IMPRESSION: 1. Nonobstructive bowel gas pattern with moderate colonic stool burden. Electronically signed by: Helayne Hurst MD 10/06/2024 07:19 AM EST RP Workstation: [id number]    Cardiac Studies   Echo   Patient Profile     63 y.o. female with severe PAD, carotid artery stenosis status post left CEA, coronary artery disease, chronic systolic heart failure, paroxysmal atrial fibrillation on anticoagulation with apixaban , cirrhosis, hypothyroidism, chronic kidney disease, type 2 diabetes, OSA not on CPAP presented with precordial chest pain and elevated troponin  Assessment & Plan    NSTEMI Precordial chest pain Depressed ejection fraction EF 30 to 35% with Left ventricular wall motion abnormalities Dilated cardiomyopathy Melanotic stool/anemia  Thankfully she is not experiencing any anginal symptoms at this time.  Troponin elevated peaked at 470, with depressed EF and wall motion abnormalities.  She had been started on heparin  drip.  Prior discussion with current melanotic stool on the heparin  and hemoglobin now is dropping.  This is a very difficult situation given her anemia with proceeding with a heart catheterization.  She needs blood transfusion at this point.  She has been seen by GI recently.  As long as she is tolerating medical therapy I would strongly suggest we hold off on heart catheterization as there again is another issue with her stage IV chronic kidney disease and could progress to end-stage setting of contrast-induced nephropathy leading to hemodialysis.  Would appreciate GI input on restarting her  Eliquis  5 mg twice daily for her atrial fibrillation..  Given that hemoglobin continues to drop on the heparin  drip she is today 7.1.  She is currently on Protonix  40 mg twice daily.  She will also be set up for outpatient workup for watchman as well.   Continue her rosuvastatin  40 mg daily.  In terms of GDMT in the setting of her  depressed ejection fraction she is on metoprolol  to tartrate which we can transition to succinate prior to discharge, she is on BiDil .  Kidney function is a limiting factor in adjusting for other medications.  Smoking cessation advised.     For questions or updates, please contact [facility] Please consult [email or web address] for contact info under Cardiology/STEMI.      Signed, Shiah Berhow, DO  10/07/2024, 8:23 AM

## 2024-10-07 NOTE — Progress Notes (Signed)
 PROGRESS NOTE    [name]  FMW:[id number] DOB: 1961-04-12 DOA: 10/03/2024 PCP: Campbell Reynolds, NP  Chronically ill 63/F with heavy tobacco use, COPD, paroxysmal A-fib, chronic systolic CHF, CAD, PAD, CKD 4, gout, hypothyroidism, recurrent GI bleed presented to the ED with volume overload as well as anemia, melena -GI and cardiology following, no plan for repeat endoscopic eval at this time - Ischemic workup limited, existing CKD - 11/23 overnight, became unstable with A-fib RVR, heart rate in 170s, cardioverted - 11/24, hemoglobin down to 7.1, transfusing, also orthostatic, holding Bidil   Subjective: -Afib RVR, cardioverted yesterday, no bleeding yesterday , breathing improving - Became orthostatic  Assessment and Plan:  Upper GI bleed with melena. Presented with epigastric pain and 2 episodes of black tarry stool. Similar presentation in May 2025 at which time EGD and colonoscopy did not show any evidence of active bleeding. [facility] was consulted. Currently no plans for endoscopic evaluation given her tenuous cardiopulmonary status  Recommend capsule endoscopy if she rebleeds or has a large drop in hemoglobin. Hb down to 7.1. will transfuse 1 unit PRBC today Change PPI to p.o. -Eliquis  on hold, now on heparin    Acute on chronic HFrEF. Severe mitral valve regurgitation. -Last echo with EF 30-35%, grade 1 DD, normal RV, moderate to severe mitral regurgitation -Improving with diuresis, remains volume overloaded  - Continue Lasix  80 mg twice daily, Toprol , hold BiDil  today with orthostatic symptoms - GDMT limited by CKD 4 - Cards following   Elevated troponin. History of CAD. Atypical left-sided chest pain. -Troponin peaked to 377, now improving, felt to be demand ischemia, has chronic LBBB -Cards following, no plan for ischemic workup with CKD 4 -Continue statin, IV heparin    COPD - Clinically do not suspect COPD exacerbation at this time, symptoms primarily from  volume overload -DC antibiotics and steroids  Abnormal urinalysis -Urine cultures are negative, DC ABX    Paroxysmal atrial fibrillation Currently in sinus rhythm Holding Toprol -XL. Holding Eliquis  in the setting of GI bleed. Given her hemoglobin is stable, recommended to resume anticoagulation with heparin  without a bolus on 11/23 if remains stable.  Type 2 diabetes mellitus -CBGs uncontrolled, continue glargine, and add meal coverage   Hyperlipidemia Continue Crestor    Hypothyroidism Continue Synthroid .   History of CAD and PAD Continue Crestor  and Toprol -XL. Holding antiplatelet therapy in the setting of GI bleed.   History of gout Holding allopurinol  in the setting of advanced CKD.   History of chronic tobacco smoking Still smokes 1 pack a day.  Continue nicotine  patch.  Counseled patient for smoking cessation   CKD stage IV Baseline serum creatinine appears to be around 2-2.7. On presentation serum creatinine 2.46. Monitor while receiving diuresis.Generalized anxiety disorder   Depression. Acute public encephalopathy. Metabolic workup appears to be unremarkable. Holding Wellbutrin  XL     DVT prophylaxis: IV heparin  Code Status: DNR, d/w pt today Family Communication: none present Disposition Plan: SNF  Consultants:    Procedures:   Antimicrobials:    Objective: Vitals:   10/07/24 0559 10/07/24 0700 10/07/24 0711 10/07/24 0712  BP: 124/65 129/75    Pulse: 81 85    Resp: 12 17    Temp:      TempSrc:      SpO2: 100% 95% 100% 100%  Weight:      Height:        Intake/Output Summary (Last 24 hours) at 10/07/2024 0806 Last data filed at 10/07/2024 0444 Gross per 24 hour  Intake 1004.32  ml  Output 900 ml  Net 104.32 ml   Filed Weights   10/05/24 0500 10/06/24 0409 10/07/24 0500  Weight: 101.8 kg 101.8 kg 103.4 kg    Examination:  General exam: Appears calm and comfortable  HEENT: + JVD Respiratory system: poor air movt, decreased at  bases Cardiovascular system: S1 & S2 heard, RRR.  Abd: nondistended, soft and nontender.Normal bowel sounds heard. Central nervous system: Alert and oriented. No focal neurological deficits. Extremities: 2plus edema Skin: No rashes Psychiatry:  Mood & affect appropriate.     Data Reviewed:   CBC: Recent Labs  Lab 10/04/24 1651 10/05/24 0023 10/05/24 1032 10/06/24 0117 10/07/24 0608  WBC 11.1* 14.1* 17.8* 15.0* 7.7  NEUTROABS  --   --   --  13.8*  --   HGB 8.3* 8.1* 8.3* 7.9* 7.1*  HCT 27.4* 26.9* 27.2* 26.2* 22.9*  MCV 97.2 97.8 96.1 96.7 95.8  PLT 197 221 213 182 175   Basic Metabolic Panel: Recent Labs  Lab 10/04/24 0425 10/04/24 1651 10/05/24 0023 10/06/24 0117 10/07/24 0608  NA 139 139 138 136 133*  K 4.7 4.6 4.5 4.0 3.9  CL 102 100 98 97* 92*  CO2 24 24 23 27 23   GLUCOSE 315* 311* 299* 313* 167*  BUN 99* 92* 97* 85* 80*  CREATININE 2.45* 2.36* 2.41* 2.02* 2.47*  CALCIUM  8.1* 8.3* 8.1* 8.0* 7.9*  MG  --   --  2.5* 2.4 2.4   GFR: Estimated Creatinine Clearance: 27.5 mL/min (A) (by C-G formula based on SCr of 2.47 mg/dL (H)). Liver Function Tests: Recent Labs  Lab 10/03/24 1142 10/04/24 0425  AST 17 24  ALT 18 20  ALKPHOS 94 90  BILITOT 0.5 0.6  PROT 6.2* 6.6  ALBUMIN  3.2* 3.4*   Recent Labs  Lab 10/03/24 1142  LIPASE 34   Recent Labs  Lab 10/04/24 1148  AMMONIA 17   Coagulation Profile: Recent Labs  Lab 10/04/24 1148  INR 1.2   Cardiac Enzymes: No results for input(s): CKTOTAL, CKMB, CKMBINDEX, TROPONINI in the last 168 hours. BNP (last 3 results) No results for input(s): PROBNP in the last 8760 hours. HbA1C: No results for input(s): HGBA1C in the last 72 hours. CBG: Recent Labs  Lab 10/06/24 0628 10/06/24 1121 10/06/24 1618 10/06/24 2126 10/07/24 0553  GLUCAP 228* 267* 280* 309* 222*   Lipid Profile: No results for input(s): CHOL, HDL, LDLCALC, TRIG, CHOLHDL, LDLDIRECT in the last 72  hours. Thyroid  Function Tests: Recent Labs    10/04/24 1148  TSH 1.580  FREET4 1.12   Anemia Panel: Recent Labs    10/04/24 1148  VITAMINB12 402  FOLATE 14.6  TIBC 311  IRON  49  RETICCTPCT 4.2*   Urine analysis:    Component Value Date/Time   COLORURINE YELLOW 10/03/2024 2033   APPEARANCEUR HAZY (A) 10/03/2024 2033   LABSPEC 1.011 10/03/2024 2033   PHURINE 5.0 10/03/2024 2033   GLUCOSEU NEGATIVE 10/03/2024 2033   HGBUR NEGATIVE 10/03/2024 2033   BILIRUBINUR NEGATIVE 10/03/2024 2033   KETONESUR NEGATIVE 10/03/2024 2033   PROTEINUR NEGATIVE 10/03/2024 2033   UROBILINOGEN 0.2 09/05/2015 0012   NITRITE POSITIVE (A) 10/03/2024 2033   LEUKOCYTESUR LARGE (A) 10/03/2024 2033   Sepsis Labs: @LABRCNTIP (procalcitonin:4,lacticidven:4)  ) Recent Results (from the past 240 hours)  Respiratory (~20 pathogens) panel by PCR     Status: None   Collection Time: 10/04/24 12:00 AM   Specimen: Nasopharyngeal Swab; Respiratory  Result Value Ref Range Status   Adenovirus NOT  DETECTED NOT DETECTED Final   Coronavirus 229E NOT DETECTED NOT DETECTED Final    Comment: (NOTE) The Coronavirus on the Respiratory Panel, DOES NOT test for the novel  Coronavirus (2019 nCoV)    Coronavirus HKU1 NOT DETECTED NOT DETECTED Final   Coronavirus NL63 NOT DETECTED NOT DETECTED Final   Coronavirus OC43 NOT DETECTED NOT DETECTED Final   Metapneumovirus NOT DETECTED NOT DETECTED Final   Rhinovirus / Enterovirus NOT DETECTED NOT DETECTED Final   Influenza A NOT DETECTED NOT DETECTED Final   Influenza B NOT DETECTED NOT DETECTED Final   Parainfluenza Virus 1 NOT DETECTED NOT DETECTED Final   Parainfluenza Virus 2 NOT DETECTED NOT DETECTED Final   Parainfluenza Virus 3 NOT DETECTED NOT DETECTED Final   Parainfluenza Virus 4 NOT DETECTED NOT DETECTED Final   Respiratory Syncytial Virus NOT DETECTED NOT DETECTED Final   Bordetella pertussis NOT DETECTED NOT DETECTED Final   Bordetella Parapertussis NOT  DETECTED NOT DETECTED Final   Chlamydophila pneumoniae NOT DETECTED NOT DETECTED Final   Mycoplasma pneumoniae NOT DETECTED NOT DETECTED Final    Comment: Performed at [facility], [address] [place], [place] [address]  Urine Culture (for pregnant, neutropenic or urologic patients or patients with an indwelling urinary catheter)     Status: Abnormal   Collection Time: 10/04/24  6:40 AM   Specimen: Urine, Clean Catch  Result Value Ref Range Status   Specimen Description URINE, CLEAN CATCH  Final   Special Requests Normal  Final   Culture (A)  Final    <10,000 COLONIES/mL INSIGNIFICANT GROWTH Performed at [facility], [address], [place], [place] [address]    Report Status 10/05/2024 FINAL  Final  Resp panel by RT-PCR (RSV, Flu A&B, Covid) Anterior Nasal Swab     Status: None   Collection Time: 10/04/24 10:55 AM   Specimen: Anterior Nasal Swab  Result Value Ref Range Status   SARS Coronavirus 2 by RT PCR NEGATIVE NEGATIVE Final   Influenza A by PCR NEGATIVE NEGATIVE Final   Influenza B by PCR NEGATIVE NEGATIVE Final    Comment: (NOTE) The Xpert Xpress SARS-CoV-2/FLU/RSV plus assay is intended as an aid in the diagnosis of influenza from Nasopharyngeal swab specimens and should not be used as a sole basis for treatment. Nasal washings and aspirates are unacceptable for Xpert Xpress SARS-CoV-2/FLU/RSV testing.  Fact Sheet for Patients: bloggercourse.com  Fact Sheet for Healthcare Providers: seriousbroker.it  This test is not yet approved or cleared by the [place]  FDA and has been authorized for detection and/or diagnosis of SARS-CoV-2 by FDA under an Emergency Use Authorization (EUA). This EUA will remain in effect (meaning this test can be used) for the duration of the COVID-19 declaration under Section 564(b)(1) of the Act, 21 U.S.C. section 360bbb-3(b)(1), unless the authorization is terminated  or revoked.     Resp Syncytial Virus by PCR NEGATIVE NEGATIVE Final    Comment: (NOTE) Fact Sheet for Patients: bloggercourse.com  Fact Sheet for Healthcare Providers: seriousbroker.it  This test is not yet approved or cleared by the [place]  FDA and has been authorized for detection and/or diagnosis of SARS-CoV-2 by FDA under an Emergency Use Authorization (EUA). This EUA will remain in effect (meaning this test can be used) for the duration of the COVID-19 declaration under Section 564(b)(1) of the Act, 21 U.S.C. section 360bbb-3(b)(1), unless the authorization is terminated or revoked.  Performed at [facility], [address], [place], [place]  [address]      Radiology Studies: DG Abd 1 View Result Date: 10/06/2024 EXAM: 1 VIEW XRAY OF THE ABDOMEN 10/06/2024 07:01:31 AM COMPARISON: CT abdomen and pelvis 04/18/2022. CLINICAL HISTORY: 63 year old female with abdominal pain. FINDINGS: BOWEL: Moderate colonic stool burden. Nonobstructive bowel gas pattern. SOFT TISSUES: Vascular calcifications. No opaque urinary calculi. BONES: No acute osseous abnormality. IMPRESSION: 1. Nonobstructive bowel gas pattern with moderate colonic stool burden. Electronically signed by: Helayne Hurst MD 10/06/2024 07:19 AM EST RP Workstation: [id number]     Scheduled Meds:  sodium chloride    Intravenous Once   budesonide  (PULMICORT ) nebulizer solution  0.25 mg Nebulization BID   fluticasone   1 spray Each Nare Daily   furosemide   80 mg Intravenous BID   insulin  aspart  0-15 Units Subcutaneous TID WC   insulin  aspart  0-5 Units Subcutaneous QHS   insulin  aspart  5 Units Subcutaneous TID WC   insulin  glargine-yfgn  50 Units Subcutaneous Daily   ipratropium-albuterol   3 mL Nebulization Q6H   isosorbide -hydrALAZINE   1 tablet Oral TID   lactulose   30 g Oral BID   levothyroxine   50 mcg Oral Q0600   metoprolol  tartrate  12.5 mg Oral BID    nicotine   14 mg Transdermal Daily   pantoprazole  (PROTONIX ) IV  40 mg Intravenous Q12H   polyethylene glycol  17 g Oral Daily   rosuvastatin   40 mg Oral Daily   senna-docusate  2 tablet Oral BID   sodium chloride  flush  3 mL Intravenous Q12H   Continuous Infusions:  amiodarone  Stopped (10/07/24 0245)   heparin  1,100 Units/hr (10/06/24 2151)     LOS: 4 days    Time spent: 32    Sigurd Pac, MD [facility]   10/07/2024, 8:06 AM

## 2024-10-07 NOTE — Plan of Care (Signed)
   Problem: Education: Goal: Knowledge of General Education information will improve Description: Including pain rating scale, medication(s)/side effects and non-pharmacologic comfort measures Outcome: Progressing   Problem: Health Behavior/Discharge Planning: Goal: Ability to manage health-related needs will improve Outcome: Progressing   Problem: Clinical Measurements: Goal: Cardiovascular complication will be avoided Outcome: Progressing

## 2024-10-07 NOTE — Progress Notes (Signed)
 Upon arrival to the floor at shift change, pt HR was going up into the 170's, 10/10 chest pain with AM shift giving 10mg  metoprolol  via IV. IV consult was added due to poor vasculature, and a 2nd PIV was established. This RN contacted cardiology on call and TRH - received a one time order for metoprolol  5mg  via IV.   Pt was unresponsive to metoprolol  IV. Per cardiology, amio was started and the bolus dose was given. Pt started vomiting, not responsive to zofran . Per cardiology MD, amio was stopped. Amio was attempted again, unsuccessfully - BP down into 80's/60's.   RR Alm RN was called to the bedside and cardiologist Dr. Debarah MD explained procedure to the pt. This RN obtained consent, which was placed into pt's chart. After bedside cardioversion, pt's chest pain, nausea, vomiting was relieved.   Pt unable to tolerate TED hose, and it was removed. A different size was ordered, but pt hesitant to replace - states she keeps them off at night at home. Will attempt to place TED hose on again.

## 2024-10-07 NOTE — Progress Notes (Signed)
 Every time pt eats she vomits MD made aware.

## 2024-10-07 NOTE — Progress Notes (Signed)
 PT Cancellation Note  Patient Details Name: [name] MRN: [id number] DOB: 02/17/1961   Cancelled Treatment:    Reason Eval/Treat Not Completed: Medical issues which prohibited therapy. Per RN hold until pt is more stable. Plan to follow up when appropriate.    Leontine Hilt DPT Acute Rehab Services [phone] Prefer contact via chat   Leontine KATHEE Hilt 10/07/2024, 12:52 PM

## 2024-10-07 NOTE — Evaluation (Signed)
 Occupational Therapy Evaluation Patient Details Name: [name] MRN: [id number] DOB: 1960-11-26 Today's Date: 10/07/2024   History of Present Illness   Pt is a 63 y.o. female who presented 10/03/24 due to abdominal pain, blood in stool, gastroesophageal reflux and cough with SOB.  Chest xray showed  mild cardiomegaly, pulmonary vascular congestion and bibasilar atelectasis. UA showed evidence of UTI. 11/23 cardioversion. PMH: GI bleed, CAD, HFrEF, CAD, CKD stage IV, carotid stenosis s/p endarterectomy, paroxysmal AFib, PAD, COPD, GERD, smoker, chronic bradycardia, prior substance abuse, gout, HTN, DM type II, HLD, and vitamin D  deficiency     Clinical Impressions Pt reported at PLOF they live with sister in an apartment with a level entrance. Pt typically mainly at [facility] level but will walk into the bathroom with RW. Pt's sister can assist with set up and IADLS but can not physically assist the pt. At this time pt presented sitting at EOB and wanted to transfer to [facility] with CGA and then completed toileting and required max assist for toileting post BM. Pt then with nursing in room step transferred to bed with RW and then onto scale with CGA. Nursing staff attempting to get a standing BP but then requesting to sit down. Pt started to then vomit and was placed back into supine. Acute Occupational Therapy to follow at this time.      If plan is discharge home, recommend the following:   A little help with walking and/or transfers;A little help with bathing/dressing/bathroom     Functional Status Assessment   Patient has had a recent decline in their functional status and demonstrates the ability to make significant improvements in function in a reasonable and predictable amount of time.     Equipment Recommendations   BSC/3in1     Recommendations for Other Services   Rehab consult     Precautions/Restrictions   Precautions Precautions: Fall Recall of  Precautions/Restrictions: Intact Precaution/Restrictions Comments: watch HR, BP, o2 Restrictions Weight Bearing Restrictions Per Provider Order: No     Mobility Bed Mobility Overal bed mobility: Needs Assistance Bed Mobility: Sit to Supine       Sit to supine: Min assist   General bed mobility comments: pt presented st EOB    Transfers Overall transfer level: Needs assistance Equipment used: Rolling walker (2 wheels) Transfers: Sit to/from Stand Sit to Stand: Contact guard assist           General transfer comment: decrease standing tolerance      Balance Overall balance assessment: Needs assistance Sitting-balance support: Feet supported Sitting balance-Leahy Scale: Fair Sitting balance - Comments: easily fatguied and starting to lean to R side Postural control: Right lateral lean Standing balance support: Bilateral upper extremity supported Standing balance-Leahy Scale: Poor Standing balance comment: only step pivoted fair-poor                           ADL either performed or assessed with clinical judgement   ADL Overall ADL's : Needs assistance/impaired Eating/Feeding: Independent;Sitting   Grooming: Wash/dry hands;Set up;Sitting   Upper Body Bathing: Set up;Sitting   Lower Body Bathing: Moderate assistance   Upper Body Dressing : Set up;Sitting   Lower Body Dressing: Moderate assistance;Sit to/from stand   Toilet Transfer: Contact guard assist   Toileting- Clothing Manipulation and Hygiene: Contact guard assist       Functional mobility during ADLs: Contact guard assist;Rolling walker (2 wheels);Cueing for sequencing;Cueing for safety  Vision         Perception         Praxis         Pertinent Vitals/Pain Pain Assessment Pain Assessment: Faces Faces Pain Scale: Hurts a little bit Pain Location: R shoulder, BLE knees Pain Descriptors / Indicators: Aching Pain Intervention(s): Limited activity within patient's  tolerance, Monitored during session, Repositioned     Extremity/Trunk Assessment Upper Extremity Assessment Upper Extremity Assessment: RUE deficits/detail RUE Deficits / Details: Pt reporting bone on bone shoulder and limited at times due to pain RUE Sensation: WNL RUE Coordination: decreased gross motor   Lower Extremity Assessment Lower Extremity Assessment: Defer to PT evaluation       Communication Communication Communication: No apparent difficulties   Cognition Arousal: Alert Behavior During Therapy: WFL for tasks assessed/performed Cognition: No apparent impairments                               Following commands: Intact       Cueing  General Comments   Cueing Techniques: Verbal cues      Exercises     Shoulder Instructions      Home Living Family/patient expects to be discharged to:: Private residence Living Arrangements: Other relatives (sister) Available Help at Discharge: Family;Available 24 hours/day Type of Home: Apartment Home Access: Level entry     Home Layout: One level     Bathroom Shower/Tub: Chief Strategy Officer: Handicapped height Bathroom Accessibility: No   Home Equipment: Shower seat;Cane - quad;Wheelchair - manual;Other (comment) (tub lift)          Prior Functioning/Environment Prior Level of Function : Needs assist             Mobility Comments: pivots to w/c and able to walk 15 feet to and from bathroom ADLs Comments: Pt Mod I with ADLs and light meal prep. Sister assists with all other IADLs. Pt's sister can not lift pt though.    OT Problem List:     OT Treatment/Interventions:        OT Goals(Current goals can be found in the care plan section)   Acute Rehab OT Goals Patient Stated Goal: to get better OT Goal Formulation: With patient Time For Goal Achievement: 10/21/24 Potential to Achieve Goals: Fair   OT Frequency:  Min 2X/week    Co-evaluation               AM-PAC OT 6 Clicks Daily Activity     Outcome Measure                 End of Session Equipment Utilized During Treatment: Gait belt;Rolling walker (2 wheels) Nurse Communication: Mobility status  Activity Tolerance: Other (comment) (pt started to vomit at the end of session) Patient left: in bed;with call bell/phone within reach;with bed alarm set  OT Visit Diagnosis: Unsteadiness on feet (R26.81);Other abnormalities of gait and mobility (R26.89);Muscle weakness (generalized) (M62.81);Pain Pain - Right/Left: Right Pain - part of body: Shoulder                Time: 9257-9163 OT Time Calculation (min): 54 min Charges:  OT General Charges $OT Visit: 1 Visit OT Evaluation $OT Eval Low Complexity: 1 Low OT Treatments $Self Care/Home Management : 38-52 mins  Warrick POUR OTR/L  Acute Rehab Services  [phone] office number   Warrick Berber 10/07/2024, 8:55 AM

## 2024-10-08 DIAGNOSIS — Z515 Encounter for palliative care: Secondary | ICD-10-CM | POA: Diagnosis not present

## 2024-10-08 DIAGNOSIS — I480 Paroxysmal atrial fibrillation: Secondary | ICD-10-CM | POA: Diagnosis not present

## 2024-10-08 DIAGNOSIS — R072 Precordial pain: Secondary | ICD-10-CM | POA: Diagnosis not present

## 2024-10-08 DIAGNOSIS — R7989 Other specified abnormal findings of blood chemistry: Secondary | ICD-10-CM | POA: Diagnosis not present

## 2024-10-08 DIAGNOSIS — I429 Cardiomyopathy, unspecified: Secondary | ICD-10-CM | POA: Diagnosis not present

## 2024-10-08 DIAGNOSIS — Z7189 Other specified counseling: Secondary | ICD-10-CM | POA: Diagnosis not present

## 2024-10-08 DIAGNOSIS — I5021 Acute systolic (congestive) heart failure: Secondary | ICD-10-CM | POA: Diagnosis not present

## 2024-10-08 LAB — BASIC METABOLIC PANEL WITH GFR
Anion gap: 12 (ref 5–15)
BUN: 71 mg/dL — ABNORMAL HIGH (ref 8–23)
CO2: 27 mmol/L (ref 22–32)
Calcium: 8 mg/dL — ABNORMAL LOW (ref 8.9–10.3)
Chloride: 98 mmol/L (ref 98–111)
Creatinine, Ser: 2.35 mg/dL — ABNORMAL HIGH (ref 0.44–1.00)
GFR, Estimated: 23 mL/min — ABNORMAL LOW (ref >60)
Glucose, Bld: 143 mg/dL — ABNORMAL HIGH (ref 70–99)
Potassium: 3.9 mmol/L (ref 3.5–5.1)
Sodium: 137 mmol/L (ref 135–145)

## 2024-10-08 LAB — GLUCOSE, CAPILLARY
Glucose-Capillary: 158 mg/dL — ABNORMAL HIGH (ref 70–99)
Glucose-Capillary: 197 mg/dL — ABNORMAL HIGH (ref 70–99)
Glucose-Capillary: 220 mg/dL — ABNORMAL HIGH (ref 70–99)
Glucose-Capillary: 152 mg/dL — ABNORMAL HIGH (ref 70–99)

## 2024-10-08 LAB — BPAM RBC
Blood Product Expiration Date: 202512052359
ISSUE DATE / TIME: 202511241739
Unit Type and Rh: 202512052359
Unit Type and Rh: 6200

## 2024-10-08 LAB — TYPE AND SCREEN
ABO/RH(D): A POS
Antibody Screen: NEGATIVE
Unit division: 0

## 2024-10-08 LAB — HEPARIN LEVEL (UNFRACTIONATED): Heparin Unfractionated: 0.51 [IU]/mL (ref 0.30–0.70)

## 2024-10-08 LAB — CBC
HCT: 26.7 % — ABNORMAL LOW (ref 36.0–46.0)
Hemoglobin: 8.4 g/dL — ABNORMAL LOW (ref 12.0–15.0)
MCH: 29.1 pg (ref 26.0–34.0)
MCHC: 31.5 g/dL (ref 30.0–36.0)
MCV: 92.4 fL (ref 80.0–100.0)
Platelets: 184 K/uL (ref 150–400)
RBC: 2.89 MIL/uL — ABNORMAL LOW (ref 3.87–5.11)
RDW: 19.9 % — ABNORMAL HIGH (ref 11.5–15.5)
WBC: 8.1 K/uL (ref 4.0–10.5)
nRBC: 0 % (ref 0.0–0.2)

## 2024-10-08 LAB — MAGNESIUM: Magnesium: 2.4 mg/dL (ref 1.7–2.4)

## 2024-10-08 LAB — APTT: aPTT: 59 s — ABNORMAL HIGH (ref 24–36)

## 2024-10-08 NOTE — Progress Notes (Signed)
 PHARMACY - ANTICOAGULATION CONSULT NOTE  Pharmacy Consult for IV heparin  Indication: atrial fibrillation  Allergies  Allergen Reactions   Neurontin  [Gabapentin ] Nausea And Vomiting and Other (See Comments)    POSSIBLE SHAKING   Lyrica  [Pregabalin ] Other (See Comments)    Shaking     Patient Measurements: Height: 5' 3 (160 cm) Weight: 104 kg (229 lb 4.5 oz) IBW/kg (Calculated) : 52.4 HEPARIN  DW (KG): 76.4  Vital Signs: Temp: 98.7 F (37.1 C) (11/25 0737) Temp Source: Oral (11/25 0737) BP: 150/73 (11/25 0737) Pulse Rate: 87 (11/25 0737)  Labs: Recent Labs    10/05/24 1503 10/06/24 0117 10/06/24 0117 10/06/24 2047 10/06/24 2237 10/07/24 0608 10/07/24 1735 10/07/24 2251 10/08/24 0218  HGB  --  7.9*  --   --   --  7.1*  --  8.7* 8.4*  HCT  --  26.2*  --   --   --  22.9*  --  27.6* 26.7*  PLT  --  182  --   --   --  175  --   --  184  APTT  --   --    < > 34  --  57* 52*  --  59*  HEPARINUNFRC  --   --   --  0.76*  --  0.74*  --   --  0.51  CREATININE  --  2.02*  --   --   --  2.47*  --   --  2.35*  TROPONINIHS 327*  --   --  361* 470*  --   --   --   --    < > = values in this interval not displayed.    Estimated Creatinine Clearance: 29 mL/min (A) (by C-G formula based on SCr of 2.35 mg/dL (H)).   Medical History: Past Medical History:  Diagnosis Date   Acute renal failure    Acute respiratory failure with hypoxia ([name]) 12/02/2019   Alcohol abuse    Alcoholic cirrhosis ([name])    Anemia    Anxiety    Arthritis    knees (11/26/2018)   B12 deficiency    CAD (coronary artery disease)    a. 11/10/2014 Cath: LM nl, LAD min irregs, D1 30 ost, D2 50d, LCX 87m, OM1 80 p/m (1.5 mm vessel), OM2 16m, RCA nondom 62m-->med rx.. Demand ischemia in the setting of rapid a-fib.   Cardiomyopathy ([name])    Carotid artery disease    a. 01/2015 Carotid Angio: RICA 100, LICA 95p; b. 02/2015 s/p L CEA; c. 05/2019 Carotid U/S: RICA 100. RECA >50. LICA 1-39%.   Cellulitis     lower extremities   Cellulitis in diabetic foot ([name]) 07/08/2019   CHF (congestive heart failure) ([name])    Chronic combined systolic and diastolic CHF (congestive heart failure) ([name])    a. 10/2014 Echo: EF 40-45%; b. 10/2018 Echo: EF 45-50%, gr2 DD; c. 11/2019 Echo: EF 50%, mild LVH, gr2 DD (restrictive), antlat HK, Nl RV fxn. Mild BAE. RVSP 59.48mmHg.   CKD (chronic kidney disease), stage III ([name])    Cocaine abuse ([name])    COPD (chronic obstructive pulmonary disease) ([name])    Critical ischemia of foot ([name]) 06/07/2021   Critical limb ischemia     Critical lower limb ischemia ([name])    Demand ischemia ([name]) 10/29/2014   Depression    Diabetes mellitus without complication ([name])    Diabetic peripheral neuropathy ([name])    DVT (deep venous thrombosis) ([name])    Dyspnea  Elevated troponin    a. Chronic elevation.   Fall 05/05/2021   Femoro-popliteal artery disease    Peripheral arterial disease/critical limb ischemia     GERD (gastroesophageal reflux disease)    Hyperlipemia    Hypertension    Hypokalemia    Hypomagnesemia    Hypothyroidism    Marijuana abuse    Narcotic abuse ([name])    Noncompliance    NSVT (nonsustained ventricular tachycardia) ([name])    Obesity    Osteomyelitis ([name]) 06/21/2019   PAF (paroxysmal atrial fibrillation) ([name])    Paroxysmal atrial tachycardia    Peripheral arterial disease    a. 01/2015 Angio/PTA: RSFA 99 (atherectomy/pta) - 1 vessel runoff via diff dzs peroneal; b. 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   Pneumonia    once or twice (11/26/2018)   Poorly controlled type 2 diabetes mellitus ([name])    Renal disorder    Renal insufficiency    a. 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   SIRS (systemic inflammatory response syndrome) ([name]) 04/06/2017   Sleep apnea    couldn't handle wearing the mask (11/26/2018)   Symptomatic bradycardia    a. Avoid AV blocking agent per EP. Prev req temp wire in 2017.   Tobacco abuse    Wrist fracture,  left, closed, initial encounter 01/29/2015    Medications:  Infusions:   amiodarone  Stopped (10/07/24 0245)   heparin  1,350 Units/hr (10/08/24 9348)    Assessment: 63 yo female on chronic Eliquis  for afib, currently on hold for melena / GI bleed.  Pharmacy asked to start IV heparin  with no bolus.  Hgb trending down 7.9 > 7.1 with orders for PRBC x 1 11/24.  Of note, patient went into afib overnight 11/23, did not tolerate amiodarone , and required DCCV.  Pharmacy aiming for low end of heparin  level / aPTT goal.  Currently monitoring both levels with recent apixaban  use (last dose 11/20 AM).  Heparin  level remains falsely elevated.  aPTT slightly below goal on 1350 units/hr.  Goal of Therapy:  Heparin  level 0.3-0.5 aPTT 66-85 sec Monitor platelets by anticoagulation protocol: Yes   Plan:  Increase IV heparin  to 1450 units/hr. Daily aPTT, heparin  level, and CBC.  [employer], Pharm.D., BCPS Clinical Pharmacist Clinical phone for 10/08/2024 from 7:30-3:00 is [phone].  **Pharmacist phone directory can be found on [email or web address] listed under [facility].  10/08/2024 9:10 AM

## 2024-10-08 NOTE — Progress Notes (Addendum)
 PROGRESS NOTE    [name]  FMW:[id number] DOB: 07-Dec-1960 DOA: 10/03/2024 PCP: Campbell Reynolds, NP  Chronically ill 62/F with heavy tobacco use, COPD, paroxysmal A-fib, chronic systolic CHF, CAD, PAD, CKD 4, gout, hypothyroidism, recurrent GI bleed presented to the ED with volume overload as well as anemia, melena -GI and cardiology following, no plan for repeat endoscopic eval at this time - Ischemic workup limited, existing CKD - 11/23 overnight, became unstable with A-fib RVR, heart rate in 170s, cardioverted - 11/24, hemoglobin down to 7.1, transfusing, also orthostatic, holding Bidil  - 11/25, discussed with GI, poor candidate for deep enteroscopy, no plans for VCE  Subjective: - Black stool yesterday, dark stool overnight  Assessment and Plan:  Upper GI bleed with melena. Acute on chronic anemia Presented with epigastric pain and 2 episodes of black tarry stool. Similar presentation in May 2025 at which time EGD and colonoscopy did not show any evidence of active bleeding. [facility] was consulted Hemoglobin down to 7.1 yesterday, transfused 1 unit PRBC, continue PPI -Discussed with GI this morning she is felt to be a poor candidate for deep enteroscopy, hence no plan to pursue VCE  -Eliquis  on hold, now on heparin  -Discussed discontinuing anticoagulation with the patient altogether, not without risk of CVA considering A-fib and extensive risk factors   Acute on chronic HFrEF. Severe mitral valve regurgitation. -Last echo with EF 30-35%, grade 1 DD, normal RV, moderate to severe mitral regurgitation -Improving with diuresis, remains volume overloaded  - Continue Lasix  80 mg twice daily 1 more day, BiDil  held due to orthostatic hypotension, could retry low-dose and monitor - GDMT limited by CKD 4 - Cards following   Elevated troponin. History of CAD. Atypical left-sided chest pain. -Troponin peaked to 377, now improving, felt to be demand ischemia, has chronic  LBBB -Cards following, no plan for ischemic workup with CKD 4 -Continue statin, IV heparin    COPD - Clinically do not suspect COPD exacerbation at this time, symptoms primarily from volume overload -DC antibiotics and steroids  Abnormal urinalysis -Urine cultures are negative, DC ABX    Paroxysmal atrial fibrillation Currently in sinus rhythm Holding Toprol -XL.,?  DC Amio gtt. Holding Eliquis  in the setting of GI bleed. -now on heparin   Type 2 diabetes mellitus -CBGs uncontrolled, continue glargine, and add meal coverage   Hyperlipidemia Continue Crestor    Hypothyroidism Continue Synthroid .   History of CAD and PAD Continue Crestor  and Toprol -XL. Holding antiplatelet therapy in the setting of GI bleed.   History of gout Holding allopurinol  in the setting of advanced CKD.   History of chronic tobacco smoking Still smokes 1 pack a day.  Continue nicotine  patch.  Counseled patient for smoking cessation   CKD stage IV Baseline serum creatinine appears to be around 2-2.7. On presentation serum creatinine 2.46. Monitor while receiving diuresis.Generalized anxiety disorder   Depression. Acute public encephalopathy. Metabolic workup appears to be unremarkable. Holding Wellbutrin  XL    Ethics: Chronically ill with systolic CHF, advanced COPD, CKD 4, A-fib, recurrent GI bleeds, discussed with patient regarding concern for overall prognosis, palliative care following   DVT prophylaxis: IV heparin  Code Status: DNR,  Family Communication: none present Disposition Plan: SNF  Consultants:    Procedures:   Antimicrobials:    Objective: Vitals:   10/08/24 0713 10/08/24 0735 10/08/24 0737 10/08/24 0800  BP:   (!) 150/73   Pulse:   87   Resp:   18   Temp:   98.7 F (37.1 C)  TempSrc:  Oral Oral   SpO2: 98%  97% 93%  Weight:      Height:        Intake/Output Summary (Last 24 hours) at 10/08/2024 0933 Last data filed at 10/08/2024 0600 Gross per 24 hour   Intake 1229.56 ml  Output 1201 ml  Net 28.56 ml   Filed Weights   10/06/24 0409 10/07/24 0500 10/08/24 0657  Weight: 101.8 kg 103.4 kg 104 kg    Examination:  General exam: Appears calm and comfortable no distress HEENT: + JVD Respiratory system: poor air movt, decreased at bases Cardiovascular system: S1 & S2 heard, RRR.  Abd: nondistended, soft and nontender.Normal bowel sounds heard. Central nervous system: Alert and oriented. No focal neurological deficits. Extremities: 1plus edema Skin: No rashes Psychiatry:  Mood & affect appropriate.     Data Reviewed:   CBC: Recent Labs  Lab 10/05/24 0023 10/05/24 1032 10/06/24 0117 10/07/24 0608 10/07/24 2251 10/08/24 0218  WBC 14.1* 17.8* 15.0* 7.7  --  8.1  NEUTROABS  --   --  13.8*  --   --   --   HGB 8.1* 8.3* 7.9* 7.1* 8.7* 8.4*  HCT 26.9* 27.2* 26.2* 22.9* 27.6* 26.7*  MCV 97.8 96.1 96.7 95.8  --  92.4  PLT 221 213 182 175  --  184   Basic Metabolic Panel: Recent Labs  Lab 10/04/24 1651 10/05/24 0023 10/06/24 0117 10/07/24 0608 10/08/24 0218  NA 139 138 136 133* 137  K 4.6 4.5 4.0 3.9 3.9  CL 100 98 97* 92* 98  CO2 24 23 27 23 27   GLUCOSE 311* 299* 313* 167* 143*  BUN 92* 97* 85* 80* 71*  CREATININE 2.36* 2.41* 2.02* 2.47* 2.35*  CALCIUM  8.3* 8.1* 8.0* 7.9* 8.0*  MG  --  2.5* 2.4 2.4 2.4   GFR: Estimated Creatinine Clearance: 29 mL/min (A) (by C-G formula based on SCr of 2.35 mg/dL (H)). Liver Function Tests: Recent Labs  Lab 10/03/24 1142 10/04/24 0425  AST 17 24  ALT 18 20  ALKPHOS 94 90  BILITOT 0.5 0.6  PROT 6.2* 6.6  ALBUMIN  3.2* 3.4*   Recent Labs  Lab 10/03/24 1142  LIPASE 34   Recent Labs  Lab 10/04/24 1148  AMMONIA 17   Coagulation Profile: Recent Labs  Lab 10/04/24 1148  INR 1.2   Cardiac Enzymes: No results for input(s): CKTOTAL, CKMB, CKMBINDEX, TROPONINI in the last 168 hours. BNP (last 3 results) No results for input(s): PROBNP in the last 8760  hours. HbA1C: No results for input(s): HGBA1C in the last 72 hours. CBG: Recent Labs  Lab 10/07/24 0553 10/07/24 1049 10/07/24 1637 10/07/24 2105 10/08/24 0607  GLUCAP 222* 224* 280* 262* 158*   Lipid Profile: No results for input(s): CHOL, HDL, LDLCALC, TRIG, CHOLHDL, LDLDIRECT in the last 72 hours. Thyroid  Function Tests: No results for input(s): TSH, T4TOTAL, FREET4, T3FREE, THYROIDAB in the last 72 hours.  Anemia Panel: No results for input(s): VITAMINB12, FOLATE, FERRITIN, TIBC, IRON , RETICCTPCT in the last 72 hours.  Urine analysis:    Component Value Date/Time   COLORURINE YELLOW 10/03/2024 2033   APPEARANCEUR HAZY (A) 10/03/2024 2033   LABSPEC 1.011 10/03/2024 2033   PHURINE 5.0 10/03/2024 2033   GLUCOSEU NEGATIVE 10/03/2024 2033   HGBUR NEGATIVE 10/03/2024 2033   BILIRUBINUR NEGATIVE 10/03/2024 2033   KETONESUR NEGATIVE 10/03/2024 2033   PROTEINUR NEGATIVE 10/03/2024 2033   UROBILINOGEN 0.2 09/05/2015 0012   NITRITE POSITIVE (A) 10/03/2024 2033   LEUKOCYTESUR  LARGE (A) 10/03/2024 2033   Sepsis Labs: @LABRCNTIP (procalcitonin:4,lacticidven:4)  ) Recent Results (from the past 240 hours)  Respiratory (~20 pathogens) panel by PCR     Status: None   Collection Time: 10/04/24 12:00 AM   Specimen: Nasopharyngeal Swab; Respiratory  Result Value Ref Range Status   Adenovirus NOT DETECTED NOT DETECTED Final   Coronavirus 229E NOT DETECTED NOT DETECTED Final    Comment: (NOTE) The Coronavirus on the Respiratory Panel, DOES NOT test for the novel  Coronavirus (2019 nCoV)    Coronavirus HKU1 NOT DETECTED NOT DETECTED Final   Coronavirus NL63 NOT DETECTED NOT DETECTED Final   Coronavirus OC43 NOT DETECTED NOT DETECTED Final   Metapneumovirus NOT DETECTED NOT DETECTED Final   Rhinovirus / Enterovirus NOT DETECTED NOT DETECTED Final   Influenza A NOT DETECTED NOT DETECTED Final   Influenza B NOT DETECTED NOT DETECTED Final    Parainfluenza Virus 1 NOT DETECTED NOT DETECTED Final   Parainfluenza Virus 2 NOT DETECTED NOT DETECTED Final   Parainfluenza Virus 3 NOT DETECTED NOT DETECTED Final   Parainfluenza Virus 4 NOT DETECTED NOT DETECTED Final   Respiratory Syncytial Virus NOT DETECTED NOT DETECTED Final   Bordetella pertussis NOT DETECTED NOT DETECTED Final   Bordetella Parapertussis NOT DETECTED NOT DETECTED Final   Chlamydophila pneumoniae NOT DETECTED NOT DETECTED Final   Mycoplasma pneumoniae NOT DETECTED NOT DETECTED Final    Comment: Performed at [facility], [address] [place], [place] [address]  Urine Culture (for pregnant, neutropenic or urologic patients or patients with an indwelling urinary catheter)     Status: Abnormal   Collection Time: 10/04/24  6:40 AM   Specimen: Urine, Clean Catch  Result Value Ref Range Status   Specimen Description URINE, CLEAN CATCH  Final   Special Requests Normal  Final   Culture (A)  Final    <10,000 COLONIES/mL INSIGNIFICANT GROWTH Performed at [facility], [address], [place], [place] [address]    Report Status 10/05/2024 FINAL  Final  Resp panel by RT-PCR (RSV, Flu A&B, Covid) Anterior Nasal Swab     Status: None   Collection Time: 10/04/24 10:55 AM   Specimen: Anterior Nasal Swab  Result Value Ref Range Status   SARS Coronavirus 2 by RT PCR NEGATIVE NEGATIVE Final   Influenza A by PCR NEGATIVE NEGATIVE Final   Influenza B by PCR NEGATIVE NEGATIVE Final    Comment: (NOTE) The Xpert Xpress SARS-CoV-2/FLU/RSV plus assay is intended as an aid in the diagnosis of influenza from Nasopharyngeal swab specimens and should not be used as a sole basis for treatment. Nasal washings and aspirates are unacceptable for Xpert Xpress SARS-CoV-2/FLU/RSV testing.  Fact Sheet for Patients: bloggercourse.com  Fact Sheet for Healthcare Providers: seriousbroker.it  This test is not yet approved or  cleared by the [place]  FDA and has been authorized for detection and/or diagnosis of SARS-CoV-2 by FDA under an Emergency Use Authorization (EUA). This EUA will remain in effect (meaning this test can be used) for the duration of the COVID-19 declaration under Section 564(b)(1) of the Act, 21 U.S.C. section 360bbb-3(b)(1), unless the authorization is terminated or revoked.     Resp Syncytial Virus by PCR NEGATIVE NEGATIVE Final    Comment: (NOTE) Fact Sheet for Patients: bloggercourse.com  Fact Sheet for Healthcare Providers: seriousbroker.it  This test is not yet approved or cleared by the [place]  FDA and has been authorized for detection and/or diagnosis of SARS-CoV-2 by FDA under  an Emergency Use Authorization (EUA). This EUA will remain in effect (meaning this test can be used) for the duration of the COVID-19 declaration under Section 564(b)(1) of the Act, 21 U.S.C. section 360bbb-3(b)(1), unless the authorization is terminated or revoked.  Performed at [facility], [address] [place], [place] [address]      Radiology Studies: No results found.    Scheduled Meds:  sodium chloride    Intravenous Once   budesonide  (PULMICORT ) nebulizer solution  0.25 mg Nebulization BID   fluticasone   1 spray Each Nare Daily   furosemide   80 mg Intravenous BID   insulin  aspart  0-15 Units Subcutaneous TID WC   insulin  aspart  0-5 Units Subcutaneous QHS   insulin  aspart  5 Units Subcutaneous TID WC   insulin  glargine-yfgn  60 Units Subcutaneous Daily   ipratropium-albuterol   3 mL Nebulization Q6H   lactulose   30 g Oral BID   levothyroxine   50 mcg Oral Q0600   metoprolol  tartrate  12.5 mg Oral BID   nicotine   14 mg Transdermal Daily   pantoprazole  (PROTONIX ) IV  40 mg Intravenous Q12H   polyethylene glycol  17 g Oral Daily   rosuvastatin   40 mg Oral Daily   senna-docusate  2 tablet Oral BID   sodium chloride   flush  3 mL Intravenous Q12H   Continuous Infusions:  amiodarone  Stopped (10/07/24 0245)   heparin  1,450 Units/hr (10/08/24 0916)     LOS: 5 days    Time spent: 68    Sigurd Pac, MD [facility]   10/08/2024, 9:33 AM

## 2024-10-08 NOTE — Progress Notes (Signed)
 Progress Note  Patient Name: [name] Date of Encounter: 10/08/2024  Primary Cardiologist: Dorn Lesches, MD   Subjective   Patient is seen and examined at her bedside.   Inpatient Medications    Scheduled Meds:  sodium chloride    Intravenous Once   budesonide  (PULMICORT ) nebulizer solution  0.25 mg Nebulization BID   fluticasone   1 spray Each Nare Daily   furosemide   80 mg Intravenous BID   insulin  aspart  0-15 Units Subcutaneous TID WC   insulin  aspart  0-5 Units Subcutaneous QHS   insulin  aspart  5 Units Subcutaneous TID WC   insulin  glargine-yfgn  60 Units Subcutaneous Daily   ipratropium-albuterol   3 mL Nebulization Q6H   lactulose   30 g Oral BID   levothyroxine   50 mcg Oral Q0600   metoprolol  tartrate  12.5 mg Oral BID   nicotine   14 mg Transdermal Daily   pantoprazole  (PROTONIX ) IV  40 mg Intravenous Q12H   polyethylene glycol  17 g Oral Daily   rosuvastatin   40 mg Oral Daily   senna-docusate  2 tablet Oral BID   sodium chloride  flush  3 mL Intravenous Q12H   Continuous Infusions:  amiodarone  Stopped (10/07/24 0245)   heparin  1,450 Units/hr (10/08/24 0916)   PRN Meds: acetaminophen  **OR** acetaminophen , calcium  carbonate, guaiFENesin -dextromethorphan , ipratropium-albuterol , nicotine  polacrilex, nitroGLYCERIN , ondansetron  **OR** ondansetron  (ZOFRAN ) IV, oxyCODONE , sodium chloride  flush, sodium chloride  flush   Vital Signs    Vitals:   10/08/24 0713 10/08/24 0735 10/08/24 0737 10/08/24 0800  BP:   (!) 150/73   Pulse:   87   Resp:   18   Temp:   98.7 F (37.1 C)   TempSrc:  Oral Oral   SpO2: 98%  97% 93%  Weight:      Height:        Intake/Output Summary (Last 24 hours) at 10/08/2024 0946 Last data filed at 10/08/2024 0600 Gross per 24 hour  Intake 1229.56 ml  Output 1201 ml  Net 28.56 ml   Filed Weights   10/06/24 0409 10/07/24 0500 10/08/24 0657  Weight: 101.8 kg 103.4 kg 104 kg    Telemetry     - Personally Reviewed  ECG     -  Personally Reviewed  Physical Exam    General: Comfortable Head: Atraumatic, normal size  Eyes: PEERLA, EOMI  Neck: Supple, normal JVD Cardiac: Normal S1, S2; RRR; no murmurs, rubs, or gallops Lungs: Clear to auscultation bilaterally Abd: Soft, nontender, no hepatomegaly  Ext: warm, no edema Musculoskeletal: No deformities, BUE and BLE strength normal and equal Skin: Warm and dry, no rashes   Neuro: Alert and oriented to person, place, time, and situation, CNII-XII grossly intact, no focal deficits  Psych: Normal mood and affect   Labs  Chemistry Recent Labs  Lab 10/03/24 1142 10/04/24 0425 10/04/24 1651 10/06/24 0117 10/07/24 0608 10/08/24 0218  NA 142 139   < > 136 133* 137  K 5.0 4.7   < > 4.0 3.9 3.9  CL 103 102   < > 97* 92* 98  CO2 22 24   < > 27 23 27   GLUCOSE 210* 315*   < > 313* 167* 143*  BUN 98* 99*   < > 85* 80* 71*  CREATININE 2.46* 2.45*   < > 2.02* 2.47* 2.35*  CALCIUM  8.4* 8.1*   < > 8.0* 7.9* 8.0*  PROT 6.2* 6.6  --   --   --   --   ALBUMIN  3.2*  3.4*  --   --   --   --   AST 17 24  --   --   --   --   ALT 18 20  --   --   --   --   ALKPHOS 94 90  --   --   --   --   BILITOT 0.5 0.6  --   --   --   --   GFRNONAA 22* 22*   < > 28* 22* 23*  ANIONGAP 17* 13   < > 12 18* 12   < > = values in this interval not displayed.    Hematology Recent Labs  Lab 10/06/24 0117 10/07/24 9391 10/07/24 2251 10/08/24 0218  WBC 15.0* 7.7  --  8.1  RBC 2.71* 2.39*  --  2.89*  HGB 7.9* 7.1* 8.7* 8.4*  HCT 26.2* 22.9* 27.6* 26.7*  MCV 96.7 95.8  --  92.4  MCH 29.2 29.7  --  29.1  MCHC 30.2 31.0  --  31.5  RDW 18.9* 18.5*  --  19.9*  PLT 182 175  --  184    Cardiac EnzymesNo results for input(s): TROPONINI in the last 168 hours. No results for input(s): TROPIPOC in the last 168 hours.   BNP Recent Labs  Lab 10/03/24 1142 10/04/24 0000  BNP 386.8* 405.1*     DDimer No results for input(s): DDIMER in the last 168 hours.   Radiology    No results  found.  Cardiac Studies   Echo  Patient Profile     63 y.o. female   Assessment & Plan    NSTEMI Precordial chest pain - has resolved  PAF Depressed ejection fraction EF 30 to 35% with Left ventricular wall motion abnormalities Dilated cardiomyopathy Melanotic stool/anemia  Still no anginal symptoms. No plans for cardiac catheterization at this time.  Status post PRBC transfusion - hemoglobin appropriately adjusted Will continue to monitor, if hgb stays stable plans to transition to eliquis  tomorrow.   She is currently on Protonix  40 mg twice daily.   She will also be set up for outpatient workup for watchman as well.     Continue her rosuvastatin  40 mg daily.   In terms of GDMT in the setting of her depressed ejection fraction she is on metoprolol  to tartrate which we can transition to succinate prior to discharge, she is on BiDil .  Kidney function is a limiting factor in adjusting for other medications.   Smoking cessation advised.   For questions or updates, please contact [facility] Please consult [email or web address] for contact info under Cardiology/STEMI.      Signed, Vang Kraeger, DO  10/08/2024, 9:46 AM

## 2024-10-08 NOTE — TOC Progression Note (Addendum)
 Transition of Care [facility]) - Progression Note    Patient Details  Name: [name] MRN: [id number] Date of Birth: 1961-01-30  Transition of [facility]) CM/SW Contact  Waddell Barnie Rama, RN Phone Number: 10/08/2024, 12:05 PM  Clinical Narrative:    Per Palliative, patient wants OP Palliative services,  NCM offered choice, she chose [facility].  NCM informed [employer] to contact patient. Patient states she needs BSC, she does not have a preference of agency,  [employer] to supply the bsc to bedside.    Expected Discharge Plan: Home/Self Care Barriers to Discharge: Continued Medical Work up               Expected Discharge Plan and Services In-house Referral: NA Discharge Planning Services: CM Consult Post Acute Care Choice: NA Living arrangements for the past 2 months: Single Family Home                 DME Arranged: N/A DME Agency: NA       HH Arranged: NA           Social Drivers of Health (SDOH) Interventions SDOH Screenings   Food Insecurity: No Food Insecurity (10/05/2024)  Housing: Low Risk  (10/05/2024)  Transportation Needs: Unmet Transportation Needs (10/05/2024)  Utilities: Not At Risk (10/05/2024)  Alcohol Screen: Low Risk  (01/28/2020)  Depression (PHQ2-9): Low Risk  (02/09/2023)  Financial Resource Strain: Low Risk  (02/09/2023)  Physical Activity: Inactive (02/09/2023)  Social Connections: Unknown (08/21/2023)   Received from [facility]  Stress: No Stress Concern Present (02/09/2023)  Tobacco Use: High Risk (10/03/2024)    Readmission Risk Interventions    10/04/2024    1:51 PM 03/12/2024    4:08 PM 09/27/2023    3:52 PM  Readmission Risk Prevention Plan  Transportation Screening Complete Complete Complete  Medication Review [employer]) Complete Referral to Pharmacy Referral to Pharmacy  PCP or Specialist appointment within 3-5 days of discharge Complete Complete Complete  HRI or Home Care Consult Complete  Complete Complete  SW Recovery Care/Counseling Consult  Complete Complete  Palliative Care Screening Not Applicable Not Applicable Not Applicable  Skilled Nursing Facility Not Applicable Not Applicable Not Applicable

## 2024-10-08 NOTE — Progress Notes (Signed)
 TED hose removed at 0600, per patient request.

## 2024-10-08 NOTE — Progress Notes (Signed)
 [id number] [facility] Liaison Note  Notified by care manager of patient request for [facility] at home after discharge.  Hospital Liaison left message on patient's voicemail and will follow patient for discharge disposition.  Please call with any hospice or outpatient palliative care related questions.  Thank you for the opportunity to participate in this patient's care.  Inocente Jacobs BSN RN [facility] Liaison [phone]

## 2024-10-08 NOTE — Progress Notes (Addendum)
 Palliative Medicine Inpatient Follow Up Note HPI: [name] is a 62/F with PMH of heavy tobacco use, COPD, paroxysmal A-fib, chronic systolic CHF, CAD, PAD, CKD 4, gout, hypothyroidism, recurrent GI bleed presented to the ED with volume overload as well as anemia.   Palliative care has been asked to support goals of care conversations.   Today's Discussion 10/08/2024  *Please note that this is a verbal dictation therefore any spelling or grammatical errors are due to the Dragon Medical One system interpretation.  I reviewed the chart notes including nursing notes from today, progress notes from today. I also reviewed vital signs, nursing flowsheets, medication administrations record, labs, and imaging.    I met with [name] this morning, she is awake and in NAD. She shares with me that she feels like her breathing is easier this morning.   [name] had just met with Dr. Fairy in my presence. The point of her chronic disease burden and likely poor outcomes moving forward was emphasized. She had also spoken about patients poor candidacy for additional GI procedures and the risks/benefits of anticoagulation.   [name] shares awareness of her current health. She maintains the hope of going on a vacation with her daughter at some point in time. She right now does not feel ready for hospice however is willing to meet with [facility] to learn more about their services when that time does come.   Created space and opportunity for patient to explore thoughts feelings and fears regarding current medical situation. She shares her ongoing weakness if very inhibitory. We discussed that she would like an electric wheelchair so that she is still able to mobilize. I shared that I would reach out to her primary care to see if this would be possible.  Emotional support provided given all that [name] is going through at this time.   Questions and concerns addressed/Palliative Support Provided.   Objective  Assessment: Vital Signs Vitals:   10/08/24 0737 10/08/24 0800  BP: (!) 150/73   Pulse: 87   Resp: 18   Temp: 98.7 F (37.1 C)   SpO2: 97% 93%    Intake/Output Summary (Last 24 hours) at 10/08/2024 1114 Last data filed at 10/08/2024 0900 Gross per 24 hour  Intake 1469.56 ml  Output 1301 ml  Net 168.56 ml   Last Weight  Most recent update: 10/08/2024  6:57 AM    Weight  104 kg (229 lb 4.5 oz)            Gen:  Older Caucasian F chronically ill appearing HEENT: moist mucous membranes CV: Regular rate and irregular rhythm  PULM:  On 1LPM [place], (+) audible wheeze, breathing is even and nonlabored ABD: soft/nontender  EXT: No edema  Neuro: Alert and oriented x3   SUMMARY OF RECOMMENDATIONS   DNAR/DNI   MOST form completed and placed in Vynca   Open and honest conversations held in the setting of [name]'s acute on chronic disease burden   [name] would like to allow time for outcomes and treatment is treatable   OP Palliative support requested for discharge --> [facility] plan to meet with [name] to discuss hospice as well for informational purposes  Patient interested in an electric wheelchair - PCP is not in the [facility] system though I was able to call [employer] and endorse this request   Ongoing PMT support ______________________________________________________________________________________ [name] [facility] Palliative Medicine Team Team Cell Phone: [phone] Please utilize secure chat with additional questions, if there is  no response within 30 minutes please call the above phone number  Time Spent: 50 Billing based on MDM: High   Palliative Medicine Team providers are available by phone from 7am to 7pm daily and can be reached through the team cell phone.  Should this patient require assistance outside of these hours, please call the patient's attending physician.

## 2024-10-08 NOTE — Progress Notes (Addendum)
 This chaplain responded to PMT NP-Michelle consult. The chaplain understands the Pt. and family is requesting creation of the Pt. Advance Directive. The Pt. is open to AD education at the time of the chaplain's visit.  The chaplain completed AD: HCPOA and Living Will education with the Pt. The Pt. answered the chaplain's clarifying questions.  The Pt. named Michaelle Hammock as her healthcare agent. If this person is unable or unwilling to serve in this role the Pt. next choice is [employer]. The Pt. prefers to wait until her sister-Billie visits before calling the notary. The chaplain secure chatted the Pt. RN for notification of the Pt. sister's arrival.  **[id number] The chaplain understands the Pt. sister-Billie visited earlier today. The chaplain plans to ask for a notary for AD completion on Wednesday. The Pt. accepted the chaplain's invitation to call Billie during the visit.  This chaplain is available for F/U spiritual care as needed.  Chaplain Leeroy Hummer [phone]

## 2024-10-08 NOTE — Progress Notes (Signed)
 Mobility Specialist Progress Note:    10/08/24 0927  Mobility  Activity Ambulated with assistance  Level of Assistance Contact guard assist, steadying assist  Assistive Device Front wheel walker  Distance Ambulated (ft) 15 ft  Range of Motion/Exercises Active  Activity Response Tolerated well  Mobility Referral Yes  Mobility visit 1 Mobility  Mobility Specialist Start Time (ACUTE ONLY) [id number]  Mobility Specialist Stop Time (ACUTE ONLY) 0943  Mobility Specialist Time Calculation (min) (ACUTE ONLY) 16 min   Received pt laying in bed agreeable to session. No c/o any symptoms. Pt moving and ambulating well. Pt able to sup>sit and sit>stand by self. CGA for ambulating. Returned pt to bed w/ all needs met.   Venetia Keel Mobility Specialist Please Neurosurgeon or Rehab Office at [phone]

## 2024-10-08 NOTE — Evaluation (Signed)
 Physical Therapy Evaluation Patient Details Name: [name] MRN: [id number] DOB: Apr 08, 1961 Today's Date: 10/08/2024  History of Present Illness  Pt is a 63 y.o. female who presented 10/03/24 due to abdominal pain, blood in stool, gastroesophageal reflux and cough with SOB.  Chest xray showed  mild cardiomegaly, pulmonary vascular congestion and bibasilar atelectasis. UA showed evidence of UTI. 11/23 cardioversion. PMH: GI bleed, CAD, HFrEF, CAD, CKD stage IV, carotid stenosis s/p endarterectomy, paroxysmal AFib, PAD, COPD, GERD, smoker, chronic bradycardia, prior substance abuse, gout, HTN, DM type II, HLD, and vitamin D  deficiency  Clinical Impression  Prior to admittance, pt was performing slide transfers or stand pivot transfers to a manual wheelchair. Pt reports utilizing rollator for ambulating across the street to the park, but otherwise uses her manual wheelchair. Pt presents to evaluation with deficits in mobility, strength, balance, and activity tolerance. Pt was able to ambulate short room level distances w/ AD and no physical assistance given. Pt was educated on and participated in general LE strength exercises. PT will continue to treat pt while she is admitted. Anticipate no follow-up therapies will be needed at discharge as pt reports she is near her baseline.       If plan is discharge home, recommend the following: A little help with walking and/or transfers;A little help with bathing/dressing/bathroom;Assistance with cooking/housework;Direct supervision/assist for medications management;Direct supervision/assist for financial management   Can travel by private vehicle        Equipment Recommendations None recommended by PT  Recommendations for Other Services       Functional Status Assessment Patient has had a recent decline in their functional status and demonstrates the ability to make significant improvements in function in a reasonable and predictable amount of time.      Precautions / Restrictions Precautions Precautions: Fall Recall of Precautions/Restrictions: Intact Precaution/Restrictions Comments: watch HR, BP, o2 Restrictions Weight Bearing Restrictions Per Provider Order: No      Mobility  Bed Mobility Overal bed mobility: Needs Assistance Bed Mobility: Supine to Sit, Sit to Supine     Supine to sit: Supervision, HOB elevated Sit to supine: Supervision, HOB elevated   General bed mobility comments: Pt completed bed mobility w/ HOB elevated and supervision. Increased time to complete.    Transfers Overall transfer level: Needs assistance Equipment used: Rolling walker (2 wheels) Transfers: Sit to/from Stand Sit to Stand: Supervision           General transfer comment: STS from edge of bed w/ RW and no physical assistance given. Pt demonstrates good forward trunk lean and control w/ both ascent and descent. VC given to push up from surface w/ BUE as pt was attempting to pull to stand w/ RW.    Ambulation/Gait Ambulation/Gait assistance: Supervision Gait Distance (Feet): 24 Feet Assistive device: Rolling walker (2 wheels) Gait Pattern/deviations: Step-through pattern, Decreased stride length, Trunk flexed Gait velocity: reduced Gait velocity interpretation: <1.31 ft/sec, indicative of household ambulator   General Gait Details: Pt ambulates w/ reliance on RW for external support and demonstrates forward trunk lean with reduced gait speed while navigating turns.  Stairs            Wheelchair Mobility     Tilt Bed    Modified Rankin (Stroke Patients Only)       Balance Overall balance assessment: Needs assistance Sitting-balance support: No upper extremity supported, Feet supported Sitting balance-Leahy Scale: Good Sitting balance - Comments: able to sit EOB w/out UE support and no signs of loss  of balance   Standing balance support: Bilateral upper extremity supported, Reliant on assistive device for  balance, During functional activity Standing balance-Leahy Scale: Poor Standing balance comment: reliant on external support                             Pertinent Vitals/Pain Pain Assessment Pain Assessment: Faces Faces Pain Scale: Hurts a little bit Pain Location: abdomen when she coughs Pain Descriptors / Indicators: Aching Pain Intervention(s): Limited activity within patient's tolerance, Monitored during session    Home Living Family/patient expects to be discharged to:: Private residence Living Arrangements: Other relatives Available Help at Discharge: Family;Available 24 hours/day Type of Home: Apartment Home Access: Level entry       Home Layout: One level Home Equipment: Shower seat;Cane - quad;Wheelchair - manual;Other (comment);Rollator (4 wheels) (tub lift)      Prior Function Prior Level of Function : Needs assist       Physical Assist : ADLs (physical)   ADLs (physical): Dressing (donning socks) Mobility Comments: Pt reports pivoting or scooting into manual WC for mobility. Pt also reports occasionallly she will ambulate to park across the street w/out an AD, but she predominantly uses manual WC. ADLs Comments: Pt receives help from sister for donning socks, and has a paramedic come once per week to assist her with her medications. Her sister also helps her w/ her finances.     Extremity/Trunk Assessment   Upper Extremity Assessment Upper Extremity Assessment: Defer to OT evaluation    Lower Extremity Assessment Lower Extremity Assessment: Generalized weakness;RLE deficits/detail RLE Deficits / Details: right knee extension 4/5 with all other RLE and LLE 4-/5    Cervical / Trunk Assessment Cervical / Trunk Assessment: Kyphotic  Communication   Communication Communication: No apparent difficulties    Cognition Arousal: Alert Behavior During Therapy: WFL for tasks assessed/performed   PT - Cognitive impairments: No apparent  impairments                         Following commands: Intact       Cueing Cueing Techniques: Verbal cues, Visual cues     General Comments General comments (skin integrity, edema, etc.): Sp02 dropped to 87% with activity but with cues for pursed lip breathing was ble to obtain 94% w/out changing amount of oxygen  given    Exercises General Exercises - Lower Extremity Ankle Circles/Pumps: AROM, Both, 10 reps Heel Slides: AROM, Both, 10 reps Straight Leg Raises: AROM, Both, 10 reps   Assessment/Plan    PT Assessment Patient needs continued PT services  PT Problem List Decreased strength;Decreased activity tolerance;Decreased mobility;Cardiopulmonary status limiting activity       PT Treatment Interventions DME instruction;Gait training;Functional mobility training;Therapeutic activities;Therapeutic exercise;Balance training;Patient/family education;Wheelchair mobility training    PT Goals (Current goals can be found in the Care Plan section)  Acute Rehab PT Goals Patient Stated Goal: to quit smoking and get better PT Goal Formulation: With patient Time For Goal Achievement: 10/22/24 Potential to Achieve Goals: Fair Additional Goals Additional Goal #1: Pt will be mod I with wheelchair mobility    Frequency Min 1X/week     Co-evaluation               AM-PAC PT 6 Clicks Mobility  Outcome Measure Help needed turning from your back to your side while in a flat bed without using bedrails?: None Help needed moving from lying on your  back to sitting on the side of a flat bed without using bedrails?: A Little Help needed moving to and from a bed to a chair (including a wheelchair)?: A Little Help needed standing up from a chair using your arms (e.g., wheelchair or bedside chair)?: A Little Help needed to walk in hospital room?: A Little Help needed climbing 3-5 steps with a railing? : Total 6 Click Score: 17    End of Session Equipment Utilized During  Treatment: Gait belt;Oxygen  (1L) Activity Tolerance: Patient tolerated treatment well Patient left: in bed;with call bell/phone within reach Nurse Communication: Mobility status PT Visit Diagnosis: Muscle weakness (generalized) (M62.81);Difficulty in walking, not elsewhere classified (R26.2)    Time: 9047-8979 PT Time Calculation (min) (ACUTE ONLY): 28 min   Charges:   PT Evaluation $PT Eval Low Complexity: 1 Low PT Treatments $Therapeutic Exercise: 8-22 mins PT General Charges $$ ACUTE PT VISIT: 1 Visit         Leontine Hilt DPT Acute Rehab Services [phone] Prefer contact via chat   Leontine NOVAK Malyssa Maris 10/08/2024, 12:11 PM

## 2024-10-09 ENCOUNTER — Inpatient Hospital Stay (HOSPITAL_COMMUNITY)

## 2024-10-09 DIAGNOSIS — I429 Cardiomyopathy, unspecified: Secondary | ICD-10-CM | POA: Diagnosis not present

## 2024-10-09 DIAGNOSIS — I480 Paroxysmal atrial fibrillation: Secondary | ICD-10-CM | POA: Diagnosis not present

## 2024-10-09 DIAGNOSIS — R7989 Other specified abnormal findings of blood chemistry: Secondary | ICD-10-CM | POA: Diagnosis not present

## 2024-10-09 DIAGNOSIS — R072 Precordial pain: Secondary | ICD-10-CM | POA: Diagnosis not present

## 2024-10-09 DIAGNOSIS — I5021 Acute systolic (congestive) heart failure: Secondary | ICD-10-CM | POA: Diagnosis not present

## 2024-10-09 LAB — GLUCOSE, CAPILLARY
Glucose-Capillary: 103 mg/dL — ABNORMAL HIGH (ref 70–99)
Glucose-Capillary: 273 mg/dL — ABNORMAL HIGH (ref 70–99)
Glucose-Capillary: 245 mg/dL — ABNORMAL HIGH (ref 70–99)
Glucose-Capillary: 89 mg/dL (ref 70–99)

## 2024-10-09 LAB — APTT: aPTT: 60 s — ABNORMAL HIGH (ref 24–36)

## 2024-10-09 LAB — HEPARIN LEVEL (UNFRACTIONATED): Heparin Unfractionated: 0.44 [IU]/mL (ref 0.30–0.70)

## 2024-10-09 LAB — CBC
HCT: 28 % — ABNORMAL LOW (ref 36.0–46.0)
Hemoglobin: 8.6 g/dL — ABNORMAL LOW (ref 12.0–15.0)
MCH: 28.6 pg (ref 26.0–34.0)
MCHC: 30.7 g/dL (ref 30.0–36.0)
MCV: 93 fL (ref 80.0–100.0)
Platelets: 210 K/uL (ref 150–400)
RBC: 3.01 MIL/uL — ABNORMAL LOW (ref 3.87–5.11)
RDW: 19.3 % — ABNORMAL HIGH (ref 11.5–15.5)
WBC: 8.6 K/uL (ref 4.0–10.5)
nRBC: 0 % (ref 0.0–0.2)

## 2024-10-09 LAB — BASIC METABOLIC PANEL WITH GFR
Anion gap: 10 (ref 5–15)
BUN: 60 mg/dL — ABNORMAL HIGH (ref 8–23)
CO2: 29 mmol/L (ref 22–32)
Calcium: 8.5 mg/dL — ABNORMAL LOW (ref 8.9–10.3)
Chloride: 99 mmol/L (ref 98–111)
Creatinine, Ser: 1.92 mg/dL — ABNORMAL HIGH (ref 0.44–1.00)
GFR, Estimated: 29 mL/min — ABNORMAL LOW (ref >60)
Glucose, Bld: 96 mg/dL (ref 70–99)
Potassium: 3.5 mmol/L (ref 3.5–5.1)
Sodium: 138 mmol/L (ref 135–145)

## 2024-10-09 LAB — MAGNESIUM: Magnesium: 2.3 mg/dL (ref 1.7–2.4)

## 2024-10-09 LAB — TROPONIN I (HIGH SENSITIVITY)
Troponin I (High Sensitivity): 337 ng/L (ref <18)
Troponin I (High Sensitivity): 217 ng/L (ref <18)

## 2024-10-09 MED ORDER — AMIODARONE IV BOLUS ONLY 150 MG/100ML: 150.0000 mg | INTRAVENOUS | Status: DC | PRN

## 2024-10-09 MED ORDER — APIXABAN 5 MG PO TABS
5.0000 mg | ORAL_TABLET | Freq: Two times a day (BID) | ORAL | Status: DC
  Administered 2024-10-09 – 2024-10-12 (×7): 5 mg via ORAL
  Filled 2024-10-09 (×7): qty 1

## 2024-10-09 MED ORDER — PNEUMOCOCCAL 20-VAL CONJ VACC 0.5 ML IM SUSY
0.5000 mL | PREFILLED_SYRINGE | INTRAMUSCULAR | Status: AC
  Administered 2024-10-11: 0.5 mL via INTRAMUSCULAR
  Filled 2024-10-09: qty 0.5

## 2024-10-09 MED ORDER — EMPAGLIFLOZIN 10 MG PO TABS
10.0000 mg | ORAL_TABLET | Freq: Every day | ORAL | Status: DC
  Administered 2024-10-09 – 2024-10-10 (×2): 10 mg via ORAL
  Filled 2024-10-09 (×2): qty 1

## 2024-10-09 MED ORDER — METOPROLOL TARTRATE 5 MG/5ML IV SOLN
5.0000 mg | Freq: Once | INTRAVENOUS | Status: AC
  Administered 2024-10-09: 5 mg via INTRAVENOUS

## 2024-10-09 MED ORDER — INFLUENZA VIRUS VACC SPLIT PF (FLUZONE) 0.5 ML IM SUSY
0.5000 mL | PREFILLED_SYRINGE | INTRAMUSCULAR | Status: AC
  Administered 2024-10-12: 0.5 mL via INTRAMUSCULAR
  Filled 2024-10-09: qty 0.5

## 2024-10-09 MED ORDER — AMIODARONE HCL IN DEXTROSE 360-4.14 MG/200ML-% IV SOLN
30.0000 mg/h | INTRAVENOUS | Status: DC
  Administered 2024-10-09 – 2024-10-11 (×4): 30 mg/h via INTRAVENOUS
  Filled 2024-10-09 (×4): qty 200

## 2024-10-09 MED ORDER — FUROSEMIDE 10 MG/ML IJ SOLN
80.0000 mg | Freq: Two times a day (BID) | INTRAMUSCULAR | Status: DC
Start: 2024-10-09 — End: 2024-10-10
  Administered 2024-10-09: 80 mg via INTRAVENOUS
  Filled 2024-10-09: qty 8

## 2024-10-09 MED ORDER — METOPROLOL TARTRATE 25 MG PO TABS
25.0000 mg | ORAL_TABLET | Freq: Two times a day (BID) | ORAL | Status: DC
  Administered 2024-10-09 – 2024-10-12 (×6): 25 mg via ORAL
  Filled 2024-10-09 (×6): qty 1

## 2024-10-09 MED ORDER — TORSEMIDE 20 MG PO TABS: 40.0000 mg | ORAL_TABLET | Freq: Two times a day (BID) | ORAL | Status: DC

## 2024-10-09 MED ORDER — METOPROLOL TARTRATE 12.5 MG HALF TABLET
12.5000 mg | ORAL_TABLET | Freq: Once | ORAL | Status: AC
  Administered 2024-10-09: 12.5 mg via ORAL
  Filled 2024-10-09: qty 1

## 2024-10-09 MED ORDER — AMIODARONE HCL IN DEXTROSE 360-4.14 MG/200ML-% IV SOLN
INTRAVENOUS | Status: AC
  Filled 2024-10-09: qty 200

## 2024-10-09 MED ORDER — METOPROLOL TARTRATE 5 MG/5ML IV SOLN
INTRAVENOUS | Status: AC
  Filled 2024-10-09: qty 5

## 2024-10-09 MED ORDER — AMIODARONE HCL IN DEXTROSE 360-4.14 MG/200ML-% IV SOLN
60.0000 mg/h | INTRAVENOUS | Status: AC
  Administered 2024-10-09 (×2): 60 mg/h via INTRAVENOUS

## 2024-10-09 NOTE — Significant Event (Signed)
 1028: This RN providing patient with scheduled morning medications. Pt sitting up in chair without complaints. Monitor started alarming with HR in 140's/150's and sustaining. Pt then reported she was getting headache 10/10. Pt assisted safely back to bed.  1029: Page sent to cardiology team.  1030: Rapid RN called.  1034: Rapid Water Engineer at bedside. Pt reporting 10/10 chest pain in upper chest and throat, radiating to right arm. Pt given 1 tab SL nitroglycerin  per MAR.  1043: Pt reporting chest pain 9/10. Given second nitroglycerin  SL tab per MAR.  1050: Amiodarone  IV bolus started by rapid RN.  1054: Attending paged.  1105: Attending MD at bedside. Verbal orders given for continuous amiodarone  drip (see MAR). Pt reporting chest pain decreased to 5/10.  1156: HR sustaining 120's/130's rising occasionally to 140's/150's. MD aware.

## 2024-10-09 NOTE — Progress Notes (Signed)
 PHARMACY - ANTICOAGULATION CONSULT NOTE  Pharmacy Consult for IV heparin  Indication: atrial fibrillation  Allergies  Allergen Reactions   Neurontin  [Gabapentin ] Nausea And Vomiting and Other (See Comments)    POSSIBLE SHAKING   Lyrica  [Pregabalin ] Other (See Comments)    Shaking     Patient Measurements: Height: 5' 3 (160 cm) Weight: 103.4 kg (227 lb 15.3 oz) IBW/kg (Calculated) : 52.4 HEPARIN  DW (KG): 76.4  Vital Signs: Temp: 98.4 F (36.9 C) (11/26 0400) Temp Source: Oral (11/26 0400) BP: 142/73 (11/26 0400) Pulse Rate: 79 (11/26 0400)  Labs: Recent Labs    10/06/24 2047 10/06/24 2047 10/06/24 2237 10/07/24 0608 10/07/24 1735 10/07/24 2251 10/08/24 0218 10/09/24 0228  HGB  --    < >  --  7.1*  --  8.7* 8.4* 8.6*  HCT  --    < >  --  22.9*  --  27.6* 26.7* 28.0*  PLT  --   --   --  175  --   --  184 210  APTT 34  --   --  57* 52*  --  59* 60*  HEPARINUNFRC 0.76*  --   --  0.74*  --   --  0.51 0.44  CREATININE  --   --   --  2.47*  --   --  2.35* 1.92*  TROPONINIHS 361*  --  470*  --   --   --   --   --    < > = values in this interval not displayed.    Estimated Creatinine Clearance: 35.4 mL/min (A) (by C-G formula based on SCr of 1.92 mg/dL (H)).   Medical History: Past Medical History:  Diagnosis Date   Acute renal failure    Acute respiratory failure with hypoxia ([name]) 12/02/2019   Alcohol abuse    Alcoholic cirrhosis ([name])    Anemia    Anxiety    Arthritis    knees (11/26/2018)   B12 deficiency    CAD (coronary artery disease)    a. 11/10/2014 Cath: LM nl, LAD min irregs, D1 30 ost, D2 50d, LCX 50m, OM1 80 p/m (1.5 mm vessel), OM2 4m, RCA nondom 97m-->med rx.. Demand ischemia in the setting of rapid a-fib.   Cardiomyopathy ([name])    Carotid artery disease    a. 01/2015 Carotid Angio: RICA 100, LICA 95p; b. 02/2015 s/p L CEA; c. 05/2019 Carotid U/S: RICA 100. RECA >50. LICA 1-39%.   Cellulitis    lower extremities   Cellulitis in diabetic foot  ([name]) 07/08/2019   CHF (congestive heart failure) ([name])    Chronic combined systolic and diastolic CHF (congestive heart failure) ([name])    a. 10/2014 Echo: EF 40-45%; b. 10/2018 Echo: EF 45-50%, gr2 DD; c. 11/2019 Echo: EF 50%, mild LVH, gr2 DD (restrictive), antlat HK, Nl RV fxn. Mild BAE. RVSP 59.59mmHg.   CKD (chronic kidney disease), stage III ([name])    Cocaine abuse ([name])    COPD (chronic obstructive pulmonary disease) ([name])    Critical ischemia of foot ([name]) 06/07/2021   Critical limb ischemia     Critical lower limb ischemia ([name])    Demand ischemia ([name]) 10/29/2014   Depression    Diabetes mellitus without complication ([name])    Diabetic peripheral neuropathy ([name])    DVT (deep venous thrombosis) ([name])    Dyspnea    Elevated troponin    a. Chronic elevation.   Fall 05/05/2021   Femoro-popliteal artery disease    Peripheral arterial  disease/critical limb ischemia     GERD (gastroesophageal reflux disease)    Hyperlipemia    Hypertension    Hypokalemia    Hypomagnesemia    Hypothyroidism    Marijuana abuse    Narcotic abuse ([name])    Noncompliance    NSVT (nonsustained ventricular tachycardia) ([name])    Obesity    Osteomyelitis ([name]) 06/21/2019   PAF (paroxysmal atrial fibrillation) ([name])    Paroxysmal atrial tachycardia    Peripheral arterial disease    a. 01/2015 Angio/PTA: RSFA 99 (atherectomy/pta) - 1 vessel runoff via diff dzs peroneal; b. 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   Pneumonia    once or twice (11/26/2018)   Poorly controlled type 2 diabetes mellitus ([name])    Renal disorder    Renal insufficiency    a. 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   SIRS (systemic inflammatory response syndrome) ([name]) 04/06/2017   Sleep apnea    couldn't handle wearing the mask (11/26/2018)   Symptomatic bradycardia    a. Avoid AV blocking agent per EP. Prev req temp wire in 2017.   Tobacco abuse    Wrist fracture, left, closed, initial encounter 01/29/2015     Medications:  Infusions:   amiodarone  Stopped (10/07/24 0245)   heparin  1,450 Units/hr (10/09/24 0109)    Assessment: 63 yo female on chronic Eliquis  for afib, currently on hold for melena / GI bleed.  Pharmacy asked to start IV heparin  with no bolus.  Hgb trending down 7.9 > 7.1 with orders for PRBC x 1 11/24.  Of note, patient went into afib overnight 11/23, did not tolerate amiodarone , and required DCCV.  Pharmacy aiming for low end of heparin  level / aPTT goal.  Currently monitoring both levels with recent apixaban  use (last dose 11/20 AM).  Heparin  level remains falsely elevated.  aPTT slightly below goal on 1450 units/hr.  Goal of Therapy:  Heparin  level 0.3-0.5 aPTT 66-85 sec Monitor platelets by anticoagulation protocol: Yes   Plan:  Increase IV heparin  to 1550 units/hr. Daily aPTT, heparin  level, and CBC. Follow-up restart of oral anticoagulation.  [employer], Pharm.D., BCPS Clinical Pharmacist Clinical phone for 10/09/2024 from 7:30-3:00 is [phone].  **Pharmacist phone directory can be found on [email or web address] listed under [facility].  10/09/2024 9:31 AM

## 2024-10-09 NOTE — Progress Notes (Signed)
 Progress Note  Patient Name: [name] Date of Encounter: 10/09/2024  Primary Cardiologist: Dorn Lesches, MD   Subjective   Follow up visit after earlier note. Patient went into afib rvr so amio started.   Patient is seen and examined at her bedside.   Inpatient Medications    Scheduled Meds:  sodium chloride    Intravenous Once   apixaban   5 mg Oral BID   budesonide  (PULMICORT ) nebulizer solution  0.25 mg Nebulization BID   empagliflozin   10 mg Oral Daily   fluticasone   1 spray Each Nare Daily   furosemide   80 mg Intravenous BID   influenza vac split trivalent PF  0.5 mL Intramuscular Tomorrow-1000   insulin  aspart  0-15 Units Subcutaneous TID WC   insulin  aspart  0-5 Units Subcutaneous QHS   insulin  aspart  5 Units Subcutaneous TID WC   insulin  glargine-yfgn  60 Units Subcutaneous Daily   lactulose   30 g Oral BID   levothyroxine   50 mcg Oral Q0600   metoprolol  tartrate  25 mg Oral BID   nicotine   14 mg Transdermal Daily   pantoprazole  (PROTONIX ) IV  40 mg Intravenous Q12H   pneumococcal 20-valent conjugate vaccine  0.5 mL Intramuscular Tomorrow-1000   polyethylene glycol  17 g Oral Daily   rosuvastatin   40 mg Oral Daily   senna-docusate  2 tablet Oral BID   sodium chloride  flush  3 mL Intravenous Q12H   Continuous Infusions:  amiodarone  60 mg/hr (10/09/24 1428)   amiodarone      amiodarone      PRN Meds: acetaminophen  **OR** acetaminophen , amiodarone , calcium  carbonate, guaiFENesin -dextromethorphan , nicotine  polacrilex, nitroGLYCERIN , ondansetron  **OR** ondansetron  (ZOFRAN ) IV, oxyCODONE , sodium chloride  flush, sodium chloride  flush   Vital Signs    Vitals:   10/09/24 1100 10/09/24 1110 10/09/24 1137 10/09/24 1705  BP: (!) 122/98 114/86 101/87 114/75  Pulse: 88 (!) 105 (!) 145 73  Resp: (!) 32 12  18  Temp:    99 F (37.2 C)  TempSrc:    Oral  SpO2: 96% 96%  98%  Weight:      Height:        Intake/Output Summary (Last 24 hours) at 10/09/2024  1716 Last data filed at 10/09/2024 1542 Gross per 24 hour  Intake 1182.79 ml  Output 1650 ml  Net -467.21 ml   Filed Weights   10/07/24 0500 10/08/24 0657 10/09/24 0400  Weight: 103.4 kg 104 kg 103.4 kg    Telemetry     Sinus rhythm - Personally Reviewed  ECG     - Personally Reviewed  Physical Exam    General: Comfortable Head: Atraumatic, normal size  Eyes: PEERLA, EOMI  Neck: Supple, normal JVD Cardiac: Normal S1, S2; RRR; no murmurs, rubs, or gallops Lungs: Clear to auscultation bilaterally Abd: Soft, nontender, no hepatomegaly  Ext: warm, no edema. With noted metatarsal amputation Musculoskeletal: No deformities, BUE and BLE strength normal and equal Skin: Warm and dry, no rashes   Neuro: Alert and oriented to person, place, time, and situation, CNII-XII grossly intact, no focal deficits  Psych: Normal mood and affect   Labs  Chemistry Recent Labs  Lab 10/03/24 1142 10/04/24 0425 10/04/24 1651 10/07/24 0608 10/08/24 0218 10/09/24 0228  NA 142 139   < > 133* 137 138  K 5.0 4.7   < > 3.9 3.9 3.5  CL 103 102   < > 92* 98 99  CO2 22 24   < > 23 27 29   GLUCOSE 210*  315*   < > 167* 143* 96  BUN 98* 99*   < > 80* 71* 60*  CREATININE 2.46* 2.45*   < > 2.47* 2.35* 1.92*  CALCIUM  8.4* 8.1*   < > 7.9* 8.0* 8.5*  PROT 6.2* 6.6  --   --   --   --   ALBUMIN  3.2* 3.4*  --   --   --   --   AST 17 24  --   --   --   --   ALT 18 20  --   --   --   --   ALKPHOS 94 90  --   --   --   --   BILITOT 0.5 0.6  --   --   --   --   GFRNONAA 22* 22*   < > 22* 23* 29*  ANIONGAP 17* 13   < > 18* 12 10   < > = values in this interval not displayed.    Hematology Recent Labs  Lab 10/07/24 [phone] 10/07/24 2251 10/08/24 0218 10/09/24 0228  WBC 7.7  --  8.1 8.6  RBC 2.39*  --  2.89* 3.01*  HGB 7.1* 8.7* 8.4* 8.6*  HCT 22.9* 27.6* 26.7* 28.0*  MCV 95.8  --  92.4 93.0  MCH 29.7  --  29.1 28.6  MCHC 31.0  --  31.5 30.7  RDW 18.5*  --  19.9* 19.3*  PLT 175  --  184 210     Cardiac EnzymesNo results for input(s): TROPONINI in the last 168 hours. No results for input(s): TROPIPOC in the last 168 hours.   BNP Recent Labs  Lab 10/03/24 1142 10/04/24 0000  BNP 386.8* 405.1*     DDimer No results for input(s): DDIMER in the last 168 hours.   Radiology    DG CHEST PORT 1 VIEW Result Date: 10/09/2024 EXAM: 1 VIEW(S) XRAY OF THE CHEST 10/09/2024 11:32:28 AM COMPARISON: 10/03/2024 CLINICAL HISTORY: SOB (shortness of breath) FINDINGS: LUNGS AND PLEURA: Lower lung volumes. Increased peribronchial cuffing and interstitial markings with increased right basilar airspace opacities. No pleural effusion. No pneumothorax. HEART AND MEDIASTINUM: Cardiomegaly. Cardiomems device noted. BONES AND SOFT TISSUES: No acute osseous abnormality. IMPRESSION: 1. Findings most consistent with pulmonary edema with increased right basilar airspace opacities, which may be accentuated by low lung volumes. 2. Cardiomegaly with CardioMEMS device. Electronically signed by: Katheleen Faes MD 10/09/2024 12:35 PM EST RP Workstation: [id number]    Cardiac Studies   Echo  Patient Profile     63 y.o. female   Assessment & Plan    NSTEMI Precordial chest pain - has resolved  PAF Depressed ejection fraction EF 30 to 35% with Left ventricular wall motion abnormalities Dilated cardiomyopathy Melanotic stool/anemia   Was in afib rvr earlier required amio gtt, now back in Sinus rhythm, Will recommend continuing this for now,.  Still no anginal symptoms. No plans for cardiac catheterization at this time.  Status post PRBC transfusion - hemoglobin appropriately adjusted and stable.  Started on eliquis  5 mg BID will continue to monitor hgb.  She is currently on Protonix  40 mg twice daily.   She will also be set up for outpatient workup for watchman as well.     Continue her rosuvastatin  40 mg daily.   In terms of GDMT in the setting of her depressed ejection fraction she is on  metoprolol  to tartrate which we can transition to succinate prior to discharge, she is on BiDil .  Kidney  function is a limiting factor in adjusting for other medications.   Smoking cessation advised.   For questions or updates, please contact [facility] Please consult [email or web address] for contact info under Cardiology/STEMI.      Signed, Odessa Nishi, DO  10/09/2024, 5:16 PM

## 2024-10-09 NOTE — Progress Notes (Signed)
 PROGRESS NOTE    [name]  FMW:[id number] DOB: Feb 28, 1961 DOA: 10/03/2024 PCP: Campbell Reynolds, NP  Chronically ill 62/F with heavy tobacco use, COPD, paroxysmal A-fib, chronic systolic CHF, CAD, PAD, CKD 4, gout, hypothyroidism, recurrent GI bleed presented to the ED with volume overload as well as anemia, melena -GI and cardiology following, no plan for repeat endoscopic eval at this time - Ischemic workup limited, existing CKD - 11/23 overnight, became unstable with A-fib RVR, heart rate in 170s, cardioverted - 11/24, hemoglobin down to 7.1, transfusing, also orthostatic, holding Bidil  - 11/25, discussed with GI, poor candidate for deep enteroscopy, no plans for VCE  Subjective: - Initially.  Feeling well, later in the morning developed chest pain with A-fib RVR  Assessment and Plan:  Upper GI bleed with melena. Acute on chronic anemia Presented with epigastric pain and 2 episodes of black tarry stool. Similar presentation in May 2025 at which time EGD and colonoscopy did not show any evidence of active bleeding. [facility] was consulted Hemoglobin down to 7.1 yesterday, transfused 1 unit PRBC, continue PPI -Discussed with GI this morning she is felt to be a poor candidate for deep enteroscopy, hence no plan to pursue VCE  - Eliquis  resumed -Discussed discontinuing anticoagulation with the patient altogether, not without risk of CVA considering A-fib and extensive risk factors   Acute on chronic HFrEF. Severe mitral valve regurgitation. -Last echo with EF 30-35%, grade 1 DD, normal RV, moderate to severe mitral regurgitation -Improving with diuresis, remains volume overloaded  - Repeat x-ray still with pulmonary edema, continue Lasix  80 mg twice daily, add metolazone  later today if blood pressure tolerates - GDMT limited by CKD 4 - Cards following, poor candidate for invasive workup, palliative following as well   Elevated troponin. History of CAD. Ongoing chest  pain -Troponin peaked to 377, now improving, felt to be demand ischemia, has chronic LBBB -Cards following, no plan for ischemic workup with CKD 4 -Continue statin, Eliquis    COPD - Clinically do not suspect COPD exacerbation at this time, symptoms primarily from volume overload -DC antibiotics and steroids  Abnormal urinalysis -Urine cultures are negative, DC ABX    Paroxysmal atrial fibrillation with RVR -Recurrent A-fib RVR today  -back on Amio gtt. today, Eliquis  - Increase metoprolol   Type 2 diabetes mellitus -CBGs uncontrolled, continue glargine, and add meal coverage   Hyperlipidemia Continue Crestor    Hypothyroidism Continue Synthroid .   History of CAD and PAD Continue Crestor  and Toprol -XL. Holding antiplatelet therapy in the setting of GI bleed.   History of gout Holding allopurinol  in the setting of advanced CKD.   History of chronic tobacco smoking Still smokes 1 pack a day.  Continue nicotine  patch.  Counseled patient for smoking cessation   CKD stage IV Baseline serum creatinine appears to be around 2-2.7. On presentation serum creatinine 2.46. Monitor while receiving diuresis.Generalized anxiety disorder   Depression. Acute public encephalopathy. Metabolic workup appears to be unremarkable. Holding Wellbutrin  XL    Ethics: Chronically ill with systolic CHF, advanced COPD, CKD 4, A-fib, recurrent GI bleeds, discussed with patient regarding concern for overall prognosis, palliative care following   DVT prophylaxis: IV heparin  Code Status: DNR,  Family Communication: none present Disposition Plan: SNF  Consultants:    Procedures:   Antimicrobials:    Objective: Vitals:   10/09/24 1055 10/09/24 1100 10/09/24 1110 10/09/24 1137  BP: 97/77 (!) 122/98 114/86 101/87  Pulse: (!) 59 88 (!) 105 (!) 145  Resp: 15 (!) 32  12   Temp:      TempSrc:      SpO2: 95% 96% 96%   Weight:      Height:        Intake/Output Summary (Last 24 hours) at  10/09/2024 1249 Last data filed at 10/09/2024 9177 Gross per 24 hour  Intake 1404.36 ml  Output 2200 ml  Net -795.64 ml   Filed Weights   10/07/24 0500 10/08/24 0657 10/09/24 0400  Weight: 103.4 kg 104 kg 103.4 kg    Examination:  General exam: Appears calm and comfortable no distress HEENT: + JVD Respiratory system: poor air movt, decreased at bases Cardiovascular system: S1 & S2 heard, irregular rhythm Abd: nondistended, soft and nontender.Normal bowel sounds heard. Central nervous system: Alert and oriented. No focal neurological deficits. Extremities: 1plus edema Skin: No rashes Psychiatry:  Mood & affect appropriate.     Data Reviewed:   CBC: Recent Labs  Lab 10/05/24 1032 10/06/24 0117 10/07/24 9391 10/07/24 2251 10/08/24 0218 10/09/24 0228  WBC 17.8* 15.0* 7.7  --  8.1 8.6  NEUTROABS  --  13.8*  --   --   --   --   HGB 8.3* 7.9* 7.1* 8.7* 8.4* 8.6*  HCT 27.2* 26.2* 22.9* 27.6* 26.7* 28.0*  MCV 96.1 96.7 95.8  --  92.4 93.0  PLT 213 182 175  --  184 210   Basic Metabolic Panel: Recent Labs  Lab 10/05/24 0023 10/06/24 0117 10/07/24 0608 10/08/24 0218 10/09/24 0228  NA 138 136 133* 137 138  K 4.5 4.0 3.9 3.9 3.5  CL 98 97* 92* 98 99  CO2 23 27 23 27 29   GLUCOSE 299* 313* 167* 143* 96  BUN 97* 85* 80* 71* 60*  CREATININE 2.41* 2.02* 2.47* 2.35* 1.92*  CALCIUM  8.1* 8.0* 7.9* 8.0* 8.5*  MG 2.5* 2.4 2.4 2.4 2.3   GFR: Estimated Creatinine Clearance: 35.4 mL/min (A) (by C-G formula based on SCr of 1.92 mg/dL (H)). Liver Function Tests: Recent Labs  Lab 10/03/24 1142 10/04/24 0425  AST 17 24  ALT 18 20  ALKPHOS 94 90  BILITOT 0.5 0.6  PROT 6.2* 6.6  ALBUMIN  3.2* 3.4*   Recent Labs  Lab 10/03/24 1142  LIPASE 34   Recent Labs  Lab 10/04/24 1148  AMMONIA 17   Coagulation Profile: Recent Labs  Lab 10/04/24 1148  INR 1.2   Cardiac Enzymes: No results for input(s): CKTOTAL, CKMB, CKMBINDEX, TROPONINI in the last 168  hours. BNP (last 3 results) No results for input(s): PROBNP in the last 8760 hours. HbA1C: No results for input(s): HGBA1C in the last 72 hours. CBG: Recent Labs  Lab 10/08/24 1213 10/08/24 1603 10/08/24 2117 10/09/24 0608 10/09/24 1149  GLUCAP 197* 220* 152* 103* 273*   Lipid Profile: No results for input(s): CHOL, HDL, LDLCALC, TRIG, CHOLHDL, LDLDIRECT in the last 72 hours. Thyroid  Function Tests: No results for input(s): TSH, T4TOTAL, FREET4, T3FREE, THYROIDAB in the last 72 hours.  Anemia Panel: No results for input(s): VITAMINB12, FOLATE, FERRITIN, TIBC, IRON , RETICCTPCT in the last 72 hours.  Urine analysis:    Component Value Date/Time   COLORURINE YELLOW 10/03/2024 2033   APPEARANCEUR HAZY (A) 10/03/2024 2033   LABSPEC 1.011 10/03/2024 2033   PHURINE 5.0 10/03/2024 2033   GLUCOSEU NEGATIVE 10/03/2024 2033   HGBUR NEGATIVE 10/03/2024 2033   BILIRUBINUR NEGATIVE 10/03/2024 2033   KETONESUR NEGATIVE 10/03/2024 2033   PROTEINUR NEGATIVE 10/03/2024 2033   UROBILINOGEN 0.2 09/05/2015 0012   NITRITE  POSITIVE (A) 10/03/2024 2033   LEUKOCYTESUR LARGE (A) 10/03/2024 2033   Sepsis Labs: @LABRCNTIP (procalcitonin:4,lacticidven:4)  ) Recent Results (from the past 240 hours)  Respiratory (~20 pathogens) panel by PCR     Status: None   Collection Time: 10/04/24 12:00 AM   Specimen: Nasopharyngeal Swab; Respiratory  Result Value Ref Range Status   Adenovirus NOT DETECTED NOT DETECTED Final   Coronavirus 229E NOT DETECTED NOT DETECTED Final    Comment: (NOTE) The Coronavirus on the Respiratory Panel, DOES NOT test for the novel  Coronavirus (2019 nCoV)    Coronavirus HKU1 NOT DETECTED NOT DETECTED Final   Coronavirus NL63 NOT DETECTED NOT DETECTED Final   Coronavirus OC43 NOT DETECTED NOT DETECTED Final   Metapneumovirus NOT DETECTED NOT DETECTED Final   Rhinovirus / Enterovirus NOT DETECTED NOT DETECTED Final   Influenza A NOT  DETECTED NOT DETECTED Final   Influenza B NOT DETECTED NOT DETECTED Final   Parainfluenza Virus 1 NOT DETECTED NOT DETECTED Final   Parainfluenza Virus 2 NOT DETECTED NOT DETECTED Final   Parainfluenza Virus 3 NOT DETECTED NOT DETECTED Final   Parainfluenza Virus 4 NOT DETECTED NOT DETECTED Final   Respiratory Syncytial Virus NOT DETECTED NOT DETECTED Final   Bordetella pertussis NOT DETECTED NOT DETECTED Final   Bordetella Parapertussis NOT DETECTED NOT DETECTED Final   Chlamydophila pneumoniae NOT DETECTED NOT DETECTED Final   Mycoplasma pneumoniae NOT DETECTED NOT DETECTED Final    Comment: Performed at [facility], [address], [name], [place] [address]  Urine Culture (for pregnant, neutropenic or urologic patients or patients with an indwelling urinary catheter)     Status: Abnormal   Collection Time: 10/04/24  6:40 AM   Specimen: Urine, Clean Catch  Result Value Ref Range Status   Specimen Description URINE, CLEAN CATCH  Final   Special Requests Normal  Final   Culture (A)  Final    <10,000 COLONIES/mL INSIGNIFICANT GROWTH Performed at [facility], [address], [place], [place] [address]    Report Status 10/05/2024 FINAL  Final  Resp panel by RT-PCR (RSV, Flu A&B, Covid) Anterior Nasal Swab     Status: None   Collection Time: 10/04/24 10:55 AM   Specimen: Anterior Nasal Swab  Result Value Ref Range Status   SARS Coronavirus 2 by RT PCR NEGATIVE NEGATIVE Final   Influenza A by PCR NEGATIVE NEGATIVE Final   Influenza B by PCR NEGATIVE NEGATIVE Final    Comment: (NOTE) The Xpert Xpress SARS-CoV-2/FLU/RSV plus assay is intended as an aid in the diagnosis of influenza from Nasopharyngeal swab specimens and should not be used as a sole basis for treatment. Nasal washings and aspirates are unacceptable for Xpert Xpress SARS-CoV-2/FLU/RSV testing.  Fact Sheet for Patients: bloggercourse.com  Fact Sheet for Healthcare  Providers: seriousbroker.it  This test is not yet approved or cleared by the [place]  FDA and has been authorized for detection and/or diagnosis of SARS-CoV-2 by FDA under an Emergency Use Authorization (EUA). This EUA will remain in effect (meaning this test can be used) for the duration of the COVID-19 declaration under Section 564(b)(1) of the Act, 21 U.S.C. section 360bbb-3(b)(1), unless the authorization is terminated or revoked.     Resp Syncytial Virus by PCR NEGATIVE NEGATIVE Final    Comment: (NOTE) Fact Sheet for Patients: bloggercourse.com  Fact Sheet for Healthcare Providers: seriousbroker.it  This test is not yet approved or cleared by the [place]  FDA and has been authorized for detection  and/or diagnosis of SARS-CoV-2 by FDA under an Emergency Use Authorization (EUA). This EUA will remain in effect (meaning this test can be used) for the duration of the COVID-19 declaration under Section 564(b)(1) of the Act, 21 U.S.C. section 360bbb-3(b)(1), unless the authorization is terminated or revoked.  Performed at [facility], [address], [place], [place] [address]      Radiology Studies: DG CHEST PORT 1 VIEW Result Date: 10/09/2024 EXAM: 1 VIEW(S) XRAY OF THE CHEST 10/09/2024 11:32:28 AM COMPARISON: 10/03/2024 CLINICAL HISTORY: SOB (shortness of breath) FINDINGS: LUNGS AND PLEURA: Lower lung volumes. Increased peribronchial cuffing and interstitial markings with increased right basilar airspace opacities. No pleural effusion. No pneumothorax. HEART AND MEDIASTINUM: Cardiomegaly. Cardiomems device noted. BONES AND SOFT TISSUES: No acute osseous abnormality. IMPRESSION: 1. Findings most consistent with pulmonary edema with increased right basilar airspace opacities, which may be accentuated by low lung volumes. 2. Cardiomegaly with CardioMEMS device. Electronically signed by: Dayne  Hassell MD 10/09/2024 12:35 PM EST RP Workstation: [id number]      Scheduled Meds:  sodium chloride    Intravenous Once   apixaban   5 mg Oral BID   budesonide  (PULMICORT ) nebulizer solution  0.25 mg Nebulization BID   empagliflozin   10 mg Oral Daily   fluticasone   1 spray Each Nare Daily   influenza vac split trivalent PF  0.5 mL Intramuscular Tomorrow-1000   insulin  aspart  0-15 Units Subcutaneous TID WC   insulin  aspart  0-5 Units Subcutaneous QHS   insulin  aspart  5 Units Subcutaneous TID WC   insulin  glargine-yfgn  60 Units Subcutaneous Daily   ipratropium-albuterol   3 mL Nebulization Q6H   lactulose   30 g Oral BID   levothyroxine   50 mcg Oral Q0600   metoprolol  tartrate  25 mg Oral BID   nicotine   14 mg Transdermal Daily   pantoprazole  (PROTONIX ) IV  40 mg Intravenous Q12H   pneumococcal 20-valent conjugate vaccine  0.5 mL Intramuscular Tomorrow-1000   polyethylene glycol  17 g Oral Daily   rosuvastatin   40 mg Oral Daily   senna-docusate  2 tablet Oral BID   sodium chloride  flush  3 mL Intravenous Q12H   torsemide   40 mg Oral BID   Continuous Infusions:  amiodarone  60 mg/hr (10/09/24 1111)   amiodarone      amiodarone        LOS: 6 days    Time spent: 71    Sigurd Pac, MD [facility]   10/09/2024, 12:49 PM

## 2024-10-09 NOTE — Progress Notes (Addendum)
 This chaplain attempted F/U spiritual care visit for notarization of the Pt. Advance Directive.   The medical team is providing care for the Pt. after Rapid Response. The chaplain was able to provide presence and compassionate care.   The chaplain understands the Pt. is expecting her sister-Billie to visit later today. The chaplain will revisit notarizing the Pt. AD at this time.  **[id number] This chaplain is present with the Pt., Billie, notary and witnesses for the notarizing of the Pt. Advance Directive:  HCPOA and Living Will.  The Pt. named Michaelle Hammock as her healthcare agent. If this person is unable or unwilling to serve in this role, the Pt. next choice is Sherron Sharps.  The chaplain gave the Pt. the original AD along with two copies. The chaplain scanned the Pt. AD into the Pt. EMR.  The chaplain is available for F/U spiritual care as needed.  Chaplain Leeroy Hummer [phone]

## 2024-10-09 NOTE — Progress Notes (Signed)
 Occupational Therapy Treatment Patient Details Name: [name] MRN: [id number] DOB: 22-Jan-1961 Today's Date: 10/09/2024   History of present illness Pt is a 63 y.o. female who presented 10/03/24 due to abdominal pain, blood in stool, gastroesophageal reflux and cough with SOB.  Chest xray showed  mild cardiomegaly, pulmonary vascular congestion and bibasilar atelectasis. UA showed evidence of UTI. 11/23 cardioversion. PMH: GI bleed, CAD, HFrEF, CAD, CKD stage IV, carotid stenosis s/p endarterectomy, paroxysmal AFib, PAD, COPD, GERD, smoker, chronic bradycardia, prior substance abuse, gout, HTN, DM type II, HLD, and vitamin D  deficiency   OT comments  Pt presented in chair on RA and agreeable to session. Pt completed transfer to [facility] with CGA and completed peri care with CGA. Pt then wanted to ambulate to sink with close CGA and cues on positioning. Pt then recommended for seated ADLS due to decrease balance while standing post set up. She then ambulated back to chair with CGA. At this time recommendation for [facility] services and Acute care to follow.       If plan is discharge home, recommend the following:  A little help with walking and/or transfers;A little help with bathing/dressing/bathroom   Equipment Recommendations  BSC/3in1    Recommendations for Other Services      Precautions / Restrictions Precautions Precautions: Fall Recall of Precautions/Restrictions: Intact Precaution/Restrictions Comments: watch HR, BP, o2 Restrictions Weight Bearing Restrictions Per Provider Order: No       Mobility Bed Mobility               General bed mobility comments: Pt presented in chair    Transfers Overall transfer level: Needs assistance Equipment used: Rolling walker (2 wheels) Transfers: Sit to/from Stand Sit to Stand: Contact guard assist           General transfer comment: cues on hand placement     Balance Overall balance assessment: Needs  assistance Sitting-balance support: No upper extremity supported, Feet supported Sitting balance-Leahy Scale: Good     Standing balance support: Bilateral upper extremity supported, Single extremity supported Standing balance-Leahy Scale: Fair Standing balance comment: reliant on external support                           ADL either performed or assessed with clinical judgement   ADL Overall ADL's : Needs assistance/impaired Eating/Feeding: Independent;Sitting   Grooming: Wash/dry hands;Set up;Sitting   Upper Body Bathing: Set up;Sitting   Lower Body Bathing: Contact guard assist   Upper Body Dressing : Set up;Sitting   Lower Body Dressing: Contact guard assist;Cueing for sequencing;Cueing for safety   Toilet Transfer: Contact guard assist   Toileting- Clothing Manipulation and Hygiene: Contact guard assist       Functional mobility during ADLs: Contact guard assist;Minimal assistance;Rolling walker (2 wheels);Cueing for sequencing;Cueing for safety      Extremity/Trunk Assessment Upper Extremity Assessment Upper Extremity Assessment: Overall WFL for tasks assessed RUE Deficits / Details: Pt reporting bone on bone shoulder and limited at times due to pain RUE Sensation: WNL RUE Coordination: decreased gross motor   Lower Extremity Assessment Lower Extremity Assessment: Defer to PT evaluation        Vision       Perception     Praxis     Communication Communication Communication: No apparent difficulties   Cognition Arousal: Alert Behavior During Therapy: [facility] for tasks assessed/performed Cognition: No apparent impairments  Following commands: Intact        Cueing   Cueing Techniques: Verbal cues, Visual cues  Exercises      Shoulder Instructions       General Comments Pt on RA in session and went down to 86% but was able to recover into 90s% with seated rest breaks and breathing and noted  more of a decline with coughing    Pertinent Vitals/ Pain       Pain Assessment Pain Assessment: No/denies pain  Home Living                                          Prior Functioning/Environment              Frequency  Min 2X/week        Progress Toward Goals  OT Goals(current goals can now be found in the care plan section)  Progress towards OT goals: Progressing toward goals  Acute Rehab OT Goals Patient Stated Goal: to get home OT Goal Formulation: With patient Time For Goal Achievement: 10/21/24 Potential to Achieve Goals: Fair ADL Goals Pt Will Perform Upper Body Bathing: Independently Pt Will Perform Lower Body Bathing: with modified independence Pt Will Perform Upper Body Dressing: Independently Pt Will Perform Lower Body Dressing: with modified independence Pt Will Transfer to Toilet: with modified independence;ambulating Pt Will Perform Toileting - Clothing Manipulation and hygiene: with modified independence;sit to/from stand;sitting/lateral leans  Plan      Co-evaluation                 AM-PAC OT 6 Clicks Daily Activity     Outcome Measure   Help from another person eating meals?: None Help from another person taking care of personal grooming?: None Help from another person toileting, which includes using toliet, bedpan, or urinal?: A Little Help from another person bathing (including washing, rinsing, drying)?: A Little Help from another person to put on and taking off regular upper body clothing?: None Help from another person to put on and taking off regular lower body clothing?: A Little 6 Click Score: 21    End of Session Equipment Utilized During Treatment: Gait belt;Rolling walker (2 wheels)  OT Visit Diagnosis: Unsteadiness on feet (R26.81);Other abnormalities of gait and mobility (R26.89);Muscle weakness (generalized) (M62.81);Pain   Activity Tolerance Patient tolerated treatment well   Patient Left in  chair;with call bell/phone within reach   Nurse Communication Mobility status        Time: 9091-9057 OT Time Calculation (min): 34 min  Charges: OT General Charges $OT Visit: 1 Visit OT Treatments $Self Care/Home Management : 23-37 mins  Warrick POUR OTR/L  Acute Rehab Services  [phone] office number   Warrick Berber 10/09/2024, 12:49 PM

## 2024-10-09 NOTE — Significant Event (Signed)
 Rapid Response Event Note   Reason for Call :  HR sustained >140  Initial Focused Assessment:  Patient with c/o 10/10 burning chest pain radiating to neck and down right arm--states this happened two days ago. Skin warm and dry. Patient reports nausea (not new and consistent for a few days). Per report pt was sitting up in chair when episode started. Lungs diminished/wheezes. Heart tones irregular/tachy.   138/82 (99) HR 140-170 RR 17 O2 96% 2L [place]  Interventions/Plan of Care:  EKG: RVR Nitroglycerin  x2 unsuccessful  Amio 150mg  bolus given, unsuccessful IV zofran  4mg  MD Fairy to bedside, Amio gtt started   Cards paged 5mg  IV metoprol x1 given, and another 12.5mg  PO ordered Labs CXR Palliative already on board; continued discussion on best course of therapy for patient due to complexity.   Event Summary:  MD Notified: MYRTIS Fairy MD Call Time: 1030 Arrival Time: 1035 End Time: 1120  Tonna Chiquita POUR, RN

## 2024-10-09 NOTE — Progress Notes (Signed)
 Chart review - will follow patient remotely.  No change in recommendations.  Plan to set up a close follow up for patient.

## 2024-10-09 NOTE — Progress Notes (Signed)
 1812: This RN walked into pt room and noticed rhythm change on monitor, appears sinus rhythm with heart rate sustaining in 70's. EKG obtained and placed in chart, MD made aware.  Per verbal of Dr. Fairy, continuing on current amiodarone  infusion rate, PO amiodarone  will be considered tomorrow.

## 2024-10-10 DIAGNOSIS — R7989 Other specified abnormal findings of blood chemistry: Secondary | ICD-10-CM | POA: Diagnosis not present

## 2024-10-10 DIAGNOSIS — I428 Other cardiomyopathies: Secondary | ICD-10-CM | POA: Diagnosis not present

## 2024-10-10 DIAGNOSIS — I480 Paroxysmal atrial fibrillation: Secondary | ICD-10-CM | POA: Diagnosis not present

## 2024-10-10 LAB — BASIC METABOLIC PANEL WITH GFR
Anion gap: 11 (ref 5–15)
BUN: 53 mg/dL — ABNORMAL HIGH (ref 8–23)
CO2: 29 mmol/L (ref 22–32)
Calcium: 8.5 mg/dL — ABNORMAL LOW (ref 8.9–10.3)
Chloride: 98 mmol/L (ref 98–111)
Creatinine, Ser: 1.96 mg/dL — ABNORMAL HIGH (ref 0.44–1.00)
GFR, Estimated: 29 mL/min — ABNORMAL LOW (ref >60)
Glucose, Bld: 87 mg/dL (ref 70–99)
Potassium: 3.5 mmol/L (ref 3.5–5.1)
Sodium: 138 mmol/L (ref 135–145)

## 2024-10-10 LAB — GLUCOSE, CAPILLARY
Glucose-Capillary: 75 mg/dL (ref 70–99)
Glucose-Capillary: 147 mg/dL — ABNORMAL HIGH (ref 70–99)
Glucose-Capillary: 212 mg/dL — ABNORMAL HIGH (ref 70–99)
Glucose-Capillary: 230 mg/dL — ABNORMAL HIGH (ref 70–99)

## 2024-10-10 LAB — CBC
HCT: 28.7 % — ABNORMAL LOW (ref 36.0–46.0)
Hemoglobin: 8.9 g/dL — ABNORMAL LOW (ref 12.0–15.0)
MCH: 28.9 pg (ref 26.0–34.0)
MCHC: 31 g/dL (ref 30.0–36.0)
MCV: 93.2 fL (ref 80.0–100.0)
Platelets: 251 K/uL (ref 150–400)
RBC: 3.08 MIL/uL — ABNORMAL LOW (ref 3.87–5.11)
RDW: 18.7 % — ABNORMAL HIGH (ref 11.5–15.5)
WBC: 8.3 K/uL (ref 4.0–10.5)
nRBC: 0.2 % (ref 0.0–0.2)

## 2024-10-10 MED ORDER — FUROSEMIDE 10 MG/ML IJ SOLN
120.0000 mg | Freq: Two times a day (BID) | INTRAMUSCULAR | Status: DC
  Administered 2024-10-10 – 2024-10-12 (×5): 120 mg via INTRAVENOUS
  Filled 2024-10-10 (×5): qty 12

## 2024-10-10 MED ORDER — METOLAZONE 5 MG PO TABS
5.0000 mg | ORAL_TABLET | Freq: Once | ORAL | Status: AC
  Administered 2024-10-10: 5 mg via ORAL
  Filled 2024-10-10: qty 1

## 2024-10-10 MED ORDER — POTASSIUM CHLORIDE CRYS ER 20 MEQ PO TBCR
40.0000 meq | EXTENDED_RELEASE_TABLET | Freq: Two times a day (BID) | ORAL | Status: AC
  Administered 2024-10-10 (×2): 40 meq via ORAL
  Filled 2024-10-10 (×2): qty 2

## 2024-10-10 NOTE — Plan of Care (Signed)
  Problem: Education: Goal: Ability to describe self-care measures that may prevent or decrease complications (Diabetes Survival Skills Education) will improve Outcome: Progressing   Problem: Coping: Goal: Ability to adjust to condition or change in health will improve Outcome: Progressing   Problem: Fluid Volume: Goal: Ability to maintain a balanced intake and output will improve Outcome: Progressing   Problem: Health Behavior/Discharge Planning: Goal: Ability to manage health-related needs will improve Outcome: Progressing   Problem: Metabolic: Goal: Ability to maintain appropriate glucose levels will improve Outcome: Progressing   Problem: Metabolic: Goal: Ability to maintain appropriate glucose levels will improve Outcome: Progressing   Problem: Tissue Perfusion: Goal: Adequacy of tissue perfusion will improve Outcome: Progressing   Problem: Skin Integrity: Goal: Risk for impaired skin integrity will decrease Outcome: Progressing   Problem: Education: Goal: Knowledge of General Education information will improve Description: Including pain rating scale, medication(s)/side effects and non-pharmacologic comfort measures Outcome: Progressing   Problem: Activity: Goal: Risk for activity intolerance will decrease Outcome: Progressing   Problem: Pain Managment: Goal: General experience of comfort will improve and/or be controlled Outcome: Progressing   Problem: Skin Integrity: Goal: Risk for impaired skin integrity will decrease Outcome: Progressing

## 2024-10-10 NOTE — Progress Notes (Signed)
 Patient now in normal sinus rhythm. EKG complete and Cardiologist Otelia, MD notified. Amiodarone  to continue infusing per MD.

## 2024-10-10 NOTE — Progress Notes (Signed)
 PROGRESS NOTE    [name]  FMW:[id number] DOB: 1961-06-21 DOA: 10/03/2024 PCP: Campbell Reynolds, NP  Chronically ill 62/F with heavy tobacco use, COPD, paroxysmal A-fib, chronic systolic CHF, CAD, PAD, CKD 4, gout, hypothyroidism, recurrent GI bleed presented to the ED with volume overload as well as anemia, melena -GI and cardiology following, no plan for repeat endoscopic eval at this time - Ischemic workup limited, existing CKD - 11/23 overnight, became unstable with A-fib RVR, heart rate in 170s, cardioverted - 11/24, hemoglobin down to 7.1, transfusing, also orthostatic, holding Bidil  - 11/25, discussed with GI, poor candidate for deep enteroscopy, no plans for VCE - 11/26 with recurrent chest pain, severe A-fib RVR, started on Amio gtt., repeat x-ray with persistent pulmonary edema  Subjective: - Feels better today, breathing is improving  Assessment and Plan:  Upper GI bleed with melena. Acute on chronic anemia Presented with epigastric pain and 2 episodes of black tarry stool. Similar presentation in May 2025 at which time EGD and colonoscopy did not show any evidence of active bleeding. Transfused PRBC - Seen by [facility] and discussed again felt to be a poor candidate for deep enteroscopy, hence no plan to pursue VCE  - Eliquis  resumed - Monitor hemoglobin   Acute on chronic HFrEF. Severe mitral valve regurgitation. -Last echo with EF 30-35%, grade 1 DD, normal RV, moderate to severe mitral regurgitation -Improving with diuresis, remains volume overloaded  - Repeat x-ray still with pulmonary edema, limited response to diuretics, increase Lasix  to 120 mg twice daily, continue metoprolol , Jardiance  - GDMT limited by CKD 4 - Cards following, poor candidate for invasive workup, palliative following as well   Elevated troponin. History of CAD. Ongoing chest pain -Troponin peaked to 377, now improving, felt to be demand ischemia, has chronic LBBB -Echo noted worsening  cardiomyopathy as noted above -Cards following, no plan for ischemic workup with CKD 4 -Continue statin, Eliquis    COPD - Clinically do not suspect COPD exacerbation at this time, symptoms primarily from volume overload -DC antibiotics and steroids  Abnormal urinalysis -Urine cultures are negative, DC ABX    Paroxysmal atrial fibrillation with RVR -Recurrent A-fib RVR today  -back on Amio gtt. today, Eliquis  - Increase metoprolol   Type 2 diabetes mellitus -CBGs uncontrolled, continue glargine, and add meal coverage   Hyperlipidemia Continue Crestor    Hypothyroidism Continue Synthroid .   History of CAD and PAD Continue Crestor  and Toprol -XL. Holding antiplatelet therapy in the setting of GI bleed.   History of gout Holding allopurinol  in the setting of advanced CKD.   History of chronic tobacco smoking Still smokes 1 pack a day.  Continue nicotine  patch.  Counseled patient for smoking cessation   CKD stage IV Baseline serum creatinine appears to be around 2-2.7. On presentation serum creatinine 2.46. Monitor while receiving diuresis.Generalized anxiety disorder   Depression. Acute public encephalopathy. Metabolic workup appears to be unremarkable. Holding Wellbutrin  XL    Ethics: Chronically ill with systolic CHF, advanced COPD, CKD 4, A-fib, recurrent GI bleeds, discussed with patient regarding concern for overall prognosis, palliative care following   DVT prophylaxis: Eliquis  Code Status: DNR,  Family Communication: none present, discussed with sister yesterday Disposition Plan: Home with home health services and outpatient palliative care  Consultants:    Procedures:   Antimicrobials:    Objective: Vitals:   10/10/24 0349 10/10/24 0700 10/10/24 0712 10/10/24 0729  BP: 125/61 (!) 141/69    Pulse: 67 70 69   Resp: 16 20 19  Temp: 98.5 F (36.9 C)  97.8 F (36.6 C)   TempSrc: Oral  Oral   SpO2: 90% 100% 99% 95%  Weight: 104 kg     Height:         Intake/Output Summary (Last 24 hours) at 10/10/2024 1011 Last data filed at 10/10/2024 0205 Gross per 24 hour  Intake 149 ml  Output 700 ml  Net -551 ml   Filed Weights   10/08/24 0657 10/09/24 0400 10/10/24 0349  Weight: 104 kg 103.4 kg 104 kg    Examination:  General exam: Appears calm and comfortable no distress HEENT: + JVD Respiratory system: poor air movt, decreased at bases Cardiovascular system: S1 & S2 heard, irregular rhythm Abd: nondistended, soft and nontender.Normal bowel sounds heard. Central nervous system: Alert and oriented. No focal neurological deficits. Extremities: 1plus edema Skin: No rashes Psychiatry:  Mood & affect appropriate.     Data Reviewed:   CBC: Recent Labs  Lab 10/06/24 0117 10/07/24 9391 10/07/24 2251 10/08/24 0218 10/09/24 0228 10/10/24 0233  WBC 15.0* 7.7  --  8.1 8.6 8.3  NEUTROABS 13.8*  --   --   --   --   --   HGB 7.9* 7.1* 8.7* 8.4* 8.6* 8.9*  HCT 26.2* 22.9* 27.6* 26.7* 28.0* 28.7*  MCV 96.7 95.8  --  92.4 93.0 93.2  PLT 182 175  --  184 210 251   Basic Metabolic Panel: Recent Labs  Lab 10/05/24 0023 10/06/24 0117 10/07/24 0608 10/08/24 0218 10/09/24 0228 10/10/24 0233  NA 138 136 133* 137 138 138  K 4.5 4.0 3.9 3.9 3.5 3.5  CL 98 97* 92* 98 99 98  CO2 23 27 23 27 29 29   GLUCOSE 299* 313* 167* 143* 96 87  BUN 97* 85* 80* 71* 60* 53*  CREATININE 2.41* 2.02* 2.47* 2.35* 1.92* 1.96*  CALCIUM  8.1* 8.0* 7.9* 8.0* 8.5* 8.5*  MG 2.5* 2.4 2.4 2.4 2.3  --    GFR: Estimated Creatinine Clearance: 34.7 mL/min (A) (by C-G formula based on SCr of 1.96 mg/dL (H)). Liver Function Tests: Recent Labs  Lab 10/03/24 1142 10/04/24 0425  AST 17 24  ALT 18 20  ALKPHOS 94 90  BILITOT 0.5 0.6  PROT 6.2* 6.6  ALBUMIN  3.2* 3.4*   Recent Labs  Lab 10/03/24 1142  LIPASE 34   Recent Labs  Lab 10/04/24 1148  AMMONIA 17   Coagulation Profile: Recent Labs  Lab 10/04/24 1148  INR 1.2   Cardiac Enzymes: No  results for input(s): CKTOTAL, CKMB, CKMBINDEX, TROPONINI in the last 168 hours. BNP (last 3 results) No results for input(s): PROBNP in the last 8760 hours. HbA1C: No results for input(s): HGBA1C in the last 72 hours. CBG: Recent Labs  Lab 10/09/24 0608 10/09/24 1149 10/09/24 1703 10/09/24 2204 10/10/24 0610  GLUCAP 103* 273* 245* 89 75   Lipid Profile: No results for input(s): CHOL, HDL, LDLCALC, TRIG, CHOLHDL, LDLDIRECT in the last 72 hours. Thyroid  Function Tests: No results for input(s): TSH, T4TOTAL, FREET4, T3FREE, THYROIDAB in the last 72 hours.  Anemia Panel: No results for input(s): VITAMINB12, FOLATE, FERRITIN, TIBC, IRON , RETICCTPCT in the last 72 hours.  Urine analysis:    Component Value Date/Time   COLORURINE YELLOW 10/03/2024 2033   APPEARANCEUR HAZY (A) 10/03/2024 2033   LABSPEC 1.011 10/03/2024 2033   PHURINE 5.0 10/03/2024 2033   GLUCOSEU NEGATIVE 10/03/2024 2033   HGBUR NEGATIVE 10/03/2024 2033   BILIRUBINUR NEGATIVE 10/03/2024 2033   KETONESUR NEGATIVE  10/03/2024 2033   PROTEINUR NEGATIVE 10/03/2024 2033   UROBILINOGEN 0.2 09/05/2015 0012   NITRITE POSITIVE (A) 10/03/2024 2033   LEUKOCYTESUR LARGE (A) 10/03/2024 2033   Sepsis Labs: @LABRCNTIP (procalcitonin:4,lacticidven:4)  ) Recent Results (from the past 240 hours)  Respiratory (~20 pathogens) panel by PCR     Status: None   Collection Time: 10/04/24 12:00 AM   Specimen: Nasopharyngeal Swab; Respiratory  Result Value Ref Range Status   Adenovirus NOT DETECTED NOT DETECTED Final   Coronavirus 229E NOT DETECTED NOT DETECTED Final    Comment: (NOTE) The Coronavirus on the Respiratory Panel, DOES NOT test for the novel  Coronavirus (2019 nCoV)    Coronavirus HKU1 NOT DETECTED NOT DETECTED Final   Coronavirus NL63 NOT DETECTED NOT DETECTED Final   Coronavirus OC43 NOT DETECTED NOT DETECTED Final   Metapneumovirus NOT DETECTED NOT DETECTED Final    Rhinovirus / Enterovirus NOT DETECTED NOT DETECTED Final   Influenza A NOT DETECTED NOT DETECTED Final   Influenza B NOT DETECTED NOT DETECTED Final   Parainfluenza Virus 1 NOT DETECTED NOT DETECTED Final   Parainfluenza Virus 2 NOT DETECTED NOT DETECTED Final   Parainfluenza Virus 3 NOT DETECTED NOT DETECTED Final   Parainfluenza Virus 4 NOT DETECTED NOT DETECTED Final   Respiratory Syncytial Virus NOT DETECTED NOT DETECTED Final   Bordetella pertussis NOT DETECTED NOT DETECTED Final   Bordetella Parapertussis NOT DETECTED NOT DETECTED Final   Chlamydophila pneumoniae NOT DETECTED NOT DETECTED Final   Mycoplasma pneumoniae NOT DETECTED NOT DETECTED Final    Comment: Performed at [facility], [address] [place], [place] [address]  Urine Culture (for pregnant, neutropenic or urologic patients or patients with an indwelling urinary catheter)     Status: Abnormal   Collection Time: 10/04/24  6:40 AM   Specimen: Urine, Clean Catch  Result Value Ref Range Status   Specimen Description URINE, CLEAN CATCH  Final   Special Requests Normal  Final   Culture (A)  Final    <10,000 COLONIES/mL INSIGNIFICANT GROWTH Performed at [facility], [address] [place], [place] [address]    Report Status 10/05/2024 FINAL  Final  Resp panel by RT-PCR (RSV, Flu A&B, Covid) Anterior Nasal Swab     Status: None   Collection Time: 10/04/24 10:55 AM   Specimen: Anterior Nasal Swab  Result Value Ref Range Status   SARS Coronavirus 2 by RT PCR NEGATIVE NEGATIVE Final   Influenza A by PCR NEGATIVE NEGATIVE Final   Influenza B by PCR NEGATIVE NEGATIVE Final    Comment: (NOTE) The Xpert Xpress SARS-CoV-2/FLU/RSV plus assay is intended as an aid in the diagnosis of influenza from Nasopharyngeal swab specimens and should not be used as a sole basis for treatment. Nasal washings and aspirates are unacceptable for Xpert Xpress SARS-CoV-2/FLU/RSV testing.  Fact Sheet for  Patients: bloggercourse.com  Fact Sheet for Healthcare Providers: seriousbroker.it  This test is not yet approved or cleared by the [place]  FDA and has been authorized for detection and/or diagnosis of SARS-CoV-2 by FDA under an Emergency Use Authorization (EUA). This EUA will remain in effect (meaning this test can be used) for the duration of the COVID-19 declaration under Section 564(b)(1) of the Act, 21 U.S.C. section 360bbb-3(b)(1), unless the authorization is terminated or revoked.     Resp Syncytial Virus by PCR NEGATIVE NEGATIVE Final    Comment: (NOTE) Fact Sheet for Patients: bloggercourse.com  Fact Sheet for Healthcare Providers: seriousbroker.it  This test  is not yet approved or cleared by the [place]  FDA and has been authorized for detection and/or diagnosis of SARS-CoV-2 by FDA under an Emergency Use Authorization (EUA). This EUA will remain in effect (meaning this test can be used) for the duration of the COVID-19 declaration under Section 564(b)(1) of the Act, 21 U.S.C. section 360bbb-3(b)(1), unless the authorization is terminated or revoked.  Performed at [facility], [address], [place], [place] [address]      Radiology Studies: DG CHEST PORT 1 VIEW Result Date: 10/09/2024 EXAM: 1 VIEW(S) XRAY OF THE CHEST 10/09/2024 11:32:28 AM COMPARISON: 10/03/2024 CLINICAL HISTORY: SOB (shortness of breath) FINDINGS: LUNGS AND PLEURA: Lower lung volumes. Increased peribronchial cuffing and interstitial markings with increased right basilar airspace opacities. No pleural effusion. No pneumothorax. HEART AND MEDIASTINUM: Cardiomegaly. Cardiomems device noted. BONES AND SOFT TISSUES: No acute osseous abnormality. IMPRESSION: 1. Findings most consistent with pulmonary edema with increased right basilar airspace opacities, which may be accentuated by low  lung volumes. 2. Cardiomegaly with CardioMEMS device. Electronically signed by: Dayne Hassell MD 10/09/2024 12:35 PM EST RP Workstation: [id number]      Scheduled Meds:  sodium chloride    Intravenous Once   apixaban   5 mg Oral BID   budesonide  (PULMICORT ) nebulizer solution  0.25 mg Nebulization BID   empagliflozin   10 mg Oral Daily   furosemide   120 mg Intravenous BID   influenza vac split trivalent PF  0.5 mL Intramuscular Tomorrow-1000   insulin  aspart  0-15 Units Subcutaneous TID WC   insulin  aspart  0-5 Units Subcutaneous QHS   insulin  aspart  5 Units Subcutaneous TID WC   insulin  glargine-yfgn  60 Units Subcutaneous Daily   lactulose   30 g Oral BID   levothyroxine   50 mcg Oral Q0600   metoprolol  tartrate  25 mg Oral BID   nicotine   14 mg Transdermal Daily   pantoprazole  (PROTONIX ) IV  40 mg Intravenous Q12H   pneumococcal 20-valent conjugate vaccine  0.5 mL Intramuscular Tomorrow-1000   polyethylene glycol  17 g Oral Daily   potassium chloride   40 mEq Oral BID   rosuvastatin   40 mg Oral Daily   senna-docusate  2 tablet Oral BID   sodium chloride  flush  3 mL Intravenous Q12H   Continuous Infusions:  amiodarone  30 mg/hr (10/10/24 0046)   amiodarone        LOS: 7 days    Time spent: 59    Sigurd Pac, MD [facility]   10/10/2024, 10:11 AM

## 2024-10-10 NOTE — Progress Notes (Signed)
 Rounding Note   Patient Name: [name] Date of Encounter: 10/10/2024  [facility] Cardiologist: Dorn Lesches, MD   Subjective Patient admitted a week ago with GI bleed which is a recurrent issue.  She has had PAF with chest pain and was cardioverted.  This morning she denies chest pain or shortness of breath.  Scheduled Meds:  sodium chloride    Intravenous Once   apixaban   5 mg Oral BID   budesonide  (PULMICORT ) nebulizer solution  0.25 mg Nebulization BID   empagliflozin   10 mg Oral Daily   fluticasone   1 spray Each Nare Daily   furosemide   120 mg Intravenous BID   influenza vac split trivalent PF  0.5 mL Intramuscular Tomorrow-1000   insulin  aspart  0-15 Units Subcutaneous TID WC   insulin  aspart  0-5 Units Subcutaneous QHS   insulin  aspart  5 Units Subcutaneous TID WC   insulin  glargine-yfgn  60 Units Subcutaneous Daily   lactulose   30 g Oral BID   levothyroxine   50 mcg Oral Q0600   metoprolol  tartrate  25 mg Oral BID   nicotine   14 mg Transdermal Daily   pantoprazole  (PROTONIX ) IV  40 mg Intravenous Q12H   pneumococcal 20-valent conjugate vaccine  0.5 mL Intramuscular Tomorrow-1000   polyethylene glycol  17 g Oral Daily   rosuvastatin   40 mg Oral Daily   senna-docusate  2 tablet Oral BID   sodium chloride  flush  3 mL Intravenous Q12H   Continuous Infusions:  amiodarone  30 mg/hr (10/10/24 0046)   amiodarone      PRN Meds: acetaminophen  **OR** acetaminophen , amiodarone , calcium  carbonate, guaiFENesin -dextromethorphan , nicotine  polacrilex, nitroGLYCERIN , ondansetron  **OR** ondansetron  (ZOFRAN ) IV, oxyCODONE , sodium chloride  flush, sodium chloride  flush   Vital Signs  Vitals:   10/10/24 0349 10/10/24 0700 10/10/24 0712 10/10/24 0729  BP: 125/61 (!) 141/69    Pulse: 67 70 69   Resp: 16 20 19    Temp: 98.5 F (36.9 C)  97.8 F (36.6 C)   TempSrc: Oral  Oral   SpO2: 90% 100% 99% 95%  Weight: 104 kg     Height:        Intake/Output Summary (Last 24  hours) at 10/10/2024 0747 Last data filed at 10/10/2024 0205 Gross per 24 hour  Intake 389 ml  Output 700 ml  Net -311 ml      10/10/2024    3:49 AM 10/09/2024    4:00 AM 10/08/2024    6:57 AM  Last 3 Weights  Weight (lbs) 229 lb 4.5 oz 227 lb 15.3 oz 229 lb 4.5 oz  Weight (kg) 104 kg 103.4 kg 104 kg      Telemetry Sinus rhythm this morning- Personally Reviewed  ECG  Not performed today- Personally Reviewed  Physical Exam  GEN: No acute distress.   Neck: No JVD Cardiac: RRR, no murmurs, rubs, or gallops.  Respiratory: Clear to auscultation bilaterally. GI: Soft, nontender, non-distended  MS: No edema; No deformity. Neuro:  Nonfocal  Psych: Normal affect Extremities: Trace bilateral lower extremity edema  Labs High Sensitivity Troponin:   Recent Labs  Lab 10/05/24 1503 10/06/24 2047 10/06/24 2237 10/09/24 1135 10/09/24 1423  TROPONINIHS 327* 361* 470* 337* 217*     Chemistry Recent Labs  Lab 10/03/24 1142 10/04/24 0425 10/04/24 1651 10/07/24 0608 10/08/24 0218 10/09/24 0228 10/10/24 0233  NA 142 139   < > 133* 137 138 138  K 5.0 4.7   < > 3.9 3.9 3.5 3.5  CL 103 102   < >  92* 98 99 98  CO2 22 24   < > 23 27 29 29   GLUCOSE 210* 315*   < > 167* 143* 96 87  BUN 98* 99*   < > 80* 71* 60* 53*  CREATININE 2.46* 2.45*   < > 2.47* 2.35* 1.92* 1.96*  CALCIUM  8.4* 8.1*   < > 7.9* 8.0* 8.5* 8.5*  MG  --   --    < > 2.4 2.4 2.3  --   PROT 6.2* 6.6  --   --   --   --   --   ALBUMIN  3.2* 3.4*  --   --   --   --   --   AST 17 24  --   --   --   --   --   ALT 18 20  --   --   --   --   --   ALKPHOS 94 90  --   --   --   --   --   BILITOT 0.5 0.6  --   --   --   --   --   GFRNONAA 22* 22*   < > 22* 23* 29* 29*  ANIONGAP 17* 13   < > 18* 12 10 11    < > = values in this interval not displayed.    Lipids No results for input(s): CHOL, TRIG, HDL, LABVLDL, LDLCALC, CHOLHDL in the last 168 hours.  Hematology Recent Labs  Lab 10/08/24 0218  10/09/24 0228 10/10/24 0233  WBC 8.1 8.6 8.3  RBC 2.89* 3.01* 3.08*  HGB 8.4* 8.6* 8.9*  HCT 26.7* 28.0* 28.7*  MCV 92.4 93.0 93.2  MCH 29.1 28.6 28.9  MCHC 31.5 30.7 31.0  RDW 19.9* 19.3* 18.7*  PLT 184 210 251   Thyroid   Recent Labs  Lab 10/04/24 1148  TSH 1.580  FREET4 1.12    BNP Recent Labs  Lab 10/03/24 1142 10/04/24 0000  BNP 386.8* 405.1*    DDimer No results for input(s): DDIMER in the last 168 hours.   Radiology  DG CHEST PORT 1 VIEW Result Date: 10/09/2024 EXAM: 1 VIEW(S) XRAY OF THE CHEST 10/09/2024 11:32:28 AM COMPARISON: 10/03/2024 CLINICAL HISTORY: SOB (shortness of breath) FINDINGS: LUNGS AND PLEURA: Lower lung volumes. Increased peribronchial cuffing and interstitial markings with increased right basilar airspace opacities. No pleural effusion. No pneumothorax. HEART AND MEDIASTINUM: Cardiomegaly. Cardiomems device noted. BONES AND SOFT TISSUES: No acute osseous abnormality. IMPRESSION: 1. Findings most consistent with pulmonary edema with increased right basilar airspace opacities, which may be accentuated by low lung volumes. 2. Cardiomegaly with CardioMEMS device. Electronically signed by: Dayne Hassell MD 10/09/2024 12:35 PM EST RP Workstation: [id number]    Cardiac Studies  2D echocardiogram (10/04/2024)  IMPRESSIONS     1. Global hypokinesis distal septal/ inferior basal akinesis . Left  ventricular ejection fraction, by estimation, is 30 to 35%. The left  ventricle has moderately decreased function. The left ventricle  demonstrates global hypokinesis. The left  ventricular internal cavity size was moderately dilated. Left ventricular  diastolic parameters are consistent with Grade I diastolic dysfunction  (impaired relaxation).   2. Right ventricular systolic function is normal. The right ventricular  size is normal.   3. Left atrial size was moderately dilated.   4. The mitral valve is degenerative. Moderate to severe mitral valve   regurgitation. No evidence of mitral stenosis.   5. Tricuspid valve regurgitation is moderate.   6. The aortic valve is tricuspid.  There is mild calcification of the  aortic valve. Aortic valve regurgitation is trivial. Aortic valve  sclerosis is present, with no evidence of aortic valve stenosis.   7. The inferior vena cava is normal in size with greater than 50%  respiratory variability, suggesting right atrial pressure of 3 mmHg.   Patient Profile    [name] is a 63 y.o. female with a hx of severe PAD, carotid stenosis s/p left CEA, CAD, chronic systolic CHF, paroxysmal atrial fibrillation on anticoagulation, bradycardia, cirrhosis, hypothyroidism, CKD [Baseline ~2], T2DM, OSA not on CPAP and prior substance abuse [cocaine and EOTH] who is being seen 10/05/2024 for the evaluation of elevated troponin at the request of Yetta Blanch MD.    Assessment & Plan   1: Elevated troponin-her troponins have increased from level of 13 up to 377.  I suspect this is a type II non-STEMI related to her A-fib with RVR when she was having chest pain.  Her chest pain resolved with restoration of sinus rhythm.  She does have a history of nonobstructive CAD by cath in 2015.  I do not think this needs to be further worked up at this time.  2: LV dysfunction-EF in the 30 to 35% range with wall motion abnormalities and moderate to severe MR with moderate TR.  She has had LV dysfunction in the past attributed to nonischemic cardiomyopathy followed by Dr. Rolan in the advanced heart failure clinic.  She has had a CardioMEMS implanted in the past.  She is on a beta-blocker as far as GDMT but not on ACE/ARB/Entresto because of chronic renal insufficiency.  At home she denies shortness of breath.  She was volume overloaded on admission and is diuresed over 3 L on IV furosemide .  3: PAF-long history of PAF on Eliquis  and amiodarone  as an outpatient.  She has seen Dr. Inocencio most recently and is followed in the  A-fib clinic.  She did have an episode of A-fib with RVR yesterday and converted on IV amiodarone , currently in sinus rhythm.  She has had recurrent GI bleeds bring into question the risk-benefit ratio of Eliquis .  She has had GI evaluation including endoscopy which was unrevealing and colonoscopy which showed polyps.  Her hemoglobin on admission here was in the low 8 range with felt fell to the low 7 range.  She did have melena on admission.  I am wondering whether she would be a candidate for A-fib ablation plus or minus Watchman device.  4: PAD-status post multiple lower extremity dimensions by myself and vascular surgery beginning back in 2014.  I intervened on her right SFA multiple times.  She has had a right foot ray resection by Dr. Harden, left femoropopliteal bypass grafting and most recently stenting of her right SFA by Dr. Magda 9/24.  5: Essential hypertension-blood pressure this morning 141/69 on beta-blocker.  6: Tobacco abuse-ongoing tobacco abuse recalcitrant to resector modification.  7: Hyperlipidemia-on rosuvastatin    For questions or updates, please contact [facility] Please consult [email or web address] for contact info under       Signed, Dorn Lesches, MD  10/10/2024, 7:47 AM

## 2024-10-11 DIAGNOSIS — R072 Precordial pain: Secondary | ICD-10-CM | POA: Diagnosis not present

## 2024-10-11 DIAGNOSIS — I480 Paroxysmal atrial fibrillation: Secondary | ICD-10-CM | POA: Diagnosis not present

## 2024-10-11 DIAGNOSIS — I5021 Acute systolic (congestive) heart failure: Secondary | ICD-10-CM | POA: Diagnosis not present

## 2024-10-11 DIAGNOSIS — I429 Cardiomyopathy, unspecified: Secondary | ICD-10-CM | POA: Diagnosis not present

## 2024-10-11 DIAGNOSIS — R7989 Other specified abnormal findings of blood chemistry: Secondary | ICD-10-CM | POA: Diagnosis not present

## 2024-10-11 LAB — BASIC METABOLIC PANEL WITH GFR
Anion gap: 12 (ref 5–15)
BUN: 54 mg/dL — ABNORMAL HIGH (ref 8–23)
CO2: 31 mmol/L (ref 22–32)
Calcium: 8.8 mg/dL — ABNORMAL LOW (ref 8.9–10.3)
Chloride: 95 mmol/L — ABNORMAL LOW (ref 98–111)
Creatinine, Ser: 2.16 mg/dL — ABNORMAL HIGH (ref 0.44–1.00)
GFR, Estimated: 25 mL/min — ABNORMAL LOW (ref >60)
Glucose, Bld: 212 mg/dL — ABNORMAL HIGH (ref 70–99)
Potassium: 3.9 mmol/L (ref 3.5–5.1)
Sodium: 138 mmol/L (ref 135–145)

## 2024-10-11 LAB — CBC
HCT: 31.1 % — ABNORMAL LOW (ref 36.0–46.0)
Hemoglobin: 9.4 g/dL — ABNORMAL LOW (ref 12.0–15.0)
MCH: 28.7 pg (ref 26.0–34.0)
MCHC: 30.2 g/dL (ref 30.0–36.0)
MCV: 95.1 fL (ref 80.0–100.0)
Platelets: 247 K/uL (ref 150–400)
RBC: 3.27 MIL/uL — ABNORMAL LOW (ref 3.87–5.11)
RDW: 18.4 % — ABNORMAL HIGH (ref 11.5–15.5)
WBC: 9.2 K/uL (ref 4.0–10.5)
nRBC: 0 % (ref 0.0–0.2)

## 2024-10-11 LAB — GLUCOSE, CAPILLARY
Glucose-Capillary: 159 mg/dL — ABNORMAL HIGH (ref 70–99)
Glucose-Capillary: 201 mg/dL — ABNORMAL HIGH (ref 70–99)
Glucose-Capillary: 226 mg/dL — ABNORMAL HIGH (ref 70–99)
Glucose-Capillary: 253 mg/dL — ABNORMAL HIGH (ref 70–99)

## 2024-10-11 MED ORDER — ADULT MULTIVITAMIN W/MINERALS CH
1.0000 | ORAL_TABLET | Freq: Every day | ORAL | Status: DC
  Administered 2024-10-11 – 2024-10-12 (×2): 1 via ORAL
  Filled 2024-10-11 (×2): qty 1

## 2024-10-11 MED ORDER — AMIODARONE HCL 200 MG PO TABS
200.0000 mg | ORAL_TABLET | Freq: Two times a day (BID) | ORAL | Status: DC
  Administered 2024-10-11 – 2024-10-12 (×3): 200 mg via ORAL
  Filled 2024-10-11 (×3): qty 1

## 2024-10-11 NOTE — Progress Notes (Addendum)
 PROGRESS NOTE    [name]  FMW:[id number] DOB: July 18, 1961 DOA: 10/03/2024 PCP: Campbell Reynolds, NP  Chronically ill 62/F with heavy tobacco use, COPD, paroxysmal A-fib, chronic systolic CHF, CAD, PAD, CKD 4, gout, hypothyroidism, recurrent GI bleed presented to the ED with volume overload as well as anemia, melena -GI and cardiology following, no plan for repeat endoscopic eval at this time - Ischemic workup limited, existing CKD - 11/23 overnight, became unstable with A-fib RVR, heart rate in 170s, cardioverted - 11/24, hemoglobin down to 7.1, transfusing, also orthostatic, holding Bidil  - 11/25, discussed with GI, poor candidate for deep enteroscopy, no plans for VCE - 11/26 with recurrent chest pain, severe A-fib RVR, started on Amio gtt., repeat x-ray with persistent pulmonary edema - 11/27, diuretic dose increased  Subjective: - Feels a little better overall, IV infiltrated in right arm  Assessment and Plan:  Upper GI bleed with melena. Acute on chronic anemia Presented with epigastric pain and 2 episodes of black tarry stool. Similar presentation in May 2025 at which time EGD and colonoscopy did not show any evidence of active bleeding. Transfused PRBC - Seen by [facility] and discussed again felt to be a poor candidate for deep enteroscopy, hence no plan to pursue VCE  - Eliquis  resumed - Hemoglobin relatively stable at this time   Acute on chronic HFrEF. Severe mitral valve regurgitation. -Last echo with EF 30-35%, grade 1 DD, normal RV, moderate to severe mitral regurgitation -Improving with diuresis, remains volume overloaded  - Repeat x-ray still with pulmonary edema, limited response to diuretics, increased to Lasix  to 120 mg twice daily, continue metoprolol , Jardiance  -Transition to oral torsemide  tomorrow if stable - GDMT limited by CKD 4 - Cards following, poor candidate for invasive workup, palliative following as well   Elevated troponin. History of  CAD. Ongoing chest pain -Troponin peaked to 377, now improving, felt to be demand ischemia, has chronic LBBB -Echo noted worsening cardiomyopathy as noted above -Cards following, no plan for ischemic workup with CKD 4 -Continue statin, Eliquis    COPD - Clinically do not suspect COPD exacerbation at this time, symptoms primarily from volume overload -DC antibiotics and steroids  Abnormal urinalysis -Urine cultures are negative, antibiotics discontinued   Paroxysmal atrial fibrillation with RVR -Recurrent A-fib RVR 11/26, started back on Amio gtt., converted back to sinus rhythm - Switch to oral amiodarone  today, continue metoprolol  and Eliquis   Type 2 diabetes mellitus -CBGs uncontrolled, continue glargine, and add meal coverage   Hyperlipidemia Continue Crestor    Hypothyroidism Continue Synthroid .   History of CAD and PAD Continue Crestor  and Toprol -XL. Holding antiplatelet therapy in the setting of GI bleed.   History of gout Holding allopurinol  in the setting of advanced CKD.   History of chronic tobacco smoking Still smokes 1 pack a day.  Continue nicotine  patch.  Counseled patient for smoking cessation   CKD stage IV Baseline serum creatinine appears to be around 2-2.7. On presentation serum creatinine 2.46. Monitor while receiving diuresis.Generalized anxiety disorder   Depression. Acute public encephalopathy. Metabolic workup appears to be unremarkable. Holding Wellbutrin  XL    Ethics: Chronically ill with systolic CHF, advanced COPD, CKD 4, A-fib, recurrent GI bleeds, discussed with patient regarding concern for overall prognosis, palliative care following   DVT prophylaxis: Eliquis  Code Status: DNR,  Family Communication: n Sister at bedside Disposition Plan: Home with home health services and outpatient palliative care  Consultants:    Procedures:   Antimicrobials:    Objective: Vitals:  10/11/24 0420 10/11/24 0700 10/11/24 0739 10/11/24  0851  BP: 132/62 (!) 144/71 (!) 126/50   Pulse: 70 65 71   Resp: 18 14 15    Temp: 98.1 F (36.7 C) 98.5 F (36.9 C) 98.5 F (36.9 C)   TempSrc: Oral Oral Oral   SpO2: 100% 98% 99% 97%  Weight: 101.6 kg     Height:        Intake/Output Summary (Last 24 hours) at 10/11/2024 1009 Last data filed at 10/11/2024 9370 Gross per 24 hour  Intake 800 ml  Output 1600 ml  Net -800 ml   Filed Weights   10/09/24 0400 10/10/24 0349 10/11/24 0420  Weight: 103.4 kg 104 kg 101.6 kg    Examination:  General exam: AO x 3, no distress HEENT: + JVD Respiratory system: Few basilar rales, poor air movement, decreased breath sounds at the bases Cardiovascular system: S1 & S2 heard, irregular rhythm Abd: nondistended, soft and nontender.Normal bowel sounds heard. Central nervous system: Alert and oriented. No focal neurological deficits. Extremities: 1plus edema Skin: No rashes Psychiatry:  Mood & affect appropriate.     Data Reviewed:   CBC: Recent Labs  Lab 10/06/24 0117 10/07/24 9391 10/07/24 2251 10/08/24 0218 10/09/24 0228 10/10/24 0233 10/11/24 0211  WBC 15.0* 7.7  --  8.1 8.6 8.3 9.2  NEUTROABS 13.8*  --   --   --   --   --   --   HGB 7.9* 7.1* 8.7* 8.4* 8.6* 8.9* 9.4*  HCT 26.2* 22.9* 27.6* 26.7* 28.0* 28.7* 31.1*  MCV 96.7 95.8  --  92.4 93.0 93.2 95.1  PLT 182 175  --  184 210 251 247   Basic Metabolic Panel: Recent Labs  Lab 10/05/24 0023 10/06/24 0117 10/07/24 0608 10/08/24 0218 10/09/24 0228 10/10/24 0233 10/11/24 0211  NA 138 136 133* 137 138 138 138  K 4.5 4.0 3.9 3.9 3.5 3.5 3.9  CL 98 97* 92* 98 99 98 95*  CO2 23 27 23 27 29 29 31   GLUCOSE 299* 313* 167* 143* 96 87 212*  BUN 97* 85* 80* 71* 60* 53* 54*  CREATININE 2.41* 2.02* 2.47* 2.35* 1.92* 1.96* 2.16*  CALCIUM  8.1* 8.0* 7.9* 8.0* 8.5* 8.5* 8.8*  MG 2.5* 2.4 2.4 2.4 2.3  --   --    GFR: Estimated Creatinine Clearance: 31.1 mL/min (A) (by C-G formula based on SCr of 2.16 mg/dL (H)). Liver  Function Tests: No results for input(s): AST, ALT, ALKPHOS, BILITOT, PROT, ALBUMIN  in the last 168 hours.  No results for input(s): LIPASE, AMYLASE in the last 168 hours.  Recent Labs  Lab 10/04/24 1148  AMMONIA 17   Coagulation Profile: Recent Labs  Lab 10/04/24 1148  INR 1.2   Cardiac Enzymes: No results for input(s): CKTOTAL, CKMB, CKMBINDEX, TROPONINI in the last 168 hours. BNP (last 3 results) No results for input(s): PROBNP in the last 8760 hours. HbA1C: No results for input(s): HGBA1C in the last 72 hours. CBG: Recent Labs  Lab 10/10/24 0610 10/10/24 1112 10/10/24 1536 10/10/24 2120 10/11/24 0622  GLUCAP 75 147* 212* 230* 159*   Lipid Profile: No results for input(s): CHOL, HDL, LDLCALC, TRIG, CHOLHDL, LDLDIRECT in the last 72 hours. Thyroid  Function Tests: No results for input(s): TSH, T4TOTAL, FREET4, T3FREE, THYROIDAB in the last 72 hours.  Anemia Panel: No results for input(s): VITAMINB12, FOLATE, FERRITIN, TIBC, IRON , RETICCTPCT in the last 72 hours.  Urine analysis:    Component Value Date/Time   COLORURINE YELLOW  10/03/2024 2033   APPEARANCEUR HAZY (A) 10/03/2024 2033   LABSPEC 1.011 10/03/2024 2033   PHURINE 5.0 10/03/2024 2033   GLUCOSEU NEGATIVE 10/03/2024 2033   HGBUR NEGATIVE 10/03/2024 2033   BILIRUBINUR NEGATIVE 10/03/2024 2033   KETONESUR NEGATIVE 10/03/2024 2033   PROTEINUR NEGATIVE 10/03/2024 2033   UROBILINOGEN 0.2 09/05/2015 0012   NITRITE POSITIVE (A) 10/03/2024 2033   LEUKOCYTESUR LARGE (A) 10/03/2024 2033   Sepsis Labs: @LABRCNTIP (procalcitonin:4,lacticidven:4)  ) Recent Results (from the past 240 hours)  Respiratory (~20 pathogens) panel by PCR     Status: None   Collection Time: 10/04/24 12:00 AM   Specimen: Nasopharyngeal Swab; Respiratory  Result Value Ref Range Status   Adenovirus NOT DETECTED NOT DETECTED Final   Coronavirus 229E NOT DETECTED NOT DETECTED  Final    Comment: (NOTE) The Coronavirus on the Respiratory Panel, DOES NOT test for the novel  Coronavirus (2019 nCoV)    Coronavirus HKU1 NOT DETECTED NOT DETECTED Final   Coronavirus NL63 NOT DETECTED NOT DETECTED Final   Coronavirus OC43 NOT DETECTED NOT DETECTED Final   Metapneumovirus NOT DETECTED NOT DETECTED Final   Rhinovirus / Enterovirus NOT DETECTED NOT DETECTED Final   Influenza A NOT DETECTED NOT DETECTED Final   Influenza B NOT DETECTED NOT DETECTED Final   Parainfluenza Virus 1 NOT DETECTED NOT DETECTED Final   Parainfluenza Virus 2 NOT DETECTED NOT DETECTED Final   Parainfluenza Virus 3 NOT DETECTED NOT DETECTED Final   Parainfluenza Virus 4 NOT DETECTED NOT DETECTED Final   Respiratory Syncytial Virus NOT DETECTED NOT DETECTED Final   Bordetella pertussis NOT DETECTED NOT DETECTED Final   Bordetella Parapertussis NOT DETECTED NOT DETECTED Final   Chlamydophila pneumoniae NOT DETECTED NOT DETECTED Final   Mycoplasma pneumoniae NOT DETECTED NOT DETECTED Final    Comment: Performed at [facility], [address] [place], [place] [address]  Urine Culture (for pregnant, neutropenic or urologic patients or patients with an indwelling urinary catheter)     Status: Abnormal   Collection Time: 10/04/24  6:40 AM   Specimen: Urine, Clean Catch  Result Value Ref Range Status   Specimen Description URINE, CLEAN CATCH  Final   Special Requests Normal  Final   Culture (A)  Final    <10,000 COLONIES/mL INSIGNIFICANT GROWTH Performed at [facility], [address], [place], [place] [address]    Report Status 10/05/2024 FINAL  Final  Resp panel by RT-PCR (RSV, Flu A&B, Covid) Anterior Nasal Swab     Status: None   Collection Time: 10/04/24 10:55 AM   Specimen: Anterior Nasal Swab  Result Value Ref Range Status   SARS Coronavirus 2 by RT PCR NEGATIVE NEGATIVE Final   Influenza A by PCR NEGATIVE NEGATIVE Final   Influenza B by PCR NEGATIVE NEGATIVE Final     Comment: (NOTE) The Xpert Xpress SARS-CoV-2/FLU/RSV plus assay is intended as an aid in the diagnosis of influenza from Nasopharyngeal swab specimens and should not be used as a sole basis for treatment. Nasal washings and aspirates are unacceptable for Xpert Xpress SARS-CoV-2/FLU/RSV testing.  Fact Sheet for Patients: bloggercourse.com  Fact Sheet for Healthcare Providers: seriousbroker.it  This test is not yet approved or cleared by the [place]  FDA and has been authorized for detection and/or diagnosis of SARS-CoV-2 by FDA under an Emergency Use Authorization (EUA). This EUA will remain in effect (meaning this test can be used) for the duration of the COVID-19 declaration under Section 564(b)(1) of the Act,  21 U.S.C. section 360bbb-3(b)(1), unless the authorization is terminated or revoked.     Resp Syncytial Virus by PCR NEGATIVE NEGATIVE Final    Comment: (NOTE) Fact Sheet for Patients: bloggercourse.com  Fact Sheet for Healthcare Providers: seriousbroker.it  This test is not yet approved or cleared by the [place]  FDA and has been authorized for detection and/or diagnosis of SARS-CoV-2 by FDA under an Emergency Use Authorization (EUA). This EUA will remain in effect (meaning this test can be used) for the duration of the COVID-19 declaration under Section 564(b)(1) of the Act, 21 U.S.C. section 360bbb-3(b)(1), unless the authorization is terminated or revoked.  Performed at [facility], [address], [place], [place] [address]      Radiology Studies: DG CHEST PORT 1 VIEW Result Date: 10/09/2024 EXAM: 1 VIEW(S) XRAY OF THE CHEST 10/09/2024 11:32:28 AM COMPARISON: 10/03/2024 CLINICAL HISTORY: SOB (shortness of breath) FINDINGS: LUNGS AND PLEURA: Lower lung volumes. Increased peribronchial cuffing and interstitial markings with increased right basilar  airspace opacities. No pleural effusion. No pneumothorax. HEART AND MEDIASTINUM: Cardiomegaly. Cardiomems device noted. BONES AND SOFT TISSUES: No acute osseous abnormality. IMPRESSION: 1. Findings most consistent with pulmonary edema with increased right basilar airspace opacities, which may be accentuated by low lung volumes. 2. Cardiomegaly with CardioMEMS device. Electronically signed by: Dayne Hassell MD 10/09/2024 12:35 PM EST RP Workstation: [id number]      Scheduled Meds:  sodium chloride    Intravenous Once   amiodarone   200 mg Oral BID   apixaban   5 mg Oral BID   budesonide  (PULMICORT ) nebulizer solution  0.25 mg Nebulization BID   furosemide   120 mg Intravenous BID   influenza vac split trivalent PF  0.5 mL Intramuscular Tomorrow-1000   insulin  aspart  0-15 Units Subcutaneous TID WC   insulin  aspart  0-5 Units Subcutaneous QHS   insulin  aspart  5 Units Subcutaneous TID WC   insulin  glargine-yfgn  60 Units Subcutaneous Daily   lactulose   30 g Oral BID   levothyroxine   50 mcg Oral Q0600   metoprolol  tartrate  25 mg Oral BID   multivitamin with minerals  1 tablet Oral Daily   nicotine   14 mg Transdermal Daily   pantoprazole  (PROTONIX ) IV  40 mg Intravenous Q12H   pneumococcal 20-valent conjugate vaccine  0.5 mL Intramuscular Tomorrow-1000   polyethylene glycol  17 g Oral Daily   rosuvastatin   40 mg Oral Daily   senna-docusate  2 tablet Oral BID   sodium chloride  flush  3 mL Intravenous Q12H   Continuous Infusions:     LOS: 8 days    Time spent:    Sigurd Pac, MD [facility]   10/11/2024, 10:09 AM

## 2024-10-11 NOTE — Progress Notes (Signed)
   Palliative Medicine Inpatient Follow Up Note HPI: [name] is a 62/F with PMH of heavy tobacco use, COPD, paroxysmal A-fib, chronic systolic CHF, CAD, PAD, CKD 4, gout, hypothyroidism, recurrent GI bleed presented to the ED with volume overload as well as anemia.   Palliative care has been asked to support goals of care conversations.   Today's Discussion 10/11/2024  *Please note that this is a verbal dictation therefore any spelling or grammatical errors are due to the Dragon Medical One system interpretation.  I reviewed the chart notes including nursing notes from [facility], progress notes from Dr. Fairy and Dr. Sheena. I also reviewed vital signs, nursing flowsheets, medication administrations record, labs BMP - Na 138, K 3.9, Chl 95, CO2 31, Glu 212, BUN 54, Cr 2.16, Ca 8.8, Anion Gap 12, GFR 25 , and imaging CXR w/ opacification(s)/edema.    I met with [name] this morning. She is awake and alert. She shares with me that she is feeling well  from the perspective of her breathing. She has spoken to Dr. Fairy and is hopeful that she will be able to transition home tomorrow.  [name] has completed her HCPOA documents and living will. She was thankful for the support in doing these documents.   Questions and concerns addressed/Palliative Support Provided.   Objective Assessment: Vital Signs Vitals:   10/11/24 0739 10/11/24 0851  BP: (!) 126/50   Pulse: 71   Resp: 15   Temp: 98.5 F (36.9 C)   SpO2: 99% 97%    Intake/Output Summary (Last 24 hours) at 10/11/2024 1200 Last data filed at 10/11/2024 1000 Gross per 24 hour  Intake 800 ml  Output 1850 ml  Net -1050 ml   Last Weight  Most recent update: 10/11/2024  4:20 AM    Weight  101.6 kg (223 lb 15.8 oz)            Gen:  Older Caucasian F chronically ill appearing HEENT: moist mucous membranes CV: Regular rate and irregular rhythm  PULM:  On RA, breathing is even and nonlabored ABD: soft/nontender  EXT:  No edema  Neuro: Alert and oriented x3   SUMMARY OF RECOMMENDATIONS   DNAR/DNI   MOST form completed and placed in [facility]   Appreciate Chaplain help with HCPOA/Living will documents   Trisha would like to allow time for outcomes and treatment is treatable   OP Palliative support requested for discharge   Patient interested in an electric wheelchair - PCP is not in the [facility] system though I was able to call [employer] and endorse this request   Ongoing PMT support as needed ______________________________________________________________________________________ [name] Palliative Medicine Team Team Cell Phone: [phone] Please utilize secure chat with additional questions, if there is no response within 30 minutes please call the above phone number  MDM Moderate   Palliative Medicine Team providers are available by phone from 7am to 7pm daily and can be reached through the team cell phone.  Should this patient require assistance outside of these hours, please call the patient's attending physician.

## 2024-10-11 NOTE — Plan of Care (Signed)
  Problem: Education: Goal: Ability to describe self-care measures that may prevent or decrease complications (Diabetes Survival Skills Education) will improve Outcome: Progressing   Problem: Coping: Goal: Ability to adjust to condition or change in health will improve Outcome: Progressing   Problem: Fluid Volume: Goal: Ability to maintain a balanced intake and output will improve Outcome: Progressing   Problem: Skin Integrity: Goal: Risk for impaired skin integrity will decrease Outcome: Progressing   Problem: Education: Goal: Knowledge of General Education information will improve Description: Including pain rating scale, medication(s)/side effects and non-pharmacologic comfort measures Outcome: Progressing   Problem: Activity: Goal: Risk for activity intolerance will decrease Outcome: Progressing   Problem: Pain Managment: Goal: General experience of comfort will improve and/or be controlled Outcome: Progressing   Problem: Skin Integrity: Goal: Risk for impaired skin integrity will decrease Outcome: Progressing   Problem: Education: Goal: Knowledge of disease or condition will improve Outcome: Progressing   Problem: Respiratory: Goal: Ability to maintain a clear airway will improve Outcome: Progressing

## 2024-10-11 NOTE — Progress Notes (Signed)
 Progress Note  Patient Name: [name] Date of Encounter: 10/11/2024  Primary Cardiologist: Dorn Lesches, MD   Subjective   Patient seen and examined at her bedside.  Telemetry now in sinus rhythm.  Inpatient Medications    Scheduled Meds:  sodium chloride    Intravenous Once   amiodarone   200 mg Oral BID   apixaban   5 mg Oral BID   budesonide  (PULMICORT ) nebulizer solution  0.25 mg Nebulization BID   furosemide   120 mg Intravenous BID   influenza vac split trivalent PF  0.5 mL Intramuscular Tomorrow-1000   insulin  aspart  0-15 Units Subcutaneous TID WC   insulin  aspart  0-5 Units Subcutaneous QHS   insulin  aspart  5 Units Subcutaneous TID WC   insulin  glargine-yfgn  60 Units Subcutaneous Daily   lactulose   30 g Oral BID   levothyroxine   50 mcg Oral Q0600   metoprolol  tartrate  25 mg Oral BID   multivitamin with minerals  1 tablet Oral Daily   nicotine   14 mg Transdermal Daily   pantoprazole  (PROTONIX ) IV  40 mg Intravenous Q12H   pneumococcal 20-valent conjugate vaccine  0.5 mL Intramuscular Tomorrow-1000   polyethylene glycol  17 g Oral Daily   rosuvastatin   40 mg Oral Daily   senna-docusate  2 tablet Oral BID   sodium chloride  flush  3 mL Intravenous Q12H   Continuous Infusions:   PRN Meds: acetaminophen  **OR** acetaminophen , calcium  carbonate, guaiFENesin -dextromethorphan , nicotine  polacrilex, nitroGLYCERIN , ondansetron  **OR** ondansetron  (ZOFRAN ) IV, oxyCODONE , sodium chloride  flush, sodium chloride  flush   Vital Signs    Vitals:   10/11/24 0420 10/11/24 0700 10/11/24 0739 10/11/24 0851  BP: 132/62 (!) 144/71 (!) 126/50   Pulse: 70 65 71   Resp: 18 14 15    Temp: 98.1 F (36.7 C) 98.5 F (36.9 C) 98.5 F (36.9 C)   TempSrc: Oral Oral Oral   SpO2: 100% 98% 99% 97%  Weight: 101.6 kg     Height:        Intake/Output Summary (Last 24 hours) at 10/11/2024 0950 Last data filed at 10/11/2024 9370 Gross per 24 hour  Intake 800 ml  Output 1600 ml   Net -800 ml   Filed Weights   10/09/24 0400 10/10/24 0349 10/11/24 0420  Weight: 103.4 kg 104 kg 101.6 kg    Telemetry     Sinus rhythm - Personally Reviewed  ECG     - Personally Reviewed  Physical Exam    General: Comfortable Head: Atraumatic, normal size  Eyes: PEERLA, EOMI  Neck: Supple, normal JVD Cardiac: Normal S1, S2; RRR; no murmurs, rubs, or gallops Lungs: Clear to auscultation bilaterally Abd: Soft, nontender, no hepatomegaly  Ext: warm, no edema. With noted metatarsal amputation Musculoskeletal: No deformities, BUE and BLE strength normal and equal Skin: Warm and dry, no rashes   Neuro: Alert and oriented to person, place, time, and situation, CNII-XII grossly intact, no focal deficits  Psych: Normal mood and affect   Labs  Chemistry Recent Labs  Lab 10/09/24 0228 10/10/24 0233 10/11/24 0211  NA 138 138 138  K 3.5 3.5 3.9  CL 99 98 95*  CO2 29 29 31   GLUCOSE 96 87 212*  BUN 60* 53* 54*  CREATININE 1.92* 1.96* 2.16*  CALCIUM  8.5* 8.5* 8.8*  GFRNONAA 29* 29* 25*  ANIONGAP 10 11 12     Hematology Recent Labs  Lab 10/09/24 0228 10/10/24 0233 10/11/24 0211  WBC 8.6 8.3 9.2  RBC 3.01* 3.08* 3.27*  HGB  8.6* 8.9* 9.4*  HCT 28.0* 28.7* 31.1*  MCV 93.0 93.2 95.1  MCH 28.6 28.9 28.7  MCHC 30.7 31.0 30.2  RDW 19.3* 18.7* 18.4*  PLT 210 251 247    Cardiac EnzymesNo results for input(s): TROPONINI in the last 168 hours. No results for input(s): TROPIPOC in the last 168 hours.   BNP No results for input(s): BNP, PROBNP in the last 168 hours.    DDimer No results for input(s): DDIMER in the last 168 hours.   Radiology    DG CHEST PORT 1 VIEW Result Date: 10/09/2024 EXAM: 1 VIEW(S) XRAY OF THE CHEST 10/09/2024 11:32:28 AM COMPARISON: 10/03/2024 CLINICAL HISTORY: SOB (shortness of breath) FINDINGS: LUNGS AND PLEURA: Lower lung volumes. Increased peribronchial cuffing and interstitial markings with increased right basilar airspace  opacities. No pleural effusion. No pneumothorax. HEART AND MEDIASTINUM: Cardiomegaly. Cardiomems device noted. BONES AND SOFT TISSUES: No acute osseous abnormality. IMPRESSION: 1. Findings most consistent with pulmonary edema with increased right basilar airspace opacities, which may be accentuated by low lung volumes. 2. Cardiomegaly with CardioMEMS device. Electronically signed by: Katheleen Faes MD 10/09/2024 12:35 PM EST RP Workstation: [id number]    Cardiac Studies   Echo  Patient Profile     63 y.o. female   Assessment & Plan    NSTEMI Precordial chest pain - has resolved  PAF Depressed ejection fraction EF 30 to 35% with Left ventricular wall motion abnormalities Dilated cardiomyopathy Melanotic stool/anemia   In sinus rhythm, will stop the amiodarone  drip and transition to oral amiodarone  200 mg twice daily.  Still no anginal symptoms. No plans for cardiac catheterization at this time.  Status post PRBC transfusion - hemoglobin appropriately adjusted and stable.  Started on eliquis  5 mg BID will continue to monitor hgb. She will also be set up for outpatient workup for watchman as well.   She is currently on Protonix  40 mg twice daily.    Continue her rosuvastatin  40 mg daily.   In terms of GDMT in the setting of her depressed ejection fraction she is on metoprolol  to tartrate which we can transition to succinate prior to discharge, she is on BiDil .  Kidney function is a limiting factor in adjusting for other medications.   Smoking cessation advised.   For questions or updates, please contact [facility] Please consult [email or web address] for contact info under Cardiology/STEMI.      Signed, Bianca Vester, DO  10/11/2024, 9:50 AM

## 2024-10-11 NOTE — Progress Notes (Signed)
 Occupational Therapy Treatment Patient Details Name: [name] MRN: [id number] DOB: July 31, 1961 Today's Date: 10/11/2024   History of present illness Pt is a 63 y.o. female who presented 10/03/24 due to abdominal pain, blood in stool, gastroesophageal reflux and cough with SOB.  Chest xray showed  mild cardiomegaly, pulmonary vascular congestion and bibasilar atelectasis. UA showed evidence of UTI. 11/23 cardioversion. PMH: GI bleed, CAD, HFrEF, CAD, CKD stage IV, carotid stenosis s/p endarterectomy, paroxysmal AFib, PAD, COPD, GERD, smoker, chronic bradycardia, prior substance abuse, gout, HTN, DM type II, HLD, and vitamin D  deficiency   OT comments  Pt progressing toward goals. Overall S with stand step transfers @ RW level , which is what she typically does at home due to her hx of falls. Sisters are able to assist with ADL tasks as needed. Pt primarily uses a wc for mobility which is safest mode of mobility at this time. Discussed HHOT/PT working with her with mobility and to primarily use her wc with her sisters. VSS on 2L. Acute OT to follow to facilitate safe DC home.       If plan is discharge home, recommend the following:  A little help with walking and/or transfers;A little help with bathing/dressing/bathroom   Equipment Recommendations  BSC/3in1    Recommendations for Other Services      Precautions / Restrictions Precautions Precautions: Fall Recall of Precautions/Restrictions: Intact Precaution/Restrictions Comments: watch HR, BP, o2 Restrictions Weight Bearing Restrictions Per Provider Order: No       Mobility Bed Mobility               General bed mobility comments: OOB in chair    Transfers Overall transfer level: Needs assistance Equipment used: Rolling walker (2 wheels) Transfers: Sit to/from Stand Sit to Stand: Supervision                 Balance Overall balance assessment: Needs assistance, History of Falls Sitting-balance support: No  upper extremity supported, Feet supported Sitting balance-Leahy Scale: Good Sitting balance - Comments: able to sit EOB w/out UE support and no signs of loss of balance Postural control: Right lateral lean Standing balance support: Bilateral upper extremity supported, Single extremity supported Standing balance-Leahy Scale: Fair Standing balance comment: reliant on external support                           ADL either performed or assessed with clinical judgement   ADL Overall ADL's : Needs assistance/impaired Eating/Feeding: Independent;Sitting   Grooming: Wash/dry hands;Set up;Sitting   Upper Body Bathing: Set up;Sitting   Lower Body Bathing: Contact guard assist   Upper Body Dressing : Set up;Sitting   Lower Body Dressing: Contact guard assist;Cueing for sequencing;Cueing for safety   Toilet Transfer: Contact guard assist   Toileting- Clothing Manipulation and Hygiene: Contact guard assist       Functional mobility during ADLs: Contact guard assist;Minimal assistance;Rolling walker (2 wheels);Cueing for sequencing;Cueing for safety General ADL Comments: sisters helping with self care    Extremity/Trunk Assessment Upper Extremity Assessment RUE Deficits / Details: Pt reporting bone on bone shoulder and limited at times due to pain RUE Sensation: WNL RUE Coordination: decreased gross motor   Lower Extremity Assessment RLE Deficits / Details: right knee extension 4/5 with all other RLE and LLE 4-/5        Vision       Perception     Praxis     Communication Communication Communication: No  apparent difficulties   Cognition Arousal: Alert Behavior During Therapy: WFL for tasks assessed/performed Cognition: No apparent impairments                               Following commands: Intact        Cueing   Cueing Techniques: Verbal cues, Visual cues  Exercises      Shoulder Instructions       General Comments      Pertinent  Vitals/ Pain       Pain Assessment Pain Assessment: Faces Faces Pain Scale: Hurts a little bit Pain Location: abdomen when she coughs Pain Descriptors / Indicators: Aching Pain Intervention(s): Limited activity within patient's tolerance  Home Living                                          Prior Functioning/Environment              Frequency  Min 2X/week        Progress Toward Goals  OT Goals(current goals can now be found in the care plan section)  Progress towards OT goals: Progressing toward goals  Acute Rehab OT Goals Patient Stated Goal: home OT Goal Formulation: With patient Time For Goal Achievement: 10/21/24 Potential to Achieve Goals: Fair ADL Goals Pt Will Perform Upper Body Bathing: Independently Pt Will Perform Lower Body Bathing: with modified independence Pt Will Perform Upper Body Dressing: Independently Pt Will Perform Lower Body Dressing: with modified independence Pt Will Transfer to Toilet: with modified independence;ambulating Pt Will Perform Toileting - Clothing Manipulation and hygiene: with modified independence;sit to/from stand;sitting/lateral leans  Plan      Co-evaluation                 AM-PAC OT 6 Clicks Daily Activity     Outcome Measure   Help from another person eating meals?: None Help from another person taking care of personal grooming?: None Help from another person toileting, which includes using toliet, bedpan, or urinal?: A Little Help from another person bathing (including washing, rinsing, drying)?: A Little Help from another person to put on and taking off regular upper body clothing?: A Little Help from another person to put on and taking off regular lower body clothing?: A Little 6 Click Score: 20    End of Session Equipment Utilized During Treatment: Gait belt;Rolling walker (2 wheels)  OT Visit Diagnosis: Unsteadiness on feet (R26.81);Other abnormalities of gait and mobility  (R26.89);Muscle weakness (generalized) (M62.81);Pain Pain - Right/Left: Right Pain - part of body: Knee   Activity Tolerance Patient tolerated treatment well   Patient Left in chair;with call bell/phone within reach   Nurse Communication Mobility status        Time: 1542-1600 OT Time Calculation (min): 18 min  Charges: OT General Charges $OT Visit: 1 Visit OT Treatments $Self Care/Home Management : 8-22 mins  Kreg Sink, OT/L   Acute OT Clinical Specialist Acute Rehabilitation Services Pager [phone] Office [phone]   [facility] 10/11/2024, 4:35 PM

## 2024-10-11 NOTE — Progress Notes (Signed)
 Mobility Specialist Progress Note:    10/11/24 1100  Mobility  Activity Turned to back - supine (Ankle Pump, Leg Lifts, Hip Ab/Ad Duction, Heel Slides, Hand ball squeezes)  Level of Assistance Standby assist, set-up cues, supervision of patient - no hands on  Assistive Device None  Range of Motion/Exercises Active Assistive  Activity Response Tolerated fair  Mobility Referral Yes  Mobility visit 1 Mobility  Mobility Specialist Start Time (ACUTE ONLY) 1100  Mobility Specialist Stop Time (ACUTE ONLY) 1109  Mobility Specialist Time Calculation (min) (ACUTE ONLY) 9 min   Received pt laying in bed agreeable to bed mobility session. Nervous to get up after weds event. Pt stating they are feeling better. No c/o any symptoms. Pt able to perform movements well. Some active assist w/ heel slides. Left pt in bed w/ all needs met and family in room.  Venetia Keel Mobility Specialist Please Neurosurgeon or Rehab Office at [phone]

## 2024-10-12 ENCOUNTER — Other Ambulatory Visit (HOSPITAL_COMMUNITY): Payer: Self-pay

## 2024-10-12 DIAGNOSIS — I5023 Acute on chronic systolic (congestive) heart failure: Secondary | ICD-10-CM

## 2024-10-12 LAB — BASIC METABOLIC PANEL WITH GFR
Anion gap: 13 (ref 5–15)
BUN: 58 mg/dL — ABNORMAL HIGH (ref 8–23)
CO2: 33 mmol/L — ABNORMAL HIGH (ref 22–32)
Calcium: 8.9 mg/dL (ref 8.9–10.3)
Chloride: 93 mmol/L — ABNORMAL LOW (ref 98–111)
Creatinine, Ser: 2.1 mg/dL — ABNORMAL HIGH (ref 0.44–1.00)
GFR, Estimated: 26 mL/min — ABNORMAL LOW (ref >60)
Glucose, Bld: 108 mg/dL — ABNORMAL HIGH (ref 70–99)
Potassium: 3.6 mmol/L (ref 3.5–5.1)
Sodium: 139 mmol/L (ref 135–145)

## 2024-10-12 LAB — GLUCOSE, CAPILLARY
Glucose-Capillary: 158 mg/dL — ABNORMAL HIGH (ref 70–99)
Glucose-Capillary: 234 mg/dL — ABNORMAL HIGH (ref 70–99)

## 2024-10-12 MED ORDER — AMIODARONE HCL 200 MG PO TABS
ORAL_TABLET | ORAL | 0 refills | Status: DC
  Filled 2024-10-12: qty 60, 53d supply, fill #0

## 2024-10-12 MED ORDER — METOPROLOL SUCCINATE ER 25 MG PO TB24
25.0000 mg | ORAL_TABLET | Freq: Every day | ORAL | 0 refills | Status: AC
  Filled 2024-10-12: qty 60, 60d supply, fill #0

## 2024-10-12 MED ORDER — INSULIN LISPRO (1 UNIT DIAL) 100 UNIT/ML (KWIKPEN): 8.0000 [IU] | PEN_INJECTOR | Freq: Three times a day (TID) | SUBCUTANEOUS | Status: AC

## 2024-10-12 MED ORDER — PANTOPRAZOLE SODIUM 40 MG PO TBEC
40.0000 mg | DELAYED_RELEASE_TABLET | Freq: Every day | ORAL | 0 refills | Status: AC
  Filled 2024-10-12: qty 30, 30d supply, fill #0

## 2024-10-12 MED ORDER — TORSEMIDE 20 MG PO TABS: 40.0000 mg | ORAL_TABLET | Freq: Two times a day (BID) | ORAL | Status: DC

## 2024-10-12 NOTE — Progress Notes (Signed)
 SATURATION QUALIFICATIONS: (This note is used to comply with regulatory documentation for home oxygen )  Patient Saturations on Room Air at Rest = 92%  Patient Saturations on Room Air while Ambulating = 82%  Patient Saturations on 2 Liters of oxygen  while Ambulating = 91%  Please briefly explain why patient needs home oxygen : Patient oxygen  level dropped while ambulating on room air. Verdel LOISE Shams, RN

## 2024-10-12 NOTE — TOC Transition Note (Addendum)
 Transition of Care [facility]) - Discharge Note   Patient Details  Name: [name] MRN: [id number] Date of Birth: 05-Dec-1960  [facility] CM/SW Contact:  Marval Gell, RN Phone Number: 10/12/2024, 12:31 PM   Clinical Narrative:      Notified patient will need home oxygen , [employer] will deliver to the room, nurse updated     Barriers to Discharge: Continued Medical Work up   Patient Goals and CMS Choice Patient states their goals for this hospitalization and ongoing recovery are:: from home with sister   Choice offered to / list presented to : NA      Discharge Placement                       Discharge Plan and Services Additional resources added to the After Visit Summary for   In-house Referral: NA Discharge Planning Services: CM Consult Post Acute Care Choice: NA          DME Arranged: Oxygen  DME Agency: [employer] Date DME Agency Contacted: 10/12/24 Time DME Agency Contacted: 1231 Representative spoke with at DME Agency: [place] HH Arranged: NA          Social Drivers of Health (SDOH) Interventions SDOH Screenings   Food Insecurity: No Food Insecurity (10/05/2024)  Housing: Low Risk  (10/05/2024)  Transportation Needs: Unmet Transportation Needs (10/05/2024)  Utilities: Not At Risk (10/05/2024)  Alcohol Screen: Low Risk  (01/28/2020)  Depression (PHQ2-9): Low Risk  (02/09/2023)  Financial Resource Strain: Low Risk  (02/09/2023)  Physical Activity: Inactive (02/09/2023)  Social Connections: Unknown (08/21/2023)   Received from [facility]  Stress: No Stress Concern Present (02/09/2023)  Tobacco Use: High Risk (10/03/2024)     Readmission Risk Interventions    10/04/2024    1:51 PM 03/12/2024    4:08 PM 09/27/2023    3:52 PM  Readmission Risk Prevention Plan  Transportation Screening Complete Complete Complete  Medication Review [employer]) Complete Referral to Pharmacy Referral to Pharmacy  PCP or Specialist  appointment within 3-5 days of discharge Complete Complete Complete  HRI or Home Care Consult Complete Complete Complete  SW Recovery Care/Counseling Consult  Complete Complete  Palliative Care Screening Not Applicable Not Applicable Not Applicable  Skilled Nursing Facility Not Applicable Not Applicable Not Applicable

## 2024-10-12 NOTE — Discharge Summary (Addendum)
 Physician Discharge Summary  [name] FMW:[id number] DOB: 12/14/1960 DOA: 10/03/2024  PCP: Campbell Reynolds, NP  Admit date: 10/03/2024 Discharge date: 10/12/2024  Time spent: 45 minutes  Recommendations for Outpatient Follow-up:  Advanced heart failure clinic Dr. Cherrie in 3 weeks Outpatient palliative care referral sent  Discharge Diagnoses:  Principal Problem:   GI bleed Acute on chronic systolic CHF Severe mitral regurgitation CAD Advanced COPD   Melena   Acute cystitis   Paroxysmal atrial fibrillation ([name])   CKD (chronic kidney disease), stage IV ([name])   Essential hypertension   Insulin  dependent type 2 diabetes mellitus ([name])   Peripheral artery disease   Hyperlipidemia   Hypothyroidism   History of CAD (coronary artery disease)   GAD (generalized anxiety disorder)   COPD with acute exacerbation ([name])   History of CEA (carotid endarterectomy)   Gout   Acute on chronic congestive heart failure ([name])   Acute on chronic anemia   Precordial pain   Elevated troponin   Acute HFrEF (heart failure with reduced ejection fraction) ([name])   High risk medication use   Long term current use of antiarrhythmic drug   Chronic kidney disease, stage IV (severe) ([name])   Anemia   Melanotic stools   Cardiomyopathy [facility])   Discharge Condition: Improving  Diet recommendation: Low-sodium, heart healthy  Filed Weights   10/10/24 0349 10/11/24 0420 10/12/24 0527  Weight: 104 kg 101.6 kg 97.8 kg    History of present illness:  Chronically ill 63/F with heavy tobacco use, COPD, paroxysmal A-fib, chronic systolic CHF, CAD, PAD, CKD 4, gout, hypothyroidism, recurrent GI bleed presented to the ED with volume overload as well as anemia, melena -GI and cardiology following, no plan for repeat endoscopic eval at this time - Ischemic workup limited, existing CKD - 11/23 overnight, became unstable with A-fib RVR, heart rate in 170s, cardioverted - 11/24, hemoglobin down to 7.1,  transfusing, also orthostatic, holding Bidil  - 11/25, discussed with GI, poor candidate for deep enteroscopy, no plans for VCE - 11/26 with recurrent chest pain, severe A-fib RVR, started on Amio gtt., repeat x-ray with persistent pulmonary edema - 11/27, diuretic dose increased    Hospital Course:   Upper GI bleed with melena. Acute on chronic anemia Presented with epigastric pain and 2 episodes of black tarry stool. Similar presentation in May 2025 at which time EGD and colonoscopy did not show any evidence of active bleeding. Transfused PRBC - Seen by [facility] and discussed again felt to be a poor candidate for deep enteroscopy, hence no plan to pursue VCE  - No further active bleeding at this time, after discussion of risks and benefits Eliquis  resumed - Hemoglobin relatively stable at this time, continue PPI   Acute on chronic HFrEF. Severe mitral valve regurgitation. -Last echo with EF 30-35%, grade 1 DD, normal RV, moderate to severe mitral regurgitation -Improving with diuresis, remains volume overloaded  - Repeat x-ray still with pulmonary edema, limited response to diuretics, increased to Lasix  to 120 mg twice daily, continue metoprolol , Jardiance  stopped w/ recurrent UTIs -Volume status now improving, transitioned back to home regimen of torsemide  60 Mg in a.m. and 40 mg in p.m. - GDMT limited by CKD 4 - Cards following, poor candidate for invasive workup,  -Overall stable and improving, discharged home in a stable condition, needs follow-up with advanced heart failure clinic, outpatient palliative care referral sent   Elevated troponin. History of CAD. Ongoing chest pain -Troponin peaked to 377, now improving, felt to  be demand ischemia, has chronic LBBB -Echo noted worsening cardiomyopathy as noted above -Cards following, no plan for ischemic workup with CKD 4 -Continue statin, Eliquis    COPD - Clinically do not suspect COPD exacerbation at this time, symptoms  primarily from volume overload this admission -Resumed ICS/LABA, as needed albuterol    Abnormal urinalysis -Urine cultures are negative, antibiotics discontinued   Paroxysmal atrial fibrillation with RVR -Recurrent A-fib RVR 11/26, started back on Amio gtt., converted back to sinus rhythm - Switch to oral amiodarone , continue metoprolol  and Eliquis    Type 2 diabetes mellitus -CBGs uncontrolled, continue glargine, and add meal coverage   Hyperlipidemia Continue Crestor    Hypothyroidism Continue Synthroid .   History of CAD and PAD Continue Crestor  and Toprol -XL. On Eliquis , avoiding antiplatelet therapy   History of gout Holding allopurinol  in the setting of advanced CKD.   History of chronic tobacco smoking Still smokes 1 pack a day.  Continue nicotine  patch.  Counseled   CKD stage IV Baseline serum creatinine appears to be around 2-2.7. Stable   Depression. Acute public encephalopathy. Metabolic workup appears to be unremarkable. Holding Wellbutrin  XL      Ethics: Chronically ill with systolic CHF, advanced COPD, CKD 4, A-fib, recurrent GI bleeds, discussed with patient regarding concern for overall prognosis, consulted, remains DNR, patient wishes to continue current treatment for now, discussed need for hospice down the road as she deteriorates further   Consultations: Cardiology, [place] [facility]  Discharge Exam: Vitals:   10/12/24 0715 10/12/24 0823  BP: 124/66   Pulse: 64   Resp: 13   Temp: 98 F (36.7 C)   SpO2: 100% 99%    Chronically ill, sitting up in bed, AAO x 3 HEENT: No JVD CVS: S1-S2, regular rhythm Lungs: Poor air movement bilaterally, decreased at the bases Abdomen: Soft, nontender, bowel sounds present Remedies: Trace edema  Discharge Instructions   Discharge Instructions     Amb Referral to Palliative Care   Complete by: As directed    Diet - low sodium heart healthy   Complete by: As directed    Increase activity slowly    Complete by: As directed       Allergies as of 10/12/2024       Reactions   Neurontin  [gabapentin ] Nausea And Vomiting, Other (See Comments)   POSSIBLE SHAKING   Lyrica  [pregabalin ] Other (See Comments)   Shaking        Medication List     STOP taking these medications    buPROPion  300 MG 24 hr tablet Commonly known as: WELLBUTRIN  XL   colchicine  0.6 MG tablet   isosorbide -hydrALAZINE  20-37.5 MG tablet Commonly known as: BIDIL    metolazone  2.5 MG tablet Commonly known as: ZAROXOLYN        TAKE these medications    acetaminophen  500 MG tablet Commonly known as: TYLENOL  Take 1,000 mg by mouth every 6 (six) hours as needed for headache (pain).   albuterol  108 (90 Base) MCG/ACT inhaler Commonly known as: VENTOLIN  HFA USE 2 PUFFS BY MOUTH EVERY FOUR HOURS, AS NEEDED, FOR COUGHING/WHEEZING   allopurinol  100 MG tablet Commonly known as: ZYLOPRIM  Take 100 mg by mouth daily.   amiodarone  200 MG tablet Commonly known as: PACERONE  Take 1 tablet (200 mg total) by mouth 2 (two) times daily for 7 days, THEN 1 tablet (200 mg total) daily. Start taking on: October 12, 2024 What changed: See the new instructions.   Baqsimi  One Pack 3 MG/DOSE Powd Generic drug: Glucagon  Place 1 Device into  the nose as needed (Low blood sugar with impaired consciousness).   budesonide -formoterol  160-4.5 MCG/ACT inhaler Commonly known as: Symbicort  Inhale 2 puffs into the lungs 2 (two) times daily.   Dexcom G7 Receiver Devi 1 Device by Does not apply route continuous.   Dexcom G7 Sensor Misc 1 Device by Does not apply route continuous.   docusate sodium  100 MG capsule Commonly known as: COLACE Take 2 capsules (200 mg total) by mouth at bedtime.   Eliquis  5 MG Tabs tablet Generic drug: apixaban  TAKE 1 TABLET (5 MG TOTAL) BY MOUTH 2 (TWO) TIMES DAILY.   FISH OIL PO Take 1 tablet by mouth daily.   folic acid  1 MG tablet Commonly known as: FOLVITE  Take 1 tablet (1 mg total)  by mouth daily.   ICY HOT PRO NO MESS EX Apply 1 Application topically 3 (three) times daily as needed (arms, neuropathy lower extrimity, gout in hand.).   insulin  glargine 100 UNIT/ML injection Commonly known as: LANTUS  Inject 58 Units into the skin at bedtime.   insulin  lispro 100 UNIT/ML KwikPen Commonly known as: HUMALOG  Inject 8 Units into the skin 3 (three) times daily with meals. Plus sliding scale, max dose 70 units /day What changed: how much to take   levothyroxine  50 MCG tablet Commonly known as: SYNTHROID  TAKE 1 TABLET (50 MCG TOTAL) BY MOUTH DAILY BEFORE BREAKFAST (AM)   metoprolol  succinate 25 MG 24 hr tablet Commonly known as: TOPROL -XL Take 1 tablet (25 mg total) by mouth daily. What changed:  medication strength how much to take   Mounjaro  7.5 MG/0.5ML Pen Generic drug: tirzepatide  Inject 7.5 mg into the skin once a week. What changed: when to take this   Nyamyc  powder Generic drug: nystatin  Apply 1 Application topically 2 (two) times daily.   nystatin  cream Commonly known as: MYCOSTATIN  Apply 1 Application topically 2 (two) times daily as needed for dry skin.   pantoprazole  40 MG tablet Commonly known as: PROTONIX  TAKE 1 TABLET (40 MG TOTAL) BY MOUTH DAILY(AM) What changed: Another medication with the same name was added. Make sure you understand how and when to take each.   pantoprazole  40 MG tablet Commonly known as: Protonix  Take 1 tablet (40 mg total) by mouth daily. What changed: You were already taking a medication with the same name, and this prescription was added. Make sure you understand how and when to take each.   polyethylene glycol 17 g packet Commonly known as: MIRALAX  / GLYCOLAX  Take 17 g by mouth daily as needed for moderate constipation.   potassium chloride  SA 20 MEQ tablet Commonly known as: KLOR-CON  M Take 2 tablets (40 mEq total) by mouth every morning AND 1 tablet (20 mEq total) every evening.   rosuvastatin  40 MG  tablet Commonly known as: CRESTOR  Take 1 tablet (40 mg total) by mouth daily.   torsemide  20 MG tablet Commonly known as: DEMADEX  Take 3 tablets (60 mg total) by mouth every morning AND 2 tablets (40 mg total) every evening.   Vitamin D3 50 MCG (2000 UT) capsule Take 1 capsule by mouth every morning.   VITAMIN E PO Take 1 tablet by mouth daily.               Durable Medical Equipment  (From admission, onward)           Start     Ordered   10/12/24 1222  For home use only DME oxygen   Once       Question Answer  Comment  Length of Need Lifetime   Mode or (Route) Nasal cannula   Liters per Minute 2   Frequency Continuous (stationary and portable oxygen  unit needed)   Oxygen  conserving device Yes   Oxygen  delivery system: Gas      10/12/24 1221   10/08/24 1248  For home use only DME Bedside commode  Once       Question:  Patient needs a bedside commode to treat with the following condition  Answer:  Weakness   10/08/24 1248           Allergies  Allergen Reactions   Neurontin  [Gabapentin ] Nausea And Vomiting and Other (See Comments)    POSSIBLE SHAKING   Lyrica  [Pregabalin ] Other (See Comments)    Shaking     Follow-up Information     Campbell Reynolds, NP Follow up on 10/09/2024.   Why: 10:40 for hospital follow up , transport pickup time is 10:20 Contact information: [address] [place] [address] [phone]         [facility] Follow up.   Why: Outpatient Palliative services Contact information: [address] [place]  [address] [phone]        [facility] (DME) Follow up.   Specialty: DME Services Why: Bedside commode Contact information: [address] [employer] [place]  [phone]                 The results of significant diagnostics from this hospitalization (including imaging, microbiology, ancillary and laboratory) are listed below for  reference.    Significant Diagnostic Studies: DG CHEST PORT 1 VIEW Result Date: 10/09/2024 EXAM: 1 VIEW(S) XRAY OF THE CHEST 10/09/2024 11:32:28 AM COMPARISON: 10/03/2024 CLINICAL HISTORY: SOB (shortness of breath) FINDINGS: LUNGS AND PLEURA: Lower lung volumes. Increased peribronchial cuffing and interstitial markings with increased right basilar airspace opacities. No pleural effusion. No pneumothorax. HEART AND MEDIASTINUM: Cardiomegaly. Cardiomems device noted. BONES AND SOFT TISSUES: No acute osseous abnormality. IMPRESSION: 1. Findings most consistent with pulmonary edema with increased right basilar airspace opacities, which may be accentuated by low lung volumes. 2. Cardiomegaly with CardioMEMS device. Electronically signed by: Dayne Hassell MD 10/09/2024 12:35 PM EST RP Workstation: [id number]   DG Abd 1 View Result Date: 10/06/2024 EXAM: 1 VIEW XRAY OF THE ABDOMEN 10/06/2024 07:01:31 AM COMPARISON: CT abdomen and pelvis 04/18/2022. CLINICAL HISTORY: 63 year old female with abdominal pain. FINDINGS: BOWEL: Moderate colonic stool burden. Nonobstructive bowel gas pattern. SOFT TISSUES: Vascular calcifications. No opaque urinary calculi. BONES: No acute osseous abnormality. IMPRESSION: 1. Nonobstructive bowel gas pattern with moderate colonic stool burden. Electronically signed by: Helayne Hurst MD 10/06/2024 07:19 AM EST RP Workstation: [id number]   ECHOCARDIOGRAM COMPLETE Result Date: 10/04/2024    ECHOCARDIOGRAM REPORT   Patient Name:   [name] Date of Exam: 10/04/2024 Medical Rec #:  [id number]     Height:       63.0 in Accession #:    [id number]    Weight:       202.0 lb Date of Birth:  1961-10-22    BSA:          1.942 m Patient Age:    62 years      BP:           128/95 mmHg Patient Gender: F             HR:           100 bpm. Exam Location:  Inpatient Procedure: 2D Echo and Intracardiac  Opacification Agent (Both Spectral and Color            Flow Doppler were utilized during  procedure). Indications:    CHF  History:        Patient has prior history of Echocardiogram examinations. CHF,                 COPD; Risk Factors:Hypertension.  Sonographer:    Charmaine Gaskins Referring Phys: [id number] SUBRINA SUNDIL IMPRESSIONS  1. Global hypokinesis distal septal/ inferior basal akinesis . Left ventricular ejection fraction, by estimation, is 30 to 35%. The left ventricle has moderately decreased function. The left ventricle demonstrates global hypokinesis. The left ventricular internal cavity size was moderately dilated. Left ventricular diastolic parameters are consistent with Grade I diastolic dysfunction (impaired relaxation).  2. Right ventricular systolic function is normal. The right ventricular size is normal.  3. Left atrial size was moderately dilated.  4. The mitral valve is degenerative. Moderate to severe mitral valve regurgitation. No evidence of mitral stenosis.  5. Tricuspid valve regurgitation is moderate.  6. The aortic valve is tricuspid. There is mild calcification of the aortic valve. Aortic valve regurgitation is trivial. Aortic valve sclerosis is present, with no evidence of aortic valve stenosis.  7. The inferior vena cava is normal in size with greater than 50% respiratory variability, suggesting right atrial pressure of 3 mmHg. FINDINGS  Left Ventricle: Global hypokinesis distal septal/ inferior basal akinesis. Left ventricular ejection fraction, by estimation, is 30 to 35%. The left ventricle has moderately decreased function. The left ventricle demonstrates global hypokinesis. Definity  contrast agent was given IV to delineate the left ventricular endocardial borders. Strain was performed and the global longitudinal strain is indeterminate. The left ventricular internal cavity size was moderately dilated. There is no left ventricular hypertrophy. Left ventricular diastolic parameters are consistent with Grade I diastolic dysfunction (impaired relaxation). Right Ventricle:  The right ventricular size is normal. No increase in right ventricular wall thickness. Right ventricular systolic function is normal. Left Atrium: Left atrial size was moderately dilated. Right Atrium: Right atrial size was normal in size. Pericardium: There is no evidence of pericardial effusion. Mitral Valve: The mitral valve is degenerative in appearance. There is mild thickening of the mitral valve leaflet(s). There is mild calcification of the mitral valve leaflet(s). Mild mitral annular calcification. Moderate to severe mitral valve regurgitation. No evidence of mitral valve stenosis. MV peak gradient, 6.6 mmHg. The mean mitral valve gradient is 3.0 mmHg. Tricuspid Valve: The tricuspid valve is normal in structure. Tricuspid valve regurgitation is moderate . No evidence of tricuspid stenosis. Aortic Valve: The aortic valve is tricuspid. There is mild calcification of the aortic valve. Aortic valve regurgitation is trivial. Aortic valve sclerosis is present, with no evidence of aortic valve stenosis. Pulmonic Valve: The pulmonic valve was normal in structure. Pulmonic valve regurgitation is not visualized. No evidence of pulmonic stenosis. Aorta: The aortic root is normal in size and structure. Venous: The inferior vena cava is normal in size with greater than 50% respiratory variability, suggesting right atrial pressure of 3 mmHg. IAS/Shunts: No atrial level shunt detected by color flow Doppler. Additional Comments: 3D was performed not requiring image post processing on an independent workstation and was indeterminate.  LEFT VENTRICLE PLAX 2D LVIDd:         6.30 cm      Diastology LVIDs:         5.30 cm      LV e' medial:    4.90 cm/s  LV PW:         0.90 cm      LV E/e' medial:  21.4 LV IVS:        1.00 cm      LV e' lateral:   10.00 cm/s LVOT diam:     2.10 cm      LV E/e' lateral: 10.5 LVOT Area:     3.46 cm  LV Volumes (MOD) LV vol d, MOD A2C: 207.0 ml LV vol d, MOD A4C: 216.0 ml LV vol s, MOD A2C: 145.0  ml LV vol s, MOD A4C: 171.0 ml LV SV MOD A2C:     62.0 ml LV SV MOD A4C:     216.0 ml LV SV MOD BP:      58.0 ml RIGHT VENTRICLE RV Basal diam:  3.50 cm RV Mid diam:    3.20 cm RV S prime:     11.00 cm/s LEFT ATRIUM              Index        RIGHT ATRIUM           Index LA diam:        4.30 cm  2.21 cm/m   RA Area:     20.60 cm LA Vol (A2C):   79.8 ml  41.10 ml/m  RA Volume:   63.10 ml  32.49 ml/m LA Vol (A4C):   104.0 ml 53.56 ml/m LA Biplane Vol: 93.6 ml  48.20 ml/m   AORTA Ao Root diam: 3.00 cm Ao Asc diam:  3.50 cm MITRAL VALVE                TRICUSPID VALVE MV Area (PHT): 5.70 cm     TR Peak grad:   69.2 mmHg MV Peak grad:  6.6 mmHg     TR Vmax:        416.00 cm/s MV Mean grad:  3.0 mmHg MV Vmax:       1.28 m/s     SHUNTS MV Vmean:      77.1 cm/s    Systemic Diam: 2.10 cm MV Decel Time: 133 msec MV E velocity: 105.00 cm/s MV A velocity: 66.00 cm/s MV E/A ratio:  1.59 Maude Emmer MD Electronically signed by Maude Emmer MD Signature Date/Time: 10/04/2024/11:16:58 AM    Final    DG Chest Portable 1 View Result Date: 10/03/2024 CLINICAL DATA:  Shortness of breath with abdominal pain and bloody stool. EXAM: PORTABLE CHEST 1 VIEW COMPARISON:  April 24, 2024 FINDINGS: The cardiac silhouette is mildly enlarged and unchanged in size. There is mild prominence of the pulmonary vasculature. Mild atelectasis is suspected within the right lung base. No acute infiltrate, pleural effusion or pneumothorax is identified. The visualized skeletal structures are unremarkable. IMPRESSION: 1. Mild cardiomegaly and mild pulmonary vascular congestion. 2. Mild right basilar atelectasis. Electronically Signed   By: Suzen Dials M.D.   On: 10/03/2024 21:09    Microbiology: Recent Results (from the past 240 hours)  Respiratory (~20 pathogens) panel by PCR     Status: None   Collection Time: 10/04/24 12:00 AM   Specimen: Nasopharyngeal Swab; Respiratory  Result Value Ref Range Status   Adenovirus NOT DETECTED NOT  DETECTED Final   Coronavirus 229E NOT DETECTED NOT DETECTED Final    Comment: (NOTE) The Coronavirus on the Respiratory Panel, DOES NOT test for the novel  Coronavirus (2019 nCoV)    Coronavirus HKU1 NOT DETECTED NOT DETECTED Final   Coronavirus NL63  NOT DETECTED NOT DETECTED Final   Coronavirus OC43 NOT DETECTED NOT DETECTED Final   Metapneumovirus NOT DETECTED NOT DETECTED Final   Rhinovirus / Enterovirus NOT DETECTED NOT DETECTED Final   Influenza A NOT DETECTED NOT DETECTED Final   Influenza B NOT DETECTED NOT DETECTED Final   Parainfluenza Virus 1 NOT DETECTED NOT DETECTED Final   Parainfluenza Virus 2 NOT DETECTED NOT DETECTED Final   Parainfluenza Virus 3 NOT DETECTED NOT DETECTED Final   Parainfluenza Virus 4 NOT DETECTED NOT DETECTED Final   Respiratory Syncytial Virus NOT DETECTED NOT DETECTED Final   Bordetella pertussis NOT DETECTED NOT DETECTED Final   Bordetella Parapertussis NOT DETECTED NOT DETECTED Final   Chlamydophila pneumoniae NOT DETECTED NOT DETECTED Final   Mycoplasma pneumoniae NOT DETECTED NOT DETECTED Final    Comment: Performed at [facility], [address] [place], [place] [address]  Urine Culture (for pregnant, neutropenic or urologic patients or patients with an indwelling urinary catheter)     Status: Abnormal   Collection Time: 10/04/24  6:40 AM   Specimen: Urine, Clean Catch  Result Value Ref Range Status   Specimen Description URINE, CLEAN CATCH  Final   Special Requests Normal  Final   Culture (A)  Final    <10,000 COLONIES/mL INSIGNIFICANT GROWTH Performed at [facility], [address], [place], [place] [address]    Report Status 10/05/2024 FINAL  Final  Resp panel by RT-PCR (RSV, Flu A&B, Covid) Anterior Nasal Swab     Status: None   Collection Time: 10/04/24 10:55 AM   Specimen: Anterior Nasal Swab  Result Value Ref Range Status   SARS Coronavirus 2 by RT PCR NEGATIVE NEGATIVE Final   Influenza A by PCR NEGATIVE NEGATIVE  Final   Influenza B by PCR NEGATIVE NEGATIVE Final    Comment: (NOTE) The Xpert Xpress SARS-CoV-2/FLU/RSV plus assay is intended as an aid in the diagnosis of influenza from Nasopharyngeal swab specimens and should not be used as a sole basis for treatment. Nasal washings and aspirates are unacceptable for Xpert Xpress SARS-CoV-2/FLU/RSV testing.  Fact Sheet for Patients: bloggercourse.com  Fact Sheet for Healthcare Providers: seriousbroker.it  This test is not yet approved or cleared by the [place]  FDA and has been authorized for detection and/or diagnosis of SARS-CoV-2 by FDA under an Emergency Use Authorization (EUA). This EUA will remain in effect (meaning this test can be used) for the duration of the COVID-19 declaration under Section 564(b)(1) of the Act, 21 U.S.C. section 360bbb-3(b)(1), unless the authorization is terminated or revoked.     Resp Syncytial Virus by PCR NEGATIVE NEGATIVE Final    Comment: (NOTE) Fact Sheet for Patients: bloggercourse.com  Fact Sheet for Healthcare Providers: seriousbroker.it  This test is not yet approved or cleared by the [place]  FDA and has been authorized for detection and/or diagnosis of SARS-CoV-2 by FDA under an Emergency Use Authorization (EUA). This EUA will remain in effect (meaning this test can be used) for the duration of the COVID-19 declaration under Section 564(b)(1) of the Act, 21 U.S.C. section 360bbb-3(b)(1), unless the authorization is terminated or revoked.  Performed at [facility], [address], [place], [place] [address]      Labs: Basic Metabolic Panel: Recent Labs  Lab 10/06/24 0117 10/07/24 9391 10/08/24 0218 10/09/24 0228 10/10/24 0233 10/11/24 0211 10/12/24 0213  NA 136 133* 137 138 138 138 139  K 4.0 3.9 3.9 3.5 3.5 3.9 3.6  CL 97*  92* 98 99 98 95* 93*  CO2 27 23 27 29 29  31  33*  GLUCOSE 313* 167* 143* 96 87 212* 108*  BUN 85* 80* 71* 60* 53* 54* 58*  CREATININE 2.02* 2.47* 2.35* 1.92* 1.96* 2.16* 2.10*  CALCIUM  8.0* 7.9* 8.0* 8.5* 8.5* 8.8* 8.9  MG 2.4 2.4 2.4 2.3  --   --   --    Liver Function Tests: No results for input(s): AST, ALT, ALKPHOS, BILITOT, PROT, ALBUMIN  in the last 168 hours. No results for input(s): LIPASE, AMYLASE in the last 168 hours. No results for input(s): AMMONIA in the last 168 hours. CBC: Recent Labs  Lab 10/06/24 0117 10/07/24 0608 10/07/24 2251 10/08/24 0218 10/09/24 0228 10/10/24 0233 10/11/24 0211  WBC 15.0* 7.7  --  8.1 8.6 8.3 9.2  NEUTROABS 13.8*  --   --   --   --   --   --   HGB 7.9* 7.1* 8.7* 8.4* 8.6* 8.9* 9.4*  HCT 26.2* 22.9* 27.6* 26.7* 28.0* 28.7* 31.1*  MCV 96.7 95.8  --  92.4 93.0 93.2 95.1  PLT 182 175  --  184 210 251 247   Cardiac Enzymes: No results for input(s): CKTOTAL, CKMB, CKMBINDEX, TROPONINI in the last 168 hours. BNP: BNP (last 3 results) Recent Labs    08/19/24 1232 10/03/24 1142 10/04/24 0000  BNP 418.1* 386.8* 405.1*    ProBNP (last 3 results) No results for input(s): PROBNP in the last 8760 hours.  CBG: Recent Labs  Lab 10/11/24 1106 10/11/24 1638 10/11/24 2116 10/12/24 0618 10/12/24 1136  GLUCAP 201* 226* 253* 158* 234*       Signed:  Sigurd Pac MD.  [facility] 10/12/2024, 12:23 PM

## 2024-10-12 NOTE — Plan of Care (Signed)
  Problem: Education: Goal: Ability to describe self-care measures that may prevent or decrease complications (Diabetes Survival Skills Education) will improve Outcome: Progressing   Problem: Coping: Goal: Ability to adjust to condition or change in health will improve Outcome: Progressing   Problem: Fluid Volume: Goal: Ability to maintain a balanced intake and output will improve Outcome: Progressing   Problem: Metabolic: Goal: Ability to maintain appropriate glucose levels will improve Outcome: Progressing   Problem: Skin Integrity: Goal: Risk for impaired skin integrity will decrease Outcome: Progressing   Problem: Tissue Perfusion: Goal: Adequacy of tissue perfusion will improve Outcome: Progressing   

## 2024-10-14 ENCOUNTER — Other Ambulatory Visit (HOSPITAL_COMMUNITY): Payer: Self-pay

## 2024-10-14 LAB — VITAMIN B1: Vitamin B1 (Thiamine): 149 nmol/L (ref 66.5–200.0)

## 2024-10-14 NOTE — Progress Notes (Signed)
 Paramedicine Encounter    Patient ID: [name], female    DOB: 05-Jan-1961, 63 y.o.   MRN: [id number]   Complaints-feet pain, legs cramping  Edema-yes to legs--legs are reddened  Compliance with meds-yes  Pill box filled-yes If so, by whom-paramedic  Refills needed-  Amio Humulog  Mounjaro   Potassium   Pt has been in hosp since 11/20 with GI bleed. Just got home yesterday.   Med changes- amio Insulin , metorolol and pantoprazole  per chart on how to take and also to stop colchicine , bidil  and metolazone .  Wellbutrin ??   She came home with palliative referral and a MOST form.   She reports she does feel better. She denies increased sob, no dizziness, no c/p. Pts pcp has sent her some bluetooth b/p cuff, scales, hand weights and sp02 for her to monitor at home.  She is trying to set it up but doesn't know her wifi password,  Her sister has it but she is not home right now.  She did take some meds with the adjustments she thought was right.  Denies black stool since she been home.  Appetite ok.    Pharm did not bring out her Humalog  so reached out to pharm for that.   Her amio was increased to 200mg  BID for 7 days and then after that back to daily.   She also was sent home with 02.   Not sure what needs to be done with her wellbutrin . Her d/c paper its on the list, but on the epic d/c summary it says to stop it.   She has appoint with PCP this wed so I wrote a note on her d/c med list asking about the wellbutrin  and the folic acid .   Meds verified and pill box refilled as reflected in her chart except for the wellbutrin --it is not in her pill box as there is discrepancy with those directions.   She is sitting in her recliner and the foot rest is 2 pieces that are connected, so she has indention to both high calf/knee  areas where the space is in between the foot rests. So I placed a pillow in that area to make it more one piece so she doesn't have that.  Advised her to  monitor her legs and if it gets worse to call PCP to see if she needs to be seen sooner.    BP (!) 102/56   Pulse 63   Resp 17   Wt 222 lb (100.7 kg)   LMP  (LMP Unknown)   SpO2 98%   BMI 39.33 kg/m  Weight yesterday-at hosp  Last visit weight-232  Patient Care Team: Campbell Reynolds, NP as PCP - General Court Dorn PARAS, MD as PCP - Cardiology (Cardiology) Rolan Ezra GORMAN, MD as PCP - Advanced Heart Failure (Cardiology) Inocencio Soyla Lunger, MD as PCP - Electrophysiology (Cardiology) Court Dorn PARAS, MD as Consulting Physician (Cardiology) Darlean Ozell NOVAK, MD as Consulting Physician (Pulmonary Disease) System, Provider Not In  Patient Active Problem List   Diagnosis Date Noted   Precordial pain 10/05/2024   Elevated troponin 10/05/2024   Acute HFrEF (heart failure with reduced ejection fraction) ([name]) 10/05/2024   High risk medication use 10/05/2024   Long term current use of antiarrhythmic drug 10/05/2024   Chronic kidney disease, stage IV (severe) ([name]) 10/05/2024   Anemia 10/05/2024   Melanotic stools 10/05/2024   Cardiomyopathy ([name]) 10/05/2024   Acute on chronic congestive heart failure ([name]) 10/04/2024   Acute on  chronic anemia 10/04/2024   COPD with acute exacerbation ([name]) 10/03/2024   Acute cystitis 10/03/2024   History of CEA (carotid endarterectomy) 10/03/2024   Gout 10/03/2024   Anemia of chronic renal failure 08/06/2024   Persistent atrial fibrillation ([name]) 03/15/2024   Benign neoplasm of ascending colon 03/14/2024   Benign neoplasm of transverse colon 03/14/2024   Benign neoplasm of descending colon 03/14/2024   Benign neoplasm of sigmoid colon 03/14/2024   Chronic systolic congestive heart failure ([name]) 03/12/2024   Melena 03/12/2024   Heme positive stool 03/11/2024   GI bleed 03/08/2024   History of CAD (coronary artery disease) 03/08/2024   CKD (chronic kidney disease), stage IV ([name]) 03/08/2024   Continuous dependence on cigarette smoking  03/08/2024   GAD (generalized anxiety disorder) 03/08/2024   HFrEF (heart failure with reduced ejection fraction) ([name]) 10/11/2023   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) and diastolic (congestive) heart failure ([name]) 10/10/2023   Pre-ulcerative calluses 04/25/2022   PAF (paroxysmal atrial fibrillation) ([name]) 05/05/2021   COPD (chronic obstructive pulmonary disease) ([name]) 05/05/2021   OSA (obstructive sleep apnea) 05/05/2021   Stage 3b chronic kidney disease ([name]) 05/05/2021   Pressure injury of skin 05/04/2021   Diabetic nephropathy ([name]) 01/02/2021   Low back pain 06/01/2020   Hyperlipidemia 04/03/2020   Peripheral artery disease 12/26/2019   NICM (nonischemic cardiomyopathy) ([name]) 06/20/2019   Non-healing ulcer ([name]) 06/20/2019   Acute CHF (congestive heart failure) ([name]) 11/26/2018   Coagulation disorder 08/09/2017   Depression 07/21/2017   At risk for adverse drug reaction 06/20/2017   Peripheral neuropathy 06/20/2017   S/P transmetatarsal amputation of foot, right ([name]) 06/05/2017   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) 05/19/2017   Obesity, class 2 02/24/2016   Anticoagulation management encounter 02/10/2016   Chronic sinus bradycardia 01/12/2016   Essential hypertension 12/22/2015   Demand ischemia ([name])    Acute on chronic diastolic CHF (congestive heart failure) ([name])    Symptomatic anemia 11/08/2015   Hypokalemia 11/08/2015   Tobacco abuse 10/23/2015   Coronary artery disease    DOE (dyspnea on exertion) 04/29/2015   Paroxysmal atrial fibrillation ([name]) 01/16/2015   Carotid artery stenosis 01/16/2015   Insomnia 02/03/2014   S/P peripheral artery angioplasty - TurboHawk atherectomy; R SFA 09/11/2013    Class: Acute   Leg pain, bilateral 08/19/2013   Hypothyroidism 07/31/2013   History of cocaine abuse ([name]) 06/13/2013   Long term current use of anticoagulant therapy 05/20/2013   Alcohol abuse     Narcotic abuse ([name])    Marijuana abuse    Alcoholic cirrhosis ([name])    Insulin  dependent type 2 diabetes mellitus ([name])     Current Outpatient Medications:    acetaminophen  (TYLENOL ) 500 MG tablet, Take 1,000 mg by mouth every 6 (six) hours as needed for headache (pain)., Disp: , Rfl:    albuterol  (VENTOLIN  HFA) 108 (90 Base) MCG/ACT inhaler, USE 2 PUFFS BY MOUTH EVERY FOUR HOURS, AS NEEDED, FOR COUGHING/WHEEZING, Disp: 8.5 g, Rfl: 0   allopurinol  (ZYLOPRIM ) 100 MG tablet, Take 100 mg by mouth daily., Disp: , Rfl:    amiodarone  (PACERONE ) 200 MG tablet, Take 1 tablet (200 mg total) by mouth 2 (two) times daily for 7 days, THEN 1 tablet (200 mg total) daily., Disp: 60 tablet, Rfl: 0   budesonide -formoterol  (SYMBICORT ) 160-4.5 MCG/ACT inhaler, Inhale 2 puffs into the lungs 2 (two) times daily., Disp: 1 each, Rfl: 2   Cholecalciferol  (VITAMIN D3) 50 MCG (  2000 UT) capsule, Take 1 capsule by mouth every morning., Disp: , Rfl:    ELIQUIS  5 MG TABS tablet, TAKE 1 TABLET (5 MG TOTAL) BY MOUTH 2 (TWO) TIMES DAILY., Disp: 60 tablet, Rfl: 6   folic acid  (FOLVITE ) 1 MG tablet, Take 1 tablet (1 mg total) by mouth daily., Disp: 30 tablet, Rfl: 0   insulin  glargine (LANTUS ) 100 UNIT/ML injection, Inject 58 Units into the skin at bedtime., Disp: , Rfl:    insulin  lispro (HUMALOG ) 100 UNIT/ML KwikPen, Inject 8 Units into the skin 3 (three) times daily with meals. Plus sliding scale, max dose 70 units /day, Disp: , Rfl:    levothyroxine  (SYNTHROID ) 50 MCG tablet, TAKE 1 TABLET (50 MCG TOTAL) BY MOUTH DAILY BEFORE BREAKFAST (AM), Disp: 30 tablet, Rfl: 0   metoprolol  succinate (TOPROL -XL) 25 MG 24 hr tablet, Take 1 tablet (25 mg total) by mouth daily., Disp: 60 tablet, Rfl: 0   Omega-3 Fatty Acids (FISH OIL PO), Take 1 tablet by mouth daily., Disp: , Rfl:    pantoprazole  (PROTONIX ) 40 MG tablet, TAKE 1 TABLET (40 MG TOTAL) BY MOUTH DAILY(AM), Disp: 30 tablet, Rfl: 0   polyethylene glycol (MIRALAX  / GLYCOLAX ) 17  g packet, Take 17 g by mouth daily as needed for moderate constipation., Disp: 14 each, Rfl: 0   potassium chloride  SA (KLOR-CON  M) 20 MEQ tablet, Take 2 tablets (40 mEq total) by mouth every morning AND 1 tablet (20 mEq total) every evening., Disp: 200 tablet, Rfl: 3   rosuvastatin  (CRESTOR ) 40 MG tablet, Take 1 tablet (40 mg total) by mouth daily. (Patient taking differently: Take 40 mg by mouth at bedtime.), Disp: 90 tablet, Rfl: 3   torsemide  (DEMADEX ) 20 MG tablet, Take 3 tablets (60 mg total) by mouth every morning AND 2 tablets (40 mg total) every evening., Disp: , Rfl:    VITAMIN E PO, Take 1 tablet by mouth daily., Disp: , Rfl:    Continuous Glucose Receiver (DEXCOM G7 RECEIVER) DEVI, 1 Device by Does not apply route continuous., Disp: 1 each, Rfl: 0   Continuous Glucose Sensor (DEXCOM G7 SENSOR) MISC, 1 Device by Does not apply route continuous., Disp: 9 each, Rfl: 3   docusate sodium  (COLACE) 100 MG capsule, Take 2 capsules (200 mg total) by mouth at bedtime., Disp: 10 capsule, Rfl: 0   Glucagon  (BAQSIMI  ONE PACK) 3 MG/DOSE POWD, Place 1 Device into the nose as needed (Low blood sugar with impaired consciousness)., Disp: 2 each, Rfl: 3   Menthol -Camphor (ICY HOT PRO NO MESS EX), Apply 1 Application topically 3 (three) times daily as needed (arms, neuropathy lower extrimity, gout in hand.)., Disp: , Rfl:    NYAMYC  powder, Apply 1 Application topically 2 (two) times daily., Disp: , Rfl:    nystatin  cream (MYCOSTATIN ), Apply 1 Application topically 2 (two) times daily as needed for dry skin., Disp: , Rfl:    pantoprazole  (PROTONIX ) 40 MG tablet, Take 1 tablet (40 mg total) by mouth daily., Disp: 30 tablet, Rfl: 0   tirzepatide  (MOUNJARO ) 7.5 MG/0.5ML Pen, Inject 7.5 mg into the skin once a week. (Patient taking differently: Inject 7.5 mg into the skin every Friday.), Disp: 6 mL, Rfl: 1 Allergies  Allergen Reactions   Neurontin  [Gabapentin ] Nausea And Vomiting and Other (See Comments)     POSSIBLE SHAKING   Lyrica  [Pregabalin ] Other (See Comments)    Shaking       Social History   Socioeconomic History   Marital status: Single  Spouse name: Not on file   Number of children: 1   Years of education: 66   Highest education level: 12th grade  Occupational History   Occupation: disabled  Tobacco Use   Smoking status: Every Day    Current packs/day: 1.00    Average packs/day: 1 pack/day for 44.0 years (44.0 ttl pk-yrs)    Types: E-cigarettes, Cigarettes   Smokeless tobacco: Former    Types: Snuff  Vaping Use   Vaping status: Former   Devices: 11/26/2018 stopped months ago  Substance and Sexual Activity   Alcohol use: Not Currently    Comment: occ   Drug use: Yes    Types: Crack cocaine, Marijuana, Oxycodone    Sexual activity: Not Currently  Other Topics Concern   Not on file  Social History Narrative   ** Merged History Encounter **       Lives in [place], in motel with sister.  They are looking to move but don't have a place to go yet.     Social Drivers of Health   Financial Resource Strain: Low Risk  (02/09/2023)   Overall Financial Resource Strain (CARDIA)    Difficulty of Paying Living Expenses: Not hard at all  Food Insecurity: No Food Insecurity (10/05/2024)   Hunger Vital Sign    Worried About Running Out of Food in the Last Year: Never true    Ran Out of Food in the Last Year: Never true  Transportation Needs: Unmet Transportation Needs (10/05/2024)   PRAPARE - Transportation    Lack of Transportation (Medical): Yes    Lack of Transportation (Non-Medical): Yes  Physical Activity: Inactive (02/09/2023)   Exercise Vital Sign    Days of Exercise per Week: 0 days    Minutes of Exercise per Session: 0 min  Stress: No Stress Concern Present (02/09/2023)   [employer] of Occupational Health - Occupational Stress Questionnaire    Feeling of Stress : Not at all  Social Connections: Unknown (08/21/2023)   Received from [facility]   Social  Network    Social Network: Not on file  Intimate Partner Violence: Not At Risk (10/05/2024)   Humiliation, Afraid, Rape, and Kick questionnaire    Fear of Current or Ex-Partner: No    Emotionally Abused: No    Physically Abused: No    Sexually Abused: No    Physical Exam      Future Appointments  Date Time Provider Department Center  10/18/2024 10:15 AM GI-315 US  2 [id number] GI-315 W. WE  10/31/2024 10:55 AM Aniceto Daphne CROME, NP [facility] H&V  11/15/2024  3:00 PM [facility] PA/NP [facility] None       [name], Paramedic [phone] [facility] Paramedic  10/14/24

## 2024-10-15 ENCOUNTER — Other Ambulatory Visit: Payer: Self-pay | Admitting: Cardiovascular Disease

## 2024-10-15 DIAGNOSIS — I5020 Unspecified systolic (congestive) heart failure: Secondary | ICD-10-CM

## 2024-10-18 ENCOUNTER — Other Ambulatory Visit

## 2024-10-21 ENCOUNTER — Other Ambulatory Visit (HOSPITAL_COMMUNITY): Payer: Self-pay

## 2024-10-21 MED ORDER — POTASSIUM CHLORIDE CRYS ER 20 MEQ PO TBCR: EXTENDED_RELEASE_TABLET | ORAL | 3 refills | Status: DC

## 2024-10-21 MED ORDER — AMIODARONE HCL 200 MG PO TABS: ORAL_TABLET | ORAL | 0 refills | Status: DC

## 2024-10-21 MED ORDER — ROSUVASTATIN CALCIUM 40 MG PO TABS: 40.0000 mg | ORAL_TABLET | Freq: Every day | ORAL | 3 refills | Status: DC

## 2024-10-21 NOTE — Progress Notes (Signed)
 Paramedicine Encounter    Patient ID: [name], female    DOB: [birth year], 62 y.o.   MRN: [id number]   I sent message to clinic to see if they were able to send in the new rx of her amio, in the mean time pharmacy was able to get mea  week supply for her.  Looks like the rx was sent to [facility] pharmacy, with out any refills and was not given to her when she was d/c.  So went by pharmacy to p/u the week supply and went back to pts house to place in pill box and to give her one for today.    Patient Care Team: Campbell Reynolds, NP as PCP - General Karla Dorn PARAS, MD as PCP - Cardiology (Cardiology) Karla Ezra GORMAN, MD as PCP - Advanced Heart Failure (Cardiology) Inocencio Soyla Lunger, MD as PCP - Electrophysiology (Cardiology) Karla Dorn PARAS, MD as Consulting Physician (Cardiology) Darlean Ozell NOVAK, MD as Consulting Physician (Pulmonary Disease) System, Provider Not In  Patient Active Problem List   Diagnosis Date Noted   Precordial pain 10/05/2024   Elevated troponin 10/05/2024   Acute HFrEF (heart failure with reduced ejection fraction) ([name]) 10/05/2024   High risk medication use 10/05/2024   Long term current use of antiarrhythmic drug 10/05/2024   Chronic kidney disease, stage IV (severe) ([name]) 10/05/2024   Anemia 10/05/2024   Melanotic stools 10/05/2024   Cardiomyopathy ([name]) 10/05/2024   Acute on chronic congestive heart failure ([name]) 10/04/2024   Acute on chronic anemia 10/04/2024   COPD with acute exacerbation ([name]) 10/03/2024   Acute cystitis 10/03/2024   History of CEA (carotid endarterectomy) 10/03/2024   Gout 10/03/2024   Anemia of chronic renal failure 08/06/2024   Persistent atrial fibrillation ([name]) 03/15/2024   Benign neoplasm of ascending colon 03/14/2024   Benign neoplasm of transverse colon 03/14/2024   Benign neoplasm of descending colon 03/14/2024   Benign neoplasm of sigmoid colon 03/14/2024   Chronic systolic congestive heart failure ([name]) 03/12/2024    Melena 03/12/2024   Heme positive stool 03/11/2024   GI bleed 03/08/2024   History of CAD (coronary artery disease) 03/08/2024   CKD (chronic kidney disease), stage IV ([name]) 03/08/2024   Continuous dependence on cigarette smoking 03/08/2024   GAD (generalized anxiety disorder) 03/08/2024   HFrEF (heart failure with reduced ejection fraction) ([name]) 10/11/2023   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) and diastolic (congestive) heart failure ([name]) 10/10/2023   Pre-ulcerative calluses 04/25/2022   PAF (paroxysmal atrial fibrillation) ([name]) 05/05/2021   COPD (chronic obstructive pulmonary disease) ([name]) 05/05/2021   OSA (obstructive sleep apnea) 05/05/2021   Stage 3b chronic kidney disease ([name]) 05/05/2021   Pressure injury of skin 05/04/2021   Diabetic nephropathy ([name]) 01/02/2021   Low back pain 06/01/2020   Hyperlipidemia 04/03/2020   Peripheral artery disease 12/26/2019   NICM (nonischemic cardiomyopathy) ([name]) 06/20/2019   Non-healing ulcer ([name]) 06/20/2019   Acute CHF (congestive heart failure) ([name]) 11/26/2018   Coagulation disorder 08/09/2017   Depression 07/21/2017   At risk for adverse drug reaction 06/20/2017   Peripheral neuropathy 06/20/2017   S/P transmetatarsal amputation of foot, right ([name]) 06/05/2017   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) 05/19/2017   Obesity, class 2 02/24/2016   Anticoagulation management encounter 02/10/2016   Chronic sinus bradycardia 01/12/2016   Essential hypertension 12/22/2015   Demand ischemia ([name])    Acute on chronic diastolic CHF (congestive heart failure) ([name])  Symptomatic anemia 11/08/2015   Hypokalemia 11/08/2015   Tobacco abuse 10/23/2015   Coronary artery disease    DOE (dyspnea on exertion) 04/29/2015   Paroxysmal atrial fibrillation ([name]) 01/16/2015   Carotid artery stenosis 01/16/2015   Insomnia 02/03/2014   S/P peripheral artery angioplasty -  TurboHawk atherectomy; R SFA 09/11/2013    Class: Acute   Leg pain, bilateral 08/19/2013   Hypothyroidism 07/31/2013   History of cocaine abuse ([name]) 06/13/2013   Long term current use of anticoagulant therapy 05/20/2013   Alcohol abuse    Narcotic abuse ([name])    Marijuana abuse    Alcoholic cirrhosis ([name])    Insulin  dependent type 2 diabetes mellitus ([name])     Current Outpatient Medications:    acetaminophen  (TYLENOL ) 500 MG tablet, Take 1,000 mg by mouth every 6 (six) hours as needed for headache (pain)., Disp: , Rfl:    albuterol  (VENTOLIN  HFA) 108 (90 Base) MCG/ACT inhaler, USE 2 PUFFS BY MOUTH EVERY FOUR HOURS, AS NEEDED, FOR COUGHING/WHEEZING, Disp: 8.5 g, Rfl: 0   allopurinol  (ZYLOPRIM ) 100 MG tablet, Take 100 mg by mouth daily., Disp: , Rfl:    amiodarone  (PACERONE ) 200 MG tablet, Take 1 tablet (200 mg total) by mouth 2 (two) times daily for 7 days, THEN 1 tablet (200 mg total) daily., Disp: 60 tablet, Rfl: 0   budesonide -formoterol  (SYMBICORT ) 160-4.5 MCG/ACT inhaler, Inhale 2 puffs into the lungs 2 (two) times daily., Disp: 1 each, Rfl: 2   Cholecalciferol  (VITAMIN D3) 50 MCG (2000 UT) capsule, Take 1 capsule by mouth every morning., Disp: , Rfl:    Continuous Glucose Receiver (DEXCOM G7 RECEIVER) DEVI, 1 Device by Does not apply route continuous., Disp: 1 each, Rfl: 0   Continuous Glucose Sensor (DEXCOM G7 SENSOR) MISC, 1 Device by Does not apply route continuous., Disp: 9 each, Rfl: 3   docusate sodium  (COLACE) 100 MG capsule, Take 2 capsules (200 mg total) by mouth at bedtime., Disp: 10 capsule, Rfl: 0   ELIQUIS  5 MG TABS tablet, TAKE 1 TABLET (5 MG TOTAL) BY MOUTH 2 (TWO) TIMES DAILY., Disp: 60 tablet, Rfl: 6   folic acid  (FOLVITE ) 1 MG tablet, Take 1 tablet (1 mg total) by mouth daily., Disp: 30 tablet, Rfl: 0   Glucagon  (BAQSIMI  ONE PACK) 3 MG/DOSE POWD, Place 1 Device into the nose as needed (Low blood sugar with impaired consciousness)., Disp: 2 each, Rfl: 3   insulin   glargine (LANTUS ) 100 UNIT/ML injection, Inject 58 Units into the skin at bedtime., Disp: , Rfl:    insulin  lispro (HUMALOG ) 100 UNIT/ML KwikPen, Inject 8 Units into the skin 3 (three) times daily with meals. Plus sliding scale, max dose 70 units /day, Disp: , Rfl:    levothyroxine  (SYNTHROID ) 50 MCG tablet, TAKE 1 TABLET (50 MCG TOTAL) BY MOUTH DAILY BEFORE BREAKFAST (AM), Disp: 30 tablet, Rfl: 0   Menthol -Camphor (ICY HOT PRO NO MESS EX), Apply 1 Application topically 3 (three) times daily as needed (arms, neuropathy lower extrimity, gout in hand.)., Disp: , Rfl:    metoprolol  succinate (TOPROL -XL) 25 MG 24 hr tablet, Take 1 tablet (25 mg total) by mouth daily., Disp: 60 tablet, Rfl: 0   NYAMYC  powder, Apply 1 Application topically 2 (two) times daily., Disp: , Rfl:    nystatin  cream (MYCOSTATIN ), Apply 1 Application topically 2 (two) times daily as needed for dry skin., Disp: , Rfl:    Omega-3 Fatty Acids (FISH OIL PO), Take 1 tablet by mouth daily., Disp: , Rfl:  pantoprazole  (PROTONIX ) 40 MG tablet, TAKE 1 TABLET (40 MG TOTAL) BY MOUTH DAILY(AM), Disp: 30 tablet, Rfl: 0   pantoprazole  (PROTONIX ) 40 MG tablet, Take 1 tablet (40 mg total) by mouth daily., Disp: 30 tablet, Rfl: 0   polyethylene glycol (MIRALAX  / GLYCOLAX ) 17 g packet, Take 17 g by mouth daily as needed for moderate constipation., Disp: 14 each, Rfl: 0   potassium chloride  SA (KLOR-CON  M) 20 MEQ tablet, Take 2 tablets (40 mEq total) by mouth every morning AND 1 tablet (20 mEq total) every evening., Disp: 200 tablet, Rfl: 3   rosuvastatin  (CRESTOR ) 40 MG tablet, Take 1 tablet (40 mg total) by mouth daily. (Patient taking differently: Take 40 mg by mouth at bedtime.), Disp: 90 tablet, Rfl: 3   tirzepatide  (MOUNJARO ) 7.5 MG/0.5ML Pen, Inject 7.5 mg into the skin once a week. (Patient taking differently: Inject 7.5 mg into the skin every Friday.), Disp: 6 mL, Rfl: 1   torsemide  (DEMADEX ) 20 MG tablet, Take 3 tablets (60 mg total) by  mouth every morning AND 2 tablets (40 mg total) every evening., Disp: , Rfl:    VITAMIN E PO, Take 1 tablet by mouth daily., Disp: , Rfl:  Allergies  Allergen Reactions   Neurontin  [Gabapentin ] Nausea And Vomiting and Other (See Comments)    POSSIBLE SHAKING   Lyrica  [Pregabalin ] Other (See Comments)    Shaking       Social History   Socioeconomic History   Marital status: Single    Spouse name: Not on file   Number of children: 1   Years of education: 12   Highest education level: 12th grade  Occupational History   Occupation: disabled  Tobacco Use   Smoking status: Every Day    Current packs/day: 1.00    Average packs/day: 1 pack/day for 44.0 years (44.0 ttl pk-yrs)    Types: E-cigarettes, Cigarettes   Smokeless tobacco: Former    Types: Snuff  Vaping Use   Vaping status: Former   Devices: 11/26/2018 stopped months ago  Substance and Sexual Activity   Alcohol use: Not Currently    Comment: occ   Drug use: Yes    Types: Crack cocaine, Marijuana, Oxycodone    Sexual activity: Not Currently  Other Topics Concern   Not on file  Social History Narrative   ** Merged History Encounter **       Lives in [place], in motel with sister.  They are looking to move but don't have a place to go yet.     Social Drivers of Corporate Investment Banker Strain: Low Risk  (02/09/2023)   Overall Financial Resource Strain (CARDIA)    Difficulty of Paying Living Expenses: Not hard at all  Food Insecurity: No Food Insecurity (10/05/2024)   Hunger Vital Sign    Worried About Running Out of Food in the Last Year: Never true    Ran Out of Food in the Last Year: Never true  Transportation Needs: Unmet Transportation Needs (10/05/2024)   PRAPARE - Transportation    Lack of Transportation (Medical): Yes    Lack of Transportation (Non-Medical): Yes  Physical Activity: Inactive (02/09/2023)   Exercise Vital Sign    Days of Exercise per Week: 0 days    Minutes of Exercise per Session: 0 min   Stress: No Stress Concern Present (02/09/2023)   [employer] of Occupational Health - Occupational Stress Questionnaire    Feeling of Stress : Not at all  Social Connections: Unknown (08/21/2023)  Received from [facility]   Social Network    Social Network: Not on file  Intimate Partner Violence: Not At Risk (10/05/2024)   Humiliation, Afraid, Rape, and Kick questionnaire    Fear of Current or Ex-Partner: No    Emotionally Abused: No    Physically Abused: No    Sexually Abused: No    Physical Exam      Future Appointments  Date Time Provider Department Center  10/29/2024 10:00 AM GI-315 US  2 [id number] GI-315 W. WE  10/31/2024 10:55 AM Aniceto Daphne CROME, NP [facility] H&V  11/15/2024  3:00 PM [facility] PA/NP [facility] None       [name], Paramedic [phone] [facility] Paramedic  10/21/24

## 2024-10-21 NOTE — Progress Notes (Signed)
 Paramedicine Encounter    Patient ID: [name], female    DOB: 10/21/1961, 63 y.o.   MRN: [id number]   Complaints-nausea/weak/knee pain  Edema-ye-non weeping   Compliance with meds-yes  Pill box filled-yes If so, by whom-paramedic   Refills needed-waiting on amio to be sent in to pharmacy    Pt reports she is not feeling well today. She woke up nauseated. No vomiting. No diarrhea. Have not eaten yet.  No meds yet other than her thyroid  pill. No cough.  No c/p, no dizziness, no sob.   She went to PCP last week and her doc did advise to stop the wellbutrin  and colchicine .  She is going tomor to get shot in her knee for her pain  She has completed the 7 days of amio BID. So daily is starting today.  Pharmacy did not deliver her amio-when she was d/c her amio was increased to 200mg  daily but it was not sent to pharmacy nor given to her when she was d/c.   Meds verified and pill box refilled.   She goes to cardio on 12/18-her b/p is starting to creep back up. She is monitoring it at home as well.  This morning it was 150/80. I told her to let me know if it continues to be high or gets higher than this and we will have to send message to doc.  Will continue to monitor.     CBG-94  BP (!) 140/64   Pulse 65   Resp 18   Wt 211 lb (95.7 kg)   LMP  (LMP Unknown)   SpO2 98%   BMI 37.38 kg/m  Weight yesterday-? Last visit weight-222  Patient Care Team: Campbell Reynolds, NP as PCP - General Court Dorn PARAS, MD as PCP - Cardiology (Cardiology) Rolan Ezra GORMAN, MD as PCP - Advanced Heart Failure (Cardiology) Inocencio Soyla Lunger, MD as PCP - Electrophysiology (Cardiology) Court Dorn PARAS, MD as Consulting Physician (Cardiology) Darlean Ozell NOVAK, MD as Consulting Physician (Pulmonary Disease) System, Provider Not In  Patient Active Problem List   Diagnosis Date Noted   Precordial pain 10/05/2024   Elevated troponin 10/05/2024   Acute HFrEF (heart failure with reduced  ejection fraction) ([name]) 10/05/2024   High risk medication use 10/05/2024   Long term current use of antiarrhythmic drug 10/05/2024   Chronic kidney disease, stage IV (severe) ([name]) 10/05/2024   Anemia 10/05/2024   Melanotic stools 10/05/2024   Cardiomyopathy ([name]) 10/05/2024   Acute on chronic congestive heart failure ([name]) 10/04/2024   Acute on chronic anemia 10/04/2024   COPD with acute exacerbation ([name]) 10/03/2024   Acute cystitis 10/03/2024   History of CEA (carotid endarterectomy) 10/03/2024   Gout 10/03/2024   Anemia of chronic renal failure 08/06/2024   Persistent atrial fibrillation ([name]) 03/15/2024   Benign neoplasm of ascending colon 03/14/2024   Benign neoplasm of transverse colon 03/14/2024   Benign neoplasm of descending colon 03/14/2024   Benign neoplasm of sigmoid colon 03/14/2024   Chronic systolic congestive heart failure ([name]) 03/12/2024   Melena 03/12/2024   Heme positive stool 03/11/2024   GI bleed 03/08/2024   History of CAD (coronary artery disease) 03/08/2024   CKD (chronic kidney disease), stage IV ([name]) 03/08/2024   Continuous dependence on cigarette smoking 03/08/2024   GAD (generalized anxiety disorder) 03/08/2024   HFrEF (heart failure with reduced ejection fraction) ([name]) 10/11/2023   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) and diastolic (congestive) heart failure ([name]) 10/10/2023   Pre-ulcerative  calluses 04/25/2022   PAF (paroxysmal atrial fibrillation) ([name]) 05/05/2021   COPD (chronic obstructive pulmonary disease) ([name]) 05/05/2021   OSA (obstructive sleep apnea) 05/05/2021   Stage 3b chronic kidney disease ([name]) 05/05/2021   Pressure injury of skin 05/04/2021   Diabetic nephropathy ([name]) 01/02/2021   Low back pain 06/01/2020   Hyperlipidemia 04/03/2020   Peripheral artery disease 12/26/2019   NICM (nonischemic cardiomyopathy) ([name]) 06/20/2019   Non-healing ulcer ([name]) 06/20/2019   Acute CHF (congestive heart failure) ([name]) 11/26/2018    Coagulation disorder 08/09/2017   Depression 07/21/2017   At risk for adverse drug reaction 06/20/2017   Peripheral neuropathy 06/20/2017   S/P transmetatarsal amputation of foot, right ([name]) 06/05/2017   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) 05/19/2017   Obesity, class 2 02/24/2016   Anticoagulation management encounter 02/10/2016   Chronic sinus bradycardia 01/12/2016   Essential hypertension 12/22/2015   Demand ischemia ([name])    Acute on chronic diastolic CHF (congestive heart failure) ([name])    Symptomatic anemia 11/08/2015   Hypokalemia 11/08/2015   Tobacco abuse 10/23/2015   Coronary artery disease    DOE (dyspnea on exertion) 04/29/2015   Paroxysmal atrial fibrillation ([name]) 01/16/2015   Carotid artery stenosis 01/16/2015   Insomnia 02/03/2014   S/P peripheral artery angioplasty - TurboHawk atherectomy; R SFA 09/11/2013    Class: Acute   Leg pain, bilateral 08/19/2013   Hypothyroidism 07/31/2013   History of cocaine abuse ([name]) 06/13/2013   Long term current use of anticoagulant therapy 05/20/2013   Alcohol abuse    Narcotic abuse ([name])    Marijuana abuse    Alcoholic cirrhosis ([name])    Insulin  dependent type 2 diabetes mellitus ([name])     Current Outpatient Medications:    acetaminophen  (TYLENOL ) 500 MG tablet, Take 1,000 mg by mouth every 6 (six) hours as needed for headache (pain)., Disp: , Rfl:    albuterol  (VENTOLIN  HFA) 108 (90 Base) MCG/ACT inhaler, USE 2 PUFFS BY MOUTH EVERY FOUR HOURS, AS NEEDED, FOR COUGHING/WHEEZING, Disp: 8.5 g, Rfl: 0   allopurinol  (ZYLOPRIM ) 100 MG tablet, Take 100 mg by mouth daily., Disp: , Rfl:    amiodarone  (PACERONE ) 200 MG tablet, Take 1 tablet (200 mg total) by mouth 2 (two) times daily for 7 days, THEN 1 tablet (200 mg total) daily., Disp: 60 tablet, Rfl: 0   budesonide -formoterol  (SYMBICORT ) 160-4.5 MCG/ACT inhaler, Inhale 2 puffs into the lungs 2 (two) times daily.,  Disp: 1 each, Rfl: 2   Cholecalciferol  (VITAMIN D3) 50 MCG (2000 UT) capsule, Take 1 capsule by mouth every morning., Disp: , Rfl:    ELIQUIS  5 MG TABS tablet, TAKE 1 TABLET (5 MG TOTAL) BY MOUTH 2 (TWO) TIMES DAILY., Disp: 60 tablet, Rfl: 6   insulin  glargine (LANTUS ) 100 UNIT/ML injection, Inject 58 Units into the skin at bedtime., Disp: , Rfl:    insulin  lispro (HUMALOG ) 100 UNIT/ML KwikPen, Inject 8 Units into the skin 3 (three) times daily with meals. Plus sliding scale, max dose 70 units /day, Disp: , Rfl:    levothyroxine  (SYNTHROID ) 50 MCG tablet, TAKE 1 TABLET (50 MCG TOTAL) BY MOUTH DAILY BEFORE BREAKFAST (AM), Disp: 30 tablet, Rfl: 0   metoprolol  succinate (TOPROL -XL) 25 MG 24 hr tablet, Take 1 tablet (25 mg total) by mouth daily., Disp: 60 tablet, Rfl: 0   Omega-3 Fatty Acids (FISH OIL PO), Take 1 tablet by mouth daily., Disp: , Rfl:    pantoprazole  (PROTONIX ) 40 MG  tablet, TAKE 1 TABLET (40 MG TOTAL) BY MOUTH DAILY(AM), Disp: 30 tablet, Rfl: 0   potassium chloride  SA (KLOR-CON  M) 20 MEQ tablet, Take 2 tablets (40 mEq total) by mouth every morning AND 1 tablet (20 mEq total) every evening., Disp: 200 tablet, Rfl: 3   torsemide  (DEMADEX ) 20 MG tablet, Take 3 tablets (60 mg total) by mouth every morning AND 2 tablets (40 mg total) every evening., Disp: , Rfl:    VITAMIN E PO, Take 1 tablet by mouth daily., Disp: , Rfl:    Continuous Glucose Receiver (DEXCOM G7 RECEIVER) DEVI, 1 Device by Does not apply route continuous., Disp: 1 each, Rfl: 0   Continuous Glucose Sensor (DEXCOM G7 SENSOR) MISC, 1 Device by Does not apply route continuous., Disp: 9 each, Rfl: 3   docusate sodium  (COLACE) 100 MG capsule, Take 2 capsules (200 mg total) by mouth at bedtime., Disp: 10 capsule, Rfl: 0   folic acid  (FOLVITE ) 1 MG tablet, Take 1 tablet (1 mg total) by mouth daily., Disp: 30 tablet, Rfl: 0   Glucagon  (BAQSIMI  ONE PACK) 3 MG/DOSE POWD, Place 1 Device into the nose as needed (Low blood sugar with  impaired consciousness)., Disp: 2 each, Rfl: 3   Menthol -Camphor (ICY HOT PRO NO MESS EX), Apply 1 Application topically 3 (three) times daily as needed (arms, neuropathy lower extrimity, gout in hand.)., Disp: , Rfl:    NYAMYC  powder, Apply 1 Application topically 2 (two) times daily., Disp: , Rfl:    nystatin  cream (MYCOSTATIN ), Apply 1 Application topically 2 (two) times daily as needed for dry skin., Disp: , Rfl:    pantoprazole  (PROTONIX ) 40 MG tablet, Take 1 tablet (40 mg total) by mouth daily., Disp: 30 tablet, Rfl: 0   polyethylene glycol (MIRALAX  / GLYCOLAX ) 17 g packet, Take 17 g by mouth daily as needed for moderate constipation., Disp: 14 each, Rfl: 0   rosuvastatin  (CRESTOR ) 40 MG tablet, Take 1 tablet (40 mg total) by mouth daily. (Patient taking differently: Take 40 mg by mouth at bedtime.), Disp: 90 tablet, Rfl: 3   tirzepatide  (MOUNJARO ) 7.5 MG/0.5ML Pen, Inject 7.5 mg into the skin once a week. (Patient taking differently: Inject 7.5 mg into the skin every Friday.), Disp: 6 mL, Rfl: 1 Allergies  Allergen Reactions   Neurontin  [Gabapentin ] Nausea And Vomiting and Other (See Comments)    POSSIBLE SHAKING   Lyrica  [Pregabalin ] Other (See Comments)    Shaking       Social History   Socioeconomic History   Marital status: Single    Spouse name: Not on file   Number of children: 1   Years of education: 12   Highest education level: 12th grade  Occupational History   Occupation: disabled  Tobacco Use   Smoking status: Every Day    Current packs/day: 1.00    Average packs/day: 1 pack/day for 44.0 years (44.0 ttl pk-yrs)    Types: E-cigarettes, Cigarettes   Smokeless tobacco: Former    Types: Snuff  Vaping Use   Vaping status: Former   Devices: 11/26/2018 stopped months ago  Substance and Sexual Activity   Alcohol use: Not Currently    Comment: occ   Drug use: Yes    Types: Crack cocaine, Marijuana, Oxycodone    Sexual activity: Not Currently  Other Topics  Concern   Not on file  Social History Narrative   ** Merged History Encounter **       Lives in [place], in motel with sister.  They  are looking to move but don't have a place to go yet.     Social Drivers of Health   Financial Resource Strain: Low Risk  (02/09/2023)   Overall Financial Resource Strain (CARDIA)    Difficulty of Paying Living Expenses: Not hard at all  Food Insecurity: No Food Insecurity (10/05/2024)   Hunger Vital Sign    Worried About Running Out of Food in the Last Year: Never true    Ran Out of Food in the Last Year: Never true  Transportation Needs: Unmet Transportation Needs (10/05/2024)   PRAPARE - Transportation    Lack of Transportation (Medical): Yes    Lack of Transportation (Non-Medical): Yes  Physical Activity: Inactive (02/09/2023)   Exercise Vital Sign    Days of Exercise per Week: 0 days    Minutes of Exercise per Session: 0 min  Stress: No Stress Concern Present (02/09/2023)   [employer] of Occupational [employer] - Occupational Stress Questionnaire    Feeling of Stress : Not at all  Social Connections: Unknown (08/21/2023)   [facility]   Social Network    Social Network: Not on file  Intimate Partner Violence: Not At Risk (10/05/2024)   Humiliation, Afraid, Rape, and Kick questionnaire    Fear of Current or Ex-Partner: No    Emotionally Abused: No    Physically Abused: No    Sexually Abused: No    Physical Exam      Future Appointments  Date Time Provider Department Center  10/29/2024 10:00 AM GI-315 US  2 [id number] GI-315 W. WE  10/31/2024 10:55 AM Aniceto Daphne CROME, NP [facility] H&V  11/15/2024  3:00 PM [facility] PA/NP [facility] None       [name], Paramedic [phone] [facility] Paramedic  10/21/24

## 2024-10-28 ENCOUNTER — Other Ambulatory Visit (HOSPITAL_COMMUNITY): Payer: Self-pay

## 2024-10-28 NOTE — Progress Notes (Signed)
 Paramedicine Encounter    Patient ID: [name], female    DOB: 12-Oct-1961, 63 y.o.   MRN: [id number]   Complaints-chronic pain   Edema-very slight   Compliance with meds-yes  Pill box filled-yes If so, by whom-pillbox   Refills needed- Eliquis  -ordered that for delivery      Pt reports she is doing ok. She denies any issues or concerns right now. She has her chronic pain.  She denies increased sob, no c/p, no dizziness, no bleeding issues. Appetite ok. Her weight is up 4lbs from last week.  It was her birthday this wknd, so she did partake in more food this wknd.   She has not used her cardiomems recently.  I had to go p/u amio from pharmacy as they didn't deliver it last week.  She is wearing some compression type stockings and that has seemed to help with her swelling.   Meds verified and pill box refilled.   CBG-168  Patient Care Team: Campbell Reynolds, NP as PCP - General Court Dorn PARAS, MD as PCP - Cardiology (Cardiology) Rolan Ezra GORMAN, MD as PCP - Advanced Heart Failure (Cardiology) Inocencio Soyla Lunger, MD as PCP - Electrophysiology (Cardiology) Court Dorn PARAS, MD as Consulting Physician (Cardiology) Darlean Ozell NOVAK, MD as Consulting Physician (Pulmonary Disease) System, Provider Not In  Patient Active Problem List   Diagnosis Date Noted   Precordial pain 10/05/2024   Elevated troponin 10/05/2024   Acute HFrEF (heart failure with reduced ejection fraction) ([name]) 10/05/2024   High risk medication use 10/05/2024   Long term current use of antiarrhythmic drug 10/05/2024   Chronic kidney disease, stage IV (severe) ([name]) 10/05/2024   Anemia 10/05/2024   Melanotic stools 10/05/2024   Cardiomyopathy ([name]) 10/05/2024   Acute on chronic congestive heart failure ([name]) 10/04/2024   Acute on chronic anemia 10/04/2024   COPD with acute exacerbation ([name]) 10/03/2024   Acute cystitis 10/03/2024   History of CEA (carotid endarterectomy) 10/03/2024    Gout 10/03/2024   Anemia of chronic renal failure 08/06/2024   Persistent atrial fibrillation ([name]) 03/15/2024   Benign neoplasm of ascending colon 03/14/2024   Benign neoplasm of transverse colon 03/14/2024   Benign neoplasm of descending colon 03/14/2024   Benign neoplasm of sigmoid colon 03/14/2024   Chronic systolic congestive heart failure ([name]) 03/12/2024   Melena 03/12/2024   Heme positive stool 03/11/2024   GI bleed 03/08/2024   History of CAD (coronary artery disease) 03/08/2024   CKD (chronic kidney disease), stage IV ([name]) 03/08/2024   Continuous dependence on cigarette smoking 03/08/2024   GAD (generalized anxiety disorder) 03/08/2024   HFrEF (heart failure with reduced ejection fraction) ([name]) 10/11/2023   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) and diastolic (congestive) heart failure ([name]) 10/10/2023   Pre-ulcerative calluses 04/25/2022   PAF (paroxysmal atrial fibrillation) ([name]) 05/05/2021   COPD (chronic obstructive pulmonary disease) ([name]) 05/05/2021   OSA (obstructive sleep apnea) 05/05/2021   Stage 3b chronic kidney disease ([name]) 05/05/2021   Pressure injury of skin 05/04/2021   Diabetic nephropathy ([name]) 01/02/2021   Low back pain 06/01/2020   Hyperlipidemia 04/03/2020   Peripheral artery disease 12/26/2019   NICM (nonischemic cardiomyopathy) ([name]) 06/20/2019   Non-healing ulcer ([name]) 06/20/2019   Acute CHF (congestive heart failure) ([name]) 11/26/2018   Coagulation disorder 08/09/2017   Depression 07/21/2017   At risk for adverse drug reaction 06/20/2017   Peripheral neuropathy 06/20/2017   S/P transmetatarsal amputation of foot, right ([name]) 06/05/2017   Idiopathic  chronic 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 ([name]) 05/19/2017   Obesity, class 2 02/24/2016   Anticoagulation management encounter 02/10/2016   Chronic sinus bradycardia 01/12/2016   Essential hypertension  12/22/2015   Demand ischemia ([name])    Acute on chronic diastolic CHF (congestive heart failure) ([name])    Symptomatic anemia 11/08/2015   Hypokalemia 11/08/2015   Tobacco abuse 10/23/2015   Coronary artery disease    DOE (dyspnea on exertion) 04/29/2015   Paroxysmal atrial fibrillation ([name]) 01/16/2015   Carotid artery stenosis 01/16/2015   Insomnia 02/03/2014   S/P peripheral artery angioplasty - TurboHawk atherectomy; R SFA 09/11/2013    Class: Acute   Leg pain, bilateral 08/19/2013   Hypothyroidism 07/31/2013   History of cocaine abuse ([name]) 06/13/2013   Long term current use of anticoagulant therapy 05/20/2013   Alcohol abuse    Narcotic abuse ([name])    Marijuana abuse    Alcoholic cirrhosis ([name])    Insulin  dependent type 2 diabetes mellitus ([name])    Current Medications[1] Allergies[2]    Social History   Socioeconomic History   Marital status: Single    Spouse name: Not on file   Number of children: 1   Years of education: 12   Highest education level: 12th grade  Occupational History   Occupation: disabled  Tobacco Use   Smoking status: Every Day    Current packs/day: 1.00    Average packs/day: 1 pack/day for 44.0 years (44.0 ttl pk-yrs)    Types: E-cigarettes, Cigarettes   Smokeless tobacco: Former    Types: Snuff  Vaping Use   Vaping status: Former   Devices: 11/26/2018 stopped months ago  Substance and Sexual Activity   Alcohol use: Not Currently    Comment: occ   Drug use: Yes    Types: Crack cocaine, Marijuana, Oxycodone    Sexual activity: Not Currently  Other Topics Concern   Not on file  Social History Narrative   ** Merged History Encounter **       Lives in [place], in motel with sister.  They are looking to move but don't have a place to go yet.     Social Drivers of Health   Tobacco Use: High Risk (10/03/2024)   Patient History    Smoking Tobacco Use: Every Day    Smokeless Tobacco Use: Former     Passive Exposure: Not on Actuary Strain: Low Risk (02/09/2023)   Overall Financial Resource Strain (CARDIA)    Difficulty of Paying Living Expenses: Not hard at all  Food Insecurity: No Food Insecurity (10/05/2024)   Epic    Worried About Programme Researcher, Broadcasting/film/video in the Last Year: Never true    Ran Out of Food in the Last Year: Never true  Transportation Needs: Unmet Transportation Needs (10/05/2024)   Epic    Lack of Transportation (Medical): Yes    Lack of Transportation (Non-Medical): Yes  Physical Activity: Inactive (02/09/2023)   Exercise Vital Sign    Days of Exercise per Week: 0 days    Minutes of Exercise per Session: 0 min  Stress: No Stress Concern Present (02/09/2023)   [employer] - Occupational Stress Questionnaire    Feeling of Stress : Not at all  Social Connections: Unknown (08/21/2023)   Received from [facility]   Social Network    Social Network: Not on file  Intimate Partner Violence: Not At Risk (10/05/2024)   Epic    Fear of  Current or Ex-Partner: No    Emotionally Abused: No    Physically Abused: No    Sexually Abused: No  Depression (PHQ2-9): Low Risk (02/09/2023)   Depression (PHQ2-9)    PHQ-2 Score: 0  Alcohol Screen: Not on file  Housing: Low Risk (10/05/2024)   Epic    Unable to Pay for Housing in the Last Year: No    Number of Times Moved in the Last Year: 0    Homeless in the Last Year: No  Utilities: Not At Risk (10/05/2024)   Epic    Threatened with loss of utilities: No  Health Literacy: Not on file    Physical Exam      Future Appointments  Date Time Provider Department Center  10/29/2024 10:00 AM GI-315 US  2 GI-315US1 GI-315 W. WE  10/31/2024 10:55 AM Aniceto Daphne CROME, NP [facility] H&V  11/15/2024  3:00 PM [facility] PA/NP [facility] None       [name], Paramedic [phone] [facility] Paramedic  10/28/2024      [1]  Current Outpatient Medications:     acetaminophen  (TYLENOL ) 500 MG tablet, Take 1,000 mg by mouth every 6 (six) hours as needed for headache (pain)., Disp: , Rfl:    albuterol  (VENTOLIN  HFA) 108 (90 Base) MCG/ACT inhaler, USE 2 PUFFS BY MOUTH EVERY FOUR HOURS, AS NEEDED, FOR COUGHING/WHEEZING, Disp: 8.5 g, Rfl: 0   allopurinol  (ZYLOPRIM ) 100 MG tablet, Take 100 mg by mouth daily., Disp: , Rfl:    amiodarone  (PACERONE ) 200 MG tablet, Take 1 tablet (200 mg total) by mouth 2 (two) times daily for 7 days, THEN 1 tablet (200 mg total) daily., Disp: 60 tablet, Rfl: 0   budesonide -formoterol  (SYMBICORT ) 160-4.5 MCG/ACT inhaler, Inhale 2 puffs into the lungs 2 (two) times daily., Disp: 1 each, Rfl: 2   Cholecalciferol  (VITAMIN D3) 50 MCG (2000 UT) capsule, Take 1 capsule by mouth every morning., Disp: , Rfl:    ELIQUIS  5 MG TABS tablet, TAKE 1 TABLET (5 MG TOTAL) BY MOUTH 2 (TWO) TIMES DAILY., Disp: 60 tablet, Rfl: 6   insulin  glargine (LANTUS ) 100 UNIT/ML injection, Inject 58 Units into the skin at bedtime., Disp: , Rfl:    metoprolol  succinate (TOPROL -XL) 25 MG 24 hr tablet, Take 1 tablet (25 mg total) by mouth daily., Disp: 60 tablet, Rfl: 0   NYAMYC  powder, Apply 1 Application topically 2 (two) times daily., Disp: , Rfl:    nystatin  cream (MYCOSTATIN ), Apply 1 Application topically 2 (two) times daily as needed for dry skin., Disp: , Rfl:    Omega-3 Fatty Acids (FISH OIL PO), Take 1 tablet by mouth daily., Disp: , Rfl:    pantoprazole  (PROTONIX ) 40 MG tablet, Take 1 tablet (40 mg total) by mouth daily., Disp: 30 tablet, Rfl: 0   potassium chloride  SA (KLOR-CON  M) 20 MEQ tablet, Take 2 tablets (40 mEq total) by mouth every morning AND 1 tablet (20 mEq total) every evening., Disp: 200 tablet, Rfl: 3   rosuvastatin  (CRESTOR ) 40 MG tablet, Take 1 tablet (40 mg total) by mouth daily., Disp: 90 tablet, Rfl: 3   torsemide  (DEMADEX ) 20 MG tablet, Take 3 tablets (60 mg total) by mouth every morning AND 2 tablets (40 mg total) every  evening., Disp: , Rfl:    VITAMIN E PO, Take 1 tablet by mouth daily., Disp: , Rfl:    Continuous Glucose Receiver (DEXCOM G7 RECEIVER) DEVI, 1 Device by Does not apply route continuous., Disp: 1 each, Rfl: 0   Continuous  Glucose Sensor (DEXCOM G7 SENSOR) MISC, 1 Device by Does not apply route continuous., Disp: 9 each, Rfl: 3   docusate sodium  (COLACE) 100 MG capsule, Take 2 capsules (200 mg total) by mouth at bedtime., Disp: 10 capsule, Rfl: 0   folic acid  (FOLVITE ) 1 MG tablet, Take 1 tablet (1 mg total) by mouth daily., Disp: 30 tablet, Rfl: 0   Glucagon  (BAQSIMI  ONE PACK) 3 MG/DOSE POWD, Place 1 Device into the nose as needed (Low blood sugar with impaired consciousness)., Disp: 2 each, Rfl: 3   insulin  lispro (HUMALOG ) 100 UNIT/ML KwikPen, Inject 8 Units into the skin 3 (three) times daily with meals. Plus sliding scale, max dose 70 units /day, Disp: , Rfl:    levothyroxine  (SYNTHROID ) 50 MCG tablet, TAKE 1 TABLET (50 MCG TOTAL) BY MOUTH DAILY BEFORE BREAKFAST (AM), Disp: 30 tablet, Rfl: 0   Menthol -Camphor (ICY HOT PRO NO MESS EX), Apply 1 Application topically 3 (three) times daily as needed (arms, neuropathy lower extrimity, gout in hand.)., Disp: , Rfl:    pantoprazole  (PROTONIX ) 40 MG tablet, TAKE 1 TABLET (40 MG TOTAL) BY MOUTH DAILY(AM), Disp: 30 tablet, Rfl: 0   polyethylene glycol (MIRALAX  / GLYCOLAX ) 17 g packet, Take 17 g by mouth daily as needed for moderate constipation., Disp: 14 each, Rfl: 0   tirzepatide  (MOUNJARO ) 7.5 MG/0.5ML Pen, Inject 7.5 mg into the skin once a week. (Patient taking differently: Inject 7.5 mg into the skin every Friday.), Disp: 6 mL, Rfl: 1 [2] Allergies Allergen Reactions   Neurontin  [Gabapentin ] Nausea And Vomiting and Other (See Comments)    POSSIBLE SHAKING   Lyrica  [Pregabalin ] Other (See Comments)    Shaking

## 2024-10-29 ENCOUNTER — Inpatient Hospital Stay: Admission: RE | Admit: 2024-10-29 | Discharge: 2024-10-29 | Attending: Student

## 2024-10-29 DIAGNOSIS — K7469 Other cirrhosis of liver: Secondary | ICD-10-CM

## 2024-10-30 NOTE — Progress Notes (Deleted)
°  Electrophysiology Office Note:   Date:  10/30/2024  ID:  TEEGAN GUINTHER, DOB 10-15-1961, MRN [id number]  Primary Cardiologist: Dorn Lesches, MD Primary Heart Failure: Ezra Shuck, MD Electrophysiologist: Will Gladis Norton, MD  {Click to update primary MD,subspecialty MD or APP then REFRESH:1}    History of Present Illness:   [name] is a 63 y.o. female with h/o AF, HFrEF, CAD, HLD, carotid stenosis s/p CEA, PAD, tobacco abuse, COPD, gout, & GIB seen today for routine electrophysiology followup.   Admitted in April 2025 for GIB.   Admitted 11/20-11/29/25 for recurrent GIB requiring transfusion of PRBC. Felt to be a poor candidate for deep enteroscopy.  Eliquis  was resumed and Hgb remained stable.  Outpatient Palliative Care referral was placed.    Since last being seen in our clinic the patient reports doing ***.   She *** denies chest pain, palpitations, dyspnea, PND, orthopnea, nausea, vomiting, dizziness, syncope, edema, weight gain, or early satiety.   Review of systems complete and found to be negative unless listed in HPI.   EP Information / Studies Reviewed:    EKG is ordered today. Personal review as below.       Arrhythmia / AAD / Pertinent EP Studies AF  Hx DCCV  Risk Assessment/Calculations:    CHA2DS2-VASc Score = 5  {Confirm score is correct.  If not, click here to update score.  REFRESH note.  :1} This indicates a 7.2% annual risk of stroke. The patient's score is based upon: CHF History: 1 HTN History: 1 Diabetes History: 1 Stroke History: 0 Vascular Disease History: 1 Age Score: 0 Gender Score: 1   {This patient has a significant risk of stroke if diagnosed with atrial fibrillation.  Please consider VKA or DOAC agent for anticoagulation if the bleeding risk is acceptable.   You can also use the SmartPhrase .[email or web address] for documentation.   :[id number]} No BP recorded.  {Refresh Note OR Click here to enter BP  :1}***        Physical Exam:    VS:  LMP  (LMP Unknown)    Wt Readings from Last 3 Encounters:  10/28/24 215 lb (97.5 kg)  10/21/24 211 lb (95.7 kg)  10/14/24 222 lb (100.7 kg)     GEN: Well nourished, well developed in no acute distress NECK: No JVD; No carotid bruits CARDIAC: {EPRHYTHM:28826}, no murmurs, rubs, gallops RESPIRATORY:  Clear to auscultation without rales, wheezing or rhonchi  ABDOMEN: Soft, non-tender, non-distended EXTREMITIES:  No edema; No deformity   ASSESSMENT AND PLAN:    Paroxysmal Atrial Fibrillation  High Risk Medication Monitoring: Amiodarone   CHA2DS2-VASc 5, hx DCCV -OAC for stroke prophylaxis  -amiodarone  100 mg daily  -update amio labs > TSH, free T4, LFT's ***  -Toprol  25 mg daily   Secondary Hypercoagulable State  -continue Eliquis  5mg  BID, dose reviewed and appropriate by age / wt -any bleeding ? ***   HFmrEF  Severe MV Regurgitation  -follows with advanced HF Team  -euvolemic on exam ***  -cardiorenal syndrome limits GDMT   CAD  PVD HLD -per Cardiology   Hypertension  -well controlled on current regimen ***   Follow up with Dr. Norton {EPFOLLOW LE:71826}  Signed, Daphne Barrack, NP-C, AGACNP-BC [facility] - Electrophysiology  10/30/2024, 8:36 PM

## 2024-10-31 ENCOUNTER — Ambulatory Visit: Admitting: Pulmonary Disease

## 2024-11-04 ENCOUNTER — Other Ambulatory Visit (HOSPITAL_COMMUNITY): Payer: Self-pay

## 2024-11-04 NOTE — Progress Notes (Signed)
 Paramedicine Encounter    Patient ID: [name], female    DOB: 1960-12-21, 63 y.o.   MRN: [id number]   Complaints-foot pain   Edema-improved   Compliance with meds-yes  Pill box filled-yes If so, by whom-paramedic   Refills needed-none     Pt reports she is feeling good other than her foot hurting really bad. (Chronic issues)  She denies increased sob, no dizziness, no c/p.  Edema much improved. Weight stable. No other complaints or issues at this time.   Meds verified and pill box refilled.  Will f/u next week.    BP 118/64   Pulse 70   Resp 18   Wt 215 lb (97.5 kg)   LMP  (LMP Unknown)   SpO2 96%   BMI 38.09 kg/m  Weight yesterday-? Last visit weight-215   Patient Care Team: Campbell Reynolds, NP as PCP - [address] Court Dorn PARAS, MD as PCP - Cardiology (Cardiology) Rolan Ezra GORMAN, MD as PCP - Advanced Heart Failure (Cardiology) Inocencio Soyla Lunger, MD as PCP - Electrophysiology (Cardiology) Court Dorn PARAS, MD as Consulting Physician (Cardiology) Darlean Ozell NOVAK, MD as Consulting Physician (Pulmonary Disease) System, Provider Not In  Patient Active Problem List   Diagnosis Date Noted   Precordial pain 10/05/2024   Elevated troponin 10/05/2024   Acute HFrEF (heart failure with reduced ejection fraction) ([name]) 10/05/2024   High risk medication use 10/05/2024   Long term current use of antiarrhythmic drug 10/05/2024   Chronic kidney disease, stage IV (severe) ([name]) 10/05/2024   Anemia 10/05/2024   Melanotic stools 10/05/2024   Cardiomyopathy ([name]) 10/05/2024   Acute on chronic congestive heart failure ([name]) 10/04/2024   Acute on chronic anemia 10/04/2024   COPD with acute exacerbation ([name]) 10/03/2024   Acute cystitis 10/03/2024   History of CEA (carotid endarterectomy) 10/03/2024   Gout 10/03/2024   Anemia of chronic renal failure 08/06/2024   Persistent atrial fibrillation ([name]) 03/15/2024   Benign neoplasm of ascending colon 03/14/2024    Benign neoplasm of transverse colon 03/14/2024   Benign neoplasm of descending colon 03/14/2024   Benign neoplasm of sigmoid colon 03/14/2024   Chronic systolic congestive heart failure ([name]) 03/12/2024   Melena 03/12/2024   Heme positive stool 03/11/2024   GI bleed 03/08/2024   History of CAD (coronary artery disease) 03/08/2024   CKD (chronic kidney disease), stage IV ([name]) 03/08/2024   Continuous dependence on cigarette smoking 03/08/2024   GAD (generalized anxiety disorder) 03/08/2024   HFrEF (heart failure with reduced ejection fraction) ([name]) 10/11/2023   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) and diastolic (congestive) heart failure ([name]) 10/10/2023   Pre-ulcerative calluses 04/25/2022   PAF (paroxysmal atrial fibrillation) ([name]) 05/05/2021   COPD (chronic obstructive pulmonary disease) ([name]) 05/05/2021   OSA (obstructive sleep apnea) 05/05/2021   Stage 3b chronic kidney disease ([name]) 05/05/2021   Pressure injury of skin 05/04/2021   Diabetic nephropathy ([name]) 01/02/2021   Low back pain 06/01/2020   Hyperlipidemia 04/03/2020   Peripheral artery disease 12/26/2019   NICM (nonischemic cardiomyopathy) ([name]) 06/20/2019   Non-healing ulcer ([name]) 06/20/2019   Acute CHF (congestive heart failure) ([name]) 11/26/2018   Coagulation disorder 08/09/2017   Depression 07/21/2017   At risk for adverse drug reaction 06/20/2017   Peripheral neuropathy 06/20/2017   S/P transmetatarsal amputation of foot, right ([name]) 06/05/2017   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) 05/19/2017   Obesity, class 2 02/24/2016   Anticoagulation management encounter  02/10/2016   Chronic sinus bradycardia 01/12/2016   Essential hypertension 12/22/2015   Demand ischemia ([name])    Acute on chronic diastolic CHF (congestive heart failure) ([name])    Symptomatic anemia 11/08/2015   Hypokalemia 11/08/2015   Tobacco abuse 10/23/2015   Coronary artery  disease    DOE (dyspnea on exertion) 04/29/2015   Paroxysmal atrial fibrillation ([name]) 01/16/2015   Carotid artery stenosis 01/16/2015   Insomnia 02/03/2014   S/P peripheral artery angioplasty - TurboHawk atherectomy; R SFA 09/11/2013    Class: Acute   Leg pain, bilateral 08/19/2013   Hypothyroidism 07/31/2013   History of cocaine abuse ([name]) 06/13/2013   Long term current use of anticoagulant therapy 05/20/2013   Alcohol abuse    Narcotic abuse ([name])    Marijuana abuse    Alcoholic cirrhosis ([name])    Insulin  dependent type 2 diabetes mellitus ([name])    Current Medications[1] Allergies[2]    Social History   Socioeconomic History   Marital status: Single    Spouse name: Not on file   Number of children: 1   Years of education: 12   Highest education level: 12th grade  Occupational History   Occupation: disabled  Tobacco Use   Smoking status: Every Day    Current packs/day: 1.00    Average packs/day: 1 pack/day for 44.0 years (44.0 ttl pk-yrs)    Types: E-cigarettes, Cigarettes   Smokeless tobacco: Former    Types: Snuff  Vaping Use   Vaping status: Former   Devices: 11/26/2018 stopped months ago  Substance and Sexual Activity   Alcohol use: Not Currently    Comment: occ   Drug use: Yes    Types: Crack cocaine, Marijuana, Oxycodone    Sexual activity: Not Currently  Other Topics Concern   Not on file  Social History Narrative   ** Merged History Encounter **       Lives in [place], in motel with sister.  They are looking to move but don't have a place to go yet.     Social Drivers of Health   Tobacco Use: High Risk (10/03/2024)   Patient History    Smoking Tobacco Use: Every Day    Smokeless Tobacco Use: Former    Passive Exposure: Not on Actuary Strain: Low Risk (02/09/2023)   Overall Financial Resource Strain (CARDIA)    Difficulty of Paying Living Expenses: Not hard at all  Food Insecurity: No Food Insecurity (10/05/2024)   Epic     Worried About Programme Researcher, Broadcasting/film/video in the Last Year: Never true    Ran Out of Food in the Last Year: Never true  Transportation Needs: Unmet Transportation Needs (10/05/2024)   Epic    Lack of Transportation (Medical): Yes    Lack of Transportation (Non-Medical): Yes  Physical Activity: Inactive (02/09/2023)   Exercise Vital Sign    Days of Exercise per Week: 0 days    Minutes of Exercise per Session: 0 min  Stress: No Stress Concern Present (02/09/2023)   [employer] of Occupational [employer] - Occupational Stress Questionnaire    Feeling of Stress : Not at all  Social Connections: Unknown (08/21/2023)   Received from [facility]   Social Network    Social Network: Not on file  Intimate Partner Violence: Not At Risk (10/05/2024)   Epic    Fear of Current or Ex-Partner: No    Emotionally Abused: No    Physically Abused: No    Sexually Abused: No  Depression (  PHQ2-9): Low Risk (02/09/2023)   Depression (PHQ2-9)    PHQ-2 Score: 0  Alcohol Screen: Not on file  Housing: Low Risk (10/05/2024)   Epic    Unable to Pay for Housing in the Last Year: No    Number of Times Moved in the Last Year: 0    Homeless in the Last Year: No  Utilities: Not At Risk (10/05/2024)   Epic    Threatened with loss of utilities: No  Health Literacy: Not on file    Physical Exam      Future Appointments  Date Time Provider Department Center  11/15/2024  3:00 PM [facility] PA/NP [facility] None  11/20/2024 10:15 AM Tobie Raj SAILOR, PT [facility]  12/19/2024 10:05 AM Aniceto Daphne CROME, NP [facility]       [name], Paramedic [phone] [facility] Paramedic  11/04/2024     [1]  Current Outpatient Medications:    acetaminophen  (TYLENOL ) 500 MG tablet, Take 1,000 mg by mouth every 6 (six) hours as needed for headache (pain)., Disp: , Rfl:    albuterol  (VENTOLIN  HFA) 108 (90 Base) MCG/ACT inhaler, USE 2 PUFFS BY MOUTH EVERY FOUR HOURS, AS NEEDED, FOR COUGHING/WHEEZING, Disp: 8.5 g,  Rfl: 0   allopurinol  (ZYLOPRIM ) 100 MG tablet, Take 100 mg by mouth daily., Disp: , Rfl:    amiodarone  (PACERONE ) 200 MG tablet, Take 1 tablet (200 mg total) by mouth 2 (two) times daily for 7 days, THEN 1 tablet (200 mg total) daily., Disp: 60 tablet, Rfl: 0   budesonide -formoterol  (SYMBICORT ) 160-4.5 MCG/ACT inhaler, Inhale 2 puffs into the lungs 2 (two) times daily., Disp: 1 each, Rfl: 2   Cholecalciferol  (VITAMIN D3) 50 MCG (2000 UT) capsule, Take 1 capsule by mouth every morning., Disp: , Rfl:    Continuous Glucose Receiver (DEXCOM G7 RECEIVER) DEVI, 1 Device by Does not apply route continuous., Disp: 1 each, Rfl: 0   Continuous Glucose Sensor (DEXCOM G7 SENSOR) MISC, 1 Device by Does not apply route continuous., Disp: 9 each, Rfl: 3   docusate sodium  (COLACE) 100 MG capsule, Take 2 capsules (200 mg total) by mouth at bedtime., Disp: 10 capsule, Rfl: 0   ELIQUIS  5 MG TABS tablet, TAKE 1 TABLET (5 MG TOTAL) BY MOUTH 2 (TWO) TIMES DAILY., Disp: 60 tablet, Rfl: 6   folic acid  (FOLVITE ) 1 MG tablet, Take 1 tablet (1 mg total) by mouth daily., Disp: 30 tablet, Rfl: 0   Glucagon  (BAQSIMI  ONE PACK) 3 MG/DOSE POWD, Place 1 Device into the nose as needed (Low blood sugar with impaired consciousness)., Disp: 2 each, Rfl: 3   insulin  glargine (LANTUS ) 100 UNIT/ML injection, Inject 58 Units into the skin at bedtime., Disp: , Rfl:    insulin  lispro (HUMALOG ) 100 UNIT/ML KwikPen, Inject 8 Units into the skin 3 (three) times daily with meals. Plus sliding scale, max dose 70 units /day, Disp: , Rfl:    levothyroxine  (SYNTHROID ) 50 MCG tablet, TAKE 1 TABLET (50 MCG TOTAL) BY MOUTH DAILY BEFORE BREAKFAST (AM), Disp: 30 tablet, Rfl: 0   Menthol -Camphor (ICY HOT PRO NO MESS EX), Apply 1 Application topically 3 (three) times daily as needed (arms, neuropathy lower extrimity, gout in hand.)., Disp: , Rfl:    metoprolol  succinate (TOPROL -XL) 25 MG 24 hr tablet, Take 1 tablet (25 mg total) by mouth daily., Disp: 60  tablet, Rfl: 0   NYAMYC  powder, Apply 1 Application topically 2 (two) times daily., Disp: , Rfl:    nystatin  cream (MYCOSTATIN ), Apply 1  Application topically 2 (two) times daily as needed for dry skin., Disp: , Rfl:    Omega-3 Fatty Acids (FISH OIL PO), Take 1 tablet by mouth daily., Disp: , Rfl:    pantoprazole  (PROTONIX ) 40 MG tablet, TAKE 1 TABLET (40 MG TOTAL) BY MOUTH DAILY(AM), Disp: 30 tablet, Rfl: 0   pantoprazole  (PROTONIX ) 40 MG tablet, Take 1 tablet (40 mg total) by mouth daily., Disp: 30 tablet, Rfl: 0   polyethylene glycol (MIRALAX  / GLYCOLAX ) 17 g packet, Take 17 g by mouth daily as needed for moderate constipation., Disp: 14 each, Rfl: 0   potassium chloride  SA (KLOR-CON  M) 20 MEQ tablet, Take 2 tablets (40 mEq total) by mouth every morning AND 1 tablet (20 mEq total) every evening., Disp: 200 tablet, Rfl: 3   rosuvastatin  (CRESTOR ) 40 MG tablet, Take 1 tablet (40 mg total) by mouth daily., Disp: 90 tablet, Rfl: 3   tirzepatide  (MOUNJARO ) 7.5 MG/0.5ML Pen, Inject 7.5 mg into the skin once a week. (Patient taking differently: Inject 7.5 mg into the skin every Friday.), Disp: 6 mL, Rfl: 1   torsemide  (DEMADEX ) 20 MG tablet, Take 3 tablets (60 mg total) by mouth every morning AND 2 tablets (40 mg total) every evening., Disp: , Rfl:    VITAMIN E PO, Take 1 tablet by mouth daily., Disp: , Rfl:  [2]  Allergies Allergen Reactions   Neurontin  [Gabapentin ] Nausea And Vomiting and Other (See Comments)    POSSIBLE SHAKING   Lyrica  [Pregabalin ] Other (See Comments)    Shaking

## 2024-11-11 ENCOUNTER — Other Ambulatory Visit (HOSPITAL_COMMUNITY): Payer: Self-pay | Admitting: Cardiology

## 2024-11-11 ENCOUNTER — Other Ambulatory Visit (HOSPITAL_COMMUNITY): Payer: Self-pay | Admitting: Family Medicine

## 2024-11-11 ENCOUNTER — Other Ambulatory Visit (HOSPITAL_COMMUNITY): Payer: Self-pay | Admitting: Adult Health

## 2024-11-11 ENCOUNTER — Other Ambulatory Visit (HOSPITAL_COMMUNITY): Payer: Self-pay

## 2024-11-11 ENCOUNTER — Other Ambulatory Visit: Payer: Self-pay | Admitting: "Endocrinology

## 2024-11-11 MED ORDER — APIXABAN 5 MG PO TABS: 5.0000 mg | ORAL_TABLET | Freq: Two times a day (BID) | ORAL | 5 refills | Status: DC

## 2024-11-11 NOTE — Progress Notes (Signed)
 Paramedicine Encounter    Patient ID: [name], female    DOB: [birth year], 63 y.o.   MRN: [id number]   Complaints-hand pain/leg pain   Edema-abd feels tight,   Compliance with meds-yes   Pill box filled-yes  If so, by whom-paramedic   Refills needed- Pantoprazole  Potassium Rosuvastatin  Torsemide  Mounjaro     Ordered these for delivery this week.     Pt reports she is doing ok other than her hand pain.  She got on cardiomems today but it did not send it to the portal. She said its acting up and taking a long time to turn on and turn off so I told her to contact the company and report this.  She said she would do this.  No bleeding or black stools.  Urinating normal.  Pt denies increased sob, no dizziness, no c/p, no palpitations.   Meds verified and pill box refilled.   Gave her the info to call [facility] to get that missed appoint resch b/c she was admitted.  PCP wanted to know nephrologist, so I wrote that down for her to give to PCP next time she goes.  Also reviewed her upcoming appointments.  Will f/u next week.  She has clinic appoint this Friday.    BP 130/60   Pulse 64   Resp 18   Wt 216 lb (98 kg)   LMP  (LMP Unknown)   SpO2 96%   BMI 38.26 kg/m  Weight yesterday-? Last visit weight-215  Patient Care Team: Campbell Reynolds, NP as PCP - [address] Court Dorn PARAS, MD as PCP - Cardiology (Cardiology) Rolan Ezra GORMAN, MD as PCP - Advanced Heart Failure (Cardiology) Inocencio Soyla Lunger, MD as PCP - Electrophysiology (Cardiology) Court Dorn PARAS, MD as Consulting Physician (Cardiology) Darlean Ozell NOVAK, MD as Consulting Physician (Pulmonary Disease) System, Provider Not In  Patient Active Problem List   Diagnosis Date Noted   Precordial pain 10/05/2024   Elevated troponin 10/05/2024   Acute HFrEF (heart failure with reduced ejection fraction) ([name]) 10/05/2024   High risk medication use 10/05/2024   Long term current use of antiarrhythmic drug  10/05/2024   Chronic kidney disease, stage IV (severe) ([name]) 10/05/2024   Anemia 10/05/2024   Melanotic stools 10/05/2024   Cardiomyopathy ([name]) 10/05/2024   Acute on chronic congestive heart failure ([name]) 10/04/2024   Acute on chronic anemia 10/04/2024   COPD with acute exacerbation ([name]) 10/03/2024   Acute cystitis 10/03/2024   History of CEA (carotid endarterectomy) 10/03/2024   Gout 10/03/2024   Anemia of chronic renal failure 08/06/2024   Persistent atrial fibrillation ([name]) 03/15/2024   Benign neoplasm of ascending colon 03/14/2024   Benign neoplasm of transverse colon 03/14/2024   Benign neoplasm of descending colon 03/14/2024   Benign neoplasm of sigmoid colon 03/14/2024   Chronic systolic congestive heart failure ([name]) 03/12/2024   Melena 03/12/2024   Heme positive stool 03/11/2024   GI bleed 03/08/2024   History of CAD (coronary artery disease) 03/08/2024   CKD (chronic kidney disease), stage IV ([name]) 03/08/2024   Continuous dependence on cigarette smoking 03/08/2024   GAD (generalized anxiety disorder) 03/08/2024   HFrEF (heart failure with reduced ejection fraction) ([name]) 10/11/2023   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) and diastolic (congestive) heart failure ([name]) 10/10/2023   Pre-ulcerative calluses 04/25/2022   PAF (paroxysmal atrial fibrillation) ([name]) 05/05/2021   COPD (chronic obstructive pulmonary disease) ([name]) 05/05/2021   OSA (obstructive sleep apnea) 05/05/2021   Stage 3b chronic kidney disease (  [name]) 05/05/2021   Pressure injury of skin 05/04/2021   Diabetic nephropathy ([name]) 01/02/2021   Low back pain 06/01/2020   Hyperlipidemia 04/03/2020   Peripheral artery disease 12/26/2019   NICM (nonischemic cardiomyopathy) ([name]) 06/20/2019   Non-healing ulcer ([name]) 06/20/2019   Acute CHF (congestive heart failure) ([name]) 11/26/2018   Coagulation disorder 08/09/2017   Depression 07/21/2017   At risk for adverse drug reaction 06/20/2017   Peripheral  neuropathy 06/20/2017   S/P transmetatarsal amputation of foot, right ([name]) 06/05/2017   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) 05/19/2017   Obesity, class 2 02/24/2016   Anticoagulation management encounter 02/10/2016   Chronic sinus bradycardia 01/12/2016   Essential hypertension 12/22/2015   Demand ischemia ([name])    Acute on chronic diastolic CHF (congestive heart failure) ([name])    Symptomatic anemia 11/08/2015   Hypokalemia 11/08/2015   Tobacco abuse 10/23/2015   Coronary artery disease    DOE (dyspnea on exertion) 04/29/2015   Paroxysmal atrial fibrillation ([name]) 01/16/2015   Carotid artery stenosis 01/16/2015   Insomnia 02/03/2014   S/P peripheral artery angioplasty - TurboHawk atherectomy; R SFA 09/11/2013    Class: Acute   Leg pain, bilateral 08/19/2013   Hypothyroidism 07/31/2013   History of cocaine abuse ([name]) 06/13/2013   Long term current use of anticoagulant therapy 05/20/2013   Alcohol abuse    Narcotic abuse ([name])    Marijuana abuse    Alcoholic cirrhosis ([name])    Insulin  dependent type 2 diabetes mellitus ([name])    Current Medications[1] Allergies[2]    Social History   Socioeconomic History   Marital status: Single    Spouse name: Not on file   Number of children: 1   Years of education: 12   Highest education level: 12th grade  Occupational History   Occupation: disabled  Tobacco Use   Smoking status: Every Day    Current packs/day: 1.00    Average packs/day: 1 pack/day for 44.0 years (44.0 ttl pk-yrs)    Types: E-cigarettes, Cigarettes   Smokeless tobacco: Former    Types: Snuff  Vaping Use   Vaping status: Former   Devices: 11/26/2018 stopped months ago  Substance and Sexual Activity   Alcohol use: Not Currently    Comment: occ   Drug use: Yes    Types: Crack cocaine, Marijuana, Oxycodone    Sexual activity: Not Currently  Other Topics Concern   Not on file  Social History  Narrative   ** Merged History Encounter **       Lives in [place], in motel with sister.  They are looking to move but don't have a place to go yet.     Social Drivers of Health   Tobacco Use: High Risk (10/03/2024)   Patient History    Smoking Tobacco Use: Every Day    Smokeless Tobacco Use: Former    Passive Exposure: Not on Actuary Strain: Low Risk (02/09/2023)   Overall Financial Resource Strain (CARDIA)    Difficulty of Paying Living Expenses: Not hard at all  Food Insecurity: No Food Insecurity (10/05/2024)   Epic    Worried About Radiation Protection Practitioner of Food in the Last Year: Never true    Ran Out of Food in the Last Year: Never true  Transportation Needs: Unmet Transportation Needs (10/05/2024)   Epic    Lack of Transportation (Medical): Yes    Lack of Transportation (Non-Medical): Yes  Physical Activity: Inactive (02/09/2023)   Exercise  Vital Sign    Days of Exercise per Week: 0 days    Minutes of Exercise per Session: 0 min  Stress: No Stress Concern Present (02/09/2023)   [employer] of Occupational Health - Occupational Stress Questionnaire    Feeling of Stress : Not at all  Social Connections: Unknown (08/21/2023)   Received from [facility]   Social Network    Social Network: Not on file  Intimate Partner Violence: Not At Risk (10/05/2024)   Epic    Fear of Current or Ex-Partner: No    Emotionally Abused: No    Physically Abused: No    Sexually Abused: No  Depression (PHQ2-9): Low Risk (02/09/2023)   Depression (PHQ2-9)    PHQ-2 Score: 0  Alcohol Screen: Not on file  Housing: Low Risk (10/05/2024)   Epic    Unable to Pay for Housing in the Last Year: No    Number of Times Moved in the Last Year: 0    Homeless in the Last Year: No  Utilities: Not At Risk (10/05/2024)   Epic    Threatened with loss of utilities: No  Health Literacy: Not on file    Physical Exam      Future Appointments  Date Time Provider Department Center  11/15/2024   3:00 PM [facility] PA/NP [facility] None  11/20/2024 10:15 AM Tobie Raj SAILOR, PT [facility]  12/19/2024 10:05 AM Aniceto Daphne CROME, NP [facility]       [name], Paramedic [phone] [facility] Paramedic  11/11/2024     [1]  Current Outpatient Medications:    acetaminophen  (TYLENOL ) 500 MG tablet, Take 1,000 mg by mouth every 6 (six) hours as needed for headache (pain)., Disp: , Rfl:    albuterol  (VENTOLIN  HFA) 108 (90 Base) MCG/ACT inhaler, USE 2 PUFFS BY MOUTH EVERY FOUR HOURS, AS NEEDED, FOR COUGHING/WHEEZING, Disp: 8.5 g, Rfl: 0   allopurinol  (ZYLOPRIM ) 100 MG tablet, Take 100 mg by mouth daily., Disp: , Rfl:    amiodarone  (PACERONE ) 200 MG tablet, Take 1 tablet (200 mg total) by mouth 2 (two) times daily for 7 days, THEN 1 tablet (200 mg total) daily., Disp: 60 tablet, Rfl: 0   apixaban  (ELIQUIS ) 5 MG TABS tablet, Take 1 tablet (5 mg total) by mouth 2 (two) times daily., Disp: 60 tablet, Rfl: 5   budesonide -formoterol  (SYMBICORT ) 160-4.5 MCG/ACT inhaler, Inhale 2 puffs into the lungs 2 (two) times daily., Disp: 1 each, Rfl: 2   Cholecalciferol  (VITAMIN D3) 50 MCG (2000 UT) capsule, Take 1 capsule by mouth every morning., Disp: , Rfl:    Continuous Glucose Receiver (DEXCOM G7 RECEIVER) DEVI, 1 Device by Does not apply route continuous., Disp: 1 each, Rfl: 0   Continuous Glucose Sensor (DEXCOM G7 SENSOR) MISC, 1 Device by Does not apply route continuous., Disp: 9 each, Rfl: 3   docusate sodium  (COLACE) 100 MG capsule, Take 2 capsules (200 mg total) by mouth at bedtime., Disp: 10 capsule, Rfl: 0   folic acid  (FOLVITE ) 1 MG tablet, Take 1 tablet (1 mg total) by mouth daily., Disp: 30 tablet, Rfl: 0   Glucagon  (BAQSIMI  ONE PACK) 3 MG/DOSE POWD, Place 1 Device into the nose as needed (Low blood sugar with impaired consciousness)., Disp: 2 each, Rfl: 3   insulin  glargine (LANTUS ) 100 UNIT/ML injection, Inject 58 Units into the skin at bedtime., Disp: , Rfl:    insulin  lispro  (HUMALOG ) 100 UNIT/ML KwikPen, Inject 8 Units into the skin 3 (three) times daily with  meals. Plus sliding scale, max dose 70 units /day, Disp: , Rfl:    levothyroxine  (SYNTHROID ) 50 MCG tablet, TAKE 1 TABLET (50 MCG TOTAL) BY MOUTH DAILY BEFORE BREAKFAST (AM), Disp: 30 tablet, Rfl: 0   Menthol -Camphor (ICY HOT PRO NO MESS EX), Apply 1 Application topically 3 (three) times daily as needed (arms, neuropathy lower extrimity, gout in hand.)., Disp: , Rfl:    metoprolol  succinate (TOPROL -XL) 25 MG 24 hr tablet, Take 1 tablet (25 mg total) by mouth daily., Disp: 60 tablet, Rfl: 0   metoprolol  succinate (TOPROL -XL) 50 MG 24 hr tablet, TAKE 1 TABLET BY MOUTH ONCE DAILY *NEW PRESCRIPTION REQUEST*, Disp: 90 tablet, Rfl: 10   NYAMYC  powder, Apply 1 Application topically 2 (two) times daily., Disp: , Rfl:    nystatin  cream (MYCOSTATIN ), Apply 1 Application topically 2 (two) times daily as needed for dry skin., Disp: , Rfl:    Omega-3 Fatty Acids (FISH OIL PO), Take 1 tablet by mouth daily., Disp: , Rfl:    pantoprazole  (PROTONIX ) 40 MG tablet, TAKE 1 TABLET (40 MG TOTAL) BY MOUTH DAILY(AM), Disp: 30 tablet, Rfl: 0   pantoprazole  (PROTONIX ) 40 MG tablet, Take 1 tablet (40 mg total) by mouth daily., Disp: 30 tablet, Rfl: 0   polyethylene glycol (MIRALAX  / GLYCOLAX ) 17 g packet, Take 17 g by mouth daily as needed for moderate constipation., Disp: 14 each, Rfl: 0   potassium chloride  SA (KLOR-CON  M) 20 MEQ tablet, Take 2 tablets (40 mEq total) by mouth every morning AND 1 tablet (20 mEq total) every evening., Disp: 200 tablet, Rfl: 3   rosuvastatin  (CRESTOR ) 40 MG tablet, TAKE 1 TABLET BY MOUTH DAILY *NEW PRESCRIPTION REQUEST*, Disp: 90 tablet, Rfl: 10   tirzepatide  (MOUNJARO ) 7.5 MG/0.5ML Pen, Inject 7.5 mg into the skin once a week. (Patient taking differently: Inject 7.5 mg into the skin every Friday.), Disp: 6 mL, Rfl: 1   torsemide  (DEMADEX ) 20 MG tablet, TAKE (3) TABLETS BY MOUTH TWICE DAILY *NEW PRESCRIPTION  REQUEST*, Disp: 540 tablet, Rfl: 10   VITAMIN E PO, Take 1 tablet by mouth daily., Disp: , Rfl:  [2]  Allergies Allergen Reactions   Neurontin  [Gabapentin ] Nausea And Vomiting and Other (See Comments)    POSSIBLE SHAKING   Lyrica  [Pregabalin ] Other (See Comments)    Shaking

## 2024-11-12 ENCOUNTER — Other Ambulatory Visit (HOSPITAL_COMMUNITY): Payer: Self-pay | Admitting: Cardiology

## 2024-11-12 ENCOUNTER — Other Ambulatory Visit: Payer: Self-pay | Admitting: Cardiovascular Disease

## 2024-11-12 DIAGNOSIS — I5020 Unspecified systolic (congestive) heart failure: Secondary | ICD-10-CM

## 2024-11-12 NOTE — Telephone Encounter (Signed)
 Requested Prescriptions   Pending Prescriptions Disp Refills   Alcohol Swabs (ALCOHOL PREP) 70 % PADS [Pharmacy Med Name: ALCOHOL SWABS 100CT 70 Pad] 100 each 10    Sig: USE TO CLEAN SKIN *NEW PRESCRIPTION REQUEST*

## 2024-11-13 ENCOUNTER — Telehealth (HOSPITAL_COMMUNITY): Payer: Self-pay

## 2024-11-13 NOTE — Telephone Encounter (Signed)
 Called to confirm/remind patient of their appointment at [facility] on 11/15/24.   Appointment:   [] Confirmed  [x] Left mess   [] No answer/No voice mail  [] VM Full/unable to leave message  [] Phone not in service  And to bring in all medications and/or complete list.

## 2024-11-13 NOTE — Progress Notes (Signed)
 Patient ID: [name], female   DOB: 05/15/1961, 63 y.o.   MRN: [id number]    Advanced Heart Failure Clinic Note   PCP: [facility] PV: Dr. Court HF Cardiology: Dr. Rolan  HPI: [name] is a 63 y.o. with history of PAD, carotid stenosis s/p left CEA, CAD, chronic systolic CHF, paroxysmal atrial fibrillation, bradycardia, and prior substance abuse.   In 5/18, she had peripheral angiography with atherectomy of right SFA.  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 In 8/20, she had peripheral angiography showing totally occluded left SFA.  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 Oris.    Echo 1/21 EF 50%, mild LVH, normal RV, PASP 60 mmHg.  Echo 4/22 EF 55-60% with basal to mid inferolateral hypokinesis.   Underwent PV angiogram with Dr. Court 7/22. She had drug-coated balloon angioplasty of the right SFA after being admitted for critical ischemia of the right foot.  Repeat peripheral arterial dopplers in 8/22 still showed severe right SFA disease.   Echo 4/24 EF 50-55%, RV normal.  Admitted 9/24 with UTI/sepsis. Required ICU care, BiPap and abx. Underwent RLE angiogram with femoropopliteal angioplasty and stenting.  Two admissions in 11/24 for AF/RVR s/p DCCV. EF was 45-50%, nl RV. She was d/c'd on Torsemide  100 bid w/o weekly metolazone  and readmitted for volume overload. She was d/c'd with prn Metolazone , however CardioMems reading (PA 31) suggested she was not diuresed at discharge. Was instructed to use Furosicx at home. At follow up PA 36, she was switched back to Torsemide  with metolazone .  Admitted 4/25 for GIB and ABLA. Hgb 5.3 on admission. Transfused. Eliquis  held. Underwent EGD: Esophageal plaques were found consistent with candidiasis.  Normal stomach. Started on fluconazole .   Normal examined duodenal. No source of bleeding on endoscopy, underwent colonoscopy, multiples polyps found and had  polypectomy. GI recommended capsule endoscopy in the future if recurrence of bleeding. Resumed Eliquis .   Echo in 4/25 showed EF 35-40%, mild RV dysfunction, PASP 56, moderate MR.   Admitted 11/25 with GIB and a/c HF. Diuresed and received PRBCs. Poor candidate for scope per GI, managed medically. Had AF with RVR, started on amiodarone  gtt & converted to NSR. Echo showed EF 30-35%, RV normal, moderate to severe MR, moderate TR. Seen by cardiology, no plans for cath, planning outpatient work up for Watchman, Eliquis  restarted at discharge. She was seen my PMT and elected for DNAR/DNI, MOST form completed. She was discharged home, weight 223 lbs.  Today she returns for post hospital HF follow up. Overall feeling fine. Uses a walker in the house, not very mobile. Limited physically by sciatic pain but no SOB with ADLs or walking on flat ground. Denies palpitations, abnormal bleeding, CP, dizziness, edema, or PND/Orthopnea. Appetite ok. Weight at home 216 pounds. Taking all medications. Smokes 2-3 cigs/day.  ECG (personally reviewed): AF 67 bpm  ReDs reading: 35 %, normal  Cardiomems (personally reviewed): goal 25, PAD 34 today  Labs (5/25): K 3.7, creatinine 1.78 Labs (6/25): K 5.0, creatinine 2.45, normal TSH, normal LFTs Labs (9/25): K 4, creatinine 2.7 Labs (11/25): K 3.6, creatinine 2.10   PMH: 1. Carotid stenosis: Known occluded right carotid.  Left CEA in 4/16.  - Carotid dopplers (7/21): CTO RICA, mild disease LICA.  - Carotid dopplers (7/22): CTO RICA, 1-39% LICA.  - Carotid dopplers (3/23): Totally occluded RICA, 1-39% LICA.  2. CAD: LHC in 12/15  with 80% stenosis in small OM1, nonobstructive disease in other territories.  3. Chronic systolic CHF: Nonischemic cardiomyopathy (?due to ETOH or prior drug abuse).  She has a Cardiomems.  - Echo (1/17) with EF 45%, mild LV hypertrophy, moderate diastolic dysfunction, inferolateral severe hypokinesis, mildly decreased RV systolic function.   - Echo (1/18): EF 40-45%, mild LVH, normal RV size and systolic function.  - Echo (1/21): EF 50%, mild LVH, normal RV, PASP 60 mmHg.  - Echo (4/22): EF 55-60%, basal-mid inferolateral hypokinesis with grade II diastolic dysfunction, RV normal, PASP 42 - Echo (4/24): EF 50-55%.  - Echo (11/24): EF 45-50%, normal RV, mild MR, anterolateral akinesis. - Echo (4/25): EF 35-40%, mild RV dysfunction, PASP 56, moderate MR.  - Echo 11/25: EF 30-35%, RV normal, moderate to severe MR, moderate TR. 4. Atrial fibrillation: Paroxysmal.   - DCCV 11/24, 11/25 5. Type II diabetes 6. CKD: Stage III-IV. 7. COPD: Smokes 1/4-1/2 ppd.  8. 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  No longer drinks.  9. Hypothyroidism 10. PAD: Atherectomy SFA in 2014 (Dr Court).  Peripheral arterial dopplers (2/17) with focal 75-99% proximal right SFA stenosis, occluded mid-distal right SFA, chronic occlusion of all runoff arteries on right. - In 5/18, she had peripheral angiography with atherectomy of right SFA.   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  - 7/18 ABIs showed 0.65 on right, 0.31 on left.   - Peripheral angiography 8/20: Totally occluded left SFA.  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 Oris.   - ABIs (2/21): 0.44 R, 0.99 L.  - Balloon angioplasty right SFA 7/22.  - ABIs (8/22): severe right SFA disease.  - peripheral arterial dopplers (2/23): 50-74% right SFA and right popliteal. Stable.  - s/p RLE angiogram with femoropopliteal angioplasty and stenting 9/24 - ABIs (11/24): 0.6 on right, 1.03 on left.  11. Anemia: Fe deficiency.  12. Prior cocaine abuse.  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  14. OSA: Has not been able to tolerate CPAP.  15. Diabetic neuropathy.    SH: Lives with sister.  Prior cocaine abuse.  Prior ETOH abuse.  Quit smoking in 1/17, restarted, and quit again in 4/19, restarted  again.   FH: CAD  Review of systems complete and found to be negative unless listed in HPI.    Current Outpatient Medications  Medication Sig Dispense Refill   acetaminophen  (TYLENOL ) 500 MG tablet Take 1,000 mg by mouth every 6 (six) hours as needed for headache (pain).     albuterol  (VENTOLIN  HFA) 108 (90 Base) MCG/ACT inhaler USE 2 PUFFS BY MOUTH EVERY FOUR HOURS, AS NEEDED, FOR COUGHING/WHEEZING 8.5 g 0   Alcohol Swabs (ALCOHOL PREP) 70 % PADS USE TO CLEAN SKIN *NEW PRESCRIPTION REQUEST* 100 each 10   allopurinol  (ZYLOPRIM ) 100 MG tablet Take 100 mg by mouth daily.     amiodarone  (PACERONE ) 200 MG tablet TAKE 1 TABLET BY MOUTH ONCE DAILY *NEW PRESCRIPTION REQUEST* 90 tablet 3   budesonide -formoterol  (SYMBICORT ) 160-4.5 MCG/ACT inhaler Inhale 2 puffs into the lungs 2 (two) times daily. 1 each 2   Cholecalciferol  (VITAMIN D3) 50 MCG (2000 UT) capsule Take 1 capsule by mouth every morning.     Continuous Glucose Receiver (DEXCOM G7 RECEIVER) DEVI 1 Device by Does not apply route continuous. 1 each 0   Continuous Glucose Sensor (DEXCOM G7 SENSOR) MISC 1 Device by Does not  apply route continuous. 9 each 3   docusate sodium  (COLACE) 100 MG capsule Take 2 capsules (200 mg total) by mouth at bedtime. 10 capsule 0   ELIQUIS  5 MG TABS tablet TAKE ONE (1) TABLET BY MOUTH TWICE DAILY *NEW PRESCRIPTION REQUEST* 180 tablet 10   folic acid  (FOLVITE ) 1 MG tablet Take 1 tablet (1 mg total) by mouth daily. 30 tablet 0   Glucagon  (BAQSIMI  ONE PACK) 3 MG/DOSE POWD Place 1 Device into the nose as needed (Low blood sugar with impaired consciousness). 2 each 3   insulin  glargine (LANTUS ) 100 UNIT/ML injection Inject 58 Units into the skin at bedtime.     insulin  lispro (HUMALOG ) 100 UNIT/ML KwikPen Inject 8 Units into the skin 3 (three) times daily with meals. Plus sliding scale, max dose 70 units /day     isosorbide -hydrALAZINE  (BIDIL ) 20-37.5 MG tablet TAKE 1 TABLET BY MOUTH THREE TIMES DAILY *NEW PRESCRIPTION  REQUEST* 270 tablet 10   levothyroxine  (SYNTHROID ) 50 MCG tablet TAKE 1 TABLET (50 MCG TOTAL) BY MOUTH DAILY BEFORE BREAKFAST (AM) 30 tablet 0   Menthol -Camphor (ICY HOT PRO NO MESS EX) Apply 1 Application topically 3 (three) times daily as needed (arms, neuropathy lower extrimity, gout in hand.).     metoprolol  succinate (TOPROL -XL) 25 MG 24 hr tablet Take 1 tablet (25 mg total) by mouth daily. 60 tablet 0   metoprolol  succinate (TOPROL -XL) 50 MG 24 hr tablet TAKE 1 TABLET BY MOUTH ONCE DAILY *NEW PRESCRIPTION REQUEST* 90 tablet 10   NYAMYC  powder Apply 1 Application topically 2 (two) times daily.     nystatin  cream (MYCOSTATIN ) Apply 1 Application topically 2 (two) times daily as needed for dry skin.     Omega-3 Fatty Acids (FISH OIL PO) Take 1 tablet by mouth daily.     pantoprazole  (PROTONIX ) 40 MG tablet TAKE 1 TABLET (40 MG TOTAL) BY MOUTH DAILY(AM) 30 tablet 0   pantoprazole  (PROTONIX ) 40 MG tablet Take 1 tablet (40 mg total) by mouth daily. 30 tablet 0   polyethylene glycol (MIRALAX  / GLYCOLAX ) 17 g packet Take 17 g by mouth daily as needed for moderate constipation. 14 each 0   potassium chloride  SA (KLOR-CON  M) 20 MEQ tablet Take 2 tablets (40 mEq total) by mouth every morning AND 1 tablet (20 mEq total) every evening. 200 tablet 3   rosuvastatin  (CRESTOR ) 40 MG tablet TAKE 1 TABLET BY MOUTH DAILY *NEW PRESCRIPTION REQUEST* 90 tablet 10   tirzepatide  (MOUNJARO ) 7.5 MG/0.5ML Pen INJECT 7.5 MG SUBCUTANEOUSLY ONCE WEEKLY *NEW PRESCRIPTION REQUEST* 6 mL 0   torsemide  (DEMADEX ) 20 MG tablet TAKE (3) TABLETS BY MOUTH TWICE DAILY *NEW PRESCRIPTION REQUEST* 540 tablet 10   VITAMIN E PO Take 1 tablet by mouth daily.     No current facility-administered medications for this encounter.   BP (!) 148/72   Pulse 68   Ht 5' 3 (1.6 m)   Wt 100.2 kg (221 lb)   LMP  (LMP Unknown)   SpO2 99%   BMI 39.15 kg/m   Wt Readings from Last 3 Encounters:  11/15/24 100.2 kg (221 lb)  11/11/24 98 kg (216  lb)  11/04/24 97.5 kg (215 lb)   Physical Exam: General:  NAD. No resp difficulty, arrived in [facility], chronically-ill appearing HEENT: Normal Neck: Supple. No JVD. Cor: Irregular rate & rhythm. No rubs, gallops or murmurs. Lungs: Clear Abdomen: Soft, obese, nontender, nondistended.  Extremities: No cyanosis, clubbing, rash, 1+ BLE edema Neuro: Alert & oriented x 3,  moves all 4 extremities w/o difficulty. Affect pleasant.  Assessment/Plan: 1. Chronic systolic CHF: LHC in 12/15 showing only 80% stenosis in small OM1. EF as low as 40-45% in the past. Echo 4/22 showed EF 55-60% with grade 2 diastolic dysfunction.  Echo (4/24) EF 50-55%.  Echo in 11/24 with EF 45-50%, normal RV, mild MR, anterolateral akinesis.  Echo in 4/25 showed EF 35-40%, mild RV dysfunction, PASP 56, moderate MR.  Echo 11/25 showed 30-35%, RV normal, moderate to severe MR, moderate TR.NYHA class III, exertion limited by sciatic pain and deconditioning. GDMT limited by cardio-renal syndrome. She is not volume overloaded by exam or CardioMems. ReDS 35%  - Continue torsemide  60 mg bid. BMET and BNP today. - Continue BiDil  1 tab tid. - Continue Toprol  XL 25 mg daily.  - Will not lower BP too aggressively with advanced CKD - Off Jardiance  due to severe UTIs. - Followed by Paramedicine in the community. Appreciate assistance. Seems to be helping with adherence. 2. CKD IV:  baseline SCr > 2 since 11/2023.  In the setting of cardio-renal disease and poorly controlled T2DM. - Off spironolactone  and ARB with creatinine rise.  - Followed by nephrology.  3. Atrial fibrillation: Paroxysmal. Recent admission with AF with RVR. Required IV amiodarone  with conversion to SR. Remains in rate controlled AF today. Would like to get back into normal rhythm. - Continue Eliquis  5 mg bid. Has not missed any doses of Eliquis . - Continue amiodarone  200 mg daily. Will not increase dose today with HR in 60's. TSH and LFTs stable 10/25. She will need regular  eye exam while on amiodarone . - Informed Consent   Shared Decision Making/Informed Consent The risks (stroke, cardiac arrhythmias rarely resulting in the need for a temporary or permanent pacemaker, skin irritation or burns and complications associated with conscious sedation including aspiration, arrhythmia, respiratory failure and death), benefits (restoration of normal sinus rhythm) and alternatives of a direct current cardioversion were explained in detail to Karla. Price and she agrees to proceed.      - Hopefully she will not have further GIB, but may need to consider Watchman if we need to stop her AC. She has follow up with EP next month, can discuss further. 4. PAD: Not candidate for cilostazol with CHF.  She has had a left fem-pop bypass in 8/20. She underwent PV angiogram with balloon angioplasty of the R SFA 7/22. S/p RLE fem-pop angioplasty and stenting in 9/24. Hx R TMA. - Continue rosuvastatin .  - She is on apixaban , so no ASA.  5. COPD:  Smoking 0.5 pack/day.  - Discussed cessation. Does not appear ready to quit. 6. HTN: BP mildly elevated, but generally well-controlled at home. - Continue current meds as above.  7.Type 2 diabetes: Poorly controlled historically.  - Now follows with Endocrine. - Insulin  dependent. Also on GLP1 8. CAD: LHC in 12/15 with 80% stenosis in small OM1, nonobstructive disease in other territories. No chest pain. - Continue rosuvastatin  + Vascepa  - Not on ASA with need for Eliquis . 9. Carotid stenosis: Stable 4/24 dopplers.  Followed by [facility].  10. Obesity: Body mass index is 39.15 kg/m. - On Mounjaro  11. Recent GIB: no further bleeding. If bleeds again, will need to stop AC and discuss Watchman. - Check CBC today  Follow up with APP after DCCV.  Harlene HERO [place], [place] 11/15/2024

## 2024-11-15 ENCOUNTER — Ambulatory Visit (HOSPITAL_COMMUNITY)
Admission: RE | Admit: 2024-11-15 | Discharge: 2024-11-15 | Disposition: A | Discharge status: Home / Self Care | Source: Ambulatory Visit | Attending: Family Medicine | Admitting: Family Medicine

## 2024-11-15 ENCOUNTER — Other Ambulatory Visit (HOSPITAL_COMMUNITY): Payer: Self-pay

## 2024-11-15 ENCOUNTER — Encounter (HOSPITAL_COMMUNITY): Payer: Self-pay

## 2024-11-15 VITALS — BP 148/72 | HR 68 | Ht 63.0 in | Wt 221.0 lb

## 2024-11-15 DIAGNOSIS — E669 Obesity, unspecified: Secondary | ICD-10-CM | POA: Diagnosis not present

## 2024-11-15 DIAGNOSIS — Z79899 Other long term (current) drug therapy: Secondary | ICD-10-CM | POA: Diagnosis not present

## 2024-11-15 DIAGNOSIS — I10 Essential (primary) hypertension: Secondary | ICD-10-CM | POA: Diagnosis not present

## 2024-11-15 DIAGNOSIS — Z7901 Long term (current) use of anticoagulants: Secondary | ICD-10-CM | POA: Insufficient documentation

## 2024-11-15 DIAGNOSIS — I739 Peripheral vascular disease, unspecified: Secondary | ICD-10-CM | POA: Diagnosis not present

## 2024-11-15 DIAGNOSIS — I1310 Hypertensive heart and chronic kidney disease without heart failure, with stage 1 through stage 4 chronic kidney disease, or unspecified chronic kidney disease: Secondary | ICD-10-CM | POA: Diagnosis not present

## 2024-11-15 DIAGNOSIS — J449 Chronic obstructive pulmonary disease, unspecified: Secondary | ICD-10-CM | POA: Diagnosis not present

## 2024-11-15 DIAGNOSIS — F17210 Nicotine dependence, cigarettes, uncomplicated: Secondary | ICD-10-CM | POA: Diagnosis not present

## 2024-11-15 DIAGNOSIS — I480 Paroxysmal atrial fibrillation: Secondary | ICD-10-CM | POA: Insufficient documentation

## 2024-11-15 DIAGNOSIS — Z955 Presence of coronary angioplasty implant and graft: Secondary | ICD-10-CM | POA: Diagnosis not present

## 2024-11-15 DIAGNOSIS — Z720 Tobacco use: Secondary | ICD-10-CM | POA: Diagnosis not present

## 2024-11-15 DIAGNOSIS — I081 Rheumatic disorders of both mitral and tricuspid valves: Secondary | ICD-10-CM | POA: Insufficient documentation

## 2024-11-15 DIAGNOSIS — Z66 Do not resuscitate: Secondary | ICD-10-CM | POA: Insufficient documentation

## 2024-11-15 DIAGNOSIS — Z8719 Personal history of other diseases of the digestive system: Secondary | ICD-10-CM

## 2024-11-15 DIAGNOSIS — N184 Chronic kidney disease, stage 4 (severe): Secondary | ICD-10-CM | POA: Diagnosis not present

## 2024-11-15 DIAGNOSIS — Z794 Long term (current) use of insulin: Secondary | ICD-10-CM | POA: Insufficient documentation

## 2024-11-15 DIAGNOSIS — E1122 Type 2 diabetes mellitus with diabetic chronic kidney disease: Secondary | ICD-10-CM | POA: Insufficient documentation

## 2024-11-15 DIAGNOSIS — M5430 Sciatica, unspecified side: Secondary | ICD-10-CM | POA: Diagnosis not present

## 2024-11-15 DIAGNOSIS — I2510 Atherosclerotic heart disease of native coronary artery without angina pectoris: Secondary | ICD-10-CM | POA: Insufficient documentation

## 2024-11-15 DIAGNOSIS — I5022 Chronic systolic (congestive) heart failure: Secondary | ICD-10-CM | POA: Diagnosis not present

## 2024-11-15 DIAGNOSIS — E1165 Type 2 diabetes mellitus with hyperglycemia: Secondary | ICD-10-CM | POA: Insufficient documentation

## 2024-11-15 LAB — CBC
HCT: 35.4 % — ABNORMAL LOW (ref 36.0–46.0)
Hemoglobin: 11.3 g/dL — ABNORMAL LOW (ref 12.0–15.0)
MCH: 29.7 pg (ref 26.0–34.0)
MCHC: 31.9 g/dL (ref 30.0–36.0)
MCV: 93.2 fL (ref 80.0–100.0)
Platelets: 250 K/uL (ref 150–400)
RBC: 3.8 MIL/uL — ABNORMAL LOW (ref 3.87–5.11)
RDW: 15.9 % — ABNORMAL HIGH (ref 11.5–15.5)
WBC: 10.5 K/uL (ref 4.0–10.5)
nRBC: 0 % (ref 0.0–0.2)

## 2024-11-15 LAB — BASIC METABOLIC PANEL WITH GFR
Anion gap: 12 (ref 5–15)
BUN: 51 mg/dL — ABNORMAL HIGH (ref 8–23)
CO2: 26 mmol/L (ref 22–32)
Calcium: 9.3 mg/dL (ref 8.9–10.3)
Chloride: 104 mmol/L (ref 98–111)
Creatinine, Ser: 1.68 mg/dL — ABNORMAL HIGH (ref 0.44–1.00)
GFR, Estimated: 34 mL/min — ABNORMAL LOW
Glucose, Bld: 120 mg/dL — ABNORMAL HIGH (ref 70–99)
Potassium: 4.4 mmol/L (ref 3.5–5.1)
Sodium: 141 mmol/L (ref 135–145)

## 2024-11-15 LAB — PRO BRAIN NATRIURETIC PEPTIDE: Pro Brain Natriuretic Peptide: 3211 pg/mL — ABNORMAL HIGH

## 2024-11-15 NOTE — Progress Notes (Signed)
"   ReDS Vest / Clip - 11/15/24 1500       ReDS Vest / Clip   Station Marker B    Ruler Value 34    ReDS Value Range Low volume    ReDS Actual Value 35         \ "

## 2024-11-15 NOTE — Progress Notes (Signed)
 H&V Care Navigation CSW Progress Note  Clinical Social Worker consulted to speak with pt regarding housing concerns.  Pt lives with her sister, Michaelle, and states that her nephew has lived with them the last 8 months and has been difficult to live with. States he has mental health issues and yells at them and is unhelpful with bills and keeps things dirty in their apartment.  Wants him gone but states law enforcement told them they couldn't make him leave.  Assume that they said this due to legal restrictions on having someone forced out of the home when they are residing there.  Provided pt with number to [facility] legal line to set up appt with lawyer to inquire about how to legally evict a tenant not on the lease.    No further questions or concern at this time.  Andriette HILARIO Leech, LCSW Clinical Social Worker Advanced Heart Failure Clinic Desk#: [phone] Cell#: [phone]

## 2024-11-15 NOTE — Patient Instructions (Signed)
 Good to see you today!    Labs done today, your results will be available in MyChart, we will contact you for abnormal readings.  Your physician recommends that you schedule a follow-up appointment 3 months(April) Call office in February to schedule an appointment   You are scheduled for a Cardioversion on Friday, January 16 with Dr. Rolan.  Please arrive at the [facility] (Main Entrance A) at [facility]: [address] [place], [place] [address] at 7:30 AM (This time is 1 hour(s) before your procedure to ensure your preparation).   Free valet parking service is available. You will check in at ADMITTING.   *Please Note: You will receive a call the day before your procedure to confirm the appointment time. That time may have changed from the original time based on the schedule for that day.*    DIET:  Nothing to eat or drink after midnight except a sip of water  with medications (see medication instructions below)  MEDICATION INSTRUCTIONS:        :1}HOLD: Tirzepatide  (Mounjaro , Zepbound ) for 7 days prior to the procedure. Last dose on Friday, January  2 .2026 Lantus  insulin  night before you need to take 29 units ( which is 1/2 your normal dose)  Morning of procedure hold humalog  insulin  and torsemide    Do not miss any Eliquis  doses FYI:  For your safety, and to allow us  to monitor your vital signs accurately during the surgery/procedure we request: If you have artificial nails, gel coating, SNS etc, please have those removed prior to your surgery/procedure. Not having the nail coverings /polish removed may result in cancellation or delay of your surgery/procedure.  Your support person will be asked to wait in the waiting room during your procedure.  It is OK to have someone drop you off and come back when you are ready to be discharged.  You cannot drive after the procedure and will need someone to drive you home.  Bring your insurance cards.  *Special Note: Every effort is  made to have your procedure done on time. Occasionally there are emergencies that occur at the hospital that may cause delays. Please be patient if a delay does occur.     If you have any questions or concerns before your next appointment please send us  a message through [place] or call our office at [phone].    TO LEAVE A MESSAGE FOR THE NURSE SELECT OPTION 2, PLEASE LEAVE A MESSAGE INCLUDING: YOUR NAME DATE OF BIRTH CALL BACK NUMBER REASON FOR CALL**this is important as we prioritize the call backs  YOU WILL RECEIVE A CALL BACK THE SAME DAY AS LONG AS YOU CALL BEFORE 4:00 PM At the Advanced [facility] Failure Clinic, you and your health needs are our priority. As part of our continuing mission to provide you with exceptional heart care, we have created designated Provider Care Teams. These Care Teams include your primary Cardiologist (physician) and Advanced Practice Providers (APPs- Physician Assistants and Nurse Practitioners) who all work together to provide you with the care you need, when you need it.   You may see any of the following providers on your designated Care Team at your next follow up: Dr Toribio Fuel Dr Ezra Rolan Dr. Morene Brownie Greig Mosses, NP Caffie Shed, [place] [facility] [place], [place] Beckey Coe, NP Jordan Lee, NP [name] Class, NP Tinnie Redman, PharmD Jaun Bash, PharmD   Please be sure to bring in all your medications bottles to every appointment.  Thank you for choosing [facility]

## 2024-11-18 ENCOUNTER — Encounter: Payer: Self-pay | Admitting: "Endocrinology

## 2024-11-18 ENCOUNTER — Other Ambulatory Visit (HOSPITAL_COMMUNITY): Payer: Self-pay

## 2024-11-18 ENCOUNTER — Ambulatory Visit: Admitting: "Endocrinology

## 2024-11-18 VITALS — BP 120/80 | HR 75 | Ht 63.0 in | Wt 222.0 lb

## 2024-11-18 DIAGNOSIS — Z794 Long term (current) use of insulin: Secondary | ICD-10-CM | POA: Diagnosis not present

## 2024-11-18 DIAGNOSIS — E11649 Type 2 diabetes mellitus with hypoglycemia without coma: Secondary | ICD-10-CM | POA: Diagnosis not present

## 2024-11-18 DIAGNOSIS — Z7985 Long-term (current) use of injectable non-insulin antidiabetic drugs: Secondary | ICD-10-CM | POA: Diagnosis not present

## 2024-11-18 DIAGNOSIS — E782 Mixed hyperlipidemia: Secondary | ICD-10-CM

## 2024-11-18 DIAGNOSIS — I5020 Unspecified systolic (congestive) heart failure: Secondary | ICD-10-CM

## 2024-11-18 LAB — POCT GLYCOSYLATED HEMOGLOBIN (HGB A1C): Hemoglobin A1C: 6.5 % — AB (ref 4.0–5.6)

## 2024-11-18 MED ORDER — FREESTYLE LIBRE 3 READER DEVI: 1.0000 | 0 refills | Status: AC

## 2024-11-18 MED ORDER — POTASSIUM CHLORIDE CRYS ER 20 MEQ PO TBCR: 40.0000 meq | EXTENDED_RELEASE_TABLET | Freq: Two times a day (BID) | ORAL | 3 refills | Status: DC

## 2024-11-18 MED ORDER — TORSEMIDE 20 MG PO TABS: 80.0000 mg | ORAL_TABLET | Freq: Two times a day (BID) | ORAL | 10 refills | Status: AC

## 2024-11-18 NOTE — Patient Instructions (Signed)
 Will recommend the following: Lantus  52 units at bedtime Humalog  30 units tid 15 min before meals  (Skip if sugar is less than 70, cut the dose in half if sugar is between 71-100) Humalog  correction scale: Use in addition to your meal time/short acting insulin  based on blood sugars as follows: 151 - 180: 1 unit 181 - 210: 2 units 211 - 240: 3 units 241 - 270: 4 units 271 - 300: 5 units 301 - 330: 6 units 331 - 360: 7 units 361 - 390: 8 units 391 - 420: 9 units  Mounjaro  7.5 mg weekly (max dose tolerated)  __________    Goals of DM therapy:  Morning Fasting blood sugar: 80-140  Blood sugar before meals: 80-140 Bed time blood sugar: 100-150  A1C <7%, limited only by hypoglycemia  1.Diabetes medications and their side effects discussed, including hypoglycemia    2. Check blood glucose:  a) Always check blood sugars before driving. Please see below (under hypoglycemia) on how to manage b) Check a minimum of 3 times/day or more as needed when having symptoms of hypoglycemia.   c) Try to check blood glucose before sleeping/in the middle of the night to ensure that it is remaining stable and not dropping less than 100 d) Check blood glucose more often if sick  3. Diet: a) 3 meals per day schedule b: Restrict carbs to 60-70 grams (4 servings) per meal c) Colorful vegetables - 3 servings a day, and low sugar fruit 2 servings/day Plate control method: 1/4 plate protein, 1/4 starch, 1/2 green, yellow, or red vegetables d) Avoid carbohydrate snacks unless hypoglycemic episode, or increased physical activity  4. Regular exercise as tolerated, preferably 3 or more hours a week  5. Hypoglycemia: a)  Do not drive or operate machinery without first testing blood glucose to assure it is over 90 mg%, or if dizzy, lightheaded, not feeling normal, etc, or  if foot or leg is numb or weak. b)  If blood glucose less than 70, take four 5gm Glucose tabs or 15-30 gm Glucose gel.  Repeat every 15  min as needed until blood sugar is >100 mg/dl. If hypoglycemia persists then call 911.   6. Sick day management: a) Check blood glucose more often b) Continue usual therapy if blood sugars are elevated.   7. Contact the doctor immediately if blood glucose is frequently <60 mg/dl, or an episode of severe hypoglycemia occurs (where someone had to give you glucose/  glucagon  or if you passed out from a low blood glucose), or if blood glucose is persistently >350 mg/dl, for further management  8. A change in level of physical activity or exercise and a change in diet may also affect your blood sugar. Check blood sugars more often and call if needed.  Instructions: 1. Bring glucose meter, blood glucose records on every visit for review 2. Continue to follow up with primary care physician and other providers for medical care 3. Yearly eye  and foot exam 4. Please get blood work done prior to the next appointment

## 2024-11-18 NOTE — Progress Notes (Signed)
 "   Outpatient Endocrinology Note Karla Birmingham, MD  11/18/2024   [name] [birth year] [id number]  Referring Provider: Campbell Reynolds, NP Primary Care Provider: Campbell Reynolds, NP Reason for consultation: Subjective   Assessment & Plan  Diagnoses and all orders for this visit:  Uncontrolled type 2 diabetes mellitus with hypoglycemia without coma ([name]) -     POCT glycosylated hemoglobin (Hb A1C) -     Continuous Glucose Receiver (FREESTYLE LIBRE 3 READER) DEVI; 1 Device by Does not apply route continuous.  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, No results found for: GFR Hba1c goal less than 7, current Hba1c is  Lab Results  Component Value Date   HGBA1C 6.5 (A) 11/18/2024   Will recommend the following: Lantus  52 units at bedtime Humalog  32 units tid 15 min before meals  (Skip if sugar is less than 70, cut the dose in half if sugar is between 71-100) Humalog  correction scale: Use in addition to your meal time/short acting insulin  based on blood sugars as follows: 151 - 180: 1 unit 181 - 210: 2 units 211 - 240: 3 units 241 - 270: 4 units 271 - 300: 5 units 301 - 330: 6 units 331 - 360: 7 units 361 - 390: 8 units 391 - 420: 9 units  Mounjaro  7.5 mg weekly (max dose tolerated)  06/25/24: Saw patient today after she arrived 20 min late for her appointment, whereas the cut off for us  in 10 min. She said her herlene information was a week old but in reality, it was last dated 32 weeks old (06/11/24). I cannot safely adjust given lack of adequate blood sugar data: explained it to the patient, given log sheet, ordered dexcom g7 as patient says her herlene is not covered, patient will follow up her PCP until she can provide me at least 1 week of blood sugar log to safely adjust her insulin  doses.  Note sent to PCP  Has  libre 01/25/24 Ordered DM education   No known contraindications/side effects to any of above medications Glucagon  discussed and prescribed with refills on 01/25/24   No history of MEN syndrome/medullary thyroid  cancer/pancreatitis or pancreatic cancer in self or family  -Last LD and Tg are as follows: Lab Results  Component Value Date   LDLCALC 79 02/29/2024    Lab Results  Component Value Date   TRIG 386 (H) 02/29/2024   -On rosuvastatin  40 mg every day, takes one a day fish oil  -Follow low fat diet and exercise   -Blood pressure goal <140/90 - Microalbumin/creatinine goal is < 30 -Last MA/Cr is as follows: Lab Results  Component Value Date   MICROALBUR 9.0 01/23/2024   -on ACE/ARB losartan  25 mg every day  -diet changes including salt restriction -limit eating outside -counseled BP targets per standards of diabetes care -uncontrolled blood pressure can lead to retinopathy, nephropathy and cardiovascular and atherosclerotic heart disease  Reviewed and counseled on: -A1C target -Blood sugar targets -Complications of uncontrolled diabetes  -Checking blood sugar before meals and bedtime and bring log next visit -All medications with mechanism of action and side effects -Hypoglycemia management: rule of 15's, Glucagon  Emergency Kit and medical alert ID -low-carb low-fat plate-method diet -At least 20 minutes of physical activity per day -Annual dilated retinal eye exam and foot exam -compliance and follow up needs -follow up as scheduled or earlier  if problem gets worse  Call if blood sugar is less than 70 or consistently above 250    Take a 15 gm snack of carbohydrate at bedtime before you go to sleep if your blood sugar is less than 100.    If you are going to fast after midnight for a test or procedure, ask your physician for instructions on how to reduce/decrease your insulin  dose.    Call if blood sugar is less than 70 or consistently above 250  -Treating a low  sugar by rule of 15  (15 gms of sugar every 15 min until sugar is more than 70) If you feel your sugar is low, test your sugar to be sure If your sugar is low (less than 70), then take 15 grams of a fast acting Carbohydrate (3-4 glucose tablets or glucose gel or 4 ounces of juice or regular soda) Recheck your sugar 15 min after treating low to make sure it is more than 70 If sugar is still less than 70, treat again with 15 grams of carbohydrate          Don't drive the hour of hypoglycemia  If unconscious/unable to eat or drink by mouth, use glucagon  injection or nasal spray baqsimi  and call 911. Can repeat again in 15 min if still unconscious.  Return in about 2 months (around 01/16/2025).   I have reviewed current medications, nurse's notes, allergies, vital signs, past medical and surgical history, family medical history, and social history for this encounter. Counseled patient on symptoms, examination findings, lab findings, imaging results, treatment decisions and monitoring and prognosis. The patient understood the recommendations and agrees with the treatment plan. All questions regarding treatment plan were fully answered.  Karla Birmingham, MD  11/18/2024    History of Present Illness [name] is a 64 y.o. year old female who presents for follow up for Type II diabetes mellitus.  [name] was first diagnosed around 2014.   Diabetes education +  Home diabetes regimen: Lantus  58 units at bedtime Humalog  30 units tid 30 min before meals, skips if BG<100 Mounjaro  7.5 mg/week   COMPLICATIONS -  MI/Stroke +  retinopathy +  neuropathy +  nephropathy  BLOOD SUGAR DATA BG 69-300s: few readings  CGM interpretation: At today's visit, we reviewed her CGM downloads. The full report is scanned in the media. Reviewing the CGM trends, BG are elevated in the afternoon to evening time, with lows overnight and sometimes between 4 to 6 AM.  BP 120/80   Pulse 75   Ht 5' 3 (1.6 m)    Wt 222 lb (100.7 kg)   LMP  (LMP Unknown)   SpO2 98%   BMI 39.33 kg/m    Constitutional: well developed, well nourished Head: normocephalic, atraumatic Eyes: sclera anicteric, no redness Neck: supple Lungs: normal respiratory effort Neurology: alert and oriented Skin: dry, no appreciable rashes Musculoskeletal: no appreciable defects Psychiatric: normal mood and affect Diabetic Foot Exam - Simple   No data filed   R foot all toes amputed, L toe one toe amputed    Current Medications Patient's Medications  New Prescriptions   CONTINUOUS GLUCOSE RECEIVER (FREESTYLE LIBRE 3 READER) DEVI    1 Device by Does not apply route continuous.  Previous Medications   ACETAMINOPHEN  (TYLENOL ) 500 MG TABLET    Take 1,000 mg by mouth every 6 (six) hours as needed for headache (pain).   ALBUTEROL  (VENTOLIN  HFA) 108 (90 BASE) MCG/ACT INHALER  USE 2 PUFFS BY MOUTH EVERY FOUR HOURS, AS NEEDED, FOR COUGHING/WHEEZING   ALCOHOL SWABS (ALCOHOL PREP) 70 % PADS    USE TO CLEAN SKIN *NEW PRESCRIPTION REQUEST*   ALLOPURINOL  (ZYLOPRIM ) 100 MG TABLET    Take 100 mg by mouth daily.   AMIODARONE  (PACERONE ) 200 MG TABLET    TAKE 1 TABLET BY MOUTH ONCE DAILY *NEW PRESCRIPTION REQUEST*   BUDESONIDE -FORMOTEROL  (SYMBICORT ) 160-4.5 MCG/ACT INHALER    Inhale 2 puffs into the lungs 2 (two) times daily.   CHOLECALCIFEROL  (VITAMIN D3) 50 MCG (2000 UT) CAPSULE    Take 1 capsule by mouth every morning.   CONTINUOUS GLUCOSE RECEIVER (DEXCOM G7 RECEIVER) DEVI    1 Device by Does not apply route continuous.   CONTINUOUS GLUCOSE SENSOR (DEXCOM G7 SENSOR) MISC    1 Device by Does not apply route continuous.   DOCUSATE SODIUM  (COLACE) 100 MG CAPSULE    Take 2 capsules (200 mg total) by mouth at bedtime.   ELIQUIS  5 MG TABS TABLET    TAKE ONE (1) TABLET BY MOUTH TWICE DAILY *NEW PRESCRIPTION REQUEST*   FOLIC ACID  (FOLVITE ) 1 MG TABLET    Take 1 tablet (1 mg total) by mouth daily.   GLUCAGON  (BAQSIMI  ONE PACK) 3 MG/DOSE POWD     Place 1 Device into the nose as needed (Low blood sugar with impaired consciousness).   INSULIN  GLARGINE (LANTUS ) 100 UNIT/ML INJECTION    Inject 58 Units into the skin at bedtime.   INSULIN  LISPRO (HUMALOG ) 100 UNIT/ML KWIKPEN    Inject 8 Units into the skin 3 (three) times daily with meals. Plus sliding scale, max dose 70 units /day   ISOSORBIDE -HYDRALAZINE  (BIDIL ) 20-37.5 MG TABLET    TAKE 1 TABLET BY MOUTH THREE TIMES DAILY *NEW PRESCRIPTION REQUEST*   LEVOTHYROXINE  (SYNTHROID ) 50 MCG TABLET    TAKE 1 TABLET (50 MCG TOTAL) BY MOUTH DAILY BEFORE BREAKFAST (AM)   MENTHOL -CAMPHOR (ICY HOT PRO NO MESS EX)    Apply 1 Application topically 3 (three) times daily as needed (arms, neuropathy lower extrimity, gout in hand.).   METOPROLOL  SUCCINATE (TOPROL -XL) 25 MG 24 HR TABLET    Take 1 tablet (25 mg total) by mouth daily.   NYAMYC  POWDER    Apply 1 Application topically 2 (two) times daily.   NYSTATIN  CREAM (MYCOSTATIN )    Apply 1 Application topically 2 (two) times daily as needed for dry skin.   OMEGA-3 FATTY ACIDS (FISH OIL PO)    Take 1 tablet by mouth daily.   PANTOPRAZOLE  (PROTONIX ) 40 MG TABLET    TAKE 1 TABLET (40 MG TOTAL) BY MOUTH DAILY(AM)   PANTOPRAZOLE  (PROTONIX ) 40 MG TABLET    Take 1 tablet (40 mg total) by mouth daily.   POLYETHYLENE GLYCOL (MIRALAX  / GLYCOLAX ) 17 G PACKET    Take 17 g by mouth daily as needed for moderate constipation.   ROSUVASTATIN  (CRESTOR ) 40 MG TABLET    TAKE 1 TABLET BY MOUTH DAILY *NEW PRESCRIPTION REQUEST*   TIRZEPATIDE  (MOUNJARO ) 7.5 MG/0.5ML PEN    INJECT 7.5 MG SUBCUTANEOUSLY ONCE WEEKLY *NEW PRESCRIPTION REQUEST*   VITAMIN E PO    Take 1 tablet by mouth daily.  Modified Medications   Modified Medication Previous Medication   POTASSIUM CHLORIDE  SA (KLOR-CON  M) 20 MEQ TABLET potassium chloride  SA (KLOR-CON  M) 20 MEQ tablet      Take 2 tablets (40 mEq total) by mouth 2 (two) times daily.    Take 2 tablets (40 mEq total)  by mouth every morning AND 1 tablet (20  mEq total) every evening.   TORSEMIDE  (DEMADEX ) 20 MG TABLET torsemide  (DEMADEX ) 20 MG tablet      Take 4 tablets (80 mg total) by mouth 2 (two) times daily.    TAKE (3) TABLETS BY MOUTH TWICE DAILY *NEW PRESCRIPTION REQUEST*  Discontinued Medications   No medications on file    Allergies Allergies  Allergen Reactions   Neurontin  [Gabapentin ] Nausea And Vomiting and Other (See Comments)    POSSIBLE SHAKING   Lyrica  [Pregabalin ] Other (See Comments)    Shaking     Past Medical History Past Medical History:  Diagnosis Date   Acute renal failure    Acute respiratory failure with hypoxia ([name]) 12/02/2019   Alcohol abuse    Alcoholic cirrhosis ([name])    Anemia    Anxiety    Arthritis    knees (11/26/2018)   B12 deficiency    CAD (coronary artery disease)    a. 11/10/2014 Cath: LM nl, LAD min irregs, D1 30 ost, D2 50d, LCX 64m, OM1 80 p/m (1.5 mm vessel), OM2 74m, RCA nondom 52m-->med rx.. Demand ischemia in the setting of rapid a-fib.   Cardiomyopathy ([name])    Carotid artery disease    a. 01/2015 Carotid Angio: RICA 100, LICA 95p; b. 02/2015 s/p L CEA; c. 05/2019 Carotid U/S: RICA 100. RECA >50. LICA 1-39%.   Cellulitis    lower extremities   Cellulitis in diabetic foot ([name]) 07/08/2019   CHF (congestive heart failure) ([name])    Chronic combined systolic and diastolic CHF (congestive heart failure) ([name])    a. 10/2014 Echo: EF 40-45%; b. 10/2018 Echo: EF 45-50%, gr2 DD; c. 11/2019 Echo: EF 50%, mild LVH, gr2 DD (restrictive), antlat HK, Nl RV fxn. Mild BAE. RVSP 59.42mmHg.   CKD (chronic kidney disease), stage III ([name])    Cocaine abuse ([name])    COPD (chronic obstructive pulmonary disease) ([name])    Critical ischemia of foot ([name]) 06/07/2021   Critical limb ischemia     Critical lower limb ischemia ([name])    Demand ischemia ([name]) 10/29/2014   Depression    Diabetes mellitus without complication ([name])    Diabetic peripheral neuropathy ([name])    DVT (deep venous thrombosis) ([name])     Dyspnea    Elevated troponin    a. Chronic elevation.   Fall 05/05/2021   Femoro-popliteal artery disease    Peripheral arterial disease/critical limb ischemia     GERD (gastroesophageal reflux disease)    Hyperlipemia    Hypertension    Hypokalemia    Hypomagnesemia    Hypothyroidism    Marijuana abuse    Narcotic abuse ([name])    Noncompliance    NSVT (nonsustained ventricular tachycardia) ([name])    Obesity    Osteomyelitis ([name]) 06/21/2019   PAF (paroxysmal atrial fibrillation) ([name])    Paroxysmal atrial tachycardia    Peripheral arterial disease    a. 01/2015 Angio/PTA: RSFA 99 (atherectomy/pta) - 1 vessel runoff via diff dzs peroneal; b. 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   Pneumonia    once or twice (11/26/2018)   Poorly controlled type 2 diabetes mellitus ([name])    Renal disorder    Renal insufficiency    a. 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   SIRS (systemic inflammatory response syndrome) ([name]) 04/06/2017   Sleep apnea    couldn't handle wearing the mask (11/26/2018)   Symptomatic bradycardia    a.  Avoid AV blocking agent per EP. Prev req temp wire in 2017.   Tobacco abuse    Wrist fracture, left, closed, initial encounter 01/29/2015    Past Surgical History Past Surgical History:  Procedure Laterality Date   ABDOMINAL AORTOGRAM N/A 06/26/2019   Procedure: ABDOMINAL AORTOGRAM;  Surgeon: Darron Deatrice LABOR, MD;  Location: [facility];  Service: Cardiovascular;  Laterality: N/A;   ABDOMINAL AORTOGRAM W/LOWER EXTREMITY N/A 06/07/2021   Procedure: ABDOMINAL AORTOGRAM W/LOWER EXTREMITY;  Surgeon: Court Dorn PARAS, MD;  Location: [facility];  Service: Cardiovascular;  Laterality: N/A;   ABDOMINAL AORTOGRAM W/LOWER EXTREMITY N/A 08/11/2023   Procedure: ABDOMINAL AORTOGRAM W/LOWER EXTREMITY;  Surgeon: Magda Debby SAILOR, MD;  Location: [facility];  Service: Cardiovascular;  Laterality: N/A;   AMPUTATION Right 06/14/2017   Procedure: Right  foot transmetatarsal amputation;  Surgeon: Harden Jerona GAILS, MD;  Location: [facility] OR;  Service: Orthopedics;  Laterality: Right;   AMPUTATION Left 06/28/2019   Procedure: AMPUTATION LEFT FIFTH TOE;  Surgeon: Oris Krystal FALCON, MD;  Location: [facility] OR;  Service: Vascular;  Laterality: Left;   AMPUTATION TOE Right 04/28/2017   Procedure: AMPUTATION OF RIGHT SECOND RAY;  Surgeon: Harden Jerona GAILS, MD;  Location: [facility] OR;  Service: Orthopedics;  Laterality: Right;   CARDIAC CATHETERIZATION     CARDIAC CATHETERIZATION N/A 01/12/2016   Procedure: Temporary Wire;  Surgeon: Lynwood Schilling, MD;  Location: [facility];  Service: Cardiovascular;  Laterality: N/A;   CARDIOVERSION  ~ 02/2013   twice    CARDIOVERSION N/A 09/26/2023   Procedure: CARDIOVERSION (CATH LAB);  Surgeon: Alvan Ronal BRAVO, MD;  Location: [facility] INVASIVE CV LAB;  Service: Cardiovascular;  Laterality: N/A;   CAROTID ANGIOGRAM N/A 01/15/2015   Procedure: CAROTID ANGIOGRAM;  Surgeon: Dorn PARAS Court, MD;  Location: [facility] CATH LAB;  Service: Cardiovascular;  Laterality: N/A;   COLONOSCOPY N/A 03/14/2024   Procedure: COLONOSCOPY;  Surgeon: Albertus Gordy HERO, MD;  Location: [facility] ENDOSCOPY;  Service: Gastroenterology;  Laterality: N/A;   DILATION AND CURETTAGE OF UTERUS  1988   ENDARTERECTOMY Left 02/19/2015   Procedure: LEFT CAROTID ENDARTERECTOMY ;  Surgeon: Gaile LELON New, MD;  Location: [facility] OR;  Service: Vascular;  Laterality: Left;   ESOPHAGOGASTRODUODENOSCOPY N/A 03/13/2024   Procedure: EGD (ESOPHAGOGASTRODUODENOSCOPY);  Surgeon: Albertus Gordy HERO, MD;  Location: [facility] ENDOSCOPY;  Service: Gastroenterology;  Laterality: N/A;   FEMORAL-TIBIAL BYPASS GRAFT Left 06/28/2019   Procedure: BYPASS GRAFT LEFT LEG FEMORAL TO ANTERIOR TIBIAL ARTERY using LEFT GREATER SAPHENOUS VEIN;  Surgeon: Oris Krystal FALCON, MD;  Location: [facility] OR;  Service: Vascular;  Laterality: Left;   LEFT HEART CATHETERIZATION WITH CORONARY ANGIOGRAM N/A 10/31/2014   Procedure: LEFT HEART CATHETERIZATION WITH  CORONARY ANGIOGRAM;  Surgeon: Lonni JONETTA Cash, MD;  Location: [facility] CATH LAB;  Service: Cardiovascular;  Laterality: N/A;   LOWER EXTREMITY ANGIOGRAM N/A 09/10/2013   Procedure: LOWER EXTREMITY ANGIOGRAM;  Surgeon: Dorn PARAS Court, MD;  Location: [facility] CATH LAB;  Service: Cardiovascular;  Laterality: N/A;   LOWER EXTREMITY ANGIOGRAM N/A 01/15/2015   Procedure: LOWER EXTREMITY ANGIOGRAM;  Surgeon: Dorn PARAS Court, MD;  Location: [facility] CATH LAB;  Service: Cardiovascular;  Laterality: N/A;   LOWER EXTREMITY ANGIOGRAPHY N/A 04/13/2017   Procedure: Lower Extremity Angiography;  Surgeon: Court Dorn PARAS, MD;  Location: [facility] INVASIVE CV LAB;  Service: Cardiovascular;  Laterality: N/A;   LOWER EXTREMITY ANGIOGRAPHY Left 06/26/2019   Procedure: LOWER EXTREMITY ANGIOGRAPHY;  Surgeon: Darron Deatrice LABOR, MD;  Location: [facility]  INVASIVE CV LAB;  Service: Cardiovascular;  Laterality: Left;   PERIPHERAL VASCULAR ATHERECTOMY Right 06/07/2021   Procedure: PERIPHERAL VASCULAR ATHERECTOMY;  Surgeon: Court Dorn PARAS, MD;  Location: [facility] INVASIVE CV LAB;  Service: Cardiovascular;  Laterality: Right;   PERIPHERAL VASCULAR BALLOON ANGIOPLASTY Left 06/26/2019   Procedure: PERIPHERAL VASCULAR BALLOON ANGIOPLASTY;  Surgeon: Darron Deatrice LABOR, MD;  Location: [facility];  Service: Cardiovascular;  Laterality: Left;  unable to cross lt sfa occlusion   PERIPHERAL VASCULAR INTERVENTION  04/13/2017   Procedure: Peripheral Vascular Intervention;  Surgeon: Court Dorn PARAS, MD;  Location: [facility] INVASIVE CV LAB;  Service: Cardiovascular;;   PERIPHERAL VASCULAR INTERVENTION  08/11/2023   Procedure: PERIPHERAL VASCULAR INTERVENTION;  Surgeon: Magda Debby SAILOR, MD;  Location: [facility];  Service: Cardiovascular;;   PRESSURE SENSOR/CARDIOMEMS N/A 02/05/2020   Procedure: PRESSURE SENSOR/CARDIOMEMS;  Surgeon: Rolan Ezra RAMAN, MD;  Location: [facility] INVASIVE CV LAB;  Service: Cardiovascular;  Laterality: N/A;   VEIN HARVEST Left 06/28/2019    Procedure: LEFT LEG GREATER SAPHENOUS VEIN HARVEST;  Surgeon: Oris Krystal FALCON, MD;  Location: [facility] OR;  Service: Vascular;  Laterality: Left;    Family History family history includes Breast cancer in her mother; Cancer in her mother; Clotting disorder in her mother; Diabetes in her mother; Emphysema in her sister; Heart attack in her mother; Heart disease in her father and mother; Hypertension in her father and mother.  Social History Social History   Socioeconomic History   Marital status: Single    Spouse name: Not on file   Number of children: 1   Years of education: 12   Highest education level: 12th grade  Occupational History   Occupation: disabled  Tobacco Use   Smoking status: Every Day    Current packs/day: 1.00    Average packs/day: 1 pack/day for 44.0 years (44.0 ttl pk-yrs)    Types: E-cigarettes, Cigarettes   Smokeless tobacco: Former    Types: Snuff  Vaping Use   Vaping status: Former   Devices: 11/26/2018 stopped months ago  Substance and Sexual Activity   Alcohol use: Not Currently    Comment: occ   Drug use: Yes    Types: Crack cocaine, Marijuana, Oxycodone    Sexual activity: Not Currently  Other Topics Concern   Not on file  Social History Narrative   ** Merged History Encounter **       Lives in [place], in motel with sister.  They are looking to move but don't have a place to go yet.     Social Drivers of Health   Tobacco Use: High Risk (11/18/2024)   Patient History    Smoking Tobacco Use: Every Day    Smokeless Tobacco Use: Former    Passive Exposure: Not on Actuary Strain: Low Risk (02/09/2023)   Overall Financial Resource Strain (CARDIA)    Difficulty of Paying Living Expenses: Not hard at all  Food Insecurity: No Food Insecurity (10/05/2024)   Epic    Worried About Programme Researcher, Broadcasting/film/video in the Last Year: Never true    Ran Out of Food in the Last Year: Never true  Transportation Needs: Unmet Transportation Needs (10/05/2024)    Epic    Lack of Transportation (Medical): Yes    Lack of Transportation (Non-Medical): Yes  Physical Activity: Inactive (02/09/2023)   Exercise Vital Sign    Days of Exercise per Week: 0 days    Minutes of Exercise per Session: 0 min  Stress: No  Stress Concern Present (02/09/2023)   [employer] of Occupational [employer] - Occupational Stress Questionnaire    Feeling of Stress : Not at all  Social Connections: Unknown (08/21/2023)   [facility]   Social Network    Social Network: Not on file  Intimate Partner Violence: Not At Risk (10/05/2024)   Epic    Fear of Current or Ex-Partner: No    Emotionally Abused: No    Physically Abused: No    Sexually Abused: No  Depression (PHQ2-9): Low Risk (02/09/2023)   Depression (PHQ2-9)    PHQ-2 Score: 0  Alcohol Screen: Not on file  Housing: Low Risk (10/05/2024)   Epic    Unable to Pay for Housing in the Last Year: No    Number of Times Moved in the Last Year: 0    Homeless in the Last Year: No  Utilities: Not At Risk (10/05/2024)   Epic    Threatened with loss of utilities: No  Health Literacy: Not on file    Lab Results  Component Value Date   HGBA1C 6.5 (A) 11/18/2024   HGBA1C 6.9 (A) 05/22/2024   HGBA1C 12.6 (H) 01/23/2024   Lab Results  Component Value Date   CHOL 163 02/29/2024   Lab Results  Component Value Date   HDL 37 (L) 02/29/2024   Lab Results  Component Value Date   LDLCALC 79 02/29/2024   Lab Results  Component Value Date   TRIG 386 (H) 02/29/2024   Lab Results  Component Value Date   CHOLHDL 4.4 02/29/2024   Lab Results  Component Value Date   CREATININE 1.68 (H) 11/15/2024   No results found for: GFR Lab Results  Component Value Date   MICROALBUR 9.0 01/23/2024      Component Value Date/Time   NA 141 11/15/2024 1613   NA 140 09/07/2022 1144   K 4.4 11/15/2024 1613   CL 104 11/15/2024 1613   CO2 26 11/15/2024 1613   GLUCOSE 120 (H) 11/15/2024 1613   BUN 51 (H)  11/15/2024 1613   BUN 26 (H) 09/07/2022 1144   CREATININE 1.68 (H) 11/15/2024 1613   CREATININE 2.01 (H) 01/23/2024 1444   CALCIUM  9.3 11/15/2024 1613   PROT 6.6 10/04/2024 0425   PROT 6.7 07/07/2022 1155   ALBUMIN  3.4 (L) 10/04/2024 0425   ALBUMIN  4.0 07/07/2022 1155   AST 24 10/04/2024 0425   ALT 20 10/04/2024 0425   ALKPHOS 90 10/04/2024 0425   BILITOT 0.6 10/04/2024 0425   BILITOT <0.2 07/07/2022 1155   GFRNONAA 34 (L) 11/15/2024 1613   GFRAA 39 (L) 06/08/2020 1130      Latest Ref Rng & Units 11/15/2024    4:13 PM 10/12/2024    2:13 AM 10/11/2024    2:11 AM  BMP  Glucose 70 - 99 mg/dL 879  891  787   BUN 8 - 23 mg/dL 51  58  54   Creatinine 0.44 - 1.00 mg/dL 8.31  7.89  7.83   Sodium 135 - 145 mmol/L 141  139  138   Potassium 3.5 - 5.1 mmol/L 4.4  3.6  3.9   Chloride 98 - 111 mmol/L 104  93  95   CO2 22 - 32 mmol/L 26  33  31   Calcium  8.9 - 10.3 mg/dL 9.3  8.9  8.8        Component Value Date/Time   WBC 10.5 11/15/2024 1613   RBC 3.80 (L) 11/15/2024 1613   HGB  11.3 (L) 11/15/2024 1613   HGB 10.5 (L) 05/25/2021 1444   HCT 35.4 (L) 11/15/2024 1613   HCT 35.5 05/25/2021 1444   PLT 250 11/15/2024 1613   PLT 368 05/25/2021 1444   MCV 93.2 11/15/2024 1613   MCV 88 05/25/2021 1444   MCH 29.7 11/15/2024 1613   MCHC 31.9 11/15/2024 1613   RDW 15.9 (H) 11/15/2024 1613   RDW 14.1 05/25/2021 1444   LYMPHSABS 0.4 (L) 10/06/2024 0117   LYMPHSABS 1.6 06/20/2019 1125   MONOABS 0.6 10/06/2024 0117   EOSABS 0.0 10/06/2024 0117   EOSABS 0.3 06/20/2019 1125   BASOSABS 0.0 10/06/2024 0117   BASOSABS 0.0 06/20/2019 1125     Parts of this note may have been dictated using voice recognition software. There may be variances in spelling and vocabulary which are unintentional. Not all errors are proofread. Please notify the dino if any discrepancies are noted or if the meaning of any statement is not clear.   "

## 2024-11-18 NOTE — Addendum Note (Signed)
 Encounter addended by: Jerona Dalton HERO, CMA on: 11/18/2024 9:26 AM  Actions taken: Order list changed

## 2024-11-18 NOTE — Progress Notes (Signed)
 Paramedicine Encounter    Patient ID: [name], female    DOB: 07-19-1961, 64 y.o.   MRN: [id number]   Complaints-hand pain   Edema-none   Compliance with meds-yes  Pill box filled-yes If so, by whom-paramedic   Refills needed-yes- all of them-mail order pharm reached out to local pharm about switching so pt advised local pharm she wanted to stay with [facility]  Advised pharm of this and he will gather all her meds that need refilling and send out.   Pt went to endo today. A1C was 7.   Pt was seen in clinic last week,  her bidil  was added back on, 1 tab TID. Per labs her torsemide  was increased to 80mg  BID and potassium to 40meq BID.  Need repeat labs in 10-14 days but she has cardioversion in 2 wks and labs may be done that day, if not will get them set up.    Pt denies increased sob, no dizziness, no c/p.  She has chronic pain to her hands and feet.   She has cardioversion on 1/16.  Will adjust pill box for med changes that are needed for this day.  She is aware of the insulin  dose changes for it.   Meds verified and pill box refilled.    She was confused as if meds needed to be held today b/c of cardioversion, so she didn't take anything today. Due to time of day she is starting with the afternoon dose of torsemide , potassium and bidil .    BP (!) 142/64   Pulse 70   Wt 216 lb (98 kg)   LMP  (LMP Unknown)   SpO2 (!) 18%   PF 98 L/min   BMI 38.26 kg/m  Weight yesterday--216 Last visit weight-216  Patient Care Team: Campbell Reynolds, NP as PCP - General Court Dorn PARAS, MD as PCP - Cardiology (Cardiology) Rolan Ezra GORMAN, MD as PCP - Advanced Heart Failure (Cardiology) Inocencio Soyla Lunger, MD as PCP - Electrophysiology (Cardiology) Court Dorn PARAS, MD as Consulting Physician (Cardiology) Darlean Ozell NOVAK, MD as Consulting Physician (Pulmonary Disease) System, Provider Not In  Patient Active Problem List   Diagnosis Date Noted   Precordial pain  10/05/2024   Elevated troponin 10/05/2024   Acute HFrEF (heart failure with reduced ejection fraction) ([name]) 10/05/2024   High risk medication use 10/05/2024   Long term current use of antiarrhythmic drug 10/05/2024   Chronic kidney disease, stage IV (severe) ([name]) 10/05/2024   Anemia 10/05/2024   Melanotic stools 10/05/2024   Cardiomyopathy ([name]) 10/05/2024   Acute on chronic congestive heart failure ([name]) 10/04/2024   Acute on chronic anemia 10/04/2024   COPD with acute exacerbation ([name]) 10/03/2024   Acute cystitis 10/03/2024   History of CEA (carotid endarterectomy) 10/03/2024   Gout 10/03/2024   Anemia of chronic renal failure 08/06/2024   Persistent atrial fibrillation ([name]) 03/15/2024   Benign neoplasm of ascending colon 03/14/2024   Benign neoplasm of transverse colon 03/14/2024   Benign neoplasm of descending colon 03/14/2024   Benign neoplasm of sigmoid colon 03/14/2024   Chronic systolic congestive heart failure ([name]) 03/12/2024   Melena 03/12/2024   Heme positive stool 03/11/2024   GI bleed 03/08/2024   History of CAD (coronary artery disease) 03/08/2024   CKD (chronic kidney disease), stage IV ([name]) 03/08/2024   Continuous dependence on cigarette smoking 03/08/2024   GAD (generalized anxiety disorder) 03/08/2024   HFrEF (heart failure with reduced ejection fraction) ([name]) 10/11/2023   Acute  on chronic combined systolic (congestive) and diastolic (congestive) heart failure ([name]) 10/10/2023   Pre-ulcerative calluses 04/25/2022   PAF (paroxysmal atrial fibrillation) ([name]) 05/05/2021   COPD (chronic obstructive pulmonary disease) ([name]) 05/05/2021   OSA (obstructive sleep apnea) 05/05/2021   Stage 3b chronic kidney disease ([name]) 05/05/2021   Pressure injury of skin 05/04/2021   Diabetic nephropathy ([name]) 01/02/2021   Low back pain 06/01/2020   Hyperlipidemia 04/03/2020   Peripheral artery disease 12/26/2019   NICM (nonischemic cardiomyopathy) ([name]) 06/20/2019    Non-healing ulcer ([name]) 06/20/2019   Acute CHF (congestive heart failure) ([name]) 11/26/2018   Coagulation disorder 08/09/2017   Depression 07/21/2017   At risk for adverse drug reaction 06/20/2017   Peripheral neuropathy 06/20/2017   S/P transmetatarsal amputation of foot, right ([name]) 06/05/2017   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) 05/19/2017   Obesity, class 2 02/24/2016   Anticoagulation management encounter 02/10/2016   Chronic sinus bradycardia 01/12/2016   Essential hypertension 12/22/2015   Demand ischemia ([name])    Acute on chronic diastolic CHF (congestive heart failure) ([name])    Symptomatic anemia 11/08/2015   Hypokalemia 11/08/2015   Tobacco abuse 10/23/2015   Coronary artery disease    DOE (dyspnea on exertion) 04/29/2015   Paroxysmal atrial fibrillation ([name]) 01/16/2015   Carotid artery stenosis 01/16/2015   Insomnia 02/03/2014   S/P peripheral artery angioplasty - TurboHawk atherectomy; R SFA 09/11/2013    Class: Acute   Leg pain, bilateral 08/19/2013   Hypothyroidism 07/31/2013   History of cocaine abuse ([name]) 06/13/2013   Long term current use of anticoagulant therapy 05/20/2013   Alcohol abuse    Narcotic abuse ([name])    Marijuana abuse    Alcoholic cirrhosis ([name])    Insulin  dependent type 2 diabetes mellitus ([name])    Current Medications[1] Allergies[2]    Social History   Socioeconomic History   Marital status: Single    Spouse name: Not on file   Number of children: 1   Years of education: 12   Highest education level: 12th grade  Occupational History   Occupation: disabled  Tobacco Use   Smoking status: Every Day    Current packs/day: 1.00    Average packs/day: 1 pack/day for 44.0 years (44.0 ttl pk-yrs)    Types: E-cigarettes, Cigarettes   Smokeless tobacco: Former    Types: Snuff  Vaping Use   Vaping status: Former   Devices: 11/26/2018 stopped months ago  Substance and  Sexual Activity   Alcohol use: Not Currently    Comment: occ   Drug use: Yes    Types: Crack cocaine, Marijuana, Oxycodone    Sexual activity: Not Currently  Other Topics Concern   Not on file  Social History Narrative   ** Merged History Encounter **       Lives in [place], in motel with sister.  They are looking to move but don't have a place to go yet.     Social Drivers of Health   Tobacco Use: High Risk (11/18/2024)   Patient History    Smoking Tobacco Use: Every Day    Smokeless Tobacco Use: Former    Passive Exposure: Not on Actuary Strain: Low Risk (02/09/2023)   Overall Financial Resource Strain (CARDIA)    Difficulty of Paying Living Expenses: Not hard at all  Food Insecurity: No Food Insecurity (10/05/2024)   Epic    Worried About Radiation Protection Practitioner of Food in the Last Year: Never  true    Ran Out of Food in the Last Year: Never true  Transportation Needs: Unmet Transportation Needs (10/05/2024)   Epic    Lack of Transportation (Medical): Yes    Lack of Transportation (Non-Medical): Yes  Physical Activity: Inactive (02/09/2023)   Exercise Vital Sign    Days of Exercise per Week: 0 days    Minutes of Exercise per Session: 0 min  Stress: No Stress Concern Present (02/09/2023)   [employer] of Occupational Health - Occupational Stress Questionnaire    Feeling of Stress : Not at all  Social Connections: Unknown (08/21/2023)   Received from [facility]   Social Network    Social Network: Not on file  Intimate Partner Violence: Not At Risk (10/05/2024)   Epic    Fear of Current or Ex-Partner: No    Emotionally Abused: No    Physically Abused: No    Sexually Abused: No  Depression (PHQ2-9): Low Risk (02/09/2023)   Depression (PHQ2-9)    PHQ-2 Score: 0  Alcohol Screen: Not on file  Housing: Low Risk (10/05/2024)   Epic    Unable to Pay for Housing in the Last Year: No    Number of Times Moved in the Last Year: 0    Homeless in the Last Year: No   Utilities: Not At Risk (10/05/2024)   Epic    Threatened with loss of utilities: No  Health Literacy: Not on file    Physical Exam      Future Appointments  Date Time Provider Department Center  11/20/2024 10:15 AM Tobie Raj SAILOR, PT [facility]  12/19/2024 10:05 AM Aniceto Daphne CROME, NP [facility]  01/14/2025 11:00 AM Dartha Ernst, MD [facility] None       [name], Paramedic [phone] [facility] Par[facility]ic  11/18/2024     [1]  Current Outpatient Medications:    allopurinol  (ZYLOPRIM ) 100 MG tablet, Take 100 mg by mouth daily., Disp: , Rfl:    amiodarone  (PACERONE ) 200 MG tablet, TAKE 1 TABLET BY MOUTH ONCE DAILY *NEW PRESCRIPTION REQUEST*, Disp: 90 tablet, Rfl: 3   budesonide -formoterol  (SYMBICORT ) 160-4.5 MCG/ACT inhaler, Inhale 2 puffs into the lungs 2 (two) times daily., Disp: 1 each, Rfl: 2   Cholecalciferol  (VITAMIN D3) 50 MCG (2000 UT) capsule, Take 1 capsule by mouth every morning., Disp: , Rfl:    ELIQUIS  5 MG TABS tablet, TAKE ONE (1) TABLET BY MOUTH TWICE DAILY *NEW PRESCRIPTION REQUEST*, Disp: 180 tablet, Rfl: 10   insulin  glargine (LANTUS ) 100 UNIT/ML injection, Inject 58 Units into the skin at bedtime., Disp: , Rfl:    insulin  lispro (HUMALOG ) 100 UNIT/ML KwikPen, Inject 8 Units into the skin 3 (three) times daily with meals. Plus sliding scale, max dose 70 units /day, Disp: , Rfl:    isosorbide -hydrALAZINE  (BIDIL ) 20-37.5 MG tablet, TAKE 1 TABLET BY MOUTH THREE TIMES DAILY *NEW PRESCRIPTION REQUEST*, Disp: 270 tablet, Rfl: 10   levothyroxine  (SYNTHROID ) 50 MCG tablet, TAKE 1 TABLET (50 MCG TOTAL) BY MOUTH DAILY BEFORE BREAKFAST (AM), Disp: 30 tablet, Rfl: 0   metoprolol  succinate (TOPROL -XL) 25 MG 24 hr tablet, Take 1 tablet (25 mg total) by mouth daily., Disp: 60 tablet, Rfl: 0   Omega-3 Fatty Acids (FISH OIL PO), Take 1 tablet by mouth daily., Disp: , Rfl:    pantoprazole  (PROTONIX ) 40 MG tablet, TAKE 1 TABLET (40 MG TOTAL) BY MOUTH DAILY(AM),  Disp: 30 tablet, Rfl: 0   pantoprazole  (PROTONIX ) 40 MG tablet, Take 1 tablet (40 mg  total) by mouth daily., Disp: 30 tablet, Rfl: 0   potassium chloride  SA (KLOR-CON  M) 20 MEQ tablet, Take 2 tablets (40 mEq total) by mouth 2 (two) times daily., Disp: 200 tablet, Rfl: 3   rosuvastatin  (CRESTOR ) 40 MG tablet, TAKE 1 TABLET BY MOUTH DAILY *NEW PRESCRIPTION REQUEST*, Disp: 90 tablet, Rfl: 10   tirzepatide  (MOUNJARO ) 7.5 MG/0.5ML Pen, INJECT 7.5 MG SUBCUTANEOUSLY ONCE WEEKLY *NEW PRESCRIPTION REQUEST*, Disp: 6 mL, Rfl: 0   torsemide  (DEMADEX ) 20 MG tablet, Take 4 tablets (80 mg total) by mouth 2 (two) times daily., Disp: 540 tablet, Rfl: 10   VITAMIN E PO, Take 1 tablet by mouth daily., Disp: , Rfl:    acetaminophen  (TYLENOL ) 500 MG tablet, Take 1,000 mg by mouth every 6 (six) hours as needed for headache (pain)., Disp: , Rfl:    albuterol  (VENTOLIN  HFA) 108 (90 Base) MCG/ACT inhaler, USE 2 PUFFS BY MOUTH EVERY FOUR HOURS, AS NEEDED, FOR COUGHING/WHEEZING, Disp: 8.5 g, Rfl: 0   Alcohol Swabs (ALCOHOL PREP) 70 % PADS, USE TO CLEAN SKIN *NEW PRESCRIPTION REQUEST*, Disp: 100 each, Rfl: 10   Continuous Glucose Receiver (DEXCOM G7 RECEIVER) DEVI, 1 Device by Does not apply route continuous. (Patient not taking: Reported on 11/18/2024), Disp: 1 each, Rfl: 0   Continuous Glucose Receiver (FREESTYLE LIBRE 3 READER) DEVI, 1 Device by Does not apply route continuous., Disp: 1 each, Rfl: 0   Continuous Glucose Sensor (DEXCOM G7 SENSOR) MISC, 1 Device by Does not apply route continuous. (Patient not taking: Reported on 11/18/2024), Disp: 9 each, Rfl: 3   docusate sodium  (COLACE) 100 MG capsule, Take 2 capsules (200 mg total) by mouth at bedtime., Disp: 10 capsule, Rfl: 0   folic acid  (FOLVITE ) 1 MG tablet, Take 1 tablet (1 mg total) by mouth daily., Disp: 30 tablet, Rfl: 0   Glucagon  (BAQSIMI  ONE PACK) 3 MG/DOSE POWD, Place 1 Device into the nose as needed (Low blood sugar with impaired consciousness)., Disp: 2 each,  Rfl: 3   Menthol -Camphor (ICY HOT PRO NO MESS EX), Apply 1 Application topically 3 (three) times daily as needed (arms, neuropathy lower extrimity, gout in hand.)., Disp: , Rfl:    NYAMYC  powder, Apply 1 Application topically 2 (two) times daily., Disp: , Rfl:    nystatin  cream (MYCOSTATIN ), Apply 1 Application topically 2 (two) times daily as needed for dry skin., Disp: , Rfl:    polyethylene glycol (MIRALAX  / GLYCOLAX ) 17 g packet, Take 17 g by mouth daily as needed for moderate constipation., Disp: 14 each, Rfl: 0 [2]  Allergies Allergen Reactions   Neurontin  [Gabapentin ] Nausea And Vomiting and Other (See Comments)    POSSIBLE SHAKING   Lyrica  [Pregabalin ] Other (See Comments)    Shaking

## 2024-11-19 ENCOUNTER — Telehealth: Payer: Self-pay

## 2024-11-19 NOTE — Telephone Encounter (Signed)
"  Lvm for pt to call back  "

## 2024-11-19 NOTE — Telephone Encounter (Signed)
-----   Message from [name], MD sent at 11/18/2024  3:34 PM EST ----- Please make sure by calling the patient that she understood the plan for today:  Will recommend the following: Lantus  52 units at bedtime Humalog  32 units tid 15 min before meals  (Skip if sugar is less than 70, cut the dose in half if sugar is between 71-100) Humalog  correction scale: Use in addition to your meal time/short acting insulin  based on blood sugars as follows: 151 - 180: 1 unit 181 - 210: 2 units 211 - 240: 3 units 241 - 270: 4 units 271 - 300: 5 units 301 - 330: 6 units 331 - 360: 7 units 361 - 390: 8 units 391 - 420: 9 units  Mounjaro  7.5 mg weekly (max dose tolerated)

## 2024-11-20 ENCOUNTER — Other Ambulatory Visit: Payer: Self-pay

## 2024-11-20 ENCOUNTER — Ambulatory Visit: Attending: Student

## 2024-11-20 DIAGNOSIS — R262 Difficulty in walking, not elsewhere classified: Secondary | ICD-10-CM | POA: Insufficient documentation

## 2024-11-20 DIAGNOSIS — M6281 Muscle weakness (generalized): Secondary | ICD-10-CM | POA: Insufficient documentation

## 2024-11-20 NOTE — Therapy (Signed)
 "  OUTPATIENT PHYSICAL THERAPY WHEELCHAIR EVALUATION   Patient Name: [name] MRN: [id number] DOB:Apr 10, 1961, 64 y.o., female Today's Date: 11/20/2024  END OF SESSION:  PT End of Session - 11/20/24 1009     Visit Number 1    Number of Visits 1    PT Start Time 1010    PT Stop Time 1100    PT Time Calculation (min) 50 min    Equipment Utilized During Treatment Gait belt    Activity Tolerance Patient tolerated treatment well    Behavior During Therapy WFL for tasks assessed/performed          Past Medical History:  Diagnosis Date   Acute renal failure    Acute respiratory failure with hypoxia ([name]) 12/02/2019   Alcohol abuse    Alcoholic cirrhosis ([name])    Anemia    Anxiety    Arthritis    knees (11/26/2018)   B12 deficiency    CAD (coronary artery disease)    a. 11/10/2014 Cath: LM nl, LAD min irregs, D1 30 ost, D2 50d, LCX 98m, OM1 80 p/m (1.5 mm vessel), OM2 36m, RCA nondom 57m-->med rx.. Demand ischemia in the setting of rapid a-fib.   Cardiomyopathy ([name])    Carotid artery disease    a. 01/2015 Carotid Angio: RICA 100, LICA 95p; b. 02/2015 s/p L CEA; c. 05/2019 Carotid U/S: RICA 100. RECA >50. LICA 1-39%.   Cellulitis    lower extremities   Cellulitis in diabetic foot ([name]) 07/08/2019   CHF (congestive heart failure) ([name])    Chronic combined systolic and diastolic CHF (congestive heart failure) ([name])    a. 10/2014 Echo: EF 40-45%; b. 10/2018 Echo: EF 45-50%, gr2 DD; c. 11/2019 Echo: EF 50%, mild LVH, gr2 DD (restrictive), antlat HK, Nl RV fxn. Mild BAE. RVSP 59.85mmHg.   CKD (chronic kidney disease), stage III ([name])    Cocaine abuse ([name])    COPD (chronic obstructive pulmonary disease) ([name])    Critical ischemia of foot ([name]) 06/07/2021   Critical limb ischemia     Critical lower limb ischemia ([name])    Demand ischemia ([name]) 10/29/2014   Depression    Diabetes mellitus without complication ([name])    Diabetic peripheral neuropathy ([name])    DVT (deep venous  thrombosis) ([name])    Dyspnea    Elevated troponin    a. Chronic elevation.   Fall 05/05/2021   Femoro-popliteal artery disease    Peripheral arterial disease/critical limb ischemia     GERD (gastroesophageal reflux disease)    Hyperlipemia    Hypertension    Hypokalemia    Hypomagnesemia    Hypothyroidism    Marijuana abuse    Narcotic abuse ([name])    Noncompliance    NSVT (nonsustained ventricular tachycardia) ([name])    Obesity    Osteomyelitis ([name]) 06/21/2019   PAF (paroxysmal atrial fibrillation) ([name])    Paroxysmal atrial tachycardia    Peripheral arterial disease    a. 01/2015 Angio/PTA: RSFA 99 (atherectomy/pta) - 1 vessel runoff via diff dzs peroneal; b. 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   Pneumonia    once or twice (11/26/2018)   Poorly controlled type 2 diabetes mellitus ([name])    Renal disorder    Renal insufficiency    a. 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   SIRS (systemic inflammatory response syndrome) ([name]) 04/06/2017   Sleep apnea    couldn't handle wearing the mask (11/26/2018)   Symptomatic bradycardia  a. Avoid AV blocking agent per EP. Prev req temp wire in 2017.   Tobacco abuse    Wrist fracture, left, closed, initial encounter 01/29/2015   Past Surgical History:  Procedure Laterality Date   ABDOMINAL AORTOGRAM N/A 06/26/2019   Procedure: ABDOMINAL AORTOGRAM;  Surgeon: Darron Deatrice LABOR, MD;  Location: [facility];  Service: Cardiovascular;  Laterality: N/A;   ABDOMINAL AORTOGRAM W/LOWER EXTREMITY N/A 06/07/2021   Procedure: ABDOMINAL AORTOGRAM W/LOWER EXTREMITY;  Surgeon: Court Dorn PARAS, MD;  Location: [facility];  Service: Cardiovascular;  Laterality: N/A;   ABDOMINAL AORTOGRAM W/LOWER EXTREMITY N/A 08/11/2023   Procedure: ABDOMINAL AORTOGRAM W/LOWER EXTREMITY;  Surgeon: Magda Debby SAILOR, MD;  Location: [facility];  Service: Cardiovascular;  Laterality: N/A;   AMPUTATION Right 06/14/2017   Procedure: Right foot  transmetatarsal amputation;  Surgeon: Harden Jerona GAILS, MD;  Location: [facility] OR;  Service: Orthopedics;  Laterality: Right;   AMPUTATION Left 06/28/2019   Procedure: AMPUTATION LEFT FIFTH TOE;  Surgeon: Oris Krystal FALCON, MD;  Location: [facility] OR;  Service: Vascular;  Laterality: Left;   AMPUTATION TOE Right 04/28/2017   Procedure: AMPUTATION OF RIGHT SECOND RAY;  Surgeon: Harden Jerona GAILS, MD;  Location: [facility] OR;  Service: Orthopedics;  Laterality: Right;   CARDIAC CATHETERIZATION     CARDIAC CATHETERIZATION N/A 01/12/2016   Procedure: Temporary Wire;  Surgeon: Lynwood Schilling, MD;  Location: [facility];  Service: Cardiovascular;  Laterality: N/A;   CARDIOVERSION  ~ 02/2013   twice    CARDIOVERSION N/A 09/26/2023   Procedure: CARDIOVERSION (CATH LAB);  Surgeon: Alvan Ronal BRAVO, MD;  Location: [facility] INVASIVE CV LAB;  Service: Cardiovascular;  Laterality: N/A;   CAROTID ANGIOGRAM N/A 01/15/2015   Procedure: CAROTID ANGIOGRAM;  Surgeon: Dorn PARAS Court, MD;  Location: [facility] CATH LAB;  Service: Cardiovascular;  Laterality: N/A;   COLONOSCOPY N/A 03/14/2024   Procedure: COLONOSCOPY;  Surgeon: Albertus Gordy HERO, MD;  Location: [facility] ENDOSCOPY;  Service: Gastroenterology;  Laterality: N/A;   DILATION AND CURETTAGE OF UTERUS  1988   ENDARTERECTOMY Left 02/19/2015   Procedure: LEFT CAROTID ENDARTERECTOMY ;  Surgeon: Gaile LELON New, MD;  Location: [facility] OR;  Service: Vascular;  Laterality: Left;   ESOPHAGOGASTRODUODENOSCOPY N/A 03/13/2024   Procedure: EGD (ESOPHAGOGASTRODUODENOSCOPY);  Surgeon: Albertus Gordy HERO, MD;  Location: [facility] ENDOSCOPY;  Service: Gastroenterology;  Laterality: N/A;   FEMORAL-TIBIAL BYPASS GRAFT Left 06/28/2019   Procedure: BYPASS GRAFT LEFT LEG FEMORAL TO ANTERIOR TIBIAL ARTERY using LEFT GREATER SAPHENOUS VEIN;  Surgeon: Oris Krystal FALCON, MD;  Location: [facility] OR;  Service: Vascular;  Laterality: Left;   LEFT HEART CATHETERIZATION WITH CORONARY ANGIOGRAM N/A 10/31/2014   Procedure: LEFT HEART CATHETERIZATION WITH CORONARY  ANGIOGRAM;  Surgeon: Lonni JONETTA Cash, MD;  Location: [facility] CATH LAB;  Service: Cardiovascular;  Laterality: N/A;   LOWER EXTREMITY ANGIOGRAM N/A 09/10/2013   Procedure: LOWER EXTREMITY ANGIOGRAM;  Surgeon: Dorn PARAS Court, MD;  Location: [facility] CATH LAB;  Service: Cardiovascular;  Laterality: N/A;   LOWER EXTREMITY ANGIOGRAM N/A 01/15/2015   Procedure: LOWER EXTREMITY ANGIOGRAM;  Surgeon: Dorn PARAS Court, MD;  Location: [facility] CATH LAB;  Service: Cardiovascular;  Laterality: N/A;   LOWER EXTREMITY ANGIOGRAPHY N/A 04/13/2017   Procedure: Lower Extremity Angiography;  Surgeon: Court Dorn PARAS, MD;  Location: [facility] INVASIVE CV LAB;  Service: Cardiovascular;  Laterality: N/A;   LOWER EXTREMITY ANGIOGRAPHY Left 06/26/2019   Procedure: LOWER EXTREMITY ANGIOGRAPHY;  Surgeon: Darron Deatrice LABOR, MD;  Location: [facility];  Service: Cardiovascular;  Laterality: Left;   PERIPHERAL VASCULAR ATHERECTOMY Right 06/07/2021   Procedure: PERIPHERAL VASCULAR ATHERECTOMY;  Surgeon: Court Dorn PARAS, MD;  Location: [facility] INVASIVE CV LAB;  Service: Cardiovascular;  Laterality: Right;   PERIPHERAL VASCULAR BALLOON ANGIOPLASTY Left 06/26/2019   Procedure: PERIPHERAL VASCULAR BALLOON ANGIOPLASTY;  Surgeon: Darron Deatrice LABOR, MD;  Location: [facility];  Service: Cardiovascular;  Laterality: Left;  unable to cross lt sfa occlusion   PERIPHERAL VASCULAR INTERVENTION  04/13/2017   Procedure: Peripheral Vascular Intervention;  Surgeon: Court Dorn PARAS, MD;  Location: [facility] INVASIVE CV LAB;  Service: Cardiovascular;;   PERIPHERAL VASCULAR INTERVENTION  08/11/2023   Procedure: PERIPHERAL VASCULAR INTERVENTION;  Surgeon: Magda Debby SAILOR, MD;  Location: [facility];  Service: Cardiovascular;;   PRESSURE SENSOR/CARDIOMEMS N/A 02/05/2020   Procedure: PRESSURE SENSOR/CARDIOMEMS;  Surgeon: Rolan Ezra RAMAN, MD;  Location: [facility] INVASIVE CV LAB;  Service: Cardiovascular;  Laterality: N/A;   VEIN HARVEST Left 06/28/2019    Procedure: LEFT LEG GREATER SAPHENOUS VEIN HARVEST;  Surgeon: Oris Krystal FALCON, MD;  Location: [facility] OR;  Service: Vascular;  Laterality: Left;   Patient Active Problem List   Diagnosis Date Noted   Precordial pain 10/05/2024   Elevated troponin 10/05/2024   Acute HFrEF (heart failure with reduced ejection fraction) ([name]) 10/05/2024   High risk medication use 10/05/2024   Long term current use of antiarrhythmic drug 10/05/2024   Chronic kidney disease, stage IV (severe) ([name]) 10/05/2024   Anemia 10/05/2024   Melanotic stools 10/05/2024   Cardiomyopathy ([name]) 10/05/2024   Acute on chronic congestive heart failure ([name]) 10/04/2024   Acute on chronic anemia 10/04/2024   COPD with acute exacerbation ([name]) 10/03/2024   Acute cystitis 10/03/2024   History of CEA (carotid endarterectomy) 10/03/2024   Gout 10/03/2024   Anemia of chronic renal failure 08/06/2024   Persistent atrial fibrillation ([name]) 03/15/2024   Benign neoplasm of ascending colon 03/14/2024   Benign neoplasm of transverse colon 03/14/2024   Benign neoplasm of descending colon 03/14/2024   Benign neoplasm of sigmoid colon 03/14/2024   Chronic systolic congestive heart failure ([name]) 03/12/2024   Melena 03/12/2024   Heme positive stool 03/11/2024   GI bleed 03/08/2024   History of CAD (coronary artery disease) 03/08/2024   CKD (chronic kidney disease), stage IV ([name]) 03/08/2024   Continuous dependence on cigarette smoking 03/08/2024   GAD (generalized anxiety disorder) 03/08/2024   HFrEF (heart failure with reduced ejection fraction) ([name]) 10/11/2023   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) and diastolic (congestive) heart failure ([name]) 10/10/2023   Pre-ulcerative calluses 04/25/2022   PAF (paroxysmal atrial fibrillation) ([name]) 05/05/2021   COPD (chronic obstructive pulmonary disease) ([name]) 05/05/2021   OSA (obstructive sleep apnea) 05/05/2021   Stage 3b chronic kidney disease ([name]) 05/05/2021   Pressure injury of  skin 05/04/2021   Diabetic nephropathy ([name]) 01/02/2021   Low back pain 06/01/2020   Hyperlipidemia 04/03/2020   Peripheral artery disease 12/26/2019   NICM (nonischemic cardiomyopathy) ([name]) 06/20/2019   Non-healing ulcer ([name]) 06/20/2019   Acute CHF (congestive heart failure) ([name]) 11/26/2018   Coagulation disorder 08/09/2017   Depression 07/21/2017   At risk for adverse drug reaction 06/20/2017   Peripheral neuropathy 06/20/2017   S/P transmetatarsal amputation of foot, right ([name]) 06/05/2017   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) 05/19/2017   Obesity, class 2 02/24/2016   Anticoagulation management encounter 02/10/2016   Chronic sinus bradycardia 01/12/2016   Essential  hypertension 12/22/2015   Demand ischemia ([name])    Acute on chronic diastolic CHF (congestive heart failure) ([name])    Symptomatic anemia 11/08/2015   Hypokalemia 11/08/2015   Tobacco abuse 10/23/2015   Coronary artery disease    DOE (dyspnea on exertion) 04/29/2015   Paroxysmal atrial fibrillation ([name]) 01/16/2015   Carotid artery stenosis 01/16/2015   Insomnia 02/03/2014   S/P peripheral artery angioplasty - [employer]bo[employer] atherectomy; R SFA 09/11/2013    Class: Acute   Leg pain, bilateral 08/19/2013   Hypothyroidism 07/31/2013   History of cocaine abuse ([name]) 06/13/2013   Long term current use of anticoagulant therapy 05/20/2013   Alcohol abuse    Narcotic abuse ([name])    Marijuana abuse    Alcoholic cirrhosis ([name])    Insulin  dependent type 2 diabetes mellitus ([name])     PCP: Campbell Reynolds, NP  REFERRING PROVIDER: Campbell Reynolds, NP  THERAPY DIAG:  Difficulty in walking, not elsewhere classified  Muscle weakness (generalized)  Rationale for Evaluation and Treatment Rehabilitation  SUBJECTIVE:                                                                                                                                                                                            SUBJECTIVE STATEMENT: Pt presents for wheelchair evaluation. PMH: GI bleed, CAD, HFrEF, CAD, CKD stage IV, carotid stenosis s/p endarterectomy, paroxysmal AFib, PAD, COPD, GERD, smoker, chronic bradycardia, prior substance abuse, gout, HTN, DM type II, HLD, and vitamin D  deficiency. Pt was recently admitted to hospital and was prescribed O2 to use as needed at home. Currently her O2 levels are WFL with sitting. Patient is currently using standard wheelchair with standard cushion. Patient was given that wheelchair when she was in hospital a year ago. Patient has significant pain in bil hands due to gout and arthritis that she is unable to propel her wheelchair. Pt lives with sister at home. Pt doesn't have any caregivers. Pt has all of her toes amputated on R foot, and has pinky toe amputated on L. Pt reports she had stage IV wounds on her R heel recently. Pt had stage I pressure ulcer on her sacrum <1 year ago. Pt reports she has severe DJD in R>L knee.  PRECAUTIONS: Fall  RED FLAGS: None  WEIGHT BEARING RESTRICTIONS No    OCCUPATION:Disabled.  PLOF:  Requires assistive device for independence, Needs assistance with ADLs, Needs assistance with homemaking, Needs assistance with gait, and Needs assistance with transfers  PATIENT GOALS: obtain a power chair         MEDICAL HISTORY:  Primary diagnosis onset: 10/30/2024     Medical  Diagnosis with ICD-10 code: R26.89 (ICD-10-CM) - Imbalance  Z89.429 (ICD-10-CM) - Acquired absence of other toe(s), unspecified side  Z89.431 (ICD-10-CM) - Acquired absence of right foot  I50.42 (ICD-10-CM) - Chronic combined systolic (congestive) and diastolic (congestive) heart failure   [x] Progressive disease  Relevant future surgeries:     Height: 5' 3 Weight: 217 lbs Explain recent changes or trends in weight:      History:  Past Medical History:  Diagnosis Date   Acute renal failure    Acute respiratory failure  with hypoxia ([name]) 12/02/2019   Alcohol abuse    Alcoholic cirrhosis ([name])    Anemia    Anxiety    Arthritis    knees (11/26/2018)   B12 deficiency    CAD (coronary artery disease)    a. 11/10/2014 Cath: LM nl, LAD min irregs, D1 30 ost, D2 50d, LCX 91m, OM1 80 p/m (1.5 mm vessel), OM2 81m, RCA nondom 64m-->med rx.. Demand ischemia in the setting of rapid a-fib.   Cardiomyopathy ([name])    Carotid artery disease    a. 01/2015 Carotid Angio: RICA 100, LICA 95p; b. 02/2015 s/p L CEA; c. 05/2019 Carotid U/S: RICA 100. RECA >50. LICA 1-39%.   Cellulitis    lower extremities   Cellulitis in diabetic foot ([name]) 07/08/2019   CHF (congestive heart failure) ([name])    Chronic combined systolic and diastolic CHF (congestive heart failure) ([name])    a. 10/2014 Echo: EF 40-45%; b. 10/2018 Echo: EF 45-50%, gr2 DD; c. 11/2019 Echo: EF 50%, mild LVH, gr2 DD (restrictive), antlat HK, Nl RV fxn. Mild BAE. RVSP 59.18mmHg.   CKD (chronic kidney disease), stage III ([name])    Cocaine abuse ([name])    COPD (chronic obstructive pulmonary disease) ([name])    Critical ischemia of foot ([name]) 06/07/2021   Critical limb ischemia     Critical lower limb ischemia ([name])    Demand ischemia ([name]) 10/29/2014   Depression    Diabetes mellitus without complication ([name])    Diabetic peripheral neuropathy ([name])    DVT (deep venous thrombosis) ([name])    Dyspnea    Elevated troponin    a. Chronic elevation.   Fall 05/05/2021   Femoro-popliteal artery disease    Peripheral arterial disease/critical limb ischemia     GERD (gastroesophageal reflux disease)    Hyperlipemia    Hypertension    Hypokalemia    Hypomagnesemia    Hypothyroidism    Marijuana abuse    Narcotic abuse ([name])    Noncompliance    NSVT (nonsustained ventricular tachycardia) ([name])    Obesity    Osteomyelitis ([name]) 06/21/2019   PAF (paroxysmal atrial fibrillation) ([name])    Paroxysmal atrial tachycardia    Peripheral arterial disease    a. 01/2015 Angio/PTA:  RSFA 99 (atherectomy/pta) - 1 vessel runoff via diff dzs peroneal; b. 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   Pneumonia    once or twice (11/26/2018)   Poorly controlled type 2 diabetes mellitus ([name])    Renal disorder    Renal insufficiency    a. 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   SIRS (systemic inflammatory response syndrome) ([name]) 04/06/2017   Sleep apnea    couldn't handle wearing the mask (11/26/2018)   Symptomatic bradycardia    a. Avoid AV blocking agent per EP. Prev req temp wire in 2017.   Tobacco abuse    Wrist fracture, left, closed, initial encounter 01/29/2015  Cardio Status:  Functional Limitations: decreased endurance, SOB  [] Intact  [x]  Impaired    CHF  Respiratory Status:  Functional Limitations:   [] Intact  [x] Impaired   [x] SOB [] COPD [] O2 Dependent ______LPM  [] Ventilator Dependent  Resp equip:                                                     Objective Measure(s):   Orthotics:   [] Amputee:                                                             [] Prosthesis:        HOME ENVIRONMENT:  [] House [] Condo/town home [x] Apartment [] Asst living [] LTCF         [] Own  [x] Rent   [] Lives alone [x] Lives with others -      sister                       Hours without assistance: 8-10 hours  [] Home is accessible to patient                                 Storage of wheelchair:  [x] In home   [] Other Comments:        COMMUNITY :  TRANSPORTATION:  [] Car [] Pensions Consultant [] Adapted w/c Lift []  Ambulance [] Other:                     [] Sits in wheelchair during transport   Where is w/c stored during transport?  [] [place] [place]  []  [employer]  r   [] Self-Driver       Drive while in  Biomedical Scientist [] yes [x] no   Employment and/or school:  Specific requirements pertaining to mobility        Other:  COMMUNICATION:  Verbal Communication  [x] WFL [] receptive [] WFL [] expressive [] Understandable  [] Difficult to understand  [] non-communicative  Primary  Language:___English___________ 2nd:_____________  Communication provided by:[x] Patient [] Family [] Caregiver [] Translator   [] Uses an augmentative communication device     Manufacturer/Model :                                                                MOBILITY/BALANCE:  Sitting Balance  Standing Balance  Transfers  Ambulation   [x] WFL      [] WFL  [] Independent  []  Independent   [] Uses UE for balance in sitting Comments:  [x] Uses UE/device for stability Comments:  [x]  Min assist  []  Ambulates independently with       device:_ _______________      []  Mod assist  [x]  Able to ambulate __10____ feet        safely/functionally/independently   []  Min assist  []  Min assist  []  Max assist  []  Non-functional ambulator         History/High risk of falls   []  Mod assist  []  Mod  assist  []  Dependent  []  Unable to ambulate   []  Max  assist  []  Max assist  Transfer method:[] 1 person [] 2 person [] sliding board [] squat pivot [] stand pivot [] mechanical patient lift  [] other:   []  Unable  []  Unable    Fall History: # of falls in the past 6 months? 2 # of near falls in the past 6 months? 5    CURRENT SEATING / MOBILITY:  Current Mobility Device: [] None [] Cane/Walker [x] Manual [] Dependent [] Dependent w/ Tilt rScooter  [] Power (type of control):   Manufacturer:  Model:  Serial #:   Size:  Color:  Age:   Purchased by whom:   Current condition of mobility base:    Current seating system:                                                                       Age of seating system:    Describe posture in present seating system:    Is the current mobility meeting medical necessity?:  [] Yes [x] No Describe: Pt is unable to propel manual wheelchair functionally. She is able to propel for about 10-15 feet before needing to take break due to SOB. Pt unable to use her bil UE due to pain in bil hands. Pt has bil peripheral neuropathy which causes pain and prevents patient from propelling manual wheelchair with LE                                     Ability to complete Mobility-Related Activities of Daily Living (MRADL's) with Current Mobility Device:   Move room to room  [] Independent  [x] Min [] Mod [] Max assist  [] Unable  Comments: uses shower chair for bathing, needs assistance with LE dressing, specially socks  Meal prep  [] Independent  [] Min [] Mod [] Max assist  [x] Unable    Feeding  [x] Independent  [] Min [] Mod [] Max assist  [] Unable    Bathing  [] Independent  [] Min [x] Mod [] Max assist  [] Unable    Grooming  [x] Independent  [] Min [] Mod [] Max assist  [] Unable    UE dressing  [x] Independent  [] Min [] Mod [] Max assist  [] Unable    LE dressing  [] Independent   [x] Min [] Mod [] Max assist  [] Unable    Toileting  [] Independent  [x] Min [] Mod [] Max assist  [] Unable    Bowel Mgt: []  Continent []  Incontinent [x]  Accidents []  Diapers []  Colostomy []  Bowel Program:  Bladder Mgt: []  Continent []  Incontinent [x]  Accidents []  Diapers []  Urinal []  Intermittent Cath []  Indwelling Cath []  Supra-pubic Cath     Current Mobility Equipment Trialed/ Ruled Out:    Does not meet mobility needs due to:    Oneil all boxes that indicate inability to use the specific equipment listed     Meets needs for safe  independent functional  ambulation  / mobility    Risk of  Falling or History of Falls    Enviromental limitations      Cognition    Safety concerns with  physical ability    Decreased / limitations endurance  & strength     Decreased / limitations  motor skills  & coordination    Pain    Pace /  Speed    Cardiac and/or  respiratory condition    Contra - indicated by diagnosis   Cane/Crutches  []   []   []   []   []   []   []   []   []   []   [x]    Walker / Rollator  []  NA   []   [x]   [x]   []   [x]   [x]   [x]   [x]   [x]   [x]   []     Manual Wheelchair [id number]:  []  NA  []   [x]   [x]   []   [x]   [x]   [x]   [x]   [x]   [x]   []    Manual W/C (K0005) with power assist  []  NA  []   []   []   []   []   []   []   []   []   []   []     Scooter  [x]  NA  []   []   []   []   []   []   []   []   []   []   []    Power Wheelchair: standard joystick  []  NA  [x]   []   []   []   []   []   []   []   []   []   []    Power Wheelchair: alternative controls  []  NA  []   []   []   []   []   []   []   []   []   []   []    Summary:  The least costly alternative for independent functional mobility was found to be:    []  Crutch/Cane  []  Walker []  Manual w/c  []  Manual w/c with power assist   []  Scooter   [x]  Power w/c std joystick   []  Power w/c alternative control        []  Requires dependent care mobility device   Cabin Crew for [employer] skills are adequate for safe mobility equipment operation  [x]   Yes []   No  Patient is willing and motivated to use recommended mobility equipment  [x]   Yes []   No       []  Patient is unable to safely operate mobility equipment independently and requires dependent care equipment Comments:           SENSATION and SKIN ISSUES:  Sensation [x]  Intact  []  Impaired []  Absent []  Hyposensate []  Hypersensate  []  Defensiveness  Location(s) of impairment:    Pressure Relief Method(s):  []  Lean side to side to offload (without risk of falling)  []   W/C push up (4+ times/hour for 15+ seconds) [x]  Stand up (without risk of falling)    []  Other: (Describe): Effective pressure relief method(s) above can be performed consistently throughout the day: [] Yes  []  No If not, Why?:  Skin Integrity Risk:       []  Low risk           [x]  Moderate risk            []  High risk  If high risk, explain:   Skin Issues/Skin Integrity  Current skin Issues  []  Yes [x]  No [x]  Intact  []   Red area   []   Open area  []  Scar tissue  []  At risk from prolonged sitting  Where: History of Skin Issues  [x]  Yes []  No Where : R heel (stage IV); sacrum (Stage 1) When: Heel (2 months ago); Sacrum (<1 year ago) Stage: see above Hx of skin flap surgeries  []  Yes [x]  No Where:  When:  Pain: [x]  Yes []  No   Pain Location(s): bil  LE, R knee, bil hands Intensity scale: (  0-10) :8-10/10 How does pain interfere with mobility and/or MRADLs? - difficulty with propulsion of wheelchair, difficulty with standing/walking, hx of falls and near falls, difficulty with self care, transfers        MAT EVALUATION:  Neuro-Muscular Status: (Tone, Reflexive, Responses, etc.)     [x]   Intact   []  Spasticity:  []  Hypotonicity  []  Fluctuating  []  Muscle Spasms  []  Poor Righting Reactions/Poor Equilibrium Reactions  []  Primal Reflex(s):    Comments:            COMMENTS:    POSTURE:     Comments:  Pelvis Anterior/Posterior:  [x]  Neutral   []  Posterior  []  Anterior  []  Fixed - No movement []  Tendency away from neutral []  Flexible []  Self-correction []  External correction Obliquity (viewed from front)  [x]  WFL []  R Obliquity []  L Obliquity  []  Fixed - No movement []  Tendency away from neutral []  Flexible []  Self-correction []  External correction Rotation  [x]  WFL []  R anterior []  L anterior  []  Fixed - No movement []  Tendency away from neutral []  Flexible []  Self-correction []  External correction Tonal Influence Pelvis:  [x]  Normal []  Flaccid []  Low tone []  Spasticity []  Dystonia []  Pelvis thrust []  Other:    Trunk Anterior/Posterior:  [x]  WFL []  Thoracic kyphosis []  Lumbar lordosis  []  Fixed - No movement []  Tendency away from neutral []  Flexible []  Self-correction []  External correction  [x]  WFL []  Convex to left  []  Convex to right []  S-curve   []  C-curve []  Multiple curves []  Tendency away from neutral []  Flexible []  Self-correction []  External correction Rotation of shoulders and upper trunk:  [x]  Neutral []  Left-anterior []  Right- anterior []  Fixed- no movement []  Tendency away from neutral []  Flexible []  Self correction []  External correction Tonal influence Trunk:  [x]  Normal []  Flaccid []  Low tone []  Spasticity []  Dystonia []  Other:   Head & Neck  [x]   Functional []  Flexed    []  Extended []  Rotated right  []  Rotated left []  Laterally flexed right []  Laterally flexed left []  Cervical hyperextension   [x]  Good head control []  Adequate head control []  Limited head control []  Absent head control Describe tone/movement of head and neck:      Lower Extremity Measurements: LE ROM:  Active ROM Right 11/20/2024 Left 11/20/2024  Hip flexion    Hip extension    Hip abduction    Hip adduction    Knee flexion    Knee extension    Ankle dorsiflexion    Ankle plantarflexion     (Blank rows = not tested)  LE MMT:  MMT Right 11/20/2024 Left 11/20/2024  Hip flexion 4/5 4/5  Hip extension    Hip abduction 4/5 4/5  Hip adduction 4/5 4/5  Knee flexion    Knee extension    Ankle dorsiflexion    Ankle plantarflexion     (Blank rows = not tested)  Hip positions:  [x]  Neutral   []  Abducted   []  Adducted  []  Subluxed   []  Dislocated   []  Fixed   []  Tendency away from neutral []  Flexible []  Self-correction []  External correction   Hip Windswept:[x]  Neutral  []  Right    []  Left  []  Subluxed   []  Dislocated   []  Fixed   []  Tendency away from neutral []  Flexible []  Self-correction []  External correction  LE Tone: [x]  Normal []  Low tone []  Spasticity []  Flaccid []  Dystonia []  Rocks/Extends at hip []   Thrust into knee extension []  Pushes legs downward into footrest  Foot positioning: ROM Concerns: Dorsiflexed: []  Right   []  Left Plantar flexed: []  Right    []  Left Inversion: []  Right    []  Left Eversion: []  Right    []  Left  LE Edema: []  1+ (Barely detectable impression when finger is pressed into skin) [x]  2+ (slight indentation. 15 seconds to rebound) []  3+ (deeper indentation. 30 seconds to rebound) []  4+ (>30 seconds to rebound)  UE Measurements:  UPPER EXTREMITY ROM:   Active ROM Right 11/20/2024 Left 11/20/2024  Shoulder flexion    Shoulder abduction    Shoulder adduction    Elbow flexion    Elbow extension     Wrist flexion    Wrist extension    (Blank rows = not tested)  UPPER EXTREMITY MMT:  MMT Right 11/20/2024 Left 11/20/2024  Shoulder flexion 4/5 4/5  Shoulder abduction    Shoulder adduction    Elbow flexion    Elbow extension    Wrist flexion    Wrist extension    Pinch strength    Grip strength 23 lbs 22 lbs  (Blank rows = not tested)  Shoulder Posture:  Right Tendency towards Left  []   Functional []    []   Elevation []    []   Depression []    []   Protraction []    []   Retraction []    [x]   Internal rotation [x]    []   External rotation []    []   Subluxed []     UE Tone: [x]  Normal []  Flaccid []  Low tone []  Spasticity  []  Dystonia []  Other:   UE Edema: [x]  1+ (Barely detectable impression when finger is pressed into skin) []  2+ (slight indentation. 15 seconds to rebound) []  3+ (deeper indentation. 30 seconds to rebound) []  4+ (>30 seconds to rebound)  Wrist/Hand: Handedness: []  Right   [x]  Left   []  NA: Comments:  Right  Left  []   WNL []    []   Limitations []    []   Contractures []    []   Fisting []    []   Tremors []    [x]   Weak grasp [x]    []   Poor dexterity []    []   Hand movement non functional []    []   Paralysis []         MOBILITY BASE RECOMMENDATIONS and JUSTIFICATION:  MOBILITY BASE  JUSTIFICATION   Manufacturer:   Quantum Model:           [id number]                   Color:  Seat Width:  18 Seat Depth: 18   []  Manual mobility base (continue below)   []  Scooter/POV  [x]  Power mobility base   Number of hours per day spent in above selected mobility base: 12+  Typical daily mobility base use Schedule:    [x]  is not a safe, functional ambulator  [x]  limitation prevents from completing a MRADL(s) within a reasonable time frame    [x]  limitation places at high risk of morbidity or mortality secondary to  the attempts to perform a    MRADL(s)  []  limitation prevents accomplishing a MRADL(s) entirely  [x]  provide independent mobility  [x]  equipment is a  lifetime medical need  [x]  walker or cane inadequate  [x]  any type manual wheelchair      inadequate  [x]  scooter/POV inadequate      []  requires dependent mobility  MANUAL MOBILITY      []  Standard manual wheelchair  K0001      Arm:    []  both []  right  []  left      Foot:   []  both []  right   []  left  []  self-propels wheelchair  []  will use on regular basis  []  chair fits throughout home  []  willing and motivated to use  []  propels with assistance     []  dependent use   []  Standard hemi-manual wheelchair  K0002      Arm:    []  both []  right  []  left      Foot:   []  both []  right   []  left  []  lower seat height required to foot propel  []  short stature  []  self-propels wheelchair  []  will use on regular basis  []  chair fits throughout home  []  willing and motivated to use   []  propels with assistance  []  dependent use   []  Lightweight manual wheelchair  K0003      Arm:    []  both []  right  []  left      Foot:   []  both  []  right  []  left                   []  hemi height required  []  medical condition and weight of  wheelchair affect ability to self      propel standard manual wheelchair in the residence  []  can and does self-propel (marginal propulsion skills)  []  daily use _________hours  []  chair fits throughout home  []  willing and motivated to use  []  lower seat height required to foot propel  []  short stature   []  High strength lightweight manual  wheelchair (Breezy Ultra 4)  K0004     Arm:    []  both []  right  []  left     Foot:   []  both []  right   []  left                                                                  []  hemi height required []  medical condition and weight of wheelchair affect ability to self propel while engaging in frequent MRADL(s) that cannot be performed in a standard or lightweight manual wheelchair  []  daily use _________hours  []  chair fits throughout home  []  willing and motivated to use  []  prevent repetitive use injuries    []  lower seat height required to foot propel  []  short stature    []  Ultra-lightweight manual wheelchair  K0005     Arm:    []  both []  right  []  left     Foot:   []  both []  right  []  left       []  hemi height required  []  heavy duty    Front seat to floor _____ inches      Rear seat to floor _____ inches      Back height _____ inches     Back angle ______ degrees      Front angle _____ degrees  []   full-time manual wheelchair user  []  Requires individualized fitting and optimal adjustments for multiple features that include adjustable axle configuration, fully adjustable center of  gravity, wheel camber, seat and back angle, angle of seat slope, which cannot be accommodated by a K0001 through K0004 manual wheelchair  []  prevent repetitive use injuries  []  daily use_________hours   []  user has high activity patterns that frequently require  them  to go out into the community for the purpose of independently accomplishing high level MRADL activities. Examples of these might include a combination of; shopping, work, school, photographer, childcare, independently loading and unloading from a vehicle etc.  []  lower seat height required to foot propel  []  short stature  []  heavy duty -  weight over 250lbs   []  Current chair is a K0005   manufacture:___________________  model:_________________  serial#____________________  age:_________    []  First time X9994 user (complete trial)  K0004 time and # of strokes to propel 30 feet: ________seconds _________strokes  X9994 time and # of strokes to propel 30 feet: ________seconds _________strokes  What was the result of the trial between the K0004 and K0005 manual wheelchair? ___    What features of the K0005 w/c are needed as compared to the K0004 base? Why?___    []  adjustable seat and back angle changes the angle of seat slope of the frame to attain a gravity assisted position for efficient propulsion and proper weight distribution  along the frame     []  the front of the wheelchair will be configured higher than the back of the chair to allow gravity to assist the user with postural stability  []  the center of the wheel will be positioned for stability, safety and efficient propulsion  []  adjustable axle allows for vertical, horizontal, camber and overall width changes  throughout the wheels for adjustment of the client's exact needs and abilities.   []  adjustable axle increases the stability and function of the chair allowing for adjustment of the center of gravity.   []  accommodates the client's anatomical position in the chair maximizing independence in mobility and maneuverability in all environments.   []  create a minimal fixed tilt-in space to assist in positioning.   []  Describe users full-time manual wheelchair activity patterns:___    []  Power assist Comments:  []  prevent repetitive use injuries  []  repetitive strain injury present in    shoulder girdle    []  shoulder pain is (> or =) to 7/10     during manual propulsion       Current Pain _____/10  []  requires conservation of energy to participate in MRADL(s) runable to propel up ramps or curbs using manual wheelchair  []  been K0005 user greater than one year  []  user unwilling to use power      wheelchair (reason): []  less expensive option to power   wheelchair   []  rim activated power assist -      decreased strength   []  Heavy duty manual wheelchair       K0006     Arm:    []  both []  right  []  left     Foot:   []  both []  right  []  left     []  hemi height required    []  Dependent base  []  user exceeds 250lbs  []  non-functional ambulator    []  extreme spasticity  []  over active movement   []  broken frame/hx of repeated     repairs  []  able to self-propel in residence       []  lower seat to floor height required  []  unable to self-propel in residence   []   Extra heavy duty manual wheelchair  K0007     Arm:    []  both []  right  []  left     Foot:   []   both []  right  []  left     []  hemi height required  []  Dependent base  []  user exceeds 300lbs  []  non-functional ambulator    []  able to self-propel in residence   []  lower seat to floor height required  []  unable to self-propel in residence     []  Manual wheelchair with tilt [phone]      (Manual Tilt-n-Space)  []  patient is dependent for transfers  []  patient requires frequent       positioning for pressure relief   []  patient requires frequent      positioning for poor/absent trunk control        []  Stroller Base  []  infant/child   []  unable to propel manual      wheelchair  []  allows for growth  []  non-functional ambulator  []  non-functional UE  []  independent mobility is not a goal at this time    MANUAL FRAME OPTIONS      Push handles  []  extended   []  angle adjustable   []  standard  []  caregiver access  []  caregiver assist    []  allows hooking to enable      increased ability to perform ADLs or maintain balance   []  Angle Adjustable Back  []  postural control  []  control of tone/spasticity  []  accommodation of range of motion  []  UE functional control  []  accommodation for seating system    Rear wheel placement  []  std/fixed  [] fully adjustableramputee   []  camber ________degree  []  removable rear wheel  []  non-removable rear wheel  Wheel size _______  Wheel style_______________________  []  improved UE access to wheels  []  increase propulsion ability  []  improved stability  []  changing angle in space for      improvement of postural stability  []  remove for transport    []  allow for seating system to fit on  base  []  amputee placement  []  1-arm drive access   r R  r L  []  enable propulsion of manual       wheelchair with one arm    []  amputee placement   Wheel rims/ Hand rims  []  Standard    []  Specialized-____ []  provide ability to propel manual   []  increase self-propulsion with hand wheelchair weakness/decreased grasp     []  Spoke protector/guard   []   prevent hands from getting caught in spokes   Tires:  []  pneumatic  []  flat free inserts  []  solid  Style:  []  decrease roll resistance              []  prevent frequent flats  []  increase shock absorbency  []  decrease maintenance   []  decrease pain from road shock    []  decrease spasms from road shock    Wheel Locks:    []  push []  pull []  scissor  []  lock wheels for transfers  []  lock wheels from rolling   Brake/wheel lock extension:  []  R  []  L  []  allow user to operate wheel locks due to decreased reach or strength   Caster housing:  Caster size:                      Style:                                          []   suspension fork  []  maneuverability   []  stability of wheelchair   []  durability  []  maintenance  []  angle adjustment for posture  []  allow for feet to come under        wheelchair base  []  allows change in seat to floor  height   []  increase shock absorbency  []  decrease pain from road shock  []  decrease spasms from road    shock   []  Side guards  []  prevent clothing getting caught in wheel or becoming soiled   [] provide hip and pelvic stability  []  eliminates contact between body and wheels  []  limit hand contact with wheels   []  Anti-tippers      []  prevent wheelchair from tipping    backward  []  assist caregiver with curbs     POWER MOBILITY      []  Scooter/POV    []  can safely operate   []  can safely transfer   []  has adequate trunk stability   []  cannot functionally propel  manual wheelchair    [x]  Power mobility base    []  non-ambulatory   [x]  cannot functionally propel manual wheelchair   [x]  cannot functionally and safely      operate scooter/POV  [x]  can safely operate power       wheelchair  [x]  home is accessible  [x]  willing to use power wheelchair     Tilt  [x]  Powered tilt on powered chair  []  Powered tilt on manual chair  []  Manual tilt on manual chair Comments:  []  change position for pressure      []  Relief/cannot weight shift    [x]  change position against      gravitational force on head and      shoulders   [x]  decrease pain  [x]  blood pressure management   []  control autonomic dysreflexia  [x]  decrease respiratory distress  []  management of spasticity  []  management of low tone  [x]  facilitate postural control   [x]  rest periods   [x]  control edema  [x]  increase sitting tolerance   []  aid with transfers     Recline   []  Power recline on power chair  []  Manual recline on manual chair  Comments:    []  intermittent catheterization  []  manage spasticity  []  accommodate femur to back angle  []  change position for pressure relief/cannot weight shift rhigh risk of pressure sore development  []  tilt alone does not accomplish     effective pressure relief, maximum pressure relief achieved at -      _______ degrees tilt   _______ degrees recline   []  difficult to transfer to and from bed []  rest periods and sleeping in chair  []  repositioning for transfers  []  bring to full recline for ADL care  []  clothing/diaper changes in chair  []  gravity PEG tube feeding  []  head positioning  []  decrease pain  []  blood pressure management   []  control autonomic dysreflexia  []  decrease respiratory distress  []  user on ventilator     Elevator on mobility base  []  Power wheelchair  []  Scooter  []  increase Indep in transfers   []  increase Indep in ADLs    []  bathroom function and safety  []  kitchen/cooking function and safety  []  shopping  []  raise height for communication at standing level  []  raise height for eye contact which reduces cervical neck strain and pain  []  drive at raised height for safety and navigating crowds  []   Other:   []  Vertical position system  (anterior tilt)     (Drive locks-out)    []  Stand       (Drive enabled)  []  independent weight bearing  []  decrease joint contractures  []  decrease/manage spasticity  []  decrease/manage spasms  []  pressure distribution away from   scapula,  sacrum, coccyx, and ischial tuberosity  []  increase digestion and elimination   []  access to counters and cabinets  []  increase reach  []  increase interaction with others at eye level, reduces neck strain  []  increase performance of       MRADL(s)      Power elevating legrest    []  Center mount (Single) 85-170 degrees       []  Standard (Pair) 100-170 degrees  []  position legs at 90 degrees, not available with std power ELR  []  center mount tucks into chair to decrease turning radius in home, not available with std power ELR  []  provide change in position for LE  []  elevate legs during recline    []  maintain placement of feet on      footplate  []  decrease edema  []  improve circulation  []  actuator needed to elevate legrest  []  actuator needed to articulate legrest preventing knees from flexing  []  Increase ground clearance over      curbs  []   STD (pair) independently                     elevate legrest   POWER WHEELCHAIR CONTROLS      Controls/input device  []  Expandable  [x]  Non-expandable  []  Proportional  []  Right Hand [x]  Left Hand - Tilt through toggle []  Non-proportional/switches/head-array  []  Electrical/proximity         []   Mechanical      Manufacturer:___________________   Type:________________________ [x]  provides access for controlling wheelchair  [x]  programming for accurate control  [x]  progressive disease/changing condition  []  required for alternative drive      controls       [x]  lacks motor control to operate  proportional drive control  [x]  unable to understand proportional controls  []  limited movement/strength  []  extraneous movement / tremors / ataxic / spastic       []  Upgraded electronics controller/harness    []  Single power (tilt or recline)   []  Expandable    []  Non-expandable plus   []  Multi-power (tilt, recline, power legrest, power seat lift, vertical positioning system, stand)  []  allows input device to communicate with drive motors  []   harness provides necessary connections between the controller, input device, and seat functions     []  needed in order to operate power seat functions through joystick/ input device  []  required for alternative drive controls     []  Enhanced display  []  required to connect all alternative drive controls   []  required for upgraded joystick      (lite-throw, heavy duty, micro)  []  Allows user to see in which mode and drive the wheelchair is set; necessary for alternate controls       []  Upgraded tracking electronics  []  correct tracking when on uneven surfaces makes switch driving more efficient and less fatiguing  []  increase safety when driving  []  increase ability to traverse thresholds    []  Safety / reset / mode switches     Type:    []  Used to change modes and stop the wheelchair when driving     [x]  [facility]  for joystick / input device/switches  [x]  swing away for access or transfers   [x]  attaches joystick / input device / switches to wheelchair   [x]  provides for consistent access  [x]  midline for optimal placement    []  Attendant controlled joystick plus     mount  []  safety  []  long distance driving  []  operation of seat functions  []  compliance with transportation regulations    [x]  Battery Nf22 x 2 [x]  required to power (power assist / scooter/ power wc / other):   []  Power inverter (24V to 12V)  []  required for ventilator / respiratory equipment / other:     CHAIR OPTIONS MANUAL & POWER      Armrests   [x]  adjustable height []  removable  []  swing away []  fixed  [x]  flip back  []  reclining  [x]  full length pads []  desk []  tube arms []  gel pads  [x]  provide support with elbow at 90    []  remove/flip back/swing away for  transfers  []  provide support and positioning of upper body    []  allow to come closer to table top  []  remove for access to tables  []  provide support for w/c tray  []  change of height/angles for variable activities   []  Elbow support / Elbow stop  []  keep  elbow positioned on arm pad  []  keep arms from falling off arm pad  during tilt and/or recline   Upper Extremity Support  []  Arm trough  []   R  []   L  Style:  []  swivel mount []  fixed mount   []  posterior hand support  []   tray  []  full tray  []  joystick cut out  []   R  []   L  Style:  []  decrease gravitational pull on      shoulders  []  provide support to increase UE  function  []  provide hand support in natural    position  []  position flaccid UE  []  decrease subluxation    []  decrease edema       []  manage spasticity   []  provide midline positioning  []  provide work surface  []  placement for AAC/ Computer/ EADL       Hangers/ Legrests   []  ______ degree  []  Elevating []  articulating  []  swing away []  fixed []  lift off  []  heavy duty  []  adjustable knee angle  []  adjustable calf panel   []  longer extension tube              [x]  provide LE support  []  maintain placement of feet on      footplate   []  accommodate lower leg length  []  accommodate to hamstring       tightness  []  enable transfers  []  provide change in position for LE's  []  elevate legs during recline    []  decrease edema  []  durability      Foot support   [x]  footplate []  R []  L []  flip up           []  Depth adjustable   []  angle adjustable  []  foot board/one piece    [x]  provide foot support  []  accommodate to ankle ROM  []  allow foot to go under wheelchair base  []  enable transfers     []  Shoe holders  []  position foot    []  decrease / manage spasticity  []  control position of LE  []  stability    []  safety     []   Ankle strap/heel      loops  []  support foot on foot support  []  decrease extraneous movement  []  provide input to heel   []  protect foot     []  Amputee adapter []  R  []  L     Style:                  Size:  []  Provide support for stump/residual extremity    []  Transportation tie-down  []  to provide crash tested tie-down brackets    []  Crutch/cane holder    []  O2 holder    []  IV  hanger   []  Ventilator tray/mount    []  stabilize accessory on wheelchair       Component  Justification     [x]  Seat cushion   Skin protection and positioning   []  accommodate impaired sensation  [x]  decubitus ulcers present or history  []  unable to shift weight  [x]  increase pressure distribution  []  prevent pelvic extension  []  custom required off-the-shelf    seat cushion will not accommodate deformity  [x]  stabilize/promote pelvis alignment  [x]  stabilize/promote femur alignment  []  accommodate obliquity  []  accommodate multiple deformity  [x]  incontinent/accidents  [x]  low maintenance     []  seat mounts                 []  fixed []  removable  []  attach seat platform/cushion to wheelchair frame    []  Seat wedge    []  provide increased aggressiveness of seat shape to decrease sliding  down in the seat  []  accommodate ROM        []  Cover replacement   []  protect back or seat cushion  []  incontinent/accidents    []  Solid seat / insert    []  support cushion to prevent      hammocking  []  allows attachment of cushion to mobility base    []  Lateral pelvic/thigh/hip     support (Guides)     []  decrease abduction  []  accommodate pelvis  []  position upper legs  []  accommodate spasticity  []  removable for transfers     []  Lateral pelvic/thigh      supports mounts  []  fixed   []  swing-away   []  removable  []  mounts lateral pelvic/thigh supports     []  mounts lateral pelvic/thigh supports swing-away or removable for transfers    []  Medial thigh support ([place])  [] decrease adduction  [] accommodate ROM  []  remove for transfers   []  alignment      []  Medial thigh   []  fixed      support mounts      []  swing-away   []  removable  []  mounts medial thigh supports   []  Mounts medial supports swing- away or removable for transfers       Component  Justification   [x]  Back  - sport back     [x]  provide posterior trunk support []  facilitate tone  [x]  provide lumbar/sacral support []   accommodate deformity  [x]  support trunk in midline   []  custom required off-the-shelf back support will not accommodate deformity   [x]  provide lateral trunk support []  accommodate or decrease tone            []  Back mounts  []  fixed  []  removable  []  attach back rest/cushion to wheelchair frame   []  Lateral trunk      supports  []  R []  L  []  decrease lateral trunk leaning  []  accommodate asymmetry    []   contour for increased contact  []  safety    []  control of tone    []  Lateral trunk      supports mounts  []  fixed  []  swing-away   []  removable  []  mounts lateral trunk supports     []  Mounts lateral trunk supports swing-away or removable for transfers   []  Anterior chest      strap, vest     []  decrease forward movement of shoulder  []  decrease forward movement of trunk  []  safety/stability  []  added abdominal support  []  trunk alignment  []  assistance with shoulder control   []  decrease shoulder elevation    [x]  Headrest  - 10 inch multi axle hardware    [x]  provide posterior head support  []  provide posterior neck support  []  provide lateral head support  []  provide anterior head support  []  support during tilt and recline  []  improve feeding     []  improve respiration  []  placement of switches  []  safety    []  accommodate ROM   []  accommodate tone  []  improve visual orientation   [x]  Headrest           []  fixed [x]  removable []  flip down      Mounting hardware   []  swing-away laterals/switches  [x]  mount headrest   [x]  mounts headrest flip down or  removable for transfers  []  mount headrest swing-away laterals   []  mount switches     []  Neck Support    []  decrease neck rotation  []  decrease forward neck flexion   Pelvic Positioner    [x]  std hip belt          []  padded hip belt  []  dual pull hip belt  []  four point hip belt  []  stabilize tone  [x]  decrease falling out of chair  []  prevent excessive extension  []  special pull angle to control      rotation  []   pad for protection over boney   prominence  []  promote comfort    []  Essential needs        bag/pouch   []  medicines []  special food rorthotics []  clothing changes  []  diapers  []  catheter/hygiene []  ostomy supplies   The above equipment has a life- long use expectancy.  Growth and changes in medical and/or functional conditions would be the exceptions.   SUMMARY:    ASSESSMENT: Timed up and Go test: 49 seconds with RW and CGA (high fall risk)  CLINICAL IMPRESSION: The patient is a 64 year old female evaluated for power mobility due to significant functional limitations impacting safe and independent mobility. Her past medical history is complex and includes GI bleed, coronary artery disease (CAD), heart failure with reduced ejection fraction (HFrEF), chronic kidney disease stage IV, carotid stenosis status post endarterectomy, paroxysmal atrial fibrillation, peripheral arterial disease (PAD), chronic obstructive pulmonary disease (COPD), GERD, chronic bradycardia, gout, hypertension, type II diabetes mellitus, hyperlipidemia, vitamin D  deficiency, and prior substance abuse. The patient is also a current smoker. 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 The patient demonstrates weakness in bilateral upper and lower extremities, which significantly limits endurance, strength, and functional mobility. The patient has a documented history of sacral pressure ulcers within the past year related to prolonged sitting. She  currently sits for the majority of the day and is at moderate risk for pressure ulcer recurrence due to impaired sensation, limited ability to independently perform weight shifts, compromised circulation, and reduced activity tolerance.  The patient is able  to stand briefly using a walker for transfers; however, this does not meet her mobility needs in the home or community due to impaired balance, endurance, lower extremity pain, and cardiopulmonary limitations. Ambulation is not functional or safe as a primary means of mobility. Inability to Use Lower-Level Mobility Devices  Manual Wheelchair: The patient is unable to propel a manual wheelchair due to bilateral upper extremity weakness, bilateral hand pain, lower extremity neuropathic pain, knee degenerative joint disease, and significant cardiopulmonary impairments. Manual propulsion would place excessive stress on her cardiovascular and respiratory systems, increasing fatigue and risk of medical exacerbation.  Scooter: A scooter is not appropriate due to her upper extremity weakness, impaired endurance, postural limitations, and need for specialized seating and pressure relief. She also lacks the trunk stability and tolerance required for safe scooter use.   Medical Necessity for Group 2 Power Wheelchair A Group 2 power wheelchair is the most cost-effective mobility option that meets [facility] coverage criteria and allows the patient to safely and independently complete mobility-related activities of daily living (MRADLs) within the home, including toileting, bathing, meal preparation, and household mobility. The patient has the cognitive ability, visual capacity, and motivation to safely operate a power wheelchair and has demonstrated the ability to use power mobility controls during evaluation. Medical Necessity for Power Tilt Power tilt is medically necessary and not for convenience due to the following clinical needs:   Pressure Injury Prevention: The patient has a recent history of pressure ulcers and currently lacks the strength and endurance to perform effective manual pressure relief. Power tilt provides independent weight shifting, significantly reducing pressure on the sacrum during prolonged  sitting.   Postural Support and Fatigue Management: Power tilt allows the patient to rest during the day and manage fatigue associated with cardiovascular and respiratory conditions.   Cardiopulmonary Support: Tilt assists in decreasing respiratory distress, improving comfort for a patient with COPD and HFrEF.   Circulatory Support: Power tilt improves venous return, which is medically indicated given her PAD, CAF history, and lower extremity amputations.   Without power tilt, the patient is at high risk for skin breakdown, increased pain, reduced sitting tolerance, respiratory compromise, and decreased independence, which would likely result in increased caregiver burden, medical complications, and potential hospitalizations.  Due to severe multisystem impairments, inability to use a manual wheelchair or scooter, documented risk of pressure injury, and cardiopulmonary limitations, a Group 2 power wheelchair with power tilt is medically necessary to ensure safe, functional, and independent mobility. This equipment will reduce fall risk, prevent secondary complications, and allow the patient to remain safely in her home environment.  OBJECTIVE IMPAIRMENTS Abnormal gait, cardiopulmonary status limiting activity, decreased activity tolerance, decreased balance, decreased endurance, decreased knowledge of use of DME, decreased mobility, difficulty walking, decreased ROM, decreased strength, decreased safety awareness, hypomobility, increased edema, increased fascial restrictions, increased muscle spasms, impaired flexibility, postural dysfunction, and pain.   ACTIVITY LIMITATIONS carrying, lifting, bending, standing, squatting, stairs, transfers, bathing, toileting, dressing, hygiene/grooming, and caring for others  PARTICIPATION LIMITATIONS: meal prep, cleaning, laundry, driving, shopping, and community activity  PERSONAL FACTORS Age, Time since onset of injury/illness/exacerbation, and 3+  comorbidities: CHF, CKD, COPD, DM are also affecting patient's functional outcome.   REHAB POTENTIAL: Good  CLINICAL DECISION MAKING: Stable/uncomplicated  EVALUATION  COMPLEXITY: High                                   GOALS: One time visit. No goals established.    PLAN: PT FREQUENCY: one time visit    Raj LOISE Blanch, PT 11/20/2024, 11:05 AM    I concur with the above findings and recommendations of the therapist:  Physician name printed:         Physician's signature:      Date:      "

## 2024-11-25 ENCOUNTER — Other Ambulatory Visit (HOSPITAL_COMMUNITY): Payer: Self-pay | Admitting: Cardiology

## 2024-11-25 ENCOUNTER — Other Ambulatory Visit (HOSPITAL_COMMUNITY): Payer: Self-pay

## 2024-11-25 DIAGNOSIS — I5020 Unspecified systolic (congestive) heart failure: Secondary | ICD-10-CM

## 2024-11-25 MED ORDER — POTASSIUM CHLORIDE CRYS ER 20 MEQ PO TBCR: 40.0000 meq | EXTENDED_RELEASE_TABLET | Freq: Two times a day (BID) | ORAL | 3 refills | Status: AC

## 2024-11-25 NOTE — Progress Notes (Signed)
 Paramedicine Encounter    Patient ID: [name], female    DOB: 1961/03/16, 64 y.o.   MRN: [id number]   Complaints-leg pain   Edema-slight   Compliance with meds-yes-but confused about what to hold   Pill box filled-yes If so, by whom-paramedic   Refills needed- Levothyroxine  Torsemide  Pantoprazole   Eliquis     Pt reports she is having her chronic leg pain, she felt swelling and tightness to her legs and had increased urination.  Today the swelling to her legs is a little improved.  Pt denies increased sob.   She did miss a few doses of her meds b/c she was confused again about what to take and hold prior to her procedure, so reviewed with her again.  She needs her meds refilled but there is some issues with the mail order and local pharmacy-mail order has filled them without her permission but unk if they have sent it so I called the [facility] on phone to verify what exactly is going on.   They do have her meds ready to go and will over night them this once since all is ready and will be sent out in pill bottles.  Moving forward will go back to local pharmacy per patient preference.   Meds verified and pill box refilled.   Labeled her pill box with Friday to be sure she takes that day on Friday since it is missing the torsemide  for her procedure.   She has not taken any  meds other than her thyroid  pill today-so gave the morn dose to her now.  Pt denies increased sob, no dizziness, no c/p. Legs to me doesn't look bad, she reports they still feel tight but again she missed a couple days of her torsemide .  Back on track now.   Will f/u next week.   BP 130/70   Pulse 86   Resp 16   Wt 217 lb (98.4 kg)   LMP  (LMP Unknown)   SpO2 94%   BMI 38.44 kg/m  Weight yesterday-217 Last visit weight-216  Patient Care Team: Campbell Reynolds, NP as PCP - [address] Court Dorn PARAS, MD as PCP - Cardiology (Cardiology) Rolan Ezra GORMAN, MD as PCP - Advanced Heart Failure  (Cardiology) Inocencio Soyla Lunger, MD as PCP - Electrophysiology (Cardiology) Court Dorn PARAS, MD as Consulting Physician (Cardiology) Darlean Ozell NOVAK, MD as Consulting Physician (Pulmonary Disease) System, Provider Not In  Patient Active Problem List   Diagnosis Date Noted   Precordial pain 10/05/2024   Elevated troponin 10/05/2024   Acute HFrEF (heart failure with reduced ejection fraction) ([name]) 10/05/2024   High risk medication use 10/05/2024   Long term current use of antiarrhythmic drug 10/05/2024   Chronic kidney disease, stage IV (severe) ([name]) 10/05/2024   Anemia 10/05/2024   Melanotic stools 10/05/2024   Cardiomyopathy ([name]) 10/05/2024   Acute on chronic congestive heart failure ([name]) 10/04/2024   Acute on chronic anemia 10/04/2024   COPD with acute exacerbation ([name]) 10/03/2024   Acute cystitis 10/03/2024   History of CEA (carotid endarterectomy) 10/03/2024   Gout 10/03/2024   Anemia of chronic renal failure 08/06/2024   Persistent atrial fibrillation ([name]) 03/15/2024   Benign neoplasm of ascending colon 03/14/2024   Benign neoplasm of transverse colon 03/14/2024   Benign neoplasm of descending colon 03/14/2024   Benign neoplasm of sigmoid colon 03/14/2024   Chronic systolic congestive heart failure ([name]) 03/12/2024   Melena 03/12/2024   Heme positive stool 03/11/2024  GI bleed 03/08/2024   History of CAD (coronary artery disease) 03/08/2024   CKD (chronic kidney disease), stage IV ([name]) 03/08/2024   Continuous dependence on cigarette smoking 03/08/2024   GAD (generalized anxiety disorder) 03/08/2024   HFrEF (heart failure with reduced ejection fraction) ([name]) 10/11/2023   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) and diastolic (congestive) heart failure ([name]) 10/10/2023   Pre-ulcerative calluses 04/25/2022   PAF (paroxysmal atrial fibrillation) ([name]) 05/05/2021   COPD (chronic obstructive pulmonary disease) ([name]) 05/05/2021   OSA (obstructive sleep  apnea) 05/05/2021   Stage 3b chronic kidney disease ([name]) 05/05/2021   Pressure injury of skin 05/04/2021   Diabetic nephropathy ([name]) 01/02/2021   Low back pain 06/01/2020   Hyperlipidemia 04/03/2020   Peripheral artery disease 12/26/2019   NICM (nonischemic cardiomyopathy) ([name]) 06/20/2019   Non-healing ulcer ([name]) 06/20/2019   Acute CHF (congestive heart failure) ([name]) 11/26/2018   Coagulation disorder 08/09/2017   Depression 07/21/2017   At risk for adverse drug reaction 06/20/2017   Peripheral neuropathy 06/20/2017   S/P transmetatarsal amputation of foot, right ([name]) 06/05/2017   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) 05/19/2017   Obesity, class 2 02/24/2016   Anticoagulation management encounter 02/10/2016   Chronic sinus bradycardia 01/12/2016   Essential hypertension 12/22/2015   Demand ischemia ([name])    Acute on chronic diastolic CHF (congestive heart failure) ([name])    Symptomatic anemia 11/08/2015   Hypokalemia 11/08/2015   Tobacco abuse 10/23/2015   Coronary artery disease    DOE (dyspnea on exertion) 04/29/2015   Paroxysmal atrial fibrillation ([name]) 01/16/2015   Carotid artery stenosis 01/16/2015   Insomnia 02/03/2014   S/P peripheral artery angioplasty - TurboHawk atherectomy; R SFA 09/11/2013    Class: Acute   Leg pain, bilateral 08/19/2013   Hypothyroidism 07/31/2013   History of cocaine abuse ([name]) 06/13/2013   Long term current use of anticoagulant therapy 05/20/2013   Alcohol abuse    Narcotic abuse ([name])    Marijuana abuse    Alcoholic cirrhosis ([name])    Insulin  dependent type 2 diabetes mellitus ([name])    Current Medications[1] Allergies[2]    Social History   Socioeconomic History   Marital status: Single    Spouse name: Not on file   Number of children: 1   Years of education: 12   Highest education level: 12th grade  Occupational History   Occupation: disabled  Tobacco Use    Smoking status: Every Day    Current packs/day: 1.00    Average packs/day: 1 pack/day for 44.0 years (44.0 ttl pk-yrs)    Types: E-cigarettes, Cigarettes   Smokeless tobacco: Former    Types: Snuff  Vaping Use   Vaping status: Former   Devices: 11/26/2018 stopped months ago  Substance and Sexual Activity   Alcohol use: Not Currently    Comment: occ   Drug use: Yes    Types: Crack cocaine, Marijuana, Oxycodone    Sexual activity: Not Currently  Other Topics Concern   Not on file  Social History Narrative   ** Merged History Encounter **       Lives in [place], in motel with sister.  They are looking to move but don't have a place to go yet.     Social Drivers of Health   Tobacco Use: High Risk (11/20/2024)   Patient History    Smoking Tobacco Use: Every Day    Smokeless Tobacco Use: Former    Passive Exposure: Not on  file  Financial Resource Strain: Low Risk (02/09/2023)   Overall Financial Resource Strain (CARDIA)    Difficulty of Paying Living Expenses: Not hard at all  Food Insecurity: No Food Insecurity (10/05/2024)   Epic    Worried About Programme Researcher, Broadcasting/film/video in the Last Year: Never true    Ran Out of Food in the Last Year: Never true  Transportation Needs: Unmet Transportation Needs (10/05/2024)   Epic    Lack of Transportation (Medical): Yes    Lack of Transportation (Non-Medical): Yes  Physical Activity: Inactive (02/09/2023)   Exercise Vital Sign    Days of Exercise per Week: 0 days    Minutes of Exercise per Session: 0 min  Stress: No Stress Concern Present (02/09/2023)   [employer] of Occupational [employer] - Occupational Stress Questionnaire    Feeling of Stress : Not at all  Social Connections: Unknown (08/21/2023)   Received from [facility]   Social Network    Social Network: Not on file  Intimate Partner Violence: Not At Risk (10/05/2024)   Epic    Fear of Current or Ex-Partner: No    Emotionally Abused: No    Physically Abused: No    Sexually  Abused: No  Depression (PHQ2-9): Low Risk (02/09/2023)   Depression (PHQ2-9)    PHQ-2 Score: 0  Alcohol Screen: Not on file  Housing: Low Risk (10/05/2024)   Epic    Unable to Pay for Housing in the Last Year: No    Number of Times Moved in the Last Year: 0    Homeless in the Last Year: No  Utilities: Not At Risk (10/05/2024)   Epic    Threatened with loss of utilities: No  Health Literacy: Not on file    Physical Exam      Future Appointments  Date Time Provider Department Center  11/26/2024 10:55 AM Aniceto Daphne CROME, NP [facility]  01/14/2025 11:00 AM Dartha Ernst, MD [facility] None       [name], Paramedic [phone] [facility] Paramedic  11/25/2024     [1]  Current Outpatient Medications:    acetaminophen  (TYLENOL ) 500 MG tablet, Take 1,000 mg by mouth every 6 (six) hours as needed for headache (pain)., Disp: , Rfl:    albuterol  (VENTOLIN  HFA) 108 (90 Base) MCG/ACT inhaler, USE 2 PUFFS BY MOUTH EVERY FOUR HOURS, AS NEEDED, FOR COUGHING/WHEEZING, Disp: 8.5 g, Rfl: 0   Alcohol Swabs (ALCOHOL PREP) 70 % PADS, USE TO CLEAN SKIN *NEW PRESCRIPTION REQUEST*, Disp: 100 each, Rfl: 10   allopurinol  (ZYLOPRIM ) 100 MG tablet, Take 100 mg by mouth daily., Disp: , Rfl:    amiodarone  (PACERONE ) 200 MG tablet, TAKE 1 TABLET BY MOUTH ONCE DAILY *NEW PRESCRIPTION REQUEST*, Disp: 90 tablet, Rfl: 3   budesonide -formoterol  (SYMBICORT ) 160-4.5 MCG/ACT inhaler, Inhale 2 puffs into the lungs 2 (two) times daily., Disp: 1 each, Rfl: 2   Cholecalciferol  (VITAMIN D3) 50 MCG (2000 UT) capsule, Take 1 capsule by mouth every morning., Disp: , Rfl:    docusate sodium  (COLACE) 100 MG capsule, Take 2 capsules (200 mg total) by mouth at bedtime., Disp: 10 capsule, Rfl: 0   ELIQUIS  5 MG TABS tablet, TAKE ONE (1) TABLET BY MOUTH TWICE DAILY *NEW PRESCRIPTION REQUEST*, Disp: 180 tablet, Rfl: 10   folic acid  (FOLVITE ) 1 MG tablet, Take 1 tablet (1 mg total) by mouth daily., Disp: 30  tablet, Rfl: 0   Glucagon  (BAQSIMI  ONE PACK) 3 MG/DOSE POWD, Place 1 Device into the nose  as needed (Low blood sugar with impaired consciousness)., Disp: 2 each, Rfl: 3   insulin  glargine (LANTUS ) 100 UNIT/ML injection, Inject 58 Units into the skin at bedtime., Disp: , Rfl:    insulin  lispro (HUMALOG ) 100 UNIT/ML KwikPen, Inject 8 Units into the skin 3 (three) times daily with meals. Plus sliding scale, max dose 70 units /day, Disp: , Rfl:    isosorbide -hydrALAZINE  (BIDIL ) 20-37.5 MG tablet, TAKE 1 TABLET BY MOUTH THREE TIMES DAILY *NEW PRESCRIPTION REQUEST*, Disp: 270 tablet, Rfl: 10   levothyroxine  (SYNTHROID ) 50 MCG tablet, TAKE 1 TABLET (50 MCG TOTAL) BY MOUTH DAILY BEFORE BREAKFAST (AM), Disp: 30 tablet, Rfl: 0   Menthol -Camphor (ICY HOT PRO NO MESS EX), Apply 1 Application topically 3 (three) times daily as needed (arms, neuropathy lower extrimity, gout in hand.)., Disp: , Rfl:    metoprolol  succinate (TOPROL -XL) 25 MG 24 hr tablet, Take 1 tablet (25 mg total) by mouth daily., Disp: 60 tablet, Rfl: 0   NYAMYC  powder, Apply 1 Application topically 2 (two) times daily., Disp: , Rfl:    nystatin  cream (MYCOSTATIN ), Apply 1 Application topically 2 (two) times daily as needed for dry skin., Disp: , Rfl:    Omega-3 Fatty Acids (FISH OIL PO), Take 1 tablet by mouth daily., Disp: , Rfl:    pantoprazole  (PROTONIX ) 40 MG tablet, TAKE 1 TABLET (40 MG TOTAL) BY MOUTH DAILY(AM), Disp: 30 tablet, Rfl: 0   pantoprazole  (PROTONIX ) 40 MG tablet, Take 1 tablet (40 mg total) by mouth daily., Disp: 30 tablet, Rfl: 0   polyethylene glycol (MIRALAX  / GLYCOLAX ) 17 g packet, Take 17 g by mouth daily as needed for moderate constipation., Disp: 14 each, Rfl: 0   potassium chloride  SA (KLOR-CON  M) 20 MEQ tablet, Take 2 tablets (40 mEq total) by mouth 2 (two) times daily., Disp: 200 tablet, Rfl: 3   rosuvastatin  (CRESTOR ) 40 MG tablet, TAKE 1 TABLET BY MOUTH DAILY *NEW PRESCRIPTION REQUEST*, Disp: 90 tablet, Rfl: 10    tirzepatide  (MOUNJARO ) 7.5 MG/0.5ML Pen, INJECT 7.5 MG SUBCUTANEOUSLY ONCE WEEKLY *NEW PRESCRIPTION REQUEST*, Disp: 6 mL, Rfl: 0   torsemide  (DEMADEX ) 20 MG tablet, Take 4 tablets (80 mg total) by mouth 2 (two) times daily., Disp: 540 tablet, Rfl: 10   VITAMIN E PO, Take 1 tablet by mouth daily., Disp: , Rfl:    Continuous Glucose Receiver (DEXCOM G7 RECEIVER) DEVI, 1 Device by Does not apply route continuous. (Patient not taking: Reported on 11/18/2024), Disp: 1 each, Rfl: 0   Continuous Glucose Receiver (FREESTYLE LIBRE 3 READER) DEVI, 1 Device by Does not apply route continuous., Disp: 1 each, Rfl: 0   Continuous Glucose Sensor (DEXCOM G7 SENSOR) MISC, 1 Device by Does not apply route continuous. (Patient not taking: Reported on 11/18/2024), Disp: 9 each, Rfl: 3 [2]  Allergies Allergen Reactions   Neurontin  [Gabapentin ] Nausea And Vomiting and Other (See Comments)    POSSIBLE SHAKING   Lyrica  [Pregabalin ] Other (See Comments)    Shaking

## 2024-11-26 ENCOUNTER — Encounter: Payer: Self-pay | Admitting: Pulmonary Disease

## 2024-11-26 ENCOUNTER — Ambulatory Visit: Attending: Pulmonary Disease | Admitting: Pulmonary Disease

## 2024-11-26 VITALS — BP 128/80 | HR 83 | Ht 63.0 in | Wt 217.4 lb

## 2024-11-26 DIAGNOSIS — I480 Paroxysmal atrial fibrillation: Secondary | ICD-10-CM

## 2024-11-26 NOTE — Patient Instructions (Addendum)
 Medication Instructions:  Your physician recommends that you continue on your current medications as directed. Please refer to the Current Medication list given to you today.  *If you need a refill on your cardiac medications before your next appointment, please call your pharmacy*  Lab Work: TSH, FreeT4, LFT'S-TODAY If you have labs (blood work) drawn today and your tests are completely normal, you will receive your results only by: MyChart Message (if you have MyChart) OR A paper copy in the mail If you have any lab test that is abnormal or we need to change your treatment, we will call you to review the results.  Follow-Up: At [facility], you and your health needs are our priority.  As part of our continuing mission to provide you with exceptional heart care, our providers are all part of one team.  This team includes your primary Cardiologist (physician) and Advanced Practice Providers or APPs (Physician Assistants and Nurse Practitioners) who all work together to provide you with the care you need, when you need it.  Your next appointment:   6 month(s)  Provider:   Dr Inocencio or Daphne Barrack, NP

## 2024-11-26 NOTE — Progress Notes (Signed)
 " Electrophysiology Office Note:   Date:  11/26/2024  ID:  [name], DOB 1961/06/01, MRN [id number]  Primary Cardiologist: Dorn Lesches, MD Primary Heart Failure: Ezra Shuck, MD Electrophysiologist: Soyla Gladis Norton, MD      History of Present Illness:   [name] is a 64 y.o. female with h/o AF, HFrEF, CAD, HLD, carotid stenosis s/p CEA, PAD, anemia, tobacco abuse, ETOH abuse hx, cirrhosis, COPD, gout, DM, hypothyroidism & GIB seen today for routine electrophysiology followup.   Admitted in April 2025 for GIB.   Admitted 11/20-11/29/25 for recurrent GIB requiring transfusion of PRBC. Felt to be a poor candidate for deep enteroscopy.  Eliquis  was resumed and Hgb remained stable.  Outpatient Palliative Care referral was placed.   Since last being seen in our clinic the patient reports her bleeding has cleared up. She had dark tarry stools and this has resolved. She remains on eliquis  with no missed doses. She was planned for DCCV after her 11/15/24 Clinic visit with AHF.  She states she is pending an eye exam.  She denies dry cough, tremor, dizziness / lightheadedness.   She denies chest pain, palpitations, dyspnea, PND, orthopnea, nausea, vomiting, dizziness, syncope, edema, weight gain, or early satiety.   Review of systems complete and found to be negative unless listed in HPI.   EP Information / Studies Reviewed:    EKG is ordered today. Personal review as below.  EKG Interpretation Date/Time:  Tuesday November 26 2024 11:20:27 EST Ventricular Rate:  83 PR Interval:  172 QRS Duration:  124 QT Interval:  462 QTC Calculation: 542 R Axis:   -6  Text Interpretation: Normal sinus rhythm Confirmed by Aniceto Jarvis ([id number]) on 11/26/2024 12:25:28 PM    Arrhythmia / AAD / Pertinent EP Studies AF  DCCV 887975, 09/2024 Amiodarone  > long term use, intermittently at least since 2014   Risk Assessment/Calculations:    CHA2DS2-VASc Score = 5   This indicates a 7.2% annual  risk of stroke. The patient's score is based upon: CHF History: 1 HTN History: 1 Diabetes History: 1 Stroke History: 0 Vascular Disease History: 1 Age Score: 0 Gender Score: 1             Physical Exam:   VS:  BP 128/80   Pulse 83   Ht 5' 3 (1.6 m)   Wt 217 lb 6.4 oz (98.6 kg)   LMP  (LMP Unknown)   SpO2 96%   BMI 38.51 kg/m    Wt Readings from Last 3 Encounters:  11/26/24 217 lb 6.4 oz (98.6 kg)  11/25/24 217 lb (98.4 kg)  11/18/24 216 lb (98 kg)     GEN: chronically ill appearing adult female in NAD, in wheelchair  NECK: No JVD; No carotid bruits CARDIAC: Regular rate and rhythm, no murmurs, rubs, gallops RESPIRATORY:  Clear to auscultation without rales, wheezing or rhonchi  ABDOMEN: Soft, non-tender, non-distended EXTREMITIES:  Chronic venous stasis / LE edema. Small (eraser sized) superficial round ulceration on anterior LLE) No deformity   ASSESSMENT AND PLAN:    Paroxysmal Atrial Fibrillation  High Risk Medication Monitoring: Amiodarone   CHA2DS2-VASc 5, hx DCCV -OAC for stroke prophylaxis  -amiodarone  100 mg daily  -update amio labs > TSH, free T4, LFT's    -eye exam pending, no evidence of fibrosis on 2V CXR 09/2024 (images reviewed) -Toprol  25 mg daily  -pt remains in NSR, will cancel DCCV   Secondary Hypercoagulable State  -continue Eliquis  5mg  BID, dose reviewed and appropriate  by age / wt -no further bleeding   -if recurrent GI bleeding, could consider LAAO > though given her LE wound  / / DM hx, she may not be a candidate due to infection risk  HFmrEF  Severe MV Regurgitation  -follows with advanced HF Team   -cardiorenal syndrome limits GDMT   CAD  PVD HLD -per Cardiology   Hypertension  -well controlled on current regimen     Follow up with Dr. Inocencio / EP APP in 6 months  Signed, Daphne Barrack, NP-C, AGACNP-BC [facility] - Electrophysiology  11/26/2024, 12:28 PM  "

## 2024-11-27 ENCOUNTER — Telehealth (HOSPITAL_COMMUNITY): Payer: Self-pay

## 2024-11-27 LAB — HEPATIC FUNCTION PANEL
ALT: 26 IU/L (ref 0–32)
AST: 17 IU/L (ref 0–40)
Albumin: 4 g/dL (ref 3.9–4.9)
Alkaline Phosphatase: 129 IU/L (ref 49–135)
Bilirubin Total: 0.2 mg/dL (ref 0.0–1.2)
Bilirubin, Direct: 0.09 mg/dL (ref 0.00–0.40)
Total Protein: 6.4 g/dL (ref 6.0–8.5)

## 2024-11-27 LAB — TSH: TSH: 3.77 u[IU]/mL (ref 0.450–4.500)

## 2024-11-27 LAB — T4, FREE: Free T4: 1.54 ng/dL (ref 0.82–1.77)

## 2024-11-28 ENCOUNTER — Ambulatory Visit: Payer: Self-pay | Admitting: Pulmonary Disease

## 2024-11-28 ENCOUNTER — Telehealth (HOSPITAL_COMMUNITY): Payer: Self-pay

## 2024-11-28 NOTE — Telephone Encounter (Signed)
 I called mail order pharmacy to question why meds werent delivered on Tuesday, per mail order they will be sent out today by 2pm.  Will let ms Filla know.   [name], EMT-Paramedic  [phone] 11/28/2024

## 2024-11-28 NOTE — Telephone Encounter (Signed)
 I reached out to pt as I seen that her cardioversion was cancelled for Friday.  She went to see Brandi on [address] and she was back in NSR so it was cancelled.   I also asked if her meds were delivered as mail order promised it was suppose to been overnight and delivered on Tuesday and they had not been.  I will call the mail order tomor.   [name], EMT-Paramedic  [phone] 11/27/2024

## 2024-11-29 ENCOUNTER — Encounter (HOSPITAL_COMMUNITY): Payer: Self-pay

## 2024-11-29 ENCOUNTER — Ambulatory Visit (HOSPITAL_COMMUNITY): Admit: 2024-11-29 | Admitting: Cardiology

## 2024-11-29 DIAGNOSIS — I4819 Other persistent atrial fibrillation: Secondary | ICD-10-CM

## 2024-11-29 SURGERY — CARDIOVERSION (CATH LAB)
Anesthesia: Monitor Anesthesia Care

## 2024-12-02 ENCOUNTER — Other Ambulatory Visit (HOSPITAL_COMMUNITY): Payer: Self-pay

## 2024-12-02 NOTE — Progress Notes (Unsigned)
 Paramedicine Encounter    Patient ID: [name], female    DOB: [birth year], 64 y.o.   MRN: [id number]   Complaints-chronic foot pain -  Edema-yes-chronic but improved   Compliance with meds-yes  Pill box filled-yes If so, by whom-paramedic   Refills needed-no     Pt reports she is doing ok other than her chronic foot pain. She also reports she didn't sleep good last night and her shoulders and arms hurting.  She denies increased sob, no dizziness, no c/p. Appetite ok. No bloody or black stools.  Her cardioversion got cancelled last week due to her being back in NSR.  She feels sleepy today with not being able to sleep last night.  She tried to use her cardiomems and was on it when I got here and it was stuck in sending mode. I checked on my end and it hadnt sent anything yet. They lost signal at some time last night with the TV so that may have something to do with it-poor service.   She did get delivery of her meds last week. She did not get the amio and potassium.   Her med list needs to be updated again.  Not sure how this list got incorrect but it is.  Pantoprazole  duplicate Metoprolol  25mg  daily- duplicate  Allopurinol -100mg  daily   Meds verified and pill box refilled.   She finally got the cardiomems to work and send. It shows to be read at 34--her goal is 25,  not sure if that is accurate-she was laying on it oddly on the loveseat with it propped up at an angle--  She will continue to use it daily. Her weight is stable.  She had not taken any meds yet this morning. She has taken her thyroid  pill dose now and will take am dose shortly.   Sent message to triage ref repeat labs.    BP (!) 140/70   Pulse 62   Resp 17   Wt 217 lb (98.4 kg)   LMP  (LMP Unknown)   SpO2 97%   BMI 38.44 kg/m  Weight yesterday-217  Last visit weight-217   Patient Care Team: Campbell Reynolds, NP as PCP - [address] Court Dorn PARAS, MD as PCP - Cardiology (Cardiology) Rolan Ezra GORMAN, MD as PCP - Advanced Heart Failure (Cardiology) Inocencio Soyla Lunger, MD as PCP - Electrophysiology (Cardiology) Court Dorn PARAS, MD as Consulting Physician (Cardiology) Darlean Ozell NOVAK, MD as Consulting Physician (Pulmonary Disease) System, Provider Not In  Patient Active Problem List   Diagnosis Date Noted   Precordial pain 10/05/2024   Elevated troponin 10/05/2024   Acute HFrEF (heart failure with reduced ejection fraction) ([name]) 10/05/2024   High risk medication use 10/05/2024   Long term current use of antiarrhythmic drug 10/05/2024   Chronic kidney disease, stage IV (severe) ([name]) 10/05/2024   Anemia 10/05/2024   Melanotic stools 10/05/2024   Cardiomyopathy ([name]) 10/05/2024   Acute on chronic congestive heart failure ([name]) 10/04/2024   Acute on chronic anemia 10/04/2024   COPD with acute exacerbation ([name]) 10/03/2024   Acute cystitis 10/03/2024   History of CEA (carotid endarterectomy) 10/03/2024   Gout 10/03/2024   Anemia of chronic renal failure 08/06/2024   Persistent atrial fibrillation ([name]) 03/15/2024   Benign neoplasm of ascending colon 03/14/2024   Benign neoplasm of transverse colon 03/14/2024   Benign neoplasm of descending colon 03/14/2024   Benign neoplasm of sigmoid colon 03/14/2024   Chronic systolic congestive heart failure ([name]) 03/12/2024  Melena 03/12/2024   Heme positive stool 03/11/2024   GI bleed 03/08/2024   History of CAD (coronary artery disease) 03/08/2024   CKD (chronic kidney disease), stage IV ([name]) 03/08/2024   Continuous dependence on cigarette smoking 03/08/2024   GAD (generalized anxiety disorder) 03/08/2024   HFrEF (heart failure with reduced ejection fraction) ([name]) 10/11/2023   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) and diastolic (congestive) heart failure ([name]) 10/10/2023   Pre-ulcerative calluses 04/25/2022   PAF (paroxysmal atrial fibrillation) ([name]) 05/05/2021   COPD (chronic  obstructive pulmonary disease) ([name]) 05/05/2021   OSA (obstructive sleep apnea) 05/05/2021   Stage 3b chronic kidney disease ([name]) 05/05/2021   Pressure injury of skin 05/04/2021   Diabetic nephropathy ([name]) 01/02/2021   Low back pain 06/01/2020   Hyperlipidemia 04/03/2020   Peripheral artery disease 12/26/2019   NICM (nonischemic cardiomyopathy) ([name]) 06/20/2019   Non-healing ulcer ([name]) 06/20/2019   Acute CHF (congestive heart failure) ([name]) 11/26/2018   Coagulation disorder 08/09/2017   Depression 07/21/2017   At risk for adverse drug reaction 06/20/2017   Peripheral neuropathy 06/20/2017   S/P transmetatarsal amputation of foot, right ([name]) 06/05/2017   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) 05/19/2017   Obesity, class 2 02/24/2016   Anticoagulation management encounter 02/10/2016   Chronic sinus bradycardia 01/12/2016   Essential hypertension 12/22/2015   Demand ischemia ([name])    Acute on chronic diastolic CHF (congestive heart failure) ([name])    Symptomatic anemia 11/08/2015   Hypokalemia 11/08/2015   Tobacco abuse 10/23/2015   Coronary artery disease    DOE (dyspnea on exertion) 04/29/2015   Paroxysmal atrial fibrillation ([name]) 01/16/2015   Carotid artery stenosis 01/16/2015   Insomnia 02/03/2014   S/P peripheral artery angioplasty - TurboHawk atherectomy; R SFA 09/11/2013    Class: Acute   Leg pain, bilateral 08/19/2013   Hypothyroidism 07/31/2013   History of cocaine abuse ([name]) 06/13/2013   Long term current use of anticoagulant therapy 05/20/2013   Alcohol abuse    Narcotic abuse ([name])    Marijuana abuse    Alcoholic cirrhosis ([name])    Insulin  dependent type 2 diabetes mellitus ([name])    Current Medications[1] Allergies[2]    Social History   Socioeconomic History   Marital status: Single    Spouse name: Not on file   Number of children: 1   Years of  education: 12   Highest education level: 12th grade  Occupational History   Occupation: disabled  Tobacco Use   Smoking status: Every Day    Current packs/day: 1.00    Average packs/day: 1 pack/day for 44.0 years (44.0 ttl pk-yrs)    Types: E-cigarettes, Cigarettes   Smokeless tobacco: Former    Types: Snuff  Vaping Use   Vaping status: Former   Devices: 11/26/2018 stopped months ago  Substance and Sexual Activity   Alcohol use: Not Currently    Comment: occ   Drug use: Yes    Types: Crack cocaine, Marijuana, Oxycodone    Sexual activity: Not Currently  Other Topics Concern   Not on file  Social History Narrative   ** Merged History Encounter **       Lives in [place], in motel with sister.  They are looking to move but don't have a place to go yet.     Social Drivers of Health   Tobacco Use: High Risk (11/26/2024)   Patient History    Smoking Tobacco Use: Every Day    Smokeless  Tobacco Use: Former    Passive Exposure: Not on Actuary Strain: Low Risk (02/09/2023)   Overall Financial Resource Strain (CARDIA)    Difficulty of Paying Living Expenses: Not hard at all  Food Insecurity: No Food Insecurity (10/05/2024)   Epic    Worried About Programme Researcher, Broadcasting/film/video in the Last Year: Never true    Ran Out of Food in the Last Year: Never true  Transportation Needs: Unmet Transportation Needs (10/05/2024)   Epic    Lack of Transportation (Medical): Yes    Lack of Transportation (Non-Medical): Yes  Physical Activity: Inactive (02/09/2023)   Exercise Vital Sign    Days of Exercise per Week: 0 days    Minutes of Exercise per Session: 0 min  Stress: No Stress Concern Present (02/09/2023)   [employer] of Occupational [employer] - Occupational Stress Questionnaire    Feeling of Stress : Not at all  Social Connections: Unknown (08/21/2023)   Received from [facility]   Social Network    Social Network: Not on file  Intimate Partner Violence:  Not At Risk (10/05/2024)   Epic    Fear of Current or Ex-Partner: No    Emotionally Abused: No    Physically Abused: No    Sexually Abused: No  Depression (PHQ2-9): Low Risk (02/09/2023)   Depression (PHQ2-9)    PHQ-2 Score: 0  Alcohol Screen: Not on file  Housing: Low Risk (10/05/2024)   Epic    Unable to Pay for Housing in the Last Year: No    Number of Times Moved in the Last Year: 0    Homeless in the Last Year: No  Utilities: Not At Risk (10/05/2024)   Epic    Threatened with loss of utilities: No  Health Literacy: Not on file    Physical Exam      Future Appointments  Date Time Provider Department Center  01/14/2025 11:00 AM Dartha Ernst, MD [facility] None       [name], Paramedic [phone] [facility] Paramedic  12/02/24      [1]  Current Outpatient Medications:    albuterol  (VENTOLIN  HFA) 108 (90 Base) MCG/ACT inhaler, USE 2 PUFFS BY MOUTH EVERY FOUR HOURS, AS NEEDED, FOR COUGHING/WHEEZING, Disp: 8.5 g, Rfl: 0   Alcohol Swabs (ALCOHOL PREP) 70 % PADS, USE TO CLEAN SKIN *NEW PRESCRIPTION REQUEST*, Disp: 100 each, Rfl: 10   allopurinol  (ZYLOPRIM ) 100 MG tablet, Take 50 mg by mouth daily. (Patient taking differently: Take 100 mg by mouth daily.), Disp: , Rfl:    amiodarone  (PACERONE ) 200 MG tablet, TAKE 1 TABLET BY MOUTH ONCE DAILY *NEW PRESCRIPTION REQUEST*, Disp: 90 tablet, Rfl: 3   budesonide -formoterol  (SYMBICORT ) 160-4.5 MCG/ACT inhaler, Inhale 2 puffs into the lungs 2 (two) times daily., Disp: 1 each, Rfl: 2   ELIQUIS  5 MG TABS tablet, TAKE ONE (1) TABLET BY MOUTH TWICE DAILY *NEW PRESCRIPTION REQUEST*, Disp: 180 tablet, Rfl: 10   insulin  glargine (LANTUS ) 100 UNIT/ML injection, Inject 52 Units into the skin at bedtime., Disp: , Rfl:    insulin  lispro (HUMALOG ) 100 UNIT/ML KwikPen, Inject 8 Units into the skin 3 (three) times daily with meals. Plus sliding scale, max dose 70 units /day (Patient taking differently: Inject 30  Units into the skin 3 (three) times daily with meals. + sliding scale), Disp: , Rfl:    isosorbide -hydrALAZINE  (BIDIL ) 20-37.5 MG tablet, TAKE 1 TABLET BY MOUTH THREE TIMES DAILY *NEW PRESCRIPTION REQUEST*, Disp: 270 tablet, Rfl: 10   levothyroxine  (  SYNTHROID ) 50 MCG tablet, TAKE 1 TABLET (50 MCG TOTAL) BY MOUTH DAILY BEFORE BREAKFAST (AM), Disp: 30 tablet, Rfl: 0   metoprolol  succinate (TOPROL -XL) 25 MG 24 hr tablet, Take 1 tablet (25 mg total) by mouth daily., Disp: 60 tablet, Rfl: 0   NYAMYC  powder, Apply 1 Application topically 2 (two) times daily as needed (for skin folds)., Disp: , Rfl:    Omega-3 Fatty Acids (FISH OIL PO), Take 1 capsule by mouth daily., Disp: , Rfl:    pantoprazole  (PROTONIX ) 40 MG tablet, Take 1 tablet (40 mg total) by mouth daily., Disp: 30 tablet, Rfl: 0   potassium chloride  SA (KLOR-CON  M) 20 MEQ tablet, Take 2 tablets (40 mEq total) by mouth 2 (two) times daily., Disp: 200 tablet, Rfl: 3   rosuvastatin  (CRESTOR ) 40 MG tablet, TAKE 1 TABLET BY MOUTH DAILY *NEW PRESCRIPTION REQUEST* (Patient taking differently: Take 40 mg by mouth at bedtime.), Disp: 90 tablet, Rfl: 10   tirzepatide  (MOUNJARO ) 7.5 MG/0.5ML Pen, INJECT 7.5 MG SUBCUTANEOUSLY ONCE WEEKLY *NEW PRESCRIPTION REQUEST*, Disp: 6 mL, Rfl: 0   torsemide  (DEMADEX ) 20 MG tablet, Take 4 tablets (80 mg total) by mouth 2 (two) times daily., Disp: 540 tablet, Rfl: 10   VITAMIN E PO, Take 1 tablet by mouth daily., Disp: , Rfl:    acetaminophen  (TYLENOL ) 500 MG tablet, Take 1,000 mg by mouth every 6 (six) hours as needed for headache or moderate pain (pain score 4-6)., Disp: , Rfl:    ascorbic acid (VITAMIN C) 500 MG tablet, Take 500 mg by mouth daily., Disp: , Rfl:    buPROPion  (WELLBUTRIN  XL) 300 MG 24 hr tablet, Take 300 mg by mouth daily. (Patient not taking: Reported on 12/02/2024), Disp: , Rfl:    Cholecalciferol  (VITAMIN D3) 50 MCG (2000 UT) capsule, Take 2,000 Units by mouth every morning., Disp: , Rfl:     Continuous Glucose Receiver (DEXCOM G7 RECEIVER) DEVI, 1 Device by Does not apply route continuous., Disp: 1 each, Rfl: 0   Continuous Glucose Receiver (FREESTYLE LIBRE 3 READER) DEVI, 1 Device by Does not apply route continuous., Disp: 1 each, Rfl: 0   Continuous Glucose Sensor (DEXCOM G7 SENSOR) MISC, 1 Device by Does not apply route continuous., Disp: 9 each, Rfl: 3   docusate sodium  (COLACE) 100 MG capsule, Take 2 capsules (200 mg total) by mouth at bedtime., Disp: 10 capsule, Rfl: 0   folic acid  (FOLVITE ) 1 MG tablet, Take 1 tablet (1 mg total) by mouth daily., Disp: 30 tablet, Rfl: 0   Glucagon  (BAQSIMI  ONE PACK) 3 MG/DOSE POWD, Place 1 Device into the nose as needed (Low blood sugar with impaired consciousness)., Disp: 2 each, Rfl: 3   Menthol -Camphor (ICY HOT PRO NO MESS EX), Apply 1 Application topically 3 (three) times daily as needed (arms, neuropathy lower extrimity, gout in hand.)., Disp: , Rfl:    metolazone  (ZAROXOLYN ) 2.5 MG tablet, Take 2.5 mg by mouth daily. (Patient not taking: Reported on 12/02/2024), Disp: , Rfl:    metoprolol  succinate (TOPROL -XL) 50 MG 24 hr tablet, Take 25 mg by mouth daily. Take with or immediately following a meal. (Patient not taking: Reported on 12/02/2024), Disp: , Rfl:    nystatin  cream (MYCOSTATIN ), Apply 1 Application topically 2 (two) times daily as needed (for skin folds)., Disp: , Rfl:    pantoprazole  (PROTONIX ) 40 MG tablet, TAKE 1 TABLET (40 MG TOTAL) BY MOUTH DAILY(AM) (Patient not taking: Reported on 12/02/2024), Disp: 30 tablet, Rfl: 0   polyethylene glycol (MIRALAX  /  GLYCOLAX ) 17 g packet, Take 17 g by mouth daily as needed for moderate constipation., Disp: 14 each, Rfl: 0 [2] Allergies Allergen Reactions   Neurontin  [Gabapentin ] Nausea And Vomiting and Other (See Comments)    POSSIBLE SHAKING   Lyrica  [Pregabalin ] Other (See Comments)    Shaking

## 2024-12-03 ENCOUNTER — Other Ambulatory Visit: Payer: Self-pay | Admitting: Acute Care

## 2024-12-03 DIAGNOSIS — F17210 Nicotine dependence, cigarettes, uncomplicated: Secondary | ICD-10-CM

## 2024-12-03 DIAGNOSIS — Z87891 Personal history of nicotine dependence: Secondary | ICD-10-CM

## 2024-12-03 DIAGNOSIS — R911 Solitary pulmonary nodule: Secondary | ICD-10-CM

## 2024-12-03 DIAGNOSIS — Z122 Encounter for screening for malignant neoplasm of respiratory organs: Secondary | ICD-10-CM

## 2024-12-04 ENCOUNTER — Other Ambulatory Visit (HOSPITAL_COMMUNITY): Payer: Self-pay

## 2024-12-04 NOTE — Progress Notes (Signed)
 Paramedicine Encounter    Patient ID: [name], female    DOB: 03-21-1961, 64 y.o.   MRN: [id number]  Came out to day to refill pts pill box to ensure she has meds early next week in case the winter weather prevents me from coming in.  She has pill box refilled for one week.    Patient Care Team: Campbell Reynolds, NP as PCP - General Court Dorn PARAS, MD as PCP - Cardiology (Cardiology) Rolan Ezra GORMAN, MD as PCP - Advanced Heart Failure (Cardiology) Inocencio Soyla Lunger, MD as PCP - Electrophysiology (Cardiology) Court Dorn PARAS, MD as Consulting Physician (Cardiology) Darlean Ozell NOVAK, MD as Consulting Physician (Pulmonary Disease) System, Provider Not In  Patient Active Problem List   Diagnosis Date Noted   Precordial pain 10/05/2024   Elevated troponin 10/05/2024   Acute HFrEF (heart failure with reduced ejection fraction) ([name]) 10/05/2024   High risk medication use 10/05/2024   Long term current use of antiarrhythmic drug 10/05/2024   Chronic kidney disease, stage IV (severe) ([name]) 10/05/2024   Anemia 10/05/2024   Melanotic stools 10/05/2024   Cardiomyopathy ([name]) 10/05/2024   Acute on chronic congestive heart failure ([name]) 10/04/2024   Acute on chronic anemia 10/04/2024   COPD with acute exacerbation ([name]) 10/03/2024   Acute cystitis 10/03/2024   History of CEA (carotid endarterectomy) 10/03/2024   Gout 10/03/2024   Anemia of chronic renal failure 08/06/2024   Persistent atrial fibrillation ([name]) 03/15/2024   Benign neoplasm of ascending colon 03/14/2024   Benign neoplasm of transverse colon 03/14/2024   Benign neoplasm of descending colon 03/14/2024   Benign neoplasm of sigmoid colon 03/14/2024   Chronic systolic congestive heart failure ([name]) 03/12/2024   Melena 03/12/2024   Heme positive stool 03/11/2024   GI bleed 03/08/2024   History of CAD (coronary artery disease) 03/08/2024   CKD (chronic kidney disease), stage IV ([name]) 03/08/2024   Continuous  dependence on cigarette smoking 03/08/2024   GAD (generalized anxiety disorder) 03/08/2024   HFrEF (heart failure with reduced ejection fraction) ([name]) 10/11/2023   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) and diastolic (congestive) heart failure ([name]) 10/10/2023   Pre-ulcerative calluses 04/25/2022   PAF (paroxysmal atrial fibrillation) ([name]) 05/05/2021   COPD (chronic obstructive pulmonary disease) ([name]) 05/05/2021   OSA (obstructive sleep apnea) 05/05/2021   Stage 3b chronic kidney disease ([name]) 05/05/2021   Pressure injury of skin 05/04/2021   Diabetic nephropathy ([name]) 01/02/2021   Low back pain 06/01/2020   Hyperlipidemia 04/03/2020   Peripheral artery disease 12/26/2019   NICM (nonischemic cardiomyopathy) ([name]) 06/20/2019   Non-healing ulcer ([name]) 06/20/2019   Acute CHF (congestive heart failure) ([name]) 11/26/2018   Coagulation disorder 08/09/2017   Depression 07/21/2017   At risk for adverse drug reaction 06/20/2017   Peripheral neuropathy 06/20/2017   S/P transmetatarsal amputation of foot, right ([name]) 06/05/2017   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) 05/19/2017   Obesity, class 2 02/24/2016   Anticoagulation management encounter 02/10/2016   Chronic sinus bradycardia 01/12/2016   Essential hypertension 12/22/2015   Demand ischemia ([name])    Acute on chronic diastolic CHF (congestive heart failure) ([name])    Symptomatic anemia 11/08/2015   Hypokalemia 11/08/2015   Tobacco abuse 10/23/2015   Coronary artery disease    DOE (dyspnea on exertion) 04/29/2015   Paroxysmal atrial fibrillation ([name]) 01/16/2015   Carotid artery stenosis 01/16/2015   Insomnia 02/03/2014   S/P peripheral artery angioplasty - Laurelyn  atherectomy; R SFA 09/11/2013    Class: Acute   Leg pain, bilateral 08/19/2013   Hypothyroidism 07/31/2013   History of cocaine abuse ([name]) 06/13/2013   Long term current use of anticoagulant  therapy 05/20/2013   Alcohol abuse    Narcotic abuse ([name])    Marijuana abuse    Alcoholic cirrhosis ([name])    Insulin  dependent type 2 diabetes mellitus ([name])    Current Medications[1] Allergies[2]    Social History   Socioeconomic History   Marital status: Single    Spouse name: Not on file   Number of children: 1   Years of education: 12   Highest education level: 12th grade  Occupational History   Occupation: disabled  Tobacco Use   Smoking status: Every Day    Current packs/day: 1.00    Average packs/day: 1 pack/day for 44.0 years (44.0 ttl pk-yrs)    Types: E-cigarettes, Cigarettes   Smokeless tobacco: Former    Types: Snuff  Vaping Use   Vaping status: Former   Devices: 11/26/2018 stopped months ago  Substance and Sexual Activity   Alcohol use: Not Currently    Comment: occ   Drug use: Yes    Types: Crack cocaine, Marijuana, Oxycodone    Sexual activity: Not Currently  Other Topics Concern   Not on file  Social History Narrative   ** Merged History Encounter **       Lives in [place], in motel with sister.  They are looking to move but don't have a place to go yet.     Social Drivers of Health   Tobacco Use: High Risk (11/26/2024)   Patient History    Smoking Tobacco Use: Every Day    Smokeless Tobacco Use: Former    Passive Exposure: Not on Actuary Strain: Low Risk (02/09/2023)   Overall Financial Resource Strain (CARDIA)    Difficulty of Paying Living Expenses: Not hard at all  Food Insecurity: No Food Insecurity (10/05/2024)   Epic    Worried About Programme Researcher, Broadcasting/film/video in the Last Year: Never true    Ran Out of Food in the Last Year: Never true  Transportation Needs: Unmet Transportation Needs (10/05/2024)   Epic    Lack of Transportation (Medical): Yes    Lack of Transportation (Non-Medical): Yes  Physical Activity: Inactive (02/09/2023)   Exercise Vital Sign    Days of Exercise per Week: 0 days    Minutes of Exercise per Session:  0 min  Stress: No Stress Concern Present (02/09/2023)   [employer] - Occupational Stress Questionnaire    Feeling of Stress : Not at all  Social Connections: Unknown (08/21/2023)   Received from [facility]   Social Network    Social Network: Not on file  Intimate Partner Violence: Not At Risk (10/05/2024)   Epic    Fear of Current or Ex-Partner: No    Emotionally Abused: No    Physically Abused: No    Sexually Abused: No  Depression (PHQ2-9): Low Risk (02/09/2023)   Depression (PHQ2-9)    PHQ-2 Score: 0  Alcohol Screen: Not on file  Housing: Low Risk (10/05/2024)   Epic    Unable to Pay for Housing in the Last Year: No    Number of Times Moved in the Last Year: 0    Homeless in the Last Year: No  Utilities: Not At Risk (10/05/2024)   Epic    Threatened with loss of utilities: No  Health  Literacy: Not on file    Physical Exam      Future Appointments  Date Time Provider Department Center  12/18/2024 11:40 AM [facility] 1 [facility] [address] [facility]  01/14/2025 11:00 AM Dartha Ernst, MD [id number] None       [name], Paramedic [phone] [facility] Paramedic  12/04/24    [1]  Current Outpatient Medications:    acetaminophen  (TYLENOL ) 500 MG tablet, Take 1,000 mg by mouth every 6 (six) hours as needed for headache or moderate pain (pain score 4-6)., Disp: , Rfl:    albuterol  (VENTOLIN  HFA) 108 (90 Base) MCG/ACT inhaler, USE 2 PUFFS BY MOUTH EVERY FOUR HOURS, AS NEEDED, FOR COUGHING/WHEEZING, Disp: 8.5 g, Rfl: 0   Alcohol Swabs (ALCOHOL PREP) 70 % PADS, USE TO CLEAN SKIN *NEW PRESCRIPTION REQUEST*, Disp: 100 each, Rfl: 10   allopurinol  (ZYLOPRIM ) 100 MG tablet, Take 50 mg by mouth daily. (Patient taking differently: Take 100 mg by mouth daily.), Disp: , Rfl:    amiodarone  (PACERONE ) 200 MG tablet, TAKE 1 TABLET BY MOUTH ONCE DAILY *NEW PRESCRIPTION REQUEST*, Disp: 90 tablet, Rfl: 3   ascorbic acid (VITAMIN C) 500 MG tablet, Take  500 mg by mouth daily., Disp: , Rfl:    budesonide -formoterol  (SYMBICORT ) 160-4.5 MCG/ACT inhaler, Inhale 2 puffs into the lungs 2 (two) times daily., Disp: 1 each, Rfl: 2   buPROPion  (WELLBUTRIN  XL) 300 MG 24 hr tablet, Take 300 mg by mouth daily. (Patient not taking: Reported on 12/02/2024), Disp: , Rfl:    Cholecalciferol  (VITAMIN D3) 50 MCG (2000 UT) capsule, Take 2,000 Units by mouth every morning., Disp: , Rfl:    Continuous Glucose Receiver (DEXCOM G7 RECEIVER) DEVI, 1 Device by Does not apply route continuous., Disp: 1 each, Rfl: 0   Continuous Glucose Receiver (FREESTYLE LIBRE 3 READER) DEVI, 1 Device by Does not apply route continuous., Disp: 1 each, Rfl: 0   Continuous Glucose Sensor (DEXCOM G7 SENSOR) MISC, 1 Device by Does not apply route continuous., Disp: 9 each, Rfl: 3   docusate sodium  (COLACE) 100 MG capsule, Take 2 capsules (200 mg total) by mouth at bedtime., Disp: 10 capsule, Rfl: 0   ELIQUIS  5 MG TABS tablet, TAKE ONE (1) TABLET BY MOUTH TWICE DAILY *NEW PRESCRIPTION REQUEST*, Disp: 180 tablet, Rfl: 10   folic acid  (FOLVITE ) 1 MG tablet, Take 1 tablet (1 mg total) by mouth daily., Disp: 30 tablet, Rfl: 0   Glucagon  (BAQSIMI  ONE PACK) 3 MG/DOSE POWD, Place 1 Device into the nose as needed (Low blood sugar with impaired consciousness)., Disp: 2 each, Rfl: 3   insulin  glargine (LANTUS ) 100 UNIT/ML injection, Inject 52 Units into the skin at bedtime., Disp: , Rfl:    insulin  lispro (HUMALOG ) 100 UNIT/ML KwikPen, Inject 8 Units into the skin 3 (three) times daily with meals. Plus sliding scale, max dose 70 units /day (Patient taking differently: Inject 30 Units into the skin 3 (three) times daily with meals. + sliding scale), Disp: , Rfl:    isosorbide -hydrALAZINE  (BIDIL ) 20-37.5 MG tablet, TAKE 1 TABLET BY MOUTH THREE TIMES DAILY *NEW PRESCRIPTION REQUEST*, Disp: 270 tablet, Rfl: 10   levothyroxine  (SYNTHROID ) 50 MCG tablet, TAKE 1 TABLET (50 MCG TOTAL) BY MOUTH DAILY BEFORE BREAKFAST  (AM), Disp: 30 tablet, Rfl: 0   Menthol -Camphor (ICY HOT PRO NO MESS EX), Apply 1 Application topically 3 (three) times daily as needed (arms, neuropathy lower extrimity, gout in hand.)., Disp: , Rfl:    metolazone  (ZAROXOLYN ) 2.5 MG tablet, Take  2.5 mg by mouth daily. (Patient not taking: Reported on 12/02/2024), Disp: , Rfl:    metoprolol  succinate (TOPROL -XL) 25 MG 24 hr tablet, Take 1 tablet (25 mg total) by mouth daily., Disp: 60 tablet, Rfl: 0   metoprolol  succinate (TOPROL -XL) 50 MG 24 hr tablet, Take 25 mg by mouth daily. Take with or immediately following a meal. (Patient not taking: Reported on 12/02/2024), Disp: , Rfl:    NYAMYC  powder, Apply 1 Application topically 2 (two) times daily as needed (for skin folds)., Disp: , Rfl:    nystatin  cream (MYCOSTATIN ), Apply 1 Application topically 2 (two) times daily as needed (for skin folds)., Disp: , Rfl:    Omega-3 Fatty Acids (FISH OIL PO), Take 1 capsule by mouth daily., Disp: , Rfl:    pantoprazole  (PROTONIX ) 40 MG tablet, TAKE 1 TABLET (40 MG TOTAL) BY MOUTH DAILY(AM) (Patient not taking: Reported on 12/02/2024), Disp: 30 tablet, Rfl: 0   pantoprazole  (PROTONIX ) 40 MG tablet, Take 1 tablet (40 mg total) by mouth daily., Disp: 30 tablet, Rfl: 0   polyethylene glycol (MIRALAX  / GLYCOLAX ) 17 g packet, Take 17 g by mouth daily as needed for moderate constipation., Disp: 14 each, Rfl: 0   potassium chloride  SA (KLOR-CON  M) 20 MEQ tablet, Take 2 tablets (40 mEq total) by mouth 2 (two) times daily., Disp: 200 tablet, Rfl: 3   rosuvastatin  (CRESTOR ) 40 MG tablet, TAKE 1 TABLET BY MOUTH DAILY *NEW PRESCRIPTION REQUEST* (Patient taking differently: Take 40 mg by mouth at bedtime.), Disp: 90 tablet, Rfl: 10   tirzepatide  (MOUNJARO ) 7.5 MG/0.5ML Pen, INJECT 7.5 MG SUBCUTANEOUSLY ONCE WEEKLY *NEW PRESCRIPTION REQUEST*, Disp: 6 mL, Rfl: 0   torsemide  (DEMADEX ) 20 MG tablet, Take 4 tablets (80 mg total) by mouth 2 (two) times daily., Disp: 540 tablet, Rfl: 10    VITAMIN E PO, Take 1 tablet by mouth daily., Disp: , Rfl:  [2]  Allergies Allergen Reactions   Neurontin  [Gabapentin ] Nausea And Vomiting and Other (See Comments)    POSSIBLE SHAKING   Lyrica  [Pregabalin ] Other (See Comments)    Shaking

## 2024-12-11 ENCOUNTER — Other Ambulatory Visit (HOSPITAL_COMMUNITY): Payer: Self-pay

## 2024-12-11 NOTE — Progress Notes (Signed)
 Paramedicine Encounter    Patient ID: [name] Sluder, female    DOB: 1961/05/12, 64 y.o.   MRN: [id number]   Complaints-chronic foot/hand pain   Edema-per usual  Compliance with meds-yes  Pill box filled-yes If so, by whom-paramedic  Refills needed-none  Pt reports she is doing ok. She denies sob, no dizziness, no c/p. Her weight is stable. Legs look good today.  Appetite good. No bleeding issues.  No missed doses of her meds.  Meds verified and pill box refilled.    BP 130/64   Pulse (!) 58   Resp 17   Wt 216 lb (98 kg)   LMP  (LMP Unknown)   SpO2 95%   BMI 38.26 kg/m  Weight yesterday-217  Last visit weight-217  Patient Care Team: Campbell Reynolds, NP as PCP - [address] Court Dorn PARAS, MD as PCP - Cardiology (Cardiology) Rolan Ezra GORMAN, MD as PCP - Advanced Heart Failure (Cardiology) Inocencio Soyla Lunger, MD as PCP - Electrophysiology (Cardiology) Court Dorn PARAS, MD as Consulting Physician (Cardiology) Darlean Ozell NOVAK, MD as Consulting Physician (Pulmonary Disease) System, Provider Not In  Patient Active Problem List   Diagnosis Date Noted   Precordial pain 10/05/2024   Elevated troponin 10/05/2024   Acute HFrEF (heart failure with reduced ejection fraction) ([name]) 10/05/2024   High risk medication use 10/05/2024   Long term current use of antiarrhythmic drug 10/05/2024   Chronic kidney disease, stage IV (severe) ([name]) 10/05/2024   Anemia 10/05/2024   Melanotic stools 10/05/2024   Cardiomyopathy ([name]) 10/05/2024   Acute on chronic congestive heart failure ([name]) 10/04/2024   Acute on chronic anemia 10/04/2024   COPD with acute exacerbation ([name]) 10/03/2024   Acute cystitis 10/03/2024   History of CEA (carotid endarterectomy) 10/03/2024   Gout 10/03/2024   Anemia of chronic renal failure 08/06/2024   Persistent atrial fibrillation ([name]) 03/15/2024   Benign neoplasm of ascending colon 03/14/2024   Benign neoplasm of transverse colon 03/14/2024   Benign  neoplasm of descending colon 03/14/2024   Benign neoplasm of sigmoid colon 03/14/2024   Chronic systolic congestive heart failure ([name]) 03/12/2024   Melena 03/12/2024   Heme positive stool 03/11/2024   GI bleed 03/08/2024   History of CAD (coronary artery disease) 03/08/2024   CKD (chronic kidney disease), stage IV ([name]) 03/08/2024   Continuous dependence on cigarette smoking 03/08/2024   GAD (generalized anxiety disorder) 03/08/2024   HFrEF (heart failure with reduced ejection fraction) ([name]) 10/11/2023   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) and diastolic (congestive) heart failure ([name]) 10/10/2023   Pre-ulcerative calluses 04/25/2022   PAF (paroxysmal atrial fibrillation) ([name]) 05/05/2021   COPD (chronic obstructive pulmonary disease) ([name]) 05/05/2021   OSA (obstructive sleep apnea) 05/05/2021   Stage 3b chronic kidney disease ([name]) 05/05/2021   Pressure injury of skin 05/04/2021   Diabetic nephropathy ([name]) 01/02/2021   Low back pain 06/01/2020   Hyperlipidemia 04/03/2020   Peripheral artery disease 12/26/2019   NICM (nonischemic cardiomyopathy) ([name]) 06/20/2019   Non-healing ulcer ([name]) 06/20/2019   Acute CHF (congestive heart failure) ([name]) 11/26/2018   Coagulation disorder 08/09/2017   Depression 07/21/2017   At risk for adverse drug reaction 06/20/2017   Peripheral neuropathy 06/20/2017   S/P transmetatarsal amputation of foot, right ([name]) 06/05/2017   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) 05/19/2017   Obesity, class 2 02/24/2016   Anticoagulation management encounter 02/10/2016   Chronic sinus bradycardia 01/12/2016   Essential hypertension 12/22/2015  Demand ischemia ([name])    Acute on chronic diastolic CHF (congestive heart failure) ([name])    Symptomatic anemia 11/08/2015   Hypokalemia 11/08/2015   Tobacco abuse 10/23/2015   Coronary artery disease    DOE (dyspnea on exertion) 04/29/2015    Paroxysmal atrial fibrillation ([name]) 01/16/2015   Carotid artery stenosis 01/16/2015   Insomnia 02/03/2014   S/P peripheral artery angioplasty - TurboHawk atherectomy; R SFA 09/11/2013    Class: Acute   Leg pain, bilateral 08/19/2013   Hypothyroidism 07/31/2013   History of cocaine abuse ([name]) 06/13/2013   Long term current use of anticoagulant therapy 05/20/2013   Alcohol abuse    Narcotic abuse ([name])    Marijuana abuse    Alcoholic cirrhosis ([name])    Insulin  dependent type 2 diabetes mellitus ([name])    Current Medications[1] Allergies[2]    Social History   Socioeconomic History   Marital status: Single    Spouse name: Not on file   Number of children: 1   Years of education: 12   Highest education level: 12th grade  Occupational History   Occupation: disabled  Tobacco Use   Smoking status: Every Day    Current packs/day: 1.00    Average packs/day: 1 pack/day for 44.0 years (44.0 ttl pk-yrs)    Types: E-cigarettes, Cigarettes   Smokeless tobacco: Former    Types: Snuff  Vaping Use   Vaping status: Former   Devices: 11/26/2018 stopped months ago  Substance and Sexual Activity   Alcohol use: Not Currently    Comment: occ   Drug use: Yes    Types: Crack cocaine, Marijuana, Oxycodone    Sexual activity: Not Currently  Other Topics Concern   Not on file  Social History Narrative   ** Merged History Encounter **       Lives in [place], in motel with sister.  They are looking to move but don't have a place to go yet.     Social Drivers of Health   Tobacco Use: High Risk (11/26/2024)   Patient History    Smoking Tobacco Use: Every Day    Smokeless Tobacco Use: Former    Passive Exposure: Not on Actuary Strain: Low Risk (02/09/2023)   Overall Financial Resource Strain (CARDIA)    Difficulty of Paying Living Expenses: Not hard at all  Food Insecurity: No Food Insecurity (10/05/2024)   Epic    Worried About Programme Researcher, Broadcasting/film/video in the Last Year:  Never true    Ran Out of Food in the Last Year: Never true  Transportation Needs: Unmet Transportation Needs (10/05/2024)   Epic    Lack of Transportation (Medical): Yes    Lack of Transportation (Non-Medical): Yes  Physical Activity: Inactive (02/09/2023)   Exercise Vital Sign    Days of Exercise per Week: 0 days    Minutes of Exercise per Session: 0 min  Stress: No Stress Concern Present (02/09/2023)   [employer] - Occupational Stress Questionnaire    Feeling of Stress : Not at all  Social Connections: Unknown (08/21/2023)   Received from [facility]   Social Network    Social Network: Not on file  Intimate Partner Violence: Not At Risk (10/05/2024)   Epic    Fear of Current or Ex-Partner: No    Emotionally Abused: No    Physically Abused: No    Sexually Abused: No  Depression (PHQ2-9): Low Risk (02/09/2023)   Depression (PHQ2-9)    PHQ-2 Score: 0  Alcohol Screen: Not on file  Housing: Low Risk (10/05/2024)   Epic    Unable to Pay for Housing in the Last Year: No    Number of Times Moved in the Last Year: 0    Homeless in the Last Year: No  Utilities: Not At Risk (10/05/2024)   Epic    Threatened with loss of utilities: No  Health Literacy: Not on file    Physical Exam      Future Appointments  Date Time Provider Department Center  12/18/2024 11:40 AM [facility] 1 [facility] [address] [facility]  01/14/2025 11:00 AM Dartha Ernst, MD [facility] None       [name], Paramedic [phone] [facility] Paramedic  12/11/24    [1]  Current Outpatient Medications:    acetaminophen  (TYLENOL ) 500 MG tablet, Take 1,000 mg by mouth every 6 (six) hours as needed for headache or moderate pain (pain score 4-6)., Disp: , Rfl:    albuterol  (VENTOLIN  HFA) 108 (90 Base) MCG/ACT inhaler, USE 2 PUFFS BY MOUTH EVERY FOUR HOURS, AS NEEDED, FOR COUGHING/WHEEZING, Disp: 8.5 g, Rfl: 0   Alcohol Swabs (ALCOHOL PREP) 70 % PADS, USE TO CLEAN SKIN *NEW  PRESCRIPTION REQUEST*, Disp: 100 each, Rfl: 10   amiodarone  (PACERONE ) 200 MG tablet, TAKE 1 TABLET BY MOUTH ONCE DAILY *NEW PRESCRIPTION REQUEST*, Disp: 90 tablet, Rfl: 3   budesonide -formoterol  (SYMBICORT ) 160-4.5 MCG/ACT inhaler, Inhale 2 puffs into the lungs 2 (two) times daily., Disp: 1 each, Rfl: 2   Cholecalciferol  (VITAMIN D3) 50 MCG (2000 UT) capsule, Take 2,000 Units by mouth every morning., Disp: , Rfl:    ELIQUIS  5 MG TABS tablet, TAKE ONE (1) TABLET BY MOUTH TWICE DAILY *NEW PRESCRIPTION REQUEST*, Disp: 180 tablet, Rfl: 10   isosorbide -hydrALAZINE  (BIDIL ) 20-37.5 MG tablet, TAKE 1 TABLET BY MOUTH THREE TIMES DAILY *NEW PRESCRIPTION REQUEST*, Disp: 270 tablet, Rfl: 10   levothyroxine  (SYNTHROID ) 50 MCG tablet, TAKE 1 TABLET (50 MCG TOTAL) BY MOUTH DAILY BEFORE BREAKFAST (AM), Disp: 30 tablet, Rfl: 0   metoprolol  succinate (TOPROL -XL) 25 MG 24 hr tablet, Take 1 tablet (25 mg total) by mouth daily., Disp: 60 tablet, Rfl: 0   pantoprazole  (PROTONIX ) 40 MG tablet, Take 1 tablet (40 mg total) by mouth daily., Disp: 30 tablet, Rfl: 0   potassium chloride  SA (KLOR-CON  M) 20 MEQ tablet, Take 2 tablets (40 mEq total) by mouth 2 (two) times daily., Disp: 200 tablet, Rfl: 3   tirzepatide  (MOUNJARO ) 7.5 MG/0.5ML Pen, INJECT 7.5 MG SUBCUTANEOUSLY ONCE WEEKLY *NEW PRESCRIPTION REQUEST*, Disp: 6 mL, Rfl: 0   torsemide  (DEMADEX ) 20 MG tablet, Take 4 tablets (80 mg total) by mouth 2 (two) times daily., Disp: 540 tablet, Rfl: 10   VITAMIN E PO, Take 1 tablet by mouth daily., Disp: , Rfl:    allopurinol  (ZYLOPRIM ) 100 MG tablet, Take 50 mg by mouth daily. (Patient taking differently: Take 100 mg by mouth daily.), Disp: , Rfl:    ascorbic acid (VITAMIN C) 500 MG tablet, Take 500 mg by mouth daily., Disp: , Rfl:    buPROPion  (WELLBUTRIN  XL) 300 MG 24 hr tablet, Take 300 mg by mouth daily. (Patient not taking: Reported on 12/02/2024), Disp: , Rfl:    Continuous Glucose Receiver (DEXCOM G7 RECEIVER) DEVI, 1  Device by Does not apply route continuous., Disp: 1 each, Rfl: 0   Continuous Glucose Receiver (FREESTYLE LIBRE 3 READER) DEVI, 1 Device by Does not apply route continuous., Disp: 1 each, Rfl: 0   Continuous Glucose  Sensor (DEXCOM G7 SENSOR) MISC, 1 Device by Does not apply route continuous., Disp: 9 each, Rfl: 3   docusate sodium  (COLACE) 100 MG capsule, Take 2 capsules (200 mg total) by mouth at bedtime., Disp: 10 capsule, Rfl: 0   folic acid  (FOLVITE ) 1 MG tablet, Take 1 tablet (1 mg total) by mouth daily., Disp: 30 tablet, Rfl: 0   Glucagon  (BAQSIMI  ONE PACK) 3 MG/DOSE POWD, Place 1 Device into the nose as needed (Low blood sugar with impaired consciousness)., Disp: 2 each, Rfl: 3   insulin  glargine (LANTUS ) 100 UNIT/ML injection, Inject 52 Units into the skin at bedtime., Disp: , Rfl:    insulin  lispro (HUMALOG ) 100 UNIT/ML KwikPen, Inject 8 Units into the skin 3 (three) times daily with meals. Plus sliding scale, max dose 70 units /day (Patient taking differently: Inject 30 Units into the skin 3 (three) times daily with meals. + sliding scale), Disp: , Rfl:    Menthol -Camphor (ICY HOT PRO NO MESS EX), Apply 1 Application topically 3 (three) times daily as needed (arms, neuropathy lower extrimity, gout in hand.)., Disp: , Rfl:    metolazone  (ZAROXOLYN ) 2.5 MG tablet, Take 2.5 mg by mouth daily. (Patient not taking: Reported on 12/02/2024), Disp: , Rfl:    metoprolol  succinate (TOPROL -XL) 50 MG 24 hr tablet, Take 25 mg by mouth daily. Take with or immediately following a meal. (Patient not taking: Reported on 12/02/2024), Disp: , Rfl:    NYAMYC  powder, Apply 1 Application topically 2 (two) times daily as needed (for skin folds)., Disp: , Rfl:    nystatin  cream (MYCOSTATIN ), Apply 1 Application topically 2 (two) times daily as needed (for skin folds)., Disp: , Rfl:    Omega-3 Fatty Acids (FISH OIL PO), Take 1 capsule by mouth daily., Disp: , Rfl:    pantoprazole  (PROTONIX ) 40 MG tablet, TAKE 1 TABLET  (40 MG TOTAL) BY MOUTH DAILY(AM) (Patient not taking: Reported on 12/02/2024), Disp: 30 tablet, Rfl: 0   polyethylene glycol (MIRALAX  / GLYCOLAX ) 17 g packet, Take 17 g by mouth daily as needed for moderate constipation., Disp: 14 each, Rfl: 0   rosuvastatin  (CRESTOR ) 40 MG tablet, TAKE 1 TABLET BY MOUTH DAILY *NEW PRESCRIPTION REQUEST* (Patient taking differently: Take 40 mg by mouth at bedtime.), Disp: 90 tablet, Rfl: 10 [2]  Allergies Allergen Reactions   Neurontin  [Gabapentin ] Nausea And Vomiting and Other (See Comments)    POSSIBLE SHAKING   Lyrica  [Pregabalin ] Other (See Comments)    Shaking

## 2024-12-18 ENCOUNTER — Other Ambulatory Visit (HOSPITAL_COMMUNITY): Payer: Self-pay

## 2024-12-18 ENCOUNTER — Inpatient Hospital Stay: Admission: RE | Admit: 2024-12-18 | Source: Ambulatory Visit

## 2024-12-18 NOTE — Progress Notes (Unsigned)
 Paramedicine Encounter    Patient ID: [name], female    DOB: [birth year], 64 y.o.   MRN: [id number]   Complaints-leg pain   Edema-yes  Compliance with meds-yes  Pill box filled-yes If so, by whom-paramedic   Refills needed-none   Pt reports she is doing better, earlier today we had spoken when I had called about sch a time with her and she had c/o that her leg was hurting so badly from her hip to her foot and she was fearful of it being a blood clot, I had told her to go to ER but she wanted to wait it out and see what her sister was doing when she got back home and see if she could take her, but the pain subsided and decided to stay home.  +PMS in both feet. Right now she denies any leg pain.  She denies increased sob, no dizziness, no c/p. Compliant with meds.   Meds verified and pill box refilled   Will f/u next week.    BP (!) 122/50   Pulse 60   Resp 18   Wt 217 lb (98.4 kg)   LMP  (LMP Unknown)   SpO2 96%   BMI 38.44 kg/m  Weight yesterday-217  Last visit weight-216   Patient Care Team: Campbell Reynolds, NP as PCP - General Court Dorn PARAS, MD as PCP - Cardiology (Cardiology) Karla Ezra GORMAN, MD as PCP - Advanced Heart Failure (Cardiology) Inocencio Soyla Lunger, MD as PCP - Electrophysiology (Cardiology) Court Dorn PARAS, MD as Consulting Physician (Cardiology) Darlean Ozell NOVAK, MD as Consulting Physician (Pulmonary Disease) System, Provider Not In  Patient Active Problem List   Diagnosis Date Noted   Precordial pain 10/05/2024   Elevated troponin 10/05/2024   Acute HFrEF (heart failure with reduced ejection fraction) ([name]) 10/05/2024   High risk medication use 10/05/2024   Long term current use of antiarrhythmic drug 10/05/2024   Chronic kidney disease, stage IV (severe) ([name]) 10/05/2024   Anemia 10/05/2024   Melanotic stools 10/05/2024   Cardiomyopathy ([name]) 10/05/2024   Acute on chronic congestive heart failure ([name]) 10/04/2024   Acute on chronic  anemia 10/04/2024   COPD with acute exacerbation ([name]) 10/03/2024   Acute cystitis 10/03/2024   History of CEA (carotid endarterectomy) 10/03/2024   Gout 10/03/2024   Anemia of chronic renal failure 08/06/2024   Persistent atrial fibrillation ([name]) 03/15/2024   Benign neoplasm of ascending colon 03/14/2024   Benign neoplasm of transverse colon 03/14/2024   Benign neoplasm of descending colon 03/14/2024   Benign neoplasm of sigmoid colon 03/14/2024   Chronic systolic congestive heart failure ([name]) 03/12/2024   Melena 03/12/2024   Heme positive stool 03/11/2024   GI bleed 03/08/2024   History of CAD (coronary artery disease) 03/08/2024   CKD (chronic kidney disease), stage IV ([name]) 03/08/2024   Continuous dependence on cigarette smoking 03/08/2024   GAD (generalized anxiety disorder) 03/08/2024   HFrEF (heart failure with reduced ejection fraction) ([name]) 10/11/2023   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) and diastolic (congestive) heart failure ([name]) 10/10/2023   Pre-ulcerative calluses 04/25/2022   PAF (paroxysmal atrial fibrillation) ([name]) 05/05/2021   COPD (chronic obstructive pulmonary disease) ([name]) 05/05/2021   OSA (obstructive sleep apnea) 05/05/2021   Stage 3b chronic kidney disease ([name]) 05/05/2021   Pressure injury of skin 05/04/2021   Diabetic nephropathy ([name]) 01/02/2021   Low back pain 06/01/2020   Hyperlipidemia 04/03/2020   Peripheral artery disease 12/26/2019   NICM (nonischemic cardiomyopathy) (  [name]) 06/20/2019   Non-healing ulcer ([name]) 06/20/2019   Acute CHF (congestive heart failure) ([name]) 11/26/2018   Coagulation disorder 08/09/2017   Depression 07/21/2017   At risk for adverse drug reaction 06/20/2017   Peripheral neuropathy 06/20/2017   S/P transmetatarsal amputation of foot, right ([name]) 06/05/2017   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) 05/19/2017   Obesity, class 2 02/24/2016    Anticoagulation management encounter 02/10/2016   Chronic sinus bradycardia 01/12/2016   Essential hypertension 12/22/2015   Demand ischemia ([name])    Acute on chronic diastolic CHF (congestive heart failure) ([name])    Symptomatic anemia 11/08/2015   Hypokalemia 11/08/2015   Tobacco abuse 10/23/2015   Coronary artery disease    DOE (dyspnea on exertion) 04/29/2015   Paroxysmal atrial fibrillation ([name]) 01/16/2015   Carotid artery stenosis 01/16/2015   Insomnia 02/03/2014   S/P peripheral artery angioplasty - TurboHawk atherectomy; R SFA 09/11/2013    Class: Acute   Leg pain, bilateral 08/19/2013   Hypothyroidism 07/31/2013   History of cocaine abuse ([name]) 06/13/2013   Long term current use of anticoagulant therapy 05/20/2013   Alcohol abuse    Narcotic abuse ([name])    Marijuana abuse    Alcoholic cirrhosis ([name])    Insulin  dependent type 2 diabetes mellitus ([name])    Current Medications[1] Allergies[2]    Social History   Socioeconomic History   Marital status: Single    Spouse name: Not on file   Number of children: 1   Years of education: 12   Highest education level: 12th grade  Occupational History   Occupation: disabled  Tobacco Use   Smoking status: Every Day    Current packs/day: 1.00    Average packs/day: 1 pack/day for 44.0 years (44.0 ttl pk-yrs)    Types: E-cigarettes, Cigarettes   Smokeless tobacco: Former    Types: Snuff  Vaping Use   Vaping status: Former   Devices: 11/26/2018 stopped months ago  Substance and Sexual Activity   Alcohol use: Not Currently    Comment: occ   Drug use: Yes    Types: Crack cocaine, Marijuana, Oxycodone    Sexual activity: Not Currently  Other Topics Concern   Not on file  Social History Narrative   ** Merged History Encounter **       Lives in [place], in motel with sister.  They are looking to move but don't have a place to go yet.     Social Drivers of Health   Tobacco Use: High Risk (11/26/2024)   Patient History     Smoking Tobacco Use: Every Day    Smokeless Tobacco Use: Former    Passive Exposure: Not on Actuary Strain: Low Risk (02/09/2023)   Overall Financial Resource Strain (CARDIA)    Difficulty of Paying Living Expenses: Not hard at all  Food Insecurity: No Food Insecurity (10/05/2024)   Epic    Worried About Programme Researcher, Broadcasting/film/video in the Last Year: Never true    Ran Out of Food in the Last Year: Never true  Transportation Needs: Unmet Transportation Needs (10/05/2024)   Epic    Lack of Transportation (Medical): Yes    Lack of Transportation (Non-Medical): Yes  Physical Activity: Inactive (02/09/2023)   Exercise Vital Sign    Days of Exercise per Week: 0 days    Minutes of Exercise per Session: 0 min  Stress: No Stress Concern Present (02/09/2023)   [employer] -  Occupational Stress Questionnaire    Feeling of Stress : Not at all  Social Connections: Unknown (08/21/2023)   Received from [facility]   Social Network    Social Network: Not on file  Intimate Partner Violence: Not At Risk (10/05/2024)   Epic    Fear of Current or Ex-Partner: No    Emotionally Abused: No    Physically Abused: No    Sexually Abused: No  Depression (PHQ2-9): Low Risk (02/09/2023)   Depression (PHQ2-9)    PHQ-2 Score: 0  Alcohol Screen: Not on file  Housing: Low Risk (10/05/2024)   Epic    Unable to Pay for Housing in the Last Year: No    Number of Times Moved in the Last Year: 0    Homeless in the Last Year: No  Utilities: Not At Risk (10/05/2024)   Epic    Threatened with loss of utilities: No  Health Literacy: Not on file    Physical Exam      Future Appointments  Date Time Provider Department Center  01/14/2025 11:00 AM Dartha Ernst, MD [facility] None       [name], Paramedic [phone] [facility] Paramedic  12/19/24     [1]  Current Outpatient Medications:    acetaminophen  (TYLENOL ) 500 MG tablet, Take 1,000 mg by  mouth every 6 (six) hours as needed for headache or moderate pain (pain score 4-6)., Disp: , Rfl:    albuterol  (VENTOLIN  HFA) 108 (90 Base) MCG/ACT inhaler, USE 2 PUFFS BY MOUTH EVERY FOUR HOURS, AS NEEDED, FOR COUGHING/WHEEZING, Disp: 8.5 g, Rfl: 0   Alcohol Swabs (ALCOHOL PREP) 70 % PADS, USE TO CLEAN SKIN *NEW PRESCRIPTION REQUEST*, Disp: 100 each, Rfl: 10   allopurinol  (ZYLOPRIM ) 100 MG tablet, Take 50 mg by mouth daily. (Patient taking differently: Take 100 mg by mouth daily.), Disp: , Rfl:    amiodarone  (PACERONE ) 200 MG tablet, TAKE 1 TABLET BY MOUTH ONCE DAILY *NEW PRESCRIPTION REQUEST*, Disp: 90 tablet, Rfl: 3   ascorbic acid (VITAMIN C) 500 MG tablet, Take 500 mg by mouth daily., Disp: , Rfl:    budesonide -formoterol  (SYMBICORT ) 160-4.5 MCG/ACT inhaler, Inhale 2 puffs into the lungs 2 (two) times daily., Disp: 1 each, Rfl: 2   buPROPion  (WELLBUTRIN  XL) 300 MG 24 hr tablet, Take 300 mg by mouth daily. (Patient not taking: Reported on 12/02/2024), Disp: , Rfl:    Cholecalciferol  (VITAMIN D3) 50 MCG (2000 UT) capsule, Take 2,000 Units by mouth every morning., Disp: , Rfl:    Continuous Glucose Receiver (DEXCOM G7 RECEIVER) DEVI, 1 Device by Does not apply route continuous., Disp: 1 each, Rfl: 0   Continuous Glucose Receiver (FREESTYLE LIBRE 3 READER) DEVI, 1 Device by Does not apply route continuous., Disp: 1 each, Rfl: 0   Continuous Glucose Sensor (DEXCOM G7 SENSOR) MISC, 1 Device by Does not apply route continuous., Disp: 9 each, Rfl: 3   docusate sodium  (COLACE) 100 MG capsule, Take 2 capsules (200 mg total) by mouth at bedtime., Disp: 10 capsule, Rfl: 0   ELIQUIS  5 MG TABS tablet, TAKE ONE (1) TABLET BY MOUTH TWICE DAILY *NEW PRESCRIPTION REQUEST*, Disp: 180 tablet, Rfl: 10   folic acid  (FOLVITE ) 1 MG tablet, Take 1 tablet (1 mg total) by mouth daily., Disp: 30 tablet, Rfl: 0   Glucagon  (BAQSIMI  ONE PACK) 3 MG/DOSE POWD, Place 1 Device into the nose as needed (Low blood sugar with impaired  consciousness)., Disp: 2 each, Rfl: 3   insulin  glargine (LANTUS ) 100 UNIT/ML  injection, Inject 52 Units into the skin at bedtime., Disp: , Rfl:    insulin  lispro (HUMALOG ) 100 UNIT/ML KwikPen, Inject 8 Units into the skin 3 (three) times daily with meals. Plus sliding scale, max dose 70 units /day (Patient taking differently: Inject 30 Units into the skin 3 (three) times daily with meals. + sliding scale), Disp: , Rfl:    isosorbide -hydrALAZINE  (BIDIL ) 20-37.5 MG tablet, TAKE 1 TABLET BY MOUTH THREE TIMES DAILY *NEW PRESCRIPTION REQUEST*, Disp: 270 tablet, Rfl: 10   levothyroxine  (SYNTHROID ) 50 MCG tablet, TAKE 1 TABLET (50 MCG TOTAL) BY MOUTH DAILY BEFORE BREAKFAST (AM), Disp: 30 tablet, Rfl: 0   Menthol -Camphor (ICY HOT PRO NO MESS EX), Apply 1 Application topically 3 (three) times daily as needed (arms, neuropathy lower extrimity, gout in hand.)., Disp: , Rfl:    metolazone  (ZAROXOLYN ) 2.5 MG tablet, Take 2.5 mg by mouth daily. (Patient not taking: Reported on 12/02/2024), Disp: , Rfl:    metoprolol  succinate (TOPROL -XL) 25 MG 24 hr tablet, Take 1 tablet (25 mg total) by mouth daily., Disp: 60 tablet, Rfl: 0   metoprolol  succinate (TOPROL -XL) 50 MG 24 hr tablet, Take 25 mg by mouth daily. Take with or immediately following a meal. (Patient not taking: Reported on 12/02/2024), Disp: , Rfl:    NYAMYC  powder, Apply 1 Application topically 2 (two) times daily as needed (for skin folds)., Disp: , Rfl:    nystatin  cream (MYCOSTATIN ), Apply 1 Application topically 2 (two) times daily as needed (for skin folds)., Disp: , Rfl:    Omega-3 Fatty Acids (FISH OIL PO), Take 1 capsule by mouth daily., Disp: , Rfl:    pantoprazole  (PROTONIX ) 40 MG tablet, TAKE 1 TABLET (40 MG TOTAL) BY MOUTH DAILY(AM) (Patient not taking: Reported on 12/02/2024), Disp: 30 tablet, Rfl: 0   pantoprazole  (PROTONIX ) 40 MG tablet, Take 1 tablet (40 mg total) by mouth daily., Disp: 30 tablet, Rfl: 0   polyethylene glycol (MIRALAX  /  GLYCOLAX ) 17 g packet, Take 17 g by mouth daily as needed for moderate constipation., Disp: 14 each, Rfl: 0   potassium chloride  SA (KLOR-CON  M) 20 MEQ tablet, Take 2 tablets (40 mEq total) by mouth 2 (two) times daily., Disp: 200 tablet, Rfl: 3   rosuvastatin  (CRESTOR ) 40 MG tablet, TAKE 1 TABLET BY MOUTH DAILY *NEW PRESCRIPTION REQUEST* (Patient taking differently: Take 40 mg by mouth at bedtime.), Disp: 90 tablet, Rfl: 10   tirzepatide  (MOUNJARO ) 7.5 MG/0.5ML Pen, INJECT 7.5 MG SUBCUTANEOUSLY ONCE WEEKLY *NEW PRESCRIPTION REQUEST*, Disp: 6 mL, Rfl: 0   torsemide  (DEMADEX ) 20 MG tablet, Take 4 tablets (80 mg total) by mouth 2 (two) times daily., Disp: 540 tablet, Rfl: 10   VITAMIN E PO, Take 1 tablet by mouth daily., Disp: , Rfl:  [2]  Allergies Allergen Reactions   Neurontin  [Gabapentin ] Nausea And Vomiting and Other (See Comments)    POSSIBLE SHAKING   Lyrica  [Pregabalin ] Other (See Comments)    Shaking

## 2024-12-19 ENCOUNTER — Ambulatory Visit: Admitting: Pulmonary Disease

## 2024-12-19 ENCOUNTER — Emergency Department (HOSPITAL_COMMUNITY)

## 2024-12-19 ENCOUNTER — Other Ambulatory Visit: Payer: Self-pay

## 2024-12-19 ENCOUNTER — Emergency Department (HOSPITAL_COMMUNITY)
Admission: EM | Admit: 2024-12-19 | Discharge: 2024-12-19 | Disposition: A | Discharge status: Home / Self Care | Source: Ambulatory Visit | Attending: Emergency Medicine | Admitting: Emergency Medicine

## 2024-12-19 ENCOUNTER — Encounter (HOSPITAL_COMMUNITY): Payer: Self-pay

## 2024-12-19 ENCOUNTER — Telehealth: Payer: Self-pay | Admitting: Acute Care

## 2024-12-19 DIAGNOSIS — M79605 Pain in left leg: Secondary | ICD-10-CM

## 2024-12-19 DIAGNOSIS — I739 Peripheral vascular disease, unspecified: Secondary | ICD-10-CM

## 2024-12-19 LAB — CBC WITH DIFFERENTIAL/PLATELET
Abs Immature Granulocytes: 0.07 10*3/uL (ref 0.00–0.07)
Basophils Absolute: 0 10*3/uL (ref 0.0–0.1)
Basophils Relative: 0 %
Eosinophils Absolute: 0.1 10*3/uL (ref 0.0–0.5)
Eosinophils Relative: 1 %
HCT: 33.2 % — ABNORMAL LOW (ref 36.0–46.0)
Hemoglobin: 10.4 g/dL — ABNORMAL LOW (ref 12.0–15.0)
Immature Granulocytes: 1 %
Lymphocytes Relative: 7 %
Lymphs Abs: 0.9 10*3/uL (ref 0.7–4.0)
MCH: 30.1 pg (ref 26.0–34.0)
MCHC: 31.3 g/dL (ref 30.0–36.0)
MCV: 96.2 fL (ref 80.0–100.0)
Monocytes Absolute: 1.1 10*3/uL — ABNORMAL HIGH (ref 0.1–1.0)
Monocytes Relative: 9 %
Neutro Abs: 10.7 10*3/uL — ABNORMAL HIGH (ref 1.7–7.7)
Neutrophils Relative %: 82 %
Platelets: 256 10*3/uL (ref 150–400)
RBC: 3.45 MIL/uL — ABNORMAL LOW (ref 3.87–5.11)
RDW: 15.4 % (ref 11.5–15.5)
WBC: 12.9 10*3/uL — ABNORMAL HIGH (ref 4.0–10.5)
nRBC: 0 % (ref 0.0–0.2)

## 2024-12-19 LAB — BASIC METABOLIC PANEL WITH GFR
Anion gap: 14 (ref 5–15)
BUN: 74 mg/dL — ABNORMAL HIGH (ref 8–23)
CO2: 25 mmol/L (ref 22–32)
Calcium: 9.8 mg/dL (ref 8.9–10.3)
Chloride: 101 mmol/L (ref 98–111)
Creatinine, Ser: 2.69 mg/dL — ABNORMAL HIGH (ref 0.44–1.00)
GFR, Estimated: 19 mL/min — ABNORMAL LOW
Glucose, Bld: 57 mg/dL — ABNORMAL LOW (ref 70–99)
Potassium: 5.1 mmol/L (ref 3.5–5.1)
Sodium: 139 mmol/L (ref 135–145)

## 2024-12-19 LAB — CBG MONITORING, ED: Glucose-Capillary: 92 mg/dL (ref 70–99)

## 2024-12-19 MED ORDER — OXYCODONE HCL 5 MG PO TABS: 5.0000 mg | ORAL_TABLET | Freq: Four times a day (QID) | ORAL | 0 refills | Status: AC | PRN

## 2024-12-19 MED ORDER — FENTANYL CITRATE (PF) 50 MCG/ML IJ SOSY
50.0000 ug | PREFILLED_SYRINGE | Freq: Once | INTRAMUSCULAR | Status: AC
  Administered 2024-12-19: 50 ug via INTRAVENOUS
  Filled 2024-12-19: qty 1

## 2024-12-19 MED ORDER — HYDROCODONE-ACETAMINOPHEN 5-325 MG PO TABS
1.0000 | ORAL_TABLET | Freq: Once | ORAL | Status: AC
  Administered 2024-12-19: 1 via ORAL
  Filled 2024-12-19: qty 1

## 2024-12-19 NOTE — ED Provider Notes (Signed)
 " [facility] Provider Note   CSN: [id number] Arrival date & time: 12/19/24  1212     Patient presents with: Leg Pain   [name] is a 64 y.o. female.   Patient here with left lower leg pain.  Somewhat chronic.  She has had issues with circulation in the past.  She denies any fevers or chills.  She denies any chest pain shortness of breath back pain weakness numbness tingling.  No loss of bowel or bladder.  She has a history of polysubstance abuse.  Heart failure.  Paroxysmal A-fib on Eliquis .  She is having pain on and off the left lower leg.  The history is provided by the patient.       Prior to Admission medications  Medication Sig Start Date End Date Taking? Authorizing Provider  oxyCODONE  (ROXICODONE ) 5 MG immediate release tablet Take 1 tablet (5 mg total) by mouth every 6 (six) hours as needed for up to 10 doses for severe pain (pain score 7-10) or breakthrough pain. 12/19/24  Yes Chaniya Genter, DO  acetaminophen  (TYLENOL ) 500 MG tablet Take 1,000 mg by mouth every 6 (six) hours as needed for headache or moderate pain (pain score 4-6).    [provider]  albuterol  (VENTOLIN  HFA) 108 (90 Base) MCG/ACT inhaler USE 2 PUFFS BY MOUTH EVERY FOUR HOURS, AS NEEDED, FOR COUGHING/WHEEZING 07/12/23   Newlin, [name], MD  Alcohol Swabs (ALCOHOL PREP) 70 % PADS USE TO CLEAN SKIN *NEW PRESCRIPTION REQUEST* 11/12/24   Motwani, Obadiah, MD  allopurinol  (ZYLOPRIM ) 100 MG tablet Take 50 mg by mouth daily. Patient taking differently: Take 100 mg by mouth daily.    [provider]  amiodarone  (PACERONE ) 200 MG tablet TAKE 1 TABLET BY MOUTH ONCE DAILY *NEW PRESCRIPTION REQUEST* 11/11/24   Rolan Ezra GORMAN, MD  ascorbic acid (VITAMIN C) 500 MG tablet Take 500 mg by mouth daily.    [provider]  budesonide -formoterol  (SYMBICORT ) 160-4.5 MCG/ACT inhaler Inhale 2 puffs into the lungs 2 (two) times daily. 07/12/23   Newlin, [name],  MD  buPROPion  (WELLBUTRIN  XL) 300 MG 24 hr tablet Take 300 mg by mouth daily. Patient not taking: Reported on 12/02/2024    [provider]  Cholecalciferol  (VITAMIN D3) 50 MCG (2000 UT) capsule Take 2,000 Units by mouth every morning.    [provider]  Continuous Glucose Receiver (DEXCOM G7 RECEIVER) DEVI 1 Device by Does not apply route continuous. 06/25/24   Motwani, Komal, MD  Continuous Glucose Receiver (FREESTYLE LIBRE 3 READER) DEVI 1 Device by Does not apply route continuous. 11/18/24   Motwani, Komal, MD  Continuous Glucose Sensor (DEXCOM G7 SENSOR) MISC 1 Device by Does not apply route continuous. 06/25/24   Motwani, Komal, MD  docusate sodium  (COLACE) 100 MG capsule Take 2 capsules (200 mg total) by mouth at bedtime. 03/17/24   Regalado, Owen LABOR, MD  ELIQUIS  5 MG TABS tablet TAKE ONE (1) TABLET BY MOUTH TWICE DAILY *NEW PRESCRIPTION REQUEST* 11/11/24   Rolan Ezra GORMAN, MD  folic acid  (FOLVITE ) 1 MG tablet Take 1 tablet (1 mg total) by mouth daily. 03/17/24   Regalado, Belkys A, MD  Glucagon  (BAQSIMI  ONE PACK) 3 MG/DOSE POWD Place 1 Device into the nose as needed (Low blood sugar with impaired consciousness). 05/22/24   Motwani, Komal, MD  insulin  glargine (LANTUS ) 100 UNIT/ML injection Inject 52 Units into the skin at bedtime.    [provider]  insulin  lispro (  HUMALOG ) 100 UNIT/ML KwikPen Inject 8 Units into the skin 3 (three) times daily with meals. Plus sliding scale, max dose 70 units /day Patient taking differently: Inject 30 Units into the skin 3 (three) times daily with meals. + sliding scale 10/12/24   Fairy Frames, MD  isosorbide -hydrALAZINE  (BIDIL ) 20-37.5 MG tablet TAKE 1 TABLET BY MOUTH THREE TIMES DAILY *NEW PRESCRIPTION REQUEST* 11/11/24   Rolan Ezra RAMAN, MD  levothyroxine  (SYNTHROID ) 50 MCG tablet TAKE 1 TABLET (50 MCG TOTAL) BY MOUTH DAILY BEFORE BREAKFAST (AM) 05/02/23   Delbert Clam, MD  Menthol -Camphor (ICY HOT PRO NO MESS EX) Apply 1  Application topically 3 (three) times daily as needed (arms, neuropathy lower extrimity, gout in hand.).    [provider]  metolazone  (ZAROXOLYN ) 2.5 MG tablet Take 2.5 mg by mouth daily. Patient not taking: Reported on 12/02/2024    [provider]  metoprolol  succinate (TOPROL -XL) 25 MG 24 hr tablet Take 1 tablet (25 mg total) by mouth daily. 10/12/24   Fairy Frames, MD  metoprolol  succinate (TOPROL -XL) 50 MG 24 hr tablet Take 25 mg by mouth daily. Take with or immediately following a meal. Patient not taking: Reported on 12/02/2024    [provider]  NYAMYC  powder Apply 1 Application topically 2 (two) times daily as needed (for skin folds). 02/09/24   [provider]  nystatin  cream (MYCOSTATIN ) Apply 1 Application topically 2 (two) times daily as needed (for skin folds). 02/09/24   [provider]  Omega-3 Fatty Acids (FISH OIL PO) Take 1 capsule by mouth daily.    [provider]  pantoprazole  (PROTONIX ) 40 MG tablet TAKE 1 TABLET (40 MG TOTAL) BY MOUTH DAILY(AM) Patient not taking: Reported on 12/02/2024 05/02/23   Newlin, [name], MD  pantoprazole  (PROTONIX ) 40 MG tablet Take 1 tablet (40 mg total) by mouth daily. 10/12/24   Joseph, Preetha, MD  polyethylene glycol (MIRALAX  / GLYCOLAX ) 17 g packet Take 17 g by mouth daily as needed for moderate constipation. 08/05/23   Akula, Vijaya, MD  potassium chloride  SA (KLOR-CON  M) 20 MEQ tablet Take 2 tablets (40 mEq total) by mouth 2 (two) times daily. 11/25/24   Rolan Ezra RAMAN, MD  rosuvastatin  (CRESTOR ) 40 MG tablet TAKE 1 TABLET BY MOUTH DAILY *NEW PRESCRIPTION REQUEST* Patient taking differently: Take 40 mg by mouth at bedtime. 11/11/24   Rolan Ezra RAMAN, MD  tirzepatide  (MOUNJARO ) 7.5 MG/0.5ML Pen INJECT 7.5 MG SUBCUTANEOUSLY ONCE WEEKLY *NEW PRESCRIPTION REQUEST* 11/12/24   Motwani, Obadiah, MD  torsemide  (DEMADEX ) 20 MG tablet Take 4 tablets (80 mg total) by mouth 2 (two) times daily. 11/18/24    Glena Harlene HERO, FNP  VITAMIN E PO Take 1 tablet by mouth daily.    [provider]  tiotropium (SPIRIVA  HANDIHALER) 18 MCG inhalation capsule Place 1 capsule (18 mcg total) into inhaler and inhale every morning. 12/09/19 02/09/21  Sebastian Toribio GAILS, MD    Allergies: Neurontin  [gabapentin ] and Lyrica  [pregabalin ]    Review of Systems  Updated Vital Signs BP (!) 175/56 (BP Location: Right Arm)   Pulse 67   Temp 97.6 F (36.4 C)   Resp 16   Ht 5' 3 (1.6 m)   Wt 98.4 kg   LMP  (LMP Unknown)   SpO2 100%   BMI 38.44 kg/m   Physical Exam Vitals and nursing note reviewed.  Constitutional:      General: She is not in acute distress.    Appearance: She is well-developed. She is not ill-appearing.  HENT:     Head: Normocephalic and atraumatic.     Nose: Nose normal.  Eyes:     Extraocular Movements: Extraocular movements intact.     Conjunctiva/sclera: Conjunctivae normal.     Pupils: Pupils are equal, round, and reactive to light.  Cardiovascular:     Rate and Rhythm: Normal rate and regular rhythm.     Heart sounds: No murmur heard. Pulmonary:     Effort: Pulmonary effort is normal. No respiratory distress.     Breath sounds: Normal breath sounds.  Abdominal:     Palpations: Abdomen is soft.     Tenderness: There is no abdominal tenderness.  Musculoskeletal:        General: No swelling or tenderness.     Cervical back: Neck supple.     Comments: No reproducible tenderness in the left lower leg or low back on exam  Skin:    General: Skin is warm and dry.     Capillary Refill: Capillary refill takes less than 2 seconds.     Findings: No erythema or rash.  Neurological:     General: No focal deficit present.     Mental Status: She is alert and oriented to person, place, and time.     Cranial Nerves: No cranial nerve deficit.     Sensory: No sensory deficit.     Motor: No weakness.     Coordination: Coordination normal.  Psychiatric:        Mood and  Affect: Mood normal.     (all labs ordered are listed, but only abnormal results are displayed) Labs Reviewed  BASIC METABOLIC PANEL WITH GFR - Abnormal; Notable for the following components:      Result Value   Glucose, Bld 57 (*)    BUN 74 (*)    Creatinine, Ser 2.69 (*)    GFR, Estimated 19 (*)    All other components within normal limits  CBC WITH DIFFERENTIAL/PLATELET - Abnormal; Notable for the following components:   WBC 12.9 (*)    RBC 3.45 (*)    Hemoglobin 10.4 (*)    HCT 33.2 (*)    Neutro Abs 10.7 (*)    Monocytes Absolute 1.1 (*)    All other components within normal limits  CBG MONITORING, ED    EKG: None  Radiology: VAS US  ABI WITH/WO TBI Result Date: 12/19/2024  LOWER EXTREMITY DOPPLER STUDY Patient Name:  [name]  Date of Exam:   12/19/2024 Medical Rec #: [id number]      Accession #:    [id number] Date of Birth: 08/17/1961     Patient Gender: F Patient Age:   57 years Exam Location:  [facility] Procedure:      VAS US  ABI WITH/WO TBI Referring Phys: ROBERT LOCKWOOD --------------------------------------------------------------------------------  Indications: Claudication, rest pain, and peripheral artery disease. Chest pain,              osteomyelitis. right transmetatarsal amputation High Risk         Hypertension, hyperlipidemia, Diabetes, coronary artery Factors:          disease.  Vascular Interventions: 08/11/23: Right femoropopliteal stent. 06/07/21: PTA right                         SFA CTO, placement of spider distal protection device in  right above-the-knee popliteal artery. Hawk 1                         directional atherectomy followed by drug-coated balloon                         angioplasty right SFA CTO, completion angiography. By                         Dr. Court 06/28/19: Left CFA to ATA bypass graft with                         vein. 01/15/15: Right SFA atherectomy and DCBA angioplasty                         by Dr. Court.  09/10/13: Right SFA atherectomy by Dr.                         Court. History of right TMA 8.7.2018. Comparison Study: Previous study on 5.27.2025. Performing Technologist: Edilia [facility] Appl  Examination Guidelines: A complete evaluation includes at minimum, Doppler waveform signals and systolic blood pressure reading at the level of bilateral brachial, anterior tibial, and posterior tibial arteries, when vessel segments are accessible. Bilateral testing is considered an integral part of a complete examination. Photoelectric Plethysmograph (PPG) waveforms and toe systolic pressure readings are included as required and additional duplex testing as needed. Limited examinations for reoccurring indications may be performed as noted.  ABI Findings: +---------+------------------+-----+----------+----------+ Right    Rt Pressure (mmHg)IndexWaveform  Comment    +---------+------------------+-----+----------+----------+ Brachial 173                    triphasic            +---------+------------------+-----+----------+----------+ PTA      106               0.61 biphasic             +---------+------------------+-----+----------+----------+ DP       101               0.58 monophasic           +---------+------------------+-----+----------+----------+ Great Toe                                 Amputated. +---------+------------------+-----+----------+----------+ +---------+------------------+-----+---------+-------+ Left     Lt Pressure (mmHg)IndexWaveform Comment +---------+------------------+-----+---------+-------+ Brachial 159                    triphasic        +---------+------------------+-----+---------+-------+ PTA      153               0.88 biphasic         +---------+------------------+-----+---------+-------+ DP       174               1.01 triphasic        +---------+------------------+-----+---------+-------+ Great Toe102               0.59 Normal            +---------+------------------+-----+---------+-------+ +-------+-----------+-----------+------------+------------+ ABI/TBIToday's ABIToday's TBIPrevious ABIPrevious TBI +-------+-----------+-----------+------------+------------+ Right  0.61       Amp.                                +-------+-----------+-----------+------------+------------+  Left   1.01       0.59                                +-------+-----------+-----------+------------+------------+  Summary: Right: Resting right ankle-brachial index indicates moderate right lower extremity arterial disease.  Right TMA. Left: Resting left ankle-brachial index is within normal range. The left toe-brachial index is abnormal.  *See table(s) above for measurements and observations.     Preliminary      Procedures   Medications Ordered in the ED  HYDROcodone -acetaminophen  (NORCO/VICODIN) 5-325 MG per tablet 1 tablet (1 tablet Oral Given 12/19/24 1937)  fentaNYL  (SUBLIMAZE ) injection 50 mcg (50 mcg Intravenous Given 12/19/24 2046)                                    Medical Decision Making Risk Prescription drug management.   [name] is here with acute on chronic left lower leg pain.  Pain seems to be worse when she is walking.  She has had some vascular issues in the past.  She denies any chest pain shortness of breath fever chills.  She has not noticed any worsening redness swelling in the legs.  History of heart failure paroxysmal A-fib on blood thinner.  Overall she has got palpable pulses on exam confirmed with Doppler.  Her pain is not really reproducible she describes more neuropathic type pain.  She is on gabapentin  for similar.  Right now she had ABIs already done in triage that are showing some disease in the right lower leg which is chronic.  Maybe some mild disease in the left lower leg but she has got palpable pulses.  She does not need any acute vascular management at this time.  Creatinine a little bit above her  baseline but she has chronic CKD.  She has no significant leukocytosis anemia or electrolyte abnormality otherwise.  At this time we will prescribe narcotic pain medicine breakthrough pain.  She has got issues with Lyrica .  She cannot take ibuprofen  because of chronic kidney disease.  Will do a short course of narcotic pain medicine for breakthrough pain.  She does haveChronic liver disease as well.  She does have a remote history of polysubstance abuse.  She has taken narcotics in the past for pain management for different issues without any major issues.  I feel comfortable giving her a short prescription for breakthrough pain and have her follow-up with vascular surgery.  Discharged in good condition.  She will follow-up with primary care to recheck her kidney function.  She understands return if symptoms worsen.  But right now I do not have any concern for cauda equina or spinal process or any other acute abdominal or vascular process.  No concern for DVT.  Patient is already on anticoagulation.  This chart was dictated using voice recognition software.  Despite best efforts to proofread,  errors can occur which can change the documentation meaning.      Final diagnoses:  Pain of left lower extremity    ED Discharge Orders          Ordered    oxyCODONE  (ROXICODONE ) 5 MG immediate release tablet  Every 6 hours PRN        12/19/24 2221               Ruthe Cornet, DO 12/19/24 2223  "

## 2024-12-19 NOTE — Telephone Encounter (Signed)
 Patient No-Showed for LDCT on 12/18/2024. Called and left VM for pt to call back to reschedule.

## 2024-12-19 NOTE — ED Triage Notes (Signed)
 BIB family from home for L foot pain, radiates up to L hip. H/o ischemic foot. Onset 2d ago. Concern for similar problems. Pt alert, NAD, calm, interactive, resps e/u, speaking in clear complete sentences. Sitting in w/c.

## 2024-12-19 NOTE — ED Provider Triage Note (Signed)
 Emergency Medicine Provider Triage Evaluation Note  [name] , a 64 y.o. female  was evaluated in triage.  Pt complains of left lower extremity pain.  Review of Systems  Positive: Left lower extremity pain Negative: Chest pain, dyspnea  Physical Exam  BP (!) 148/78   Pulse 62   Temp 98.3 F (36.8 C) (Oral)   Resp 18   Ht 1.6 m (5' 3)   Wt 98.4 kg   LMP  (LMP Unknown)   SpO2 98%   BMI 38.44 kg/m  Gen:   Awake, no distress adult female speaking clearly Resp:  Normal effort no increased work of breathing MSK:   Moves extremities without difficulty left lower extremity with minimal pulses palpable dorsalis pedis  Medical Decision Making  Medically screening exam initiated at 2:26 PM.  Appropriate orders placed.  [name] was informed that the remainder of the evaluation will be completed by another provider, this initial triage assessment does not replace that evaluation, and the importance of remaining in the ED until their evaluation is complete.   Garrick Charleston, MD 12/19/24 1426

## 2024-12-19 NOTE — Discharge Instructions (Addendum)
 Follow-up with your primary care doctor for further pain management follow-up with vascular surgery.  Take narcotic pain medicine for breakthrough pain.  Workup today was reassuring.  Return if you develop fever or worsening symptoms.  Have your kidney function rechecked by your primary care doctor as this is mildly elevated from your baseline.

## 2024-12-19 NOTE — Progress Notes (Signed)
 Ankle Brachial Index has been completed.  Results can be found in chart review under CV Proc.  12/19/2024 6:41 PM  Viyan Rosamond [facility] Appl, RVT.

## 2024-12-20 LAB — VAS US ABI WITH/WO TBI
Left ABI: 1.01
Right ABI: 0.61

## 2024-12-31 ENCOUNTER — Other Ambulatory Visit

## 2025-01-14 ENCOUNTER — Ambulatory Visit: Admitting: "Endocrinology

## 2025-05-14 ENCOUNTER — Ambulatory Visit: Admitting: Pulmonary Disease
